# Patient Record
Sex: Male | Born: 1965 | Race: Black or African American | Hispanic: No | Marital: Single | Smoking: Never smoker
Health system: Southern US, Community
[De-identification: ages and names within clinical notes are randomized; demographics above are authoritative.]

## PROBLEM LIST (undated history)

## (undated) DIAGNOSIS — E669 Obesity, unspecified: Secondary | ICD-10-CM

## (undated) DIAGNOSIS — K219 Gastro-esophageal reflux disease without esophagitis: Secondary | ICD-10-CM

## (undated) DIAGNOSIS — I639 Cerebral infarction, unspecified: Secondary | ICD-10-CM

## (undated) DIAGNOSIS — J45909 Unspecified asthma, uncomplicated: Secondary | ICD-10-CM

## (undated) DIAGNOSIS — I10 Essential (primary) hypertension: Secondary | ICD-10-CM

## (undated) DIAGNOSIS — E785 Hyperlipidemia, unspecified: Secondary | ICD-10-CM

## (undated) HISTORY — PX: BRAIN SURGERY: SHX531

---

## 2003-09-20 ENCOUNTER — Other Ambulatory Visit: Payer: Self-pay

## 2005-06-30 ENCOUNTER — Emergency Department: Payer: Self-pay | Admitting: Emergency Medicine

## 2005-11-15 ENCOUNTER — Emergency Department: Payer: Self-pay | Admitting: Emergency Medicine

## 2006-10-03 ENCOUNTER — Emergency Department: Payer: Self-pay | Admitting: Emergency Medicine

## 2006-10-03 ENCOUNTER — Other Ambulatory Visit: Payer: Self-pay

## 2008-09-23 ENCOUNTER — Emergency Department: Payer: Self-pay

## 2008-11-23 ENCOUNTER — Encounter: Payer: Self-pay | Admitting: Internal Medicine

## 2008-12-07 ENCOUNTER — Encounter: Payer: Self-pay | Admitting: Internal Medicine

## 2009-01-06 ENCOUNTER — Encounter: Payer: Self-pay | Admitting: Internal Medicine

## 2010-08-25 ENCOUNTER — Emergency Department: Payer: Self-pay | Admitting: Emergency Medicine

## 2011-06-06 ENCOUNTER — Emergency Department: Payer: Self-pay | Admitting: Emergency Medicine

## 2011-12-20 ENCOUNTER — Emergency Department: Payer: Self-pay | Admitting: Emergency Medicine

## 2012-07-31 ENCOUNTER — Ambulatory Visit: Payer: Self-pay | Admitting: Unknown Physician Specialty

## 2013-01-03 DIAGNOSIS — R7303 Prediabetes: Secondary | ICD-10-CM | POA: Insufficient documentation

## 2013-01-03 DIAGNOSIS — N529 Male erectile dysfunction, unspecified: Secondary | ICD-10-CM | POA: Insufficient documentation

## 2013-09-08 DIAGNOSIS — R221 Localized swelling, mass and lump, neck: Secondary | ICD-10-CM | POA: Insufficient documentation

## 2013-09-08 DIAGNOSIS — H60339 Swimmer's ear, unspecified ear: Secondary | ICD-10-CM | POA: Insufficient documentation

## 2014-01-06 DIAGNOSIS — E119 Type 2 diabetes mellitus without complications: Secondary | ICD-10-CM | POA: Insufficient documentation

## 2014-01-07 DIAGNOSIS — E010 Iodine-deficiency related diffuse (endemic) goiter: Secondary | ICD-10-CM | POA: Insufficient documentation

## 2014-05-04 ENCOUNTER — Ambulatory Visit: Payer: Self-pay | Admitting: Emergency Medicine

## 2014-06-13 DIAGNOSIS — I6930 Unspecified sequelae of cerebral infarction: Secondary | ICD-10-CM | POA: Insufficient documentation

## 2014-06-13 DIAGNOSIS — E782 Mixed hyperlipidemia: Secondary | ICD-10-CM | POA: Insufficient documentation

## 2014-06-13 DIAGNOSIS — I10 Essential (primary) hypertension: Secondary | ICD-10-CM | POA: Insufficient documentation

## 2014-08-03 DIAGNOSIS — W19XXXA Unspecified fall, initial encounter: Secondary | ICD-10-CM | POA: Insufficient documentation

## 2014-11-24 DIAGNOSIS — K219 Gastro-esophageal reflux disease without esophagitis: Secondary | ICD-10-CM | POA: Insufficient documentation

## 2014-11-24 DIAGNOSIS — D496 Neoplasm of unspecified behavior of brain: Secondary | ICD-10-CM | POA: Insufficient documentation

## 2015-01-03 ENCOUNTER — Emergency Department: Payer: Medicare HMO

## 2015-01-03 ENCOUNTER — Emergency Department
Admission: EM | Admit: 2015-01-03 | Discharge: 2015-01-03 | Disposition: A | Discharge status: Home / Self Care | Payer: Medicare HMO | Attending: Emergency Medicine | Admitting: Emergency Medicine

## 2015-01-03 DIAGNOSIS — Y939 Activity, unspecified: Secondary | ICD-10-CM | POA: Insufficient documentation

## 2015-01-03 DIAGNOSIS — Y999 Unspecified external cause status: Secondary | ICD-10-CM | POA: Insufficient documentation

## 2015-01-03 DIAGNOSIS — X58XXXA Exposure to other specified factors, initial encounter: Secondary | ICD-10-CM | POA: Insufficient documentation

## 2015-01-03 DIAGNOSIS — I10 Essential (primary) hypertension: Secondary | ICD-10-CM | POA: Diagnosis not present

## 2015-01-03 DIAGNOSIS — S39012A Strain of muscle, fascia and tendon of lower back, initial encounter: Secondary | ICD-10-CM | POA: Diagnosis not present

## 2015-01-03 DIAGNOSIS — M545 Low back pain: Secondary | ICD-10-CM | POA: Diagnosis present

## 2015-01-03 DIAGNOSIS — Y929 Unspecified place or not applicable: Secondary | ICD-10-CM | POA: Diagnosis not present

## 2015-01-03 LAB — URINALYSIS COMPLETE WITH MICROSCOPIC (ARMC ONLY)
Bacteria, UA: NONE SEEN
Bilirubin Urine: NEGATIVE
Glucose, UA: NEGATIVE mg/dL
Hgb urine dipstick: NEGATIVE
Ketones, ur: NEGATIVE mg/dL
Leukocytes, UA: NEGATIVE
Nitrite: NEGATIVE
Protein, ur: NEGATIVE mg/dL
Specific Gravity, Urine: 1.016 (ref 1.005–1.030)
pH: 7 (ref 5.0–8.0)

## 2015-01-03 MED ORDER — CYCLOBENZAPRINE HCL 10 MG PO TABS: 10.0000 mg | ORAL_TABLET | Freq: Three times a day (TID) | ORAL | Status: AC | PRN

## 2015-01-03 MED ORDER — TRAMADOL HCL 50 MG PO TABS: 50.0000 mg | ORAL_TABLET | Freq: Four times a day (QID) | ORAL | Status: DC | PRN

## 2015-01-03 MED ORDER — TRAMADOL HCL 50 MG PO TABS
50.0000 mg | ORAL_TABLET | Freq: Once | ORAL | Status: AC
  Administered 2015-01-03: 50 mg via ORAL
  Filled 2015-01-03: qty 1

## 2015-01-03 MED ORDER — CYCLOBENZAPRINE HCL 10 MG PO TABS
10.0000 mg | ORAL_TABLET | Freq: Once | ORAL | Status: AC
  Administered 2015-01-03: 10 mg via ORAL
  Filled 2015-01-03: qty 1

## 2015-01-03 NOTE — ED Provider Notes (Signed)
[facility] Emergency Department Provider Note  ____________________________________________  Time seen: Approximately 1:35 PM  I have reviewed the triage vital signs and the nursing notes.   HISTORY  Chief Complaint Back Pain    HPI Curtis Powell is a 49 y.o. male complain of low back pain for 3 days. Patient also complaining of right flank pain. Patient denies any bladder or bowel dysfunction. He denies any radicular component to this pain. The patient denies any injury. Patient rating his pain as 8/10 described as sharp. No palliative measures taken for this complaint.   History reviewed. No pertinent past medical history.  There are no active problems to display for this patient.   Past Surgical History  Procedure Laterality Date  . Brain surgery      Current Outpatient Rx  Name  Route  Sig  Dispense  Refill  . cyclobenzaprine (FLEXERIL) 10 MG tablet   Oral   Take 1 tablet (10 mg total) by mouth every 8 (eight) hours as needed for muscle spasms.   15 tablet   0   . traMADol (ULTRAM) 50 MG tablet   Oral   Take 1 tablet (50 mg total) by mouth every 6 (six) hours as needed for moderate pain.   12 tablet   0     Allergies Review of patient's allergies indicates no known allergies.  No family history on file.  Social History Social History  Substance Use Topics  . Smoking status: Never Smoker   . Smokeless tobacco: Never Used  . Alcohol Use: No    Review of Systems Constitutional: No fever/chills Eyes: No visual changes. ENT: No sore throat. Cardiovascular: Denies chest pain. Respiratory: Denies shortness of breath. Gastrointestinal: No abdominal pain.  No nausea, no vomiting.  No diarrhea.  No constipation. Genitourinary: Negative for dysuria. Musculoskeletal: Positive for back pain. Skin: Negative for rash. Neurological: Negative for headaches, focal weakness or numbness. Endocrine:Hypertension  10-point ROS otherwise  negative.  ____________________________________________   PHYSICAL EXAM:  VITAL SIGNS: ED Triage Vitals  Enc Vitals Group     BP 01/03/15 1309 130/76 mmHg     Pulse Rate 01/03/15 1309 88     Resp 01/03/15 1309 16     Temp 01/03/15 1309 98.7 F (37.1 C)     Temp Source 01/03/15 1309 Oral     SpO2 01/03/15 1309 96 %     Weight 01/03/15 1309 196 lb (88.905 kg)     Height 01/03/15 1309 4\' 11"  (1.499 m)     Head Cir --      Peak Flow --      Pain Score 01/03/15 1309 8     Pain Loc --      Pain Edu? --      Excl. in [place]? --     Constitutional: Alert and oriented. Well appearing and in no acute distress. Eyes: Conjunctivae are normal. PERRL. EOMI. Head: Atraumatic. Nose: No congestion/rhinnorhea. Mouth/Throat: Mucous membranes are moist.  Oropharynx non-erythematous. Neck: No stridor.   Hematological/Lymphatic/Immunilogical: No cervical lymphadenopathy. Cardiovascular: Normal rate, regular rhythm. Grossly normal heart sounds.  Good peripheral circulation. Respiratory: Normal respiratory effort.  No retractions. Lungs CTAB. Gastrointestinal: Soft and nontender. No distention. No abdominal bruits. No CVA tenderness. Genitourinary:  **}Musculoskeletal: No lower extremity tenderness nor edema.  No joint effusions. Neurologic:  Normal speech and language. No gross focal neurologic deficits are appreciated. No gait instability. Skin:  Skin is warm, dry and intact. No rash noted. Psychiatric: Mood and  affect are normal. Speech and behavior are normal.  ____________________________________________   LABS (all labs ordered are listed, but only abnormal results are displayed)  Labs Reviewed  URINALYSIS COMPLETEWITH MICROSCOPIC ([facility] ONLY) - Abnormal; Notable for the following:    Color, Urine AMBER (*)    APPearance HAZY (*)    Squamous Epithelial / LPF 0-5 (*)    All other components within normal limits    ____________________________________________  EKG   ____________________________________________  RADIOLOGY degenerative findings throughout the L-spine. No other acute findings. I, Sable Feil, personally viewed and evaluated these images (plain radiographs) as part of my medical decision making.   ____________________________________________   PROCEDURES  Procedure(s) performed: None  Critical Care performed: No  ____________________________________________   INITIAL IMPRESSION / ASSESSMENT AND PLAN / ED COURSE  Pertinent labs & imaging results that were available during my care of the patient were reviewed by me and considered in my medical decision making (see chart for details). Discussed x-ray findings with the patient. Lumbar strain. Patient given a prescription for Flexeril, ibuprofen and tramadol. Patient advised to follow-up with family doctor for continued care. Return back if his condition worsens ____________________________________________   FINAL CLINICAL IMPRESSION(S) / ED DIAGNOSES  Final diagnoses:  Lumbar strain, initial encounter      Sable Feil, PA-C 01/03/15 [place], MD 01/03/15 1600

## 2015-01-03 NOTE — ED Notes (Signed)
Pt comes into the ED via EMS from home with c/o right lower back pain, when asked pt points to lower back, for the past 3-4 days..states he has been lifting some wts.

## 2015-01-03 NOTE — ED Notes (Signed)
Developed lower back pain  Mainly on right side at hip area .non radiating  .denies any injury

## 2016-05-01 ENCOUNTER — Inpatient Hospital Stay
Admission: EM | Admit: 2016-05-01 | Discharge: 2016-05-03 | DRG: 065 | Disposition: A | Discharge status: Skilled Nursing Facility | Payer: Medicare Other | Attending: Internal Medicine | Admitting: Internal Medicine

## 2016-05-01 ENCOUNTER — Encounter: Payer: Self-pay | Admitting: Emergency Medicine

## 2016-05-01 ENCOUNTER — Emergency Department: Payer: Medicare Other

## 2016-05-01 ENCOUNTER — Observation Stay (HOSPITAL_BASED_OUTPATIENT_CLINIC_OR_DEPARTMENT_OTHER)
Admit: 2016-05-01 | Discharge: 2016-05-01 | Disposition: A | Discharge status: Home / Self Care | Payer: Medicare Other | Attending: Internal Medicine | Admitting: Internal Medicine

## 2016-05-01 DIAGNOSIS — E669 Obesity, unspecified: Secondary | ICD-10-CM | POA: Diagnosis present

## 2016-05-01 DIAGNOSIS — I639 Cerebral infarction, unspecified: Secondary | ICD-10-CM | POA: Diagnosis not present

## 2016-05-01 DIAGNOSIS — Z8249 Family history of ischemic heart disease and other diseases of the circulatory system: Secondary | ICD-10-CM | POA: Diagnosis not present

## 2016-05-01 DIAGNOSIS — R531 Weakness: Secondary | ICD-10-CM

## 2016-05-01 DIAGNOSIS — E785 Hyperlipidemia, unspecified: Secondary | ICD-10-CM | POA: Diagnosis present

## 2016-05-01 DIAGNOSIS — R29898 Other symptoms and signs involving the musculoskeletal system: Secondary | ICD-10-CM | POA: Diagnosis present

## 2016-05-01 DIAGNOSIS — I10 Essential (primary) hypertension: Secondary | ICD-10-CM | POA: Diagnosis present

## 2016-05-01 DIAGNOSIS — G459 Transient cerebral ischemic attack, unspecified: Secondary | ICD-10-CM | POA: Diagnosis not present

## 2016-05-01 DIAGNOSIS — I69351 Hemiplegia and hemiparesis following cerebral infarction affecting right dominant side: Secondary | ICD-10-CM

## 2016-05-01 DIAGNOSIS — Z823 Family history of stroke: Secondary | ICD-10-CM

## 2016-05-01 DIAGNOSIS — Z7982 Long term (current) use of aspirin: Secondary | ICD-10-CM | POA: Diagnosis not present

## 2016-05-01 DIAGNOSIS — Z6841 Body Mass Index (BMI) 40.0 and over, adult: Secondary | ICD-10-CM | POA: Diagnosis not present

## 2016-05-01 DIAGNOSIS — R1310 Dysphagia, unspecified: Secondary | ICD-10-CM | POA: Diagnosis present

## 2016-05-01 DIAGNOSIS — R262 Difficulty in walking, not elsewhere classified: Secondary | ICD-10-CM

## 2016-05-01 DIAGNOSIS — K219 Gastro-esophageal reflux disease without esophagitis: Secondary | ICD-10-CM | POA: Diagnosis not present

## 2016-05-01 DIAGNOSIS — R2681 Unsteadiness on feet: Secondary | ICD-10-CM

## 2016-05-01 DIAGNOSIS — R29701 NIHSS score 1: Secondary | ICD-10-CM | POA: Diagnosis not present

## 2016-05-01 DIAGNOSIS — Z8673 Personal history of transient ischemic attack (TIA), and cerebral infarction without residual deficits: Secondary | ICD-10-CM | POA: Diagnosis not present

## 2016-05-01 DIAGNOSIS — R471 Dysarthria and anarthria: Secondary | ICD-10-CM | POA: Diagnosis present

## 2016-05-01 DIAGNOSIS — G8194 Hemiplegia, unspecified affecting left nondominant side: Secondary | ICD-10-CM | POA: Diagnosis present

## 2016-05-01 HISTORY — DX: Hyperlipidemia, unspecified: E78.5

## 2016-05-01 HISTORY — DX: Cerebral infarction, unspecified: I63.9

## 2016-05-01 HISTORY — DX: Obesity, unspecified: E66.9

## 2016-05-01 HISTORY — DX: Gastro-esophageal reflux disease without esophagitis: K21.9

## 2016-05-01 HISTORY — DX: Essential (primary) hypertension: I10

## 2016-05-01 LAB — URINALYSIS, COMPLETE (UACMP) WITH MICROSCOPIC
Bacteria, UA: NONE SEEN
Bilirubin Urine: NEGATIVE
Glucose, UA: NEGATIVE mg/dL
Hgb urine dipstick: NEGATIVE
Ketones, ur: 5 mg/dL — AB
Leukocytes, UA: NEGATIVE
Nitrite: NEGATIVE
Protein, ur: NEGATIVE mg/dL
Specific Gravity, Urine: 1.021 (ref 1.005–1.030)
Squamous Epithelial / LPF: NONE SEEN
pH: 6 (ref 5.0–8.0)

## 2016-05-01 LAB — CBC
HCT: 43.2 % (ref 40.0–52.0)
Hemoglobin: 14.3 g/dL (ref 13.0–18.0)
MCH: 27 pg (ref 26.0–34.0)
MCHC: 33.1 g/dL (ref 32.0–36.0)
MCV: 81.5 fL (ref 80.0–100.0)
Platelets: 205 10*3/uL (ref 150–440)
RBC: 5.3 MIL/uL (ref 4.40–5.90)
RDW: 16.3 % — ABNORMAL HIGH (ref 11.5–14.5)
WBC: 8.9 10*3/uL (ref 3.8–10.6)

## 2016-05-01 LAB — BASIC METABOLIC PANEL
Anion gap: 5 (ref 5–15)
BUN: 14 mg/dL (ref 6–20)
CO2: 28 mmol/L (ref 22–32)
Calcium: 9.4 mg/dL (ref 8.9–10.3)
Chloride: 106 mmol/L (ref 101–111)
Creatinine, Ser: 0.99 mg/dL (ref 0.61–1.24)
GFR calc Af Amer: >60 mL/min (ref >60)
GFR calc non Af Amer: >60 mL/min (ref >60)
Glucose, Bld: 94 mg/dL (ref 65–99)
Potassium: 4 mmol/L (ref 3.5–5.1)
Sodium: 139 mmol/L (ref 135–145)

## 2016-05-01 LAB — GLUCOSE, CAPILLARY: Glucose-Capillary: 80 mg/dL (ref 65–99)

## 2016-05-01 LAB — TROPONIN I: Troponin I: <0.03 ng/mL (ref <0.03)

## 2016-05-01 MED ORDER — ASPIRIN 300 MG RE SUPP: 300.0000 mg | Freq: Every day | RECTAL | Status: DC

## 2016-05-01 MED ORDER — ATORVASTATIN CALCIUM 20 MG PO TABS
40.0000 mg | ORAL_TABLET | Freq: Every day | ORAL | Status: DC
  Administered 2016-05-01 – 2016-05-02 (×2): 40 mg via ORAL
  Filled 2016-05-01 (×2): qty 2

## 2016-05-01 MED ORDER — ACETAMINOPHEN 325 MG PO TABS
650.0000 mg | ORAL_TABLET | ORAL | Status: DC | PRN
  Administered 2016-05-02: 650 mg via ORAL
  Filled 2016-05-01: qty 2

## 2016-05-01 MED ORDER — ENOXAPARIN SODIUM 40 MG/0.4ML ~~LOC~~ SOLN
40.0000 mg | SUBCUTANEOUS | Status: DC
Start: 2016-05-01 — End: 2016-05-02
  Administered 2016-05-01: 40 mg via SUBCUTANEOUS
  Filled 2016-05-01: qty 0.4

## 2016-05-01 MED ORDER — SENNOSIDES-DOCUSATE SODIUM 8.6-50 MG PO TABS: 1.0000 | ORAL_TABLET | Freq: Every evening | ORAL | Status: DC | PRN

## 2016-05-01 MED ORDER — STROKE: EARLY STAGES OF RECOVERY BOOK
Freq: Once | Status: AC
  Administered 2016-05-01: 21:00:00

## 2016-05-01 MED ORDER — ASPIRIN 81 MG PO CHEW
81.0000 mg | CHEWABLE_TABLET | Freq: Once | ORAL | Status: AC
  Administered 2016-05-01: 81 mg via ORAL
  Filled 2016-05-01: qty 1

## 2016-05-01 MED ORDER — SODIUM CHLORIDE 0.9 % IV SOLN
INTRAVENOUS | Status: DC
  Administered 2016-05-01 – 2016-05-02 (×2): via INTRAVENOUS

## 2016-05-01 MED ORDER — ACETAMINOPHEN 160 MG/5ML PO SOLN: 650.0000 mg | ORAL | Status: DC | PRN

## 2016-05-01 MED ORDER — ACETAMINOPHEN 650 MG RE SUPP: 650.0000 mg | RECTAL | Status: DC | PRN

## 2016-05-01 MED ORDER — PANTOPRAZOLE SODIUM 40 MG PO TBEC
40.0000 mg | DELAYED_RELEASE_TABLET | Freq: Every day | ORAL | Status: DC
  Administered 2016-05-02: 40 mg via ORAL
  Filled 2016-05-01 (×2): qty 1

## 2016-05-01 MED ORDER — ASPIRIN 325 MG PO TABS
325.0000 mg | ORAL_TABLET | Freq: Every day | ORAL | Status: DC
  Administered 2016-05-02 – 2016-05-03 (×2): 325 mg via ORAL
  Filled 2016-05-01 (×2): qty 1

## 2016-05-01 NOTE — ED Triage Notes (Signed)
Pt to ED via EMS from home, states increased weakness and lower back pain radiating into groin area. Pt denies any dysuria. Pt A&Ox4, VS stable.

## 2016-05-01 NOTE — ED Provider Notes (Signed)
[facility] Emergency Department Provider Note ____________________________________________   I have reviewed the triage vital signs and the triage nursing note.  HISTORY  Chief Complaint Back Pain and Weakness   Historian Patient and his mother  HPI Curtis Powell is a 51 y.o. 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 Noticed the day before yesterday.  Last week patient had a one-year follow-up MRI of the brain, however he was having normal neurologic exam at that point in time.  No other recent illnesses in terms of no fever, dizziness, passing out, altered mental status, abdominal pain, vomiting.  Mother did feel like he was having some episodes of slurred speech yesterday, but seems to be improved or resolved now.    Past Medical History:  Diagnosis Date  . GERD (gastroesophageal reflux disease)   . Hypertension   . Stroke [facility])     There are no active problems to display for this patient.   Past Surgical History:  Procedure Laterality Date  . BRAIN SURGERY      Prior to Admission medications   Medication Sig Start Date End Date Taking? Authorizing Provider  aspirin EC 81 MG tablet Take 81 mg by mouth daily.   Yes Historical Provider, MD  hydrochlorothiazide (HYDRODIURIL) 12.5 MG tablet Take 12.5 mg by mouth daily.    Yes Historical Provider, MD  lisinopril (PRINIVIL,ZESTRIL) 5 MG tablet Take 5 mg by mouth daily. 03/04/16  Yes Historical Provider, MD  multivitamin-iron-minerals-folic acid (CENTRUM) chewable tablet Chew 1 tablet by mouth daily.   Yes Historical Provider, MD  omeprazole (PRILOSEC) 20 MG capsule Take 20 mg by mouth daily. 01/09/15  Yes Historical Provider, MD  cyclobenzaprine (FLEXERIL) 10 MG tablet Take 1 tablet (10 mg total) by mouth  every 8 (eight) hours as needed for muscle spasms. Patient not taking: Reported on 05/01/2016 01/03/15   Sable Feil, PA-C  traMADol (ULTRAM) 50 MG tablet Take 1 tablet (50 mg total) by mouth every 6 (six) hours as needed for moderate pain. Patient not taking: Reported on 05/01/2016 01/03/15   Sable Feil, PA-C    No Known Allergies  No family history on file.  Social History Social History  Substance Use Topics  . Smoking status: Never Smoker  . Smokeless tobacco: Never Used  . Alcohol use No    Review of Systems  Constitutional: Negative for fever. Eyes: Negative for visual changes. ENT: Negative for sore throat. Cardiovascular: Negative for chest pain. Respiratory: Negative for shortness of breath. Gastrointestinal: Negative for abdominal pain, vomiting and diarrhea. Genitourinary: Negative for dysuria. Musculoskeletal: Negative for back pain. Skin: Negative for rash. Neurological: Negative for headache. 10 point Review of Systems otherwise negative ____________________________________________   PHYSICAL EXAM:  VITAL SIGNS: ED Triage Vitals  Enc Vitals Group     BP 05/01/16 1137 103/60     Pulse Rate 05/01/16 1137 64     Resp 05/01/16 1137 14     Temp 05/01/16 1137 98.1 F (36.7 C)     Temp Source 05/01/16 1137 Oral     SpO2 05/01/16 1137 99 %     Weight 05/01/16 1138 240 lb (108.9 kg)     Height 05/01/16 1138 5' (1.524 m)     Head Circumference --  Peak Flow --      Pain Score 05/01/16 1138 2     Pain Loc --      Pain Edu? --      Excl. in [place]? --      Constitutional: Alert and Cooperative. Well appearing and in no distress.  Slightly slow to answer questions, but appropriate thirst. HEENT   Head: Chronic-appearing skull shape neuro and long but atraumatic.      Eyes: Conjunctivae are normal. PERRL. Normal extraocular movements.      Ears:         Nose: No congestion/rhinnorhea.   Mouth/Throat: Mucous membranes are moist.   Neck: No  stridor. Cardiovascular/Chest: Normal rate, regular rhythm.  No murmurs, rubs, or gallops. Respiratory: Normal respiratory effort without tachypnea nor retractions. Breath sounds are clear and equal bilaterally. No wheezes/rales/rhonchi. Gastrointestinal: Soft. No distention, no guarding, no rebound. Nontender.   Genitourinary/rectal:Deferred Musculoskeletal: Nontender with normal range of motion in all extremities. No joint effusions.  No lower extremity tenderness.  No edema. Neurologic:  No facial droop. Normal speech and language. Mild right decreased grip strength, 4-5. 5 out of 5 left upper extremity strength.  4-5 left lower extremity strength. 5 out of 5 right lower extremity strength. No sensory deficit. Skin:  Skin is warm, dry and intact. No rash noted. Psychiatric: Mood and affect are normal. Speech and behavior are normal. Patient exhibits appropriate insight and judgment.   ____________________________________________  LABS (pertinent positives/negatives)  Labs Reviewed  CBC - Abnormal; Notable for the following:       Result Value   RDW 16.3 (*)    All other components within normal limits  URINALYSIS, COMPLETE (UACMP) WITH MICROSCOPIC - Abnormal; Notable for the following:    Color, Urine YELLOW (*)    APPearance CLEAR (*)    Ketones, ur 5 (*)    All other components within normal limits  BASIC METABOLIC PANEL  TROPONIN I  CBG MONITORING, ED    ____________________________________________    EKG I, Lisa Roca, MD, the attending physician have personally viewed and interpreted all ECGs.  69 bpm. Normal sinus rhythm. Narrow QRS. Normal axis. Normal ST and T-wave ____________________________________________  RADIOLOGY All Xrays were viewed by me. Imaging interpreted by Radiologist.  CT head without contrast:  IMPRESSION: Mild diffuse cortical atrophy. Mild chronic ischemic white matter disease. No acute intracranial abnormality seen.  Stable cortical  calcifications seen in the cerebellum and both occipital lobes which may represent chronic sequela from previous infection. __________________________________________  PROCEDURES  Procedure(s) performed: None  Critical Care performed: None  ____________________________________________   ED COURSE / ASSESSMENT AND PLAN  Pertinent labs & imaging results that were available during my care of the patient were reviewed by me and considered in my medical decision making (see chart for details).   Mr. Zavatsky has a history of both prior brain surgery as a child, status post radiation and the child, as well as prior stroke.  New onset neurologic deficit, affecting his ability to walk, left lower extremity weakness for approximately a day and a half. Patient is not a TPA due to time of time of onset.  Does not appear to have any other acute medical conditions including infection or dehydration.  I don't appreciate aphasia, he is a little bit slow to answer questions, mother seems to state that this while observing now is baseline.  Patient is waiting his head CT, but if nonacute will need hospital admission/neuro eval/MRI brain for further evaluation due  to new neurologic deficit.  Reviewed CT head result without acute abnormality. Patient will be admitted for further neurologic workup.  CONSULTATIONS:  Hospitalist for admission.   Patient / Family / Caregiver informed of clinical course, medical decision-making process, and agree with plan.    ___________________________________________   FINAL CLINICAL IMPRESSION(S) / ED DIAGNOSES   Final diagnoses:  Weakness of left leg              Note: This dictation was prepared with Dragon dictation. Any transcriptional errors that result from this process are unintentional    Lisa Roca, MD 05/01/16 1540

## 2016-05-01 NOTE — ED Notes (Signed)
Lab at bedside

## 2016-05-01 NOTE — H&P (Signed)
[place] [facility] [place] NAME: Curtis Powell    MR#:  [id number]  DATE OF BIRTH:  [birth year]  DATE OF ADMISSION:  05/01/2016  PRIMARY CARE PHYSICIAN: No PCP Per Patient   REQUESTING/REFERRING PHYSICIAN: Lisa Roca, MD  CHIEF COMPLAINT:   Chief Complaint  Patient presents with  . Back Pain  . Weakness   Left leg weakness and slurred speech a couple days. HISTORY OF PRESENT ILLNESS:  Curtis Powell  is a 51 y.o. male with a known history of CVA, hypertension, hyperlipidemia and GERD. The patient has a history of brain surgery in childhood and stroke later. He presented to the ED with left leg weakness and slurred speech. The patient denies any other symptoms. CAT scan of head is negative. PAST MEDICAL HISTORY:   Past Medical History:  Diagnosis Date  . GERD (gastroesophageal reflux disease)   . Hyperlipidemia   . Hypertension   . Obesity   . Stroke [facility])     PAST SURGICAL HISTORY:   Past Surgical History:  Procedure Laterality Date  . BRAIN SURGERY      SOCIAL HISTORY:   Social History  Substance Use Topics  . Smoking status: Never Smoker  . Smokeless tobacco: Never Used  . Alcohol use No    FAMILY HISTORY:   Family History  Problem Relation Age of Onset  . Stroke Mother   . Hypertension Mother     DRUG ALLERGIES:  No Known Allergies  REVIEW OF SYSTEMS:   Review of Systems  Constitutional: Negative for chills, fever and malaise/fatigue.  HENT: Negative for congestion.   Eyes: Negative for blurred vision and double vision.  Respiratory: Negative for cough, shortness of breath and stridor.   Cardiovascular: Negative for chest pain, palpitations and leg swelling.  Gastrointestinal: Negative for abdominal pain, blood in stool, diarrhea, melena, nausea and vomiting.  Genitourinary: Negative for dysuria and frequency.  Musculoskeletal: Negative for back pain and joint pain.  Skin: Negative for itching and rash.    Neurological: Positive for speech change, focal weakness and weakness. Negative for dizziness, tingling, sensory change, seizures, loss of consciousness and headaches.  Psychiatric/Behavioral: Negative for depression. The patient is not nervous/anxious.     MEDICATIONS AT HOME:   Prior to Admission medications   Medication Sig Start Date End Date Taking? Authorizing Provider  aspirin EC 81 MG tablet Take 81 mg by mouth daily.   Yes Historical Provider, MD  hydrochlorothiazide (HYDRODIURIL) 12.5 MG tablet Take 12.5 mg by mouth daily.    Yes Historical Provider, MD  lisinopril (PRINIVIL,ZESTRIL) 5 MG tablet Take 5 mg by mouth daily. 03/04/16  Yes Historical Provider, MD  multivitamin-iron-minerals-folic acid (CENTRUM) chewable tablet Chew 1 tablet by mouth daily.   Yes Historical Provider, MD  omeprazole (PRILOSEC) 20 MG capsule Take 20 mg by mouth daily. 01/09/15  Yes Historical Provider, MD  cyclobenzaprine (FLEXERIL) 10 MG tablet Take 1 tablet (10 mg total) by mouth every 8 (eight) hours as needed for muscle spasms. Patient not taking: Reported on 05/01/2016 01/03/15   Sable Feil, PA-C  traMADol (ULTRAM) 50 MG tablet Take 1 tablet (50 mg total) by mouth every 6 (six) hours as needed for moderate pain. Patient not taking: Reported on 05/01/2016 01/03/15   Sable Feil, PA-C      VITAL SIGNS:  Blood pressure 103/60, pulse 64, temperature 98.1 F (36.7 C), temperature source Oral, resp. rate 14, height 5' (1.524 m), weight 240 lb (108.9  kg), SpO2 99 %.  PHYSICAL EXAMINATION:  Physical Exam  GENERAL:  51 y.o.-year-old patient lying in the bed with no acute distress. Obese. EYES: Pupils equal, round, reactive to light and accommodation. No scleral icterus. Extraocular muscles intact.  HEENT: Head atraumatic, normocephalic. Oropharynx and nasopharynx clear.  NECK:  Supple, no jugular venous distention. No thyroid enlargement, no tenderness.  LUNGS: Normal breath sounds bilaterally, no  wheezing, rales,rhonchi or crepitation. No use of accessory muscles of respiration.  CARDIOVASCULAR: S1, S2 normal. No murmurs, rubs, or gallops.  ABDOMEN: Soft, nontender, nondistended. Bowel sounds present. No organomegaly or mass.  EXTREMITIES: No pedal edema, cyanosis, or clubbing.  NEUROLOGIC: Cranial nerves II through XII are intact except mild slurred speech. Muscle strength 5/5 in all extremities. Sensation intact. Gait not checked.  PSYCHIATRIC: The patient is alert and oriented x 3.  SKIN: No obvious rash, lesion, or ulcer.   LABORATORY PANEL:   CBC  Recent Labs Lab 05/01/16 1215  WBC 8.9  HGB 14.3  HCT 43.2  PLT 205   ------------------------------------------------------------------------------------------------------------------  Chemistries   Recent Labs Lab 05/01/16 1215  NA 139  K 4.0  CL 106  CO2 28  GLUCOSE 94  BUN 14  CREATININE 0.99  CALCIUM 9.4   ------------------------------------------------------------------------------------------------------------------  Cardiac Enzymes  Recent Labs Lab 05/01/16 1215  TROPONINI <0.03   ------------------------------------------------------------------------------------------------------------------  RADIOLOGY:  Ct Head Wo Contrast  Result Date: 05/01/2016 CLINICAL DATA:  Slurred speech, left lower extremity weakness. EXAM: CT HEAD WITHOUT CONTRAST TECHNIQUE: Contiguous axial images were obtained from the base of the skull through the vertex without intravenous contrast. COMPARISON:  CT scan of June 06, 2011. FINDINGS: Brain: Mild diffuse cortical atrophy is noted. Mild chronic ischemic white matter disease is noted. Stable cortical calcifications are noted in the cerebellum and both occipital lobes, as well small amount seen in left frontal lobe. This may represent chronic sequela from previous remote infection. No mass effect or midline shift is noted. Ventricular size is within normal limits. There is  no evidence of mass lesion, hemorrhage or acute infarction. Vascular: Atherosclerosis of carotid siphons is noted. Skull: Normal. Negative for fracture or focal lesion. Sinuses/Orbits: Stable left sphenoid mucous retention cyst. Other: None. IMPRESSION: Mild diffuse cortical atrophy. Mild chronic ischemic white matter disease. No acute intracranial abnormality seen. Stable cortical calcifications seen in the cerebellum and both occipital lobes which may represent chronic sequela from previous infection. Electronically Signed   By: Marijo Conception, M.D.   On: 05/01/2016 15:34      IMPRESSION AND PLAN:   Left-sided weakness and dysarthria, rule out CVA The patient will be placed for observation. Start aspirin 325 mg daily, Lipitor, MRI/MRA of brain, echocardiogram and carotid duplex. PT, OT and speech study.  Hypertension. Hold HCTZ and lisinopril due to low side blood pressure. Hyperlipidemia. Continue Lipitor. Obesity. GERD. Protonix. All the records are reviewed and case discussed with ED provider. Management plans discussed with the patient, his mother and they are in agreement.  CODE STATUS: Full code  TOTAL TIME TAKING CARE OF THIS PATIENT: 48 minutes.    Demetrios Loll M.D on 05/01/2016 at 4:26 PM  Between 7am to 6pm - Pager - [phone]  After 6pm go to [email or web address] - [employer] [email or web address] [facility]  Office  [phone]  CC: Primary care physician; No PCP Per Patient   Note: This dictation was prepared with Dragon dictation along with smaller phrase technology. Any transcriptional errors that result from this process are  unintentional. 

## 2016-05-01 NOTE — ED Notes (Addendum)
Per family, pt has hx of TIA's, family has noticed increased weakness and worse than normal slurred speech the past few days. Pt A&OX4, MD notified

## 2016-05-01 NOTE — ED Notes (Signed)
Lab called for blood draw.

## 2016-05-02 ENCOUNTER — Observation Stay: Payer: Medicare Other

## 2016-05-02 DIAGNOSIS — I639 Cerebral infarction, unspecified: Secondary | ICD-10-CM | POA: Diagnosis present

## 2016-05-02 DIAGNOSIS — Z8673 Personal history of transient ischemic attack (TIA), and cerebral infarction without residual deficits: Secondary | ICD-10-CM | POA: Diagnosis not present

## 2016-05-02 DIAGNOSIS — E669 Obesity, unspecified: Secondary | ICD-10-CM | POA: Diagnosis present

## 2016-05-02 DIAGNOSIS — Z823 Family history of stroke: Secondary | ICD-10-CM | POA: Diagnosis not present

## 2016-05-02 DIAGNOSIS — E785 Hyperlipidemia, unspecified: Secondary | ICD-10-CM | POA: Diagnosis present

## 2016-05-02 DIAGNOSIS — R471 Dysarthria and anarthria: Secondary | ICD-10-CM | POA: Diagnosis present

## 2016-05-02 DIAGNOSIS — R29898 Other symptoms and signs involving the musculoskeletal system: Secondary | ICD-10-CM | POA: Diagnosis present

## 2016-05-02 DIAGNOSIS — I10 Essential (primary) hypertension: Secondary | ICD-10-CM | POA: Diagnosis present

## 2016-05-02 DIAGNOSIS — Z8249 Family history of ischemic heart disease and other diseases of the circulatory system: Secondary | ICD-10-CM | POA: Diagnosis not present

## 2016-05-02 DIAGNOSIS — Z6841 Body Mass Index (BMI) 40.0 and over, adult: Secondary | ICD-10-CM | POA: Diagnosis not present

## 2016-05-02 DIAGNOSIS — Z7982 Long term (current) use of aspirin: Secondary | ICD-10-CM | POA: Diagnosis not present

## 2016-05-02 DIAGNOSIS — R1310 Dysphagia, unspecified: Secondary | ICD-10-CM | POA: Diagnosis present

## 2016-05-02 DIAGNOSIS — R29701 NIHSS score 1: Secondary | ICD-10-CM | POA: Diagnosis present

## 2016-05-02 DIAGNOSIS — G8194 Hemiplegia, unspecified affecting left nondominant side: Secondary | ICD-10-CM | POA: Diagnosis present

## 2016-05-02 DIAGNOSIS — K219 Gastro-esophageal reflux disease without esophagitis: Secondary | ICD-10-CM | POA: Diagnosis present

## 2016-05-02 LAB — ANTITHROMBIN III: AntiThromb III Func: 99 % (ref 75–120)

## 2016-05-02 LAB — ECHOCARDIOGRAM COMPLETE
Height: 60 in
Weight: 3840 oz

## 2016-05-02 LAB — LIPID PANEL
Cholesterol: 225 mg/dL — ABNORMAL HIGH (ref 0–200)
HDL: 41 mg/dL (ref >40)
LDL Cholesterol: 167 mg/dL — ABNORMAL HIGH (ref 0–99)
Total CHOL/HDL Ratio: 5.5 RATIO
Triglycerides: 86 mg/dL (ref <150)
VLDL: 17 mg/dL (ref 0–40)

## 2016-05-02 LAB — APTT: aPTT: 34 seconds (ref 24–36)

## 2016-05-02 LAB — PROTIME-INR
INR: 0.98
Prothrombin Time: 13 seconds (ref 11.4–15.2)

## 2016-05-02 LAB — SEDIMENTATION RATE: Sed Rate: 5 mm/hr (ref 0–15)

## 2016-05-02 MED ORDER — OXYCODONE-ACETAMINOPHEN 5-325 MG PO TABS
1.0000 | ORAL_TABLET | Freq: Four times a day (QID) | ORAL | Status: DC | PRN
  Administered 2016-05-02 – 2016-05-03 (×2): 1 via ORAL
  Filled 2016-05-02 (×2): qty 1

## 2016-05-02 MED ORDER — ENOXAPARIN SODIUM 40 MG/0.4ML ~~LOC~~ SOLN
40.0000 mg | Freq: Two times a day (BID) | SUBCUTANEOUS | Status: DC
  Administered 2016-05-02 – 2016-05-03 (×3): 40 mg via SUBCUTANEOUS
  Filled 2016-05-02 (×3): qty 0.4

## 2016-05-02 MED ORDER — OXYCODONE-ACETAMINOPHEN 5-325 MG PO TABS
1.0000 | ORAL_TABLET | Freq: Four times a day (QID) | ORAL | Status: DC | PRN
  Administered 2016-05-02: 05:00:00 1 via ORAL
  Filled 2016-05-02: qty 1

## 2016-05-02 NOTE — Progress Notes (Signed)
PT Cancellation Note  Patient Details Name: Curtis Powell MRN: [id number] DOB: 1966-03-15   Cancelled Treatment:    Reason Eval/Treat Not Completed: Other (comment). Consult received and chart reviewed. Pt currently out of room for imaging. Will re-attempt at another time.   Kazim Corrales 05/02/2016, 9:46 AM  Greggory Stallion, PT, DPT [phone]

## 2016-05-02 NOTE — Progress Notes (Signed)
SLP Cancellation Note  Patient Details Name: Curtis Powell MRN: [id number] DOB: Sep 28, 1965   Cancelled treatment:       Reason Eval/Treat Not Completed: Patient at procedure or test/unavailable (chart reviewed; consulted NSG). Attempted to see pt x2 today, but he was off the floor at a test then w/ OT for evaluation.  Discussed briefly w/ pt any concerns of swallowing - pt and family and/or caregiver stated pt had difficulty at the lunch meal w/ chewing the meats and clearing the bites. Noted pt was on a regular diet consistency; will modify the diet to a Dysphagia level 3 w/thin liquids. Pt and family and/or caregiver denied any difficulty swallowing the thin liquids, and noted all was consumed on the lunch tray.  ST services will f/u tomorrow w/ assessments. Pt and family/caregiver agreed.   Orinda Kenner, MS, CCC-SLP Curtis Powell 05/02/2016, 2:46 PM

## 2016-05-02 NOTE — Clinical Social Work Note (Signed)
Clinical Social Work Assessment  Patient Details  Name: Curtis Powell MRN: [id number] Date of Birth: [birth year]  Date of referral:  05/02/16               Reason for consult:  Facility Placement                Permission sought to share information with:  Chartered certified accountant granted to share information::  Yes, Verbal Permission Granted  Name::      [place]::   [place]   Relationship::     Contact Information:     Housing/Transportation Living arrangements for the past 2 months:  [place] of Information:  Patient, Parent Patient Interpreter Needed:  None Criminal Activity/Legal Involvement Pertinent to Current Situation/Hospitalization:  No - Comment as needed Significant Relationships:  Parents Lives with:  Self Do you feel safe going back to the place where you live?  Yes Need for family participation in patient care:  Yes (Comment)  Care giving concerns:  Patient lives alone in [place].    Facilities manager / plan:  Holiday representative ([place]) reviewed chart and noted that PT is recommending CIR however there are no beds at [facility] inpatient rehab this week per chart. CSW met with patient and his mother Zigmund Daniel [phone] home, [phone] cell at bedside. Patient was alert and oriented and sitting up in the chair. CSW introduced self and explained role of CSW department. Patient reported that he lives alone in [place] and does not have a HPOA. Per mother she is going to be his HPOA once chaplin's at [facility] assist with completing paper work. CSW explained SNF process and that Mcarthur Rossetti will have to approve SNF. Patient and mother are agreeable to SNF search in [facility] and prefer [facility]. FL2 complete and faxed out. [facility] SNF authorization has been started via [facility]. CSW will continue to follow and assist as needed.   Employment status:  Disabled (Comment on whether or not currently receiving  Disability) Insurance information:  Managed Medicare PT Recommendations:  Inpatient Rehab Consult Information / Referral to community resources:  [place]  Patient/Family's Response to care:  Patient is agreeable to SNF search in [place] and prefers [facility].   Patient/Family's Understanding of and Emotional Response to Diagnosis, Current Treatment, and Prognosis:  Patient and mother were pleasant and thanked CSW for assistance.   Emotional Assessment Appearance:  Appears stated age Attitude/Demeanor/Rapport:    Affect (typically observed):  Accepting, Adaptable, Pleasant Orientation:  Oriented to Self, Oriented to Place, Oriented to  Time, Oriented to Situation Alcohol / Substance use:  Not Applicable Psych involvement (Current and /or in the community):  No (Comment)  Discharge Needs  Concerns to be addressed:  Discharge Planning Concerns Readmission within the last 30 days:  No Current discharge risk:  Dependent with Mobility Barriers to Discharge:  Continued Medical Work up   [employer], Veronia Beets, LCSW 05/02/2016, 1:59 PM

## 2016-05-02 NOTE — Consult Note (Signed)
Referring Physician: Anselm Jungling    Chief Complaint: Right sided weakness, slurred speech  HPI: Curtis Powell is an 51 y.o. male with a history of stroke who reports that he went to sleep on Sunday and was at his baseline.  On Monday when he awakened he noted LE weakness.  This progressed over the week to right sided weakness and speech slurring.  With continued worsening of his symptoms patient presented for evaluation.   Patient at baseline with some left sided weakness from his old infarct.  No longer able to work and ambulated with a Programmer, multimedia.  Initial NIHSS of 1.    Date last known well: Date: 04/28/2016 Time last known well: Unable to determine tPA Given: No: Outside window, unable to determine LKW  Past Medical History:  Diagnosis Date  . GERD (gastroesophageal reflux disease)   . Hyperlipidemia   . Hypertension   . Obesity   . Stroke [facility])     Past Surgical History:  Procedure Laterality Date  . BRAIN SURGERY      Family History  Problem Relation Age of Onset  . Stroke Mother   . Hypertension Mother    Social History:  reports that he has never smoked. He has never used smokeless tobacco. He reports that he does not drink alcohol or use drugs.  Allergies: No Known Allergies  Medications:  I have reviewed the patient's current medications. Prior to Admission:  Prescriptions Prior to Admission  Medication Sig Dispense Refill Last Dose  . aspirin EC 81 MG tablet Take 81 mg by mouth daily.   05/01/2016 at 0800  . hydrochlorothiazide (HYDRODIURIL) 12.5 MG tablet Take 12.5 mg by mouth daily.    05/01/2016 at 0800  . lisinopril (PRINIVIL,ZESTRIL) 5 MG tablet Take 5 mg by mouth daily.   05/01/2016 at 0800  . multivitamin-iron-minerals-folic acid (CENTRUM) chewable tablet Chew 1 tablet by mouth daily.   05/01/2016 at 0800  . omeprazole (PRILOSEC) 20 MG capsule Take 20 mg by mouth daily.   05/01/2016 at 0800  . cyclobenzaprine (FLEXERIL) 10 MG tablet Take 1 tablet (10 mg total)  by mouth every 8 (eight) hours as needed for muscle spasms. (Patient not taking: Reported on 05/01/2016) 15 tablet 0 Completed Course at Unknown time  . traMADol (ULTRAM) 50 MG tablet Take 1 tablet (50 mg total) by mouth every 6 (six) hours as needed for moderate pain. (Patient not taking: Reported on 05/01/2016) 12 tablet 0 Completed Course at Unknown time   Scheduled: . aspirin  300 mg Rectal Daily   Or  . aspirin  325 mg Oral Daily  . atorvastatin  40 mg Oral q1800  . enoxaparin (LOVENOX) injection  40 mg Subcutaneous Q12H  . pantoprazole  40 mg Oral Daily    ROS: History obtained from the patient  General ROS: negative for - chills, fatigue, fever, night sweats, weight gain or weight loss Psychological ROS: memory difficulties Ophthalmic ROS: negative for - blurry vision, double vision, eye pain or loss of vision ENT ROS: difficulty swallowing Allergy and Immunology ROS: negative for - hives or itchy/watery eyes Hematological and Lymphatic ROS: negative for - bleeding problems, bruising or swollen lymph nodes Endocrine ROS: negative for - galactorrhea, hair pattern changes, polydipsia/polyuria or temperature intolerance Respiratory ROS: negative for - cough, hemoptysis, shortness of breath or wheezing Cardiovascular ROS: negative for - chest pain, dyspnea on exertion, edema or irregular heartbeat Gastrointestinal ROS: negative for - abdominal pain, diarrhea, hematemesis, nausea/vomiting or stool incontinence Genito-Urinary ROS: negative  for - dysuria, hematuria, incontinence or urinary frequency/urgency Musculoskeletal ROS: negative for - joint swelling or muscular weakness Neurological ROS: as noted in HPI Dermatological ROS: negative for rash and skin lesion changes  Physical Examination: Blood pressure 127/66, pulse 77, temperature 97.7 F (36.5 C), temperature source Oral, resp. rate 16, height 5' (1.524 m), weight 108.9 kg (240 lb), SpO2 98 %.  HEENT-  Normocephalic, no  lesions, without obvious abnormality.  Normal external eye and conjunctiva.  Normal TM's bilaterally.  Normal auditory canals and external ears. Normal external nose, mucus membranes and septum.  Normal pharynx. Cardiovascular- S1, S2 normal, pulses palpable throughout   Lungs- chest clear, no wheezing, rales, normal symmetric air entry Abdomen- soft, non-tender; bowel sounds normal; no masses,  no organomegaly Extremities- no edema Lymph-no adenopathy palpable Musculoskeletal-no joint tenderness, deformity or swelling Skin-warm and dry, no hyperpigmentation, vitiligo, or suspicious lesions  Neurological Examination Mental Status: Alert, oriented, thought content appropriate.  Speech fluent without evidence of aphasia.  Dysarthria.  Able to follow 3 step commands without difficulty. Cranial Nerves: II: Discs flat bilaterally; Visual fields grossly normal, pupils equal, round, reactive to light and accommodation III,IV, VI: ptosis not present, extra-ocular motions intact bilaterally V,VII: right facial droop, facial light touch sensation normal bilaterally VIII: hearing normal bilaterally IX,X: gag reflex present XI: bilateral shoulder shrug XII: midline tongue extension Motor: Right : Upper extremity   4/5    Left:     Upper extremity   5/5  Lower extremity   4/5     Lower extremity   5/5 Tone and bulk:normal tone throughout; no atrophy noted Sensory: Pinprick and light touch intact throughout, bilaterally Deep Tendon Reflexes: Increased on the left as compared to the right with absent right AJ Plantars: Right: upgoing   Left: mute Cerebellar: Dysmetria with right finger-to-nose testing.  Unable to perform heel-to-shin testing with either leg, right worse than left Gait: not tested due to safety concerns    Laboratory Studies:  Basic Metabolic Panel:  Recent Labs Lab 05/01/16 1215  NA 139  K 4.0  CL 106  CO2 28  GLUCOSE 94  BUN 14  CREATININE 0.99  CALCIUM 9.4     Liver Function Tests: No results for input(s): AST, ALT, ALKPHOS, BILITOT, PROT, ALBUMIN in the last 168 hours. No results for input(s): LIPASE, AMYLASE in the last 168 hours. No results for input(s): AMMONIA in the last 168 hours.  CBC:  Recent Labs Lab 05/01/16 1215  WBC 8.9  HGB 14.3  HCT 43.2  MCV 81.5  PLT 205    Cardiac Enzymes:  Recent Labs Lab 05/01/16 1215  TROPONINI <0.03    BNP: Invalid input(s): POCBNP  CBG:  Recent Labs Lab 05/01/16 1758  GLUCAP 80    Microbiology: No results found for this or any previous visit.  Coagulation Studies: No results for input(s): LABPROT, INR in the last 72 hours.  Urinalysis:  Recent Labs Lab 05/01/16 1345  COLORURINE YELLOW*  LABSPEC 1.021  PHURINE 6.0  GLUCOSEU NEGATIVE  HGBUR NEGATIVE  BILIRUBINUR NEGATIVE  KETONESUR 5*  PROTEINUR NEGATIVE  NITRITE NEGATIVE  LEUKOCYTESUR NEGATIVE    Lipid Panel:    Component Value Date/Time   CHOL 225 (H) 05/02/2016 0436   TRIG 86 05/02/2016 0436   HDL 41 05/02/2016 0436   CHOLHDL 5.5 05/02/2016 0436   VLDL 17 05/02/2016 0436   LDLCALC 167 (H) 05/02/2016 0436    HgbA1C: No results found for: HGBA1C  Urine Drug Screen:  No  results found for: LABOPIA, COCAINSCRNUR, LABBENZ, AMPHETMU, THCU, LABBARB  Alcohol Level: No results for input(s): ETH in the last 168 hours.  Other results: EKG: sinus rhythm at 69 bpm.  Imaging: Ct Head Wo Contrast  Result Date: 05/01/2016 CLINICAL DATA:  Slurred speech, left lower extremity weakness. EXAM: CT HEAD WITHOUT CONTRAST TECHNIQUE: Contiguous axial images were obtained from the base of the skull through the vertex without intravenous contrast. COMPARISON:  CT scan of June 06, 2011. FINDINGS: Brain: Mild diffuse cortical atrophy is noted. Mild chronic ischemic white matter disease is noted. Stable cortical calcifications are noted in the cerebellum and both occipital lobes, as well small amount seen in left frontal  lobe. This may represent chronic sequela from previous remote infection. No mass effect or midline shift is noted. Ventricular size is within normal limits. There is no evidence of mass lesion, hemorrhage or acute infarction. Vascular: Atherosclerosis of carotid siphons is noted. Skull: Normal. Negative for fracture or focal lesion. Sinuses/Orbits: Stable left sphenoid mucous retention cyst. Other: None. IMPRESSION: Mild diffuse cortical atrophy. Mild chronic ischemic white matter disease. No acute intracranial abnormality seen. Stable cortical calcifications seen in the cerebellum and both occipital lobes which may represent chronic sequela from previous infection. Electronically Signed   By: Marijo Conception, M.D.   On: 05/01/2016 15:34   Mr Brain Wo Contrast  Result Date: 05/02/2016 CLINICAL DATA:  Left leg weakness and slurred speech. History of brain surgery in childhood. EXAM: MRI HEAD WITHOUT CONTRAST MRA HEAD WITHOUT CONTRAST TECHNIQUE: Multiplanar, multiecho pulse sequences of the brain and surrounding structures were obtained without intravenous contrast. Angiographic images of the head were obtained using MRA technique without contrast. COMPARISON:  CT head 05/01/2016, 05/29/2011, 10/03/2006 FINDINGS: MRI HEAD FINDINGS Brain: Acute infarct in the left paramedian pons measuring 7 mm. No other acute infarct identified. Moderate atrophy. Moderate cerebellar atrophy bilaterally. Negative for hydrocephalus. Hyperintensity throughout the periventricular and deep white matter bilaterally. There is volume loss most prominent in the frontal lobes. No basal ganglia infarction. Review of the prior CT scans reveals extensive calcification in the brain including the cerebellum bilaterally and in the occipital lobes bilaterally. Small amount of calcification in the right basal ganglia without significant calcification in the left basal ganglia. Suboccipital craniectomy. Negative for Chiari malformation. Possible  small syrinx in the cervical spinal cord. Extra-axial soft tissue mass over the left convexity. This is compatible with meningioma and measures approximately 2 cm in diameter. No edema in the adjacent brain. Vascular: Normal arterial flow voids. Skull and upper cervical spine: No acute skeletal abnormality. Sinuses/Orbits: Mucosal edema left sphenoid sinus. Remaining sinuses clear. Normal orbit. Other: None MRA HEAD FINDINGS Mild irregularity of the distal vertebral artery bilaterally without significant stenosis. Mild irregularity proximal basilar without significant stenosis. AICA patent bilaterally. PICA not visualized. Superior cerebellar arteries are patent with moderate stenosis proximally on the right. Mild irregularity of the posterior cerebral arteries bilaterally without significant stenosis. Focal moderate stenosis right cavernous carotid. Small right A1 segment. Right A2 segment appears occluded proximally. Moderate to severe stenosis right M1. Right middle cerebral artery branches demonstrate mild stenosis at the bifurcation. Left cavernous carotid with mild stenosis . Left anterior cerebral artery patent. Left middle cerebral artery patent with mild stenosis in the left M1 segment and mild to moderate stenosis left MCA bifurcation. Negative for cerebral aneurysm. IMPRESSION: 7 mm acute infarct left paramedian pons Moderate cerebral and cerebellar atrophy as noted on prior studies. There is extensive hyperintensity throughout the cerebral  white matter bilaterally with volume loss in the frontal lobes. Extensive calcification is present in the brain most notably in the cerebellum and occipital lobes bilaterally. Relative sparing of the basal ganglia. Findings are suggestive of chronic insult with secondary calcification. This is most likely due to chronic infection in the brain. Calcium metabolism abnormalities such as hypoparathyroidism or hyperparathyroidism or possible. Fahr's syndrome not considered  as likely given the relative sparing of the basal ganglia. 2 cm left convexity meningioma without brain edema. Intracranial atherosclerotic disease as above. There is moderately severe atherosclerotic disease with multiple areas of stenosis most notably in the right M1 segment. There is occlusion of the right A2 segment. Electronically Signed   By: Franchot Gallo M.D.   On: 05/02/2016 10:31   US Carotid Bilateral (at [facility] And [facility] Only)  Result Date: 05/02/2016 CLINICAL DATA:  Left-sided weakness. EXAM: BILATERAL CAROTID DUPLEX ULTRASOUND TECHNIQUE: Pearline Cables scale imaging, color Doppler and duplex ultrasound were performed of bilateral carotid and vertebral arteries in the neck. COMPARISON:  CT 05/01/2016 . FINDINGS: Criteria: Quantification of carotid stenosis is based on velocity parameters that correlate the residual internal carotid diameter with NASCET-based stenosis levels, using the diameter of the distal internal carotid lumen as the denominator for stenosis measurement. The following velocity measurements were obtained: RIGHT ICA:  64/24 Cm/sec CCA:  50/38 cm/sec SYSTOLIC ICA/CCA RATIO:  0.9 DIASTOLIC ICA/CCA RATIO:  1.4 ECA:  60 cm/sec LEFT ICA:  60/23 cm/sec CCA:  882/80 cm/sec SYSTOLIC ICA/CCA RATIO:  0.6 DIASTOLIC ICA/CCA RATIO:  1.5 ECA:  58 cm/sec RIGHT CAROTID ARTERY: Mild punctate plaque right carotid bifurcation. No flow limiting stenosis. RIGHT VERTEBRAL ARTERY:  Patent with antegrade flow. LEFT CAROTID ARTERY: Mild punctate plaque left carotid bifurcation. No flow limiting stenosis. LEFT VERTEBRAL ARTERY:  Patent with antegrade flow. Multiple solid thyroid nodules are noted throughout both thyroid lobes largest on right measures 4.4 cm maximum diameter, and on the left 2.7 cm in maximum diameter. Dedicated thyroid ultrasound suggested for further evaluation. IMPRESSION: 1. Mild bilateral punctate carotid bifurcation atherosclerotic vascular plaque. No flow limiting stenosis. Degree of stenosis less  than 50% bilaterally. 2. Vertebral artery patent antegrade flow. 3. Multiple large solid thyroid nodules noted throughout both thyroid lobes. Dedicated thyroid ultrasound suggested for further evaluation. Electronically Signed   By: Marcello Moores  Register   On: 05/02/2016 08:49   Mr Jodene Nam Head/brain Wo Cm  Result Date: 05/02/2016 CLINICAL DATA:  Left leg weakness and slurred speech. History of brain surgery in childhood. EXAM: MRI HEAD WITHOUT CONTRAST MRA HEAD WITHOUT CONTRAST TECHNIQUE: Multiplanar, multiecho pulse sequences of the brain and surrounding structures were obtained without intravenous contrast. Angiographic images of the head were obtained using MRA technique without contrast. COMPARISON:  CT head 05/01/2016, 05/29/2011, 10/03/2006 FINDINGS: MRI HEAD FINDINGS Brain: Acute infarct in the left paramedian pons measuring 7 mm. No other acute infarct identified. Moderate atrophy. Moderate cerebellar atrophy bilaterally. Negative for hydrocephalus. Hyperintensity throughout the periventricular and deep white matter bilaterally. There is volume loss most prominent in the frontal lobes. No basal ganglia infarction. Review of the prior CT scans reveals extensive calcification in the brain including the cerebellum bilaterally and in the occipital lobes bilaterally. Small amount of calcification in the right basal ganglia without significant calcification in the left basal ganglia. Suboccipital craniectomy. Negative for Chiari malformation. Possible small syrinx in the cervical spinal cord. Extra-axial soft tissue mass over the left convexity. This is compatible with meningioma and measures approximately 2 cm in diameter. No edema  in the adjacent brain. Vascular: Normal arterial flow voids. Skull and upper cervical spine: No acute skeletal abnormality. Sinuses/Orbits: Mucosal edema left sphenoid sinus. Remaining sinuses clear. Normal orbit. Other: None MRA HEAD FINDINGS Mild irregularity of the distal vertebral  artery bilaterally without significant stenosis. Mild irregularity proximal basilar without significant stenosis. AICA patent bilaterally. PICA not visualized. Superior cerebellar arteries are patent with moderate stenosis proximally on the right. Mild irregularity of the posterior cerebral arteries bilaterally without significant stenosis. Focal moderate stenosis right cavernous carotid. Small right A1 segment. Right A2 segment appears occluded proximally. Moderate to severe stenosis right M1. Right middle cerebral artery branches demonstrate mild stenosis at the bifurcation. Left cavernous carotid with mild stenosis . Left anterior cerebral artery patent. Left middle cerebral artery patent with mild stenosis in the left M1 segment and mild to moderate stenosis left MCA bifurcation. Negative for cerebral aneurysm. IMPRESSION: 7 mm acute infarct left paramedian pons Moderate cerebral and cerebellar atrophy as noted on prior studies. There is extensive hyperintensity throughout the cerebral white matter bilaterally with volume loss in the frontal lobes. Extensive calcification is present in the brain most notably in the cerebellum and occipital lobes bilaterally. Relative sparing of the basal ganglia. Findings are suggestive of chronic insult with secondary calcification. This is most likely due to chronic infection in the brain. Calcium metabolism abnormalities such as hypoparathyroidism or hyperparathyroidism or possible. Fahr's syndrome not considered as likely given the relative sparing of the basal ganglia. 2 cm left convexity meningioma without brain edema. Intracranial atherosclerotic disease as above. There is moderately severe atherosclerotic disease with multiple areas of stenosis most notably in the right M1 segment. There is occlusion of the right A2 segment. Electronically Signed   By: Franchot Gallo M.D.   On: 05/02/2016 10:31    Assessment: 51 y.o. male with a history of infarct related to past  radiation treatment who presents with new onset right sided weakness and dysarthria.  MRI of the brain reviewed and shows an acute left paramedian pontine infarct.  Likely due to small vessel disease.  Patient on ASA at home.  Unclear what work up was performed in the past.  Hypercoagulable work up recommended.  Carotid dopplers show no evidence of hemodynamically significant stenosis.  Echocardiogram shows no cardiac source of emboli with an EF of 65-70%.  A1c pending, LDL 167.  Stroke Risk Factors - hyperlipidemia and hypertension  Plan: 1. HgbA1c, fasting lipid panel, ESR, Protein S, Protein C, ATIII, lupus anticoagulant, PT/PTT, INR, factor V, homocysteine 2. Statin for lipid management with target LDL<70. 3. PT consult, OT consult, Speech consult 4. Prophylactic therapy-Antiplatelet med: Plavix - dose 26m daily.  Patient with a history of GIB on ASA therefore will not place on dual platelet therapy.   5. NPO until RN stroke swallow screen 6. Telemetry monitoring 7. Frequent neuro checks   LAlexis Goodell MD Neurology [phone]/25/2018, 2:16 PM

## 2016-05-02 NOTE — Care Management Obs Status (Signed)
[place] NOTIFICATION   Patient Details  Name: CADAN GRAVETT MRN: [id number] Date of Birth: 17-Feb-1966   Medicare Observation Status Notification Given:  Yes    Shelbie Ammons, RN 05/02/2016, 8:43 AM

## 2016-05-02 NOTE — Progress Notes (Signed)
Pt reported R side pain 10/10 @ 0500 neuro check.  Too early for PRN Tylenol, Dr. Marcille Blanco paged.  Received ordered PRN Percocet 5mg  x1.

## 2016-05-02 NOTE — Progress Notes (Signed)
OT Cancellation Note  Patient Details Name: Curtis Powell MRN: [id number] DOB: May 03, 1965   Cancelled Treatment:    Reason Eval/Treat Not Completed: Patient at procedure or test/ unavailable. Pt out of room for testing at 9:53am. Will attempt at later date/time as pt is available.  Corky Sox, OTR/L 05/02/2016, 10:32 AM

## 2016-05-02 NOTE — NC FL2 (Signed)
[place] LEVEL OF CARE SCREENING TOOL     IDENTIFICATION  Patient Name: Curtis Powell Birthdate: [birth year] Sex: male Admission Date (Current Location): 05/01/2016  [facility] and [place] Number:  Selena Lesser  ([id number]) Facility and Address:  [facility], [address] [place], [place] [address]      Provider Number: [id number]  Attending Physician Name and Address:  Vaughan Basta, MD  Relative Name and Phone Number:       Current Level of Care: Hospital Recommended Level of Care: [place] Prior Approval Number:    Date Approved/Denied:   PASRR Number:  ([id number])  Discharge Plan: SNF    Current Diagnoses: Patient Active Problem List   Diagnosis Date Noted  . Acute CVA (cerebrovascular accident) ([place]) 05/02/2016  . Left-sided weakness 05/01/2016    Orientation RESPIRATION BLADDER Height & Weight     Self, Time, Situation, Place  Normal Continent Weight: 240 lb (108.9 kg) Height:  5' (152.4 cm)  BEHAVIORAL SYMPTOMS/MOOD NEUROLOGICAL BOWEL NUTRITION STATUS   (None. )  (None. ) Continent Diet (Diet: Heart Room )  AMBULATORY STATUS COMMUNICATION OF NEEDS Skin   Extensive Assist Verbally Normal                       Personal Care Assistance Level of Assistance  Bathing, Feeding, Dressing Bathing Assistance: Limited assistance Feeding assistance: Independent Dressing Assistance: Limited assistance     Functional Limitations Info  Sight, Hearing, Speech Sight Info: Adequate Hearing Info: Adequate Speech Info: Adequate    SPECIAL CARE FACTORS FREQUENCY  PT (By licensed PT), OT (By licensed OT)     PT Frequency:  (5) OT Frequency:  (5)            Contractures      Additional Factors Info  Code Status, Allergies Code Status Info:  (Full Code) Allergies Info:  (No Known Allergies)           Current Medications (05/02/2016):  This is the current hospital active medication  list Current Facility-Administered Medications  Medication Dose Route Frequency Provider Last Rate Last Dose  . acetaminophen (TYLENOL) tablet 650 mg  650 mg Oral Q4H PRN Demetrios Loll, MD   650 mg at 05/02/16 [id number]   Or  . acetaminophen (TYLENOL) solution 650 mg  650 mg Per Tube Q4H PRN Demetrios Loll, MD       Or  . acetaminophen (TYLENOL) suppository 650 mg  650 mg Rectal Q4H PRN Demetrios Loll, MD      . aspirin suppository 300 mg  300 mg Rectal Daily Demetrios Loll, MD       Or  . aspirin tablet 325 mg  325 mg Oral Daily Demetrios Loll, MD   325 mg at 05/02/16 1228  . atorvastatin (LIPITOR) tablet 40 mg  40 mg Oral q1800 Demetrios Loll, MD   40 mg at 05/01/16 2050  . enoxaparin (LOVENOX) injection 40 mg  40 mg Subcutaneous Q12H Sheema M Hallaji, RPH   40 mg at 05/02/16 1228  . oxyCODONE-acetaminophen (PERCOCET/ROXICET) 5-325 MG per tablet 1 tablet  1 tablet Oral Q6H PRN Harrie Foreman, MD   1 tablet at 05/02/16 0525  . pantoprazole (PROTONIX) EC tablet 40 mg  40 mg Oral Daily Demetrios Loll, MD   40 mg at 05/02/16 1228  . senna-docusate (Senokot-S) tablet 1 tablet  1 tablet Oral QHS PRN Demetrios Loll, MD         Discharge Medications: Please  see discharge summary for a list of discharge medications.  Relevant Imaging Results:  Relevant Lab Results:   Additional Information  (SSN: [id number])  Danie Chandler, Student-Social Work

## 2016-05-02 NOTE — Progress Notes (Signed)
PHARMACIST - PHYSICIAN COMMUNICATION  CONCERNING:  Enoxaparin (Lovenox) for DVT Prophylaxis   RECOMMENDATION: Patient was prescribed enoxaprin 40mg  q24 hours for VTE prophylaxis.   Filed Weights   05/01/16 1138  Weight: 240 lb (108.9 kg)   Body mass index is 46.87 kg/m.  Estimated Creatinine Clearance: 94 mL/min (by C-G formula based on SCr of 0.99 mg/dL).  Based on [place] patient is candidate for enoxaparin 40mg  every 12 hour dosing due to BMI being >40.  DESCRIPTION: Pharmacy has adjusted enoxaparin dose per [facility] policy.  Patient is now receiving enoxaparin 40mg  every 12 hours.   Nancy Fetter, PharmD Clinical Pharmacist  05/02/2016 8:44 AM

## 2016-05-02 NOTE — Clinical Social Work Placement (Deleted)
   CLINICAL SOCIAL WORK PLACEMENT  NOTE  Date:  05/02/2016  Patient Details  Name: Curtis Powell MRN: [id number] Date of Birth: 1965/09/21  Clinical Social Work is seeking post-discharge placement for this patient at the [place] level of care (*CSW will initial, date and re-position this form in  chart as items are completed):  Yes   Patient/family provided with [place] Work Department's list of facilities offering this level of care within the geographic area requested by the patient (or if unable, by the patient's family).  Yes   Patient/family informed of their freedom to choose among providers that offer the needed level of care, that participate in Medicare, Medicaid or managed care program needed by the patient, have an available bed and are willing to accept the patient.      Patient/family informed of [facility]'s ownership interest in [facility] and [facility], as well as of the fact that they are under no obligation to receive care at these facilities.  PASRR submitted to EDS on 05/02/16     PASRR number received on 05/02/16     Existing PASRR number confirmed on       FL2 transmitted to all facilities in geographic area requested by pt/family on       FL2 transmitted to all facilities within larger geographic area on 05/02/16     Patient informed that his/her managed care company has contracts with or will negotiate with certain facilities, including the following:            Patient/family informed of bed offers received.  Patient chooses bed at       Physician recommends and patient chooses bed at      Patient to be transferred to   on  .  Patient to be transferred to facility by       Patient family notified on   of transfer.  Name of family member notified:        PHYSICIAN       Additional Comment:    _______________________________________________ Danie Chandler, [place] Work 05/02/2016,  1:49 PM

## 2016-05-02 NOTE — Plan of Care (Signed)
Problem: Education: Goal: Knowledge of [place] [facility] Education information/materials will improve Outcome: Progressing VSS, free of falls during shift.  Reported R leg pain 8/10, received PRN Tylenol 650mg .  No other complaints overnight.  Bed in low position, call bell within reach.  WCTM.

## 2016-05-02 NOTE — Progress Notes (Signed)
[facility] [place] NAME: Curtis Powell    MR#:  [id number]  DATE OF BIRTH:  11-Jan-1966  SUBJECTIVE:  CHIEF COMPLAINT:   Chief Complaint  Patient presents with  . Back Pain  . Weakness    Hx of stroke- was on ASA- came with weakness. Found to have a new stroke.  Also c/o some difficulty in swallowing food.  REVIEW OF SYSTEMS:  CONSTITUTIONAL: No fever, fatigue or weakness.  EYES: No blurred or double vision.  EARS, NOSE, AND THROAT: No tinnitus or ear pain.  RESPIRATORY: No cough, shortness of breath, wheezing or hemoptysis.  CARDIOVASCULAR: No chest pain, orthopnea, edema.  GASTROINTESTINAL: No nausea, vomiting, diarrhea or abdominal pain.  GENITOURINARY: No dysuria, hematuria.  ENDOCRINE: No polyuria, nocturia,  HEMATOLOGY: No anemia, easy bruising or bleeding SKIN: No rash or lesion. MUSCULOSKELETAL: No joint pain or arthritis.   NEUROLOGIC: No tingling, right side numbness, weakness.  PSYCHIATRY: No anxiety or depression.   ROS  DRUG ALLERGIES:  No Known Allergies  VITALS:  Blood pressure 127/66, pulse 77, temperature 97.7 F (36.5 C), temperature source Oral, resp. rate 16, height 5' (1.524 m), weight 108.9 kg (240 lb), SpO2 98 %.  PHYSICAL EXAMINATION:  GENERAL:  51 y.o.-year-old patient lying in the bed with no acute distress.  EYES: Pupils equal, round, reactive to light and accommodation. No scleral icterus. Extraocular muscles intact.  HEENT: Head atraumatic, normocephalic. Oropharynx and nasopharynx clear.  NECK:  Supple, no jugular venous distention. No thyroid enlargement, no tenderness.  LUNGS: Normal breath sounds bilaterally, no wheezing, rales,rhonchi or crepitation. No use of accessory muscles of respiration.  CARDIOVASCULAR: S1, S2 normal. No murmurs, rubs, or gallops.  ABDOMEN: Soft, nontender, nondistended. Bowel sounds present. No organomegaly or mass.  EXTREMITIES: No pedal edema, cyanosis, or clubbing.   NEUROLOGIC: face have weakness on right side. Muscle strength 5/5 in left extremities and 3-4 /5 in right side both extremities. Sensation intact. Gait not checked.  PSYCHIATRIC: The patient is alert and oriented x 3.  SKIN: No obvious rash, lesion, or ulcer.   Physical Exam LABORATORY PANEL:   CBC  Recent Labs Lab 05/01/16 1215  WBC 8.9  HGB 14.3  HCT 43.2  PLT 205   ------------------------------------------------------------------------------------------------------------------  Chemistries   Recent Labs Lab 05/01/16 1215  NA 139  K 4.0  CL 106  CO2 28  GLUCOSE 94  BUN 14  CREATININE 0.99  CALCIUM 9.4   ------------------------------------------------------------------------------------------------------------------  Cardiac Enzymes  Recent Labs Lab 05/01/16 1215  TROPONINI <0.03   ------------------------------------------------------------------------------------------------------------------  RADIOLOGY:  Ct Head Wo Contrast  Result Date: 05/01/2016 CLINICAL DATA:  Slurred speech, left lower extremity weakness. EXAM: CT HEAD WITHOUT CONTRAST TECHNIQUE: Contiguous axial images were obtained from the base of the skull through the vertex without intravenous contrast. COMPARISON:  CT scan of June 06, 2011. FINDINGS: Brain: Mild diffuse cortical atrophy is noted. Mild chronic ischemic white matter disease is noted. Stable cortical calcifications are noted in the cerebellum and both occipital lobes, as well small amount seen in left frontal lobe. This may represent chronic sequela from previous remote infection. No mass effect or midline shift is noted. Ventricular size is within normal limits. There is no evidence of mass lesion, hemorrhage or acute infarction. Vascular: Atherosclerosis of carotid siphons is noted. Skull: Normal. Negative for fracture or focal lesion. Sinuses/Orbits: Stable left sphenoid mucous retention cyst. Other: None. IMPRESSION: Mild diffuse  cortical atrophy. Mild chronic ischemic white matter disease. No  acute intracranial abnormality seen. Stable cortical calcifications seen in the cerebellum and both occipital lobes which may represent chronic sequela from previous infection. Electronically Signed   By: Marijo Conception, M.D.   On: 05/01/2016 15:34   Mr Brain Wo Contrast  Result Date: 05/02/2016 CLINICAL DATA:  Left leg weakness and slurred speech. History of brain surgery in childhood. EXAM: MRI HEAD WITHOUT CONTRAST MRA HEAD WITHOUT CONTRAST TECHNIQUE: Multiplanar, multiecho pulse sequences of the brain and surrounding structures were obtained without intravenous contrast. Angiographic images of the head were obtained using MRA technique without contrast. COMPARISON:  CT head 05/01/2016, 05/29/2011, 10/03/2006 FINDINGS: MRI HEAD FINDINGS Brain: Acute infarct in the left paramedian pons measuring 7 mm. No other acute infarct identified. Moderate atrophy. Moderate cerebellar atrophy bilaterally. Negative for hydrocephalus. Hyperintensity throughout the periventricular and deep white matter bilaterally. There is volume loss most prominent in the frontal lobes. No basal ganglia infarction. Review of the prior CT scans reveals extensive calcification in the brain including the cerebellum bilaterally and in the occipital lobes bilaterally. Small amount of calcification in the right basal ganglia without significant calcification in the left basal ganglia. Suboccipital craniectomy. Negative for Chiari malformation. Possible small syrinx in the cervical spinal cord. Extra-axial soft tissue mass over the left convexity. This is compatible with meningioma and measures approximately 2 cm in diameter. No edema in the adjacent brain. Vascular: Normal arterial flow voids. Skull and upper cervical spine: No acute skeletal abnormality. Sinuses/Orbits: Mucosal edema left sphenoid sinus. Remaining sinuses clear. Normal orbit. Other: None MRA HEAD FINDINGS Mild  irregularity of the distal vertebral artery bilaterally without significant stenosis. Mild irregularity proximal basilar without significant stenosis. AICA patent bilaterally. PICA not visualized. Superior cerebellar arteries are patent with moderate stenosis proximally on the right. Mild irregularity of the posterior cerebral arteries bilaterally without significant stenosis. Focal moderate stenosis right cavernous carotid. Small right A1 segment. Right A2 segment appears occluded proximally. Moderate to severe stenosis right M1. Right middle cerebral artery branches demonstrate mild stenosis at the bifurcation. Left cavernous carotid with mild stenosis . Left anterior cerebral artery patent. Left middle cerebral artery patent with mild stenosis in the left M1 segment and mild to moderate stenosis left MCA bifurcation. Negative for cerebral aneurysm. IMPRESSION: 7 mm acute infarct left paramedian pons Moderate cerebral and cerebellar atrophy as noted on prior studies. There is extensive hyperintensity throughout the cerebral white matter bilaterally with volume loss in the frontal lobes. Extensive calcification is present in the brain most notably in the cerebellum and occipital lobes bilaterally. Relative sparing of the basal ganglia. Findings are suggestive of chronic insult with secondary calcification. This is most likely due to chronic infection in the brain. Calcium metabolism abnormalities such as hypoparathyroidism or hyperparathyroidism or possible. Fahr's syndrome not considered as likely given the relative sparing of the basal ganglia. 2 cm left convexity meningioma without brain edema. Intracranial atherosclerotic disease as above. There is moderately severe atherosclerotic disease with multiple areas of stenosis most notably in the right M1 segment. There is occlusion of the right A2 segment. Electronically Signed   By: Franchot Gallo M.D.   On: 05/02/2016 10:31   US Carotid Bilateral (at [facility]  Only)  Result Date: 05/02/2016 CLINICAL DATA:  Left-sided weakness. EXAM: BILATERAL CAROTID DUPLEX ULTRASOUND TECHNIQUE: Pearline Cables scale imaging, color Doppler and duplex ultrasound were performed of bilateral carotid and vertebral arteries in the neck. COMPARISON:  CT 05/01/2016 . FINDINGS: Criteria: Quantification of carotid stenosis is based on velocity  parameters that correlate the residual internal carotid diameter with NASCET-based stenosis levels, using the diameter of the distal internal carotid lumen as the denominator for stenosis measurement. The following velocity measurements were obtained: RIGHT ICA:  64/24 Cm/sec CCA:  [id number] cm/sec SYSTOLIC ICA/CCA RATIO:  0.9 DIASTOLIC ICA/CCA RATIO:  1.4 ECA:  60 cm/sec LEFT ICA:  60/23 cm/sec CCA:  [id number] cm/sec SYSTOLIC ICA/CCA RATIO:  0.6 DIASTOLIC ICA/CCA RATIO:  1.5 ECA:  58 cm/sec RIGHT CAROTID ARTERY: Mild punctate plaque right carotid bifurcation. No flow limiting stenosis. RIGHT VERTEBRAL ARTERY:  Patent with antegrade flow. LEFT CAROTID ARTERY: Mild punctate plaque left carotid bifurcation. No flow limiting stenosis. LEFT VERTEBRAL ARTERY:  Patent with antegrade flow. Multiple solid thyroid nodules are noted throughout both thyroid lobes largest on right measures 4.4 cm maximum diameter, and on the left 2.7 cm in maximum diameter. Dedicated thyroid ultrasound suggested for further evaluation. IMPRESSION: 1. Mild bilateral punctate carotid bifurcation atherosclerotic vascular plaque. No flow limiting stenosis. Degree of stenosis less than 50% bilaterally. 2. Vertebral artery patent antegrade flow. 3. Multiple large solid thyroid nodules noted throughout both thyroid lobes. Dedicated thyroid ultrasound suggested for further evaluation. Electronically Signed   By: Marcello Moores  Register   On: 05/02/2016 08:49   Mr Jodene Nam Head/brain Wo Cm  Result Date: 05/02/2016 CLINICAL DATA:  Left leg weakness and slurred speech. History of brain surgery in childhood. EXAM: MRI  HEAD WITHOUT CONTRAST MRA HEAD WITHOUT CONTRAST TECHNIQUE: Multiplanar, multiecho pulse sequences of the brain and surrounding structures were obtained without intravenous contrast. Angiographic images of the head were obtained using MRA technique without contrast. COMPARISON:  CT head 05/01/2016, 05/29/2011, 10/03/2006 FINDINGS: MRI HEAD FINDINGS Brain: Acute infarct in the left paramedian pons measuring 7 mm. No other acute infarct identified. Moderate atrophy. Moderate cerebellar atrophy bilaterally. Negative for hydrocephalus. Hyperintensity throughout the periventricular and deep white matter bilaterally. There is volume loss most prominent in the frontal lobes. No basal ganglia infarction. Review of the prior CT scans reveals extensive calcification in the brain including the cerebellum bilaterally and in the occipital lobes bilaterally. Small amount of calcification in the right basal ganglia without significant calcification in the left basal ganglia. Suboccipital craniectomy. Negative for Chiari malformation. Possible small syrinx in the cervical spinal cord. Extra-axial soft tissue mass over the left convexity. This is compatible with meningioma and measures approximately 2 cm in diameter. No edema in the adjacent brain. Vascular: Normal arterial flow voids. Skull and upper cervical spine: No acute skeletal abnormality. Sinuses/Orbits: Mucosal edema left sphenoid sinus. Remaining sinuses clear. Normal orbit. Other: None MRA HEAD FINDINGS Mild irregularity of the distal vertebral artery bilaterally without significant stenosis. Mild irregularity proximal basilar without significant stenosis. AICA patent bilaterally. PICA not visualized. Superior cerebellar arteries are patent with moderate stenosis proximally on the right. Mild irregularity of the posterior cerebral arteries bilaterally without significant stenosis. Focal moderate stenosis right cavernous carotid. Small right A1 segment. Right A2 segment  appears occluded proximally. Moderate to severe stenosis right M1. Right middle cerebral artery branches demonstrate mild stenosis at the bifurcation. Left cavernous carotid with mild stenosis . Left anterior cerebral artery patent. Left middle cerebral artery patent with mild stenosis in the left M1 segment and mild to moderate stenosis left MCA bifurcation. Negative for cerebral aneurysm. IMPRESSION: 7 mm acute infarct left paramedian pons Moderate cerebral and cerebellar atrophy as noted on prior studies. There is extensive hyperintensity throughout the cerebral white matter bilaterally with volume loss in the frontal lobes. Extensive calcification  is present in the brain most notably in the cerebellum and occipital lobes bilaterally. Relative sparing of the basal ganglia. Findings are suggestive of chronic insult with secondary calcification. This is most likely due to chronic infection in the brain. Calcium metabolism abnormalities such as hypoparathyroidism or hyperparathyroidism or possible. Fahr's syndrome not considered as likely given the relative sparing of the basal ganglia. 2 cm left convexity meningioma without brain edema. Intracranial atherosclerotic disease as above. There is moderately severe atherosclerotic disease with multiple areas of stenosis most notably in the right M1 segment. There is occlusion of the right A2 segment. Electronically Signed   By: Franchot Gallo M.D.   On: 05/02/2016 10:31    ASSESSMENT AND PLAN:   Active Problems:   Left-sided weakness   Acute CVA (cerebrovascular accident) ([place])  * Acute stroke    This is confirmed on MRI brain.   He had a stroke 8 years ago.   Had GI bleed in past with aSpirin.   Appreciated neurology help.   Ordered hypercoagulable work ups.   Echo and carotid doppler.   Statin.  * Hypertension. Hold HCTZ and lisinopril due to low side blood pressure. * Hyperlipidemia. Continue Lipitor. * Obesity. * GERD. Protonix.    All the  records are reviewed and case discussed with Care Management/Social Workerr. Management plans discussed with the patient, family and they are in agreement.  CODE STATUS: Full  TOTAL TIME TAKING CARE OF THIS PATIENT: 35 minutes.  Spoke to pt' mother. Also with a neurologist.   POSSIBLE D/C IN 1-2 DAYS, DEPENDING ON CLINICAL CONDITION.   Vaughan Basta M.D on 05/02/2016   Between 7am to 6pm - Pager - [phone]  After 6pm go to [email or web address] - password [email or web address] [facility]  Office  [phone]  CC: Primary care physician; No PCP Per Patient  Note: This dictation was prepared with Dragon dictation along with smaller phrase technology. Any transcriptional errors that result from this process are unintentional.

## 2016-05-02 NOTE — Evaluation (Signed)
Physical Therapy Evaluation Patient Details Name: Curtis Powell MRN: [id number] DOB: [birth year] Today's Date: 05/02/2016   History of Present Illness  Pt admitted for complaints of L side weakness. 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  Clinical Impression  Pt is a pleasant 51 year old male who was admitted for positive CVA. Pt performs bed mobility with min assist, transfers with min assist, and ambulation with mod assist +2 and RW. Pt has increased difficulty with stepping with R foot, needs physical assist for stepping. Slurred speech and decreased coordination in UE/LE present on R side.  Pt demonstrates deficits with balance/strength/mobility. Pt very motivated to perform therapy, needs OT and speech consult as well. Would benefit from skilled PT to address above deficits and promote optimal return to PLOF. Secondary to increased rehab needs, would benefit from admission to CIR for intense therapy to maximize potential return to baseline functioning.       Follow Up Recommendations CIR    Equipment Recommendations       Recommendations for Other Services OT consult;Speech consult     Precautions / Restrictions Precautions Precautions: Fall Restrictions Weight Bearing Restrictions: No      Mobility  Bed Mobility Overal bed mobility: Needs Assistance Bed Mobility: Supine to Sit     Supine to sit: Min assist     General bed mobility comments: assist for initiation of movement. Needs assist with upper body strength and trunk support. Once seated at EOB, needs min assist to scoot towards EOB while maintaining balance.  SOB symptoms noted with exertion, O2 sats WNL on room air  Transfers Overall transfer level: Needs assistance Equipment used: Rolling walker (2 wheeled) Transfers: Sit to/from Stand Sit to Stand: Min assist          General transfer comment: 2 attempts for transfer. Once standing, needs cues to keep RW close to body. Increased R side weakness noted with difficulty with hand on RW and pushing through arm  Ambulation/Gait Ambulation/Gait assistance: Min assist;Mod assist;+2 safety/equipment;+2 physical assistance Ambulation Distance (Feet): 3 Feet Assistive device: Rolling walker (2 wheeled) Gait Pattern/deviations: Step-to pattern     General Gait Details: Attempted stepping intially with difficulty clearing R foot off ground. Decreased weight shift and ability to push on B UE to unweight R LE. Pt returned to seated position. 2nd attempt of ambulation with +2 assist and mod assist to ambulate from bed->chair. Increased SOB with exertion and physical assist for stepping with R foot. Pt able to follow commands for all mobility. Requires use of RW at this time  Stairs            Wheelchair Mobility    Modified Rankin (Stroke Patients Only)       Balance Overall balance assessment: History of Falls;Needs assistance (1 fall a few days ago per patient report) Sitting-balance support: Feet supported Sitting balance-Leahy Scale: Fair     Standing balance support: Bilateral upper extremity supported Standing balance-Leahy Scale: Fair                               Pertinent Vitals/Pain Pain Assessment: No/denies pain    Home Living Family/patient expects to be discharged to:: Private residence Living Arrangements: Alone Available Help at Discharge: Personal care attendant (comes a few hours a day  to assist with meals) Type of Home: House Home Access: Stairs to enter Entrance Stairs-Rails: None Entrance Stairs-Number of Steps: 1 Home Layout: One level Home Equipment: Center Sandwich - 2 wheels;Cane - single point      Prior Function Level of Independence: Independent with assistive device(s)         Comments: using SPC for all household mobility     Hand Dominance         Extremity/Trunk Assessment   Upper Extremity Assessment Upper Extremity Assessment: Generalized weakness (R UE grossly 4-/5 with decreased coordination noted)    Lower Extremity Assessment Lower Extremity Assessment: RLE deficits/detail RLE Deficits / Details: R LE dorsiflexion 3/5; knee flexion 4-/5; hip flexion 4/5 RLE Sensation:  (WFL) RLE Coordination: decreased fine motor       Communication   Communication: Expressive difficulties  Cognition Arousal/Alertness: Awake/alert Behavior During Therapy: WFL for tasks assessed/performed Overall Cognitive Status: Within Functional Limits for tasks assessed                      General Comments      Exercises Other Exercises Other Exercises: Seated ther-ex performed on R LE including SAQ, hip abd/add, and SLRs. All ther-ex performed x 10 reps with cga/min assist for completion. Pt educated on frequency and duration to improve strength.   Assessment/Plan    PT Assessment Patient needs continued PT services  PT Problem List Decreased strength;Decreased activity tolerance;Decreased balance;Decreased mobility;Decreased coordination;Decreased safety awareness;Obesity          PT Treatment Interventions DME instruction;Gait training;Therapeutic activities;Therapeutic exercise;Balance training;Neuromuscular re-education    PT Goals (Current goals can be found in the Care Plan section)  Acute Rehab PT Goals Patient Stated Goal: to get stronger PT Goal Formulation: With patient Time For Goal Achievement: 05/16/16 Potential to Achieve Goals: Good    Frequency 7X/week   Barriers to discharge Decreased caregiver support      Co-evaluation               End of Session Equipment Utilized During Treatment: Gait belt Activity Tolerance: Patient tolerated treatment well Patient left: in chair;with chair alarm set;with family/visitor present Nurse Communication: Mobility status         Time: [id number] PT Time  Calculation (min) (ACUTE ONLY): 35 min   Charges:   PT Evaluation $PT Eval Moderate Complexity: 1 Procedure PT Treatments $Therapeutic Exercise: 8-22 mins   PT G Codes:        Deepak Bless 05/09/16, 11:59 AM  Greggory Stallion, PT, DPT [phone]

## 2016-05-02 NOTE — Care Management (Signed)
Admitted to this facility with diagnosis of left sided weakness (CVA). Lives alone. Mother is Dedra Skeens [phone]). Last seen Dr. Kristin Bruins in [place] 02/18/17. Personal care services per [facility] 8:am-11:am Monday-Friday. Saturday and Sunday Self feeds, needs help with baths and dressing. No skilled facility. No home oxygen. Bedside commode and rolling walker in the home. Fell last week. Alright appetite. Doesn't drive. Mother helps with errand.  Physical therapy evaluation completed. Recommending Inpatient Acute Rehabilitation. No beds available in [place].  Discussed with Mr. Fazzio and mother at the bedside. Would like local bed search. Will update Mel Almond, [facility] representative.            Shelbie Ammons RN MSN CCM Care Management

## 2016-05-02 NOTE — Clinical Social Work Placement (Signed)
   CLINICAL SOCIAL WORK PLACEMENT  NOTE  Date:  05/02/2016  Patient Details  Name: Curtis Powell MRN: [id number] Date of Birth: 10-03-1965  Clinical Social Work is seeking post-discharge placement for this patient at the [place] level of care (*CSW will initial, date and re-position this form in  chart as items are completed):  Yes   Patient/family provided with [facility] Work Department's list of facilities offering this level of care within the geographic area requested by the patient (or if unable, by the patient's family).  Yes   Patient/family informed of their freedom to choose among providers that offer the needed level of care, that participate in Medicare, Medicaid or managed care program needed by the patient, have an available bed and are willing to accept the patient.  Yes   Patient/family informed of [facility]'s ownership interest in [facility] and [facility], as well as of the fact that they are under no obligation to receive care at these facilities.  PASRR submitted to EDS on 05/02/16     PASRR number received on 05/02/16     Existing PASRR number confirmed on       FL2 transmitted to all facilities in geographic area requested by pt/family on       FL2 transmitted to all facilities within larger geographic area on 05/02/16     Patient informed that his/her managed care company has contracts with or will negotiate with certain facilities, including the following:            Patient/family informed of bed offers received.  Patient chooses bed at       Physician recommends and patient chooses bed at      Patient to be transferred to   on  .  Patient to be transferred to facility by       Patient family notified on   of transfer.  Name of family member notified:        PHYSICIAN       Additional Comment:    _______________________________________________ Danie Chandler, [facility] [place] Work 05/02/2016,  1:50 PM

## 2016-05-02 NOTE — Progress Notes (Signed)
CH responded to an OR for an AD. Pt was awake and alert. Pt was educated on the AD. Pt wanted to have other family members to look over the document before completion. [place] left materials with Pt for further review. CH is available for follow up as needed.    05/02/16 1100  Clinical Encounter Type  Visited With Patient  Visit Type Initial;Spiritual support  Referral From Nurse  Spiritual Encounters  Spiritual Needs Literature

## 2016-05-02 NOTE — Evaluation (Signed)
Occupational Therapy Evaluation Patient Details Name: Curtis Powell MRN: [id number] DOB: 09/23/1965 Today's Date: 05/02/2016    History of Present Illness Pt admitted for complaints of L side weakness. 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   Clinical Impression   Pt seen for OT evaluation this date. Pt up in recliner after lunch, mother in room for duration of session, and neurologist present for portion of session. Pt presents with R side weakness, decreased strength and gross/fine motor coordination deficits for functional ADL tasks with R side (R hand dominant), and increased need for assistance for self care tasks including bathing, dressing, self feeding, and grooming. Pt lives alone however not completely independent at baseline in "handicapped accessible" apartment, requiring home aide to assist with meals, medications, and some self care bathing/dress as well as mother who comes by every other day at baseline to check on pt and to assist with laundry and drive pt to appointments/groceries. Pt would benefit from skilled OT services to address noted impairments and functional deficits in order to maximize functional return to PLOF and independence with self care tasks in order to return home safely. Recommending CIR to support pt progress and goals.     Follow Up Recommendations  CIR    Equipment Recommendations  Tub/shower bench;Other (comment) (current TTB at home is unsafe/broken; hh shower head, suction mount)    Recommendations for Other Services Rehab consult     Precautions / Restrictions Precautions Precautions: Fall Restrictions Weight Bearing Restrictions: No      Mobility Bed Mobility Overal bed mobility: Needs Assistance Bed Mobility: Supine to Sit     Supine to sit: Min assist     General bed mobility comments: did  not assess, pt up in recliner at time of OT session; defer to PT evaluation  Transfers Overall transfer level: Needs assistance Equipment used: Rolling walker (2 wheeled) Transfers: Sit to/from Stand Sit to Stand: Min assist         General transfer comment: did not assess, pt up in recliner at time of OT session; defer to PT evaluation; did not transfer due to pt fatigue    Balance Overall balance assessment: History of Falls;Needs assistance Sitting-balance support: Feet supported Sitting balance-Leahy Scale: Fair     Standing balance support: Bilateral upper extremity supported Standing balance-Leahy Scale: Fair                              ADL Overall ADL's : Needs assistance/impaired Eating/Feeding: Supervision/ safety;Sitting;Minimal assistance;With adaptive utensils Eating/Feeding Details (indicate cue type and reason): pt/mother report increased difficulty with self feeding requiring min assist from mother to cut meat, open packages; pt reports difficulty holding utensils steady with large amount of spillage (evidenced by stains on gown) Grooming: Oral care;Sitting;Supervision/safety;Set up;Minimal assistance Grooming Details (indicate cue type and reason): pt required min assist to squeeze toothpaste onto toothbrush using R hand to squeeze due to decreased strength, decreased coordination using R hand to brush, switched to L hand and coordination improved slightly (pt is right handed so brushing with L hand was more challenging) Upper Body Bathing: Minimal assistance;Sitting;Set up   Lower Body Bathing: Sit to/from stand;Sitting/lateral leans;Maximal assistance   Upper Body Dressing : Set up;Sitting;Supervision/safety   Lower Body Dressing: Moderate assistance;Sitting/lateral leans;Sit to/from stand;Cueing for  Production manager Details (indicate cue type and reason): did not attempt due to pt fatigue                  Vision Vision  Assessment?: No apparent visual deficits   Perception     Praxis      Pertinent Vitals/Pain Pain Assessment: No/denies pain     Hand Dominance Right   Extremity/Trunk Assessment Upper Extremity Assessment Upper Extremity Assessment: RUE deficits/detail RUE Deficits / Details: grossly 3+/5 strength, decreased coordination, ROM WFL; unable to squeeze toothpaste using lateral pinch  RUE Coordination: decreased fine motor;decreased gross motor   Lower Extremity Assessment Lower Extremity Assessment: Defer to PT evaluation RLE Deficits / Details: R LE dorsiflexion 3/5; knee flexion 4-/5; hip flexion 4/5 RLE Sensation:  (WFL) RLE Coordination: decreased fine motor       Communication Communication Communication: Expressive difficulties   Cognition Arousal/Alertness: Awake/alert Behavior During Therapy: WFL for tasks assessed/performed Overall Cognitive Status: History of cognitive impairments - at baseline                 General Comments: pt had mild speech difficulties prior to admission secondary to past medical history, per SLP report pt signs name with an "X"   General Comments       Exercises Exercises: Other exercises Other Exercises Other Exercises: pt/mother educated in home mods to maximize safety/functional independence in home environment (grab bars, tub transfer bench, handheld shower head with on/off switch, suction showerhead mount)   Shoulder Instructions      Home Living Family/patient expects to be discharged to:: Private residence Living Arrangements: Alone Available Help at Discharge: Personal care attendant;Family (assists with meals, supervision for tub transfers, min assist to wash back, mod assist for LB dressing tasks, sets up weekly medication pill box; mother comes by every other day at baseline to check on pt) Type of Home: Apartment (handicapped accessible apartment on main floor) Home Access: Stairs to enter Entrance Stairs-Number of  Steps: 1 Entrance Stairs-Rails: None Home Layout: One level     Bathroom Shower/Tub: Tub/shower unit Shower/tub characteristics: Architectural technologist: Handicapped height (pt and mother report toilet is unstable, apartment complex won't fix) Bathroom Accessibility: Yes How Accessible: Accessible via walker Home Equipment: Walker - 2 wheels;Cane - single point;Bedside commode;Tub bench (pt/mother report TTB is broken but he was using at baseline)          Prior Functioning/Environment Level of Independence: Needs assistance  Gait / Transfers Assistance Needed: indep with SPC for household mobility ADL's / Homemaking Assistance Needed: mother assists with weekly laundry and drives pt to grocery store; health aide assists with meals, min A UB bathing, mod A LB dressing, manages medications   Comments: using SPC for all household mobility        OT Problem List: Decreased strength;Decreased coordination;Decreased activity tolerance;Decreased knowledge of use of DME or AE;Impaired balance (sitting and/or standing);Impaired UE functional use   OT Treatment/Interventions: Self-care/ADL training;Therapeutic exercise;Neuromuscular education;Energy conservation;DME and/or AE instruction;Patient/family education    OT Goals(Current goals can be found in the care plan section) Acute Rehab OT Goals Patient Stated Goal: to get stronger OT Goal Formulation: With patient/family Time For Goal Achievement: 05/16/16 Potential to Achieve Goals: Good  OT Frequency: Min 2X/week   Barriers to D/C: Decreased caregiver support;Inaccessible home environment  see above for home set up; pt requires home aide and mother's assist for ADL and community activities       Co-evaluation  End of Session Nurse Communication: Other (comment) (nsg notified of mother's request for witness for HCPOA)  Activity Tolerance: Patient limited by fatigue Patient left: in chair;with call bell/phone  within reach;with chair alarm set;with family/visitor present   Time: [id number] OT Time Calculation (min): 55 min Charges:  OT General Charges $OT Visit: 1 Procedure OT Evaluation $OT Eval Moderate Complexity: 1 Procedure OT Treatments $Self Care/Home Management : 38-52 mins G-Codes:    Corky Sox, OTR/L 05/02/2016, 3:36 PM

## 2016-05-02 NOTE — Plan of Care (Signed)
Problem: Education: Goal: Knowledge of disease or condition will improve Outcome: Progressing edu to mother also  Problem: Tissue Perfusion: Goal: Cerebral tissue perfusion will improve (applicable to all stroke diagnoses) Outcome: Progressing Started plavix and  lipitor asa

## 2016-05-02 NOTE — Progress Notes (Signed)
Rehab Admissions Coordinator Note:  Patient was screened by Cleatrice Burke for appropriateness for an Inpatient Acute Rehab admission per PT recommendation.  Pt looks like he is appropriate for an inpt rehab admission, but no bed is available this week for this pt. I will update RN CM, Jeannie Fend.  Cleatrice Burke 05/02/2016, 12:37 PM  I can be reached at [phone].

## 2016-05-03 DIAGNOSIS — R29898 Other symptoms and signs involving the musculoskeletal system: Secondary | ICD-10-CM | POA: Diagnosis not present

## 2016-05-03 DIAGNOSIS — I639 Cerebral infarction, unspecified: Secondary | ICD-10-CM | POA: Diagnosis not present

## 2016-05-03 LAB — PROTEIN S, TOTAL: Protein S Ag, Total: 109 % (ref 60–150)

## 2016-05-03 LAB — ANTINUCLEAR ANTIBODIES, IFA: ANA Ab, IFA: NEGATIVE

## 2016-05-03 LAB — HOMOCYSTEINE: Homocysteine: 9.4 umol/L (ref 0.0–15.0)

## 2016-05-03 LAB — GLUCOSE, CAPILLARY: Glucose-Capillary: 95 mg/dL (ref 65–99)

## 2016-05-03 LAB — PROTEIN C, TOTAL: Protein C, Total: 80 % (ref 60–150)

## 2016-05-03 LAB — HEMOGLOBIN A1C
Hgb A1c MFr Bld: 6 % — ABNORMAL HIGH (ref 4.8–5.6)
Mean Plasma Glucose: 126 mg/dL

## 2016-05-03 LAB — LUPUS ANTICOAGULANT PANEL
DRVVT: 32.3 s (ref 0.0–47.0)
PTT Lupus Anticoagulant: 46.8 s (ref 0.0–51.9)

## 2016-05-03 MED ORDER — CLOPIDOGREL BISULFATE 75 MG PO TABS: 75.0000 mg | ORAL_TABLET | Freq: Every day | ORAL | 0 refills | Status: AC

## 2016-05-03 MED ORDER — SENNOSIDES-DOCUSATE SODIUM 8.6-50 MG PO TABS: 1.0000 | ORAL_TABLET | Freq: Every evening | ORAL | 0 refills | Status: AC | PRN

## 2016-05-03 MED ORDER — ATORVASTATIN CALCIUM 40 MG PO TABS: 40.0000 mg | ORAL_TABLET | Freq: Every day | ORAL | 0 refills | Status: DC

## 2016-05-03 NOTE — Discharge Summary (Signed)
[facility] NAME: Curtis Powell    MR#:  [id number]  DATE OF BIRTH:  1965-04-24  DATE OF ADMISSION:  05/01/2016 ADMITTING PHYSICIAN: Demetrios Loll, MD  DATE OF DISCHARGE: 05/03/2016  PRIMARY CARE PHYSICIAN: No PCP Per Patient    ADMISSION DIAGNOSIS:  Weakness of left leg [R29.898]  DISCHARGE DIAGNOSIS:  Active Problems:   Left-sided weakness   Acute CVA (cerebrovascular accident) ([place])     SECONDARY DIAGNOSIS:   Past Medical History:  Diagnosis Date  . GERD (gastroesophageal reflux disease)   . Hyperlipidemia   . Hypertension   . Obesity   . Stroke [facility])     HOSPITAL COURSE:   * Acute stroke   This is confirmed on MRI brain.   He had a stroke 8 years ago.   Had GI bleed in past with aspirin.   Appreciated neurology help.   Ordered hypercoagulable work ups.   Echo and carotid doppler.   Statin.   Neuro suggest to change to plavix.   Rehab placement and Dysphagia 3 diet with ongoing speech and swellow eval for next 3 weeks.  * Hypertension. Hold HCTZ and lisinopril due to low side blood pressure. * Hyperlipidemia. Continue Lipitor. * Obesity. * GERD. Protonix.   DISCHARGE CONDITIONS:   Stable.  CONSULTS OBTAINED:  Treatment Team:  Catarina Hartshorn, MD Alexis Goodell, MD  DRUG ALLERGIES:  No Known Allergies  DISCHARGE MEDICATIONS:   Current Discharge Medication List    START taking these medications   Details  atorvastatin (LIPITOR) 40 MG tablet Take 1 tablet (40 mg total) by mouth daily at 6 PM. Qty: 30 tablet, Refills: 0    clopidogrel (PLAVIX) 75 MG tablet Take 1 tablet (75 mg total) by mouth daily. Qty: 30 tablet, Refills: 0    senna-docusate (SENOKOT-S) 8.6-50 MG tablet Take 1 tablet by mouth at bedtime as needed for mild constipation. Qty: 20 tablet, Refills: 0      CONTINUE these medications which have NOT CHANGED   Details  multivitamin-iron-minerals-folic acid (CENTRUM)  chewable tablet Chew 1 tablet by mouth daily.    omeprazole (PRILOSEC) 20 MG capsule Take 20 mg by mouth daily.    cyclobenzaprine (FLEXERIL) 10 MG tablet Take 1 tablet (10 mg total) by mouth every 8 (eight) hours as needed for muscle spasms. Qty: 15 tablet, Refills: 0    traMADol (ULTRAM) 50 MG tablet Take 1 tablet (50 mg total) by mouth every 6 (six) hours as needed for moderate pain. Qty: 12 tablet, Refills: 0      STOP taking these medications     aspirin EC 81 MG tablet      hydrochlorothiazide (HYDRODIURIL) 12.5 MG tablet      lisinopril (PRINIVIL,ZESTRIL) 5 MG tablet          DISCHARGE INSTRUCTIONS:    Follow with neurologist in 2-3 weeks.  If you experience worsening of your admission symptoms, develop shortness of breath, life threatening emergency, suicidal or homicidal thoughts you must seek medical attention immediately by calling 911 or calling your MD immediately  if symptoms less severe.  You Must read complete instructions/literature along with all the possible adverse reactions/side effects for all the Medicines you take and that have been prescribed to you. Take any new Medicines after you have completely understood and accept all the possible adverse reactions/side effects.   Please note  You were cared for by a hospitalist during your hospital stay. If you have any  questions about your discharge medications or the care you received while you were in the hospital after you are discharged, you can call the unit and asked to speak with the hospitalist on call if the hospitalist that took care of you is not available. Once you are discharged, your primary care physician will handle any further medical issues. Please note that NO REFILLS for any discharge medications will be authorized once you are discharged, as it is imperative that you return to your primary care physician (or establish a relationship with a primary care physician if you do not have one) for your  aftercare needs so that they can reassess your need for medications and monitor your lab values.    Today   CHIEF COMPLAINT:   Chief Complaint  Patient presents with  . Back Pain  . Weakness    HISTORY OF PRESENT ILLNESS:  Curtis Powell  is a 51 y.o. male with a known history of CVA, hypertension, hyperlipidemia and GERD. The patient has a history of brain surgery in childhood and stroke later. He presented to the ED with left leg weakness and slurred speech. The patient denies any other symptoms. CAT scan of head is negative.   VITAL SIGNS:  Blood pressure 136/74, pulse 81, temperature 98 F (36.7 C), temperature source Oral, resp. rate (!) 22, height 5' (1.524 m), weight 108.9 kg (240 lb), SpO2 97 %.  I/O:    Intake/Output Summary (Last 24 hours) at 05/03/16 1012 Last data filed at 05/03/16 0447  Gross per 24 hour  Intake              240 ml  Output              775 ml  Net             -535 ml    PHYSICAL EXAMINATION:   GENERAL:  51 y.o.-year-old patient lying in the bed with no acute distress.  EYES: Pupils equal, round, reactive to light and accommodation. No scleral icterus. Extraocular muscles intact.  HEENT: Head atraumatic, normocephalic. Oropharynx and nasopharynx clear.  NECK:  Supple, no jugular venous distention. No thyroid enlargement, no tenderness.  LUNGS: Normal breath sounds bilaterally, no wheezing, rales,rhonchi or crepitation. No use of accessory muscles of respiration.  CARDIOVASCULAR: S1, S2 normal. No murmurs, rubs, or gallops.  ABDOMEN: Soft, nontender, nondistended. Bowel sounds present. No organomegaly or mass.  EXTREMITIES: No pedal edema, cyanosis, or clubbing.  NEUROLOGIC: face have weakness on right side. Muscle strength 5/5 in left extremities and 3 /5 in right side both extremities. Sensation intact. Gait not checked.  PSYCHIATRIC: The patient is alert and oriented x 3.  SKIN: No obvious rash, lesion, or ulcer.   DATA REVIEW:    CBC  Recent Labs Lab 05/01/16 1215  WBC 8.9  HGB 14.3  HCT 43.2  PLT 205    Chemistries   Recent Labs Lab 05/01/16 1215  NA 139  K 4.0  CL 106  CO2 28  GLUCOSE 94  BUN 14  CREATININE 0.99  CALCIUM 9.4    Cardiac Enzymes  Recent Labs Lab 05/01/16 1215  [place] <0.03    Microbiology Results  No results found for this or any previous visit.  RADIOLOGY:  Ct Head Wo Contrast  Result Date: 05/01/2016 CLINICAL DATA:  Slurred speech, left lower extremity weakness. EXAM: CT HEAD WITHOUT CONTRAST TECHNIQUE: Contiguous axial images were obtained from the base of the skull through the vertex without intravenous contrast. COMPARISON:  CT scan of June 06, 2011. FINDINGS: Brain: Mild diffuse cortical atrophy is noted. Mild chronic ischemic white matter disease is noted. Stable cortical calcifications are noted in the cerebellum and both occipital lobes, as well small amount seen in left frontal lobe. This may represent chronic sequela from previous remote infection. No mass effect or midline shift is noted. Ventricular size is within normal limits. There is no evidence of mass lesion, hemorrhage or acute infarction. Vascular: Atherosclerosis of carotid siphons is noted. Skull: Normal. Negative for fracture or focal lesion. Sinuses/Orbits: Stable left sphenoid mucous retention cyst. Other: None. IMPRESSION: Mild diffuse cortical atrophy. Mild chronic ischemic white matter disease. No acute intracranial abnormality seen. Stable cortical calcifications seen in the cerebellum and both occipital lobes which may represent chronic sequela from previous infection. Electronically Signed   By: Marijo Conception, M.D.   On: 05/01/2016 15:34   Mr Brain Wo Contrast  Result Date: 05/02/2016 CLINICAL DATA:  Left leg weakness and slurred speech. History of brain surgery in childhood. EXAM: MRI HEAD WITHOUT CONTRAST MRA HEAD WITHOUT CONTRAST TECHNIQUE: Multiplanar, multiecho pulse sequences of  the brain and surrounding structures were obtained without intravenous contrast. Angiographic images of the head were obtained using MRA technique without contrast. COMPARISON:  CT head 05/01/2016, 05/29/2011, 10/03/2006 FINDINGS: MRI HEAD FINDINGS Brain: Acute infarct in the left paramedian pons measuring 7 mm. No other acute infarct identified. Moderate atrophy. Moderate cerebellar atrophy bilaterally. Negative for hydrocephalus. Hyperintensity throughout the periventricular and deep white matter bilaterally. There is volume loss most prominent in the frontal lobes. No basal ganglia infarction. Review of the prior CT scans reveals extensive calcification in the brain including the cerebellum bilaterally and in the occipital lobes bilaterally. Small amount of calcification in the right basal ganglia without significant calcification in the left basal ganglia. Suboccipital craniectomy. Negative for Chiari malformation. Possible small syrinx in the cervical spinal cord. Extra-axial soft tissue mass over the left convexity. This is compatible with meningioma and measures approximately 2 cm in diameter. No edema in the adjacent brain. Vascular: Normal arterial flow voids. Skull and upper cervical spine: No acute skeletal abnormality. Sinuses/Orbits: Mucosal edema left sphenoid sinus. Remaining sinuses clear. Normal orbit. Other: None MRA HEAD FINDINGS Mild irregularity of the distal vertebral artery bilaterally without significant stenosis. Mild irregularity proximal basilar without significant stenosis. AICA patent bilaterally. PICA not visualized. Superior cerebellar arteries are patent with moderate stenosis proximally on the right. Mild irregularity of the posterior cerebral arteries bilaterally without significant stenosis. Focal moderate stenosis right cavernous carotid. Small right A1 segment. Right A2 segment appears occluded proximally. Moderate to severe stenosis right M1. Right middle cerebral artery  branches demonstrate mild stenosis at the bifurcation. Left cavernous carotid with mild stenosis . Left anterior cerebral artery patent. Left middle cerebral artery patent with mild stenosis in the left M1 segment and mild to moderate stenosis left MCA bifurcation. Negative for cerebral aneurysm. IMPRESSION: 7 mm acute infarct left paramedian pons Moderate cerebral and cerebellar atrophy as noted on prior studies. There is extensive hyperintensity throughout the cerebral white matter bilaterally with volume loss in the frontal lobes. Extensive calcification is present in the brain most notably in the cerebellum and occipital lobes bilaterally. Relative sparing of the basal ganglia. Findings are suggestive of chronic insult with secondary calcification. This is most likely due to chronic infection in the brain. Calcium metabolism abnormalities such as hypoparathyroidism or hyperparathyroidism or possible. Fahr's syndrome not considered as likely given the relative sparing of the basal  ganglia. 2 cm left convexity meningioma without brain edema. Intracranial atherosclerotic disease as above. There is moderately severe atherosclerotic disease with multiple areas of stenosis most notably in the right M1 segment. There is occlusion of the right A2 segment. Electronically Signed   By: Franchot Gallo M.D.   On: 05/02/2016 10:31   US Carotid Bilateral (at [facility])  Result Date: 05/02/2016 CLINICAL DATA:  Left-sided weakness. EXAM: BILATERAL CAROTID DUPLEX ULTRASOUND TECHNIQUE: Pearline Cables scale imaging, color Doppler and duplex ultrasound were performed of bilateral carotid and vertebral arteries in the neck. COMPARISON:  CT 05/01/2016 . FINDINGS: Criteria: Quantification of carotid stenosis is based on velocity parameters that correlate the residual internal carotid diameter with NASCET-based stenosis levels, using the diameter of the distal internal carotid lumen as the denominator for stenosis measurement. The  following velocity measurements were obtained: RIGHT ICA:  64/24 Cm/sec CCA:  [id number] cm/sec SYSTOLIC ICA/CCA RATIO:  0.9 DIASTOLIC ICA/CCA RATIO:  1.4 ECA:  60 cm/sec LEFT ICA:  60/23 cm/sec CCA:  [id number] cm/sec SYSTOLIC ICA/CCA RATIO:  0.6 DIASTOLIC ICA/CCA RATIO:  1.5 ECA:  58 cm/sec RIGHT CAROTID ARTERY: Mild punctate plaque right carotid bifurcation. No flow limiting stenosis. RIGHT VERTEBRAL ARTERY:  Patent with antegrade flow. LEFT CAROTID ARTERY: Mild punctate plaque left carotid bifurcation. No flow limiting stenosis. LEFT VERTEBRAL ARTERY:  Patent with antegrade flow. Multiple solid thyroid nodules are noted throughout both thyroid lobes largest on right measures 4.4 cm maximum diameter, and on the left 2.7 cm in maximum diameter. Dedicated thyroid ultrasound suggested for further evaluation. IMPRESSION: 1. Mild bilateral punctate carotid bifurcation atherosclerotic vascular plaque. No flow limiting stenosis. Degree of stenosis less than 50% bilaterally. 2. Vertebral artery patent antegrade flow. 3. Multiple large solid thyroid nodules noted throughout both thyroid lobes. Dedicated thyroid ultrasound suggested for further evaluation. Electronically Signed   By: Marcello Moores  Register   On: 05/02/2016 08:49   Mr Jodene Nam Head/brain Wo Cm  Result Date: 05/02/2016 CLINICAL DATA:  Left leg weakness and slurred speech. History of brain surgery in childhood. EXAM: MRI HEAD WITHOUT CONTRAST MRA HEAD WITHOUT CONTRAST TECHNIQUE: Multiplanar, multiecho pulse sequences of the brain and surrounding structures were obtained without intravenous contrast. Angiographic images of the head were obtained using MRA technique without contrast. COMPARISON:  CT head 05/01/2016, 05/29/2011, 10/03/2006 FINDINGS: MRI HEAD FINDINGS Brain: Acute infarct in the left paramedian pons measuring 7 mm. No other acute infarct identified. Moderate atrophy. Moderate cerebellar atrophy bilaterally. Negative for hydrocephalus. Hyperintensity throughout  the periventricular and deep white matter bilaterally. There is volume loss most prominent in the frontal lobes. No basal ganglia infarction. Review of the prior CT scans reveals extensive calcification in the brain including the cerebellum bilaterally and in the occipital lobes bilaterally. Small amount of calcification in the right basal ganglia without significant calcification in the left basal ganglia. Suboccipital craniectomy. Negative for Chiari malformation. Possible small syrinx in the cervical spinal cord. Extra-axial soft tissue mass over the left convexity. This is compatible with meningioma and measures approximately 2 cm in diameter. No edema in the adjacent brain. Vascular: Normal arterial flow voids. Skull and upper cervical spine: No acute skeletal abnormality. Sinuses/Orbits: Mucosal edema left sphenoid sinus. Remaining sinuses clear. Normal orbit. Other: None MRA HEAD FINDINGS Mild irregularity of the distal vertebral artery bilaterally without significant stenosis. Mild irregularity proximal basilar without significant stenosis. AICA patent bilaterally. PICA not visualized. Superior cerebellar arteries are patent with moderate stenosis proximally on the right. Mild irregularity of the posterior  cerebral arteries bilaterally without significant stenosis. Focal moderate stenosis right cavernous carotid. Small right A1 segment. Right A2 segment appears occluded proximally. Moderate to severe stenosis right M1. Right middle cerebral artery branches demonstrate mild stenosis at the bifurcation. Left cavernous carotid with mild stenosis . Left anterior cerebral artery patent. Left middle cerebral artery patent with mild stenosis in the left M1 segment and mild to moderate stenosis left MCA bifurcation. Negative for cerebral aneurysm. IMPRESSION: 7 mm acute infarct left paramedian pons Moderate cerebral and cerebellar atrophy as noted on prior studies. There is extensive hyperintensity throughout the  cerebral white matter bilaterally with volume loss in the frontal lobes. Extensive calcification is present in the brain most notably in the cerebellum and occipital lobes bilaterally. Relative sparing of the basal ganglia. Findings are suggestive of chronic insult with secondary calcification. This is most likely due to chronic infection in the brain. Calcium metabolism abnormalities such as hypoparathyroidism or hyperparathyroidism or possible. Fahr's syndrome not considered as likely given the relative sparing of the basal ganglia. 2 cm left convexity meningioma without brain edema. Intracranial atherosclerotic disease as above. There is moderately severe atherosclerotic disease with multiple areas of stenosis most notably in the right M1 segment. There is occlusion of the right A2 segment. Electronically Signed   By: Franchot Gallo M.D.   On: 05/02/2016 10:31    EKG:   Orders placed or performed during the hospital encounter of 05/01/16  . ED EKG  . ED EKG  . EKG 12-Lead  . EKG 12-Lead      Management plans discussed with the patient, family and they are in agreement.  CODE STATUS: Full.    Code Status Orders        Start     Ordered   05/01/16 1753  Full code  Continuous     05/01/16 1752    Code Status History    Date Active Date Inactive Code Status Order ID Comments User Context   This patient has a current code status but no historical code status.      TOTAL TIME TAKING CARE OF THIS PATIENT: 35 minutes.    Vaughan Basta M.D on 05/03/2016 at 10:12 AM  Between 7am to 6pm - Pager - [phone]  After 6pm go to [email or web address] - password [email or web address] [facility]  Office  [phone]  CC: Primary care physician; No PCP Per Patient   Note: This dictation was prepared with Dragon dictation along with smaller phrase technology. Any transcriptional errors that result from this process are unintentional.

## 2016-05-03 NOTE — Evaluation (Signed)
Clinical/Bedside Swallow Evaluation Patient Details  Name: Curtis Powell MRN: [id number] Date of Birth: Apr 21, 1965  Today's Date: 05/03/2016 Time: SLP Start Time (ACUTE ONLY): 0805 SLP Stop Time (ACUTE ONLY): 0905 SLP Time Calculation (min) (ACUTE ONLY): 60 min  Past Medical History:  Past Medical History:  Diagnosis Date  . GERD (gastroesophageal reflux disease)   . Hyperlipidemia   . Hypertension   . Obesity   . Stroke [facility])    Past Surgical History:  Past Surgical History:  Procedure Laterality Date  . BRAIN SURGERY     HPI:  Pt is a 51 y.o. male with a h/o brain surgery in childhood and stroke later suspect related to past radiation treatment who presents with new onset right sided weakness and dysarthria.  MRI of the brain reviewed and shows an acute left paramedian pontine infarct.  Likely due to small vessel disease.  Patient on ASA at home.  Unclear what work up was performed in the past.  Hypercoagulable work up recommended.  Carotid dopplers show no evidence of hemodynamically significant stenosis.  Echocardiogram shows no cardiac source of emboli with an EF of 65-70%.  A1c pending, LDL 167.    Assessment / Plan / Recommendation Clinical Impression  Pt appears to present w/ mild oral phase dysphagia c/b slight-mild R oral-lingual weakness impacting bolus control and management w/ increased textures - min lengthier oral phase time for bolus management and full clearing. Pt was instructed to alternate foods w/ more moist foods and/or small sip of thin liquids. Pt was able to follow through w/ instruction w/ min verbal cues, encouragement. No overt s/s of aspiration was noted noted; clear vocal quality b/t trials. Pt was able to feed self w/ tray setup and using the builtup handle for the utensil d/t having to use his non-dominent hand to feed himself(Left) secondary to RUE weakness. Pt does have a baseline h/o brain surgery in childhood and stroke later suspect related to past  radiation treatment and suspect would benefit from monitoring at meals. Noted assistance is given in the home w/ ADLs baseline. Pt would benefit from a Dysphagia 3 diet w/ meats chopped and moistened; thin liquids. Recommend aspiration precautions and Pills given in Puree for easier swallowing. Pt would benefit from monitoring at meals for cues for follow through w/ aspiration precautions (including SMALL bites/sips; clearing mouth fully b/t bites, eating slowly).    Aspiration Risk   (reduced)    Diet Recommendation  Dysphagia level 3 w/ moistened meats; Thin liquids.  General aspiration precautions; would benefit from supervision/monitoring at meals for follow through w/ aspiration precautions  Medication Administration: Whole meds with puree    Other  Recommendations Recommended Consults:  (Dietician f/u as needed) Oral Care Recommendations: Oral care BID;Staff/trained caregiver to provide oral care   Follow up Recommendations Skilled Nursing facility      Frequency and Duration min 2x/week  1 week       Prognosis Prognosis for Safe Diet Advancement: Good Barriers to Reach Goals: Cognitive deficits      Swallow Study   General Date of Onset: 05/01/16 HPI: Pt is a 50 y.o. male with a h/o brain surgery in childhood and stroke later suspect related to past radiation treatment who presents with new onset right sided weakness and dysarthria.  MRI of the brain reviewed and shows an acute left paramedian pontine infarct.  Likely due to small vessel disease.  Patient on ASA at home.  Unclear what work up was performed in the  past.  Hypercoagulable work up recommended.  Carotid dopplers show no evidence of hemodynamically significant stenosis.  Echocardiogram shows no cardiac source of emboli with an EF of 65-70%.  A1c pending, LDL 167.  Type of Study: Bedside Swallow Evaluation Previous Swallow Assessment: none Diet Prior to this Study: Regular;Thin liquids Temperature Spikes Noted: No (wbc  not elevated) Respiratory Status: Room air History of Recent Intubation: No Behavior/Cognition: Alert;Cooperative;Pleasant mood;Requires cueing (min) Oral Cavity Assessment: Within Functional Limits Oral Care Completed by SLP: Recent completion by staff Oral Cavity - Dentition: Missing dentition (molars on Left side) Vision: Functional for self-feeding Self-Feeding Abilities: Able to feed self;Able to feed self with adaptive devices;Needs assist;Needs set up (using his Left hand d/t RUE weakness(new onset)) Patient Positioning: Upright in bed Baseline Vocal Quality: Normal;Low vocal intensity (min decreased breath support ) Volitional Cough: Strong Volitional Swallow: Able to elicit    Oral/Motor/Sensory Function Overall Oral Motor/Sensory Function: Mild impairment Facial ROM: Reduced right (slight) Facial Symmetry: Abnormal symmetry right (slight) Facial Strength: Reduced right Lingual ROM: Within Functional Limits Lingual Symmetry: Within Functional Limits Lingual Strength: Reduced (slightly) Velum: Impaired right (slight) Mandible: Within Functional Limits   Ice Chips Ice chips: Within functional limits Presentation: Spoon (fed; 2 trials)   Thin Liquid Thin Liquid: Within functional limits Presentation: Cup;Self Fed;Straw (~8 ozs)    Nectar Thick Nectar Thick Liquid: Not tested   Honey Thick Honey Thick Liquid: Not tested   Puree Puree: Within functional limits Presentation: Self Fed;Spoon (5 trials)   Solid   GO   Solid: Impaired Presentation: Self Fed;Spoon (7-8 trials) Oral Phase Impairments: Impaired mastication;Reduced lingual movement/coordination (mild+) Oral Phase Functional Implications: Prolonged oral transit;Impaired mastication (prolonged oral phase time for bolus management) Pharyngeal Phase Impairments:  (none)         Orinda Kenner, MS, CCC-SLP Devarious Pavek 05/03/2016,11:27 AM

## 2016-05-03 NOTE — Discharge Instructions (Signed)
DYsphagia 3 diet. He also need continued evaluation by speech therapist at rehab for next 2-3 weeks.

## 2016-05-03 NOTE — Progress Notes (Signed)
Patient is medically stable for D/C to [facility] today. Per [facility] liaison patient will go to room 807. RN will call report to RN Theressa Stamps at [phone] and arrange EMS for transport. [employer] authorization has been received. Auth # [id number], RUB, next review date 05/05/16, recommended 651 therapy minutes per week. Clinical Education officer, museum (CSW) sent D/C orders to [facility] via [facility]. Patient is aware of above. CSW contacted patient's mother Lendell Caprice and made her aware of above. Please reconsult if future social work needs arise. CSW signing off.   [employer], LCSW [phone]

## 2016-05-03 NOTE — Progress Notes (Signed)
Physical Therapy Treatment Patient Details Name: Curtis Powell MRN: [id number] DOB: Mar 19, 1966 Today's Date: 05/03/2016    History of Present Illness Pt admitted for complaints of L side weakness. 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    PT Comments    Pt is making good progress towards goals with increased strength noted in R LE. Pt still struggles with OOB mobility, requiring +2 assist at this time. Pt performed 5 time sit<>Stand in 26 seconds demonstrating decreased power/control as well as increased risk for falls. Needed +2 for safety during testing. Short ambulation in room performed with +2 assist secondary to decreased strength/balance deficits. Still has difficulty stepping with R foot. Will continue to progress.  Follow Up Recommendations  CIR     Equipment Recommendations       Recommendations for Other Services       Precautions / Restrictions Precautions Precautions: Fall Restrictions Weight Bearing Restrictions: No    Mobility  Bed Mobility Overal bed mobility: Needs Assistance Bed Mobility: Supine to Sit     Supine to sit: Min assist     General bed mobility comments: Pt needs assist for scooting out towards EOB. Pt able to reach for railing for assistance. Once seated at EOB, pt able to sit with upright posture.  Transfers Overall transfer level: Needs assistance Equipment used: Rolling walker (2 wheeled) Transfers: Sit to/from Stand Sit to Stand: Mod assist;+2 physical assistance         General transfer comment: Attempted transfer with +1 max assist. Pt able to initiate transfer, however secondary to balance deficits, post lean noted. Unable to fully stand upright. 2nd attempt performed with +2 and mod assist with upright posture noted. Increased post lateral sway with upright  posture.  Ambulation/Gait Ambulation/Gait assistance: Mod assist;+2 physical assistance Ambulation Distance (Feet): 7 Feet Assistive device: Rolling walker (2 wheeled) Gait Pattern/deviations: Step-to pattern     General Gait Details: Pt still unable to step with either foot; instead slides foot in order to step. Seated rest break required after 5' of ambulation. Additional 2' of ambulation performed however pt fatigues quickly and needs seated rest break in chair. LOB noted. +2 assist required.   Stairs            Wheelchair Mobility    Modified Rankin (Stroke Patients Only)       Balance                                    Cognition Arousal/Alertness: Awake/alert Behavior During Therapy: WFL for tasks assessed/performed Overall Cognitive Status: History of cognitive impairments - at baseline                      Exercises Other Exercises Other Exercises: seated ther-ex performed on B LE including ankle pumps, quad sets, SLRs, and heel slides. Needs min/mod assist for R LE and cga for L LE. All ther-ex performed x 12 reps with cues for correct technique.    General Comments        Pertinent Vitals/Pain Pain Assessment: No/denies pain    Home Living                      Prior Function  PT Goals (current goals can now be found in the care plan section) Acute Rehab PT Goals Patient Stated Goal: to get stronger PT Goal Formulation: With patient Time For Goal Achievement: 05/16/16 Potential to Achieve Goals: Good Progress towards PT goals: Progressing toward goals    Frequency    7X/week      PT Plan Current plan remains appropriate    Co-evaluation             End of Session Equipment Utilized During Treatment: Gait belt Activity Tolerance: Patient tolerated treatment well Patient left: in chair;with chair alarm set     Time: [id number] PT Time Calculation (min) (ACUTE ONLY): 24 min  Charges:   $Gait Training: 8-22 mins $Therapeutic Exercise: 8-22 mins                    G Codes:      [name] 06-02-2016, 11:37 AM  Greggory Stallion, PT, DPT [phone]

## 2016-05-03 NOTE — Evaluation (Signed)
Speech Language Pathology Evaluation Patient Details Name: Curtis Powell MRN: [id number] DOB: 1965-10-03 Today's Date: 05/03/2016 Time: [id number] SLP Time Calculation (min) (ACUTE ONLY): 35 min  Problem List:  Patient Active Problem List   Diagnosis Date Noted  . Acute CVA (cerebrovascular accident) ([place]) 05/02/2016  . Left-sided weakness 05/01/2016   Past Medical History:  Past Medical History:  Diagnosis Date  . GERD (gastroesophageal reflux disease)   . Hyperlipidemia   . Hypertension   . Obesity   . Stroke [facility])    Past Surgical History:  Past Surgical History:  Procedure Laterality Date  . BRAIN SURGERY     HPI:  51 y.o. 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  MRI of the brain reviewed and shows an acute left paramedian pontine infarct.  Likely due to small vessel disease.  Patient on ASA at home.  Unclear what work up was performed in the past.  Hypercoagulable work up recommended.  Carotid dopplers show no evidence of hemodynamically significant stenosis.  Echocardiogram shows no cardiac source of emboli with an EF of 65-70%.  A1c pending, LDL 167.   Assessment / Plan / Recommendation Clinical Impression  51 year old man with new onset of left pontine CVA is presenting with mild dysarthria characterized by right facial droop, imprecise articulation (secondary decreased ROM, strength, and coordination of muscles of articulation), hypernasality, and hoarse/weak vocal quality.  Speech is intelligible but clearly dysarthric.  The patient is able to improve intelligibility, resonance, and vocal quality with slowed rate, over-articulation, and increased loudness.  If patient has difficulty independently implementing speech strategies he would benefit from referral to speech therapy after discharge.        SLP Assessment  Patient needs continued Speech Lanaguage Pathology Services     Follow Up Recommendations   (SLP after discharge if patient cannot generalize strategies)    Frequency and Duration min 3x week         SLP Evaluation Cognition  Overall Cognitive Status: History of cognitive impairments - at baseline       Comprehension  Auditory Comprehension Overall Auditory Comprehension: Appears within functional limits for tasks assessed    Expression Verbal Expression Overall Verbal Expression: Appears within functional limits for tasks assessed   Oral / Motor  Oral Motor/Sensory Function Overall Oral Motor/Sensory Function: Mild impairment Facial ROM: Reduced right Facial Symmetry: Abnormal symmetry right Facial Strength: Reduced right Lingual ROM: Within Functional Limits Lingual Symmetry: Within Functional Limits Lingual Strength: Reduced [place]um: Impaired right;Other (comment) (Bilateral reduced movment R>L) Motor Speech Overall Motor Speech: Impaired Respiration: Impaired Level of Impairment: Conversation Phonation: Hoarse;Breathy;Low vocal intensity Resonance: Hypernasality Articulation: Impaired Level of Impairment: Word Intelligibility: Intelligible   GO                    Curtis Powell, Susie 05/03/2016, 9:52 AM

## 2016-05-03 NOTE — Progress Notes (Addendum)
Clinical Social Worker (CSW) presented bed offers to patient's mother Zigmund Daniel and she chose [facility]. [facility] liaison is aware of accepted bed offer. [employer] authorization is still pending, anticipate auth with be received today. Patient can D/C to [facility] when stable.   [employer] authorization has been received. Auth # [id number], RUB, next review date 05/05/16, recommended 651 therapy minutes per week.   [employer], LCSW [phone]

## 2016-05-03 NOTE — NC FL2 (Signed)
[place] LEVEL OF CARE SCREENING TOOL     IDENTIFICATION  Patient Name: Curtis Powell Birthdate: December 29, 1965 Sex: male Admission Date (Current Location): 05/01/2016  [facility] and [place] Number:  Selena Lesser  ([id number]) Facility and Address:  [facility], [address], [place], [place] [address]      Provider Number: [id number]  Attending Physician Name and Address:  Vaughan Basta, MD  Relative Name and Phone Number:       Current Level of Care: Hospital Recommended Level of Care: [place] Prior Approval Number:    Date Approved/Denied:   PASRR Number:  ([id number])  Discharge Plan: SNF    Current Diagnoses: Patient Active Problem List   Diagnosis Date Noted  . Acute CVA (cerebrovascular accident) ([place]) 05/02/2016  . Left-sided weakness 05/01/2016    Orientation RESPIRATION BLADDER Height & Weight     Self, Time, Situation, Place  Normal Continent Weight: 240 lb (108.9 kg) Height:  5' (152.4 cm)  BEHAVIORAL SYMPTOMS/MOOD NEUROLOGICAL BOWEL NUTRITION STATUS   (None. )  (None. ) Continent Diet (Diet: [facility] )  AMBULATORY STATUS COMMUNICATION OF NEEDS Skin   Extensive Assist Verbally Normal                       Personal Care Assistance Level of Assistance  Bathing, Feeding, Dressing Bathing Assistance: Limited assistance Feeding assistance: Independent Dressing Assistance: Limited assistance     Functional Limitations Info  Sight, Hearing, Speech Sight Info: Adequate Hearing Info: Adequate Speech Info: Adequate    SPECIAL CARE FACTORS FREQUENCY  PT (By licensed PT), OT (By licensed OT)     PT Frequency:  (5) OT Frequency:  (5)            Contractures      Additional Factors Info  Code Status, Allergies Code Status Info:  (Full Code) Allergies Info:  (No Known Allergies)           Current Medications (05/03/2016):  This is the current hospital active medication  list Current Facility-Administered Medications  Medication Dose Route Frequency Provider Last Rate Last Dose  . acetaminophen (TYLENOL) tablet 650 mg  650 mg Oral Q4H PRN Demetrios Loll, MD   650 mg at 05/02/16 [id number]   Or  . acetaminophen (TYLENOL) solution 650 mg  650 mg Per Tube Q4H PRN Demetrios Loll, MD       Or  . acetaminophen (TYLENOL) suppository 650 mg  650 mg Rectal Q4H PRN Demetrios Loll, MD      . aspirin suppository 300 mg  300 mg Rectal Daily Demetrios Loll, MD       Or  . aspirin tablet 325 mg  325 mg Oral Daily Demetrios Loll, MD   325 mg at 05/02/16 1228  . atorvastatin (LIPITOR) tablet 40 mg  40 mg Oral q1800 Demetrios Loll, MD   40 mg at 05/02/16 1834  . enoxaparin (LOVENOX) injection 40 mg  40 mg Subcutaneous Q12H Sheema M Hallaji, RPH   40 mg at 05/02/16 2149  . oxyCODONE-acetaminophen (PERCOCET/ROXICET) 5-325 MG per tablet 1 tablet  1 tablet Oral Q6H PRN Vaughan Basta, MD   1 tablet at 05/02/16 2150  . pantoprazole (PROTONIX) EC tablet 40 mg  40 mg Oral Daily Demetrios Loll, MD   40 mg at 05/02/16 1228  . senna-docusate (Senokot-S) tablet 1 tablet  1 tablet Oral QHS PRN Demetrios Loll, MD         Discharge Medications: Please see  discharge summary for a list of discharge medications.  Relevant Imaging Results:  Relevant Lab Results:   Additional Information  (SSN: [id number])  Sample, Veronia Beets, LCSW

## 2016-05-03 NOTE — Progress Notes (Signed)
Pt for discharge to  [facility].  Alert. No resp distress.  Sl d/cd earlier. Report called to [employer] r[facility].  Ems  Called earlier and  They  Are here now to transport pt. No c/o from pt.

## 2016-05-04 LAB — CARDIOLIPIN ANTIBODIES, IGG, IGM, IGA
Anticardiolipin IgA: <9 APL U/mL (ref 0–11)
Anticardiolipin IgG: <9 GPL U/mL (ref 0–14)
Anticardiolipin IgM: 9 MPL U/mL (ref 0–12)

## 2016-05-06 LAB — FACTOR 5 LEIDEN

## 2017-02-06 ENCOUNTER — Encounter: Payer: Self-pay | Admitting: Occupational Therapy

## 2017-02-06 ENCOUNTER — Ambulatory Visit: Payer: Medicare HMO | Attending: Family Medicine | Admitting: Occupational Therapy

## 2017-02-06 DIAGNOSIS — M6281 Muscle weakness (generalized): Secondary | ICD-10-CM

## 2017-02-06 DIAGNOSIS — I69351 Hemiplegia and hemiparesis following cerebral infarction affecting right dominant side: Secondary | ICD-10-CM | POA: Diagnosis present

## 2017-02-06 DIAGNOSIS — R278 Other lack of coordination: Secondary | ICD-10-CM | POA: Diagnosis present

## 2017-02-06 DIAGNOSIS — R2689 Other abnormalities of gait and mobility: Secondary | ICD-10-CM | POA: Diagnosis present

## 2017-02-06 NOTE — Therapy (Addendum)
[place] MAIN [facility] SERVICES [address] [place], [place], [address] Phone: [phone]   Fax:  [phone]  Occupational Therapy Treatment  Patient Details  Name: Curtis Powell MRN: [id number] Date of Birth: September 30, 1965 Referring Provider: Dr. Bobette Mo  Encounter Date: 02/06/2017      OT End of Session - 02/06/17 1238    Visit Number 1   Number of Visits 24   Date for OT Re-Evaluation 05/01/17   Authorization Type Medicare G Code1 of 10   OT Start Time 1100   OT Stop Time 1205   OT Time Calculation (min) 65 min   Activity Tolerance Patient tolerated treatment well   Behavior During Therapy [facility] for tasks assessed/performed      Past Medical History:  Diagnosis Date  . GERD (gastroesophageal reflux disease)   . Hyperlipidemia   . Hypertension   . Obesity   . Stroke [facility])     Past Surgical History:  Procedure Laterality Date  . BRAIN SURGERY      There were no vitals filed for this visit.      Subjective Assessment - 02/06/17 1102    Subjective  Pt. was accompanied by his mother.    Patient is accompained by: Family member   Pertinent History Pt. is a 51 y.o. male who suffered a CVA on 05/01/2016. Pt. was admitted to the hospital. Once discharged, pt. received Home Health PT and OT services for about a month. Pt. has had multiple CVAs over the past 8 years, and has had multiple falls in the past 6 months. Pt. resides in an apartment. Pt. has caregivers for 80 hours. Pt.'s mother stays with pt. at night, and assists with IADL tasks.     Patient Stated Goals To be able to throw a ball,a nd dribble a ball.   Currently in Pain? Other (Comment)  Pt. has had intermittent pain with shoulder adduction, and extension, and when reaching across his body during activity for the past 6 months. Pt. reports he has not had it checked out.            [facility] OT Assessment - 02/06/17 1108      Assessment   Diagnosis CVA   Referring  Provider Dr. Bobette Mo   Onset Date 05/01/16     Precautions   Precautions Fall     Balance Screen   Has the patient fallen in the past 6 months Yes   How many times? 1   Has the patient had a decrease in activity level because of a fear of falling?  Yes   Is the patient reluctant to leave their home because of a fear of falling?  Yes     Home  Environment   Family/patient expects to be discharged to: Private residence   Living Arrangements Parent   Type of [place] entrance   [place] Bed/Bath upstairs   Alternate Level Stairs - Number of Steps portal ramp at front door   Bathroom Shower/Tub Tub/Shower unit;Curtain   Bathroom Accessibility No   [place] - 2 wheels;Bedside commode;Tub bench  Has an extra wide BSCommode at his bed, Small in bathroom   Additional Comments --  Mother stays with patient     Prior Function   Level of Independence Independent   Vocation On disability   Leisure Watch T.V., Looking out window, Eating.     ADL   Eating/Feeding Moderate assistance   Grooming  Maximal assistance   Upper Body Bathing Moderate assistance   Lower Body Bathing Maximal assistance   Upper Body Dressing Maximal assistance   Lower Body Dressing Maximal assistance   Toilet Transfer Min guard   Where Assessed - Toileting Clothing Manipulationn Maximal assistance   Toileting -  Hygiene Maximal assistance     IADL   Shopping Completely unable to shop   Light Housekeeping All laundry must be done by others;Performs light daily tasks such as dishwashing, bed making;Needs help with all home maintenance tasks  folds clothes   Meal Prep Able to complete simple cold meal and snack prep   Community Mobility Relies on family or friends for transportation   Medication Management Is not capable of dispensing or managing own medication   Financial Management Dependent     Written Expression   Dominant Hand Right   Handwriting 50% legible      Vision - History   Baseline Vision Wears glasses all the time     Cognition   Overall Cognitive Status History of cognitive impairments - at baseline     Sensation   Light Touch Appears Intact   Hot/Cold Appears Intact     Coordination   Right 9 Hole Peg Test 1 min. & 55 sec.   Left 9 Hole Peg Test 53 sec.     AROM   Overall AROM Comments Right shoulder flexion 44(80: Abduction: 70(80)     Hand Function   Right Hand Grip (lbs) 25   Right Hand Lateral Pinch 12 lbs   Right Hand 3 Point Pinch 8 lbs   Left Hand Grip (lbs) 35   Left Hand Lateral Pinch 13 lbs   Left 3 point pinch 12 lbs                          OT Education - 02/06/17 1237    Education provided Yes   Education Details Pt. edcuation was provided about home Instead services, UE functioning, ADLs, and positioning.   Person(s) Educated Patient   Methods Explanation             OT Long Term Goals - 02/06/17 1252      OT LONG TERM GOAL #1   Title Pt. will UE functioning to be able to perfrom toilet hygiene with minimal assistance.   Baseline Pt. is unable   Time 12   Period Weeks   Status New   Target Date 05/01/17     OT LONG TERM GOAL #2   Title Pt. will complete self grooming with minA   Baseline Pt. requires extensive assist   Time 12   Period Weeks   Status New   Target Date 05/01/17     OT LONG TERM GOAL #3   Title Pt. will perfrom self-feeding skills with modified indepndence   Baseline Pt. has difficutly, and performs with increased spillage   Time 12   Period Weeks   Status New   Target Date 05/01/17     OT LONG TERM GOAL #4   Title Pt. will perform self dressing with minA and A/E as needed.   Baseline Pt. requires extensive assist   Time 12   Period Weeks   Status New   Target Date 05/01/17     OT LONG TERM GOAL #5   Title Pt. will perfrom light home making tasks with minA   Baseline Pt. requires extensive assistance   Time 12  Period Weeks   Status  New   Target Date 05/01/17     OT LONG TERM GOAL #6   Title Pt. will write his name efficiently with 100% legibility   Baseline 50% legibility with incresed time required   Time 12   Period Weeks   Status New   Target Date 05/01/17               Plan - 02/06/17 1239    Clinical Impression Statement Pt. is a 51 y.o. male who suffered a CVA on 05/01/2016. Pt. presents with weakness, impaired RUE strength, pain with right shoulder adduction, extension, impaired coordination, limited cognitive functioning, and which hinder his ability to complete basic ADL, and IADLs. Pt. will benefit from skilled OT services to improve UE ROM, strength, and coordination skills to be able to perform ADL, personal hygiene, self-feeding, dressing, and gripping writing, and playing basketball including throwing, and dribbling a ball.   Occupational performance deficits (Please refer to evaluation for details): ADL's   Rehab Potential Fair   OT Frequency 2x / week   OT Duration 12 weeks   OT Treatment/Interventions Self-care/ADL training;Therapeutic exercise;Energy conservation;DME and/or AE instruction;Therapeutic activities;Therapeutic exercises;Patient/family education;Passive range of motion;Cognitive remediation/compensation;Visual/perceptual remediation/compensation;Manual Therapy;Moist Heat   Consulted and Agree with Plan of Care Patient;Family member/caregiver      Patient will benefit from skilled therapeutic intervention in order to improve the following deficits and impairments:  Pain, Decreased cognition, Decreased activity tolerance, Impaired UE functional use, Decreased range of motion, Decreased coordination, Decreased endurance  Visit Diagnosis: Muscle weakness (generalized) - Plan: Ot plan of care cert/re-cert  Other lack of coordination - Plan: Ot plan of care cert/re-cert      G-Codes - 93/79/02 1250    Functional Assessment Tool Used (Outpatient only) Clinical judgement, 9 hole peg  test, dynamometer   Functional Limitation Self care   Self Care Current Status (I0973) At least 40 percent but less than 60 percent impaired, limited or restricted   Self Care Goal Status (Z3299) At least 20 percent but less than 40 percent impaired, limited or restricted      Problem List Patient Active Problem List   Diagnosis Date Noted  . Acute CVA (cerebrovascular accident) ([place]) 05/02/2016  . Left-sided weakness 05/01/2016    Harrel Carina, MS, OTR/L 02/06/2017, 5:11 PM  [place] MAIN [facility] SERVICES [address] [place], [place], [address] Phone: [phone]   Fax:  [phone]  Name: Curtis Powell MRN: [id number] Date of Birth: 1965-09-02

## 2017-02-10 ENCOUNTER — Encounter: Payer: Medicare HMO | Admitting: Occupational Therapy

## 2017-02-10 ENCOUNTER — Ambulatory Visit: Payer: Medicare HMO

## 2017-02-10 DIAGNOSIS — M6281 Muscle weakness (generalized): Secondary | ICD-10-CM

## 2017-02-10 DIAGNOSIS — R2689 Other abnormalities of gait and mobility: Secondary | ICD-10-CM

## 2017-02-10 DIAGNOSIS — I69351 Hemiplegia and hemiparesis following cerebral infarction affecting right dominant side: Secondary | ICD-10-CM

## 2017-02-10 NOTE — Therapy (Signed)
[place] MAIN [facility] SERVICES [address] [place], [place], [address] Phone: [phone]   Fax:  [phone]  Physical Therapy Evaluation  Patient Details  Name: Curtis Powell MRN: [id number] Date of Birth: 1965-09-29 Referring Provider: Dr. Larinda Buttery   Encounter Date: 02/10/2017  PT End of Session - 02/10/17 1739    Visit Number  1    Number of Visits  16    Date for PT Re-Evaluation  04/07/17    Authorization - Visit Number  1    Authorization - Number of Visits  10    PT Start Time  1608    PT Stop Time  1704    PT Time Calculation (min)  56 min    Equipment Utilized During Treatment  Gait belt    Activity Tolerance  Patient tolerated treatment well;Treatment limited secondary to medical complications (Comment);Patient limited by fatigue    Behavior During Therapy  [facility] for tasks assessed/performed       Past Medical History:  Diagnosis Date  . GERD (gastroesophageal reflux disease)   . Hyperlipidemia   . Hypertension   . Obesity   . Stroke [facility])     Past Surgical History:  Procedure Laterality Date  . BRAIN SURGERY      There were no vitals filed for this visit.   Subjective Assessment - 02/10/17 1616    Subjective  Patient is a pleasant 51 year old male who presents to physical therapy for weakness and immobility secondary to CVA.     Pertinent History   Patient is a pleasant 51 year old male who presents to physical therapy for weakness and immobility secondary to CVA.  Had a stroke in Feb 2018. He was previously fully independent, but this stroke caused severe residual deficits, mainly on the right side as well as speech, and now he is unable to walk or perform most of his ADLs on his own. Entire right side is very weak. He still has a little difficulty with speech but his swallowing is improved to baseline. His mom had to move in with him and now is his main caregiver.     Limitations   Sitting;Lifting;Standing;Walking;House hold activities    How long can you sit comfortably?  5 minutes    How long can you stand comfortably?  5 minutes    How long can you walk comfortably?  10 ft    Patient Stated Goals  Walk without walker, walk further with walker, get some strength in back     Currently in Pain?  No/denies         [facility] PT Assessment - 02/10/17 0001      Assessment   Medical Diagnosis  hemiparesis    Referring Provider  Dr. Arrie Aran Santaya    Onset Date/Surgical Date  05/13/16    Hand Dominance  Right    Next MD Visit  december    Prior Therapy  yes, in home       Precautions   Precautions  Fall      Balance Screen   Has the patient fallen in the past 6 months  Yes    How many times?  1    Has the patient had a decrease in activity level because of a fear of falling?   Yes    Is the patient reluctant to leave their home because of a fear of falling?   Yes      Home  Film/video editor residence    Living Arrangements  Parent    Available Help at Discharge  Family;Personal care attendant someone comes in 8-11    someone comes in 8-11    Type of [place] entrance    Entrance Stairs-Number of Steps  1    [place]  One level    [place]annon bars - toilet;Walker - 2 wheels;Shower seat;Wheelchair - manual      Prior Function   Level of Independence  Independent    Vocation  On disability    Leisure  watch TV, go out and be out of the house, go on a car ride       Cognition   Overall Cognitive Status  Impaired/Different from baseline    Area of Impairment  Orientation;Following commands;Safety/judgement      Observation/Other Assessments   Observations  impaired trunk control       Sensation   Light Touch  Appears Intact      Coordination   Gross Motor Movements are Fluid and Coordinated  No    Heel Shin Test  poor control of RLE      Posture/Postural Control    Posture/Postural Control  Postural limitations    Postural Limitations  Rounded Shoulders;Forward head;Flexed trunk      Bed Mobility   Bed Mobility  Rolling Right;Rolling Left;Left Sidelying to Sit;Sit to Sidelying Left;Sitting - Scoot to Edge of Bed    Rolling Right  4: Min assist    Rolling Right Details (indicate cue type and reason)  UE assistance and RLE     Rolling Left  4: Min assist    Rolling Left Details (indicate cue type and reason)  RLE assist    Left Sidelying to Sit  3: Mod assist;HOB flat    Left Sidelying to Sit Details (indicate cue type and reason)  require LE assistance    Sitting - Scoot to Edge of Bed  4: Min guard    Sit to Sidelying Left  3: Mod assist    Sit to Sidelying Left Details (indicate cue type and reason)  total dependence on PT for LE's      Transfers   Transfers  Sit to Stand;Stand to Sit;Stand Pivot Transfers    Sit to Stand  4: Min assist;With upper extremity assist    Sit to Stand Details  Tactile cues for initiation;Tactile cues for sequencing;Tactile cues for placement    Stand to Sit  4: Min assist    Stand to Sit Details (indicate cue type and reason)  Tactile cues for placement;Visual cues/gestures for precautions/safety;Verbal cues for sequencing    Stand Pivot Transfers  4: Min assist      Ambulation/Gait   Ambulation/Gait  Yes    Ambulation/Gait Assistance  4: Min guard    Ambulation Distance (Feet)  4 Feet    Assistive device  Standard walker    Gait Pattern  Decreased stride length;Decreased weight shift to right;Right flexed knee in stance;Ataxic;Shuffle;Trunk flexed    Ambulation Surface  Level;Indoor     Patient walks with walker 10 ft  .      PAIN: 0/10  POSTURE: Excessively flexed trunk, poor ability to retain upright posture for > 5 seconds    STRENGTH:  Graded on a 0-5 scale Muscle Group Left Right  Shoulder flex    Shoulder Abd    Shoulder Ext  Shoulder IR/ER    Elbow    Wrist/hand    Hip Flex 4/5 3+/5   Hip Abd 4/5 3+/5  Hip Add 4/5 3+/5  Hip Ext 4/5 3+/5  Hip IR/ER 4/5 3+/5  Knee Flex 4/5 3/5  Knee Ext 4/5 3/5  Ankle DF 4/5 3+5  Ankle PF 4/5 3+/5   SENSATION: WFL no differences    FUNCTIONAL MOBILITY: Require Mod A for bed mobility CGA for ambulation for short distances  BALANCE: Static Sitting Balance  Normal Able to maintain balance against maximal resistance   Good Able to maintain balance against moderate resistance   Good-/Fair+ Accepts minimal resistance   Fair Able to sit unsupported without balance loss and without UE support x  Poor+ Able to maintain with Minimal assistance from individual or chair   Poor Unable to maintain balance-requires mod/max support from individual or chair    Dynamic Sitting Balance  Normal Able to sit unsupported and weight shift across midline maximally   Good Able to sit unsupported and weight shift across midline moderately   Good-/Fair+ Able to sit unsupported and weight shift across midline minimally   Fair Minimal weight shifting ipsilateral/front, difficulty crossing midline x  Fair- Reach to ipsilateral side and unable to weight shift   Poor + Able to sit unsupported with min A and reach to ipsilateral side, unable to weight shift   Poor Able to sit unsupported with mod A and reach ipsilateral/front-can't cross midline        OUTCOME MEASURES: TEST Outcome Interpretation  5 times sit<>stand Able to perform 1STS with mod A >60 yo, >15 sec indicates increased risk for falls  ABC 5% Low level of physical function  LEFS 14/80                 Treat Marching 10x LAQ 10x Scapular retractions 10x TrA activation 10x3 second holds Ankle pumps    Objective measurements completed on examination: See above findings.              PT Education - 02/10/17 1739    Education provided  Yes    Education Details  Pt. HEP, POC, bed mobility, transfers    Person(s) Educated  Patient;Parent(s)    Methods   Explanation;Demonstration;Verbal cues;Handout    Comprehension  Verbalized understanding;Returned demonstration;Verbal cues required       PT Short Term Goals - 02/10/17 1743      PT SHORT TERM GOAL #1   Title  Patient will be independent in home exercise program to improve strength/mobility for better functional independence with ADLs.    Baseline  hep given    Time  2    Period  Weeks    Status  New    Target Date  02/24/17      PT SHORT TERM GOAL #2   Title  Patient will require min cueing for STS transfer with CGA for increased independence with mobility.     Baseline  Mod cueing for sequencing of hands, and Min A     Time  2    Period  Weeks    Status  New    Target Date  02/24/17      PT SHORT TERM GOAL #3   Title  Patient will maintain upright posture for > 15 seconds to demonstrate strengthened postural control muscualture     Baseline  5 seconds    Time  2    Period  Weeks    Target Date  02/24/17        PT Long Term Goals - 02/10/17 1745      PT LONG TERM GOAL #1   Title  Patient will perform bed mobility with Supervision to increase independent mobility    Baseline  Require Mod A for EOB to sidelie and back     Time  8    Period  Weeks    Status  New    Target Date  04/07/17      PT LONG TERM GOAL #2   Title  Patient (< 81 years old) will complete five times sit to stand test in < 10 seconds indicating an increased LE strength and improved balance.    Baseline  unable to perform >1 STS     Time  8    Period  Weeks    Status  New    Target Date  04/07/17      PT LONG TERM GOAL #3   Title  Patient will ambulate 40 ft with walker with no breaks to allow for increased mobility within home.     Baseline  ambulates <10 ft    Time  8    Period  Weeks    Status  New    Target Date  04/07/17      PT LONG TERM GOAL #4   Title  Patient will increase BLE gross strength to 4+/5 as to improve functional strength for independent gait, increased standing  tolerance and increased ADL ability.    Baseline  3+/5 RLE    Time  8    Period  Weeks    Status  New    Target Date  04/07/17      PT LONG TERM GOAL #5   Title  Patient will increase lower extremity functional scale to >40/80 to demonstrate improved functional mobility and increased tolerance with ADLs.     Baseline  14/80    Time  8    Period  Weeks    Status  New    Target Date  04/07/17             Plan - 02/10/17 1740    Clinical Impression Statement  Patient is a pleasant 51 year old man who presents to physical therapy s/p CVA hemiparesis. Patient has poor trunk control with preferred posture including slumped chest and flexed trunk from waist due to poor postural musculature strength and control. Pt. Unable to perform 5x STS, require Min A -Mod A for single sit to stand. Require Mod A for bed mobility. Ambulate 4 ft with CGA.and shuffle steppage and poor control of RLE. Patient scored 5% on ABC and 14/80 on LEFS. Patient would benefit from skilled physical therapy to improve functional mobility for improved quality of life.     History and Personal Factors relevant to plan of care:  This patient presents with  3, personal factors/ comorbidities , and 4  body elements including body structures and functions, activity limitations and or participation restrictions. Patient's condition is  evolving,    Clinical Presentation  Evolving    Clinical Presentation due to:  plastic stage of CVA, hemiparesis    Clinical Decision Making  High    Rehab Potential  Fair    Clinical Impairments Affecting Rehab Potential  hx of HTN, HLD, CVA, learning disability, Diabetes, brain tumor,     PT Frequency  2x / week    PT Duration  8 weeks    PT Treatment/Interventions  ADLs/Self Care Home Management;Aquatic Therapy;Ultrasound;Moist Heat;Traction;DME Instruction;Gait training;Stair training;Functional mobility training;Therapeutic activities;Therapeutic exercise;Orthotic  Fit/Training;Neuromuscular re-education;Balance training;Patient/family education;Manual techniques;Wheelchair mobility training;Passive range of motion;Energy conservation;Taping;Visual/perceptual remediation/compensation    PT Next Visit Plan  review HEP, ambulate, // bars, posture    PT Home Exercise Plan  see sheet    Consulted and Agree with Plan of Care  Patient;Family member/caregiver    Family Member Consulted  mother       Patient will benefit from skilled therapeutic intervention in order to improve the following deficits and impairments:  Abnormal gait, Decreased activity tolerance, Decreased balance, Decreased knowledge of precautions, Decreased endurance, Decreased coordination, Decreased knowledge of use of DME, Decreased mobility, Decreased range of motion, Difficulty walking, Decreased safety awareness, Decreased strength, Impaired flexibility, Impaired perceived functional ability, Impaired tone, Postural dysfunction, Improper body mechanics, Pain  Visit Diagnosis: Hemiplegia and hemiparesis following cerebral infarction affecting right dominant side (HCC)  Muscle weakness (generalized)  Other abnormalities of gait and mobility  G-Codes - 02-23-17 1750    Functional Assessment Tool Used (Outpatient Only)  LEFS, ABC, MMT, gait, transfers, sensation, clinical judgement    Functional Limitation  Mobility: Walking and moving around    Mobility: Walking and Moving Around Current Status [phone])  At least 60 percent but less than 80 percent impaired, limited or restricted    Mobility: Walking and Moving Around Goal Status [phone])  At least 40 percent but less than 60 percent impaired, limited or restricted        Problem List Patient Active Problem List   Diagnosis Date Noted  . Acute CVA (cerebrovascular accident) ([place]) 05/02/2016  . Left-sided weakness 05/01/2016   Janna Arch, PT, DPT   Janna Arch Feb 23, 2017, 5:52 PM  [place]  MAIN [facility] SERVICES [address] [place], [place], [address] Phone: [phone]   Fax:  [phone]  Name: Curtis Powell MRN: [id number] Date of Birth: [birth year]

## 2017-02-12 ENCOUNTER — Ambulatory Visit: Payer: Medicare HMO | Admitting: Occupational Therapy

## 2017-02-12 ENCOUNTER — Ambulatory Visit: Payer: Medicare HMO

## 2017-02-12 ENCOUNTER — Encounter: Payer: Self-pay | Admitting: Occupational Therapy

## 2017-02-12 DIAGNOSIS — M6281 Muscle weakness (generalized): Secondary | ICD-10-CM | POA: Diagnosis not present

## 2017-02-12 DIAGNOSIS — R278 Other lack of coordination: Secondary | ICD-10-CM

## 2017-02-12 DIAGNOSIS — R2689 Other abnormalities of gait and mobility: Secondary | ICD-10-CM

## 2017-02-12 DIAGNOSIS — I69351 Hemiplegia and hemiparesis following cerebral infarction affecting right dominant side: Secondary | ICD-10-CM

## 2017-02-12 NOTE — Therapy (Signed)
[place] MAIN [facility] SERVICES [address] [place], [place], [address] Phone: [phone]   Fax:  [phone]  Physical Therapy Treatment  Patient Details  Name: Curtis Powell MRN: [id number] Date of Birth: 09/04/1965 Referring Provider: Dr. Larinda Buttery   Encounter Date: 02/12/2017  PT End of Session - 02/12/17 1606    Visit Number  2    Number of Visits  16    Date for PT Re-Evaluation  04/07/17    Authorization - Visit Number  2    Authorization - Number of Visits  10    PT Start Time  4196    PT Stop Time  1515    PT Time Calculation (min)  38 min    Equipment Utilized During Treatment  Gait belt    Activity Tolerance  Patient tolerated treatment well;Treatment limited secondary to medical complications (Comment);Patient limited by fatigue    Behavior During Therapy  [facility] for tasks assessed/performed       Past Medical History:  Diagnosis Date  . GERD (gastroesophageal reflux disease)   . Hyperlipidemia   . Hypertension   . Obesity   . Stroke [facility])     Past Surgical History:  Procedure Laterality Date  . BRAIN SURGERY      There were no vitals filed for this visit.  Subjective Assessment - 02/12/17 1444    Subjective  Patient reports compliance with HEP. Reports no falls since last session.     Pertinent History   Patient is a pleasant 51 year old male who presents to physical therapy for weakness and immobility secondary to CVA.  Had a stroke in Feb 2018. He was previously fully independent, but this stroke caused severe residual deficits, mainly on the right side as well as speech, and now he is unable to walk or perform most of his ADLs on his own. Entire right side is very weak. He still has a little difficulty with speech but his swallowing is improved to baseline. His mom had to move in with him and now is his main caregiver.     Limitations  Sitting;Lifting;Standing;Walking;House hold activities    How long  can you sit comfortably?  5 minutes    How long can you stand comfortably?  5 minutes    How long can you walk comfortably?  10 ft    Patient Stated Goals  Walk without walker, walk further with walker, get some strength in back     Currently in Pain?  No/denies         Seated with feet on board  Marching 10x LAQ 10x Scapular retractions 10x TrA activation 10x3 second holds Ankle pumps 15x  Gluteal squeezes 10x 3-5 second holds  Seated abduction OTB 10x  seated adduction OTB 10x  Hands on knees, hands on head 12x  Stand pivot transfer with Min A and max verbal cueing for sequencing.   sit to stand with RW and CGA. 5x total, cueing for upright posture at end stand. Left hand on table, right hand on walker to stand, to sit, reach left hand back to table.     Pt. response to medical necessity:  Patient will benefit from continued skilled physical therapy to improve functional mobility for improved quality of life.                PT Education - 02/12/17 1451    Education provided  Yes    Education Details  transfer safety  Person(s) Educated  Patient    Methods  Explanation;Demonstration;Verbal cues    Comprehension  Verbalized understanding;Returned demonstration       PT Short Term Goals - 02/10/17 1743      PT SHORT TERM GOAL #1   Title  Patient will be independent in home exercise program to improve strength/mobility for better functional independence with ADLs.    Baseline  hep given    Time  2    Period  Weeks    Status  New    Target Date  02/24/17      PT SHORT TERM GOAL #2   Title  Patient will require min cueing for STS transfer with CGA for increased independence with mobility.     Baseline  Mod cueing for sequencing of hands, and Min A     Time  2    Period  Weeks    Status  New    Target Date  02/24/17      PT SHORT TERM GOAL #3   Title  Patient will maintain upright posture for > 15 seconds to demonstrate strengthened postural control  muscualture     Baseline  5 seconds    Time  2    Period  Weeks    Target Date  02/24/17        PT Long Term Goals - 02/10/17 1745      PT LONG TERM GOAL #1   Title  Patient will perform bed mobility with Supervision to increase independent mobility    Baseline  Require Mod A for EOB to sidelie and back     Time  8    Period  Weeks    Status  New    Target Date  04/07/17      PT LONG TERM GOAL #2   Title  Patient (< 21 years old) will complete five times sit to stand test in < 10 seconds indicating an increased LE strength and improved balance.    Baseline  unable to perform >1 STS     Time  8    Period  Weeks    Status  New    Target Date  04/07/17      PT LONG TERM GOAL #3   Title  Patient will ambulate 40 ft with walker with no breaks to allow for increased mobility within home.     Baseline  ambulates <10 ft    Time  8    Period  Weeks    Status  New    Target Date  04/07/17      PT LONG TERM GOAL #4   Title  Patient will increase BLE gross strength to 4+/5 as to improve functional strength for independent gait, increased standing tolerance and increased ADL ability.    Baseline  3+/5 RLE    Time  8    Period  Weeks    Status  New    Target Date  04/07/17      PT LONG TERM GOAL #5   Title  Patient will increase lower extremity functional scale to >40/80 to demonstrate improved functional mobility and increased tolerance with ADLs.     Baseline  14/80    Time  8    Period  Weeks    Status  New    Target Date  04/07/17            Plan - 02/12/17 1609    Clinical Impression Statement  Patient demonstrated decreased  need for assistance with transfers upon repetition. Max verbal cueing for sequencing of tasks required for patient sequencing. Seated posture required frequent verbal cueing muscular activation due to fatigue of postural muscles. Patient will benefit from continued skilled physical therapy to improve functional mobility for improved quality of  life.     Rehab Potential  Fair    Clinical Impairments Affecting Rehab Potential  hx of HTN, HLD, CVA, learning disability, Diabetes, brain tumor,     PT Frequency  2x / week    PT Duration  8 weeks    PT Treatment/Interventions  ADLs/Self Care Home Management;Aquatic Therapy;Ultrasound;Moist Heat;Traction;DME Instruction;Gait training;Stair training;Functional mobility training;Therapeutic activities;Therapeutic exercise;Orthotic Fit/Training;Neuromuscular re-education;Balance training;Patient/family education;Manual techniques;Wheelchair mobility training;Passive range of motion;Energy conservation;Taping;Visual/perceptual remediation/compensation    PT Next Visit Plan  review HEP, ambulate, // bars, posture    PT Home Exercise Plan  see sheet    Consulted and Agree with Plan of Care  Patient;Family member/caregiver    Family Member Consulted  mother       Patient will benefit from skilled therapeutic intervention in order to improve the following deficits and impairments:  Abnormal gait, Decreased activity tolerance, Decreased balance, Decreased knowledge of precautions, Decreased endurance, Decreased coordination, Decreased knowledge of use of DME, Decreased mobility, Decreased range of motion, Difficulty walking, Decreased safety awareness, Decreased strength, Impaired flexibility, Impaired perceived functional ability, Impaired tone, Postural dysfunction, Improper body mechanics, Pain  Visit Diagnosis: Hemiplegia and hemiparesis following cerebral infarction affecting right dominant side (HCC)  Muscle weakness (generalized)  Other abnormalities of gait and mobility  Other lack of coordination     Problem List Patient Active Problem List   Diagnosis Date Noted  . Acute CVA (cerebrovascular accident) ([place]) 05/02/2016  . Left-sided weakness 05/01/2016   Janna Arch, PT, DPT   Janna Arch 02/12/2017, 4:11 PM  [place] MAIN [facility]  SERVICES [address] [place], [place], [address] Phone: [phone]   Fax:  [phone]  Name: Curtis Powell MRN: [id number] Date of Birth: [birth year]

## 2017-02-12 NOTE — Therapy (Signed)
[place] MAIN [facility] SERVICES [address] [place], [place], [address] Phone: [phone]   Fax:  [phone]  Occupational Therapy Treatment  Patient Details  Name: Curtis Powell MRN: [id number] Date of Birth: Sep 25, 1965 Referring Provider: Dr. Bobette Mo   Encounter Date: 02/12/2017  OT End of Session - 02/12/17 1732    Visit Number  2    Number of Visits  24    Date for OT Re-Evaluation  05/01/17    Authorization Type  Medicare G Code 2 of 10    OT Start Time  9767    OT Stop Time  1440    OT Time Calculation (min)  55 min    Activity Tolerance  Patient tolerated treatment well    Behavior During Therapy  [facility] for tasks assessed/performed       Past Medical History:  Diagnosis Date  . GERD (gastroesophageal reflux disease)   . Hyperlipidemia   . Hypertension   . Obesity   . Stroke [facility])     Past Surgical History:  Procedure Laterality Date  . BRAIN SURGERY      There were no vitals filed for this visit.  Subjective Assessment - 02/12/17 1731    Subjective   Pt. rode the [place] today, and they arrived early.     Patient is accompained by:  Family member    Pertinent History  Pt. is a 51 y.o. male who suffered a CVA on 05/01/2016. Pt. was admitted to the hospital. Once discharged, he received Home Health PT and OT services for about a month. Pt. has had multiple CVAs over the past 8 years, and has had multiple falls in the past 6 months. Pt. resides in an apartment. Pt. has caregivers for 80 hours. Pt.'s mother stays with pt. at night, and assists with IADL tasks.      Currently in Pain?  No/denies      OT TREATMENT    Therapeutic Exercise:  Pt. tolerated shoulder APROM, and AAROM in all joint ranges of the RUE with no pain. Pt. tolerated 92 degrees of abduction. Pt. Performed hand strengthening with pink theraputty. Pt. required cues for proper technique. Pt. worked on gross grip loop, lateral pinch, 3pt. pinch, gross digit  extension, thumb opposition, and lumbical ex.  Pt. required verbal and tactile cues for proper technique.  Selfcare:  Pt. education was provided about general reacher use for retrieving items, pt. was independently able to retrieve items with the reacher. Pt. initiated attempt using the reacher for manipulating shoelaces only for doffing shoes. Pt. requires additional training for reacher use, and A/E use for LE dressing. Pt. performed toileting using the urinal. Pt. was independently able to use the urinal with set-up. Pt. Required assist to empty the contents, and rinse the container.                          OT Education - 02/12/17 1732    Education provided  Yes    Education Details  UE strength    Person(s) Educated  Patient    Methods  Explanation;Demonstration;Verbal cues    Comprehension  Verbalized understanding;Returned demonstration          OT Long Term Goals - 02/06/17 1252      OT LONG TERM GOAL #1   Title  Pt. will UE functioning to be able to perfrom toilet hygiene with minimal assistance.    Baseline  Pt. is unable  Time  12    Period  Weeks    Status  New    Target Date  05/01/17      OT LONG TERM GOAL #2   Title  Pt. will complete self grooming with minA    Baseline  Pt. requires extensive assist    Time  12    Period  Weeks    Status  New    Target Date  05/01/17      OT LONG TERM GOAL #3   Title  Pt. will perfrom self-feeding skills with modified indepndence    Baseline  Pt. has difficutly, and performs with increased spillage    Time  12    Period  Weeks    Status  New    Target Date  05/01/17      OT LONG TERM GOAL #4   Title  Pt. will perform self dressing with minA and A/E as needed.    Baseline  Pt. requires extensive assist    Time  12    Period  Weeks    Status  New    Target Date  05/01/17      OT LONG TERM GOAL #5   Title  Pt. will perfrom light home making tasks with minA    Baseline  Pt. requires extensive  assistance    Time  12    Period  Weeks    Status  New    Target Date  05/01/17      OT LONG TERM GOAL #6   Title  Pt. will write his name efficiently with 100% legibility    Baseline  50% legibility with incresed time required    Time  12    Period  Weeks    Status  New    Target Date  05/01/17            Plan - 02/12/17 1745    Clinical Impression Statement  Pt. conitnues to present with overall weakness. Pt. hand no discomfort with right shoulder ROM today. Pt. was able to tolerate more AROM, and PROm today, and actively used his arm during all activity without complaint. of discomfort. Pt. continues to require work on A/E use during ADLs, and IADLs.    Occupational performance deficits (Please refer to evaluation for details):  Work;ADL's;IADL's    Rehab Potential  Fair    OT Frequency  2x / week    OT Duration  12 weeks    OT Treatment/Interventions  Self-care/ADL training;Therapeutic exercise;Energy conservation;DME and/or AE instruction;Therapeutic activities;Therapeutic exercises;Patient/family education;Passive range of motion;Cognitive remediation/compensation;Visual/perceptual remediation/compensation;Manual Therapy;Moist Heat    Consulted and Agree with Plan of Care  Patient;Family member/caregiver       Patient will benefit from skilled therapeutic intervention in order to improve the following deficits and impairments:  Pain, Decreased cognition, Decreased activity tolerance, Impaired UE functional use, Decreased range of motion, Decreased coordination, Decreased endurance  Visit Diagnosis: Muscle weakness (generalized)    Problem List Patient Active Problem List   Diagnosis Date Noted  . Acute CVA (cerebrovascular accident) ([place]) 05/02/2016  . Left-sided weakness 05/01/2016    Harrel Carina, MS, OTR/L 02/12/2017, 5:50 PM  [name] MAIN [facility] SERVICES [address] [place], [place], [address] Phone:  [phone]   Fax:  [phone]  Name: Curtis Powell MRN: [id number] Date of Birth: 1965/09/23

## 2017-02-19 ENCOUNTER — Encounter: Payer: Self-pay | Admitting: Occupational Therapy

## 2017-02-19 ENCOUNTER — Encounter: Payer: Self-pay | Admitting: Physical Therapy

## 2017-02-19 ENCOUNTER — Ambulatory Visit: Payer: Medicare HMO | Admitting: Occupational Therapy

## 2017-02-19 ENCOUNTER — Ambulatory Visit: Payer: Medicare HMO | Admitting: Physical Therapy

## 2017-02-19 DIAGNOSIS — I69351 Hemiplegia and hemiparesis following cerebral infarction affecting right dominant side: Secondary | ICD-10-CM

## 2017-02-19 DIAGNOSIS — R278 Other lack of coordination: Secondary | ICD-10-CM

## 2017-02-19 DIAGNOSIS — M6281 Muscle weakness (generalized): Secondary | ICD-10-CM

## 2017-02-19 DIAGNOSIS — R2689 Other abnormalities of gait and mobility: Secondary | ICD-10-CM

## 2017-02-19 NOTE — Therapy (Signed)
[place] MAIN [facility] SERVICES [address] [place], [place], [address] Phone: [phone]   Fax:  [phone]  Occupational Therapy Treatment  Patient Details  Name: Curtis Powell MRN: [id number] Date of Birth: 1965-07-15 Referring Provider: Dr. Bobette Mo   Encounter Date: 02/19/2017  OT End of Session - 02/19/17 1141    Visit Number  3    Number of Visits  24    Date for OT Re-Evaluation  05/01/17    Authorization Type  Medicare G Code 3 of 10    OT Start Time  1045    OT Stop Time  1130    OT Time Calculation (min)  45 min    Activity Tolerance  Patient tolerated treatment well    Behavior During Therapy  [facility] for tasks assessed/performed       Past Medical History:  Diagnosis Date  . GERD (gastroesophageal reflux disease)   . Hyperlipidemia   . Hypertension   . Obesity   . Stroke [facility])     Past Surgical History:  Procedure Laterality Date  . BRAIN SURGERY      There were no vitals filed for this visit.  Subjective Assessment - 02/19/17 1140    Subjective   Pt. rode the [employer] [place] today. Pt. reports that his mother is working today.    Patient is accompained by:  Family member    Pertinent History  Pt. is a 51 y.o. male who suffered a CVA on 05/01/2016. Pt. was admitted to the hospital. Once discharged, he received Home Health PT and OT services for about a month. Pt. has had multiple CVAs over the past 8 years, and has had multiple falls in the past 6 months. Pt. resides in an apartment. Pt. has caregivers for 80 hours. Pt.'s mother stays with pt. at night, and assists with IADL tasks.      Patient Stated Goals  To be able to throw a ball,a nd dribble a ball.    Currently in Pain?  Yes    Pain Location  Arm    Pain Orientation  Right    Pain Onset  More than a month ago    Aggravating Factors   Pt. reports pain early this morning, had difficulty finding a position of comfort when sleeping.      OT TREATMENT    Therapeutic  Exercise:  Pt. Worked RUE AAROM for slow gentle stretching. Pt. Tolerated this well. Pt. Performed LUE strengthening with yellow theraband for elbow flexion, and extension. 3# dumbbell ex. for elbow flexion and extension.  Selfcare:  Pt. Worked on LE dressing with a reacher, sockaid, and LH shoehorn. Pt. Requires maxA with A/E use, and increased time to complete. Pt. Was provided with red built-up foam handles for utensils.                           OT Education - 02/19/17 1141    Education provided  Yes    Person(s) Educated  Patient    Methods  Explanation;Demonstration;Verbal cues    Comprehension  Verbalized understanding;Returned demonstration;Verbal cues required;Need further instruction          OT Long Term Goals - 02/06/17 1252      OT LONG TERM GOAL #1   Title  Pt. will UE functioning to be able to perfrom toilet hygiene with minimal assistance.    Baseline  Pt. is unable    Time  12  Period  Weeks    Status  New    Target Date  05/01/17      OT LONG TERM GOAL #2   Title  Pt. will complete self grooming with minA    Baseline  Pt. requires extensive assist    Time  12    Period  Weeks    Status  New    Target Date  05/01/17      OT LONG TERM GOAL #3   Title  Pt. will perfrom self-feeding skills with modified indepndence    Baseline  Pt. has difficutly, and performs with increased spillage    Time  12    Period  Weeks    Status  New    Target Date  05/01/17      OT LONG TERM GOAL #4   Title  Pt. will perform self dressing with minA and A/E as needed.    Baseline  Pt. requires extensive assist    Time  12    Period  Weeks    Status  New    Target Date  05/01/17      OT LONG TERM GOAL #5   Title  Pt. will perfrom light home making tasks with minA    Baseline  Pt. requires extensive assistance    Time  12    Period  Weeks    Status  New    Target Date  05/01/17      OT LONG TERM GOAL #6   Title  Pt. will write his name  efficiently with 100% legibility    Baseline  50% legibility with incresed time required    Time  12    Period  Weeks    Status  New    Target Date  05/01/17            Plan - 02/19/17 1142    Clinical Impression Statement  Pt. reports his RUE is painful today. Pt. reports intermittent 8-9/10. Pt. continues to present with weakness, limited UE strength, impaired [facility] skills, and limited functional mobility which limit ADL, and IADL functioning. Pt. was provided with bulit-up foam handles for utensils. Pt. continues to require work on A/E use for LE ADLs.     Occupational performance deficits (Please refer to evaluation for details):  ADL's;IADL's;Work    Rehab Potential  Fair    OT Frequency  2x / week    OT Duration  12 weeks    OT Treatment/Interventions  Self-care/ADL training;Therapeutic exercise;Energy conservation;DME and/or AE instruction;Therapeutic activities;Therapeutic exercises;Patient/family education;Passive range of motion;Cognitive remediation/compensation;Visual/perceptual remediation/compensation;Manual Therapy;Moist Heat    Consulted and Agree with Plan of Care  Patient;Family member/caregiver       Patient will benefit from skilled therapeutic intervention in order to improve the following deficits and impairments:  Pain, Decreased cognition, Decreased activity tolerance, Impaired UE functional use, Decreased range of motion, Decreased coordination, Decreased endurance  Visit Diagnosis: Muscle weakness (generalized)  Other lack of coordination    Problem List Patient Active Problem List   Diagnosis Date Noted  . Acute CVA (cerebrovascular accident) ([place]) 05/02/2016  . Left-sided weakness 05/01/2016    Harrel Carina, MS, OTR/L 02/19/2017, 11:55 AM  [place] MAIN [facility] SERVICES [address] [place], [place], [address] Phone: [phone]   Fax:  [phone]  Name: CAGE GUPTON MRN: [id number] Date of  Birth: 30-Sep-1965

## 2017-02-19 NOTE — Therapy (Signed)
[place] MAIN [facility] SERVICES [address] [place], [place], [address] Phone: [phone]   Fax:  [phone]  Physical Therapy Treatment  Patient Details  Name: Curtis Powell MRN: [id number] Date of Birth: 1965-11-13 Referring Provider: Dr. Larinda Buttery   Encounter Date: 02/19/2017  PT End of Session - 02/19/17 1002    Visit Number  3    Number of Visits  16    Date for PT Re-Evaluation  04/07/17    Authorization - Visit Number  3    Authorization - Number of Visits  10    PT Start Time  1324    PT Stop Time  1040    PT Time Calculation (min)  45 min    Equipment Utilized During Treatment  Gait belt    Activity Tolerance  Patient tolerated treatment well;Treatment limited secondary to medical complications (Comment);Patient limited by fatigue    Behavior During Therapy  [facility] for tasks assessed/performed       Past Medical History:  Diagnosis Date  . GERD (gastroesophageal reflux disease)   . Hyperlipidemia   . Hypertension   . Obesity   . Stroke [facility])     Past Surgical History:  Procedure Laterality Date  . BRAIN SURGERY      There were no vitals filed for this visit.  Subjective Assessment - 02/19/17 1001    Subjective  Patient reports doing well; denies any pain; denies any new falls; Patient reports adherence to HEP every day;     Pertinent History   Patient is a pleasant 51 year old male who presents to physical therapy for weakness and immobility secondary to CVA.  Had a stroke in Feb 2018. He was previously fully independent, but this stroke caused severe residual deficits, mainly on the right side as well as speech, and now he is unable to walk or perform most of his ADLs on his own. Entire right side is very weak. He still has a little difficulty with speech but his swallowing is improved to baseline. His mom had to move in with him and now is his main caregiver.     Limitations   Sitting;Lifting;Standing;Walking;House hold activities    How long can you sit comfortably?  5 minutes    How long can you stand comfortably?  5 minutes    How long can you walk comfortably?  10 ft    Patient Stated Goals  Walk without walker, walk further with walker, get some strength in back     Currently in Pain?  No/denies          TREATMENT: Warm up on Nustep level 2 x4 min (unbilled);  Prior to exercise:  Patient transferred wheelchair<>nustep with RW stand pivot transfer with min A with good initiation of transfer but then exhibits heavy posterior lean prior to fully standing requiring min A to avoid posterior loss of balance; Requires cues to increase forward lean/weight shift x2 reps;  Transferred stand pivot wheelchair<>mat table with min A requiring cues for forward weight shift and to come into full standing prior to pivot x2 reps;  Patient transitioned sitting to supine and then sidelying requiring min A for LE positioning and cues for foot placement and UE placement for better  Bed mobility;  Patient required min A for sidelying to sitting; he was able to slide legs off mat mod I but required assistance to lift trunk into sitting;   Patient ambulated 10 feet with RW with  CGA to min A For safety with cues to increase hip flexion march during swing for better foot clearance; He ambulates with wide base of support with increased lateral sway/circumduction for foot advancement/clearance; He was able to exhibit some hip/knee flexion during swing phase but is still limited;   Following therapeutic activity:  Sidelying: Red tband clamshells 2x10 with cues to avoid posterior lean but to increase hip abduction strengthening; Patient reports increased fatigue with advanced exercise;  Hooklying: Hip flexion march red tband 2x10 with cues to increase hip flexion ROM for better hip strengthening;   Sitting on mat table: Red tband hamstring curl x15 bilaterally; seated adduction  small ball between legs x15; Diaphragmatic breathing with erect posture in sitting x5 reps with cues for lumbar extension during erect posture for increased back strengthening;     Patient denies any pain at end of session; he does report mild fatigue;                   PT Education - 02/19/17 1002    Education provided  Yes    Education Details  transfer safety, LE strengthening;     Person(s) Educated  Patient    Methods  Explanation;Demonstration;Verbal cues    Comprehension  Verbalized understanding;Returned demonstration;Verbal cues required;Need further instruction       PT Short Term Goals - 02/10/17 1743      PT SHORT TERM GOAL #1   Title  Patient will be independent in home exercise program to improve strength/mobility for better functional independence with ADLs.    Baseline  hep given    Time  2    Period  Weeks    Status  New    Target Date  02/24/17      PT SHORT TERM GOAL #2   Title  Patient will require min cueing for STS transfer with CGA for increased independence with mobility.     Baseline  Mod cueing for sequencing of hands, and Min A     Time  2    Period  Weeks    Status  New    Target Date  02/24/17      PT SHORT TERM GOAL #3   Title  Patient will maintain upright posture for > 15 seconds to demonstrate strengthened postural control muscualture     Baseline  5 seconds    Time  2    Period  Weeks    Target Date  02/24/17        PT Long Term Goals - 02/10/17 1745      PT LONG TERM GOAL #1   Title  Patient will perform bed mobility with Supervision to increase independent mobility    Baseline  Require Mod A for EOB to sidelie and back     Time  8    Period  Weeks    Status  New    Target Date  04/07/17      PT LONG TERM GOAL #2   Title  Patient (< 29 years old) will complete five times sit to stand test in < 10 seconds indicating an increased LE strength and improved balance.    Baseline  unable to perform >1 STS     Time  8     Period  Weeks    Status  New    Target Date  04/07/17      PT LONG TERM GOAL #3   Title  Patient will ambulate 40 ft with walker  with no breaks to allow for increased mobility within home.     Baseline  ambulates <10 ft    Time  8    Period  Weeks    Status  New    Target Date  04/07/17      PT LONG TERM GOAL #4   Title  Patient will increase BLE gross strength to 4+/5 as to improve functional strength for independent gait, increased standing tolerance and increased ADL ability.    Baseline  3+/5 RLE    Time  8    Period  Weeks    Status  New    Target Date  04/07/17      PT LONG TERM GOAL #5   Title  Patient will increase lower extremity functional scale to >40/80 to demonstrate improved functional mobility and increased tolerance with ADLs.     Baseline  14/80    Time  8    Period  Weeks    Status  New    Target Date  04/07/17            Plan - 02/19/17 1048    Clinical Impression Statement  Patient instructed in advanced LE strengthening increasing repetitions and resistance. Patient required cues to increase ROM for better strengthening; He does require min A for bed mobility for better rolling/positioning. He exhibits better preparation for sit<>Stand transfer however prior to coming into full standing exhibits heavy posterior lean; Patient requires cues for forward weight shift and min A for sit<>Stand/pivot transfers. Patient instructed to increase hip flexion march during gait for better foot clearance. Patient would benefit from additional skilled PT intervention to improve strength, balance and mobility;     Rehab Potential  Fair    Clinical Impairments Affecting Rehab Potential  hx of HTN, HLD, CVA, learning disability, Diabetes, brain tumor,     PT Frequency  2x / week    PT Duration  8 weeks    PT Treatment/Interventions  ADLs/Self Care Home Management;Aquatic Therapy;Ultrasound;Moist Heat;Traction;DME Instruction;Gait training;Stair training;Functional  mobility training;Therapeutic activities;Therapeutic exercise;Orthotic Fit/Training;Neuromuscular re-education;Balance training;Patient/family education;Manual techniques;Wheelchair mobility training;Passive range of motion;Energy conservation;Taping;Visual/perceptual remediation/compensation    PT Next Visit Plan  review HEP, ambulate, // bars, posture    PT Home Exercise Plan  see sheet    Consulted and Agree with Plan of Care  Patient;Family member/caregiver    Family Member Consulted  mother       Patient will benefit from skilled therapeutic intervention in order to improve the following deficits and impairments:  Abnormal gait, Decreased activity tolerance, Decreased balance, Decreased knowledge of precautions, Decreased endurance, Decreased coordination, Decreased knowledge of use of DME, Decreased mobility, Decreased range of motion, Difficulty walking, Decreased safety awareness, Decreased strength, Impaired flexibility, Impaired perceived functional ability, Impaired tone, Postural dysfunction, Improper body mechanics, Pain  Visit Diagnosis: Hemiplegia and hemiparesis following cerebral infarction affecting right dominant side (HCC)  Muscle weakness (generalized)  Other abnormalities of gait and mobility     Problem List Patient Active Problem List   Diagnosis Date Noted  . Acute CVA (cerebrovascular accident) ([place]) 05/02/2016  . Left-sided weakness 05/01/2016    Trotter,Margaret PT, DPT 02/19/2017, 10:51 AM  [place] MAIN [facility] SERVICES [address] [place], [place], [address] Phone: [phone]   Fax:  [phone]  Name: ANIKET PAYE MRN: [id number] Date of Birth: 1966/02/28

## 2017-02-24 ENCOUNTER — Encounter: Payer: Medicare HMO | Admitting: Occupational Therapy

## 2017-02-24 ENCOUNTER — Ambulatory Visit: Payer: Medicare HMO

## 2017-02-26 ENCOUNTER — Ambulatory Visit: Payer: Medicare HMO | Admitting: Occupational Therapy

## 2017-02-26 ENCOUNTER — Encounter: Payer: Self-pay | Admitting: Occupational Therapy

## 2017-02-26 ENCOUNTER — Ambulatory Visit: Payer: Medicare HMO | Admitting: Physical Therapy

## 2017-02-26 ENCOUNTER — Encounter: Payer: Self-pay | Admitting: Physical Therapy

## 2017-02-26 DIAGNOSIS — R2689 Other abnormalities of gait and mobility: Secondary | ICD-10-CM

## 2017-02-26 DIAGNOSIS — M6281 Muscle weakness (generalized): Secondary | ICD-10-CM | POA: Diagnosis not present

## 2017-02-26 DIAGNOSIS — R278 Other lack of coordination: Secondary | ICD-10-CM

## 2017-02-26 DIAGNOSIS — I69351 Hemiplegia and hemiparesis following cerebral infarction affecting right dominant side: Secondary | ICD-10-CM

## 2017-02-26 NOTE — Therapy (Signed)
[place] MAIN [facility] SERVICES [address] [place], [place], [address] Phone: [phone]   Fax:  [phone]  Physical Therapy Treatment  Patient Details  Name: Curtis Powell MRN: [id number] Date of Birth: [birth year] Referring Provider: Dr. Larinda Buttery   Encounter Date: 02/26/2017  PT End of Session - 02/26/17 1455    Visit Number  4    Number of Visits  16    Date for PT Re-Evaluation  04/07/17    Authorization - Visit Number  4    Authorization - Number of Visits  10    PT Start Time  5397    PT Stop Time  1515    PT Time Calculation (min)  42 min    Equipment Utilized During Treatment  Gait belt    Activity Tolerance  Patient tolerated treatment well;Treatment limited secondary to medical complications (Comment);Patient limited by fatigue    Behavior During Therapy  [facility] for tasks assessed/performed       Past Medical History:  Diagnosis Date  . GERD (gastroesophageal reflux disease)   . Hyperlipidemia   . Hypertension   . Obesity   . Stroke [facility])     Past Surgical History:  Procedure Laterality Date  . BRAIN SURGERY      There were no vitals filed for this visit.  Subjective Assessment - 02/26/17 1453    Subjective  Patient reports doing well. He reports that he has not been walking much at home because his aide doesn't feel comfortable helping him with walking. Instructed patient to see if his Aide can come to one therapy session so that we can help improve mobility/safety with walking;     Pertinent History   Patient is a pleasant 51 year old male who presents to physical therapy for weakness and immobility secondary to CVA.  Had a stroke in Feb 2018. He was previously fully independent, but this stroke caused severe residual deficits, mainly on the right side as well as speech, and now he is unable to walk or perform most of his ADLs on his own. Entire right side is very weak. He still has a little difficulty  with speech but his swallowing is improved to baseline. His mom had to move in with him and now is his main caregiver.     Limitations  Sitting;Lifting;Standing;Walking;House hold activities    How long can you sit comfortably?  5 minutes    How long can you stand comfortably?  5 minutes    How long can you walk comfortably?  10 ft    Patient Stated Goals  Walk without walker, walk further with walker, get some strength in back     Currently in Pain?  No/denies          TREATMENT: Warm up on Nustep level 2 x4 min (unbilled);  Prior to exercise:  Transferred stand pivot wheelchair<>mat table with min A requiring cues for forward weight shift and to come into full standing prior to pivot x2 reps;  Patient transitioned sitting to supine and then sidelying requiring min A for LE positioning and cues for foot placement and UE placement for better  Bed mobility;  Patient required min A for sidelying to sitting; he was able to slide legs off mat mod I but required assistance to lift trunk into sitting;   Following therapeutic activity: Patient ambulated 20 feet with RW with CGA to min A For safety with cues to increase hip flexion march  during swing for better foot clearance; He ambulates with wide base of support with increased lateral sway/circumduction for foot advancement/clearance; He was able to exhibit some hip/knee flexion during swing phase but is still limited; Patient instructed to increase step length with improving sequencing of walker placement/movement. Patient ambulated additional 30 feet with RW, min A to CGA with this instruction. He was able to exhibit better reciprocal pattern and narrow base of support. He does report increased unsteadiness but gait was more fluid and cadence more equal;   Following gait training:  Sidelying: Red tband clamshells 2x15 with cues to avoid posterior lean but to increase hip abduction strengthening; Patient reports increased fatigue with advanced  exercise;  Sitting on mat table: Red tband hamstring curl x15 bilaterally; Hip flexion red tband 2x10 with visual cues to increase ROM for better strengthening;  seated hip adduction small ball between legs x20;   Patient denies any pain at end of session; he does report mild fatigue;                 PT Education - 02/26/17 1454    Education provided  Yes    Education Details  transfers/bed mobility, LE strengthening;     Person(s) Educated  Patient    Methods  Explanation;Demonstration    Comprehension  Verbalized understanding;Returned demonstration;Verbal cues required;Need further instruction       PT Short Term Goals - 02/10/17 1743      PT SHORT TERM GOAL #1   Title  Patient will be independent in home exercise program to improve strength/mobility for better functional independence with ADLs.    Baseline  hep given    Time  2    Period  Weeks    Status  New    Target Date  02/24/17      PT SHORT TERM GOAL #2   Title  Patient will require min cueing for STS transfer with CGA for increased independence with mobility.     Baseline  Mod cueing for sequencing of hands, and Min A     Time  2    Period  Weeks    Status  New    Target Date  02/24/17      PT SHORT TERM GOAL #3   Title  Patient will maintain upright posture for > 15 seconds to demonstrate strengthened postural control muscualture     Baseline  5 seconds    Time  2    Period  Weeks    Target Date  02/24/17        PT Long Term Goals - 02/10/17 1745      PT LONG TERM GOAL #1   Title  Patient will perform bed mobility with Supervision to increase independent mobility    Baseline  Require Mod A for EOB to sidelie and back     Time  8    Period  Weeks    Status  New    Target Date  04/07/17      PT LONG TERM GOAL #2   Title  Patient (< 91 years old) will complete five times sit to stand test in < 10 seconds indicating an increased LE strength and improved balance.    Baseline  unable to  perform >1 STS     Time  8    Period  Weeks    Status  New    Target Date  04/07/17      PT LONG TERM GOAL #3  Title  Patient will ambulate 40 ft with walker with no breaks to allow for increased mobility within home.     Baseline  ambulates <10 ft    Time  8    Period  Weeks    Status  New    Target Date  04/07/17      PT LONG TERM GOAL #4   Title  Patient will increase BLE gross strength to 4+/5 as to improve functional strength for independent gait, increased standing tolerance and increased ADL ability.    Baseline  3+/5 RLE    Time  8    Period  Weeks    Status  New    Target Date  04/07/17      PT LONG TERM GOAL #5   Title  Patient will increase lower extremity functional scale to >40/80 to demonstrate improved functional mobility and increased tolerance with ADLs.     Baseline  14/80    Time  8    Period  Weeks    Status  New    Target Date  04/07/17            Plan - 02/26/17 1618    Clinical Impression Statement  Patient instructed in advanced LE strengthening exercise. Able to increase repetition for better strengthening. patient does require min VCs for correct positioning and to increase ROM for better strength; Patient instructed in gait training with RW. He required mod VCs for sequencing and improving step length for better reciprocal gait pattern; Patient was able to narrow base of support with improved reciprocal gait pattern but reports increased unsteadiness. He does require min A to CGA for gait safety; He would benefit from additional skilled PT Intervention to improve strength, balance and gait safety; Instructed patient to bring home aide to next session when possible so that home aide can be educated in gait safety so that patient can be walking more at home. Patient agreed;     Rehab Potential  Fair    Clinical Impairments Affecting Rehab Potential  hx of HTN, HLD, CVA, learning disability, Diabetes, brain tumor,     PT Frequency  2x / week    PT  Duration  8 weeks    PT Treatment/Interventions  ADLs/Self Care Home Management;Aquatic Therapy;Ultrasound;Moist Heat;Traction;DME Instruction;Gait training;Stair training;Functional mobility training;Therapeutic activities;Therapeutic exercise;Orthotic Fit/Training;Neuromuscular re-education;Balance training;Patient/family education;Manual techniques;Wheelchair mobility training;Passive range of motion;Energy conservation;Taping;Visual/perceptual remediation/compensation    PT Next Visit Plan  review HEP, ambulate, // bars, posture    PT Home Exercise Plan  see sheet    Consulted and Agree with Plan of Care  Patient;Family member/caregiver    Family Member Consulted  mother       Patient will benefit from skilled therapeutic intervention in order to improve the following deficits and impairments:  Abnormal gait, Decreased activity tolerance, Decreased balance, Decreased knowledge of precautions, Decreased endurance, Decreased coordination, Decreased knowledge of use of DME, Decreased mobility, Decreased range of motion, Difficulty walking, Decreased safety awareness, Decreased strength, Impaired flexibility, Impaired perceived functional ability, Impaired tone, Postural dysfunction, Improper body mechanics, Pain  Visit Diagnosis: Muscle weakness (generalized)  Other lack of coordination  Other abnormalities of gait and mobility     Problem List Patient Active Problem List   Diagnosis Date Noted  . Acute CVA (cerebrovascular accident) ([place]) 05/02/2016  . Left-sided weakness 05/01/2016    Afrika Brick  PT, DPT 02/26/2017, 4:20 PM  [place] MAIN [facility] SERVICES [address] [place], [place], [address]  Phone: [phone]   Fax:  [phone]  Name: Curtis Powell MRN: [id number] Date of Birth: Oct 01, 1965

## 2017-03-01 NOTE — Therapy (Signed)
[place] MAIN [facility] SERVICES [address] [place], [place], [address] Phone: [phone]   Fax:  [phone]  Occupational Therapy Treatment  Patient Details  Name: Curtis Powell MRN: [id number] Date of Birth: 12-11-1965 Referring Provider: Dr. Bobette Mo   Encounter Date: 02/26/2017  OT End of Session - 03/01/17 1412    Visit Number  4    Number of Visits  24    Date for OT Re-Evaluation  05/01/17    Authorization Type  Medicare G Code 4 of 10    OT Start Time  8185    OT Stop Time  1430    OT Time Calculation (min)  45 min    Activity Tolerance  Patient tolerated treatment well    Behavior During Therapy  [facility] for tasks assessed/performed       Past Medical History:  Diagnosis Date  . GERD (gastroesophageal reflux disease)   . Hyperlipidemia   . Hypertension   . Obesity   . Stroke [name]     Past Surgical History:  Procedure Laterality Date  . BRAIN SURGERY      There were no vitals filed for this visit.  Subjective Assessment - 03/01/17 1413    Subjective   Rode the [place] today.  Mom works during the day.  Patient reports he was trying to hang up something on the wall with a push pin and had difficulty.     Pertinent History  Pt. is a 51 y.o. male who suffered a CVA on 05/01/2016. Pt. was admitted to the hospital. Once discharged, he received Home Health PT and OT services for about a month. Pt. has had multiple CVAs over the past 8 years, and has had multiple falls in the past 6 months. Pt. resides in an apartment. Pt. has caregivers for 80 hours. Pt.'s mother stays with pt. at night, and assists with IADL tasks.      Patient Stated Goals  To be able to throw a ball,a nd dribble a ball.    Currently in Pain?  No/denies                   OT Treatments/Exercises (OP) - 03/01/17 1414      Fine Motor Coordination   Other Fine Motor Exercises  Patient performing knotting/unknotting with medium nylon rope for multiple  repetitions with use of bilateral UEs for tasks completion.  Cues to use right hand as the lead for the task.       Neurological Re-education Exercises   Other Exercises 1  Patient seen for sustained Grip strength on 4th setting of 17.9# for 25 repetitions completed 2 sets, cues for technique and hand position on gripper..  Patient seen for manipulation of small push pins to place in moderate resistance board with right hand with cues for prehension patterns.  Assistance to push the pins in all the way.  Patient able to push pins in all the way on lower resistance board.             OT Education - 03/01/17 1411    Education provided  Yes    Education Details  home exercises for [employer]) Educated  Patient    Methods  Explanation;Demonstration;Verbal cues    Comprehension  Verbal cues required;Returned demonstration;Verbalized understanding          OT Long Term Goals - 02/26/17 1352      OT LONG TERM GOAL #1   Title  Pt. will UE functioning to be able to perfrom toilet hygiene with minimal assistance.    Baseline  Pt. is unable    Time  12    Period  Weeks    Status  New      OT LONG TERM GOAL #2   Title  Pt. will complete self grooming with minA    Baseline  Pt. requires extensive assist    Time  12    Period  Weeks    Status  New      OT LONG TERM GOAL #3   Title  Pt. will perfrom self-feeding skills with modified indepndence    Baseline  Pt. has difficutly, and performs with increased spillage    Time  12    Period  Weeks    Status  New      OT LONG TERM GOAL #4   Title  Pt. will perform self dressing with minA and A/E as needed.    Baseline  Pt. requires extensive assist    Time  12    Period  Weeks    Status  New      OT LONG TERM GOAL #5   Title  Pt. will perfrom light home making tasks with minA    Baseline  Pt. requires extensive assistance    Time  12    Period  Weeks    Status  New      OT LONG TERM GOAL #6   Title  Pt. will write his  name efficiently with 100% legibility    Baseline  50% legibility with incresed time required    Time  12    Period  Weeks    Status  New            Plan - 03/01/17 1412    Clinical Impression Statement  Patient denies any pain this date, progressing well with all tasks for strength, ROM and coordination.  He is excited for Thanksgiving with his mom.  Continue to work towards RUE hand skills to perform necessary daily tasks at home.     Rehab Potential  Fair    OT Frequency  2x / week    OT Duration  12 weeks    OT Treatment/Interventions  Self-care/ADL training;Therapeutic exercise;Energy conservation;DME and/or AE instruction;Therapeutic activities;Therapeutic exercises;Patient/family education;Passive range of motion;Cognitive remediation/compensation;Visual/perceptual remediation/compensation;Manual Therapy;Moist Heat    Consulted and Agree with Plan of Care  Patient       Patient will benefit from skilled therapeutic intervention in order to improve the following deficits and impairments:  Pain, Decreased cognition, Decreased activity tolerance, Impaired UE functional use, Decreased range of motion, Decreased coordination, Decreased endurance  Visit Diagnosis: Hemiplegia and hemiparesis following cerebral infarction affecting right dominant side (HCC)  Muscle weakness (generalized)  Other lack of coordination    Problem List Patient Active Problem List   Diagnosis Date Noted  . Acute CVA (cerebrovascular accident) ([place]) 05/02/2016  . Left-sided weakness 05/01/2016   [name] T [name], OTR/L, CLT  [name] 03/01/2017, 2:14 PM  [place] MAIN [facility] SERVICES [address] [place], [place], [address] Phone: [phone]   Fax:  [phone]  Name: Curtis Powell MRN: [id number] Date of Birth: 1965-06-06

## 2017-03-03 ENCOUNTER — Encounter: Payer: Self-pay | Admitting: Occupational Therapy

## 2017-03-03 ENCOUNTER — Ambulatory Visit: Payer: Medicare HMO

## 2017-03-03 ENCOUNTER — Ambulatory Visit: Payer: Medicare HMO | Admitting: Occupational Therapy

## 2017-03-03 DIAGNOSIS — R2689 Other abnormalities of gait and mobility: Secondary | ICD-10-CM

## 2017-03-03 DIAGNOSIS — I69351 Hemiplegia and hemiparesis following cerebral infarction affecting right dominant side: Secondary | ICD-10-CM

## 2017-03-03 DIAGNOSIS — M6281 Muscle weakness (generalized): Secondary | ICD-10-CM

## 2017-03-03 DIAGNOSIS — R278 Other lack of coordination: Secondary | ICD-10-CM

## 2017-03-03 NOTE — Therapy (Signed)
[place] MAIN [facility] SERVICES [address] [place], [place], [address] Phone: [phone]   Fax:  [phone]  Physical Therapy Treatment  Patient Details  Name: Curtis Powell MRN: [id number] Date of Birth: November 22, 1965 Referring Provider: Dr. Larinda Buttery   Encounter Date: 03/03/2017  PT End of Session - 03/03/17 1520    Visit Number  5    Number of Visits  16    Date for PT Re-Evaluation  04/07/17    Authorization - Visit Number  5    Authorization - Number of Visits  10    PT Start Time  6599    PT Stop Time  1600    PT Time Calculation (min)  45 min    Equipment Utilized During Treatment  Gait belt    Activity Tolerance  Patient tolerated treatment well;Treatment limited secondary to medical complications (Comment);Patient limited by fatigue    Behavior During Therapy  [facility] for tasks assessed/performed       Past Medical History:  Diagnosis Date  . GERD (gastroesophageal reflux disease)   . Hyperlipidemia   . Hypertension   . Obesity   . Stroke [facility])     Past Surgical History:  Procedure Laterality Date  . BRAIN SURGERY      There were no vitals filed for this visit.  Subjective Assessment - 03/03/17 1519    Subjective  Patient walked back and forth to the kitchen during the weekend. Patient reports caregiver is going to help him do it more often.     Pertinent History   Patient is a pleasant 51 year old male who presents to physical therapy for weakness and immobility secondary to CVA.  Had a stroke in Feb 2018. He was previously fully independent, but this stroke caused severe residual deficits, mainly on the right side as well as speech, and now he is unable to walk or perform most of his ADLs on his own. Entire right side is very weak. He still has a little difficulty with speech but his swallowing is improved to baseline. His mom had to move in with him and now is his main caregiver.     Limitations   Sitting;Lifting;Standing;Walking;House hold activities    How long can you sit comfortably?  5 minutes    How long can you stand comfortably?  5 minutes    How long can you walk comfortably?  10 ft    Patient Stated Goals  Walk without walker, walk further with walker, get some strength in back     Currently in Pain?  No/denies       TREATMENT: Warm up on Nustep level 2 x4 min (unbilled);   WC to Nustep SPT transfer with RW and Min A. Good hand placement noted and initation of movement, however patient had heavy posterior lean requiring Min A to walker to avoid tilt. X 2 reps   WC to mat with SPT transfer and Min A for forward weight shift due to posterior tendency for trunk lean. X 2 reps.   Patient transitioned sitting to supine with Min-Mod A support provided to LE's for placement. Mod A required at trunk for return to seated position.    Patient ambulated 15 feet with RW with CGA to min A at [employer] for turning. Pt. Able to turn to sit with min guiding from PT. Require verbal cueing for upright posture and hip and knee flexion for advancement of limb and forward progression.  Following therapeutic activity:     Hooklying: Hip flexion march red tband 2x10 with cues to increase hip flexion ROM for better hip strengthening;   Hip abduction bright green TB (RTB) 2x 15 to increase hip strength and function.   Hip adduction ball squeezes 2x 15 to increase leg stabilizer musculature.   SAQ 10x each leg with 3 second holds at end range      Sitting on mat table:  Seated postural cueing in front of mirror. Tactile cueing between shoulder blades for scapular retraction, verbal cueing for abdominal contraction and breathing.   LAQ 10x each leg, cues for weight shift.                        PT Education - 03/03/17 1520    Education provided  Yes    Education Details  Economist for functional strengthening and body mechanics    Person(s) Educated  Patient     Methods  Explanation;Demonstration;Verbal cues    Comprehension  Verbalized understanding;Returned demonstration       PT Short Term Goals - 02/10/17 1743      PT SHORT TERM GOAL #1   Title  Patient will be independent in home exercise program to improve strength/mobility for better functional independence with ADLs.    Baseline  hep given    Time  2    Period  Weeks    Status  New    Target Date  02/24/17      PT SHORT TERM GOAL #2   Title  Patient will require min cueing for STS transfer with CGA for increased independence with mobility.     Baseline  Mod cueing for sequencing of hands, and Min A     Time  2    Period  Weeks    Status  New    Target Date  02/24/17      PT SHORT TERM GOAL #3   Title  Patient will maintain upright posture for > 15 seconds to demonstrate strengthened postural control muscualture     Baseline  5 seconds    Time  2    Period  Weeks    Target Date  02/24/17        PT Long Term Goals - 02/10/17 1745      PT LONG TERM GOAL #1   Title  Patient will perform bed mobility with Supervision to increase independent mobility    Baseline  Require Mod A for EOB to sidelie and back     Time  8    Period  Weeks    Status  New    Target Date  04/07/17      PT LONG TERM GOAL #2   Title  Patient (< 47 years old) will complete five times sit to stand test in < 10 seconds indicating an increased LE strength and improved balance.    Baseline  unable to perform >1 STS     Time  8    Period  Weeks    Status  New    Target Date  04/07/17      PT LONG TERM GOAL #3   Title  Patient will ambulate 40 ft with walker with no breaks to allow for increased mobility within home.     Baseline  ambulates <10 ft    Time  8    Period  Weeks    Status  New    Target Date  04/07/17      PT LONG TERM GOAL #4   Title  Patient will increase BLE gross strength to 4+/5 as to improve functional strength for independent gait, increased standing tolerance and increased ADL  ability.    Baseline  3+/5 RLE    Time  8    Period  Weeks    Status  New    Target Date  04/07/17      PT LONG TERM GOAL #5   Title  Patient will increase lower extremity functional scale to >40/80 to demonstrate improved functional mobility and increased tolerance with ADLs.     Baseline  14/80    Time  8    Period  Weeks    Status  New    Target Date  04/07/17            Plan - 03/03/17 1603    Clinical Impression Statement  Patient performed ambulation with slight turn to sit in [facility] requiring Min A for guidance of RW for turn and CGA for straight ambulation. Patient required verbal cueing for upright posture and sequencing of flexion and lifting of LE's. Seated posture emphasized with use of mirror for visual stimuli of upright positioning. Patient will continue to benefit form skilled physical therapy to improve strength, balance, and gait safety.     Rehab Potential  Fair    Clinical Impairments Affecting Rehab Potential  hx of HTN, HLD, CVA, learning disability, Diabetes, brain tumor,     PT Frequency  2x / week    PT Duration  8 weeks    PT Treatment/Interventions  ADLs/Self Care Home Management;Aquatic Therapy;Ultrasound;Moist Heat;Traction;DME Instruction;Gait training;Stair training;Functional mobility training;Therapeutic activities;Therapeutic exercise;Orthotic Fit/Training;Neuromuscular re-education;Balance training;Patient/family education;Manual techniques;Wheelchair mobility training;Passive range of motion;Energy conservation;Taping;Visual/perceptual remediation/compensation    PT Next Visit Plan  review HEP, ambulate, // bars, posture    PT Home Exercise Plan  see sheet    Consulted and Agree with Plan of Care  Patient;Family member/caregiver    Family Member Consulted  mother       Patient will benefit from skilled therapeutic intervention in order to improve the following deficits and impairments:  Abnormal gait, Decreased activity tolerance, Decreased balance,  Decreased knowledge of precautions, Decreased endurance, Decreased coordination, Decreased knowledge of use of DME, Decreased mobility, Decreased range of motion, Difficulty walking, Decreased safety awareness, Decreased strength, Impaired flexibility, Impaired perceived functional ability, Impaired tone, Postural dysfunction, Improper body mechanics, Pain  Visit Diagnosis: Muscle weakness (generalized)  Other lack of coordination  Other abnormalities of gait and mobility  Hemiplegia and hemiparesis following cerebral infarction affecting right dominant side [facility])     Problem List Patient Active Problem List   Diagnosis Date Noted  . Acute CVA (cerebrovascular accident) ([place]) 05/02/2016  . Left-sided weakness 05/01/2016   Janna Arch, PT, DPT   Janna Arch 03/03/2017, 4:05 PM  [place] MAIN [facility] SERVICES [address] [place], [place], [address] Phone: [phone]   Fax:  [phone]  Name: Curtis Powell MRN: [id number] Date of Birth: 19-Sep-1965

## 2017-03-03 NOTE — Therapy (Signed)
[place] MAIN [facility] SERVICES [address] [place], [place], [address] Phone: [phone]   Fax:  [phone]  Occupational Therapy Treatment  Patient Details  Name: Curtis Powell MRN: [id number] Date of Birth: [birth year] Referring Provider: Dr. Bobette Mo   Encounter Date: 03/03/2017  OT End of Session - 03/03/17 1438    Visit Number  5    Number of Visits  24    Date for OT Re-Evaluation  05/01/17    Authorization Type  Medicare G Code 5 of 10    OT Start Time  1430    OT Stop Time  1515    OT Time Calculation (min)  45 min    Activity Tolerance  Patient tolerated treatment well    Behavior During Therapy  [facility] for tasks assessed/performed       Past Medical History:  Diagnosis Date  . GERD (gastroesophageal reflux disease)   . Hyperlipidemia   . Hypertension   . Obesity   . Stroke [facility])     Past Surgical History:  Procedure Laterality Date  . BRAIN SURGERY      There were no vitals filed for this visit.  Subjective Assessment - 03/03/17 1435    Subjective   Pt. reports occassional having Right shoulder pain on and off, when he goes to bed. Pt. reports he thinks he may have arthritis.    Patient is accompained by:  Family member    Pertinent History  Pt. is a 51 y.o. male who suffered a CVA on 05/01/2016. Pt. was admitted to the hospital. Once discharged, he received Home Health PT and OT services for about a month. Pt. has had multiple CVAs over the past 8 years, and has had multiple falls in the past 6 months. Pt. resides in an apartment. Pt. has caregivers for 80 hours. Pt.'s mother stays with pt. at night, and assists with IADL tasks.      Currently in Pain?  No/denies    Pain Orientation  Right    Pain Onset  More than a month ago    Multiple Pain Sites  No        OT TREATMENT    Neuro muscular re-education:  Pt. worked on grasping 1/4" pegs with the right hand, and placing them on a pegboard set at a vertical  angle. Pt. worked on grasping the pegs, storing the pegs, and translatory movements of the hand, and palm. Pt. Also worked on sliding the objects off the edge of the table to the thumb.                    OT Education - 03/03/17 1438    Education provided  Yes    Person(s) Educated  Patient    Methods  Explanation;Demonstration;Verbal cues    Comprehension  Verbalized understanding;Returned demonstration;Verbal cues required       OT Short Term Goals - 03/03/17 1459      OT SHORT TERM GOAL #1   Title  `        OT Long Term Goals - 02/26/17 1352      OT LONG TERM GOAL #1   Title  Pt. will UE functioning to be able to perfrom toilet hygiene with minimal assistance.    Baseline  Pt. is unable    Time  12    Period  Weeks    Status  New      OT LONG TERM GOAL #2  Title  Pt. will complete self grooming with minA    Baseline  Pt. requires extensive assist    Time  12    Period  Weeks    Status  New      OT LONG TERM GOAL #3   Title  Pt. will perfrom self-feeding skills with modified indepndence    Baseline  Pt. has difficutly, and performs with increased spillage    Time  12    Period  Weeks    Status  New      OT LONG TERM GOAL #4   Title  Pt. will perform self dressing with minA and A/E as needed.    Baseline  Pt. requires extensive assist    Time  12    Period  Weeks    Status  New      OT LONG TERM GOAL #5   Title  Pt. will perfrom light home making tasks with minA    Baseline  Pt. requires extensive assistance    Time  12    Period  Weeks    Status  New      OT LONG TERM GOAL #6   Title  Pt. will write his name efficiently with 100% legibility    Baseline  50% legibility with incresed time required    Time  12    Period  Weeks    Status  New            Plan - 03/03/17 1438    Clinical Impression Statement  Pt. reports he got a new track phone this past weekend. Pt. reports he is rasing his right arm better to reach out for  objects. Pt. presents with limited UE strength, and coordination skills. Pt. continues to work on improving UE strength, and coordination skills in order to be able to improve engagement in ADLs, and IADLs.    Occupational performance deficits (Please refer to evaluation for details):  ADL's;IADL's;Work    Rehab Potential  Fair    OT Frequency  2x / week    OT Treatment/Interventions  Self-care/ADL training;Therapeutic exercise;Energy conservation;DME and/or AE instruction;Therapeutic activities;Therapeutic exercises;Patient/family education;Passive range of motion;Cognitive remediation/compensation;Visual/perceptual remediation/compensation;Manual Therapy;Moist Heat    Consulted and Agree with Plan of Care  Patient       Patient will benefit from skilled therapeutic intervention in order to improve the following deficits and impairments:  Pain, Decreased cognition, Decreased activity tolerance, Impaired UE functional use, Decreased range of motion, Decreased coordination, Decreased endurance  Visit Diagnosis: Muscle weakness (generalized)    Problem List Patient Active Problem List   Diagnosis Date Noted  . Acute CVA (cerebrovascular accident) ([place]) 05/02/2016  . Left-sided weakness 05/01/2016    Harrel Carina, MS, OTR/L 03/03/2017, 3:03 PM  [place] MAIN [facility] SERVICES [address] [place], [place], [address] Phone: [phone]   Fax:  [phone]  Name: Curtis Powell MRN: [id number] Date of Birth: 07-Nov-1965

## 2017-03-05 ENCOUNTER — Encounter: Payer: Self-pay | Admitting: Occupational Therapy

## 2017-03-05 ENCOUNTER — Ambulatory Visit: Payer: Medicare HMO

## 2017-03-05 ENCOUNTER — Ambulatory Visit: Payer: Medicare HMO | Admitting: Occupational Therapy

## 2017-03-05 DIAGNOSIS — M6281 Muscle weakness (generalized): Secondary | ICD-10-CM

## 2017-03-05 DIAGNOSIS — R278 Other lack of coordination: Secondary | ICD-10-CM

## 2017-03-05 DIAGNOSIS — R2689 Other abnormalities of gait and mobility: Secondary | ICD-10-CM

## 2017-03-05 DIAGNOSIS — I69351 Hemiplegia and hemiparesis following cerebral infarction affecting right dominant side: Secondary | ICD-10-CM

## 2017-03-05 NOTE — Therapy (Signed)
[place] MAIN [facility] SERVICES [address] [place], [place], [address] Phone: [phone]   Fax:  [phone]  Occupational Therapy Treatment  Patient Details  Name: Curtis Powell MRN: [id number] Date of Birth: 01-18-1966 Referring Provider: Dr. Bobette Mo   Encounter Date: 03/05/2017  OT End of Session - 03/05/17 1442    Visit Number  6    Number of Visits  24    Date for OT Re-Evaluation  05/01/17    Authorization Type  Medicare G Code 6 of 10    OT Start Time  1435    OT Stop Time  1515    OT Time Calculation (min)  40 min    Activity Tolerance  Patient tolerated treatment well    Behavior During Therapy  [facility] for tasks assessed/performed       Past Medical History:  Diagnosis Date  . GERD (gastroesophageal reflux disease)   . Hyperlipidemia   . Hypertension   . Obesity   . Stroke [facility])     Past Surgical History:  Procedure Laterality Date  . BRAIN SURGERY      There were no vitals filed for this visit.  Subjective Assessment - 03/05/17 1437    Subjective   Pt. reports he is doing well.    Patient is accompained by:  Family member    Pertinent History  Pt. is a 51 y.o. male who suffered a CVA on 05/01/2016. Pt. was admitted to the hospital. Once discharged, he received Home Health PT and OT services for about a month. Pt. has had multiple CVAs over the past 8 years, and has had multiple falls in the past 6 months. Pt. resides in an apartment. Pt. has caregivers for 80 hours. Pt.'s mother stays with pt. at night, and assists with IADL tasks.      Currently in Pain?  Yes    Pain Score  6     Pain Location  Arm    Pain Orientation  Right    Pain Descriptors / Indicators  Jabbing;Cramping;Throbbing    Pain Onset  More than a month ago    Pain Frequency  -- Started last night, pain when moving . Feels better after gentle movement.        OT TREATMENT    Neuro muscular re-education:  Pt. worked on grasping 1/4" circular  top pegs, and placing them on the pegboard. Pt. worked on removing the pegs one at a time, and storing them in in his hand. Pt. Dropped multiple pegs when attempting the pegs in the palm of his hand.   Therapeutic Exercise:  Pt. Tolerated PROM/AAROM in the right UE in all joint ranges. Pt. Tolerated the ROM without pain, and without difficulty.                       OT Education - 03/05/17 1442    Education provided  Yes    Education Details  UE strength, [facility]    Person(s) Educated  Patient    Methods  Explanation;Demonstration;Verbal cues    Comprehension  Verbalized understanding;Returned demonstration       OT Short Term Goals - 03/03/17 1459      OT SHORT TERM GOAL #1   Title  `        OT Long Term Goals - 02/26/17 1352      OT LONG TERM GOAL #1   Title  Pt. will UE functioning to be able to  perfrom toilet hygiene with minimal assistance.    Baseline  Pt. is unable    Time  12    Period  Weeks    Status  New      OT LONG TERM GOAL #2   Title  Pt. will complete self grooming with minA    Baseline  Pt. requires extensive assist    Time  12    Period  Weeks    Status  New      OT LONG TERM GOAL #3   Title  Pt. will perfrom self-feeding skills with modified indepndence    Baseline  Pt. has difficutly, and performs with increased spillage    Time  12    Period  Weeks    Status  New      OT LONG TERM GOAL #4   Title  Pt. will perform self dressing with minA and A/E as needed.    Baseline  Pt. requires extensive assist    Time  12    Period  Weeks    Status  New      OT LONG TERM GOAL #5   Title  Pt. will perfrom light home making tasks with minA    Baseline  Pt. requires extensive assistance    Time  12    Period  Weeks    Status  New      OT LONG TERM GOAL #6   Title  Pt. will write his name efficiently with 100% legibility    Baseline  50% legibility with incresed time required    Time  12    Period  Weeks    Status  New             Plan - 03/05/17 1443    Clinical Impression Statement  Pt. reports he has been having pain in the right UE since last night. Pt. reports that his RUE feels better. Pt. tolerated AAROM/PROM without pain to the RUE. Pt. continues to present with limited UE strength, and coordination skills. Pt. continues to work on improving UE functioning for use during ADLs, and IADLs.     Occupational performance deficits (Please refer to evaluation for details):  ADL's;IADL's;Work    Rehab Potential  Fair    OT Frequency  2x / week    OT Duration  12 weeks    OT Treatment/Interventions  Self-care/ADL training;Therapeutic exercise;Energy conservation;DME and/or AE instruction;Therapeutic activities;Therapeutic exercises;Patient/family education;Passive range of motion;Cognitive remediation/compensation;Visual/perceptual remediation/compensation;Manual Therapy;Moist Heat    Consulted and Agree with Plan of Care  Patient       Patient will benefit from skilled therapeutic intervention in order to improve the following deficits and impairments:  Pain, Decreased cognition, Decreased activity tolerance, Impaired UE functional use, Decreased range of motion, Decreased coordination, Decreased endurance  Visit Diagnosis: Muscle weakness (generalized)  Other lack of coordination    Problem List Patient Active Problem List   Diagnosis Date Noted  . Acute CVA (cerebrovascular accident) ([place]) 05/02/2016  . Left-sided weakness 05/01/2016    Harrel Carina, MS, OTR/L 03/05/2017, 3:29 PM  [place] MAIN [facility] SERVICES [address] [place], [place], [address] Phone: [phone]   Fax:  [phone]  Name: Curtis Powell MRN: [id number] Date of Birth: Aug 28, 1965

## 2017-03-05 NOTE — Therapy (Signed)
[place] MAIN [facility] SERVICES [address] [place], [place], [address] Phone: [phone]   Fax:  [phone]  Physical Therapy Treatment  Patient Details  Name: Curtis Powell MRN: [id number] Date of Birth: [birth year] Referring Provider: Dr. Larinda Buttery   Encounter Date: 03/05/2017  PT End of Session - 03/05/17 1517    Visit Number  6    Number of Visits  16    Date for PT Re-Evaluation  04/07/17    Authorization - Visit Number  6    Authorization - Number of Visits  10    PT Start Time  5638    PT Stop Time  1600    PT Time Calculation (min)  42 min    Equipment Utilized During Treatment  Gait belt    Activity Tolerance  Patient tolerated treatment well;Treatment limited secondary to medical complications (Comment);Patient limited by fatigue    Behavior During Therapy  [facility] for tasks assessed/performed       Past Medical History:  Diagnosis Date  . GERD (gastroesophageal reflux disease)   . Hyperlipidemia   . Hypertension   . Obesity   . Stroke [facility])     Past Surgical History:  Procedure Laterality Date  . BRAIN SURGERY      There were no vitals filed for this visit.  Subjective Assessment - 03/05/17 1523    Subjective  Patient reports not walking much since last sesion. Pt. reports that caregiver during the week days won't walk with him.     Pertinent History   Patient is a pleasant 51 year old male who presents to physical therapy for weakness and immobility secondary to CVA.  Had a stroke in Feb 2018. He was previously fully independent, but this stroke caused severe residual deficits, mainly on the right side as well as speech, and now he is unable to walk or perform most of his ADLs on his own. Entire right side is very weak. He still has a little difficulty with speech but his swallowing is improved to baseline. His mom had to move in with him and now is his main caregiver.     Limitations   Sitting;Lifting;Standing;Walking;House hold activities    How long can you sit comfortably?  5 minutes    How long can you stand comfortably?  5 minutes    How long can you walk comfortably?  10 ft    Patient Stated Goals  Walk without walker, walk further with walker, get some strength in back     Currently in Pain?  No/denies         TREATMENT: Warm up on Nustep level 4 x3 min (unbilled);    WC to Nustep SPT transfer with RW and Min A. Good hand placement noted and initation of movement, however patient had heavy posterior lean requiring Min A to walker to avoid tilt. X 2 reps    WC to mat with SPT transfer and Min A for forward weight shift due to posterior tendency for trunk lean. X 2 reps.   Patient transitioned sitting to supine with Min-Mod A support provided to LE's for placement. Mod A required at trunk for return to seated position.    Patient ambulated 15 feet with RW with CGA to min A at [facility] for turning. Pt. Able to turn to sit with min guiding from PT. Require verbal cueing for upright posture and hip and knee flexion for advancement of limb and forward progression.  Scooting to head of mat with LE's, required assistance placing feet for positioning of increased push.   Following therapeutic activity:      Hooklying: Hip flexion march red tband 1x15 with cues to increase hip flexion ROM for better hip strengthening;    Hip abduction bright green TB (RTB) 2x 15 to increase hip strength and function.    Hip adduction ball squeezes 2x 15 to increase leg stabilizer musculature. Second set terminated at 10 due to pain/fatigue of hip.    SAQ 2x 10x each leg with 3 second holds at end range        Sitting on mat table:   Seated postural cueing in front of mirror. Tactile cueing between shoulder blades for scapular retraction, verbal cueing for abdominal contraction and breathing.    LAQ 10x each leg, cues for weight shift.        PT Education - 03/05/17 1516     Education provided  Yes    Education Details  functional strength and body mechanics, upright posture    Person(s) Educated  Patient    Methods  Explanation;Demonstration;Verbal cues    Comprehension  Verbalized understanding;Returned demonstration       PT Short Term Goals - 02/10/17 1743      PT SHORT TERM GOAL #1   Title  Patient will be independent in home exercise program to improve strength/mobility for better functional independence with ADLs.    Baseline  hep given    Time  2    Period  Weeks    Status  New    Target Date  02/24/17      PT SHORT TERM GOAL #2   Title  Patient will require min cueing for STS transfer with CGA for increased independence with mobility.     Baseline  Mod cueing for sequencing of hands, and Min A     Time  2    Period  Weeks    Status  New    Target Date  02/24/17      PT SHORT TERM GOAL #3   Title  Patient will maintain upright posture for > 15 seconds to demonstrate strengthened postural control muscualture     Baseline  5 seconds    Time  2    Period  Weeks    Target Date  02/24/17        PT Long Term Goals - 02/10/17 1745      PT LONG TERM GOAL #1   Title  Patient will perform bed mobility with Supervision to increase independent mobility    Baseline  Require Mod A for EOB to sidelie and back     Time  8    Period  Weeks    Status  New    Target Date  04/07/17      PT LONG TERM GOAL #2   Title  Patient (< 54 years old) will complete five times sit to stand test in < 10 seconds indicating an increased LE strength and improved balance.    Baseline  unable to perform >1 STS     Time  8    Period  Weeks    Status  New    Target Date  04/07/17      PT LONG TERM GOAL #3   Title  Patient will ambulate 40 ft with walker with no breaks to allow for increased mobility within home.     Baseline  ambulates <10 ft  Time  8    Period  Weeks    Status  New    Target Date  04/07/17      PT LONG TERM GOAL #4   Title  Patient  will increase BLE gross strength to 4+/5 as to improve functional strength for independent gait, increased standing tolerance and increased ADL ability.    Baseline  3+/5 RLE    Time  8    Period  Weeks    Status  New    Target Date  04/07/17      PT LONG TERM GOAL #5   Title  Patient will increase lower extremity functional scale to >40/80 to demonstrate improved functional mobility and increased tolerance with ADLs.     Baseline  14/80    Time  8    Period  Weeks    Status  New    Target Date  04/07/17            Plan - 03/05/17 1539    Clinical Impression Statement  Patient's transitions improving with decreased verbal cueing required and decreased instances of posterior LOB.  Connection between where to place LE and how to place it is still challenging to patient frequently requiring either assistance of tactile cueing for positioning. Patient will continue to benefit from skilled physical therapy to improve strength, balance, and gait safety.     Rehab Potential  Fair    Clinical Impairments Affecting Rehab Potential  hx of HTN, HLD, CVA, learning disability, Diabetes, brain tumor,     PT Frequency  2x / week    PT Duration  8 weeks    PT Treatment/Interventions  ADLs/Self Care Home Management;Aquatic Therapy;Ultrasound;Moist Heat;Traction;DME Instruction;Gait training;Stair training;Functional mobility training;Therapeutic activities;Therapeutic exercise;Orthotic Fit/Training;Neuromuscular re-education;Balance training;Patient/family education;Manual techniques;Wheelchair mobility training;Passive range of motion;Energy conservation;Taping;Visual/perceptual remediation/compensation    PT Next Visit Plan  review HEP, ambulate, // bars, posture    PT Home Exercise Plan  see sheet    Consulted and Agree with Plan of Care  Patient;Family member/caregiver    Family Member Consulted  mother       Patient will benefit from skilled therapeutic intervention in order to improve the  following deficits and impairments:  Abnormal gait, Decreased activity tolerance, Decreased balance, Decreased knowledge of precautions, Decreased endurance, Decreased coordination, Decreased knowledge of use of DME, Decreased mobility, Decreased range of motion, Difficulty walking, Decreased safety awareness, Decreased strength, Impaired flexibility, Impaired perceived functional ability, Impaired tone, Postural dysfunction, Improper body mechanics, Pain  Visit Diagnosis: Muscle weakness (generalized)  Other lack of coordination  Other abnormalities of gait and mobility  Hemiplegia and hemiparesis following cerebral infarction affecting right dominant side [facility])     Problem List Patient Active Problem List   Diagnosis Date Noted  . Acute CVA (cerebrovascular accident) ([place]) 05/02/2016  . Left-sided weakness 05/01/2016   Janna Arch, PT, DPT   Janna Arch 03/05/2017, 3:59 PM  [place] MAIN [facility] SERVICES [address] [place], [place], [address] Phone: [phone]   Fax:  [phone]  Name: RITIK STAVOLA MRN: [id number] Date of Birth: Jul 11, 1965

## 2017-03-11 ENCOUNTER — Encounter: Payer: Medicare HMO | Admitting: Occupational Therapy

## 2017-03-11 ENCOUNTER — Ambulatory Visit: Payer: Medicare HMO

## 2017-03-13 ENCOUNTER — Encounter: Payer: Self-pay | Admitting: Occupational Therapy

## 2017-03-13 ENCOUNTER — Ambulatory Visit: Payer: Medicare HMO | Attending: Family Medicine | Admitting: Occupational Therapy

## 2017-03-13 ENCOUNTER — Ambulatory Visit: Payer: Medicare HMO

## 2017-03-13 DIAGNOSIS — R2689 Other abnormalities of gait and mobility: Secondary | ICD-10-CM | POA: Diagnosis present

## 2017-03-13 DIAGNOSIS — M6281 Muscle weakness (generalized): Secondary | ICD-10-CM

## 2017-03-13 DIAGNOSIS — R278 Other lack of coordination: Secondary | ICD-10-CM | POA: Diagnosis present

## 2017-03-13 DIAGNOSIS — I69351 Hemiplegia and hemiparesis following cerebral infarction affecting right dominant side: Secondary | ICD-10-CM

## 2017-03-13 NOTE — Therapy (Signed)
[place] MAIN [facility] SERVICES [address] [place], [place], [address] Phone: [phone]   Fax:  [phone]  Occupational Therapy Treatment  Patient Details  Name: Curtis Powell MRN: [id number] Date of Birth: [birth year] Referring Provider: Dr. Bobette Mo   Encounter Date: 03/13/2017  OT End of Session - 03/13/17 1653    Visit Number  7    Number of Visits  24    Date for OT Re-Evaluation  05/01/17    Authorization Type  Medicare G Code 7 of 10    OT Start Time  9937    OT Stop Time  1400    OT Time Calculation (min)  1365 min    Activity Tolerance  Patient tolerated treatment well    Behavior During Therapy  [facility] for tasks assessed/performed       Past Medical History:  Diagnosis Date  . GERD (gastroesophageal reflux disease)   . Hyperlipidemia   . Hypertension   . Obesity   . Stroke [facility])     Past Surgical History:  Procedure Laterality Date  . BRAIN SURGERY      There were no vitals filed for this visit.  Subjective Assessment - 03/13/17 1520    Subjective   Pt. reports he is doing well.    Pertinent History  Pt. is a 51 y.o. male who suffered a CVA on 05/01/2016. Pt. was admitted to the hospital. Once discharged, he received Home Health PT and OT services for about a month. Pt. has had multiple CVAs over the past 8 years, and has had multiple falls in the past 6 months. Pt. resides in an apartment. Pt. has caregivers for 80 hours. Pt.'s mother stays with pt. at night, and assists with IADL tasks.      Currently in Pain?  No/denies      OT TREATMENT:  Therapeutic Exercise:  Pt. performed bilateral gross gripping with grip strengthener. Pt. worked on sustaining grip while grasping pegs and reaching at various heights. Gripper was placed in the 3rdresistive slot with the white resistive spring. Pt. Worked on pinch strengthening in bilateral hands for lateral, and 3pt. pinch using yellow, red, green, and blue resistive clips.  Pt. worked on placing the clips at various vertical and horizontal angles. Tactile and verbal cues were required for eliciting the desired movement.  Selfcare:  Pt. Performed toileting tasks standing at the grab bar in the bathroom using his personal urinal. Pt. Was able to use the urinal, and hike clothing with CGA, and increased time to complete. Pt. Hiked pants by alternating between his Right, and left hand.                        OT Education - 03/13/17 1525    Education provided  Yes  (Pended)     Person(s) Educated  Patient  (Pended)     Methods  Explanation;Demonstration;Verbal cues  (Pended)     Comprehension  Verbalized understanding;Returned demonstration  (Pended)        OT Short Term Goals - 03/03/17 1459      OT SHORT TERM GOAL #1   Title  `        OT Long Term Goals - 02/26/17 1352      OT LONG TERM GOAL #1   Title  Pt. will UE functioning to be able to perfrom toilet hygiene with minimal assistance.    Baseline  Pt. is unable    Time  12    Period  Weeks    Status  New      OT LONG TERM GOAL #2   Title  Pt. will complete self grooming with minA    Baseline  Pt. requires extensive assist    Time  12    Period  Weeks    Status  New      OT LONG TERM GOAL #3   Title  Pt. will perfrom self-feeding skills with modified indepndence    Baseline  Pt. has difficutly, and performs with increased spillage    Time  12    Period  Weeks    Status  New      OT LONG TERM GOAL #4   Title  Pt. will perform self dressing with minA and A/E as needed.    Baseline  Pt. requires extensive assist    Time  12    Period  Weeks    Status  New      OT LONG TERM GOAL #5   Title  Pt. will perfrom light home making tasks with minA    Baseline  Pt. requires extensive assistance    Time  12    Period  Weeks    Status  New      OT LONG TERM GOAL #6   Title  Pt. will write his name efficiently with 100% legibility    Baseline  50% legibility with  incresed time required    Time  12    Period  Weeks    Status  New            Plan - 03/13/17 1653    Clinical Impression Statement  Pt. has made progress overall. Pt. has improved with hiking clothing while standing at the grab bar in the bathroom for toileting/urinal use. Pt. required CGA. Pt. reports he has been ranging his right UE at home, reports that he has pain initially, but then tries to work through it. The pain then stops, and he is able to achieve more ROM. Pt. reported no pain during this session. Pt. continues to work on improving UE strength, and coordination skills for improved functional use during ADLs, and IADLs.    Occupational performance deficits (Please refer to evaluation for details):  ADL's;IADL's    Rehab Potential  Fair    OT Frequency  2x / week    OT Duration  12 weeks    OT Treatment/Interventions  Self-care/ADL training;Patient/family education;DME and/or AE instruction;Therapeutic exercise;Moist Heat    Consulted and Agree with Plan of Care  Patient       Patient will benefit from skilled therapeutic intervention in order to improve the following deficits and impairments:  Pain, Decreased cognition, Decreased activity tolerance, Impaired UE functional use, Decreased range of motion, Decreased coordination, Decreased endurance  Visit Diagnosis: Muscle weakness (generalized)  Other lack of coordination    Problem List Patient Active Problem List   Diagnosis Date Noted  . Acute CVA (cerebrovascular accident) ([place]) 05/02/2016  . Left-sided weakness 05/01/2016    Harrel Carina, MS, OTR/L 03/13/2017, 5:00 PM  [place] MAIN [facility] SERVICES [address] [place], [place], [address] Phone: [phone]   Fax:  [phone]  Name: Curtis Powell MRN: [id number] Date of Birth: 02/26/1966

## 2017-03-13 NOTE — Therapy (Signed)
[place] MAIN [facility] SERVICES [address] [place], [place], [address] Phone: [phone]   Fax:  [phone]  Physical Therapy Treatment  Patient Details  Name: Curtis Powell MRN: [id number] Date of Birth: Jan 04, 1966 Referring Provider: Dr. Larinda Buttery   Encounter Date: 03/13/2017  PT End of Session - 03/13/17 1439    Visit Number  7    Number of Visits  16    Date for PT Re-Evaluation  04/07/17    Authorization - Visit Number  7    Authorization - Number of Visits  10    PT Start Time  1430    PT Stop Time  1515    PT Time Calculation (min)  45 min    Equipment Utilized During Treatment  Gait belt    Activity Tolerance  Patient tolerated treatment well;Treatment limited secondary to medical complications (Comment);Patient limited by fatigue    Behavior During Therapy  [facility] for tasks assessed/performed       Past Medical History:  Diagnosis Date  . GERD (gastroesophageal reflux disease)   . Hyperlipidemia   . Hypertension   . Obesity   . Stroke [facility])     Past Surgical History:  Procedure Laterality Date  . BRAIN SURGERY      There were no vitals filed for this visit.  Subjective Assessment - 03/13/17 1438    Subjective  Patient reports having to get a tooth out two days ago but not on pain medication beside ibuprofin.     Pertinent History   Patient is a pleasant 51 year old male who presents to physical therapy for weakness and immobility secondary to CVA.  Had a stroke in Feb 2018. He was previously fully independent, but this stroke caused severe residual deficits, mainly on the right side as well as speech, and now he is unable to walk or perform most of his ADLs on his own. Entire right side is very weak. He still has a little difficulty with speech but his swallowing is improved to baseline. His mom had to move in with him and now is his main caregiver.     Limitations  Sitting;Lifting;Standing;Walking;House  hold activities    How long can you sit comfortably?  5 minutes    How long can you stand comfortably?  5 minutes    How long can you walk comfortably?  10 ft    Patient Stated Goals  Walk without walker, walk further with walker, get some strength in back     Currently in Pain?  No/denies       TREATMENT: Warm up on Nustep level 4 x3 min (unbilled);    WC to Nustep SPT transfer with RW and Min A. Good hand placement noted and initation of movement, however patient had heavy posterior lean requiring Min A to walker to avoid tilt. X 2 reps    WC to mat with SPT transfer and Min A for forward weight shift due to posterior tendency for trunk lean. X 2 reps.   Patient transitioned sitting to supine with Min-Mod A support provided to LE's for placement. Mod A required at trunk for return to seated position.    Patient ambulated 15 feet with RW with CGA to min A at [facility] for turning. Pt. Able to turn to sit with min guiding from PT. Require verbal cueing for upright posture and hip and knee flexion for advancement of limb and forward progression.  Scooting to head of mat with LE's, required assistance placing feet for positioning of increased push.    Following therapeutic activity:      Hooklying: Hip flexion march red tband 20x with cues to increase hip flexion ROM for better hip strengthening;    Hip abduction GTB 2x 15 to increase hip strength and function.     SAQ 15x each leg with 3 second holds at end range     Sitting on mat table:   Seated postural cueing in front of mirror. Tactile cueing between shoulder blades for scapular retraction, verbal cueing for abdominal contraction and breathing.    Marching on box   LAQ 12x each leg, cues for weight shift.    Hip adduction 2x 10x    Soccer ball raises with BUE into flexion for increased core activation and postural muscle strengthening 2x 10x   resisted pf RTB 12x each leg   5x STS with breaks, required CGA and Min A  on fourth stand due to fatigue  Static standing balance 51 seconds required cueing for glute activation and core contraction for upright posture.      Pt. response to medical necessity: Patient will continue to benefit form skilled physical therapy to improve strength, balance, and gait safety.           PT Education - 03/13/17 1438    Education provided  Yes    Education Details  functional strength and posture, proper transfers body mechanics    Person(s) Educated  Patient    Methods  Explanation;Demonstration;Verbal cues    Comprehension  Verbalized understanding;Returned demonstration       PT Short Term Goals - 02/10/17 1743      PT SHORT TERM GOAL #1   Title  Patient will be independent in home exercise program to improve strength/mobility for better functional independence with ADLs.    Baseline  hep given    Time  2    Period  Weeks    Status  New    Target Date  02/24/17      PT SHORT TERM GOAL #2   Title  Patient will require min cueing for STS transfer with CGA for increased independence with mobility.     Baseline  Mod cueing for sequencing of hands, and Min A     Time  2    Period  Weeks    Status  New    Target Date  02/24/17      PT SHORT TERM GOAL #3   Title  Patient will maintain upright posture for > 15 seconds to demonstrate strengthened postural control muscualture     Baseline  5 seconds    Time  2    Period  Weeks    Target Date  02/24/17        PT Long Term Goals - 02/10/17 1745      PT LONG TERM GOAL #1   Title  Patient will perform bed mobility with Supervision to increase independent mobility    Baseline  Require Mod A for EOB to sidelie and back     Time  8    Period  Weeks    Status  New    Target Date  04/07/17      PT LONG TERM GOAL #2   Title  Patient (< 24 years old) will complete five times sit to stand test in < 10 seconds indicating an increased LE strength and improved balance.    Baseline  unable to perform >1 STS      Time  8    Period  Weeks    Status  New    Target Date  04/07/17      PT LONG TERM GOAL #3   Title  Patient will ambulate 40 ft with walker with no breaks to allow for increased mobility within home.     Baseline  ambulates <10 ft    Time  8    Period  Weeks    Status  New    Target Date  04/07/17      PT LONG TERM GOAL #4   Title  Patient will increase BLE gross strength to 4+/5 as to improve functional strength for independent gait, increased standing tolerance and increased ADL ability.    Baseline  3+/5 RLE    Time  8    Period  Weeks    Status  New    Target Date  04/07/17      PT LONG TERM GOAL #5   Title  Patient will increase lower extremity functional scale to >40/80 to demonstrate improved functional mobility and increased tolerance with ADLs.     Baseline  14/80    Time  8    Period  Weeks    Status  New    Target Date  04/07/17            Plan - 03/13/17 1604    Clinical Impression Statement  Patient ambulation is improving with decreased need for assistance with walker, Increased standing duration performed with frequent cueing for postural activation to decrease posterior LOB. Patient will continue to benefit form skilled physical therapy to improve strength, balance, and gait safety.     Rehab Potential  Fair    Clinical Impairments Affecting Rehab Potential  hx of HTN, HLD, CVA, learning disability, Diabetes, brain tumor,     PT Frequency  2x / week    PT Duration  8 weeks    PT Treatment/Interventions  ADLs/Self Care Home Management;Aquatic Therapy;Ultrasound;Moist Heat;Traction;DME Instruction;Gait training;Stair training;Functional mobility training;Therapeutic activities;Therapeutic exercise;Orthotic Fit/Training;Neuromuscular re-education;Balance training;Patient/family education;Manual techniques;Wheelchair mobility training;Passive range of motion;Energy conservation;Taping;Visual/perceptual remediation/compensation    PT Next Visit Plan  review  HEP, ambulate, // bars, posture    PT Home Exercise Plan  see sheet    Consulted and Agree with Plan of Care  Patient;Family member/caregiver    Family Member Consulted  mother       Patient will benefit from skilled therapeutic intervention in order to improve the following deficits and impairments:  Abnormal gait, Decreased activity tolerance, Decreased balance, Decreased knowledge of precautions, Decreased endurance, Decreased coordination, Decreased knowledge of use of DME, Decreased mobility, Decreased range of motion, Difficulty walking, Decreased safety awareness, Decreased strength, Impaired flexibility, Impaired perceived functional ability, Impaired tone, Postural dysfunction, Improper body mechanics, Pain  Visit Diagnosis: Muscle weakness (generalized)  Other lack of coordination  Other abnormalities of gait and mobility  Hemiplegia and hemiparesis following cerebral infarction affecting right dominant side [facility])     Problem List Patient Active Problem List   Diagnosis Date Noted  . Acute CVA (cerebrovascular accident) ([place]) 05/02/2016  . Left-sided weakness 05/01/2016   Janna Arch, PT, DPT   Janna Arch 03/13/2017, 4:05 PM  [place] MAIN [facility] SERVICES [address] [place], [place], [address] Phone: [phone]   Fax:  [phone]  Name: Curtis Powell MRN: [id number] Date of Birth: [birth year]

## 2017-03-17 ENCOUNTER — Encounter: Payer: Medicare HMO | Admitting: Occupational Therapy

## 2017-03-17 ENCOUNTER — Ambulatory Visit: Payer: Medicare HMO

## 2017-03-19 ENCOUNTER — Ambulatory Visit: Payer: Medicare HMO

## 2017-03-19 ENCOUNTER — Ambulatory Visit: Payer: Medicare HMO | Admitting: Occupational Therapy

## 2017-03-24 ENCOUNTER — Ambulatory Visit: Payer: Medicare HMO

## 2017-03-24 ENCOUNTER — Encounter: Payer: Self-pay | Admitting: Occupational Therapy

## 2017-03-24 ENCOUNTER — Ambulatory Visit: Payer: Medicare HMO | Admitting: Occupational Therapy

## 2017-03-24 DIAGNOSIS — R278 Other lack of coordination: Secondary | ICD-10-CM

## 2017-03-24 DIAGNOSIS — M6281 Muscle weakness (generalized): Secondary | ICD-10-CM | POA: Diagnosis not present

## 2017-03-24 DIAGNOSIS — R2689 Other abnormalities of gait and mobility: Secondary | ICD-10-CM

## 2017-03-24 DIAGNOSIS — I69351 Hemiplegia and hemiparesis following cerebral infarction affecting right dominant side: Secondary | ICD-10-CM

## 2017-03-24 NOTE — Therapy (Signed)
[place] MAIN [facility] SERVICES [address] [place], [place], [address] Phone: [phone]   Fax:  [phone]  Occupational Therapy Treatment  Patient Details  Name: Curtis Powell MRN: [id number] Date of Birth: 1966/01/12 Referring Provider (Historical): Dr. Bobette Mo   Encounter Date: 03/24/2017  OT End of Session - 03/24/17 1608    Visit Number  8  (Pended)     Number of Visits  24  (Pended)     Date for OT Re-Evaluation  05/01/17  (Pended)     Authorization Type  Medicare G Code 8 of 10  (Pended)     OT Start Time  1600  (Pended)     OT Stop Time  1645  (Pended)     OT Time Calculation (min)  45 min  (Pended)     Activity Tolerance  Patient tolerated treatment well  (Pended)     Behavior During Therapy  [facility] for tasks assessed/performed  (Pended)        Past Medical History:  Diagnosis Date  . GERD (gastroesophageal reflux disease)   . Hyperlipidemia   . Hypertension   . Obesity   . Stroke [facility])     Past Surgical History:  Procedure Laterality Date  . BRAIN SURGERY      There were no vitals filed for this visit.  Subjective Assessment - 03/24/17 1602    Subjective   Pt. reports a neighbor brought them a pie from church yesterday, however he had intestinal issues following after eating it.     Patient is accompained by:  Family member    Pertinent History  Pt. is a 51 y.o. male who suffered a CVA on 05/01/2016. Pt. was admitted to the hospital. Once discharged, he received Home Health PT and OT services for about a month. Pt. has had multiple CVAs over the past 8 years, and has had multiple falls in the past 6 months. Pt. resides in an apartment. Pt. has caregivers for 80 hours. Pt.'s mother stays with pt. at night, and assists with IADL tasks.      Patient Stated Goals  To be able to throw a ball,a nd dribble a ball.    Currently in Pain?  Yes    Pain Score  5     Pain Location  Shoulder    Pain Orientation  Right    Pain  Descriptors / Indicators  Jabbing;Cramping;Throbbing       OT TREATMENT:  Therapeutic Exercise:  Pt. Worked on UE strengthening with the left shoulder horizontal, and diagonal abduction. Bilateral elbow flexion, and extension. Pt. performed gross gripping with grip strengthener. Pt. worked on sustaining grip while grasping pegs and reaching at various heights. Gripper was placed in the 3rd resistive slot with the white resistive spring.Pt. Worked on pinch strengthening in the right hand for lateral, and 3pt. pinch using yellow, red, and green resistive clips. Pt. worked on placing the clips at various vertical and horizontal angles. Tactile and verbal cues were required for eliciting the desired movement.                            OT Education - 03/24/17 0258    Education provided  Yes    Education Details  ROM, and hand function.    Person(s) Educated  Patient    Methods  Explanation;Demonstration;Verbal cues    Comprehension  Verbalized understanding;Returned demonstration       OT  Short Term Goals - 03/03/17 1459      OT SHORT TERM GOAL #1   Title  `        OT Long Term Goals - 02/26/17 1352      OT LONG TERM GOAL #1   Title  Pt. will UE functioning to be able to perfrom toilet hygiene with minimal assistance.    Baseline  Pt. is unable    Time  12    Period  Weeks    Status  New      OT LONG TERM GOAL #2   Title  Pt. will complete self grooming with minA    Baseline  Pt. requires extensive assist    Time  12    Period  Weeks    Status  New      OT LONG TERM GOAL #3   Title  Pt. will perfrom self-feeding skills with modified indepndence    Baseline  Pt. has difficutly, and performs with increased spillage    Time  12    Period  Weeks    Status  New      OT LONG TERM GOAL #4   Title  Pt. will perform self dressing with minA and A/E as needed.    Baseline  Pt. requires extensive assist    Time  12    Period  Weeks    Status  New       OT LONG TERM GOAL #5   Title  Pt. will perfrom light home making tasks with minA    Baseline  Pt. requires extensive assistance    Time  12    Period  Weeks    Status  New      OT LONG TERM GOAL #6   Title  Pt. will write his name efficiently with 100% legibility    Baseline  50% legibility with incresed time required    Time  12    Period  Weeks    Status  New            Plan - 03/24/17 1638    Clinical Impression Statement  Pt. reports he had 7/10 right shoulder pain over the weekend. Pt. reported intermittent 5/10 pain during the session today. Pt. continues to present with limited UE strength, and coordination skills. Pt. continues to work on improving strength, and coordination skills for improved engagement in ADL, and IADL functioning.    Occupational performance deficits (Please refer to evaluation for details):  ADL's;IADL's    Rehab Potential  Fair    OT Frequency  2x / week    OT Duration  12 weeks    OT Treatment/Interventions  Self-care/ADL training;Patient/family education;DME and/or AE instruction;Therapeutic exercise;Moist Heat    Consulted and Agree with Plan of Care  Patient       Patient will benefit from skilled therapeutic intervention in order to improve the following deficits and impairments:  Pain, Decreased cognition, Decreased activity tolerance, Impaired UE functional use, Decreased range of motion, Decreased coordination, Decreased endurance  Visit Diagnosis: Muscle weakness (generalized)  Other lack of coordination    Problem List Patient Active Problem List   Diagnosis Date Noted  . Acute CVA (cerebrovascular accident) ([place]) 05/02/2016  . Left-sided weakness 05/01/2016    Harrel Carina, MS, OTR/L 03/24/2017, 4:56 PM  [place] MAIN [facility] SERVICES [address] [place], [place], [address] Phone: [phone]   Fax:  [phone]  Name: MARBIN OLSHEFSKI MRN: [id number]  Date of Birth:  [birth year]

## 2017-03-24 NOTE — Therapy (Signed)
[place] MAIN [facility] SERVICES [address] [place], [place], [address] Phone: [phone]   Fax:  [phone]  Physical Therapy Treatment  Patient Details  Name: Curtis Powell MRN: [id number] Date of Birth: [birth year] Referring Provider: Dr. Larinda Buttery   Encounter Date: 03/24/2017  PT End of Session - 03/24/17 1523    Visit Number  8    Number of Visits  16    Date for PT Re-Evaluation  04/07/17    Authorization - Visit Number  8    Authorization - Number of Visits  10    PT Start Time  2725    PT Stop Time  1600    PT Time Calculation (min)  45 min    Equipment Utilized During Treatment  Gait belt    Activity Tolerance  Patient tolerated treatment well;Treatment limited secondary to medical complications (Comment);Patient limited by fatigue    Behavior During Therapy  [facility] for tasks assessed/performed       Past Medical History:  Diagnosis Date  . GERD (gastroesophageal reflux disease)   . Hyperlipidemia   . Hypertension   . Obesity   . Stroke [facility])     Past Surgical History:  Procedure Laterality Date  . BRAIN SURGERY      There were no vitals filed for this visit.  Subjective Assessment - 03/24/17 1521    Subjective  Patient reports walking this past weekend. No stumbles or falls reported since last session. Patient reports compliance with HEP.     Pertinent History   Patient is a pleasant 51 year old male who presents to physical therapy for weakness and immobility secondary to CVA.  Had a stroke in Feb 2018. He was previously fully independent, but this stroke caused severe residual deficits, mainly on the right side as well as speech, and now he is unable to walk or perform most of his ADLs on his own. Entire right side is very weak. He still has a little difficulty with speech but his swallowing is improved to baseline. His mom had to move in with him and now is his main caregiver.     Limitations   Sitting;Lifting;Standing;Walking;House hold activities    How long can you sit comfortably?  5 minutes    How long can you stand comfortably?  5 minutes    How long can you walk comfortably?  10 ft    Patient Stated Goals  Walk without walker, walk further with walker, get some strength in back     Currently in Pain?  No/denies          TREATMENT: Warm up on Nustep level 4 x4 min (unbilled);    WC to Nustep SPT transfer with RW and Min A. Good hand placement noted and initation of movement, however patient had heavy posterior lean requiring Min A to walker to avoid tilt. X 2 reps    WC to mat with SPT transfer and Min A for forward weight shift due to posterior tendency for trunk lean. X 2 reps.     Patient ambulated 18 feet with RW with CGA to min A at [facility] for turning. Pt. Able to turn to sit with min guiding from PT. Require verbal cueing for upright posture and hip and knee flexion for advancement of limb and forward progression.    Sitting on mat table:   Scooting side to side on matt utilizing UE and LE's.   Seated postural cueing in front  of mirror. Tactile cueing between shoulder blades for scapular retraction, verbal cueing for abdominal contraction and breathing.   Pf resisted OTB 10x    Marching on box    LAQ 12x each leg, cues for weight shift.  Hip adduction 2x 10x    Soccer ball raises with BUE into flexion for increased core activation and postural muscle strengthening 2x 10x   Soccer ball rotation in seated for core activation of postural musculature. 2x 10 slightly less rotational range to L side.     Static standing balance without UE assistance  Multiple trials  30, 25,  33 seconds required cueing for glute activation and core contraction for upright posture  Static marching with walker, cues for upright posture, having more difficulty lifting LLE than RLE.  Marland Kitchen   Pt. response to medical necessity: Patient will continue to benefit form skilled physical  therapy to improve strength, balance, and gait safety.                  PT Education - 03/24/17 1523    Education provided  Yes    Education Details  functional strength and transfers    Person(s) Educated  Patient    Methods  Explanation;Demonstration;Verbal cues    Comprehension  Verbalized understanding;Returned demonstration       PT Short Term Goals - 02/10/17 1743      PT SHORT TERM GOAL #1   Title  Patient will be independent in home exercise program to improve strength/mobility for better functional independence with ADLs.    Baseline  hep given    Time  2    Period  Weeks    Status  New    Target Date  02/24/17      PT SHORT TERM GOAL #2   Title  Patient will require min cueing for STS transfer with CGA for increased independence with mobility.     Baseline  Mod cueing for sequencing of hands, and Min A     Time  2    Period  Weeks    Status  New    Target Date  02/24/17      PT SHORT TERM GOAL #3   Title  Patient will maintain upright posture for > 15 seconds to demonstrate strengthened postural control muscualture     Baseline  5 seconds    Time  2    Period  Weeks    Target Date  02/24/17        PT Long Term Goals - 02/10/17 1745      PT LONG TERM GOAL #1   Title  Patient will perform bed mobility with Supervision to increase independent mobility    Baseline  Require Mod A for EOB to sidelie and back     Time  8    Period  Weeks    Status  New    Target Date  04/07/17      PT LONG TERM GOAL #2   Title  Patient (< 23 years old) will complete five times sit to stand test in < 10 seconds indicating an increased LE strength and improved balance.    Baseline  unable to perform >1 STS     Time  8    Period  Weeks    Status  New    Target Date  04/07/17      PT LONG TERM GOAL #3   Title  Patient will ambulate 40 ft with walker with no breaks  to allow for increased mobility within home.     Baseline  ambulates <10 ft    Time  8    Period   Weeks    Status  New    Target Date  04/07/17      PT LONG TERM GOAL #4   Title  Patient will increase BLE gross strength to 4+/5 as to improve functional strength for independent gait, increased standing tolerance and increased ADL ability.    Baseline  3+/5 RLE    Time  8    Period  Weeks    Status  New    Target Date  04/07/17      PT LONG TERM GOAL #5   Title  Patient will increase lower extremity functional scale to >40/80 to demonstrate improved functional mobility and increased tolerance with ADLs.     Baseline  14/80    Time  8    Period  Weeks    Status  New    Target Date  04/07/17            Plan - 03/24/17 1541    Clinical Impression Statement  Patient ambulating with decreased assistance. Requires less cueing for upright posture and forward weight shift. Patient core and postural muscles challenged by dynamic motion in seated position. Increased standing interventions implemented for progression of balance and strength. Patient will continue to benefit form skilled physical therapy to improve strength, balance, and gait safety.     Rehab Potential  Fair    Clinical Impairments Affecting Rehab Potential  hx of HTN, HLD, CVA, learning disability, Diabetes, brain tumor,     PT Frequency  2x / week    PT Duration  8 weeks    PT Treatment/Interventions  ADLs/Self Care Home Management;Aquatic Therapy;Ultrasound;Moist Heat;Traction;DME Instruction;Gait training;Stair training;Functional mobility training;Therapeutic activities;Therapeutic exercise;Orthotic Fit/Training;Neuromuscular re-education;Balance training;Patient/family education;Manual techniques;Wheelchair mobility training;Passive range of motion;Energy conservation;Taping;Visual/perceptual remediation/compensation    PT Next Visit Plan  review HEP, ambulate, // bars, posture    PT Home Exercise Plan  see sheet    Consulted and Agree with Plan of Care  Patient;Family member/caregiver    Family Member Consulted   mother       Patient will benefit from skilled therapeutic intervention in order to improve the following deficits and impairments:  Abnormal gait, Decreased activity tolerance, Decreased balance, Decreased knowledge of precautions, Decreased endurance, Decreased coordination, Decreased knowledge of use of DME, Decreased mobility, Decreased range of motion, Difficulty walking, Decreased safety awareness, Decreased strength, Impaired flexibility, Impaired perceived functional ability, Impaired tone, Postural dysfunction, Improper body mechanics, Pain  Visit Diagnosis: Muscle weakness (generalized)  Other lack of coordination  Other abnormalities of gait and mobility  Hemiplegia and hemiparesis following cerebral infarction affecting right dominant side [facility])     Problem List Patient Active Problem List   Diagnosis Date Noted  . Acute CVA (cerebrovascular accident) ([place]) 05/02/2016  . Left-sided weakness 05/01/2016   Janna Arch, PT, DPT   Janna Arch 03/24/2017, 4:00 PM  [place] MAIN [facility] SERVICES [address] [place], [place], [address] Phone: [phone]   Fax:  [phone]  Name: MAKYE RADLE MRN: [id number] Date of Birth: 20-Nov-1965

## 2017-03-26 ENCOUNTER — Encounter: Payer: Self-pay | Admitting: Occupational Therapy

## 2017-03-26 ENCOUNTER — Ambulatory Visit: Payer: Medicare HMO | Admitting: Occupational Therapy

## 2017-03-26 ENCOUNTER — Ambulatory Visit: Payer: Medicare HMO

## 2017-03-26 DIAGNOSIS — R278 Other lack of coordination: Secondary | ICD-10-CM

## 2017-03-26 DIAGNOSIS — I69351 Hemiplegia and hemiparesis following cerebral infarction affecting right dominant side: Secondary | ICD-10-CM

## 2017-03-26 DIAGNOSIS — M6281 Muscle weakness (generalized): Secondary | ICD-10-CM

## 2017-03-26 DIAGNOSIS — R2689 Other abnormalities of gait and mobility: Secondary | ICD-10-CM

## 2017-03-26 NOTE — Therapy (Signed)
[place] MAIN [facility] SERVICES [address] [place], [place], [address] Phone: [phone]   Fax:  [phone]  Occupational Therapy Treatment  Patient Details  Name: Curtis Powell MRN: [id number] Date of Birth: 10-Nov-1965 Referring Provider (Historical): Dr. Bobette Mo   Encounter Date: 03/26/2017  OT End of Session - 03/26/17 1606    Visit Number  8    Number of Visits  24    Date for OT Re-Evaluation  05/01/17    Authorization Type  Medicare G Code 8 of 10    OT Start Time  4315    OT Stop Time  1615    OT Time Calculation (min)  45 min    Activity Tolerance  Patient tolerated treatment well    Behavior During Therapy  [facility] for tasks assessed/performed       Past Medical History:  Diagnosis Date  . GERD (gastroesophageal reflux disease)   . Hyperlipidemia   . Hypertension   . Obesity   . Stroke [facility])     Past Surgical History:  Procedure Laterality Date  . BRAIN SURGERY      There were no vitals filed for this visit.  Subjective Assessment - 03/26/17 1536    Subjective   Pt. reports that if he covers his RUE, it feels better. Pt. reports his arm feels better when he keeps it warm.  Pt. reports the pain shifts.     Patient is accompained by:  Family member    Pertinent History  Pt. is a 51 y.o. male who suffered a CVA on 05/01/2016. Pt. was admitted to the hospital. Once discharged, he received Home Health PT and OT services for about a month. Pt. has had multiple CVAs over the past 8 years, and has had multiple falls in the past 6 months. Pt. resides in an apartment. Pt. has caregivers for 80 hours. Pt.'s mother stays with pt. at night, and assists with IADL tasks.      Currently in Pain?  Yes    Pain Score  8     Pain Location  Shoulder    Pain Orientation  Right    Pain Descriptors / Indicators  Jabbing;Cramping;Throbbing    Pain Onset  More than a month ago    Multiple Pain Sites  No       OT TREATMENT    Neuro  muscular re-education:  Pt. worked on grasping 1/4" pegs from green resistive putty. Pt. Worked on grasping the pegs with his left hand, while stabilizing with his right hand.  Therapeutic Exercise:  Pt. worked on [employer] for shoulder flexion, abduction. AROM for elbow flexion, extension, wrist flexion extension. Pt. worked with yellow theraband for shoulder flexion,  abduction, elbow flexion, and extension. Pt. performed gross gripping with grip strengthener. Pt. worked on sustaining grip while grasping pegs and reaching at various heights. Gripper was placed in the 2nd resistive slot with the white resistive spring. Pt. Worked on pinch strengthening in the left hand for lateral, and 3pt. pinch using yellow, red, green, and blue resistive clips. Pt. worked on placing the clips at various vertical and horizontal angles. Tactile and verbal cues were required for eliciting the desired movement.                             OT Long Term Goals - 02/26/17 1352      OT LONG TERM GOAL #1   Title  Pt. will UE functioning to be able to perfrom toilet hygiene with minimal assistance.    Baseline  Pt. is unable    Time  12    Period  Weeks    Status  New      OT LONG TERM GOAL #2   Title  Pt. will complete self grooming with minA    Baseline  Pt. requires extensive assist    Time  12    Period  Weeks    Status  New      OT LONG TERM GOAL #3   Title  Pt. will perfrom self-feeding skills with modified indepndence    Baseline  Pt. has difficutly, and performs with increased spillage    Time  12    Period  Weeks    Status  New      OT LONG TERM GOAL #4   Title  Pt. will perform self dressing with minA and A/E as needed.    Baseline  Pt. requires extensive assist    Time  12    Period  Weeks    Status  New      OT LONG TERM GOAL #5   Title  Pt. will perfrom light home making tasks with minA    Baseline  Pt. requires extensive assistance    Time  12    Period  Weeks     Status  New      OT LONG TERM GOAL #6   Title  Pt. will write his name efficiently with 100% legibility    Baseline  50% legibility with incresed time required    Time  12    Period  Weeks    Status  New            Plan - 03/26/17 1719    Clinical Impression Statement  Pt. reports having intermittent 8/10 pain in his RUE. Pt. pain location shifts. Pt. reports that it is worse in the cold wether, when his mother keeps the door open too long, or is he wears a short sleeve shirt. Pt. reports relief when he covers his UE. Pt. continues to work on improving UE strength, and coordination skills for improved ADL, and IADL functioning.     Occupational performance deficits (Please refer to evaluation for details):  ADL's;IADL's    OT Frequency  2x / week    OT Duration  12 weeks    OT Treatment/Interventions  Self-care/ADL training;Patient/family education;DME and/or AE instruction;Therapeutic exercise;Moist Heat    Consulted and Agree with Plan of Care  Patient       Patient will benefit from skilled therapeutic intervention in order to improve the following deficits and impairments:  Pain, Decreased cognition, Decreased activity tolerance, Impaired UE functional use, Decreased range of motion, Decreased coordination, Decreased endurance  Visit Diagnosis: Muscle weakness (generalized)  Other lack of coordination    Problem List Patient Active Problem List   Diagnosis Date Noted  . Acute CVA (cerebrovascular accident) ([place]) 05/02/2016  . Left-sided weakness 05/01/2016    Harrel Carina, MS, OTR/L 03/26/2017, 5:24 PM  [place] MAIN [facility] SERVICES [address] [place], [place], [address] Phone: [phone]   Fax:  [phone]  Name: Curtis Powell MRN: [id number] Date of Birth: 1966-01-06

## 2017-03-26 NOTE — Therapy (Signed)
[place] MAIN [facility] SERVICES [address] [place], [place], [address] Phone: [phone]   Fax:  [phone]  Physical Therapy Treatment  Patient Details  Name: Curtis Powell MRN: [id number] Date of Birth: [birth year] Referring Provider: Dr. Larinda Buttery   Encounter Date: 03/26/2017  PT End of Session - 03/26/17 1701    Visit Number  9    Number of Visits  16    Date for PT Re-Evaluation  04/07/17    Authorization - Visit Number  9    Authorization - Number of Visits  10    PT Start Time  7253    PT Stop Time  1700    PT Time Calculation (min)  45 min    Equipment Utilized During Treatment  Gait belt    Activity Tolerance  Patient tolerated treatment well;Treatment limited secondary to medical complications (Comment);Patient limited by fatigue    Behavior During Therapy  [facility] for tasks assessed/performed       Past Medical History:  Diagnosis Date  . GERD (gastroesophageal reflux disease)   . Hyperlipidemia   . Hypertension   . Obesity   . Stroke [facility])     Past Surgical History:  Procedure Laterality Date  . BRAIN SURGERY      There were no vitals filed for this visit.  Subjective Assessment - 03/26/17 1627    Subjective  Patient reports no stumbling or falls since last session. Reports doing exercises and standing up and sitting down. Requests letter for week day nurse to help him walk between sessions.     Pertinent History   Patient is a pleasant 51 year old male who presents to physical therapy for weakness and immobility secondary to CVA.  Had a stroke in Feb 2018. He was previously fully independent, but this stroke caused severe residual deficits, mainly on the right side as well as speech, and now he is unable to walk or perform most of his ADLs on his own. Entire right side is very weak. He still has a little difficulty with speech but his swallowing is improved to baseline. His mom had to move in with him and  now is his main caregiver.     Limitations  Sitting;Lifting;Standing;Walking;House hold activities    How long can you sit comfortably?  5 minutes    How long can you stand comfortably?  5 minutes    How long can you walk comfortably?  10 ft    Patient Stated Goals  Walk without walker, walk further with walker, get some strength in back     Currently in Pain?  Yes    Pain Score  6     Pain Location  Shoulder    Pain Orientation  Right    Pain Descriptors / Indicators  Shooting;Jabbing    Pain Type  Chronic pain    Pain Onset  More than a month ago       TREATMENT: Warm up on Nustep level 4 x4 min (unbilled);    WC to Nustep SPT transfer with RW and Min A. Good hand placement noted and initation of movement, however patient had heavy posterior lean requiring Min A to walker to avoid tilt. X 2 reps    WC to mat with SPT transfer and Min A for forward weight shift due to posterior tendency for trunk lean. X 2 reps.    Patient ambulated 18 feet with RW with CGA to min A at [facility]  for turning. Pt. Able to turn to sit with min guiding from PT. Require verbal cueing for upright posture and hip and knee flexion for advancement of limb and forward progression.   Ambulate 10 ft x 2 from matt table to // bars, CGA with RW and cues for upright posture.   Ambulate in // bars 4 steps requiring max cueing for sequencing of hands and upright posture  Standing hip flexor stretch in // bars 2x 30 seconds  Turning in // bars requiring tactile and verbal cueing for sequencing of hand placement and steps.   Static standing in // bars with cueing for upright posture x 30 seconds   Sitting on mat table:   Pf resisted OTB 2x 10x IBLE)      LAQ 15x each leg, cues for weight shift.   Hip adduction 2x 10x   Seated marches 20x       .   Pt. response to medical necessity: Patient will continue to benefit form skilled physical therapy to improve strength, balance, and gait safety.                         PT Education - 03/26/17 1700    Education provided  Yes    Education Details  standing posture and hip flexion, note to caregiver    Person(s) Educated  Patient    Methods  Explanation;Demonstration;Verbal cues    Comprehension  Verbalized understanding;Returned demonstration       PT Short Term Goals - 02/10/17 1743      PT SHORT TERM GOAL #1   Title  Patient will be independent in home exercise program to improve strength/mobility for better functional independence with ADLs.    Baseline  hep given    Time  2    Period  Weeks    Status  New    Target Date  02/24/17      PT SHORT TERM GOAL #2   Title  Patient will require min cueing for STS transfer with CGA for increased independence with mobility.     Baseline  Mod cueing for sequencing of hands, and Min A     Time  2    Period  Weeks    Status  New    Target Date  02/24/17      PT SHORT TERM GOAL #3   Title  Patient will maintain upright posture for > 15 seconds to demonstrate strengthened postural control muscualture     Baseline  5 seconds    Time  2    Period  Weeks    Target Date  02/24/17        PT Long Term Goals - 02/10/17 1745      PT LONG TERM GOAL #1   Title  Patient will perform bed mobility with Supervision to increase independent mobility    Baseline  Require Mod A for EOB to sidelie and back     Time  8    Period  Weeks    Status  New    Target Date  04/07/17      PT LONG TERM GOAL #2   Title  Patient (< 28 years old) will complete five times sit to stand test in < 10 seconds indicating an increased LE strength and improved balance.    Baseline  unable to perform >1 STS     Time  8    Period  Weeks    Status  New    Target Date  04/07/17      PT LONG TERM GOAL #3   Title  Patient will ambulate 40 ft with walker with no breaks to allow for increased mobility within home.     Baseline  ambulates <10 ft    Time  8    Period  Weeks    Status  New     Target Date  04/07/17      PT LONG TERM GOAL #4   Title  Patient will increase BLE gross strength to 4+/5 as to improve functional strength for independent gait, increased standing tolerance and increased ADL ability.    Baseline  3+/5 RLE    Time  8    Period  Weeks    Status  New    Target Date  04/07/17      PT LONG TERM GOAL #5   Title  Patient will increase lower extremity functional scale to >40/80 to demonstrate improved functional mobility and increased tolerance with ADLs.     Baseline  14/80    Time  8    Period  Weeks    Status  New    Target Date  04/07/17            Plan - 03/26/17 1701    Clinical Impression Statement  Patient progressing with improved standing ability with bilateral UE support. Patient performed standing interventions in // bars for first time and did not require seated rest break throughout standing stretches and practice turning. Patient has noted hip flexion tightness increasing trunk flexion in standing. Patient will continue to benefit form skilled physical therapy to improve strength, balance, and gait safety.    Rehab Potential  Fair    Clinical Impairments Affecting Rehab Potential  hx of HTN, HLD, CVA, learning disability, Diabetes, brain tumor,     PT Frequency  2x / week    PT Duration  8 weeks    PT Treatment/Interventions  ADLs/Self Care Home Management;Aquatic Therapy;Ultrasound;Moist Heat;Traction;DME Instruction;Gait training;Stair training;Functional mobility training;Therapeutic activities;Therapeutic exercise;Orthotic Fit/Training;Neuromuscular re-education;Balance training;Patient/family education;Manual techniques;Wheelchair mobility training;Passive range of motion;Energy conservation;Taping;Visual/perceptual remediation/compensation    PT Next Visit Plan  recert?     PT Home Exercise Plan  see sheet    Consulted and Agree with Plan of Care  Patient;Family member/caregiver    Family Member Consulted  mother       Patient  will benefit from skilled therapeutic intervention in order to improve the following deficits and impairments:  Abnormal gait, Decreased activity tolerance, Decreased balance, Decreased knowledge of precautions, Decreased endurance, Decreased coordination, Decreased knowledge of use of DME, Decreased mobility, Decreased range of motion, Difficulty walking, Decreased safety awareness, Decreased strength, Impaired flexibility, Impaired perceived functional ability, Impaired tone, Postural dysfunction, Improper body mechanics, Pain  Visit Diagnosis: Muscle weakness (generalized)  Other lack of coordination  Other abnormalities of gait and mobility  Hemiplegia and hemiparesis following cerebral infarction affecting right dominant side [facility])     Problem List Patient Active Problem List   Diagnosis Date Noted  . Acute CVA (cerebrovascular accident) ([place]) 05/02/2016  . Left-sided weakness 05/01/2016  Janna Arch, PT, DPT    Janna Arch 03/26/2017, 5:02 PM  [place] MAIN [facility] SERVICES [address] [place], [place], [address] Phone: [phone]   Fax:  [phone]  Name: Curtis Powell MRN: [id number] Date of Birth: December 01, 1965

## 2017-03-26 NOTE — Patient Instructions (Signed)
Hello Curtis Powell, I am writing in regards to our mutual patient, Curtis Powell. He would greatly benefit from increased ambulation throughout the week, in addition to his ambulation in therapy. He requires use of a walker and cueing for maintaining upright posture for about ~10 ft.  Thank you for your assistance, I greatly appreciate your time.   Janna Arch, DPT  [facility] [phone]

## 2017-04-02 ENCOUNTER — Encounter: Payer: Self-pay | Admitting: Occupational Therapy

## 2017-04-02 ENCOUNTER — Ambulatory Visit: Payer: Medicare HMO | Admitting: Occupational Therapy

## 2017-04-02 ENCOUNTER — Ambulatory Visit: Payer: Medicare HMO

## 2017-04-02 DIAGNOSIS — M6281 Muscle weakness (generalized): Secondary | ICD-10-CM

## 2017-04-02 DIAGNOSIS — I69351 Hemiplegia and hemiparesis following cerebral infarction affecting right dominant side: Secondary | ICD-10-CM

## 2017-04-02 DIAGNOSIS — R2689 Other abnormalities of gait and mobility: Secondary | ICD-10-CM

## 2017-04-02 DIAGNOSIS — R278 Other lack of coordination: Secondary | ICD-10-CM

## 2017-04-02 NOTE — Therapy (Signed)
[place] MAIN [facility] SERVICES [address] [place], [place], [address] Phone: [phone]   Fax:  [phone]  Physical Therapy Treatment  Patient Details  Name: Curtis Powell MRN: [id number] Date of Birth: [birth year] Referring Provider: Dr. Larinda Buttery   Encounter Date: 04/02/2017  PT End of Session - 04/02/17 1637    Visit Number  10    Number of Visits  32    Date for PT Re-Evaluation  05/28/17    Authorization Type  next will be 1/10     Authorization - Visit Number  10    Authorization - Number of Visits  10    PT Start Time  1600    PT Stop Time  1645    PT Time Calculation (min)  45 min    Equipment Utilized During Treatment  Gait belt    Activity Tolerance  Patient tolerated treatment well;Treatment limited secondary to medical complications (Comment);Patient limited by fatigue    Behavior During Therapy  [facility] for tasks assessed/performed       Past Medical History:  Diagnosis Date  . GERD (gastroesophageal reflux disease)   . Hyperlipidemia   . Hypertension   . Obesity   . Stroke [facility])     Past Surgical History:  Procedure Laterality Date  . BRAIN SURGERY      There were no vitals filed for this visit.  Subjective Assessment - 04/02/17 1606    Subjective  Patient reports no stumbles since last session. Had a good christmas.  Patient reports giving note to week day nurse for increased walking between sessions.     Pertinent History   Patient is a pleasant 51 year old male who presents to physical therapy for weakness and immobility secondary to CVA.  Had a stroke in Feb 2018. He was previously fully independent, but this stroke caused severe residual deficits, mainly on the right side as well as speech, and now he is unable to walk or perform most of his ADLs on his own. Entire right side is very weak. He still has a little difficulty with speech but his swallowing is improved to baseline. His mom had to move  in with him and now is his main caregiver.     Limitations  Sitting;Lifting;Standing;Walking;House hold activities    How long can you sit comfortably?  5 minutes    How long can you stand comfortably?  5 minutes    How long can you walk comfortably?  10 ft    Patient Stated Goals  Walk without walker, walk further with walker, get some strength in back     Currently in Pain?  No/denies       STS min cueing with CGA Upright posture for 19 seconds no cues for upright posture, utilization of walker  Bed mobility with supervision 5x STS: 32 seconds one hand one walker one on chair.  72 ft with walker: 29 ft with walker and WC follow  LEFS: 21/80    SPT from WC to plinth table with CGA, no cues needed for hand placement, slight posterior tilt on second trial due to fatigue.   Seated strengthening: #2 ankle weight  LAQ 2x10x  Seated marches 2x 15x                    PT Education - 04/02/17 1636    Education provided  Yes    Education Details  POC, continuation, walking biomechanics  Person(s) Educated  Patient    Methods  Explanation;Demonstration;Verbal cues    Comprehension  Verbalized understanding;Returned demonstration       PT Short Term Goals - 04/02/17 1608      PT SHORT TERM GOAL #1   Title  Patient will be independent in home exercise program to improve strength/mobility for better functional independence with ADLs.    Baseline  hep compliant    Time  2    Period  Weeks    Status  Partially Met      PT SHORT TERM GOAL #2   Title  Patient will require min cueing for STS transfer with CGA for increased independence with mobility.     Baseline  performs with CGA.     Time  2    Period  Weeks    Status  Achieved      PT SHORT TERM GOAL #3   Title  Patient will maintain upright posture for > 15 seconds to demonstrate strengthened postural control muscualture     Baseline  18 seconds     Time  2    Period  Weeks    Status  Achieved         PT Long Term Goals - 04/02/17 1610      PT LONG TERM GOAL #1   Title  Patient will perform bed mobility with Supervision to increase independent mobility    Baseline  Requires Min A for LE.     Time  8    Period  Weeks    Status  Partially Met      PT LONG TERM GOAL #2   Title  Patient (< 13 years old) will complete five times sit to stand test in < 10 seconds indicating an increased LE strength and improved balance.    Baseline  32 seconds from plinth with single UE on seat and single on walker.     Time  8    Period  Weeks    Status  Partially Met      PT LONG TERM GOAL #3   Title  Patient will ambulate 40 ft with walker with no breaks to allow for increased mobility within home.     Baseline  12/26: 29 ft with walker and WC follow with no rest breaks    Time  8    Period  Weeks    Status  Partially Met      PT LONG TERM GOAL #4   Title  Patient will increase BLE gross strength to 4+/5 as to improve functional strength for independent gait, increased standing tolerance and increased ADL ability.    Baseline  3+/5 to 4-/5 RLE    Time  8    Period  Weeks    Status  Partially Met      PT LONG TERM GOAL #5   Title  Patient will increase lower extremity functional scale to >40/80 to demonstrate improved functional mobility and increased tolerance with ADLs.     Baseline  14/80; 12/26: 21/80    Time  8    Period  Weeks    Status  Partially Met      Additional Long Term Goals   Additional Long Term Goals  Yes      PT LONG TERM GOAL #6   Title  Patient will walk through a door involving one small step up/down to increase functional independence.     Baseline  unable to step up  Time  8    Period  Weeks    Status  New    Target Date  05/28/17            Plan - April 30, 2017 1639    Clinical Impression Statement  Patient improving with functional mobility. Patient able to perform sit to stand transfer with CGA and UE support to allow for increased independence  with mobility. 5x STS =32 seconds. Pt. Ambulated 29 ft with walker and WC follow with occasional cueing for upright posture and lifting legs. Ambulate with step to pattern and increased R foot drag. LEFS= 21/80. Patient demonstrates progress towards goals at this time in all functions of mobility. Patient will continue to benefit from skilled physical therapy to improve strength, balance, and gait safety.     Rehab Potential  Fair    Clinical Impairments Affecting Rehab Potential  hx of HTN, HLD, CVA, learning disability, Diabetes, brain tumor,     PT Frequency  2x / week    PT Duration  8 weeks    PT Treatment/Interventions  ADLs/Self Care Home Management;Aquatic Therapy;Ultrasound;Moist Heat;Traction;DME Instruction;Gait training;Stair training;Functional mobility training;Therapeutic activities;Therapeutic exercise;Orthotic Fit/Training;Neuromuscular re-education;Balance training;Patient/family education;Manual techniques;Wheelchair mobility training;Passive range of motion;Energy conservation;Taping;Visual/perceptual remediation/compensation    PT Next Visit Plan  walking in hallway with WC follow     PT Home Exercise Plan  see sheet    Consulted and Agree with Plan of Care  Patient;Family member/caregiver    Family Member Consulted  mother       Patient will benefit from skilled therapeutic intervention in order to improve the following deficits and impairments:  Abnormal gait, Decreased activity tolerance, Decreased balance, Decreased knowledge of precautions, Decreased endurance, Decreased coordination, Decreased knowledge of use of DME, Decreased mobility, Decreased range of motion, Difficulty walking, Decreased safety awareness, Decreased strength, Impaired flexibility, Impaired perceived functional ability, Impaired tone, Postural dysfunction, Improper body mechanics, Pain  Visit Diagnosis: Muscle weakness (generalized)  Other lack of coordination  Other abnormalities of gait and  mobility  Hemiplegia and hemiparesis following cerebral infarction affecting right dominant side (HCC)   G-Codes - 30-Apr-2017 1639    Functional Assessment Tool Used (Outpatient Only)  LEFS, MMT, gait, transfers, 5x STS, clinical judgement    Functional Limitation  Mobility: Walking and moving around    Mobility: Walking and Moving Around Current Status ([id number])  At least 60 percent but less than 80 percent impaired, limited or restricted    Mobility: Walking and Moving Around Goal Status [id number])  At least 40 percent but less than 60 percent impaired, limited or restricted       Problem List Patient Active Problem List   Diagnosis Date Noted  . Acute CVA (cerebrovascular accident) ([place]) 05/02/2016  . Left-sided weakness 05/01/2016   Janna Arch, PT, DPT   Janna Arch 2017/04/30, 4:48 PM  [place] MAIN [facility] SERVICES [address] [place], [place], [address] Phone: [phone]   Fax:  [phone]  Name: JAQUARI RECKNER MRN: [id number] Date of Birth: 1965/10/27

## 2017-04-02 NOTE — Therapy (Signed)
[place] MAIN [facility] SERVICES [address] [place], [place], [address] Phone: [phone]   Fax:  [phone]  Occupational Therapy Treatment  Patient Details  Name: Curtis Powell MRN: [id number] Date of Birth: 07-31-1965 Referring Provider (Historical): Dr. Bobette Mo   Encounter Date: 04/02/2017  OT End of Session - 04/02/17 1525    Visit Number  9    Number of Visits  24    Date for OT Re-Evaluation  05/01/17    Authorization Type  Medicare G Code 9 of 10    OT Start Time  7026    OT Stop Time  1600    OT Time Calculation (min)  42 min    Activity Tolerance  Patient tolerated treatment well    Behavior During Therapy  [facility] for tasks assessed/performed       Past Medical History:  Diagnosis Date  . GERD (gastroesophageal reflux disease)   . Hyperlipidemia   . Hypertension   . Obesity   . Stroke [facility])     Past Surgical History:  Procedure Laterality Date  . BRAIN SURGERY      There were no vitals filed for this visit.  Subjective Assessment - 04/02/17 1523    Subjective   Pt. reports having RUE pain.    Patient is accompained by:  Family member    Pertinent History  Pt. is a 51 y.o. male who suffered a CVA on 05/01/2016. Pt. was admitted to the hospital. Once discharged, he received Home Health PT and OT services for about a month. Pt. has had multiple CVAs over the past 8 years, and has had multiple falls in the past 6 months. Pt. resides in an apartment. Pt. has caregivers for 80 hours. Pt.'s mother stays with pt. at night, and assists with IADL tasks.      Patient Stated Goals  To be able to throw a ball,a nd dribble a ball.    Currently in Pain?  Yes    Pain Score  5     Pain Orientation  Right    Pain Descriptors / Indicators  Jabbing      OT TREATMENT    Therapeutic Exercise:  Pt. Tolerated slow gentle PROM/AAROM for Right shoulder in all joint ranges. 1# dumbbell ex. for elbow flexion and extension, forearm  supination/pronation, wrist flexion/extension, and radial deviation. Pt. requires rest breaks and verbal cues for proper technique. Pt. Performed hand strengthening with green theraputty. Pt. required cues for proper technique. Pt. was upgraded to green theraputty. Pt. Was provided with a visual handout, and reviewed the exercises with pt. Visual demonstration, and verbal cues were required. Pt. worked on  [employer] ex. For gross grip, lateral pinch, 3pt. pinch, gross digit extension, and thumb opposition. Pt. required verbal and tactile cues for proper technique.                         OT Short Term Goals - 03/03/17 1459      OT SHORT TERM GOAL #1   Title  `        OT Long Term Goals - 02/26/17 1352      OT LONG TERM GOAL #1   Title  Pt. will UE functioning to be able to perfrom toilet hygiene with minimal assistance.    Baseline  Pt. is unable    Time  12    Period  Weeks    Status  New  OT LONG TERM GOAL #2   Title  Pt. will complete self grooming with minA    Baseline  Pt. requires extensive assist    Time  12    Period  Weeks    Status  New      OT LONG TERM GOAL #3   Title  Pt. will perfrom self-feeding skills with modified indepndence    Baseline  Pt. has difficutly, and performs with increased spillage    Time  12    Period  Weeks    Status  New      OT LONG TERM GOAL #4   Title  Pt. will perform self dressing with minA and A/E as needed.    Baseline  Pt. requires extensive assist    Time  12    Period  Weeks    Status  New      OT LONG TERM GOAL #5   Title  Pt. will perfrom light home making tasks with minA    Baseline  Pt. requires extensive assistance    Time  12    Period  Weeks    Status  New      OT LONG TERM GOAL #6   Title  Pt. will write his name efficiently with 100% legibility    Baseline  50% legibility with incresed time required    Time  12    Period  Weeks    Status  New            Plan - 04/02/17  1740    Clinical Impression Statement  pt. reports having had pain in his right shoulder over the holiday. Pt. reports the most pain occurs when he tries to reach behind his chair. Pt. tolerated ROM to the right shoulder, and reports that his arm feels better when getting stretched out. Pt. reports he doesn't have anyone at home to help him with ROM. Pt. continues to work on improving UE strength, and coordination skills for improved engagement in ADLs, and IADLs.    Occupational performance deficits (Please refer to evaluation for details):  ADL's;IADL's    Rehab Potential  Fair    OT Frequency  2x / week    OT Duration  12 weeks    OT Treatment/Interventions  Self-care/ADL training;Patient/family education;DME and/or AE instruction;Therapeutic exercise;Moist Heat    Consulted and Agree with Plan of Care  Patient       Patient will benefit from skilled therapeutic intervention in order to improve the following deficits and impairments:  Pain, Decreased cognition, Decreased activity tolerance, Impaired UE functional use, Decreased range of motion, Decreased coordination, Decreased endurance  Visit Diagnosis: Muscle weakness (generalized)    Problem List Patient Active Problem List   Diagnosis Date Noted  . Acute CVA (cerebrovascular accident) ([place]) 05/02/2016  . Left-sided weakness 05/01/2016    Harrel Carina, MS, OTR/L 04/02/2017, 5:45 PM  [place] MAIN [facility] SERVICES [address] [place], [place], [address] Phone: [phone]   Fax:  [phone]  Name: Curtis Powell MRN: [id number] Date of Birth: Nov 19, 1965

## 2017-04-09 ENCOUNTER — Ambulatory Visit: Payer: Medicare HMO | Admitting: Occupational Therapy

## 2017-04-09 ENCOUNTER — Ambulatory Visit: Payer: Medicare HMO | Attending: Family Medicine

## 2017-04-09 ENCOUNTER — Encounter: Payer: Self-pay | Admitting: Occupational Therapy

## 2017-04-09 DIAGNOSIS — R2689 Other abnormalities of gait and mobility: Secondary | ICD-10-CM | POA: Diagnosis present

## 2017-04-09 DIAGNOSIS — I69351 Hemiplegia and hemiparesis following cerebral infarction affecting right dominant side: Secondary | ICD-10-CM | POA: Diagnosis present

## 2017-04-09 DIAGNOSIS — M6281 Muscle weakness (generalized): Secondary | ICD-10-CM | POA: Diagnosis present

## 2017-04-09 DIAGNOSIS — R278 Other lack of coordination: Secondary | ICD-10-CM | POA: Diagnosis present

## 2017-04-09 NOTE — Therapy (Signed)
[place] MAIN [facility] SERVICES [address] [place], [place], [address] Phone: [phone]   Fax:  [phone]  Occupational Therapy Treatment  Patient Details  Name: Curtis Powell MRN: [id number] Date of Birth: May 20, 1965 Referring Provider (Historical): Dr. Bobette Mo   Encounter Date: 04/09/2017  OT End of Session - 04/09/17 1610    Visit Number  10    Number of Visits  24    Date for OT Re-Evaluation  05/01/17    OT Start Time  1607    OT Stop Time  1645    OT Time Calculation (min)  38 min    Activity Tolerance  Patient tolerated treatment well    Behavior During Therapy  [facility] for tasks assessed/performed       Past Medical History:  Diagnosis Date  . GERD (gastroesophageal reflux disease)   . Hyperlipidemia   . Hypertension   . Obesity   . Stroke [facility])     Past Surgical History:  Procedure Laterality Date  . BRAIN SURGERY      There were no vitals filed for this visit.  Subjective Assessment - 04/09/17 1608    Subjective   Pt. reports he has no pain, and his right arm is feeling better.    Patient is accompained by:  Family member    Pertinent History  Pt. is a 52 y.o. male who suffered a CVA on 05/01/2016. Pt. was admitted to the hospital. Once discharged, he received Home Health PT and OT services for about a month. Pt. has had multiple CVAs over the past 8 years, and has had multiple falls in the past 6 months. Pt. resides in an apartment. Pt. has caregivers for 80 hours. Pt.'s mother stays with pt. at night, and assists with IADL tasks.      Currently in Pain?  No/denies      OT TREATMENT    Neuro muscular re-education:  Therapeutic Exercise:  Pt. performed gross gripping with grip strengthener. Pt. worked on sustaining grip while grasping pegs and reaching at various heights. Gripper was placed in the 3rd resistive slot with the white resistive spring.Pt. Tolerated AAROM for shoulder flexion, abduction, adduction,  horizontal abduction, and adduction. Pt. worked with yellow theraband exercises for the LUE for shoulder flexion, diagonal shoulder abduction, elbow, flexion, and extension. Pt. required verbal cues for proper technique, and for the LUE only with theraband.                          OT Education - 04/09/17 1609    Education Details  Pt. education was provided about  UE functioning.    Person(s) Educated  Patient    Methods  Explanation;Demonstration;Verbal cues    Comprehension  Verbalized understanding;Returned demonstration       OT Short Term Goals - 03/03/17 1459      OT SHORT TERM GOAL #1   Title  `        OT Long Term Goals - 02/26/17 1352      OT LONG TERM GOAL #1   Title  Pt. will UE functioning to be able to perfrom toilet hygiene with minimal assistance.    Baseline  Pt. is unable    Time  12    Period  Weeks    Status  New      OT LONG TERM GOAL #2   Title  Pt. will complete self grooming with minA  Baseline  Pt. requires extensive assist    Time  12    Period  Weeks    Status  New      OT LONG TERM GOAL #3   Title  Pt. will perfrom self-feeding skills with modified indepndence    Baseline  Pt. has difficutly, and performs with increased spillage    Time  12    Period  Weeks    Status  New      OT LONG TERM GOAL #4   Title  Pt. will perform self dressing with minA and A/E as needed.    Baseline  Pt. requires extensive assist    Time  12    Period  Weeks    Status  New      OT LONG TERM GOAL #5   Title  Pt. will perfrom light home making tasks with minA    Baseline  Pt. requires extensive assistance    Time  12    Period  Weeks    Status  New      OT LONG TERM GOAL #6   Title  Pt. will write his name efficiently with 100% legibility    Baseline  50% legibility with incresed time required    Time  12    Period  Weeks    Status  New            Plan - 04/09/17 1610    Clinical Impression Statement  Pt. reports the  theraputty is helping him, and he is using it daily. Pt. reports his right arm has improved, and has no pain. Pt. continues to work on improving UE functioning for improved engagement in ADLs, and IADLs.    Occupational performance deficits (Please refer to evaluation for details):  ADL's;IADL's    Rehab Potential  Fair    OT Frequency  2x / week    OT Duration  12 weeks    OT Treatment/Interventions  Self-care/ADL training;Patient/family education;DME and/or AE instruction;Therapeutic exercise;Moist Heat    Clinical Decision Making  Several treatment options, min-mod task modification necessary       Patient will benefit from skilled therapeutic intervention in order to improve the following deficits and impairments:  Pain, Decreased cognition, Decreased activity tolerance, Impaired UE functional use, Decreased range of motion, Decreased coordination, Decreased endurance  Visit Diagnosis: Muscle weakness (generalized)  Other lack of coordination    Problem List Patient Active Problem List   Diagnosis Date Noted  . Acute CVA (cerebrovascular accident) ([place]) 05/02/2016  . Left-sided weakness 05/01/2016    Harrel Carina, MS,  OTR/L 04/09/2017, 4:33 PM  [place] MAIN [facility] SERVICES [address] [place], [place], [address] Phone: [phone]   Fax:  [phone]  Name: Curtis Powell MRN: [id number] Date of Birth: [birth year]

## 2017-04-09 NOTE — Therapy (Signed)
[place] MAIN [facility] SERVICES [address] [place], [place], [address] Phone: [phone]   Fax:  [phone]  Physical Therapy Treatment  Patient Details  Name: Curtis Powell MRN: [id number] Date of Birth: May 12, 1965 Referring Provider: Dr. Larinda Buttery   Encounter Date: 04/09/2017  PT End of Session - 04/09/17 1522    Visit Number  11    Number of Visits  32    Date for PT Re-Evaluation  05/28/17    Authorization - Visit Number  1    Authorization - Number of Visits  10    PT Start Time  3016    PT Stop Time  1600    PT Time Calculation (min)  45 min    Equipment Utilized During Treatment  Gait belt    Activity Tolerance  Patient tolerated treatment well;Treatment limited secondary to medical complications (Comment);Patient limited by fatigue    Behavior During Therapy  [facility] for tasks assessed/performed       Past Medical History:  Diagnosis Date  . GERD (gastroesophageal reflux disease)   . Hyperlipidemia   . Hypertension   . Obesity   . Stroke [facility])     Past Surgical History:  Procedure Laterality Date  . BRAIN SURGERY      There were no vitals filed for this visit.  Subjective Assessment - 04/09/17 1521    Subjective  Patient reports walking outside of apartment since last session.  Compliant with HEP reports no LOB or falls.     Pertinent History   Patient is a pleasant 52 year old male who presents to physical therapy for weakness and immobility secondary to CVA.  Had a stroke in Feb 2018. He was previously fully independent, but this stroke caused severe residual deficits, mainly on the right side as well as speech, and now he is unable to walk or perform most of his ADLs on his own. Entire right side is very weak. He still has a little difficulty with speech but his swallowing is improved to baseline. His mom had to move in with him and now is his main caregiver.     Limitations   Sitting;Lifting;Standing;Walking;House hold activities    How long can you sit comfortably?  5 minutes    How long can you stand comfortably?  5 minutes    How long can you walk comfortably?  10 ft    Patient Stated Goals  Walk without walker, walk further with walker, get some strength in back     Currently in Pain?  No/denies       TREATMENT: Warm up on Nustep level3x68mn (unbilled);  WC to Nustep SPT transfer with RW and Min A. Good hand placement noted and initation of movement, however patient had heavy posterior lean requiring Min A to walker to avoid tilt. X 2 reps   WC to mat with SPT transfer and Min A for forward weight shift due to posterior tendency for trunk lean. X 2 reps.   Gait: Walking with RW and CGA in hallway with cues for upright posture and WC follow. Ambulated 47 ft. Occasional LLE scuffing, step to pattern with occasionally leading with L and occasionally leading with R.       Seated strengthening: #5 ankle weight             LAQ 2x12x with 3 second hold at end range.              Seated marches  2x 15x, cues for upright posture and core activation  Seated abduction GTB 1x 15 3 second holds end range   Seated adduction ball squeeze 1x 15 5 second holds     Pt. response to medical necessity:  Patient will continue to benefit from skilled physical therapy to improve strength, balance and gait safety.                 PT Education - 04/09/17 1522    Education provided  Yes    Education Details  ambulatory body mechanics , LE strenghtening and posture    Person(s) Educated  Patient    Methods  Explanation;Demonstration;Verbal cues    Comprehension  Verbalized understanding;Returned demonstration       PT Short Term Goals - 04/02/17 1608      PT SHORT TERM GOAL #1   Title  Patient will be independent in home exercise program to improve strength/mobility for better functional independence with ADLs.    Baseline  hep compliant     Time  2    Period  Weeks    Status  Partially Met      PT SHORT TERM GOAL #2   Title  Patient will require min cueing for STS transfer with CGA for increased independence with mobility.     Baseline  performs with CGA.     Time  2    Period  Weeks    Status  Achieved      PT SHORT TERM GOAL #3   Title  Patient will maintain upright posture for > 15 seconds to demonstrate strengthened postural control muscualture     Baseline  18 seconds     Time  2    Period  Weeks    Status  Achieved        PT Long Term Goals - 04/02/17 1610      PT LONG TERM GOAL #1   Title  Patient will perform bed mobility with Supervision to increase independent mobility    Baseline  Requires Min A for LE.     Time  8    Period  Weeks    Status  Partially Met      PT LONG TERM GOAL #2   Title  Patient (< 66 years old) will complete five times sit to stand test in < 10 seconds indicating an increased LE strength and improved balance.    Baseline  32 seconds from plinth with single UE on seat and single on walker.     Time  8    Period  Weeks    Status  Partially Met      PT LONG TERM GOAL #3   Title  Patient will ambulate 40 ft with walker with no breaks to allow for increased mobility within home.     Baseline  12/26: 29 ft with walker and WC follow with no rest breaks    Time  8    Period  Weeks    Status  Partially Met      PT LONG TERM GOAL #4   Title  Patient will increase BLE gross strength to 4+/5 as to improve functional strength for independent gait, increased standing tolerance and increased ADL ability.    Baseline  3+/5 to 4-/5 RLE    Time  8    Period  Weeks    Status  Partially Met      PT LONG TERM GOAL #5   Title  Patient will  increase lower extremity functional scale to >40/80 to demonstrate improved functional mobility and increased tolerance with ADLs.     Baseline  14/80; 12/26: 21/80    Time  8    Period  Weeks    Status  Partially Met      Additional Long Term Goals    Additional Long Term Goals  Yes      PT LONG TERM GOAL #6   Title  Patient will walk through a door involving one small step up/down to increase functional independence.     Baseline  unable to step up     Time  8    Period  Weeks    Status  New    Target Date  05/28/17            Plan - 04/09/17 1546    Clinical Impression Statement  Patient improving in ambulatory mechanics and capacity. Ambulated 79 ft with RW and CGA with WC follow, decreased need for cueing. Patient performed seated strengthening interventions with increased ankle weights demonstrating improved strength. Patient will continue to benefit from skilled physical therapy to improve strength, balance and gait safety.     Rehab Potential  Fair    Clinical Impairments Affecting Rehab Potential  hx of HTN, HLD, CVA, learning disability, Diabetes, brain tumor,     PT Frequency  2x / week    PT Duration  8 weeks    PT Treatment/Interventions  ADLs/Self Care Home Management;Aquatic Therapy;Ultrasound;Moist Heat;Traction;DME Instruction;Gait training;Stair training;Functional mobility training;Therapeutic activities;Therapeutic exercise;Orthotic Fit/Training;Neuromuscular re-education;Balance training;Patient/family education;Manual techniques;Wheelchair mobility training;Passive range of motion;Energy conservation;Taping;Visual/perceptual remediation/compensation    PT Next Visit Plan  walking in hallway with WC follow     PT Home Exercise Plan  see sheet    Consulted and Agree with Plan of Care  Patient;Family member/caregiver    Family Member Consulted  mother       Patient will benefit from skilled therapeutic intervention in order to improve the following deficits and impairments:  Abnormal gait, Decreased activity tolerance, Decreased balance, Decreased knowledge of precautions, Decreased endurance, Decreased coordination, Decreased knowledge of use of DME, Decreased mobility, Decreased range of motion, Difficulty  walking, Decreased safety awareness, Decreased strength, Impaired flexibility, Impaired perceived functional ability, Impaired tone, Postural dysfunction, Improper body mechanics, Pain  Visit Diagnosis: Muscle weakness (generalized)  Other lack of coordination  Other abnormalities of gait and mobility  Hemiplegia and hemiparesis following cerebral infarction affecting right dominant side [facility])     Problem List Patient Active Problem List   Diagnosis Date Noted  . Acute CVA (cerebrovascular accident) ([place]) 05/02/2016  . Left-sided weakness 05/01/2016   Janna Arch, PT, DPT   Janna Arch 04/09/2017, 4:05 PM  [place] MAIN [facility] SERVICES [address] [place], [place], [address] Phone: [phone]   Fax:  [phone]  Name: Curtis Powell MRN: [id number] Date of Birth: 10-10-1965

## 2017-04-15 ENCOUNTER — Ambulatory Visit: Payer: Medicare HMO

## 2017-04-15 ENCOUNTER — Encounter: Payer: Self-pay | Admitting: Occupational Therapy

## 2017-04-15 ENCOUNTER — Ambulatory Visit: Payer: Medicare HMO | Admitting: Occupational Therapy

## 2017-04-15 DIAGNOSIS — R278 Other lack of coordination: Secondary | ICD-10-CM

## 2017-04-15 DIAGNOSIS — I69351 Hemiplegia and hemiparesis following cerebral infarction affecting right dominant side: Secondary | ICD-10-CM

## 2017-04-15 DIAGNOSIS — M6281 Muscle weakness (generalized): Secondary | ICD-10-CM | POA: Diagnosis not present

## 2017-04-15 DIAGNOSIS — R2689 Other abnormalities of gait and mobility: Secondary | ICD-10-CM

## 2017-04-15 NOTE — Therapy (Signed)
[place] MAIN [facility] SERVICES [address] [place], [place], [address] Phone: [phone]   Fax:  [phone]  Physical Therapy Treatment  Patient Details  Name: Curtis Powell MRN: [id number] Date of Birth: [birth year] Referring Provider: Dr. Larinda Buttery   Encounter Date: 04/15/2017  PT End of Session - 04/15/17 1419    Visit Number  12    Number of Visits  32    Date for PT Re-Evaluation  05/28/17    Authorization - Visit Number  2    Authorization - Number of Visits  10    PT Start Time  1655    PT Stop Time  1500    PT Time Calculation (min)  45 min    Equipment Utilized During Treatment  Gait belt    Activity Tolerance  Patient tolerated treatment well;Treatment limited secondary to medical complications (Comment);Patient limited by fatigue    Behavior During Therapy  [facility] for tasks assessed/performed       Past Medical History:  Diagnosis Date  . GERD (gastroesophageal reflux disease)   . Hyperlipidemia   . Hypertension   . Obesity   . Stroke [facility])     Past Surgical History:  Procedure Laterality Date  . BRAIN SURGERY      There were no vitals filed for this visit.  Subjective Assessment - 04/15/17 1418    Subjective  Patient reports R shoulder stiffness. Reports walking at home outside over the weekend and complient with HEP. No stumbles or falls reported.     Pertinent History   Patient is a pleasant 52 year old male who presents to physical therapy for weakness and immobility secondary to CVA.  Had a stroke in Feb 2018. He was previously fully independent, but this stroke caused severe residual deficits, mainly on the right side as well as speech, and now he is unable to walk or perform most of his ADLs on his own. Entire right side is very weak. He still has a little difficulty with speech but his swallowing is improved to baseline. His mom had to move in with him and now is his main caregiver.     Limitations   Sitting;Lifting;Standing;Walking;House hold activities    How long can you sit comfortably?  5 minutes    How long can you stand comfortably?  5 minutes    How long can you walk comfortably?  10 ft    Patient Stated Goals  Walk without walker, walk further with walker, get some strength in back     Currently in Pain?  Yes    Pain Score  7     Pain Location  Shoulder    Pain Orientation  Right    Pain Descriptors / Indicators  Jabbing    Pain Type  Chronic pain    Pain Onset  More than a month ago    Pain Frequency  Intermittent       TREATMENT: Warm up on Nustep level 1 x4 min; no UE just BLE due to R shoulder pain.  (unbilled);    WC to Nustep SPT transfer with RW and Min A. Good hand placement noted and initation of movement,no posterior LOB X 2 reps    WC to mat with SPT transfer and Min A for forward weight shift due to posterior tendency for trunk lean. X 2 reps.      Gait: Walking with RW and CGA in hallway with cues for upright posture and  WC follow. Ambulated 60 ft. Occasional LLE scuffing, step to pattern with occasionally leading with L and occasionally leading with R.     Seated strengthening: #5 ankle weight             LAQ 2x12x with 3 second hold at end range.              Seated marches 2x 15x, cues for upright posture and core activation   Seated abduction GTB 1x 15 3 second holds end range    Seated adduction ball squeeze 1x 15 5 second holds    Pt. response to medical necessity:  Patient will continue to benefit from skilled physical therapy to improve strength, balance and gait safety.                       PT Education - 04/15/17 1419    Education provided  Yes    Education Details  functional ambulation, LE strength, balance    Person(s) Educated  Patient    Methods  Explanation;Demonstration;Verbal cues    Comprehension  Verbalized understanding;Returned demonstration       PT Short Term Goals - 04/02/17 1608      PT SHORT  TERM GOAL #1   Title  Patient will be independent in home exercise program to improve strength/mobility for better functional independence with ADLs.    Baseline  hep compliant    Time  2    Period  Weeks    Status  Partially Met      PT SHORT TERM GOAL #2   Title  Patient will require min cueing for STS transfer with CGA for increased independence with mobility.     Baseline  performs with CGA.     Time  2    Period  Weeks    Status  Achieved      PT SHORT TERM GOAL #3   Title  Patient will maintain upright posture for > 15 seconds to demonstrate strengthened postural control muscualture     Baseline  18 seconds     Time  2    Period  Weeks    Status  Achieved        PT Long Term Goals - 04/02/17 1610      PT LONG TERM GOAL #1   Title  Patient will perform bed mobility with Supervision to increase independent mobility    Baseline  Requires Min A for LE.     Time  8    Period  Weeks    Status  Partially Met      PT LONG TERM GOAL #2   Title  Patient (< 26 years old) will complete five times sit to stand test in < 10 seconds indicating an increased LE strength and improved balance.    Baseline  32 seconds from plinth with single UE on seat and single on walker.     Time  8    Period  Weeks    Status  Partially Met      PT LONG TERM GOAL #3   Title  Patient will ambulate 40 ft with walker with no breaks to allow for increased mobility within home.     Baseline  12/26: 29 ft with walker and WC follow with no rest breaks    Time  8    Period  Weeks    Status  Partially Met      PT LONG TERM GOAL #4  Title  Patient will increase BLE gross strength to 4+/5 as to improve functional strength for independent gait, increased standing tolerance and increased ADL ability.    Baseline  3+/5 to 4-/5 RLE    Time  8    Period  Weeks    Status  Partially Met      PT LONG TERM GOAL #5   Title  Patient will increase lower extremity functional scale to >40/80 to demonstrate  improved functional mobility and increased tolerance with ADLs.     Baseline  14/80; 12/26: 21/80    Time  8    Period  Weeks    Status  Partially Met      Additional Long Term Goals   Additional Long Term Goals  Yes      PT LONG TERM GOAL #6   Title  Patient will walk through a door involving one small step up/down to increase functional independence.     Baseline  unable to step up     Time  8    Period  Weeks    Status  New    Target Date  05/28/17            Plan - 04/15/17 1443    Clinical Impression Statement  Patient demonstrated improved ambulatory mechanics and capacity for ambulation, walking 60 ft with RW and CGA. Strength with 5lb ankle weights in seated position performed with decreased need for cueing for upright posture RLE weakness challenging to patient.  Patient will continue to benefit from skilled physical therapy to improve strength, balance and gait safety    Rehab Potential  Fair    Clinical Impairments Affecting Rehab Potential  hx of HTN, HLD, CVA, learning disability, Diabetes, brain tumor,     PT Frequency  2x / week    PT Duration  8 weeks    PT Treatment/Interventions  ADLs/Self Care Home Management;Aquatic Therapy;Ultrasound;Moist Heat;Traction;DME Instruction;Gait training;Stair training;Functional mobility training;Therapeutic activities;Therapeutic exercise;Orthotic Fit/Training;Neuromuscular re-education;Balance training;Patient/family education;Manual techniques;Wheelchair mobility training;Passive range of motion;Energy conservation;Taping;Visual/perceptual remediation/compensation    PT Next Visit Plan  walking in hallway with WC follow     PT Home Exercise Plan  see sheet    Consulted and Agree with Plan of Care  Patient;Family member/caregiver    Family Member Consulted  mother       Patient will benefit from skilled therapeutic intervention in order to improve the following deficits and impairments:  Abnormal gait, Decreased activity  tolerance, Decreased balance, Decreased knowledge of precautions, Decreased endurance, Decreased coordination, Decreased knowledge of use of DME, Decreased mobility, Decreased range of motion, Difficulty walking, Decreased safety awareness, Decreased strength, Impaired flexibility, Impaired perceived functional ability, Impaired tone, Postural dysfunction, Improper body mechanics, Pain  Visit Diagnosis: Muscle weakness (generalized)  Other lack of coordination  Other abnormalities of gait and mobility  Hemiplegia and hemiparesis following cerebral infarction affecting right dominant side [facility])     Problem List Patient Active Problem List   Diagnosis Date Noted  . Acute CVA (cerebrovascular accident) ([place]) 05/02/2016  . Left-sided weakness 05/01/2016   Janna Arch, PT, DPT   Janna Arch 04/15/2017, 2:58 PM  [place] MAIN [facility] SERVICES [address] [place], [place], [address] Phone: [phone]   Fax:  [phone]  Name: WOODARD PERRELL MRN: [id number] Date of Birth: 1966/02/03

## 2017-04-15 NOTE — Therapy (Signed)
[place] MAIN [facility] SERVICES [address] [place], [place], [address] Phone: [phone]   Fax:  [phone]  Occupational Therapy Treatment  Patient Details  Name: Curtis Powell MRN: [id number] Date of Birth: 19-Sep-1965 Referring Provider (Historical): Dr. Bobette Mo   Encounter Date: 04/15/2017  OT End of Session - 04/15/17 1401    Visit Number  11    Number of Visits  24    Date for OT Re-Evaluation  05/01/17    OT Start Time  1330    OT Stop Time  1415    OT Time Calculation (min)  45 min    Activity Tolerance  Patient tolerated treatment well       Past Medical History:  Diagnosis Date  . GERD (gastroesophageal reflux disease)   . Hyperlipidemia   . Hypertension   . Obesity   . Stroke [facility])     Past Surgical History:  Procedure Laterality Date  . BRAIN SURGERY      There were no vitals filed for this visit.  Subjective Assessment - 04/15/17 1334    Subjective   Pt. reports he has no pain, and his right arm is feeling better.    Pertinent History  Pt. is a 52 y.o. male who suffered a CVA on 05/01/2016. Pt. was admitted to the hospital. Once discharged, he received Home Health PT and OT services for about a month. Pt. has had multiple CVAs over the past 8 years, and has had multiple falls in the past 6 months. Pt. resides in an apartment. Pt. has caregivers for 80 hours. Pt.'s mother stays with pt. at night, and assists with IADL tasks.      Currently in Pain?  No/denies    Pain Score  7     Pain Location  Shoulder    Pain Orientation  Right    Pain Descriptors / Indicators  Jabbing       OT TREATMENT    Neuro muscular re-education:  Pt. worked on grasping 1" resistive cubes alternating thumb opposition to the tip of the 2nd through 5th digits while the board is placed at a vertical angle. Pt. worked on pressing the cubes back into place while alternating isolated 2nd through 5th digit extension. Pt. Performed the task  with the right and left hand.  Therapeutic Exercise:  Pt. Tolerated PROM/AAROM for right shoulder flexion, extension, horizontal abduction/adduction. Pt. Worked on red theraband for left shoulder horizontal shoulder abduction, elbow flexion, and extension. Pt. performed gross gripping with grip strengthener. Pt. worked on sustaining grip while grasping pegs and reaching at various heights. Gripper was placed in the 2nd resistive slot for the right, and 3rd slot for the left with the white resistive spring. Pt. Tolerated well with no complaints of pain during the session.                       OT Education - 04/15/17 1400    Education provided  Yes    Education Details  ROM, ther. ex., [place]    Person(s) Educated  Patient    Methods  Explanation;Demonstration;Verbal cues    Comprehension  Verbalized understanding;Returned demonstration          OT Long Term Goals - 02/26/17 1352      OT LONG TERM GOAL #1   Title  Pt. will UE functioning to be able to perfrom toilet hygiene with minimal assistance.    Baseline  Pt. is unable  Time  12    Period  Weeks    Status  New      OT LONG TERM GOAL #2   Title  Pt. will complete self grooming with minA    Baseline  Pt. requires extensive assist    Time  12    Period  Weeks    Status  New      OT LONG TERM GOAL #3   Title  Pt. will perfrom self-feeding skills with modified indepndence    Baseline  Pt. has difficutly, and performs with increased spillage    Time  12    Period  Weeks    Status  New      OT LONG TERM GOAL #4   Title  Pt. will perform self dressing with minA and A/E as needed.    Baseline  Pt. requires extensive assist    Time  12    Period  Weeks    Status  New      OT LONG TERM GOAL #5   Title  Pt. will perfrom light home making tasks with minA    Baseline  Pt. requires extensive assistance    Time  12    Period  Weeks    Status  New      OT LONG TERM GOAL #6   Title  Pt. will write his  name efficiently with 100% legibility    Baseline  50% legibility with incresed time required    Time  12    Period  Weeks    Status  New            Plan - 04/15/17 1401    Clinical Impression Statement  Pt. reports he is still doing the theraputty exercises at home, and they sem to be helping. Pt. reports having right shoulder pain at rest initially, however reports that it feels better with ROM. Pt. is tolerating treatment well. Pt. continues to improve UE functioning for improved engagement in ADLs, and IADLs    Occupational performance deficits (Please refer to evaluation for details):  ADL's;IADL's    OT Frequency  2x / week    OT Duration  12 weeks    OT Treatment/Interventions  Self-care/ADL training;Patient/family education;DME and/or AE instruction;Therapeutic exercise;Moist Heat    Clinical Decision Making  Several treatment options, min-mod task modification necessary    Consulted and Agree with Plan of Care  Patient       Patient will benefit from skilled therapeutic intervention in order to improve the following deficits and impairments:  Pain, Decreased cognition, Decreased activity tolerance, Impaired UE functional use, Decreased range of motion, Decreased coordination, Decreased endurance  Visit Diagnosis: Muscle weakness (generalized)  Other lack of coordination    Problem List Patient Active Problem List   Diagnosis Date Noted  . Acute CVA (cerebrovascular accident) ([place]) 05/02/2016  . Left-sided weakness 05/01/2016    Harrel Carina, MS, OTR/L 04/15/2017, 3:07 PM  [place] MAIN [facility] SERVICES [address] [place], [place], [address] Phone: [phone]   Fax:  [phone]  Name: Curtis Powell MRN: [id number] Date of Birth: 02-10-1966

## 2017-04-17 ENCOUNTER — Ambulatory Visit: Payer: Medicare HMO

## 2017-04-17 DIAGNOSIS — R278 Other lack of coordination: Secondary | ICD-10-CM

## 2017-04-17 DIAGNOSIS — R2689 Other abnormalities of gait and mobility: Secondary | ICD-10-CM

## 2017-04-17 DIAGNOSIS — M6281 Muscle weakness (generalized): Secondary | ICD-10-CM

## 2017-04-17 DIAGNOSIS — I69351 Hemiplegia and hemiparesis following cerebral infarction affecting right dominant side: Secondary | ICD-10-CM

## 2017-04-17 NOTE — Therapy (Signed)
[place] MAIN [facility] SERVICES [address] [place], [place], [address] Phone: [phone]   Fax:  [phone]  Physical Therapy Treatment  Patient Details  Name: Curtis Powell MRN: [id number] Date of Birth: Sep 03, 1965 Referring Provider: Dr. Larinda Buttery   Encounter Date: 04/17/2017  PT End of Session - 04/17/17 1311    Visit Number  13    Number of Visits  32    Date for PT Re-Evaluation  05/28/17    Authorization - Visit Number  3    Authorization - Number of Visits  10    PT Start Time  1300    PT Stop Time  1345    PT Time Calculation (min)  45 min    Equipment Utilized During Treatment  Gait belt    Activity Tolerance  Patient tolerated treatment well;Treatment limited secondary to medical complications (Comment);Patient limited by fatigue    Behavior During Therapy  [facility] for tasks assessed/performed       Past Medical History:  Diagnosis Date  . GERD (gastroesophageal reflux disease)   . Hyperlipidemia   . Hypertension   . Obesity   . Stroke [facility])     Past Surgical History:  Procedure Laterality Date  . BRAIN SURGERY      There were no vitals filed for this visit.  Subjective Assessment - 04/17/17 1308    Subjective  Patient reports that R shoulder is feeling better. Reports walking every morning in the house, to kitchen and back. Reports caregiver occasionally forgets so he has to remind him.     Pertinent History   Patient is a pleasant 52 year old male who presents to physical therapy for weakness and immobility secondary to CVA.  Had a stroke in Feb 2018. He was previously fully independent, but this stroke caused severe residual deficits, mainly on the right side as well as speech, and now he is unable to walk or perform most of his ADLs on his own. Entire right side is very weak. He still has a little difficulty with speech but his swallowing is improved to baseline. His mom had to move in with him and now  is his main caregiver.     Limitations  Sitting;Lifting;Standing;Walking;House hold activities    How long can you sit comfortably?  5 minutes    How long can you stand comfortably?  5 minutes    How long can you walk comfortably?  10 ft    Patient Stated Goals  Walk without walker, walk further with walker, get some strength in back     Currently in Pain?  Yes    Pain Score  2     Pain Location  Knee    Pain Orientation  Right    Pain Descriptors / Indicators  Aching    Pain Type  Acute pain    Pain Onset  More than a month ago    Pain Frequency  Intermittent      1: oo Jamael TREATMENT: Warm up on Nustep level 1 x4 min; no UE just BLE due to R shoulder pain.  (unbilled);    WC to Nustep SPT transfer with RW and Min A. Good hand placement noted and initation of movement,no posterior LOB X 2 reps    WC to mat with SPT transfer and Min A for forward weight shift due to posterior tendency for trunk lean. X 2 reps.      Gait: Walking with RW and  CGA in hallway with cues for upright posture and WC follow. Ambulated 71 ft. Occasional LLE scuffing, step to pattern with occasionally leading with L and occasionally leading with R.  Played music while ambulating to encourage increased length of ambulating.    Seated strengthening: #5 ankle weight             LAQ 2x12x with 3 second hold at end range.              Seated marches 2x 15x, cues for upright posture and core activation                Seated abduction to step on PTs foot 2x 10x each leg       Pt. response to medical necessity:  Patient will continue to benefit from skilled physical therapy to improve strength, balance and gait safety.                          PT Education - 04/17/17 1310    Education provided  Yes    Education Details  ambulatory mechanics, LE strength    Person(s) Educated  Patient    Methods  Explanation;Demonstration;Verbal cues    Comprehension  Verbalized understanding;Returned  demonstration       PT Short Term Goals - 04/02/17 1608      PT SHORT TERM GOAL #1   Title  Patient will be independent in home exercise program to improve strength/mobility for better functional independence with ADLs.    Baseline  hep compliant    Time  2    Period  Weeks    Status  Partially Met      PT SHORT TERM GOAL #2   Title  Patient will require min cueing for STS transfer with CGA for increased independence with mobility.     Baseline  performs with CGA.     Time  2    Period  Weeks    Status  Achieved      PT SHORT TERM GOAL #3   Title  Patient will maintain upright posture for > 15 seconds to demonstrate strengthened postural control muscualture     Baseline  18 seconds     Time  2    Period  Weeks    Status  Achieved        PT Long Term Goals - 04/02/17 1610      PT LONG TERM GOAL #1   Title  Patient will perform bed mobility with Supervision to increase independent mobility    Baseline  Requires Min A for LE.     Time  8    Period  Weeks    Status  Partially Met      PT LONG TERM GOAL #2   Title  Patient (< 55 years old) will complete five times sit to stand test in < 10 seconds indicating an increased LE strength and improved balance.    Baseline  32 seconds from plinth with single UE on seat and single on walker.     Time  8    Period  Weeks    Status  Partially Met      PT LONG TERM GOAL #3   Title  Patient will ambulate 40 ft with walker with no breaks to allow for increased mobility within home.     Baseline  12/26: 29 ft with walker and WC follow with no rest breaks    Time  8    Period  Weeks    Status  Partially Met      PT LONG TERM GOAL #4   Title  Patient will increase BLE gross strength to 4+/5 as to improve functional strength for independent gait, increased standing tolerance and increased ADL ability.    Baseline  3+/5 to 4-/5 RLE    Time  8    Period  Weeks    Status  Partially Met      PT LONG TERM GOAL #5   Title  Patient  will increase lower extremity functional scale to >40/80 to demonstrate improved functional mobility and increased tolerance with ADLs.     Baseline  14/80; 12/26: 21/80    Time  8    Period  Weeks    Status  Partially Met      Additional Long Term Goals   Additional Long Term Goals  Yes      PT LONG TERM GOAL #6   Title  Patient will walk through a door involving one small step up/down to increase functional independence.     Baseline  unable to step up     Time  8    Period  Weeks    Status  New    Target Date  05/28/17            Plan - 04/17/17 1627    Clinical Impression Statement  Patient demonstrating increased gait capacity, ambulating 70 ft with CGA and RW. Decreased need for cueing of upright posture with use of song to encourage increased length of ambulation. Patient strength and coordination for sit to stands improving with decreased episodes of posterior LOB. Patient will continue to benefit from skilled physical therapy to improve strength, balance and gait safety.    Rehab Potential  Fair    Clinical Impairments Affecting Rehab Potential  hx of HTN, HLD, CVA, learning disability, Diabetes, brain tumor,     PT Frequency  2x / week    PT Duration  8 weeks    PT Treatment/Interventions  ADLs/Self Care Home Management;Aquatic Therapy;Ultrasound;Moist Heat;Traction;DME Instruction;Gait training;Stair training;Functional mobility training;Therapeutic activities;Therapeutic exercise;Orthotic Fit/Training;Neuromuscular re-education;Balance training;Patient/family education;Manual techniques;Wheelchair mobility training;Passive range of motion;Energy conservation;Taping;Visual/perceptual remediation/compensation    PT Next Visit Plan  walking in hallway with WC follow     PT Home Exercise Plan  see sheet    Consulted and Agree with Plan of Care  Patient;Family member/caregiver    Family Member Consulted  mother       Patient will benefit from skilled therapeutic  intervention in order to improve the following deficits and impairments:  Abnormal gait, Decreased activity tolerance, Decreased balance, Decreased knowledge of precautions, Decreased endurance, Decreased coordination, Decreased knowledge of use of DME, Decreased mobility, Decreased range of motion, Difficulty walking, Decreased safety awareness, Decreased strength, Impaired flexibility, Impaired perceived functional ability, Impaired tone, Postural dysfunction, Improper body mechanics, Pain  Visit Diagnosis: Muscle weakness (generalized)  Other lack of coordination  Other abnormalities of gait and mobility  Hemiplegia and hemiparesis following cerebral infarction affecting right dominant side [facility])     Problem List Patient Active Problem List   Diagnosis Date Noted  . Acute CVA (cerebrovascular accident) ([place]) 05/02/2016  . Left-sided weakness 05/01/2016   Janna Arch, PT, DPT   Janna Arch 04/17/2017, 4:27 PM  [place] MAIN [facility] SERVICES [address] [place], [place], [address] Phone: [phone]   Fax:  [phone]  Name: KORAY SOTER MRN: [id number]  Date of Birth: 05-Dec-1965

## 2017-04-22 ENCOUNTER — Ambulatory Visit: Payer: Medicare HMO | Admitting: Occupational Therapy

## 2017-04-22 ENCOUNTER — Ambulatory Visit: Payer: Medicare HMO

## 2017-04-22 ENCOUNTER — Encounter: Payer: Self-pay | Admitting: Occupational Therapy

## 2017-04-22 DIAGNOSIS — M6281 Muscle weakness (generalized): Secondary | ICD-10-CM

## 2017-04-22 DIAGNOSIS — I69351 Hemiplegia and hemiparesis following cerebral infarction affecting right dominant side: Secondary | ICD-10-CM

## 2017-04-22 DIAGNOSIS — R2689 Other abnormalities of gait and mobility: Secondary | ICD-10-CM

## 2017-04-22 DIAGNOSIS — R278 Other lack of coordination: Secondary | ICD-10-CM

## 2017-04-22 NOTE — Therapy (Signed)
[place] MAIN [facility] SERVICES [address] [place], [place], [address] Phone: [phone]   Fax:  [phone]  Occupational Therapy Treatment  Patient Details  Name: Curtis Powell MRN: [id number] Date of Birth: 1965/10/20 Referring Provider: Dr. Larinda Buttery   Encounter Date: 04/22/2017  OT End of Session - 04/22/17 1303    Visit Number  12    Number of Visits  24    Date for OT Re-Evaluation  05/01/17    OT Start Time  1300    OT Stop Time  1345    OT Time Calculation (min)  45 min    Activity Tolerance  Patient tolerated treatment well    Behavior During Therapy  [facility] for tasks assessed/performed       Past Medical History:  Diagnosis Date  . GERD (gastroesophageal reflux disease)   . Hyperlipidemia   . Hypertension   . Obesity   . Stroke [facility])     Past Surgical History:  Procedure Laterality Date  . BRAIN SURGERY     OT TREATMENT    Neuromuscular re-ed:  Pt. Worked on writing, and had difficulty with cursive writing. Printing was legible. Pt. Was provided with a pen gripper.  Therapeutic Exercise:   Pt. Tolerated PROM/AAROM slow gentle stretching for right shoulder flexion, extension, abduction, horizontal abduction, and adduction. Pt. performed gross gripping with grip strengthener. Pt. worked on sustaining grip while grasping pegs and reaching at various heights. Gripper was placed in the 2nd resistive slot for the left, and 1st for the right with the white resistive spring. Pt. Worked on pinch strengthening for lateral, and 3pt. pinch using yellow, red, and green resistive clips for the left, and yellow and red for the RUE without pain. Pt. worked on placing the clips at various vertical and horizontal angles. Tactile and verbal cues were required for eliciting the desired movement.                            OT Short Term Goals - 03/03/17 1459      OT SHORT TERM GOAL #1   Title  `         OT Long Term Goals - 02/26/17 1352      OT LONG TERM GOAL #1   Title  Pt. will UE functioning to be able to perfrom toilet hygiene with minimal assistance.    Baseline  Pt. is unable    Time  12    Period  Weeks    Status  New      OT LONG TERM GOAL #2   Title  Pt. will complete self grooming with minA    Baseline  Pt. requires extensive assist    Time  12    Period  Weeks    Status  New      OT LONG TERM GOAL #3   Title  Pt. will perfrom self-feeding skills with modified indepndence    Baseline  Pt. has difficutly, and performs with increased spillage    Time  12    Period  Weeks    Status  New      OT LONG TERM GOAL #4   Title  Pt. will perform self dressing with minA and A/E as needed.    Baseline  Pt. requires extensive assist    Time  12    Period  Weeks    Status  New  OT LONG TERM GOAL #5   Title  Pt. will perfrom light home making tasks with minA    Baseline  Pt. requires extensive assistance    Time  12    Period  Weeks    Status  New      OT LONG TERM GOAL #6   Title  Pt. will write his name efficiently with 100% legibility    Baseline  50% legibility with incresed time required    Time  12    Period  Weeks    Status  New            Plan - 04/22/17 1303    Clinical Impression Statement  Pt. reports that it is easier to open the refrigerator door at home. Pt. reports his right UE is feeling better. Pt. has intermittent right shoulder 5/10 pain with right shoulder flexion/extension. Pt. continues to work on improving RUE ROM, LUE strength, and bilateral hand coordination skills for improved engagement in ADL tasks, and IADLs,     Occupational performance deficits (Please refer to evaluation for details):  ADL's;IADL's    Rehab Potential  Fair    OT Frequency  2x / week    OT Duration  12 weeks    OT Treatment/Interventions  Self-care/ADL training;Patient/family education;DME and/or AE instruction;Therapeutic exercise;Moist Heat     Clinical Decision Making  Several treatment options, min-mod task modification necessary    Consulted and Agree with Plan of Care  Patient       Patient will benefit from skilled therapeutic intervention in order to improve the following deficits and impairments:  Pain, Decreased cognition, Decreased activity tolerance, Impaired UE functional use, Decreased range of motion, Decreased coordination, Decreased endurance  Visit Diagnosis: Muscle weakness (generalized)    Problem List Patient Active Problem List   Diagnosis Date Noted  . Acute CVA (cerebrovascular accident) ([place]) 05/02/2016  . Left-sided weakness 05/01/2016    Harrel Carina, MS, OTR/L 04/22/2017, 1:34 PM  [place] MAIN [facility] SERVICES [address] [place], [place], [address] Phone: [phone]   Fax:  [phone]  Name: Curtis Powell MRN: [id number] Date of Birth: 04-Dec-1965

## 2017-04-22 NOTE — Therapy (Signed)
[place] MAIN [facility] SERVICES [address] [place], [place], [address] Phone: [phone]   Fax:  [phone]  Physical Therapy Treatment  Patient Details  Name: Curtis Powell MRN: [id number] Date of Birth: 08-20-1965 Referring Provider: Dr. Larinda Buttery   Encounter Date: 04/22/2017  PT End of Session - 04/22/17 1353    Visit Number  14    Number of Visits  32    Date for PT Re-Evaluation  05/28/17    PT Start Time  6962    PT Stop Time  1430    PT Time Calculation (min)  43 min    Equipment Utilized During Treatment  Gait belt    Activity Tolerance  Patient tolerated treatment well;Treatment limited secondary to medical complications (Comment);Patient limited by fatigue    Behavior During Therapy  [facility] for tasks assessed/performed       Past Medical History:  Diagnosis Date  . GERD (gastroesophageal reflux disease)   . Hyperlipidemia   . Hypertension   . Obesity   . Stroke [facility])     Past Surgical History:  Procedure Laterality Date  . BRAIN SURGERY      There were no vitals filed for this visit.  Subjective Assessment - 04/22/17 1351    Subjective  Patient reports compliance with HEP, reports walking over the weekend in the house but not outside due to the weather.     Pertinent History   Patient is a pleasant 52 year old male who presents to physical therapy for weakness and immobility secondary to CVA.  Had a stroke in Feb 2018. He was previously fully independent, but this stroke caused severe residual deficits, mainly on the right side as well as speech, and now he is unable to walk or perform most of his ADLs on his own. Entire right side is very weak. He still has a little difficulty with speech but his swallowing is improved to baseline. His mom had to move in with him and now is his main caregiver.     Limitations  Sitting;Lifting;Standing;Walking;House hold activities    How long can you sit comfortably?  5  minutes    How long can you stand comfortably?  5 minutes    How long can you walk comfortably?  10 ft    Patient Stated Goals  Walk without walker, walk further with walker, get some strength in back     Currently in Pain?  No/denies         TREATMENT: Warm up on Nustep level 1 x4 min; no UE just BLE due to R shoulder pain;    WC to Nustep SPT transfer with RW and Min A. Good hand placement noted and initation of movement,no posterior LOB X 2 reps    WC to mat with SPT transfer and Min A for forward weight shift due to posterior tendency for trunk lean. X 2 reps.      Gait: Walking with RW and CGA in hallway with cues for upright posture and WC follow. Ambulated 95 ft. Occasional LLE scuffing, step to pattern with occasionally leading with L and occasionally leading with R.  Played music while ambulating to encourage increased length of ambulating.    Seated strengthening: #5 ankle weight             LAQ 2x12x with 3 second hold at end range.              Seated marches 2x 15x,  cues for upright posture and core activation             Seated abduction to step on PTs foot 2x 10x each leg        Pt. response to medical necessity:  Patient will continue to benefit from skilled physical therapy to improve strength, balance and gait safety.                      PT Education - 04/22/17 1352    Education provided  Yes    Education Details  ambulatory mechanics, LE strength     Person(s) Educated  Patient    Methods  Verbal cues;Explanation;Demonstration    Comprehension  Verbalized understanding;Returned demonstration;Verbal cues required       PT Short Term Goals - 04/02/17 1608      PT SHORT TERM GOAL #1   Title  Patient will be independent in home exercise program to improve strength/mobility for better functional independence with ADLs.    Baseline  hep compliant    Time  2    Period  Weeks    Status  Partially Met      PT SHORT TERM GOAL #2   Title   Patient will require min cueing for STS transfer with CGA for increased independence with mobility.     Baseline  performs with CGA.     Time  2    Period  Weeks    Status  Achieved      PT SHORT TERM GOAL #3   Title  Patient will maintain upright posture for > 15 seconds to demonstrate strengthened postural control muscualture     Baseline  18 seconds     Time  2    Period  Weeks    Status  Achieved        PT Long Term Goals - 04/02/17 1610      PT LONG TERM GOAL #1   Title  Patient will perform bed mobility with Supervision to increase independent mobility    Baseline  Requires Min A for LE.     Time  8    Period  Weeks    Status  Partially Met      PT LONG TERM GOAL #2   Title  Patient (< 46 years old) will complete five times sit to stand test in < 10 seconds indicating an increased LE strength and improved balance.    Baseline  32 seconds from plinth with single UE on seat and single on walker.     Time  8    Period  Weeks    Status  Partially Met      PT LONG TERM GOAL #3   Title  Patient will ambulate 40 ft with walker with no breaks to allow for increased mobility within home.     Baseline  12/26: 29 ft with walker and WC follow with no rest breaks    Time  8    Period  Weeks    Status  Partially Met      PT LONG TERM GOAL #4   Title  Patient will increase BLE gross strength to 4+/5 as to improve functional strength for independent gait, increased standing tolerance and increased ADL ability.    Baseline  3+/5 to 4-/5 RLE    Time  8    Period  Weeks    Status  Partially Met      PT LONG TERM GOAL #5  Title  Patient will increase lower extremity functional scale to >40/80 to demonstrate improved functional mobility and increased tolerance with ADLs.     Baseline  14/80; 12/26: 21/80    Time  8    Period  Weeks    Status  Partially Met      Additional Long Term Goals   Additional Long Term Goals  Yes      PT LONG TERM GOAL #6   Title  Patient will walk  through a door involving one small step up/down to increase functional independence.     Baseline  unable to step up     Time  8    Period  Weeks    Status  New    Target Date  05/28/17            Plan - 04/22/17 1421    Clinical Impression Statement  Patient progressing with functional ambulation increasing duration of ambulation to 95 ft with utilization of music for encouragement of progression and rhythm for step pacing.   Patient will continue to benefit from skilled physical therapy to improve strength, balance and gait safety.    Rehab Potential  Fair    Clinical Impairments Affecting Rehab Potential  hx of HTN, HLD, CVA, learning disability, Diabetes, brain tumor,     PT Frequency  2x / week    PT Duration  8 weeks    PT Treatment/Interventions  ADLs/Self Care Home Management;Aquatic Therapy;Ultrasound;Moist Heat;Traction;DME Instruction;Gait training;Stair training;Functional mobility training;Therapeutic activities;Therapeutic exercise;Orthotic Fit/Training;Neuromuscular re-education;Balance training;Patient/family education;Manual techniques;Wheelchair mobility training;Passive range of motion;Energy conservation;Taping;Visual/perceptual remediation/compensation    PT Next Visit Plan  walking in hallway with WC follow     PT Home Exercise Plan  see sheet    Consulted and Agree with Plan of Care  Patient;Family member/caregiver    Family Member Consulted  mother       Patient will benefit from skilled therapeutic intervention in order to improve the following deficits and impairments:  Abnormal gait, Decreased activity tolerance, Decreased balance, Decreased knowledge of precautions, Decreased endurance, Decreased coordination, Decreased knowledge of use of DME, Decreased mobility, Decreased range of motion, Difficulty walking, Decreased safety awareness, Decreased strength, Impaired flexibility, Impaired perceived functional ability, Impaired tone, Postural dysfunction,  Improper body mechanics, Pain  Visit Diagnosis: Muscle weakness (generalized)  Other lack of coordination  Other abnormalities of gait and mobility  Hemiplegia and hemiparesis following cerebral infarction affecting right dominant side [facility])     Problem List Patient Active Problem List   Diagnosis Date Noted  . Acute CVA (cerebrovascular accident) ([place]) 05/02/2016  . Left-sided weakness 05/01/2016   Janna Arch, PT, DPT   Janna Arch 04/22/2017, 5:10 PM  [place] MAIN [facility] SERVICES [address] [place], [place], [address] Phone: [phone]   Fax:  [phone]  Name: Curtis Powell MRN: [id number] Date of Birth: 1965/10/12

## 2017-04-24 ENCOUNTER — Encounter: Payer: Self-pay | Admitting: Occupational Therapy

## 2017-04-24 ENCOUNTER — Ambulatory Visit: Payer: Medicare HMO

## 2017-04-24 ENCOUNTER — Ambulatory Visit: Payer: Medicare HMO | Admitting: Occupational Therapy

## 2017-04-24 DIAGNOSIS — R2689 Other abnormalities of gait and mobility: Secondary | ICD-10-CM

## 2017-04-24 DIAGNOSIS — I69351 Hemiplegia and hemiparesis following cerebral infarction affecting right dominant side: Secondary | ICD-10-CM

## 2017-04-24 DIAGNOSIS — M6281 Muscle weakness (generalized): Secondary | ICD-10-CM

## 2017-04-24 DIAGNOSIS — R278 Other lack of coordination: Secondary | ICD-10-CM

## 2017-04-24 NOTE — Therapy (Signed)
[place] MAIN [facility] SERVICES [address] [place], [place], [address] Phone: [phone]   Fax:  [phone]  Physical Therapy Treatment  Patient Details  Name: Curtis Powell MRN: [id number] Date of Birth: 04/15/1965 Referring Provider: Dr. Larinda Buttery   Encounter Date: 04/24/2017  PT End of Session - 04/24/17 1359    Visit Number  15    Number of Visits  32    Date for PT Re-Evaluation  05/28/17    PT Start Time  3220    PT Stop Time  1430    PT Time Calculation (min)  45 min    Equipment Utilized During Treatment  Gait belt    Activity Tolerance  Patient tolerated treatment well;Treatment limited secondary to medical complications (Comment);Patient limited by fatigue    Behavior During Therapy  [facility] for tasks assessed/performed       Past Medical History:  Diagnosis Date  . GERD (gastroesophageal reflux disease)   . Hyperlipidemia   . Hypertension   . Obesity   . Stroke [facility]     Past Surgical History:  Procedure Laterality Date  . BRAIN SURGERY      There were no vitals filed for this visit.  Subjective Assessment - 04/24/17 1349    Subjective  Patient reports R knee pain, states the cold makes it worse, started this morning and still is painful.    Pertinent History   Patient is a pleasant 52 year old male who presents to physical therapy for weakness and immobility secondary to CVA.  Had a stroke in Feb 2018. He was previously fully independent, but this stroke caused severe residual deficits, mainly on the right side as well as speech, and now he is unable to walk or perform most of his ADLs on his own. Entire right side is very weak. He still has a little difficulty with speech but his swallowing is improved to baseline. His mom had to move in with him and now is his main caregiver.     Limitations  Sitting;Lifting;Standing;Walking;House hold activities    How long can you sit comfortably?  5 minutes    How  long can you stand comfortably?  5 minutes    How long can you walk comfortably?  10 ft    Patient Stated Goals  Walk without walker, walk further with walker, get some strength in back     Currently in Pain?  Yes    Pain Score  8     Pain Location  Knee    Pain Orientation  Right    Pain Descriptors / Indicators  Aching;Pressure    Pain Type  Acute pain    Pain Onset  Today    Pain Frequency  Intermittent         Seated strengthening: #5 ankle weight             LAQ 2x15x with 3 second hold at end range.              Seated marches 2x 15x, cues for upright posture and core activation             Seated abduction to step on PTs foot 2x 10x each leg    Seated adduction squeezes 2x 10x 5 second holds     Seated IT band stretch 60 seconds manual 2x BLE  Seated hamstring stretch Manual 60 seconds 2x BLE  Seated adduction with LAQ 2x 15   Pt. response  to medical necessity:  Patient will continue to benefit from skilled physical therapy to improve strength, balance and gait safety.                    PT Education - 04/24/17 1350    Education provided  Yes    Education Details  LE strength and mobility     Person(s) Educated  Patient    Methods  Explanation;Demonstration;Verbal cues    Comprehension  Verbalized understanding;Returned demonstration;Verbal cues required       PT Short Term Goals - 04/02/17 1608      PT SHORT TERM GOAL #1   Title  Patient will be independent in home exercise program to improve strength/mobility for better functional independence with ADLs.    Baseline  hep compliant    Time  2    Period  Weeks    Status  Partially Met      PT SHORT TERM GOAL #2   Title  Patient will require min cueing for STS transfer with CGA for increased independence with mobility.     Baseline  performs with CGA.     Time  2    Period  Weeks    Status  Achieved      PT SHORT TERM GOAL #3   Title  Patient will maintain upright posture for > 15  seconds to demonstrate strengthened postural control muscualture     Baseline  18 seconds     Time  2    Period  Weeks    Status  Achieved        PT Long Term Goals - 04/02/17 1610      PT LONG TERM GOAL #1   Title  Patient will perform bed mobility with Supervision to increase independent mobility    Baseline  Requires Min A for LE.     Time  8    Period  Weeks    Status  Partially Met      PT LONG TERM GOAL #2   Title  Patient (< 71 years old) will complete five times sit to stand test in < 10 seconds indicating an increased LE strength and improved balance.    Baseline  32 seconds from plinth with single UE on seat and single on walker.     Time  8    Period  Weeks    Status  Partially Met      PT LONG TERM GOAL #3   Title  Patient will ambulate 40 ft with walker with no breaks to allow for increased mobility within home.     Baseline  12/26: 29 ft with walker and WC follow with no rest breaks    Time  8    Period  Weeks    Status  Partially Met      PT LONG TERM GOAL #4   Title  Patient will increase BLE gross strength to 4+/5 as to improve functional strength for independent gait, increased standing tolerance and increased ADL ability.    Baseline  3+/5 to 4-/5 RLE    Time  8    Period  Weeks    Status  Partially Met      PT LONG TERM GOAL #5   Title  Patient will increase lower extremity functional scale to >40/80 to demonstrate improved functional mobility and increased tolerance with ADLs.     Baseline  14/80; 12/26: 21/80    Time  8  Period  Weeks    Status  Partially Met      Additional Long Term Goals   Additional Long Term Goals  Yes      PT LONG TERM GOAL #6   Title  Patient will walk through a door involving one small step up/down to increase functional independence.     Baseline  unable to step up     Time  8    Period  Weeks    Status  New    Target Date  05/28/17            Plan - 04/24/17 1406    Clinical Impression Statement   Patient unable to perform weight bearing interventions due to knee pain. Pain decreased with Lateral to medial glide of patella allowing improved alignment of patella gliding pattern.  Patient will continue to benefit from skilled physical therapy to improve strength, balance and gait safety.    Rehab Potential  Fair    Clinical Impairments Affecting Rehab Potential  hx of HTN, HLD, CVA, learning disability, Diabetes, brain tumor,     PT Frequency  2x / week    PT Duration  8 weeks    PT Treatment/Interventions  ADLs/Self Care Home Management;Aquatic Therapy;Ultrasound;Moist Heat;Traction;DME Instruction;Gait training;Stair training;Functional mobility training;Therapeutic activities;Therapeutic exercise;Orthotic Fit/Training;Neuromuscular re-education;Balance training;Patient/family education;Manual techniques;Wheelchair mobility training;Passive range of motion;Energy conservation;Taping;Visual/perceptual remediation/compensation    PT Next Visit Plan  walking in hallway with WC follow     PT Home Exercise Plan  see sheet    Consulted and Agree with Plan of Care  Patient;Family member/caregiver    Family Member Consulted  mother       Patient will benefit from skilled therapeutic intervention in order to improve the following deficits and impairments:  Abnormal gait, Decreased activity tolerance, Decreased balance, Decreased knowledge of precautions, Decreased endurance, Decreased coordination, Decreased knowledge of use of DME, Decreased mobility, Decreased range of motion, Difficulty walking, Decreased safety awareness, Decreased strength, Impaired flexibility, Impaired perceived functional ability, Impaired tone, Postural dysfunction, Improper body mechanics, Pain  Visit Diagnosis: Muscle weakness (generalized)  Other lack of coordination  Hemiplegia and hemiparesis following cerebral infarction affecting right dominant side (HCC)  Other abnormalities of gait and mobility     Problem  List Patient Active Problem List   Diagnosis Date Noted  . Acute CVA (cerebrovascular accident) ([place]) 05/02/2016  . Left-sided weakness 05/01/2016   Janna Arch, PT, DPT   Janna Arch 04/24/2017, 2:30 PM  [place] MAIN [facility] SERVICES [address] [place], [place], [address] Phone: [phone]   Fax:  [phone]  Name: Curtis Powell MRN: [id number] Date of Birth: 08/19/1965

## 2017-04-25 NOTE — Therapy (Signed)
[place] MAIN [facility] SERVICES [address] [place], [place], [address] Phone: [phone]   Fax:  [phone]  Occupational Therapy Treatment  Patient Details  Name: Curtis Powell MRN: [id number] Date of Birth: 05/29/1965 Referring Provider: Dr. Larinda Buttery   Encounter Date: 04/24/2017  OT End of Session - 04/24/17 1312    Visit Number  13    Number of Visits  24    Date for OT Re-Evaluation  05/01/17    OT Start Time  1301    OT Stop Time  1345    OT Time Calculation (min)  44 min       Past Medical History:  Diagnosis Date  . GERD (gastroesophageal reflux disease)   . Hyperlipidemia   . Hypertension   . Obesity   . Stroke [facility])     Past Surgical History:  Procedure Laterality Date  . BRAIN SURGERY      There were no vitals filed for this visit.  Subjective Assessment - 04/24/17 1307    Subjective   Patient reports his right leg has been hurting some, thinks its due to the weather.  Reports his right arm is not hurting but it takes more effort to use. Patient is right handed and would like to work on handwriting this date.     Pertinent History  Pt. is a 52 y.o. male who suffered a CVA on 05/01/2016. Pt. was admitted to the hospital. Once discharged, he received Home Health PT and OT services for about a month. Pt. has had multiple CVAs over the past 8 years, and has had multiple falls in the past 6 months. Pt. resides in an apartment. Pt. has caregivers for 80 hours. Pt.'s mother stays with pt. at night, and assists with IADL tasks.      Patient Stated Goals  To be able to throw a ball,a nd dribble a ball.    Pain Score  5     Pain Location  Leg    Pain Orientation  Right    Pain Descriptors / Indicators  Aching                   OT Treatments/Exercises (OP) - 04/25/17 1712      ADLs   ADL Comments  Patient seen for handwriting skills with name and address in print and script writing with built up  pencil.  Cues for grip and letter formation, legibility at 50% in print and less than 50 with script.       Neurological Re-education Exercises   Other Exercises 1  Patient seen for ROM, reaching exercises this date with right UE able to only complete level one of shape tower, with LUE patient was able to complete up to the 3rd level of reaching for tower with cues for technique and weight shift to place items onto tower.  Resistive putty exercises for finger strengthening on right and left hands, cues for pinching and prehension patterns.       Fine Motor Coordination (Hand/Wrist)   Other Fine Motor Exercises  Patient performing knotting/unknotting with medium nylon rope for multiple repetitions with use of bilateral UEs for tasks completion.  Cues to use right hand as the lead for the task.              OT Education - 04/24/17 1312    Education provided  Yes    Education Details  handwriting skills, reaching, weight shift  Person(s) Educated  Patient    Methods  Explanation;Demonstration;Verbal cues    Comprehension  Verbal cues required;Returned demonstration;Verbalized understanding       OT Short Term Goals - 03/03/17 1459      OT SHORT TERM GOAL #1   Title  `        OT Long Term Goals - 02/26/17 1352      OT LONG TERM GOAL #1   Title  Pt. will UE functioning to be able to perfrom toilet hygiene with minimal assistance.    Baseline  Pt. is unable    Time  12    Period  Weeks    Status  New      OT LONG TERM GOAL #2   Title  Pt. will complete self grooming with minA    Baseline  Pt. requires extensive assist    Time  12    Period  Weeks    Status  New      OT LONG TERM GOAL #3   Title  Pt. will perfrom self-feeding skills with modified indepndence    Baseline  Pt. has difficutly, and performs with increased spillage    Time  12    Period  Weeks    Status  New      OT LONG TERM GOAL #4   Title  Pt. will perform self dressing with minA and A/E as needed.     Baseline  Pt. requires extensive assist    Time  12    Period  Weeks    Status  New      OT LONG TERM GOAL #5   Title  Pt. will perfrom light home making tasks with minA    Baseline  Pt. requires extensive assistance    Time  12    Period  Weeks    Status  New      OT LONG TERM GOAL #6   Title  Pt. will write his name efficiently with 100% legibility    Baseline  50% legibility with incresed time required    Time  12    Period  Weeks    Status  New            Plan - 04/24/17 1313    Clinical Impression Statement  Patient denied pain in UE this date but was limited with ROM using shape tower with right UE only able to complete first level.  He was able to complete up to the third level with use of left UE.  Patient requested to work on Engineer, production this date decreased legibility with script writing but improved with printing up to 50% legible. Patient continues to work towards skills to improve UE function.     Occupational performance deficits (Please refer to evaluation for details):  ADL's;IADL's    Rehab Potential  Fair    OT Frequency  2x / week    OT Duration  12 weeks    OT Treatment/Interventions  Self-care/ADL training;Patient/family education;DME and/or AE instruction;Therapeutic exercise;Moist Heat    Consulted and Agree with Plan of Care  Patient       Patient will benefit from skilled therapeutic intervention in order to improve the following deficits and impairments:  Pain, Decreased cognition, Decreased activity tolerance, Impaired UE functional use, Decreased range of motion, Decreased coordination, Decreased endurance  Visit Diagnosis: Muscle weakness (generalized)  Other lack of coordination  Hemiplegia and hemiparesis following cerebral infarction affecting right dominant side (HCC)  Problem List Patient Active Problem List   Diagnosis Date Noted  . Acute CVA (cerebrovascular accident) ([place]) 05/02/2016  . Left-sided weakness 05/01/2016    Achilles Dunk, OTR/L, CLT  Lovett,Amy 04/25/2017, 5:19 PM  [place] MAIN [facility] SERVICES [address] [place], [place], [address] Phone: [phone]   Fax:  [phone]  Name: EAGLE PITTA MRN: [id number] Date of Birth: 1965-06-26

## 2017-04-28 ENCOUNTER — Ambulatory Visit: Payer: Medicare HMO

## 2017-04-28 ENCOUNTER — Ambulatory Visit: Payer: Medicare HMO | Admitting: Occupational Therapy

## 2017-04-28 ENCOUNTER — Emergency Department: Payer: Medicare HMO

## 2017-04-28 ENCOUNTER — Emergency Department
Admission: EM | Admit: 2017-04-28 | Discharge: 2017-04-28 | Disposition: A | Discharge status: Home / Self Care | Payer: Medicare HMO | Attending: Emergency Medicine | Admitting: Emergency Medicine

## 2017-04-28 DIAGNOSIS — R102 Pelvic and perineal pain: Secondary | ICD-10-CM | POA: Diagnosis present

## 2017-04-28 DIAGNOSIS — I10 Essential (primary) hypertension: Secondary | ICD-10-CM | POA: Diagnosis not present

## 2017-04-28 DIAGNOSIS — Z79899 Other long term (current) drug therapy: Secondary | ICD-10-CM | POA: Insufficient documentation

## 2017-04-28 DIAGNOSIS — Z7902 Long term (current) use of antithrombotics/antiplatelets: Secondary | ICD-10-CM | POA: Diagnosis not present

## 2017-04-28 DIAGNOSIS — K5900 Constipation, unspecified: Secondary | ICD-10-CM | POA: Insufficient documentation

## 2017-04-28 LAB — URINALYSIS, COMPLETE (UACMP) WITH MICROSCOPIC
Bacteria, UA: NONE SEEN
Bilirubin Urine: NEGATIVE
Glucose, UA: NEGATIVE mg/dL
Hgb urine dipstick: NEGATIVE
Ketones, ur: NEGATIVE mg/dL
Leukocytes, UA: NEGATIVE
Nitrite: NEGATIVE
Protein, ur: NEGATIVE mg/dL
Specific Gravity, Urine: 1.014 (ref 1.005–1.030)
Squamous Epithelial / LPF: NONE SEEN
WBC, UA: NONE SEEN WBC/hpf (ref 0–5)
pH: 8 (ref 5.0–8.0)

## 2017-04-28 LAB — CBC WITH DIFFERENTIAL/PLATELET
Basophils Absolute: 0 10*3/uL (ref 0–0.1)
Basophils Relative: 1 %
Eosinophils Absolute: 0.1 10*3/uL (ref 0–0.7)
Eosinophils Relative: 2 %
HCT: 48.9 % (ref 40.0–52.0)
Hemoglobin: 15.8 g/dL (ref 13.0–18.0)
Lymphocytes Relative: 26 %
Lymphs Abs: 1.9 10*3/uL (ref 1.0–3.6)
MCH: 26.4 pg (ref 26.0–34.0)
MCHC: 32.3 g/dL (ref 32.0–36.0)
MCV: 81.8 fL (ref 80.0–100.0)
Monocytes Absolute: 0.4 10*3/uL (ref 0.2–1.0)
Monocytes Relative: 6 %
Neutro Abs: 4.7 10*3/uL (ref 1.4–6.5)
Neutrophils Relative %: 65 %
Platelets: 221 10*3/uL (ref 150–440)
RBC: 5.97 MIL/uL — ABNORMAL HIGH (ref 4.40–5.90)
RDW: 16.5 % — ABNORMAL HIGH (ref 11.5–14.5)
WBC: 7.1 10*3/uL (ref 3.8–10.6)

## 2017-04-28 LAB — COMPREHENSIVE METABOLIC PANEL
ALT: 31 U/L (ref 17–63)
AST: 25 U/L (ref 15–41)
Albumin: 4.6 g/dL (ref 3.5–5.0)
Alkaline Phosphatase: 76 U/L (ref 38–126)
Anion gap: 8 (ref 5–15)
BUN: 9 mg/dL (ref 6–20)
CO2: 28 mmol/L (ref 22–32)
Calcium: 9.8 mg/dL (ref 8.9–10.3)
Chloride: 100 mmol/L — ABNORMAL LOW (ref 101–111)
Creatinine, Ser: 0.96 mg/dL (ref 0.61–1.24)
GFR calc Af Amer: >60 mL/min (ref >60)
GFR calc non Af Amer: >60 mL/min (ref >60)
Glucose, Bld: 92 mg/dL (ref 65–99)
Potassium: 4.1 mmol/L (ref 3.5–5.1)
Sodium: 136 mmol/L (ref 135–145)
Total Bilirubin: 0.8 mg/dL (ref 0.3–1.2)
Total Protein: 8.4 g/dL — ABNORMAL HIGH (ref 6.5–8.1)

## 2017-04-28 LAB — LACTIC ACID, PLASMA: Lactic Acid, Venous: 1 mmol/L (ref 0.5–1.9)

## 2017-04-28 MED ORDER — SORBITOL 70 % SOLN
240.0000 mL | Freq: Once | Status: AC
  Administered 2017-04-28: 240 mL
  Filled 2017-04-28: qty 473

## 2017-04-28 MED ORDER — POLYETHYLENE GLYCOL 3350 17 G PO PACK: 17.0000 g | PACK | Freq: Every day | ORAL | 0 refills | Status: AC

## 2017-04-28 MED ORDER — IOPAMIDOL (ISOVUE-300) INJECTION 61%
30.0000 mL | Freq: Once | INTRAVENOUS | Status: AC | PRN
  Administered 2017-04-28: 30 mL via ORAL

## 2017-04-28 MED ORDER — MAGNESIUM HYDROXIDE 400 MG/5ML PO SUSP
240.0000 mL | Freq: Once | ORAL | Status: AC
  Administered 2017-04-28: 240 mL
  Filled 2017-04-28: qty 355

## 2017-04-28 MED ORDER — SORBITOL 70 % SOLN
960.0000 mL | TOPICAL_OIL | Freq: Once | ORAL | Status: DC
  Filled 2017-04-28: qty 473

## 2017-04-28 MED ORDER — GLYCERIN LIQD
240.0000 mL | Freq: Once | Status: AC
  Administered 2017-04-28: 240 mL via RECTAL
  Filled 2017-04-28: qty 240

## 2017-04-28 MED ORDER — IOPAMIDOL (ISOVUE-300) INJECTION 61%
100.0000 mL | Freq: Once | INTRAVENOUS | Status: AC | PRN
  Administered 2017-04-28: 100 mL via INTRAVENOUS

## 2017-04-28 NOTE — ED Notes (Signed)
PT has had multiple BMs and reports decreased abd pain at this time. Small amount of bright red blood noted with stool. Pt has hemorrhoids noted and blood appears to be from trauma to hemmorroids during enema. Bleeding has stopped now that pt is no longer having BMs. Pt denies increased pain around Hemorrhoid.

## 2017-04-28 NOTE — ED Provider Notes (Signed)
[facility] Emergency Department Provider Note   ____________________________________________   First MD Initiated Contact with Patient 04/28/17 [id number]     (approximate)  I have reviewed the triage vital signs and the nursing notes.   HISTORY  Chief Complaint Pelvic Pain    HPI Isamar Curtis Powell is a 52 y.o. male Patient reports 3 days of sharp pelvic pain. He has not stooled in 3-1/2 days. He usually stools every 3 days or so. Urinating Slightly more than usual. He has no abdominal pain he is not vomiting is no diarrhea   Past Medical History:  Diagnosis Date  . GERD (gastroesophageal reflux disease)   . Hyperlipidemia   . Hypertension   . Obesity   . Stroke [facility])     Patient Active Problem List   Diagnosis Date Noted  . Acute CVA (cerebrovascular accident) ([place]) 05/02/2016  . Left-sided weakness 05/01/2016    Past Surgical History:  Procedure Laterality Date  . BRAIN SURGERY      Prior to Admission medications   Medication Sig Start Date End Date Taking? Authorizing Provider  atorvastatin (LIPITOR) 20 MG tablet Take 1 tablet by mouth daily. 04/11/17  Yes [provider]  clopidogrel (PLAVIX) 75 MG tablet Take 1 tablet (75 mg total) by mouth daily. 05/03/16  Yes Vaughan Basta, MD  multivitamin-iron-minerals-folic acid (CENTRUM) chewable tablet Chew 1 tablet by mouth daily.   Yes [provider]  omeprazole (PRILOSEC) 20 MG capsule Take 20 mg by mouth daily. 01/09/15  Yes [provider]  cyclobenzaprine (FLEXERIL) 10 MG tablet Take 1 tablet (10 mg total) by mouth every 8 (eight) hours as needed for muscle spasms. Patient taking differently: Take 5 mg by mouth daily as needed for muscle spasms.  01/03/15   Sable Feil, PA-C  senna-docusate (SENOKOT-S) 8.6-50 MG tablet Take 1 tablet by mouth at bedtime as needed for mild constipation. 05/03/16   Vaughan Basta, MD  traMADol (ULTRAM) 50 MG tablet Take 1  tablet (50 mg total) by mouth every 6 (six) hours as needed for moderate pain. Patient not taking: Reported on 05/01/2016 01/03/15   Sable Feil, PA-C    Allergies Patient has no known allergies.  Family History  Problem Relation Age of Onset  . Stroke Mother   . Hypertension Mother     Social History Social History   Tobacco Use  . Smoking status: Never Smoker  . Smokeless tobacco: Never Used  Substance Use Topics  . Alcohol use: No  . Drug use: No    Review of Systems  Constitutional: No fever/chills Eyes: No visual changes. ENT: No sore throat. Cardiovascular: Denies chest pain. Respiratory: Denies shortness of breath. Gastrointestinal:  abdominal pain.  No nausea, no vomiting.  No diarrhea.  No constipation. Genitourinary: Negative for dysuria. Musculoskeletal: Negative for back pain. Skin: Negative for rash. Neurological: Negative for new headaches, focal weakness   ____________________________________________   PHYSICAL EXAM:  VITAL SIGNS: ED Triage Vitals [04/28/17 0708]  Enc Vitals Group     BP (!) 145/80     Pulse Rate 79     Resp 15     Temp 98.3 F (36.8 C)     Temp Source Oral     SpO2 (!) 78 %     Weight 185 lb 8 oz (84.1 kg)     Height 4\' 11"  (1.499 m)     Head Circumference      Peak Flow      Pain  Score 9     Pain Loc      Pain Edu?      Excl. in [place]?     Constitutional: Alert and oriented. Well appearing and in no acute distress. Eyes: Conjunctivae are normal.  Head: Atraumatic. Nose: No congestion/rhinnorhea. Mouth/Throat: Mucous membranes are moist.  Oropharynx non-erythematous. Neck: No stridor.   Cardiovascular: Normal rate, regular rhythm. Grossly normal heart sounds.  Good peripheral circulation. Respiratory: Normal respiratory effort.  No retractions. Lungs CTAB. Gastrointestinal: Soft and nontender. No distention. No abdominal bruits. No CVA tenderness. Rectal: no external hemorrhoids no internal masses there is very  little stool in the rectal vault what is there is Hemoccult negative  Musculoskeletal: No lower extremity tenderness nor edema.  No joint effusions. Neurologic:  Normal speech and language.  Skin:  Skin is warm, dry and intact. No rash noted.   ____________________________________________   LABS (all labs ordered are listed, but only abnormal results are displayed)  Labs Reviewed  COMPREHENSIVE METABOLIC PANEL - Abnormal; Notable for the following components:      Result Value   Chloride 100 (*)    Total Protein 8.4 (*)    All other components within normal limits  URINALYSIS, COMPLETE (UACMP) WITH MICROSCOPIC - Abnormal; Notable for the following components:   Color, Urine YELLOW (*)    APPearance CLEAR (*)    All other components within normal limits  CBC WITH DIFFERENTIAL/PLATELET - Abnormal; Notable for the following components:   RBC 5.97 (*)    RDW 16.5 (*)    All other components within normal limits  LACTIC ACID, PLASMA   ____________________________________________  EKG   ____________________________________________  RADIOLOGY   ____________________________________________   PROCEDURES  Procedure(s) performed:   Procedures  Critical Care performed:   ____________________________________________   INITIAL IMPRESSION / ASSESSMENT AND PLAN / ED COURSE   patient's CT and labs are essentially okay except for constipation we will give him an enema and plan on discharging him if he has good result.  Clinical Course as of Apr 29 1535  Mon Apr 28, 2017  1409 pH: 8.0 [PM]    Clinical Course User Index [PM] Nena Polio, MD     ____________________________________________   FINAL CLINICAL IMPRESSION(S) / ED DIAGNOSES  Final diagnoses:  Constipation, unspecified constipation type     ED Discharge Orders    None       Note:  This document was prepared using Dragon voice recognition software and may include unintentional dictation  errors.    Nena Polio, MD 04/28/17 1537

## 2017-04-28 NOTE — ED Notes (Signed)
Lab contacted to send Phlebotomist for blood draw on patient after multiple unsuccessful attempts

## 2017-04-28 NOTE — ED Notes (Signed)
Patient taken off bedpan and cleaned with brief change

## 2017-04-28 NOTE — ED Triage Notes (Signed)
Patient coming from home via [facility].  Patient c/o pelvic pain X 3 days described as sharp. Patient denies urinary changes. Patient's urinated immediately prior to EMS arrival.  Per EMS, urine clear in appearance, no odor noted.   Patient last BM 3 days ago. Patient reports he normally has BMs every 3 days. Patient is not tender to palpation of abdomen.   Patient denies N/V/D, fever, chills.  Patient has hx of stroke. Patient normally wheelchair bound due to weakness of extremities.

## 2017-04-28 NOTE — Discharge Instructions (Addendum)
you can try MiraLAX which pharmacy should be able to get you to help with the constipation. use  it as directed. Follow-up with your regular doctor. please return for worse pain fever vomiting or feeling sicker.

## 2017-04-28 NOTE — ED Notes (Signed)
Patient transported to CT 

## 2017-04-28 NOTE — Progress Notes (Signed)
Chaplain was rounding and saw mother in Pt's room. Chaplain stopped by for ministry of presence. Chaplain talked with her and prayed for Pt and family. Mother was appreciative.    04/28/17 1300  Clinical Encounter Type  Visited With Family  Visit Type Initial;Spiritual support  Spiritual Encounters  Spiritual Needs Prayer;Emotional  Stress Factors  Family Stress Factors None identified

## 2017-04-30 ENCOUNTER — Ambulatory Visit: Payer: Medicare HMO | Admitting: Occupational Therapy

## 2017-04-30 ENCOUNTER — Ambulatory Visit: Payer: Medicare HMO

## 2017-05-05 ENCOUNTER — Ambulatory Visit: Payer: Medicare HMO

## 2017-05-05 ENCOUNTER — Other Ambulatory Visit: Payer: Self-pay

## 2017-05-05 ENCOUNTER — Ambulatory Visit: Payer: Medicare HMO | Admitting: Occupational Therapy

## 2017-05-05 DIAGNOSIS — R2689 Other abnormalities of gait and mobility: Secondary | ICD-10-CM

## 2017-05-05 DIAGNOSIS — R278 Other lack of coordination: Secondary | ICD-10-CM

## 2017-05-05 DIAGNOSIS — I69351 Hemiplegia and hemiparesis following cerebral infarction affecting right dominant side: Secondary | ICD-10-CM

## 2017-05-05 DIAGNOSIS — M6281 Muscle weakness (generalized): Secondary | ICD-10-CM

## 2017-05-05 NOTE — Therapy (Signed)
[place] MAIN [facility] SERVICES [address] [place], [place], [address] Phone: [phone]   Fax:  [phone]  Physical Therapy Treatment  Patient Details  Name: Curtis Powell MRN: [id number] Date of Birth: [birth year] Referring Provider: Dr. Larinda Buttery   Encounter Date: 05/05/2017  PT End of Session - 05/05/17 1533    PT Start Time  1355    PT Stop Time  1431    PT Time Calculation (min)  36 min    Equipment Utilized During Treatment  Gait belt    Activity Tolerance  Patient tolerated treatment well;Patient limited by fatigue       Past Medical History:  Diagnosis Date  . GERD (gastroesophageal reflux disease)   . Hyperlipidemia   . Hypertension   . Obesity   . Stroke [facility])     Past Surgical History:  Procedure Laterality Date  . BRAIN SURGERY      There were no vitals filed for this visit.  Subjective Assessment - 05/05/17 1530    Subjective  Pt denies pain today; reports no difficulty with R knee the past week. No other voiced complaints.     Pertinent History   Patient is a pleasant 52 year old male who presents to physical therapy for weakness and immobility secondary to CVA.  Had a stroke in Feb 2018. He was previously fully independent, but this stroke caused severe residual deficits, mainly on the right side as well as speech, and now he is unable to walk or perform most of his ADLs on his own. Entire right side is very weak. He still has a little difficulty with speech but his swallowing is improved to baseline. His mom had to move in with him and now is his main caregiver.       Stand hip exercises in parallel bars, B 10x each per round; 2 rounds with 1 seated rest break in between - Stand same leg march - SLR - hip abd - hip ext   Seated green T band exercises, B 10x each per round, 2 rounds - hamstrings curl - hip abd - knee extension  * focus on slow negative with T band  exercises                        PT Education - 05/05/17 1530    Education provided  Yes    Education Details  Standing and seated LE strength. Upright posture with exercise. Attention to the negative control with T band seated exercises.     Person(s) Educated  Patient       PT Short Term Goals - 04/02/17 1608      PT SHORT TERM GOAL #1   Title  Patient will be independent in home exercise program to improve strength/mobility for better functional independence with ADLs.    Baseline  hep compliant    Time  2    Period  Weeks    Status  Partially Met      PT SHORT TERM GOAL #2   Title  Patient will require min cueing for STS transfer with CGA for increased independence with mobility.     Baseline  performs with CGA.     Time  2    Period  Weeks    Status  Achieved      PT SHORT TERM GOAL #3   Title  Patient will maintain upright posture for > 15 seconds to demonstrate  strengthened postural control muscualture     Baseline  18 seconds     Time  2    Period  Weeks    Status  Achieved        PT Long Term Goals - 04/02/17 1610      PT LONG TERM GOAL #1   Title  Patient will perform bed mobility with Supervision to increase independent mobility    Baseline  Requires Min A for LE.     Time  8    Period  Weeks    Status  Partially Met      PT LONG TERM GOAL #2   Title  Patient (< 73 years old) will complete five times sit to stand test in < 10 seconds indicating an increased LE strength and improved balance.    Baseline  32 seconds from plinth with single UE on seat and single on walker.     Time  8    Period  Weeks    Status  Partially Met      PT LONG TERM GOAL #3   Title  Patient will ambulate 40 ft with walker with no breaks to allow for increased mobility within home.     Baseline  12/26: 29 ft with walker and WC follow with no rest breaks    Time  8    Period  Weeks    Status  Partially Met      PT LONG TERM GOAL #4   Title  Patient will  increase BLE gross strength to 4+/5 as to improve functional strength for independent gait, increased standing tolerance and increased ADL ability.    Baseline  3+/5 to 4-/5 RLE    Time  8    Period  Weeks    Status  Partially Met      PT LONG TERM GOAL #5   Title  Patient will increase lower extremity functional scale to >40/80 to demonstrate improved functional mobility and increased tolerance with ADLs.     Baseline  14/80; 12/26: 21/80    Time  8    Period  Weeks    Status  Partially Met      Additional Long Term Goals   Additional Long Term Goals  Yes      PT LONG TERM GOAL #6   Title  Patient will walk through a door involving one small step up/down to increase functional independence.     Baseline  unable to step up     Time  8    Period  Weeks    Status  New    Target Date  05/28/17            Plan - 05/05/17 1534    Clinical Impression Statement  Pt tolerated stand hip strengthening exercises with 1 seated rest break between rounds. Fatigued post 2 rounds with noteable decreasing upright posture and difficulty maintaining R knee extension with R weight bearing. Pt able to tolerated seated LE exercises post stand exercises well. Able to demonstrate good negative control post instruction.     Rehab Potential  Fair    Clinical Impairments Affecting Rehab Potential  hx of HTN, HLD, CVA, learning disability, Diabetes, brain tumor,     PT Frequency  2x / week    PT Duration  8 weeks    PT Treatment/Interventions  ADLs/Self Care Home Management;Aquatic Therapy;Ultrasound;Moist Heat;Traction;DME Instruction;Gait training;Stair training;Functional mobility training;Therapeutic activities;Therapeutic exercise;Orthotic Fit/Training;Neuromuscular re-education;Balance training;Patient/family education;Manual techniques;Wheelchair mobility training;Passive range of  motion;Energy conservation;Taping;Visual/perceptual remediation/compensation    PT Next Visit Plan  walking in hallway  with WC follow     PT Home Exercise Plan  see sheet    Consulted and Agree with Plan of Care  Patient;Family member/caregiver    Family Member Consulted  mother       Patient will benefit from skilled therapeutic intervention in order to improve the following deficits and impairments:  Abnormal gait, Decreased activity tolerance, Decreased balance, Decreased knowledge of precautions, Decreased endurance, Decreased coordination, Decreased knowledge of use of DME, Decreased mobility, Decreased range of motion, Difficulty walking, Decreased safety awareness, Decreased strength, Impaired flexibility, Impaired perceived functional ability, Impaired tone, Postural dysfunction, Improper body mechanics, Pain  Visit Diagnosis: Muscle weakness (generalized)  Other lack of coordination  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 Acute CVA (cerebrovascular accident) ([place]) 05/02/2016  . Left-sided weakness 05/01/2016    Larae Grooms, PTA 05/05/2017, 3:38 PM  [place] MAIN [facility] SERVICES [address] [place], [place], [address] Phone: [phone]   Fax:  [phone]  Name: KEATH MATERA MRN: [id number] Date of Birth: 1965/07/18

## 2017-05-07 ENCOUNTER — Ambulatory Visit: Payer: Medicare HMO | Admitting: Occupational Therapy

## 2017-05-07 ENCOUNTER — Encounter: Payer: Self-pay | Admitting: Occupational Therapy

## 2017-05-07 ENCOUNTER — Ambulatory Visit: Payer: Medicare HMO

## 2017-05-07 DIAGNOSIS — M6281 Muscle weakness (generalized): Secondary | ICD-10-CM

## 2017-05-07 DIAGNOSIS — I69351 Hemiplegia and hemiparesis following cerebral infarction affecting right dominant side: Secondary | ICD-10-CM

## 2017-05-07 DIAGNOSIS — R2689 Other abnormalities of gait and mobility: Secondary | ICD-10-CM

## 2017-05-07 DIAGNOSIS — R278 Other lack of coordination: Secondary | ICD-10-CM

## 2017-05-07 NOTE — Addendum Note (Signed)
Addended by: Lucia Bitter on: 05/07/2017 05:10 PM   Modules accepted: Orders

## 2017-05-07 NOTE — Therapy (Signed)
[place] MAIN [facility] SERVICES [address] [place], [place], [address] Phone: [phone]   Fax:  [phone]  Physical Therapy Treatment  Patient Details  Name: Curtis Powell MRN: [id number] Date of Birth: 06-09-1965 Referring Provider: Dr. Larinda Buttery   Encounter Date: 05/07/2017  PT End of Session - 05/07/17 1307    Visit Number  17    Number of Visits  32    Date for PT Re-Evaluation  05/28/17    PT Start Time  3295    PT Stop Time  1345    PT Time Calculation (min)  46 min    Equipment Utilized During Treatment  Gait belt    Activity Tolerance  Patient tolerated treatment well    Behavior During Therapy  [facility] for tasks assessed/performed       Past Medical History:  Diagnosis Date  . GERD (gastroesophageal reflux disease)   . Hyperlipidemia   . Hypertension   . Obesity   . Stroke [facility])     Past Surgical History:  Procedure Laterality Date  . BRAIN SURGERY      There were no vitals filed for this visit.  Subjective Assessment - 05/07/17 1306    Subjective  Patient reports no pain today. Reports compliance with HEP and walking when at home. Had a bad night last night, felt achy.     Pertinent History   Patient is a pleasant 52 year old male who presents to physical therapy for weakness and immobility secondary to CVA.  Had a stroke in Feb 2018. He was previously fully independent, but this stroke caused severe residual deficits, mainly on the right side as well as speech, and now he is unable to walk or perform most of his ADLs on his own. Entire right side is very weak. He still has a little difficulty with speech but his swallowing is improved to baseline. His mom had to move in with him and now is his main caregiver.     Currently in Pain?  No/denies       TREATMENT: Warm up on Nustep level 4 x4 min; no UE just BLE due to R shoulder pain;    WC to Nustep SPT transfer with RW and Min A. Good hand placement  noted and initation of movement,no posterior LOB X 2 reps    WC to mat with SPT transfer and Min A for forward weight shift due to posterior tendency for trunk lean. X 2 reps.      Gait: Walking with RW and CGA in hallway with cues for upright posture and WC follow. Ambulated 105 ft. Occasional LLE scuffing, step to pattern with occasionally leading with L and occasionally leading with R.  Played music while ambulating to encourage increased length of ambulating.    Seated strengthening: #5 ankle weight             Seated marches 2x 15x, cues for upright posture and core activation              Bathroom mobility, STS, prolonged standing duration. Cues for decreasing trunk flexion due to fatigue, able to maintain upright posture with decreased need for UE support.    Pt. response to medical necessity:  Patient will continue to benefit from skilled physical therapy to improve strength, balance and gait safety.                        PT Education -  05/07/17 1307    Education provided  Yes    Education Details  ambulatory mechanics, functional strength and balance.     Person(s) Educated  Patient    Methods  Explanation;Demonstration;Verbal cues    Comprehension  Verbalized understanding;Returned demonstration       PT Short Term Goals - 04/02/17 1608      PT SHORT TERM GOAL #1   Title  Patient will be independent in home exercise program to improve strength/mobility for better functional independence with ADLs.    Baseline  hep compliant    Time  2    Period  Weeks    Status  Partially Met      PT SHORT TERM GOAL #2   Title  Patient will require min cueing for STS transfer with CGA for increased independence with mobility.     Baseline  performs with CGA.     Time  2    Period  Weeks    Status  Achieved      PT SHORT TERM GOAL #3   Title  Patient will maintain upright posture for > 15 seconds to demonstrate strengthened postural control muscualture      Baseline  18 seconds     Time  2    Period  Weeks    Status  Achieved        PT Long Term Goals - 04/02/17 1610      PT LONG TERM GOAL #1   Title  Patient will perform bed mobility with Supervision to increase independent mobility    Baseline  Requires Min A for LE.     Time  8    Period  Weeks    Status  Partially Met      PT LONG TERM GOAL #2   Title  Patient (< 54 years old) will complete five times sit to stand test in < 10 seconds indicating an increased LE strength and improved balance.    Baseline  32 seconds from plinth with single UE on seat and single on walker.     Time  8    Period  Weeks    Status  Partially Met      PT LONG TERM GOAL #3   Title  Patient will ambulate 40 ft with walker with no breaks to allow for increased mobility within home.     Baseline  12/26: 29 ft with walker and WC follow with no rest breaks    Time  8    Period  Weeks    Status  Partially Met      PT LONG TERM GOAL #4   Title  Patient will increase BLE gross strength to 4+/5 as to improve functional strength for independent gait, increased standing tolerance and increased ADL ability.    Baseline  3+/5 to 4-/5 RLE    Time  8    Period  Weeks    Status  Partially Met      PT LONG TERM GOAL #5   Title  Patient will increase lower extremity functional scale to >40/80 to demonstrate improved functional mobility and increased tolerance with ADLs.     Baseline  14/80; 12/26: 21/80    Time  8    Period  Weeks    Status  Partially Met      Additional Long Term Goals   Additional Long Term Goals  Yes      PT LONG TERM GOAL #6   Title  Patient  will walk through a door involving one small step up/down to increase functional independence.     Baseline  unable to step up     Time  8    Period  Weeks    Status  New    Target Date  05/28/17            Plan - 05/07/17 1359    Clinical Impression Statement  Patient demonstrating improved ambulatory mobility, demonstrating ability  to ambulate 105 ft with CGA and RW. Improved static standing ability with decreased UE support with no episodes of LOB.  Patient will continue to benefit from skilled physical therapy to improve strength, balance and gait safety.    Rehab Potential  Fair    Clinical Impairments Affecting Rehab Potential  hx of HTN, HLD, CVA, learning disability, Diabetes, brain tumor,     PT Frequency  2x / week    PT Duration  8 weeks    PT Treatment/Interventions  ADLs/Self Care Home Management;Aquatic Therapy;Ultrasound;Moist Heat;Traction;DME Instruction;Gait training;Stair training;Functional mobility training;Therapeutic activities;Therapeutic exercise;Orthotic Fit/Training;Neuromuscular re-education;Balance training;Patient/family education;Manual techniques;Wheelchair mobility training;Passive range of motion;Energy conservation;Taping;Visual/perceptual remediation/compensation    PT Next Visit Plan  walking in hallway with WC follow     PT Home Exercise Plan  see sheet    Consulted and Agree with Plan of Care  Patient;Family member/caregiver    Family Member Consulted  mother       Patient will benefit from skilled therapeutic intervention in order to improve the following deficits and impairments:  Abnormal gait, Decreased activity tolerance, Decreased balance, Decreased knowledge of precautions, Decreased endurance, Decreased coordination, Decreased knowledge of use of DME, Decreased mobility, Decreased range of motion, Difficulty walking, Decreased safety awareness, Decreased strength, Impaired flexibility, Impaired perceived functional ability, Impaired tone, Postural dysfunction, Improper body mechanics, Pain  Visit Diagnosis: Muscle weakness (generalized)  Other lack of coordination  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  Acute CVA (cerebrovascular accident) ([place]) 05/02/2016  . Left-sided weakness 05/01/2016   Janna Arch, PT, DPT   Janna Arch 05/07/2017, 2:00 PM  [place] MAIN [facility] SERVICES [address] [place], [place], [address] Phone: [phone]   Fax:  [phone]  Name: DARAY POLGAR MRN: [id number] Date of Birth: 1965-06-06

## 2017-05-07 NOTE — Therapy (Signed)
[place] MAIN [facility] SERVICES [address] [place], [place], [address] Phone: [phone]   Fax:  [phone]  Occupational Therapy Treatment  Patient Details  Name: Curtis Powell MRN: [id number] Date of Birth: 06/20/1965 Referring Provider: Dr. Larinda Buttery   Encounter Date: 05/07/2017  OT End of Session - 05/07/17 1353    Visit Number  14    Number of Visits  24    Date for OT Re-Evaluation  07/30/17    OT Start Time  1345    OT Stop Time  1430    OT Time Calculation (min)  45 min    Activity Tolerance  Patient tolerated treatment well    Behavior During Therapy  [facility] for tasks assessed/performed       Past Medical History:  Diagnosis Date  . GERD (gastroesophageal reflux disease)   . Hyperlipidemia   . Hypertension   . Obesity   . Stroke [facility])     Past Surgical History:  Procedure Laterality Date  . BRAIN SURGERY      There were no vitals filed for this visit.  Subjective Assessment - 05/07/17 1352    Subjective   Pt. reports he is feeling better.    Patient is accompained by:  Family member    Pertinent History  Pt. is a 52 y.o. male who suffered a CVA on 05/01/2016. Pt. was admitted to the hospital. Once discharged, he received Home Health PT and OT services for about a month. Pt. has had multiple CVAs over the past 8 years, and has had multiple falls in the past 6 months. Pt. resides in an apartment. Pt. has caregivers for 80 hours. Pt.'s mother stays with pt. at night, and assists with IADL tasks.      Currently in Pain?  Yes    Pain Score  1     Pain Location  Shoulder    Pain Orientation  Right    Pain Type  Acute pain    Pain Onset  Today         [facility] OT Assessment - 05/07/17 1419      Coordination   Right 9 Hole Peg Test  53    Left 9 Hole Peg Test  47      AROM   Overall AROM Comments  Right shoulder scaption 60, Abduction: 87      Hand Function   Right Hand Grip (lbs)  27    Right Hand  Lateral Pinch  16 lbs    Right Hand 3 Point Pinch  10 lbs    Left Hand Grip (lbs)  50    Left Hand Lateral Pinch  18 lbs    Left 3 point pinch  15 lbs      OT TREATMENT    Neuro muscular re-education:  [facility] skills were assessed using the 9 hole peg test.  Therapeutic Exercise:  Pt. UE ROM, strength, grip strength, and pinch strength were assessed.  Selfcare:  ADL/IADL Goals were reviewed with pt.                    OT Education - 05/07/17 1353    Education provided  Yes    Education Details  [place], UE ROM, and strength    Person(s) Educated  Patient    Methods  Explanation;Demonstration;Verbal cues    Comprehension  Verbalized understanding;Returned demonstration       OT Short Term Goals - 03/03/17 1459  OT SHORT TERM GOAL #1   Title  `        OT Long Term Goals - 05/07/17 1401      OT LONG TERM GOAL #1   Title  Pt. will UE functioning to be able to perfrom toilet hygiene with minimal assistance.    Baseline  Pt. is unable. 05/07/2017: Pt. continues to occassionally have difficulty.    Time  12    Period  Weeks    Status  On-going    Target Date  07/30/17      OT LONG TERM GOAL #2   Title  Pt. will complete self grooming with minA    Baseline  Pt. requires extensive assist. Pt. is improving with oral care with set-up, however requires extensive assist for self-grooming tasls.    Time  12    Period  Weeks    Status  On-going    Target Date  07/30/17      OT LONG TERM GOAL #3   Title  Pt. will perfrom self-feeding skills with modified indepndence    Baseline  Pt. has difficutly, and performs with increased spillage 05/07/2017: Pt. has difficulty with occassionally spilling.    Time  12    Period  Weeks    Status  On-going    Target Date  07/30/17      OT LONG TERM GOAL #4   Title  Pt. will perform self dressing with minA and A/E as needed.    Baseline  Pt. requires extensive assist 07/30/2017: Pt. conitnues to have difficulty perfroming  UE,a nd LE dressing.    Time  12    Period  Weeks    Status  On-going    Target Date  07/30/17      OT LONG TERM GOAL #5   Title  Pt. will perfrom light home making tasks with minA    Baseline  Pt. requires extensive assistance 05/07/2017: Pt. continues to have difficulty folding clothes. Pt. is improving with making a sandwich.    Time  12    Period  Weeks    Status  On-going    Target Date  07/30/17      OT LONG TERM GOAL #6   Title  Pt. will write his name efficiently with 100% legibility    Baseline  50% legibility with incresed time required 04/27/2017: Pt. continues to present with difficulty writing legibly.     Time  12    Period  Weeks    Status  On-going    Target Date  07/30/17            Plan - 05/07/17 1354    Clinical Impression Statement Pt. reports he is improving, and is now able to use his bilateral hands to help make a sandwich, opeing peanut butter crackers, and peeling oranges, oral care, brushing teeth with set-up, and is starting to improve with UE dressing, and transferring to the toilet. Pt. continues to have difficulty wiith ADL tasks including; LE dressing, toileting care, and grooming. Pt. continues to work on improving UE strength, and coordination skills for improved ADL, and IADL functioning.    Occupational performance deficits (Please refer to evaluation for details):  ADL's;IADL's    Rehab Potential  Fair    OT Frequency  2x / week    OT Duration  12 weeks    OT Treatment/Interventions  Self-care/ADL training;Patient/family education;DME and/or AE instruction;Therapeutic exercise;Moist Heat    Clinical Decision Making  Several treatment  options, min-mod task modification necessary    Consulted and Agree with Plan of Care  Patient       Patient will benefit from skilled therapeutic intervention in order to improve the following deficits and impairments:  Pain, Decreased cognition, Decreased activity tolerance, Impaired UE functional use,  Decreased range of motion, Decreased coordination, Decreased endurance  Visit Diagnosis: Muscle weakness (generalized)  Other lack of coordination    Problem List Patient Active Problem List   Diagnosis Date Noted  . Acute CVA (cerebrovascular accident) ([place]) 05/02/2016  . Left-sided weakness 05/01/2016    Harrel Carina, MS, OTR/L 05/07/2017, 4:56 PM  [place] MAIN [facility] SERVICES [address] [place], [place], [address] Phone: [phone]   Fax:  [phone]  Name: MOUHAMAD TEED MRN: [id number] Date of Birth: Apr 01, 1966

## 2017-05-12 ENCOUNTER — Encounter: Payer: Self-pay | Admitting: Occupational Therapy

## 2017-05-12 ENCOUNTER — Ambulatory Visit: Payer: Medicare HMO

## 2017-05-12 ENCOUNTER — Ambulatory Visit: Payer: Medicare HMO | Attending: Family Medicine | Admitting: Occupational Therapy

## 2017-05-12 DIAGNOSIS — R2689 Other abnormalities of gait and mobility: Secondary | ICD-10-CM | POA: Insufficient documentation

## 2017-05-12 DIAGNOSIS — R278 Other lack of coordination: Secondary | ICD-10-CM | POA: Diagnosis present

## 2017-05-12 DIAGNOSIS — M6281 Muscle weakness (generalized): Secondary | ICD-10-CM | POA: Insufficient documentation

## 2017-05-12 DIAGNOSIS — I69351 Hemiplegia and hemiparesis following cerebral infarction affecting right dominant side: Secondary | ICD-10-CM

## 2017-05-12 NOTE — Therapy (Signed)
[place] MAIN [facility] SERVICES [address] [place], [place], [address] Phone: [phone]   Fax:  [phone]  Occupational Therapy Treatment  Patient Details  Name: Curtis Powell MRN: [id number] Date of Birth: 02-Aug-1965 Referring Provider: Dr. Larinda Buttery   Encounter Date: 05/12/2017  OT End of Session - 05/12/17 1701    Visit Number  15    Number of Visits  24    Date for OT Re-Evaluation  07/30/17    Authorization Type  Medicare G Code 9 of 10    OT Start Time  1430    OT Stop Time  1515    OT Time Calculation (min)  45 min    Activity Tolerance  Patient tolerated treatment well    Behavior During Therapy  [facility] for tasks assessed/performed       Past Medical History:  Diagnosis Date  . GERD (gastroesophageal reflux disease)   . Hyperlipidemia   . Hypertension   . Obesity   . Stroke [facility])     Past Surgical History:  Procedure Laterality Date  . BRAIN SURGERY      There were no vitals filed for this visit.  Subjective Assessment - 05/12/17 1652    Subjective   Pt reports that his R shoulder hurt since he got into the [place] today.    Pertinent History  Pt. is a 52 y.o. male who suffered a CVA on 05/01/2016. Pt. was admitted to the hospital. Once discharged, he received Home Health PT and OT services for about a month. Pt. has had multiple CVAs over the past 8 years, and has had multiple falls in the past 6 months. Pt. resides in an apartment. Pt. has caregivers for 80 hours. Pt.'s mother stays with pt. at night, and assists with IADL tasks.      Patient Stated Goals  To be able to throw a ball and dribble a ball.    Pain Score  6     Pain Location  Arm    Pain Orientation  Right    Pain Descriptors / Indicators  Aching    Pain Type  Acute pain    Pain Onset  Today    Pain Frequency  Intermittent    Aggravating Factors   Pt said pain started when he got into the [name] today but no injury noted to cause it.    Multiple Pain Sites  No                   OT Treatments/Exercises (OP) - 05/12/17 1654      ADLs   Writing  Pt practiced handwriting using 2 differnt pencil grippers and tried a pen.  He was able to control pencil better than pen and had improved control with pen gripper with indentation for thumb and index finger.  He was able to write his name and street address but had difficulty with city and state.He needed a rest break for stamina during task and educated in stretching exercise for R hand with wrist, fingers and elbow in extension.        Neurological Re-education Exercises   Other Exercises 1  Pt seen for stretching and AAROM reaching tasks sitting at table to decrease pain from 6/10 to 2/10.  Mod cues for technique for self AROM for proper technique.  Verbal and tactile cues for sitting upright in w/c.  Gentle scapular mobilization assisted with decreased pain as well.  OT Education - 05/12/17 1700    Education Details  handwriting tech and stretching/self ROM to decrease pain and tightness RUE    Person(s) Educated  Patient    Methods  Explanation;Tactile cues;Verbal cues;Demonstration    Comprehension  Verbalized understanding       OT Short Term Goals - 03/03/17 1459      OT SHORT TERM GOAL #1   Title  `        OT Long Term Goals - 05/12/17 1705      OT LONG TERM GOAL #1   Title  Pt. will UE functioning to be able to perfrom toilet hygiene with minimal assistance.    Baseline  Pt. is unable. 05/07/2017: Pt. continues to occassionally have difficulty.    Time  12    Period  Weeks    Status  On-going    Target Date  07/30/17      OT LONG TERM GOAL #2   Title  Pt. will complete self grooming with minA    Baseline  Pt. requires extensive assist. Pt. is improving with oral care with set-up, however requires extensive assist for self-grooming tasls.    Time  12    Period  Weeks    Status  On-going    Target Date  07/30/17      OT LONG  TERM GOAL #3   Title  Pt. will perfrom self-feeding skills with modified indepndence    Baseline  Pt. has difficutly, and performs with increased spillage 05/07/2017: Pt. has difficulty with occassionally spilling.    Time  12    Period  Weeks    Status  On-going    Target Date  07/30/17      OT LONG TERM GOAL #4   Title  Pt. will perform self dressing with minA and A/E as needed.    Baseline  Pt. requires extensive assist: Pt. conitnues to have difficulty perfroming UE,a nd LE dressing.    Time  12    Period  Weeks    Status  On-going    Target Date  07/30/17      OT LONG TERM GOAL #5   Title  Pt. will perfrom light home making tasks with minA    Baseline  Pt. requires extensive assistance 05/07/2017: Pt. continues to have difficulty folding clothes. Pt. is improving with making a sandwich.    Time  12    Period  Weeks    Status  On-going    Target Date  07/30/17      OT LONG TERM GOAL #6   Title  Pt. will write his name efficiently with 100% legibility    Baseline  50% legibility with incresed time required 04/27/2017: Pt. continues to present with difficulty writing legibly.     Time  12    Period  Weeks    Status  On-going    Target Date  07/30/17            Plan - 05/12/17 1702    Clinical Impression Statement  Pt continues to perform ADL tasks from last session to continue to be more independent and not rely on his caregiver to do too much for him. He had pain in his RUE which decreased from 6/10 to 2/10 after scapular mobilization, stretching and self ROM on tabletop with mod cues.  Improvement in handwriting with RUE today using a different pencil gripper.  Pt continues to work in increasing functional use of RUE and hand  for increasing independence in ADLS and IADLS.    Occupational performance deficits (Please refer to evaluation for details):  ADL's;IADL's    Rehab Potential  Fair    OT Frequency  2x / week    OT Duration  12 weeks    OT Treatment/Interventions   Self-care/ADL training;Patient/family education;DME and/or AE instruction;Therapeutic exercise;Moist Heat    Clinical Decision Making  Several treatment options, min-mod task modification necessary    Consulted and Agree with Plan of Care  Patient       Patient will benefit from skilled therapeutic intervention in order to improve the following deficits and impairments:  Pain, Decreased cognition, Decreased activity tolerance, Impaired UE functional use, Decreased range of motion, Decreased coordination, Decreased endurance  Visit Diagnosis: Muscle weakness (generalized)  Other lack of coordination  Hemiplegia and hemiparesis following cerebral infarction affecting right dominant side [facility])    Problem List Patient Active Problem List   Diagnosis Date Noted  . Acute CVA (cerebrovascular accident) ([place]) 05/02/2016  . Left-sided weakness 05/01/2016    Wofford,Susan 05/12/2017, 5:10 PM  [place] MAIN [facility] SERVICES [address] [place], [place], [address] Phone: [phone]   Fax:  [phone]  Name: JEMARIO POITRAS MRN: [id number] Date of Birth: Feb 07, 1966

## 2017-05-12 NOTE — Therapy (Signed)
[place] MAIN [facility] SERVICES [address] [place], [place], [address] Phone: [phone]   Fax:  [phone]  Physical Therapy Treatment  Patient Details  Name: HARBERT FITTERER MRN: [id number] Date of Birth: 11-27-1965 Referring Provider: Dr. Larinda Buttery   Encounter Date: 05/12/2017  PT End of Session - 05/12/17 1522    Visit Number  18    Number of Visits  32    Date for PT Re-Evaluation  05/28/17    PT Start Time  6812    PT Stop Time  1600    PT Time Calculation (min)  44 min    Equipment Utilized During Treatment  Gait belt    Activity Tolerance  Patient tolerated treatment well    Behavior During Therapy  [facility] for tasks assessed/performed       Past Medical History:  Diagnosis Date  . GERD (gastroesophageal reflux disease)   . Hyperlipidemia   . Hypertension   . Obesity   . Stroke [facility])     Past Surgical History:  Procedure Laterality Date  . BRAIN SURGERY      There were no vitals filed for this visit.  Subjective Assessment - 05/12/17 1522    Subjective  Patient fatigued today, Reports increased arm pain, Reports compliance with HEP, no stumbles or falls since last visit.     Pertinent History   Patient is a pleasant 52 year old male who presents to physical therapy for weakness and immobility secondary to CVA.  Had a stroke in Feb 2018. He was previously fully independent, but this stroke caused severe residual deficits, mainly on the right side as well as speech, and now he is unable to walk or perform most of his ADLs on his own. Entire right side is very weak. He still has a little difficulty with speech but his swallowing is improved to baseline. His mom had to move in with him and now is his main caregiver.     Limitations  Sitting;Lifting;Standing;Walking;House hold activities    How long can you sit comfortably?  5 minutes    How long can you stand comfortably?  5 minutes    How long can you walk  comfortably?  10 ft    Patient Stated Goals  Walk without walker, walk further with walker, get some strength in back     Currently in Pain?  Yes    Pain Score  6     Pain Location  Shoulder    Pain Orientation  Right    Pain Descriptors / Indicators  Sharp    Pain Type  Acute pain    Pain Onset  In the past 7 days    Pain Frequency  Intermittent       TREATMENT: Warm up on Nustep level 2 x4 min; no UE just BLE due to R shoulder pain;    WC to Nustep SPT transfer with RW and CGA. Good hand placement noted and initation of movement,no posterior LOB X 2 reps    WC to mat with SPT transfer and CGA with verbal cues for forward weight shift due to posterior tendency for trunk lean. X 2 reps.   Mat mobility: scooting: cues to reach with UE and push with opposite LE to encourage scooting to L and R seated EOB, Practice scooting laterally to get into bed further to the  Middle via lateral bridges (push with opp UE of direction moving) rolling R and L in bed (  straighten leg closest to side rolling to bend other side to push),     #5 ankle weights Seated marches 2x 20 trials  Seated LAQ 1x 15 each leg, cues for upright posture and increased ROM.   Bathroom mobility, STS, prolonged standing duration. Cues for decreasing trunk flexion due to fatigue, able to maintain upright posture with decreased need for UE support.   Pt. response to medical necessity: Patient will continue to benefit from skilled physical therapy to improve strength, balance and gait safety                     PT Education - 05/12/17 1526    Education provided  Yes    Education Details  bed mobility, scooting in bed, strength and mobility     Person(s) Educated  Patient    Methods  Explanation;Demonstration;Verbal cues    Comprehension  Verbalized understanding;Returned demonstration       PT Short Term Goals - 04/02/17 1608      PT SHORT TERM GOAL #1   Title  Patient will be independent in  home exercise program to improve strength/mobility for better functional independence with ADLs.    Baseline  hep compliant    Time  2    Period  Weeks    Status  Partially Met      PT SHORT TERM GOAL #2   Title  Patient will require min cueing for STS transfer with CGA for increased independence with mobility.     Baseline  performs with CGA.     Time  2    Period  Weeks    Status  Achieved      PT SHORT TERM GOAL #3   Title  Patient will maintain upright posture for > 15 seconds to demonstrate strengthened postural control muscualture     Baseline  18 seconds     Time  2    Period  Weeks    Status  Achieved        PT Long Term Goals - 04/02/17 1610      PT LONG TERM GOAL #1   Title  Patient will perform bed mobility with Supervision to increase independent mobility    Baseline  Requires Min A for LE.     Time  8    Period  Weeks    Status  Partially Met      PT LONG TERM GOAL #2   Title  Patient (< 25 years old) will complete five times sit to stand test in < 10 seconds indicating an increased LE strength and improved balance.    Baseline  32 seconds from plinth with single UE on seat and single on walker.     Time  8    Period  Weeks    Status  Partially Met      PT LONG TERM GOAL #3   Title  Patient will ambulate 40 ft with walker with no breaks to allow for increased mobility within home.     Baseline  12/26: 29 ft with walker and WC follow with no rest breaks    Time  8    Period  Weeks    Status  Partially Met      PT LONG TERM GOAL #4   Title  Patient will increase BLE gross strength to 4+/5 as to improve functional strength for independent gait, increased standing tolerance and increased ADL ability.    Baseline  3+/5 to 4-/5  RLE    Time  8    Period  Weeks    Status  Partially Met      PT LONG TERM GOAL #5   Title  Patient will increase lower extremity functional scale to >40/80 to demonstrate improved functional mobility and increased tolerance with  ADLs.     Baseline  14/80; 12/26: 21/80    Time  8    Period  Weeks    Status  Partially Met      Additional Long Term Goals   Additional Long Term Goals  Yes      PT LONG TERM GOAL #6   Title  Patient will walk through a door involving one small step up/down to increase functional independence.     Baseline  unable to step up     Time  8    Period  Weeks    Status  New    Target Date  05/28/17            Plan - 05/12/17 2023    Clinical Impression Statement  Patient educated on bed mobility to improve independent mobility. Initially required Min A to assist in scooting laterally and medially and progressed to Supervision with Mod verbal cueing. Rolling R and L challenging to patient at this time due to R shoulder pain. Patient will continue to benefit from skilled physical therapy to improve strength, balance and gait safety    Rehab Potential  Fair    Clinical Impairments Affecting Rehab Potential  hx of HTN, HLD, CVA, learning disability, Diabetes, brain tumor,     PT Frequency  2x / week    PT Duration  8 weeks    PT Treatment/Interventions  ADLs/Self Care Home Management;Aquatic Therapy;Ultrasound;Moist Heat;Traction;DME Instruction;Gait training;Stair training;Functional mobility training;Therapeutic activities;Therapeutic exercise;Orthotic Fit/Training;Neuromuscular re-education;Balance training;Patient/family education;Manual techniques;Wheelchair mobility training;Passive range of motion;Energy conservation;Taping;Visual/perceptual remediation/compensation    PT Next Visit Plan  walking in hallway with WC follow     PT Home Exercise Plan  see sheet    Consulted and Agree with Plan of Care  Patient;Family member/caregiver    Family Member Consulted  mother       Patient will benefit from skilled therapeutic intervention in order to improve the following deficits and impairments:  Abnormal gait, Decreased activity tolerance, Decreased balance, Decreased knowledge of  precautions, Decreased endurance, Decreased coordination, Decreased knowledge of use of DME, Decreased mobility, Decreased range of motion, Difficulty walking, Decreased safety awareness, Decreased strength, Impaired flexibility, Impaired perceived functional ability, Impaired tone, Postural dysfunction, Improper body mechanics, Pain  Visit Diagnosis: Muscle weakness (generalized)  Other lack of coordination  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 Acute CVA (cerebrovascular accident) ([place]) 05/02/2016  . Left-sided weakness 05/01/2016   Janna Arch, PT, DPT   Janna Arch 05/12/2017, 8:24 PM  [place] MAIN [facility] SERVICES [address] [place], [place], [address] Phone: [phone]   Fax:  [phone]  Name: BRET VANESSEN MRN: [id number] Date of Birth: 1966-01-03

## 2017-05-14 ENCOUNTER — Ambulatory Visit: Payer: Medicare HMO | Admitting: Occupational Therapy

## 2017-05-14 ENCOUNTER — Ambulatory Visit: Payer: Medicare HMO

## 2017-05-19 ENCOUNTER — Ambulatory Visit: Payer: Medicare HMO

## 2017-05-19 ENCOUNTER — Ambulatory Visit: Payer: Medicare HMO | Admitting: Occupational Therapy

## 2017-05-19 ENCOUNTER — Encounter: Payer: Self-pay | Admitting: Occupational Therapy

## 2017-05-19 DIAGNOSIS — M6281 Muscle weakness (generalized): Secondary | ICD-10-CM

## 2017-05-19 DIAGNOSIS — R2689 Other abnormalities of gait and mobility: Secondary | ICD-10-CM

## 2017-05-19 DIAGNOSIS — R278 Other lack of coordination: Secondary | ICD-10-CM

## 2017-05-19 DIAGNOSIS — I69351 Hemiplegia and hemiparesis following cerebral infarction affecting right dominant side: Secondary | ICD-10-CM

## 2017-05-19 NOTE — Therapy (Signed)
[place] MAIN [facility] SERVICES [address] [place], [place], [address] Phone: [phone]   Fax:  [phone]  Physical Therapy Treatment  Patient Details  Name: Curtis Powell MRN: [id number] Date of Birth: 1966/02/16 Referring Provider: Dr. Larinda Buttery   Encounter Date: 05/19/2017  PT End of Session - 05/19/17 1439    Visit Number  19    Number of Visits  32    Date for PT Re-Evaluation  05/28/17    PT Start Time  1430    PT Stop Time  1515    PT Time Calculation (min)  45 min    Equipment Utilized During Treatment  Gait belt    Activity Tolerance  Patient tolerated treatment well    Behavior During Therapy  [facility] for tasks assessed/performed       Past Medical History:  Diagnosis Date  . GERD (gastroesophageal reflux disease)   . Hyperlipidemia   . Hypertension   . Obesity   . Stroke [facility])     Past Surgical History:  Procedure Laterality Date  . BRAIN SURGERY      There were no vitals filed for this visit.  Subjective Assessment - 05/19/17 1437    Subjective  Patient had fall wednesday evening after caregiver left. Was trying to get out of bed, legs came off but couldn't push body up with arm due to pain, fell down and had to crawl to phone to call for assistance.     Pertinent History   Patient is a pleasant 52 year old male who presents to physical therapy for weakness and immobility secondary to CVA.  Had a stroke in Feb 2018. He was previously fully independent, but this stroke caused severe residual deficits, mainly on the right side as well as speech, and now he is unable to walk or perform most of his ADLs on his own. Entire right side is very weak. He still has a little difficulty with speech but his swallowing is improved to baseline. His mom had to move in with him and now is his main caregiver.     Limitations  Sitting;Lifting;Standing;Walking;House hold activities    How long can you sit comfortably?  5  minutes    How long can you stand comfortably?  5 minutes    How long can you walk comfortably?  10 ft    Patient Stated Goals  Walk without walker, walk further with walker, get some strength in back     Currently in Pain?  Yes    Pain Score  4     Pain Location  Arm    Pain Orientation  Right    Pain Descriptors / Indicators  Aching    Pain Type  Acute pain    Pain Onset  1 to 4 weeks ago    Pain Frequency  Intermittent         TREATMENT: Warm up on Nustep level 3 x4 min; no UE just BLE due to R shoulder pain;    WC to Nustep SPT transfer with RW and CGA. Good hand placement noted and initation of movement,no posterior LOB X 2 reps    WC to mat with SPT transfer and CGA with verbal cues for forward weight shift due to posterior tendency for trunk lean. X 2 reps.    Mat mobility: Getting into and out of bed via log rolling x 5 ) rolling R and L in bed (straighten leg closest to side  rolling to bend other side to push), , scooting left and right on EOB, Transfer from mat to wheelchair at 45 degree back and forth with Min A  Cues for reaching with opp UE to other side of wheelchair and pushing with farthest hand off plinth    #5 ankle weights Seated marches 2x 20 trials , cues to place hands on stomach to increase abdominal activation  Seated LAQ 1x 15 each leg with 3 second holds, cues for upright posture and increased ROM.      Seated adduction squeezing ball 2x 15 with 3 second holds   Pt. response to medical necessity:  Patient will continue to benefit from skilled physical therapy to improve strength, balance and gait safety                      PT Education - 05/19/17 1438    Education provided  Yes    Education Details  mobility, bed mobility getting into and out of bed, LE strength    Person(s) Educated  Patient    Methods  Explanation;Demonstration;Verbal cues    Comprehension  Verbalized understanding;Returned demonstration       PT Short Term  Goals - 04/02/17 1608      PT SHORT TERM GOAL #1   Title  Patient will be independent in home exercise program to improve strength/mobility for better functional independence with ADLs.    Baseline  hep compliant    Time  2    Period  Weeks    Status  Partially Met      PT SHORT TERM GOAL #2   Title  Patient will require min cueing for STS transfer with CGA for increased independence with mobility.     Baseline  performs with CGA.     Time  2    Period  Weeks    Status  Achieved      PT SHORT TERM GOAL #3   Title  Patient will maintain upright posture for > 15 seconds to demonstrate strengthened postural control muscualture     Baseline  18 seconds     Time  2    Period  Weeks    Status  Achieved        PT Long Term Goals - 04/02/17 1610      PT LONG TERM GOAL #1   Title  Patient will perform bed mobility with Supervision to increase independent mobility    Baseline  Requires Min A for LE.     Time  8    Period  Weeks    Status  Partially Met      PT LONG TERM GOAL #2   Title  Patient (< 56 years old) will complete five times sit to stand test in < 10 seconds indicating an increased LE strength and improved balance.    Baseline  32 seconds from plinth with single UE on seat and single on walker.     Time  8    Period  Weeks    Status  Partially Met      PT LONG TERM GOAL #3   Title  Patient will ambulate 40 ft with walker with no breaks to allow for increased mobility within home.     Baseline  12/26: 29 ft with walker and WC follow with no rest breaks    Time  8    Period  Weeks    Status  Partially Met  PT LONG TERM GOAL #4   Title  Patient will increase BLE gross strength to 4+/5 as to improve functional strength for independent gait, increased standing tolerance and increased ADL ability.    Baseline  3+/5 to 4-/5 RLE    Time  8    Period  Weeks    Status  Partially Met      PT LONG TERM GOAL #5   Title  Patient will increase lower extremity functional  scale to >40/80 to demonstrate improved functional mobility and increased tolerance with ADLs.     Baseline  14/80; 12/26: 21/80    Time  8    Period  Weeks    Status  Partially Met      Additional Long Term Goals   Additional Long Term Goals  Yes      PT LONG TERM GOAL #6   Title  Patient will walk through a door involving one small step up/down to increase functional independence.     Baseline  unable to step up     Time  8    Period  Weeks    Status  New    Target Date  05/28/17            Plan - 05/19/17 1603    Clinical Impression Statement  Patient educated in safe bed transfers due to fall at home last session. Patient educated on need to angle wheelchair rather than have it facing bed to decrease fall risk. Right shoulder pain limiting in bed mobility and ambulation at this time due to reliance upon assistive devices. Patient will continue to benefit from skilled physical therapy to improve strength, balance and gait safety    Rehab Potential  Fair    Clinical Impairments Affecting Rehab Potential  hx of HTN, HLD, CVA, learning disability, Diabetes, brain tumor,     PT Frequency  2x / week    PT Duration  8 weeks    PT Treatment/Interventions  ADLs/Self Care Home Management;Aquatic Therapy;Ultrasound;Moist Heat;Traction;DME Instruction;Gait training;Stair training;Functional mobility training;Therapeutic activities;Therapeutic exercise;Orthotic Fit/Training;Neuromuscular re-education;Balance training;Patient/family education;Manual techniques;Wheelchair mobility training;Passive range of motion;Energy conservation;Taping;Visual/perceptual remediation/compensation    PT Next Visit Plan  bed mobility, standing strengthening interventions    PT Home Exercise Plan  see sheet    Consulted and Agree with Plan of Care  Patient;Family member/caregiver    Family Member Consulted  mother       Patient will benefit from skilled therapeutic intervention in order to improve the  following deficits and impairments:  Abnormal gait, Decreased activity tolerance, Decreased balance, Decreased knowledge of precautions, Decreased endurance, Decreased coordination, Decreased knowledge of use of DME, Decreased mobility, Decreased range of motion, Difficulty walking, Decreased safety awareness, Decreased strength, Impaired flexibility, Impaired perceived functional ability, Impaired tone, Postural dysfunction, Improper body mechanics, Pain  Visit Diagnosis: Muscle weakness (generalized)  Other lack of coordination  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 Acute CVA (cerebrovascular accident) ([place]) 05/02/2016  . Left-sided weakness 05/01/2016   Janna Arch, PT, DPT   Janna Arch 05/19/2017, 4:05 PM  [place] MAIN [facility] SERVICES [address] [place], [place], [address] Phone: [phone]   Fax:  [phone]  Name: Curtis Powell MRN: [id number] Date of Birth: 07-04-1965

## 2017-05-19 NOTE — Therapy (Signed)
[place] MAIN [facility] SERVICES [address] [place], [place], [address] Phone: [phone]   Fax:  [phone]  Occupational Therapy Treatment  Patient Details  Name: Curtis Powell MRN: [id number] Date of Birth: [birth year] Referring Provider: Dr. Larinda Buttery   Encounter Date: 05/19/2017  OT End of Session - 05/19/17 1530    Visit Number  16    Number of Visits  24    Date for OT Re-Evaluation  07/30/17    OT Start Time  1525    OT Stop Time  1600    OT Time Calculation (min)  35 min    Activity Tolerance  Patient tolerated treatment well    Behavior During Therapy  [facility] for tasks assessed/performed       Past Medical History:  Diagnosis Date  . GERD (gastroesophageal reflux disease)   . Hyperlipidemia   . Hypertension   . Obesity   . Stroke [facility])     Past Surgical History:  Procedure Laterality Date  . BRAIN SURGERY      There were no vitals filed for this visit.  Subjective Assessment - 05/19/17 1527    Subjective   Pt. reports his right arm is bothering him today.    Patient is accompained by:  Family member    Pertinent History  Pt. is a 52 y.o. male who suffered a CVA on 05/01/2016. Pt. was admitted to the hospital. Once discharged, he received Home Health PT and OT services for about a month. Pt. has had multiple CVAs over the past 8 years, and has had multiple falls in the past 6 months. Pt. resides in an apartment. Pt. has caregivers for 80 hours. Pt.'s mother stays with pt. at night, and assists with IADL tasks.      Patient Stated Goals  To be able to throw a ball and dribble a ball.    Currently in Pain?  Yes    Pain Score  7        OT TREATMENT    Neuro muscular re-education:  Pt. Worked on grasping coins with his left hand from a tabletop surface, placing them into a resistive container, and pushing them through the slot while isolating his 2nd digit. A resistive mat was placed under coins to aide in  manipulating the coins and prevent sliding when picking them up. Pt. worked on grasping 1" resistive cubes alternating thumb opposition to the tip of the 2nd through 5th digits while the board is placed at a vertical angle. Pt. worked on pressing the cubes back into place while alternating isolated 2nd through 5th digit extension.  Therapeutic Exercise:  Pt. Worked on pinch strengthening in the left hand for lateral, and 3pt. pinch using red, green, and blue resistive clips. Pt. worked on placing the clips at various vertical and horizontal angles. Tactile and verbal cues were required for eliciting the desired movement.                         OT Education - 05/19/17 1530    Education provided  Yes    Education Details  [facility] skills    Person(s) Educated  Patient    Methods  Explanation;Demonstration;Verbal cues    Comprehension  Verbalized understanding;Returned demonstration       OT Short Term Goals - 03/03/17 1459      OT SHORT TERM GOAL #1   Title  `  OT Long Term Goals - 05/12/17 1705      OT LONG TERM GOAL #1   Title  Pt. will UE functioning to be able to perfrom toilet hygiene with minimal assistance.    Baseline  Pt. is unable. 05/07/2017: Pt. continues to occassionally have difficulty.    Time  12    Period  Weeks    Status  On-going    Target Date  07/30/17      OT LONG TERM GOAL #2   Title  Pt. will complete self grooming with minA    Baseline  Pt. requires extensive assist. Pt. is improving with oral care with set-up, however requires extensive assist for self-grooming tasls.    Time  12    Period  Weeks    Status  On-going    Target Date  07/30/17      OT LONG TERM GOAL #3   Title  Pt. will perfrom self-feeding skills with modified indepndence    Baseline  Pt. has difficutly, and performs with increased spillage 05/07/2017: Pt. has difficulty with occassionally spilling.    Time  12    Period  Weeks    Status  On-going    Target  Date  07/30/17      OT LONG TERM GOAL #4   Title  Pt. will perform self dressing with minA and A/E as needed.    Baseline  Pt. requires extensive assist: Pt. conitnues to have difficulty perfroming UE,a nd LE dressing.    Time  12    Period  Weeks    Status  On-going    Target Date  07/30/17      OT LONG TERM GOAL #5   Title  Pt. will perfrom light home making tasks with minA    Baseline  Pt. requires extensive assistance 05/07/2017: Pt. continues to have difficulty folding clothes. Pt. is improving with making a sandwich.    Time  12    Period  Weeks    Status  On-going    Target Date  07/30/17      OT LONG TERM GOAL #6   Title  Pt. will write his name efficiently with 100% legibility    Baseline  50% legibility with incresed time required 04/27/2017: Pt. continues to present with difficulty writing legibly.     Time  12    Period  Weeks    Status  On-going    Target Date  07/30/17            Plan - 05/19/17 1531    Clinical Impression Statement  Pt. reports he had a fall getting out og bed last week. Pt. reports his RUE is bothering him from having to crawl. Pt. Rested his RUE, and focused on his LUE during the treatment session.   Occupational performance deficits (Please refer to evaluation for details):  ADL's;IADL's    Rehab Potential  Fair    OT Frequency  2x / week    OT Duration  12 weeks    OT Treatment/Interventions  Self-care/ADL training;Patient/family education;DME and/or AE instruction;Therapeutic exercise;Moist Heat    Clinical Decision Making  Several treatment options, min-mod task modification necessary    Consulted and Agree with Plan of Care  Patient       Patient will benefit from skilled therapeutic intervention in order to improve the following deficits and impairments:  Pain, Decreased cognition, Decreased activity tolerance, Impaired UE functional use, Decreased range of motion, Decreased coordination, Decreased endurance  Visit  Diagnosis: Muscle weakness (generalized)  Other lack of coordination    Problem List Patient Active Problem List   Diagnosis Date Noted  . Acute CVA (cerebrovascular accident) ([place]) 05/02/2016  . Left-sided weakness 05/01/2016    Luberta Mutter, OTR/L 05/19/2017, 3:36 PM  [place] MAIN [facility] SERVICES [address] [place], [place], [address] Phone: [phone]   Fax:  [phone]  Name: Curtis Powell MRN: [id number] Date of Birth: 1965-12-11

## 2017-05-21 ENCOUNTER — Ambulatory Visit: Payer: Medicare HMO | Admitting: Occupational Therapy

## 2017-05-21 ENCOUNTER — Encounter: Payer: Self-pay | Admitting: Occupational Therapy

## 2017-05-21 ENCOUNTER — Ambulatory Visit: Payer: Medicare HMO

## 2017-05-21 DIAGNOSIS — R2689 Other abnormalities of gait and mobility: Secondary | ICD-10-CM

## 2017-05-21 DIAGNOSIS — M6281 Muscle weakness (generalized): Secondary | ICD-10-CM

## 2017-05-21 DIAGNOSIS — I69351 Hemiplegia and hemiparesis following cerebral infarction affecting right dominant side: Secondary | ICD-10-CM

## 2017-05-21 DIAGNOSIS — R278 Other lack of coordination: Secondary | ICD-10-CM

## 2017-05-21 NOTE — Therapy (Signed)
[place] MAIN [facility] SERVICES [address] [place], [place], [address] Phone: [phone]   Fax:  [phone]  Occupational Therapy Treatment  Patient Details  Name: Curtis Powell MRN: [id number] Date of Birth: Jun 02, 1965 Referring Provider: Dr. Larinda Buttery   Encounter Date: 05/21/2017  OT End of Session - 05/21/17 1450    Visit Number  17    Number of Visits  24    Date for OT Re-Evaluation  07/30/17    OT Start Time  1430    OT Stop Time  1515    OT Time Calculation (min)  45 min    Activity Tolerance  Patient tolerated treatment well    Behavior During Therapy  [facility] for tasks assessed/performed       Past Medical History:  Diagnosis Date  . GERD (gastroesophageal reflux disease)   . Hyperlipidemia   . Hypertension   . Obesity   . Stroke [facility])     Past Surgical History:  Procedure Laterality Date  . BRAIN SURGERY      There were no vitals filed for this visit.  Subjective Assessment - 05/21/17 1449    Subjective   Pt. reports his right shoulder is feeling better.    Patient is accompained by:  Family member    Pertinent History  Pt. is a 52 y.o. male who suffered a CVA on 05/01/2016. Pt. was admitted to the hospital. Once discharged, he received Home Health PT and OT services for about a month. Pt. has had multiple CVAs over the past 8 years, and has had multiple falls in the past 6 months. Pt. resides in an apartment. Pt. has caregivers for 80 hours. Pt.'s mother stays with pt. at night, and assists with IADL tasks.      Currently in Pain?  No/denies      OT TREATMENT    Neuro muscular re-education:   Therapeutic Exercise:  Pt. Worked on UE there. Ex. With red theraband for Left shoulder flexion, abduction for 1 set 20 reps each. Bilateral elbow flexion, and extension fpr 2 sets 20 reps each. Pt tolerated the exercises well. No reports of pain.  Selfcare:  Pt. Worked on toileting skills. Pt. Required CGA  for clothing negotiation skills. Pt. Required CGA standing at the bathroom bar, and using his personal urinal.                            OT Short Term Goals - 03/03/17 1459      OT SHORT TERM GOAL #1   Title  `        OT Long Term Goals - 05/12/17 1705      OT LONG TERM GOAL #1   Title  Pt. will UE functioning to be able to perfrom toilet hygiene with minimal assistance.    Baseline  Pt. is unable. 05/07/2017: Pt. continues to occassionally have difficulty.    Time  12    Period  Weeks    Status  On-going    Target Date  07/30/17      OT LONG TERM GOAL #2   Title  Pt. will complete self grooming with minA    Baseline  Pt. requires extensive assist. Pt. is improving with oral care with set-up, however requires extensive assist for self-grooming tasls.    Time  12    Period  Weeks    Status  On-going    Target  Date  07/30/17      OT LONG TERM GOAL #3   Title  Pt. will perfrom self-feeding skills with modified indepndence    Baseline  Pt. has difficutly, and performs with increased spillage 05/07/2017: Pt. has difficulty with occassionally spilling.    Time  12    Period  Weeks    Status  On-going    Target Date  07/30/17      OT LONG TERM GOAL #4   Title  Pt. will perform self dressing with minA and A/E as needed.    Baseline  Pt. requires extensive assist: Pt. conitnues to have difficulty perfroming UE,a nd LE dressing.    Time  12    Period  Weeks    Status  On-going    Target Date  07/30/17      OT LONG TERM GOAL #5   Title  Pt. will perfrom light home making tasks with minA    Baseline  Pt. requires extensive assistance 05/07/2017: Pt. continues to have difficulty folding clothes. Pt. is improving with making a sandwich.    Time  12    Period  Weeks    Status  On-going    Target Date  07/30/17      OT LONG TERM GOAL #6   Title  Pt. will write his name efficiently with 100% legibility    Baseline  50% legibility with incresed time  required 04/27/2017: Pt. continues to present with difficulty writing legibly.     Time  12    Period  Weeks    Status  On-going    Target Date  07/30/17            Plan - 05/21/17 1450    Clinical Impression Statement  Pt. reports his RU is feeling better. pt. continues to require assist for clothing negotiation skills during toileting tasks. pt. continues to work on improving UE functioning for improved engagement in ADL, and IADL tasks.    Occupational performance deficits (Please refer to evaluation for details):  ADL's;IADL's    Rehab Potential  Fair    OT Frequency  2x / week    OT Duration  12 weeks    OT Treatment/Interventions  Self-care/ADL training;Patient/family education;DME and/or AE instruction;Therapeutic exercise;Moist Heat    Clinical Decision Making  Several treatment options, min-mod task modification necessary    Consulted and Agree with Plan of Care  Patient       Patient will benefit from skilled therapeutic intervention in order to improve the following deficits and impairments:  Pain, Decreased cognition, Decreased activity tolerance, Impaired UE functional use, Decreased range of motion, Decreased coordination, Decreased endurance  Visit Diagnosis: Muscle weakness (generalized)    Problem List Patient Active Problem List   Diagnosis Date Noted  . Acute CVA (cerebrovascular accident) ([place]) 05/02/2016  . Left-sided weakness 05/01/2016    Harrel Carina, MS, OTR/L 05/21/2017, 2:54 PM  [place] MAIN [facility] SERVICES [address] [place], [place], [address] Phone: [phone]   Fax:  [phone]  Name: DONTREAL MIERA MRN: [id number] Date of Birth: [birth year]

## 2017-05-21 NOTE — Therapy (Signed)
[place] MAIN [facility] SERVICES [address] [place], [place], [address] Phone: [phone]   Fax:  [phone]  Physical Therapy Treatment  Patient Details  Name: Curtis Powell MRN: [id number] Date of Birth: Apr 25, 1965 Referring Provider: Dr. Larinda Buttery   Encounter Date: 05/21/2017  PT End of Session - 05/21/17 1442    Visit Number  20    Number of Visits  32    Date for PT Re-Evaluation  05/28/17    PT Start Time  1344    PT Stop Time  1430    PT Time Calculation (min)  46 min    Equipment Utilized During Treatment  Gait belt    Activity Tolerance  Patient tolerated treatment well    Behavior During Therapy  [facility] for tasks assessed/performed       Past Medical History:  Diagnosis Date  . GERD (gastroesophageal reflux disease)   . Hyperlipidemia   . Hypertension   . Obesity   . Stroke [facility])     Past Surgical History:  Procedure Laterality Date  . BRAIN SURGERY      There were no vitals filed for this visit.  Subjective Assessment - 05/21/17 1436    Subjective  Patient dissapointed in progress towards previous level of function. Wants to walk with quad cane without help, wants to stand up without LOB.     Pertinent History   Patient is a pleasant 52 year old male who presents to physical therapy for weakness and immobility secondary to CVA.  Had a stroke in Feb 2018. He was previously fully independent, but this stroke caused severe residual deficits, mainly on the right side as well as speech, and now he is unable to walk or perform most of his ADLs on his own. Entire right side is very weak. He still has a little difficulty with speech but his swallowing is improved to baseline. His mom had to move in with him and now is his main caregiver.     Limitations  Sitting;Lifting;Standing;Walking;House hold activities    How long can you sit comfortably?  5 minutes    How long can you stand comfortably?  5 minutes    How  long can you walk comfortably?  10 ft    Patient Stated Goals  Walk without walker, walk further with walker, get some strength in back     Currently in Pain?  Yes    Pain Score  6     Pain Location  Shoulder    Pain Orientation  Right    Pain Descriptors / Indicators  Aching;Shooting    Pain Type  Acute pain    Pain Onset  1 to 4 weeks ago    Pain Frequency  Intermittent    Aggravating Factors   weight bearing.       Walk 5 steps with hemi walker in LUE with WC follow CGA x2 person, decreased step length bilaterally, fear of falling with excessive trunk flexion noted.   Sit to stand with hemi walker in LUE and RUE on chair. x4  Walking with LUE only in // bars CGA 2x  , UE fatigue, excessive trunk flexion, when increased neutral position of trunk has posterior LOB, noticing excessive hip flexor tightness  Lunge stretch in // bars to stretch hip flexors 3x 20 seconds each leg.   Marching with hemiwalker  In LUE and finger tips of RUE on bars 2 minutes, patient learning to weight shift  utilizing hemiwalker  Marching with quad cane in LUE. And finger tips of RUE. 2 minutes, patient prefers use of quad cane.   Ambulating backwards in// bars, CGA, 2x 8 ft   Pt. response to medical necessity:  Patient will continue to benefit form skilled physical therapy to improve strength balance and gait safety.                     PT Education - 05/21/17 1440    Education provided  Yes    Education Details  walking with QC, Hemiwalker, lunge stretch for hip flexors     Person(s) Educated  Patient    Methods  Explanation;Demonstration;Verbal cues;Handout    Comprehension  Verbalized understanding;Returned demonstration;Verbal cues required;Tactile cues required       PT Short Term Goals - 04/02/17 1608      PT SHORT TERM GOAL #1   Title  Patient will be independent in home exercise program to improve strength/mobility for better functional independence with ADLs.     Baseline  hep compliant    Time  2    Period  Weeks    Status  Partially Met      PT SHORT TERM GOAL #2   Title  Patient will require min cueing for STS transfer with CGA for increased independence with mobility.     Baseline  performs with CGA.     Time  2    Period  Weeks    Status  Achieved      PT SHORT TERM GOAL #3   Title  Patient will maintain upright posture for > 15 seconds to demonstrate strengthened postural control muscualture     Baseline  18 seconds     Time  2    Period  Weeks    Status  Achieved        PT Long Term Goals - 04/02/17 1610      PT LONG TERM GOAL #1   Title  Patient will perform bed mobility with Supervision to increase independent mobility    Baseline  Requires Min A for LE.     Time  8    Period  Weeks    Status  Partially Met      PT LONG TERM GOAL #2   Title  Patient (< 88 years old) will complete five times sit to stand test in < 10 seconds indicating an increased LE strength and improved balance.    Baseline  32 seconds from plinth with single UE on seat and single on walker.     Time  8    Period  Weeks    Status  Partially Met      PT LONG TERM GOAL #3   Title  Patient will ambulate 40 ft with walker with no breaks to allow for increased mobility within home.     Baseline  12/26: 29 ft with walker and WC follow with no rest breaks    Time  8    Period  Weeks    Status  Partially Met      PT LONG TERM GOAL #4   Title  Patient will increase BLE gross strength to 4+/5 as to improve functional strength for independent gait, increased standing tolerance and increased ADL ability.    Baseline  3+/5 to 4-/5 RLE    Time  8    Period  Weeks    Status  Partially Met      PT LONG  TERM GOAL #5   Title  Patient will increase lower extremity functional scale to >40/80 to demonstrate improved functional mobility and increased tolerance with ADLs.     Baseline  14/80; 12/26: 21/80    Time  8    Period  Weeks    Status  Partially Met       Additional Long Term Goals   Additional Long Term Goals  Yes      PT LONG TERM GOAL #6   Title  Patient will walk through a door involving one small step up/down to increase functional independence.     Baseline  unable to step up     Time  8    Period  Weeks    Status  New    Target Date  05/28/17            Plan - 05/21/17 1443    Clinical Impression Statement  Patient educated on ambulation with hemiwalker, utilizing hemiwalker for five steps and marching in // bars. Patient prefers use of quad cane for ambulation/stability. Patient is challenged in upright positioning due to excessive trunk flexion that is exacerbated by bilateral tight hip flexors. Patient will continue to benefit form skilled physical therapy to improve strength balance and gait safety.     Rehab Potential  Fair    Clinical Impairments Affecting Rehab Potential  hx of HTN, HLD, CVA, learning disability, Diabetes, brain tumor,     PT Frequency  2x / week    PT Duration  8 weeks    PT Treatment/Interventions  ADLs/Self Care Home Management;Aquatic Therapy;Ultrasound;Moist Heat;Traction;DME Instruction;Gait training;Stair training;Functional mobility training;Therapeutic activities;Therapeutic exercise;Orthotic Fit/Training;Neuromuscular re-education;Balance training;Patient/family education;Manual techniques;Wheelchair mobility training;Passive range of motion;Energy conservation;Taping;Visual/perceptual remediation/compensation    PT Next Visit Plan  bed mobility, standing strengthening interventions    PT Home Exercise Plan  see sheet    Consulted and Agree with Plan of Care  Patient;Family member/caregiver    Family Member Consulted  mother       Patient will benefit from skilled therapeutic intervention in order to improve the following deficits and impairments:  Abnormal gait, Decreased activity tolerance, Decreased balance, Decreased knowledge of precautions, Decreased endurance, Decreased coordination,  Decreased knowledge of use of DME, Decreased mobility, Decreased range of motion, Difficulty walking, Decreased safety awareness, Decreased strength, Impaired flexibility, Impaired perceived functional ability, Impaired tone, Postural dysfunction, Improper body mechanics, Pain  Visit Diagnosis: Muscle weakness (generalized)  Other lack of coordination  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 Acute CVA (cerebrovascular accident) ([place]) 05/02/2016  . Left-sided weakness 05/01/2016   Janna Arch, PT, DPT   Janna Arch 05/21/2017, 2:48 PM  [place] MAIN [facility] SERVICES [address] [place], [place], [address] Phone: [phone]   Fax:  [phone]  Name: Curtis Powell MRN: [id number] Date of Birth: [birth year]

## 2017-05-26 ENCOUNTER — Encounter: Payer: Self-pay | Admitting: Occupational Therapy

## 2017-05-26 ENCOUNTER — Ambulatory Visit: Payer: Medicare HMO

## 2017-05-26 ENCOUNTER — Ambulatory Visit: Payer: Medicare HMO | Admitting: Occupational Therapy

## 2017-05-26 DIAGNOSIS — M6281 Muscle weakness (generalized): Secondary | ICD-10-CM

## 2017-05-26 DIAGNOSIS — I69351 Hemiplegia and hemiparesis following cerebral infarction affecting right dominant side: Secondary | ICD-10-CM

## 2017-05-26 DIAGNOSIS — R278 Other lack of coordination: Secondary | ICD-10-CM

## 2017-05-26 DIAGNOSIS — R2689 Other abnormalities of gait and mobility: Secondary | ICD-10-CM

## 2017-05-26 NOTE — Therapy (Signed)
[place] MAIN [facility] SERVICES [address] [place], [place], [address] Phone: [phone]   Fax:  [phone]  Physical Therapy Treatment  Patient Details  Name: Curtis Powell MRN: [id number] Date of Birth: Mar 18, 1966 Referring Provider: Dr. Larinda Buttery   Encounter Date: 05/26/2017  PT End of Session - 05/26/17 1545    Visit Number  21    Number of Visits  48    Date for PT Re-Evaluation  07/21/17    PT Start Time  1497    PT Stop Time  1558    PT Time Calculation (min)  43 min    Equipment Utilized During Treatment  Gait belt    Activity Tolerance  Patient tolerated treatment well    Behavior During Therapy  [facility] for tasks assessed/performed       Past Medical History:  Diagnosis Date  . GERD (gastroesophageal reflux disease)   . Hyperlipidemia   . Hypertension   . Obesity   . Stroke [facility])     Past Surgical History:  Procedure Laterality Date  . BRAIN SURGERY      There were no vitals filed for this visit.  Subjective Assessment - 05/26/17 1517    Subjective  Patient reports walking this morning to kitchen, is becoming more independent in bathroom.  Wants to continue progressing towards more independent ambulation.     Pertinent History   Patient is a pleasant 52 year old male who presents to physical therapy for weakness and immobility secondary to CVA.  Had a stroke in Feb 2018. He was previously fully independent, but this stroke caused severe residual deficits, mainly on the right side as well as speech, and now he is unable to walk or perform most of his ADLs on his own. Entire right side is very weak. He still has a little difficulty with speech but his swallowing is improved to baseline. His mom had to move in with him and now is his main caregiver.     Limitations  Sitting;Lifting;Standing;Walking;House hold activities    How long can you sit comfortably?  5 minutes    How long can you stand comfortably?  5  minutes    How long can you walk comfortably?  10 ft    Patient Stated Goals  Walk without walker, walk further with walker, get some strength in back     Currently in Pain?  No/denies       STS Upright posture: >30 seconds standing 5x STS: 30 seconds with Collie Siad on walker and single UE on plinth 40 ft ambulate LEFS: 28/80 Door involving small step up down : require Min A   Right Left  Hip flexion 4-/5 4-/5  Hip Abduction 4-/5 4/5  Hip Adduction 3+/5 4/5  Knee Extension  4/5 4/5  Knee Flexion 4/5 4/5  DF 3+/5 4/5  PF 4-/5 4/5    Walking with LUE only in // bars CGA 2x  , UE fatigue, excessive trunk flexion, when increased neutral position of trunk has posterior LOB, noticing excessive hip flexor tightness  Lunge stretch in // bars to stretch hip flexors 2x 30 seconds each leg    Marching with quad cane in LUE. And finger tips of RUE. 2x 2 minutes  Ambulating backwards in// bars, CGA, 2x 8 ft   Pt. response to medical necessity:  Patient will continue to benefit from skilled physical therapy to improve strength balance and gait safety.  PT Education - 05/26/17 1544    Education provided  Yes    Education Details  POC, hip flexor standing stretch, LE strength and mobility with ambulation     Person(s) Educated  Patient    Methods  Explanation;Demonstration;Verbal cues    Comprehension  Verbalized understanding;Returned demonstration;Verbal cues required       PT Short Term Goals - 05/26/17 1514      PT SHORT TERM GOAL #1   Title  Patient will be independent in home exercise program to improve strength/mobility for better functional independence with ADLs.    Baseline  hep compliant    Time  2    Period  Weeks    Status  Achieved      PT SHORT TERM GOAL #2   Title  Patient will require min cueing for STS transfer with CGA for increased independence with mobility.     Baseline  CGA    Time  2    Period  Weeks    Status   Achieved      PT SHORT TERM GOAL #3   Title  Patient will maintain upright posture for > 15 seconds to demonstrate strengthened postural control muscualture     Baseline  18 seconds     Time  2    Period  Weeks    Status  Achieved        PT Long Term Goals - 05/26/17 1518      PT LONG TERM GOAL #1   Title  Patient will perform bed mobility with Supervision to increase independent mobility    Baseline  Requires Min A for LE for Sit to Supine, independent with bridging to scoot within bed to L and R    Time  8    Period  Weeks    Status  Partially Met      PT LONG TERM GOAL #2   Title  Patient (< 83 years old) will complete five times sit to stand test in < 10 seconds indicating an increased LE strength and improved balance.    Baseline  2/18: 30 seconds from plinth chair height single UE on seat and single on walker ; 32 seconds from plinth with single UE on seat and single on walker.     Time  8    Period  Weeks    Status  Partially Met      PT LONG TERM GOAL #3   Title  Patient will ambulate 40 ft with walker with no breaks to allow for increased mobility within home.     Baseline  12/26: 29 ft with walker and WC follow with no rest breaks; 2/18: multiple trials previous session of >100 ft     Time  8    Period  Weeks    Status  Achieved      PT LONG TERM GOAL #4   Title  Patient will increase BLE gross strength to 4+/5 as to improve functional strength for independent gait, increased standing tolerance and increased ADL ability.    Baseline  2/18: 4-/5 RLE,     Time  8    Period  Weeks    Status  Partially Met      PT LONG TERM GOAL #5   Title  Patient will increase lower extremity functional scale to >40/80 to demonstrate improved functional mobility and increased tolerance with ADLs.     Baseline  14/80; 12/26: 21/80; 2/18: 28/80     Time  8    Period  Weeks    Status  Partially Met      Additional Long Term Goals   Additional Long Term Goals  Yes      PT LONG  TERM GOAL #6   Title  Patient will walk through a door involving one small step up/down to increase functional independence.     Baseline  require Min A and BUE support in // bars at this time    Time  8    Period  Weeks    Status  Partially Met      PT LONG TERM GOAL #7   Title  Patient will ambulate 40 ft with least assistive device and Supervision with no breaks to allow for increased mobility within home.     Baseline  requires use of walker and CGA    Time  8    Period  Weeks    Status  New            Plan - 05/26/17 1808    Clinical Impression Statement  Patient progressing towards goals at this time with improved LE strength and increased duration of ambulatory capacity. Patient LE strength improving with functional mobility, transfers, and strength. Patient is challenged in upright posture due to excessive trunk flexion that is exacerbated by bilateral tight hip flexors. 5x STS= 30 seconds, LEFS=28/80, able to maintain upright posture >30 seconds in standing, has ambulated in previous sessions >100 ft with RW and CGA.  Patient will continue to benefit from skilled physical therapy to improve strength balance and gait safety.    Rehab Potential  Fair    Clinical Impairments Affecting Rehab Potential  hx of HTN, HLD, CVA, learning disability, Diabetes, brain tumor,     PT Frequency  2x / week    PT Duration  8 weeks    PT Treatment/Interventions  ADLs/Self Care Home Management;Aquatic Therapy;Ultrasound;Moist Heat;Traction;DME Instruction;Gait training;Stair training;Functional mobility training;Therapeutic activities;Therapeutic exercise;Orthotic Fit/Training;Neuromuscular re-education;Balance training;Patient/family education;Manual techniques;Wheelchair mobility training;Passive range of motion;Energy conservation;Taping;Visual/perceptual remediation/compensation    PT Next Visit Plan  bed mobility, standing strengthening interventions    PT Home Exercise Plan  see sheet     Consulted and Agree with Plan of Care  Patient;Family member/caregiver    Family Member Consulted  mother       Patient will benefit from skilled therapeutic intervention in order to improve the following deficits and impairments:  Abnormal gait, Decreased activity tolerance, Decreased balance, Decreased knowledge of precautions, Decreased endurance, Decreased coordination, Decreased knowledge of use of DME, Decreased mobility, Decreased range of motion, Difficulty walking, Decreased safety awareness, Decreased strength, Impaired flexibility, Impaired perceived functional ability, Impaired tone, Postural dysfunction, Improper body mechanics, Pain  Visit Diagnosis: Muscle weakness (generalized)  Other lack of coordination  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 Acute CVA (cerebrovascular accident) ([place]) 05/02/2016  . Left-sided weakness 05/01/2016   Janna Arch, PT, DPT   Janna Arch 05/26/2017, 6:09 PM  [place] MAIN [facility] SERVICES [address] [place], [place], [address] Phone: [phone]   Fax:  [phone]  Name: Curtis Powell MRN: [id number] Date of Birth: [birth year]

## 2017-05-26 NOTE — Therapy (Signed)
[place] MAIN [facility] SERVICES [address] [place], [place], [address] Phone: [phone]   Fax:  [phone]  Occupational Therapy Treatment  Patient Details  Name: Curtis Powell MRN: [id number] Date of Birth: 06-17-1965 Referring Provider: Dr. Larinda Buttery   Encounter Date: 05/26/2017  OT End of Session - 05/26/17 1442    Visit Number  18    Number of Visits  24    Date for OT Re-Evaluation  07/30/17    OT Start Time  9924    OT Stop Time  1515    OT Time Calculation (min)  38 min    Activity Tolerance  Patient tolerated treatment well    Behavior During Therapy  [facility] for tasks assessed/performed       Past Medical History:  Diagnosis Date  . GERD (gastroesophageal reflux disease)   . Hyperlipidemia   . Hypertension   . Obesity   . Stroke [facility])     Past Surgical History:  Procedure Laterality Date  . BRAIN SURGERY      There were no vitals filed for this visit.  Subjective Assessment - 05/26/17 1439    Subjective   Pt. reports his right shoulder is intermittently painful.    Patient is accompained by:  Family member    Patient Stated Goals  To be able to throw a ball and dribble a ball.    Currently in Pain?  No/denies    Pain Score  8     Pain Location  Shoulder    Pain Orientation  Right    Pain Descriptors / Indicators  Aching;Shooting    Pain Type  Acute pain      OT TREATMENT    Self-care:  Pt. Worked on toileting skills. Pt. Required CGA for clothing negotiation skills. Pt. Required CGA standing at the bathroom bar, and using his personal urinal.  Therapeutic Exercise:  Pt. Worked on UE there. Ex. With red theraband for Left shoulder flexion, abduction for 1 set 20 reps each. Bilateral elbow flexion, and extension fpr 2 sets 20 reps each. Pt tolerated the exercises well.                           OT Education - 05/26/17 1440    Education provided  Yes    Education  Details  LUE strength, coordination    Person(s) Educated  Patient    Methods  Explanation;Demonstration;Verbal cues;Handout    Comprehension  Verbalized understanding;Returned demonstration;Verbal cues required;Tactile cues required          OT Long Term Goals - 05/12/17 1705      OT LONG TERM GOAL #1   Title  Pt. will UE functioning to be able to perfrom toilet hygiene with minimal assistance.    Baseline  Pt. is unable. 05/07/2017: Pt. continues to occassionally have difficulty.    Time  12    Period  Weeks    Status  On-going    Target Date  07/30/17      OT LONG TERM GOAL #2   Title  Pt. will complete self grooming with minA    Baseline  Pt. requires extensive assist. Pt. is improving with oral care with set-up, however requires extensive assist for self-grooming tasls.    Time  12    Period  Weeks    Status  On-going    Target Date  07/30/17      OT  LONG TERM GOAL #3   Title  Pt. will perfrom self-feeding skills with modified indepndence    Baseline  Pt. has difficutly, and performs with increased spillage 05/07/2017: Pt. has difficulty with occassionally spilling.    Time  12    Period  Weeks    Status  On-going    Target Date  07/30/17      OT LONG TERM GOAL #4   Title  Pt. will perform self dressing with minA and A/E as needed.    Baseline  Pt. requires extensive assist: Pt. conitnues to have difficulty perfroming UE,a nd LE dressing.    Time  12    Period  Weeks    Status  On-going    Target Date  07/30/17      OT LONG TERM GOAL #5   Title  Pt. will perfrom light home making tasks with minA    Baseline  Pt. requires extensive assistance 05/07/2017: Pt. continues to have difficulty folding clothes. Pt. is improving with making a sandwich.    Time  12    Period  Weeks    Status  On-going    Target Date  07/30/17      OT LONG TERM GOAL #6   Title  Pt. will write his name efficiently with 100% legibility    Baseline  50% legibility with incresed time required  04/27/2017: Pt. continues to present with difficulty writing legibly.     Time  12    Period  Weeks    Status  On-going    Target Date  07/30/17            Plan - 05/26/17 1443    Clinical Impression Statement  Pt. reports missing his mother's birthday party this past weekend because ther was nobody to assist him. Pt. reports having another fall reaching into a cabinet. Pt. continues to have right shoulder pain. Pt. continues to work on UE functioning.   Occupational performance deficits (Please refer to evaluation for details):  ADL's;IADL's    Rehab Potential  Fair    OT Frequency  2x / week    OT Duration  12 weeks    OT Treatment/Interventions  Self-care/ADL training;Patient/family education;DME and/or AE instruction;Therapeutic exercise;Moist Heat    Clinical Decision Making  Several treatment options, min-mod task modification necessary    Consulted and Agree with Plan of Care  Patient       Patient will benefit from skilled therapeutic intervention in order to improve the following deficits and impairments:  Pain, Decreased cognition, Decreased activity tolerance, Impaired UE functional use, Decreased range of motion, Decreased coordination, Decreased endurance  Visit Diagnosis: Muscle weakness (generalized)    Problem List Patient Active Problem List   Diagnosis Date Noted  . Acute CVA (cerebrovascular accident) ([place]) 05/02/2016  . Left-sided weakness 05/01/2016    Harrel Carina, MS, OTR/L 05/26/2017, 3:19 PM  [place] MAIN [facility] SERVICES [address] [place], [place], [address] Phone: [phone]   Fax:  [phone]  Name: Curtis Powell MRN: [id number] Date of Birth: Dec 10, 1965

## 2017-05-28 ENCOUNTER — Ambulatory Visit: Payer: Medicare HMO

## 2017-05-28 ENCOUNTER — Ambulatory Visit: Payer: Medicare HMO | Admitting: Occupational Therapy

## 2017-05-28 ENCOUNTER — Encounter: Payer: Self-pay | Admitting: Occupational Therapy

## 2017-05-28 DIAGNOSIS — R2689 Other abnormalities of gait and mobility: Secondary | ICD-10-CM

## 2017-05-28 DIAGNOSIS — I69351 Hemiplegia and hemiparesis following cerebral infarction affecting right dominant side: Secondary | ICD-10-CM

## 2017-05-28 DIAGNOSIS — M6281 Muscle weakness (generalized): Secondary | ICD-10-CM

## 2017-05-28 DIAGNOSIS — R278 Other lack of coordination: Secondary | ICD-10-CM

## 2017-05-28 NOTE — Therapy (Signed)
[place] MAIN [facility] SERVICES [address] [place], [place], [address] Phone: [phone]   Fax:  [phone]  Physical Therapy Treatment  Patient Details  Name: Curtis Powell MRN: [id number] Date of Birth: [birth year] Referring Provider: Dr. Larinda Buttery   Encounter Date: 05/28/2017  PT End of Session - 05/28/17 1437    Visit Number  22    Number of Visits  48    Date for PT Re-Evaluation  07/21/17    PT Start Time  1430    PT Stop Time  1515    PT Time Calculation (min)  45 min    Equipment Utilized During Treatment  Gait belt    Activity Tolerance  Patient tolerated treatment well    Behavior During Therapy  [facility] for tasks assessed/performed       Past Medical History:  Diagnosis Date  . GERD (gastroesophageal reflux disease)   . Hyperlipidemia   . Hypertension   . Obesity   . Stroke [facility])     Past Surgical History:  Procedure Laterality Date  . BRAIN SURGERY      There were no vitals filed for this visit.  Subjective Assessment - 05/28/17 1435    Subjective  Patient reports walking to kitchen and back to room but that is it. Reports aide doesn't walk him as much during the week. Aide is only there for 3 hours a day.     Pertinent History   Patient is a pleasant 52 year old male who presents to physical therapy for weakness and immobility secondary to CVA.  Had a stroke in Feb 2018. He was previously fully independent, but this stroke caused severe residual deficits, mainly on the right side as well as speech, and now he is unable to walk or perform most of his ADLs on his own. Entire right side is very weak. He still has a little difficulty with speech but his swallowing is improved to baseline. His mom had to move in with him and now is his main caregiver.     Limitations  Sitting;Lifting;Standing;Walking;House hold activities    How long can you sit comfortably?  5 minutes    How long can you stand comfortably?  5  minutes    How long can you walk comfortably?  10 ft    Patient Stated Goals  Walk without walker, walk further with walker, get some strength in back     Currently in Pain?  Yes    Pain Score  8     Pain Location  Shoulder    Pain Orientation  Right    Pain Descriptors / Indicators  Aching;Shooting    Pain Type  Acute pain    Pain Frequency  Intermittent        Nustep LE only lvl 3 4 minutes   Ambulate 40 ft with walker to and from // bars CGA.   Walking with LUE only in // bars CGA 2x length of bars  , UE fatigue, excessive trunk flexion, when increased neutral position of trunk has posterior LOB, noticing excessive hip flexor tightness   Lunge stretch in // bars to stretch hip flexors 8x 20 seconds each leg. Moving forward though // bars      Marching with quad cane in LUE. And finger tips of RUE. 2 minutes, Cues to push down into cane rather than laterally for support.   Side stepping in // bars 2x length of bars with BUE support ;  challenge with upright posture   Standing flexion 10x BLE with BUE support Standing extension 10x BLE with BUE support  Ambulating backwards in// bars, CGA, 2x 8 ft   Seated abduction GTB 20x   Bathroom mobility CGA.    Pt. response to medical necessity:  Patient will continue to benefit from skilled physical therapy to improve strength balance and gait safety.                       PT Education - 05/28/17 1436    Education provided  Yes    Education Details  LE strength and coordination, ambulatory skills     Person(s) Educated  Patient    Methods  Explanation;Demonstration;Verbal cues    Comprehension  Verbalized understanding;Returned demonstration       PT Short Term Goals - 05/26/17 1514      PT SHORT TERM GOAL #1   Title  Patient will be independent in home exercise program to improve strength/mobility for better functional independence with ADLs.    Baseline  hep compliant    Time  2    Period  Weeks     Status  Achieved      PT SHORT TERM GOAL #2   Title  Patient will require min cueing for STS transfer with CGA for increased independence with mobility.     Baseline  CGA    Time  2    Period  Weeks    Status  Achieved      PT SHORT TERM GOAL #3   Title  Patient will maintain upright posture for > 15 seconds to demonstrate strengthened postural control muscualture     Baseline  18 seconds     Time  2    Period  Weeks    Status  Achieved        PT Long Term Goals - 05/26/17 1518      PT LONG TERM GOAL #1   Title  Patient will perform bed mobility with Supervision to increase independent mobility    Baseline  Requires Min A for LE for Sit to Supine, independent with bridging to scoot within bed to L and R    Time  8    Period  Weeks    Status  Partially Met      PT LONG TERM GOAL #2   Title  Patient (< 61 years old) will complete five times sit to stand test in < 10 seconds indicating an increased LE strength and improved balance.    Baseline  2/18: 30 seconds from plinth chair height single UE on seat and single on walker ; 32 seconds from plinth with single UE on seat and single on walker.     Time  8    Period  Weeks    Status  Partially Met      PT LONG TERM GOAL #3   Title  Patient will ambulate 40 ft with walker with no breaks to allow for increased mobility within home.     Baseline  12/26: 29 ft with walker and WC follow with no rest breaks; 2/18: multiple trials previous session of >100 ft     Time  8    Period  Weeks    Status  Achieved      PT LONG TERM GOAL #4   Title  Patient will increase BLE gross strength to 4+/5 as to improve functional strength for independent gait, increased standing tolerance and  increased ADL ability.    Baseline  2/18: 4-/5 RLE,     Time  8    Period  Weeks    Status  Partially Met      PT LONG TERM GOAL #5   Title  Patient will increase lower extremity functional scale to >40/80 to demonstrate improved functional mobility and  increased tolerance with ADLs.     Baseline  14/80; 12/26: 21/80; 2/18: 28/80     Time  8    Period  Weeks    Status  Partially Met      Additional Long Term Goals   Additional Long Term Goals  Yes      PT LONG TERM GOAL #6   Title  Patient will walk through a door involving one small step up/down to increase functional independence.     Baseline  require Min A and BUE support in // bars at this time    Time  8    Period  Weeks    Status  Partially Met      PT LONG TERM GOAL #7   Title  Patient will ambulate 40 ft with least assistive device and Supervision with no breaks to allow for increased mobility within home.     Baseline  requires use of walker and CGA    Time  8    Period  Weeks    Status  New            Plan - 05/28/17 1631    Clinical Impression Statement  Patient performed standing interventions with improved step length and upright posture demonstrating increased hip flexor tissue length and improved muscular strength. Patient is challenged in standing positions with prolonged positioning and will continue to benefit from standing interventions.  Patient will continue to benefit from skilled physical therapy to improve strength balance and gait safety.    Rehab Potential  Fair    Clinical Impairments Affecting Rehab Potential  hx of HTN, HLD, CVA, learning disability, Diabetes, brain tumor,     PT Frequency  2x / week    PT Duration  8 weeks    PT Treatment/Interventions  ADLs/Self Care Home Management;Aquatic Therapy;Ultrasound;Moist Heat;Traction;DME Instruction;Gait training;Stair training;Functional mobility training;Therapeutic activities;Therapeutic exercise;Orthotic Fit/Training;Neuromuscular re-education;Balance training;Patient/family education;Manual techniques;Wheelchair mobility training;Passive range of motion;Energy conservation;Taping;Visual/perceptual remediation/compensation    PT Next Visit Plan  bed mobility, standing strengthening interventions     PT Home Exercise Plan  see sheet    Consulted and Agree with Plan of Care  Patient;Family member/caregiver    Family Member Consulted  mother       Patient will benefit from skilled therapeutic intervention in order to improve the following deficits and impairments:  Abnormal gait, Decreased activity tolerance, Decreased balance, Decreased knowledge of precautions, Decreased endurance, Decreased coordination, Decreased knowledge of use of DME, Decreased mobility, Decreased range of motion, Difficulty walking, Decreased safety awareness, Decreased strength, Impaired flexibility, Impaired perceived functional ability, Impaired tone, Postural dysfunction, Improper body mechanics, Pain  Visit Diagnosis: Muscle weakness (generalized)  Other lack of coordination  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 Acute CVA (cerebrovascular accident) ([place]) 05/02/2016  . Left-sided weakness 05/01/2016   Janna Arch, PT, DPT   Janna Arch 05/28/2017, 4:31 PM  [place] MAIN [facility] SERVICES [address] [place], [place], [address]  Phone: [phone]   Fax:  [phone]  Name: THEODIS KINSEL MRN: [id number] Date of Birth: Oct 01, 1965

## 2017-05-28 NOTE — Therapy (Signed)
[place] MAIN [facility] SERVICES [address] [place], [place], [address] Phone: [phone]   Fax:  [phone]  Occupational Therapy Treatment  Patient Details  Name: Curtis Powell MRN: [id number] Date of Birth: Dec 02, 1965 Referring Provider: Dr. Larinda Buttery   Encounter Date: 05/28/2017  OT End of Session - 05/28/17 1355    Visit Number  19    Number of Visits  24    Date for OT Re-Evaluation  07/30/17    OT Start Time  2122    OT Stop Time  1430    OT Time Calculation (min)  42 min    Activity Tolerance  Patient tolerated treatment well    Behavior During Therapy  [facility] for tasks assessed/performed       Past Medical History:  Diagnosis Date  . GERD (gastroesophageal reflux disease)   . Hyperlipidemia   . Hypertension   . Obesity   . Stroke [facility])     Past Surgical History:  Procedure Laterality Date  . BRAIN SURGERY      There were no vitals filed for this visit.  Subjective Assessment - 05/28/17 1352    Subjective   Pt. has right shoulder pain.    Patient is accompained by:  Family member    Pertinent History  Pt. is a 52 y.o. male who suffered a CVA on 05/01/2016. Pt. was admitted to the hospital. Once discharged, he received Home Health PT and OT services for about a month. Pt. has had multiple CVAs over the past 8 years, and has had multiple falls in the past 6 months. Pt. resides in an apartment. Pt. has caregivers for 80 hours. Pt.'s mother stays with pt. at night, and assists with IADL tasks.      Pain Score  7       OT Treatment   Therapeutic Exercise:  Pt. worked on UE there. Ex. With red theraband for Left shoulder flexion, abduction for 1 set 20 reps each. Bilateral elbow flexion, and extension fpr 2 sets 20 reps each. Pt tolerated the exercises well.  Neuromuscular Re-education  Pt. performed [facility] tasks using the Grooved pegboard. Pt. worked on grasping the grooved pegs from a horizontal position,  and moving the pegs to a vertical position in the hand to prepare for placing them in the grooved slot.  Pt. Worked on grasping them one at a time.                       OT Education - 05/28/17 1354    Education provided  Yes    Education Details  UE strength, and coordination skills.    Person(s) Educated  Patient    Methods  Demonstration;Explanation;Verbal cues    Comprehension  Verbalized understanding;Returned demonstration;Verbal cues required          OT Long Term Goals - 05/28/17 1404      OT LONG TERM GOAL #1   Title  Pt. will UE functioning to be able to perfrom toilet hygiene with minimal assistance.    Baseline  Pt. is unable. 05/07/2017: Pt. continues to occassionally have difficulty.    Time  12    Period  Weeks    Status  On-going    Target Date  07/30/17      OT LONG TERM GOAL #2   Title  Pt. will complete self grooming with minA    Baseline  Pt. requires extensive assist. Pt. is improving  with oral care with set-up, however requires extensive assist for self-grooming tasls.    Time  12    Period  Weeks    Status  On-going    Target Date  07/30/17      OT LONG TERM GOAL #3   Title  Pt. will perfrom self-feeding skills with modified indepndence    Baseline  Pt. has difficutly, and performs with increased spillage 05/07/2017: Pt. has difficulty with occassionally spilling.    Time  12    Period  Weeks    Status  On-going    Target Date  07/30/17      OT LONG TERM GOAL #4   Title  Pt. will perform self dressing with minA and A/E as needed.    Baseline  Pt. requires extensive assist: Pt. conitnues to have difficulty perfroming UE,a nd LE dressing.    Time  12    Period  Weeks    Status  On-going    Target Date  07/30/17      OT LONG TERM GOAL #5   Title  Pt. will perfrom light home making tasks with minA    Baseline  Pt. requires extensive assistance 05/07/2017: Pt. continues to have difficulty folding clothes. Pt. is improving with  making a sandwich.    Time  12    Period  Weeks    Status  On-going    Target Date  06/01/17      OT LONG TERM GOAL #6   Title  Pt. will write his name efficiently with 100% legibility    Baseline  50% legibility with incresed time required 04/27/2017: Pt. continues to present with difficulty writing legibly.     Time  12    Period  Weeks    Status  On-going    Target Date  07/30/17            Plan - 05/28/17 1356    Clinical Impression Statement  Pt. presents with right shoulder pain. Pt. reports not having any more falls this week. Pt. continues to present limited UE strength, and coordination skills. Pt. is tolerting the exercises well.    Occupational performance deficits (Please refer to evaluation for details):  ADL's;IADL's    Rehab Potential  Fair    OT Frequency  2x / week    OT Duration  12 weeks    OT Treatment/Interventions  Self-care/ADL training;Patient/family education;DME and/or AE instruction;Therapeutic exercise;Moist Heat    Clinical Decision Making  Several treatment options, min-mod task modification necessary    Consulted and Agree with Plan of Care  Patient       Patient will benefit from skilled therapeutic intervention in order to improve the following deficits and impairments:  Pain, Decreased cognition, Decreased activity tolerance, Impaired UE functional use, Decreased range of motion, Decreased coordination, Decreased endurance  Visit Diagnosis: Muscle weakness (generalized)  Other lack of coordination    Problem List Patient Active Problem List   Diagnosis Date Noted  . Acute CVA (cerebrovascular accident) ([place]) 05/02/2016  . Left-sided weakness 05/01/2016    Harrel Carina, MS, OTR/L 05/28/2017, 2:09 PM  [place] MAIN [facility] SERVICES [address] [place], [place], [address] Phone: [phone]   Fax:  [phone]  Name: Curtis Powell MRN: [id number] Date of Birth: 1965-05-06

## 2017-06-02 ENCOUNTER — Ambulatory Visit: Payer: Medicare HMO

## 2017-06-02 ENCOUNTER — Ambulatory Visit: Payer: Medicare HMO | Admitting: Occupational Therapy

## 2017-06-02 ENCOUNTER — Encounter: Payer: Self-pay | Admitting: Occupational Therapy

## 2017-06-02 DIAGNOSIS — R278 Other lack of coordination: Secondary | ICD-10-CM

## 2017-06-02 DIAGNOSIS — R2689 Other abnormalities of gait and mobility: Secondary | ICD-10-CM

## 2017-06-02 DIAGNOSIS — I69351 Hemiplegia and hemiparesis following cerebral infarction affecting right dominant side: Secondary | ICD-10-CM

## 2017-06-02 DIAGNOSIS — M6281 Muscle weakness (generalized): Secondary | ICD-10-CM

## 2017-06-02 NOTE — Therapy (Signed)
[place] MAIN [facility] SERVICES [address] [place], [place], [address] Phone: [phone]   Fax:  [phone]  Physical Therapy Treatment  Patient Details  Name: Curtis Powell MRN: [id number] Date of Birth: 02-20-1966 Referring Provider: Dr. Larinda Buttery   Encounter Date: 06/02/2017  PT End of Session - 06/02/17 1526    Visit Number  23    Number of Visits  48    Date for PT Re-Evaluation  07/21/17    PT Start Time  4818    PT Stop Time  1515    PT Time Calculation (min)  44 min    Equipment Utilized During Treatment  Gait belt    Activity Tolerance  Patient tolerated treatment well    Behavior During Therapy  [facility] for tasks assessed/performed       Past Medical History:  Diagnosis Date  . GERD (gastroesophageal reflux disease)   . Hyperlipidemia   . Hypertension   . Obesity   . Stroke [facility])     Past Surgical History:  Procedure Laterality Date  . BRAIN SURGERY      There were no vitals filed for this visit.  Subjective Assessment - 06/02/17 1438    Subjective  Patient reports fall since last session when he was standing and reaching for crackers in the cupboard, fell foward. Reports no increase in pain. Has mild shoulder pain.     Pertinent History   Patient is a pleasant 52 year old male who presents to physical therapy for weakness and immobility secondary to CVA.  Had a stroke in Feb 2018. He was previously fully independent, but this stroke caused severe residual deficits, mainly on the right side as well as speech, and now he is unable to walk or perform most of his ADLs on his own. Entire right side is very weak. He still has a little difficulty with speech but his swallowing is improved to baseline. His mom had to move in with him and now is his main caregiver.     Limitations  Sitting;Lifting;Standing;Walking;House hold activities    How long can you sit comfortably?  5 minutes    How long can you stand  comfortably?  5 minutes    How long can you walk comfortably?  10 ft    Patient Stated Goals  Walk without walker, walk further with walker, get some strength in back     Currently in Pain?  Yes    Pain Score  5     Pain Location  Shoulder    Pain Orientation  Right    Pain Descriptors / Indicators  Aching;Stabbing    Pain Type  Acute pain    Pain Onset  More than a month ago    Pain Frequency  Intermittent    Aggravating Factors   weight  bearing        Nustep LE only lvl 3 4 minutes    Ambulate 86 ft with walker  Around gym. Cues for walking with increased hip hike (knees to ceiling" ) and heel strike. CGA with WC follow   BUE support Step over and back therEx with cue for heel strike 10x each leg, required extra reps due to not stepping fully over line.   Standing without UE support reaching for ball to transfer down and up tree : from lower level to second rung switching between L and R arm.    Standing flexion 10x BLE with BUE support Standing  extension 10x BLE with BUE support   Ambulating backwards in// bars, CGA, 2x 8 ft    seated gluteal squeezes 10x    Pt. response to medical necessity:  Patient will continue to benefit from skilled physical therapy to improve strength balance and gait safety.                     PT Education - 06/02/17 1525    Education provided  Yes    Education Details  LE strength and coordination, step length and ambulatory mechanics    Person(s) Educated  Patient    Methods  Explanation;Demonstration;Tactile cues;Verbal cues    Comprehension  Verbalized understanding;Returned demonstration;Verbal cues required       PT Short Term Goals - 05/26/17 1514      PT SHORT TERM GOAL #1   Title  Patient will be independent in home exercise program to improve strength/mobility for better functional independence with ADLs.    Baseline  hep compliant    Time  2    Period  Weeks    Status  Achieved      PT SHORT TERM GOAL #2    Title  Patient will require min cueing for STS transfer with CGA for increased independence with mobility.     Baseline  CGA    Time  2    Period  Weeks    Status  Achieved      PT SHORT TERM GOAL #3   Title  Patient will maintain upright posture for > 15 seconds to demonstrate strengthened postural control muscualture     Baseline  18 seconds     Time  2    Period  Weeks    Status  Achieved        PT Long Term Goals - 05/26/17 1518      PT LONG TERM GOAL #1   Title  Patient will perform bed mobility with Supervision to increase independent mobility    Baseline  Requires Min A for LE for Sit to Supine, independent with bridging to scoot within bed to L and R    Time  8    Period  Weeks    Status  Partially Met      PT LONG TERM GOAL #2   Title  Patient (< 51 years old) will complete five times sit to stand test in < 10 seconds indicating an increased LE strength and improved balance.    Baseline  2/18: 30 seconds from plinth chair height single UE on seat and single on walker ; 32 seconds from plinth with single UE on seat and single on walker.     Time  8    Period  Weeks    Status  Partially Met      PT LONG TERM GOAL #3   Title  Patient will ambulate 40 ft with walker with no breaks to allow for increased mobility within home.     Baseline  12/26: 29 ft with walker and WC follow with no rest breaks; 2/18: multiple trials previous session of >100 ft     Time  8    Period  Weeks    Status  Achieved      PT LONG TERM GOAL #4   Title  Patient will increase BLE gross strength to 4+/5 as to improve functional strength for independent gait, increased standing tolerance and increased ADL ability.    Baseline  2/18: 4-/5 RLE,  Time  8    Period  Weeks    Status  Partially Met      PT LONG TERM GOAL #5   Title  Patient will increase lower extremity functional scale to >40/80 to demonstrate improved functional mobility and increased tolerance with ADLs.     Baseline  14/80;  12/26: 21/80; 2/18: 28/80     Time  8    Period  Weeks    Status  Partially Met      Additional Long Term Goals   Additional Long Term Goals  Yes      PT LONG TERM GOAL #6   Title  Patient will walk through a door involving one small step up/down to increase functional independence.     Baseline  require Min A and BUE support in // bars at this time    Time  8    Period  Weeks    Status  Partially Met      PT LONG TERM GOAL #7   Title  Patient will ambulate 40 ft with least assistive device and Supervision with no breaks to allow for increased mobility within home.     Baseline  requires use of walker and CGA    Time  8    Period  Weeks    Status  New            Plan - 06/02/17 1526    Clinical Impression Statement  Patient initially challenged with step length bilaterally during ambulation with increased length upon verbal cueing for raising knee to ceiling and hitting heel of foot first. Use of visual cue in // bars additionally assisted in step progression.  Patient will continue to benefit from skilled physical therapy to improve strength balance and gait safety.    Rehab Potential  Fair    Clinical Impairments Affecting Rehab Potential  hx of HTN, HLD, CVA, learning disability, Diabetes, brain tumor,     PT Frequency  2x / week    PT Duration  8 weeks    PT Treatment/Interventions  ADLs/Self Care Home Management;Aquatic Therapy;Ultrasound;Moist Heat;Traction;DME Instruction;Gait training;Stair training;Functional mobility training;Therapeutic activities;Therapeutic exercise;Orthotic Fit/Training;Neuromuscular re-education;Balance training;Patient/family education;Manual techniques;Wheelchair mobility training;Passive range of motion;Energy conservation;Taping;Visual/perceptual remediation/compensation    PT Next Visit Plan  bed mobility, standing strengthening interventions    PT Home Exercise Plan  see sheet    Consulted and Agree with Plan of Care  Patient;Family  member/caregiver    Family Member Consulted  mother       Patient will benefit from skilled therapeutic intervention in order to improve the following deficits and impairments:  Abnormal gait, Decreased activity tolerance, Decreased balance, Decreased knowledge of precautions, Decreased endurance, Decreased coordination, Decreased knowledge of use of DME, Decreased mobility, Decreased range of motion, Difficulty walking, Decreased safety awareness, Decreased strength, Impaired flexibility, Impaired perceived functional ability, Impaired tone, Postural dysfunction, Improper body mechanics, Pain  Visit Diagnosis: Muscle weakness (generalized)  Other lack of coordination  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 Acute CVA (cerebrovascular accident) ([place]) 05/02/2016  . Left-sided weakness 05/01/2016   Janna Arch, PT, DPT   Janna Arch 06/02/2017, 3:27 PM  [place] MAIN [facility] SERVICES [address] [place], [place], [address] Phone: [phone]   Fax:  [phone]  Name: Curtis Powell MRN: [id number] Date  of Birth: [birth year]

## 2017-06-02 NOTE — Therapy (Addendum)
[place] MAIN [facility] SERVICES [address] [place], [place], [address] Phone: [phone]   Fax:  [phone]  Occupational Therapy Treatment  Patient Details  Name: Curtis Powell MRN: [id number] Date of Birth: 1965-06-08 Referring Provider: Dr. Larinda Buttery   Encounter Date: 06/02/2017  OT End of Session - 06/02/17 1524    Visit Number  20    Number of Visits  24    Date for OT Re-Evaluation  07/30/17    OT Start Time  1515    OT Stop Time  1600    OT Time Calculation (min)  45 min    Activity Tolerance  Patient tolerated treatment well    Behavior During Therapy  [facility] for tasks assessed/performed       Past Medical History:  Diagnosis Date  . GERD (gastroesophageal reflux disease)   . Hyperlipidemia   . Hypertension   . Obesity   . Stroke [facility])     Past Surgical History:  Procedure Laterality Date  . BRAIN SURGERY      There were no vitals filed for this visit.  Subjective Assessment - 06/02/17 1522    Subjective   Pt.     Patient is accompained by:  Family member    Pertinent History  Pt. is a 52 y.o. male who suffered a CVA on 05/01/2016. Pt. was admitted to the hospital. Once discharged, he received Home Health PT and OT services for about a month. Pt. has had multiple CVAs over the past 8 years, and has had multiple falls in the past 6 months. Pt. resides in an apartment. Pt. has caregivers for 80 hours. Pt.'s mother stays with pt. at night, and assists with IADL tasks.      Patient Stated Goals  To be able to throw a ball and dribble a ball.    Currently in Pain?  Yes       OT TREATMENT    Therapeutic Exercise:  2# dumbbell ex. for elbow flexion and extension,  2# for forearm supination/pronation, wrist flexion/extension, and radial deviation. Pt. requires rest breaks and verbal cues for proper technique.  Neuromuscular re-education:  Pt. worked on grasping, and flipping 2" large pegs on the [employer] placed at a tabletop surface.  Self-care:  Discussed reacher options.                      OT Education - 06/02/17 1522    Education provided  Yes    Education Details  LE strength, coordination skills    Person(s) Educated  Patient    Methods  Explanation;Demonstration;Verbal cues    Comprehension  Verbalized understanding;Returned demonstration       OT Short Term Goals - 03/03/17 1459      OT SHORT TERM GOAL #1   Title  `        OT Long Term Goals - 05/28/17 1404      OT LONG TERM GOAL #1   Title  Pt. will UE functioning to be able to perfrom toilet hygiene with minimal assistance.    Baseline  Pt. is unable. 05/07/2017: Pt. continues to occassionally have difficulty.    Time  12    Period  Weeks    Status  On-going    Target Date  07/30/17      OT LONG TERM GOAL #2   Title  Pt. will complete self grooming with minA    Baseline  Pt.  requires extensive assist. Pt. is improving with oral care with set-up, however requires extensive assist for self-grooming tasls.    Time  12    Period  Weeks    Status  On-going    Target Date  07/30/17      OT LONG TERM GOAL #3   Title  Pt. will perfrom self-feeding skills with modified indepndence    Baseline  Pt. has difficutly, and performs with increased spillage 05/07/2017: Pt. has difficulty with occassionally spilling.    Time  12    Period  Weeks    Status  On-going    Target Date  07/30/17      OT LONG TERM GOAL #4   Title  Pt. will perform self dressing with minA and A/E as needed.    Baseline  Pt. requires extensive assist: Pt. conitnues to have difficulty perfroming UE,a nd LE dressing.    Time  12    Period  Weeks    Status  On-going    Target Date  07/30/17      OT LONG TERM GOAL #5   Title  Pt. will perfrom light home making tasks with minA    Baseline  Pt. requires extensive assistance 05/07/2017: Pt. continues to have difficulty folding clothes. Pt. is improving with making a sandwich.     Time  12    Period  Weeks    Status  On-going    Target Date  06/01/17      OT LONG TERM GOAL #6   Title  Pt. will write his name efficiently with 100% legibility    Baseline  50% legibility with incresed time required 04/27/2017: Pt. continues to present with difficulty writing legibly.     Time  12    Period  Weeks    Status  On-going    Target Date  07/30/17            Plan - 06/02/17 1527    Clinical Impression Statement Pt. reports he would like to go to silver sneakers when he has finished.Pt. continues to present with limited UE strength, and coordination skills. Pt. continues to work on improving UE stength, and coordination skills for improved engagement in ADL tasks.    Occupational performance deficits (Please refer to evaluation for details):  ADL's;IADL's    Rehab Potential  Fair    OT Frequency  2x / week    OT Duration  12 weeks    OT Treatment/Interventions  Self-care/ADL training;Patient/family education;DME and/or AE instruction;Therapeutic exercise;Moist Heat    Clinical Decision Making  Several treatment options, min-mod task modification necessary    Consulted and Agree with Plan of Care  Patient       Patient will benefit from skilled therapeutic intervention in order to improve the following deficits and impairments:  Pain, Decreased cognition, Decreased activity tolerance, Impaired UE functional use, Decreased range of motion, Decreased coordination, Decreased endurance  Visit Diagnosis: Muscle weakness (generalized)  Other lack of coordination    Problem List Patient Active Problem List   Diagnosis Date Noted  . Acute CVA (cerebrovascular accident) ([place]) 05/02/2016  . Left-sided weakness 05/01/2016    Harrel Carina, MS, OTR/L 06/02/2017, 3:38 PM  [place] MAIN [facility] SERVICES [address] [place], [place], [address] Phone: [phone]   Fax:  [phone]  Name: Curtis Powell MRN:  [id number] Date of Birth: 20-Apr-1965

## 2017-06-04 ENCOUNTER — Ambulatory Visit: Payer: Medicare HMO

## 2017-06-04 ENCOUNTER — Ambulatory Visit: Payer: Medicare HMO | Admitting: Occupational Therapy

## 2017-06-09 ENCOUNTER — Ambulatory Visit: Payer: Medicare HMO

## 2017-06-09 ENCOUNTER — Ambulatory Visit: Payer: Medicare HMO | Admitting: Occupational Therapy

## 2017-06-11 ENCOUNTER — Ambulatory Visit: Payer: Medicare HMO | Admitting: Occupational Therapy

## 2017-06-11 ENCOUNTER — Encounter: Payer: Self-pay | Admitting: Occupational Therapy

## 2017-06-11 ENCOUNTER — Ambulatory Visit: Payer: Medicare HMO | Attending: Family Medicine

## 2017-06-11 DIAGNOSIS — R278 Other lack of coordination: Secondary | ICD-10-CM | POA: Insufficient documentation

## 2017-06-11 DIAGNOSIS — R2689 Other abnormalities of gait and mobility: Secondary | ICD-10-CM | POA: Diagnosis present

## 2017-06-11 DIAGNOSIS — M6281 Muscle weakness (generalized): Secondary | ICD-10-CM | POA: Diagnosis present

## 2017-06-11 DIAGNOSIS — I69351 Hemiplegia and hemiparesis following cerebral infarction affecting right dominant side: Secondary | ICD-10-CM

## 2017-06-11 NOTE — Therapy (Signed)
[place] MAIN [facility] SERVICES [address] [place], [place], [address] Phone: [phone]   Fax:  [phone]  Physical Therapy Treatment  Patient Details  Name: Curtis Powell MRN: [id number] Date of Birth: [birth year] Referring Provider: Dr. Larinda Buttery   Encounter Date: 06/11/2017  PT End of Session - 06/11/17 1310    Visit Number  24    Number of Visits  48    Date for PT Re-Evaluation  07/21/17    PT Start Time  4132    PT Stop Time  1344    PT Time Calculation (min)  45 min    Equipment Utilized During Treatment  Gait belt    Activity Tolerance  Patient tolerated treatment well    Behavior During Therapy  [facility] for tasks assessed/performed       Past Medical History:  Diagnosis Date  . GERD (gastroesophageal reflux disease)   . Hyperlipidemia   . Hypertension   . Obesity   . Stroke [facility])     Past Surgical History:  Procedure Laterality Date  . BRAIN SURGERY      There were no vitals filed for this visit.  Subjective Assessment - 06/11/17 1307    Subjective  Patient missed last week due to difficulty with rides. Patient reports ambulating in his house every day. Reports HEP compliance, reports no stumbles or falls since last session.     Pertinent History   Patient is a pleasant 52 year old male who presents to physical therapy for weakness and immobility secondary to CVA.  Had a stroke in Feb 2018. He was previously fully independent, but this stroke caused severe residual deficits, mainly on the right side as well as speech, and now he is unable to walk or perform most of his ADLs on his own. Entire right side is very weak. He still has a little difficulty with speech but his swallowing is improved to baseline. His mom had to move in with him and now is his main caregiver.     Limitations  Sitting;Lifting;Standing;Walking;House hold activities    How long can you sit comfortably?  5 minutes    How long can you  stand comfortably?  5 minutes    How long can you walk comfortably?  10 ft    Patient Stated Goals  Walk without walker, walk further with walker, get some strength in back     Currently in Pain?  Yes    Pain Score  3     Pain Location  Shoulder    Pain Orientation  Right    Pain Descriptors / Indicators  Aching    Pain Type  Acute pain    Pain Onset  More than a month ago    Pain Frequency  Constant    Aggravating Factors   weight bearing       Nustep LE only lvl 2 4 minutes    Ambulate 40 ft with walker to and from // bars CGA.    Walking with LUE only in // bars CGA 2x length of bars  , UE fatigue, excessive trunk flexion, when increased neutral position of trunk has posterior LOB, noticing excessive hip flexor tightness   Lunge stretch in // bars to stretch hip flexors 8x 20 seconds each leg. Moving forward though // bars      Side stepping in // bars 2x length of bars with BUE support ; challenge with upright posture    Standing  hip march 10x BLE with BUE support 2 sets each leg . Patient challenged with weight shift onto RLE, fatiguing quickly  Standing extension 10x BLE with BUE support: cues for keeping hands closer to body Standing hip flexion LLE 10x, RLE 10x 2x each set : Min A for weight shift.to right due to Preference to weight shift onto left side  Ambulating backwards in// bars, CGA, 2x 8 ft     #5 ankle weights seated  LAQ2x10 each leg with 3 second holds  Seated marches with upright posture 15x  Seated adduction squeezes 10x 5 second holds    Pt. response to medical necessity:  Patient will continue to benefit from skilled physical therapy to improve strength balance and gait safety.                        PT Education - 06/11/17 1309    Education provided  Yes    Education Details  LE strength and coordination, step length and ambulatory mechanics     Person(s) Educated  Patient    Methods  Explanation;Demonstration;Verbal cues     Comprehension  Verbalized understanding;Returned demonstration       PT Short Term Goals - 05/26/17 1514      PT SHORT TERM GOAL #1   Title  Patient will be independent in home exercise program to improve strength/mobility for better functional independence with ADLs.    Baseline  hep compliant    Time  2    Period  Weeks    Status  Achieved      PT SHORT TERM GOAL #2   Title  Patient will require min cueing for STS transfer with CGA for increased independence with mobility.     Baseline  CGA    Time  2    Period  Weeks    Status  Achieved      PT SHORT TERM GOAL #3   Title  Patient will maintain upright posture for > 15 seconds to demonstrate strengthened postural control muscualture     Baseline  18 seconds     Time  2    Period  Weeks    Status  Achieved        PT Long Term Goals - 05/26/17 1518      PT LONG TERM GOAL #1   Title  Patient will perform bed mobility with Supervision to increase independent mobility    Baseline  Requires Min A for LE for Sit to Supine, independent with bridging to scoot within bed to L and R    Time  8    Period  Weeks    Status  Partially Met      PT LONG TERM GOAL #2   Title  Patient (< 88 years old) will complete five times sit to stand test in < 10 seconds indicating an increased LE strength and improved balance.    Baseline  2/18: 30 seconds from plinth chair height single UE on seat and single on walker ; 32 seconds from plinth with single UE on seat and single on walker.     Time  8    Period  Weeks    Status  Partially Met      PT LONG TERM GOAL #3   Title  Patient will ambulate 40 ft with walker with no breaks to allow for increased mobility within home.     Baseline  12/26: 29 ft with walker and WC  follow with no rest breaks; 2/18: multiple trials previous session of >100 ft     Time  8    Period  Weeks    Status  Achieved      PT LONG TERM GOAL #4   Title  Patient will increase BLE gross strength to 4+/5 as to improve  functional strength for independent gait, increased standing tolerance and increased ADL ability.    Baseline  2/18: 4-/5 RLE,     Time  8    Period  Weeks    Status  Partially Met      PT LONG TERM GOAL #5   Title  Patient will increase lower extremity functional scale to >40/80 to demonstrate improved functional mobility and increased tolerance with ADLs.     Baseline  14/80; 12/26: 21/80; 2/18: 28/80     Time  8    Period  Weeks    Status  Partially Met      Additional Long Term Goals   Additional Long Term Goals  Yes      PT LONG TERM GOAL #6   Title  Patient will walk through a door involving one small step up/down to increase functional independence.     Baseline  require Min A and BUE support in // bars at this time    Time  8    Period  Weeks    Status  Partially Met      PT LONG TERM GOAL #7   Title  Patient will ambulate 40 ft with least assistive device and Supervision with no breaks to allow for increased mobility within home.     Baseline  requires use of walker and CGA    Time  8    Period  Weeks    Status  New            Plan - 06/11/17 1448    Clinical Impression Statement  Patient more confident weight shifting onto left side due to increased strength of LLE. Right leg buckled occasionally throughout session due to fatigue requiring more frequent rest breaks. Seated interventions performed when patient became too fatigued for proper form during standing interventions.  Patient will continue to benefit from skilled physical therapy to improve strength balance and gait safety.    Rehab Potential  Fair    Clinical Impairments Affecting Rehab Potential  hx of HTN, HLD, CVA, learning disability, Diabetes, brain tumor,     PT Frequency  2x / week    PT Duration  8 weeks    PT Treatment/Interventions  ADLs/Self Care Home Management;Aquatic Therapy;Ultrasound;Moist Heat;Traction;DME Instruction;Gait training;Stair training;Functional mobility training;Therapeutic  activities;Therapeutic exercise;Orthotic Fit/Training;Neuromuscular re-education;Balance training;Patient/family education;Manual techniques;Wheelchair mobility training;Passive range of motion;Energy conservation;Taping;Visual/perceptual remediation/compensation    PT Next Visit Plan  bed mobility, standing strengthening interventions    PT Home Exercise Plan  see sheet    Consulted and Agree with Plan of Care  Patient;Family member/caregiver    Family Member Consulted  mother       Patient will benefit from skilled therapeutic intervention in order to improve the following deficits and impairments:  Abnormal gait, Decreased activity tolerance, Decreased balance, Decreased knowledge of precautions, Decreased endurance, Decreased coordination, Decreased knowledge of use of DME, Decreased mobility, Decreased range of motion, Difficulty walking, Decreased safety awareness, Decreased strength, Impaired flexibility, Impaired perceived functional ability, Impaired tone, Postural dysfunction, Improper body mechanics, Pain  Visit Diagnosis: Muscle weakness (generalized)  Other lack of coordination  Hemiplegia and hemiparesis following cerebral infarction  affecting right dominant side ([place])  Other abnormalities of gait and mobility     Problem List Patient Active Problem List   Diagnosis Date Noted  . Acute CVA (cerebrovascular accident) ([place]) 05/02/2016  . Left-sided weakness 05/01/2016   Janna Arch, PT, DPT   Janna Arch 06/11/2017, 2:48 PM  [place] MAIN [facility] SERVICES [address] [place], [place], [address] Phone: [phone]   Fax:  [phone]  Name: Curtis Powell MRN: [id number] Date of Birth: [birth year]

## 2017-06-15 NOTE — Therapy (Signed)
[place] MAIN [facility] SERVICES [address] [place], [place], [address] Phone: [phone]   Fax:  [phone]  Occupational Therapy Treatment  Patient Details  Name: LARAY CORBIT MRN: [id number] Date of Birth: 05/15/1965 Referring Provider: Dr. Larinda Buttery   Encounter Date: 06/11/2017  OT End of Session - 06/15/17 2026    Visit Number  21    Number of Visits  36    Date for OT Re-Evaluation  07/30/17    OT Start Time  1400    OT Stop Time  1445    OT Time Calculation (min)  45 min    Activity Tolerance  Patient tolerated treatment well    Behavior During Therapy  [facility] for tasks assessed/performed       Past Medical History:  Diagnosis Date  . GERD (gastroesophageal reflux disease)   . Hyperlipidemia   . Hypertension   . Obesity   . Stroke [facility])     Past Surgical History:  Procedure Laterality Date  . BRAIN SURGERY      There were no vitals filed for this visit.  Subjective Assessment - 06/15/17 2025    Subjective   Patient reports he would like to work on arm strength today.      Pertinent History  Pt. is a 52 y.o. male who suffered a CVA on 05/01/2016. Pt. was admitted to the hospital. Once discharged, he received Home Health PT and OT services for about a month. Pt. has had multiple CVAs over the past 8 years, and has had multiple falls in the past 6 months. Pt. resides in an apartment. Pt. has caregivers for 80 hours. Pt.'s mother stays with pt. at night, and assists with IADL tasks.      Patient Stated Goals  To be able to throw a ball and dribble a ball.    Currently in Pain?  No/denies    Pain Score  0-No pain                   OT Treatments/Exercises (OP) - 06/15/17 2031      Neurological Re-education Exercises   Other Exercises 1  Grip strengthening with 11# for 25 repetitions for 2 sets with right and left hands. Unknotting exercise with medium nylon rope with cues for prehension pattern.   Resistive putty green with 2 containers mixed together with bilateral hand pulls, twists, grip and pinch for multiple repetitions and sets.              OT Education - 06/15/17 2025    Education provided  Yes    Education Details  UE strength, coordination    Person(s) Educated  Patient    Methods  Explanation;Demonstration;Verbal cues    Comprehension  Verbal cues required;Returned demonstration;Verbalized understanding       OT Short Term Goals - 03/03/17 1459      OT SHORT TERM GOAL #1   Title  `        OT Long Term Goals - 05/28/17 1404      OT LONG TERM GOAL #1   Title  Pt. will UE functioning to be able to perfrom toilet hygiene with minimal assistance.    Baseline  Pt. is unable. 05/07/2017: Pt. continues to occassionally have difficulty.    Time  12    Period  Weeks    Status  On-going    Target Date  07/30/17      OT LONG TERM GOAL #  2   Title  Pt. will complete self grooming with minA    Baseline  Pt. requires extensive assist. Pt. is improving with oral care with set-up, however requires extensive assist for self-grooming tasls.    Time  12    Period  Weeks    Status  On-going    Target Date  07/30/17      OT LONG TERM GOAL #3   Title  Pt. will perfrom self-feeding skills with modified indepndence    Baseline  Pt. has difficutly, and performs with increased spillage 05/07/2017: Pt. has difficulty with occassionally spilling.    Time  12    Period  Weeks    Status  On-going    Target Date  07/30/17      OT LONG TERM GOAL #4   Title  Pt. will perform self dressing with minA and A/E as needed.    Baseline  Pt. requires extensive assist: Pt. conitnues to have difficulty perfroming UE,a nd LE dressing.    Time  12    Period  Weeks    Status  On-going    Target Date  07/30/17      OT LONG TERM GOAL #5   Title  Pt. will perfrom light home making tasks with minA    Baseline  Pt. requires extensive assistance 05/07/2017: Pt. continues to have difficulty  folding clothes. Pt. is improving with making a sandwich.    Time  12    Period  Weeks    Status  On-going    Target Date  06/01/17      OT LONG TERM GOAL #6   Title  Pt. will write his name efficiently with 100% legibility    Baseline  50% legibility with incresed time required 04/27/2017: Pt. continues to present with difficulty writing legibly.     Time  12    Period  Weeks    Status  On-going    Target Date  07/30/17            Plan - 06/15/17 2027    Clinical Impression Statement  Patient working towards addition of resistive putty exercises for pulling and twisting to work on strength of finger, wrist and hands.  Patient able to demonstrate with cues. Patient continues to benefit from skilled OT to maximize safety and independence in daily tasks.     Occupational performance deficits (Please refer to evaluation for details):  ADL's;IADL's    Rehab Potential  Fair    OT Frequency  2x / week    OT Duration  12 weeks    OT Treatment/Interventions  Self-care/ADL training;Patient/family education;DME and/or AE instruction;Therapeutic exercise;Moist Heat    Consulted and Agree with Plan of Care  Patient       Patient will benefit from skilled therapeutic intervention in order to improve the following deficits and impairments:  Pain, Decreased cognition, Decreased activity tolerance, Impaired UE functional use, Decreased range of motion, Decreased coordination, Decreased endurance  Visit Diagnosis: Muscle weakness (generalized)  Other lack of coordination  Hemiplegia and hemiparesis following cerebral infarction affecting right dominant side [facility])    Problem List Patient Active Problem List   Diagnosis Date Noted  . Acute CVA (cerebrovascular accident) ([place]) 05/02/2016  . Left-sided weakness 05/01/2016  Achilles Dunk, OTR/L, CLT   Blair Lundeen 06/15/2017, 8:36 PM  [place] MAIN [facility] SERVICES [address] [place], [place],  [address] Phone: [phone]   Fax:  [phone]  Name: Sheridan  KLAY SOBOTKA MRN: [id number] Date of Birth: 1965-11-21

## 2017-06-16 ENCOUNTER — Ambulatory Visit: Payer: Medicare HMO

## 2017-06-16 DIAGNOSIS — M6281 Muscle weakness (generalized): Secondary | ICD-10-CM

## 2017-06-16 DIAGNOSIS — R2689 Other abnormalities of gait and mobility: Secondary | ICD-10-CM

## 2017-06-16 DIAGNOSIS — R278 Other lack of coordination: Secondary | ICD-10-CM

## 2017-06-16 DIAGNOSIS — I69351 Hemiplegia and hemiparesis following cerebral infarction affecting right dominant side: Secondary | ICD-10-CM

## 2017-06-16 NOTE — Therapy (Signed)
[place] MAIN [facility] SERVICES [address] [place], [place], [address] Phone: [phone]   Fax:  [phone]  Physical Therapy Treatment  Patient Details  Name: Curtis Powell MRN: [id number] Date of Birth: [birth year] Referring Provider: Dr. Larinda Buttery   Encounter Date: 06/16/2017  PT End of Session - 06/16/17 1311    Visit Number  25    Number of Visits  48    Date for PT Re-Evaluation  07/21/17    PT Start Time  1287    PT Stop Time  1345    PT Time Calculation (min)  46 min    Equipment Utilized During Treatment  Gait belt    Activity Tolerance  Patient tolerated treatment well    Behavior During Therapy  [facility] for tasks assessed/performed       Past Medical History:  Diagnosis Date  . GERD (gastroesophageal reflux disease)   . Hyperlipidemia   . Hypertension   . Obesity   . Stroke [facility])     Past Surgical History:  Procedure Laterality Date  . BRAIN SURGERY      There were no vitals filed for this visit.  Subjective Assessment - 06/16/17 1308    Subjective  Patient feeling good today, still having some shoulder pain. Patient reports compliance with HEP and no stumbles or falls.     Pertinent History   Patient is a pleasant 52 year old male who presents to physical therapy for weakness and immobility secondary to CVA.  Had a stroke in Feb 2018. He was previously fully independent, but this stroke caused severe residual deficits, mainly on the right side as well as speech, and now he is unable to walk or perform most of his ADLs on his own. Entire right side is very weak. He still has a little difficulty with speech but his swallowing is improved to baseline. His mom had to move in with him and now is his main caregiver.     Limitations  Sitting;Lifting;Standing;Walking;House hold activities    How long can you sit comfortably?  5 minutes    How long can you stand comfortably?  5 minutes    How long can you walk  comfortably?  10 ft    Patient Stated Goals  Walk without walker, walk further with walker, get some strength in back     Currently in Pain?  No/denies       Nustep LE only lvl 3 4 minutes    Ambulate 96 ft with walker to and from // bars CGA. Cues for upright posture and lifting knee for clearance of feet    Walking with LUE only in // bars CGA 2x length of bars  , UE fatigue, excessive trunk flexion, when increased neutral position of trunk has posterior LOB, noticing excessive hip flexor tightness   Lunge stretch in // bars to stretch hip flexors 8x 20 seconds each leg. Moving forward though // bars    Side stepping in // bars 2x length of bars with BUE support ; challenge with upright posture    Bathroom mobility: sit to stand, stand without UE support. transfer   Pt. response to medical necessity:  Patient will continue to benefit from skilled physical therapy to improve strength balance and gait safety.                          PT Education - 06/16/17 1310  Education provided  Yes    Education Details  LE strength, coordination, mobility     Person(s) Educated  Patient    Methods  Explanation;Demonstration;Verbal cues    Comprehension  Verbalized understanding;Returned demonstration       PT Short Term Goals - 05/26/17 1514      PT SHORT TERM GOAL #1   Title  Patient will be independent in home exercise program to improve strength/mobility for better functional independence with ADLs.    Baseline  hep compliant    Time  2    Period  Weeks    Status  Achieved      PT SHORT TERM GOAL #2   Title  Patient will require min cueing for STS transfer with CGA for increased independence with mobility.     Baseline  CGA    Time  2    Period  Weeks    Status  Achieved      PT SHORT TERM GOAL #3   Title  Patient will maintain upright posture for > 15 seconds to demonstrate strengthened postural control muscualture     Baseline  18 seconds     Time  2     Period  Weeks    Status  Achieved        PT Long Term Goals - 05/26/17 1518      PT LONG TERM GOAL #1   Title  Patient will perform bed mobility with Supervision to increase independent mobility    Baseline  Requires Min A for LE for Sit to Supine, independent with bridging to scoot within bed to L and R    Time  8    Period  Weeks    Status  Partially Met      PT LONG TERM GOAL #2   Title  Patient (< 35 years old) will complete five times sit to stand test in < 10 seconds indicating an increased LE strength and improved balance.    Baseline  2/18: 30 seconds from plinth chair height single UE on seat and single on walker ; 32 seconds from plinth with single UE on seat and single on walker.     Time  8    Period  Weeks    Status  Partially Met      PT LONG TERM GOAL #3   Title  Patient will ambulate 40 ft with walker with no breaks to allow for increased mobility within home.     Baseline  12/26: 29 ft with walker and WC follow with no rest breaks; 2/18: multiple trials previous session of >100 ft     Time  8    Period  Weeks    Status  Achieved      PT LONG TERM GOAL #4   Title  Patient will increase BLE gross strength to 4+/5 as to improve functional strength for independent gait, increased standing tolerance and increased ADL ability.    Baseline  2/18: 4-/5 RLE,     Time  8    Period  Weeks    Status  Partially Met      PT LONG TERM GOAL #5   Title  Patient will increase lower extremity functional scale to >40/80 to demonstrate improved functional mobility and increased tolerance with ADLs.     Baseline  14/80; 12/26: 21/80; 2/18: 28/80     Time  8    Period  Weeks    Status  Partially Met  Additional Long Term Goals   Additional Long Term Goals  Yes      PT LONG TERM GOAL #6   Title  Patient will walk through a door involving one small step up/down to increase functional independence.     Baseline  require Min A and BUE support in // bars at this time     Time  8    Period  Weeks    Status  Partially Met      PT LONG TERM GOAL #7   Title  Patient will ambulate 40 ft with least assistive device and Supervision with no breaks to allow for increased mobility within home.     Baseline  requires use of walker and CGA    Time  8    Period  Weeks    Status  New            Plan - 06/16/17 1346    Clinical Impression Statement  Patient progressing with functional strength and mobility. Right LE demonstrates decreased strength with occasional fatigue and collapse limiting standing duration. Patient will continue to benefit from skilled physical therapy to improve strength balance and gait safety.    Rehab Potential  Fair    Clinical Impairments Affecting Rehab Potential  hx of HTN, HLD, CVA, learning disability, Diabetes, brain tumor,     PT Frequency  2x / week    PT Duration  8 weeks    PT Treatment/Interventions  ADLs/Self Care Home Management;Aquatic Therapy;Ultrasound;Moist Heat;Traction;DME Instruction;Gait training;Stair training;Functional mobility training;Therapeutic activities;Therapeutic exercise;Orthotic Fit/Training;Neuromuscular re-education;Balance training;Patient/family education;Manual techniques;Wheelchair mobility training;Passive range of motion;Energy conservation;Taping;Visual/perceptual remediation/compensation    PT Next Visit Plan  bed mobility, standing strengthening interventions    PT Home Exercise Plan  see sheet    Consulted and Agree with Plan of Care  Patient;Family member/caregiver    Family Member Consulted  mother       Patient will benefit from skilled therapeutic intervention in order to improve the following deficits and impairments:  Abnormal gait, Decreased activity tolerance, Decreased balance, Decreased knowledge of precautions, Decreased endurance, Decreased coordination, Decreased knowledge of use of DME, Decreased mobility, Decreased range of motion, Difficulty walking, Decreased safety awareness,  Decreased strength, Impaired flexibility, Impaired perceived functional ability, Impaired tone, Postural dysfunction, Improper body mechanics, Pain  Visit Diagnosis: Muscle weakness (generalized)  Other lack of coordination  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 Acute CVA (cerebrovascular accident) ([place]) 05/02/2016  . Left-sided weakness 05/01/2016   Janna Arch, PT, DPT   Janna Arch 06/16/2017, 1:46 PM  [place] [facility] MAIN [facility] SERVICES [address] [place], [place], [address] Phone: [phone]   Fax:  [phone]  Name: Curtis Powell MRN: [id number] Date of Birth: 1966/01/16

## 2017-06-18 ENCOUNTER — Ambulatory Visit: Payer: Medicare HMO

## 2017-06-18 DIAGNOSIS — R2689 Other abnormalities of gait and mobility: Secondary | ICD-10-CM

## 2017-06-18 DIAGNOSIS — M6281 Muscle weakness (generalized): Secondary | ICD-10-CM

## 2017-06-18 DIAGNOSIS — I69351 Hemiplegia and hemiparesis following cerebral infarction affecting right dominant side: Secondary | ICD-10-CM

## 2017-06-18 DIAGNOSIS — R278 Other lack of coordination: Secondary | ICD-10-CM

## 2017-06-18 NOTE — Therapy (Signed)
[place] MAIN [facility] SERVICES [address] [place], [place], [address] Phone: [phone]   Fax:  [phone]  Physical Therapy Treatment  Patient Details  Name: Curtis Powell MRN: [id number] Date of Birth: 1965/10/09 Referring Provider: Dr. Larinda Buttery   Encounter Date: 06/18/2017  PT End of Session - 06/18/17 1320    Visit Number  26    Number of Visits  48    Date for PT Re-Evaluation  07/21/17    PT Start Time  1310    PT Stop Time  1345    PT Time Calculation (min)  35 min    Equipment Utilized During Treatment  Gait belt    Activity Tolerance  Patient tolerated treatment well    Behavior During Therapy  [facility] for tasks assessed/performed       Past Medical History:  Diagnosis Date  . GERD (gastroesophageal reflux disease)   . Hyperlipidemia   . Hypertension   . Obesity   . Stroke [facility])     Past Surgical History:  Procedure Laterality Date  . BRAIN SURGERY      There were no vitals filed for this visit.  Subjective Assessment - 06/18/17 1318    Subjective  Patient reports compliance with HEP, no stumbles or falls since last session, no knee pain. Was late due to ride being delayed    Pertinent History   Patient is a pleasant 52 year old male who presents to physical therapy for weakness and immobility secondary to CVA.  Had a stroke in Feb 2018. He was previously fully independent, but this stroke caused severe residual deficits, mainly on the right side as well as speech, and now he is unable to walk or perform most of his ADLs on his own. Entire right side is very weak. He still has a little difficulty with speech but his swallowing is improved to baseline. His mom had to move in with him and now is his main caregiver.     Limitations  Sitting;Lifting;Standing;Walking;House hold activities    How long can you sit comfortably?  5 minutes    How long can you stand comfortably?  5 minutes    How long can you walk  comfortably?  10 ft    Patient Stated Goals  Walk without walker, walk further with walker, get some strength in back     Currently in Pain?  Yes    Pain Score  1     Pain Location  Shoulder    Pain Orientation  Right    Pain Descriptors / Indicators  Aching    Pain Type  Acute pain    Pain Frequency  Constant       Nustep LE only lvl 2 4 minutes       Standing flexion 10x BLE with BUE support Standing extension 10x BLE with BUE support Standing abduction 10x BLE with BUE support    Standing single limb marching 10x L 10x R BUE support ; RLE collapse due to fatigue  Heel raise weight shift L and RLE to promote weight shift to RLE without knee buckling 10x.   Ambulating backwards in// bars, CGA, 2x 8 ft     seated gluteal squeezes 10x   Seated knee marches 15x  Seated adduction 10x 3 second squeezes   Patient requires frequent seated rest breaks.    Pt. response to medical necessity:  Patient will continue to benefit from skilled physical therapy to improve strength  balance and gait safety.                          PT Education - 06/18/17 1319    Education provided  Yes    Education Details  LE strength, standing interventions, coordination, mobility     Person(s) Educated  Patient    Methods  Explanation;Demonstration;Verbal cues    Comprehension  Verbalized understanding;Returned demonstration       PT Short Term Goals - 05/26/17 1514      PT SHORT TERM GOAL #1   Title  Patient will be independent in home exercise program to improve strength/mobility for better functional independence with ADLs.    Baseline  hep compliant    Time  2    Period  Weeks    Status  Achieved      PT SHORT TERM GOAL #2   Title  Patient will require min cueing for STS transfer with CGA for increased independence with mobility.     Baseline  CGA    Time  2    Period  Weeks    Status  Achieved      PT SHORT TERM GOAL #3   Title  Patient will maintain  upright posture for > 15 seconds to demonstrate strengthened postural control muscualture     Baseline  18 seconds     Time  2    Period  Weeks    Status  Achieved        PT Long Term Goals - 05/26/17 1518      PT LONG TERM GOAL #1   Title  Patient will perform bed mobility with Supervision to increase independent mobility    Baseline  Requires Min A for LE for Sit to Supine, independent with bridging to scoot within bed to L and R    Time  8    Period  Weeks    Status  Partially Met      PT LONG TERM GOAL #2   Title  Patient (< 63 years old) will complete five times sit to stand test in < 10 seconds indicating an increased LE strength and improved balance.    Baseline  2/18: 30 seconds from plinth chair height single UE on seat and single on walker ; 32 seconds from plinth with single UE on seat and single on walker.     Time  8    Period  Weeks    Status  Partially Met      PT LONG TERM GOAL #3   Title  Patient will ambulate 40 ft with walker with no breaks to allow for increased mobility within home.     Baseline  12/26: 29 ft with walker and WC follow with no rest breaks; 2/18: multiple trials previous session of >100 ft     Time  8    Period  Weeks    Status  Achieved      PT LONG TERM GOAL #4   Title  Patient will increase BLE gross strength to 4+/5 as to improve functional strength for independent gait, increased standing tolerance and increased ADL ability.    Baseline  2/18: 4-/5 RLE,     Time  8    Period  Weeks    Status  Partially Met      PT LONG TERM GOAL #5   Title  Patient will increase lower extremity functional scale to >40/80 to demonstrate improved functional  mobility and increased tolerance with ADLs.     Baseline  14/80; 12/26: 21/80; 2/18: 28/80     Time  8    Period  Weeks    Status  Partially Met      Additional Long Term Goals   Additional Long Term Goals  Yes      PT LONG TERM GOAL #6   Title  Patient will walk through a door involving one  small step up/down to increase functional independence.     Baseline  require Min A and BUE support in // bars at this time    Time  8    Period  Weeks    Status  Partially Met      PT LONG TERM GOAL #7   Title  Patient will ambulate 40 ft with least assistive device and Supervision with no breaks to allow for increased mobility within home.     Baseline  requires use of walker and CGA    Time  8    Period  Weeks    Status  New            Plan - 06/18/17 1332    Clinical Impression Statement  Right lower extremity fatigues quicker than left lower extremity requiring frequent seated rest breaks due to buckling of knee under weight. Patient arrived late limiting session duration. Patient will continue to benefit from skilled physical therapy to improve strength balance and gait safety.    Rehab Potential  Fair    Clinical Impairments Affecting Rehab Potential  hx of HTN, HLD, CVA, learning disability, Diabetes, brain tumor,     PT Frequency  2x / week    PT Duration  8 weeks    PT Treatment/Interventions  ADLs/Self Care Home Management;Aquatic Therapy;Ultrasound;Moist Heat;Traction;DME Instruction;Gait training;Stair training;Functional mobility training;Therapeutic activities;Therapeutic exercise;Orthotic Fit/Training;Neuromuscular re-education;Balance training;Patient/family education;Manual techniques;Wheelchair mobility training;Passive range of motion;Energy conservation;Taping;Visual/perceptual remediation/compensation    PT Next Visit Plan  bed mobility, standing strengthening interventions    PT Home Exercise Plan  see sheet    Consulted and Agree with Plan of Care  Patient;Family member/caregiver    Family Member Consulted  mother       Patient will benefit from skilled therapeutic intervention in order to improve the following deficits and impairments:  Abnormal gait, Decreased activity tolerance, Decreased balance, Decreased knowledge of precautions, Decreased endurance,  Decreased coordination, Decreased knowledge of use of DME, Decreased mobility, Decreased range of motion, Difficulty walking, Decreased safety awareness, Decreased strength, Impaired flexibility, Impaired perceived functional ability, Impaired tone, Postural dysfunction, Improper body mechanics, Pain  Visit Diagnosis: Muscle weakness (generalized)  Other lack of coordination  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 Acute CVA (cerebrovascular accident) ([place]) 05/02/2016  . Left-sided weakness 05/01/2016   Janna Arch, PT, DPT   Janna Arch 06/18/2017, 1:46 PM  [facility] MAIN [facility] SERVICES [address] [place], [place], [address] Phone: [phone]   Fax:  [phone]  Name: Curtis Powell MRN: [id number] Date of Birth: [birth year]

## 2017-06-24 ENCOUNTER — Ambulatory Visit: Payer: Medicare HMO

## 2017-06-24 ENCOUNTER — Ambulatory Visit: Payer: Medicare HMO | Admitting: Occupational Therapy

## 2017-06-24 DIAGNOSIS — M6281 Muscle weakness (generalized): Secondary | ICD-10-CM

## 2017-06-24 DIAGNOSIS — R278 Other lack of coordination: Secondary | ICD-10-CM

## 2017-06-24 DIAGNOSIS — I69351 Hemiplegia and hemiparesis following cerebral infarction affecting right dominant side: Secondary | ICD-10-CM

## 2017-06-24 DIAGNOSIS — R2689 Other abnormalities of gait and mobility: Secondary | ICD-10-CM

## 2017-06-24 NOTE — Therapy (Signed)
[place] MAIN [facility] SERVICES [address] [place], [place], [address] Phone: [phone]   Fax:  [phone]  Physical Therapy Treatment  Patient Details  Name: Curtis Powell MRN: [id number] Date of Birth: 06/29/1965 Referring Provider: Dr. Larinda Buttery   Encounter Date: 06/24/2017  PT End of Session - 06/24/17 1355    Visit Number  27    Number of Visits  48    Date for PT Re-Evaluation  07/21/17    PT Start Time  3532    PT Stop Time  1430    PT Time Calculation (min)  45 min    Equipment Utilized During Treatment  Gait belt    Activity Tolerance  Patient tolerated treatment well    Behavior During Therapy  [facility] for tasks assessed/performed       Past Medical History:  Diagnosis Date  . GERD (gastroesophageal reflux disease)   . Hyperlipidemia   . Hypertension   . Obesity   . Stroke [facility])     Past Surgical History:  Procedure Laterality Date  . BRAIN SURGERY      There were no vitals filed for this visit.  Subjective Assessment - 06/24/17 1353    Subjective  Patient reports compliance with HEP. Patient reports a fall yesterday when walking to the kitchen, fell backwards but reports no pain.  Was tired before fall.     Pertinent History   Patient is a pleasant 52 year old male who presents to physical therapy for weakness and immobility secondary to CVA.  Had a stroke in Feb 2018. He was previously fully independent, but this stroke caused severe residual deficits, mainly on the right side as well as speech, and now he is unable to walk or perform most of his ADLs on his own. Entire right side is very weak. He still has a little difficulty with speech but his swallowing is improved to baseline. His mom had to move in with him and now is his main caregiver.     Limitations  Sitting;Lifting;Standing;Walking;House hold activities    How long can you sit comfortably?  5 minutes    How long can you stand comfortably?  5  minutes    How long can you walk comfortably?  10 ft    Patient Stated Goals  Walk without walker, walk further with walker, get some strength in back     Currently in Pain?  No/denies          Nustep LE only lvl 2 4 minutes    Standing flexion 10x BLE with BUE support Standing extension 10x BLE with BUE support Standing abduction 10x BLE with BUE support    Forward lunges 2x20 seconds each leg   Standing single limb marching 10x L 10x R BUE support ; RLE collapse due to fatigue   Heel raise weight shift L and RLE to promote weight shift to RLE without knee buckling 10x.   Standing without UE support on airex pad 4 minutes with intermittent UE support and frequent posterior LOB and trunk flexion with fatigue.    Ambulating backwards in// bars, CGA, 2x 8 ft     seated gluteal squeezes 10x    Seated knee marches 15x   Standing hamstring curls 10x each leg.    Patient requires frequent seated rest breaks.    Pt. response to medical necessity:  Patient will continue to benefit from skilled physical therapy to improve strength balance and gait safety.  PT Education - 06/24/17 1355    Education provided  Yes    Education Details  LE strength, standing interventions, coordination, mobility     Person(s) Educated  Patient    Methods  Explanation;Demonstration;Verbal cues    Comprehension  Verbalized understanding;Returned demonstration;Verbal cues required       PT Short Term Goals - 05/26/17 1514      PT SHORT TERM GOAL #1   Title  Patient will be independent in home exercise program to improve strength/mobility for better functional independence with ADLs.    Baseline  hep compliant    Time  2    Period  Weeks    Status  Achieved      PT SHORT TERM GOAL #2   Title  Patient will require min cueing for STS transfer with CGA for increased independence with mobility.     Baseline  CGA    Time  2    Period  Weeks    Status   Achieved      PT SHORT TERM GOAL #3   Title  Patient will maintain upright posture for > 15 seconds to demonstrate strengthened postural control muscualture     Baseline  18 seconds     Time  2    Period  Weeks    Status  Achieved        PT Long Term Goals - 05/26/17 1518      PT LONG TERM GOAL #1   Title  Patient will perform bed mobility with Supervision to increase independent mobility    Baseline  Requires Min A for LE for Sit to Supine, independent with bridging to scoot within bed to L and R    Time  8    Period  Weeks    Status  Partially Met      PT LONG TERM GOAL #2   Title  Patient (< 35 years old) will complete five times sit to stand test in < 10 seconds indicating an increased LE strength and improved balance.    Baseline  2/18: 30 seconds from plinth chair height single UE on seat and single on walker ; 32 seconds from plinth with single UE on seat and single on walker.     Time  8    Period  Weeks    Status  Partially Met      PT LONG TERM GOAL #3   Title  Patient will ambulate 40 ft with walker with no breaks to allow for increased mobility within home.     Baseline  12/26: 29 ft with walker and WC follow with no rest breaks; 2/18: multiple trials previous session of >100 ft     Time  8    Period  Weeks    Status  Achieved      PT LONG TERM GOAL #4   Title  Patient will increase BLE gross strength to 4+/5 as to improve functional strength for independent gait, increased standing tolerance and increased ADL ability.    Baseline  2/18: 4-/5 RLE,     Time  8    Period  Weeks    Status  Partially Met      PT LONG TERM GOAL #5   Title  Patient will increase lower extremity functional scale to >40/80 to demonstrate improved functional mobility and increased tolerance with ADLs.     Baseline  14/80; 12/26: 21/80; 2/18: 28/80     Time  8  Period  Weeks    Status  Partially Met      Additional Long Term Goals   Additional Long Term Goals  Yes      PT LONG  TERM GOAL #6   Title  Patient will walk through a door involving one small step up/down to increase functional independence.     Baseline  require Min A and BUE support in // bars at this time    Time  8    Period  Weeks    Status  Partially Met      PT LONG TERM GOAL #7   Title  Patient will ambulate 40 ft with least assistive device and Supervision with no breaks to allow for increased mobility within home.     Baseline  requires use of walker and CGA    Time  8    Period  Weeks    Status  New            Plan - 06/24/17 1412    Clinical Impression Statement  Patient presents to physical therapy with trouble with transportation. Has noted weakness of RLE>LLE with RLE fatiguing quicker than left requiring seated rest breaks. Patient educated on when to rest when walking at home to avoid falls. Frequent cueing for upright posture with patient defaulting to excessive trunk flexion with fatigue.  Patient will continue to benefit from skilled physical therapy to improve strength balance and gait safety.    Rehab Potential  Fair    Clinical Impairments Affecting Rehab Potential  hx of HTN, HLD, CVA, learning disability, Diabetes, brain tumor,     PT Frequency  2x / week    PT Duration  8 weeks    PT Treatment/Interventions  ADLs/Self Care Home Management;Aquatic Therapy;Ultrasound;Moist Heat;Traction;DME Instruction;Gait training;Stair training;Functional mobility training;Therapeutic activities;Therapeutic exercise;Orthotic Fit/Training;Neuromuscular re-education;Balance training;Patient/family education;Manual techniques;Wheelchair mobility training;Passive range of motion;Energy conservation;Taping;Visual/perceptual remediation/compensation    PT Next Visit Plan  bed mobility, standing strengthening interventions    PT Home Exercise Plan  see sheet    Consulted and Agree with Plan of Care  Patient;Family member/caregiver    Family Member Consulted  mother       Patient will benefit  from skilled therapeutic intervention in order to improve the following deficits and impairments:  Abnormal gait, Decreased activity tolerance, Decreased balance, Decreased knowledge of precautions, Decreased endurance, Decreased coordination, Decreased knowledge of use of DME, Decreased mobility, Decreased range of motion, Difficulty walking, Decreased safety awareness, Decreased strength, Impaired flexibility, Impaired perceived functional ability, Impaired tone, Postural dysfunction, Improper body mechanics, Pain  Visit Diagnosis: Muscle weakness (generalized)  Other lack of coordination  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 Acute CVA (cerebrovascular accident) ([place]) 05/02/2016  . Left-sided weakness 05/01/2016   Janna Arch, PT, DPT   Janna Arch 06/24/2017, 2:33 PM  [place] MAIN [facility] SERVICES [address] [place], [place], [address] Phone: [phone]   Fax:  [phone]  Name: Curtis Powell MRN: [id number] Date of Birth: [birth year]

## 2017-06-26 ENCOUNTER — Ambulatory Visit: Payer: Medicare HMO

## 2017-06-26 DIAGNOSIS — M6281 Muscle weakness (generalized): Secondary | ICD-10-CM | POA: Diagnosis not present

## 2017-06-26 DIAGNOSIS — R278 Other lack of coordination: Secondary | ICD-10-CM

## 2017-06-26 DIAGNOSIS — R2689 Other abnormalities of gait and mobility: Secondary | ICD-10-CM

## 2017-06-26 DIAGNOSIS — I69351 Hemiplegia and hemiparesis following cerebral infarction affecting right dominant side: Secondary | ICD-10-CM

## 2017-06-26 NOTE — Therapy (Signed)
[place] MAIN [facility] SERVICES [address] [place], [place], [address] Phone: [phone]   Fax:  [phone]  Physical Therapy Treatment  Patient Details  Name: Curtis Powell MRN: [id number] Date of Birth: May 12, 1965 Referring Provider: Dr. Larinda Buttery   Encounter Date: 06/26/2017  PT End of Session - 06/26/17 1435    Visit Number  28    Number of Visits  73    Date for PT Re-Evaluation  07/21/17    PT Start Time  8003    PT Stop Time  1515    PT Time Calculation (min)  46 min    Equipment Utilized During Treatment  Gait belt    Activity Tolerance  Patient tolerated treatment well    Behavior During Therapy  [facility] for tasks assessed/performed       Past Medical History:  Diagnosis Date  . GERD (gastroesophageal reflux disease)   . Hyperlipidemia   . Hypertension   . Obesity   . Stroke [facility])     Past Surgical History:  Procedure Laterality Date  . BRAIN SURGERY      There were no vitals filed for this visit.  Subjective Assessment - 06/26/17 1431    Subjective  Patient reports compliance with HEP. No falls since last session. Slight shoulder pain that is improving.     Pertinent History   Patient is a pleasant 52 year old male who presents to physical therapy for weakness and immobility secondary to CVA.  Had a stroke in Feb 2018. He was previously fully independent, but this stroke caused severe residual deficits, mainly on the right side as well as speech, and now he is unable to walk or perform most of his ADLs on his own. Entire right side is very weak. He still has a little difficulty with speech but his swallowing is improved to baseline. His mom had to move in with him and now is his main caregiver.     Limitations  Sitting;Lifting;Standing;Walking;House hold activities    How long can you sit comfortably?  5 minutes    How long can you stand comfortably?  5 minutes    How long can you walk comfortably?  10 ft     Patient Stated Goals  Walk without walker, walk further with walker, get some strength in back     Currently in Pain?  Yes    Pain Score  1     Pain Location  Shoulder    Pain Orientation  Right    Pain Descriptors / Indicators  Aching    Pain Type  Acute pain       Nustep LE only lvl 2 4 minutes   Modified single limb stance: green pad under one foot to promote weight shift to other side 3x30 seconds each side .  Bathroom mobility: sit to stand, static stand balance, pulling up pants, washing hands, toileting.    Standing flexion 10x BLE with BUE support Standing extension 10x BLE with BUE support Standing abduction 10x BLE with BUE support    Forward lunges 2x20 seconds each leg     Ambulating backwards in// bars, CGA, 2x 5 ft       Pt. response to medical necessity:  Patient will continue to benefit from skilled physical therapy to improve strength balance and gait safety                        PT Education -  06/26/17 1433    Education provided  Yes    Education Details  LE  strength posture, standing balance, coordination, mobility     Person(s) Educated  Patient    Methods  Explanation;Demonstration;Verbal cues    Comprehension  Verbalized understanding;Returned demonstration       PT Short Term Goals - 05/26/17 1514      PT SHORT TERM GOAL #1   Title  Patient will be independent in home exercise program to improve strength/mobility for better functional independence with ADLs.    Baseline  hep compliant    Time  2    Period  Weeks    Status  Achieved      PT SHORT TERM GOAL #2   Title  Patient will require min cueing for STS transfer with CGA for increased independence with mobility.     Baseline  CGA    Time  2    Period  Weeks    Status  Achieved      PT SHORT TERM GOAL #3   Title  Patient will maintain upright posture for > 15 seconds to demonstrate strengthened postural control muscualture     Baseline  18 seconds     Time  2     Period  Weeks    Status  Achieved        PT Long Term Goals - 05/26/17 1518      PT LONG TERM GOAL #1   Title  Patient will perform bed mobility with Supervision to increase independent mobility    Baseline  Requires Min A for LE for Sit to Supine, independent with bridging to scoot within bed to L and R    Time  8    Period  Weeks    Status  Partially Met      PT LONG TERM GOAL #2   Title  Patient (< 51 years old) will complete five times sit to stand test in < 10 seconds indicating an increased LE strength and improved balance.    Baseline  2/18: 30 seconds from plinth chair height single UE on seat and single on walker ; 32 seconds from plinth with single UE on seat and single on walker.     Time  8    Period  Weeks    Status  Partially Met      PT LONG TERM GOAL #3   Title  Patient will ambulate 40 ft with walker with no breaks to allow for increased mobility within home.     Baseline  12/26: 29 ft with walker and WC follow with no rest breaks; 2/18: multiple trials previous session of >100 ft     Time  8    Period  Weeks    Status  Achieved      PT LONG TERM GOAL #4   Title  Patient will increase BLE gross strength to 4+/5 as to improve functional strength for independent gait, increased standing tolerance and increased ADL ability.    Baseline  2/18: 4-/5 RLE,     Time  8    Period  Weeks    Status  Partially Met      PT LONG TERM GOAL #5   Title  Patient will increase lower extremity functional scale to >40/80 to demonstrate improved functional mobility and increased tolerance with ADLs.     Baseline  14/80; 12/26: 21/80; 2/18: 28/80     Time  8    Period  Weeks  Status  Partially Met      Additional Long Term Goals   Additional Long Term Goals  Yes      PT LONG TERM GOAL #6   Title  Patient will walk through a door involving one small step up/down to increase functional independence.     Baseline  require Min A and BUE support in // bars at this time     Time  8    Period  Weeks    Status  Partially Met      PT LONG TERM GOAL #7   Title  Patient will ambulate 40 ft with least assistive device and Supervision with no breaks to allow for increased mobility within home.     Baseline  requires use of walker and CGA    Time  8    Period  Weeks    Status  New            Plan - 06/26/17 1507    Clinical Impression Statement  Patient session interrupted with patient needing to use the bathroom. Toileting requiring Min A and was utilized into a transfer, balance, and strength intervention activity to carryover into home life. Patient educated on weight shift at this time and limited due to postural muscle fatigue.  Patient will continue to benefit from skilled physical therapy to improve strength balance and gait safety    Rehab Potential  Fair    Clinical Impairments Affecting Rehab Potential  hx of HTN, HLD, CVA, learning disability, Diabetes, brain tumor,     PT Frequency  2x / week    PT Duration  8 weeks    PT Treatment/Interventions  ADLs/Self Care Home Management;Aquatic Therapy;Ultrasound;Moist Heat;Traction;DME Instruction;Gait training;Stair training;Functional mobility training;Therapeutic activities;Therapeutic exercise;Orthotic Fit/Training;Neuromuscular re-education;Balance training;Patient/family education;Manual techniques;Wheelchair mobility training;Passive range of motion;Energy conservation;Taping;Visual/perceptual remediation/compensation    PT Next Visit Plan  bed mobility, standing strengthening interventions    PT Home Exercise Plan  see sheet    Consulted and Agree with Plan of Care  Patient;Family member/caregiver    Family Member Consulted  mother       Patient will benefit from skilled therapeutic intervention in order to improve the following deficits and impairments:  Abnormal gait, Decreased activity tolerance, Decreased balance, Decreased knowledge of precautions, Decreased endurance, Decreased coordination,  Decreased knowledge of use of DME, Decreased mobility, Decreased range of motion, Difficulty walking, Decreased safety awareness, Decreased strength, Impaired flexibility, Impaired perceived functional ability, Impaired tone, Postural dysfunction, Improper body mechanics, Pain  Visit Diagnosis: Muscle weakness (generalized)  Other lack of coordination  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 Acute CVA (cerebrovascular accident) ([place]) 05/02/2016  . Left-sided weakness 05/01/2016   Janna Arch, PT, DPT   Janna Arch 06/26/2017, 3:16 PM  [place] [facility] MAIN [facility] SERVICES [address] [place], [place], [address] Phone: [phone]   Fax:  [phone]  Name: Curtis Powell MRN: [id number] Date of Birth: 06/11/1965

## 2017-06-30 ENCOUNTER — Ambulatory Visit: Payer: Medicare HMO | Admitting: Occupational Therapy

## 2017-06-30 ENCOUNTER — Ambulatory Visit: Payer: Medicare HMO

## 2017-06-30 ENCOUNTER — Encounter: Payer: Self-pay | Admitting: Occupational Therapy

## 2017-06-30 DIAGNOSIS — R278 Other lack of coordination: Secondary | ICD-10-CM

## 2017-06-30 DIAGNOSIS — I69351 Hemiplegia and hemiparesis following cerebral infarction affecting right dominant side: Secondary | ICD-10-CM

## 2017-06-30 DIAGNOSIS — R2689 Other abnormalities of gait and mobility: Secondary | ICD-10-CM

## 2017-06-30 DIAGNOSIS — M6281 Muscle weakness (generalized): Secondary | ICD-10-CM

## 2017-06-30 NOTE — Therapy (Signed)
[place] MAIN [facility] SERVICES [address] [place], [place], [address] Phone: [phone]   Fax:  [phone]  Occupational Therapy Treatment  Patient Details  Name: Curtis Powell MRN: [id number] Date of Birth: Feb 14, 1966 Referring Provider: Dr. Larinda Buttery   Encounter Date: 06/30/2017  OT End of Session - 06/30/17 1446    Visit Number  22    Number of Visits  36    Date for OT Re-Evaluation  07/30/17    OT Start Time  1430    OT Stop Time  1515    OT Time Calculation (min)  45 min    Activity Tolerance  Patient tolerated treatment well    Behavior During Therapy  [facility] for tasks assessed/performed       Past Medical History:  Diagnosis Date  . GERD (gastroesophageal reflux disease)   . Hyperlipidemia   . Hypertension   . Obesity   . Stroke [facility])     Past Surgical History:  Procedure Laterality Date  . BRAIN SURGERY      There were no vitals filed for this visit.  Subjective Assessment - 06/30/17 1443    Subjective   Pt. reports he is doing well.    Patient is accompained by:  Family member    Pertinent History  Pt. is a 52 y.o. male who suffered a CVA on 05/01/2016. Pt. was admitted to the hospital. Once discharged, he received Home Health PT and OT services for about a month. Pt. has had multiple CVAs over the past 8 years, and has had multiple falls in the past 6 months. Pt. resides in an apartment. Pt. has caregivers for 80 hours. Pt.'s mother stays with pt. at night, and assists with IADL tasks.      Patient Stated Goals  To be able to throw a ball and dribble a ball.    Currently in Pain?  Yes    Pain Score  5     Pain Location  Shoulder    Pain Orientation  Right    Pain Descriptors / Indicators  Aching    Pain Type  Acute pain    Pain Onset  More than a month ago    Pain Frequency  Constant       OT TREATMENT    Therapeutic Exercise:  Pt. Tolerated PROM/AAROM for right shoulder flexion, abduction,  horizontal adduction, and abduction. Pt. worked with red theraband exercises for Left shoulder flexion, diagonal flexion, horizontal abduction, bilateral elbow flexion, and extension.  Self-care:  Pt. Performed toileting tasks while standing at the at the toilet using a grab bar at the left side with CGA. Pt. Is independent with hand hygiene.                        OT Education - 06/30/17 1446    Education provided  Yes    Education Details  UE strength, and coordination skills.    Person(s) Educated  Patient    Methods  Explanation;Demonstration;Verbal cues    Comprehension  Verbalized understanding;Returned demonstration       OT Short Term Goals - 03/03/17 1459      OT SHORT TERM GOAL #1   Title  `        OT Long Term Goals - 05/28/17 1404      OT LONG TERM GOAL #1   Title  Pt. will UE functioning to be able to perfrom toilet hygiene with  minimal assistance.    Baseline  Pt. is unable. 05/07/2017: Pt. continues to occassionally have difficulty.    Time  12    Period  Weeks    Status  On-going    Target Date  07/30/17      OT LONG TERM GOAL #2   Title  Pt. will complete self grooming with minA    Baseline  Pt. requires extensive assist. Pt. is improving with oral care with set-up, however requires extensive assist for self-grooming tasls.    Time  12    Period  Weeks    Status  On-going    Target Date  07/30/17      OT LONG TERM GOAL #3   Title  Pt. will perfrom self-feeding skills with modified indepndence    Baseline  Pt. has difficutly, and performs with increased spillage 05/07/2017: Pt. has difficulty with occassionally spilling.    Time  12    Period  Weeks    Status  On-going    Target Date  07/30/17      OT LONG TERM GOAL #4   Title  Pt. will perform self dressing with minA and A/E as needed.    Baseline  Pt. requires extensive assist: Pt. conitnues to have difficulty perfroming UE,a nd LE dressing.    Time  12    Period  Weeks     Status  On-going    Target Date  07/30/17      OT LONG TERM GOAL #5   Title  Pt. will perfrom light home making tasks with minA    Baseline  Pt. requires extensive assistance 05/07/2017: Pt. continues to have difficulty folding clothes. Pt. is improving with making a sandwich.    Time  12    Period  Weeks    Status  On-going    Target Date  06/01/17      OT LONG TERM GOAL #6   Title  Pt. will write his name efficiently with 100% legibility    Baseline  50% legibility with incresed time required 04/27/2017: Pt. continues to present with difficulty writing legibly.     Time  12    Period  Weeks    Status  On-going    Target Date  07/30/17            Plan - 06/30/17 1447    Clinical Impression Statement Pt. reports he is now preparing light cold meals, and sandwiches for himself from the w/c at home. Pt. is making progress overall. Pt. continues have pesonal care aides for 3 hours a day, 7 days a week. pt. Pt. reports he is hoping to get a voucher soon to be able to get a larger place to live. Pt. reports his w/c does not fit through the bethroom door.    Occupational performance deficits (Please refer to evaluation for details):  ADL's;IADL's    Rehab Potential  Fair    OT Frequency  2x / week    OT Duration  12 weeks    OT Treatment/Interventions  Self-care/ADL training;Patient/family education;DME and/or AE instruction;Therapeutic exercise;Moist Heat    Clinical Decision Making  Several treatment options, min-mod task modification necessary    Consulted and Agree with Plan of Care  Patient       Patient will benefit from skilled therapeutic intervention in order to improve the following deficits and impairments:  Pain, Decreased cognition, Decreased activity tolerance, Impaired UE functional use, Decreased range of motion, Decreased coordination, Decreased endurance  Visit Diagnosis: Muscle weakness (generalized)  Other lack of coordination    Problem List Patient Active  Problem List   Diagnosis Date Noted  . Acute CVA (cerebrovascular accident) ([place]) 05/02/2016  . Left-sided weakness 05/01/2016    Luberta Mutter, OTR/L 06/30/2017, 3:09 PM  [place] MAIN [facility] SERVICES [address] [place], [place], [address] Phone: [phone]   Fax:  [phone]  Name: Curtis Powell MRN: [id number] Date of Birth: 01-08-1966

## 2017-06-30 NOTE — Therapy (Signed)
[place] MAIN [facility] SERVICES [address] [place], [place], [address] Phone: [phone]   Fax:  [phone]  Physical Therapy Treatment  Patient Details  Name: [place] MRN: [id number] Date of Birth: 02/15/1966 Referring Provider: Dr. Larinda Buttery   Encounter Date: 06/30/2017  PT End of Session - 06/30/17 1353    Visit Number  29    Number of Visits  48    Date for PT Re-Evaluation  07/21/17    PT Start Time  6789    PT Stop Time  1430    PT Time Calculation (min)  45 min    Equipment Utilized During Treatment  Gait belt    Activity Tolerance  Patient tolerated treatment well    Behavior During Therapy  [facility] for tasks assessed/performed       Past Medical History:  Diagnosis Date  . GERD (gastroesophageal reflux disease)   . Hyperlipidemia   . Hypertension   . Obesity   . Stroke [facility])     Past Surgical History:  Procedure Laterality Date  . BRAIN SURGERY      There were no vitals filed for this visit.  Subjective Assessment - 06/30/17 1351    Subjective  Patient reports compliance with HEP. Has been watching march madness. Continuing ot have some soreness in shoulders. Reports walking every other day.     Pertinent History   Patient is a pleasant 52 year old male who presents to physical therapy for weakness and immobility secondary to CVA.  Had a stroke in Feb 2018. He was previously fully independent, but this stroke caused severe residual deficits, mainly on the right side as well as speech, and now he is unable to walk or perform most of his ADLs on his own. Entire right side is very weak. He still has a little difficulty with speech but his swallowing is improved to baseline. His mom had to move in with him and now is his main caregiver.     Limitations  Sitting;Lifting;Standing;Walking;House hold activities    How long can you sit comfortably?  5 minutes    How long can you stand comfortably?  5 minutes     How long can you walk comfortably?  10 ft    Patient Stated Goals  Walk without walker, walk further with walker, get some strength in back     Currently in Pain?  Yes    Pain Score  2     Pain Location  Shoulder    Pain Orientation  Right    Pain Descriptors / Indicators  Aching    Pain Type  Acute pain    Pain Onset  More than a month ago    Pain Frequency  Constant         Nustep LE only lvl 2 4 minutes    Standing flexion 10x BLE with BUE support Standing extension 10x BLE with BUE support Standing abduction 10x BLE with BUE support    Forward lunge stretch  2x20 seconds each leg    Standing single limb marching 10x L 10x R BUE support ; RLE collapse due to fatigue   Heel raise weight shift L and RLE to promote weight shift to RLE without knee buckling 10x.    Standing without UE support on airex pad 3 minutes with intermittent UE support and frequent posterior LOB and trunk flexion with fatigue.     Ambulating backwards in// bars, CGA, 2x 8 ft  Standing hamstring curls 10x each leg.    Patient requires frequent seated rest breaks.    Pt. response to medical necessity:  Patient will continue to benefit from skilled physical therapy to improve strength balance and gait safety.          No data recorded               PT Education - 06/30/17 1354    Education provided  Yes    Education Details  LE strength, posture, standing balance, coordination, mobility     Person(s) Educated  Patient    Methods  Explanation;Demonstration;Verbal cues    Comprehension  Verbalized understanding;Returned demonstration       PT Short Term Goals - 05/26/17 1514      PT SHORT TERM GOAL #1   Title  Patient will be independent in home exercise program to improve strength/mobility for better functional independence with ADLs.    Baseline  hep compliant    Time  2    Period  Weeks    Status  Achieved      PT SHORT TERM GOAL #2   Title  Patient will  require min cueing for STS transfer with CGA for increased independence with mobility.     Baseline  CGA    Time  2    Period  Weeks    Status  Achieved      PT SHORT TERM GOAL #3   Title  Patient will maintain upright posture for > 15 seconds to demonstrate strengthened postural control muscualture     Baseline  18 seconds     Time  2    Period  Weeks    Status  Achieved        PT Long Term Goals - 05/26/17 1518      PT LONG TERM GOAL #1   Title  Patient will perform bed mobility with Supervision to increase independent mobility    Baseline  Requires Min A for LE for Sit to Supine, independent with bridging to scoot within bed to L and R    Time  8    Period  Weeks    Status  Partially Met      PT LONG TERM GOAL #2   Title  Patient (< 60 years old) will complete five times sit to stand test in < 10 seconds indicating an increased LE strength and improved balance.    Baseline  2/18: 30 seconds from plinth chair height single UE on seat and single on walker ; 32 seconds from plinth with single UE on seat and single on walker.     Time  8    Period  Weeks    Status  Partially Met      PT LONG TERM GOAL #3   Title  Patient will ambulate 40 ft with walker with no breaks to allow for increased mobility within home.     Baseline  12/26: 29 ft with walker and WC follow with no rest breaks; 2/18: multiple trials previous session of >100 ft     Time  8    Period  Weeks    Status  Achieved      PT LONG TERM GOAL #4   Title  Patient will increase BLE gross strength to 4+/5 as to improve functional strength for independent gait, increased standing tolerance and increased ADL ability.    Baseline  2/18: 4-/5 RLE,     Time  8      Period  Weeks    Status  Partially Met      PT LONG TERM GOAL #5   Title  Patient will increase lower extremity functional scale to >40/80 to demonstrate improved functional mobility and increased tolerance with ADLs.     Baseline  14/80; 12/26: 21/80; 2/18:  28/80     Time  8    Period  Weeks    Status  Partially Met      Additional Long Term Goals   Additional Long Term Goals  Yes      PT LONG TERM GOAL #6   Title  Patient will walk through a door involving one small step up/down to increase functional independence.     Baseline  require Min A and BUE support in // bars at this time    Time  8    Period  Weeks    Status  Partially Met      PT LONG TERM GOAL #7   Title  Patient will ambulate 40 ft with least assistive device and Supervision with no breaks to allow for increased mobility within home.     Baseline  requires use of walker and CGA    Time  8    Period  Weeks    Status  New            Plan - 06/30/17 1416    Clinical Impression Statement  Patient demonstrates improved ability to weight shift onto RLE with decreased episodes of knee buckling. Right leg continues to fatigue quicker than left, however is noticeably prolonged in standing duration today.  Patient will continue to benefit from skilled physical therapy to improve strength balance and gait safety.    Rehab Potential  Fair    Clinical Impairments Affecting Rehab Potential  hx of HTN, HLD, CVA, learning disability, Diabetes, brain tumor,     PT Frequency  2x / week    PT Duration  8 weeks    PT Treatment/Interventions  ADLs/Self Care Home Management;Aquatic Therapy;Ultrasound;Moist Heat;Traction;DME Instruction;Gait training;Stair training;Functional mobility training;Therapeutic activities;Therapeutic exercise;Orthotic Fit/Training;Neuromuscular re-education;Balance training;Patient/family education;Manual techniques;Wheelchair mobility training;Passive range of motion;Energy conservation;Taping;Visual/perceptual remediation/compensation    PT Next Visit Plan  bed mobility, standing strengthening interventions    PT Home Exercise Plan  see sheet    Consulted and Agree with Plan of Care  Patient;Family member/caregiver    Family Member Consulted  mother        Patient will benefit from skilled therapeutic intervention in order to improve the following deficits and impairments:  Abnormal gait, Decreased activity tolerance, Decreased balance, Decreased knowledge of precautions, Decreased endurance, Decreased coordination, Decreased knowledge of use of DME, Decreased mobility, Decreased range of motion, Difficulty walking, Decreased safety awareness, Decreased strength, Impaired flexibility, Impaired perceived functional ability, Impaired tone, Postural dysfunction, Improper body mechanics, Pain  Visit Diagnosis: Muscle weakness (generalized)  Other lack of coordination  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 Acute CVA (cerebrovascular accident) (HCC) 05/02/2016  . Left-sided weakness 05/01/2016   Marina Moser, PT, DPT   Marina Moser 06/30/2017, 2:31 PM  [place] [facility] MAIN REHAB SERVICES [address] [place], [place], [address] Phone: [phone]   Fax:  [phone]  Name: [place] MRN: [id number] Date of Birth: 04/26/1965   

## 2017-07-02 ENCOUNTER — Encounter: Payer: Self-pay | Admitting: Occupational Therapy

## 2017-07-02 ENCOUNTER — Ambulatory Visit: Payer: Medicare HMO

## 2017-07-02 ENCOUNTER — Ambulatory Visit: Payer: Medicare HMO | Admitting: Occupational Therapy

## 2017-07-02 VITALS — BP 173/89 | HR 97 | Temp 97.7°F

## 2017-07-02 DIAGNOSIS — R278 Other lack of coordination: Secondary | ICD-10-CM

## 2017-07-02 DIAGNOSIS — M6281 Muscle weakness (generalized): Secondary | ICD-10-CM

## 2017-07-02 DIAGNOSIS — I69351 Hemiplegia and hemiparesis following cerebral infarction affecting right dominant side: Secondary | ICD-10-CM

## 2017-07-02 DIAGNOSIS — R2689 Other abnormalities of gait and mobility: Secondary | ICD-10-CM

## 2017-07-02 NOTE — Therapy (Signed)
[place] MAIN [facility] SERVICES [address] [place], [place], [address] Phone: [phone]   Fax:  [phone]  Occupational Therapy Treatment  Patient Details  Name: [place] DOBRATZ MRN: [id number] Date of Birth: 1965/08/29 Referring Provider: Dr. Larinda Buttery   Encounter Date: 07/02/2017  OT End of Session - 07/02/17 1309    Visit Number  23    Number of Visits  36    Date for OT Re-Evaluation  07/30/17    OT Start Time  1300    OT Stop Time  1345    OT Time Calculation (min)  45 min    Activity Tolerance  Patient tolerated treatment well    Behavior During Therapy  [facility] for tasks assessed/performed       Past Medical History:  Diagnosis Date  . GERD (gastroesophageal reflux disease)   . Hyperlipidemia   . Hypertension   . Obesity   . Stroke [facility])     Past Surgical History:  Procedure Laterality Date  . BRAIN SURGERY      There were no vitals filed for this visit.  Subjective Assessment - 07/02/17 1306    Subjective   Pt. reports right shoulder pain comes, and goes.    Patient is accompained by:  Family member    Pertinent History  Pt. is a 52 y.o. male who suffered a CVA on 05/01/2016. Pt. was admitted to the hospital. Once discharged, he received Home Health PT and OT services for about a month. Pt. has had multiple CVAs over the past 8 years, and has had multiple falls in the past 6 months. Pt. resides in an apartment. Pt. has caregivers for 80 hours. Pt.'s mother stays with pt. at night, and assists with IADL tasks.      Patient Stated Goals  To be able to throw a ball and dribble a ball.    Pain Score  5     Pain Location  Shoulder    Pain Orientation  Right    Pain Descriptors / Indicators  Aching       OT TREATMENT    Therapeutic Exercise:  Pt. tolerated PROM, and AROM for right shoulder flexion, abduction,and horizontal abduction. Pt. Responded well to the slow gentle ROM, and stretching with decreased  pain following ROM. Pt. worked on [place] there. Ex. with red theraband for left shoulder flexion, abduction, diagonal abduction, bilateral elbow flexion, and extension.    Self-care:   Pt. worked on IADL kitchen tasks standing at the counter with a walker in preparation for countertop tasks. Pt. Worked worked on [employer], and appliances.                       OT Education - 07/02/17 1308    Education provided  Yes    Education Details  UE functioning, coordination skills    Person(s) Educated  Patient    Methods  Explanation;Demonstration;Verbal cues    Comprehension  Verbalized understanding;Returned demonstration       OT Short Term Goals - 03/03/17 1459      OT SHORT TERM GOAL #1   Title  `        OT Long Term Goals - 05/28/17 1404      OT LONG TERM GOAL #1   Title  Pt. will UE functioning to be able to perfrom toilet hygiene with minimal assistance.    Baseline  Pt. is unable. 05/07/2017: Pt. continues to occassionally  have difficulty.    Time  12    Period  Weeks    Status  On-going    Target Date  07/30/17      OT LONG TERM GOAL #2   Title  Pt. will complete self grooming with minA    Baseline  Pt. requires extensive assist. Pt. is improving with oral care with set-up, however requires extensive assist for self-grooming tasls.    Time  12    Period  Weeks    Status  On-going    Target Date  07/30/17      OT LONG TERM GOAL #3   Title  Pt. will perfrom self-feeding skills with modified indepndence    Baseline  Pt. has difficutly, and performs with increased spillage 05/07/2017: Pt. has difficulty with occassionally spilling.    Time  12    Period  Weeks    Status  On-going    Target Date  07/30/17      OT LONG TERM GOAL #4   Title  Pt. will perform self dressing with minA and A/E as needed.    Baseline  Pt. requires extensive assist: Pt. conitnues to have difficulty perfroming UE,a nd LE dressing.    Time  12    Period  Weeks     Status  On-going    Target Date  07/30/17      OT LONG TERM GOAL #5   Title  Pt. will perfrom light home making tasks with minA    Baseline  Pt. requires extensive assistance 05/07/2017: Pt. continues to have difficulty folding clothes. Pt. is improving with making a sandwich.    Time  12    Period  Weeks    Status  On-going    Target Date  06/01/17      OT LONG TERM GOAL #6   Title  Pt. will write his name efficiently with 100% legibility    Baseline  50% legibility with incresed time required 04/27/2017: Pt. continues to present with difficulty writing legibly.     Time  12    Period  Weeks    Status  On-going    Target Date  07/30/17            Plan - 07/02/17 1310    Clinical Impression Statement Pt. reports he is consistently is doing more at home with preparing light meals. Pt. reports that the place he is living in now is not w/c accessible, and he has a difficult time getting through the doors. Pt. pain in the right shoulder is intermittent. Pt. had pain initially, however improved with ROM. Pt. reported no pain following exercises.  (Pended)     Occupational performance deficits (Please refer to evaluation for details):  ADL's;IADL's    Rehab Potential  Fair    OT Frequency  2x / week    OT Duration  12 weeks    OT Treatment/Interventions  Self-care/ADL training;Patient/family education;DME and/or AE instruction;Therapeutic exercise;Moist Heat    Clinical Decision Making  Several treatment options, min-mod task modification necessary    Consulted and Agree with Plan of Care  Patient       Patient will benefit from skilled therapeutic intervention in order to improve the following deficits and impairments:  Pain, Decreased cognition, Decreased activity tolerance, Impaired UE functional use, Decreased range of motion, Decreased coordination, Decreased endurance  Visit Diagnosis: Muscle weakness (generalized)    Problem List Patient Active Problem List   Diagnosis  Date Noted  .  Acute CVA (cerebrovascular accident) ([place]) 05/02/2016  . Left-sided weakness 05/01/2016    Curtis Carina, MS, OTR/L 07/02/2017, 5:42 PM  [place] MAIN [facility] SERVICES [address] [place], [place], [address] Phone: [phone]   Fax:  [phone]  Name: DEZMON CONOVER MRN: [id number] Date of Birth: 06-Mar-1966

## 2017-07-02 NOTE — Therapy (Signed)
[place] MAIN [facility] SERVICES [address] [place], [place], [address] Phone: [phone]   Fax:  [phone]  Physical Therapy Treatment  Patient Details  Name: [place] MRN: [id number] Date of Birth: 04/22/1965 Referring Provider: Dr. Larinda Buttery   Encounter Date: 07/02/2017  PT End of Session - 07/02/17 1418    Visit Number  30    Number of Visits  48    Date for PT Re-Evaluation  07/21/17    Authorization Type  next will be 1/10     PT Start Time  1347    PT Stop Time  1430    PT Time Calculation (min)  43 min    Equipment Utilized During Treatment  Gait belt    Activity Tolerance  Patient tolerated treatment well;No increased pain    Behavior During Therapy  WFL for tasks assessed/performed       Past Medical History:  Diagnosis Date  . GERD (gastroesophageal reflux disease)   . Hyperlipidemia   . Hypertension   . Obesity   . Stroke [facility])     Past Surgical History:  Procedure Laterality Date  . BRAIN SURGERY      There were no vitals filed for this visit.  Subjective Assessment - 07/02/17 1353    Subjective  Pt doing well today. Just finished OT session. Reports no major changes since last visit. HEP is going well.     Pertinent History   Patient is a pleasant 52 year old male who presents to physical therapy for weakness and immobility secondary to CVA.  Had a stroke in Feb 2018. He was previously fully independent, but this stroke caused severe residual deficits, mainly on the right side as well as speech, and now he is unable to walk or perform most of his ADLs on his own. Entire right side is very weak. He still has a little difficulty with speech but his swallowing is improved to baseline. His mom had to move in with him and now is his main caregiver.     Currently in Pain?  Yes    Pain Score  0-No pain            Intervention This Date:  -Normal Stance Balance on Airex pad, 2x30sec, unable to  establish COM over BOS, constant retropulsion -Normal stance on firm surface: 2x30sec, same problem as above, otherwise steady -Heel raises with forward weight shift over //bar, 2x15, VC for full available ROM in ankles and tactile cues for forward weight shift of pelvis.  -3x5STS xfers, chair+airex, hands free: tactile and verbal cues for form, forward weiht shift.  -Lateral side stepping in //bars, 2x42fet bilat (takes about 4 minutes each effort, and rest thereafter)               PT Short Term Goals - 05/26/17 1514      PT SHORT TERM GOAL #1   Title  Patient will be independent in home exercise program to improve strength/mobility for better functional independence with ADLs.    Baseline  hep compliant    Time  2    Period  Weeks    Status  Achieved      PT SHORT TERM GOAL #2   Title  Patient will require min cueing for STS transfer with CGA for increased independence with mobility.     Baseline  CGA    Time  2    Period  Weeks    Status  Achieved      PT SHORT TERM GOAL #3   Title  Patient will maintain upright posture for > 15 seconds to demonstrate strengthened postural control muscualture     Baseline  18 seconds     Time  2    Period  Weeks    Status  Achieved        PT Long Term Goals - 05/26/17 1518      PT LONG TERM GOAL #1   Title  Patient will perform bed mobility with Supervision to increase independent mobility    Baseline  Requires Min A for LE for Sit to Supine, independent with bridging to scoot within bed to L and R    Time  8    Period  Weeks    Status  Partially Met      PT LONG TERM GOAL #2   Title  Patient (< 61 years old) will complete five times sit to stand test in < 10 seconds indicating an increased LE strength and improved balance.    Baseline  2/18: 30 seconds from plinth chair height single UE on seat and single on walker ; 32 seconds from plinth with single UE on seat and single on walker.     Time  8    Period  Weeks     Status  Partially Met      PT LONG TERM GOAL #3   Title  Patient will ambulate 40 ft with walker with no breaks to allow for increased mobility within home.     Baseline  12/26: 29 ft with walker and WC follow with no rest breaks; 2/18: multiple trials previous session of >100 ft     Time  8    Period  Weeks    Status  Achieved      PT LONG TERM GOAL #4   Title  Patient will increase BLE gross strength to 4+/5 as to improve functional strength for independent gait, increased standing tolerance and increased ADL ability.    Baseline  2/18: 4-/5 RLE,     Time  8    Period  Weeks    Status  Partially Met      PT LONG TERM GOAL #5   Title  Patient will increase lower extremity functional scale to >40/80 to demonstrate improved functional mobility and increased tolerance with ADLs.     Baseline  14/80; 12/26: 21/80; 2/18: 28/80     Time  8    Period  Weeks    Status  Partially Met      Additional Long Term Goals   Additional Long Term Goals  Yes      PT LONG TERM GOAL #6   Title  Patient will walk through a door involving one small step up/down to increase functional independence.     Baseline  require Min A and BUE support in // bars at this time    Time  8    Period  Weeks    Status  Partially Met      PT LONG TERM GOAL #7   Title  Patient will ambulate 40 ft with least assistive device and Supervision with no breaks to allow for increased mobility within home.     Baseline  requires use of walker and CGA    Time  8    Period  Weeks    Status  New            Plan -  07/02/17 1419    Clinical Impression Statement  Pt showing good improvement with establishing balance in session, but strongl ylimited by hip flexion tightness, as well as anterior compartment weakness in the setting of plantarflexion spasticity, inititally confused for PF weakness and reluctance to forward weight shift. Pt weight shifts forward very well from [facility] prior to rise from chair, and upon 3rd set of 5  is able to do so without UE use and without physical assistance, requires significant aditional time, but more for balance control, rather than fatigue. Pt makign good progress overall.     Rehab Potential  Fair    Clinical Impairments Affecting Rehab Potential  hx of HTN, HLD, CVA, learning disability, Diabetes, brain tumor,     PT Frequency  2x / week    PT Duration  8 weeks    PT Treatment/Interventions  ADLs/Self Care Home Management;Aquatic Therapy;Ultrasound;Moist Heat;Traction;DME Instruction;Gait training;Stair training;Functional mobility training;Therapeutic activities;Therapeutic exercise;Orthotic Fit/Training;Neuromuscular re-education;Balance training;Patient/family education;Manual techniques;Wheelchair mobility training;Passive range of motion;Energy conservation;Taping;Visual/perceptual remediation/compensation    PT Next Visit Plan  bed mobility, standing strengthening interventions    PT Home Exercise Plan  see sheet    Consulted and Agree with Plan of Care  Patient;Family member/caregiver       Patient will benefit from skilled therapeutic intervention in order to improve the following deficits and impairments:  Abnormal gait, Decreased activity tolerance, Decreased balance, Decreased knowledge of precautions, Decreased endurance, Decreased coordination, Decreased knowledge of use of DME, Decreased mobility, Decreased range of motion, Difficulty walking, Decreased safety awareness, Decreased strength, Impaired flexibility, Impaired perceived functional ability, Impaired tone, Postural dysfunction, Improper body mechanics, Pain  Visit Diagnosis: Muscle weakness (generalized)  Other lack of coordination  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 Acute CVA (cerebrovascular accident) ([place]) 05/02/2016  .  Left-sided weakness 05/01/2016   2:36 PM, 07/02/17 Etta Grandchild, PT, DPT Physical Therapist - [facility]  [phone] ([address])    Jer[place]o C 07/02/2017, 2:28 PM  [place] MAIN [facility] SERVICES [address] [place], [place], [address] Phone: [phone]   Fax:  [phone]  Name: [place] MRN: [id number] Date of Birth: 1965-09-22

## 2017-07-08 ENCOUNTER — Encounter: Payer: Self-pay | Admitting: Occupational Therapy

## 2017-07-08 ENCOUNTER — Ambulatory Visit: Payer: Medicare HMO | Attending: Family Medicine | Admitting: Occupational Therapy

## 2017-07-08 ENCOUNTER — Ambulatory Visit: Payer: Medicare HMO

## 2017-07-08 DIAGNOSIS — R278 Other lack of coordination: Secondary | ICD-10-CM | POA: Diagnosis present

## 2017-07-08 DIAGNOSIS — R2689 Other abnormalities of gait and mobility: Secondary | ICD-10-CM | POA: Diagnosis present

## 2017-07-08 DIAGNOSIS — M6281 Muscle weakness (generalized): Secondary | ICD-10-CM | POA: Diagnosis not present

## 2017-07-08 DIAGNOSIS — I69351 Hemiplegia and hemiparesis following cerebral infarction affecting right dominant side: Secondary | ICD-10-CM | POA: Diagnosis present

## 2017-07-08 NOTE — Therapy (Signed)
[place] MAIN [facility] SERVICES [address] [place], [place], [address] Phone: [phone]   Fax:  [phone]  Physical Therapy Treatment  Patient Details  Name: Curtis Powell MRN: [id number] Date of Birth: 1965-09-14 Referring Provider: Dr. Larinda Buttery   Encounter Date: 07/08/2017  PT End of Session - 07/08/17 1349    Visit Number  31    Number of Visits  48    Date for PT Re-Evaluation  07/21/17    Authorization Type  next will be 1/10     PT Start Time  1342    PT Stop Time  1430    PT Time Calculation (min)  48 min    Equipment Utilized During Treatment  Gait belt    Activity Tolerance  Patient tolerated treatment well;No increased pain    Behavior During Therapy  WFL for tasks assessed/performed       Past Medical History:  Diagnosis Date  . GERD (gastroesophageal reflux disease)   . Hyperlipidemia   . Hypertension   . Obesity   . Stroke [facility])     Past Surgical History:  Procedure Laterality Date  . BRAIN SURGERY      There were no vitals filed for this visit.  Subjective Assessment - 07/08/17 1347    Subjective  Patient reports doing HEP and walking between sessions. Reports no stumbles or falls since last session.     Pertinent History   Patient is a pleasant 52 year old male who presents to physical therapy for weakness and immobility secondary to CVA.  Had a stroke in Feb 2018. He was previously fully independent, but this stroke caused severe residual deficits, mainly on the right side as well as speech, and now he is unable to walk or perform most of his ADLs on his own. Entire right side is very weak. He still has a little difficulty with speech but his swallowing is improved to baseline. His mom had to move in with him and now is his main caregiver.     Limitations  Sitting;Lifting;Standing;Walking;House hold activities    How long can you sit comfortably?  5 minutes    How long can you stand  comfortably?  5 minutes    How long can you walk comfortably?  10 ft    Patient Stated Goals  Walk without walker, walk further with walker, get some strength in back     Currently in Pain?  Yes    Pain Score  5     Pain Location  Shoulder    Pain Orientation  Right    Pain Descriptors / Indicators  Aching    Pain Type  Acute pain          Nustep LE only lvl 2 4 minutes; cues for keeping SPM>60 for increased cardiovascular support    Standing: cues for bringing belly button to mirror for proper pelvic tilt for upright alignment   Standing extension 2x 10x BLE with BUE support  Standing abduction 2x 10x BLE with BUE support    Forward lunge stretch  3x30 seconds each leg ; cues to bring belly button to the mirror to increase hip flexor stretch   Standing single limb marching 10x L 10x R BUE support ; RLE collapse due to fatigue; 2 sets, cues for upright posture and decreased support from arms.    Heel raise weight shift L and RLE to promote weight shift to RLE without knee buckling 10x.  Standing without UE support on airex pad 3 minutes with intermittent UE support and frequent posterior LOB and trunk flexion with fatigue.    Airex pad balance: 2x45 seconds with occasional hand touch for support: trunk flexion noted.    Ambulating backwards in// bars, CGA, 2x 8 ft    Standing hamstring curls 2x10x each leg. Cues for upright posture via re-alignment of pelvis.    Patient requires frequent seated rest breaks.    Pt. response to medical necessity:  Patient will continue to benefit from skilled physical therapy to improve strength balance and gait safety.                      PT Education - 07/08/17 1348    Education provided  Yes    Education Details  LE strength, coordination, exercise technique     Person(s) Educated  Patient    Methods  Explanation;Demonstration;Verbal cues    Comprehension  Verbalized understanding;Returned demonstration       PT  Short Term Goals - 05/26/17 1514      PT SHORT TERM GOAL #1   Title  Patient will be independent in home exercise program to improve strength/mobility for better functional independence with ADLs.    Baseline  hep compliant    Time  2    Period  Weeks    Status  Achieved      PT SHORT TERM GOAL #2   Title  Patient will require min cueing for STS transfer with CGA for increased independence with mobility.     Baseline  CGA    Time  2    Period  Weeks    Status  Achieved      PT SHORT TERM GOAL #3   Title  Patient will maintain upright posture for > 15 seconds to demonstrate strengthened postural control muscualture     Baseline  18 seconds     Time  2    Period  Weeks    Status  Achieved        PT Long Term Goals - 05/26/17 1518      PT LONG TERM GOAL #1   Title  Patient will perform bed mobility with Supervision to increase independent mobility    Baseline  Requires Min A for LE for Sit to Supine, independent with bridging to scoot within bed to L and R    Time  8    Period  Weeks    Status  Partially Met      PT LONG TERM GOAL #2   Title  Patient (< 68 years old) will complete five times sit to stand test in < 10 seconds indicating an increased LE strength and improved balance.    Baseline  2/18: 30 seconds from plinth chair height single UE on seat and single on walker ; 32 seconds from plinth with single UE on seat and single on walker.     Time  8    Period  Weeks    Status  Partially Met      PT LONG TERM GOAL #3   Title  Patient will ambulate 40 ft with walker with no breaks to allow for increased mobility within home.     Baseline  12/26: 29 ft with walker and WC follow with no rest breaks; 2/18: multiple trials previous session of >100 ft     Time  8    Period  Weeks    Status  Achieved  PT LONG TERM GOAL #4   Title  Patient will increase BLE gross strength to 4+/5 as to improve functional strength for independent gait, increased standing tolerance and  increased ADL ability.    Baseline  2/18: 4-/5 RLE,     Time  8    Period  Weeks    Status  Partially Met      PT LONG TERM GOAL #5   Title  Patient will increase lower extremity functional scale to >40/80 to demonstrate improved functional mobility and increased tolerance with ADLs.     Baseline  14/80; 12/26: 21/80; 2/18: 28/80     Time  8    Period  Weeks    Status  Partially Met      Additional Long Term Goals   Additional Long Term Goals  Yes      PT LONG TERM GOAL #6   Title  Patient will walk through a door involving one small step up/down to increase functional independence.     Baseline  require Min A and BUE support in // bars at this time    Time  8    Period  Weeks    Status  Partially Met      PT LONG TERM GOAL #7   Title  Patient will ambulate 40 ft with least assistive device and Supervision with no breaks to allow for increased mobility within home.     Baseline  requires use of walker and CGA    Time  8    Period  Weeks    Status  New            Plan - 07/08/17 1413    Clinical Impression Statement  Patient challenged performing static balance on unstable surface with excessive trunk flexion.  Patient has preference for trunk flexed posture due to seated positioning resulting in tight hip flexors bilaterally. Patient demonstrated increased ability to increase hip flexion when marching for carryover to ambulatory mechanics to decrease foot drag. Patient will continue to benefit from skilled physical therapy to improve strength balance, and gait safety.     Rehab Potential  Fair    Clinical Impairments Affecting Rehab Potential  hx of HTN, HLD, CVA, learning disability, Diabetes, brain tumor,     PT Frequency  2x / week    PT Duration  8 weeks    PT Treatment/Interventions  ADLs/Self Care Home Management;Aquatic Therapy;Ultrasound;Moist Heat;Traction;DME Instruction;Gait training;Stair training;Functional mobility training;Therapeutic activities;Therapeutic  exercise;Orthotic Fit/Training;Neuromuscular re-education;Balance training;Patient/family education;Manual techniques;Wheelchair mobility training;Passive range of motion;Energy conservation;Taping;Visual/perceptual remediation/compensation    PT Next Visit Plan  bed mobility, standing strengthening interventions    PT Home Exercise Plan  see sheet    Consulted and Agree with Plan of Care  Patient;Family member/caregiver       Patient will benefit from skilled therapeutic intervention in order to improve the following deficits and impairments:  Abnormal gait, Decreased activity tolerance, Decreased balance, Decreased knowledge of precautions, Decreased endurance, Decreased coordination, Decreased knowledge of use of DME, Decreased mobility, Decreased range of motion, Difficulty walking, Decreased safety awareness, Decreased strength, Impaired flexibility, Impaired perceived functional ability, Impaired tone, Postural dysfunction, Improper body mechanics, Pain  Visit Diagnosis: Muscle weakness (generalized)  Other lack of coordination  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 Acute CVA (cerebrovascular accident) ([place]) 05/02/2016  . Left-sided  weakness 05/01/2016   Janna Arch, PT, DPT   Janna Arch 07/08/2017, 2:31 PM  [place] MAIN [facility] SERVICES [address] [place], [place], [address] Phone: [phone]   Fax:  [phone]  Name: Curtis Powell MRN: [id number] Date of Birth: 04-22-1965

## 2017-07-10 ENCOUNTER — Ambulatory Visit: Payer: Medicare HMO

## 2017-07-10 DIAGNOSIS — I69351 Hemiplegia and hemiparesis following cerebral infarction affecting right dominant side: Secondary | ICD-10-CM

## 2017-07-10 DIAGNOSIS — M6281 Muscle weakness (generalized): Secondary | ICD-10-CM

## 2017-07-10 DIAGNOSIS — R2689 Other abnormalities of gait and mobility: Secondary | ICD-10-CM

## 2017-07-10 DIAGNOSIS — R278 Other lack of coordination: Secondary | ICD-10-CM

## 2017-07-10 NOTE — Therapy (Signed)
[place] MAIN [facility] SERVICES [address] [place], [place], [address] Phone: [phone]   Fax:  [phone]  Physical Therapy Treatment  Patient Details  Name: Curtis Powell MRN: [id number] Date of Birth: [birth year] Referring Provider: Dr. Larinda Buttery   Encounter Date: 07/10/2017  PT End of Session - 07/10/17 1434    Visit Number  32    Number of Visits  48    Date for PT Re-Evaluation  07/21/17    Authorization Type  next will be 1/10     PT Start Time  1430    PT Stop Time  1515    PT Time Calculation (min)  45 min    Equipment Utilized During Treatment  Gait belt    Activity Tolerance  Patient tolerated treatment well;No increased pain    Behavior During Therapy  WFL for tasks assessed/performed       Past Medical History:  Diagnosis Date  . GERD (gastroesophageal reflux disease)   . Hyperlipidemia   . Hypertension   . Obesity   . Stroke [facility])     Past Surgical History:  Procedure Laterality Date  . BRAIN SURGERY      There were no vitals filed for this visit.  Subjective Assessment - 07/10/17 1433    Subjective  Patient reports walking and doing HEP since last session, no stumbles or falls. No complaints or concerns.     Pertinent History   Patient is a pleasant 52 year old male who presents to physical therapy for weakness and immobility secondary to CVA.  Had a stroke in Feb 2018. He was previously fully independent, but this stroke caused severe residual deficits, mainly on the right side as well as speech, and now he is unable to walk or perform most of his ADLs on his own. Entire right side is very weak. He still has a little difficulty with speech but his swallowing is improved to baseline. His mom had to move in with him and now is his main caregiver.     Limitations  Sitting;Lifting;Standing;Walking;House hold activities    How long can you sit comfortably?  5 minutes    How long can you stand  comfortably?  5 minutes    How long can you walk comfortably?  10 ft    Patient Stated Goals  Walk without walker, walk further with walker, get some strength in back     Currently in Pain?  Yes    Pain Score  5     Pain Location  Shoulder    Pain Orientation  Right    Pain Descriptors / Indicators  Aching    Pain Type  Acute pain       Ambulate outside with RW and CGA. Cues for increasing hip flexion for foot clearance across unstable pathway to avoid obstacles. Ambulate turning L and R around flower beds and grass x 160 ft with 1 seated rest break. Verbal cues required for chest positioning to cue for increased upright posture for improved BOS and step length.   Sit to stand from [facility] with RW and CGA x 5 with good forwards trunk mobility and pressing through legs.   Seated LAQ x 10 each leg with 3 second holds       Patient demonstrates improved ambulatory mechanics with decreased LOB and improved negotiation of obstacles. Patient                   PT  Education - 07/10/17 1434    Education provided  Yes    Education Details  LE strength, coordination, ambulation mechanics    Person(s) Educated  Patient    Methods  Explanation;Demonstration;Verbal cues    Comprehension  Verbalized understanding;Returned demonstration       PT Short Term Goals - 05/26/17 1514      PT SHORT TERM GOAL #1   Title  Patient will be independent in home exercise program to improve strength/mobility for better functional independence with ADLs.    Baseline  hep compliant    Time  2    Period  Weeks    Status  Achieved      PT SHORT TERM GOAL #2   Title  Patient will require min cueing for STS transfer with CGA for increased independence with mobility.     Baseline  CGA    Time  2    Period  Weeks    Status  Achieved      PT SHORT TERM GOAL #3   Title  Patient will maintain upright posture for > 15 seconds to demonstrate strengthened postural control muscualture     Baseline   18 seconds     Time  2    Period  Weeks    Status  Achieved        PT Long Term Goals - 05/26/17 1518      PT LONG TERM GOAL #1   Title  Patient will perform bed mobility with Supervision to increase independent mobility    Baseline  Requires Min A for LE for Sit to Supine, independent with bridging to scoot within bed to L and R    Time  8    Period  Weeks    Status  Partially Met      PT LONG TERM GOAL #2   Title  Patient (< 22 years old) will complete five times sit to stand test in < 10 seconds indicating an increased LE strength and improved balance.    Baseline  2/18: 30 seconds from plinth chair height single UE on seat and single on walker ; 32 seconds from plinth with single UE on seat and single on walker.     Time  8    Period  Weeks    Status  Partially Met      PT LONG TERM GOAL #3   Title  Patient will ambulate 40 ft with walker with no breaks to allow for increased mobility within home.     Baseline  12/26: 29 ft with walker and WC follow with no rest breaks; 2/18: multiple trials previous session of >100 ft     Time  8    Period  Weeks    Status  Achieved      PT LONG TERM GOAL #4   Title  Patient will increase BLE gross strength to 4+/5 as to improve functional strength for independent gait, increased standing tolerance and increased ADL ability.    Baseline  2/18: 4-/5 RLE,     Time  8    Period  Weeks    Status  Partially Met      PT LONG TERM GOAL #5   Title  Patient will increase lower extremity functional scale to >40/80 to demonstrate improved functional mobility and increased tolerance with ADLs.     Baseline  14/80; 12/26: 21/80; 2/18: 28/80     Time  8    Period  Weeks  Status  Partially Met      Additional Long Term Goals   Additional Long Term Goals  Yes      PT LONG TERM GOAL #6   Title  Patient will walk through a door involving one small step up/down to increase functional independence.     Baseline  require Min A and BUE support in  // bars at this time    Time  8    Period  Weeks    Status  Partially Met      PT LONG TERM GOAL #7   Title  Patient will ambulate 40 ft with least assistive device and Supervision with no breaks to allow for increased mobility within home.     Baseline  requires use of walker and CGA    Time  8    Period  Weeks    Status  New              Patient will benefit from skilled therapeutic intervention in order to improve the following deficits and impairments:     Visit Diagnosis: Muscle weakness (generalized)  Other lack of coordination  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 Acute CVA (cerebrovascular accident) ([place]) 05/02/2016  . Left-sided weakness 05/01/2016  Janna Arch, PT, DPT    Janna Arch 07/10/2017, 3:15 PM  [place] [facility] MAIN [facility] SERVICES [address] [place], [place], [address] Phone: [phone]   Fax:  [phone]  Name: OMEED OSUNA MRN: [id number] Date of Birth: December 07, 1965

## 2017-07-12 NOTE — Therapy (Signed)
[name] MAIN [facility] SERVICES [address] [place], [place], [address] Phone: [phone]   Fax:  [phone]  Occupational Therapy Treatment  Patient Details  Name: Curtis Powell MRN: [id number] Date of Birth: 01/13/1966 Referring Provider: Dr. Larinda Buttery   Encounter Date: 07/08/2017  OT End of Session - 07/12/17 1439    Visit Number  24    Number of Visits  36    Date for OT Re-Evaluation  07/30/17    OT Start Time  6962    OT Stop Time  1530    OT Time Calculation (min)  45 min    Activity Tolerance  Patient tolerated treatment well    Behavior During Therapy  [facility] for tasks assessed/performed       Past Medical History:  Diagnosis Date  . GERD (gastroesophageal reflux disease)   . Hyperlipidemia   . Hypertension   . Obesity   . Stroke [facility])     Past Surgical History:  Procedure Laterality Date  . BRAIN SURGERY      There were no vitals filed for this visit.  Subjective Assessment - 07/12/17 1411    Subjective   Patient reports his right shoulder hurts more when he has to roll himself in the wheelchair.      Pertinent History  Pt. is a 52 y.o. male who suffered a CVA on 05/01/2016. Pt. was admitted to the hospital. Once discharged, he received Home Health PT and OT services for about a month. Pt. has had multiple CVAs over the past 8 years, and has had multiple falls in the past 6 months. Pt. resides in an apartment. Pt. has caregivers for 80 hours. Pt.'s mother stays with pt. at night, and assists with IADL tasks.      Patient Stated Goals  To be able to throw a ball and dribble a ball.    Currently in Pain?  Yes    Pain Score  5     Pain Location  Shoulder    Pain Orientation  Right    Pain Descriptors / Indicators  Aching    Pain Type  Acute pain    Pain Onset  More than a month ago    Pain Frequency  Intermittent    Multiple Pain Sites  No                   OT Treatments/Exercises (OP) -  07/12/17 1437      Neurological Re-education Exercises   Other Exercises 1  1# dowel in vertical position on table and "climbing" with his hands to reach as far as he can, able to make it almost to the top with right hand.  Multiple repetitions and sets completed. 1# dowel for chest press, ABD/ADD, forwards circle, backwards circle, for 10 repetitions for one set each.     Other Exercises 2  Patient seen for reaching tasks with Velcro resistive squares to remove and place onto board placed in variety of planes of motion.  Manipulation of grooved pegs into board with cues for prehension patterns.              OT Education - 07/12/17 1438    Education provided  Yes    Education Details  HEP for strength and coordination    Person(s) Educated  Patient    Methods  Explanation;Demonstration;Verbal cues    Comprehension  Verbal cues required;Returned demonstration;Verbalized understanding       OT Short  Term Goals - 03/03/17 1459      OT SHORT TERM GOAL #1   Title  `        OT Long Term Goals - 07/12/17 1440      OT LONG TERM GOAL #1   Title  Pt. will UE functioning to be able to perfrom toilet hygiene with minimal assistance.    Baseline  Pt. is unable. 05/07/2017: Pt. continues to occassionally have difficulty.    Time  12    Period  Weeks    Status  On-going      OT LONG TERM GOAL #2   Title  Pt. will complete self grooming with minA    Baseline  Pt. requires extensive assist. Pt. is improving with oral care with set-up, however requires extensive assist for self-grooming tasls.    Time  12    Period  Weeks    Status  On-going      OT LONG TERM GOAL #3   Title  Pt. will perfrom self-feeding skills with modified indepndence    Baseline  Pt. has difficutly, and performs with increased spillage 05/07/2017: Pt. has difficulty with occassionally spilling.    Time  12    Period  Weeks    Status  On-going      OT LONG TERM GOAL #4   Title  Pt. will perform self dressing  with minA and A/E as needed.    Baseline  Pt. requires extensive assist: Pt. conitnues to have difficulty perfroming UE,a nd LE dressing.    Time  12    Period  Weeks    Status  On-going      OT LONG TERM GOAL #5   Title  Pt. will perfrom light home making tasks with minA    Baseline  Pt. requires extensive assistance 05/07/2017: Pt. continues to have difficulty folding clothes. Pt. is improving with making a sandwich.    Time  12    Period  Weeks    Status  On-going      OT LONG TERM GOAL #6   Title  Pt. will write his name efficiently with 100% legibility    Baseline  50% legibility with incresed time required 04/27/2017: Pt. continues to present with difficulty writing legibly.     Time  12    Period  Weeks    Status  On-going            Plan - 07/12/17 1439    Clinical Impression Statement  Patient seen for reaching tasks with Velcro resistive squares to remove and place onto board placed in variety of planes of motion.  Manipulation of grooved pegs into board with cues for prehension patterns.     Occupational performance deficits (Please refer to evaluation for details):  ADL's;IADL's    Rehab Potential  Fair    OT Frequency  2x / week    OT Duration  12 weeks    OT Treatment/Interventions  Self-care/ADL training;Patient/family education;DME and/or AE instruction;Therapeutic exercise;Moist Heat    Consulted and Agree with Plan of Care  Patient       Patient will benefit from skilled therapeutic intervention in order to improve the following deficits and impairments:  Pain, Decreased cognition, Decreased activity tolerance, Impaired UE functional use, Decreased range of motion, Decreased coordination, Decreased endurance  Visit Diagnosis: Muscle weakness (generalized)  Other lack of coordination  Hemiplegia and hemiparesis following cerebral infarction affecting right dominant side [facility])    Problem List Patient Active  Problem List   Diagnosis Date Noted  . Acute  CVA (cerebrovascular accident) ([place]) 05/02/2016  . Left-sided weakness 05/01/2016   Neisha Hinger T [name], OTR/L, CLT  Adelina Collard 07/12/2017, 2:41 PM  [place] MAIN [facility] SERVICES [address] [place], [place], [address] Phone: [phone]   Fax:  [phone]  Name: Curtis Powell MRN: [id number] Date of Birth: 10/13/1965

## 2017-07-14 ENCOUNTER — Ambulatory Visit: Payer: Medicare HMO | Admitting: Occupational Therapy

## 2017-07-14 ENCOUNTER — Ambulatory Visit: Payer: Medicare HMO

## 2017-07-14 DIAGNOSIS — M6281 Muscle weakness (generalized): Secondary | ICD-10-CM | POA: Diagnosis not present

## 2017-07-14 DIAGNOSIS — I69351 Hemiplegia and hemiparesis following cerebral infarction affecting right dominant side: Secondary | ICD-10-CM

## 2017-07-14 DIAGNOSIS — R278 Other lack of coordination: Secondary | ICD-10-CM

## 2017-07-14 DIAGNOSIS — R2689 Other abnormalities of gait and mobility: Secondary | ICD-10-CM

## 2017-07-14 NOTE — Therapy (Signed)
[place] MAIN [facility] SERVICES [address] [place], [place], [address] Phone: [phone]   Fax:  [phone]  Physical Therapy Treatment  Patient Details  Name: Curtis Powell MRN: [id number] Date of Birth: 1965/05/04 Referring Provider: Dr. Larinda Buttery   Encounter Date: 07/14/2017  PT End of Session - 07/14/17 1435    Visit Number  33    Number of Visits  48    Date for PT Re-Evaluation  07/21/17    Authorization Type  next will be 1/10     PT Start Time  1430    PT Stop Time  1515    PT Time Calculation (min)  45 min    Equipment Utilized During Treatment  Gait belt    Activity Tolerance  Patient tolerated treatment well;No increased pain    Behavior During Therapy  WFL for tasks assessed/performed       Past Medical History:  Diagnosis Date  . GERD (gastroesophageal reflux disease)   . Hyperlipidemia   . Hypertension   . Obesity   . Stroke [facility])     Past Surgical History:  Procedure Laterality Date  . BRAIN SURGERY      There were no vitals filed for this visit.  Subjective Assessment - 07/14/17 1434    Subjective  Patient reports walking back and forth to kitchen multiple times since last session. No stumbles or falls.     Pertinent History   Patient is a pleasant 52 year old male who presents to physical therapy for weakness and immobility secondary to CVA.  Had a stroke in Feb 2018. He was previously fully independent, but this stroke caused severe residual deficits, mainly on the right side as well as speech, and now he is unable to walk or perform most of his ADLs on his own. Entire right side is very weak. He still has a little difficulty with speech but his swallowing is improved to baseline. His mom had to move in with him and now is his main caregiver.     Limitations  Sitting;Lifting;Standing;Walking;House hold activities    How long can you sit comfortably?  5 minutes    How long can you stand  comfortably?  5 minutes    How long can you walk comfortably?  10 ft    Patient Stated Goals  Walk without walker, walk further with walker, get some strength in back     Currently in Pain?  No/denies       Nustep LE only lvl  3 3 minutes; cues for keeping SPM>60 for increased cardiovascular support    Ambulate outside with RW and CGA. Cues for increasing hip flexion for foot clearance across unstable pathway to avoid obstacles. Ambulate turning L and R around flower beds and grass x 185 ft with 1 seated rest break. Verbal cues required for chest positioning to cue for increased upright posture for improved BOS and step length.   Sit to stand from [facility] with RW and CGA x 5 with good forwards trunk mobility and pressing through legs.   Seated LAQ x 10 each leg with 3 second holds   Seated abduction GTB, one leg at a time. 10x.    Seated GTB marches : cues for upright posture 10x  Pt. response to medical necessity:  Patient will continue to benefit from skilled physical therapy to improve strength balance and gait safety.  PT Education - 07/14/17 1435    Education provided  Yes    Education Details  exercise technique     Person(s) Educated  Patient    Methods  Explanation;Demonstration;Verbal cues    Comprehension  Verbalized understanding;Returned demonstration       PT Short Term Goals - 05/26/17 1514      PT SHORT TERM GOAL #1   Title  Patient will be independent in home exercise program to improve strength/mobility for better functional independence with ADLs.    Baseline  hep compliant    Time  2    Period  Weeks    Status  Achieved      PT SHORT TERM GOAL #2   Title  Patient will require min cueing for STS transfer with CGA for increased independence with mobility.     Baseline  CGA    Time  2    Period  Weeks    Status  Achieved      PT SHORT TERM GOAL #3   Title  Patient will maintain upright posture for > 15 seconds to  demonstrate strengthened postural control muscualture     Baseline  18 seconds     Time  2    Period  Weeks    Status  Achieved        PT Long Term Goals - 05/26/17 1518      PT LONG TERM GOAL #1   Title  Patient will perform bed mobility with Supervision to increase independent mobility    Baseline  Requires Min A for LE for Sit to Supine, independent with bridging to scoot within bed to L and R    Time  8    Period  Weeks    Status  Partially Met      PT LONG TERM GOAL #2   Title  Patient (< 35 years old) will complete five times sit to stand test in < 10 seconds indicating an increased LE strength and improved balance.    Baseline  2/18: 30 seconds from plinth chair height single UE on seat and single on walker ; 32 seconds from plinth with single UE on seat and single on walker.     Time  8    Period  Weeks    Status  Partially Met      PT LONG TERM GOAL #3   Title  Patient will ambulate 40 ft with walker with no breaks to allow for increased mobility within home.     Baseline  12/26: 29 ft with walker and WC follow with no rest breaks; 2/18: multiple trials previous session of >100 ft     Time  8    Period  Weeks    Status  Achieved      PT LONG TERM GOAL #4   Title  Patient will increase BLE gross strength to 4+/5 as to improve functional strength for independent gait, increased standing tolerance and increased ADL ability.    Baseline  2/18: 4-/5 RLE,     Time  8    Period  Weeks    Status  Partially Met      PT LONG TERM GOAL #5   Title  Patient will increase lower extremity functional scale to >40/80 to demonstrate improved functional mobility and increased tolerance with ADLs.     Baseline  14/80; 12/26: 21/80; 2/18: 28/80     Time  8    Period  Weeks  Status  Partially Met      Additional Long Term Goals   Additional Long Term Goals  Yes      PT LONG TERM GOAL #6   Title  Patient will walk through a door involving one small step up/down to increase  functional independence.     Baseline  require Min A and BUE support in // bars at this time    Time  8    Period  Weeks    Status  Partially Met      PT LONG TERM GOAL #7   Title  Patient will ambulate 40 ft with least assistive device and Supervision with no breaks to allow for increased mobility within home.     Baseline  requires use of walker and CGA    Time  8    Period  Weeks    Status  New            Plan - 07/14/17 1506    Clinical Impression Statement  Patient progressing with functional ambulation with improved hip flexion resulting in better LE clearance initially. LE clearance reduced with fatigue with increased trunk flexion noted.  Patient will continue to benefit from skilled physical therapy to improve strength balance and gait safety.    Rehab Potential  Fair    Clinical Impairments Affecting Rehab Potential  hx of HTN, HLD, CVA, learning disability, Diabetes, brain tumor,     PT Frequency  2x / week    PT Duration  8 weeks    PT Treatment/Interventions  ADLs/Self Care Home Management;Aquatic Therapy;Ultrasound;Moist Heat;Traction;DME Instruction;Gait training;Stair training;Functional mobility training;Therapeutic activities;Therapeutic exercise;Orthotic Fit/Training;Neuromuscular re-education;Balance training;Patient/family education;Manual techniques;Wheelchair mobility training;Passive range of motion;Energy conservation;Taping;Visual/perceptual remediation/compensation    PT Next Visit Plan  bed mobility, standing strengthening interventions    PT Home Exercise Plan  see sheet    Consulted and Agree with Plan of Care  Patient;Family member/caregiver       Patient will benefit from skilled therapeutic intervention in order to improve the following deficits and impairments:  Abnormal gait, Decreased activity tolerance, Decreased balance, Decreased knowledge of precautions, Decreased endurance, Decreased coordination, Decreased knowledge of use of DME, Decreased  mobility, Decreased range of motion, Difficulty walking, Decreased safety awareness, Decreased strength, Impaired flexibility, Impaired perceived functional ability, Impaired tone, Postural dysfunction, Improper body mechanics, Pain  Visit Diagnosis: Muscle weakness (generalized)  Other lack of coordination  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 Acute CVA (cerebrovascular accident) ([place]) 05/02/2016  . Left-sided weakness 05/01/2016   Janna Arch, PT, DPT   Janna Arch 07/14/2017, 3:15 PM  [place] [facility] MAIN [facility] SERVICES [address] [place], [place], [address] Phone: [phone]   Fax:  [phone]  Name: Curtis Powell MRN: [id number] Date of Birth: [birth year]

## 2017-07-15 ENCOUNTER — Encounter: Payer: Self-pay | Admitting: Occupational Therapy

## 2017-07-15 NOTE — Therapy (Signed)
[place] MAIN [facility] SERVICES [address] [place], [place], [address] Phone: [phone]   Fax:  [phone]  Occupational Therapy Treatment  Patient Details  Name: Curtis Powell MRN: [id number] Date of Birth: Nov 23, 1965 Referring Provider: Dr. Larinda Buttery   Encounter Date: 07/14/2017  OT End of Session - 07/15/17 1651    Visit Number  25    Number of Visits  36    Date for OT Re-Evaluation  07/30/17    OT Start Time  1346    OT Stop Time  1430    OT Time Calculation (min)  44 min    Activity Tolerance  Patient tolerated treatment well    Behavior During Therapy  [facility] for tasks assessed/performed       Past Medical History:  Diagnosis Date  . GERD (gastroesophageal reflux disease)   . Hyperlipidemia   . Hypertension   . Obesity   . Stroke [facility])     Past Surgical History:  Procedure Laterality Date  . BRAIN SURGERY      There were no vitals filed for this visit.  Subjective Assessment - 07/15/17 1645    Subjective   Patient reports his right shoulder is sore and has some pain with internal rotation.  He would like to work on shoulder motion today.    Pertinent History  Pt. is a 52 y.o. male who suffered a CVA on 05/01/2016. Pt. was admitted to the hospital. Once discharged, he received Home Health PT and OT services for about a month. Pt. has had multiple CVAs over the past 8 years, and has had multiple falls in the past 6 months. Pt. resides in an apartment. Pt. has caregivers for 80 hours. Pt.'s mother stays with pt. at night, and assists with IADL tasks.      Patient Stated Goals  To be able to throw a ball and dribble a ball.    Currently in Pain?  Yes    Pain Score  5     Pain Location  Shoulder    Pain Orientation  Right    Pain Descriptors / Indicators  Aching    Pain Type  Chronic pain    Pain Onset  More than a month ago    Pain Frequency  Intermittent    Multiple Pain Sites  No                    OT Treatments/Exercises (OP) - 07/15/17 1647      Neurological Re-education Exercises   Other Exercises 1  1# dowel in vertical position on table and "climbing" with his hands to reach as far as he can, able to make it almost to the top with right hand.  Multiple repetitions and sets completed. 1# dowel for chest press, ABD/ADD, forwards circle, backwards circle, for 10 repetitions for one set each.   Added on handed chest press with left arm, 2 handed press bilaterally, Resistive pinch pinch to place onto yardstick 10 in above table height of 30 inches. Patient seen for reaching to bucket placed onto right side with cues for elbow extension to obtain pins to place.  Patient seen for multi directional reaching for resistive velcro squares to remove and place onto board.      Other Exercises 2  Manipulation of round pegs to pick up and turn to place into board, moderate difficulty with turning and often requires therapist handing object to patient with cues for prehension  patterns to grasp.              OT Education - 07/15/17 1651    Education provided  Yes    Education Details  exercises for reaching    Person(s) Educated  Patient    Methods  Explanation;Demonstration;Verbal cues    Comprehension  Verbal cues required;Returned demonstration;Verbalized understanding       OT Short Term Goals - 03/03/17 1459      OT SHORT TERM GOAL #1   Title  `        OT Long Term Goals - 07/12/17 1440      OT LONG TERM GOAL #1   Title  Pt. will UE functioning to be able to perfrom toilet hygiene with minimal assistance.    Baseline  Pt. is unable. 05/07/2017: Pt. continues to occassionally have difficulty.    Time  12    Period  Weeks    Status  On-going      OT LONG TERM GOAL #2   Title  Pt. will complete self grooming with minA    Baseline  Pt. requires extensive assist. Pt. is improving with oral care with set-up, however requires extensive assist for  self-grooming tasls.    Time  12    Period  Weeks    Status  On-going      OT LONG TERM GOAL #3   Title  Pt. will perfrom self-feeding skills with modified indepndence    Baseline  Pt. has difficutly, and performs with increased spillage 05/07/2017: Pt. has difficulty with occassionally spilling.    Time  12    Period  Weeks    Status  On-going      OT LONG TERM GOAL #4   Title  Pt. will perform self dressing with minA and A/E as needed.    Baseline  Pt. requires extensive assist: Pt. conitnues to have difficulty perfroming UE,a nd LE dressing.    Time  12    Period  Weeks    Status  On-going      OT LONG TERM GOAL #5   Title  Pt. will perfrom light home making tasks with minA    Baseline  Pt. requires extensive assistance 05/07/2017: Pt. continues to have difficulty folding clothes. Pt. is improving with making a sandwich.    Time  12    Period  Weeks    Status  On-going      OT LONG TERM GOAL #6   Title  Pt. will write his name efficiently with 100% legibility    Baseline  50% legibility with incresed time required 04/27/2017: Pt. continues to present with difficulty writing legibly.     Time  12    Period  Weeks    Status  On-going            Plan - 07/15/17 1652    Clinical Impression Statement  Patient continues to complain of pain in right shoulder at times and appears to be more painful with any movements towards internal rotation, this is limiting his ability with his right arm for tasks such as donning jacket and toileting.  Patient continues to work towards improving range of motion, strengthening and manipulation of objects to improve performance in daily tasks and increase independence and reliance on caregiver.     Occupational performance deficits (Please refer to evaluation for details):  ADL's;IADL's    Rehab Potential  Fair    OT Frequency  2x / week  OT Duration  12 weeks    OT Treatment/Interventions  Self-care/ADL training;Patient/family education;DME  and/or AE instruction;Therapeutic exercise;Moist Heat;Neuromuscular education    Consulted and Agree with Plan of Care  Patient       Patient will benefit from skilled therapeutic intervention in order to improve the following deficits and impairments:  Pain, Decreased cognition, Decreased activity tolerance, Impaired UE functional use, Decreased range of motion, Decreased coordination, Decreased endurance  Visit Diagnosis: Muscle weakness (generalized)  Other lack of coordination  Hemiplegia and hemiparesis following cerebral infarction affecting right dominant side [facility])    Problem List Patient Active Problem List   Diagnosis Date Noted  . Acute CVA (cerebrovascular accident) ([place]) 05/02/2016  . Left-sided weakness 05/01/2016   Vanetta Rule T [name], OTR/L, CLT  Tijana Walder 07/15/2017, 4:55 PM  [place] MAIN [facility] SERVICES [address] [place], [place], [address] Phone: [phone]   Fax:  [phone]  Name: NORI POLAND MRN: [id number] Date of Birth: 03-05-1966

## 2017-07-16 ENCOUNTER — Ambulatory Visit: Payer: Medicare HMO | Admitting: Occupational Therapy

## 2017-07-16 ENCOUNTER — Ambulatory Visit: Payer: Medicare HMO

## 2017-07-16 ENCOUNTER — Encounter: Payer: Self-pay | Admitting: Occupational Therapy

## 2017-07-16 DIAGNOSIS — R278 Other lack of coordination: Secondary | ICD-10-CM

## 2017-07-16 DIAGNOSIS — M6281 Muscle weakness (generalized): Secondary | ICD-10-CM

## 2017-07-16 DIAGNOSIS — R2689 Other abnormalities of gait and mobility: Secondary | ICD-10-CM

## 2017-07-16 DIAGNOSIS — I69351 Hemiplegia and hemiparesis following cerebral infarction affecting right dominant side: Secondary | ICD-10-CM

## 2017-07-16 NOTE — Therapy (Signed)
[place] MAIN [facility] SERVICES [address] [place], [place], [address] Phone: [phone]   Fax:  [phone]  Physical Therapy Treatment  Patient Details  Name: Curtis Powell MRN: [id number] Date of Birth: 04-Jun-1965 Referring Provider: Dr. Larinda Buttery   Encounter Date: 07/16/2017  PT End of Session - 07/16/17 1527    Visit Number  34    Number of Visits  48    Date for PT Re-Evaluation  09/10/17    PT Start Time  2633    PT Stop Time  3545    PT Time Calculation (min)  43 min    Equipment Utilized During Treatment  Gait belt    Activity Tolerance  Patient tolerated treatment well;No increased pain    Behavior During Therapy  WFL for tasks assessed/performed       Past Medical History:  Diagnosis Date  . GERD (gastroesophageal reflux disease)   . Hyperlipidemia   . Hypertension   . Obesity   . Stroke [facility])     Past Surgical History:  Procedure Laterality Date  . BRAIN SURGERY      There were no vitals filed for this visit.  Subjective Assessment - 07/16/17 1523    Subjective  Patient reports no stumbles or falls since last session. Compliant with HEP.     Pertinent History   Patient is a pleasant 52 year old male who presents to physical therapy for weakness and immobility secondary to CVA.  Had a stroke in Feb 2018. He was previously fully independent, but this stroke caused severe residual deficits, mainly on the right side as well as speech, and now he is unable to walk or perform most of his ADLs on his own. Entire right side is very weak. He still has a little difficulty with speech but his swallowing is improved to baseline. His mom had to move in with him and now is his main caregiver.     Limitations  Sitting;Lifting;Standing;Walking;House hold activities    How long can you sit comfortably?  5 minutes    How long can you stand comfortably?  5 minutes    How long can you walk comfortably?  10 ft    Patient  Stated Goals  Walk without walker, walk further with walker, get some strength in back     Currently in Pain?  No/denies      LEFS : 25/80 5xSTS: 26 seconds 10MWT: 2 min 45 seconds    Walk through door with one small step: Require CGA: utilize BUE:   4" step up : step up , cues for up with the good down with the bad, hand placement and foot positioning.   4" step toe taps 12 x each leg, BUE support   Airex pad balance: one hand flat 3 minutes with tactile cueing                        PT Education - 07/16/17 1527    Education provided  Yes    Education Details  exercise technique, POC, step negotiation     Person(s) Educated  Patient    Methods  Explanation;Demonstration;Verbal cues    Comprehension  Verbalized understanding;Returned demonstration       PT Short Term Goals - 07/16/17 1531      PT SHORT TERM GOAL #1   Title  Patient will be independent in home exercise program to improve strength/mobility for better functional independence  with ADLs.    Baseline  hep compliant    Time  2    Period  Weeks    Status  Achieved      PT SHORT TERM GOAL #2   Title  Patient will require min cueing for STS transfer with CGA for increased independence with mobility.     Baseline  CGA    Time  2    Period  Weeks    Status  Achieved      PT SHORT TERM GOAL #3   Title  Patient will maintain upright posture for > 15 seconds to demonstrate strengthened postural control muscualture     Baseline  18 seconds     Time  2    Period  Weeks    Status  Achieved      PT SHORT TERM GOAL #4   Title  Patient will perform         PT Long Term Goals - 07/16/17 1434      PT LONG TERM GOAL #1   Title  Patient will perform bed mobility with Supervision to increase independent mobility    Baseline  able to perform mod I for increased time , occasional Min A for scooting up in bed    Time  8    Period  Weeks    Status  Partially Met      PT LONG TERM GOAL #2   Title   Patient (< 87 years old) will complete five times sit to stand test in < 10 seconds indicating an increased LE strength and improved balance.    Baseline  2/18: 30 seconds from plinth chair height single UE on seat and single on walker ; 32 seconds from plinth with single UE on seat and single on walker. 4/10: 26 seconds from [place] with one hand on walker    Time  8    Period  Weeks    Status  Partially Met      PT LONG TERM GOAL #3   Title  Patient will ambulate 40 ft with walker with no breaks to allow for increased mobility within home.     Baseline  12/26: 29 ft with walker and WC follow with no rest breaks; 2/18: multiple trials previous session of >100 ft     Time  8    Period  Weeks    Status  Achieved      PT LONG TERM GOAL #4   Title  Patient will increase BLE gross strength to 4+/5 as to improve functional strength for independent gait, increased standing tolerance and increased ADL ability.    Baseline   4-/5 RLE,     Time  8    Period  Weeks    Status  Partially Met    Target Date  09/10/17      PT LONG TERM GOAL #5   Title  Patient will increase lower extremity functional scale to >40/80 to demonstrate improved functional mobility and increased tolerance with ADLs.     Baseline  14/80; 12/26: 21/80; 2/18: 28/80  4/10; 25/80    Time  8    Period  Weeks    Status  Partially Met    Target Date  09/10/17      Additional Long Term Goals   Additional Long Term Goals  Yes      PT LONG TERM GOAL #6   Title  Patient will walk through a door involving one small step  up/down to increase functional independence.     Baseline  require Min A and BUE support in // bars at this time    Time  8    Period  Weeks    Status  Partially Met      PT LONG TERM GOAL #7   Title  Patient will ambulate 40 ft with least assistive device and Supervision with no breaks to allow for increased mobility within home.     Baseline  requires use of walker and CGA    Time  8    Period  Weeks     Status  Partially Met    Target Date  09/10/17      PT LONG TERM GOAL #8   Title  Patient will perform 10 MWT in < 1 minute to demonstrate improved ambulatory velocity and capacity.     Baseline  4/10: 2 minutes 45 seconds    Time  8    Period  Weeks    Status  New    Target Date  09/10/17            Plan - 07/16/17 1530    Clinical Impression Statement  Patient progressing towards goals at this time with improved ambulatory capacity, transfers, and ambulation speed. Patient requires UE support for ambulation and step negotiation at this time due to limited capacity of LE's and flexed trunk posture. Patient would continue to benefit from skilled physical therapy to improve strength, balance, and safety.     Rehab Potential  Fair    Clinical Impairments Affecting Rehab Potential  hx of HTN, HLD, CVA, learning disability, Diabetes, brain tumor,     PT Frequency  2x / week    PT Duration  8 weeks    PT Treatment/Interventions  ADLs/Self Care Home Management;Aquatic Therapy;Ultrasound;Moist Heat;Traction;DME Instruction;Gait training;Stair training;Functional mobility training;Therapeutic activities;Therapeutic exercise;Orthotic Fit/Training;Neuromuscular re-education;Balance training;Patient/family education;Manual techniques;Wheelchair mobility training;Passive range of motion;Energy conservation;Taping;Visual/perceptual remediation/compensation    PT Next Visit Plan  floor recovery     PT Home Exercise Plan  see sheet    Consulted and Agree with Plan of Care  Patient;Family member/caregiver       Patient will benefit from skilled therapeutic intervention in order to improve the following deficits and impairments:  Abnormal gait, Decreased activity tolerance, Decreased balance, Decreased knowledge of precautions, Decreased endurance, Decreased coordination, Decreased knowledge of use of DME, Decreased mobility, Decreased range of motion, Difficulty walking, Decreased safety awareness,  Decreased strength, Impaired flexibility, Impaired perceived functional ability, Impaired tone, Postural dysfunction, Improper body mechanics, Pain  Visit Diagnosis: Muscle weakness (generalized)  Other lack of coordination  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 Acute CVA (cerebrovascular accident) ([place]) 05/02/2016  . Left-sided weakness 05/01/2016  Janna Arch, PT, DPT    07/16/2017, 5:28 PM  [place] MAIN [facility] SERVICES [address] [place], [place], [address] Phone: [phone]   Fax:  [phone]  Name: Curtis Powell MRN: [id number] Date of Birth: 05-13-1965

## 2017-07-16 NOTE — Therapy (Signed)
[place] MAIN [facility] SERVICES [address] [place], [place], [address] Phone: [phone]   Fax:  [phone]  Occupational Therapy Treatment  Patient Details  Name: Curtis Powell MRN: [id number] Date of Birth: 1965/07/22 Referring Provider: Dr. Larinda Buttery   Encounter Date: 07/16/2017  OT End of Session - 07/16/17 1355    Visit Number  25    Number of Visits  36    Date for OT Re-Evaluation  07/30/17    OT Start Time  1351    OT Stop Time  1430    OT Time Calculation (min)  39 min    Activity Tolerance  Patient tolerated treatment well    Behavior During Therapy  [facility] for tasks assessed/performed       Past Medical History:  Diagnosis Date  . GERD (gastroesophageal reflux disease)   . Hyperlipidemia   . Hypertension   . Obesity   . Stroke [facility])     Past Surgical History:  Procedure Laterality Date  . BRAIN SURGERY      There were no vitals filed for this visit.  Subjective Assessment - 07/16/17 1353    Subjective   Pt. reports his right shoulder pain is better today.    Patient is accompained by:  Family member    Pertinent History  Pt. is a 52 y.o. male who suffered a CVA on 05/01/2016. Pt. was admitted to the hospital. Once discharged, he received Home Health PT and OT services for about a month. Pt. has had multiple CVAs over the past 8 years, and has had multiple falls in the past 6 months. Pt. resides in an apartment. Pt. has caregivers for 80 hours. Pt.'s mother stays with pt. at night, and assists with IADL tasks.      Patient Stated Goals  To be able to throw a ball and dribble a ball.    Currently in Pain?  No/denies       OT TREATMENT    Therapeutic Exercise:  Pt. tolerated PROM, and AROM for right shoulder flexion, abduction,and horizontal abduction. Pt. Responded well to the slow gentle ROM, and stretching with decreased pain following ROM. Pt. worked on [place] there. Ex. with red theraband for left  shoulder flexion, abduction, diagonal abduction, bilateral elbow flexion, and extension.  Selfcare:  Pt. Worked on LE dressing tasks using A/E with mod A for the RLE. Pt. worked on using a Secondary school teacher, sockaide, and [employer] shoehorn. Pt. Required visual demonstration, visual cues, and assist.                  OT Treatments/Exercises (OP) - 07/15/17 1647      Neurological Re-education Exercises   Other Exercises 1  1# dowel in vertical position on table and "climbing" with his hands to reach as far as he can, able to make it almost to the top with right hand.  Multiple repetitions and sets completed. 1# dowel for chest press, ABD/ADD, forwards circle, backwards circle, for 10 repetitions for one set each.   Added on handed chest press with left arm, 2 handed press bilaterally, Resistive pinch pinch to place onto yardstick 10 in above table height of 30 inches. Patient seen for reaching to bucket placed onto right side with cues for elbow extension to obtain pins to place.  Patient seen for multi directional reaching for resistive velcro squares to remove and place onto board.      Other Exercises 2  Manipulation of round  pegs to pick up and turn to place into board, moderate difficulty with turning and often requires therapist handing object to patient with cues for prehension patterns to grasp.              OT Education - 07/16/17 1354    Education provided  Yes    Education Details  UE functioning.    Person(s) Educated  Patient    Methods  Explanation;Demonstration;Verbal cues    Comprehension  Verbalized understanding;Returned demonstration;Verbal cues required       OT Short Term Goals - 03/03/17 1459      OT SHORT TERM GOAL #1   Title  `        OT Long Term Goals - 07/12/17 1440      OT LONG TERM GOAL #1   Title  Pt. will UE functioning to be able to perfrom toilet hygiene with minimal assistance.    Baseline  Pt. is unable. 05/07/2017: Pt. continues to  occassionally have difficulty.    Time  12    Period  Weeks    Status  On-going      OT LONG TERM GOAL #2   Title  Pt. will complete self grooming with minA    Baseline  Pt. requires extensive assist. Pt. is improving with oral care with set-up, however requires extensive assist for self-grooming tasls.    Time  12    Period  Weeks    Status  On-going      OT LONG TERM GOAL #3   Title  Pt. will perfrom self-feeding skills with modified indepndence    Baseline  Pt. has difficutly, and performs with increased spillage 05/07/2017: Pt. has difficulty with occassionally spilling.    Time  12    Period  Weeks    Status  On-going      OT LONG TERM GOAL #4   Title  Pt. will perform self dressing with minA and A/E as needed.    Baseline  Pt. requires extensive assist: Pt. conitnues to have difficulty perfroming UE,a nd LE dressing.    Time  12    Period  Weeks    Status  On-going      OT LONG TERM GOAL #5   Title  Pt. will perfrom light home making tasks with minA    Baseline  Pt. requires extensive assistance 05/07/2017: Pt. continues to have difficulty folding clothes. Pt. is improving with making a sandwich.    Time  12    Period  Weeks    Status  On-going      OT LONG TERM GOAL #6   Title  Pt. will write his name efficiently with 100% legibility    Baseline  50% legibility with incresed time required 04/27/2017: Pt. continues to present with difficulty writing legibly.     Time  12    Period  Weeks    Status  On-going            Plan - 07/16/17 1355    Clinical Impression Statement  Pt.'s has no complaints of right shoulder pain today. Pt. was able to tolerate ROM to the right shoulder. Pt. was able to tolerate UE theraband exercises without difficulty. Right shoulder was excluded from the resistive exercises secondary to a history of pain. Pt. continues to work on improving UE functioning for improved ADLs, and IADL functioning. pt. worked on improving LE dressing using A/E.     Occupational performance deficits (Please refer to evaluation  for details):  ADL's;IADL's    Rehab Potential  Fair    OT Frequency  2x / week    OT Duration  12 weeks    OT Treatment/Interventions  Self-care/ADL training;Patient/family education;DME and/or AE instruction;Therapeutic exercise;Moist Heat;Neuromuscular education    Clinical Decision Making  Several treatment options, min-mod task modification necessary    Consulted and Agree with Plan of Care  Patient       Patient will benefit from skilled therapeutic intervention in order to improve the following deficits and impairments:  Pain, Decreased cognition, Decreased activity tolerance, Impaired UE functional use, Decreased range of motion, Decreased coordination, Decreased endurance  Visit Diagnosis: Muscle weakness (generalized)    Problem List Patient Active Problem List   Diagnosis Date Noted  . Acute CVA (cerebrovascular accident) ([place]) 05/02/2016  . Left-sided weakness 05/01/2016    Harrel Carina, MS, OTR/L 07/16/2017, 3:19 PM  [place] MAIN [facility] SERVICES [address] [place], [place], [address] Phone: [phone]   Fax:  [phone]  Name: Curtis Powell MRN: [id number] Date of Birth: Dec 30, 1965

## 2017-07-21 ENCOUNTER — Ambulatory Visit: Payer: Medicare HMO

## 2017-07-21 ENCOUNTER — Ambulatory Visit: Payer: Medicare HMO | Admitting: Occupational Therapy

## 2017-07-21 ENCOUNTER — Encounter: Payer: Self-pay | Admitting: Occupational Therapy

## 2017-07-21 DIAGNOSIS — M6281 Muscle weakness (generalized): Secondary | ICD-10-CM | POA: Diagnosis not present

## 2017-07-21 DIAGNOSIS — R278 Other lack of coordination: Secondary | ICD-10-CM

## 2017-07-21 DIAGNOSIS — I69351 Hemiplegia and hemiparesis following cerebral infarction affecting right dominant side: Secondary | ICD-10-CM

## 2017-07-21 DIAGNOSIS — R2689 Other abnormalities of gait and mobility: Secondary | ICD-10-CM

## 2017-07-21 NOTE — Therapy (Signed)
[place] MAIN [facility] SERVICES [address] [place], [place], [address] Phone: [phone]   Fax:  [phone]  Physical Therapy Treatment  Patient Details  Name: Curtis Powell MRN: [id number] Date of Birth: [birth year] Referring Provider: Dr. Larinda Buttery   Encounter Date: 07/21/2017  PT End of Session - 07/21/17 1439    Visit Number  35    Number of Visits  48    Date for PT Re-Evaluation  09/10/17    Authorization Type  2/10 progress note    PT Start Time  1431    PT Stop Time  1515    PT Time Calculation (min)  44 min    Equipment Utilized During Treatment  Gait belt    Activity Tolerance  Patient tolerated treatment well;No increased pain    Behavior During Therapy  WFL for tasks assessed/performed       Past Medical History:  Diagnosis Date  . GERD (gastroesophageal reflux disease)   . Hyperlipidemia   . Hypertension   . Obesity   . Stroke [facility])     Past Surgical History:  Procedure Laterality Date  . BRAIN SURGERY      There were no vitals filed for this visit.  Subjective Assessment - 07/21/17 1437    Subjective  Patient reports R shoulder pain that began this morning after reaching back behind chair. Compliant with HEP, no complaints/concerns.     Pertinent History   Patient is a pleasant 52 year old male who presents to physical therapy for weakness and immobility secondary to CVA.  Had a stroke in Feb 2018. He was previously fully independent, but this stroke caused severe residual deficits, mainly on the right side as well as speech, and now he is unable to walk or perform most of his ADLs on his own. Entire right side is very weak. He still has a little difficulty with speech but his swallowing is improved to baseline. His mom had to move in with him and now is his main caregiver.     Limitations  Sitting;Lifting;Standing;Walking;House hold activities    How long can you sit comfortably?  5 minutes    How  long can you stand comfortably?  5 minutes    How long can you walk comfortably?  10 ft    Patient Stated Goals  Walk without walker, walk further with walker, get some strength in back     Currently in Pain?  Yes    Pain Score  6     Pain Location  Shoulder    Pain Orientation  Right    Pain Descriptors / Indicators  Aching    Pain Type  Chronic pain    Pain Onset  More than a month ago    Pain Frequency  Intermittent      Nustep Lvl 2 LE only due to UE pain, cues for RPM>60 for cardiovascular strain  Ambulate 260 ft outside with RW and CGA with patient negotiating curvature of sidewalk and unstable cement, stone, and paved surfaces with no LOB or tripping when negotiation transition from one surface to another, verbal cueing for upright posture and height of hip/knee flexion. One seated rest break due to fatigue required. RLE fatigued quicker than LLE. ; performed to replicate natural environment of patient outside.   STS transfer x5 with cues for upright posture and gluteal squeezes.       Physical Therapy Progress Note   Dates of reporting period  07/16/17   to   08/25/17  Patient's goals assessed last session. Patient progressing towards goals at this time with improved ambulatory capacity, transfers, and ambulation speed. Patient requires UE support for ambulation and step negotiation at this time due to limited capacity of LE's and flexed trunk posture. Patient would continue to benefit from skilled physical therapy to improve strength, balance, and safety.                          PT Education - 07/21/17 1438    Education provided  Yes    Education Details  exercise technique, body mechanics     Person(s) Educated  Patient    Methods  Explanation;Demonstration;Verbal cues    Comprehension  Verbalized understanding;Returned demonstration       PT Short Term Goals - 07/16/17 1531      PT SHORT TERM GOAL #1   Title  Patient will be independent in home  exercise program to improve strength/mobility for better functional independence with ADLs.    Baseline  hep compliant    Time  2    Period  Weeks    Status  Achieved      PT SHORT TERM GOAL #2   Title  Patient will require min cueing for STS transfer with CGA for increased independence with mobility.     Baseline  CGA    Time  2    Period  Weeks    Status  Achieved      PT SHORT TERM GOAL #3   Title  Patient will maintain upright posture for > 15 seconds to demonstrate strengthened postural control muscualture     Baseline  18 seconds     Time  2    Period  Weeks    Status  Achieved      PT SHORT TERM GOAL #4   Title  Patient will perform         PT Long Term Goals - 07/16/17 1434      PT LONG TERM GOAL #1   Title  Patient will perform bed mobility with Supervision to increase independent mobility    Baseline  able to perform mod I for increased time , occasional Min A for scooting up in bed    Time  8    Period  Weeks    Status  Partially Met      PT LONG TERM GOAL #2   Title  Patient (< 22 years old) will complete five times sit to stand test in < 10 seconds indicating an increased LE strength and improved balance.    Baseline  2/18: 30 seconds from plinth chair height single UE on seat and single on walker ; 32 seconds from plinth with single UE on seat and single on walker. 4/10: 26 seconds from [place] with one hand on walker    Time  8    Period  Weeks    Status  Partially Met      PT LONG TERM GOAL #3   Title  Patient will ambulate 40 ft with walker with no breaks to allow for increased mobility within home.     Baseline  12/26: 29 ft with walker and WC follow with no rest breaks; 2/18: multiple trials previous session of >100 ft     Time  8    Period  Weeks    Status  Achieved      PT LONG TERM GOAL #  4   Title  Patient will increase BLE gross strength to 4+/5 as to improve functional strength for independent gait, increased standing tolerance and increased ADL  ability.    Baseline   4-/5 RLE,     Time  8    Period  Weeks    Status  Partially Met    Target Date  09/10/17      PT LONG TERM GOAL #5   Title  Patient will increase lower extremity functional scale to >40/80 to demonstrate improved functional mobility and increased tolerance with ADLs.     Baseline  14/80; 12/26: 21/80; 2/18: 28/80  4/10; 25/80    Time  8    Period  Weeks    Status  Partially Met    Target Date  09/10/17      Additional Long Term Goals   Additional Long Term Goals  Yes      PT LONG TERM GOAL #6   Title  Patient will walk through a door involving one small step up/down to increase functional independence.     Baseline  require Min A and BUE support in // bars at this time    Time  8    Period  Weeks    Status  Partially Met      PT LONG TERM GOAL #7   Title  Patient will ambulate 40 ft with least assistive device and Supervision with no breaks to allow for increased mobility within home.     Baseline  requires use of walker and CGA    Time  8    Period  Weeks    Status  Partially Met    Target Date  09/10/17      PT LONG TERM GOAL #8   Title  Patient will perform 10 MWT in < 1 minute to demonstrate improved ambulatory velocity and capacity.     Baseline  4/10: 2 minutes 45 seconds    Time  8    Period  Weeks    Status  New    Target Date  09/10/17            Plan - 07/21/17 1512    Clinical Impression Statement  Patient ambulated outside on unstable surfaces that required increased hip flexion and stability without LOB demonstrating improved stability and balance with ambulation. Right lower extremity fatigued quicker than LLE. Patient will continue to benefit from skilled physical therapy to improve strength, balance, and safety.     Rehab Potential  Fair    Clinical Impairments Affecting Rehab Potential  hx of HTN, HLD, CVA, learning disability, Diabetes, brain tumor,     PT Frequency  2x / week    PT Duration  8 weeks    PT  Treatment/Interventions  ADLs/Self Care Home Management;Aquatic Therapy;Ultrasound;Moist Heat;Traction;DME Instruction;Gait training;Stair training;Functional mobility training;Therapeutic activities;Therapeutic exercise;Orthotic Fit/Training;Neuromuscular re-education;Balance training;Patient/family education;Manual techniques;Wheelchair mobility training;Passive range of motion;Energy conservation;Taping;Visual/perceptual remediation/compensation    PT Next Visit Plan  floor recovery     PT Home Exercise Plan  see sheet    Consulted and Agree with Plan of Care  Patient;Family member/caregiver       Patient will benefit from skilled therapeutic intervention in order to improve the following deficits and impairments:  Abnormal gait, Decreased activity tolerance, Decreased balance, Decreased knowledge of precautions, Decreased endurance, Decreased coordination, Decreased knowledge of use of DME, Decreased mobility, Decreased range of motion, Difficulty walking, Decreased safety awareness, Decreased strength, Impaired flexibility, Impaired perceived functional ability, Impaired  tone, Postural dysfunction, Improper body mechanics, Pain  Visit Diagnosis: Muscle weakness (generalized)  Other lack of coordination  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 Acute CVA (cerebrovascular accident) ([place]) 05/02/2016  . Left-sided weakness 05/01/2016   Janna Arch, PT, DPT   07/21/2017, 4:04 PM  [place] MAIN [facility] SERVICES [address] [place], [place], [address] Phone: [phone]   Fax:  [phone]  Name: Curtis Powell MRN: [id number] Date of Birth: 09-15-1965

## 2017-07-21 NOTE — Therapy (Signed)
[place] MAIN [facility] SERVICES [address] [place], [place], [address] Phone: [phone]   Fax:  [phone]  Occupational Therapy Treatment  Patient Details  Name: Curtis Powell MRN: [id number] Date of Birth: 02/04/1966 Referring Provider: Dr. Larinda Buttery   Encounter Date: 07/21/2017  OT End of Session - 07/21/17 1354    Visit Number  26    Number of Visits  36    Date for OT Re-Evaluation  07/30/17    Authorization Type  Vist 9 of 10 for progress reporting period starting 05/07/2017    OT Start Time  1345    OT Stop Time  1430    OT Time Calculation (min)  45 min    Activity Tolerance  Patient tolerated treatment well    Behavior During Therapy  [facility] for tasks assessed/performed       Past Medical History:  Diagnosis Date  . GERD (gastroesophageal reflux disease)   . Hyperlipidemia   . Hypertension   . Obesity   . Stroke [facility])     Past Surgical History:  Procedure Laterality Date  . BRAIN SURGERY      There were no vitals filed for this visit.  Subjective Assessment - 07/21/17 1352    Subjective   Pt. reports his right shoulder is feeling better overall.    Patient is accompained by:  Family member    Pertinent History  Pt. is a 52 y.o. male who suffered a CVA on 05/01/2016. Pt. was admitted to the hospital. Once discharged, he received Home Health PT and OT services for about a month. Pt. has had multiple CVAs over the past 8 years, and has had multiple falls in the past 6 months. Pt. resides in an apartment. Pt. has caregivers for 80 hours. Pt.'s mother stays with pt. at night, and assists with IADL tasks.      Currently in Pain?  Yes    Pain Score  5     Pain Location  Shoulder    Pain Orientation  Right      OT TREATMENT    Therapeutic Exercise:  Pt. Tolerated PROM, and slow gentle stretching in the right shoulder for flexion, and abduction. Pt. Responded well to PROM, and gentle stretching. Pt.  Tolerated Bilateral UE strengthening with red theraband for left shoulder flexion, abduction, and bilateral elbow flexion, extension.                           OT Education - 07/21/17 1354    Education provided  Yes    Education Details  UE ther. ex.     Person(s) Educated  Patient    Methods  Explanation;Demonstration;Verbal cues    Comprehension  Verbalized understanding;Returned demonstration       OT Short Term Goals - 03/03/17 1459      OT SHORT TERM GOAL #1   Title  `        OT Long Term Goals - 07/12/17 1440      OT LONG TERM GOAL #1   Title  Pt. will UE functioning to be able to perfrom toilet hygiene with minimal assistance.    Baseline  Pt. is unable. 05/07/2017: Pt. continues to occassionally have difficulty.    Time  12    Period  Weeks    Status  On-going      OT LONG TERM GOAL #2   Title  Pt. will complete self  grooming with minA    Baseline  Pt. requires extensive assist. Pt. is improving with oral care with set-up, however requires extensive assist for self-grooming tasls.    Time  12    Period  Weeks    Status  On-going      OT LONG TERM GOAL #3   Title  Pt. will perfrom self-feeding skills with modified indepndence    Baseline  Pt. has difficutly, and performs with increased spillage 05/07/2017: Pt. has difficulty with occassionally spilling.    Time  12    Period  Weeks    Status  On-going      OT LONG TERM GOAL #4   Title  Pt. will perform self dressing with minA and A/E as needed.    Baseline  Pt. requires extensive assist: Pt. conitnues to have difficulty perfroming UE,a nd LE dressing.    Time  12    Period  Weeks    Status  On-going      OT LONG TERM GOAL #5   Title  Pt. will perform light home making tasks with minA    Baseline  Pt. requires extensive assistance 05/07/2017: Pt. continues to have difficulty folding clothes. Pt. is improving with making a sandwich.    Time  12    Period  Weeks    Status  On-going       OT LONG TERM GOAL #6   Title  Pt. will write his name efficiently with 100% legibility    Baseline  50% legibility with incresed time required 04/27/2017: Pt. continues to present with difficulty writing legibly.     Time  12    Period  Weeks    Status  On-going            Plan - 07/21/17 1359    Clinical Impression Statement  Pt. reports he has been reaching around his chair for his urinal with his right UE at home. Pt. reports he hand stopped using his RUE to push the wheels forward on his chair at home. Pt. reports having right shoulder pain initially, however it improved after ROM. Pt. continues to work on improvng UE strength, and coordination skills for improved ADL, and IADL functioning.     Occupational performance deficits (Please refer to evaluation for details):  ADL's;IADL's    Rehab Potential  Fair    OT Frequency  2x / week    OT Duration  12 weeks    OT Treatment/Interventions  Self-care/ADL training;Patient/family education;DME and/or AE instruction;Therapeutic exercise;Moist Heat;Neuromuscular education    Clinical Decision Making  Several treatment options, min-mod task modification necessary    Consulted and Agree with Plan of Care  Patient       Patient will benefit from skilled therapeutic intervention in order to improve the following deficits and impairments:  Pain, Decreased cognition, Decreased activity tolerance, Impaired UE functional use, Decreased range of motion, Decreased coordination, Decreased endurance  Visit Diagnosis: Muscle weakness (generalized)    Problem List Patient Active Problem List   Diagnosis Date Noted  . Acute CVA (cerebrovascular accident) ([place]) 05/02/2016  . Left-sided weakness 05/01/2016    Luberta Mutter, OTR/L 07/21/2017, 4:14 PM  [place] MAIN [facility] SERVICES [address] [place], [place], [address] Phone: [phone]   Fax:  [phone]  Name: ARTEM BUNTE MRN:  [id number] Date of Birth: 03-Dec-1965

## 2017-07-23 ENCOUNTER — Encounter: Payer: Self-pay | Admitting: Occupational Therapy

## 2017-07-23 ENCOUNTER — Ambulatory Visit: Payer: Medicare HMO

## 2017-07-23 ENCOUNTER — Ambulatory Visit: Payer: Medicare HMO | Admitting: Occupational Therapy

## 2017-07-23 DIAGNOSIS — R278 Other lack of coordination: Secondary | ICD-10-CM

## 2017-07-23 DIAGNOSIS — M6281 Muscle weakness (generalized): Secondary | ICD-10-CM

## 2017-07-23 DIAGNOSIS — R2689 Other abnormalities of gait and mobility: Secondary | ICD-10-CM

## 2017-07-23 DIAGNOSIS — I69351 Hemiplegia and hemiparesis following cerebral infarction affecting right dominant side: Secondary | ICD-10-CM

## 2017-07-23 NOTE — Therapy (Signed)
[place] MAIN [facility] SERVICES [address] [place], [place], [address] Phone: [phone]   Fax:  [phone]  Physical Therapy Treatment  Patient Details  Name: Curtis Powell MRN: [id number] Date of Birth: [birth year] Referring Provider: Dr. Larinda Buttery   Encounter Date: 07/23/2017  PT End of Session - 07/23/17 1505    Visit Number  36    Number of Visits  48    Date for PT Re-Evaluation  09/10/17    Authorization Type  3/10 progress note    PT Start Time  1435    PT Stop Time  1515    PT Time Calculation (min)  40 min    Equipment Utilized During Treatment  Gait belt    Activity Tolerance  Patient tolerated treatment well;No increased pain    Behavior During Therapy  WFL for tasks assessed/performed       Past Medical History:  Diagnosis Date  . GERD (gastroesophageal reflux disease)   . Hyperlipidemia   . Hypertension   . Obesity   . Stroke [facility])     Past Surgical History:  Procedure Laterality Date  . BRAIN SURGERY      There were no vitals filed for this visit.  Subjective Assessment - 07/23/17 1438    Subjective  Patient reports shoulder is feeling better. Patient compliant with HEP. No falls/LOB since last session.     Pertinent History   Patient is a pleasant 52 year old male who presents to physical therapy for weakness and immobility secondary to CVA.  Had a stroke in Feb 2018. He was previously fully independent, but this stroke caused severe residual deficits, mainly on the right side as well as speech, and now he is unable to walk or perform most of his ADLs on his own. Entire right side is very weak. He still has a little difficulty with speech but his swallowing is improved to baseline. His mom had to move in with him and now is his main caregiver.     Limitations  Sitting;Lifting;Standing;Walking;House hold activities    How long can you sit comfortably?  5 minutes    How long can you stand  comfortably?  5 minutes    How long can you walk comfortably?  10 ft    Patient Stated Goals  Walk without walker, walk further with walker, get some strength in back     Currently in Pain?  Yes    Pain Score  1     Pain Location  Shoulder    Pain Orientation  Left    Pain Descriptors / Indicators  Aching    Pain Type  Chronic pain    Pain Onset  More than a month ago    Pain Frequency  Intermittent      Scooting up and down in bed: Min tactile cueing to keep feet from slipping  Attempt rolling L and R onto stomach. Painful to R shoulder.   Sidelying: hip flexor stretch.1x 30 seconds  Bathroom mobility  Sit to stand from raised surface 10x cues for gluteal activation for upright position. Cues for one hand on plinth other hand on walker  Marching with RW and CGA 10x with cues for upright posture. Decreased clearance of LLE. Need Min A for weight shift.   Standing weight shift with RW and CGA: knee bends. Challenged patient and required Min A   Seated marching 10x with cues for upright posture  Seated abduction GTB 10x  with 5 second holds .                        PT Education - 07/23/17 1504    Education provided  Yes    Education Details  exercise technique, weight shift     Person(s) Educated  Patient    Methods  Explanation;Demonstration;Verbal cues    Comprehension  Verbalized understanding;Returned demonstration       PT Short Term Goals - 07/16/17 1531      PT SHORT TERM GOAL #1   Title  Patient will be independent in home exercise program to improve strength/mobility for better functional independence with ADLs.    Baseline  hep compliant    Time  2    Period  Weeks    Status  Achieved      PT SHORT TERM GOAL #2   Title  Patient will require min cueing for STS transfer with CGA for increased independence with mobility.     Baseline  CGA    Time  2    Period  Weeks    Status  Achieved      PT SHORT TERM GOAL #3   Title  Patient will  maintain upright posture for > 15 seconds to demonstrate strengthened postural control muscualture     Baseline  18 seconds     Time  2    Period  Weeks    Status  Achieved      PT SHORT TERM GOAL #4   Title  Patient will perform         PT Long Term Goals - 07/16/17 1434      PT LONG TERM GOAL #1   Title  Patient will perform bed mobility with Supervision to increase independent mobility    Baseline  able to perform mod I for increased time , occasional Min A for scooting up in bed    Time  8    Period  Weeks    Status  Partially Met      PT LONG TERM GOAL #2   Title  Patient (< 34 years old) will complete five times sit to stand test in < 10 seconds indicating an increased LE strength and improved balance.    Baseline  2/18: 30 seconds from plinth chair height single UE on seat and single on walker ; 32 seconds from plinth with single UE on seat and single on walker. 4/10: 26 seconds from [place] with one hand on walker    Time  8    Period  Weeks    Status  Partially Met      PT LONG TERM GOAL #3   Title  Patient will ambulate 40 ft with walker with no breaks to allow for increased mobility within home.     Baseline  12/26: 29 ft with walker and WC follow with no rest breaks; 2/18: multiple trials previous session of >100 ft     Time  8    Period  Weeks    Status  Achieved      PT LONG TERM GOAL #4   Title  Patient will increase BLE gross strength to 4+/5 as to improve functional strength for independent gait, increased standing tolerance and increased ADL ability.    Baseline   4-/5 RLE,     Time  8    Period  Weeks    Status  Partially Met    Target Date  09/10/17      PT LONG TERM GOAL #5   Title  Patient will increase lower extremity functional scale to >40/80 to demonstrate improved functional mobility and increased tolerance with ADLs.     Baseline  14/80; 12/26: 21/80; 2/18: 28/80  4/10; 25/80    Time  8    Period  Weeks    Status  Partially Met    Target Date   09/10/17      Additional Long Term Goals   Additional Long Term Goals  Yes      PT LONG TERM GOAL #6   Title  Patient will walk through a door involving one small step up/down to increase functional independence.     Baseline  require Min A and BUE support in // bars at this time    Time  8    Period  Weeks    Status  Partially Met      PT LONG TERM GOAL #7   Title  Patient will ambulate 40 ft with least assistive device and Supervision with no breaks to allow for increased mobility within home.     Baseline  requires use of walker and CGA    Time  8    Period  Weeks    Status  Partially Met    Target Date  09/10/17      PT LONG TERM GOAL #8   Title  Patient will perform 10 MWT in < 1 minute to demonstrate improved ambulatory velocity and capacity.     Baseline  4/10: 2 minutes 45 seconds    Time  8    Period  Weeks    Status  New    Target Date  09/10/17            Plan - 07/23/17 1506    Clinical Impression Statement  Patient challenged with weight shift onto RLE decreasing ability to clear LLE when marching and ambulating. Patient RLE is weaker than LLE and fatigues quicker. Patient will continue to benefit from skilled physical therapy to improve strength, balance, and safety.     Rehab Potential  Fair    Clinical Impairments Affecting Rehab Potential  hx of HTN, HLD, CVA, learning disability, Diabetes, brain tumor,     PT Frequency  2x / week    PT Duration  8 weeks    PT Treatment/Interventions  ADLs/Self Care Home Management;Aquatic Therapy;Ultrasound;Moist Heat;Traction;DME Instruction;Gait training;Stair training;Functional mobility training;Therapeutic activities;Therapeutic exercise;Orthotic Fit/Training;Neuromuscular re-education;Balance training;Patient/family education;Manual techniques;Wheelchair mobility training;Passive range of motion;Energy conservation;Taping;Visual/perceptual remediation/compensation    PT Next Visit Plan  floor recovery     PT Home  Exercise Plan  see sheet    Consulted and Agree with Plan of Care  Patient;Family member/caregiver       Patient will benefit from skilled therapeutic intervention in order to improve the following deficits and impairments:  Abnormal gait, Decreased activity tolerance, Decreased balance, Decreased knowledge of precautions, Decreased endurance, Decreased coordination, Decreased knowledge of use of DME, Decreased mobility, Decreased range of motion, Difficulty walking, Decreased safety awareness, Decreased strength, Impaired flexibility, Impaired perceived functional ability, Impaired tone, Postural dysfunction, Improper body mechanics, Pain  Visit Diagnosis: Muscle weakness (generalized)  Other lack of coordination  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 Acute CVA (cerebrovascular accident) ([place]) 05/02/2016  . Left-sided weakness 05/01/2016  Janna Arch 07/23/2017, 3:08 PM  [place] [employer] MAIN [facility] SERVICES [address] [place], [place], [address] Phone: [phone]   Fax:  [phone]  Name: Curtis Powell MRN: [id number] Date of Birth: 1965-10-15

## 2017-07-23 NOTE — Therapy (Addendum)
[place] MAIN [facility] SERVICES [address] [place], [place], [address] Phone: [phone]   Fax:  [phone]  Occupational Therapy Progress Note/Recertification Note  Dates of reporting period  05/07/2017  to  07/23/2017  Patient Details  Name: Curtis Powell MRN: [id number] Date of Birth: 1965/06/15 Referring Provider: Dr. Larinda Buttery   Encounter Date: 07/23/2017  OT End of Session - 07/23/17 1410    Visit Number  27    Number of Visits  36    Date for OT Re-Evaluation  10/15/2017    Authorization Type  Vist 10 of 10 for progress reporting period starting 05/07/2017    OT Start Time  1355    OT Stop Time  1430    OT Time Calculation (min)  35 min    Activity Tolerance  Patient tolerated treatment well    Behavior During Therapy  [facility] for tasks assessed/performed       Past Medical History:  Diagnosis Date  . GERD (gastroesophageal reflux disease)   . Hyperlipidemia   . Hypertension   . Obesity   . Stroke [facility])     Past Surgical History:  Procedure Laterality Date  . BRAIN SURGERY      There were no vitals filed for this visit.  Subjective Assessment - 07/23/17 1408    Subjective   Pt. reports that his right shoulder is feeling better overall, and has no pain.    Patient is accompained by:  Family member    Pertinent History  Pt. is a 52 y.o. male who suffered a CVA on 05/01/2016. Pt. was admitted to the hospital. Once discharged, he received Home Health PT and OT services for about a month. Pt. has had multiple CVAs over the past 8 years, and has had multiple falls in the past 6 months. Pt. resides in an apartment. Pt. has caregivers for 80 hours. Pt.'s mother stays with pt. at night, and assists with IADL tasks.      Patient Stated Goals  To be able to throw a ball and dribble a ball.    Currently in Pain?  Yes       OT TREATMENT    Pt. goals were reviewed with pt. for progressing towards ADLs, and  IADLs.  Therapeutic Exercise:  Pt. Tolerated AAROM/AROM for the right shoulder flexion abduction, adduction, horizontal abduction. Pt. Worked on UE strengthening using red theraband for left shoulder flexion, abduction, bilateral elbow flexion, and extension.                          OT Education - 07/23/17 1409    Education provided  Yes    Education Details  [facility] skills    Person(s) Educated  Patient    Methods  Explanation;Demonstration;Verbal cues    Comprehension  Verbalized understanding;Returned demonstration       OT Short Term Goals - 03/03/17 1459      OT SHORT TERM GOAL #1   Title  `        OT Long Term Goals - 07/23/17 1421      OT LONG TERM GOAL #2   Title  Pt. will complete self grooming with minA    Baseline  Pt. continues to require extensive assist. Pt. is improving indedependent with oral care with set-up, however requires extensive assist for self-grooming tasls.    Time  12    Period  Weeks    Status  On-going    Target Date  10/15/17      OT LONG TERM GOAL #3   Title  Pt. will perform self-feeding skills with modified indepndence    Baseline   07/23/2017: Pt. is able to sccop, and spear the food off of the plate, however requires assist for the hand to mouth pattern. Pt. no longer needs to use built-up foam handle for utensils.    Time  12    Period  Weeks    Status  New    Target Date  10/15/17      OT LONG TERM GOAL #4   Title  Pt. will perform self dressing with minA and A/E as needed.    Baseline  07/23/2017: Pt. continues to require extensive assist: Pt. continues to have difficulty perfroming UE,a nd LE dressing.    Time  12    Period  Weeks    Status  On-going    Target Date  10/15/17      OT LONG TERM GOAL #5   Title  Pt. will perform light home making tasks with minA    Baseline  07/23/2017: Pt. continues to have difficulty with folding laundry, washing dishes while standing at the sinkside. Pt. is able to make a  sandwich, and heat things up in th microwave.    Time  12    Period  Weeks    Status  On-going    Target Date  10/15/17      OT LONG TERM GOAL #6   Title  Pt. will write his name efficiently with 100% legibility    Baseline  07/23/2017: 60% legibility for name.    Time  12    Period  Weeks    Status  On-going    Target Date  10/15/17            Plan - 07/23/17 1411    Clinical Impression Statement  Pt.'s RUE arm pain fluctuates having no pain today. Pt. is making progress overall, and is now doirng more at home with ADLs, and IADLs at home. Pt. is now prepraing his own sandwiches, and getting thing out of the refrigerator. Pt. reports being able to heat things up in the microwave. Pt. is having difficulty peeling an orange. Pt. continues to work on improving UE strength, and functioning for improved engagement in ADLs, and IADL tasks. Pt. continues to make progress. Goals were reviewed  with the pt.     Occupational performance deficits (Please refer to evaluation for details):  ADL's;IADL's    Rehab Potential  Fair    OT Frequency  2x / week    OT Duration  12 weeks    OT Treatment/Interventions  Self-care/ADL training;Patient/family education;DME and/or AE instruction;Therapeutic exercise;Moist Heat;Neuromuscular education    Clinical Decision Making  Several treatment options, min-mod task modification necessary    Consulted and Agree with Plan of Care  Patient       Patient will benefit from skilled therapeutic intervention in order to improve the following deficits and impairments:  Pain, Decreased cognition, Decreased activity tolerance, Impaired UE functional use, Decreased range of motion, Decreased coordination, Decreased endurance  Visit Diagnosis: Muscle weakness (generalized) - Plan: Ot plan of care cert/re-cert  Other lack of coordination - Plan: Ot plan of care cert/re-cert    Problem List Patient Active Problem List   Diagnosis Date Noted  . Acute CVA  (cerebrovascular accident) ([place]) 05/02/2016  . Left-sided weakness 05/01/2016    Harrel Carina, MS,  OTR/L 07/23/2017, 6:05 PM  [place] MAIN [facility] SERVICES [address] [place], [place], [address] Phone: [phone]   Fax:  [phone]  Name: Curtis Powell MRN: [id number] Date of Birth: 10-Dec-1965

## 2017-07-28 ENCOUNTER — Encounter: Payer: Self-pay | Admitting: Occupational Therapy

## 2017-07-28 ENCOUNTER — Other Ambulatory Visit: Payer: Self-pay

## 2017-07-28 ENCOUNTER — Ambulatory Visit: Payer: Medicare HMO | Admitting: Occupational Therapy

## 2017-07-28 ENCOUNTER — Ambulatory Visit: Payer: Medicare HMO

## 2017-07-28 DIAGNOSIS — M6281 Muscle weakness (generalized): Secondary | ICD-10-CM | POA: Diagnosis not present

## 2017-07-28 DIAGNOSIS — I69351 Hemiplegia and hemiparesis following cerebral infarction affecting right dominant side: Secondary | ICD-10-CM

## 2017-07-28 DIAGNOSIS — R278 Other lack of coordination: Secondary | ICD-10-CM

## 2017-07-28 DIAGNOSIS — R2689 Other abnormalities of gait and mobility: Secondary | ICD-10-CM

## 2017-07-28 NOTE — Therapy (Signed)
[place] [facility] MAIN REHAB SERVICES [address] , [place], [address] Phone: [phone]   Fax:  [phone]  Physical Therapy Treatment  Patient Details  Name: Curtis Powell MRN: [id number] Date of Birth: 11/19/1965 Referring Provider: Dr. Santayana Gloria Santaya   Encounter Date: 07/28/2017  PT End of Session - 07/28/17 1438    Visit Number  37    Number of Visits  48    Date for PT Re-Evaluation  09/10/17    Authorization Type  4/10 progress note    PT Start Time  1431    PT Stop Time  1515    PT Time Calculation (min)  44 min    Equipment Utilized During Treatment  Gait belt    Activity Tolerance  Patient tolerated treatment well;No increased pain    Behavior During Therapy  WFL for tasks assessed/performed       Past Medical History:  Diagnosis Date  . GERD (gastroesophageal reflux disease)   . Hyperlipidemia   . Hypertension   . Obesity   . Stroke (HCC)     Past Surgical History:  Procedure Laterality Date  . BRAIN SURGERY      There were no vitals filed for this visit.  Subjective Assessment - 07/28/17 1436    Subjective  Patient reports 6/10 shoulder pain. Had a good easter with just his mom. Reports compliance with HEP.     Pertinent History   Patient is a pleasant 51 year old male who presents to physical therapy for weakness and immobility secondary to CVA.  Had a stroke in Feb 2018. He was previously fully independent, but this stroke caused severe residual deficits, mainly on the right side as well as speech, and now he is unable to walk or perform most of his ADLs on his own. Entire right side is very weak. He still has a little difficulty with speech but his swallowing is improved to baseline. His mom had to move in with him and now is his main caregiver.     Limitations  Sitting;Lifting;Standing;Walking;House hold activities    How long can you sit comfortably?  5 minutes    How long can you stand comfortably?   5 minutes    How long can you walk comfortably?  10 ft    Patient Stated Goals  Walk without walker, walk further with walker, get some strength in back     Currently in Pain?  Yes    Pain Score  6     Pain Location  Shoulder    Pain Orientation  Right    Pain Descriptors / Indicators  Aching    Pain Type  Chronic pain    Pain Onset  More than a month ago    Pain Frequency  Intermittent     Nustep no UE(LE only ) lvl 2 4 minutes cues for over 60 rpm for cardiovascular    4" step up : step up , cues for up with the good down with the bad, hand placement and foot positioning.    4" step toe taps 12 x each leg, BUE support   Green mat under LLE to promote weight shift onto RLE. 3 minutes. Challenging to patient.    Airex pad balance: one hand flat 3 minutes with tactile cueing : improved upright posture: Min A for posture correction.   Lunge stretch in // bars 2x60 seconds  Abduction 10 x each leg standing BUE support  Extension 10x   each leg standing BUE support    Pt. response to medical necessity: Patient will continue to benefit from skilled physical therapy to improve strength balance and gait safety.                          PT Education - 07/28/17 1437    Education provided  Yes    Education Details  standing interventions, exercise technique     Person(s) Educated  Patient    Methods  Explanation;Demonstration;Verbal cues    Comprehension  Verbalized understanding       PT Short Term Goals - 07/16/17 1531      PT SHORT TERM GOAL #1   Title  Patient will be independent in home exercise program to improve strength/mobility for better functional independence with ADLs.    Baseline  hep compliant    Time  2    Period  Weeks    Status  Achieved      PT SHORT TERM GOAL #2   Title  Patient will require min cueing for STS transfer with CGA for increased independence with mobility.     Baseline  CGA    Time  2    Period  Weeks    Status   Achieved      PT SHORT TERM GOAL #3   Title  Patient will maintain upright posture for > 15 seconds to demonstrate strengthened postural control muscualture     Baseline  18 seconds     Time  2    Period  Weeks    Status  Achieved      PT SHORT TERM GOAL #4   Title  Patient will perform         PT Long Term Goals - 07/16/17 1434      PT LONG TERM GOAL #1   Title  Patient will perform bed mobility with Supervision to increase independent mobility    Baseline  able to perform mod I for increased time , occasional Min A for scooting up in bed    Time  8    Period  Weeks    Status  Partially Met      PT LONG TERM GOAL #2   Title  Patient (< 4 years old) will complete five times sit to stand test in < 10 seconds indicating an increased LE strength and improved balance.    Baseline  2/18: 30 seconds from plinth chair height single UE on seat and single on walker ; 32 seconds from plinth with single UE on seat and single on walker. 4/10: 26 seconds from [place] with one hand on walker    Time  8    Period  Weeks    Status  Partially Met      PT LONG TERM GOAL #3   Title  Patient will ambulate 40 ft with walker with no breaks to allow for increased mobility within home.     Baseline  12/26: 29 ft with walker and WC follow with no rest breaks; 2/18: multiple trials previous session of >100 ft     Time  8    Period  Weeks    Status  Achieved      PT LONG TERM GOAL #4   Title  Patient will increase BLE gross strength to 4+/5 as to improve functional strength for independent gait, increased standing tolerance and increased ADL ability.    Baseline   4-/5 RLE,  Time  8    Period  Weeks    Status  Partially Met    Target Date  09/10/17      PT LONG TERM GOAL #5   Title  Patient will increase lower extremity functional scale to >40/80 to demonstrate improved functional mobility and increased tolerance with ADLs.     Baseline  14/80; 12/26: 21/80; 2/18: 28/80  4/10; 25/80    Time  8     Period  Weeks    Status  Partially Met    Target Date  09/10/17      Additional Long Term Goals   Additional Long Term Goals  Yes      PT LONG TERM GOAL #6   Title  Patient will walk through a door involving one small step up/down to increase functional independence.     Baseline  require Min A and BUE support in // bars at this time    Time  8    Period  Weeks    Status  Partially Met      PT LONG TERM GOAL #7   Title  Patient will ambulate 40 ft with least assistive device and Supervision with no breaks to allow for increased mobility within home.     Baseline  requires use of walker and CGA    Time  8    Period  Weeks    Status  Partially Met    Target Date  09/10/17      PT LONG TERM GOAL #8   Title  Patient will perform 10 MWT in < 1 minute to demonstrate improved ambulatory velocity and capacity.     Baseline  4/10: 2 minutes 45 seconds    Time  8    Period  Weeks    Status  New    Target Date  09/10/17            Plan - 07/28/17 1456    Clinical Impression Statement  Patient has occasional buckling of R knee with fatigue,however responds well with cueing of activation of quadriceps musculature. Weight shift onto RLE challenging to patient resulting in fatigue. Patient will continue to benefit from skilled physical therapy to improve strength balance and gait safety.    Rehab Potential  Fair    Clinical Impairments Affecting Rehab Potential  hx of HTN, HLD, CVA, learning disability, Diabetes, brain tumor,     PT Frequency  2x / week    PT Duration  8 weeks    PT Treatment/Interventions  ADLs/Self Care Home Management;Aquatic Therapy;Ultrasound;Moist Heat;Traction;DME Instruction;Gait training;Stair training;Functional mobility training;Therapeutic activities;Therapeutic exercise;Orthotic Fit/Training;Neuromuscular re-education;Balance training;Patient/family education;Manual techniques;Wheelchair mobility training;Passive range of motion;Energy  conservation;Taping;Visual/perceptual remediation/compensation    PT Next Visit Plan  floor recovery     PT Home Exercise Plan  see sheet    Consulted and Agree with Plan of Care  Patient;Family member/caregiver       Patient will benefit from skilled therapeutic intervention in order to improve the following deficits and impairments:  Abnormal gait, Decreased activity tolerance, Decreased balance, Decreased knowledge of precautions, Decreased endurance, Decreased coordination, Decreased knowledge of use of DME, Decreased mobility, Decreased range of motion, Difficulty walking, Decreased safety awareness, Decreased strength, Impaired flexibility, Impaired perceived functional ability, Impaired tone, Postural dysfunction, Improper body mechanics, Pain  Visit Diagnosis: Muscle weakness (generalized)  Other lack of coordination  Hemiplegia and hemiparesis following cerebral infarction affecting right dominant side (HCC)  Other abnormalities of gait and mobility       Problem List Patient Active Problem List   Diagnosis Date Noted  . Acute CVA (cerebrovascular accident) ([place]) 05/02/2016  . Left-sided weakness 05/01/2016   Janna Arch, PT, DPT   07/28/2017, 3:17 PM  [place] MAIN [facility] SERVICES [address] [place], [place], [address] Phone: [phone]   Fax:  [phone]  Name: Curtis Powell MRN: [id number] Date of Birth: April 06, 1966

## 2017-07-28 NOTE — Therapy (Signed)
[place] MAIN [facility] SERVICES [address] [place], [place], [address] Phone: [phone]   Fax:  [phone]  Occupational Therapy Treatment  Patient Details  Name: Curtis Powell MRN: [id number] Date of Birth: 1965-10-29 Referring Provider: Dr. Larinda Buttery   Encounter Date: 07/28/2017  OT End of Session - 07/28/17 1354    Visit Number  28    Number of Visits  36    Date for OT Re-Evaluation  10/15/17    Authorization Type  Vist 1 of 10 for progress reporting period starting 07/28/2017    OT Start Time  1350    OT Stop Time  1430    OT Time Calculation (min)  40 min    Activity Tolerance  Patient tolerated treatment well    Behavior During Therapy  Curtis Powell for tasks assessed/performed       Past Medical History:  Diagnosis Date  . GERD (gastroesophageal reflux disease)   . Hyperlipidemia   . Hypertension   . Obesity   . Stroke [name])     Past Surgical History:  Procedure Laterality Date  . BRAIN SURGERY      There were no vitals filed for this visit.  Subjective Assessment - 07/28/17 1353    Subjective   Pt. reports he is feeling well.    Patient is accompained by:  Family member    Pertinent History  Pt. is a 52 y.o. male who suffered a CVA on 05/01/2016. Pt. was admitted to the hospital. Once discharged, he received Home Health PT and OT services for about a month. Pt. has had multiple CVAs over the past 8 years, and has had multiple falls in the past 6 months. Pt. resides in an apartment. Pt. has caregivers for 80 hours. Pt.'s mother stays with pt. at night, and assists with IADL tasks.      Patient Stated Goals  To be able to throw a ball and dribble a ball.    Currently in Pain?  Yes    Pain Location  Shoulder    Pain Orientation  Right      Therapeutic Exercise:  Pt. Tolerated AAROM/AROM for the right shoulder flexion abduction, adduction, horizontal abduction. Pt. worked on UE strengthening using red  theraband for left shoulder flexion, abduction, bilateral elbow flexion, and extension.  Neuromuscular re-ed:  Pt. Worked on grasping coins from a tabletop surface, placing them into a resistive container. Pt. Worked on translatory movements of the hand.                         OT Education - 07/28/17 1354    Education provided  Yes    Education Details  UE strength, and [facility]    Person(s) Educated  Patient    Methods  Explanation;Demonstration;Verbal cues    Comprehension  Verbalized understanding;Returned demonstration       OT Short Term Goals - 03/03/17 1459      OT SHORT TERM GOAL #1   Title  `        OT Long Term Goals - 07/23/17 1421      OT LONG TERM GOAL #2   Title  Pt. will complete self grooming with minA    Baseline  Pt. continues to require extensive assist. Pt. is improving indedependent with oral care with set-up, however requires extensive assist for self-grooming tasls.    Time  12    Period  Weeks  Status  On-going    Target Date  10/15/17      OT LONG TERM GOAL #3   Title  Pt. will perform self-feeding skills with modified indepndence    Baseline   07/23/2017: Pt. is able to sccop, and spear the food off of the plate, however requires assist for the hand to mouth pattern. Pt. no longer needs to use built-up foam handle for utensils.    Time  12    Period  Weeks    Status  New    Target Date  10/15/17      OT LONG TERM GOAL #4   Title  Pt. will perform self dressing with minA and A/E as needed.    Baseline  07/23/2017: Pt. continues to require extensive assist: Pt. continues to have difficulty perfroming UE,a nd LE dressing.    Time  12    Period  Weeks    Status  On-going    Target Date  10/15/17      OT LONG TERM GOAL #5   Title  Pt. will perform light home making tasks with minA    Baseline  07/23/2017: Pt. continues to have difficulty with folding laundry, washing dishes while standing at the sinkside. Pt. is able to make a  sandwich, and heat things up in th microwave.    Time  12    Period  Weeks    Status  On-going    Target Date  10/15/17      OT LONG TERM GOAL #6   Title  Pt. will write his name efficiently with 100% legibility    Baseline  07/23/2017: 60% legibility for name.    Time  12    Period  Weeks    Status  On-going    Target Date  10/15/17            Plan - 07/28/17 1355    Clinical Impression Statement  Pt. is now more consistently making his own sandwiches, tying his shoes, and opening doorknobs. Pt. continues to work on improving BUE strength, and coordination skills for improved engagement in ADL, and IADL tasks.    Occupational performance deficits (Please refer to evaluation for details):  IADL's    Rehab Potential  Fair    OT Frequency  2x / week    OT Duration  12 weeks    OT Treatment/Interventions  Self-care/ADL training;Patient/family education;DME and/or AE instruction;Therapeutic exercise;Moist Heat;Neuromuscular education    Clinical Decision Making  Several treatment options, min-mod task modification necessary    Consulted and Agree with Plan of Care  Patient       Patient will benefit from skilled therapeutic intervention in order to improve the following deficits and impairments:  Pain, Decreased cognition, Decreased activity tolerance, Impaired UE functional use, Decreased range of motion, Decreased coordination, Decreased endurance  Visit Diagnosis: Muscle weakness (generalized)  Other lack of coordination    Problem List Patient Active Problem List   Diagnosis Date Noted  . Acute CVA (cerebrovascular accident) ([place]) 05/02/2016  . Left-sided weakness 05/01/2016    Harrel Carina, MS, OTR/L 07/28/2017, 2:11 PM  [place] MAIN [facility] SERVICES [address] [place], [place], [address] Phone: [phone]   Fax:  [phone]  Name: Curtis Powell MRN: [id number] Date of Birth: Jun 16, 1965

## 2017-07-30 ENCOUNTER — Encounter: Payer: Self-pay | Admitting: Occupational Therapy

## 2017-07-30 ENCOUNTER — Ambulatory Visit: Payer: Medicare HMO | Admitting: Occupational Therapy

## 2017-07-30 ENCOUNTER — Ambulatory Visit: Payer: Medicare HMO

## 2017-07-30 DIAGNOSIS — R278 Other lack of coordination: Secondary | ICD-10-CM

## 2017-07-30 DIAGNOSIS — R2689 Other abnormalities of gait and mobility: Secondary | ICD-10-CM

## 2017-07-30 DIAGNOSIS — M6281 Muscle weakness (generalized): Secondary | ICD-10-CM

## 2017-07-30 DIAGNOSIS — I69351 Hemiplegia and hemiparesis following cerebral infarction affecting right dominant side: Secondary | ICD-10-CM

## 2017-07-30 NOTE — Therapy (Signed)
[place] MAIN [facility] SERVICES [address] [place], [place], [address] Phone: [phone]   Fax:  [phone]  Physical Therapy Treatment  Patient Details  Name: Curtis Powell MRN: [id number] Date of Birth: 01-31-1966 Referring Provider: Dr. Larinda Buttery   Encounter Date: 07/30/2017  PT End of Session - 07/30/17 1310    Visit Number  38    Number of Visits  48    Date for PT Re-Evaluation  09/10/17    Authorization Type  5/10 progress note    PT Start Time  1301    PT Stop Time  1345    PT Time Calculation (min)  44 min    Equipment Utilized During Treatment  Gait belt    Activity Tolerance  Patient tolerated treatment well;No increased pain    Behavior During Therapy  WFL for tasks assessed/performed       Past Medical History:  Diagnosis Date  . GERD (gastroesophageal reflux disease)   . Hyperlipidemia   . Hypertension   . Obesity   . Stroke [facility])     Past Surgical History:  Procedure Laterality Date  . BRAIN SURGERY      There were no vitals filed for this visit.  Subjective Assessment - 07/30/17 1309    Subjective  Patient reports no stumbles or falls since last session. Compliant with HEP.     Pertinent History   Patient is a pleasant 52 year old male who presents to physical therapy for weakness and immobility secondary to CVA.  Had a stroke in Feb 2018. He was previously fully independent, but this stroke caused severe residual deficits, mainly on the right side as well as speech, and now he is unable to walk or perform most of his ADLs on his own. Entire right side is very weak. He still has a little difficulty with speech but his swallowing is improved to baseline. His mom had to move in with him and now is his main caregiver.     Limitations  Sitting;Lifting;Standing;Walking;House hold activities    How long can you sit comfortably?  5 minutes    How long can you stand comfortably?  5 minutes    How long  can you walk comfortably?  10 ft    Patient Stated Goals  Walk without walker, walk further with walker, get some strength in back     Currently in Pain?  Yes    Pain Score  1     Pain Location  Shoulder    Pain Orientation  Right    Pain Descriptors / Indicators  Aching    Pain Type  Chronic pain    Pain Onset  More than a month ago    Pain Frequency  Intermittent       Nustep no UE(LE only ) lvl 2 4 minutes cues for over 60 rpm for cardiovascular     4" step up : step up , cues for up with the good down with the bad, hand placement and foot positioning. Challenged with clearance of RLE with eccentric step down.    4" step toe taps 12 x each leg, BUE support; occasional RLE buckling that reduced with patient taking full breath in and out prior to moving LLE.     Green mat under LLE to promote weight shift onto RLE. 3 minutes. Challenging to patient.    Airex pad balance: one hand flat 3 minutes with tactile cueing : improved upright posture:  Min A for posture correction.    Walking backwards in // bars 4x length of bars  Side stepping in // bars 4x length of bars    Pt. response to medical necessity:  Patient will continue to benefit from skilled physical therapy to improve strength balance and gait safety.                         PT Education - 07/30/17 1310    Education provided  Yes    Education Details  exercise technique, stair negotiation     Person(s) Educated  Patient    Methods  Explanation;Demonstration;Verbal cues    Comprehension  Verbalized understanding;Returned demonstration       PT Short Term Goals - 07/16/17 1531      PT SHORT TERM GOAL #1   Title  Patient will be independent in home exercise program to improve strength/mobility for better functional independence with ADLs.    Baseline  hep compliant    Time  2    Period  Weeks    Status  Achieved      PT SHORT TERM GOAL #2   Title  Patient will require min cueing for STS  transfer with CGA for increased independence with mobility.     Baseline  CGA    Time  2    Period  Weeks    Status  Achieved      PT SHORT TERM GOAL #3   Title  Patient will maintain upright posture for > 15 seconds to demonstrate strengthened postural control muscualture     Baseline  18 seconds     Time  2    Period  Weeks    Status  Achieved      PT SHORT TERM GOAL #4   Title  Patient will perform         PT Long Term Goals - 07/16/17 1434      PT LONG TERM GOAL #1   Title  Patient will perform bed mobility with Supervision to increase independent mobility    Baseline  able to perform mod I for increased time , occasional Min A for scooting up in bed    Time  8    Period  Weeks    Status  Partially Met      PT LONG TERM GOAL #2   Title  Patient (< 65 years old) will complete five times sit to stand test in < 10 seconds indicating an increased LE strength and improved balance.    Baseline  2/18: 30 seconds from plinth chair height single UE on seat and single on walker ; 32 seconds from plinth with single UE on seat and single on walker. 4/10: 26 seconds from [place] with one hand on walker    Time  8    Period  Weeks    Status  Partially Met      PT LONG TERM GOAL #3   Title  Patient will ambulate 40 ft with walker with no breaks to allow for increased mobility within home.     Baseline  12/26: 29 ft with walker and WC follow with no rest breaks; 2/18: multiple trials previous session of >100 ft     Time  8    Period  Weeks    Status  Achieved      PT LONG TERM GOAL #4   Title  Patient will increase BLE gross strength to 4+/5 as  to improve functional strength for independent gait, increased standing tolerance and increased ADL ability.    Baseline   4-/5 RLE,     Time  8    Period  Weeks    Status  Partially Met    Target Date  09/10/17      PT LONG TERM GOAL #5   Title  Patient will increase lower extremity functional scale to >40/80 to demonstrate improved  functional mobility and increased tolerance with ADLs.     Baseline  14/80; 12/26: 21/80; 2/18: 28/80  4/10; 25/80    Time  8    Period  Weeks    Status  Partially Met    Target Date  09/10/17      Additional Long Term Goals   Additional Long Term Goals  Yes      PT LONG TERM GOAL #6   Title  Patient will walk through a door involving one small step up/down to increase functional independence.     Baseline  require Min A and BUE support in // bars at this time    Time  8    Period  Weeks    Status  Partially Met      PT LONG TERM GOAL #7   Title  Patient will ambulate 40 ft with least assistive device and Supervision with no breaks to allow for increased mobility within home.     Baseline  requires use of walker and CGA    Time  8    Period  Weeks    Status  Partially Met    Target Date  09/10/17      PT LONG TERM GOAL #8   Title  Patient will perform 10 MWT in < 1 minute to demonstrate improved ambulatory velocity and capacity.     Baseline  4/10: 2 minutes 45 seconds    Time  8    Period  Weeks    Status  New    Target Date  09/10/17            Plan - 07/30/17 1330    Clinical Impression Statement  Patient initially demonstrated RLE buckling when performing SLS with BUE support however with cueing of setting leg with concurrent breathing decreased episodes of buckling noted. Challenged eccentric portion of descending step.  Patient will continue to benefit from skilled physical therapy to improve strength balance and gait safety    Rehab Potential  Fair    Clinical Impairments Affecting Rehab Potential  hx of HTN, HLD, CVA, learning disability, Diabetes, brain tumor,     PT Frequency  2x / week    PT Duration  8 weeks    PT Treatment/Interventions  ADLs/Self Care Home Management;Aquatic Therapy;Ultrasound;Moist Heat;Traction;DME Instruction;Gait training;Stair training;Functional mobility training;Therapeutic activities;Therapeutic exercise;Orthotic  Fit/Training;Neuromuscular re-education;Balance training;Patient/family education;Manual techniques;Wheelchair mobility training;Passive range of motion;Energy conservation;Taping;Visual/perceptual remediation/compensation    PT Next Visit Plan  floor recovery     PT Home Exercise Plan  see sheet    Consulted and Agree with Plan of Care  Patient;Family member/caregiver       Patient will benefit from skilled therapeutic intervention in order to improve the following deficits and impairments:  Abnormal gait, Decreased activity tolerance, Decreased balance, Decreased knowledge of precautions, Decreased endurance, Decreased coordination, Decreased knowledge of use of DME, Decreased mobility, Decreased range of motion, Difficulty walking, Decreased safety awareness, Decreased strength, Impaired flexibility, Impaired perceived functional ability, Impaired tone, Postural dysfunction, Improper body mechanics, Pain  Visit Diagnosis: Muscle  weakness (generalized)  Other lack of coordination  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 Acute CVA (cerebrovascular accident) ([place]) 05/02/2016  . Left-sided weakness 05/01/2016   Janna Arch, PT, DPT   07/30/2017, 2:33 PM  [place] MAIN [facility] SERVICES [address] [place], [place], [address] Phone: [phone]   Fax:  [phone]  Name: Curtis Powell MRN: [id number] Date of Birth: Aug 14, 1965

## 2017-07-30 NOTE — Therapy (Signed)
[place] MAIN [facility] SERVICES [address] [place], [place], [address] Phone: [phone]   Fax:  [phone]  Occupational Therapy Treatment  Patient Details  Name: Curtis Powell MRN: [id number] Date of Birth: 03/25/1966 Referring Provider: Dr. Larinda Buttery   Encounter Date: 07/30/2017  OT End of Session - 07/30/17 1405    Visit Number  29    Number of Visits  36    Date for OT Re-Evaluation  10/15/17    Authorization Type  Vist 2 of 10 for progress reporting period starting 07/28/2017    OT Start Time  1348    OT Stop Time  1430    OT Time Calculation (min)  42 min    Activity Tolerance  Patient tolerated treatment well    Behavior During Therapy  [facility] for tasks assessed/performed       Past Medical History:  Diagnosis Date  . GERD (gastroesophageal reflux disease)   . Hyperlipidemia   . Hypertension   . Obesity   . Stroke [facility])     Past Surgical History:  Procedure Laterality Date  . BRAIN SURGERY      There were no vitals filed for this visit.  Subjective Assessment - 07/30/17 1403    Subjective   Pt. reports he is feeling well today.    Patient is accompained by:  Family member    Pertinent History  Pt. is a 52 y.o. male who suffered a CVA on 05/01/2016. Pt. was admitted to the hospital. Once discharged, he received Home Health PT and OT services for about a month. Pt. has had multiple CVAs over the past 8 years, and has had multiple falls in the past 6 months. Pt. resides in an apartment. Pt. has caregivers for 80 hours. Pt.'s mother stays with pt. at night, and assists with IADL tasks.      Patient Stated Goals  To be able to throw a ball and dribble a ball.    Currently in Pain?  Yes    Pain Score  3     Pain Location  Shoulder    Pain Orientation  Right    Pain Descriptors / Indicators  Aching      OT TREATMENT    Therapeutic Exercise:  Pt. tolerated AAROM/AROM for the right shoulder flexion  abduction, adduction, horizontal abduction. Pt. worked on UE strengthening using red theraband for left shoulder flexion, abduction, bilateral elbow flexion, and extension. Pt. performed gross gripping with grip strengthener. Pt. worked on sustaining grip while grasping pegs and reaching to place pegs in a container at the tabletop.The gripper was placed in the 4th resistive slot with the white resistive spring.                             OT Education - 07/30/17 1404    Education provided  Yes    Education Details  UE functioning.    Person(s) Educated  Patient    Methods  Explanation;Demonstration;Verbal cues    Comprehension  Verbalized understanding;Returned demonstration       OT Short Term Goals - 03/03/17 1459      OT SHORT TERM GOAL #1   Title  `        OT Long Term Goals - 07/23/17 1421      OT LONG TERM GOAL #2   Title  Pt. will complete self grooming with minA    Baseline  Pt. continues to require extensive assist. Pt. is improving indedependent with oral care with set-up, however requires extensive assist for self-grooming tasls.    Time  12    Period  Weeks    Status  On-going    Target Date  10/15/17      OT LONG TERM GOAL #3   Title  Pt. will perform self-feeding skills with modified indepndence    Baseline   07/23/2017: Pt. is able to sccop, and spear the food off of the plate, however requires assist for the hand to mouth pattern. Pt. no longer needs to use built-up foam handle for utensils.    Time  12    Period  Weeks    Status  New    Target Date  10/15/17      OT LONG TERM GOAL #4   Title  Pt. will perform self dressing with minA and A/E as needed.    Baseline  07/23/2017: Pt. continues to require extensive assist: Pt. continues to have difficulty perfroming UE,a nd LE dressing.    Time  12    Period  Weeks    Status  On-going    Target Date  10/15/17      OT LONG TERM GOAL #5   Title  Pt. will perform light home making  tasks with minA    Baseline  07/23/2017: Pt. continues to have difficulty with folding laundry, washing dishes while standing at the sinkside. Pt. is able to make a sandwich, and heat things up in th microwave.    Time  12    Period  Weeks    Status  On-going    Target Date  10/15/17      OT LONG TERM GOAL #6   Title  Pt. will write his name efficiently with 100% legibility    Baseline  07/23/2017: 60% legibility for name.    Time  12    Period  Weeks    Status  On-going    Target Date  10/15/17            Plan - 07/30/17 1406    Clinical Impression Statement  Pt. reports that he has a new caregiver now. The previous one left early, and arrvied late, and was not there when the pt. had to use the bathroom. Pt. is trying to get used to his new caregiver. Pt. continues to work on improving UE stregth, and coordination skills for improved ADL, and IADL functioning.    Occupational performance deficits (Please refer to evaluation for details):  ADL's;IADL's    Rehab Potential  Fair    OT Frequency  2x / week    OT Duration  12 weeks    OT Treatment/Interventions  Self-care/ADL training;Patient/family education;DME and/or AE instruction;Therapeutic exercise;Moist Heat;Neuromuscular education    Clinical Decision Making  Several treatment options, min-mod task modification necessary    Consulted and Agree with Plan of Care  Patient       Patient will benefit from skilled therapeutic intervention in order to improve the following deficits and impairments:  Pain, Decreased cognition, Decreased activity tolerance, Impaired UE functional use, Decreased range of motion, Decreased coordination, Decreased endurance  Visit Diagnosis: Muscle weakness (generalized)  Other lack of coordination    Problem List Patient Active Problem List   Diagnosis Date Noted  . Acute CVA (cerebrovascular accident) ([place]) 05/02/2016  . Left-sided weakness 05/01/2016    Harrel Carina, MS,  OTR/L 07/30/2017, 2:39 PM  [place]  [place] MAIN [facility] SERVICES [address] [place], [place], [address] Phone: [phone]   Fax:  [phone]  Name: Curtis Powell MRN: [id number] Date of Birth: 10-05-1965

## 2017-08-04 ENCOUNTER — Ambulatory Visit: Payer: Medicare HMO | Admitting: Occupational Therapy

## 2017-08-04 ENCOUNTER — Ambulatory Visit: Payer: Medicare HMO

## 2017-08-06 ENCOUNTER — Ambulatory Visit: Payer: Medicare HMO

## 2017-08-06 ENCOUNTER — Ambulatory Visit: Payer: Medicare HMO | Attending: Family Medicine | Admitting: Occupational Therapy

## 2017-08-06 DIAGNOSIS — I69351 Hemiplegia and hemiparesis following cerebral infarction affecting right dominant side: Secondary | ICD-10-CM

## 2017-08-06 DIAGNOSIS — M6281 Muscle weakness (generalized): Secondary | ICD-10-CM | POA: Diagnosis present

## 2017-08-06 DIAGNOSIS — R2689 Other abnormalities of gait and mobility: Secondary | ICD-10-CM

## 2017-08-06 DIAGNOSIS — R278 Other lack of coordination: Secondary | ICD-10-CM | POA: Diagnosis present

## 2017-08-06 NOTE — Therapy (Signed)
[place] MAIN [facility] SERVICES [address] [place], [place], [address] Phone: [phone]   Fax:  [phone]  Physical Therapy Treatment  Patient Details  Name: Curtis Powell MRN: [id number] Date of Birth: 02-Dec-1965 Referring Provider: Dr. Larinda Buttery   Encounter Date: 08/06/2017  PT End of Session - 08/06/17 1437    Visit Number  39    Number of Visits  8    Date for PT Re-Evaluation  09/10/17    Authorization Type  6/10 progress note    PT Start Time  1430    PT Stop Time  1514    PT Time Calculation (min)  44 min    Equipment Utilized During Treatment  Gait belt    Activity Tolerance  Patient tolerated treatment well;No increased pain    Behavior During Therapy  WFL for tasks assessed/performed       Past Medical History:  Diagnosis Date  . GERD (gastroesophageal reflux disease)   . Hyperlipidemia   . Hypertension   . Obesity   . Stroke [facility])     Past Surgical History:  Procedure Laterality Date  . BRAIN SURGERY      There were no vitals filed for this visit.  Subjective Assessment - 08/06/17 1436    Subjective  Patient reports compliance with HEP and walking at home. No complaints or concerns.     Pertinent History   Patient is a pleasant 52 year old male who presents to physical therapy for weakness and immobility secondary to CVA.  Had a stroke in Feb 2018. He was previously fully independent, but this stroke caused severe residual deficits, mainly on the right side as well as speech, and now he is unable to walk or perform most of his ADLs on his own. Entire right side is very weak. He still has a little difficulty with speech but his swallowing is improved to baseline. His mom had to move in with him and now is his main caregiver.     Limitations  Sitting;Lifting;Standing;Walking;House hold activities    How long can you sit comfortably?  5 minutes    How long can you stand comfortably?  5 minutes    How  long can you walk comfortably?  10 ft    Patient Stated Goals  Walk without walker, walk further with walker, get some strength in back     Currently in Pain?  Yes    Pain Score  2     Pain Location  Shoulder    Pain Orientation  Right    Pain Descriptors / Indicators  Aching    Pain Type  Chronic pain    Pain Onset  More than a month ago    Pain Frequency  Intermittent       Nustep Lvl 4 ; 4 minutes cues for over 60 rpm for cardiovascular     4" step up : step up , cues for up with the good down with the bad, hand placement and foot positioning. Challenged with clearance of RLE with eccentric step down.    4" step toe taps 12 x each leg, BUE support; occasional RLE buckling that reduced with patient taking full breath in and out prior to moving LLE.     5x STS from plinth with focus on transferring with LE only rather than with primary UE assist.  Ambulate 151f in hallway with CGA.; cues for upright posture, increased hip flexion. Occasional buckling of RLE  and hyperextension of R knee.     Pt. response to medical necessity:  Patient will continue to benefit from skilled physical therapy to improve strength balance and gait safety                          PT Education - 08/06/17 1437    Education provided  Yes    Education Details  exercise technique, pre gait and gait interventions     Person(s) Educated  Patient    Methods  Explanation;Demonstration;Verbal cues    Comprehension  Verbalized understanding;Returned demonstration       PT Short Term Goals - 07/16/17 1531      PT SHORT TERM GOAL #1   Title  Patient will be independent in home exercise program to improve strength/mobility for better functional independence with ADLs.    Baseline  hep compliant    Time  2    Period  Weeks    Status  Achieved      PT SHORT TERM GOAL #2   Title  Patient will require min cueing for STS transfer with CGA for increased independence with mobility.      Baseline  CGA    Time  2    Period  Weeks    Status  Achieved      PT SHORT TERM GOAL #3   Title  Patient will maintain upright posture for > 15 seconds to demonstrate strengthened postural control muscualture     Baseline  18 seconds     Time  2    Period  Weeks    Status  Achieved      PT SHORT TERM GOAL #4   Title  Patient will perform         PT Long Term Goals - 07/16/17 1434      PT LONG TERM GOAL #1   Title  Patient will perform bed mobility with Supervision to increase independent mobility    Baseline  able to perform mod I for increased time , occasional Min A for scooting up in bed    Time  8    Period  Weeks    Status  Partially Met      PT LONG TERM GOAL #2   Title  Patient (< 54 years old) will complete five times sit to stand test in < 10 seconds indicating an increased LE strength and improved balance.    Baseline  2/18: 30 seconds from plinth chair height single UE on seat and single on walker ; 32 seconds from plinth with single UE on seat and single on walker. 4/10: 26 seconds from [place] with one hand on walker    Time  8    Period  Weeks    Status  Partially Met      PT LONG TERM GOAL #3   Title  Patient will ambulate 40 ft with walker with no breaks to allow for increased mobility within home.     Baseline  12/26: 29 ft with walker and WC follow with no rest breaks; 2/18: multiple trials previous session of >100 ft     Time  8    Period  Weeks    Status  Achieved      PT LONG TERM GOAL #4   Title  Patient will increase BLE gross strength to 4+/5 as to improve functional strength for independent gait, increased standing tolerance and increased ADL ability.  Baseline   4-/5 RLE,     Time  8    Period  Weeks    Status  Partially Met    Target Date  09/10/17      PT LONG TERM GOAL #5   Title  Patient will increase lower extremity functional scale to >40/80 to demonstrate improved functional mobility and increased tolerance with ADLs.     Baseline   14/80; 12/26: 21/80; 2/18: 28/80  4/10; 25/80    Time  8    Period  Weeks    Status  Partially Met    Target Date  09/10/17      Additional Long Term Goals   Additional Long Term Goals  Yes      PT LONG TERM GOAL #6   Title  Patient will walk through a door involving one small step up/down to increase functional independence.     Baseline  require Min A and BUE support in // bars at this time    Time  8    Period  Weeks    Status  Partially Met      PT LONG TERM GOAL #7   Title  Patient will ambulate 40 ft with least assistive device and Supervision with no breaks to allow for increased mobility within home.     Baseline  requires use of walker and CGA    Time  8    Period  Weeks    Status  Partially Met    Target Date  09/10/17      PT LONG TERM GOAL #8   Title  Patient will perform 10 MWT in < 1 minute to demonstrate improved ambulatory velocity and capacity.     Baseline  4/10: 2 minutes 45 seconds    Time  8    Period  Weeks    Status  New    Target Date  09/10/17            Plan - 08/06/17 1512    Clinical Impression Statement  Patient demonstrated improved fluidity of movement and coordination of step ups onto 4" step indicating improved strength and coordination. Patient will continue to benefit from skilled physical therapy to improve strength balance and gait safety    Rehab Potential  Fair    Clinical Impairments Affecting Rehab Potential  hx of HTN, HLD, CVA, learning disability, Diabetes, brain tumor,     PT Frequency  2x / week    PT Duration  8 weeks    PT Treatment/Interventions  ADLs/Self Care Home Management;Aquatic Therapy;Ultrasound;Moist Heat;Traction;DME Instruction;Gait training;Stair training;Functional mobility training;Therapeutic activities;Therapeutic exercise;Orthotic Fit/Training;Neuromuscular re-education;Balance training;Patient/family education;Manual techniques;Wheelchair mobility training;Passive range of motion;Energy  conservation;Taping;Visual/perceptual remediation/compensation    PT Next Visit Plan  floor recovery     PT Home Exercise Plan  see sheet    Consulted and Agree with Plan of Care  Patient;Family member/caregiver       Patient will benefit from skilled therapeutic intervention in order to improve the following deficits and impairments:  Abnormal gait, Decreased activity tolerance, Decreased balance, Decreased knowledge of precautions, Decreased endurance, Decreased coordination, Decreased knowledge of use of DME, Decreased mobility, Decreased range of motion, Difficulty walking, Decreased safety awareness, Decreased strength, Impaired flexibility, Impaired perceived functional ability, Impaired tone, Postural dysfunction, Improper body mechanics, Pain  Visit Diagnosis: Muscle weakness (generalized)  Other lack of coordination  Hemiplegia and hemiparesis following cerebral infarction affecting right dominant side (HCC)  Other abnormalities of gait and mobility  Problem List Patient Active Problem List   Diagnosis Date Noted  . Acute CVA (cerebrovascular accident) ([place]) 05/02/2016  . Left-sided weakness 05/01/2016  Janna Arch, PT, DPT   08/06/2017, 3:15 PM  [facility] MAIN [facility] SERVICES [address] [place], [place], [address] Phone: [phone]   Fax:  [phone]  Name: GARRON ELINE MRN: [id number] Date of Birth: 03/29/1966

## 2017-08-06 NOTE — Therapy (Signed)
[place] MAIN [facility] SERVICES [address] [place], [place], [address] Phone: [phone]   Fax:  [phone]  Occupational Therapy Treatment  Patient Details  Name: Curtis Powell MRN: [id number] Date of Birth: July 11, 1965 Referring Provider: Dr. Larinda Buttery   Encounter Date: 08/06/2017  OT End of Session - 08/06/17 1359    Visit Number  30    Number of Visits  36    Date for OT Re-Evaluation  10/15/17    Authorization Type  Vist 3 of 10 for progress reporting period starting 07/28/2017    OT Start Time  1345    OT Stop Time  1430    OT Time Calculation (min)  45 min    Activity Tolerance  Patient tolerated treatment well    Behavior During Therapy  [facility] for tasks assessed/performed       Past Medical History:  Diagnosis Date  . GERD (gastroesophageal reflux disease)   . Hyperlipidemia   . Hypertension   . Obesity   . Stroke [facility])     Past Surgical History:  Procedure Laterality Date  . BRAIN SURGERY      There were no vitals filed for this visit.  Subjective Assessment - 08/06/17 1351    Subjective   Pt. reports his RUE is feeling better.    Patient is accompained by:  Family member    Pertinent History  Pt. is a 52 y.o. male who suffered a CVA on 05/01/2016. Pt. was admitted to the hospital. Once discharged, he received Home Health PT and OT services for about a month. Pt. has had multiple CVAs over the past 8 years, and has had multiple falls in the past 6 months. Pt. resides in an apartment. Pt. has caregivers for 80 hours. Pt.'s mother stays with pt. at night, and assists with IADL tasks.      Currently in Pain?  No/denies      OT TREATMENT    Therapeutic Exercise:  Pt. tolerated AAROM/AROM for the right shoulder flexion abduction, adduction, horizontal abduction. Pt.worked on UE strengthening using red theraband for left shoulder flexion, abduction, bilateral elbow flexion, and extension. Pt. performed gross  gripping with grip strengthener. Pt. worked on sustaining grip while grasping pegs and reaching to place pegs in a container at the tabletop.The gripper was placed in the 3rd resistive slot with the white resistive spring for BUEs.                              OT Education - 08/06/17 1359    Education provided  Yes    Education Details  UE strengthening, and coordination.    Person(s) Educated  Patient    Methods  Explanation;Demonstration;Verbal cues    Comprehension  Returned demonstration       OT Short Term Goals - 03/03/17 1459      OT SHORT TERM GOAL #1   Title  `        OT Long Term Goals - 07/23/17 1421      OT LONG TERM GOAL #2   Title  Pt. will complete self grooming with minA    Baseline  Pt. continues to require extensive assist. Pt. is improving indedependent with oral care with set-up, however requires extensive assist for self-grooming tasls.    Time  12    Period  Weeks    Status  On-going    Target Date  10/15/17      OT LONG TERM GOAL #3   Title  Pt. will perform self-feeding skills with modified indepndence    Baseline   07/23/2017: Pt. is able to sccop, and spear the food off of the plate, however requires assist for the hand to mouth pattern. Pt. no longer needs to use built-up foam handle for utensils.    Time  12    Period  Weeks    Status  New    Target Date  10/15/17      OT LONG TERM GOAL #4   Title  Pt. will perform self dressing with minA and A/E as needed.    Baseline  07/23/2017: Pt. continues to require extensive assist: Pt. continues to have difficulty perfroming UE,a nd LE dressing.    Time  12    Period  Weeks    Status  On-going    Target Date  10/15/17      OT LONG TERM GOAL #5   Title  Pt. will perform light home making tasks with minA    Baseline  07/23/2017: Pt. continues to have difficulty with folding laundry, washing dishes while standing at the sinkside. Pt. is able to make a sandwich, and heat things  up in th microwave.    Time  12    Period  Weeks    Status  On-going    Target Date  10/15/17      OT LONG TERM GOAL #6   Title  Pt. will write his name efficiently with 100% legibility    Baseline  07/23/2017: 60% legibility for name.    Time  12    Period  Weeks    Status  On-going    Target Date  10/15/17            Plan - 08/06/17 1400    Clinical Impression Statement Pt. continues to present with limited UE strength, and coordination skills. Pt. tolerated LUE strengthening, and coordination skills. Pt. conitnues to present with limited shoulder ROM. Pain has improved. Pt. continues to work on improving UE functioning for improved engagement in  ADL, and IADL tasks.   Occupational performance deficits (Please refer to evaluation for details):  ADL's;IADL's    Rehab Potential  Fair    OT Frequency  2x / week    OT Duration  12 weeks    OT Treatment/Interventions  Self-care/ADL training;Patient/family education;DME and/or AE instruction;Therapeutic exercise;Moist Heat;Neuromuscular education    Clinical Decision Making  Several treatment options, min-mod task modification necessary    Consulted and Agree with Plan of Care  Patient       Patient will benefit from skilled therapeutic intervention in order to improve the following deficits and impairments:  Pain, Decreased cognition, Decreased activity tolerance, Impaired UE functional use, Decreased range of motion, Decreased coordination, Decreased endurance  Visit Diagnosis: No diagnosis found.    Problem List Patient Active Problem List   Diagnosis Date Noted  . Acute CVA (cerebrovascular accident) ([place]) 05/02/2016  . Left-sided weakness 05/01/2016    Curtis Carina, MS, OTR/L 08/06/2017, 2:13 PM  [place] MAIN [facility] SERVICES [address] [place], [place], [address] Phone: [phone]   Fax:  [phone]  Name: Curtis Powell MRN: [id number] Date of Birth: 1965/08/25

## 2017-08-11 ENCOUNTER — Ambulatory Visit: Payer: Medicare HMO

## 2017-08-11 ENCOUNTER — Ambulatory Visit: Payer: Medicare HMO | Admitting: Occupational Therapy

## 2017-08-14 ENCOUNTER — Ambulatory Visit: Payer: Medicare HMO

## 2017-08-14 ENCOUNTER — Encounter: Payer: Medicare HMO | Admitting: Occupational Therapy

## 2017-08-18 ENCOUNTER — Ambulatory Visit: Payer: Medicare HMO | Admitting: Physical Therapy

## 2017-08-18 ENCOUNTER — Ambulatory Visit: Payer: Medicare HMO

## 2017-08-18 DIAGNOSIS — R2689 Other abnormalities of gait and mobility: Secondary | ICD-10-CM

## 2017-08-18 DIAGNOSIS — I69351 Hemiplegia and hemiparesis following cerebral infarction affecting right dominant side: Secondary | ICD-10-CM

## 2017-08-18 DIAGNOSIS — M6281 Muscle weakness (generalized): Secondary | ICD-10-CM | POA: Diagnosis not present

## 2017-08-18 DIAGNOSIS — R278 Other lack of coordination: Secondary | ICD-10-CM

## 2017-08-18 NOTE — Therapy (Signed)
[place] MAIN [facility] SERVICES [address] [place], [place], [address] Phone: [phone]   Fax:  [phone]  Physical Therapy Treatment  Patient Details  Name: Curtis Powell MRN: [id number] Date of Birth: [birth year] Referring Provider: Dr. Larinda Buttery   Encounter Date: 08/18/2017  PT End of Session - 08/18/17 1438    Visit Number  40    Number of Visits  48    Date for PT Re-Evaluation  09/10/17    Authorization Type  7/10 progress note    PT Start Time  1430    PT Stop Time  1515    PT Time Calculation (min)  45 min    Equipment Utilized During Treatment  Gait belt    Activity Tolerance  Patient tolerated treatment well;No increased pain    Behavior During Therapy  WFL for tasks assessed/performed       Past Medical History:  Diagnosis Date  . GERD (gastroesophageal reflux disease)   . Hyperlipidemia   . Hypertension   . Obesity   . Stroke [facility])     Past Surgical History:  Procedure Laterality Date  . BRAIN SURGERY      There were no vitals filed for this visit.  Subjective Assessment - 08/18/17 1436    Subjective  Patient missed last session due to disagreement with caregiver. Have a new caregiver now, who walks with him more often.     Pertinent History   Patient is a pleasant 52 year old male who presents to physical therapy for weakness and immobility secondary to CVA.  Had a stroke in Feb 2018. He was previously fully independent, but this stroke caused severe residual deficits, mainly on the right side as well as speech, and now he is unable to walk or perform most of his ADLs on his own. Entire right side is very weak. He still has a little difficulty with speech but his swallowing is improved to baseline. His mom had to move in with him and now is his main caregiver.     Limitations  Sitting;Lifting;Standing;Walking;House hold activities    How long can you sit comfortably?  5 minutes    How long can you  stand comfortably?  5 minutes    How long can you walk comfortably?  10 ft    Patient Stated Goals  Walk without walker, walk further with walker, get some strength in back     Currently in Pain?  No/denies       Nustep Lvl 3 ; LE only 4 minutes cues for over 60 rpm for cardiovascular      6" step up : step up , cues for up with the good down with the bad, hand placement and foot positioning. required mod verbal cueing for sequencing.   6" step toe taps 12 x each leg, BUE support; occasional RLE buckling that reduced with patient taking full breath in and out prior to moving LLE.    Airex pad balance 3 minutes seconds  Seated LAQ 10x 5 second holds RLE    Sit to stand transfer in // bars for WC.    Bathroom mobility, (STS, static standing, transfers, safe mobility) CGA, ,    Pt. response to medical necessity:  Patient will continue to benefit from skilled physical therapy to improve strength balance and gait safety  PT Education - 08/18/17 1437    Education provided  Yes    Education Details  exercise technique,     Person(s) Educated  Patient    Methods  Explanation;Demonstration;Verbal cues    Comprehension  Verbalized understanding;Returned demonstration       PT Short Term Goals - 07/16/17 1531      PT SHORT TERM GOAL #1   Title  Patient will be independent in home exercise program to improve strength/mobility for better functional independence with ADLs.    Baseline  hep compliant    Time  2    Period  Weeks    Status  Achieved      PT SHORT TERM GOAL #2   Title  Patient will require min cueing for STS transfer with CGA for increased independence with mobility.     Baseline  CGA    Time  2    Period  Weeks    Status  Achieved      PT SHORT TERM GOAL #3   Title  Patient will maintain upright posture for > 15 seconds to demonstrate strengthened postural control muscualture     Baseline  18 seconds     Time  2     Period  Weeks    Status  Achieved      PT SHORT TERM GOAL #4   Title  Patient will perform         PT Long Term Goals - 07/16/17 1434      PT LONG TERM GOAL #1   Title  Patient will perform bed mobility with Supervision to increase independent mobility    Baseline  able to perform mod I for increased time , occasional Min A for scooting up in bed    Time  8    Period  Weeks    Status  Partially Met      PT LONG TERM GOAL #2   Title  Patient (< 30 years old) will complete five times sit to stand test in < 10 seconds indicating an increased LE strength and improved balance.    Baseline  2/18: 30 seconds from plinth chair height single UE on seat and single on walker ; 32 seconds from plinth with single UE on seat and single on walker. 4/10: 26 seconds from [place] with one hand on walker    Time  8    Period  Weeks    Status  Partially Met      PT LONG TERM GOAL #3   Title  Patient will ambulate 40 ft with walker with no breaks to allow for increased mobility within home.     Baseline  12/26: 29 ft with walker and WC follow with no rest breaks; 2/18: multiple trials previous session of >100 ft     Time  8    Period  Weeks    Status  Achieved      PT LONG TERM GOAL #4   Title  Patient will increase BLE gross strength to 4+/5 as to improve functional strength for independent gait, increased standing tolerance and increased ADL ability.    Baseline   4-/5 RLE,     Time  8    Period  Weeks    Status  Partially Met    Target Date  09/10/17      PT LONG TERM GOAL #5   Title  Patient will increase lower extremity functional scale to >40/80 to demonstrate improved functional mobility and  increased tolerance with ADLs.     Baseline  14/80; 12/26: 21/80; 2/18: 28/80  4/10; 25/80    Time  8    Period  Weeks    Status  Partially Met    Target Date  09/10/17      Additional Long Term Goals   Additional Long Term Goals  Yes      PT LONG TERM GOAL #6   Title  Patient will walk  through a door involving one small step up/down to increase functional independence.     Baseline  require Min A and BUE support in // bars at this time    Time  8    Period  Weeks    Status  Partially Met      PT LONG TERM GOAL #7   Title  Patient will ambulate 40 ft with least assistive device and Supervision with no breaks to allow for increased mobility within home.     Baseline  requires use of walker and CGA    Time  8    Period  Weeks    Status  Partially Met    Target Date  09/10/17      PT LONG TERM GOAL #8   Title  Patient will perform 10 MWT in < 1 minute to demonstrate improved ambulatory velocity and capacity.     Baseline  4/10: 2 minutes 45 seconds    Time  8    Period  Weeks    Status  New    Target Date  09/10/17            Plan - 08/18/17 1509    Clinical Impression Statement  Patient had decreased episodes of Right knee buckling occurred when patient focused on stabilizing leg prior to lifting left leg for step negotiation. Requires mod verbal cueing for sequencing of stair negotiation. Patient will continue to benefit from skilled physical therapy to improve strength balance and gait safety    Rehab Potential  Fair    Clinical Impairments Affecting Rehab Potential  hx of HTN, HLD, CVA, learning disability, Diabetes, brain tumor,     PT Frequency  2x / week    PT Duration  8 weeks    PT Treatment/Interventions  ADLs/Self Care Home Management;Aquatic Therapy;Ultrasound;Moist Heat;Traction;DME Instruction;Gait training;Stair training;Functional mobility training;Therapeutic activities;Therapeutic exercise;Orthotic Fit/Training;Neuromuscular re-education;Balance training;Patient/family education;Manual techniques;Wheelchair mobility training;Passive range of motion;Energy conservation;Taping;Visual/perceptual remediation/compensation    PT Next Visit Plan  floor recovery     PT Home Exercise Plan  see sheet    Consulted and Agree with Plan of Care  Patient;Family  member/caregiver       Patient will benefit from skilled therapeutic intervention in order to improve the following deficits and impairments:  Abnormal gait, Decreased activity tolerance, Decreased balance, Decreased knowledge of precautions, Decreased endurance, Decreased coordination, Decreased knowledge of use of DME, Decreased mobility, Decreased range of motion, Difficulty walking, Decreased safety awareness, Decreased strength, Impaired flexibility, Impaired perceived functional ability, Impaired tone, Postural dysfunction, Improper body mechanics, Pain  Visit Diagnosis: Muscle weakness (generalized)  Other lack of coordination  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 Acute CVA (cerebrovascular accident) ([place]) 05/02/2016  . Left-sided weakness 05/01/2016   Janna Arch, PT, DPT   08/18/2017, 3:14 PM  [place] MAIN [facility] SERVICES [address]  [place], [place], [address] Phone: [phone]   Fax:  [phone]  Name: Curtis Powell MRN: [id number] Date of Birth: 1965-08-11

## 2017-08-20 ENCOUNTER — Ambulatory Visit: Payer: Medicare HMO

## 2017-08-25 ENCOUNTER — Ambulatory Visit: Payer: Medicare HMO

## 2017-08-27 ENCOUNTER — Ambulatory Visit: Payer: Medicare HMO | Admitting: Physical Therapy

## 2017-08-27 ENCOUNTER — Encounter: Payer: Self-pay | Admitting: Physical Therapy

## 2017-08-27 DIAGNOSIS — R278 Other lack of coordination: Secondary | ICD-10-CM

## 2017-08-27 DIAGNOSIS — I69351 Hemiplegia and hemiparesis following cerebral infarction affecting right dominant side: Secondary | ICD-10-CM

## 2017-08-27 DIAGNOSIS — R2689 Other abnormalities of gait and mobility: Secondary | ICD-10-CM

## 2017-08-27 DIAGNOSIS — M6281 Muscle weakness (generalized): Secondary | ICD-10-CM | POA: Diagnosis not present

## 2017-08-27 NOTE — Therapy (Signed)
[place] MAIN [facility] SERVICES [address] [place], [place], [address] Phone: [phone]   Fax:  [phone]  Physical Therapy Treatment  Patient Details  Name: Curtis Powell MRN: [id number] Date of Birth: [birth year] Referring Provider: Dr. Larinda Buttery   Encounter Date: 08/27/2017  PT End of Session - 08/27/17 1349    Visit Number  41    Number of Visits  54    Date for PT Re-Evaluation  09/10/17    Authorization Type  8/10 progress note    PT Start Time  1348    PT Stop Time  1428    PT Time Calculation (min)  40 min    Equipment Utilized During Treatment  Gait belt    Activity Tolerance  Patient tolerated treatment well;No increased pain    Behavior During Therapy  WFL for tasks assessed/performed       Past Medical History:  Diagnosis Date  . GERD (gastroesophageal reflux disease)   . Hyperlipidemia   . Hypertension   . Obesity   . Stroke [facility])     Past Surgical History:  Procedure Laterality Date  . BRAIN SURGERY      There were no vitals filed for this visit.  Subjective Assessment - 08/27/17 1353    Subjective  Pt reports he has been feeling stiff when he wakes up.  Reports he has been performing his HEP before he goes to bed.     Pertinent History   Patient is a pleasant 52 year old male who presents to physical therapy for weakness and immobility secondary to CVA.  Had a stroke in Feb 2018. He was previously fully independent, but this stroke caused severe residual deficits, mainly on the right side as well as speech, and now he is unable to walk or perform most of his ADLs on his own. Entire right side is very weak. He still has a little difficulty with speech but his swallowing is improved to baseline. His mom had to move in with him and now is his main caregiver.     Limitations  Sitting;Lifting;Standing;Walking;House hold activities    How long can you sit comfortably?  5 minutes    How long can you  stand comfortably?  5 minutes    How long can you walk comfortably?  10 ft    Patient Stated Goals  Walk without walker, walk further with walker, get some strength in back     Currently in Pain?  No/denies        TREATMENT  Vitals taken at start of session in sitting: BP 127/68, SpO2 100%, pulse 78   6" step up x12: step up , cues for up with the good down with the bad, hand placement and foot positioning.?R knee buckle occasionally but pt able to correct with adjusting posture.   Seated reaching overhead at moving target x10 each side outside of BOS to work on lengthening spine   6" step toe taps 20x each leg, BUE support. Fatigue note during last 5 reps.   In standing with 1UE support while rotating trunk, alternating leading with each UE/elbow.   In standing, reaching up for post it overhead x10 each UE   Seated LAQ 10x 5 second holds RLE   Marching in sitting x10 each LE                         PT Education - 08/27/17 1349  Education provided  Yes    Education Details  Exercise technique; encouraged pt to stretch after his strengthening exercise before going to bed    Person(s) Educated  Patient    Methods  Explanation;Demonstration;Verbal cues    Comprehension  Verbalized understanding;Returned demonstration;Verbal cues required;Need further instruction       PT Short Term Goals - 07/16/17 1531      PT SHORT TERM GOAL #1   Title  Patient will be independent in home exercise program to improve strength/mobility for better functional independence with ADLs.    Baseline  hep compliant    Time  2    Period  Weeks    Status  Achieved      PT SHORT TERM GOAL #2   Title  Patient will require min cueing for STS transfer with CGA for increased independence with mobility.     Baseline  CGA    Time  2    Period  Weeks    Status  Achieved      PT SHORT TERM GOAL #3   Title  Patient will maintain upright posture for > 15 seconds to demonstrate  strengthened postural control muscualture     Baseline  18 seconds     Time  2    Period  Weeks    Status  Achieved      PT SHORT TERM GOAL #4   Title  Patient will perform         PT Long Term Goals - 07/16/17 1434      PT LONG TERM GOAL #1   Title  Patient will perform bed mobility with Supervision to increase independent mobility    Baseline  able to perform mod I for increased time , occasional Min A for scooting up in bed    Time  8    Period  Weeks    Status  Partially Met      PT LONG TERM GOAL #2   Title  Patient (< 85 years old) will complete five times sit to stand test in < 10 seconds indicating an increased LE strength and improved balance.    Baseline  2/18: 30 seconds from plinth chair height single UE on seat and single on walker ; 32 seconds from plinth with single UE on seat and single on walker. 4/10: 26 seconds from [place] with one hand on walker    Time  8    Period  Weeks    Status  Partially Met      PT LONG TERM GOAL #3   Title  Patient will ambulate 40 ft with walker with no breaks to allow for increased mobility within home.     Baseline  12/26: 29 ft with walker and WC follow with no rest breaks; 2/18: multiple trials previous session of >100 ft     Time  8    Period  Weeks    Status  Achieved      PT LONG TERM GOAL #4   Title  Patient will increase BLE gross strength to 4+/5 as to improve functional strength for independent gait, increased standing tolerance and increased ADL ability.    Baseline   4-/5 RLE,     Time  8    Period  Weeks    Status  Partially Met    Target Date  09/10/17      PT LONG TERM GOAL #5   Title  Patient will increase lower extremity functional scale to >  40/80 to demonstrate improved functional mobility and increased tolerance with ADLs.     Baseline  14/80; 12/26: 21/80; 2/18: 28/80  4/10; 25/80    Time  8    Period  Weeks    Status  Partially Met    Target Date  09/10/17      Additional Long Term Goals   Additional  Long Term Goals  Yes      PT LONG TERM GOAL #6   Title  Patient will walk through a door involving one small step up/down to increase functional independence.     Baseline  require Min A and BUE support in // bars at this time    Time  8    Period  Weeks    Status  Partially Met      PT LONG TERM GOAL #7   Title  Patient will ambulate 40 ft with least assistive device and Supervision with no breaks to allow for increased mobility within home.     Baseline  requires use of walker and CGA    Time  8    Period  Weeks    Status  Partially Met    Target Date  09/10/17      PT LONG TERM GOAL #8   Title  Patient will perform 10 MWT in < 1 minute to demonstrate improved ambulatory velocity and capacity.     Baseline  4/10: 2 minutes 45 seconds    Time  8    Period  Weeks    Status  New    Target Date  09/10/17            Plan - 08/27/17 1354    Clinical Impression Statement  Pt was able to perform step up to 6" step x12 consecutively with occasional R knee buckle but pt able to correct by adjusting posture.  Pt improved his endurance with toe taps as well, completing 20x each LE consecutively before requiring a seated rest break.  Introduced reaching overhead activity in an effort to promote upright posture and functional lengthening of spine.  Pt will benefit from continued skilled PT interventions for improved strength and balance.     Rehab Potential  Fair    Clinical Impairments Affecting Rehab Potential  hx of HTN, HLD, CVA, learning disability, Diabetes, brain tumor,     PT Frequency  2x / week    PT Duration  8 weeks    PT Treatment/Interventions  ADLs/Self Care Home Management;Aquatic Therapy;Ultrasound;Moist Heat;Traction;DME Instruction;Gait training;Stair training;Functional mobility training;Therapeutic activities;Therapeutic exercise;Orthotic Fit/Training;Neuromuscular re-education;Balance training;Patient/family education;Manual techniques;Wheelchair mobility  training;Passive range of motion;Energy conservation;Taping;Visual/perceptual remediation/compensation    PT Next Visit Plan  floor recovery     PT Home Exercise Plan  see sheet    Consulted and Agree with Plan of Care  Patient;Family member/caregiver       Patient will benefit from skilled therapeutic intervention in order to improve the following deficits and impairments:  Abnormal gait, Decreased activity tolerance, Decreased balance, Decreased knowledge of precautions, Decreased endurance, Decreased coordination, Decreased knowledge of use of DME, Decreased mobility, Decreased range of motion, Difficulty walking, Decreased safety awareness, Decreased strength, Impaired flexibility, Impaired perceived functional ability, Impaired tone, Postural dysfunction, Improper body mechanics, Pain  Visit Diagnosis: Muscle weakness (generalized)  Other lack of coordination  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  Acute CVA (cerebrovascular accident) ([place]) 05/02/2016  . Left-sided weakness 05/01/2016    Collie Siad PT, DPT 08/27/2017, 2:25 PM  [place] MAIN [facility] SERVICES [address] [place], [place], [address] Phone: [phone]   Fax:  [phone]  Name: Curtis Powell MRN: [id number] Date of Birth: [birth year]

## 2017-08-28 ENCOUNTER — Ambulatory Visit: Payer: Medicare HMO

## 2017-08-28 ENCOUNTER — Encounter: Payer: Self-pay | Admitting: Occupational Therapy

## 2017-08-28 ENCOUNTER — Ambulatory Visit: Payer: Medicare HMO | Admitting: Occupational Therapy

## 2017-08-28 DIAGNOSIS — R2689 Other abnormalities of gait and mobility: Secondary | ICD-10-CM

## 2017-08-28 DIAGNOSIS — R278 Other lack of coordination: Secondary | ICD-10-CM

## 2017-08-28 DIAGNOSIS — M6281 Muscle weakness (generalized): Secondary | ICD-10-CM

## 2017-08-28 DIAGNOSIS — I69351 Hemiplegia and hemiparesis following cerebral infarction affecting right dominant side: Secondary | ICD-10-CM

## 2017-08-28 NOTE — Therapy (Signed)
[place] MAIN [facility] SERVICES [address] [place], [place], [address] Phone: [phone]   Fax:  [phone]  Occupational Therapy Treatment  Patient Details  Name: Curtis Powell MRN: [id number] Date of Birth: Oct 18, 1965 Referring Provider: Dr. Larinda Buttery   Encounter Date: 08/28/2017  OT End of Session - 08/28/17 1440    Visit Number  31    Number of Visits  36    Date for OT Re-Evaluation  10/15/17    Authorization Type  Vist 4 of 10 for progress reporting period starting 07/28/2017    OT Start Time  1433    OT Stop Time  1515    OT Time Calculation (min)  42 min    Activity Tolerance  Patient tolerated treatment well    Behavior During Therapy  [facility] for tasks assessed/performed       Past Medical History:  Diagnosis Date  . GERD (gastroesophageal reflux disease)   . Hyperlipidemia   . Hypertension   . Obesity   . Stroke [facility])     Past Surgical History:  Procedure Laterality Date  . BRAIN SURGERY      There were no vitals filed for this visit.  Subjective Assessment - 08/28/17 1439    Subjective   Pt. reports he has been working with his arm at home.    Patient is accompained by:  Family member    Pertinent History  Pt. is a 52 y.o. male who suffered a CVA on 05/01/2016. Pt. was admitted to the hospital. Once discharged, he received Home Health PT and OT services for about a month. Pt. has had multiple CVAs over the past 8 years, and has had multiple falls in the past 6 months. Pt. resides in an apartment. Pt. has caregivers for 80 hours. Pt.'s mother stays with pt. at night, and assists with IADL tasks.      Currently in Pain?  No/denies      OT TREATMENT    Therapeutic Exercise:  Pt.tolerated AAROM/AROM for the right shoulder flexion abduction, adduction, horizontal abduction. Pt.worked on UE strengthening using red theraband for left shoulder flexion, abduction, bilateral elbow flexion, and  extension.  Neuromuscular re-education:   Pt. worked on grasping 1/8" pegs with using long nosed tweezers, and placing them on a small pegboard. Pt. Worked on removing the pegs while grasping with the tip of her 2nd digit, and thumb.                          OT Education - 08/28/17 1440    Education provided  Yes    Education Details  UE ther. ex    Person(s) Educated  Patient    Methods  Explanation;Demonstration;Verbal cues    Comprehension  Verbalized understanding;Returned demonstration       OT Short Term Goals - 03/03/17 1459      OT SHORT TERM GOAL #1   Title  `        OT Long Term Goals - 07/23/17 1421      OT LONG TERM GOAL #2   Title  Pt. will complete self grooming with minA    Baseline  Pt. continues to require extensive assist. Pt. is improving indedependent with oral care with set-up, however requires extensive assist for self-grooming tasls.    Time  12    Period  Weeks    Status  On-going    Target Date  10/15/17  OT LONG TERM GOAL #3   Title  Pt. will perform self-feeding skills with modified indepndence    Baseline   07/23/2017: Pt. is able to sccop, and spear the food off of the plate, however requires assist for the hand to mouth pattern. Pt. no longer needs to use built-up foam handle for utensils.    Time  12    Period  Weeks    Status  New    Target Date  10/15/17      OT LONG TERM GOAL #4   Title  Pt. will perform self dressing with minA and A/E as needed.    Baseline  07/23/2017: Pt. continues to require extensive assist: Pt. continues to have difficulty perfroming UE,a nd LE dressing.    Time  12    Period  Weeks    Status  On-going    Target Date  10/15/17      OT LONG TERM GOAL #5   Title  Pt. will perform light home making tasks with minA    Baseline  07/23/2017: Pt. continues to have difficulty with folding laundry, washing dishes while standing at the sinkside. Pt. is able to make a sandwich, and heat things up  in th microwave.    Time  12    Period  Weeks    Status  On-going    Target Date  10/15/17      OT LONG TERM GOAL #6   Title  Pt. will write his name efficiently with 100% legibility    Baseline  07/23/2017: 60% legibility for name.    Time  12    Period  Weeks    Status  On-going    Target Date  10/15/17            Plan - 08/28/17 1441    Clinical Impression Statement Pt. reports he has not been able to attend secondary to transportation issues. Pt. reports his transportation is through [employer]. Pt. reports there is only one van, and sometimes they are not able to pick them up. Pt. continues to present with limited UE strength, and coordination skills. Pt. tolerated LUE strengthening, and coordination skills. Pt. continues to present with limited shoulder ROM. Pain has improved. Pt. continues to work on improving UE functioning for improved ADL, and IADL functioning.     Occupational performance deficits (Please refer to evaluation for details):  ADL's;IADL's    Rehab Potential  Fair    OT Frequency  2x / week    OT Duration  12 weeks    OT Treatment/Interventions  Self-care/ADL training;Patient/family education;DME and/or AE instruction;Therapeutic exercise;Moist Heat;Neuromuscular education    Clinical Decision Making  Several treatment options, min-mod task modification necessary    Consulted and Agree with Plan of Care  Patient       Patient will benefit from skilled therapeutic intervention in order to improve the following deficits and impairments:  Pain, Decreased cognition, Decreased activity tolerance, Impaired UE functional use, Decreased range of motion, Decreased coordination, Decreased endurance  Visit Diagnosis: Muscle weakness (generalized)  Other lack of coordination    Problem List Patient Active Problem List   Diagnosis Date Noted  . Acute CVA (cerebrovascular accident) ([place]) 05/02/2016  . Left-sided weakness 05/01/2016    Harrel Carina, MS,  OTR/L 08/28/2017, 2:56 PM  [place] MAIN [facility] SERVICES [address] [place], [place], [address] Phone: [phone]   Fax:  [phone]  Name: Curtis Powell MRN: [id number] Date of  Birth: 10-07-1965

## 2017-08-28 NOTE — Therapy (Signed)
[place] MAIN [facility] SERVICES [address] [place], [place], [address] Phone: [phone]   Fax:  [phone]  Physical Therapy Treatment  Patient Details  Name: JAVARIS WIGINGTON MRN: [id number] Date of Birth: 1965-10-23 Referring Provider: Dr. Larinda Buttery   Encounter Date: 08/28/2017  PT End of Session - 08/28/17 1329    Visit Number  42    Number of Visits  65    Date for PT Re-Evaluation  09/10/17    Authorization Type  9/10 progress note    PT Start Time  1313    PT Stop Time  1345    PT Time Calculation (min)  32 min    Equipment Utilized During Treatment  Gait belt    Activity Tolerance  Patient tolerated treatment well;No increased pain    Behavior During Therapy  WFL for tasks assessed/performed       Past Medical History:  Diagnosis Date  . GERD (gastroesophageal reflux disease)   . Hyperlipidemia   . Hypertension   . Obesity   . Stroke [facility])     Past Surgical History:  Procedure Laterality Date  . BRAIN SURGERY      There were no vitals filed for this visit.  Subjective Assessment - 08/28/17 1322    Subjective  Patient drank more water yesterday and reports feeling better today,not as stiff. Reports being constipated.     Pertinent History   Patient is a pleasant 52 year old male who presents to physical therapy for weakness and immobility secondary to CVA.  Had a stroke in Feb 2018. He was previously fully independent, but this stroke caused severe residual deficits, mainly on the right side as well as speech, and now he is unable to walk or perform most of his ADLs on his own. Entire right side is very weak. He still has a little difficulty with speech but his swallowing is improved to baseline. His mom had to move in with him and now is his main caregiver.     Limitations  Sitting;Lifting;Standing;Walking;House hold activities    How long can you sit comfortably?  5 minutes    How long can you stand  comfortably?  5 minutes    How long can you walk comfortably?  10 ft    Patient Stated Goals  Walk without walker, walk further with walker, get some strength in back     Currently in Pain?  No/denies         Seated reaching overhead at moving target x10 each side outside of BOS to work on lengthening spine   6" step toe taps 20x each leg, BUE support. Fatigue note during last 5 reps.   Standing reach for [place] balls bringing balls from blue to green back to blue rungs x 2 trials each arm.   Seated LAQ 10x 5 second holds RLE   Marching in sitting x10 each LE   Step over and back half foam roller 10x each leg.   Standing hip extension 10x each leg BUE support    Pt. response to medical necessity: Patient will continue to benefit from skilled physical therapy for improved strength and balance.                         PT Education - 08/28/17 1329    Education provided  Yes    Education Details  exercise technique     [name]) Educated  Patient  Methods  Explanation;Demonstration;Verbal cues    Comprehension  Verbalized understanding;Returned demonstration       PT Short Term Goals - 07/16/17 1531      PT SHORT TERM GOAL #1   Title  Patient will be independent in home exercise program to improve strength/mobility for better functional independence with ADLs.    Baseline  hep compliant    Time  2    Period  Weeks    Status  Achieved      PT SHORT TERM GOAL #2   Title  Patient will require min cueing for STS transfer with CGA for increased independence with mobility.     Baseline  CGA    Time  2    Period  Weeks    Status  Achieved      PT SHORT TERM GOAL #3   Title  Patient will maintain upright posture for > 15 seconds to demonstrate strengthened postural control muscualture     Baseline  18 seconds     Time  2    Period  Weeks    Status  Achieved      PT SHORT TERM GOAL #4   Title  Patient will perform         PT Long Term  Goals - 07/16/17 1434      PT LONG TERM GOAL #1   Title  Patient will perform bed mobility with Supervision to increase independent mobility    Baseline  able to perform mod I for increased time , occasional Min A for scooting up in bed    Time  8    Period  Weeks    Status  Partially Met      PT LONG TERM GOAL #2   Title  Patient (< 68 years old) will complete five times sit to stand test in < 10 seconds indicating an increased LE strength and improved balance.    Baseline  2/18: 30 seconds from plinth chair height single UE on seat and single on walker ; 32 seconds from plinth with single UE on seat and single on walker. 4/10: 26 seconds from [place] with one hand on walker    Time  8    Period  Weeks    Status  Partially Met      PT LONG TERM GOAL #3   Title  Patient will ambulate 40 ft with walker with no breaks to allow for increased mobility within home.     Baseline  12/26: 29 ft with walker and WC follow with no rest breaks; 2/18: multiple trials previous session of >100 ft     Time  8    Period  Weeks    Status  Achieved      PT LONG TERM GOAL #4   Title  Patient will increase BLE gross strength to 4+/5 as to improve functional strength for independent gait, increased standing tolerance and increased ADL ability.    Baseline   4-/5 RLE,     Time  8    Period  Weeks    Status  Partially Met    Target Date  09/10/17      PT LONG TERM GOAL #5   Title  Patient will increase lower extremity functional scale to >40/80 to demonstrate improved functional mobility and increased tolerance with ADLs.     Baseline  14/80; 12/26: 21/80; 2/18: 28/80  4/10; 25/80    Time  8    Period  Weeks  Status  Partially Met    Target Date  09/10/17      Additional Long Term Goals   Additional Long Term Goals  Yes      PT LONG TERM GOAL #6   Title  Patient will walk through a door involving one small step up/down to increase functional independence.     Baseline  require Min A and BUE support  in // bars at this time    Time  8    Period  Weeks    Status  Partially Met      PT LONG TERM GOAL #7   Title  Patient will ambulate 40 ft with least assistive device and Supervision with no breaks to allow for increased mobility within home.     Baseline  requires use of walker and CGA    Time  8    Period  Weeks    Status  Partially Met    Target Date  09/10/17      PT LONG TERM GOAL #8   Title  Patient will perform 10 MWT in < 1 minute to demonstrate improved ambulatory velocity and capacity.     Baseline  4/10: 2 minutes 45 seconds    Time  8    Period  Weeks    Status  New    Target Date  09/10/17            Plan - 08/28/17 1338    Clinical Impression Statement  Patient presents with buckling of R knee with fatigue during 6" step ups, required verbal cues for setting leg prior to stepping up. Patient educated on need for quadriceps strengthening to reduce buckling. Patient will continue to benefit from skilled physical therapy for improved strength and balance.     Rehab Potential  Fair    Clinical Impairments Affecting Rehab Potential  hx of HTN, HLD, CVA, learning disability, Diabetes, brain tumor,     PT Frequency  2x / week    PT Duration  8 weeks    PT Treatment/Interventions  ADLs/Self Care Home Management;Aquatic Therapy;Ultrasound;Moist Heat;Traction;DME Instruction;Gait training;Stair training;Functional mobility training;Therapeutic activities;Therapeutic exercise;Orthotic Fit/Training;Neuromuscular re-education;Balance training;Patient/family education;Manual techniques;Wheelchair mobility training;Passive range of motion;Energy conservation;Taping;Visual/perceptual remediation/compensation    PT Next Visit Plan  floor recovery     PT Home Exercise Plan  see sheet    Consulted and Agree with Plan of Care  Patient;Family member/caregiver       Patient will benefit from skilled therapeutic intervention in order to improve the following deficits and impairments:   Abnormal gait, Decreased activity tolerance, Decreased balance, Decreased knowledge of precautions, Decreased endurance, Decreased coordination, Decreased knowledge of use of DME, Decreased mobility, Decreased range of motion, Difficulty walking, Decreased safety awareness, Decreased strength, Impaired flexibility, Impaired perceived functional ability, Impaired tone, Postural dysfunction, Improper body mechanics, Pain  Visit Diagnosis: Muscle weakness (generalized)  Other lack of coordination  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 Acute CVA (cerebrovascular accident) ([place]) 05/02/2016  . Left-sided weakness 05/01/2016   Janna Arch, PT, DPT   08/28/2017, 1:45 PM  [place] MAIN [facility] SERVICES [address] [place], [place], [address] Phone: [phone]   Fax:  [phone]  Name: RAHIM ASTORGA MRN: [id number] Date of Birth: 02-Sep-1965

## 2017-09-03 ENCOUNTER — Ambulatory Visit: Payer: Medicare HMO

## 2017-09-03 DIAGNOSIS — R2689 Other abnormalities of gait and mobility: Secondary | ICD-10-CM

## 2017-09-03 DIAGNOSIS — R278 Other lack of coordination: Secondary | ICD-10-CM

## 2017-09-03 DIAGNOSIS — M6281 Muscle weakness (generalized): Secondary | ICD-10-CM

## 2017-09-03 DIAGNOSIS — I69351 Hemiplegia and hemiparesis following cerebral infarction affecting right dominant side: Secondary | ICD-10-CM

## 2017-09-03 NOTE — Therapy (Signed)
[place] MAIN [facility] SERVICES [address] [place], [place], [address] Phone: [phone]   Fax:  [phone]  Physical Therapy Treatment  Patient Details  Name: Curtis Powell MRN: [id number] Date of Birth: 1966/03/28 Referring Provider: Dr. Larinda Buttery   Encounter Date: 09/03/2017  PT End of Session - 09/03/17 1748    Visit Number  43    Number of Visits  68    Date for PT Re-Evaluation  10/29/17    Authorization Type  10/10 progress note; next is 1/10     PT Start Time  1430    PT Stop Time  1514    PT Time Calculation (min)  44 min    Equipment Utilized During Treatment  Gait belt    Activity Tolerance  Patient tolerated treatment well;No increased pain    Behavior During Therapy  WFL for tasks assessed/performed       Past Medical History:  Diagnosis Date  . GERD (gastroesophageal reflux disease)   . Hyperlipidemia   . Hypertension   . Obesity   . Stroke [facility])     Past Surgical History:  Procedure Laterality Date  . BRAIN SURGERY      There were no vitals filed for this visit.  Physical Therapy Progress Note   Dates of reporting period  09/03/17 to  10/29/17   Subjective Assessment - 09/03/17 1434    Subjective  Patient reports having difficulty with caregiver/nurse aid today. Reports not walking outside due to heat. Reports compliance with HEP. no concerns at this time     Pertinent History   Patient is a pleasant 52 year old male who presents to physical therapy for weakness and immobility secondary to CVA.  Had a stroke in Feb 2018. He was previously fully independent, but this stroke caused severe residual deficits, mainly on the right side as well as speech, and now he is unable to walk or perform most of his ADLs on his own. Entire right side is very weak. He still has a little difficulty with speech but his swallowing is improved to baseline. His mom had to move in with him and now is his main caregiver.      Limitations  Sitting;Lifting;Standing;Walking;House hold activities    How long can you sit comfortably?  5 minutes    How long can you stand comfortably?  5 minutes    How long can you walk comfortably?  10 ft    Patient Stated Goals  Walk without walker, walk further with walker, get some strength in back     Currently in Pain?  No/denies     5x STS: 22.2 seconds   BLE strength  R hamstring 3+//5 , all else 4-/5  LEFS=27/80 Climbing steps at home.   Walk through a door with a small step  10 MWT: 2 minutes 31 seconds    Seated reaching overhead at moving target x10 each side outside of BOS to work on lengthening spine    6" step up down 4" step up down consecutively in // bars 4x length of bars.  BUE support. Up with LLE, down with RLE, fatigue at end of set required seated rest break     Pt. response to medical necessity: Patient will continue to benefit from skilled physical therapy for improved strength and balance.   Patient's condition has the potential to improve in response to therapy. Maximum improvement is yet to be obtained. The anticipated improvement is attainable  and reasonable in a generally predictable time. Start date of reporting period 09/03/17 end date of reporting period 10/31/17. Patient reports improved mobility in home environment.                           PT Education - 09/03/17 1747    Education provided  Yes    Education Details  goal progression, HEP compliance, exercise technique     Person(s) Educated  Patient    Methods  Explanation;Demonstration;Verbal cues    Comprehension  Verbalized understanding;Returned demonstration       PT Short Term Goals - 07/16/17 1531      PT SHORT TERM GOAL #1   Title  Patient will be independent in home exercise program to improve strength/mobility for better functional independence with ADLs.    Baseline  hep compliant    Time  2    Period  Weeks    Status  Achieved      PT SHORT  TERM GOAL #2   Title  Patient will require min cueing for STS transfer with CGA for increased independence with mobility.     Baseline  CGA    Time  2    Period  Weeks    Status  Achieved      PT SHORT TERM GOAL #3   Title  Patient will maintain upright posture for > 15 seconds to demonstrate strengthened postural control muscualture     Baseline  18 seconds     Time  2    Period  Weeks    Status  Achieved      PT SHORT TERM GOAL #4   Title  Patient will perform         PT Long Term Goals - 09/03/17 1446      PT LONG TERM GOAL #1   Title  Patient will perform bed mobility with Supervision to increase independent mobility    Baseline  able to perform mod I for increased time , occasional Min A for scooting up in bed    Time  8    Period  Weeks    Status  Partially Met    Target Date  10/29/17      PT LONG TERM GOAL #2   Title  Patient (< 50 years old) will complete five times sit to stand test in < 10 seconds indicating an increased LE strength and improved balance.    Baseline  2/18: 30 seconds from plinth chair height single UE on seat and single on walker ; 32 seconds from plinth with single UE on seat and single on walker. 4/10: 26 seconds from [facility] with one hand on walker5/29: 22.2 seconds with one hand on walker one hand on WC    Time  8    Period  Weeks    Status  Partially Met    Target Date  10/29/17      PT LONG TERM GOAL #3   Title  Patient will ambulate 40 ft with walker with no breaks to allow for increased mobility within home.     Baseline  12/26: 29 ft with walker and WC follow with no rest breaks; 2/18: multiple trials previous session of >100 ft     Time  8    Period  Weeks    Status  Achieved      PT LONG TERM GOAL #4   Title  Patient will increase BLE gross strength  to 4+/5 as to improve functional strength for independent gait, increased standing tolerance and increased ADL ability.    Baseline   4-/5 RLE,     Time  8    Period  Weeks    Status   Partially Met    Target Date  10/29/17      PT LONG TERM GOAL #5   Title  Patient will increase lower extremity functional scale to >40/80 to demonstrate improved functional mobility and increased tolerance with ADLs.     Baseline  14/80; 12/26: 21/80; 2/18: 28/80  4/10; 25/80 5/29: 27/80    Time  8    Period  Weeks    Status  Partially Met    Target Date  10/29/17      PT LONG TERM GOAL #6   Title  Patient will walk through a door involving one small step up/down to increase functional independence.     Baseline  require CGA and min Verbal cueing with BUE support in // bars at this time    Time  8    Period  Weeks    Status  Partially Met    Target Date  10/29/17      PT LONG TERM GOAL #7   Title  Patient will ambulate 40 ft with least assistive device and Supervision with no breaks to allow for increased mobility within home.     Baseline  requires use of walker     Time  8    Period  Weeks    Status  Partially Met    Target Date  10/29/17      PT LONG TERM GOAL #8   Title  Patient will perform 10 MWT in < 1 minute to demonstrate improved ambulatory velocity and capacity.     Baseline  4/10: 2 minutes 45 seconds; 5/29: 2 minute 31 seconds    Time  8    Period  Weeks    Status  Partially Met    Target Date  10/29/17            Plan - 09/03/17 1755    Clinical Impression Statement  Patient is progressing towards all goals at this time. 10MWT was performed in 2 minutes and 31 seconds with RW and WC follow. 5x STS 22.2 seconds with UE assistance, and patient is demonstrating improved negotiation of steps to simulate home environment. Occasional right knee buckling limits patient's stability when fatigued resulting in seated rest breaks.Patient's condition has the potential to improve in response to therapy. Maximum improvement is yet to be obtained. The anticipated improvement is attainable and reasonable in a generally predictable time. Start date of reporting period 09/03/17  end date of reporting period 10/31/17. Patient reports improved mobility in home environment.  Patient will continue to benefit from skilled physical therapy for improved strength and balance.     Rehab Potential  Fair    Clinical Impairments Affecting Rehab Potential  hx of HTN, HLD, CVA, learning disability, Diabetes, brain tumor,     PT Frequency  2x / week    PT Duration  8 weeks    PT Treatment/Interventions  ADLs/Self Care Home Management;Aquatic Therapy;Ultrasound;Moist Heat;Traction;DME Instruction;Gait training;Stair training;Functional mobility training;Therapeutic activities;Therapeutic exercise;Orthotic Fit/Training;Neuromuscular re-education;Balance training;Patient/family education;Manual techniques;Wheelchair mobility training;Passive range of motion;Energy conservation;Taping;Visual/perceptual remediation/compensation    PT Next Visit Plan  floor recovery     PT Home Exercise Plan  see sheet    Consulted and Agree with Plan of Care  Patient;Family member/caregiver  Patient will benefit from skilled therapeutic intervention in order to improve the following deficits and impairments:  Abnormal gait, Decreased activity tolerance, Decreased balance, Decreased knowledge of precautions, Decreased endurance, Decreased coordination, Decreased knowledge of use of DME, Decreased mobility, Decreased range of motion, Difficulty walking, Decreased safety awareness, Decreased strength, Impaired flexibility, Impaired perceived functional ability, Impaired tone, Postural dysfunction, Improper body mechanics, Pain  Visit Diagnosis: Muscle weakness (generalized)  Other lack of coordination  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 Acute CVA (cerebrovascular accident) ([place]) 05/02/2016  . Left-sided weakness 05/01/2016    Janna Arch, PT, DPT   09/03/2017, 5:58 PM  [place] MAIN [facility] SERVICES [address] [place], [place], [address] Phone: [phone]   Fax:  [phone]  Name: Curtis Powell MRN: [id number] Date of Birth: 1966/04/06

## 2017-09-08 ENCOUNTER — Ambulatory Visit: Payer: Medicare HMO

## 2017-09-08 ENCOUNTER — Ambulatory Visit: Payer: Medicare HMO | Attending: Family Medicine | Admitting: Occupational Therapy

## 2017-09-08 ENCOUNTER — Encounter: Payer: Self-pay | Admitting: Occupational Therapy

## 2017-09-08 DIAGNOSIS — R278 Other lack of coordination: Secondary | ICD-10-CM

## 2017-09-08 DIAGNOSIS — I69351 Hemiplegia and hemiparesis following cerebral infarction affecting right dominant side: Secondary | ICD-10-CM | POA: Insufficient documentation

## 2017-09-08 DIAGNOSIS — M6281 Muscle weakness (generalized): Secondary | ICD-10-CM

## 2017-09-08 DIAGNOSIS — R2689 Other abnormalities of gait and mobility: Secondary | ICD-10-CM | POA: Diagnosis present

## 2017-09-08 NOTE — Therapy (Signed)
[place] MAIN [facility] SERVICES [address] [place], [place], [address] Phone: [phone]   Fax:  [phone]  Occupational Therapy Treatment  Patient Details  Name: Curtis Powell MRN: [id number] Date of Birth: 09-Sep-1965 Referring Provider: Dr. Larinda Buttery   Encounter Date: 09/08/2017  OT End of Session - 09/08/17 1458    Visit Number  32    Number of Visits  36    Date for OT Re-Evaluation  10/15/17    Authorization Type  [place]ist 5 of 10 for progress reporting period starting 07/28/2017    OT Start Time  1452    OT Stop Time  1530    OT Time Calculation (min)  38 min    Activity Tolerance  Patient tolerated treatment well    Behavior During Therapy  [facility] for tasks assessed/performed       Past Medical History:  Diagnosis Date  . GERD (gastroesophageal reflux disease)   . Hyperlipidemia   . Hypertension   . Obesity   . Stroke [facility])     Past Surgical History:  Procedure Laterality Date  . BRAIN SURGERY      There were no vitals filed for this visit.  Subjective Assessment - 09/08/17 1457    Subjective   Pt. transportation arrived 2 hours early    Patient is accompained by:  Family member    Pertinent History  Pt. is a 52 y.o. male who suffered a CVA on 05/01/2016. Pt. was admitted to the hospital. Once discharged, he received Home Health PT and OT services for about a month. Pt. has had multiple CVAs over the past 8 years, and has had multiple falls in the past 6 months. Pt. resides in an apartment. Pt. has caregivers for 80 hours. Pt.'s mother stays with pt. at night, and assists with IADL tasks.      Currently in Pain?  No/denies      OT TREATMENT    Therapeutic Exercise:  Pt.tolerated AAROM/AROM for the right shoulder flexion abduction, adduction, horizontal abduction. Pt.worked on UE strengthening using red theraband for left shoulder flexion, abduction, bilateral elbow flexion, and  extension.                             OT Education - 09/08/17 1511    Education provided  Yes    Education Details  UE ther. ex.    Person(s) Educated  Patient    Methods  Explanation;Demonstration    Comprehension  Verbalized understanding;Returned demonstration;Verbal cues required;Need further instruction       OT Short Term Goals - 03/03/17 1459      OT SHORT TERM GOAL #1   Title  `        OT Long Term Goals - 07/23/17 1421      OT LONG TERM GOAL #2   Title  Pt. will complete self grooming with minA    Baseline  Pt. continues to require extensive assist. Pt. is improving indedependent with oral care with set-up, however requires extensive assist for self-grooming tasls.    Time  12    Period  Weeks    Status  On-going    Target Date  10/15/17      OT LONG TERM GOAL #3   Title  Pt. will perform self-feeding skills with modified indepndence    Baseline   07/23/2017: Pt. is able to sccop, and spear the food off of  the plate, however requires assist for the hand to mouth pattern. Pt. no longer needs to use built-up foam handle for utensils.    Time  12    Period  Weeks    Status  New    Target Date  10/15/17      OT LONG TERM GOAL #4   Title  Pt. will perform self dressing with minA and A/E as needed.    Baseline  07/23/2017: Pt. continues to require extensive assist: Pt. continues to have difficulty perfroming UE,a nd LE dressing.    Time  12    Period  Weeks    Status  On-going    Target Date  10/15/17      OT LONG TERM GOAL #5   Title  Pt. will perform light home making tasks with minA    Baseline  07/23/2017: Pt. continues to have difficulty with folding laundry, washing dishes while standing at the sinkside. Pt. is able to make a sandwich, and heat things up in th microwave.    Time  12    Period  Weeks    Status  On-going    Target Date  10/15/17      OT LONG TERM GOAL #6   Title  Pt. will write his name efficiently with 100%  legibility    Baseline  07/23/2017: 60% legibility for name.    Time  12    Period  Weeks    Status  On-going    Target Date  10/15/17            Plan - 09/08/17 1459    Clinical Impression Statement  Pt. is making progress overall. Pt. reports his RUE ROM, and pain have improved. Pt. is improving with functional reaching into the cabinetry, and the refrigerator. Pt. continues to present with limited UE strength, and fine motor coordination skills. Pt. continues to work on improving UE strength, and coordination skills to work towards improving ADLs, and IADLs.    Occupational performance deficits (Please refer to evaluation for details):  ADL's;IADL's    Rehab Potential  Fair    OT Frequency  2x / week    OT Duration  12 weeks    OT Treatment/Interventions  Self-care/ADL training;Patient/family education;DME and/or AE instruction;Therapeutic exercise;Moist Heat;Neuromuscular education    Clinical Decision Making  Several treatment options, min-mod task modification necessary    Consulted and Agree with Plan of Care  Patient       Patient will benefit from skilled therapeutic intervention in order to improve the following deficits and impairments:  Pain, Decreased cognition, Decreased activity tolerance, Impaired UE functional use, Decreased range of motion, Decreased coordination, Decreased endurance  Visit Diagnosis: Muscle weakness (generalized)    Problem List Patient Active Problem List   Diagnosis Date Noted  . Acute CVA (cerebrovascular accident) ([place]) 05/02/2016  . Left-sided weakness 05/01/2016    Harrel Carina 09/08/2017, 6:14 PM  [place] MAIN [facility] SERVICES [address] [place], [place], [address] Phone: [phone]   Fax:  [phone]  Name: Curtis Powell MRN: [id number] Date of Birth: [birth year]

## 2017-09-08 NOTE — Therapy (Signed)
[place] MAIN [facility] SERVICES [address] [place], [place], [address] Phone: [phone]   Fax:  [phone]  Physical Therapy Treatment  Patient Details  Name: Curtis Powell MRN: [id number] Date of Birth: 1965-10-07 Referring Provider: Dr. Larinda Buttery   Encounter Date: 09/08/2017  PT End of Session - 09/08/17 1517    Visit Number  44    Number of Visits  59    Date for PT Re-Evaluation  10/29/17    Authorization Type  1/10    PT Start Time  1521    PT Stop Time  1600    PT Time Calculation (min)  39 min    Equipment Utilized During Treatment  Gait belt    Activity Tolerance  Patient tolerated treatment well;No increased pain    Behavior During Therapy  WFL for tasks assessed/performed       Past Medical History:  Diagnosis Date  . GERD (gastroesophageal reflux disease)   . Hyperlipidemia   . Hypertension   . Obesity   . Stroke [facility])     Past Surgical History:  Procedure Laterality Date  . BRAIN SURGERY      There were no vitals filed for this visit.  Subjective Assessment - 09/08/17 1525    Subjective  Patient reports overdoing it this weekend, reports having increased soreness today. Reports no pain just soreness. Reports walking in house and was taking medication that is sensitive to sunlight.     Pertinent History   Patient is a pleasant 52 year old male who presents to physical therapy for weakness and immobility secondary to CVA.  Had a stroke in Feb 2018. He was previously fully independent, but this stroke caused severe residual deficits, mainly on the right side as well as speech, and now he is unable to walk or perform most of his ADLs on his own. Entire right side is very weak. He still has a little difficulty with speech but his swallowing is improved to baseline. His mom had to move in with him and now is his main caregiver.     Limitations  Sitting;Lifting;Standing;Walking;House hold activities    How  long can you sit comfortably?  5 minutes    How long can you stand comfortably?  5 minutes    How long can you walk comfortably?  10 ft    Patient Stated Goals  Walk without walker, walk further with walker, get some strength in back     Currently in Pain?  No/denies       Seated reaching overhead at moving target x10 each side outside of BOS to work on lengthening spine    6" step toe taps 20x each leg, BUE support. Fatigue note during last 5 reps.     Standing reach for [place] balls bringing balls from blue to green back to blue rungs x 2 trials each arm.    Seated LAQ 10x 5 second holds RLE    Marching in sitting x10 each LE    Step over and back half foam roller 10x each leg.    Standing hip extension 10x each leg BUE support   Standing hip abduction 10x each leg BUE support     Pt. response to medical necessity: Patient will continue to benefit from skilled physical therapy for improved strength and balance.  PT Education - 09/08/17 1526    Education provided  Yes    Education Details  exercise technique     Person(s) Educated  Patient    Methods  Explanation;Demonstration;Verbal cues    Comprehension  Verbalized understanding;Returned demonstration       PT Short Term Goals - 07/16/17 1531      PT SHORT TERM GOAL #1   Title  Patient will be independent in home exercise program to improve strength/mobility for better functional independence with ADLs.    Baseline  hep compliant    Time  2    Period  Weeks    Status  Achieved      PT SHORT TERM GOAL #2   Title  Patient will require min cueing for STS transfer with CGA for increased independence with mobility.     Baseline  CGA    Time  2    Period  Weeks    Status  Achieved      PT SHORT TERM GOAL #3   Title  Patient will maintain upright posture for > 15 seconds to demonstrate strengthened postural control muscualture     Baseline  18 seconds     Time  2     Period  Weeks    Status  Achieved      PT SHORT TERM GOAL #4   Title  Patient will perform         PT Long Term Goals - 09/03/17 1446      PT LONG TERM GOAL #1   Title  Patient will perform bed mobility with Supervision to increase independent mobility    Baseline  able to perform mod I for increased time , occasional Min A for scooting up in bed    Time  8    Period  Weeks    Status  Partially Met    Target Date  10/29/17      PT LONG TERM GOAL #2   Title  Patient (< 79 years old) will complete five times sit to stand test in < 10 seconds indicating an increased LE strength and improved balance.    Baseline  2/18: 30 seconds from plinth chair height single UE on seat and single on walker ; 32 seconds from plinth with single UE on seat and single on walker. 4/10: 26 seconds from [facility] with one hand on walker5/29: 22.2 seconds with one hand on walker one hand on WC    Time  8    Period  Weeks    Status  Partially Met    Target Date  10/29/17      PT LONG TERM GOAL #3   Title  Patient will ambulate 40 ft with walker with no breaks to allow for increased mobility within home.     Baseline  12/26: 29 ft with walker and WC follow with no rest breaks; 2/18: multiple trials previous session of >100 ft     Time  8    Period  Weeks    Status  Achieved      PT LONG TERM GOAL #4   Title  Patient will increase BLE gross strength to 4+/5 as to improve functional strength for independent gait, increased standing tolerance and increased ADL ability.    Baseline   4-/5 RLE,     Time  8    Period  Weeks    Status  Partially Met    Target Date  10/29/17  PT LONG TERM GOAL #5   Title  Patient will increase lower extremity functional scale to >40/80 to demonstrate improved functional mobility and increased tolerance with ADLs.     Baseline  14/80; 12/26: 21/80; 2/18: 28/80  4/10; 25/80 5/29: 27/80    Time  8    Period  Weeks    Status  Partially Met    Target Date  10/29/17      PT  LONG TERM GOAL #6   Title  Patient will walk through a door involving one small step up/down to increase functional independence.     Baseline  require CGA and min Verbal cueing with BUE support in // bars at this time    Time  8    Period  Weeks    Status  Partially Met    Target Date  10/29/17      PT LONG TERM GOAL #7   Title  Patient will ambulate 40 ft with least assistive device and Supervision with no breaks to allow for increased mobility within home.     Baseline  requires use of walker     Time  8    Period  Weeks    Status  Partially Met    Target Date  10/29/17      PT LONG TERM GOAL #8   Title  Patient will perform 10 MWT in < 1 minute to demonstrate improved ambulatory velocity and capacity.     Baseline  4/10: 2 minutes 45 seconds; 5/29: 2 minute 31 seconds    Time  8    Period  Weeks    Status  Partially Met    Target Date  10/29/17            Plan - 09/08/17 1542    Clinical Impression Statement  Patient presented to therapy with generalized fatigue in LE from this weekends activities. Low back pain in standing reaching interventions limited duration of performance of task.  Seated intervention placed between two postural standing tasks to reduce tension on back.Patient will continue to benefit from skilled physical therapy for improved strength and balance.      Rehab Potential  Fair    Clinical Impairments Affecting Rehab Potential  hx of HTN, HLD, CVA, learning disability, Diabetes, brain tumor,     PT Frequency  2x / week    PT Duration  8 weeks    PT Treatment/Interventions  ADLs/Self Care Home Management;Aquatic Therapy;Ultrasound;Moist Heat;Traction;DME Instruction;Gait training;Stair training;Functional mobility training;Therapeutic activities;Therapeutic exercise;Orthotic Fit/Training;Neuromuscular re-education;Balance training;Patient/family education;Manual techniques;Wheelchair mobility training;Passive range of motion;Energy  conservation;Taping;Visual/perceptual remediation/compensation    PT Next Visit Plan  floor recovery     PT Home Exercise Plan  see sheet    Consulted and Agree with Plan of Care  Patient;Family member/caregiver       Patient will benefit from skilled therapeutic intervention in order to improve the following deficits and impairments:  Abnormal gait, Decreased activity tolerance, Decreased balance, Decreased knowledge of precautions, Decreased endurance, Decreased coordination, Decreased knowledge of use of DME, Decreased mobility, Decreased range of motion, Difficulty walking, Decreased safety awareness, Decreased strength, Impaired flexibility, Impaired perceived functional ability, Impaired tone, Postural dysfunction, Improper body mechanics, Pain  Visit Diagnosis: Muscle weakness (generalized)  Other lack of coordination  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 Acute CVA (cerebrovascular accident) (  [place]) 05/02/2016  . Left-sided weakness 05/01/2016   Janna Arch, PT, DPT   09/08/2017, 3:59 PM  [place] MAIN [facility] SERVICES [address] [place], [place], [address] Phone: [phone]   Fax:  [phone]  Name: Curtis Powell MRN: [id number] Date of Birth: 10-09-1965

## 2017-09-10 ENCOUNTER — Ambulatory Visit: Payer: Medicare HMO | Admitting: Occupational Therapy

## 2017-09-10 ENCOUNTER — Ambulatory Visit: Payer: Medicare HMO

## 2017-09-10 ENCOUNTER — Encounter: Payer: Self-pay | Admitting: Occupational Therapy

## 2017-09-10 DIAGNOSIS — I69351 Hemiplegia and hemiparesis following cerebral infarction affecting right dominant side: Secondary | ICD-10-CM

## 2017-09-10 DIAGNOSIS — R2689 Other abnormalities of gait and mobility: Secondary | ICD-10-CM

## 2017-09-10 DIAGNOSIS — M6281 Muscle weakness (generalized): Secondary | ICD-10-CM

## 2017-09-10 DIAGNOSIS — R278 Other lack of coordination: Secondary | ICD-10-CM

## 2017-09-10 NOTE — Therapy (Signed)
[place] MAIN [facility] SERVICES [address] [place], [place], [address] Phone: [phone]   Fax:  [phone]  Occupational Therapy Treatment  Patient Details  Name: Curtis Powell MRN: [id number] Date of Birth: 04-18-1965 Referring Provider: Dr. Larinda Buttery   Encounter Date: 09/10/2017  OT End of Session - 09/10/17 1417    Visit Number  33    Number of Visits  36    Date for OT Re-Evaluation  10/15/17    Authorization Type  Vist 6 of 10 for progress reporting period starting 07/28/2017    OT Start Time  1410    OT Stop Time  1500    OT Time Calculation (min)  50 min    Activity Tolerance  Patient tolerated treatment well    Behavior During Therapy  [facility] for tasks assessed/performed       Past Medical History:  Diagnosis Date  . GERD (gastroesophageal reflux disease)   . Hyperlipidemia   . Hypertension   . Obesity   . Stroke [facility])     Past Surgical History:  Procedure Laterality Date  . BRAIN SURGERY      There were no vitals filed for this visit.  Subjective Assessment - 09/10/17 1415    Subjective   Pt. rpeorts he is feeling better    Patient is accompained by:  Family member    Pertinent History  Pt. is a 52 y.o. male who suffered a CVA on 05/01/2016. Pt. was admitted to the hospital. Once discharged, he received Home Health PT and OT services for about a month. Pt. has had multiple CVAs over the past 8 years, and has had multiple falls in the past 6 months. Pt. resides in an apartment. Pt. has caregivers for 80 hours. Pt.'s mother stays with pt. at night, and assists with IADL tasks.      Patient Stated Goals  To be able to throw a ball and dribble a ball.    Currently in Pain?  No/denies       OT TREATMENT    Neuro muscular re-education:  Pt. worked on grasping 1/8" pegs using long nosed tweezers with his right hand. Pt. required cues, and assist for coordination, and movement patterns.  Therapeutic  Exercise:  Pt.tolerated AAROM/AROM for the right shoulder flexion abduction, adduction, horizontal abduction. Pt.worked on UE strengthening using red theraband for left shoulder flexion, abduction, bilateral elbow flexion, and extension.                          OT Education - 09/10/17 1416    Education provided  Yes    Education Details  UE ther. ex.    Person(s) Educated  Patient    Methods  Explanation;Demonstration    Comprehension  Verbalized understanding;Returned demonstration;Verbal cues required;Need further instruction    Education Details  UE ther. ex.    Person(s) Educated  Patient    Methods  Explanation;Demonstration;Verbal cues    Comprehension  Verbalized understanding;Returned demonstration       OT Short Term Goals - 03/03/17 1459      OT SHORT TERM GOAL #1   Title  `        OT Long Term Goals - 07/23/17 1421      OT LONG TERM GOAL #2   Title  Pt. will complete self grooming with minA    Baseline  Pt. continues to require extensive assist. Pt. is improving indedependent with  oral care with set-up, however requires extensive assist for self-grooming tasls.    Time  12    Period  Weeks    Status  On-going    Target Date  10/15/17      OT LONG TERM GOAL #3   Title  Pt. will perform self-feeding skills with modified indepndence    Baseline   07/23/2017: Pt. is able to sccop, and spear the food off of the plate, however requires assist for the hand to mouth pattern. Pt. no longer needs to use built-up foam handle for utensils.    Time  12    Period  Weeks    Status  New    Target Date  10/15/17      OT LONG TERM GOAL #4   Title  Pt. will perform self dressing with minA and A/E as needed.    Baseline  07/23/2017: Pt. continues to require extensive assist: Pt. continues to have difficulty perfroming UE,a nd LE dressing.    Time  12    Period  Weeks    Status  On-going    Target Date  10/15/17      OT LONG TERM GOAL #5   Title   Pt. will perform light home making tasks with minA    Baseline  07/23/2017: Pt. continues to have difficulty with folding laundry, washing dishes while standing at the sinkside. Pt. is able to make a sandwich, and heat things up in th microwave.    Time  12    Period  Weeks    Status  On-going    Target Date  10/15/17      OT LONG TERM GOAL #6   Title  Pt. will write his name efficiently with 100% legibility    Baseline  07/23/2017: 60% legibility for name.    Time  12    Period  Weeks    Status  On-going    Target Date  10/15/17            Plan - 09/10/17 1418    Clinical Impression Statement  Pt. is planning to move from an apartment in [place] to a mobile home [place] the edge of [place]. Pt. presents improved ROM in the RUE with no complaints of pain. Pt. continues to work on improving BUE strength, and coordination for improved ADL, and IADL functioning. Pt. continues to work on improving UE functioning for improved engagement in ADL, and IADL tasks.    Occupational performance deficits (Please refer to evaluation for details):  ADL's;IADL's    Rehab Potential  Fair    OT Frequency  2x / week    OT Duration  12 weeks    OT Treatment/Interventions  Self-care/ADL training;Patient/family education;DME and/or AE instruction;Therapeutic exercise;Moist Heat;Neuromuscular education    Clinical Decision Making  Several treatment options, min-mod task modification necessary    Consulted and Agree with Plan of Care  Patient       Patient will benefit from skilled therapeutic intervention in order to improve the following deficits and impairments:  Pain, Decreased cognition, Decreased activity tolerance, Impaired UE functional use, Decreased range of motion, Decreased coordination, Decreased endurance  Visit Diagnosis: Muscle weakness (generalized)  Other lack of coordination    Problem List Patient Active Problem List   Diagnosis Date Noted  . Acute CVA (cerebrovascular accident)  ([place]) 05/02/2016  . Left-sided weakness 05/01/2016    Harrel Carina, MS, OTR/L 09/10/2017, 2:44 PM  [place] MAIN RE[facility]  SERVICES [place], [place], [address] Phone: [phone]   Fax:  [phone]  Name: Curtis Powell MRN: [id number] Date of Birth: Feb 19, 1966

## 2017-09-10 NOTE — Therapy (Signed)
[place] MAIN [facility] SERVICES [address] [place], [place], [address] Phone: [phone]   Fax:  [phone]  Physical Therapy Treatment  Patient Details  Name: Curtis Powell MRN: [id number] Date of Birth: 1965-12-17 Referring Provider: Dr. Larinda Buttery   Encounter Date: 09/10/2017  PT End of Session - 09/10/17 1512    Visit Number  45    Number of Visits  59    Date for PT Re-Evaluation  10/29/17    Authorization Type  2/10    PT Start Time  1505    PT Stop Time  1551    PT Time Calculation (min)  46 min    Equipment Utilized During Treatment  Gait belt    Activity Tolerance  Patient tolerated treatment well;No increased pain    Behavior During Therapy  WFL for tasks assessed/performed       Past Medical History:  Diagnosis Date  . GERD (gastroesophageal reflux disease)   . Hyperlipidemia   . Hypertension   . Obesity   . Stroke [facility])     Past Surgical History:  Procedure Laterality Date  . BRAIN SURGERY      There were no vitals filed for this visit.  Subjective Assessment - 09/10/17 1510    Subjective  Patient reports coughing a lot when trying to eat his cereal this morning. Reports walked in house since last session. No stumbles or falls since last session.     Pertinent History   Patient is a pleasant 52 year old male who presents to physical therapy for weakness and immobility secondary to CVA.  Had a stroke in Feb 2018. He was previously fully independent, but this stroke caused severe residual deficits, mainly on the right side as well as speech, and now he is unable to walk or perform most of his ADLs on his own. Entire right side is very weak. He still has a little difficulty with speech but his swallowing is improved to baseline. His mom had to move in with him and now is his main caregiver.     Limitations  Sitting;Lifting;Standing;Walking;House hold activities    How long can you sit comfortably?  5  minutes    How long can you stand comfortably?  5 minutes    How long can you walk comfortably?  10 ft    Patient Stated Goals  Walk without walker, walk further with walker, get some strength in back     Currently in Pain?  No/denies            Nustep Lvl 4 ; 4 minutes cues for over 60 rpm for cardiovascular        Ambulate 127f in hallway with CGA.; cues for upright posture, increased hip flexion. Occasional buckling of RLE and hyperextension of R knee. One seated rest break   Bathroom mobility with CGA and verbal cueing for sequencing. Min-Mod A for toileting.     Pt. response to medical necessity:  Patient will continue to benefit from skilled physical therapy to improve strength balance and gait safety                     PT Education - 09/10/17 1511    Education provided  Yes    Education Details  exercise technique     Person(s) Educated  Patient    Methods  Explanation;Demonstration;Verbal cues    Comprehension  Verbalized understanding;Returned demonstration       PT  Short Term Goals - 07/16/17 1531      PT SHORT TERM GOAL #1   Title  Patient will be independent in home exercise program to improve strength/mobility for better functional independence with ADLs.    Baseline  hep compliant    Time  2    Period  Weeks    Status  Achieved      PT SHORT TERM GOAL #2   Title  Patient will require min cueing for STS transfer with CGA for increased independence with mobility.     Baseline  CGA    Time  2    Period  Weeks    Status  Achieved      PT SHORT TERM GOAL #3   Title  Patient will maintain upright posture for > 15 seconds to demonstrate strengthened postural control muscualture     Baseline  18 seconds     Time  2    Period  Weeks    Status  Achieved      PT SHORT TERM GOAL #4   Title  Patient will perform         PT Long Term Goals - 09/03/17 1446      PT LONG TERM GOAL #1   Title  Patient will perform bed mobility with  Supervision to increase independent mobility    Baseline  able to perform mod I for increased time , occasional Min A for scooting up in bed    Time  8    Period  Weeks    Status  Partially Met    Target Date  10/29/17      PT LONG TERM GOAL #2   Title  Patient (< 58 years old) will complete five times sit to stand test in < 10 seconds indicating an increased LE strength and improved balance.    Baseline  2/18: 30 seconds from plinth chair height single UE on seat and single on walker ; 32 seconds from plinth with single UE on seat and single on walker. 4/10: 26 seconds from [facility] with one hand on walker5/29: 22.2 seconds with one hand on walker one hand on WC    Time  8    Period  Weeks    Status  Partially Met    Target Date  10/29/17      PT LONG TERM GOAL #3   Title  Patient will ambulate 40 ft with walker with no breaks to allow for increased mobility within home.     Baseline  12/26: 29 ft with walker and WC follow with no rest breaks; 2/18: multiple trials previous session of >100 ft     Time  8    Period  Weeks    Status  Achieved      PT LONG TERM GOAL #4   Title  Patient will increase BLE gross strength to 4+/5 as to improve functional strength for independent gait, increased standing tolerance and increased ADL ability.    Baseline   4-/5 RLE,     Time  8    Period  Weeks    Status  Partially Met    Target Date  10/29/17      PT LONG TERM GOAL #5   Title  Patient will increase lower extremity functional scale to >40/80 to demonstrate improved functional mobility and increased tolerance with ADLs.     Baseline  14/80; 12/26: 21/80; 2/18: 28/80  4/10; 25/80 5/29: 27/80    Time  8    Period  Weeks    Status  Partially Met    Target Date  10/29/17      PT LONG TERM GOAL #6   Title  Patient will walk through a door involving one small step up/down to increase functional independence.     Baseline  require CGA and min Verbal cueing with BUE support in // bars at this time     Time  8    Period  Weeks    Status  Partially Met    Target Date  10/29/17      PT LONG TERM GOAL #7   Title  Patient will ambulate 40 ft with least assistive device and Supervision with no breaks to allow for increased mobility within home.     Baseline  requires use of walker     Time  8    Period  Weeks    Status  Partially Met    Target Date  10/29/17      PT LONG TERM GOAL #8   Title  Patient will perform 10 MWT in < 1 minute to demonstrate improved ambulatory velocity and capacity.     Baseline  4/10: 2 minutes 45 seconds; 5/29: 2 minute 31 seconds    Time  8    Period  Weeks    Status  Partially Met    Target Date  10/29/17            Plan - 09/10/17 1558    Clinical Impression Statement  Patient continues to progress with ambulatory capacity. Fatigue results in increased episodes of R knee buckling and excessive trunk flexion.  Patient will continue to benefit from skilled physical therapy to improve strength balance and gait safety    Rehab Potential  Fair    Clinical Impairments Affecting Rehab Potential  hx of HTN, HLD, CVA, learning disability, Diabetes, brain tumor,     PT Frequency  2x / week    PT Duration  8 weeks    PT Treatment/Interventions  ADLs/Self Care Home Management;Aquatic Therapy;Ultrasound;Moist Heat;Traction;DME Instruction;Gait training;Stair training;Functional mobility training;Therapeutic activities;Therapeutic exercise;Orthotic Fit/Training;Neuromuscular re-education;Balance training;Patient/family education;Manual techniques;Wheelchair mobility training;Passive range of motion;Energy conservation;Taping;Visual/perceptual remediation/compensation    PT Next Visit Plan  floor recovery     PT Home Exercise Plan  see sheet    Consulted and Agree with Plan of Care  Patient;Family member/caregiver       Patient will benefit from skilled therapeutic intervention in order to improve the following deficits and impairments:  Abnormal gait,  Decreased activity tolerance, Decreased balance, Decreased knowledge of precautions, Decreased endurance, Decreased coordination, Decreased knowledge of use of DME, Decreased mobility, Decreased range of motion, Difficulty walking, Decreased safety awareness, Decreased strength, Impaired flexibility, Impaired perceived functional ability, Impaired tone, Postural dysfunction, Improper body mechanics, Pain  Visit Diagnosis: Muscle weakness (generalized)  Other lack of coordination  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 Acute CVA (cerebrovascular accident) ([place]) 05/02/2016  . Left-sided weakness 05/01/2016   Janna Arch, PT, DPT   09/10/2017, 3:58 PM  [place] MAIN [facility] SERVICES [address] [place], [place], [address] Phone: [phone]   Fax:  [phone]  Name: Curtis Powell MRN: [id number] Date of Birth: Nov 08, 1965

## 2017-09-15 ENCOUNTER — Ambulatory Visit: Payer: Medicare HMO

## 2017-09-15 ENCOUNTER — Ambulatory Visit: Payer: Medicare HMO | Admitting: Occupational Therapy

## 2017-09-15 ENCOUNTER — Other Ambulatory Visit: Payer: Self-pay

## 2017-09-15 ENCOUNTER — Encounter: Payer: Self-pay | Admitting: Occupational Therapy

## 2017-09-15 DIAGNOSIS — R2689 Other abnormalities of gait and mobility: Secondary | ICD-10-CM

## 2017-09-15 DIAGNOSIS — R278 Other lack of coordination: Secondary | ICD-10-CM

## 2017-09-15 DIAGNOSIS — M6281 Muscle weakness (generalized): Secondary | ICD-10-CM

## 2017-09-15 DIAGNOSIS — I69351 Hemiplegia and hemiparesis following cerebral infarction affecting right dominant side: Secondary | ICD-10-CM

## 2017-09-15 NOTE — Therapy (Signed)
[place] MAIN [facility] SERVICES [address] [place], [place], [address] Phone: [phone]   Fax:  [phone]  Physical Therapy Treatment  Patient Details  Name: VALERIA KRISKO MRN: [id number] Date of Birth: 05-24-1965 Referring Provider: Dr. Larinda Buttery   Encounter Date: 09/15/2017  PT End of Session - 09/15/17 1506    Visit Number  25    Number of Visits  59    Date for PT Re-Evaluation  10/29/17    Authorization Type  3/10    PT Start Time  1518    PT Stop Time  1602    PT Time Calculation (min)  44 min    Equipment Utilized During Treatment  Gait belt    Activity Tolerance  Patient tolerated treatment well;No increased pain    Behavior During Therapy  WFL for tasks assessed/performed       Past Medical History:  Diagnosis Date  . GERD (gastroesophageal reflux disease)   . Hyperlipidemia   . Hypertension   . Obesity   . Stroke [facility])     Past Surgical History:  Procedure Laterality Date  . BRAIN SURGERY      There were no vitals filed for this visit.  Subjective Assessment - 09/15/17 1545    Subjective  Patient reports no pain today, reports walking to kitchen and back this weekend. No complaints or concerns at this time.     Pertinent History   Patient is a pleasant 52 year old male who presents to physical therapy for weakness and immobility secondary to CVA.  Had a stroke in Feb 2018. He was previously fully independent, but this stroke caused severe residual deficits, mainly on the right side as well as speech, and now he is unable to walk or perform most of his ADLs on his own. Entire right side is very weak. He still has a little difficulty with speech but his swallowing is improved to baseline. His mom had to move in with him and now is his main caregiver.     Limitations  Sitting;Lifting;Standing;Walking;House hold activities    How long can you sit comfortably?  5 minutes    How long can you stand  comfortably?  5 minutes    How long can you walk comfortably?  10 ft    Patient Stated Goals  Walk without walker, walk further with walker, get some strength in back     Currently in Pain?  No/denies      Ambulate 152f in hallway with CGA.; cues for upright posture, increased hip flexion. Occasional buckling of RLE and hyperextension of R knee.    Bathroom mobility with CGA and verbal cueing for sequencing. Min-Mod A for toileting.    Seated 5lb ankle weights  Marching 20x each leg, cues for height of knee raise; 2 sets   LAQ 20x; 2 sets   Adductor squeeze 5 seconds 20x.    Pt. response to medical necessity:  Patient will continue to benefit from skilled physical therapy to improve strength balance and gait safety                          PT Education - 09/15/17 1506    Education provided  Yes    Education Details  exercise technique     Person(s) Educated  Patient    Methods  Explanation;Demonstration;Verbal cues    Comprehension  Verbalized understanding;Returned demonstration  PT Short Term Goals - 07/16/17 1531      PT SHORT TERM GOAL #1   Title  Patient will be independent in home exercise program to improve strength/mobility for better functional independence with ADLs.    Baseline  hep compliant    Time  2    Period  Weeks    Status  Achieved      PT SHORT TERM GOAL #2   Title  Patient will require min cueing for STS transfer with CGA for increased independence with mobility.     Baseline  CGA    Time  2    Period  Weeks    Status  Achieved      PT SHORT TERM GOAL #3   Title  Patient will maintain upright posture for > 15 seconds to demonstrate strengthened postural control muscualture     Baseline  18 seconds     Time  2    Period  Weeks    Status  Achieved      PT SHORT TERM GOAL #4   Title  Patient will perform         PT Long Term Goals - 09/03/17 1446      PT LONG TERM GOAL #1   Title  Patient will perform bed  mobility with Supervision to increase independent mobility    Baseline  able to perform mod I for increased time , occasional Min A for scooting up in bed    Time  8    Period  Weeks    Status  Partially Met    Target Date  10/29/17      PT LONG TERM GOAL #2   Title  Patient (< 18 years old) will complete five times sit to stand test in < 10 seconds indicating an increased LE strength and improved balance.    Baseline  2/18: 30 seconds from plinth chair height single UE on seat and single on walker ; 32 seconds from plinth with single UE on seat and single on walker. 4/10: 26 seconds from [facility] with one hand on walker5/29: 22.2 seconds with one hand on walker one hand on WC    Time  8    Period  Weeks    Status  Partially Met    Target Date  10/29/17      PT LONG TERM GOAL #3   Title  Patient will ambulate 40 ft with walker with no breaks to allow for increased mobility within home.     Baseline  12/26: 29 ft with walker and WC follow with no rest breaks; 2/18: multiple trials previous session of >100 ft     Time  8    Period  Weeks    Status  Achieved      PT LONG TERM GOAL #4   Title  Patient will increase BLE gross strength to 4+/5 as to improve functional strength for independent gait, increased standing tolerance and increased ADL ability.    Baseline   4-/5 RLE,     Time  8    Period  Weeks    Status  Partially Met    Target Date  10/29/17      PT LONG TERM GOAL #5   Title  Patient will increase lower extremity functional scale to >40/80 to demonstrate improved functional mobility and increased tolerance with ADLs.     Baseline  14/80; 12/26: 21/80; 2/18: 28/80  4/10; 25/80 5/29: 27/80    Time  8    Period  Weeks    Status  Partially Met    Target Date  10/29/17      PT LONG TERM GOAL #6   Title  Patient will walk through a door involving one small step up/down to increase functional independence.     Baseline  require CGA and min Verbal cueing with BUE support in // bars  at this time    Time  8    Period  Weeks    Status  Partially Met    Target Date  10/29/17      PT LONG TERM GOAL #7   Title  Patient will ambulate 40 ft with least assistive device and Supervision with no breaks to allow for increased mobility within home.     Baseline  requires use of walker     Time  8    Period  Weeks    Status  Partially Met    Target Date  10/29/17      PT LONG TERM GOAL #8   Title  Patient will perform 10 MWT in < 1 minute to demonstrate improved ambulatory velocity and capacity.     Baseline  4/10: 2 minutes 45 seconds; 5/29: 2 minute 31 seconds    Time  8    Period  Weeks    Status  Partially Met    Target Date  10/29/17            Plan - 09/15/17 1548    Clinical Impression Statement  Patient demonstrated good tolerance of ambulation with increased duration of ambulation prior to seated break. R knee buckling occurred less frequently throughout session demonstrated carryover from previous strengthening.  Patient will continue to benefit from skilled physical therapy to improve strength balance and gait safety    Rehab Potential  Fair    Clinical Impairments Affecting Rehab Potential  hx of HTN, HLD, CVA, learning disability, Diabetes, brain tumor,     PT Frequency  2x / week    PT Duration  8 weeks    PT Treatment/Interventions  ADLs/Self Care Home Management;Aquatic Therapy;Ultrasound;Moist Heat;Traction;DME Instruction;Gait training;Stair training;Functional mobility training;Therapeutic activities;Therapeutic exercise;Orthotic Fit/Training;Neuromuscular re-education;Balance training;Patient/family education;Manual techniques;Wheelchair mobility training;Passive range of motion;Energy conservation;Taping;Visual/perceptual remediation/compensation    PT Next Visit Plan  floor recovery     PT Home Exercise Plan  see sheet    Consulted and Agree with Plan of Care  Patient;Family member/caregiver       Patient will benefit from skilled therapeutic  intervention in order to improve the following deficits and impairments:  Abnormal gait, Decreased activity tolerance, Decreased balance, Decreased knowledge of precautions, Decreased endurance, Decreased coordination, Decreased knowledge of use of DME, Decreased mobility, Decreased range of motion, Difficulty walking, Decreased safety awareness, Decreased strength, Impaired flexibility, Impaired perceived functional ability, Impaired tone, Postural dysfunction, Improper body mechanics, Pain  Visit Diagnosis: Muscle weakness (generalized)  Other lack of coordination  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 Acute CVA (cerebrovascular accident) ([place]) 05/02/2016  . Left-sided weakness 05/01/2016   Janna Arch, PT, DPT   09/15/2017, 4:05 PM  [place] MAIN [facility] SERVICES [address] [place], [place], [address] Phone: [phone]   Fax:  [phone]  Name: DEONTEZ KLINKE MRN: [id number] Date of Birth: April 16, 1965

## 2017-09-15 NOTE — Therapy (Signed)
[place] MAIN [facility] SERVICES [address] [place], [place], [address] Phone: [phone]   Fax:  [phone]  Occupational Therapy Treatment  Patient Details  Name: Curtis Powell MRN: [id number] Date of Birth: July 21, 1965 Referring Provider: Dr. Larinda Buttery   Encounter Date: 09/15/2017  OT End of Session - 09/15/17 1729    Visit Number  34    Number of Visits  36    Date for OT Re-Evaluation  10/15/17    OT Start Time  1430    OT Stop Time  1515    OT Time Calculation (min)  45 min    Activity Tolerance  Patient tolerated treatment well    Behavior During Therapy  [facility] for tasks assessed/performed       Past Medical History:  Diagnosis Date  . GERD (gastroesophageal reflux disease)   . Hyperlipidemia   . Hypertension   . Obesity   . Stroke [facility])     Past Surgical History:  Procedure Laterality Date  . BRAIN SURGERY      There were no vitals filed for this visit.  Subjective Assessment - 09/15/17 1722    Subjective   Pt reports he is doing much better today.    Patient is accompained by:  Family member    Pertinent History  Pt. is a 52 y.o. male who suffered a CVA on 05/01/2016. Pt. was admitted to the hospital. Once discharged, he received Home Health PT and OT services for about a month. Pt. has had multiple CVAs over the past 8 years, and has had multiple falls in the past 6 months. Pt. resides in an apartment. Pt. has caregivers for 80 hours. Pt.'s mother stays with pt. at night, and assists with IADL tasks.      Patient Stated Goals  To be able to throw a ball and dribble a ball.    Currently in Pain?  No/denies    Multiple Pain Sites  No                   OT Treatments/Exercises (OP) - 09/15/17 1723      ADLs   Writing  Pt participated in handwriting activity using large cylindrical pen with 3 inch diameter which helped increase control and legibiltiy with writing his name, address and favorite  foods with mod cues to help with spelling and keeping page turned only slightly to help with fine motor control as he wrote past midline to R edge of paper.  Pt stated he had always held his paper at a 90 angle and with practice holding paper at less of an angle how much this helped with writing skills.        Neurological Re-education Exercises   Other Exercises 1  Pt stated that his R elbow felt very tight during 1# dowel exercise and applied heat for 10 minutes and then stretching for biceps which helped relieve the pulling sensation.  He particpated in green theraputty exercises with pulling, squeezing and pinching to find small pegs and remove from putty.               OT Education - 09/15/17 1728    Education provided  Yes    Education Details  UE ther. ex.    Person(s) Educated  Patient    Methods  Explanation;Demonstration    Comprehension  Verbalized understanding;Returned demonstration;Verbal cues required;Need further instruction       OT Short Term Goals - 03/03/17  [place] #1   Title  `        OT Long Term Goals - 07/23/17 1421      OT LONG TERM GOAL #2   Title  Pt. will complete self grooming with minA    Baseline  Pt. continues to require extensive assist. Pt. is improving indedependent with oral care with set-up, however requires extensive assist for self-grooming tasls.    Time  12    Period  Weeks    Status  On-going    Target Date  10/15/17      OT LONG TERM GOAL #3   Title  Pt. will perform self-feeding skills with modified indepndence    Baseline   07/23/2017: Pt. is able to sccop, and spear the food off of the plate, however requires assist for the hand to mouth pattern. Pt. no longer needs to use built-up foam handle for utensils.    Time  12    Period  Weeks    Status  New    Target Date  10/15/17      OT LONG TERM GOAL #4   Title  Pt. will perform self dressing with minA and A/E as needed.    Baseline  07/23/2017: Pt.  continues to require extensive assist: Pt. continues to have difficulty perfroming UE,a nd LE dressing.    Time  12    Period  Weeks    Status  On-going    Target Date  10/15/17      OT LONG TERM GOAL #5   Title  Pt. will perform light home making tasks with minA    Baseline  07/23/2017: Pt. continues to have difficulty with folding laundry, washing dishes while standing at the sinkside. Pt. is able to make a sandwich, and heat things up in th microwave.    Time  12    Period  Weeks    Status  On-going    Target Date  10/15/17      OT LONG TERM GOAL #6   Title  Pt. will write his name efficiently with 100% legibility    Baseline  07/23/2017: 60% legibility for name.    Time  12    Period  Weeks    Status  On-going    Target Date  10/15/17            Plan - 09/15/17 1729    Clinical Impression Statement  Pt continues to be motivated to regain use of RUe and hand.  He stated that he has not pushing his wheelchair as much with his UEs and this has helped the pain in his shoulders.  He did have some tenderness in R biceps which improved after heat and stretching this session.  Increased legiibility with large adaptive red pen with education on position of paper as well.  He continues to work on improving BUE strength and coordination skills for use during [place].    Occupational performance deficits (Please refer to evaluation for details):  ADL's;IADL's    Rehab Potential  Fair    OT Frequency  2x / week    OT Duration  12 weeks    OT Treatment/Interventions  Self-care/ADL training;Patient/family education;DME and/or AE instruction;Therapeutic exercise;Moist Heat;Neuromuscular education    Clinical Decision Making  Several treatment options, min-mod task modification necessary    Consulted and Agree with Plan of Care  Patient       Patient will  benefit from skilled therapeutic intervention in order to improve the following deficits and impairments:  Pain, Decreased  cognition, Decreased activity tolerance, Impaired UE functional use, Decreased range of motion, Decreased coordination, Decreased endurance  Visit Diagnosis: Muscle weakness (generalized)  Other lack of coordination  Hemiplegia and hemiparesis following cerebral infarction affecting right dominant side [facility])    Problem List Patient Active Problem List   Diagnosis Date Noted  . Acute CVA (cerebrovascular accident) ([place]) 05/02/2016  . Left-sided weakness 05/01/2016    Chrys Racer, OTR/L ascom [phone] 09/15/17, 5:33 PM  [place] MAIN [facility] SERVICES [address] [place], [place], [address] Phone: [phone]   Fax:  [phone]  Name: Curtis Powell MRN: [id number] Date of Birth: 10-May-1965

## 2017-09-16 ENCOUNTER — Ambulatory Visit: Payer: Medicare HMO

## 2017-09-16 ENCOUNTER — Ambulatory Visit: Payer: Medicare HMO | Admitting: Physical Therapy

## 2017-09-17 ENCOUNTER — Ambulatory Visit: Payer: Medicare HMO

## 2017-09-17 DIAGNOSIS — M6281 Muscle weakness (generalized): Secondary | ICD-10-CM

## 2017-09-17 DIAGNOSIS — R278 Other lack of coordination: Secondary | ICD-10-CM

## 2017-09-17 DIAGNOSIS — I69351 Hemiplegia and hemiparesis following cerebral infarction affecting right dominant side: Secondary | ICD-10-CM

## 2017-09-17 DIAGNOSIS — R2689 Other abnormalities of gait and mobility: Secondary | ICD-10-CM

## 2017-09-17 NOTE — Therapy (Signed)
[place] MAIN [facility] SERVICES [address] [place], [place], [address] Phone: [phone]   Fax:  [phone]  Physical Therapy Treatment  Patient Details  Name: Curtis Powell MRN: [id number] Date of Birth: 11-25-1965 Referring Provider: Dr. Larinda Buttery   Encounter Date: 09/17/2017  PT End of Session - 09/17/17 1501    Visit Number  57    Number of Visits  59    Date for PT Re-Evaluation  10/29/17    Authorization Type  4/10    PT Start Time  1412    PT Stop Time  1457    PT Time Calculation (min)  45 min    Equipment Utilized During Treatment  Gait belt    Activity Tolerance  Patient tolerated treatment well;No increased pain    Behavior During Therapy  WFL for tasks assessed/performed       Past Medical History:  Diagnosis Date  . GERD (gastroesophageal reflux disease)   . Hyperlipidemia   . Hypertension   . Obesity   . Stroke [facility])     Past Surgical History:  Procedure Laterality Date  . BRAIN SURGERY      There were no vitals filed for this visit.  Subjective Assessment - 09/17/17 1500    Subjective  Patient reports no pain today, reports walking to kitchen and back yesterday. Reports caregiver wants him to walk with rollater around house.     Pertinent History   Patient is a pleasant 52 year old male who presents to physical therapy for weakness and immobility secondary to CVA.  Had a stroke in Feb 2018. He was previously fully independent, but this stroke caused severe residual deficits, mainly on the right side as well as speech, and now he is unable to walk or perform most of his ADLs on his own. Entire right side is very weak. He still has a little difficulty with speech but his swallowing is improved to baseline. His mom had to move in with him and now is his main caregiver.     Limitations  Sitting;Lifting;Standing;Walking;House hold activities    Patient Stated Goals  Walk without walker, walk further  with walker, get some strength in back     Currently in Pain?  No/denies        Ambulate 4f in hallway with rollater and CGA.; cues for upright posture, increased hip flexion. Occasional buckling of RLE and hyperextension of R knee.    Patient educated on how to use rollator with proper posture and UE reliance  [place] ball reach with decreased UE support for stability to promote upright posture and balance. 2x each arm  Side step in // bars with focus on upright posture with BUE support 2x length of bars.   Step over red theraband on floor 6x each foot to promote increased hip flexion and clearance. BUE support   Static stand with SUE support horizontal head turns 2x30 seconds     Pt. response to medical necessity:  Patient will continue to benefit from skilled physical therapy to improve strength balance and gait safety                     PT Education - 09/17/17 1501    Education provided  Yes    Education Details  ambulate with rollator     Person(s) Educated  Patient    Methods  Explanation;Demonstration;Verbal cues    Comprehension  Verbalized understanding;Returned demonstration  PT Short Term Goals - 07/16/17 1531      PT SHORT TERM GOAL #1   Title  Patient will be independent in home exercise program to improve strength/mobility for better functional independence with ADLs.    Baseline  hep compliant    Time  2    Period  Weeks    Status  Achieved      PT SHORT TERM GOAL #2   Title  Patient will require min cueing for STS transfer with CGA for increased independence with mobility.     Baseline  CGA    Time  2    Period  Weeks    Status  Achieved      PT SHORT TERM GOAL #3   Title  Patient will maintain upright posture for > 15 seconds to demonstrate strengthened postural control muscualture     Baseline  18 seconds     Time  2    Period  Weeks    Status  Achieved      PT SHORT TERM GOAL #4   Title  Patient will perform          PT Long Term Goals - 09/03/17 1446      PT LONG TERM GOAL #1   Title  Patient will perform bed mobility with Supervision to increase independent mobility    Baseline  able to perform mod I for increased time , occasional Min A for scooting up in bed    Time  8    Period  Weeks    Status  Partially Met    Target Date  10/29/17      PT LONG TERM GOAL #2   Title  Patient (< 64 years old) will complete five times sit to stand test in < 10 seconds indicating an increased LE strength and improved balance.    Baseline  2/18: 30 seconds from plinth chair height single UE on seat and single on walker ; 32 seconds from plinth with single UE on seat and single on walker. 4/10: 26 seconds from [facility] with one hand on walker5/29: 22.2 seconds with one hand on walker one hand on WC    Time  8    Period  Weeks    Status  Partially Met    Target Date  10/29/17      PT LONG TERM GOAL #3   Title  Patient will ambulate 40 ft with walker with no breaks to allow for increased mobility within home.     Baseline  12/26: 29 ft with walker and WC follow with no rest breaks; 2/18: multiple trials previous session of >100 ft     Time  8    Period  Weeks    Status  Achieved      PT LONG TERM GOAL #4   Title  Patient will increase BLE gross strength to 4+/5 as to improve functional strength for independent gait, increased standing tolerance and increased ADL ability.    Baseline   4-/5 RLE,     Time  8    Period  Weeks    Status  Partially Met    Target Date  10/29/17      PT LONG TERM GOAL #5   Title  Patient will increase lower extremity functional scale to >40/80 to demonstrate improved functional mobility and increased tolerance with ADLs.     Baseline  14/80; 12/26: 21/80; 2/18: 28/80  4/10; 25/80 5/29: 27/80    Time  8    Period  Weeks    Status  Partially Met    Target Date  10/29/17      PT LONG TERM GOAL #6   Title  Patient will walk through a door involving one small step up/down to  increase functional independence.     Baseline  require CGA and min Verbal cueing with BUE support in // bars at this time    Time  8    Period  Weeks    Status  Partially Met    Target Date  10/29/17      PT LONG TERM GOAL #7   Title  Patient will ambulate 40 ft with least assistive device and Supervision with no breaks to allow for increased mobility within home.     Baseline  requires use of walker     Time  8    Period  Weeks    Status  Partially Met    Target Date  10/29/17      PT LONG TERM GOAL #8   Title  Patient will perform 10 MWT in < 1 minute to demonstrate improved ambulatory velocity and capacity.     Baseline  4/10: 2 minutes 45 seconds; 5/29: 2 minute 31 seconds    Time  8    Period  Weeks    Status  Partially Met    Target Date  10/29/17            Plan - 09/17/17 1502    Clinical Impression Statement  Patient demonstrated improved hip flexion and step length as per step over line intervention resulting in increased clearance. Fatigue results in buckling of RLE.  Patient will continue to benefit from skilled physical therapy to improve strength balance and gait safety    Rehab Potential  Fair    Clinical Impairments Affecting Rehab Potential  hx of HTN, HLD, CVA, learning disability, Diabetes, brain tumor,     PT Frequency  2x / week    PT Duration  8 weeks    PT Treatment/Interventions  ADLs/Self Care Home Management;Aquatic Therapy;Ultrasound;Moist Heat;Traction;DME Instruction;Gait training;Stair training;Functional mobility training;Therapeutic activities;Therapeutic exercise;Orthotic Fit/Training;Neuromuscular re-education;Balance training;Patient/family education;Manual techniques;Wheelchair mobility training;Passive range of motion;Energy conservation;Taping;Visual/perceptual remediation/compensation    PT Next Visit Plan  floor recovery     PT Home Exercise Plan  see sheet    Consulted and Agree with Plan of Care  Patient;Family member/caregiver        Patient will benefit from skilled therapeutic intervention in order to improve the following deficits and impairments:  Abnormal gait, Decreased activity tolerance, Decreased balance, Decreased knowledge of precautions, Decreased endurance, Decreased coordination, Decreased knowledge of use of DME, Decreased mobility, Decreased range of motion, Difficulty walking, Decreased safety awareness, Decreased strength, Impaired flexibility, Impaired perceived functional ability, Impaired tone, Postural dysfunction, Improper body mechanics, Pain  Visit Diagnosis: Muscle weakness (generalized)  Other lack of coordination  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 Acute CVA (cerebrovascular accident) ([place]) 05/02/2016  . Left-sided weakness 05/01/2016   Janna Arch, PT, DPT   09/17/2017, 3:03 PM  [place] MAIN [facility] SERVICES [address] [place], [place], [address] Phone: [phone]   Fax:  [phone]  Name: Curtis Powell MRN: [id number] Date of Birth: 07-02-1965

## 2017-09-18 ENCOUNTER — Ambulatory Visit: Payer: Medicare HMO

## 2017-09-18 ENCOUNTER — Ambulatory Visit: Payer: Medicare HMO | Admitting: Physical Therapy

## 2017-09-22 ENCOUNTER — Ambulatory Visit: Payer: Medicare HMO

## 2017-09-22 ENCOUNTER — Encounter: Payer: Self-pay | Admitting: Occupational Therapy

## 2017-09-22 ENCOUNTER — Ambulatory Visit: Payer: Medicare HMO | Admitting: Occupational Therapy

## 2017-09-22 DIAGNOSIS — M6281 Muscle weakness (generalized): Secondary | ICD-10-CM

## 2017-09-22 DIAGNOSIS — I69351 Hemiplegia and hemiparesis following cerebral infarction affecting right dominant side: Secondary | ICD-10-CM

## 2017-09-22 DIAGNOSIS — R278 Other lack of coordination: Secondary | ICD-10-CM

## 2017-09-22 DIAGNOSIS — R2689 Other abnormalities of gait and mobility: Secondary | ICD-10-CM

## 2017-09-22 NOTE — Therapy (Signed)
[place] MAIN [facility] SERVICES [address] [place], [place], [address] Phone: [phone]   Fax:  [phone]  Occupational Therapy Treatment  Patient Details  Name: Curtis Powell MRN: [id number] Date of Birth: 1965/07/12 Referring Provider: Dr. Larinda Buttery   Encounter Date: 09/22/2017  OT End of Session - 09/22/17 1449    Visit Number  35    Number of Visits  36    Date for OT Re-Evaluation  10/15/17    Authorization Type  [place] 7 of 10 for progress reporting period starting 07/28/2017    OT Start Time  1430    OT Stop Time  1515    OT Time Calculation (min)  45 min    Activity Tolerance  Patient tolerated treatment well    Behavior During Therapy  [facility] for tasks assessed/performed       Past Medical History:  Diagnosis Date  . GERD (gastroesophageal reflux disease)   . Hyperlipidemia   . Hypertension   . Obesity   . Stroke [facility])     Past Surgical History:  Procedure Laterality Date  . BRAIN SURGERY      There were no vitals filed for this visit.  Subjective Assessment - 09/22/17 1447    Subjective   Pt. reports he is doing better.    Patient is accompained by:  Family member    Pertinent History  Pt. is a 52 y.o. male who suffered a CVA on 05/01/2016. Pt. was admitted to the hospital. Once discharged, he received Home Health PT and OT services for about a month. Pt. has had multiple CVAs over the past 8 years, and has had multiple falls in the past 6 months. Pt. resides in an apartment. Pt. has caregivers for 80 hours. Pt.'s mother stays with pt. at night, and assists with IADL tasks.      Patient Stated Goals  To be able to throw a ball and dribble a ball.    Currently in Pain?  Yes    Pain Score  2     Pain Location  Shoulder      OT TREATMENT    Neuro muscular re-education:  Pt. worked on [facility] skills manipulating nuts, and bolts on a bolt board. Pt. worked on screwing, and unscrewing nuts, and bolts of  varying sizes, and challenging progressively smaller items. Pt. worked on UE motor control, and coordination skills while handling, and using tools. Pt. worked on Contractor, IT trainer, and screw drivers to remove screws, and bolts of varying sizes.   Therapeutic Exercise:  Pt.tolerated AAROM/AROM for the right shoulder flexion abduction, adduction, horizontal abduction seated.                          OT Education - 09/22/17 1449    Education provided  Yes    Education Details  Strength, and [facility] skills.    Person(s) Educated  Patient    Methods  Explanation;Demonstration    Comprehension  Verbalized understanding;Returned demonstration;Verbal cues required;Need further instruction       OT Short Term Goals - 03/03/17 1459      OT SHORT TERM GOAL #1   Title  `        OT Long Term Goals - 07/23/17 1421      OT LONG TERM GOAL #2   Title  Pt. will complete self grooming with minA    Baseline  Pt. continues to  require extensive assist. Pt. is improving indedependent with oral care with set-up, however requires extensive assist for self-grooming tasls.    Time  12    Period  Weeks    Status  On-going    Target Date  10/15/17      OT LONG TERM GOAL #3   Title  Pt. will perform self-feeding skills with modified indepndence    Baseline   07/23/2017: Pt. is able to sccop, and spear the food off of the plate, however requires assist for the hand to mouth pattern. Pt. no longer needs to use built-up foam handle for utensils.    Time  12    Period  Weeks    Status  New    Target Date  10/15/17      OT LONG TERM GOAL #4   Title  Pt. will perform self dressing with minA and A/E as needed.    Baseline  07/23/2017: Pt. continues to require extensive assist: Pt. continues to have difficulty perfroming UE,a nd LE dressing.    Time  12    Period  Weeks    Status  On-going    Target Date  10/15/17      OT LONG TERM GOAL #5   Title  Pt. will  perform light home making tasks with minA    Baseline  07/23/2017: Pt. continues to have difficulty with folding laundry, washing dishes while standing at the sinkside. Pt. is able to make a sandwich, and heat things up in th microwave.    Time  12    Period  Weeks    Status  On-going    Target Date  10/15/17      OT LONG TERM GOAL #6   Title  Pt. will write his name efficiently with 100% legibility    Baseline  07/23/2017: 60% legibility for name.    Time  12    Period  Weeks    Status  On-going    Target Date  10/15/17            Plan - 09/22/17 1450    Clinical Impression Statement  Pt. reports that he is waiting to move into the mobile home until his mother completes the paperwork. Pt. continues to make sandwiches independently at home. Pt. reports his shoulder is improving. Continues to work on improving UE strength, and fine motor coordination skills in order to improve UE functioning during ADLs, and IADLs.    Occupational performance deficits (Please refer to evaluation for details):  ADL's;IADL's    Rehab Potential  Fair    OT Frequency  2x / week    OT Duration  12 weeks    OT Treatment/Interventions  Self-care/ADL training;Patient/family education;DME and/or AE instruction;Therapeutic exercise;Moist Heat;Neuromuscular education    Clinical Decision Making  Several treatment options, min-mod task modification necessary    Consulted and Agree with Plan of Care  Patient       Patient will benefit from skilled therapeutic intervention in order to improve the following deficits and impairments:  Pain, Decreased cognition, Decreased activity tolerance, Impaired UE functional use, Decreased range of motion, Decreased coordination, Decreased endurance  Visit Diagnosis: Muscle weakness (generalized)  Other lack of coordination    Problem List Patient Active Problem List   Diagnosis Date Noted  . Acute CVA (cerebrovascular accident) ([place]) 05/02/2016  . Left-sided  weakness 05/01/2016    Harrel Carina, MS, OTR/L 09/22/2017, 3:00 PM  [place] MAIN [facility] SERVICES  [place], [place], [address] Phone: [phone]   Fax:  [phone]  Name: Curtis Powell MRN: [id number] Date of Birth: 1965-11-03

## 2017-09-22 NOTE — Therapy (Signed)
[place] MAIN [facility] SERVICES [address] [place], [place], [address] Phone: [phone]   Fax:  [phone]  Physical Therapy Treatment  Patient Details  Name: Curtis Powell MRN: [id number] Date of Birth: Jan 24, 1966 Referring Provider: Dr. Larinda Buttery   Encounter Date: 09/22/2017  PT End of Session - 09/22/17 1551    Visit Number  48    Number of Visits  59    Date for PT Re-Evaluation  10/29/17    Authorization Type  5/10    PT Start Time  0355    PT Stop Time  1559    PT Time Calculation (min)  44 min    Equipment Utilized During Treatment  Gait belt    Activity Tolerance  Patient tolerated treatment well;No increased pain    Behavior During Therapy  WFL for tasks assessed/performed       Past Medical History:  Diagnosis Date  . GERD (gastroesophageal reflux disease)   . Hyperlipidemia   . Hypertension   . Obesity   . Stroke [facility])     Past Surgical History:  Procedure Laterality Date  . BRAIN SURGERY      There were no vitals filed for this visit.  Subjective Assessment - 09/22/17 1550    Subjective  Patient brought rollater from home with him today to practice for carryover into home environment. Reports no pain or LOB since last session.     Pertinent History   Patient is a pleasant 52 year old male who presents to physical therapy for weakness and immobility secondary to CVA.  Had a stroke in Feb 2018. He was previously fully independent, but this stroke caused severe residual deficits, mainly on the right side as well as speech, and now he is unable to walk or perform most of his ADLs on his own. Entire right side is very weak. He still has a little difficulty with speech but his swallowing is improved to baseline. His mom had to move in with him and now is his main caregiver.     Limitations  Sitting;Lifting;Standing;Walking;House hold activities    Patient Stated Goals  Walk without walker, walk further  with walker, get some strength in back     Currently in Pain?  No/denies       Ambulate 28f in hallway with rollater and CGA.; cues for upright posture, increased hip flexion. Occasional buckling of RLE and hyperextension of R knee.    Patient educated on how to use rollator with proper posture and UE reliance   Static marching 20x // bars BUE support  Seated marching focus on high knees 20x each leg   Side step in // bars with focus on upright posture with BUE support 2x length of bars.       Pt. response to medical necessity:  Patient will continue to benefit from skilled physical therapy to improve strength balance and gait safety                         PT Education - 09/22/17 1551    Education provided  Yes    Education Details  ambulate with rollater for carryover to home.     Person(s) Educated  Patient    Methods  Explanation;Demonstration;Verbal cues;Tactile cues    Comprehension  Verbalized understanding;Returned demonstration;Need further instruction       PT Short Term Goals - 07/16/17 1531      PT SHORT TERM  GOAL #1   Title  Patient will be independent in home exercise program to improve strength/mobility for better functional independence with ADLs.    Baseline  hep compliant    Time  2    Period  Weeks    Status  Achieved      PT SHORT TERM GOAL #2   Title  Patient will require min cueing for STS transfer with CGA for increased independence with mobility.     Baseline  CGA    Time  2    Period  Weeks    Status  Achieved      PT SHORT TERM GOAL #3   Title  Patient will maintain upright posture for > 15 seconds to demonstrate strengthened postural control muscualture     Baseline  18 seconds     Time  2    Period  Weeks    Status  Achieved      PT SHORT TERM GOAL #4   Title  Patient will perform         PT Long Term Goals - 09/03/17 1446      PT LONG TERM GOAL #1   Title  Patient will perform bed mobility with Supervision  to increase independent mobility    Baseline  able to perform mod I for increased time , occasional Min A for scooting up in bed    Time  8    Period  Weeks    Status  Partially Met    Target Date  10/29/17      PT LONG TERM GOAL #2   Title  Patient (< 68 years old) will complete five times sit to stand test in < 10 seconds indicating an increased LE strength and improved balance.    Baseline  2/18: 30 seconds from plinth chair height single UE on seat and single on walker ; 32 seconds from plinth with single UE on seat and single on walker. 4/10: 26 seconds from [facility] with one hand on walker5/29: 22.2 seconds with one hand on walker one hand on WC    Time  8    Period  Weeks    Status  Partially Met    Target Date  10/29/17      PT LONG TERM GOAL #3   Title  Patient will ambulate 40 ft with walker with no breaks to allow for increased mobility within home.     Baseline  12/26: 29 ft with walker and WC follow with no rest breaks; 2/18: multiple trials previous session of >100 ft     Time  8    Period  Weeks    Status  Achieved      PT LONG TERM GOAL #4   Title  Patient will increase BLE gross strength to 4+/5 as to improve functional strength for independent gait, increased standing tolerance and increased ADL ability.    Baseline   4-/5 RLE,     Time  8    Period  Weeks    Status  Partially Met    Target Date  10/29/17      PT LONG TERM GOAL #5   Title  Patient will increase lower extremity functional scale to >40/80 to demonstrate improved functional mobility and increased tolerance with ADLs.     Baseline  14/80; 12/26: 21/80; 2/18: 28/80  4/10; 25/80 5/29: 27/80    Time  8    Period  Weeks    Status  Partially Met  Target Date  10/29/17      PT LONG TERM GOAL #6   Title  Patient will walk through a door involving one small step up/down to increase functional independence.     Baseline  require CGA and min Verbal cueing with BUE support in // bars at this time    Time  8     Period  Weeks    Status  Partially Met    Target Date  10/29/17      PT LONG TERM GOAL #7   Title  Patient will ambulate 40 ft with least assistive device and Supervision with no breaks to allow for increased mobility within home.     Baseline  requires use of walker     Time  8    Period  Weeks    Status  Partially Met    Target Date  10/29/17      PT LONG TERM GOAL #8   Title  Patient will perform 10 MWT in < 1 minute to demonstrate improved ambulatory velocity and capacity.     Baseline  4/10: 2 minutes 45 seconds; 5/29: 2 minute 31 seconds    Time  8    Period  Weeks    Status  Partially Met    Target Date  10/29/17            Plan - 09/22/17 1606    Clinical Impression Statement  Patient educated on ambulation with rollater, frequently struggled with forward momentum due to reliance upon UE's causing lateral momentum. Patient LLE fatigued quickly today requiring seated rest breaks.  Patient will continue to benefit from skilled physical therapy to improve strength balance and gait safety    Rehab Potential  Fair    Clinical Impairments Affecting Rehab Potential  hx of HTN, HLD, CVA, learning disability, Diabetes, brain tumor,     PT Frequency  2x / week    PT Duration  8 weeks    PT Treatment/Interventions  ADLs/Self Care Home Management;Aquatic Therapy;Ultrasound;Moist Heat;Traction;DME Instruction;Gait training;Stair training;Functional mobility training;Therapeutic activities;Therapeutic exercise;Orthotic Fit/Training;Neuromuscular re-education;Balance training;Patient/family education;Manual techniques;Wheelchair mobility training;Passive range of motion;Energy conservation;Taping;Visual/perceptual remediation/compensation    PT Next Visit Plan  floor recovery     PT Home Exercise Plan  see sheet    Consulted and Agree with Plan of Care  Patient;Family member/caregiver       Patient will benefit from skilled therapeutic intervention in order to improve the  following deficits and impairments:  Abnormal gait, Decreased activity tolerance, Decreased balance, Decreased knowledge of precautions, Decreased endurance, Decreased coordination, Decreased knowledge of use of DME, Decreased mobility, Decreased range of motion, Difficulty walking, Decreased safety awareness, Decreased strength, Impaired flexibility, Impaired perceived functional ability, Impaired tone, Postural dysfunction, Improper body mechanics, Pain  Visit Diagnosis: Muscle weakness (generalized)  Other lack of coordination  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 Acute CVA (cerebrovascular accident) ([place]) 05/02/2016  . Left-sided weakness 05/01/2016   Janna Arch, PT, DPT   09/22/2017, 4:06 PM  [place] MAIN [facility] SERVICES [address] [place], [place], [address] Phone: [phone]   Fax:  [phone]  Name: Curtis Powell MRN: [id number] Date of Birth: June 08, 1965

## 2017-09-23 ENCOUNTER — Ambulatory Visit: Payer: Medicare HMO | Admitting: Physical Therapy

## 2017-09-23 ENCOUNTER — Ambulatory Visit: Payer: Medicare HMO

## 2017-09-24 ENCOUNTER — Ambulatory Visit: Payer: Medicare HMO | Admitting: Occupational Therapy

## 2017-09-24 ENCOUNTER — Encounter: Payer: Self-pay | Admitting: Occupational Therapy

## 2017-09-24 ENCOUNTER — Ambulatory Visit: Payer: Medicare HMO

## 2017-09-24 DIAGNOSIS — M6281 Muscle weakness (generalized): Secondary | ICD-10-CM

## 2017-09-24 DIAGNOSIS — R2689 Other abnormalities of gait and mobility: Secondary | ICD-10-CM

## 2017-09-24 DIAGNOSIS — I69351 Hemiplegia and hemiparesis following cerebral infarction affecting right dominant side: Secondary | ICD-10-CM

## 2017-09-24 DIAGNOSIS — R278 Other lack of coordination: Secondary | ICD-10-CM

## 2017-09-24 NOTE — Therapy (Signed)
[facility] [address] [place], [place], [address] Phone: [phone]   Fax:  [phone]  Physical Therapy Treatment  Patient Details  Name: Curtis Powell MRN: [id number] Date of Birth: 04/24/1965 Referring Provider: Dr. Santayana Gloria Santaya   Encounter Date: 09/24/2017  PT End of Session - 09/24/17 1403    Visit Number  49    Number of Visits  59    Date for PT Re-Evaluation  10/29/17    Authorization Type  5/10    PT Start Time  1355    PT Stop Time  1430    PT Time Calculation (min)  35 min    Equipment Utilized During Treatment  Gait belt    Activity Tolerance  Patient tolerated treatment well;No increased pain    Behavior During Therapy  WFL for tasks assessed/performed       Past Medical History:  Diagnosis Date  . GERD (gastroesophageal reflux disease)   . Hyperlipidemia   . Hypertension   . Obesity   . Stroke (HCC)     Past Surgical History:  Procedure Laterality Date  . BRAIN SURGERY      There were no vitals filed for this visit.  Subjective Assessment - 09/24/17 1402    Subjective  Patient arrived late to session due to ride. Patient reports walking yesterday with caregiver. Reports no stumbles or falls.     Pertinent History   Patient is a pleasant 52 year old male who presents to physical therapy for weakness and immobility secondary to CVA.  Had a stroke in Feb 2018. He was previously fully independent, but this stroke caused severe residual deficits, mainly on the right side as well as speech, and now he is unable to walk or perform most of his ADLs on his own. Entire right side is very weak. He still has a little difficulty with speech but his swallowing is improved to baseline. His mom had to move in with him and now is his main caregiver.     Limitations  Sitting;Lifting;Standing;Walking;House hold activities    Patient Stated Goals  Walk without walker, walk further with walker, get  some strength in back     Currently in Pain?  No/denies     Nustep Lvl 4 LE only for LE strengthening and functional capacity in a non ambulatory position   Step over and back half foam roller: 5x each leg    Seated marching focus on high knees 20x each leg   Side step in // bars with focus on upright posture with BUE support 2x length of bars.   Seated adduction ball squeezes 3 second holds 8x    Ambulate backwards (retropulsion) to increase step length and trunk extension in // bars 2x length of bars, cues for upright posture. CGA with PT behind patient for safety and for patient confidence.      Pt. response to medical necessity:  Patient will continue to benefit from skilled physical therapy to improve strength balance and gait safety                        PT Education - 09/24/17 1403    Education provided  Yes    Education Details  exercise technique     Person(s) Educated  Patient    Methods  Explanation;Demonstration;Verbal cues    Comprehension  Verbalized understanding;Returned demonstration       PT Short Term Goals -   07/16/17 1531      PT SHORT TERM GOAL #1   Title  Patient will be independent in home exercise program to improve strength/mobility for better functional independence with ADLs.    Baseline  hep compliant    Time  2    Period  Weeks    Status  Achieved      PT SHORT TERM GOAL #2   Title  Patient will require min cueing for STS transfer with CGA for increased independence with mobility.     Baseline  CGA    Time  2    Period  Weeks    Status  Achieved      PT SHORT TERM GOAL #3   Title  Patient will maintain upright posture for > 15 seconds to demonstrate strengthened postural control muscualture     Baseline  18 seconds     Time  2    Period  Weeks    Status  Achieved      PT SHORT TERM GOAL #4   Title  Patient will perform         PT Long Term Goals - 09/03/17 1446      PT LONG TERM GOAL #1   Title  Patient  will perform bed mobility with Supervision to increase independent mobility    Baseline  able to perform mod I for increased time , occasional Min A for scooting up in bed    Time  8    Period  Weeks    Status  Partially Met    Target Date  10/29/17      PT LONG TERM GOAL #2   Title  Patient (52 years old) will complete five times sit to stand test in < 10 seconds indicating an increased LE strength and improved balance.    Baseline  2/18: 30 seconds from plinth chair height single UE on seat and single on walker ; 32 seconds from plinth with single UE on seat and single on walker. 4/10: 26 seconds from [facility] with one hand on walker5/29: 22.2 seconds with one hand on walker one hand on WC    Time  8    Period  Weeks    Status  Partially Met    Target Date  10/29/17      PT LONG TERM GOAL #3   Title  Patient will ambulate 40 ft with walker with no breaks to allow for increased mobility within home.     Baseline  12/26: 29 ft with walker and WC follow with no rest breaks; 2/18: multiple trials previous session of >100 ft     Time  8    Period  Weeks    Status  Achieved      PT LONG TERM GOAL #4   Title  Patient will increase BLE gross strength to 4+/5 as to improve functional strength for independent gait, increased standing tolerance and increased ADL ability.    Baseline   4-/5 RLE,     Time  8    Period  Weeks    Status  Partially Met    Target Date  10/29/17      PT LONG TERM GOAL #5   Title  Patient will increase lower extremity functional scale to >40/80 to demonstrate improved functional mobility and increased tolerance with ADLs.     Baseline  14/80; 12/26: 21/80; 2/18: 28/80  4/10; 25/80 5/29: 27/80    Time  8  Period  Weeks    Status  Partially Met    Target Date  10/29/17      PT LONG TERM GOAL #6   Title  Patient will walk through a door involving one small step up/down to increase functional independence.     Baseline  require CGA and min Verbal cueing with BUE  support in // bars at this time    Time  8    Period  Weeks    Status  Partially Met    Target Date  10/29/17      PT LONG TERM GOAL #7   Title  Patient will ambulate 40 ft with least assistive device and Supervision with no breaks to allow for increased mobility within home.     Baseline  requires use of walker     Time  8    Period  Weeks    Status  Partially Met    Target Date  10/29/17      PT LONG TERM GOAL #8   Title  Patient will perform 10 MWT in < 1 minute to demonstrate improved ambulatory velocity and capacity.     Baseline  4/10: 2 minutes 45 seconds; 5/29: 2 minute 31 seconds    Time  8    Period  Weeks    Status  Partially Met    Target Date  10/29/17            Plan - 09/24/17 1428    Clinical Impression Statement  Patient's excessive trunk flexion results in difficulty performing backward propulsion ambulation due to altered center of mass. Patient required frequent cueing for upright posture and hand position to allow for continued posterior propulsion.  Patient will continue to benefit from skilled physical therapy to improve strength balance and gait safety    Rehab Potential  Fair    Clinical Impairments Affecting Rehab Potential  hx of HTN, HLD, CVA, learning disability, Diabetes, brain tumor,     PT Frequency  2x / week    PT Duration  8 weeks    PT Treatment/Interventions  ADLs/Self Care Home Management;Aquatic Therapy;Ultrasound;Moist Heat;Traction;DME Instruction;Gait training;Stair training;Functional mobility training;Therapeutic activities;Therapeutic exercise;Orthotic Fit/Training;Neuromuscular re-education;Balance training;Patient/family education;Manual techniques;Wheelchair mobility training;Passive range of motion;Energy conservation;Taping;Visual/perceptual remediation/compensation    PT Next Visit Plan  floor recovery     PT Home Exercise Plan  see sheet    Consulted and Agree with Plan of Care  Patient;Family member/caregiver       Patient  will benefit from skilled therapeutic intervention in order to improve the following deficits and impairments:  Abnormal gait, Decreased activity tolerance, Decreased balance, Decreased knowledge of precautions, Decreased endurance, Decreased coordination, Decreased knowledge of use of DME, Decreased mobility, Decreased range of motion, Difficulty walking, Decreased safety awareness, Decreased strength, Impaired flexibility, Impaired perceived functional ability, Impaired tone, Postural dysfunction, Improper body mechanics, Pain  Visit Diagnosis: Muscle weakness (generalized)  Other lack of coordination  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 Acute CVA (cerebrovascular accident) (HCC) 05/02/2016  . Left-sided weakness 05/01/2016   Marina Moser, PT, DPT   09/24/2017, 2:30 PM  [facility] [address] [place], [place], [address] Phone: [phone]   Fax:  [phone]  Name: Wyeth L Wehrly MRN: [id number] Date of Birth: 01/03/1966   

## 2017-09-24 NOTE — Therapy (Signed)
[place] MAIN [facility] SERVICES [address] [place], [place], [address] Phone: [phone]   Fax:  [phone]  Occupational Therapy Treatment  Patient Details  Name: Curtis [facility] MRN: [id number] Date of Birth: 11/13/1965 Referring Provider: Dr. Larinda Buttery   Encounter Date: 09/24/2017  OT End of Session - 09/24/17 1454    Visit Number  36    Number of Visits  60    Date for OT Re-Evaluation  10/15/17    Authorization Type  Vist 8 of 10 for progress reporting period starting 07/28/2017    OT Start Time  1430    OT Stop Time  1515    OT Time Calculation (min)  45 min    Activity Tolerance  Patient tolerated treatment well    Behavior During Therapy  [facility] for tasks assessed/performed       Past Medical History:  Diagnosis Date  . GERD (gastroesophageal reflux disease)   . Hyperlipidemia   . Hypertension   . Obesity   . Stroke [facility])     Past Surgical History:  Procedure Laterality Date  . BRAIN SURGERY      There were no vitals filed for this visit.  Subjective Assessment - 09/24/17 1452    Subjective   Pt. his right shoulder is better.    Patient is accompained by:  Family member    Pertinent History  Pt. is a 52 y.o. male who suffered a CVA on 05/01/2016. Pt. was admitted to the hospital. Once discharged, he received Home Health PT and OT services for about a month. Pt. has had multiple CVAs over the past 8 years, and has had multiple falls in the past 6 months. Pt. resides in an apartment. Pt. has caregivers for 80 hours. Pt.'s mother stays with pt. at night, and assists with IADL tasks.      Patient Stated Goals  To be able to throw a ball and dribble a ball.    Currently in Pain?  No/denies      OT TREATMENT    Therapeutic Exercise:  Pt.tolerated AAROM/AROM for the right shoulder flexion abduction, adduction, horizontal abduction. Pt.worked on UE strengthening using red theraband for left shoulder flexion,  abduction, bilateral elbow flexion, and extension.Pt. performed 1.5# dowel ex. For UE strengthening secondary to weakness. Bilateral shoulder flexion, chest press, circular patterns, and elbow flexion/extension were performed.                             OT Education - 09/24/17 1453    Education provided  Yes    Education Details  UE strength, and coordination skills    Person(s) Educated  Patient    Methods  Explanation;Demonstration    Comprehension  Verbalized understanding;Returned demonstration;Verbal cues required;Need further instruction       OT Short Term Goals - 03/03/17 1459      OT SHORT TERM GOAL #1   Title  `        OT Long Term Goals - 07/23/17 1421      OT LONG TERM GOAL #2   Title  Pt. will complete self grooming with minA    Baseline  Pt. continues to require extensive assist. Pt. is improving indedependent with oral care with set-up, however requires extensive assist for self-grooming tasls.    Time  12    Period  Weeks    Status  On-going    Target Date  10/15/17      OT LONG TERM GOAL #3   Title  Pt. will perform self-feeding skills with modified indepndence    Baseline   07/23/2017: Pt. is able to sccop, and spear the food off of the plate, however requires assist for the hand to mouth pattern. Pt. no longer needs to use built-up foam handle for utensils.    Time  12    Period  Weeks    Status  New    Target Date  10/15/17      OT LONG TERM GOAL #4   Title  Pt. will perform self dressing with minA and A/E as needed.    Baseline  07/23/2017: Pt. continues to require extensive assist: Pt. continues to have difficulty perfroming UE,a nd LE dressing.    Time  12    Period  Weeks    Status  On-going    Target Date  10/15/17      OT LONG TERM GOAL #5   Title  Pt. will perform light home making tasks with minA    Baseline  07/23/2017: Pt. continues to have difficulty with folding laundry, washing dishes while standing at the  sinkside. Pt. is able to make a sandwich, and heat things up in th microwave.    Time  12    Period  Weeks    Status  On-going    Target Date  10/15/17      OT LONG TERM GOAL #6   Title  Pt. will write his name efficiently with 100% legibility    Baseline  07/23/2017: 60% legibility for name.    Time  12    Period  Weeks    Status  On-going    Target Date  10/15/17            Plan - 09/24/17 1456    Clinical Impression Statement  Pt. reports that his housing search fell through. Pt. reports he plans to keep looking, as is waiting for his housing voucher to go through. Pt. reports that his right shoulder has improved overall. Pt. tolerated gentle ther. ex to the right shoulder, and was able to initiate dowel exercises without difficulty.    Occupational performance deficits (Please refer to evaluation for details):  ADL's;IADL's    Rehab Potential  Fair    OT Frequency  2x / week    OT Duration  12 weeks    OT Treatment/Interventions  Self-care/ADL training;Patient/family education;DME and/or AE instruction;Therapeutic exercise;Moist Heat;Neuromuscular education    Clinical Decision Making  Several treatment options, min-mod task modification necessary    Consulted and Agree with Plan of Care  Patient       Patient will benefit from skilled therapeutic intervention in order to improve the following deficits and impairments:  Pain, Decreased cognition, Decreased activity tolerance, Impaired UE functional use, Decreased range of motion, Decreased coordination, Decreased endurance  Visit Diagnosis: Muscle weakness (generalized)    Problem List Patient Active Problem List   Diagnosis Date Noted  . Acute CVA (cerebrovascular accident) ([place]) 05/02/2016  . Left-sided weakness 05/01/2016    Harrel Carina, MS, OTR/L 09/24/2017, 3:03 PM  [place] [facility] SERVICES [address] [place], [place], [address] Phone: [phone]   Fax:   [phone]  Name: [facility] MRN: [id number] Date of Birth: December 24, 1965

## 2017-09-29 ENCOUNTER — Encounter: Payer: Self-pay | Admitting: Occupational Therapy

## 2017-09-29 ENCOUNTER — Ambulatory Visit: Payer: Medicare HMO | Admitting: Physical Therapy

## 2017-09-29 ENCOUNTER — Encounter: Payer: Self-pay | Admitting: Physical Therapy

## 2017-09-29 ENCOUNTER — Ambulatory Visit: Payer: Medicare HMO | Admitting: Occupational Therapy

## 2017-09-29 DIAGNOSIS — R2689 Other abnormalities of gait and mobility: Secondary | ICD-10-CM

## 2017-09-29 DIAGNOSIS — R278 Other lack of coordination: Secondary | ICD-10-CM

## 2017-09-29 DIAGNOSIS — M6281 Muscle weakness (generalized): Secondary | ICD-10-CM

## 2017-09-29 DIAGNOSIS — I69351 Hemiplegia and hemiparesis following cerebral infarction affecting right dominant side: Secondary | ICD-10-CM

## 2017-09-29 NOTE — Therapy (Signed)
[place] [facility] SERVICES [address] [place], [place], [address] Phone: [phone]   Fax:  [phone]  Occupational Therapy Treatment  Patient Details  Name: Curtis [facility] MRN: [id number] Date of Birth: 01-Jul-1965 Referring Provider: Dr. Larinda Buttery   Encounter Date: 09/29/2017  OT End of Session - 09/29/17 1442    Visit Number  37    Number of Visits  60    Date for OT Re-Evaluation  10/15/17    Authorization Type  Vist 9 of 10 for progress reporting period starting 07/28/2017    OT Start Time  1430    OT Stop Time  1515    OT Time Calculation (min)  45 min    Activity Tolerance  Patient tolerated treatment well    Behavior During Therapy  [facility] for tasks assessed/performed       Past Medical History:  Diagnosis Date  . GERD (gastroesophageal reflux disease)   . Hyperlipidemia   . Hypertension   . Obesity   . Stroke [facility])     Past Surgical History:  Procedure Laterality Date  . BRAIN SURGERY      There were no vitals filed for this visit.  Subjective Assessment - 09/29/17 1440    Subjective   Pt. reports he found his red theraband at home, and is doing UE exercises at home now.    Patient is accompained by:  Family member    Pertinent History  Pt. is a 52 y.o. male who suffered a CVA on 05/01/2016. Pt. was admitted to the hospital. Once discharged, he received Home Health PT and OT services for about a month. Pt. has had multiple CVAs over the past 8 years, and has had multiple falls in the past 6 months. Pt. resides in an apartment. Pt. has caregivers for 80 hours. Pt.'s mother stays with pt. at night, and assists with IADL tasks.      Patient Stated Goals  To be able to throw a ball and dribble a ball.    Currently in Pain?  No/denies      OT TREATMENT    Neuro muscular re-education:  Pt. performed [facility] tasks using the Grooved pegboard. Pt. worked on grasping the grooved pegs from a horizontal position,  and moving the pegs to a vertical position in the hand to prepare for placing them in the grooved slot. Pt. worked on removing them alternating thumb opposition to the 2nd through 5th digits.  Therapeutic Exercise:  Pt. tolerated PROM in the right shoulder for flexion, extension, abduction, and horizontal abduction/adduction.                       OT Education - 09/29/17 1442    Education provided  Yes    Education Details  UE strength, and coordination skills    Person(s) Educated  Patient    Methods  Explanation;Demonstration    Comprehension  Verbalized understanding;Returned demonstration;Verbal cues required;Need further instruction       OT Short Term Goals - 03/03/17 1459      OT SHORT TERM GOAL #1   Title         OT Long Term Goals - 07/23/17 1421      OT LONG TERM GOAL #2   Title  Pt. will complete self grooming with minA    Baseline  Pt. continues to require extensive assist. Pt. is improving indedependent with oral care with set-up, however requires extensive assist  for self-grooming tasls.    Time  12    Period  Weeks    Status  On-going    Target Date  10/15/17      OT LONG TERM GOAL #3   Title  Pt. will perform self-feeding skills with modified indepndence    Baseline   07/23/2017: Pt. is able to sccop, and spear the food off of the plate, however requires assist for the hand to mouth pattern. Pt. no longer needs to use built-up foam handle for utensils.    Time  12    Period  Weeks    Status  New    Target Date  10/15/17      OT LONG TERM GOAL #4   Title  Pt. will perform self dressing with minA and A/E as needed.    Baseline  07/23/2017: Pt. continues to require extensive assist: Pt. continues to have difficulty perfroming UE,a nd LE dressing.    Time  12    Period  Weeks    Status  On-going    Target Date  10/15/17      OT LONG TERM GOAL #5   Title  Pt. will perform light home making tasks with minA    Baseline  07/23/2017: Pt.  continues to have difficulty with folding laundry, washing dishes while standing at the sinkside. Pt. is able to make a sandwich, and heat things up in th microwave.    Time  12    Period  Weeks    Status  On-going    Target Date  10/15/17      OT LONG TERM GOAL #6   Title  Pt. will write his name efficiently with 100% legibility    Baseline  07/23/2017: 60% legibility for name.    Time  12    Period  Weeks    Status  On-going    Target Date  10/15/17            Plan - 09/29/17 1442    Clinical Impression Statement Pt. continues to present with limited UE strength, and fine motor coordination skills. Pt. tolerated right shoulder ROM without difficulty, or pain. Pt. continues to work on improving UE functioning for improved functional hand use during ADLs, and IADLs.   Occupational performance deficits (Please refer to evaluation for details):  ADL's;IADL's    Rehab Potential  Fair    OT Frequency  2x / week    OT Duration  12 weeks    OT Treatment/Interventions  Self-care/ADL training;Patient/family education;DME and/or AE instruction;Therapeutic exercise;Moist Heat;Neuromuscular education    Clinical Decision Making  Several treatment options, min-mod task modification necessary    Consulted and Agree with Plan of Care  Patient       Patient will benefit from skilled therapeutic intervention in order to improve the following deficits and impairments:  Pain, Decreased cognition, Decreased activity tolerance, Impaired UE functional use, Decreased range of motion, Decreased coordination, Decreased endurance  Visit Diagnosis: Muscle weakness (generalized)  Other lack of coordination    Problem List Patient Active Problem List   Diagnosis Date Noted  . Acute CVA (cerebrovascular accident) ([place]) 05/02/2016  . Left-sided weakness 05/01/2016    Harrel Carina, MS, OTR/L 09/29/2017, 2:58 PM  [place] MAIN [facility] SERVICES [address] [place], [place], [address] Phone: [phone]   Fax:  [phone]  Name: Curtis Powell MRN: [id number] Date of Birth: 09/14/1965

## 2017-09-29 NOTE — Therapy (Signed)
[place] [facility] SERVICES [address] [place], [place], [address] Phone: [phone]   Fax:  [phone]  Physical Therapy Treatment  Patient Details  Name: Curtis Powell MRN: [id number] Date of Birth: 10-Feb-1966 Referring Provider: Dr. Larinda Buttery   Encounter Date: 09/29/2017  PT End of Session - 09/29/17 1430    Visit Number  50    Number of Visits  62    Date for PT Re-Evaluation  10/29/17    Authorization Type  6/10    Authorization - Visit Number  4    Authorization - Number of Visits  10    PT Start Time  2409    PT Stop Time  1427    PT Time Calculation (min)  44 min    Equipment Utilized During Treatment  Gait belt    Activity Tolerance  Patient tolerated treatment well;No increased pain    Behavior During Therapy  WFL for tasks assessed/performed       Past Medical History:  Diagnosis Date  . GERD (gastroesophageal reflux disease)   . Hyperlipidemia   . Hypertension   . Obesity   . Stroke [facility])     Past Surgical History:  Procedure Laterality Date  . BRAIN SURGERY      There were no vitals filed for this visit.  Subjective Assessment - 09/29/17 1352    Subjective  Pt reports he is doing well this session, HEP going well at home, no pain to report, no recent meds changes. Pt wants to progress AMB with rollator at home with caregiver.     Pertinent History   Patient is a pleasant 52 year old male who presents to physical therapy for weakness and immobility secondary to CVA.  Had a stroke in Feb 2018. He was previously fully independent, but this stroke caused severe residual deficits, mainly on the right side as well as speech, and now he is unable to walk or perform most of his ADLs on his own. Entire right side is very weak. He still has a little difficulty with speech but his swallowing is improved to baseline. His mom had to move in with him and now is his main caregiver.     Currently in Pain?   No/denies           -Nustep Lvl 3, 5 minutes (unable to tolerate level 4 this date) LE only for LE strengthening and functional capacity in a non ambulatory position. Seat in #7.   -Gait training with rolator walker: 1x17f, 2x377f consistent gait speed of 0.04153meach bout, consistent mechanics until end of distance. Very little hip flexion with trunk lateral flexion for compensation in leg swing.  -STS transfers x6 MinGuard assist to CGA -Stand Pivot transfers x3 for Nustep and toileting. Mingaurd to mina     Pt. response to medical necessity:  Patient will continue to benefit from skilled physical therapy to improve strength balance and gait safety        PT Short Term Goals - 07/16/17 1531      PT SHORT TERM GOAL #1   Title  Patient will be independent in home exercise program to improve strength/mobility for better functional independence with ADLs.    Baseline  hep compliant    Time  2    Period  Weeks    Status  Achieved      PT SHORT TERM GOAL #2   Title  Patient will require min cueing for  STS transfer with CGA for increased independence with mobility.     Baseline  CGA    Time  2    Period  Weeks    Status  Achieved      PT SHORT TERM GOAL #3   Title  Patient will maintain upright posture for > 15 seconds to demonstrate strengthened postural control muscualture     Baseline  18 seconds     Time  2    Period  Weeks    Status  Achieved      PT SHORT TERM GOAL #4   Title  Patient will perform         PT Long Term Goals - 09/03/17 1446      PT LONG TERM GOAL #1   Title  Patient will perform bed mobility with Supervision to increase independent mobility    Baseline  able to perform mod I for increased time , occasional Min A for scooting up in bed    Time  8    Period  Weeks    Status  Partially Met    Target Date  10/29/17      PT LONG TERM GOAL #2   Title  Patient (< 42 years old) will complete five times sit to stand test in < 10 seconds  indicating an increased LE strength and improved balance.    Baseline  2/18: 30 seconds from plinth chair height single UE on seat and single on walker ; 32 seconds from plinth with single UE on seat and single on walker. 4/10: 26 seconds from [facility] with one hand on walker5/29: 22.2 seconds with one hand on walker one hand on WC    Time  8    Period  Weeks    Status  Partially Met    Target Date  10/29/17      PT LONG TERM GOAL #3   Title  Patient will ambulate 40 ft with walker with no breaks to allow for increased mobility within home.     Baseline  12/26: 29 ft with walker and WC follow with no rest breaks; 2/18: multiple trials previous session of >100 ft     Time  8    Period  Weeks    Status  Achieved      PT LONG TERM GOAL #4   Title  Patient will increase BLE gross strength to 4+/5 as to improve functional strength for independent gait, increased standing tolerance and increased ADL ability.    Baseline   4-/5 RLE,     Time  8    Period  Weeks    Status  Partially Met    Target Date  10/29/17      PT LONG TERM GOAL #5   Title  Patient will increase lower extremity functional scale to >40/80 to demonstrate improved functional mobility and increased tolerance with ADLs.     Baseline  14/80; 12/26: 21/80; 2/18: 28/80  4/10; 25/80 5/29: 27/80    Time  8    Period  Weeks    Status  Partially Met    Target Date  10/29/17      PT LONG TERM GOAL #6   Title  Patient will walk through a door involving one small step up/down to increase functional independence.     Baseline  require CGA and min Verbal cueing with BUE support in // bars at this time    Time  8    Period  Weeks    Status  Partially Met    Target Date  10/29/17      PT LONG TERM GOAL #7   Title  Patient will ambulate 40 ft with least assistive device and Supervision with no breaks to allow for increased mobility within home.     Baseline  requires use of walker     Time  8    Period  Weeks    Status  Partially Met     Target Date  10/29/17      PT LONG TERM GOAL #8   Title  Patient will perform 10 MWT in < 1 minute to demonstrate improved ambulatory velocity and capacity.     Baseline  4/10: 2 minutes 45 seconds; 5/29: 2 minute 31 seconds    Time  8    Period  Weeks    Status  Partially Met    Target Date  10/29/17            Plan - 09/29/17 1431    Clinical Impression Statement  Pt tolerating session well today. COntinues to focusheavily on AMB capacity, performing 3 concerted 5 minutes bouts this session with rollator. Adjustment ismade to handle height, which improves mechanics and distanc,e but is still too tall for patient's short stature. Pt progresing well overall, but tolerating less resistance on NuStep thissession. Pt assisted with tolietting and cleanup at end of session.     Clinical Impairments Affecting Rehab Potential  hx of HTN, HLD, CVA, learning disability, Diabetes, brain tumor,     PT Frequency  2x / week    PT Duration  8 weeks    PT Treatment/Interventions  ADLs/Self Care Home Management;Aquatic Therapy;Ultrasound;Moist Heat;Traction;DME Instruction;Gait training;Stair training;Functional mobility training;Therapeutic activities;Therapeutic exercise;Orthotic Fit/Training;Neuromuscular re-education;Balance training;Patient/family education;Manual techniques;Wheelchair mobility training;Passive range of motion;Energy conservation;Taping;Visual/perceptual remediation/compensation    PT Next Visit Plan  floor recovery     Consulted and Agree with Plan of Care  Patient       Patient will benefit from skilled therapeutic intervention in order to improve the following deficits and impairments:  Abnormal gait, Decreased activity tolerance, Decreased balance, Decreased knowledge of precautions, Decreased endurance, Decreased coordination, Decreased knowledge of use of DME, Decreased mobility, Decreased range of motion, Difficulty walking, Decreased safety awareness, Decreased strength,  Impaired flexibility, Impaired perceived functional ability, Impaired tone, Postural dysfunction, Improper body mechanics, Pain  Visit Diagnosis: Muscle weakness (generalized)  Other lack of coordination  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 Acute CVA (cerebrovascular accident) ([place]) 05/02/2016  . Left-sided weakness 05/01/2016   2:34 PM, 09/29/17 Etta Grandchild, PT, DPT Physical Therapist - [place] [phone]     Etta Grandchild 09/29/2017, 2:34 PM  [place] MA[facility] SERVICES [address] [place], [place], [address] Phone: [phone]   Fax:  [phone]  Name: Curtis Powell MRN: [id number] Date of Birth: March 26, 1966

## 2017-10-01 ENCOUNTER — Ambulatory Visit: Payer: Medicare HMO | Admitting: Occupational Therapy

## 2017-10-01 ENCOUNTER — Ambulatory Visit: Payer: Medicare HMO

## 2017-10-01 ENCOUNTER — Encounter: Payer: Self-pay | Admitting: Occupational Therapy

## 2017-10-01 DIAGNOSIS — M6281 Muscle weakness (generalized): Secondary | ICD-10-CM | POA: Diagnosis not present

## 2017-10-01 DIAGNOSIS — R2689 Other abnormalities of gait and mobility: Secondary | ICD-10-CM

## 2017-10-01 DIAGNOSIS — I69351 Hemiplegia and hemiparesis following cerebral infarction affecting right dominant side: Secondary | ICD-10-CM

## 2017-10-01 DIAGNOSIS — R278 Other lack of coordination: Secondary | ICD-10-CM

## 2017-10-01 NOTE — Therapy (Addendum)
[place] MA[facility] SERVICES [address] [place], [place], [address] Phone: [phone]   Fax:  [phone]  Occupational Therapy Progress Note  Dates of reporting period 07/28/2017   to 10/01/2017   Patient Details  Name: Curtis Powell MRN: [id number] Date of Birth: 11-Nov-1965 Referring Provider: Dr. Larinda Buttery   Encounter Date: 10/01/2017  OT End of Session - 10/01/17 1438    Visit Number  38    Number of Visits  60    Date for OT Re-Evaluation  10/15/17    Authorization Type  Vist 10 of 10 for progress reporting period starting 07/28/2017    OT Start Time  1430    OT Stop Time  1515    OT Time Calculation (min)  45 min    Activity Tolerance  Patient tolerated treatment well    Behavior During Therapy  [facility] for tasks assessed/performed       Past Medical History:  Diagnosis Date  . GERD (gastroesophageal reflux disease)   . Hyperlipidemia   . Hypertension   . Obesity   . Stroke [facility])     Past Surgical History:  Procedure Laterality Date  . BRAIN SURGERY      There were no vitals filed for this visit.  Subjective Assessment - 10/01/17 1436    Subjective   Pt. reports that he continues to present with limited UE strength, and fine motor coordination skills.     Patient is accompained by:  Family member    Pertinent History  Pt. is a 52 y.o. male who suffered a CVA on 05/01/2016. Pt. was admitted to the hospital. Once discharged, he received Home Health PT and OT services for about a month. Pt. has had multiple CVAs over the past 8 years, and has had multiple falls in the past 6 months. Pt. resides in an apartment. Pt. has caregivers for 80 hours. Pt.'s mother stays with pt. at night, and assists with IADL tasks.      Patient Stated Goals  To be able to throw a ball and dribble a ball.    Currently in Pain?  No/denies      OT TREATMENT    Neuro muscular re-education:  Pt. worked on grasping at 1/2" washers, and  with the tip of his 2nd digit to his thumb at placing them on vertical dowels. Pt. worked on alternating thumb opposition to the tip of his 2nd through 5th digits. Pt. worked on [employer] the washers. Pt. worked on grasping one inch resistive cubes alternating thumb opposition to the tip of the 2nd through 5th digits. The board was positioned flat on the tabletop surface. Pt. worked on pressing them back into place while isolating 2nd through 5th digits. Pt. worked on grasping 1/8" pegs with long-nosed tweezers. Pt. Worked on controlling the the tweezers when placing the pegs onto the board.                       OT Education - 10/01/17 1438    Education provided  Yes    Education Details  UE strength, and coordination skills    Person(s) Educated  Patient    Methods  Explanation;Demonstration    Comprehension  Verbalized understanding;Returned demonstration;Verbal cues required;Need further instruction       OT Short Term Goals - 03/03/17 1459      OT SHORT TERM GOAL #1   Title  `  OT Long Term Goals - 10/01/17 1449      OT LONG TERM GOAL #1   Title  Pt. will UE functioning to be able to perfrom toilet hygiene with minimal assistance.    Baseline  Pt. is unable. 05/07/2017: Pt. continues to occassionally have difficulty.    Time  12    Period  Weeks    Status  On-going    Target Date  10/15/17      OT LONG TERM GOAL #2   Title  Pt. will complete self grooming with minA    Baseline  Pt. continues to require extensive assist. Pt. is improving indedependent with oral care with set-up, however requires extensive assist for self-grooming tasls.    Time  12    Period  Weeks    Status  On-going    Target Date  10/15/17      OT LONG TERM GOAL #3   Title  Pt. will perform self-feeding skills with modified indepndence    Baseline   Pt. is able to sccop, and spear the food off of the plate, however requires assist for the hand to mouth pattern. Pt. no longer  needs to use built-up foam handle for utensils.    Time  12    Period  Weeks    Status  On-going    Target Date  10/15/17      OT LONG TERM GOAL #4   Title  Pt. will perform self dressing with minA and A/E as needed.    Baseline  Pt. continues to require extensive assist: Pt. continues to have difficulty perfroming UE,a nd LE dressing.    Time  12    Period  Weeks    Status  On-going    Target Date  10/15/17      OT LONG TERM GOAL #5   Title  Pt. will perform light home making tasks with minA    Baseline  Pt. continues to have difficulty with folding laundry, washing dishes while standing at the sinkside. Pt. is able to make a sandwich, and heat things up in th microwave.    Time  12    Period  Weeks    Status  On-going    Target Date  10/15/17      OT LONG TERM GOAL #6   Title  Pt. will write his name efficiently with 100% legibility    Baseline   60% legibility for name.    Time  12    Period  Weeks    Status  On-going    Target Date  10/15/17            Plan - 10/01/17 1439    Clinical Impression Statement Pt.'s reports that his right shoulder has improved overall. Pt. continues to present with limited UE strength, and fine motor coordination skills. Pt. continues to work towards the addressed goals to improve ADL functioning.    Occupational performance deficits (Please refer to evaluation for details):  ADL's;IADL's    Rehab Potential  Fair    OT Frequency  2x / week    OT Duration  12 weeks    OT Treatment/Interventions  Self-care/ADL training;Patient/family education;DME and/or AE instruction;Therapeutic exercise;Moist Heat;Neuromuscular education    Clinical Decision Making  Several treatment options, min-mod task modification necessary    Consulted and Agree with Plan of Care  Patient       Patient will benefit from skilled therapeutic intervention in order to improve the following deficits  and impairments:  Pain, Decreased cognition, Decreased activity  tolerance, Impaired UE functional use, Decreased range of motion, Decreased coordination, Decreased endurance  Visit Diagnosis: Muscle weakness (generalized)    Problem List Patient Active Problem List   Diagnosis Date Noted  . Acute CVA (cerebrovascular accident) ([place]) 05/02/2016  . Left-sided weakness 05/01/2016    Harrel Carina, MS, OTR/L 10/01/2017, 2:52 PM  [place] [facility] SERVICES [address] [place], [place], [address] Phone: [phone]   Fax:  [phone]  Name: Curtis Powell MRN: [id number] Date of Birth: [birth year]

## 2017-10-01 NOTE — Therapy (Signed)
[place] MAIN [facility] SERVICES [address] [place], [place], [address] Phone: [phone]   Fax:  [phone]  Physical Therapy Treatment  Patient Details  Name: Curtis Powell MRN: [id number] Date of Birth: Jan 21, 1966 Referring Provider: Dr. Larinda Buttery   Encounter Date: 10/01/2017  PT End of Session - 10/01/17 1427    Visit Number  22    Number of Visits  68    Date for PT Re-Evaluation  10/29/17    Authorization Type  7/10    Authorization - Visit Number  5    Authorization - Number of Visits  10    PT Start Time  3888    PT Stop Time  1430    PT Time Calculation (min)  43 min    Equipment Utilized During Treatment  Gait belt    Activity Tolerance  Patient tolerated treatment well;No increased pain    Behavior During Therapy  WFL for tasks assessed/performed       Past Medical History:  Diagnosis Date  . GERD (gastroesophageal reflux disease)   . Hyperlipidemia   . Hypertension   . Obesity   . Stroke [facility])     Past Surgical History:  Procedure Laterality Date  . BRAIN SURGERY      There were no vitals filed for this visit.  Subjective Assessment - 10/01/17 1422    Subjective  Patient reports caregiver wants to directions on how to ambulate with rollator at home.. WIll give to patient today (written instructions) No falls or LOB since last session.     Pertinent History   Patient is a pleasant 52 year old male who presents to physical therapy for weakness and immobility secondary to CVA.  Had a stroke in Feb 2018. He was previously fully independent, but this stroke caused severe residual deficits, mainly on the right side as well as speech, and now he is unable to walk or perform most of his ADLs on his own. Entire right side is very weak. He still has a little difficulty with speech but his swallowing is improved to baseline. His mom had to move in with him and now is his main caregiver.     Limitations   Sitting;Lifting;Standing;Walking;House hold activities    How long can you sit comfortably?  5 minutes    How long can you stand comfortably?  5 minutes    How long can you walk comfortably?  10 ft    Patient Stated Goals  Walk without walker, walk further with walker, get some strength in back     Currently in Pain?  No/denies      Access Code: [email or web address]  URL: [name]  Date: 10/01/2017  Prepared by: Janna Arch   Patient Education    Access Code: [email or web address]  URL: [email or web address]  Date: 10/01/2017  Prepared by: Janna Arch   Patient Education   Nustep Lvl 4 LE only 3 minutes cues for >60 rpm for cardiovascular challenge     Ambulate49f in hallway withrollater andCGA.; cues for upright posture, increased hip flexion. Occasional buckling of RLE and hyperextension of R knee.  Patient educated on how to use rollator with proper posture and UE reliance: see above sheet (per locking unlocking, step length etc)   Seated abduction: single leg at a time: GTB 15x each leg.   Seated marching focus on high knees 20x each leg: GTB around legs cues for not leaning back against chair to allow abdominal activaiton.  Seated adduction squeezes 12x 5 second holds   Pt. response to medical necessity: Patient will continue to benefit from skilled physical therapy to improve strength balance and gait safety                    PT Education - 10/01/17 1423    Education provided  Yes    Education Details  exercise technique, rollator locking /unlocking/ ambulating     Person(s) Educated  Patient    Methods  Explanation;Demonstration;Verbal cues    Comprehension  Verbalized understanding;Returned demonstration       PT Short Term Goals - 07/16/17 1531      PT SHORT TERM GOAL #1   Title  Patient will be independent in home exercise program to improve strength/mobility for better functional independence with ADLs.     Baseline  hep compliant    Time  2    Period  Weeks    Status  Achieved      PT SHORT TERM GOAL #2   Title  Patient will require min cueing for STS transfer with CGA for increased independence with mobility.     Baseline  CGA    Time  2    Period  Weeks    Status  Achieved      PT SHORT TERM GOAL #3   Title  Patient will maintain upright posture for > 15 seconds to demonstrate strengthened postural control muscualture     Baseline  18 seconds     Time  2    Period  Weeks    Status  Achieved      PT SHORT TERM GOAL #4   Title  Patient will perform         PT Long Term Goals - 09/03/17 1446      PT LONG TERM GOAL #1   Title  Patient will perform bed mobility with Supervision to increase independent mobility    Baseline  able to perform mod I for increased time , occasional Min A for scooting up in bed    Time  8    Period  Weeks    Status  Partially Met    Target Date  10/29/17      PT LONG TERM GOAL #2   Title  Patient (< 67 years old) will complete five times sit to stand test in < 10 seconds indicating an increased LE strength and improved balance.    Baseline  2/18: 30 seconds from plinth chair height single UE on seat and single on walker ; 32 seconds from plinth with single UE on seat and single on walker. 4/10: 26 seconds from [facility] with one hand on walker5/29: 22.2 seconds with one hand on walker one hand on WC    Time  8    Period  Weeks    Status  Partially Met    Target Date  10/29/17      PT LONG TERM GOAL #3   Title  Patient will ambulate 40 ft with walker with no breaks to allow for increased mobility within home.     Baseline  12/26: 29 ft with walker and WC follow with no rest breaks; 2/18: multiple trials previous session of >100 ft     Time  8    Period  Weeks    Status  Achieved      PT LONG TERM GOAL #4   Title  Patient will increase BLE gross strength to 4+/5 as  to improve functional strength for independent gait, increased standing tolerance and  increased ADL ability.    Baseline   4-/5 RLE,     Time  8    Period  Weeks    Status  Partially Met    Target Date  10/29/17      PT LONG TERM GOAL #5   Title  Patient will increase lower extremity functional scale to >40/80 to demonstrate improved functional mobility and increased tolerance with ADLs.     Baseline  14/80; 12/26: 21/80; 2/18: 28/80  4/10; 25/80 5/29: 27/80    Time  8    Period  Weeks    Status  Partially Met    Target Date  10/29/17      PT LONG TERM GOAL #6   Title  Patient will walk through a door involving one small step up/down to increase functional independence.     Baseline  require CGA and min Verbal cueing with BUE support in // bars at this time    Time  8    Period  Weeks    Status  Partially Met    Target Date  10/29/17      PT LONG TERM GOAL #7   Title  Patient will ambulate 40 ft with least assistive device and Supervision with no breaks to allow for increased mobility within home.     Baseline  requires use of walker     Time  8    Period  Weeks    Status  Partially Met    Target Date  10/29/17      PT LONG TERM GOAL #8   Title  Patient will perform 10 MWT in < 1 minute to demonstrate improved ambulatory velocity and capacity.     Baseline  4/10: 2 minutes 45 seconds; 5/29: 2 minute 31 seconds    Time  8    Period  Weeks    Status  Partially Met    Target Date  10/29/17            Plan - 10/01/17 1428    Clinical Impression Statement  Patient fatigued with ambulating with rollator due to decreased ability to rely solely on UE strength. Patient demonstrates improved forward momentum with rollator than RW and was given paperwork for caregiver on how to perform at home. Patient continues to improve with strengthening of LE's in standing and seated positioning. Patient will continue to benefit from skilled physical therapy to improve strength balance and gait safety    Clinical Impairments Affecting Rehab Potential  hx of HTN, HLD, CVA,  learning disability, Diabetes, brain tumor,     PT Frequency  2x / week    PT Duration  8 weeks    PT Treatment/Interventions  ADLs/Self Care Home Management;Aquatic Therapy;Ultrasound;Moist Heat;Traction;DME Instruction;Gait training;Stair training;Functional mobility training;Therapeutic activities;Therapeutic exercise;Orthotic Fit/Training;Neuromuscular re-education;Balance training;Patient/family education;Manual techniques;Wheelchair mobility training;Passive range of motion;Energy conservation;Taping;Visual/perceptual remediation/compensation    PT Next Visit Plan  floor recovery     Consulted and Agree with Plan of Care  Patient       Patient will benefit from skilled therapeutic intervention in order to improve the following deficits and impairments:  Abnormal gait, Decreased activity tolerance, Decreased balance, Decreased knowledge of precautions, Decreased endurance, Decreased coordination, Decreased knowledge of use of DME, Decreased mobility, Decreased range of motion, Difficulty walking, Decreased safety awareness, Decreased strength, Impaired flexibility, Impaired perceived functional ability, Impaired tone, Postural dysfunction, Improper body mechanics, Pain  Visit Diagnosis: Muscle  weakness (generalized)  Other lack of coordination  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 Acute CVA (cerebrovascular accident) ([place]) 05/02/2016  . Left-sided weakness 05/01/2016   Janna Arch, PT, DPT   10/01/2017, 2:31 PM  [place] MAIN [facility] SERVICES [address] [place], [place], [address] Phone: [phone]   Fax:  [phone]  Name: Curtis Powell MRN: [id number] Date of Birth: 03-25-1966

## 2017-10-05 ENCOUNTER — Emergency Department
Admission: EM | Admit: 2017-10-05 | Discharge: 2017-10-05 | Disposition: A | Discharge status: Home / Self Care | Payer: Medicare HMO | Attending: Emergency Medicine | Admitting: Emergency Medicine

## 2017-10-05 ENCOUNTER — Encounter: Payer: Self-pay | Admitting: Emergency Medicine

## 2017-10-05 ENCOUNTER — Other Ambulatory Visit: Payer: Self-pay

## 2017-10-05 ENCOUNTER — Emergency Department: Payer: Medicare HMO

## 2017-10-05 DIAGNOSIS — Z7902 Long term (current) use of antithrombotics/antiplatelets: Secondary | ICD-10-CM | POA: Diagnosis not present

## 2017-10-05 DIAGNOSIS — I10 Essential (primary) hypertension: Secondary | ICD-10-CM | POA: Diagnosis not present

## 2017-10-05 DIAGNOSIS — R1031 Right lower quadrant pain: Secondary | ICD-10-CM | POA: Insufficient documentation

## 2017-10-05 DIAGNOSIS — Z8673 Personal history of transient ischemic attack (TIA), and cerebral infarction without residual deficits: Secondary | ICD-10-CM | POA: Diagnosis not present

## 2017-10-05 DIAGNOSIS — Z79899 Other long term (current) drug therapy: Secondary | ICD-10-CM | POA: Diagnosis not present

## 2017-10-05 LAB — URINALYSIS, COMPLETE (UACMP) WITH MICROSCOPIC
Bacteria, UA: NONE SEEN
Bilirubin Urine: NEGATIVE
Glucose, UA: NEGATIVE mg/dL
Hgb urine dipstick: NEGATIVE
Ketones, ur: NEGATIVE mg/dL
Leukocytes, UA: NEGATIVE
Nitrite: NEGATIVE
Protein, ur: NEGATIVE mg/dL
Specific Gravity, Urine: 1.012 (ref 1.005–1.030)
Squamous Epithelial / LPF: NONE SEEN (ref 0–5)
pH: 8 (ref 5.0–8.0)

## 2017-10-05 MED ORDER — TRAMADOL HCL 50 MG PO TABS
50.0000 mg | ORAL_TABLET | Freq: Once | ORAL | Status: AC
  Administered 2017-10-05: 50 mg via ORAL
  Filled 2017-10-05: qty 1

## 2017-10-05 MED ORDER — TRAMADOL HCL 50 MG PO TABS: 50.0000 mg | ORAL_TABLET | Freq: Four times a day (QID) | ORAL | 0 refills | Status: AC | PRN

## 2017-10-05 NOTE — ED Triage Notes (Signed)
Pt presents to ED via [facility] from home with c/o groin pain. Per EMS pt had a stroke approx 1 yr ago. Per EMS pt c/o R groin pain that started after phsyical therapy with pain radiating down the R leg. EMS reports pt's mother reports "white floating" stuff in his urine.

## 2017-10-05 NOTE — Discharge Instructions (Addendum)
Return to the ER for new, worsening, persistent severe pain, weakness or numbness, urinary symptoms, fevers, or any other new or worsening symptoms that concern you.  Follow-up with your regular doctor.  You may continue physical therapy.

## 2017-10-05 NOTE — ED Notes (Signed)
Patient transported to X-ray 

## 2017-10-05 NOTE — ED Provider Notes (Signed)
[facility] Emergency Department Provider Note ____________________________________________   First MD Initiated Contact with Patient 10/05/17 1016     (approximate)  I have reviewed the triage vital signs and the nursing notes.   HISTORY  Chief Complaint Groin Pain    HPI Curtis Powell is a 52 y.o. male with PMH as noted below who presents with pain to the right groin over the last 3 days, relatively acute onset, constant, worse with movement, and not associated with swelling.  Patient states the pain radiates down the right leg.  He denies any trauma or injury but states that started when he was lifting the right leg.  He denies any numbness or weakness.  [name] also reports that the patient has had some apparent white debris in his urine.  The patient denies dysuria or abdominal pain.  Past Medical History:  Diagnosis Date  . GERD (gastroesophageal reflux disease)   . Hyperlipidemia   . Hypertension   . Obesity   . Stroke [facility])     Patient Active Problem List   Diagnosis Date Noted  . Acute CVA (cerebrovascular accident) ([place]) 05/02/2016  . Left-sided weakness 05/01/2016    Past Surgical History:  Procedure Laterality Date  . BRAIN SURGERY      Prior to Admission medications   Medication Sig Start Date End Date Taking? Authorizing Provider  atorvastatin (LIPITOR) 20 MG tablet Take 1 tablet by mouth daily. 04/11/17   [provider]  clopidogrel (PLAVIX) 75 MG tablet Take 1 tablet (75 mg total) by mouth daily. 05/03/16   Vaughan Basta, MD  cyclobenzaprine (FLEXERIL) 10 MG tablet Take 1 tablet (10 mg total) by mouth every 8 (eight) hours as needed for muscle spasms. Patient taking differently: Take 5 mg by mouth daily as needed for muscle spasms.  01/03/15   Sable Feil, PA-C  multivitamin-iron-minerals-folic acid (CENTRUM) chewable tablet Chew 1 tablet by mouth daily.    [provider]  omeprazole  (PRILOSEC) 20 MG capsule Take 20 mg by mouth daily. 01/09/15   [provider]  polyethylene glycol (MIRALAX / GLYCOLAX) packet Take 17 g by mouth daily. 04/28/17   Arta Silence, MD  senna-docusate (SENOKOT-S) 8.6-50 MG tablet Take 1 tablet by mouth at bedtime as needed for mild constipation. 05/03/16   Vaughan Basta, MD  traMADol (ULTRAM) 50 MG tablet Take 1 tablet (50 mg total) by mouth every 6 (six) hours as needed for up to 5 days for severe pain. 10/05/17 10/10/17  Arta Silence, MD    Allergies Patient has no known allergies.  Family History  Problem Relation Age of Onset  . Stroke Mother   . Hypertension Mother     Social History Social History   Tobacco Use  . Smoking status: Never Smoker  . Smokeless tobacco: Never Used  Substance Use Topics  . Alcohol use: No  . Drug use: No    Review of Systems  Constitutional: No fever. Eyes: No redness. ENT: No sore throat. Cardiovascular: Denies chest pain. Respiratory: Denies shortness of breath. Gastrointestinal: No abdominal pain.  Genitourinary: Negative for dysuria.  Musculoskeletal: Negative for back pain. Skin: Negative for rash or swelling. Neurological: Negative for headache.  No weakness or numbness.   ____________________________________________   PHYSICAL EXAM:  VITAL SIGNS: ED Triage Vitals  Enc Vitals Group     BP 10/05/17 1012 125/76     Pulse Rate 10/05/17 1012 88     Resp 10/05/17 1012 18  Temp 10/05/17 1012 98.4 F (36.9 C)     Temp Source 10/05/17 1012 Oral     SpO2 10/05/17 1012 98 %     Weight 10/05/17 1010 218 lb 0.6 oz (98.9 kg)     Height 10/05/17 1010 4\' 11"  (1.499 m)     Head Circumference --      Peak Flow --      Pain Score 10/05/17 1010 10     Pain Loc --      Pain Edu? --      Excl. in [place]? --     Constitutional: Alert and oriented. Well appearing and in no acute distress. Eyes: Conjunctivae are normal.  Head: Atraumatic. Nose: No  congestion/rhinnorhea. Mouth/Throat: Mucous membranes are moist.   Neck: Normal range of motion.  Cardiovascular:  Good peripheral circulation. Respiratory: Normal respiratory effort.  Gastrointestinal: Soft and nontender. No distention.  Genitourinary: No flank tenderness. Musculoskeletal: Extremities warm and well perfused.  Right inguinal area with no tenderness, no palpable mass, and no swelling. Neurologic:  Normal speech and language. No gross focal neurologic deficits are appreciated.  5/5 motor strength to right lower extremity.  FROM at right hip. Skin:  Skin is warm and dry. No rash noted. Psychiatric: Mood and affect are normal. Speech and behavior are normal.  ____________________________________________   LABS (all labs ordered are listed, but only abnormal results are displayed)  Labs Reviewed  URINALYSIS, COMPLETE (UACMP) WITH MICROSCOPIC - Abnormal; Notable for the following components:      Result Value   Color, Urine YELLOW (*)    APPearance CLEAR (*)    All other components within normal limits   ____________________________________________  EKG  ED ECG REPORT I, Arta Silence, the attending physician, personally viewed and interpreted this ECG.  Date: 10/05/2017 EKG Time: 1020 Rate: 86 Rhythm: normal sinus rhythm QRS Axis: normal Intervals: normal ST/T Wave abnormalities: normal Narrative Interpretation: no evidence of acute ischemia  ____________________________________________  RADIOLOGY  XR pelvis: No fracture or other acute findings  ____________________________________________   PROCEDURES  Procedure(s) performed: No  Procedures  Critical Care performed: No ____________________________________________   INITIAL IMPRESSION / ASSESSMENT AND PLAN / ED COURSE  Pertinent labs & imaging results that were available during my care of the patient were reviewed by me and considered in my medical decision making (see chart for  details).  52 year old male with PMH as noted above including left-sided motor deficit from prior stroke presents with right inguinal area pain over the last several days with no significant associated symptoms.  [name] also reports some whitish debris in his urine, however the patient has no urinary symptoms.  On exam, the patient is comfortable appearing, vital signs are normal, and the exam is unremarkable.  There are no cutaneous findings, no palpable mass or swelling in the area where the pain is located, the right lower extremity is neuro/vascular intact, and he has full range of motion at the right hip.  Overall I suspect most likely muscle strain or spasm.  Also consider radiculopathy, or sciatica.  I will obtain a pelvic x-ray to rule out any bony trauma, and will obtain a UA.  EKG was performed on patient arrival for unclear indications, since he has no cardiopulmonary symptoms, but it is normal.  ----------------------------------------- 12:54 PM on 10/05/2017 -----------------------------------------  Urinalysis shows some RBCs and WBCs, but no nitrates, leukocytes, or other acute findings.  Especially given that the patient has no dysuria, suprapubic pain, fever, or other symptoms suggestive  of UTI, this finding does not appear to be of clinical significance.  X-ray shows no acute findings.  Patient feels well to go home.  I suspect most likely abductor muscle strain.  Return precautions given, and the patient expressed understanding.  ____________________________________________   FINAL CLINICAL IMPRESSION(S) / ED DIAGNOSES  Final diagnoses:  Right groin pain      NEW MEDICATIONS STARTED DURING THIS VISIT:  New Prescriptions   TRAMADOL (ULTRAM) 50 MG TABLET    Take 1 tablet (50 mg total) by mouth every 6 (six) hours as needed for up to 5 days for severe pain.     Note:  This document was prepared using Dragon voice recognition software and may include  unintentional dictation errors.    Arta Silence, MD 10/05/17 1256

## 2017-10-07 ENCOUNTER — Ambulatory Visit: Payer: Medicare HMO | Admitting: Occupational Therapy

## 2017-10-07 ENCOUNTER — Encounter: Payer: Self-pay | Admitting: Occupational Therapy

## 2017-10-07 ENCOUNTER — Ambulatory Visit: Payer: Medicare HMO | Attending: Family Medicine | Admitting: Physical Therapy

## 2017-10-07 ENCOUNTER — Encounter: Payer: Self-pay | Admitting: Physical Therapy

## 2017-10-07 VITALS — BP 123/79 | HR 78

## 2017-10-07 DIAGNOSIS — R2689 Other abnormalities of gait and mobility: Secondary | ICD-10-CM

## 2017-10-07 DIAGNOSIS — M6281 Muscle weakness (generalized): Secondary | ICD-10-CM

## 2017-10-07 DIAGNOSIS — I69351 Hemiplegia and hemiparesis following cerebral infarction affecting right dominant side: Secondary | ICD-10-CM | POA: Insufficient documentation

## 2017-10-07 DIAGNOSIS — R278 Other lack of coordination: Secondary | ICD-10-CM | POA: Diagnosis present

## 2017-10-07 NOTE — Therapy (Signed)
[place] MAIN [facility] SERVICES [address] [place], [place], [address] Phone: [phone]   Fax:  [phone]  Occupational Therapy Treatment  Patient Details  Name: Curtis Powell MRN: [id number] Date of Birth: 08/09/1965 Referring Provider: Dr. Larinda Buttery   Encounter Date: 10/07/2017  OT End of Session - 10/07/17 1451    Visit Number  39    Number of Visits  60    Date for OT Re-Evaluation  10/15/17    Authorization Type  Vist 1 of 10 for progress reporting period starting 10/07/2017    OT Start Time  1430    OT Stop Time  1515    OT Time Calculation (min)  45 min    Activity Tolerance  Patient tolerated treatment well    Behavior During Therapy  [facility] for tasks assessed/performed       Past Medical History:  Diagnosis Date  . GERD (gastroesophageal reflux disease)   . Hyperlipidemia   . Hypertension   . Obesity   . Stroke [facility])     Past Surgical History:  Procedure Laterality Date  . BRAIN SURGERY      There were no vitals filed for this visit.  Subjective Assessment - 10/07/17 1447    Subjective   Pt. reports he has had right sided groin pain.     Patient is accompained by:  Family member    Pertinent History  Pt. is a 52 y.o. male who suffered a CVA on 05/01/2016. Pt. was admitted to the hospital. Once discharged, he received Home Health PT and OT services for about a month. Pt. has had multiple CVAs over the past 8 years, and has had multiple falls in the past 6 months. Pt. resides in an apartment. Pt. has caregivers for 80 hours. Pt.'s mother stays with pt. at night, and assists with IADL tasks.      Patient Stated Goals  To be able to throw a ball and dribble a ball.    Pain Score  5     Pain Location  Groin      OT TREATMENT    Neuro muscular re-education:  Pt. worked on improving BUE strength, and fine motor coordination skills connecting 1/2" resistive beads using 2pt pinch. Pt. worked on disconnecting  the beads using a 3pt. Pinch, and lateral pinch.  Therapeutic Exercise:  Pt. worked on UE there. Ex. With 1.5# dowel for shoulder flexion, chest press, and circular motion. Pt. Was upgraded to 2.5# for bilateral elbow flexion, and extension.  Cues were required for visual demonstration, and movement patterns.  Selfcare:  Pt. required CGA to perform toileting tasks while standing at the bathroom toilet with use of the grab bar. Pt. performed hand hygiene independently with set-up from w/c.                            OT Education - 10/07/17 1451    Education provided  Yes    Education Details  UE strength, and coordination skills    Person(s) Educated  Patient    Methods  Explanation;Demonstration    Comprehension  Verbalized understanding;Returned demonstration;Verbal cues required;Need further instruction       OT Short Term Goals - 03/03/17 1459      OT SHORT TERM GOAL #1   Title  `        OT Long Term Goals - 10/01/17 1449      OT  LONG TERM GOAL #1   Title  Pt. will UE functioning to be able to perfrom toilet hygiene with minimal assistance.    Baseline  Pt. is unable. 05/07/2017: Pt. continues to occassionally have difficulty.    Time  12    Period  Weeks    Status  On-going    Target Date  10/15/17      OT LONG TERM GOAL #2   Title  Pt. will complete self grooming with minA    Baseline  Pt. continues to require extensive assist. Pt. is improving indedependent with oral care with set-up, however requires extensive assist for self-grooming tasls.    Time  12    Period  Weeks    Status  On-going    Target Date  10/15/17      OT LONG TERM GOAL #3   Title  Pt. will perform self-feeding skills with modified indepndence    Baseline   Pt. is able to sccop, and spear the food off of the plate, however requires assist for the hand to mouth pattern. Pt. no longer needs to use built-up foam handle for utensils.    Time  12    Period  Weeks    Status   On-going    Target Date  10/15/17      OT LONG TERM GOAL #4   Title  Pt. will perform self dressing with minA and A/E as needed.    Baseline  Pt. continues to require extensive assist: Pt. continues to have difficulty perfroming UE,a nd LE dressing.    Time  12    Period  Weeks    Status  On-going    Target Date  10/15/17      OT LONG TERM GOAL #5   Title  Pt. will perform light home making tasks with minA    Baseline  Pt. continues to have difficulty with folding laundry, washing dishes while standing at the sinkside. Pt. is able to make a sandwich, and heat things up in th microwave.    Time  12    Period  Weeks    Status  On-going    Target Date  10/15/17      OT LONG TERM GOAL #6   Title  Pt. will write his name efficiently with 100% legibility    Baseline   60% legibility for name.    Time  12    Period  Weeks    Status  On-going    Target Date  10/15/17            Plan - 10/07/17 1452    Clinical Impression Statement Pt. reports his caregiver quit this past Friday. Pt. reports he has a new caregiver to assist him. Pt. reports that she is learning the ropes. Pt. continues to present with limited UE strength, and coordination skills.    Occupational performance deficits (Please refer to evaluation for details):  ADL's;IADL's    Rehab Potential  Fair    OT Frequency  2x / week    OT Duration  12 weeks    OT Treatment/Interventions  Self-care/ADL training;Patient/family education;DME and/or AE instruction;Therapeutic exercise;Moist Heat;Neuromuscular education    Clinical Decision Making  Several treatment options, min-mod task modification necessary    Consulted and Agree with Plan of Care  Patient       Patient will benefit from skilled therapeutic intervention in order to improve the following deficits and impairments:  Pain, Decreased cognition, Decreased activity tolerance, Impaired  UE functional use, Decreased range of motion, Decreased coordination, Decreased  endurance  Visit Diagnosis: Muscle weakness (generalized)    Problem List Patient Active Problem List   Diagnosis Date Noted  . Acute CVA (cerebrovascular accident) ([place]) 05/02/2016  . Left-sided weakness 05/01/2016    Harrel Carina 10/07/2017, 3:00 PM  [place] MAIN [facility] SERVICES [address] [place], [place], [address] Phone: [phone]   Fax:  [phone]  Name: ERVEN RAMSON MRN: [id number] Date of Birth: Jul 19, 1965

## 2017-10-07 NOTE — Patient Instructions (Signed)
Access Code: [email or web address]  URL: [email or web address]  Date: 10/07/2017  Prepared by: Collie Siad   Exercises  Hooklying Transversus Abdominis Palpation - 15 reps - 2 sets - 2x daily - 7x weekly

## 2017-10-07 NOTE — Therapy (Signed)
[place] MAIN [facility] SERVICES [address] [place], [place], [address] Phone: [phone]   Fax:  [phone]  Physical Therapy Treatment  Patient Details  Name: Curtis Powell MRN: [id number] Date of Birth: 04-Sep-1965 Referring Provider: Dr. Larinda Buttery   Encounter Date: 10/07/2017  PT End of Session - 10/07/17 1343    Visit Number  1    Number of Visits  65    Date for PT Re-Evaluation  10/29/17    Authorization Type  8/10    Authorization - Visit Number  6    Authorization - Number of Visits  10    PT Start Time  2409    PT Stop Time  1432    PT Time Calculation (min)  47 min    Equipment Utilized During Treatment  Gait belt    Activity Tolerance  Patient tolerated treatment well;No increased pain    Behavior During Therapy  WFL for tasks assessed/performed       Past Medical History:  Diagnosis Date  . GERD (gastroesophageal reflux disease)   . Hyperlipidemia   . Hypertension   . Obesity   . Stroke [facility])     Past Surgical History:  Procedure Laterality Date  . BRAIN SURGERY      Vitals:   10/07/17 1351  BP: 123/79  Pulse: 78  SpO2: 100%    Subjective Assessment - 10/07/17 1355    Subjective  Pt presented to the ED on 6/30 due to R goin pain with acute onset 3 days prior. Pain radiating down RLE. No trauma or injury but occurred when lifting RLE. Pelvic x-ray with no acute findings. Pt sent home with suspected abductor strain.  Pt reports he is still having pain on occasion, mainly at night before going to bed.  He reports he first felt the pain when completing his heel sliding exercise.  Pt says that using ice this morning eased his pain.  Current pain: 0/10, worst pain: 10/10 (with hip F activities).  Pt reports TTP in R groin area at site of pain.   Pt has not been able to try using rollator since last session as his caregiver quit and he now has a new caregiver.  The plan is to begin using the rollator with  the new caregiver in ~1 wks time.      Pertinent History   Patient is a pleasant 52 year old male who presents to physical therapy for weakness and immobility secondary to CVA.  Had a stroke in Feb 2018. He was previously fully independent, but this stroke caused severe residual deficits, mainly on the right side as well as speech, and now he is unable to walk or perform most of his ADLs on his own. Entire right side is very weak. He still has a little difficulty with speech but his swallowing is improved to baseline. His mom had to move in with him and now is his main caregiver.     Limitations  Sitting;Lifting;Standing;Walking;House hold activities    How long can you sit comfortably?  5 minutes    How long can you stand comfortably?  5 minutes    How long can you walk comfortably?  10 ft    Patient Stated Goals  Walk without walker, walk further with walker, get some strength in back     Currently in Pain?  No/denies not at rest        TREATMENT   Therapeutic Activity:  Mod assist to boost to standing from [facility] with cues to scoot to edge of seat prior to attempting to stand. x1.   Sit>supine: cues for scooting to direct body in preparation to lie down. Min assist to bring BLE into bed due to hip F and core weakness.   Supine>sit: cues to bring BLEs off mat table before rising into sitting. Pt requires min assist to elevate trunk   Sit<>stand from elevated mat table with min guard assist and cues to scoot to edge of mat table prior to standing. Cues for proper hand placement for safe technique. 2x10       Therapeutic Exercise:   Crunches in hooklying with cues to lift shoulders and upper body off mat table. Pt reports that he feels his back and L and R flank are working, thus discontinued after 10 reps.   TrA contraction 2x15 with cues and tactile feedback for proper technique.   Hooklying isometric Bil hip Abd/ER with manual resistance from the PT x10   In sitting pushing  forward with BUEs into pillow held up by this PT 2x15                         PT Education - 10/07/17 1343    Education provided  Yes    Education Details  Exercise technique; to avoid activities requiring hip F when possible; ice site 2-3x/day for 15-20 minutes at a time.     Person(s) Educated  Patient    Methods  Explanation;Demonstration;Verbal cues    Comprehension  Verbalized understanding;Returned demonstration;Verbal cues required;Need further instruction       PT Short Term Goals - 07/16/17 1531      PT SHORT TERM GOAL #1   Title  Patient will be independent in home exercise program to improve strength/mobility for better functional independence with ADLs.    Baseline  hep compliant    Time  2    Period  Weeks    Status  Achieved      PT SHORT TERM GOAL #2   Title  Patient will require min cueing for STS transfer with CGA for increased independence with mobility.     Baseline  CGA    Time  2    Period  Weeks    Status  Achieved      PT SHORT TERM GOAL #3   Title  Patient will maintain upright posture for > 15 seconds to demonstrate strengthened postural control muscualture     Baseline  18 seconds     Time  2    Period  Weeks    Status  Achieved      PT SHORT TERM GOAL #4   Title  Patient will perform         PT Long Term Goals - 09/03/17 1446      PT LONG TERM GOAL #1   Title  Patient will perform bed mobility with Supervision to increase independent mobility    Baseline  able to perform mod I for increased time , occasional Min A for scooting up in bed    Time  8    Period  Weeks    Status  Partially Met    Target Date  10/29/17      PT LONG TERM GOAL #2   Title  Patient (< 61 years old) will complete five times sit to stand test in < 10 seconds indicating an increased LE strength and improved  balance.    Baseline  2/18: 30 seconds from plinth chair height single UE on seat and single on walker ; 32 seconds from plinth with single  UE on seat and single on walker. 4/10: 26 seconds from [facility] with one hand on walker5/29: 22.2 seconds with one hand on walker one hand on WC    Time  8    Period  Weeks    Status  Partially Met    Target Date  10/29/17      PT LONG TERM GOAL #3   Title  Patient will ambulate 40 ft with walker with no breaks to allow for increased mobility within home.     Baseline  12/26: 29 ft with walker and WC follow with no rest breaks; 2/18: multiple trials previous session of >100 ft     Time  8    Period  Weeks    Status  Achieved      PT LONG TERM GOAL #4   Title  Patient will increase BLE gross strength to 4+/5 as to improve functional strength for independent gait, increased standing tolerance and increased ADL ability.    Baseline   4-/5 RLE,     Time  8    Period  Weeks    Status  Partially Met    Target Date  10/29/17      PT LONG TERM GOAL #5   Title  Patient will increase lower extremity functional scale to >40/80 to demonstrate improved functional mobility and increased tolerance with ADLs.     Baseline  14/80; 12/26: 21/80; 2/18: 28/80  4/10; 25/80 5/29: 27/80    Time  8    Period  Weeks    Status  Partially Met    Target Date  10/29/17      PT LONG TERM GOAL #6   Title  Patient will walk through a door involving one small step up/down to increase functional independence.     Baseline  require CGA and min Verbal cueing with BUE support in // bars at this time    Time  8    Period  Weeks    Status  Partially Met    Target Date  10/29/17      PT LONG TERM GOAL #7   Title  Patient will ambulate 40 ft with least assistive device and Supervision with no breaks to allow for increased mobility within home.     Baseline  requires use of walker     Time  8    Period  Weeks    Status  Partially Met    Target Date  10/29/17      PT LONG TERM GOAL #8   Title  Patient will perform 10 MWT in < 1 minute to demonstrate improved ambulatory velocity and capacity.     Baseline  4/10: 2  minutes 45 seconds; 5/29: 2 minute 31 seconds    Time  8    Period  Weeks    Status  Partially Met    Target Date  10/29/17            Plan - 10/07/17 1357    Clinical Impression Statement  Pt presents with what appears to be a R hip flexor strain based on pt's symptomology, MOI, TTP, and aggravating factors.  Pt reports ease of pain with use of ice this morning and was encouraged to continue use of ice moving forward.  Pt instructed to avoid hip F strengthening  exercises (i.e. SLR, heel slide, marching) for now to allow hip flexor to heal.  Will continue to assess moving forward.  Suspect Bil hip F and core weakness as pt requires assist to bring BLEs onto mat table with sit>supine.  Thus introduced core exercises to assist with this.  Pt will benefit from continued skilled PT interventions for improved strength, gait mechanics, and independence.      Clinical Impairments Affecting Rehab Potential  hx of HTN, HLD, CVA, learning disability, Diabetes, brain tumor,     PT Frequency  2x / week    PT Duration  8 weeks    PT Treatment/Interventions  ADLs/Self Care Home Management;Aquatic Therapy;Ultrasound;Moist Heat;Traction;DME Instruction;Gait training;Stair training;Functional mobility training;Therapeutic activities;Therapeutic exercise;Orthotic Fit/Training;Neuromuscular re-education;Balance training;Patient/family education;Manual techniques;Wheelchair mobility training;Passive range of motion;Energy conservation;Taping;Visual/perceptual remediation/compensation    PT Next Visit Plan  floor recovery; pt requests to review HEP and to have handout provided     PT Home Exercise Plan  TrA contraction     Consulted and Agree with Plan of Care  Patient       Patient will benefit from skilled therapeutic intervention in order to improve the following deficits and impairments:  Abnormal gait, Decreased activity tolerance, Decreased balance, Decreased knowledge of precautions, Decreased endurance,  Decreased coordination, Decreased knowledge of use of DME, Decreased mobility, Decreased range of motion, Difficulty walking, Decreased safety awareness, Decreased strength, Impaired flexibility, Impaired perceived functional ability, Impaired tone, Postural dysfunction, Improper body mechanics, Pain  Visit Diagnosis: Muscle weakness (generalized)  Other lack of coordination  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 Acute CVA (cerebrovascular accident) ([place]) 05/02/2016  . Left-sided weakness 05/01/2016    Collie Siad PT, DPT 10/07/2017, 2:47 PM  [place] MAIN [facility] SERVICES [address] [place], [place], [address] Phone: [phone]   Fax:  [phone]  Name: Curtis Powell MRN: [id number] Date of Birth: 1965/05/12

## 2017-10-14 ENCOUNTER — Ambulatory Visit: Payer: Medicare HMO | Admitting: Physical Therapy

## 2017-10-14 ENCOUNTER — Encounter: Payer: Self-pay | Admitting: Occupational Therapy

## 2017-10-14 ENCOUNTER — Ambulatory Visit: Payer: Medicare HMO | Admitting: Occupational Therapy

## 2017-10-14 ENCOUNTER — Encounter: Payer: Self-pay | Admitting: Physical Therapy

## 2017-10-14 ENCOUNTER — Other Ambulatory Visit: Payer: Self-pay

## 2017-10-14 DIAGNOSIS — R2689 Other abnormalities of gait and mobility: Secondary | ICD-10-CM

## 2017-10-14 DIAGNOSIS — R278 Other lack of coordination: Secondary | ICD-10-CM

## 2017-10-14 DIAGNOSIS — M6281 Muscle weakness (generalized): Secondary | ICD-10-CM

## 2017-10-14 DIAGNOSIS — I69351 Hemiplegia and hemiparesis following cerebral infarction affecting right dominant side: Secondary | ICD-10-CM

## 2017-10-14 NOTE — Therapy (Signed)
[place] MAIN [facility] SERVICES [address] [place], [place], [address] Phone: [phone]   Fax:  [phone]  Occupational Therapy Treatment  Patient Details  Name: Curtis Powell MRN: [id number] Date of Birth: [birth year] Referring Provider: Dr. Larinda Buttery   Encounter Date: 10/14/2017  OT End of Session - 10/14/17 1634    Visit Number  40    Number of Visits  60    Date for OT Re-Evaluation  10/15/17    Authorization Type  Visit 2 of 10 for progress reporting period starting 10/07/2017    OT Start Time  1430    OT Stop Time  1515    OT Time Calculation (min)  45 min    Activity Tolerance  Patient tolerated treatment well    Behavior During Therapy  [facility] for tasks assessed/performed       Past Medical History:  Diagnosis Date  . GERD (gastroesophageal reflux disease)   . Hyperlipidemia   . Hypertension   . Obesity   . Stroke [facility])     Past Surgical History:  Procedure Laterality Date  . BRAIN SURGERY      There were no vitals filed for this visit.  Subjective Assessment - 10/14/17 1627    Subjective   Pt reported that he continues to have groin pain but seems a little better.    Pertinent History  Pt. is a 52 y.o. male who suffered a CVA on 05/01/2016. Pt. was admitted to the hospital. Once discharged, he received Home Health PT and OT services for about a month. Pt. has had multiple CVAs over the past 8 years, and has had multiple falls in the past 6 months. Pt. resides in an apartment. Pt. has caregivers for 80 hours. Pt.'s mother stays with pt. at night, and assists with IADL tasks.      Patient Stated Goals  To be able to throw a ball and dribble a ball.    Currently in Pain?  Yes    Pain Score  5     Pain Location  Groin    Pain Orientation  Right    Pain Descriptors / Indicators  Aching    Pain Type  Acute pain    Pain Onset  More than a month ago    Pain Frequency  Intermittent    Aggravating Factors    rolling in bed    Pain Relieving Factors  trying not to roll in bed    Effect of Pain on Daily Activities  only rolling in bed    Multiple Pain Sites  No       Pt. tolerated scapular mobilizations in sitting for scapular elevation, depression, abduction/rotation to prepare the RUE for ROM. Pt. Tolerated metacarpal spread stretches, and retrograde massage for edema control in the right hand.  Patient seen for UE strengthening with 3# dowel stick for shoulder flexion, chest press, ABD/ADD, diagonal patterns for 10 repetitions for 2 sets. Cues for correct and desired movement pattern and increasing active elbow extension on right.    Patient seen for handwriting skills with regular grip built up pen with cues for tripod grasp, writing name and address with right dominant hand.  Improved legibility this date compared to last session.                       OT Education - 10/14/17 1633    Education provided  Yes    Education Details  UE strength, and coordination skills    Person(s) Educated  Patient    Methods  Explanation;Demonstration    Comprehension  Verbalized understanding;Returned demonstration;Verbal cues required;Need further instruction       OT Short Term Goals - 03/03/17 1459      OT SHORT TERM GOAL #1   Title  `        OT Long Term Goals - 10/01/17 1449      OT LONG TERM GOAL #1   Title  Pt. will UE functioning to be able to perfrom toilet hygiene with minimal assistance.    Baseline  Pt. is unable. 05/07/2017: Pt. continues to occassionally have difficulty.    Time  12    Period  Weeks    Status  On-going    Target Date  10/15/17      OT LONG TERM GOAL #2   Title  Pt. will complete self grooming with minA    Baseline  Pt. continues to require extensive assist. Pt. is improving indedependent with oral care with set-up, however requires extensive assist for self-grooming tasls.    Time  12    Period  Weeks    Status  On-going    Target Date   10/15/17      OT LONG TERM GOAL #3   Title  Pt. will perform self-feeding skills with modified indepndence    Baseline   Pt. is able to sccop, and spear the food off of the plate, however requires assist for the hand to mouth pattern. Pt. no longer needs to use built-up foam handle for utensils.    Time  12    Period  Weeks    Status  On-going    Target Date  10/15/17      OT LONG TERM GOAL #4   Title  Pt. will perform self dressing with minA and A/E as needed.    Baseline  Pt. continues to require extensive assist: Pt. continues to have difficulty perfroming UE,a nd LE dressing.    Time  12    Period  Weeks    Status  On-going    Target Date  10/15/17      OT LONG TERM GOAL #5   Title  Pt. will perform light home making tasks with minA    Baseline  Pt. continues to have difficulty with folding laundry, washing dishes while standing at the sinkside. Pt. is able to make a sandwich, and heat things up in th microwave.    Time  12    Period  Weeks    Status  On-going    Target Date  10/15/17      OT LONG TERM GOAL #6   Title  Pt. will write his name efficiently with 100% legibility    Baseline   60% legibility for name.    Time  12    Period  Weeks    Status  On-going    Target Date  10/15/17            Plan - 10/14/17 1634    Clinical Impression Statement  Pt reported that his groin pain was getting better.  He participated well in scapular mobilization exercises for RUE and had increased active ROM using 3#dowel today.  He reports using both hands for participation in folding and sorting laundry and washing dishes at home to help out more for IADLs.  Discussed adding stretches for increasing trunk extension and shoulder retraction since his posture is starting  to come forward from sitting in wheelchair. He cotninues to make progress and is motivated to regain use of RUe and hand.      Occupational performance deficits (Please refer to evaluation for details):  ADL's;IADL's     Rehab Potential  Fair    OT Frequency  2x / week    OT Duration  12 weeks    OT Treatment/Interventions  Self-care/ADL training;Patient/family education;DME and/or AE instruction;Therapeutic exercise;Moist Heat;Neuromuscular education    Clinical Decision Making  Several treatment options, min-mod task modification necessary    Consulted and Agree with Plan of Care  Patient       Patient will benefit from skilled therapeutic intervention in order to improve the following deficits and impairments:  Pain, Decreased cognition, Decreased activity tolerance, Impaired UE functional use, Decreased range of motion, Decreased coordination, Decreased endurance  Visit Diagnosis: Muscle weakness (generalized)  Other lack of coordination  Hemiplegia and hemiparesis following cerebral infarction affecting right dominant side [facility])    Problem List Patient Active Problem List   Diagnosis Date Noted  . Acute CVA (cerebrovascular accident) ([place]) 05/02/2016  . Left-sided weakness 05/01/2016    Chrys Racer, OTR/L ascom [phone] 10/14/17, 4:45 PM  [place] MAIN [facility] SERVICES [address] [place], [place], [address] Phone: [phone]   Fax:  [phone]  Name: Curtis Powell MRN: [id number] Date of Birth: March 29, 1966

## 2017-10-14 NOTE — Therapy (Signed)
[place] MAIN [facility] SERVICES [address] [place], [place], [address] Phone: [phone]   Fax:  [phone]  Physical Therapy Treatment  Patient Details  Name: Curtis Powell MRN: [id number] Date of Birth: Dec 16, 1965 Referring Provider: Dr. Larinda Buttery   Encounter Date: 10/14/2017  PT End of Session - 10/14/17 1350    Visit Number  70    Number of Visits  45    Date for PT Re-Evaluation  10/29/17    Authorization Type  9/10    Authorization - Visit Number  7    Authorization - Number of Visits  10    PT Start Time  0132    PT Stop Time  0215    PT Time Calculation (min)  43 min    Equipment Utilized During Treatment  Gait belt    Activity Tolerance  Patient tolerated treatment well;No increased pain    Behavior During Therapy  WFL for tasks assessed/performed       Past Medical History:  Diagnosis Date  . GERD (gastroesophageal reflux disease)   . Hyperlipidemia   . Hypertension   . Obesity   . Stroke [facility])     Past Surgical History:  Procedure Laterality Date  . BRAIN SURGERY      There were no vitals filed for this visit.  Subjective Assessment - 10/14/17 1345    Subjective  Patient reports that his groin is feeling better today. He says that he asked the MD for an electric wc for a powerchair.     Pertinent History   Patient is a pleasant 52 year old male who presents to physical therapy for weakness and immobility secondary to CVA.  Had a stroke in Feb 2018. He was previously fully independent, but this stroke caused severe residual deficits, mainly on the right side as well as speech, and now he is unable to walk or perform most of his ADLs on his own. Entire right side is very weak. He still has a little difficulty with speech but his swallowing is improved to baseline. His mom had to move in with him and now is his main caregiver.     Limitations  Sitting;Lifting;Standing;Walking;House hold activities    How  long can you sit comfortably?  5 minutes    How long can you stand comfortably?  5 minutes    How long can you walk comfortably?  10 ft    Patient Stated Goals  Walk without walker, walk further with walker, get some strength in back     Currently in Pain?  No/denies    Pain Score  0-No pain    Pain Onset  More than a month ago      Treatment   Nustep Lvl 3, 5 minutes  LE only for LE strengthening and functional capacity in a non ambulatory position. Seat in #7 Seated LAQ x 20 , hip flex x 20 BLE.   Gait training with rolator walker: 1x40 ft, 2 x35 ft; decreased hip flexion with trunk lateral flexion for compensation in leg swing.  STS transfers x 4 Min Guard assist to [place] transfers x 2 for Nustep with  Min assist  Patient needs min VC for exercise correction   Patient will continue to benefit from skilled physical therapy to improve strength balance and gait safety  PT Education - 10/14/17 1349    Education provided  Yes    Education Details  HEP     Person(s) Educated  Patient    Methods  Explanation    Comprehension  Verbalized understanding       PT Short Term Goals - 07/16/17 1531      PT SHORT TERM GOAL #1   Title  Patient will be independent in home exercise program to improve strength/mobility for better functional independence with ADLs.    Baseline  hep compliant    Time  2    Period  Weeks    Status  Achieved      PT SHORT TERM GOAL #2   Title  Patient will require min cueing for STS transfer with CGA for increased independence with mobility.     Baseline  CGA    Time  2    Period  Weeks    Status  Achieved      PT SHORT TERM GOAL #3   Title  Patient will maintain upright posture for > 15 seconds to demonstrate strengthened postural control muscualture     Baseline  18 seconds     Time  2    Period  Weeks    Status  Achieved      PT SHORT TERM GOAL #4   Title  Patient will perform          PT Long Term Goals - 09/03/17 1446      PT LONG TERM GOAL #1   Title  Patient will perform bed mobility with Supervision to increase independent mobility    Baseline  able to perform mod I for increased time , occasional Min A for scooting up in bed    Time  8    Period  Weeks    Status  Partially Met    Target Date  10/29/17      PT LONG TERM GOAL #2   Title  Patient (< 19 years old) will complete five times sit to stand test in < 10 seconds indicating an increased LE strength and improved balance.    Baseline  2/18: 30 seconds from plinth chair height single UE on seat and single on walker ; 32 seconds from plinth with single UE on seat and single on walker. 4/10: 26 seconds from [facility] with one hand on walker5/29: 22.2 seconds with one hand on walker one hand on WC    Time  8    Period  Weeks    Status  Partially Met    Target Date  10/29/17      PT LONG TERM GOAL #3   Title  Patient will ambulate 40 ft with walker with no breaks to allow for increased mobility within home.     Baseline  12/26: 29 ft with walker and WC follow with no rest breaks; 2/18: multiple trials previous session of >100 ft     Time  8    Period  Weeks    Status  Achieved      PT LONG TERM GOAL #4   Title  Patient will increase BLE gross strength to 4+/5 as to improve functional strength for independent gait, increased standing tolerance and increased ADL ability.    Baseline   4-/5 RLE,     Time  8    Period  Weeks    Status  Partially Met    Target Date  10/29/17      PT LONG  TERM GOAL #5   Title  Patient will increase lower extremity functional scale to >40/80 to demonstrate improved functional mobility and increased tolerance with ADLs.     Baseline  14/80; 12/26: 21/80; 2/18: 28/80  4/10; 25/80 5/29: 27/80    Time  8    Period  Weeks    Status  Partially Met    Target Date  10/29/17      PT LONG TERM GOAL #6   Title  Patient will walk through a door involving one small step up/down to  increase functional independence.     Baseline  require CGA and min Verbal cueing with BUE support in // bars at this time    Time  8    Period  Weeks    Status  Partially Met    Target Date  10/29/17      PT LONG TERM GOAL #7   Title  Patient will ambulate 40 ft with least assistive device and Supervision with no breaks to allow for increased mobility within home.     Baseline  requires use of walker     Time  8    Period  Weeks    Status  Partially Met    Target Date  10/29/17      PT LONG TERM GOAL #8   Title  Patient will perform 10 MWT in < 1 minute to demonstrate improved ambulatory velocity and capacity.     Baseline  4/10: 2 minutes 45 seconds; 5/29: 2 minute 31 seconds    Time  8    Period  Weeks    Status  Partially Met    Target Date  10/29/17              Patient will benefit from skilled therapeutic intervention in order to improve the following deficits and impairments:     Visit Diagnosis: Muscle weakness (generalized)  Other lack of coordination  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 Acute CVA (cerebrovascular accident) ([place]) 05/02/2016  . Left-sided weakness 05/01/2016    Alanson Puls , [place] DPT 10/14/2017, 1:53 PM  [place] [facility] MAIN [facility] SERVICES [address] [place], [place], [address] Phone: [phone]   Fax:  [phone]  Name: CORBEN AUZENNE MRN: [id number] Date of Birth: [birth year]

## 2017-10-16 ENCOUNTER — Ambulatory Visit: Payer: Medicare HMO | Admitting: Occupational Therapy

## 2017-10-16 ENCOUNTER — Ambulatory Visit: Payer: Medicare HMO

## 2017-10-21 ENCOUNTER — Encounter: Payer: Self-pay | Admitting: Physical Therapy

## 2017-10-21 ENCOUNTER — Ambulatory Visit: Payer: Medicare HMO | Admitting: Occupational Therapy

## 2017-10-21 ENCOUNTER — Ambulatory Visit: Payer: Medicare HMO | Admitting: Physical Therapy

## 2017-10-21 VITALS — BP 127/69 | HR 86

## 2017-10-21 DIAGNOSIS — M6281 Muscle weakness (generalized): Secondary | ICD-10-CM

## 2017-10-21 DIAGNOSIS — R278 Other lack of coordination: Secondary | ICD-10-CM

## 2017-10-21 DIAGNOSIS — R2689 Other abnormalities of gait and mobility: Secondary | ICD-10-CM

## 2017-10-21 DIAGNOSIS — I69351 Hemiplegia and hemiparesis following cerebral infarction affecting right dominant side: Secondary | ICD-10-CM

## 2017-10-21 NOTE — Therapy (Signed)
[place] MAIN [facility] SERVICES [address] [place], [place], [address] Phone: [phone]   Fax:  [phone]  Physical Therapy Treatment  Physical Therapy Progress Note  Dates of reporting period  09/08/17   to   10/21/17   Patient Details  Name: Curtis Powell MRN: [id number] Date of Birth: 1965/05/04 Referring Provider: Dr. Larinda Buttery   Encounter Date: 10/21/2017  PT End of Session - 10/21/17 1300    Visit Number  29    Number of Visits  53    Date for PT Re-Evaluation  12/16/17    Authorization Type  10/10 (next visit 1/10)    Authorization Time Period  Start of reporting period 7/16    Authorization - Visit Number  --    Authorization - Number of Visits  --    PT Start Time  1300    PT Stop Time  1359    PT Time Calculation (min)  59 min    Equipment Utilized During Treatment  Gait belt    Activity Tolerance  Patient tolerated treatment well;No increased pain    Behavior During Therapy  WFL for tasks assessed/performed       Past Medical History:  Diagnosis Date  . GERD (gastroesophageal reflux disease)   . Hyperlipidemia   . Hypertension   . Obesity   . Stroke [facility])     Past Surgical History:  Procedure Laterality Date  . BRAIN SURGERY      Vitals:   10/21/17 1308  BP: 127/69  Pulse: 86  SpO2: 100%    Subjective Assessment - 10/21/17 1307    Subjective  Pt is hoping to move into a more handicap accessible home sometime soon and is in the process of house hunting.  Pt says he will still be local and will be able to continue at this clinic.  Pt has not felt groin pain since 2 weeks ago. Pt would like to practice supine<>sit.   Pt reports that since starting therapy his mobility has significantly improved with sit<>stand, bed mobility, and ambulation.     Pertinent History   Patient is a pleasant 52 year old male who presents to physical therapy for weakness and immobility secondary to CVA.  Had a stroke  in Feb 2018. He was previously fully independent, but this stroke caused severe residual deficits, mainly on the right side as well as speech, and now he is unable to walk or perform most of his ADLs on his own. Entire right side is very weak. He still has a little difficulty with speech but his swallowing is improved to baseline. His mom had to move in with him and now is his main caregiver.     Limitations  Sitting;Lifting;Standing;Walking;House hold activities    How long can you sit comfortably?  5 minutes    How long can you stand comfortably?  5 minutes    How long can you walk comfortably?  10 ft    Patient Stated Goals  Walk without walker, walk further with walker, get some strength in back     Currently in Pain?  No/denies    Pain Onset  More than a month ago        TREATMENT   5xSTS: (one hand on RW and one hand on WC) 18.96 seconds; pt achieving 75% of full upright standing   Assessment of supine<>sit: (pt has a bed with a rail on it at home that he uses for  rolling over and scooting to [facility]). On first attempt pt does not scoot back in bed before supine>sit placing much more demand on his core and requiring significantly increased effort due to challenge to pt's balance. On second attempt the pt performs with more ease after cues and demonstration to scoot back on side of bed. Min guard for safety.   Assessment of supine>sit: Pt predominantly uses his core to heave into sitting while moving LEs off bed. Min guard for safety. Pt instructed to use bed rail at home for assist. Pt's bed is a hospital bed and reports it is easier for him at home because he can lift HOB up.   70mT: 3 min 22 sec   LEFS: 29/80   Sit<>stand: x2, x5 with cues to scoot to edge of seat and to bring feet back for greater mechanical advantage. Pt demonstrates fatigue at end of second set.   Pt requested to use the bathroom, requiring assist from this PT to push into bathroom and hold WC secure with sit<>stand  as well as providing min guard assist for safety as pt voids in standing. Pt performs sit<>stand with use of railing on L side. Pt stood for ~2 minutes to void with 1UE support.           Patient's condition has the potential to improve in response to therapy. Maximum improvement is yet to be obtained. The anticipated improvement is attainable and reasonable in a generally predictable time.         PT Education - 10/21/17 1300    Education provided  Yes    Education Details  Exercise technique; examination results    Person(s) Educated  Patient    Methods  Explanation;Demonstration;Verbal cues    Comprehension  Verbalized understanding;Returned demonstration;Verbal cues required;Need further instruction       PT Short Term Goals - 07/16/17 1531      PT SHORT TERM GOAL #1   Title  Patient will be independent in home exercise program to improve strength/mobility for better functional independence with ADLs.    Baseline  hep compliant    Time  2    Period  Weeks    Status  Achieved      PT SHORT TERM GOAL #2   Title  Patient will require min cueing for STS transfer with CGA for increased independence with mobility.     Baseline  CGA    Time  2    Period  Weeks    Status  Achieved      PT SHORT TERM GOAL #3   Title  Patient will maintain upright posture for > 15 seconds to demonstrate strengthened postural control muscualture     Baseline  18 seconds     Time  2    Period  Weeks    Status  Achieved      PT SHORT TERM GOAL #4   Title  Patient will perform         PT Long Term Goals - 10/21/17 1309      PT LONG TERM GOAL #1   Title  Patient will perform bed mobility with Supervision to increase independent mobility    Baseline  able to perform mod I for increased time , occasional Min A for scooting up in bed; 7/16: min guard for safety with increased effort and time, cues to scoot back in bed before sit>supine    Time  8    Period  Weeks  Status  Partially  Met      PT LONG TERM GOAL #2   Title  Patient (< 30 years old) will complete five times sit to stand test in < 10 seconds indicating an increased LE strength and improved balance.    Baseline  2/18: 30 seconds from plinth chair height single UE on seat and single on walker ; 32 seconds from plinth with single UE on seat and single on walker. 4/10: 26 seconds from [facility] with one hand on walker5/29: 22.2 seconds with one hand on walker one hand on WC; 7/16: one hand on walker one hand on WC 18.96 seconds; pt achieving 75% of full upright standing    Time  8    Period  Weeks    Status  Partially Met      PT LONG TERM GOAL #3   Title  Patient will ambulate 40 ft with walker with no breaks to allow for increased mobility within home.     Baseline  12/26: 29 ft with walker and WC follow with no rest breaks; 2/18: multiple trials previous session of >100 ft     Time  8    Period  Weeks    Status  Achieved      PT LONG TERM GOAL #4   Title  Patient will increase BLE gross strength to 4+/5 as to improve functional strength for independent gait, increased standing tolerance and increased ADL ability.    Baseline   4-/5 RLE,     Time  8    Period  Weeks    Status  Partially Met      PT LONG TERM GOAL #5   Title  Patient will increase lower extremity functional scale to >40/80 to demonstrate improved functional mobility and increased tolerance with ADLs.     Baseline  14/80; 12/26: 21/80; 2/18: 28/80  4/10; 25/80 5/29: 27/80; 7/16: 29/80    Time  8    Period  Weeks    Status  Partially Met      PT LONG TERM GOAL #6   Title  Patient will walk through a door involving one small step up/down to increase functional independence.     Baseline  require CGA and min Verbal cueing with BUE support in // bars at this time    Time  8    Period  Weeks    Status  Partially Met      PT LONG TERM GOAL #7   Title  Patient will ambulate 40 ft with least assistive device and Supervision with no breaks to allow  for increased mobility within home.     Baseline  requires use of walker     Time  8    Period  Weeks    Status  Partially Met      PT LONG TERM GOAL #8   Title  Patient will perform 10 MWT in < 1 minute to demonstrate improved ambulatory velocity and capacity.     Baseline  4/10: 2 minutes 45 seconds; 5/29: 2 minute 31 seconds; 3 minutes 22 seconds    Time  8    Period  Weeks    Status  On-going            Plan - 10/21/17 1423    Clinical Impression Statement  Pt's 5xSTS improved since last assessment; although pt unable to achieve full upright with each stand.  Pt's 44mT time increased significantly, suspected to be due to recent  groin strain ~1.5-2 weeks ago, limiting pt's mobility and thus resulting in dec speed on assessment this session.  Suspect that since this has healed and pt is no longer feeling pain, his ambulatory speed and endurance will improve with goal to surpass earlier achievement.  Pt was able to perform supine>sit with min guard this session compared to min assist on previous assessment date.  Cues provided to scoot back in bed prior to sit>supine for improved ease.  Pt's LEFS improved since last assessment. Pt will benefit from skilled PT interventions for improved strength, balance, strength, and independence.      Clinical Impairments Affecting Rehab Potential  hx of HTN, HLD, CVA, learning disability, Diabetes, brain tumor,     PT Frequency  2x / week    PT Duration  8 weeks    PT Treatment/Interventions  ADLs/Self Care Home Management;Aquatic Therapy;Ultrasound;Moist Heat;Traction;DME Instruction;Gait training;Stair training;Functional mobility training;Therapeutic activities;Therapeutic exercise;Orthotic Fit/Training;Neuromuscular re-education;Balance training;Patient/family education;Manual techniques;Wheelchair mobility training;Passive range of motion;Energy conservation;Taping;Visual/perceptual remediation/compensation    PT Next Visit Plan  floor recovery;  pt requests to review HEP and to have handout provided     PT Home Exercise Plan  TrA contraction     Consulted and Agree with Plan of Care  Patient       Patient will benefit from skilled therapeutic intervention in order to improve the following deficits and impairments:  Abnormal gait, Decreased activity tolerance, Decreased balance, Decreased knowledge of precautions, Decreased endurance, Decreased coordination, Decreased knowledge of use of DME, Decreased mobility, Decreased range of motion, Difficulty walking, Decreased safety awareness, Decreased strength, Impaired flexibility, Impaired perceived functional ability, Impaired tone, Postural dysfunction, Improper body mechanics, Pain  Visit Diagnosis: Muscle weakness (generalized)  Other lack of coordination  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 Acute CVA (cerebrovascular accident) ([place]) 05/02/2016  . Left-sided weakness 05/01/2016    Collie Siad PT, DPT 10/21/2017, 2:24 PM  [place] MAIN [facility] SERVICES [address] [place], [place], [address] Phone: [phone]   Fax:  [phone]  Name: Curtis Powell MRN: [id number] Date of Birth: 1966-02-24

## 2017-10-22 ENCOUNTER — Encounter: Payer: Self-pay | Admitting: Occupational Therapy

## 2017-10-22 NOTE — Therapy (Signed)
[place] MAIN [facility] SERVICES [address] [place], [place], [address] Phone: [phone]   Fax:  [phone]  Occupational Therapy Treatment/Recertification   Patient Details  Name: Curtis Powell MRN: [id number] Date of Birth: June 10, 1965 Referring Provider: Dr. Larinda Buttery   Encounter Date: 10/21/2017  OT End of Session - 10/22/17 0836    Visit Number  41    Number of Visits  18    Date for OT Re-Evaluation  01/14/18    Authorization Type  Visit 3 of 10 for progress reporting period starting 10/07/2017    OT Start Time  1300    OT Stop Time  1346    OT Time Calculation (min)  46 min    Activity Tolerance  Patient tolerated treatment well    Behavior During Therapy  [facility] for tasks assessed/performed       Past Medical History:  Diagnosis Date  . GERD (gastroesophageal reflux disease)   . Hyperlipidemia   . Hypertension   . Obesity   . Stroke [facility])     Past Surgical History:  Procedure Laterality Date  . BRAIN SURGERY      There were no vitals filed for this visit.  Subjective Assessment - 10/22/17 0830    Subjective   Patient reports he would like to work on handwriting today.  Denies any pain this date.    Pertinent History  Pt. is a 52 y.o. male who suffered a CVA on 05/01/2016. Pt. was admitted to the hospital. Once discharged, he received Home Health PT and OT services for about a month. Pt. has had multiple CVAs over the past 8 years, and has had multiple falls in the past 6 months. Pt. resides in an apartment. Pt. has caregivers for 80 hours. Pt.'s mother stays with pt. at night, and assists with IADL tasks.      Patient Stated Goals  To be able to throw a ball and dribble a ball.    Currently in Pain?  No/denies    Pain Score  0-No pain                   OT Treatments/Exercises (OP) - 10/22/17 0830      ADLs   ADL Comments  Patient seen for focus on handwriting skills with copying list of  items and sorting them into 2 categories, animals and colors.  Patient completed with sorting items correctly, legibility fair, cues for a couple of words.  ADL assessment:  toileting with moderate to max assist for toilet hygiene, grooming with moderate assist, self feeding improving still has some spillage with hand to mouth patterns, dressing still requires greater than moderate assist, light homemaking tasks-patient still working towards helping with laundry, activities in standing, can make a sandwich from seated position.       Neurological Re-education Exercises   Other Exercises 1  Patient seen for strengthening of pinch with resistive pinch pins to place onto elevated surface, reaching 8 inches above tabletop height.  Diffiiculty noted with most resistive pinch pin (black)  Manipulation of [place] discs to turn and flip with right hand, cues for speed and dexterity.  Bilateral hand use to focus on simulataneous performance of turning object, cues for technique.  Patient picking up and manipulating coins from tabletop to place into resistive bank, placed to encourage reach.               OT Education - 10/22/17 1749  Education provided  Yes    Education Details  handwriting, manipulation of small objects    Person(s) Educated  Patient    Methods  Explanation;Demonstration    Comprehension  Verbalized understanding;Returned demonstration;Verbal cues required;Need further instruction       OT Short Term Goals - 03/03/17 1459      OT SHORT TERM GOAL #1   Title  `        OT Long Term Goals - 10/22/17 9242      OT LONG TERM GOAL #1   Title  Pt. will UE functioning to be able to perfrom toilet hygiene with minimal assistance.    Baseline  Pt. is unable. 05/07/2017: Pt. continues to occassionally have difficulty.    Time  12    Period  Weeks    Status  On-going      OT LONG TERM GOAL #2   Title  Pt. will complete self grooming with minA    Baseline  Pt. continues to  require extensive assist. Pt. is improving indedependent with oral care with set-up, however requires extensive assist for self-grooming tasls.    Time  12    Period  Weeks    Status  On-going      OT LONG TERM GOAL #3   Title  Pt. will perform self-feeding skills with modified indepndence    Baseline   Pt. is able to sccop, and spear the food off of the plate, however requires assist for the hand to mouth pattern. Pt. no longer needs to use built-up foam handle for utensils.    Time  12    Period  Weeks    Status  On-going      OT LONG TERM GOAL #4   Title  Pt. will perform self dressing with minA and A/E as needed.    Baseline  Pt. continues to require extensive assist: Pt. continues to have difficulty perfroming UE,a nd LE dressing.    Time  12    Period  Weeks    Status  On-going      OT LONG TERM GOAL #5   Title  Pt. will perform light home making tasks with minA    Baseline  Pt. continues to have difficulty with folding laundry, washing dishes while standing at the sinkside. Pt. is able to make a sandwich, and heat things up in th microwave.    Time  12    Period  Weeks    Status  On-going      OT LONG TERM GOAL #6   Title  Pt. will write his name efficiently with 100% legibility    Baseline   60% legibility for name.    Time  12    Period  Weeks    Status  On-going            Plan - 10/22/17 0839    Clinical Impression Statement  Patient has continued to make progress towards goals.  He continues to require increased assistance with self care and IADL tasks but is working towards participating more and using hands more to engage in activities at home.  He has been working on Psychiatrist, making himself a sandwich and helping to was dishes.  He has pain in the right arm and shoulder at times but did not have any pain today.  Reaching is limited on the right for overhead activities and was able to reach 8 inches greater than tabletop height today.  Patient continues to benefit from skilled OT to [facility] safety and independence in daily tasks.     Occupational performance deficits (Please refer to evaluation for details):  ADL's;IADL's    Rehab Potential  Fair    OT Frequency  2x / week    OT Duration  12 weeks    OT Treatment/Interventions  Self-care/ADL training;Patient/family education;DME and/or AE instruction;Therapeutic exercise;Moist Heat;Neuromuscular education    Consulted and Agree with Plan of Care  Patient       Patient will benefit from skilled therapeutic intervention in order to improve the following deficits and impairments:  Pain, Decreased cognition, Decreased activity tolerance, Impaired UE functional use, Decreased range of motion, Decreased coordination, Decreased endurance  Visit Diagnosis: Muscle weakness (generalized)  Other lack of coordination  Hemiplegia and hemiparesis following cerebral infarction affecting right dominant side [facility])    Problem List Patient Active Problem List   Diagnosis Date Noted  . Acute CVA (cerebrovascular accident) ([place]) 05/02/2016  . Left-sided weakness 05/01/2016   Debbrah Sampedro T [name], OTR/L, CLT  Avaiah Stempel 10/22/2017, 8:55 AM  [place] MAIN [facility] SERVICES [address] [place], [place], [address] Phone: [phone]   Fax:  [phone]  Name: Curtis Powell MRN: [id number] Date of Birth: 05/15/1965

## 2017-10-23 ENCOUNTER — Ambulatory Visit: Payer: Medicare HMO | Admitting: Occupational Therapy

## 2017-10-23 ENCOUNTER — Ambulatory Visit: Payer: Medicare HMO

## 2017-10-28 ENCOUNTER — Ambulatory Visit: Payer: Medicare HMO

## 2017-10-28 ENCOUNTER — Encounter: Payer: Self-pay | Admitting: Occupational Therapy

## 2017-10-28 ENCOUNTER — Ambulatory Visit: Payer: Medicare HMO | Admitting: Occupational Therapy

## 2017-10-28 DIAGNOSIS — M6281 Muscle weakness (generalized): Secondary | ICD-10-CM | POA: Diagnosis not present

## 2017-10-28 DIAGNOSIS — R278 Other lack of coordination: Secondary | ICD-10-CM

## 2017-10-28 DIAGNOSIS — I69351 Hemiplegia and hemiparesis following cerebral infarction affecting right dominant side: Secondary | ICD-10-CM

## 2017-10-28 DIAGNOSIS — R2689 Other abnormalities of gait and mobility: Secondary | ICD-10-CM

## 2017-10-28 NOTE — Therapy (Addendum)
[place] MAIN [facility] SERVICES [address] [place], [place], [address] Phone: [phone]   Fax:  [phone]  Occupational Therapy Treatment  Patient Details  Name: Curtis Powell MRN: [id number] Date of Birth: 1966-03-18 Referring Provider: Dr. Larinda Buttery   Encounter Date: 10/28/2017  OT End of Session - 10/28/17 1432    Visit Number  42    Number of Visits  66    Date for OT Re-Evaluation  01/14/18    Authorization Type  Visit 4 of 10 for progress reporting period starting 10/07/2017    OT Start Time  1430    OT Stop Time  1515    OT Time Calculation (min)  45 min    Activity Tolerance  Patient tolerated treatment well    Behavior During Therapy  [facility] for tasks assessed/performed       Past Medical History:  Diagnosis Date  . GERD (gastroesophageal reflux disease)   . Hyperlipidemia   . Hypertension   . Obesity   . Stroke [facility])     Past Surgical History:  Procedure Laterality Date  . BRAIN SURGERY      There were no vitals filed for this visit.  Subjective Assessment - 10/28/17 1431    Subjective   Pt. reports that his living situation is getting better.    Patient is accompained by:  Family member    Pertinent History  Pt. is a 52 y.o. male who suffered a CVA on 05/01/2016. Pt. was admitted to the hospital. Once discharged, he received Home Health PT and OT services for about a month. Pt. has had multiple CVAs over the past 8 years, and has had multiple falls in the past 6 months. Pt. resides in an apartment. Pt. has caregivers for 80 hours. Pt.'s mother stays with pt. at night, and assists with IADL tasks.      Patient Stated Goals  To be able to throw a ball and dribble a ball.    Currently in Pain?  No/denies       OT TREATMENT    Therapeutic Exercise:  Pt. tolerated bilateral UE strengthening exercises with red theraband for bilateral horizontal abduction, shoulder flexion, and bilateral elbow flexion,  and extension. For 1 set of10 reps each.  Selfcare:  Pt. worked on Building surveyor. Pt. was able to grasp, hold, and use a medium sized pen with a grip. Pt. worked on writing speed, and legibility when formulating words. Pt. worked on spacing as [employer] are very close together. Pt. worked on Financial risk analyst. Pt.'s writing becomes significantly more illegible when writing from memory. Pt. requires increased time to complete.                         OT Education - 10/28/17 1432    Education provided  Yes    Education Details  handwriting, manipulation of small objects    Person(s) Educated  Patient    Methods  Explanation;Demonstration    Comprehension  Verbalized understanding;Returned demonstration;Verbal cues required;Need further instruction       OT Short Term Goals - 03/03/17 1459      OT SHORT TERM GOAL #1   Title  `        OT Long Term Goals - 10/22/17 6433      OT LONG TERM GOAL #1   Title  Pt. will UE functioning to be able to perfrom toilet hygiene with minimal assistance.  Baseline  Pt. is unable. 05/07/2017: Pt. continues to occassionally have difficulty.    Time  12    Period  Weeks    Status  On-going      OT LONG TERM GOAL #2   Title  Pt. will complete self grooming with minA    Baseline  Pt. continues to require extensive assist. Pt. is improving indedependent with oral care with set-up, however requires extensive assist for self-grooming tasls.    Time  12    Period  Weeks    Status  On-going      OT LONG TERM GOAL #3   Title  Pt. will perform self-feeding skills with modified indepndence    Baseline   Pt. is able to sccop, and spear the food off of the plate, however requires assist for the hand to mouth pattern. Pt. no longer needs to use built-up foam handle for utensils.    Time  12    Period  Weeks    Status  On-going      OT LONG TERM GOAL #4   Title  Pt. will perform self dressing with minA and A/E as needed.     Baseline  Pt. continues to require extensive assist: Pt. continues to have difficulty perfroming UE,a nd LE dressing.    Time  12    Period  Weeks    Status  On-going      OT LONG TERM GOAL #5   Title  Pt. will perform light home making tasks with minA    Baseline  Pt. continues to have difficulty with folding laundry, washing dishes while standing at the sinkside. Pt. is able to make a sandwich, and heat things up in th microwave.    Time  12    Period  Weeks    Status  On-going      OT LONG TERM GOAL #6   Title  Pt. will write his name efficiently with 100% legibility    Baseline   60% legibility for name.    Time  12    Period  Weeks    Status  On-going            Plan - 10/28/17 1433    Clinical Impression Statement Pt. reports that he has been approved, and is receiving more hours for a personal aide through the [employer] program. Pt. reports that the loan for his trailer came through. Pt. reports he will be moving into a  brand new trailer which is handicapped accessible in 3 months.  Pt. continues to work on improving UE functioning for improve ADLs, and IADLs.    Occupational performance deficits (Please refer to evaluation for details):  ADL's;IADL's    Rehab Potential  Fair    OT Frequency  2x / week    OT Duration  12 weeks    OT Treatment/Interventions  Self-care/ADL training;Patient/family education;DME and/or AE instruction;Therapeutic exercise;Moist Heat;Neuromuscular education    Clinical Decision Making  Several treatment options, min-mod task modification necessary    Consulted and Agree with Plan of Care  Patient       Patient will benefit from skilled therapeutic intervention in order to improve the following deficits and impairments:  Pain, Decreased cognition, Decreased activity tolerance, Impaired UE functional use, Decreased range of motion, Decreased coordination, Decreased endurance  Visit Diagnosis: Muscle weakness (generalized)  Other lack of  coordination    Problem List Patient Active Problem List   Diagnosis Date Noted  . Acute CVA (  cerebrovascular accident) ([place]) 05/02/2016  . Left-sided weakness 05/01/2016    Harrel Carina, MS, OTR/L 10/28/2017, 2:41 PM  [place] [employer] MAIN [facility] SERVICES [address] [place], [place], [address] Phone: [phone]   Fax:  [phone]  Name: Curtis Powell MRN: [id number] Date of Birth: [birth year]

## 2017-10-28 NOTE — Therapy (Signed)
[place] MAIN [facility] SERVICES [address] [place], [place], [address] Phone: [phone]   Fax:  [phone]  Physical Therapy Treatment  Patient Details  Name: Curtis Powell MRN: [id number] Date of Birth: April 22, 1965 Referring Provider: Dr. Larinda Buttery   Encounter Date: 10/28/2017  PT End of Session - 10/28/17 1352    Visit Number  55    Number of Visits  75    Date for PT Re-Evaluation  12/16/17    Authorization Type  1/10    Authorization Time Period  Start of reporting period 7/16    PT Start Time  1344    PT Stop Time  1429    PT Time Calculation (min)  45 min    Equipment Utilized During Treatment  Gait belt    Activity Tolerance  Patient tolerated treatment well;No increased pain    Behavior During Therapy  WFL for tasks assessed/performed       Past Medical History:  Diagnosis Date  . GERD (gastroesophageal reflux disease)   . Hyperlipidemia   . Hypertension   . Obesity   . Stroke [facility]     Past Surgical History:  Procedure Laterality Date  . BRAIN SURGERY      There were no vitals filed for this visit.  Subjective Assessment - 10/28/17 1350    Subjective  Patient reports walking from his house to his neighbors house today. Nurse came to house to assess need for care at home level. Patient reports no new incidences of groin pain.     Pertinent History   Patient is a pleasant 52 year old male who presents to physical therapy for weakness and immobility secondary to CVA.  Had a stroke in Feb 2018. He was previously fully independent, but this stroke caused severe residual deficits, mainly on the right side as well as speech, and now he is unable to walk or perform most of his ADLs on his own. Entire right side is very weak. He still has a little difficulty with speech but his swallowing is improved to baseline. His mom had to move in with him and now is his main caregiver.     Limitations   Sitting;Lifting;Standing;Walking;House hold activities    How long can you sit comfortably?  5 minutes    How long can you stand comfortably?  5 minutes    How long can you walk comfortably?  10 ft    Patient Stated Goals  Walk without walker, walk further with walker, get some strength in back     Currently in Pain?  No/denies       Nustep Lvl 4  strengthening and functional capacity in a non ambulatory position   Step over and back half foam roller: 8x each leg ; buckling of R knee with stepover of LLE.   Standing  Marches in // bars and CGA; 10x each leg, cues for setting R knee prior to lifting left to decrease buckling.    Standing side steps in // bars with CGA 10x each leg  Static balance 3x30 seconds in // bars with no UE support. : frequent posterior LOB  Standing balloon taps x 20 with SUE support   5lb ankle weights seated;  LAQ 5 second holds 10x each leg; 2 sets    Seated marching focus on high knees 10x each leg; 2 sets        Pt. response to medical necessity:  Patient will continue to benefit  from skilled physical therapy to improve strength balance and gait safety                         PT Education - 10/28/17 1351    Education provided  Yes    Education Details  exercise technique    Person(s) Educated  Patient    Methods  Explanation;Demonstration;Verbal cues    Comprehension  Verbalized understanding;Returned demonstration       PT Short Term Goals - 07/16/17 1531      PT SHORT TERM GOAL #1   Title  Patient will be independent in home exercise program to improve strength/mobility for better functional independence with ADLs.    Baseline  hep compliant    Time  2    Period  Weeks    Status  Achieved      PT SHORT TERM GOAL #2   Title  Patient will require min cueing for STS transfer with CGA for increased independence with mobility.     Baseline  CGA    Time  2    Period  Weeks    Status  Achieved      PT SHORT TERM  GOAL #3   Title  Patient will maintain upright posture for > 15 seconds to demonstrate strengthened postural control muscualture     Baseline  18 seconds     Time  2    Period  Weeks    Status  Achieved      PT SHORT TERM GOAL #4   Title  Patient will perform         PT Long Term Goals - 10/21/17 1309      PT LONG TERM GOAL #1   Title  Patient will perform bed mobility with Supervision to increase independent mobility    Baseline  able to perform mod I for increased time , occasional Min A for scooting up in bed; 7/16: min guard for safety with increased effort and time, cues to scoot back in bed before sit>supine    Time  8    Period  Weeks    Status  Partially Met      PT LONG TERM GOAL #2   Title  Patient (< 16 years old) will complete five times sit to stand test in < 10 seconds indicating an increased LE strength and improved balance.    Baseline  2/18: 30 seconds from plinth chair height single UE on seat and single on walker ; 32 seconds from plinth with single UE on seat and single on walker. 4/10: 26 seconds from [facility] with one hand on walker5/29: 22.2 seconds with one hand on walker one hand on WC; 7/16: one hand on walker one hand on WC 18.96 seconds; pt achieving 75% of full upright standing    Time  8    Period  Weeks    Status  Partially Met      PT LONG TERM GOAL #3   Title  Patient will ambulate 40 ft with walker with no breaks to allow for increased mobility within home.     Baseline  12/26: 29 ft with walker and WC follow with no rest breaks; 2/18: multiple trials previous session of >100 ft     Time  8    Period  Weeks    Status  Achieved      PT LONG TERM GOAL #4   Title  Patient will increase BLE gross strength  to 4+/5 as to improve functional strength for independent gait, increased standing tolerance and increased ADL ability.    Baseline   4-/5 RLE,     Time  8    Period  Weeks    Status  Partially Met      PT LONG TERM GOAL #5   Title  Patient will  increase lower extremity functional scale to >40/80 to demonstrate improved functional mobility and increased tolerance with ADLs.     Baseline  14/80; 12/26: 21/80; 2/18: 28/80  4/10; 25/80 5/29: 27/80; 7/16: 29/80    Time  8    Period  Weeks    Status  Partially Met      PT LONG TERM GOAL #6   Title  Patient will walk through a door involving one small step up/down to increase functional independence.     Baseline  require CGA and min Verbal cueing with BUE support in // bars at this time    Time  8    Period  Weeks    Status  Partially Met      PT LONG TERM GOAL #7   Title  Patient will ambulate 40 ft with least assistive device and Supervision with no breaks to allow for increased mobility within home.     Baseline  requires use of walker     Time  8    Period  Weeks    Status  Partially Met      PT LONG TERM GOAL #8   Title  Patient will perform 10 MWT in < 1 minute to demonstrate improved ambulatory velocity and capacity.     Baseline  4/10: 2 minutes 45 seconds; 5/29: 2 minute 31 seconds; 3 minutes 22 seconds    Time  8    Period  Weeks    Status  On-going            Plan - 10/28/17 1420    Clinical Impression Statement  Patient presents with frequent R knee buckling requiring cuing to set and place R leg prior to lifting left. Standing balance is challenged by excessive trunk flexion at this time.  Patient will continue to benefit from skilled physical therapy to improve strength balance and gait safety    Clinical Impairments Affecting Rehab Potential  hx of HTN, HLD, CVA, learning disability, Diabetes, brain tumor,     PT Frequency  2x / week    PT Duration  8 weeks    PT Treatment/Interventions  ADLs/Self Care Home Management;Aquatic Therapy;Ultrasound;Moist Heat;Traction;DME Instruction;Gait training;Stair training;Functional mobility training;Therapeutic activities;Therapeutic exercise;Orthotic Fit/Training;Neuromuscular re-education;Balance training;Patient/family  education;Manual techniques;Wheelchair mobility training;Passive range of motion;Energy conservation;Taping;Visual/perceptual remediation/compensation    PT Next Visit Plan  floor recovery; pt requests to review HEP and to have handout provided     PT Home Exercise Plan  TrA contraction     Consulted and Agree with Plan of Care  Patient       Patient will benefit from skilled therapeutic intervention in order to improve the following deficits and impairments:  Abnormal gait, Decreased activity tolerance, Decreased balance, Decreased knowledge of precautions, Decreased endurance, Decreased coordination, Decreased knowledge of use of DME, Decreased mobility, Decreased range of motion, Difficulty walking, Decreased safety awareness, Decreased strength, Impaired flexibility, Impaired perceived functional ability, Impaired tone, Postural dysfunction, Improper body mechanics, Pain  Visit Diagnosis: Muscle weakness (generalized)  Other lack of coordination  Hemiplegia and hemiparesis following cerebral infarction affecting right dominant side (HCC)  Other abnormalities of gait and  mobility     Problem List Patient Active Problem List   Diagnosis Date Noted  . Acute CVA (cerebrovascular accident) ([place]) 05/02/2016  . Left-sided weakness 05/01/2016   Janna Arch, PT, DPT    10/28/2017, 2:31 PM  [place] MAIN [facility] SERVICES [address] [place], [place], [address] Phone: [phone]   Fax:  [phone]  Name: Curtis Powell MRN: [id number] Date of Birth: 11/11/1965

## 2017-10-30 ENCOUNTER — Encounter: Payer: Self-pay | Admitting: Occupational Therapy

## 2017-10-30 ENCOUNTER — Other Ambulatory Visit: Payer: Self-pay

## 2017-10-30 ENCOUNTER — Ambulatory Visit: Payer: Medicare HMO | Admitting: Occupational Therapy

## 2017-10-30 ENCOUNTER — Ambulatory Visit: Payer: Medicare HMO

## 2017-10-30 DIAGNOSIS — M6281 Muscle weakness (generalized): Secondary | ICD-10-CM

## 2017-10-30 DIAGNOSIS — R278 Other lack of coordination: Secondary | ICD-10-CM

## 2017-10-30 DIAGNOSIS — I69351 Hemiplegia and hemiparesis following cerebral infarction affecting right dominant side: Secondary | ICD-10-CM

## 2017-10-30 NOTE — Therapy (Addendum)
[place] MAIN [facility] SERVICES [address] [place], [place], [address] Phone: [phone]   Fax:  [phone]  Occupational Therapy Treatment  Patient Details  Name: Curtis Powell MRN: [id number] Date of Birth: 20-Jun-1965 Referring Provider: Dr. Larinda Buttery   Encounter Date: 10/30/2017  OT End of Session - 10/30/17 1528    Visit Number  43    Number of Visits  28    Date for OT Re-Evaluation  01/14/18    Authorization Type  Visit 5 of 10 for progress reporting period starting 10/07/2017    OT Start Time  1515    OT Stop Time  1600    OT Time Calculation (min)  45 min    Activity Tolerance  Patient tolerated treatment well    Behavior During Therapy  [facility] for tasks assessed/performed       Past Medical History:  Diagnosis Date  . GERD (gastroesophageal reflux disease)   . Hyperlipidemia   . Hypertension   . Obesity   . Stroke [facility])     Past Surgical History:  Procedure Laterality Date  . BRAIN SURGERY      There were no vitals filed for this visit.  Subjective Assessment - 10/30/17 1527    Subjective   Pt. prefers to be called "Curtis Powell"    Patient is accompained by:  Family member    Pertinent History  Pt. is a 52 y.o. male who suffered a CVA on 05/01/2016. Pt. was admitted to the hospital. Once discharged, he received Home Health PT and OT services for about a month. Pt. has had multiple CVAs over the past 8 years, and has had multiple falls in the past 6 months. Pt. resides in an apartment. Pt. has caregivers for 80 hours. Pt.'s mother stays with pt. at night, and assists with IADL tasks.      Patient Stated Goals  To be able to throw a ball and dribble a ball.    Currently in Pain?  Yes    Pain Score  2     Pain Location  Hand    Pain Orientation  Right    Pain Descriptors / Indicators  Aching      OT Treatment  Therapeutic Exercise:  Pt. Tolerated AAROM/PROM for shoulder flexion. Pt. tolerated UE  therapeutic Ex. with 1.0# dowel for shoulder flexion, chest press, and circular motion. Pt. Reports1.5#  Dowel ex. for bilateral elbow flexion, and extension. 1# dumbbell weight. For wrist extension, and radial deviation.                       OT Education - 10/30/17 1528    Education provided  Yes    Education Details  handwriting, manipulation of small objects    Person(s) Educated  Patient    Methods  Explanation;Demonstration    Comprehension  Verbalized understanding;Returned demonstration;Verbal cues required;Need further instruction       OT Short Term Goals - 03/03/17 1459      OT SHORT TERM GOAL #1   Title  `        OT Long Term Goals - 10/22/17 6962      OT LONG TERM GOAL #1   Title  Pt. will UE functioning to be able to perfrom toilet hygiene with minimal assistance.    Baseline  Pt. is unable. 05/07/2017: Pt. continues to occassionally have difficulty.    Time  12    Period  Weeks  Status  On-going      OT LONG TERM GOAL #2   Title  Pt. will complete self grooming with minA    Baseline  Pt. continues to require extensive assist. Pt. is improving indedependent with oral care with set-up, however requires extensive assist for self-grooming tasls.    Time  12    Period  Weeks    Status  On-going      OT LONG TERM GOAL #3   Title  Pt. will perform self-feeding skills with modified indepndence    Baseline   Pt. is able to sccop, and spear the food off of the plate, however requires assist for the hand to mouth pattern. Pt. no longer needs to use built-up foam handle for utensils.    Time  12    Period  Weeks    Status  On-going      OT LONG TERM GOAL #4   Title  Pt. will perform self dressing with minA and A/E as needed.    Baseline  Pt. continues to require extensive assist: Pt. continues to have difficulty perfroming UE,a nd LE dressing.    Time  12    Period  Weeks    Status  On-going      OT LONG TERM GOAL #5   Title  Pt. will perform  light home making tasks with minA    Baseline  Pt. continues to have difficulty with folding laundry, washing dishes while standing at the sinkside. Pt. is able to make a sandwich, and heat things up in th microwave.    Time  12    Period  Weeks    Status  On-going      OT LONG TERM GOAL #6   Title  Pt. will write his name efficiently with 100% legibility    Baseline   60% legibility for name.    Time  12    Period  Weeks    Status  On-going            Plan - 10/30/17 1528    Clinical Impression Statement  Pt. has 2/10 right hand pain which he reports just started while he was in the waiting room. Pt. reports that it is from the cold air. Pt. got approved for more hours for a personal aide. Pt. reports they will have increased hours. Pt. continues to work on improving BUE ROM, strength, and [place] skills to assist with improving ADL, and IADL tasks.    Occupational performance deficits (Please refer to evaluation for details):  ADL's;IADL's    Rehab Potential  Fair    OT Frequency  2x / week    OT Duration  12 weeks    OT Treatment/Interventions  Self-care/ADL training;Patient/family education;DME and/or AE instruction;Therapeutic exercise;Moist Heat;Neuromuscular education    Clinical Decision Making  Several treatment options, min-mod task modification necessary    Consulted and Agree with Plan of Care  Patient       Patient will benefit from skilled therapeutic intervention in order to improve the following deficits and impairments:  Pain, Decreased cognition, Decreased activity tolerance, Impaired UE functional use, Decreased range of motion, Decreased coordination, Decreased endurance  Visit Diagnosis: Muscle weakness (generalized)  Other lack of coordination    Problem List Patient Active Problem List   Diagnosis Date Noted  . Acute CVA (cerebrovascular accident) ([place]) 05/02/2016  . Left-sided weakness 05/01/2016    Harrel Carina, MS, OTR/L 10/30/2017, 3:51  PM  [place]  [place], [place], [address] Phone: [phone]   Fax:  [phone]  Name: Curtis Powell MRN: [id number] Date of Birth: 1966-03-07

## 2017-10-30 NOTE — Therapy (Signed)
[place] MAIN [facility] SERVICES [address] [place], [place], [address] Phone: [phone]   Fax:  [phone]  Physical Therapy Treatment  Patient Details  Name: Curtis Powell MRN: [id number] Date of Birth: November 10, 1965 Referring Provider: Dr. Larinda Buttery   Encounter Date: 10/30/2017  PT End of Session - 10/30/17 1629    Visit Number  77    Number of Visits  75    Date for PT Re-Evaluation  12/16/17    Authorization Type  3/10    Authorization Time Period  Start of reporting period 7/16    PT Start Time  1601    PT Stop Time  1645    PT Time Calculation (min)  44 min    Equipment Utilized During Treatment  Gait belt    Activity Tolerance  Patient tolerated treatment well;No increased pain    Behavior During Therapy  WFL for tasks assessed/performed       Past Medical History:  Diagnosis Date  . GERD (gastroesophageal reflux disease)   . Hyperlipidemia   . Hypertension   . Obesity   . Stroke [facility])     Past Surgical History:  Procedure Laterality Date  . BRAIN SURGERY      There were no vitals filed for this visit.  Subjective Assessment - 10/30/17 1620    Subjective  Patient reports he now has a fall alert. Walked to [employer] today and back.     Pertinent History   Patient is a pleasant 52 year old male who presents to physical therapy for weakness and immobility secondary to CVA.  Had a stroke in Feb 2018. He was previously fully independent, but this stroke caused severe residual deficits, mainly on the right side as well as speech, and now he is unable to walk or perform most of his ADLs on his own. Entire right side is very weak. He still has a little difficulty with speech but his swallowing is improved to baseline. His mom had to move in with him and now is his main caregiver.     Limitations  Sitting;Lifting;Standing;Walking;House hold activities    How long can you sit comfortably?  5 minutes    How long  can you stand comfortably?  5 minutes    How long can you walk comfortably?  10 ft    Patient Stated Goals  Walk without walker, walk further with walker, get some strength in back     Currently in Pain?  Yes    Pain Score  2     Pain Location  Hand    Pain Orientation  Right    Pain Descriptors / Indicators  Aching    Pain Type  Acute pain    Pain Onset  More than a month ago    Pain Frequency  Intermittent       Step over and back half foam roller: 8x each leg ; buckling of R knee with stepover of LLE.    Standing  Marches in // bars and CGA; 10x each leg, cues for setting R knee prior to lifting left to decrease buckling.  2 sets    Standing side steps in // bars with CGA 10x each leg; 2 sets   Static balance 3x30 seconds in // bars with no UE support. : frequent posterior LOB  Standing lunge stretch 2x30 seconds BLE    Standing balloon taps x 20 with SUE support  4" step toe taps 10x  each leg ; BUE support     TrA activation seated 10x 3 second holds   Bathroom mobility: STS, static stand, dressing.         Pt. response to medical necessity:  Patient will continue to benefit from skilled physical therapy to improve strength balance and gait safety                         PT Education - 10/30/17 1621    Education provided  Yes    Education Details  exercise technique     Person(s) Educated  Patient    Methods  Explanation;Demonstration;Verbal cues    Comprehension  Verbalized understanding;Returned demonstration       PT Short Term Goals - 07/16/17 1531      PT SHORT TERM GOAL #1   Title  Patient will be independent in home exercise program to improve strength/mobility for better functional independence with ADLs.    Baseline  hep compliant    Time  2    Period  Weeks    Status  Achieved      PT SHORT TERM GOAL #2   Title  Patient will require min cueing for STS transfer with CGA for increased independence with mobility.      Baseline  CGA    Time  2    Period  Weeks    Status  Achieved      PT SHORT TERM GOAL #3   Title  Patient will maintain upright posture for > 15 seconds to demonstrate strengthened postural control muscualture     Baseline  18 seconds     Time  2    Period  Weeks    Status  Achieved      PT SHORT TERM GOAL #4   Title  Patient will perform         PT Long Term Goals - 10/21/17 1309      PT LONG TERM GOAL #1   Title  Patient will perform bed mobility with Supervision to increase independent mobility    Baseline  able to perform mod I for increased time , occasional Min A for scooting up in bed; 7/16: min guard for safety with increased effort and time, cues to scoot back in bed before sit>supine    Time  8    Period  Weeks    Status  Partially Met      PT LONG TERM GOAL #2   Title  Patient (< 56 years old) will complete five times sit to stand test in < 10 seconds indicating an increased LE strength and improved balance.    Baseline  2/18: 30 seconds from plinth chair height single UE on seat and single on walker ; 32 seconds from plinth with single UE on seat and single on walker. 4/10: 26 seconds from [facility] with one hand on walker5/29: 22.2 seconds with one hand on walker one hand on WC; 7/16: one hand on walker one hand on WC 18.96 seconds; pt achieving 75% of full upright standing    Time  8    Period  Weeks    Status  Partially Met      PT LONG TERM GOAL #3   Title  Patient will ambulate 40 ft with walker with no breaks to allow for increased mobility within home.     Baseline  12/26: 29 ft with walker and WC follow with no rest breaks; 2/18: multiple trials  previous session of >100 ft     Time  8    Period  Weeks    Status  Achieved      PT LONG TERM GOAL #4   Title  Patient will increase BLE gross strength to 4+/5 as to improve functional strength for independent gait, increased standing tolerance and increased ADL ability.    Baseline   4-/5 RLE,     Time  8    Period   Weeks    Status  Partially Met      PT LONG TERM GOAL #5   Title  Patient will increase lower extremity functional scale to >40/80 to demonstrate improved functional mobility and increased tolerance with ADLs.     Baseline  14/80; 12/26: 21/80; 2/18: 28/80  4/10; 25/80 5/29: 27/80; 7/16: 29/80    Time  8    Period  Weeks    Status  Partially Met      PT LONG TERM GOAL #6   Title  Patient will walk through a door involving one small step up/down to increase functional independence.     Baseline  require CGA and min Verbal cueing with BUE support in // bars at this time    Time  8    Period  Weeks    Status  Partially Met      PT LONG TERM GOAL #7   Title  Patient will ambulate 40 ft with least assistive device and Supervision with no breaks to allow for increased mobility within home.     Baseline  requires use of walker     Time  8    Period  Weeks    Status  Partially Met      PT LONG TERM GOAL #8   Title  Patient will perform 10 MWT in < 1 minute to demonstrate improved ambulatory velocity and capacity.     Baseline  4/10: 2 minutes 45 seconds; 5/29: 2 minute 31 seconds; 3 minutes 22 seconds    Time  8    Period  Weeks    Status  On-going            Plan - 10/30/17 1706    Clinical Impression Statement  Patient demonstrating less frequent R knee buckling due to continued progress and compliance with strengthening interventions.  Patient will continue to benefit from skilled physical therapy to improve strength balance and gait safety    Clinical Impairments Affecting Rehab Potential  hx of HTN, HLD, CVA, learning disability, Diabetes, brain tumor,     PT Frequency  2x / week    PT Duration  8 weeks    PT Treatment/Interventions  ADLs/Self Care Home Management;Aquatic Therapy;Ultrasound;Moist Heat;Traction;DME Instruction;Gait training;Stair training;Functional mobility training;Therapeutic activities;Therapeutic exercise;Orthotic Fit/Training;Neuromuscular  re-education;Balance training;Patient/family education;Manual techniques;Wheelchair mobility training;Passive range of motion;Energy conservation;Taping;Visual/perceptual remediation/compensation    PT Next Visit Plan  floor recovery; pt requests to review HEP and to have handout provided     PT Home Exercise Plan  TrA contraction     Consulted and Agree with Plan of Care  Patient       Patient will benefit from skilled therapeutic intervention in order to improve the following deficits and impairments:  Abnormal gait, Decreased activity tolerance, Decreased balance, Decreased knowledge of precautions, Decreased endurance, Decreased coordination, Decreased knowledge of use of DME, Decreased mobility, Decreased range of motion, Difficulty walking, Decreased safety awareness, Decreased strength, Impaired flexibility, Impaired perceived functional ability, Impaired tone, Postural dysfunction, Improper body mechanics,  Pain  Visit Diagnosis: Muscle weakness (generalized)  Other lack of coordination  Hemiplegia and hemiparesis following cerebral infarction affecting right dominant side [facility])     Problem List Patient Active Problem List   Diagnosis Date Noted  . Acute CVA (cerebrovascular accident) ([place]) 05/02/2016  . Left-sided weakness 05/01/2016   Janna Arch, PT, DPT   10/30/2017, 5:07 PM  [place] MAIN [facility] SERVICES [address] [place], [place], [address] Phone: [phone]   Fax:  [phone]  Name: KEGAN MCKEITHAN MRN: [id number] Date of Birth: [birth year]

## 2017-11-03 ENCOUNTER — Ambulatory Visit: Payer: Medicare HMO

## 2017-11-03 DIAGNOSIS — M6281 Muscle weakness (generalized): Secondary | ICD-10-CM | POA: Diagnosis not present

## 2017-11-03 DIAGNOSIS — R2689 Other abnormalities of gait and mobility: Secondary | ICD-10-CM

## 2017-11-03 DIAGNOSIS — R278 Other lack of coordination: Secondary | ICD-10-CM

## 2017-11-03 DIAGNOSIS — I69351 Hemiplegia and hemiparesis following cerebral infarction affecting right dominant side: Secondary | ICD-10-CM

## 2017-11-03 NOTE — Therapy (Signed)
[place] MAIN [facility] SERVICES [address] [place], [place], [address] Phone: [phone]   Fax:  [phone]  Physical Therapy Evaluation  Patient Details  Name: HENRY UTSEY MRN: [id number] Date of Birth: Jun 09, 1965 Referring Provider: Dr. Larinda Buttery   Encounter Date: 11/03/2017  PT End of Session - 11/03/17 1755    Visit Number  1    Number of Visits  1    Date for PT Re-Evaluation  11/03/17    Authorization Type  w/c eval     PT Start Time  1520    PT Stop Time  1600    PT Time Calculation (min)  40 min    Equipment Utilized During Treatment  Gait belt       Past Medical History:  Diagnosis Date  . GERD (gastroesophageal reflux disease)   . Hyperlipidemia   . Hypertension   . Obesity   . Stroke [facility])     Past Surgical History:  Procedure Laterality Date  . BRAIN SURGERY      There were no vitals filed for this visit.   Subjective Assessment - 11/03/17 1750    Subjective  Patient presents for power wheelchair evaluation.     Pertinent History  Patient is a pleasant 52 year old male who presents to physical therapy for power wheelchair evaluation due to  CVA. Had a stroke in Feb 2018. He was previously fully independent, but this stroke caused severe residual deficits, mainly on the right side, and now he is unable to walk or perform majority of his ADLs on his own. Patient has a history of strokes in the past 8 years as well. PMH additionally includes: HTN, diabetes, and HLD. Patient has had occasional falls and has severe forward trunk flexion positioning. Pain in R shoulder is limiting in his mobility as well as his LE stability. Patient reports having accidents due to not being able to make it to the restroom in time due to difficulty negotiating with his manual wheelchair.      Limitations  Lifting;Standing;Walking;House hold activities    Currently in Pain?  Yes    Pain Score  7     Pain Location  Shoulder     Pain Orientation  Right    Pain Descriptors / Indicators  Stabbing    Pain Type  Chronic pain    Pain Onset  More than a month ago    Pain Frequency  Intermittent            PATIENT INFORMATION: This Evaluation form will serve as the LMN for the following suppliers:  [employer] Phone: [phone]   Reason for Referral:power wheelchair due to CVA  Patient/caregiver Goals: Patient was seen for face-to-face evaluation for new power wheelchair.  Also present was    Fredderick Phenix from [place]  to discuss recommendations and wheelchair options.  Further paperwork was completed and sent to vendor.  Patient appears to qualify for power mobility device at this time per objective findings.   MEDICAL HISTORY: Diagnosis: Cerebral Infarction: M62.81, R 27.8, I69.351, R26.89 Primary Diagnosis Onset: 2018 []Progressive Disease Relevant Past and Future Surgeries: Height: 4 ft 11 inch Weight: 200 lb  Explain and recent changes or trends in weight: fluctuates ~10 lb   Relevant History including falls:  Patient is a pleasant 52 year old male who presents to physical therapy for power wheelchair evaluation due to  CVA. Had a stroke  in Feb 2018. He was previously fully independent, but this stroke caused severe residual deficits, mainly on the right side, and now he is unable to walk or perform majority of his ADLs on his own. Patient has a history of strokes in the past 8 years as well. PMH additionally includes: HTN, diabetes, and HLD. Patient has had occasional falls and has severe forward trunk flexion positioning. Pain in R shoulder is limiting in his mobility as well as his LE stability. Patient reports having accidents due to not being able to make it to the restroom in time due to difficulty negotiating with his manual wheelchair.     HOME ENVIRONMENT: []House  []Condo/town home  [x]Apartment  []Assisted Living    []Lives  Alone [] Lives with Others                                                    Hours with caregiver: 4 hours a day 5 days, 2.5 hours on the weekends ( 2 days)   [x]Home is accessible to patient            Stairs  []Yes [x] No     Ramp [x]Yes []No Comments:  One level with grab bars in bathroom   COMMUNITY ADL: TRANSPORTATION: []Car    []Van    [x]Public Transportation   (transportation [place]) []Adapted w/c Lift   []Ambulance   []Other:       []Sits in wheelchair during transport  Employment/School:     Specific requirements pertaining to mobility                                                     Other:                                  Uses manual w/c in home however requires someone to push him if needing to rush.      FUNCTIONAL/SENSORY PROCESSING SKILLS:  Handedness:   [x]Right     []Left    []NA  Comments:                                 Media planner for Wheeled Mobility []Processing Skills are adequate for safe wheelchair operation  Areas of concern than may interfere with safe operation of wheelchair Description of problem   [] Attention to environment     []Judgment     [] Hearing  [] Vision or visual processing    []Motor Planning  [] Fluctuations in Behavior                                                   VERBAL COMMUNICATION: [x]WFL receptive [x] WFL expressive []Understandable  []Difficult to understand  []non-communicative [] Uses an augmented communication device    CURRENT SEATING / MOBILITY: Current Mobility Base:   []None  []Dependent  [x]Manual  []Scooter  []Power  Type of Control:                       Manufacturer:                         Size:    20x16x18                     Age:  ~2 years                        Current Condition of Mobility Base:   Wear and tear,                                                                                                                  Current Wheelchair components:   Wear and tear,  frequent break repair  ( one time per month)                                                                                                                               Describe posture in present seating system: forward slouched position                                                                            SENSATION and SKIN ISSUES: Sensation [x]Intact []Impaired []Absent   Level of sensation:                           Pressure Relief: Able to perform effective pressure relief :   [x]Yes  [x] No Method:  Tricep pushup or STS                                                                           If not, Why?:  Pressure relief is limited by R shoulder pain. When  nonpain able to perform, when painful is unable to perform.                                                                         Skin Issues/Skin Integrity Current Skin Issues   []Yes [x]No  []Intact [] Red area [] Open Area  []Scar Tissue []At risk from prolonged sitting  Where                              History of Skin Issues   [x]Yes []No  Where        buttocks  , groin                              When       Last year                                        Hx of skin flap surgeries []Yes []No  Where                  When                                                  Limited sitting tolerance []Yes [x]No Hours spent sitting in wheelchair daily:                                                         Complaint of Pain:  Please describe:  7/10  RUE (shoulder) : has to take tylenol every 3-4 hours for the past 4 days.                                                                                                          Swelling/Edema:              Bilateral ankle swelling noted with  Grade I edema  ADL STATUS (in reference to wheelchair use):  Indep Assist Unable  Indep with Equip Not assessed Comments  Dressing                  x                                                       Eating      x                                                                                                                        Toileting               x                                                                                                                Bathing              x                                                                                                                        Grooming/ Hygiene                  x  Meal Prep                x                                                                                                          IADLS                 x                                                                                                 Bowel Management: []Continent  []Incontinent  [x]Accidents Comments:           Did not make it to the bathroom in time frequently.                                        Bladder Management: []Continent  []Incontinent  [x]Accidents Comments:              Did not make it bathroom in time frequently                                 WHEELCHAIR SKILLS: Manual w/c Propulsion: []UE or LE strength and endurance sufficient to participate in ADLs using manual wheelchair Arm :  [x]left []right  []Both                                   Foot:   []left []right  []Both  Distance: 40 ft slowly with high pain (8/10) in R shoulder: limited shoulder extension to propel shoulder  Operate Scooter: [] Strength, hand grip, balance and transfer appropriate for use []Living environment is accessible for use of scooter  Operate Power w/c:  [] Std. Joystick   [] Alternative Controls Indep [x] Assist [] Dependent/ Unable [] N/A [] []Safe          [] Functional      Distance:                Bed  confined without wheelchair [] Yes [x] No; requires assistance with mobility though   STRENGTH/RANGE OF MOTION:  Range of Motion Strength  Shoulder    Flexion: L 76 R 42 pain            Abduction L 87  R 32 pain  Strength limited by pain                                                        Elbow         L and R full                                             Strength: L 4/5 R 2+/5 with pain                                                          Wrist/Hand        WFL                                                            Strength:   4/5                                                                   Hip          Limited hip extension due to hip forward flexion: Gross 4-/5 flexion, 2+/5 extension                                                                        Forward trunk flexion resulting in -10 hip extension                                           Knee           4-/5                                                                    WLF                                               Ankle 4-/5  WFL                                            MOBILITY/BALANCE:  [] Patient is totally dependent for mobility                                                                                               Balance Transfers Ambulation  Sitting Balance: Standing Balance: [] Independent [] Independent/Modified Independent  [] WFL     [] [facility] [] Supervision [] Supervision  [x] Uses UE for balance  [] Supervision [x] Min Assist : Requires BUE support and CGA  [x] Ambulates with Assist                           [] Min Assist [x] Min assist: requires BUE support and CGA  [] Mod Assist [] Ambulates with Device:  [x] RW   [] StW   [] Kasandra Knudsen   []                [] Mod Assist [] Mod assist [] Max assist   [] Max Assist [] Max assist [] Dependent [x] Indep. Short Distance Only  [] Unable [] Unable [] Lift / Sling Required Distance (in feet)  Requires RW and CGA with WC follow. Frequent R knee buckling requiring PT to assist into standing position.                             [] Sliding board [] Unable to Ambulate: (Explain:  Cardio Status:  []Intact  [x] Impaired   [] NA                              Respiratory Status:  [x]Intact   []Impaired   []NA                                     Orthotics/Prosthetics:n/a                                                                          Comments (Address manual vs power w/c vs scooter):      Patient is a pleasant 52 year old male who presents for power wheelchair evaluation for CVA. Patient has R sided hemiplegia with a history of multiple strokes. Patient has been seen by this therapist for physical therapy since last stroke in 2018. Although patient is able to ambulate with RW and CGA he requires wheelchair follow thereby limiting his mobility to wheelchair only when  home alone (caregiver only there 4 hours during weekday and 2 hours on weekend).  Patient currently utilizes a manual wheelchair but due to pain and limited mobility of RUE patient has limited tolerance for propulsion of chair and has had frequent accidents due to not making it to the rest room in time when alone. Patient has had to have current wheelchair breaks fixed approximately one time a month due to continuous loosening of locks. Patient would benefit from the cirrus plus power chair to allow for increased independence with ADLs, iADLs, and safe mobility in home. The foldable property of the cirrus plus will allow patient to take chair with him to doctor appointments. Pressure relief cushions of seat and back will benefit patient to decrease pressure wounds due to limited ability to self correct when R shoulder is flared.  Elevating leg raises will allow patient to reduce edema in bilateral ankles and improve mobility for transport and safe transfers. Patient would benefit from a power chair to improve mobility, decrease fall  risk, and improve quality of life.                                                         Anterior / Posterior Obliquity Rotation-Pelvis  PELVIS    []Neutral  [x] Posterior  [] Anterior     []WFL  []Right Elevated  []Left Elevated   []WFL  []Right Anterior []  Left Anterior    [] Fixed [] Partly Flexible [] Flexible  [] Other  [] Fixed  [] Partly Flexible  [] Flexible [] Other  [] Fixed  [] Partly Flexible  [] Flexible [] Other  TRUNK []WFL [x]Thoracic Kyphosis []Lumbar Lordosis   [x] WFL []Convex Right []Convex Left   []c-curve []s-curve []multiple  [] Neutral [] Left-anterior [] Right-anterior    [] Fixed [] Flexible [x] Partly Flexible       Other  [] Fixed [] Flexible [] Partly Flexible [] Other  [] Fixed           [] Flexible [] Partly Flexible [] Other   Position Windswept   HIPS  [x] Neutral [] Abduct [] ADduct [] Neutral [] Right [] Left       [] Fixed  [x] Partly Flexible             [] Dislocated [] Flexible [] Subluxed    [] Fixed [] Partly Flexible  [] Flexible [] Other              Foot Positioning Knee Positioning   Knees and  Feet  [x] WFL []Left []Right [x] [facility] []Left []Right   KNEES ROM concerns: ROM concerns:   & Dorsi-Flexed                    []Lt []Rt                                  FEET Plantar Flexed                  []Lt []Rt     Inversion                    []Lt []Rt     Eversion                    []  Lt []Rt    HEAD [x] Functional [x] Good Head Control   & [] Flexed         [] Extended [] Adequate Head Control   NECK [] Rotated  Lt  [] Lat Flexed Lt [] Rotated  Rt [] Lat Flexed Rt [] Limited Head Control    [] Cervical Hyperextension [] Absent  Head Control    SHOULDERS ELBOWS WRIST& HAND         Left     Right    Left     Right  U/E []Functional  Left            []Functional  Right      [facility]                [facility]           []Fisting             []Fisting     []elevated  Left []depressed  Left []elevated Right []depressed  Right      [x]protracted Left []retracted Left [x]protracted Right []retracted Right []subluxed  Left              []subluxed  Right         Goals for Wheelchair Mobility  [x] Independence with mobility in the home with motor related ADLs (MRADLs)  [x] Independence with MRADLs in the community [] Provide dependent mobility  [] Provide recline     []Provide tilt   Goals for Seating system [x] Optimize pressure distribution [x] Provide support needed to facilitate function or safety [x] Provide corrective forces to assist with maintaining or improving posture [] Accommodate client's posture: current seated postures and positions are not flexible or will not tolerate corrective forces [] Client to be independent with relieving pressure in the wheelchair []Enhance physiological function such as breathing, swallowing, digestion  Simulation ideas/Equipment trials:  Cirrus Plus                                                                                               State why other equipment was unsuccessful:         Unable to use scooter. Shoulder ROM and strength limited by pain reducing ability to propel a manual chair functionally throughout home, requiring patient to have assistance with propelling manual chair.                                                                         MOBILITY BASE RECOMMENDATIONS and JUSTIFICATION: MOBILITY COMPONENT JUSTIFICATION  Manufacturer:  [employer]          Model:cirrus plus               Size: Width 22          Seat Depth  18       [x]provide  transport from point A to B [x]promote Indep mobility  [x]is not a safe, functional ambulator [x]walker or cane inadequate []non-standard width/depth necessary to accommodate anatomical measurement []                            []Manual Mobility Base []non-functional ambulator    []Scooter/POV  []can safely operate  []can safely  transfer   []has adequate trunk stability  []cannot functionally propel manual w/c  [x]Power Mobility Base  []non-ambulatory  []cannot functionally propel manual wheelchair  [] cannot functionally and safely operate scooter/POV [x]can safely operate and willing to  []Stroller Base []infant/child  []unable to propel manual wheelchair []allows for growth []non-functional ambulator []non-functional UE []Indep mobility is not a goal at this time  []Tilt  []Forward                   []Backward                  []Powered tilt              []Manual tilt  []change position against gravitational force on head and shoulders  []change position for pressure relief/cannot weight shift []transfers  []management of tone []rest periods []control edema []facilitate postural control  []                                      []Recline  []Power recline on power base []Manual recline on manual base  []accommodate femur to back angle  []bring to full recline for ADL care  []change position for pressure relief/cannot weight shift []rest periods []repositioning for transfers or clothing/diaper /catheter changes []head positioning  []Lighter weight required []self- propulsion  []lifting []                                                []Heavy Duty required []user weight greater than 250# []extreme tone/ over active movement []broken frame on previous chair []                                    [] Back  [] Angle Adjustable [x] Custom molded                           []postural control []control of tone/spasticity []accommodation of range of motion []UE functional control []accommodation for seating system []                                         []provide lateral trunk support []accommodate deformity []provide posterior trunk support []provide lumbar/sacral support []support trunk in midline [x]Pressure relief over spinal processes  [x] Seat Cushion                       []impaired sensation   []decubitus ulcers present [x]history of pressure ulceration []prevent pelvic extension [x]low maintenance  []stabilize pelvis  []accommodate obliquity []accommodate multiple deformity []neutralize lower extremity position []increase pressure distribution []                                          []  Pelvic/thigh support  [] Lateral thigh guide [] Distal medial pad  [] Distal lateral pad [] pelvis in neutral []accommodate pelvis [] position upper legs [] alignment [] accommodate ROM [] decrease adduction []accommodate tone []removable for transfers []decrease abduction  [] Lateral trunk Supports [] Lt     [] Rt []decrease lateral trunk leaning []control tone []contour for increased contact []safety  []accommodate asymmetry []                                                [] Mounting hardware  []lateral trunk supports  []back   []seat []headrest      [] thigh support []fixed   []swing away []attach seat platform/cushion to w/c frame [x]attach back cushion to w/c frame []mount postural supports []mount headrest  []swing medial thigh support away []swing lateral supports away for transfers  []                                                    Armrests  []fixed []adjustable height []removable   [x]swing away  []flip back   []reclining []full length pads []desk    []pads tubular  []provide support with elbow at 90   []provide support for w/c tray []change of height/angles for variable activities []remove for transfers [x]allow to come closer to table top []remove for access to tables []                                              Hangers/ Leg rests  []60 []70 [x]90 [x]elevating []heavy duty  []articulating []fixed [x]lift off []swing away     []power [x]provide LE support  []accommodate to hamstring tightness []elevate legs during recline   []provide change in position for Legs []Maintain placement of feet on footplate []durability []enable  transfers [x]decrease edema []Accommodate lower leg length []                                        Foot support Footplate    []Lt  [] Rt  [x] Center mount [x]flip up                            []depth/angle adjustable []Amputee adapter    [] Lt     [] Rt []provide foot support []accommodate to ankle ROM []transfers []Provide support for residual extremity [] allow foot to go under wheelchair base [] decrease tone  []                                                [] Ankle strap/heel loops []support foot on foot support []decrease extraneous movement []provide input to heel  []protect foot  Tires: [x]pneumatic  []flat free inserts  []solid  []decrease maintenance  []prevent frequent flats []increase shock absorbency []decrease pain from  road shock []decrease spasms from road shock []                                             [] Headrest  []provide posterior head support []provide posterior neck support []provide lateral head support []provide anterior head support []support during tilt and recline []improve feeding   []improve respiration []placement of switches []safety  []accommodate ROM  []accommodate tone []improve visual orientation  [] Anterior chest strap [] Vest [] Shoulder retractors  []decrease forward movement of shoulder []accommodation of TLSO []decrease forward movement of trunk []decrease shoulder elevation []added abdominal support []alignment []assistance with shoulder control  []                                              Pelvic Positioner []Belt []SubASIS bar []Dual Pull []stabilize tone []decrease falling out of chair/ **will not Decrease potential for sliding due to pelvic tilting []prevent excessive rotation []pad for protection over boney prominence []prominence comfort []special pull angle to control rotation []                                                 Upper ExtremitySupport  []L   [] R []Arm trough   []hand support []  tray       []full tray []swivel mount []decrease edema      []decrease subluxation   []control tone   []placement for AAC/Computer/EADL []decrease gravitational pull on shoulders []provide midline positioning []provide support to increase UE function []provide hand support in natural position []provide work surface   POWER WHEELCHAIR CONTROLS  [x]Proportional  []Non-Proportional Type                                      []Left  [x]Right [x]provides access for controlling wheelchair   []lacks motor control to operate proportional drive control []unable to understand proportional controls  Actuator Control Module  []Single  []Multiple   []Allow the client to operate the power seat function(s) through the joystick control   []Safety Reset Switches []Used to change modes and stop the wheelchair when driving in latch mode    []Upgraded Electronics   []programming for accurate control []progressive Disease/changing condition []non-proportional drive control needed []Needed in order to operate power seat functions through joystick control   []Display box []Allows user to see in which mode and drive the wheelchair is set  []necessary for alternate controls    []Digital interface electronics []Allows w/c to operate when using alternative drive controls  []ASL Head Array []Allows client to operate wheelchair  through switches placed in tri-panel headrest  []Sip and puff with tubing kit []needed to operate sip and puff drive controls  []Upgraded tracking electronics []increase safety when driving []correct tracking when on uneven surfaces  []Mount for switches or joystick []Attaches switches to w/c  []Swing away for access or transfers []midline for optimal placement []provides for consistent access  []Attendant controlled joystick plus mount []safety []long distance driving []operation of seat functions []compliance with transportation regulations []  Rear wheel placement/Axle adjustability []None []semi adjustable []fully adjustable  []improved UE access to wheels []improved stability []changing angle in space for improvement of postural stability []1-arm drive access []amputee pad placement []                               Wheel rims/ hand rims  []metal   []plastic coated []oblique projections           []vertical projections []Provide ability to propel manual wheelchair  [] Increase self-propulsion with hand weakness/decreased grasp  Push handles []extended   []angle adjustable              []standard []caregiver access []caregiver assist []allows "hooking" to enable increased ability to perform ADLs or maintain balance  One armed device   []Lt   []Rt []enable propulsion of manual wheelchair with one arm   []                                           Brake/wheel lock extension [] Lt   [] Rt []increase indep in applying wheel locks   []Side guards []prevent clothing getting caught in wheel or becoming soiled [] prevent skin tears/abrasions  Battery:     2x35 A                                       [x]to power wheelchair                                                         Other:                                                                                                                        The above equipment has a life- long use expectancy. Growth and changes in medical and/or functional conditions would be the exceptions. This is to certify that the therapist has no financial relationship with durable medical provider or manufacturer. The therapist will not receive remuneration of any kind for the equipment recommended in this evaluation.   Patient has mobility limitation that significantly impairs safe, timely participation in one or more mobility related ADL's. (bathing, toileting, feeding, dressing, grooming, moving from room to room)  [x] Yes [] No  Will mobility device sufficiently improve ability to participate  and/or be aided in participation of MRADL's?      [x] Yes [] No  Can limitation be compensated for with use of a cane or walker?                                    []  Yes [x] No  Does patient or caregiver demonstrate ability/potential ability & willingness to safely use the mobility device?    [x] Yes [] No  Does patient's home environment support use of recommended mobility device?            [x] Yes [] No  Does patient have sufficient upper extremity function necessary to functionally propel a manual wheelchair?     [] Yes [x] No  Does patient have sufficient strength and trunk stability to safely operate a POV (scooter)?                                  [] Yes [x] No  Does patient need additional features/benefits provided by a power wheelchair for MRADL's in the home?        [x] Yes [] No  Does the patient demonstrate the ability to safely use a power wheelchair?                   [x] Yes [] No     Physician's Name Printed:                                                        32 Signature:  Date:     This is to certify that I, the above signed therapist have the following affiliations: [] This DME provider [] Manufacturer of recommended equipment [] Patient's long term care facility [x] None of the above  Therapist Name/Signature:    Janna Arch, PT, DPT                                        Date:11/03/17            Objective measurements completed on examination: See above findings.              PT Education - 11/03/17 1754    Education provided  Yes    Education Details  wheelchair eval     Person(s) Educated  Patient    Methods  Explanation;Demonstration    Comprehension  Verbalized understanding;Returned demonstration       PT Short Term Goals - 11/03/17 1837      PT SHORT TERM GOAL #1   Title  Pt and caregivers will understand PT recommendation and appropriate/safe use for wheelchair and seating for home use.    Baseline   MET    Time  1    Period  Days    Status  Achieved        PT Long Term Goals - 11/03/17 1837      PT LONG TERM GOAL #1   Title  Pt and caregivers will understand PT recommendation and appropriate/safe use for wheelchair and seating for home use.    Baseline  MET    Time  1    Period  Days    Status  Achieved             Plan - 11/03/17 1836    Clinical Impression Statement  Patient is a pleasant 52 year old male who presents for power wheelchair evaluation for CVA. Patient has R sided hemiplegia with  a history of multiple strokes. Patient has been seen by this therapist for physical therapy since last stroke in 2018. Although patient is able to ambulate with RW and CGA he requires wheelchair follow thereby limiting his mobility to wheelchair only when home alone (caregiver only there 4 hours during weekday and 2 hours on weekend).  Patient currently utilizes a manual wheelchair but due to pain and limited mobility of RUE patient has limited tolerance for propulsion of chair and has had frequent accidents due to not making it to the rest room in time when alone. Patient has had to have current wheelchair breaks fixed approximately one time a month due to continuous loosening of locks. Patient would benefit from the cirrus plus power chair to allow for increased independence with ADLs, iADLs, and safe mobility in home. The foldable property of the cirrus plus will allow patient to take chair with him to doctor appointments. Pressure relief cushions of seat and back will benefit patient to decrease pressure wounds due to limited ability to self correct when R shoulder is flared.  Elevating leg raises will allow patient to reduce edema in bilateral ankles and improve mobility for transport and safe transfers. Patient would benefit from a power chair to improve mobility, decrease fall risk, and improve quality of life.          PT Frequency  One time visit    PT Duration  Other (comment)     Family Member Consulted  mother       Patient will benefit from skilled therapeutic intervention in order to improve the following deficits and impairments:     Visit Diagnosis: Muscle weakness (generalized)  Other lack of coordination  Hemiplegia and hemiparesis following cerebral infarction affecting right dominant side ([place])  Other abnormalities of gait and mobility     Problem List Patient Active Problem List   Diagnosis Date Noted  . Acute CVA (cerebrovascular accident) ([place]) 05/02/2016  . Left-sided weakness 05/01/2016   Janna Arch, PT, DPT   11/03/2017, 6:38 PM  [place] MAIN [facility] SERVICES [address] [place], [place], [address] Phone: [phone]   Fax:  [phone]  Name: DORYAN BAHL MRN: [id number] Date of Birth: 12/26/1965

## 2017-11-04 ENCOUNTER — Ambulatory Visit: Payer: Medicare HMO | Admitting: Occupational Therapy

## 2017-11-04 ENCOUNTER — Encounter: Payer: Self-pay | Admitting: Occupational Therapy

## 2017-11-04 ENCOUNTER — Ambulatory Visit: Payer: Medicare HMO

## 2017-11-04 DIAGNOSIS — R278 Other lack of coordination: Secondary | ICD-10-CM

## 2017-11-04 DIAGNOSIS — M6281 Muscle weakness (generalized): Secondary | ICD-10-CM

## 2017-11-04 DIAGNOSIS — R2689 Other abnormalities of gait and mobility: Secondary | ICD-10-CM

## 2017-11-04 DIAGNOSIS — I69351 Hemiplegia and hemiparesis following cerebral infarction affecting right dominant side: Secondary | ICD-10-CM

## 2017-11-04 NOTE — Therapy (Signed)
[place] MAIN [facility] SERVICES [address] [place], [place], [address] Phone: [phone]   Fax:  [phone]  Physical Therapy Treatment  Patient Details  Name: Curtis Powell MRN: [id number] Date of Birth: 1965-07-06 Referring Provider: Dr. Larinda Buttery   Encounter Date: 11/04/2017  PT End of Session - 11/04/17 1438    Visit Number  54    Number of Visits  22    Date for PT Re-Evaluation  12/16/17    Authorization Type  4/10    Authorization Time Period  Start of reporting period 7/16    PT Start Time  1345    PT Stop Time  1429    PT Time Calculation (min)  44 min    Equipment Utilized During Treatment  Gait belt    Activity Tolerance  Patient tolerated treatment well;No increased pain    Behavior During Therapy  WFL for tasks assessed/performed       Past Medical History:  Diagnosis Date  . GERD (gastroesophageal reflux disease)   . Hyperlipidemia   . Hypertension   . Obesity   . Stroke [facility])     Past Surgical History:  Procedure Laterality Date  . BRAIN SURGERY      There were no vitals filed for this visit.  Subjective Assessment - 11/04/17 1356    Subjective  Patient presents with L shin pain in medial aspect. Pain occured last night/this morning and feels achy, worse when standing and weightbearing.     Pertinent History   Patient is a pleasant 52 year old male who presents to physical therapy for weakness and immobility secondary to CVA.  Had a stroke in Feb 2018. He was previously fully independent, but this stroke caused severe residual deficits, mainly on the right side as well as speech, and now he is unable to walk or perform most of his ADLs on his own. Entire right side is very weak. He still has a little difficulty with speech but his swallowing is improved to baseline. His mom had to move in with him and now is his main caregiver.     Limitations  Sitting;Lifting;Standing;Walking;House hold activities     How long can you sit comfortably?  5 minutes    How long can you stand comfortably?  5 minutes    How long can you walk comfortably?  10 ft    Patient Stated Goals  Walk without walker, walk further with walker, get some strength in back     Currently in Pain?  Yes    Pain Score  5     Pain Location  Leg    Pain Orientation  Left medial shin    Pain Descriptors / Indicators  Aching    Pain Type  Acute pain    Pain Onset  Yesterday    Pain Frequency  Intermittent      -tuning fork test - pressure to bottom of foot - squeeze test +tender to palpation -bruising or swelling  RTB df 15x each leg: 2 sets  RTB pf 15x each leg ; 2 sets RTB eversion 15x each leg, 2 sets RTB inversion 15x each leg, 2 sets  GTB Resisted hamstring stretch 15 x each leg, 2 sets  GTB resisted adduction 10x each leg, BLE   Ice cup massage 4 minutes   Pain decreased to 1/10 at end of session  PT Education - 11/04/17 1438    Education provided  Yes    Education Details  icing, exercise technique     Person(s) Educated  Patient    Methods  Explanation;Demonstration;Verbal cues    Comprehension  Verbalized understanding;Returned demonstration       PT Short Term Goals - 11/04/17 1442      PT SHORT TERM GOAL #1   Title  Patient will be independent in home exercise program to improve strength/mobility for better functional independence with ADLs.    Baseline  hep compliant    Time  2    Period  Weeks    Status  Achieved      PT SHORT TERM GOAL #2   Title  Patient will require min cueing for STS transfer with CGA for increased independence with mobility.     Baseline  CGA    Time  2    Period  Weeks    Status  Achieved      PT SHORT TERM GOAL #3   Title  Patient will maintain upright posture for > 15 seconds to demonstrate strengthened postural control muscualture     Baseline  18 seconds     Time  2    Period  Weeks    Status  Achieved      PT SHORT TERM  GOAL #4   Title  Patient will perform         PT Long Term Goals - 11/04/17 1441      PT LONG TERM GOAL #1   Title  Patient will perform bed mobility with Supervision to increase independent mobility    Baseline  able to perform mod I for increased time , occasional Min A for scooting up in bed; 7/16: min guard for safety with increased effort and time, cues to scoot back in bed before sit>supine    Time  8    Period  Weeks    Status  Partially Met      PT LONG TERM GOAL #2   Title  Patient (< 11 years old) will complete five times sit to stand test in < 10 seconds indicating an increased LE strength and improved balance.    Baseline  2/18: 30 seconds from plinth chair height single UE on seat and single on walker ; 32 seconds from plinth with single UE on seat and single on walker. 4/10: 26 seconds from [facility] with one hand on walker5/29: 22.2 seconds with one hand on walker one hand on WC; 7/16: one hand on walker one hand on WC 18.96 seconds; pt achieving 75% of full upright standing    Time  8    Period  Weeks    Status  Partially Met      PT LONG TERM GOAL #3   Title  Patient will ambulate 40 ft with walker with no breaks to allow for increased mobility within home.     Baseline  12/26: 29 ft with walker and WC follow with no rest breaks; 2/18: multiple trials previous session of >100 ft     Time  8    Period  Weeks    Status  Achieved      PT LONG TERM GOAL #4   Title  Patient will increase BLE gross strength to 4+/5 as to improve functional strength for independent gait, increased standing tolerance and increased ADL ability.    Baseline   4-/5 RLE,     Time  8  Period  Weeks    Status  Partially Met      PT LONG TERM GOAL #5   Title  Patient will increase lower extremity functional scale to >40/80 to demonstrate improved functional mobility and increased tolerance with ADLs.     Baseline  14/80; 12/26: 21/80; 2/18: 28/80  4/10; 25/80 5/29: 27/80; 7/16: 29/80    Time  8     Period  Weeks    Status  Partially Met      PT LONG TERM GOAL #6   Title  Patient will walk through a door involving one small step up/down to increase functional independence.     Baseline  require CGA and min Verbal cueing with BUE support in // bars at this time    Time  8    Period  Weeks    Status  Partially Met      PT LONG TERM GOAL #7   Title  Patient will ambulate 40 ft with least assistive device and Supervision with no breaks to allow for increased mobility within home.     Baseline  requires use of walker     Time  8    Period  Weeks    Status  Partially Met      PT LONG TERM GOAL #8   Title  Patient will perform 10 MWT in < 1 minute to demonstrate improved ambulatory velocity and capacity.     Baseline  4/10: 2 minutes 45 seconds; 5/29: 2 minute 31 seconds; 3 minutes 22 seconds    Time  8    Period  Weeks    Status  On-going            Plan - 11/04/17 1441    Clinical Impression Statement   Patient presents with L shin pain in medial aspect that worsens with weight bearing. Origin of pain was evaluated with no pain with tuning fork or hit to bottom of foot. Pain decreased after gentle exercise and icing to 1/10 pain. Patient educated on monitoring pain and not pushing when it is inflammed. Patient will continue to benefit from skilled physical therapy to improve strength balance and gait safety    Clinical Impairments Affecting Rehab Potential  hx of HTN, HLD, CVA, learning disability, Diabetes, brain tumor,     PT Frequency  2x / week    PT Duration  8 weeks    PT Treatment/Interventions  ADLs/Self Care Home Management;Aquatic Therapy;Ultrasound;Moist Heat;Traction;DME Instruction;Gait training;Stair training;Functional mobility training;Therapeutic activities;Therapeutic exercise;Orthotic Fit/Training;Neuromuscular re-education;Balance training;Patient/family education;Manual techniques;Wheelchair mobility training;Passive range of motion;Energy  conservation;Taping;Visual/perceptual remediation/compensation    PT Next Visit Plan  floor recovery; pt requests to review HEP and to have handout provided     PT Home Exercise Plan  TrA contraction     Consulted and Agree with Plan of Care  Patient       Patient will benefit from skilled therapeutic intervention in order to improve the following deficits and impairments:  Abnormal gait, Decreased activity tolerance, Decreased balance, Decreased knowledge of precautions, Decreased endurance, Decreased coordination, Decreased knowledge of use of DME, Decreased mobility, Decreased range of motion, Difficulty walking, Decreased safety awareness, Decreased strength, Impaired flexibility, Impaired perceived functional ability, Impaired tone, Postural dysfunction, Improper body mechanics, Pain  Visit Diagnosis: Muscle weakness (generalized)  Other lack of coordination  Hemiplegia and hemiparesis following cerebral infarction affecting right dominant side ([facility])  Other abnormalities of gait and mobility     Problem List Patient Active Problem List  Diagnosis Date Noted  . Acute CVA (cerebrovascular accident) ([place]) 05/02/2016  . Left-sided weakness 05/01/2016   Janna Arch, PT, DPT   11/04/2017, 2:42 PM  [place] MAIN [facility] SERVICES [address] [place], [place], [address] Phone: [phone]   Fax:  [phone]  Name: DELYNN PURSLEY MRN: [id number] Date of Birth: [birth year]

## 2017-11-04 NOTE — Therapy (Signed)
[place] MAIN [facility] SERVICES [address] [place], [place], [address] Phone: [phone]   Fax:  [phone]  Occupational Therapy Treatment  Patient Details  Name: Curtis Powell MRN: [id number] Date of Birth: [birth year] Referring Provider: Dr. Larinda Buttery   Encounter Date: 11/04/2017  OT End of Session - 11/04/17 1450    Visit Number  62    Number of Visits  57    Date for OT Re-Evaluation  01/14/18    Authorization Type  Visit 6 of 10 for progress reporting period starting 10/07/2017    OT Start Time  1438    OT Stop Time  1515    OT Time Calculation (min)  37 min    Activity Tolerance  Patient tolerated treatment well    Behavior During Therapy  [facility] for tasks assessed/performed       Past Medical History:  Diagnosis Date  . GERD (gastroesophageal reflux disease)   . Hyperlipidemia   . Hypertension   . Obesity   . Stroke [facility])     Past Surgical History:  Procedure Laterality Date  . BRAIN SURGERY      There were no vitals filed for this visit.  Subjective Assessment - 11/04/17 1449    Subjective   Pt. reports he is ready to move    Patient is accompained by:  Family member    Pertinent History  Pt. is a 52 y.o. male who suffered a CVA on 05/01/2016. Pt. was admitted to the hospital. Once discharged, he received Home Health PT and OT services for about a month. Pt. has had multiple CVAs over the past 8 years, and has had multiple falls in the past 6 months. Pt. resides in an apartment. Pt. has caregivers for 80 hours. Pt.'s mother stays with pt. at night, and assists with IADL tasks.      Patient Stated Goals  To be able to throw a ball and dribble a ball.    Currently in Pain?  No/denies    Pain Score  1        OT TREATMENT    Neuro muscular re-education:  Pt. worked on bilateral [facility] skills needed to grasp small resistive beads. Pt. worked on connecting the beads using a 3pt. pinch, and pt. Pinch grasp. Pt.  worked on disconnecting the resistive beads using a lateral pinch grasp, and 3pt. pinch grasp.  Pt. worked on Media planner, and efficiency. Pt. maintained a mature tripod grasp on a pen. Pt. Formulated the words using all capital letters written in large print.                         OT Education - 11/04/17 1450    Education provided  Yes    Education Details  handwriting, manipulation of small objects    Person(s) Educated  Patient    Methods  Explanation;Demonstration    Comprehension  Verbalized understanding;Returned demonstration;Verbal cues required;Need further instruction       OT Short Term Goals - 03/03/17 1459      OT SHORT TERM GOAL #1   Title  `        OT Long Term Goals - 10/22/17 1448      OT LONG TERM GOAL #1   Title  Pt. will UE functioning to be able to perfrom toilet hygiene with minimal assistance.    Baseline  Pt. is unable. 05/07/2017: Pt. continues to occassionally have difficulty.  Time  12    Period  Weeks    Status  On-going      OT LONG TERM GOAL #2   Title  Pt. will complete self grooming with minA    Baseline  Pt. continues to require extensive assist. Pt. is improving indedependent with oral care with set-up, however requires extensive assist for self-grooming tasls.    Time  12    Period  Weeks    Status  On-going      OT LONG TERM GOAL #3   Title  Pt. will perform self-feeding skills with modified indepndence    Baseline   Pt. is able to sccop, and spear the food off of the plate, however requires assist for the hand to mouth pattern. Pt. no longer needs to use built-up foam handle for utensils.    Time  12    Period  Weeks    Status  On-going      OT LONG TERM GOAL #4   Title  Pt. will perform self dressing with minA and A/E as needed.    Baseline  Pt. continues to require extensive assist: Pt. continues to have difficulty perfroming UE,a nd LE dressing.    Time  12    Period  Weeks    Status  On-going       OT LONG TERM GOAL #5   Title  Pt. will perform light home making tasks with minA    Baseline  Pt. continues to have difficulty with folding laundry, washing dishes while standing at the sinkside. Pt. is able to make a sandwich, and heat things up in th microwave.    Time  12    Period  Weeks    Status  On-going      OT LONG TERM GOAL #6   Title  Pt. will write his name efficiently with 100% legibility    Baseline   60% legibility for name.    Time  12    Period  Weeks    Status  On-going            Plan - 11/04/17 1451    Clinical Impression Statement  Pt. has 1/10 pain in his left LE, and right shoulder. Pt. continues to work on improving RUE strength,  fine motor coordination skills, and writing tasks. Pt. continues to work on improving ADL, and IADL functioning.    Occupational performance deficits (Please refer to evaluation for details):  ADL's;IADL's    Rehab Potential  Fair    OT Frequency  2x / week    OT Duration  12 weeks    OT Treatment/Interventions  Self-care/ADL training;Patient/family education;DME and/or AE instruction;Therapeutic exercise;Moist Heat;Neuromuscular education    Clinical Decision Making  Several treatment options, min-mod task modification necessary    Consulted and Agree with Plan of Care  Patient       Patient will benefit from skilled therapeutic intervention in order to improve the following deficits and impairments:  Pain, Decreased cognition, Decreased activity tolerance, Impaired UE functional use, Decreased range of motion, Decreased coordination, Decreased endurance  Visit Diagnosis: Muscle weakness (generalized)  Other lack of coordination    Problem List Patient Active Problem List   Diagnosis Date Noted  . Acute CVA (cerebrovascular accident) ([place]) 05/02/2016  . Left-sided weakness 05/01/2016    Harrel Carina , MS, OTR/L 11/04/2017, 3:02 PM  [place] MAIN [facility] SERVICES [address] [place], [place], [address] Phone: [phone]  Fax:  [phone]  Name: Curtis Powell MRN: [id number] Date of Birth: 08/15/1965

## 2017-11-06 ENCOUNTER — Ambulatory Visit: Payer: Medicare HMO | Attending: Family Medicine | Admitting: Occupational Therapy

## 2017-11-06 ENCOUNTER — Ambulatory Visit: Payer: Medicare HMO

## 2017-11-06 ENCOUNTER — Encounter: Payer: Self-pay | Admitting: Occupational Therapy

## 2017-11-06 DIAGNOSIS — I69351 Hemiplegia and hemiparesis following cerebral infarction affecting right dominant side: Secondary | ICD-10-CM

## 2017-11-06 DIAGNOSIS — R2689 Other abnormalities of gait and mobility: Secondary | ICD-10-CM | POA: Diagnosis present

## 2017-11-06 DIAGNOSIS — R278 Other lack of coordination: Secondary | ICD-10-CM | POA: Diagnosis present

## 2017-11-06 DIAGNOSIS — M6281 Muscle weakness (generalized): Secondary | ICD-10-CM | POA: Diagnosis present

## 2017-11-06 NOTE — Therapy (Signed)
[place] MAIN [facility] SERVICES [address] [place], [place], [address] Phone: [phone]   Fax:  [phone]  Occupational Therapy Treatment  Patient Details  Name: Curtis Powell MRN: [id number] Date of Birth: 1965-04-13 Referring Provider: Dr. Larinda Buttery   Encounter Date: 11/06/2017  OT End of Session - 11/06/17 1525    Visit Number  58    Number of Visits  20    Authorization Type  Visit 7 of 10 for progress reporting period starting 10/07/2017    OT Start Time  1515    OT Stop Time  1600    OT Time Calculation (min)  45 min    Activity Tolerance  Patient tolerated treatment well    Behavior During Therapy  [facility] for tasks assessed/performed       Past Medical History:  Diagnosis Date  . GERD (gastroesophageal reflux disease)   . Hyperlipidemia   . Hypertension   . Obesity   . Stroke [facility])     Past Surgical History:  Procedure Laterality Date  . BRAIN SURGERY      There were no vitals filed for this visit.  Subjective Assessment - 11/06/17 1524    Subjective   Pt. reports he is moving soon.    Patient is accompained by:  Family member    Pertinent History  Pt. is a 52 y.o. male who suffered a CVA on 05/01/2016. Pt. was admitted to the hospital. Once discharged, he received Home Health PT and OT services for about a month. Pt. has had multiple CVAs over the past 8 years, and has had multiple falls in the past 6 months. Pt. resides in an apartment. Pt. has caregivers for 80 hours. Pt.'s mother stays with pt. at night, and assists with IADL tasks.      Patient Stated Goals  To be able to throw a ball and dribble a ball.    Currently in Pain?  No/denies      OT TREATMENT    Neuro muscular re-education:  Pt. worked on right hand [facility] skills grasping 1/4" pegs, manipulating the pegs, and placing them in a pegboard placed on a tabletop surface. Pt. dropped several pegs, and had difficulty manipulating the pegs once they  dropped onto the pegboard. Pt. was able to easily grasps initially from a flat surface. Pt. Worked on removing the pegs from the pegboard while alternating thumb opposition to the tip of his 2nd digit through the thumb.  Therapeutic Exercise:  Pt. performed 1.5## dowel ex. For UE strengthening secondary to weakness. Bilateral shoulder flexion, chest press, circular patterns, and elbow flexion/extension were performed. Pt. worked with red theraband for bilateral horizontal abduction, elbow flexion, and extension.                          OT Education - 11/06/17 1525    Education provided  Yes    Education Details  UE strengthening    Person(s) Educated  Patient    Methods  Explanation;Demonstration    Comprehension  Verbalized understanding;Returned demonstration;Verbal cues required;Need further instruction        OT Long Term Goals - 10/22/17 2119      OT LONG TERM GOAL #1   Title  Pt. will UE functioning to be able to perfrom toilet hygiene with minimal assistance.    Baseline  Pt. is unable. 05/07/2017: Pt. continues to occassionally have difficulty.    Time  12  Period  Weeks    Status  On-going      OT LONG TERM GOAL #2   Title  Pt. will complete self grooming with minA    Baseline  Pt. continues to require extensive assist. Pt. is improving indedependent with oral care with set-up, however requires extensive assist for self-grooming tasls.    Time  12    Period  Weeks    Status  On-going      OT LONG TERM GOAL #3   Title  Pt. will perform self-feeding skills with modified indepndence    Baseline   Pt. is able to sccop, and spear the food off of the plate, however requires assist for the hand to mouth pattern. Pt. no longer needs to use built-up foam handle for utensils.    Time  12    Period  Weeks    Status  On-going      OT LONG TERM GOAL #4   Title  Pt. will perform self dressing with minA and A/E as needed.    Baseline  Pt. continues to  require extensive assist: Pt. continues to have difficulty perfroming UE,a nd LE dressing.    Time  12    Period  Weeks    Status  On-going      OT LONG TERM GOAL #5   Title  Pt. will perform light home making tasks with minA    Baseline  Pt. continues to have difficulty with folding laundry, washing dishes while standing at the sinkside. Pt. is able to make a sandwich, and heat things up in th microwave.    Time  12    Period  Weeks    Status  On-going      OT LONG TERM GOAL #6   Title  Pt. will write his name efficiently with 100% legibility    Baseline   60% legibility for name.    Time  12    Period  Weeks    Status  On-going            Plan - 11/06/17 1526    Clinical Impression Statement Pt.'s home care aide situation is getting ready to change soon. Pt. has made progress overall with tolerating BUE strengthening. Pt. reports no pain in his right shoulder today. Pt. continues to work on using his hands during self-care tasks, improving bilateral UE strength, and [facility] skills for improved functional hand use during ADLs, and IADLs.     Occupational performance deficits (Please refer to evaluation for details):  ADL's;IADL's    Rehab Potential  Fair    OT Frequency  2x / week    OT Duration  12 weeks    OT Treatment/Interventions  Self-care/ADL training;Patient/family education;DME and/or AE instruction;Therapeutic exercise;Moist Heat;Neuromuscular education    Clinical Decision Making  Several treatment options, min-mod task modification necessary    Consulted and Agree with Plan of Care  Patient       Patient will benefit from skilled therapeutic intervention in order to improve the following deficits and impairments:  Pain, Decreased cognition, Decreased activity tolerance, Impaired UE functional use, Decreased range of motion, Decreased coordination, Decreased endurance  Visit Diagnosis: Muscle weakness (generalized)  Other lack of coordination    Problem  List Patient Active Problem List   Diagnosis Date Noted  . Acute CVA (cerebrovascular accident) ([place]) 05/02/2016  . Left-sided weakness 05/01/2016    Harrel Carina, MS, OTR/L 11/06/2017, 3:40 PM  [place] MAIN  [place], [place], [address] Phone: [phone]   Fax:  [phone]  Name: SHARIQ PUIG MRN: [id number] Date of Birth: 1965-08-29

## 2017-11-06 NOTE — Therapy (Signed)
[place] MAIN [facility] SERVICES [address] [place], [place], [address] Phone: [phone]   Fax:  [phone]  Physical Therapy Treatment  Patient Details  Name: Curtis Powell MRN: [id number] Date of Birth: 1966-01-29 Referring Provider: Dr. Larinda Buttery   Encounter Date: 11/06/2017  PT End of Session - 11/06/17 1645    Visit Number  19    Number of Visits  47    Date for PT Re-Evaluation  12/16/17    Authorization Type  5/10    Authorization Time Period  Start of reporting period 7/16    PT Start Time  1602    PT Stop Time  1643    PT Time Calculation (min)  41 min    Equipment Utilized During Treatment  Gait belt    Activity Tolerance  Patient tolerated treatment well;No increased pain    Behavior During Therapy  WFL for tasks assessed/performed       Past Medical History:  Diagnosis Date  . GERD (gastroesophageal reflux disease)   . Hyperlipidemia   . Hypertension   . Obesity   . Stroke [facility])     Past Surgical History:  Procedure Laterality Date  . BRAIN SURGERY      There were no vitals filed for this visit.  Subjective Assessment - 11/06/17 1629    Subjective  Patient reports doing well. Having a good day today and compliant with HEP.     Pertinent History   Patient is a pleasant 52 year old male who presents to physical therapy for weakness and immobility secondary to CVA.  Had a stroke in Feb 2018. He was previously fully independent, but this stroke caused severe residual deficits, mainly on the right side as well as speech, and now he is unable to walk or perform most of his ADLs on his own. Entire right side is very weak. He still has a little difficulty with speech but his swallowing is improved to baseline. His mom had to move in with him and now is his main caregiver.     Limitations  Sitting;Lifting;Standing;Walking;House hold activities    How long can you sit comfortably?  5 minutes    How long can  you stand comfortably?  5 minutes    How long can you walk comfortably?  10 ft    Patient Stated Goals  Walk without walker, walk further with walker, get some strength in back     Currently in Pain?  No/denies       Attempt at "Git Up" line dance for functional mobility in engaging movement with four main components: 1: cowboy boogie: stepover with UE support to promote coordination of LE's and induce rhythmic stability in single limb support. Stomp at end of step over promotes weight shift and modified single limb stance.    2. [place]: lateral side step to left and right 2 x each direction, strengthen bilateral LE and allow for movements required for getting into and out of bathroom. At end of side steps raise arm up in the air to promote trunk extension for improved upright posture  3.butterfly: step toes in, step toes out : narrowing and widening BOS to challenge stability   4. Two step; forward step into tandem stance and then step back into lateral alignment; this promotes increased stability and challenges BOS for progressing functional mobility   Standing:   Step over and back theraband on floor 8x each leg; BUE support  Hip extension  kick backs 10x in standing each leg   Seated weighted ball (3000 Gr) twists 10x  Seated weighted ball (3000 Gr) raises for upright posture 10x   Seated pf GT B 15x each leg                         PT Education - 11/06/17 1638    Education provided  Yes    Education Details  exercise technique,     Person(s) Educated  Patient    Methods  Explanation;Demonstration;Verbal cues    Comprehension  Verbalized understanding;Returned demonstration       PT Short Term Goals - 11/04/17 1442      PT SHORT TERM GOAL #1   Title  Patient will be independent in home exercise program to improve strength/mobility for better functional independence with ADLs.    Baseline  hep compliant    Time  2    Period  Weeks    Status  Achieved       PT SHORT TERM GOAL #2   Title  Patient will require min cueing for STS transfer with CGA for increased independence with mobility.     Baseline  CGA    Time  2    Period  Weeks    Status  Achieved      PT SHORT TERM GOAL #3   Title  Patient will maintain upright posture for > 15 seconds to demonstrate strengthened postural control muscualture     Baseline  18 seconds     Time  2    Period  Weeks    Status  Achieved      PT SHORT TERM GOAL #4   Title  Patient will perform         PT Long Term Goals - 11/04/17 1441      PT LONG TERM GOAL #1   Title  Patient will perform bed mobility with Supervision to increase independent mobility    Baseline  able to perform mod I for increased time , occasional Min A for scooting up in bed; 7/16: min guard for safety with increased effort and time, cues to scoot back in bed before sit>supine    Time  8    Period  Weeks    Status  Partially Met      PT LONG TERM GOAL #2   Title  Patient (< 72 years old) will complete five times sit to stand test in < 10 seconds indicating an increased LE strength and improved balance.    Baseline  2/18: 30 seconds from plinth chair height single UE on seat and single on walker ; 32 seconds from plinth with single UE on seat and single on walker. 4/10: 26 seconds from [facility] with one hand on walker5/29: 22.2 seconds with one hand on walker one hand on WC; 7/16: one hand on walker one hand on WC 18.96 seconds; pt achieving 75% of full upright standing    Time  8    Period  Weeks    Status  Partially Met      PT LONG TERM GOAL #3   Title  Patient will ambulate 40 ft with walker with no breaks to allow for increased mobility within home.     Baseline  12/26: 29 ft with walker and WC follow with no rest breaks; 2/18: multiple trials previous session of >100 ft     Time  8    Period  Weeks  Status  Achieved      PT LONG TERM GOAL #4   Title  Patient will increase BLE gross strength to 4+/5 as to improve  functional strength for independent gait, increased standing tolerance and increased ADL ability.    Baseline   4-/5 RLE,     Time  8    Period  Weeks    Status  Partially Met      PT LONG TERM GOAL #5   Title  Patient will increase lower extremity functional scale to >40/80 to demonstrate improved functional mobility and increased tolerance with ADLs.     Baseline  14/80; 12/26: 21/80; 2/18: 28/80  4/10; 25/80 5/29: 27/80; 7/16: 29/80    Time  8    Period  Weeks    Status  Partially Met      PT LONG TERM GOAL #6   Title  Patient will walk through a door involving one small step up/down to increase functional independence.     Baseline  require CGA and min Verbal cueing with BUE support in // bars at this time    Time  8    Period  Weeks    Status  Partially Met      PT LONG TERM GOAL #7   Title  Patient will ambulate 40 ft with least assistive device and Supervision with no breaks to allow for increased mobility within home.     Baseline  requires use of walker     Time  8    Period  Weeks    Status  Partially Met      PT LONG TERM GOAL #8   Title  Patient will perform 10 MWT in < 1 minute to demonstrate improved ambulatory velocity and capacity.     Baseline  4/10: 2 minutes 45 seconds; 5/29: 2 minute 31 seconds; 3 minutes 22 seconds    Time  8    Period  Weeks    Status  On-going            Plan - 11/06/17 1646    Clinical Impression Statement  Patient demonstrated improved ability to perform modified tandem stance with repetition demonstrating improved coordination and stability. Patient fatigued as session progressed with occasional right knee buckling requiring seated rest breaks. Patient will continue to benefit from skilled physical therapy to improve strength balance and gait safety    Clinical Impairments Affecting Rehab Potential  hx of HTN, HLD, CVA, learning disability, Diabetes, brain tumor,     PT Frequency  2x / week    PT Duration  8 weeks    PT  Treatment/Interventions  ADLs/Self Care Home Management;Aquatic Therapy;Ultrasound;Moist Heat;Traction;DME Instruction;Gait training;Stair training;Functional mobility training;Therapeutic activities;Therapeutic exercise;Orthotic Fit/Training;Neuromuscular re-education;Balance training;Patient/family education;Manual techniques;Wheelchair mobility training;Passive range of motion;Energy conservation;Taping;Visual/perceptual remediation/compensation    PT Next Visit Plan  floor recovery; pt requests to review HEP and to have handout provided     PT Home Exercise Plan  TrA contraction     Consulted and Agree with Plan of Care  Patient       Patient will benefit from skilled therapeutic intervention in order to improve the following deficits and impairments:  Abnormal gait, Decreased activity tolerance, Decreased balance, Decreased knowledge of precautions, Decreased endurance, Decreased coordination, Decreased knowledge of use of DME, Decreased mobility, Decreased range of motion, Difficulty walking, Decreased safety awareness, Decreased strength, Impaired flexibility, Impaired perceived functional ability, Impaired tone, Postural dysfunction, Improper body mechanics, Pain  Visit Diagnosis: Muscle weakness (generalized)  Other lack of coordination  Hemiplegia and hemiparesis following cerebral infarction affecting right dominant side ([place])  Other abnormalities of gait and mobility     Problem List Patient Active Problem List   Diagnosis Date Noted  . Acute CVA (cerebrovascular accident) ([place]) 05/02/2016  . Left-sided weakness 05/01/2016   Janna Arch, PT, DPT   11/06/2017, 4:47 PM  [place] MAIN [facility] SERVICES [address] [place], [place], [address] Phone: [phone]   Fax:  [phone]  Name: Curtis Powell MRN: [id number] Date of Birth: [birth year]

## 2017-11-11 ENCOUNTER — Ambulatory Visit: Payer: Medicare HMO | Admitting: Occupational Therapy

## 2017-11-11 ENCOUNTER — Ambulatory Visit: Payer: Medicare HMO

## 2017-11-11 ENCOUNTER — Encounter: Payer: Self-pay | Admitting: Occupational Therapy

## 2017-11-11 DIAGNOSIS — M6281 Muscle weakness (generalized): Secondary | ICD-10-CM

## 2017-11-11 DIAGNOSIS — R278 Other lack of coordination: Secondary | ICD-10-CM

## 2017-11-11 DIAGNOSIS — I69351 Hemiplegia and hemiparesis following cerebral infarction affecting right dominant side: Secondary | ICD-10-CM

## 2017-11-11 DIAGNOSIS — R2689 Other abnormalities of gait and mobility: Secondary | ICD-10-CM

## 2017-11-11 NOTE — Therapy (Signed)
[place] MAIN [facility] SERVICES [address] [place], [place], [address] Phone: [phone]   Fax:  [phone]  Physical Therapy Treatment  Patient Details  Name: Curtis Powell MRN: [id number] Date of Birth: [birth year] Referring Provider: Dr. Larinda Buttery   Encounter Date: 11/11/2017  PT End of Session - 11/11/17 1444    Visit Number  9    Number of Visits  75    Date for PT Re-Evaluation  12/16/17    Authorization Type  6/10    Authorization Time Period  Start of reporting period 7/16    PT Start Time  1432    PT Stop Time  1515    PT Time Calculation (min)  43 min    Equipment Utilized During Treatment  Gait belt    Activity Tolerance  Patient tolerated treatment well;No increased pain    Behavior During Therapy  WFL for tasks assessed/performed       Past Medical History:  Diagnosis Date  . GERD (gastroesophageal reflux disease)   . Hyperlipidemia   . Hypertension   . Obesity   . Stroke [facility])     Past Surgical History:  Procedure Laterality Date  . BRAIN SURGERY      There were no vitals filed for this visit.  Subjective Assessment - 11/11/17 1434    Subjective  Patient reports bed broke last night. Reports he had to sleep in the recliner last night. Patient reports no pain and stiffness. Patient reports compliance with HEP    Pertinent History   Patient is a pleasant 52 year old male who presents to physical therapy for weakness and immobility secondary to CVA.  Had a stroke in Feb 2018. He was previously fully independent, but this stroke caused severe residual deficits, mainly on the right side as well as speech, and now he is unable to walk or perform most of his ADLs on his own. Entire right side is very weak. He still has a little difficulty with speech but his swallowing is improved to baseline. His mom had to move in with him and now is his main caregiver.     Limitations   Sitting;Lifting;Standing;Walking;House hold activities    How long can you sit comfortably?  5 minutes    How long can you stand comfortably?  5 minutes    How long can you walk comfortably?  10 ft    Patient Stated Goals  Walk without walker, walk further with walker, get some strength in back     Currently in Pain?  No/denies       Step over and back half foam roller: 8x each leg ; cues to stabilize R knee to reduce buckling of R knee with stepover of LLE.      Standing side steps in // bars with CGA 10x each leg; 2 sets   Standing reaching for different number stticky notes on mirror to promote upright balance , SUE support: reach ipsilaterally and contralaterally.   Static balance 3x30 seconds in // bars with no UE support.; no LOB   Standing lunge stretch 2x30 seconds BLE    Standing balloon taps x 20 with SUE support     TrA activation seated 10x 3 second holds   Seated soccer ball kicks for coordination and muscle activation control 10x each leg    Seated soccer ball goal kicks 10x each leg for accuracy of coordination and muscle activation.   Seated pf 10x each leg  GTB          Pt. response to medical necessity:  Patient will continue to benefit from skilled physical therapy to improve strength balance and gait safety                        PT Education - 11/11/17 1443    Education provided  Yes    Education Details  exercise technique     Person(s) Educated  Patient    Methods  Explanation;Demonstration;Verbal cues    Comprehension  Verbalized understanding;Returned demonstration       PT Short Term Goals - 11/04/17 1442      PT SHORT TERM GOAL #1   Title  Patient will be independent in home exercise program to improve strength/mobility for better functional independence with ADLs.    Baseline  hep compliant    Time  2    Period  Weeks    Status  Achieved      PT SHORT TERM GOAL #2   Title  Patient will require min cueing for STS  transfer with CGA for increased independence with mobility.     Baseline  CGA    Time  2    Period  Weeks    Status  Achieved      PT SHORT TERM GOAL #3   Title  Patient will maintain upright posture for > 15 seconds to demonstrate strengthened postural control muscualture     Baseline  18 seconds     Time  2    Period  Weeks    Status  Achieved      PT SHORT TERM GOAL #4   Title  Patient will perform         PT Long Term Goals - 11/04/17 1441      PT LONG TERM GOAL #1   Title  Patient will perform bed mobility with Supervision to increase independent mobility    Baseline  able to perform mod I for increased time , occasional Min A for scooting up in bed; 7/16: min guard for safety with increased effort and time, cues to scoot back in bed before sit>supine    Time  8    Period  Weeks    Status  Partially Met      PT LONG TERM GOAL #2   Title  Patient (< 39 years old) will complete five times sit to stand test in < 10 seconds indicating an increased LE strength and improved balance.    Baseline  2/18: 30 seconds from plinth chair height single UE on seat and single on walker ; 32 seconds from plinth with single UE on seat and single on walker. 4/10: 26 seconds from [facility] with one hand on walker5/29: 22.2 seconds with one hand on walker one hand on WC; 7/16: one hand on walker one hand on WC 18.96 seconds; pt achieving 75% of full upright standing    Time  8    Period  Weeks    Status  Partially Met      PT LONG TERM GOAL #3   Title  Patient will ambulate 40 ft with walker with no breaks to allow for increased mobility within home.     Baseline  12/26: 29 ft with walker and WC follow with no rest breaks; 2/18: multiple trials previous session of >100 ft     Time  8    Period  Weeks    Status  Achieved      PT LONG TERM GOAL #4   Title  Patient will increase BLE gross strength to 4+/5 as to improve functional strength for independent gait, increased standing tolerance and  increased ADL ability.    Baseline   4-/5 RLE,     Time  8    Period  Weeks    Status  Partially Met      PT LONG TERM GOAL #5   Title  Patient will increase lower extremity functional scale to >40/80 to demonstrate improved functional mobility and increased tolerance with ADLs.     Baseline  14/80; 12/26: 21/80; 2/18: 28/80  4/10; 25/80 5/29: 27/80; 7/16: 29/80    Time  8    Period  Weeks    Status  Partially Met      PT LONG TERM GOAL #6   Title  Patient will walk through a door involving one small step up/down to increase functional independence.     Baseline  require CGA and min Verbal cueing with BUE support in // bars at this time    Time  8    Period  Weeks    Status  Partially Met      PT LONG TERM GOAL #7   Title  Patient will ambulate 40 ft with least assistive device and Supervision with no breaks to allow for increased mobility within home.     Baseline  requires use of walker     Time  8    Period  Weeks    Status  Partially Met      PT LONG TERM GOAL #8   Title  Patient will perform 10 MWT in < 1 minute to demonstrate improved ambulatory velocity and capacity.     Baseline  4/10: 2 minutes 45 seconds; 5/29: 2 minute 31 seconds; 3 minutes 22 seconds    Time  8    Period  Weeks    Status  On-going            Plan - 11/11/17 1455    Clinical Impression Statement  Patient demonstrated decreased episodes of R knee buckling throughout session demonstrating increased R quad strengthening. Patient fatigued with standing posture exercises resulting in seated rest break between interventions.  Patient will continue to benefit from skilled physical therapy to improve strength balance and gait safety    Clinical Impairments Affecting Rehab Potential  hx of HTN, HLD, CVA, learning disability, Diabetes, brain tumor,     PT Frequency  2x / week    PT Duration  8 weeks    PT Treatment/Interventions  ADLs/Self Care Home Management;Aquatic Therapy;Ultrasound;Moist  Heat;Traction;DME Instruction;Gait training;Stair training;Functional mobility training;Therapeutic activities;Therapeutic exercise;Orthotic Fit/Training;Neuromuscular re-education;Balance training;Patient/family education;Manual techniques;Wheelchair mobility training;Passive range of motion;Energy conservation;Taping;Visual/perceptual remediation/compensation    PT Next Visit Plan  floor recovery; pt requests to review HEP and to have handout provided     PT Home Exercise Plan  TrA contraction     Consulted and Agree with Plan of Care  Patient       Patient will benefit from skilled therapeutic intervention in order to improve the following deficits and impairments:  Abnormal gait, Decreased activity tolerance, Decreased balance, Decreased knowledge of precautions, Decreased endurance, Decreased coordination, Decreased knowledge of use of DME, Decreased mobility, Decreased range of motion, Difficulty walking, Decreased safety awareness, Decreased strength, Impaired flexibility, Impaired perceived functional ability, Impaired tone, Postural dysfunction, Improper body mechanics, Pain  Visit Diagnosis: Muscle weakness (generalized)  Other lack of  coordination  Hemiplegia and hemiparesis following cerebral infarction affecting right dominant side ([place])  Other abnormalities of gait and mobility     Problem List Patient Active Problem List   Diagnosis Date Noted  . Acute CVA (cerebrovascular accident) ([place]) 05/02/2016  . Left-sided weakness 05/01/2016   Janna Arch, PT, DPT   11/11/2017, 3:15 PM  [place] [facility] MAIN [facility] SERVICES [address] [place], [place], [address] Phone: [phone]   Fax:  [phone]  Name: Curtis Powell MRN: [id number] Date of Birth: 1966/04/08

## 2017-11-11 NOTE — Therapy (Signed)
[place] MAIN [facility] SERVICES [address] [place], [place], [address] Phone: [phone]   Fax:  [phone]  Occupational Therapy Treatment  Patient Details  Name: Curtis Powell MRN: [id number] Date of Birth: 04/17/1965 Referring Provider: Dr. Larinda Buttery   Encounter Date: 11/11/2017  OT End of Session - 11/11/17 1400    Visit Number  68    Number of Visits  109    Date for OT Re-Evaluation  01/14/18    Authorization Type  Visit 8 of 10 for progress reporting period starting 10/07/2017    OT Start Time  1345    OT Stop Time  1430    OT Time Calculation (min)  45 min    Activity Tolerance  Patient tolerated treatment well    Behavior During Therapy  [facility] for tasks assessed/performed       Past Medical History:  Diagnosis Date  . GERD (gastroesophageal reflux disease)   . Hyperlipidemia   . Hypertension   . Obesity   . Stroke [facility])     Past Surgical History:  Procedure Laterality Date  . BRAIN SURGERY      There were no vitals filed for this visit.  Subjective Assessment - 11/11/17 1357    Subjective   Pt reports he is moving to [facility] soon.  He asked who his primary care doctor was (Dr Curtis Powell) and his phone number to have it at home in case he needs it.    Patient is accompained by:  -- [place] for transportation    Pertinent History  Pt. is a 52 y.o. male who suffered a CVA on 05/01/2016. Pt. was admitted to the hospital. Once discharged, he received Home Health PT and OT services for about a month. Pt. has had multiple CVAs over the past 8 years, and has had multiple falls in the past 6 months. Pt. resides in an apartment. Pt. has caregivers for 80 hours. Pt.'s mother stays with pt. at night, and assists with IADL tasks.      Patient Stated Goals  To be able to throw a ball and dribble a ball.    Currently in Pain?  No/denies      Therapeutic exercise:     Red theraband exercises in sitting 3  sets of 10 for elbow flexion and extension, reaching pectoralis muscle exercise, including looping theraband over foot on floor with reaching forward. Pt did well and needed less assist after demonstration to loop under foot.       Patient seen for UE strengthening with 2# dowel stick for shoulder flexion, chest press, ABD/ADD, diagonal patterns for 10 repetitions for 3 sets. Cues for correct and desired movement pattern.   Self care skills:    Discussed alternative way to tie shoes to avoid leaning forward to the floor to prevent falls.  Pt unable to lift leg up or cross leg over due to obesity.Pt able to tie his own shoes now.                      OT Education - 11/11/17 1400    Education provided  Yes    Education Details  UE strengthening    Person(s) Educated  Patient    Methods  Explanation;Demonstration    Comprehension  Verbalized understanding;Returned demonstration;Verbal cues required;Need further instruction       OT Short Term Goals - 03/03/17 1459      OT SHORT  TERM GOAL #1   Title  `        OT Long Term Goals - 10/22/17 5573      OT LONG TERM GOAL #1   Title  Pt. will UE functioning to be able to perfrom toilet hygiene with minimal assistance.    Baseline  Pt. is unable. 05/07/2017: Pt. continues to occassionally have difficulty.    Time  12    Period  Weeks    Status  On-going      OT LONG TERM GOAL #2   Title  Pt. will complete self grooming with minA    Baseline  Pt. continues to require extensive assist. Pt. is improving indedependent with oral care with set-up, however requires extensive assist for self-grooming tasls.    Time  12    Period  Weeks    Status  On-going      OT LONG TERM GOAL #3   Title  Pt. will perform self-feeding skills with modified indepndence    Baseline   Pt. is able to sccop, and spear the food off of the plate, however requires assist for the hand to mouth pattern. Pt. no longer needs to use built-up foam handle  for utensils.    Time  12    Period  Weeks    Status  On-going      OT LONG TERM GOAL #4   Title  Pt. will perform self dressing with minA and A/E as needed.    Baseline  Pt. continues to require extensive assist: Pt. continues to have difficulty perfroming UE,a nd LE dressing.    Time  12    Period  Weeks    Status  On-going      OT LONG TERM GOAL #5   Title  Pt. will perform light home making tasks with minA    Baseline  Pt. continues to have difficulty with folding laundry, washing dishes while standing at the sinkside. Pt. is able to make a sandwich, and heat things up in th microwave.    Time  12    Period  Weeks    Status  On-going      OT LONG TERM GOAL #6   Title  Pt. will write his name efficiently with 100% legibility    Baseline   60% legibility for name.    Time  12    Period  Weeks    Status  On-going            Plan - 11/11/17 1401    Clinical Impression Statement  Pt reported that his aide is going back to school next Monday the 12th and does not know if there is a new aide starting yet. He will be moving to [facility] area soon but does not know the date. He continues to work on using both hands for ADLs and increase strength in shoulders as well as [facility] skills for imporved function and independence.      Occupational performance deficits (Please refer to evaluation for details):  ADL's;IADL's    Rehab Potential  Fair    OT Frequency  2x / week    OT Duration  12 weeks    OT Treatment/Interventions  Self-care/ADL training;Patient/family education;DME and/or AE instruction;Therapeutic exercise;Moist Heat;Neuromuscular education    Clinical Decision Making  Several treatment options, min-mod task modification necessary    Consulted and Agree with Plan of Care  Patient       Patient will benefit from skilled therapeutic  intervention in order to improve the following deficits and impairments:  Pain, Decreased cognition, Decreased activity tolerance, Impaired UE  functional use, Decreased range of motion, Decreased coordination, Decreased endurance  Visit Diagnosis: Muscle weakness (generalized)  Other lack of coordination    Problem List Patient Active Problem List   Diagnosis Date Noted  . Acute CVA (cerebrovascular accident) ([place]) 05/02/2016  . Left-sided weakness 05/01/2016    Chrys Racer, OTR/L ascom [phone] 11/11/17, 2:31 PM  [place] MAIN [facility] SERVICES [address] [place], [place], [address] Phone: [phone]   Fax:  [phone]  Name: MOE GRACA MRN: [id number] Date of Birth: 02/21/1966

## 2017-11-13 ENCOUNTER — Ambulatory Visit: Payer: Medicare HMO | Admitting: Physical Therapy

## 2017-11-13 ENCOUNTER — Ambulatory Visit: Payer: Medicare HMO

## 2017-11-18 ENCOUNTER — Encounter: Payer: Self-pay | Admitting: Occupational Therapy

## 2017-11-18 ENCOUNTER — Ambulatory Visit: Payer: Medicare HMO | Admitting: Occupational Therapy

## 2017-11-18 ENCOUNTER — Ambulatory Visit: Payer: Medicare HMO | Admitting: Physical Therapy

## 2017-11-18 ENCOUNTER — Encounter: Payer: Self-pay | Admitting: Physical Therapy

## 2017-11-18 DIAGNOSIS — R278 Other lack of coordination: Secondary | ICD-10-CM

## 2017-11-18 DIAGNOSIS — I69351 Hemiplegia and hemiparesis following cerebral infarction affecting right dominant side: Secondary | ICD-10-CM

## 2017-11-18 DIAGNOSIS — M6281 Muscle weakness (generalized): Secondary | ICD-10-CM

## 2017-11-18 DIAGNOSIS — R2689 Other abnormalities of gait and mobility: Secondary | ICD-10-CM

## 2017-11-18 NOTE — Therapy (Signed)
[place] MAIN [facility] SERVICES [address] [place], [place], [address] Phone: [phone]   Fax:  [phone]  Occupational Therapy Treatment  Patient Details  Name: Curtis Powell MRN: [id number] Date of Birth: Jul 31, 1965 Referring Provider: Dr. Larinda Buttery   Encounter Date: 11/18/2017  OT End of Session - 11/18/17 1343    Visit Number  67    Number of Visits  39    Date for OT Re-Evaluation  01/14/18    Authorization Type  Visit 9 of 10 for progress reporting period starting 10/07/2017    OT Start Time  1335    OT Stop Time  1415    OT Time Calculation (min)  40 min    Activity Tolerance  Patient tolerated treatment well    Behavior During Therapy  [facility] for tasks assessed/performed       Past Medical History:  Diagnosis Date  . GERD (gastroesophageal reflux disease)   . Hyperlipidemia   . Hypertension   . Obesity   . Stroke [facility])     Past Surgical History:  Procedure Laterality Date  . BRAIN SURGERY      There were no vitals filed for this visit.  Subjective Assessment - 11/18/17 1341    Subjective   Pt. reports that he is feeling well.    Pertinent History  Pt. is a 52 y.o. male who suffered a CVA on 05/01/2016. Pt. was admitted to the hospital. Once discharged, he received Home Health PT and OT services for about a month. Pt. has had multiple CVAs over the past 8 years, and has had multiple falls in the past 6 months. Pt. resides in an apartment. Pt. has caregivers for 80 hours. Pt.'s mother stays with pt. at night, and assists with IADL tasks.      Patient Stated Goals  To be able to throw a ball and dribble a ball.    Currently in Pain?  No/denies      OT TREATMENT    Neuro muscular re-education:  Pt. worked on fine motor coordination skills grasping, flipping and stacking 2" large pegs on the Instructo board placed at a tabletop surface. Pt. worked on reaching activities with his RUE to place the pegs into a  bucket. Pt. Worked on manipulating, and untying various sized knots on various sizes, and textures of rope. Pt. Had more difficulty with the smaller, flatter rope.                        OT Education - 11/18/17 1345    Education provided  Yes    Education Details  UE strengthening    Person(s) Educated  Patient    Methods  Explanation;Demonstration    Comprehension  Verbalized understanding;Returned demonstration;Verbal cues required;Need further instruction       OT Short Term Goals - 03/03/17 1459      OT SHORT TERM GOAL #1   Title  `        OT Long Term Goals - 10/22/17 7824      OT LONG TERM GOAL #1   Title  Pt. will UE functioning to be able to perfrom toilet hygiene with minimal assistance.    Baseline  Pt. is unable. 05/07/2017: Pt. continues to occassionally have difficulty.    Time  12    Period  Weeks    Status  On-going      OT LONG TERM GOAL #2   Title  Pt. will complete self grooming with minA    Baseline  Pt. continues to require extensive assist. Pt. is improving indedependent with oral care with set-up, however requires extensive assist for self-grooming tasls.    Time  12    Period  Weeks    Status  On-going      OT LONG TERM GOAL #3   Title  Pt. will perform self-feeding skills with modified indepndence    Baseline   Pt. is able to sccop, and spear the food off of the plate, however requires assist for the hand to mouth pattern. Pt. no longer needs to use built-up foam handle for utensils.    Time  12    Period  Weeks    Status  On-going      OT LONG TERM GOAL #4   Title  Pt. will perform self dressing with minA and A/E as needed.    Baseline  Pt. continues to require extensive assist: Pt. continues to have difficulty perfroming UE,a nd LE dressing.    Time  12    Period  Weeks    Status  On-going      OT LONG TERM GOAL #5   Title  Pt. will perform light home making tasks with minA    Baseline  Pt. continues to have difficulty  with folding laundry, washing dishes while standing at the sinkside. Pt. is able to make a sandwich, and heat things up in th microwave.    Time  12    Period  Weeks    Status  On-going      OT LONG TERM GOAL #6   Title  Pt. will write his name efficiently with 100% legibility    Baseline   60% legibility for name.    Time  12    Period  Weeks    Status  On-going            Plan - 11/18/17 1344    Clinical Impression Statement  Pt. reports that he and his mother have found an apartment to stay in which is fully accessible. Pt. reports that he plans to move in a couple of weeks. Pt. continues to work on improving UE strength, and [facility] skills for improved functional use during ADLs, and IADLs.    Occupational performance deficits (Please refer to evaluation for details):  ADL's;IADL's    Rehab Potential  Fair    OT Frequency  2x / week    OT Duration  12 weeks    OT Treatment/Interventions  Self-care/ADL training;Patient/family education;DME and/or AE instruction;Therapeutic exercise;Moist Heat;Neuromuscular education    Clinical Decision Making  Several treatment options, min-mod task modification necessary    Consulted and Agree with Plan of Care  Patient       Patient will benefit from skilled therapeutic intervention in order to improve the following deficits and impairments:  Pain, Decreased cognition, Decreased activity tolerance, Impaired UE functional use, Decreased range of motion, Decreased coordination, Decreased endurance  Visit Diagnosis: Muscle weakness (generalized)  Other lack of coordination    Problem List Patient Active Problem List   Diagnosis Date Noted  . Acute CVA (cerebrovascular accident) ([place]) 05/02/2016  . Left-sided weakness 05/01/2016    Harrel Carina, MS, OTR/L 11/18/2017, 1:58 PM  [place] MAIN [facility] SERVICES [address] [place], [place], [address] Phone: [phone]   Fax:   [phone]  Name: Curtis Powell MRN: [id number] Date of Birth: 09-May-1965

## 2017-11-18 NOTE — Therapy (Signed)
[place] MA[facility] SERVICES [address] [place], [place], [address] Phone: [phone]   Fax:  [phone]  Physical Therapy Treatment  Patient Details  Name: Curtis Powell MRN: [id number] Date of Birth: 12/03/1965 Referring Provider: Dr. Larinda Buttery   Encounter Date: 11/18/2017  PT End of Session - 11/18/17 1449    Visit Number  60    Number of Visits  19    Date for PT Re-Evaluation  12/16/17    Authorization Type  7/10    Authorization Time Period  Start of reporting period 7/16    PT Start Time  0230    PT Stop Time  0310    PT Time Calculation (min)  40 min    Equipment Utilized During Treatment  Gait belt    Activity Tolerance  Patient tolerated treatment well;No increased pain    Behavior During Therapy  WFL for tasks assessed/performed       Past Medical History:  Diagnosis Date  . GERD (gastroesophageal reflux disease)   . Hyperlipidemia   . Hypertension   . Obesity   . Stroke [facility])     Past Surgical History:  Procedure Laterality Date  . BRAIN SURGERY      There were no vitals filed for this visit.  Subjective Assessment - 11/18/17 1448    Subjective  Patient reports no pain and stiffness. Patient reports compliance with HEP    Pertinent History   Patient is a pleasant 52 year old male who presents to physical therapy for weakness and immobility secondary to CVA.  Had a stroke in Feb 2018. He was previously fully independent, but this stroke caused severe residual deficits, mainly on the right side as well as speech, and now he is unable to walk or perform most of his ADLs on his own. Entire right side is very weak. He still has a little difficulty with speech but his swallowing is improved to baseline. His mom had to move in with him and now is his main caregiver.     Limitations  Sitting;Lifting;Standing;Walking;House hold activities    How long can you sit comfortably?  5 minutes    How long can you  stand comfortably?  5 minutes    How long can you walk comfortably?  10 ft    Patient Stated Goals  Walk without walker, walk further with walker, get some strength in back     Currently in Pain?  No/denies    Pain Score  0-No pain    Pain Onset  Yesterday      Treatment: Sit to stand transfer from:  wc to RW,  RW to nu-step,  nu-step to Rw,  sit to stand in parallel bars  with min assist and vc for correct hand placement and correct sequencing.   Standing in parallel bars :  Hip abd x 10 x 2 Hip ext x 10 x 2 Heel raises x 20  Marching x 10 x 2   Sitting ; 2 lbs LAQ with 2 sec hold x 10 reps x 2 sets 2 lbs hip flex with 2 sec hold x 10 reps x 2 sets  Gait training with RW  75 feet and wc following with CGA on level surface on hard surface and carpet with slow gait speed and decreased step length.   Patient needs cues for correct technique and form.  PT Education - 11/18/17 1449    Education provided  Yes    Education Details  HEP    Person(s) Educated  Patient    Methods  Explanation    Comprehension  Verbalized understanding       PT Short Term Goals - 11/04/17 1442      PT SHORT TERM GOAL #1   Title  Patient will be independent in home exercise program to improve strength/mobility for better functional independence with ADLs.    Baseline  hep compliant    Time  2    Period  Weeks    Status  Achieved      PT SHORT TERM GOAL #2   Title  Patient will require min cueing for STS transfer with CGA for increased independence with mobility.     Baseline  CGA    Time  2    Period  Weeks    Status  Achieved      PT SHORT TERM GOAL #3   Title  Patient will maintain upright posture for > 15 seconds to demonstrate strengthened postural control muscualture     Baseline  18 seconds     Time  2    Period  Weeks    Status  Achieved      PT SHORT TERM GOAL #4   Title  Patient will perform         PT Long Term Goals -  11/04/17 1441      PT LONG TERM GOAL #1   Title  Patient will perform bed mobility with Supervision to increase independent mobility    Baseline  able to perform mod I for increased time , occasional Min A for scooting up in bed; 7/16: min guard for safety with increased effort and time, cues to scoot back in bed before sit>supine    Time  8    Period  Weeks    Status  Partially Met      PT LONG TERM GOAL #2   Title  Patient (< 58 years old) will complete five times sit to stand test in < 10 seconds indicating an increased LE strength and improved balance.    Baseline  2/18: 30 seconds from plinth chair height single UE on seat and single on walker ; 32 seconds from plinth with single UE on seat and single on walker. 4/10: 26 seconds from [facility] with one hand on walker5/29: 22.2 seconds with one hand on walker one hand on WC; 7/16: one hand on walker one hand on WC 18.96 seconds; pt achieving 75% of full upright standing    Time  8    Period  Weeks    Status  Partially Met      PT LONG TERM GOAL #3   Title  Patient will ambulate 40 ft with walker with no breaks to allow for increased mobility within home.     Baseline  12/26: 29 ft with walker and WC follow with no rest breaks; 2/18: multiple trials previous session of >100 ft     Time  8    Period  Weeks    Status  Achieved      PT LONG TERM GOAL #4   Title  Patient will increase BLE gross strength to 4+/5 as to improve functional strength for independent gait, increased standing tolerance and increased ADL ability.    Baseline   4-/5 RLE,     Time  8    Period  Weeks  Status  Partially Met      PT LONG TERM GOAL #5   Title  Patient will increase lower extremity functional scale to >40/80 to demonstrate improved functional mobility and increased tolerance with ADLs.     Baseline  14/80; 12/26: 21/80; 2/18: 28/80  4/10; 25/80 5/29: 27/80; 7/16: 29/80    Time  8    Period  Weeks    Status  Partially Met      PT LONG TERM GOAL #6    Title  Patient will walk through a door involving one small step up/down to increase functional independence.     Baseline  require CGA and min Verbal cueing with BUE support in // bars at this time    Time  8    Period  Weeks    Status  Partially Met      PT LONG TERM GOAL #7   Title  Patient will ambulate 40 ft with least assistive device and Supervision with no breaks to allow for increased mobility within home.     Baseline  requires use of walker     Time  8    Period  Weeks    Status  Partially Met      PT LONG TERM GOAL #8   Title  Patient will perform 10 MWT in < 1 minute to demonstrate improved ambulatory velocity and capacity.     Baseline  4/10: 2 minutes 45 seconds; 5/29: 2 minute 31 seconds; 3 minutes 22 seconds    Time  8    Period  Weeks    Status  On-going            Plan - 11/18/17 1451    Clinical Impression Statement  Patient presents with decreased gait speed, decreased balance, and decreased BLE strength. Patient's main and complaint is BLE weakness and inability to participate in desired activities. Patient wants to improve his balance and strength  and ability to ambulate on indoor surfaces safely. Patient will benefit from skilled PT in order to increase gait speed, increase BLE strength, and improve dynamic standing balance to decrease risk for falls and enable patient to participate in desired activities.    Clinical Impairments Affecting Rehab Potential  hx of HTN, HLD, CVA, learning disability, Diabetes, brain tumor,     PT Frequency  2x / week    PT Duration  8 weeks    PT Treatment/Interventions  ADLs/Self Care Home Management;Aquatic Therapy;Ultrasound;Moist Heat;Traction;DME Instruction;Gait training;Stair training;Functional mobility training;Therapeutic activities;Therapeutic exercise;Orthotic Fit/Training;Neuromuscular re-education;Balance training;Patient/family education;Manual techniques;Wheelchair mobility training;Passive range of motion;Energy  conservation;Taping;Visual/perceptual remediation/compensation    PT Next Visit Plan  floor recovery; pt requests to review HEP and to have handout provided     PT Home Exercise Plan  TrA contraction     Consulted and Agree with Plan of Care  Patient       Patient will benefit from skilled therapeutic intervention in order to improve the following deficits and impairments:  Abnormal gait, Decreased activity tolerance, Decreased balance, Decreased knowledge of precautions, Decreased endurance, Decreased coordination, Decreased knowledge of use of DME, Decreased mobility, Decreased range of motion, Difficulty walking, Decreased safety awareness, Decreased strength, Impaired flexibility, Impaired perceived functional ability, Impaired tone, Postural dysfunction, Improper body mechanics, Pain  Visit Diagnosis: Muscle weakness (generalized)  Other lack of coordination  Hemiplegia and hemiparesis following cerebral infarction affecting right dominant side (HCC)  Other abnormalities of gait and mobility     Problem List Patient Active Problem List  Diagnosis Date Noted  . Acute CVA (cerebrovascular accident) ([place]) 05/02/2016  . Left-sided weakness 05/01/2016    Alanson Puls, [place] DPT 11/18/2017, 2:52 PM  [place] MAIN [facility] SERVICES [address] [place], [place], [address] Phone: [phone]   Fax:  [phone]  Name: Curtis Powell MRN: [id number] Date of Birth: [birth year]

## 2017-11-20 ENCOUNTER — Ambulatory Visit: Payer: Medicare HMO | Admitting: Occupational Therapy

## 2017-11-20 ENCOUNTER — Encounter: Payer: Self-pay | Admitting: Occupational Therapy

## 2017-11-20 ENCOUNTER — Ambulatory Visit: Payer: Medicare HMO

## 2017-11-20 DIAGNOSIS — R278 Other lack of coordination: Secondary | ICD-10-CM

## 2017-11-20 DIAGNOSIS — I69351 Hemiplegia and hemiparesis following cerebral infarction affecting right dominant side: Secondary | ICD-10-CM

## 2017-11-20 DIAGNOSIS — M6281 Muscle weakness (generalized): Secondary | ICD-10-CM | POA: Diagnosis not present

## 2017-11-20 DIAGNOSIS — R2689 Other abnormalities of gait and mobility: Secondary | ICD-10-CM

## 2017-11-20 NOTE — Therapy (Signed)
[place] MAIN [facility] SERVICES [address] [place], [place], [address] Phone: [phone]   Fax:  [phone]  Occupational Therapy Progress Note  Dates of reporting period  10/07/2017   to   11/20/2017  Patient Details  Name: Curtis Powell MRN: [id number] Date of Birth: [birth year] Referring Provider: Dr. Larinda Buttery   Encounter Date: 11/20/2017  OT End of Session - 11/20/17 1529    Visit Number  44    Number of Visits  45    Date for OT Re-Evaluation  01/14/18    Authorization Type  Visit 10 of 10 for progress reporting period starting 10/07/2017    OT Start Time  1515    OT Stop Time  1600    OT Time Calculation (min)  45 min    Activity Tolerance  Patient tolerated treatment well    Behavior During Therapy  [facility] for tasks assessed/performed       Past Medical History:  Diagnosis Date  . GERD (gastroesophageal reflux disease)   . Hyperlipidemia   . Hypertension   . Obesity   . Stroke [facility])     Past Surgical History:  Procedure Laterality Date  . BRAIN SURGERY      There were no vitals filed for this visit.  Subjective Assessment - 11/20/17 1519    Subjective   Pt. reports that he has to be out of his current apartment by the 15th of September.    Pertinent History  Pt. is a 52 y.o. male who suffered a CVA on 05/01/2016. Pt. was admitted to the hospital. Once discharged, he received Home Health PT and OT services for about a month. Pt. has had multiple CVAs over the past 8 years, and has had multiple falls in the past 6 months. Pt. resides in an apartment. Pt. has caregivers for 80 hours. Pt.'s mother stays with pt. at night, and assists with IADL tasks.      Patient Stated Goals  To be able to throw a ball and dribble a ball.    Currently in Pain?  No/denies       OT TREATMENT    Therapeutic Exercise:  Pt. performed gross gripping with grip strengthener. Pt. worked on sustaining grip while grasping pegs and  reaching at various heights. The gripper was placed in the resistive slot with the white resistive spring. Pt. worked on pinch strengthening in the right hand for lateral, and 3pt. pinch using yellow, red, green, and blue resistive clips. Pt. worked on placing the clips at various vertical and horizontal angles. Tactile and verbal cues were required for eliciting the desired movement.   Self-care:   Pt. worked on Media planner. Pt. was able to maintain a mature grasp on the pen during while writing. Pt. Is improving with writing legibly for printing, and presented with decreased legibility cursive written form.                       OT Education - 11/20/17 1528    Education provided  Yes    Education Details  UE strengthening, and coordination    Person(s) Educated  Patient    Methods  Explanation;Demonstration    Comprehension  Verbalized understanding;Returned demonstration;Verbal cues required;Need further instruction       OT Short Term Goals - 03/03/17 1459      OT SHORT TERM GOAL #1   Title  `        OT  Long Term Goals - 11/20/17 1844      OT LONG TERM GOAL #1   Title  Pt. will UE functioning to be able to perfrom toilet hygiene with minimal assistance.    Baseline  Pt. conintues to require assist    Time  12    Period  Weeks    Status  On-going    Target Date  01/14/18      OT LONG TERM GOAL #2   Title  Pt. will complete self grooming with minA    Baseline  Pt. is improving indedependent with oral care with set-up, however requires extensive assist for self-grooming tasls.    Time  12    Period  Weeks    Status  On-going    Target Date  01/14/18      OT LONG TERM GOAL #3   Title  Pt. will perform self-feeding skills with modified indepndence    Baseline   Pt. is able to sccop, and spear the food off of the plate, however requires assist for the hand to mouth pattern. Pt. no longer needs to use built-up foam handle for utensils.    Time   12    Period  Weeks    Status  On-going    Target Date  01/14/18      OT LONG TERM GOAL #4   Title  Pt. will perform self dressing with minA and A/E as needed.    Baseline  Pt. continues to require extensive assist: Pt. continues to have difficulty perfroming UE,a nd LE dressing.    Time  12    Period  Weeks    Status  On-going    Target Date  01/14/18      OT LONG TERM GOAL #5   Title  Pt. will perform light home making tasks with minA    Baseline  Pt. continues to have difficulty with folding laundry, washing dishes while standing at the sinkside. Pt. is able to make a sandwich, and heat things up in th microwave.    Time  12    Period  Weeks    Status  On-going    Target Date  01/14/18      OT LONG TERM GOAL #6   Title  Pt. will write his name efficiently with 100% legibility    Baseline   60% legibility for name.    Time  12    Period  Weeks    Status  On-going    Target Date  01/14/18            Plan - 11/20/17 1529    Clinical Impression Statement  Pt. has a new home health aide, and is getting acclimated to the new caregiver. Pt. reports that there is alot of turn over with the home health agenecy . Pt. is in the process of packing to move into a new apartment. Pt. continues to present with limted RUE strength, and fine motor coordination skills, and continues to work on improving RUE functioning during writing, and ADL/IADL tasks.    Occupational performance deficits (Please refer to evaluation for details):  ADL's;IADL's    Rehab Potential  Fair    OT Frequency  2x / week    OT Duration  12 weeks    OT Treatment/Interventions  Self-care/ADL training;Patient/family education;DME and/or AE instruction;Therapeutic exercise;Moist Heat;Neuromuscular education    Clinical Decision Making  Several treatment options, min-mod task modification necessary    Consulted and Agree with Plan  of Care  Patient       Patient will benefit from skilled therapeutic intervention in  order to improve the following deficits and impairments:  Pain, Decreased cognition, Decreased activity tolerance, Impaired UE functional use, Decreased range of motion, Decreased coordination, Decreased endurance  Visit Diagnosis: Muscle weakness (generalized)  Other lack of coordination    Problem List Patient Active Problem List   Diagnosis Date Noted  . Acute CVA (cerebrovascular accident) ([place]) 05/02/2016  . Left-sided weakness 05/01/2016    Harrel Carina, MS, OTR/L 11/20/2017, 6:55 PM  [place] MAIN [facility] SERVICES [address] [place], [place], [address] Phone: [phone]   Fax:  [phone]  Name: GRACE HAGGART MRN: [id number] Date of Birth: 01/11/1966

## 2017-11-20 NOTE — Therapy (Signed)
[place] MAIN [facility] SERVICES [address] [place], [place], [address] Phone: [phone]   Fax:  [phone]  Physical Therapy Treatment  Patient Details  Name: Curtis Powell MRN: [id number] Date of Birth: 01/04/1966 Referring Provider: Dr. Larinda Buttery   Encounter Date: 11/20/2017  PT End of Session - 11/20/17 1437    Visit Number  62    Number of Visits  49    Date for PT Re-Evaluation  12/16/17    Authorization Type  8/10    Authorization Time Period  Start of reporting period 7/16    PT Start Time  1429    PT Stop Time  1515    PT Time Calculation (min)  46 min    Equipment Utilized During Treatment  Gait belt    Activity Tolerance  Patient tolerated treatment well;No increased pain    Behavior During Therapy  WFL for tasks assessed/performed       Past Medical History:  Diagnosis Date  . GERD (gastroesophageal reflux disease)   . Hyperlipidemia   . Hypertension   . Obesity   . Stroke [facility])     Past Surgical History:  Procedure Laterality Date  . BRAIN SURGERY      There were no vitals filed for this visit.  Subjective Assessment - 11/20/17 1434    Subjective  Patient reports that they are moving next week to their new handicap apartment. Patient reports that they got a new caregiver today. Reports trying to walk everyday.     Pertinent History   Patient is a pleasant 52 year old male who presents to physical therapy for weakness and immobility secondary to CVA.  Had a stroke in Feb 2018. He was previously fully independent, but this stroke caused severe residual deficits, mainly on the right side as well as speech, and now he is unable to walk or perform most of his ADLs on his own. Entire right side is very weak. He still has a little difficulty with speech but his swallowing is improved to baseline. His mom had to move in with him and now is his main caregiver.     Limitations   Sitting;Lifting;Standing;Walking;House hold activities    How long can you sit comfortably?  5 minutes    How long can you stand comfortably?  5 minutes    How long can you walk comfortably?  10 ft    Patient Stated Goals  Walk without walker, walk further with walker, get some strength in back     Currently in Pain?  No/denies      Stand pivot transfer with RW and CGA from WC to Nustep  Nustep Lvl 3 3 minutes RPM >60 for cardiovascular challenge   Bathroom mobility : Min A- CGA for transfer, maintaining static standing position during urination and cleansing of hands.    Step over and back half foam roller: 10x each leg ; cues for lifting knee for clearance forwards and backwards.      Standing side steps in // bars with CGA 10x each leg; 2 sets no rest break inbetween.    Standing marches 10x each leg with BUE support   Standing hip extension 10x each leg, BUE support    Static balance 3x30 seconds in // bars with no UE support.; no LOB   Standing lunge stretch 2x30 seconds BLE Min A to low back/pelvis for forward push.       TrA activation seated 10x 3  second holds    Seated pf 10x each leg GTB          Pt. response to medical necessity:  Patient will continue to benefit from skilled physical therapy to improve strength balance and gait safety                    PT Education - 11/20/17 1436    Education provided  Yes    Education Details  exercise technique     Person(s) Educated  Patient    Methods  Explanation;Demonstration;Verbal cues    Comprehension  Verbalized understanding;Returned demonstration       PT Short Term Goals - 11/04/17 1442      PT SHORT TERM GOAL #1   Title  Patient will be independent in home exercise program to improve strength/mobility for better functional independence with ADLs.    Baseline  hep compliant    Time  2    Period  Weeks    Status  Achieved      PT SHORT TERM GOAL #2   Title  Patient will require min  cueing for STS transfer with CGA for increased independence with mobility.     Baseline  CGA    Time  2    Period  Weeks    Status  Achieved      PT SHORT TERM GOAL #3   Title  Patient will maintain upright posture for > 15 seconds to demonstrate strengthened postural control muscualture     Baseline  18 seconds     Time  2    Period  Weeks    Status  Achieved      PT SHORT TERM GOAL #4   Title  Patient will perform         PT Long Term Goals - 11/04/17 1441      PT LONG TERM GOAL #1   Title  Patient will perform bed mobility with Supervision to increase independent mobility    Baseline  able to perform mod I for increased time , occasional Min A for scooting up in bed; 7/16: min guard for safety with increased effort and time, cues to scoot back in bed before sit>supine    Time  8    Period  Weeks    Status  Partially Met      PT LONG TERM GOAL #2   Title  Patient (< 86 years old) will complete five times sit to stand test in < 10 seconds indicating an increased LE strength and improved balance.    Baseline  2/18: 30 seconds from plinth chair height single UE on seat and single on walker ; 32 seconds from plinth with single UE on seat and single on walker. 4/10: 26 seconds from [facility] with one hand on walker5/29: 22.2 seconds with one hand on walker one hand on WC; 7/16: one hand on walker one hand on WC 18.96 seconds; pt achieving 75% of full upright standing    Time  8    Period  Weeks    Status  Partially Met      PT LONG TERM GOAL #3   Title  Patient will ambulate 40 ft with walker with no breaks to allow for increased mobility within home.     Baseline  12/26: 29 ft with walker and WC follow with no rest breaks; 2/18: multiple trials previous session of >100 ft     Time  8    Period  Weeks    Status  Achieved      PT LONG TERM GOAL #4   Title  Patient will increase BLE gross strength to 4+/5 as to improve functional strength for independent gait, increased standing  tolerance and increased ADL ability.    Baseline   4-/5 RLE,     Time  8    Period  Weeks    Status  Partially Met      PT LONG TERM GOAL #5   Title  Patient will increase lower extremity functional scale to >40/80 to demonstrate improved functional mobility and increased tolerance with ADLs.     Baseline  14/80; 12/26: 21/80; 2/18: 28/80  4/10; 25/80 5/29: 27/80; 7/16: 29/80    Time  8    Period  Weeks    Status  Partially Met      PT LONG TERM GOAL #6   Title  Patient will walk through a door involving one small step up/down to increase functional independence.     Baseline  require CGA and min Verbal cueing with BUE support in // bars at this time    Time  8    Period  Weeks    Status  Partially Met      PT LONG TERM GOAL #7   Title  Patient will ambulate 40 ft with least assistive device and Supervision with no breaks to allow for increased mobility within home.     Baseline  requires use of walker     Time  8    Period  Weeks    Status  Partially Met      PT LONG TERM GOAL #8   Title  Patient will perform 10 MWT in < 1 minute to demonstrate improved ambulatory velocity and capacity.     Baseline  4/10: 2 minutes 45 seconds; 5/29: 2 minute 31 seconds; 3 minutes 22 seconds    Time  8    Period  Weeks    Status  On-going            Plan - 11/20/17 1502    Clinical Impression Statement  Patient continues to be challenged with forward trunk posture and limited step length with ambulation however when performing lunge in // bars increased upright posture was achieved with larger distance between feet.  Decreased episodes of R knee buckling demonstrates improved strengthening of RLE. Patient will continue to benefit from skilled physical therapy to improve strength balance and gait safety    Clinical Impairments Affecting Rehab Potential  hx of HTN, HLD, CVA, learning disability, Diabetes, brain tumor,     PT Frequency  2x / week    PT Duration  8 weeks    PT  Treatment/Interventions  ADLs/Self Care Home Management;Aquatic Therapy;Ultrasound;Moist Heat;Traction;DME Instruction;Gait training;Stair training;Functional mobility training;Therapeutic activities;Therapeutic exercise;Orthotic Fit/Training;Neuromuscular re-education;Balance training;Patient/family education;Manual techniques;Wheelchair mobility training;Passive range of motion;Energy conservation;Taping;Visual/perceptual remediation/compensation    PT Next Visit Plan  floor recovery; pt requests to review HEP and to have handout provided     PT Home Exercise Plan  TrA contraction     Consulted and Agree with Plan of Care  Patient       Patient will benefit from skilled therapeutic intervention in order to improve the following deficits and impairments:  Abnormal gait, Decreased activity tolerance, Decreased balance, Decreased knowledge of precautions, Decreased endurance, Decreased coordination, Decreased knowledge of use of DME, Decreased mobility, Decreased range of motion, Difficulty walking, Decreased safety awareness, Decreased strength, Impaired flexibility, Impaired  perceived functional ability, Impaired tone, Postural dysfunction, Improper body mechanics, Pain  Visit Diagnosis: Muscle weakness (generalized)  Other lack of coordination  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 Acute CVA (cerebrovascular accident) ([place]) 05/02/2016  . Left-sided weakness 05/01/2016   Janna Arch, PT, DPT   11/20/2017, 3:16 PM  [place] MAIN [facility] SERVICES [address] [place], [place], [address] Phone: [phone]   Fax:  [phone]  Name: RONAK DUQUETTE MRN: [id number] Date of Birth: 1966-03-24

## 2017-11-25 ENCOUNTER — Ambulatory Visit: Payer: Medicare HMO | Admitting: Occupational Therapy

## 2017-11-25 ENCOUNTER — Ambulatory Visit: Payer: Medicare HMO

## 2017-11-25 DIAGNOSIS — R278 Other lack of coordination: Secondary | ICD-10-CM

## 2017-11-25 DIAGNOSIS — I69351 Hemiplegia and hemiparesis following cerebral infarction affecting right dominant side: Secondary | ICD-10-CM

## 2017-11-25 DIAGNOSIS — R2689 Other abnormalities of gait and mobility: Secondary | ICD-10-CM

## 2017-11-25 DIAGNOSIS — M6281 Muscle weakness (generalized): Secondary | ICD-10-CM | POA: Diagnosis not present

## 2017-11-25 NOTE — Therapy (Signed)
[place] MAIN [facility] SERVICES [address] [place], [place], [address] Phone: [phone]   Fax:  [phone]  Physical Therapy Treatment  Patient Details  Name: Curtis Powell MRN: [id number] Date of Birth: 1965/11/15 Referring Provider: Dr. Larinda Buttery   Encounter Date: 11/25/2017  PT End of Session - 11/25/17 1308    Visit Number  61    Number of Visits  77    Date for PT Re-Evaluation  12/16/17    Authorization Type  9/10    Authorization Time Period  Start of reporting period 7/16    PT Start Time  1300    PT Stop Time  1345    PT Time Calculation (min)  45 min    Equipment Utilized During Treatment  Gait belt    Activity Tolerance  Patient tolerated treatment well;No increased pain    Behavior During Therapy  WFL for tasks assessed/performed       Past Medical History:  Diagnosis Date  . GERD (gastroesophageal reflux disease)   . Hyperlipidemia   . Hypertension   . Obesity   . Stroke [facility])     Past Surgical History:  Procedure Laterality Date  . BRAIN SURGERY      There were no vitals filed for this visit.  Subjective Assessment - 11/25/17 1306    Subjective  Patient reports caregivers are not comfortable walking him outside so his walking between sessions is limited. Reports compliance with HEP.     Pertinent History   Patient is a pleasant 52 year old male who presents to physical therapy for weakness and immobility secondary to CVA.  Had a stroke in Feb 2018. He was previously fully independent, but this stroke caused severe residual deficits, mainly on the right side as well as speech, and now he is unable to walk or perform most of his ADLs on his own. Entire right side is very weak. He still has a little difficulty with speech but his swallowing is improved to baseline. His mom had to move in with him and now is his main caregiver.     Limitations  Sitting;Lifting;Standing;Walking;House hold activities     How long can you sit comfortably?  5 minutes    How long can you stand comfortably?  5 minutes    How long can you walk comfortably?  10 ft    Patient Stated Goals  Walk without walker, walk further with walker, get some strength in back     Currently in Pain?  No/denies       Stand pivot transfer with RW and CGA from WC to Nustep   Nustep Lvl 4 3 minutes RPM >60 for cardiovascular challenge    Ambulate in hallway with RW and CGA 50 ft with WC follow, cues for planting right foot and control swing phase of LLE to reduce buckling of R knee. Frequent cueing for upright posture and foot clearance  Bathroom mobility : Min A- CGA for transfer, maintaining static standing position during urination and cleansing of hands.   Standing in // bars balloon taps with SUE support 10x each arm. No LOB     Standing side step length of bars 2x; going to left easier than going to the right    Standing lunge stretch 2x30 seconds BLE Min A to low back/pelvis for forward push.       TrA activation seated 10x 3 second holds     Seated gluteal squeezes 10x 3  second          Pt. response to medical necessity:  Patient will continue to benefit from skilled physical therapy to improve strength balance and gait safety                         PT Education - 11/25/17 1307    Education provided  Yes    Education Details  exercise technique     Person(s) Educated  Patient    Methods  Explanation;Demonstration;Verbal cues    Comprehension  Verbalized understanding;Returned demonstration       PT Short Term Goals - 11/04/17 1442      PT SHORT TERM GOAL #1   Title  Patient will be independent in home exercise program to improve strength/mobility for better functional independence with ADLs.    Baseline  hep compliant    Time  2    Period  Weeks    Status  Achieved      PT SHORT TERM GOAL #2   Title  Patient will require min cueing for STS transfer with CGA for increased  independence with mobility.     Baseline  CGA    Time  2    Period  Weeks    Status  Achieved      PT SHORT TERM GOAL #3   Title  Patient will maintain upright posture for > 15 seconds to demonstrate strengthened postural control muscualture     Baseline  18 seconds     Time  2    Period  Weeks    Status  Achieved      PT SHORT TERM GOAL #4   Title  Patient will perform         PT Long Term Goals - 11/04/17 1441      PT LONG TERM GOAL #1   Title  Patient will perform bed mobility with Supervision to increase independent mobility    Baseline  able to perform mod I for increased time , occasional Min A for scooting up in bed; 7/16: min guard for safety with increased effort and time, cues to scoot back in bed before sit>supine    Time  8    Period  Weeks    Status  Partially Met      PT LONG TERM GOAL #2   Title  Patient (< 46 years old) will complete five times sit to stand test in < 10 seconds indicating an increased LE strength and improved balance.    Baseline  2/18: 30 seconds from plinth chair height single UE on seat and single on walker ; 32 seconds from plinth with single UE on seat and single on walker. 4/10: 26 seconds from [facility] with one hand on walker5/29: 22.2 seconds with one hand on walker one hand on WC; 7/16: one hand on walker one hand on WC 18.96 seconds; pt achieving 75% of full upright standing    Time  8    Period  Weeks    Status  Partially Met      PT LONG TERM GOAL #3   Title  Patient will ambulate 40 ft with walker with no breaks to allow for increased mobility within home.     Baseline  12/26: 29 ft with walker and WC follow with no rest breaks; 2/18: multiple trials previous session of >100 ft     Time  8    Period  Weeks    Status  Achieved      PT LONG TERM GOAL #4   Title  Patient will increase BLE gross strength to 4+/5 as to improve functional strength for independent gait, increased standing tolerance and increased ADL ability.    Baseline    4-/5 RLE,     Time  8    Period  Weeks    Status  Partially Met      PT LONG TERM GOAL #5   Title  Patient will increase lower extremity functional scale to >40/80 to demonstrate improved functional mobility and increased tolerance with ADLs.     Baseline  14/80; 12/26: 21/80; 2/18: 28/80  4/10; 25/80 5/29: 27/80; 7/16: 29/80    Time  8    Period  Weeks    Status  Partially Met      PT LONG TERM GOAL #6   Title  Patient will walk through a door involving one small step up/down to increase functional independence.     Baseline  require CGA and min Verbal cueing with BUE support in // bars at this time    Time  8    Period  Weeks    Status  Partially Met      PT LONG TERM GOAL #7   Title  Patient will ambulate 40 ft with least assistive device and Supervision with no breaks to allow for increased mobility within home.     Baseline  requires use of walker     Time  8    Period  Weeks    Status  Partially Met      PT LONG TERM GOAL #8   Title  Patient will perform 10 MWT in < 1 minute to demonstrate improved ambulatory velocity and capacity.     Baseline  4/10: 2 minutes 45 seconds; 5/29: 2 minute 31 seconds; 3 minutes 22 seconds    Time  8    Period  Weeks    Status  On-going            Plan - 11/25/17 1331    Clinical Impression Statement  Patient demonstrated improved hip flexion with ambulation to clear feet and reduce episodes of scuffing feet. Patient demonstrates improved ability to reach for objects inside and outside BOS in standing with SUE support demonstrating improved stability.  Patient will continue to benefit from skilled physical therapy to improve strength balance and gait safety    Clinical Impairments Affecting Rehab Potential  hx of HTN, HLD, CVA, learning disability, Diabetes, brain tumor,     PT Frequency  2x / week    PT Duration  8 weeks    PT Treatment/Interventions  ADLs/Self Care Home Management;Aquatic Therapy;Ultrasound;Moist Heat;Traction;DME  Instruction;Gait training;Stair training;Functional mobility training;Therapeutic activities;Therapeutic exercise;Orthotic Fit/Training;Neuromuscular re-education;Balance training;Patient/family education;Manual techniques;Wheelchair mobility training;Passive range of motion;Energy conservation;Taping;Visual/perceptual remediation/compensation    PT Next Visit Plan  floor recovery; pt requests to review HEP and to have handout provided     PT Home Exercise Plan  TrA contraction     Consulted and Agree with Plan of Care  Patient       Patient will benefit from skilled therapeutic intervention in order to improve the following deficits and impairments:  Abnormal gait, Decreased activity tolerance, Decreased balance, Decreased knowledge of precautions, Decreased endurance, Decreased coordination, Decreased knowledge of use of DME, Decreased mobility, Decreased range of motion, Difficulty walking, Decreased safety awareness, Decreased strength, Impaired flexibility, Impaired perceived functional ability, Impaired tone, Postural dysfunction, Improper body mechanics, Pain  Visit  Diagnosis: Muscle weakness (generalized)  Other lack of coordination  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 Acute CVA (cerebrovascular accident) ([place]) 05/02/2016  . Left-sided weakness 05/01/2016   Janna Arch, PT, DPT   11/25/2017, 1:45 PM  [place] MAIN [facility] SERVICES [address] [place], [place], [address] Phone: [phone]   Fax:  [phone]  Name: Curtis Powell MRN: [id number] Date of Birth: Jan 29, 1966

## 2017-11-27 ENCOUNTER — Ambulatory Visit: Payer: Medicare HMO

## 2017-11-27 DIAGNOSIS — R278 Other lack of coordination: Secondary | ICD-10-CM

## 2017-11-27 DIAGNOSIS — M6281 Muscle weakness (generalized): Secondary | ICD-10-CM | POA: Diagnosis not present

## 2017-11-27 NOTE — Therapy (Signed)
[place] MAIN [facility] SERVICES [address] [place], [place], [address] Phone: [phone]   Fax:  [phone]  Physical Therapy Treatment Physical Therapy Progress Note   Dates of reporting period  10/21/17  to   11/27/17  Patient Details  Name: Curtis Powell MRN: [id number] Date of Birth: 03/15/1966 Referring Provider: Dr. Larinda Buttery   Encounter Date: 11/27/2017  PT End of Session - 11/27/17 1356    Visit Number  94    Number of Visits  75    Date for PT Re-Evaluation  12/16/17    Authorization Type  10/10 ( next 1/10 start 8/22)     PT Start Time  1259    PT Stop Time  1346    PT Time Calculation (min)  47 min    Equipment Utilized During Treatment  Gait belt    Activity Tolerance  Patient tolerated treatment well;No increased pain    Behavior During Therapy  WFL for tasks assessed/performed       Past Medical History:  Diagnosis Date  . GERD (gastroesophageal reflux disease)   . Hyperlipidemia   . Hypertension   . Obesity   . Stroke [facility])     Past Surgical History:  Procedure Laterality Date  . BRAIN SURGERY      There were no vitals filed for this visit.  Subjective Assessment - 11/27/17 1305    Subjective  Patient reports compliance with HEP. Patient reports walking. Patient reports no pain or falls.     Pertinent History   Patient is a pleasant 52 year old male who presents to physical therapy for weakness and immobility secondary to CVA.  Had a stroke in Feb 2018. He was previously fully independent, but this stroke caused severe residual deficits, mainly on the right side as well as speech, and now he is unable to walk or perform most of his ADLs on his own. Entire right side is very weak. He still has a little difficulty with speech but his swallowing is improved to baseline. His mom had to move in with him and now is his main caregiver.     Limitations  Sitting;Lifting;Standing;Walking;House hold  activities    How long can you sit comfortably?  5 minutes    How long can you stand comfortably?  5 minutes    How long can you walk comfortably?  10 ft    Patient Stated Goals  Walk without walker, walk further with walker, get some strength in back     Currently in Pain?  No/denies       5xSTS: 18 seconds pt achieving 75% of full upright standing Bed mobility BLE strength  LEFS: 30/80  Ambulate 40 ft  10 MWT : 3 min 2 seconds  GTB seated abduction 15x GTB hamstring curls; 15x each leg    Bathroom mobility; patient had an incidence of incontinence resulting in need to change pampers, shorts and pad on chair resulting in prolonged episodes of standing and single UE stability.    Patient's condition has the potential to improve in response to therapy. Maximum improvement is yet to be obtained. The anticipated improvement is attainable and reasonable in a generally predictable time. Start date of reporting period 10/21/17 end date of reporting period 11/27/17. Patient reports he is feeling more mobile in his house and more steady upon standing position.  PT Education - 11/27/17 1305    Education provided  Yes    Education Details  exercise technique     Person(s) Educated  Patient    Methods  Explanation;Demonstration;Verbal cues    Comprehension  Verbalized understanding;Returned demonstration       PT Short Term Goals - 11/04/17 1442      PT SHORT TERM GOAL #1   Title  Patient will be independent in home exercise program to improve strength/mobility for better functional independence with ADLs.    Baseline  hep compliant    Time  2    Period  Weeks    Status  Achieved      PT SHORT TERM GOAL #2   Title  Patient will require min cueing for STS transfer with CGA for increased independence with mobility.     Baseline  CGA    Time  2    Period  Weeks    Status  Achieved      PT SHORT TERM GOAL #3   Title  Patient will maintain  upright posture for > 15 seconds to demonstrate strengthened postural control muscualture     Baseline  18 seconds     Time  2    Period  Weeks    Status  Achieved      PT SHORT TERM GOAL #4   Title  Patient will perform         PT Long Term Goals - 11/27/17 1308      PT LONG TERM GOAL #1   Title  Patient will perform bed mobility with Supervision to increase independent mobility    Baseline  able to perform mod I for increased time , occasional Min A for scooting up in bed; 7/16: min guard for safety with increased effort and time, cues to scoot back in bed before sit>supine    Time  8    Period  Weeks    Status  Partially Met      PT LONG TERM GOAL #2   Title  Patient (< 30 years old) will complete five times sit to stand test in < 10 seconds indicating an increased LE strength and improved balance.    Baseline  2/18: 30 seconds from plinth chair height single UE on seat and single on walker ; 32 seconds from plinth with single UE on seat and single on walker. 4/10: 26 seconds from [facility] with one hand on walker5/29: 22.2 seconds with one hand on walker one hand on WC; 7/16: one hand on walker one hand on WC 18.96 seconds; pt achieving 75% of full upright standing 8/22:one hand on walker one hand on WC 18 seconds; pt achieving 75% of full upright standing     Time  8    Period  Weeks    Status  Partially Met      PT LONG TERM GOAL #3   Title  Patient will ambulate 40 ft with walker with no breaks to allow for increased mobility within home.     Baseline  12/26: 29 ft with walker and WC follow with no rest breaks; 2/18: multiple trials previous session of >100 ft     Time  8    Period  Weeks    Status  Achieved      PT LONG TERM GOAL #4   Title  Patient will increase BLE gross strength to 4+/5 as to improve functional strength for independent gait, increased standing tolerance and increased ADL  ability.    Baseline   4-/5 RLE,     Time  8    Period  Weeks    Status  Partially Met       PT LONG TERM GOAL #5   Title  Patient will increase lower extremity functional scale to >40/80 to demonstrate improved functional mobility and increased tolerance with ADLs.     Baseline  14/80; 12/26: 21/80; 2/18: 28/80  4/10; 25/80 5/29: 27/80; 7/16: 29/80 8/22: 30/80     Time  8    Period  Weeks    Status  Partially Met      PT LONG TERM GOAL #6   Title  Patient will walk through a door involving one small step up/down to increase functional independence.     Baseline  require CGA and min Verbal cueing with BUE support in // bars at this time    Time  8    Period  Weeks    Status  Partially Met      PT LONG TERM GOAL #7   Title  Patient will ambulate 40 ft with least assistive device and Supervision with no breaks to allow for increased mobility within home.     Baseline  requires use of walker     Time  8    Period  Weeks    Status  Partially Met      PT LONG TERM GOAL #8   Title  Patient will perform 10 MWT in < 1 minute to demonstrate improved ambulatory velocity and capacity.     Baseline  4/10: 2 minutes 45 seconds; 5/29: 2 minute 31 seconds; 3 minutes 22 seconds 8/22: 3 minutes 2 seconds     Time  8    Period  Weeks    Status  Partially Met            Plan - 11/27/17 1359    Clinical Impression Statement  Despite starting session with prolonged standing sessions to change/clean patient after incontinence episode patient demonstrated slight increases in goal progression with decreased time needed for 10 MWT; and 5xSTS , and improved LEFS. Patient's condition has the potential to improve in response to therapy. Maximum improvement is yet to be obtained. The anticipated improvement is attainable and reasonable in a generally predictable time. Patient will continue to benefit from skilled physical therapy to improve strength balance and gait safety    Clinical Impairments Affecting Rehab Potential  hx of HTN, HLD, CVA, learning disability, Diabetes, brain tumor,      PT Frequency  2x / week    PT Duration  8 weeks    PT Treatment/Interventions  ADLs/Self Care Home Management;Aquatic Therapy;Ultrasound;Moist Heat;Traction;DME Instruction;Gait training;Stair training;Functional mobility training;Therapeutic activities;Therapeutic exercise;Orthotic Fit/Training;Neuromuscular re-education;Balance training;Patient/family education;Manual techniques;Wheelchair mobility training;Passive range of motion;Energy conservation;Taping;Visual/perceptual remediation/compensation    PT Next Visit Plan  floor recovery; pt requests to review HEP and to have handout provided     PT Home Exercise Plan  TrA contraction     Consulted and Agree with Plan of Care  Patient       Patient will benefit from skilled therapeutic intervention in order to improve the following deficits and impairments:  Abnormal gait, Decreased activity tolerance, Decreased balance, Decreased knowledge of precautions, Decreased endurance, Decreased coordination, Decreased knowledge of use of DME, Decreased mobility, Decreased range of motion, Difficulty walking, Decreased safety awareness, Decreased strength, Impaired flexibility, Impaired perceived functional ability, Impaired tone, Postural dysfunction, Improper body mechanics, Pain  Visit Diagnosis:  Muscle weakness (generalized)  Other lack of coordination     Problem List Patient Active Problem List   Diagnosis Date Noted  . Acute CVA (cerebrovascular accident) ([place]) 05/02/2016  . Left-sided weakness 05/01/2016   Janna Arch, PT, DPT   11/27/2017, 2:01 PM  [place] MAIN [facility] SERVICES [address] [place], [place], [address] Phone: [phone]   Fax:  [phone]  Name: Curtis Powell MRN: [id number] Date of Birth: 17-May-1965

## 2017-12-01 ENCOUNTER — Encounter: Payer: Self-pay | Admitting: Occupational Therapy

## 2017-12-01 NOTE — Therapy (Signed)
[place] MAIN [facility] SERVICES [address] [place], [place], [address] Phone: [phone]   Fax:  [phone]  Occupational Therapy Treatment  Patient Details  Name: Curtis Powell MRN: [id number] Date of Birth: [birth year] Referring Provider: Dr. Larinda Buttery   Encounter Date: 11/25/2017  OT End of Session - 12/01/17 1622    Visit Number  12    Number of Visits  69    Date for OT Re-Evaluation  01/14/18    Authorization Type  Visit 1 of 10 for progress reporting period starting 10/07/2017    OT Start Time  1402    OT Stop Time  1445    OT Time Calculation (min)  43 min    Activity Tolerance  Patient tolerated treatment well    Behavior During Therapy  [facility] for tasks assessed/performed       Past Medical History:  Diagnosis Date  . GERD (gastroesophageal reflux disease)   . Hyperlipidemia   . Hypertension   . Obesity   . Stroke [facility])     Past Surgical History:  Procedure Laterality Date  . BRAIN SURGERY      There were no vitals filed for this visit.  Subjective Assessment - 12/01/17 1621    Subjective   Moving sometime soon to another apartment that is more handicap accessible, about 15 mins from where is lives now.  Feels his shoulder is getting better    Pertinent History  Pt. is a 52 y.o. male who suffered a CVA on 05/01/2016. Pt. was admitted to the hospital. Once discharged, he received Home Health PT and OT services for about a month. Pt. has had multiple CVAs over the past 8 years, and has had multiple falls in the past 6 months. Pt. resides in an apartment. Pt. has caregivers for 80 hours. Pt.'s mother stays with pt. at night, and assists with IADL tasks.      Patient Stated Goals  To be able to throw a ball and dribble a ball.    Currently in Pain?  No/denies    Pain Score  0-No pain    Multiple Pain Sites  No          Manipulation of Magnet board for reach, some difficulty with right UE reach, increased  effort.  Left hand separating magnets  with one hand only and placing onto board, unable to perform with right hand, has to use both hands together to separate  magnets, removing with right hand. Unable to remove large magnets from board with right hand but able to place.   Cutting with loop scissors with right hand while holding paper with left hand.  Grip strength with 3rd setting 17.9# for 25 reps, 23.4# for 25 reps, cues for grip on tool.     Min assist for toilet transfer             OT Education - 12/01/17 1622    Education provided  Yes    Education Details  UE strengthening, and coordination    Person(s) Educated  Patient    Methods  Explanation;Demonstration    Comprehension  Verbalized understanding;Returned demonstration;Verbal cues required;Need further instruction       OT Short Term Goals - 03/03/17 1459      OT SHORT TERM GOAL #1   Title  `        OT Long Term Goals - 11/20/17 1844      OT LONG TERM GOAL #1  Title  Pt. will UE functioning to be able to perfrom toilet hygiene with minimal assistance.    Baseline  Pt. conintues to require assist    Time  12    Period  Weeks    Status  On-going    Target Date  01/14/18      OT LONG TERM GOAL #2   Title  Pt. will complete self grooming with minA    Baseline  Pt. is improving indedependent with oral care with set-up, however requires extensive assist for self-grooming tasls.    Time  12    Period  Weeks    Status  On-going    Target Date  01/14/18      OT LONG TERM GOAL #3   Title  Pt. will perform self-feeding skills with modified indepndence    Baseline   Pt. is able to sccop, and spear the food off of the plate, however requires assist for the hand to mouth pattern. Pt. no longer needs to use built-up foam handle for utensils.    Time  12    Period  Weeks    Status  On-going    Target Date  01/14/18      OT LONG TERM GOAL #4   Title  Pt. will perform self dressing with minA and A/E as  needed.    Baseline  Pt. continues to require extensive assist: Pt. continues to have difficulty perfroming UE,a nd LE dressing.    Time  12    Period  Weeks    Status  On-going    Target Date  01/14/18      OT LONG TERM GOAL #5   Title  Pt. will perform light home making tasks with minA    Baseline  Pt. continues to have difficulty with folding laundry, washing dishes while standing at the sinkside. Pt. is able to make a sandwich, and heat things up in th microwave.    Time  12    Period  Weeks    Status  On-going    Target Date  01/14/18      OT LONG TERM GOAL #6   Title  Pt. will write his name efficiently with 100% legibility    Baseline   60% legibility for name.    Time  12    Period  Weeks    Status  On-going    Target Date  01/14/18            Plan - 12/01/17 1623    Clinical Impression Statement  Patient has difficulty with right UE reaching and separating magnets in right hand with isolated finger movements, requires both right and left hand to complete this task.  He was able to hold loop scissors and cut with right hand with verbal cues.  He continues to work towards improving strength and coordination of right hand primarily to improve independence in daily tasks. Continues to require the use of verbal and tactile cues to perform exercises.     Occupational performance deficits (Please refer to evaluation for details):  ADL's;IADL's    Rehab Potential  Fair    OT Frequency  2x / week    OT Duration  12 weeks    OT Treatment/Interventions  Self-care/ADL training;Patient/family education;DME and/or AE instruction;Therapeutic exercise;Moist Heat;Neuromuscular education    Consulted and Agree with Plan of Care  Patient       Patient will benefit from skilled therapeutic intervention in order to improve the following deficits  and impairments:  Pain, Decreased cognition, Decreased activity tolerance, Impaired UE functional use, Decreased range of motion, Decreased  coordination, Decreased endurance  Visit Diagnosis: Muscle weakness (generalized)  Other lack of coordination  Hemiplegia and hemiparesis following cerebral infarction affecting right dominant side [facility])    Problem List Patient Active Problem List   Diagnosis Date Noted  . Acute CVA (cerebrovascular accident) ([place]) 05/02/2016  . Left-sided weakness 05/01/2016   Achilles Dunk, OTR/L, CLT  Shiya Fogelman 12/01/2017, 9:46 PM  [place] MAIN [facility] SERVICES [address] [place], [place], [address] Phone: [phone]   Fax:  [phone]  Name: Curtis Powell MRN: [id number] Date of Birth: 1965-05-19

## 2017-12-02 ENCOUNTER — Encounter: Payer: Self-pay | Admitting: Physical Therapy

## 2017-12-02 ENCOUNTER — Ambulatory Visit: Payer: Medicare HMO | Admitting: Physical Therapy

## 2017-12-02 ENCOUNTER — Encounter: Payer: Self-pay | Admitting: Occupational Therapy

## 2017-12-02 ENCOUNTER — Ambulatory Visit: Payer: Medicare HMO | Admitting: Occupational Therapy

## 2017-12-02 DIAGNOSIS — R2689 Other abnormalities of gait and mobility: Secondary | ICD-10-CM

## 2017-12-02 DIAGNOSIS — R278 Other lack of coordination: Secondary | ICD-10-CM

## 2017-12-02 DIAGNOSIS — M6281 Muscle weakness (generalized): Secondary | ICD-10-CM | POA: Diagnosis not present

## 2017-12-02 DIAGNOSIS — I69351 Hemiplegia and hemiparesis following cerebral infarction affecting right dominant side: Secondary | ICD-10-CM

## 2017-12-02 NOTE — Therapy (Signed)
[place] MAIN [facility] SERVICES [address] [place], [place], [address] Phone: [phone]   Fax:  [phone]  Physical Therapy Treatment  Patient Details  Name: Curtis Powell MRN: [id number] Date of Birth: Jul 08, 1965 Referring Provider: Dr. Larinda Buttery   Encounter Date: 12/02/2017  PT End of Session - 12/02/17 1413    Visit Number  18    Number of Visits  75    Date for PT Re-Evaluation  12/16/17    Authorization Type  1/10 ( next 1/10 start 8/22)     PT Start Time  0150    PT Stop Time  0230    PT Time Calculation (min)  40 min    Equipment Utilized During Treatment  Gait belt    Activity Tolerance  Patient tolerated treatment well;No increased pain    Behavior During Therapy  WFL for tasks assessed/performed       Past Medical History:  Diagnosis Date  . GERD (gastroesophageal reflux disease)   . Hyperlipidemia   . Hypertension   . Obesity   . Stroke [facility])     Past Surgical History:  Procedure Laterality Date  . BRAIN SURGERY      There were no vitals filed for this visit.  Subjective Assessment - 12/02/17 1412    Subjective  Patient reports compliance with HEP. Patient reports walking. Patient reports no pain or falls.     Pertinent History   Patient is a pleasant 52 year old male who presents to physical therapy for weakness and immobility secondary to CVA.  Had a stroke in Feb 2018. He was previously fully independent, but this stroke caused severe residual deficits, mainly on the right side as well as speech, and now he is unable to walk or perform most of his ADLs on his own. Entire right side is very weak. He still has a little difficulty with speech but his swallowing is improved to baseline. His mom had to move in with him and now is his main caregiver.     Limitations  Sitting;Lifting;Standing;Walking;House hold activities    How long can you sit comfortably?  5 minutes    How long can you stand  comfortably?  5 minutes    How long can you walk comfortably?  10 ft    Patient Stated Goals  Walk without walker, walk further with walker, get some strength in back     Currently in Pain?  No/denies    Pain Score  0-No pain    Pain Onset  Yesterday    Multiple Pain Sites  No       Treatment:   Transfer training wc to stand  with RW and CGA from WC x 5   Nustep Lvl 4 3 minutes RPM >60 for cardiovascular challenge  Ambulate in hallway with RW 100  feet and CGA  with WC follow, cues for planting right foot and control swing phase of LLE to reduce buckling of R knee. Frequent cueing for upright posture and foot clearance  Standing side stepping left and right  length of bars 2x; VC for posture correction  Standing lunge stretch 2x30 seconds BLEMin A to low back/pelvis for forward push.  Seated LAQ BLE x 10   Pt. response to medical necessity: Patient will continue to benefit from skilled physical therapy to improve strength balance and gait safety  PT Education - 12/02/17 1412    Education provided  Yes    Education Details  HEP    Person(s) Educated  Patient    Methods  Explanation;Verbal cues    Comprehension  Verbalized understanding;Returned demonstration;Verbal cues required       PT Short Term Goals - 11/04/17 1442      PT SHORT TERM GOAL #1   Title  Patient will be independent in home exercise program to improve strength/mobility for better functional independence with ADLs.    Baseline  hep compliant    Time  2    Period  Weeks    Status  Achieved      PT SHORT TERM GOAL #2   Title  Patient will require min cueing for STS transfer with CGA for increased independence with mobility.     Baseline  CGA    Time  2    Period  Weeks    Status  Achieved      PT SHORT TERM GOAL #3   Title  Patient will maintain upright posture for > 15 seconds to demonstrate strengthened postural control muscualture      Baseline  18 seconds     Time  2    Period  Weeks    Status  Achieved      PT SHORT TERM GOAL #4   Title  Patient will perform         PT Long Term Goals - 11/27/17 1308      PT LONG TERM GOAL #1   Title  Patient will perform bed mobility with Supervision to increase independent mobility    Baseline  able to perform mod I for increased time , occasional Min A for scooting up in bed; 7/16: min guard for safety with increased effort and time, cues to scoot back in bed before sit>supine    Time  8    Period  Weeks    Status  Partially Met      PT LONG TERM GOAL #2   Title  Patient (< 70 years old) will complete five times sit to stand test in < 10 seconds indicating an increased LE strength and improved balance.    Baseline  2/18: 30 seconds from plinth chair height single UE on seat and single on walker ; 32 seconds from plinth with single UE on seat and single on walker. 4/10: 26 seconds from [facility] with one hand on walker5/29: 22.2 seconds with one hand on walker one hand on WC; 7/16: one hand on walker one hand on WC 18.96 seconds; pt achieving 75% of full upright standing 8/22:one hand on walker one hand on WC 18 seconds; pt achieving 75% of full upright standing     Time  8    Period  Weeks    Status  Partially Met      PT LONG TERM GOAL #3   Title  Patient will ambulate 40 ft with walker with no breaks to allow for increased mobility within home.     Baseline  12/26: 29 ft with walker and WC follow with no rest breaks; 2/18: multiple trials previous session of >100 ft     Time  8    Period  Weeks    Status  Achieved      PT LONG TERM GOAL #4   Title  Patient will increase BLE gross strength to 4+/5 as to improve functional strength for independent gait, increased standing tolerance and increased ADL  ability.    Baseline   4-/5 RLE,     Time  8    Period  Weeks    Status  Partially Met      PT LONG TERM GOAL #5   Title  Patient will increase lower extremity functional scale  to >40/80 to demonstrate improved functional mobility and increased tolerance with ADLs.     Baseline  14/80; 12/26: 21/80; 2/18: 28/80  4/10; 25/80 5/29: 27/80; 7/16: 29/80 8/22: 30/80     Time  8    Period  Weeks    Status  Partially Met      PT LONG TERM GOAL #6   Title  Patient will walk through a door involving one small step up/down to increase functional independence.     Baseline  require CGA and min Verbal cueing with BUE support in // bars at this time    Time  8    Period  Weeks    Status  Partially Met      PT LONG TERM GOAL #7   Title  Patient will ambulate 40 ft with least assistive device and Supervision with no breaks to allow for increased mobility within home.     Baseline  requires use of walker     Time  8    Period  Weeks    Status  Partially Met      PT LONG TERM GOAL #8   Title  Patient will perform 10 MWT in < 1 minute to demonstrate improved ambulatory velocity and capacity.     Baseline  4/10: 2 minutes 45 seconds; 5/29: 2 minute 31 seconds; 3 minutes 22 seconds 8/22: 3 minutes 2 seconds     Time  8    Period  Weeks    Status  Partially Met            Plan - 12/02/17 1421    Clinical Impression Statement  Patient demonstrated decreased episodes of R knee buckling throughout session demonstrating increased R quad strengthening. Patient fatigued with standing posture exercises resulting in seated rest break between interventions. Patient will continue to benefit from skilled physical therapy to improve strength balance and gait safety    Clinical Impairments Affecting Rehab Potential  hx of HTN, HLD, CVA, learning disability, Diabetes, brain tumor,     PT Frequency  2x / week    PT Duration  8 weeks    PT Treatment/Interventions  ADLs/Self Care Home Management;Aquatic Therapy;Ultrasound;Moist Heat;Traction;DME Instruction;Gait training;Stair training;Functional mobility training;Therapeutic activities;Therapeutic exercise;Orthotic  Fit/Training;Neuromuscular re-education;Balance training;Patient/family education;Manual techniques;Wheelchair mobility training;Passive range of motion;Energy conservation;Taping;Visual/perceptual remediation/compensation    PT Next Visit Plan  floor recovery; pt requests to review HEP and to have handout provided     PT Home Exercise Plan  TrA contraction     Consulted and Agree with Plan of Care  Patient       Patient will benefit from skilled therapeutic intervention in order to improve the following deficits and impairments:  Abnormal gait, Decreased activity tolerance, Decreased balance, Decreased knowledge of precautions, Decreased endurance, Decreased coordination, Decreased knowledge of use of DME, Decreased mobility, Decreased range of motion, Difficulty walking, Decreased safety awareness, Decreased strength, Impaired flexibility, Impaired perceived functional ability, Impaired tone, Postural dysfunction, Improper body mechanics, Pain  Visit Diagnosis: Muscle weakness (generalized)  Other lack of coordination  Hemiplegia and hemiparesis following cerebral infarction affecting right dominant side (HCC)  Other abnormalities of gait and mobility     Problem List Patient Active  Problem List   Diagnosis Date Noted  . Acute CVA (cerebrovascular accident) ([place]) 05/02/2016  . Left-sided weakness 05/01/2016    Alanson Puls, [place] DPT 12/02/2017, 2:25 PM  [place] MAIN [facility] SERVICES [address] [place], [place], [address] Phone: [phone]   Fax:  [phone]  Name: Curtis Powell MRN: [id number] Date of Birth: 1965/06/28

## 2017-12-02 NOTE — Therapy (Signed)
[place] MAIN [facility] SERVICES [address] [place], [place], [address] Phone: [phone]   Fax:  [phone]  Occupational Therapy Treatment  Patient Details  Name: Curtis Powell MRN: [id number] Date of Birth: 1966-03-18 Referring Provider: Dr. Larinda Buttery   Encounter Date: 12/02/2017  OT End of Session - 12/02/17 1308    Visit Number  50    Number of Visits  69    Date for OT Re-Evaluation  01/14/18    Authorization Type  Visit 2 of 10 for progress reporting period starting 10/07/2017    OT Start Time  1300    OT Stop Time  1345    OT Time Calculation (min)  45 min    Activity Tolerance  Patient tolerated treatment well    Behavior During Therapy  [facility] for tasks assessed/performed       Past Medical History:  Diagnosis Date  . GERD (gastroesophageal reflux disease)   . Hyperlipidemia   . Hypertension   . Obesity   . Stroke [facility])     Past Surgical History:  Procedure Laterality Date  . BRAIN SURGERY      There were no vitals filed for this visit.  Subjective Assessment - 12/02/17 1306    Subjective   Pt. reports that he is moving suppposed to be out of his apartment by Sept. 15th.    Pertinent History  Pt. is a 52 y.o. male who suffered a CVA on 05/01/2016. Pt. was admitted to the hospital. Once discharged, he received Home Health PT and OT services for about a month. Pt. has had multiple CVAs over the past 8 years, and has had multiple falls in the past 6 months. Pt. resides in an apartment. Pt. has caregivers for 80 hours. Pt.'s mother stays with pt. at night, and assists with IADL tasks.      Patient Stated Goals  To be able to throw a ball and dribble a ball.    Currently in Pain?  No/denies      OT TREATMENT    Neuro muscular re-education:  Pt. performed right, and left [facility] skills training to improve speed and dexterity needed for ADL tasks and writing. Pt. demonstrated grasping 1 inch sticks,  inch  cylindrical collars, and  inch flat washers on the [employer]urdue pegboard. Pt. has difficulty attempting to store the objects in his hand. Pt. worked on removing the washers, and sticks while alternating thumb opposition to the tip of his 2nd through 5th digits. Pt. has difficulty with thumb opposition skills on the right. Pt. Required increased time to complete. Pt. performed [facility] tasks using the grooved pegboard. Pt. worked on grasping the grooved pegs from a horizontal position, and moving the pegs to a vertical position in the hand to prepare for placing them in the grooved slot. Pt. worked on removing the grooved pegs grasping, and storing the pegs in the palm of his hand.                         OT Education - 12/02/17 1308    Education provided  Yes    Education Details  UE strengthening, and coordination    Person(s) Educated  Patient    Methods  Explanation;Demonstration    Comprehension  Verbalized understanding;Returned demonstration;Verbal cues required;Need further instruction       OT Short Term Goals - 03/03/17 1459      OT SHORT TERM GOAL #1  Title  `        OT Long Term Goals - 11/20/17 1844      OT LONG TERM GOAL #1   Title  Pt. will UE functioning to be able to perfrom toilet hygiene with minimal assistance.    Baseline  Pt. conintues to require assist    Time  12    Period  Weeks    Status  On-going    Target Date  01/14/18      OT LONG TERM GOAL #2   Title  Pt. will complete self grooming with minA    Baseline  Pt. is improving indedependent with oral care with set-up, however requires extensive assist for self-grooming tasls.    Time  12    Period  Weeks    Status  On-going    Target Date  01/14/18      OT LONG TERM GOAL #3   Title  Pt. will perform self-feeding skills with modified indepndence    Baseline   Pt. is able to sccop, and spear the food off of the plate, however requires assist for the hand to mouth pattern. Pt. no longer needs  to use built-up foam handle for utensils.    Time  12    Period  Weeks    Status  On-going    Target Date  01/14/18      OT LONG TERM GOAL #4   Title  Pt. will perform self dressing with minA and A/E as needed.    Baseline  Pt. continues to require extensive assist: Pt. continues to have difficulty perfroming UE,a nd LE dressing.    Time  12    Period  Weeks    Status  On-going    Target Date  01/14/18      OT LONG TERM GOAL #5   Title  Pt. will perform light home making tasks with minA    Baseline  Pt. continues to have difficulty with folding laundry, washing dishes while standing at the sinkside. Pt. is able to make a sandwich, and heat things up in th microwave.    Time  12    Period  Weeks    Status  On-going    Target Date  01/14/18      OT LONG TERM GOAL #6   Title  Pt. will write his name efficiently with 100% legibility    Baseline   60% legibility for name.    Time  12    Period  Weeks    Status  On-going    Target Date  01/14/18            Plan - 12/02/17 1309    Clinical Impression Statement Pt. reports that he now has a lifeline. Pt. reports that he is supposed to have a new aide starting tomorrow. Pt. reports an increase in the amount of time is aides will be caring for him to 4 hours.  Pt. continues to present with limited UE strength, hand function, and [facility] skills, and continues to work on improving these skills in order to be able to use them during ADL, and IADL tasks.   Occupational performance deficits (Please refer to evaluation for details):  ADL's;IADL's    Rehab Potential  Fair    OT Frequency  2x / week    OT Duration  12 weeks    OT Treatment/Interventions  Self-care/ADL training;Patient/family education;DME and/or AE instruction;Therapeutic exercise;Moist Heat;Neuromuscular education    Clinical Decision Making  Several treatment options, min-mod task modification necessary    Consulted and Agree with Plan of Care  Patient       Patient will  benefit from skilled therapeutic intervention in order to improve the following deficits and impairments:  Pain, Decreased cognition, Decreased activity tolerance, Impaired UE functional use, Decreased range of motion, Decreased coordination, Decreased endurance  Visit Diagnosis: Muscle weakness (generalized)  Other lack of coordination    Problem List Patient Active Problem List   Diagnosis Date Noted  . Acute CVA (cerebrovascular accident) ([place]) 05/02/2016  . Left-sided weakness 05/01/2016    Harrel Carina, MS, OTR/L 12/02/2017, 1:26 PM  [place] MAIN [facility] SERVICES [address] [place], [place], [address] Phone: [phone]   Fax:  [phone]  Name: Curtis Powell MRN: [id number] Date of Birth: 03/23/1966

## 2017-12-04 ENCOUNTER — Ambulatory Visit: Payer: Medicare HMO

## 2017-12-09 ENCOUNTER — Encounter: Payer: Self-pay | Admitting: Occupational Therapy

## 2017-12-09 ENCOUNTER — Ambulatory Visit: Payer: Medicare HMO

## 2017-12-09 ENCOUNTER — Ambulatory Visit: Payer: Medicare HMO | Attending: Family Medicine | Admitting: Occupational Therapy

## 2017-12-09 DIAGNOSIS — I69351 Hemiplegia and hemiparesis following cerebral infarction affecting right dominant side: Secondary | ICD-10-CM | POA: Insufficient documentation

## 2017-12-09 DIAGNOSIS — R2689 Other abnormalities of gait and mobility: Secondary | ICD-10-CM | POA: Diagnosis present

## 2017-12-09 DIAGNOSIS — R278 Other lack of coordination: Secondary | ICD-10-CM | POA: Diagnosis present

## 2017-12-09 DIAGNOSIS — M6281 Muscle weakness (generalized): Secondary | ICD-10-CM | POA: Diagnosis not present

## 2017-12-09 NOTE — Therapy (Signed)
[place] MAIN [facility] SERVICES [address] [place], [place], [address] Phone: [phone]   Fax:  [phone]  Physical Therapy Treatment  Patient Details  Name: Curtis Powell MRN: [id number] Date of Birth: 07-28-1965 Referring Provider: Dr. Larinda Buttery   Encounter Date: 12/09/2017  PT End of Session - 12/09/17 1439    Visit Number  65    Number of Visits  75    Date for PT Re-Evaluation  12/16/17    Authorization Type  2/10 ( PN start 8/22)     PT Start Time  1435    PT Stop Time  1516    PT Time Calculation (min)  41 min    Equipment Utilized During Treatment  Gait belt    Activity Tolerance  Patient tolerated treatment well;No increased pain    Behavior During Therapy  WFL for tasks assessed/performed       Past Medical History:  Diagnosis Date  . GERD (gastroesophageal reflux disease)   . Hyperlipidemia   . Hypertension   . Obesity   . Stroke [facility])     Past Surgical History:  Procedure Laterality Date  . BRAIN SURGERY      There were no vitals filed for this visit.  Subjective Assessment - 12/09/17 1438    Subjective  Patient reports compliance with HEP. Reports he has been standing up and walking every day. Hasn't moved into his new place yet. Has been having difficulty with new caregiver.     Pertinent History   Patient is a pleasant 52 year old male who presents to physical therapy for weakness and immobility secondary to CVA.  Had a stroke in Feb 2018. He was previously fully independent, but this stroke caused severe residual deficits, mainly on the right side as well as speech, and now he is unable to walk or perform most of his ADLs on his own. Entire right side is very weak. He still has a little difficulty with speech but his swallowing is improved to baseline. His mom had to move in with him and now is his main caregiver.     Limitations  Sitting;Lifting;Standing;Walking;House hold activities    How long  can you sit comfortably?  5 minutes    How long can you stand comfortably?  5 minutes    How long can you walk comfortably?  10 ft    Patient Stated Goals  Walk without walker, walk further with walker, get some strength in back     Currently in Pain?  No/denies         Treatment:  Transfer training wc to stand  with RW and CGA from WC x 8; cues for hand placement and weight shift to promote quadriceps activation. ; needs tactile cues for posterior pelvic rotation  Nustep Lvl 4 3 minutes RPM >60 for cardiovascular challenge  Standing marches with RW and CGA: WC behind patient; focus on posterior pelvic tilt for upright posture. Min A for weight shift over opposing LE  Standing with RW heel raises (one at a time) to promote increased weight shift CGA. WC behind patient.  2 sets      Standing side stepping left and right  length of bars 2x; VC for posture correction ; cues for keeping hips in neutral alignment.      Bathroom mobility: STS transfer, static stand with SUE support, stand and reach with SUE support. CGA at all times.     Pt. response to  medical necessity:  Patient will continue to benefit from skilled physical therapy to improve strength balance and gait safety                       PT Education - 12/09/17 1438    Education provided  Yes    Education Details  exercise technique,     Person(s) Educated  Patient    Methods  Explanation;Demonstration;Verbal cues    Comprehension  Verbalized understanding;Returned demonstration       PT Short Term Goals - 11/04/17 1442      PT SHORT TERM GOAL #1   Title  Patient will be independent in home exercise program to improve strength/mobility for better functional independence with ADLs.    Baseline  hep compliant    Time  2    Period  Weeks    Status  Achieved      PT SHORT TERM GOAL #2   Title  Patient will require min cueing for STS transfer with CGA for increased independence with mobility.      Baseline  CGA    Time  2    Period  Weeks    Status  Achieved      PT SHORT TERM GOAL #3   Title  Patient will maintain upright posture for > 15 seconds to demonstrate strengthened postural control muscualture     Baseline  18 seconds     Time  2    Period  Weeks    Status  Achieved      PT SHORT TERM GOAL #4   Title  Patient will perform         PT Long Term Goals - 11/27/17 1308      PT LONG TERM GOAL #1   Title  Patient will perform bed mobility with Supervision to increase independent mobility    Baseline  able to perform mod I for increased time , occasional Min A for scooting up in bed; 7/16: min guard for safety with increased effort and time, cues to scoot back in bed before sit>supine    Time  8    Period  Weeks    Status  Partially Met      PT LONG TERM GOAL #2   Title  Patient (< 37 years old) will complete five times sit to stand test in < 10 seconds indicating an increased LE strength and improved balance.    Baseline  2/18: 30 seconds from plinth chair height single UE on seat and single on walker ; 32 seconds from plinth with single UE on seat and single on walker. 4/10: 26 seconds from [facility] with one hand on walker5/29: 22.2 seconds with one hand on walker one hand on WC; 7/16: one hand on walker one hand on WC 18.96 seconds; pt achieving 75% of full upright standing 8/22:one hand on walker one hand on WC 18 seconds; pt achieving 75% of full upright standing     Time  8    Period  Weeks    Status  Partially Met      PT LONG TERM GOAL #3   Title  Patient will ambulate 40 ft with walker with no breaks to allow for increased mobility within home.     Baseline  12/26: 29 ft with walker and WC follow with no rest breaks; 2/18: multiple trials previous session of >100 ft     Time  8    Period  Weeks  Status  Achieved      PT LONG TERM GOAL #4   Title  Patient will increase BLE gross strength to 4+/5 as to improve functional strength for independent gait, increased  standing tolerance and increased ADL ability.    Baseline   4-/5 RLE,     Time  8    Period  Weeks    Status  Partially Met      PT LONG TERM GOAL #5   Title  Patient will increase lower extremity functional scale to >40/80 to demonstrate improved functional mobility and increased tolerance with ADLs.     Baseline  14/80; 12/26: 21/80; 2/18: 28/80  4/10; 25/80 5/29: 27/80; 7/16: 29/80 8/22: 30/80     Time  8    Period  Weeks    Status  Partially Met      PT LONG TERM GOAL #6   Title  Patient will walk through a door involving one small step up/down to increase functional independence.     Baseline  require CGA and min Verbal cueing with BUE support in // bars at this time    Time  8    Period  Weeks    Status  Partially Met      PT LONG TERM GOAL #7   Title  Patient will ambulate 40 ft with least assistive device and Supervision with no breaks to allow for increased mobility within home.     Baseline  requires use of walker     Time  8    Period  Weeks    Status  Partially Met      PT LONG TERM GOAL #8   Title  Patient will perform 10 MWT in < 1 minute to demonstrate improved ambulatory velocity and capacity.     Baseline  4/10: 2 minutes 45 seconds; 5/29: 2 minute 31 seconds; 3 minutes 22 seconds 8/22: 3 minutes 2 seconds     Time  8    Period  Weeks    Status  Partially Met            Plan - 12/09/17 1519    Clinical Impression Statement  Patient challenged with maintaining neutral hip alignment due to limited muscle tissue extensibility of hip flexors. Patient fatigued quickly in this position requiring max verbal and tactile cueing for continuation.  Patient will continue to benefit from skilled physical therapy to improve strength balance and gait safety    Clinical Impairments Affecting Rehab Potential  hx of HTN, HLD, CVA, learning disability, Diabetes, brain tumor,     PT Frequency  2x / week    PT Duration  8 weeks    PT Treatment/Interventions  ADLs/Self Care  Home Management;Aquatic Therapy;Ultrasound;Moist Heat;Traction;DME Instruction;Gait training;Stair training;Functional mobility training;Therapeutic activities;Therapeutic exercise;Orthotic Fit/Training;Neuromuscular re-education;Balance training;Patient/family education;Manual techniques;Wheelchair mobility training;Passive range of motion;Energy conservation;Taping;Visual/perceptual remediation/compensation    PT Next Visit Plan  floor recovery; pt requests to review HEP and to have handout provided     PT Home Exercise Plan  TrA contraction     Consulted and Agree with Plan of Care  Patient       Patient will benefit from skilled therapeutic intervention in order to improve the following deficits and impairments:  Abnormal gait, Decreased activity tolerance, Decreased balance, Decreased knowledge of precautions, Decreased endurance, Decreased coordination, Decreased knowledge of use of DME, Decreased mobility, Decreased range of motion, Difficulty walking, Decreased safety awareness, Decreased strength, Impaired flexibility, Impaired perceived functional ability, Impaired tone, Postural dysfunction,  Improper body mechanics, Pain  Visit Diagnosis: Muscle weakness (generalized)  Other lack of coordination  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 Acute CVA (cerebrovascular accident) ([place]) 05/02/2016  . Left-sided weakness 05/01/2016   Janna Arch, PT, DPT   12/09/2017, 3:32 PM  [place] MAIN [facility] SERVICES [address] [place], [place], [address] Phone: [phone]   Fax:  [phone]  Name: SHAHZAIB AZEVEDO MRN: [id number] Date of Birth: Oct 18, 1965

## 2017-12-09 NOTE — Therapy (Signed)
[place] MAIN [facility] SERVICES [address] [place], [place], [address] Phone: [phone]   Fax:  [phone]  Occupational Therapy Treatment  Patient Details  Name: Curtis Powell MRN: [id number] Date of Birth: Jan 31, 1966 Referring Provider: Dr. Larinda Buttery   Encounter Date: 12/09/2017  OT End of Session - 12/09/17 1353    Visit Number  51    Number of Visits  60    Date for OT Re-Evaluation  01/14/18    Authorization Type  Visit 2 of 10 for progress reporting period starting 10/07/2017    OT Start Time  1345    OT Stop Time  1430    OT Time Calculation (min)  45 min    Activity Tolerance  Patient tolerated treatment well    Behavior During Therapy  [facility] for tasks assessed/performed       Past Medical History:  Diagnosis Date  . GERD (gastroesophageal reflux disease)   . Hyperlipidemia   . Hypertension   . Obesity   . Stroke [facility])     Past Surgical History:  Procedure Laterality Date  . BRAIN SURGERY      There were no vitals filed for this visit.  Subjective Assessment - 12/09/17 1351    Subjective   Pt. reports that he is suppposed to be moving out of his apartment by Sept. 15th.    Pertinent History  Pt. is a 52 y.o. male who suffered a CVA on 05/01/2016. Pt. was admitted to the hospital. Once discharged, he received Home Health PT and OT services for about a month. Pt. has had multiple CVAs over the past 8 years, and has had multiple falls in the past 6 months. Pt. resides in an apartment. Pt. has caregivers for 80 hours. Pt.'s mother stays with pt. at night, and assists with IADL tasks.      Patient Stated Goals  To be able to throw a ball and dribble a ball.    Currently in Pain?  No/denies    Pain Score  0-No pain      OT TREATMENT    Neuro muscular re-education:  Pt. worked on bilateral [facility] skills, and prehension patterns needed to grasp and manipulate various sizes of nuts, and bolts on a vertical tower.  Pt. worked on grasping and manipulating heavy duty magnets, and reaching up to place them on a whiteboard placed vertically at the tabletop. Pt. worked on grasping, and manipulating 1/2" magnetic pegs, separating them from the bunch. Pt. worked sorting them by color in [employer]. Pt. worked on removing the pegs while alternating thumb opposition to the tip of his 2nd through 5th digits.    Therapeutic Exercise:  Pt. performed resistive EZ Board exercises for right forearm supination/pronation, wrist flexion/extension using gross grasp, and lateral pinch (key) grasp. Pt. performed resistive EZ Board exercises angled in several planes to promote shoulder flexion, abduction, and wrist flexion, and extension while performing resistive wrist flexion and extension with a gross grip.                        OT Education - 12/09/17 1352    Education provided  Yes    Education Details  strengthening, and coordination    Person(s) Educated  Patient    Methods  Explanation;Demonstration    Comprehension  Verbalized understanding;Returned demonstration;Verbal cues required;Need further instruction       OT Short Term Goals - 03/03/17 1459  OT SHORT TERM GOAL #1   Title         OT Long Term Goals - 11/20/17 1844      OT LONG TERM GOAL #1   Title  Pt. will UE functioning to be able to perfrom toilet hygiene with minimal assistance.    Baseline  Pt. conintues to require assist    Time  12    Period  Weeks    Status  On-going    Target Date  01/14/18      OT LONG TERM GOAL #2   Title  Pt. will complete self grooming with minA    Baseline  Pt. is improving indedependent with oral care with set-up, however requires extensive assist for self-grooming tasls.    Time  12    Period  Weeks    Status  On-going    Target Date  01/14/18      OT LONG TERM GOAL #3   Title  Pt. will perform self-feeding skills with modified indepndence    Baseline   Pt. is able to sccop,  and spear the food off of the plate, however requires assist for the hand to mouth pattern. Pt. no longer needs to use built-up foam handle for utensils.    Time  12    Period  Weeks    Status  On-going    Target Date  01/14/18      OT LONG TERM GOAL #4   Title  Pt. will perform self dressing with minA and A/E as needed.    Baseline  Pt. continues to require extensive assist: Pt. continues to have difficulty perfroming UE,a nd LE dressing.    Time  12    Period  Weeks    Status  On-going    Target Date  01/14/18      OT LONG TERM GOAL #5   Title  Pt. will perform light home making tasks with minA    Baseline  Pt. continues to have difficulty with folding laundry, washing dishes while standing at the sinkside. Pt. is able to make a sandwich, and heat things up in th microwave.    Time  12    Period  Weeks    Status  On-going    Target Date  01/14/18      OT LONG TERM GOAL #6   Title  Pt. will write his name efficiently with 100% legibility    Baseline   60% legibility for name.    Time  12    Period  Weeks    Status  On-going    Target Date  01/14/18            Plan - 12/09/17 1354    Clinical Impression Statement  Pt. reports that his aide did not show up today. Pt. continues to work on improving BUE strength, and [facility] skills. Pt. worked on tasks challenging coordination while UEs are sustained in elevation. Pt. continues to work on improving UE strength, and coordination skills in order to improve functional hand use during ADLs, and IADLs.    Occupational performance deficits (Please refer to evaluation for details):  ADL's;IADL's    Rehab Potential  Fair    OT Frequency  2x / week    OT Duration  12 weeks    OT Treatment/Interventions  Self-care/ADL training;Patient/family education;DME and/or AE instruction;Therapeutic exercise;Moist Heat;Neuromuscular education    Clinical Decision Making  Several treatment options, min-mod task modification necessary    Consulted and  Agree with Plan of Care  Patient       Patient will benefit from skilled therapeutic intervention in order to improve the following deficits and impairments:  Pain, Decreased cognition, Decreased activity tolerance, Impaired UE functional use, Decreased range of motion, Decreased coordination, Decreased endurance  Visit Diagnosis: Muscle weakness (generalized)  Other lack of coordination    Problem List Patient Active Problem List   Diagnosis Date Noted  . Acute CVA (cerebrovascular accident) ([place]) 05/02/2016  . Left-sided weakness 05/01/2016    Harrel Carina, MS, OTR/L 12/09/2017, 2:19 PM  [place] MAIN [facility] SERVICES [address] [place], [place], [address] Phone: [phone]   Fax:  [phone]  Name: LUPE BONNER MRN: [id number] Date of Birth: May 08, 1965

## 2017-12-11 ENCOUNTER — Encounter: Payer: Self-pay | Admitting: Occupational Therapy

## 2017-12-11 ENCOUNTER — Ambulatory Visit: Payer: Medicare HMO

## 2017-12-11 ENCOUNTER — Ambulatory Visit: Payer: Medicare HMO | Admitting: Occupational Therapy

## 2017-12-11 DIAGNOSIS — R278 Other lack of coordination: Secondary | ICD-10-CM

## 2017-12-11 DIAGNOSIS — M6281 Muscle weakness (generalized): Secondary | ICD-10-CM

## 2017-12-11 DIAGNOSIS — I69351 Hemiplegia and hemiparesis following cerebral infarction affecting right dominant side: Secondary | ICD-10-CM

## 2017-12-11 DIAGNOSIS — R2689 Other abnormalities of gait and mobility: Secondary | ICD-10-CM

## 2017-12-11 NOTE — Therapy (Signed)
[place] MAIN [facility] SERVICES [address] [place], [place], [address] Phone: [phone]   Fax:  [phone]  Physical Therapy Treatment  Patient Details  Name: Curtis Powell MRN: [id number] Date of Birth: 1965-10-10 Referring Provider: Dr. Larinda Buttery   Encounter Date: 12/11/2017  PT End of Session - 12/11/17 1522    Visit Number  5    Number of Visits  75    Date for PT Re-Evaluation  12/16/17    Authorization Type  3/10 ( PN start 8/22)     PT Start Time  1515    PT Stop Time  1600    PT Time Calculation (min)  45 min    Equipment Utilized During Treatment  Gait belt    Activity Tolerance  Patient tolerated treatment well;No increased pain    Behavior During Therapy  WFL for tasks assessed/performed       Past Medical History:  Diagnosis Date  . GERD (gastroesophageal reflux disease)   . Hyperlipidemia   . Hypertension   . Obesity   . Stroke [facility])     Past Surgical History:  Procedure Laterality Date  . BRAIN SURGERY      There were no vitals filed for this visit.  Subjective Assessment - 12/11/17 1521    Subjective  Patient present with no pain or falls. Reports having difficulty with caregivers still. Patient reports compliance with HEP. Reports moving has been going well, expected move date is the 15th.     Pertinent History   Patient is a pleasant 52 year old male who presents to physical therapy for weakness and immobility secondary to CVA.  Had a stroke in Feb 2018. He was previously fully independent, but this stroke caused severe residual deficits, mainly on the right side as well as speech, and now he is unable to walk or perform most of his ADLs on his own. Entire right side is very weak. He still has a little difficulty with speech but his swallowing is improved to baseline. His mom had to move in with him and now is his main caregiver.     Limitations  Sitting;Lifting;Standing;Walking;House hold  activities    How long can you sit comfortably?  5 minutes    How long can you stand comfortably?  5 minutes    How long can you walk comfortably?  10 ft    Patient Stated Goals  Walk without walker, walk further with walker, get some strength in back     Currently in Pain?  No/denies       Treatment:  Transfer training wc to stand  with RW and CGA from WC x 5    Nustep Lvl 4 3 minutes RPM >60 for cardiovascular challenge   Ambulate in hallway with RW 100  feet and CGA  with WC follow, cues for planting right foot and control swing phase of LLE to reduce buckling of R knee. Frequent cueing for upright posture and foot clearance    Large step forward over line and back; 10x each leg. Patient reports it was easier with LLE than RLE;  Standing side stepping left and right  length of bars 2x; VC for posture correction    Standing lunge stretch 2x30 seconds BLE Min A to low back/pelvis for forward push.     Seated LAQ BLE x 10 ; 5 second holds     Pt. response to medical necessity:  Patient will continue to benefit from  skilled physical therapy to improve strength balance and gait safety                        PT Education - 12/11/17 1522    Education provided  Yes    Education Details  exercise technique     Person(s) Educated  Patient    Methods  Explanation;Demonstration;Verbal cues    Comprehension  Verbalized understanding;Returned demonstration       PT Short Term Goals - 11/04/17 1442      PT SHORT TERM GOAL #1   Title  Patient will be independent in home exercise program to improve strength/mobility for better functional independence with ADLs.    Baseline  hep compliant    Time  2    Period  Weeks    Status  Achieved      PT SHORT TERM GOAL #2   Title  Patient will require min cueing for STS transfer with CGA for increased independence with mobility.     Baseline  CGA    Time  2    Period  Weeks    Status  Achieved      PT SHORT TERM GOAL  #3   Title  Patient will maintain upright posture for > 15 seconds to demonstrate strengthened postural control muscualture     Baseline  18 seconds     Time  2    Period  Weeks    Status  Achieved      PT SHORT TERM GOAL #4   Title  Patient will perform         PT Long Term Goals - 11/27/17 1308      PT LONG TERM GOAL #1   Title  Patient will perform bed mobility with Supervision to increase independent mobility    Baseline  able to perform mod I for increased time , occasional Min A for scooting up in bed; 7/16: min guard for safety with increased effort and time, cues to scoot back in bed before sit>supine    Time  8    Period  Weeks    Status  Partially Met      PT LONG TERM GOAL #2   Title  Patient (< 71 years old) will complete five times sit to stand test in < 10 seconds indicating an increased LE strength and improved balance.    Baseline  2/18: 30 seconds from plinth chair height single UE on seat and single on walker ; 32 seconds from plinth with single UE on seat and single on walker. 4/10: 26 seconds from [facility] with one hand on walker5/29: 22.2 seconds with one hand on walker one hand on WC; 7/16: one hand on walker one hand on WC 18.96 seconds; pt achieving 75% of full upright standing 8/22:one hand on walker one hand on WC 18 seconds; pt achieving 75% of full upright standing     Time  8    Period  Weeks    Status  Partially Met      PT LONG TERM GOAL #3   Title  Patient will ambulate 40 ft with walker with no breaks to allow for increased mobility within home.     Baseline  12/26: 29 ft with walker and WC follow with no rest breaks; 2/18: multiple trials previous session of >100 ft     Time  8    Period  Weeks    Status  Achieved  PT LONG TERM GOAL #4   Title  Patient will increase BLE gross strength to 4+/5 as to improve functional strength for independent gait, increased standing tolerance and increased ADL ability.    Baseline   4-/5 RLE,     Time  8     Period  Weeks    Status  Partially Met      PT LONG TERM GOAL #5   Title  Patient will increase lower extremity functional scale to >40/80 to demonstrate improved functional mobility and increased tolerance with ADLs.     Baseline  14/80; 12/26: 21/80; 2/18: 28/80  4/10; 25/80 5/29: 27/80; 7/16: 29/80 8/22: 30/80     Time  8    Period  Weeks    Status  Partially Met      PT LONG TERM GOAL #6   Title  Patient will walk through a door involving one small step up/down to increase functional independence.     Baseline  require CGA and min Verbal cueing with BUE support in // bars at this time    Time  8    Period  Weeks    Status  Partially Met      PT LONG TERM GOAL #7   Title  Patient will ambulate 40 ft with least assistive device and Supervision with no breaks to allow for increased mobility within home.     Baseline  requires use of walker     Time  8    Period  Weeks    Status  Partially Met      PT LONG TERM GOAL #8   Title  Patient will perform 10 MWT in < 1 minute to demonstrate improved ambulatory velocity and capacity.     Baseline  4/10: 2 minutes 45 seconds; 5/29: 2 minute 31 seconds; 3 minutes 22 seconds 8/22: 3 minutes 2 seconds     Time  8    Period  Weeks    Status  Partially Met            Plan - 12/11/17 1554    Clinical Impression Statement  Patient demonstrates improved capacity for functional ambulation. Frequent cueing for posture and weight acceptance required to improve gait mechanics for mobility. Patient will continue to benefit from skilled physical therapy to improve strength balance and gait safety    Clinical Impairments Affecting Rehab Potential  hx of HTN, HLD, CVA, learning disability, Diabetes, brain tumor,     PT Frequency  2x / week    PT Duration  8 weeks    PT Treatment/Interventions  ADLs/Self Care Home Management;Aquatic Therapy;Ultrasound;Moist Heat;Traction;DME Instruction;Gait training;Stair training;Functional mobility  training;Therapeutic activities;Therapeutic exercise;Orthotic Fit/Training;Neuromuscular re-education;Balance training;Patient/family education;Manual techniques;Wheelchair mobility training;Passive range of motion;Energy conservation;Taping;Visual/perceptual remediation/compensation    PT Next Visit Plan  floor recovery; pt requests to review HEP and to have handout provided     PT Home Exercise Plan  TrA contraction     Consulted and Agree with Plan of Care  Patient       Patient will benefit from skilled therapeutic intervention in order to improve the following deficits and impairments:  Abnormal gait, Decreased activity tolerance, Decreased balance, Decreased knowledge of precautions, Decreased endurance, Decreased coordination, Decreased knowledge of use of DME, Decreased mobility, Decreased range of motion, Difficulty walking, Decreased safety awareness, Decreased strength, Impaired flexibility, Impaired perceived functional ability, Impaired tone, Postural dysfunction, Improper body mechanics, Pain  Visit Diagnosis: Muscle weakness (generalized)  Other lack of coordination  Hemiplegia and  hemiparesis following cerebral infarction affecting right dominant side (HCC)  Other abnormalities of gait and mobility     Problem List Patient Active Problem List   Diagnosis Date Noted  . Acute CVA (cerebrovascular accident) ([place]) 05/02/2016  . Left-sided weakness 05/01/2016   Janna Arch, PT, DPT   12/11/2017, 4:01 PM  [place] MAIN [facility] SERVICES [address] [place], [place], [address] Phone: [phone]   Fax:  [phone]  Name: Curtis Powell MRN: [id number] Date of Birth: Oct 30, 1965

## 2017-12-11 NOTE — Therapy (Signed)
[place] MAIN [facility] SERVICES [address] [place], [place], [address] Phone: [phone]   Fax:  [phone]  Occupational Therapy Treatment  Patient Details  Name: Curtis Powell MRN: [id number] Date of Birth: Feb 13, 1966 Referring Provider: Dr. Larinda Buttery   Encounter Date: 12/11/2017  OT End of Session - 12/11/17 1612    Visit Number  5    Number of Visits  85    Date for OT Re-Evaluation  01/14/18    Authorization Type  Visit 3 of 10 for progress reporting period starting 10/07/2017    OT Start Time  1603    OT Stop Time  1645    OT Time Calculation (min)  42 min    Activity Tolerance  Patient tolerated treatment well    Behavior During Therapy  [facility] for tasks assessed/performed       Past Medical History:  Diagnosis Date  . GERD (gastroesophageal reflux disease)   . Hyperlipidemia   . Hypertension   . Obesity   . Stroke [facility])     Past Surgical History:  Procedure Laterality Date  . BRAIN SURGERY      There were no vitals filed for this visit.  Subjective Assessment - 12/11/17 1606    Subjective   Pt. reports that he is moving, and supposed to be out of his apartment by Sept. 15th.    Pertinent History  Pt. is a 52 y.o. male who suffered a CVA on 05/01/2016. Pt. was admitted to the hospital. Once discharged, he received Home Health PT and OT services for about a month. Pt. has had multiple CVAs over the past 8 years, and has had multiple falls in the past 6 months. Pt. resides in an apartment. Pt. has caregivers for 80 hours. Pt.'s mother stays with pt. at night, and assists with IADL tasks.      Patient Stated Goals  To be able to throw a ball and dribble a ball.    Currently in Pain?  No/denies      OT TREATMENT    Neuro muscular re-education:  Pt. worked on grasping heavy duty resistive magnets of varying sizes. Pt. worked on placing them on a whiteboard positioned at a vertical angle on the tabletop, on an  elevated surface to encourage [facility] while UEs are elevated. Pt. worked on Hospital doctor with resistance.Pt. worked on [facility] grasping 1/2" small magnetic pegs, disconnecting them, and placing them on a positioned at a vertical angle at a tabletop. Pt. worked on storing the pegs in his hand, and moving them through his hand to the tip of his 2nd digit, and thumb in preparation for using it. Pt. worked on removing the pegs while alternating thumb opposition to the tip of the 2nd through 5th digits. Pt. Requires cues to engage his right UE. Pt. worked on grasping, and positioning magnetic hooks on a whiteboard positioned at a vertical angle on the tabletop. Pt. worked on [facility] skills grasping 1/2" flat washers, and placing them on the hooks. Pt. dropped several washers from his right hand, increasing when removing the pegs.                           OT Education - 12/11/17 1611    Education provided  Yes    Education Details  strengthening, and coordination    Person(s) Educated  Patient    Methods  Explanation;Demonstration    Comprehension  Verbalized understanding;Returned demonstration;Verbal cues required;Need further instruction       OT Short Term Goals - 03/03/17 1459      OT SHORT TERM GOAL #1   Title  `        OT Long Term Goals - 11/20/17 1844      OT LONG TERM GOAL #1   Title  Pt. will UE functioning to be able to perfrom toilet hygiene with minimal assistance.    Baseline  Pt. conintues to require assist    Time  12    Period  Weeks    Status  On-going    Target Date  01/14/18      OT LONG TERM GOAL #2   Title  Pt. will complete self grooming with minA    Baseline  Pt. is improving indedependent with oral care with set-up, however requires extensive assist for self-grooming tasls.    Time  12    Period  Weeks    Status  On-going    Target Date  01/14/18      OT LONG TERM GOAL #3   Title  Pt. will perform self-feeding skills with modified  indepndence    Baseline   Pt. is able to sccop, and spear the food off of the plate, however requires assist for the hand to mouth pattern. Pt. no longer needs to use built-up foam handle for utensils.    Time  12    Period  Weeks    Status  On-going    Target Date  01/14/18      OT LONG TERM GOAL #4   Title  Pt. will perform self dressing with minA and A/E as needed.    Baseline  Pt. continues to require extensive assist: Pt. continues to have difficulty perfroming UE,a nd LE dressing.    Time  12    Period  Weeks    Status  On-going    Target Date  01/14/18      OT LONG TERM GOAL #5   Title  Pt. will perform light home making tasks with minA    Baseline  Pt. continues to have difficulty with folding laundry, washing dishes while standing at the sinkside. Pt. is able to make a sandwich, and heat things up in th microwave.    Time  12    Period  Weeks    Status  On-going    Target Date  01/14/18      OT LONG TERM GOAL #6   Title  Pt. will write his name efficiently with 100% legibility    Baseline   60% legibility for name.    Time  12    Period  Weeks    Status  On-going    Target Date  01/14/18            Plan - 12/11/17 1612    Clinical Impression Statement  Pt. reports that he has a new aide now, which he reports is working out well so far. Pt. is making progress overall, and has no pain in the Right UE. Pt. Continues to work on improving BUE strength, and [facility] skills in order to improve UE engagement, and ADL and IADL functioning.    Occupational performance deficits (Please refer to evaluation for details):  ADL's;IADL's    Rehab Potential  Fair    OT Frequency  2x / week    OT Duration  12 weeks    OT Treatment/Interventions  Self-care/ADL training;Patient/family education;DME and/or  AE instruction;Therapeutic exercise;Moist Heat;Neuromuscular education    Clinical Decision Making  Several treatment options, min-mod task modification necessary    Consulted and  Agree with Plan of Care  Patient       Patient will benefit from skilled therapeutic intervention in order to improve the following deficits and impairments:  Pain, Decreased cognition, Decreased activity tolerance, Impaired UE functional use, Decreased range of motion, Decreased coordination, Decreased endurance  Visit Diagnosis: Muscle weakness (generalized)  Other lack of coordination    Problem List Patient Active Problem List   Diagnosis Date Noted  . Acute CVA (cerebrovascular accident) ([place]) 05/02/2016  . Left-sided weakness 05/01/2016    Harrel Carina, MS, OTR/L 12/11/2017, 4:22 PM  [place] MAIN [facility] SERVICES [address] [place], [place], [address] Phone: [phone]   Fax:  [phone]  Name: Curtis Powell MRN: [id number] Date of Birth: [birth year]

## 2017-12-16 ENCOUNTER — Encounter: Payer: Medicare HMO | Admitting: Speech Pathology

## 2017-12-16 ENCOUNTER — Ambulatory Visit: Payer: Medicare HMO

## 2017-12-18 ENCOUNTER — Ambulatory Visit: Payer: Medicare HMO | Admitting: Occupational Therapy

## 2017-12-18 ENCOUNTER — Encounter: Payer: Self-pay | Admitting: Occupational Therapy

## 2017-12-18 ENCOUNTER — Ambulatory Visit: Payer: Medicare HMO

## 2017-12-18 DIAGNOSIS — M6281 Muscle weakness (generalized): Secondary | ICD-10-CM

## 2017-12-18 DIAGNOSIS — I69351 Hemiplegia and hemiparesis following cerebral infarction affecting right dominant side: Secondary | ICD-10-CM

## 2017-12-18 DIAGNOSIS — R278 Other lack of coordination: Secondary | ICD-10-CM

## 2017-12-18 DIAGNOSIS — R2689 Other abnormalities of gait and mobility: Secondary | ICD-10-CM

## 2017-12-18 NOTE — Therapy (Signed)
[place] MAIN [facility] SERVICES [address] [place], [place], [address] Phone: [phone]   Fax:  [phone]  Physical Therapy Treatment  Patient Details  Name: Curtis Powell MRN: [id number] Date of Birth: 1965-12-04 Referring Provider: Dr. Larinda Buttery   Encounter Date: 12/18/2017  PT End of Session - 12/18/17 1353    Visit Number  58    Number of Visits  41    Date for PT Re-Evaluation  02/12/18    Authorization Type  4/10 ( PN start 8/22)     PT Start Time  1347    PT Stop Time  1430    PT Time Calculation (min)  43 min    Equipment Utilized During Treatment  Gait belt    Activity Tolerance  Patient tolerated treatment well;No increased pain    Behavior During Therapy  WFL for tasks assessed/performed       Past Medical History:  Diagnosis Date  . GERD (gastroesophageal reflux disease)   . Hyperlipidemia   . Hypertension   . Obesity   . Stroke [facility])     Past Surgical History:  Procedure Laterality Date  . BRAIN SURGERY      There were no vitals filed for this visit.  Subjective Assessment - 12/18/17 1454    Subjective  Patient missed last session due to moving to a new apartment. is having difficulty adjusting to ambulating on carpet at new location but reports no falls or LOB since last session. Feels like he is gaining mobility and would like to continue improving his ambulation and stability.     Pertinent History   Patient is a pleasant 52 year old male who presents to physical therapy for weakness and immobility secondary to CVA.  Had a stroke in Feb 2018. He was previously fully independent, but this stroke caused severe residual deficits, mainly on the right side as well as speech, and now he is unable to walk or perform most of his ADLs on his own. Entire right side is very weak. He still has a little difficulty with speech but his swallowing is improved to baseline. His mom had to move in with him and now  is his main caregiver.     Limitations  Sitting;Lifting;Standing;Walking;House hold activities    How long can you sit comfortably?  5 minutes    How long can you stand comfortably?  5 minutes    How long can you walk comfortably?  10 ft    Patient Stated Goals  Walk without walker, walk further with walker, get some strength in back     Currently in Pain?  No/denies       Review goals:  5x STS: 18 seconds with 75% standing  LEFS:26/80  Small step up and down ; able to perform with BUE support  10 MWT 2 min 43 seconds  Static stance without UE support  3 minutes 40 second hold  BERG: 20/ 56  (high fall risk: use a walker full time)   [facility] PT Assessment - 12/18/17 0001      Standardized Balance Assessment   Standardized Balance Assessment  Berg Balance Test      Berg Balance Test   Sit to Stand  Able to stand using hands after several tries    Standing Unsupported  Able to stand 2 minutes with supervision    Sitting with Back Unsupported but Feet Supported on Floor or Stool  Able to sit safely and  securely 2 minutes    Stand to Sit  Uses backs of legs against chair to control descent    Transfers  Able to transfer with verbal cueing and /or supervision    Standing Unsupported with Eyes Closed  Able to stand 10 seconds with supervision    Standing Ubsupported with Feet Together  Needs help to attain position but able to stand for 30 seconds with feet together    From Standing, Reach Forward with Outstretched Arm  Reaches forward but needs supervision    From Standing Position, Pick up Object from Floor  Unable to try/needs assist to keep balance    From Standing Position, Turn to Look Behind Over each Shoulder  Needs supervision when turning    Turn 360 Degrees  Needs assistance while turning    Standing Unsupported, Alternately Place Feet on Step/Stool  Able to complete >2 steps/needs minimal assist    Standing Unsupported, One Foot in Front  Loses balance while stepping or  standing    Standing on One Leg  Unable to try or needs assist to prevent fall    Total Score  20                           PT Education - 12/18/17 1353    Education provided  Yes    Education Details  goals, re-cert     Person(s) Educated  Patient    Methods  Explanation;Demonstration;Verbal cues    Comprehension  Verbalized understanding;Returned demonstration       PT Short Term Goals - 12/18/17 1500      PT SHORT TERM GOAL #1   Title  Patient will be independent in home exercise program to improve strength/mobility for better functional independence with ADLs.    Baseline  hep compliant    Time  2    Period  Weeks    Status  Achieved      PT SHORT TERM GOAL #2   Title  Patient will require min cueing for STS transfer with CGA for increased independence with mobility.     Baseline  CGA    Time  2    Period  Weeks    Status  Achieved      PT SHORT TERM GOAL #3   Title  Patient will maintain upright posture for > 15 seconds to demonstrate strengthened postural control muscualture     Baseline  18 seconds     Time  2    Period  Weeks    Status  Achieved        PT Long Term Goals - 12/18/17 1407      PT LONG TERM GOAL #1   Title  Patient will perform bed mobility with Supervision to increase independent mobility    Baseline  able to perform mod I for increased time , occasional Min A for scooting up in bed; 7/16: min guard for safety with increased effort and time, cues to scoot back in bed before sit>supine 9/12: can do it independently but with difficulty    Time  8    Period  Weeks    Status  Achieved      PT LONG TERM GOAL #2   Title  Patient (< 50 years old) will complete five times sit to stand test in < 10 seconds indicating an increased LE strength and improved balance.    Baseline  2/18: 30 seconds from plinth chair height  single UE on seat and single on walker ; 32 seconds from plinth with single UE on seat and single on walker. 4/10:  26 seconds from [facility] with one hand on walker5/29: 22.2 seconds with one hand on walker one hand on WC; 7/16: one hand on walker one hand on WC 18.96 seconds; pt achieving 75% of full upright standing 8/22:one hand on walker one hand on WC 18 seconds; pt achieving 75% of full upright standing  9/12: 18 seconds econds; pt achieving 75% of full upright standing 8/22:one hand on walker one hand on WC    Time  8    Period  Weeks    Status  Partially Met      PT LONG TERM GOAL #3   Title  Patient will ambulate 40 ft with walker with no breaks to allow for increased mobility within home.     Baseline  12/26: 29 ft with walker and WC follow with no rest breaks; 2/18: multiple trials previous session of >100 ft     Time  8    Period  Weeks    Status  Achieved      PT LONG TERM GOAL #4   Title  Patient will increase BLE gross strength to 4+/5 as to improve functional strength for independent gait, increased standing tolerance and increased ADL ability.    Baseline   4-/5 RLE,     Time  8    Period  Weeks    Status  Partially Met    Target Date  02/12/18      PT LONG TERM GOAL #5   Title  Patient will increase lower extremity functional scale to >40/80 to demonstrate improved functional mobility and increased tolerance with ADLs.     Baseline  14/80; 12/26: 21/80; 2/18: 28/80  4/10; 25/80 5/29: 27/80; 7/16: 29/80 8/22: 30/80  9/12: 26/80 (feeling more tired due to moving)    Time  8    Period  Weeks    Status  Partially Met    Target Date  02/12/18      Additional Long Term Goals   Additional Long Term Goals  Yes      PT LONG TERM GOAL #6   Title  Patient will walk through a door involving one small step up/down to increase functional independence.     Baseline  require CGA  BUE support in // bars at this time    Time  8    Period  Weeks    Status  Partially Met    Target Date  02/12/18      PT LONG TERM GOAL #7   Title  Patient will ambulate 40 ft with least assistive device and  Supervision with no breaks to allow for increased mobility within home.     Baseline  requires use of walker and CGA     Time  8    Period  Weeks    Status  Partially Met    Target Date  02/12/18      PT LONG TERM GOAL #8   Title  Patient will perform 10 MWT in < 1 minute to demonstrate improved ambulatory velocity and capacity.     Baseline  4/10: 2 minutes 45 seconds; 5/29: 2 minute 31 seconds; 3 minutes 22 seconds 8/22: 3 minutes 2 seconds  9/12:  2 minutes and 43 seconds     Time  8    Period  Weeks    Status  Partially  Met    Target Date  02/12/18      PT LONG TERM GOAL  #9   TITLE  Patient will increase Berg Balance score by > 6 points (26/56) to demonstrate decreased fall risk during functional activities.    Baseline  9/12: 20/56    Time  8    Period  Weeks    Status  New    Target Date  02/12/18            Plan - 12/18/17 1459    Clinical Impression Statement  Patient has demonstrated progress despite short amount of time between retest dates with improved 10 MWT time: which was performed in 2 minutes and 43 seconds. Patient has noted improved quality of mobility however would like to continue working on stability, BERG was performed for first time with patient scoring 20/56 placing him at a high fall risk. Patient's condition has the potential to improve in response to therapy. Maximum improvement is yet to be obtained. The anticipated improvement is attainable and reasonable in a generally predictable time. Patient will continue to benefit from skilled physical therapy to improve strength balance and gait safety     Clinical Impairments Affecting Rehab Potential  hx of HTN, HLD, CVA, learning disability, Diabetes, brain tumor,     PT Frequency  2x / week    PT Duration  8 weeks    PT Treatment/Interventions  ADLs/Self Care Home Management;Aquatic Therapy;Ultrasound;Moist Heat;Traction;DME Instruction;Gait training;Stair training;Functional mobility training;Therapeutic  activities;Therapeutic exercise;Orthotic Fit/Training;Neuromuscular re-education;Balance training;Patient/family education;Manual techniques;Wheelchair mobility training;Passive range of motion;Energy conservation;Taping;Visual/perceptual remediation/compensation    PT Next Visit Plan  balance and stability     PT Home Exercise Plan  TrA contraction     Consulted and Agree with Plan of Care  Patient       Patient will benefit from skilled therapeutic intervention in order to improve the following deficits and impairments:  Abnormal gait, Decreased activity tolerance, Decreased balance, Decreased knowledge of precautions, Decreased endurance, Decreased coordination, Decreased knowledge of use of DME, Decreased mobility, Decreased range of motion, Difficulty walking, Decreased safety awareness, Decreased strength, Impaired flexibility, Impaired perceived functional ability, Impaired tone, Postural dysfunction, Improper body mechanics, Pain  Visit Diagnosis: Muscle weakness (generalized)  Other lack of coordination  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 Acute CVA (cerebrovascular accident) ([place]) 05/02/2016  . Left-sided weakness 05/01/2016   Janna Arch, PT, DPT    12/18/2017, 3:04 PM  [place] MAIN [facility] SERVICES [address] [place], [place], [address] Phone: [phone]   Fax:  [phone]  Name: Curtis Powell MRN: [id number] Date of Birth: 09-07-1965

## 2017-12-18 NOTE — Therapy (Addendum)
[place] MAIN [facility] SERVICES [address] [place], [place], [address] Phone: [phone]   Fax:  [phone]  Occupational Therapy Treatment  Patient Details  Name: Curtis Powell MRN: [id number] Date of Birth: 10/24/1965 Referring Provider: Dr. Larinda Buttery   Encounter Date: 12/18/2017  OT End of Session - 12/18/17 1730    Visit Number  61    Number of Visits  65    Date for OT Re-Evaluation  01/14/18    Authorization Type  Visit 4 of 10 for progress reporting period starting 10/07/2017    OT Start Time  1345    OT Stop Time  1430    OT Time Calculation (min)  45 min    Activity Tolerance  Patient tolerated treatment well    Behavior During Therapy  [facility] for tasks assessed/performed       Past Medical History:  Diagnosis Date  . GERD (gastroesophageal reflux disease)   . Hyperlipidemia   . Hypertension   . Obesity   . Stroke [facility])     Past Surgical History:  Procedure Laterality Date  . BRAIN SURGERY      There were no vitals filed for this visit.  Subjective Assessment - 12/18/17 1729    Subjective   Pt. reports he has moved out of his apartment, and is now in a new apartment.    Pertinent History  Pt. is a 52 y.o. male who suffered a CVA on 05/01/2016. Pt. was admitted to the hospital. Once discharged, he received Home Health PT and OT services for about a month. Pt. has had multiple CVAs over the past 8 years, and has had multiple falls in the past 6 months. Pt. resides in an apartment. Pt. has caregivers for 80 hours. Pt.'s mother stays with pt. at night, and assists with IADL tasks.      Patient Stated Goals  To be able to throw a ball and dribble a ball.    Currently in Pain?  Yes    Pain Score  5     Pain Location  Shoulder    Pain Orientation  Right    Pain Descriptors / Indicators  Aching    Pain Type  Chronic pain       OT TREATMENT    Therapeutic Exercise:  Pt. performed 2# dowel ex. For UE  strengthening secondary to weakness. Bilateral shoulder flexion, chest press, circular patterns, and elbow flexion/extension were performed. 2# for elbow, forearm supination/pronation, wrist flexion/extension, and radial deviation. Pt. requires rest breaks and verbal cues for proper technique. Pt. performed gross gripping with grip strengthener. Pt. worked on sustaining grip while grasping pegs and reaching at various heights with the LUE, and flat at the tabletop with the right. Gripper was placed in the resistive slot with the white resistive spring.  Selfcare:  Pt. was able to complete toileting tasks with CGA using the grab bar on the left while standing. Pt. was able to hike clothing, however required minA to hike the very back of his pants.                        OT Education - 12/18/17 1730    Education provided  Yes    Education Details  strengthening, and coordination    Person(s) Educated  Patient    Methods  Explanation;Demonstration    Comprehension  Verbalized understanding;Returned demonstration;Verbal cues required;Need further instruction  OT Short Term Goals - 03/03/17 1459      OT SHORT TERM GOAL #1   Title  `        OT Long Term Goals - 11/20/17 1844      OT LONG TERM GOAL #1   Title  Pt. will UE functioning to be able to perfrom toilet hygiene with minimal assistance.    Baseline  Pt. conintues to require assist    Time  12    Period  Weeks    Status  On-going    Target Date  01/14/18      OT LONG TERM GOAL #2   Title  Pt. will complete self grooming with minA    Baseline  Pt. is improving indedependent with oral care with set-up, however requires extensive assist for self-grooming tasls.    Time  12    Period  Weeks    Status  On-going    Target Date  01/14/18      OT LONG TERM GOAL #3   Title  Pt. will perform self-feeding skills with modified indepndence    Baseline   Pt. is able to sccop, and spear the food off of the plate,  however requires assist for the hand to mouth pattern. Pt. no longer needs to use built-up foam handle for utensils.    Time  12    Period  Weeks    Status  On-going    Target Date  01/14/18      OT LONG TERM GOAL #4   Title  Pt. will perform self dressing with minA and A/E as needed.    Baseline  Pt. continues to require extensive assist: Pt. continues to have difficulty perfroming UE,a nd LE dressing.    Time  12    Period  Weeks    Status  On-going    Target Date  01/14/18      OT LONG TERM GOAL #5   Title  Pt. will perform light home making tasks with minA    Baseline  Pt. continues to have difficulty with folding laundry, washing dishes while standing at the sinkside. Pt. is able to make a sandwich, and heat things up in th microwave.    Time  12    Period  Weeks    Status  On-going    Target Date  01/14/18      OT LONG TERM GOAL #6   Title  Pt. will write his name efficiently with 100% legibility    Baseline   60% legibility for name.    Time  12    Period  Weeks    Status  On-going    Target Date  01/14/18            Plan - 12/18/17 1731    Clinical Impression Statement Pt. has moved into a new apartment. Pt. reports the doorways are wide enough to push his wheelchair, and walker through. Pt. reports that the bathroom has a grab bar in it which is helpful for him when standing to use the the toilet. Pt. continues to present with limited UE strength, and [facility] skills. Pt. continues to work on improving these skills for improved functional use during ADLs, and IADLs.    Occupational performance deficits (Please refer to evaluation for details):  ADL's;IADL's    Rehab Potential  Fair    OT Frequency  2x / week    OT Duration  12 weeks    OT Treatment/Interventions  Self-care/ADL training;Patient/family education;DME and/or AE instruction;Therapeutic exercise;Moist Heat;Neuromuscular education    Clinical Decision Making  Several treatment options, min-mod task  modification necessary    Consulted and Agree with Plan of Care  Patient       Patient will benefit from skilled therapeutic intervention in order to improve the following deficits and impairments:  Pain, Decreased cognition, Decreased activity tolerance, Impaired UE functional use, Decreased range of motion, Decreased coordination, Decreased endurance  Visit Diagnosis: Muscle weakness (generalized)    Problem List Patient Active Problem List   Diagnosis Date Noted  . Acute CVA (cerebrovascular accident) ([place]) 05/02/2016  . Left-sided weakness 05/01/2016    Harrel Carina, MS, OTR/L 12/18/2017, 6:32 PM  [place] MAIN [facility] SERVICES [address] [place], [place], [address] Phone: [phone]   Fax:  [phone]  Name: Curtis Powell MRN: [id number] Date of Birth: 10-11-1965

## 2017-12-23 ENCOUNTER — Encounter: Payer: Self-pay | Admitting: Physical Therapy

## 2017-12-23 ENCOUNTER — Ambulatory Visit: Payer: Medicare HMO | Admitting: Physical Therapy

## 2017-12-23 DIAGNOSIS — R278 Other lack of coordination: Secondary | ICD-10-CM

## 2017-12-23 DIAGNOSIS — M6281 Muscle weakness (generalized): Secondary | ICD-10-CM

## 2017-12-23 DIAGNOSIS — I69351 Hemiplegia and hemiparesis following cerebral infarction affecting right dominant side: Secondary | ICD-10-CM

## 2017-12-23 DIAGNOSIS — R2689 Other abnormalities of gait and mobility: Secondary | ICD-10-CM

## 2017-12-23 NOTE — Therapy (Signed)
[place] MAIN [facility] SERVICES [address] [place], [place], [address] Phone: [phone]   Fax:  [phone]  Physical Therapy Treatment  Patient Details  Name: Curtis Powell MRN: [id number] Date of Birth: 1965/06/26 Referring Provider: Dr. Larinda Buttery   Encounter Date: 12/23/2017  PT End of Session - 12/23/17 1429    Visit Number  58    Number of Visits  16    Date for PT Re-Evaluation  02/12/18    Authorization Type  5/10 ( PN start 8/22)     PT Start Time  0150    PT Stop Time  0230    PT Time Calculation (min)  40 min    Equipment Utilized During Treatment  Gait belt    Activity Tolerance  Patient tolerated treatment well;No increased pain    Behavior During Therapy  WFL for tasks assessed/performed       Past Medical History:  Diagnosis Date  . GERD (gastroesophageal reflux disease)   . Hyperlipidemia   . Hypertension   . Obesity   . Stroke [facility])     Past Surgical History:  Procedure Laterality Date  . BRAIN SURGERY      There were no vitals filed for this visit.  Subjective Assessment - 12/23/17 1427    Subjective  Patient is at new location but reports no falls or LOB since last session. Feels like he is gaining mobility and would like to continue improving his ambulation and stability.     Pertinent History   Patient is a pleasant 52 year old male who presents to physical therapy for weakness and immobility secondary to CVA.  Had a stroke in Feb 2018. He was previously fully independent, but this stroke caused severe residual deficits, mainly on the right side as well as speech, and now he is unable to walk or perform most of his ADLs on his own. Entire right side is very weak. He still has a little difficulty with speech but his swallowing is improved to baseline. His mom had to move in with him and now is his main caregiver.     Limitations  Sitting;Lifting;Standing;Walking;House hold activities    How  long can you sit comfortably?  5 minutes    How long can you stand comfortably?  5 minutes    How long can you walk comfortably?  10 ft    Patient Stated Goals  Walk without walker, walk further with walker, get some strength in back     Currently in Pain?  No/denies    Pain Score  0-No pain    Pain Onset  Yesterday       Treatment:  Transfer training wc to stand  with RW and CGA from WC x 5   Nustep x 3 minutes, level 4 RPM >60 for cardiovascular challenge  Ambulate in hallway with RW 100  feet and CGA  with WC follow, cues for planting right foot and control swing phase of LLE to reduce buckling of R knee. Frequent cueing for upright posture and foot clearance  Standing side stepping left and right length of bars 2x; VC for posture correction  Standing lunge stretch 2x30 seconds BLEMin A to low back/pelvis for forward push.  Seated LAQ BLE x 10   Seated hip flex BLE x 10 x 2   Pt. response to medical necessity: Patient will continue to benefit from skilled physical therapy to improve strength balance and gait safety  PT Education - 12/23/17 1428    Education provided  Yes    Education Details  saftey with transfers    Person(s) Educated  Patient    Methods  Explanation;Demonstration;Tactile cues    Comprehension  Verbalized understanding;Returned demonstration       PT Short Term Goals - 12/18/17 1500      PT SHORT TERM GOAL #1   Title  Patient will be independent in home exercise program to improve strength/mobility for better functional independence with ADLs.    Baseline  hep compliant    Time  2    Period  Weeks    Status  Achieved      PT SHORT TERM GOAL #2   Title  Patient will require min cueing for STS transfer with CGA for increased independence with mobility.     Baseline  CGA    Time  2    Period  Weeks    Status  Achieved      PT SHORT TERM GOAL #3   Title  Patient will maintain upright  posture for > 15 seconds to demonstrate strengthened postural control muscualture     Baseline  18 seconds     Time  2    Period  Weeks    Status  Achieved        PT Long Term Goals - 12/18/17 1407      PT LONG TERM GOAL #1   Title  Patient will perform bed mobility with Supervision to increase independent mobility    Baseline  able to perform mod I for increased time , occasional Min A for scooting up in bed; 7/16: min guard for safety with increased effort and time, cues to scoot back in bed before sit>supine 9/12: can do it independently but with difficulty    Time  8    Period  Weeks    Status  Achieved      PT LONG TERM GOAL #2   Title  Patient (< 60 years old) will complete five times sit to stand test in < 10 seconds indicating an increased LE strength and improved balance.    Baseline  2/18: 30 seconds from plinth chair height single UE on seat and single on walker ; 32 seconds from plinth with single UE on seat and single on walker. 4/10: 26 seconds from WC with one hand on walker5/29: 22.2 seconds with one hand on walker one hand on WC; 7/16: one hand on walker one hand on WC 18.96 seconds; pt achieving 75% of full upright standing 8/22:one hand on walker one hand on WC 18 seconds; pt achieving 75% of full upright standing  9/12: 18 seconds econds; pt achieving 75% of full upright standing 8/22:one hand on walker one hand on WC    Time  8    Period  Weeks    Status  Partially Met      PT LONG TERM GOAL #3   Title  Patient will ambulate 40 ft with walker with no breaks to allow for increased mobility within home.     Baseline  12/26: 29 ft with walker and WC follow with no rest breaks; 2/18: multiple trials previous session of >100 ft     Time  8    Period  Weeks    Status  Achieved      PT LONG TERM GOAL #4   Title  Patient will increase BLE gross strength to 4+/5 as to improve functional strength for   independent gait, increased standing tolerance and increased ADL ability.     Baseline   4-/5 RLE,     Time  8    Period  Weeks    Status  Partially Met    Target Date  02/12/18      PT LONG TERM GOAL #5   Title  Patient will increase lower extremity functional scale to >40/80 to demonstrate improved functional mobility and increased tolerance with ADLs.     Baseline  14/80; 12/26: 21/80; 2/18: 28/80  4/10; 25/80 5/29: 27/80; 7/16: 29/80 8/22: 30/80  9/12: 26/80 (feeling more tired due to moving)    Time  8    Period  Weeks    Status  Partially Met    Target Date  02/12/18      Additional Long Term Goals   Additional Long Term Goals  Yes      PT LONG TERM GOAL #6   Title  Patient will walk through a door involving one small step up/down to increase functional independence.     Baseline  require CGA  BUE support in // bars at this time    Time  8    Period  Weeks    Status  Partially Met    Target Date  02/12/18      PT LONG TERM GOAL #7   Title  Patient will ambulate 40 ft with least assistive device and Supervision with no breaks to allow for increased mobility within home.     Baseline  requires use of walker and CGA     Time  8    Period  Weeks    Status  Partially Met    Target Date  02/12/18      PT LONG TERM GOAL #8   Title  Patient will perform 10 MWT in < 1 minute to demonstrate improved ambulatory velocity and capacity.     Baseline  4/10: 2 minutes 45 seconds; 5/29: 2 minute 31 seconds; 3 minutes 22 seconds 8/22: 3 minutes 2 seconds  9/12:  2 minutes and 43 seconds     Time  8    Period  Weeks    Status  Partially Met    Target Date  02/12/18      PT LONG TERM GOAL  #9   TITLE  Patient will increase Berg Balance score by > 6 points (26/56) to demonstrate decreased fall risk during functional activities.    Baseline  9/12: 20/56    Time  8    Period  Weeks    Status  New    Target Date  02/12/18            Plan - 12/23/17 1430    Clinical Impression Statement  Patient demonstrates improved capacity for functional  ambulation. Frequent cueing for posture and weight acceptance required to improve gait mechanics for mobility. Patient will continue to benefit from skilled physical therapy to improve strength balance and gait safety    Clinical Impairments Affecting Rehab Potential  hx of HTN, HLD, CVA, learning disability, Diabetes, brain tumor,     PT Frequency  2x / week    PT Duration  8 weeks    PT Treatment/Interventions  ADLs/Self Care Home Management;Aquatic Therapy;Ultrasound;Moist Heat;Traction;DME Instruction;Gait training;Stair training;Functional mobility training;Therapeutic activities;Therapeutic exercise;Orthotic Fit/Training;Neuromuscular re-education;Balance training;Patient/family education;Manual techniques;Wheelchair mobility training;Passive range of motion;Energy conservation;Taping;Visual/perceptual remediation/compensation    PT Next Visit Plan  balance and stability     PT Home Exercise Plan  TrA contraction     Consulted and Agree with Plan of Care  Patient       Patient will benefit from skilled therapeutic intervention in order to improve the following deficits and impairments:  Abnormal gait, Decreased activity tolerance, Decreased balance, Decreased knowledge of precautions, Decreased endurance, Decreased coordination, Decreased knowledge of use of DME, Decreased mobility, Decreased range of motion, Difficulty walking, Decreased safety awareness, Decreased strength, Impaired flexibility, Impaired perceived functional ability, Impaired tone, Postural dysfunction, Improper body mechanics, Pain  Visit Diagnosis: Muscle weakness (generalized)  Other lack of coordination  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 Acute CVA (cerebrovascular accident) ([place]) 05/02/2016  . Left-sided weakness 05/01/2016    Alanson Puls, [place] DPT 12/23/2017, 2:31 PM  [place] MAIN [facility] SERVICES [address] [place], [place], [address] Phone: [phone]   Fax:  [phone]  Name: LADARREN STEINER MRN: [id number] Date of Birth: 06-Oct-1965

## 2017-12-25 ENCOUNTER — Encounter: Payer: Self-pay | Admitting: Occupational Therapy

## 2017-12-25 ENCOUNTER — Ambulatory Visit: Payer: Medicare HMO | Admitting: Occupational Therapy

## 2017-12-25 ENCOUNTER — Ambulatory Visit: Payer: Medicare HMO

## 2017-12-25 DIAGNOSIS — M6281 Muscle weakness (generalized): Secondary | ICD-10-CM | POA: Diagnosis not present

## 2017-12-25 DIAGNOSIS — R278 Other lack of coordination: Secondary | ICD-10-CM

## 2017-12-25 DIAGNOSIS — R2689 Other abnormalities of gait and mobility: Secondary | ICD-10-CM

## 2017-12-25 NOTE — Therapy (Addendum)
[place] MAIN [facility] SERVICES [address] [place], [place], [address] Phone: [phone]   Fax:  [phone]  Physical Therapy Treatment  Patient Details  Name: Curtis Powell MRN: [id number] Date of Birth: Jun 01, 1965 Referring Provider: Dr. Larinda Buttery   Encounter Date: 12/25/2017  PT End of Session - 12/25/17 1506    Visit Number  86    Number of Visits  46    Date for PT Re-Evaluation  02/12/18    Authorization Type  6/10 ( PN start 8/22)     PT Start Time  1517    PT Stop Time  1559    PT Time Calculation (min)  42 min    Equipment Utilized During Treatment  Gait belt    Activity Tolerance  Patient tolerated treatment well    Behavior During Therapy  WFL for tasks assessed/performed       Past Medical History:  Diagnosis Date  . GERD (gastroesophageal reflux disease)   . Hyperlipidemia   . Hypertension   . Obesity   . Stroke [facility])     Past Surgical History:  Procedure Laterality Date  . BRAIN SURGERY      There were no vitals filed for this visit.  In //bars:  Ambulating in //bars BUE support x2. Verbal cues for upright posture and to increase step length.   Standing side stepping left and right length of bars 2x; VC for posture correction and maintain feet in neutral alignment. CGA    Standing lunge stretch in 2x30 seconds BLE Min A to low back/pelvis for forward push. CGA  Step over half foam roller x10 each leg. Verbal cues to lift knee towards ceiling for increased hip flexion and to shift weight forward. Occasional tactile cueing for anterior weight and hip flexion CGA.    Standing toe taps on 4in step. x5 each leg. Verbal cues for upright posture and increase hip flexion for foot clearance. Exercise terminated due to L knee buckling   Standing reaching with 1UE support in all directions reaching in/out of BOS. x8 each UE. CGA and tactile cueing to bring hips anteriorly. Verbal cues for upright  posture.   Seated LAQ BLE x 10 to improve quad activation and strength   Seated hip flex BLE x 10 x 2 for hip flexion strength  Seated adduction x10 3 second holds. Verbal and tactile cueing to increase and maintain muscle contraction  Bathroom mobility performed at beginning of session: addressed standing balance and mobility                        PT Education - 12/25/17 1505    Education provided  Yes    Education Details  exercise technique, HEP compliance     Person(s) Educated  Patient    Methods  Explanation;Demonstration;Tactile cues    Comprehension  Verbalized understanding;Returned demonstration       PT Short Term Goals - 12/18/17 1500      PT SHORT TERM GOAL #1   Title  Patient will be independent in home exercise program to improve strength/mobility for better functional independence with ADLs.    Baseline  hep compliant    Time  2    Period  Weeks    Status  Achieved      PT SHORT TERM GOAL #2   Title  Patient will require min cueing for STS transfer with CGA for increased independence with mobility.  Baseline  CGA    Time  2    Period  Weeks    Status  Achieved      PT SHORT TERM GOAL #3   Title  Patient will maintain upright posture for > 15 seconds to demonstrate strengthened postural control muscualture     Baseline  18 seconds     Time  2    Period  Weeks    Status  Achieved        PT Long Term Goals - 12/18/17 1407      PT LONG TERM GOAL #1   Title  Patient will perform bed mobility with Supervision to increase independent mobility    Baseline  able to perform mod I for increased time , occasional Min A for scooting up in bed; 7/16: min guard for safety with increased effort and time, cues to scoot back in bed before sit>supine 9/12: can do it independently but with difficulty    Time  8    Period  Weeks    Status  Achieved      PT LONG TERM GOAL #2   Title  Patient (< 80 years old) will complete five times sit to  stand test in < 10 seconds indicating an increased LE strength and improved balance.    Baseline  2/18: 30 seconds from plinth chair height single UE on seat and single on walker ; 32 seconds from plinth with single UE on seat and single on walker. 4/10: 26 seconds from [facility] with one hand on walker5/29: 22.2 seconds with one hand on walker one hand on WC; 7/16: one hand on walker one hand on WC 18.96 seconds; pt achieving 75% of full upright standing 8/22:one hand on walker one hand on WC 18 seconds; pt achieving 75% of full upright standing  9/12: 18 seconds econds; pt achieving 75% of full upright standing 8/22:one hand on walker one hand on WC    Time  8    Period  Weeks    Status  Partially Met      PT LONG TERM GOAL #3   Title  Patient will ambulate 40 ft with walker with no breaks to allow for increased mobility within home.     Baseline  12/26: 29 ft with walker and WC follow with no rest breaks; 2/18: multiple trials previous session of >100 ft     Time  8    Period  Weeks    Status  Achieved      PT LONG TERM GOAL #4   Title  Patient will increase BLE gross strength to 4+/5 as to improve functional strength for independent gait, increased standing tolerance and increased ADL ability.    Baseline   4-/5 RLE,     Time  8    Period  Weeks    Status  Partially Met    Target Date  02/12/18      PT LONG TERM GOAL #5   Title  Patient will increase lower extremity functional scale to >40/80 to demonstrate improved functional mobility and increased tolerance with ADLs.     Baseline  14/80; 12/26: 21/80; 2/18: 28/80  4/10; 25/80 5/29: 27/80; 7/16: 29/80 8/22: 30/80  9/12: 26/80 (feeling more tired due to moving)    Time  8    Period  Weeks    Status  Partially Met    Target Date  02/12/18      Additional Long Term Goals   Additional Long  Term Goals  Yes      PT LONG TERM GOAL #6   Title  Patient will walk through a door involving one small step up/down to increase functional  independence.     Baseline  require CGA  BUE support in // bars at this time    Time  8    Period  Weeks    Status  Partially Met    Target Date  02/12/18      PT LONG TERM GOAL #7   Title  Patient will ambulate 40 ft with least assistive device and Supervision with no breaks to allow for increased mobility within home.     Baseline  requires use of walker and CGA     Time  8    Period  Weeks    Status  Partially Met    Target Date  02/12/18      PT LONG TERM GOAL #8   Title  Patient will perform 10 MWT in < 1 minute to demonstrate improved ambulatory velocity and capacity.     Baseline  4/10: 2 minutes 45 seconds; 5/29: 2 minute 31 seconds; 3 minutes 22 seconds 8/22: 3 minutes 2 seconds  9/12:  2 minutes and 43 seconds     Time  8    Period  Weeks    Status  Partially Met    Target Date  02/12/18      PT LONG TERM GOAL  #9   TITLE  Patient will increase Berg Balance score by > 6 points (26/56) to demonstrate decreased fall risk during functional activities.    Baseline  9/12: 20/56    Time  8    Period  Weeks    Status  New    Target Date  02/12/18            Plan - 12/25/17 1633    Clinical Impression Statement  Patient continues to demonstrate improvements with functional mobility and standing tolerance with focus on standing exercises for ambulation and strength. Patient was able to complete standing step overs with adequate foot clearance for first time today demonstrating continued progression and improvements with hip flexion activation and strength. Patient experienced L knee buckling with toe taps in //bars likely due to muscle fatigue and required ModA to prevent fall. Patient continues to require frequent cueing for upright posture throughout session. Patient will continue to benefit from skilled physical therapy services to improve strength, balance, and gait safety.     Clinical Impairments Affecting Rehab Potential  hx of HTN, HLD, CVA, learning disability,  Diabetes, brain tumor,     PT Frequency  2x / week    PT Duration  8 weeks    PT Treatment/Interventions  ADLs/Self Care Home Management;Aquatic Therapy;Ultrasound;Moist Heat;Traction;DME Instruction;Gait training;Stair training;Functional mobility training;Therapeutic activities;Therapeutic exercise;Orthotic Fit/Training;Neuromuscular re-education;Balance training;Patient/family education;Manual techniques;Wheelchair mobility training;Passive range of motion;Energy conservation;Taping;Visual/perceptual remediation/compensation    PT Next Visit Plan  balance and stability and ambulation      PT Home Exercise Plan  TrA contraction     Consulted and Agree with Plan of Care  Patient       Patient will benefit from skilled therapeutic intervention in order to improve the following deficits and impairments:  Abnormal gait, Decreased activity tolerance, Decreased balance, Decreased knowledge of precautions, Decreased endurance, Decreased coordination, Decreased knowledge of use of DME, Decreased mobility, Decreased range of motion, Difficulty walking, Decreased safety awareness, Decreased strength, Impaired flexibility, Impaired perceived functional ability, Impaired tone, Postural dysfunction,  Improper body mechanics, Pain  Visit Diagnosis: Muscle weakness (generalized)  Other lack of coordination  Other abnormalities of gait and mobility     Problem List Patient Active Problem List   Diagnosis Date Noted  . Acute CVA (cerebrovascular accident) ([place]) 05/02/2016  . Left-sided weakness 05/01/2016   Erick Blinks, SPT  This entire session was performed under direct supervision and direction of a licensed therapist/therapist assistant . I have personally read, edited and approve of the note as written.  Janna Arch, PT, DPT   12/25/2017, 4:48 PM  [place] MAIN [facility] SERVICES [address] [place], [place], [address] Phone: [phone]   Fax:   [phone]  Name: DEMPSEY AHONEN MRN: [id number] Date of Birth: 06-15-1965

## 2017-12-25 NOTE — Therapy (Addendum)
[place] MAIN [facility] SERVICES [address] [place], [place], [address] Phone: [phone]   Fax:  [phone]  Occupational Therapy Treatment  Patient Details  Name: Curtis Powell MRN: [id number] Date of Birth: 1965/06/25 Referring Provider: Dr. Larinda Buttery   Encounter Date: 12/25/2017  OT End of Session - 12/25/17 1622    Visit Number  27    Number of Visits  67    Date for OT Re-Evaluation  01/14/18    Authorization Type  Visit 5 of 10 for progress reporting period starting 10/07/2017    OT Start Time  1430    OT Stop Time  1515    OT Time Calculation (min)  45 min    Activity Tolerance  Patient tolerated treatment well    Behavior During Therapy  [facility] for tasks assessed/performed       Past Medical History:  Diagnosis Date  . GERD (gastroesophageal reflux disease)   . Hyperlipidemia   . Hypertension   . Obesity   . Stroke [facility])     Past Surgical History:  Procedure Laterality Date  . BRAIN SURGERY      There were no vitals filed for this visit.  Subjective Assessment - 12/25/17 1619    Subjective   Pt reports he is acclimating well to new apartment    Pertinent History  Pt. is a 52 y.o. male who suffered a CVA on 05/01/2016. Pt. was admitted to the hospital. Once discharged, he received Home Health PT and OT services for about a month. Pt. has had multiple CVAs over the past 8 years, and has had multiple falls in the past 6 months. Pt. resides in an apartment. Pt. has caregivers for 80 hours. Pt.'s mother stays with pt. at night, and assists with IADL tasks.      Patient Stated Goals  To be able to throw a ball and dribble a ball.    Currently in Pain?  Yes    Pain Score  5     Pain Location  Knee    Pain Orientation  Left    Pain Descriptors / Indicators  Aching    Pain Type  Chronic pain      OT TREATMENT  Self-care/Handwriting:  Pt completed a writing activity that required him to write his name in  print and in cursive form and to copy words from a sample and formulate a one-word written response. Pt wrote name in both forms, requiring increased time. Pt wrote name with ~70% legibility in print and ~60% legibility in cursive. Pt required increased time to write word from sample and demonstrated decreased legibility as writing speed increased. Pt required minimal assistance with spelling written responses and was able to independently and correctly formulate responses. Pt did not deviate from the line and margin while using unlined paper and a standard pen without a grip.  Therapeutic activity:  Pt completed various reaching activities that required him to reach in each plane while manipulating various sized objects. Pt was able to reach across midline using both arms. Pt demonstrated signs of increased exertion as he reached further away in each plane. Pt exerts less effort when reaching with LUE than when using RUE. As treatment progress, pt required increased rest breaks between tasks.                   OT Education - 12/25/17 1620    Education provided  Yes    Education Details  UE ROM, coordination, and handwriting    Person(s) Educated  Patient    Methods  Explanation;Demonstration    Comprehension  Verbalized understanding;Verbal cues required       OT Short Term Goals - 03/03/17 1459      OT SHORT TERM GOAL #1   Title  `        OT Long Term Goals - 11/20/17 1844      OT LONG TERM GOAL #1   Title  Pt. will UE functioning to be able to perfrom toilet hygiene with minimal assistance.    Baseline  Pt. conintues to require assist    Time  12    Period  Weeks    Status  On-going    Target Date  01/14/18      OT LONG TERM GOAL #2   Title  Pt. will complete self grooming with minA    Baseline  Pt. is improving indedependent with oral care with set-up, however requires extensive assist for self-grooming tasls.    Time  12    Period  Weeks    Status  On-going     Target Date  01/14/18      OT LONG TERM GOAL #3   Title  Pt. will perform self-feeding skills with modified indepndence    Baseline   Pt. is able to sccop, and spear the food off of the plate, however requires assist for the hand to mouth pattern. Pt. no longer needs to use built-up foam handle for utensils.    Time  12    Period  Weeks    Status  On-going    Target Date  01/14/18      OT LONG TERM GOAL #4   Title  Pt. will perform self dressing with minA and A/E as needed.    Baseline  Pt. continues to require extensive assist: Pt. continues to have difficulty perfroming UE,a nd LE dressing.    Time  12    Period  Weeks    Status  On-going    Target Date  01/14/18      OT LONG TERM GOAL #5   Title  Pt. will perform light home making tasks with minA    Baseline  Pt. continues to have difficulty with folding laundry, washing dishes while standing at the sinkside. Pt. is able to make a sandwich, and heat things up in th microwave.    Time  12    Period  Weeks    Status  On-going    Target Date  01/14/18      OT LONG TERM GOAL #6   Title  Pt. will write his name efficiently with 100% legibility    Baseline   60% legibility for name.    Time  12    Period  Weeks    Status  On-going    Target Date  01/14/18            Plan - 12/25/17 1623    Clinical Impression Statement  Pt is acclimating well to new apartments. Pt. Is having diffculty reaching for bed rails while rolling in bed at home. Pt. Has been trying to reach for the rails, and pull himself over in bed to assist caregivers. Pt. could benefit from working on ROM to improve reaching, and pain. Pt presents with limited UE strength and [place] and continues to work to improve in order to increase functional use during ADLs and IADLs    Occupational performance deficits (  Please refer to evaluation for details):  ADL's;IADL's    Rehab Potential  Fair    OT Frequency  2x / week    OT Duration  12 weeks    OT  Treatment/Interventions  Self-care/ADL training;Patient/family education;DME and/or AE instruction;Therapeutic exercise;Moist Heat;Neuromuscular education    Clinical Decision Making  Several treatment options, min-mod task modification necessary    Consulted and Agree with Plan of Care  Patient       Patient will benefit from skilled therapeutic intervention in order to improve the following deficits and impairments:  Pain, Decreased cognition, Decreased activity tolerance, Impaired UE functional use, Decreased range of motion, Decreased coordination, Decreased endurance  Visit Diagnosis: Muscle weakness (generalized)  Other lack of coordination    Problem List Patient Active Problem List   Diagnosis Date Noted  . Acute CVA (cerebrovascular accident) ([place]) 05/02/2016  . Left-sided weakness 05/01/2016    Oliver Hum, OTS 12/25/2017, 4:31 PM   This entire session was performed under direct supervision and direction of a licensed therapist/therapist assistant . I have personally read, edited and approve of the note as written.  Harrel Carina, MS, OTR/L   [place] MAIN [facility] SERVICES [address] [place], [place], [address] Phone: [phone]   Fax:  [phone]  Name: Curtis Powell MRN: [id number] Date of Birth: 30-Oct-1965

## 2017-12-30 ENCOUNTER — Ambulatory Visit: Payer: Medicare HMO | Admitting: Occupational Therapy

## 2017-12-30 ENCOUNTER — Ambulatory Visit: Payer: Medicare HMO

## 2017-12-30 ENCOUNTER — Encounter: Payer: Self-pay | Admitting: Occupational Therapy

## 2017-12-30 DIAGNOSIS — R278 Other lack of coordination: Secondary | ICD-10-CM

## 2017-12-30 DIAGNOSIS — M6281 Muscle weakness (generalized): Secondary | ICD-10-CM | POA: Diagnosis not present

## 2017-12-30 DIAGNOSIS — R2689 Other abnormalities of gait and mobility: Secondary | ICD-10-CM

## 2017-12-30 NOTE — Therapy (Addendum)
[place] MAIN [facility] SERVICES [address] [place], [place], [address] Phone: [phone]   Fax:  [phone]  Physical Therapy Treatment  Patient Details  Name: Curtis Powell MRN: [id number] Date of Birth: 1965-10-22 Referring Provider: Dr. Larinda Buttery   Encounter Date: 12/30/2017  PT End of Session - 12/30/17 1554    Visit Number  42    Number of Visits  108    Date for PT Re-Evaluation  02/12/18    Authorization Type  7/10 ( PN start 8/22)     PT Start Time  1516    PT Stop Time  1600    PT Time Calculation (min)  44 min    Equipment Utilized During Treatment  Gait belt    Activity Tolerance  Patient tolerated treatment well    Behavior During Therapy  [facility] for tasks assessed/performed       Past Medical History:  Diagnosis Date  . GERD (gastroesophageal reflux disease)   . Hyperlipidemia   . Hypertension   . Obesity   . Stroke [facility])     Past Surgical History:  Procedure Laterality Date  . BRAIN SURGERY      There were no vitals filed for this visit.  Subjective Assessment - 12/30/17 1537    Subjective  Patient states he is doing well and he has not had any more knee pain since last session. Reports he has been doing his HEP.     Pertinent History   Patient is a pleasant 52 year old male who presents to physical therapy for weakness and immobility secondary to CVA.  Had a stroke in Feb 2018. He was previously fully independent, but this stroke caused severe residual deficits, mainly on the right side as well as speech, and now he is unable to walk or perform most of his ADLs on his own. Entire right side is very weak. He still has a little difficulty with speech but his swallowing is improved to baseline. His mom had to move in with him and now is his main caregiver.     Limitations  Sitting;Lifting;Standing;Walking;House hold activities    How long can you sit comfortably?  5 minutes    How long can you stand  comfortably?  5 minutes    How long can you walk comfortably?  10 ft    Patient Stated Goals  Walk without walker, walk further with walker, get some strength in back     Currently in Pain?  No/denies    Pain Onset  Yesterday       Gait/mobility:    Ambulate in hallway with RW 100  feet and CGA  with WC follow, Verbal cues for weight shift onto RLE for L foot clearance.  Frequent cueing via verbal and/or tactile for upright posture and increased step length demonstrating carry over from cueing for approximately 5 steps. Tactile cues for gluteal activation to improve step length. 1 seated rest break. 1 episode of R knee buckling. Patient has decreased velocity resulting in prolonged timed needed for distance ambulation.      Seated   LAQ BLE 2x10 with 3 second holds 5lbs ankle weights  Abuduction with RTB 2x10 3 second holds  Hip flex BLE 2x 10  With 3 second holds 5lbs ankle weights  Adduction ball squeezes x10 with 3 second holds  Gluteal squeezes x15 5 second holds  PT Education - 12/30/17 1539    Education provided  Yes    Education Details  exercise technique, HEP compliance     Person(s) Educated  Patient    Methods  Explanation;Tactile cues;Demonstration    Comprehension  Verbalized understanding;Returned demonstration       PT Short Term Goals - 12/18/17 1500      PT SHORT TERM GOAL #1   Title  Patient will be independent in home exercise program to improve strength/mobility for better functional independence with ADLs.    Baseline  hep compliant    Time  2    Period  Weeks    Status  Achieved      PT SHORT TERM GOAL #2   Title  Patient will require min cueing for STS transfer with CGA for increased independence with mobility.     Baseline  CGA    Time  2    Period  Weeks    Status  Achieved      PT SHORT TERM GOAL #3   Title  Patient will maintain upright posture for > 15 seconds to demonstrate strengthened postural  control muscualture     Baseline  18 seconds     Time  2    Period  Weeks    Status  Achieved        PT Long Term Goals - 12/18/17 1407      PT LONG TERM GOAL #1   Title  Patient will perform bed mobility with Supervision to increase independent mobility    Baseline  able to perform mod I for increased time , occasional Min A for scooting up in bed; 7/16: min guard for safety with increased effort and time, cues to scoot back in bed before sit>supine 9/12: can do it independently but with difficulty    Time  8    Period  Weeks    Status  Achieved      PT LONG TERM GOAL #2   Title  Patient (< 36 years old) will complete five times sit to stand test in < 10 seconds indicating an increased LE strength and improved balance.    Baseline  2/18: 30 seconds from plinth chair height single UE on seat and single on walker ; 32 seconds from plinth with single UE on seat and single on walker. 4/10: 26 seconds from [facility] with one hand on walker5/29: 22.2 seconds with one hand on walker one hand on WC; 7/16: one hand on walker one hand on WC 18.96 seconds; pt achieving 75% of full upright standing 8/22:one hand on walker one hand on WC 18 seconds; pt achieving 75% of full upright standing  9/12: 18 seconds econds; pt achieving 75% of full upright standing 8/22:one hand on walker one hand on WC    Time  8    Period  Weeks    Status  Partially Met      PT LONG TERM GOAL #3   Title  Patient will ambulate 40 ft with walker with no breaks to allow for increased mobility within home.     Baseline  12/26: 29 ft with walker and WC follow with no rest breaks; 2/18: multiple trials previous session of >100 ft     Time  8    Period  Weeks    Status  Achieved      PT LONG TERM GOAL #4   Title  Patient will increase BLE gross strength to 4+/5 as to improve functional  strength for independent gait, increased standing tolerance and increased ADL ability.    Baseline   4-/5 RLE,     Time  8    Period  Weeks     Status  Partially Met    Target Date  02/12/18      PT LONG TERM GOAL #5   Title  Patient will increase lower extremity functional scale to >40/80 to demonstrate improved functional mobility and increased tolerance with ADLs.     Baseline  14/80; 12/26: 21/80; 2/18: 28/80  4/10; 25/80 5/29: 27/80; 7/16: 29/80 8/22: 30/80  9/12: 26/80 (feeling more tired due to moving)    Time  8    Period  Weeks    Status  Partially Met    Target Date  02/12/18      Additional Long Term Goals   Additional Long Term Goals  Yes      PT LONG TERM GOAL #6   Title  Patient will walk through a door involving one small step up/down to increase functional independence.     Baseline  require CGA  BUE support in // bars at this time    Time  8    Period  Weeks    Status  Partially Met    Target Date  02/12/18      PT LONG TERM GOAL #7   Title  Patient will ambulate 40 ft with least assistive device and Supervision with no breaks to allow for increased mobility within home.     Baseline  requires use of walker and CGA     Time  8    Period  Weeks    Status  Partially Met    Target Date  02/12/18      PT LONG TERM GOAL #8   Title  Patient will perform 10 MWT in < 1 minute to demonstrate improved ambulatory velocity and capacity.     Baseline  4/10: 2 minutes 45 seconds; 5/29: 2 minute 31 seconds; 3 minutes 22 seconds 8/22: 3 minutes 2 seconds  9/12:  2 minutes and 43 seconds     Time  8    Period  Weeks    Status  Partially Met    Target Date  02/12/18      PT LONG TERM GOAL  #9   TITLE  Patient will increase Berg Balance score by > 6 points (26/56) to demonstrate decreased fall risk during functional activities.    Baseline  9/12: 20/56    Time  8    Period  Weeks    Status  New    Target Date  02/12/18            Plan - 12/30/17 1558    Clinical Impression Statement  Patient had improved step length and less cueing required for adequate weight shift for foot clearance during ambulation  demonstrating improvements in functional mobility.Patient still challenged with endurance task of ambulation with fatigue noted at end of walking distance therefore rest of session focused on seated exercises. Patient will continue to benefit from skilled physical therapy services to improve strength, balance, and gait safety.     Clinical Impairments Affecting Rehab Potential  hx of HTN, HLD, CVA, learning disability, Diabetes, brain tumor,     PT Frequency  2x / week    PT Duration  8 weeks    PT Treatment/Interventions  ADLs/Self Care Home Management;Aquatic Therapy;Ultrasound;Moist Heat;Traction;DME Instruction;Gait training;Stair training;Functional mobility training;Therapeutic activities;Therapeutic exercise;Orthotic Fit/Training;Neuromuscular re-education;Balance training;Patient/family  education;Manual techniques;Wheelchair mobility training;Passive range of motion;Energy conservation;Taping;Visual/perceptual remediation/compensation    PT Next Visit Plan  balance and stability and ambulation      PT Home Exercise Plan  TrA contraction     Consulted and Agree with Plan of Care  Patient       Patient will benefit from skilled therapeutic intervention in order to improve the following deficits and impairments:  Abnormal gait, Decreased activity tolerance, Decreased balance, Decreased knowledge of precautions, Decreased endurance, Decreased coordination, Decreased knowledge of use of DME, Decreased mobility, Decreased range of motion, Difficulty walking, Decreased safety awareness, Decreased strength, Impaired flexibility, Impaired perceived functional ability, Impaired tone, Postural dysfunction, Improper body mechanics, Pain  Visit Diagnosis: Other lack of coordination  Muscle weakness (generalized)  Other abnormalities of gait and mobility     Problem List Patient Active Problem List   Diagnosis Date Noted  . Acute CVA (cerebrovascular accident) ([place]) 05/02/2016  . Left-sided  weakness 05/01/2016   Erick Blinks, SPT  This entire session was performed under direct supervision and direction of a licensed therapist/therapist assistant . I have personally read, edited and approve of the note as written.  Janna Arch, PT, DPT   12/31/2017, 7:47 AM  [place] MAIN [facility] SERVICES [address] [place], [place], [address] Phone: [phone]   Fax:  [phone]  Name: Curtis Powell MRN: [id number] Date of Birth: 09/01/1965

## 2017-12-30 NOTE — Therapy (Addendum)
[place] MAIN [facility] SERVICES [address] [place], [place], [address] Phone: [phone]   Fax:  [phone]  Occupational Therapy Treatment  Patient Details  Name: Curtis Powell MRN: [id number] Date of Birth: 11-Jan-1966 Referring Provider: Dr. Larinda Buttery   Encounter Date: 12/30/2017  OT End of Session - 12/30/17 1459    Visit Number  38    Number of Visits  93    Date for OT Re-Evaluation  01/14/18    Authorization Type  Visit 6 of 10 for progress reporting period starting 10/07/2017    OT Start Time  1430    OT Stop Time  1515    OT Time Calculation (min)  45 min    Equipment Utilized During Treatment  Sock aid       Past Medical History:  Diagnosis Date  . GERD (gastroesophageal reflux disease)   . Hyperlipidemia   . Hypertension   . Obesity   . Stroke [facility])     Past Surgical History:  Procedure Laterality Date  . BRAIN SURGERY      There were no vitals filed for this visit.  Subjective Assessment - 12/30/17 1447    Subjective   Pt reports he is doing well today.    Pertinent History  Pt. is a 52 y.o. male who suffered a CVA on 05/01/2016. Pt. was admitted to the hospital. Once discharged, he received Home Health PT and OT services for about a month. Pt. has had multiple CVAs over the past 8 years, and has had multiple falls in the past 6 months. Pt. resides in an apartment. Pt. has caregivers for 80 hours. Pt.'s mother stays with pt. at night, and assists with IADL tasks.      Patient Stated Goals  To be able to throw a ball and dribble a ball.    Currently in Pain?  No/denies    Pain Score  0-No pain      OT TREATMENT  Self-care:  Pt completed self-feeding tasks that required him to eat ice cream from a small styrofoam cup while using a standard steel spoon. Pt was able to maintain a functional grasp on the spoon using his R hand. Pt used his L hand to hold and rotate the cup to properly position it to  scoop from. Ice cream was firm and moderately difficult to scoop. Pt. Had one episode of spillage while eating ice-cream. Pt then completed task that required him to spread peanut butter on a small graham cracker using a standard steel butter knife. Pt demonstrated fair coordination, as he was able to scoop peanut butter from small container using R hand and spread it on graham cracker with ~85% accuracy. Pt spread a small amount of peanut butter on his finger while attempting to get it on the graham cracker. Pt completed LB dressing including doffing and donning shoes and socks. Pt was able to doff shoes with increased time. Pt demonstrated increased signs of exertion while untying shoes while seated in w/c. Pt reported that he typically completes LB dressing while seated EOB. Pt was able to use opposite foot to remove each shoe without having to bend forward in w/c. Pt demonstrated increased signs of exertion when doffing socks and required increased time. Pt was instructed through the use of a sock aid to don socks. Pt required tactile and verbal cuing for use of sock aid. Pt reported that caregiver typically dons socks for him.  OT Education - 12/30/17 1457    Education provided  Yes    Education Details  Self-feeding & AE for LB dressing    Person(s) Educated  Patient    Methods  Explanation;Verbal cues;Demonstration    Comprehension  Verbalized understanding;Verbal cues required       OT Short Term Goals - 03/03/17 1459      OT SHORT TERM GOAL #1   Title  `        OT Long Term Goals - 11/20/17 1844      OT LONG TERM GOAL #1   Title  Pt. will UE functioning to be able to perfrom toilet hygiene with minimal assistance.    Baseline  Pt. conintues to require assist    Time  12    Period  Weeks    Status  On-going    Target Date  01/14/18      OT LONG TERM GOAL #2   Title  Pt. will complete self grooming with minA    Baseline  Pt. is improving  indedependent with oral care with set-up, however requires extensive assist for self-grooming tasls.    Time  12    Period  Weeks    Status  On-going    Target Date  01/14/18      OT LONG TERM GOAL #3   Title  Pt. will perform self-feeding skills with modified indepndence    Baseline   Pt. is able to sccop, and spear the food off of the plate, however requires assist for the hand to mouth pattern. Pt. no longer needs to use built-up foam handle for utensils.    Time  12    Period  Weeks    Status  On-going    Target Date  01/14/18      OT LONG TERM GOAL #4   Title  Pt. will perform self dressing with minA and A/E as needed.    Baseline  Pt. continues to require extensive assist: Pt. continues to have difficulty perfroming UE,a nd LE dressing.    Time  12    Period  Weeks    Status  On-going    Target Date  01/14/18      OT LONG TERM GOAL #5   Title  Pt. will perform light home making tasks with minA    Baseline  Pt. continues to have difficulty with folding laundry, washing dishes while standing at the sinkside. Pt. is able to make a sandwich, and heat things up in th microwave.    Time  12    Period  Weeks    Status  On-going    Target Date  01/14/18      OT LONG TERM GOAL #6   Title  Pt. will write his name efficiently with 100% legibility    Baseline   60% legibility for name.    Time  12    Period  Weeks    Status  On-going    Target Date  01/14/18            Plan - 12/30/17 1523    Clinical Impression Statement  Pt reports enjoying living in new apartment. Pt has a w/c accessible sink and is able to wash dishes while seated in w/c. Pt reports having diffculties spearing and scooping some foods during self-feeding. Pt uses built-up foam handles on utensils at home. Pt is making progres towards independence and reducing spillage during self-feeding using standard utensils. Pt requires increased assistance  for LB dressing. Pt is making progress using AE to don socks  and preparing to don shoes.    Occupational performance deficits (Please refer to evaluation for details):  ADL's;IADL's    Rehab Potential  Fair    OT Frequency  2x / week    OT Duration  12 weeks    OT Treatment/Interventions  Self-care/ADL training;Patient/family education;DME and/or AE instruction;Therapeutic exercise;Moist Heat;Neuromuscular education    Clinical Decision Making  Several treatment options, min-mod task modification necessary    Consulted and Agree with Plan of Care  Patient       Patient will benefit from skilled therapeutic intervention in order to improve the following deficits and impairments:  Pain, Decreased cognition, Decreased activity tolerance, Impaired UE functional use, Decreased range of motion, Decreased coordination, Decreased endurance  Visit Diagnosis: Other lack of coordination  Muscle weakness (generalized)    Problem List Patient Active Problem List   Diagnosis Date Noted  . Acute CVA (cerebrovascular accident) ([place]) 05/02/2016  . Left-sided weakness 05/01/2016    Oliver Hum, OTS 12/30/2017, 3:28 PM   This entire session was performed under direct supervision and direction of a licensed therapist/therapist assistant . I have personally read, edited and approve of the note as written.  Harrel Carina, MS, OTR/L   [place] MAIN [facility] SERVICES [address] [place], [place], [address] Phone: [phone]   Fax:  [phone]  Name: Curtis Powell MRN: [id number] Date of Birth: Jul 02, 1965

## 2018-01-01 ENCOUNTER — Ambulatory Visit: Payer: Medicare HMO | Admitting: Occupational Therapy

## 2018-01-01 ENCOUNTER — Ambulatory Visit: Payer: Medicare HMO

## 2018-01-07 ENCOUNTER — Ambulatory Visit: Payer: Medicare HMO | Attending: Family Medicine

## 2018-01-07 ENCOUNTER — Encounter: Payer: Self-pay | Admitting: Occupational Therapy

## 2018-01-07 ENCOUNTER — Ambulatory Visit: Payer: Medicare HMO | Admitting: Occupational Therapy

## 2018-01-07 DIAGNOSIS — M6281 Muscle weakness (generalized): Secondary | ICD-10-CM

## 2018-01-07 DIAGNOSIS — R2689 Other abnormalities of gait and mobility: Secondary | ICD-10-CM | POA: Insufficient documentation

## 2018-01-07 DIAGNOSIS — I69351 Hemiplegia and hemiparesis following cerebral infarction affecting right dominant side: Secondary | ICD-10-CM | POA: Diagnosis present

## 2018-01-07 DIAGNOSIS — R278 Other lack of coordination: Secondary | ICD-10-CM | POA: Diagnosis present

## 2018-01-07 NOTE — Therapy (Addendum)
[place] MAIN [facility] SERVICES [address] [place], [place], [address] Phone: [phone]   Fax:  [phone]  Occupational Therapy Treatment  Patient Details  Name: Curtis Powell MRN: [id number] Date of Birth: 12/25/1965 Referring Provider (OT): Dr. Bobette Mo   Encounter Date: 01/07/2018  OT End of Session - 01/07/18 1619    Visit Number  68    Number of Visits  44    Date for OT Re-Evaluation  01/14/18    Authorization Type  Visit 7 of 10 for progress reporting period starting 10/07/2017    OT Start Time  1515    OT Stop Time  1600    OT Time Calculation (min)  45 min    Equipment Utilized During Treatment  Long handled shoe horn    Activity Tolerance  Patient tolerated treatment well    Behavior During Therapy  [facility] for tasks assessed/performed       Past Medical History:  Diagnosis Date  . GERD (gastroesophageal reflux disease)   . Hyperlipidemia   . Hypertension   . Obesity   . Stroke [facility])     Past Surgical History:  Procedure Laterality Date  . BRAIN SURGERY      There were no vitals filed for this visit.  Subjective Assessment - 01/07/18 1617    Subjective   Pt reports he is doing well and has been able to incorporate therband UE exercises into his daily routine.    Pertinent History  Pt. is a 52 y.o. male who suffered a CVA on 05/01/2016. Pt. was admitted to the hospital. Once discharged, he received Home Health PT and OT services for about a month. Pt. has had multiple CVAs over the past 8 years, and has had multiple falls in the past 6 months. Pt. resides in an apartment. Pt. has caregivers for 80 hours. Pt.'s mother stays with pt. at night, and assists with IADL tasks.      Patient Stated Goals  To be able to throw a ball and dribble a ball.    Currently in Pain?  No/denies    Pain Score  0-No pain       OT TREATMENT  Self-care:  Pt completed self-care activity that required him to doff and don shoes using long  handled shoe horn. Pt was able to doff shoes by using the opposite foot to slip them off. Pt required increased time and tactile cues to use shoe horn. Pt demonstrated decreased ROM and strength when using shoe horn for the R shoe. Pt currently prefers to don shoes how he has typically done so. Pt. required CGA for toileting skills standing at the toilet with the grab bar to the left.pt. Required Supervision with set-up for hand hygiene care.    Therapeutic exercise:  Pt completed shoulder exercises including shoulder press, chest press, and large shoulder rotations for 2 sets of 10 reps using a 2.5# weighted dowel. Pt required demonstration for proper form and technique. Pt demonstrated increased exertion as exercises progressed. Pt completed reps with slow and deliberate movements.                    OT Education - 01/07/18 1618    Education Details  AE for LB dressing & UE strengthening     Person(s) Educated  Patient    Methods  Explanation;Verbal cues;Demonstration    Comprehension  Verbalized understanding;Verbal cues required       OT Short Term Goals -  03/03/17 1459      OT SHORT TERM GOAL #1   Title  `        OT Long Term Goals - 11/20/17 1844      OT LONG TERM GOAL #1   Title  Pt. will UE functioning to be able to perfrom toilet hygiene with minimal assistance.    Baseline  Pt. conintues to require assist    Time  12    Period  Weeks    Status  On-going    Target Date  01/14/18      OT LONG TERM GOAL #2   Title  Pt. will complete self grooming with minA    Baseline  Pt. is improving indedependent with oral care with set-up, however requires extensive assist for self-grooming tasls.    Time  12    Period  Weeks    Status  On-going    Target Date  01/14/18      OT LONG TERM GOAL #3   Title  Pt. will perform self-feeding skills with modified indepndence    Baseline   Pt. is able to sccop, and spear the food off of the plate, however requires assist  for the hand to mouth pattern. Pt. no longer needs to use built-up foam handle for utensils.    Time  12    Period  Weeks    Status  On-going    Target Date  01/14/18      OT LONG TERM GOAL #4   Title  Pt. will perform self dressing with minA and A/E as needed.    Baseline  Pt. continues to require extensive assist: Pt. continues to have difficulty perfroming UE,a nd LE dressing.    Time  12    Period  Weeks    Status  On-going    Target Date  01/14/18      OT LONG TERM GOAL #5   Title  Pt. will perform light home making tasks with minA    Baseline  Pt. continues to have difficulty with folding laundry, washing dishes while standing at the sinkside. Pt. is able to make a sandwich, and heat things up in th microwave.    Time  12    Period  Weeks    Status  On-going    Target Date  01/14/18      OT LONG TERM GOAL #6   Title  Pt. will write his name efficiently with 100% legibility    Baseline   60% legibility for name.    Time  12    Period  Weeks    Status  On-going    Target Date  01/14/18            Plan - 01/07/18 1620    Clinical Impression Statement  Pt reports requiring increased time and occasional assistance for donning shoes. Pt worked on using long Veterinary surgeon to don shoes. Pt reports recently incorporating UE theraband exercises into his daily routine. Pt reports pain of a 0/10 at rest today and completed UE exercises without pain    Occupational performance deficits (Please refer to evaluation for details):  ADL's;IADL's    Rehab Potential  Fair    OT Frequency  2x / week    OT Duration  12 weeks    OT Treatment/Interventions  Self-care/ADL training;Patient/family education;DME and/or AE instruction;Therapeutic exercise;Moist Heat;Neuromuscular education    Clinical Decision Making  Several treatment options, min-mod task modification necessary    Consulted  and Agree with Plan of Care  Patient       Patient will benefit from skilled therapeutic  intervention in order to improve the following deficits and impairments:  Pain, Decreased cognition, Decreased activity tolerance, Impaired UE functional use, Decreased range of motion, Decreased coordination, Decreased endurance  Visit Diagnosis: Muscle weakness (generalized)  Other lack of coordination    Problem List Patient Active Problem List   Diagnosis Date Noted  . Acute CVA (cerebrovascular accident) ([place]) 05/02/2016  . Left-sided weakness 05/01/2016    Oliver Hum, OTS 01/07/2018, 4:24 PM   This entire session was performed under direct supervision and direction of a licensed therapist/therapist assistant . I have personally read, edited and approve of the note as written.  Harrel Carina, MS, OTR/L   [place] MAIN [facility] SERVICES [address] [place], [place], [address] Phone: [phone]   Fax:  [phone]  Name: Curtis Powell MRN: [id number] Date of Birth: 12-19-1965

## 2018-01-07 NOTE — Therapy (Addendum)
[place] MAIN [facility] SERVICES [address] [place], [place], [address] Phone: [phone]   Fax:  [phone]  Physical Therapy Treatment  Patient Details  Name: Curtis Powell MRN: [id number] Date of Birth: 10-04-1965 Referring Provider (PT): Dr. Arrie Aran Santaya   Encounter Date: 01/07/2018  PT End of Session - 01/07/18 1433    Visit Number  50    Number of Visits  83    Date for PT Re-Evaluation  02/12/18    Authorization Type  8/10 ( PN start 8/22)     PT Start Time  1429    PT Stop Time  1515    PT Time Calculation (min)  46 min    Equipment Utilized During Treatment  Gait belt    Activity Tolerance  Patient tolerated treatment well    Behavior During Therapy  [facility] for tasks assessed/performed       Past Medical History:  Diagnosis Date  . GERD (gastroesophageal reflux disease)   . Hyperlipidemia   . Hypertension   . Obesity   . Stroke [facility])     Past Surgical History:  Procedure Laterality Date  . BRAIN SURGERY      There were no vitals filed for this visit.  Subjective Assessment - 01/07/18 1431    Subjective  Patient states he is doing well today and that he walked this morning. Reports no pain and HEP compliance. No falls since last session.    Pertinent History   Patient is a pleasant 52 year old male who presents to physical therapy for weakness and immobility secondary to CVA.  Had a stroke in Feb 2018. He was previously fully independent, but this stroke caused severe residual deficits, mainly on the right side as well as speech, and now he is unable to walk or perform most of his ADLs on his own. Entire right side is very weak. He still has a little difficulty with speech but his swallowing is improved to baseline. His mom had to move in with him and now is his main caregiver.     Limitations  Sitting;Lifting;Standing;Walking;House hold activities    How long can you sit comfortably?  5 minutes    How long can  you stand comfortably?  5 minutes    How long can you walk comfortably?  10 ft    Patient Stated Goals  Walk without walker, walk further with walker, get some strength in back     Currently in Pain?  No/denies    Pain Onset  Yesterday       Gait/mobility:    Ambulate outside on uneven surfaces with turns with RW and CGA  with WC follow, Verbal cues for weight shift onto RLE for L foot clearance.  Frequent cueing via verbal and/or tactile for upright posture and increased step length. Tactile cues for gluteal activation to improve step length and RLE stance time. 1 seated rest break. Patient has decreased velocity resulting in prolonged timed needed for distance ambulation. Total time ambulating 21mnutes with seated rest break    Seated  LAQ BLE x15 with 3 second holds 5lbs ankle weights   Abduction with RTB x10 3 second holds   Hip flex BLE 2x 10. With 3 second holds 5lbs ankle weights   Adduction ball squeezes x10 with 3 second holds   Gluteal squeezes x15 3 second holds  Bathroom mobility performed at beginning of session: addressed standing balance and mobility  PT Education - 01/07/18 1432    Education provided  Yes    Education Details  exercise technique, HEP     Person(s) Educated  Patient    Methods  Explanation;Demonstration;Verbal cues    Comprehension  Verbalized understanding;Returned demonstration       PT Short Term Goals - 12/18/17 1500      PT SHORT TERM GOAL #1   Title  Patient will be independent in home exercise program to improve strength/mobility for better functional independence with ADLs.    Baseline  hep compliant    Time  2    Period  Weeks    Status  Achieved      PT SHORT TERM GOAL #2   Title  Patient will require min cueing for STS transfer with CGA for increased independence with mobility.     Baseline  CGA    Time  2    Period  Weeks    Status  Achieved      PT SHORT TERM GOAL #3    Title  Patient will maintain upright posture for > 15 seconds to demonstrate strengthened postural control muscualture     Baseline  18 seconds     Time  2    Period  Weeks    Status  Achieved        PT Long Term Goals - 12/18/17 1407      PT LONG TERM GOAL #1   Title  Patient will perform bed mobility with Supervision to increase independent mobility    Baseline  able to perform mod I for increased time , occasional Min A for scooting up in bed; 7/16: min guard for safety with increased effort and time, cues to scoot back in bed before sit>supine 9/12: can do it independently but with difficulty    Time  8    Period  Weeks    Status  Achieved      PT LONG TERM GOAL #2   Title  Patient (< 1 years old) will complete five times sit to stand test in < 10 seconds indicating an increased LE strength and improved balance.    Baseline  2/18: 30 seconds from plinth chair height single UE on seat and single on walker ; 32 seconds from plinth with single UE on seat and single on walker. 4/10: 26 seconds from [facility] with one hand on walker5/29: 22.2 seconds with one hand on walker one hand on WC; 7/16: one hand on walker one hand on WC 18.96 seconds; pt achieving 75% of full upright standing 8/22:one hand on walker one hand on WC 18 seconds; pt achieving 75% of full upright standing  9/12: 18 seconds econds; pt achieving 75% of full upright standing 8/22:one hand on walker one hand on WC    Time  8    Period  Weeks    Status  Partially Met      PT LONG TERM GOAL #3   Title  Patient will ambulate 40 ft with walker with no breaks to allow for increased mobility within home.     Baseline  12/26: 29 ft with walker and WC follow with no rest breaks; 2/18: multiple trials previous session of >100 ft     Time  8    Period  Weeks    Status  Achieved      PT LONG TERM GOAL #4   Title  Patient will increase BLE gross strength to 4+/5 as to improve functional strength  for independent gait, increased standing  tolerance and increased ADL ability.    Baseline   4-/5 RLE,     Time  8    Period  Weeks    Status  Partially Met    Target Date  02/12/18      PT LONG TERM GOAL #5   Title  Patient will increase lower extremity functional scale to >40/80 to demonstrate improved functional mobility and increased tolerance with ADLs.     Baseline  14/80; 12/26: 21/80; 2/18: 28/80  4/10; 25/80 5/29: 27/80; 7/16: 29/80 8/22: 30/80  9/12: 26/80 (feeling more tired due to moving)    Time  8    Period  Weeks    Status  Partially Met    Target Date  02/12/18      Additional Long Term Goals   Additional Long Term Goals  Yes      PT LONG TERM GOAL #6   Title  Patient will walk through a door involving one small step up/down to increase functional independence.     Baseline  require CGA  BUE support in // bars at this time    Time  8    Period  Weeks    Status  Partially Met    Target Date  02/12/18      PT LONG TERM GOAL #7   Title  Patient will ambulate 40 ft with least assistive device and Supervision with no breaks to allow for increased mobility within home.     Baseline  requires use of walker and CGA     Time  8    Period  Weeks    Status  Partially Met    Target Date  02/12/18      PT LONG TERM GOAL #8   Title  Patient will perform 10 MWT in < 1 minute to demonstrate improved ambulatory velocity and capacity.     Baseline  4/10: 2 minutes 45 seconds; 5/29: 2 minute 31 seconds; 3 minutes 22 seconds 8/22: 3 minutes 2 seconds  9/12:  2 minutes and 43 seconds     Time  8    Period  Weeks    Status  Partially Met    Target Date  02/12/18      PT LONG TERM GOAL  #9   TITLE  Patient will increase Berg Balance score by > 6 points (26/56) to demonstrate decreased fall risk during functional activities.    Baseline  9/12: 20/56    Time  8    Period  Weeks    Status  New    Target Date  02/12/18            Plan - 01/07/18 1521    Clinical Impression Statement  Patient ambulated outside  on uneven surfaces navigating turns which increases difficulty for foot clearance and step length. Patient required cueing for adequate weight shift and LLE foot clearance as well as verbal and tactile cueing for upright posture and gluteal activation to increase ease of ambulating especially on inclines. Patient had mild fatigue following ambulating therefore rest of session completed strengthening exercises in sitting. Patient will continue to benefit from skilled physical therapy to improve strength, balance, and functional mobility.    Clinical Impairments Affecting Rehab Potential  hx of HTN, HLD, CVA, learning disability, Diabetes, brain tumor,     PT Frequency  2x / week    PT Duration  8 weeks    PT Treatment/Interventions  ADLs/Self  Care Home Management;Aquatic Therapy;Ultrasound;Moist Heat;Traction;DME Instruction;Gait training;Stair training;Functional mobility training;Therapeutic activities;Therapeutic exercise;Orthotic Fit/Training;Neuromuscular re-education;Balance training;Patient/family education;Manual techniques;Wheelchair mobility training;Passive range of motion;Energy conservation;Taping;Visual/perceptual remediation/compensation    PT Next Visit Plan  balance and stability and ambulation      PT Home Exercise Plan  TrA contraction     Consulted and Agree with Plan of Care  Patient       Patient will benefit from skilled therapeutic intervention in order to improve the following deficits and impairments:  Abnormal gait, Decreased activity tolerance, Decreased balance, Decreased knowledge of precautions, Decreased endurance, Decreased coordination, Decreased knowledge of use of DME, Decreased mobility, Decreased range of motion, Difficulty walking, Decreased safety awareness, Decreased strength, Impaired flexibility, Impaired perceived functional ability, Impaired tone, Postural dysfunction, Improper body mechanics, Pain  Visit Diagnosis: Other lack of coordination  Muscle  weakness (generalized)  Other abnormalities of gait and mobility     Problem List Patient Active Problem List   Diagnosis Date Noted  . Acute CVA (cerebrovascular accident) ([place]) 05/02/2016  . Left-sided weakness 05/01/2016   Erick Blinks, SPT  This entire session was performed under direct supervision and direction of a licensed therapist/therapist assistant . I have personally read, edited and approve of the note as written.  Janna Arch, PT, DPT   01/07/2018, 3:30 PM  [place] MAIN [facility] SERVICES [address] [place], [place], [address] Phone: [phone]   Fax:  [phone]  Name: Curtis Powell MRN: [id number] Date of Birth: [birth year]

## 2018-01-12 ENCOUNTER — Ambulatory Visit: Payer: Medicare HMO | Admitting: Occupational Therapy

## 2018-01-12 ENCOUNTER — Ambulatory Visit: Payer: Medicare HMO | Admitting: Physical Therapy

## 2018-01-14 ENCOUNTER — Ambulatory Visit: Payer: Medicare HMO | Admitting: Occupational Therapy

## 2018-01-14 ENCOUNTER — Encounter: Payer: Self-pay | Admitting: Occupational Therapy

## 2018-01-14 ENCOUNTER — Ambulatory Visit: Payer: Medicare HMO

## 2018-01-14 DIAGNOSIS — R278 Other lack of coordination: Secondary | ICD-10-CM | POA: Diagnosis not present

## 2018-01-14 DIAGNOSIS — M6281 Muscle weakness (generalized): Secondary | ICD-10-CM

## 2018-01-14 DIAGNOSIS — R2689 Other abnormalities of gait and mobility: Secondary | ICD-10-CM

## 2018-01-14 NOTE — Therapy (Addendum)
[place] MAIN [facility] SERVICES [address] [place], [place], [address] Phone: [phone]   Fax:  [phone]  Physical Therapy Treatment  Patient Details  Name: Curtis Powell MRN: [id number] Date of Birth: 02/04/1966 Referring Provider (PT): Dr. Arrie Aran Santaya   Encounter Date: 01/14/2018  PT End of Session - 01/14/18 1327    Visit Number  72    Number of Visits  17    Date for PT Re-Evaluation  02/12/18    Authorization Type  9/10 ( PN start 8/22)     PT Start Time  1400    PT Stop Time  1430    PT Time Calculation (min)  30 min    Equipment Utilized During Treatment  Gait belt    Activity Tolerance  Patient tolerated treatment well    Behavior During Therapy  [facility] for tasks assessed/performed       Past Medical History:  Diagnosis Date  . GERD (gastroesophageal reflux disease)   . Hyperlipidemia   . Hypertension   . Obesity   . Stroke [facility])     Past Surgical History:  Procedure Laterality Date  . BRAIN SURGERY      There were no vitals filed for this visit.  Subjective Assessment - 01/14/18 1328    Subjective  Patient arrived late to therapy session. Reports no pain or falls since last session.    Simultaneous filing. User may not have seen previous data.   Pertinent History   Patient is a pleasant 52 year old male who presents to physical therapy for weakness and immobility secondary to CVA.  Had a stroke in Feb 2018. He was previously fully independent, but this stroke caused severe residual deficits, mainly on the right side as well as speech, and now he is unable to walk or perform most of his ADLs on his own. Entire right side is very weak. He still has a little difficulty with speech but his swallowing is improved to baseline. His mom had to move in with him and now is his main caregiver.     Limitations  Sitting;Lifting;Standing;Walking;House hold activities    How long can you sit comfortably?  5 minutes     How long can you stand comfortably?  5 minutes    How long can you walk comfortably?  10 ft    Patient Stated Goals  Walk without walker, walk further with walker, get some strength in back     Currently in Pain?  No/denies   Simultaneous filing. User may not have seen previous data.   Pain Onset  --   Simultaneous filing. User may not have seen previous data.         Sit to stands from wheelchair to RW CGA with BUE use x10 with 10 second holds for upright posture. Verbal  And tactile cues for glute activation, pushing through LE, and upright posture.    In //bars:    Modified tandem stance with each leg shifting weight anteriorly to promote glute activation in front of mirror. Tactile cueing at ASIS and PSIS for appropriate anterior weight shift. x27mnutes CGA  Standing step over RTB for improved step length. Verbal cues for increased step length and lifting knee towards ceiling for proper gait mechanics. x10 each leg CGA  Seated toe taps onto airx pad x20 each leg. Verbal cues to increase speed of movement to increase velocity of movements for carryover to ambulatory mechanics     Bathroom  mobility performed at beginning of session: addressed standing balance and mobility                 PT Education - 01/14/18 1330    Education provided  Yes    Education Details  exercise technique, HEP    Person(s) Educated  Patient    Methods  Explanation;Demonstration;Verbal cues    Comprehension  Verbalized understanding;Returned demonstration       PT Short Term Goals - 12/18/17 1500      PT SHORT TERM GOAL #1   Title  Patient will be independent in home exercise program to improve strength/mobility for better functional independence with ADLs.    Baseline  hep compliant    Time  2    Period  Weeks    Status  Achieved      PT SHORT TERM GOAL #2   Title  Patient will require min cueing for STS transfer with CGA for increased independence with mobility.     Baseline   CGA    Time  2    Period  Weeks    Status  Achieved      PT SHORT TERM GOAL #3   Title  Patient will maintain upright posture for > 15 seconds to demonstrate strengthened postural control muscualture     Baseline  18 seconds     Time  2    Period  Weeks    Status  Achieved        PT Long Term Goals - 12/18/17 1407      PT LONG TERM GOAL #1   Title  Patient will perform bed mobility with Supervision to increase independent mobility    Baseline  able to perform mod I for increased time , occasional Min A for scooting up in bed; 7/16: min guard for safety with increased effort and time, cues to scoot back in bed before sit>supine 9/12: can do it independently but with difficulty    Time  8    Period  Weeks    Status  Achieved      PT LONG TERM GOAL #2   Title  Patient (< 78 years old) will complete five times sit to stand test in < 10 seconds indicating an increased LE strength and improved balance.    Baseline  2/18: 30 seconds from plinth chair height single UE on seat and single on walker ; 32 seconds from plinth with single UE on seat and single on walker. 4/10: 26 seconds from [facility] with one hand on walker5/29: 22.2 seconds with one hand on walker one hand on WC; 7/16: one hand on walker one hand on WC 18.96 seconds; pt achieving 75% of full upright standing 8/22:one hand on walker one hand on WC 18 seconds; pt achieving 75% of full upright standing  9/12: 18 seconds econds; pt achieving 75% of full upright standing 8/22:one hand on walker one hand on WC    Time  8    Period  Weeks    Status  Partially Met      PT LONG TERM GOAL #3   Title  Patient will ambulate 40 ft with walker with no breaks to allow for increased mobility within home.     Baseline  12/26: 29 ft with walker and WC follow with no rest breaks; 2/18: multiple trials previous session of >100 ft     Time  8    Period  Weeks    Status  Achieved  PT LONG TERM GOAL #4   Title  Patient will increase BLE gross  strength to 4+/5 as to improve functional strength for independent gait, increased standing tolerance and increased ADL ability.    Baseline   4-/5 RLE,     Time  8    Period  Weeks    Status  Partially Met    Target Date  02/12/18      PT LONG TERM GOAL #5   Title  Patient will increase lower extremity functional scale to >40/80 to demonstrate improved functional mobility and increased tolerance with ADLs.     Baseline  14/80; 12/26: 21/80; 2/18: 28/80  4/10; 25/80 5/29: 27/80; 7/16: 29/80 8/22: 30/80  9/12: 26/80 (feeling more tired due to moving)    Time  8    Period  Weeks    Status  Partially Met    Target Date  02/12/18      Additional Long Term Goals   Additional Long Term Goals  Yes      PT LONG TERM GOAL #6   Title  Patient will walk through a door involving one small step up/down to increase functional independence.     Baseline  require CGA  BUE support in // bars at this time    Time  8    Period  Weeks    Status  Partially Met    Target Date  02/12/18      PT LONG TERM GOAL #7   Title  Patient will ambulate 40 ft with least assistive device and Supervision with no breaks to allow for increased mobility within home.     Baseline  requires use of walker and CGA     Time  8    Period  Weeks    Status  Partially Met    Target Date  02/12/18      PT LONG TERM GOAL #8   Title  Patient will perform 10 MWT in < 1 minute to demonstrate improved ambulatory velocity and capacity.     Baseline  4/10: 2 minutes 45 seconds; 5/29: 2 minute 31 seconds; 3 minutes 22 seconds 8/22: 3 minutes 2 seconds  9/12:  2 minutes and 43 seconds     Time  8    Period  Weeks    Status  Partially Met    Target Date  02/12/18      PT LONG TERM GOAL  #9   TITLE  Patient will increase Berg Balance score by > 6 points (26/56) to demonstrate decreased fall risk during functional activities.    Baseline  9/12: 20/56    Time  8    Period  Weeks    Status  New    Target Date  02/12/18             Plan - 01/14/18 1434    Clinical Impression Statement   Patient session was shortened due to patient arriving late. Patient challenged with sit to stands with requried utilization of UE due to LE weakness. Patient able to maintain upright posture during sit to stands however only for limited time due to decreased glute activation and muscle fatigue. Patient had difficulty with increasing speed of movement noticeable with seated toe taps likely due to decreased motor intiation. Patient will continue to benefit from skilled physical therapy to improve strength,balance, and functional mobility.    Clinical Impairments Affecting Rehab Potential  hx of HTN, HLD, CVA, learning disability, Diabetes, brain tumor,  PT Frequency  2x / week    PT Duration  8 weeks    PT Treatment/Interventions  ADLs/Self Care Home Management;Aquatic Therapy;Ultrasound;Moist Heat;Traction;DME Instruction;Gait training;Stair training;Functional mobility training;Therapeutic activities;Therapeutic exercise;Orthotic Fit/Training;Neuromuscular re-education;Balance training;Patient/family education;Manual techniques;Wheelchair mobility training;Passive range of motion;Energy conservation;Taping;Visual/perceptual remediation/compensation    PT Next Visit Plan  balance and stability and ambulation      PT Home Exercise Plan  TrA contraction     Consulted and Agree with Plan of Care  Patient       Patient will benefit from skilled therapeutic intervention in order to improve the following deficits and impairments:  Abnormal gait, Decreased activity tolerance, Decreased balance, Decreased knowledge of precautions, Decreased endurance, Decreased coordination, Decreased knowledge of use of DME, Decreased mobility, Decreased range of motion, Difficulty walking, Decreased safety awareness, Decreased strength, Impaired flexibility, Impaired perceived functional ability, Impaired tone, Postural dysfunction, Improper body  mechanics, Pain  Visit Diagnosis: Muscle weakness (generalized)  Other lack of coordination  Other abnormalities of gait and mobility     Problem List Patient Active Problem List   Diagnosis Date Noted  . Acute CVA (cerebrovascular accident) ([place]) 05/02/2016  . Left-sided weakness 05/01/2016   Erick Blinks, SPT  This entire session was performed under direct supervision and direction of a licensed therapist/therapist assistant . I have personally read, edited and approve of the note as written.  Janna Arch, PT, DPT   01/14/2018, 3:26 PM  [place] MAIN [facility] SERVICES [address] [place], [place], [address] Phone: [phone]   Fax:  [phone]  Name: Curtis Powell MRN: [id number] Date of Birth: 10/01/1965

## 2018-01-14 NOTE — Therapy (Addendum)
[place] MAIN [facility] SERVICES [address] [place], [place], [address] Phone: [phone]   Fax:  [phone]  Occupational Therapy Treatment/Re-Certification   Patient Details  Name: Curtis Powell MRN: [id number] Date of Birth: [birth year] Referring Provider (OT): Dr. Bobette Mo   Encounter Date: 01/14/2018  OT End of Session - 01/14/18 1350    Visit Number  38    Number of Visits  65    Date for OT Re-Evaluation  04/08/18    Authorization Type  Visit 8 of 10 for progress reporting period starting 10/07/2017    OT Start Time  1333    OT Stop Time  1400    OT Time Calculation (min)  27 min    Activity Tolerance  Patient tolerated treatment well    Behavior During Therapy  [facility] for tasks assessed/performed       Past Medical History:  Diagnosis Date  . GERD (gastroesophageal reflux disease)   . Hyperlipidemia   . Hypertension   . Obesity   . Stroke [facility])     Past Surgical History:  Procedure Laterality Date  . BRAIN SURGERY      There were no vitals filed for this visit.  Subjective Assessment - 01/14/18 1409    Subjective   Pt reports he is doing well and has not experienced any shoulder pain within the last week    Pertinent History  Pt. is a 52 y.o. male who suffered a CVA on 05/01/2016. Pt. was admitted to the hospital. Once discharged, he received Home Health PT and OT services for about a month. Pt. has had multiple CVAs over the past 8 years, and has had multiple falls in the past 6 months. Pt. resides in an apartment. Pt. has caregivers for 80 hours. Pt.'s mother stays with pt. at night, and assists with IADL tasks.      Patient Stated Goals  To be able to throw a ball and dribble a ball.    Currently in Pain?  No/denies    Pain Score  0-No pain         [facility] OT Assessment - 01/14/18 1411      Written Expression   Dominant Hand  Right    Handwriting  75% legible   ~80% legibility to print name     Coordination    Right 9 Hole Peg Test  44    Left 9 Hole Peg Test  40      Hand Function   Right Hand Grip (lbs)  40    Right Hand Lateral Pinch  17 lbs    Right Hand 3 Point Pinch  13 lbs    Left Hand Grip (lbs)  62    Left Hand Lateral Pinch  15 lbs    Left 3 point pinch  14 lbs       OT TREATMENT:  Measurements were obtained for grip, pinch. [place], and UE strength for re-evaluation. Pt's goals were updated to reflect progress thus far.  Neuromuscular re-education: Pt completed [facility] task that required him to use R hand to place small pegs into pegboard. Pt required increased time and frequently dropped pegs. Pt opposed digits 2-5 to thumb to remove pegs. Pt was unable to hold pegs when opposing 4th and 5th digits to thumb.                OT Education - 01/14/18 1350    Education provided  Yes    Education Details  POC, Re-evaluation process, goals    Person(s) Educated  Patient    Methods  Explanation    Comprehension  Verbalized understanding       OT Short Term Goals - 03/03/17 1459      OT SHORT TERM GOAL #1   Title  `        OT Long Term Goals - 01/14/18 1351      OT LONG TERM GOAL #1   Title  Pt. will UE functioning to be able to perfrom toilet hygiene with minimal assistance.    Baseline  01/14/2018 - Pt completes toileting hygiene independently. Pt requires assistance managing LB clothing.    Time  12    Period  Weeks    Status  Achieved    Target Date  01/14/18      OT LONG TERM GOAL #2   Title  Pt. will complete self grooming with minA    Baseline  01/14/2018 - Pt is independent with oral care and washing his face and head but requires assistance for shaving.    Time  12    Period  Weeks    Status  On-going    Target Date  04/08/18      OT LONG TERM GOAL #3   Title  Pt. will perform self-feeding skills with modified indepndence    Baseline  01/14/2018 - Pt is able to complete self-feeding independently with minimal spillage. Pt is unable to cut meat at this  time.    Time  12    Period  Weeks    Status  Achieved    Target Date  01/14/18      OT LONG TERM GOAL #4   Title  Pt. will perform self dressing with minA and A/E as needed.    Baseline  01/14/2018 - Pt. continues to require extensive assist for LB dressing due to balance deficits: Pt. requires min A for UB dressing.    Time  12    Period  Weeks    Status  On-going    Target Date  04/08/18      OT LONG TERM GOAL #5   Title  Pt. will perform light home making tasks with minA    Baseline  01/14/2018 - Pt is able to wash dishes while seated in w/c with increased accessibility of new apartment. Pt does not complete laundry nor sweeping, vacuuming, and dusting.     Time  12    Period  Weeks    Status  Achieved    Target Date  01/14/18      Long Term Additional Goals   Additional Long Term Goals  Yes      OT LONG TERM GOAL #6   Title  Pt. will write his name efficiently with 100% legibility    Baseline  01/14/2018 - ~80% legibility for name. Requires increased time to do so.    Time  8    Period  Weeks    Status  On-going    Target Date  03/11/18      OT LONG TERM GOAL #7   Title  Pt will independently and consistently follow HEP to increase UE strength to increase functional independence    Baseline  01/14/2018 - Pt does not have a HEP at this time    Time  6    Period  Weeks    Status  New    Target Date  02/25/18  Plan - 01/14/18 1410    Clinical Impression Statement  Measurements were obtained for reassessment. Pt demonstrates increased [place] and strength in the RUE. Pt demonstrates increased legibility while writing name in cursive and print forms. Pt's goals were updated to reflect progress thus far. Pt reports increased independence during self-care tasks including UB dressing and grooming tasks. Pt to benefit from skilled OT services to address deficits in LB dressing and UE strength to promote functional independence.     Occupational performance deficits  (Please refer to evaluation for details):  ADL's;IADL's    Rehab Potential  Fair    OT Frequency  2x / week    OT Duration  12 weeks    OT Treatment/Interventions  Self-care/ADL training;Patient/family education;DME and/or AE instruction;Therapeutic exercise;Moist Heat;Neuromuscular education    Clinical Decision Making  Several treatment options, min-mod task modification necessary    Consulted and Agree with Plan of Care  Patient       Patient will benefit from skilled therapeutic intervention in order to improve the following deficits and impairments:  Pain, Decreased cognition, Decreased activity tolerance, Impaired UE functional use, Decreased range of motion, Decreased coordination, Decreased endurance  Visit Diagnosis: Other lack of coordination    Problem List Patient Active Problem List   Diagnosis Date Noted  . Acute CVA (cerebrovascular accident) ([place]) 05/02/2016  . Left-sided weakness 05/01/2016    Oliver Hum, OTS 01/14/2018, 2:17 PM   The entire therapy session was performed under the direct supervision and direction of a licensed therapist/therapist assistant.  I have personally read edited and approve of the note as written.  Chrys Racer, OTR/L ascom [phone] 01/14/18, 2:33 PM   [place] MAIN [facility] SERVICES [address] [place], [place], [address] Phone: [phone]   Fax:  [phone]  Name: BETZALEL UMBARGER MRN: [id number] Date of Birth: 09/18/1965

## 2018-01-20 ENCOUNTER — Ambulatory Visit: Payer: Medicare HMO

## 2018-01-20 ENCOUNTER — Encounter: Payer: Self-pay | Admitting: Occupational Therapy

## 2018-01-20 ENCOUNTER — Ambulatory Visit: Payer: Medicare HMO | Admitting: Occupational Therapy

## 2018-01-20 DIAGNOSIS — M6281 Muscle weakness (generalized): Secondary | ICD-10-CM

## 2018-01-20 DIAGNOSIS — R2689 Other abnormalities of gait and mobility: Secondary | ICD-10-CM

## 2018-01-20 DIAGNOSIS — R278 Other lack of coordination: Secondary | ICD-10-CM

## 2018-01-20 DIAGNOSIS — I69351 Hemiplegia and hemiparesis following cerebral infarction affecting right dominant side: Secondary | ICD-10-CM

## 2018-01-20 NOTE — Therapy (Addendum)
[place] MAIN [facility] SERVICES [address] [place], [place], [address] Phone: [phone]   Fax:  [phone]   Physical Therapy Progress Note   Dates of reporting period  11/27/17   to   01/20/18   Patient Details  Name: Curtis Powell MRN: [id number] Date of Birth: October 28, 1965 Referring Provider (PT): Dr. Larinda Buttery   Encounter Date: 01/20/2018  PT End of Session - 01/20/18 1441    Visit Number  4    Number of Visits  48    Date for PT Re-Evaluation  02/12/18    Authorization Type  10/10 ( PN start 8/22; 1/10 begins 10/15)     PT Start Time  1347    PT Stop Time  1431    PT Time Calculation (min)  44 min    Equipment Utilized During Treatment  Gait belt    Activity Tolerance  Patient tolerated treatment well    Behavior During Therapy  WFL for tasks assessed/performed       Past Medical History:  Diagnosis Date  . GERD (gastroesophageal reflux disease)   . Hyperlipidemia   . Hypertension   . Obesity   . Stroke [facility])     Past Surgical History:  Procedure Laterality Date  . BRAIN SURGERY      There were no vitals filed for this visit.  Subjective Assessment - 01/20/18 1439    Subjective  Patient reports he is doing well. States he did not walk this morning since he has therapy but has been walking around his house everyday. Reports no falls or stumbles since last session. States he noticed he walks better and has better balance over the last couple of months.    Pertinent History   Patient is a pleasant 52 year old male who presents to physical therapy for weakness and immobility secondary to CVA.  Had a stroke in Feb 2018. He was previously fully independent, but this stroke caused severe residual deficits, mainly on the right side as well as speech, and now he is unable to walk or perform most of his ADLs on his own. Entire right side is very weak. He still has a little difficulty with speech but his swallowing  is improved to baseline. His mom had to move in with him and now is his main caregiver.     Limitations  Sitting;Lifting;Standing;Walking;House hold activities    How long can you sit comfortably?  5 minutes    How long can you stand comfortably?  5 minutes    How long can you walk comfortably?  10 ft    Patient Stated Goals  Walk without walker, walk further with walker, get some strength in back     Currently in Pain?  No/denies       Treatment:  5xsts- from plinth table using RW. 1 hand on table 1 hand on RW. 1st trail 23 seconds; 2nd trail 17 seconds; 37 seconds with upright posture verbal cueing to stand all the way up  40mt- 259mutes 12 seconds with RW and wheelchair follow. Decreased hip extension, Decreased weight shift onto RLE limiting LLE foot clearance, flexed posture Strength  LEFS- 37/80  BERG- 21/56   Bathroom mobility performed at end of session: addressed standing balance and mobility. CGA   Patient's condition has the potential to improve in response to therapy. Maximum improvement is yet to be obtained. The anticipated improvement is attainable and reasonable in a generally predictable time.  Patient  reports he is walking better and has better balance.    [facility] PT Assessment - 01/20/18 0001      Berg Balance Test   Sit to Stand  Able to stand  independently using hands    Standing Unsupported  Able to stand 2 minutes with supervision    Sitting with Back Unsupported but Feet Supported on Floor or Stool  Able to sit safely and securely 2 minutes    Stand to Sit  Uses backs of legs against chair to control descent    Transfers  Able to transfer with verbal cueing and /or supervision    Standing Unsupported with Eyes Closed  Able to stand 10 seconds with supervision    Standing Ubsupported with Feet Together  Needs help to attain position but able to stand for 30 seconds with feet together    From Standing, Reach Forward with Outstretched Arm  Reaches forward but  needs supervision    From Standing Position, Pick up Object from Floor  Unable to try/needs assist to keep balance    From Standing Position, Turn to Look Behind Over each Shoulder  Needs supervision when turning    Turn 360 Degrees  Needs assistance while turning    Standing Unsupported, Alternately Place Feet on Step/Stool  Able to complete >2 steps/needs minimal assist    Standing Unsupported, One Foot in Front  Loses balance while stepping or standing    Standing on One Leg  Unable to try or needs assist to prevent fall    Total Score  21        PT Education - 01/20/18 1440    Education provided  Yes    Education Details  exercise technique, HEP, goals    Person(s) Educated  Patient    Methods  Explanation;Demonstration;Verbal cues    Comprehension  Verbalized understanding;Returned demonstration       PT Short Term Goals - 01/20/18 1501      PT SHORT TERM GOAL #1   Title  Patient will be independent in home exercise program to improve strength/mobility for better functional independence with ADLs.    Baseline  hep compliant    Time  2    Period  Weeks    Status  Achieved      PT SHORT TERM GOAL #2   Title  Patient will require min cueing for STS transfer with CGA for increased independence with mobility.     Baseline  CGA    Time  2    Period  Weeks    Status  Achieved      PT SHORT TERM GOAL #3   Title  Patient will maintain upright posture for > 15 seconds to demonstrate strengthened postural control muscualture     Baseline  18 seconds     Time  2    Period  Weeks    Status  Achieved        PT Long Term Goals - 01/20/18 1446      PT LONG TERM GOAL #1   Title  Patient will perform bed mobility with Supervision to increase independent mobility    Baseline  able to perform mod I for increased time , occasional Min A for scooting up in bed; 7/16: min guard for safety with increased effort and time, cues to scoot back in bed before sit>supine 9/12: can do it  independently but with difficulty    Time  8    Period  Weeks  Status  Achieved      PT LONG TERM GOAL #2   Title  Patient (< 72 years old) will complete five times sit to stand test in < 10 seconds indicating an increased LE strength and improved balance.    Baseline  2/18: 30 seconds from plinth chair height single UE on seat and single on walker ; 32 seconds from plinth with single UE on seat and single on walker. 4/10: 26 seconds from [facility] with one hand on walker5/29: 22.2 seconds with one hand on walker one hand on WC; 7/16: one hand on walker one hand on WC 18.96 seconds; pt achieving 75% of full upright standing 8/22:one hand on walker one hand on WC 18 seconds; pt achieving 75% of full upright standing  9/12: 18 seconds econds; pt achieving 75% of full upright standing 8/22:one hand on walker one hand on WC 10/15: 17seconds; pt achieving 75% of full upright posture    Time  8    Period  Weeks    Status  Partially Met      PT LONG TERM GOAL #3   Title  Patient will ambulate 40 ft with walker with no breaks to allow for increased mobility within home.     Baseline  12/26: 29 ft with walker and WC follow with no rest breaks; 2/18: multiple trials previous session of >100 ft     Time  8    Period  Weeks    Status  Achieved      PT LONG TERM GOAL #4   Title  Patient will increase BLE gross strength to 4+/5 as to improve functional strength for independent gait, increased standing tolerance and increased ADL ability.    Baseline   4-/5 RLE, 10/15: 4/5 RLE    Time  8    Period  Weeks    Status  Partially Met      PT LONG TERM GOAL #5   Title  Patient will increase lower extremity functional scale to >40/80 to demonstrate improved functional mobility and increased tolerance with ADLs.     Baseline  14/80; 12/26: 21/80; 2/18: 28/80  4/10; 25/80 5/29: 27/80; 7/16: 29/80 8/22: 30/80  9/12: 26/80 (feeling more tired due to moving) 10/15: 37/80    Time  8    Period  Weeks    Status   Partially Met      PT LONG TERM GOAL #6   Title  Patient will walk through a door involving one small step up/down to increase functional independence.     Baseline  require CGA  BUE support in // bars at this time 10/15: required BUE and CGA in //bars    Time  8    Period  Weeks    Status  Partially Met      PT LONG TERM GOAL #7   Title  Patient will ambulate 40 ft with least assistive device and Supervision with no breaks to allow for increased mobility within home.     Baseline  requires use of walker and CGA     Time  8    Period  Weeks    Status  Achieved      PT LONG TERM GOAL #8   Title  Patient will perform 10 MWT in < 1 minute to demonstrate improved ambulatory velocity and capacity.     Baseline  4/10: 2 minutes 45 seconds; 5/29: 2 minute 31 seconds; 3 minutes 22 seconds 8/22: 3 minutes 2 seconds  9/12:  2 minutes and 43 seconds 10/15: 55mnute 12 seconds    Time  8    Period  Weeks    Status  Partially Met      PT LONG TERM GOAL  #9   TITLE  Patient will increase Berg Balance score by > 6 points (26/56) to demonstrate decreased fall risk during functional activities.    Baseline  9/12: 20/56 10/15: 21/56    Time  8    Period  Weeks    Status  Partially Met      PT LONG TERM GOAL  #10   TITLE  Patient (< 727years old) will complete five times sit to stand test with full upright posture in < 30 seconds indicating an increased glute activation, LE strength and improved balance.    Baseline  10/15: 37seconds    Time  8    Period  Weeks    Status  New            Plan - 01/20/18 1508    Clinical Impression Statement  Patient continues to make progress towards goals demonstarted improved 188m, BERG, and 5 times sit to stand score. Patient decreased time during 106m(2mi48mes 43 seconds- 2 minutes 12 seconds) indicating improved gait speed aiding with household ambulation. Patient also improved LEFS from  26/80 to 37/80 demonstrating improved perceived LE functional  strength and mobility. Patient still has flexed posture during ambulation and decreased balance likely due to decreased glute activation and LE weakness which increases his fall risk. Patient's condition has the potential to improve in response to therapy. Maximum improvement is yet to be obtained. The anticipated improvement is attainable and reasonable in a generally predictable time. Patient will continue to benefit from skilled physical therapy to improve strength, balance, and functional mobility.     Clinical Impairments Affecting Rehab Potential  hx of HTN, HLD, CVA, learning disability, Diabetes, brain tumor,     PT Frequency  2x / week    PT Duration  8 weeks    PT Treatment/Interventions  ADLs/Self Care Home Management;Aquatic Therapy;Ultrasound;Moist Heat;Traction;DME Instruction;Gait training;Stair training;Functional mobility training;Therapeutic activities;Therapeutic exercise;Orthotic Fit/Training;Neuromuscular re-education;Balance training;Patient/family education;Manual techniques;Wheelchair mobility training;Passive range of motion;Energy conservation;Taping;Visual/perceptual remediation/compensation    PT Next Visit Plan  balance and stability and ambulation      PT Home Exercise Plan  TrA contraction     Consulted and Agree with Plan of Care  Patient       Patient will benefit from skilled therapeutic intervention in order to improve the following deficits and impairments:  Abnormal gait, Decreased activity tolerance, Decreased balance, Decreased knowledge of precautions, Decreased endurance, Decreased coordination, Decreased knowledge of use of DME, Decreased mobility, Decreased range of motion, Difficulty walking, Decreased safety awareness, Decreased strength, Impaired flexibility, Impaired perceived functional ability, Impaired tone, Postural dysfunction, Improper body mechanics, Pain  Visit Diagnosis: Muscle weakness (generalized)  Other lack of coordination  Other  abnormalities of gait and mobility  Hemiplegia and hemiparesis following cerebral infarction affecting right dominant side ([facility]  Problem List Patient Active Problem List   Diagnosis Date Noted  . Acute CVA (cerebrovascular accident) ([facility])Tensed/25/2018  . Left-sided weakness 05/01/2016   AnnaErick BlinksT 01/20/2018, 5:11 PM   This entire session was performed under direct supervision and direction of a licensed therapist/therapist assistant . I have personally read, edited and approve of the note as written.  DianLieutenant Diego DPT 5:13 PM,01/20/18 [address] [place], [place], [address] Phone: [phone]   Fax:  [phone]  Name: Curtis Powell MRN: [id number] Date of Birth: 01-15-1966

## 2018-01-20 NOTE — Therapy (Addendum)
[place] MAIN [facility] SERVICES [address] [place], [place], [address] Phone: [phone]   Fax:  [phone]  Occupational Therapy Treatment  Patient Details  Name: Curtis Powell MRN: [id number] Date of Birth: December 01, 1965 Referring Provider (OT): Dr. Bobette Mo   Encounter Date: 01/20/2018  OT End of Session - 01/20/18 1354    Visit Number  38    Number of Visits  65    Date for OT Re-Evaluation  04/08/18    Authorization Type  Visit 9 of 10 for progress reporting period starting 10/07/2017    OT Start Time  1300    OT Stop Time  1345    OT Time Calculation (min)  45 min    Activity Tolerance  Patient tolerated treatment well    Behavior During Therapy  [facility] for tasks assessed/performed       Past Medical History:  Diagnosis Date  . GERD (gastroesophageal reflux disease)   . Hyperlipidemia   . Hypertension   . Obesity   . Stroke [facility])     Past Surgical History:  Procedure Laterality Date  . BRAIN SURGERY      There were no vitals filed for this visit.  Subjective Assessment - 01/20/18 1352    Subjective   Pt reports that he had a relaxing weekend and is doing well today.    Pertinent History  Pt. is a 52 y.o. male who suffered a CVA on 05/01/2016. Pt. was admitted to the hospital. Once discharged, he received Home Health PT and OT services for about a month. Pt. has had multiple CVAs over the past 8 years, and has had multiple falls in the past 6 months. Pt. resides in an apartment. Pt. has caregivers for 80 hours. Pt.'s mother stays with pt. at night, and assists with IADL tasks.      Patient Stated Goals  To be able to throw a ball and dribble a ball.    Currently in Pain?  No/denies    Pain Score  0-No pain       OT TREATMENT  Therapeutic exercise: Pt completed UE strengthening exercises including chest press, shoulder press, clockwise and counter-clockwise large shoulder rotations, and bicep curls using a 2# weighted dowel  for 2 sets of 10 reps each. Pt required demonstration for proper form and technique and verbal cuing for proper breathing techniques. Pt was encouraged to count reps aloud as he was holding his breath during reps.  Pt completed reaching task that required him to sit unsupported and reach bilaterally while crossing midline in all planes. Pt required contact guard assist to safely maintain balance when reaching.  Self-care: Pt completed simulated LB dressing while sitting unsupported on the edge of the mat. Pt required contact guard assist to safely maintain balance when shifting weight bilaterally to hike hips to don dressing loop. Pt demonstrated signs of increased exertion when shifting weight bilaterally. Pt required x2 rest breaks to catch his breath. Pt was unable to don dressing loop completely over hips due to increased friction of loop against pants.                OT Education - 01/20/18 1353    Education provided  Yes    Education Details  UE strengthening, LB dressing, reaching    Person(s) Educated  Patient    Methods  Explanation;Demonstration    Comprehension  Verbalized understanding;Returned demonstration;Verbal cues required       OT Short Term Goals -  03/03/17 1459      OT SHORT TERM GOAL #1   Title  `        OT Long Term Goals - 01/14/18 1351      OT LONG TERM GOAL #1   Title  Pt. will UE functioning to be able to perfrom toilet hygiene with minimal assistance.    Baseline  01/14/2018 - Pt completes toileting hygiene independently. Pt requires assistance managing LB clothing.    Time  12    Period  Weeks    Status  Achieved    Target Date  01/14/18      OT LONG TERM GOAL #2   Title  Pt. will complete self grooming with minA    Baseline  01/14/2018 - Pt is independent with oral care and washing his face and head but requires assistance for shaving.    Time  12    Period  Weeks    Status  On-going    Target Date  04/08/18      OT LONG TERM GOAL #3    Title  Pt. will perform self-feeding skills with modified indepndence    Baseline  01/14/2018 - Pt is able to complete self-feeding independently with minimal spillage. Pt is unable to cut meat at this time.    Time  12    Period  Weeks    Status  Achieved    Target Date  01/14/18      OT LONG TERM GOAL #4   Title  Pt. will perform self dressing with minA and A/E as needed.    Baseline  01/14/2018 - Pt. continues to require extensive assist for LB dressing due to balance deficits: Pt. requires min A for UB dressing.    Time  12    Period  Weeks    Status  On-going    Target Date  04/08/18      OT LONG TERM GOAL #5   Title  Pt. will perform light home making tasks with minA    Baseline  01/14/2018 - Pt is able to wash dishes while seated in w/c with increased accessibility of new apartment. Pt does not complete laundry nor sweeping, vacuuming, and dusting.     Time  12    Period  Weeks    Status  Achieved    Target Date  01/14/18      Long Term Additional Goals   Additional Long Term Goals  Yes      OT LONG TERM GOAL #6   Title  Pt. will write his name efficiently with 100% legibility    Baseline  01/14/2018 - ~80% legibility for name. Requires increased time to do so.    Time  8    Period  Weeks    Status  On-going    Target Date  03/11/18      OT LONG TERM GOAL #7   Title  Pt will independently and consistently follow HEP to increase UE strength to increase functional independence    Baseline  01/14/2018 - Pt does not have a HEP at this time    Time  6    Period  Weeks    Status  New    Target Date  02/25/18            Plan - 01/20/18 1355    Clinical Impression Statement  Pt continues to have issues completing LB dressing while standing due to balance deficits. Pt worked on simulated LB dressing while  sitting and wt shifting bilaterally. Pt demonstrates decreased sitting balance when sitting unsupported on the edge of the mat. Pt demonstrates decreased ROM when  reaching for functional items with RUE. Pt to continue working to improve LB dressing, UE strength, and functional reaching to increase independence during ADLs and IADLs.    Occupational performance deficits (Please refer to evaluation for details):  ADL's;IADL's    Rehab Potential  Fair    OT Frequency  2x / week    OT Duration  12 weeks    OT Treatment/Interventions  Self-care/ADL training;Patient/family education;DME and/or AE instruction;Therapeutic exercise;Moist Heat;Neuromuscular education    Clinical Decision Making  Several treatment options, min-mod task modification necessary    Consulted and Agree with Plan of Care  Patient       Patient will benefit from skilled therapeutic intervention in order to improve the following deficits and impairments:  Pain, Decreased cognition, Decreased activity tolerance, Impaired UE functional use, Decreased range of motion, Decreased coordination, Decreased endurance  Visit Diagnosis: Muscle weakness (generalized)    Problem List Patient Active Problem List   Diagnosis Date Noted  . Acute CVA (cerebrovascular accident) ([place]) 05/02/2016  . Left-sided weakness 05/01/2016    Oliver Hum, OTS 01/20/2018, 2:38 PM   This entire session was performed under direct supervision and direction of a licensed therapist/therapist assistant.  I have personally read, edited and approve of the note as written.  Chrys Racer, OTR/L ascom [phone] 01/20/18, 3:40 PM   [place] MAIN [facility] SERVICES [address] [place], [place], [address] Phone: [phone]   Fax:  [phone]  Name: Curtis Powell MRN: [id number] Date of Birth: 1966-03-09

## 2018-01-22 ENCOUNTER — Encounter: Payer: Self-pay | Admitting: Occupational Therapy

## 2018-01-22 ENCOUNTER — Ambulatory Visit: Payer: Medicare HMO

## 2018-01-22 ENCOUNTER — Ambulatory Visit: Payer: Medicare HMO | Admitting: Occupational Therapy

## 2018-01-22 DIAGNOSIS — M6281 Muscle weakness (generalized): Secondary | ICD-10-CM

## 2018-01-22 DIAGNOSIS — R278 Other lack of coordination: Secondary | ICD-10-CM

## 2018-01-22 DIAGNOSIS — R2689 Other abnormalities of gait and mobility: Secondary | ICD-10-CM

## 2018-01-22 NOTE — Therapy (Addendum)
[place] MAIN [facility] SERVICES [address] [place], [place], [address] Phone: [phone]   Fax:  [phone]  Physical Therapy Treatment  Patient Details  Name: Curtis Powell MRN: [id number] Date of Birth: Feb 27, 1966 Referring Provider (PT): Dr. Larinda Buttery   Encounter Date: 01/22/2018  PT End of Session - 01/22/18 1609    Visit Number  3    Number of Visits  26    Date for PT Re-Evaluation  02/12/18    Authorization Type  PN 1/10 begins 10/15     PT Start Time  1517    PT Stop Time  1600    PT Time Calculation (min)  43 min    Equipment Utilized During Treatment  Gait belt    Activity Tolerance  Patient tolerated treatment well    Behavior During Therapy  [facility] for tasks assessed/performed       Past Medical History:  Diagnosis Date  . GERD (gastroesophageal reflux disease)   . Hyperlipidemia   . Hypertension   . Obesity   . Stroke [facility])     Past Surgical History:  Procedure Laterality Date  . BRAIN SURGERY      There were no vitals filed for this visit.  Subjective Assessment - 01/22/18 1524    Subjective  Patient report doing well and no pain. States he has not had any falls since last visit. Reports HEP compliance.     Pertinent History   Patient is a pleasant 52 year old male who presents to physical therapy for weakness and immobility secondary to CVA.  Had a stroke in Feb 2018. He was previously fully independent, but this stroke caused severe residual deficits, mainly on the right side as well as speech, and now he is unable to walk or perform most of his ADLs on his own. Entire right side is very weak. He still has a little difficulty with speech but his swallowing is improved to baseline. His mom had to move in with him and now is his main caregiver.     Limitations  Sitting;Lifting;Standing;Walking;House hold activities    How long can you sit comfortably?  5 minutes    How long can you stand  comfortably?  5 minutes    How long can you walk comfortably?  10 ft    Patient Stated Goals  Walk without walker, walk further with walker, get some strength in back     Currently in Pain?  No/denies           Tranfer to and from nustep from wheelchair to RW. CGA  Nustep lvl 3 3 minutes >60rpm for cardiovascular challenge and speed for motor initiation.   Ambulate in hallway with RW and CGA  with WC follow, Verbal cues for weight shift onto RLE for L foot clearance.  Frequent cueing via verbal and/or tactile for upright posture and increased step length. Verbal cues to look up and not at ground. Tactile cues for gluteal activation to improve step length and RLE stance time. 1 Patient has decreased velocity resulting in prolonged timed needed for distance ambulation. x21f   Sit to stands from wheelchair to RW CGA with BUE use x10 with 5 second holds for upright posture. Verbal  And tactile cues for glute activation, pushing through LE, and upright posture.     Seated marches x10 each leg. Verbal cues to increase speed of movement to increase velocity of movements for carryover to ambulatory mechanics  Seated LAQ 2x10 each LE. Verbal cues to increase speed of movement for carryover to gait mechanics and velocity. First set keeping with both extremities at same time. 2nd set reciprocal pattern   Bathroom mobility performed at beginning of session: addressed standing balance and mobility                   PT Education - 01/22/18 1608    Education provided  Yes    Education Details  exercise technique, HEP, adding speed to movements    Person(s) Educated  Patient    Methods  Explanation;Demonstration;Verbal cues    Comprehension  Verbalized understanding;Returned demonstration       PT Short Term Goals - 01/20/18 1501      PT SHORT TERM GOAL #1   Title  Patient will be independent in home exercise program to improve strength/mobility for better functional  independence with ADLs.    Baseline  hep compliant    Time  2    Period  Weeks    Status  Achieved      PT SHORT TERM GOAL #2   Title  Patient will require min cueing for STS transfer with CGA for increased independence with mobility.     Baseline  CGA    Time  2    Period  Weeks    Status  Achieved      PT SHORT TERM GOAL #3   Title  Patient will maintain upright posture for > 15 seconds to demonstrate strengthened postural control muscualture     Baseline  18 seconds     Time  2    Period  Weeks    Status  Achieved        PT Long Term Goals - 01/22/18 1619      PT LONG TERM GOAL #1   Title  Patient will perform bed mobility with Supervision to increase independent mobility    Baseline  able to perform mod I for increased time , occasional Min A for scooting up in bed; 7/16: min guard for safety with increased effort and time, cues to scoot back in bed before sit>supine 9/12: can do it independently but with difficulty    Time  8    Period  Weeks    Status  Achieved      PT LONG TERM GOAL #2   Title  Patient (< 27 years old) will complete five times sit to stand test in < 10 seconds indicating an increased LE strength and improved balance.    Baseline  2/18: 30 seconds from plinth chair height single UE on seat and single on walker ; 32 seconds from plinth with single UE on seat and single on walker. 4/10: 26 seconds from [facility] with one hand on walker5/29: 22.2 seconds with one hand on walker one hand on WC; 7/16: one hand on walker one hand on WC 18.96 seconds; pt achieving 75% of full upright standing 8/22:one hand on walker one hand on WC 18 seconds; pt achieving 75% of full upright standing  9/12: 18 seconds econds; pt achieving 75% of full upright standing 8/22:one hand on walker one hand on WC 10/15: 17seconds; pt achieving 75% of full upright posture    Time  8    Period  Weeks    Status  Partially Met      PT LONG TERM GOAL #3   Title  Patient will ambulate 40 ft with  walker with no breaks to allow  for increased mobility within home.     Baseline  12/26: 29 ft with walker and WC follow with no rest breaks; 2/18: multiple trials previous session of >100 ft     Time  8    Period  Weeks    Status  Achieved      PT LONG TERM GOAL #4   Title  Patient will increase BLE gross strength to 4+/5 as to improve functional strength for independent gait, increased standing tolerance and increased ADL ability.    Baseline   4-/5 RLE, 10/15: 4/5 RLE    Time  8    Period  Weeks    Status  Partially Met      PT LONG TERM GOAL #5   Title  Patient will increase lower extremity functional scale to >40/80 to demonstrate improved functional mobility and increased tolerance with ADLs.     Baseline  14/80; 12/26: 21/80; 2/18: 28/80  4/10; 25/80 5/29: 27/80; 7/16: 29/80 8/22: 30/80  9/12: 26/80 (feeling more tired due to moving) 10/15: 37/80    Time  8    Period  Weeks    Status  Partially Met      PT LONG TERM GOAL #6   Title  Patient will walk through a door involving one small step up/down to increase functional independence.     Baseline  require CGA  BUE support in // bars at this time 10/15: required BUE and CGA in //bars    Time  8    Period  Weeks    Status  Partially Met      PT LONG TERM GOAL #7   Title  Patient will ambulate 40 ft with least assistive device and Supervision with no breaks to allow for increased mobility within home.     Baseline  requires use of walker and CGA     Time  8    Period  Weeks    Status  Achieved      PT LONG TERM GOAL #8   Title  Patient will perform 10 MWT in < 1 minute to demonstrate improved ambulatory velocity and capacity.     Baseline  4/10: 2 minutes 45 seconds; 5/29: 2 minute 31 seconds; 3 minutes 22 seconds 8/22: 3 minutes 2 seconds  9/12:  2 minutes and 43 seconds 10/15: 57mnute 12 seconds    Time  8    Period  Weeks    Status  Partially Met      PT LONG TERM GOAL  #9   TITLE  Patient will increase Berg Balance  score by > 6 points (26/56) to demonstrate decreased fall risk during functional activities.    Baseline  9/12: 20/56 10/15: 21/56    Time  8    Period  Weeks    Status  Partially Met      PT LONG TERM GOAL  #10   TITLE  Patient (< 640years old) will complete five times sit to stand test with full upright posture in < 30 seconds indicating an increased glute activation, LE strength and improved balance.    Baseline  10/15: 37seconds    Time  8    Period  Weeks    Status  New      PT LONG TERM GOAL  #11   TITLE  Patient will ambulate 100 ft with least assistive device and Supervision with no breaks to allow for increased mobility within home.     Baseline  10/17: 74f    Time  8    Period  Weeks    Status  New    Target Date  03/19/18            Plan - 01/22/18 1610    Clinical Impression Statement  Patient ambulated in hallway 846fbefore requiring seated rest break demonstrating improved walking distance. Patient performed seated marches at increased speed with frequent cueing, however velocity would decrease with increased repetition and fatigue. Patient still challenged with upright posture during sit to stands due to decreased glute activation and poor muscular endurance. Patient will continue to benefit from skilled physical therapy to improve strength, balance, and functional mobility.     Clinical Impairments Affecting Rehab Potential  hx of HTN, HLD, CVA, learning disability, Diabetes, brain tumor,     PT Frequency  2x / week    PT Duration  8 weeks    PT Treatment/Interventions  ADLs/Self Care Home Management;Aquatic Therapy;Ultrasound;Moist Heat;Traction;DME Instruction;Gait training;Stair training;Functional mobility training;Therapeutic activities;Therapeutic exercise;Orthotic Fit/Training;Neuromuscular re-education;Balance training;Patient/family education;Manual techniques;Wheelchair mobility training;Passive range of motion;Energy conservation;Taping;Visual/perceptual  remediation/compensation    PT Next Visit Plan  balance and stability and ambulation      PT Home Exercise Plan  TrA contraction     Consulted and Agree with Plan of Care  Patient       Patient will benefit from skilled therapeutic intervention in order to improve the following deficits and impairments:  Abnormal gait, Decreased activity tolerance, Decreased balance, Decreased knowledge of precautions, Decreased endurance, Decreased coordination, Decreased knowledge of use of DME, Decreased mobility, Decreased range of motion, Difficulty walking, Decreased safety awareness, Decreased strength, Impaired flexibility, Impaired perceived functional ability, Impaired tone, Postural dysfunction, Improper body mechanics, Pain  Visit Diagnosis: Muscle weakness (generalized)  Other lack of coordination  Other abnormalities of gait and mobility     Problem List Patient Active Problem List   Diagnosis Date Noted  . [place]  . Left-sided weakness 05/01/2016   AnErick BlinksSPT  This entire session was performed under direct supervision and direction of a licensed therapist/therapist assistant . I have personally read, edited and approve of the note as written.  MaJanna ArchPT, DPT   01/23/2018, 8:17 AM  [facility] [address] [place]: [phone] Fax:  [phone]: Curtis Powell: [id number] of Birth: 12/1965-03-04

## 2018-01-22 NOTE — Therapy (Signed)
[place] MAIN [facility] SERVICES [address] [place], [place], [address] Phone: [phone]   Fax:  [phone]  Occupational Therapy Treatment  Patient Details  Name: Curtis Powell MRN: [id number] Date of Birth: 08-21-1965 Referring Provider (OT): Dr. Bobette Mo   Encounter Date: 01/22/2018  OT End of Session - 01/22/18 1504    Visit Number  75    Number of Visits  65    Date for OT Re-Evaluation  04/08/18    Authorization Type  Visit 2 of 10 for progress reporting period starting 01/20/2018    OT Start Time  1430    OT Stop Time  1515    OT Time Calculation (min)  45 min    Equipment Utilized During Treatment  Long handled shoe horn    Activity Tolerance  Patient tolerated treatment well    Behavior During Therapy  [facility] for tasks assessed/performed       Past Medical History:  Diagnosis Date  . GERD (gastroesophageal reflux disease)   . Hyperlipidemia   . Hypertension   . Obesity   . Stroke [facility])     Past Surgical History:  Procedure Laterality Date  . BRAIN SURGERY      There were no vitals filed for this visit.  Subjective Assessment - 01/22/18 1503    Subjective   Pt. reports no pain today, and is doing well.    Pertinent History  Pt. is a 52 y.o. male who suffered a CVA on 05/01/2016. Pt. was admitted to the hospital. Once discharged, he received Home Health PT and OT services for about a month. Pt. has had multiple CVAs over the past 8 years, and has had multiple falls in the past 6 months. Pt. resides in an apartment. Pt. has caregivers for 80 hours. Pt.'s mother stays with pt. at night, and assists with IADL tasks.      Currently in Pain?  No/denies      OT TREATMENT    Neuro muscular re-education:  Pt. worked on [facility] skills manipulating pegs from green resistive putty. Pt. Was able to grasp the pegs, however required assist with vision occluded.   Therapeutic Exercise:  Pt. performed dowel ex. For UE strengthening  secondary to weakness for  Bilateral shoulder flexion, chest press, circular patterns, and elbow flexion/extension were performed. Pt. Started with 1.5# for 1 set of 20 reps and progressed to 2# dowel for 1 set of 20 reps each. 2# dumbbell ex. for elbow flexion and extension, forearm supination/pronation, wrist flexion/extension, and radial deviation. Pt. requires rest breaks and verbal cues for proper technique. Pt. Performed without pain.                        OT Education - 01/22/18 1504    Education provided  Yes    Education Details  UE strengthening, LB dressing, reaching    Person(s) Educated  Patient    Methods  Explanation;Demonstration    Comprehension  Verbalized understanding;Returned demonstration;Verbal cues required       OT Short Term Goals - 03/03/17 1459      OT SHORT TERM GOAL #1   Title  `        OT Long Term Goals - 01/14/18 1351      OT LONG TERM GOAL #1   Title  Pt. will UE functioning to be able to perfrom toilet hygiene with minimal assistance.    Baseline  01/14/2018 - Pt completes  toileting hygiene independently. Pt requires assistance managing LB clothing.    Time  12    Period  Weeks    Status  Achieved    Target Date  01/14/18      OT LONG TERM GOAL #2   Title  Pt. will complete self grooming with minA    Baseline  01/14/2018 - Pt is independent with oral care and washing his face and head but requires assistance for shaving.    Time  12    Period  Weeks    Status  On-going    Target Date  04/08/18      OT LONG TERM GOAL #3   Title  Pt. will perform self-feeding skills with modified indepndence    Baseline  01/14/2018 - Pt is able to complete self-feeding independently with minimal spillage. Pt is unable to cut meat at this time.    Time  12    Period  Weeks    Status  Achieved    Target Date  01/14/18      OT LONG TERM GOAL #4   Title  Pt. will perform self dressing with minA and A/E as needed.    Baseline  01/14/2018 -  Pt. continues to require extensive assist for LB dressing due to balance deficits: Pt. requires min A for UB dressing.    Time  12    Period  Weeks    Status  On-going    Target Date  04/08/18      OT LONG TERM GOAL #5   Title  Pt. will perform light home making tasks with minA    Baseline  01/14/2018 - Pt is able to wash dishes while seated in w/c with increased accessibility of new apartment. Pt does not complete laundry nor sweeping, vacuuming, and dusting.     Time  12    Period  Weeks    Status  Achieved    Target Date  01/14/18      Long Term Additional Goals   Additional Long Term Goals  Yes      OT LONG TERM GOAL #6   Title  Pt. will write his name efficiently with 100% legibility    Baseline  01/14/2018 - ~80% legibility for name. Requires increased time to do so.    Time  8    Period  Weeks    Status  On-going    Target Date  03/11/18      OT LONG TERM GOAL #7   Title  Pt will independently and consistently follow HEP to increase UE strength to increase functional independence    Baseline  01/14/2018 - Pt does not have a HEP at this time    Time  6    Period  Weeks    Status  New    Target Date  02/25/18            Plan - 01/22/18 1505    Clinical Impression Statement  Pt. has progressed, and has no pain in his right shoulder. Pt. is able to wash dishes now. Pt. is able to stand, and side step with the walker to get higher up towads the head of the bed. Pt. reports that he is able to assist more with LE dressing tasks. Pt. continues to present with decreased strength, and impaired [facility] skills and conitnues to work on these skills to improve independnce with ADLs, and IADLs..    Occupational performance deficits (Please refer to evaluation for details):  ADL's;IADL's    Rehab Potential  Fair    OT Frequency  2x / week    OT Duration  12 weeks    OT Treatment/Interventions  Self-care/ADL training;Patient/family education;DME and/or AE instruction;Therapeutic  exercise;Moist Heat;Neuromuscular education    Clinical Decision Making  Several treatment options, min-mod task modification necessary    Consulted and Agree with Plan of Care  Patient       Patient will benefit from skilled therapeutic intervention in order to improve the following deficits and impairments:  Pain, Decreased cognition, Decreased activity tolerance, Impaired UE functional use, Decreased range of motion, Decreased coordination, Decreased endurance  Visit Diagnosis: Muscle weakness (generalized)    Problem List Patient Active Problem List   Diagnosis Date Noted  . Acute CVA (cerebrovascular accident) ([place]) 05/02/2016  . Left-sided weakness 05/01/2016    Harrel Carina, MS, OTR/L 01/22/2018, 4:47 PM  [place] MAIN [facility] SERVICES [address] [place], [place], [address] Phone: [phone]   Fax:  [phone]  Name: DEVERICK PRUSS MRN: [id number] Date of Birth: 1965/11/08

## 2018-01-27 ENCOUNTER — Ambulatory Visit: Payer: Medicare HMO

## 2018-01-27 ENCOUNTER — Ambulatory Visit: Payer: Medicare HMO | Admitting: Occupational Therapy

## 2018-01-27 ENCOUNTER — Encounter: Payer: Self-pay | Admitting: Occupational Therapy

## 2018-01-27 DIAGNOSIS — I69351 Hemiplegia and hemiparesis following cerebral infarction affecting right dominant side: Secondary | ICD-10-CM

## 2018-01-27 DIAGNOSIS — M6281 Muscle weakness (generalized): Secondary | ICD-10-CM

## 2018-01-27 DIAGNOSIS — R2689 Other abnormalities of gait and mobility: Secondary | ICD-10-CM

## 2018-01-27 DIAGNOSIS — R278 Other lack of coordination: Secondary | ICD-10-CM

## 2018-01-27 NOTE — Therapy (Addendum)
[place] MAIN [facility] SERVICES [address] [place], [place], [address] Phone: [phone]   Fax:  [phone]  Occupational Therapy Treatment  Patient Details  Name: Curtis Powell MRN: [id number] Date of Birth: 1966-01-05 Referring Provider (OT): Dr. Bobette Mo   Encounter Date: 01/27/2018  OT End of Session - 01/27/18 1530    Visit Number  60    Number of Visits  65    Date for OT Re-Evaluation  04/08/18    Authorization Type  Visit 3 of 10 for progress reporting period starting 01/20/2018    OT Start Time  1430    OT Stop Time  1515    OT Time Calculation (min)  45 min    Activity Tolerance  Patient tolerated treatment well    Behavior During Therapy  [employer] for tasks assessed/performed       Past Medical History:  Diagnosis Date  . GERD (gastroesophageal reflux disease)   . Hyperlipidemia   . Hypertension   . Obesity   . Stroke [facility])     Past Surgical History:  Procedure Laterality Date  . BRAIN SURGERY      There were no vitals filed for this visit.  Subjective Assessment - 01/27/18 1529    Subjective   Pt reports that he is doing well today and that he had a relaxing weekend.    Pertinent History  Pt. is a 52 y.o. male who suffered a CVA on 05/01/2016. Pt. was admitted to the hospital. Once discharged, he received Home Health PT and OT services for about a month. Pt. has had multiple CVAs over the past 8 years, and has had multiple falls in the past 6 months. Pt. resides in an apartment. Pt. has caregivers for 80 hours. Pt.'s mother stays with pt. at night, and assists with IADL tasks.      Patient Stated Goals  To be able to throw a ball and dribble a ball.    Currently in Pain?  No/denies    Pain Score  0-No pain      OT TREATMENT  Neuromuscular re-education:   Pt focused on improving [facility] with manipulating nuts and bolts positioned vertically. Pt was able to unscrew both the 1" and 1/2" nuts. Pt frequently dropped nuts  as he worked towards the top of the tower. Pt worked on picking up the nuts from the table and placing them back onto the bolt. Pt had more difficulty grasping and placing the nuts into position and alignment when keeping UE sustained in elevation. Pt required increased time and cues for hand control during the task. Pt reported increased hand fatigue after completing the task. Pt completed [facility] skills task that required him to place small pegs onto peg board. Pt demonstrated decreased [facility] skills when using the right hand compared to the left hand today. Pt completed task without dropping pegs, however required increased time. Pt reached across midline bilaterally to replace pegs into container.  Therapeutic exercise:   Pt performed 2# dowel exercises for BUE strengthening secondary to weakness. Bilateral shoulder flexion, chest press, and circular patterns were performed. 2# dumbbell exercises for elbow flexion and extension, forearm supination/pronation, and wrist flexion/extension. Pt required demonstration and verbal cues for proper technique.                     OT Education - 01/27/18 1530    Education provided  Yes    Education Details  UE strengthening, [facility] skills  Person(s) Educated  Patient    Methods  Explanation;Demonstration    Comprehension  Verbalized understanding;Returned demonstration;Verbal cues required       OT Short Term Goals - 03/03/17 1459      OT SHORT TERM GOAL #1   Title  `        OT Long Term Goals - 01/14/18 1351      OT LONG TERM GOAL #1   Title  Pt. will UE functioning to be able to perfrom toilet hygiene with minimal assistance.    Baseline  01/14/2018 - Pt completes toileting hygiene independently. Pt requires assistance managing LB clothing.    Time  12    Period  Weeks    Status  Achieved    Target Date  01/14/18      OT LONG TERM GOAL #2   Title  Pt. will complete self grooming with minA    Baseline  01/14/2018 - Pt is  independent with oral care and washing his face and head but requires assistance for shaving.    Time  12    Period  Weeks    Status  On-going    Target Date  04/08/18      OT LONG TERM GOAL #3   Title  Pt. will perform self-feeding skills with modified indepndence    Baseline  01/14/2018 - Pt is able to complete self-feeding independently with minimal spillage. Pt is unable to cut meat at this time.    Time  12    Period  Weeks    Status  Achieved    Target Date  01/14/18      OT LONG TERM GOAL #4   Title  Pt. will perform self dressing with minA and A/E as needed.    Baseline  01/14/2018 - Pt. continues to require extensive assist for LB dressing due to balance deficits: Pt. requires min A for UB dressing.    Time  12    Period  Weeks    Status  On-going    Target Date  04/08/18      OT LONG TERM GOAL #5   Title  Pt. will perform light home making tasks with minA    Baseline  01/14/2018 - Pt is able to wash dishes while seated in w/c with increased accessibility of new apartment. Pt does not complete laundry nor sweeping, vacuuming, and dusting.     Time  12    Period  Weeks    Status  Achieved    Target Date  01/14/18      Long Term Additional Goals   Additional Long Term Goals  Yes      OT LONG TERM GOAL #6   Title  Pt. will write his name efficiently with 100% legibility    Baseline  01/14/2018 - ~80% legibility for name. Requires increased time to do so.    Time  8    Period  Weeks    Status  On-going    Target Date  03/11/18      OT LONG TERM GOAL #7   Title  Pt will independently and consistently follow HEP to increase UE strength to increase functional independence    Baseline  01/14/2018 - Pt does not have a HEP at this time    Time  6    Period  Weeks    Status  New    Target Date  02/25/18            Plan -  01/27/18 1531    Clinical Impression Statement Pt presents with decreased [facility] skills and BUE strength. Pt has been working to improve BUE strength  and is no longer presenting with pain. Pt requires increased time for [facility] tasks including manipulating buttons, drawstrings, and pills during medication management. Further improvement in UE strength and [facility] skills promotes pt independence during ADLs.    Occupational performance deficits (Please refer to evaluation for details):  ADL's;IADL's    Rehab Potential  Fair    OT Frequency  2x / week    OT Duration  12 weeks    OT Treatment/Interventions  Self-care/ADL training;Patient/family education;DME and/or AE instruction;Therapeutic exercise;Moist Heat;Neuromuscular education    Clinical Decision Making  Several treatment options, min-mod task modification necessary    Consulted and Agree with Plan of Care  Patient       Patient will benefit from skilled therapeutic intervention in order to improve the following deficits and impairments:  Pain, Decreased cognition, Decreased activity tolerance, Impaired UE functional use, Decreased range of motion, Decreased coordination, Decreased endurance  Visit Diagnosis: Other lack of coordination  Muscle weakness (generalized)    Problem List Patient Active Problem List   Diagnosis Date Noted  . Acute CVA (cerebrovascular accident) ([place]) 05/02/2016  . Left-sided weakness 05/01/2016    Oliver Hum, OTS 01/27/2018, 3:40 PM   This entire session was performed under direct supervision and direction of a licensed therapist/therapist assistant . I have personally read, edited and approve of the note as written.  Harrel Carina, MS, OTR/L   [place] MAIN [facility] SERVICES [address] [place], [place], [address] Phone: [phone]   Fax:  [phone]  Name: Curtis Powell MRN: [id number] Date of Birth: [birth year]

## 2018-01-27 NOTE — Therapy (Addendum)
[place] MAIN [facility] SERVICES [address] [place], [place], [address] Phone: [phone]   Fax:  [phone]  Physical Therapy Treatment  Patient Details  Name: Curtis Powell MRN: [id number] Date of Birth: 09/19/1965 Referring Provider (PT): Dr. Arrie Aran Santaya   Encounter Date: 01/27/2018  PT End of Session - 01/27/18 1612    Visit Number  59    Number of Visits  31    Date for PT Re-Evaluation  02/12/18    Authorization Type  PN 2/10 begins 10/15     PT Start Time  1516    PT Stop Time  1600    PT Time Calculation (min)  44 min    Equipment Utilized During Treatment  Gait belt    Activity Tolerance  Patient tolerated treatment well    Behavior During Therapy  [facility] for tasks assessed/performed       Past Medical History:  Diagnosis Date  . GERD (gastroesophageal reflux disease)   . Hyperlipidemia   . Hypertension   . Obesity   . Stroke [facility])     Past Surgical History:  Procedure Laterality Date  . BRAIN SURGERY      There were no vitals filed for this visit.  Subjective Assessment - 01/27/18 1522    Subjective  Patient states he had a decent weekend. States he gets a pain on the edge of the of the L foot which is described as burning but only at night (2/10). States it will wake him up in his sleep.  Reports this started about 2 weeks ago.  States he is getting stronger and it is easier to stand up from seat.     Pertinent History   Patient is a pleasant 52 year old male who presents to physical therapy for weakness and immobility secondary to CVA.  Had a stroke in Feb 2018. He was previously fully independent, but this stroke caused severe residual deficits, mainly on the right side as well as speech, and now he is unable to walk or perform most of his ADLs on his own. Entire right side is very weak. He still has a little difficulty with speech but his swallowing is improved to baseline. His mom had to move in with him  and now is his main caregiver.     Limitations  Sitting;Lifting;Standing;Walking;House hold activities    How long can you sit comfortably?  5 minutes    How long can you stand comfortably?  5 minutes    How long can you walk comfortably?  10 ft    Patient Stated Goals  Walk without walker, walk further with walker, get some strength in back     Currently in Pain?  No/denies            Tranfer to and from nustep from wheelchair to RW via stand step pivot CGA   Nustep lvl 4 4 minutes >60rpm for cardiovascular challenge and speed for motor initiation.    In //bars:    Modified tandem stance with each leg shifting weight anteriorly to promote glute activation in front of mirror. Tactile cueing at ASIS and PSIS for appropriate anterior weight shift. CGA approximately 6 minutes   Modified tandem stance with each leg weight shifting anteriorly and posteriorly with tactile cueing at ASIS when in appropriate anterior weight shift position to step. Completed 2x lengths of //bars. CGA 1seated rest break    Seated marches x15 each leg. Verbal cues to increase  speed of movement to increase height of movements for carryover to ambulatory mechanics    Seated LAQ x15 each LE. Verbal cues to increase speed of movement for carryover to gait mechanics and velocity.   Bathroom mobility performed at beginning of session: addressed standing balance and mobility                      PT Education - 01/27/18 1611    Education provided  Yes    Education Details  exercise technique, HEP, adding speed to movements    Person(s) Educated  Patient    Methods  Explanation;Demonstration;Verbal cues    Comprehension  Verbalized understanding;Returned demonstration       PT Short Term Goals - 01/20/18 1501      PT SHORT TERM GOAL #1   Title  Patient will be independent in home exercise program to improve strength/mobility for better functional independence with ADLs.    Baseline  hep  compliant    Time  2    Period  Weeks    Status  Achieved      PT SHORT TERM GOAL #2   Title  Patient will require min cueing for STS transfer with CGA for increased independence with mobility.     Baseline  CGA    Time  2    Period  Weeks    Status  Achieved      PT SHORT TERM GOAL #3   Title  Patient will maintain upright posture for > 15 seconds to demonstrate strengthened postural control muscualture     Baseline  18 seconds     Time  2    Period  Weeks    Status  Achieved        PT Long Term Goals - 01/22/18 1619      PT LONG TERM GOAL #1   Title  Patient will perform bed mobility with Supervision to increase independent mobility    Baseline  able to perform mod I for increased time , occasional Min A for scooting up in bed; 7/16: min guard for safety with increased effort and time, cues to scoot back in bed before sit>supine 9/12: can do it independently but with difficulty    Time  8    Period  Weeks    Status  Achieved      PT LONG TERM GOAL #2   Title  Patient (< 68 years old) will complete five times sit to stand test in < 10 seconds indicating an increased LE strength and improved balance.    Baseline  2/18: 30 seconds from plinth chair height single UE on seat and single on walker ; 32 seconds from plinth with single UE on seat and single on walker. 4/10: 26 seconds from [facility] with one hand on walker5/29: 22.2 seconds with one hand on walker one hand on WC; 7/16: one hand on walker one hand on WC 18.96 seconds; pt achieving 75% of full upright standing 8/22:one hand on walker one hand on WC 18 seconds; pt achieving 75% of full upright standing  9/12: 18 seconds econds; pt achieving 75% of full upright standing 8/22:one hand on walker one hand on WC 10/15: 17seconds; pt achieving 75% of full upright posture    Time  8    Period  Weeks    Status  Partially Met      PT LONG TERM GOAL #3   Title  Patient will ambulate 40 ft with walker  with no breaks to allow for increased  mobility within home.     Baseline  12/26: 29 ft with walker and WC follow with no rest breaks; 2/18: multiple trials previous session of >100 ft     Time  8    Period  Weeks    Status  Achieved      PT LONG TERM GOAL #4   Title  Patient will increase BLE gross strength to 4+/5 as to improve functional strength for independent gait, increased standing tolerance and increased ADL ability.    Baseline   4-/5 RLE, 10/15: 4/5 RLE    Time  8    Period  Weeks    Status  Partially Met      PT LONG TERM GOAL #5   Title  Patient will increase lower extremity functional scale to >40/80 to demonstrate improved functional mobility and increased tolerance with ADLs.     Baseline  14/80; 12/26: 21/80; 2/18: 28/80  4/10; 25/80 5/29: 27/80; 7/16: 29/80 8/22: 30/80  9/12: 26/80 (feeling more tired due to moving) 10/15: 37/80    Time  8    Period  Weeks    Status  Partially Met      PT LONG TERM GOAL #6   Title  Patient will walk through a door involving one small step up/down to increase functional independence.     Baseline  require CGA  BUE support in // bars at this time 10/15: required BUE and CGA in //bars    Time  8    Period  Weeks    Status  Partially Met      PT LONG TERM GOAL #7   Title  Patient will ambulate 40 ft with least assistive device and Supervision with no breaks to allow for increased mobility within home.     Baseline  requires use of walker and CGA     Time  8    Period  Weeks    Status  Achieved      PT LONG TERM GOAL #8   Title  Patient will perform 10 MWT in < 1 minute to demonstrate improved ambulatory velocity and capacity.     Baseline  4/10: 2 minutes 45 seconds; 5/29: 2 minute 31 seconds; 3 minutes 22 seconds 8/22: 3 minutes 2 seconds  9/12:  2 minutes and 43 seconds 10/15: 43mnute 12 seconds    Time  8    Period  Weeks    Status  Partially Met      PT LONG TERM GOAL  #9   TITLE  Patient will increase Berg Balance score by > 6 points (26/56) to demonstrate  decreased fall risk during functional activities.    Baseline  9/12: 20/56 10/15: 21/56    Time  8    Period  Weeks    Status  Partially Met      PT LONG TERM GOAL  #10   TITLE  Patient (< 620years old) will complete five times sit to stand test with full upright posture in < 30 seconds indicating an increased glute activation, LE strength and improved balance.    Baseline  10/15: 37seconds    Time  8    Period  Weeks    Status  New      PT LONG TERM GOAL  #11   TITLE  Patient will ambulate 100 ft with least assistive device and Supervision with no breaks to allow for increased mobility within home.  Baseline  10/17: 34f    Time  8    Period  Weeks    Status  New    Target Date  03/19/18            Plan - 01/27/18 1615    Clinical Impression Statement  Patient demonstrated improved ability to complete modified tandem stance with appropriate anterior/posterior weight shift with heel off and toe off. Progressed to taking steps when in anterior weight shift position with upright posture and improved step length. Cueing was decreased with duration of activity with patient demonstrating recognition of body position and weight shift prior to stepping. Patient subjectively reports improvement with strength and benefit of completing sit to stands. Patient will continue to benefit from skilled physical therapy to improve strength, balance, and functional mobility.    Clinical Impairments Affecting Rehab Potential  hx of HTN, HLD, CVA, learning disability, Diabetes, brain tumor,     PT Frequency  2x / week    PT Duration  8 weeks    PT Treatment/Interventions  ADLs/Self Care Home Management;Aquatic Therapy;Ultrasound;Moist Heat;Traction;DME Instruction;Gait training;Stair training;Functional mobility training;Therapeutic activities;Therapeutic exercise;Orthotic Fit/Training;Neuromuscular re-education;Balance training;Patient/family education;Manual techniques;Wheelchair mobility  training;Passive range of motion;Energy conservation;Taping;Visual/perceptual remediation/compensation    PT Next Visit Plan  balance and stability and ambulation      PT Home Exercise Plan  TrA contraction     Consulted and Agree with Plan of Care  Patient       Patient will benefit from skilled therapeutic intervention in order to improve the following deficits and impairments:  Abnormal gait, Decreased activity tolerance, Decreased balance, Decreased knowledge of precautions, Decreased endurance, Decreased coordination, Decreased knowledge of use of DME, Decreased mobility, Decreased range of motion, Difficulty walking, Decreased safety awareness, Decreased strength, Impaired flexibility, Impaired perceived functional ability, Impaired tone, Postural dysfunction, Improper body mechanics, Pain  Visit Diagnosis: Other lack of coordination  Muscle weakness (generalized)  Other abnormalities of gait and mobility  Hemiplegia and hemiparesis following cerebral infarction affecting right dominant side ([facility]     Problem List Patient Active Problem List   Diagnosis Date Noted  . [place] 05/02[place]  . Left-sided weakness 05/01/2016   AErick Blinks SPT  This entire session was performed under direct supervision and direction of a licensed therapist/therapist assistant . I have personally read, edited and approve of the note as written.  MJanna Arch PT, DPT   01/28/2018, 8:43 AM  [place] [facility] [address] [address]Phone: [phone]  Fax:  [phone] Name: BIZYK MARTYMRN: [id number] of Birth: [birth year]

## 2018-01-29 ENCOUNTER — Ambulatory Visit: Payer: Medicare HMO | Admitting: Occupational Therapy

## 2018-01-29 ENCOUNTER — Ambulatory Visit: Payer: Medicare HMO

## 2018-01-29 ENCOUNTER — Encounter: Payer: Self-pay | Admitting: Occupational Therapy

## 2018-01-29 DIAGNOSIS — M6281 Muscle weakness (generalized): Secondary | ICD-10-CM

## 2018-01-29 DIAGNOSIS — R278 Other lack of coordination: Secondary | ICD-10-CM | POA: Diagnosis not present

## 2018-01-29 DIAGNOSIS — I69351 Hemiplegia and hemiparesis following cerebral infarction affecting right dominant side: Secondary | ICD-10-CM

## 2018-01-29 DIAGNOSIS — R2689 Other abnormalities of gait and mobility: Secondary | ICD-10-CM

## 2018-01-29 NOTE — Therapy (Addendum)
[place] MAIN [facility] SERVICES [address] [place], [place], [address] Phone: [phone]   Fax:  [phone]  Physical Therapy Treatment  Patient Details  Name: Curtis Powell MRN: [id number] Date of Birth: [birth year] Referring Provider (PT): Dr. Arrie Aran Santaya   Encounter Date: 01/29/2018  PT End of Session - 01/29/18 1537    Visit Number  2    Number of Visits  15    Date for PT Re-Evaluation  02/12/18    Authorization Type  PN 3/10 begins 10/15     PT Start Time  1516    PT Stop Time  1600    PT Time Calculation (min)  44 min    Equipment Utilized During Treatment  Gait belt    Activity Tolerance  Patient tolerated treatment well    Behavior During Therapy  [facility] for tasks assessed/performed       Past Medical History:  Diagnosis Date  . GERD (gastroesophageal reflux disease)   . Hyperlipidemia   . Hypertension   . Obesity   . Stroke [facility])     Past Surgical History:  Procedure Laterality Date  . BRAIN SURGERY      There were no vitals filed for this visit.  Subjective Assessment - 01/29/18 1520    Subjective  Patient states he is still getting burning pain at night in L and R feet. States he has been walking around his house and doing his exercises. No falls since last visit.     Pertinent History   Patient is a pleasant 52 year old male who presents to physical therapy for weakness and immobility secondary to CVA.  Had a stroke in Feb 2018. He was previously fully independent, but this stroke caused severe residual deficits, mainly on the right side as well as speech, and now he is unable to walk or perform most of his ADLs on his own. Entire right side is very weak. He still has a little difficulty with speech but his swallowing is improved to baseline. His mom had to move in with him and now is his main caregiver.     Limitations  Sitting;Lifting;Standing;Walking;House hold activities    How long can you sit  comfortably?  5 minutes    How long can you stand comfortably?  5 minutes    How long can you walk comfortably?  10 ft    Patient Stated Goals  Walk without walker, walk further with walker, get some strength in back     Currently in Pain?  No/denies            Tranfer to and from nustep from wheelchair to RW via stand step pivot CGA   Nustep lvl 4 4 minutes >60rpm for cardiovascular challenge and speed for motor initiation.    Ambulate in hallway with RW and CGA  with WC follow, Verbal cues for anterior weight shift prior to each step with glute activation and upright posture. Occasional cueing for increased step height for foot clearance. Tactile cues for gluteal activation to improve step length and RLE stance time. Patient has decreased velocity resulting in prolonged timed needed for distance ambulation. x95f     In //bars:    Modified tandem stance with each leg shifting weight anteriorly to promote glute activation in front of mirror. Tactile cueing at ASIS and PSIS for appropriate anterior weight shift. CGA approximately 6 minutes    Seated marches2 x15 each leg. Verbal cues to increase speed  of movement to increase height of movements for carryover to ambulatory mechanics. Completed with metronome at speed of 60.    Seated LAQ 2x15 each LE. Verbal cues to increase speed of movement for carryover to gait mechanics and velocity. Speed of 30. Completed 1 leg at a time. 2nd set reciprocal movement   Bathroom mobility performed at beginning of session: addressed standing balance and mobility                      PT Education - 01/29/18 1523    Education provided  Yes    Education Details  exercise technique, HEP     Person(s) Educated  Patient    Methods  Explanation;Demonstration;Verbal cues    Comprehension  Verbalized understanding;Returned demonstration       PT Short Term Goals - 01/20/18 1501      PT SHORT TERM GOAL #1   Title  Patient will be  independent in home exercise program to improve strength/mobility for better functional independence with ADLs.    Baseline  hep compliant    Time  2    Period  Weeks    Status  Achieved      PT SHORT TERM GOAL #2   Title  Patient will require min cueing for STS transfer with CGA for increased independence with mobility.     Baseline  CGA    Time  2    Period  Weeks    Status  Achieved      PT SHORT TERM GOAL #3   Title  Patient will maintain upright posture for > 15 seconds to demonstrate strengthened postural control muscualture     Baseline  18 seconds     Time  2    Period  Weeks    Status  Achieved        PT Long Term Goals - 01/22/18 1619      PT LONG TERM GOAL #1   Title  Patient will perform bed mobility with Supervision to increase independent mobility    Baseline  able to perform mod I for increased time , occasional Min A for scooting up in bed; 7/16: min guard for safety with increased effort and time, cues to scoot back in bed before sit>supine 9/12: can do it independently but with difficulty    Time  8    Period  Weeks    Status  Achieved      PT LONG TERM GOAL #2   Title  Patient (< 56 years old) will complete five times sit to stand test in < 10 seconds indicating an increased LE strength and improved balance.    Baseline  2/18: 30 seconds from plinth chair height single UE on seat and single on walker ; 32 seconds from plinth with single UE on seat and single on walker. 4/10: 26 seconds from [facility] with one hand on walker5/29: 22.2 seconds with one hand on walker one hand on WC; 7/16: one hand on walker one hand on WC 18.96 seconds; pt achieving 75% of full upright standing 8/22:one hand on walker one hand on WC 18 seconds; pt achieving 75% of full upright standing  9/12: 18 seconds econds; pt achieving 75% of full upright standing 8/22:one hand on walker one hand on WC 10/15: 17seconds; pt achieving 75% of full upright posture    Time  8    Period  Weeks    Status   Partially Met  PT LONG TERM GOAL #3   Title  Patient will ambulate 40 ft with walker with no breaks to allow for increased mobility within home.     Baseline  12/26: 29 ft with walker and WC follow with no rest breaks; 2/18: multiple trials previous session of >100 ft     Time  8    Period  Weeks    Status  Achieved      PT LONG TERM GOAL #4   Title  Patient will increase BLE gross strength to 4+/5 as to improve functional strength for independent gait, increased standing tolerance and increased ADL ability.    Baseline   4-/5 RLE, 10/15: 4/5 RLE    Time  8    Period  Weeks    Status  Partially Met      PT LONG TERM GOAL #5   Title  Patient will increase lower extremity functional scale to >40/80 to demonstrate improved functional mobility and increased tolerance with ADLs.     Baseline  14/80; 12/26: 21/80; 2/18: 28/80  4/10; 25/80 5/29: 27/80; 7/16: 29/80 8/22: 30/80  9/12: 26/80 (feeling more tired due to moving) 10/15: 37/80    Time  8    Period  Weeks    Status  Partially Met      PT LONG TERM GOAL #6   Title  Patient will walk through a door involving one small step up/down to increase functional independence.     Baseline  require CGA  BUE support in // bars at this time 10/15: required BUE and CGA in //bars    Time  8    Period  Weeks    Status  Partially Met      PT LONG TERM GOAL #7   Title  Patient will ambulate 40 ft with least assistive device and Supervision with no breaks to allow for increased mobility within home.     Baseline  requires use of walker and CGA     Time  8    Period  Weeks    Status  Achieved      PT LONG TERM GOAL #8   Title  Patient will perform 10 MWT in < 1 minute to demonstrate improved ambulatory velocity and capacity.     Baseline  4/10: 2 minutes 45 seconds; 5/29: 2 minute 31 seconds; 3 minutes 22 seconds 8/22: 3 minutes 2 seconds  9/12:  2 minutes and 43 seconds 10/15: 74mnute 12 seconds    Time  8    Period  Weeks    Status   Partially Met      PT LONG TERM GOAL  #9   TITLE  Patient will increase Berg Balance score by > 6 points (26/56) to demonstrate decreased fall risk during functional activities.    Baseline  9/12: 20/56 10/15: 21/56    Time  8    Period  Weeks    Status  Partially Met      PT LONG TERM GOAL  #10   TITLE  Patient (< 640years old) will complete five times sit to stand test with full upright posture in < 30 seconds indicating an increased glute activation, LE strength and improved balance.    Baseline  10/15: 37seconds    Time  8    Period  Weeks    Status  New      PT LONG TERM GOAL  #11   TITLE  Patient will ambulate 100 ft with  least assistive device and Supervision with no breaks to allow for increased mobility within home.     Baseline  10/17: 3f    Time  8    Period  Weeks    Status  New    Target Date  03/19/18            Plan - 01/29/18 1606    Clinical Impression Statement  Patient performed modified tandem stance with improved anterior weight with minimal cueing required as pre gait activity. Patient demonstrated carryover following pre gait activity with improved anterior weight shift and upright posture while ambulating leading to increased step length and improved gait mechanics. Patient challenged with reciprocal movement during LAQ. Patient will continue to benefit from skilled physical therapy to improve strength, balance, and functional mobility.     Clinical Impairments Affecting Rehab Potential  hx of HTN, HLD, CVA, learning disability, Diabetes, brain tumor,     PT Frequency  2x / week    PT Duration  8 weeks    PT Treatment/Interventions  ADLs/Self Care Home Management;Aquatic Therapy;Ultrasound;Moist Heat;Traction;DME Instruction;Gait training;Stair training;Functional mobility training;Therapeutic activities;Therapeutic exercise;Orthotic Fit/Training;Neuromuscular re-education;Balance training;Patient/family education;Manual techniques;Wheelchair mobility  training;Passive range of motion;Energy conservation;Taping;Visual/perceptual remediation/compensation    PT Next Visit Plan  balance and stability and ambulation      PT Home Exercise Plan  TrA contraction     Consulted and Agree with Plan of Care  Patient       Patient will benefit from skilled therapeutic intervention in order to improve the following deficits and impairments:  Abnormal gait, Decreased activity tolerance, Decreased balance, Decreased knowledge of precautions, Decreased endurance, Decreased coordination, Decreased knowledge of use of DME, Decreased mobility, Decreased range of motion, Difficulty walking, Decreased safety awareness, Decreased strength, Impaired flexibility, Impaired perceived functional ability, Impaired tone, Postural dysfunction, Improper body mechanics, Pain  Visit Diagnosis: Muscle weakness (generalized)  Other lack of coordination  Other abnormalities of gait and mobility  Hemiplegia and hemiparesis following cerebral infarction affecting right dominant side ([facility]     Problem List Patient Active Problem List   Diagnosis Date Noted  . Acute [place] (ce[place]brovascular accident) ([place] 05/02/2016  . Left-sided weakness 05/01/2016   AVenita Lick This entire session was performed under direct supervision and direction of a licensed therapist/therapist assistant . I have personally read, edited and approve of the note as written.  MJanna Arch PT, DPT   01/30/2018, 8:40 AM  [place] ForestMAIN [facility] SystemSERVICES [address] [place] [address]Phone: [phone]  Fax:  [phone] Name: BPATRIK TURNBAUGHMRN: [id number] of Birth: [birth year]

## 2018-01-29 NOTE — Therapy (Addendum)
[name] MAIN [facility] SERVICES [address] [place], [place], [address] Phone: [phone]   Fax:  [phone]  Occupational Therapy Treatment  Patient Details  Name: Curtis Powell MRN: [id number] Date of Birth: 06-06-1965 Referring Provider (OT): Dr. Bobette Mo   Encounter Date: 01/29/2018  OT End of Session - 01/29/18 1453    Visit Number  61    Number of Visits  65    Date for OT Re-Evaluation  04/08/18    Authorization Type  Visit 4 of 10 for progress reporting period starting 01/20/2018    OT Start Time  1430    OT Stop Time  1515    OT Time Calculation (min)  45 min    Activity Tolerance  Patient tolerated treatment well    Behavior During Therapy  [facility] for tasks assessed/performed       Past Medical History:  Diagnosis Date  . GERD (gastroesophageal reflux disease)   . Hyperlipidemia   . Hypertension   . Obesity   . Stroke [facility])     Past Surgical History:  Procedure Laterality Date  . BRAIN SURGERY      There were no vitals filed for this visit.  Subjective Assessment - 01/29/18 1447    Subjective   Pt reports that he is doing well and has noticed that he has been able to do more when getting dressed.    Pertinent History  Pt. is a 52 y.o. male who suffered a CVA on 05/01/2016. Pt. was admitted to the hospital. Once discharged, he received Home Health PT and OT services for about a month. Pt. has had multiple CVAs over the past 8 years, and has had multiple falls in the past 6 months. Pt. resides in an apartment. Pt. has caregivers for 80 hours. Pt.'s mother stays with pt. at night, and assists with IADL tasks.      Patient Stated Goals  To be able to throw a ball and dribble a ball.    Currently in Pain?  Yes    Pain Score  5     Pain Location  Shoulder    Pain Orientation  Right    Pain Descriptors / Indicators  Aching    Pain Type  Chronic pain    Pain Onset  More than a month ago    Pain Frequency  Intermittent    Multiple Pain Sites  No       OT TREATMENT Therapeutic exercise:   Pt performed red theraband exercises for BUE strengthening secondary to weakness. Bilateral chest press, triceps, and bicep curls were performed for 2 sets of 10 reps each. Pt required demonstration and verbal cues for proper technique. Pt. performed gross gripping with grip strengthener. Pt. worked on sustaining grip while grasping pegs and reaching at various heights with the LUE, and flat at the tabletop with the right. Gripper was placed in the 3rd resistive slot with the Arliss Hepburn resistive spring. Pt frequently dropped pegs when using the RUE and reported shoulder pain when reaching to replace in container. Pt completed PVC pipe tree task that required him to assemble pieces of various sizes according to a template. Pt was instructed to firmly assemble pieces to prevent tower from falling. Pt required min A to assemble pieces at the top of the tower due to decreased strength with UE sustained above head. Pt used a gross grasp with a twisting/wringing motion to disassemble pieces. Pt required increased time and effort to disassemble pieces  after being firmly secured by therapist.                     OT Education - 01/29/18 1453    Education provided  Yes    Education Details  BUE strengthening, [facility] skills    Person(s) Educated  Patient    Methods  Explanation;Demonstration    Comprehension  Verbalized understanding;Returned demonstration;Verbal cues required       OT Short Term Goals - 03/03/17 1459      OT SHORT TERM GOAL #1   Title  `        OT Long Term Goals - 01/14/18 1351      OT LONG TERM GOAL #1   Title  Pt. will UE functioning to be able to perfrom toilet hygiene with minimal assistance.    Baseline  01/14/2018 - Pt completes toileting hygiene independently. Pt requires assistance managing LB clothing.    Time  12    Period  Weeks    Status  Achieved    Target Date  01/14/18      OT LONG  TERM GOAL #2   Title  Pt. will complete self grooming with minA    Baseline  01/14/2018 - Pt is independent with oral care and washing his face and head but requires assistance for shaving.    Time  12    Period  Weeks    Status  On-going    Target Date  04/08/18      OT LONG TERM GOAL #3   Title  Pt. will perform self-feeding skills with modified indepndence    Baseline  01/14/2018 - Pt is able to complete self-feeding independently with minimal spillage. Pt is unable to cut meat at this time.    Time  12    Period  Weeks    Status  Achieved    Target Date  01/14/18      OT LONG TERM GOAL #4   Title  Pt. will perform self dressing with minA and A/E as needed.    Baseline  01/14/2018 - Pt. continues to require extensive assist for LB dressing due to balance deficits: Pt. requires min A for UB dressing.    Time  12    Period  Weeks    Status  On-going    Target Date  04/08/18      OT LONG TERM GOAL #5   Title  Pt. will perform light home making tasks with minA    Baseline  01/14/2018 - Pt is able to wash dishes while seated in w/c with increased accessibility of new apartment. Pt does not complete laundry nor sweeping, vacuuming, and dusting.     Time  12    Period  Weeks    Status  Achieved    Target Date  01/14/18      Long Term Additional Goals   Additional Long Term Goals  Yes      OT LONG TERM GOAL #6   Title  Pt. will write his name efficiently with 100% legibility    Baseline  01/14/2018 - ~80% legibility for name. Requires increased time to do so.    Time  8    Period  Weeks    Status  On-going    Target Date  03/11/18      OT LONG TERM GOAL #7   Title  Pt will independently and consistently follow HEP to increase UE strength to increase functional independence  Baseline  01/14/2018 - Pt does not have a HEP at this time    Time  6    Period  Weeks    Status  New    Target Date  02/25/18            Plan - 01/29/18 1454    Clinical Impression Statement  Pt  presents with decreased [facility] skills and BUE strength. Pt has been working to improve BUE strength and presents with pain today. Pt demonstrates decreased BUE strength when keeping UE sustained in elevation. Pt requires increased time for [facility] tasks including manipulating buttons, drawstrings, and pills during medication management. Pt was able to don shirt, socks, shoes, and jacket while getting dressed for treatment today. Further improvement in UE strength and [facility] skills promotes pt independence during ADLs.    Occupational performance deficits (Please refer to evaluation for details):  ADL's;IADL's    Rehab Potential  Fair    OT Frequency  2x / week    OT Duration  12 weeks    OT Treatment/Interventions  Self-care/ADL training;Patient/family education;DME and/or AE instruction;Therapeutic exercise;Moist Heat;Neuromuscular education    Clinical Decision Making  Several treatment options, min-mod task modification necessary    Consulted and Agree with Plan of Care  Patient       Patient will benefit from skilled therapeutic intervention in order to improve the following deficits and impairments:  Pain, Decreased cognition, Decreased activity tolerance, Impaired UE functional use, Decreased range of motion, Decreased coordination, Decreased endurance  Visit Diagnosis: Muscle weakness (generalized)  Other lack of coordination    Problem List Patient Active Problem List   Diagnosis Date Noted  . Acute CVA (cerebrovascular accident) ([place]) 05/02/2016  . Left-sided weakness 05/01/2016    Oliver Hum, OTS 01/29/2018, 3:08 PM   This entire session was performed under direct supervision and direction of a licensed therapist/therapist assistant . I have personally read, edited and approve of the note as written.  Harrel Carina, MS, OTR/L   [place] MAIN [facility] SERVICES [address] [place], [place], [address] Phone: [phone]   Fax:   [phone]  Name: Curtis Powell MRN: [id number] Date of Birth: 1966/03/11

## 2018-02-03 ENCOUNTER — Encounter: Payer: Self-pay | Admitting: Occupational Therapy

## 2018-02-03 ENCOUNTER — Ambulatory Visit: Payer: Medicare HMO | Admitting: Occupational Therapy

## 2018-02-03 ENCOUNTER — Ambulatory Visit: Payer: Medicare HMO

## 2018-02-03 DIAGNOSIS — R278 Other lack of coordination: Secondary | ICD-10-CM

## 2018-02-03 DIAGNOSIS — I69351 Hemiplegia and hemiparesis following cerebral infarction affecting right dominant side: Secondary | ICD-10-CM

## 2018-02-03 DIAGNOSIS — M6281 Muscle weakness (generalized): Secondary | ICD-10-CM

## 2018-02-03 DIAGNOSIS — R2689 Other abnormalities of gait and mobility: Secondary | ICD-10-CM

## 2018-02-03 NOTE — Therapy (Addendum)
[place] MAIN [facility] SERVICES [address] [place], [place], [address] Phone: [phone]   Fax:  [phone]  Occupational Therapy Treatment  Patient Details  Name: Curtis Powell MRN: [id number] Date of Birth: September 18, 1965 Referring Provider (OT): Dr. Bobette Mo   Encounter Date: 02/03/2018  OT End of Session - 02/03/18 1633    Visit Number  21    Number of Visits  65    Date for OT Re-Evaluation  04/08/18    Authorization Type  Visit 5 of 10 for progress reporting period starting 01/20/2018    OT Start Time  1442    OT Stop Time  1515    OT Time Calculation (min)  33 min    Activity Tolerance  Patient tolerated treatment well    Behavior During Therapy  [facility] for tasks assessed/performed       Past Medical History:  Diagnosis Date  . GERD (gastroesophageal reflux disease)   . Hyperlipidemia   . Hypertension   . Obesity   . Stroke [facility])     Past Surgical History:  Procedure Laterality Date  . BRAIN SURGERY      There were no vitals filed for this visit.  Subjective Assessment - 02/03/18 1630    Subjective   Pt reports that he is having a good day.    Pertinent History  Pt. is a 52 y.o. male who suffered a CVA on 05/01/2016. Pt. was admitted to the hospital. Once discharged, he received Home Health PT and OT services for about a month. Pt. has had multiple CVAs over the past 8 years, and has had multiple falls in the past 6 months. Pt. resides in an apartment. Pt. has caregivers for 80 hours. Pt.'s mother stays with pt. at night, and assists with IADL tasks.      Patient Stated Goals  To be able to throw a ball and dribble a ball.    Currently in Pain?  No/denies    Pain Score  0-No pain    Multiple Pain Sites  No      OT TREATMENT  Therapeutic exercise:   Pt. worked on pinch strengthening in the left hand for lateral, and 3pt. pinch using red, green, and blue resistive clips. Pt. worked on placing the clips at a vertical  angle. Tactile and verbal cues were required for eliciting the desired movement. Pt demonstrated increased hand fatigue as the task progressed and required x2 short rest breaks. Pt demonstrated increased signs of exertion when reaching higher above head.  Pt. worked on grasping large flat shapes using the right hand and moving them through varying heights of vertical dowels. Pt was able to place and remove shapes on the 2 shortest dowels. Pt required increased time to remove shapes from the 2nd tallest dowel. Pt required x2 short rest breaks and dropped x3 shapes. Pt required verbal cuing to place shape onto dowel instead of dropping shapes from the top.                     OT Education - 02/03/18 1633    Education provided  Yes    Education Details  BUE strengthening, functional reaching    Person(s) Educated  Patient    Methods  Explanation;Demonstration    Comprehension  Verbalized understanding;Returned demonstration;Verbal cues required       OT Short Term Goals - 03/03/17 1459      OT SHORT TERM GOAL #1   Title  `  OT Long Term Goals - 01/14/18 1351      OT LONG TERM GOAL #1   Title  Pt. will UE functioning to be able to perfrom toilet hygiene with minimal assistance.    Baseline  01/14/2018 - Pt completes toileting hygiene independently. Pt requires assistance managing LB clothing.    Time  12    Period  Weeks    Status  Achieved    Target Date  01/14/18      OT LONG TERM GOAL #2   Title  Pt. will complete self grooming with minA    Baseline  01/14/2018 - Pt is independent with oral care and washing his face and head but requires assistance for shaving.    Time  12    Period  Weeks    Status  On-going    Target Date  04/08/18      OT LONG TERM GOAL #3   Title  Pt. will perform self-feeding skills with modified indepndence    Baseline  01/14/2018 - Pt is able to complete self-feeding independently with minimal spillage. Pt is unable to cut meat at  this time.    Time  12    Period  Weeks    Status  Achieved    Target Date  01/14/18      OT LONG TERM GOAL #4   Title  Pt. will perform self dressing with minA and A/E as needed.    Baseline  01/14/2018 - Pt. continues to require extensive assist for LB dressing due to balance deficits: Pt. requires min A for UB dressing.    Time  12    Period  Weeks    Status  On-going    Target Date  04/08/18      OT LONG TERM GOAL #5   Title  Pt. will perform light home making tasks with minA    Baseline  01/14/2018 - Pt is able to wash dishes while seated in w/c with increased accessibility of new apartment. Pt does not complete laundry nor sweeping, vacuuming, and dusting.     Time  12    Period  Weeks    Status  Achieved    Target Date  01/14/18      Long Term Additional Goals   Additional Long Term Goals  Yes      OT LONG TERM GOAL #6   Title  Pt. will write his name efficiently with 100% legibility    Baseline  01/14/2018 - ~80% legibility for name. Requires increased time to do so.    Time  8    Period  Weeks    Status  On-going    Target Date  03/11/18      OT LONG TERM GOAL #7   Title  Pt will independently and consistently follow HEP to increase UE strength to increase functional independence    Baseline  01/14/2018 - Pt does not have a HEP at this time    Time  6    Period  Weeks    Status  New    Target Date  02/25/18            Plan - 02/03/18 1633    Clinical Impression Statement  Pt presents with decreased BUE strength while keeping arms sustained in elevation above head. Pt worked on tasks at an elevated surface while staying seated in his w/c to work on keeping arms sustained above head. Pt demonstrated increased signs of exertion and required frequent  rest breaks. BUE strengthening to sustain arms in elevation is required during functional tasks such as storing groceries, reaching for clothes in the closet, and light house cleaning from w/c level.    Occupational  performance deficits (Please refer to evaluation for details):  ADL's;IADL's    Rehab Potential  Fair    OT Frequency  2x / week    OT Duration  12 weeks    OT Treatment/Interventions  Self-care/ADL training;Patient/family education;DME and/or AE instruction;Therapeutic exercise;Moist Heat;Neuromuscular education    Clinical Decision Making  Several treatment options, min-mod task modification necessary    Consulted and Agree with Plan of Care  Patient       Patient will benefit from skilled therapeutic intervention in order to improve the following deficits and impairments:  Pain, Decreased cognition, Decreased activity tolerance, Impaired UE functional use, Decreased range of motion, Decreased coordination, Decreased endurance  Visit Diagnosis: Muscle weakness (generalized)    Problem List Patient Active Problem List   Diagnosis Date Noted  . Acute CVA (cerebrovascular accident) ([place]) 05/02/2016  . Left-sided weakness 05/01/2016    Oliver Hum, OTS 02/03/2018, 4:38 PM   This entire session was performed under direct supervision and direction of a licensed therapist/therapist assistant . I have personally read, edited and approve of the note as written.  Harrel Carina, MS, OTR/L   [place] MAIN [facility] SERVICES [address] [place], [place], [address] Phone: [phone]   Fax:  [phone]  Name: Curtis Powell MRN: [id number] Date of Birth: 1965-08-31

## 2018-02-03 NOTE — Therapy (Addendum)
[place] MA[facility] SERVICES [address] [place], [place], [address] Phone: [phone]   Fax:  [phone]  Physical Therapy Treatment  Patient Details  Name: Curtis Powell MRN: [id number] Date of Birth: 1965/08/15 Referring Provider (PT): Dr. Arrie Aran Santaya   Encounter Date: 02/03/2018  PT End of Session - 02/03/18 1525    Visit Number  17    Number of Visits  76    Date for PT Re-Evaluation  02/12/18    Authorization Type  PN 4/10 begins 10/15     PT Start Time  1516    PT Stop Time  1603    PT Time Calculation (min)  47 min    Equipment Utilized During Treatment  Gait belt    Activity Tolerance  Patient tolerated treatment well    Behavior During Therapy  [facility] for tasks assessed/performed       Past Medical History:  Diagnosis Date  . GERD (gastroesophageal reflux disease)   . Hyperlipidemia   . Hypertension   . Obesity   . Stroke [facility])     Past Surgical History:  Procedure Laterality Date  . BRAIN SURGERY      There were no vitals filed for this visit.  Subjective Assessment - 02/03/18 1522    Subjective  Patient states he went to the doctor yesterday and his sugar levels went down. States he did talk to doctor about burning pain in feet. Reports he has been walking at home. No falls since last visit.     Pertinent History   Patient is a pleasant 52 year old male who presents to physical therapy for weakness and immobility secondary to CVA.  Had a stroke in Feb 2018. He was previously fully independent, but this stroke caused severe residual deficits, mainly on the right side as well as speech, and now he is unable to walk or perform most of his ADLs on his own. Entire right side is very weak. He still has a little difficulty with speech but his swallowing is improved to baseline. His mom had to move in with him and now is his main caregiver.     Limitations  Sitting;Lifting;Standing;Walking;House hold activities     How long can you sit comfortably?  5 minutes    How long can you stand comfortably?  5 minutes    How long can you walk comfortably?  10 ft    Patient Stated Goals  Walk without walker, walk further with walker, get some strength in back     Currently in Pain?  No/denies             Tranfer to and from nustep from wheelchair to RW via stand step pivot CGA   Nustep lvl 4 4 minutes >60rpm for cardiovascular challenge and speed for motor initiation.      In //bars:    Modified tandem stance with each leg shifting weight anteriorly to promote glute activation in front of mirror. Tactile cueing at ASIS and PSIS for appropriate anterior weight shift. Completed anterior weight shift hold on last 2 reps with each LE. CGA approximately 6 minutes   Standing step overs x8 each LE. Stepping over RTB. Verbal cues for upright posture and anterior weight shift. Tactile cues for glute activation. Verbal cues for heel contact. 2nd set completed with foam roller. x8 each leg  Standing balloon taps reaching in and out of BOS. Verbal and tactile cues for upright posture. CGA  Seated marches x15 each leg. Verbal cues to increase speed of movement to increase height of movements for carryover to ambulatory mechanics.    Seated LAQ 2x15 each LE. Verbal cues to increase speed of movement for carryover to gait mechanics and velocity. Completed 1 leg at a time. 2nd set reciprocal movement   Bathroom mobility performed at end of session: addressed standing balance and mobility                    PT Education - 02/03/18 1525    Education provided  Yes    Education Details  exercise technique, HEP    Person(s) Educated  Patient    Methods  Explanation;Demonstration;Verbal cues    Comprehension  Verbalized understanding;Returned demonstration       PT Short Term Goals - 01/20/18 1501      PT SHORT TERM GOAL #1   Title  Patient will be independent in home exercise program to  improve strength/mobility for better functional independence with ADLs.    Baseline  hep compliant    Time  2    Period  Weeks    Status  Achieved      PT SHORT TERM GOAL #2   Title  Patient will require min cueing for STS transfer with CGA for increased independence with mobility.     Baseline  CGA    Time  2    Period  Weeks    Status  Achieved      PT SHORT TERM GOAL #3   Title  Patient will maintain upright posture for > 15 seconds to demonstrate strengthened postural control muscualture     Baseline  18 seconds     Time  2    Period  Weeks    Status  Achieved        PT Long Term Goals - 01/22/18 1619      PT LONG TERM GOAL #1   Title  Patient will perform bed mobility with Supervision to increase independent mobility    Baseline  able to perform mod I for increased time , occasional Min A for scooting up in bed; 7/16: min guard for safety with increased effort and time, cues to scoot back in bed before sit>supine 9/12: can do it independently but with difficulty    Time  8    Period  Weeks    Status  Achieved      PT LONG TERM GOAL #2   Title  Patient (< 21 years old) will complete five times sit to stand test in < 10 seconds indicating an increased LE strength and improved balance.    Baseline  2/18: 30 seconds from plinth chair height single UE on seat and single on walker ; 32 seconds from plinth with single UE on seat and single on walker. 4/10: 26 seconds from [facility] with one hand on walker5/29: 22.2 seconds with one hand on walker one hand on WC; 7/16: one hand on walker one hand on WC 18.96 seconds; pt achieving 75% of full upright standing 8/22:one hand on walker one hand on WC 18 seconds; pt achieving 75% of full upright standing  9/12: 18 seconds econds; pt achieving 75% of full upright standing 8/22:one hand on walker one hand on WC 10/15: 17seconds; pt achieving 75% of full upright posture    Time  8    Period  Weeks    Status  Partially Met      PT LONG TERM  GOAL  #3   Title  Patient will ambulate 40 ft with walker with no breaks to allow for increased mobility within home.     Baseline  12/26: 29 ft with walker and WC follow with no rest breaks; 2/18: multiple trials previous session of >100 ft     Time  8    Period  Weeks    Status  Achieved      PT LONG TERM GOAL #4   Title  Patient will increase BLE gross strength to 4+/5 as to improve functional strength for independent gait, increased standing tolerance and increased ADL ability.    Baseline   4-/5 RLE, 10/15: 4/5 RLE    Time  8    Period  Weeks    Status  Partially Met      PT LONG TERM GOAL #5   Title  Patient will increase lower extremity functional scale to >40/80 to demonstrate improved functional mobility and increased tolerance with ADLs.     Baseline  14/80; 12/26: 21/80; 2/18: 28/80  4/10; 25/80 5/29: 27/80; 7/16: 29/80 8/22: 30/80  9/12: 26/80 (feeling more tired due to moving) 10/15: 37/80    Time  8    Period  Weeks    Status  Partially Met      PT LONG TERM GOAL #6   Title  Patient will walk through a door involving one small step up/down to increase functional independence.     Baseline  require CGA  BUE support in // bars at this time 10/15: required BUE and CGA in //bars    Time  8    Period  Weeks    Status  Partially Met      PT LONG TERM GOAL #7   Title  Patient will ambulate 40 ft with least assistive device and Supervision with no breaks to allow for increased mobility within home.     Baseline  requires use of walker and CGA     Time  8    Period  Weeks    Status  Achieved      PT LONG TERM GOAL #8   Title  Patient will perform 10 MWT in < 1 minute to demonstrate improved ambulatory velocity and capacity.     Baseline  4/10: 2 minutes 45 seconds; 5/29: 2 minute 31 seconds; 3 minutes 22 seconds 8/22: 3 minutes 2 seconds  9/12:  2 minutes and 43 seconds 10/15: 48mnute 12 seconds    Time  8    Period  Weeks    Status  Partially Met      PT LONG TERM GOAL  #9    TITLE  Patient will increase Berg Balance score by > 6 points (26/56) to demonstrate decreased fall risk during functional activities.    Baseline  9/12: 20/56 10/15: 21/56    Time  8    Period  Weeks    Status  Partially Met      PT LONG TERM GOAL  #10   TITLE  Patient (< 68years old) will complete five times sit to stand test with full upright posture in < 30 seconds indicating an increased glute activation, LE strength and improved balance.    Baseline  10/15: 37seconds    Time  8    Period  Weeks    Status  New      PT LONG TERM GOAL  #11   TITLE  Patient will ambulate 100 ft with least assistive  device and Supervision with no breaks to allow for increased mobility within home.     Baseline  10/17: 94f    Time  8    Period  Weeks    Status  New    Target Date  03/19/18            Plan - 02/03/18 1605    Clinical Impression Statement  Patient completed step over foam roller with increased step length and anterior weight shift for improved gait mechanics following pre gait acitivity. Patient is requiring less cueing and better response for upright posture and anterior weight shift during all standing exercises however fatigues quickly due to glute weakness and decreased muscular endurance. Reciprocal movement during LAQ remains difficult. Patient will continue to benefit from skilled physical therapy to improve strength, balance, and functional mobility.     Clinical Impairments Affecting Rehab Potential  hx of HTN, HLD, CVA, learning disability, Diabetes, brain tumor,     PT Frequency  2x / week    PT Duration  8 weeks    PT Treatment/Interventions  ADLs/Self Care Home Management;Aquatic Therapy;Ultrasound;Moist Heat;Traction;DME Instruction;Gait training;Stair training;Functional mobility training;Therapeutic activities;Therapeutic exercise;Orthotic Fit/Training;Neuromuscular re-education;Balance training;Patient/family education;Manual techniques;Wheelchair mobility  training;Passive range of motion;Energy conservation;Taping;Visual/perceptual remediation/compensation    PT Next Visit Plan  balance and stability and ambulation, anterior weight shift hold    PT Home Exercise Plan  TrA contraction     Consulted and Agree with Plan of Care  Patient       Patient will benefit from skilled therapeutic intervention in order to improve the following deficits and impairments:  Abnormal gait, Decreased activity tolerance, Decreased balance, Decreased knowledge of precautions, Decreased endurance, Decreased coordination, Decreased knowledge of use of DME, Decreased mobility, Decreased range of motion, Difficulty walking, Decreased safety awareness, Decreased strength, Impaired flexibility, Impaired perceived functional ability, Impaired tone, Postural dysfunction, Improper body mechanics, Pain  Visit Diagnosis: Muscle weakness (generalized)  Other lack of coordination  Other abnormalities of gait and mobility  Hemiplegia and hemiparesis following cerebral infarction affecting right dominant side ([facility]     Problem List Patient Active Problem List   Diagnosis Date Noted  . Acute CV[place]rebro[place]) [place] 05/02/[place]  . Left-sided weakness 05/01/2016   AErick Blinks SPT  This entire session was performed under direct supervision and direction of a licensed therapist/therapist assistant . I have personally read, edited and approve of the note as written.  MJanna Arch PT, DPT   02/04/2018, 8:13 AM  [place] [facility] [address] [address]Phone: [phone]  Fax:  [phone] Name: Curtis BUDAYMRN: [id number] of Birth: 9March 13, 1967

## 2018-02-05 ENCOUNTER — Ambulatory Visit: Payer: Medicare HMO

## 2018-02-05 ENCOUNTER — Encounter: Payer: Medicare HMO | Admitting: Occupational Therapy

## 2018-02-05 DIAGNOSIS — R2689 Other abnormalities of gait and mobility: Secondary | ICD-10-CM

## 2018-02-05 DIAGNOSIS — M6281 Muscle weakness (generalized): Secondary | ICD-10-CM

## 2018-02-05 DIAGNOSIS — R278 Other lack of coordination: Secondary | ICD-10-CM | POA: Diagnosis not present

## 2018-02-05 DIAGNOSIS — I69351 Hemiplegia and hemiparesis following cerebral infarction affecting right dominant side: Secondary | ICD-10-CM

## 2018-02-05 NOTE — Therapy (Addendum)
[place] MAIN [facility] SERVICES [address] [place], [place], [address] Phone: [phone]   Fax:  [phone]  Physical Therapy Treatment  Patient Details  Name: Curtis Powell MRN: [id number] Date of Birth: March 26, 1966 Referring Provider (PT): Dr. Larinda Buttery   Encounter Date: 02/05/2018  PT End of Session - 02/05/18 1428    Visit Number  19    Number of Visits  9    Date for PT Re-Evaluation  02/12/18    Authorization Type  PN 5/10 begins 10/15     PT Start Time  1346    PT Stop Time  1427    PT Time Calculation (min)  41 min    Equipment Utilized During Treatment  Gait belt    Activity Tolerance  Patient tolerated treatment well    Behavior During Therapy  [facility] for tasks assessed/performed       Past Medical History:  Diagnosis Date  . GERD (gastroesophageal reflux disease)   . Hyperlipidemia   . Hypertension   . Obesity   . Stroke [facility])     Past Surgical History:  Procedure Laterality Date  . BRAIN SURGERY      There were no vitals filed for this visit.  Subjective Assessment - 02/05/18 1427    Subjective  Patient states he is doing well. States he did not walk today since he had therapy. Reports no falls since last visit.     Pertinent History   Patient is a pleasant 52 year old male who presents to physical therapy for weakness and immobility secondary to CVA.  Had a stroke in Feb 2018. He was previously fully independent, but this stroke caused severe residual deficits, mainly on the right side as well as speech, and now he is unable to walk or perform most of his ADLs on his own. Entire right side is very weak. He still has a little difficulty with speech but his swallowing is improved to baseline. His mom had to move in with him and now is his main caregiver.     Limitations  Sitting;Lifting;Standing;Walking;House hold activities    How long can you sit comfortably?  5 minutes    How long can you stand  comfortably?  5 minutes    How long can you walk comfortably?  10 ft    Patient Stated Goals  Walk without walker, walk further with walker, get some strength in back     Currently in Pain?  No/denies        In //bars:    Modified tandem stance with each leg shifting weight anteriorly to promote glute activation in front of mirror. Tactile cueing at ASIS and PSIS for appropriate anterior weight shift. Verbal cues to hold anterior shift for 5 seconds for sustained activation.CGA approximately 6 minutes    Standing step overs x6 each LE. Stepping over RTB tied to bars. Verbal cues for upright posture and anterior weight shift. Tactile cues for glute activation. Verbal cues for heel contact.   Backwards walking x2 lengths of bars. Verbal cues for upright posture. Verbal cues to not drag feet and lift knee towards the ceiling. CGA  Forward walking. Tactile and verbal cues for anterior weight shift followed by step with each LE. x2 length of bars. CGA   Seated balloon taps reaching in and out of BOS with back support. X 57mnutes   Seated marches x15 each leg. Verbal cues to increase speed of movement to increase height  of movements for carryover to ambulatory mechanics.                       PT Education - 02/05/18 1428    Education provided  Yes    Education Details  exercise technique, HEP, speed    Person(s) Educated  Patient    Methods  Explanation;Demonstration;Verbal cues    Comprehension  Verbalized understanding;Returned demonstration       PT Short Term Goals - 01/20/18 1501      PT SHORT TERM GOAL #1   Title  Patient will be independent in home exercise program to improve strength/mobility for better functional independence with ADLs.    Baseline  hep compliant    Time  2    Period  Weeks    Status  Achieved      PT SHORT TERM GOAL #2   Title  Patient will require min cueing for STS transfer with CGA for increased independence with mobility.      Baseline  CGA    Time  2    Period  Weeks    Status  Achieved      PT SHORT TERM GOAL #3   Title  Patient will maintain upright posture for > 15 seconds to demonstrate strengthened postural control muscualture     Baseline  18 seconds     Time  2    Period  Weeks    Status  Achieved        PT Long Term Goals - 01/22/18 1619      PT LONG TERM GOAL #1   Title  Patient will perform bed mobility with Supervision to increase independent mobility    Baseline  able to perform mod I for increased time , occasional Min A for scooting up in bed; 7/16: min guard for safety with increased effort and time, cues to scoot back in bed before sit>supine 9/12: can do it independently but with difficulty    Time  8    Period  Weeks    Status  Achieved      PT LONG TERM GOAL #2   Title  Patient (< 18 years old) will complete five times sit to stand test in < 10 seconds indicating an increased LE strength and improved balance.    Baseline  2/18: 30 seconds from plinth chair height single UE on seat and single on walker ; 32 seconds from plinth with single UE on seat and single on walker. 4/10: 26 seconds from [facility] with one hand on walker5/29: 22.2 seconds with one hand on walker one hand on WC; 7/16: one hand on walker one hand on WC 18.96 seconds; pt achieving 75% of full upright standing 8/22:one hand on walker one hand on WC 18 seconds; pt achieving 75% of full upright standing  9/12: 18 seconds econds; pt achieving 75% of full upright standing 8/22:one hand on walker one hand on WC 10/15: 17seconds; pt achieving 75% of full upright posture    Time  8    Period  Weeks    Status  Partially Met      PT LONG TERM GOAL #3   Title  Patient will ambulate 40 ft with walker with no breaks to allow for increased mobility within home.     Baseline  12/26: 29 ft with walker and WC follow with no rest breaks; 2/18: multiple trials previous session of >100 ft     Time  8  Period  Weeks    Status  Achieved       PT LONG TERM GOAL #4   Title  Patient will increase BLE gross strength to 4+/5 as to improve functional strength for independent gait, increased standing tolerance and increased ADL ability.    Baseline   4-/5 RLE, 10/15: 4/5 RLE    Time  8    Period  Weeks    Status  Partially Met      PT LONG TERM GOAL #5   Title  Patient will increase lower extremity functional scale to >40/80 to demonstrate improved functional mobility and increased tolerance with ADLs.     Baseline  14/80; 12/26: 21/80; 2/18: 28/80  4/10; 25/80 5/29: 27/80; 7/16: 29/80 8/22: 30/80  9/12: 26/80 (feeling more tired due to moving) 10/15: 37/80    Time  8    Period  Weeks    Status  Partially Met      PT LONG TERM GOAL #6   Title  Patient will walk through a door involving one small step up/down to increase functional independence.     Baseline  require CGA  BUE support in // bars at this time 10/15: required BUE and CGA in //bars    Time  8    Period  Weeks    Status  Partially Met      PT LONG TERM GOAL #7   Title  Patient will ambulate 40 ft with least assistive device and Supervision with no breaks to allow for increased mobility within home.     Baseline  requires use of walker and CGA     Time  8    Period  Weeks    Status  Achieved      PT LONG TERM GOAL #8   Title  Patient will perform 10 MWT in < 1 minute to demonstrate improved ambulatory velocity and capacity.     Baseline  4/10: 2 minutes 45 seconds; 5/29: 2 minute 31 seconds; 3 minutes 22 seconds 8/22: 3 minutes 2 seconds  9/12:  2 minutes and 43 seconds 10/15: 74mnute 12 seconds    Time  8    Period  Weeks    Status  Partially Met      PT LONG TERM GOAL  #9   TITLE  Patient will increase Berg Balance score by > 6 points (26/56) to demonstrate decreased fall risk during functional activities.    Baseline  9/12: 20/56 10/15: 21/56    Time  8    Period  Weeks    Status  Partially Met      PT LONG TERM GOAL  #10   TITLE  Patient (< 655years  old) will complete five times sit to stand test with full upright posture in < 30 seconds indicating an increased glute activation, LE strength and improved balance.    Baseline  10/15: 37seconds    Time  8    Period  Weeks    Status  New      PT LONG TERM GOAL  #11   TITLE  Patient will ambulate 100 ft with least assistive device and Supervision with no breaks to allow for increased mobility within home.     Baseline  10/17: 867f   Time  8    Period  Weeks    Status  New    Target Date  03/19/18            Plan -  02/05/18 1523    Clinical Impression Statement  Patient was able to sustain anterior weight shift for extended time (5seconds) in modified tandem stance with improved glute activation. Introduced backwards walking to increase ease of eccentric control and foot clearance with ability to step over obstacles. Patient continues to increase speed of seated marches to increase gait speed. Patient will continue to benefit from skilled physical therapy to improve strength, balance, and functional mobility.     Clinical Impairments Affecting Rehab Potential  hx of HTN, HLD, CVA, learning disability, Diabetes, brain tumor,     PT Frequency  2x / week    PT Duration  8 weeks    PT Treatment/Interventions  ADLs/Self Care Home Management;Aquatic Therapy;Ultrasound;Moist Heat;Traction;DME Instruction;Gait training;Stair training;Functional mobility training;Therapeutic activities;Therapeutic exercise;Orthotic Fit/Training;Neuromuscular re-education;Balance training;Patient/family education;Manual techniques;Wheelchair mobility training;Passive range of motion;Energy conservation;Taping;Visual/perceptual remediation/compensation    PT Next Visit Plan  balance and stability and ambulation, anterior weight shift hold    PT Home Exercise Plan  TrA contraction     Consulted and Agree with Plan of Care  Patient       Patient will benefit from skilled therapeutic intervention in order to  improve the following deficits and impairments:  Abnormal gait, Decreased activity tolerance, Decreased balance, Decreased knowledge of precautions, Decreased endurance, Decreased coordination, Decreased knowledge of use of DME, Decreased mobility, Decreased range of motion, Difficulty walking, Decreased safety awareness, Decreased strength, Impaired flexibility, Impaired perceived functional ability, Impaired tone, Postural dysfunction, Improper body mechanics, Pain  Visit Diagnosis: Muscle weakness (generalized)  Other lack of coordination  Other abnormalities of gait and mobility  Hemiplegia and hemiparesis following cerebral infarction affecting right dominant side [facility])     Problem List Patient Active Problem List   Diagnosis Date Noted  . Acute CVA (cerebrovascular accident) ([place]) 05/02/2016  . Left-sided weakness 05/01/2016   Erick Blinks, SPT  This entire session was performed under direct supervision and direction of a licensed therapist/therapist assistant . I have personally read, edited and approve of the note as written.  Janna Arch, PT, DPT   02/05/2018, 5:38 PM  [place] MAIN [facility] SERVICES [address] [place], [place], [address] Phone: [phone]   Fax:  [phone]  Name: Curtis Powell MRN: [id number] Date of Birth: [birth year]

## 2018-02-10 ENCOUNTER — Encounter: Payer: Self-pay | Admitting: Occupational Therapy

## 2018-02-10 ENCOUNTER — Ambulatory Visit: Payer: Medicare HMO | Attending: Family Medicine | Admitting: Occupational Therapy

## 2018-02-10 ENCOUNTER — Ambulatory Visit: Payer: Medicare HMO

## 2018-02-10 DIAGNOSIS — R278 Other lack of coordination: Secondary | ICD-10-CM | POA: Insufficient documentation

## 2018-02-10 DIAGNOSIS — I69351 Hemiplegia and hemiparesis following cerebral infarction affecting right dominant side: Secondary | ICD-10-CM

## 2018-02-10 DIAGNOSIS — M6281 Muscle weakness (generalized): Secondary | ICD-10-CM

## 2018-02-10 DIAGNOSIS — R2689 Other abnormalities of gait and mobility: Secondary | ICD-10-CM | POA: Insufficient documentation

## 2018-02-10 NOTE — Therapy (Signed)
[place] MAIN [facility] SERVICES [address] [place], [place], [address] Phone: [phone]   Fax:  [phone]  Physical Therapy Treatment  Patient Details  Name: Curtis Powell MRN: [id number] Date of Birth: 1966-03-30 Referring Provider (PT): Dr. Arrie Aran Santaya   Encounter Date: 02/10/2018  PT End of Session - 02/10/18 1349    Visit Number  53    Number of Visits  84    Date for PT Re-Evaluation  02/12/18    Authorization Type  PN 6/10 begins 10/15     PT Start Time  1347    PT Stop Time  1430    PT Time Calculation (min)  43 min    Equipment Utilized During Treatment  Gait belt    Activity Tolerance  Patient tolerated treatment well    Behavior During Therapy  [facility] for tasks assessed/performed       Past Medical History:  Diagnosis Date  . GERD (gastroesophageal reflux disease)   . Hyperlipidemia   . Hypertension   . Obesity   . Stroke [facility])     Past Surgical History:  Procedure Laterality Date  . BRAIN SURGERY      There were no vitals filed for this visit.  Subjective Assessment - 02/10/18 1348    Subjective  Patient states he is doing well. Reports no falls since last visit. Denies pain currently.     Pertinent History   Patient is a pleasant 52 year old male who presents to physical therapy for weakness and immobility secondary to CVA.  Had a stroke in Feb 2018. He was previously fully independent, but this stroke caused severe residual deficits, mainly on the right side as well as speech, and now he is unable to walk or perform most of his ADLs on his own. Entire right side is very weak. He still has a little difficulty with speech but his swallowing is improved to baseline. His mom had to move in with him and now is his main caregiver.     Limitations  Sitting;Lifting;Standing;Walking;House hold activities    How long can you sit comfortably?  5 minutes    How long can you stand comfortably?  5 minutes    How  long can you walk comfortably?  10 ft    Patient Stated Goals  Walk without walker, walk further with walker, get some strength in back     Currently in Pain?  No/denies          TREATMENT   Ther-ex  Modified tandem stancewith each legshifting weight anteriorly to promote glute activationin front of mirror. Tactile cueing at ASIS and PSIS for appropriateanterior weightshift.Verbal cues to hold anterior shift for 5 seconds for sustained activation Standing step overs x 3each LE. Stepping over RTB tied to bars. Verbal cues for upright postureand anterior weight shift.Tactile cues for glute activation. Pt struggles to lift LE enough to clear band; Seatedmarches2 x 15each leg. Verbal cues to increase speed of movementto increaseheightof movements for carryover to ambulatory mechanics.  Seated LAQ 2 x 15 bilateral with cues to increase speed and allow for full knee flexion between repetitions;   Neuromuscular Re-education  Backwards walking in // bars x 2 lengths of bars. Verbal cues for upright posture. Verbal cues to not drag feet and lift knee towards the ceiling. CGA Forward walking in // bars x 2 lengths of bars. Tactile and verbal cues for anterior weight shift followed by step with each LE. Side  stepping in // bars x 2 lengths. CGA required and fatigue noted; Seatedballoon taps reaching in and out of BOS with back support x 5 minutes   Pt educated throughout session about proper posture and technique with exercises. Improved exercise technique, movement at target joints, use of target muscles after min to mod verbal, visual, tactile cues.   Pt demonstrates excellent motivation during session today and denies any pain. He struggles with adequate hip flexion and frequently substitutes with lumbar flexion. Pt requires tactile cues to activate glutes with weight shifting. Cues to improve speed of seated marches and LAQ for more explosive movements. Patient will continue to  benefit from skilled physical therapy to improve strength, balance, and functional mobility.                       PT Short Term Goals - 01/20/18 1501      PT SHORT TERM GOAL #1   Title  Patient will be independent in home exercise program to improve strength/mobility for better functional independence with ADLs.    Baseline  hep compliant    Time  2    Period  Weeks    Status  Achieved      PT SHORT TERM GOAL #2   Title  Patient will require min cueing for STS transfer with CGA for increased independence with mobility.     Baseline  CGA    Time  2    Period  Weeks    Status  Achieved      PT SHORT TERM GOAL #3   Title  Patient will maintain upright posture for > 15 seconds to demonstrate strengthened postural control muscualture     Baseline  18 seconds     Time  2    Period  Weeks    Status  Achieved        PT Long Term Goals - 01/22/18 1619      PT LONG TERM GOAL #1   Title  Patient will perform bed mobility with Supervision to increase independent mobility    Baseline  able to perform mod I for increased time , occasional Min A for scooting up in bed; 7/16: min guard for safety with increased effort and time, cues to scoot back in bed before sit>supine 9/12: can do it independently but with difficulty    Time  8    Period  Weeks    Status  Achieved      PT LONG TERM GOAL #2   Title  Patient (< 34 years old) will complete five times sit to stand test in < 10 seconds indicating an increased LE strength and improved balance.    Baseline  2/18: 30 seconds from plinth chair height single UE on seat and single on walker ; 32 seconds from plinth with single UE on seat and single on walker. 4/10: 26 seconds from [facility] with one hand on walker5/29: 22.2 seconds with one hand on walker one hand on WC; 7/16: one hand on walker one hand on WC 18.96 seconds; pt achieving 75% of full upright standing 8/22:one hand on walker one hand on WC 18 seconds; pt achieving 75% of  full upright standing  9/12: 18 seconds econds; pt achieving 75% of full upright standing 8/22:one hand on walker one hand on WC 10/15: 17seconds; pt achieving 75% of full upright posture    Time  8    Period  Weeks    Status  Partially  Met      PT LONG TERM GOAL #3   Title  Patient will ambulate 40 ft with walker with no breaks to allow for increased mobility within home.     Baseline  12/26: 29 ft with walker and WC follow with no rest breaks; 2/18: multiple trials previous session of >100 ft     Time  8    Period  Weeks    Status  Achieved      PT LONG TERM GOAL #4   Title  Patient will increase BLE gross strength to 4+/5 as to improve functional strength for independent gait, increased standing tolerance and increased ADL ability.    Baseline   4-/5 RLE, 10/15: 4/5 RLE    Time  8    Period  Weeks    Status  Partially Met      PT LONG TERM GOAL #5   Title  Patient will increase lower extremity functional scale to >40/80 to demonstrate improved functional mobility and increased tolerance with ADLs.     Baseline  14/80; 12/26: 21/80; 2/18: 28/80  4/10; 25/80 5/29: 27/80; 7/16: 29/80 8/22: 30/80  9/12: 26/80 (feeling more tired due to moving) 10/15: 37/80    Time  8    Period  Weeks    Status  Partially Met      PT LONG TERM GOAL #6   Title  Patient will walk through a door involving one small step up/down to increase functional independence.     Baseline  require CGA  BUE support in // bars at this time 10/15: required BUE and CGA in //bars    Time  8    Period  Weeks    Status  Partially Met      PT LONG TERM GOAL #7   Title  Patient will ambulate 40 ft with least assistive device and Supervision with no breaks to allow for increased mobility within home.     Baseline  requires use of walker and CGA     Time  8    Period  Weeks    Status  Achieved      PT LONG TERM GOAL #8   Title  Patient will perform 10 MWT in < 1 minute to demonstrate improved ambulatory velocity and  capacity.     Baseline  4/10: 2 minutes 45 seconds; 5/29: 2 minute 31 seconds; 3 minutes 22 seconds 8/22: 3 minutes 2 seconds  9/12:  2 minutes and 43 seconds 10/15: 46mnute 12 seconds    Time  8    Period  Weeks    Status  Partially Met      PT LONG TERM GOAL  #9   TITLE  Patient will increase Berg Balance score by > 6 points (26/56) to demonstrate decreased fall risk during functional activities.    Baseline  9/12: 20/56 10/15: 21/56    Time  8    Period  Weeks    Status  Partially Met      PT LONG TERM GOAL  #10   TITLE  Patient (< 772years old) will complete five times sit to stand test with full upright posture in < 30 seconds indicating an increased glute activation, LE strength and improved balance.    Baseline  10/15: 37seconds    Time  8    Period  Weeks    Status  New      PT LONG TERM GOAL  #11   TITLE  Patient will ambulate 100 ft with least assistive device and Supervision with no breaks to allow for increased mobility within home.     Baseline  10/17: 54f    Time  8    Period  Weeks    Status  New    Target Date  03/19/18            Plan - 02/10/18 1350    Clinical Impression Statement  Pt demonstrates excellent motivation during session today and denies any pain. He struggles with adequate hip flexion and frequently substitutes with lumbar flexion. Pt requires tactile cues to activate glutes with weight shifting. Cues to improve speed of seated marches and LAQ for more explosive movements. Patient will continue to benefit from skilled physical therapy to improve strength, balance, and functional mobility.      Clinical Impairments Affecting Rehab Potential  hx of HTN, HLD, CVA, learning disability, Diabetes, brain tumor,     PT Frequency  2x / week    PT Duration  8 weeks    PT Treatment/Interventions  ADLs/Self Care Home Management;Aquatic Therapy;Ultrasound;Moist Heat;Traction;DME Instruction;Gait training;Stair training;Functional mobility  training;Therapeutic activities;Therapeutic exercise;Orthotic Fit/Training;Neuromuscular re-education;Balance training;Patient/family education;Manual techniques;Wheelchair mobility training;Passive range of motion;Energy conservation;Taping;Visual/perceptual remediation/compensation    PT Next Visit Plan  balance and stability and ambulation, anterior weight shift hold    PT Home Exercise Plan  TrA contraction     Consulted and Agree with Plan of Care  Patient       Patient will benefit from skilled therapeutic intervention in order to improve the following deficits and impairments:  Abnormal gait, Decreased activity tolerance, Decreased balance, Decreased knowledge of precautions, Decreased endurance, Decreased coordination, Decreased knowledge of use of DME, Decreased mobility, Decreased range of motion, Difficulty walking, Decreased safety awareness, Decreased strength, Impaired flexibility, Impaired perceived functional ability, Impaired tone, Postural dysfunction, Improper body mechanics, Pain  Visit Diagnosis: Muscle weakness (generalized)  Other lack of coordination  Other abnormalities of gait and mobility  Hemiplegia and hemiparesis following cerebral infarction affecting right dominant side ([facility]     Problem List Patient Active Problem List   Diagnosis Date Noted  . Acute CVA (cerebrovascular accident) ([place] 05/02/2016  . Left-sided weakness 05/01/2016   JPhillips GroutPT, DPT, GCS  Huprich,Jason 02/10/2018, 3:44 PM  [place] [facility] [address] [address]Phone: [phone]  Fax:  [phone] Name: BQURON RUDDYMRN: [id number] of Birth: [birth year]

## 2018-02-10 NOTE — Therapy (Addendum)
[place] MAIN [facility] SERVICES [address] [place], [place], [address] Phone: [phone]   Fax:  [phone]  Occupational Therapy Treatment  Patient Details  Name: Curtis Powell MRN: [id number] Date of Birth: 26-Sep-1965 No data recorded  Encounter Date: 02/10/2018  OT End of Session - 02/10/18 1311    Visit Number  21    Number of Visits  75    Date for OT Re-Evaluation  04/08/18    Authorization Type  Visit 6 of 10 for progress reporting period starting 01/20/2018    OT Start Time  1300    OT Stop Time  1345    OT Time Calculation (min)  45 min    Activity Tolerance  Patient tolerated treatment well    Behavior During Therapy  [facility] for tasks assessed/performed       Past Medical History:  Diagnosis Date  . GERD (gastroesophageal reflux disease)   . Hyperlipidemia   . Hypertension   . Obesity   . Stroke [facility])     Past Surgical History:  Procedure Laterality Date  . BRAIN SURGERY      There were no vitals filed for this visit.  Subjective Assessment - 02/10/18 1305    Subjective   Pt reports that he is having a good day and looking forward to attending his sister's birthday party soon.    Pertinent History  Pt. is a 52 y.o. male who suffered a CVA on 05/01/2016. Pt. was admitted to the hospital. Once discharged, he received Home Health PT and OT services for about a month. Pt. has had multiple CVAs over the past 8 years, and has had multiple falls in the past 6 months. Pt. resides in an apartment. Pt. has caregivers for 80 hours. Pt.'s mother stays with pt. at night, and assists with IADL tasks.      Patient Stated Goals  To be able to throw a ball and dribble a ball.    Currently in Pain?  No/denies    Pain Score  0-No pain      OT TREATMENT  Therapeutic exercise:   Pt. worked on grasping large flat shapes, and moving them through varying heights of vertical dowels while wearing 1.5# cuff weights. Pt required x4 rest breaks  during the task. Pt demonstrated signs of increased exertion when reaching for higher dowels including compensatory postures such as leaning laterally, increased respirations, and straining. Pt required verbal cuing to minimize postural compensation.  Self-care:   Pt completed full toileting routine. Pt required contact guard assistance to transfer from sit<>stand using grab bars. Pt was able to modified independently lower pants. Pt required min A for balance while toileting and when negotiating pants over hips. Pt required verbal cuing for hand and foot placement while transferring back to w/c.                      OT Education - 02/10/18 1311    Education provided  Yes    Education Details  BUE strengthening, functional reaching    Person(s) Educated  Patient    Methods  Explanation;Demonstration    Comprehension  Verbalized understanding;Returned demonstration;Verbal cues required       OT Short Term Goals - 03/03/17 1459      OT SHORT TERM GOAL #1   Title  `        OT Long Term Goals - 01/14/18 1351      OT LONG TERM GOAL #  1   Title  Pt. will UE functioning to be able to perfrom toilet hygiene with minimal assistance.    Baseline  01/14/2018 - Pt completes toileting hygiene independently. Pt requires assistance managing LB clothing.    Time  12    Period  Weeks    Status  Achieved    Target Date  01/14/18      OT LONG TERM GOAL #2   Title  Pt. will complete self grooming with minA    Baseline  01/14/2018 - Pt is independent with oral care and washing his face and head but requires assistance for shaving.    Time  12    Period  Weeks    Status  On-going    Target Date  04/08/18      OT LONG TERM GOAL #3   Title  Pt. will perform self-feeding skills with modified indepndence    Baseline  01/14/2018 - Pt is able to complete self-feeding independently with minimal spillage. Pt is unable to cut meat at this time.    Time  12    Period  Weeks    Status   Achieved    Target Date  01/14/18      OT LONG TERM GOAL #4   Title  Pt. will perform self dressing with minA and A/E as needed.    Baseline  01/14/2018 - Pt. continues to require extensive assist for LB dressing due to balance deficits: Pt. requires min A for UB dressing.    Time  12    Period  Weeks    Status  On-going    Target Date  04/08/18      OT LONG TERM GOAL #5   Title  Pt. will perform light home making tasks with minA    Baseline  01/14/2018 - Pt is able to wash dishes while seated in w/c with increased accessibility of new apartment. Pt does not complete laundry nor sweeping, vacuuming, and dusting.     Time  12    Period  Weeks    Status  Achieved    Target Date  01/14/18      Long Term Additional Goals   Additional Long Term Goals  Yes      OT LONG TERM GOAL #6   Title  Pt. will write his name efficiently with 100% legibility    Baseline  01/14/2018 - ~80% legibility for name. Requires increased time to do so.    Time  8    Period  Weeks    Status  On-going    Target Date  03/11/18      OT LONG TERM GOAL #7   Title  Pt will independently and consistently follow HEP to increase UE strength to increase functional independence    Baseline  01/14/2018 - Pt does not have a HEP at this time    Time  6    Period  Weeks    Status  New    Target Date  02/25/18            Plan - 02/10/18 1312    Clinical Impression Statement  Pt continues to work to improve BUE strengthening including when keeping arms sustained in elevation above head. Pt presents with greater deficits when keeping arms susained above head. Continued BUE strengthening is required to support pt's independence during ADLs and IADLs.    Occupational performance deficits (Please refer to evaluation for details):  ADL's;IADL's    Rehab Potential  Fair    OT Frequency  2x / week    OT Duration  12 weeks    OT Treatment/Interventions  Self-care/ADL training;Patient/family education;DME and/or AE  instruction;Therapeutic exercise;Moist Heat;Neuromuscular education    Clinical Decision Making  Several treatment options, min-mod task modification necessary    Consulted and Agree with Plan of Care  Patient       Patient will benefit from skilled therapeutic intervention in order to improve the following deficits and impairments:  Pain, Decreased cognition, Decreased activity tolerance, Impaired UE functional use, Decreased range of motion, Decreased coordination, Decreased endurance  Visit Diagnosis: Muscle weakness (generalized)    Problem List Patient Active Problem List   Diagnosis Date Noted  . Acute CVA (cerebrovascular accident) ([place]) 05/02/2016  . Left-sided weakness 05/01/2016    Oliver Hum, OTS 02/10/2018, 1:38 PM   This entire session was performed under direct supervision and direction of a licensed therapist/therapist assistant . I have personally read, edited and approve of the note as written.  Harrel Carina, MS, OTR/L   [place] MAIN [facility] SERVICES [address] [place], [place], [address] Phone: [phone]   Fax:  [phone]  Name: KINGDOM VANZANTEN MRN: [id number] Date of Birth: [birth year]

## 2018-02-12 ENCOUNTER — Ambulatory Visit: Payer: Medicare HMO | Admitting: Occupational Therapy

## 2018-02-12 ENCOUNTER — Ambulatory Visit: Payer: Medicare HMO

## 2018-02-12 DIAGNOSIS — M6281 Muscle weakness (generalized): Secondary | ICD-10-CM

## 2018-02-12 DIAGNOSIS — R2689 Other abnormalities of gait and mobility: Secondary | ICD-10-CM

## 2018-02-12 DIAGNOSIS — I69351 Hemiplegia and hemiparesis following cerebral infarction affecting right dominant side: Secondary | ICD-10-CM

## 2018-02-12 DIAGNOSIS — R278 Other lack of coordination: Secondary | ICD-10-CM

## 2018-02-12 NOTE — Therapy (Addendum)
[place] MAIN [facility] SERVICES [address] [place], [place], [address] Phone: [phone]   Fax:  [phone]  Physical Therapy Treatment Physical Therapy Progress Note (Recert)   Dates of reporting period  01/20/18   to   02/12/18   Patient Details  Name: Curtis Powell MRN: [id number] Date of Birth: 1965/10/18 No data recorded  Encounter Date: 02/12/2018  PT End of Session - 02/12/18 1421    Visit Number  49    Number of Visits  43    Date for PT Re-Evaluation  04/09/18    Authorization Type  PN 7/10 begins 10/15 (next PN 1/10 starts 11/7)    PT Start Time  1333    PT Stop Time  1418    PT Time Calculation (min)  45 min    Equipment Utilized During Treatment  Gait belt    Activity Tolerance  Patient tolerated treatment well    Behavior During Therapy  WFL for tasks assessed/performed       Past Medical History:  Diagnosis Date  . GERD (gastroesophageal reflux disease)   . Hyperlipidemia   . Hypertension   . Obesity   . Stroke [facility])     Past Surgical History:  Procedure Laterality Date  . BRAIN SURGERY      There were no vitals filed for this visit.  Subjective Assessment - 02/12/18 1331    Subjective  Patient states he is doing well. Denies any falls or stumbles since last session. States walking is getting easier.     Pertinent History   Patient is a pleasant 52 year old male who presents to physical therapy for weakness and immobility secondary to CVA.  Had a stroke in Feb 2018. He was previously fully independent, but this stroke caused severe residual deficits, mainly on the right side as well as speech, and now he is unable to walk or perform most of his ADLs on his own. Entire right side is very weak. He still has a little difficulty with speech but his swallowing is improved to baseline. His mom had to move in with him and now is his main caregiver.     Limitations  Sitting;Lifting;Standing;Walking;House hold activities     How long can you sit comfortably?  5 minutes    How long can you stand comfortably?  5 minutes    How long can you walk comfortably?  10 ft    Patient Stated Goals  Walk without walker, walk further with walker, get some strength in back     Currently in Pain?  No/denies             RECERT 5xsts- with full upright posture- 20 seconds with BUE support 5xsts- 16 seconds with BUE support Strength- grossly 4/5 on RLE LEFS- 23/80 56mt; 1 minute 21 seconds with RW and w/c follow  BERG- 20/56 Ambulate 1061fwith RW and w/c follow with no seated rest breaks. Verbal cues for large steps and upright posture.   Bathroom mobility performed at end of session.           Patient's condition has the potential to improve in response to therapy. Maximum improvement is yet to be obtained. The anticipated improvement is attainable and reasonable in a generally predictable time.  Patient reports walking is getting easier.                PT Education - 02/12/18 1329    Education provided  Yes  Education Details  exercise technique, gait, speed, goals    Person(s) Educated  Patient    Methods  Explanation;Demonstration;Verbal cues    Comprehension  Verbalized understanding;Returned demonstration       PT Short Term Goals - 02/12/18 1422      PT SHORT TERM GOAL #1   Title  Patient will be independent in home exercise program to improve strength/mobility for better functional independence with ADLs.    Baseline  hep compliant    Time  2    Period  Weeks    Status  Achieved      PT SHORT TERM GOAL #2   Title  Patient will require min cueing for STS transfer with CGA for increased independence with mobility.     Baseline  CGA    Time  2    Period  Weeks    Status  Achieved      PT SHORT TERM GOAL #3   Title  Patient will maintain upright posture for > 15 seconds to demonstrate strengthened postural control muscualture     Baseline  18 seconds     Time  2    Period   Weeks    Status  Achieved        PT Long Term Goals - 02/12/18 1337      PT LONG TERM GOAL #1   Title  Patient will perform bed mobility with Supervision to increase independent mobility    Baseline  able to perform mod I for increased time , occasional Min A for scooting up in bed; 7/16: min guard for safety with increased effort and time, cues to scoot back in bed before sit>supine 9/12: can do it independently but with difficulty    Time  8    Period  Weeks    Status  Achieved      PT LONG TERM GOAL #2   Title  Patient (< 5 years old) will complete five times sit to stand test in < 10 seconds indicating an increased LE strength and improved balance.    Baseline  2/18: 30 seconds from plinth chair height single UE on seat and single on walker ; 32 seconds from plinth with single UE on seat and single on walker. 4/10: 26 seconds from [facility] with one hand on walker5/29: 22.2 seconds with one hand on walker one hand on WC; 7/16: one hand on walker one hand on WC 18.96 seconds; pt achieving 75% of full upright standing 8/22:one hand on walker one hand on WC 18 seconds; pt achieving 75% of full upright standing  9/12: 18 seconds econds; pt achieving 75% of full upright standing 8/22:one hand on walker one hand on WC 10/15: 17seconds; pt achieving 75% of full upright posture 11/7: 16 seconds; pt achieving 75 upright posture    Time  8    Period  Weeks    Status  Partially Met    Target Date  04/09/18      PT LONG TERM GOAL #3   Title  Patient will ambulate 40 ft with walker with no breaks to allow for increased mobility within home.     Baseline  12/26: 29 ft with walker and WC follow with no rest breaks; 2/18: multiple trials previous session of >100 ft     Time  8    Period  Weeks    Status  Achieved      PT LONG TERM GOAL #4   Title  Patient will increase  BLE gross strength to 4+/5 as to improve functional strength for independent gait, increased standing tolerance and increased ADL  ability.    Baseline   4-/5 RLE, 10/15: 4/5 RLE 11/7: 4/5 RLE    Time  8    Period  Weeks    Status  Partially Met    Target Date  04/09/18      PT LONG TERM GOAL #5   Title  Patient will increase lower extremity functional scale to >40/80 to demonstrate improved functional mobility and increased tolerance with ADLs.     Baseline  14/80; 12/26: 21/80; 2/18: 28/80  4/10; 25/80 5/29: 27/80; 7/16: 29/80 8/22: 30/80  9/12: 26/80 (feeling more tired due to moving) 10/15: 37/80 11/7: 23/80    Time  8    Period  Weeks    Status  Partially Met    Target Date  04/09/18      Additional Long Term Goals   Additional Long Term Goals  Yes      PT LONG TERM GOAL #6   Title  Patient will walk through a door involving one small step up/down to increase functional independence.     Baseline  require CGA  BUE support in // bars at this time 10/15: required BUE and CGA in //bars    Time  8    Period  Weeks    Status  Deferred      PT LONG TERM GOAL #7   Title  Patient will ambulate 40 ft with least assistive device and Supervision with no breaks to allow for increased mobility within home.     Baseline  requires use of walker and CGA     Time  8    Period  Weeks    Status  Achieved      PT LONG TERM GOAL #8   Title  Patient will perform 10 MWT in < 1 minute to demonstrate improved ambulatory velocity and capacity.     Baseline  4/10: 2 minutes 45 seconds; 5/29: 2 minute 31 seconds; 3 minutes 22 seconds 8/22: 3 minutes 2 seconds  9/12:  2 minutes and 43 seconds 10/15: 76mnute 12 seconds 11/7: 1 minute 21 seconds    Time  8    Period  Weeks    Status  Partially Met    Target Date  04/09/18      PT LONG TERM GOAL  #9   TITLE  Patient will increase Berg Balance score by > 6 points (26/56) to demonstrate decreased fall risk during functional activities.    Baseline  9/12: 20/56 10/15: 21/56 11/7: 20/56    Time  8    Period  Weeks    Status  Partially Met    Target Date  04/09/18      PT LONG  TERM GOAL  #10   TITLE  Patient (< 792years old) will complete five times sit to stand test with full upright posture in < 30 seconds indicating an increased glute activation, LE strength and improved balance.    Baseline  10/15: 37seconds 11/7: 20 seconds    Time  8    Period  Weeks    Status  Achieved      PT LONG TERM GOAL  #11   TITLE  Patient will ambulate 100 ft with least assistive device and Supervision with no breaks to allow for increased mobility within home.     Baseline  10/17: 822f11/7: 10045f  Time  8    Period  Weeks    Status  Achieved      PT LONG TERM GOAL  #12   TITLE  Patient (< 69 years old) will complete five times sit to stand test with full upright posture in < 15 seconds indicating increased glute activation, LE strength and improved balance.    Baseline  11/7: 20 seconds    Time  8    Period  Weeks    Status  New    Target Date  04/09/18      PT LONG TERM GOAL  #13   TITLE  Patient will ambulate 150 ft with least assistive device and Supervision with no breaks to allow for increased mobility within home.     Baseline  11/7: 123f    Time  8    Period  Weeks    Status  New    Target Date  04/09/18            Plan - 02/12/18 1420    Clinical Impression Statement  Patient continues to make progress towards goals with increases in gait speed, ambulation distance, and body mechanics during 5xsts. Patient improved 123m time from 2 minute and 12 seconds to 1 minute and 21 seconds demonstrating improved gait speed and ambulation. Patient also improved 5xsts time with improved ability to attain full upright posture demonstrating improved glute activation. Patient still has poor balance demonstrated by BERG score and remains high fall risk. Patient also requires cueing for gait mechanics due to decreased glute activation and strength impacting ambulation. Patient's condition has the potential to improve in response to therapy. Maximum improvement is yet to be  obtained. The anticipated improvement is attainable and reasonable in a generally predictable time. Patient will continue to benefit from skilled physical therapy to improve strength, balance, and functional mobility.     Clinical Impairments Affecting Rehab Potential  hx of HTN, HLD, CVA, learning disability, Diabetes, brain tumor,     PT Frequency  2x / week    PT Duration  8 weeks    PT Treatment/Interventions  ADLs/Self Care Home Management;Aquatic Therapy;Ultrasound;Moist Heat;Traction;DME Instruction;Gait training;Stair training;Functional mobility training;Therapeutic activities;Therapeutic exercise;Orthotic Fit/Training;Neuromuscular re-education;Balance training;Patient/family education;Manual techniques;Wheelchair mobility training;Passive range of motion;Energy conservation;Taping;Visual/perceptual remediation/compensation    PT Next Visit Plan  balance and stability and ambulation, anterior weight shift hold    PT Home Exercise Plan  TrA contraction     Consulted and Agree with Plan of Care  Patient       Patient will benefit from skilled therapeutic intervention in order to improve the following deficits and impairments:  Abnormal gait, Decreased activity tolerance, Decreased balance, Decreased knowledge of precautions, Decreased endurance, Decreased coordination, Decreased knowledge of use of DME, Decreased mobility, Decreased range of motion, Difficulty walking, Decreased safety awareness, Decreased strength, Impaired flexibility, Impaired perceived functional ability, Impaired tone, Postural dysfunction, Improper body mechanics, Pain  Visit Diagnosis: Muscle weakness (generalized)  Other lack of coordination  Other abnormalities of gait and mobility  Hemiplegia and hemiparesis following cerebral infarction affecting right dominant side ([facility]    Problem List Patient Active Problem List   Diagnosis Date Noted  . [place]  .  Left-sided weakness 05/01/2016   AnErick BlinksSPT  This entire session was performed under direct supervision and direction of a licensed therapist/therapist assistant . I have personally read, edited and approve of the note as written.  MaJanna ArchPT, DPT   02/13/2018,  11:06 AM  [place] MAIN [facility] SERVICES [address] [place], [place], [address] Phone: [phone]   Fax:  [phone]  Name: Curtis Powell MRN: [id number] Date of Birth: 1965-09-11

## 2018-02-12 NOTE — Therapy (Addendum)
[place] MAIN [facility] SERVICES [address] [place], [place], [address] Phone: [phone]   Fax:  [phone]  Occupational Therapy Treatment  Patient Details  Name: Curtis Powell MRN: [id number] Date of Birth: [birth year] No data recorded  Encounter Date: 02/12/2018  OT End of Session - 02/12/18 1453    Visit Number  69    Number of Visits  23    Date for OT Re-Evaluation  04/08/18    Authorization Type  Visit 7 of 10 for progress reporting period starting 01/20/2018    OT Start Time  1445    OT Stop Time  1530    OT Time Calculation (min)  45 min    Activity Tolerance  Patient tolerated treatment well    Behavior During Therapy  [facility] for tasks assessed/performed       Past Medical History:  Diagnosis Date  . GERD (gastroesophageal reflux disease)   . Hyperlipidemia   . Hypertension   . Obesity   . Stroke [facility])     Past Surgical History:  Procedure Laterality Date  . BRAIN SURGERY      There were no vitals filed for this visit.  Subjective Assessment - 02/12/18 1451    Subjective   Pt reports that he is having a good day despite having complications with transportation arrangements today.    Pertinent History  Pt. is a 52 y.o. male who suffered a CVA on 05/01/2016. Pt. was admitted to the hospital. Once discharged, he received Home Health PT and OT services for about a month. Pt. has had multiple CVAs over the past 8 years, and has had multiple falls in the past 6 months. Pt. resides in an apartment. Pt. has caregivers for 80 hours. Pt.'s mother stays with pt. at night, and assists with IADL tasks.      Patient Stated Goals  To be able to throw a ball and dribble a ball.    Currently in Pain?  No/denies    Pain Score  0-No pain    Multiple Pain Sites  No      OT TREATMENT  Therapeutic exercise:   Pt. performed 2# dowel exercise for BUE strengthening secondary to weakness. Exercises included bilateral shoulder flexion, chest  press, circular patterns, and elbow flexion/extension were performed. Pt was able to maintain appropriate breathing during exercises today and counted reps aloud. Pt. then worked on reaching for [employer], and moving them through 4 levels of horizontal rungs placed at progressively higher heights from a seated position. Pt is unable to reach the 4th and highest rung and demonstrates increased exertion when reaching for 3rd rung. Pt. Required tactile cues, support, and assist proximally at his elbow. Pt required frequent rest breaks during reaching task, especially when reaching higher.                    OT Education - 02/12/18 1453    Education provided  Yes    Education Details  BUE strengthening and reaching    Person(s) Educated  Patient    Methods  Explanation;Demonstration    Comprehension  Verbalized understanding;Returned demonstration;Verbal cues required       OT Short Term Goals - 03/03/17 1459      OT SHORT TERM GOAL #1   Title  `        OT Long Term Goals - 01/14/18 1351      OT LONG TERM GOAL #1   Title  Pt.  will UE functioning to be able to perfrom toilet hygiene with minimal assistance.    Baseline  01/14/2018 - Pt completes toileting hygiene independently. Pt requires assistance managing LB clothing.    Time  12    Period  Weeks    Status  Achieved    Target Date  01/14/18      OT LONG TERM GOAL #2   Title  Pt. will complete self grooming with minA    Baseline  01/14/2018 - Pt is independent with oral care and washing his face and head but requires assistance for shaving.    Time  12    Period  Weeks    Status  On-going    Target Date  04/08/18      OT LONG TERM GOAL #3   Title  Pt. will perform self-feeding skills with modified indepndence    Baseline  01/14/2018 - Pt is able to complete self-feeding independently with minimal spillage. Pt is unable to cut meat at this time.    Time  12    Period  Weeks    Status  Achieved    Target Date   01/14/18      OT LONG TERM GOAL #4   Title  Pt. will perform self dressing with minA and A/E as needed.    Baseline  01/14/2018 - Pt. continues to require extensive assist for LB dressing due to balance deficits: Pt. requires min A for UB dressing.    Time  12    Period  Weeks    Status  On-going    Target Date  04/08/18      OT LONG TERM GOAL #5   Title  Pt. will perform light home making tasks with minA    Baseline  01/14/2018 - Pt is able to wash dishes while seated in w/c with increased accessibility of new apartment. Pt does not complete laundry nor sweeping, vacuuming, and dusting.     Time  12    Period  Weeks    Status  Achieved    Target Date  01/14/18      Long Term Additional Goals   Additional Long Term Goals  Yes      OT LONG TERM GOAL #6   Title  Pt. will write his name efficiently with 100% legibility    Baseline  01/14/2018 - ~80% legibility for name. Requires increased time to do so.    Time  8    Period  Weeks    Status  On-going    Target Date  03/11/18      OT LONG TERM GOAL #7   Title  Pt will independently and consistently follow HEP to increase UE strength to increase functional independence    Baseline  01/14/2018 - Pt does not have a HEP at this time    Time  6    Period  Weeks    Status  New    Target Date  02/25/18            Plan - 02/12/18 1454    Clinical Impression Statement  Pt presents with RUE muscle weakness and limited RUE ROM. Pt continues to work to increase UE strength and ROM. Pt requires increased time and frequent rest breaks during tasks that require strength and ROM including reaching into cabinetry, bathing, and dressing. Pt to continue to work on improving UE strength and ROM to promote independence during ADLs and IADLs.     Occupational performance deficits (  Please refer to evaluation for details):  ADL's;IADL's    Rehab Potential  Fair    OT Frequency  2x / week    OT Duration  12 weeks    OT Treatment/Interventions   Self-care/ADL training;Patient/family education;DME and/or AE instruction;Therapeutic exercise;Moist Heat;Neuromuscular education    Clinical Decision Making  Several treatment options, min-mod task modification necessary    Consulted and Agree with Plan of Care  Patient       Patient will benefit from skilled therapeutic intervention in order to improve the following deficits and impairments:  Pain, Decreased cognition, Decreased activity tolerance, Impaired UE functional use, Decreased range of motion, Decreased coordination, Decreased endurance  Visit Diagnosis: Muscle weakness (generalized)    Problem List Patient Active Problem List   Diagnosis Date Noted  . Acute CVA (cerebrovascular accident) ([place]) 05/02/2016  . Left-sided weakness 05/01/2016    Oliver Hum, OTS 02/12/2018, 3:37 PM   This entire session was performed under direct supervision and direction of a licensed therapist/therapist assistant . I have personally read, edited and approve of the note as written.  Harrel Carina, MS, OTR/L  [place] MAIN [facility] SERVICES [address] [place], [place], [address] Phone: [phone]   Fax:  [phone]  Name: TREMELL REIMERS MRN: [id number] Date of Birth: 1966/02/03

## 2018-02-17 ENCOUNTER — Ambulatory Visit: Payer: Medicare HMO | Admitting: Occupational Therapy

## 2018-02-17 ENCOUNTER — Ambulatory Visit: Payer: Medicare HMO

## 2018-02-17 DIAGNOSIS — R278 Other lack of coordination: Secondary | ICD-10-CM

## 2018-02-17 DIAGNOSIS — I69351 Hemiplegia and hemiparesis following cerebral infarction affecting right dominant side: Secondary | ICD-10-CM

## 2018-02-17 DIAGNOSIS — M6281 Muscle weakness (generalized): Secondary | ICD-10-CM

## 2018-02-17 DIAGNOSIS — R2689 Other abnormalities of gait and mobility: Secondary | ICD-10-CM

## 2018-02-17 NOTE — Therapy (Addendum)
[place] MAIN [facility] SERVICES [address] [place], [place], [address] Phone: [phone]   Fax:  [phone]  Physical Therapy Treatment  Patient Details  Name: Curtis Powell MRN: [id number] Date of Birth: [birth year] No data recorded  Encounter Date: 02/17/2018  PT End of Session - 02/17/18 1136    Visit Number  55    Number of Visits  62    Date for PT Re-Evaluation  04/09/18    Authorization Type  PN 1/10 starts 11/7)    PT Start Time  1130    PT Stop Time  1213    PT Time Calculation (min)  43 min    Equipment Utilized During Treatment  Gait belt    Activity Tolerance  Patient tolerated treatment well    Behavior During Therapy  WFL for tasks assessed/performed       Past Medical History:  Diagnosis Date  . GERD (gastroesophageal reflux disease)   . Hyperlipidemia   . Hypertension   . Obesity   . Stroke [facility])     Past Surgical History:  Procedure Laterality Date  . BRAIN SURGERY      There were no vitals filed for this visit.  Subjective Assessment - 02/17/18 1135    Subjective  Patient states he had a good weekend. States he walked yesterday. Denies any falls or LOB since last session. States he has been going back and forth to bathroom by himself.     Pertinent History   Patient is a pleasant 52 year old male who presents to physical therapy for weakness and immobility secondary to CVA.  Had a stroke in Feb 2018. He was previously fully independent, but this stroke caused severe residual deficits, mainly on the right side as well as speech, and now he is unable to walk or perform most of his ADLs on his own. Entire right side is very weak. He still has a little difficulty with speech but his swallowing is improved to baseline. His mom had to move in with him and now is his main caregiver.     Limitations  Sitting;Lifting;Standing;Walking;House hold activities    How long can you sit comfortably?  5 minutes    How long can  you stand comfortably?  5 minutes    How long can you walk comfortably?  10 ft    Patient Stated Goals  Walk without walker, walk further with walker, get some strength in back     Currently in Pain?  No/denies             Transfer to and from nustep from wheelchair to RW via stand step pivot CGA   Nustep lvl 4 4 minutes >60rpm for cardiovascular challenge and speed for motor initiation.   Sit to stands from W/C. x5 with 10second holds in upright posture. Verbal cues to stand tall and squeeze glutes. CGA  Sit to stands from W/C x5 with 5-10 balloon taps while standing. Verbal cues for upright posture and stand tall.1 UE assist. CGA  In //bars:    Modified tandem stance with each leg shifting weight anteriorly to promote glute activation in front of mirror. Tactile cueing at ASIS and PSIS for appropriate anterior weight shift. Verbal cues to hold anterior shift for 5 seconds for sustained activation.CGA approximately 1 minute    Standing step overs x6 each LE. Stepping over foam roller. Verbal cues for upright posture and anterior weight shift. Tactile cues for glute activation. CGA  Forward walking. Tactile and verbal cues for anterior weight shift followed by step with each LE. x1 length of bars. CGA   Seated marches x15 each leg. Verbal cues to increase speed of movement to increase height of movements for carryover to ambulatory mechanics.   Seated LAQ x15  each Lalternating movement LE . Verbal cues for increased speed.   Seated soccer ball kicks x10 with each foot for increased speed and LE reaction time.     Bathroom mobility performed in middle of session. Addressed standing balance and mobility                 PT Education - 02/17/18 1136    Education provided  Yes    Education Details  exercise technique, gait, speed,     Person(s) Educated  Patient    Methods  Explanation;Demonstration;Verbal cues    Comprehension  Verbalized understanding;Returned  demonstration       PT Short Term Goals - 02/12/18 1422      PT SHORT TERM GOAL #1   Title  Patient will be independent in home exercise program to improve strength/mobility for better functional independence with ADLs.    Baseline  hep compliant    Time  2    Period  Weeks    Status  Achieved      PT SHORT TERM GOAL #2   Title  Patient will require min cueing for STS transfer with CGA for increased independence with mobility.     Baseline  CGA    Time  2    Period  Weeks    Status  Achieved      PT SHORT TERM GOAL #3   Title  Patient will maintain upright posture for > 15 seconds to demonstrate strengthened postural control muscualture     Baseline  18 seconds     Time  2    Period  Weeks    Status  Achieved        PT Long Term Goals - 02/12/18 1337      PT LONG TERM GOAL #1   Title  Patient will perform bed mobility with Supervision to increase independent mobility    Baseline  able to perform mod I for increased time , occasional Min A for scooting up in bed; 7/16: min guard for safety with increased effort and time, cues to scoot back in bed before sit>supine 9/12: can do it independently but with difficulty    Time  8    Period  Weeks    Status  Achieved      PT LONG TERM GOAL #2   Title  Patient (< 32 years old) will complete five times sit to stand test in < 10 seconds indicating an increased LE strength and improved balance.    Baseline  2/18: 30 seconds from plinth chair height single UE on seat and single on walker ; 32 seconds from plinth with single UE on seat and single on walker. 4/10: 26 seconds from [facility] with one hand on walker5/29: 22.2 seconds with one hand on walker one hand on WC; 7/16: one hand on walker one hand on WC 18.96 seconds; pt achieving 75% of full upright standing 8/22:one hand on walker one hand on WC 18 seconds; pt achieving 75% of full upright standing  9/12: 18 seconds econds; pt achieving 75% of full upright standing 8/22:one hand on walker  one hand on WC 10/15: 17seconds; pt achieving 75% of full upright posture 11/7:  16 seconds; pt achieving 75 upright posture    Time  8    Period  Weeks    Status  Partially Met    Target Date  04/09/18      PT LONG TERM GOAL #3   Title  Patient will ambulate 40 ft with walker with no breaks to allow for increased mobility within home.     Baseline  12/26: 29 ft with walker and WC follow with no rest breaks; 2/18: multiple trials previous session of >100 ft     Time  8    Period  Weeks    Status  Achieved      PT LONG TERM GOAL #4   Title  Patient will increase BLE gross strength to 4+/5 as to improve functional strength for independent gait, increased standing tolerance and increased ADL ability.    Baseline   4-/5 RLE, 10/15: 4/5 RLE 11/7: 4/5 RLE    Time  8    Period  Weeks    Status  Partially Met    Target Date  04/09/18      PT LONG TERM GOAL #5   Title  Patient will increase lower extremity functional scale to >40/80 to demonstrate improved functional mobility and increased tolerance with ADLs.     Baseline  14/80; 12/26: 21/80; 2/18: 28/80  4/10; 25/80 5/29: 27/80; 7/16: 29/80 8/22: 30/80  9/12: 26/80 (feeling more tired due to moving) 10/15: 37/80 11/7: 23/80    Time  8    Period  Weeks    Status  Partially Met    Target Date  04/09/18      Additional Long Term Goals   Additional Long Term Goals  Yes      PT LONG TERM GOAL #6   Title  Patient will walk through a door involving one small step up/down to increase functional independence.     Baseline  require CGA  BUE support in // bars at this time 10/15: required BUE and CGA in //bars    Time  8    Period  Weeks    Status  Deferred      PT LONG TERM GOAL #7   Title  Patient will ambulate 40 ft with least assistive device and Supervision with no breaks to allow for increased mobility within home.     Baseline  requires use of walker and CGA     Time  8    Period  Weeks    Status  Achieved      PT LONG TERM GOAL #8    Title  Patient will perform 10 MWT in < 1 minute to demonstrate improved ambulatory velocity and capacity.     Baseline  4/10: 2 minutes 45 seconds; 5/29: 2 minute 31 seconds; 3 minutes 22 seconds 8/22: 3 minutes 2 seconds  9/12:  2 minutes and 43 seconds 10/15: 68mnute 12 seconds 11/7: 1 minute 21 seconds    Time  8    Period  Weeks    Status  Partially Met    Target Date  04/09/18      PT LONG TERM GOAL  #9   TITLE  Patient will increase Berg Balance score by > 6 points (26/56) to demonstrate decreased fall risk during functional activities.    Baseline  9/12: 20/56 10/15: 21/56 11/7: 20/56    Time  8    Period  Weeks    Status  Partially Met    Target Date  04/09/18      PT LONG TERM GOAL  #10   TITLE  Patient (< 8 years old) will complete five times sit to stand test with full upright posture in < 30 seconds indicating an increased glute activation, LE strength and improved balance.    Baseline  10/15: 37seconds 11/7: 20 seconds    Time  8    Period  Weeks    Status  Achieved      PT LONG TERM GOAL  #11   TITLE  Patient will ambulate 100 ft with least assistive device and Supervision with no breaks to allow for increased mobility within home.     Baseline  10/17: 43f 11/7: 1064f   Time  8    Period  Weeks    Status  Achieved      PT LONG TERM GOAL  #12   TITLE  Patient (< 6045ears old) will complete five times sit to stand test with full upright posture in < 15 seconds indicating increased glute activation, LE strength and improved balance.    Baseline  11/7: 20 seconds    Time  8    Period  Weeks    Status  New    Target Date  04/09/18      PT LONG TERM GOAL  #13   TITLE  Patient will ambulate 150 ft with least assistive device and Supervision with no breaks to allow for increased mobility within home.     Baseline  11/7: 10024f  Time  8    Period  Weeks    Status  New    Target Date  04/09/18            Plan - 02/17/18 1216    Clinical Impression  Statement  Session continued to focus on glute strength and activation with sit to stands and ambulation in parallel bars. Patient requires cues for upright posture and glute activation due to weakness and fatigue resulting in flexed trunk  impacting standing posture and gait mechanics. Patient still has difficulty with standing balloon taps with inability to complete without UE assistance due to LOB. Patient demonstrated improved speed with seated marches with speed being a continued focus in future sessions to increase gait speed. Patient will continue to benefit from skilled physical therapy to improve strength, balance, and functional mobility.     Clinical Impairments Affecting Rehab Potential  hx of HTN, HLD, CVA, learning disability, Diabetes, brain tumor,     PT Frequency  2x / week    PT Duration  8 weeks    PT Treatment/Interventions  ADLs/Self Care Home Management;Aquatic Therapy;Ultrasound;Moist Heat;Traction;DME Instruction;Gait training;Stair training;Functional mobility training;Therapeutic activities;Therapeutic exercise;Orthotic Fit/Training;Neuromuscular re-education;Balance training;Patient/family education;Manual techniques;Wheelchair mobility training;Passive range of motion;Energy conservation;Taping;Visual/perceptual remediation/compensation    PT Next Visit Plan  balance and stability and ambulation, anterior weight shift hold    PT Home Exercise Plan  TrA contraction     Consulted and Agree with Plan of Care  Patient       Patient will benefit from skilled therapeutic intervention in order to improve the following deficits and impairments:  Abnormal gait, Decreased activity tolerance, Decreased balance, Decreased knowledge of precautions, Decreased endurance, Decreased coordination, Decreased knowledge of use of DME, Decreased mobility, Decreased range of motion, Difficulty walking, Decreased safety awareness, Decreased strength, Impaired flexibility, Impaired perceived functional  ability, Impaired tone, Postural dysfunction, Improper body mechanics, Pain  Visit Diagnosis: Muscle weakness (generalized)  Other lack of coordination  Other  abnormalities of gait and mobility  Hemiplegia and hemiparesis following cerebral infarction affecting right dominant side [facility])     Problem List Patient Active Problem List   Diagnosis Date Noted  . Acute CVA (cerebrovascular accident) ([place]) 05/02/2016  . Left-sided weakness 05/01/2016   Erick Blinks, SPT  This entire session was performed under direct supervision and direction of a licensed therapist/therapist assistant . I have personally read, edited and approve of the note as written.  Janna Arch, PT, DPT   02/17/2018, 2:19 PM  [place] MAIN [facility] SERVICES [address] [place], [place], [address] Phone: [phone]   Fax:  [phone]  Name: Curtis Powell MRN: [id number] Date of Birth: June 14, 1965

## 2018-02-18 ENCOUNTER — Encounter: Payer: Self-pay | Admitting: Occupational Therapy

## 2018-02-18 NOTE — Therapy (Signed)
[place] MAIN [facility] SERVICES [address] [place], [place], [address] Phone: [phone]   Fax:  [phone]  Occupational Therapy Treatment  Patient Details  Name: Curtis Powell MRN: [id number] Date of Birth: 09/29/1965 No data recorded  Encounter Date: 02/17/2018  OT End of Session - 02/18/18 2146    Visit Number  47    Number of Visits  12    Date for OT Re-Evaluation  04/08/18    Authorization Type  Visit 8 of 10 for progress reporting period starting 01/20/2018    OT Start Time  1301    OT Stop Time  1355    OT Time Calculation (min)  54 min    Activity Tolerance  Patient tolerated treatment well    Behavior During Therapy  [facility] for tasks assessed/performed       Past Medical History:  Diagnosis Date  . GERD (gastroesophageal reflux disease)   . Hyperlipidemia   . Hypertension   . Obesity   . Stroke [facility])     Past Surgical History:  Procedure Laterality Date  . BRAIN SURGERY      There were no vitals filed for this visit.  Subjective Assessment - 02/18/18 2146    Subjective   Patient denies any pain this date, reports his transportation dropped him off really early for appt today.  Therapists were able to modify patients schedule to accommodate seeing him at an earlier time to decrease his wait in the clinic.  Patient appreciative of the gesture.     Pertinent History  Pt. is a 52 y.o. male who suffered a CVA on 05/01/2016. Pt. was admitted to the hospital. Once discharged, he received Home Health PT and OT services for about a month. Pt. has had multiple CVAs over the past 8 years, and has had multiple falls in the past 6 months. Pt. resides in an apartment. Pt. has caregivers for 80 hours. Pt.'s mother stays with pt. at night, and assists with IADL tasks.      Patient Stated Goals  To be able to throw a ball and dribble a ball.    Currently in Pain?  No/denies    Pain Score  0-No pain         Therapeutic Exercise:   Patient seen this date for strengthening tasks with use of Shape tower in sitting with reaching with right hand to complete 2 levels of reach and then performing with left hand, 2 levels.  Cues for reaching and weight shifting to reach top of 2nd level, therapist assisted with a couple of times with guiding.    Neuromuscular re-education: Pt performing manipulation of small Washers to place on magnet hooks with extended reach and then removing one by one with right and then left hands.  Cues at times for prehension patterns and to extend his reach, magnetic board placed onto elevated surface box to encourage greater reach.  Self Care:  Toileting- patient requires assist to navigate to the bathroom, sit to stand with min guard for toileting, able to manage clothing with min guard for balance.  Hand hygiene from seated position.                    OT Education - 02/18/18 2146    Education provided  Yes    Education Details  self care, strength, [facility]    Person(s) Educated  Patient    Methods  Explanation;Demonstration    Comprehension  Verbalized understanding;Returned  demonstration;Verbal cues required       OT Short Term Goals - 03/03/17 1459      OT SHORT TERM GOAL #1   Title  `        OT Long Term Goals - 01/14/18 1351      OT LONG TERM GOAL #1   Title  Pt. will UE functioning to be able to perfrom toilet hygiene with minimal assistance.    Baseline  01/14/2018 - Pt completes toileting hygiene independently. Pt requires assistance managing LB clothing.    Time  12    Period  Weeks    Status  Achieved    Target Date  01/14/18      OT LONG TERM GOAL #2   Title  Pt. will complete self grooming with minA    Baseline  01/14/2018 - Pt is independent with oral care and washing his face and head but requires assistance for shaving.    Time  12    Period  Weeks    Status  On-going    Target Date  04/08/18      OT LONG TERM GOAL #3   Title  Pt. will perform self-feeding  skills with modified indepndence    Baseline  01/14/2018 - Pt is able to complete self-feeding independently with minimal spillage. Pt is unable to cut meat at this time.    Time  12    Period  Weeks    Status  Achieved    Target Date  01/14/18      OT LONG TERM GOAL #4   Title  Pt. will perform self dressing with minA and A/E as needed.    Baseline  01/14/2018 - Pt. continues to require extensive assist for LB dressing due to balance deficits: Pt. requires min A for UB dressing.    Time  12    Period  Weeks    Status  On-going    Target Date  04/08/18      OT LONG TERM GOAL #5   Title  Pt. will perform light home making tasks with minA    Baseline  01/14/2018 - Pt is able to wash dishes while seated in w/c with increased accessibility of new apartment. Pt does not complete laundry nor sweeping, vacuuming, and dusting.     Time  12    Period  Weeks    Status  Achieved    Target Date  01/14/18      Long Term Additional Goals   Additional Long Term Goals  Yes      OT LONG TERM GOAL #6   Title  Pt. will write his name efficiently with 100% legibility    Baseline  01/14/2018 - ~80% legibility for name. Requires increased time to do so.    Time  8    Period  Weeks    Status  On-going    Target Date  03/11/18      OT LONG TERM GOAL #7   Title  Pt will independently and consistently follow HEP to increase UE strength to increase functional independence    Baseline  01/14/2018 - Pt does not have a HEP at this time    Time  6    Period  Weeks    Status  New    Target Date  02/25/18            Plan - 02/18/18 2150    Clinical Impression Statement  Patient requires assist to navigate to bathroom  for toileting, completed sit to stand transfer and toileting in standing with min guard this date and use of grab bar.  Patient continues to demonstrate muscle weakness which limits his performance in daily activities.  He requires cues for techniques for exercises and continues to benefit  from skilled OT services to maximize safety and independence in daily tasks.     Occupational performance deficits (Please refer to evaluation for details):  ADL's;IADL's    Rehab Potential  Fair    OT Frequency  2x / week    OT Duration  12 weeks    OT Treatment/Interventions  Self-care/ADL training;Patient/family education;DME and/or AE instruction;Therapeutic exercise;Moist Heat;Neuromuscular education    Consulted and Agree with Plan of Care  Patient       Patient will benefit from skilled therapeutic intervention in order to improve the following deficits and impairments:  Pain, Decreased cognition, Decreased activity tolerance, Impaired UE functional use, Decreased range of motion, Decreased coordination, Decreased endurance  Visit Diagnosis: Muscle weakness (generalized)  Other lack of coordination  Hemiplegia and hemiparesis following cerebral infarction affecting right dominant side [facility])    Problem List Patient Active Problem List   Diagnosis Date Noted  . Acute CVA (cerebrovascular accident) ([place]) 05/02/2016  . Left-sided weakness 05/01/2016   Gavriel Holzhauer Oneita Jolly, OTR/L, CLT  Carriann Hesse 02/18/2018, 9:53 PM  [place] MAIN [facility] SERVICES [address] [place], [place], [address] Phone: [phone]   Fax:  [phone]  Name: Curtis Powell MRN: [id number] Date of Birth: 11/12/1965

## 2018-02-19 ENCOUNTER — Encounter: Payer: Self-pay | Admitting: Occupational Therapy

## 2018-02-19 ENCOUNTER — Ambulatory Visit: Payer: Medicare HMO

## 2018-02-19 ENCOUNTER — Ambulatory Visit: Payer: Medicare HMO | Admitting: Occupational Therapy

## 2018-02-19 DIAGNOSIS — R278 Other lack of coordination: Secondary | ICD-10-CM

## 2018-02-19 DIAGNOSIS — M6281 Muscle weakness (generalized): Secondary | ICD-10-CM

## 2018-02-19 DIAGNOSIS — I69351 Hemiplegia and hemiparesis following cerebral infarction affecting right dominant side: Secondary | ICD-10-CM

## 2018-02-19 DIAGNOSIS — R2689 Other abnormalities of gait and mobility: Secondary | ICD-10-CM

## 2018-02-19 NOTE — Therapy (Addendum)
[name] MAIN [facility] SERVICES [address] [place], [place], [address] Phone: [phone]   Fax:  [phone]  Occupational Therapy Treatment  Patient Details  Name: Curtis Powell MRN: [id number] Date of Birth: 1965/12/10 No data recorded  Encounter Date: 02/19/2018  OT End of Session - 02/19/18 1352    Visit Number  88    Number of Visits  66    Date for OT Re-Evaluation  04/08/18    Authorization Type  Visit 9 of 10 for progress reporting period starting 01/20/2018    OT Start Time  1300    OT Stop Time  1345    OT Time Calculation (min)  45 min    Activity Tolerance  Patient tolerated treatment well    Behavior During Therapy  [facility] for tasks assessed/performed       Past Medical History:  Diagnosis Date  . GERD (gastroesophageal reflux disease)   . Hyperlipidemia   . Hypertension   . Obesity   . Stroke [facility])     Past Surgical History:  Procedure Laterality Date  . BRAIN SURGERY      There were no vitals filed for this visit.  Subjective Assessment - 02/19/18 1302    Subjective   Pt arrived an hour early for today's appointment due to transportation scheduling. Pt reports having a good day.    Pertinent History  Pt. is a 52 y.o. male who suffered a CVA on 05/01/2016. Pt. was admitted to the hospital. Once discharged, he received Home Health PT and OT services for about a month. Pt. has had multiple CVAs over the past 8 years, and has had multiple falls in the past 6 months. Pt. resides in an apartment. Pt. has caregivers for 80 hours. Pt.'s mother stays with pt. at night, and assists with IADL tasks.      Patient Stated Goals  To be able to throw a ball and dribble a ball.    Currently in Pain?  No/denies    Pain Score  0-No pain      OT TREATMENT  Self-care:   Pt completed seated LB dressing tasks that required him to work on shifting weight laterally, hiking his hips, and threading his feet through dressing loops. Pt  demonstrates increased exertion and fatigue when shifting body weight while seated. Pt requires increased verbal and tactile cuing to hike hips enough to clear from the surface. Pt does not demonstrate difficulty reaching towards the floor to thread feet through dressing loops, however demonstrates increased exertion. Pt was educated on proper breathing techniques when reaching towards the floor as pt tends to hold his breath or breathe shallowly and become lightheaded.                   OT Education - 02/19/18 1351    Education provided  Yes    Education Details  LB dressing    Person(s) Educated  Patient    Methods  Explanation;Demonstration    Comprehension  Verbalized understanding;Returned demonstration;Verbal cues required       OT Short Term Goals - 03/03/17 1459      OT SHORT TERM GOAL #1   Title  `        OT Long Term Goals - 01/14/18 1351      OT LONG TERM GOAL #1   Title  Pt. will UE functioning to be able to perfrom toilet hygiene with minimal assistance.    Baseline  01/14/2018 -  Pt completes toileting hygiene independently. Pt requires assistance managing LB clothing.    Time  12    Period  Weeks    Status  Achieved    Target Date  01/14/18      OT LONG TERM GOAL #2   Title  Pt. will complete self grooming with minA    Baseline  01/14/2018 - Pt is independent with oral care and washing his face and head but requires assistance for shaving.    Time  12    Period  Weeks    Status  On-going    Target Date  04/08/18      OT LONG TERM GOAL #3   Title  Pt. will perform self-feeding skills with modified indepndence    Baseline  01/14/2018 - Pt is able to complete self-feeding independently with minimal spillage. Pt is unable to cut meat at this time.    Time  12    Period  Weeks    Status  Achieved    Target Date  01/14/18      OT LONG TERM GOAL #4   Title  Pt. will perform self dressing with minA and A/E as needed.    Baseline  01/14/2018 - Pt.  continues to require extensive assist for LB dressing due to balance deficits: Pt. requires min A for UB dressing.    Time  12    Period  Weeks    Status  On-going    Target Date  04/08/18      OT LONG TERM GOAL #5   Title  Pt. will perform light home making tasks with minA    Baseline  01/14/2018 - Pt is able to wash dishes while seated in w/c with increased accessibility of new apartment. Pt does not complete laundry nor sweeping, vacuuming, and dusting.     Time  12    Period  Weeks    Status  Achieved    Target Date  01/14/18      Long Term Additional Goals   Additional Long Term Goals  Yes      OT LONG TERM GOAL #6   Title  Pt. will write his name efficiently with 100% legibility    Baseline  01/14/2018 - ~80% legibility for name. Requires increased time to do so.    Time  8    Period  Weeks    Status  On-going    Target Date  03/11/18      OT LONG TERM GOAL #7   Title  Pt will independently and consistently follow HEP to increase UE strength to increase functional independence    Baseline  01/14/2018 - Pt does not have a HEP at this time    Time  6    Period  Weeks    Status  New    Target Date  02/25/18            Plan - 02/19/18 1353    Clinical Impression Statement  Pt worked on [place] and toileting routine. Pt presents with difficulty shift weight while seated to hike pants over hips. Pt requires increased tactile and verbal cuing to shift weight and hike hips at an appropriate height. Pt quickly fatigues during LB dressing. Pt completed full toileting routine. Pt required min assist sit<>stand and to pull LB garments over hips. Pt is unable to maintain balance without keeping one hand on the grab bars. Pt to continue to work on LB dressing to increase independence and  safety during ADLs.    Occupational performance deficits (Please refer to evaluation for details):  ADL's;IADL's    Rehab Potential  Fair    OT Frequency  2x / week    OT Duration  12 weeks     OT Treatment/Interventions  Self-care/ADL training;Patient/family education;DME and/or AE instruction;Therapeutic exercise;Moist Heat;Neuromuscular education    Clinical Decision Making  Several treatment options, min-mod task modification necessary    Consulted and Agree with Plan of Care  Patient       Patient will benefit from skilled therapeutic intervention in order to improve the following deficits and impairments:  Pain, Decreased cognition, Decreased activity tolerance, Impaired UE functional use, Decreased range of motion, Decreased coordination, Decreased endurance  Visit Diagnosis: Muscle weakness (generalized)    Problem List Patient Active Problem List   Diagnosis Date Noted  . Acute CVA (cerebrovascular accident) ([place]) 05/02/2016  . Left-sided weakness 05/01/2016    Oliver Hum, OTS 02/19/2018, 1:59 PM   This entire session was performed under direct supervision and direction of a licensed therapist/therapist assistant . I have personally read, edited and approve of the note as written.  Harrel Carina, MS, OTR/L   [place] MAIN [facility] SERVICES [address] [place], [place], [address] Phone: [phone]   Fax:  [phone]  Name: BROCK LARMON MRN: [id number] Date of Birth: 21-Apr-1965

## 2018-02-19 NOTE — Therapy (Addendum)
[place] MAIN [facility] SERVICES [address] [place], [place], [address] Phone: [phone]   Fax:  [phone]  Physical Therapy Treatment  Patient Details  Name: Curtis Powell MRN: [id number] Date of Birth: 05-18-1965 No data recorded  Encounter Date: 02/19/2018  PT End of Session - 02/19/18 1352    Visit Number  27    Number of Visits  37    Date for PT Re-Evaluation  04/09/18    Authorization Type  PN 2/10 starts 11/7)    PT Start Time  1345    PT Stop Time  1429    PT Time Calculation (min)  44 min    Equipment Utilized During Treatment  Gait belt    Activity Tolerance  Patient tolerated treatment well    Behavior During Therapy  WFL for tasks assessed/performed       Past Medical History:  Diagnosis Date  . GERD (gastroesophageal reflux disease)   . Hyperlipidemia   . Hypertension   . Obesity   . Stroke [facility])     Past Surgical History:  Procedure Laterality Date  . BRAIN SURGERY      There were no vitals filed for this visit.  Subjective Assessment - 02/19/18 1350    Subjective  Patient reports he is having a good day today. Went to the bathroom during OT so doesn't need to go at beginning of session. Reports no stumbles or falls since last session.     Pertinent History   Patient is a pleasant 52 year old male who presents to physical therapy for weakness and immobility secondary to CVA.  Had a stroke in Feb 2018. He was previously fully independent, but this stroke caused severe residual deficits, mainly on the right side as well as speech, and now he is unable to walk or perform most of his ADLs on his own. Entire right side is very weak. He still has a little difficulty with speech but his swallowing is improved to baseline. His mom had to move in with him and now is his main caregiver.     Limitations  Sitting;Lifting;Standing;Walking;House hold activities    How long can you sit comfortably?  5 minutes    How long can  you stand comfortably?  5 minutes    How long can you walk comfortably?  10 ft    Patient Stated Goals  Walk without walker, walk further with walker, get some strength in back     Currently in Pain?  No/denies      Lvl 5 4 minutes RPM> 60 seconds  Transfers: SPT with RW and CGA x2 trials  // bars:    Balloon taps reaching inside and outside BOS 2 minutes: tandem stance 60 seconds each foot   Forward.   Speed ladder: Foot in each square (able to perform 30%) of the time. Tactile cueing at ASIS and PSIS for appropriateanterior weightshift.Verbal cues to hold anterior shift for 5 seconds for sustained activation.CGA.  Standing side step with cueing for lifting leg prior to step to allow for improved foot clearance, CGA 2x length of bars.   Standing step back 10x each LE. CGA; BUE support ; cues for hand placement to promote upright posture  BUE support mini knubby bosu taps with single UE at a time, cues for flexing knee and hip for clearance, 6x each LE, verbal and tactile cues for upright posture  Seated:   Heel toe raises 20x on wobbleboard  Seated LAQx 15each LE. Verbal cues to increase speed of movement for carryover to gait mechanics and velocity. reciprocal movement                            PT Education - 02/19/18 1352    Education provided  Yes    Education Details  exercise technique, stability     Person(s) Educated  Patient    Methods  Explanation;Demonstration;Verbal cues    Comprehension  Verbalized understanding;Returned demonstration       PT Short Term Goals - 02/12/18 1422      PT SHORT TERM GOAL #1   Title  Patient will be independent in home exercise program to improve strength/mobility for better functional independence with ADLs.    Baseline  hep compliant    Time  2    Period  Weeks    Status  Achieved      PT SHORT TERM GOAL #2   Title  Patient will require min cueing for STS transfer with CGA for increased  independence with mobility.     Baseline  CGA    Time  2    Period  Weeks    Status  Achieved      PT SHORT TERM GOAL #3   Title  Patient will maintain upright posture for > 15 seconds to demonstrate strengthened postural control muscualture     Baseline  18 seconds     Time  2    Period  Weeks    Status  Achieved        PT Long Term Goals - 02/12/18 1337      PT LONG TERM GOAL #1   Title  Patient will perform bed mobility with Supervision to increase independent mobility    Baseline  able to perform mod I for increased time , occasional Min A for scooting up in bed; 7/16: min guard for safety with increased effort and time, cues to scoot back in bed before sit>supine 9/12: can do it independently but with difficulty    Time  8    Period  Weeks    Status  Achieved      PT LONG TERM GOAL #2   Title  Patient (< 29 years old) will complete five times sit to stand test in < 10 seconds indicating an increased LE strength and improved balance.    Baseline  2/18: 30 seconds from plinth chair height single UE on seat and single on walker ; 32 seconds from plinth with single UE on seat and single on walker. 4/10: 26 seconds from [facility] with one hand on walker5/29: 22.2 seconds with one hand on walker one hand on WC; 7/16: one hand on walker one hand on WC 18.96 seconds; pt achieving 75% of full upright standing 8/22:one hand on walker one hand on WC 18 seconds; pt achieving 75% of full upright standing  9/12: 18 seconds econds; pt achieving 75% of full upright standing 8/22:one hand on walker one hand on WC 10/15: 17seconds; pt achieving 75% of full upright posture 11/7: 16 seconds; pt achieving 75 upright posture    Time  8    Period  Weeks    Status  Partially Met    Target Date  04/09/18      PT LONG TERM GOAL #3   Title  Patient will ambulate 40 ft with walker with no breaks to allow for increased mobility within home.  Baseline  12/26: 29 ft with walker and WC follow with no rest  breaks; 2/18: multiple trials previous session of >100 ft     Time  8    Period  Weeks    Status  Achieved      PT LONG TERM GOAL #4   Title  Patient will increase BLE gross strength to 4+/5 as to improve functional strength for independent gait, increased standing tolerance and increased ADL ability.    Baseline   4-/5 RLE, 10/15: 4/5 RLE 11/7: 4/5 RLE    Time  8    Period  Weeks    Status  Partially Met    Target Date  04/09/18      PT LONG TERM GOAL #5   Title  Patient will increase lower extremity functional scale to >40/80 to demonstrate improved functional mobility and increased tolerance with ADLs.     Baseline  14/80; 12/26: 21/80; 2/18: 28/80  4/10; 25/80 5/29: 27/80; 7/16: 29/80 8/22: 30/80  9/12: 26/80 (feeling more tired due to moving) 10/15: 37/80 11/7: 23/80    Time  8    Period  Weeks    Status  Partially Met    Target Date  04/09/18      Additional Long Term Goals   Additional Long Term Goals  Yes      PT LONG TERM GOAL #6   Title  Patient will walk through a door involving one small step up/down to increase functional independence.     Baseline  require CGA  BUE support in // bars at this time 10/15: required BUE and CGA in //bars    Time  8    Period  Weeks    Status  Deferred      PT LONG TERM GOAL #7   Title  Patient will ambulate 40 ft with least assistive device and Supervision with no breaks to allow for increased mobility within home.     Baseline  requires use of walker and CGA     Time  8    Period  Weeks    Status  Achieved      PT LONG TERM GOAL #8   Title  Patient will perform 10 MWT in < 1 minute to demonstrate improved ambulatory velocity and capacity.     Baseline  4/10: 2 minutes 45 seconds; 5/29: 2 minute 31 seconds; 3 minutes 22 seconds 8/22: 3 minutes 2 seconds  9/12:  2 minutes and 43 seconds 10/15: 48mnute 12 seconds 11/7: 1 minute 21 seconds    Time  8    Period  Weeks    Status  Partially Met    Target Date  04/09/18      PT LONG  TERM GOAL  #9   TITLE  Patient will increase Berg Balance score by > 6 points (26/56) to demonstrate decreased fall risk during functional activities.    Baseline  9/12: 20/56 10/15: 21/56 11/7: 20/56    Time  8    Period  Weeks    Status  Partially Met    Target Date  04/09/18      PT LONG TERM GOAL  #10   TITLE  Patient (< 651years old) will complete five times sit to stand test with full upright posture in < 30 seconds indicating an increased glute activation, LE strength and improved balance.    Baseline  10/15: 37seconds 11/7: 20 seconds    Time  8  Period  Weeks    Status  Achieved      PT LONG TERM GOAL  #11   TITLE  Patient will ambulate 100 ft with least assistive device and Supervision with no breaks to allow for increased mobility within home.     Baseline  10/17: 11f 11/7: 10109f   Time  8    Period  Weeks    Status  Achieved      PT LONG TERM GOAL  #12   TITLE  Patient (< 6071ears old) will complete five times sit to stand test with full upright posture in < 15 seconds indicating increased glute activation, LE strength and improved balance.    Baseline  11/7: 20 seconds    Time  8    Period  Weeks    Status  New    Target Date  04/09/18      PT LONG TERM GOAL  #13   TITLE  Patient will ambulate 150 ft with least assistive device and Supervision with no breaks to allow for increased mobility within home.     Baseline  11/7: 10056f  Time  8    Period  Weeks    Status  New    Target Date  04/09/18            Plan - 02/19/18 1441    Clinical Impression Statement  Patient demonstrating improved ability to recruit posterior musculature for upright posture and gait mechanics. Patient is limited in foot clearance challenging his ability to perform step overs over speed ladder. Tethering of LE limit patient's ability to perform reciprocating motion of LE's that improved with repetition. Tandem stance continues to challenge patient due to multi task challenge of  task.  Patient will continue to benefit from skilled physical therapy to improve strength, balance, and functional mobility.    Clinical Impairments Affecting Rehab Potential  hx of HTN, HLD, CVA, learning disability, Diabetes, brain tumor,     PT Frequency  2x / week    PT Duration  8 weeks    PT Treatment/Interventions  ADLs/Self Care Home Management;Aquatic Therapy;Ultrasound;Moist Heat;Traction;DME Instruction;Gait training;Stair training;Functional mobility training;Therapeutic activities;Therapeutic exercise;Orthotic Fit/Training;Neuromuscular re-education;Balance training;Patient/family education;Manual techniques;Wheelchair mobility training;Passive range of motion;Energy conservation;Taping;Visual/perceptual remediation/compensation    PT Next Visit Plan  balance and stability and ambulation, anterior weight shift hold    PT Home Exercise Plan  TrA contraction     Consulted and Agree with Plan of Care  Patient       Patient will benefit from skilled therapeutic intervention in order to improve the following deficits and impairments:  Abnormal gait, Decreased activity tolerance, Decreased balance, Decreased knowledge of precautions, Decreased endurance, Decreased coordination, Decreased knowledge of use of DME, Decreased mobility, Decreased range of motion, Difficulty walking, Decreased safety awareness, Decreased strength, Impaired flexibility, Impaired perceived functional ability, Impaired tone, Postural dysfunction, Improper body mechanics, Pain  Visit Diagnosis: Muscle weakness (generalized)  Other lack of coordination  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 Acute CVA (cerebrovascular accident) (HCCAlpha1/25/2018  . Left-sided weakness 05/01/2016   MarJanna ArchT, DPT   02/19/2018, 2:44 PM  ConStrykerIN REHHalifax [facility] Center[facility] OrangeRVICES [id number] [place]one: [phone]:  [phone]: BorBORDEN THUNEN: [phone]  of Birth: 1966-04-04

## 2018-02-24 ENCOUNTER — Ambulatory Visit: Payer: Medicare HMO | Admitting: Occupational Therapy

## 2018-02-24 ENCOUNTER — Encounter: Payer: Self-pay | Admitting: Occupational Therapy

## 2018-02-24 ENCOUNTER — Ambulatory Visit: Payer: Medicare HMO

## 2018-02-24 DIAGNOSIS — M6281 Muscle weakness (generalized): Secondary | ICD-10-CM

## 2018-02-24 DIAGNOSIS — R278 Other lack of coordination: Secondary | ICD-10-CM

## 2018-02-24 DIAGNOSIS — I69351 Hemiplegia and hemiparesis following cerebral infarction affecting right dominant side: Secondary | ICD-10-CM

## 2018-02-24 DIAGNOSIS — R2689 Other abnormalities of gait and mobility: Secondary | ICD-10-CM

## 2018-02-24 NOTE — Therapy (Addendum)
[place] MAIN [facility] SERVICES [address] [place], [place], [address] Phone: [phone]   Fax:  [phone]  Physical Therapy Treatment  Patient Details  Name: Curtis Powell MRN: [id number] Date of Birth: 1965-07-20 No data recorded  Encounter Date: 02/24/2018  PT End of Session - 02/24/18 1350    Visit Number  82    Number of Visits  57    Date for PT Re-Evaluation  04/09/18    Authorization Type  PN 3/10 starts 11/7)    PT Start Time  1301    PT Stop Time  1345    PT Time Calculation (min)  44 min    Equipment Utilized During Treatment  Gait belt    Activity Tolerance  Patient tolerated treatment well    Behavior During Therapy  WFL for tasks assessed/performed       Past Medical History:  Diagnosis Date  . GERD (gastroesophageal reflux disease)   . Hyperlipidemia   . Hypertension   . Obesity   . Stroke [facility])     Past Surgical History:  Procedure Laterality Date  . BRAIN SURGERY      There were no vitals filed for this visit.  Subjective Assessment - 02/24/18 1347    Subjective  Patient states he is doing pretty good. States he has been doing his exercises. Reports no falls or LOB since last visit. States he has mild burning pain in L ankle/foot that started randomly today.    Pertinent History   Patient is a pleasant 52 year old male who presents to physical therapy for weakness and immobility secondary to CVA.  Had a stroke in Feb 2018. He was previously fully independent, but this stroke caused severe residual deficits, mainly on the right side as well as speech, and now he is unable to walk or perform most of his ADLs on his own. Entire right side is very weak. He still has a little difficulty with speech but his swallowing is improved to baseline. His mom had to move in with him and now is his main caregiver.     Limitations  Sitting;Lifting;Standing;Walking;House hold activities    How long can you sit comfortably?  5  minutes    How long can you stand comfortably?  5 minutes    How long can you walk comfortably?  10 ft    Patient Stated Goals  Walk without walker, walk further with walker, get some strength in back     Currently in Pain?  Yes    Pain Score  1     Pain Location  Ankle    Pain Orientation  Left    Pain Descriptors / Indicators  Burning          Nustep Lvl 5 4 minutes RPM> 60 seconds.   Transfers: SPT with RW and CGA x2 trials   // bars:      Standing step back 10x each LE. CGA; BUE support ; cues for hand placement to promote upright posture. Verbal cues for large step  Standing forward steps landing with heel. x10 each LE. Verbal cues for hand placement and upright posture. CGA BUE support  Standing step overs RTB. Verbal cues to bring knee towards ceiling and landing with heel. x8 each LE BUE CGA  Standing marches x10 each LE. Verbal cues for upright posture and bringing knee towards ceiling. BUE support. CGA   Seated:  Seated marches x15 each LE. Verbal cues to increase speed and height of march               Seated LAQ x 15 each LE. Verbal cues to increase speed of movement for carryover to gait mechanics and velocity. reciprocal movement  Performed bathroom mobility in middle of session addressing standing balance and mobility                     PT Education - 02/24/18 1349    Education provided  Yes    Education Details  exercise technique, stability     Person(s) Educated  Patient    Methods  Explanation;Demonstration;Verbal cues    Comprehension  Verbalized understanding;Returned demonstration       PT Short Term Goals - 02/12/18 1422      PT SHORT TERM GOAL #1   Title  Patient will be independent in home exercise program to improve strength/mobility for better functional independence with ADLs.    Baseline  hep compliant    Time  2    Period  Weeks    Status  Achieved      PT SHORT TERM GOAL #2   Title  Patient will  require min cueing for STS transfer with CGA for increased independence with mobility.     Baseline  CGA    Time  2    Period  Weeks    Status  Achieved      PT SHORT TERM GOAL #3   Title  Patient will maintain upright posture for > 15 seconds to demonstrate strengthened postural control muscualture     Baseline  18 seconds     Time  2    Period  Weeks    Status  Achieved        PT Long Term Goals - 02/12/18 1337      PT LONG TERM GOAL #1   Title  Patient will perform bed mobility with Supervision to increase independent mobility    Baseline  able to perform mod I for increased time , occasional Min A for scooting up in bed; 7/16: min guard for safety with increased effort and time, cues to scoot back in bed before sit>supine 9/12: can do it independently but with difficulty    Time  8    Period  Weeks    Status  Achieved      PT LONG TERM GOAL #2   Title  Patient (< 59 years old) will complete five times sit to stand test in < 10 seconds indicating an increased LE strength and improved balance.    Baseline  2/18: 30 seconds from plinth chair height single UE on seat and single on walker ; 32 seconds from plinth with single UE on seat and single on walker. 4/10: 26 seconds from [facility] with one hand on walker5/29: 22.2 seconds with one hand on walker one hand on WC; 7/16: one hand on walker one hand on WC 18.96 seconds; pt achieving 75% of full upright standing 8/22:one hand on walker one hand on WC 18 seconds; pt achieving 75% of full upright standing  9/12: 18 seconds econds; pt achieving 75% of full upright standing 8/22:one hand on walker one hand on WC 10/15: 17seconds; pt achieving 75% of full upright posture 11/7: 16 seconds; pt achieving 75 upright posture    Time  8    Period  Weeks    Status  Partially Met  Target Date  04/09/18      PT LONG TERM GOAL #3   Title  Patient will ambulate 40 ft with walker with no breaks to allow for increased mobility within home.     Baseline   12/26: 29 ft with walker and WC follow with no rest breaks; 2/18: multiple trials previous session of >100 ft     Time  8    Period  Weeks    Status  Achieved      PT LONG TERM GOAL #4   Title  Patient will increase BLE gross strength to 4+/5 as to improve functional strength for independent gait, increased standing tolerance and increased ADL ability.    Baseline   4-/5 RLE, 10/15: 4/5 RLE 11/7: 4/5 RLE    Time  8    Period  Weeks    Status  Partially Met    Target Date  04/09/18      PT LONG TERM GOAL #5   Title  Patient will increase lower extremity functional scale to >40/80 to demonstrate improved functional mobility and increased tolerance with ADLs.     Baseline  14/80; 12/26: 21/80; 2/18: 28/80  4/10; 25/80 5/29: 27/80; 7/16: 29/80 8/22: 30/80  9/12: 26/80 (feeling more tired due to moving) 10/15: 37/80 11/7: 23/80    Time  8    Period  Weeks    Status  Partially Met    Target Date  04/09/18      Additional Long Term Goals   Additional Long Term Goals  Yes      PT LONG TERM GOAL #6   Title  Patient will walk through a door involving one small step up/down to increase functional independence.     Baseline  require CGA  BUE support in // bars at this time 10/15: required BUE and CGA in //bars    Time  8    Period  Weeks    Status  Deferred      PT LONG TERM GOAL #7   Title  Patient will ambulate 40 ft with least assistive device and Supervision with no breaks to allow for increased mobility within home.     Baseline  requires use of walker and CGA     Time  8    Period  Weeks    Status  Achieved      PT LONG TERM GOAL #8   Title  Patient will perform 10 MWT in < 1 minute to demonstrate improved ambulatory velocity and capacity.     Baseline  4/10: 2 minutes 45 seconds; 5/29: 2 minute 31 seconds; 3 minutes 22 seconds 8/22: 3 minutes 2 seconds  9/12:  2 minutes and 43 seconds 10/15: 68mnute 12 seconds 11/7: 1 minute 21 seconds    Time  8    Period  Weeks    Status   Partially Met    Target Date  04/09/18      PT LONG TERM GOAL  #9   TITLE  Patient will increase Berg Balance score by > 6 points (26/56) to demonstrate decreased fall risk during functional activities.    Baseline  9/12: 20/56 10/15: 21/56 11/7: 20/56    Time  8    Period  Weeks    Status  Partially Met    Target Date  04/09/18      PT LONG TERM GOAL  #10   TITLE  Patient (< 634years old) will complete five times sit to  stand test with full upright posture in < 30 seconds indicating an increased glute activation, LE strength and improved balance.    Baseline  10/15: 37seconds 11/7: 20 seconds    Time  8    Period  Weeks    Status  Achieved      PT LONG TERM GOAL  #11   TITLE  Patient will ambulate 100 ft with least assistive device and Supervision with no breaks to allow for increased mobility within home.     Baseline  10/17: 62f 11/7: 1080f   Time  8    Period  Weeks    Status  Achieved      PT LONG TERM GOAL  #12   TITLE  Patient (< [age]ears old) will complete five times sit to stand test with full upright posture in < 15 seconds indicating increased glute activation, LE strength and improved balance.    Baseline  11/7: 20 seconds    Time  8    Period  Weeks    Status  New    Target Date  04/09/18      PT LONG TERM GOAL  #13   TITLE  Patient will ambulate 150 ft with least assistive device and Supervision with no breaks to allow for increased mobility within home.     Baseline  11/7: 10049f  Time  8    Period  Weeks    Status  New    Target Date  04/09/18            Plan - 02/24/18 1448    Clinical Impression Statement  Patient tolerated session well with the ability to demonstrate improved upright posture for longer lengths of time. Completed pre gait exercises addressing both step height and heel contact in isolation for improved motor pattern and gait mechanics. Combined both exercises with standing step overs for improved execution and motor pattern with  more complex task. Patient demonstrated improved foot clearance during task following marching exercise. Patient will continue to benefit from skilled physical therapy to improve strength, balance, and functional mobility.     Clinical Impairments Affecting Rehab Potential  hx of HTN, HLD, CVA, learning disability, Diabetes, brain tumor,     PT Frequency  2x / week    PT Duration  8 weeks    PT Treatment/Interventions  ADLs/Self Care Home Management;Aquatic Therapy;Ultrasound;Moist Heat;Traction;DME Instruction;Gait training;Stair training;Functional mobility training;Therapeutic activities;Therapeutic exercise;Orthotic Fit/Training;Neuromuscular re-education;Balance training;Patient/family education;Manual techniques;Wheelchair mobility training;Passive range of motion;Energy conservation;Taping;Visual/perceptual remediation/compensation    PT Next Visit Plan  balance and stability and ambulation, anterior weight shift hold    PT Home Exercise Plan  TrA contraction     Consulted and Agree with Plan of Care  Patient       Patient will benefit from skilled therapeutic intervention in order to improve the following deficits and impairments:  Abnormal gait, Decreased activity tolerance, Decreased balance, Decreased knowledge of precautions, Decreased endurance, Decreased coordination, Decreased knowledge of use of DME, Decreased mobility, Decreased range of motion, Difficulty walking, Decreased safety awareness, Decreased strength, Impaired flexibility, Impaired perceived functional ability, Impaired tone, Postural dysfunction, Improper body mechanics, Pain  Visit Diagnosis: Muscle weakness (generalized)  Other lack of coordination  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 [place]  .  Left-sided weakness  05/01/2016   Erick Blinks, SPT  This entire session was performed under direct supervision and direction of a licensed therapist/therapist assistant . I have personally read, edited and approve of the note as written.  Janna Arch, PT, DPT   02/24/2018, 2:51 PM  [place] MAIN [facility] SERVICES [address] [place], [place], [address] Phone: [phone]   Fax:  [phone]  Name: Curtis Powell MRN: [id number] Date of Birth: 08/13/1965

## 2018-02-24 NOTE — Therapy (Addendum)
[place] MAIN [facility] SERVICES [address] [place], [place], [address] Phone: [phone]   Fax:  [phone]  Occupational Therapy Treatment / 10th Visit Note  Patient Details  Name: Curtis Powell MRN: [id number] Date of Birth: [birth year] No data recorded  Encounter Date: 02/24/2018  OT End of Session - 02/24/18 1441    Visit Number  4    Number of Visits  68    Date for OT Re-Evaluation  04/08/18    Authorization Type  Visit 10 of 10 for progress reporting period starting 01/20/2018    OT Start Time  1345    OT Stop Time  1430    OT Time Calculation (min)  45 min    Activity Tolerance  Patient tolerated treatment well    Behavior During Therapy  [facility] for tasks assessed/performed       Past Medical History:  Diagnosis Date  . GERD (gastroesophageal reflux disease)   . Hyperlipidemia   . Hypertension   . Obesity   . Stroke [facility])     Past Surgical History:  Procedure Laterality Date  . BRAIN SURGERY      There were no vitals filed for this visit.  Subjective Assessment - 02/24/18 1440    Subjective   Pt reports he is doing well today.    Pertinent History  Pt. is a 52 y.o. male who suffered a CVA on 05/01/2016. Pt. was admitted to the hospital. Once discharged, he received Home Health PT and OT services for about a month. Pt. has had multiple CVAs over the past 8 years, and has had multiple falls in the past 6 months. Pt. resides in an apartment. Pt. has caregivers for 80 hours. Pt.'s mother stays with pt. at night, and assists with IADL tasks.      Patient Stated Goals  To be able to throw a ball and dribble a ball.    Currently in Pain?  No/denies    Pain Score  0-No pain         [facility] OT Assessment - 02/24/18 0001      Coordination   Right 9 Hole Peg Test  41    Left 9 Hole Peg Test  39      Hand Function   Right Hand Grip (lbs)  47    Right Hand Lateral Pinch  20 lbs    Right Hand 3 Point Pinch  14 lbs    Left  Hand Grip (lbs)  60    Left Hand Lateral Pinch  16 lbs    Left 3 point pinch  14 lbs      Measurements for bilateral pinch/grip and coordination were obtained and goals were reviewed and updated.   OT TREATMENT  Self-care: Pt stored dishes of various sizes and weights into above head cabinetry while standing while using a front wheeled walker. Pt required min A for balance as pt demonstrated decreased balance when reaching above head. Pt required x3 rest breaks during the task. Pt was unable to reach into the cabinet with the right hand due to ROM limitations.               OT Education - 02/24/18 1441    Education provided  Yes    Education Details  POC, goals, functional reaching    Person(s) Educated  Patient    Methods  Explanation;Demonstration    Comprehension  Verbalized understanding;Returned demonstration;Verbal cues required  OT Short Term Goals - 03/03/17 1459      OT SHORT TERM GOAL #1   Title  `        OT Long Term Goals - 02/24/18 1445      OT LONG TERM GOAL #2   Title  Pt. will complete self grooming with minA    Baseline  02/24/2018 - Pt is independent with oral care and washing his face and occasionally requires increased time if he is having a bad day. Pt's shaving is completed by his barber.    Time  12    Period  Weeks    Status  On-going    Target Date  05/19/18      OT LONG TERM GOAL #4   Title  Pt. will perform self dressing with minA and A/E as needed.    Baseline  02/24/2018 - Pt. continues to improve LB dressing techniques while seated. Pt required extensive time and mod A to complete LB dressing. Pt. requires min A for UB dressing.    Time  12    Period  Weeks    Status  On-going    Target Date  05/19/18      OT LONG TERM GOAL #6   Title  Pt. will write his name efficiently with 100% legibility    Baseline  02/24/2018 - Pt to continue to work to increase legibility and efficiency when writing name to sign in when riding the  [place]    Time  8    Period  Weeks    Status  On-going    Target Date  04/21/18      OT LONG TERM GOAL #7   Title  Pt will independently and consistently follow HEP to increase UE strength to increase functional independence    Baseline  02/24/2018 - Pt to continue to work towards independence with exercises    Time  6    Period  Weeks    Status  On-going    Target Date  04/07/18            Plan - 02/24/18 1442    Clinical Impression Statement  Measurements were obtained and goals were reviewed. Pt has made progress with BUE strength and coordination. Pt continues to work on functional reaching and BUE ROM to increase independence during ADLs and IADLs such as reaching into high cabinetry, unloading dishwasher, and raising arms during UE bathing. Pt presents with RUE AROM deficits and exerts increased effort when reaching above head. Pt worked on reaching into high cabinetry today and required frequent rest breaks when doing so. Pt to continue to work on functional reaching skills and increasing RUE strength.    Occupational performance deficits (Please refer to evaluation for details):  ADL's;IADL's    Rehab Potential  Fair    OT Frequency  2x / week    OT Duration  12 weeks    OT Treatment/Interventions  Self-care/ADL training;Patient/family education;DME and/or AE instruction;Therapeutic exercise;Moist Heat;Neuromuscular education    Clinical Decision Making  Several treatment options, min-mod task modification necessary    Consulted and Agree with Plan of Care  Patient       Patient will benefit from skilled therapeutic intervention in order to improve the following deficits and impairments:  Pain, Decreased cognition, Decreased activity tolerance, Impaired UE functional use, Decreased range of motion, Decreased coordination, Decreased endurance  Visit Diagnosis: Muscle weakness (generalized)    Problem List Patient Active Problem List   Diagnosis Date Noted  .  Acute CVA  (cerebrovascular accident) ([place]) 05/02/2016  . Left-sided weakness 05/01/2016    Oliver Hum, OTS 02/24/2018, 2:51 PM  This entire session was performed under direct supervision and direction of a licensed therapist/therapist assistant . I have personally read, edited and approve of the note as written.  Harrel Carina, MS, OTR/L  [place] MAIN [facility] SERVICES [address] [place], [place], [address] Phone: [phone]   Fax:  [phone]  Name: Curtis Powell MRN: [id number] Date of Birth: 1965-10-27

## 2018-02-26 ENCOUNTER — Ambulatory Visit: Payer: Medicare HMO

## 2018-02-26 ENCOUNTER — Ambulatory Visit: Payer: Medicare HMO | Admitting: Occupational Therapy

## 2018-02-26 ENCOUNTER — Encounter: Payer: Self-pay | Admitting: Occupational Therapy

## 2018-02-26 DIAGNOSIS — I69351 Hemiplegia and hemiparesis following cerebral infarction affecting right dominant side: Secondary | ICD-10-CM

## 2018-02-26 DIAGNOSIS — R278 Other lack of coordination: Secondary | ICD-10-CM

## 2018-02-26 DIAGNOSIS — M6281 Muscle weakness (generalized): Secondary | ICD-10-CM

## 2018-02-26 DIAGNOSIS — R2689 Other abnormalities of gait and mobility: Secondary | ICD-10-CM

## 2018-02-26 NOTE — Therapy (Addendum)
[place] MAIN [facility] SERVICES [address] [place], [place], [address] Phone: [phone]   Fax:  [phone]  Occupational Therapy Treatment  Curtis Powell Details  Name: Curtis Powell MRN: [id number] Date of Birth: 15-Jul-1965 No data recorded  Encounter Date: 02/26/2018  OT End of Session - 02/26/18 1352    Visit Number  51    Number of Visits  40    Date for OT Re-Evaluation  04/08/18    Authorization Type  Visit 1 of 10 for progress reporting period starting 02/26/2018    OT Start Time  1345    OT Stop Time  1430    OT Time Calculation (min)  45 min    Activity Tolerance  Curtis Powell tolerated treatment well    Behavior During Therapy  [facility] for tasks assessed/performed       Past Medical History:  Diagnosis Date  . GERD (gastroesophageal reflux disease)   . Hyperlipidemia   . Hypertension   . Obesity   . Stroke [facility])     Past Surgical History:  Procedure Laterality Date  . BRAIN SURGERY      There were no vitals filed for this visit.  Subjective Assessment - 02/26/18 1349    Subjective   Pt reports he is doing well today and that he is looking forward to seeing family over the coming holidays.    Pertinent History  Pt. is a 52 y.o. male who suffered a CVA on 05/01/2016. Pt. was admitted to the hospital. Once discharged, he received Home Health PT and OT services for about a month. Pt. has had multiple CVAs over the past 8 years, and has had multiple falls in the past 6 months. Pt. resides in an apartment. Pt. has caregivers for 80 hours. Pt.'s mother stays with pt. at night, and assists with IADL tasks.      Curtis Powell Stated Goals  To be able to throw a ball and dribble a ball.    Currently in Pain?  No/denies      OT TREATMENT  Self-care:   Pt. worked on Media planner and speed. Pt. was able to use his right hand to write his name x5 in print form and x5 in cursive form. Pt requires increased time during writing and completes  tasks slowly and deliberately. Pt demonstrates decreased legibility as the task progressed. Pt demonstrates ~50% legibility when writing in print and ~25% legibility when writing in cursive. Pt completed writing task that required him to copy from a writing sample. Pt has difficulties staying oriented on the page when looking from the writing sample to his paper. Pt was educated on strategies to use to stay oriented on the page. Pt was able to increase writing speed and decrease omissions when using strategies. Pt demonstrates poor line spacing and deviates from the left margin. Pt. used a mature hand grasp on the pen. Pt. was able to hold the pen for the entire exercise. Pt. Performed toileting skills with CGA to stand at the toilet for urination. Pt. required minA to hike his pants, and underwear completely over the back of his hips. Pt. Required set-up for hand hygiene following toileting, as well as assist to reach the paper towel.                 OT Education - 02/26/18 1351    Education provided  Yes    Education Details  Handwriting, dressing techniques    Person(s) Educated  Curtis Powell  Methods  Explanation;Demonstration    Comprehension  Verbalized understanding;Returned demonstration;Verbal cues required       OT Short Term Goals - 03/03/17 1459      OT SHORT TERM GOAL #1   Title  `        OT Long Term Goals - 02/24/18 1445      OT LONG TERM GOAL #2   Title  Pt. will complete self grooming with minA    Baseline  02/24/2018 - Pt is independent with oral care and washing his face and occasionally requires increased time if he is having a bad day. Pt's shaving is completed by his barber.    Time  12    Period  Weeks    Status  On-going    Target Date  05/19/18      OT LONG TERM GOAL #4   Title  Pt. will perform self dressing with minA and A/E as needed.    Baseline  02/24/2018 - Pt. continues to improve LB dressing techniques while seated. Pt required extensive  time and mod A to complete LB dressing. Pt. requires min A for UB dressing.    Time  12    Period  Weeks    Status  On-going    Target Date  05/19/18      OT LONG TERM GOAL #6   Title  Pt. will write his name efficiently with 100% legibility    Baseline  02/24/2018 - Pt to continue to work to increase legibility and efficiency when writing name to sign in when riding the [place]    Time  8    Period  Weeks    Status  On-going    Target Date  04/21/18      OT LONG TERM GOAL #7   Title  Pt will independently and consistently follow HEP to increase UE strength to increase functional independence    Baseline  02/24/2018 - Pt to continue to work towards independence with exercises    Time  6    Period  Weeks    Status  On-going    Target Date  04/07/18            Plan - 02/26/18 1352    Clinical Impression Statement  Pt continues to work to improve right hand function in order to be able to write legibly. Pt presents with right hand muscle weakness that compromises his grip on the writing tool and writing endurance. Pt demonstrates decreased legibility when using a small standard writing tool and when having to write for prolonged periods. Pt to continue to increase right hand function and strength in order to promote independence and efficiency when writing and during ADLs and IADLs.    Occupational performance deficits (Please refer to evaluation for details):  ADL's;IADL's    Rehab Potential  Fair    OT Frequency  2x / week    OT Duration  12 weeks    OT Treatment/Interventions  Self-care/ADL training;Curtis Powell/family education;DME and/or AE instruction;Therapeutic exercise;Moist Heat;Neuromuscular education    Clinical Decision Making  Several treatment options, min-mod task modification necessary    Consulted and Agree with Plan of Care  Curtis Powell       Curtis Powell will benefit from skilled therapeutic intervention in order to improve the following deficits and impairments:  Pain,  Decreased cognition, Decreased activity tolerance, Impaired UE functional use, Decreased range of motion, Decreased coordination, Decreased endurance  Visit Diagnosis: Muscle weakness (generalized)  Other lack of coordination  Problem List Curtis Powell Active Problem List   Diagnosis Date Noted  . Acute CVA (cerebrovascular accident) ([place]) 05/02/2016  . Left-sided weakness 05/01/2016    Oliver Hum, OTS 02/26/2018, 2:06 PM   This entire session was performed under direct supervision and direction of a licensed therapist/therapist assistant . I have personally read, edited and approve of the note as written.  Harrel Carina, MS, OTR/L     [place] MAIN [facility] SERVICES [address] [place], [place], [address] Phone: [phone]   Fax:  [phone]  Name: CORDARRYL MONRREAL MRN: [id number] Date of Birth: 08-18-1965

## 2018-02-26 NOTE — Therapy (Addendum)
[place] MAIN [facility] SERVICES [address] [place], [place], [address] Phone: [phone]   Fax:  [phone]  Physical Therapy Treatment  Patient Details  Name: Curtis Powell MRN: [id number] Date of Birth: 06-27-1965 No data recorded  Encounter Date: 02/26/2018  PT End of Session - 02/26/18 1358    Visit Number  34    Number of Visits  99    Date for PT Re-Evaluation  04/09/18    Authorization Type  PN 4/10 starts 11/7)    PT Start Time  1301    PT Stop Time  1345    PT Time Calculation (min)  44 min    Equipment Utilized During Treatment  Gait belt    Activity Tolerance  Patient tolerated treatment well    Behavior During Therapy  WFL for tasks assessed/performed       Past Medical History:  Diagnosis Date  . GERD (gastroesophageal reflux disease)   . Hyperlipidemia   . Hypertension   . Obesity   . Stroke [facility])     Past Surgical History:  Procedure Laterality Date  . BRAIN SURGERY      There were no vitals filed for this visit.  Subjective Assessment - 02/26/18 1301    Subjective  Patient states he is having a good day. No falls or stumbles since last visit. States he has been walking around his house.  Denies burning pain since last visit.     Pertinent History   Patient is a pleasant 52 year old male who presents to physical therapy for weakness and immobility secondary to CVA.  Had a stroke in Feb 2018. He was previously fully independent, but this stroke caused severe residual deficits, mainly on the right side as well as speech, and now he is unable to walk or perform most of his ADLs on his own. Entire right side is very weak. He still has a little difficulty with speech but his swallowing is improved to baseline. His mom had to move in with him and now is his main caregiver.     Limitations  Sitting;Lifting;Standing;Walking;House hold activities    How long can you sit comfortably?  5 minutes    How long can you stand  comfortably?  5 minutes    How long can you walk comfortably?  10 ft    Patient Stated Goals  Walk without walker, walk further with walker, get some strength in back     Currently in Pain?  No/denies           Nustep Lvl 5 4 minutes RPM> 60 seconds.   Transfers: SPT with RW and CGA x2 trials  Ambulate in hallway with RW and CGA  with WC follow, Verbal and manual cues for anterior weight shift prior to each step with glute activation and upright posture.  Verbal cues for heel contact for each step.Tactile cues for gluteal activation to improve step length and RLE stance time. Visual cues to maintain eye contact with 2nd therapist in front of patient.  Patient has limited velocity resulting in prolonged timed needed for distance ambulation.  Patient demonstrated increased velocity as well as improved gait mechanics following first rest break. Two rest breaks total required x14f 1 seated rest break  Standing reaching inside and outside BOS with RW, occasional single to no UE support, to pop bubbles, CGA to min A for stability. 3 min   // bars:     Standing step overs RTB.  Verbal cues to bring knee towards ceiling and landing with heel. x10 each LE BUE CGA. MinA to L foot for heel contact. Patient able to complete independently after facilitation.    Standing marches x10 each LE. Verbal cues for upright posture and bringing knee towards ceiling. BUE support. CGA   Seated:              Seated alternating ball kick (LAQ) s for coordination and reaction time x15 each leg.           .                  PT Education - 02/26/18 1309    Education provided  Yes    Education Details  exercise technique, ambulation, HEP    Person(s) Educated  Patient    Methods  Explanation;Demonstration;Verbal cues    Comprehension  Verbalized understanding;Returned demonstration       PT Short Term Goals - 02/12/18 1422      PT SHORT TERM GOAL #1   Title  Patient will be independent  in home exercise program to improve strength/mobility for better functional independence with ADLs.    Baseline  hep compliant    Time  2    Period  Weeks    Status  Achieved      PT SHORT TERM GOAL #2   Title  Patient will require min cueing for STS transfer with CGA for increased independence with mobility.     Baseline  CGA    Time  2    Period  Weeks    Status  Achieved      PT SHORT TERM GOAL #3   Title  Patient will maintain upright posture for > 15 seconds to demonstrate strengthened postural control muscualture     Baseline  18 seconds     Time  2    Period  Weeks    Status  Achieved        PT Long Term Goals - 02/12/18 1337      PT LONG TERM GOAL #1   Title  Patient will perform bed mobility with Supervision to increase independent mobility    Baseline  able to perform mod I for increased time , occasional Min A for scooting up in bed; 7/16: min guard for safety with increased effort and time, cues to scoot back in bed before sit>supine 9/12: can do it independently but with difficulty    Time  8    Period  Weeks    Status  Achieved      PT LONG TERM GOAL #2   Title  Patient (< 37 years old) will complete five times sit to stand test in < 10 seconds indicating an increased LE strength and improved balance.    Baseline  2/18: 30 seconds from plinth chair height single UE on seat and single on walker ; 32 seconds from plinth with single UE on seat and single on walker. 4/10: 26 seconds from [facility] with one hand on walker5/29: 22.2 seconds with one hand on walker one hand on WC; 7/16: one hand on walker one hand on WC 18.96 seconds; pt achieving 75% of full upright standing 8/22:one hand on walker one hand on WC 18 seconds; pt achieving 75% of full upright standing  9/12: 18 seconds econds; pt achieving 75% of full upright standing 8/22:one hand on walker one hand on WC 10/15: 17seconds; pt achieving 75% of full upright posture 11/7: 16 seconds; pt  achieving 75 upright posture     Time  8    Period  Weeks    Status  Partially Met    Target Date  04/09/18      PT LONG TERM GOAL #3   Title  Patient will ambulate 40 ft with walker with no breaks to allow for increased mobility within home.     Baseline  12/26: 29 ft with walker and WC follow with no rest breaks; 2/18: multiple trials previous session of >100 ft     Time  8    Period  Weeks    Status  Achieved      PT LONG TERM GOAL #4   Title  Patient will increase BLE gross strength to 4+/5 as to improve functional strength for independent gait, increased standing tolerance and increased ADL ability.    Baseline   4-/5 RLE, 10/15: 4/5 RLE 11/7: 4/5 RLE    Time  8    Period  Weeks    Status  Partially Met    Target Date  04/09/18      PT LONG TERM GOAL #5   Title  Patient will increase lower extremity functional scale to >40/80 to demonstrate improved functional mobility and increased tolerance with ADLs.     Baseline  14/80; 12/26: 21/80; 2/18: 28/80  4/10; 25/80 5/29: 27/80; 7/16: 29/80 8/22: 30/80  9/12: 26/80 (feeling more tired due to moving) 10/15: 37/80 11/7: 23/80    Time  8    Period  Weeks    Status  Partially Met    Target Date  04/09/18      Additional Long Term Goals   Additional Long Term Goals  Yes      PT LONG TERM GOAL #6   Title  Patient will walk through a door involving one small step up/down to increase functional independence.     Baseline  require CGA  BUE support in // bars at this time 10/15: required BUE and CGA in //bars    Time  8    Period  Weeks    Status  Deferred      PT LONG TERM GOAL #7   Title  Patient will ambulate 40 ft with least assistive device and Supervision with no breaks to allow for increased mobility within home.     Baseline  requires use of walker and CGA     Time  8    Period  Weeks    Status  Achieved      PT LONG TERM GOAL #8   Title  Patient will perform 10 MWT in < 1 minute to demonstrate improved ambulatory velocity and capacity.     Baseline   4/10: 2 minutes 45 seconds; 5/29: 2 minute 31 seconds; 3 minutes 22 seconds 8/22: 3 minutes 2 seconds  9/12:  2 minutes and 43 seconds 10/15: 73mnute 12 seconds 11/7: 1 minute 21 seconds    Time  8    Period  Weeks    Status  Partially Met    Target Date  04/09/18      PT LONG TERM GOAL  #9   TITLE  Patient will increase Berg Balance score by > 6 points (26/56) to demonstrate decreased fall risk during functional activities.    Baseline  9/12: 20/56 10/15: 21/56 11/7: 20/56    Time  8    Period  Weeks    Status  Partially Met    Target Date  04/09/18  PT LONG TERM GOAL  #10   TITLE  Patient (< 56 years old) will complete five times sit to stand test with full upright posture in < 30 seconds indicating an increased glute activation, LE strength and improved balance.    Baseline  10/15: 37seconds 11/7: 20 seconds    Time  8    Period  Weeks    Status  Achieved      PT LONG TERM GOAL  #11   TITLE  Patient will ambulate 100 ft with least assistive device and Supervision with no breaks to allow for increased mobility within home.     Baseline  10/17: 93f 11/7: 1069f   Time  8    Period  Weeks    Status  Achieved      PT LONG TERM GOAL  #12   TITLE  Patient (< [age]ears old) will complete five times sit to stand test with full upright posture in < 15 seconds indicating increased glute activation, LE strength and improved balance.    Baseline  11/7: 20 seconds    Time  8    Period  Weeks    Status  New    Target Date  04/09/18      PT LONG TERM GOAL  #13   TITLE  Patient will ambulate 150 ft with least assistive device and Supervision with no breaks to allow for increased mobility within home.     Baseline  11/7: 10064f  Time  8    Period  Weeks    Status  New    Target Date  04/09/18            Plan - 02/26/18 1356    Clinical Impression Statement  Session focused on pre gait activities prior to ambulation. Patient responded extremely well to tactile cueing and was  able to complete heel contact with no assistance during pre gait exercise following tactile facilitaion. Patient demonstrated improved gait mechanics and velocity following seated rest break during ambulation. Patient had improved step length and heel contact with facilitation and verbal cueing.  Patient will continue to benefit from skilled physical therapy to improve mobility, strength, balance, and gait mechanics    Clinical Impairments Affecting Rehab Potential  hx of HTN, HLD, CVA, learning disability, Diabetes, brain tumor,     PT Frequency  2x / week    PT Duration  8 weeks    PT Treatment/Interventions  ADLs/Self Care Home Management;Aquatic Therapy;Ultrasound;Moist Heat;Traction;DME Instruction;Gait training;Stair training;Functional mobility training;Therapeutic activities;Therapeutic exercise;Orthotic Fit/Training;Neuromuscular re-education;Balance training;Patient/family education;Manual techniques;Wheelchair mobility training;Passive range of motion;Energy conservation;Taping;Visual/perceptual remediation/compensation    PT Next Visit Plan  balance and stability and ambulation, anterior weight shift hold    PT Home Exercise Plan  TrA contraction     Consulted and Agree with Plan of Care  Patient       Patient will benefit from skilled therapeutic intervention in order to improve the following deficits and impairments:  Abnormal gait, Decreased activity tolerance, Decreased balance, Decreased knowledge of precautions, Decreased endurance, Decreased coordination, Decreased knowledge of use of DME, Decreased mobility, Decreased range of motion, Difficulty walking, Decreased safety awareness, Decreased strength, Impaired flexibility, Impaired perceived functional ability, Impaired tone, Postural dysfunction, Improper body mechanics, Pain  Visit Diagnosis: Muscle weakness (generalized)  Other lack of coordination  Hemiplegia and hemiparesis following cerebral infarction affecting right  dominant side (HCC)  Other abnormalities of gait and mobility     Problem List Patient Active Problem List  Diagnosis Date Noted  . Acute CVA (cerebrovascular accident) ([place]) 05/02/2016  . Left-sided weakness 05/01/2016   Erick Blinks, SPT  This entire session was performed under direct supervision and direction of a licensed therapist/therapist assistant . I have personally read, edited and approve of the note as written.  Janna Arch, PT, DPT   02/26/2018, 2:09 PM  [place] MAIN [facility] SERVICES [address] [place], [place], [address] Phone: [phone]   Fax:  [phone]  Name: Curtis Powell MRN: [id number] Date of Birth: Aug 12, 1965

## 2018-03-03 ENCOUNTER — Ambulatory Visit: Payer: Medicare HMO | Admitting: Occupational Therapy

## 2018-03-03 ENCOUNTER — Ambulatory Visit: Payer: Medicare HMO

## 2018-03-10 ENCOUNTER — Ambulatory Visit: Payer: Medicare HMO | Attending: Family Medicine | Admitting: Occupational Therapy

## 2018-03-10 ENCOUNTER — Encounter: Payer: Self-pay | Admitting: Physical Therapy

## 2018-03-10 ENCOUNTER — Ambulatory Visit: Payer: Medicare HMO | Admitting: Physical Therapy

## 2018-03-10 ENCOUNTER — Encounter: Payer: Self-pay | Admitting: Occupational Therapy

## 2018-03-10 DIAGNOSIS — R2689 Other abnormalities of gait and mobility: Secondary | ICD-10-CM

## 2018-03-10 DIAGNOSIS — M6281 Muscle weakness (generalized): Secondary | ICD-10-CM | POA: Insufficient documentation

## 2018-03-10 DIAGNOSIS — I69351 Hemiplegia and hemiparesis following cerebral infarction affecting right dominant side: Secondary | ICD-10-CM

## 2018-03-10 DIAGNOSIS — R278 Other lack of coordination: Secondary | ICD-10-CM

## 2018-03-10 NOTE — Therapy (Signed)
[place] MAIN [facility] SERVICES [address] [place], [place], [address] Phone: [phone]   Fax:  [phone]  Occupational Therapy Treatment  Patient Details  Name: Curtis Powell MRN: [id number] Date of Birth: 1965-09-15 No data recorded  Encounter Date: 03/10/2018  OT End of Session - 03/10/18 1311    Visit Number  68    Number of Visits  69    Date for OT Re-Evaluation  04/08/18    Authorization Type  Visit 2 of 10 for progress reporting period starting 02/26/2018    OT Start Time  1301    OT Stop Time  1355    OT Time Calculation (min)  54 min    Activity Tolerance  Patient tolerated treatment well    Behavior During Therapy  [facility] for tasks assessed/performed       Past Medical History:  Diagnosis Date  . GERD (gastroesophageal reflux disease)   . Hyperlipidemia   . Hypertension   . Obesity   . Stroke [facility])     Past Surgical History:  Procedure Laterality Date  . BRAIN SURGERY      There were no vitals filed for this visit.  Subjective Assessment - 03/10/18 1308    Subjective   Patient reports he had a good Thanksgiving.  He was early today secondary to transportation so we moved him into earlier slots.  He went to the cafeteria at lunch, he required help with getting his food and tray.     Pertinent History  Pt. is a 52 y.o. male who suffered a CVA on 05/01/2016. Pt. was admitted to the hospital. Once discharged, he received Home Health PT and OT services for about a month. Pt. has had multiple CVAs over the past 8 years, and has had multiple falls in the past 6 months. Pt. resides in an apartment. Pt. has caregivers for 80 hours. Pt.'s mother stays with pt. at night, and assists with IADL tasks.      Patient Stated Goals  To be able to throw a ball and dribble a ball, do as much as I can for myself.     Currently in Pain?  No/denies    Pain Score  0-No pain          Patient seen for reaching tasks from wheelchair level  with shape tower, completing 3 levels of reach with right hand and then left hand.  Cues for weight shifting  And extending reach to get to 3rd level.  Unable to reach 4th level from seated position.    Patient manipulating 100 pegboard pieces to pick up from a bucket, turn to white dotted side up and place into board, cues occasionally for prehension patterns and to work Towards improved speed and dexterity.   Self Care Handwriting for name, print and script for several trials, address however requires having it written first and then able to copy.  Toileting with use of grab bars, min assist for clothing negotiation after toileting to pull pants over hips and readjust clothing.  Handwashing from seated position, Assist to access soap from dispenser.                  OT Education - 03/10/18 1311    Education provided  Yes    Education Details  reaching    [name]) Educated  Patient    Methods  Explanation;Demonstration    Comprehension  Verbalized understanding;Returned demonstration;Verbal cues required  OT Long Term Goals - 02/24/18 1445      OT LONG TERM GOAL #2   Title  Pt. will complete self grooming with minA    Baseline  02/24/2018 - Pt is independent with oral care and washing his face and occasionally requires increased time if he is having a bad day. Pt's shaving is completed by his barber.    Time  12    Period  Weeks    Status  On-going    Target Date  05/19/18      OT LONG TERM GOAL #4   Title  Pt. will perform self dressing with minA and A/E as needed.    Baseline  02/24/2018 - Pt. continues to improve LB dressing techniques while seated. Pt required extensive time and mod A to complete LB dressing. Pt. requires min A for UB dressing.    Time  12    Period  Weeks    Status  On-going    Target Date  05/19/18      OT LONG TERM GOAL #6   Title  Pt. will write his name efficiently with 100% legibility    Baseline  02/24/2018 - Pt to  continue to work to increase legibility and efficiency when writing name to sign in when riding the [place]    Time  8    Period  Weeks    Status  On-going    Target Date  04/21/18      OT LONG TERM GOAL #7   Title  Pt will independently and consistently follow HEP to increase UE strength to increase functional independence    Baseline  02/24/2018 - Pt to continue to work towards independence with exercises    Time  6    Period  Weeks    Status  On-going    Target Date  04/07/18            Plan - 03/10/18 1312    Clinical Impression Statement  Patient continues to require assistance with clothing negotiation with toileting skills, min assist to pull pants over hips and to adjust positioning of clothing.  Handwriting is slowly improving with legibility, greater with print than cursive.  Continuing to focus on strength and coordination skills to assist with self care tasks.       Occupational performance deficits (Please refer to evaluation for details):  ADL's;IADL's    Rehab Potential  Fair    OT Frequency  2x / week    OT Duration  12 weeks    OT Treatment/Interventions  Self-care/ADL training;Patient/family education;DME and/or AE instruction;Therapeutic exercise;Moist Heat;Neuromuscular education    Consulted and Agree with Plan of Care  Patient       Patient will benefit from skilled therapeutic intervention in order to improve the following deficits and impairments:  Pain, Decreased cognition, Decreased activity tolerance, Impaired UE functional use, Decreased range of motion, Decreased coordination, Decreased endurance  Visit Diagnosis: Muscle weakness (generalized)  Other lack of coordination  Hemiplegia and hemiparesis following cerebral infarction affecting right dominant side [facility])    Problem List Patient Active Problem List   Diagnosis Date Noted  . Acute CVA (cerebrovascular accident) ([place]) 05/02/2016  . Left-sided weakness 05/01/2016   Tajuanna Burnett T [name], OTR/L,  CLT  Verdell Kincannon 03/10/2018, 8:42 PM  [place] MAIN [facility] SERVICES [address] [place], [place], [address] Phone: [phone]   Fax:  [phone]  Name: Curtis Powell MRN: [id number] Date of Birth: 07/23/1965

## 2018-03-10 NOTE — Therapy (Signed)
[place] MAIN [facility] SERVICES [address] [name], [place], [address] Phone: [phone]   Fax:  [phone]  Physical Therapy Treatment  Patient Details  Name: Curtis Powell MRN: [id number] Date of Birth: 1965/06/12 No data recorded  Encounter Date: 03/10/2018  PT End of Session - 03/10/18 1129    Visit Number  40    Number of Visits  33    Date for PT Re-Evaluation  04/09/18    Authorization Type  PN 5/10 starts 11/7)    PT Start Time  1122    PT Stop Time  1205    PT Time Calculation (min)  43 min    Equipment Utilized During Treatment  Gait belt    Activity Tolerance  Patient tolerated treatment well    Behavior During Therapy  WFL for tasks assessed/performed       Past Medical History:  Diagnosis Date  . GERD (gastroesophageal reflux disease)   . Hyperlipidemia   . Hypertension   . Obesity   . Stroke [facility])     Past Surgical History:  Procedure Laterality Date  . BRAIN SURGERY      There were no vitals filed for this visit.  Subjective Assessment - 03/10/18 1128    Subjective  Patient reports doing well; denies any new changes; denies any new falls; Reports adherence to HEP    Pertinent History   Patient is a pleasant 52 year old male who presents to physical therapy for weakness and immobility secondary to CVA.  Had a stroke in Feb 2018. He was previously fully independent, but this stroke caused severe residual deficits, mainly on the right side as well as speech, and now he is unable to walk or perform most of his ADLs on his own. Entire right side is very weak. He still has a little difficulty with speech but his swallowing is improved to baseline. His mom had to move in with him and now is his main caregiver.     Limitations  Sitting;Lifting;Standing;Walking;House hold activities    How long can you sit comfortably?  5 minutes    How long can you stand comfortably?  5 minutes    How long can you walk comfortably?  10  ft    Patient Stated Goals  Walk without walker, walk further with walker, get some strength in back     Currently in Pain?  No/denies    Multiple Pain Sites  No          NustepLvl 5 4 minutes RPM> 60 seconds  Transfers: sit<>Stand with RW, CGA  Gait in gym: x20 feet with RW with 1 turn to parallel bars, min A with mod VCs for erect posture, increase hip extension with improved gluteal activation; patient has increased difficulty with turning due to weakness in hip abductors/ER requiring min A and mod VCs for hand placement and foot placement, requiring increased time;  Ambulatein hallwaywith RWand CGA with WC follow,Verbal and manual cues foranteriorweight shiftprior to each step with glute activation and upright posture.  Verbal cues for heel contact for each step.Tactile cues for gluteal activation to improve step lengthand RLE stance time. Patient has limited velocity resulting in prolonged timed needed for distance ambulation.  Patient demonstrated increased velocity as well as improved gait mechanics following first rest break, however continues to have heavy foot drag with increased duration due to fatigue and weakness. 1 rest breaks total requiredx43f    // bars:   Standing  step overs orange hurdle. Verbal cues to bring knee towards ceiling and landing with heel. x5 each LE BUE CGA. Patient required mod VCs to improve erect posture and hip extension in stance leg for better stride length and foot clearance;  Standing marches x10 each LE. Verbal cues for upright posture and bringing knee towards ceiling. BUE support. CGA  Standing in mid stance/staggered stance position, with B rail assist, dynamic reversals with therapist manual resistance for pelvic anterior/posterior rotation during weight shift x5 reps each foot in front; had increased difficulty with LLE forward due to weakness and tightness in right hip musculature.    Patient requires several rest breaks  due to fatigue.                      PT Education - 03/10/18 1128    Education provided  Yes    Education Details  exercise technique, ambulation, HEP reinforced    Person(s) Educated  Patient    Methods  Explanation;Demonstration;Verbal cues    Comprehension  Verbalized understanding;Returned demonstration;Verbal cues required;Need further instruction       PT Short Term Goals - 02/12/18 1422      PT SHORT TERM GOAL #1   Title  Patient will be independent in home exercise program to improve strength/mobility for better functional independence with ADLs.    Baseline  hep compliant    Time  2    Period  Weeks    Status  Achieved      PT SHORT TERM GOAL #2   Title  Patient will require min cueing for STS transfer with CGA for increased independence with mobility.     Baseline  CGA    Time  2    Period  Weeks    Status  Achieved      PT SHORT TERM GOAL #3   Title  Patient will maintain upright posture for > 15 seconds to demonstrate strengthened postural control muscualture     Baseline  18 seconds     Time  2    Period  Weeks    Status  Achieved        PT Long Term Goals - 02/12/18 1337      PT LONG TERM GOAL #1   Title  Patient will perform bed mobility with Supervision to increase independent mobility    Baseline  able to perform mod I for increased time , occasional Min A for scooting up in bed; 7/16: min guard for safety with increased effort and time, cues to scoot back in bed before sit>supine 9/12: can do it independently but with difficulty    Time  8    Period  Weeks    Status  Achieved      PT LONG TERM GOAL #2   Title  Patient (< 21 years old) will complete five times sit to stand test in < 10 seconds indicating an increased LE strength and improved balance.    Baseline  2/18: 30 seconds from plinth chair height single UE on seat and single on walker ; 32 seconds from plinth with single UE on seat and single on walker. 4/10: 26 seconds from  [facility] with one hand on walker5/29: 22.2 seconds with one hand on walker one hand on WC; 7/16: one hand on walker one hand on WC 18.96 seconds; pt achieving 75% of full upright standing 8/22:one hand on walker one hand on WC 18 seconds; pt achieving 75% of full upright  standing  9/12: 18 seconds econds; pt achieving 75% of full upright standing 8/22:one hand on walker one hand on WC 10/15: 17seconds; pt achieving 75% of full upright posture 11/7: 16 seconds; pt achieving 75 upright posture    Time  8    Period  Weeks    Status  Partially Met    Target Date  04/09/18      PT LONG TERM GOAL #3   Title  Patient will ambulate 40 ft with walker with no breaks to allow for increased mobility within home.     Baseline  12/26: 29 ft with walker and WC follow with no rest breaks; 2/18: multiple trials previous session of >100 ft     Time  8    Period  Weeks    Status  Achieved      PT LONG TERM GOAL #4   Title  Patient will increase BLE gross strength to 4+/5 as to improve functional strength for independent gait, increased standing tolerance and increased ADL ability.    Baseline   4-/5 RLE, 10/15: 4/5 RLE 11/7: 4/5 RLE    Time  8    Period  Weeks    Status  Partially Met    Target Date  04/09/18      PT LONG TERM GOAL #5   Title  Patient will increase lower extremity functional scale to >40/80 to demonstrate improved functional mobility and increased tolerance with ADLs.     Baseline  14/80; 12/26: 21/80; 2/18: 28/80  4/10; 25/80 5/29: 27/80; 7/16: 29/80 8/22: 30/80  9/12: 26/80 (feeling more tired due to moving) 10/15: 37/80 11/7: 23/80    Time  8    Period  Weeks    Status  Partially Met    Target Date  04/09/18      Additional Long Term Goals   Additional Long Term Goals  Yes      PT LONG TERM GOAL #6   Title  Patient will walk through a door involving one small step up/down to increase functional independence.     Baseline  require CGA  BUE support in // bars at this time 10/15: required  BUE and CGA in //bars    Time  8    Period  Weeks    Status  Deferred      PT LONG TERM GOAL #7   Title  Patient will ambulate 40 ft with least assistive device and Supervision with no breaks to allow for increased mobility within home.     Baseline  requires use of walker and CGA     Time  8    Period  Weeks    Status  Achieved      PT LONG TERM GOAL #8   Title  Patient will perform 10 MWT in < 1 minute to demonstrate improved ambulatory velocity and capacity.     Baseline  4/10: 2 minutes 45 seconds; 5/29: 2 minute 31 seconds; 3 minutes 22 seconds 8/22: 3 minutes 2 seconds  9/12:  2 minutes and 43 seconds 10/15: 47mnute 12 seconds 11/7: 1 minute 21 seconds    Time  8    Period  Weeks    Status  Partially Met    Target Date  04/09/18      PT LONG TERM GOAL  #9   TITLE  Patient will increase Berg Balance score by > 6 points (26/56) to demonstrate decreased fall risk during functional activities.    Baseline  9/12: 20/56 10/15: 21/56 11/7: 20/56    Time  8    Period  Weeks    Status  Partially Met    Target Date  04/09/18      PT LONG TERM GOAL  #10   TITLE  Patient (< 64 years old) will complete five times sit to stand test with full upright posture in < 30 seconds indicating an increased glute activation, LE strength and improved balance.    Baseline  10/15: 37seconds 11/7: 20 seconds    Time  8    Period  Weeks    Status  Achieved      PT LONG TERM GOAL  #11   TITLE  Patient will ambulate 100 ft with least assistive device and Supervision with no breaks to allow for increased mobility within home.     Baseline  10/17: 53f 11/7: 1014f   Time  8    Period  Weeks    Status  Achieved      PT LONG TERM GOAL  #12   TITLE  Patient (< 6028ears old) will complete five times sit to stand test with full upright posture in < 15 seconds indicating increased glute activation, LE strength and improved balance.    Baseline  11/7: 20 seconds    Time  8    Period  Weeks    Status   New    Target Date  04/09/18      PT LONG TERM GOAL  #13   TITLE  Patient will ambulate 150 ft with least assistive device and Supervision with no breaks to allow for increased mobility within home.     Baseline  11/7: 10043f  Time  8    Period  Weeks    Status  New    Target Date  04/09/18            Plan - 03/10/18 1211    Clinical Impression Statement  Patient tolerated session well. Instructed patient in LE strengthening with hip flexion march and stepping over obstacles to improve mobility for gait tasks. Patient ambulates with RW, min A to CGA with forward flexed posture, decreased step length, decreased foot clearance with toe tap at initial contact. Patient able to improve erect posture with anterior pelvic movement and hip extension for short duration but exhibits increased foot drag and foot slap with prolonged ambulation due to fatigue. patient required short sitting rest breaks throughout session due to fatigue. he would benefit from additional skilled PT intervention to improve strength, balance and gait safety for improved functional mobility;     Clinical Impairments Affecting Rehab Potential  hx of HTN, HLD, CVA, learning disability, Diabetes, brain tumor,     PT Frequency  2x / week    PT Duration  8 weeks    PT Treatment/Interventions  ADLs/Self Care Home Management;Aquatic Therapy;Ultrasound;Moist Heat;Traction;DME Instruction;Gait training;Stair training;Functional mobility training;Therapeutic activities;Therapeutic exercise;Orthotic Fit/Training;Neuromuscular re-education;Balance training;Patient/family education;Manual techniques;Wheelchair mobility training;Passive range of motion;Energy conservation;Taping;Visual/perceptual remediation/compensation    PT Next Visit Plan  balance and stability and ambulation, anterior weight shift hold    PT Home Exercise Plan  TrA contraction     Consulted and Agree with Plan of Care  Patient       Patient will benefit from  skilled therapeutic intervention in order to improve the following deficits and impairments:  Abnormal gait, Decreased activity tolerance, Decreased balance, Decreased knowledge of precautions, Decreased endurance, Decreased coordination, Decreased knowledge of use of  DME, Decreased mobility, Decreased range of motion, Difficulty walking, Decreased safety awareness, Decreased strength, Impaired flexibility, Impaired perceived functional ability, Impaired tone, Postural dysfunction, Improper body mechanics, Pain  Visit Diagnosis: Muscle weakness (generalized)  Other lack of coordination  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 Acute CVA (cerebrovascular accident) ([place]) 05/02/2016  . Left-sided weakness 05/01/2016    Trotter,Margaret PT, DPT 03/10/2018, 12:14 PM  [place] MAIN [facility] SERVICES [address] [place], [place], [address] Phone: [phone]   Fax:  [phone]  Name: JANTHONY HOLLEMAN MRN: [id number] Date of Birth: 1965-10-21

## 2018-03-12 ENCOUNTER — Ambulatory Visit: Payer: Medicare HMO | Admitting: Physical Therapy

## 2018-03-12 ENCOUNTER — Encounter: Payer: Self-pay | Admitting: Occupational Therapy

## 2018-03-12 ENCOUNTER — Encounter: Payer: Self-pay | Admitting: Physical Therapy

## 2018-03-12 ENCOUNTER — Ambulatory Visit: Payer: Medicare HMO | Admitting: Occupational Therapy

## 2018-03-12 DIAGNOSIS — R278 Other lack of coordination: Secondary | ICD-10-CM

## 2018-03-12 DIAGNOSIS — M6281 Muscle weakness (generalized): Secondary | ICD-10-CM | POA: Diagnosis not present

## 2018-03-12 DIAGNOSIS — R2689 Other abnormalities of gait and mobility: Secondary | ICD-10-CM

## 2018-03-12 DIAGNOSIS — I69351 Hemiplegia and hemiparesis following cerebral infarction affecting right dominant side: Secondary | ICD-10-CM

## 2018-03-12 NOTE — Patient Instructions (Signed)
Login URL: [email or web address] . Access Code: [email or web address] . Date printed: 03/12/2018

## 2018-03-12 NOTE — Therapy (Signed)
[place] MAIN [facility] SERVICES [address] [place], [place], [address] Phone: [phone]   Fax:  [phone]  Occupational Therapy Treatment  Patient Details  Name: Curtis Powell MRN: [id number] Date of Birth: Aug 12, 1965 No data recorded  Encounter Date: 03/12/2018  OT End of Session - 03/12/18 1319    Visit Number  36    Number of Visits  22    Date for OT Re-Evaluation  04/08/18    Authorization Type  Visit 3 of 10 for progress reporting period starting 02/26/2018    OT Start Time  1300    OT Stop Time  1345    OT Time Calculation (min)  45 min    Activity Tolerance  Patient tolerated treatment well    Behavior During Therapy  [facility] for tasks assessed/performed       Past Medical History:  Diagnosis Date  . GERD (gastroesophageal reflux disease)   . Hyperlipidemia   . Hypertension   . Obesity   . Stroke [facility])     Past Surgical History:  Procedure Laterality Date  . BRAIN SURGERY      There were no vitals filed for this visit.  Subjective Assessment - 03/12/18 1316    Subjective   Pt.'s transportation brough the pt. early today.    Patient Stated Goals  To be able to throw a ball and dribble a ball, do as much as I can for myself.     Currently in Pain?  No/denies      OT TREATMENT    Neuro muscular re-education:  Pt. worked on [facility] grasping 1/2" small magnetic pegs, disconnecting them, and placing them on a positioned at a vertical angle at a tabletop. Pt. worked on removing the pegs while alternating thumb opposition to the tip of the 2nd through 5th digits. Pt. worked on bilateral [facility] skills needed to grasp small resistive beads. Pt. worked on connecting the beads using a 3pt. pinch, and lateral pt. Pinch grasp. Pt. worked on disconnecting the resistive beads using a lateral pinch grasp, and 3pt. pinch grasp.    Therapeutic Exercise:  Pt. performed gross gripping with grip strengthener. Pt. worked on sustaining grip  while grasping pegs and reaching at various heights. The gripper was placed in the resistive slot 3rd with the white resistive spring.                          OT Education - 03/12/18 1317    Education provided  Yes    Education Details  RUE functioning    Person(s) Educated  Patient    Methods  Explanation;Demonstration    Comprehension  Verbalized understanding;Returned demonstration;Verbal cues required       OT Short Term Goals - 03/03/17 1459      OT SHORT TERM GOAL #1   Title  `        OT Long Term Goals - 02/24/18 1445      OT LONG TERM GOAL #2   Title  Pt. will complete self grooming with minA    Baseline  02/24/2018 - Pt is independent with oral care and washing his face and occasionally requires increased time if he is having a bad day. Pt's shaving is completed by his barber.    Time  12    Period  Weeks    Status  On-going    Target Date  05/19/18      OT LONG  TERM GOAL #4   Title  Pt. will perform self dressing with minA and A/E as needed.    Baseline  02/24/2018 - Pt. continues to improve LB dressing techniques while seated. Pt required extensive time and mod A to complete LB dressing. Pt. requires min A for UB dressing.    Time  12    Period  Weeks    Status  On-going    Target Date  05/19/18      OT LONG TERM GOAL #6   Title  Pt. will write his name efficiently with 100% legibility    Baseline  02/24/2018 - Pt to continue to work to increase legibility and efficiency when writing name to sign in when riding the [place]    Time  8    Period  Weeks    Status  On-going    Target Date  04/21/18      OT LONG TERM GOAL #7   Title  Pt will independently and consistently follow HEP to increase UE strength to increase functional independence    Baseline  02/24/2018 - Pt to continue to work towards independence with exercises    Time  6    Period  Weeks    Status  On-going    Target Date  04/07/18            Plan - 03/12/18 1320     Clinical Impression Statement  Pt. is making progress overall.  Pt. is improving UE reach, however has difficult manipulating objects while his UEs are elevated. Pt. presents with limited UE strength, [facility] skills, writing legibility, LE clothing negotiation skills, and continues to work on these skills in order to improve ADL, and IADL independence.    Occupational performance deficits (Please refer to evaluation for details):  ADL's;IADL's    Rehab Potential  Fair    OT Frequency  2x / week    OT Duration  12 weeks    OT Treatment/Interventions  Self-care/ADL training;Patient/family education;DME and/or AE instruction;Therapeutic exercise;Moist Heat;Neuromuscular education    Clinical Decision Making  Several treatment options, min-mod task modification necessary    Consulted and Agree with Plan of Care  Patient       Patient will benefit from skilled therapeutic intervention in order to improve the following deficits and impairments:  Pain, Decreased cognition, Decreased activity tolerance, Impaired UE functional use, Decreased range of motion, Decreased coordination, Decreased endurance  Visit Diagnosis: Muscle weakness (generalized)  Other lack of coordination    Problem List Patient Active Problem List   Diagnosis Date Noted  . Acute CVA (cerebrovascular accident) ([place]) 05/02/2016  . Left-sided weakness 05/01/2016    Harrel Carina, MS, OTR/L 03/12/2018, 1:39 PM  [place] MAIN [facility] SERVICES [address] [place], [place], [address] Phone: [phone]   Fax:  [phone]  Name: Curtis Powell MRN: [id number] Date of Birth: 1966-03-31

## 2018-03-12 NOTE — Therapy (Signed)
[place] MAIN [facility] SERVICES [address] [place], [place], [address] Phone: [phone]   Fax:  [phone]  Physical Therapy Treatment  Patient Details  Name: Curtis Powell MRN: [id number] Date of Birth: 12-05-1965 No data recorded  Encounter Date: 03/12/2018  PT End of Session - 03/12/18 1258    Visit Number  8    Number of Visits  72    Date for PT Re-Evaluation  04/09/18    Authorization Type  PN 6/10 starts 11/7)    PT Start Time  1432    PT Stop Time  1515    PT Time Calculation (min)  43 min    Equipment Utilized During Treatment  Gait belt    Activity Tolerance  Patient tolerated treatment well    Behavior During Therapy  WFL for tasks assessed/performed       Past Medical History:  Diagnosis Date  . GERD (gastroesophageal reflux disease)   . Hyperlipidemia   . Hypertension   . Obesity   . Stroke [facility])     Past Surgical History:  Procedure Laterality Date  . BRAIN SURGERY      There were no vitals filed for this visit.  Subjective Assessment - 03/12/18 1257    Subjective  Patient reports doing well; Denies any new falls; He reports he has been trying to call and schedule his transportation pick up and isn't sure why they keep bringing him at the wrong time; Denies any soreness; reports fatigue after last session;     Pertinent History   Patient is a pleasant 53 year old male who presents to physical therapy for weakness and immobility secondary to CVA.  Had a stroke in Feb 2018. He was previously fully independent, but this stroke caused severe residual deficits, mainly on the right side as well as speech, and now he is unable to walk or perform most of his ADLs on his own. Entire right side is very weak. He still has a little difficulty with speech but his swallowing is improved to baseline. His mom had to move in with him and now is his main caregiver.     Limitations  Sitting;Lifting;Standing;Walking;House hold  activities    How long can you sit comfortably?  5 minutes    How long can you stand comfortably?  5 minutes    How long can you walk comfortably?  10 ft    Patient Stated Goals  Walk without walker, walk further with walker, get some strength in back     Currently in Pain?  No/denies    Multiple Pain Sites  No         NustepLvl 2x 4 minutes RPM> 60 seconds  Transfers: sit<>Stand with RW, CGA  Patient expressed that he has not been as adherent to all his exercise because he has lost some of his papers and requested to have a new print out; Advanced HEP: Seated red tband around BLE: -hip flexion march x15 bilaterally; -hip abduction/ER x15 bilaterally;  Without band: LAQ with ankle DF x15 bilaterally; Ankle DF/PF AROM x15 bilaterally; Patient required min VCS with all exercise for correct positioning and exercise technique; He denies any increase in pain; Does require cues to avoid compensation for good posture and positioning during exercise;   Seated Red tband BUE scapular retraction x15 reps with min Vcs to improve erect posture; educated patient in how to do exercise at home by tying band to doorknob.   Gait Training  Ambulatein hallway80 feet x1 with RWand CGA with WC follow, able to progress gait distance with no standing or sitting rest break; Verbaland manualcues foranteriorweight shiftprior to each step with glute activation and upright posture. Verbal cues for heel contact for each step.Tactile cues for gluteal activation to improve step lengthand RLE stance time.Patient haslimitedvelocity resulting in prolonged timed needed for distance ambulation.Patient demonstrated increased velocity as well as improved gait mechanics following first rest break, however continues to have heavy foot drag with increased duration due to fatigue and weakness.                         PT Education - 03/12/18 1258    Education provided  Yes     Education Details  exercise technique, gait safety, HEP reinforced;     Person(s) Educated  Patient    Methods  Explanation;Demonstration;Verbal cues    Comprehension  Verbalized understanding;Returned demonstration;Verbal cues required;Need further instruction       PT Short Term Goals - 02/12/18 1422      PT SHORT TERM GOAL #1   Title  Patient will be independent in home exercise program to improve strength/mobility for better functional independence with ADLs.    Baseline  hep compliant    Time  2    Period  Weeks    Status  Achieved      PT SHORT TERM GOAL #2   Title  Patient will require min cueing for STS transfer with CGA for increased independence with mobility.     Baseline  CGA    Time  2    Period  Weeks    Status  Achieved      PT SHORT TERM GOAL #3   Title  Patient will maintain upright posture for > 15 seconds to demonstrate strengthened postural control muscualture     Baseline  18 seconds     Time  2    Period  Weeks    Status  Achieved        PT Long Term Goals - 02/12/18 1337      PT LONG TERM GOAL #1   Title  Patient will perform bed mobility with Supervision to increase independent mobility    Baseline  able to perform mod I for increased time , occasional Min A for scooting up in bed; 7/16: min guard for safety with increased effort and time, cues to scoot back in bed before sit>supine 9/12: can do it independently but with difficulty    Time  8    Period  Weeks    Status  Achieved      PT LONG TERM GOAL #2   Title  Patient (< 22 years old) will complete five times sit to stand test in < 10 seconds indicating an increased LE strength and improved balance.    Baseline  2/18: 30 seconds from plinth chair height single UE on seat and single on walker ; 32 seconds from plinth with single UE on seat and single on walker. 4/10: 26 seconds from [facility] with one hand on walker5/29: 22.2 seconds with one hand on walker one hand on WC; 7/16: one hand on walker one  hand on WC 18.96 seconds; pt achieving 75% of full upright standing 8/22:one hand on walker one hand on WC 18 seconds; pt achieving 75% of full upright standing  9/12: 18 seconds econds; pt achieving 75% of full upright standing 8/22:one hand on walker one hand on  WC 10/15: 17seconds; pt achieving 75% of full upright posture 11/7: 16 seconds; pt achieving 75 upright posture    Time  8    Period  Weeks    Status  Partially Met    Target Date  04/09/18      PT LONG TERM GOAL #3   Title  Patient will ambulate 40 ft with walker with no breaks to allow for increased mobility within home.     Baseline  12/26: 29 ft with walker and WC follow with no rest breaks; 2/18: multiple trials previous session of >100 ft     Time  8    Period  Weeks    Status  Achieved      PT LONG TERM GOAL #4   Title  Patient will increase BLE gross strength to 4+/5 as to improve functional strength for independent gait, increased standing tolerance and increased ADL ability.    Baseline   4-/5 RLE, 10/15: 4/5 RLE 11/7: 4/5 RLE    Time  8    Period  Weeks    Status  Partially Met    Target Date  04/09/18      PT LONG TERM GOAL #5   Title  Patient will increase lower extremity functional scale to >40/80 to demonstrate improved functional mobility and increased tolerance with ADLs.     Baseline  14/80; 12/26: 21/80; 2/18: 28/80  4/10; 25/80 5/29: 27/80; 7/16: 29/80 8/22: 30/80  9/12: 26/80 (feeling more tired due to moving) 10/15: 37/80 11/7: 23/80    Time  8    Period  Weeks    Status  Partially Met    Target Date  04/09/18      Additional Long Term Goals   Additional Long Term Goals  Yes      PT LONG TERM GOAL #6   Title  Patient will walk through a door involving one small step up/down to increase functional independence.     Baseline  require CGA  BUE support in // bars at this time 10/15: required BUE and CGA in //bars    Time  8    Period  Weeks    Status  Deferred      PT LONG TERM GOAL #7   Title   Patient will ambulate 40 ft with least assistive device and Supervision with no breaks to allow for increased mobility within home.     Baseline  requires use of walker and CGA     Time  8    Period  Weeks    Status  Achieved      PT LONG TERM GOAL #8   Title  Patient will perform 10 MWT in < 1 minute to demonstrate improved ambulatory velocity and capacity.     Baseline  4/10: 2 minutes 45 seconds; 5/29: 2 minute 31 seconds; 3 minutes 22 seconds 8/22: 3 minutes 2 seconds  9/12:  2 minutes and 43 seconds 10/15: 62mnute 12 seconds 11/7: 1 minute 21 seconds    Time  8    Period  Weeks    Status  Partially Met    Target Date  04/09/18      PT LONG TERM GOAL  #9   TITLE  Patient will increase Berg Balance score by > 6 points (26/56) to demonstrate decreased fall risk during functional activities.    Baseline  9/12: 20/56 10/15: 21/56 11/7: 20/56    Time  8    Period  Weeks  Status  Partially Met    Target Date  04/09/18      PT LONG TERM GOAL  #10   TITLE  Patient (< 64 years old) will complete five times sit to stand test with full upright posture in < 30 seconds indicating an increased glute activation, LE strength and improved balance.    Baseline  10/15: 37seconds 11/7: 20 seconds    Time  8    Period  Weeks    Status  Achieved      PT LONG TERM GOAL  #11   TITLE  Patient will ambulate 100 ft with least assistive device and Supervision with no breaks to allow for increased mobility within home.     Baseline  10/17: 36f 11/7: 1013f   Time  8    Period  Weeks    Status  Achieved      PT LONG TERM GOAL  #12   TITLE  Patient (< [age]ears old) will complete five times sit to stand test with full upright posture in < 15 seconds indicating increased glute activation, LE strength and improved balance.    Baseline  11/7: 20 seconds    Time  8    Period  Weeks    Status  New    Target Date  04/09/18      PT LONG TERM GOAL  #13   TITLE  Patient will ambulate 150 ft with least  assistive device and Supervision with no breaks to allow for increased mobility within home.     Baseline  11/7: 1009f  Time  8    Period  Weeks    Status  New    Target Date  04/09/18            Plan - 03/12/18 1521    Clinical Impression Statement  Patient initally reported adherence to HEP; however upon further examination he reports that he doesn't do all the exercise because he can't remember them and he lost some of his paper. PT instructed patient in advanced LE strengthening exercise, initiating tband strengthening in sitting. Patient able to don/doff tband independently with increased time; He was able to exhibit good form and ROM with instruction to avoid compensation for optimal strengthening; Instructed patient in gait training. He was able to exhibit improved gait mechanics walking 80 feet without rest break. However with fatigue he does exhibit forward flexed posture with slightly decreased step length on LLE due to weakness with RLE hip extenson; Patient would benefit from additional skilled PT Intervention to improve strength, balance and gait safety;     Clinical Impairments Affecting Rehab Potential  hx of HTN, HLD, CVA, learning disability, Diabetes, brain tumor,     PT Frequency  2x / week    PT Duration  8 weeks    PT Treatment/Interventions  ADLs/Self Care Home Management;Aquatic Therapy;Ultrasound;Moist Heat;Traction;DME Instruction;Gait training;Stair training;Functional mobility training;Therapeutic activities;Therapeutic exercise;Orthotic Fit/Training;Neuromuscular re-education;Balance training;Patient/family education;Manual techniques;Wheelchair mobility training;Passive range of motion;Energy conservation;Taping;Visual/perceptual remediation/compensation    PT Next Visit Plan  balance and stability and ambulation, anterior weight shift hold    PT Home Exercise Plan  TrA contraction     Consulted and Agree with Plan of Care  Patient       Patient will benefit  from skilled therapeutic intervention in order to improve the following deficits and impairments:  Abnormal gait, Decreased activity tolerance, Decreased balance, Decreased knowledge of precautions, Decreased endurance, Decreased coordination, Decreased knowledge of use of DME,  Decreased mobility, Decreased range of motion, Difficulty walking, Decreased safety awareness, Decreased strength, Impaired flexibility, Impaired perceived functional ability, Impaired tone, Postural dysfunction, Improper body mechanics, Pain  Visit Diagnosis: Muscle weakness (generalized)  Other lack of coordination  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 Acute CVA (cerebrovascular accident) ([place]) 05/02/2016  . Left-sided weakness 05/01/2016    Emonie Espericueta PT, DPT 03/12/2018, 3:23 PM  [place] MAIN [facility] SERVICES [address] [place], [place], [address] Phone: [phone]   Fax:  [phone]  Name: Curtis Powell MRN: [id number] Date of Birth: 01-Oct-1965

## 2018-03-17 ENCOUNTER — Encounter: Payer: Self-pay | Admitting: Physical Therapy

## 2018-03-17 ENCOUNTER — Encounter: Payer: Self-pay | Admitting: Occupational Therapy

## 2018-03-17 ENCOUNTER — Ambulatory Visit: Payer: Medicare HMO | Admitting: Physical Therapy

## 2018-03-17 ENCOUNTER — Ambulatory Visit: Payer: Medicare HMO | Admitting: Occupational Therapy

## 2018-03-17 DIAGNOSIS — R278 Other lack of coordination: Secondary | ICD-10-CM

## 2018-03-17 DIAGNOSIS — I69351 Hemiplegia and hemiparesis following cerebral infarction affecting right dominant side: Secondary | ICD-10-CM

## 2018-03-17 DIAGNOSIS — M6281 Muscle weakness (generalized): Secondary | ICD-10-CM | POA: Diagnosis not present

## 2018-03-17 DIAGNOSIS — R2689 Other abnormalities of gait and mobility: Secondary | ICD-10-CM

## 2018-03-17 NOTE — Therapy (Addendum)
[place] MAIN [facility] SERVICES [address] [place], [place], [address] Phone: [phone]   Fax:  [phone]  Occupational Therapy Treatment  Patient Details  Name: Curtis Powell MRN: [id number] Date of Birth: [birth year] No data recorded  Encounter Date: 03/17/2018  OT End of Session - 03/17/18 1420    Visit Number  39    Number of Visits  64    Date for OT Re-Evaluation  04/08/18    Authorization Type  Visit 4 of 10 for progress reporting period starting 02/26/2018    OT Start Time  1415    OT Stop Time  1500    OT Time Calculation (min)  45 min    Activity Tolerance  Patient tolerated treatment well    Behavior During Therapy  [facility] for tasks assessed/performed       Past Medical History:  Diagnosis Date  . GERD (gastroesophageal reflux disease)   . Hyperlipidemia   . Hypertension   . Obesity   . Stroke [facility])     Past Surgical History:  Procedure Laterality Date  . BRAIN SURGERY      There were no vitals filed for this visit.  Subjective Assessment - 03/17/18 1415    Subjective   Pt.'s transportation brough the pt. early today.    Pertinent History  Pt. is a 52 y.o. male who suffered a CVA on 05/01/2016. Pt. was admitted to the hospital. Once discharged, he received Home Health PT and OT services for about a month. Pt. has had multiple CVAs over the past 8 years, and has had multiple falls in the past 6 months. Pt. resides in an apartment. Pt. has caregivers for 80 hours. Pt.'s mother stays with pt. at night, and assists with IADL tasks.      Patient Stated Goals  To be able to throw a ball and dribble a ball, do as much as I can for myself.     Currently in Pain?  No/denies      OT TREATMENT    Therapeutic Exercise:  Pt. performed bilateral gross gripping with grip strengthener. Pt. worked on sustaining grip while grasping pegs and reaching at various heights. The gripper was placed in the 3rd resistive slot with the white  resistive spring.  Selfcare:  Pt. worked on Tree surgeon. Pt. worked on [employer]: including listing the ingredients, equipment, and cooking directions. Pt. required increased time to complete, and formulated large print letters with 75% legibility.  Pt. required increased time for making a list of the ingredients, and was unable to complete the directions.                            OT Education - 03/17/18 1416    Education provided  Yes    Education Details  RUE Functioning    Person(s) Educated  Patient    Methods  Explanation;Demonstration    Comprehension  Verbalized understanding;Returned demonstration;Verbal cues required       OT Short Term Goals - 03/03/17 1459      OT SHORT TERM GOAL #1   Title  `        OT Long Term Goals - 02/24/18 1445      OT LONG TERM GOAL #2   Title  Pt. will complete self grooming with minA    Baseline  02/24/2018 - Pt is independent with oral care and washing his face and  occasionally requires increased time if he is having a bad day. Pt's shaving is completed by his barber.    Time  12    Period  Weeks    Status  On-going    Target Date  05/19/18      OT LONG TERM GOAL #4   Title  Pt. will perform self dressing with minA and A/E as needed.    Baseline  02/24/2018 - Pt. continues to improve LB dressing techniques while seated. Pt required extensive time and mod A to complete LB dressing. Pt. requires min A for UB dressing.    Time  12    Period  Weeks    Status  On-going    Target Date  05/19/18      OT LONG TERM GOAL #6   Title  Pt. will write his name efficiently with 100% legibility    Baseline  02/24/2018 - Pt to continue to work to increase legibility and efficiency when writing name to sign in when riding the [place]    Time  8    Period  Weeks    Status  On-going    Target Date  04/21/18      OT LONG TERM GOAL #7   Title  Pt will independently and consistently follow HEP to  increase UE strength to increase functional independence    Baseline  02/24/2018 - Pt to continue to work towards independence with exercises    Time  6    Period  Weeks    Status  On-going    Target Date  04/07/18            Plan - 03/17/18 1421    Clinical Impression Statement  Pt. is making progress. Pt. Is able to make sandwiches at home. Pain has improved overall in the right shoulder. Pt. continues to present with limited UE strength, and [facility] skills making it difficult to perform reaching duirng ADLs, writing,  LE self-care skills, and IADLs. Pt. continues to work on improving UE strength, and [facility] skills for improved ADL, and IADL functioning.       Occupational performance deficits (Please refer to evaluation for details):  ADL's;IADL's    Rehab Potential  Fair    OT Frequency  2x / week    OT Duration  12 weeks    OT Treatment/Interventions  Self-care/ADL training;Patient/family education;DME and/or AE instruction;Therapeutic exercise;Moist Heat;Neuromuscular education    Clinical Decision Making  Several treatment options, min-mod task modification necessary    Consulted and Agree with Plan of Care  Patient       Patient will benefit from skilled therapeutic intervention in order to improve the following deficits and impairments:  Pain, Decreased cognition, Decreased activity tolerance, Impaired UE functional use, Decreased range of motion, Decreased coordination, Decreased endurance  Visit Diagnosis: Muscle weakness (generalized)  Other lack of coordination    Problem List Patient Active Problem List   Diagnosis Date Noted  . Acute CVA (cerebrovascular accident) ([place]) 05/02/2016  . Left-sided weakness 05/01/2016    Harrel Carina, MS, OTR/L 03/17/2018, 2:51 PM  [place] MAIN [facility] SERVICES [address] [place], [place], [address] Phone: [phone]   Fax:  [phone]  Name: Curtis Powell MRN: [id number] Date of  Birth: 1965/07/04

## 2018-03-17 NOTE — Therapy (Signed)
[place] MAIN [facility] SERVICES [address] [place], [place], [address] Phone: [phone]   Fax:  [phone]  Physical Therapy Treatment  Patient Details  Name: Curtis Powell MRN: [id number] Date of Birth: Jan 06, 1966 No data recorded  Encounter Date: 03/17/2018  PT End of Session - 03/17/18 1726    Visit Number  58    Number of Visits  65    Date for PT Re-Evaluation  04/09/18    Authorization Type  PN 7/10 starts 11/7)    PT Start Time  0330    PT Stop Time  0310    PT Time Calculation (min)  1420 min    Equipment Utilized During Treatment  Gait belt    Activity Tolerance  Patient tolerated treatment well    Behavior During Therapy  WFL for tasks assessed/performed       Past Medical History:  Diagnosis Date  . GERD (gastroesophageal reflux disease)   . Hyperlipidemia   . Hypertension   . Obesity   . Stroke [facility])     Past Surgical History:  Procedure Laterality Date  . BRAIN SURGERY      There were no vitals filed for this visit.  Subjective Assessment - 03/17/18 1618    Subjective  Patient reports doing well; Denies any new falls;     Pertinent History   Patient is a pleasant 52 year old male who presents to physical therapy for weakness and immobility secondary to CVA.  Had a stroke in Feb 2018. He was previously fully independent, but this stroke caused severe residual deficits, mainly on the right side as well as speech, and now he is unable to walk or perform most of his ADLs on his own. Entire right side is very weak. He still has a little difficulty with speech but his swallowing is improved to baseline. His mom had to move in with him and now is his main caregiver.     Limitations  Sitting;Lifting;Standing;Walking;House hold activities    How long can you sit comfortably?  5 minutes    How long can you stand comfortably?  5 minutes    How long can you walk comfortably?  10 ft    Patient Stated Goals  Walk without  walker, walk further with walker, get some strength in back     Pain Onset  More than a month ago       Treatment: Sit to stand from mat <> wc, wc<> to toilet  Supine hip flex x10  Verbal cues to complete slowly   SAQ x 10 Verbal cues    Bridges x10 3 seconds holds.    LAQ x 10. Verbal cues to complete slowly     hooklying hip abd/ER with gTB x 20     Supine hip abd with 2# Ankle weight x 10 BLE  Assisted to rest room and CGA for pulling up pants and adjusting garments with RW  Gait training with RW 30 feet with CGA and slow gait speed .  [place] for positioning and correct alignment for exercise                         PT Education - 03/17/18 1620    Education provided  Yes    Education Details  HEP    Person(s) Educated  Patient    Methods  Explanation    Comprehension  Verbalized understanding  PT Short Term Goals - 02/12/18 1422      PT SHORT TERM GOAL #1   Title  Patient will be independent in home exercise program to improve strength/mobility for better functional independence with ADLs.    Baseline  hep compliant    Time  2    Period  Weeks    Status  Achieved      PT SHORT TERM GOAL #2   Title  Patient will require min cueing for STS transfer with CGA for increased independence with mobility.     Baseline  CGA    Time  2    Period  Weeks    Status  Achieved      PT SHORT TERM GOAL #3   Title  Patient will maintain upright posture for > 15 seconds to demonstrate strengthened postural control muscualture     Baseline  18 seconds     Time  2    Period  Weeks    Status  Achieved        PT Long Term Goals - 02/12/18 1337      PT LONG TERM GOAL #1   Title  Patient will perform bed mobility with Supervision to increase independent mobility    Baseline  able to perform mod I for increased time , occasional Min A for scooting up in bed; 7/16: min guard for safety with increased effort and time, cues to scoot back in bed before  sit>supine 9/12: can do it independently but with difficulty    Time  8    Period  Weeks    Status  Achieved      PT LONG TERM GOAL #2   Title  Patient (< 15 years old) will complete five times sit to stand test in < 10 seconds indicating an increased LE strength and improved balance.    Baseline  2/18: 30 seconds from plinth chair height single UE on seat and single on walker ; 32 seconds from plinth with single UE on seat and single on walker. 4/10: 26 seconds from [facility] with one hand on walker5/29: 22.2 seconds with one hand on walker one hand on WC; 7/16: one hand on walker one hand on WC 18.96 seconds; pt achieving 75% of full upright standing 8/22:one hand on walker one hand on WC 18 seconds; pt achieving 75% of full upright standing  9/12: 18 seconds econds; pt achieving 75% of full upright standing 8/22:one hand on walker one hand on WC 10/15: 17seconds; pt achieving 75% of full upright posture 11/7: 16 seconds; pt achieving 75 upright posture    Time  8    Period  Weeks    Status  Partially Met    Target Date  04/09/18      PT LONG TERM GOAL #3   Title  Patient will ambulate 40 ft with walker with no breaks to allow for increased mobility within home.     Baseline  12/26: 29 ft with walker and WC follow with no rest breaks; 2/18: multiple trials previous session of >100 ft     Time  8    Period  Weeks    Status  Achieved      PT LONG TERM GOAL #4   Title  Patient will increase BLE gross strength to 4+/5 as to improve functional strength for independent gait, increased standing tolerance and increased ADL ability.    Baseline   4-/5 RLE, 10/15: 4/5 RLE 11/7: 4/5 RLE  Time  8    Period  Weeks    Status  Partially Met    Target Date  04/09/18      PT LONG TERM GOAL #5   Title  Patient will increase lower extremity functional scale to >40/80 to demonstrate improved functional mobility and increased tolerance with ADLs.     Baseline  14/80; 12/26: 21/80; 2/18: 28/80  4/10; 25/80  5/29: 27/80; 7/16: 29/80 8/22: 30/80  9/12: 26/80 (feeling more tired due to moving) 10/15: 37/80 11/7: 23/80    Time  8    Period  Weeks    Status  Partially Met    Target Date  04/09/18      Additional Long Term Goals   Additional Long Term Goals  Yes      PT LONG TERM GOAL #6   Title  Patient will walk through a door involving one small step up/down to increase functional independence.     Baseline  require CGA  BUE support in // bars at this time 10/15: required BUE and CGA in //bars    Time  8    Period  Weeks    Status  Deferred      PT LONG TERM GOAL #7   Title  Patient will ambulate 40 ft with least assistive device and Supervision with no breaks to allow for increased mobility within home.     Baseline  requires use of walker and CGA     Time  8    Period  Weeks    Status  Achieved      PT LONG TERM GOAL #8   Title  Patient will perform 10 MWT in < 1 minute to demonstrate improved ambulatory velocity and capacity.     Baseline  4/10: 2 minutes 45 seconds; 5/29: 2 minute 31 seconds; 3 minutes 22 seconds 8/22: 3 minutes 2 seconds  9/12:  2 minutes and 43 seconds 10/15: 21mnute 12 seconds 11/7: 1 minute 21 seconds    Time  8    Period  Weeks    Status  Partially Met    Target Date  04/09/18      PT LONG TERM GOAL  #9   TITLE  Patient will increase Berg Balance score by > 6 points (26/56) to demonstrate decreased fall risk during functional activities.    Baseline  9/12: 20/56 10/15: 21/56 11/7: 20/56    Time  8    Period  Weeks    Status  Partially Met    Target Date  04/09/18      PT LONG TERM GOAL  #10   TITLE  Patient (< 661years old) will complete five times sit to stand test with full upright posture in < 30 seconds indicating an increased glute activation, LE strength and improved balance.    Baseline  10/15: 37seconds 11/7: 20 seconds    Time  8    Period  Weeks    Status  Achieved      PT LONG TERM GOAL  #11   TITLE  Patient will ambulate 100 ft with least  assistive device and Supervision with no breaks to allow for increased mobility within home.     Baseline  10/17: 849f11/7: 10019f  Time  8    Period  Weeks    Status  Achieved      PT LONG TERM GOAL  #12   TITLE  Patient (< 60 67ars old) will complete  five times sit to stand test with full upright posture in < 15 seconds indicating increased glute activation, LE strength and improved balance.    Baseline  11/7: 20 seconds    Time  8    Period  Weeks    Status  New    Target Date  04/09/18      PT LONG TERM GOAL  #13   TITLE  Patient will ambulate 150 ft with least assistive device and Supervision with no breaks to allow for increased mobility within home.     Baseline  11/7: 155f    Time  8    Period  Weeks    Status  New    Target Date  04/09/18            Plan - 03/17/18 1727    Clinical Impression Statement  Patient required min verbal cueing during strengthening exercise to correct posture and form. Patient demonstrates ability to perform strengthening exercises with no pain but with moderate fatigue. Patient will continue to benefit from continued skilled therapy in order to improve dynamic standing balance and increase strength in order to decrease falls    Clinical Impairments Affecting Rehab Potential  hx of HTN, HLD, CVA, learning disability, Diabetes, brain tumor,     PT Frequency  2x / week    PT Duration  8 weeks    PT Treatment/Interventions  ADLs/Self Care Home Management;Aquatic Therapy;Ultrasound;Moist Heat;Traction;DME Instruction;Gait training;Stair training;Functional mobility training;Therapeutic activities;Therapeutic exercise;Orthotic Fit/Training;Neuromuscular re-education;Balance training;Patient/family education;Manual techniques;Wheelchair mobility training;Passive range of motion;Energy conservation;Taping;Visual/perceptual remediation/compensation    PT Next Visit Plan  balance and stability and ambulation, anterior weight shift hold    PT Home  Exercise Plan  TrA contraction     Consulted and Agree with Plan of Care  Patient       Patient will benefit from skilled therapeutic intervention in order to improve the following deficits and impairments:  Abnormal gait, Decreased activity tolerance, Decreased balance, Decreased knowledge of precautions, Decreased endurance, Decreased coordination, Decreased knowledge of use of DME, Decreased mobility, Decreased range of motion, Difficulty walking, Decreased safety awareness, Decreased strength, Impaired flexibility, Impaired perceived functional ability, Impaired tone, Postural dysfunction, Improper body mechanics, Pain  Visit Diagnosis: Muscle weakness (generalized)  Other lack of coordination  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 [place]  . Left-sided weakness 05/01/2016    MAlanson Puls PVirginiaDPT 03/17/2018, 5:29 PM  [place] [facility] [address] [address]Phone: [phone]  Fax:  [phone] Name: Curtis DANTESMRN: [id number] of Birth: 9November 21, 1967

## 2018-03-19 ENCOUNTER — Encounter: Payer: Self-pay | Admitting: Physical Therapy

## 2018-03-19 ENCOUNTER — Ambulatory Visit: Payer: Medicare HMO | Admitting: Occupational Therapy

## 2018-03-19 ENCOUNTER — Encounter: Payer: Self-pay | Admitting: Occupational Therapy

## 2018-03-19 ENCOUNTER — Ambulatory Visit: Payer: Medicare HMO | Admitting: Physical Therapy

## 2018-03-19 DIAGNOSIS — R278 Other lack of coordination: Secondary | ICD-10-CM

## 2018-03-19 DIAGNOSIS — M6281 Muscle weakness (generalized): Secondary | ICD-10-CM

## 2018-03-19 DIAGNOSIS — I69351 Hemiplegia and hemiparesis following cerebral infarction affecting right dominant side: Secondary | ICD-10-CM

## 2018-03-19 DIAGNOSIS — R2689 Other abnormalities of gait and mobility: Secondary | ICD-10-CM

## 2018-03-19 NOTE — Therapy (Signed)
[place] MAIN [facility] SERVICES [address] [place], [place], [address] Phone: [phone]   Fax:  [phone]  Occupational Therapy Treatment  Patient Details  Name: Curtis Powell MRN: [id number] Date of Birth: 05-Apr-1966 No data recorded  Encounter Date: 03/19/2018  OT End of Session - 03/19/18 1212    Visit Number  72    Number of Visits  86    Date for OT Re-Evaluation  04/08/18    Authorization Type  Visit 5 of 10 for progress reporting period starting 02/26/2018    OT Start Time  1145    OT Stop Time  1230    OT Time Calculation (min)  45 min    Equipment Utilized During Treatment  Long handled shoe horn    Activity Tolerance  Patient tolerated treatment well    Behavior During Therapy  WFL for tasks assessed/performed       Past Medical History:  Diagnosis Date  . GERD (gastroesophageal reflux disease)   . Hyperlipidemia   . Hypertension   . Obesity   . Stroke [facility])     Past Surgical History:  Procedure Laterality Date  . BRAIN SURGERY      There were no vitals filed for this visit.  Subjective Assessment - 03/19/18 1211    Subjective   Pt. rpeorts being tired today.    Patient Stated Goals  To be able to throw a ball and dribble a ball, do as much as I can for myself.     Currently in Pain?  No/denies      OT TREATMENT    Neuro muscular re-education:  Pt. worked on [facility] grasping 1/2" small magnetic pegs, disconnecting them, and placing them on a positioned at a vertical angle at a tabletop, on an elevated angle to encourage [facility] skills with arms sustained in elevation. Pt. Worked on removing the pegs while alternating thumb opposition to the tip of the 2nd through 5th digits.  Pt. worked on grasping heavy duty resistive magnets of varying sizes. Pt. worked on placing them on a whiteboard positioned at a vertical angle on the tabletop, on an elevated surface to encourage [facility] while UEs are elevated. Pt. worked on  Hospital doctor with resistance. Pt. worked on grasping, and positioning magnetic hooks on a whiteboard positioned at a vertical angle on the tabletop. Pt. Worked on [facility] skills grasping 1/2" flat washers, and placing them on the hooks.   Self-care:  Pt. performed toileting skills with CGA in standing, and hiking pants.                        OT Education - 03/19/18 1211    Education provided  Yes    Education Details  RUE Functioning    Person(s) Educated  Patient    Methods  Explanation;Demonstration    Comprehension  Verbalized understanding;Returned demonstration;Verbal cues required       OT Short Term Goals - 03/03/17 1459      OT SHORT TERM GOAL #1   Title  `        OT Long Term Goals - 02/24/18 1445      OT LONG TERM GOAL #2   Title  Pt. will complete self grooming with minA    Baseline  02/24/2018 - Pt is independent with oral care and washing his face and occasionally requires increased time if he is having a bad day. Pt's shaving is completed by  his Art gallery manager.    Time  12    Period  Weeks    Status  On-going    Target Date  05/19/18      OT LONG TERM GOAL #4   Title  Pt. will perform self dressing with minA and A/E as needed.    Baseline  02/24/2018 - Pt. continues to improve LB dressing techniques while seated. Pt required extensive time and mod A to complete LB dressing. Pt. requires min A for UB dressing.    Time  12    Period  Weeks    Status  On-going    Target Date  05/19/18      OT LONG TERM GOAL #6   Title  Pt. will write his name efficiently with 100% legibility    Baseline  02/24/2018 - Pt to continue to work to increase legibility and efficiency when writing name to sign in when riding the [place]    Time  8    Period  Weeks    Status  On-going    Target Date  04/21/18      OT LONG TERM GOAL #7   Title  Pt will independently and consistently follow HEP to increase UE strength to increase functional independence     Baseline  02/24/2018 - Pt to continue to work towards independence with exercises    Time  6    Period  Weeks    Status  On-going    Target Date  04/07/18            Plan - 03/19/18 1212    Clinical Impression Statement  Pt. is more fatigued today, and presents with limited RUE strength, and [facility] skills. Pt. worked on [facility] skills, and placing objects at precise targets with sustained reaching,  Pt. continues to work on increasing ROM during ADLs, and IADL tasks.     Occupational performance deficits (Please refer to evaluation for details):  ADL's;IADL's    Rehab Potential  Fair    OT Frequency  2x / week    OT Duration  12 weeks    OT Treatment/Interventions  Self-care/ADL training;Patient/family education;DME and/or AE instruction;Therapeutic exercise;Moist Heat;Neuromuscular education    Clinical Decision Making  Several treatment options, min-mod task modification necessary    Consulted and Agree with Plan of Care  Patient       Patient will benefit from skilled therapeutic intervention in order to improve the following deficits and impairments:  Pain, Decreased cognition, Decreased activity tolerance, Impaired UE functional use, Decreased range of motion, Decreased coordination, Decreased endurance  Visit Diagnosis: Muscle weakness (generalized)  Other lack of coordination    Problem List Patient Active Problem List   Diagnosis Date Noted  . Acute CVA (cerebrovascular accident) ([place]) 05/02/2016  . Left-sided weakness 05/01/2016    Harrel Carina, MS, OTR/L 03/19/2018, 12:26 PM  [place] MAIN [facility] SERVICES [address] [place], [place], [address] Phone: [phone]   Fax:  [phone]  Name: Curtis Powell MRN: [id number] Date of Birth: 09-04-1965

## 2018-03-19 NOTE — Therapy (Signed)
[place] MAIN [facility] SERVICES [address] [place], [place], [address] Phone: [phone]   Fax:  [phone]  Physical Therapy Treatment  Patient Details  Name: Curtis Powell MRN: [id number] Date of Birth: 08-24-1965 No data recorded  Encounter Date: 03/19/2018  PT End of Session - 03/19/18 1239    Visit Number  17    Number of Visits  26    Date for PT Re-Evaluation  04/09/18    Authorization Type  PN 8/10 starts 11/7)    PT Start Time  1302    PT Stop Time  1345    PT Time Calculation (min)  43 min    Equipment Utilized During Treatment  Gait belt    Activity Tolerance  Patient tolerated treatment well    Behavior During Therapy  WFL for tasks assessed/performed       Past Medical History:  Diagnosis Date  . GERD (gastroesophageal reflux disease)   . Hyperlipidemia   . Hypertension   . Obesity   . Stroke [facility])     Past Surgical History:  Procedure Laterality Date  . BRAIN SURGERY      There were no vitals filed for this visit.  Subjective Assessment - 03/19/18 1310    Subjective  Patient reports doing well; denies any pain; denies any new falls; He reports, "my aide has been trying to help me with my band exercises."     Pertinent History   Patient is a pleasant 52 year old male who presents to physical therapy for weakness and immobility secondary to CVA.  Had a stroke in Feb 2018. He was previously fully independent, but this stroke caused severe residual deficits, mainly on the right side as well as speech, and now he is unable to walk or perform most of his ADLs on his own. Entire right side is very weak. He still has a little difficulty with speech but his swallowing is improved to baseline. His mom had to move in with him and now is his main caregiver.     Limitations  Sitting;Lifting;Standing;Walking;House hold activities    How long can you sit comfortably?  5 minutes    How long can you stand comfortably?  5 minutes     How long can you walk comfortably?  10 ft    Patient Stated Goals  Walk without walker, walk further with walker, get some strength in back     Currently in Pain?  No/denies    Pain Onset  More than a month ago    Multiple Pain Sites  No       TREATMENT: Transfers:sit<>Stand with RW, CGA  Patient expressed that he has not been as adherent to all his exercise because he has lost some of his papers and requested to have a new print out; Advanced HEP: Seated red tband around BLE: -hip flexion march x15 bilaterally; -hip abduction/ER x15 bilaterally;  With red tband band: LAQ with ankle DF x15 bilaterally; Ankle DF AROM x15 bilaterally; Patient required min VCS with all exercise for correct positioning and exercise technique; He denies any increase in pain; Does require cues to avoid compensation for good posture and positioning during exercise;   Standing Hip flexion march with RW x10 bilaterally with mod VCs to increase hip extension on stance leg for better hip flexion on swing leg; also required min VCs for weight for better balance;    Standing: Stepping over 1/2 bolster with B rail assist x5  reps each LE, with mod VCs for erect posture and to increase hip flexion for better foot clearance; Patient able to clear bolster, but does have difficulty taking long enough step to get to floor on other side of bolster;    Gait Training Ambulatein hallway100 feet x1 with RWand CGA with WC follow, able to progress gait distance with no standing or sitting rest break; Verbaland manualcues foranteriorweight shiftprior to each step with glute activation and upright posture. Verbal cues for heel contact for each step.Tactile cues for gluteal activation to improve step lengthand RLE stance time.Patient haslimitedvelocity resulting in prolonged timed needed for distance ambulation.Despite cues, patient often exhibits forward slumped posture with increased hip flexion in stance  due to gluteal weakness; He was able to take a better step but does have difficulty achieving heel strike;                        PT Education - 03/19/18 1239    Education provided  Yes    Education Details  HEP reinforced, strengthening, exercise technique/gait safety;     Person(s) Educated  Patient    Methods  Explanation;Verbal cues    Comprehension  Verbalized understanding;Returned demonstration;Verbal cues required;Need further instruction       PT Short Term Goals - 02/12/18 1422      PT SHORT TERM GOAL #1   Title  Patient will be independent in home exercise program to improve strength/mobility for better functional independence with ADLs.    Baseline  hep compliant    Time  2    Period  Weeks    Status  Achieved      PT SHORT TERM GOAL #2   Title  Patient will require min cueing for STS transfer with CGA for increased independence with mobility.     Baseline  CGA    Time  2    Period  Weeks    Status  Achieved      PT SHORT TERM GOAL #3   Title  Patient will maintain upright posture for > 15 seconds to demonstrate strengthened postural control muscualture     Baseline  18 seconds     Time  2    Period  Weeks    Status  Achieved        PT Long Term Goals - 02/12/18 1337      PT LONG TERM GOAL #1   Title  Patient will perform bed mobility with Supervision to increase independent mobility    Baseline  able to perform mod I for increased time , occasional Min A for scooting up in bed; 7/16: min guard for safety with increased effort and time, cues to scoot back in bed before sit>supine 9/12: can do it independently but with difficulty    Time  8    Period  Weeks    Status  Achieved      PT LONG TERM GOAL #2   Title  Patient (< 43 years old) will complete five times sit to stand test in < 10 seconds indicating an increased LE strength and improved balance.    Baseline  2/18: 30 seconds from plinth chair height single UE on seat and single on  walker ; 32 seconds from plinth with single UE on seat and single on walker. 4/10: 26 seconds from [facility] with one hand on walker5/29: 22.2 seconds with one hand on walker one hand on WC; 7/16: one hand on walker one  hand on WC 18.96 seconds; pt achieving 75% of full upright standing 8/22:one hand on walker one hand on WC 18 seconds; pt achieving 75% of full upright standing  9/12: 18 seconds econds; pt achieving 75% of full upright standing 8/22:one hand on walker one hand on WC 10/15: 17seconds; pt achieving 75% of full upright posture 11/7: 16 seconds; pt achieving 75 upright posture    Time  8    Period  Weeks    Status  Partially Met    Target Date  04/09/18      PT LONG TERM GOAL #3   Title  Patient will ambulate 40 ft with walker with no breaks to allow for increased mobility within home.     Baseline  12/26: 29 ft with walker and WC follow with no rest breaks; 2/18: multiple trials previous session of >100 ft     Time  8    Period  Weeks    Status  Achieved      PT LONG TERM GOAL #4   Title  Patient will increase BLE gross strength to 4+/5 as to improve functional strength for independent gait, increased standing tolerance and increased ADL ability.    Baseline   4-/5 RLE, 10/15: 4/5 RLE 11/7: 4/5 RLE    Time  8    Period  Weeks    Status  Partially Met    Target Date  04/09/18      PT LONG TERM GOAL #5   Title  Patient will increase lower extremity functional scale to >40/80 to demonstrate improved functional mobility and increased tolerance with ADLs.     Baseline  14/80; 12/26: 21/80; 2/18: 28/80  4/10; 25/80 5/29: 27/80; 7/16: 29/80 8/22: 30/80  9/12: 26/80 (feeling more tired due to moving) 10/15: 37/80 11/7: 23/80    Time  8    Period  Weeks    Status  Partially Met    Target Date  04/09/18      Additional Long Term Goals   Additional Long Term Goals  Yes      PT LONG TERM GOAL #6   Title  Patient will walk through a door involving one small step up/down to increase  functional independence.     Baseline  require CGA  BUE support in // bars at this time 10/15: required BUE and CGA in //bars    Time  8    Period  Weeks    Status  Deferred      PT LONG TERM GOAL #7   Title  Patient will ambulate 40 ft with least assistive device and Supervision with no breaks to allow for increased mobility within home.     Baseline  requires use of walker and CGA     Time  8    Period  Weeks    Status  Achieved      PT LONG TERM GOAL #8   Title  Patient will perform 10 MWT in < 1 minute to demonstrate improved ambulatory velocity and capacity.     Baseline  4/10: 2 minutes 45 seconds; 5/29: 2 minute 31 seconds; 3 minutes 22 seconds 8/22: 3 minutes 2 seconds  9/12:  2 minutes and 43 seconds 10/15: 42mnute 12 seconds 11/7: 1 minute 21 seconds    Time  8    Period  Weeks    Status  Partially Met    Target Date  04/09/18      PT LONG TERM GOAL  #  9   TITLE  Patient will increase Berg Balance score by > 6 points (26/56) to demonstrate decreased fall risk during functional activities.    Baseline  9/12: 20/56 10/15: 21/56 11/7: 20/56    Time  8    Period  Weeks    Status  Partially Met    Target Date  04/09/18      PT LONG TERM GOAL  #10   TITLE  Patient (< 19 years old) will complete five times sit to stand test with full upright posture in < 30 seconds indicating an increased glute activation, LE strength and improved balance.    Baseline  10/15: 37seconds 11/7: 20 seconds    Time  8    Period  Weeks    Status  Achieved      PT LONG TERM GOAL  #11   TITLE  Patient will ambulate 100 ft with least assistive device and Supervision with no breaks to allow for increased mobility within home.     Baseline  10/17: 73f 11/7: 1032f   Time  8    Period  Weeks    Status  Achieved      PT LONG TERM GOAL  #12   TITLE  Patient (< [age]ears old) will complete five times sit to stand test with full upright posture in < 15 seconds indicating increased glute activation, LE  strength and improved balance.    Baseline  11/7: 20 seconds    Time  8    Period  Weeks    Status  New    Target Date  04/09/18      PT LONG TERM GOAL  #13   TITLE  Patient will ambulate 150 ft with least assistive device and Supervision with no breaks to allow for increased mobility within home.     Baseline  11/7: 10071f  Time  8    Period  Weeks    Status  New    Target Date  04/09/18            Plan - 03/19/18 1332    Clinical Impression Statement  Patient instructed in advanced LE strengthening; progressed strengthening with increased resistance; He does require cues to increase AROM for better strengthening; Patient does fatigue quickly requiring short sitting rest breaks; Pt is progressing gait ability with increased distance without rest breaks; HOwever he requires increased cues to increase hip extension during mid stance for better step length and foot clearance; He does have trouble achieving heel strike at initial contact due to ankle weakness; Patient would benefit from additional skilled PT Intervention to improve strength, balance and gait safety;     Clinical Impairments Affecting Rehab Potential  hx of HTN, HLD, CVA, learning disability, Diabetes, brain tumor,     PT Frequency  2x / week    PT Duration  8 weeks    PT Treatment/Interventions  ADLs/Self Care Home Management;Aquatic Therapy;Ultrasound;Moist Heat;Traction;DME Instruction;Gait training;Stair training;Functional mobility training;Therapeutic activities;Therapeutic exercise;Orthotic Fit/Training;Neuromuscular re-education;Balance training;Patient/family education;Manual techniques;Wheelchair mobility training;Passive range of motion;Energy conservation;Taping;Visual/perceptual remediation/compensation    PT Next Visit Plan  balance and stability and ambulation, anterior weight shift hold    PT Home Exercise Plan  TrA contraction     Consulted and Agree with Plan of Care  Patient       Patient will  benefit from skilled therapeutic intervention in order to improve the following deficits and impairments:  Abnormal gait, Decreased activity tolerance, Decreased balance, Decreased knowledge  of precautions, Decreased endurance, Decreased coordination, Decreased knowledge of use of DME, Decreased mobility, Decreased range of motion, Difficulty walking, Decreased safety awareness, Decreased strength, Impaired flexibility, Impaired perceived functional ability, Impaired tone, Postural dysfunction, Improper body mechanics, Pain  Visit Diagnosis: Muscle weakness (generalized)  Other lack of coordination  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 Acute CVA (cerebrovascular accident) ([place]) 05/02/2016  . Left-sided weakness 05/01/2016    Shakena Callari PT, DPT 03/19/2018, 1:48 PM  [place] MAIN [facility] SERVICES [address] [place], [place], [address] Phone: [phone]   Fax:  [phone]  Name: Curtis Powell MRN: [id number] Date of Birth: 1965/07/07

## 2018-03-24 ENCOUNTER — Ambulatory Visit: Payer: Medicare HMO

## 2018-03-24 ENCOUNTER — Ambulatory Visit: Payer: Medicare HMO | Admitting: Occupational Therapy

## 2018-03-24 ENCOUNTER — Encounter: Payer: Self-pay | Admitting: Occupational Therapy

## 2018-03-24 DIAGNOSIS — R278 Other lack of coordination: Secondary | ICD-10-CM

## 2018-03-24 DIAGNOSIS — R2689 Other abnormalities of gait and mobility: Secondary | ICD-10-CM

## 2018-03-24 DIAGNOSIS — I69351 Hemiplegia and hemiparesis following cerebral infarction affecting right dominant side: Secondary | ICD-10-CM

## 2018-03-24 DIAGNOSIS — M6281 Muscle weakness (generalized): Secondary | ICD-10-CM

## 2018-03-24 NOTE — Therapy (Signed)
[place] MAIN [facility] SERVICES [address] [place], [place], [address] Phone: [phone]   Fax:  [phone]  Physical Therapy Treatment  Patient Details  Name: Curtis Powell MRN: [id number] Date of Birth: [birth year] No data recorded  Encounter Date: 03/24/2018  PT End of Session - 03/24/18 1524    Visit Number  63    Number of Visits  99    Date for PT Re-Evaluation  04/09/18    Authorization Type  PN 9/10 starts 11/7)    PT Start Time  1518    PT Stop Time  1602    PT Time Calculation (min)  44 min    Equipment Utilized During Treatment  Gait belt    Activity Tolerance  Patient tolerated treatment well    Behavior During Therapy  WFL for tasks assessed/performed       Past Medical History:  Diagnosis Date  . GERD (gastroesophageal reflux disease)   . Hyperlipidemia   . Hypertension   . Obesity   . Stroke [facility])     Past Surgical History:  Procedure Laterality Date  . BRAIN SURGERY      There were no vitals filed for this visit.  Subjective Assessment - 03/24/18 1523    Subjective  Patient reports compliance with HEP, performing them with his aide. Reports hes hungry since he hasn't eaten much since his daytime aide didn't come.     Pertinent History   Patient is a pleasant 52 year old male who presents to physical therapy for weakness and immobility secondary to CVA.  Had a stroke in Feb 2018. He was previously fully independent, but this stroke caused severe residual deficits, mainly on the right side as well as speech, and now he is unable to walk or perform most of his ADLs on his own. Entire right side is very weak. He still has a little difficulty with speech but his swallowing is improved to baseline. His mom had to move in with him and now is his main caregiver.     Limitations  Sitting;Lifting;Standing;Walking;House hold activities    How long can you sit comfortably?  5 minutes    How long can you stand comfortably?   5 minutes    How long can you walk comfortably?  10 ft    Patient Stated Goals  Walk without walker, walk further with walker, get some strength in back     Currently in Pain?  No/denies       Nustep Lvl 5 3 minutes RPM> 60 seconds.   Transfers: SPT with RW and CGA x2 trials    Gait Training Ambulate in hallway 80 feet x1 with RW and CGA  with WC follow, able to progress gait distance with no standing or sitting rest break;  Verbal and manual cues for anterior weight shift prior to each step with glute activation and upright posture.  Verbal cues for heel contact for each step. Tactile cues for gluteal activation to improve step length and RLE stance time.  Patient has limited velocity resulting in prolonged timed needed for distance ambulation.  Patient demonstrated increased velocity as well as improved gait mechanics following first rest break, however continues to have heavy foot drag with increased duration due to fatigue and weakness.    // bars:  Standing step overs RTB. Verbal cues to bring knee towards ceiling and landing with heel. x10 each LE BUE CGA. MinA to L foot for heel contact. Patient able  to complete independently after facilitation.    Seated:  Seated alternating ball kick (LAQ) s for coordination and reaction time x15 each leg.   Seated kicking soccer ball between cones for coordination and reduction of dysmetria for carryover in mobility with improved reaction time and responses. 10x each LE  5lb ankle weights: back away from back of chair for trunk stability challenge  Marching 15x each LE  Long arc quad 5 second holds, 10x    GTB adduction 10x each LE: seated                      PT Education - 03/24/18 1523    Education provided  Yes    Education Details  exercise technique, gait stability/safety, strengthening     Person(s) Educated  Patient    Methods  Explanation;Demonstration;Tactile cues;Verbal cues    Comprehension  Verbalized  understanding;Returned demonstration;Verbal cues required;Tactile cues required;Need further instruction       PT Short Term Goals - 02/12/18 1422      PT SHORT TERM GOAL #1   Title  Patient will be independent in home exercise program to improve strength/mobility for better functional independence with ADLs.    Baseline  hep compliant    Time  2    Period  Weeks    Status  Achieved      PT SHORT TERM GOAL #2   Title  Patient will require min cueing for STS transfer with CGA for increased independence with mobility.     Baseline  CGA    Time  2    Period  Weeks    Status  Achieved      PT SHORT TERM GOAL #3   Title  Patient will maintain upright posture for > 15 seconds to demonstrate strengthened postural control muscualture     Baseline  18 seconds     Time  2    Period  Weeks    Status  Achieved        PT Long Term Goals - 02/12/18 1337      PT LONG TERM GOAL #1   Title  Patient will perform bed mobility with Supervision to increase independent mobility    Baseline  able to perform mod I for increased time , occasional Min A for scooting up in bed; 7/16: min guard for safety with increased effort and time, cues to scoot back in bed before sit>supine 9/12: can do it independently but with difficulty    Time  8    Period  Weeks    Status  Achieved      PT LONG TERM GOAL #2   Title  Patient (< 45 years old) will complete five times sit to stand test in < 10 seconds indicating an increased LE strength and improved balance.    Baseline  2/18: 30 seconds from plinth chair height single UE on seat and single on walker ; 32 seconds from plinth with single UE on seat and single on walker. 4/10: 26 seconds from [facility] with one hand on walker5/29: 22.2 seconds with one hand on walker one hand on WC; 7/16: one hand on walker one hand on WC 18.96 seconds; pt achieving 75% of full upright standing 8/22:one hand on walker one hand on WC 18 seconds; pt achieving 75% of full upright standing   9/12: 18 seconds econds; pt achieving 75% of full upright standing 8/22:one hand on walker one hand on WC 10/15: 17seconds; pt achieving  75% of full upright posture 11/7: 16 seconds; pt achieving 75 upright posture    Time  8    Period  Weeks    Status  Partially Met    Target Date  04/09/18      PT LONG TERM GOAL #3   Title  Patient will ambulate 40 ft with walker with no breaks to allow for increased mobility within home.     Baseline  12/26: 29 ft with walker and WC follow with no rest breaks; 2/18: multiple trials previous session of >100 ft     Time  8    Period  Weeks    Status  Achieved      PT LONG TERM GOAL #4   Title  Patient will increase BLE gross strength to 4+/5 as to improve functional strength for independent gait, increased standing tolerance and increased ADL ability.    Baseline   4-/5 RLE, 10/15: 4/5 RLE 11/7: 4/5 RLE    Time  8    Period  Weeks    Status  Partially Met    Target Date  04/09/18      PT LONG TERM GOAL #5   Title  Patient will increase lower extremity functional scale to >40/80 to demonstrate improved functional mobility and increased tolerance with ADLs.     Baseline  14/80; 12/26: 21/80; 2/18: 28/80  4/10; 25/80 5/29: 27/80; 7/16: 29/80 8/22: 30/80  9/12: 26/80 (feeling more tired due to moving) 10/15: 37/80 11/7: 23/80    Time  8    Period  Weeks    Status  Partially Met    Target Date  04/09/18      Additional Long Term Goals   Additional Long Term Goals  Yes      PT LONG TERM GOAL #6   Title  Patient will walk through a door involving one small step up/down to increase functional independence.     Baseline  require CGA  BUE support in // bars at this time 10/15: required BUE and CGA in //bars    Time  8    Period  Weeks    Status  Deferred      PT LONG TERM GOAL #7   Title  Patient will ambulate 40 ft with least assistive device and Supervision with no breaks to allow for increased mobility within home.     Baseline  requires use of  walker and CGA     Time  8    Period  Weeks    Status  Achieved      PT LONG TERM GOAL #8   Title  Patient will perform 10 MWT in < 1 minute to demonstrate improved ambulatory velocity and capacity.     Baseline  4/10: 2 minutes 45 seconds; 5/29: 2 minute 31 seconds; 3 minutes 22 seconds 8/22: 3 minutes 2 seconds  9/12:  2 minutes and 43 seconds 10/15: 12mnute 12 seconds 11/7: 1 minute 21 seconds    Time  8    Period  Weeks    Status  Partially Met    Target Date  04/09/18      PT LONG TERM GOAL  #9   TITLE  Patient will increase Berg Balance score by > 6 points (26/56) to demonstrate decreased fall risk during functional activities.    Baseline  9/12: 20/56 10/15: 21/56 11/7: 20/56    Time  8    Period  Weeks    Status  Partially  Met    Target Date  04/09/18      PT LONG TERM GOAL  #10   TITLE  Patient (< 34 years old) will complete five times sit to stand test with full upright posture in < 30 seconds indicating an increased glute activation, LE strength and improved balance.    Baseline  10/15: 37seconds 11/7: 20 seconds    Time  8    Period  Weeks    Status  Achieved      PT LONG TERM GOAL  #11   TITLE  Patient will ambulate 100 ft with least assistive device and Supervision with no breaks to allow for increased mobility within home.     Baseline  10/17: 79f 11/7: 1038f   Time  8    Period  Weeks    Status  Achieved      PT LONG TERM GOAL  #12   TITLE  Patient (< 6085ears old) will complete five times sit to stand test with full upright posture in < 15 seconds indicating increased glute activation, LE strength and improved balance.    Baseline  11/7: 20 seconds    Time  8    Period  Weeks    Status  New    Target Date  04/09/18      PT LONG TERM GOAL  #13   TITLE  Patient will ambulate 150 ft with least assistive device and Supervision with no breaks to allow for increased mobility within home.     Baseline  11/7: 10042f  Time  8    Period  Weeks    Status  New     Target Date  04/09/18            Plan - 03/24/18 1552    Clinical Impression Statement  Patient required max verbal cueing for shifting pelvis for upright posture with ambulation due to fatigue resulting in excessive trunk flexion regression and slight internal rogation of LE's. Patient performed step over the line in // bars with focus on heel strike with more challenge to LLE for heel strike position than RLE. Patient will continue to benefit from skilled physical therapy to improve mobility, strength, balance, and gait mechanics    Clinical Impairments Affecting Rehab Potential  hx of HTN, HLD, CVA, learning disability, Diabetes, brain tumor,     PT Frequency  2x / week    PT Duration  8 weeks    PT Treatment/Interventions  ADLs/Self Care Home Management;Aquatic Therapy;Ultrasound;Moist Heat;Traction;DME Instruction;Gait training;Stair training;Functional mobility training;Therapeutic activities;Therapeutic exercise;Orthotic Fit/Training;Neuromuscular re-education;Balance training;Patient/family education;Manual techniques;Wheelchair mobility training;Passive range of motion;Energy conservation;Taping;Visual/perceptual remediation/compensation    PT Next Visit Plan  balance and stability and ambulation, anterior weight shift hold    PT Home Exercise Plan  TrA contraction     Consulted and Agree with Plan of Care  Patient       Patient will benefit from skilled therapeutic intervention in order to improve the following deficits and impairments:  Abnormal gait, Decreased activity tolerance, Decreased balance, Decreased knowledge of precautions, Decreased endurance, Decreased coordination, Decreased knowledge of use of DME, Decreased mobility, Decreased range of motion, Difficulty walking, Decreased safety awareness, Decreased strength, Impaired flexibility, Impaired perceived functional ability, Impaired tone, Postural dysfunction, Improper body mechanics, Pain  Visit Diagnosis: Muscle  weakness (generalized)  Other lack of coordination  Hemiplegia and hemiparesis following cerebral infarction affecting right dominant side (HCC)  Other abnormalities of gait and mobility  Problem List Patient Active Problem List   Diagnosis Date Noted  . Acute CVA (cerebrovascular accident) ([place]) 05/02/2016  . Left-sided weakness 05/01/2016   Janna Arch, PT, DPT   03/24/2018, 4:08 PM  [place] MAIN [facility] SERVICES [address] [place], [place], [address] Phone: [phone]   Fax:  [phone]  Name: Curtis Powell MRN: [id number] Date of Birth: 10/25/1965

## 2018-03-24 NOTE — Therapy (Signed)
[place] MAIN [facility] SERVICES [address] [place], [place], [address] Phone: [phone]   Fax:  [phone]  Occupational Therapy Treatment  Patient Details  Name: Curtis Powell MRN: [id number] Date of Birth: 04/21/1965 No data recorded  Encounter Date: 03/24/2018  OT End of Session - 03/24/18 1442    Visit Number  22    Number of Visits  33    Date for OT Re-Evaluation  04/08/18    Authorization Type  Visit 6 of 10 for progress reporting period starting 02/26/2018    OT Start Time  1430    OT Stop Time  1515    OT Time Calculation (min)  45 min    Equipment Utilized During Treatment  Long handled shoe horn    Activity Tolerance  Patient tolerated treatment well    Behavior During Therapy  WFL for tasks assessed/performed       Past Medical History:  Diagnosis Date  . GERD (gastroesophageal reflux disease)   . Hyperlipidemia   . Hypertension   . Obesity   . Stroke [facility])     Past Surgical History:  Procedure Laterality Date  . BRAIN SURGERY      There were no vitals filed for this visit.  Subjective Assessment - 03/24/18 1439    Subjective   Pt. reports being tired today.    Pertinent History  Pt. is a 52 y.o. male who suffered a CVA on 05/01/2016. Pt. was admitted to the hospital. Once discharged, he received Home Health PT and OT services for about a month. Pt. has had multiple CVAs over the past 8 years, and has had multiple falls in the past 6 months. Pt. resides in an apartment. Pt. has caregivers for 80 hours. Pt.'s mother stays with pt. at night, and assists with IADL tasks.      Patient Stated Goals  To be able to throw a ball and dribble a ball, do as much as I can for myself.     Currently in Pain?  No/denies      OT TREATMENT    Neuro muscular re-education:  Pt. worked on [place] reaching using the vertical shape tower. Pt. was able to move the shapes through 3  vertical rungs, each with increasing levels of  difficulty.  Pt. was able to complete the task easier with the LUE. Pt. required increased time to complete with the RUE. Pt. worked on grasping, and manipulating 1/2" washers from a magnetic dish using point grasp pattern. Pt. worked on reaching up with the RUE, stabilizing, and sustaining shoulder elevation while placing the washer over a small precise target on vertical dowels positioned at various angles.                        OT Education - 03/24/18 1441    Education provided  Yes    Education Details  RUE Functioning    Person(s) Educated  Patient    Methods  Explanation;Demonstration    Comprehension  Verbalized understanding;Returned demonstration;Verbal cues required       OT Short Term Goals - 03/03/17 1459      OT SHORT TERM GOAL #1   Title  `        OT Long Term Goals - 02/24/18 1445      OT LONG TERM GOAL #2   Title  Pt. will complete self grooming with minA    Baseline  02/24/2018 - Pt is  independent with oral care and washing his face and occasionally requires increased time if he is having a bad day. Pt's shaving is completed by his barber.    Time  12    Period  Weeks    Status  On-going    Target Date  05/19/18      OT LONG TERM GOAL #4   Title  Pt. will perform self dressing with minA and A/E as needed.    Baseline  02/24/2018 - Pt. continues to improve LB dressing techniques while seated. Pt required extensive time and mod A to complete LB dressing. Pt. requires min A for UB dressing.    Time  12    Period  Weeks    Status  On-going    Target Date  05/19/18      OT LONG TERM GOAL #6   Title  Pt. will write his name efficiently with 100% legibility    Baseline  02/24/2018 - Pt to continue to work to increase legibility and efficiency when writing name to sign in when riding the [place]    Time  8    Period  Weeks    Status  On-going    Target Date  04/21/18      OT LONG TERM GOAL #7   Title  Pt will independently and consistently  follow HEP to increase UE strength to increase functional independence    Baseline  02/24/2018 - Pt to continue to work towards independence with exercises    Time  6    Period  Weeks    Status  On-going    Target Date  04/07/18            Plan - 03/24/18 1443    Clinical Impression Statement  Pt. is making staedy progress, however presents with limited RUE functional reaching, and fatigue with reaching during ADL, and IADL tasks. Pt. continues to work on improving RUE functional reaching, strengthening, and [facility] skills in order to increase UE engagement, and participation in ADLs, and IADL tasks.    Occupational performance deficits (Please refer to evaluation for details):  ADL's;IADL's    Rehab Potential  Fair    OT Frequency  2x / week    OT Duration  12 weeks    OT Treatment/Interventions  Self-care/ADL training;Patient/family education;DME and/or AE instruction;Therapeutic exercise;Moist Heat;Neuromuscular education    Clinical Decision Making  Several treatment options, min-mod task modification necessary    Consulted and Agree with Plan of Care  Patient       Patient will benefit from skilled therapeutic intervention in order to improve the following deficits and impairments:  Pain, Decreased cognition, Decreased activity tolerance, Impaired UE functional use, Decreased range of motion, Decreased coordination, Decreased endurance  Visit Diagnosis: Muscle weakness (generalized)  Other lack of coordination    Problem List Patient Active Problem List   Diagnosis Date Noted  . Acute CVA (cerebrovascular accident) ([place]) 05/02/2016  . Left-sided weakness 05/01/2016    Harrel Carina, MS, OTR/L 03/24/2018, 2:57 PM  [place] MAIN [facility] SERVICES [address] [place], [place], [address] Phone: [phone]   Fax:  [phone]  Name: Curtis Powell MRN: [id number] Date of Birth: Nov 30, 1965

## 2018-03-26 ENCOUNTER — Encounter: Payer: Self-pay | Admitting: Occupational Therapy

## 2018-03-26 ENCOUNTER — Ambulatory Visit: Payer: Medicare HMO | Admitting: Occupational Therapy

## 2018-03-26 ENCOUNTER — Ambulatory Visit: Payer: Medicare HMO

## 2018-03-26 DIAGNOSIS — M6281 Muscle weakness (generalized): Secondary | ICD-10-CM

## 2018-03-26 DIAGNOSIS — R2689 Other abnormalities of gait and mobility: Secondary | ICD-10-CM

## 2018-03-26 DIAGNOSIS — I69351 Hemiplegia and hemiparesis following cerebral infarction affecting right dominant side: Secondary | ICD-10-CM

## 2018-03-26 DIAGNOSIS — R278 Other lack of coordination: Secondary | ICD-10-CM

## 2018-03-26 NOTE — Therapy (Signed)
[place] MAIN [facility] SERVICES [address] [place], [place], [address] Phone: [phone]   Fax:  [phone]  Physical Therapy Treatment Physical Therapy Progress Note   Dates of reporting period  02/12/18   to   03/26/18   Patient Details  Name: Curtis Powell MRN: [id number] Date of Birth: [birth year] No data recorded  Encounter Date: 03/26/2018  PT End of Session - 03/26/18 1442    Visit Number  88    Number of Visits  72    Date for PT Re-Evaluation  04/09/18    Authorization Type  PN 10/10 starts 11/7) (next one 1/10 starting 12/19)     PT Start Time  1516    PT Stop Time  1600    PT Time Calculation (min)  44 min    Equipment Utilized During Treatment  Gait belt    Activity Tolerance  Patient tolerated treatment well    Behavior During Therapy  WFL for tasks assessed/performed       Past Medical History:  Diagnosis Date  . GERD (gastroesophageal reflux disease)   . Hyperlipidemia   . Hypertension   . Obesity   . Stroke [facility])     Past Surgical History:  Procedure Laterality Date  . BRAIN SURGERY      There were no vitals filed for this visit.  Subjective Assessment - 03/26/18 1556    Subjective  Patient reports he was able to use the restroom at home with Mod I rather than CGA per usual. Reports compliance with HEP. no falls since last session. No pain.     Pertinent History   Patient is a pleasant 52 year old male who presents to physical therapy for weakness and immobility secondary to CVA.  Had a stroke in Feb 2018. He was previously fully independent, but this stroke caused severe residual deficits, mainly on the right side as well as speech, and now he is unable to walk or perform most of his ADLs on his own. Entire right side is very weak. He still has a little difficulty with speech but his swallowing is improved to baseline. His mom had to move in with him and now is his main caregiver.     Limitations   Sitting;Lifting;Standing;Walking;House hold activities    How long can you sit comfortably?  5 minutes    How long can you stand comfortably?  5 minutes    How long can you walk comfortably?  10 ft    Patient Stated Goals  Walk without walker, walk further with walker, get some strength in back     Currently in Pain?  No/denies        [facility] PT Assessment - 03/26/18 0001      Berg Balance Test   Sit to Stand  Able to stand  independently using hands    Standing Unsupported  Able to stand 30 seconds unsupported    Sitting with Back Unsupported but Feet Supported on Floor or Stool  Able to sit safely and securely 2 minutes    Stand to Sit  Controls descent by using hands    Transfers  Able to transfer with verbal cueing and /or supervision    Standing Unsupported with Eyes Closed  Able to stand 10 seconds with supervision    Standing Ubsupported with Feet Together  Needs help to attain position but able to stand for 30 seconds with feet together    From Standing, Reach Forward with  Outstretched Arm  Reaches forward but needs supervision    From Standing Position, Pick up Object from Floor  Unable to pick up and needs supervision    From Standing Position, Turn to Look Behind Over each Shoulder  Needs supervision when turning    Turn 360 Degrees  Needs assistance while turning    Standing Unsupported, Alternately Place Feet on Step/Stool  Able to complete >2 steps/needs minimal assist    Standing Unsupported, One Foot in [employer] balance while stepping or standing    Standing on One Leg  Unable to try or needs assist to prevent fall    Total Score  22        PROGRESS NOTE 5x STS: 15 seconds with one hand on wC one hand on walker, partial hip flexion noted (not full upright positioning) : 18 seconds for full right upright posture/position   10 MWT: 1 minute 16 seconds BERG: 22/56 ; frequent seated breaks between tasks Ambulate 150 ft  : 115 ft    RWand CGA with WC follow, able  to progress gait distance with no standing or sitting rest break;Verbaland manualcues foranteriorweight shiftprior to each step with glute activation and upright posture. Verbal cues for heel contact for each step. Tactile cues for gluteal activation to improve step lengthand RLE stance time.Patient haslimitedvelocity resulting in prolonged timed needed for distance ambulation.    5lb ankle weights: back away from back of chair for trunk stability challenge             Marching 15x each LE             Long arc quad 5 second holds, 10x    Patient's condition has the potential to improve in response to therapy. Maximum improvement is yet to be obtained. The anticipated improvement is attainable and reasonable in a generally predictable time.  Patient reports that he was able to use the rest room with Mod I for the first time with his caregiver the other day, normally requires CGA.                     PT Education - 03/26/18 1557    Education provided  Yes    Education Details  GOALS, exercise technique, ambulation     Person(s) Educated  Patient    Methods  Explanation;Demonstration;Tactile cues;Verbal cues    Comprehension  Verbalized understanding;Returned demonstration;Tactile cues required;Need further instruction;Verbal cues required       PT Short Term Goals - 03/26/18 1553      PT SHORT TERM GOAL #1   Title  Patient will be independent in home exercise program to improve strength/mobility for better functional independence with ADLs.    Baseline  hep compliant    Time  2    Period  Weeks    Status  Achieved      PT SHORT TERM GOAL #2   Title  Patient will require min cueing for STS transfer with CGA for increased independence with mobility.     Baseline  CGA    Time  2    Period  Weeks    Status  Achieved      PT SHORT TERM GOAL #3   Title  Patient will maintain upright posture for > 15 seconds to demonstrate strengthened postural control  muscualture     Baseline  18 seconds     Time  2    Period  Weeks    Status  Achieved        PT Long Term Goals - 03/26/18 1553      PT LONG TERM GOAL #1   Title  Patient will perform bed mobility with Supervision to increase independent mobility    Baseline  able to perform mod I for increased time , occasional Min A for scooting up in bed; 7/16: min guard for safety with increased effort and time, cues to scoot back in bed before sit>supine 9/12: can do it independently but with difficulty    Time  8    Period  Weeks    Status  Achieved      PT LONG TERM GOAL #2   Title  Patient (< 65 years old) will complete five times sit to stand test in < 10 seconds indicating an increased LE strength and improved balance.    Baseline  2/18: 30 seconds from plinth chair height single UE on seat and single on walker ; 32 seconds from plinth with single UE on seat and single on walker. 4/10: 26 seconds from [facility] with one hand on walker5/29: 22.2 seconds with one hand on walker one hand on WC; 7/16: one hand on walker one hand on WC 18.96 seconds; pt achieving 75% of full upright standing 8/22:one hand on walker one hand on WC 18 seconds; pt achieving 75% of full upright standing  9/12: 18 seconds econds; pt achieving 75% of full upright standing 8/22:one hand on walker one hand on WC 10/15: 17seconds; pt achieving 75% of full upright posture 11/7: 16 seconds; pt achieving 75 upright posture 12/19: 15 seconds 80% upright posture    Time  8    Period  Weeks    Status  Partially Met    Target Date  04/09/18      PT LONG TERM GOAL #3   Title  Patient will ambulate 40 ft with walker with no breaks to allow for increased mobility within home.     Baseline  12/26: 29 ft with walker and WC follow with no rest breaks; 2/18: multiple trials previous session of >100 ft     Time  8    Period  Weeks    Status  Achieved      PT LONG TERM GOAL #4   Title  Patient will increase BLE gross strength to 4+/5 as to  improve functional strength for independent gait, increased standing tolerance and increased ADL ability.    Baseline   4-/5 RLE, 10/15: 4/5 RLE 11/7: 4/5 RLE    Time  8    Period  Weeks    Status  Partially Met    Target Date  04/09/18      PT LONG TERM GOAL #5   Title  Patient will increase lower extremity functional scale to >40/80 to demonstrate improved functional mobility and increased tolerance with ADLs.     Baseline  14/80; 12/26: 21/80; 2/18: 28/80  4/10; 25/80 5/29: 27/80; 7/16: 29/80 8/22: 30/80  9/12: 26/80 (feeling more tired due to moving) 10/15: 37/80 11/7: 23/80    Time  8    Period  Weeks    Status  Partially Met    Target Date  04/09/18      PT LONG TERM GOAL #6   Title  Patient will walk through a door involving one small step up/down to increase functional independence.     Baseline  require CGA  BUE support in // bars at this time 10/15: required BUE and  CGA in //bars    Time  8    Period  Weeks    Status  Deferred      PT LONG TERM GOAL #7   Title  Patient will ambulate 40 ft with least assistive device and Supervision with no breaks to allow for increased mobility within home.     Baseline  requires use of walker and CGA     Time  8    Period  Weeks    Status  Achieved      PT LONG TERM GOAL #8   Title  Patient will perform 10 MWT in < 1 minute to demonstrate improved ambulatory velocity and capacity.     Baseline  4/10: 2 minutes 45 seconds; 5/29: 2 minute 31 seconds; 3 minutes 22 seconds 8/22: 3 minutes 2 seconds  9/12:  2 minutes and 43 seconds 10/15: 35mnute 12 seconds 11/7: 1 minute 21 seconds 12/19: 1 min 16 seconds     Time  8    Period  Weeks    Status  Partially Met    Target Date  04/09/18      PT LONG TERM GOAL  #9   TITLE  Patient will increase Berg Balance score by > 6 points (26/56) to demonstrate decreased fall risk during functional activities.    Baseline  9/12: 20/56 10/15: 21/56 11/7: 20/56 12/19: 22/56     Time  8    Period  Weeks     Status  Partially Met    Target Date  04/09/18      PT LONG TERM GOAL  #10   TITLE  Patient (< 762years old) will complete five times sit to stand test with full upright posture in < 30 seconds indicating an increased glute activation, LE strength and improved balance.    Baseline  10/15: 37seconds 11/7: 20 seconds     Time  8    Period  Weeks    Status  Achieved      PT LONG TERM GOAL  #11   TITLE  Patient will ambulate 100 ft with least assistive device and Supervision with no breaks to allow for increased mobility within home.     Baseline  10/17: 828f11/7: [id number]  Time  8    Period  Weeks    Status  Achieved      PT LONG TERM GOAL  #12   TITLE  Patient (< 60 47ars old) will complete five times sit to stand test with full upright posture in < 15 seconds indicating increased glute activation, LE strength and improved balance.    Baseline  11/7: 20 seconds 12/19: 18 seconds     Time  8    Period  Weeks    Status  Partially Met    Target Date  04/09/18      PT LONG TERM GOAL  #13   TITLE  Patient will ambulate 150 ft with least assistive device and Supervision with no breaks to allow for increased mobility within home.     Baseline  11/7: [id number]: 115 ft     Time  8    Period  Weeks    Status  Partially Met    Target Date  04/09/18            Plan - 03/26/18 1625    Clinical Impression Statement  Patient is demonstrating progression with functional ambulation with improved velocity for short duration ambulation and improved  duration of ambulation for long distance ambulation. Increased foot clearance is noted however patient does fatigue resulting in return to compensatory patterning. Patient's condition has the potential to improve in response to therapy. Maximum improvement is yet to be obtained. The anticipated improvement is attainable and reasonable in a generally predictable time. Patient will continue to benefit from skilled physical therapy to improve  mobility, strength, balance, and gait mechanics    Clinical Impairments Affecting Rehab Potential  hx of HTN, HLD, CVA, learning disability, Diabetes, brain tumor,     PT Frequency  2x / week    PT Duration  8 weeks    PT Treatment/Interventions  ADLs/Self Care Home Management;Aquatic Therapy;Ultrasound;Moist Heat;Traction;DME Instruction;Gait training;Stair training;Functional mobility training;Therapeutic activities;Therapeutic exercise;Orthotic Fit/Training;Neuromuscular re-education;Balance training;Patient/family education;Manual techniques;Wheelchair mobility training;Passive range of motion;Energy conservation;Taping;Visual/perceptual remediation/compensation    PT Next Visit Plan  balance and stability and ambulation, anterior weight shift hold    PT Home Exercise Plan  TrA contraction     Consulted and Agree with Plan of Care  Patient       Patient will benefit from skilled therapeutic intervention in order to improve the following deficits and impairments:  Abnormal gait, Decreased activity tolerance, Decreased balance, Decreased knowledge of precautions, Decreased endurance, Decreased coordination, Decreased knowledge of use of DME, Decreased mobility, Decreased range of motion, Difficulty walking, Decreased safety awareness, Decreased strength, Impaired flexibility, Impaired perceived functional ability, Impaired tone, Postural dysfunction, Improper body mechanics, Pain  Visit Diagnosis: Muscle weakness (generalized)  Other lack of coordination  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 Acute CVA (cerebrovascular accident) ([place]) 05/02/2016  . Left-sided weakness 05/01/2016   Janna Arch, PT, DPT    03/26/2018, 4:26 PM  [place] MAIN [facility] SERVICES [address]  [place], [place], [address] Phone: [phone]   Fax:  [phone]  Name: Curtis Powell MRN: [id number] Date of Birth: [birth year]

## 2018-03-26 NOTE — Therapy (Addendum)
[place] MAIN [facility] SERVICES [address] [place], [place], [address] Phone: [phone]   Fax:  [phone]  Occupational Therapy Treatment  Patient Details  Name: Curtis Powell MRN: [id number] Date of Birth: 26-Apr-1965 No data recorded  Encounter Date: 03/26/2018  OT End of Session - 03/26/18 1442    Visit Number  74    Number of Visits  54    Date for OT Re-Evaluation  04/08/18    Authorization Type  Visit 7 of 10 for progress reporting period starting 02/26/2018    OT Start Time  1430    OT Stop Time  1515    OT Time Calculation (min)  45 min    Equipment Utilized During Treatment  Long handled shoe horn    Activity Tolerance  Patient tolerated treatment well    Behavior During Therapy  WFL for tasks assessed/performed       Past Medical History:  Diagnosis Date  . GERD (gastroesophageal reflux disease)   . Hyperlipidemia   . Hypertension   . Obesity   . Stroke [facility])     Past Surgical History:  Procedure Laterality Date  . BRAIN SURGERY      There were no vitals filed for this visit.  Subjective Assessment - 03/26/18 1437    Subjective   Pt. reports being ready for Christmas.     Pertinent History  Pt. is a 52 y.o. male who suffered a CVA on 05/01/2016. Pt. was admitted to the hospital. Once discharged, he received Home Health PT and OT services for about a month. Pt. has had multiple CVAs over the past 8 years, and has had multiple falls in the past 6 months. Pt. resides in an apartment. Pt. has caregivers for 80 hours. Pt.'s mother stays with pt. at night, and assists with IADL tasks.      Patient Stated Goals  To be able to throw a ball and dribble a ball, do as much as I can for myself.     Currently in Pain?  No/denies      OT TREATMENT    Neuro muscular re-education:  Pt. worked on functional reaching overhead with bilateral UE's using the shape tower moving the shapes through 3 vertical rungs, each of  progressively increasing height. Pt. with decreased lateral leaning when reaching to the right. Pt. worked on [facility] grasping 1/2" small magnetic pegs, disconnecting them, and placing them on a positioned at a vertical angle at a tabletop, on an elevated angle to encourage [facility] skills with arms sustained in elevation. Pt. Required several rest breaks.                         OT Education - 03/26/18 1440    Education provided  Yes    Education Details  RUE Functioning    Person(s) Educated  Patient    Methods  Explanation;Demonstration    Comprehension  Verbalized understanding;Returned demonstration;Verbal cues required       OT Short Term Goals - 03/03/17 1459      OT SHORT TERM GOAL #1   Title  `        OT Long Term Goals - 02/24/18 1445      OT LONG TERM GOAL #2   Title  Pt. will complete self grooming with minA    Baseline  02/24/2018 - Pt is independent with oral care and washing his face and occasionally requires increased time if  he is having a bad day. Pt's shaving is completed by his barber.    Time  12    Period  Weeks    Status  On-going    Target Date  05/19/18      OT LONG TERM GOAL #4   Title  Pt. will perform self dressing with minA and A/E as needed.    Baseline  02/24/2018 - Pt. continues to improve LB dressing techniques while seated. Pt required extensive time and mod A to complete LB dressing. Pt. requires min A for UB dressing.    Time  12    Period  Weeks    Status  On-going    Target Date  05/19/18      OT LONG TERM GOAL #6   Title  Pt. will write his name efficiently with 100% legibility    Baseline  02/24/2018 - Pt to continue to work to increase legibility and efficiency when writing name to sign in when riding the [place]    Time  8    Period  Weeks    Status  On-going    Target Date  04/21/18      OT LONG TERM GOAL #7   Title  Pt will independently and consistently follow HEP to increase UE strength to increase functional  independence    Baseline  02/24/2018 - Pt to continue to work towards independence with exercises    Time  6    Period  Weeks    Status  On-going    Target Date  04/07/18            Plan - 03/26/18 1443    Clinical Impression:         Occupational performance deficits (Please refer to evaluation for details): Pt. continues to make steady progress, however presents with limited RUE ROM needed for functional reaching. Pt. continues to require rest breaks secondary to fatigue when reaching up during ADL, and IADL tasks. Pt. continues to work on improving RUE functional reaching, strengthening, and [facility] skills in order to be able to maximize independence during ADLS, and improve his ability reach up during ADLs, and IADLs.  ADL's;IADL's    Rehab Potential  Fair    OT Frequency  2x / week    OT Duration  12 weeks    OT Treatment/Interventions  Self-care/ADL training;Patient/family education;DME and/or AE instruction;Therapeutic exercise;Moist Heat;Neuromuscular education    Clinical Decision Making  Several treatment options, min-mod task modification necessary    Consulted and Agree with Plan of Care  Patient       Patient will benefit from skilled therapeutic intervention in order to improve the following deficits and impairments:  Pain, Decreased cognition, Decreased activity tolerance, Impaired UE functional use, Decreased range of motion, Decreased coordination, Decreased endurance  Visit Diagnosis: Muscle weakness (generalized)  Other lack of coordination    Problem List Patient Active Problem List   Diagnosis Date Noted  . Acute CVA (cerebrovascular accident) ([place]) 05/02/2016  . Left-sided weakness 05/01/2016    Harrel Carina, MS, OTR/L 03/26/2018, 2:46 PM  [place] MAIN [facility] SERVICES [address] [place], [place], [address] Phone: [phone]   Fax:  [phone]  Name: Curtis Powell MRN: [id number] Date of  Birth: [birth year]

## 2018-04-02 ENCOUNTER — Encounter: Payer: Self-pay | Admitting: Occupational Therapy

## 2018-04-02 ENCOUNTER — Ambulatory Visit: Payer: Medicare HMO | Admitting: Physical Therapy

## 2018-04-02 ENCOUNTER — Encounter: Payer: Self-pay | Admitting: Physical Therapy

## 2018-04-02 ENCOUNTER — Ambulatory Visit: Payer: Medicare HMO | Admitting: Occupational Therapy

## 2018-04-02 DIAGNOSIS — R278 Other lack of coordination: Secondary | ICD-10-CM

## 2018-04-02 DIAGNOSIS — M6281 Muscle weakness (generalized): Secondary | ICD-10-CM

## 2018-04-02 DIAGNOSIS — I69351 Hemiplegia and hemiparesis following cerebral infarction affecting right dominant side: Secondary | ICD-10-CM

## 2018-04-02 DIAGNOSIS — R2689 Other abnormalities of gait and mobility: Secondary | ICD-10-CM

## 2018-04-02 NOTE — Therapy (Signed)
[place] MAIN [facility] SERVICES [address] [place], [place], [address] Phone: [phone]   Fax:  [phone]  Occupational Therapy Treatment  Patient Details  Name: Curtis Powell MRN: [id number] Date of Birth: 1966-03-31 No data recorded  Encounter Date: 04/02/2018  OT End of Session - 04/02/18 1306    Visit Number  23    Number of Visits  64    Date for OT Re-Evaluation  04/08/18    Authorization Type  Visit 8 of 10 for progress reporting period starting 02/26/2018    OT Start Time  1300    OT Stop Time  1345    OT Time Calculation (min)  45 min    Activity Tolerance  Patient tolerated treatment well    Behavior During Therapy  [facility] for tasks assessed/performed       Past Medical History:  Diagnosis Date  . GERD (gastroesophageal reflux disease)   . Hyperlipidemia   . Hypertension   . Obesity   . Stroke [facility])     Past Surgical History:  Procedure Laterality Date  . BRAIN SURGERY      There were no vitals filed for this visit.  Subjective Assessment - 04/02/18 1305    Subjective   Pt. reports being ready for Christmas.     Pertinent History  Pt. is a 52 y.o. male who suffered a CVA on 05/01/2016. Pt. was admitted to the hospital. Once discharged, he received Home Health PT and OT services for about a month. Pt. has had multiple CVAs over the past 8 years, and has had multiple falls in the past 6 months. Pt. resides in an apartment. Pt. has caregivers for 80 hours. Pt.'s mother stays with pt. at night, and assists with IADL tasks.      Patient Stated Goals  To be able to throw a ball and dribble a ball, do as much as I can for myself.     Currently in Pain?  No/denies       OT TREATMENT    Neuro muscular re-education:  Pt. worked on functional reaching overhead with bilateral UE's using the shape tower moving the shapes through 3 vertical rungs, each of progressively increasing height. Pt. with decreased lateral leaning  when reaching to the right.   Self-care:  Pt. worked on Building surveyor, and Media planner. Pt. requires increased time to complete. Pt. presents with large printed text, 100% legible, cursive writing with 50% legibility.                        OT Education - 04/02/18 1306    Education provided  Yes    Education Details  RUE Functioning    Person(s) Educated  Patient    Methods  Explanation;Demonstration    Comprehension  Verbalized understanding;Returned demonstration;Verbal cues required       OT Short Term Goals - 03/03/17 1459      OT SHORT TERM GOAL #1   Title  `        OT Long Term Goals - 02/24/18 1445      OT LONG TERM GOAL #2   Title  Pt. will complete self grooming with minA    Baseline  02/24/2018 - Pt is independent with oral care and washing his face and occasionally requires increased time if he is having a bad day. Pt's shaving is completed by his barber.    Time  12    Period  Weeks    Status  On-going    Target Date  05/19/18      OT LONG TERM GOAL #4   Title  Pt. will perform self dressing with minA and A/E as needed.    Baseline  02/24/2018 - Pt. continues to improve LB dressing techniques while seated. Pt required extensive time and mod A to complete LB dressing. Pt. requires min A for UB dressing.    Time  12    Period  Weeks    Status  On-going    Target Date  05/19/18      OT LONG TERM GOAL #6   Title  Pt. will write his name efficiently with 100% legibility    Baseline  02/24/2018 - Pt to continue to work to increase legibility and efficiency when writing name to sign in when riding the [place]    Time  8    Period  Weeks    Status  On-going    Target Date  04/21/18      OT LONG TERM GOAL #7   Title  Pt will independently and consistently follow HEP to increase UE strength to increase functional independence    Baseline  02/24/2018 - Pt to continue to work towards independence with exercises    Time  6    Period   Weeks    Status  On-going    Target Date  04/07/18            Plan - 04/02/18 1306    Clinical Impression Statement Pt. continues to work on improving UE strength, and [facility] skills. Pt. continues to present with limited RUE ROM for functional reaching, RUE strength, and limited [facility] skills. Pt. Presents with limited writing legibility, and speed.  Pt. continues to work on improving RUE strength, [facility] skills, and functional reaching for improved independence with ADLS, and IADLs.   Occupational performance deficits (Please refer to evaluation for details):  ADL's;IADL's    Rehab Potential  Fair    OT Frequency  2x / week    OT Duration  12 weeks    OT Treatment/Interventions  Self-care/ADL training;Patient/family education;DME and/or AE instruction;Therapeutic exercise;Moist Heat;Neuromuscular education    Clinical Decision Making  Several treatment options, min-mod task modification necessary    Consulted and Agree with Plan of Care  Patient       Patient will benefit from skilled therapeutic intervention in order to improve the following deficits and impairments:  Pain, Decreased cognition, Decreased activity tolerance, Impaired UE functional use, Decreased range of motion, Decreased coordination, Decreased endurance  Visit Diagnosis: Muscle weakness (generalized)  Other lack of coordination    Problem List Patient Active Problem List   Diagnosis Date Noted  . Acute CVA (cerebrovascular accident) ([place]) 05/02/2016  . Left-sided weakness 05/01/2016    Harrel Carina, MS, OTR/L 04/02/2018, 1:24 PM  [place] MAIN [facility] SERVICES [address] [place], [place], [address] Phone: [phone]   Fax:  [phone]  Name: Curtis Powell MRN: [id number] Date of Birth: 1965/05/11

## 2018-04-03 NOTE — Therapy (Signed)
[place] MAIN [facility] SERVICES [address] [place], [place], [address] Phone: [phone]   Fax:  [phone]  Physical Therapy Treatment  Patient Details  Name: Curtis Powell MRN: [id number] Date of Birth: 1965/10/12 No data recorded  Encounter Date: 04/02/2018  PT End of Session - 04/03/18 0856    Visit Number  66    Number of Visits  99    Date for PT Re-Evaluation  04/09/18    Authorization Type  PN 10/10 starts 11/7) (next one 2/10 starting 12/19)     PT Start Time  1350    PT Stop Time  1430    PT Time Calculation (min)  40 min    Equipment Utilized During Treatment  Gait belt    Activity Tolerance  Patient tolerated treatment well    Behavior During Therapy  WFL for tasks assessed/performed       Past Medical History:  Diagnosis Date  . GERD (gastroesophageal reflux disease)   . Hyperlipidemia   . Hypertension   . Obesity   . Stroke [facility])     Past Surgical History:  Procedure Laterality Date  . BRAIN SURGERY      There were no vitals filed for this visit.  Subjective Assessment - 04/03/18 0854    Subjective  Patient states that he had a nice holiday. Patient reports no significant changes nor falls since his last visit and denies any pain.    Pertinent History   Patient is a pleasant 52 year old male who presents to physical therapy for weakness and immobility secondary to CVA.  Had a stroke in Feb 2018. He was previously fully independent, but this stroke caused severe residual deficits, mainly on the right side as well as speech, and now he is unable to walk or perform most of his ADLs on his own. Entire right side is very weak. He still has a little difficulty with speech but his swallowing is improved to baseline. His mom had to move in with him and now is his main caregiver.     Limitations  Sitting;Lifting;Standing;Walking;House hold activities    How long can you sit comfortably?  5 minutes    How long can you  stand comfortably?  5 minutes    How long can you walk comfortably?  10 ft    Patient Stated Goals  Walk without walker, walk further with walker, get some strength in back     Currently in Pain?  No/denies       TREATMENT   // bars:  Ambulation/retroambulation x2 laps with seated rest between reps. BUE support. CGA/Min A for maintaining pelvic and trunk position particularly for retro ambulation. VCs and TCs provided for gluteal activation.  Standing marches. 2.5# AW; Verbal cues to bring knee towards ceiling and landing with heel. x15 each LE BUE CGA. Standing forward weight shift. 2.5# AW. VCs and TCs provided for contralateral pelvic and hip stabilization.   Toileting: Standing tolerance, SUE support, CGA x5 min while engaged in toileting ADLs. No unsteadiness noted. Patient unable to initiate stream with auditory cues (running water), and reported that sometimes he has the urge to urinate but is unable.   Seated:  Seated alternating ball kick (LAQ) s for coordination and reaction time x15 each leg.  Seated soccer ball pass (stop ball, pass to opposite LE, and return to PT) x10 each leg. Patient able to improve coordination and accuracy of movement for last 7 reps on LLE  and last 5 reps on RLE.  5lb ankle weights: back away from back of chair for trunk stability challenge             Marching 15x each LE             Long arc quad 5 second holds, 15x  BLE Seated GTB aBduction 20x with emphasis on eccentric control/ slow return.   PT Education - 04/03/18 0855    Education provided  Yes    Education Details  exercise technique, ambulation    Person(s) Educated  Patient    Methods  Explanation;Demonstration;Tactile cues;Verbal cues    Comprehension  Verbalized understanding;Need further instruction;Returned demonstration;Verbal cues required;Tactile cues required       PT Short Term Goals - 03/26/18 1553      PT SHORT TERM GOAL #1   Title  Patient will be independent in home  exercise program to improve strength/mobility for better functional independence with ADLs.    Baseline  hep compliant    Time  2    Period  Weeks    Status  Achieved      PT SHORT TERM GOAL #2   Title  Patient will require min cueing for STS transfer with CGA for increased independence with mobility.     Baseline  CGA    Time  2    Period  Weeks    Status  Achieved      PT SHORT TERM GOAL #3   Title  Patient will maintain upright posture for > 15 seconds to demonstrate strengthened postural control muscualture     Baseline  18 seconds     Time  2    Period  Weeks    Status  Achieved        PT Long Term Goals - 03/26/18 1553      PT LONG TERM GOAL #1   Title  Patient will perform bed mobility with Supervision to increase independent mobility    Baseline  able to perform mod I for increased time , occasional Min A for scooting up in bed; 7/16: min guard for safety with increased effort and time, cues to scoot back in bed before sit>supine 9/12: can do it independently but with difficulty    Time  8    Period  Weeks    Status  Achieved      PT LONG TERM GOAL #2   Title  Patient (< 96 years old) will complete five times sit to stand test in < 10 seconds indicating an increased LE strength and improved balance.    Baseline  2/18: 30 seconds from plinth chair height single UE on seat and single on walker ; 32 seconds from plinth with single UE on seat and single on walker. 4/10: 26 seconds from [facility] with one hand on walker5/29: 22.2 seconds with one hand on walker one hand on WC; 7/16: one hand on walker one hand on WC 18.96 seconds; pt achieving 75% of full upright standing 8/22:one hand on walker one hand on WC 18 seconds; pt achieving 75% of full upright standing  9/12: 18 seconds econds; pt achieving 75% of full upright standing 8/22:one hand on walker one hand on WC 10/15: 17seconds; pt achieving 75% of full upright posture 11/7: 16 seconds; pt achieving 75 upright posture 12/19: 15  seconds 80% upright posture    Time  8    Period  Weeks    Status  Partially Met  Target Date  04/09/18      PT LONG TERM GOAL #3   Title  Patient will ambulate 40 ft with walker with no breaks to allow for increased mobility within home.     Baseline  12/26: 29 ft with walker and WC follow with no rest breaks; 2/18: multiple trials previous session of >100 ft     Time  8    Period  Weeks    Status  Achieved      PT LONG TERM GOAL #4   Title  Patient will increase BLE gross strength to 4+/5 as to improve functional strength for independent gait, increased standing tolerance and increased ADL ability.    Baseline   4-/5 RLE, 10/15: 4/5 RLE 11/7: 4/5 RLE    Time  8    Period  Weeks    Status  Partially Met    Target Date  04/09/18      PT LONG TERM GOAL #5   Title  Patient will increase lower extremity functional scale to >40/80 to demonstrate improved functional mobility and increased tolerance with ADLs.     Baseline  14/80; 12/26: 21/80; 2/18: 28/80  4/10; 25/80 5/29: 27/80; 7/16: 29/80 8/22: 30/80  9/12: 26/80 (feeling more tired due to moving) 10/15: 37/80 11/7: 23/80    Time  8    Period  Weeks    Status  Partially Met    Target Date  04/09/18      PT LONG TERM GOAL #6   Title  Patient will walk through a door involving one small step up/down to increase functional independence.     Baseline  require CGA  BUE support in // bars at this time 10/15: required BUE and CGA in //bars    Time  8    Period  Weeks    Status  Deferred      PT LONG TERM GOAL #7   Title  Patient will ambulate 40 ft with least assistive device and Supervision with no breaks to allow for increased mobility within home.     Baseline  requires use of walker and CGA     Time  8    Period  Weeks    Status  Achieved      PT LONG TERM GOAL #8   Title  Patient will perform 10 MWT in < 1 minute to demonstrate improved ambulatory velocity and capacity.     Baseline  4/10: 2 minutes 45 seconds; 5/29: 2  minute 31 seconds; 3 minutes 22 seconds 8/22: 3 minutes 2 seconds  9/12:  2 minutes and 43 seconds 10/15: 31mnute 12 seconds 11/7: 1 minute 21 seconds 12/19: 1 min 16 seconds     Time  8    Period  Weeks    Status  Partially Met    Target Date  04/09/18      PT LONG TERM GOAL  #9   TITLE  Patient will increase Berg Balance score by > 6 points (26/56) to demonstrate decreased fall risk during functional activities.    Baseline  9/12: 20/56 10/15: 21/56 11/7: 20/56 12/19: 22/56     Time  8    Period  Weeks    Status  Partially Met    Target Date  04/09/18      PT LONG TERM GOAL  #10   TITLE  Patient (< 64years old) will complete five times sit to stand test with full upright posture in < 30  seconds indicating an increased glute activation, LE strength and improved balance.    Baseline  10/15: 37seconds 11/7: 20 seconds     Time  8    Period  Weeks    Status  Achieved      PT LONG TERM GOAL  #11   TITLE  Patient will ambulate 100 ft with least assistive device and Supervision with no breaks to allow for increased mobility within home.     Baseline  10/17: 59f 11/7: 1035f   Time  8    Period  Weeks    Status  Achieved      PT LONG TERM GOAL  #12   TITLE  Patient (< 6044ears old) will complete five times sit to stand test with full upright posture in < 15 seconds indicating increased glute activation, LE strength and improved balance.    Baseline  11/7: 20 seconds 12/19: 18 seconds     Time  8    Period  Weeks    Status  Partially Met    Target Date  04/09/18      PT LONG TERM GOAL  #13   TITLE  Patient will ambulate 150 ft with least assistive device and Supervision with no breaks to allow for increased mobility within home.     Baseline  11/7: 10047f2/19: 115 ft     Time  8    Period  Weeks    Status  Partially Met    Target Date  04/09/18            Plan - 04/03/18 0857    Clinical Impression Statement  Patient presents to clinic with excellent motivation for  therapy. Patient continues to require verbal and tactile cues for stride length, knee flexion, and erect posture during gait, but is able to make/attempt adjustments immediately although limited by strength and activity tolerance. Patient demonstrated increased BLE coordination with increased repetitions during seated soccer tasks involving passing between limbs before passing to a target. Patient also demonstrated excellent trunk and BLE motor control and eccentric strength when performing stand to sit transfers with BUE support. Patient will continue to benefit from skilled therapeutic intervention to address deficits in mobility, stregth, balance, and mobility in order to improve overall QOL and function.    Clinical Impairments Affecting Rehab Potential  hx of HTN, HLD, CVA, learning disability, Diabetes, brain tumor,     PT Frequency  2x / week    PT Duration  8 weeks    PT Treatment/Interventions  ADLs/Self Care Home Management;Aquatic Therapy;Ultrasound;Moist Heat;Traction;DME Instruction;Gait training;Stair training;Functional mobility training;Therapeutic activities;Therapeutic exercise;Orthotic Fit/Training;Neuromuscular re-education;Balance training;Patient/family education;Manual techniques;Wheelchair mobility training;Passive range of motion;Energy conservation;Taping;Visual/perceptual remediation/compensation    PT Next Visit Plan  balance and stability and ambulation, anterior weight shift hold    PT Home Exercise Plan  TrA contraction     Consulted and Agree with Plan of Care  Patient       Patient will benefit from skilled therapeutic intervention in order to improve the following deficits and impairments:  Abnormal gait, Decreased activity tolerance, Decreased balance, Decreased knowledge of precautions, Decreased endurance, Decreased coordination, Decreased knowledge of use of DME, Decreased mobility, Decreased range of motion, Difficulty walking, Decreased safety awareness, Decreased  strength, Impaired flexibility, Impaired perceived functional ability, Impaired tone, Postural dysfunction, Improper body mechanics, Pain  Visit Diagnosis: Muscle weakness (generalized)  Other lack of coordination  Hemiplegia and hemiparesis following cerebral infarction affecting right dominant side (HCC)  Other abnormalities of  gait and mobility     Problem List Patient Active Problem List   Diagnosis Date Noted  . Acute CVA (cerebrovascular accident) ([place]) 05/02/2016  . Left-sided weakness 05/01/2016   Myles Gip PT, DPT [phone] 04/03/2018, 9:05 AM  [place] MAIN [facility] SERVICES [address] [place], [place], [address] Phone: [phone]   Fax:  [phone]  Name: AAHIL FREDIN MRN: [id number] Date of Birth: 1966-01-03

## 2018-04-07 ENCOUNTER — Ambulatory Visit: Payer: Medicare HMO | Admitting: Occupational Therapy

## 2018-04-07 ENCOUNTER — Encounter: Payer: Self-pay | Admitting: Physical Therapy

## 2018-04-07 ENCOUNTER — Ambulatory Visit: Payer: Medicare HMO | Admitting: Physical Therapy

## 2018-04-07 ENCOUNTER — Encounter: Payer: Self-pay | Admitting: Occupational Therapy

## 2018-04-07 DIAGNOSIS — R278 Other lack of coordination: Secondary | ICD-10-CM

## 2018-04-07 DIAGNOSIS — M6281 Muscle weakness (generalized): Secondary | ICD-10-CM

## 2018-04-07 DIAGNOSIS — I69351 Hemiplegia and hemiparesis following cerebral infarction affecting right dominant side: Secondary | ICD-10-CM

## 2018-04-07 DIAGNOSIS — R2689 Other abnormalities of gait and mobility: Secondary | ICD-10-CM

## 2018-04-07 NOTE — Therapy (Signed)
[place] MAIN [facility] SERVICES [address] [place], [place], [address] Phone: [phone]   Fax:  [phone]  Physical Therapy Treatment  Patient Details  Name: Curtis Powell MRN: [id number] Date of Birth: [birth year] No data recorded  Encounter Date: 04/07/2018  PT End of Session - 04/07/18 1358    Visit Number  90    Number of Visits  99    Date for PT Re-Evaluation  04/09/18    Authorization Type  PN 10/10 starts 11/7) (next one 3/10 starting 12/19)     PT Start Time  1345    PT Stop Time  1430    PT Time Calculation (min)  45 min    Equipment Utilized During Treatment  Gait belt    Activity Tolerance  Patient tolerated treatment well    Behavior During Therapy  WFL for tasks assessed/performed       Past Medical History:  Diagnosis Date  . GERD (gastroesophageal reflux disease)   . Hyperlipidemia   . Hypertension   . Obesity   . Stroke [facility])     Past Surgical History:  Procedure Laterality Date  . BRAIN SURGERY      There were no vitals filed for this visit.  Subjective Assessment - 04/07/18 1354    Subjective  Patient states that he feels his R wheel is loose on his w/c. Patient reports no falls and denies any pain.     Pertinent History   Patient is a pleasant 52 year old male who presents to physical therapy for weakness and immobility secondary to CVA.  Had a stroke in Feb 2018. He was previously fully independent, but this stroke caused severe residual deficits, mainly on the right side as well as speech, and now he is unable to walk or perform most of his ADLs on his own. Entire right side is very weak. He still has a little difficulty with speech but his swallowing is improved to baseline. His mom had to move in with him and now is his main caregiver.     Limitations  Sitting;Lifting;Standing;Walking;House hold activities    How long can you sit comfortably?  5 minutes    How long can you stand comfortably?  5 minutes     How long can you walk comfortably?  10 ft    Patient Stated Goals  Walk without walker, walk further with walker, get some strength in back     Currently in Pain?  No/denies      TREATMENT 5lb ankle weights: back away from back of chair for trunk stability challenge Marching 3x15 each LE Long arc quad 5 second holds, 20x  BLE Seated GTB aBduction 20x with emphasis on eccentric control/ slow return.   // bars:  Ambulation/retroambulation x3 laps with seated rest between reps. BUE support. CGA/Min A for maintaining pelvic and trunk position particularly for retro ambulation. VCs and TCs provided for gluteal activation.  Seated glute sets between laps (5 sec hold x10x3) to encourage glute activation in standing Standing forward weight shift. 3# AW: x5 each leg; x5 each leg no weight. VCs and TCs provided for contralateral pelvic and hip stabilization; TCs for heel strike.    W/c wheel tightened/adjusted by rehab tech.  PT Education - 04/07/18 1358    Education provided  Yes    Education Details  exercise technique, ambulation    Person(s) Educated  Patient    Methods  Explanation;Demonstration;Tactile cues;Verbal cues    Comprehension  Verbalized understanding;Need further instruction;Returned demonstration;Tactile cues required;Verbal cues required       PT Short Term Goals - 03/26/18 1553      PT SHORT TERM GOAL #1   Title  Patient will be independent in home exercise program to improve strength/mobility for better functional independence with ADLs.    Baseline  hep compliant    Time  2    Period  Weeks    Status  Achieved      PT SHORT TERM GOAL #2   Title  Patient will require min cueing for STS transfer with CGA for increased independence with mobility.     Baseline  CGA    Time  2    Period  Weeks    Status  Achieved      PT SHORT TERM GOAL #3   Title  Patient will maintain upright posture for > 15 seconds to demonstrate strengthened postural  control muscualture     Baseline  18 seconds     Time  2    Period  Weeks    Status  Achieved        PT Long Term Goals - 03/26/18 1553      PT LONG TERM GOAL #1   Title  Patient will perform bed mobility with Supervision to increase independent mobility    Baseline  able to perform mod I for increased time , occasional Min A for scooting up in bed; 7/16: min guard for safety with increased effort and time, cues to scoot back in bed before sit>supine 9/12: can do it independently but with difficulty    Time  8    Period  Weeks    Status  Achieved      PT LONG TERM GOAL #2   Title  Patient (< 45 years old) will complete five times sit to stand test in < 10 seconds indicating an increased LE strength and improved balance.    Baseline  2/18: 30 seconds from plinth chair height single UE on seat and single on walker ; 32 seconds from plinth with single UE on seat and single on walker. 4/10: 26 seconds from [facility] with one hand on walker5/29: 22.2 seconds with one hand on walker one hand on WC; 7/16: one hand on walker one hand on WC 18.96 seconds; pt achieving 75% of full upright standing 8/22:one hand on walker one hand on WC 18 seconds; pt achieving 75% of full upright standing  9/12: 18 seconds econds; pt achieving 75% of full upright standing 8/22:one hand on walker one hand on WC 10/15: 17seconds; pt achieving 75% of full upright posture 11/7: 16 seconds; pt achieving 75 upright posture 12/19: 15 seconds 80% upright posture    Time  8    Period  Weeks    Status  Partially Met    Target Date  04/09/18      PT LONG TERM GOAL #3   Title  Patient will ambulate 40 ft with walker with no breaks to allow for increased mobility within home.     Baseline  12/26: 29 ft with walker and WC follow with no rest breaks; 2/18: multiple trials previous session of >100 ft     Time  8    Period  Weeks    Status  Achieved      PT LONG TERM GOAL #4   Title  Patient will increase BLE gross strength to 4+/5  as to improve functional strength for independent gait, increased  standing tolerance and increased ADL ability.    Baseline   4-/5 RLE, 10/15: 4/5 RLE 11/7: 4/5 RLE    Time  8    Period  Weeks    Status  Partially Met    Target Date  04/09/18      PT LONG TERM GOAL #5   Title  Patient will increase lower extremity functional scale to >40/80 to demonstrate improved functional mobility and increased tolerance with ADLs.     Baseline  14/80; 12/26: 21/80; 2/18: 28/80  4/10; 25/80 5/29: 27/80; 7/16: 29/80 8/22: 30/80  9/12: 26/80 (feeling more tired due to moving) 10/15: 37/80 11/7: 23/80    Time  8    Period  Weeks    Status  Partially Met    Target Date  04/09/18      PT LONG TERM GOAL #6   Title  Patient will walk through a door involving one small step up/down to increase functional independence.     Baseline  require CGA  BUE support in // bars at this time 10/15: required BUE and CGA in //bars    Time  8    Period  Weeks    Status  Deferred      PT LONG TERM GOAL #7   Title  Patient will ambulate 40 ft with least assistive device and Supervision with no breaks to allow for increased mobility within home.     Baseline  requires use of walker and CGA     Time  8    Period  Weeks    Status  Achieved      PT LONG TERM GOAL #8   Title  Patient will perform 10 MWT in < 1 minute to demonstrate improved ambulatory velocity and capacity.     Baseline  4/10: 2 minutes 45 seconds; 5/29: 2 minute 31 seconds; 3 minutes 22 seconds 8/22: 3 minutes 2 seconds  9/12:  2 minutes and 43 seconds 10/15: 28mnute 12 seconds 11/7: 1 minute 21 seconds 12/19: 1 min 16 seconds     Time  8    Period  Weeks    Status  Partially Met    Target Date  04/09/18      PT LONG TERM GOAL  #9   TITLE  Patient will increase Berg Balance score by > 6 points (26/56) to demonstrate decreased fall risk during functional activities.    Baseline  9/12: 20/56 10/15: 21/56 11/7: 20/56 12/19: 22/56     Time  8    Period   Weeks    Status  Partially Met    Target Date  04/09/18      PT LONG TERM GOAL  #10   TITLE  Patient (< 658years old) will complete five times sit to stand test with full upright posture in < 30 seconds indicating an increased glute activation, LE strength and improved balance.    Baseline  10/15: 37seconds 11/7: 20 seconds     Time  8    Period  Weeks    Status  Achieved      PT LONG TERM GOAL  #11   TITLE  Patient will ambulate 100 ft with least assistive device and Supervision with no breaks to allow for increased mobility within home.     Baseline  10/17: 811f11/7: 10052f  Time  8    Period  Weeks    Status  Achieved      PT LONG  TERM GOAL  #12   TITLE  Patient (< 23 years old) will complete five times sit to stand test with full upright posture in < 15 seconds indicating increased glute activation, LE strength and improved balance.    Baseline  11/7: 20 seconds 12/19: 18 seconds     Time  8    Period  Weeks    Status  Partially Met    Target Date  04/09/18      PT LONG TERM GOAL  #13   TITLE  Patient will ambulate 150 ft with least assistive device and Supervision with no breaks to allow for increased mobility within home.     Baseline  11/7: 128f 12/19: 115 ft     Time  8    Period  Weeks    Status  Partially Met    Target Date  04/09/18            Plan - 04/07/18 1438    Clinical Impression Statement  Patient presents to clinic with continued motivation for therapy. Patient was able to maintain erect posture and propulsion during forward ambulation in the parallel bars today with increased gluteal activation bilaterally. Patient continues to have deficits in strength, activity tolerance, balance, and mobility which will benefit from continued skilled therapeutic intervention.    Clinical Impairments Affecting Rehab Potential  hx of HTN, HLD, CVA, learning disability, Diabetes, brain tumor,     PT Frequency  2x / week    PT Duration  8 weeks    PT  Treatment/Interventions  ADLs/Self Care Home Management;Aquatic Therapy;Ultrasound;Moist Heat;Traction;DME Instruction;Gait training;Stair training;Functional mobility training;Therapeutic activities;Therapeutic exercise;Orthotic Fit/Training;Neuromuscular re-education;Balance training;Patient/family education;Manual techniques;Wheelchair mobility training;Passive range of motion;Energy conservation;Taping;Visual/perceptual remediation/compensation    PT Next Visit Plan  balance and stability and ambulation, anterior weight shift hold    PT Home Exercise Plan  TrA contraction     Consulted and Agree with Plan of Care  Patient       Patient will benefit from skilled therapeutic intervention in order to improve the following deficits and impairments:  Abnormal gait, Decreased activity tolerance, Decreased balance, Decreased knowledge of precautions, Decreased endurance, Decreased coordination, Decreased knowledge of use of DME, Decreased mobility, Decreased range of motion, Difficulty walking, Decreased safety awareness, Decreased strength, Impaired flexibility, Impaired perceived functional ability, Impaired tone, Postural dysfunction, Improper body mechanics, Pain  Visit Diagnosis: Muscle weakness (generalized)  Other lack of coordination  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 [place]  . Left-sided weakness 05/01/2016   KMyles GipPT, DPT #[id number]/31/2019, 2:40 PM  [place] [facility] [address]RHarts[address] [place] [address]Phone: [phone]  Fax:  [phone] Name: BTREYSHAUN KEATTSMRN: [id number] of Birth: 911-21-1967

## 2018-04-07 NOTE — Therapy (Signed)
[place] MAIN [facility] SERVICES [address] [place], [place], [address] Phone: [phone]   Fax:  [phone]  Occupational Therapy Treatment  Patient Details  Name: Curtis Powell MRN: [id number] Date of Birth: 23-May-1965 No data recorded  Encounter Date: 04/07/2018  OT End of Session - 04/07/18 1436    Visit Number  2    Number of Visits  55    Date for OT Re-Evaluation  04/08/18    Authorization Type  Visit 9 of 10 for progress reporting period starting 02/26/2018    OT Start Time  1430    OT Stop Time  1515    OT Time Calculation (min)  45 min    Behavior During Therapy  [facility] for tasks assessed/performed       Past Medical History:  Diagnosis Date  . GERD (gastroesophageal reflux disease)   . Hyperlipidemia   . Hypertension   . Obesity   . Stroke [facility])     Past Surgical History:  Procedure Laterality Date  . BRAIN SURGERY      There were no vitals filed for this visit.  Subjective Assessment - 04/07/18 1435    Subjective   Pt. reportshat he has nothing planned this year for New Years    Pertinent History  Pt. is a 52 y.o. male who suffered a CVA on 05/01/2016. Pt. was admitted to the hospital. Once discharged, he received Home Health PT and OT services for about a month. Pt. has had multiple CVAs over the past 8 years, and has had multiple falls in the past 6 months. Pt. resides in an apartment. Pt. has caregivers for 80 hours. Pt.'s mother stays with pt. at night, and assists with IADL tasks.      Patient Stated Goals  To be able to throw a ball and dribble a ball, do as much as I can for myself.     Currently in Pain?  No/denies      OT TREATMENT    Neuro muscular re-education:  Pt. worked on Airline pilot written text, and handwriting. Pt. Utilized a mature pen grasp on a standard pen, and maintained the grasp throughout the duration of the writing task. Pt. wrote with 50% legibility for printing, and 25%  for handwriting. Pt. worked on check writing tasks using printed form, as well as cursive for the signature. No deviation from the line, decreased spacing with larger print text was formulated.                          OT Education - 04/07/18 1436    Education provided  Yes    Education Details  RUE Functioning    Person(s) Educated  Patient    Methods  Explanation;Demonstration    Comprehension  Verbalized understanding;Returned demonstration;Verbal cues required       OT Short Term Goals - 03/03/17 1459      OT SHORT TERM GOAL #1   Title  `        OT Long Term Goals - 02/24/18 1445      OT LONG TERM GOAL #2   Title  Pt. will complete self grooming with minA    Baseline  02/24/2018 - Pt is independent with oral care and washing his face and occasionally requires increased time if he is having a bad day. Pt's shaving is completed by his barber.    Time  12    Period  Weeks    Status  On-going    Target Date  05/19/18      OT LONG TERM GOAL #4   Title  Pt. will perform self dressing with minA and A/E as needed.    Baseline  02/24/2018 - Pt. continues to improve LB dressing techniques while seated. Pt required extensive time and mod A to complete LB dressing. Pt. requires min A for UB dressing.    Time  12    Period  Weeks    Status  On-going    Target Date  05/19/18      OT LONG TERM GOAL #6   Title  Pt. will write his name efficiently with 100% legibility    Baseline  02/24/2018 - Pt to continue to work to increase legibility and efficiency when writing name to sign in when riding the [place]    Time  8    Period  Weeks    Status  On-going    Target Date  04/21/18      OT LONG TERM GOAL #7   Title  Pt will independently and consistently follow HEP to increase UE strength to increase functional independence    Baseline  02/24/2018 - Pt to continue to work towards independence with exercises    Time  6    Period  Weeks    Status  On-going    Target  Date  04/07/18            Plan - 04/07/18 1437    Occupational performance deficits (Please refer to evaluation for details):  ADL's;IADL's    Rehab Potential  Fair    OT Frequency  2x / week    OT Duration  12 weeks    OT Treatment/Interventions  Self-care/ADL training;Patient/family education;DME and/or AE instruction;Therapeutic exercise;Moist Heat;Neuromuscular education    Clinical Decision Making  Several treatment options, min-mod task modification necessary    Consulted and Agree with Plan of Care  Patient       Patient will benefit from skilled therapeutic intervention in order to improve the following deficits and impairments:  Pain, Decreased cognition, Decreased activity tolerance, Impaired UE functional use, Decreased range of motion, Decreased coordination, Decreased endurance  Visit Diagnosis: Muscle weakness (generalized)  Other lack of coordination    Problem List Patient Active Problem List   Diagnosis Date Noted  . Acute CVA (cerebrovascular accident) ([place]) 05/02/2016  . Left-sided weakness 05/01/2016    Harrel Carina, MS, OTR/L 04/07/2018, 2:39 PM  [place] MAIN [facility] SERVICES [address] [place], [place], [address] Phone: [phone]   Fax:  [phone]  Name: DHRUV CHRISTINA MRN: [id number] Date of Birth: May 07, 1965

## 2018-04-09 ENCOUNTER — Encounter: Payer: Self-pay | Admitting: Occupational Therapy

## 2018-04-09 ENCOUNTER — Ambulatory Visit: Payer: Medicare HMO | Admitting: Physical Therapy

## 2018-04-09 ENCOUNTER — Ambulatory Visit: Payer: Medicare HMO | Attending: Family Medicine | Admitting: Occupational Therapy

## 2018-04-09 VITALS — BP 143/82 | HR 87 | Temp 99.3°F | Resp 18

## 2018-04-09 DIAGNOSIS — I69351 Hemiplegia and hemiparesis following cerebral infarction affecting right dominant side: Secondary | ICD-10-CM | POA: Insufficient documentation

## 2018-04-09 DIAGNOSIS — R2689 Other abnormalities of gait and mobility: Secondary | ICD-10-CM | POA: Diagnosis present

## 2018-04-09 DIAGNOSIS — R278 Other lack of coordination: Secondary | ICD-10-CM | POA: Insufficient documentation

## 2018-04-09 DIAGNOSIS — M6281 Muscle weakness (generalized): Secondary | ICD-10-CM | POA: Diagnosis not present

## 2018-04-09 NOTE — Therapy (Signed)
[place] MAIN [facility] SERVICES [address] [place], [place], [address] Phone: [phone]   Fax:  [phone]  Physical Therapy Treatment  Patient Details  Name: Curtis Powell MRN: [id number] Date of Birth: February 19, 1966 No data recorded  Encounter Date: 04/09/2018  PT End of Session - 04/09/18 1438    Visit Number  91    Number of Visits  99    Date for PT Re-Evaluation  04/09/18    Authorization Type  PN 10/10 starts 11/7) (next one 4/10 starting 12/19)     PT Start Time  1435    PT Stop Time  1517    PT Time Calculation (min)  42 min    Equipment Utilized During Treatment  Gait belt    Activity Tolerance  Patient tolerated treatment well    Behavior During Therapy  WFL for tasks assessed/performed       Past Medical History:  Diagnosis Date  . GERD (gastroesophageal reflux disease)   . Hyperlipidemia   . Hypertension   . Obesity   . Stroke [facility])     Past Surgical History:  Procedure Laterality Date  . BRAIN SURGERY      There were no vitals filed for this visit.  Subjective Assessment - 04/09/18 1437    Subjective  Patient reports he's a little tired today because his driver had an issue with his GPS on the way here and it took longer. Patient states no falls and denies pain.    Pertinent History   Patient is a pleasant 53 year old male who presents to physical therapy for weakness and immobility secondary to CVA.  Had a stroke in Feb 2018. He was previously fully independent, but this stroke caused severe residual deficits, mainly on the right side as well as speech, and now he is unable to walk or perform most of his ADLs on his own. Entire right side is very weak. He still has a little difficulty with speech but his swallowing is improved to baseline. His mom had to move in with him and now is his main caregiver.     Limitations  Sitting;Lifting;Standing;Walking;House hold activities    How long can you sit comfortably?  5  minutes    How long can you stand comfortably?  5 minutes    How long can you walk comfortably?  10 ft    Patient Stated Goals  Walk without walker, walk further with walker, get some strength in back     Currently in Pain?  No/denies       TREATMENT Therapeutic Exercise: 5lb ankle weights: back away from back of chair for trunk stability challenge Marching 3x15 each LE Long arc quad 5 second holds, 10x2 each LE BLE SeatedGTB aBduction20xwith emphasis on eccentric control/ slow return. BLE Seated GTB adduction 20 x with emphasis on eccentric control/slow return.  Neuromuscular Re-education: Seated alternating ball kick (LAQ) s for coordination and reaction time x15 each leg. Seated ball kick to a target; patient kicked a moving ball in response to a PT specified target offered while the patient was tracking the ball. x20 reps alternating feet Seated soccer ball pass (stop ball, pass to opposite LE, and return to PT) x10 each leg.  PT Education - 04/10/18 0945    Education provided  Yes    Education Details  exercise technique    Person(s) Educated  Patient    Methods  Explanation;Demonstration;Verbal cues    Comprehension  Verbalized understanding;Returned  demonstration;Need further instruction;Verbal cues required       PT Short Term Goals - 03/26/18 1553      PT SHORT TERM GOAL #1   Title  Patient will be independent in home exercise program to improve strength/mobility for better functional independence with ADLs.    Baseline  hep compliant    Time  2    Period  Weeks    Status  Achieved      PT SHORT TERM GOAL #2   Title  Patient will require min cueing for STS transfer with CGA for increased independence with mobility.     Baseline  CGA    Time  2    Period  Weeks    Status  Achieved      PT SHORT TERM GOAL #3   Title  Patient will maintain upright posture for > 15 seconds to demonstrate strengthened postural control muscualture      Baseline  18 seconds     Time  2    Period  Weeks    Status  Achieved        PT Long Term Goals - 03/26/18 1553      PT LONG TERM GOAL #1   Title  Patient will perform bed mobility with Supervision to increase independent mobility    Baseline  able to perform mod I for increased time , occasional Min A for scooting up in bed; 7/16: min guard for safety with increased effort and time, cues to scoot back in bed before sit>supine 9/12: can do it independently but with difficulty    Time  8    Period  Weeks    Status  Achieved      PT LONG TERM GOAL #2   Title  Patient (< 75 years old) will complete five times sit to stand test in < 10 seconds indicating an increased LE strength and improved balance.    Baseline  2/18: 30 seconds from plinth chair height single UE on seat and single on walker ; 32 seconds from plinth with single UE on seat and single on walker. 4/10: 26 seconds from [facility] with one hand on walker5/29: 22.2 seconds with one hand on walker one hand on WC; 7/16: one hand on walker one hand on WC 18.96 seconds; pt achieving 75% of full upright standing 8/22:one hand on walker one hand on WC 18 seconds; pt achieving 75% of full upright standing  9/12: 18 seconds econds; pt achieving 75% of full upright standing 8/22:one hand on walker one hand on WC 10/15: 17seconds; pt achieving 75% of full upright posture 11/7: 16 seconds; pt achieving 75 upright posture 12/19: 15 seconds 80% upright posture    Time  8    Period  Weeks    Status  Partially Met    Target Date  04/09/18      PT LONG TERM GOAL #3   Title  Patient will ambulate 40 ft with walker with no breaks to allow for increased mobility within home.     Baseline  12/26: 29 ft with walker and WC follow with no rest breaks; 2/18: multiple trials previous session of >100 ft     Time  8    Period  Weeks    Status  Achieved      PT LONG TERM GOAL #4   Title  Patient will increase BLE gross strength to 4+/5 as to improve functional  strength for independent gait, increased standing tolerance and increased  ADL ability.    Baseline   4-/5 RLE, 10/15: 4/5 RLE 11/7: 4/5 RLE    Time  8    Period  Weeks    Status  Partially Met    Target Date  04/09/18      PT LONG TERM GOAL #5   Title  Patient will increase lower extremity functional scale to >40/80 to demonstrate improved functional mobility and increased tolerance with ADLs.     Baseline  14/80; 12/26: 21/80; 2/18: 28/80  4/10; 25/80 5/29: 27/80; 7/16: 29/80 8/22: 30/80  9/12: 26/80 (feeling more tired due to moving) 10/15: 37/80 11/7: 23/80    Time  8    Period  Weeks    Status  Partially Met    Target Date  04/09/18      PT LONG TERM GOAL #6   Title  Patient will walk through a door involving one small step up/down to increase functional independence.     Baseline  require CGA  BUE support in // bars at this time 10/15: required BUE and CGA in //bars    Time  8    Period  Weeks    Status  Deferred      PT LONG TERM GOAL #7   Title  Patient will ambulate 40 ft with least assistive device and Supervision with no breaks to allow for increased mobility within home.     Baseline  requires use of walker and CGA     Time  8    Period  Weeks    Status  Achieved      PT LONG TERM GOAL #8   Title  Patient will perform 10 MWT in < 1 minute to demonstrate improved ambulatory velocity and capacity.     Baseline  4/10: 2 minutes 45 seconds; 5/29: 2 minute 31 seconds; 3 minutes 22 seconds 8/22: 3 minutes 2 seconds  9/12:  2 minutes and 43 seconds 10/15: 45mnute 12 seconds 11/7: 1 minute 21 seconds 12/19: 1 min 16 seconds     Time  8    Period  Weeks    Status  Partially Met    Target Date  04/09/18      PT LONG TERM GOAL  #9   TITLE  Patient will increase Berg Balance score by > 6 points (26/56) to demonstrate decreased fall risk during functional activities.    Baseline  9/12: 20/56 10/15: 21/56 11/7: 20/56 12/19: 22/56     Time  8    Period  Weeks    Status   Partially Met    Target Date  04/09/18      PT LONG TERM GOAL  #10   TITLE  Patient (< 657years old) will complete five times sit to stand test with full upright posture in < 30 seconds indicating an increased glute activation, LE strength and improved balance.    Baseline  10/15: 37seconds 11/7: 20 seconds     Time  8    Period  Weeks    Status  Achieved      PT LONG TERM GOAL  #11   TITLE  Patient will ambulate 100 ft with least assistive device and Supervision with no breaks to allow for increased mobility within home.     Baseline  10/17: [id number]/7: 10074f  Time  8    Period  Weeks    Status  Achieved      PT LONG TERM GOAL  #12  TITLE  Patient (< 68 years old) will complete five times sit to stand test with full upright posture in < 15 seconds indicating increased glute activation, LE strength and improved balance.    Baseline  11/7: 20 seconds 12/19: 18 seconds     Time  8    Period  Weeks    Status  Partially Met    Target Date  04/09/18      PT LONG TERM GOAL  #13   TITLE  Patient will ambulate 150 ft with least assistive device and Supervision with no breaks to allow for increased mobility within home.     Baseline  11/7: 155f 12/19: 115 ft     Time  8    Period  Weeks    Status  Partially Met    Target Date  04/09/18            Plan - 04/10/18 0945    Clinical Impression Statement  Patient presents to clinic with decreased energy levels, but is amenable to therapy. Patient able to tolerate increases in repetitions for seated BLE strengthening activities. Additionally, patient demonstrates improving cooridnation when kicking a ball toward a target with greater accuracy and the ability to react to verbal cues mid-repetition. Patient will continue to benefit from skilled therapeutic intervention to address deficits in strength, activity tolerance, balance, and mobility for improved QOL and function.    Clinical Impairments Affecting Rehab Potential  hx of HTN, HLD,  CVA, learning disability, Diabetes, brain tumor,     PT Frequency  2x / week    PT Duration  8 weeks    PT Treatment/Interventions  ADLs/Self Care Home Management;Aquatic Therapy;Ultrasound;Moist Heat;Traction;DME Instruction;Gait training;Stair training;Functional mobility training;Therapeutic activities;Therapeutic exercise;Orthotic Fit/Training;Neuromuscular re-education;Balance training;Patient/family education;Manual techniques;Wheelchair mobility training;Passive range of motion;Energy conservation;Taping;Visual/perceptual remediation/compensation    PT Next Visit Plan  balance and stability and ambulation, anterior weight shift hold    PT Home Exercise Plan  TrA contraction, glute sets    Consulted and Agree with Plan of Care  Patient       Patient will benefit from skilled therapeutic intervention in order to improve the following deficits and impairments:  Abnormal gait, Decreased activity tolerance, Decreased balance, Decreased knowledge of precautions, Decreased endurance, Decreased coordination, Decreased knowledge of use of DME, Decreased mobility, Decreased range of motion, Difficulty walking, Decreased safety awareness, Decreased strength, Impaired flexibility, Impaired perceived functional ability, Impaired tone, Postural dysfunction, Improper body mechanics, Pain  Visit Diagnosis: Other lack of coordination  Muscle weakness (generalized)  Other abnormalities of gait and mobility  Hemiplegia and hemiparesis following cerebral infarction affecting right dominant side ([facility]     Problem List Patient Active Problem List   Diagnosis Date Noted  . [place]  . Left-sided weakness 05/01/2016   KMyles GipPT, DPT #[id number]/06/2018, 9:51 AM  [place] [facility] [address] [address]Phone: [phone]  Fax:  [phone] Name: Curtis COLVINMRN:  [id number] of Birth: [birth year]

## 2018-04-09 NOTE — Therapy (Signed)
[place] MAIN [facility] SERVICES [address] [place], [place], [address] Phone: [phone]   Fax:  [phone]  Occupational Therapy Treatment  Patient Details  Name: Curtis Powell MRN: [id number] Date of Birth: [birth year] No data recorded  Encounter Date: 04/09/2018  OT End of Session - 04/09/18 1327    Visit Number  46    Number of Visits  46    Date for OT Re-Evaluation  07/02/18    Authorization Type  Visit 10 of 10 for progress reporting period starting 02/26/2018    OT Start Time  1320    OT Stop Time  1400    OT Time Calculation (min)  40 min    Activity Tolerance  Patient tolerated treatment well    Behavior During Therapy  [facility] for tasks assessed/performed       Past Medical History:  Diagnosis Date  . GERD (gastroesophageal reflux disease)   . Hyperlipidemia   . Hypertension   . Obesity   . Stroke [facility])     Past Surgical History:  Procedure Laterality Date  . BRAIN SURGERY      There were no vitals filed for this visit.  Subjective Assessment - 04/09/18 1326    Subjective   No changes, had a good New Year Eve.  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     Pertinent History  Pt. is a 53 y.o. male who suffered a CVA on 05/01/2016. Pt. was admitted to the hospital. Once discharged, he received Home Health PT and OT services for about a month. Pt. has had multiple CVAs over the past 8 years, and has had multiple falls in the past 6 months. Pt. resides in an apartment. Pt. has caregivers for 80 hours. Pt.'s mother stays with pt. at night, and assists with IADL tasks.      Patient Stated Goals  To be able to throw a ball and dribble a ball, do as much as I can for myself.     Currently in Pain?  No/denies    Pain Score  0-No pain          Reassessment of skills this date:  Grip strength  Right 44#, left 47# Lateral pinch R 13#, left 18# 3 point pinch right 14#, left 14# 2 point pinch right 12#, left 12#  9 hole peg  test  Right 47 secs Left 41 secs  Patient continues to require moderate to max assist with lower body dressing skills.  He is unable to tie his shoes,  Unable to put on socks.  He can get one leg into pants but requires assist to do the other side.   Patient also has difficulty with reaching into and lifting water jug from the refrigerator and to pour a drink, he would Like to add this to his goals.   He feels handwriting has improved but still has difficulty at times.  Able to write name in print with fair legibility and  then in cursive with poor Legibility.  He has difficulty with writing his address and recalling from memory.  If given Address he will try to copy.     Patient seen this date for finger strengthening tasks with moderate resistive bulletin board and push pins to place and remove,  Cues for prehension patterns and technique to push pin in fully.   Patient seen for handwriting skills for name, script and print for multiple repetitions with smaller lined paper Used regular grip  pen, medium grip and then larger grip pen.  He tends to prefer the regular grip pen. Legibility is fair.  Unable to write his address without having it written to copy.  Difficulty with performing the number 9 but  Was able to correct with cues and sample from therapist on how to form the number.                      OT Long Term Goals - 04/09/18 1328      OT LONG TERM GOAL #2   Title  Pt. will complete self grooming with minA    Baseline  02/24/2018 - Pt is independent with oral care and washing his face and occasionally requires increased time if he is having a bad day. Pt's shaving is completed by his barber.    Time  12    Period  Weeks    Status  Achieved      OT LONG TERM GOAL #4   Title  Pt. will perform self dressing with minA and A/E as needed.    Baseline  02/24/2018 - Pt. continues to improve LB dressing techniques while seated. Pt required extensive time and mod A to  complete LB dressing. Pt. requires min A for UB dressing.    Time  12    Period  Weeks    Status  On-going      OT LONG TERM GOAL #6   Title  Pt. will write his name efficiently with 100% legibility    Baseline  02/24/2018 - Pt to continue to work to increase legibility and efficiency when writing name to sign in when riding the [place]    Time  8    Period  Weeks    Status  On-going      OT LONG TERM GOAL #7   Title  Pt will independently and consistently follow HEP to increase UE strength to increase functional independence    Baseline  02/24/2018 - Pt to continue to work towards independence with exercises    Time  6    Period  Weeks    Status  On-going            Plan - 04/09/18 1327    Clinical Impression Statement  Patient seen for reassessment of skills this date, goals updated to reflect progress. Patient continues to demonstrate difficulty with lower body self care tasks and reqiures assistance at home from an aide.  Would benefit from continued focus on this area along with adaptive equipment training to improve performance in this area.  Handwriting has slowly improved and legibility is fair.  Grip strength has been stable but has the potential for improvements.  Patient continues to benefit from skilled OT to maximize safety and independence in daily tasks     Occupational performance deficits (Please refer to evaluation for details):  ADL's;IADL's    Rehab Potential  Fair    OT Frequency  2x / week    OT Duration  12 weeks    OT Treatment/Interventions  Self-care/ADL training;Patient/family education;DME and/or AE instruction;Therapeutic exercise;Moist Heat;Neuromuscular education    Consulted and Agree with Plan of Care  Patient       Patient will benefit from skilled therapeutic intervention in order to improve the following deficits and impairments:  Pain, Decreased cognition, Decreased activity tolerance, Impaired UE functional use, Decreased range of motion,  Decreased coordination, Decreased endurance  Visit Diagnosis: Muscle weakness (generalized)  Other lack of coordination  Hemiplegia and hemiparesis following cerebral infarction affecting right dominant side [facility])    Problem List Patient Active Problem List   Diagnosis Date Noted  . Acute CVA (cerebrovascular accident) ([place]) 05/02/2016  . Left-sided weakness 05/01/2016   Amy T [name], OTR/L, CLT  Lovett,Amy 04/10/2018, 8:06 PM  [place] MAIN [facility] SERVICES [address] [place], [place], [address] Phone: [phone]   Fax:  [phone]  Name: Curtis Powell MRN: [id number] Date of Birth: [birth year]

## 2018-04-14 ENCOUNTER — Ambulatory Visit: Payer: Medicare HMO | Admitting: Occupational Therapy

## 2018-04-14 ENCOUNTER — Ambulatory Visit: Payer: Medicare HMO

## 2018-04-14 DIAGNOSIS — M6281 Muscle weakness (generalized): Secondary | ICD-10-CM

## 2018-04-14 DIAGNOSIS — R278 Other lack of coordination: Secondary | ICD-10-CM

## 2018-04-14 NOTE — Therapy (Signed)
[place] MAIN [facility] SERVICES [address] [place], [place], [address] Phone: [phone]   Fax:  [phone]  Physical Therapy Treatment/Recertification  Dates of reporting period  02/12/18    to   04/14/18  Patient Details  Name: Curtis Powell MRN: [id number] Date of Birth: [birth year] No data recorded  Encounter Date: 04/14/2018  PT End of Session - 04/15/18 1257    Visit Number  92    Number of Visits  115    Date for PT Re-Evaluation  06/09/18    Authorization Type  Progress note: 4/10, next visit is 1/10 last goals: 12/19     PT Start Time  1430    PT Stop Time  1515    PT Time Calculation (min)  45 min    Equipment Utilized During Treatment  Gait belt    Activity Tolerance  Patient tolerated treatment well    Behavior During Therapy  WFL for tasks assessed/performed       Past Medical History:  Diagnosis Date  . GERD (gastroesophageal reflux disease)   . Hyperlipidemia   . Hypertension   . Obesity   . Stroke [facility])     Past Surgical History:  Procedure Laterality Date  . BRAIN SURGERY      There were no vitals filed for this visit.  Subjective Assessment - 04/14/18 1432    Subjective  Pt reports that he is doing alright today. Is not complaining of significant fatigue today. No specific or concerns currently. Denies pain at this time.     Pertinent History   Patient is a pleasant 53 year old male who presents to physical therapy for weakness and immobility secondary to CVA.  Had a stroke in Feb 2018. He was previously fully independent, but this stroke caused severe residual deficits, mainly on the right side as well as speech, and now he is unable to walk or perform most of his ADLs on his own. Entire right side is very weak. He still has a little difficulty with speech but his swallowing is improved to baseline. His mom had to move in with him and now is his main caregiver.     Limitations   Sitting;Lifting;Standing;Walking;House hold activities    How long can you sit comfortably?  5 minutes    How long can you stand comfortably?  5 minutes    How long can you walk comfortably?  10 ft    Patient Stated Goals  Walk without walker, walk further with walker, get some strength in back     Currently in Pain?  No/denies          TREATMENT   Therapeutic Exercise: Seated clams with manual resistance 2 x 15 bilateral; Seated adductor squeeze with manual resistance 2 x 15 bilateral; Seated marches with manual resistance 2 x 15 bilateral; Seated heel raises with manual resistance 2 x 15 bilateral; Seated LAQ with manual resistance 2 x 15 bilateral; Sit to stand without UE support from wheelchair with 2 Airex pads on seat x 10;   Neuromuscular Re-education: Seated alternating ball kick (LAQ) s for coordination and reaction time x15 each leg. Ambulation/retroambulation x 3 lengths in // bars with seated rest between reps. BUE support. CGA/Min A for maintaining pelvic and trunk position particularly for retro ambulation. VCs and TCs provided for proper anterior/posterior weight distribution; Stepping over cane with BUE support to work on step length x 5 each LE; Seated glute sets 3 sec hold x  10 to encourage glute activation in standing;   Pt educated throughout session about proper posture and technique with exercises. Improved exercise technique, movement at target joints, use of target muscles after min to mod verbal, visual, tactile cues.     Patient demonstrates good motivation today. He does well with all exercises prescribed during session and requires intermittent seated rest breaks throughout. He struggles with step length and anterior weight shifting with ambulation in parallel bars. When patient's goals were last updated he continued to demonstrate steady progress. He will continue to benefit from skilled therapeutic intervention to address deficits in strength,  activity tolerance, balance, and mobility for improved QOL and function.                 PT Short Term Goals - 03/26/18 1553      PT SHORT TERM GOAL #1   Title  Patient will be independent in home exercise program to improve strength/mobility for better functional independence with ADLs.    Baseline  hep compliant    Time  2    Period  Weeks    Status  Achieved      PT SHORT TERM GOAL #2   Title  Patient will require min cueing for STS transfer with CGA for increased independence with mobility.     Baseline  CGA    Time  2    Period  Weeks    Status  Achieved      PT SHORT TERM GOAL #3   Title  Patient will maintain upright posture for > 15 seconds to demonstrate strengthened postural control muscualture     Baseline  18 seconds     Time  2    Period  Weeks    Status  Achieved        PT Long Term Goals - 03/26/18 1553      PT LONG TERM GOAL #1   Title  Patient will perform bed mobility with Supervision to increase independent mobility    Baseline  able to perform mod I for increased time , occasional Min A for scooting up in bed; 7/16: min guard for safety with increased effort and time, cues to scoot back in bed before sit>supine 9/12: can do it independently but with difficulty    Time  8    Period  Weeks    Status  Achieved      PT LONG TERM GOAL #2   Title  Patient (< 2 years old) will complete five times sit to stand test in < 10 seconds indicating an increased LE strength and improved balance.    Baseline  2/18: 30 seconds from plinth chair height single UE on seat and single on walker ; 32 seconds from plinth with single UE on seat and single on walker. 4/10: 26 seconds from [facility] with one hand on walker5/29: 22.2 seconds with one hand on walker one hand on WC; 7/16: one hand on walker one hand on WC 18.96 seconds; pt achieving 75% of full upright standing 8/22:one hand on walker one hand on WC 18 seconds; pt achieving 75% of full upright standing  9/12: 18  seconds econds; pt achieving 75% of full upright standing 8/22:one hand on walker one hand on WC 10/15: 17seconds; pt achieving 75% of full upright posture 11/7: 16 seconds; pt achieving 75 upright posture 12/19: 15 seconds 80% upright posture    Time  8    Period  Weeks    Status  Partially  Met    Target Date  04/09/18      PT LONG TERM GOAL #3   Title  Patient will ambulate 40 ft with walker with no breaks to allow for increased mobility within home.     Baseline  12/26: 29 ft with walker and WC follow with no rest breaks; 2/18: multiple trials previous session of >100 ft     Time  8    Period  Weeks    Status  Achieved      PT LONG TERM GOAL #4   Title  Patient will increase BLE gross strength to 4+/5 as to improve functional strength for independent gait, increased standing tolerance and increased ADL ability.    Baseline   4-/5 RLE, 10/15: 4/5 RLE 11/7: 4/5 RLE    Time  8    Period  Weeks    Status  Partially Met    Target Date  04/09/18      PT LONG TERM GOAL #5   Title  Patient will increase lower extremity functional scale to >40/80 to demonstrate improved functional mobility and increased tolerance with ADLs.     Baseline  14/80; 12/26: 21/80; 2/18: 28/80  4/10; 25/80 5/29: 27/80; 7/16: 29/80 8/22: 30/80  9/12: 26/80 (feeling more tired due to moving) 10/15: 37/80 11/7: 23/80    Time  8    Period  Weeks    Status  Partially Met    Target Date  04/09/18      PT LONG TERM GOAL #6   Title  Patient will walk through a door involving one small step up/down to increase functional independence.     Baseline  require CGA  BUE support in // bars at this time 10/15: required BUE and CGA in //bars    Time  8    Period  Weeks    Status  Deferred      PT LONG TERM GOAL #7   Title  Patient will ambulate 40 ft with least assistive device and Supervision with no breaks to allow for increased mobility within home.     Baseline  requires use of walker and CGA     Time  8    Period   Weeks    Status  Achieved      PT LONG TERM GOAL #8   Title  Patient will perform 10 MWT in < 1 minute to demonstrate improved ambulatory velocity and capacity.     Baseline  4/10: 2 minutes 45 seconds; 5/29: 2 minute 31 seconds; 3 minutes 22 seconds 8/22: 3 minutes 2 seconds  9/12:  2 minutes and 43 seconds 10/15: 57mnute 12 seconds 11/7: 1 minute 21 seconds 12/19: 1 min 16 seconds     Time  8    Period  Weeks    Status  Partially Met    Target Date  04/09/18      PT LONG TERM GOAL  #9   TITLE  Patient will increase Berg Balance score by > 6 points (26/56) to demonstrate decreased fall risk during functional activities.    Baseline  9/12: 20/56 10/15: 21/56 11/7: 20/56 12/19: 22/56     Time  8    Period  Weeks    Status  Partially Met    Target Date  04/09/18      PT LONG TERM GOAL  #10   TITLE  Patient (< 679years old) will complete five times sit to stand test with full upright  posture in < 30 seconds indicating an increased glute activation, LE strength and improved balance.    Baseline  10/15: 37seconds 11/7: 20 seconds     Time  8    Period  Weeks    Status  Achieved      PT LONG TERM GOAL  #11   TITLE  Patient will ambulate 100 ft with least assistive device and Supervision with no breaks to allow for increased mobility within home.     Baseline  10/17: 95f 11/7: 1015f   Time  8    Period  Weeks    Status  Achieved      PT LONG TERM GOAL  #12   TITLE  Patient (< 6070ears old) will complete five times sit to stand test with full upright posture in < 15 seconds indicating increased glute activation, LE strength and improved balance.    Baseline  11/7: 20 seconds 12/19: 18 seconds     Time  8    Period  Weeks    Status  Partially Met    Target Date  04/09/18      PT LONG TERM GOAL  #13   TITLE  Patient will ambulate 150 ft with least assistive device and Supervision with no breaks to allow for increased mobility within home.     Baseline  11/7: 10054f2/19: 115 ft      Time  8    Period  Weeks    Status  Partially Met    Target Date  04/09/18            Plan - 04/15/18 1259    Clinical Impression Statement  Patient demonstrates good motivation today. He does well with all exercises prescribed during session and requires intermittent seated rest breaks throughout. He struggles with step length and anterior weight shifting with ambulation in parallel bars. When patient's goals were last updated he continued to demonstrate steady progress. He will continue to benefit from skilled therapeutic intervention to address deficits in strength, activity tolerance, balance, and mobility for improved QOL and function.    Clinical Impairments Affecting Rehab Potential  hx of HTN, HLD, CVA, learning disability, Diabetes, brain tumor,     PT Frequency  2x / week    PT Duration  8 weeks    PT Treatment/Interventions  ADLs/Self Care Home Management;Aquatic Therapy;Ultrasound;Moist Heat;Traction;DME Instruction;Gait training;Stair training;Functional mobility training;Therapeutic activities;Therapeutic exercise;Orthotic Fit/Training;Neuromuscular re-education;Balance training;Patient/family education;Manual techniques;Wheelchair mobility training;Passive range of motion;Energy conservation;Taping;Visual/perceptual remediation/compensation    PT Next Visit Plan  balance and stability and ambulation, anterior weight shift hold    PT Home Exercise Plan  TrA contraction, glute sets    Consulted and Agree with Plan of Care  Patient       Patient will benefit from skilled therapeutic intervention in order to improve the following deficits and impairments:  Abnormal gait, Decreased activity tolerance, Decreased balance, Decreased knowledge of precautions, Decreased endurance, Decreased coordination, Decreased knowledge of use of DME, Decreased mobility, Decreased range of motion, Difficulty walking, Decreased safety awareness, Decreased strength, Impaired flexibility, Impaired  perceived functional ability, Impaired tone, Postural dysfunction, Improper body mechanics, Pain  Visit Diagnosis: Muscle weakness (generalized)  Other lack of coordination     Problem List Patient Active Problem List   Diagnosis Date Noted  . Acute CVA (cerebrovascular accident) ([place]2018  . Left-sided weakness 05/01/2016   JasLyndel Safeprich PT, DPT, GCS  Huprich,Jason 04/15/2018, 1:40 PM  [place], [place], [address] Phone: [phone]   Fax:  [phone]  Name: Curtis Powell MRN: [id number] Date of Birth: 1965-08-15

## 2018-04-14 NOTE — Therapy (Signed)
[place] MAIN [facility] SERVICES [address] [place], [place], [address] Phone: [phone]   Fax:  [phone]  Occupational Therapy Treatment  Patient Details  Name: Curtis Powell MRN: [id number] Date of Birth: 1965-08-29 No data recorded  Encounter Date: 04/14/2018  OT End of Session - 04/14/18 1341    Visit Number  30    Number of Visits  63    Date for OT Re-Evaluation  07/02/18    Authorization Type  Visit 1 of 10 for progress reporting period starting 04/09/2018    OT Start Time  1330    OT Stop Time  1415    OT Time Calculation (min)  45 min    Activity Tolerance  Patient tolerated treatment well    Behavior During Therapy  [facility] for tasks assessed/performed       Past Medical History:  Diagnosis Date  . GERD (gastroesophageal reflux disease)   . Hyperlipidemia   . Hypertension   . Obesity   . Stroke [facility])     Past Surgical History:  Procedure Laterality Date  . BRAIN SURGERY      There were no vitals filed for this visit.  OT TREATMENT    Neuro muscular re-education:  Pt. performed [facility] skills training to improve speed and dexterity needed for ADL tasks and writing. Pt. demonstrated grasping 1 inch sticks,  inch cylindrical collars, and  inch flat washers on the [place]due pegboard using bilateral hands. Pt. Worked on bilateral alternating hand movements to remove the sticks.  Selfcare:  Pt. worked on Building surveyor, and writing legibility when formulating cursive, and printed form. Pt. presented with 75% legibility for printed form, and 50% legibility for cursive form when writing lists of flowers, and months. Pt. Requires increased time to complete.                           OT Short Term Goals - 03/03/17 1459      OT SHORT TERM GOAL #1   Title  `        OT Long Term Goals - 04/09/18 1328      OT LONG TERM GOAL #2   Title  Pt. will complete self grooming with minA    Baseline  02/24/2018  - Pt is independent with oral care and washing his face and occasionally requires increased time if he is having a bad day. Pt's shaving is completed by his barber.    Time  12    Period  Weeks    Status  Achieved      OT LONG TERM GOAL #4   Title  Pt. will perform self dressing with minA and A/E as needed.    Baseline  02/24/2018 - Pt. continues to improve LB dressing techniques while seated. Pt required extensive time and mod A to complete LB dressing. Pt. requires min A for UB dressing.    Time  12    Period  Weeks    Status  On-going      OT LONG TERM GOAL #6   Title  Pt. will write his name efficiently with 100% legibility    Baseline  02/24/2018 - Pt to continue to work to increase legibility and efficiency when writing name to sign in when riding the [place]    Time  8    Period  Weeks    Status  On-going      OT LONG TERM  GOAL #7   Title  Pt will independently and consistently follow HEP to increase UE strength to increase functional independence    Baseline  02/24/2018 - Pt to continue to work towards independence with exercises    Time  6    Period  Weeks    Status  On-going            Plan - 04/14/18 1344    Clinical Impression Statement  Pt. conitnues to present with limited BUE strength, and [place] skillls which is impacting his ability to write legibly. Pt. presents with limited BUE strength, and [facility] skills. Pt. conitnues to work on improving UE strength, and [facility] skills in order to improve UE functioning during ADLs, IADLs, and writing tasks legibly.    Occupational performance deficits (Please refer to evaluation for details):  ADL's;IADL's    Rehab Potential  Fair    OT Frequency  2x / week    OT Duration  12 weeks    OT Treatment/Interventions  Self-care/ADL training;Patient/family education;DME and/or AE instruction;Therapeutic exercise;Moist Heat;Neuromuscular education    Clinical Decision Making  Several treatment options, min-mod task modification necessary     Consulted and Agree with Plan of Care  Patient       Patient will benefit from skilled therapeutic intervention in order to improve the following deficits and impairments:  Pain, Decreased cognition, Decreased activity tolerance, Impaired UE functional use, Decreased range of motion, Decreased coordination, Decreased endurance  Visit Diagnosis: Muscle weakness (generalized)  Other lack of coordination    Problem List Patient Active Problem List   Diagnosis Date Noted  . Acute CVA (cerebrovascular accident) ([place]) 05/02/2016  . Left-sided weakness 05/01/2016    Harrel Carina, MS, OTR/L 04/14/2018, 1:58 PM  [place] MAIN [facility] SERVICES [address] [place], [place], [address] Phone: [phone]   Fax:  [phone]  Name: Curtis Powell MRN: [id number] Date of Birth: 06/17/1965

## 2018-04-16 ENCOUNTER — Ambulatory Visit: Payer: Medicare HMO | Admitting: Occupational Therapy

## 2018-04-16 ENCOUNTER — Ambulatory Visit: Payer: Medicare HMO

## 2018-04-16 ENCOUNTER — Encounter: Payer: Self-pay | Admitting: Occupational Therapy

## 2018-04-16 DIAGNOSIS — R2689 Other abnormalities of gait and mobility: Secondary | ICD-10-CM

## 2018-04-16 DIAGNOSIS — R278 Other lack of coordination: Secondary | ICD-10-CM

## 2018-04-16 DIAGNOSIS — M6281 Muscle weakness (generalized): Secondary | ICD-10-CM | POA: Diagnosis not present

## 2018-04-16 DIAGNOSIS — I69351 Hemiplegia and hemiparesis following cerebral infarction affecting right dominant side: Secondary | ICD-10-CM

## 2018-04-16 NOTE — Therapy (Addendum)
[place] MAIN [facility] SERVICES [address] [place], [place], [address] Phone: [phone]   Fax:  [phone]  Occupational Therapy Treatment  Patient Details  Name: Curtis Powell MRN: [id number] Date of Birth: 09/09/1965 No data recorded  Encounter Date: 04/16/2018  OT End of Session - 04/16/18 1354    Visit Number  69    Number of Visits  2    Date for OT Re-Evaluation  07/02/18    Authorization Type  Visit 2 of 10 for progress reporting period starting 04/09/2018    OT Start Time  1345    OT Stop Time  1430    OT Time Calculation (min)  45 min    Activity Tolerance  Patient tolerated treatment well    Behavior During Therapy  [facility] for tasks assessed/performed       Past Medical History:  Diagnosis Date  . GERD (gastroesophageal reflux disease)   . Hyperlipidemia   . Hypertension   . Obesity   . Stroke [facility])     Past Surgical History:  Procedure Laterality Date  . BRAIN SURGERY      There were no vitals filed for this visit.  Subjective Assessment - 04/16/18 1352    Subjective   Pt. continues to present with limited UE strength and [facility] skills.    Pertinent History  Pt. is a 53 y.o. male who suffered a CVA on 05/01/2016. Pt. was admitted to the hospital. Once discharged, he received Home Health PT and OT services for about a month. Pt. has had multiple CVAs over the past 8 years, and has had multiple falls in the past 6 months. Pt. resides in an apartment. Pt. has caregivers for 80 hours. Pt.'s mother stays with pt. at night, and assists with IADL tasks.      Patient Stated Goals  To be able to throw a ball and dribble a ball, do as much as I can for myself.     Currently in Pain?  No/denies      OT TREATMENT    Neuro muscular re-education:  Pt. worked on improving [facility] skills grasping 1/2" marbles, storing them in the palm of his hand, and translatory skills. Pt. had difficulty formulating translatory movements with the right  hand. Pt. performed [facility] skills training to improve speed and dexterity needed for ADL tasks and writing. Pt. worked on grasping 1" inch sticks,  inch cylindrical collars, and  inch flat washers on the [place] pegboard with the right, and left hands. Pt. requires increased time to complete, and cues for proper technique.   Self-care:  Pt. performed toileting tasks with with CGA in standing using the grab bar. Pt. was able to perform clothing negotiation tasks with SBA. Pt. hand hygiene was SBA. Pt. Has been more steady on his feet during toileting tasks.                          OT Education - 04/16/18 1353    Education provided  Yes    Education Details  RUE Functioning    Person(s) Educated  Patient    Methods  Explanation;Demonstration    Comprehension  Verbalized understanding;Returned demonstration;Verbal cues required       OT Short Term Goals - 03/03/17 1459      OT SHORT TERM GOAL #1   Title  `        OT Long Term Goals - 04/09/18 1328  OT LONG TERM GOAL #2   Title  Pt. will complete self grooming with minA    Baseline  02/24/2018 - Pt is independent with oral care and washing his face and occasionally requires increased time if he is having a bad day. Pt's shaving is completed by his barber.    Time  12    Period  Weeks    Status  Achieved      OT LONG TERM GOAL #4   Title  Pt. will perform self dressing with minA and A/E as needed.    Baseline  02/24/2018 - Pt. continues to improve LB dressing techniques while seated. Pt required extensive time and mod A to complete LB dressing. Pt. requires min A for UB dressing.    Time  12    Period  Weeks    Status  On-going      OT LONG TERM GOAL #6   Title  Pt. will write his name efficiently with 100% legibility    Baseline  02/24/2018 - Pt to continue to work to increase legibility and efficiency when writing name to sign in when riding the [place]    Time  8    Period  Weeks    Status  On-going       OT LONG TERM GOAL #7   Title  Pt will independently and consistently follow HEP to increase UE strength to increase functional independence    Baseline  02/24/2018 - Pt to continue to work towards independence with exercises    Time  6    Period  Weeks    Status  On-going            Plan - 04/16/18 1355    Clinical Impression Statement Pt. continues to present with limited RUE strength, and [facility] skills. Pt. presents with limited RUE strength, and [facility] skills which make it difficult to perform LE dressing skills, and writing legibility. Pt. continues to work on these skills to work towards improving ADL, and IADL functioning.    Occupational performance deficits (Please refer to evaluation for details):  ADL's;IADL's    OT Frequency  2x / week    OT Duration  12 weeks    OT Treatment/Interventions  Self-care/ADL training;Patient/family education;DME and/or AE instruction;Therapeutic exercise;Moist Heat;Neuromuscular education    Clinical Decision Making  Several treatment options, min-mod task modification necessary    Consulted and Agree with Plan of Care  Patient       Patient will benefit from skilled therapeutic intervention in order to improve the following deficits and impairments:  Pain, Decreased cognition, Decreased activity tolerance, Impaired UE functional use, Decreased range of motion, Decreased coordination, Decreased endurance  Visit Diagnosis: Muscle weakness (generalized)  Other lack of coordination    Problem List Patient Active Problem List   Diagnosis Date Noted  . Acute CVA (cerebrovascular accident) ([place]) 05/02/2016  . Left-sided weakness 05/01/2016    Harrel Carina, MS, OTR/L 04/16/2018, 2:12 PM  [place] MAIN [facility] SERVICES [address] [place], [place], [address] Phone: [phone]   Fax:  [phone]  Name: Curtis Powell MRN: [id number] Date of Birth: May 28, 1965

## 2018-04-16 NOTE — Therapy (Signed)
[place] MAIN [facility] SERVICES [address] [place], [place], [address] Phone: [phone]   Fax:  [phone]  Physical Therapy Treatment  Patient Details  Name: Curtis Powell MRN: [id number] Date of Birth: Jul 14, 1965 No data recorded  Encounter Date: 04/16/2018  PT End of Session - 04/16/18 1308    Visit Number  93    Number of Visits  115    Date for PT Re-Evaluation  06/09/18    Authorization Type  Progress note: 5/10    PT Start Time  1259    PT Stop Time  1345    PT Time Calculation (min)  46 min    Equipment Utilized During Treatment  Gait belt    Activity Tolerance  Patient tolerated treatment well    Behavior During Therapy  WFL for tasks assessed/performed       Past Medical History:  Diagnosis Date  . GERD (gastroesophageal reflux disease)   . Hyperlipidemia   . Hypertension   . Obesity   . Stroke [facility])     Past Surgical History:  Procedure Laterality Date  . BRAIN SURGERY      There were no vitals filed for this visit.  Subjective Assessment - 04/16/18 1306    Subjective  Patient reports a new person came to pick him up today. No LOB or falls since last session. No pain reports     Pertinent History   Patient is a pleasant 53 year old male who presents to physical therapy for weakness and immobility secondary to CVA.  Had a stroke in Feb 2018. He was previously fully independent, but this stroke caused severe residual deficits, mainly on the right side as well as speech, and now he is unable to walk or perform most of his ADLs on his own. Entire right side is very weak. He still has a little difficulty with speech but his swallowing is improved to baseline. His mom had to move in with him and now is his main caregiver.     Limitations  Sitting;Lifting;Standing;Walking;House hold activities    How long can you sit comfortably?  5 minutes    How long can you stand comfortably?  5 minutes    How long can you walk  comfortably?  10 ft    Patient Stated Goals  Walk without walker, walk further with walker, get some strength in back     Currently in Pain?  No/denies       TREATMENT 5lb ankle weights: back away from back of chair for trunk stability challenge             Marching 2x15 each LE; verbal cueing for neutral alignment between knee and ankle, core activation for upright posture.              Long arc quad 5 second holds, 20x ; verbal cueing for core activation  BLE Seated GTB abduction 20x with emphasis on eccentric control/ slow return.      // bars:  Ambulation x2 laps around gym (~86 ft) with seated rest between reps. BUE support. CGA/Min A for maintaining pelvic and trunk position. VCs and TCs provided for gluteal activation. Frequent verbal cueing for upright head position for decreasing trunk flexion to promote more upright position for increased step length.    Seated glute sets between laps (5 sec hold x10x2) to encourage glute activation in standing  Seated TrA activation between laps ( 3 second hold x 20 x2 )  to encourage upright position                            PT Education - 04/16/18 1308    Education provided  Yes    Education Details  exercise technique, body mechanics, posture     Person(s) Educated  Patient    Methods  Explanation;Demonstration;Tactile cues;Verbal cues    Comprehension  Verbalized understanding;Returned demonstration;Tactile cues required;Need further instruction;Verbal cues required       PT Short Term Goals - 03/26/18 1553      PT SHORT TERM GOAL #1   Title  Patient will be independent in home exercise program to improve strength/mobility for better functional independence with ADLs.    Baseline  hep compliant    Time  2    Period  Weeks    Status  Achieved      PT SHORT TERM GOAL #2   Title  Patient will require min cueing for STS transfer with CGA for increased independence with mobility.     Baseline  CGA    Time   2    Period  Weeks    Status  Achieved      PT SHORT TERM GOAL #3   Title  Patient will maintain upright posture for > 15 seconds to demonstrate strengthened postural control muscualture     Baseline  18 seconds     Time  2    Period  Weeks    Status  Achieved        PT Long Term Goals - 03/26/18 1553      PT LONG TERM GOAL #1   Title  Patient will perform bed mobility with Supervision to increase independent mobility    Baseline  able to perform mod I for increased time , occasional Min A for scooting up in bed; 7/16: min guard for safety with increased effort and time, cues to scoot back in bed before sit>supine 9/12: can do it independently but with difficulty    Time  8    Period  Weeks    Status  Achieved      PT LONG TERM GOAL #2   Title  Patient (< 32 years old) will complete five times sit to stand test in < 10 seconds indicating an increased LE strength and improved balance.    Baseline  2/18: 30 seconds from plinth chair height single UE on seat and single on walker ; 32 seconds from plinth with single UE on seat and single on walker. 4/10: 26 seconds from [facility] with one hand on walker5/29: 22.2 seconds with one hand on walker one hand on WC; 7/16: one hand on walker one hand on WC 18.96 seconds; pt achieving 75% of full upright standing 8/22:one hand on walker one hand on WC 18 seconds; pt achieving 75% of full upright standing  9/12: 18 seconds econds; pt achieving 75% of full upright standing 8/22:one hand on walker one hand on WC 10/15: 17seconds; pt achieving 75% of full upright posture 11/7: 16 seconds; pt achieving 75 upright posture 12/19: 15 seconds 80% upright posture    Time  8    Period  Weeks    Status  Partially Met    Target Date  04/09/18      PT LONG TERM GOAL #3   Title  Patient will ambulate 40 ft with walker with no breaks to allow for increased mobility within home.  Baseline  12/26: 29 ft with walker and WC follow with no rest breaks; 2/18: multiple  trials previous session of >100 ft     Time  8    Period  Weeks    Status  Achieved      PT LONG TERM GOAL #4   Title  Patient will increase BLE gross strength to 4+/5 as to improve functional strength for independent gait, increased standing tolerance and increased ADL ability.    Baseline   4-/5 RLE, 10/15: 4/5 RLE 11/7: 4/5 RLE    Time  8    Period  Weeks    Status  Partially Met    Target Date  04/09/18      PT LONG TERM GOAL #5   Title  Patient will increase lower extremity functional scale to >40/80 to demonstrate improved functional mobility and increased tolerance with ADLs.     Baseline  14/80; 12/26: 21/80; 2/18: 28/80  4/10; 25/80 5/29: 27/80; 7/16: 29/80 8/22: 30/80  9/12: 26/80 (feeling more tired due to moving) 10/15: 37/80 11/7: 23/80    Time  8    Period  Weeks    Status  Partially Met    Target Date  04/09/18      PT LONG TERM GOAL #6   Title  Patient will walk through a door involving one small step up/down to increase functional independence.     Baseline  require CGA  BUE support in // bars at this time 10/15: required BUE and CGA in //bars    Time  8    Period  Weeks    Status  Deferred      PT LONG TERM GOAL #7   Title  Patient will ambulate 40 ft with least assistive device and Supervision with no breaks to allow for increased mobility within home.     Baseline  requires use of walker and CGA     Time  8    Period  Weeks    Status  Achieved      PT LONG TERM GOAL #8   Title  Patient will perform 10 MWT in < 1 minute to demonstrate improved ambulatory velocity and capacity.     Baseline  4/10: 2 minutes 45 seconds; 5/29: 2 minute 31 seconds; 3 minutes 22 seconds 8/22: 3 minutes 2 seconds  9/12:  2 minutes and 43 seconds 10/15: 63mnute 12 seconds 11/7: 1 minute 21 seconds 12/19: 1 min 16 seconds     Time  8    Period  Weeks    Status  Partially Met    Target Date  04/09/18      PT LONG TERM GOAL  #9   TITLE  Patient will increase Berg Balance score by  > 6 points (26/56) to demonstrate decreased fall risk during functional activities.    Baseline  9/12: 20/56 10/15: 21/56 11/7: 20/56 12/19: 22/56     Time  8    Period  Weeks    Status  Partially Met    Target Date  04/09/18      PT LONG TERM GOAL  #10   TITLE  Patient (< 691years old) will complete five times sit to stand test with full upright posture in < 30 seconds indicating an increased glute activation, LE strength and improved balance.    Baseline  10/15: 37seconds 11/7: 20 seconds     Time  8    Period  Weeks    Status  Achieved      PT LONG TERM GOAL  #11   TITLE  Patient will ambulate 100 ft with least assistive device and Supervision with no breaks to allow for increased mobility within home.     Baseline  10/17: 35f 11/7: 1069f   Time  8    Period  Weeks    Status  Achieved      PT LONG TERM GOAL  #12   TITLE  Patient (< [age]ears old) will complete five times sit to stand test with full upright posture in < 15 seconds indicating increased glute activation, LE strength and improved balance.    Baseline  11/7: 20 seconds 12/19: 18 seconds     Time  8    Period  Weeks    Status  Partially Met    Target Date  04/09/18      PT LONG TERM GOAL  #13   TITLE  Patient will ambulate 150 ft with least assistive device and Supervision with no breaks to allow for increased mobility within home.     Baseline  11/7: 10067f2/19: 115 ft     Time  8    Period  Weeks    Status  Partially Met    Target Date  04/09/18            Plan - 04/16/18 1336    Clinical Impression Statement  Patient demonstrates improved step length when ambulating with upright posture and tactile cueing to gluteals for propulsion. Patient has noted inversion of bilateral feet when fatigued ambulating that is not initially present at start of mobility. Patient demonstrating improved capacity for ambulation, ambulating ~160 ft with one seated rest break in which he performed seated interventions.  Patient will continue to benefit from skilled therapeutic intervention to address deficits in strength, activity tolerance, balance, and mobility for improved QOL and function.    Clinical Impairments Affecting Rehab Potential  hx of HTN, HLD, CVA, learning disability, Diabetes, brain tumor,     PT Frequency  2x / week    PT Duration  8 weeks    PT Treatment/Interventions  ADLs/Self Care Home Management;Aquatic Therapy;Ultrasound;Moist Heat;Traction;DME Instruction;Gait training;Stair training;Functional mobility training;Therapeutic activities;Therapeutic exercise;Orthotic Fit/Training;Neuromuscular re-education;Balance training;Patient/family education;Manual techniques;Wheelchair mobility training;Passive range of motion;Energy conservation;Taping;Visual/perceptual remediation/compensation    PT Next Visit Plan  balance and stability and ambulation, anterior weight shift hold    PT Home Exercise Plan  TrA contraction, glute sets    Consulted and Agree with Plan of Care  Patient       Patient will benefit from skilled therapeutic intervention in order to improve the following deficits and impairments:  Abnormal gait, Decreased activity tolerance, Decreased balance, Decreased knowledge of precautions, Decreased endurance, Decreased coordination, Decreased knowledge of use of DME, Decreased mobility, Decreased range of motion, Difficulty walking, Decreased safety awareness, Decreased strength, Impaired flexibility, Impaired perceived functional ability, Impaired tone, Postural dysfunction, Improper body mechanics, Pain  Visit Diagnosis: Muscle weakness (generalized)  Other lack of coordination  Other abnormalities of gait and mobility  Hemiplegia and hemiparesis following cerebral infarction affecting right dominant side ([facility]   Problem List Patient Active Problem List   Diagnosis Date Noted  . Acute CVA (cerebrovascular accident) ([place]/2018  . Left-sided weakness 05/01/2016    MarJanna ArchT, DPT   04/16/2018, 1:46 PM  [place] [address] [place]one: [phone]:  [phone]  Name: Curtis Powell MRN: [id number] Date of Birth: Jun 16, 1965

## 2018-04-21 ENCOUNTER — Ambulatory Visit: Payer: Medicare HMO

## 2018-04-21 ENCOUNTER — Ambulatory Visit: Payer: Medicare HMO | Admitting: Occupational Therapy

## 2018-04-23 ENCOUNTER — Encounter: Payer: Self-pay | Admitting: Occupational Therapy

## 2018-04-23 ENCOUNTER — Ambulatory Visit: Payer: Medicare HMO

## 2018-04-23 ENCOUNTER — Ambulatory Visit: Payer: Medicare HMO | Admitting: Occupational Therapy

## 2018-04-23 DIAGNOSIS — M6281 Muscle weakness (generalized): Secondary | ICD-10-CM | POA: Diagnosis not present

## 2018-04-23 DIAGNOSIS — R2689 Other abnormalities of gait and mobility: Secondary | ICD-10-CM

## 2018-04-23 DIAGNOSIS — I69351 Hemiplegia and hemiparesis following cerebral infarction affecting right dominant side: Secondary | ICD-10-CM

## 2018-04-23 DIAGNOSIS — R278 Other lack of coordination: Secondary | ICD-10-CM

## 2018-04-23 NOTE — Therapy (Signed)
[place] MAIN [facility] SERVICES [address] [place], [place], [address] Phone: [phone]   Fax:  [phone]  Occupational Therapy Treatment 48NI visit Note/Recertification Note  Patient Details  Name: Curtis Powell MRN: [id number] Date of Birth: [birth year] No data recorded  Encounter Date: 04/23/2018  OT End of Session - 04/23/18 1419    Visit Number  62    Number of Visits  113    Date for OT Re-Evaluation  07/16/18    Authorization Type  Progress reporting period starting 04/23/2018    OT Start Time  1345    OT Stop Time  1430    OT Time Calculation (min)  45 min    Equipment Utilized During Treatment  Long handled shoe horn    Activity Tolerance  Patient tolerated treatment well    Behavior During Therapy  WFL for tasks assessed/performed       Past Medical History:  Diagnosis Date  . GERD (gastroesophageal reflux disease)   . Hyperlipidemia   . Hypertension   . Obesity   . Stroke [facility])     Past Surgical History:  Procedure Laterality Date  . BRAIN SURGERY      There were no vitals filed for this visit.  Subjective Assessment - 04/23/18 1417    Subjective   Pt. reports that are going to start looking for a new apartment.    Patient Stated Goals  To be able to throw a ball and dribble a ball, do as much as I can for myself.     Currently in Pain?  No/denies       OT TREATMENT    Neuro muscular re-education:  Pt. worked on right hand [place] grasping, and manipulating 1/2" washers from a magnetic dish using point grasp pattern. Pt. worked on reaching up, stabilizing, and sustaining shoulder elevation while placing the washer over a small precise target on vertical dowels positioned at various angles. Pt. worked on grasping, and positioning magnetic hooks on a whiteboard positioned at a vertical angle on the tabletop. Pt. worked on [facility] skills grasping 1/2" flat washers, and placing them on the hooks. Pt. worked on [facility] with  reaching in order to be able to reach up into cabinetry, and closets to hang his clothes up.                        OT Education - 04/23/18 1419    Education provided  Yes    Education Details  RUE Functioning    Person(s) Educated  Patient    Methods  Explanation;Demonstration    Comprehension  Verbalized understanding;Returned demonstration;Verbal cues required       OT Short Term Goals - 03/03/17 1459      OT SHORT TERM GOAL #1   Title  `        OT Long Term Goals - 04/23/18 1421      OT LONG TERM GOAL #4   Title  Pt. will perform self dressing with minA and A/E as needed.    Baseline  Pt. continues to improve LB dressing techniques while seated. Pt required extensive time and mod A to complete LB dressing. Pt. requires min A for UB dressing.    Time  12    Period  Weeks    Status  On-going    Target Date  07/16/18      Long Term Additional Goals   Additional Long Term Goals  Yes      OT LONG TERM GOAL #6   Title  Pt. will write his name efficiently with 100% legibility    Baseline  04/23/2018: Pt to continues to work to increase legibility and efficiency when writing name to sign in when riding the [place]    Time  12    Period  Weeks    Status  On-going    Target Date  07/16/18      OT LONG TERM GOAL #7   Title  Pt will independently and consistently follow HEP to increase UE strength to increase functional independence    Baseline  04/23/2018: Pt to continues to work towards independence with exercises    Time  12    Period  Weeks    Status  On-going    Target Date  07/16/18      OT LONG TERM GOAL #8   Title  Pt. will require supervision ironing a shirt.    Baseline  04/23/2018: Pt. is unable to complete.    Time  12    Period  Weeks    Status  New    Target Date  07/16/18      OT LONG TERM GOAL  #9   Baseline  Pt. will independently be able to sew a button onto a shirt.    Time  12    Period  Weeks    Status  New    Target Date   07/16/18      OT LONG TERM GOAL  #10   TITLE  Pt. will independently be able to throw a ball    Baseline  04/23/2018: Pt. is unable.    Time  12    Period  Weeks    Status  New    Target Date  07/16/18      OT LONG TERM GOAL  #11   TITLE  Pt. will improve UE functional reaching to be able to independently use his RUE to hand his clothes in the closest.    Baseline  04/23/2018: Pt. is unable to hang his clothes up in his closet.    Time  12    Period  Weeks    Status  New    Target Date  07/16/18            Plan - 04/23/18 1444    Clinical Impression Statement  Pt. is making steady progress, however continues to present with limited UE strength, and [facility] skills. Pt. has difficulty with functional reaching into cabinetry, and closets, throwing a ball, ironing, and sewing. Pt. continues to work on improving BUE functioning in order to improve ADL, and IADL performance during ADLs, and IADLs.     Occupational performance deficits (Please refer to evaluation for details):  ADL's;IADL's    Rehab Potential  Fair    OT Frequency  2x / week    OT Duration  12 weeks    OT Treatment/Interventions  Self-care/ADL training;Patient/family education;DME and/or AE instruction;Therapeutic exercise;Moist Heat;Neuromuscular education    Clinical Decision Making  Several treatment options, min-mod task modification necessary    Consulted and Agree with Plan of Care  Patient       Patient will benefit from skilled therapeutic intervention in order to improve the following deficits and impairments:  Pain, Decreased cognition, Decreased activity tolerance, Impaired UE functional use, Decreased range of motion, Decreased coordination, Decreased endurance  Visit Diagnosis: Muscle weakness (generalized)  Other lack of coordination    Problem  List Patient Active Problem List   Diagnosis Date Noted  . Acute CVA (cerebrovascular accident) ([place]) 05/02/2016  . Left-sided weakness 05/01/2016     Harrel Carina, MS, OTR/L 04/23/2018, 3:00 PM  [place] MAIN [facility] SERVICES [address] [place], [place], [address] Phone: [phone]   Fax:  [phone]  Name: Curtis Powell MRN: [id number] Date of Birth: 1965/12/11

## 2018-04-23 NOTE — Therapy (Signed)
[place] MAIN [facility] SERVICES [address] [place], [place], [address] Phone: [phone]   Fax:  [phone]  Physical Therapy Treatment  Patient Details  Name: Curtis Powell MRN: [id number] Date of Birth: July 21, 1965 No data recorded  Encounter Date: 04/23/2018  PT End of Session - 04/23/18 1328    Visit Number  94    Number of Visits  115    Date for PT Re-Evaluation  06/09/18    Authorization Type  Progress note: 6/10    PT Start Time  1310    PT Stop Time  1345    PT Time Calculation (min)  35 min    Equipment Utilized During Treatment  Gait belt    Activity Tolerance  Patient tolerated treatment well    Behavior During Therapy  WFL for tasks assessed/performed       Past Medical History:  Diagnosis Date  . GERD (gastroesophageal reflux disease)   . Hyperlipidemia   . Hypertension   . Obesity   . Stroke [facility])     Past Surgical History:  Procedure Laterality Date  . BRAIN SURGERY      There were no vitals filed for this visit.  Subjective Assessment - 04/23/18 1327    Subjective  Patient arrived late to session due to new person transporting him. Was able to walk to the kitchen from his bedroom with his aide earlier today. No falls or LOB since last session.     Pertinent History   Patient is a pleasant 53 year old male who presents to physical therapy for weakness and immobility secondary to CVA.  Had a stroke in Feb 2018. He was previously fully independent, but this stroke caused severe residual deficits, mainly on the right side as well as speech, and now he is unable to walk or perform most of his ADLs on his own. Entire right side is very weak. He still has a little difficulty with speech but his swallowing is improved to baseline. His mom had to move in with him and now is his main caregiver.     Limitations  Sitting;Lifting;Standing;Walking;House hold activities    How long can you sit comfortably?  5 minutes    How  long can you stand comfortably?  5 minutes    How long can you walk comfortably?  10 ft    Patient Stated Goals  Walk without walker, walk further with walker, get some strength in back     Currently in Pain?  No/denies       TREATMENT   Ambulation x2laps around gym (~86 ft) with seated rest between reps. BUE support. CGA/Min A for maintaining pelvic and trunk position. VCs and TCs provided for gluteal activation.Frequent verbal cueing for upright head position for decreasing trunk flexion to promote more upright position for increased step length.  Negotiating obstacles throughout lap of equipment, other patients, and turns.    Seated glute sets between laps (5 sec hold x15x2) to encourage glute activation in standing  Seated TrA activation between laps ( 3 second hold x 15 x2 ) to encourage upright position    Green theraband seated after ambulation:  Adduction with PT holding other end 15x each LE, cueing for foot placement to reduce compensation  Abduction with assist to provide stabilization at feet to prevent compensatory patterning 15x each LE.    Kicking soccer ball: focus on coordination and muscle tissue response, 10x each LE,   Bathroom mobility: CGA and  set up provided for patient for toileting, hand washing, and transfers. No instability noted however required assistance bringing pants back up due to limited reach in standing.                     PT Education - 04/23/18 1328    Education provided  Yes    Education Details  exercise technique, body mechanics, posture     Person(s) Educated  Patient    Methods  Explanation;Demonstration;Tactile cues;Verbal cues    Comprehension  Verbalized understanding;Returned demonstration;Tactile cues required;Need further instruction;Verbal cues required       PT Short Term Goals - 03/26/18 1553      PT SHORT TERM GOAL #1   Title  Patient will be independent in home exercise program to improve  strength/mobility for better functional independence with ADLs.    Baseline  hep compliant    Time  2    Period  Weeks    Status  Achieved      PT SHORT TERM GOAL #2   Title  Patient will require min cueing for STS transfer with CGA for increased independence with mobility.     Baseline  CGA    Time  2    Period  Weeks    Status  Achieved      PT SHORT TERM GOAL #3   Title  Patient will maintain upright posture for > 15 seconds to demonstrate strengthened postural control muscualture     Baseline  18 seconds     Time  2    Period  Weeks    Status  Achieved        PT Long Term Goals - 03/26/18 1553      PT LONG TERM GOAL #1   Title  Patient will perform bed mobility with Supervision to increase independent mobility    Baseline  able to perform mod I for increased time , occasional Min A for scooting up in bed; 7/16: min guard for safety with increased effort and time, cues to scoot back in bed before sit>supine 9/12: can do it independently but with difficulty    Time  8    Period  Weeks    Status  Achieved      PT LONG TERM GOAL #2   Title  Patient (< 50 years old) will complete five times sit to stand test in < 10 seconds indicating an increased LE strength and improved balance.    Baseline  2/18: 30 seconds from plinth chair height single UE on seat and single on walker ; 32 seconds from plinth with single UE on seat and single on walker. 4/10: 26 seconds from [facility] with one hand on walker5/29: 22.2 seconds with one hand on walker one hand on WC; 7/16: one hand on walker one hand on WC 18.96 seconds; pt achieving 75% of full upright standing 8/22:one hand on walker one hand on WC 18 seconds; pt achieving 75% of full upright standing  9/12: 18 seconds econds; pt achieving 75% of full upright standing 8/22:one hand on walker one hand on WC 10/15: 17seconds; pt achieving 75% of full upright posture 11/7: 16 seconds; pt achieving 75 upright posture 12/19: 15 seconds 80% upright posture     Time  8    Period  Weeks    Status  Partially Met    Target Date  04/09/18      PT LONG TERM GOAL #3   Title  Patient will  ambulate 40 ft with walker with no breaks to allow for increased mobility within home.     Baseline  12/26: 29 ft with walker and WC follow with no rest breaks; 2/18: multiple trials previous session of >100 ft     Time  8    Period  Weeks    Status  Achieved      PT LONG TERM GOAL #4   Title  Patient will increase BLE gross strength to 4+/5 as to improve functional strength for independent gait, increased standing tolerance and increased ADL ability.    Baseline   4-/5 RLE, 10/15: 4/5 RLE 11/7: 4/5 RLE    Time  8    Period  Weeks    Status  Partially Met    Target Date  04/09/18      PT LONG TERM GOAL #5   Title  Patient will increase lower extremity functional scale to >40/80 to demonstrate improved functional mobility and increased tolerance with ADLs.     Baseline  14/80; 12/26: 21/80; 2/18: 28/80  4/10; 25/80 5/29: 27/80; 7/16: 29/80 8/22: 30/80  9/12: 26/80 (feeling more tired due to moving) 10/15: 37/80 11/7: 23/80    Time  8    Period  Weeks    Status  Partially Met    Target Date  04/09/18      PT LONG TERM GOAL #6   Title  Patient will walk through a door involving one small step up/down to increase functional independence.     Baseline  require CGA  BUE support in // bars at this time 10/15: required BUE and CGA in //bars    Time  8    Period  Weeks    Status  Deferred      PT LONG TERM GOAL #7   Title  Patient will ambulate 40 ft with least assistive device and Supervision with no breaks to allow for increased mobility within home.     Baseline  requires use of walker and CGA     Time  8    Period  Weeks    Status  Achieved      PT LONG TERM GOAL #8   Title  Patient will perform 10 MWT in < 1 minute to demonstrate improved ambulatory velocity and capacity.     Baseline  4/10: 2 minutes 45 seconds; 5/29: 2 minute 31 seconds; 3 minutes 22  seconds 8/22: 3 minutes 2 seconds  9/12:  2 minutes and 43 seconds 10/15: 46mnute 12 seconds 11/7: 1 minute 21 seconds 12/19: 1 min 16 seconds     Time  8    Period  Weeks    Status  Partially Met    Target Date  04/09/18      PT LONG TERM GOAL  #9   TITLE  Patient will increase Berg Balance score by > 6 points (26/56) to demonstrate decreased fall risk during functional activities.    Baseline  9/12: 20/56 10/15: 21/56 11/7: 20/56 12/19: 22/56     Time  8    Period  Weeks    Status  Partially Met    Target Date  04/09/18      PT LONG TERM GOAL  #10   TITLE  Patient (< 646years old) will complete five times sit to stand test with full upright posture in < 30 seconds indicating an increased glute activation, LE strength and improved balance.    Baseline  10/15: 37seconds 11/7: 20  seconds     Time  8    Period  Weeks    Status  Achieved      PT LONG TERM GOAL  #11   TITLE  Patient will ambulate 100 ft with least assistive device and Supervision with no breaks to allow for increased mobility within home.     Baseline  10/17: 75f 11/7: 103f   Time  8    Period  Weeks    Status  Achieved      PT LONG TERM GOAL  #12   TITLE  Patient (< [age]ears old) will complete five times sit to stand test with full upright posture in < 15 seconds indicating increased glute activation, LE strength and improved balance.    Baseline  11/7: 20 seconds 12/19: 18 seconds     Time  8    Period  Weeks    Status  Partially Met    Target Date  04/09/18      PT LONG TERM GOAL  #13   TITLE  Patient will ambulate 150 ft with least assistive device and Supervision with no breaks to allow for increased mobility within home.     Baseline  11/7: 100108f2/19: 115 ft     Time  8    Period  Weeks    Status  Partially Met    Target Date  04/09/18            Plan - 04/23/18 1352    Clinical Impression Statement  Patient's session limited by late arrival time. Patient demonstrated improved ability to  negotiate obstacles in the room with visual scanning while ambulating however does continue to require tactile cueing for upright position and support. Patient's response and timing when sitting is improving, not missing a single kick each LE. Patient will continue to benefit from skilled therapeutic intervention to address deficits in strength, activity tolerance, balance, and mobility for improved QOL and function.    Clinical Impairments Affecting Rehab Potential  hx of HTN, HLD, CVA, learning disability, Diabetes, brain tumor,     PT Frequency  2x / week    PT Duration  8 weeks    PT Treatment/Interventions  ADLs/Self Care Home Management;Aquatic Therapy;Ultrasound;Moist Heat;Traction;DME Instruction;Gait training;Stair training;Functional mobility training;Therapeutic activities;Therapeutic exercise;Orthotic Fit/Training;Neuromuscular re-education;Balance training;Patient/family education;Manual techniques;Wheelchair mobility training;Passive range of motion;Energy conservation;Taping;Visual/perceptual remediation/compensation    PT Next Visit Plan  balance and stability and ambulation, anterior weight shift hold    PT Home Exercise Plan  TrA contraction, glute sets    Consulted and Agree with Plan of Care  Patient       Patient will benefit from skilled therapeutic intervention in order to improve the following deficits and impairments:  Abnormal gait, Decreased activity tolerance, Decreased balance, Decreased knowledge of precautions, Decreased endurance, Decreased coordination, Decreased knowledge of use of DME, Decreased mobility, Decreased range of motion, Difficulty walking, Decreased safety awareness, Decreased strength, Impaired flexibility, Impaired perceived functional ability, Impaired tone, Postural dysfunction, Improper body mechanics, Pain  Visit Diagnosis: Muscle weakness (generalized)  Other lack of coordination  Other abnormalities of gait and mobility  Hemiplegia and  hemiparesis following cerebral infarction affecting right dominant side ([facility]   Problem List Patient Active Problem List   Diagnosis Date Noted  . Acute CVA (cerebrovascular accident) ([place]CCWindfall [place]1/25/2018  . Left-sided weakness 05/01/2016   MarJanna ArchT, DPT   04/23/2018, 1:53 PM  [place] [facility]St [facility]RV[facility] [address]  [place], [place], [address] Phone: [phone]   Fax:  [phone]  Name: Curtis Powell MRN: [id number] Date of Birth: 1965-08-11

## 2018-04-28 ENCOUNTER — Ambulatory Visit: Payer: Medicare HMO

## 2018-04-28 ENCOUNTER — Ambulatory Visit: Payer: Medicare HMO | Admitting: Occupational Therapy

## 2018-04-28 ENCOUNTER — Encounter: Payer: Self-pay | Admitting: Occupational Therapy

## 2018-04-28 DIAGNOSIS — R278 Other lack of coordination: Secondary | ICD-10-CM

## 2018-04-28 DIAGNOSIS — M6281 Muscle weakness (generalized): Secondary | ICD-10-CM

## 2018-04-28 DIAGNOSIS — R2689 Other abnormalities of gait and mobility: Secondary | ICD-10-CM

## 2018-04-28 DIAGNOSIS — I69351 Hemiplegia and hemiparesis following cerebral infarction affecting right dominant side: Secondary | ICD-10-CM

## 2018-04-28 NOTE — Therapy (Signed)
[place] MAIN [facility] SERVICES [address] [place], [place], [address] Phone: [phone]   Fax:  [phone]  Physical Therapy Treatment  Patient Details  Name: Curtis Powell MRN: [id number] Date of Birth: [birth year] No data recorded  Encounter Date: 04/28/2018  PT End of Session - 04/28/18 1307    Visit Number  95    Number of Visits  115    Date for PT Re-Evaluation  06/09/18    Authorization Type  Progress note: 7/10    PT Start Time  1346    PT Stop Time  1430    PT Time Calculation (min)  44 min    Equipment Utilized During Treatment  Gait belt    Activity Tolerance  Patient tolerated treatment well    Behavior During Therapy  WFL for tasks assessed/performed       Past Medical History:  Diagnosis Date  . GERD (gastroesophageal reflux disease)   . Hyperlipidemia   . Hypertension   . Obesity   . Stroke [facility])     Past Surgical History:  Procedure Laterality Date  . BRAIN SURGERY      There were no vitals filed for this visit.  Subjective Assessment - 04/28/18 1351    Subjective  Patient presents with Left knee pain that began this morning. States it hurts off and on.     Pertinent History   Patient is a pleasant 53 year old male who presents to physical therapy for weakness and immobility secondary to CVA.  Had a stroke in Feb 2018. He was previously fully independent, but this stroke caused severe residual deficits, mainly on the right side as well as speech, and now he is unable to walk or perform most of his ADLs on his own. Entire right side is very weak. He still has a little difficulty with speech but his swallowing is improved to baseline. His mom had to move in with him and now is his main caregiver.     Limitations  Sitting;Lifting;Standing;Walking;House hold activities    How long can you sit comfortably?  5 minutes    How long can you stand comfortably?  5 minutes    How long can you walk comfortably?  10 ft    Patient Stated Goals  Walk without walker, walk further with walker, get some strength in back     Currently in Pain?  Yes    Pain Score  5     Pain Location  Knee    Pain Orientation  Left    Pain Descriptors / Indicators  Sharp    Pain Type  Acute pain    Pain Radiating Towards  pain is under patella on L side    Pain Onset  Today    Pain Frequency  Intermittent      TREATMENT Therapeutic Exercise: exercises performed with icepack on L knee for pain control. Exercises focus on LE and core strengthening for improved stability and mobility.   Standing: marches with RW and CGA verbal and tactile cues for upright posture. Increased L knee pain, terminated after 10   Seated glute sets  (5 sec hold x15x2) to encourage glute activation in standing   Seated TrA activation ( 5 second hold x 15 x2 ) to encourage upright position    Green theraband seated             Adduction with PT holding other end 15x each LE, cueing for foot placement to  reduce compensation             Abduction with assist to provide stabilization at feet to prevent compensatory patterning 15x each LE.   Seated: passing balloon inside and outside BOS with upright posture challenging trunk and trunk stability  5lb ankle weights seated:  Marching/hip flexion: 15x each LE cues for abdominal activation and keeping back away from back of chair  LAQ 10x each LE, focus on slow movement up an down for eccentric control.   Seated pf: 20x; tactile assistance to keep L foot in neutral alignment. For pre-gait strengthening  Seated df: 20x each LE with focus on neutral foot alignment for pre gait strengthening  Bathroom mobility: CGA and set up provided for patient for toileting, hand washing, and transfers. No instability noted however required assistance bringing pants back up due to limited reach in standing.                            PT Education - 04/28/18 1307    Education provided  Yes     Education Details  exercise technique, body mechanics, posture     Person(s) Educated  Patient    Methods  Explanation;Demonstration;Tactile cues;Verbal cues    Comprehension  Verbalized understanding;Returned demonstration;Verbal cues required;Tactile cues required       PT Short Term Goals - 03/26/18 1553      PT SHORT TERM GOAL #1   Title  Patient will be independent in home exercise program to improve strength/mobility for better functional independence with ADLs.    Baseline  hep compliant    Time  2    Period  Weeks    Status  Achieved      PT SHORT TERM GOAL #2   Title  Patient will require min cueing for STS transfer with CGA for increased independence with mobility.     Baseline  CGA    Time  2    Period  Weeks    Status  Achieved      PT SHORT TERM GOAL #3   Title  Patient will maintain upright posture for > 15 seconds to demonstrate strengthened postural control muscualture     Baseline  18 seconds     Time  2    Period  Weeks    Status  Achieved        PT Long Term Goals - 03/26/18 1553      PT LONG TERM GOAL #1   Title  Patient will perform bed mobility with Supervision to increase independent mobility    Baseline  able to perform mod I for increased time , occasional Min A for scooting up in bed; 7/16: min guard for safety with increased effort and time, cues to scoot back in bed before sit>supine 9/12: can do it independently but with difficulty    Time  8    Period  Weeks    Status  Achieved      PT LONG TERM GOAL #2   Title  Patient (< 19 years old) will complete five times sit to stand test in < 10 seconds indicating an increased LE strength and improved balance.    Baseline  2/18: 30 seconds from plinth chair height single UE on seat and single on walker ; 32 seconds from plinth with single UE on seat and single on walker. 4/10: 26 seconds from [place] with one hand on walker5/29: 22.2 seconds with one hand  on walker one hand on WC; 7/16: one hand on walker  one hand on WC 18.96 seconds; pt achieving 75% of full upright standing 8/22:one hand on walker one hand on WC 18 seconds; pt achieving 75% of full upright standing  9/12: 18 seconds econds; pt achieving 75% of full upright standing 8/22:one hand on walker one hand on WC 10/15: 17seconds; pt achieving 75% of full upright posture 11/7: 16 seconds; pt achieving 75 upright posture 12/19: 15 seconds 80% upright posture    Time  8    Period  Weeks    Status  Partially Met    Target Date  04/09/18      PT LONG TERM GOAL #3   Title  Patient will ambulate 40 ft with walker with no breaks to allow for increased mobility within home.     Baseline  12/26: 29 ft with walker and WC follow with no rest breaks; 2/18: multiple trials previous session of >100 ft     Time  8    Period  Weeks    Status  Achieved      PT LONG TERM GOAL #4   Title  Patient will increase BLE gross strength to 4+/5 as to improve functional strength for independent gait, increased standing tolerance and increased ADL ability.    Baseline   4-/5 RLE, 10/15: 4/5 RLE 11/7: 4/5 RLE    Time  8    Period  Weeks    Status  Partially Met    Target Date  04/09/18      PT LONG TERM GOAL #5   Title  Patient will increase lower extremity functional scale to >40/80 to demonstrate improved functional mobility and increased tolerance with ADLs.     Baseline  14/80; 12/26: 21/80; 2/18: 28/80  4/10; 25/80 5/29: 27/80; 7/16: 29/80 8/22: 30/80  9/12: 26/80 (feeling more tired due to moving) 10/15: 37/80 11/7: 23/80    Time  8    Period  Weeks    Status  Partially Met    Target Date  04/09/18      PT LONG TERM GOAL #6   Title  Patient will walk through a door involving one small step up/down to increase functional independence.     Baseline  require CGA  BUE support in // bars at this time 10/15: required BUE and CGA in //bars    Time  8    Period  Weeks    Status  Deferred      PT LONG TERM GOAL #7   Title  Patient will ambulate 40 ft  with least assistive device and Supervision with no breaks to allow for increased mobility within home.     Baseline  requires use of walker and CGA     Time  8    Period  Weeks    Status  Achieved      PT LONG TERM GOAL #8   Title  Patient will perform 10 MWT in < 1 minute to demonstrate improved ambulatory velocity and capacity.     Baseline  4/10: 2 minutes 45 seconds; 5/29: 2 minute 31 seconds; 3 minutes 22 seconds 8/22: 3 minutes 2 seconds  9/12:  2 minutes and 43 seconds 10/15: 41mnute 12 seconds 11/7: 1 minute 21 seconds 12/19: 1 min 16 seconds     Time  8    Period  Weeks    Status  Partially Met    Target Date  04/09/18  PT LONG TERM GOAL  #9   TITLE  Patient will increase Berg Balance score by > 6 points (26/56) to demonstrate decreased fall risk during functional activities.    Baseline  9/12: 20/56 10/15: 21/56 11/7: 20/56 12/19: 22/56     Time  8    Period  Weeks    Status  Partially Met    Target Date  04/09/18      PT LONG TERM GOAL  #10   TITLE  Patient (< 25 years old) will complete five times sit to stand test with full upright posture in < 30 seconds indicating an increased glute activation, LE strength and improved balance.    Baseline  10/15: 37seconds 11/7: 20 seconds     Time  8    Period  Weeks    Status  Achieved      PT LONG TERM GOAL  #11   TITLE  Patient will ambulate 100 ft with least assistive device and Supervision with no breaks to allow for increased mobility within home.     Baseline  10/17: 50f 11/7: 108f   Time  8    Period  Weeks    Status  Achieved      PT LONG TERM GOAL  #12   TITLE  Patient (< 6069ears old) will complete five times sit to stand test with full upright posture in < 15 seconds indicating increased glute activation, LE strength and improved balance.    Baseline  11/7: 20 seconds 12/19: 18 seconds     Time  8    Period  Weeks    Status  Partially Met    Target Date  04/09/18      PT LONG TERM GOAL  #13   TITLE   Patient will ambulate 150 ft with least assistive device and Supervision with no breaks to allow for increased mobility within home.     Baseline  11/7: 10048f2/19: 115 ft     Time  8    Period  Weeks    Status  Partially Met    Target Date  04/09/18            Plan - 04/28/18 1408    Clinical Impression Statement  Patient presents with L knee pain that is exacerbated in standing/weightbearing. Due to this ambulation was deferred for this session and a focus on strengthening and posture performed. Patient will continue to benefit from skilled therapeutic intervention to address deficits in strength, activity tolerance, balance, and mobility for improved QOL and function    Clinical Impairments Affecting Rehab Potential  hx of HTN, HLD, CVA, learning disability, Diabetes, brain tumor,     PT Frequency  2x / week    PT Duration  8 weeks    PT Treatment/Interventions  ADLs/Self Care Home Management;Aquatic Therapy;Ultrasound;Moist Heat;Traction;DME Instruction;Gait training;Stair training;Functional mobility training;Therapeutic activities;Therapeutic exercise;Orthotic Fit/Training;Neuromuscular re-education;Balance training;Patient/family education;Manual techniques;Wheelchair mobility training;Passive range of motion;Energy conservation;Taping;Visual/perceptual remediation/compensation    PT Next Visit Plan  balance and stability and ambulation, anterior weight shift hold    PT Home Exercise Plan  TrA contraction, glute sets    Consulted and Agree with Plan of Care  Patient       Patient will benefit from skilled therapeutic intervention in order to improve the following deficits and impairments:  Abnormal gait, Decreased activity tolerance, Decreased balance, Decreased knowledge of precautions, Decreased endurance, Decreased coordination, Decreased knowledge of use of DME, Decreased mobility, Decreased range of motion, Difficulty  walking, Decreased safety awareness, Decreased strength,  Impaired flexibility, Impaired perceived functional ability, Impaired tone, Postural dysfunction, Improper body mechanics, Pain  Visit Diagnosis: Muscle weakness (generalized)  Other lack of coordination  Other abnormalities of gait and mobility  Hemiplegia and hemiparesis following cerebral infarction affecting right dominant side [facility])     Problem List Patient Active Problem List   Diagnosis Date Noted  . Acute CVA (cerebrovascular accident) ([place]) 05/02/2016  . Left-sided weakness 05/01/2016   Janna Arch, PT, DPT   04/28/2018, 2:32 PM  [place] MAIN [facility] SERVICES [address] [place], [place], [address] Phone: [phone]   Fax:  [phone]  Name: Curtis Powell MRN: [id number] Date of Birth: 12-23-1965

## 2018-04-28 NOTE — Therapy (Signed)
[name] [facility] SERVICES [address] [place], [place], [address] Phone: [phone]   Fax:  [phone]  Occupational Therapy Treatment  Patient Details  Name: Curtis Powell MRN: [id number] Date of Birth: 1965/12/15 No data recorded  Encounter Date: 04/28/2018  OT End of Session - 04/28/18 1307    Visit Number  99    Number of Visits  113    Date for OT Re-Evaluation  07/16/18    Authorization Type  Progress reporting period starting 04/23/2018    OT Start Time  1300    OT Stop Time  1345    OT Time Calculation (min)  45 min    Equipment Utilized During Treatment  Long handled shoe horn    Behavior During Therapy  WFL for tasks assessed/performed       Past Medical History:  Diagnosis Date  . GERD (gastroesophageal reflux disease)   . Hyperlipidemia   . Hypertension   . Obesity   . Stroke [facility])     Past Surgical History:  Procedure Laterality Date  . BRAIN SURGERY      There were no vitals filed for this visit.  Subjective Assessment - 04/28/18 1306    Subjective   Pt.  reports his mother found a new apartment.    Pertinent History  Pt. is a 53 y.o. male who suffered a CVA on 05/01/2016. Pt. was admitted to the hospital. Once discharged, he received Home Health PT and OT services for about a month. Pt. has had multiple CVAs over the past 8 years, and has had multiple falls in the past 6 months. Pt. resides in an apartment. Pt. has caregivers for 80 hours. Pt.'s mother stays with pt. at night, and assists with IADL tasks.      Patient Stated Goals  To be able to throw a ball and dribble a ball, do as much as I can for myself.     Currently in Pain?  No/denies      OT TREATMENT    Neuro muscular re-education:  Pt. worked on grasping coins with his right hand from a tabletop surface, worked on translatory movements of the hand, sorted, and stacked them on a tabletop surface. Pt. dropped several coins when working on  translatory movements of the hand.  Pt. worked on placing them into a resistive container, and pushing them through the slot while isolating his 2nd digit. Pt. worked on [facility] tasks in order manipulate items during ADLs, and IADLs.  Therapeutic Exercise:  Pt. worked on RUE AROM using the shape tower. Pt. worked on moving the shapes through 3 vertical rungs. Pt. compensated by leaning to the left when reaching with the right, requiring cues for upright midline sitting. Pt. was able to reach the 3rd rung, however was unable to manipulate it onto the very top. Pt. was able to independently remove the shapes over the top of the 3rd rung. Pt. worked on reaching tasks with the RUE to be able to reach into cabinets, and closets.                          OT Education - 04/28/18 1307    Education provided  Yes    Education Details  RUE Functioning    Person(s) Educated  Patient    Methods  Explanation;Demonstration    Comprehension  Verbalized understanding;Returned demonstration;Verbal cues required       OT Short Term Goals -  03/03/17 1459      OT SHORT TERM GOAL #1   Title  `        OT Long Term Goals - 04/23/18 1421      OT LONG TERM GOAL #4   Title  Pt. will perform self dressing with minA and A/E as needed.    Baseline  Pt. continues to improve LB dressing techniques while seated. Pt required extensive time and mod A to complete LB dressing. Pt. requires min A for UB dressing.    Time  12    Period  Weeks    Status  On-going    Target Date  07/16/18      Long Term Additional Goals   Additional Long Term Goals  Yes      OT LONG TERM GOAL #6   Title  Pt. will write his name efficiently with 100% legibility    Baseline  04/23/2018: Pt to continues to work to increase legibility and efficiency when writing name to sign in when riding the [place]    Time  12    Period  Weeks    Status  On-going    Target Date  07/16/18      OT LONG TERM GOAL #7   Title  Pt will  independently and consistently follow HEP to increase UE strength to increase functional independence    Baseline  04/23/2018: Pt to continues to work towards independence with exercises    Time  12    Period  Weeks    Status  On-going    Target Date  07/16/18      OT LONG TERM GOAL #8   Title  Pt. will require supervision ironing a shirt.    Baseline  04/23/2018: Pt. is unable to complete.    Time  12    Period  Weeks    Status  New    Target Date  07/16/18      OT LONG TERM GOAL  #9   Baseline  Pt. will independently be able to sew a button onto a shirt.    Time  12    Period  Weeks    Status  New    Target Date  07/16/18      OT LONG TERM GOAL  #10   TITLE  Pt. will independently be able to throw a ball    Baseline  04/23/2018: Pt. is unable.    Time  12    Period  Weeks    Status  New    Target Date  07/16/18      OT LONG TERM GOAL  #11   TITLE  Pt. will improve UE functional reaching to be able to independently use his RUE to hand his clothes in the closest.    Baseline  04/23/2018: Pt. is unable to hang his clothes up in his closet.    Time  12    Period  Weeks    Status  New    Target Date  07/16/18            Plan - 04/28/18 1308    Clinical Impression Statement  Pt. continues to make steady progress with RUE reaching. Pt. continues to work on improving UE strength, and [facility] skills in order to be able to reach into cabinetry, and closets, perform ironing, and throwing a ball.    Occupational performance deficits (Please refer to evaluation for details):  ADL's;IADL's    Rehab Potential  Fair  OT Frequency  2x / week    OT Duration  12 weeks    OT Treatment/Interventions  Self-care/ADL training;Patient/family education;DME and/or AE instruction;Therapeutic exercise;Moist Heat;Neuromuscular education    Clinical Decision Making  Several treatment options, min-mod task modification necessary    Consulted and Agree with Plan of Care  Patient       Patient  will benefit from skilled therapeutic intervention in order to improve the following deficits and impairments:  Pain, Decreased cognition, Decreased activity tolerance, Impaired UE functional use, Decreased range of motion, Decreased coordination, Decreased endurance  Visit Diagnosis: Muscle weakness (generalized)  Other lack of coordination    Problem List Patient Active Problem List   Diagnosis Date Noted  . Acute CVA (cerebrovascular accident) ([place]) 05/02/2016  . Left-sided weakness 05/01/2016    Harrel Carina, MS, OTR/L 04/28/2018, 1:16 PM  [place] MAIN [facility] SERVICES [address] [place], [place], [address] Phone: [phone]   Fax:  [phone]  Name: JONCARLO FRIBERG MRN: [id number] Date of Birth: June 21, 1965

## 2018-04-30 ENCOUNTER — Encounter: Payer: Self-pay | Admitting: Physical Therapy

## 2018-04-30 ENCOUNTER — Ambulatory Visit: Payer: Medicare HMO | Admitting: Occupational Therapy

## 2018-04-30 ENCOUNTER — Encounter: Payer: Self-pay | Admitting: Occupational Therapy

## 2018-04-30 ENCOUNTER — Ambulatory Visit: Payer: Medicare HMO | Admitting: Physical Therapy

## 2018-04-30 DIAGNOSIS — I69351 Hemiplegia and hemiparesis following cerebral infarction affecting right dominant side: Secondary | ICD-10-CM

## 2018-04-30 DIAGNOSIS — R278 Other lack of coordination: Secondary | ICD-10-CM

## 2018-04-30 DIAGNOSIS — R2689 Other abnormalities of gait and mobility: Secondary | ICD-10-CM

## 2018-04-30 DIAGNOSIS — M6281 Muscle weakness (generalized): Secondary | ICD-10-CM

## 2018-04-30 NOTE — Therapy (Signed)
[place] MAIN [facility] SERVICES [address] [place], [place], [address] Phone: [phone]   Fax:  [phone]  Occupational Therapy Treatment  Patient Details  Name: Curtis Powell MRN: [id number] Date of Birth: [birth year] No data recorded  Encounter Date: 04/30/2018  OT End of Session - 04/30/18 1313    Visit Number  82    Number of Visits  113    Date for OT Re-Evaluation  07/16/18    Authorization Type  Progress reporting period starting 04/23/2018    OT Start Time  1305    OT Stop Time  1345    OT Time Calculation (min)  40 min    Equipment Utilized During Treatment  Long handled shoe horn    Activity Tolerance  Patient tolerated treatment well    Behavior During Therapy  WFL for tasks assessed/performed       Past Medical History:  Diagnosis Date  . GERD (gastroesophageal reflux disease)   . Hyperlipidemia   . Hypertension   . Obesity   . Stroke [facility])     Past Surgical History:  Procedure Laterality Date  . BRAIN SURGERY      There were no vitals filed for this visit.  Subjective Assessment - 04/30/18 1312    Subjective   Pt. plans to move sometime in February.    Pertinent History  Pt. is a 53 y.o. male who suffered a CVA on 05/01/2016. Pt. was admitted to the hospital. Once discharged, he received Home Health PT and OT services for about a month. Pt. has had multiple CVAs over the past 8 years, and has had multiple falls in the past 6 months. Pt. resides in an apartment. Pt. has caregivers for 80 hours. Pt.'s mother stays with pt. at night, and assists with IADL tasks.      Patient Stated Goals  To be able to throw a ball and dribble a ball, do as much as I can for myself.     Currently in Pain?  No/denies       OT TREATMENT    Therapeutic Exercise:  Pt. worked on RUE ROM grasping large flat shapes, and moving them through 3 rung heights of vertical dowels. Pt. was able to completely place them on and off the first 3  rungs.   Self-care:  Pt. worked on ironing pants, a short sleeve shirt, and a long sleeve shirt using a tabletop ironing board. Pt. education was provided about positioning of the board, positioning of the clothing on the board, and the set-up of the iron, and the board. Pt. required increased time to complete.                        OT Education - 04/30/18 1313    Education provided  Yes    Education Details  RUE Functioning    Person(s) Educated  Patient    Methods  Explanation;Demonstration    Comprehension  Verbalized understanding;Returned demonstration;Verbal cues required       OT Short Term Goals - 03/03/17 1459      OT SHORT TERM GOAL #1   Title  `        OT Long Term Goals - 04/23/18 1421      OT LONG TERM GOAL #4   Title  Pt. will perform self dressing with minA and A/E as needed.    Baseline  Pt. continues to improve LB dressing techniques while seated. Pt  required extensive time and mod A to complete LB dressing. Pt. requires min A for UB dressing.    Time  12    Period  Weeks    Status  On-going    Target Date  07/16/18      Long Term Additional Goals   Additional Long Term Goals  Yes      OT LONG TERM GOAL #6   Title  Pt. will write his name efficiently with 100% legibility    Baseline  04/23/2018: Pt to continues to work to increase legibility and efficiency when writing name to sign in when riding the [place]    Time  12    Period  Weeks    Status  On-going    Target Date  07/16/18      OT LONG TERM GOAL #7   Title  Pt will independently and consistently follow HEP to increase UE strength to increase functional independence    Baseline  04/23/2018: Pt to continues to work towards independence with exercises    Time  12    Period  Weeks    Status  On-going    Target Date  07/16/18      OT LONG TERM GOAL #8   Title  Pt. will require supervision ironing a shirt.    Baseline  04/23/2018: Pt. is unable to complete.    Time  12     Period  Weeks    Status  New    Target Date  07/16/18      OT LONG TERM GOAL  #9   Baseline  Pt. will independently be able to sew a button onto a shirt.    Time  12    Period  Weeks    Status  New    Target Date  07/16/18      OT LONG TERM GOAL  #10   TITLE  Pt. will independently be able to throw a ball    Baseline  04/23/2018: Pt. is unable.    Time  12    Period  Weeks    Status  New    Target Date  07/16/18      OT LONG TERM GOAL  #11   TITLE  Pt. will improve UE functional reaching to be able to independently use his RUE to hand his clothes in the closest.    Baseline  04/23/2018: Pt. is unable to hang his clothes up in his closet.    Time  12    Period  Weeks    Status  New    Target Date  07/16/18            Plan - 04/30/18 1315    Clinical Impression Statement Pt. has made progress overall. Pt. continues to present with limited UE strength, and [facility] skills. Pt. continues to work on improvng UE functional reaching with the RUE in order to improve, and promote independence with self-dressing, writing legibility, sewing, and ironing.   Occupational performance deficits (Please refer to evaluation for details):  ADL's;IADL's    Rehab Potential  Fair    OT Frequency  2x / week    OT Duration  12 weeks    OT Treatment/Interventions  Self-care/ADL training;Patient/family education;DME and/or AE instruction;Therapeutic exercise;Moist Heat;Neuromuscular education    Clinical Decision Making  Several treatment options, min-mod task modification necessary    Consulted and Agree with Plan of Care  Patient       Patient will benefit from skilled  therapeutic intervention in order to improve the following deficits and impairments:  Pain, Decreased cognition, Decreased activity tolerance, Impaired UE functional use, Decreased range of motion, Decreased coordination, Decreased endurance  Visit Diagnosis: Muscle weakness (generalized)  Other lack of coordination    Problem  List Patient Active Problem List   Diagnosis Date Noted  . Acute CVA (cerebrovascular accident) ([place]) 05/02/2016  . Left-sided weakness 05/01/2016    Harrel Carina, MS, OTR/L 04/30/2018, 1:22 PM  [place] MAIN [facility] SERVICES [address] [place], [place], [address] Phone: [phone]   Fax:  [phone]  Name: Curtis Powell MRN: [id number] Date of Birth: 23-Sep-1965

## 2018-04-30 NOTE — Therapy (Signed)
[place] [facility] MAIN REHAB SERVICES [address] [place], [place], [address] Phone: [phone]   Fax:  [phone]  Physical Therapy Treatment  Patient Details  Name: Curtis Powell MRN: [id number] Date of Birth: 07/31/1965 No data recorded  Encounter Date: 04/30/2018  PT End of Session - 04/30/18 1357    Visit Number  96    Number of Visits  115    Date for PT Re-Evaluation  06/09/18    Authorization Type  Progress note: 8/10    PT Start Time  0145    PT Stop Time  0225    PT Time Calculation (min)  40 min    Equipment Utilized During Treatment  Gait belt    Activity Tolerance  Patient tolerated treatment well    Behavior During Therapy  WFL for tasks assessed/performed       Past Medical History:  Diagnosis Date  . GERD (gastroesophageal reflux disease)   . Hyperlipidemia   . Hypertension   . Obesity   . Stroke (HCC)     Past Surgical History:  Procedure Laterality Date  . BRAIN SURGERY      There were no vitals filed for this visit.  Subjective Assessment - 04/30/18 1341    Subjective  Patient presents with no knee pain.     Pertinent History   Patient is a pleasant 53 year old male who presents to physical therapy for weakness and immobility secondary to CVA.  Had a stroke in Feb 2018. He was previously fully independent, but this stroke caused severe residual deficits, mainly on the right side as well as speech, and now he is unable to walk or perform most of his ADLs on his own. Entire right side is very weak. He still has a little difficulty with speech but his swallowing is improved to baseline. His mom had to move in with him and now is his main caregiver.     Limitations  Sitting;Lifting;Standing;Walking;House hold activities    How long can you sit comfortably?  5 minutes    How long can you stand comfortably?  5 minutes    How long can you walk comfortably?  10 ft    Patient Stated Goals  Walk without walker, walk further  with walker, get some strength in back     Currently in Pain?  No/denies    Pain Score  0-No pain    Pain Onset  Today      Treatment: Gait training with RW 66 feet and 106 feet  and CGA , wc following on level surface with vc for posture. Patient has very little knee flex and has increased lateral sway during swing phase of gait bilaterally. Transfer sit to stand from wc level with one UE on wc arm and the other on RW with CGA and 2 attempts, VC for correct sequencing  Therapeutic exercise; LAQ with 3 lbs x 10 x 2 with 3 sec hold Marching BLE x 10 x 2  Knee flex with RTB x 20 x 2 sets BLE  Hip abd/Er with GTB x 20 x 2    CGA and Min to mod verbal cues used throughout with increased lateral  sway and during gait due to inability to flex knees..   Pt educated throughout session about proper posture and technique with exercises. Improved exercise technique, movement at target joints, use of target muscles after min to mod verbal, visual, tactile cues.                        PT Education - 04/30/18 1347    Education provided  Yes    Education Details  HEP    Person(s) Educated  Patient    Methods  Explanation    Comprehension  Verbalized understanding;Returned demonstration;Need further instruction       PT Short Term Goals - 03/26/18 1553      PT SHORT TERM GOAL #1   Title  Patient will be independent in home exercise program to improve strength/mobility for better functional independence with ADLs.    Baseline  hep compliant    Time  2    Period  Weeks    Status  Achieved      PT SHORT TERM GOAL #2   Title  Patient will require min cueing for STS transfer with CGA for increased independence with mobility.     Baseline  CGA    Time  2    Period  Weeks    Status  Achieved      PT SHORT TERM GOAL #3   Title  Patient will maintain upright posture for > 15 seconds to demonstrate strengthened postural control muscualture     Baseline  18 seconds     Time  2     Period  Weeks    Status  Achieved        PT Long Term Goals - 03/26/18 1553      PT LONG TERM GOAL #1   Title  Patient will perform bed mobility with Supervision to increase independent mobility    Baseline  able to perform mod I for increased time , occasional Min A for scooting up in bed; 7/16: min guard for safety with increased effort and time, cues to scoot back in bed before sit>supine 9/12: can do it independently but with difficulty    Time  8    Period  Weeks    Status  Achieved      PT LONG TERM GOAL #2   Title  Patient (< 73 years old) will complete five times sit to stand test in < 10 seconds indicating an increased LE strength and improved balance.    Baseline  2/18: 30 seconds from plinth chair height single UE on seat and single on walker ; 32 seconds from plinth with single UE on seat and single on walker. 4/10: 26 seconds from [facility] with one hand on walker5/29: 22.2 seconds with one hand on walker one hand on WC; 7/16: one hand on walker one hand on WC 18.96 seconds; pt achieving 75% of full upright standing 8/22:one hand on walker one hand on WC 18 seconds; pt achieving 75% of full upright standing  9/12: 18 seconds econds; pt achieving 75% of full upright standing 8/22:one hand on walker one hand on WC 10/15: 17seconds; pt achieving 75% of full upright posture 11/7: 16 seconds; pt achieving 75 upright posture 12/19: 15 seconds 80% upright posture    Time  8    Period  Weeks    Status  Partially Met    Target Date  04/09/18      PT LONG TERM GOAL #3   Title  Patient will ambulate 40 ft with walker with no breaks to allow for increased mobility within home.     Baseline  12/26: 29 ft with walker and WC follow with no rest breaks; 2/18: multiple trials previous session of >100 ft     Time  8    Period  Weeks    Status  Achieved      PT LONG TERM GOAL #4   Title  Patient will increase BLE gross strength to 4+/5 as to improve functional strength for independent gait,  increased standing tolerance and increased ADL ability.    Baseline   4-/5 RLE, 10/15: 4/5 RLE 11/7: 4/5 RLE    Time  8    Period  Weeks    Status  Partially Met    Target Date  04/09/18      PT LONG TERM GOAL #5   Title  Patient will increase lower extremity functional scale to >40/80 to demonstrate improved functional mobility and increased tolerance with ADLs.     Baseline  14/80; 12/26: 21/80; 2/18: 28/80  4/10; 25/80 5/29: 27/80; 7/16: 29/80 8/22: 30/80  9/12: 26/80 (feeling more tired due to moving) 10/15: 37/80 11/7: 23/80    Time  8    Period  Weeks    Status  Partially Met    Target Date  04/09/18      PT LONG TERM GOAL #6   Title  Patient will walk through a door involving one small step up/down to increase functional independence.     Baseline  require CGA  BUE support in // bars at this time 10/15: required BUE and CGA in //bars    Time  8    Period  Weeks    Status  Deferred      PT LONG TERM GOAL #7   Title  Patient will ambulate 40 ft with least assistive device and Supervision with no breaks to allow for increased mobility within home.     Baseline  requires use of walker and CGA     Time  8    Period  Weeks    Status  Achieved      PT LONG TERM GOAL #8   Title  Patient will perform 10 MWT in < 1 minute to demonstrate improved ambulatory velocity and capacity.     Baseline  4/10: 2 minutes 45 seconds; 5/29: 2 minute 31 seconds; 3 minutes 22 seconds 8/22: 3 minutes 2 seconds  9/12:  2 minutes and 43 seconds 10/15: 13mnute 12 seconds 11/7: 1 minute 21 seconds 12/19: 1 min 16 seconds     Time  8    Period  Weeks    Status  Partially Met    Target Date  04/09/18      PT LONG TERM GOAL  #9   TITLE  Patient will increase Berg Balance score by > 6 points (26/56) to demonstrate decreased fall risk during functional activities.    Baseline  9/12: 20/56 10/15: 21/56 11/7: 20/56 12/19: 22/56     Time  8    Period  Weeks    Status  Partially Met    Target Date  04/09/18       PT LONG TERM GOAL  #10   TITLE  Patient (< 727years old) will complete five times sit to stand test with full upright posture in < 30 seconds indicating an increased glute activation, LE strength and improved balance.    Baseline  10/15: 37seconds 11/7: 20 seconds     Time  8    Period  Weeks    Status  Achieved      PT LONG TERM GOAL  #11   TITLE  Patient will ambulate 100 ft with least assistive device and Supervision with no breaks to allow for increased mobility within home.  Baseline  10/17: 85f 11/7: 1082f   Time  8    Period  Weeks    Status  Achieved      PT LONG TERM GOAL  #12   TITLE  Patient (< 604ears old) will complete five times sit to stand test with full upright posture in < 15 seconds indicating increased glute activation, LE strength and improved balance.    Baseline  11/7: 20 seconds 12/19: 18 seconds     Time  8    Period  Weeks    Status  Partially Met    Target Date  04/09/18      PT LONG TERM GOAL  #13   TITLE  Patient will ambulate 150 ft with least assistive device and Supervision with no breaks to allow for increased mobility within home.     Baseline  11/7: 10074f2/19: 115 ft     Time  8    Period  Weeks    Status  Partially Met    Target Date  04/09/18            Plan - 04/30/18 1357    Clinical Impression Statement  Patient performs gait with RW on level surface with turns , he has increased gait speed and better conrol of LE foot placement. He will conitnue to benefit from skilled PT to improve mobility.    Rehab Potential  Fair    Clinical Impairments Affecting Rehab Potential  hx of HTN, HLD, CVA, learning disability, Diabetes, brain tumor,     PT Frequency  2x / week    PT Duration  8 weeks    PT Treatment/Interventions  ADLs/Self Care Home Management;Aquatic Therapy;Ultrasound;Moist Heat;Traction;DME Instruction;Gait training;Stair training;Functional mobility training;Therapeutic activities;Therapeutic exercise;Orthotic  Fit/Training;Neuromuscular re-education;Balance training;Patient/family education;Manual techniques;Wheelchair mobility training;Passive range of motion;Energy conservation;Taping;Visual/perceptual remediation/compensation    PT Next Visit Plan  balance and stability and ambulation, anterior weight shift hold    PT Home Exercise Plan  TrA contraction, glute sets    Consulted and Agree with Plan of Care  Patient       Patient will benefit from skilled therapeutic intervention in order to improve the following deficits and impairments:  Abnormal gait, Decreased activity tolerance, Decreased balance, Decreased knowledge of precautions, Decreased endurance, Decreased coordination, Decreased knowledge of use of DME, Decreased mobility, Decreased range of motion, Difficulty walking, Decreased safety awareness, Decreased strength, Impaired flexibility, Impaired perceived functional ability, Impaired tone, Postural dysfunction, Improper body mechanics, Pain  Visit Diagnosis: Muscle weakness (generalized)  Other lack of coordination  Other abnormalities of gait and mobility  Hemiplegia and hemiparesis following [facility]   Problem List Patient Active Problem List   Diagnosis Date Noted  . Acute CVA (cerebrovascular accident) ([place]CC[place]1/25/2018  . Left-sided weakness 05/01/2016    ManAlanson Puls DPT 04/30/2018, 1:59 PM  [place] [facility] [address] [place]one: [phone]:  [phone]: BorALOYSIOUS VANGIESONN: [id number] of Birth: 9/1January 20, 1967

## 2018-05-05 ENCOUNTER — Encounter: Payer: Self-pay | Admitting: Occupational Therapy

## 2018-05-05 ENCOUNTER — Ambulatory Visit: Payer: Medicare HMO | Admitting: Physical Therapy

## 2018-05-05 ENCOUNTER — Encounter: Payer: Self-pay | Admitting: Physical Therapy

## 2018-05-05 ENCOUNTER — Ambulatory Visit: Payer: Medicare HMO | Admitting: Occupational Therapy

## 2018-05-05 DIAGNOSIS — M6281 Muscle weakness (generalized): Secondary | ICD-10-CM

## 2018-05-05 DIAGNOSIS — R2689 Other abnormalities of gait and mobility: Secondary | ICD-10-CM

## 2018-05-05 DIAGNOSIS — R278 Other lack of coordination: Secondary | ICD-10-CM

## 2018-05-05 DIAGNOSIS — I69351 Hemiplegia and hemiparesis following cerebral infarction affecting right dominant side: Secondary | ICD-10-CM

## 2018-05-05 NOTE — Therapy (Signed)
[place] MAIN [facility] SERVICES [address] [place], [place], [address] Phone: [phone]   Fax:  [phone]  Occupational Therapy Treatment  Patient Details  Name: Curtis Powell MRN: [id number] Date of Birth: 1965-07-01 No data recorded  Encounter Date: 05/05/2018  OT End of Session - 05/05/18 1309    Visit Number  45    Number of Visits  113    Date for OT Re-Evaluation  07/16/18    Authorization Type  Progress reporting period starting 04/23/2018    OT Start Time  1300    OT Stop Time  1345    OT Time Calculation (min)  45 min    Equipment Utilized During Treatment  Long handled shoe horn    Activity Tolerance  Patient tolerated treatment well    Behavior During Therapy  WFL for tasks assessed/performed       Past Medical History:  Diagnosis Date  . GERD (gastroesophageal reflux disease)   . Hyperlipidemia   . Hypertension   . Obesity   . Stroke [facility])     Past Surgical History:  Procedure Laterality Date  . BRAIN SURGERY      There were no vitals filed for this visit.  Subjective Assessment - 05/05/18 1307    Subjective   Pt. reports that he will go through the whole summer in his apartment, and move into a new place in August.    Pertinent History  Pt. is a 53 y.o. male who suffered a CVA on 05/01/2016. Pt. was admitted to the hospital. Once discharged, he received Home Health PT and OT services for about a month. Pt. has had multiple CVAs over the past 8 years, and has had multiple falls in the past 6 months. Pt. resides in an apartment. Pt. has caregivers for 80 hours. Pt.'s mother stays with pt. at night, and assists with IADL tasks.      Patient Stated Goals  To be able to throw a ball and dribble a ball, do as much as I can for myself.     Currently in Pain?  No/denies      OT TREATMENT    Therapeutic Exercise:  Pt. worked on RUE ROM grasping large flat shapes, and moving them through 3 rung heights of vertical  dowels. Pt. was able to completely place them on and off the first 3 rungs. Pt. Presented with less leaning when reaching, and was able to remain in midline.  Selfcare:  Pt. worked on basic sewing skills using medium sized thread, and needle.  Pt. worked on a basic single stitch. Pt. continues to need more practice with creating a straight single  Stitch. Pt. required verbal cues, and visual demonstration for proper technique with the stitch, hand placement when the needle is piercing the fabric. Pt. Worked on theses tasks In preparation for being able to sew a button on pants.                         OT Education - 05/05/18 1309    Education provided  Yes    Education Details  RUE Functioning    Person(s) Educated  Patient    Methods  Explanation;Demonstration    Comprehension  Verbalized understanding;Returned demonstration;Verbal cues required       OT Short Term Goals - 03/03/17 1459      OT SHORT TERM GOAL #1   Title  `  OT Long Term Goals - 04/23/18 1421      OT LONG TERM GOAL #4   Title  Pt. will perform self dressing with minA and A/E as needed.    Baseline  Pt. continues to improve LB dressing techniques while seated. Pt required extensive time and mod A to complete LB dressing. Pt. requires min A for UB dressing.    Time  12    Period  Weeks    Status  On-going    Target Date  07/16/18      Long Term Additional Goals   Additional Long Term Goals  Yes      OT LONG TERM GOAL #6   Title  Pt. will write his name efficiently with 100% legibility    Baseline  04/23/2018: Pt to continues to work to increase legibility and efficiency when writing name to sign in when riding the [place]    Time  12    Period  Weeks    Status  On-going    Target Date  07/16/18      OT LONG TERM GOAL #7   Title  Pt will independently and consistently follow HEP to increase UE strength to increase functional independence    Baseline  04/23/2018: Pt to continues to  work towards independence with exercises    Time  12    Period  Weeks    Status  On-going    Target Date  07/16/18      OT LONG TERM GOAL #8   Title  Pt. will require supervision ironing a shirt.    Baseline  04/23/2018: Pt. is unable to complete.    Time  12    Period  Weeks    Status  New    Target Date  07/16/18      OT LONG TERM GOAL  #9   Baseline  Pt. will independently be able to sew a button onto a shirt.    Time  12    Period  Weeks    Status  New    Target Date  07/16/18      OT LONG TERM GOAL  #10   TITLE  Pt. will independently be able to throw a ball    Baseline  04/23/2018: Pt. is unable.    Time  12    Period  Weeks    Status  New    Target Date  07/16/18      OT LONG TERM GOAL  #11   TITLE  Pt. will improve UE functional reaching to be able to independently use his RUE to hand his clothes in the closest.    Baseline  04/23/2018: Pt. is unable to hang his clothes up in his closet.    Time  12    Period  Weeks    Status  New    Target Date  07/16/18            Plan - 05/05/18 1310    Clinical Impression Statement  Pt. reports limited access to an iron, and ironing board at home is a barrier for him to practice,, and follow through with ironing at home. Pt. is making steady progress. Pt. continues to present with limited RUE strength, impaired functional reaching, and impaired [facility] skills. Pt. continues to work on improving these skills in order to be able to iron, reach into closets, and sew a button.      Occupational performance deficits (Please refer to evaluation for details):  ADL's;IADL's    Rehab Potential  Fair    OT Frequency  2x / week    OT Duration  12 weeks    OT Treatment/Interventions  Self-care/ADL training;Patient/family education;DME and/or AE instruction;Therapeutic exercise;Moist Heat;Neuromuscular education    Clinical Decision Making  Several treatment options, min-mod task modification necessary    Consulted and Agree with Plan of  Care  Patient       Patient will benefit from skilled therapeutic intervention in order to improve the following deficits and impairments:  Pain, Decreased cognition, Decreased activity tolerance, Impaired UE functional use, Decreased range of motion, Decreased coordination, Decreased endurance  Visit Diagnosis: Muscle weakness (generalized)  Other lack of coordination    Problem List Patient Active Problem List   Diagnosis Date Noted  . Acute CVA (cerebrovascular accident) ([place]) 05/02/2016  . Left-sided weakness 05/01/2016    Harrel Carina, MS, OTR/L 05/05/2018, 3:35 PM  [place] MAIN [facility] SERVICES [address] [place], [place], [address] Phone: [phone]   Fax:  [phone]  Name: KINCAID TIGER MRN: [id number] Date of Birth: [birth year]

## 2018-05-05 NOTE — Therapy (Signed)
[place] MAIN [facility] SERVICES [address] [place], [place], [address] Phone: [phone]   Fax:  [phone]  Physical Therapy Treatment  Patient Details  Name: Curtis Powell MRN: [id number] Date of Birth: [birth year] No data recorded  Encounter Date: 05/05/2018  PT End of Session - 05/05/18 1317    Visit Number  97    Number of Visits  115    Date for PT Re-Evaluation  06/09/18    Authorization Type  Progress note: 9/10    PT Start Time  0145    PT Stop Time  0230    PT Time Calculation (min)  45 min    Equipment Utilized During Treatment  Gait belt    Activity Tolerance  Patient tolerated treatment well    Behavior During Therapy  WFL for tasks assessed/performed       Past Medical History:  Diagnosis Date  . GERD (gastroesophageal reflux disease)   . Hyperlipidemia   . Hypertension   . Obesity   . Stroke [facility])     Past Surgical History:  Procedure Laterality Date  . BRAIN SURGERY      There were no vitals filed for this visit.  Subjective Assessment - 05/05/18 1417    Subjective  Patient presents with no knee pain, no complaints and doing well today.    Pertinent History   Patient is a pleasant 53 year old male who presents to physical therapy for weakness and immobility secondary to CVA.  Had a stroke in Feb 2018. He was previously fully independent, but this stroke caused severe residual deficits, mainly on the right side as well as speech, and now he is unable to walk or perform most of his ADLs on his own. Entire right side is very weak. He still has a little difficulty with speech but his swallowing is improved to baseline. His mom had to move in with him and now is his main caregiver.     Limitations  Sitting;Lifting;Standing;Walking;House hold activities    How long can you sit comfortably?  5 minutes    How long can you stand comfortably?  5 minutes    How long can you walk comfortably?  10 ft    Patient Stated Goals   Walk without walker, walk further with walker, get some strength in back     Currently in Pain?  No/denies    Pain Score  0-No pain    Pain Onset  Today       Treatment  Gait training with RW 160 feet  and CGA , wc following on level surface with vc for posture. Patient has very little knee flex and has increased lateral sway during swing phase of gait bilaterally. Transfer sit to stand from wc level with one UE on wc arm and the other on RW with CGA and 1 attempts, VC for correct sequencing  Transfer sit to stand from mat  level with one UE on mat and the other on RW with CGA  VC for correct sequencing   Therapeutic exercise LAQ with 3 lbs x 10 x 2 with 3 sec hold Marching BLE x 10 x 2  Knee flex with RTB x 20 x 2 sets BLE  Hip abd/Er with GTB x 20 x 2    CGA and Min to mod verbal cues used throughout with increased lateral  sway and during gait due to inability to flex knees. Patient has better step length bilaterally during gait.  Pt educated throughout session about proper posture and technique with exercises. Improved exercise technique, movement at target joints, use of target muscles after min to mod verbal, visual, tactile cues.                         PT Education - 05/05/18 1316    Education provided  Yes    Education Details  safety with mobility    Person(s) Educated  Patient    Methods  Explanation;Demonstration;Tactile cues;Verbal cues    Comprehension  Returned demonstration;Need further instruction       PT Short Term Goals - 03/26/18 1553      PT SHORT TERM GOAL #1   Title  Patient will be independent in home exercise program to improve strength/mobility for better functional independence with ADLs.    Baseline  hep compliant    Time  2    Period  Weeks    Status  Achieved      PT SHORT TERM GOAL #2   Title  Patient will require min cueing for STS transfer with CGA for increased independence with mobility.     Baseline  CGA    Time   2    Period  Weeks    Status  Achieved      PT SHORT TERM GOAL #3   Title  Patient will maintain upright posture for > 15 seconds to demonstrate strengthened postural control muscualture     Baseline  18 seconds     Time  2    Period  Weeks    Status  Achieved        PT Long Term Goals - 03/26/18 1553      PT LONG TERM GOAL #1   Title  Patient will perform bed mobility with Supervision to increase independent mobility    Baseline  able to perform mod I for increased time , occasional Min A for scooting up in bed; 7/16: min guard for safety with increased effort and time, cues to scoot back in bed before sit>supine 9/12: can do it independently but with difficulty    Time  8    Period  Weeks    Status  Achieved      PT LONG TERM GOAL #2   Title  Patient (< 11 years old) will complete five times sit to stand test in < 10 seconds indicating an increased LE strength and improved balance.    Baseline  2/18: 30 seconds from plinth chair height single UE on seat and single on walker ; 32 seconds from plinth with single UE on seat and single on walker. 4/10: 26 seconds from [facility] with one hand on walker5/29: 22.2 seconds with one hand on walker one hand on WC; 7/16: one hand on walker one hand on WC 18.96 seconds; pt achieving 75% of full upright standing 8/22:one hand on walker one hand on WC 18 seconds; pt achieving 75% of full upright standing  9/12: 18 seconds econds; pt achieving 75% of full upright standing 8/22:one hand on walker one hand on WC 10/15: 17seconds; pt achieving 75% of full upright posture 11/7: 16 seconds; pt achieving 75 upright posture 12/19: 15 seconds 80% upright posture    Time  8    Period  Weeks    Status  Partially Met    Target Date  04/09/18      PT LONG TERM GOAL #3   Title  Patient will ambulate 40  ft with walker with no breaks to allow for increased mobility within home.     Baseline  12/26: 29 ft with walker and WC follow with no rest breaks; 2/18: multiple  trials previous session of >100 ft     Time  8    Period  Weeks    Status  Achieved      PT LONG TERM GOAL #4   Title  Patient will increase BLE gross strength to 4+/5 as to improve functional strength for independent gait, increased standing tolerance and increased ADL ability.    Baseline   4-/5 RLE, 10/15: 4/5 RLE 11/7: 4/5 RLE    Time  8    Period  Weeks    Status  Partially Met    Target Date  04/09/18      PT LONG TERM GOAL #5   Title  Patient will increase lower extremity functional scale to >40/80 to demonstrate improved functional mobility and increased tolerance with ADLs.     Baseline  14/80; 12/26: 21/80; 2/18: 28/80  4/10; 25/80 5/29: 27/80; 7/16: 29/80 8/22: 30/80  9/12: 26/80 (feeling more tired due to moving) 10/15: 37/80 11/7: 23/80    Time  8    Period  Weeks    Status  Partially Met    Target Date  04/09/18      PT LONG TERM GOAL #6   Title  Patient will walk through a door involving one small step up/down to increase functional independence.     Baseline  require CGA  BUE support in // bars at this time 10/15: required BUE and CGA in //bars    Time  8    Period  Weeks    Status  Deferred      PT LONG TERM GOAL #7   Title  Patient will ambulate 40 ft with least assistive device and Supervision with no breaks to allow for increased mobility within home.     Baseline  requires use of walker and CGA     Time  8    Period  Weeks    Status  Achieved      PT LONG TERM GOAL #8   Title  Patient will perform 10 MWT in < 1 minute to demonstrate improved ambulatory velocity and capacity.     Baseline  4/10: 2 minutes 45 seconds; 5/29: 2 minute 31 seconds; 3 minutes 22 seconds 8/22: 3 minutes 2 seconds  9/12:  2 minutes and 43 seconds 10/15: 7mnute 12 seconds 11/7: 1 minute 21 seconds 12/19: 1 min 16 seconds     Time  8    Period  Weeks    Status  Partially Met    Target Date  04/09/18      PT LONG TERM GOAL  #9   TITLE  Patient will increase Berg Balance score by  > 6 points (26/56) to demonstrate decreased fall risk during functional activities.    Baseline  9/12: 20/56 10/15: 21/56 11/7: 20/56 12/19: 22/56     Time  8    Period  Weeks    Status  Partially Met    Target Date  04/09/18      PT LONG TERM GOAL  #10   TITLE  Patient (< 764years old) will complete five times sit to stand test with full upright posture in < 30 seconds indicating an increased glute activation, LE strength and improved balance.    Baseline  10/15: 37seconds 11/7: 20 seconds  Time  8    Period  Weeks    Status  Achieved      PT LONG TERM GOAL  #11   TITLE  Patient will ambulate 100 ft with least assistive device and Supervision with no breaks to allow for increased mobility within home.     Baseline  10/17: 9f 11/7: 1020f   Time  8    Period  Weeks    Status  Achieved      PT LONG TERM GOAL  #12   TITLE  Patient (< 605ears old) will complete five times sit to stand test with full upright posture in < 15 seconds indicating increased glute activation, LE strength and improved balance.    Baseline  11/7: 20 seconds 12/19: 18 seconds     Time  8    Period  Weeks    Status  Partially Met    Target Date  04/09/18      PT LONG TERM GOAL  #13   TITLE  Patient will ambulate 150 ft with least assistive device and Supervision with no breaks to allow for increased mobility within home.     Baseline  11/7: 10040f2/19: 115 ft     Time  8    Period  Weeks    Status  Partially Met    Target Date  04/09/18            Plan - 05/05/18 1319    Clinical Impression Statement  Patient performs gait with RW on level surface with turns, and mobility training with transfers from various surfaces.  He has increased gait speed and better conrol of LE foot placement. He will conitnue to benefit from skilled PT to improve mobility.    Rehab Potential  Fair    Clinical Impairments Affecting Rehab Potential  hx of HTN, HLD, CVA, learning disability, Diabetes, brain tumor,      PT Frequency  2x / week    PT Duration  8 weeks    PT Treatment/Interventions  ADLs/Self Care Home Management;Aquatic Therapy;Ultrasound;Moist Heat;Traction;DME Instruction;Gait training;Stair training;Functional mobility training;Therapeutic activities;Therapeutic exercise;Orthotic Fit/Training;Neuromuscular re-education;Balance training;Patient/family education;Manual techniques;Wheelchair mobility training;Passive range of motion;Energy conservation;Taping;Visual/perceptual remediation/compensation    PT Next Visit Plan  balance and stability and ambulation, anterior weight shift hold    PT Home Exercise Plan  TrA contraction, glute sets    Consulted and Agree with Plan of Care  Patient       Patient will benefit from skilled therapeutic intervention in order to improve the following deficits and impairments:  Abnormal gait, Decreased activity tolerance, Decreased balance, Decreased knowledge of precautions, Decreased endurance, Decreased coordination, Decreased knowledge of use of DME, Decreased mobility, Decreased range of motion, Difficulty walking, Decreased safety awareness, Decreased strength, Impaired flexibility, Impaired perceived functional ability, Impaired tone, Postural dysfunction, Improper body mechanics, Pain  Visit Diagnosis: Muscle weakness (generalized)  Other lack of coordination  Other abnormalities of gait and mobility  Hemiplegia and hemiparesis following cerebral infarction affecting right dominant side ([facility]   Problem List Patient Active Problem List   Diagnosis Date Noted  . Acute CVA (cerebrovascular accident) (HCCMelba1/25/2018  . Left-sided weakness 05/01/2016    ManAlanson PulsT VirginiaT 05/05/2018, 2:17 PM  Con[place] Island[place] REHGastrointestinal [facility] LLCRVICES [id number] [place]one: [phone]:  [phone]: Curtis HORNBACKN: [id number] of Birth: 9/129-Jan-1967

## 2018-05-07 ENCOUNTER — Ambulatory Visit: Payer: Medicare HMO | Admitting: Occupational Therapy

## 2018-05-07 ENCOUNTER — Ambulatory Visit: Payer: Medicare HMO

## 2018-05-07 ENCOUNTER — Encounter: Payer: Self-pay | Admitting: Occupational Therapy

## 2018-05-07 DIAGNOSIS — R2689 Other abnormalities of gait and mobility: Secondary | ICD-10-CM

## 2018-05-07 DIAGNOSIS — M6281 Muscle weakness (generalized): Secondary | ICD-10-CM

## 2018-05-07 DIAGNOSIS — R278 Other lack of coordination: Secondary | ICD-10-CM

## 2018-05-07 NOTE — Therapy (Signed)
[place] MAIN [facility] SERVICES [address] [place], [place], [address] Phone: [phone]   Fax:  [phone]  Occupational Therapy Treatment  Patient Details  Name: Curtis Powell MRN: [id number] Date of Birth: Jun 28, 1965 No data recorded  Encounter Date: 05/07/2018  OT End of Session - 05/07/18 1311    Visit Number  26    Number of Visits  113    Date for OT Re-Evaluation  07/16/18    Authorization Type  Progress reporting period starting 04/23/2018    OT Start Time  1300    OT Stop Time  1345    OT Time Calculation (min)  45 min    Equipment Utilized During Treatment  Long handled shoe horn    Activity Tolerance  Patient tolerated treatment well    Behavior During Therapy  WFL for tasks assessed/performed       Past Medical History:  Diagnosis Date  . GERD (gastroesophageal reflux disease)   . Hyperlipidemia   . Hypertension   . Obesity   . Stroke [facility])     Past Surgical History:  Procedure Laterality Date  . BRAIN SURGERY      There were no vitals filed for this visit.  Subjective Assessment - 05/07/18 1308    Subjective   Pt. reports that he is doing well today.    Pertinent History  Pt. is a 53 y.o. male who suffered a CVA on 05/01/2016. Pt. was admitted to the hospital. Once discharged, he received Home Health PT and OT services for about a month. Pt. has had multiple CVAs over the past 8 years, and has had multiple falls in the past 6 months. Pt. resides in an apartment. Pt. has caregivers for 80 hours. Pt.'s mother stays with pt. at night, and assists with IADL tasks.      Patient Stated Goals  To be able to throw a ball and dribble a ball, do as much as I can for myself.     Currently in Pain?  No/denies      OT Treatment    Therapeutic Exercise:  Pt. worked on RUE ROM grasping large flat shapes, and moving them through 3 rung heights of vertical dowels. Pt. was able to completely place them on and off the first 3  rungs. Pt. Required rest breaks, and cues for upright midline posture.  Self-care:  Pt. worked on ironing pants, and a long sleeve shirt using a [employer]. Pt. education was provided about positioning of the board, positioning of the clothing on the board, and the set-up of the iron, and the board, as well as placing pants on hangers after ironing. Pt. required increased time to complete. Pt. could benefit from conitnued practice ironing long sleeve shirts, and placing them onto a hanger.                        OT Education - 05/07/18 1311    Education provided  Yes    Education Details  RUE Functioning    Person(s) Educated  Patient    Methods  Explanation;Demonstration    Comprehension  Verbalized understanding;Returned demonstration;Verbal cues required       OT Short Term Goals - 03/03/17 1459      OT SHORT TERM GOAL #1   Title  `        OT Long Term Goals - 04/23/18 1421      OT LONG TERM GOAL #4  Title  Pt. will perform self dressing with minA and A/E as needed.    Baseline  Pt. continues to improve LB dressing techniques while seated. Pt required extensive time and mod A to complete LB dressing. Pt. requires min A for UB dressing.    Time  12    Period  Weeks    Status  On-going    Target Date  07/16/18      Long Term Additional Goals   Additional Long Term Goals  Yes      OT LONG TERM GOAL #6   Title  Pt. will write his name efficiently with 100% legibility    Baseline  04/23/2018: Pt to continues to work to increase legibility and efficiency when writing name to sign in when riding the [place]    Time  12    Period  Weeks    Status  On-going    Target Date  07/16/18      OT LONG TERM GOAL #7   Title  Pt will independently and consistently follow HEP to increase UE strength to increase functional independence    Baseline  04/23/2018: Pt to continues to work towards independence with exercises    Time  12    Period  Weeks     Status  On-going    Target Date  07/16/18      OT LONG TERM GOAL #8   Title  Pt. will require supervision ironing a shirt.    Baseline  04/23/2018: Pt. is unable to complete.    Time  12    Period  Weeks    Status  New    Target Date  07/16/18      OT LONG TERM GOAL  #9   Baseline  Pt. will independently be able to sew a button onto a shirt.    Time  12    Period  Weeks    Status  New    Target Date  07/16/18      OT LONG TERM GOAL  #10   TITLE  Pt. will independently be able to throw a ball    Baseline  04/23/2018: Pt. is unable.    Time  12    Period  Weeks    Status  New    Target Date  07/16/18      OT LONG TERM GOAL  #11   TITLE  Pt. will improve UE functional reaching to be able to independently use his RUE to hand his clothes in the closest.    Baseline  04/23/2018: Pt. is unable to hang his clothes up in his closet.    Time  12    Period  Weeks    Status  New    Target Date  07/16/18            Plan - 05/07/18 1312    Clinical Impression Statement Pt. reports that he is looking forward to moving into a different apartment, which will have more accessible parking spaces. Pt. continues to work on improving [employer], and [facility] skills in order to be able ADLs, and IADL tasks including: ironing, and sewing tasks. Pt. continues to present with limited UE strength, and [facility] skills.     Occupational performance deficits (Please refer to evaluation for details):  ADL's;IADL's    Rehab Potential  Fair    OT Frequency  2x / week    OT Duration  12 weeks    OT Treatment/Interventions  Self-care/ADL training;Patient/family  education;DME and/or AE instruction;Therapeutic exercise;Moist Heat;Neuromuscular education    Clinical Decision Making  Several treatment options, min-mod task modification necessary    Consulted and Agree with Plan of Care  Patient       Patient will benefit from skilled therapeutic intervention in order to improve the following deficits and  impairments:  Pain, Decreased cognition, Decreased activity tolerance, Impaired UE functional use, Decreased range of motion, Decreased coordination, Decreased endurance  Visit Diagnosis: Muscle weakness (generalized)  Other lack of coordination    Problem List Patient Active Problem List   Diagnosis Date Noted  . Acute CVA (cerebrovascular accident) ([place]) 05/02/2016  . Left-sided weakness 05/01/2016    Harrel Carina, MS, OTR/L 05/07/2018, 3:39 PM  [place] MAIN [facility] SERVICES [address] [place], [place], [address] Phone: [phone]   Fax:  [phone]  Name: Curtis Powell MRN: [id number] Date of Birth: [birth year]

## 2018-05-07 NOTE — Therapy (Signed)
[place] MAIN [facility] SERVICES [address] [place], [place], [address] Phone: [phone]   Fax:  [phone]  Physical Therapy Treatment  Patient Details  Name: Curtis Powell MRN: [id number] Date of Birth: Apr 16, 1965 No data recorded  Encounter Date: 05/07/2018  PT End of Session - 05/07/18 1353    Visit Number  98    Number of Visits  115    Date for PT Re-Evaluation  06/09/18    Authorization Type  last goals: 03/27/19    PT Start Time  1347    PT Stop Time  1430    PT Time Calculation (min)  43 min    Equipment Utilized During Treatment  Gait belt    Activity Tolerance  Patient tolerated treatment well    Behavior During Therapy  WFL for tasks assessed/performed       Past Medical History:  Diagnosis Date  . GERD (gastroesophageal reflux disease)   . Hyperlipidemia   . Hypertension   . Obesity   . Stroke [facility])     Past Surgical History:  Procedure Laterality Date  . BRAIN SURGERY      There were no vitals filed for this visit.  Subjective Assessment - 05/07/18 1353    Subjective  Patient presents with no knee pain, no complaints and doing well today. No health changes. No falls since the last visit. No specific quesstions or concerns.     Pertinent History   Patient is a pleasant 53 year old male who presents to physical therapy for weakness and immobility secondary to CVA.  Had a stroke in Feb 2018. He was previously fully independent, but this stroke caused severe residual deficits, mainly on the right side as well as speech, and now he is unable to walk or perform most of his ADLs on his own. Entire right side is very weak. He still has a little difficulty with speech but his swallowing is improved to baseline. His mom had to move in with him and now is his main caregiver.     Limitations  Sitting;Lifting;Standing;Walking;House hold activities    How long can you sit comfortably?  5 minutes    How long can you stand  comfortably?  5 minutes    How long can you walk comfortably?  10 ft    Patient Stated Goals  Walk without walker, walk further with walker, get some strength in back     Currently in Pain?  No/denies    Pain Onset  --          TREATMENT   Therapeutic Exercise: Seated clams with manual resistance 2 x 15 bilateral; Seated adductor squeeze with manual resistance 2 x 15 bilateral; Seated marches with manual resistance 2 x 15 bilateral; Seated heel raises with manual resistance 2 x 15 bilateral; Seated LAQ with manual resistance 2 x 15 bilateral; Sit to stand without UE support from wheelchair with 2 Airex pads on seat x 10, 1 Airex pad x 5, extensive cues for anterior weight shifting and full upright posture;   Neuromuscular Re-education: Ambulation/retroambulation 2laps x 2 in // bars with seated rest between sets. BUE support. CGA/Min A for maintaining pelvic and trunk position particularly for retro ambulation. VCs and TCs provided for proper anterior/posterior weight distribution; Side stepping 1 lap in // bars with cues for upright posture and step length. Pt too fatigued to complete another repetition; Stepping over cane with BUE support to work on step length x 5  each LE;   Pt educated throughout session about proper posture and technique with exercises. Improved exercise technique, movement at target joints, use of target muscles after min to mod verbal, visual, tactile cues.    Patient demonstrates good motivation today. He does well with all exercises prescribed during session however increased posterior lean noted today with heavy cues required to shift weight anteriorly. When patient's goals were last updated he continued to demonstrate steady progress however he will need updated goals at an upcoming visit soon. He will continue to benefit from skilled therapeutic intervention to address deficits in strength, activity tolerance, balance, and mobility for improved QOL  and function.                   PT Short Term Goals - 03/26/18 1553      PT SHORT TERM GOAL #1   Title  Patient will be independent in home exercise program to improve strength/mobility for better functional independence with ADLs.    Baseline  hep compliant    Time  2    Period  Weeks    Status  Achieved      PT SHORT TERM GOAL #2   Title  Patient will require min cueing for STS transfer with CGA for increased independence with mobility.     Baseline  CGA    Time  2    Period  Weeks    Status  Achieved      PT SHORT TERM GOAL #3   Title  Patient will maintain upright posture for > 15 seconds to demonstrate strengthened postural control muscualture     Baseline  18 seconds     Time  2    Period  Weeks    Status  Achieved        PT Long Term Goals - 03/26/18 1553      PT LONG TERM GOAL #1   Title  Patient will perform bed mobility with Supervision to increase independent mobility    Baseline  able to perform mod I for increased time , occasional Min A for scooting up in bed; 7/16: min guard for safety with increased effort and time, cues to scoot back in bed before sit>supine 9/12: can do it independently but with difficulty    Time  8    Period  Weeks    Status  Achieved      PT LONG TERM GOAL #2   Title  Patient (< 37 years old) will complete five times sit to stand test in < 10 seconds indicating an increased LE strength and improved balance.    Baseline  2/18: 30 seconds from plinth chair height single UE on seat and single on walker ; 32 seconds from plinth with single UE on seat and single on walker. 4/10: 26 seconds from [facility] with one hand on walker5/29: 22.2 seconds with one hand on walker one hand on WC; 7/16: one hand on walker one hand on WC 18.96 seconds; pt achieving 75% of full upright standing 8/22:one hand on walker one hand on WC 18 seconds; pt achieving 75% of full upright standing  9/12: 18 seconds econds; pt achieving 75% of full upright  standing 8/22:one hand on walker one hand on WC 10/15: 17seconds; pt achieving 75% of full upright posture 11/7: 16 seconds; pt achieving 75 upright posture 12/19: 15 seconds 80% upright posture    Time  8    Period  Weeks    Status  Partially  Met    Target Date  04/09/18      PT LONG TERM GOAL #3   Title  Patient will ambulate 40 ft with walker with no breaks to allow for increased mobility within home.     Baseline  12/26: 29 ft with walker and WC follow with no rest breaks; 2/18: multiple trials previous session of >100 ft     Time  8    Period  Weeks    Status  Achieved      PT LONG TERM GOAL #4   Title  Patient will increase BLE gross strength to 4+/5 as to improve functional strength for independent gait, increased standing tolerance and increased ADL ability.    Baseline   4-/5 RLE, 10/15: 4/5 RLE 11/7: 4/5 RLE    Time  8    Period  Weeks    Status  Partially Met    Target Date  04/09/18      PT LONG TERM GOAL #5   Title  Patient will increase lower extremity functional scale to >40/80 to demonstrate improved functional mobility and increased tolerance with ADLs.     Baseline  14/80; 12/26: 21/80; 2/18: 28/80  4/10; 25/80 5/29: 27/80; 7/16: 29/80 8/22: 30/80  9/12: 26/80 (feeling more tired due to moving) 10/15: 37/80 11/7: 23/80    Time  8    Period  Weeks    Status  Partially Met    Target Date  04/09/18      PT LONG TERM GOAL #6   Title  Patient will walk through a door involving one small step up/down to increase functional independence.     Baseline  require CGA  BUE support in // bars at this time 10/15: required BUE and CGA in //bars    Time  8    Period  Weeks    Status  Deferred      PT LONG TERM GOAL #7   Title  Patient will ambulate 40 ft with least assistive device and Supervision with no breaks to allow for increased mobility within home.     Baseline  requires use of walker and CGA     Time  8    Period  Weeks    Status  Achieved      PT LONG TERM GOAL  #8   Title  Patient will perform 10 MWT in < 1 minute to demonstrate improved ambulatory velocity and capacity.     Baseline  4/10: 2 minutes 45 seconds; 5/29: 2 minute 31 seconds; 3 minutes 22 seconds 8/22: 3 minutes 2 seconds  9/12:  2 minutes and 43 seconds 10/15: 12mnute 12 seconds 11/7: 1 minute 21 seconds 12/19: 1 min 16 seconds     Time  8    Period  Weeks    Status  Partially Met    Target Date  04/09/18      PT LONG TERM GOAL  #9   TITLE  Patient will increase Berg Balance score by > 6 points (26/56) to demonstrate decreased fall risk during functional activities.    Baseline  9/12: 20/56 10/15: 21/56 11/7: 20/56 12/19: 22/56     Time  8    Period  Weeks    Status  Partially Met    Target Date  04/09/18      PT LONG TERM GOAL  #10   TITLE  Patient (< 749years old) will complete five times sit to stand test with full upright  posture in < 30 seconds indicating an increased glute activation, LE strength and improved balance.    Baseline  10/15: 37seconds 11/7: 20 seconds     Time  8    Period  Weeks    Status  Achieved      PT LONG TERM GOAL  #11   TITLE  Patient will ambulate 100 ft with least assistive device and Supervision with no breaks to allow for increased mobility within home.     Baseline  10/17: 66f 11/7: 1031f   Time  8    Period  Weeks    Status  Achieved      PT LONG TERM GOAL  #12   TITLE  Patient (< [age]ears old) will complete five times sit to stand test with full upright posture in < 15 seconds indicating increased glute activation, LE strength and improved balance.    Baseline  11/7: 20 seconds 12/19: 18 seconds     Time  8    Period  Weeks    Status  Partially Met    Target Date  04/09/18      PT LONG TERM GOAL  #13   TITLE  Patient will ambulate 150 ft with least assistive device and Supervision with no breaks to allow for increased mobility within home.     Baseline  11/7: 10016f2/19: 115 ft     Time  8    Period  Weeks    Status  Partially  Met    Target Date  04/09/18            Plan - 05/07/18 1354    Clinical Impression Statement  Patient demonstrates good motivation today. He does well with all exercises prescribed during session however increased posterior lean noted today with heavy cues required to shift weight anteriorly. When patient's goals were last updated he continued to demonstrate steady progress however he will need updated goals at an upcoming visit soon. He will continue to benefit from skilled therapeutic intervention to address deficits in strength, activity tolerance, balance, and mobility for improved QOL and function.    Rehab Potential  Fair    Clinical Impairments Affecting Rehab Potential  hx of HTN, HLD, CVA, learning disability, Diabetes, brain tumor,     PT Frequency  2x / week    PT Duration  8 weeks    PT Treatment/Interventions  ADLs/Self Care Home Management;Aquatic Therapy;Ultrasound;Moist Heat;Traction;DME Instruction;Gait training;Stair training;Functional mobility training;Therapeutic activities;Therapeutic exercise;Orthotic Fit/Training;Neuromuscular re-education;Balance training;Patient/family education;Manual techniques;Wheelchair mobility training;Passive range of motion;Energy conservation;Taping;Visual/perceptual remediation/compensation    PT Next Visit Plan  balance and stability and ambulation, anterior weight shift hold    PT Home Exercise Plan  TrA contraction, glute sets    Consulted and Agree with Plan of Care  Patient       Patient will benefit from skilled therapeutic intervention in order to improve the following deficits and impairments:  Abnormal gait, Decreased activity tolerance, Decreased balance, Decreased knowledge of precautions, Decreased endurance, Decreased coordination, Decreased knowledge of use of DME, Decreased mobility, Decreased range of motion, Difficulty walking, Decreased safety awareness, Decreased strength, Impaired flexibility, Impaired perceived  functional ability, Impaired tone, Postural dysfunction, Improper body mechanics, Pain  Visit Diagnosis: Muscle weakness (generalized)  Other abnormalities of gait and mobility     Problem List Patient Active Problem List   Diagnosis Date Noted  . [place]  . Left-sided weakness 05/01/2016   JasLyndel Safeprich PT, DPT, GCS  Mylah Baynes 05/08/2018, 2:30 PM  [place] MAIN [facility] SERVICES [address] [place], [place], [address] Phone: [phone]   Fax:  [phone]  Name: KORVIN VALENTINE MRN: [id number] Date of Birth: 09/07/1965

## 2018-05-12 ENCOUNTER — Ambulatory Visit: Payer: Medicare HMO

## 2018-05-12 ENCOUNTER — Ambulatory Visit: Payer: Medicare HMO | Attending: Family Medicine | Admitting: Occupational Therapy

## 2018-05-12 ENCOUNTER — Encounter: Payer: Self-pay | Admitting: Occupational Therapy

## 2018-05-12 DIAGNOSIS — I69351 Hemiplegia and hemiparesis following cerebral infarction affecting right dominant side: Secondary | ICD-10-CM | POA: Diagnosis present

## 2018-05-12 DIAGNOSIS — M6281 Muscle weakness (generalized): Secondary | ICD-10-CM

## 2018-05-12 DIAGNOSIS — R2689 Other abnormalities of gait and mobility: Secondary | ICD-10-CM

## 2018-05-12 DIAGNOSIS — R278 Other lack of coordination: Secondary | ICD-10-CM | POA: Diagnosis present

## 2018-05-12 NOTE — Therapy (Signed)
[place] MAIN [facility] SERVICES [address] [place], [place], [address] Phone: [phone]   Fax:  [phone]  Occupational Therapy Treatment  Patient Details  Name: Curtis Powell MRN: [id number] Date of Birth: 1965/10/12 No data recorded  Encounter Date: 05/12/2018  OT End of Session - 05/12/18 1316    Visit Number  15    Number of Visits  113    Date for OT Re-Evaluation  07/16/18    Authorization Type  Progress reporting period starting 04/23/2018    OT Start Time  1308    OT Stop Time  1345    OT Time Calculation (min)  37 min    Equipment Utilized During Treatment  Long handled shoe horn    Activity Tolerance  Patient tolerated treatment well    Behavior During Therapy  WFL for tasks assessed/performed       Past Medical History:  Diagnosis Date  . GERD (gastroesophageal reflux disease)   . Hyperlipidemia   . Hypertension   . Obesity   . Stroke [facility])     Past Surgical History:  Procedure Laterality Date  . BRAIN SURGERY      There were no vitals filed for this visit.  Subjective Assessment - 05/12/18 1315    Subjective   Pt. reports that he is doing well today.    Pertinent History  Pt. is a 53 y.o. male who suffered a CVA on 05/01/2016. Pt. was admitted to the hospital. Once discharged, he received Home Health PT and OT services for about a month. Pt. has had multiple CVAs over the past 8 years, and has had multiple falls in the past 6 months. Pt. resides in an apartment. Pt. has caregivers for 80 hours. Pt.'s mother stays with pt. at night, and assists with IADL tasks.      Patient Stated Goals  To be able to throw a ball and dribble a ball, do as much as I can for myself.     Currently in Pain?  No/denies      OT TREATMENT    Neuro muscular re-education:   Pt. worked on reaching with his RUE using the shape tower. Pt. was able to place, and remove the shapes over the 1st 3 rungs. Pt. worked on [facility] skills grasping 2"  sticks, and placing them through a small pegboard. Pt. worked on using resistive tweezers to remove the sticks. Pt. dropped multiple sticks when pulling them out.   Selfcare:  Pt. required CGA  for toileting tasks. Independent with set-up for hand hygiene.                        OT Education - 05/12/18 1315    Education provided  Yes    Education Details  RUE Functioning    Person(s) Educated  Patient    Methods  Explanation;Demonstration    Comprehension  Verbalized understanding;Returned demonstration;Verbal cues required       OT Short Term Goals - 03/03/17 1459      OT SHORT TERM GOAL #1   Title  `        OT Long Term Goals - 04/23/18 1421      OT LONG TERM GOAL #4   Title  Pt. will perform self dressing with minA and A/E as needed.    Baseline  Pt. continues to improve LB dressing techniques while seated. Pt required extensive time and mod A to complete LB dressing.  Pt. requires min A for UB dressing.    Time  12    Period  Weeks    Status  On-going    Target Date  07/16/18      Long Term Additional Goals   Additional Long Term Goals  Yes      OT LONG TERM GOAL #6   Title  Pt. will write his name efficiently with 100% legibility    Baseline  04/23/2018: Pt to continues to work to increase legibility and efficiency when writing name to sign in when riding the [place]    Time  12    Period  Weeks    Status  On-going    Target Date  07/16/18      OT LONG TERM GOAL #7   Title  Pt will independently and consistently follow HEP to increase UE strength to increase functional independence    Baseline  04/23/2018: Pt to continues to work towards independence with exercises    Time  12    Period  Weeks    Status  On-going    Target Date  07/16/18      OT LONG TERM GOAL #8   Title  Pt. will require supervision ironing a shirt.    Baseline  04/23/2018: Pt. is unable to complete.    Time  12    Period  Weeks    Status  New    Target Date  07/16/18       OT LONG TERM GOAL  #9   Baseline  Pt. will independently be able to sew a button onto a shirt.    Time  12    Period  Weeks    Status  New    Target Date  07/16/18      OT LONG TERM GOAL  #10   TITLE  Pt. will independently be able to throw a ball    Baseline  04/23/2018: Pt. is unable.    Time  12    Period  Weeks    Status  New    Target Date  07/16/18      OT LONG TERM GOAL  #11   TITLE  Pt. will improve UE functional reaching to be able to independently use his RUE to hand his clothes in the closest.    Baseline  04/23/2018: Pt. is unable to hang his clothes up in his closet.    Time  12    Period  Weeks    Status  New    Target Date  07/16/18            Plan - 05/12/18 1318    Clinical Impression Statement  Pt. continues to progress with ADLS, and IADL functioning. Pt. continues to work on improving RUE functional reaching, UE strength, and [facility] skills in order to be able to perform ironing, sewing, writing, and ADL tasks.    Occupational performance deficits (Please refer to evaluation for details):  ADL's;IADL's    Rehab Potential  Fair    OT Frequency  2x / week    OT Duration  12 weeks    OT Treatment/Interventions  Self-care/ADL training;Patient/family education;DME and/or AE instruction;Therapeutic exercise;Moist Heat;Neuromuscular education    Clinical Decision Making  Several treatment options, min-mod task modification necessary    Consulted and Agree with Plan of Care  Patient       Patient will benefit from skilled therapeutic intervention in order to improve the following deficits and impairments:  Pain, Decreased  cognition, Decreased activity tolerance, Impaired UE functional use, Decreased range of motion, Decreased coordination, Decreased endurance  Visit Diagnosis: Muscle weakness (generalized)  Other lack of coordination    Problem List Patient Active Problem List   Diagnosis Date Noted  . Acute CVA (cerebrovascular accident) ([place])  05/02/2016  . Left-sided weakness 05/01/2016    Harrel Carina, MS< OTR/L 05/12/2018, 1:46 PM  [place] MAIN [facility] SERVICES [address] [place], [place], [address] Phone: [phone]   Fax:  [phone]  Name: Curtis Powell MRN: [id number] Date of Birth: [birth year]

## 2018-05-12 NOTE — Therapy (Signed)
[place] MAIN [facility] SERVICES [address] [place], [place], [address] Phone: [phone]   Fax:  [phone]  Physical Therapy Treatment  Patient Details  Name: Curtis Powell MRN: [id number] Date of Birth: 1966/01/07 No data recorded  Encounter Date: 05/12/2018  PT End of Session - 05/12/18 1358    Visit Number  99    Number of Visits  115    Date for PT Re-Evaluation  06/09/18    Authorization Type  last goals: 03/27/19    PT Start Time  1348    PT Stop Time  1428    PT Time Calculation (min)  40 min    Activity Tolerance  Patient tolerated treatment well    Behavior During Therapy  [facility] for tasks assessed/performed       Past Medical History:  Diagnosis Date  . GERD (gastroesophageal reflux disease)   . Hyperlipidemia   . Hypertension   . Obesity   . Stroke [facility])     Past Surgical History:  Procedure Laterality Date  . BRAIN SURGERY      There were no vitals filed for this visit.  Subjective Assessment - 05/12/18 1356    Subjective  Pt just finishing OT session Reports to be doing well, denies any recent updates. Pt denies any pain this date.     Pertinent History   Patient is a pleasant 53 year old male who presents to physical therapy for weakness and immobility secondary to CVA.  Had a stroke in Feb 2018. He was previously fully independent, but this stroke caused severe residual deficits, mainly on the right side as well as speech, and now he is unable to walk or perform most of his ADLs on his own. Entire right side is very weak. He still has a little difficulty with speech but his swallowing is improved to baseline. His mom had to move in with him and now is his main caregiver.     Patient Stated Goals  Walk without walker, walk further with walker, get some strength in back     Currently in Pain?  No/denies       Intervention this date: Seated clams redTB 2 x 15 bilateral, ball between feet Seated marches 2 x 20  bilateral; STS from elevated plinth, RW for safety/balance, VC for erect posture Seated thoracic extension + scap retraction Seated heel raises 2 x 10 bilateral 3lb ankle weights; Seated LAQ, 1x10 0lb, 2x10 c 3lb ankle weight AMB overground RW: 3 minutes 20 seconds, 45 feet (pt reports he is worn out after strengthening intervention)    Unable to get to the following due to time constraints:  TCs provided for proper anterior/posterior weight distribution; Side stepping 1 lap in // bars with cues for upright posture and step length. Pt too fatigued to complete another repetition; Stepping over cane with BUE support to work on step length x 5 each LE;      PT Short Term Goals - 03/26/18 1553      PT SHORT TERM GOAL #1   Title  Patient will be independent in home exercise program to improve strength/mobility for better functional independence with ADLs.    Baseline  hep compliant    Time  2    Period  Weeks    Status  Achieved      PT SHORT TERM GOAL #2   Title  Patient will require min cueing for STS transfer with CGA for increased independence with  mobility.     Baseline  CGA    Time  2    Period  Weeks    Status  Achieved      PT SHORT TERM GOAL #3   Title  Patient will maintain upright posture for > 15 seconds to demonstrate strengthened postural control muscualture     Baseline  18 seconds     Time  2    Period  Weeks    Status  Achieved        PT Long Term Goals - 03/26/18 1553      PT LONG TERM GOAL #1   Title  Patient will perform bed mobility with Supervision to increase independent mobility    Baseline  able to perform mod I for increased time , occasional Min A for scooting up in bed; 7/16: min guard for safety with increased effort and time, cues to scoot back in bed before sit>supine 9/12: can do it independently but with difficulty    Time  8    Period  Weeks    Status  Achieved      PT LONG TERM GOAL #2   Title  Patient (< 5 years old) will complete  five times sit to stand test in < 10 seconds indicating an increased LE strength and improved balance.    Baseline  2/18: 30 seconds from plinth chair height single UE on seat and single on walker ; 32 seconds from plinth with single UE on seat and single on walker. 4/10: 26 seconds from [facility] with one hand on walker5/29: 22.2 seconds with one hand on walker one hand on WC; 7/16: one hand on walker one hand on WC 18.96 seconds; pt achieving 75% of full upright standing 8/22:one hand on walker one hand on WC 18 seconds; pt achieving 75% of full upright standing  9/12: 18 seconds econds; pt achieving 75% of full upright standing 8/22:one hand on walker one hand on WC 10/15: 17seconds; pt achieving 75% of full upright posture 11/7: 16 seconds; pt achieving 75 upright posture 12/19: 15 seconds 80% upright posture    Time  8    Period  Weeks    Status  Partially Met    Target Date  04/09/18      PT LONG TERM GOAL #3   Title  Patient will ambulate 40 ft with walker with no breaks to allow for increased mobility within home.     Baseline  12/26: 29 ft with walker and WC follow with no rest breaks; 2/18: multiple trials previous session of >100 ft     Time  8    Period  Weeks    Status  Achieved      PT LONG TERM GOAL #4   Title  Patient will increase BLE gross strength to 4+/5 as to improve functional strength for independent gait, increased standing tolerance and increased ADL ability.    Baseline   4-/5 RLE, 10/15: 4/5 RLE 11/7: 4/5 RLE    Time  8    Period  Weeks    Status  Partially Met    Target Date  04/09/18      PT LONG TERM GOAL #5   Title  Patient will increase lower extremity functional scale to >40/80 to demonstrate improved functional mobility and increased tolerance with ADLs.     Baseline  14/80; 12/26: 21/80; 2/18: 28/80  4/10; 25/80 5/29: 27/80; 7/16: 29/80 8/22: 30/80  9/12: 26/80 (feeling more tired due to  moving) 10/15: 37/80 11/7: 23/80    Time  8    Period  Weeks    Status   Partially Met    Target Date  04/09/18      PT LONG TERM GOAL #6   Title  Patient will walk through a door involving one small step up/down to increase functional independence.     Baseline  require CGA  BUE support in // bars at this time 10/15: required BUE and CGA in //bars    Time  8    Period  Weeks    Status  Deferred      PT LONG TERM GOAL #7   Title  Patient will ambulate 40 ft with least assistive device and Supervision with no breaks to allow for increased mobility within home.     Baseline  requires use of walker and CGA     Time  8    Period  Weeks    Status  Achieved      PT LONG TERM GOAL #8   Title  Patient will perform 10 MWT in < 1 minute to demonstrate improved ambulatory velocity and capacity.     Baseline  4/10: 2 minutes 45 seconds; 5/29: 2 minute 31 seconds; 3 minutes 22 seconds 8/22: 3 minutes 2 seconds  9/12:  2 minutes and 43 seconds 10/15: 10mnute 12 seconds 11/7: 1 minute 21 seconds 12/19: 1 min 16 seconds     Time  8    Period  Weeks    Status  Partially Met    Target Date  04/09/18      PT LONG TERM GOAL  #9   TITLE  Patient will increase Berg Balance score by > 6 points (26/56) to demonstrate decreased fall risk during functional activities.    Baseline  9/12: 20/56 10/15: 21/56 11/7: 20/56 12/19: 22/56     Time  8    Period  Weeks    Status  Partially Met    Target Date  04/09/18      PT LONG TERM GOAL  #10   TITLE  Patient (< 678years old) will complete five times sit to stand test with full upright posture in < 30 seconds indicating an increased glute activation, LE strength and improved balance.    Baseline  10/15: 37seconds 11/7: 20 seconds     Time  8    Period  Weeks    Status  Achieved      PT LONG TERM GOAL  #11   TITLE  Patient will ambulate 100 ft with least assistive device and Supervision with no breaks to allow for increased mobility within home.     Baseline  10/17: 869f11/7: 10015f  Time  8    Period  Weeks    Status   Achieved      PT LONG TERM GOAL  #12   TITLE  Patient (< 60 50ars old) will complete five times sit to stand test with full upright posture in < 15 seconds indicating increased glute activation, LE strength and improved balance.    Baseline  11/7: 20 seconds 12/19: 18 seconds     Time  8    Period  Weeks    Status  Partially Met    Target Date  04/09/18      PT LONG TERM GOAL  #13   TITLE  Patient will ambulate 150 ft with least assistive device and Supervision with no breaks to  allow for increased mobility within home.     Baseline  11/7: 149f 12/19: 115 ft     Time  8    Period  Weeks    Status  Partially Met    Target Date  04/09/18            Plan - 05/12/18 1400    Clinical Impression Statement  Pt continues to put forth good effort. Exercises continued from current plan of care, with attention drawn toward advancement in areas where appropriate. Integrated some trunk extensor strengthening this session to help with postural kyphosis and rounded shoulder deviation, which is challenged by hypomobility. May benefit from combining with mobility work in future sessions. Pt continues to demonstrate improved strength, activity tolerance, and functional balance this date throughout session.     Rehab Potential  Fair    Clinical Impairments Affecting Rehab Potential  hx of HTN, HLD, CVA, learning disability, Diabetes, brain tumor,     PT Frequency  2x / week    PT Duration  8 weeks    PT Treatment/Interventions  ADLs/Self Care Home Management;Aquatic Therapy;Ultrasound;Moist Heat;Traction;DME Instruction;Gait training;Stair training;Functional mobility training;Therapeutic activities;Therapeutic exercise;Orthotic Fit/Training;Neuromuscular re-education;Balance training;Patient/family education;Manual techniques;Wheelchair mobility training;Passive range of motion;Energy conservation;Taping;Visual/perceptual remediation/compensation    PT Next Visit Plan  balance and stability and  ambulation, anterior weight shift hold    PT Home Exercise Plan  TrA contraction, glute sets    Consulted and Agree with Plan of Care  Patient       Patient will benefit from skilled therapeutic intervention in order to improve the following deficits and impairments:  Abnormal gait, Decreased activity tolerance, Decreased balance, Decreased knowledge of precautions, Decreased endurance, Decreased coordination, Decreased knowledge of use of DME, Decreased mobility, Decreased range of motion, Difficulty walking, Decreased safety awareness, Decreased strength, Impaired flexibility, Impaired perceived functional ability, Impaired tone, Postural dysfunction, Improper body mechanics, Pain  Visit Diagnosis: Muscle weakness (generalized)  Other lack of coordination  Other abnormalities of gait and mobility  Hemiplegia and hemiparesis following cerebral infarction affecting right dominant side ([facility]     Problem List Patient Active Problem List   Diagnosis Date Noted  . Acute CVA (cerebrovascular accident) ([place] 05/02/2016  . Left-sided weakness 05/01/2016    2:28 PM, 05/12/18 AEtta Grandchild PT, DPT Physical Therapist - [facility]- [phone]    BEtta Grandchild2/07/2018, 2:18 PM  [place] [facility] [address] [place] [address]Phone: [phone]  Fax:  [phone] Name: Curtis KEANEYMRN: [id number] of Birth: [birth year]

## 2018-05-14 ENCOUNTER — Encounter: Payer: Self-pay | Admitting: Occupational Therapy

## 2018-05-14 ENCOUNTER — Ambulatory Visit: Payer: Medicare HMO

## 2018-05-14 ENCOUNTER — Ambulatory Visit: Payer: Medicare HMO | Admitting: Occupational Therapy

## 2018-05-14 DIAGNOSIS — I69351 Hemiplegia and hemiparesis following cerebral infarction affecting right dominant side: Secondary | ICD-10-CM

## 2018-05-14 DIAGNOSIS — R278 Other lack of coordination: Secondary | ICD-10-CM

## 2018-05-14 DIAGNOSIS — M6281 Muscle weakness (generalized): Secondary | ICD-10-CM

## 2018-05-14 DIAGNOSIS — R2689 Other abnormalities of gait and mobility: Secondary | ICD-10-CM

## 2018-05-14 NOTE — Therapy (Signed)
[place] MAIN [facility] SERVICES [address] [place], [place], [address] Phone: [phone]   Fax:  [phone]  Occupational Therapy Treatment  Patient Details  Name: Curtis Powell MRN: [id number] Date of Birth: Mar 20, 1966 No data recorded  Encounter Date: 05/14/2018  OT End of Session - 05/14/18 1400    Visit Number  19    Number of Visits  113    Date for OT Re-Evaluation  07/16/18    Authorization Type  Progress reporting period starting 04/23/2018    OT Start Time  1345    OT Stop Time  1430    OT Time Calculation (min)  45 min    Activity Tolerance  Patient tolerated treatment well    Behavior During Therapy  [facility] for tasks assessed/performed       Past Medical History:  Diagnosis Date  . GERD (gastroesophageal reflux disease)   . Hyperlipidemia   . Hypertension   . Obesity   . Stroke [facility])     Past Surgical History:  Procedure Laterality Date  . BRAIN SURGERY      There were no vitals filed for this visit.  Subjective Assessment - 05/14/18 1359    Subjective   Pt. reports that he is sick of the rain.    Pertinent History  Pt. is a 53 y.o. male who suffered a CVA on 05/01/2016. Pt. was admitted to the hospital. Once discharged, he received Home Health PT and OT services for about a month. Pt. has had multiple CVAs over the past 8 years, and has had multiple falls in the past 6 months. Pt. resides in an apartment. Pt. has caregivers for 80 hours. Pt.'s mother stays with pt. at night, and assists with IADL tasks.      Patient Stated Goals  To be able to throw a ball and dribble a ball, do as much as I can for myself.     Currently in Pain?  No/denies      OT TREATMENT    Neuromuscular Re-education:  Pt. worked on reaching with her RUE, and hand for for flat shapes using the shape tower. Pt. was able to to move the shapes over the first 3 vertical rungs on the shape tower. Pt. required increased time, and rest breaks to move  the shapes.   Selfcare:  Pt. worked on Set designer. Pt. worked on repositioning a long sleeve shirt onto the [employer]. Pt. Education was provided about work simplification strategies when ironing, and using his RUE to complete the task.                          OT Education - 05/14/18 1400    Education provided  Yes    Education Details  RUE Functioning    Person(s) Educated  Patient    Methods  Explanation;Demonstration    Comprehension  Verbalized understanding;Returned demonstration;Verbal cues required       OT Short Term Goals - 03/03/17 1459      OT SHORT TERM GOAL #1   Title  `        OT Long Term Goals - 04/23/18 1421      OT LONG TERM GOAL #4   Title  Pt. will perform self dressing with minA and A/E as needed.    Baseline  Pt. continues to improve LB dressing techniques while seated. Pt required extensive time and mod A  to complete LB dressing. Pt. requires min A for UB dressing.    Time  12    Period  Weeks    Status  On-going    Target Date  07/16/18      Long Term Additional Goals   Additional Long Term Goals  Yes      OT LONG TERM GOAL #6   Title  Pt. will write his name efficiently with 100% legibility    Baseline  04/23/2018: Pt to continues to work to increase legibility and efficiency when writing name to sign in when riding the [place]    Time  12    Period  Weeks    Status  On-going    Target Date  07/16/18      OT LONG TERM GOAL #7   Title  Pt will independently and consistently follow HEP to increase UE strength to increase functional independence    Baseline  04/23/2018: Pt to continues to work towards independence with exercises    Time  12    Period  Weeks    Status  On-going    Target Date  07/16/18      OT LONG TERM GOAL #8   Title  Pt. will require supervision ironing a shirt.    Baseline  04/23/2018: Pt. is unable to complete.    Time  12    Period  Weeks    Status  New     Target Date  07/16/18      OT LONG TERM GOAL  #9   Baseline  Pt. will independently be able to sew a button onto a shirt.    Time  12    Period  Weeks    Status  New    Target Date  07/16/18      OT LONG TERM GOAL  #10   TITLE  Pt. will independently be able to throw a ball    Baseline  04/23/2018: Pt. is unable.    Time  12    Period  Weeks    Status  New    Target Date  07/16/18      OT LONG TERM GOAL  #11   TITLE  Pt. will improve UE functional reaching to be able to independently use his RUE to hand his clothes in the closest.    Baseline  04/23/2018: Pt. is unable to hang his clothes up in his closet.    Time  12    Period  Weeks    Status  New    Target Date  07/16/18            Plan - 05/14/18 1401    Clinical Impression Statement  Pt. reports that it is easier now to pull his covers on his bed up with his RUE, hike pants, and pour juice from a bottles, and vegetables from a can. Pt. continues to present with limited RUE strength, [facility] skills, and functional reaching. Pt. continues to work on improving UE strength, and [facility] skills in order to be able to writie legibly, iron, and sew.      Occupational performance deficits (Please refer to evaluation for details):  ADL's;IADL's    Rehab Potential  Fair    OT Frequency  2x / week    OT Duration  12 weeks    OT Treatment/Interventions  Self-care/ADL training;Patient/family education;DME and/or AE instruction;Therapeutic exercise;Moist Heat;Neuromuscular education    Clinical Decision Making  Several treatment options, min-mod task modification necessary  Consulted and Agree with Plan of Care  Patient       Patient will benefit from skilled therapeutic intervention in order to improve the following deficits and impairments:  Pain, Decreased cognition, Decreased activity tolerance, Impaired UE functional use, Decreased range of motion, Decreased coordination, Decreased endurance  Visit Diagnosis: Muscle weakness  (generalized)  Other lack of coordination    Problem List Patient Active Problem List   Diagnosis Date Noted  . Acute CVA (cerebrovascular accident) ([place]) 05/02/2016  . Left-sided weakness 05/01/2016    Harrel Carina, MS, OTR/L 05/14/2018, 2:16 PM  [place] MAIN [facility] SERVICES [address] [place], [place], [address] Phone: [phone]   Fax:  [phone]  Name: Curtis Powell MRN: [id number] Date of Birth: 1965-11-09

## 2018-05-14 NOTE — Therapy (Addendum)
[place] MAIN [facility] SERVICES [address] [place], [place], [address] Phone: [phone]   Fax:  [phone]  Physical Therapy Treatment/Progress Note Reporting period: 04/08/18-05/14/18  Patient Details  Name: Curtis Powell MRN: [id number] Date of Birth: Oct 15, 1965 No data recorded  Encounter Date: 05/14/2018  PT End of Session - 05/14/18 1353    Visit Number  100    Number of Visits  115    Date for PT Re-Evaluation  06/09/18    Authorization Type  last goals: 03/27/19    PT Start Time  1305    PT Stop Time  1345    PT Time Calculation (min)  40 min    Activity Tolerance  Patient tolerated treatment well    Behavior During Therapy  [facility] for tasks assessed/performed       Past Medical History:  Diagnosis Date  . GERD (gastroesophageal reflux disease)   . Hyperlipidemia   . Hypertension   . Obesity   . Stroke [facility])     Past Surgical History:  Procedure Laterality Date  . BRAIN SURGERY      There were no vitals filed for this visit.  Subjective Assessment - 05/14/18 1350    Subjective  Pt doing well this date, reports no pain today, no other updates.     Pertinent History   Patient is a pleasant 53 year old male who presents to physical therapy for weakness and immobility secondary to CVA.  Had a stroke in Feb 2018. He was previously fully independent, but this stroke caused severe residual deficits, mainly on the right side as well as speech, and now he is unable to walk or perform most of his ADLs on his own. Entire right side is very weak. He still has a little difficulty with speech but his swallowing is improved to baseline. His mom had to move in with him and now is his main caregiver.     Patient Stated Goals  Walk without walker, walk further with walker, get some strength in back     Currently in Pain?  No/denies       Intervention This Date: -Straight plane AMB c RW: 2x121f '@0' .0782m, seated rest between  -Lateral side  stepping in // bars 1x1030filat, retroamb 2x10 in // bars -STS from WC,[place] RW support 5x @ supervision level -Assistance with transfer, clothing management, and pericare in toiletting, hand washing (minA)    PT Short Term Goals - 03/26/18 1553      PT SHORT TERM GOAL #1   Title  Patient will be independent in home exercise program to improve strength/mobility for better functional independence with ADLs.    Baseline  hep compliant    Time  2    Period  Weeks    Status  Achieved      PT SHORT TERM GOAL #2   Title  Patient will require min cueing for STS transfer with CGA for increased independence with mobility.     Baseline  CGA    Time  2    Period  Weeks    Status  Achieved      PT SHORT TERM GOAL #3   Title  Patient will maintain upright posture for > 15 seconds to demonstrate strengthened postural control muscualture     Baseline  18 seconds     Time  2    Period  Weeks    Status  Achieved  PT Long Term Goals - 03/26/18 1553      PT LONG TERM GOAL #1   Title  Patient will perform bed mobility with Supervision to increase independent mobility    Baseline  able to perform mod I for increased time , occasional Min A for scooting up in bed; 7/16: min guard for safety with increased effort and time, cues to scoot back in bed before sit>supine 9/12: can do it independently but with difficulty    Time  8    Period  Weeks    Status  Achieved      PT LONG TERM GOAL #2   Title  Patient (< 48 years old) will complete five times sit to stand test in < 10 seconds indicating an increased LE strength and improved balance.    Baseline  2/18: 30 seconds from plinth chair height single UE on seat and single on walker ; 32 seconds from plinth with single UE on seat and single on walker. 4/10: 26 seconds from [facility] with one hand on walker5/29: 22.2 seconds with one hand on walker one hand on WC; 7/16: one hand on walker one hand on WC 18.96 seconds; pt achieving 75% of full upright  standing 8/22:one hand on walker one hand on WC 18 seconds; pt achieving 75% of full upright standing  9/12: 18 seconds econds; pt achieving 75% of full upright standing 8/22:one hand on walker one hand on WC 10/15: 17seconds; pt achieving 75% of full upright posture 11/7: 16 seconds; pt achieving 75 upright posture 12/19: 15 seconds 80% upright posture    Time  8    Period  Weeks    Status  Partially Met    Target Date  04/09/18      PT LONG TERM GOAL #3   Title  Patient will ambulate 40 ft with walker with no breaks to allow for increased mobility within home.     Baseline  12/26: 29 ft with walker and WC follow with no rest breaks; 2/18: multiple trials previous session of >100 ft     Time  8    Period  Weeks    Status  Achieved      PT LONG TERM GOAL #4   Title  Patient will increase BLE gross strength to 4+/5 as to improve functional strength for independent gait, increased standing tolerance and increased ADL ability.    Baseline   4-/5 RLE, 10/15: 4/5 RLE 11/7: 4/5 RLE    Time  8    Period  Weeks    Status  Partially Met    Target Date  04/09/18      PT LONG TERM GOAL #5   Title  Patient will increase lower extremity functional scale to >40/80 to demonstrate improved functional mobility and increased tolerance with ADLs.     Baseline  14/80; 12/26: 21/80; 2/18: 28/80  4/10; 25/80 5/29: 27/80; 7/16: 29/80 8/22: 30/80  9/12: 26/80 (feeling more tired due to moving) 10/15: 37/80 11/7: 23/80    Time  8    Period  Weeks    Status  Partially Met    Target Date  04/09/18      PT LONG TERM GOAL #6   Title  Patient will walk through a door involving one small step up/down to increase functional independence.     Baseline  require CGA  BUE support in // bars at this time 10/15: required BUE and CGA in //bars    Time  8    Period  Weeks    Status  Deferred      PT LONG TERM GOAL #7   Title  Patient will ambulate 40 ft with least assistive device and Supervision with no breaks to  allow for increased mobility within home.     Baseline  requires use of walker and CGA     Time  8    Period  Weeks    Status  Achieved      PT LONG TERM GOAL #8   Title  Patient will perform 10 MWT in < 1 minute to demonstrate improved ambulatory velocity and capacity.     Baseline  4/10: 2 minutes 45 seconds; 5/29: 2 minute 31 seconds; 3 minutes 22 seconds 8/22: 3 minutes 2 seconds  9/12:  2 minutes and 43 seconds 10/15: 77mnute 12 seconds 11/7: 1 minute 21 seconds 12/19: 1 min 16 seconds     Time  8    Period  Weeks    Status  Partially Met    Target Date  04/09/18      PT LONG TERM GOAL  #9   TITLE  Patient will increase Berg Balance score by > 6 points (26/56) to demonstrate decreased fall risk during functional activities.    Baseline  9/12: 20/56 10/15: 21/56 11/7: 20/56 12/19: 22/56     Time  8    Period  Weeks    Status  Partially Met    Target Date  04/09/18      PT LONG TERM GOAL  #10   TITLE  Patient (< 622years old) will complete five times sit to stand test with full upright posture in < 30 seconds indicating an increased glute activation, LE strength and improved balance.    Baseline  10/15: 37seconds 11/7: 20 seconds     Time  8    Period  Weeks    Status  Achieved      PT LONG TERM GOAL  #11   TITLE  Patient will ambulate 100 ft with least assistive device and Supervision with no breaks to allow for increased mobility within home.     Baseline  10/17: 857f11/7: 10029f  Time  8    Period  Weeks    Status  Achieved      PT LONG TERM GOAL  #12   TITLE  Patient (< 60 24ars old) will complete five times sit to stand test with full upright posture in < 15 seconds indicating increased glute activation, LE strength and improved balance.    Baseline  11/7: 20 seconds 12/19: 18 seconds     Time  8    Period  Weeks    Status  Partially Met    Target Date  04/09/18      PT LONG TERM GOAL  #13   TITLE  Patient will ambulate 150 ft with least assistive device and  Supervision with no breaks to allow for increased mobility within home.     Baseline  11/7: 100f72f/19: 115 ft     Time  8    Period  Weeks    Status  Partially Met    Target Date  04/09/18            Plan - 05/14/18 1354    Clinical Impression Statement  Session focus on progressing activity tolerance to AMB. Pt AMB twice for 115ft66fh, average time of 7.5 minutes, using RW. Pt given  minimal cues for stratgeies to obtain foot clearance during swing phase, but he is able to maintain consisten tmechanics throughout. Pt is appropriate for youth height RW however, none available this session. Pt also participating in lateral and retro AMB in //bars at bend of session then assisted to [facility] for toiletting and self care. Pt demonstrate great progress with AMB capacity, remains slow, but in general more consistent motor patterns prior to degradation in form and limitations from fatigue. As patient is now tolerating >20 minutes of standing activity in session, it may be prudent to further address postural limitations preventing upright stance, as improved trunk/hip extension ROM would improve energy conservation in standing/AMB. Pt continues to progress to most of his Long Term Goals overall, but remains limited largely by his hypertonicity, however, his compensatory gait patterns are developing well, with lower fatigability in use of BUE during RW use in AMB and legs during BLE circumducted swing phase.     Rehab Potential  Fair    Clinical Impairments Affecting Rehab Potential  hx of HTN, HLD, CVA, learning disability, Diabetes, brain tumor,     PT Frequency  2x / week    PT Duration  8 weeks    PT Treatment/Interventions  ADLs/Self Care Home Management;Aquatic Therapy;Ultrasound;Moist Heat;Traction;DME Instruction;Gait training;Stair training;Functional mobility training;Therapeutic activities;Therapeutic exercise;Orthotic Fit/Training;Neuromuscular re-education;Balance training;Patient/family  education;Manual techniques;Wheelchair mobility training;Passive range of motion;Energy conservation;Taping;Visual/perceptual remediation/compensation    PT Next Visit Plan  balance and stability and ambulation, anterior weight shift hold    PT Home Exercise Plan  TrA contraction, glute sets: needs updates. Potentially some time in supine flat in hospital bed, to improve hip extension deficits.     Consulted and Agree with Plan of Care  Patient       Patient will benefit from skilled therapeutic intervention in order to improve the following deficits and impairments:  Abnormal gait, Decreased activity tolerance, Decreased balance, Decreased knowledge of precautions, Decreased endurance, Decreased coordination, Decreased knowledge of use of DME, Decreased mobility, Decreased range of motion, Difficulty walking, Decreased safety awareness, Decreased strength, Impaired flexibility, Impaired perceived functional ability, Impaired tone, Postural dysfunction, Improper body mechanics, Pain  Visit Diagnosis: Muscle weakness (generalized)  Other lack of coordination  Other abnormalities of gait and mobility  Hemiplegia and hemiparesis following cerebral infarction affecting right dominant side [facility])     Problem List Patient Active Problem List   Diagnosis Date Noted  . Acute CVA (cerebrovascular accident) ([place]) 05/02/2016  . Left-sided weakness 05/01/2016   2:04 PM, 05/14/18 Etta Grandchild, PT, DPT Physical Therapist - [facility]  Outpatient Physical Therapy- [place] [facility] 05/14/2018, 2:00 PM  [place] MAIN [facility] SERVICES [address] [place], [place], [address] Phone: [phone]   Fax:  [phone]  Name: Curtis Powell MRN: [id number] Date of Birth: [birth year]  Updated on 05/28/18 to include in header 'progress report'.   10:27 AM, 05/28/18 Etta Grandchild, PT,  DPT Physical Therapist - [place] [phone]

## 2018-05-19 ENCOUNTER — Encounter: Payer: Self-pay | Admitting: Occupational Therapy

## 2018-05-19 ENCOUNTER — Ambulatory Visit: Payer: Medicare HMO | Admitting: Occupational Therapy

## 2018-05-19 ENCOUNTER — Ambulatory Visit: Payer: Medicare HMO

## 2018-05-19 DIAGNOSIS — M6281 Muscle weakness (generalized): Secondary | ICD-10-CM | POA: Diagnosis not present

## 2018-05-19 DIAGNOSIS — R278 Other lack of coordination: Secondary | ICD-10-CM

## 2018-05-19 NOTE — Therapy (Signed)
[place] MAIN [facility] SERVICES [address] [place], [place], [address] Phone: [phone]   Fax:  [phone]  Physical Therapy Treatment  Patient Details  Name: Curtis Powell MRN: [id number] Date of Birth: April 12, 1965 No data recorded  Encounter Date: 05/19/2018  PT End of Session - 05/19/18 1437    Visit Number  101    Number of Visits  115    Date for PT Re-Evaluation  06/09/18    Authorization Type  last goals: 03/27/19    PT Start Time  1435    PT Stop Time  1515    PT Time Calculation (min)  40 min    Equipment Utilized During Treatment  Gait belt    Activity Tolerance  Patient tolerated treatment well    Behavior During Therapy  WFL for tasks assessed/performed       Past Medical History:  Diagnosis Date  . GERD (gastroesophageal reflux disease)   . Hyperlipidemia   . Hypertension   . Obesity   . Stroke [facility])     Past Surgical History:  Procedure Laterality Date  . BRAIN SURGERY      There were no vitals filed for this visit.  Subjective Assessment - 05/19/18 1437    Subjective  Pt doing well this date, reports no pain today, no other updates.     Pertinent History   Patient is a pleasant 53 year old male who presents to physical therapy for weakness and immobility secondary to CVA.  Had a stroke in Feb 2018. He was previously fully independent, but this stroke caused severe residual deficits, mainly on the right side as well as speech, and now he is unable to walk or perform most of his ADLs on his own. Entire right side is very weak. He still has a little difficulty with speech but his swallowing is improved to baseline. His mom had to move in with him and now is his main caregiver.     Patient Stated Goals  Walk without walker, walk further with walker, get some strength in back     Currently in Pain?  No/denies          TREATMENT   Ther-ex  Seated clams red tband 2 x 20 bilateral, ball between feet Seated  marches 2 x 20 bilateral with red tband; Seated ball kicks with therapist to promote LAQ x 20 bouts bilateral; STS without UE support from wheelchair with cushion and then Airex on seat 2 x 10; Seated rows with red tband encouraging thoracic extension and scap retraction 2 x 10; Seated heel raises with manual resistance 2 x 10 bilateral; Straight plane AMB in // bars with UE support x 3 laps;  Lateral side stepping in // bars x 2 laps;   Pt educated throughout session about proper posture and technique with exercises. Improved exercise technique, movement at target joints, use of target muscles after min to mod verbal, visual, tactile cues.    Patientdemonstrates good motivation today. He does well with all exercises prescribed during session however he continues to require extensive cues to prevent him from falling posteriorly during transfers and ambulation. When patient's goals were last updated he continued to demonstrate steady progress however he will need updated goals at an upcoming visit soon. Hewill continue to benefit from skilled therapeutic intervention to address deficits in strength, activity tolerance, balance, and mobility for improved QOL and function.  PT Short Term Goals - 03/26/18 1553      PT SHORT TERM GOAL #1   Title  Patient will be independent in home exercise program to improve strength/mobility for better functional independence with ADLs.    Baseline  hep compliant    Time  2    Period  Weeks    Status  Achieved      PT SHORT TERM GOAL #2   Title  Patient will require min cueing for STS transfer with CGA for increased independence with mobility.     Baseline  CGA    Time  2    Period  Weeks    Status  Achieved      PT SHORT TERM GOAL #3   Title  Patient will maintain upright posture for > 15 seconds to demonstrate strengthened postural control muscualture     Baseline  18 seconds     Time  2    Period  Weeks     Status  Achieved        PT Long Term Goals - 03/26/18 1553      PT LONG TERM GOAL #1   Title  Patient will perform bed mobility with Supervision to increase independent mobility    Baseline  able to perform mod I for increased time , occasional Min A for scooting up in bed; 7/16: min guard for safety with increased effort and time, cues to scoot back in bed before sit>supine 9/12: can do it independently but with difficulty    Time  8    Period  Weeks    Status  Achieved      PT LONG TERM GOAL #2   Title  Patient (< 52 years old) will complete five times sit to stand test in < 10 seconds indicating an increased LE strength and improved balance.    Baseline  2/18: 30 seconds from plinth chair height single UE on seat and single on walker ; 32 seconds from plinth with single UE on seat and single on walker. 4/10: 26 seconds from [facility] with one hand on walker5/29: 22.2 seconds with one hand on walker one hand on WC; 7/16: one hand on walker one hand on WC 18.96 seconds; pt achieving 75% of full upright standing 8/22:one hand on walker one hand on WC 18 seconds; pt achieving 75% of full upright standing  9/12: 18 seconds econds; pt achieving 75% of full upright standing 8/22:one hand on walker one hand on WC 10/15: 17seconds; pt achieving 75% of full upright posture 11/7: 16 seconds; pt achieving 75 upright posture 12/19: 15 seconds 80% upright posture    Time  8    Period  Weeks    Status  Partially Met    Target Date  04/09/18      PT LONG TERM GOAL #3   Title  Patient will ambulate 40 ft with walker with no breaks to allow for increased mobility within home.     Baseline  12/26: 29 ft with walker and WC follow with no rest breaks; 2/18: multiple trials previous session of >100 ft     Time  8    Period  Weeks    Status  Achieved      PT LONG TERM GOAL #4   Title  Patient will increase BLE gross strength to 4+/5 as to improve functional strength for independent gait, increased standing  tolerance and increased ADL ability.    Baseline   4-/5 RLE, 10/15:  4/5 RLE 11/7: 4/5 RLE    Time  8    Period  Weeks    Status  Partially Met    Target Date  04/09/18      PT LONG TERM GOAL #5   Title  Patient will increase lower extremity functional scale to >40/80 to demonstrate improved functional mobility and increased tolerance with ADLs.     Baseline  14/80; 12/26: 21/80; 2/18: 28/80  4/10; 25/80 5/29: 27/80; 7/16: 29/80 8/22: 30/80  9/12: 26/80 (feeling more tired due to moving) 10/15: 37/80 11/7: 23/80    Time  8    Period  Weeks    Status  Partially Met    Target Date  04/09/18      PT LONG TERM GOAL #6   Title  Patient will walk through a door involving one small step up/down to increase functional independence.     Baseline  require CGA  BUE support in // bars at this time 10/15: required BUE and CGA in //bars    Time  8    Period  Weeks    Status  Deferred      PT LONG TERM GOAL #7   Title  Patient will ambulate 40 ft with least assistive device and Supervision with no breaks to allow for increased mobility within home.     Baseline  requires use of walker and CGA     Time  8    Period  Weeks    Status  Achieved      PT LONG TERM GOAL #8   Title  Patient will perform 10 MWT in < 1 minute to demonstrate improved ambulatory velocity and capacity.     Baseline  4/10: 2 minutes 45 seconds; 5/29: 2 minute 31 seconds; 3 minutes 22 seconds 8/22: 3 minutes 2 seconds  9/12:  2 minutes and 43 seconds 10/15: 63mnute 12 seconds 11/7: 1 minute 21 seconds 12/19: 1 min 16 seconds     Time  8    Period  Weeks    Status  Partially Met    Target Date  04/09/18      PT LONG TERM GOAL  #9   TITLE  Patient will increase Berg Balance score by > 6 points (26/56) to demonstrate decreased fall risk during functional activities.    Baseline  9/12: 20/56 10/15: 21/56 11/7: 20/56 12/19: 22/56     Time  8    Period  Weeks    Status  Partially Met    Target Date  04/09/18      PT LONG  TERM GOAL  #10   TITLE  Patient (< 727years old) will complete five times sit to stand test with full upright posture in < 30 seconds indicating an increased glute activation, LE strength and improved balance.    Baseline  10/15: 37seconds 11/7: 20 seconds     Time  8    Period  Weeks    Status  Achieved      PT LONG TERM GOAL  #11   TITLE  Patient will ambulate 100 ft with least assistive device and Supervision with no breaks to allow for increased mobility within home.     Baseline  10/17: 876f11/7: 10067f  Time  8    Period  Weeks    Status  Achieved      PT LONG TERM GOAL  #12   TITLE  Patient (< 60 51ars old) will complete  five times sit to stand test with full upright posture in < 15 seconds indicating increased glute activation, LE strength and improved balance.    Baseline  11/7: 20 seconds 12/19: 18 seconds     Time  8    Period  Weeks    Status  Partially Met    Target Date  04/09/18      PT LONG TERM GOAL  #13   TITLE  Patient will ambulate 150 ft with least assistive device and Supervision with no breaks to allow for increased mobility within home.     Baseline  11/7: 135f 12/19: 115 ft     Time  8    Period  Weeks    Status  Partially Met    Target Date  04/09/18            Plan - 05/19/18 1437    Clinical Impression Statement  Patientdemonstrates good motivation today. He does well with all exercises prescribed during session however he continues to require extensive cues to prevent him from falling posteriorly during transfers and ambulation. When patient's goals were last updated he continued to demonstrate steady progress however he will need updated goals at an upcoming visit soon. Hewill continue to benefit from skilled therapeutic intervention to address deficits in strength, activity tolerance, balance, and mobility for improved QOL and function.    Rehab Potential  Fair    Clinical Impairments Affecting Rehab Potential  hx of HTN, HLD, CVA, learning  disability, Diabetes, brain tumor,     PT Frequency  2x / week    PT Duration  8 weeks    PT Treatment/Interventions  ADLs/Self Care Home Management;Aquatic Therapy;Ultrasound;Moist Heat;Traction;DME Instruction;Gait training;Stair training;Functional mobility training;Therapeutic activities;Therapeutic exercise;Orthotic Fit/Training;Neuromuscular re-education;Balance training;Patient/family education;Manual techniques;Wheelchair mobility training;Passive range of motion;Energy conservation;Taping;Visual/perceptual remediation/compensation    PT Next Visit Plan  Update outcome measures/goals, balance and stability and ambulation, anterior weight shift hold    PT Home Exercise Plan  TrA contraction, glute sets: needs updates. Potentially some time in supine flat in hospital bed, to improve hip extension deficits.     Consulted and Agree with Plan of Care  Patient       Patient will benefit from skilled therapeutic intervention in order to improve the following deficits and impairments:  Abnormal gait, Decreased activity tolerance, Decreased balance, Decreased knowledge of precautions, Decreased endurance, Decreased coordination, Decreased knowledge of use of DME, Decreased mobility, Decreased range of motion, Difficulty walking, Decreased safety awareness, Decreased strength, Impaired flexibility, Impaired perceived functional ability, Impaired tone, Postural dysfunction, Improper body mechanics, Pain  Visit Diagnosis: Muscle weakness (generalized)  Other lack of coordination     Problem List Patient Active Problem List   Diagnosis Date Noted  . [place]  . Left-sided weakness 05/01/2016   JPhillips GroutPT, DPT, GCS  Annali Lybrand 05/20/2018, 10:38 AM  [place] [facility] [address] [place] [address]Phone: [phone]  Fax:  [phone] Name: BDONNOVAN STAMOURMRN:  [id number] of Birth: [birth year]

## 2018-05-19 NOTE — Therapy (Addendum)
[place] MAIN [facility] SERVICES [address] [place], [place], [address] Phone: [phone]   Fax:  [phone]  Occupational Therapy Treatment  Patient Details  Name: AMI THORNSBERRY MRN: [id number] Date of Birth: [birth year] No data recorded  Encounter Date: 05/19/2018  OT End of Session - 05/19/18 1407    Visit Number  42    Number of Visits  113    Date for OT Re-Evaluation  07/16/18    Authorization Type  Progress reporting period starting 04/23/2018    Equipment Utilized During Treatment  Long handled shoe horn    Activity Tolerance  Patient tolerated treatment well    Behavior During Therapy  [facility] for tasks assessed/performed       Past Medical History:  Diagnosis Date  . GERD (gastroesophageal reflux disease)   . Hyperlipidemia   . Hypertension   . Obesity   . Stroke [facility])     Past Surgical History:  Procedure Laterality Date  . BRAIN SURGERY      There were no vitals filed for this visit.  Subjective Assessment - 05/19/18 1357    Subjective   Pt. reports he wants to be able to get back to running.    Pertinent History  Pt. is a 53 y.o. male who suffered a CVA on 05/01/2016. Pt. was admitted to the hospital. Once discharged, he received Home Health PT and OT services for about a month. Pt. has had multiple CVAs over the past 8 years, and has had multiple falls in the past 6 months. Pt. resides in an apartment. Pt. has caregivers for 80 hours. Pt.'s mother stays with pt. at night, and assists with IADL tasks.      Patient Stated Goals  To be able to throw a ball and dribble a ball, do as much as I can for myself.     Currently in Pain?  No/denies      OT TREATMENT    Therapeutic Exercise:  Pt. worked on right shoulder active ROM, and reaching. Pt. worked on moving objects through [employer] over 3 vertical rungs with less compensating, and leaning with the trunk this date. Pt. worked on moving the large rings through three  horizontal rungs using the [employer].  Selfcare:  Pt. worked on sewing skills formulating a straight stitch with a medium sized sewing needle. Pt. required less cues today for needle placement on the fabric. Pt. requires reading glasses, and increased time. Pt. continues to work on formulating a straight stitch in order to be able to hem clothes, and sew a button onto a shirt.                        OT Education - 05/19/18 1359    Education provided  Yes    Person(s) Educated  Patient    Methods  Explanation;Demonstration    Comprehension  Verbalized understanding;Returned demonstration;Verbal cues required       OT Short Term Goals - 03/03/17 1459      OT SHORT TERM GOAL #1   Title  `        OT Long Term Goals - 04/23/18 1421      OT LONG TERM GOAL #4   Title  Pt. will perform self dressing with minA and A/E as needed.    Baseline  Pt. continues to improve LB dressing techniques while seated. Pt required extensive time and mod A to complete LB dressing.  Pt. requires min A for UB dressing.    Time  12    Period  Weeks    Status  On-going    Target Date  07/16/18      Long Term Additional Goals   Additional Long Term Goals  Yes      OT LONG TERM GOAL #6   Title  Pt. will write his name efficiently with 100% legibility    Baseline  04/23/2018: Pt to continues to work to increase legibility and efficiency when writing name to sign in when riding the [place]    Time  12    Period  Weeks    Status  On-going    Target Date  07/16/18      OT LONG TERM GOAL #7   Title  Pt will independently and consistently follow HEP to increase UE strength to increase functional independence    Baseline  04/23/2018: Pt to continues to work towards independence with exercises    Time  12    Period  Weeks    Status  On-going    Target Date  07/16/18      OT LONG TERM GOAL #8   Title  Pt. will require supervision ironing a shirt.    Baseline  04/23/2018: Pt. is unable to  complete.    Time  12    Period  Weeks    Status  New    Target Date  07/16/18      OT LONG TERM GOAL  #9   Baseline  Pt. will independently be able to sew a button onto a shirt.    Time  12    Period  Weeks    Status  New    Target Date  07/16/18      OT LONG TERM GOAL  #10   TITLE  Pt. will independently be able to throw a ball    Baseline  04/23/2018: Pt. is unable.    Time  12    Period  Weeks    Status  New    Target Date  07/16/18      OT LONG TERM GOAL  #11   TITLE  Pt. will improve UE functional reaching to be able to independently use his RUE to hand his clothes in the closest.    Baseline  04/23/2018: Pt. is unable to hang his clothes up in his closet.    Time  12    Period  Weeks    Status  New    Target Date  07/16/18            Plan - 05/19/18 1400    Clinical Impression Statement  Pt. reports that it is much easier to reach, and pull up the covers on his bed, hike his pants, pour juice from a bottle, open a soda can, as well a can of veggies. Pt. continues to work on improving RUE strength, functional reaching, and [facility] skills. Pt. continues to work on improving RUE functioning for improved engagement in ADLs, and IADL functioing icluding dressing, sewing, and ironing.     Occupational performance deficits (Please refer to evaluation for details):  ADL's;IADL's    Rehab Potential  Fair    OT Frequency  2x / week    OT Duration  12 weeks    OT Treatment/Interventions  Self-care/ADL training;Patient/family education;DME and/or AE instruction;Therapeutic exercise;Moist Heat;Neuromuscular education    Clinical Decision Making  Several treatment options, min-mod task modification necessary  Consulted and Agree with Plan of Care  Patient       Patient will benefit from skilled therapeutic intervention in order to improve the following deficits and impairments:  Pain, Decreased cognition, Decreased activity tolerance, Impaired UE functional use, Decreased range  of motion, Decreased coordination, Decreased endurance  Visit Diagnosis: Muscle weakness (generalized)  Other lack of coordination    Problem List Patient Active Problem List   Diagnosis Date Noted  . Acute CVA (cerebrovascular accident) ([place]) 05/02/2016  . Left-sided weakness 05/01/2016    Harrel Carina, MS, OTR/L 05/19/2018, 2:21 PM  [place] MAIN [facility] SERVICES [address] [place], [place], [address] Phone: [phone]   Fax:  [phone]  Name: ALEXANDROS EWAN MRN: [id number] Date of Birth: Jul 07, 1965

## 2018-05-21 ENCOUNTER — Encounter: Payer: Self-pay | Admitting: Occupational Therapy

## 2018-05-21 ENCOUNTER — Ambulatory Visit: Payer: Medicare HMO | Admitting: Occupational Therapy

## 2018-05-21 ENCOUNTER — Ambulatory Visit: Payer: Medicare HMO

## 2018-05-21 DIAGNOSIS — I69351 Hemiplegia and hemiparesis following cerebral infarction affecting right dominant side: Secondary | ICD-10-CM

## 2018-05-21 DIAGNOSIS — M6281 Muscle weakness (generalized): Secondary | ICD-10-CM

## 2018-05-21 DIAGNOSIS — R278 Other lack of coordination: Secondary | ICD-10-CM

## 2018-05-21 DIAGNOSIS — R2689 Other abnormalities of gait and mobility: Secondary | ICD-10-CM

## 2018-05-21 NOTE — Therapy (Signed)
[place] MAIN [facility] SERVICES [address] [place], [place], [address] Phone: [phone]   Fax:  [phone]  Physical Therapy Treatment  Patient Details  Name: Curtis Powell MRN: [id number] Date of Birth: 1965/07/27 No data recorded  Encounter Date: 05/21/2018  PT End of Session - 05/21/18 1453    Visit Number  102    Number of Visits  115    Date for PT Re-Evaluation  06/09/18    Authorization Type  last goals: 03/27/19    PT Start Time  1432    PT Stop Time  1512    PT Time Calculation (min)  40 min    Activity Tolerance  Patient tolerated treatment well;No increased pain;Patient limited by fatigue    Behavior During Therapy  [facility] for tasks assessed/performed       Past Medical History:  Diagnosis Date  . GERD (gastroesophageal reflux disease)   . Hyperlipidemia   . Hypertension   . Obesity   . Stroke [facility])     Past Surgical History:  Procedure Laterality Date  . BRAIN SURGERY      There were no vitals filed for this visit.   Treatment this date: -STS without UE support from wheelchair with cushion and then Airex on seat 2 x 10; -supine on plinth, long duration, lowload hip flexor stretching to facilitate upright posture ROM standing -Cervical retraction into 2 pillows 2x15x3secH, tactile cues for avoidance of extreme cervical extension  -P/ROM BLE FABER Stretch 2x30sec, adductor dropout stretch 2x30sec -Supine BLE elevated on 2 pillows x5 minutes -Supine BLE elevated on 1 pillows x43mnutes  -Supine BLE elevated 1 pillow, straight knee gluteal sets (hip extension) 10x3sec -Supine BLE flat with trunk x5 minutes -Supine BUE shoulder extension, VC for scapular retraction 10x3sec  -Supine Wand flexion c 1lb straight weight down to 2 pillows overhead 15x3secH -AMB [id number], VC for upright posture, Supervision    PT Short Term Goals - 03/26/18 1553      PT SHORT TERM GOAL #1   Title  Patient will be independent in home  exercise program to improve strength/mobility for better functional independence with ADLs.    Baseline  hep compliant    Time  2    Period  Weeks    Status  Achieved      PT SHORT TERM GOAL #2   Title  Patient will require min cueing for STS transfer with CGA for increased independence with mobility.     Baseline  CGA    Time  2    Period  Weeks    Status  Achieved      PT SHORT TERM GOAL #3   Title  Patient will maintain upright posture for > 15 seconds to demonstrate strengthened postural control muscualture     Baseline  18 seconds     Time  2    Period  Weeks    Status  Achieved        PT Long Term Goals - 03/26/18 1553      PT LONG TERM GOAL #1   Title  Patient will perform bed mobility with Supervision to increase independent mobility    Baseline  able to perform mod I for increased time , occasional Min A for scooting up in bed; 7/16: min guard for safety with increased effort and time, cues to scoot back in bed before sit>supine 9/12: can do it independently but with difficulty    Time  8  Period  Weeks    Status  Achieved      PT LONG TERM GOAL #2   Title  Patient (< 75 years old) will complete five times sit to stand test in < 10 seconds indicating an increased LE strength and improved balance.    Baseline  2/18: 30 seconds from plinth chair height single UE on seat and single on walker ; 32 seconds from plinth with single UE on seat and single on walker. 4/10: 26 seconds from [facility] with one hand on walker5/29: 22.2 seconds with one hand on walker one hand on WC; 7/16: one hand on walker one hand on WC 18.96 seconds; pt achieving 75% of full upright standing 8/22:one hand on walker one hand on WC 18 seconds; pt achieving 75% of full upright standing  9/12: 18 seconds econds; pt achieving 75% of full upright standing 8/22:one hand on walker one hand on WC 10/15: 17seconds; pt achieving 75% of full upright posture 11/7: 16 seconds; pt achieving 75 upright posture 12/19: 15  seconds 80% upright posture    Time  8    Period  Weeks    Status  Partially Met    Target Date  04/09/18      PT LONG TERM GOAL #3   Title  Patient will ambulate 40 ft with walker with no breaks to allow for increased mobility within home.     Baseline  12/26: 29 ft with walker and WC follow with no rest breaks; 2/18: multiple trials previous session of >100 ft     Time  8    Period  Weeks    Status  Achieved      PT LONG TERM GOAL #4   Title  Patient will increase BLE gross strength to 4+/5 as to improve functional strength for independent gait, increased standing tolerance and increased ADL ability.    Baseline   4-/5 RLE, 10/15: 4/5 RLE 11/7: 4/5 RLE    Time  8    Period  Weeks    Status  Partially Met    Target Date  04/09/18      PT LONG TERM GOAL #5   Title  Patient will increase lower extremity functional scale to >40/80 to demonstrate improved functional mobility and increased tolerance with ADLs.     Baseline  14/80; 12/26: 21/80; 2/18: 28/80  4/10; 25/80 5/29: 27/80; 7/16: 29/80 8/22: 30/80  9/12: 26/80 (feeling more tired due to moving) 10/15: 37/80 11/7: 23/80    Time  8    Period  Weeks    Status  Partially Met    Target Date  04/09/18      PT LONG TERM GOAL #6   Title  Patient will walk through a door involving one small step up/down to increase functional independence.     Baseline  require CGA  BUE support in // bars at this time 10/15: required BUE and CGA in //bars    Time  8    Period  Weeks    Status  Deferred      PT LONG TERM GOAL #7   Title  Patient will ambulate 40 ft with least assistive device and Supervision with no breaks to allow for increased mobility within home.     Baseline  requires use of walker and CGA     Time  8    Period  Weeks    Status  Achieved      PT LONG TERM GOAL #8  Title  Patient will perform 10 MWT in < 1 minute to demonstrate improved ambulatory velocity and capacity.     Baseline  4/10: 2 minutes 45 seconds; 5/29: 2  minute 31 seconds; 3 minutes 22 seconds 8/22: 3 minutes 2 seconds  9/12:  2 minutes and 43 seconds 10/15: 46mnute 12 seconds 11/7: 1 minute 21 seconds 12/19: 1 min 16 seconds     Time  8    Period  Weeks    Status  Partially Met    Target Date  04/09/18      PT LONG TERM GOAL  #9   TITLE  Patient will increase Berg Balance score by > 6 points (26/56) to demonstrate decreased fall risk during functional activities.    Baseline  9/12: 20/56 10/15: 21/56 11/7: 20/56 12/19: 22/56     Time  8    Period  Weeks    Status  Partially Met    Target Date  04/09/18      PT LONG TERM GOAL  #10   TITLE  Patient (< 634years old) will complete five times sit to stand test with full upright posture in < 30 seconds indicating an increased glute activation, LE strength and improved balance.    Baseline  10/15: 37seconds 11/7: 20 seconds     Time  8    Period  Weeks    Status  Achieved      PT LONG TERM GOAL  #11   TITLE  Patient will ambulate 100 ft with least assistive device and Supervision with no breaks to allow for increased mobility within home.     Baseline  10/17: 829f11/7: 10040f  Time  8    Period  Weeks    Status  Achieved      PT LONG TERM GOAL  #12   TITLE  Patient (< 60 83ars old) will complete five times sit to stand test with full upright posture in < 15 seconds indicating increased glute activation, LE strength and improved balance.    Baseline  11/7: 20 seconds 12/19: 18 seconds     Time  8    Period  Weeks    Status  Partially Met    Target Date  04/09/18      PT LONG TERM GOAL  #13   TITLE  Patient will ambulate 150 ft with least assistive device and Supervision with no breaks to allow for increased mobility within home.     Baseline  11/7: 100f66f/19: 115 ft     Time  8    Period  Weeks    Status  Partially Met    Target Date  04/09/18            Plan - 05/21/18 1455    Clinical Impression Statement  Started with mobility and stretching work to address  postural limitations and flexed standing posture to allow for better upright potential an improve energy conservation. Hip flexors are gradually stretche dovertime with good tolerance, but pt report she doe snot typically obtain a flat posture as such at night when sleeping; he is encouraged to attempt during the day to stretch hip flexors. Thereafter obtains a better standing posture, but requires soem times adjusting balance to this new position. Pt progressing nicely overall.     Rehab Potential  Fair    Clinical Impairments Affecting Rehab Potential  hx of HTN, HLD, CVA, learning disability, Diabetes, brain tumor,     PT  Frequency  2x / week    PT Duration  8 weeks    PT Treatment/Interventions  ADLs/Self Care Home Management;Aquatic Therapy;Ultrasound;Moist Heat;Traction;DME Instruction;Gait training;Stair training;Functional mobility training;Therapeutic activities;Therapeutic exercise;Orthotic Fit/Training;Neuromuscular re-education;Balance training;Patient/family education;Manual techniques;Wheelchair mobility training;Passive range of motion;Energy conservation;Taping;Visual/perceptual remediation/compensation    PT Next Visit Plan  Update outcome measures/goals, balance and stability and ambulation, anterior weight shift hold    PT Home Exercise Plan  TrA contraction, glute sets: needs updates. Potentially some time in supine flat in hospital bed, to improve hip extension deficits.     Consulted and Agree with Plan of Care  Patient       Patient will benefit from skilled therapeutic intervention in order to improve the following deficits and impairments:     Visit Diagnosis: Muscle weakness (generalized)  Other lack of coordination  Other abnormalities of gait and mobility  Hemiplegia and hemiparesis following cerebral infarction affecting right dominant side [facility])     Problem List Patient Active Problem List   Diagnosis Date Noted  . Acute CVA (cerebrovascular accident) ([place])  05/02/2016  . Left-sided weakness 05/01/2016    Alanta Scobey C 05/21/2018, 3:07 PM  3:07 PM, 05/21/18 Etta Grandchild, PT, DPT Physical Therapist - [facility]  Outpatient Physical Therapy- [place] MAIN [facility] SERVICES [address] [place], [place], [address] Phone: [phone]   Fax:  [phone]  Name: Curtis Powell MRN: [id number] Date of Birth: 1965/10/30

## 2018-05-21 NOTE — Therapy (Signed)
[place] MAIN [facility] SERVICES [address] [place], [place], [address] Phone: [phone]   Fax:  [phone]  Occupational Therapy Treatment  Patient Details  Name: Curtis Powell MRN: [id number] Date of Birth: 11-Oct-1965 No data recorded  Encounter Date: 05/21/2018  OT End of Session - 05/21/18 1325    Visit Number  11    Number of Visits  113    Date for OT Re-Evaluation  07/16/18    Authorization Type  Progress reporting period starting 04/23/2018    OT Start Time  1315    OT Stop Time  1400    OT Time Calculation (min)  45 min    Equipment Utilized During Treatment  Long handled shoe horn    Activity Tolerance  Patient tolerated treatment well    Behavior During Therapy  [facility] for tasks assessed/performed       Past Medical History:  Diagnosis Date  . GERD (gastroesophageal reflux disease)   . Hyperlipidemia   . Hypertension   . Obesity   . Stroke [facility])     Past Surgical History:  Procedure Laterality Date  . BRAIN SURGERY      There were no vitals filed for this visit.  Subjective Assessment - 05/21/18 1321    Subjective   Pt. was very early today.    Pertinent History  Pt. is a 53 y.o. male who suffered a CVA on 05/01/2016. Pt. was admitted to the hospital. Once discharged, he received Home Health PT and OT services for about a month. Pt. has had multiple CVAs over the past 8 years, and has had multiple falls in the past 6 months. Pt. resides in an apartment. Pt. has caregivers for 80 hours. Pt.'s mother stays with pt. at night, and assists with IADL tasks.      Patient Stated Goals  To be able to throw a ball and dribble a ball, do as much as I can for myself.     Currently in Pain?  No/denies    Pain Score  5     Pain Location  Knee    Pain Orientation  Left    Pain Descriptors / Indicators  Sharp    Pain Type  Acute pain    Pain Onset  Today       OT TREATMENT    Therapeutic Exercise:  Pt. worked on right  shoulder active ROM, and reaching. Pt. worked on moving objects through [employer] over 3 vertical rungs with less compensating, and leaning with the trunk this date. Pt. Worked on reaching tasks to be able to reach for clothes in the closet, perform morning ADLs, and iron.  Selfcare:  Pt. worked on sewing skills formulating a straight stitch with a medium sized sewing needle. Pt. required less cues today for needle placement on the fabric. Pt. requires reading glasses, and increased time. Pt. continues to work on formulating a straight stitch in order to be able to hem clothes, and sew a button onto a shirt.                        OT Education - 05/21/18 1325    Education provided  Yes    Education Details  RUE Functioning    Person(s) Educated  Patient    Methods  Explanation;Demonstration    Comprehension  Verbalized understanding;Returned demonstration;Verbal cues required       OT Short Term Goals - 03/03/17 1459  OT SHORT TERM GOAL #1   Title  `        OT Long Term Goals - 04/23/18 1421      OT LONG TERM GOAL #4   Title  Pt. will perform self dressing with minA and A/E as needed.    Baseline  Pt. continues to improve LB dressing techniques while seated. Pt required extensive time and mod A to complete LB dressing. Pt. requires min A for UB dressing.    Time  12    Period  Weeks    Status  On-going    Target Date  07/16/18      Long Term Additional Goals   Additional Long Term Goals  Yes      OT LONG TERM GOAL #6   Title  Pt. will write his name efficiently with 100% legibility    Baseline  04/23/2018: Pt to continues to work to increase legibility and efficiency when writing name to sign in when riding the [place]    Time  12    Period  Weeks    Status  On-going    Target Date  07/16/18      OT LONG TERM GOAL #7   Title  Pt will independently and consistently follow HEP to increase UE strength to increase functional independence     Baseline  04/23/2018: Pt to continues to work towards independence with exercises    Time  12    Period  Weeks    Status  On-going    Target Date  07/16/18      OT LONG TERM GOAL #8   Title  Pt. will require supervision ironing a shirt.    Baseline  04/23/2018: Pt. is unable to complete.    Time  12    Period  Weeks    Status  New    Target Date  07/16/18      OT LONG TERM GOAL  #9   Baseline  Pt. will independently be able to sew a button onto a shirt.    Time  12    Period  Weeks    Status  New    Target Date  07/16/18      OT LONG TERM GOAL  #10   TITLE  Pt. will independently be able to throw a ball    Baseline  04/23/2018: Pt. is unable.    Time  12    Period  Weeks    Status  New    Target Date  07/16/18      OT LONG TERM GOAL  #11   TITLE  Pt. will improve UE functional reaching to be able to independently use his RUE to hand his clothes in the closest.    Baseline  04/23/2018: Pt. is unable to hang his clothes up in his closet.    Time  12    Period  Weeks    Status  New    Target Date  07/16/18            Plan - 05/21/18 1326    Clinical Impression Statement Pt. continues to present with limited RUE functional reaching which affects his ability to complete basic ADL, and IADL functioning. Pt. continues to work on improving RUE functional reaching, UE strength, and [place] skills in order to be able to perform ADL, and IADL tasks inclluding: self dressing, sewing, ironing, and ADL/IADL tasks.     Occupational performance deficits (Please refer to evaluation for details):  ADL's;IADL's    Rehab Potential  Fair    OT Frequency  2x / week    OT Duration  12 weeks    OT Treatment/Interventions  Self-care/ADL training;Patient/family education;DME and/or AE instruction;Therapeutic exercise;Moist Heat;Neuromuscular education    Clinical Decision Making  Several treatment options, min-mod task modification necessary    Consulted and Agree with Plan of Care  Patient        Patient will benefit from skilled therapeutic intervention in order to improve the following deficits and impairments:  Pain, Decreased cognition, Decreased activity tolerance, Impaired UE functional use, Decreased range of motion, Decreased coordination, Decreased endurance  Visit Diagnosis: Muscle weakness (generalized)  Other lack of coordination    Problem List Patient Active Problem List   Diagnosis Date Noted  . Acute CVA (cerebrovascular accident) ([place]) 05/02/2016  . Left-sided weakness 05/01/2016    Harrel Carina, MS, OTR/L 05/21/2018, 1:33 PM  [place] MAIN [facility] SERVICES [address] [place], [place], [address] Phone: [phone]   Fax:  [phone]  Name: Curtis Powell MRN: [id number] Date of Birth: [birth year]

## 2018-05-25 ENCOUNTER — Ambulatory Visit: Payer: Medicare HMO | Admitting: Occupational Therapy

## 2018-05-25 ENCOUNTER — Encounter: Payer: Self-pay | Admitting: Occupational Therapy

## 2018-05-25 ENCOUNTER — Ambulatory Visit: Payer: Medicare HMO

## 2018-05-25 DIAGNOSIS — I69351 Hemiplegia and hemiparesis following cerebral infarction affecting right dominant side: Secondary | ICD-10-CM

## 2018-05-25 DIAGNOSIS — M6281 Muscle weakness (generalized): Secondary | ICD-10-CM | POA: Diagnosis not present

## 2018-05-25 DIAGNOSIS — R278 Other lack of coordination: Secondary | ICD-10-CM

## 2018-05-25 DIAGNOSIS — R2689 Other abnormalities of gait and mobility: Secondary | ICD-10-CM

## 2018-05-25 NOTE — Therapy (Addendum)
[place] MA[facility] SERVICES [address] [place], [place], [address] Phone: [phone]   Fax:  [phone]  Physical Therapy Treatment  Patient Details  Name: Curtis Powell MRN: [id number] Date of Birth: 12-08-1965 No data recorded  Encounter Date: 05/25/2018  PT End of Session - 05/25/18 1520    Visit Number  [id number]    Number of Visits  115    Date for PT Re-Evaluation  06/09/18    Authorization Type  last goals: 03/27/19    PT Start Time  1347    PT Stop Time  1428    PT Time Calculation (min)  41 min    Equipment Utilized During Treatment  Gait belt    Activity Tolerance  Patient tolerated treatment well;No increased pain;Patient limited by fatigue    Behavior During Therapy  [facility] for tasks assessed/performed       Past Medical History:  Diagnosis Date  . GERD (gastroesophageal reflux disease)   . Hyperlipidemia   . Hypertension   . Obesity   . Stroke [facility])     Past Surgical History:  Procedure Laterality Date  . BRAIN SURGERY      There were no vitals filed for this visit.  Subjective Assessment - 05/25/18 1517    Subjective  The patient reports that he is doing well today. He notes that his legs are a little sore today and have been since yesterday. He also notes that he is having some R knee pain, but that ice and walking have helped to relieve some of his pain. He stretches at home consistently with the help of his caregiver.    Pertinent History   Patient is a pleasant 53 year old male who presents to physical therapy for weakness and immobility secondary to CVA.  Had a stroke in Feb 2018. He was previously fully independent, but this stroke caused severe residual deficits, mainly on the right side as well as speech, and now he is unable to walk or perform most of his ADLs on his own. Entire right side is very weak. He still has a little difficulty with speech but his swallowing is improved to baseline. His mom had to move in  with him and now is his main caregiver.     Limitations  Sitting;Lifting;Standing;Walking;House hold activities    How long can you sit comfortably?  5 minutes    How long can you stand comfortably?  5 minutes    How long can you walk comfortably?  10 ft    Patient Stated Goals  Walk without walker, walk further with walker, get some strength in back     Currently in Pain?  Yes    Pain Score  5     Pain Location  Knee    Pain Orientation  Right    Pain Descriptors / Indicators  Sore    Pain Type  Acute pain    Pain Onset  Yesterday    Pain Frequency  Intermittent    Pain Relieving Factors  ice, ambulation      TREATMENT:  -Ambulation 1x86 feet on solid surface using rolling walker with wheelchair follow and use of gait belt with minimal assistance x1; at 8 feet, patient demonstrates posterior loss of balance that requires moderate assistance x1 to correct to sit in chair; patient demonstrates uneven step length bilaterally and increased trunk flexion throughout bout; patient requires frequent verbal cues for upright posture   -Supine on plinth, long  duration, lowload hip flexor stretching to facilitate upright posture ROM standing -Supine BLE flat with trunk x5 minutes -Supine glute sets 2x10 -Supine PROM BLE FABER Stretch 2x60sec -Supine adductor dropout stretch 2x60sec  -Supine Thomas stretch off edge of mat RLE 1x60 seconds -Sidelying hip flexor stretch LLE 1x60 seconds  -Following performance of stretching, patient ambulates 1x40 feet on solid surface using rolling walker with wheelchair follow and use of gait belt with minimal assistance x1; patient does not have any losses of balance requiring external assistance to correct; patient reports that he feels 'better' walking following stretching as compared to before; patient demonstrates slightly more upright posture and increased step length bilaterally (and more consistently); requires fewer verbal cues for upright posture   Bed  mobility and toileting:  -Patient performs sit to supine transfer with minimal assistance x1 for patient's legs -Patient performs scooting towards EOB in supine with minimal assistance x2; patient requires extra time -Patient performs supine to sidelying transfer with minimal assistance x2 -Patient performs sidelying to sit transfer with minimal assistance x2  -Patient performs sit to stand transfers x4 with minimal assistance x1 -Patient preforms stand to sit transfers x4 with minimal assistance x1 - Patient with CGA for toileting needs, no physical assist needed     PT Education - 05/25/18 1520    Education provided  Yes    Education Details  safety with mobility, stretching, upright posture with ambulation    Person(s) Educated  Patient    Methods  Explanation;Demonstration;Tactile cues;Verbal cues    Comprehension  Verbalized understanding;Returned demonstration;Verbal cues required;Tactile cues required;Need further instruction       PT Short Term Goals - 03/26/18 1553      PT SHORT TERM GOAL #1   Title  Patient will be independent in home exercise program to improve strength/mobility for better functional independence with ADLs.    Baseline  hep compliant    Time  2    Period  Weeks    Status  Achieved      PT SHORT TERM GOAL #2   Title  Patient will require min cueing for STS transfer with CGA for increased independence with mobility.     Baseline  CGA    Time  2    Period  Weeks    Status  Achieved      PT SHORT TERM GOAL #3   Title  Patient will maintain upright posture for > 15 seconds to demonstrate strengthened postural control muscualture     Baseline  18 seconds     Time  2    Period  Weeks    Status  Achieved        PT Long Term Goals - 03/26/18 1553      PT LONG TERM GOAL #1   Title  Patient will perform bed mobility with Supervision to increase independent mobility    Baseline  able to perform mod I for increased time , occasional Min A for  scooting up in bed; 7/16: min guard for safety with increased effort and time, cues to scoot back in bed before sit>supine 9/12: can do it independently but with difficulty    Time  8    Period  Weeks    Status  Achieved      PT LONG TERM GOAL #2   Title  Patient (< 45 years old) will complete five times sit to stand test in < 10 seconds indicating an increased LE strength and improved balance.  Baseline  2/18: 30 seconds from plinth chair height single UE on seat and single on walker ; 32 seconds from plinth with single UE on seat and single on walker. 4/10: 26 seconds from [facility] with one hand on walker5/29: 22.2 seconds with one hand on walker one hand on WC; 7/16: one hand on walker one hand on WC 18.96 seconds; pt achieving 75% of full upright standing 8/22:one hand on walker one hand on WC 18 seconds; pt achieving 75% of full upright standing  9/12: 18 seconds econds; pt achieving 75% of full upright standing 8/22:one hand on walker one hand on WC 10/15: 17seconds; pt achieving 75% of full upright posture 11/7: 16 seconds; pt achieving 75 upright posture 12/19: 15 seconds 80% upright posture    Time  8    Period  Weeks    Status  Partially Met    Target Date  04/09/18      PT LONG TERM GOAL #3   Title  Patient will ambulate 40 ft with walker with no breaks to allow for increased mobility within home.     Baseline  12/26: 29 ft with walker and WC follow with no rest breaks; 2/18: multiple trials previous session of >100 ft     Time  8    Period  Weeks    Status  Achieved      PT LONG TERM GOAL #4   Title  Patient will increase BLE gross strength to 4+/5 as to improve functional strength for independent gait, increased standing tolerance and increased ADL ability.    Baseline   4-/5 RLE, 10/15: 4/5 RLE 11/7: 4/5 RLE    Time  8    Period  Weeks    Status  Partially Met    Target Date  04/09/18      PT LONG TERM GOAL #5   Title  Patient will increase lower extremity functional scale to  >40/80 to demonstrate improved functional mobility and increased tolerance with ADLs.     Baseline  14/80; 12/26: 21/80; 2/18: 28/80  4/10; 25/80 5/29: 27/80; 7/16: 29/80 8/22: 30/80  9/12: 26/80 (feeling more tired due to moving) 10/15: 37/80 11/7: 23/80    Time  8    Period  Weeks    Status  Partially Met    Target Date  04/09/18      PT LONG TERM GOAL #6   Title  Patient will walk through a door involving one small step up/down to increase functional independence.     Baseline  require CGA  BUE support in // bars at this time 10/15: required BUE and CGA in //bars    Time  8    Period  Weeks    Status  Deferred      PT LONG TERM GOAL #7   Title  Patient will ambulate 40 ft with least assistive device and Supervision with no breaks to allow for increased mobility within home.     Baseline  requires use of walker and CGA     Time  8    Period  Weeks    Status  Achieved      PT LONG TERM GOAL #8   Title  Patient will perform 10 MWT in < 1 minute to demonstrate improved ambulatory velocity and capacity.     Baseline  4/10: 2 minutes 45 seconds; 5/29: 2 minute 31 seconds; 3 minutes 22 seconds 8/22: 3 minutes 2 seconds  9/12:  2 minutes and  43 seconds 10/15: 67mnute 12 seconds 11/7: 1 minute 21 seconds 12/19: 1 min 16 seconds     Time  8    Period  Weeks    Status  Partially Met    Target Date  04/09/18      PT LONG TERM GOAL  #9   TITLE  Patient will increase Berg Balance score by > 6 points (26/56) to demonstrate decreased fall risk during functional activities.    Baseline  9/12: 20/56 10/15: 21/56 11/7: 20/56 12/19: 22/56     Time  8    Period  Weeks    Status  Partially Met    Target Date  04/09/18      PT LONG TERM GOAL  #10   TITLE  Patient (< 751years old) will complete five times sit to stand test with full upright posture in < 30 seconds indicating an increased glute activation, LE strength and improved balance.    Baseline  10/15: 37seconds 11/7: 20 seconds     Time  8     Period  Weeks    Status  Achieved      PT LONG TERM GOAL  #11   TITLE  Patient will ambulate 100 ft with least assistive device and Supervision with no breaks to allow for increased mobility within home.     Baseline  10/17: 847f11/7: 10052f  Time  8    Period  Weeks    Status  Achieved      PT LONG TERM GOAL  #12   TITLE  Patient (< 60 49ars old) will complete five times sit to stand test with full upright posture in < 15 seconds indicating increased glute activation, LE strength and improved balance.    Baseline  11/7: 20 seconds 12/19: 18 seconds     Time  8    Period  Weeks    Status  Partially Met    Target Date  04/09/18      PT LONG TERM GOAL  #13   TITLE  Patient will ambulate 150 ft with least assistive device and Supervision with no breaks to allow for increased mobility within home.     Baseline  11/7: [id number]/19: 115 ft     Time  8    Period  Weeks    Status  Partially Met    Target Date  04/09/18            Plan - 05/25/18 1537    Clinical Impression Statement   Following the performance of BLE stretching, the patient displayed better and more consistent gait mechanics and required fewer verbal cues for upright posture and increased step length as compared to at the beginning of session prior to stretching. The patient also mentioned that he felt "better" walking after he stretched. The patient will continue to benefit from skilled PT in order to work towards goals, to increase strength, and to maximize safety and independence with functional mobility.    Rehab Potential  Fair    Clinical Impairments Affecting Rehab Potential  hx of HTN, HLD, CVA, learning disability, Diabetes, brain tumor,     PT Frequency  2x / week    PT Duration  8 weeks    PT Treatment/Interventions  ADLs/Self Care Home Management;Aquatic Therapy;Ultrasound;Moist Heat;Traction;DME Instruction;Gait training;Stair training;Functional mobility training;Therapeutic activities;Therapeutic  exercise;Orthotic Fit/Training;Neuromuscular re-education;Balance training;Patient/family education;Manual techniques;Wheelchair mobility training;Passive range of motion;Energy conservation;Taping;Visual/perceptual remediation/compensation    PT Next Visit Plan  Update outcome  measures/goals, balance and stability and ambulation, anterior weight shift hold    PT Home Exercise Plan  TrA contraction, glute sets: needs updates. Potentially some time in supine flat in hospital bed, to improve hip extension deficits.     Consulted and Agree with Plan of Care  Patient       Patient will benefit from skilled therapeutic intervention in order to improve the following deficits and impairments:  Abnormal gait, Decreased activity tolerance, Decreased balance, Decreased knowledge of precautions, Decreased endurance, Decreased coordination, Decreased knowledge of use of DME, Decreased mobility, Decreased range of motion, Difficulty walking, Decreased safety awareness, Decreased strength, Impaired flexibility, Impaired perceived functional ability, Impaired tone, Postural dysfunction, Improper body mechanics, Pain  Visit Diagnosis: Muscle weakness (generalized)  Other lack of coordination  Other abnormalities of gait and mobility  Hemiplegia and hemiparesis following cerebral infarction affecting right dominant side [facility])     Problem List Patient Active Problem List   Diagnosis Date Noted  . Acute CVA (cerebrovascular accident) ([place]) 05/02/2016  . Left-sided weakness 05/01/2016   Orlean Patten, SPT  05/25/2018, 3:38 PM   Lieutenant Diego PT, DPT 5:13 PM,05/25/18 [phone]  This entire session was performed under direct supervision and direction of a licensed therapist/therapist assistant. I have personally read, edited and approve of the note as written.   [place] MAIN [facility] SERVICES [address] [place], [place], [address] Phone: [phone]    Fax:  [phone]  Name: Curtis Powell MRN: [id number] Date of Birth: Oct 13, 1965

## 2018-05-25 NOTE — Therapy (Signed)
[place] MAIN [facility] SERVICES [address] [place], [place], [address] Phone: [phone]   Fax:  [phone]  Occupational Therapy Progress Note  Dates of reporting period  04/23/2018   to   05/25/2018  Patient Details  Name: Curtis Powell MRN: [id number] Date of Birth: 10/07/1965 No data recorded  Encounter Date: 05/25/2018  OT End of Session - 05/25/18 1307    Visit Number  77    Number of Visits  113    Date for OT Re-Evaluation  07/16/18    Authorization Type  Progress reporting period starting 04/23/2018    OT Start Time  1300    OT Stop Time  1345    OT Time Calculation (min)  45 min    Activity Tolerance  Patient tolerated treatment well    Behavior During Therapy  [facility] for tasks assessed/performed       Past Medical History:  Diagnosis Date  . GERD (gastroesophageal reflux disease)   . Hyperlipidemia   . Hypertension   . Obesity   . Stroke [facility])     Past Surgical History:  Procedure Laterality Date  . BRAIN SURGERY      There were no vitals filed for this visit.  Subjective Assessment - 05/25/18 1307    Subjective  Pt. Reports he had a good weekend.   Pertinent History  Pt. is a 53 y.o. male who suffered a CVA on 05/01/2016. Pt. was admitted to the hospital. Once discharged, he received Home Health PT and OT services for about a month. Pt. has had multiple CVAs over the past 8 years, and has had multiple falls in the past 6 months. Pt. resides in an apartment. Pt. has caregivers for 80 hours. Pt.'s mother stays with pt. at night, and assists with IADL tasks.      Patient Stated Goals  To be able to throw a ball and dribble a ball, do as much as I can for myself.     Currently in Pain?  No/denies         [facility] OT Assessment - 05/25/18 1327      Coordination   Right 9 Hole Peg Test  44    Left 9 Hole Peg Test  39      AROM   Overall AROM Comments  RIght shoulder flexion: 134, shoulder abduction: 94      Hand  Function   Right Hand Grip (lbs)  43    Right Hand Lateral Pinch  22 lbs    Right Hand 3 Point Pinch  15 lbs    Left Hand Grip (lbs)  53    Left Hand Lateral Pinch  23 lbs    Left 3 point pinch  16 lbs        OT TREATMENT:  Measurements were obtained, and goals were reviewed with the pt.  Therapeutic Exercise:  Pt. worked on right shoulder active ROM, and reaching. Pt. worked on moving objects through [employer] over 3 vertical rungs with less compensating, and leaning with the trunk this date. Pt. Worked on reaching tasks to be able to reach for clothes in the closet, perform morning ADLs, and iron.  Selfcare:   Pt. worked on toileting skills with CGA in standing with the grab bar on his left side. Pt. required CGA hiking his pants in standing. Independent with set-up for hand hygiene.  OT Education - 05/25/18 1307    Education provided  Yes    Education Details  RUE Functioning    Person(s) Educated  Patient    Methods  Explanation;Demonstration    Comprehension  Verbalized understanding;Returned demonstration;Verbal cues required       OT Short Term Goals - 03/03/17 1459      OT SHORT TERM GOAL #1   Title  `        OT Long Term Goals - 05/25/18 1344      OT LONG TERM GOAL #4   Title  Pt. will perform self dressing with minA and A/E as needed.    Baseline  Pt. continues to improve LB dressing in standing. Pt.  continues to require increased time and mod A donn socks, and pants over his feet. CGA standing to hike clothing. Pt. requires min A donning a jacket, and Independent doning a shirt.    Time  12    Period  Weeks    Status  On-going    Target Date  07/16/18      OT LONG TERM GOAL #6   Title  Pt. will write his name efficiently with 100% legibility    Baseline  04/23/2018: Pt continues to present with limited legibility and efficiency when writing name to sign in when riding the [place]    Time  12    Period  Weeks     Status  On-going    Target Date  07/16/18      OT LONG TERM GOAL #7   Title  Pt will independently and consistently follow HEP to increase UE strength to increase functional independence    Baseline  Pt to continues to work towards independence with exercises    Time  12    Period  Weeks    Status  On-going    Target Date  07/16/18      OT LONG TERM GOAL #8   Title  Pt. will require supervision ironing a shirt.    Baseline  Pt. requires minA, and complete set-up seated using a tabletop iron    Time  12    Period  Weeks    Status  New    Target Date  07/16/18      OT LONG TERM GOAL  #9   Baseline  Pt. will independently be able to sew a button onto a shirt.    Time  12    Period  Weeks    Status  On-going    Target Date  07/16/18      OT LONG TERM GOAL  #10   TITLE  Pt. will independently be able to throw a ball    Baseline  04/23/2018: Pt. is unable.    Time  12    Period  Weeks    Status  On-going      OT LONG TERM GOAL  #11   TITLE  Pt. will improve UE functional reaching to be able to independently use his RUE to hand his clothes in the closest.    Baseline   Pt. continues to have difficulty hanging his clothes up in his closet.    Time  12    Period  Weeks    Status  On-going    Target Date  07/16/18            Plan - 05/25/18 1308    Clinical Impression Statement  Pt. has made excellent progress with RUE shoulder ROM in  preparation for functional reaching.  Pt. continues to present with limited RUE weakness, limited UE strength, decreased grip strength bilaterally, and limited right [facility] skills which limit his ability to perform functional reaching, and ADL, and IADL tasks. Pt. continues to work on improving ADL, and IADL functioning. ironing, and sewing. Goals were reviewed with the pt..    Occupational performance deficits (Please refer to evaluation for details):  ADL's;IADL's    Rehab Potential  Fair    OT Frequency  2x / week    OT Duration  12 weeks     OT Treatment/Interventions  Self-care/ADL training;Patient/family education;DME and/or AE instruction;Therapeutic exercise;Moist Heat;Neuromuscular education    Clinical Decision Making  Several treatment options, min-mod task modification necessary    Consulted and Agree with Plan of Care  Patient       Patient will benefit from skilled therapeutic intervention in order to improve the following deficits and impairments:  Pain, Decreased cognition, Decreased activity tolerance, Impaired UE functional use, Decreased range of motion, Decreased coordination, Decreased endurance  Visit Diagnosis: Muscle weakness (generalized)  Other lack of coordination    Problem List Patient Active Problem List   Diagnosis Date Noted  . Acute CVA (cerebrovascular accident) ([place]) 05/02/2016  . Left-sided weakness 05/01/2016    Harrel Carina, MS, OTR/L 05/25/2018, 6:17 PM  [place] MAIN [facility] SERVICES [address] [place], [place], [address] Phone: [phone]   Fax:  [phone]  Name: Curtis Powell MRN: [id number] Date of Birth: 1965/08/16

## 2018-05-27 ENCOUNTER — Ambulatory Visit: Payer: Medicare HMO | Admitting: Occupational Therapy

## 2018-05-27 ENCOUNTER — Encounter: Payer: Self-pay | Admitting: Physical Therapy

## 2018-05-27 ENCOUNTER — Ambulatory Visit: Payer: Medicare HMO | Admitting: Physical Therapy

## 2018-05-27 DIAGNOSIS — I69351 Hemiplegia and hemiparesis following cerebral infarction affecting right dominant side: Secondary | ICD-10-CM

## 2018-05-27 DIAGNOSIS — M6281 Muscle weakness (generalized): Secondary | ICD-10-CM | POA: Diagnosis not present

## 2018-05-27 DIAGNOSIS — R278 Other lack of coordination: Secondary | ICD-10-CM

## 2018-05-27 DIAGNOSIS — R2689 Other abnormalities of gait and mobility: Secondary | ICD-10-CM

## 2018-05-27 NOTE — Therapy (Signed)
[place] MAIN [facility] SERVICES [address] [place], [place], [address] Phone: [phone]   Fax:  [phone]  Physical Therapy Treatment  Patient Details  Name: Curtis Powell MRN: [id number] Date of Birth: [birth year] No data recorded  Encounter Date: 05/27/2018  PT End of Session - 05/27/18 1421    Visit Number  104    Number of Visits  115    Date for PT Re-Evaluation  06/09/18    Authorization Type  last goals: 03/27/19    PT Start Time  0145    PT Stop Time  0230    PT Time Calculation (min)  45 min    Equipment Utilized During Treatment  Gait belt    Activity Tolerance  Patient tolerated treatment well;No increased pain;Patient limited by fatigue    Behavior During Therapy  [facility] for tasks assessed/performed       Past Medical History:  Diagnosis Date  . GERD (gastroesophageal reflux disease)   . Hyperlipidemia   . Hypertension   . Obesity   . Stroke [facility])     Past Surgical History:  Procedure Laterality Date  . BRAIN SURGERY      There were no vitals filed for this visit.  Subjective Assessment - 05/27/18 1357    Subjective  The patient reports that he is doing well today. He notes that his legs are a little sore today and have been since yesterday. He also notes that he is having some R knee pain, but that ice and walking have helped to relieve some of his pain. He stretches at home consistently with the help of his caregiver.    Pertinent History   Patient is a pleasant 53 year old male who presents to physical therapy for weakness and immobility secondary to CVA.  Had a stroke in Feb 2018. He was previously fully independent, but this stroke caused severe residual deficits, mainly on the right side as well as speech, and now he is unable to walk or perform most of his ADLs on his own. Entire right side is very weak. He still has a little difficulty with speech but his swallowing is improved to baseline. His mom had to move in  with him and now is his main caregiver.     Limitations  Sitting;Lifting;Standing;Walking;House hold activities    How long can you sit comfortably?  5 minutes    How long can you stand comfortably?  5 minutes    How long can you walk comfortably?  10 ft    Patient Stated Goals  Walk without walker, walk further with walker, get some strength in back     Currently in Pain?  Yes    Pain Score  2     Pain Location  Knee    Pain Orientation  Right    Pain Descriptors / Indicators  Aching    Pain Type  Acute pain    Pain Onset  Yesterday    Multiple Pain Sites  No          Treatment  Gait training with RW 160 feet x 2 and CGA , wc following on level surface with vc for posture. Patient has decreased knee flex and has increased lateral sway during swing phase of gait bilaterally. Transfer sit to stand from wc level with one UE on wc arm and the other on RW with CGA x 5 reps VC for correct sequencing  Transfer sit to stand from mat  level with one UE on mat and the other on RW with CGA  VC for correct sequencing   Therapeutic exercise LAQ with 3 lbs x 10 x 2 with 3 sec hold Marching BLE x 10 x 2  Knee flex with RTB x 20 x 2 sets BLE  Hip abd/Er with GTB x 20 x 2  CGA and Min to mod verbal cues used throughout with increasedlateralsway and during gait due to inability to flex knees. Patient has better step length bilaterally during gait. Pt educated throughout session about proper posture and technique with exercises. Improved exercise technique, movement at target joints, use of target muscles after min to mod verbal, visual, tactile cues.                       PT Education - 05/27/18 1421    Education provided  Yes    Education Details  HEP    Person(s) Educated  Patient    Methods  Explanation;Demonstration    Comprehension  Verbalized understanding;Returned demonstration;Need further instruction       PT Short Term Goals - 03/26/18 1553      PT  SHORT TERM GOAL #1   Title  Patient will be independent in home exercise program to improve strength/mobility for better functional independence with ADLs.    Baseline  hep compliant    Time  2    Period  Weeks    Status  Achieved      PT SHORT TERM GOAL #2   Title  Patient will require min cueing for STS transfer with CGA for increased independence with mobility.     Baseline  CGA    Time  2    Period  Weeks    Status  Achieved      PT SHORT TERM GOAL #3   Title  Patient will maintain upright posture for > 15 seconds to demonstrate strengthened postural control muscualture     Baseline  18 seconds     Time  2    Period  Weeks    Status  Achieved        PT Long Term Goals - 03/26/18 1553      PT LONG TERM GOAL #1   Title  Patient will perform bed mobility with Supervision to increase independent mobility    Baseline  able to perform mod I for increased time , occasional Min A for scooting up in bed; 7/16: min guard for safety with increased effort and time, cues to scoot back in bed before sit>supine 9/12: can do it independently but with difficulty    Time  8    Period  Weeks    Status  Achieved      PT LONG TERM GOAL #2   Title  Patient (< 18 years old) will complete five times sit to stand test in < 10 seconds indicating an increased LE strength and improved balance.    Baseline  2/18: 30 seconds from plinth chair height single UE on seat and single on walker ; 32 seconds from plinth with single UE on seat and single on walker. 4/10: 26 seconds from [facility] with one hand on walker5/29: 22.2 seconds with one hand on walker one hand on WC; 7/16: one hand on walker one hand on WC 18.96 seconds; pt achieving 75% of full upright standing 8/22:one hand on walker one hand on WC 18 seconds; pt achieving 75% of full upright standing  9/12: 18  seconds econds; pt achieving 75% of full upright standing 8/22:one hand on walker one hand on WC 10/15: 17seconds; pt achieving 75% of full upright  posture 11/7: 16 seconds; pt achieving 75 upright posture 12/19: 15 seconds 80% upright posture    Time  8    Period  Weeks    Status  Partially Met    Target Date  04/09/18      PT LONG TERM GOAL #3   Title  Patient will ambulate 40 ft with walker with no breaks to allow for increased mobility within home.     Baseline  12/26: 29 ft with walker and WC follow with no rest breaks; 2/18: multiple trials previous session of >100 ft     Time  8    Period  Weeks    Status  Achieved      PT LONG TERM GOAL #4   Title  Patient will increase BLE gross strength to 4+/5 as to improve functional strength for independent gait, increased standing tolerance and increased ADL ability.    Baseline   4-/5 RLE, 10/15: 4/5 RLE 11/7: 4/5 RLE    Time  8    Period  Weeks    Status  Partially Met    Target Date  04/09/18      PT LONG TERM GOAL #5   Title  Patient will increase lower extremity functional scale to >40/80 to demonstrate improved functional mobility and increased tolerance with ADLs.     Baseline  14/80; 12/26: 21/80; 2/18: 28/80  4/10; 25/80 5/29: 27/80; 7/16: 29/80 8/22: 30/80  9/12: 26/80 (feeling more tired due to moving) 10/15: 37/80 11/7: 23/80    Time  8    Period  Weeks    Status  Partially Met    Target Date  04/09/18      PT LONG TERM GOAL #6   Title  Patient will walk through a door involving one small step up/down to increase functional independence.     Baseline  require CGA  BUE support in // bars at this time 10/15: required BUE and CGA in //bars    Time  8    Period  Weeks    Status  Deferred      PT LONG TERM GOAL #7   Title  Patient will ambulate 40 ft with least assistive device and Supervision with no breaks to allow for increased mobility within home.     Baseline  requires use of walker and CGA     Time  8    Period  Weeks    Status  Achieved      PT LONG TERM GOAL #8   Title  Patient will perform 10 MWT in < 1 minute to demonstrate improved ambulatory velocity  and capacity.     Baseline  4/10: 2 minutes 45 seconds; 5/29: 2 minute 31 seconds; 3 minutes 22 seconds 8/22: 3 minutes 2 seconds  9/12:  2 minutes and 43 seconds 10/15: 22mnute 12 seconds 11/7: 1 minute 21 seconds 12/19: 1 min 16 seconds     Time  8    Period  Weeks    Status  Partially Met    Target Date  04/09/18      PT LONG TERM GOAL  #9   TITLE  Patient will increase Berg Balance score by > 6 points (26/56) to demonstrate decreased fall risk during functional activities.    Baseline  9/12: 20/56 10/15: 21/56 11/7: 20/56 12/19:  22/56     Time  8    Period  Weeks    Status  Partially Met    Target Date  04/09/18      PT LONG TERM GOAL  #10   TITLE  Patient (< 80 years old) will complete five times sit to stand test with full upright posture in < 30 seconds indicating an increased glute activation, LE strength and improved balance.    Baseline  10/15: 37seconds 11/7: 20 seconds     Time  8    Period  Weeks    Status  Achieved      PT LONG TERM GOAL  #11   TITLE  Patient will ambulate 100 ft with least assistive device and Supervision with no breaks to allow for increased mobility within home.     Baseline  10/17: 54f 11/7: 1080f   Time  8    Period  Weeks    Status  Achieved      PT LONG TERM GOAL  #12   TITLE  Patient (< 604ears old) will complete five times sit to stand test with full upright posture in < 15 seconds indicating increased glute activation, LE strength and improved balance.    Baseline  11/7: 20 seconds 12/19: 18 seconds     Time  8    Period  Weeks    Status  Partially Met    Target Date  04/09/18      PT LONG TERM GOAL  #13   TITLE  Patient will ambulate 150 ft with least assistive device and Supervision with no breaks to allow for increased mobility within home.     Baseline  11/7: 10033f2/19: 115 ft     Time  8    Period  Weeks    Status  Partially Met    Target Date  04/09/18            Plan - 05/27/18 1422    Clinical Impression  Statement  Pt given minimal cues for stratgeies to obtain foot clearance during swing phase, but he is able to maintain consisten tmechanics throughout. Pt is appropriate for youth height RW however, none available this session. Pt also participating in lateral and retro AMB in //bars at bend of session then assisted to [facility] toiletting and self care. Pt demonstrate great progress with AMB capacity, remains slow, but in general more consistent motor patterns prior to degradation from fatigue. As patient is now tolerating >20 minutes of standing activity in session, it may be prudent to further address postural limitations preventing upright stance, as improved trunk/hip extension ROM would improve energy conservation in standing/AMB    Rehab Potential  Fair    Clinical Impairments Affecting Rehab Potential  hx of HTN, HLD, CVA, learning disability, Diabetes, brain tumor,     PT Frequency  2x / week    PT Duration  8 weeks    PT Treatment/Interventions  ADLs/Self Care Home Management;Aquatic Therapy;Ultrasound;Moist Heat;Traction;DME Instruction;Gait training;Stair training;Functional mobility training;Therapeutic activities;Therapeutic exercise;Orthotic Fit/Training;Neuromuscular re-education;Balance training;Patient/family education;Manual techniques;Wheelchair mobility training;Passive range of motion;Energy conservation;Taping;Visual/perceptual remediation/compensation    PT Next Visit Plan  Update outcome measures/goals, balance and stability and ambulation, anterior weight shift hold    PT Home Exercise Plan  TrA contraction, glute sets: needs updates. Potentially some time in supine flat in hospital bed, to improve hip extension deficits.     Consulted and Agree with Plan of Care  Patient  Patient will benefit from skilled therapeutic intervention in order to improve the following deficits and impairments:  Abnormal gait, Decreased activity tolerance, Decreased balance, Decreased knowledge of  precautions, Decreased endurance, Decreased coordination, Decreased knowledge of use of DME, Decreased mobility, Decreased range of motion, Difficulty walking, Decreased safety awareness, Decreased strength, Impaired flexibility, Impaired perceived functional ability, Impaired tone, Postural dysfunction, Improper body mechanics, Pain  Visit Diagnosis: Muscle weakness (generalized)  Other lack of coordination  Other abnormalities of gait and mobility  Hemiplegia and hemiparesis following cerebral infarction affecting right dominant side [facility])     Problem List Patient Active Problem List   Diagnosis Date Noted  . Acute CVA (cerebrovascular accident) ([place]) 05/02/2016  . Left-sided weakness 05/01/2016    Alanson Puls, [place] DPT 05/27/2018, 3:24 PM  [place] [facility] MAIN [facility] SERVICES [address] [place], [place], [address] Phone: [phone]   Fax:  [phone]  Name: Curtis Powell MRN: [id number] Date of Birth: December 17, 1965

## 2018-06-01 ENCOUNTER — Ambulatory Visit: Payer: Medicare HMO | Admitting: Occupational Therapy

## 2018-06-01 ENCOUNTER — Ambulatory Visit: Payer: Medicare HMO

## 2018-06-03 ENCOUNTER — Ambulatory Visit: Payer: Medicare HMO

## 2018-06-03 ENCOUNTER — Ambulatory Visit: Payer: Medicare HMO | Admitting: Occupational Therapy

## 2018-06-09 ENCOUNTER — Encounter: Payer: Self-pay | Admitting: Occupational Therapy

## 2018-06-09 ENCOUNTER — Ambulatory Visit: Payer: Medicare HMO | Attending: Family Medicine | Admitting: Occupational Therapy

## 2018-06-09 ENCOUNTER — Ambulatory Visit: Payer: Medicare HMO

## 2018-06-09 DIAGNOSIS — M6281 Muscle weakness (generalized): Secondary | ICD-10-CM | POA: Diagnosis present

## 2018-06-09 DIAGNOSIS — R278 Other lack of coordination: Secondary | ICD-10-CM

## 2018-06-09 DIAGNOSIS — I69351 Hemiplegia and hemiparesis following cerebral infarction affecting right dominant side: Secondary | ICD-10-CM | POA: Diagnosis present

## 2018-06-09 DIAGNOSIS — R2689 Other abnormalities of gait and mobility: Secondary | ICD-10-CM

## 2018-06-09 NOTE — Therapy (Signed)
[place] MAIN [facility] SERVICES [address] [place], [place], [address] Phone: [phone]   Fax:  [phone]  Physical Therapy Treatment/RECERT  Patient Details  Name: Curtis Powell MRN: [id number] Date of Birth: 01-08-1966 No data recorded  Encounter Date: 06/09/2018  PT End of Session - 06/09/18 1414    Visit Number  [id number]    Number of Visits  121    Date for PT Re-Evaluation  08/04/18    Authorization Type  last goals: 06/09/18    Authorization Time Period  5/10     PT Start Time  1431    PT Stop Time  1515    PT Time Calculation (min)  44 min    Equipment Utilized During Treatment  Gait belt    Activity Tolerance  Patient tolerated treatment well;No increased pain;Patient limited by fatigue    Behavior During Therapy  [facility] for tasks assessed/performed       Past Medical History:  Diagnosis Date  . GERD (gastroesophageal reflux disease)   . Hyperlipidemia   . Hypertension   . Obesity   . Stroke [facility])     Past Surgical History:  Procedure Laterality Date  . BRAIN SURGERY      There were no vitals filed for this visit.  Subjective Assessment - 06/09/18 1512    Subjective  Patient reports he has been walking daily with his caregiver short distances. Feels he is able to be more independent with his ability to perform toileting in standing now. I    Pertinent History   Patient is a pleasant 53 year old male who presents to physical therapy for weakness and immobility secondary to CVA.  Had a stroke in Feb 2018. He was previously fully independent, but this stroke caused severe residual deficits, mainly on the right side as well as speech, and now he is unable to walk or perform most of his ADLs on his own. Entire right side is very weak. He still has a little difficulty with speech but his swallowing is improved to baseline. His mom had to move in with him and now is his main caregiver.     Limitations   Sitting;Lifting;Standing;Walking;House hold activities    How long can you sit comfortably?  5 minutes    How long can you stand comfortably?  5 minutes    How long can you walk comfortably?  10 ft    Patient Stated Goals  Walk without walker, walk further with walker, get some strength in back     Currently in Pain?  Yes    Pain Score  1     Pain Location  Knee    Pain Orientation  Right    Pain Descriptors / Indicators  Aching    Pain Type  Acute pain    Pain Onset  Yesterday    Pain Frequency  Intermittent      Patient has missed previous week due to transportation issues.    [facility] PT Assessment - 06/09/18 0001      Berg Balance Test   Sit to Stand  Able to stand  independently using hands    Standing Unsupported  Able to stand 2 minutes with supervision    Sitting with Back Unsupported but Feet Supported on Floor or Stool  Able to sit safely and securely 2 minutes    Stand to Sit  Controls descent by using hands    Transfers  Able to transfer with verbal cueing  and /or supervision    Standing Unsupported with Eyes Closed  Able to stand 10 seconds with supervision    Standing Unsupported with Feet Together  Needs help to attain position but able to stand for 30 seconds with feet together    From Standing, Reach Forward with Outstretched Arm  Reaches forward but needs supervision    From Standing Position, Pick up Object from Floor  Unable to pick up and needs supervision    From Standing Position, Turn to Look Behind Over each Shoulder  Needs supervision when turning    Turn 360 Degrees  Needs assistance while turning    Standing Unsupported, Alternately Place Feet on Step/Stool  Able to complete >2 steps/needs minimal assist    Standing Unsupported, One Foot in [employer] balance while stepping or standing    Standing on One Leg  Unable to try or needs assist to prevent fall    Total Score  23      Patient (< 63 years old) will complete five times sit to stand test in < 10  seconds indicating an increased LE strength and improved balance  Patient will increase BLE gross strength to 4+/5 as to improve functional strength for independent gait, increased standing tolerance and increased ADL ability.: 4/5    Patient will increase lower extremity functional scale to >40/80 to demonstrate improved functional mobility and increased tolerance with ADLs.  32/80  Patient will perform 10 MWT in < 1 minute to demonstrate improved ambulatory velocity and capacity.  66 seconds first trial, 57 seconds second trial  Patient will increase Berg Balance score by > 6 points (26/56) to demonstrate decreased fall risk during functional activities.: 23/56   Patient (< 2 years old) will complete five times sit to stand test with full upright posture in < 15 seconds indicating increased glute activation, LE strength and improved balance.: 44 seconds to full upright posture slowly, when repeated able to perform in 18 seconds.    Patient reports he is able to go to the bathroom easier, able to stand with more independence.    Patient would like to turn over in the bed.    Ambulated 115 ft x 2 trials on 05/14/18.   Treat: 6 inch step seated coordinated toe taps with increased speed required 3x 30 seconds. More challenging with RLE.    Standing marches with RW and CGA 10x each LE. Verbal and min A for weight shift for optimal LE clearance.   Seated: adduction 10x 5 second squeezes  Seated: plantarflexion dorsiflexion rocking 20x for improving foot clearance.                      PT Education - 06/09/18 1413    Education provided  Yes    Education Details  goals, POC     Person(s) Educated  Patient    Methods  Explanation;Demonstration;Tactile cues;Verbal cues    Comprehension  Verbalized understanding;Returned demonstration;Verbal cues required;Tactile cues required;Need further instruction       PT Short Term Goals - 06/09/18 1415      PT SHORT TERM GOAL #1    Title  Patient will be independent in home exercise program to improve strength/mobility for better functional independence with ADLs.    Baseline  hep compliant    Time  2    Period  Weeks    Status  Achieved      PT SHORT TERM GOAL #2   Title  Patient  will require min cueing for STS transfer with CGA for increased independence with mobility.     Baseline  CGA    Time  2    Period  Weeks    Status  Achieved      PT SHORT TERM GOAL #3   Title  Patient will maintain upright posture for > 15 seconds to demonstrate strengthened postural control muscualture     Baseline  18 seconds     Time  2    Period  Weeks    Status  Achieved        PT Long Term Goals - 06/09/18 1522      PT LONG TERM GOAL #1   Title  Patient will perform bed mobility with Supervision to increase independent mobility    Baseline  able to perform mod I for increased time , occasional Min A for scooting up in bed; 7/16: min guard for safety with increased effort and time, cues to scoot back in bed before sit>supine 9/12: can do it independently but with difficulty    Time  8    Period  Weeks    Status  Achieved      PT LONG TERM GOAL #2   Title  Patient (< 50 years old) will complete five times sit to stand test in < 10 seconds indicating an increased LE strength and improved balance.    Baseline  2/18: 30 seconds from plinth chair height single UE on seat and single on walker ; 32 seconds from plinth with single UE on seat and single on walker. 4/10: 26 seconds from [facility] with one hand on walker5/29: 22.2 seconds with one hand on walker one hand on WC; 7/16: one hand on walker one hand on WC 18.96 seconds; pt achieving 75% of full upright standing 8/22:one hand on walker one hand on WC 18 seconds; pt achieving 75% of full upright standing  9/12: 18 seconds econds; pt achieving 75% of full upright standing 8/22:one hand on walker one hand on WC 10/15: 17seconds; pt achieving 75% of full upright posture 11/7: 16  seconds; pt achieving 75 upright posture 12/19: 15 seconds 80% upright posture 3/3: 18 seconds 80% upright posture     Time  8    Period  Weeks    Status  Partially Met    Target Date  08/04/18      PT LONG TERM GOAL #3   Title  Patient will ambulate 40 ft with walker with no breaks to allow for increased mobility within home.     Baseline  12/26: 29 ft with walker and WC follow with no rest breaks; 2/18: multiple trials previous session of >100 ft     Time  8    Period  Weeks    Status  Achieved      PT LONG TERM GOAL #4   Title  Patient will increase BLE gross strength to 4+/5 as to improve functional strength for independent gait, increased standing tolerance and increased ADL ability.    Baseline   4-/5 RLE, 10/15: 4/5 RLE 11/7: 4/5 RLE 3/3: 4/5     Time  8    Period  Weeks    Status  Partially Met    Target Date  08/04/18      PT LONG TERM GOAL #5   Title  Patient will increase lower extremity functional scale to >40/80 to demonstrate improved functional mobility and increased tolerance with ADLs.     Baseline  14/80; 12/26: 21/80; 2/18: 28/80  4/10; 25/80 5/29: 27/80; 7/16: 29/80 8/22: 30/80  9/12: 26/80 (feeling more tired due to moving) 10/15: 37/80 11/7: 23/80 3/3: 32/80    Time  8    Period  Weeks    Status  Partially Met    Target Date  08/04/18      Additional Long Term Goals   Additional Long Term Goals  Yes      PT LONG TERM GOAL #6   Title  Patient will walk through a door involving one small step up/down to increase functional independence.     Baseline  require CGA  BUE support in // bars at this time 10/15: required BUE and CGA in //bars    Time  8    Period  Weeks    Status  Deferred      PT LONG TERM GOAL #7   Title  Patient will ambulate 40 ft with least assistive device and Supervision with no breaks to allow for increased mobility within home.     Baseline  requires use of walker and CGA     Time  8    Period  Weeks    Status  Achieved      PT LONG  TERM GOAL #8   Title  Patient will perform 10 MWT in < 1 minute to demonstrate improved ambulatory velocity and capacity.     Baseline  4/10: 2 minutes 45 seconds; 5/29: 2 minute 31 seconds; 3 minutes 22 seconds 8/22: 3 minutes 2 seconds  9/12:  2 minutes and 43 seconds 10/15: 41mnute 12 seconds 11/7: 1 minute 21 seconds 12/19: 1 min 16 seconds 3/3: 57 seconds    Time  8    Period  Weeks    Status  Achieved      PT LONG TERM GOAL  #9   TITLE  Patient will increase Berg Balance score by > 6 points (26/56) to demonstrate decreased fall risk during functional activities.    Baseline  9/12: 20/56 10/15: 21/56 11/7: 20/56 12/19: 22/56     Time  8    Period  Weeks    Status  Partially Met      PT LONG TERM GOAL  #10   TITLE  Patient (< 676years old) will complete five times sit to stand test with full upright posture in < 30 seconds indicating an increased glute activation, LE strength and improved balance.    Baseline  10/15: 37seconds 11/7: 20 seconds     Time  8    Period  Weeks    Status  Achieved      PT LONG TERM GOAL  #11   TITLE  Patient will ambulate 100 ft with least assistive device and Supervision with no breaks to allow for increased mobility within home.     Baseline  10/17: 867f11/7: 10030f  Time  8    Period  Weeks    Status  Achieved      PT LONG TERM GOAL  #12   TITLE  Patient (< 60 5ars old) will complete five times sit to stand test with full upright posture in < 15 seconds indicating increased glute activation, LE strength and improved balance.    Baseline  11/7: 20 seconds 12/19: 18 seconds 3/3: 18 seconds    Time  8    Period  Weeks    Status  Partially Met    Target Date  08/04/18  PT LONG TERM GOAL  #13   TITLE  Patient will ambulate 150 ft with least assistive device and Supervision with no breaks to allow for increased mobility within home.     Baseline  11/7: 129f 12/19: 115 ft  3/3: will assess next session, has done 200 ft with one rest break     Time  8    Period  Weeks    Status  Partially Met    Target Date  08/04/18      PT LONG TERM GOAL  #14   TITLE  Patient will increase 10 meter walk test to <30 seconds as to improve gait speed for better community ambulation and to reduce fall risk.    Baseline  3/3: 57 seconds with RW and CGA    Time  8    Period  Weeks    Status  New    Target Date  08/04/18            Plan - 06/09/18 1530    Clinical Impression Statement  Patient presents to therapy with good motivation. Goals were re-assessed with patient demonstrating significant progress with ambulatory biomechanics and velocity with 10 MWT improving from 1 minute 16 seconds to 57 seconds with RW today meeting his long term goal. for toiletting and self care. Pt demonstrate great progress with AMB capacity, remains slow, but in general more consistent motor patterns prior to degradation from fatigue. Patient is now tolerating >20 minutes of standing activity in session. Patient continues to be challenged with stability without UE support as well as excessive hip/trunk flexion resulting in limited ability to obtain neutral upright posture. Patient's condition has the potential to improve in response to therapy. Maximum improvement is yet to be obtained. The anticipated improvement is attainable and reasonable in a generally predictable time.  The patient will continue to benefit from skilled PT in order to work towards goals, to increase strength, and to maximize safety and independence with functional mobility    Rehab Potential  Fair    Clinical Impairments Affecting Rehab Potential  hx of HTN, HLD, CVA, learning disability, Diabetes, brain tumor,     PT Frequency  2x / week    PT Duration  8 weeks    PT Treatment/Interventions  ADLs/Self Care Home Management;Aquatic Therapy;Ultrasound;Moist Heat;Traction;DME Instruction;Gait training;Stair training;Functional mobility training;Therapeutic activities;Therapeutic exercise;Orthotic  Fit/Training;Neuromuscular re-education;Balance training;Patient/family education;Manual techniques;Wheelchair mobility training;Passive range of motion;Energy conservation;Taping;Visual/perceptual remediation/compensation    PT Next Visit Plan  test distance ability to ambulate    PT Home Exercise Plan  TrA contraction, glute sets: needs updates. Potentially some time in supine flat in hospital bed, to improve hip extension deficits.     Consulted and Agree with Plan of Care  Patient       Patient will benefit from skilled therapeutic intervention in order to improve the following deficits and impairments:  Abnormal gait, Decreased activity tolerance, Decreased balance, Decreased knowledge of precautions, Decreased endurance, Decreased coordination, Decreased knowledge of use of DME, Decreased mobility, Decreased range of motion, Difficulty walking, Decreased safety awareness, Decreased strength, Impaired flexibility, Impaired perceived functional ability, Impaired tone, Postural dysfunction, Improper body mechanics, Pain  Visit Diagnosis: Muscle weakness (generalized)  Other lack of coordination  Other abnormalities of gait and mobility     Problem List Patient Active Problem List   Diagnosis Date Noted  . Acute CV[place] (cerebrovascular accident) [place] 05/02/2016  . Left-sided weakness 05/01/2016    MJanna Arch PT, DPT   06/09/2018, 3:31 PM  [place] MAIN [facility] SERVICES [address] [place], [place], [address] Phone: [phone]   Fax:  [phone]  Name: Curtis Powell MRN: [id number] Date of Birth: Aug 11, 1965

## 2018-06-09 NOTE — Therapy (Signed)
[place] MAIN [facility] SERVICES [address] [place], [place], [address] Phone: [phone]   Fax:  [phone]  Occupational Therapy Treatment  Patient Details  Name: Curtis Powell MRN: [id number] Date of Birth: 23-Oct-1965 No data recorded  Encounter Date: 06/09/2018  OT End of Session - 06/09/18 1408    Visit Number  91    Number of Visits  113    Date for OT Re-Evaluation  07/16/18    Authorization Type  Progress reporting period starting 04/23/2018    OT Start Time  1345    OT Stop Time  1430    OT Time Calculation (min)  45 min    Equipment Utilized During Treatment  Long handled shoe horn    Activity Tolerance  Patient tolerated treatment well    Behavior During Therapy  WFL for tasks assessed/performed       Past Medical History:  Diagnosis Date  . GERD (gastroesophageal reflux disease)   . Hyperlipidemia   . Hypertension   . Obesity   . Stroke [facility])     Past Surgical History:  Procedure Laterality Date  . BRAIN SURGERY      There were no vitals filed for this visit.  Subjective Assessment - 06/09/18 1407    Subjective   Pt. was very late    Pertinent History  Pt. is a 53 y.o. male who suffered a CVA on 05/01/2016. Pt. was admitted to the hospital. Once discharged, he received Home Health PT and OT services for about a month. Pt. has had multiple CVAs over the past 8 years, and has had multiple falls in the past 6 months. Pt. resides in an apartment. Pt. has caregivers for 80 hours. Pt.'s mother stays with pt. at night, and assists with IADL tasks.      Patient Stated Goals  To be able to throw a ball and dribble a ball, do as much as I can for myself.     Currently in Pain?  Yes       OT TREATMENT    Therapeutic Exercise:  Pt. worked on right shoulder active ROM, and reaching. Pt. worked on moving objects through [employer] over 3 vertical rungs with less compensating, and leaning with the trunk this date. Pt.  Worked on reaching tasks to be able to reach for clothes in the closet, perform morning ADLs, and iron.  Selfcare:  Pt. worked on sewing skills formulating a straight stitch with a medium sized sewing needle using medium sized thread of a constrasting color initially, followed by small thread of similar color as the fabric. Pt. required less cues today for needle placement on the fabric. Pt. requires reading glasses, and increased time. Pt. continues to work on formulating a straight stitch in order to be able to hem clothes, and sew a button onto a shirt.                         OT Education - 06/09/18 1408    Education provided  Yes    Education Details  RUE Functioning    Person(s) Educated  Patient    Methods  Explanation;Demonstration    Comprehension  Verbalized understanding;Returned demonstration;Verbal cues required       OT Short Term Goals - 03/03/17 1459      OT SHORT TERM GOAL #1   Title  `        OT Long Term Goals -  05/25/18 1344      OT LONG TERM GOAL #4   Title  Pt. will perform self dressing with minA and A/E as needed.    Baseline  Pt. continues to improve LB dressing in standing. Pt.  continues to require increased time and mod A donn socks, and pants over his feet. CGA standing to hike clothing. Pt. requires min A donning a jacket, and Independent doning a shirt.    Time  12    Period  Weeks    Status  On-going    Target Date  07/16/18      OT LONG TERM GOAL #6   Title  Pt. will write his name efficiently with 100% legibility    Baseline  04/23/2018: Pt continues to present with limited legibility and efficiency when writing name to sign in when riding the [place]    Time  12    Period  Weeks    Status  On-going    Target Date  07/16/18      OT LONG TERM GOAL #7   Title  Pt will independently and consistently follow HEP to increase UE strength to increase functional independence    Baseline  Pt to continues to work towards  independence with exercises    Time  12    Period  Weeks    Status  On-going    Target Date  07/16/18      OT LONG TERM GOAL #8   Title  Pt. will require supervision ironing a shirt.    Baseline  Pt. requires minA, and complete set-up seated using a tabletop iron    Time  12    Period  Weeks    Status  New    Target Date  07/16/18      OT LONG TERM GOAL  #9   Baseline  Pt. will independently be able to sew a button onto a shirt.    Time  12    Period  Weeks    Status  On-going    Target Date  07/16/18      OT LONG TERM GOAL  #10   TITLE  Pt. will independently be able to throw a ball    Baseline  04/23/2018: Pt. is unable.    Time  12    Period  Weeks    Status  On-going      OT LONG TERM GOAL  #11   TITLE  Pt. will improve UE functional reaching to be able to independently use his RUE to hand his clothes in the closest.    Baseline   Pt. continues to have difficulty hanging his clothes up in his closet.    Time  12    Period  Weeks    Status  On-going    Target Date  07/16/18            Plan - 06/09/18 1409    Clinical Impression Statement  Pt. continues to present with limited UE functional reaching, right hand strength, and [facility] skills which limits his ablity to complete ADL, and IADL tasks independently. Pt. continues to work on improving BUE functioning in order to be able to maximize independence with ADLs, and IADLs including: self dressing, writing legibly, sewing a button, and ironing clothing.    Occupational performance deficits (Please refer to evaluation for details):  ADL's;IADL's    Rehab Potential  Fair    Clinical Decision Making  Several treatment options, min-mod task modification necessary  OT Frequency  2x / week    OT Duration  12 weeks    OT Treatment/Interventions  Self-care/ADL training;Patient/family education;DME and/or AE instruction;Therapeutic exercise;Moist Heat;Neuromuscular education    Consulted and Agree with Plan of Care  Patient        Patient will benefit from skilled therapeutic intervention in order to improve the following deficits and impairments:     Visit Diagnosis: Muscle weakness (generalized)  Other lack of coordination    Problem List Patient Active Problem List   Diagnosis Date Noted  . Acute CVA (cerebrovascular accident) ([place]) 05/02/2016  . Left-sided weakness 05/01/2016    Harrel Carina, MS, OTR/L 06/09/2018, 2:22 PM  [place] MAIN [facility] SERVICES [address] [place], [place], [address] Phone: [phone]   Fax:  [phone]  Name: Curtis Powell MRN: [id number] Date of Birth: 19-Nov-1965

## 2018-06-11 ENCOUNTER — Encounter: Payer: Self-pay | Admitting: Occupational Therapy

## 2018-06-11 ENCOUNTER — Ambulatory Visit: Payer: Medicare HMO

## 2018-06-11 ENCOUNTER — Ambulatory Visit: Payer: Medicare HMO | Admitting: Occupational Therapy

## 2018-06-11 DIAGNOSIS — M6281 Muscle weakness (generalized): Secondary | ICD-10-CM | POA: Diagnosis not present

## 2018-06-11 DIAGNOSIS — R278 Other lack of coordination: Secondary | ICD-10-CM

## 2018-06-11 DIAGNOSIS — I69351 Hemiplegia and hemiparesis following cerebral infarction affecting right dominant side: Secondary | ICD-10-CM

## 2018-06-11 DIAGNOSIS — R2689 Other abnormalities of gait and mobility: Secondary | ICD-10-CM

## 2018-06-11 NOTE — Therapy (Addendum)
[place] MAIN [facility] SERVICES [address] [place], [place], [address] Phone: [phone]   Fax:  [phone]  Physical Therapy Treatment  Patient Details  Name: Curtis Powell MRN: [id number] Date of Birth: [birth year] No data recorded  Encounter Date: 06/11/2018  PT End of Session - 06/11/18 1358    Visit Number  106    Number of Visits  121    Date for PT Re-Evaluation  08/04/18    Authorization Type  last goals: 06/09/18    Authorization Time Period  6/10     PT Start Time  1300    PT Stop Time  1346    PT Time Calculation (min)  46 min    Equipment Utilized During Treatment  Gait belt    Activity Tolerance  Patient tolerated treatment well;No increased pain;Patient limited by fatigue    Behavior During Therapy  [facility] for tasks assessed/performed       Past Medical History:  Diagnosis Date  . GERD (gastroesophageal reflux disease)   . Hyperlipidemia   . Hypertension   . Obesity   . Stroke [facility])     Past Surgical History:  Procedure Laterality Date  . BRAIN SURGERY      There were no vitals filed for this visit.  Subjective Assessment - 06/11/18 1355    Subjective  Patient reports that he is doing well today. He notes that his knees haven't been bothering him as much lately. He notes that he has been consistently performing HEP at home, but occasionally takes days off to rest. Denies stumbles/falls since last visit.    Pertinent History   Patient is a pleasant 53 year old male who presents to physical therapy for weakness and immobility secondary to CVA.  Had a stroke in Feb 2018. He was previously fully independent, but this stroke caused severe residual deficits, mainly on the right side as well as speech, and now he is unable to walk or perform most of his ADLs on his own. Entire right side is very weak. He still has a little difficulty with speech but his swallowing is improved to baseline. His mom had to move in with him and now  is his main caregiver.     Limitations  Sitting;Lifting;Standing;Walking;House hold activities    How long can you sit comfortably?  5 minutes    How long can you stand comfortably?  5 minutes    How long can you walk comfortably?  10 ft    Patient Stated Goals  Walk without walker, walk further with walker, get some strength in back     Currently in Pain?  No/denies    Pain Onset  --         Treatment:  Straight plane ambulation with RW x130 feet; requires CGA assist for balance and wheelchair follow; does not require seated or standing rest break this bout; benefits from tactile cues at hips/upper glutes to promote weight shift and step length; benefits from frequent verbal cueing for upright posture, weight shifting, and step length; demonstrates fatigue towards end of bout with increased respiratory rate, decreased weight shifting, decreased gait speed, and decreased step length bilaterally  Alternating forward stepping over TB band for step length x5; patient benefits from verbal and tactile cues and hips/upper glutes to promote weight shift and step length; requires less frequent verbal cues for upright posture  Lateral side stepping in // bars x 1 laps down and back with BUE support for  balance; SPT provides slight weight shift at hip to promote increased lateral step length and weight shift; patient demonstrates fatigue towards end of bout with decreased weight shifting and smaller steps   Seated clams red tband 2 x 10 each LE with 3 second holds   Seated adductor ball squeezes with rainbow ball 1x10 with 3 second holds; patient benefits from SPT counting for sequencing and rhythm of squeezing  Seated scapular retractions x10; patient benefits from tactile cues on thoracic spine as a target to squeeze   Seated hamstring stretch x60 seconds each LE                    PT Education - 06/11/18 1357    Education provided  Yes    Education Details  exercise  technique, sitting posture and posture with ambulation, weight shifting, HEP    Person(s) Educated  Patient    Methods  Explanation;Demonstration;Tactile cues;Verbal cues    Comprehension  Verbalized understanding;Returned demonstration;Verbal cues required;Tactile cues required;Need further instruction       PT Short Term Goals - 06/09/18 1415      PT SHORT TERM GOAL #1   Title  Patient will be independent in home exercise program to improve strength/mobility for better functional independence with ADLs.    Baseline  hep compliant    Time  2    Period  Weeks    Status  Achieved      PT SHORT TERM GOAL #2   Title  Patient will require min cueing for STS transfer with CGA for increased independence with mobility.     Baseline  CGA    Time  2    Period  Weeks    Status  Achieved      PT SHORT TERM GOAL #3   Title  Patient will maintain upright posture for > 15 seconds to demonstrate strengthened postural control muscualture     Baseline  18 seconds     Time  2    Period  Weeks    Status  Achieved        PT Long Term Goals - 06/09/18 1522      PT LONG TERM GOAL #1   Title  Patient will perform bed mobility with Supervision to increase independent mobility    Baseline  able to perform mod I for increased time , occasional Min A for scooting up in bed; 7/16: min guard for safety with increased effort and time, cues to scoot back in bed before sit>supine 9/12: can do it independently but with difficulty    Time  8    Period  Weeks    Status  Achieved      PT LONG TERM GOAL #2   Title  Patient (< 10 years old) will complete five times sit to stand test in < 10 seconds indicating an increased LE strength and improved balance.    Baseline  2/18: 30 seconds from plinth chair height single UE on seat and single on walker ; 32 seconds from plinth with single UE on seat and single on walker. 4/10: 26 seconds from [facility] with one hand on walker5/29: 22.2 seconds with one hand on walker one  hand on WC; 7/16: one hand on walker one hand on WC 18.96 seconds; pt achieving 75% of full upright standing 8/22:one hand on walker one hand on WC 18 seconds; pt achieving 75% of full upright standing  9/12: 18 seconds econds; pt achieving 75% of full  upright standing 8/22:one hand on walker one hand on WC 10/15: 17seconds; pt achieving 75% of full upright posture 11/7: 16 seconds; pt achieving 75 upright posture 12/19: 15 seconds 80% upright posture 3/3: 18 seconds 80% upright posture     Time  8    Period  Weeks    Status  Partially Met    Target Date  08/04/18      PT LONG TERM GOAL #3   Title  Patient will ambulate 40 ft with walker with no breaks to allow for increased mobility within home.     Baseline  12/26: 29 ft with walker and WC follow with no rest breaks; 2/18: multiple trials previous session of >100 ft     Time  8    Period  Weeks    Status  Achieved      PT LONG TERM GOAL #4   Title  Patient will increase BLE gross strength to 4+/5 as to improve functional strength for independent gait, increased standing tolerance and increased ADL ability.    Baseline   4-/5 RLE, 10/15: 4/5 RLE 11/7: 4/5 RLE 3/3: 4/5     Time  8    Period  Weeks    Status  Partially Met    Target Date  08/04/18      PT LONG TERM GOAL #5   Title  Patient will increase lower extremity functional scale to >40/80 to demonstrate improved functional mobility and increased tolerance with ADLs.     Baseline  14/80; 12/26: 21/80; 2/18: 28/80  4/10; 25/80 5/29: 27/80; 7/16: 29/80 8/22: 30/80  9/12: 26/80 (feeling more tired due to moving) 10/15: 37/80 11/7: 23/80 3/3: 32/80    Time  8    Period  Weeks    Status  Partially Met    Target Date  08/04/18      Additional Long Term Goals   Additional Long Term Goals  Yes      PT LONG TERM GOAL #6   Title  Patient will walk through a door involving one small step up/down to increase functional independence.     Baseline  require CGA  BUE support in // bars at this  time 10/15: required BUE and CGA in //bars    Time  8    Period  Weeks    Status  Deferred      PT LONG TERM GOAL #7   Title  Patient will ambulate 40 ft with least assistive device and Supervision with no breaks to allow for increased mobility within home.     Baseline  requires use of walker and CGA     Time  8    Period  Weeks    Status  Achieved      PT LONG TERM GOAL #8   Title  Patient will perform 10 MWT in < 1 minute to demonstrate improved ambulatory velocity and capacity.     Baseline  4/10: 2 minutes 45 seconds; 5/29: 2 minute 31 seconds; 3 minutes 22 seconds 8/22: 3 minutes 2 seconds  9/12:  2 minutes and 43 seconds 10/15: 20mnute 12 seconds 11/7: 1 minute 21 seconds 12/19: 1 min 16 seconds 3/3: 57 seconds    Time  8    Period  Weeks    Status  Achieved      PT LONG TERM GOAL  #9   TITLE  Patient will increase Berg Balance score by > 6 points (26/56) to demonstrate decreased fall risk  during functional activities.    Baseline  9/12: 20/56 10/15: 21/56 11/7: 20/56 12/19: 22/56     Time  8    Period  Weeks    Status  Partially Met      PT LONG TERM GOAL  #10   TITLE  Patient (< 40 years old) will complete five times sit to stand test with full upright posture in < 30 seconds indicating an increased glute activation, LE strength and improved balance.    Baseline  10/15: 37seconds 11/7: 20 seconds     Time  8    Period  Weeks    Status  Achieved      PT LONG TERM GOAL  #11   TITLE  Patient will ambulate 100 ft with least assistive device and Supervision with no breaks to allow for increased mobility within home.     Baseline  10/17: 97f 11/7: 1065f   Time  8    Period  Weeks    Status  Achieved      PT LONG TERM GOAL  #12   TITLE  Patient (< 6052ears old) will complete five times sit to stand test with full upright posture in < 15 seconds indicating increased glute activation, LE strength and improved balance.    Baseline  11/7: 20 seconds 12/19: 18 seconds 3/3: 18  seconds    Time  8    Period  Weeks    Status  Partially Met    Target Date  08/04/18      PT LONG TERM GOAL  #13   TITLE  Patient will ambulate 150 ft with least assistive device and Supervision with no breaks to allow for increased mobility within home.     Baseline  11/7: 10029f2/19: 115 ft  3/3: will assess next session, has done 200 ft with one rest break    Time  8    Period  Weeks    Status  Partially Met    Target Date  08/04/18      PT LONG TERM GOAL  #14   TITLE  Patient will increase 10 meter walk test to <30 seconds as to improve gait speed for better community ambulation and to reduce fall risk.    Baseline  3/3: 57 seconds with RW and CGA    Time  8    Period  Weeks    Status  New    Target Date  08/04/18            Plan - 06/11/18 1410    Clinical Impression Statement  The patient was able to ambulate 130 feet today with a rolling walker without a seated or standing rest break. Patient demonstrated increased gait speed initially as well as increased weight shifting and step length bilaterally as compared to previous sessions. Patient also ambulated without any significant losses of balance requiring SPT assistance to correct. The patient was also able to demonstrate good weight shifting and balance with lateral walking/side-stepping this session. However, the patient required frequent prolonged rest breaks after each exercise/activity today due to fatigue from his initial walk. At the end of session, patient denies pain and states that he felt like he got a good workout. The patient will continue to benefit from skilled PT in order to work towards goals, to increase strength, and to maximize safety and independence with functional mobility.     Rehab Potential  Fair    Clinical Impairments Affecting Rehab Potential  hx of HTN, HLD, CVA, learning disability, Diabetes, brain tumor,     PT Frequency  2x / week    PT Duration  8 weeks    PT Treatment/Interventions   ADLs/Self Care Home Management;Aquatic Therapy;Ultrasound;Moist Heat;Traction;DME Instruction;Gait training;Stair training;Functional mobility training;Therapeutic activities;Therapeutic exercise;Orthotic Fit/Training;Neuromuscular re-education;Balance training;Patient/family education;Manual techniques;Wheelchair mobility training;Passive range of motion;Energy conservation;Taping;Visual/perceptual remediation/compensation    PT Next Visit Plan  progress BLE strengthening, work on bed mobility/rolling, posture    PT Home Exercise Plan  TrA contraction, glute sets: needs updates. Potentially some time in supine flat in hospital bed, to improve hip extension deficits.     Consulted and Agree with Plan of Care  Patient       Patient will benefit from skilled therapeutic intervention in order to improve the following deficits and impairments:  Abnormal gait, Decreased activity tolerance, Decreased balance, Decreased knowledge of precautions, Decreased endurance, Decreased coordination, Decreased knowledge of use of DME, Decreased mobility, Decreased range of motion, Difficulty walking, Decreased safety awareness, Decreased strength, Impaired flexibility, Impaired perceived functional ability, Impaired tone, Postural dysfunction, Improper body mechanics, Pain  Visit Diagnosis: Muscle weakness (generalized)  Other lack of coordination  Other abnormalities of gait and mobility  Hemiplegia and hemiparesis following cerebral infarction affecting right dominant side [facility])     Problem List Patient Active Problem List   Diagnosis Date Noted  . Acute CVA (cerebrovascular accident) ([place]) 05/02/2016  . Left-sided weakness 05/01/2016   Orlean Patten, SPT  This entire session was performed under direct supervision and direction of a licensed therapist/therapist assistant . I have personally read, edited and approve of the note as written.  Janna Arch, PT, DPT   06/11/2018, 2:27 PM  [place] MAIN [facility] SERVICES [address] [place], [place], [address] Phone: [phone]   Fax:  [phone]  Name: Curtis Powell MRN: [id number] Date of Birth: December 20, 1965

## 2018-06-11 NOTE — Therapy (Signed)
[place] MAIN [facility] SERVICES [address] [place], [place], [address] Phone: [phone]   Fax:  [phone]  Occupational Therapy Treatment  Patient Details  Name: Curtis Powell MRN: [id number] Date of Birth: 1965-10-24 No data recorded  Encounter Date: 06/11/2018  OT End of Session - 06/11/18 1405    Visit Number  92    Number of Visits  113    Date for OT Re-Evaluation  07/16/18    Authorization Type  Progress reporting period starting 04/23/2018    OT Start Time  1351    OT Stop Time  1430    OT Time Calculation (min)  39 min    Equipment Utilized During Treatment  Long handled shoe horn    Activity Tolerance  Patient tolerated treatment well    Behavior During Therapy  WFL for tasks assessed/performed       Past Medical History:  Diagnosis Date  . GERD (gastroesophageal reflux disease)   . Hyperlipidemia   . Hypertension   . Obesity   . Stroke [facility])     Past Surgical History:  Procedure Laterality Date  . BRAIN SURGERY      There were no vitals filed for this visit.  Subjective Assessment - 06/11/18 1404    Subjective   Pt. was very late.    Pertinent History  Pt. is a 53 y.o. male who suffered a CVA on 05/01/2016. Pt. was admitted to the hospital. Once discharged, he received Home Health PT and OT services for about a month. Pt. has had multiple CVAs over the past 8 years, and has had multiple falls in the past 6 months. Pt. resides in an apartment. Pt. has caregivers for 80 hours. Pt.'s mother stays with pt. at night, and assists with IADL tasks.      Patient Stated Goals  To be able to throw a ball and dribble a ball, do as much as I can for myself.     Currently in Pain?  No/denies      OT TREATMENT   Therapeutic Exercise:  Pt. worked on right shoulder active ROM, and reaching. Pt. worked on moving objects through [employer] over 3 vertical rungs with less compensating, and leaning with the trunk this date. Pt.  Worked on reaching tasks to be able to reach for clothes in the closet, perform morning ADLs, and iron.  Selfcare:  Pt. Worked on toileting skills in standing with CGA. Pt. Required CGA to hike pants. Set-up for toilet hygiene.Pt. worked on sewing skills formulating a straight stitch with a medium sized sewing needle. Pt. required less cues today for needle placement on the fabric. Pt. requires reading glasses, and increased time. Pt. continues to work on formulating a straight stitch in order to be able to hem clothes, and sew a button onto a shirt.                           OT Education - 06/11/18 1405    Education provided  Yes    Education Details  RUE Functioning    Person(s) Educated  Patient    Methods  Explanation;Demonstration    Comprehension  Verbalized understanding;Returned demonstration;Verbal cues required       OT Short Term Goals - 03/03/17 1459      OT SHORT TERM GOAL #1   Title  `        OT Long Term Goals - 05/25/18  [place] #4   Title  Pt. will perform self dressing with minA and A/E as needed.    Baseline  Pt. continues to improve LB dressing in standing. Pt.  continues to require increased time and mod A donn socks, and pants over his feet. CGA standing to hike clothing. Pt. requires min A donning a jacket, and Independent doning a shirt.    Time  12    Period  Weeks    Status  On-going    Target Date  07/16/18      OT LONG TERM GOAL #6   Title  Pt. will write his name efficiently with 100% legibility    Baseline  04/23/2018: Pt continues to present with limited legibility and efficiency when writing name to sign in when riding the [place]    Time  12    Period  Weeks    Status  On-going    Target Date  07/16/18      OT LONG TERM GOAL #7   Title  Pt will independently and consistently follow HEP to increase UE strength to increase functional independence    Baseline  Pt to continues to work towards  independence with exercises    Time  12    Period  Weeks    Status  On-going    Target Date  07/16/18      OT LONG TERM GOAL #8   Title  Pt. will require supervision ironing a shirt.    Baseline  Pt. requires minA, and complete set-up seated using a tabletop iron    Time  12    Period  Weeks    Status  New    Target Date  07/16/18      OT LONG TERM GOAL  #9   Baseline  Pt. will independently be able to sew a button onto a shirt.    Time  12    Period  Weeks    Status  On-going    Target Date  07/16/18      OT LONG TERM GOAL  #10   TITLE  Pt. will independently be able to throw a ball    Baseline  04/23/2018: Pt. is unable.    Time  12    Period  Weeks    Status  On-going      OT LONG TERM GOAL  #11   TITLE  Pt. will improve UE functional reaching to be able to independently use his RUE to hand his clothes in the closest.    Baseline   Pt. continues to have difficulty hanging his clothes up in his closet.    Time  12    Period  Weeks    Status  On-going    Target Date  07/16/18            Plan - 06/11/18 1406    Clinical Impression Statement  Pt. is making progress with RUE reaching. Pt. continues to work on improving RUE strength, and [facility] skills which limit his ability to complete ADL, and and IADLs. Pt. continues to work on improving BUE functioning in order to maximize independence with ADLs, and IADLs including: self-dressing, writing legibly, sewing a button, hemmimg pants, and ironing clothes.     Occupational performance deficits (Please refer to evaluation for details):  ADL's;IADL's    Clinical Decision Making  Several treatment options, min-mod task modification necessary    OT Frequency  2x /  week    OT Duration  12 weeks    OT Treatment/Interventions  Self-care/ADL training;Patient/family education;DME and/or AE instruction;Therapeutic exercise;Moist Heat;Neuromuscular education    Consulted and Agree with Plan of Care  Patient       Patient will  benefit from skilled therapeutic intervention in order to improve the following deficits and impairments:     Visit Diagnosis: Muscle weakness (generalized)  Other lack of coordination    Problem List Patient Active Problem List   Diagnosis Date Noted  . Acute CVA (cerebrovascular accident) ([place]) 05/02/2016  . Left-sided weakness 05/01/2016    Harrel Carina, MS, OTR/L 06/11/2018, 2:27 PM  [place] MAIN [facility] SERVICES [address] [place], [place], [address] Phone: [phone]   Fax:  [phone]  Name: PAZ WINSETT MRN: [id number] Date of Birth: 1965-05-01

## 2018-06-16 ENCOUNTER — Encounter: Payer: Self-pay | Admitting: Physical Therapy

## 2018-06-16 ENCOUNTER — Ambulatory Visit: Payer: Medicare HMO | Admitting: Occupational Therapy

## 2018-06-16 ENCOUNTER — Ambulatory Visit: Payer: Medicare HMO | Admitting: Physical Therapy

## 2018-06-16 ENCOUNTER — Encounter: Payer: Self-pay | Admitting: Occupational Therapy

## 2018-06-16 DIAGNOSIS — R278 Other lack of coordination: Secondary | ICD-10-CM

## 2018-06-16 DIAGNOSIS — R2689 Other abnormalities of gait and mobility: Secondary | ICD-10-CM

## 2018-06-16 DIAGNOSIS — M6281 Muscle weakness (generalized): Secondary | ICD-10-CM

## 2018-06-16 NOTE — Therapy (Signed)
[place] MAIN [facility] SERVICES [address] [place], [place], [address] Phone: [phone]   Fax:  [phone]  Physical Therapy Treatment  Patient Details  Name: Curtis Powell MRN: [id number] Date of Birth: Apr 06, 1966 No data recorded  Encounter Date: 06/16/2018  PT End of Session - 06/16/18 1440    Visit Number  [id number]    Number of Visits  121    Date for PT Re-Evaluation  08/04/18    Authorization Type  last goals: 06/09/18    Authorization Time Period  7/10     PT Start Time  0220    PT Stop Time  0300    PT Time Calculation (min)  40 min    Equipment Utilized During Treatment  Gait belt    Activity Tolerance  Patient tolerated treatment well;No increased pain;Patient limited by fatigue    Behavior During Therapy  [facility] for tasks assessed/performed       Past Medical History:  Diagnosis Date  . GERD (gastroesophageal reflux disease)   . Hyperlipidemia   . Hypertension   . Obesity   . Stroke [facility])     Past Surgical History:  Procedure Laterality Date  . BRAIN SURGERY      There were no vitals filed for this visit.  Subjective Assessment - 06/16/18 1439    Subjective  Patient reports that he is doing well today. He notes that his knees haven't been bothering him as much lately. He notes that he has been consistently performing HEP at home, but occasionally takes days off to rest. Denies stumbles/falls since last visit.    Pertinent History   Patient is a pleasant 53 year old male who presents to physical therapy for weakness and immobility secondary to CVA.  Had a stroke in Feb 2018. He was previously fully independent, but this stroke caused severe residual deficits, mainly on the right side as well as speech, and now he is unable to walk or perform most of his ADLs on his own. Entire right side is very weak. He still has a little difficulty with speech but his swallowing is improved to baseline. His mom had to move in with him and now  is his main caregiver.     Limitations  Sitting;Lifting;Standing;Walking;House hold activities    How long can you sit comfortably?  5 minutes    How long can you stand comfortably?  5 minutes    How long can you walk comfortably?  10 ft    Patient Stated Goals  Walk without walker, walk further with walker, get some strength in back     Currently in Pain?  No/denies    Pain Score  0-No pain    Pain Onset  Yesterday       Treatment BLE: Standing hip abd x 10 x 2  Standing hip ext  x 10 x 2  Standing marching x 10 x 2  Standing heel raises x 20  Step ups to 6 inch stool x 10  Lunges to Bosu ball x 10  Sit to stand from chair with arms and UE support needed x 10                         PT Education - 06/16/18 1440    Education provided  Yes    Education Details  HEP    Person(s) Educated  Patient    Methods  Explanation;Demonstration;Tactile cues    Comprehension  Returned demonstration       PT Short Term Goals - 06/09/18 1415      PT SHORT TERM GOAL #1   Title  Patient will be independent in home exercise program to improve strength/mobility for better functional independence with ADLs.    Baseline  hep compliant    Time  2    Period  Weeks    Status  Achieved      PT SHORT TERM GOAL #2   Title  Patient will require min cueing for STS transfer with CGA for increased independence with mobility.     Baseline  CGA    Time  2    Period  Weeks    Status  Achieved      PT SHORT TERM GOAL #3   Title  Patient will maintain upright posture for > 15 seconds to demonstrate strengthened postural control muscualture     Baseline  18 seconds     Time  2    Period  Weeks    Status  Achieved        PT Long Term Goals - 06/09/18 1522      PT LONG TERM GOAL #1   Title  Patient will perform bed mobility with Supervision to increase independent mobility    Baseline  able to perform mod I for increased time , occasional Min A for scooting up in bed; 7/16:  min guard for safety with increased effort and time, cues to scoot back in bed before sit>supine 9/12: can do it independently but with difficulty    Time  8    Period  Weeks    Status  Achieved      PT LONG TERM GOAL #2   Title  Patient (< 70 years old) will complete five times sit to stand test in < 10 seconds indicating an increased LE strength and improved balance.    Baseline  2/18: 30 seconds from plinth chair height single UE on seat and single on walker ; 32 seconds from plinth with single UE on seat and single on walker. 4/10: 26 seconds from [facility] with one hand on walker5/29: 22.2 seconds with one hand on walker one hand on WC; 7/16: one hand on walker one hand on WC 18.96 seconds; pt achieving 75% of full upright standing 8/22:one hand on walker one hand on WC 18 seconds; pt achieving 75% of full upright standing  9/12: 18 seconds econds; pt achieving 75% of full upright standing 8/22:one hand on walker one hand on WC 10/15: 17seconds; pt achieving 75% of full upright posture 11/7: 16 seconds; pt achieving 75 upright posture 12/19: 15 seconds 80% upright posture 3/3: 18 seconds 80% upright posture     Time  8    Period  Weeks    Status  Partially Met    Target Date  08/04/18      PT LONG TERM GOAL #3   Title  Patient will ambulate 40 ft with walker with no breaks to allow for increased mobility within home.     Baseline  12/26: 29 ft with walker and WC follow with no rest breaks; 2/18: multiple trials previous session of >100 ft     Time  8    Period  Weeks    Status  Achieved      PT LONG TERM GOAL #4   Title  Patient will increase BLE gross strength to 4+/5 as to improve functional strength for independent gait, increased standing  tolerance and increased ADL ability.    Baseline   4-/5 RLE, 10/15: 4/5 RLE 11/7: 4/5 RLE 3/3: 4/5     Time  8    Period  Weeks    Status  Partially Met    Target Date  08/04/18      PT LONG TERM GOAL #5   Title  Patient will increase lower extremity  functional scale to >40/80 to demonstrate improved functional mobility and increased tolerance with ADLs.     Baseline  14/80; 12/26: 21/80; 2/18: 28/80  4/10; 25/80 5/29: 27/80; 7/16: 29/80 8/22: 30/80  9/12: 26/80 (feeling more tired due to moving) 10/15: 37/80 11/7: 23/80 3/3: 32/80    Time  8    Period  Weeks    Status  Partially Met    Target Date  08/04/18      Additional Long Term Goals   Additional Long Term Goals  Yes      PT LONG TERM GOAL #6   Title  Patient will walk through a door involving one small step up/down to increase functional independence.     Baseline  require CGA  BUE support in // bars at this time 10/15: required BUE and CGA in //bars    Time  8    Period  Weeks    Status  Deferred      PT LONG TERM GOAL #7   Title  Patient will ambulate 40 ft with least assistive device and Supervision with no breaks to allow for increased mobility within home.     Baseline  requires use of walker and CGA     Time  8    Period  Weeks    Status  Achieved      PT LONG TERM GOAL #8   Title  Patient will perform 10 MWT in < 1 minute to demonstrate improved ambulatory velocity and capacity.     Baseline  4/10: 2 minutes 45 seconds; 5/29: 2 minute 31 seconds; 3 minutes 22 seconds 8/22: 3 minutes 2 seconds  9/12:  2 minutes and 43 seconds 10/15: 89mnute 12 seconds 11/7: 1 minute 21 seconds 12/19: 1 min 16 seconds 3/3: 57 seconds    Time  8    Period  Weeks    Status  Achieved      PT LONG TERM GOAL  #9   TITLE  Patient will increase Berg Balance score by > 6 points (26/56) to demonstrate decreased fall risk during functional activities.    Baseline  9/12: 20/56 10/15: 21/56 11/7: 20/56 12/19: 22/56     Time  8    Period  Weeks    Status  Partially Met      PT LONG TERM GOAL  #10   TITLE  Patient (< 677years old) will complete five times sit to stand test with full upright posture in < 30 seconds indicating an increased glute activation, LE strength and improved balance.     Baseline  10/15: 37seconds 11/7: 20 seconds     Time  8    Period  Weeks    Status  Achieved      PT LONG TERM GOAL  #11   TITLE  Patient will ambulate 100 ft with least assistive device and Supervision with no breaks to allow for increased mobility within home.     Baseline  10/17: 851f11/7: 10091f  Time  8    Period  Weeks    Status  Achieved      PT LONG TERM GOAL  #12   TITLE  Patient (< 39 years old) will complete five times sit to stand test with full upright posture in < 15 seconds indicating increased glute activation, LE strength and improved balance.    Baseline  11/7: 20 seconds 12/19: 18 seconds 3/3: 18 seconds    Time  8    Period  Weeks    Status  Partially Met    Target Date  08/04/18      PT LONG TERM GOAL  #13   TITLE  Patient will ambulate 150 ft with least assistive device and Supervision with no breaks to allow for increased mobility within home.     Baseline  11/7: 173f 12/19: 115 ft  3/3: will assess next session, has done 200 ft with one rest break    Time  8    Period  Weeks    Status  Partially Met    Target Date  08/04/18      PT LONG TERM GOAL  #14   TITLE  Patient will increase 10 meter walk test to <30 seconds as to improve gait speed for better community ambulation and to reduce fall risk.    Baseline  3/3: 57 seconds with RW and CGA    Time  8    Period  Weeks    Status  New    Target Date  08/04/18            Plan - 06/16/18 1441    Clinical Impression Statement  Patient performs BLE strengthening exercises in standing including open chain and closed chain exercises with rest periods and trainng with sit to stand transfers. Patient will conitnue to benefit from skilled PT to improve mobility and strength.     Rehab Potential  Fair    Clinical Impairments Affecting Rehab Potential  hx of HTN, HLD, CVA, learning disability, Diabetes, brain tumor,     PT Frequency  2x / week    PT Duration  8 weeks    PT Treatment/Interventions   ADLs/Self Care Home Management;Aquatic Therapy;Ultrasound;Moist Heat;Traction;DME Instruction;Gait training;Stair training;Functional mobility training;Therapeutic activities;Therapeutic exercise;Orthotic Fit/Training;Neuromuscular re-education;Balance training;Patient/family education;Manual techniques;Wheelchair mobility training;Passive range of motion;Energy conservation;Taping;Visual/perceptual remediation/compensation    PT Next Visit Plan  progress BLE strengthening, work on bed mobility/rolling, posture    PT Home Exercise Plan  TrA contraction, glute sets: needs updates. Potentially some time in supine flat in hospital bed, to improve hip extension deficits.     Consulted and Agree with Plan of Care  Patient       Patient will benefit from skilled therapeutic intervention in order to improve the following deficits and impairments:  Abnormal gait, Decreased activity tolerance, Decreased balance, Decreased knowledge of precautions, Decreased endurance, Decreased coordination, Decreased knowledge of use of DME, Decreased mobility, Decreased range of motion, Difficulty walking, Decreased safety awareness, Decreased strength, Impaired flexibility, Impaired perceived functional ability, Impaired tone, Postural dysfunction, Improper body mechanics, Pain  Visit Diagnosis: Muscle weakness (generalized)  Other lack of coordination  Other abnormalities of gait and mobility     Problem List Patient Active Problem List   Diagnosis Date Noted  . [place]bro[place] 05/02[place]  . Left-sided weakness 05/01/2016    MAlanson Puls PVirginiaDPT 06/16/2018, 2:45 PM  [place] [facility] [address] [address]Phone: [phone]  Fax:  [phone] Name: BJAHSEH LUCCHESEMRN:  [id number] Date of Birth: Apr 16, 1965

## 2018-06-16 NOTE — Therapy (Signed)
[place] MAIN [facility] SERVICES [address] [place], [place], [address] Phone: [phone]   Fax:  [phone]  Occupational Therapy Treatment  Patient Details  Name: SIEGFRIED VIETH MRN: [id number] Date of Birth: 1965/12/25 No data recorded  Encounter Date: 06/16/2018  OT End of Session - 06/16/18 1341    Visit Number  93    Number of Visits  113    Date for OT Re-Evaluation  07/16/18    Authorization Type  Progress reporting period starting 04/23/2018    OT Start Time  1330    OT Stop Time  1415    OT Time Calculation (min)  45 min    Equipment Utilized During Treatment  Long handled shoe horn    Activity Tolerance  Patient tolerated treatment well    Behavior During Therapy  WFL for tasks assessed/performed       Past Medical History:  Diagnosis Date  . GERD (gastroesophageal reflux disease)   . Hyperlipidemia   . Hypertension   . Obesity   . Stroke [facility])     Past Surgical History:  Procedure Laterality Date  . BRAIN SURGERY      There were no vitals filed for this visit.  Subjective Assessment - 06/16/18 1336    Subjective   Pt. reports that he is moving to a new apartment next month.    Pertinent History  Pt. is a 53 y.o. male who suffered a CVA on 05/01/2016. Pt. was admitted to the hospital. Once discharged, he received Home Health PT and OT services for about a month. Pt. has had multiple CVAs over the past 8 years, and has had multiple falls in the past 6 months. Pt. resides in an apartment. Pt. has caregivers for 80 hours. Pt.'s mother stays with pt. at night, and assists with IADL tasks.      Currently in Pain?  No/denies       OT TREATMENT   Therapeutic Exercise:  Pt. worked on right shoulder active ROM, and reaching. Pt. worked on moving objects through [employer] over 3 vertical rungs with less compensating, and leaning with the trunk this date.Pt. Worked on reaching tasks to be able to reach for clothes in  the closet, perform morning ADLs, and iron.  Selfcare:  Pt. Worked on toileting skills in standing with CGA. Pt. Required CGA to hike pants. Set-up for toilet hygiene.Pt. worked on sewing skills formulating a straight stitch with a medium sized sewing needle. Pt. required less cues today for needle placement on the fabric. Pt. requires reading glasses, and increased time. Pt. continues to work on formulating a straight stitch in order to be able to hem clothes, and sew a button onto a shirt. Pt. worked on threading a needle with a medium sized eye. Pt. Had difficulty threading a needle.                     OT Education - 06/16/18 1341    Education provided  Yes    Education Details  RUE Functioning    Person(s) Educated  Patient    Methods  Explanation;Demonstration    Comprehension  Verbalized understanding;Returned demonstration;Verbal cues required       OT Short Term Goals - 03/03/17 1459      OT SHORT TERM GOAL #1   Title  `        OT Long Term Goals - 05/25/18 1344      OT LONG  TERM GOAL #4   Title  Pt. will perform self dressing with minA and A/E as needed.    Baseline  Pt. continues to improve LB dressing in standing. Pt.  continues to require increased time and mod A donn socks, and pants over his feet. CGA standing to hike clothing. Pt. requires min A donning a jacket, and Independent doning a shirt.    Time  12    Period  Weeks    Status  On-going    Target Date  07/16/18      OT LONG TERM GOAL #6   Title  Pt. will write his name efficiently with 100% legibility    Baseline  04/23/2018: Pt continues to present with limited legibility and efficiency when writing name to sign in when riding the [place]    Time  12    Period  Weeks    Status  On-going    Target Date  07/16/18      OT LONG TERM GOAL #7   Title  Pt will independently and consistently follow HEP to increase UE strength to increase functional independence    Baseline  Pt to continues to  work towards independence with exercises    Time  12    Period  Weeks    Status  On-going    Target Date  07/16/18      OT LONG TERM GOAL #8   Title  Pt. will require supervision ironing a shirt.    Baseline  Pt. requires minA, and complete set-up seated using a tabletop iron    Time  12    Period  Weeks    Status  New    Target Date  07/16/18      OT LONG TERM GOAL  #9   Baseline  Pt. will independently be able to sew a button onto a shirt.    Time  12    Period  Weeks    Status  On-going    Target Date  07/16/18      OT LONG TERM GOAL  #10   TITLE  Pt. will independently be able to throw a ball    Baseline  04/23/2018: Pt. is unable.    Time  12    Period  Weeks    Status  On-going      OT LONG TERM GOAL  #11   TITLE  Pt. will improve UE functional reaching to be able to independently use his RUE to hand his clothes in the closest.    Baseline   Pt. continues to have difficulty hanging his clothes up in his closet.    Time  12    Period  Weeks    Status  On-going    Target Date  07/16/18            Plan - 06/16/18 1341    Clinical Impression Statement  Pt. reported that he went to the bathroom  by himself prior to the treatment session. Pt. reported having some difficulty getting the w/c wheel over the doorway threshold. Pt. continues to present with limited RUE strengthening, functional reaching and [facility] skills which hinders his ability to reach into cabinetry, perform sewing, and ironing. Pt. continues to work on improving UE functioning in order to improve ADL, and IADL functioning.    Occupational performance deficits (Please refer to evaluation for details):  ADL's;IADL's    Rehab Potential  Fair    Clinical Decision Making  Several treatment options, min-mod  task modification necessary    OT Frequency  2x / week    OT Duration  12 weeks    OT Treatment/Interventions  Self-care/ADL training;Patient/family education;DME and/or AE instruction;Therapeutic  exercise;Moist Heat;Neuromuscular education    Consulted and Agree with Plan of Care  Patient       Patient will benefit from skilled therapeutic intervention in order to improve the following deficits and impairments:     Visit Diagnosis: Muscle weakness (generalized)  Other lack of coordination    Problem List Patient Active Problem List   Diagnosis Date Noted  . Acute CVA (cerebrovascular accident) ([place]) 05/02/2016  . Left-sided weakness 05/01/2016    Harrel Carina, MS, OTR/L 06/16/2018, 1:51 PM  [place] MAIN [facility] SERVICES [address] [place], [place], [address] Phone: [phone]   Fax:  [phone]  Name: CLOY COZZENS MRN: [id number] Date of Birth: 02-Nov-1965

## 2018-06-18 ENCOUNTER — Encounter: Payer: Self-pay | Admitting: Occupational Therapy

## 2018-06-18 ENCOUNTER — Ambulatory Visit: Payer: Medicare HMO

## 2018-06-18 ENCOUNTER — Other Ambulatory Visit: Payer: Self-pay

## 2018-06-18 ENCOUNTER — Ambulatory Visit: Payer: Medicare HMO | Admitting: Occupational Therapy

## 2018-06-18 DIAGNOSIS — M6281 Muscle weakness (generalized): Secondary | ICD-10-CM | POA: Diagnosis not present

## 2018-06-18 DIAGNOSIS — R2689 Other abnormalities of gait and mobility: Secondary | ICD-10-CM

## 2018-06-18 DIAGNOSIS — R278 Other lack of coordination: Secondary | ICD-10-CM

## 2018-06-18 NOTE — Therapy (Addendum)
[place] MAIN [facility] SERVICES [address] [place], [place], [address] Phone: [phone]   Fax:  [phone]  Physical Therapy Treatment  Patient Details  Name: Curtis Powell MRN: [id number] Date of Birth: January 21, 1966 No data recorded  Encounter Date: 06/18/2018  PT End of Session - 06/18/18 1436    Visit Number  108    Number of Visits  121    Date for PT Re-Evaluation  08/04/18    Authorization Type  last goals: 06/09/18    Authorization Time Period  8/10     PT Start Time  1305    PT Stop Time  1345    PT Time Calculation (min)  40 min    Equipment Utilized During Treatment  Gait belt    Activity Tolerance  Patient tolerated treatment well;No increased pain;Patient limited by fatigue    Behavior During Therapy  [facility] for tasks assessed/performed       Past Medical History:  Diagnosis Date  . GERD (gastroesophageal reflux disease)   . Hyperlipidemia   . Hypertension   . Obesity   . Stroke [facility])     Past Surgical History:  Procedure Laterality Date  . BRAIN SURGERY      There were no vitals filed for this visit.  Subjective Assessment - 06/18/18 1434    Subjective  Patient notes that he is doing well and denies pain today. Denies stumbles/falls since last visit. Patient indicates that he is still interested in practicing bed mobility today.    Pertinent History   Patient is a pleasant 53 year old male who presents to physical therapy for weakness and immobility secondary to CVA.  Had a stroke in Feb 2018. He was previously fully independent, but this stroke caused severe residual deficits, mainly on the right side as well as speech, and now he is unable to walk or perform most of his ADLs on his own. Entire right side is very weak. He still has a little difficulty with speech but his swallowing is improved to baseline. His mom had to move in with him and now is his main caregiver.     Limitations   Sitting;Lifting;Standing;Walking;House hold activities    How long can you sit comfortably?  5 minutes    How long can you stand comfortably?  5 minutes    How long can you walk comfortably?  10 ft    Patient Stated Goals  Walk without walker, walk further with walker, get some strength in back     Currently in Pain?  No/denies    Pain Onset  --        Therapeutic Activity:  -Patient performs 2 stand step pivot transfer with RW and moderate assistance x1 on first transfer to correct initial posterior lean/LOB upon standing; rest of transfers CGA performed with gait belt; patient benefits from verbal cues to stand up tall  -Patient performs sitting EOB to supine transfer with minimal assistance x1 to bring patient's BLE onto bed  -Patient performs rolling from supine to prone towards L x3 with moderate assistance x1 with moderate verbal and tactile cues for placement/repositioning of BUE (specifically to remove LUE from under him in prone) and BLE and to push with BLE to assist with roll/propulsion  -Supine lumbar rotations x10 with 3 second holds to each side from SPT to stretch low back to prevent recurrence of slight mid-lower back pain during peformance of first roll from supine to and from prone  -  Patient performs rolling from prone to supine towards R x3 with minimal assistance x1 with minimal verbal and tactile cues for placement of BUE and BLE  -Patient instructed on and performs scooting L and R in bed in supine 2x3 feet to L and 2x3 feet to R using bridging; patient requires moderate verbal and tactile cues for placement of BUE and BLE and to push with BLE to assist with rolling; patient requires minimal assistance x1 initially to help move hips laterally, but then requires maximal assistance x1 upon fatigue  -Supine TrA activation for abdominal strengthening x10 with 3 second holds; patient benefits from verbal cues for counting and breathing   -Patient with CGA for toileting  needs, no physical assist needed                PT Education - 06/18/18 1435    Education provided  Yes    Education Details  bed mobility, stability, posture, BLE and core strengthening    Person(s) Educated  Patient    Methods  Explanation;Demonstration;Tactile cues;Verbal cues    Comprehension  Verbalized understanding;Returned demonstration;Verbal cues required;Tactile cues required;Need further instruction       PT Short Term Goals - 06/09/18 1415      PT SHORT TERM GOAL #1   Title  Patient will be independent in home exercise program to improve strength/mobility for better functional independence with ADLs.    Baseline  hep compliant    Time  2    Period  Weeks    Status  Achieved      PT SHORT TERM GOAL #2   Title  Patient will require min cueing for STS transfer with CGA for increased independence with mobility.     Baseline  CGA    Time  2    Period  Weeks    Status  Achieved      PT SHORT TERM GOAL #3   Title  Patient will maintain upright posture for > 15 seconds to demonstrate strengthened postural control muscualture     Baseline  18 seconds     Time  2    Period  Weeks    Status  Achieved        PT Long Term Goals - 06/09/18 1522      PT LONG TERM GOAL #1   Title  Patient will perform bed mobility with Supervision to increase independent mobility    Baseline  able to perform mod I for increased time , occasional Min A for scooting up in bed; 7/16: min guard for safety with increased effort and time, cues to scoot back in bed before sit>supine 9/12: can do it independently but with difficulty    Time  8    Period  Weeks    Status  Achieved      PT LONG TERM GOAL #2   Title  Patient (< 38 years old) will complete five times sit to stand test in < 10 seconds indicating an increased LE strength and improved balance.    Baseline  2/18: 30 seconds from plinth chair height single UE on seat and single on walker ; 32 seconds from plinth with single  UE on seat and single on walker. 4/10: 26 seconds from [facility] with one hand on walker5/29: 22.2 seconds with one hand on walker one hand on WC; 7/16: one hand on walker one hand on WC 18.96 seconds; pt achieving 75% of full upright standing 8/22:one hand on walker one hand on  WC 18 seconds; pt achieving 75% of full upright standing  9/12: 18 seconds econds; pt achieving 75% of full upright standing 8/22:one hand on walker one hand on WC 10/15: 17seconds; pt achieving 75% of full upright posture 11/7: 16 seconds; pt achieving 75 upright posture 12/19: 15 seconds 80% upright posture 3/3: 18 seconds 80% upright posture     Time  8    Period  Weeks    Status  Partially Met    Target Date  08/04/18      PT LONG TERM GOAL #3   Title  Patient will ambulate 40 ft with walker with no breaks to allow for increased mobility within home.     Baseline  12/26: 29 ft with walker and WC follow with no rest breaks; 2/18: multiple trials previous session of >100 ft     Time  8    Period  Weeks    Status  Achieved      PT LONG TERM GOAL #4   Title  Patient will increase BLE gross strength to 4+/5 as to improve functional strength for independent gait, increased standing tolerance and increased ADL ability.    Baseline   4-/5 RLE, 10/15: 4/5 RLE 11/7: 4/5 RLE 3/3: 4/5     Time  8    Period  Weeks    Status  Partially Met    Target Date  08/04/18      PT LONG TERM GOAL #5   Title  Patient will increase lower extremity functional scale to >40/80 to demonstrate improved functional mobility and increased tolerance with ADLs.     Baseline  14/80; 12/26: 21/80; 2/18: 28/80  4/10; 25/80 5/29: 27/80; 7/16: 29/80 8/22: 30/80  9/12: 26/80 (feeling more tired due to moving) 10/15: 37/80 11/7: 23/80 3/3: 32/80    Time  8    Period  Weeks    Status  Partially Met    Target Date  08/04/18      Additional Long Term Goals   Additional Long Term Goals  Yes      PT LONG TERM GOAL #6   Title  Patient will walk through a door  involving one small step up/down to increase functional independence.     Baseline  require CGA  BUE support in // bars at this time 10/15: required BUE and CGA in //bars    Time  8    Period  Weeks    Status  Deferred      PT LONG TERM GOAL #7   Title  Patient will ambulate 40 ft with least assistive device and Supervision with no breaks to allow for increased mobility within home.     Baseline  requires use of walker and CGA     Time  8    Period  Weeks    Status  Achieved      PT LONG TERM GOAL #8   Title  Patient will perform 10 MWT in < 1 minute to demonstrate improved ambulatory velocity and capacity.     Baseline  4/10: 2 minutes 45 seconds; 5/29: 2 minute 31 seconds; 3 minutes 22 seconds 8/22: 3 minutes 2 seconds  9/12:  2 minutes and 43 seconds 10/15: 66mnute 12 seconds 11/7: 1 minute 21 seconds 12/19: 1 min 16 seconds 3/3: 57 seconds    Time  8    Period  Weeks    Status  Achieved      PT LONG TERM GOAL  #9  TITLE  Patient will increase Berg Balance score by > 6 points (26/56) to demonstrate decreased fall risk during functional activities.    Baseline  9/12: 20/56 10/15: 21/56 11/7: 20/56 12/19: 22/56     Time  8    Period  Weeks    Status  Partially Met      PT LONG TERM GOAL  #10   TITLE  Patient (< 83 years old) will complete five times sit to stand test with full upright posture in < 30 seconds indicating an increased glute activation, LE strength and improved balance.    Baseline  10/15: 37seconds 11/7: 20 seconds     Time  8    Period  Weeks    Status  Achieved      PT LONG TERM GOAL  #11   TITLE  Patient will ambulate 100 ft with least assistive device and Supervision with no breaks to allow for increased mobility within home.     Baseline  10/17: 76f 11/7: 1016f   Time  8    Period  Weeks    Status  Achieved      PT LONG TERM GOAL  #12   TITLE  Patient (< [age]ears old) will complete five times sit to stand test with full upright posture in < 15 seconds  indicating increased glute activation, LE strength and improved balance.    Baseline  11/7: 20 seconds 12/19: 18 seconds 3/3: 18 seconds    Time  8    Period  Weeks    Status  Partially Met    Target Date  08/04/18      PT LONG TERM GOAL  #13   TITLE  Patient will ambulate 150 ft with least assistive device and Supervision with no breaks to allow for increased mobility within home.     Baseline  11/7: 10016f2/19: 115 ft  3/3: will assess next session, has done 200 ft with one rest break    Time  8    Period  Weeks    Status  Partially Met    Target Date  08/04/18      PT LONG TERM GOAL  #14   TITLE  Patient will increase 10 meter walk test to <30 seconds as to improve gait speed for better community ambulation and to reduce fall risk.    Baseline  3/3: 57 seconds with RW and CGA    Time  8    Period  Weeks    Status  New    Target Date  08/04/18            Plan - 06/18/18 1512    Clinical Impression Statement  The patient was educated on and practiced bed mobility today. The patient demonstrated increased difficulty with rolling from supine to prone as compared to from prone to supine. The patient also demonstrated increased difficulty with repositioning and moving his UEs and trunk, but demonstrated good ability to effectively use bridging to move and reposition his hips and LEs (although he fatigues quickly). As the patient continues to improve his global strength and endurance (trunk, core, UEs, LEs), his bed mobility should improve from a time, efficiency, and independence standpoint. The patient will continue to benefit from skilled PT in order to work towards goals, to increase strength, and to maximize safety and independence with functional mobility.     Rehab Potential  Fair    Clinical Impairments Affecting Rehab Potential  hx of HTN,  HLD, CVA, learning disability, Diabetes, brain tumor,     PT Frequency  2x / week    PT Duration  8 weeks    PT Treatment/Interventions   ADLs/Self Care Home Management;Aquatic Therapy;Ultrasound;Moist Heat;Traction;DME Instruction;Gait training;Stair training;Functional mobility training;Therapeutic activities;Therapeutic exercise;Orthotic Fit/Training;Neuromuscular re-education;Balance training;Patient/family education;Manual techniques;Wheelchair mobility training;Passive range of motion;Energy conservation;Taping;Visual/perceptual remediation/compensation    PT Next Visit Plan  progress BLE strengthening, work on bed mobility/rolling, posture    PT Home Exercise Plan  TrA contraction, glute sets: needs updates. Potentially some time in supine flat in hospital bed, to improve hip extension deficits.     Consulted and Agree with Plan of Care  Patient       Patient will benefit from skilled therapeutic intervention in order to improve the following deficits and impairments:  Abnormal gait, Decreased activity tolerance, Decreased balance, Decreased knowledge of precautions, Decreased endurance, Decreased coordination, Decreased knowledge of use of DME, Decreased mobility, Decreased range of motion, Difficulty walking, Decreased safety awareness, Decreased strength, Impaired flexibility, Impaired perceived functional ability, Impaired tone, Postural dysfunction, Improper body mechanics, Pain  Visit Diagnosis: Muscle weakness (generalized)  Other lack of coordination  Other abnormalities of gait and mobility     Problem List Patient Active Problem List   Diagnosis Date Noted  . Acute CVA (cerebrovascular accident) ([place]) 05/02/2016  . Left-sided weakness 05/01/2016   Orlean Patten, SPT  This entire session was performed under direct supervision and direction of a licensed therapist/therapist assistant . I have personally read, edited and approve of the note as written.  Janna Arch, PT, DPT   06/18/2018, 3:20 PM  [place] MAIN [facility] SERVICES [address] [place], [place],  [address] Phone: [phone]   Fax:  [phone]  Name: Curtis Powell MRN: [id number] Date of Birth: 17-Jan-1966

## 2018-06-18 NOTE — Therapy (Signed)
[place] MAIN [facility] SERVICES [address] [place], [place], [address] Phone: [phone]   Fax:  [phone]  Occupational Therapy Treatment  Patient Details  Name: Curtis Powell MRN: [id number] Date of Birth: 10/13/1965 No data recorded  Encounter Date: 06/18/2018  OT End of Session - 06/18/18 1414    Visit Number  94    Number of Visits  113    Date for OT Re-Evaluation  07/16/18    Authorization Type  Progress reporting period starting 04/23/2018    OT Start Time  1350    OT Stop Time  1430    OT Time Calculation (min)  40 min    Equipment Utilized During Treatment  Long handled shoe horn    Activity Tolerance  Patient tolerated treatment well    Behavior During Therapy  WFL for tasks assessed/performed       Past Medical History:  Diagnosis Date  . GERD (gastroesophageal reflux disease)   . Hyperlipidemia   . Hypertension   . Obesity   . Stroke [facility])     Past Surgical History:  Procedure Laterality Date  . BRAIN SURGERY      There were no vitals filed for this visit.  Subjective Assessment - 06/18/18 1405    Subjective   Pt. still anticipates moving to another apartment next month/    Pertinent History  Pt. is a 53 y.o. male who suffered a CVA on 05/01/2016. Pt. was admitted to the hospital. Once discharged, he received Home Health PT and OT services for about a month. Pt. has had multiple CVAs over the past 8 years, and has had multiple falls in the past 6 months. Pt. resides in an apartment. Pt. has caregivers for 80 hours. Pt.'s mother stays with pt. at night, and assists with IADL tasks.      Patient Stated Goals  To be able to throw a ball and dribble a ball, do as much as I can for myself.     Currently in Pain?  No/denies      OT Treatment  Therapeutic Exercise:  Pt. worked on right shoulder active ROM, and reaching. Pt. worked on moving objects through [employer] over 3 vertical rungs with less  compensating, and leaning with the trunk this date.Pt. completed 2 sets over the 3rd rung. Pt. worked on reaching tasks to be able to reach for clothes in the closet, perform morning ADLs, and iron.  Selfcare:  Pt. worked on sewing skills threading a small sized sewing needle using small sized thread, and a small needle to sew a two hole button on a shirt. Pt. had difficulty threading the needle.                       OT Education - 06/18/18 1414    Education provided  Yes    Education Details  RUE Functioning    Person(s) Educated  Patient    Methods  Explanation;Demonstration    Comprehension  Verbalized understanding;Returned demonstration;Verbal cues required       OT Short Term Goals - 03/03/17 1459      OT SHORT TERM GOAL #1   Title  `        OT Long Term Goals - 05/25/18 1344      OT LONG TERM GOAL #4   Title  Pt. will perform self dressing with minA and A/E as needed.    Baseline  Pt. continues to  improve LB dressing in standing. Pt.  continues to require increased time and mod A donn socks, and pants over his feet. CGA standing to hike clothing. Pt. requires min A donning a jacket, and Independent doning a shirt.    Time  12    Period  Weeks    Status  On-going    Target Date  07/16/18      OT LONG TERM GOAL #6   Title  Pt. will write his name efficiently with 100% legibility    Baseline  04/23/2018: Pt continues to present with limited legibility and efficiency when writing name to sign in when riding the [place]    Time  12    Period  Weeks    Status  On-going    Target Date  07/16/18      OT LONG TERM GOAL #7   Title  Pt will independently and consistently follow HEP to increase UE strength to increase functional independence    Baseline  Pt to continues to work towards independence with exercises    Time  12    Period  Weeks    Status  On-going    Target Date  07/16/18      OT LONG TERM GOAL #8   Title  Pt. will require supervision  ironing a shirt.    Baseline  Pt. requires minA, and complete set-up seated using a tabletop iron    Time  12    Period  Weeks    Status  New    Target Date  07/16/18      OT LONG TERM GOAL  #9   Baseline  Pt. will independently be able to sew a button onto a shirt.    Time  12    Period  Weeks    Status  On-going    Target Date  07/16/18      OT LONG TERM GOAL  #10   TITLE  Pt. will independently be able to throw a ball    Baseline  04/23/2018: Pt. is unable.    Time  12    Period  Weeks    Status  On-going      OT LONG TERM GOAL  #11   TITLE  Pt. will improve UE functional reaching to be able to independently use his RUE to hand his clothes in the closest.    Baseline   Pt. continues to have difficulty hanging his clothes up in his closet.    Time  12    Period  Weeks    Status  On-going    Target Date  07/16/18            Plan - 06/18/18 1415    Clinical Impression Statement  Pt. is making steady progress. Pt. continues to work on improving RUE functional reaching, RUE strength, and [facility] skills in order to be able to reach into cabinetry, reach over head, perform ironing, and  sewing a button, and hemming clothing.    Occupational performance deficits (Please refer to evaluation for details):  ADL's;IADL's    Rehab Potential  Fair    Clinical Decision Making  Several treatment options, min-mod task modification necessary    OT Frequency  2x / week    OT Duration  12 weeks    OT Treatment/Interventions  Self-care/ADL training;Patient/family education;DME and/or AE instruction;Therapeutic exercise;Moist Heat;Neuromuscular education    Consulted and Agree with Plan of Care  Patient       Patient will  benefit from skilled therapeutic intervention in order to improve the following deficits and impairments:     Visit Diagnosis: Muscle weakness (generalized)  Other lack of coordination    Problem List Patient Active Problem List   Diagnosis Date Noted  . Acute  CVA (cerebrovascular accident) ([place]) 05/02/2016  . Left-sided weakness 05/01/2016    Harrel Carina, MS, OTR/L 06/18/2018, 2:25 PM  [place] MAIN [facility] SERVICES [address] [place], [place], [address] Phone: [phone]   Fax:  [phone]  Name: Curtis Powell ALABI MRN: [id number] Date of Birth: [birth year]

## 2018-06-23 ENCOUNTER — Ambulatory Visit: Payer: Medicare HMO

## 2018-06-23 ENCOUNTER — Ambulatory Visit: Payer: Medicare HMO | Admitting: Occupational Therapy

## 2018-06-25 ENCOUNTER — Encounter: Payer: Self-pay | Admitting: Occupational Therapy

## 2018-06-25 ENCOUNTER — Other Ambulatory Visit: Payer: Self-pay

## 2018-06-25 ENCOUNTER — Ambulatory Visit: Payer: Medicare HMO | Admitting: Occupational Therapy

## 2018-06-25 ENCOUNTER — Ambulatory Visit: Payer: Medicare HMO

## 2018-06-25 DIAGNOSIS — R278 Other lack of coordination: Secondary | ICD-10-CM

## 2018-06-25 DIAGNOSIS — R2689 Other abnormalities of gait and mobility: Secondary | ICD-10-CM

## 2018-06-25 DIAGNOSIS — M6281 Muscle weakness (generalized): Secondary | ICD-10-CM

## 2018-06-25 DIAGNOSIS — I69351 Hemiplegia and hemiparesis following cerebral infarction affecting right dominant side: Secondary | ICD-10-CM

## 2018-06-25 NOTE — Therapy (Signed)
[place] MAIN [facility] SERVICES [address] [place], [place], [address] Phone: [phone]   Fax:  [phone]  Physical Therapy Treatment  Patient Details  Name: Curtis Powell MRN: [id number] Date of Birth: May 12, 1965 No data recorded  Encounter Date: 06/25/2018  PT End of Session - 06/25/18 1442    Visit Number  109    Number of Visits  121    Date for PT Re-Evaluation  08/04/18    Authorization Type  last goals: 06/09/18    Authorization Time Period  9/10    PT Start Time  1433    PT Stop Time  1513    PT Time Calculation (min)  40 min    Activity Tolerance  Patient tolerated treatment well;No increased pain;Patient limited by fatigue    Behavior During Therapy  [facility] for tasks assessed/performed       Past Medical History:  Diagnosis Date  . GERD (gastroesophageal reflux disease)   . Hyperlipidemia   . Hypertension   . Obesity   . Stroke [facility])     Past Surgical History:  Procedure Laterality Date  . BRAIN SURGERY      There were no vitals filed for this visit.  Subjective Assessment - 06/25/18 1441    Subjective  Pt doing well this date, reports he'll be moving next week with help from family.     Pertinent History   Patient is a pleasant 53 year old male who presents to physical therapy for weakness and immobility secondary to CVA.  Had a stroke in Feb 2018. He was previously fully independent, but this stroke caused severe residual deficits, mainly on the right side as well as speech, and now he is unable to walk or perform most of his ADLs on his own. Entire right side is very weak. He still has a little difficulty with speech but his swallowing is improved to baseline. His mom had to move in with him and now is his main caregiver.     Currently in Pain?  No/denies      INTERVENTION THIS DATE:  Therapeutic Exercise: -Stand pivot transfer WC to hi/low mat @ supervision -Supine x3 minutes (bilat hip flexor stretch)  -supine  heel slides (self assist with gait belt, minA for setup) 1x15 bilat -Abdct/add heel slides (self assist with gait belt, MinA for setup) 1x15 bilat -hooklying bridging 2x10 -hooklying marching 2x20 reciprocal pattern  -STS transfer in BR for toileting  -Tall standing with back against wall (RW in place PRN) x3 minutes -Scapular retraction against wall, standing without UE assistance 15x3secH  -Tall standing without wall lean, zero UE support: 3x30sec (mina for LOB correction >5x)    PT Short Term Goals - 06/09/18 1415      PT SHORT TERM GOAL #1   Title  Patient will be independent in home exercise program to improve strength/mobility for better functional independence with ADLs.    Baseline  hep compliant    Time  2    Period  Weeks    Status  Achieved      PT SHORT TERM GOAL #2   Title  Patient will require min cueing for STS transfer with CGA for increased independence with mobility.     Baseline  CGA    Time  2    Period  Weeks    Status  Achieved      PT SHORT TERM GOAL #3   Title  Patient will maintain upright posture  for > 15 seconds to demonstrate strengthened postural control muscualture     Baseline  18 seconds     Time  2    Period  Weeks    Status  Achieved        PT Long Term Goals - 06/09/18 1522      PT LONG TERM GOAL #1   Title  Patient will perform bed mobility with Supervision to increase independent mobility    Baseline  able to perform mod I for increased time , occasional Min A for scooting up in bed; 7/16: min guard for safety with increased effort and time, cues to scoot back in bed before sit>supine 9/12: can do it independently but with difficulty    Time  8    Period  Weeks    Status  Achieved      PT LONG TERM GOAL #2   Title  Patient (< 32 years old) will complete five times sit to stand test in < 10 seconds indicating an increased LE strength and improved balance.    Baseline  2/18: 30 seconds from plinth chair height single UE on seat and  single on walker ; 32 seconds from plinth with single UE on seat and single on walker. 4/10: 26 seconds from [facility] with one hand on walker5/29: 22.2 seconds with one hand on walker one hand on WC; 7/16: one hand on walker one hand on WC 18.96 seconds; pt achieving 75% of full upright standing 8/22:one hand on walker one hand on WC 18 seconds; pt achieving 75% of full upright standing  9/12: 18 seconds econds; pt achieving 75% of full upright standing 8/22:one hand on walker one hand on WC 10/15: 17seconds; pt achieving 75% of full upright posture 11/7: 16 seconds; pt achieving 75 upright posture 12/19: 15 seconds 80% upright posture 3/3: 18 seconds 80% upright posture     Time  8    Period  Weeks    Status  Partially Met    Target Date  08/04/18      PT LONG TERM GOAL #3   Title  Patient will ambulate 40 ft with walker with no breaks to allow for increased mobility within home.     Baseline  12/26: 29 ft with walker and WC follow with no rest breaks; 2/18: multiple trials previous session of >100 ft     Time  8    Period  Weeks    Status  Achieved      PT LONG TERM GOAL #4   Title  Patient will increase BLE gross strength to 4+/5 as to improve functional strength for independent gait, increased standing tolerance and increased ADL ability.    Baseline   4-/5 RLE, 10/15: 4/5 RLE 11/7: 4/5 RLE 3/3: 4/5     Time  8    Period  Weeks    Status  Partially Met    Target Date  08/04/18      PT LONG TERM GOAL #5   Title  Patient will increase lower extremity functional scale to >40/80 to demonstrate improved functional mobility and increased tolerance with ADLs.     Baseline  14/80; 12/26: 21/80; 2/18: 28/80  4/10; 25/80 5/29: 27/80; 7/16: 29/80 8/22: 30/80  9/12: 26/80 (feeling more tired due to moving) 10/15: 37/80 11/7: 23/80 3/3: 32/80    Time  8    Period  Weeks    Status  Partially Met    Target Date  08/04/18  Additional Long Term Goals   Additional Long Term Goals  Yes      PT LONG  TERM GOAL #6   Title  Patient will walk through a door involving one small step up/down to increase functional independence.     Baseline  require CGA  BUE support in // bars at this time 10/15: required BUE and CGA in //bars    Time  8    Period  Weeks    Status  Deferred      PT LONG TERM GOAL #7   Title  Patient will ambulate 40 ft with least assistive device and Supervision with no breaks to allow for increased mobility within home.     Baseline  requires use of walker and CGA     Time  8    Period  Weeks    Status  Achieved      PT LONG TERM GOAL #8   Title  Patient will perform 10 MWT in < 1 minute to demonstrate improved ambulatory velocity and capacity.     Baseline  4/10: 2 minutes 45 seconds; 5/29: 2 minute 31 seconds; 3 minutes 22 seconds 8/22: 3 minutes 2 seconds  9/12:  2 minutes and 43 seconds 10/15: 9mnute 12 seconds 11/7: 1 minute 21 seconds 12/19: 1 min 16 seconds 3/3: 57 seconds    Time  8    Period  Weeks    Status  Achieved      PT LONG TERM GOAL  #9   TITLE  Patient will increase Berg Balance score by > 6 points (26/56) to demonstrate decreased fall risk during functional activities.    Baseline  9/12: 20/56 10/15: 21/56 11/7: 20/56 12/19: 22/56     Time  8    Period  Weeks    Status  Partially Met      PT LONG TERM GOAL  #10   TITLE  Patient (< 726years old) will complete five times sit to stand test with full upright posture in < 30 seconds indicating an increased glute activation, LE strength and improved balance.    Baseline  10/15: 37seconds 11/7: 20 seconds     Time  8    Period  Weeks    Status  Achieved      PT LONG TERM GOAL  #11   TITLE  Patient will ambulate 100 ft with least assistive device and Supervision with no breaks to allow for increased mobility within home.     Baseline  10/17: 812f11/7: 10072f  Time  8    Period  Weeks    Status  Achieved      PT LONG TERM GOAL  #12   TITLE  Patient (< 60 68ars old) will complete five times sit  to stand test with full upright posture in < 15 seconds indicating increased glute activation, LE strength and improved balance.    Baseline  11/7: 20 seconds 12/19: 18 seconds 3/3: 18 seconds    Time  8    Period  Weeks    Status  Partially Met    Target Date  08/04/18      PT LONG TERM GOAL  #13   TITLE  Patient will ambulate 150 ft with least assistive device and Supervision with no breaks to allow for increased mobility within home.     Baseline  11/7: 100f37f/19: 115 ft  3/3: will assess next session, has done 200 ft with one rest  break    Time  8    Period  Weeks    Status  Partially Met    Target Date  08/04/18      PT LONG TERM GOAL  #14   TITLE  Patient will increase 10 meter walk test to <30 seconds as to improve gait speed for better community ambulation and to reduce fall risk.    Baseline  3/3: 57 seconds with RW and CGA    Time  8    Period  Weeks    Status  New    Target Date  08/04/18            Plan - 06/25/18 1445    Clinical Impression Statement  Continued with plan of care as laid out in evaluation and additional prior episodes. Pt able to perform session in full with intermittent rest breaks offered PRN. Pt requires verbal and tactile cues intermittently for exercise instruction and correct form, as well as min-modA physical assist at times.  Began session with supine stretching to facilitate upright postural ROM and strength. Progressed toward AMB thereafter. Pt able to progress to bridging and supine marching this date, a big sign of progression in strength and mobility. Today also marks the very first time patient has been able to stand in tall, neutral posture without UE support and maintain balance, which is a very big sign of progress made over the past several weeks.    Rehab Potential  Fair    Clinical Impairments Affecting Rehab Potential  hx of HTN, HLD, CVA, learning disability, Diabetes, brain tumor,     PT Frequency  2x / week    PT Duration  8  weeks    PT Treatment/Interventions  ADLs/Self Care Home Management;Aquatic Therapy;Ultrasound;Moist Heat;Traction;DME Instruction;Gait training;Stair training;Functional mobility training;Therapeutic activities;Therapeutic exercise;Orthotic Fit/Training;Neuromuscular re-education;Balance training;Patient/family education;Manual techniques;Wheelchair mobility training;Passive range of motion;Energy conservation;Taping;Visual/perceptual remediation/compensation    PT Next Visit Plan  progress BLE strengthening, work on bed mobility/rolling, posture    PT Home Exercise Plan  TrA contraction, glute sets: needs updates. Potentially some time in supine flat in hospital bed, to improve hip extension deficits.     Consulted and Agree with Plan of Care  Patient       Patient will benefit from skilled therapeutic intervention in order to improve the following deficits and impairments:  Abnormal gait, Decreased activity tolerance, Decreased balance, Decreased knowledge of precautions, Decreased endurance, Decreased coordination, Decreased knowledge of use of DME, Decreased mobility, Decreased range of motion, Difficulty walking, Decreased safety awareness, Decreased strength, Impaired flexibility, Impaired perceived functional ability, Impaired tone, Postural dysfunction, Improper body mechanics, Pain  Visit Diagnosis: Muscle weakness (generalized)  Other lack of coordination  Other abnormalities of gait and mobility  Hemiplegia and hemiparesis following cerebral infarction affecting right dominant side [facility])     Problem List Patient Active Problem List   Diagnosis Date Noted  . Acute CVA (cerebrovascular accident) ([place]) 05/02/2016  . Left-sided weakness 05/01/2016   3:49 PM, 06/25/18 Etta Grandchild, PT, DPT Physical Therapist - [place] [phone]     Etta Grandchild 06/25/2018, 3:46 PM  [place] MAIN [facility] SERVICES [address] [place], [place], [address] Phone: [phone]   Fax:  [phone]  Name: AUGUSTA HILBERT MRN: [id number] Date of Birth: 01/09/1966

## 2018-06-25 NOTE — Therapy (Signed)
[place] MAIN [facility] SERVICES [address] [place], [place], [address] Phone: [phone]   Fax:  [phone]  Occupational Therapy Treatment  Patient Details  Name: Curtis Powell MRN: [id number] Date of Birth: 1966-03-16 No data recorded  Encounter Date: 06/25/2018  OT End of Session - 06/25/18 1350    Visit Number  95    Number of Visits  113    Date for OT Re-Evaluation  07/16/18    Authorization Type  Progress reporting period starting 04/23/2018    OT Start Time  1345    OT Stop Time  1430    OT Time Calculation (min)  45 min    Equipment Utilized During Treatment  Long handled shoe horn    Activity Tolerance  Patient tolerated treatment well    Behavior During Therapy  WFL for tasks assessed/performed       Past Medical History:  Diagnosis Date  . GERD (gastroesophageal reflux disease)   . Hyperlipidemia   . Hypertension   . Obesity   . Stroke [facility])     Past Surgical History:  Procedure Laterality Date  . BRAIN SURGERY      There were no vitals filed for this visit.  Subjective Assessment - 06/25/18 1350    Subjective   Pt. reports that his knees were sore on Tuesday.    Pertinent History  Pt. is a 53 y.o. male who suffered a CVA on 05/01/2016. Pt. was admitted to the hospital. Once discharged, he received Home Health PT and OT services for about a month. Pt. has had multiple CVAs over the past 8 years, and has had multiple falls in the past 6 months. Pt. resides in an apartment. Pt. has caregivers for 80 hours. Pt.'s mother stays with pt. at night, and assists with IADL tasks.      Patient Stated Goals  To be able to throw a ball and dribble a ball, do as much as I can for myself.     Currently in Pain?  No/denies       OT TREATMENT    Neuromuscular Re-education:  Pt. worked on reaching with his RUE, and hand for for flat shapes using the shape tower. Pt. was able to to move the shapes over the first 3 vertical  rungs on the shape tower. Pt. required increased time to complete, and rest breaks between each set.  Selfcare:  Pt. worked on Set designer. Pt. worked on repositioning a long sleeve shirt onto the [employer]. Pt. education was provided about work simplification strategies                        OT Education - 06/25/18 1350    Education provided  Yes    Education Details  RUE Functioning    Person(s) Educated  Patient    Comprehension  Verbalized understanding;Returned demonstration;Verbal cues required       OT Short Term Goals - 03/03/17 1459      OT SHORT TERM GOAL #1   Title  `        OT Long Term Goals - 05/25/18 1344      OT LONG TERM GOAL #4   Title  Pt. will perform self dressing with minA and A/E as needed.    Baseline  Pt. continues to improve LB dressing in standing. Pt.  continues to require increased time and mod A donn socks, and  pants over his feet. CGA standing to hike clothing. Pt. requires min A donning a jacket, and Independent doning a shirt.    Time  12    Period  Weeks    Status  On-going    Target Date  07/16/18      OT LONG TERM GOAL #6   Title  Pt. will write his name efficiently with 100% legibility    Baseline  04/23/2018: Pt continues to present with limited legibility and efficiency when writing name to sign in when riding the [place]    Time  12    Period  Weeks    Status  On-going    Target Date  07/16/18      OT LONG TERM GOAL #7   Title  Pt will independently and consistently follow HEP to increase UE strength to increase functional independence    Baseline  Pt to continues to work towards independence with exercises    Time  12    Period  Weeks    Status  On-going    Target Date  07/16/18      OT LONG TERM GOAL #8   Title  Pt. will require supervision ironing a shirt.    Baseline  Pt. requires minA, and complete set-up seated using a tabletop iron    Time  12    Period  Weeks     Status  New    Target Date  07/16/18      OT LONG TERM GOAL  #9   Baseline  Pt. will independently be able to sew a button onto a shirt.    Time  12    Period  Weeks    Status  On-going    Target Date  07/16/18      OT LONG TERM GOAL  #10   TITLE  Pt. will independently be able to throw a ball    Baseline  04/23/2018: Pt. is unable.    Time  12    Period  Weeks    Status  On-going      OT LONG TERM GOAL  #11   TITLE  Pt. will improve UE functional reaching to be able to independently use his RUE to hand his clothes in the closest.    Baseline   Pt. continues to have difficulty hanging his clothes up in his closet.    Time  12    Period  Weeks    Status  On-going    Target Date  07/16/18            Plan - 06/25/18 1350    Clinical Impression Statement  Pt. reporrts having had knee pain on Tuesday, so was unable to come to therapy. Pt. continues to work on improving RUE functional reaching, and [facility] skills needed for writing legibly, and efficiently, reaching into cabinets, performing ironing, and sewing a button. on clothing.    Occupational performance deficits (Please refer to evaluation for details):  ADL's;IADL's    Clinical Decision Making  Several treatment options, min-mod task modification necessary    OT Frequency  2x / week    OT Duration  12 weeks    OT Treatment/Interventions  Self-care/ADL training;Patient/family education;DME and/or AE instruction;Therapeutic exercise;Moist Heat;Neuromuscular education    Consulted and Agree with Plan of Care  Patient       Patient will benefit from skilled therapeutic intervention in order to improve the following deficits and impairments:     Visit Diagnosis: Muscle weakness (  generalized)  Other lack of coordination    Problem List Patient Active Problem List   Diagnosis Date Noted  . Acute CVA (cerebrovascular accident) ([place]) 05/02/2016  . Left-sided weakness 05/01/2016    Harrel Carina, MS,  OTR/L 06/25/2018, 2:05 PM  [place] MAIN [facility] SERVICES [address] [place], [place], [address] Phone: [phone]   Fax:  [phone]  Name: Curtis Powell MRN: [id number] Date of Birth: 1966/01/30

## 2018-06-29 ENCOUNTER — Encounter: Payer: Self-pay | Admitting: Occupational Therapy

## 2018-06-29 NOTE — Therapy (Signed)
[place] MAIN [facility] SERVICES [address] [place], [place], [address] Phone: [phone]   Fax:  [phone]  Patient Details  Name: Curtis Powell MRN: [id number] Date of Birth: 1965-04-11 Referring Provider:  No ref. provider found  Encounter Date: 06/29/2018  Reached out to the pt. via the telephone to check in on the pt., inform them that the clinic is closed due to the pandemic, and answer questions. Spoke with the pt.'s mother.   Harrel Carina, MS, OTR/L 06/29/2018, 1:29 PM  [place] MAIN [facility] SERVICES [address] [place], [place], [address] Phone: [phone]   Fax:  [phone]

## 2018-06-30 ENCOUNTER — Ambulatory Visit: Payer: Medicare HMO

## 2018-06-30 ENCOUNTER — Ambulatory Visit: Payer: Medicare HMO | Admitting: Occupational Therapy

## 2018-06-30 NOTE — Therapy (Signed)
[place] MAIN [facility] SERVICES [address] [place], [place], [address] Phone: [phone]   Fax:  [phone]  Patient Details  Name: Curtis Powell MRN: [id number] Date of Birth: September 23, 1965 Referring Provider:  No ref. provider found  Encounter Date: 06/30/2018  DPT called patient to let patient know the status of the clinic and ensure that we will be reaching out to schedule when we re-open. DPT fielded any questions patient had at this time and offered guidance on current home program as necessary. [name] answered call and had no further questions at this time, and DPT advised that should questions arise patient can reach out to the clinic for guidance via phone or DPT email.  Janna Arch, PT, DPT   06/30/2018, 9:06 AM  [place] MAIN [facility] SERVICES [address] [place], [place], [address] Phone: [phone]   Fax:  [phone]

## 2018-07-02 ENCOUNTER — Encounter: Payer: Medicare HMO | Admitting: Occupational Therapy

## 2018-07-02 ENCOUNTER — Ambulatory Visit: Payer: Medicare HMO

## 2018-07-07 ENCOUNTER — Ambulatory Visit: Payer: Medicare HMO

## 2018-07-07 ENCOUNTER — Encounter: Payer: Medicare HMO | Admitting: Occupational Therapy

## 2018-07-09 ENCOUNTER — Encounter: Payer: Medicare HMO | Admitting: Occupational Therapy

## 2018-07-09 ENCOUNTER — Ambulatory Visit: Payer: Medicare HMO

## 2018-07-13 ENCOUNTER — Ambulatory Visit
Admission: RE | Admit: 2018-07-13 | Payer: Medicare HMO | Source: Home / Self Care | Admitting: Unknown Physician Specialty

## 2018-07-13 ENCOUNTER — Encounter: Admission: RE | Payer: Self-pay | Source: Home / Self Care

## 2018-07-13 SURGERY — COLONOSCOPY WITH PROPOFOL
Anesthesia: General

## 2018-07-14 ENCOUNTER — Encounter: Payer: Medicare HMO | Admitting: Occupational Therapy

## 2018-07-14 ENCOUNTER — Ambulatory Visit: Payer: Medicare HMO

## 2018-07-16 ENCOUNTER — Encounter: Payer: Medicare HMO | Admitting: Occupational Therapy

## 2018-07-16 ENCOUNTER — Ambulatory Visit: Payer: Medicare HMO | Admitting: Physical Therapy

## 2018-07-16 NOTE — Therapy (Signed)
[place] MAIN [facility] SERVICES [address] [place], [place], [address] Phone: [phone]   Fax:  [phone]  Patient Details  Name: Curtis Powell MRN: [id number] Date of Birth: Sep 07, 1965 Referring Provider:  No ref. provider found  Encounter Date: 07/16/2018  The [facility] outpatient clinics are closed at this time due to the COVID-19 epidemic. Upon consideration of patient's status and needs, a home health referral is in order to help provide continued rehabilitation services care for the patient. Attempted to call patient unsuccessful as mailbox is not set up at this time.   Janna Arch, PT, DPT   07/16/2018, 2:08 PM  [place] MAIN [facility] SERVICES [address] [place], [place], [address] Phone: [phone]   Fax:  [phone]

## 2018-07-17 ENCOUNTER — Encounter: Payer: Self-pay | Admitting: Occupational Therapy

## 2018-07-17 NOTE — Therapy (Signed)
[place] MAIN [facility] SERVICES [address] [place], [place], [address] Phone: [phone]   Fax:  [phone]  Patient Details  Name: Curtis Powell MRN: [id number] Date of Birth: [birth year] Referring Provider:  No ref. provider found  Encounter Date: 07/17/2018   Dr. Kym Groom,   Curtis Powell was referred for outpatient  Occupational Therapy services.  Due to COVID-19, it will be more appropriate at this time to refer for home health therapy services.  The patient is high risk, and would benefit from therapy in the home until outpatient until the outpatient clinic has opened.  Thank you,  Harrel Carina, MS, OTR/L  Harrel Carina 07/17/2018, 2:34 PM  [place] MAIN [facility] SERVICES [address] [place], [place], [address] Phone: [phone]   Fax:  [phone]

## 2018-07-17 NOTE — Therapy (Unsigned)
[place] MAIN [facility] SERVICES [address] [place], [place], [address] Phone: [phone]   Fax:  [phone]  Patient Details  Name: Curtis Powell MRN: [id number] Date of Birth: 08-Jul-1965 Referring Provider:  No ref. provider found  Encounter Date: 07/17/2018  The [facility] outpatient clinics are closed at this time due to the COVID-19 epidemic. Upon consideration of patient's status and needs, the referring physician is being contacted to request a home health referral in order to help provide continued rehabilitation services care for the patient.   Harrel Carina, MS, OTR/L 07/17/2018, 2:27 PM  [place] MAIN [facility] SERVICES [address] [place], [place], [address] Phone: [phone]   Fax:  [phone]

## 2018-07-17 NOTE — Therapy (Signed)
[place] MAIN [facility] SERVICES [address] [place], [place], [address] Phone: [phone]   Fax:  [phone]  Patient Details  Name: Curtis Powell MRN: [id number] Date of Birth: 07-26-1965 Referring Provider:  No ref. provider found  Encounter Date: 07/17/2018    The [facility] outpatient clinics are closed at this time due to the COVID-19 epidemic. Upon consideration of patient's status and needs, the referring physician is being contacted to request a home health referral in order to help provide continued rehabilitation services care for the patient.   Harrel Carina, MS, OTR/L 07/17/2018, 2:33 PM  [place] MAIN [facility] SERVICES [address] [place], [place], [address] Phone: [phone]   Fax:  [phone]

## 2018-07-17 NOTE — Therapy (Deleted)
[place] MAIN [facility] SERVICES [address] [place], [place], [address] Phone: [phone]   Fax:  [phone]  Occupational Therapy Treatment  Patient Details  Name: Curtis Powell MRN: [id number] Date of Birth: [birth year] No data recorded  Encounter Date: 07/17/2018    Past Medical History:  Diagnosis Date  . GERD (gastroesophageal reflux disease)   . Hyperlipidemia   . Hypertension   . Obesity   . Stroke [facility])     Past Surgical History:  Procedure Laterality Date  . BRAIN SURGERY      There were no vitals filed for this visit.                          OT Short Term Goals - 03/03/17 1459      OT SHORT TERM GOAL #1   Title  `        OT Long Term Goals - 05/25/18 1344      OT LONG TERM GOAL #4   Title  Pt. will perform self dressing with minA and A/E as needed.    Baseline  Pt. continues to improve LB dressing in standing. Pt.  continues to require increased time and mod A donn socks, and pants over his feet. CGA standing to hike clothing. Pt. requires min A donning a jacket, and Independent doning a shirt.    Time  12    Period  Weeks    Status  On-going    Target Date  07/16/18      OT LONG TERM GOAL #6   Title  Pt. will write his name efficiently with 100% legibility    Baseline  04/23/2018: Pt continues to present with limited legibility and efficiency when writing name to sign in when riding the [place]    Time  12    Period  Weeks    Status  On-going    Target Date  07/16/18      OT LONG TERM GOAL #7   Title  Pt will independently and consistently follow HEP to increase UE strength to increase functional independence    Baseline  Pt to continues to work towards independence with exercises    Time  12    Period  Weeks    Status  On-going    Target Date  07/16/18      OT LONG TERM GOAL #8   Title  Pt. will require supervision ironing a shirt.    Baseline  Pt. requires minA, and complete  set-up seated using a tabletop iron    Time  12    Period  Weeks    Status  New    Target Date  07/16/18      OT LONG TERM GOAL  #9   Baseline  Pt. will independently be able to sew a button onto a shirt.    Time  12    Period  Weeks    Status  On-going    Target Date  07/16/18      OT LONG TERM GOAL  #10   TITLE  Pt. will independently be able to throw a ball    Baseline  04/23/2018: Pt. is unable.    Time  12    Period  Weeks    Status  On-going      OT LONG TERM GOAL  #11   TITLE  Pt. will improve UE functional reaching to be able to independently use  his RUE to hand his clothes in the closest.    Baseline   Pt. continues to have difficulty hanging his clothes up in his closet.    Time  12    Period  Weeks    Status  On-going    Target Date  07/16/18              Patient will benefit from skilled therapeutic intervention in order to improve the following deficits and impairments:     Visit Diagnosis: No diagnosis found.    Problem List Patient Active Problem List   Diagnosis Date Noted  . Acute CVA (cerebrovascular accident) ([place]) 05/02/2016  . Left-sided weakness 05/01/2016    Harrel Carina 07/17/2018, 2:32 PM  [place] MAIN [facility] SERVICES [address] [place], [place], [address] Phone: [phone]   Fax:  [phone]  Name: SAED HUDLOW MRN: [id number] Date of Birth: May 03, 1965

## 2018-07-17 NOTE — Therapy (Unsigned)
[place] MAIN [facility] SERVICES [address] [place], [place], [address] Phone: [phone]   Fax:  [phone]  Patient Details  Name: Curtis Powell MRN: [id number] Date of Birth: Feb 23, 1966 Referring Provider:  No ref. provider found  Encounter Date: 07/17/2018  * *** was referred for outpatient ***  therapy services.  Due to COVID-19, it will be more appropriate at this time to refer for home health therapy services.  The patient *** (is high risk, did not wish to schedule in an outpatient setting, etc).   Thank you, ***  ***  Harrel Carina, MS,OTR/L 07/17/2018, 2:28 PM  [place] MAIN [facility] SERVICES [address] [place], [place], [address] Phone: [phone]   Fax:  [phone]

## 2018-07-21 ENCOUNTER — Encounter: Payer: Medicare HMO | Admitting: Occupational Therapy

## 2018-07-21 ENCOUNTER — Ambulatory Visit: Payer: Medicare HMO

## 2018-07-23 ENCOUNTER — Encounter: Payer: Medicare HMO | Admitting: Occupational Therapy

## 2018-07-23 ENCOUNTER — Ambulatory Visit: Payer: Medicare HMO

## 2018-07-28 ENCOUNTER — Encounter: Payer: Medicare HMO | Admitting: Occupational Therapy

## 2018-07-28 ENCOUNTER — Ambulatory Visit: Payer: Medicare HMO

## 2018-07-30 ENCOUNTER — Ambulatory Visit: Payer: Medicare HMO

## 2018-07-30 ENCOUNTER — Encounter: Payer: Medicare HMO | Admitting: Occupational Therapy

## 2018-08-04 ENCOUNTER — Ambulatory Visit: Payer: Medicare HMO

## 2018-08-04 ENCOUNTER — Encounter: Payer: Medicare HMO | Admitting: Occupational Therapy

## 2018-08-06 ENCOUNTER — Encounter: Payer: Medicare HMO | Admitting: Occupational Therapy

## 2018-08-06 ENCOUNTER — Ambulatory Visit: Payer: Medicare HMO

## 2018-08-25 ENCOUNTER — Encounter: Payer: Self-pay | Admitting: Occupational Therapy

## 2018-08-25 ENCOUNTER — Ambulatory Visit: Payer: Medicare HMO | Attending: Family Medicine | Admitting: Occupational Therapy

## 2018-08-25 ENCOUNTER — Ambulatory Visit: Payer: Medicare HMO

## 2018-08-25 ENCOUNTER — Other Ambulatory Visit: Payer: Self-pay

## 2018-08-25 DIAGNOSIS — R2689 Other abnormalities of gait and mobility: Secondary | ICD-10-CM

## 2018-08-25 DIAGNOSIS — M6281 Muscle weakness (generalized): Secondary | ICD-10-CM | POA: Insufficient documentation

## 2018-08-25 DIAGNOSIS — R278 Other lack of coordination: Secondary | ICD-10-CM | POA: Insufficient documentation

## 2018-08-25 NOTE — Therapy (Signed)
[place] MAIN [facility] SERVICES [address] [place], [place], [address] Phone: [phone]   Fax:  [phone]  Occupational Therapy Treatment/Recertification Note  Patient Details  Name: Curtis Powell MRN: [id number] Date of Birth: 1966-03-04 No data recorded  Encounter Date: 08/25/2018  OT End of Session - 08/25/18 1624    Visit Number  96    Number of Visits  113    Date for OT Re-Evaluation  07/16/18    Authorization Type  Progress reporting period starting 04/23/2018    OT Start Time  1620    OT Stop Time  1705    OT Time Calculation (min)  45 min    Equipment Utilized During Treatment  Long handled shoe horn    Activity Tolerance  Patient tolerated treatment well    Behavior During Therapy  WFL for tasks assessed/performed       Past Medical History:  Diagnosis Date  . GERD (gastroesophageal reflux disease)   . Hyperlipidemia   . Hypertension   . Obesity   . Stroke [facility])     Past Surgical History:  Procedure Laterality Date  . BRAIN SURGERY      There were no vitals filed for this visit.  Subjective Assessment - 08/25/18 1623    Subjective   Pt. has moved into a new home.    Pertinent History  Pt. is a 53 y.o. male who suffered a CVA on 05/01/2016. Pt. was admitted to the hospital. Once discharged, he received Home Health PT and OT services for about a month. Pt. has had multiple CVAs over the past 8 years, and has had multiple falls in the past 6 months. Pt. resides in an apartment. Pt. has caregivers for 80 hours. Pt.'s mother stays with pt. at night, and assists with IADL tasks.      Patient Stated Goals  To be able to throw a ball and dribble a ball, do as much as I Powell for myself.     Currently in Pain?  No/denies         [facility] OT Assessment - 08/25/18 1631      Coordination   Right 9 Hole Peg Test  42    Left 9 Hole Peg Test  51      AROM   Overall AROM Comments  Shoulder flexion 145, abduction: 95      Hand  Function   Right Hand Grip (lbs)  35    Right Hand Lateral Pinch  20 lbs    Right Hand 3 Point Pinch  12 lbs    Left Hand Grip (lbs)  40    Left Hand Lateral Pinch  15 lbs    Left 3 point pinch  12 lbs      Measurements were obtained, and goals were reviewed with the pt.   Self-care:  Pt. performed toileting with CGA including hygiene, hiking pants, and clothing negotiation skills.               OT Education - 08/25/18 1624    Education provided  Yes    Education Details  RUE Functioning    Person(s) Educated  Patient    Methods  Explanation;Demonstration    Comprehension  Verbalized understanding;Returned demonstration;Verbal cues required       OT Short Term Goals - 03/03/17 1459      OT SHORT TERM GOAL #1   Title  `        OT Long Term  Goals - 08/25/18 1657      OT LONG TERM GOAL #4   Title  Pt. will perform self dressing with minA and A/E as needed.    Baseline  08/25/2018: Pt. continues to improve LB dressing in standing. Pt.  continues to require increased time and mod A donn socks, and pants over his feet. CGA standing to hike clothing. Pt. requires min A donning a jacket, and Independent doning a shirt.    Time  12    Period  Weeks    Status  On-going    Target Date  11/17/18      OT LONG TERM GOAL #6   Title  Pt. will write his name efficiently with 100% legibility    Baseline  08/25/2018: Pt continues to present with limited legibility and efficiency when writing name to sign in when riding the [place]    Time  12    Period  Weeks    Status  On-going    Target Date  11/17/18      OT LONG TERM GOAL #7   Title  Pt will independently and consistently follow HEP to increase UE strength to increase functional independence    Baseline  08/25/2018: Pt to continues to work towards independence with exercises    Time  12    Period  Weeks    Status  On-going    Target Date  11/17/18      OT LONG TERM GOAL #8   Title  Pt. will require supervision  ironing a shirt.    Baseline  08/25/2018: Pt. requires minA, and complete set-up seated using a tabletop iron    Time  12    Period  Weeks    Status  On-going    Target Date  11/17/18      OT LONG TERM GOAL  #9   Baseline  Pt. will independently be able to sew a button onto a shirt.    Time  12    Period  Weeks    Status  On-going    Target Date  11/17/18      OT LONG TERM GOAL  #10   TITLE  Pt. will independently be able to throw a ball    Baseline  08/25/2018: Pt. is unable.    Time  12    Period  Weeks    Status  On-going    Target Date  11/17/18      OT LONG TERM GOAL  #11   TITLE  Pt. will improve UE functional reaching to be able to independently use his RUE to hand his clothes in the closest.    Baseline   08/25/2018: Pt. continues to have difficulty hanging his clothes up in his closet.    Time  12    Period  Weeks    Status  On-going    Target Date  05/27/18            Plan - 08/25/18 1625    Clinical Impression Statement  Pt. has moved into a larger house with larger doorways. Pt. does not have any stairs that he needs to traverse. Pt. reports the doorways into the bathroom are more accessible., with easier access to the shower sysytem. Pt. has improved with RUE ROM, and [place], however has had a declrease in bilateral grip strength, and pinch strength. Pt. continues to work on improviing UE strength, and [facility] skills iin order to improve engagement in ADL, and IAD functioning.  Occupational performance deficits (Please refer to evaluation for details):  ADL's;IADL's    Rehab Potential  Fair    Clinical Decision Making  Several treatment options, min-mod task modification necessary    OT Frequency  2x / week    OT Duration  12 weeks    OT Treatment/Interventions  Self-care/ADL training;Patient/family education;DME and/or AE instruction;Therapeutic exercise;Moist Heat;Neuromuscular education    Consulted and Agree with Plan of Care  Patient       Patient will  benefit from skilled therapeutic intervention in order to improve the following deficits and impairments:     Visit Diagnosis: Muscle weakness (generalized)  Other lack of coordination    Problem List Patient Active Problem List   Diagnosis Date Noted  . Acute CVA (cerebrovascular accident) ([place]) 05/02/2016  . Left-sided weakness 05/01/2016    Harrel Carina, MS, OTR/L 08/25/2018, 5:23 PM  [place] MAIN [facility] SERVICES [address] [place], [place], [address] Phone: [phone]   Fax:  [phone]  Name: Curtis Powell MRN: [id number] Date of Birth: 04-07-1966

## 2018-08-25 NOTE — Therapy (Signed)
[place] MAIN [facility] SERVICES [address] [place], [place], [address] Phone: [phone]   Fax:  [phone]  Physical Therapy Progress Note/Recertification   Dates of reporting period  05/19/18   to   08/25/18  Patient Details  Name: Curtis Powell MRN: [id number] Date of Birth: Oct 05, 1965 No data recorded  Encounter Date: 08/25/2018  PT End of Session - 08/25/18 1527    Visit Number  110    Number of Visits  137    Date for PT Re-Evaluation  10/20/18    Authorization Type  last goals: 06/09/18, updated today 08/25/18    Authorization Time Period  authorized 05/12/18-11/10/18    PT Start Time  1530    PT Stop Time  1615    PT Time Calculation (min)  45 min    Equipment Utilized During Treatment  Gait belt    Activity Tolerance  Patient tolerated treatment well;No increased pain    Behavior During Therapy  WFL for tasks assessed/performed       Past Medical History:  Diagnosis Date  . GERD (gastroesophageal reflux disease)   . Hyperlipidemia   . Hypertension   . Obesity   . Stroke [facility])     Past Surgical History:  Procedure Laterality Date  . BRAIN SURGERY      There were no vitals filed for this visit.  Subjective Assessment - 08/25/18 1527    Subjective  Pt reports that he is doing well today. He states that he moved into a new house since the last time he came in for therapy and it is easier to navigate. He has continued to perform his HEP over the time when he was unable to come in for therapy. He did not have any formal HH PT. No specific questions or concerns upon arrival.     Pertinent History   Patient is a pleasant 53 year old male who presents to physical therapy for weakness and immobility secondary to CVA.  Had a stroke in Feb 2018. He was previously fully independent, but this stroke caused severe residual deficits, mainly on the right side as well as speech, and now he is unable to walk or perform most of his ADLs on his  own. Entire right side is very weak. He still has a little difficulty with speech but his swallowing is improved to baseline. His mom had to move in with him and now is his main caregiver.     Currently in Pain?  No/denies         [facility] PT Assessment - 08/25/18 1552      Standardized Balance Assessment   Standardized Balance Assessment  Berg Balance Test      Berg Balance Test   Sit to Stand  Able to stand  independently using hands    Standing Unsupported  Unable to stand 30 seconds unassisted    Sitting with Back Unsupported but Feet Supported on Floor or Stool  Able to sit safely and securely 2 minutes    Stand to Sit  Uses backs of legs against chair to control descent    Transfers  Able to transfer with verbal cueing and /or supervision    Standing Unsupported with Eyes Closed  Needs help to keep from falling    Standing Unsupported with Feet Together  Needs help to attain position and unable to hold for 15 seconds    From Standing, Reach Forward with Outstretched Arm  Loses balance while trying/requires  external support    From Standing Position, Pick up Object from Floor  Unable to try/needs assist to keep balance    From Standing Position, Turn to Look Behind Over each Shoulder  Needs assist to keep from losing balance and falling    Turn 360 Degrees  Needs assistance while turning    Standing Unsupported, Alternately Place Feet on Step/Stool  Needs assistance to keep from falling or unable to try    Standing Unsupported, One Foot in [employer] balance while stepping or standing    Standing on One Leg  Unable to try or needs assist to prevent fall    Total Score  11            TREATMENT   Ther-ex  Updated outcome measures and goals with patient including: 5TSTS: 20.6s from [facility], one hand on walker and one on WC, 80% upright posture 34mgait speed: 1:41.4s= 0.10 m/s BERG: 11/56 Forward/backward ambulation in // bars with UE support x 2 lengths each with seated rest  break in between sets; Standing mini squats in // bars with UE support x 15; Standing heel/toe raises in // bars with UE support x 10 each; Standing marches in // bars with UE support x 10 each Pt completed LEFS with assist from therapist due to difficulty reading and pt scored 28/80   Pt educated throughout session about proper posture and technique with exercises. Improved exercise technique, movement at target joints, use of target muscles after min to mod verbal, visual, tactile cues.    Repeated outcome measures with patient and he shows significant decline since they were last performed. His BERG has decreased considerably to an 11/56 and his 119mait speed is significantly slower. His LEFS also decreased to 28/80. Pt is unable to remain standing in parallel bars today without continual UE support. If pt tries to balance without support from his hands he falls over backwards within approximately 3-4 seconds. During session today continued with plan of care as laid out in evaluation and additional prior episodes. Pt able to perform session in full with intermittent rest breaks offered PRN. Pt encouraged to continue his HEP. Pt will benefit from PT services to address deficits in strength, balance, and mobility in order to return to full function at home.                 PT Short Term Goals - 08/25/18 1528      PT SHORT TERM GOAL #1   Title  Patient will be independent in home exercise program to improve strength/mobility for better functional independence with ADLs.    Baseline  hep compliant    Time  2    Period  Weeks    Status  Achieved      PT SHORT TERM GOAL #2   Title  Patient will require min cueing for STS transfer with CGA for increased independence with mobility.     Baseline  CGA    Time  2    Period  Weeks    Status  Achieved      PT SHORT TERM GOAL #3   Title  Patient will maintain upright posture for > 15 seconds to demonstrate strengthened postural  control muscualture     Baseline  18 seconds     Time  2    Period  Weeks    Status  Achieved        PT Long Term Goals - 08/25/18 1537  PT LONG TERM GOAL #1   Title  Patient will perform bed mobility with Supervision to increase independent mobility    Baseline  able to perform mod I for increased time , occasional Min A for scooting up in bed; 7/16: min guard for safety with increased effort and time, cues to scoot back in bed before sit>supine 9/12: can do it independently but with difficulty    Time  8    Period  Weeks    Status  Achieved      PT LONG TERM GOAL #2   Title  Patient (< 21 years old) will complete five times sit to stand test in < 10 seconds indicating an increased LE strength and improved balance.    Baseline  2/18: 30 seconds from plinth chair height single UE on seat and single on walker ; 32 seconds from plinth with single UE on seat and single on walker. 4/10: 26 seconds from [facility] with one hand on walker5/29: 22.2 seconds with one hand on walker one hand on WC; 7/16: one hand on walker one hand on WC 18.96 seconds; pt achieving 75% of full upright standing 8/22:one hand on walker one hand on WC 18 seconds; pt achieving 75% of full upright standing  9/12: 18 seconds econds; pt achieving 75% of full upright standing 8/22:one hand on walker one hand on WC 10/15: 17seconds; pt achieving 75% of full upright posture 11/7: 16 seconds; pt achieving 75 upright posture 12/19: 15 seconds 80% upright posture 3/3: 18 seconds 80% upright posture. 5/19: 20.6s from [facility], one hand on walker and one on WC, 80% upright posture    Time  8    Period  Weeks    Status  Partially Met    Target Date  10/20/18      PT LONG TERM GOAL #3   Title  Patient will ambulate 40 ft with walker with no breaks to allow for increased mobility within home.     Baseline  12/26: 29 ft with walker and WC follow with no rest breaks; 2/18: multiple trials previous session of >100 ft     Time  8    Period   Weeks    Status  Achieved      PT LONG TERM GOAL #4   Title  Patient will increase BLE gross strength to 4+/5 as to improve functional strength for independent gait, increased standing tolerance and increased ADL ability.    Baseline   4-/5 RLE, 10/15: 4/5 RLE 11/7: 4/5 RLE 3/3: 4/5     Time  8    Period  Weeks    Status  Deferred    Target Date  10/20/18      PT LONG TERM GOAL #5   Title  Patient will increase lower extremity functional scale to >40/80 to demonstrate improved functional mobility and increased tolerance with ADLs.     Baseline  14/80; 12/26: 21/80; 2/18: 28/80  4/10; 25/80 5/29: 27/80; 7/16: 29/80 8/22: 30/80  9/12: 26/80 (feeling more tired due to moving) 10/15: 37/80 11/7: 23/80 3/3: 32/80; 08/25/18: 28/80    Time  8    Period  Weeks    Status  Partially Met    Target Date  10/20/18      Additional Long Term Goals   Additional Long Term Goals  Yes      PT LONG TERM GOAL #6   Title  Patient will walk through a door involving one small step up/down to  increase functional independence.     Baseline  require CGA  BUE support in // bars at this time 10/15: required BUE and CGA in //bars; 08/25/18: Unsafe to attempt at this time    Time  8    Period  Weeks    Status  Deferred    Target Date  10/20/18      PT LONG TERM GOAL #7   Title  Patient will ambulate 40 ft with least assistive device and Supervision with no breaks to allow for increased mobility within home.     Baseline  requires use of walker and CGA     Time  8    Period  Weeks    Status  Achieved      PT LONG TERM GOAL #8   Title  Patient will perform 10 MWT in < 1 minute to demonstrate improved ambulatory velocity and capacity.     Baseline  4/10: 2 minutes 45 seconds; 5/29: 2 minute 31 seconds; 3 minutes 22 seconds 8/22: 3 minutes 2 seconds  9/12:  2 minutes and 43 seconds 10/15: 3mnute 12 seconds 11/7: 1 minute 21 seconds 12/19: 1 min 16 seconds 3/3: 57 seconds; 08/26/18: 1:41.4s= 0.10 m/s    Time  8     Period  Weeks    Status  Partially Met    Target Date  10/20/18      PT LONG TERM GOAL  #9   TITLE  Patient will increase Berg Balance score by > 6 points (26/56) to demonstrate decreased fall risk during functional activities.    Baseline  9/12: 20/56 10/15: 21/56 11/7: 20/56 12/19: 22/56; 08/26/18: 11/56    Time  8    Period  Weeks    Status  Partially Met    Target Date  10/20/18      PT LONG TERM GOAL  #10   TITLE  Patient (< 665years old) will complete five times sit to stand test with full upright posture in < 30 seconds indicating an increased glute activation, LE strength and improved balance.    Baseline  10/15: 37seconds 11/7: 20 seconds     Time  8    Period  Weeks    Status  Achieved      PT LONG TERM GOAL  #11   TITLE  Patient will ambulate 100 ft with least assistive device and Supervision with no breaks to allow for increased mobility within home.     Baseline  10/17: 816f11/7: 10010f  Time  8    Period  Weeks    Status  Achieved      PT LONG TERM GOAL  #12   TITLE  Patient (< 60 58ars old) will complete five times sit to stand test with full upright posture in < 15 seconds indicating increased glute activation, LE strength and improved balance.    Baseline  11/7: 20 seconds 12/19: 18 seconds 3/3: 18 seconds; 08/25/18: 20.6s from WC,Lydiane hand on walker and one on WC, 80% upright posture    Time  8    Period  Weeks    Status  Partially Met    Target Date  10/20/18      PT LONG TERM GOAL  #13   TITLE  Patient will ambulate 150 ft with least assistive device and Supervision with no breaks to allow for increased mobility within home.     Baseline  11/7: 100f36f/19: 115 ft  3/3: will  assess next session, has done 200 ft with one rest break; 08/26/18: Pt fatigued after 30' today    Time  8    Period  Weeks    Status  Partially Met    Target Date  10/20/18      PT LONG TERM GOAL  #14   TITLE  Patient will decrease 10 meter walk test to <30 seconds as to improve  gait speed for better community ambulation and to reduce fall risk.    Baseline  3/3: 57 seconds with RW and CGA; 08/26/18: 1:41    Time  8    Period  Weeks    Status  On-going    Target Date  10/20/18            Plan - 08/25/18 1528    Clinical Impression Statement  Repeated outcome measures with patient and he shows significant decline since they were last performed. His BERG has decreased considerably to an 11/56 and his 9mgait speed is significantly slower. His LEFS also decreased to 28/80. Pt is unable to remain standing in parallel bars today without continual UE support. If pt tries to balance without support from his hands he falls over backwards within approximately 3-4 seconds. During session today continued with plan of care as laid out in evaluation and additional prior episodes. Pt able to perform session in full with intermittent rest breaks offered PRN. Pt encouraged to continue his HEP. Pt will benefit from PT services to address deficits in strength, balance, and mobility in order to return to full function at home.     Rehab Potential  Fair    Clinical Impairments Affecting Rehab Potential  hx of HTN, HLD, CVA, learning disability, Diabetes, brain tumor,     PT Frequency  2x / week    PT Duration  8 weeks    PT Treatment/Interventions  ADLs/Self Care Home Management;Aquatic Therapy;Ultrasound;Moist Heat;Traction;DME Instruction;Gait training;Stair training;Functional mobility training;Therapeutic activities;Therapeutic exercise;Orthotic Fit/Training;Neuromuscular re-education;Balance training;Patient/family education;Manual techniques;Wheelchair mobility training;Passive range of motion;Energy conservation;Taping;Visual/perceptual remediation/compensation    PT Next Visit Plan  progress BLE strengthening, work on bed mobility/rolling, posture    PT Home Exercise Plan  TrA contraction, glute sets: needs updates. Potentially some time in supine flat in hospital bed, to improve  hip extension deficits.     Consulted and Agree with Plan of Care  Patient       Patient will benefit from skilled therapeutic intervention in order to improve the following deficits and impairments:  Abnormal gait, Decreased activity tolerance, Decreased balance, Decreased knowledge of precautions, Decreased endurance, Decreased coordination, Decreased knowledge of use of DME, Decreased mobility, Decreased range of motion, Difficulty walking, Decreased safety awareness, Decreased strength, Impaired flexibility, Impaired perceived functional ability, Impaired tone, Postural dysfunction, Improper body mechanics, Pain  Visit Diagnosis: Muscle weakness (generalized)  Other abnormalities of gait and mobility     Problem List Patient Active Problem List   Diagnosis Date Noted  . Acute CVA (cerebrovascular accident) ([place] 05/02/2016  . Left-sided weakness 05/01/2016   JPhillips GroutPT, DPT, GCS  Henslee Lottman 08/26/2018, 11:32 AM  [place] [facility] [address] [address]Phone: [phone]  Fax:  [phone] Name: Curtis GOMBERTMRN: [id number] of Birth: 931-Oct-1967

## 2018-08-27 ENCOUNTER — Ambulatory Visit: Payer: Medicare HMO | Admitting: Occupational Therapy

## 2018-08-27 ENCOUNTER — Encounter: Payer: Self-pay | Admitting: Occupational Therapy

## 2018-08-27 ENCOUNTER — Other Ambulatory Visit: Payer: Self-pay

## 2018-08-27 ENCOUNTER — Ambulatory Visit: Payer: Medicare HMO

## 2018-08-27 DIAGNOSIS — M6281 Muscle weakness (generalized): Secondary | ICD-10-CM

## 2018-08-27 DIAGNOSIS — R278 Other lack of coordination: Secondary | ICD-10-CM

## 2018-08-27 DIAGNOSIS — R2689 Other abnormalities of gait and mobility: Secondary | ICD-10-CM

## 2018-08-27 NOTE — Therapy (Signed)
[place] MAIN [facility] SERVICES [address] [place], [place], [address] Phone: [phone]   Fax:  [phone]  Physical Therapy Treatment  Patient Details  Name: Curtis Powell MRN: [id number] Date of Birth: 02-07-1966 No data recorded  Encounter Date: 08/27/2018  PT End of Session - 08/27/18 1535    Visit Number  [id number]    Number of Visits  137    Date for PT Re-Evaluation  10/20/18    Authorization Type  last goals: 06/09/18, updated 08/25/18    Authorization Time Period  authorized 05/12/18-11/10/18    PT Start Time  1532    PT Stop Time  1615    PT Time Calculation (min)  43 min    Equipment Utilized During Treatment  Gait belt    Activity Tolerance  Patient tolerated treatment well    Behavior During Therapy  WFL for tasks assessed/performed       Past Medical History:  Diagnosis Date  . GERD (gastroesophageal reflux disease)   . Hyperlipidemia   . Hypertension   . Obesity   . Stroke [facility])     Past Surgical History:  Procedure Laterality Date  . BRAIN SURGERY      There were no vitals filed for this visit.  Subjective Assessment - 08/27/18 1535    Subjective  Pt reports that he is doing well today. He has continued to perform his HEP. No falls since his last therapy session. Denies pain upon arrival. No specific questions or concerns upon arrival.     Pertinent History   Patient is a pleasant 53 year old male who presents to physical therapy for weakness and immobility secondary to CVA.  Had a stroke in Feb 2018. He was previously fully independent, but this stroke caused severe residual deficits, mainly on the right side as well as speech, and now he is unable to walk or perform most of his ADLs on his own. Entire right side is very weak. He still has a little difficulty with speech but his swallowing is improved to baseline. His mom had to move in with him and now is his main caregiver.     Currently in Pain?  No/denies             TREATMENT   Ther-ex  Forward/backward ambulation in // bars with UE support x 2 lengths each with seated rest break in between sets; Standing mini squats in // bars with UE support 2 x 10; Standing heel raises in // bars with UE support 2 x 10 each; Standing marches in // bars with UE support 2 x 10 each Standing balance practice without UE support in bars for varying duration, extensive verbal and tactile cues for anterior weight shifting and posture, pt encouraged to reach forward with arms for improved anterior weight shifting; Standing hip abduction 2 x 10 bilateral;   Pt educated throughout session about proper posture and technique with exercises. Improved exercise technique, movement at target joints, use of target muscles after min to mod verbal, visual, tactile cues.     Pt demonstrates good motivation during session today. He continues to struggle with static balance without continual UE support however it improves with practice during session. Pt continues to require frequent rest breaks throughout session due to fatigue. His gait speed is very slow during gait practice in parallel bars. Pt encouraged to continue his HEP and follow-up as scheduled.  PT Short Term Goals - 08/25/18 1528      PT SHORT TERM GOAL #1   Title  Patient will be independent in home exercise program to improve strength/mobility for better functional independence with ADLs.    Baseline  hep compliant    Time  2    Period  Weeks    Status  Achieved      PT SHORT TERM GOAL #2   Title  Patient will require min cueing for STS transfer with CGA for increased independence with mobility.     Baseline  CGA    Time  2    Period  Weeks    Status  Achieved      PT SHORT TERM GOAL #3   Title  Patient will maintain upright posture for > 15 seconds to demonstrate strengthened postural control muscualture     Baseline  18 seconds     Time  2    Period  Weeks     Status  Achieved        PT Long Term Goals - 08/25/18 1537      PT LONG TERM GOAL #1   Title  Patient will perform bed mobility with Supervision to increase independent mobility    Baseline  able to perform mod I for increased time , occasional Min A for scooting up in bed; 7/16: min guard for safety with increased effort and time, cues to scoot back in bed before sit>supine 9/12: can do it independently but with difficulty    Time  8    Period  Weeks    Status  Achieved      PT LONG TERM GOAL #2   Title  Patient (< 85 years old) will complete five times sit to stand test in < 10 seconds indicating an increased LE strength and improved balance.    Baseline  2/18: 30 seconds from plinth chair height single UE on seat and single on walker ; 32 seconds from plinth with single UE on seat and single on walker. 4/10: 26 seconds from [facility] with one hand on walker5/29: 22.2 seconds with one hand on walker one hand on WC; 7/16: one hand on walker one hand on WC 18.96 seconds; pt achieving 75% of full upright standing 8/22:one hand on walker one hand on WC 18 seconds; pt achieving 75% of full upright standing  9/12: 18 seconds econds; pt achieving 75% of full upright standing 8/22:one hand on walker one hand on WC 10/15: 17seconds; pt achieving 75% of full upright posture 11/7: 16 seconds; pt achieving 75 upright posture 12/19: 15 seconds 80% upright posture 3/3: 18 seconds 80% upright posture. 5/19: 20.6s from [place], one hand on walker and one on WC, 80% upright posture    Time  8    Period  Weeks    Status  Partially Met    Target Date  10/20/18      PT LONG TERM GOAL #3   Title  Patient will ambulate 40 ft with walker with no breaks to allow for increased mobility within home.     Baseline  12/26: 29 ft with walker and WC follow with no rest breaks; 2/18: multiple trials previous session of >100 ft     Time  8    Period  Weeks    Status  Achieved      PT LONG TERM GOAL #4   Title  Patient will  increase BLE gross strength to 4+/5 as to improve  functional strength for independent gait, increased standing tolerance and increased ADL ability.    Baseline   4-/5 RLE, 10/15: 4/5 RLE 11/7: 4/5 RLE 3/3: 4/5     Time  8    Period  Weeks    Status  Deferred    Target Date  10/20/18      PT LONG TERM GOAL #5   Title  Patient will increase lower extremity functional scale to >40/80 to demonstrate improved functional mobility and increased tolerance with ADLs.     Baseline  14/80; 12/26: 21/80; 2/18: 28/80  4/10; 25/80 5/29: 27/80; 7/16: 29/80 8/22: 30/80  9/12: 26/80 (feeling more tired due to moving) 10/15: 37/80 11/7: 23/80 3/3: 32/80; 08/25/18: 28/80    Time  8    Period  Weeks    Status  Partially Met    Target Date  10/20/18      Additional Long Term Goals   Additional Long Term Goals  Yes      PT LONG TERM GOAL #6   Title  Patient will walk through a door involving one small step up/down to increase functional independence.     Baseline  require CGA  BUE support in // bars at this time 10/15: required BUE and CGA in //bars; 08/25/18: Unsafe to attempt at this time    Time  8    Period  Weeks    Status  Deferred    Target Date  10/20/18      PT LONG TERM GOAL #7   Title  Patient will ambulate 40 ft with least assistive device and Supervision with no breaks to allow for increased mobility within home.     Baseline  requires use of walker and CGA     Time  8    Period  Weeks    Status  Achieved      PT LONG TERM GOAL #8   Title  Patient will perform 10 MWT in < 1 minute to demonstrate improved ambulatory velocity and capacity.     Baseline  4/10: 2 minutes 45 seconds; 5/29: 2 minute 31 seconds; 3 minutes 22 seconds 8/22: 3 minutes 2 seconds  9/12:  2 minutes and 43 seconds 10/15: 43mnute 12 seconds 11/7: 1 minute 21 seconds 12/19: 1 min 16 seconds 3/3: 57 seconds; 08/26/18: 1:41.4s= 0.10 m/s    Time  8    Period  Weeks    Status  Partially Met    Target Date  10/20/18      PT  LONG TERM GOAL  #9   TITLE  Patient will increase Berg Balance score by > 6 points (26/56) to demonstrate decreased fall risk during functional activities.    Baseline  9/12: 20/56 10/15: 21/56 11/7: 20/56 12/19: 22/56; 08/26/18: 11/56    Time  8    Period  Weeks    Status  Partially Met    Target Date  10/20/18      PT LONG TERM GOAL  #10   TITLE  Patient (< 650years old) will complete five times sit to stand test with full upright posture in < 30 seconds indicating an increased glute activation, LE strength and improved balance.    Baseline  10/15: 37seconds 11/7: 20 seconds     Time  8    Period  Weeks    Status  Achieved      PT LONG TERM GOAL  #11   TITLE  Patient will ambulate 100 ft with  least assistive device and Supervision with no breaks to allow for increased mobility within home.     Baseline  10/17: 65f 11/7: 1084f   Time  8    Period  Weeks    Status  Achieved      PT LONG TERM GOAL  #12   TITLE  Patient (< [age]ears old) will complete five times sit to stand test with full upright posture in < 15 seconds indicating increased glute activation, LE strength and improved balance.    Baseline  11/7: 20 seconds 12/19: 18 seconds 3/3: 18 seconds; 08/25/18: 20.6s from [place] on walker and one on WC, 80% upright posture    Time  8    Period  Weeks    Status  Partially Met    Target Date  10/20/18      PT LONG TERM GOAL  #13   TITLE  Patient will ambulate 150 ft with least assistive device and Supervision with no breaks to allow for increased mobility within home.     Baseline  11/7: 10062f2/19: 115 ft  3/3: will assess next session, has done 200 ft with one rest break; 08/26/18: Pt fatigued after 30' today    Time  8    Period  Weeks    Status  Partially Met    Target Date  10/20/18      PT LONG TERM GOAL  #14   TITLE  Patient will decrease 10 meter walk test to <30 seconds as to improve gait speed for better community ambulation and to reduce fall risk.    Baseline   3/3: 57 seconds with RW and CGA; 08/26/18: 1:41    Time  8    Period  Weeks    Status  On-going    Target Date  10/20/18            Plan - 08/27/18 1535    Clinical Impression Statement  Pt demonstrates good motivation during session today. He continues to struggle with static balance without continual UE support however it improves with practice during session. Pt continues to require frequent rest breaks throughout session due to fatigue. His gait speed is very slow during gait practice in parallel bars. Pt encouraged to continue his HEP and follow-up as scheduled.    Rehab Potential  Fair    Clinical Impairments Affecting Rehab Potential  hx of HTN, HLD, CVA, learning disability, Diabetes, brain tumor,     PT Frequency  2x / week    PT Duration  8 weeks    PT Treatment/Interventions  ADLs/Self Care Home Management;Aquatic Therapy;Ultrasound;Moist Heat;Traction;DME Instruction;Gait training;Stair training;Functional mobility training;Therapeutic activities;Therapeutic exercise;Orthotic Fit/Training;Neuromuscular re-education;Balance training;Patient/family education;Manual techniques;Wheelchair mobility training;Passive range of motion;Energy conservation;Taping;Visual/perceptual remediation/compensation    PT Next Visit Plan  progress BLE strengthening, work on bed mobility/rolling, posture    PT Home Exercise Plan  TrA contraction, glute sets: needs updates. Potentially some time in supine flat in hospital bed, to improve hip extension deficits.     Consulted and Agree with Plan of Care  Patient       Patient will benefit from skilled therapeutic intervention in order to improve the following deficits and impairments:  Abnormal gait, Decreased activity tolerance, Decreased balance, Decreased knowledge of precautions, Decreased endurance, Decreased coordination, Decreased knowledge of use of DME, Decreased mobility, Decreased range of motion, Difficulty walking, Decreased safety  awareness, Decreased strength, Impaired flexibility, Impaired perceived functional ability, Impaired tone, Postural dysfunction, Improper body mechanics, Pain  Visit Diagnosis: Muscle weakness (generalized)  Other abnormalities of gait and mobility     Problem List Patient Active Problem List   Diagnosis Date Noted  . Acute CVA (cerebrovascular accident) ([place]) 05/02/2016  . Left-sided weakness 05/01/2016   Phillips Grout PT, DPT, GCS  Huprich,Jason 08/28/2018, 8:50 AM  [place] MAIN [facility] SERVICES [address] [place], [place], [address] Phone: [phone]   Fax:  [phone]  Name: Curtis Powell MRN: [id number] Date of Birth: 1965-11-19

## 2018-08-27 NOTE — Therapy (Signed)
[place] MAIN [facility] SERVICES [address] [place], [place], [address] Phone: [phone]   Fax:  [phone]  Occupational Therapy Treatment  Patient Details  Name: Curtis Powell MRN: [id number] Date of Birth: Mar 19, 1966 No data recorded  Encounter Date: 08/27/2018  OT End of Session - 08/27/18 1644    Visit Number  97    Number of Visits  113    Date for OT Re-Evaluation  11/17/18    Authorization Type  Progress reporting period starting 04/23/2018    OT Start Time  1620    OT Stop Time  1705    OT Time Calculation (min)  45 min    Equipment Utilized During Treatment  Long handled shoe horn    Activity Tolerance  Patient tolerated treatment well    Behavior During Therapy  WFL for tasks assessed/performed       Past Medical History:  Diagnosis Date  . GERD (gastroesophageal reflux disease)   . Hyperlipidemia   . Hypertension   . Obesity   . Stroke [facility])     Past Surgical History:  Procedure Laterality Date  . BRAIN SURGERY      There were no vitals filed for this visit.  Subjective Assessment - 08/27/18 1643    Subjective   Pt. reports he feels like he is progressing.    Pertinent History  Pt. is a 53 y.o. male who suffered a CVA on 05/01/2016. Pt. was admitted to the hospital. Once discharged, he received Home Health PT and OT services for about a month. Pt. has had multiple CVAs over the past 8 years, and has had multiple falls in the past 6 months. Pt. resides in an apartment. Pt. has caregivers for 80 hours. Pt.'s mother stays with pt. at night, and assists with IADL tasks.      Patient Stated Goals  To be able to throw a ball and dribble a ball, do as much as I can for myself.     Currently in Pain?  No/denies       OT TREATMENT    Neuro muscular re-education:  Pt. worked on grasping large flat shapes, and moving them through varying heights of vertical dowels. Pt. performed 2 reps through 3 vertical rungs.    Selfcare:  CGA toileting skills while standing at the toilet. MinA hiking clothing, and clothing negotiation.  Response to Treatment:  Pt. Reported feeling dizziness when hiking clothing following toileting. Improved functional reaching with the right. Cues were required to avoid compensation proximally.                     OT Education - 08/27/18 1644    Education provided  Yes    Education Details  RUE Functioning    Person(s) Educated  Patient    Methods  Explanation;Demonstration    Comprehension  Verbalized understanding;Returned demonstration;Verbal cues required       OT Short Term Goals - 03/03/17 1459      OT SHORT TERM GOAL #1   Title  `        OT Long Term Goals - 08/25/18 1657      OT LONG TERM GOAL #4   Title  Pt. will perform self dressing with minA and A/E as needed.    Baseline  08/25/2018: Pt. continues to improve LB dressing in standing. Pt.  continues to require increased time and mod A donn socks, and pants over his feet. CGA standing to  hike clothing. Pt. requires min A donning a jacket, and Independent doning a shirt.    Time  12    Period  Weeks    Status  On-going    Target Date  11/17/18      OT LONG TERM GOAL #6   Title  Pt. will write his name efficiently with 100% legibility    Baseline  08/25/2018: Pt continues to present with limited legibility and efficiency when writing name to sign in when riding the [place]    Time  12    Period  Weeks    Status  On-going    Target Date  11/17/18      OT LONG TERM GOAL #7   Title  Pt will independently and consistently follow HEP to increase UE strength to increase functional independence    Baseline  08/25/2018: Pt to continues to work towards independence with exercises    Time  12    Period  Weeks    Status  On-going    Target Date  11/17/18      OT LONG TERM GOAL #8   Title  Pt. will require supervision ironing a shirt.    Baseline  08/25/2018: Pt. requires minA, and complete  set-up seated using a tabletop iron    Time  12    Period  Weeks    Status  On-going    Target Date  11/17/18      OT LONG TERM GOAL  #9   Baseline  Pt. will independently be able to sew a button onto a shirt.    Time  12    Period  Weeks    Status  On-going    Target Date  11/17/18      OT LONG TERM GOAL  #10   TITLE  Pt. will independently be able to throw a ball    Baseline  08/25/2018: Pt. is unable.    Time  12    Period  Weeks    Status  On-going    Target Date  11/17/18      OT LONG TERM GOAL  #11   TITLE  Pt. will improve UE functional reaching to be able to independently use his RUE to hand his clothes in the closest.    Baseline   08/25/2018: Pt. continues to have difficulty hanging his clothes up in his closet.    Time  12    Period  Weeks    Status  On-going    Target Date  05/27/18            Plan - 08/27/18 1648    Clinical Impression Statement  Pt. reports that he has been having dizziness when bending forward to retrrieve pants for hiking. Pt.'s BP: 121/55, HR 101. Pt. has new caregivers for a new agency that come from 8-12 daily, and 2 hours each day on the weekend. Pt. continues to work on improving RUE strength, functional reaching, and [facility] skills in order to be able to improve ADL, and IADL functioning.     Occupational performance deficits (Please refer to evaluation for details):  ADL's;IADL's    Rehab Potential  Fair    Clinical Decision Making  Several treatment options, min-mod task modification necessary    OT Frequency  2x / week    OT Duration  12 weeks    OT Treatment/Interventions  Self-care/ADL training;Patient/family education;DME and/or AE instruction;Therapeutic exercise;Moist Heat;Neuromuscular education    Consulted and Agree with Plan of  Care  Patient       Patient will benefit from skilled therapeutic intervention in order to improve the following deficits and impairments:     Visit Diagnosis: Muscle weakness  (generalized)  Other lack of coordination    Problem List Patient Active Problem List   Diagnosis Date Noted  . Acute CVA (cerebrovascular accident) ([place]) 05/02/2016  . Left-sided weakness 05/01/2016    Harrel Carina, MS,  OTR/L 08/27/2018, 5:03 PM  [place] MAIN [facility] SERVICES [address] [place], [place], [address] Phone: [phone]   Fax:  [phone]  Name: Curtis Powell MRN: [id number] Date of Birth: [birth year]

## 2018-09-01 ENCOUNTER — Ambulatory Visit: Payer: Medicare HMO

## 2018-09-01 ENCOUNTER — Encounter: Payer: Medicare HMO | Admitting: Occupational Therapy

## 2018-09-03 ENCOUNTER — Ambulatory Visit: Payer: Medicare HMO

## 2018-09-03 ENCOUNTER — Encounter: Payer: Self-pay | Admitting: Occupational Therapy

## 2018-09-03 ENCOUNTER — Ambulatory Visit: Payer: Medicare HMO | Admitting: Occupational Therapy

## 2018-09-03 ENCOUNTER — Other Ambulatory Visit: Payer: Self-pay

## 2018-09-03 DIAGNOSIS — M6281 Muscle weakness (generalized): Secondary | ICD-10-CM

## 2018-09-03 DIAGNOSIS — R278 Other lack of coordination: Secondary | ICD-10-CM

## 2018-09-03 DIAGNOSIS — R2689 Other abnormalities of gait and mobility: Secondary | ICD-10-CM

## 2018-09-03 NOTE — Therapy (Signed)
[place] MAIN [facility] SERVICES [address] [place], [place], [address] Phone: [phone]   Fax:  [phone]  Occupational Therapy Treatment  Patient Details  Name: Curtis Powell MRN: [id number] Date of Birth: 17-Dec-1965 No data recorded  Encounter Date: 09/03/2018  OT End of Session - 09/03/18 1540    Visit Number  98    Number of Visits  113    Date for OT Re-Evaluation  11/17/18    Authorization Type  Progress reporting period starting 04/23/2018    OT Start Time  1530    OT Stop Time  1625    OT Time Calculation (min)  55 min    Equipment Utilized During Treatment  Long handled shoe horn    Activity Tolerance  Patient tolerated treatment well    Behavior During Therapy  WFL for tasks assessed/performed       Past Medical History:  Diagnosis Date  . GERD (gastroesophageal reflux disease)   . Hyperlipidemia   . Hypertension   . Obesity   . Stroke [facility])     Past Surgical History:  Procedure Laterality Date  . BRAIN SURGERY      There were no vitals filed for this visit.  Subjective Assessment - 09/03/18 1536    Subjective   Pt. reports that he feels good.     Pertinent History  Pt. is a 53 y.o. male who suffered a CVA on 05/01/2016. Pt. was admitted to the hospital. Once discharged, he received Home Health PT and OT services for about a month. Pt. has had multiple CVAs over the past 8 years, and has had multiple falls in the past 6 months. Pt. resides in an apartment. Pt. has caregivers for 80 hours. Pt.'s mother stays with pt. at night, and assists with IADL tasks.      Patient Stated Goals  To be able to throw a ball and dribble a ball, do as much as I can for myself.     Currently in Pain?  No/denies      OT TREATMENT    Neuro muscular re-education:  Pt. worked on [place] reaching using the [employer]. Pt. grasped and moved the shapes through 3 vertical dowels of progressively increasing heights.   Therapeutic  Exercise:  Pt. performed gross gripping with the grip strengthener. Pt. worked on sustaining grip while grasping pegs and reaching at various heights. The gripper was placed in the 3rd resistive slot with the white resistive spring. Pt. worked on pinch strengthening in the left hand for lateral, and 3pt. pinch using yellow, red, green, and blue resistive clips. Pt. worked on placing the clips at various vertical and horizontal angles. Tactile and verbal cues were required for eliciting the desired movement.  Self-care:  Pt. worked on toileting skills requiring CGA with no dizziness. Pt. education was provided about keeping the waist band of his pants higher up on his legs in order to be able to grasp, and hike the pants without having to bend over too far.  Response to treatment:  Pt. responded well to the treatment. No dizziness with toileting with the new method of hiking his pants. Improved reaching.                            OT Education - 09/03/18 1540    Education provided  Yes    Education Details  RUE Functioning    Person(s) Educated  Patient  Methods  Explanation;Demonstration    Comprehension  Verbalized understanding;Returned demonstration;Verbal cues required       OT Short Term Goals - 03/03/17 1459      OT SHORT TERM GOAL #1   Title  `        OT Long Term Goals - 08/25/18 1657      OT LONG TERM GOAL #4   Title  Pt. will perform self dressing with minA and A/E as needed.    Baseline  08/25/2018: Pt. continues to improve LB dressing in standing. Pt.  continues to require increased time and mod A donn socks, and pants over his feet. CGA standing to hike clothing. Pt. requires min A donning a jacket, and Independent doning a shirt.    Time  12    Period  Weeks    Status  On-going    Target Date  11/17/18      OT LONG TERM GOAL #6   Title  Pt. will write his name efficiently with 100% legibility    Baseline  08/25/2018: Pt continues to  present with limited legibility and efficiency when writing name to sign in when riding the [place]    Time  12    Period  Weeks    Status  On-going    Target Date  11/17/18      OT LONG TERM GOAL #7   Title  Pt will independently and consistently follow HEP to increase UE strength to increase functional independence    Baseline  08/25/2018: Pt to continues to work towards independence with exercises    Time  12    Period  Weeks    Status  On-going    Target Date  11/17/18      OT LONG TERM GOAL #8   Title  Pt. will require supervision ironing a shirt.    Baseline  08/25/2018: Pt. requires minA, and complete set-up seated using a tabletop iron    Time  12    Period  Weeks    Status  On-going    Target Date  11/17/18      OT LONG TERM GOAL  #9   Baseline  Pt. will independently be able to sew a button onto a shirt.    Time  12    Period  Weeks    Status  On-going    Target Date  11/17/18      OT LONG TERM GOAL  #10   TITLE  Pt. will independently be able to throw a ball    Baseline  08/25/2018: Pt. is unable.    Time  12    Period  Weeks    Status  On-going    Target Date  11/17/18      OT LONG TERM GOAL  #11   TITLE  Pt. will improve UE functional reaching to be able to independently use his RUE to hand his clothes in the closest.    Baseline   08/25/2018: Pt. continues to have difficulty hanging his clothes up in his closet.    Time  12    Period  Weeks    Status  On-going    Target Date  05/27/18            Plan - 09/03/18 1543    Clinical Impression Statement  Pt. has made progress overall with bilateral UE strength, and [facility] skills. Pt. presented with improved dizziness today during toileting tasks. Pt. continues to present with limited bilateral UE  functional reaching, gripping, pinching, and [place] skills in order to improve overall ADL, and IADL functioning including dressing, bathing, ironing, and sewing.    Occupational performance deficits (Please refer to  evaluation for details):  ADL's;IADL's    Rehab Potential  Fair    Clinical Decision Making  Several treatment options, min-mod task modification necessary    OT Frequency  2x / week    OT Duration  12 weeks    OT Treatment/Interventions  Self-care/ADL training;Patient/family education;DME and/or AE instruction;Therapeutic exercise;Moist Heat;Neuromuscular education    Consulted and Agree with Plan of Care  Patient       Patient will benefit from skilled therapeutic intervention in order to improve the following deficits and impairments:     Visit Diagnosis: Muscle weakness (generalized)  Other lack of coordination    Problem List Patient Active Problem List   Diagnosis Date Noted  . Acute CVA (cerebrovascular accident) ([place]) 05/02/2016  . Left-sided weakness 05/01/2016    Harrel Carina, MS, OTR/L 09/03/2018, 5:21 PM  [place] MAIN [facility] SERVICES [address] [place], [place], [address] Phone: [phone]   Fax:  [phone]  Name: Curtis Powell MRN: [id number] Date of Birth: [birth year]

## 2018-09-03 NOTE — Therapy (Signed)
[place] MAIN [facility] SERVICES [address] [place], [place], [address] Phone: [phone]   Fax:  [phone]  Physical Therapy Treatment  Patient Details  Name: Curtis Powell MRN: [id number] Date of Birth: [birth year] No data recorded  Encounter Date: 09/03/2018  PT End of Session - 09/03/18 1556    Visit Number  112    Number of Visits  137    Date for PT Re-Evaluation  10/20/18    Authorization Type  last goals: 06/09/18, updated 08/25/18    Authorization Time Period  authorized 05/12/18-11/10/18    PT Start Time  73    PT Stop Time  1715    PT Time Calculation (min)  45 min    Equipment Utilized During Treatment  Gait belt    Activity Tolerance  Patient tolerated treatment well    Behavior During Therapy  WFL for tasks assessed/performed      2- Past Medical History:  Diagnosis Date  . GERD (gastroesophageal reflux disease)   . Hyperlipidemia   . Hypertension   . Obesity   . Stroke [facility])     Past Surgical History:  Procedure Laterality Date  . BRAIN SURGERY      There were no vitals filed for this visit.  Subjective Assessment - 09/03/18 1555    Subjective  Pt reports that he is doing well today. He has continued to perform his HEP. No falls since his last therapy session. Denies pain upon arrival. No specific questions or concerns upon arrival.     Pertinent History   Patient is a pleasant 53 year old male who presents to physical therapy for weakness and immobility secondary to CVA.  Had a stroke in Feb 2018. He was previously fully independent, but this stroke caused severe residual deficits, mainly on the right side as well as speech, and now he is unable to walk or perform most of his ADLs on his own. Entire right side is very weak. He still has a little difficulty with speech but his swallowing is improved to baseline. His mom had to move in with him and now is his main caregiver.     Currently in Pain?  No/denies          TREATMENT   Gait Training Ambulation with wheelchair follow and rolling walker. Cues for upright posture and to increase hip flexion to improve toe clearance during swing. 59' followed by rest break and then an additional  51';   Ther-ex  Standing balance with back again wall practicing upright posture 1 minute x 2, cues for scapular and cervical retraction to improve standing posture; Standing wall mini squats with back against wall for improved upright posture and no UE support 2 x 10; Standing bilateral heel raises in // bars with UE support 2 x 10; Standing marches in // bars with UE support 2 x 10 on each side; Standing balance practice without UE support in // bars for varying duration, extensive verbal and tactile cues for anterior weight shifting and posture.   Pt educated throughout session about proper posture and technique with exercises. Improved exercise technique, movement at target joints, use of target muscles after min to mod verbal, visual, tactile cues.    Pt demonstrates good motivation during session today. He demonstrates improved static balance in parallel bars without UE support. He is able to increase his ambulation distance significantly today and demonstrates improved posture after practicing against the wall. Pt encouraged to continue his  HEP and follow-up as scheduled. Pt will benefit from PT services to address deficits in strength, balance, and mobility in order to return to full function at home.                         PT Short Term Goals - 08/25/18 1528      PT SHORT TERM GOAL #1   Title  Patient will be independent in home exercise program to improve strength/mobility for better functional independence with ADLs.    Baseline  hep compliant    Time  2    Period  Weeks    Status  Achieved      PT SHORT TERM GOAL #2   Title  Patient will require min cueing for STS transfer with CGA for increased independence with  mobility.     Baseline  CGA    Time  2    Period  Weeks    Status  Achieved      PT SHORT TERM GOAL #3   Title  Patient will maintain upright posture for > 15 seconds to demonstrate strengthened postural control muscualture     Baseline  18 seconds     Time  2    Period  Weeks    Status  Achieved        PT Long Term Goals - 08/25/18 1537      PT LONG TERM GOAL #1   Title  Patient will perform bed mobility with Supervision to increase independent mobility    Baseline  able to perform mod I for increased time , occasional Min A for scooting up in bed; 7/16: min guard for safety with increased effort and time, cues to scoot back in bed before sit>supine 9/12: can do it independently but with difficulty    Time  8    Period  Weeks    Status  Achieved      PT LONG TERM GOAL #2   Title  Patient (< 78 years old) will complete five times sit to stand test in < 10 seconds indicating an increased LE strength and improved balance.    Baseline  2/18: 30 seconds from plinth chair height single UE on seat and single on walker ; 32 seconds from plinth with single UE on seat and single on walker. 4/10: 26 seconds from [facility] with one hand on walker5/29: 22.2 seconds with one hand on walker one hand on WC; 7/16: one hand on walker one hand on WC 18.96 seconds; pt achieving 75% of full upright standing 8/22:one hand on walker one hand on WC 18 seconds; pt achieving 75% of full upright standing  9/12: 18 seconds econds; pt achieving 75% of full upright standing 8/22:one hand on walker one hand on WC 10/15: 17seconds; pt achieving 75% of full upright posture 11/7: 16 seconds; pt achieving 75 upright posture 12/19: 15 seconds 80% upright posture 3/3: 18 seconds 80% upright posture. 5/19: 20.6s from [place], one hand on walker and one on WC, 80% upright posture    Time  8    Period  Weeks    Status  Partially Met    Target Date  10/20/18      PT LONG TERM GOAL #3   Title  Patient will ambulate 40 ft with walker  with no breaks to allow for increased mobility within home.     Baseline  12/26: 29 ft with walker and WC follow with no rest breaks;  2/18: multiple trials previous session of >100 ft     Time  8    Period  Weeks    Status  Achieved      PT LONG TERM GOAL #4   Title  Patient will increase BLE gross strength to 4+/5 as to improve functional strength for independent gait, increased standing tolerance and increased ADL ability.    Baseline   4-/5 RLE, 10/15: 4/5 RLE 11/7: 4/5 RLE 3/3: 4/5     Time  8    Period  Weeks    Status  Deferred    Target Date  10/20/18      PT LONG TERM GOAL #5   Title  Patient will increase lower extremity functional scale to >40/80 to demonstrate improved functional mobility and increased tolerance with ADLs.     Baseline  14/80; 12/26: 21/80; 2/18: 28/80  4/10; 25/80 5/29: 27/80; 7/16: 29/80 8/22: 30/80  9/12: 26/80 (feeling more tired due to moving) 10/15: 37/80 11/7: 23/80 3/3: 32/80; 08/25/18: 28/80    Time  8    Period  Weeks    Status  Partially Met    Target Date  10/20/18      Additional Long Term Goals   Additional Long Term Goals  Yes      PT LONG TERM GOAL #6   Title  Patient will walk through a door involving one small step up/down to increase functional independence.     Baseline  require CGA  BUE support in // bars at this time 10/15: required BUE and CGA in //bars; 08/25/18: Unsafe to attempt at this time    Time  8    Period  Weeks    Status  Deferred    Target Date  10/20/18      PT LONG TERM GOAL #7   Title  Patient will ambulate 40 ft with least assistive device and Supervision with no breaks to allow for increased mobility within home.     Baseline  requires use of walker and CGA     Time  8    Period  Weeks    Status  Achieved      PT LONG TERM GOAL #8   Title  Patient will perform 10 MWT in < 1 minute to demonstrate improved ambulatory velocity and capacity.     Baseline  4/10: 2 minutes 45 seconds; 5/29: 2 minute 31 seconds; 3  minutes 22 seconds 8/22: 3 minutes 2 seconds  9/12:  2 minutes and 43 seconds 10/15: 84mnute 12 seconds 11/7: 1 minute 21 seconds 12/19: 1 min 16 seconds 3/3: 57 seconds; 08/26/18: 1:41.4s= 0.10 m/s    Time  8    Period  Weeks    Status  Partially Met    Target Date  10/20/18      PT LONG TERM GOAL  #9   TITLE  Patient will increase Berg Balance score by > 6 points (26/56) to demonstrate decreased fall risk during functional activities.    Baseline  9/12: 20/56 10/15: 21/56 11/7: 20/56 12/19: 22/56; 08/26/18: 11/56    Time  8    Period  Weeks    Status  Partially Met    Target Date  10/20/18      PT LONG TERM GOAL  #10   TITLE  Patient (< [age]years old) will complete five times sit to stand test with full upright posture in < 30 seconds indicating an increased glute activation, LE strength and  improved balance.    Baseline  10/15: 37seconds 11/7: 20 seconds     Time  8    Period  Weeks    Status  Achieved      PT LONG TERM GOAL  #11   TITLE  Patient will ambulate 100 ft with least assistive device and Supervision with no breaks to allow for increased mobility within home.     Baseline  10/17: 86f 11/7: 1055f   Time  8    Period  Weeks    Status  Achieved      PT LONG TERM GOAL  #12   TITLE  Patient (< [age]ears old) will complete five times sit to stand test with full upright posture in < 15 seconds indicating increased glute activation, LE strength and improved balance.    Baseline  11/7: 20 seconds 12/19: 18 seconds 3/3: 18 seconds; 08/25/18: 20.6s from [place] walker and one on WC, 80% upright posture    Time  8    Period  Weeks    Status  Partially Met    Target Date  10/20/18      PT LONG TERM GOAL  #13   TITLE  Patient will ambulate 150 ft with least assistive device and Supervision with no breaks to allow for increased mobility within home.     Baseline  11/7: 10071f2/19: 115 ft  3/3: will assess next session, has done 200 ft with one rest break; 08/26/18: Pt  fatigued after 30' today    Time  8    Period  Weeks    Status  Partially Met    Target Date  10/20/18      PT LONG TERM GOAL  #14   TITLE  Patient will decrease 10 meter walk test to <30 seconds as to improve gait speed for better community ambulation and to reduce fall risk.    Baseline  3/3: 57 seconds with RW and CGA; 08/26/18: 1:41    Time  8    Period  Weeks    Status  On-going    Target Date  10/20/18            Plan - 09/03/18 1556    Clinical Impression Statement  Pt demonstrates good motivation during session today. He demonstrates improved static balance in parallel bars without UE support. He is able to increase his ambulation distance significantly today and demonstrates improved posture after practicing against the wall. Pt encouraged to continue his HEP and follow-up as scheduled. Pt will benefit from PT services to address deficits in strength, balance, and mobility in order to return to full function at home.     Rehab Potential  Fair    Clinical Impairments Affecting Rehab Potential  hx of HTN, HLD, CVA, learning disability, Diabetes, brain tumor,     PT Frequency  2x / week    PT Duration  8 weeks    PT Treatment/Interventions  ADLs/Self Care Home Management;Aquatic Therapy;Ultrasound;Moist Heat;Traction;DME Instruction;Gait training;Stair training;Functional mobility training;Therapeutic activities;Therapeutic exercise;Orthotic Fit/Training;Neuromuscular re-education;Balance training;Patient/family education;Manual techniques;Wheelchair mobility training;Passive range of motion;Energy conservation;Taping;Visual/perceptual remediation/compensation    PT Next Visit Plan  progress BLE strengthening, work on bed mobility/rolling, posture    PT Home Exercise Plan  TrA contraction, glute sets: needs updates. Potentially some time in supine flat in hospital bed, to improve hip extension deficits.     Consulted and Agree with Plan of Care  Patient       Patient will  benefit from skilled therapeutic intervention in order to improve the following deficits and impairments:  Abnormal gait, Decreased activity tolerance, Decreased balance, Decreased knowledge of precautions, Decreased endurance, Decreased coordination, Decreased knowledge of use of DME, Decreased mobility, Decreased range of motion, Difficulty walking, Decreased safety awareness, Decreased strength, Impaired flexibility, Impaired perceived functional ability, Impaired tone, Postural dysfunction, Improper body mechanics, Pain  Visit Diagnosis: Muscle weakness (generalized)  Other lack of coordination  Other abnormalities of gait and mobility     Problem List Patient Active Problem List   Diagnosis Date Noted  . Acute CVA (cerebrovascular accident) ([place]) 05/02/2016  . Left-sided weakness 05/01/2016   Phillips Grout PT, DPT, GCS  Huprich,Jason 09/03/2018, 5:27 PM  [place] MAIN [facility] SERVICES [address] [place], [place], [address] Phone: [phone]   Fax:  [phone]  Name: TRAMAR BRUECKNER MRN: [id number] Date of Birth: Dec 06, 1965

## 2018-09-08 ENCOUNTER — Ambulatory Visit: Payer: Medicare HMO | Admitting: Occupational Therapy

## 2018-09-08 ENCOUNTER — Other Ambulatory Visit: Payer: Self-pay

## 2018-09-08 ENCOUNTER — Encounter: Payer: Self-pay | Admitting: Occupational Therapy

## 2018-09-08 ENCOUNTER — Ambulatory Visit: Payer: Medicare HMO | Attending: Family Medicine

## 2018-09-08 DIAGNOSIS — M6281 Muscle weakness (generalized): Secondary | ICD-10-CM

## 2018-09-08 DIAGNOSIS — R2689 Other abnormalities of gait and mobility: Secondary | ICD-10-CM | POA: Insufficient documentation

## 2018-09-08 DIAGNOSIS — R278 Other lack of coordination: Secondary | ICD-10-CM | POA: Insufficient documentation

## 2018-09-08 DIAGNOSIS — I69351 Hemiplegia and hemiparesis following cerebral infarction affecting right dominant side: Secondary | ICD-10-CM | POA: Diagnosis present

## 2018-09-08 NOTE — Therapy (Signed)
[place] MAIN [facility] SERVICES [address] [place], [place], [address] Phone: [phone]   Fax:  [phone]  Occupational Therapy Treatment  Patient Details  Name: Curtis Powell MRN: [id number] Date of Birth: 11-10-1965 No data recorded  Encounter Date: 09/08/2018  OT End of Session - 09/08/18 1624    Visit Number  99    Number of Visits  113    Date for OT Re-Evaluation  11/17/18    Authorization Type  Progress reporting period starting 04/23/2018    OT Start Time  1530    OT Stop Time  1610    OT Time Calculation (min)  40 min    Activity Tolerance  Patient tolerated treatment well    Behavior During Therapy  [facility] for tasks assessed/performed       Past Medical History:  Diagnosis Date  . GERD (gastroesophageal reflux disease)   . Hyperlipidemia   . Hypertension   . Obesity   . Stroke [facility])     Past Surgical History:  Procedure Laterality Date  . BRAIN SURGERY      There were no vitals filed for this visit.  Subjective Assessment - 09/08/18 1623    Subjective   Pt. reports that he feels good today.    Pertinent History  Pt. is a 53 y.o. male who suffered a CVA on 05/01/2016. Pt. was admitted to the hospital. Once discharged, he received Home Health PT and OT services for about a month. Pt. has had multiple CVAs over the past 8 years, and has had multiple falls in the past 6 months. Pt. resides in an apartment. Pt. has caregivers for 80 hours. Pt.'s mother stays with pt. at night, and assists with IADL tasks.      Patient Stated Goals  To be able to throw a ball and dribble a ball, do as much as I can for myself.     Currently in Pain?  No/denies      OT TREATMENT    Selfcare:  Pt. performed toileting skills with CGA standing at the toilet.  MinA to hike pants. Independent for hand hygiene seated from w/c height. Pt. worked on applying bilateral w/c leg rests. Pt. required minA with the left and maxA with the right.    Response to Treatment:  Pt. required cues, and visual demonstration for proper w/c legrest application.                        OT Education - 09/08/18 1623    Education provided  Yes    Education Details  w/c Secondary school teacher) Educated  Patient    Methods  Explanation;Demonstration    Comprehension  Verbalized understanding;Returned demonstration;Verbal cues required       OT Short Term Goals - 03/03/17 1459      OT SHORT TERM GOAL #1   Title  `        OT Long Term Goals - 08/25/18 1657      OT LONG TERM GOAL #4   Title  Pt. will perform self dressing with minA and A/E as needed.    Baseline  08/25/2018: Pt. continues to improve LB dressing in standing. Pt.  continues to require increased time and mod A donn socks, and pants over his feet. CGA standing to hike clothing. Pt. requires min A donning a jacket, and Independent doning a shirt.    Time  12  Period  Weeks    Status  On-going    Target Date  11/17/18      OT LONG TERM GOAL #6   Title  Pt. will write his name efficiently with 100% legibility    Baseline  08/25/2018: Pt continues to present with limited legibility and efficiency when writing name to sign in when riding the [place]    Time  12    Period  Weeks    Status  On-going    Target Date  11/17/18      OT LONG TERM GOAL #7   Title  Pt will independently and consistently follow HEP to increase UE strength to increase functional independence    Baseline  08/25/2018: Pt to continues to work towards independence with exercises    Time  12    Period  Weeks    Status  On-going    Target Date  11/17/18      OT LONG TERM GOAL #8   Title  Pt. will require supervision ironing a shirt.    Baseline  08/25/2018: Pt. requires minA, and complete set-up seated using a tabletop iron    Time  12    Period  Weeks    Status  On-going    Target Date  11/17/18      OT LONG TERM GOAL  #9   Baseline  Pt. will independently be able to sew a  button onto a shirt.    Time  12    Period  Weeks    Status  On-going    Target Date  11/17/18      OT LONG TERM GOAL  #10   TITLE  Pt. will independently be able to throw a ball    Baseline  08/25/2018: Pt. is unable.    Time  12    Period  Weeks    Status  On-going    Target Date  11/17/18      OT LONG TERM GOAL  #11   TITLE  Pt. will improve UE functional reaching to be able to independently use his RUE to hand his clothes in the closest.    Baseline   08/25/2018: Pt. continues to have difficulty hanging his clothes up in his closet.    Time  12    Period  Weeks    Status  On-going    Target Date  05/27/18            Plan - 09/08/18 1624    Clinical Impression Statement  Pt. is having a ramp installed to the back entrance of his home so that he can access his backyard, and shed. Pt. continues to make progress overall, however has difficulty applying the w/c legrests. Pt. will need to be able to independently apply the legrests onto the w/c once his mother returns to work. Pt. continues to present with limited BUE strength, and [facility] skills, and continues to work on using his BUEs to improve independence with ADLs, and IADLs.    Occupational performance deficits (Please refer to evaluation for details):  ADL's;IADL's    Rehab Potential  Fair    Clinical Decision Making  Several treatment options, min-mod task modification necessary    OT Frequency  2x / week    OT Duration  12 weeks    OT Treatment/Interventions  Self-care/ADL training;Patient/family education;DME and/or AE instruction;Therapeutic exercise;Moist Heat;Neuromuscular education    Consulted and Agree with Plan of Care  Patient  Patient will benefit from skilled therapeutic intervention in order to improve the following deficits and impairments:     Visit Diagnosis: Muscle weakness (generalized)  Other lack of coordination    Problem List Patient Active Problem List   Diagnosis Date Noted  . Acute  CVA (cerebrovascular accident) ([place]) 05/02/2016  . Left-sided weakness 05/01/2016    Harrel Carina, MS, OTR/L 09/08/2018, 4:36 PM  [place] MAIN [facility] SERVICES [address] [place], [place], [address] Phone: [phone]   Fax:  [phone]  Name: Curtis Powell MRN: [id number] Date of Birth: 05/31/1965

## 2018-09-08 NOTE — Therapy (Signed)
[place] MAIN [facility] SERVICES [address] [place], [place], [address] Phone: [phone]   Fax:  [phone]  Physical Therapy Treatment  Patient Details  Name: Curtis Powell MRN: [id number] Date of Birth: [birth year] No data recorded  Encounter Date: 09/08/2018  PT End of Session - 09/08/18 1435    Visit Number  113    Number of Visits  137    Date for PT Re-Evaluation  10/20/18    Authorization Type  last goals: 08/25/18    Authorization Time Period  authorized 05/12/18-11/10/18    PT Start Time  1430    PT Stop Time  1515    PT Time Calculation (min)  45 min    Equipment Utilized During Treatment  Gait belt    Activity Tolerance  Patient tolerated treatment well    Behavior During Therapy  WFL for tasks assessed/performed       Past Medical History:  Diagnosis Date  . GERD (gastroesophageal reflux disease)   . Hyperlipidemia   . Hypertension   . Obesity   . Stroke [facility])     Past Surgical History:  Procedure Laterality Date  . BRAIN SURGERY      There were no vitals filed for this visit.  Subjective Assessment - 09/08/18 1429    Subjective  Pt reports that he is doing well today. He has continued to perform his HEP. No falls since his last therapy session. Denies pain upon arrival. No specific questions or concerns upon arrival.     Pertinent History   Patient is a pleasant 53 year old male who presents to physical therapy for weakness and immobility secondary to CVA.  Had a stroke in Feb 2018. He was previously fully independent, but this stroke caused severe residual deficits, mainly on the right side as well as speech, and now he is unable to walk or perform most of his ADLs on his own. Entire right side is very weak. He still has a little difficulty with speech but his swallowing is improved to baseline. His mom had to move in with him and now is his main caregiver.     Currently in Pain?  No/denies          TREATMENT   Gait Training Ambulation with wheelchair follow and rolling walker. Frequent cues for upright posture and to increase hip flexion to improve toe clearance during swing. 59' followed by rest break and then an additional 3';   Ther-ex  NuStep L2 x 5 minutes for warm-up during history, fatigue monitored; Standing balance with back again wall practicing upright posture 1 minute x 2, cues for scapular and cervical retraction to improve standing posture; Standing wall mini squats with back against wall for improved upright posture and no UE support 2 x 10; Standing marches with back against the wall for improved upright posture, bilateral UE support 2 x 10; Standing bilateral heel raises in // bars with UE supportx 10; Standing balance practice without UE support in // bars for varying duration, extensive verbal and tactile cues for anterior weight shifting and posture.   Pt educated throughout session about proper posture and technique with exercises. Improved exercise technique, movement at target joints, use of target muscles after min to mod verbal, visual, tactile cues.   Pt demonstrates good motivation during session today. He demonstrates increased ambulation distance today. Unfortunately his static balance in parallel bars is slightly worse today. He is again able to improve his posture with  back against the wall and complete standing exercises with less reliance on bilateral UEs. Pt encouraged to continue his HEP and follow-up as scheduled. Pt will benefit from PT services to address deficits in strength, balance, and mobility in order to return to full function at home.                         PT Short Term Goals - 08/25/18 1528      PT SHORT TERM GOAL #1   Title  Patient will be independent in home exercise program to improve strength/mobility for better functional independence with ADLs.    Baseline  hep compliant    Time  2     Period  Weeks    Status  Achieved      PT SHORT TERM GOAL #2   Title  Patient will require min cueing for STS transfer with CGA for increased independence with mobility.     Baseline  CGA    Time  2    Period  Weeks    Status  Achieved      PT SHORT TERM GOAL #3   Title  Patient will maintain upright posture for > 15 seconds to demonstrate strengthened postural control muscualture     Baseline  18 seconds     Time  2    Period  Weeks    Status  Achieved        PT Long Term Goals - 08/25/18 1537      PT LONG TERM GOAL #1   Title  Patient will perform bed mobility with Supervision to increase independent mobility    Baseline  able to perform mod I for increased time , occasional Min A for scooting up in bed; 7/16: min guard for safety with increased effort and time, cues to scoot back in bed before sit>supine 9/12: can do it independently but with difficulty    Time  8    Period  Weeks    Status  Achieved      PT LONG TERM GOAL #2   Title  Patient (< 62 years old) will complete five times sit to stand test in < 10 seconds indicating an increased LE strength and improved balance.    Baseline  2/18: 30 seconds from plinth chair height single UE on seat and single on walker ; 32 seconds from plinth with single UE on seat and single on walker. 4/10: 26 seconds from [facility] with one hand on walker5/29: 22.2 seconds with one hand on walker one hand on WC; 7/16: one hand on walker one hand on WC 18.96 seconds; pt achieving 75% of full upright standing 8/22:one hand on walker one hand on WC 18 seconds; pt achieving 75% of full upright standing  9/12: 18 seconds econds; pt achieving 75% of full upright standing 8/22:one hand on walker one hand on WC 10/15: 17seconds; pt achieving 75% of full upright posture 11/7: 16 seconds; pt achieving 75 upright posture 12/19: 15 seconds 80% upright posture 3/3: 18 seconds 80% upright posture. 5/19: 20.6s from [place], one hand on walker and one on WC, 80% upright  posture    Time  8    Period  Weeks    Status  Partially Met    Target Date  10/20/18      PT LONG TERM GOAL #3   Title  Patient will ambulate 40 ft with walker with no breaks to allow for increased mobility within  home.     Baseline  12/26: 29 ft with walker and WC follow with no rest breaks; 2/18: multiple trials previous session of >100 ft     Time  8    Period  Weeks    Status  Achieved      PT LONG TERM GOAL #4   Title  Patient will increase BLE gross strength to 4+/5 as to improve functional strength for independent gait, increased standing tolerance and increased ADL ability.    Baseline   4-/5 RLE, 10/15: 4/5 RLE 11/7: 4/5 RLE 3/3: 4/5     Time  8    Period  Weeks    Status  Deferred    Target Date  10/20/18      PT LONG TERM GOAL #5   Title  Patient will increase lower extremity functional scale to >40/80 to demonstrate improved functional mobility and increased tolerance with ADLs.     Baseline  14/80; 12/26: 21/80; 2/18: 28/80  4/10; 25/80 5/29: 27/80; 7/16: 29/80 8/22: 30/80  9/12: 26/80 (feeling more tired due to moving) 10/15: 37/80 11/7: 23/80 3/3: 32/80; 08/25/18: 28/80    Time  8    Period  Weeks    Status  Partially Met    Target Date  10/20/18      Additional Long Term Goals   Additional Long Term Goals  Yes      PT LONG TERM GOAL #6   Title  Patient will walk through a door involving one small step up/down to increase functional independence.     Baseline  require CGA  BUE support in // bars at this time 10/15: required BUE and CGA in //bars; 08/25/18: Unsafe to attempt at this time    Time  8    Period  Weeks    Status  Deferred    Target Date  10/20/18      PT LONG TERM GOAL #7   Title  Patient will ambulate 40 ft with least assistive device and Supervision with no breaks to allow for increased mobility within home.     Baseline  requires use of walker and CGA     Time  8    Period  Weeks    Status  Achieved      PT LONG TERM GOAL #8   Title   Patient will perform 10 MWT in < 1 minute to demonstrate improved ambulatory velocity and capacity.     Baseline  4/10: 2 minutes 45 seconds; 5/29: 2 minute 31 seconds; 3 minutes 22 seconds 8/22: 3 minutes 2 seconds  9/12:  2 minutes and 43 seconds 10/15: 18mnute 12 seconds 11/7: 1 minute 21 seconds 12/19: 1 min 16 seconds 3/3: 57 seconds; 08/26/18: 1:41.4s= 0.10 m/s    Time  8    Period  Weeks    Status  Partially Met    Target Date  10/20/18      PT LONG TERM GOAL  #9   TITLE  Patient will increase Berg Balance score by > 6 points (26/56) to demonstrate decreased fall risk during functional activities.    Baseline  9/12: 20/56 10/15: 21/56 11/7: 20/56 12/19: 22/56; 08/26/18: 11/56    Time  8    Period  Weeks    Status  Partially Met    Target Date  10/20/18      PT LONG TERM GOAL  #10   TITLE  Patient (< 67years old) will complete five times sit  to stand test with full upright posture in < 30 seconds indicating an increased glute activation, LE strength and improved balance.    Baseline  10/15: 37seconds 11/7: 20 seconds     Time  8    Period  Weeks    Status  Achieved      PT LONG TERM GOAL  #11   TITLE  Patient will ambulate 100 ft with least assistive device and Supervision with no breaks to allow for increased mobility within home.     Baseline  10/17: 73f 11/7: 1011f   Time  8    Period  Weeks    Status  Achieved      PT LONG TERM GOAL  #12   TITLE  Patient (< 6040ears old) will complete five times sit to stand test with full upright posture in < 15 seconds indicating increased glute activation, LE strength and improved balance.    Baseline  11/7: 20 seconds 12/19: 18 seconds 3/3: 18 seconds; 08/25/18: [place].6s [place] and one on WC, 80% upright posture    Time  8    Period  Weeks    Status  Partially Met    Target Date  10/20/18      PT LONG TERM GOAL  #13   TITLE  Patient will ambulate 150 ft with least assistive device and Supervision with no breaks to  allow for increased mobility within home.     Baseline  11/7: 10074f2/19: 115 ft  3/3: will assess next session, has done 200 ft with one rest break; 08/26/18: Pt fatigued after 30' today    Time  8    Period  Weeks    Status  Partially Met    Target Date  10/20/18      PT LONG TERM GOAL  #14   TITLE  Patient will decrease 10 meter walk test to <30 seconds as to improve gait speed for better community ambulation and to reduce fall risk.    Baseline  3/3: 57 seconds with RW and CGA; 08/26/18: 1:41    Time  8    Period  Weeks    Status  On-going    Target Date  10/20/18            Plan - 09/08/18 1436    Clinical Impression Statement  Pt demonstrates good motivation during session today. He demonstrates increased ambulation distance today. Unfortunately his static balance in parallel bars is slightly worse today. He is again able to improve his posture with back against the wall and complete standing exercises with less reliance on bilateral UEs. Pt encouraged to continue his HEP and follow-up as scheduled. Pt will benefit from PT services to address deficits in strength, balance, and mobility in order to return to full function at home.     Rehab Potential  Fair    Clinical Impairments Affecting Rehab Potential  hx of HTN, HLD, CVA, learning disability, Diabetes, brain tumor,     PT Frequency  2x / week    PT Duration  8 weeks    PT Treatment/Interventions  ADLs/Self Care Home Management;Aquatic Therapy;Ultrasound;Moist Heat;Traction;DME Instruction;Gait training;Stair training;Functional mobility training;Therapeutic activities;Therapeutic exercise;Orthotic Fit/Training;Neuromuscular re-education;Balance training;Patient/family education;Manual techniques;Wheelchair mobility training;Passive range of motion;Energy conservation;Taping;Visual/perceptual remediation/compensation    PT Next Visit Plan  progress BLE strengthening, work on bed mobility/rolling, posture    PT Home Exercise Plan   TrA contraction, glute sets: needs updates. Potentially some time in supine  flat in hospital bed, to improve hip extension deficits.     Consulted and Agree with Plan of Care  Patient       Patient will benefit from skilled therapeutic intervention in order to improve the following deficits and impairments:  Abnormal gait, Decreased activity tolerance, Decreased balance, Decreased knowledge of precautions, Decreased endurance, Decreased coordination, Decreased knowledge of use of DME, Decreased mobility, Decreased range of motion, Difficulty walking, Decreased safety awareness, Decreased strength, Impaired flexibility, Impaired perceived functional ability, Impaired tone, Postural dysfunction, Improper body mechanics, Pain  Visit Diagnosis: Muscle weakness (generalized)  Other abnormalities of gait and mobility     Problem List Patient Active Problem List   Diagnosis Date Noted  . Acute CVA (cerebrovascular accident) ([place]) 05/02/2016  . Left-sided weakness 05/01/2016   Phillips Grout PT, DPT, GCS  Huprich,Jason 09/08/2018, 3:27 PM  [place] MAIN [facility] SERVICES [address] [place], [place], [address] Phone: [phone]   Fax:  [phone]  Name: KIAH KEAY MRN: [id number] Date of Birth: Sep 24, 1965

## 2018-09-10 ENCOUNTER — Encounter: Payer: Self-pay | Admitting: Occupational Therapy

## 2018-09-10 ENCOUNTER — Ambulatory Visit: Payer: Medicare HMO | Admitting: Occupational Therapy

## 2018-09-10 ENCOUNTER — Other Ambulatory Visit: Payer: Self-pay

## 2018-09-10 ENCOUNTER — Ambulatory Visit: Payer: Medicare HMO

## 2018-09-10 DIAGNOSIS — M6281 Muscle weakness (generalized): Secondary | ICD-10-CM

## 2018-09-10 DIAGNOSIS — R2689 Other abnormalities of gait and mobility: Secondary | ICD-10-CM

## 2018-09-10 DIAGNOSIS — R278 Other lack of coordination: Secondary | ICD-10-CM

## 2018-09-10 NOTE — Therapy (Signed)
[place] MAIN [facility] SERVICES [address] [place], [place], [address] Phone: [phone]   Fax:  [phone]  Physical Therapy Treatment  Patient Details  Name: Curtis Powell MRN: [id number] Date of Birth: Mar 09, 1966 No data recorded  Encounter Date: 09/10/2018  PT End of Session - 09/10/18 1456    Visit Number  114    Number of Visits  137    Date for PT Re-Evaluation  10/20/18    Authorization Type  last goals: 08/25/18    Authorization Time Period  authorized 05/12/18-11/10/18    PT Start Time  1429    PT Stop Time  1515    PT Time Calculation (min)  46 min    Equipment Utilized During Treatment  Gait belt    Activity Tolerance  Patient tolerated treatment well    Behavior During Therapy  WFL for tasks assessed/performed       Past Medical History:  Diagnosis Date  . GERD (gastroesophageal reflux disease)   . Hyperlipidemia   . Hypertension   . Obesity   . Stroke [facility])     Past Surgical History:  Procedure Laterality Date  . BRAIN SURGERY      There were no vitals filed for this visit.  Subjective Assessment - 09/10/18 1435    Subjective  Patient is doing well. Reports he has been compliant with his HEP and walking every day. No falls since last therapy session. No pain at this time.     Pertinent History   Patient is a pleasant 53 year old male who presents to physical therapy for weakness and immobility secondary to CVA.  Had a stroke in Feb 2018. He was previously fully independent, but this stroke caused severe residual deficits, mainly on the right side as well as speech, and now he is unable to walk or perform most of his ADLs on his own. Entire right side is very weak. He still has a little difficulty with speech but his swallowing is improved to baseline. His mom had to move in with him and now is his main caregiver.     Currently in Pain?  No/denies         Gait Training Ambulation with wheelchair follow and rolling  walker. Frequent cues for upright posture and to increase hip flexion to improve toe clearance during swing. 63' followed by rest break and then an additional 60';     Ther-ex  NuStep L2 x 5 minutes for warm-up during history, fatigue monitored;  Modified tandem stancewith each legshifting weight anteriorly to promote glute activationin front of mirror. Tactile cueing at ASIS and PSIS for appropriateanterior weightshift. CGA approximately 6 minutes   Stepping with tactile cueing to ASIS and PSIS for anterior weight shift and gluteal activation for step length, max verbal cueing for upright posture 2x length of bars, 2 trials, second trail more fatiguing however demonstrated improved upright position/trunk control    Terminated standing interventions due to excessive R knee hyperextension  Seated: Cross body UE, LE raises for cross body coordination, patient challenged with noted fatigue and limited coordination.  Seated adduction squeeze RTB 10x each LE (PT holding opp end to provide resistance) Seated RTB df 15x each LE,      Pt educated throughout session about proper posture and technique with exercises. Improved exercise technique, movement at target joints, use of target muscles after min to mod verbal, visual, tactile cues.    R knee hyperextending with fatigue.  PT Education - 09/10/18 1436    Education provided  Yes    Education Details  exercise technique, upright posture,     Person(s) Educated  Patient    Methods  Explanation;Demonstration;Tactile cues;Verbal cues    Comprehension  Verbalized understanding;Returned demonstration;Verbal cues required;Tactile cues required;Need further instruction       PT Short Term Goals - 08/25/18 1528      PT SHORT TERM GOAL #1   Title  Patient will be independent in home exercise program to improve strength/mobility for better functional independence with ADLs.    Baseline  hep  compliant    Time  2    Period  Weeks    Status  Achieved      PT SHORT TERM GOAL #2   Title  Patient will require min cueing for STS transfer with CGA for increased independence with mobility.     Baseline  CGA    Time  2    Period  Weeks    Status  Achieved      PT SHORT TERM GOAL #3   Title  Patient will maintain upright posture for > 15 seconds to demonstrate strengthened postural control muscualture     Baseline  18 seconds     Time  2    Period  Weeks    Status  Achieved        PT Long Term Goals - 08/25/18 1537      PT LONG TERM GOAL #1   Title  Patient will perform bed mobility with Supervision to increase independent mobility    Baseline  able to perform mod I for increased time , occasional Min A for scooting up in bed; 7/16: min guard for safety with increased effort and time, cues to scoot back in bed before sit>supine 9/12: can do it independently but with difficulty    Time  8    Period  Weeks    Status  Achieved      PT LONG TERM GOAL #2   Title  Patient (< 31 years old) will complete five times sit to stand test in < 10 seconds indicating an increased LE strength and improved balance.    Baseline  2/18: 30 seconds from plinth chair height single UE on seat and single on walker ; 32 seconds from plinth with single UE on seat and single on walker. 4/10: 26 seconds from [facility] with one hand on walker5/29: 22.2 seconds with one hand on walker one hand on WC; 7/16: one hand on walker one hand on WC 18.96 seconds; pt achieving 75% of full upright standing 8/22:one hand on walker one hand on WC 18 seconds; pt achieving 75% of full upright standing  9/12: 18 seconds econds; pt achieving 75% of full upright standing 8/22:one hand on walker one hand on WC 10/15: 17seconds; pt achieving 75% of full upright posture 11/7: 16 seconds; pt achieving 75 upright posture 12/19: 15 seconds 80% upright posture 3/3: 18 seconds 80% upright posture. 5/19: 20.6s from [place], one hand on walker and  one on WC, 80% upright posture    Time  8    Period  Weeks    Status  Partially Met    Target Date  10/20/18      PT LONG TERM GOAL #3   Title  Patient will ambulate 40 ft with walker with no breaks to allow for increased mobility within home.     Baseline  12/26: 29 ft with walker and  WC follow with no rest breaks; 2/18: multiple trials previous session of >100 ft     Time  8    Period  Weeks    Status  Achieved      PT LONG TERM GOAL #4   Title  Patient will increase BLE gross strength to 4+/5 as to improve functional strength for independent gait, increased standing tolerance and increased ADL ability.    Baseline   4-/5 RLE, 10/15: 4/5 RLE 11/7: 4/5 RLE 3/3: 4/5     Time  8    Period  Weeks    Status  Deferred    Target Date  10/20/18      PT LONG TERM GOAL #5   Title  Patient will increase lower extremity functional scale to >40/80 to demonstrate improved functional mobility and increased tolerance with ADLs.     Baseline  14/80; 12/26: 21/80; 2/18: 28/80  4/10; 25/80 5/29: 27/80; 7/16: 29/80 8/22: 30/80  9/12: 26/80 (feeling more tired due to moving) 10/15: 37/80 11/7: 23/80 3/3: 32/80; 08/25/18: 28/80    Time  8    Period  Weeks    Status  Partially Met    Target Date  10/20/18      Additional Long Term Goals   Additional Long Term Goals  Yes      PT LONG TERM GOAL #6   Title  Patient will walk through a door involving one small step up/down to increase functional independence.     Baseline  require CGA  BUE support in // bars at this time 10/15: required BUE and CGA in //bars; 08/25/18: Unsafe to attempt at this time    Time  8    Period  Weeks    Status  Deferred    Target Date  10/20/18      PT LONG TERM GOAL #7   Title  Patient will ambulate 40 ft with least assistive device and Supervision with no breaks to allow for increased mobility within home.     Baseline  requires use of walker and CGA     Time  8    Period  Weeks    Status  Achieved      PT LONG TERM  GOAL #8   Title  Patient will perform 10 MWT in < 1 minute to demonstrate improved ambulatory velocity and capacity.     Baseline  4/10: 2 minutes 45 seconds; 5/29: 2 minute 31 seconds; 3 minutes 22 seconds 8/22: 3 minutes 2 seconds  9/12:  2 minutes and 43 seconds 10/15: 17mnute 12 seconds 11/7: 1 minute 21 seconds 12/19: 1 min 16 seconds 3/3: 57 seconds; 08/26/18: 1:41.4s= 0.10 m/s    Time  8    Period  Weeks    Status  Partially Met    Target Date  10/20/18      PT LONG TERM GOAL  #9   TITLE  Patient will increase Berg Balance score by > 6 points (26/56) to demonstrate decreased fall risk during functional activities.    Baseline  9/12: 20/56 10/15: 21/56 11/7: 20/56 12/19: 22/56; 08/26/18: 11/56    Time  8    Period  Weeks    Status  Partially Met    Target Date  10/20/18      PT LONG TERM GOAL  #10   TITLE  Patient (< 619years old) will complete five times sit to stand test with full upright posture in < 30 seconds indicating an  increased glute activation, LE strength and improved balance.    Baseline  10/15: 37seconds 11/7: 20 seconds     Time  8    Period  Weeks    Status  Achieved      PT LONG TERM GOAL  #11   TITLE  Patient will ambulate 100 ft with least assistive device and Supervision with no breaks to allow for increased mobility within home.     Baseline  10/17: 31f 11/7: 109f   Time  8    Period  Weeks    Status  Achieved      PT LONG TERM GOAL  #12   TITLE  Patient (< [age]ears old) will complete five times sit to stand test with full upright posture in < 15 seconds indicating increased glute activation, LE strength and improved balance.    Baseline  11/7: 20 seconds 12/19: 18 seconds 3/3: 18 seconds; 08/25/18: 20.6s from [place] walker and one on WC, 80% upright posture    Time  8    Period  Weeks    Status  Partially Met    Target Date  10/20/18      PT LONG TERM GOAL  #13   TITLE  Patient will ambulate 150 ft with least assistive device and Supervision  with no breaks to allow for increased mobility within home.     Baseline  11/7: 1009f2/19: 115 ft  3/3: will assess next session, has done 200 ft with one rest break; 08/26/18: Pt fatigued after 30' today    Time  8    Period  Weeks    Status  Partially Met    Target Date  10/20/18      PT LONG TERM GOAL  #14   TITLE  Patient will decrease 10 meter walk test to <30 seconds as to improve gait speed for better community ambulation and to reduce fall risk.    Baseline  3/3: 57 seconds with RW and CGA; 08/26/18: 1:41    Time  8    Period  Weeks    Status  On-going    Target Date  10/20/18            Plan - 09/10/18 1505    Clinical Impression Statement  Patient presents with good motivation thorughout session. Has noted regression of trunk flexion with ambulation with increased reliance upon UE's. Tactile cueing to ASIS and PSIS for anterior shift and step length with upright posture. Noted R knee hyperextension with fatigue exacerbated with prolonged interventions.  Pt will benefit from PT services to address deficits in strength, balance, and mobility in order to return to full function at home.     Rehab Potential  Fair    Clinical Impairments Affecting Rehab Potential  hx of HTN, HLD, CVA, learning disability, Diabetes, brain tumor,     PT Frequency  2x / week    PT Duration  8 weeks    PT Treatment/Interventions  ADLs/Self Care Home Management;Aquatic Therapy;Ultrasound;Moist Heat;Traction;DME Instruction;Gait training;Stair training;Functional mobility training;Therapeutic activities;Therapeutic exercise;Orthotic Fit/Training;Neuromuscular re-education;Balance training;Patient/family education;Manual techniques;Wheelchair mobility training;Passive range of motion;Energy conservation;Taping;Visual/perceptual remediation/compensation    PT Next Visit Plan  progress BLE strengthening, work on bed mobility/rolling, posture    PT Home Exercise Plan  TrA contraction, glute sets: needs  updates. Potentially some time in supine flat in hospital bed, to improve hip extension deficits.     Consulted and Agree with Plan of Care  Patient  Patient will benefit from skilled therapeutic intervention in order to improve the following deficits and impairments:  Abnormal gait, Decreased activity tolerance, Decreased balance, Decreased knowledge of precautions, Decreased endurance, Decreased coordination, Decreased knowledge of use of DME, Decreased mobility, Decreased range of motion, Difficulty walking, Decreased safety awareness, Decreased strength, Impaired flexibility, Impaired perceived functional ability, Impaired tone, Postural dysfunction, Improper body mechanics, Pain  Visit Diagnosis: Muscle weakness (generalized)  Other lack of coordination  Other abnormalities of gait and mobility     Problem List Patient Active Problem List   Diagnosis Date Noted  . Acute CVA (cerebrovascular accident) ([place]) 05/02/2016  . Left-sided weakness 05/01/2016   Janna Arch, PT, DPT   09/10/2018, 3:24 PM  [facility] MAIN [facility] SERVICES [address] [place], [place], [address] Phone: [phone]   Fax:  [phone]  Name: GURNEY BALTHAZOR MRN: [id number] Date of Birth: 08-11-1965

## 2018-09-10 NOTE — Therapy (Signed)
[place] MAIN [facility] SERVICES [address] [place], [place], [address] Phone: [phone]   Fax:  [phone]  Occupational Therapy Progress Note  Dates of reporting period 04/23/2018   to   09/10/2018  Patient Details  Name: Curtis Powell MRN: [id number] Date of Birth: [birth year] No data recorded  Encounter Date: 09/10/2018  OT End of Session - 09/10/18 1537    Visit Number  100    Number of Visits  113    Date for OT Re-Evaluation  11/17/18    Authorization Type  Progress reporting period starting 04/23/2018    OT Start Time  1525    OT Stop Time  1610    OT Time Calculation (min)  45 min    Equipment Utilized During Treatment  Long handled shoe horn    Activity Tolerance  Patient tolerated treatment well    Behavior During Therapy  WFL for tasks assessed/performed       Past Medical History:  Diagnosis Date  . GERD (gastroesophageal reflux disease)   . Hyperlipidemia   . Hypertension   . Obesity   . Stroke [facility])     Past Surgical History:  Procedure Laterality Date  . BRAIN SURGERY      There were no vitals filed for this visit.  Subjective Assessment - 09/10/18 1536    Subjective   Pt. reports that he feels good today.    Pertinent History  Pt. is a 53 y.o. male who suffered a CVA on 05/01/2016. Pt. was admitted to the hospital. Once discharged, he received Home Health PT and OT services for about a month. Pt. has had multiple CVAs over the past 8 years, and has had multiple falls in the past 6 months. Pt. resides in an apartment. Pt. has caregivers for 80 hours. Pt.'s mother stays with pt. at night, and assists with IADL tasks.      Patient Stated Goals  To be able to throw a ball and dribble a ball, do as much as I can for myself.     Currently in Pain?  No/denies      OT TREATMENT    There. Ex:  Pt. worked on [place] reaching using the [employer]. Pt. Grasped and moved the shapes through 4 vertical dowels of varying  heights.  Selfcare:  Pt. performed toileting skills with CGA standing at the toilet.  MinA to hike pants. Independent for hand hygiene seated from w/c height. Pt. worked on applying the right w/c legrest.  Response to Treatment:  Pt. With improving ability to apply the legrest. Pt. Had more difficulty removing the right leg rest. Pt. required cues, and visual demonstration for proper w/c legrest application.                           OT Education - 09/10/18 1537    Education provided  Yes    Education Details  reaching     [name]) Educated  Patient    Methods  Explanation;Demonstration    Comprehension  Verbalized understanding;Returned demonstration;Verbal cues required    Person(s) Educated  Patient    Methods  Explanation;Demonstration;Tactile cues;Verbal cues    Comprehension  Verbalized understanding;Returned demonstration;Verbal cues required;Need further instruction       OT Short Term Goals - 03/03/17 1459      OT SHORT TERM GOAL #1   Title  `        OT Long Term  Goals - 09/10/18 1544      OT LONG TERM GOAL #4   Title  Pt. will perform self dressing with minA and A/E as needed.    Baseline  Pt. continues to improve LB dressing in standing. Pt.  continues to require increased time and mod A donn socks, and pants over his feet. CGA standing to hike clothing. Pt. requires min A donning a jacket, and Independent doning a shirt.    Time  12    Period  Weeks    Status  On-going    Target Date  11/17/18      OT LONG TERM GOAL #6   Title  Pt. will write his name efficiently with 100% legibility    Baseline  Pt continues to present with limited legibility and efficiency when writing name to sign in when riding the [place]    Time  12    Period  Weeks    Status  On-going    Target Date  11/17/18      OT LONG TERM GOAL #7   Title  Pt will independently and consistently follow HEP to increase UE strength to increase functional independence     Baseline  Pt to continues to work towards independence with exercises    Time  12    Period  Weeks    Status  On-going    Target Date  11/17/18      OT LONG TERM GOAL #8   Title  Pt. will require supervision ironing a shirt.    Baseline  Pt. requires minA, and complete set-up seated using a tabletop iron    Time  12    Period  Weeks    Status  On-going    Target Date  11/17/18      OT LONG TERM GOAL  #9   Baseline  Pt. will independently be able to sew a button onto a shirt.    Time  12    Period  Weeks    Status  On-going    Target Date  11/17/18      OT LONG TERM GOAL  #10   TITLE  Pt. will independently be able to throw a ball    Baseline  Pt. is unable.    Time  12    Period  Weeks    Status  On-going    Target Date  11/17/18      OT LONG TERM GOAL  #11   TITLE  Pt. will improve UE functional reaching to be able to independently use his RUE to hand his clothes in the closest.    Baseline  Pt. continues to have difficulty hanging his clothes up in his closet.    Time  12    Period  Weeks    Status  On-going    Target Date  11/17/18            Plan - 09/10/18 1539    Clinical Impression Statement  Pt. is making progress overall. Pt. is now able to use his right UE more during daily ADL, and IADL tasks at home. Pt. continues to present with limited RUE strength, impaired motor control, and [place] [place] skills. Pt. continues to work on improving UE functioning in order to improve ADL functioning.    Occupational performance deficits (Please refer to evaluation for details):  ADL's;IADL's    Rehab Potential  Fair    Clinical Decision Making  Several treatment options, min-mod task modification necessary  OT Frequency  2x / week    OT Duration  12 weeks    OT Treatment/Interventions  Self-care/ADL training;Patient/family education;DME and/or AE instruction;Therapeutic exercise;Moist Heat;Neuromuscular education    Consulted and Agree with Plan of Care  Patient        Patient will benefit from skilled therapeutic intervention in order to improve the following deficits and impairments:     Visit Diagnosis: Muscle weakness (generalized)    Problem List Patient Active Problem List   Diagnosis Date Noted  . Acute CVA (cerebrovascular accident) ([place]) 05/02/2016  . Left-sided weakness 05/01/2016    Harrel Carina, MS, OTR/L 09/10/2018, 6:48 PM  [place] MAIN [facility] SERVICES [address] [place], [place], [address] Phone: [phone]   Fax:  [phone]  Name: Curtis Powell MRN: [id number] Date of Birth: [birth year]

## 2018-09-14 ENCOUNTER — Other Ambulatory Visit: Payer: Self-pay

## 2018-09-14 ENCOUNTER — Encounter: Payer: Self-pay | Admitting: Occupational Therapy

## 2018-09-14 ENCOUNTER — Ambulatory Visit: Payer: Medicare HMO | Admitting: Occupational Therapy

## 2018-09-14 ENCOUNTER — Ambulatory Visit: Payer: Medicare HMO

## 2018-09-14 DIAGNOSIS — M6281 Muscle weakness (generalized): Secondary | ICD-10-CM

## 2018-09-14 DIAGNOSIS — R2689 Other abnormalities of gait and mobility: Secondary | ICD-10-CM

## 2018-09-14 DIAGNOSIS — R278 Other lack of coordination: Secondary | ICD-10-CM

## 2018-09-14 NOTE — Therapy (Signed)
[place] MAIN [facility] SERVICES [address] [place], [place], [address] Phone: [phone]   Fax:  [phone]  Occupational Therapy Treatment  Patient Details  Name: Curtis Powell MRN: [id number] Date of Birth: [birth year] No data recorded  Encounter Date: 09/14/2018  OT End of Session - 09/14/18 1342    Visit Number  101    Number of Visits  113    Date for OT Re-Evaluation  11/17/18    Authorization Type  Progress reporting period starting 04/23/2018    OT Start Time  1330    OT Stop Time  1415    OT Time Calculation (min)  45 min    Equipment Utilized During Treatment  Long handled shoe horn    Activity Tolerance  Patient tolerated treatment well    Behavior During Therapy  WFL for tasks assessed/performed       Past Medical History:  Diagnosis Date  . GERD (gastroesophageal reflux disease)   . Hyperlipidemia   . Hypertension   . Obesity   . Stroke [facility])     Past Surgical History:  Procedure Laterality Date  . BRAIN SURGERY      There were no vitals filed for this visit.  Subjective Assessment - 09/14/18 1341    Subjective   Pt. reports that he feels good today.    Pertinent History  Pt. is a 53 y.o. male who suffered a CVA on 05/01/2016. Pt. was admitted to the hospital. Once discharged, he received Home Health PT and OT services for about a month. Pt. has had multiple CVAs over the past 8 years, and has had multiple falls in the past 6 months. Pt. resides in an apartment. Pt. has caregivers for 80 hours. Pt.'s mother stays with pt. at night, and assists with IADL tasks.      Patient Stated Goals  To be able to throw a ball and dribble a ball, do as much as I can for myself.     Currently in Pain?  No/denies      OT TREATMENT    Neuro muscular re-education:  Pt. worked on reaching with the RUE using the shape tower. Pt. grasped and moved the shapes through 4 vertical dowels of varying heights.  Selfcare:  Pt. worked on  the IADL task of ironing. Pt. required verbal cues, and assist to position, and set up the pants flat on the ironing board. Pt. required increased time to complete, and to iron thoroughly. Pt. Worked on practicing to pt.  Pants on a hanger.                         OT Education - 09/14/18 1342    Education provided  Yes    Education Details  reaching     [name]) Educated  Patient    Methods  Explanation;Demonstration    Comprehension  Verbalized understanding;Returned demonstration;Verbal cues required       OT Short Term Goals - 03/03/17 1459      OT SHORT TERM GOAL #1   Title  `        OT Long Term Goals - 09/10/18 1544      OT LONG TERM GOAL #4   Title  Pt. will perform self dressing with minA and A/E as needed.    Baseline  Pt. continues to improve LB dressing in standing. Pt.  continues to require increased time and mod A donn socks, and pants  over his feet. CGA standing to hike clothing. Pt. requires min A donning a jacket, and Independent doning a shirt.    Time  12    Period  Weeks    Status  On-going    Target Date  11/17/18      OT LONG TERM GOAL #6   Title  Pt. will write his name efficiently with 100% legibility    Baseline  Pt continues to present with limited legibility and efficiency when writing name to sign in when riding the [place]    Time  12    Period  Weeks    Status  On-going    Target Date  11/17/18      OT LONG TERM GOAL #7   Title  Pt will independently and consistently follow HEP to increase UE strength to increase functional independence    Baseline  Pt to continues to work towards independence with exercises    Time  12    Period  Weeks    Status  On-going    Target Date  11/17/18      OT LONG TERM GOAL #8   Title  Pt. will require supervision ironing a shirt.    Baseline  Pt. requires minA, and complete set-up seated using a tabletop iron    Time  12    Period  Weeks    Status  On-going    Target Date  11/17/18       OT LONG TERM GOAL  #9   Baseline  Pt. will independently be able to sew a button onto a shirt.    Time  12    Period  Weeks    Status  On-going    Target Date  11/17/18      OT LONG TERM GOAL  #10   TITLE  Pt. will independently be able to throw a ball    Baseline  Pt. is unable.    Time  12    Period  Weeks    Status  On-going    Target Date  11/17/18      OT LONG TERM GOAL  #11   TITLE  Pt. will improve UE functional reaching to be able to independently use his RUE to hand his clothes in the closest.    Baseline  Pt. continues to have difficulty hanging his clothes up in his closet.    Time  12    Period  Weeks    Status  On-going    Target Date  11/17/18            Plan - 09/14/18 1343    Clinical Impression Statement Upon arrival to the waiting area, pt. Was using the bathroom. Pt. Had difficulty opening the door to come out of the bathroom from his w/c height. Pt. continues to present with limited RUE ROM, functional reaching, UE strength, and [facility] skills. Pt. continues to work on improving RUE ROM,  and overall BUE functioning in order to improve ADL, and IADL functioning.    Occupational performance deficits (Please refer to evaluation for details):  ADL's;IADL's    Rehab Potential  Fair    Clinical Decision Making  Several treatment options, min-mod task modification necessary    OT Frequency  2x / week    OT Treatment/Interventions  Self-care/ADL training;Patient/family education;DME and/or AE instruction;Therapeutic exercise;Moist Heat;Neuromuscular education    Consulted and Agree with Plan of Care  Patient       Patient will benefit from  skilled therapeutic intervention in order to improve the following deficits and impairments:     Visit Diagnosis: Muscle weakness (generalized)  Other lack of coordination    Problem List Patient Active Problem List   Diagnosis Date Noted  . Acute CVA (cerebrovascular accident) ([place]) 05/02/2016  . Left-sided  weakness 05/01/2016    Harrel Carina, MS, OTR/L 09/14/2018, 1:51 PM  [place] MAIN [facility] SERVICES [address] [place], [place], [address] Phone: [phone]   Fax:  [phone]  Name: Curtis Powell MRN: [id number] Date of Birth: 11-02-1965

## 2018-09-14 NOTE — Therapy (Signed)
[place] MAIN [facility] SERVICES [address] [place], [place], [address] Phone: [phone]   Fax:  [phone]  Physical Therapy Treatment  Patient Details  Name: Curtis Powell MRN: [id number] Date of Birth: 03/14/1966 No data recorded  Encounter Date: 09/14/2018  PT End of Session - 09/14/18 1525    Visit Number  115    Number of Visits  137    Date for PT Re-Evaluation  10/20/18    Authorization Type  last goals: 08/25/18    Authorization Time Period  authorized 05/12/18-11/10/18    PT Start Time  1430    PT Stop Time  1518    PT Time Calculation (min)  48 min    Equipment Utilized During Treatment  Gait belt    Activity Tolerance  Patient tolerated treatment well    Behavior During Therapy  [facility] for tasks assessed/performed       Past Medical History:  Diagnosis Date  . GERD (gastroesophageal reflux disease)   . Hyperlipidemia   . Hypertension   . Obesity   . Stroke [facility])     Past Surgical History:  Procedure Laterality Date  . BRAIN SURGERY      There were no vitals filed for this visit.  Subjective Assessment - 09/14/18 1525    Subjective  Patient is doing well. Reports he has been compliant with his HEP. No falls since last therapy session. No pain at this time.     Pertinent History   Patient is a pleasant 53 year old male who presents to physical therapy for weakness and immobility secondary to CVA.  Had a stroke in Feb 2018. He was previously fully independent, but this stroke caused severe residual deficits, mainly on the right side as well as speech, and now he is unable to walk or perform most of his ADLs on his own. Entire right side is very weak. He still has a little difficulty with speech but his swallowing is improved to baseline. His mom had to move in with him and now is his main caregiver.     Currently in Pain?  No/denies                   TREATMENT   Gait Training Ambulation with wheelchair  follow and rolling walker. Frequent cues for upright posture and to increase hip flexion to improve toe clearance during swing. 16' followed by rest break and then an additional 104';   Ther-ex Standing balance with back again wall practicing upright posture 1 minute x 2, cues for scapular and cervical retraction to improve standing posture; Standingbilateralheel raises with back against the wall for improved upright posture and no UE support 2 x 10; Standing wall mini squats with back against wall for improved upright posture and no UE support 2 x 10; Standing marches with back against the wall for improved upright posture, bilateral UE support 2 x 10; Standing balance practice without UE support away from the wall while standing inside of walker x 1 minute, extensive verbal and tactile cues for anterior weight shifting and upright posture. Seated marches with contralateral arm lifts for coordination, patient challenged with noted fatigue and limited coordination 2 x 10 bilateral;.  Seated clams with manual resistance 2 x 10; Seated adductor squeeze with manual resistance 2 x 10; NuStep alternating resistance L0 x 2 minutes L0, L2 x 30s, L0 x 1 minute, L4 x 30s, L2 x 30s, L0 x 30s;   Pt  educated throughout session about proper posture and technique with exercises. Improved exercise technique, movement at target joints, use of target muscles after min to mod verbal, visual, tactile cues.   Pt demonstrates good motivation during session today. Hedemonstrates significant increase in ambulation distance today. However he requires continual cues for upright posture when walking. He is less fatigued during session today. His standing static balance without UE support is improved as well. He is again able to improve his posture with back against the wall and complete standing exercises with less reliance on bilateral UEs. Pt encouraged to continue his HEP and follow-up as scheduled.Pt will  benefit from PT services to address deficits in strength, balance, and mobility in order to return to full function at home.              PT Short Term Goals - 08/25/18 1528      PT SHORT TERM GOAL #1   Title  Patient will be independent in home exercise program to improve strength/mobility for better functional independence with ADLs.    Baseline  hep compliant    Time  2    Period  Weeks    Status  Achieved      PT SHORT TERM GOAL #2   Title  Patient will require min cueing for STS transfer with CGA for increased independence with mobility.     Baseline  CGA    Time  2    Period  Weeks    Status  Achieved      PT SHORT TERM GOAL #3   Title  Patient will maintain upright posture for > 15 seconds to demonstrate strengthened postural control muscualture     Baseline  18 seconds     Time  2    Period  Weeks    Status  Achieved        PT Long Term Goals - 08/25/18 1537      PT LONG TERM GOAL #1   Title  Patient will perform bed mobility with Supervision to increase independent mobility    Baseline  able to perform mod I for increased time , occasional Min A for scooting up in bed; 7/16: min guard for safety with increased effort and time, cues to scoot back in bed before sit>supine 9/12: can do it independently but with difficulty    Time  8    Period  Weeks    Status  Achieved      PT LONG TERM GOAL #2   Title  Patient (< 48 years old) will complete five times sit to stand test in < 10 seconds indicating an increased LE strength and improved balance.    Baseline  2/18: 30 seconds from plinth chair height single UE on seat and single on walker ; 32 seconds from plinth with single UE on seat and single on walker. 4/10: 26 seconds from [facility] with one hand on walker5/29: 22.2 seconds with one hand on walker one hand on WC; 7/16: one hand on walker one hand on WC 18.96 seconds; pt achieving 75% of full upright standing 8/22:one hand on walker one hand on WC 18 seconds; pt  achieving 75% of full upright standing  9/12: 18 seconds econds; pt achieving 75% of full upright standing 8/22:one hand on walker one hand on WC 10/15: 17seconds; pt achieving 75% of full upright posture 11/7: 16 seconds; pt achieving 75 upright posture 12/19: 15 seconds 80% upright posture 3/3: 18 seconds 80% upright posture. 5/19:  20.6s from [facility], one hand on walker and one on WC, 80% upright posture    Time  8    Period  Weeks    Status  Partially Met    Target Date  10/20/18      PT LONG TERM GOAL #3   Title  Patient will ambulate 40 ft with walker with no breaks to allow for increased mobility within home.     Baseline  12/26: 29 ft with walker and WC follow with no rest breaks; 2/18: multiple trials previous session of >100 ft     Time  8    Period  Weeks    Status  Achieved      PT LONG TERM GOAL #4   Title  Patient will increase BLE gross strength to 4+/5 as to improve functional strength for independent gait, increased standing tolerance and increased ADL ability.    Baseline   4-/5 RLE, 10/15: 4/5 RLE 11/7: 4/5 RLE 3/3: 4/5     Time  8    Period  Weeks    Status  Deferred    Target Date  10/20/18      PT LONG TERM GOAL #5   Title  Patient will increase lower extremity functional scale to >40/80 to demonstrate improved functional mobility and increased tolerance with ADLs.     Baseline  14/80; 12/26: 21/80; 2/18: 28/80  4/10; 25/80 5/29: 27/80; 7/16: 29/80 8/22: 30/80  9/12: 26/80 (feeling more tired due to moving) 10/15: 37/80 11/7: 23/80 3/3: 32/80; 08/25/18: 28/80    Time  8    Period  Weeks    Status  Partially Met    Target Date  10/20/18      Additional Long Term Goals   Additional Long Term Goals  Yes      PT LONG TERM GOAL #6   Title  Patient will walk through a door involving one small step up/down to increase functional independence.     Baseline  require CGA  BUE support in // bars at this time 10/15: required BUE and CGA in //bars; 08/25/18: Unsafe to attempt at  this time    Time  8    Period  Weeks    Status  Deferred    Target Date  10/20/18      PT LONG TERM GOAL #7   Title  Patient will ambulate 40 ft with least assistive device and Supervision with no breaks to allow for increased mobility within home.     Baseline  requires use of walker and CGA     Time  8    Period  Weeks    Status  Achieved      PT LONG TERM GOAL #8   Title  Patient will perform 10 MWT in < 1 minute to demonstrate improved ambulatory velocity and capacity.     Baseline  4/10: 2 minutes 45 seconds; 5/29: 2 minute 31 seconds; 3 minutes 22 seconds 8/22: 3 minutes 2 seconds  9/12:  2 minutes and 43 seconds 10/15: 72mnute 12 seconds 11/7: 1 minute 21 seconds 12/19: 1 min 16 seconds 3/3: 57 seconds; 08/26/18: 1:41.4s= 0.10 m/s    Time  8    Period  Weeks    Status  Partially Met    Target Date  10/20/18      PT LONG TERM GOAL  #9   TITLE  Patient will increase Berg Balance score by > 6 points (26/56) to demonstrate decreased fall  risk during functional activities.    Baseline  9/12: 20/56 10/15: 21/56 11/7: 20/56 12/19: 22/56; 08/26/18: 11/56    Time  8    Period  Weeks    Status  Partially Met    Target Date  10/20/18      PT LONG TERM GOAL  #10   TITLE  Patient (< 70 years old) will complete five times sit to stand test with full upright posture in < 30 seconds indicating an increased glute activation, LE strength and improved balance.    Baseline  10/15: 37seconds 11/7: 20 seconds     Time  8    Period  Weeks    Status  Achieved      PT LONG TERM GOAL  #11   TITLE  Patient will ambulate 100 ft with least assistive device and Supervision with no breaks to allow for increased mobility within home.     Baseline  10/17: 3f 11/7: 1072f   Time  8    Period  Weeks    Status  Achieved      PT LONG TERM GOAL  #12   TITLE  Patient (< 6021ears old) will complete five times sit to stand test with full upright posture in < 15 seconds indicating increased glute  activation, LE strength and improved balance.    Baseline  11/7: 20 seconds 12/19: 18 seconds 3/3: 18 seconds; 08[place]25/18: [place], 80% upright posture    Time  8    Period  Weeks    Status  Partially Met    Target Date  10/20/18      PT LONG TERM GOAL  #13   TITLE  Patient will ambulate 150 ft with least assistive device and Supervision with no breaks to allow for increased mobility within home.     Baseline  11/7: 10031f2/19: 115 ft  3/3: will assess next session, has done 200 ft with one rest break; 08/26/18: Pt fatigued after 30' today    Time  8    Period  Weeks    Status  Partially Met    Target Date  10/20/18      PT LONG TERM GOAL  #14   TITLE  Patient will decrease 10 meter walk test to <30 seconds as to improve gait speed for better community ambulation and to reduce fall risk.    Baseline  3/3: 57 seconds with RW and CGA; 08/26/18: 1:41    Time  8    Period  Weeks    Status  On-going    Target Date  10/20/18            Plan - 09/14/18 1515    Clinical Impression Statement  Pt demonstrates good motivation during session today. Hedemonstrates significant increase in ambulation distance today. However he requires continual cues for upright posture when walking. He is less fatigued during session today. His standing static balance without UE support is improved as well. He is again able to improve his posture with back against the wall and complete standing exercises with less reliance on bilateral UEs. Pt encouraged to continue his HEP and follow-up as scheduled.Pt will benefit from PT services to address deficits in strength, balance, and mobility in order to return to full function at home.    Rehab Potential  Fair    Clinical Impairments Affecting Rehab Potential  hx of HTN, HLD, CVA, learning disability, Diabetes, brain  tumor,     PT Frequency  2x / week    PT Duration  8 weeks    PT Treatment/Interventions  ADLs/Self Care Home  Management;Aquatic Therapy;Ultrasound;Moist Heat;Traction;DME Instruction;Gait training;Stair training;Functional mobility training;Therapeutic activities;Therapeutic exercise;Orthotic Fit/Training;Neuromuscular re-education;Balance training;Patient/family education;Manual techniques;Wheelchair mobility training;Passive range of motion;Energy conservation;Taping;Visual/perceptual remediation/compensation    PT Next Visit Plan  progress BLE strengthening, work on bed mobility/rolling, posture    PT Home Exercise Plan  TrA contraction, glute sets: needs updates. Potentially some time in supine flat in hospital bed, to improve hip extension deficits.     Consulted and Agree with Plan of Care  Patient       Patient will benefit from skilled therapeutic intervention in order to improve the following deficits and impairments:  Abnormal gait, Decreased activity tolerance, Decreased balance, Decreased knowledge of precautions, Decreased endurance, Decreased coordination, Decreased knowledge of use of DME, Decreased mobility, Decreased range of motion, Difficulty walking, Decreased safety awareness, Decreased strength, Impaired flexibility, Impaired perceived functional ability, Impaired tone, Postural dysfunction, Improper body mechanics, Pain  Visit Diagnosis: Muscle weakness (generalized)  Other abnormalities of gait and mobility     Problem List Patient Active Problem List   Diagnosis Date Noted  . Acute CVA (cerebrovascular accident) ([place]) 05/02/2016  . Left-sided weakness 05/01/2016   Phillips Grout PT, DPT, GCS  Huprich,Jason 09/14/2018, 3:27 PM  [place] MAIN [facility] SERVICES [address] [place], [place], [address] Phone: [phone]   Fax:  [phone]  Name: Curtis Powell MRN: [id number] Date of Birth: 1966/03/10

## 2018-09-16 ENCOUNTER — Ambulatory Visit: Payer: Medicare HMO | Admitting: Occupational Therapy

## 2018-09-21 ENCOUNTER — Ambulatory Visit: Payer: Medicare HMO

## 2018-09-21 ENCOUNTER — Other Ambulatory Visit: Payer: Self-pay

## 2018-09-21 DIAGNOSIS — R278 Other lack of coordination: Secondary | ICD-10-CM

## 2018-09-21 DIAGNOSIS — I69351 Hemiplegia and hemiparesis following cerebral infarction affecting right dominant side: Secondary | ICD-10-CM

## 2018-09-21 DIAGNOSIS — M6281 Muscle weakness (generalized): Secondary | ICD-10-CM | POA: Diagnosis not present

## 2018-09-21 DIAGNOSIS — R2689 Other abnormalities of gait and mobility: Secondary | ICD-10-CM

## 2018-09-21 NOTE — Therapy (Signed)
[place] MAIN [facility] SERVICES [address] [place], [place], [address] Phone: [phone]   Fax:  [phone]  Physical Therapy Treatment  Patient Details  Name: Curtis Powell MRN: [id number] Date of Birth: 1966-03-30 No data recorded  Encounter Date: 09/21/2018  PT End of Session - 09/21/18 1355    Visit Number  [id number]    Number of Visits  137    Date for PT Re-Evaluation  10/20/18    Authorization Type  last goals: 08/25/18    Authorization Time Period  authorized 05/12/18-11/10/18    PT Start Time  1345    PT Stop Time  1436    PT Time Calculation (min)  51 min    Equipment Utilized During Treatment  Gait belt    Activity Tolerance  Patient tolerated treatment well    Behavior During Therapy  [facility] for tasks assessed/performed       Past Medical History:  Diagnosis Date  . GERD (gastroesophageal reflux disease)   . Hyperlipidemia   . Hypertension   . Obesity   . Stroke [facility])     Past Surgical History:  Procedure Laterality Date  . BRAIN SURGERY      There were no vitals filed for this visit.  Subjective Assessment - 09/21/18 1354    Subjective  Patient reports no pain, has been compliant with his HEP. Reports he has gotten an ace bandage for when his knee buckles.    Pertinent History   Patient is a pleasant 53 year old male who presents to physical therapy for weakness and immobility secondary to CVA.  Had a stroke in Feb 2018. He was previously fully independent, but this stroke caused severe residual deficits, mainly on the right side as well as speech, and now he is unable to walk or perform most of his ADLs on his own. Entire right side is very weak. He still has a little difficulty with speech but his swallowing is improved to baseline. His mom had to move in with him and now is his main caregiver.     Currently in Pain?  No/denies          Gait Training Ambulation with wheelchair follow and rolling walker. Frequent cues for  upright posture and to increase hip flexion to improve toe clearance during swing. 110 ft (straight and turn without rest break) followed by a seated rest break and then an additional 68 ft with tactile cues to PSIS and ASIS for anterior weight shift/step length. HR >120 with ambulation, decreased with sitting rest break to functional range.      Ther-ex  NuStep L4 x 4 minutes for warm-up during history, fatigue monitored;     Stepping with tactile cueing to ASIS and PSIS for anterior weight shift and gluteal activation for step length, max verbal cueing for upright posture 6 x each LE with RW, much more challenging with RW than in // bars for patient.    Seated: Cross body UE, LE raises for cross body coordination, patient challenged with noted fatigue and limited coordination.      Toileting performed with CGA and assistance to pulling up drawers after using restroom. Patient able to pull drawers down and perform STS with CGA only however was unable to reach to pull pants back up without assistance.  Pt educated throughout session about proper posture and technique with exercises. Improved exercise technique, movement at target joints, use of target muscles after min to mod verbal, visual,  tactile cues.    R knee hyperextending with fatigue.                        PT Education - 09/21/18 1355    Education provided  Yes    Education Details  exercise technique, upright posture    Person(s) Educated  Patient    Methods  Explanation;Demonstration;Tactile cues;Verbal cues;Handout    Comprehension  Verbalized understanding;Returned demonstration;Verbal cues required;Tactile cues required       PT Short Term Goals - 08/25/18 1528      PT SHORT TERM GOAL #1   Title  Patient will be independent in home exercise program to improve strength/mobility for better functional independence with ADLs.    Baseline  hep compliant    Time  2    Period  Weeks    Status  Achieved       PT SHORT TERM GOAL #2   Title  Patient will require min cueing for STS transfer with CGA for increased independence with mobility.     Baseline  CGA    Time  2    Period  Weeks    Status  Achieved      PT SHORT TERM GOAL #3   Title  Patient will maintain upright posture for > 15 seconds to demonstrate strengthened postural control muscualture     Baseline  18 seconds     Time  2    Period  Weeks    Status  Achieved        PT Long Term Goals - 08/25/18 1537      PT LONG TERM GOAL #1   Title  Patient will perform bed mobility with Supervision to increase independent mobility    Baseline  able to perform mod I for increased time , occasional Min A for scooting up in bed; 7/16: min guard for safety with increased effort and time, cues to scoot back in bed before sit>supine 9/12: can do it independently but with difficulty    Time  8    Period  Weeks    Status  Achieved      PT LONG TERM GOAL #2   Title  Patient (< 41 years old) will complete five times sit to stand test in < 10 seconds indicating an increased LE strength and improved balance.    Baseline  2/18: 30 seconds from plinth chair height single UE on seat and single on walker ; 32 seconds from plinth with single UE on seat and single on walker. 4/10: 26 seconds from [facility] with one hand on walker5/29: 22.2 seconds with one hand on walker one hand on WC; 7/16: one hand on walker one hand on WC 18.96 seconds; pt achieving 75% of full upright standing 8/22:one hand on walker one hand on WC 18 seconds; pt achieving 75% of full upright standing  9/12: 18 seconds econds; pt achieving 75% of full upright standing 8/22:one hand on walker one hand on WC 10/15: 17seconds; pt achieving 75% of full upright posture 11/7: 16 seconds; pt achieving 75 upright posture 12/19: 15 seconds 80% upright posture 3/3: 18 seconds 80% upright posture. 5/19: 20.6s from [facility], one hand on walker and one on WC, 80% upright posture    Time  8    Period  Weeks     Status  Partially Met    Target Date  10/20/18      PT LONG TERM GOAL #3   Title  Patient will ambulate 40 ft with walker with no breaks to allow for increased mobility within home.     Baseline  12/26: 29 ft with walker and WC follow with no rest breaks; 2/18: multiple trials previous session of >100 ft     Time  8    Period  Weeks    Status  Achieved      PT LONG TERM GOAL #4   Title  Patient will increase BLE gross strength to 4+/5 as to improve functional strength for independent gait, increased standing tolerance and increased ADL ability.    Baseline   4-/5 RLE, 10/15: 4/5 RLE 11/7: 4/5 RLE 3/3: 4/5     Time  8    Period  Weeks    Status  Deferred    Target Date  10/20/18      PT LONG TERM GOAL #5   Title  Patient will increase lower extremity functional scale to >40/80 to demonstrate improved functional mobility and increased tolerance with ADLs.     Baseline  14/80; 12/26: 21/80; 2/18: 28/80  4/10; 25/80 5/29: 27/80; 7/16: 29/80 8/22: 30/80  9/12: 26/80 (feeling more tired due to moving) 10/15: 37/80 11/7: 23/80 3/3: 32/80; 08/25/18: 28/80    Time  8    Period  Weeks    Status  Partially Met    Target Date  10/20/18      Additional Long Term Goals   Additional Long Term Goals  Yes      PT LONG TERM GOAL #6   Title  Patient will walk through a door involving one small step up/down to increase functional independence.     Baseline  require CGA  BUE support in // bars at this time 10/15: required BUE and CGA in //bars; 08/25/18: Unsafe to attempt at this time    Time  8    Period  Weeks    Status  Deferred    Target Date  10/20/18      PT LONG TERM GOAL #7   Title  Patient will ambulate 40 ft with least assistive device and Supervision with no breaks to allow for increased mobility within home.     Baseline  requires use of walker and CGA     Time  8    Period  Weeks    Status  Achieved      PT LONG TERM GOAL #8   Title  Patient will perform 10 MWT in < 1 minute to  demonstrate improved ambulatory velocity and capacity.     Baseline  4/10: 2 minutes 45 seconds; 5/29: 2 minute 31 seconds; 3 minutes 22 seconds 8/22: 3 minutes 2 seconds  9/12:  2 minutes and 43 seconds 10/15: 60mnute 12 seconds 11/7: 1 minute 21 seconds 12/19: 1 min 16 seconds 3/3: 57 seconds; 08/26/18: 1:41.4s= 0.10 m/s    Time  8    Period  Weeks    Status  Partially Met    Target Date  10/20/18      PT LONG TERM GOAL  #9   TITLE  Patient will increase Berg Balance score by > 6 points (26/56) to demonstrate decreased fall risk during functional activities.    Baseline  9/12: 20/56 10/15: 21/56 11/7: 20/56 12/19: 22/56; 08/26/18: 11/56    Time  8    Period  Weeks    Status  Partially Met    Target Date  10/20/18      PT LONG TERM GOAL  #  10   TITLE  Patient (< 83 years old) will complete five times sit to stand test with full upright posture in < 30 seconds indicating an increased glute activation, LE strength and improved balance.    Baseline  10/15: 37seconds 11/7: 20 seconds     Time  8    Period  Weeks    Status  Achieved      PT LONG TERM GOAL  #11   TITLE  Patient will ambulate 100 ft with least assistive device and Supervision with no breaks to allow for increased mobility within home.     Baseline  10/17: 27f 11/7: 1022f   Time  8    Period  Weeks    Status  Achieved      PT LONG TERM GOAL  #12   TITLE  Patient (< [age]ears old) will complete five times sit to stand test with full upright posture in < 15 seconds indicating increased glute activation, LE strength and improved balance.    Baseline  11/7: 20 seconds 12/19: 18 seconds 3/3: 18 seconds; 08/25/18: [place] [facility] [place] and one on WC, 80% upright posture    Time  8    Period  Weeks    Status  Partially Met    Target Date  10/20/18      PT LONG TERM GOAL  #13   TITLE  Patient will ambulate 150 ft with least assistive device and Supervision with no breaks to allow for increased mobility within home.      Baseline  11/7: 10040f2/19: 115 ft  3/3: will assess next session, has done 200 ft with one rest break; 08/26/18: Pt fatigued after 30' today    Time  8    Period  Weeks    Status  Partially Met    Target Date  10/20/18      PT LONG TERM GOAL  #14   TITLE  Patient will decrease 10 meter walk test to <30 seconds as to improve gait speed for better community ambulation and to reduce fall risk.    Baseline  3/3: 57 seconds with RW and CGA; 08/26/18: 1:41    Time  8    Period  Weeks    Status  On-going    Target Date  10/20/18            Plan - 09/21/18 1447    Clinical Impression Statement  Patient continues to demonstrate good motivation and compliance with physical therapy session. Tactile cueing to ASIS and PSIS noticeably improve his gait mechanics with increasing step length and upright trunk posture with improved LE weight acceptance and decreased reliance upon RW. Patient is challenged with pulling his pants back on in standing after using restroom requiring assistance, indicating a new potential area to work on standing balance. Pt will benefit from PT services to address deficits in strength, balance, and mobility in order to return to full function at home.    Rehab Potential  Fair    Clinical Impairments Affecting Rehab Potential  hx of HTN, HLD, CVA, learning disability, Diabetes, brain tumor,     PT Frequency  2x / week    PT Duration  8 weeks    PT Treatment/Interventions  ADLs/Self Care Home Management;Aquatic Therapy;Ultrasound;Moist Heat;Traction;DME Instruction;Gait training;Stair training;Functional mobility training;Therapeutic activities;Therapeutic exercise;Orthotic Fit/Training;Neuromuscular re-education;Balance training;Patient/family education;Manual techniques;Wheelchair mobility training;Passive range of motion;Energy conservation;Taping;Visual/perceptual remediation/compensation    PT Next Visit Plan  standing stability with reach  to be able to pull up pants  independently    PT Home Exercise Plan  TrA contraction, glute sets: needs updates. Potentially some time in supine flat in hospital bed, to improve hip extension deficits.     Consulted and Agree with Plan of Care  Patient       Patient will benefit from skilled therapeutic intervention in order to improve the following deficits and impairments:  Abnormal gait, Decreased activity tolerance, Decreased balance, Decreased knowledge of precautions, Decreased endurance, Decreased coordination, Decreased knowledge of use of DME, Decreased mobility, Decreased range of motion, Difficulty walking, Decreased safety awareness, Decreased strength, Impaired flexibility, Impaired perceived functional ability, Impaired tone, Postural dysfunction, Improper body mechanics, Pain  Visit Diagnosis: Muscle weakness (generalized) - Plan:   Other abnormalities of gait and mobility - Plan:   Other lack of coordination - Plan:   Hemiplegia and hemiparesis following cerebral infarction affecting right dominant side ([place]) - Plan:     Problem List Patient Active Problem List   Diagnosis Date Noted  . Acute CVA (cerebrovascular accident) ([place]) 05/02/2016  . Left-sided weakness 05/01/2016   Janna Arch, PT, DPT   09/21/2018, 2:49 PM  [place] MAIN [facility] SERVICES [address] [place], [place], [address] Phone: [phone]   Fax:  [phone]  Name: ODARIUS DINES MRN: [id number] Date of Birth: August 04, 1965

## 2018-09-23 ENCOUNTER — Ambulatory Visit: Payer: Medicare HMO

## 2018-09-23 ENCOUNTER — Ambulatory Visit: Payer: Medicare HMO | Admitting: Occupational Therapy

## 2018-09-23 ENCOUNTER — Encounter: Payer: Self-pay | Admitting: Occupational Therapy

## 2018-09-23 ENCOUNTER — Other Ambulatory Visit: Payer: Self-pay

## 2018-09-23 DIAGNOSIS — R278 Other lack of coordination: Secondary | ICD-10-CM

## 2018-09-23 DIAGNOSIS — R2689 Other abnormalities of gait and mobility: Secondary | ICD-10-CM

## 2018-09-23 DIAGNOSIS — M6281 Muscle weakness (generalized): Secondary | ICD-10-CM

## 2018-09-23 NOTE — Therapy (Signed)
[place] MAIN [facility] SERVICES [address] [place], [place], [address] Phone: [phone]   Fax:  [phone]  Physical Therapy Treatment  Patient Details  Name: Curtis Powell MRN: [id number] Date of Birth: [birth year] No data recorded  Encounter Date: 09/23/2018  PT End of Session - 09/23/18 1424    Visit Number  [id number]    Number of Visits  137    Date for PT Re-Evaluation  10/20/18    Authorization Type  last goals: 08/25/18    Authorization Time Period  authorized 05/12/18-11/10/18    PT Start Time  0768    PT Stop Time  1459    PT Time Calculation (min)  44 min    Equipment Utilized During Treatment  Gait belt    Activity Tolerance  Patient tolerated treatment well;Patient limited by pain    Behavior During Therapy  WFL for tasks assessed/performed       Past Medical History:  Diagnosis Date  . GERD (gastroesophageal reflux disease)   . Hyperlipidemia   . Hypertension   . Obesity   . Stroke [facility])     Past Surgical History:  Procedure Laterality Date  . BRAIN SURGERY      There were no vitals filed for this visit.  Subjective Assessment - 09/23/18 1421    Subjective  Patient has 9/10 pain in R shoulder that began yesterday, he also is having trigger finger in L hand with pain occurring at night creating a 10/10 pain.    Pertinent History   Patient is a pleasant 53 year old male who presents to physical therapy for weakness and immobility secondary to CVA.  Had a stroke in Feb 2018. He was previously fully independent, but this stroke caused severe residual deficits, mainly on the right side as well as speech, and now he is unable to walk or perform most of his ADLs on his own. Entire right side is very weak. He still has a little difficulty with speech but his swallowing is improved to baseline. His mom had to move in with him and now is his main caregiver.     Currently in Pain?  Yes    Pain Score  9     Pain Location  Shoulder     Pain Orientation  Right    Pain Descriptors / Indicators  Aching    Pain Type  Acute pain    Aggravating Factors   pushing up, reaching, pressing up from wheelchair    Pain Relieving Factors  heat            Seated interventions.  5lb ankle weights  -hip flexion: 20x each leg, one leg at a time , focus on neutral hip alignment for maximal muscle contraction.  -long arc quad, 15x each LE focus on keeping steady velocity. 2 sets   -lateral step in outs 15x each LE GTB  -hamstring curls 15x each LE   -hip adduction 15x each LE, one leg at a time, PT holding opposite side of theraband for maximal resistance  -hip abduction 15x each LE, cueing for foot placemenet for maximal muscle recruitment.   Seated gluteal squeezes 15x 3 second holds 4" step quick toe taps for increasing velocity and coordination of movement 3x30 seconds.    Toileting performed with CGA and assistance to pulling up drawers after using restroom. Patient able to pull drawers down and perform STS with Min A due to pain in shoulder however was unable to  reach to pull pants back up without assistance.   Seated upright posture with TrA contraction 10x 3 second hold, cueing for breathing out  Seated spelling of name with toes each LE for coordination and strength.    Patient has 9/10 pain in R shoulder that began yesterday, he also is having trigger finger in L hand with pain occurring at night creating a 10/10 pain. All standing interventions deferred due to heavy use of UE's for transfer to standing. Patient performed seated interventions for LE strengthening and postural stability. Pt will benefit from PT services to address deficits in strength, balance, and mobility in order to return to full function at home.            PT Education - 09/23/18 1423    Education provided  Yes    Education Details  seated interventions, upright posture sitting    Person(s) Educated  Patient    Methods   Explanation;Demonstration;Tactile cues;Verbal cues    Comprehension  Verbalized understanding;Returned demonstration;Verbal cues required;Tactile cues required       PT Short Term Goals - 08/25/18 1528      PT SHORT TERM GOAL #1   Title  Patient will be independent in home exercise program to improve strength/mobility for better functional independence with ADLs.    Baseline  hep compliant    Time  2    Period  Weeks    Status  Achieved      PT SHORT TERM GOAL #2   Title  Patient will require min cueing for STS transfer with CGA for increased independence with mobility.     Baseline  CGA    Time  2    Period  Weeks    Status  Achieved      PT SHORT TERM GOAL #3   Title  Patient will maintain upright posture for > 15 seconds to demonstrate strengthened postural control muscualture     Baseline  18 seconds     Time  2    Period  Weeks    Status  Achieved        PT Long Term Goals - 08/25/18 1537      PT LONG TERM GOAL #1   Title  Patient will perform bed mobility with Supervision to increase independent mobility    Baseline  able to perform mod I for increased time , occasional Min A for scooting up in bed; 7/16: min guard for safety with increased effort and time, cues to scoot back in bed before sit>supine 9/12: can do it independently but with difficulty    Time  8    Period  Weeks    Status  Achieved      PT LONG TERM GOAL #2   Title  Patient (< 51 years old) will complete five times sit to stand test in < 10 seconds indicating an increased LE strength and improved balance.    Baseline  2/18: 30 seconds from plinth chair height single UE on seat and single on walker ; 32 seconds from plinth with single UE on seat and single on walker. 4/10: 26 seconds from [facility] with one hand on walker5/29: 22.2 seconds with one hand on walker one hand on WC; 7/16: one hand on walker one hand on WC 18.96 seconds; pt achieving 75% of full upright standing 8/22:one hand on walker one hand on WC  18 seconds; pt achieving 75% of full upright standing  9/12: 18 seconds econds; pt achieving 75% of  full upright standing 8/22:one hand on walker one hand on WC 10/15: 17seconds; pt achieving 75% of full upright posture 11/7: 16 seconds; pt achieving 75 upright posture 12/19: 15 seconds 80% upright posture 3/3: 18 seconds 80% upright posture. 5/19: 20.6s from [place], one hand on walker and one on WC, 80% upright posture    Time  8    Period  Weeks    Status  Partially Met    Target Date  10/20/18      PT LONG TERM GOAL #3   Title  Patient will ambulate 40 ft with walker with no breaks to allow for increased mobility within home.     Baseline  12/26: 29 ft with walker and WC follow with no rest breaks; 2/18: multiple trials previous session of >100 ft     Time  8    Period  Weeks    Status  Achieved      PT LONG TERM GOAL #4   Title  Patient will increase BLE gross strength to 4+/5 as to improve functional strength for independent gait, increased standing tolerance and increased ADL ability.    Baseline   4-/5 RLE, 10/15: 4/5 RLE 11/7: 4/5 RLE 3/3: 4/5     Time  8    Period  Weeks    Status  Deferred    Target Date  10/20/18      PT LONG TERM GOAL #5   Title  Patient will increase lower extremity functional scale to >40/80 to demonstrate improved functional mobility and increased tolerance with ADLs.     Baseline  14/80; 12/26: 21/80; 2/18: 28/80  4/10; 25/80 5/29: 27/80; 7/16: 29/80 8/22: 30/80  9/12: 26/80 (feeling more tired due to moving) 10/15: 37/80 11/7: 23/80 3/3: 32/80; 08/25/18: 28/80    Time  8    Period  Weeks    Status  Partially Met    Target Date  10/20/18      Additional Long Term Goals   Additional Long Term Goals  Yes      PT LONG TERM GOAL #6   Title  Patient will walk through a door involving one small step up/down to increase functional independence.     Baseline  require CGA  BUE support in // bars at this time 10/15: required BUE and CGA in //bars; 08/25/18: Unsafe  to attempt at this time    Time  8    Period  Weeks    Status  Deferred    Target Date  10/20/18      PT LONG TERM GOAL #7   Title  Patient will ambulate 40 ft with least assistive device and Supervision with no breaks to allow for increased mobility within home.     Baseline  requires use of walker and CGA     Time  8    Period  Weeks    Status  Achieved      PT LONG TERM GOAL #8   Title  Patient will perform 10 MWT in < 1 minute to demonstrate improved ambulatory velocity and capacity.     Baseline  4/10: 2 minutes 45 seconds; 5/29: 2 minute 31 seconds; 3 minutes 22 seconds 8/22: 3 minutes 2 seconds  9/12:  2 minutes and 43 seconds 10/15: 46mnute 12 seconds 11/7: 1 minute 21 seconds 12/19: 1 min 16 seconds 3/3: 57 seconds; 08/26/18: 1:41.4s= 0.10 m/s    Time  8    Period  Weeks  Status  Partially Met    Target Date  10/20/18      PT LONG TERM GOAL  #9   TITLE  Patient will increase Berg Balance score by > 6 points (26/56) to demonstrate decreased fall risk during functional activities.    Baseline  9/12: 20/56 10/15: 21/56 11/7: 20/56 12/19: 22/56; 08/26/18: 11/56    Time  8    Period  Weeks    Status  Partially Met    Target Date  10/20/18      PT LONG TERM GOAL  #10   TITLE  Patient (< 90 years old) will complete five times sit to stand test with full upright posture in < 30 seconds indicating an increased glute activation, LE strength and improved balance.    Baseline  10/15: 37seconds 11/7: 20 seconds     Time  8    Period  Weeks    Status  Achieved      PT LONG TERM GOAL  #11   TITLE  Patient will ambulate 100 ft with least assistive device and Supervision with no breaks to allow for increased mobility within home.     Baseline  10/17: 79f 11/7: 1054f   Time  8    Period  Weeks    Status  Achieved      PT LONG TERM GOAL  #12   TITLE  Patient (< [age]ears old) will complete five times sit to stand test with full upright posture in < 15 seconds indicating increased  glute activation, LE strength and improved balance.    Baseline  11/7: 20 seconds 12/19: 18 seconds 3/3: 18 seconds; 08/25/18: [place]s [place] and one on WC, 80% upright posture    Time  8    Period  Weeks    Status  Partially Met    Target Date  10/20/18      PT LONG TERM GOAL  #13   TITLE  Patient will ambulate 150 ft with least assistive device and Supervision with no breaks to allow for increased mobility within home.     Baseline  11/7: 10043f2/19: 115 ft  3/3: will assess next session, has done 200 ft with one rest break; 08/26/18: Pt fatigued after 30' today    Time  8    Period  Weeks    Status  Partially Met    Target Date  10/20/18      PT LONG TERM GOAL  #14   TITLE  Patient will decrease 10 meter walk test to <30 seconds as to improve gait speed for better community ambulation and to reduce fall risk.    Baseline  3/3: 57 seconds with RW and CGA; 08/26/18: 1:41    Time  8    Period  Weeks    Status  On-going    Target Date  10/20/18            Plan - 09/23/18 1441    Clinical Impression Statement  Patient has 9/10 pain in R shoulder that began yesterday, he also is having trigger finger in L hand with pain occurring at night creating a 10/10 pain. All standing interventions deferred due to heavy use of UE's for transfer to standing. Patient performed seated interventions for LE strengthening and postural stability. Pt will benefit from PT services to address deficits in strength, balance, and mobility in order to return to full function at home.    Rehab Potential  Fair    Clinical Impairments Affecting Rehab Potential  hx of HTN, HLD, CVA, learning disability, Diabetes, brain tumor,     PT Frequency  2x / week    PT Duration  8 weeks    PT Treatment/Interventions  ADLs/Self Care Home Management;Aquatic Therapy;Ultrasound;Moist Heat;Traction;DME Instruction;Gait training;Stair training;Functional mobility training;Therapeutic activities;Therapeutic  exercise;Orthotic Fit/Training;Neuromuscular re-education;Balance training;Patient/family education;Manual techniques;Wheelchair mobility training;Passive range of motion;Energy conservation;Taping;Visual/perceptual remediation/compensation    PT Next Visit Plan  standing stability with reach to be able to pull up pants independently    PT Home Exercise Plan  TrA contraction, glute sets: needs updates. Potentially some time in supine flat in hospital bed, to improve hip extension deficits.     Consulted and Agree with Plan of Care  Patient       Patient will benefit from skilled therapeutic intervention in order to improve the following deficits and impairments:  Abnormal gait, Decreased activity tolerance, Decreased balance, Decreased knowledge of precautions, Decreased endurance, Decreased coordination, Decreased knowledge of use of DME, Decreased mobility, Decreased range of motion, Difficulty walking, Decreased safety awareness, Decreased strength, Impaired flexibility, Impaired perceived functional ability, Impaired tone, Postural dysfunction, Improper body mechanics, Pain  Visit Diagnosis: 1. Muscle weakness (generalized)   2. Other lack of coordination   3. Other abnormalities of gait and mobility        Problem List Patient Active Problem List   Diagnosis Date Noted  . Acute CVA (cerebrovascular accident) ([place]) 05/02/2016  . Left-sided weakness 05/01/2016   Janna Arch, PT, DPT   09/23/2018, 3:03 PM  [place] MAIN [facility] SERVICES [address] [place], [place], [address] Phone: [phone]   Fax:  [phone]  Name: Curtis Powell MRN: [id number] Date of Birth: 1965/10/18

## 2018-09-23 NOTE — Therapy (Signed)
[place] MAIN [facility] SERVICES [address] [place], [place], [address] Phone: [phone]   Fax:  [phone]  Occupational Therapy Treatment  Patient Details  Name: Curtis Powell MRN: [id number] Date of Birth: 09-04-1965 No data recorded  Encounter Date: 09/23/2018  OT End of Session - 09/23/18 1358    Visit Number  102    Number of Visits  113    Date for OT Re-Evaluation  11/17/18    OT Start Time  1330    OT Stop Time  1415    OT Time Calculation (min)  45 min    Activity Tolerance  Patient tolerated treatment well    Behavior During Therapy  [facility] for tasks assessed/performed       Past Medical History:  Diagnosis Date  . GERD (gastroesophageal reflux disease)   . Hyperlipidemia   . Hypertension   . Obesity   . Stroke [facility])     Past Surgical History:  Procedure Laterality Date  . BRAIN SURGERY      There were no vitals filed for this visit.  Subjective Assessment - 09/23/18 1333    Subjective   Pt. reports that his left hand wont close    Currently in Pain?  Yes    Pain Score  9     Pain Location  Shoulder    Pain Orientation  Right    Pain Descriptors / Indicators  Aching    Pain Type  Chronic pain    Pain Onset  Yesterday    Pain Relieving Factors  Moving the arm       OT TREATMENT    Pt. tolerated moist heat modality to his right shoulder while performing [facility] skills.  Neuro muscular re-education:  Pt. performed [facility] tasks using the Grooved pegboard. Pt. worked on grasping the grooved pegs from a horizontal position, and moving the pegs to a vertical position in the hand to prepare for placing them in the grooved slot.   Response to treatment:  Pt. tolerated moist heat well. Left hand measurements: Digit flexion to the [facility]: 2nd: 1.5cm, 3rd: 2 cm, 4th: 2cm, 5th: 1cm                        OT Education - 09/23/18 1350    Education provided  Yes    Education Details  right hand [facility] skills     Person(s) Educated  Patient    Methods  Explanation;Demonstration    Comprehension  Verbalized understanding;Returned demonstration;Verbal cues required       OT Short Term Goals - 03/03/17 1459      OT SHORT TERM GOAL #1   Title  `        OT Long Term Goals - 09/10/18 1544      OT LONG TERM GOAL #4   Title  Pt. will perform self dressing with minA and A/E as needed.    Baseline  Pt. continues to improve LB dressing in standing. Pt.  continues to require increased time and mod A donn socks, and pants over his feet. CGA standing to hike clothing. Pt. requires min A donning a jacket, and Independent doning a shirt.    Time  12    Period  Weeks    Status  On-going    Target Date  11/17/18      OT LONG TERM GOAL #6   Title  Pt. will write his name efficiently with 100%  legibility    Baseline  Pt continues to present with limited legibility and efficiency when writing name to sign in when riding the [place]    Time  12    Period  Weeks    Status  On-going    Target Date  11/17/18      OT LONG TERM GOAL #7   Title  Pt will independently and consistently follow HEP to increase UE strength to increase functional independence    Baseline  Pt to continues to work towards independence with exercises    Time  12    Period  Weeks    Status  On-going    Target Date  11/17/18      OT LONG TERM GOAL #8   Title  Pt. will require supervision ironing a shirt.    Baseline  Pt. requires minA, and complete set-up seated using a tabletop iron    Time  12    Period  Weeks    Status  On-going    Target Date  11/17/18      OT LONG TERM GOAL  #9   Baseline  Pt. will independently be able to sew a button onto a shirt.    Time  12    Period  Weeks    Status  On-going    Target Date  11/17/18      OT LONG TERM GOAL  #10   TITLE  Pt. will independently be able to throw a ball    Baseline  Pt. is unable.    Time  12    Period  Weeks    Status  On-going    Target Date  11/17/18      OT  LONG TERM GOAL  #11   TITLE  Pt. will improve UE functional reaching to be able to independently use his RUE to hand his clothes in the closest.    Baseline  Pt. continues to have difficulty hanging his clothes up in his closet.    Time  12    Period  Weeks    Status  On-going    Target Date  11/17/18            Plan - 09/23/18 1358    Clinical Impression Statement  Pt. reports left 3rd digit pain which started over the weekeend. Pt. reports his pain is 10/10 at night. Pt. reports 9/10 right shoulder pain which is intermitent. Pt. reports that he is using his RUE more at home to make sandwiches. Pt. continues to work on improving UE functioning in order to improvie functional use during ADLs, and IADLs.    Occupational performance deficits (Please refer to evaluation for details):  ADL's;IADL's    Rehab Potential  Fair    OT Frequency  2x / week    OT Duration  12 weeks    OT Treatment/Interventions  Self-care/ADL training;Patient/family education;DME and/or AE instruction;Therapeutic exercise;Moist Heat;Neuromuscular education       Patient will benefit from skilled therapeutic intervention in order to improve the following deficits and impairments:           Visit Diagnosis: 1. Muscle weakness (generalized)   2. Other lack of coordination       Problem List Patient Active Problem List   Diagnosis Date Noted  . Acute CVA (cerebrovascular accident) ([place]) 05/02/2016  . Left-sided weakness 05/01/2016    Harrel Carina, MS, OTR/L 09/23/2018, 2:12 PM  [place] MAIN [facility] SERVICES [address]  [place], [place], [address] Phone: [phone]   Fax:  [phone]  Name: MEKIAH CAMBRIDGE MRN: [id number] Date of Birth: 08-Sep-1965

## 2018-09-28 ENCOUNTER — Ambulatory Visit: Payer: Medicare HMO

## 2018-09-28 ENCOUNTER — Ambulatory Visit: Payer: Medicare HMO | Admitting: Occupational Therapy

## 2018-09-28 ENCOUNTER — Encounter: Payer: Self-pay | Admitting: Occupational Therapy

## 2018-09-28 ENCOUNTER — Other Ambulatory Visit: Payer: Self-pay

## 2018-09-28 DIAGNOSIS — M6281 Muscle weakness (generalized): Secondary | ICD-10-CM

## 2018-09-28 DIAGNOSIS — R2689 Other abnormalities of gait and mobility: Secondary | ICD-10-CM

## 2018-09-28 DIAGNOSIS — R278 Other lack of coordination: Secondary | ICD-10-CM

## 2018-09-28 DIAGNOSIS — I69351 Hemiplegia and hemiparesis following cerebral infarction affecting right dominant side: Secondary | ICD-10-CM

## 2018-09-28 NOTE — Therapy (Signed)
[place] MAIN [facility] SERVICES [address] [place], [place], [address] Phone: [phone]   Fax:  [phone]  Occupational Therapy Treatment  Patient Details  Name: Curtis Powell MRN: [id number] Date of Birth: May 13, 1965 No data recorded  Encounter Date: 09/28/2018  OT End of Session - 09/28/18 1338    Visit Number  103    Number of Visits  113    Date for OT Re-Evaluation  11/17/18    OT Start Time  1330    OT Stop Time  1415    OT Time Calculation (min)  45 min    Equipment Utilized During Treatment  Long handled shoe horn    Activity Tolerance  Patient tolerated treatment well    Behavior During Therapy  WFL for tasks assessed/performed       Past Medical History:  Diagnosis Date  . GERD (gastroesophageal reflux disease)   . Hyperlipidemia   . Hypertension   . Obesity   . Stroke [facility])     Past Surgical History:  Procedure Laterality Date  . BRAIN SURGERY      There were no vitals filed for this visit.  Subjective Assessment - 09/28/18 1336    Subjective   Pt. reports that his right shoulder, and left hand feel better.    Pertinent History  Pt. is a 53 y.o. male who suffered a CVA on 05/01/2016. Pt. was admitted to the hospital. Once discharged, he received Home Health PT and OT services for about a month. Pt. has had multiple CVAs over the past 8 years, and has had multiple falls in the past 6 months. Pt. resides in an apartment. Pt. has caregivers for 80 hours. Pt.'s mother stays with pt. at night, and assists with IADL tasks.      Currently in Pain?  No/denies      OT TREATMENT    Neuro muscular re-education:  Pt. worked on reaching with his right UE using the [employer]. Pt. worked on moving the the [employer] through 3 horizontal rungs. Pt. Required cues, and set-up of the rings. Pt. Performed right [facility] skills training to improve speed and dexterity needed for ADL tasks and writing. Pt. demonstrated grasping 1 inch  sticks,  inch cylindrical collars, and  inch flat washers on the [employer] pegboard. Pt. had difficulty storing the pegs in the palm of his hand, and performing translatory movements with his right hand. Pt. presented with increased lateral leaning to the left during the task when placing the objects onto the [employer] Pegboard.                         OT Education - 09/28/18 1338    Education provided  Yes    Person(s) Educated  Patient    Methods  Explanation;Demonstration    Comprehension  Verbalized understanding;Returned demonstration;Verbal cues required       OT Short Term Goals - 03/03/17 1459      OT SHORT TERM GOAL #1   Title  `        OT Long Term Goals - 09/10/18 1544      OT LONG TERM GOAL #4   Title  Pt. will perform self dressing with minA and A/E as needed.    Baseline  Pt. continues to improve LB dressing in standing. Pt.  continues to require increased time and mod A donn socks, and pants over his feet. CGA standing to hike clothing.  Pt. requires min A donning a jacket, and Independent doning a shirt.    Time  12    Period  Weeks    Status  On-going    Target Date  11/17/18      OT LONG TERM GOAL #6   Title  Pt. will write his name efficiently with 100% legibility    Baseline  Pt continues to present with limited legibility and efficiency when writing name to sign in when riding the [place]    Time  12    Period  Weeks    Status  On-going    Target Date  11/17/18      OT LONG TERM GOAL #7   Title  Pt will independently and consistently follow HEP to increase UE strength to increase functional independence    Baseline  Pt to continues to work towards independence with exercises    Time  12    Period  Weeks    Status  On-going    Target Date  11/17/18      OT LONG TERM GOAL #8   Title  Pt. will require supervision ironing a shirt.    Baseline  Pt. requires minA, and complete set-up seated using a tabletop iron    Time  12    Period  Weeks     Status  On-going    Target Date  11/17/18      OT LONG TERM GOAL  #9   Baseline  Pt. will independently be able to sew a button onto a shirt.    Time  12    Period  Weeks    Status  On-going    Target Date  11/17/18      OT LONG TERM GOAL  #10   TITLE  Pt. will independently be able to throw a ball    Baseline  Pt. is unable.    Time  12    Period  Weeks    Status  On-going    Target Date  11/17/18      OT LONG TERM GOAL  #11   TITLE  Pt. will improve UE functional reaching to be able to independently use his RUE to hand his clothes in the closest.    Baseline  Pt. continues to have difficulty hanging his clothes up in his closet.    Time  12    Period  Weeks    Status  On-going    Target Date  11/17/18            Plan - 09/28/18 1349    Clinical Impression Statement  Pt. reports that his right shoulder, and left 3rd digit discomfort has improved, however pt. continues to report stiffness in his 3rd digit. Pt. continues to present with limited right shoulder ROM, and [name] skills. Pt. continues to work on improving BUE strength, and [place] skils in order to improve overall engagement in ADL, and IADL tasks.    Occupational performance deficits (Please refer to evaluation for details):  ADL's;IADL's    Rehab Potential  Fair    Clinical Decision Making  Several treatment options, min-mod task modification necessary    OT Frequency  2x / week    OT Duration  12 weeks    OT Treatment/Interventions  Self-care/ADL training;Patient/family education;DME and/or AE instruction;Therapeutic exercise;Moist Heat;Neuromuscular education    Consulted and Agree with Plan of Care  Patient       Patient will benefit from skilled therapeutic intervention in order  to improve the following deficits and impairments:           Visit Diagnosis: 1. Muscle weakness (generalized)   2. Other lack of coordination       Problem List Patient Active Problem List   Diagnosis Date Noted  .  Acute CVA (cerebrovascular accident) ([place]) 05/02/2016  . Left-sided weakness 05/01/2016    Harrel Carina, MS, OTR/L 09/28/2018, 1:59 PM  [place] MAIN [facility] SERVICES [address] [place], [place], [address] Phone: [phone]   Fax:  [phone]  Name: Curtis Powell MRN: [id number] Date of Birth: [birth year]

## 2018-09-28 NOTE — Therapy (Signed)
[place] MAIN [facility] SERVICES [address] [place], [place], [address] Phone: [phone]   Fax:  [phone]  Physical Therapy Treatment  Patient Details  Name: Curtis Powell MRN: [id number] Date of Birth: [birth year] No data recorded  Encounter Date: 09/28/2018  PT End of Session - 09/28/18 1421    Visit Number  118    Number of Visits  137    Date for PT Re-Evaluation  10/20/18    Authorization Type  last goals: 08/25/18    Authorization Time Period  authorized 05/12/18-11/10/18    PT Start Time  8315    PT Stop Time  1501    PT Time Calculation (min)  46 min    Equipment Utilized During Treatment  Gait belt    Activity Tolerance  Patient tolerated treatment well;Patient limited by pain    Behavior During Therapy  WFL for tasks assessed/performed       Past Medical History:  Diagnosis Date  . GERD (gastroesophageal reflux disease)   . Hyperlipidemia   . Hypertension   . Obesity   . Stroke [facility])     Past Surgical History:  Procedure Laterality Date  . BRAIN SURGERY      There were no vitals filed for this visit.  Subjective Assessment - 09/28/18 1420    Subjective  Patient reports his shoulder is feeling much better now. Went out to eat yesterday and over-ate. No falls or LOB since last session.    Pertinent History   Patient is a pleasant 53 year old male who presents to physical therapy for weakness and immobility secondary to CVA.  Had a stroke in Feb 2018. He was previously fully independent, but this stroke caused severe residual deficits, mainly on the right side as well as speech, and now he is unable to walk or perform most of his ADLs on his own. Entire right side is very weak. He still has a little difficulty with speech but his swallowing is improved to baseline. His mom had to move in with him and now is his main caregiver.     Currently in Pain?  No/denies        Gait Training Ambulation with wheelchair follow and  rolling walker. Frequent cues for upright posture and to increase hip flexion to improve toe clearance during swing. Ambulate 148 ft with tactile cues to PSIS and ASIS for anterior weight shift/step length. W/c follow with verbal cueing for head position to look ahead to decrease trunk flexion      Ther-ex  NuStep L4 x 4 minutes with RPM> 60 for cardiovascular challenge   Sit to stand 10x with max cueing for sequencing/gluteal squeeze, Min A to reduce "plopping" of poor eccentric control   Stepping with tactile cueing to ASIS and PSIS for anterior weight shift and gluteal activation for step length, max verbal cueing for upright posture 6 x each LE with RW, much more challenging with RW than in // bars for patient.    Seated: Cross body UE, LE raises for cross body coordination, patient challenged with noted fatigue and limited coordination.    GTB hamstring curl 10x each LE   Balloon taps inside and outside BOS reaches with reaction times 2 minutes.     Toileting performed with CGA and assistance to pulling up drawers after using restroom. Patient able to pull drawers down and perform STS with CGA only however was unable to reach to pull pants back up without assistance.  Pt educated throughout session about proper posture and technique with exercises. Improved exercise technique, movement at target joints, use of target muscles after min to mod verbal, visual, tactile cues.                            PT Education - 09/28/18 1420    Education provided  Yes    Education Details  exercise technique, stability    Person(s) Educated  Patient    Methods  Explanation;Demonstration;Tactile cues;Verbal cues    Comprehension  Verbalized understanding;Returned demonstration;Verbal cues required;Tactile cues required       PT Short Term Goals - 08/25/18 1528      PT SHORT TERM GOAL #1   Title  Patient will be independent in home exercise program to improve  strength/mobility for better functional independence with ADLs.    Baseline  hep compliant    Time  2    Period  Weeks    Status  Achieved      PT SHORT TERM GOAL #2   Title  Patient will require min cueing for STS transfer with CGA for increased independence with mobility.     Baseline  CGA    Time  2    Period  Weeks    Status  Achieved      PT SHORT TERM GOAL #3   Title  Patient will maintain upright posture for > 15 seconds to demonstrate strengthened postural control muscualture     Baseline  18 seconds     Time  2    Period  Weeks    Status  Achieved        PT Long Term Goals - 08/25/18 1537      PT LONG TERM GOAL #1   Title  Patient will perform bed mobility with Supervision to increase independent mobility    Baseline  able to perform mod I for increased time , occasional Min A for scooting up in bed; 7/16: min guard for safety with increased effort and time, cues to scoot back in bed before sit>supine 9/12: can do it independently but with difficulty    Time  8    Period  Weeks    Status  Achieved      PT LONG TERM GOAL #2   Title  Patient (< 59 years old) will complete five times sit to stand test in < 10 seconds indicating an increased LE strength and improved balance.    Baseline  2/18: 30 seconds from plinth chair height single UE on seat and single on walker ; 32 seconds from plinth with single UE on seat and single on walker. 4/10: 26 seconds from [facility] with one hand on walker5/29: 22.2 seconds with one hand on walker one hand on WC; 7/16: one hand on walker one hand on WC 18.96 seconds; pt achieving 75% of full upright standing 8/22:one hand on walker one hand on WC 18 seconds; pt achieving 75% of full upright standing  9/12: 18 seconds econds; pt achieving 75% of full upright standing 8/22:one hand on walker one hand on WC 10/15: 17seconds; pt achieving 75% of full upright posture 11/7: 16 seconds; pt achieving 75 upright posture 12/19: 15 seconds 80% upright posture  3/3: 18 seconds 80% upright posture. 5/19: 20.6s from [place], one hand on walker and one on WC, 80% upright posture    Time  8    Period  Weeks    Status  Partially Met    Target Date  10/20/18      PT LONG TERM GOAL #3   Title  Patient will ambulate 40 ft with walker with no breaks to allow for increased mobility within home.     Baseline  12/26: 29 ft with walker and WC follow with no rest breaks; 2/18: multiple trials previous session of >100 ft     Time  8    Period  Weeks    Status  Achieved      PT LONG TERM GOAL #4   Title  Patient will increase BLE gross strength to 4+/5 as to improve functional strength for independent gait, increased standing tolerance and increased ADL ability.    Baseline   4-/5 RLE, 10/15: 4/5 RLE 11/7: 4/5 RLE 3/3: 4/5     Time  8    Period  Weeks    Status  Deferred    Target Date  10/20/18      PT LONG TERM GOAL #5   Title  Patient will increase lower extremity functional scale to >40/80 to demonstrate improved functional mobility and increased tolerance with ADLs.     Baseline  14/80; 12/26: 21/80; 2/18: 28/80  4/10; 25/80 5/29: 27/80; 7/16: 29/80 8/22: 30/80  9/12: 26/80 (feeling more tired due to moving) 10/15: 37/80 11/7: 23/80 3/3: 32/80; 08/25/18: 28/80    Time  8    Period  Weeks    Status  Partially Met    Target Date  10/20/18      Additional Long Term Goals   Additional Long Term Goals  Yes      PT LONG TERM GOAL #6   Title  Patient will walk through a door involving one small step up/down to increase functional independence.     Baseline  require CGA  BUE support in // bars at this time 10/15: required BUE and CGA in //bars; 08/25/18: Unsafe to attempt at this time    Time  8    Period  Weeks    Status  Deferred    Target Date  10/20/18      PT LONG TERM GOAL #7   Title  Patient will ambulate 40 ft with least assistive device and Supervision with no breaks to allow for increased mobility within home.     Baseline  requires use of walker  and CGA     Time  8    Period  Weeks    Status  Achieved      PT LONG TERM GOAL #8   Title  Patient will perform 10 MWT in < 1 minute to demonstrate improved ambulatory velocity and capacity.     Baseline  4/10: 2 minutes 45 seconds; 5/29: 2 minute 31 seconds; 3 minutes 22 seconds 8/22: 3 minutes 2 seconds  9/12:  2 minutes and 43 seconds 10/15: 56mnute 12 seconds 11/7: 1 minute 21 seconds 12/19: 1 min 16 seconds 3/3: 57 seconds; 08/26/18: 1:41.4s= 0.10 m/s    Time  8    Period  Weeks    Status  Partially Met    Target Date  10/20/18      PT LONG TERM GOAL  #9   TITLE  Patient will increase Berg Balance score by > 6 points (26/56) to demonstrate decreased fall risk during functional activities.    Baseline  9/12: 20/56 10/15: 21/56 11/7: 20/56 12/19: 22/56; 08/26/18: 11/56    Time  8    Period  Weeks  Status  Partially Met    Target Date  10/20/18      PT LONG TERM GOAL  #10   TITLE  Patient (< 55 years old) will complete five times sit to stand test with full upright posture in < 30 seconds indicating an increased glute activation, LE strength and improved balance.    Baseline  10/15: 37seconds 11/7: 20 seconds     Time  8    Period  Weeks    Status  Achieved      PT LONG TERM GOAL  #11   TITLE  Patient will ambulate 100 ft with least assistive device and Supervision with no breaks to allow for increased mobility within home.     Baseline  10/17: 78f 11/7: 1026f   Time  8    Period  Weeks    Status  Achieved      PT LONG TERM GOAL  #12   TITLE  Patient (< [age]ears old) will complete five times sit to stand test with full upright posture in < 15 seconds indicating increased glute activation, LE strength and improved balance.    Baseline  11/7: 20 seconds 12/19: 18 seconds 3/3: 18 seconds; 08/25/18: 20.6s [place] and one on WC, 80% upright posture    Time  8    Period  Weeks    Status  Partially Met    Target Date  10/20/18      PT LONG TERM GOAL  #13    TITLE  Patient will ambulate 150 ft with least assistive device and Supervision with no breaks to allow for increased mobility within home.     Baseline  11/7: 10046f2/19: 115 ft  3/3: will assess next session, has done 200 ft with one rest break; 08/26/18: Pt fatigued after 30' today    Time  8    Period  Weeks    Status  Partially Met    Target Date  10/20/18      PT LONG TERM GOAL  #14   TITLE  Patient will decrease 10 meter walk test to <30 seconds as to improve gait speed for better community ambulation and to reduce fall risk.    Baseline  3/3: 57 seconds with RW and CGA; 08/26/18: 1:41    Time  8    Period  Weeks    Status  On-going    Target Date  10/20/18            Plan - 09/28/18 1509    Clinical Impression Statement  Patient presents with good motivation and no pain to his physical therapy session today. Utilized music with tactile and verbal cueing for gait mechanics with an improvement in velocity/muscle contractions with increased tempo of music. Patient performed STS transfer training with noted backward LOB requiring Min A to retain COM with repetition due to fatigue. Pt will benefit from PT services to address deficits in strength, balance, and mobility in order to return to full function at home.    Rehab Potential  Fair    Clinical Impairments Affecting Rehab Potential  hx of HTN, HLD, CVA, learning disability, Diabetes, brain tumor,     PT Frequency  2x / week    PT Duration  8 weeks    PT Treatment/Interventions  ADLs/Self Care Home Management;Aquatic Therapy;Ultrasound;Moist Heat;Traction;DME Instruction;Gait training;Stair training;Functional mobility training;Therapeutic activities;Therapeutic exercise;Orthotic Fit/Training;Neuromuscular re-education;Balance training;Patient/family education;Manual techniques;Wheelchair mobility training;Passive range of motion;Energy conservation;Taping;Visual/perceptual remediation/compensation  PT Next Visit Plan  standing  stability with reach to be able to pull up pants independently    PT Home Exercise Plan  TrA contraction, glute sets: needs updates. Potentially some time in supine flat in hospital bed, to improve hip extension deficits.     Consulted and Agree with Plan of Care  Patient       Patient will benefit from skilled therapeutic intervention in order to improve the following deficits and impairments:  Abnormal gait, Decreased activity tolerance, Decreased balance, Decreased knowledge of precautions, Decreased endurance, Decreased coordination, Decreased knowledge of use of DME, Decreased mobility, Decreased range of motion, Difficulty walking, Decreased safety awareness, Decreased strength, Impaired flexibility, Impaired perceived functional ability, Impaired tone, Postural dysfunction, Improper body mechanics, Pain  Visit Diagnosis: 1. Muscle weakness (generalized)   2. Other lack of coordination   3. Other abnormalities of gait and mobility   4. Hemiplegia and hemiparesis following cerebral infarction affecting right dominant side [facility])        Problem List Patient Active Problem List   Diagnosis Date Noted  . Acute CVA (cerebrovascular accident) ([place]) 05/02/2016  . Left-sided weakness 05/01/2016   Janna Arch, PT, DPT   09/28/2018, 3:10 PM  [place] MAIN [facility] SERVICES [address] [place], [place], [address] Phone: [phone]   Fax:  [phone]  Name: BOBY EYER MRN: [id number] Date of Birth: 12/03/1965

## 2018-09-30 ENCOUNTER — Encounter: Payer: Self-pay | Admitting: Occupational Therapy

## 2018-09-30 ENCOUNTER — Ambulatory Visit: Payer: Medicare HMO

## 2018-09-30 ENCOUNTER — Ambulatory Visit: Payer: Medicare HMO | Admitting: Occupational Therapy

## 2018-09-30 ENCOUNTER — Other Ambulatory Visit: Payer: Self-pay

## 2018-09-30 DIAGNOSIS — R278 Other lack of coordination: Secondary | ICD-10-CM

## 2018-09-30 DIAGNOSIS — R2689 Other abnormalities of gait and mobility: Secondary | ICD-10-CM

## 2018-09-30 DIAGNOSIS — M6281 Muscle weakness (generalized): Secondary | ICD-10-CM

## 2018-09-30 DIAGNOSIS — I69351 Hemiplegia and hemiparesis following cerebral infarction affecting right dominant side: Secondary | ICD-10-CM

## 2018-09-30 NOTE — Therapy (Signed)
[place] MAIN [facility] SERVICES [address] [place], [place], [address] Phone: [phone]   Fax:  [phone]  Physical Therapy Treatment  Patient Details  Name: Curtis Powell MRN: [id number] Date of Birth: 1965-09-21 No data recorded  Encounter Date: 09/30/2018  PT End of Session - 09/30/18 1412    Visit Number  [id number]    Number of Visits  137    Date for PT Re-Evaluation  10/20/18    Authorization Type  last goals: 08/25/18    Authorization Time Period  authorized 05/12/18-11/10/18    PT Start Time  1420    PT Stop Time  1501    PT Time Calculation (min)  41 min    Equipment Utilized During Treatment  Gait belt    Activity Tolerance  Patient tolerated treatment well;Patient limited by pain    Behavior During Therapy  WFL for tasks assessed/performed       Past Medical History:  Diagnosis Date  . GERD (gastroesophageal reflux disease)   . Hyperlipidemia   . Hypertension   . Obesity   . Stroke [facility])     Past Surgical History:  Procedure Laterality Date  . BRAIN SURGERY      There were no vitals filed for this visit.    Gait Training Ambulation with wheelchair follow and rolling walker. Frequent cues for upright posture and to increase hip flexion to improve toe clearance during swing. Ambulate 98 ft with tactile cues to PSIS and ASIS for anterior weight shift/step length. W/c follow with verbal cueing for head position to look ahead to decrease trunk flexion Improved velocity compared to previous sessions      Ther-ex  Sit to stand 10x with max cueing for sequencing/gluteal squeeze, Min A to reduce "plopping" of poor eccentric control   Standing with RW:   Hip flexion: 10x one leg, no trunk flexion, if trunk flexes count starts again at 0, requires seated rest break between LE's.   Stepping with tactile cueing to ASIS and PSIS for anterior weight shift and gluteal activation for step length, max verbal cueing for upright posture 6  x each LE with RW, much more challenging with RW than in // bars for patient.   Static stand with decreasing UE support, able to decrease to no UE support for ~8 seconds with Min A to retain COM x 5 minutes total   Seated: Seated LAQ kicking soccer ball for coordination/sequencing/positional awareness of body in space x 2 minutes GTB hamstring curl 10x each LE  GTB eccentric march (gluteal/hamstring) activation 10x each LE GTB adduction 10x each LE, PT resistance  Balloon taps inside and outside BOS reaches with reaction times 2 minutes.       Pt educated throughout session about proper posture and technique with exercises. Improved exercise technique, movement at target joints, use of target muscles after min to mod verbal, visual, tactile cues.                          PT Education - 09/30/18 1412    Education provided  Yes    Education Details  exercise technique, stability    Person(s) Educated  Patient    Methods  Explanation;Demonstration;Tactile cues;Verbal cues    Comprehension  Verbalized understanding;Returned demonstration;Verbal cues required;Tactile cues required       PT Short Term Goals - 08/25/18 1528      PT SHORT TERM GOAL #1   Title  Patient will be independent in home exercise program to improve strength/mobility for better functional independence with ADLs.    Baseline  hep compliant    Time  2    Period  Weeks    Status  Achieved      PT SHORT TERM GOAL #2   Title  Patient will require min cueing for STS transfer with CGA for increased independence with mobility.     Baseline  CGA    Time  2    Period  Weeks    Status  Achieved      PT SHORT TERM GOAL #3   Title  Patient will maintain upright posture for > 15 seconds to demonstrate strengthened postural control muscualture     Baseline  18 seconds     Time  2    Period  Weeks    Status  Achieved        PT Long Term Goals - 08/25/18 1537      PT LONG TERM GOAL #1   Title   Patient will perform bed mobility with Supervision to increase independent mobility    Baseline  able to perform mod I for increased time , occasional Min A for scooting up in bed; 7/16: min guard for safety with increased effort and time, cues to scoot back in bed before sit>supine 9/12: can do it independently but with difficulty    Time  8    Period  Weeks    Status  Achieved      PT LONG TERM GOAL #2   Title  Patient (< 32 years old) will complete five times sit to stand test in < 10 seconds indicating an increased LE strength and improved balance.    Baseline  2/18: 30 seconds from plinth chair height single UE on seat and single on walker ; 32 seconds from plinth with single UE on seat and single on walker. 4/10: 26 seconds from [facility] with one hand on walker5/29: 22.2 seconds with one hand on walker one hand on WC; 7/16: one hand on walker one hand on WC 18.96 seconds; pt achieving 75% of full upright standing 8/22:one hand on walker one hand on WC 18 seconds; pt achieving 75% of full upright standing  9/12: 18 seconds econds; pt achieving 75% of full upright standing 8/22:one hand on walker one hand on WC 10/15: 17seconds; pt achieving 75% of full upright posture 11/7: 16 seconds; pt achieving 75 upright posture 12/19: 15 seconds 80% upright posture 3/3: 18 seconds 80% upright posture. 5/19: 20.6s from [place], one hand on walker and one on WC, 80% upright posture    Time  8    Period  Weeks    Status  Partially Met    Target Date  10/20/18      PT LONG TERM GOAL #3   Title  Patient will ambulate 40 ft with walker with no breaks to allow for increased mobility within home.     Baseline  12/26: 29 ft with walker and WC follow with no rest breaks; 2/18: multiple trials previous session of >100 ft     Time  8    Period  Weeks    Status  Achieved      PT LONG TERM GOAL #4   Title  Patient will increase BLE gross strength to 4+/5 as to improve functional strength for independent gait, increased  standing tolerance and increased ADL ability.    Baseline   4-/5 RLE, 10/15:  4/5 RLE 11/7: 4/5 RLE 3/3: 4/5     Time  8    Period  Weeks    Status  Deferred    Target Date  10/20/18      PT LONG TERM GOAL #5   Title  Patient will increase lower extremity functional scale to >40/80 to demonstrate improved functional mobility and increased tolerance with ADLs.     Baseline  14/80; 12/26: 21/80; 2/18: 28/80  4/10; 25/80 5/29: 27/80; 7/16: 29/80 8/22: 30/80  9/12: 26/80 (feeling more tired due to moving) 10/15: 37/80 11/7: 23/80 3/3: 32/80; 08/25/18: 28/80    Time  8    Period  Weeks    Status  Partially Met    Target Date  10/20/18      Additional Long Term Goals   Additional Long Term Goals  Yes      PT LONG TERM GOAL #6   Title  Patient will walk through a door involving one small step up/down to increase functional independence.     Baseline  require CGA  BUE support in // bars at this time 10/15: required BUE and CGA in //bars; 08/25/18: Unsafe to attempt at this time    Time  8    Period  Weeks    Status  Deferred    Target Date  10/20/18      PT LONG TERM GOAL #7   Title  Patient will ambulate 40 ft with least assistive device and Supervision with no breaks to allow for increased mobility within home.     Baseline  requires use of walker and CGA     Time  8    Period  Weeks    Status  Achieved      PT LONG TERM GOAL #8   Title  Patient will perform 10 MWT in < 1 minute to demonstrate improved ambulatory velocity and capacity.     Baseline  4/10: 2 minutes 45 seconds; 5/29: 2 minute 31 seconds; 3 minutes 22 seconds 8/22: 3 minutes 2 seconds  9/12:  2 minutes and 43 seconds 10/15: 23mnute 12 seconds 11/7: 1 minute 21 seconds 12/19: 1 min 16 seconds 3/3: 57 seconds; 08/26/18: 1:41.4s= 0.10 m/s    Time  8    Period  Weeks    Status  Partially Met    Target Date  10/20/18      PT LONG TERM GOAL  #9   TITLE  Patient will increase Berg Balance score by > 6 points (26/56) to  demonstrate decreased fall risk during functional activities.    Baseline  9/12: 20/56 10/15: 21/56 11/7: 20/56 12/19: 22/56; 08/26/18: 11/56    Time  8    Period  Weeks    Status  Partially Met    Target Date  10/20/18      PT LONG TERM GOAL  #10   TITLE  Patient (< 727years old) will complete five times sit to stand test with full upright posture in < 30 seconds indicating an increased glute activation, LE strength and improved balance.    Baseline  10/15: 37seconds 11/7: 20 seconds     Time  8    Period  Weeks    Status  Achieved      PT LONG TERM GOAL  #11   TITLE  Patient will ambulate 100 ft with least assistive device and Supervision with no breaks to allow for increased mobility within home.     Baseline  10/17: 53f 11/7: 1020f   Time  8    Period  Weeks    Status  Achieved      PT LONG TERM GOAL  #12   TITLE  Patient (< [age]ears old) will complete five times sit to stand test with full upright posture in < 15 seconds indicating increased glute activation, LE strength and improved balance.    Baseline  11/7: 20 seconds 12/19: 18 seconds 3/3: 18 seconds; 08/25/18: 20.6s from [place] hand [place] walker and one on WC, 80% upright posture    Time  8    Period  Weeks    Status  Partially Met    Target Date  10/20/18      PT LONG TERM GOAL  #13   TITLE  Patient will ambulate 150 ft with least assistive device and Supervision with no breaks to allow for increased mobility within home.     Baseline  11/7: 10050f2/19: 115 ft  3/3: will assess next session, has done 200 ft with one rest break; 08/26/18: Pt fatigued after 30' today    Time  8    Period  Weeks    Status  Partially Met    Target Date  10/20/18      PT LONG TERM GOAL  #14   TITLE  Patient will decrease 10 meter walk test to <30 seconds as to improve gait speed for better community ambulation and to reduce fall risk.    Baseline  3/3: 57 seconds with RW and CGA; 08/26/18: 1:41    Time  8    Period  Weeks    Status   On-going    Target Date  10/20/18            Plan - 09/30/18 1510    Clinical Impression Statement  Patient demonstrated improved ambulatory velocity as well as stability in standing position. He was able to demonstrate intermittent static stand with  No UE support occasionally requiring Min A to retain COM due to posterior LOB. Patient continues to be challenged with prolonged standing interventions requiring seated rest break and interventions throughout session. Pt will benefit from PT services to address deficits in strength, balance, and mobility in order to return to full function at home.    Rehab Potential  Fair    Clinical Impairments Affecting Rehab Potential  hx of HTN, HLD, CVA, learning disability, Diabetes, brain tumor,     PT Frequency  2x / week    PT Duration  8 weeks    PT Treatment/Interventions  ADLs/Self Care Home Management;Aquatic Therapy;Ultrasound;Moist Heat;Traction;DME Instruction;Gait training;Stair training;Functional mobility training;Therapeutic activities;Therapeutic exercise;Orthotic Fit/Training;Neuromuscular re-education;Balance training;Patient/family education;Manual techniques;Wheelchair mobility training;Passive range of motion;Energy conservation;Taping;Visual/perceptual remediation/compensation    PT Next Visit Plan  standing stability with reach to be able to pull up pants independently    PT Home Exercise Plan  TrA contraction, glute sets: needs updates. Potentially some time in supine flat in hospital bed, to improve hip extension deficits.     Consulted and Agree with Plan of Care  Patient       Patient will benefit from skilled therapeutic intervention in order to improve the following deficits and impairments:  Abnormal gait, Decreased activity tolerance, Decreased balance, Decreased knowledge of precautions, Decreased endurance, Decreased coordination, Decreased knowledge of use of DME, Decreased mobility, Decreased range of motion, Difficulty  walking, Decreased safety awareness, Decreased strength, Impaired flexibility, Impaired perceived functional ability, Impaired tone, Postural dysfunction, Improper body mechanics, Pain  Visit Diagnosis: 1. Muscle weakness (generalized)   2. Other lack of coordination   3. Other abnormalities of gait and mobility   4. Hemiplegia and hemiparesis following cerebral infarction affecting right dominant side [facility])        Problem List Patient Active Problem List   Diagnosis Date Noted  . Acute CVA (cerebrovascular accident) ([place]) 05/02/2016  . Left-sided weakness 05/01/2016   Janna Arch, PT, DPT   09/30/2018, 3:11 PM  [place] MAIN [facility] SERVICES [address] [place], [place], [address] Phone: [phone]   Fax:  [phone]  Name: Curtis Powell MRN: [id number] Date of Birth: 1965-12-18

## 2018-09-30 NOTE — Therapy (Signed)
[place] MAIN [facility] SERVICES [address] [place], [place], [address] Phone: [phone]   Fax:  [phone]  Occupational Therapy Treatment  Patient Details  Name: Curtis Powell MRN: [id number] Date of Birth: 1965/09/07 No data recorded  Encounter Date: 09/30/2018  OT End of Session - 09/30/18 1335    Visit Number  104    Number of Visits  113    Date for OT Re-Evaluation  11/17/18    OT Start Time  1330    OT Stop Time  1415    OT Time Calculation (min)  45 min    Equipment Utilized During Treatment  Long handled shoe horn    Activity Tolerance  Patient tolerated treatment well    Behavior During Therapy  WFL for tasks assessed/performed       Past Medical History:  Diagnosis Date  . GERD (gastroesophageal reflux disease)   . Hyperlipidemia   . Hypertension   . Obesity   . Stroke [facility])     Past Surgical History:  Procedure Laterality Date  . BRAIN SURGERY      There were no vitals filed for this visit.  OT TREATMENT    Neuro muscular re-education:  Pt. worked on reaching with his RUE using the shape tower. Pt. grasped and moved the shapes through 2 vertical dowels of varying heights. Pt. Performed right [facility] skills training to improve speed and dexterity needed for ADL tasks and writing. Pt. demonstrated grasping 1 inch sticks,  inch cylindrical collars, and  inch flat washers on the Purdue pegboard. Pt. had difficulty storing the pegs in the palm of his hand, and performing translatory movements with his right hand. Pt. presented with less lateral leaning to the left during the task when placing the objects onto the Purdue Pegboard. Pt. worked on removing the pegs while alternating thumb opposition to the tip of his second through 5th digits.   Self-care:  Pt. performed toileting skills with CGA. Pt. was able to hike clothing easier.                        OT Education - 09/30/18 1333    Education  provided  Yes    Person(s) Educated  Patient    Methods  Explanation;Demonstration    Comprehension  Verbalized understanding;Returned demonstration;Verbal cues required       OT Short Term Goals - 03/03/17 1459      OT SHORT TERM GOAL #1   Title  `        OT Long Term Goals - 09/10/18 1544      OT LONG TERM GOAL #4   Title  Pt. will perform self dressing with minA and A/E as needed.    Baseline  Pt. continues to improve LB dressing in standing. Pt.  continues to require increased time and mod A donn socks, and pants over his feet. CGA standing to hike clothing. Pt. requires min A donning a jacket, and Independent doning a shirt.    Time  12    Period  Weeks    Status  On-going    Target Date  11/17/18      OT LONG TERM GOAL #6   Title  Pt. will write his name efficiently with 100% legibility    Baseline  Pt continues to present with limited legibility and efficiency when writing name to sign in when riding the [place]    Time  12  Period  Weeks    Status  On-going    Target Date  11/17/18      OT LONG TERM GOAL #7   Title  Pt will independently and consistently follow HEP to increase UE strength to increase functional independence    Baseline  Pt to continues to work towards independence with exercises    Time  12    Period  Weeks    Status  On-going    Target Date  11/17/18      OT LONG TERM GOAL #8   Title  Pt. will require supervision ironing a shirt.    Baseline  Pt. requires minA, and complete set-up seated using a tabletop iron    Time  12    Period  Weeks    Status  On-going    Target Date  11/17/18      OT LONG TERM GOAL  #9   Baseline  Pt. will independently be able to sew a button onto a shirt.    Time  12    Period  Weeks    Status  On-going    Target Date  11/17/18      OT LONG TERM GOAL  #10   TITLE  Pt. will independently be able to throw a ball    Baseline  Pt. is unable.    Time  12    Period  Weeks    Status  On-going    Target Date   11/17/18      OT LONG TERM GOAL  #11   TITLE  Pt. will improve UE functional reaching to be able to independently use his RUE to hand his clothes in the closest.    Baseline  Pt. continues to have difficulty hanging his clothes up in his closet.    Time  12    Period  Weeks    Status  On-going    Target Date  11/17/18            Plan - 09/30/18 1336    Clinical Impression Statement  Pt. reports that he is on a diet after going to the doctors. Pt. continues to present with limited BUE strength, and [facility] skills. Pt. presents with limited BUE strength, and [facility] skills. Pt. continues to work on improving RUE ROM, strength, and [place] skils in order to improve ADL, and IADL functioning, sewing, and ironing.    Occupational performance deficits (Please refer to evaluation for details):  ADL's;IADL's    Rehab Potential  Fair    Clinical Decision Making  Several treatment options, min-mod task modification necessary    OT Frequency  2x / week    OT Duration  12 weeks    OT Treatment/Interventions  Self-care/ADL training;Patient/family education;DME and/or AE instruction;Therapeutic exercise;Moist Heat;Neuromuscular education    Consulted and Agree with Plan of Care  Patient       Patient will benefit from skilled therapeutic intervention in order to improve the following deficits and impairments:           Visit Diagnosis: 1. Muscle weakness (generalized)   2. Other lack of coordination       Problem List Patient Active Problem List   Diagnosis Date Noted  . Acute CVA (cerebrovascular accident) ([place]) 05/02/2016  . Left-sided weakness 05/01/2016    Harrel Carina 09/30/2018, 2:32 PM  [place] MAIN [facility] SERVICES [address] [place], [place], [address] Phone: [phone]   Fax:  [phone]  Name:  VISENTE KIRKER MRN: [id number] Date of Birth: 11-11-1965

## 2018-10-05 ENCOUNTER — Ambulatory Visit: Payer: Medicare HMO | Admitting: Occupational Therapy

## 2018-10-05 ENCOUNTER — Ambulatory Visit: Payer: Medicare HMO

## 2018-10-07 ENCOUNTER — Encounter: Payer: Self-pay | Admitting: Occupational Therapy

## 2018-10-07 ENCOUNTER — Ambulatory Visit: Payer: Medicare HMO | Attending: Family Medicine | Admitting: Occupational Therapy

## 2018-10-07 ENCOUNTER — Ambulatory Visit: Payer: Medicare HMO

## 2018-10-07 ENCOUNTER — Other Ambulatory Visit: Payer: Self-pay

## 2018-10-07 DIAGNOSIS — I69351 Hemiplegia and hemiparesis following cerebral infarction affecting right dominant side: Secondary | ICD-10-CM | POA: Diagnosis present

## 2018-10-07 DIAGNOSIS — M6281 Muscle weakness (generalized): Secondary | ICD-10-CM | POA: Insufficient documentation

## 2018-10-07 DIAGNOSIS — R2689 Other abnormalities of gait and mobility: Secondary | ICD-10-CM | POA: Diagnosis present

## 2018-10-07 DIAGNOSIS — R278 Other lack of coordination: Secondary | ICD-10-CM | POA: Insufficient documentation

## 2018-10-07 NOTE — Therapy (Signed)
[place] MAIN [facility] SERVICES [address] [place], [place], [address] Phone: [phone]   Fax:  [phone]  Physical Therapy Treatment Physical Therapy Progress Note   Dates of reporting period  08/25/18   to   10/07/18   Patient Details  Name: TANK DIFIORE MRN: [id number] Date of Birth: 19-May-1965 No data recorded  Encounter Date: 10/07/2018  PT End of Session - 10/07/18 1324    Visit Number  120    Number of Visits  137    Date for PT Re-Evaluation  10/20/18    Authorization Type  PG 10/10 next session 1/10 with PN on 10/07/18    Authorization Time Period  authorized 05/12/18-11/10/18    PT Start Time  1325    PT Stop Time  1410    PT Time Calculation (min)  45 min    Equipment Utilized During Treatment  Gait belt    Activity Tolerance  Patient tolerated treatment well;Patient limited by pain    Behavior During Therapy  WFL for tasks assessed/performed       Past Medical History:  Diagnosis Date  . GERD (gastroesophageal reflux disease)   . Hyperlipidemia   . Hypertension   . Obesity   . Stroke [facility])     Past Surgical History:  Procedure Laterality Date  . BRAIN SURGERY      There were no vitals filed for this visit.  Subjective Assessment - 10/07/18 1419    Subjective  Patient presents to physical therapy with good motivation. Reports he has had some soreness in his shoulders but no pain. Has been compliant with HEP, unable to make last session due to transportation issues.    Pertinent History   Patient is a pleasant 53 year old male who presents to physical therapy for weakness and immobility secondary to CVA.  Had a stroke in Feb 2018. He was previously fully independent, but this stroke caused severe residual deficits, mainly on the right side as well as speech, and now he is unable to walk or perform most of his ADLs on his own. Entire right side is very weak. He still has a little difficulty with speech but his swallowing is  improved to baseline. His mom had to move in with him and now is his main caregiver.     Currently in Pain?  No/denies         [facility] PT Assessment - 10/07/18 0001      Berg Balance Test   Sit to Stand  Able to stand  independently using hands    Standing Unsupported  Able to stand 30 seconds unsupported    Sitting with Back Unsupported but Feet Supported on Floor or Stool  Able to sit safely and securely 2 minutes    Stand to Sit  Uses backs of legs against chair to control descent    Transfers  Able to transfer with verbal cueing and /or supervision    Standing Unsupported with Eyes Closed  Needs help to keep from falling    Standing Unsupported with Feet Together  Needs help to attain position but able to stand for 30 seconds with feet together    From Standing, Reach Forward with Outstretched Arm  Reaches forward but needs supervision    From Standing Position, Pick up Object from Floor  Unable to pick up and needs supervision    From Standing Position, Turn to Look Behind Over each Shoulder  Needs supervision when turning  Turn 360 Degrees  Needs assistance while turning    Standing Unsupported, Alternately Place Feet on Step/Stool  Needs assistance to keep from falling or unable to try    Standing Unsupported, One Foot in [employer] balance while stepping or standing    Standing on One Leg  Unable to try or needs assist to prevent fall    Total Score  17      Goals 5x STS: 22 seconds one hand on walker one hand on w/c  10 MWT: 74 seconds first trial 73 seconds second trial  BERG: 17/56   Treatment Static stand with decreasing UE support, able to decrease to no UE support with intermittent support to retain COM ~4 minutes  Standing with RW:              Hip flexion: 10x one leg, no trunk flexion, if trunk flexes count starts again at 0, requires seated rest break between LE's.  GTB hamstring curl 10x each LE  GTB eccentric march (gluteal/hamstring) activation 10x each  LE GTB adduction 10x each LE, PT resistance    Toileting performed with CGA and assistance to pulling up drawers after using restroom. Patient able to pull drawers down and perform STS with CGA only however was unable to reach to pull pants back up without assistance.   Pt educated throughout session about proper posture and technique with exercises. Improved exercise technique, movement at target joints, use of target muscles after min to mod verbal, visual, tactile cues.   Patient's condition has the potential to improve in response to therapy. Maximum improvement is yet to be obtained. The anticipated improvement is attainable and reasonable in a generally predictable time.  Patient reports that he is able to move around and use the bathroom with less help now.                  PT Education - 10/07/18 1419    Education provided  Yes    Education Details  goals, exercise technique    Person(s) Educated  Patient    Methods  Explanation;Demonstration;Tactile cues;Verbal cues    Comprehension  Verbalized understanding;Returned demonstration;Verbal cues required;Tactile cues required;Need further instruction       PT Short Term Goals - 10/07/18 1424      PT SHORT TERM GOAL #1   Title  Patient will be independent in home exercise program to improve strength/mobility for better functional independence with ADLs.    Baseline  hep compliant    Time  2    Period  Weeks    Status  Achieved      PT SHORT TERM GOAL #2   Title  Patient will require min cueing for STS transfer with CGA for increased independence with mobility.     Baseline  CGA    Time  2    Period  Weeks    Status  Achieved      PT SHORT TERM GOAL #3   Title  Patient will maintain upright posture for > 15 seconds to demonstrate strengthened postural control muscualture     Baseline  18 seconds     Time  2    Period  Weeks    Status  Achieved        PT Long Term Goals - 10/07/18 0001      PT LONG  TERM GOAL #2   Title  Patient (< 29 years old) will complete five times sit to stand test in <  10 seconds indicating an increased LE strength and improved balance.    Baseline  5/19: 20.6s from [facility], one hand on walker and one on WC, 80% upright posture 7/1: 22 seconds one hand on walker one hand on w/c     Time  8    Period  Weeks    Status  Partially Met    Target Date  10/20/18      PT LONG TERM GOAL #4   Title  Patient will increase BLE gross strength to 4+/5 as to improve functional strength for independent gait, increased standing tolerance and increased ADL ability.    Baseline   4-/5 RLE, 10/15: 4/5 RLE 11/7: 4/5 RLE 3/3: 4/5 7/1:  hip flexion 4/5 hip extension 2/5 hip abduction/adduciton 2/5     Time  8    Period  Weeks    Status  On-going    Target Date  10/20/18      PT LONG TERM GOAL #5   Title  Patient will increase lower extremity functional scale to >40/80 to demonstrate improved functional mobility and increased tolerance with ADLs.     Baseline  14/80; 12/26: 21/80; 2/18: 28/80  4/10; 25/80 5/29: 27/80; 7/16: 29/80 8/22: 30/80  9/12: 26/80 (feeling more tired due to moving) 10/15: 37/80 11/7: 23/80 3/3: 32/80; 08/25/18: 28/80    Time  8    Period  Weeks    Status  Partially Met    Target Date  10/20/18      PT LONG TERM GOAL #8   Title  Patient will perform 10 MWT in < 1 minute to demonstrate improved ambulatory velocity and capacity.     Baseline  4/10: 2 minutes 45 seconds; 5/29: 2 minute 31 seconds; 3 minutes 22 seconds 8/22: 3 minutes 2 seconds  9/12:  2 minutes and 43 seconds 10/15: 16mnute 12 seconds 11/7: 1 minute 21 seconds 12/19: 1 min 16 seconds 3/3: 57 seconds; 08/26/18: 1:41.4s= 0.10 m/s 7/1: 1 minute 14 seconds    Time  8    Period  Weeks    Status  Partially Met    Target Date  10/20/18      PT LONG TERM GOAL  #9   TITLE  Patient will increase Berg Balance score by > 6 points (26/56) to demonstrate decreased fall risk during functional activities.     Baseline  9/12: 20/56 10/15: 21/56 11/7: 20/56 12/19: 22/56; 08/26/18: 11/56 7/1: 17/56     Time  8    Period  Weeks    Status  Partially Met    Target Date  10/20/18      PT LONG TERM GOAL  #13   TITLE  Patient will ambulate 150 ft with least assistive device and Supervision with no breaks to allow for increased mobility within home.     Baseline  11/7: 1080f12/19: 115 ft  3/3: will assess next session, has done 200 ft with one rest break; 08/26/18: Pt fatigued after 30' today    Time  8    Period  Weeks    Status  Partially Met    Target Date  10/20/18            Plan - 10/07/18 1424    Clinical Impression Statement  Patient presents to physical therapy with improved gait speed as seen in his ability to perform the 10 MWT 30 seconds faster than when he was tested in May.  Patient is challenged with dynamic stability without  UE support requiring use of BUE when moving LE's. Patient's condition has the potential to improve in response to therapy. Maximum improvement is yet to be obtained. The anticipated improvement is attainable and reasonable in a generally predictable time. Pt will benefit from PT services to address deficits in strength, balance, and mobility in order to return to full function at home.    Rehab Potential  Fair    Clinical Impairments Affecting Rehab Potential  hx of HTN, HLD, CVA, learning disability, Diabetes, brain tumor,     PT Frequency  2x / week    PT Duration  8 weeks    PT Treatment/Interventions  ADLs/Self Care Home Management;Aquatic Therapy;Ultrasound;Moist Heat;Traction;DME Instruction;Gait training;Stair training;Functional mobility training;Therapeutic activities;Therapeutic exercise;Orthotic Fit/Training;Neuromuscular re-education;Balance training;Patient/family education;Manual techniques;Wheelchair mobility training;Passive range of motion;Energy conservation;Taping;Visual/perceptual remediation/compensation    PT Next Visit Plan  standing stability  with reach to be able to pull up pants independently    PT Home Exercise Plan  TrA contraction, glute sets: needs updates. Potentially some time in supine flat in hospital bed, to improve hip extension deficits.     Consulted and Agree with Plan of Care  Patient       Patient will benefit from skilled therapeutic intervention in order to improve the following deficits and impairments:  Abnormal gait, Decreased activity tolerance, Decreased balance, Decreased knowledge of precautions, Decreased endurance, Decreased coordination, Decreased knowledge of use of DME, Decreased mobility, Decreased range of motion, Difficulty walking, Decreased safety awareness, Decreased strength, Impaired flexibility, Impaired perceived functional ability, Impaired tone, Postural dysfunction, Improper body mechanics, Pain  Visit Diagnosis: 1. Muscle weakness (generalized)   2. Other lack of coordination   3. Other abnormalities of gait and mobility        Problem List Patient Active Problem List   Diagnosis Date Noted  . Acute CVA (cerebrovascular accident) ([place]) 05/02/2016  . Left-sided weakness 05/01/2016   Janna Arch, PT, DPT   10/07/2018, 2:31 PM  [place] MAIN [facility] SERVICES [address] [place], [place], [address] Phone: [phone]   Fax:  [phone]  Name: KINSEY COWSERT MRN: [id number] Date of Birth: 1966/03/25

## 2018-10-07 NOTE — Therapy (Signed)
[place] MAIN [facility] SERVICES [address] [place], [place], [address] Phone: [phone]   Fax:  [phone]  Occupational Therapy Treatment  Patient Details  Name: Curtis Powell MRN: [id number] Date of Birth: 05/12/1965 No data recorded  Encounter Date: 10/07/2018  OT End of Session - 10/07/18 1427    Visit Number  106    Number of Visits  113    Date for OT Re-Evaluation  11/17/18    OT Start Time  1420    OT Stop Time  1505    OT Time Calculation (min)  45 min    Activity Tolerance  Patient tolerated treatment well    Behavior During Therapy  [facility] for tasks assessed/performed       Past Medical History:  Diagnosis Date  . GERD (gastroesophageal reflux disease)   . Hyperlipidemia   . Hypertension   . Obesity   . Stroke [facility])     Past Surgical History:  Procedure Laterality Date  . BRAIN SURGERY      There were no vitals filed for this visit.  Subjective Assessment - 10/07/18 1418    Subjective   Pt. reports that his aunt is in the ICU.    Pertinent History  Pt. is a 53 y.o. male who suffered a CVA on 05/01/2016. Pt. was admitted to the hospital. Once discharged, he received Home Health PT and OT services for about a month. Pt. has had multiple CVAs over the past 8 years, and has had multiple falls in the past 6 months. Pt. resides in an apartment. Pt. has caregivers for 80 hours. Pt.'s mother stays with pt. at night, and assists with IADL tasks.      Currently in Pain?  No/denies      OT TREATMENT    Selfcare:  Pt. worked on right hand motor control skills using scissors to cut the paper to size creating a greeting card to fit an envelop. Pt. required assist for letter size, and spacing, sentence space, and spelling.  Pt. required cueing, and increased time for scissor use.                            OT Education - 10/07/18 1420    Education provided  Yes    Education Details  right hand [facility] skills     Person(s) Educated  Patient    Methods  Explanation;Demonstration    Comprehension  Verbalized understanding;Returned demonstration;Verbal cues required       OT Short Term Goals - 03/03/17 1459      OT SHORT TERM GOAL #1   Title  `        OT Long Term Goals - 09/10/18 1544      OT LONG TERM GOAL #4   Title  Pt. will perform self dressing with minA and A/E as needed.    Baseline  Pt. continues to improve LB dressing in standing. Pt.  continues to require increased time and mod A donn socks, and pants over his feet. CGA standing to hike clothing. Pt. requires min A donning a jacket, and Independent doning a shirt.    Time  12    Period  Weeks    Status  On-going    Target Date  11/17/18      OT LONG TERM GOAL #6   Title  Pt. will write his name efficiently with 100% legibility    Baseline  Pt continues to present with limited legibility and efficiency when writing name to sign in when riding the [place]    Time  12    Period  Weeks    Status  On-going    Target Date  11/17/18      OT LONG TERM GOAL #7   Title  Pt will independently and consistently follow HEP to increase UE strength to increase functional independence    Baseline  Pt to continues to work towards independence with exercises    Time  12    Period  Weeks    Status  On-going    Target Date  11/17/18      OT LONG TERM GOAL #8   Title  Pt. will require supervision ironing a shirt.    Baseline  Pt. requires minA, and complete set-up seated using a tabletop iron    Time  12    Period  Weeks    Status  On-going    Target Date  11/17/18      OT LONG TERM GOAL  #9   Baseline  Pt. will independently be able to sew a button onto a shirt.    Time  12    Period  Weeks    Status  On-going    Target Date  11/17/18      OT LONG TERM GOAL  #10   TITLE  Pt. will independently be able to throw a ball    Baseline  Pt. is unable.    Time  12    Period  Weeks    Status  On-going    Target Date  11/17/18      OT  LONG TERM GOAL  #11   TITLE  Pt. will improve UE functional reaching to be able to independently use his RUE to hand his clothes in the closest.    Baseline  Pt. continues to have difficulty hanging his clothes up in his closet.    Time  12    Period  Weeks    Status  On-going    Target Date  11/17/18            Plan - 10/07/18 1429    Clinical Impression Statement  Pt. reports that his right shoulder is feeling better overall. Pt. is making progress with daily ADL tasks incoroprating his RUE during more tasks. Pt. continuues to improve UE strenth, and [facility] skills in order to improve overall ADL, and IADL functioning.    Occupational performance deficits (Please refer to evaluation for details):  ADL's;IADL's    Body Structure / Function / Physical Skills  ADL;FMC;ROM;IADL;Pain;Coordination    Rehab Potential  Fair    Clinical Decision Making  Several treatment options, min-mod task modification necessary    OT Frequency  2x / week    OT Duration  12 weeks    OT Treatment/Interventions  Self-care/ADL training;Patient/family education;DME and/or AE instruction;Therapeutic exercise;Moist Heat;Neuromuscular education    Consulted and Agree with Plan of Care  Patient       Patient will benefit from skilled therapeutic intervention in order to improve the following deficits and impairments:   Body Structure / Function / Physical Skills: ADL, FMC, ROM, IADL, Pain, Coordination       Visit Diagnosis: 1. Muscle weakness (generalized)   2. Other lack of coordination       Problem List Patient Active Problem List   Diagnosis Date Noted  . Acute CVA (cerebrovascular accident) ([place]) 05/02/2016  .  Left-sided weakness 05/01/2016    Harrel Carina, MS, OTR/L 10/07/2018, 2:47 PM  [place] MAIN [facility] SERVICES [address] [place], [place], [address] Phone: [phone]   Fax:  [phone]  Name: Curtis Powell MRN: [id number] Date of  Birth: [birth year]

## 2018-10-12 ENCOUNTER — Ambulatory Visit: Payer: Medicare HMO

## 2018-10-12 ENCOUNTER — Other Ambulatory Visit: Payer: Self-pay

## 2018-10-12 ENCOUNTER — Encounter: Payer: Self-pay | Admitting: Occupational Therapy

## 2018-10-12 ENCOUNTER — Ambulatory Visit: Payer: Medicare HMO | Admitting: Occupational Therapy

## 2018-10-12 DIAGNOSIS — M6281 Muscle weakness (generalized): Secondary | ICD-10-CM | POA: Diagnosis not present

## 2018-10-12 DIAGNOSIS — R278 Other lack of coordination: Secondary | ICD-10-CM

## 2018-10-12 DIAGNOSIS — R2689 Other abnormalities of gait and mobility: Secondary | ICD-10-CM

## 2018-10-12 DIAGNOSIS — I69351 Hemiplegia and hemiparesis following cerebral infarction affecting right dominant side: Secondary | ICD-10-CM

## 2018-10-12 NOTE — Therapy (Signed)
[place] MAIN [facility] SERVICES [address] [place], [place], [address] Phone: [phone]   Fax:  [phone]  Occupational Therapy Treatment  Patient Details  Name: Curtis Powell MRN: [id number] Date of Birth: Feb 01, 1966 No data recorded  Encounter Date: 10/12/2018  OT End of Session - 10/12/18 1443    Visit Number  107    Number of Visits  113    Date for OT Re-Evaluation  11/17/18    Authorization Type  Progress reporting period starting 04/23/2018    OT Start Time  1430    OT Stop Time  1515    OT Time Calculation (min)  45 min    Equipment Utilized During Treatment  Long handled shoe horn    Activity Tolerance  Patient tolerated treatment well    Behavior During Therapy  WFL for tasks assessed/performed       Past Medical History:  Diagnosis Date  . GERD (gastroesophageal reflux disease)   . Hyperlipidemia   . Hypertension   . Obesity   . Stroke [facility])     Past Surgical History:  Procedure Laterality Date  . BRAIN SURGERY      There were no vitals filed for this visit.  Subjective Assessment - 10/12/18 1442    Subjective   Pt. reports that his aunt is in the ICU.    Pertinent History  Pt. is a 53 y.o. male who suffered a CVA on 05/01/2016. Pt. was admitted to the hospital. Once discharged, he received Home Health PT and OT services for about a month. Pt. has had multiple CVAs over the past 8 years, and has had multiple falls in the past 6 months. Pt. resides in an apartment. Pt. has caregivers for 80 hours. Pt.'s mother stays with pt. at night, and assists with IADL tasks.      Patient Stated Goals  To be able to throw a ball and dribble a ball, do as much as I can for myself.     Currently in Pain?  No/denies      OT TREATMENT    Neuro muscular re-education:  Pt. worked on UE motor control, and coordination skills while handling, and using tools. Pt. worked on Contractor, IT trainer, and screw drivers to  remove screws, and bolts of varying sizes. Pt. tolerated the session well, and was able to sustain his BUEs in elevation while manipulating the tools, and screws, nuts, and bolts. Pt. Did require increased time to complete the task, and required repositioning of the wrench.                         OT Education - 10/12/18 1443    Education provided  Yes    Education Details  right hand [facility] skills    Person(s) Educated  Patient    Methods  Explanation;Demonstration    Comprehension  Verbalized understanding;Returned demonstration;Verbal cues required       OT Short Term Goals - 03/03/17 1459      OT SHORT TERM GOAL #1   Title  `        OT Long Term Goals - 09/10/18 1544      OT LONG TERM GOAL #4   Title  Pt. will perform self dressing with minA and A/E as needed.    Baseline  Pt. continues to improve LB dressing in standing. Pt.  continues to require increased time and mod A donn socks, and  pants over his feet. CGA standing to hike clothing. Pt. requires min A donning a jacket, and Independent doning a shirt.    Time  12    Period  Weeks    Status  On-going    Target Date  11/17/18      OT LONG TERM GOAL #6   Title  Pt. will write his name efficiently with 100% legibility    Baseline  Pt continues to present with limited legibility and efficiency when writing name to sign in when riding the [place]    Time  12    Period  Weeks    Status  On-going    Target Date  11/17/18      OT LONG TERM GOAL #7   Title  Pt will independently and consistently follow HEP to increase UE strength to increase functional independence    Baseline  Pt to continues to work towards independence with exercises    Time  12    Period  Weeks    Status  On-going    Target Date  11/17/18      OT LONG TERM GOAL #8   Title  Pt. will require supervision ironing a shirt.    Baseline  Pt. requires minA, and complete set-up seated using a tabletop iron    Time  12    Period  Weeks     Status  On-going    Target Date  11/17/18      OT LONG TERM GOAL  #9   Baseline  Pt. will independently be able to sew a button onto a shirt.    Time  12    Period  Weeks    Status  On-going    Target Date  11/17/18      OT LONG TERM GOAL  #10   TITLE  Pt. will independently be able to throw a ball    Baseline  Pt. is unable.    Time  12    Period  Weeks    Status  On-going    Target Date  11/17/18      OT LONG TERM GOAL  #11   TITLE  Pt. will improve UE functional reaching to be able to independently use his RUE to hand his clothes in the closest.    Baseline  Pt. continues to have difficulty hanging his clothes up in his closet.    Time  12    Period  Weeks    Status  On-going    Target Date  11/17/18            Plan - 10/12/18 1443    Clinical Impression Statement  Pt. continues to make progress overall, and is now able to access his refridgerator to get a snack, or a beverage, and pt. is improving with toileting care tasks. Pt. continues to work on improving bilateral UE functioning,  hand coordination, motor control, and overall strength.    Occupational performance deficits (Please refer to evaluation for details):  ADL's;IADL's    Body Structure / Function / Physical Skills  ADL;FMC;ROM;IADL;Pain;Coordination    Rehab Potential  Fair    Clinical Decision Making  Several treatment options, min-mod task modification necessary    OT Frequency  2x / week    OT Duration  12 weeks    OT Treatment/Interventions  Self-care/ADL training;Patient/family education;DME and/or AE instruction;Therapeutic exercise;Moist Heat;Neuromuscular education    Consulted and Agree with Plan of Care  Patient  Patient will benefit from skilled therapeutic intervention in order to improve the following deficits and impairments:   Body Structure / Function / Physical Skills: ADL, FMC, ROM, IADL, Pain, Coordination       Visit Diagnosis: 1. Muscle weakness (generalized)   2. Other  lack of coordination       Problem List Patient Active Problem List   Diagnosis Date Noted  . Acute CVA (cerebrovascular accident) ([place]) 05/02/2016  . Left-sided weakness 05/01/2016    Harrel Carina, MS, OTR/L 10/12/2018, 2:58 PM  [place] MAIN [facility] SERVICES [address] [place], [place], [address] Phone: [phone]   Fax:  [phone]  Name: Curtis Powell MRN: [id number] Date of Birth: [birth year]

## 2018-10-12 NOTE — Therapy (Signed)
[place] MAIN [facility] SERVICES [address] [place], [place], [address] Phone: [phone]   Fax:  [phone]  Physical Therapy Treatment  Patient Details  Name: Curtis Powell MRN: [id number] Date of Birth: 1965/10/25 No data recorded  Encounter Date: 10/12/2018  PT End of Session - 10/12/18 1326    Visit Number  121    Number of Visits  137    Date for PT Re-Evaluation  10/20/18    Authorization Type  1/10 with PN on 10/07/18    Authorization Time Period  authorized 05/12/18-11/10/18    PT Start Time  1340    PT Stop Time  1427    PT Time Calculation (min)  47 min    Equipment Utilized During Treatment  Gait belt    Activity Tolerance  Patient tolerated treatment well;Patient limited by pain    Behavior During Therapy  WFL for tasks assessed/performed       Past Medical History:  Diagnosis Date  . GERD (gastroesophageal reflux disease)   . Hyperlipidemia   . Hypertension   . Obesity   . Stroke [facility])     Past Surgical History:  Procedure Laterality Date  . BRAIN SURGERY      There were no vitals filed for this visit.  Subjective Assessment - 10/12/18 1348    Subjective  Patient presents to physical therpay after having a a good holiday. Reports compliance with HEP and ambulation. No falls or LOB since last session.    Pertinent History   Patient is a pleasant 53 year old male who presents to physical therapy for weakness and immobility secondary to CVA.  Had a stroke in Feb 2018. He was previously fully independent, but this stroke caused severe residual deficits, mainly on the right side as well as speech, and now he is unable to walk or perform most of his ADLs on his own. Entire right side is very weak. He still has a little difficulty with speech but his swallowing is improved to baseline. His mom had to move in with him and now is his main caregiver.     Currently in Pain?  No/denies        Gait Training Ambulation with  wheelchair follow and rolling walker. Frequent cues for upright posture and to increase hip flexion to improve toe clearance during swing. Ambulate 112 ft with tactile cues to PSIS and ASIS for anterior weight shift/step length. One seated rest break.  W/c follow with verbal cueing for head position to look ahead to decrease trunk flexion Improved velocity compared to previous sessions      Ther-ex  Sit to stand 10x with max cueing for sequencing/gluteal squeeze, Min A to reduce "plopping" of poor eccentric control   Standing with RW:              Hip flexion: 10x one leg, no trunk flexion, if trunk flexes count starts again at 0, requires seated rest break between LE's.    Stepping with tactile cueing to ASIS and PSIS for anterior weight shift and gluteal activation for step length, max verbal cueing for upright posture 6 x each LE with RW, much more challenging with RW than in // bars for patient.    Static stand with decreasing UE support, able to decrease to no UE support for ~8 seconds with Min A to retain COM x 5 minutes total   Seated: Seated LAQ kicking balloon for coordination/sequencing/positional awareness of body in space  x 2 minutes GTB hamstring curl 10x each LE  GTB eccentric march (gluteal/hamstring) activation 10x each LE GTB adduction 10x each LE, PT resistance  Balloon taps inside and outside BOS reaches with reaction times 2 minutes. no trunk support.        Pt educated throughout session about proper posture and technique with exercises. Improved exercise technique, movement at target joints, use of target muscles after min to mod verbal, visual, tactile cues.   Patient presents to physical therapy with good motivation. Is increasing in functional capacity for mobility and stability in upright position. Patient requires less tactile support for upright position with decreased trunk extension. Pt will benefit from PT services to address deficits in strength, balance, and  mobility in order to return to full function at home.                       PT Education - 10/12/18 1326    Education provided  Yes    Education Details  exercise technique/stability    Person(s) Educated  Patient    Methods  Explanation;Demonstration;Tactile cues;Verbal cues    Comprehension  Verbalized understanding;Returned demonstration;Verbal cues required;Tactile cues required       PT Short Term Goals - 10/07/18 1424      PT SHORT TERM GOAL #1   Title  Patient will be independent in home exercise program to improve strength/mobility for better functional independence with ADLs.    Baseline  hep compliant    Time  2    Period  Weeks    Status  Achieved      PT SHORT TERM GOAL #2   Title  Patient will require min cueing for STS transfer with CGA for increased independence with mobility.     Baseline  CGA    Time  2    Period  Weeks    Status  Achieved      PT SHORT TERM GOAL #3   Title  Patient will maintain upright posture for > 15 seconds to demonstrate strengthened postural control muscualture     Baseline  18 seconds     Time  2    Period  Weeks    Status  Achieved        PT Long Term Goals - 10/07/18 0001      PT LONG TERM GOAL #2   Title  Patient (< 12 years old) will complete five times sit to stand test in < 10 seconds indicating an increased LE strength and improved balance.    Baseline  5/19: 20.6s from [facility] one hand on walker and one on WC, 80% upright posture 7/1: 22 seconds one hand on walker one hand on w/c     Time  8    Period  Weeks    Status  Partially Met    Target Date  10/20/18      PT LONG TERM GOAL #4   Title  Patient will increase BLE gross strength to 4+/5 as to improve functional strength for independent gait, increased standing tolerance and increased ADL ability.    Baseline   4-/5 RLE, 10/15: 4/5 RLE 11/7: 4/5 RLE 3/3: 4/5 7/1:  hip flexion 4/5 hip extension 2/5 hip abduction/adduciton 2/5     Time  8    Period   Weeks    Status  On-going    Target Date  10/20/18      PT LONG TERM GOAL #5   Title  Patient will increase lower extremity functional scale to >40/80 to demonstrate improved functional mobility and increased tolerance with ADLs.     Baseline  14/80; 12/26: 21/80; 2/18: 28/80  4/10; 25/80 5/29: 27/80; 7/16: 29/80 8/22: 30/80  9/12: 26/80 (feeling more tired due to moving) 10/15: 37/80 11/7: 23/80 3/3: 32/80; 08/25/18: 28/80    Time  8    Period  Weeks    Status  Partially Met    Target Date  10/20/18      PT LONG TERM GOAL #8   Title  Patient will perform 10 MWT in < 1 minute to demonstrate improved ambulatory velocity and capacity.     Baseline  4/10: 2 minutes 45 seconds; 5/29: 2 minute 31 seconds; 3 minutes 22 seconds 8/22: 3 minutes 2 seconds  9/12:  2 minutes and 43 seconds 10/15: 105mnute 12 seconds 11/7: 1 minute 21 seconds 12/19: 1 min 16 seconds 3/3: 57 seconds; 08/26/18: 1:41.4s= 0.10 m/s 7/1: 1 minute 14 seconds    Time  8    Period  Weeks    Status  Partially Met    Target Date  10/20/18      PT LONG TERM GOAL  #9   TITLE  Patient will increase Berg Balance score by > 6 points (26/56) to demonstrate decreased fall risk during functional activities.    Baseline  9/12: 20/56 10/15: 21/56 11/7: 20/56 12/19: 22/56; 08/26/18: 11/56 7/1: 17/56     Time  8    Period  Weeks    Status  Partially Met    Target Date  10/20/18      PT LONG TERM GOAL  #13   TITLE  Patient will ambulate 150 ft with least assistive device and Supervision with no breaks to allow for increased mobility within home.     Baseline  11/7: 1071f12/19: 115 ft  3/3: will assess next session, has done 200 ft with one rest break; 08/26/18: Pt fatigued after 30' today    Time  8    Period  Weeks    Status  Partially Met    Target Date  10/20/18            Plan - 10/12/18 1755    Clinical Impression Statement  Patient presents to physical therapy with good motivation. Is increasing in functional capacity for  mobility and stability in upright position. Patient requires less tactile support for upright position with decreased trunk extension. Pt will benefit from PT services to address deficits in strength, balance, and mobility in order to return to full function at home.    Rehab Potential  Fair    Clinical Impairments Affecting Rehab Potential  hx of HTN, HLD, CVA, learning disability, Diabetes, brain tumor,     PT Frequency  2x / week    PT Duration  8 weeks    PT Treatment/Interventions  ADLs/Self Care Home Management;Aquatic Therapy;Ultrasound;Moist Heat;Traction;DME Instruction;Gait training;Stair training;Functional mobility training;Therapeutic activities;Therapeutic exercise;Orthotic Fit/Training;Neuromuscular re-education;Balance training;Patient/family education;Manual techniques;Wheelchair mobility training;Passive range of motion;Energy conservation;Taping;Visual/perceptual remediation/compensation    PT Next Visit Plan  standing stability with reach to be able to pull up pants independently    PT Home Exercise Plan  TrA contraction, glute sets: needs updates. Potentially some time in supine flat in hospital bed, to improve hip extension deficits.     Consulted and Agree with Plan of Care  Patient       Patient will benefit from skilled therapeutic intervention in order to improve the  following deficits and impairments:  Abnormal gait, Decreased activity tolerance, Decreased balance, Decreased knowledge of precautions, Decreased endurance, Decreased coordination, Decreased knowledge of use of DME, Decreased mobility, Decreased range of motion, Difficulty walking, Decreased safety awareness, Decreased strength, Impaired flexibility, Impaired perceived functional ability, Impaired tone, Postural dysfunction, Improper body mechanics, Pain  Visit Diagnosis: 1. Muscle weakness (generalized)   2. Other lack of coordination   3. Other abnormalities of gait and mobility   4. Hemiplegia and  hemiparesis following cerebral infarction affecting right dominant side [facility])        Problem List Patient Active Problem List   Diagnosis Date Noted  . Acute CVA (cerebrovascular accident) ([place]) 05/02/2016  . Left-sided weakness 05/01/2016   Janna Arch, PT, DPT   10/12/2018, 5:56 PM  [place] MAIN [facility] SERVICES [address] [place], [place], [address] Phone: [phone]   Fax:  [phone]  Name: Curtis Powell MRN: [id number] Date of Birth: [birth year]

## 2018-10-14 ENCOUNTER — Ambulatory Visit: Payer: Medicare HMO | Admitting: Occupational Therapy

## 2018-10-14 ENCOUNTER — Ambulatory Visit: Payer: Medicare HMO

## 2018-10-14 ENCOUNTER — Encounter: Payer: Self-pay | Admitting: Occupational Therapy

## 2018-10-14 ENCOUNTER — Other Ambulatory Visit: Payer: Self-pay

## 2018-10-14 DIAGNOSIS — R2689 Other abnormalities of gait and mobility: Secondary | ICD-10-CM

## 2018-10-14 DIAGNOSIS — M6281 Muscle weakness (generalized): Secondary | ICD-10-CM

## 2018-10-14 DIAGNOSIS — R278 Other lack of coordination: Secondary | ICD-10-CM

## 2018-10-14 NOTE — Therapy (Signed)
[place] MAIN [facility] SERVICES [address] [place], [place], [address] Phone: [phone]   Fax:  [phone]  Physical Therapy Treatment  Patient Details  Name: Curtis Powell MRN: [id number] Date of Birth: 1965/10/10 No data recorded  Encounter Date: 10/14/2018  PT End of Session - 10/14/18 1352    Visit Number  122    Number of Visits  137    Date for PT Re-Evaluation  10/20/18    Authorization Type  2/10 with PN on 10/07/18    Authorization Time Period  authorized 05/12/18-11/10/18    PT Start Time  1607    PT Stop Time  1430    PT Time Calculation (min)  45 min    Equipment Utilized During Treatment  Gait belt    Activity Tolerance  Patient tolerated treatment well;Patient limited by pain    Behavior During Therapy  WFL for tasks assessed/performed       Past Medical History:  Diagnosis Date  . GERD (gastroesophageal reflux disease)   . Hyperlipidemia   . Hypertension   . Obesity   . Stroke [facility])     Past Surgical History:  Procedure Laterality Date  . BRAIN SURGERY      There were no vitals filed for this visit.  Subjective Assessment - 10/14/18 1351    Subjective  Patient reports compliance with HEP at home, has been active  daily with aide, not able to walk as far due to having shorter hallways. No falls or LOB since last session.    Pertinent History   Patient is a pleasant 53 year old male who presents to physical therapy for weakness and immobility secondary to CVA.  Had a stroke in Feb 2018. He was previously fully independent, but this stroke caused severe residual deficits, mainly on the right side as well as speech, and now he is unable to walk or perform most of his ADLs on his own. Entire right side is very weak. He still has a little difficulty with speech but his swallowing is improved to baseline. His mom had to move in with him and now is his main caregiver.     Currently in Pain?  No/denies          Gait  Training Ambulation with wheelchair follow and rolling walker. Frequent cues for upright posture and to increase hip flexion to improve toe clearance during swing. Ambulate 80 ft with tactile cues to PSIS and ASIS for anterior weight shift/step length.  W/c follow with verbal cueing for head position to look ahead to decrease trunk flexion Improved velocity compared to previous sessions      Ther-ex  Sit to stand 10x with max cueing for sequencing/gluteal squeeze, Min A to reduce "plopping" of poor eccentric control   Ambulate in // bars with speed ladder: one foot in each square for increased step length and foot clearance. Tactile cueing to PSIS and ASIS   Backwards ambulation in // bars with CGA and BUE support x4 lengths of // bars ; buckling of R knee with fatigue.    Static stand with decreasing UE support, able to decrease to no UE support for ~8 seconds with Min A to retain COM x 5 minutes total   Seated: Seated LAQ kicking soccer ball for coordination/sequencing/positional awareness of body in space x10 each LE Seated LAQ kicking soccer ball between cones for coordination/sequencing for muscle activation and coordination.   Seated alphabet each LE  Bathroom mobility.  Increasing independence with no assistance required for dressing, supervision only.    Pt educated throughout session about proper posture and technique with exercises. Improved exercise technique, movement at target joints, use of target muscles after min to mod verbal, visual, tactile cues.                           PT Education - 10/14/18 1352    Education provided  Yes    Education Details  exercise technique/stability    Person(s) Educated  Patient    Methods  Explanation;Demonstration;Tactile cues;Verbal cues    Comprehension  Verbalized understanding;Returned demonstration;Verbal cues required;Tactile cues required       PT Short Term Goals - 10/07/18 1424      PT SHORT TERM GOAL  #1   Title  Patient will be independent in home exercise program to improve strength/mobility for better functional independence with ADLs.    Baseline  hep compliant    Time  2    Period  Weeks    Status  Achieved      PT SHORT TERM GOAL #2   Title  Patient will require min cueing for STS transfer with CGA for increased independence with mobility.     Baseline  CGA    Time  2    Period  Weeks    Status  Achieved      PT SHORT TERM GOAL #3   Title  Patient will maintain upright posture for > 15 seconds to demonstrate strengthened postural control muscualture     Baseline  18 seconds     Time  2    Period  Weeks    Status  Achieved        PT Long Term Goals - 10/07/18 0001      PT LONG TERM GOAL #2   Title  Patient (< 91 years old) will complete five times sit to stand test in < 10 seconds indicating an increased LE strength and improved balance.    Baseline  5/19: 20.6s from [facility], one hand on walker and one on WC, 80% upright posture 7/1: 22 seconds one hand on walker one hand on w/c     Time  8    Period  Weeks    Status  Partially Met    Target Date  10/20/18      PT LONG TERM GOAL #4   Title  Patient will increase BLE gross strength to 4+/5 as to improve functional strength for independent gait, increased standing tolerance and increased ADL ability.    Baseline   4-/5 RLE, 10/15: 4/5 RLE 11/7: 4/5 RLE 3/3: 4/5 7/1:  hip flexion 4/5 hip extension 2/5 hip abduction/adduciton 2/5     Time  8    Period  Weeks    Status  On-going    Target Date  10/20/18      PT LONG TERM GOAL #5   Title  Patient will increase lower extremity functional scale to >40/80 to demonstrate improved functional mobility and increased tolerance with ADLs.     Baseline  14/80; 12/26: 21/80; 2/18: 28/80  4/10; 25/80 5/29: 27/80; 7/16: 29/80 8/22: 30/80  9/12: 26/80 (feeling more tired due to moving) 10/15: 37/80 11/7: 23/80 3/3: 32/80; 08/25/18: 28/80    Time  8    Period  Weeks    Status  Partially  Met    Target Date  10/20/18      PT  LONG TERM GOAL #8   Title  Patient will perform 10 MWT in < 1 minute to demonstrate improved ambulatory velocity and capacity.     Baseline  4/10: 2 minutes 45 seconds; 5/29: 2 minute 31 seconds; 3 minutes 22 seconds 8/22: 3 minutes 2 seconds  9/12:  2 minutes and 43 seconds 10/15: 16mnute 12 seconds 11/7: 1 minute 21 seconds 12/19: 1 min 16 seconds 3/3: 57 seconds; 08/26/18: 1:41.4s= 0.10 m/s 7/1: 1 minute 14 seconds    Time  8    Period  Weeks    Status  Partially Met    Target Date  10/20/18      PT LONG TERM GOAL  #9   TITLE  Patient will increase Berg Balance score by > 6 points (26/56) to demonstrate decreased fall risk during functional activities.    Baseline  9/12: 20/56 10/15: 21/56 11/7: 20/56 12/19: 22/56; 08/26/18: 11/56 7/1: 17/56     Time  8    Period  Weeks    Status  Partially Met    Target Date  10/20/18      PT LONG TERM GOAL  #13   TITLE  Patient will ambulate 150 ft with least assistive device and Supervision with no breaks to allow for increased mobility within home.     Baseline  11/7: 1058f12/19: 115 ft  3/3: will assess next session, has done 200 ft with one rest break; 08/26/18: Pt fatigued after 30' today    Time  8    Period  Weeks    Status  Partially Met    Target Date  10/20/18            Plan - 10/14/18 1425    Clinical Impression Statement  Patient has occasional buckling of R knee with fatigue.  Is increasing in gait speed as seen in ability to ambulate in hallway with decreasing need for seated and standing rest breaks.Utilized music with tactile and verbal cueing for gait mechanics with an improvement in velocity/muscle contractions with increased tempo of music. Pt will benefit from PT services to address deficits in strength, balance, and mobility in order to return to full function at home.    Rehab Potential  Fair    Clinical Impairments Affecting Rehab Potential  hx of HTN, HLD, CVA, learning disability,  Diabetes, brain tumor,     PT Frequency  2x / week    PT Duration  8 weeks    PT Treatment/Interventions  ADLs/Self Care Home Management;Aquatic Therapy;Ultrasound;Moist Heat;Traction;DME Instruction;Gait training;Stair training;Functional mobility training;Therapeutic activities;Therapeutic exercise;Orthotic Fit/Training;Neuromuscular re-education;Balance training;Patient/family education;Manual techniques;Wheelchair mobility training;Passive range of motion;Energy conservation;Taping;Visual/perceptual remediation/compensation    PT Next Visit Plan  standing stability with reach to be able to pull up pants independently    PT Home Exercise Plan  TrA contraction, glute sets: needs updates. Potentially some time in supine flat in hospital bed, to improve hip extension deficits.     Consulted and Agree with Plan of Care  Patient       Patient will benefit from skilled therapeutic intervention in order to improve the following deficits and impairments:  Abnormal gait, Decreased activity tolerance, Decreased balance, Decreased knowledge of precautions, Decreased endurance, Decreased coordination, Decreased knowledge of use of DME, Decreased mobility, Decreased range of motion, Difficulty walking, Decreased safety awareness, Decreased strength, Impaired flexibility, Impaired perceived functional ability, Impaired tone, Postural dysfunction, Improper body mechanics, Pain  Visit Diagnosis: 1. Muscle weakness (generalized)   2. Other lack of coordination  3. Other abnormalities of gait and mobility        Problem List Patient Active Problem List   Diagnosis Date Noted  . Acute CVA (cerebrovascular accident) ([place]) 05/02/2016  . Left-sided weakness 05/01/2016   Janna Arch, PT, DPT   10/14/2018, 3:22 PM  [place] MAIN [facility] SERVICES [address] [place], [place], [address] Phone: [phone]   Fax:  [phone]  Name: Curtis Powell MRN:  [id number] Date of Birth: 02/22/1966

## 2018-10-14 NOTE — Therapy (Signed)
[place] MAIN [facility] SERVICES [address] [place], [place], [address] Phone: [phone]   Fax:  [phone]  Occupational Therapy Treatment  Patient Details  Name: Curtis Powell MRN: [id number] Date of Birth: Feb 27, 1966 No data recorded  Encounter Date: 10/14/2018  OT End of Session - 10/14/18 1447    Visit Number  108    Number of Visits  113    Date for OT Re-Evaluation  11/17/18    Authorization Type  Progress reporting period starting 04/23/2018    OT Start Time  1430    OT Stop Time  1515    OT Time Calculation (min)  45 min    Equipment Utilized During Treatment  Long handled shoe horn    Activity Tolerance  Patient tolerated treatment well    Behavior During Therapy  WFL for tasks assessed/performed       Past Medical History:  Diagnosis Date  . GERD (gastroesophageal reflux disease)   . Hyperlipidemia   . Hypertension   . Obesity   . Stroke [facility])     Past Surgical History:  Procedure Laterality Date  . BRAIN SURGERY      There were no vitals filed for this visit.  Subjective Assessment - 10/14/18 1443    Subjective   Pt. reports that she is doing better, and is out of the ICU.    Pertinent History  Pt. is a 53 y.o. male who suffered a CVA on 05/01/2016. Pt. was admitted to the hospital. Once discharged, he received Home Health PT and OT services for about a month. Pt. has had multiple CVAs over the past 8 years, and has had multiple falls in the past 6 months. Pt. resides in an apartment. Pt. has caregivers for 80 hours. Pt.'s mother stays with pt. at night, and assists with IADL tasks.      Currently in Pain?  No/denies      OT TREATMENT    Neuro muscular re-education:  Pt. worked on Financial risk analyst a Armed forces operational officer patterns of varying complexity. Pt. did not complete the one of moderate complexity. Pt. worked on connecting the pieces distally with his bilateral UEs sustained in elevation. Pt. required  cues, increased time to complete, and restbreaks when connecting the piece in elevation.                        OT Education - 10/14/18 1446    Education provided  Yes    Education Details  right hand [facility] skills    Person(s) Educated  Patient    Methods  Explanation;Demonstration    Comprehension  Verbalized understanding;Returned demonstration;Verbal cues required    Person(s) Educated  Patient    Methods  Explanation;Demonstration;Tactile cues    Comprehension  Verbalized understanding;Returned demonstration;Verbal cues required;Tactile cues required       OT Short Term Goals - 03/03/17 1459      OT SHORT TERM GOAL #1   Title  `        OT Long Term Goals - 09/10/18 1544      OT LONG TERM GOAL #4   Title  Pt. will perform self dressing with minA and A/E as needed.    Baseline  Pt. continues to improve LB dressing in standing. Pt.  continues to require increased time and mod A donn socks, and pants over his feet. CGA standing to hike clothing. Pt. requires min A donning a jacket, and Independent  doning a shirt.    Time  12    Period  Weeks    Status  On-going    Target Date  11/17/18      OT LONG TERM GOAL #6   Title  Pt. will write his name efficiently with 100% legibility    Baseline  Pt continues to present with limited legibility and efficiency when writing name to sign in when riding the [place]    Time  12    Period  Weeks    Status  On-going    Target Date  11/17/18      OT LONG TERM GOAL #7   Title  Pt will independently and consistently follow HEP to increase UE strength to increase functional independence    Baseline  Pt to continues to work towards independence with exercises    Time  12    Period  Weeks    Status  On-going    Target Date  11/17/18      OT LONG TERM GOAL #8   Title  Pt. will require supervision ironing a shirt.    Baseline  Pt. requires minA, and complete set-up seated using a tabletop iron    Time  12    Period  Weeks     Status  On-going    Target Date  11/17/18      OT LONG TERM GOAL  #9   Baseline  Pt. will independently be able to sew a button onto a shirt.    Time  12    Period  Weeks    Status  On-going    Target Date  11/17/18      OT LONG TERM GOAL  #10   TITLE  Pt. will independently be able to throw a ball    Baseline  Pt. is unable.    Time  12    Period  Weeks    Status  On-going    Target Date  11/17/18      OT LONG TERM GOAL  #11   TITLE  Pt. will improve UE functional reaching to be able to independently use his RUE to hand his clothes in the closest.    Baseline  Pt. continues to have difficulty hanging his clothes up in his closet.    Time  12    Period  Weeks    Status  On-going    Target Date  11/17/18            Plan - 10/14/18 1447    Clinical Impression Statement  Pt. continues to make steady progress with BUE strength, bilateral hand motor control, and [place] skills. Pt. is engaging more in ADL tasks at home. Pt. reports that he does not need as much help with morning ADL/self-care from his caregivers. Pt. continues to work on improving UE strength, and [facility] skills in order to improve ADL, and IADL functioning.    Occupational performance deficits (Please refer to evaluation for details):  ADL's;IADL's    Body Structure / Function / Physical Skills  ADL;FMC;ROM;IADL;Pain;Coordination    Rehab Potential  Fair    Clinical Decision Making  Several treatment options, min-mod task modification necessary    OT Frequency  2x / week    OT Duration  12 weeks    OT Treatment/Interventions  Self-care/ADL training;Patient/family education;DME and/or AE instruction;Therapeutic exercise;Moist Heat;Neuromuscular education    Consulted and Agree with Plan of Care  Patient       Patient will benefit  from skilled therapeutic intervention in order to improve the following deficits and impairments:   Body Structure / Function / Physical Skills: ADL, FMC, ROM, IADL, Pain, Coordination        Visit Diagnosis: 1. Muscle weakness (generalized)   2. Other lack of coordination       Problem List Patient Active Problem List   Diagnosis Date Noted  . Acute CVA (cerebrovascular accident) ([place]) 05/02/2016  . Left-sided weakness 05/01/2016    Harrel Carina, MS,OTR/L 10/14/2018, 3:01 PM  [place] MAIN [name] SERVICES [address] [place], [place], [address] Phone: [phone]   Fax:  [phone]  Name: Curtis Powell MRN: [id number] Date of Birth: Aug 25, 1965

## 2018-10-19 ENCOUNTER — Ambulatory Visit: Payer: Medicare HMO | Admitting: Occupational Therapy

## 2018-10-19 ENCOUNTER — Ambulatory Visit: Payer: Medicare HMO

## 2018-10-21 ENCOUNTER — Ambulatory Visit: Payer: Medicare HMO

## 2018-10-21 ENCOUNTER — Ambulatory Visit: Payer: Medicare HMO | Admitting: Occupational Therapy

## 2018-10-26 ENCOUNTER — Encounter: Payer: Self-pay | Admitting: Occupational Therapy

## 2018-10-26 ENCOUNTER — Other Ambulatory Visit: Payer: Self-pay

## 2018-10-26 ENCOUNTER — Ambulatory Visit: Payer: Medicare HMO | Admitting: Occupational Therapy

## 2018-10-26 ENCOUNTER — Ambulatory Visit: Payer: Medicare HMO

## 2018-10-26 DIAGNOSIS — M6281 Muscle weakness (generalized): Secondary | ICD-10-CM | POA: Diagnosis not present

## 2018-10-26 DIAGNOSIS — R2689 Other abnormalities of gait and mobility: Secondary | ICD-10-CM

## 2018-10-26 DIAGNOSIS — R278 Other lack of coordination: Secondary | ICD-10-CM

## 2018-10-26 NOTE — Therapy (Signed)
[place] MAIN [facility] SERVICES [address] [place], [place], [address] Phone: [phone]   Fax:  [phone]  Occupational Therapy Treatment  Patient Details  Name: Curtis Powell MRN: [id number] Date of Birth: Aug 09, 1965 No data recorded  Encounter Date: 10/26/2018  OT End of Session - 10/26/18 1454    Visit Number  109    Number of Visits  113    Date for OT Re-Evaluation  11/17/18    Authorization Type  Progress reporting period starting 04/23/2018    OT Start Time  1430    OT Stop Time  1515    OT Time Calculation (min)  45 min    Activity Tolerance  Patient tolerated treatment well    Behavior During Therapy  [facility] for tasks assessed/performed       Past Medical History:  Diagnosis Date  . GERD (gastroesophageal reflux disease)   . Hyperlipidemia   . Hypertension   . Obesity   . Stroke [facility])     Past Surgical History:  Procedure Laterality Date  . BRAIN SURGERY      There were no vitals filed for this visit.  Subjective Assessment - 10/26/18 1451    Subjective   Pt reported that his Aunt passed away last 07/06/22 and funeral was Wed.    Pertinent History  Pt. is a 53 y.o. male who suffered a CVA on 05/01/2016. Pt. was admitted to the hospital. Once discharged, he received Home Health PT and OT services for about a month. Pt. has had multiple CVAs over the past 8 years, and has had multiple falls in the past 6 months. Pt. resides in an apartment. Pt. has caregivers for 80 hours. Pt.'s mother stays with pt. at night, and assists with IADL tasks.      Patient Stated Goals  To be able to throw a ball and dribble a ball, do as much as I can for myself.     Currently in Pain?  No/denies       Neuro muscular re-education:  Pt. worked on Financial risk analyst a Armed forces operational officer patterns of varying complexity. Pt.had difficulty with placement of angles and needed mod cues to make adjustments and to identify what was wrong. Good  attention and problem solving skills demonstrated during task. He required extra time to complete due to changes needed.  Improved reaching and use of L hand with R for task and no pain noted.  Pt. worked on connecting the pieces distally with his bilateral UEs sustained in elevation. Pt. required cues, increased time to complete, and rest breaks when connecting the piece in elevation                     OT Education - 10/26/18 1453    Education provided  Yes    Education Details  right hand [facility] skills    Person(s) Educated  Patient    Methods  Explanation;Demonstration    Comprehension  Verbalized understanding;Returned demonstration;Verbal cues required       OT Short Term Goals - 03/03/17 1459      OT SHORT TERM GOAL #1   Title  `        OT Long Term Goals - 09/10/18 1544      OT LONG TERM GOAL #4   Title  Pt. will perform self dressing with minA and A/E as needed.    Baseline  Pt. continues to improve LB dressing in standing. Pt.  continues to require increased time and mod A donn socks, and pants over his feet. CGA standing to hike clothing. Pt. requires min A donning a jacket, and Independent doning a shirt.    Time  12    Period  Weeks    Status  On-going    Target Date  11/17/18      OT LONG TERM GOAL #6   Title  Pt. will write his name efficiently with 100% legibility    Baseline  Pt continues to present with limited legibility and efficiency when writing name to sign in when riding the [place]    Time  12    Period  Weeks    Status  On-going    Target Date  11/17/18      OT LONG TERM GOAL #7   Title  Pt will independently and consistently follow HEP to increase UE strength to increase functional independence    Baseline  Pt to continues to work towards independence with exercises    Time  12    Period  Weeks    Status  On-going    Target Date  11/17/18      OT LONG TERM GOAL #8   Title  Pt. will require supervision ironing a shirt.    Baseline   Pt. requires minA, and complete set-up seated using a tabletop iron    Time  12    Period  Weeks    Status  On-going    Target Date  11/17/18      OT LONG TERM GOAL  #9   Baseline  Pt. will independently be able to sew a button onto a shirt.    Time  12    Period  Weeks    Status  On-going    Target Date  11/17/18      OT LONG TERM GOAL  #10   TITLE  Pt. will independently be able to throw a ball    Baseline  Pt. is unable.    Time  12    Period  Weeks    Status  On-going    Target Date  11/17/18      OT LONG TERM GOAL  #11   TITLE  Pt. will improve UE functional reaching to be able to independently use his RUE to hand his clothes in the closest.    Baseline  Pt. continues to have difficulty hanging his clothes up in his closet.    Time  12    Period  Weeks    Status  On-going    Target Date  11/17/18            Plan - 10/26/18 1455    Clinical Impression Statement  Pt. continues to make steady progress with BUE strength, bilateral hand motor control, and [facility] skills. Pt. is engaging more in ADL tasks at home. Pt. reports that he does not need as much help with morning ADL/self-care from his caregivers. Pt. continues to work on improving UE strength, and [facility] skills in order to improve ADL, and IADL functioning.    OT Occupational Profile and History  Problem Focused Assessment - Including review of records relating to presenting problem    Occupational performance deficits (Please refer to evaluation for details):  ADL's;IADL's    Body Structure / Function / Physical Skills  ADL;FMC;ROM;IADL;Pain;Coordination    Rehab Potential  Fair    Clinical Decision Making  Several treatment options, min-mod task modification necessary  OT Frequency  2x / week    OT Duration  12 weeks    OT Treatment/Interventions  Self-care/ADL training;Patient/family education;DME and/or AE instruction;Therapeutic exercise;Moist Heat;Neuromuscular education    Consulted and Agree with Plan of  Care  Patient       Patient will benefit from skilled therapeutic intervention in order to improve the following deficits and impairments:   Body Structure / Function / Physical Skills: ADL, FMC, ROM, IADL, Pain, Coordination       Visit Diagnosis: 1. Muscle weakness (generalized)   2. Other lack of coordination       Problem List Patient Active Problem List   Diagnosis Date Noted  . Acute CVA (cerebrovascular accident) ([place]) 05/02/2016  . Left-sided weakness 05/01/2016    Chrys Racer, OTR/L, [facility] ascom [phone] 10/26/18, 3:03 PM  [place] MAIN [facility] SERVICES [address] [place], [place], [address] Phone: [phone]   Fax:  [phone]  Name: Curtis Powell MRN: [id number] Date of Birth: [birth year]

## 2018-10-26 NOTE — Therapy (Signed)
[place] MAIN [facility] SERVICES [address] [place], [place], [address] Phone: [phone]   Fax:  [phone]  Physical Therapy Treatment/RECERT  Patient Details  Name: Curtis Powell MRN: [id number] Date of Birth: 1966-03-10 No data recorded  Encounter Date: 10/26/2018  PT End of Session - 10/26/18 1704    Visit Number  123    Number of Visits  139    Date for PT Re-Evaluation  12/21/18    Authorization Type  3/10 with PN on 10/07/18    Authorization Time Period  authorized 05/12/18-11/10/18    PT Start Time  1346    PT Stop Time  1429    PT Time Calculation (min)  43 min    Equipment Utilized During Treatment  Gait belt    Activity Tolerance  Patient tolerated treatment well    Behavior During Therapy  WFL for tasks assessed/performed       Past Medical History:  Diagnosis Date  . GERD (gastroesophageal reflux disease)   . Hyperlipidemia   . Hypertension   . Obesity   . Stroke [facility])     Past Surgical History:  Procedure Laterality Date  . BRAIN SURGERY      There were no vitals filed for this visit.  Subjective Assessment - 10/26/18 1352    Subjective  Patient reports he is having some pain in the L middle finger, started last night, is getting slightly better. Has been compliant with HEP. Reports doctor said to put ice on it to make it feel better.    Pertinent History   Patient is a pleasant 53 year old male who presents to physical therapy for weakness and immobility secondary to CVA.  Had a stroke in Feb 2018. He was previously fully independent, but this stroke caused severe residual deficits, mainly on the right side as well as speech, and now he is unable to walk or perform most of his ADLs on his own. Entire right side is very weak. He still has a little difficulty with speech but his swallowing is improved to baseline. His mom had to move in with him and now is his main caregiver.     Currently in Pain?  Yes    Pain Score  8      Pain Location  Finger (Comment which one)    Pain Orientation  Left   middle finger   Pain Descriptors / Indicators  Stabbing    Pain Type  Acute pain    Pain Radiating Towards  shoots down the finger    Pain Onset  Yesterday    Pain Frequency  Intermittent         RECERT-goals redone 7/1     Ther-ex  Sit to stand 10x with max cueing for sequencing/gluteal squeeze, Min A to reduce "plopping" of poor eccentric control; 10 second holds   Ambulate with RW and CGA scanning room to pick up cones in peripheral vision. Frequent cues for upright posture and to increase hip flexion to improve toe clearance during swing. Ambulate 70 ft with tactile cues to PSIS and ASIS for anterior weight shift/step length.    Standing balloon taps reaching inside and outside BOS , mod a to retain  COM x 4 minutes   Static stand with decreasing UE support, able to decrease to no UE support for ~8 seconds with Min A to retain COM x 5 minutes total   Seated: GTB hamstring curls 15x each LE against PT  resistance.   seated upright posture marches for core activation with arms crossed against chest x 15 each LE 2" step toe taps seated for coordination; sequencing, muscle activation and positional awareness 30 seconds x 2 trials   Bathroom mobility. Increasing independence with no assistance required for dressing, supervision only. Requires set up in bathroom   Pt educated throughout session about proper posture and technique with exercises. Improved exercise technique, movement at target joints, use of target muscles after min to mod verbal, visual, tactile cues.                           PT Education - 10/26/18 1426    Education provided  Yes    Education Details  exercise technique/stability    Person(s) Educated  Patient    Methods  Explanation;Demonstration;Tactile cues;Verbal cues    Comprehension  Verbalized understanding;Returned demonstration;Verbal cues required;Tactile  cues required       PT Short Term Goals - 10/07/18 1424      PT SHORT TERM GOAL #1   Title  Patient will be independent in home exercise program to improve strength/mobility for better functional independence with ADLs.    Baseline  hep compliant    Time  2    Period  Weeks    Status  Achieved      PT SHORT TERM GOAL #2   Title  Patient will require min cueing for STS transfer with CGA for increased independence with mobility.     Baseline  CGA    Time  2    Period  Weeks    Status  Achieved      PT SHORT TERM GOAL #3   Title  Patient will maintain upright posture for > 15 seconds to demonstrate strengthened postural control muscualture     Baseline  18 seconds     Time  2    Period  Weeks    Status  Achieved        PT Long Term Goals - 10/07/18 0001      PT LONG TERM GOAL #2   Title  Patient (< 64 years old) will complete five times sit to stand test in < 10 seconds indicating an increased LE strength and improved balance.    Baseline  5/19: 20.6s from [facility], one hand on walker and one on WC, 80% upright posture 7/1: 22 seconds one hand on walker one hand on w/c     Time  8    Period  Weeks    Status  Partially Met    Target Date  10/20/18      PT LONG TERM GOAL #4   Title  Patient will increase BLE gross strength to 4+/5 as to improve functional strength for independent gait, increased standing tolerance and increased ADL ability.    Baseline   4-/5 RLE, 10/15: 4/5 RLE 11/7: 4/5 RLE 3/3: 4/5 7/1:  hip flexion 4/5 hip extension 2/5 hip abduction/adduciton 2/5     Time  8    Period  Weeks    Status  On-going    Target Date  10/20/18      PT LONG TERM GOAL #5   Title  Patient will increase lower extremity functional scale to >40/80 to demonstrate improved functional mobility and increased tolerance with ADLs.     Baseline  14/80; 12/26: 21/80; 2/18: 28/80  4/10; 25/80 5/29: 27/80; 7/16: 29/80 8/22: 30/80  9/12: 26/80 (feeling more  tired due to moving) 10/15: 37/80 11/7:  23/80 3/3: 32/80; 08/25/18: 28/80    Time  8    Period  Weeks    Status  Partially Met    Target Date  10/20/18      PT LONG TERM GOAL #8   Title  Patient will perform 10 MWT in < 1 minute to demonstrate improved ambulatory velocity and capacity.     Baseline  4/10: 2 minutes 45 seconds; 5/29: 2 minute 31 seconds; 3 minutes 22 seconds 8/22: 3 minutes 2 seconds  9/12:  2 minutes and 43 seconds 10/15: 64mnute 12 seconds 11/7: 1 minute 21 seconds 12/19: 1 min 16 seconds 3/3: 57 seconds; 08/26/18: 1:41.4s= 0.10 m/s 7/1: 1 minute 14 seconds    Time  8    Period  Weeks    Status  Partially Met    Target Date  10/20/18      PT LONG TERM GOAL  #9   TITLE  Patient will increase Berg Balance score by > 6 points (26/56) to demonstrate decreased fall risk during functional activities.    Baseline  9/12: 20/56 10/15: 21/56 11/7: 20/56 12/19: 22/56; 08/26/18: 11/56 7/1: 17/56     Time  8    Period  Weeks    Status  Partially Met    Target Date  10/20/18      PT LONG TERM GOAL  #13   TITLE  Patient will ambulate 150 ft with least assistive device and Supervision with no breaks to allow for increased mobility within home.     Baseline  11/7: 107f12/19: 115 ft  3/3: will assess next session, has done 200 ft with one rest break; 08/26/18: Pt fatigued after 30' today    Time  8    Period  Weeks    Status  Partially Met    Target Date  10/20/18            Plan - 10/26/18 1713    Clinical Impression Statement  Patient's goals performed on 10/07/18  ( 3 sessions ago) please see that note for recording of progression. Patient missed last week's sessions due to transportation issues. Progression of stability and mobility is noted with progression to ambulate with head turns for room scanning for objects without LOB. Patient continues to require assistance to retain upright trunk position due to posterior LOB without UE support. Pt will benefit from PT services to address deficits in strength, balance, and  mobility in order to return to full function at home    Rehab Potential  Fair    Clinical Impairments Affecting Rehab Potential  hx of HTN, HLD, CVA, learning disability, Diabetes, brain tumor,     PT Frequency  2x / week    PT Duration  8 weeks    PT Treatment/Interventions  ADLs/Self Care Home Management;Aquatic Therapy;Ultrasound;Moist Heat;Traction;DME Instruction;Gait training;Stair training;Functional mobility training;Therapeutic activities;Therapeutic exercise;Orthotic Fit/Training;Neuromuscular re-education;Balance training;Patient/family education;Manual techniques;Wheelchair mobility training;Passive range of motion;Energy conservation;Taping;Visual/perceptual remediation/compensation    PT Next Visit Plan  standing stability with reach to be able to pull up pants independently    PT Home Exercise Plan  TrA contraction, glute sets: needs updates. Potentially some time in supine flat in hospital bed, to improve hip extension deficits.     Consulted and Agree with Plan of Care  Patient       Patient will benefit from skilled therapeutic intervention in order to improve the following deficits and impairments:  Abnormal gait, Decreased activity tolerance,  Decreased balance, Decreased knowledge of precautions, Decreased endurance, Decreased coordination, Decreased knowledge of use of DME, Decreased mobility, Decreased range of motion, Difficulty walking, Decreased safety awareness, Decreased strength, Impaired flexibility, Impaired perceived functional ability, Impaired tone, Postural dysfunction, Improper body mechanics, Pain  Visit Diagnosis: 1. Muscle weakness (generalized)   2. Other lack of coordination   3. Other abnormalities of gait and mobility        Problem List Patient Active Problem List   Diagnosis Date Noted  . Acute CVA (cerebrovascular accident) ([place]) 05/02/2016  . Left-sided weakness 05/01/2016   Janna Arch, PT, DPT   10/26/2018, 5:14 PM  [place] MAIN [facility] SERVICES [address] [place], [place], [address] Phone: [phone]   Fax:  [phone]  Name: Curtis Powell MRN: [id number] Date of Birth: [birth year]

## 2018-10-28 ENCOUNTER — Encounter: Payer: Self-pay | Admitting: Occupational Therapy

## 2018-10-28 ENCOUNTER — Ambulatory Visit: Payer: Medicare HMO | Admitting: Physical Therapy

## 2018-10-28 ENCOUNTER — Other Ambulatory Visit: Payer: Self-pay

## 2018-10-28 ENCOUNTER — Encounter: Payer: Self-pay | Admitting: Physical Therapy

## 2018-10-28 ENCOUNTER — Ambulatory Visit: Payer: Medicare HMO | Admitting: Occupational Therapy

## 2018-10-28 DIAGNOSIS — R278 Other lack of coordination: Secondary | ICD-10-CM

## 2018-10-28 DIAGNOSIS — M6281 Muscle weakness (generalized): Secondary | ICD-10-CM

## 2018-10-28 DIAGNOSIS — R2689 Other abnormalities of gait and mobility: Secondary | ICD-10-CM

## 2018-10-28 DIAGNOSIS — I69351 Hemiplegia and hemiparesis following cerebral infarction affecting right dominant side: Secondary | ICD-10-CM

## 2018-10-28 NOTE — Therapy (Signed)
[place] MAIN [facility] SERVICES [address] [place], [place], [address] Phone: [phone]   Fax:  [phone]  Occupational Therapy Treatment  Patient Details  Name: Curtis Powell MRN: [id number] Date of Birth: [birth year] No data recorded  Encounter Date: 10/28/2018  OT End of Session - 10/28/18 1444    Visit Number  110    Number of Visits  113    Date for OT Re-Evaluation  11/17/18    Authorization Type  Progress reporting period starting 09/14/2018    OT Start Time  1435    OT Stop Time  1515    OT Time Calculation (min)  40 min    Equipment Utilized During Treatment  Long handled shoe horn    Activity Tolerance  Patient tolerated treatment well    Behavior During Therapy  WFL for tasks assessed/performed       Past Medical History:  Diagnosis Date  . GERD (gastroesophageal reflux disease)   . Hyperlipidemia   . Hypertension   . Obesity   . Stroke [facility])     Past Surgical History:  Procedure Laterality Date  . BRAIN SURGERY      There were no vitals filed for this visit.  Subjective Assessment - 10/28/18 1442    Subjective   Pt. reports that his aunt passed away.    Patient is accompanied by:  Family member    Pertinent History  Pt. is a 53 y.o. male who suffered a CVA on 05/01/2016. Pt. was admitted to the hospital. Once discharged, he received Home Health PT and OT services for about a month. Pt. has had multiple CVAs over the past 8 years, and has had multiple falls in the past 6 months. Pt. resides in an apartment. Pt. has caregivers for 80 hours. Pt.'s mother stays with pt. at night, and assists with IADL tasks.      Patient Stated Goals  To be able to throw a ball and dribble a ball, do as much as I can for myself.     Pain Score  9     Pain Location  Finger (Comment which one)    Pain Orientation  Left    Pain Descriptors / Indicators  Aching    Pain Type  Acute pain    Pain Onset  Yesterday    Pain Frequency   Intermittent      OT TREATMENT    Goals were reviewed with the pt.   Self-care:  Pt. Worked on writing tasks, in printed form with 25% legibility. Pt. Was able to stabilize a pen with a built up foam handle while writing. Pt. deviation above the line.                        OT Education - 10/28/18 1444    Education provided  Yes    Education Details  right hand [facility] skills    Person(s) Educated  Patient    Methods  Explanation;Demonstration    Comprehension  Verbalized understanding;Returned demonstration;Verbal cues required    Education Details  HEP    Person(s) Educated  Patient    Methods  Explanation    Comprehension  Verbalized understanding       OT Short Term Goals - 03/03/17 1459      OT SHORT TERM GOAL #1   Title  `        OT Long Term Goals - 10/28/18 1447  OT LONG TERM GOAL #2   Title  Pt. will complete self grooming with minA      OT LONG TERM GOAL #4   Title  Pt. will perform self dressing with minA and A/E as needed.    Baseline  Pt. continues to improve LB dressing in standing for hiking clothing. Pt.  continues to require increased time and mod A donn socks, and pants over his feet. Independent standing to hike clothing. Pt. requires min A donning a jacket, and Independent doning a shirt.    Time  12    Period  Weeks    Status  On-going    Target Date  11/17/18      OT LONG TERM GOAL #6   Title  Pt. will write his name efficiently with 100% legibility    Baseline  Pt continues to present with limited legibility and efficiency when writing name to sign in when riding the [place]    Time  12    Period  Weeks    Status  On-going    Target Date  11/17/18      OT LONG TERM GOAL #7   Title  Pt will independently and consistently follow HEP to increase UE strength to increase functional independence    Baseline  Pt to continues to work towards independence with exercises    Time  12    Period  Weeks    Status  On-going     Target Date  11/17/18      OT LONG TERM GOAL #8   Title  Pt. will require supervision ironing a shirt.    Baseline  Pt. requires minA, and complete set-up seated using a tabletop iron    Time  12    Period  Weeks    Status  On-going    Target Date  11/17/18      OT LONG TERM GOAL  #9   Baseline  Pt. will independently be able to sew a button onto a shirt.    Time  12    Period  Weeks    Target Date  11/17/18      OT LONG TERM GOAL  #10   TITLE  Pt. will independently be able to throw a ball    Baseline  Pt.is limited    Time  12    Period  Weeks    Status  On-going    Target Date  11/17/18      OT LONG TERM GOAL  #11   TITLE  Pt. will improve UE functional reaching to be able to independently use his RUE to hand his clothes in the closest.    Baseline  Pt. continues to have difficulty.    Time  12    Period  Weeks    Status  On-going    Target Date  11/17/18            Plan - 10/28/18 1504    Clinical Impression Statement  Pt. has pain in his left 3rd digit. Pt. continues to make steady progress with bilateral UE functional reaching into cabinetry Pt. continues to work on improving [facility] skills needed for writing legibly, and efficiently. Pt. continues to work on improving BUE strength, and [facility] skills in order to improve overall ADL, IADL functioning, reaching, writing, buttoning, ironing, and donning his socks.    OT Occupational Profile and History  Problem Focused Assessment - Including review of records relating to presenting problem  Occupational performance deficits (Please refer to evaluation for details):  ADL's;IADL's    Body Structure / Function / Physical Skills  ADL;FMC;ROM;IADL;Pain;Coordination    Rehab Potential  Fair    Clinical Decision Making  Several treatment options, min-mod task modification necessary    OT Frequency  2x / week    OT Duration  12 weeks    OT Treatment/Interventions  Self-care/ADL training;Patient/family education;DME and/or AE  instruction;Therapeutic exercise;Moist Heat;Neuromuscular education    Consulted and Agree with Plan of Care  Patient       Patient will benefit from skilled therapeutic intervention in order to improve the following deficits and impairments:   Body Structure / Function / Physical Skills: ADL, FMC, ROM, IADL, Pain, Coordination       Visit Diagnosis: 1. Muscle weakness (generalized)   2. Other lack of coordination       Problem List Patient Active Problem List   Diagnosis Date Noted  . Acute CVA (cerebrovascular accident) ([place]) 05/02/2016  . Left-sided weakness 05/01/2016    Harrel Carina, MS, OTR/L 10/28/2018, 3:22 PM  [place] MAIN [facility] SERVICES [address] [place], [place], [address] Phone: [phone]   Fax:  [phone]  Name: SYLVESTRE RATHGEBER MRN: [id number] Date of Birth: 04-06-1966

## 2018-10-28 NOTE — Therapy (Signed)
[place] MAIN [facility] SERVICES [address] [place], [place], [address] Phone: [phone]   Fax:  [phone]  Physical Therapy Treatment  Patient Details  Name: Curtis Powell MRN: [id number] Date of Birth: 02-01-1966 No data recorded  Encounter Date: 10/28/2018  PT End of Session - 10/28/18 1414    Visit Number  124    Number of Visits  139    Date for PT Re-Evaluation  12/21/18    Authorization Type  3/10 with PN on 10/07/18    Authorization Time Period  authorized 05/12/18-11/10/18    PT Start Time  0145    PT Stop Time  0225    PT Time Calculation (min)  40 min    Equipment Utilized During Treatment  Gait belt    Activity Tolerance  Patient tolerated treatment well    Behavior During Therapy  WFL for tasks assessed/performed       Past Medical History:  Diagnosis Date  . GERD (gastroesophageal reflux disease)   . Hyperlipidemia   . Hypertension   . Obesity   . Stroke [facility])     Past Surgical History:  Procedure Laterality Date  . BRAIN SURGERY      There were no vitals filed for this visit.  Subjective Assessment - 10/28/18 1412    Subjective  Patient reports he is having some pain in the L middle finger, started last night, is getting slightly better. Has been compliant with HEP. Reports doctor said to put ice on it to make it feel better.    Pertinent History   Patient is a pleasant 53 year old male who presents to physical therapy for weakness and immobility secondary to CVA.  Had a stroke in Feb 2018. He was previously fully independent, but this stroke caused severe residual deficits, mainly on the right side as well as speech, and now he is unable to walk or perform most of his ADLs on his own. Entire right side is very weak. He still has a little difficulty with speech but his swallowing is improved to baseline. His mom had to move in with him and now is his main caregiver.     Limitations   Sitting;Lifting;Standing;Walking;House hold activities    How long can you sit comfortably?  5 minutes    How long can you stand comfortably?  5 minutes    How long can you walk comfortably?  10 ft    Patient Stated Goals  Walk without walker, walk further with walker, get some strength in back     Currently in Pain?  Yes    Pain Score  5     Pain Location  Finger (Comment which one)    Pain Orientation  Left    Pain Descriptors / Indicators  Aching    Pain Onset  Yesterday       Treatment: Nu-step x 5 mins BUE and BLE LAQ with 5 lbs and 3 sec hold x 15 x 2    Gait training with RW and 100 feet, 75 feet and 110 feet with longer step length, lateral weight shift and vC for upright posture. Transfer wc <> stand, wc <> nu-step with CGA x 1                         PT Education - 10/28/18 1413    Education provided  Yes    Education Details  HEP    Person(s)  Educated  Patient    Methods  Explanation    Comprehension  Verbalized understanding       PT Short Term Goals - 10/26/18 1719      PT SHORT TERM GOAL #1   Title  Patient will be independent in home exercise program to improve strength/mobility for better functional independence with ADLs.    Baseline  hep compliant    Time  2    Period  Weeks    Status  Achieved      PT SHORT TERM GOAL #2   Title  Patient will require min cueing for STS transfer with CGA for increased independence with mobility.     Baseline  CGA    Time  2    Period  Weeks    Status  Achieved      PT SHORT TERM GOAL #3   Title  Patient will maintain upright posture for > 15 seconds to demonstrate strengthened postural control muscualture     Baseline  18 seconds     Time  2    Period  Weeks    Status  Achieved        PT Long Term Goals - 10/26/18 0001      PT LONG TERM GOAL #1   Title  Patient will perform 10 MWT in < 1 minute to demonstrate improved ambulatory velocity and capacity.     Baseline  4/10: 2 minutes 45  seconds; 5/29: 2 minute 31 seconds; 3 minutes 22 seconds 8/22: 3 minutes 2 seconds  9/12:  2 minutes and 43 seconds 10/15: 76mnute 12 seconds 11/7: 1 minute 21 seconds 12/19: 1 min 16 seconds 3/3: 57 seconds; 08/26/18: 1:41.4s= 0.10 m/s 7/1: 1 minute 14 seconds    Time  8    Period  Weeks    Status  Partially Met    Target Date  12/21/18      PT LONG TERM GOAL #2   Title  Patient (< 645years old) will complete five times sit to stand test in < 10 seconds indicating an increased LE strength and improved balance.    [facility]  [facility] and one on [facility], 80% upright posture 7/1: 22 seconds one hand on walker one hand on w/c     Time  8    Period  Weeks    Status  Partially Met    Target Date  12/21/18      PT LONG TERM GOAL #3   Title  Patient will increase Berg Balance score by > 6 points (26/56) to demonstrate decreased fall risk during functional activities.    Baseline  9/12: 20/56 10/15: 21/56 11/7: 20/56 12/19: 22/56; 08/26/18: 11/56 7/1: 17/56     Time  8    Period  Weeks    Status  Partially Met    Target Date  12/21/18      PT LONG TERM GOAL #4   Title  Patient will increase BLE gross strength to 4+/5 as to improve functional strength for independent gait, increased standing tolerance and increased ADL ability.    Baseline   4-/5 RLE, 10/15: 4/5 RLE 11/7: 4/5 RLE 3/3: 4/5 7/1:  hip flexion 4/5 hip extension 2/5 hip abduction/adduciton 2/5     Time  8    Period  Weeks    Status  On-going    Target Date  12/21/18      PT LONG TERM GOAL #5  Title  Patient will increase lower extremity functional scale to >40/80 to demonstrate improved functional mobility and increased tolerance with ADLs.     Baseline  14/80; 12/26: 21/80; 2/18: 28/80  4/10; 25/80 5/29: 27/80; 7/16: 29/80 8/22: 30/80  9/12: 26/80 (feeling more tired due to moving) 10/15: 37/80 11/7: 23/80 3/3: 32/80; 08/25/18: 28/80    Time  8    Period  Weeks    Status  Partially Met    Target Date   12/21/18      PT LONG TERM GOAL  #13   TITLE  Patient will ambulate 150 ft with least assistive device and Supervision with no breaks to allow for increased mobility within home.     Baseline  11/7: 13f 12/19: 115 ft  3/3: will assess next session, has done 200 ft with one rest break; 08/26/18: Pt fatigued after 30' today    Time  8    Period  Weeks    Status  Partially Met    Target Date  12/21/18      PT LONG TERM GOAL  #14   TITLE  Patient will decrease 10 meter walk test to <30 seconds as to improve gait speed for better community ambulation and to reduce fall risk.    Baseline  3/3: 57 seconds with RW and CGA; 08/26/18: 1:41    Time  8    Period  Weeks    Status  On-going    Target Date  12/21/18            Plan - 10/28/18 1414    Clinical Impression Statement  Patient demonstrates better quadriceps control and requires only minimal cueing to activate. Patient demonstrates decreased muscular strength in the L LE with not consistently being able to have complete extension in stance phase of gait , and will benefit from further skilled therapy focused on improving motor control and muscular strength/endurance to return to prior level of function.    Rehab Potential  Fair    Clinical Impairments Affecting Rehab Potential  hx of HTN, HLD, CVA, learning disability, Diabetes, brain tumor,     PT Frequency  2x / week    PT Duration  8 weeks    PT Treatment/Interventions  ADLs/Self Care Home Management;Aquatic Therapy;Ultrasound;Moist Heat;Traction;DME Instruction;Gait training;Stair training;Functional mobility training;Therapeutic activities;Therapeutic exercise;Orthotic Fit/Training;Neuromuscular re-education;Balance training;Patient/family education;Manual techniques;Wheelchair mobility training;Passive range of motion;Energy conservation;Taping;Visual/perceptual remediation/compensation    PT Next Visit Plan  standing stability with reach to be able to pull up pants independently     PT Home Exercise Plan  TrA contraction, glute sets: needs updates. Potentially some time in supine flat in hospital bed, to improve hip extension deficits.     Consulted and Agree with Plan of Care  Patient       Patient will benefit from skilled therapeutic intervention in order to improve the following deficits and impairments:  Abnormal gait, Decreased activity tolerance, Decreased balance, Decreased knowledge of precautions, Decreased endurance, Decreased coordination, Decreased knowledge of use of DME, Decreased mobility, Decreased range of motion, Difficulty walking, Decreased safety awareness, Decreased strength, Impaired flexibility, Impaired perceived functional ability, Impaired tone, Postural dysfunction, Improper body mechanics, Pain  Visit Diagnosis: 1. Muscle weakness (generalized)   2. Other lack of coordination   3. Other abnormalities of gait and mobility   4. Hemiplegia and hemiparesis following cerebral infarction affecting right dominant side ([facility]        Problem List Patient Active Problem List   Diagnosis Date Noted  .  Acute CVA (cerebrovascular accident) ([place]) 05/02/2016  . Left-sided weakness 05/01/2016    Alanson Puls, [place] DPT 10/28/2018, 2:24 PM  [place] MAIN [facility] SERVICES [address] [place], [place], [address] Phone: [phone]   Fax:  [phone]  Name: URA HAUSEN MRN: [id number] Date of Birth: 1965/08/10

## 2018-11-02 ENCOUNTER — Encounter: Payer: Self-pay | Admitting: Physical Therapy

## 2018-11-02 ENCOUNTER — Ambulatory Visit: Payer: Medicare HMO | Admitting: Physical Therapy

## 2018-11-02 ENCOUNTER — Ambulatory Visit: Payer: Medicare HMO | Admitting: Occupational Therapy

## 2018-11-02 ENCOUNTER — Other Ambulatory Visit: Payer: Self-pay

## 2018-11-02 ENCOUNTER — Encounter: Payer: Self-pay | Admitting: Occupational Therapy

## 2018-11-02 DIAGNOSIS — R2689 Other abnormalities of gait and mobility: Secondary | ICD-10-CM

## 2018-11-02 DIAGNOSIS — R278 Other lack of coordination: Secondary | ICD-10-CM

## 2018-11-02 DIAGNOSIS — M6281 Muscle weakness (generalized): Secondary | ICD-10-CM

## 2018-11-02 DIAGNOSIS — I69351 Hemiplegia and hemiparesis following cerebral infarction affecting right dominant side: Secondary | ICD-10-CM

## 2018-11-02 NOTE — Therapy (Signed)
[name] MAIN [facility] SERVICES [address] [place], [place], [address] Phone: [phone]   Fax:  [phone]  Physical Therapy Treatment  Patient Details  Name: Curtis Powell MRN: [id number] Date of Birth: 1965-12-27 No data recorded  Encounter Date: 11/02/2018  PT End of Session - 11/02/18 1435    Visit Number  125    Number of Visits  139    Date for PT Re-Evaluation  12/21/18    Authorization Type  3/10 with PN on 10/07/18    Authorization Time Period  authorized 05/12/18-11/10/18    PT Start Time  1432    PT Stop Time  1515    PT Time Calculation (min)  43 min    Equipment Utilized During Treatment  Gait belt    Activity Tolerance  Patient tolerated treatment well    Behavior During Therapy  WFL for tasks assessed/performed       Past Medical History:  Diagnosis Date  . GERD (gastroesophageal reflux disease)   . Hyperlipidemia   . Hypertension   . Obesity   . Stroke [facility])     Past Surgical History:  Procedure Laterality Date  . BRAIN SURGERY      There were no vitals filed for this visit.  Subjective Assessment - 11/02/18 1434    Subjective  Patient reports doing well; He reports adherence with HEP and has been doing seated tband and sit to stand exercises regularly; denies any new falls; denies any sorenss;    Pertinent History   Patient is a pleasant 53 year old male who presents to physical therapy for weakness and immobility secondary to CVA.  Had a stroke in Feb 2018. He was previously fully independent, but this stroke caused severe residual deficits, mainly on the right side as well as speech, and now he is unable to walk or perform most of his ADLs on his own. Entire right side is very weak. He still has a little difficulty with speech but his swallowing is improved to baseline. His mom had to move in with him and now is his main caregiver.     Limitations  Sitting;Lifting;Standing;Walking;House hold activities    How long  can you sit comfortably?  5 minutes    How long can you stand comfortably?  5 minutes    How long can you walk comfortably?  10 ft    Patient Stated Goals  Walk without walker, walk further with walker, get some strength in back     Currently in Pain?  No/denies    Pain Onset  Yesterday    Multiple Pain Sites  No            TREATMENT: Ther-ex Side stepping in parallel bars red tband x2 laps each direction with B rail assist with CGA for safety;  Standing marches with max VCs for erect posture and to improve hip extension on stance leg for better  Balance and stance control; also instructed patient to increase hip flexion AROM for better hip strengthening x10 reps bilaterally;  Sit to stand x5 with max cueing for sequencing/gluteal squeeze, Min A to reduce "plopping" of poor eccentric control;   Gait training: Ambulate with RW and CGA  Frequent cues for upright posture and to increase hip flexion to improve toe clearance during swing.[id number] with tactile cues to PSIS and ASIS for anterior weight shift/step length. Patient exhibits increased PF at heel strike with toe down at initial contact. He does exhibit improved  step length but with increased fatigue will exhibit increased flexed posture. Patient also compensates with increased pelvic rotation for increased step length with increased fatigue, exhibiting decreased hip flexion during swing.   Pt educated throughout session about proper posture and technique with exercises. Improved exercise technique, movement at target joints, use of target muscles after min to mod verbal, visual, tactile cues.                   PT Education - 11/02/18 1435    Education provided  Yes    Education Details  LE strengthening, gait safety, HEP    Person(s) Educated  Patient    Methods  Explanation;Verbal cues    Comprehension  Verbalized understanding;Returned demonstration;Verbal cues required;Need further instruction        PT Short Term Goals - 10/26/18 1719      PT SHORT TERM GOAL #1   Title  Patient will be independent in home exercise program to improve strength/mobility for better functional independence with ADLs.    Baseline  hep compliant    Time  2    Period  Weeks    Status  Achieved      PT SHORT TERM GOAL #2   Title  Patient will require min cueing for STS transfer with CGA for increased independence with mobility.     Baseline  CGA    Time  2    Period  Weeks    Status  Achieved      PT SHORT TERM GOAL #3   Title  Patient will maintain upright posture for > 15 seconds to demonstrate strengthened postural control muscualture     Baseline  18 seconds     Time  2    Period  Weeks    Status  Achieved        PT Long Term Goals - 10/26/18 0001      PT LONG TERM GOAL #1   Title  Patient will perform 10 MWT in < 1 minute to demonstrate improved ambulatory velocity and capacity.     Baseline  4/10: 2 minutes 45 seconds; 5/29: 2 minute 31 seconds; 3 minutes 22 seconds 8/22: 3 minutes 2 seconds  9/12:  2 minutes and 43 seconds 10/15: 29mnute 12 seconds 11/7: 1 minute 21 seconds 12/19: 1 min 16 seconds 3/3: 57 seconds; 08/26/18: 1:41.4s= 0.10 m/s 7/1: 1 minute 14 seconds    Time  8    Period  Weeks    Status  Partially Met    Target Date  12/21/18      PT LONG TERM GOAL #2   Title  Patient (< 640years old) will complete five times sit to stand test in < 10 seconds indicating an increased LE strength and improved balance.    Baseline  5/19: 20.6s from [facility] one hand on walker and one on WC, 80% upright posture 7/1: 22 seconds one hand on walker one hand on w/c     Time  8    Period  Weeks    Status  Partially Met    Target Date  12/21/18      PT LONG TERM GOAL #3   Title  Patient will increase Berg Balance score by > 6 points (26/56) to demonstrate decreased fall risk during functional activities.    Baseline  9/12: 20/56 10/15: 21/56 11/7: 20/56 12/19: 22/56; 08/26/18: 11/56 7/1:  17/56     Time  8    Period  Weeks  Status  Partially Met    Target Date  12/21/18      PT LONG TERM GOAL #4   Title  Patient will increase BLE gross strength to 4+/5 as to improve functional strength for independent gait, increased standing tolerance and increased ADL ability.    Baseline   4-/5 RLE, 10/15: 4/5 RLE 11/7: 4/5 RLE 3/3: 4/5 7/1:  hip flexion 4/5 hip extension 2/5 hip abduction/adduciton 2/5     Time  8    Period  Weeks    Status  On-going    Target Date  12/21/18      PT LONG TERM GOAL #5   Title  Patient will increase lower extremity functional scale to >40/80 to demonstrate improved functional mobility and increased tolerance with ADLs.     Baseline  14/80; 12/26: 21/80; 2/18: 28/80  4/10; 25/80 5/29: 27/80; 7/16: 29/80 8/22: 30/80  9/12: 26/80 (feeling more tired due to moving) 10/15: 37/80 11/7: 23/80 3/3: 32/80; 08/25/18: 28/80    Time  8    Period  Weeks    Status  Partially Met    Target Date  12/21/18      PT LONG TERM GOAL  #13   TITLE  Patient will ambulate 150 ft with least assistive device and Supervision with no breaks to allow for increased mobility within home.     Baseline  11/7: 164f 12/19: 115 ft  3/3: will assess next session, has done 200 ft with one rest break; 08/26/18: Pt fatigued after 30' today    Time  8    Period  Weeks    Status  Partially Met    Target Date  12/21/18      PT LONG TERM GOAL  #14   TITLE  Patient will decrease 10 meter walk test to <30 seconds as to improve gait speed for better community ambulation and to reduce fall risk.    Baseline  3/3: 57 seconds with RW and CGA; 08/26/18: 1:41    Time  8    Period  Weeks    Status  On-going    Target Date  12/21/18            Plan - 11/02/18 1530    Clinical Impression Statement  Patient motivated and participated well within session. Advanced LE strengthening adding side step with red tband for resistance. He does exhibit increased forward flexed psoture with increased  fatigue. Patient required increased cues for erect posture and to increase hip flexion with swing phase for better gait mechanics. Patient would benefit from additional skilled PT Intervention to improve strength, balance and gait safety;    Rehab Potential  Fair    Clinical Impairments Affecting Rehab Potential  hx of HTN, HLD, CVA, learning disability, Diabetes, brain tumor,     PT Frequency  2x / week    PT Duration  8 weeks    PT Treatment/Interventions  ADLs/Self Care Home Management;Aquatic Therapy;Ultrasound;Moist Heat;Traction;DME Instruction;Gait training;Stair training;Functional mobility training;Therapeutic activities;Therapeutic exercise;Orthotic Fit/Training;Neuromuscular re-education;Balance training;Patient/family education;Manual techniques;Wheelchair mobility training;Passive range of motion;Energy conservation;Taping;Visual/perceptual remediation/compensation    PT Next Visit Plan  standing stability with reach to be able to pull up pants independently    PT Home Exercise Plan  TrA contraction, glute sets: needs updates. Potentially some time in supine flat in hospital bed, to improve hip extension deficits.     Consulted and Agree with Plan of Care  Patient       Patient will benefit from skilled  therapeutic intervention in order to improve the following deficits and impairments:  Abnormal gait, Decreased activity tolerance, Decreased balance, Decreased knowledge of precautions, Decreased endurance, Decreased coordination, Decreased knowledge of use of DME, Decreased mobility, Decreased range of motion, Difficulty walking, Decreased safety awareness, Decreased strength, Impaired flexibility, Impaired perceived functional ability, Impaired tone, Postural dysfunction, Improper body mechanics, Pain  Visit Diagnosis: 1. Muscle weakness (generalized)   2. Other lack of coordination   3. Other abnormalities of gait and mobility        Problem List Patient Active Problem List    Diagnosis Date Noted  . Acute CVA (cerebrovascular accident) ([place]) 05/02/2016  . Left-sided weakness 05/01/2016    Trotter,Margaret PT, DPT 11/02/2018, 3:32 PM  [place] MAIN [facility] SERVICES [address] [place], [place], [address] Phone: [phone]   Fax:  [phone]  Name: Curtis Powell MRN: [id number] Date of Birth: [birth year]

## 2018-11-03 NOTE — Therapy (Signed)
[place] MAIN [facility] SERVICES [address] [place], [place], [address] Phone: [phone]   Fax:  [phone]  Occupational Therapy Treatment  Patient Details  Name: Curtis Powell MRN: [id number] Date of Birth: 21-Aug-1965 No data recorded  Encounter Date: 11/02/2018  OT End of Session - 11/03/18 1450    Visit Number  111    Number of Visits  113    Date for OT Re-Evaluation  11/17/18    Authorization Type  Progress reporting period starting 09/14/2018    OT Start Time  1345    OT Stop Time  1430    OT Time Calculation (min)  45 min    Activity Tolerance  Patient tolerated treatment well    Behavior During Therapy  [facility] for tasks assessed/performed       Past Medical History:  Diagnosis Date  . GERD (gastroesophageal reflux disease)   . Hyperlipidemia   . Hypertension   . Obesity   . Stroke [facility])     Past Surgical History:  Procedure Laterality Date  . BRAIN SURGERY      There were no vitals filed for this visit.  Subjective Assessment - 11/02/18 1409    Subjective   Patient reports he is doing well, wants to work on coordination today.  His aunt passed away and had a service this past week.    Pertinent History  Pt. is a 53 y.o. male who suffered a CVA on 05/01/2016. Pt. was admitted to the hospital. Once discharged, he received Home Health PT and OT services for about a month. Pt. has had multiple CVAs over the past 8 years, and has had multiple falls in the past 6 months. Pt. resides in an apartment. Pt. has caregivers for 80 hours. Pt.'s mother stays with pt. at night, and assists with IADL tasks.      Patient Stated Goals  To be able to throw a ball and dribble a ball, do as much as I can for myself.     Currently in Pain?  No/denies    Pain Score  0-No pain        ADL:  Patient seen this date for focus on toileting and transfers.  Patient required min assist to access bathroom, cues to utilize grab bar for transfer from sit  to stand while therapist provided min guard to min assist.  Difficulty with negotiating clothing over hips and required occasional min assist.  Assist to navigate wheelchair to the sink for hand hygiene.    Patient seen for fine motor coordination task with right hand to pick up small toothpicks with the use of a tool, large tweezers with cues for prehension pattern to hold and place into small holed container with accuracy.    Patient seen for finger strengthening task with right hand to place and remove small 1/2 inch sized pins into bulletin board.  Cues for prehension patterns and patient had difficulty with pushing pins all the way in the board which has moderate resistance.    Response to tx:  Patient slow with attempts to navigate wheelchair into the bathroom for toileting and continues to require min assist to complete toileting skills (see above for details).  He requires the use of grab bars for the transfer as well as for balance when attempting clothing negotiation.  He continues to lack strength and coordination in his right hand which affects his ability to perform self care and daily tasks.  Continue to work  towards goals in POC to improve independence in necessary daily tasks.                     OT Education - 11/02/18 1410    Education provided  Yes    Education Details  fine motor coordination skills    Person(s) Educated  Patient    Methods  Explanation;Demonstration    Comprehension  Verbalized understanding;Returned demonstration;Verbal cues required       OT Short Term Goals - 03/03/17 1459      OT SHORT TERM GOAL #1   Title  `        OT Long Term Goals - 10/28/18 1447      OT LONG TERM GOAL #2   Title  Pt. will complete self grooming with minA      OT LONG TERM GOAL #4   Title  Pt. will perform self dressing with minA and A/E as needed.    Baseline  Pt. continues to improve LB dressing in standing for hiking clothing. Pt.  continues to require  increased time and mod A donn socks, and pants over his feet. Independent standing to hike clothing. Pt. requires min A donning a jacket, and Independent doning a shirt.    Time  12    Period  Weeks    Status  On-going    Target Date  11/17/18      OT LONG TERM GOAL #6   Title  Pt. will write his name efficiently with 100% legibility    Baseline  Pt continues to present with limited legibility and efficiency when writing name to sign in when riding the [place]    Time  12    Period  Weeks    Status  On-going    Target Date  11/17/18      OT LONG TERM GOAL #7   Title  Pt will independently and consistently follow HEP to increase UE strength to increase functional independence    Baseline  Pt to continues to work towards independence with exercises    Time  12    Period  Weeks    Status  On-going    Target Date  11/17/18      OT LONG TERM GOAL #8   Title  Pt. will require supervision ironing a shirt.    Baseline  Pt. requires minA, and complete set-up seated using a tabletop iron    Time  12    Period  Weeks    Status  On-going    Target Date  11/17/18      OT LONG TERM GOAL  #9   Baseline  Pt. will independently be able to sew a button onto a shirt.    Time  12    Period  Weeks    Target Date  11/17/18      OT LONG TERM GOAL  #10   TITLE  Pt. will independently be able to throw a ball    Baseline  Pt.is limited    Time  12    Period  Weeks    Status  On-going    Target Date  11/17/18      OT LONG TERM GOAL  #11   TITLE  Pt. will improve UE functional reaching to be able to independently use his RUE to hand his clothes in the closest.    Baseline  Pt. continues to have difficulty.    Time  12    Period  Weeks    Status  On-going    Target Date  11/17/18            Plan - 11/03/18 1451    Clinical Impression Statement  Patient slow with attempts to navigate wheelchair into the bathroom for toileting and continues to require min assist to complete toileting skills  (see above for details).  He requires the use of grab bars for the transfer as well as for balance when attempting clothing negotiation.  He continues to lack strength and coordination in his right hand which affects his ability to perform self care and daily tasks.  Continue to work towards goals in [place] to improve independence in necessary daily tasks.    OT Occupational Profile and History  Problem Focused Assessment - Including review of records relating to presenting problem    Occupational performance deficits (Please refer to evaluation for details):  ADL's;IADL's    Body Structure / Function / Physical Skills  ADL;FMC;ROM;IADL;Pain;Coordination    Rehab Potential  Fair    Clinical Decision Making  Several treatment options, min-mod task modification necessary    OT Frequency  2x / week    OT Duration  12 weeks    OT Treatment/Interventions  Self-care/ADL training;Patient/family education;DME and/or AE instruction;Therapeutic exercise;Moist Heat;Neuromuscular education    Consulted and Agree with Plan of Care  Patient       Patient will benefit from skilled therapeutic intervention in order to improve the following deficits and impairments:   Body Structure / Function / Physical Skills: ADL, FMC, ROM, IADL, Pain, Coordination       Visit Diagnosis: 1. Muscle weakness (generalized)   2. Other lack of coordination   3. Hemiplegia and hemiparesis following cerebral infarction affecting right dominant side [facility])       Problem List Patient Active Problem List   Diagnosis Date Noted  . Acute CVA (cerebrovascular accident) ([place]) 05/02/2016  . Left-sided weakness 05/01/2016  Sameera Betton T [name], OTR/L, CLT   Jordan Pardini 11/03/2018, 3:06 PM  [place] MAIN [facility] SERVICES [address] [place], [place], [address] Phone: [phone]   Fax:  [phone]  Name: Curtis Powell MRN: [id number] Date of Birth: 1966-01-25

## 2018-11-04 ENCOUNTER — Ambulatory Visit: Payer: Medicare HMO

## 2018-11-04 ENCOUNTER — Other Ambulatory Visit: Payer: Self-pay

## 2018-11-04 ENCOUNTER — Ambulatory Visit: Payer: Medicare HMO | Admitting: Occupational Therapy

## 2018-11-04 ENCOUNTER — Encounter: Payer: Self-pay | Admitting: Occupational Therapy

## 2018-11-04 DIAGNOSIS — R2689 Other abnormalities of gait and mobility: Secondary | ICD-10-CM

## 2018-11-04 DIAGNOSIS — M6281 Muscle weakness (generalized): Secondary | ICD-10-CM | POA: Diagnosis not present

## 2018-11-04 DIAGNOSIS — I69351 Hemiplegia and hemiparesis following cerebral infarction affecting right dominant side: Secondary | ICD-10-CM

## 2018-11-04 DIAGNOSIS — R278 Other lack of coordination: Secondary | ICD-10-CM

## 2018-11-04 NOTE — Therapy (Signed)
[place] MAIN [facility] SERVICES [address] [place], [place], [address] Phone: [phone]   Fax:  [phone]  Occupational Therapy Treatment  Patient Details  Name: Curtis Powell MRN: [id number] Date of Birth: Aug 19, 1965 No data recorded  Encounter Date: 11/04/2018  OT End of Session - 11/04/18 1454    Visit Number  112    Number of Visits  113    Date for OT Re-Evaluation  11/17/18    Authorization Type  Progress reporting period starting 09/14/2018    OT Start Time  1435    OT Stop Time  1515    OT Time Calculation (min)  40 min    Equipment Utilized During Treatment  Long handled shoe horn    Activity Tolerance  Patient tolerated treatment well    Behavior During Therapy  WFL for tasks assessed/performed       Past Medical History:  Diagnosis Date  . GERD (gastroesophageal reflux disease)   . Hyperlipidemia   . Hypertension   . Obesity   . Stroke [facility])     Past Surgical History:  Procedure Laterality Date  . BRAIN SURGERY      There were no vitals filed for this visit.  Subjective Assessment - 11/04/18 1446    Subjective   Pt. reports having 5/10 left 3rd digit pain which radiates from the MP distally to the tip of the digit.    Patient is accompanied by:  Family member    Pertinent History  Pt. is a 53 y.o. male who suffered a CVA on 05/01/2016. Pt. was admitted to the hospital. Once discharged, he received Home Health PT and OT services for about a month. Pt. has had multiple CVAs over the past 8 years, and has had multiple falls in the past 6 months. Pt. resides in an apartment. Pt. has caregivers for 80 hours. Pt.'s mother stays with pt. at night, and assists with IADL tasks.      Patient Stated Goals  To be able to throw a ball and dribble a ball, do as much as I can for myself.     Currently in Pain?  Yes    Pain Score  5     Pain Orientation  Left    Pain Onset  1 to 4 weeks ago      OT TREATMENT    Neuro  muscular re-education:  Pt. worked on using his RUE  For grasping, and flipping 2" large pegs on the [employer] placed at a tabletop surface. Pt. worked on shoulder elevation, and elbow extension when reaching for pegs the furthest away on the board. Pt. Presented with increased compensation proximally with leaning to the left through his trunk. Pt. performed [facility] tasks using the Grooved pegboard. Pt. worked on grasping the grooved pegs from a horizontal position, and moving the pegs to a vertical position in the hand to prepare for placing them in the grooved slot. Pt. Worked on translatory skills moving the objects through his hand with leaning to the left through his trunk.      Selfcare:  Pt. performed toileting skills with increased time to complete, and CGA to stand, and hike his shorts.  Pt. Was independent  With set-up for hand hygiene.                        OT Education - 11/04/18 1454    Education provided  Yes    Education  Details  fine motor coordination skills    Person(s) Educated  Patient    Methods  Explanation;Demonstration    Comprehension  Verbalized understanding;Returned demonstration;Verbal cues required       OT Short Term Goals - 03/03/17 1459      OT SHORT TERM GOAL #1   Title  `        OT Long Term Goals - 10/28/18 1447      OT LONG TERM GOAL #2   Title  Pt. will complete self grooming with minA      OT LONG TERM GOAL #4   Title  Pt. will perform self dressing with minA and A/E as needed.    Baseline  Pt. continues to improve LB dressing in standing for hiking clothing. Pt.  continues to require increased time and mod A donn socks, and pants over his feet. Independent standing to hike clothing. Pt. requires min A donning a jacket, and Independent doning a shirt.    Time  12    Period  Weeks    Status  On-going    Target Date  11/17/18      OT LONG TERM GOAL #6   Title  Pt. will write his name efficiently with 100% legibility     Baseline  Pt continues to present with limited legibility and efficiency when writing name to sign in when riding the [place]    Time  12    Period  Weeks    Status  On-going    Target Date  11/17/18      OT LONG TERM GOAL #7   Title  Pt will independently and consistently follow HEP to increase UE strength to increase functional independence    Baseline  Pt to continues to work towards independence with exercises    Time  12    Period  Weeks    Status  On-going    Target Date  11/17/18      OT LONG TERM GOAL #8   Title  Pt. will require supervision ironing a shirt.    Baseline  Pt. requires minA, and complete set-up seated using a tabletop iron    Time  12    Period  Weeks    Status  On-going    Target Date  11/17/18      OT LONG TERM GOAL  #9   Baseline  Pt. will independently be able to sew a button onto a shirt.    Time  12    Period  Weeks    Target Date  11/17/18      OT LONG TERM GOAL  #10   TITLE  Pt. will independently be able to throw a ball    Baseline  Pt.is limited    Time  12    Period  Weeks    Status  On-going    Target Date  11/17/18      OT LONG TERM GOAL  #11   TITLE  Pt. will improve UE functional reaching to be able to independently use his RUE to hand his clothes in the closest.    Baseline  Pt. continues to have difficulty.    Time  12    Period  Weeks    Status  On-going    Target Date  11/17/18            Plan - 11/04/18 1458    Clinical Impression Statement Pt. has an appointment with Dr. Mack Guise next Tuesday to assess  his left 3rd digit. Pt. presents with 5/10 pain in his finger, which is decreased form 9/10 pain reported last week. Pt. reports that he is using his bilateral UEs more at home to propel his w/c on the carpet. Pt. is now able to assist more with washing his upper body. Pt continues to work on improving right hand [facility] skills, and functional reaching to improve engagement in ADL tasks, maximize independence, and decrease need  for caregiver assist.    Occupational performance deficits (Please refer to evaluation for details):  ADL's;IADL's    Body Structure / Function / Physical Skills  ADL;FMC;ROM;IADL;Pain;Coordination    Rehab Potential  Fair    Clinical Decision Making  Several treatment options, min-mod task modification necessary    OT Frequency  2x / week    OT Duration  12 weeks    OT Treatment/Interventions  Self-care/ADL training;Patient/family education;DME and/or AE instruction;Therapeutic exercise;Moist Heat;Neuromuscular education    Consulted and Agree with Plan of Care  Patient       Patient will benefit from skilled therapeutic intervention in order to improve the following deficits and impairments:   Body Structure / Function / Physical Skills: ADL, FMC, ROM, IADL, Pain, Coordination       Visit Diagnosis: 1. Muscle weakness (generalized)   2. Other lack of coordination       Problem List Patient Active Problem List   Diagnosis Date Noted  . Acute CVA (cerebrovascular accident) ([place]) 05/02/2016  . Left-sided weakness 05/01/2016    Harrel Carina, MS, OTR/L 11/04/2018, 3:13 PM  [place] MAIN [facility] SERVICES [address] [place], [place], [address] Phone: [phone]   Fax:  [phone]  Name: Curtis Powell MRN: [id number] Date of Birth: 12/01/1965

## 2018-11-04 NOTE — Therapy (Signed)
[place] MAIN [facility] SERVICES [address] [place], [place], [address] Phone: [phone]   Fax:  [phone]  Physical Therapy Treatment  Patient Details  Name: Curtis Powell MRN: [id number] Date of Birth: 1965-06-28 No data recorded  Encounter Date: 11/04/2018  PT End of Session - 11/04/18 1424    Visit Number  126    Number of Visits  139    Date for PT Re-Evaluation  12/21/18    Authorization Type  6/10 with PN on 10/07/18    Authorization Time Period  authorized 05/12/18-11/10/18    PT Start Time  1345    PT Stop Time  1429    PT Time Calculation (min)  44 min    Equipment Utilized During Treatment  Gait belt    Activity Tolerance  Patient tolerated treatment well    Behavior During Therapy  WFL for tasks assessed/performed       Past Medical History:  Diagnosis Date  . GERD (gastroesophageal reflux disease)   . Hyperlipidemia   . Hypertension   . Obesity   . Stroke [facility])     Past Surgical History:  Procedure Laterality Date  . BRAIN SURGERY      There were no vitals filed for this visit.  Subjective Assessment - 11/04/18 1416    Subjective  Patient reports he is doing well. Has had no falls since last session. Has new weights at home (5lb ankle weights).    Pertinent History   Patient is a pleasant 53 year old male who presents to physical therapy for weakness and immobility secondary to CVA.  Had a stroke in Feb 2018. He was previously fully independent, but this stroke caused severe residual deficits, mainly on the right side as well as speech, and now he is unable to walk or perform most of his ADLs on his own. Entire right side is very weak. He still has a little difficulty with speech but his swallowing is improved to baseline. His mom had to move in with him and now is his main caregiver.     Limitations  Sitting;Lifting;Standing;Walking;House hold activities    How long can you sit comfortably?  5 minutes    How long can  you stand comfortably?  5 minutes    How long can you walk comfortably?  10 ft    Patient Stated Goals  Walk without walker, walk further with walker, get some strength in back     Currently in Pain?  No/denies       Ther-ex  Sit to stand 10x with max cueing for sequencing/gluteal squeeze, Min A to reduce "plopping" of poor eccentric control; 10 second holds    Ambulate with RW and CGA scanning room to pick up cones in peripheral vision requiring horizontal and vertical head turns (#6 cones) Frequent cues for upright posture and to increase hip flexion to improve toe clearance during swing. Ambulate 86  ft with tactile cues to PSIS and ASIS for anterior weight shift/step length.    Step negotiation in // bars: lead with left ascending, lead with right desceinding BUE support; cueing for hand placement, sequencing of LE', and upright posture.   -2" step; 6x   4" step, hyperextension of R knee with weightbearing. x4 trials,   Side step in // bars, R knee hyperextension requires tactile assistance for improved weightbearing 2x length of // bars cueing for upright posture.   Standing heel tap; terminated due to R knee hyperextension  Seated:  seated upright posture marches for core activation with arms crossed against chest x 15 each LE  2" step toe taps seated for coordination; sequencing, muscle activation and positional awareness 30 seconds x 2 trials   Adduction ball squeeze 15x 5 second holds  Df/pf rocker on pro stretch 2 minutes; cueing for placement of knees in alignment with ankles       Pt educated throughout session about proper posture and technique with exercises. Improved exercise technique, movement at target joints, use of target muscles after min to mod verbal, visual, tactile cues.                          PT Education - 11/04/18 1417    Education provided  Yes    Education Details  Exercise technique, decreased hyperextension of RLE     Person(s) Educated  Patient    Methods  Explanation;Demonstration;Tactile cues;Verbal cues    Comprehension  Verbalized understanding;Returned demonstration;Verbal cues required;Tactile cues required       PT Short Term Goals - 10/26/18 1719      PT SHORT TERM GOAL #1   Title  Patient will be independent in home exercise program to improve strength/mobility for better functional independence with ADLs.    Baseline  hep compliant    Time  2    Period  Weeks    Status  Achieved      PT SHORT TERM GOAL #2   Title  Patient will require min cueing for STS transfer with CGA for increased independence with mobility.     Baseline  CGA    Time  2    Period  Weeks    Status  Achieved      PT SHORT TERM GOAL #3   Title  Patient will maintain upright posture for > 15 seconds to demonstrate strengthened postural control muscualture     Baseline  18 seconds     Time  2    Period  Weeks    Status  Achieved        PT Long Term Goals - 10/26/18 0001      PT LONG TERM GOAL #1   Title  Patient will perform 10 MWT in < 1 minute to demonstrate improved ambulatory velocity and capacity.     Baseline  4/10: 2 minutes 45 seconds; 5/29: 2 minute 31 seconds; 3 minutes 22 seconds 8/22: 3 minutes 2 seconds  9/12:  2 minutes and 43 seconds 10/15: 57mnute 12 seconds 11/7: 1 minute 21 seconds 12/19: 1 min 16 seconds 3/3: 57 seconds; 08/26/18: 1:41.4s= 0.10 m/s 7/1: 1 minute 14 seconds    Time  8    Period  Weeks    Status  Partially Met    Target Date  12/21/18      PT LONG TERM GOAL #2   Title  Patient (< 679years old) will complete five times sit to stand test in < 10 seconds indicating an increased LE strength and improved balance.    Baseline  5/19: [facility] walker and one on WC, 80% upright posture 7/1: 22 seconds one hand on walker one hand on w/c     Time  8    Period  Weeks    Status  Partially Met    Target Date  12/21/18      PT LONG TERM GOAL #3   Title  Patient will  increase BMerrilee Jansky  Balance score by > 6 points (26/56) to demonstrate decreased fall risk during functional activities.    Baseline  9/12: 20/56 10/15: 21/56 11/7: 20/56 12/19: 22/56; 08/26/18: 11/56 7/1: 17/56     Time  8    Period  Weeks    Status  Partially Met    Target Date  12/21/18      PT LONG TERM GOAL #4   Title  Patient will increase BLE gross strength to 4+/5 as to improve functional strength for independent gait, increased standing tolerance and increased ADL ability.    Baseline   4-/5 RLE, 10/15: 4/5 RLE 11/7: 4/5 RLE 3/3: 4/5 7/1:  hip flexion 4/5 hip extension 2/5 hip abduction/adduciton 2/5     Time  8    Period  Weeks    Status  On-going    Target Date  12/21/18      PT LONG TERM GOAL #5   Title  Patient will increase lower extremity functional scale to >40/80 to demonstrate improved functional mobility and increased tolerance with ADLs.     Baseline  14/80; 12/26: 21/80; 2/18: 28/80  4/10; 25/80 5/29: 27/80; 7/16: 29/80 8/22: 30/80  9/12: 26/80 (feeling more tired due to moving) 10/15: 37/80 11/7: 23/80 3/3: 32/80; 08/25/18: 28/80    Time  8    Period  Weeks    Status  Partially Met    Target Date  12/21/18      PT LONG TERM GOAL  #13   TITLE  Patient will ambulate 150 ft with least assistive device and Supervision with no breaks to allow for increased mobility within home.     Baseline  11/7: 180f 12/19: 115 ft  3/3: will assess next session, has done 200 ft with one rest break; 08/26/18: Pt fatigued after 30' today    Time  8    Period  Weeks    Status  Partially Met    Target Date  12/21/18      PT LONG TERM GOAL  #14   TITLE  Patient will decrease 10 meter walk test to <30 seconds as to improve gait speed for better community ambulation and to reduce fall risk.    Baseline  3/3: 57 seconds with RW and CGA; 08/26/18: 1:41    Time  8    Period  Weeks    Status  On-going    Target Date  12/21/18            Plan - 11/04/18 1428    Clinical Impression  Statement  Patient progressing to not needing wheelchair follow for ambulation with visual scans. Right LE hyperextends with fatigue when performing step negotiation and side stepping. Patient steps with toe first mechanics rather than heel first that progressively worsens with fatigue. Upon attempt at increasing df patient experiences increased knee hyperextension of weightbearing limb.Patient would benefit from additional skilled PT Intervention to improve strength, balance and gait safety.    Rehab Potential  Fair    Clinical Impairments Affecting Rehab Potential  hx of HTN, HLD, CVA, learning disability, Diabetes, brain tumor,     PT Frequency  2x / week    PT Duration  8 weeks    PT Treatment/Interventions  ADLs/Self Care Home Management;Aquatic Therapy;Ultrasound;Moist Heat;Traction;DME Instruction;Gait training;Stair training;Functional mobility training;Therapeutic activities;Therapeutic exercise;Orthotic Fit/Training;Neuromuscular re-education;Balance training;Patient/family education;Manual techniques;Wheelchair mobility training;Passive range of motion;Energy conservation;Taping;Visual/perceptual remediation/compensation    PT Next Visit Plan  standing stability with reach to be able to pull up pants independently  PT Home Exercise Plan  TrA contraction, glute sets: needs updates. Potentially some time in supine flat in hospital bed, to improve hip extension deficits.     Consulted and Agree with Plan of Care  Patient       Patient will benefit from skilled therapeutic intervention in order to improve the following deficits and impairments:  Abnormal gait, Decreased activity tolerance, Decreased balance, Decreased knowledge of precautions, Decreased endurance, Decreased coordination, Decreased knowledge of use of DME, Decreased mobility, Decreased range of motion, Difficulty walking, Decreased safety awareness, Decreased strength, Impaired flexibility, Impaired perceived functional ability,  Impaired tone, Postural dysfunction, Improper body mechanics, Pain  Visit Diagnosis: 1. Muscle weakness (generalized)   2. Other lack of coordination   3. Hemiplegia and hemiparesis following cerebral infarction affecting right dominant side (HCC)   4. Other abnormalities of gait and mobility        Problem List Patient Active Problem List   Diagnosis Date Noted  . Acute CVA (cerebrovascular accident) ([place]) 05/02/2016  . Left-sided weakness 05/01/2016   Janna Arch, PT, DPT   11/04/2018, 2:30 PM  [place] MAIN [facility] SERVICES [address] [place], [place], [address] Phone: [phone]   Fax:  [phone]  Name: GERMAIN KOOPMANN MRN: [id number] Date of Birth: 08/28/1965

## 2018-11-09 ENCOUNTER — Encounter: Payer: Medicare HMO | Admitting: Occupational Therapy

## 2018-11-10 DIAGNOSIS — Z6841 Body Mass Index (BMI) 40.0 and over, adult: Secondary | ICD-10-CM | POA: Insufficient documentation

## 2018-11-11 ENCOUNTER — Ambulatory Visit: Payer: Medicare HMO | Admitting: Physical Therapy

## 2018-11-16 ENCOUNTER — Ambulatory Visit: Payer: Medicare HMO

## 2018-11-16 ENCOUNTER — Encounter: Payer: Self-pay | Admitting: Occupational Therapy

## 2018-11-16 ENCOUNTER — Other Ambulatory Visit: Payer: Self-pay

## 2018-11-16 ENCOUNTER — Ambulatory Visit: Payer: Medicare HMO | Attending: Family Medicine | Admitting: Occupational Therapy

## 2018-11-16 DIAGNOSIS — I69351 Hemiplegia and hemiparesis following cerebral infarction affecting right dominant side: Secondary | ICD-10-CM | POA: Diagnosis present

## 2018-11-16 DIAGNOSIS — M6281 Muscle weakness (generalized): Secondary | ICD-10-CM | POA: Insufficient documentation

## 2018-11-16 DIAGNOSIS — R278 Other lack of coordination: Secondary | ICD-10-CM | POA: Insufficient documentation

## 2018-11-16 DIAGNOSIS — R2689 Other abnormalities of gait and mobility: Secondary | ICD-10-CM | POA: Diagnosis present

## 2018-11-16 NOTE — Therapy (Signed)
[place] MAIN [facility] SERVICES [address] [place], [place], [address] Phone: [phone]   Fax:  [phone]  Occupational Therapy Treatment  Patient Details  Name: Curtis Powell MRN: [id number] Date of Birth: 1965/07/07 No data recorded  Encounter Date: 11/16/2018  OT End of Session - 11/16/18 1355    Visit Number  113    Number of Visits  137    OT Start Time  7544    OT Stop Time  1430    OT Time Calculation (min)  45 min    Equipment Utilized During Treatment  Long handled shoe horn    Activity Tolerance  Patient tolerated treatment well    Behavior During Therapy  WFL for tasks assessed/performed       Past Medical History:  Diagnosis Date  . GERD (gastroesophageal reflux disease)   . Hyperlipidemia   . Hypertension   . Obesity   . Stroke [facility])     Past Surgical History:  Procedure Laterality Date  . BRAIN SURGERY      There were no vitals filed for this visit.  Subjective Assessment - 11/16/18 1352    Subjective   Pt. reports that he still is having pain in his left 3rd digit.    Patient is accompanied by:  Family member    Pertinent History  Pt. is a 53 y.o. male who suffered a CVA on 05/01/2016. Pt. was admitted to the hospital. Once discharged, he received Home Health PT and OT services for about a month. Pt. has had multiple CVAs over the past 8 years, and has had multiple falls in the past 6 months. Pt. resides in an apartment. Pt. has caregivers for 80 hours. Pt.'s mother stays with pt. at night, and assists with IADL tasks.      Currently in Pain?  Yes    Pain Score  6     Pain Location  Finger (Comment which one)    Pain Orientation  Left    Pain Descriptors / Indicators  Throbbing    Pain Type  Acute pain      OT TREATMENT    Neuro muscular re-education:  Pt. worked on grasping 1" resistive cubes alternating thumb opposition to the tip of the 2nd through 5th digits while the board is placed at a vertical  angle. Pt. worked on pressing the cubes back into place while alternating isolated 2nd through 5th digit extension. Pt. presented with increased leaning to the left with his trunk during the task, and required increased cues to right himself back to midline. Pt. performed [facility] tasks using the Grooved pegboard. Pt. worked on grasping the grooved pegs from a horizontal position, and moving the pegs to a vertical position in the hand to prepare for placing them in the grooved slot.   Therapeutic Exercise:  Pt. worked on RUE reaching using the [employer]. Pt. grasped and moved the shapes through 3 vertical dowels of varying heights. Pt. presented with less leaning with the trunk while reaching.                        OT Education - 11/16/18 1354    Education provided  Yes    Education Details  Reaching, and fine motor coordination skills    Person(s) Educated  Patient    Methods  Explanation;Demonstration    Comprehension  Verbalized understanding;Returned demonstration;Verbal cues required    [name]) Educated  Patient  Methods  Explanation;Demonstration;Tactile cues;Verbal cues    Comprehension  Verbalized understanding;Returned demonstration;Verbal cues required;Tactile cues required       OT Short Term Goals - 03/03/17 1459      OT SHORT TERM GOAL #1   Title  `        OT Long Term Goals - 10/28/18 1447      OT LONG TERM GOAL #2   Title  Pt. will complete self grooming with minA      OT LONG TERM GOAL #4   Title  Pt. will perform self dressing with minA and A/E as needed.    Baseline  Pt. continues to improve LB dressing in standing for hiking clothing. Pt.  continues to require increased time and mod A donn socks, and pants over his feet. Independent standing to hike clothing. Pt. requires min A donning a jacket, and Independent doning a shirt.    Time  12    Period  Weeks    Status  On-going    Target Date  11/17/18      OT LONG TERM GOAL #6   Title   Pt. will write his name efficiently with 100% legibility    Baseline  Pt continues to present with limited legibility and efficiency when writing name to sign in when riding the Liberal    Time  12    Period  Weeks    Status  On-going    Target Date  11/17/18      OT LONG TERM GOAL #7   Title  Pt will independently and consistently follow HEP to increase UE strength to increase functional independence    Baseline  Pt to continues to work towards independence with exercises    Time  12    Period  Weeks    Status  On-going    Target Date  11/17/18      OT LONG TERM GOAL #8   Title  Pt. will require supervision ironing a shirt.    Baseline  Pt. requires minA, and complete set-up seated using a tabletop iron    Time  12    Period  Weeks    Status  On-going    Target Date  11/17/18      OT LONG TERM GOAL  #9   Baseline  Pt. will independently be able to sew a button onto a shirt.    Time  12    Period  Weeks    Target Date  11/17/18      OT LONG TERM GOAL  #10   TITLE  Pt. will independently be able to throw a ball    Baseline  Pt.is limited    Time  12    Period  Weeks    Status  On-going    Target Date  11/17/18      OT LONG TERM GOAL  #11   TITLE  Pt. will improve UE functional reaching to be able to independently use his RUE to hand his clothes in the closest.    Baseline  Pt. continues to have difficulty.    Time  12    Period  Weeks    Status  On-going    Target Date  11/17/18            Plan - 11/16/18 1357    Clinical Impression Statement  Pt. continues to have pain in the left 3rd digit. Pt. reports that he plans to make a return appointment with Dr.  Krazinski. Pt. continues to work on improving RUE functional reaching, motor control, and [facility] skills in order to improve ADL, and IADL functioning.    OT Occupational Profile and History  Problem Focused Assessment - Including review of records relating to presenting problem    Occupational performance deficits  (Please refer to evaluation for details):  ADL's;IADL's    Body Structure / Function / Physical Skills  ADL;FMC;ROM;IADL;Pain;Coordination    Rehab Potential  Fair    Clinical Decision Making  Several treatment options, min-mod task modification necessary    OT Frequency  2x / week    OT Duration  12 weeks    OT Treatment/Interventions  Self-care/ADL training;Patient/family education;DME and/or AE instruction;Therapeutic exercise;Moist Heat;Neuromuscular education    Consulted and Agree with Plan of Care  Patient       Patient will benefit from skilled therapeutic intervention in order to improve the following deficits and impairments:   Body Structure / Function / Physical Skills: ADL, FMC, ROM, IADL, Pain, Coordination       Visit Diagnosis: 1. Muscle weakness (generalized)   2. Other lack of coordination       Problem List Patient Active Problem List   Diagnosis Date Noted  . Acute CVA (cerebrovascular accident) ([place]) 05/02/2016  . Left-sided weakness 05/01/2016    Harrel Carina, MS, OTR/L 11/16/2018, 2:15 PM  [place] MAIN [facility] SERVICES [address] [place], [place], [address] Phone: [phone]   Fax:  [phone]  Name: Curtis Powell MRN: [id number] Date of Birth: April 22, 1965

## 2018-11-16 NOTE — Therapy (Signed)
[place] MAIN [facility] SERVICES [address] [place], [place], [address] Phone: [phone]   Fax:  [phone]  Physical Therapy Treatment  Patient Details  Name: Curtis Powell MRN: [id number] Date of Birth: 1965-07-26 No data recorded  Encounter Date: 11/16/2018  PT End of Session - 11/16/18 1513    Visit Number  127    Number of Visits  139    Date for PT Re-Evaluation  12/21/18    Authorization Type  6/10 with PN on 10/07/18    Authorization Time Period  authorized 05/12/18-11/10/18    PT Start Time  1430    PT Stop Time  1515    PT Time Calculation (min)  45 min    Equipment Utilized During Treatment  Gait belt    Activity Tolerance  Patient tolerated treatment well    Behavior During Therapy  WFL for tasks assessed/performed       Past Medical History:  Diagnosis Date  . GERD (gastroesophageal reflux disease)   . Hyperlipidemia   . Hypertension   . Obesity   . Stroke [facility])     Past Surgical History:  Procedure Laterality Date  . BRAIN SURGERY      There were no vitals filed for this visit.  Subjective Assessment - 11/16/18 1512    Subjective  Pt reports doing well today with no questions or concerns. He has no reported falls. He states doing well with exercises at home with new 5# ankle weights becoming fatigued during the last couple of repeitions.    Pertinent History   Patient is a pleasant 53 year old male who presents to physical therapy for weakness and immobility secondary to CVA.  Had a stroke in Feb 2018. He was previously fully independent, but this stroke caused severe residual deficits, mainly on the right side as well as speech, and now he is unable to walk or perform most of his ADLs on his own. Entire right side is very weak. He still has a little difficulty with speech but his swallowing is improved to baseline. His mom had to move in with him and now is his main caregiver.     Limitations   Sitting;Lifting;Standing;Walking;House hold activities    How long can you sit comfortably?  5 minutes    How long can you stand comfortably?  5 minutes    How long can you walk comfortably?  10 ft    Patient Stated Goals  Walk without walker, walk further with walker, get some strength in back     Currently in Pain?  No/denies         TREATMENT  Therapeutic Exercise  Seated adduction ball squeeze 5s hold x15  Seated single-leg straight leg abduction/adduction x10 each  Seated marches x15 each LE  Seated 2" toe taps for coordination, sequencing, and positional awareness 2x30s   Seated modified windmills 2x5 each; noticeable fatigue   Bathroom mobility x5 minutes ; Min A for pulling pants  Back up due to fatigue resulting in R knee buckling/hyperextending with weight acceptance.   Gait Training Ambulate with RW and CGA in hallway 2x75'  Frequent cues for upright posture and to increase hip flexion to improve toe clearance during swing, tactile cues to PSIS and ASIS for anterior weight shift/step length; w/c follow ; terminated due to excessive R knee hyperextension with fatigue.   Forward and backward gait in // bars x1 each; stopped d/t R knee hyperextension and buckling demonstrating fatigue  Pt educated throughout session about proper posture and technique with exercises. Improved exercise technique, movement at target joints, use of target muscles after min to mod verbal, visual, tactile cues.    Pt demonstrates motivation throughout physical therapy session today. Pt requires mod-max verbal cueing during gait for upright posture and increased hip flexion for toe clearance. Significant tactile cues are given to PSIS and ASIS for anterior weight shift/pelvic rotation during gait. During gait, his right LE hyperextends and begins to buckle with fatigue. Pt demonstrates difficulty with seated toe taps for speed d/t challenges with coordination. He requires mod verbal cueing  throughout windmill exercise for sequencing of movement.   Pt will benefit from PT services to address deficits in strength, balance, and mobility in order to return to full function at home.     PT Education - 11/16/18 1510    Education provided  Yes    Education Details  exercise technique, verbal cueing for pelvic rotation and upright posture during gait    Person(s) Educated  Patient    Methods  Explanation;Demonstration;Tactile cues;Verbal cues    Comprehension  Verbalized understanding;Returned demonstration;Tactile cues required;Verbal cues required       PT Short Term Goals - 10/26/18 1719      PT SHORT TERM GOAL #1   Title  Patient will be independent in home exercise program to improve strength/mobility for better functional independence with ADLs.    Baseline  hep compliant    Time  2    Period  Weeks    Status  Achieved      PT SHORT TERM GOAL #2   Title  Patient will require min cueing for STS transfer with CGA for increased independence with mobility.     Baseline  CGA    Time  2    Period  Weeks    Status  Achieved      PT SHORT TERM GOAL #3   Title  Patient will maintain upright posture for > 15 seconds to demonstrate strengthened postural control muscualture     Baseline  18 seconds     Time  2    Period  Weeks    Status  Achieved        PT Long Term Goals - 10/26/18 0001      PT LONG TERM GOAL #1   Title  Patient will perform 10 MWT in < 1 minute to demonstrate improved ambulatory velocity and capacity.     Baseline  4/10: 2 minutes 45 seconds; 5/29: 2 minute 31 seconds; 3 minutes 22 seconds 8/22: 3 minutes 2 seconds  9/12:  2 minutes and 43 seconds 10/15: 41mnute 12 seconds 11/7: 1 minute 21 seconds 12/19: 1 min 16 seconds 3/3: 57 seconds; 08/26/18: 1:41.4s= 0.10 m/s 7/1: 1 minute 14 seconds    Time  8    Period  Weeks    Status  Partially Met    Target Date  12/21/18      PT LONG TERM GOAL #2   Title  Patient (< 610years old) will complete five  times sit to stand test in < 10 seconds indicating an increased LE strength and improved balance.    Baseline  5/19: [facility] hand [facility] walker and one on WC, 80% upright posture 7/1: 22 seconds one hand on walker one hand on w/c     Time  8    Period  Weeks    Status  Partially Met  Target Date  12/21/18      PT LONG TERM GOAL #3   Title  Patient will increase Berg Balance score by > 6 points (26/56) to demonstrate decreased fall risk during functional activities.    Baseline  9/12: 20/56 10/15: 21/56 11/7: 20/56 12/19: 22/56; 08/26/18: 11/56 7/1: 17/56     Time  8    Period  Weeks    Status  Partially Met    Target Date  12/21/18      PT LONG TERM GOAL #4   Title  Patient will increase BLE gross strength to 4+/5 as to improve functional strength for independent gait, increased standing tolerance and increased ADL ability.    Baseline   4-/5 RLE, 10/15: 4/5 RLE 11/7: 4/5 RLE 3/3: 4/5 7/1:  hip flexion 4/5 hip extension 2/5 hip abduction/adduciton 2/5     Time  8    Period  Weeks    Status  On-going    Target Date  12/21/18      PT LONG TERM GOAL #5   Title  Patient will increase lower extremity functional scale to >40/80 to demonstrate improved functional mobility and increased tolerance with ADLs.     Baseline  14/80; 12/26: 21/80; 2/18: 28/80  4/10; 25/80 5/29: 27/80; 7/16: 29/80 8/22: 30/80  9/12: 26/80 (feeling more tired due to moving) 10/15: 37/80 11/7: 23/80 3/3: 32/80; 08/25/18: 28/80    Time  8    Period  Weeks    Status  Partially Met    Target Date  12/21/18      PT LONG TERM GOAL  #13   TITLE  Patient will ambulate 150 ft with least assistive device and Supervision with no breaks to allow for increased mobility within home.     Baseline  11/7: 171f 12/19: 115 ft  3/3: will assess next session, has done 200 ft with one rest break; 08/26/18: Pt fatigued after 30' today    Time  8    Period  Weeks    Status  Partially Met    Target Date  12/21/18      PT LONG  TERM GOAL  #14   TITLE  Patient will decrease 10 meter walk test to <30 seconds as to improve gait speed for better community ambulation and to reduce fall risk.    Baseline  3/3: 57 seconds with RW and CGA; 08/26/18: 1:41    Time  8    Period  Weeks    Status  On-going    Target Date  12/21/18            Plan - 11/16/18 1513    Clinical Impression Statement  Pt demonstrates motivation throughout physical therapy session today. Pt requires mod-max verbal cueing during gait for upright posture and increased hip flexion for toe clearance. Significant tactile cues are given to PSIS and ASIS for anterior weight shift/pelvic rotation during gait. During gait, his right LE hyperextends and begins to buckle with fatigue. Pt demonstrates difficulty with seated toe taps for speed d/t challenges with coordination. He requires mod verbal cueing throughout windmill exercise for sequencing of movement.    Rehab Potential  Fair    Clinical Impairments Affecting Rehab Potential  hx of HTN, HLD, CVA, learning disability, Diabetes, brain tumor,     PT Frequency  2x / week    PT Duration  8 weeks    PT Treatment/Interventions  ADLs/Self Care Home Management;Aquatic Therapy;Ultrasound;Moist Heat;Traction;DME Instruction;Gait training;Stair training;Functional mobility training;Therapeutic  activities;Therapeutic exercise;Orthotic Fit/Training;Neuromuscular re-education;Balance training;Patient/family education;Manual techniques;Wheelchair mobility training;Passive range of motion;Energy conservation;Taping;Visual/perceptual remediation/compensation    PT Next Visit Plan  standing stability with reach to be able to pull up pants independently    PT Home Exercise Plan  TrA contraction, glute sets: needs updates. Potentially some time in supine flat in hospital bed, to improve hip extension deficits.     Consulted and Agree with Plan of Care  Patient       Patient will benefit from skilled therapeutic  intervention in order to improve the following deficits and impairments:  Abnormal gait, Decreased activity tolerance, Decreased balance, Decreased knowledge of precautions, Decreased endurance, Decreased coordination, Decreased knowledge of use of DME, Decreased mobility, Decreased range of motion, Difficulty walking, Decreased safety awareness, Decreased strength, Impaired flexibility, Impaired perceived functional ability, Impaired tone, Postural dysfunction, Improper body mechanics, Pain  Visit Diagnosis: 1. Muscle weakness (generalized)   2. Other lack of coordination   3. Other abnormalities of gait and mobility        Problem List Patient Active Problem List   Diagnosis Date Noted  . Acute CVA (cerebrovascular accident) ([place]) 05/02/2016  . Left-sided weakness 05/01/2016   Juliann Pulse SPT  This entire session was performed under direct supervision and direction of a licensed therapist/therapist assistant . I have personally read, edited and approve of the note as written.  Janna Arch, PT, DPT   11/16/2018, 5:14 PM  [place] MAIN [facility] SERVICES [address] [place], [place], [address] Phone: [phone]   Fax:  [phone]  Name: Curtis Powell MRN: [id number] Date of Birth: Jan 13, 1966

## 2018-11-18 ENCOUNTER — Ambulatory Visit: Payer: Medicare HMO

## 2018-11-18 ENCOUNTER — Encounter: Payer: Self-pay | Admitting: Occupational Therapy

## 2018-11-18 ENCOUNTER — Ambulatory Visit: Payer: Medicare HMO | Admitting: Occupational Therapy

## 2018-11-18 ENCOUNTER — Other Ambulatory Visit: Payer: Self-pay

## 2018-11-18 DIAGNOSIS — R278 Other lack of coordination: Secondary | ICD-10-CM

## 2018-11-18 DIAGNOSIS — R2689 Other abnormalities of gait and mobility: Secondary | ICD-10-CM

## 2018-11-18 DIAGNOSIS — I69351 Hemiplegia and hemiparesis following cerebral infarction affecting right dominant side: Secondary | ICD-10-CM

## 2018-11-18 DIAGNOSIS — M6281 Muscle weakness (generalized): Secondary | ICD-10-CM

## 2018-11-18 NOTE — Therapy (Signed)
[place] MAIN [facility] SERVICES [address] [place], [place], [address] Phone: [phone]   Fax:  [phone]  Occupational Therapy Treatment/Recertification Note  Patient Details  Name: Curtis Powell MRN: [id number] Date of Birth: 10-22-1965 No data recorded  Encounter Date: 11/18/2018  OT End of Session - 11/18/18 1754    Visit Number  114    Number of Visits  137    Date for OT Re-Evaluation  02/10/19    Authorization Type  Progress reporting period starting 09/14/2018    OT Start Time  1430    OT Stop Time  1515    OT Time Calculation (min)  45 min    Activity Tolerance  Patient tolerated treatment well    Behavior During Therapy  [facility] for tasks assessed/performed       Past Medical History:  Diagnosis Date  . GERD (gastroesophageal reflux disease)   . Hyperlipidemia   . Hypertension   . Obesity   . Stroke [facility])     Past Surgical History:  Procedure Laterality Date  . BRAIN SURGERY      There were no vitals filed for this visit.  Subjective Assessment - 11/18/18 1753    Subjective   Pt. reports that he still is having pain in his left 3rd digit.    Patient is accompanied by:  Family member    Pertinent History  Pt. is a 53 y.o. male who suffered a CVA on 05/01/2016. Pt. was admitted to the hospital. Once discharged, he received Home Health PT and OT services for about a month. Pt. has had multiple CVAs over the past 8 years, and has had multiple falls in the past 6 months. Pt. resides in an apartment. Pt. has caregivers for 80 hours. Pt.'s mother stays with pt. at night, and assists with IADL tasks.      Currently in Pain?  No/denies    Pain Score  6     Pain Location  Finger (Comment which one)    Pain Orientation  Left    Pain Descriptors / Indicators  Throbbing    Pain Type  Acute pain     OT TREATMENT    Therapeutic Exercise:  Pt. performed gross gripping with grip strengthener. Pt. worked on sustaining right grip  while grasping pegs and reaching at various heights. The gripper was placed in the 3rd resistive slot with the white resistive spring. Pt. worked on pinch strengthening in the  Right hand for lateral, and 3pt. pinch using yellow, red, green, and blue resistive clips. Pt. worked on placing the clips at various vertical and horizontal angles. Tactile and verbal cues were required for eliciting the desired movement.  Selfcare:  Pt. Performed toileting tasks with CGA while standing at the toilet. Independent with set-up for hand hygiene.                         OT Education - 11/18/18 1753    Education provided  Yes    Education Details  Reaching, and fine motor coordination skills    Person(s) Educated  Patient    Methods  Explanation;Demonstration    Comprehension  Verbalized understanding;Returned demonstration;Verbal cues required       OT Short Term Goals - 03/03/17 1459      OT SHORT TERM GOAL #1   Title  `        OT Long Term Goals - 11/18/18 1758  OT LONG TERM GOAL #4   Title  Pt. will perform self dressing with minA and A/E as needed.    Baseline  Pt. has improved with hiking pants/shorts in standing. ModA socks, and min-modA donning pants over feet. MinA donning a jacket. Indpendent with a T-shirt    Time  12    Period  Weeks    Status  On-going    Target Date  02/10/19      OT LONG TERM GOAL #6   Title  Pt. will write his name efficiently with 100% legibility    Baseline  Pt continues to present with limited legibility and efficiency when writing name to sign in when riding the [place]    Time  12    Period  Weeks    Status  On-going    Target Date  02/10/19      OT LONG TERM GOAL #7   Title  Pt will independently and consistently follow HEP to increase UE strength to increase functional independence    Baseline  Pt to continues to work towards independence with exercises    Time  12    Period  Weeks    Status  On-going    Target Date   02/10/19      OT LONG TERM GOAL #8   Title  Pt. will require supervision ironing a shirt.    Baseline  Pt. requires CGA-minA, and complete set-up seated using a tabletop iron    Time  12    Period  Weeks    Status  On-going    Target Date  02/10/19      OT LONG TERM GOAL  #9   Baseline  Pt. will independently be able to sew a button onto a shirt.    Time  12    Period  Weeks    Status  On-going    Target Date  02/10/19      OT LONG TERM GOAL  #10   TITLE  Pt. will independently be able to throw a ball    Baseline  Pt. continues to be limited    Time  12    Period  Weeks    Status  On-going    Target Date  02/10/19      OT LONG TERM GOAL  #11   TITLE  Pt. will improve UE functional reaching to be able to independently use his RUE to hand his clothes in the closest.    Baseline  Pt. continues to have difficulty.    Time  12    Period  Weeks    Status  On-going    Target Date  02/10/19            Plan - 11/18/18 1755    Clinical Impression Statement  Pt. continues to present with left 3rd digit pain, and now has an appointment scheduled in one week. Pt.s' 3rd digit pain limits his ability to use it during ADLs, and IADL tasks. Pt. has made progress overall with self-dressing, hoem management, and ironing tasks. Pt. continues to wor on improving functional reaching, daily self-care tasks, and IADL functioning including sewing, and ironing. Goals were reviewed   OT Occupational Profile and History  Problem Focused Assessment - Including review of records relating to presenting problem    Occupational performance deficits (Please refer to evaluation for details):  ADL's;IADL's    Body Structure / Function / Physical Skills  ADL;FMC;ROM;IADL;Pain;Coordination    Rehab Potential  Fair  Clinical Decision Making  Several treatment options, min-mod task modification necessary    OT Frequency  2x / week    OT Duration  12 weeks    OT Treatment/Interventions  Self-care/ADL  training;Patient/family education;DME and/or AE instruction;Therapeutic exercise;Moist Heat;Neuromuscular education    Consulted and Agree with Plan of Care  Patient       Patient will benefit from skilled therapeutic intervention in order to improve the following deficits and impairments:   Body Structure / Function / Physical Skills: ADL, FMC, ROM, IADL, Pain, Coordination       Visit Diagnosis: 1. Muscle weakness (generalized)   2. Other lack of coordination       Problem List Patient Active Problem List   Diagnosis Date Noted  . Acute CVA (cerebrovascular accident) ([place]) 05/02/2016  . Left-sided weakness 05/01/2016    Harrel Carina, MS, OTR/L 11/18/2018, 6:12 PM  [place] MAIN [facility] SERVICES [address] [place], [place], [address] Phone: [phone]   Fax:  [phone]  Name: MALAKY TETRAULT MRN: [id number] Date of Birth: June 11, 1965

## 2018-11-18 NOTE — Therapy (Signed)
[place] MAIN [facility] SERVICES [address] [place], [place], [address] Phone: [phone]   Fax:  [phone]  Physical Therapy Treatment  Patient Details  Name: Curtis Powell MRN: [id number] Date of Birth: May 02, 1965 No data recorded  Encounter Date: 11/18/2018  PT End of Session - 11/18/18 1354    Visit Number  128    Number of Visits  139    Date for PT Re-Evaluation  12/21/18    Authorization Type  8/10 with PN on 10/07/18    Authorization Time Period  authorized 05/12/18-11/10/18    PT Start Time  1345    PT Stop Time  1429    PT Time Calculation (min)  44 min    Equipment Utilized During Treatment  Gait belt    Activity Tolerance  Patient tolerated treatment well    Behavior During Therapy  WFL for tasks assessed/performed       Past Medical History:  Diagnosis Date  . GERD (gastroesophageal reflux disease)   . Hyperlipidemia   . Hypertension   . Obesity   . Stroke [facility])     Past Surgical History:  Procedure Laterality Date  . BRAIN SURGERY      There were no vitals filed for this visit.  Subjective Assessment - 11/18/18 1351    Subjective  Patient reports hes has been practicing his exercises at home. No falls or LOB since last session.    Pertinent History   Patient is a pleasant 53 year old male who presents to physical therapy for weakness and immobility secondary to CVA.  Had a stroke in Feb 2018. He was previously fully independent, but this stroke caused severe residual deficits, mainly on the right side as well as speech, and now he is unable to walk or perform most of his ADLs on his own. Entire right side is very weak. He still has a little difficulty with speech but his swallowing is improved to baseline. His mom had to move in with him and now is his main caregiver.     Limitations  Sitting;Lifting;Standing;Walking;House hold activities    How long can you sit comfortably?  5 minutes    How long can you stand  comfortably?  5 minutes    How long can you walk comfortably?  10 ft    Patient Stated Goals  Walk without walker, walk further with walker, get some strength in back     Currently in Pain?  No/denies        patient is wearing his knee brace today    Treatment: Nustep Lvl 4 RPM> 80 for cardiovascular and musculoskeletal challenge, PT cueing for knee alignment and activation of musculature.     Ther-ex  Sit to stand 10x with max cueing for sequencing/gluteal squeeze, Min A to reduce "plopping" of poor eccentric control; 10 second holds   Side step in // bars, R knee hyperextension requires tactile assistance for improved weightbearing 2x length of // bars cueing for upright posture. Cueing for pelvic alignment and foot position for optimal posture   Standing heel tap with df assist BTB band 10x, very challenging and fatiguing to patient  Standing balloon taps with SUE support inside and limited outside range of support 3 minutes    Seated:  seated upright posture marches for core activation with arms crossed against chest x 15 each LE  Seated half arc abduction/adduction straight leg 15x each LE; more challenging to RLE than LLE  4" step toe taps seated for coordination; sequencing, muscle activation and positional awareness 30 seconds x 2 trials; more challenging for RLE      seated modified windmills 5x each side with max cueing for upright posture between each alternating tap.    Seated upright back away from back of chair, arms cross body adduction/abduction opening up into robber stretch closing into fly position    BTB df assist R ankle through laces of shoe: assisted in df and reduced toe first step negotiation.    Pt educated throughout session about proper posture and technique with exercises. Improved exercise technique, movement at target joints, use of target muscles after min to mod verbal, visual, tactile cues.                      PT Education  - 11/18/18 1354    Education provided  Yes    Education Details  exercise technique, stability, body mechanics    Person(s) Educated  Patient    Methods  Explanation;Demonstration;Tactile cues;Verbal cues    Comprehension  Verbalized understanding;Returned demonstration;Verbal cues required;Tactile cues required       PT Short Term Goals - 10/26/18 1719      PT SHORT TERM GOAL #1   Title  Patient will be independent in home exercise program to improve strength/mobility for better functional independence with ADLs.    Baseline  hep compliant    Time  2    Period  Weeks    Status  Achieved      PT SHORT TERM GOAL #2   Title  Patient will require min cueing for STS transfer with CGA for increased independence with mobility.     Baseline  CGA    Time  2    Period  Weeks    Status  Achieved      PT SHORT TERM GOAL #3   Title  Patient will maintain upright posture for > 15 seconds to demonstrate strengthened postural control muscualture     Baseline  18 seconds     Time  2    Period  Weeks    Status  Achieved        PT Long Term Goals - 10/26/18 0001      PT LONG TERM GOAL #1   Title  Patient will perform 10 MWT in < 1 minute to demonstrate improved ambulatory velocity and capacity.     Baseline  4/10: 2 minutes 45 seconds; 5/29: 2 minute 31 seconds; 3 minutes 22 seconds 8/22: 3 minutes 2 seconds  9/12:  2 minutes and 43 seconds 10/15: 31mnute 12 seconds 11/7: 1 minute 21 seconds 12/19: 1 min 16 seconds 3/3: 57 seconds; 08/26/18: 1:41.4s= 0.10 m/s 7/1: 1 minute 14 seconds    Time  8    Period  Weeks    Status  Partially Met    Target Date  12/21/18      PT LONG TERM GOAL #2   Title  Patient (< 693years old) will complete five times sit to stand test in < 10 seconds indicating an increased LE strength and improved balance.    Baseline  5/19[facility] and one on WC, 80% upright posture 7/1: 22 seconds one hand on walker one hand on w/c     Time  8     Period  Weeks    Status  Partially Met    Target Date  12/21/18  PT LONG TERM GOAL #3   Title  Patient will increase Berg Balance score by > 6 points (26/56) to demonstrate decreased fall risk during functional activities.    Baseline  9/12: 20/56 10/15: 21/56 11/7: 20/56 12/19: 22/56; 08/26/18: 11/56 7/1: 17/56     Time  8    Period  Weeks    Status  Partially Met    Target Date  12/21/18      PT LONG TERM GOAL #4   Title  Patient will increase BLE gross strength to 4+/5 as to improve functional strength for independent gait, increased standing tolerance and increased ADL ability.    Baseline   4-/5 RLE, 10/15: 4/5 RLE 11/7: 4/5 RLE 3/3: 4/5 7/1:  hip flexion 4/5 hip extension 2/5 hip abduction/adduciton 2/5     Time  8    Period  Weeks    Status  On-going    Target Date  12/21/18      PT LONG TERM GOAL #5   Title  Patient will increase lower extremity functional scale to >40/80 to demonstrate improved functional mobility and increased tolerance with ADLs.     Baseline  14/80; 12/26: 21/80; 2/18: 28/80  4/10; 25/80 5/29: 27/80; 7/16: 29/80 8/22: 30/80  9/12: 26/80 (feeling more tired due to moving) 10/15: 37/80 11/7: 23/80 3/3: 32/80; 08/25/18: 28/80    Time  8    Period  Weeks    Status  Partially Met    Target Date  12/21/18      PT LONG TERM GOAL  #13   TITLE  Patient will ambulate 150 ft with least assistive device and Supervision with no breaks to allow for increased mobility within home.     Baseline  11/7: 175f 12/19: 115 ft  3/3: will assess next session, has done 200 ft with one rest break; 08/26/18: Pt fatigued after 30' today    Time  8    Period  Weeks    Status  Partially Met    Target Date  12/21/18      PT LONG TERM GOAL  #14   TITLE  Patient will decrease 10 meter walk test to <30 seconds as to improve gait speed for better community ambulation and to reduce fall risk.    Baseline  3/3: 57 seconds with RW and CGA; 08/26/18: 1:41    Time  8    Period  Weeks     Status  On-going    Target Date  12/21/18            Plan - 11/18/18 1426    Clinical Impression Statement  Patient presents with good motivation. Use of blue theraband for dorsiflexion assist wrap resulted in improve step mechanics with ambulation. Upon fatigue patient demonstrates return to right knee hyperextension with weightbearing resulting in need for rest of session to be performed I n nonweightbearing. Patient would benefit from additional skilled PT Intervention to improve strength, balance and gait safety.    Rehab Potential  Fair    Clinical Impairments Affecting Rehab Potential  hx of HTN, HLD, CVA, learning disability, Diabetes, brain tumor,     PT Frequency  2x / week    PT Duration  8 weeks    PT Treatment/Interventions  ADLs/Self Care Home Management;Aquatic Therapy;Ultrasound;Moist Heat;Traction;DME Instruction;Gait training;Stair training;Functional mobility training;Therapeutic activities;Therapeutic exercise;Orthotic Fit/Training;Neuromuscular re-education;Balance training;Patient/family education;Manual techniques;Wheelchair mobility training;Passive range of motion;Energy conservation;Taping;Visual/perceptual remediation/compensation    PT Next Visit Plan  standing stability with reach to be  able to pull up pants independently    PT Home Exercise Plan  TrA contraction, glute sets: needs updates. Potentially some time in supine flat in hospital bed, to improve hip extension deficits.     Consulted and Agree with Plan of Care  Patient       Patient will benefit from skilled therapeutic intervention in order to improve the following deficits and impairments:  Abnormal gait, Decreased activity tolerance, Decreased balance, Decreased knowledge of precautions, Decreased endurance, Decreased coordination, Decreased knowledge of use of DME, Decreased mobility, Decreased range of motion, Difficulty walking, Decreased safety awareness, Decreased strength, Impaired flexibility,  Impaired perceived functional ability, Impaired tone, Postural dysfunction, Improper body mechanics, Pain  Visit Diagnosis: 1. Muscle weakness (generalized)   2. Other lack of coordination   3. Other abnormalities of gait and mobility   4. Hemiplegia and hemiparesis following cerebral infarction affecting right dominant side [facility])        Problem List Patient Active Problem List   Diagnosis Date Noted  . Acute CVA (cerebrovascular accident) ([place]) 05/02/2016  . Left-sided weakness 05/01/2016   Janna Arch, PT, DPT   11/18/2018, 2:31 PM  [place] MAIN [facility] SERVICES [address] [place], [place], [address] Phone: [phone]   Fax:  [phone]  Name: Curtis Powell MRN: [id number] Date of Birth: July 23, 1965

## 2018-11-23 ENCOUNTER — Other Ambulatory Visit: Payer: Self-pay

## 2018-11-23 ENCOUNTER — Encounter: Payer: Self-pay | Admitting: Occupational Therapy

## 2018-11-23 ENCOUNTER — Ambulatory Visit: Payer: Medicare HMO

## 2018-11-23 ENCOUNTER — Ambulatory Visit: Payer: Medicare HMO | Admitting: Occupational Therapy

## 2018-11-23 DIAGNOSIS — R278 Other lack of coordination: Secondary | ICD-10-CM

## 2018-11-23 DIAGNOSIS — R2689 Other abnormalities of gait and mobility: Secondary | ICD-10-CM

## 2018-11-23 DIAGNOSIS — M6281 Muscle weakness (generalized): Secondary | ICD-10-CM

## 2018-11-23 DIAGNOSIS — I69351 Hemiplegia and hemiparesis following cerebral infarction affecting right dominant side: Secondary | ICD-10-CM

## 2018-11-23 NOTE — Therapy (Signed)
[place] MAIN [facility] SERVICES [address] [place], [place], [address] Phone: [phone]   Fax:  [phone]  Occupational Therapy Treatment  Patient Details  Name: Curtis Powell MRN: [id number] Date of Birth: [birth year] No data recorded  Encounter Date: 11/23/2018  OT End of Session - 11/23/18 1308    Visit Number  115    Number of Visits  137    Date for OT Re-Evaluation  02/10/19    Authorization Type  Progress reporting period starting 09/14/2018    OT Start Time  1305    OT Stop Time  1345    OT Time Calculation (min)  40 min    Equipment Utilized During Treatment  Long handled shoe horn    Activity Tolerance  Patient tolerated treatment well    Behavior During Therapy  WFL for tasks assessed/performed       Past Medical History:  Diagnosis Date  . GERD (gastroesophageal reflux disease)   . Hyperlipidemia   . Hypertension   . Obesity   . Stroke [facility])     Past Surgical History:  Procedure Laterality Date  . BRAIN SURGERY      There were no vitals filed for this visit.  Subjective Assessment - 11/23/18 1343    Subjective   Pt. reports that he has a new hockey game that he would like to be able to play better with his mother.    Patient is accompanied by:  Family member    Pertinent History  Pt. is a 53 y.o. male who suffered a CVA on 05/01/2016. Pt. was admitted to the hospital. Once discharged, he received Home Health PT and OT services for about a month. Pt. has had multiple CVAs over the past 8 years, and has had multiple falls in the past 6 months. Pt. resides in an apartment. Pt. has caregivers for 80 hours. Pt.'s mother stays with pt. at night, and assists with IADL tasks.      Patient Stated Goals  To be able to throw a ball and dribble a ball, do as much as I can for myself.     Currently in Pain?  No/denies      OT TREATMENT    Neuro muscular re-education:  Pt. worked on grasping 1" resistive cubes alternating  thumb opposition to the tip of the 2nd through 5th digits while the board is placed at a vertical angle. Pt. worked on pressing the cubes back into place while alternating isolated 2nd through 5th digit extension. Pt. Worked on placing the cubes onto a horizontal dowel without resistance using his left 2nd digit and thumb.Pt. worked on grasping the 1" cubes, and elevating his right shoulder to reach high in order to place them onto a dowel. Pt. presented with minimal leaning to the left with his trunk.  Pt. worked on grasping 1/8" beads, and placed them on an extra small dowel with his right hand.                        OT Education - 11/23/18 1308    Education provided  Yes    Education Details  Reaching, and fine motor coordination skills    Person(s) Educated  Patient    Methods  Explanation;Demonstration    Comprehension  Verbalized understanding;Returned demonstration;Verbal cues required       OT Short Term Goals - 03/03/17 1459      OT SHORT TERM GOAL #1  Title  `        OT Long Term Goals - 11/18/18 1758      OT LONG TERM GOAL #4   Title  Pt. will perform self dressing with minA and A/E as needed.    Baseline  Pt. has improved with hiking pants/shorts in standing. ModA socks, and min-modA donning pants over feet. MinA donning a jacket. Indpendent with a T-shirt    Time  12    Period  Weeks    Status  On-going    Target Date  02/10/19      OT LONG TERM GOAL #6   Title  Pt. will write his name efficiently with 100% legibility    Baseline  Pt continues to present with limited legibility and efficiency when writing name to sign in when riding the [place]    Time  12    Period  Weeks    Status  On-going    Target Date  02/10/19      OT LONG TERM GOAL #7   Title  Pt will independently and consistently follow HEP to increase UE strength to increase functional independence    Baseline  Pt to continues to work towards independence with exercises    Time  12     Period  Weeks    Status  On-going    Target Date  02/10/19      OT LONG TERM GOAL #8   Title  Pt. will require supervision ironing a shirt.    Baseline  Pt. requires CGA-minA, and complete set-up seated using a tabletop iron    Time  12    Period  Weeks    Status  On-going    Target Date  02/10/19      OT LONG TERM GOAL  #9   Baseline  Pt. will independently be able to sew a button onto a shirt.    Time  12    Period  Weeks    Status  On-going    Target Date  02/10/19      OT LONG TERM GOAL  #10   TITLE  Pt. will independently be able to throw a ball    Baseline  Pt. continues to be limited    Time  12    Period  Weeks    Status  On-going    Target Date  02/10/19      OT LONG TERM GOAL  #11   TITLE  Pt. will improve UE functional reaching to be able to independently use his RUE to hand his clothes in the closest.    Baseline  Pt. continues to have difficulty.    Time  12    Period  Weeks    Status  On-going    Target Date  02/10/19            Plan - 11/23/18 1309    Clinical Impression Statement  Pt. has no pain today, however reports that his left 3rd digit is 9/10 during the nighttime. Pt. is making progress overall. Pt. Does compensate proximally with his trunk when reaching and performing [facility] skills. Pt. continues to work on improving BUE strength, and [facility] skills in order to improve functional reaching, and ADL/ IADL functioning.    OT Occupational Profile and History  Problem Focused Assessment - Including review of records relating to presenting problem    Occupational performance deficits (Please refer to evaluation for details):  ADL's;IADL's    Body Structure /  Function / Physical Skills  ADL;FMC;ROM;IADL;Pain;Coordination    Rehab Potential  Fair    Clinical Decision Making  Several treatment options, min-mod task modification necessary    OT Frequency  2x / week    OT Duration  12 weeks    OT Treatment/Interventions  Self-care/ADL  training;Patient/family education;DME and/or AE instruction;Therapeutic exercise;Moist Heat;Neuromuscular education    Consulted and Agree with Plan of Care  Patient       Patient will benefit from skilled therapeutic intervention in order to improve the following deficits and impairments:   Body Structure / Function / Physical Skills: ADL, FMC, ROM, IADL, Pain, Coordination       Visit Diagnosis: 1. Muscle weakness (generalized)   2. Other lack of coordination       Problem List Patient Active Problem List   Diagnosis Date Noted  . Acute CVA (cerebrovascular accident) ([place]) 05/02/2016  . Left-sided weakness 05/01/2016    Harrel Carina, MS, OTR/L 11/23/2018, 3:11 PM  [place] MAIN [facility] SERVICES [address] [place], [place], [address] Phone: [phone]   Fax:  [phone]  Name: Curtis Powell MRN: [id number] Date of Birth: [birth year]

## 2018-11-23 NOTE — Therapy (Signed)
[place] MAIN [facility] SERVICES [address] [place], [place], [address] Phone: [phone]   Fax:  [phone]  Physical Therapy Treatment  Patient Details  Name: Curtis Powell MRN: [id number] Date of Birth: 21-Oct-1965 No data recorded  Encounter Date: 11/23/2018  PT End of Session - 11/23/18 1419    Visit Number  129    Number of Visits  139    Date for PT Re-Evaluation  12/21/18    Authorization Type  9/10 with PN on 10/07/18    Authorization Time Period  authorized 05/12/18-11/10/18    PT Start Time  1346    PT Stop Time  1430    PT Time Calculation (min)  44 min    Equipment Utilized During Treatment  Gait belt    Activity Tolerance  Patient tolerated treatment well    Behavior During Therapy  WFL for tasks assessed/performed       Past Medical History:  Diagnosis Date  . GERD (gastroesophageal reflux disease)   . Hyperlipidemia   . Hypertension   . Obesity   . Stroke [facility])     Past Surgical History:  Procedure Laterality Date  . BRAIN SURGERY      There were no vitals filed for this visit.  Subjective Assessment - 11/23/18 1417    Subjective  Patient is wearing his knee brace today. Reports he is going Friday to the doctor for his trigger finger of the right middle finger. Has been compliant with HEP with no falls or LOB since last session.    Pertinent History   Patient is a pleasant 53 year old male who presents to physical therapy for weakness and immobility secondary to CVA.  Had a stroke in Feb 2018. He was previously fully independent, but this stroke caused severe residual deficits, mainly on the right side as well as speech, and now he is unable to walk or perform most of his ADLs on his own. Entire right side is very weak. He still has a little difficulty with speech but his swallowing is improved to baseline. His mom had to move in with him and now is his main caregiver.     Limitations   Sitting;Lifting;Standing;Walking;House hold activities    How long can you sit comfortably?  5 minutes    How long can you stand comfortably?  5 minutes    How long can you walk comfortably?  10 ft    Patient Stated Goals  Walk without walker, walk further with walker, get some strength in back     Currently in Pain?  No/denies         Ther-ex  Sit to stand 10x with max cueing for sequencing/gluteal squeeze, Min A to reduce "plopping" of poor eccentric control; 10 second holds   Ambulate with RW and CGA throughout gym in circular pattern weaving between obstacles  2x96 ft Frequent cues for upright posture and to increase hip flexion to improve toe clearance during swing, tactile cues to PSIS and ASIS for anterior weight shift/step length; w/c follow ; terminated due to excessive R knee hyperextension with fatigue.      Standing with RW: -marches 12x each LE, cueing for upright posture and weight shift. x2 trials, second trial with mirror in front of patient.  -static upright posture with mirror in front of patient 60 seconds.     Seated:  seated upright posture marches for core activation with arms crossed against chest x 15 each LE  Seated half arc abduction/adduction straight leg 15x each LE; more challenging to RLE than LLE   4" step toe taps seated for coordination; sequencing, muscle activation and positional awareness 30 seconds x 2 trials; more challenging for RLE    Seated upright back away from back of chair, arms cross body adduction/abduction opening up into robber stretch closing into fly position . 12x ; 2 sets     BTB df assist R ankle through laces of shoe: assisted in df and reduced toe first step negotiation.   Seated heel taps 20x. ; cueing for df, hip flexion, then heel strike then rest, very challenging for RLE.   Bathroom mobility: Min A required for pulling pants/pampers back up due to limited stability with standing reach.    Pt educated throughout session  about proper posture and technique with exercises. Improved exercise technique, movement at target joints, use of target muscles after min to mod verbal, visual, tactile cues                        PT Education - 11/23/18 1418    Education provided  Yes    Education Details  exercise technique, stability    Person(s) Educated  Patient    Methods  Explanation;Demonstration;Tactile cues;Verbal cues    Comprehension  Verbalized understanding;Returned demonstration;Verbal cues required;Tactile cues required       PT Short Term Goals - 10/26/18 1719      PT SHORT TERM GOAL #1   Title  Patient will be independent in home exercise program to improve strength/mobility for better functional independence with ADLs.    Baseline  hep compliant    Time  2    Period  Weeks    Status  Achieved      PT SHORT TERM GOAL #2   Title  Patient will require min cueing for STS transfer with CGA for increased independence with mobility.     Baseline  CGA    Time  2    Period  Weeks    Status  Achieved      PT SHORT TERM GOAL #3   Title  Patient will maintain upright posture for > 15 seconds to demonstrate strengthened postural control muscualture     Baseline  18 seconds     Time  2    Period  Weeks    Status  Achieved        PT Long Term Goals - 10/26/18 0001      PT LONG TERM GOAL #1   Title  Patient will perform 10 MWT in < 1 minute to demonstrate improved ambulatory velocity and capacity.     Baseline  4/10: 2 minutes 45 seconds; 5/29: 2 minute 31 seconds; 3 minutes 22 seconds 8/22: 3 minutes 2 seconds  9/12:  2 minutes and 43 seconds 10/15: 61mnute 12 seconds 11/7: 1 minute 21 seconds 12/19: 1 min 16 seconds 3/3: 57 seconds; 08/26/18: 1:41.4s= 0.10 m/s 7/1: 1 minute 14 seconds    Time  8    Period  Weeks    Status  Partially Met    Target Date  12/21/18      PT LONG TERM GOAL #2   Title  Patient (< 674years old) will complete five times sit to stand test in < 10  seconds indicating an increased LE strength and improved balance.    [facility]  [facility] and one on WC,  80% upright posture 7/1: 22 seconds one hand on walker one hand on w/c     Time  8    Period  Weeks    Status  Partially Met    Target Date  12/21/18      PT LONG TERM GOAL #3   Title  Patient will increase Berg Balance score by > 6 points (26/56) to demonstrate decreased fall risk during functional activities.    Baseline  9/12: 20/56 10/15: 21/56 11/7: 20/56 12/19: 22/56; 08/26/18: 11/56 7/1: 17/56     Time  8    Period  Weeks    Status  Partially Met    Target Date  12/21/18      PT LONG TERM GOAL #4   Title  Patient will increase BLE gross strength to 4+/5 as to improve functional strength for independent gait, increased standing tolerance and increased ADL ability.    Baseline   4-/5 RLE, 10/15: 4/5 RLE 11/7: 4/5 RLE 3/3: 4/5 7/1:  hip flexion 4/5 hip extension 2/5 hip abduction/adduciton 2/5     Time  8    Period  Weeks    Status  On-going    Target Date  12/21/18      PT LONG TERM GOAL #5   Title  Patient will increase lower extremity functional scale to >40/80 to demonstrate improved functional mobility and increased tolerance with ADLs.     Baseline  14/80; 12/26: 21/80; 2/18: 28/80  4/10; 25/80 5/29: 27/80; 7/16: 29/80 8/22: 30/80  9/12: 26/80 (feeling more tired due to moving) 10/15: 37/80 11/7: 23/80 3/3: 32/80; 08/25/18: 28/80    Time  8    Period  Weeks    Status  Partially Met    Target Date  12/21/18      PT LONG TERM GOAL  #13   TITLE  Patient will ambulate 150 ft with least assistive device and Supervision with no breaks to allow for increased mobility within home.     Baseline  11/7: 135f 12/19: 115 ft  3/3: will assess next session, has done 200 ft with one rest break; 08/26/18: Pt fatigued after 30' today    Time  8    Period  Weeks    Status  Partially Met    Target Date  12/21/18      PT LONG TERM GOAL  #14   TITLE  Patient  will decrease 10 meter walk test to <30 seconds as to improve gait speed for better community ambulation and to reduce fall risk.    Baseline  3/3: 57 seconds with RW and CGA; 08/26/18: 1:41    Time  8    Period  Weeks    Status  On-going    Target Date  12/21/18            Plan - 11/23/18 1444    Clinical Impression Statement  Patient challenged performing eccentric portion of sit to stand with primary use of LUE for stabilization and control. Right LE continues to be limited with prolonged standing interventions with occasional buckling from fatigue. LLE is limited in dorsiflexion and patient educated on seated df interventions. Patient would benefit from additional skilled PT Intervention to improve strength, balance and gait safety.    Rehab Potential  Fair    Clinical Impairments Affecting Rehab Potential  hx of HTN, HLD, CVA, learning disability, Diabetes, brain tumor,     PT Frequency  2x / week    PT Duration  8 weeks    PT Treatment/Interventions  ADLs/Self Care Home Management;Aquatic Therapy;Ultrasound;Moist Heat;Traction;DME Instruction;Gait training;Stair training;Functional mobility training;Therapeutic activities;Therapeutic exercise;Orthotic Fit/Training;Neuromuscular re-education;Balance training;Patient/family education;Manual techniques;Wheelchair mobility training;Passive range of motion;Energy conservation;Taping;Visual/perceptual remediation/compensation    PT Next Visit Plan  standing stability with reach to be able to pull up pants independently    PT Home Exercise Plan  TrA contraction, glute sets: needs updates. Potentially some time in supine flat in hospital bed, to improve hip extension deficits.     Consulted and Agree with Plan of Care  Patient       Patient will benefit from skilled therapeutic intervention in order to improve the following deficits and impairments:  Abnormal gait, Decreased activity tolerance, Decreased balance, Decreased knowledge of  precautions, Decreased endurance, Decreased coordination, Decreased knowledge of use of DME, Decreased mobility, Decreased range of motion, Difficulty walking, Decreased safety awareness, Decreased strength, Impaired flexibility, Impaired perceived functional ability, Impaired tone, Postural dysfunction, Improper body mechanics, Pain  Visit Diagnosis: 1. Muscle weakness (generalized)   2. Other lack of coordination   3. Other abnormalities of gait and mobility   4. Hemiplegia and hemiparesis following cerebral infarction affecting right dominant side [facility])        Problem List Patient Active Problem List   Diagnosis Date Noted  . Acute CVA (cerebrovascular accident) ([place]) 05/02/2016  . Left-sided weakness 05/01/2016   Janna Arch, PT, DPT   11/23/2018, 2:45 PM  [place] MAIN [facility] SERVICES [address] [place], [place], [address] Phone: [phone]   Fax:  [phone]  Name: Curtis Powell MRN: [id number] Date of Birth: [birth year]

## 2018-11-25 ENCOUNTER — Other Ambulatory Visit: Payer: Self-pay

## 2018-11-25 ENCOUNTER — Ambulatory Visit: Payer: Medicare HMO

## 2018-11-25 ENCOUNTER — Ambulatory Visit: Payer: Medicare HMO | Admitting: Occupational Therapy

## 2018-11-25 ENCOUNTER — Encounter: Payer: Self-pay | Admitting: Occupational Therapy

## 2018-11-25 DIAGNOSIS — R278 Other lack of coordination: Secondary | ICD-10-CM

## 2018-11-25 DIAGNOSIS — M6281 Muscle weakness (generalized): Secondary | ICD-10-CM

## 2018-11-25 DIAGNOSIS — R2689 Other abnormalities of gait and mobility: Secondary | ICD-10-CM

## 2018-11-25 NOTE — Therapy (Signed)
[place] MAIN [facility] SERVICES [address] [place], [place], [address] Phone: [phone]   Fax:  [phone]  Physical Therapy Treatment Physical Therapy Progress Note   Dates of reporting period  10/07/18 to   11/25/18  Patient Details  Name: Curtis Powell MRN: [id number] Date of Birth: July 24, 1965 No data recorded  Encounter Date: 11/25/2018  PT End of Session - 11/25/18 1404    Visit Number  130    Number of Visits  139    Date for PT Re-Evaluation  12/21/18    Authorization Type  10/10 with PN on 10/07/18; next session 1/10 PN 8/19    Authorization Time Period  authorized 05/12/18-11/10/18    PT Start Time  1346    PT Stop Time  1428    PT Time Calculation (min)  42 min    Equipment Utilized During Treatment  Gait belt    Activity Tolerance  Patient tolerated treatment well    Behavior During Therapy  WFL for tasks assessed/performed       Past Medical History:  Diagnosis Date  . GERD (gastroesophageal reflux disease)   . Hyperlipidemia   . Hypertension   . Obesity   . Stroke [facility])     Past Surgical History:  Procedure Laterality Date  . BRAIN SURGERY      There were no vitals filed for this visit.  Subjective Assessment - 11/25/18 1352    Subjective  Patient fell yesterday when using the restroom, reports he was at home going to sit down from using the restroom and must have slipped and fell onto the floor. His right hip is hurting    Pertinent History   Patient is a pleasant 53 year old male who presents to physical therapy for weakness and immobility secondary to CVA.  Had a stroke in Feb 2018. He was previously fully independent, but this stroke caused severe residual deficits, mainly on the right side as well as speech, and now he is unable to walk or perform most of his ADLs on his own. Entire right side is very weak. He still has a little difficulty with speech but his swallowing is improved to baseline. His mom had to move in  with him and now is his main caregiver.     Limitations  Sitting;Lifting;Standing;Walking;House hold activities    How long can you sit comfortably?  5 minutes    How long can you stand comfortably?  5 minutes    How long can you walk comfortably?  10 ft    Patient Stated Goals  Walk without walker, walk further with walker, get some strength in back     Currently in Pain?  Yes    Pain Score  4     Pain Location  Hip    Pain Orientation  Right    Pain Descriptors / Indicators  Aching;Throbbing    Pain Type  Acute pain    Pain Onset  Yesterday    Pain Frequency  Intermittent    Aggravating Factors   sitting    Pain Relieving Factors  nothing patient has noticed            Treatment: Sit to stands with focus on hand placement for safe transfers, noticeable favoring of R side  Standing with RW upright posture ; cueing for gluteal activation and scapular retraction.   Standing with RW with marching, noticeable favoring of R hip, due to favoring did not include weights, did  with normal weight of LE's. 12x each LE.   Standing with RW, step forward and back x8; increased use of UE's requiring Mod A to maintain RW on ground; discomfort of R hip terminated intervention early.   seated scapular retractions with max cueing for scapular control 12x  Seated arms crossed upright posture: marches for LE and core activation x 20   Seated upright posture with scapular retractions, LAQs alternating pattern for core/postural stability challenge x 20  Seated upright posture reaching inside and outside BOS tapping balloon x 3 minutes,   GTB abduction seated with 3 second holds ; 15x  ice provided to hip for reduction of pain      Patient's condition has the potential to improve in response to therapy. Maximum improvement is yet to be obtained. The anticipated improvement is attainable and reasonable in a generally predictable time.  Patient reports he feels he is walking better and getting  better at picking up his feet              PT Education - 11/25/18 1356    Education provided  Yes    Education Details  exercise technique, stability, safe transfers    Person(s) Educated  Patient    Methods  Explanation;Demonstration;Tactile cues;Verbal cues    Comprehension  Verbalized understanding;Returned demonstration;Verbal cues required;Tactile cues required       PT Short Term Goals - 10/26/18 1719      PT SHORT TERM GOAL #1   Title  Patient will be independent in home exercise program to improve strength/mobility for better functional independence with ADLs.    Baseline  hep compliant    Time  2    Period  Weeks    Status  Achieved      PT SHORT TERM GOAL #2   Title  Patient will require min cueing for STS transfer with CGA for increased independence with mobility.     Baseline  CGA    Time  2    Period  Weeks    Status  Achieved      PT SHORT TERM GOAL #3   Title  Patient will maintain upright posture for > 15 seconds to demonstrate strengthened postural control muscualture     Baseline  18 seconds     Time  2    Period  Weeks    Status  Achieved        PT Long Term Goals - 10/26/18 0001      PT LONG TERM GOAL #1   Title  Patient will perform 10 MWT in < 1 minute to demonstrate improved ambulatory velocity and capacity.     Baseline  4/10: 2 minutes 45 seconds; 5/29: 2 minute 31 seconds; 3 minutes 22 seconds 8/22: 3 minutes 2 seconds  9/12:  2 minutes and 43 seconds 10/15: 64mnute 12 seconds 11/7: 1 minute 21 seconds 12/19: 1 min 16 seconds 3/3: 57 seconds; 08/26/18: 1:41.4s= 0.10 m/s 7/1: 1 minute 14 seconds    Time  8    Period  Weeks    Status  Partially Met    Target Date  12/21/18      PT LONG TERM GOAL #2   Title  Patient (< 621years old) will complete five times sit to stand test in < 10 seconds indicating an increased LE strength and improved balance.    [facility]  [facility] and one on WC, 80% upright posture  7/1: 22  seconds one hand on walker one hand on w/c     Time  8    Period  Weeks    Status  Partially Met    Target Date  12/21/18      PT LONG TERM GOAL #3   Title  Patient will increase Berg Balance score by > 6 points (26/56) to demonstrate decreased fall risk during functional activities.    Baseline  9/12: 20/56 10/15: 21/56 11/7: 20/56 12/19: 22/56; 08/26/18: 11/56 7/1: 17/56     Time  8    Period  Weeks    Status  Partially Met    Target Date  12/21/18      PT LONG TERM GOAL #4   Title  Patient will increase BLE gross strength to 4+/5 as to improve functional strength for independent gait, increased standing tolerance and increased ADL ability.    Baseline   4-/5 RLE, 10/15: 4/5 RLE 11/7: 4/5 RLE 3/3: 4/5 7/1:  hip flexion 4/5 hip extension 2/5 hip abduction/adduciton 2/5     Time  8    Period  Weeks    Status  On-going    Target Date  12/21/18      PT LONG TERM GOAL #5   Title  Patient will increase lower extremity functional scale to >40/80 to demonstrate improved functional mobility and increased tolerance with ADLs.     Baseline  14/80; 12/26: 21/80; 2/18: 28/80  4/10; 25/80 5/29: 27/80; 7/16: 29/80 8/22: 30/80  9/12: 26/80 (feeling more tired due to moving) 10/15: 37/80 11/7: 23/80 3/3: 32/80; 08/25/18: 28/80    Time  8    Period  Weeks    Status  Partially Met    Target Date  12/21/18      PT LONG TERM GOAL  #13   TITLE  Patient will ambulate 150 ft with least assistive device and Supervision with no breaks to allow for increased mobility within home.     Baseline  11/7: 175f 12/19: 115 ft  3/3: will assess next session, has done 200 ft with one rest break; 08/26/18: Pt fatigued after 30' today    Time  8    Period  Weeks    Status  Partially Met    Target Date  12/21/18      PT LONG TERM GOAL  #14   TITLE  Patient will decrease 10 meter walk test to <30 seconds as to improve gait speed for better community ambulation and to reduce fall risk.    Baseline  3/3: 57  seconds with RW and CGA; 08/26/18: 1:41    Time  8    Period  Weeks    Status  On-going    Target Date  12/21/18            Plan - 11/25/18 1407    Clinical Impression Statement  Patient fell yesterday when using the restroom, reports he was at home going to sit down from using the restroom and must have slipped and fell onto the floor. His right hip is hurting. Due to this goals will be deferred to next session. Goals last assessed on 10/26/18, please see this note for progress details.  Patient's condition has the potential to improve in response to therapy. Maximum improvement is yet to be obtained. The anticipated improvement is attainable and reasonable in a generally predictable time.  Patient would benefit from additional skilled PT Intervention to improve strength, balance and gait safety.    Rehab  Potential  Fair    Clinical Impairments Affecting Rehab Potential  hx of HTN, HLD, CVA, learning disability, Diabetes, brain tumor,     PT Frequency  2x / week    PT Duration  8 weeks    PT Treatment/Interventions  ADLs/Self Care Home Management;Aquatic Therapy;Ultrasound;Moist Heat;Traction;DME Instruction;Gait training;Stair training;Functional mobility training;Therapeutic activities;Therapeutic exercise;Orthotic Fit/Training;Neuromuscular re-education;Balance training;Patient/family education;Manual techniques;Wheelchair mobility training;Passive range of motion;Energy conservation;Taping;Visual/perceptual remediation/compensation    PT Next Visit Plan  redo goals    PT Home Exercise Plan  TrA contraction, glute sets: needs updates. Potentially some time in supine flat in hospital bed, to improve hip extension deficits.     Consulted and Agree with Plan of Care  Patient       Patient will benefit from skilled therapeutic intervention in order to improve the following deficits and impairments:  Abnormal gait, Decreased activity tolerance, Decreased balance, Decreased knowledge of  precautions, Decreased endurance, Decreased coordination, Decreased knowledge of use of DME, Decreased mobility, Decreased range of motion, Difficulty walking, Decreased safety awareness, Decreased strength, Impaired flexibility, Impaired perceived functional ability, Impaired tone, Postural dysfunction, Improper body mechanics, Pain  Visit Diagnosis: 1. Muscle weakness (generalized)   2. Other lack of coordination   3. Other abnormalities of gait and mobility        Problem List Patient Active Problem List   Diagnosis Date Noted  . Acute CVA (cerebrovascular accident) ([place]) 05/02/2016  . Left-sided weakness 05/01/2016   Janna Arch, PT, DPT   11/25/2018, 2:31 PM  [place] MAIN [facility] SERVICES [address] [place], [place], [address] Phone: [phone]   Fax:  [phone]  Name: Curtis Powell MRN: [id number] Date of Birth: April 02, 1966

## 2018-11-25 NOTE — Therapy (Signed)
[place] MAIN [facility] SERVICES [address] [place], [place], [address] Phone: [phone]   Fax:  [phone]  Occupational Therapy Treatment  Patient Details  Name: Curtis Powell MRN: [id number] Date of Birth: 19-Jun-1965 No data recorded  Encounter Date: 11/25/2018  OT End of Session - 11/25/18 1307    Visit Number  116    Number of Visits  137    Date for OT Re-Evaluation  02/10/19    Authorization Type  Progress reporting period starting 09/14/2018    OT Start Time  1300    OT Stop Time  1345    OT Time Calculation (min)  45 min    Equipment Utilized During Treatment  Long handled shoe horn    Activity Tolerance  Patient tolerated treatment well    Behavior During Therapy  WFL for tasks assessed/performed       Past Medical History:  Diagnosis Date  . GERD (gastroesophageal reflux disease)   . Hyperlipidemia   . Hypertension   . Obesity   . Stroke [facility])     Past Surgical History:  Procedure Laterality Date  . BRAIN SURGERY      There were no vitals filed for this visit.  Subjective Assessment - 11/25/18 1306    Subjective   Pt. reports that he had a fall yesterday.    Patient is accompanied by:  Family member    Pertinent History  Pt. is a 53 y.o. male who suffered a CVA on 05/01/2016. Pt. was admitted to the hospital. Once discharged, he received Home Health PT and OT services for about a month. Pt. has had multiple CVAs over the past 8 years, and has had multiple falls in the past 6 months. Pt. resides in an apartment. Pt. has caregivers for 80 hours. Pt.'s mother stays with pt. at night, and assists with IADL tasks.      Patient Stated Goals  To be able to throw a ball and dribble a ball, do as much as I can for myself.     Currently in Pain?  No/denies      OT TREATMENT    Neuro muscular re-education:  Pt. Worked on functional reaching with the RUE grasping circular rings, and moving them through 3 horizontal rungs of  progressively increasing heights. Pt. required cues for midline sitting secondary to leaning to the left with his trunk. Pt. worked on using his right hand for grasping, and manipulating 1/2" washers from a magnetic dish using point grasp pattern. Pt. worked on reaching up, stabilizing, and sustaining shoulder elevation while placing the washer over a small precise target on vertical dowels positioned at various angles. Pt. Worked on translatory movements moving the washers through his hand to the tip of his 2nd digit. Pt. performed [facility] tasks using the Grooved pegboard. Pt. worked on grasping the grooved pegs from a horizontal position, and moving the pegs to a vertical position in the hand to prepare for placing them in the grooved slot.                         OT Education - 11/25/18 1307    Education provided  Yes    Education Details  Reaching, and fine motor coordination skills    Person(s) Educated  Patient    Methods  Explanation;Demonstration    Comprehension  Verbalized understanding;Returned demonstration;Verbal cues required       OT Short Term Goals - 03/03/17  [place] #1   Title  `        OT Long Term Goals - 11/18/18 1758      OT LONG TERM GOAL #4   Title  Pt. will perform self dressing with minA and A/E as needed.    Baseline  Pt. has improved with hiking pants/shorts in standing. ModA socks, and min-modA donning pants over feet. MinA donning a jacket. Indpendent with a T-shirt    Time  12    Period  Weeks    Status  On-going    Target Date  02/10/19      OT LONG TERM GOAL #6   Title  Pt. will write his name efficiently with 100% legibility    Baseline  Pt continues to present with limited legibility and efficiency when writing name to sign in when riding the [place]    Time  12    Period  Weeks    Status  On-going    Target Date  02/10/19      OT LONG TERM GOAL #7   Title  Pt will independently and consistently follow HEP to  increase UE strength to increase functional independence    Baseline  Pt to continues to work towards independence with exercises    Time  12    Period  Weeks    Status  On-going    Target Date  02/10/19      OT LONG TERM GOAL #8   Title  Pt. will require supervision ironing a shirt.    Baseline  Pt. requires CGA-minA, and complete set-up seated using a tabletop iron    Time  12    Period  Weeks    Status  On-going    Target Date  02/10/19      OT LONG TERM GOAL  #9   Baseline  Pt. will independently be able to sew a button onto a shirt.    Time  12    Period  Weeks    Status  On-going    Target Date  02/10/19      OT LONG TERM GOAL  #10   TITLE  Pt. will independently be able to throw a ball    Baseline  Pt. continues to be limited    Time  12    Period  Weeks    Status  On-going    Target Date  02/10/19      OT LONG TERM GOAL  #11   TITLE  Pt. will improve UE functional reaching to be able to independently use his RUE to hand his clothes in the closest.    Baseline  Pt. continues to have difficulty.    Time  12    Period  Weeks    Status  On-going    Target Date  02/10/19            Plan - 11/25/18 1307    Clinical Impression Statement  Pt. had a fall at home last night when trying to use the bathroom. Pt. reports that he had to call his mother on his cellphone to come, and help him. pt. reports that he was not hurt. Pt. continues to work on improving RUE strength, and [facility] skills in order to increase engagement in daily tasks, and improve independence with ADLs, and IADL tasks.    OT Occupational Profile and History  Problem Focused Assessment - Including review of records relating to  presenting problem    Occupational performance deficits (Please refer to evaluation for details):  ADL's;IADL's    Body Structure / Function / Physical Skills  ADL;FMC;ROM;IADL;Pain;Coordination    Rehab Potential  Fair    Clinical Decision Making  Several treatment options, min-mod  task modification necessary    OT Frequency  2x / week    OT Duration  12 weeks    OT Treatment/Interventions  Self-care/ADL training;Patient/family education;DME and/or AE instruction;Therapeutic exercise;Moist Heat;Neuromuscular education    Consulted and Agree with Plan of Care  Patient       Patient will benefit from skilled therapeutic intervention in order to improve the following deficits and impairments:   Body Structure / Function / Physical Skills: ADL, FMC, ROM, IADL, Pain, Coordination       Visit Diagnosis: 1. Muscle weakness (generalized)   2. Other lack of coordination       Problem List Patient Active Problem List   Diagnosis Date Noted  . Acute CVA (cerebrovascular accident) ([place]) 05/02/2016  . Left-sided weakness 05/01/2016    Harrel Carina, MS, OTR/L 11/25/2018, 1:37 PM  [place] MAIN [facility] SERVICES [address] [place], [place], [address] Phone: [phone]   Fax:  [phone]  Name: Curtis Powell MRN: [id number] Date of Birth: [birth year]

## 2018-11-30 ENCOUNTER — Ambulatory Visit: Payer: Medicare HMO | Admitting: Occupational Therapy

## 2018-11-30 ENCOUNTER — Ambulatory Visit: Payer: Medicare HMO

## 2018-11-30 ENCOUNTER — Other Ambulatory Visit: Payer: Self-pay

## 2018-11-30 ENCOUNTER — Encounter: Payer: Self-pay | Admitting: Occupational Therapy

## 2018-11-30 DIAGNOSIS — M6281 Muscle weakness (generalized): Secondary | ICD-10-CM

## 2018-11-30 DIAGNOSIS — R2689 Other abnormalities of gait and mobility: Secondary | ICD-10-CM

## 2018-11-30 DIAGNOSIS — R278 Other lack of coordination: Secondary | ICD-10-CM

## 2018-11-30 NOTE — Therapy (Signed)
[place] MAIN [facility] SERVICES [address] [place], [place], [address] Phone: [phone]   Fax:  [phone]  Occupational Therapy Treatment  Patient Details  Name: Curtis Powell MRN: [id number] Date of Birth: [birth year] No data recorded  Encounter Date: 11/30/2018  OT End of Session - 11/30/18 1306    Visit Number  117    Number of Visits  137    Date for OT Re-Evaluation  02/10/19    OT Start Time  1300    OT Stop Time  1345    OT Time Calculation (min)  45 min    Equipment Utilized During Treatment  Long handled shoe horn    Activity Tolerance  Patient tolerated treatment well    Behavior During Therapy  WFL for tasks assessed/performed       Past Medical History:  Diagnosis Date  . GERD (gastroesophageal reflux disease)   . Hyperlipidemia   . Hypertension   . Obesity   . Stroke [facility])     Past Surgical History:  Procedure Laterality Date  . BRAIN SURGERY      There were no vitals filed for this visit.  Subjective Assessment - 11/30/18 1306    Subjective   Pt. reports that he had a fall yesterday.    Patient is accompanied by:  Family member    Pertinent History  Pt. is a 53 y.o. male who suffered a CVA on 05/01/2016. Pt. was admitted to the hospital. Once discharged, he received Home Health PT and OT services for about a month. Pt. has had multiple CVAs over the past 8 years, and has had multiple falls in the past 6 months. Pt. resides in an apartment. Pt. has caregivers for 80 hours. Pt.'s mother stays with pt. at night, and assists with IADL tasks.      Currently in Pain?  Yes    Pain Orientation  Right    Pain Descriptors / Indicators  Aching;Throbbing    Pain Onset  Yesterday      OT TREATMENT    Neuro muscular re-education:  Pt. worked on using his right hand for grasping large flat shapes, and moving them through 3 varying heights of vertical dowels. Pt. Dropped one shape. Pt. worked on grasping 1" resistive  cubes alternating thumb opposition to the tip of the 2nd through 5th digits while the board is placed at a vertical angle. Pt. worked on pressing the cubes back into place while alternating isolated 2nd through 5th digit extension. Pt. worked on grasping, and storing 1/2" marbles using his right hand, storing the marbles in the palm of his hand, and performing movements moving the objects from his palm to the tip of his 2nd digit, and thumb. Pt. moved from circular marbles to flat marbles Pt. dropped multiple marbles during translatory movements. Pt. worked on [employer], and storing them in his right hand. Pt. worked on alternating thumb opposition from the tip of his 2nd through 5th digits. Pt. Presented with compensating,a nd leaning to the left during the task.Pt. worked on these tasks in preparation for being able to use his right hand during daily ADL, and IADL tasks.                          OT Education - 11/30/18 1306    Education provided  Yes    Education Details  Reaching, and fine motor coordination skills    Person(s) Educated  Patient    Methods  Explanation;Demonstration    Comprehension  Verbalized understanding;Returned demonstration;Verbal cues required       OT Short Term Goals - 03/03/17 1459      OT SHORT TERM GOAL #1   Title  `        OT Long Term Goals - 11/18/18 1758      OT LONG TERM GOAL #4   Title  Pt. will perform self dressing with minA and A/E as needed.    Baseline  Pt. has improved with hiking pants/shorts in standing. ModA socks, and min-modA donning pants over feet. MinA donning a jacket. Indpendent with a T-shirt    Time  12    Period  Weeks    Status  On-going    Target Date  02/10/19      OT LONG TERM GOAL #6   Title  Pt. will write his name efficiently with 100% legibility    Baseline  Pt continues to present with limited legibility and efficiency when writing name to sign in when riding the [place]    Time  12     Period  Weeks    Status  On-going    Target Date  02/10/19      OT LONG TERM GOAL #7   Title  Pt will independently and consistently follow HEP to increase UE strength to increase functional independence    Baseline  Pt to continues to work towards independence with exercises    Time  12    Period  Weeks    Status  On-going    Target Date  02/10/19      OT LONG TERM GOAL #8   Title  Pt. will require supervision ironing a shirt.    Baseline  Pt. requires CGA-minA, and complete set-up seated using a tabletop iron    Time  12    Period  Weeks    Status  On-going    Target Date  02/10/19      OT LONG TERM GOAL  #9   Baseline  Pt. will independently be able to sew a button onto a shirt.    Time  12    Period  Weeks    Status  On-going    Target Date  02/10/19      OT LONG TERM GOAL  #10   TITLE  Pt. will independently be able to throw a ball    Baseline  Pt. continues to be limited    Time  12    Period  Weeks    Status  On-going    Target Date  02/10/19      OT LONG TERM GOAL  #11   TITLE  Pt. will improve UE functional reaching to be able to independently use his RUE to hand his clothes in the closest.    Baseline  Pt. continues to have difficulty.    Time  12    Period  Weeks    Status  On-going    Target Date  02/10/19            Plan - 11/30/18 1307    Clinical Impression Statement  Pt. had an orthopedic appointment for his left middle digit last Friday, and had an injection in it. Pt. reports that he ordered a glove to wear on the right hand. Pt. Continues to present with limited BUE strength, and [facility] skills in order to improve engagement in ADLs, and  IADLs.   OT  Occupational Profile and History  Problem Focused Assessment - Including review of records relating to presenting problem    Occupational performance deficits (Please refer to evaluation for details):  ADL's;IADL's    Body Structure / Function / Physical Skills  ADL;FMC;ROM;IADL;Pain;Coordination     Rehab Potential  Fair    Clinical Decision Making  Several treatment options, min-mod task modification necessary    OT Frequency  2x / week    OT Duration  12 weeks    OT Treatment/Interventions  Self-care/ADL training;Patient/family education;DME and/or AE instruction;Therapeutic exercise;Moist Heat;Neuromuscular education    Consulted and Agree with Plan of Care  Patient       Patient will benefit from skilled therapeutic intervention in order to improve the following deficits and impairments:   Body Structure / Function / Physical Skills: ADL, FMC, ROM, IADL, Pain, Coordination       Visit Diagnosis: Muscle weakness (generalized)  Other lack of coordination    Problem List Patient Active Problem List   Diagnosis Date Noted  . Acute CVA (cerebrovascular accident) ([place]) 05/02/2016  . Left-sided weakness 05/01/2016    Harrel Carina, MS, OTR/L 11/30/2018, 1:26 PM  [place] MAIN [facility] SERVICES [address] [place], [place], [address] Phone: [phone]   Fax:  [phone]  Name: Curtis Powell MRN: [id number] Date of Birth: 1965/08/08

## 2018-11-30 NOTE — Therapy (Signed)
[place] MAIN [facility] SERVICES [address] [place], [place], [address] Phone: [phone]   Fax:  [phone]  Physical Therapy Treatment  Patient Details  Name: Curtis Powell MRN: [id number] Date of Birth: Aug 26, 1965 No data recorded  Encounter Date: 11/30/2018  PT End of Session - 11/30/18 1356    Visit Number  131    Number of Visits  139    Date for PT Re-Evaluation  12/21/18    Authorization Type  1/10 PN 8/19    Authorization Time Period  authorized 05/12/18-11/10/18    PT Start Time  1347    PT Stop Time  1429    PT Time Calculation (min)  42 min    Equipment Utilized During Treatment  Gait belt    Activity Tolerance  Patient tolerated treatment well    Behavior During Therapy  [facility] for tasks assessed/performed       Past Medical History:  Diagnosis Date  . GERD (gastroesophageal reflux disease)   . Hyperlipidemia   . Hypertension   . Obesity   . Stroke [facility])     Past Surgical History:  Procedure Laterality Date  . BRAIN SURGERY      There were no vitals filed for this visit.  Subjective Assessment - 11/30/18 1354    Subjective  Patient reports he is going to go to the doctor for his wheezing. Is going to go to the cardiologist. No falls or LOB since last session.    Pertinent History   Patient is a pleasant 53 year old male who presents to physical therapy for weakness and immobility secondary to CVA.  Had a stroke in Feb 2018. He was previously fully independent, but this stroke caused severe residual deficits, mainly on the right side as well as speech, and now he is unable to walk or perform most of his ADLs on his own. Entire right side is very weak. He still has a little difficulty with speech but his swallowing is improved to baseline. His mom had to move in with him and now is his main caregiver.     Limitations  Sitting;Lifting;Standing;Walking;House hold activities    How long can you sit comfortably?  5 minutes     How long can you stand comfortably?  5 minutes    How long can you walk comfortably?  10 ft    Patient Stated Goals  Walk without walker, walk further with walker, get some strength in back     Currently in Pain?  No/denies       Nustep Lvl 5 3 minutes RPM> 60 for cardiovascular challenge     Ther-ex  Sit to stand 10x with max cueing for sequencing/gluteal squeeze, Min A to reduce "plopping" of poor eccentric control; 10 second holds    Ambulation terminated due to fatigue and shortness of breath. Vitals monitored SP02 >98% HR 102    Standing with RW: -marches 12x each LE, cueing for upright posture and weight shift. x2 trials, second trial with mirror in front of patient.  -static upright posture with mirror in front of patient 60 seconds.  Spo02 91% on room air    -hip extension 10x each LE, cueing for upright posture, gluteal activation Sp02 96% on room air  -hip abduction 10x each LE, cueing for upright posture Sp02 95% on room air   Seated:  seated upright posture marches for core activation with arms crossed against chest x 15 each LE   Seated  half arc abduction/adduction straight leg 15x each LE; more challenging to RLE than LLE    Seated upright back away from back of chair, arms cross body adduction/abduction opening up into robber stretch closing into fly position . 12x ; 2 sets    GTB hamstring curl 10x each LE   GTB abduction 15x, cueing for slow velocity for optimal muscle recruitment.   GTB adduction 12x each LE    Seated heel taps 20x. ; cueing for df, hip flexion, then heel strike then rest, very challenging for RLE.    Bathroom mobility: Min A required for pulling pants/pampers back up due to limited stability with standing reach.    Pt educated throughout session about proper posture and technique with exercises. Improved exercise technique, movement at target joints, use of target muscles after min to mod verbal, visual, tactile  cues                     PT Education - 11/30/18 1355    Education provided  Yes    Education Details  exercise technique, body mechanics    Person(s) Educated  Patient    Methods  Explanation;Demonstration;Tactile cues;Verbal cues    Comprehension  Verbalized understanding;Returned demonstration;Verbal cues required;Tactile cues required       PT Short Term Goals - 10/26/18 1719      PT SHORT TERM GOAL #1   Title  Patient will be independent in home exercise program to improve strength/mobility for better functional independence with ADLs.    Baseline  hep compliant    Time  2    Period  Weeks    Status  Achieved      PT SHORT TERM GOAL #2   Title  Patient will require min cueing for STS transfer with CGA for increased independence with mobility.     Baseline  CGA    Time  2    Period  Weeks    Status  Achieved      PT SHORT TERM GOAL #3   Title  Patient will maintain upright posture for > 15 seconds to demonstrate strengthened postural control muscualture     Baseline  18 seconds     Time  2    Period  Weeks    Status  Achieved        PT Long Term Goals - 10/26/18 0001      PT LONG TERM GOAL #1   Title  Patient will perform 10 MWT in < 1 minute to demonstrate improved ambulatory velocity and capacity.     Baseline  4/10: 2 minutes 45 seconds; 5/29: 2 minute 31 seconds; 3 minutes 22 seconds 8/22: 3 minutes 2 seconds  9/12:  2 minutes and 43 seconds 10/15: 70mnute 12 seconds 11/7: 1 minute 21 seconds 12/19: 1 min 16 seconds 3/3: 57 seconds; 08/26/18: 1:41.4s= 0.10 m/s 7/1: 1 minute 14 seconds    Time  8    Period  Weeks    Status  Partially Met    Target Date  12/21/18      PT LONG TERM GOAL #2   Title  Patient (< 638years old) will complete five times sit to stand test in < 10 seconds indicating an increased LE strength and improved balance.    Baseline  5/19[facility] and one on WC, 80% upright posture 7/1: 22 seconds one  hand on walker one hand on w/c  Time  8    Period  Weeks    Status  Partially Met    Target Date  12/21/18      PT LONG TERM GOAL #3   Title  Patient will increase Berg Balance score by > 6 points (26/56) to demonstrate decreased fall risk during functional activities.    Baseline  9/12: 20/56 10/15: 21/56 11/7: 20/56 12/19: 22/56; 08/26/18: 11/56 7/1: 17/56     Time  8    Period  Weeks    Status  Partially Met    Target Date  12/21/18      PT LONG TERM GOAL #4   Title  Patient will increase BLE gross strength to 4+/5 as to improve functional strength for independent gait, increased standing tolerance and increased ADL ability.    Baseline   4-/5 RLE, 10/15: 4/5 RLE 11/7: 4/5 RLE 3/3: 4/5 7/1:  hip flexion 4/5 hip extension 2/5 hip abduction/adduciton 2/5     Time  8    Period  Weeks    Status  On-going    Target Date  12/21/18      PT LONG TERM GOAL #5   Title  Patient will increase lower extremity functional scale to >40/80 to demonstrate improved functional mobility and increased tolerance with ADLs.     Baseline  14/80; 12/26: 21/80; 2/18: 28/80  4/10; 25/80 5/29: 27/80; 7/16: 29/80 8/22: 30/80  9/12: 26/80 (feeling more tired due to moving) 10/15: 37/80 11/7: 23/80 3/3: 32/80; 08/25/18: 28/80    Time  8    Period  Weeks    Status  Partially Met    Target Date  12/21/18      PT LONG TERM GOAL  #13   TITLE  Patient will ambulate 150 ft with least assistive device and Supervision with no breaks to allow for increased mobility within home.     Baseline  11/7: 164f 12/19: 115 ft  3/3: will assess next session, has done 200 ft with one rest break; 08/26/18: Pt fatigued after 30' today    Time  8    Period  Weeks    Status  Partially Met    Target Date  12/21/18      PT LONG TERM GOAL  #14   TITLE  Patient will decrease 10 meter walk test to <30 seconds as to improve gait speed for better community ambulation and to reduce fall risk.    Baseline  3/3: 57 seconds with RW and  CGA; 08/26/18: 1:41    Time  8    Period  Weeks    Status  On-going    Target Date  12/21/18            Plan - 11/30/18 1419    Clinical Impression Statement  Patient presents with more fatigue to today's physical therapy session. Vitals monitored with HR within normal range and Sp02 slightly dropping with marching however remains within functional range. Patient would benefit from additional skilled PT Intervention to improve strength, balance and gait safety.    Rehab Potential  Fair    Clinical Impairments Affecting Rehab Potential  hx of HTN, HLD, CVA, learning disability, Diabetes, brain tumor,     PT Frequency  2x / week    PT Duration  8 weeks    PT Treatment/Interventions  ADLs/Self Care Home Management;Aquatic Therapy;Ultrasound;Moist Heat;Traction;DME Instruction;Gait training;Stair training;Functional mobility training;Therapeutic activities;Therapeutic exercise;Orthotic Fit/Training;Neuromuscular re-education;Balance training;Patient/family education;Manual techniques;Wheelchair mobility training;Passive range of motion;Energy conservation;Taping;Visual/perceptual remediation/compensation  PT Next Visit Plan  redo goals    PT Home Exercise Plan  TrA contraction, glute sets: needs updates. Potentially some time in supine flat in hospital bed, to improve hip extension deficits.     Consulted and Agree with Plan of Care  Patient       Patient will benefit from skilled therapeutic intervention in order to improve the following deficits and impairments:  Abnormal gait, Decreased activity tolerance, Decreased balance, Decreased knowledge of precautions, Decreased endurance, Decreased coordination, Decreased knowledge of use of DME, Decreased mobility, Decreased range of motion, Difficulty walking, Decreased safety awareness, Decreased strength, Impaired flexibility, Impaired perceived functional ability, Impaired tone, Postural dysfunction, Improper body mechanics, Pain  Visit  Diagnosis: Muscle weakness (generalized)  Other lack of coordination  Other abnormalities of gait and mobility     Problem List Patient Active Problem List   Diagnosis Date Noted  . Acute CVA (cerebrovascular accident) ([place]) 05/02/2016  . Left-sided weakness 05/01/2016   Janna Arch, PT, DPT   11/30/2018, 2:29 PM  [place] MAIN [facility] SERVICES [address] [place], [place], [address] Phone: [phone]   Fax:  [phone]  Name: MCCABE GLORIA MRN: [id number] Date of Birth: 1966-01-29

## 2018-12-02 ENCOUNTER — Ambulatory Visit: Payer: Medicare HMO

## 2018-12-02 ENCOUNTER — Ambulatory Visit: Payer: Medicare HMO | Admitting: Occupational Therapy

## 2018-12-02 ENCOUNTER — Other Ambulatory Visit: Payer: Self-pay

## 2018-12-02 ENCOUNTER — Encounter: Payer: Self-pay | Admitting: Occupational Therapy

## 2018-12-02 DIAGNOSIS — R2689 Other abnormalities of gait and mobility: Secondary | ICD-10-CM

## 2018-12-02 DIAGNOSIS — R278 Other lack of coordination: Secondary | ICD-10-CM

## 2018-12-02 DIAGNOSIS — M6281 Muscle weakness (generalized): Secondary | ICD-10-CM

## 2018-12-02 DIAGNOSIS — I69351 Hemiplegia and hemiparesis following cerebral infarction affecting right dominant side: Secondary | ICD-10-CM

## 2018-12-02 NOTE — Therapy (Signed)
[place] MAIN [facility] SERVICES [address] [place], [place], [address] Phone: [phone]   Fax:  [phone]  Occupational Therapy Treatment  Patient Details  Name: Curtis Powell MRN: [id number] Date of Birth: 21-Jan-1966 No data recorded  Encounter Date: 12/02/2018  OT End of Session - 12/02/18 1309    Visit Number  118    Number of Visits  137    Date for OT Re-Evaluation  02/10/19    Authorization Type  Progress reporting period starting 09/14/2018    OT Start Time  1300    OT Stop Time  1345    OT Time Calculation (min)  45 min    Activity Tolerance  Patient tolerated treatment well    Behavior During Therapy  [facility] for tasks assessed/performed       Past Medical History:  Diagnosis Date  . GERD (gastroesophageal reflux disease)   . Hyperlipidemia   . Hypertension   . Obesity   . Stroke [facility])     Past Surgical History:  Procedure Laterality Date  . BRAIN SURGERY      There were no vitals filed for this visit.  Subjective Assessment - 12/02/18 1307    Subjective   Pt. reports that he had a fall yesterday.    Patient is accompanied by:  Family member    Pertinent History  Pt. is a 53 y.o. male who suffered a CVA on 05/01/2016. Pt. was admitted to the hospital. Once discharged, he received Home Health PT and OT services for about a month. Pt. has had multiple CVAs over the past 8 years, and has had multiple falls in the past 6 months. Pt. resides in an apartment. Pt. has caregivers for 80 hours. Pt.'s mother stays with pt. at night, and assists with IADL tasks.      Patient Stated Goals  To be able to throw a ball and dribble a ball, do as much as I can for myself.     Currently in Pain?  Yes    Pain Score  0-No pain    Pain Location  Finger (Comment which one)    Pain Orientation  Left     OT TREATMENT    Neuro muscular re-education:  Pt. worked on grasping washers, reaching up with his right UE, and positioning them onto  magnetic hooks on a whiteboard positioned at an elevated vertical angle on the tabletop. Pt. worked on [facility] skills grasping 1/2" flat washers, and placing them on the hooks. Pt. worked on [facility] skills grasping 1" sticks on the [employer] . Pt. worked on grasping and storing 1" sticks. Pt. worked on removing the pegs using bilateral alternating hand patterns. Pt. With less compensation proximally today, however did present with leaning to the left with his trunk during reaching tasks.                            OT Education - 12/02/18 1308    Education provided  Yes    Education Details  Reaching, and fine motor coordination skills    Person(s) Educated  Patient    Methods  Explanation;Demonstration    Comprehension  Verbalized understanding;Returned demonstration;Verbal cues required       OT Short Term Goals - 03/03/17 1459      OT SHORT TERM GOAL #1   Title  `        OT Long Term Goals - 11/18/18 1758  OT LONG TERM GOAL #4   Title  Pt. will perform self dressing with minA and A/E as needed.    Baseline  Pt. has improved with hiking pants/shorts in standing. ModA socks, and min-modA donning pants over feet. MinA donning a jacket. Indpendent with a T-shirt    Time  12    Period  Weeks    Status  On-going    Target Date  02/10/19      OT LONG TERM GOAL #6   Title  Pt. will write his name efficiently with 100% legibility    Baseline  Pt continues to present with limited legibility and efficiency when writing name to sign in when riding the [place]    Time  12    Period  Weeks    Status  On-going    Target Date  02/10/19      OT LONG TERM GOAL #7   Title  Pt will independently and consistently follow HEP to increase UE strength to increase functional independence    Baseline  Pt to continues to work towards independence with exercises    Time  12    Period  Weeks    Status  On-going    Target Date  02/10/19      OT LONG TERM GOAL #8   Title  Pt.  will require supervision ironing a shirt.    Baseline  Pt. requires CGA-minA, and complete set-up seated using a tabletop iron    Time  12    Period  Weeks    Status  On-going    Target Date  02/10/19      OT LONG TERM GOAL  #9   Baseline  Pt. will independently be able to sew a button onto a shirt.    Time  12    Period  Weeks    Status  On-going    Target Date  02/10/19      OT LONG TERM GOAL  #10   TITLE  Pt. will independently be able to throw a ball    Baseline  Pt. continues to be limited    Time  12    Period  Weeks    Status  On-going    Target Date  02/10/19      OT LONG TERM GOAL  #11   TITLE  Pt. will improve UE functional reaching to be able to independently use his RUE to hand his clothes in the closest.    Baseline  Pt. continues to have difficulty.    Time  12    Period  Weeks    Status  On-going    Target Date  02/10/19            Plan - 12/02/18 1311    Clinical Impression Statement Pt. reports that the pain in his left 3rd digit has improved since having the injection. Pt. reports his finger continues to "get stuck" in flexion. Pt. continues to work on improving LUE functional reachning, LUE strength, [place] skills, and functional reaching during ADLs, and IADLs tasks..    OT Occupational Profile and History  Problem Focused Assessment - Including review of records relating to presenting problem    Occupational performance deficits (Please refer to evaluation for details):  ADL's;IADL's    Body Structure / Function / Physical Skills  ADL;FMC;ROM;IADL;Pain;Coordination    Rehab Potential  Fair    Clinical Decision Making  Several treatment options, min-mod task modification necessary    OT Frequency  2x / week    OT Duration  12 weeks    OT Treatment/Interventions  Self-care/ADL training;Patient/family education;DME and/or AE instruction;Therapeutic exercise;Moist Heat;Neuromuscular education    Consulted and Agree with Plan of Care  Patient        Patient will benefit from skilled therapeutic intervention in order to improve the following deficits and impairments:   Body Structure / Function / Physical Skills: ADL, FMC, ROM, IADL, Pain, Coordination       Visit Diagnosis: Muscle weakness (generalized)  Other lack of coordination    Problem List Patient Active Problem List   Diagnosis Date Noted  . Acute CVA (cerebrovascular accident) ([place]) 05/02/2016  . Left-sided weakness 05/01/2016    Harrel Carina, MS, OTR/L 12/02/2018, 1:26 PM  [place] MAIN [facility] SERVICES [address] [place], [place], [address] Phone: [phone]   Fax:  [phone]  Name: Curtis Powell MRN: [id number] Date of Birth: [birth year]

## 2018-12-02 NOTE — Therapy (Signed)
[place] MAIN [facility] SERVICES [address] [place], [place], [address] Phone: [phone]   Fax:  [phone]  Physical Therapy Treatment  Patient Details  Name: Curtis Powell MRN: [id number] Date of Birth: [birth year] No data recorded  Encounter Date: 12/02/2018  PT End of Session - 12/02/18 1418    Visit Number  132    Number of Visits  139    Date for PT Re-Evaluation  12/21/18    Authorization Type  2/10 PN 8/19    Authorization Time Period  authorized 05/12/18-11/10/18    PT Start Time  1346    PT Stop Time  1430    PT Time Calculation (min)  44 min    Equipment Utilized During Treatment  Gait belt    Activity Tolerance  Patient tolerated treatment well    Behavior During Therapy  [facility] for tasks assessed/performed       Past Medical History:  Diagnosis Date  . GERD (gastroesophageal reflux disease)   . Hyperlipidemia   . Hypertension   . Obesity   . Stroke [facility])     Past Surgical History:  Procedure Laterality Date  . BRAIN SURGERY      There were no vitals filed for this visit.  Subjective Assessment - 12/02/18 1414    Subjective  Patient reports doing well today. Is wearing his brace on his R knee to help with the pain/hyperextension. No falls or LOB since last session.    Pertinent History   Patient is a pleasant 53 year old male who presents to physical therapy for weakness and immobility secondary to CVA.  Had a stroke in Feb 2018. He was previously fully independent, but this stroke caused severe residual deficits, mainly on the right side as well as speech, and now he is unable to walk or perform most of his ADLs on his own. Entire right side is very weak. He still has a little difficulty with speech but his swallowing is improved to baseline. His mom had to move in with him and now is his main caregiver.     Limitations  Sitting;Lifting;Standing;Walking;House hold activities    How long can you sit comfortably?  5 minutes     How long can you stand comfortably?  5 minutes    How long can you walk comfortably?  10 ft    Patient Stated Goals  Walk without walker, walk further with walker, get some strength in back     Currently in Pain?  No/denies         Ther-ex  Sit to stand 10x with max cueing for sequencing/gluteal squeeze, CGA-Min A to reduce "plopping" of poor eccentric control; 10 second holds; tactile assistance and verbal cueing for forward pelvic control for upright posture    Ambulate with RW and CGA  100 ft; Frequent cues for upright posture and to increase hip flexion to improve toe clearance during swing, tactile cues to PSIS and ASIS for anterior weight shift/step length; w/c follow verbal cueing for upright posture    ambulate 10 ft around cone and return to chair with RW, challenging with excessive trunk flexion and fatigue. x2 trials    Standing with RW: -marches 12x each LE, cueing for upright posture and weight shift. x2 trials, second trial with mirror in front of patient.  -static upright posture with mirror in front of patient 60 seconds.      Seated:  BTB abduction with 3 second holds 15x  seated upright posture marches for core activation with arms crossed against chest x 15 each LE   Seated half arc abduction/adduction straight leg 15x each LE; more challenging to RLE than LLE   Seated LAQ with coordination and sequencing of muscle activation with use of soccer ball for increased challenge, more challenged with timing of RLE than LLE with a 95% accuracy LLE and a 70% accuracy RLE.   Seated adduction ball squeezes 3 second holds 15x   Seated alternating df/pf rocking 10x  Bathroom mobility: Min A required for pulling pants/pampers back up due to limited stability with standing reach.    Pt educated throughout session about proper posture and technique with exercises. Improved exercise technique, movement at target joints, use of target muscles after min to mod verbal, visual,  tactile cues                        PT Education - 12/02/18 1415    Education provided  Yes    Education Details  exercise technique, body mechanics,    Person(s) Educated  Patient    Methods  Explanation;Demonstration;Tactile cues;Verbal cues    Comprehension  Verbalized understanding;Returned demonstration;Verbal cues required;Tactile cues required       PT Short Term Goals - 10/26/18 1719      PT SHORT TERM GOAL #1   Title  Patient will be independent in home exercise program to improve strength/mobility for better functional independence with ADLs.    Baseline  hep compliant    Time  2    Period  Weeks    Status  Achieved      PT SHORT TERM GOAL #2   Title  Patient will require min cueing for STS transfer with CGA for increased independence with mobility.     Baseline  CGA    Time  2    Period  Weeks    Status  Achieved      PT SHORT TERM GOAL #3   Title  Patient will maintain upright posture for > 15 seconds to demonstrate strengthened postural control muscualture     Baseline  18 seconds     Time  2    Period  Weeks    Status  Achieved        PT Long Term Goals - 10/26/18 0001      PT LONG TERM GOAL #1   Title  Patient will perform 10 MWT in < 1 minute to demonstrate improved ambulatory velocity and capacity.     Baseline  4/10: 2 minutes 45 seconds; 5/29: 2 minute 31 seconds; 3 minutes 22 seconds 8/22: 3 minutes 2 seconds  9/12:  2 minutes and 43 seconds 10/15: 48mnute 12 seconds 11/7: 1 minute 21 seconds 12/19: 1 min 16 seconds 3/3: 57 seconds; 08/26/18: 1:41.4s= 0.10 m/s 7/1: 1 minute 14 seconds    Time  8    Period  Weeks    Status  Partially Met    Target Date  12/21/18      PT LONG TERM GOAL #2   Title  Patient (< 89years old) will complete five times sit to stand test in < 10 seconds indicating an increased LE strength and improved balance.    Baseline  5/19: [facility] one hand on walker and one on WC, 80% upright posture 7/1:  22 seconds one hand on walker one hand on w/c     Time  8  Period  Weeks    Status  Partially Met    Target Date  12/21/18      PT LONG TERM GOAL #3   Title  Patient will increase Berg Balance score by > 6 points (26/56) to demonstrate decreased fall risk during functional activities.    Baseline  9/12: 20/56 10/15: 21/56 11/7: 20/56 12/19: 22/56; 08/26/18: 11/56 7/1: 17/56     Time  8    Period  Weeks    Status  Partially Met    Target Date  12/21/18      PT LONG TERM GOAL #4   Title  Patient will increase BLE gross strength to 4+/5 as to improve functional strength for independent gait, increased standing tolerance and increased ADL ability.    Baseline   4-/5 RLE, 10/15: 4/5 RLE 11/7: 4/5 RLE 3/3: 4/5 7/1:  hip flexion 4/5 hip extension 2/5 hip abduction/adduciton 2/5     Time  8    Period  Weeks    Status  On-going    Target Date  12/21/18      PT LONG TERM GOAL #5   Title  Patient will increase lower extremity functional scale to >40/80 to demonstrate improved functional mobility and increased tolerance with ADLs.     Baseline  14/80; 12/26: 21/80; 2/18: 28/80  4/10; 25/80 5/29: 27/80; 7/16: 29/80 8/22: 30/80  9/12: 26/80 (feeling more tired due to moving) 10/15: 37/80 11/7: 23/80 3/3: 32/80; 08/25/18: 28/80    Time  8    Period  Weeks    Status  Partially Met    Target Date  12/21/18      PT LONG TERM GOAL  #13   TITLE  Patient will ambulate 150 ft with least assistive device and Supervision with no breaks to allow for increased mobility within home.     Baseline  11/7: 174f 12/19: 115 ft  3/3: will assess next session, has done 200 ft with one rest break; 08/26/18: Pt fatigued after 30' today    Time  8    Period  Weeks    Status  Partially Met    Target Date  12/21/18      PT LONG TERM GOAL  #14   TITLE  Patient will decrease 10 meter walk test to <30 seconds as to improve gait speed for better community ambulation and to reduce fall risk.    Baseline  3/3: 57 seconds  with RW and CGA; 08/26/18: 1:41    Time  8    Period  Weeks    Status  On-going    Target Date  12/21/18            Plan - 12/02/18 1428    Clinical Impression Statement  Patient is improving with gait velocity during straight ambulation in hallway. He continues to be challenged with maintaining upright posture resulting in either LOB or buckling of knee. Decreased episode of R knee hyperextension with ambulation, transfers, and turning this session. Turning with RW continues to be challenging and fatiguing to patient at this time. Patient would benefit from additional skilled PT Intervention to improve strength, balance and gait safety.    Rehab Potential  Fair    Clinical Impairments Affecting Rehab Potential  hx of HTN, HLD, CVA, learning disability, Diabetes, brain tumor,     PT Frequency  2x / week    PT Duration  8 weeks    PT Treatment/Interventions  ADLs/Self Care Home Management;Aquatic Therapy;Ultrasound;Moist Heat;Traction;DME  Instruction;Gait training;Stair training;Functional mobility training;Therapeutic activities;Therapeutic exercise;Orthotic Fit/Training;Neuromuscular re-education;Balance training;Patient/family education;Manual techniques;Wheelchair mobility training;Passive range of motion;Energy conservation;Taping;Visual/perceptual remediation/compensation    PT Next Visit Plan  upright posture, R knee hyperextension exercises    PT Home Exercise Plan  TrA contraction, glute sets: needs updates. Potentially some time in supine flat in hospital bed, to improve hip extension deficits.     Consulted and Agree with Plan of Care  Patient       Patient will benefit from skilled therapeutic intervention in order to improve the following deficits and impairments:  Abnormal gait, Decreased activity tolerance, Decreased balance, Decreased knowledge of precautions, Decreased endurance, Decreased coordination, Decreased knowledge of use of DME, Decreased mobility, Decreased range of  motion, Difficulty walking, Decreased safety awareness, Decreased strength, Impaired flexibility, Impaired perceived functional ability, Impaired tone, Postural dysfunction, Improper body mechanics, Pain  Visit Diagnosis: Muscle weakness (generalized)  Other lack of coordination  Other abnormalities of gait and mobility  Hemiplegia and hemiparesis following cerebral infarction affecting right dominant side [facility])     Problem List Patient Active Problem List   Diagnosis Date Noted  . Acute CVA (cerebrovascular accident) ([place]) 05/02/2016  . Left-sided weakness 05/01/2016    Janna Arch, PT, DPT   12/02/2018, 2:57 PM  [place] MAIN [facility] SERVICES [address] [place], [place], [address] Phone: [phone]   Fax:  [phone]  Name: CLEMENTS TORO MRN: [id number] Date of Birth: 12-Sep-1965

## 2018-12-07 ENCOUNTER — Ambulatory Visit: Payer: Medicare HMO | Admitting: Occupational Therapy

## 2018-12-07 ENCOUNTER — Other Ambulatory Visit: Payer: Self-pay

## 2018-12-07 ENCOUNTER — Ambulatory Visit: Payer: Medicare HMO

## 2018-12-07 ENCOUNTER — Encounter: Payer: Self-pay | Admitting: Occupational Therapy

## 2018-12-07 DIAGNOSIS — R2689 Other abnormalities of gait and mobility: Secondary | ICD-10-CM

## 2018-12-07 DIAGNOSIS — R278 Other lack of coordination: Secondary | ICD-10-CM

## 2018-12-07 DIAGNOSIS — M6281 Muscle weakness (generalized): Secondary | ICD-10-CM

## 2018-12-07 NOTE — Therapy (Signed)
[place] MAIN [facility] SERVICES [address] [place], [place], [address] Phone: [phone]   Fax:  [phone]  Occupational Therapy Treatment  Patient Details  Name: Curtis Powell MRN: [id number] Date of Birth: [birth year] No data recorded  Encounter Date: 12/07/2018  OT End of Session - 12/07/18 1312    Visit Number  119    Number of Visits  137    Date for OT Re-Evaluation  02/10/19    OT Start Time  1300    OT Stop Time  1345    OT Time Calculation (min)  45 min    Equipment Utilized During Treatment  Long handled shoe horn    Activity Tolerance  Patient tolerated treatment well    Behavior During Therapy  [facility] for tasks assessed/performed       Past Medical History:  Diagnosis Date  . GERD (gastroesophageal reflux disease)   . Hyperlipidemia   . Hypertension   . Obesity   . Stroke [facility])     Past Surgical History:  Procedure Laterality Date  . BRAIN SURGERY      There were no vitals filed for this visit.  Subjective Assessment - 12/07/18 1305    Subjective   Pt. reports no additional falls.    Patient is accompanied by:  Family member    Pertinent History  Pt. is a 53 y.o. male who suffered a CVA on 05/01/2016. Pt. was admitted to the hospital. Once discharged, he received Home Health PT and OT services for about a month. Pt. has had multiple CVAs over the past 8 years, and has had multiple falls in the past 6 months. Pt. resides in an apartment. Pt. has caregivers for 80 hours. Pt.'s mother stays with pt. at night, and assists with IADL tasks.      Currently in Pain?  No/denies       OT TREATMENT    Neuro muscular re-education:  Pt. worked on using his right hand for grasping 1/8" pegs with long nosed tweezers, and placing them onto a small pegboard. Pt. worked on bilateral [facility] skills using a needle, and thread for sewing using a larger size thread, and needle. Pt. worked on basic sewing skills in preparation for being  able to sew a button onto his shirt, or pants. Pt. used his left hand to stabilize the fabric while using the right hand to hold the needle, and thread it through the fabric. Pt. worked on formulating a straight stitch. Increased time was required to complete the task. The formulated deviated from a straight line.                          OT Education - 12/07/18 1312    Education provided  Yes    Education Details  Fine motor coordination skills    Person(s) Educated  Patient    Methods  Explanation;Demonstration    Comprehension  Verbalized understanding;Returned demonstration;Verbal cues required       OT Short Term Goals - 03/03/17 1459      OT SHORT TERM GOAL #1   Title  `        OT Long Term Goals - 11/18/18 1758      OT LONG TERM GOAL #4   Title  Pt. will perform self dressing with minA and A/E as needed.    Baseline  Pt. has improved with hiking pants/shorts in standing. ModA socks, and min-modA donning pants  over feet. MinA donning a jacket. Indpendent with a T-shirt    Time  12    Period  Weeks    Status  On-going    Target Date  02/10/19      OT LONG TERM GOAL #6   Title  Pt. will write his name efficiently with 100% legibility    Baseline  Pt continues to present with limited legibility and efficiency when writing name to sign in when riding the [place]    Time  12    Period  Weeks    Status  On-going    Target Date  02/10/19      OT LONG TERM GOAL #7   Title  Pt will independently and consistently follow HEP to increase UE strength to increase functional independence    Baseline  Pt to continues to work towards independence with exercises    Time  12    Period  Weeks    Status  On-going    Target Date  02/10/19      OT LONG TERM GOAL #8   Title  Pt. will require supervision ironing a shirt.    Baseline  Pt. requires CGA-minA, and complete set-up seated using a tabletop iron    Time  12    Period  Weeks    Status  On-going    Target  Date  02/10/19      OT LONG TERM GOAL  #9   Baseline  Pt. will independently be able to sew a button onto a shirt.    Time  12    Period  Weeks    Status  On-going    Target Date  02/10/19      OT LONG TERM GOAL  #10   TITLE  Pt. will independently be able to throw a ball    Baseline  Pt. continues to be limited    Time  12    Period  Weeks    Status  On-going    Target Date  02/10/19      OT LONG TERM GOAL  #11   TITLE  Pt. will improve UE functional reaching to be able to independently use his RUE to hand his clothes in the closest.    Baseline  Pt. continues to have difficulty.    Time  12    Period  Weeks    Status  On-going    Target Date  02/10/19            Plan - 12/07/18 1313    Clinical Impression Statement Pt. is making progress overall. Pt. presents with impaired functional reaching with the RUE, and [facility] skills. Pt. continues to work on improving strength, and [facility] skills in order to improve RUE engagement during ADLs, IADLs, sewing, and ironing.    OT Occupational Profile and History  Problem Focused Assessment - Including review of records relating to presenting problem    Occupational performance deficits (Please refer to evaluation for details):  ADL's;IADL's    Body Structure / Function / Physical Skills  ADL;FMC;ROM;IADL;Pain;Coordination    Rehab Potential  Fair    Clinical Decision Making  Several treatment options, min-mod task modification necessary    OT Frequency  2x / week    OT Duration  12 weeks    OT Treatment/Interventions  Self-care/ADL training;Patient/family education;DME and/or AE instruction;Therapeutic exercise;Moist Heat;Neuromuscular education    Consulted and Agree with Plan of Care  Patient       Patient will  benefit from skilled therapeutic intervention in order to improve the following deficits and impairments:   Body Structure / Function / Physical Skills: ADL, FMC, ROM, IADL, Pain, Coordination       Visit  Diagnosis: Muscle weakness (generalized)  Other lack of coordination    Problem List Patient Active Problem List   Diagnosis Date Noted  . Acute CVA (cerebrovascular accident) ([place]) 05/02/2016  . Left-sided weakness 05/01/2016    Harrel Carina, MS, OTR/L 12/07/2018, 1:27 PM  [place] MAIN [name] SERVICES [address] [place], [place], [address] Phone: [phone]   Fax:  [phone]  Name: Curtis Powell MRN: [id number] Date of Birth: 30-Sep-1965

## 2018-12-07 NOTE — Therapy (Signed)
[place] MAIN [facility] SERVICES [address] [place], [place], [address] Phone: [phone]   Fax:  [phone]  Physical Therapy Treatment  Patient Details  Name: Curtis Powell MRN: [id number] Date of Birth: 01/05/1966 No data recorded  Encounter Date: 12/07/2018  PT End of Session - 12/07/18 1421    Visit Number  133    Number of Visits  139    Date for PT Re-Evaluation  12/21/18    Authorization Type  3/10 PN 8/19    Authorization Time Period  authorized 05/12/18-11/10/18    PT Start Time  1346    PT Stop Time  1429    PT Time Calculation (min)  43 min    Equipment Utilized During Treatment  Gait belt    Activity Tolerance  Patient tolerated treatment well    Behavior During Therapy  [facility] for tasks assessed/performed       Past Medical History:  Diagnosis Date  . GERD (gastroesophageal reflux disease)   . Hyperlipidemia   . Hypertension   . Obesity   . Stroke [facility])     Past Surgical History:  Procedure Laterality Date  . BRAIN SURGERY      There were no vitals filed for this visit.  Subjective Assessment - 12/07/18 1419    Subjective  patient reports no falls since last session. Has been compliant with HEP. Is wearing his knee brace incorrectly.    Pertinent History   Patient is a pleasant 53 year old male who presents to physical therapy for weakness and immobility secondary to CVA.  Had a stroke in Feb 2018. He was previously fully independent, but this stroke caused severe residual deficits, mainly on the right side as well as speech, and now he is unable to walk or perform most of his ADLs on his own. Entire right side is very weak. He still has a little difficulty with speech but his swallowing is improved to baseline. His mom had to move in with him and now is his main caregiver.     Limitations  Sitting;Lifting;Standing;Walking;House hold activities    How long can you sit comfortably?  5 minutes    How long can you stand  comfortably?  5 minutes    How long can you walk comfortably?  10 ft    Patient Stated Goals  Walk without walker, walk further with walker, get some strength in back     Currently in Pain?  No/denies         Ther-ex  Sit to stand 5x with mod cueing for sequencing/gluteal squeeze, CGA to reduce "plopping" of poor eccentric control; 10 second holds; tactile assistance and verbal cueing for forward pelvic control for upright posture    Ambulate with RW and CGA  100 ft; Frequent cues for upright posture and to increase hip flexion to improve toe clearance during swing, tactile cues to PSIS and ASIS for anterior weight shift/step length; w/c follow verbal cueing for upright posture       Standing  -static upright posture with mirror in front of patient 60 seconds.    Backwards ambulation in // bars with cueing for bigger step length, upright posture, and hand positioning 2x length of // bars. Cueing to PSIS/ASIS for optimal muscle recruitment and positioning    Standing heel toe rocker with large step positioning 10x each leg, BUE support cueing for upright positioning and foot position  Side stepping in // bars 2x length of //  bars, CGA  Seated:   Seated adduction squeezes 15x 3 second holds     seated upright posture marches for core activation with arms crossed against chest x 15 each LE   Seated adduction ball squeezes 3 second holds 15x   5lb ankle weights -IR/ER 15x -LAQ 3 second holds 15x  -upright posture marching with focus on knee positioning for muscle activation. 15x  Ankle circles 10x clockwise, 10x counterclockwise on dynadisc, requires Mod A for RLE  Bathroom mobility: Min A required for pulling pants/pampers back up due to limited stability with standing reach.       Pt educated throughout session about proper posture and technique with exercises. Improved exercise technique, movement at target joints, use of target muscles after min to mod verbal, visual,  tactile cues  Discussed with patient that using the restroom occasionally during session is ok but having to use it every session is detracting from physical therapy session.                          PT Education - 12/07/18 1420    Education provided  Yes    Education Details  need to use the restroom before coming to physical therapy. body mechanics. posture    Person(s) Educated  Patient    Methods  Explanation;Demonstration;Tactile cues;Verbal cues    Comprehension  Verbalized understanding;Returned demonstration;Verbal cues required;Tactile cues required       PT Short Term Goals - 10/26/18 1719      PT SHORT TERM GOAL #1   Title  Patient will be independent in home exercise program to improve strength/mobility for better functional independence with ADLs.    Baseline  hep compliant    Time  2    Period  Weeks    Status  Achieved      PT SHORT TERM GOAL #2   Title  Patient will require min cueing for STS transfer with CGA for increased independence with mobility.     Baseline  CGA    Time  2    Period  Weeks    Status  Achieved      PT SHORT TERM GOAL #3   Title  Patient will maintain upright posture for > 15 seconds to demonstrate strengthened postural control muscualture     Baseline  18 seconds     Time  2    Period  Weeks    Status  Achieved        PT Long Term Goals - 10/26/18 0001      PT LONG TERM GOAL #1   Title  Patient will perform 10 MWT in < 1 minute to demonstrate improved ambulatory velocity and capacity.     Baseline  4/10: 2 minutes 45 seconds; 5/29: 2 minute 31 seconds; 3 minutes 22 seconds 8/22: 3 minutes 2 seconds  9/12:  2 minutes and 43 seconds 10/15: 18mnute 12 seconds 11/7: 1 minute 21 seconds 12/19: 1 min 16 seconds 3/3: 57 seconds; 08/26/18: 1:41.4s= 0.10 m/s 7/1: 1 minute 14 seconds    Time  8    Period  Weeks    Status  Partially Met    Target Date  12/21/18      PT LONG TERM GOAL #2   Title  Patient (< 633years  old) will complete five times sit to stand test in < 10 seconds indicating an increased LE strength and improved balance.    Baseline  5/19:  20.6s from [facility], one hand on walker and one on WC, 80% upright posture 7/1: 22 seconds one hand on walker one hand on w/c     Time  8    Period  Weeks    Status  Partially Met    Target Date  12/21/18      PT LONG TERM GOAL #3   Title  Patient will increase Berg Balance score by > 6 points (26/56) to demonstrate decreased fall risk during functional activities.    Baseline  9/12: 20/56 10/15: 21/56 11/7: 20/56 12/19: 22/56; 08/26/18: 11/56 7/1: 17/56     Time  8    Period  Weeks    Status  Partially Met    Target Date  12/21/18      PT LONG TERM GOAL #4   Title  Patient will increase BLE gross strength to 4+/5 as to improve functional strength for independent gait, increased standing tolerance and increased ADL ability.    Baseline   4-/5 RLE, 10/15: 4/5 RLE 11/7: 4/5 RLE 3/3: 4/5 7/1:  hip flexion 4/5 hip extension 2/5 hip abduction/adduciton 2/5     Time  8    Period  Weeks    Status  On-going    Target Date  12/21/18      PT LONG TERM GOAL #5   Title  Patient will increase lower extremity functional scale to >40/80 to demonstrate improved functional mobility and increased tolerance with ADLs.     Baseline  14/80; 12/26: 21/80; 2/18: 28/80  4/10; 25/80 5/29: 27/80; 7/16: 29/80 8/22: 30/80  9/12: 26/80 (feeling more tired due to moving) 10/15: 37/80 11/7: 23/80 3/3: 32/80; 08/25/18: 28/80    Time  8    Period  Weeks    Status  Partially Met    Target Date  12/21/18      PT LONG TERM GOAL  #13   TITLE  Patient will ambulate 150 ft with least assistive device and Supervision with no breaks to allow for increased mobility within home.     Baseline  11/7: 172f 12/19: 115 ft  3/3: will assess next session, has done 200 ft with one rest break; 08/26/18: Pt fatigued after 30' today    Time  8    Period  Weeks    Status  Partially Met    Target Date   12/21/18      PT LONG TERM GOAL  #14   TITLE  Patient will decrease 10 meter walk test to <30 seconds as to improve gait speed for better community ambulation and to reduce fall risk.    Baseline  3/3: 57 seconds with RW and CGA; 08/26/18: 1:41    Time  8    Period  Weeks    Status  On-going    Target Date  12/21/18            Plan - 12/07/18 1438    Clinical Impression Statement  Patient is challenged with posterior chain muscle activation interventions resulting in fatigue and need for seated rest breaks. Patient is progressing with ambulatory velocity and posture however his initial contact with the forefoot of his RLE limits his stability and weight acceptance when fatigued. Patient would benefit from additional skilled PT Intervention to improve strength, balance and gait safety.    Rehab Potential  Fair    Clinical Impairments Affecting Rehab Potential  hx of HTN, HLD, CVA, learning disability, Diabetes, brain tumor,     PT Frequency  2x / week    PT Duration  8 weeks    PT Treatment/Interventions  ADLs/Self Care Home Management;Aquatic Therapy;Ultrasound;Moist Heat;Traction;DME Instruction;Gait training;Stair training;Functional mobility training;Therapeutic activities;Therapeutic exercise;Orthotic Fit/Training;Neuromuscular re-education;Balance training;Patient/family education;Manual techniques;Wheelchair mobility training;Passive range of motion;Energy conservation;Taping;Visual/perceptual remediation/compensation    PT Next Visit Plan  upright posture, R knee hyperextension exercises    PT Home Exercise Plan  TrA contraction, glute sets: needs updates. Potentially some time in supine flat in hospital bed, to improve hip extension deficits.     Consulted and Agree with Plan of Care  Patient       Patient will benefit from skilled therapeutic intervention in order to improve the following deficits and impairments:  Abnormal gait, Decreased activity tolerance, Decreased balance,  Decreased knowledge of precautions, Decreased endurance, Decreased coordination, Decreased knowledge of use of DME, Decreased mobility, Decreased range of motion, Difficulty walking, Decreased safety awareness, Decreased strength, Impaired flexibility, Impaired perceived functional ability, Impaired tone, Postural dysfunction, Improper body mechanics, Pain  Visit Diagnosis: Muscle weakness (generalized)  Other lack of coordination  Other abnormalities of gait and mobility     Problem List Patient Active Problem List   Diagnosis Date Noted  . Acute CVA (cerebrovascular accident) ([place]) 05/02/2016  . Left-sided weakness 05/01/2016   Janna Arch, PT, DPT   12/07/2018, 2:41 PM  [place] MAIN [facility] SERVICES [address] [place], [place], [address] Phone: [phone]   Fax:  [phone]  Name: Curtis Powell MRN: [id number] Date of Birth: [birth year]

## 2018-12-09 ENCOUNTER — Ambulatory Visit: Payer: Medicare HMO | Attending: Family Medicine | Admitting: Occupational Therapy

## 2018-12-09 ENCOUNTER — Ambulatory Visit: Payer: Medicare HMO

## 2018-12-09 ENCOUNTER — Encounter: Payer: Self-pay | Admitting: Occupational Therapy

## 2018-12-09 ENCOUNTER — Other Ambulatory Visit: Payer: Self-pay

## 2018-12-09 DIAGNOSIS — I69351 Hemiplegia and hemiparesis following cerebral infarction affecting right dominant side: Secondary | ICD-10-CM | POA: Diagnosis present

## 2018-12-09 DIAGNOSIS — M6281 Muscle weakness (generalized): Secondary | ICD-10-CM

## 2018-12-09 DIAGNOSIS — R278 Other lack of coordination: Secondary | ICD-10-CM | POA: Insufficient documentation

## 2018-12-09 DIAGNOSIS — R2689 Other abnormalities of gait and mobility: Secondary | ICD-10-CM | POA: Diagnosis present

## 2018-12-09 NOTE — Therapy (Signed)
[place] MAIN [facility] SERVICES [address] [place], [place], [address] Phone: [phone]   Fax:  [phone]  Physical Therapy Treatment  Patient Details  Name: Curtis Powell MRN: [id number] Date of Birth: 1966-03-06 No data recorded  Encounter Date: 12/09/2018  PT End of Session - 12/09/18 1355    Visit Number  [id number]    Number of Visits  139    Date for PT Re-Evaluation  12/21/18    Authorization Type  4/10 PN 8/19    Authorization Time Period  authorized 05/12/18-11/10/18    PT Start Time  7253    PT Stop Time  1430    PT Time Calculation (min)  35 min    Equipment Utilized During Treatment  Gait belt    Activity Tolerance  Patient tolerated treatment well    Behavior During Therapy  [facility] for tasks assessed/performed       Past Medical History:  Diagnosis Date  . GERD (gastroesophageal reflux disease)   . Hyperlipidemia   . Hypertension   . Obesity   . Stroke [facility])     Past Surgical History:  Procedure Laterality Date  . BRAIN SURGERY      There were no vitals filed for this visit.       Ther-ex  Sit to stand 5x with mod cueing for sequencing/gluteal squeeze, CGA to reduce "plopping" of poor eccentric control; 10 second holds; tactile assistance and verbal cueing for forward pelvic control for upright posture    Ambulate with RW and CGA  100 ft; Frequent cues for upright posture and to increase hip flexion to improve toe clearance during swing, tactile cues to PSIS and ASIS for anterior weight shift/step length; w/c follow verbal cueing for upright posture        Standing  -static upright posture with mirror in front of patient 60 seconds.    Backwards ambulation in // bars with cueing for bigger step length, upright posture, and hand positioning 2x length of // bars. Cueing to PSIS/ASIS for optimal muscle recruitment and positioning   Side stepping in // bars 2x length of // bars, CGA   Seated:   Seated adduction  squeezes 15x 3 second holds     seated upright posture marches for core activation with arms crossed against chest x 15 each LE     Seated adduction ball squeezes 3 second holds 15x   5lb ankle weights -IR/ER 15x -LAQ 3 second holds 15x   Seated heel toe raises 20x    Seated cross body punches for trunk control and abdominal strengthening x 4 minutes with PT holding pads for arc of motion. Patient challenged with upright positioning with cross body movements. Added ducking portion to return to upright for last 2 minutes.   Bathroom mobility: Min A required for pulling pants/pampers back up due to limited stability with standing reach.      Pt educated throughout session about proper posture and technique with exercises. Improved exercise technique, movement at target joints, use of target muscles after min to mod verbal, visual, tactile cues   Discussed with patient that using the restroom occasionally during session is ok but having to use it every session is detracting from physical therapy session.                          PT Education - 12/09/18 1353    Education provided  Yes    Education  Details  exercise technique, body mechanics    Person(s) Educated  Patient    Methods  Explanation;Demonstration;Tactile cues;Verbal cues    Comprehension  Verbalized understanding;Returned demonstration;Verbal cues required;Tactile cues required       PT Short Term Goals - 10/26/18 1719      PT SHORT TERM GOAL #1   Title  Patient will be independent in home exercise program to improve strength/mobility for better functional independence with ADLs.    Baseline  hep compliant    Time  2    Period  Weeks    Status  Achieved      PT SHORT TERM GOAL #2   Title  Patient will require min cueing for STS transfer with CGA for increased independence with mobility.     Baseline  CGA    Time  2    Period  Weeks    Status  Achieved      PT SHORT TERM GOAL #3   Title   Patient will maintain upright posture for > 15 seconds to demonstrate strengthened postural control muscualture     Baseline  18 seconds     Time  2    Period  Weeks    Status  Achieved        PT Long Term Goals - 10/26/18 0001      PT LONG TERM GOAL #1   Title  Patient will perform 10 MWT in < 1 minute to demonstrate improved ambulatory velocity and capacity.     Baseline  4/10: 2 minutes 45 seconds; 5/29: 2 minute 31 seconds; 3 minutes 22 seconds 8/22: 3 minutes 2 seconds  9/12:  2 minutes and 43 seconds 10/15: 61mnute 12 seconds 11/7: 1 minute 21 seconds 12/19: 1 min 16 seconds 3/3: 57 seconds; 08/26/18: 1:41.4s= 0.10 m/s 7/1: 1 minute 14 seconds    Time  8    Period  Weeks    Status  Partially Met    Target Date  12/21/18      PT LONG TERM GOAL #2   Title  Patient (< 634years old) will complete five times sit to stand test in < 10 seconds indicating an increased LE strength and improved balance.    [facility]  [facility] and one on WC, 80% upright posture 7/1: 22 seconds one hand on walker one hand on w/c     Time  8    Period  Weeks    Status  Partially Met    Target Date  12/21/18      PT LONG TERM GOAL #3   Title  Patient will increase Berg Balance score by > 6 points (26/56) to demonstrate decreased fall risk during functional activities.    Baseline  9/12: 20/56 10/15: 21/56 11/7: 20/56 12/19: 22/56; 08/26/18: 11/56 7/1: 17/56     Time  8    Period  Weeks    Status  Partially Met    Target Date  12/21/18      PT LONG TERM GOAL #4   Title  Patient will increase BLE gross strength to 4+/5 as to improve functional strength for independent gait, increased standing tolerance and increased ADL ability.    Baseline   4-/5 RLE, 10/15: 4/5 RLE 11/7: 4/5 RLE 3/3: 4/5 7/1:  hip flexion 4/5 hip extension 2/5 hip abduction/adduciton 2/5     Time  8    Period  Weeks    Status  On-going  Target Date  12/21/18      PT LONG TERM GOAL #5   Title  Patient  will increase lower extremity functional scale to >40/80 to demonstrate improved functional mobility and increased tolerance with ADLs.     Baseline  14/80; 12/26: 21/80; 2/18: 28/80  4/10; 25/80 5/29: 27/80; 7/16: 29/80 8/22: 30/80  9/12: 26/80 (feeling more tired due to moving) 10/15: 37/80 11/7: 23/80 3/3: 32/80; 08/25/18: 28/80    Time  8    Period  Weeks    Status  Partially Met    Target Date  12/21/18      PT LONG TERM GOAL  #13   TITLE  Patient will ambulate 150 ft with least assistive device and Supervision with no breaks to allow for increased mobility within home.     Baseline  11/7: 145f 12/19: 115 ft  3/3: will assess next session, has done 200 ft with one rest break; 08/26/18: Pt fatigued after 30' today    Time  8    Period  Weeks    Status  Partially Met    Target Date  12/21/18      PT LONG TERM GOAL  #14   TITLE  Patient will decrease 10 meter walk test to <30 seconds as to improve gait speed for better community ambulation and to reduce fall risk.    Baseline  3/3: 57 seconds with RW and CGA; 08/26/18: 1:41    Time  8    Period  Weeks    Status  On-going    Target Date  12/21/18            Plan - 12/09/18 1415    Clinical Impression Statement  Patient session limited by patient needing to use restroom. Patient educated on need to discuss increased urination rate with physician due to change in status. Patient agreeable to let physician know. Patient progressing with ambulatory velocity and foot clearance however continues to be limited with right foot heel stroke/initial contact. Patient would benefit from additional skilled PT Intervention to improve strength, balance and gait safety.    Rehab Potential  Fair    Clinical Impairments Affecting Rehab Potential  hx of HTN, HLD, CVA, learning disability, Diabetes, brain tumor,     PT Frequency  2x / week    PT Duration  8 weeks    PT Treatment/Interventions  ADLs/Self Care Home Management;Aquatic  Therapy;Ultrasound;Moist Heat;Traction;DME Instruction;Gait training;Stair training;Functional mobility training;Therapeutic activities;Therapeutic exercise;Orthotic Fit/Training;Neuromuscular re-education;Balance training;Patient/family education;Manual techniques;Wheelchair mobility training;Passive range of motion;Energy conservation;Taping;Visual/perceptual remediation/compensation    PT Next Visit Plan  upright posture, R knee hyperextension exercises    PT Home Exercise Plan  TrA contraction, glute sets: needs updates. Potentially some time in supine flat in hospital bed, to improve hip extension deficits.     Consulted and Agree with Plan of Care  Patient       Patient will benefit from skilled therapeutic intervention in order to improve the following deficits and impairments:  Abnormal gait, Decreased activity tolerance, Decreased balance, Decreased knowledge of precautions, Decreased endurance, Decreased coordination, Decreased knowledge of use of DME, Decreased mobility, Decreased range of motion, Difficulty walking, Decreased safety awareness, Decreased strength, Impaired flexibility, Impaired perceived functional ability, Impaired tone, Postural dysfunction, Improper body mechanics, Pain  Visit Diagnosis: Muscle weakness (generalized)  Other lack of coordination  Other abnormalities of gait and mobility  Hemiplegia and hemiparesis following cerebral infarction affecting right dominant side ([facility]     Problem List Patient Active Problem  List   Diagnosis Date Noted  . Acute CVA (cerebrovascular accident) ([place]) 05/02/2016  . Left-sided weakness 05/01/2016   Janna Arch, PT, DPT   12/09/2018, 2:37 PM  [place] MAIN [facility] SERVICES [address] [place], [place], [address] Phone: [phone]   Fax:  [phone]  Name: Curtis Powell MRN: [id number] Date of Birth: Feb 11, 1966

## 2018-12-09 NOTE — Therapy (Signed)
[place] MAIN [facility] SERVICES [address] [place], [place], [address] Phone: [phone]   Fax:  [phone]  Occupational Therapy Progress Note  Dates of reporting period 09/14/2018   to   12/09/2018  Patient Details  Name: Curtis Powell MRN: [id number] Date of Birth: 03-27-1966 No data recorded  Encounter Date: 12/09/2018  OT End of Session - 12/09/18 1310    Visit Number  120    Number of Visits  137   Date for OT Re-Evaluation  02/10/19    Authorization Type  Progress reporting period starting 12/09/2018    OT Start Time  1300    OT Stop Time  1355   OT Time Calculation (min)  55 min    Equipment Utilized During Treatment  Long handled shoe horn    Activity Tolerance  Patient tolerated treatment well    Behavior During Therapy  WFL for tasks assessed/performed       Past Medical History:  Diagnosis Date  . GERD (gastroesophageal reflux disease)   . Hyperlipidemia   . Hypertension   . Obesity   . Stroke [facility])     Past Surgical History:  Procedure Laterality Date  . BRAIN SURGERY      There were no vitals filed for this visit.  OT TREATMENT    Neuro muscular re-education:  Pt. worked on grasping large flat shapes, and moving them through 3 rungs of varying heights of vertical dowels. Pt. worked on basic sewing skills in preparation for being able to sew a button onto his shirt, or pants. Pt. used his left hand to stabilize the fabric while using the right hand to hold the needle, and thread it through the fabric. Pt. worked on formulating a straight stitch. Increased time was required to complete the task. Pt. worked on manipulating a smaller needle, and thread. Pt. had difficulty with threading the end of a small needle with thread.  Self-care:  Pt. required CGA standing to perform toileting skills, and clothing negotiation. Independent with set-up for hand hygiene. Pt. required increased assist to place manuever w/c  Over  the threshold.                         OT Education - 12/09/18 1310    Education provided  Yes    Person(s) Educated  Patient    Methods  Explanation;Demonstration    Comprehension  Verbalized understanding;Returned demonstration;Verbal cues required       OT Short Term Goals - 03/03/17 1459      OT SHORT TERM GOAL #1   Title  `        OT Long Term Goals - 12/09/18 1311      OT LONG TERM GOAL #2   Title  Pt. will complete self grooming with minA      OT LONG TERM GOAL #4   Title  Pt. will perform self dressing with minA and A/E as needed.    Baseline  Pt. has improved with hiking pants/shorts in standing. ModA socks, and min-modA donning pants over feet. MinA donning a jacket. Indpendent with a T-shirt    Time  12    Period  Weeks    Target Date  02/10/19      OT LONG TERM GOAL #6   Title  Pt. will write his name efficiently with 100% legibility    Baseline  Pt continues to present with limited legibility and efficiency when  writing name to sign in when riding the [place]    Time  12    Period  Weeks    Status  On-going    Target Date  02/10/19      OT LONG TERM GOAL #7   Title  Pt will independently and consistently follow HEP to increase UE strength to increase functional independence    Baseline  Pt to continues to work towards independence with exercises    Time  12    Period  Weeks    Status  On-going    Target Date  02/10/19      OT LONG TERM GOAL #8   Title  Pt. will require supervision ironing a shirt.    Baseline  Pt. requires CGA-minA, and complete set-up seated using a tabletop iron    Time  12    Period  Weeks    Status  On-going    Target Date  02/10/19      OT LONG TERM GOAL  #9   Baseline  Pt. will independently be able to sew a button onto a shirt.    Time  12    Period  Weeks    Status  On-going    Target Date  02/10/19      OT LONG TERM GOAL  #10   TITLE  Pt. will independently be able to throw a ball    Baseline  Pt.  continues to be limited    Time  12    Period  Weeks    Target Date  02/10/19      OT LONG TERM GOAL  #11   TITLE  Pt. will improve UE functional reaching to be able to independently use his RUE to hand his clothes in the closest.    Baseline  Pt. continues to have difficulty.    Time  12    Period  Weeks    Status  On-going    Target Date  02/10/19            Plan - 12/09/18 1310    Clinical Impression Statement Pt. is making progress overall, and is engaging his hands more during ADLs and IADL tasks.Pt. is now able to tie his shoes better, and is able to use his hand to manipulate the buttons on his TV, and on the ROKU better. Pt. continues to to work on improving functional reaching with his RUE, [facility] skills, performing self-care, tasks, and using his UEs to push objects.    OT Occupational Profile and History  Problem Focused Assessment - Including review of records relating to presenting problem    Occupational performance deficits (Please refer to evaluation for details):  ADL's;IADL's    Body Structure / Function / Physical Skills  ADL;FMC;ROM;IADL;Pain;Coordination    Rehab Potential  Fair    Clinical Decision Making  Several treatment options, min-mod task modification necessary    OT Frequency  2x / week    OT Duration  12 weeks    OT Treatment/Interventions  Self-care/ADL training;Patient/family education;DME and/or AE instruction;Therapeutic exercise;Moist Heat;Neuromuscular education    Consulted and Agree with Plan of Care  Patient       Patient will benefit from skilled therapeutic intervention in order to improve the following deficits and impairments:   Body Structure / Function / Physical Skills: ADL, FMC, ROM, IADL, Pain, Coordination       Visit Diagnosis: Muscle weakness (generalized)  Other lack of coordination    Problem List Patient  Active Problem List   Diagnosis Date Noted  . Acute CVA (cerebrovascular accident) ([place]) 05/02/2016  .  Left-sided weakness 05/01/2016    Harrel Carina, MS, OTR/L 12/09/2018, 1:42 PM  [place] MAIN [facility] SERVICES [address] [place], [place], [address] Phone: [phone]   Fax:  [phone]  Name: Curtis Powell MRN: [id number] Date of Birth: Jan 22, 1966

## 2018-12-15 ENCOUNTER — Other Ambulatory Visit: Payer: Self-pay | Admitting: Internal Medicine

## 2018-12-15 DIAGNOSIS — I350 Nonrheumatic aortic (valve) stenosis: Secondary | ICD-10-CM

## 2018-12-15 DIAGNOSIS — I6523 Occlusion and stenosis of bilateral carotid arteries: Secondary | ICD-10-CM | POA: Insufficient documentation

## 2018-12-15 DIAGNOSIS — R0600 Dyspnea, unspecified: Secondary | ICD-10-CM

## 2018-12-15 DIAGNOSIS — R0609 Other forms of dyspnea: Secondary | ICD-10-CM

## 2018-12-16 ENCOUNTER — Ambulatory Visit: Payer: Medicare HMO | Admitting: Occupational Therapy

## 2018-12-16 ENCOUNTER — Other Ambulatory Visit: Payer: Self-pay

## 2018-12-16 ENCOUNTER — Ambulatory Visit: Payer: Medicare HMO

## 2018-12-16 ENCOUNTER — Encounter: Payer: Self-pay | Admitting: Occupational Therapy

## 2018-12-16 DIAGNOSIS — M6281 Muscle weakness (generalized): Secondary | ICD-10-CM

## 2018-12-16 DIAGNOSIS — I69351 Hemiplegia and hemiparesis following cerebral infarction affecting right dominant side: Secondary | ICD-10-CM

## 2018-12-16 DIAGNOSIS — R278 Other lack of coordination: Secondary | ICD-10-CM

## 2018-12-16 DIAGNOSIS — R2689 Other abnormalities of gait and mobility: Secondary | ICD-10-CM

## 2018-12-16 NOTE — Therapy (Signed)
[place] MAIN [facility] SERVICES [address] [place], [place], [address] Phone: [phone]   Fax:  [phone]  Physical Therapy Treatment  Patient Details  Name: Curtis Powell MRN: [id number] Date of Birth: 06-23-1965 No data recorded  Encounter Date: 12/16/2018  PT End of Session - 12/16/18 1355    Visit Number  135    Number of Visits  139    Date for PT Re-Evaluation  12/21/18    Authorization Type  5/10 PN 8/19    Authorization Time Period  authorized 05/12/18-11/10/18    PT Start Time  6962    PT Stop Time  1429    PT Time Calculation (min)  44 min    Equipment Utilized During Treatment  Gait belt    Activity Tolerance  Patient tolerated treatment well    Behavior During Therapy  [facility] for tasks assessed/performed       Past Medical History:  Diagnosis Date  . GERD (gastroesophageal reflux disease)   . Hyperlipidemia   . Hypertension   . Obesity   . Stroke [facility])     Past Surgical History:  Procedure Laterality Date  . BRAIN SURGERY      There were no vitals filed for this visit.  Subjective Assessment - 12/16/18 1353    Subjective  Patient reports no falls since last session. Didn't do much besides watch TV and do HEP over the weekend.    Pertinent History   Patient is a pleasant 53 year old male who presents to physical therapy for weakness and immobility secondary to CVA.  Had a stroke in Feb 2018. He was previously fully independent, but this stroke caused severe residual deficits, mainly on the right side as well as speech, and now he is unable to walk or perform most of his ADLs on his own. Entire right side is very weak. He still has a little difficulty with speech but his swallowing is improved to baseline. His mom had to move in with him and now is his main caregiver.     Limitations  Sitting;Lifting;Standing;Walking;House hold activities    How long can you sit comfortably?  5 minutes    How long can you stand  comfortably?  5 minutes    How long can you walk comfortably?  10 ft    Patient Stated Goals  Walk without walker, walk further with walker, get some strength in back     Currently in Pain?  No/denies            Ther-ex  Sit to stand 5x with mod cueing for sequencing/gluteal squeeze, CGA to reduce "plopping" of poor eccentric control; 10 second holds; tactile assistance and verbal cueing for forward pelvic control for upright posture    Ambulate with RW and CGA  100 ft; Frequent cues for upright posture and to increase hip flexion to improve toe clearance during swing, tactile cues to PSIS and ASIS for anterior weight shift/step length; w/c follow verbal cueing for upright posture       Seated:   Seated adduction squeezes 15x 3 second holds     seated upright posture marches for core activation with arms crossed against chest x 15 each LE   5lb ankle weights -IR/ER 20x -LAQ 3 second holds 15x   Seated heel toe raises 20x    seated soccer ball kicks 2 minutes for LAQ coordination/muscle contraction and positional awareness in space   Seated cross body punches for trunk  control and abdominal strengthening x 4 minutes with PT holding pads for arc of motion. Patient challenged with upright positioning with cross body movements. Added ducking portion to return to upright for last 2 minutes.    Bathroom mobility: Min A required for pulling pants/pampers back up due to limited stability with standing reach. Requires Mod A for wheelchair propulsion to align self in proper position for toilet.      Pt educated throughout session about proper posture and technique with exercises. Improved exercise technique, movement at target joints, use of target muscles after min to mod verbal, visual, tactile cues   Discussed with patient that using the restroom occasionally during session is ok but having to use it every session is detracting from physical therapy session.                          PT Education - 12/16/18 1354    Education provided  Yes    Education Details  exercise technique, body mechanics    Person(s) Educated  Patient    Methods  Explanation;Demonstration;Tactile cues;Verbal cues    Comprehension  Verbalized understanding;Returned demonstration;Verbal cues required;Tactile cues required       PT Short Term Goals - 10/26/18 1719      PT SHORT TERM GOAL #1   Title  Patient will be independent in home exercise program to improve strength/mobility for better functional independence with ADLs.    Baseline  hep compliant    Time  2    Period  Weeks    Status  Achieved      PT SHORT TERM GOAL #2   Title  Patient will require min cueing for STS transfer with CGA for increased independence with mobility.     Baseline  CGA    Time  2    Period  Weeks    Status  Achieved      PT SHORT TERM GOAL #3   Title  Patient will maintain upright posture for > 15 seconds to demonstrate strengthened postural control muscualture     Baseline  18 seconds     Time  2    Period  Weeks    Status  Achieved        PT Long Term Goals - 10/26/18 0001      PT LONG TERM GOAL #1   Title  Patient will perform 10 MWT in < 1 minute to demonstrate improved ambulatory velocity and capacity.     Baseline  4/10: 2 minutes 45 seconds; 5/29: 2 minute 31 seconds; 3 minutes 22 seconds 8/22: 3 minutes 2 seconds  9/12:  2 minutes and 43 seconds 10/15: 76mnute 12 seconds 11/7: 1 minute 21 seconds 12/19: 1 min 16 seconds 3/3: 57 seconds; 08/26/18: 1:41.4s= 0.10 m/s 7/1: 1 minute 14 seconds    Time  8    Period  Weeks    Status  Partially Met    Target Date  12/21/18      PT LONG TERM GOAL #2   Title  Patient (< 661years old) will complete five times sit to stand test in < 10 seconds indicating an increased LE strength and improved balance.    Baseline  5/19: [facility] hand on walker and one on WC, 80% upright posture 7/1: 22 seconds one  hand on walker one hand on w/c     Time  8    Period  Weeks  Status  Partially Met    Target Date  12/21/18      PT LONG TERM GOAL #3   Title  Patient will increase Berg Balance score by > 6 points (26/56) to demonstrate decreased fall risk during functional activities.    Baseline  9/12: 20/56 10/15: 21/56 11/7: 20/56 12/19: 22/56; 08/26/18: 11/56 7/1: 17/56     Time  8    Period  Weeks    Status  Partially Met    Target Date  12/21/18      PT LONG TERM GOAL #4   Title  Patient will increase BLE gross strength to 4+/5 as to improve functional strength for independent gait, increased standing tolerance and increased ADL ability.    Baseline   4-/5 RLE, 10/15: 4/5 RLE 11/7: 4/5 RLE 3/3: 4/5 7/1:  hip flexion 4/5 hip extension 2/5 hip abduction/adduciton 2/5     Time  8    Period  Weeks    Status  On-going    Target Date  12/21/18      PT LONG TERM GOAL #5   Title  Patient will increase lower extremity functional scale to >40/80 to demonstrate improved functional mobility and increased tolerance with ADLs.     Baseline  14/80; 12/26: 21/80; 2/18: 28/80  4/10; 25/80 5/29: 27/80; 7/16: 29/80 8/22: 30/80  9/12: 26/80 (feeling more tired due to moving) 10/15: 37/80 11/7: 23/80 3/3: 32/80; 08/25/18: 28/80    Time  8    Period  Weeks    Status  Partially Met    Target Date  12/21/18      PT LONG TERM GOAL  #13   TITLE  Patient will ambulate 150 ft with least assistive device and Supervision with no breaks to allow for increased mobility within home.     Baseline  11/7: 112f 12/19: 115 ft  3/3: will assess next session, has done 200 ft with one rest break; 08/26/18: Pt fatigued after 30' today    Time  8    Period  Weeks    Status  Partially Met    Target Date  12/21/18      PT LONG TERM GOAL  #14   TITLE  Patient will decrease 10 meter walk test to <30 seconds as to improve gait speed for better community ambulation and to reduce fall risk.    Baseline  3/3: 57 seconds with RW and  CGA; 08/26/18: 1:41    Time  8    Period  Weeks    Status  On-going    Target Date  12/21/18            Plan - 12/16/18 1418    Clinical Impression Statement  Patient is more out of breathe today than previous sessions requiring more frequent rest breaks.  Patient session continues to require use of restroom, using this time to address standing reach and wheelchair mobility. Patient would benefit from additional skilled PT Intervention to improve strength, balance and gait safety.    Rehab Potential  Fair    Clinical Impairments Affecting Rehab Potential  hx of HTN, HLD, CVA, learning disability, Diabetes, brain tumor,     PT Frequency  2x / week    PT Duration  8 weeks    PT Treatment/Interventions  ADLs/Self Care Home Management;Aquatic Therapy;Ultrasound;Moist Heat;Traction;DME Instruction;Gait training;Stair training;Functional mobility training;Therapeutic activities;Therapeutic exercise;Orthotic Fit/Training;Neuromuscular re-education;Balance training;Patient/family education;Manual techniques;Wheelchair mobility training;Passive range of motion;Energy conservation;Taping;Visual/perceptual remediation/compensation    PT Next Visit Plan  recert    PT Home Exercise Plan  TrA contraction, glute sets: needs updates. Potentially some time in supine flat in hospital bed, to improve hip extension deficits.     Consulted and Agree with Plan of Care  Patient       Patient will benefit from skilled therapeutic intervention in order to improve the following deficits and impairments:  Abnormal gait, Decreased activity tolerance, Decreased balance, Decreased knowledge of precautions, Decreased endurance, Decreased coordination, Decreased knowledge of use of DME, Decreased mobility, Decreased range of motion, Difficulty walking, Decreased safety awareness, Decreased strength, Impaired flexibility, Impaired perceived functional ability, Impaired tone, Postural dysfunction, Improper body mechanics,  Pain  Visit Diagnosis: Muscle weakness (generalized)  Other lack of coordination  Other abnormalities of gait and mobility  Hemiplegia and hemiparesis following cerebral infarction affecting right dominant side [facility])     Problem List Patient Active Problem List   Diagnosis Date Noted  . Acute CVA (cerebrovascular accident) ([place]) 05/02/2016  . Left-sided weakness 05/01/2016   Janna Arch, PT, DPT   12/16/2018, 2:29 PM  [place] MAIN [facility] SERVICES [address] [place], [place], [address] Phone: [phone]   Fax:  [phone]  Name: Curtis Powell MRN: [id number] Date of Birth: Dec 09, 1965

## 2018-12-16 NOTE — Therapy (Addendum)
[place] MAIN [facility] SERVICES [address] [place], [place], [address] Phone: [phone]   Fax:  [phone]  Occupational Therapy Treatment  Patient Details  Name: Curtis Powell MRN: [id number] Date of Birth: Aug 22, 1965 No data recorded  Encounter Date: 12/16/2018  OT End of Session - 12/16/18 1443    Visit Number  121    Number of Visits  137    Date for OT Re-Evaluation  02/10/19    Authorization Type  Progress reporting period starting 12/09/2018    OT Start Time  1433    OT Stop Time  1515    OT Time Calculation (min)  42 min    Activity Tolerance  Patient tolerated treatment well    Behavior During Therapy  [facility] for tasks assessed/performed       Past Medical History:  Diagnosis Date  . GERD (gastroesophageal reflux disease)   . Hyperlipidemia   . Hypertension   . Obesity   . Stroke [facility])     Past Surgical History:  Procedure Laterality Date  . BRAIN SURGERY      There were no vitals filed for this visit.  Subjective Assessment - 12/16/18 1438    Subjective   Pt. reports no additional falls.    Patient is accompanied by:  Family member    Pertinent History  Pt. is a 52 y.o. male who suffered a CVA on 05/01/2016. Pt. was admitted to the hospital. Once discharged, he received Home Health PT and OT services for about a month. Pt. has had multiple CVAs over the past 8 years, and has had multiple falls in the past 6 months. Pt. resides in an apartment. Pt. has caregivers for 80 hours. Pt.'s mother stays with pt. at night, and assists with IADL tasks.      Currently in Pain?  No/denies       OT TREATMENT    Neuro muscular re-education:  Pt. worked on using his right hand for grasping, and manipulating 1/2" washers from a magnetic dish using 2pt. point grasp pattern. Pt. worked on reaching up, stabilizing, and sustaining shoulder elevation while placing the washer over a small precise target on vertical dowels positioned at  various angles. Pt. worked on grasping large flat shapes, and moving them through 4 vertical dowels of varying heights. Pt. performed [facility] tasks using the Grooved pegboard. Pt. worked on grasping the grooved pegs from a horizontal position, and moving the pegs to a vertical position in the hand to prepare for placing them in the grooved slot. Pt. worked on translatory movements, as well as twisting/turning the pegs into position.                         OT Education - 12/16/18 1442    Education provided  Yes    Education Details  Fine motor coordination skills    Person(s) Educated  Patient    Methods  Explanation;Demonstration    Comprehension  Verbalized understanding;Returned demonstration;Verbal cues required    Person(s) Educated  Patient    Methods  Explanation    Comprehension  Verbalized understanding       OT Short Term Goals - 03/03/17 1459      OT SHORT TERM GOAL #1   Title  `        OT Long Term Goals - 12/09/18 1311      OT LONG TERM GOAL #2   Title  Pt. will complete  self grooming with minA      OT LONG TERM GOAL #4   Title  Pt. will perform self dressing with minA and A/E as needed.    Baseline  Pt. has improved with hiking pants/shorts in standing. ModA socks, and min-modA donning pants over feet. MinA donning a jacket. Indpendent with a T-shirt    Time  12    Period  Weeks    Target Date  02/10/19      OT LONG TERM GOAL #6   Title  Pt. will write his name efficiently with 100% legibility    Baseline  Pt continues to present with limited legibility and efficiency when writing name to sign in when riding the [place]    Time  12    Period  Weeks    Status  On-going    Target Date  02/10/19      OT LONG TERM GOAL #7   Title  Pt will independently and consistently follow HEP to increase UE strength to increase functional independence    Baseline  Pt to continues to work towards independence with exercises    Time  12    Period  Weeks     Status  On-going    Target Date  02/10/19      OT LONG TERM GOAL #8   Title  Pt. will require supervision ironing a shirt.    Baseline  Pt. requires CGA-minA, and complete set-up seated using a tabletop iron    Time  12    Period  Weeks    Status  On-going    Target Date  02/10/19      OT LONG TERM GOAL  #9   Baseline  Pt. will independently be able to sew a button onto a shirt.    Time  12    Period  Weeks    Status  On-going    Target Date  02/10/19      OT LONG TERM GOAL  #10   TITLE  Pt. will independently be able to throw a ball    Baseline  Pt. continues to be limited    Time  12    Period  Weeks    Target Date  02/10/19      OT LONG TERM GOAL  #11   TITLE  Pt. will improve UE functional reaching to be able to independently use his RUE to hand his clothes in the closest.    Baseline  Pt. continues to have difficulty.    Time  12    Period  Weeks    Status  On-going    Target Date  02/10/19            Plan - 12/16/18 1445    Clinical Impression Statement Pt. continues to make progress overall with BUE  functional reaching.  Pt. continues to present with limited BUE strength, and [facility] skills, as well as [facility] skills while his UEs are sustained in elevation. Pt. continues to work on improving UE functioning in order to improve overall engagement in ADL, and IADL functioning.    OT Occupational Profile and History  Problem Focused Assessment - Including review of records relating to presenting problem    Occupational performance deficits (Please refer to evaluation for details):  ADL's;IADL's    Body Structure / Function / Physical Skills  ADL;FMC;ROM;IADL;Pain;Coordination    Rehab Potential  Fair    Clinical Decision Making  Several treatment options, min-mod task modification necessary  OT Frequency  2x / week    OT Duration  12 weeks    OT Treatment/Interventions  Self-care/ADL training;Patient/family education;DME and/or AE instruction;Therapeutic exercise;Moist  Heat;Neuromuscular education    Consulted and Agree with Plan of Care  Patient       Patient will benefit from skilled therapeutic intervention in order to improve the following deficits and impairments:   Body Structure / Function / Physical Skills: ADL, FMC, ROM, IADL, Pain, Coordination       Visit Diagnosis: Muscle weakness (generalized)  Other lack of coordination    Problem List Patient Active Problem List   Diagnosis Date Noted  . Acute CVA (cerebrovascular accident) ([place]) 05/02/2016  . Left-sided weakness 05/01/2016    Harrel Carina, MS, OTR/L 12/16/2018, 3:02 PM  [place] MAIN [facility] SERVICES [address] [place], [place], [address] Phone: [phone]   Fax:  [phone]  Name: Curtis Powell MRN: [id number] Date of Birth: February 22, 1966

## 2018-12-18 ENCOUNTER — Other Ambulatory Visit: Payer: Self-pay | Admitting: Otolaryngology

## 2018-12-18 DIAGNOSIS — R1310 Dysphagia, unspecified: Secondary | ICD-10-CM

## 2018-12-21 ENCOUNTER — Encounter: Payer: Self-pay | Admitting: Occupational Therapy

## 2018-12-21 ENCOUNTER — Other Ambulatory Visit: Payer: Self-pay

## 2018-12-21 ENCOUNTER — Ambulatory Visit: Payer: Medicare HMO

## 2018-12-21 ENCOUNTER — Ambulatory Visit: Payer: Medicare HMO | Admitting: Occupational Therapy

## 2018-12-21 DIAGNOSIS — R2689 Other abnormalities of gait and mobility: Secondary | ICD-10-CM

## 2018-12-21 DIAGNOSIS — M6281 Muscle weakness (generalized): Secondary | ICD-10-CM

## 2018-12-21 DIAGNOSIS — R278 Other lack of coordination: Secondary | ICD-10-CM

## 2018-12-21 DIAGNOSIS — I69351 Hemiplegia and hemiparesis following cerebral infarction affecting right dominant side: Secondary | ICD-10-CM

## 2018-12-21 NOTE — Therapy (Signed)
[place] MAIN [facility] SERVICES [address] [place], [place], [address] Phone: [phone]   Fax:  [phone]  Physical Therapy Treatment/ RECERT  Patient Details  Name: Curtis Powell MRN: [id number] Date of Birth: 10-29-1965 No data recorded  Encounter Date: 12/21/2018  PT End of Session - 12/22/18 0854    Visit Number  136    Number of Visits  152    Date for PT Re-Evaluation  02/15/19    Authorization Type  6/10 PN 8/19    Authorization Time Period  authorized 05/12/18-11/10/18    PT Start Time  1520    PT Stop Time  1600    PT Time Calculation (min)  40 min    Equipment Utilized During Treatment  Gait belt    Activity Tolerance  Patient tolerated treatment well    Behavior During Therapy  [facility] for tasks assessed/performed       Past Medical History:  Diagnosis Date  . GERD (gastroesophageal reflux disease)   . Hyperlipidemia   . Hypertension   . Obesity   . Stroke [facility])     Past Surgical History:  Procedure Laterality Date  . BRAIN SURGERY      There were no vitals filed for this visit.  Subjective Assessment - 12/21/18 1558    Subjective  Patient reports compliance with his HEP, his birthday is tomorrow. No falls or LOB since last session.    Pertinent History   Patient is a pleasant 53 year old male who presents to physical therapy for weakness and immobility secondary to CVA.  Had a stroke in Feb 2018. He was previously fully independent, but this stroke caused severe residual deficits, mainly on the right side as well as speech, and now he is unable to walk or perform most of his ADLs on his own. Entire right side is very weak. He still has a little difficulty with speech but his swallowing is improved to baseline. His mom had to move in with him and now is his main caregiver.     Limitations  Sitting;Lifting;Standing;Walking;House hold activities    How long can you sit comfortably?  5 minutes    How long can you stand  comfortably?  5 minutes    How long can you walk comfortably?  10 ft    Patient Stated Goals  Walk without walker, walk further with walker, get some strength in back     Currently in Pain?  No/denies         Goals:  10 MWT: 61 seconds  5x STS: 53 seconds with maximum upright posture performed with no episodes of LOB.   BERG: 23/56   LEFS: 32/56  Walk 150 ft : walked 100 ft last session   Treatment:   Side stepping in // bars; cueing for bringing feet more posterior to be in alignment with pelvis for neutral mechanics and optimal muscle activation.   Soccer ball kicks for LAQ, muscle activation sequencing/timing, and spatial awareness with coordination 2 minutes  Seated cross body punches for trunk control and abdominal strengthening x 4 minutes with PT holding pads for arc of motion. Patient challenged with upright positioning with cross body movements. Added ducking portion to return to upright for last 2 minutes.  Bathroom mobility: Min A required for pulling pants/pampers back up due to limited stability with standing reach.Requires Mod A for wheelchair propulsion to align self in proper position for toilet.    Pt educated throughout session  about proper posture and technique with exercises. Improved exercise technique, movement at target joints, use of target muscles after min to mod verbal, visual, tactile cues  Discussed with patient that using the restroom occasionally during session is ok but having to use it every session is detracting from physical therapy session.  Patient 5x STS decreased in time however improved in quality of movement and ability to obtain upright position. Decreased reliance upon UE's for transfer noted. 10 MWT improving with gait speed and mechanics each retest, nearly breaking the 1 minute mark this session. Patient's BERG is improving to 32/56 with improved steadiness with eyes closed and with feet apart. Patient is progressing with ambulatory  velocity and posture however his initial contact with the forefoot of his RLE limits his stability and weight acceptance when fatigued. Patient would benefit from additional skilled PT Intervention to improve strength, balance and gait safety.                    PT Education - 12/21/18 1558    Education provided  Yes    Education Details  goals, POC, stability    Person(s) Educated  Patient    Methods  Explanation;Demonstration;Tactile cues;Verbal cues    Comprehension  Verbalized understanding;Returned demonstration;Verbal cues required;Tactile cues required       PT Short Term Goals - 12/22/18 1049      PT SHORT TERM GOAL #1   Title  Patient will be independent in home exercise program to improve strength/mobility for better functional independence with ADLs.    Baseline  hep compliant    Time  2    Period  Weeks    Status  Achieved      PT SHORT TERM GOAL #2   Title  Patient will require min cueing for STS transfer with CGA for increased independence with mobility.     Baseline  CGA    Time  2    Period  Weeks    Status  Achieved      PT SHORT TERM GOAL #3   Title  Patient will maintain upright posture for > 15 seconds to demonstrate strengthened postural control muscualture     Baseline  18 seconds     Time  2    Period  Weeks    Status  Achieved        PT Long Term Goals - 12/22/18 0001      PT LONG TERM GOAL #1   Title  Patient will perform 10 MWT in < 1 minute to demonstrate improved ambulatory velocity and capacity.     Baseline  4/10: 2 minutes 45 seconds; 5/29: 2 minute 31 seconds; 3 minutes 22 seconds 8/22: 3 minutes 2 seconds  9/12:  2 minutes and 43 seconds 10/15: 23mnute 12 seconds 11/7: 1 minute 21 seconds 12/19: 1 min 16 seconds 3/3: 57 seconds; 08/26/18: 1:41.4s= 0.10 m/s 7/1: 1 minute 14 seconds 9/14: 61 seconds    Time  8    Period  Weeks    Status  Partially Met    Target Date  02/15/19      PT LONG TERM GOAL #2   Title  Patient (<  652years old) will complete five times sit to stand test in < 10 seconds indicating an increased LE strength and improved balance.    [facility]  [facility] and one on WC, 80% upright posture 7/1: 22 seconds one hand on walker one  hand on w/c  9/14: 53 seconds with full upright posture    Time  8    Period  Weeks    Status  On-going    Target Date  02/15/19      PT LONG TERM GOAL #3   Title  Patient will increase Berg Balance score by > 6 points (26/56) to demonstrate decreased fall risk during functional activities.    Baseline  9/12: 20/56 10/15: 21/56 11/7: 20/56 12/19: 22/56; 08/26/18: 11/56 7/1: 17/56  9/14: 23/56    Time  8    Period  Weeks    Status  Partially Met    Target Date  02/15/19      PT LONG TERM GOAL #4   Title  Patient will increase BLE gross strength to 4+/5 as to improve functional strength for independent gait, increased standing tolerance and increased ADL ability.    Baseline   4-/5 RLE, 10/15: 4/5 RLE 11/7: 4/5 RLE 3/3: 4/5 7/1:  hip flexion 4/5 hip extension 2/5 hip abduction/adduciton 2/5     Time  8    Period  Weeks    Status  On-going    Target Date  02/15/19      PT LONG TERM GOAL #5   Title  Patient will increase lower extremity functional scale to >40/80 to demonstrate improved functional mobility and increased tolerance with ADLs.     Baseline  14/80; 12/26: 21/80; 2/18: 28/80  4/10; 25/80 5/29: 27/80; 7/16: 29/80 8/22: 30/80  9/12: 26/80 (feeling more tired due to moving) 10/15: 37/80 11/7: 23/80 3/3: 32/80; 08/25/18: 28/80 9/15: 32/56    Time  8    Period  Weeks    Status  Partially Met    Target Date  02/15/19      PT LONG TERM GOAL #6   Title  Patient will ambulate 150 ft with least assistive device and Supervision with no breaks to allow for increased mobility within home.    Baseline  11/7: 178f 12/19: 115 ft  3/3: will assess next session, has done 200 ft with one rest break; 08/26/18: Pt fatigued after 30' today 9/14:  100 ft with CGA    Time  8    Period  Weeks    Status  Partially Met    Target Date  02/15/19            Plan - 12/22/18 1109    Clinical Impression Statement  Patient 5x STS decreased in time however improved in quality of movement and ability to obtain upright position. Decreased reliance upon UE's for transfer noted. 10 MWT improving with gait speed and mechanics each retest, nearly breaking the 1 minute mark this session. Patient's BERG is improving to 32/56 with improved steadiness with eyes closed and with feet apart. Patient is progressing with ambulatory velocity and posture however his initial contact with the forefoot of his RLE limits his stability and weight acceptance when fatigued. Patient would benefit from additional skilled PT Intervention to improve strength, balance and gait safety.    Rehab Potential  Fair    Clinical Impairments Affecting Rehab Potential  hx of HTN, HLD, CVA, learning disability, Diabetes, brain tumor,     PT Frequency  2x / week    PT Duration  8 weeks    PT Treatment/Interventions  ADLs/Self Care Home Management;Aquatic Therapy;Ultrasound;Moist Heat;Traction;DME Instruction;Gait training;Stair training;Functional mobility training;Therapeutic activities;Therapeutic exercise;Orthotic Fit/Training;Neuromuscular re-education;Balance training;Patient/family education;Manual techniques;Wheelchair mobility training;Passive range of motion;Energy conservation;Taping;Visual/perceptual remediation/compensation  PT Next Visit Plan  progress ambulation for duration    PT Home Exercise Plan  TrA contraction, glute sets: needs updates. Potentially some time in supine flat in hospital bed, to improve hip extension deficits.     Consulted and Agree with Plan of Care  Patient       Patient will benefit from skilled therapeutic intervention in order to improve the following deficits and impairments:  Abnormal gait, Decreased activity tolerance, Decreased balance,  Decreased knowledge of precautions, Decreased endurance, Decreased coordination, Decreased knowledge of use of DME, Decreased mobility, Decreased range of motion, Difficulty walking, Decreased safety awareness, Decreased strength, Impaired flexibility, Impaired perceived functional ability, Impaired tone, Postural dysfunction, Improper body mechanics, Pain  Visit Diagnosis: Muscle weakness (generalized)  Other lack of coordination  Other abnormalities of gait and mobility  Hemiplegia and hemiparesis following cerebral infarction affecting right dominant side [facility])     Problem List Patient Active Problem List   Diagnosis Date Noted  . Acute CVA (cerebrovascular accident) ([place]) 05/02/2016  . Left-sided weakness 05/01/2016   Janna Arch, PT, DPT   12/22/2018, 11:10 AM  [place] MAIN [facility] SERVICES [address] [place], [place], [address] Phone: [phone]   Fax:  [phone]  Name: Curtis Powell MRN: [id number] Date of Birth: 08-Apr-1966

## 2018-12-21 NOTE — Therapy (Signed)
[place] MAIN [facility] SERVICES [address] [place], [place], [address] Phone: [phone]   Fax:  [phone]  Occupational Therapy Treatment  Patient Details  Name: Curtis Powell MRN: [id number] Date of Birth: [birth year] No data recorded  Encounter Date: 12/21/2018  OT End of Session - 12/21/18 1440    Visit Number  122    Number of Visits  137    Date for OT Re-Evaluation  02/10/19    Authorization Type  Progress reporting period starting 12/09/2018    OT Start Time  1430    OT Stop Time  1515    OT Time Calculation (min)  45 min    Equipment Utilized During Treatment  Long handled shoe horn    Activity Tolerance  Patient tolerated treatment well    Behavior During Therapy  WFL for tasks assessed/performed       Past Medical History:  Diagnosis Date  . GERD (gastroesophageal reflux disease)   . Hyperlipidemia   . Hypertension   . Obesity   . Stroke [facility])     Past Surgical History:  Procedure Laterality Date  . BRAIN SURGERY      There were no vitals filed for this visit.  Subjective Assessment - 12/21/18 1436    Subjective   Pt. reports his birthday is tomorrow    Patient is accompanied by:  Family member    Pertinent History  Pt. is a 53 y.o. male who suffered a CVA on 05/01/2016. Pt. was admitted to the hospital. Once discharged, he received Home Health PT and OT services for about a month. Pt. has had multiple CVAs over the past 8 years, and has had multiple falls in the past 6 months. Pt. resides in an apartment. Pt. has caregivers for 80 hours. Pt.'s mother stays with pt. at night, and assists with IADL tasks.      Currently in Pain?  No/denies       OT TREATMENT    Neuro muscular re-education:  Pt. worked on using his left hand for grasping 1/2" washers, reaching to position them onto magnetic hooks placed on a whiteboard positioned at a high vertical angle on the tabletop. Pt. worked on Administrator, arts patterns,  elevating BUEs, sustaining BUEs in elevation while connecting PCP pipe designs. Pt. required cues for matching the correct size of the connector pieces following the design pattern. Pt. had increased difficulty with the final 2 connector pieces.                          OT Education - 12/21/18 1440    Education provided  Yes    Education Details  Fine motor coordination skills    Person(s) Educated  Patient    Methods  Explanation;Demonstration    Comprehension  Verbalized understanding;Returned demonstration;Verbal cues required       OT Short Term Goals - 03/03/17 1459      OT SHORT TERM GOAL #1   Title  `        OT Long Term Goals - 12/09/18 1311      OT LONG TERM GOAL #2   Title  Pt. will complete self grooming with minA      OT LONG TERM GOAL #4   Title  Pt. will perform self dressing with minA and A/E as needed.    Baseline  Pt. has improved with hiking pants/shorts in standing. ModA socks, and min-modA donning pants over  feet. MinA donning a jacket. Indpendent with a T-shirt    Time  12    Period  Weeks    Target Date  02/10/19      OT LONG TERM GOAL #6   Title  Pt. will write his name efficiently with 100% legibility    Baseline  Pt continues to present with limited legibility and efficiency when writing name to sign in when riding the [place]    Time  12    Period  Weeks    Status  On-going    Target Date  02/10/19      OT LONG TERM GOAL #7   Title  Pt will independently and consistently follow HEP to increase UE strength to increase functional independence    Baseline  Pt to continues to work towards independence with exercises    Time  12    Period  Weeks    Status  On-going    Target Date  02/10/19      OT LONG TERM GOAL #8   Title  Pt. will require supervision ironing a shirt.    Baseline  Pt. requires CGA-minA, and complete set-up seated using a tabletop iron    Time  12    Period  Weeks    Status  On-going    Target Date  02/10/19       OT LONG TERM GOAL  #9   Baseline  Pt. will independently be able to sew a button onto a shirt.    Time  12    Period  Weeks    Status  On-going    Target Date  02/10/19      OT LONG TERM GOAL  #10   TITLE  Pt. will independently be able to throw a ball    Baseline  Pt. continues to be limited    Time  12    Period  Weeks    Target Date  02/10/19      OT LONG TERM GOAL  #11   TITLE  Pt. will improve UE functional reaching to be able to independently use his RUE to hand his clothes in the closest.    Baseline  Pt. continues to have difficulty.    Time  12    Period  Weeks    Status  On-going    Target Date  02/10/19            Plan - 12/21/18 1441    Clinical Impression Statement  Pt. is making progress, and is now able to his UEs more during daily tasks including opening medication bottles, and reaching up into the medicine cabinet. Pt. has difficulty with reaching, and manipulating objects while his UEs are elevated. Pt. continues to work on improving RUE functional reaching, [facility] skills, and translatory movements of the hand in order to improve, and increase engagement of his bilateral UE's during ADLs, and IADL tasks.    OT Occupational Profile and History  Problem Focused Assessment - Including review of records relating to presenting problem    Occupational performance deficits (Please refer to evaluation for details):  ADL's;IADL's    Body Structure / Function / Physical Skills  ADL;FMC;ROM;IADL;Pain;Coordination    Rehab Potential  Fair    Clinical Decision Making  Several treatment options, min-mod task modification necessary    OT Frequency  2x / week    OT Duration  12 weeks    OT Treatment/Interventions  Self-care/ADL training;Patient/family education;DME and/or AE instruction;Therapeutic exercise;Moist Heat;Neuromuscular  education    Consulted and Agree with Plan of Care  Patient       Patient will benefit from skilled therapeutic intervention in order to  improve the following deficits and impairments:   Body Structure / Function / Physical Skills: ADL, FMC, ROM, IADL, Pain, Coordination       Visit Diagnosis: Muscle weakness (generalized)    Problem List Patient Active Problem List   Diagnosis Date Noted  . Acute CVA (cerebrovascular accident) ([place]) 05/02/2016  . Left-sided weakness 05/01/2016    Harrel Carina, MS, OTR/L 12/21/2018, 3:02 PM  [place] MAIN [facility] SERVICES [address] [place], [place], [address] Phone: [phone]   Fax:  [phone]  Name: Curtis Powell MRN: [id number] Date of Birth: 11-24-1965

## 2018-12-23 ENCOUNTER — Encounter: Payer: Self-pay | Admitting: Occupational Therapy

## 2018-12-23 ENCOUNTER — Ambulatory Visit: Payer: Medicare HMO | Admitting: Occupational Therapy

## 2018-12-23 ENCOUNTER — Other Ambulatory Visit: Payer: Self-pay

## 2018-12-23 ENCOUNTER — Ambulatory Visit: Payer: Medicare HMO

## 2018-12-23 DIAGNOSIS — R2689 Other abnormalities of gait and mobility: Secondary | ICD-10-CM

## 2018-12-23 DIAGNOSIS — M6281 Muscle weakness (generalized): Secondary | ICD-10-CM | POA: Diagnosis not present

## 2018-12-23 DIAGNOSIS — R278 Other lack of coordination: Secondary | ICD-10-CM

## 2018-12-23 NOTE — Therapy (Signed)
[place] MAIN [facility] SERVICES [address] [place], [place], [address] Phone: [phone]   Fax:  [phone]  Physical Therapy Treatment  Patient Details  Name: Curtis Powell MRN: [id number] Date of Birth: 02-03-1966 No data recorded  Encounter Date: 12/23/2018  PT End of Session - 12/23/18 1359    Visit Number  137    Number of Visits  152    Date for PT Re-Evaluation  02/15/19    Authorization Type  7/10 PN 8/19    Authorization Time Period  authorized 05/12/18-11/10/18    PT Start Time  1350    PT Stop Time  1430    PT Time Calculation (min)  40 min    Equipment Utilized During Treatment  Gait belt    Activity Tolerance  Patient tolerated treatment well    Behavior During Therapy  [facility] for tasks assessed/performed       Past Medical History:  Diagnosis Date  . GERD (gastroesophageal reflux disease)   . Hyperlipidemia   . Hypertension   . Obesity   . Stroke [facility])     Past Surgical History:  Procedure Laterality Date  . BRAIN SURGERY      There were no vitals filed for this visit.  Subjective Assessment - 12/23/18 1359    Subjective  Patient reports compliance with his HEP. No falls or LOB since last session. Denies pain.    Pertinent History   Patient is a pleasant 53 year old male who presents to physical therapy for weakness and immobility secondary to CVA.  Had a stroke in Feb 2018. He was previously fully independent, but this stroke caused severe residual deficits, mainly on the right side as well as speech, and now he is unable to walk or perform most of his ADLs on his own. Entire right side is very weak. He still has a little difficulty with speech but his swallowing is improved to baseline. His mom had to move in with him and now is his main caregiver.     Limitations  Sitting;Lifting;Standing;Walking;House hold activities    How long can you sit comfortably?  5 minutes    How long can you stand comfortably?  5 minutes     How long can you walk comfortably?  10 ft    Patient Stated Goals  Walk without walker, walk further with walker, get some strength in back     Currently in Pain?  No/denies         TREATMENT  Ther-ex  Forward/backward gait in // bars with BUE support x 2 lengths each; Side stepping in // bars; cueing for bringing feet more posterior to be in alignment with pelvis for neutral mechanics and optimal muscle activation x 1 length each direction;. Seated marches with cues for increased march height x 15 bilateral; Seated clams with manual resistance x 15; Seated adductor squeeze with manual resistance x 15; Seated LAQ with manual resistance x 15; Seated heel toe raises 20x Soccer ball kicks for LAQ, muscle activation sequencing/timing, and spatial awareness with coordination 2 minutes Sit to stand5x withmodcueing for sequencing/gluteal squeeze,CGAto reduce "plopping" of poor eccentric control; 10 second holds; tactile assistance and verbal cueing for forward pelvic control for upright posture Ambulate with RW and CGA50 ft;Frequent cues for upright posture and to increase hip flexion to improve toe clearance during swing, w/c followverbal cueing for upright posture;   Pt educated throughout session about proper posture and technique with exercises. Improved exercise  technique, movement at target joints, use of target muscles after min to mod verbal, visual, tactile cues   Patient continues to demonstrate significant imbalance in standing with inability today to remain standing in // bars without significant UE support. He also fatigues quickly during ambulation in parallel bars. During ambulation he requires repeated cues for upright posture and improved hip flexion/heel strike.  Encouraged pt to continue HEP and follow-up as scheduled. Patient would benefit from additional skilled PT Intervention to improve strength, balance and gait  safety.                       PT Short Term Goals - 12/22/18 1049      PT SHORT TERM GOAL #1   Title  Patient will be independent in home exercise program to improve strength/mobility for better functional independence with ADLs.    Baseline  hep compliant    Time  2    Period  Weeks    Status  Achieved      PT SHORT TERM GOAL #2   Title  Patient will require min cueing for STS transfer with CGA for increased independence with mobility.     Baseline  CGA    Time  2    Period  Weeks    Status  Achieved      PT SHORT TERM GOAL #3   Title  Patient will maintain upright posture for > 15 seconds to demonstrate strengthened postural control muscualture     Baseline  18 seconds     Time  2    Period  Weeks    Status  Achieved        PT Long Term Goals - 12/22/18 0001      PT LONG TERM GOAL #1   Title  Patient will perform 10 MWT in < 1 minute to demonstrate improved ambulatory velocity and capacity.     Baseline  4/10: 2 minutes 45 seconds; 5/29: 2 minute 31 seconds; 3 minutes 22 seconds 8/22: 3 minutes 2 seconds  9/12:  2 minutes and 43 seconds 10/15: 69mnute 12 seconds 11/7: 1 minute 21 seconds 12/19: 1 min 16 seconds 3/3: 57 seconds; 08/26/18: 1:41.4s= 0.10 m/s 7/1: 1 minute 14 seconds 9/14: 61 seconds    Time  8    Period  Weeks    Status  Partially Met    Target Date  02/15/19      PT LONG TERM GOAL #2   Title  Patient (< 665years old) will complete five times sit to stand test in < 10 seconds indicating an increased LE strength and improved balance.    [facility]  [facility] and one on [facility], 80% upright posture 7/1: 22 seconds one hand on walker one hand on w/c  9/14: 53 seconds with full upright posture    Time  8    Period  Weeks    Status  On-going    Target Date  02/15/19      PT LONG TERM GOAL #3   Title  Patient will increase Berg Balance score by > 6 points (26/56) to demonstrate decreased fall risk during functional  activities.    Baseline  9/12: 20/56 10/15: 21/56 11/7: 20/56 12/19: 22/56; 08/26/18: 11/56 7/1: 17/56  9/14: 23/56    Time  8    Period  Weeks    Status  Partially Met    Target Date  02/15/19  PT LONG TERM GOAL #4   Title  Patient will increase BLE gross strength to 4+/5 as to improve functional strength for independent gait, increased standing tolerance and increased ADL ability.    Baseline   4-/5 RLE, 10/15: 4/5 RLE 11/7: 4/5 RLE 3/3: 4/5 7/1:  hip flexion 4/5 hip extension 2/5 hip abduction/adduciton 2/5     Time  8    Period  Weeks    Status  On-going    Target Date  02/15/19      PT LONG TERM GOAL #5   Title  Patient will increase lower extremity functional scale to >40/80 to demonstrate improved functional mobility and increased tolerance with ADLs.     Baseline  14/80; 12/26: 21/80; 2/18: 28/80  4/10; 25/80 5/29: 27/80; 7/16: 29/80 8/22: 30/80  9/12: 26/80 (feeling more tired due to moving) 10/15: 37/80 11/7: 23/80 3/3: 32/80; 08/25/18: 28/80 9/15: 32/56    Time  8    Period  Weeks    Status  Partially Met    Target Date  02/15/19      PT LONG TERM GOAL #6   Title  Patient will ambulate 150 ft with least assistive device and Supervision with no breaks to allow for increased mobility within home.    Baseline  11/7: 122f 12/19: 115 ft  3/3: will assess next session, has done 200 ft with one rest break; 08/26/18: Pt fatigued after 30' today 9/14: 100 ft with CGA    Time  8    Period  Weeks    Status  Partially Met    Target Date  02/15/19            Plan - 12/23/18 1402    Clinical Impression Statement  Patient continues to demonstrate significant imbalance in standing with inability today to remain standing in // bars without significant UE support. He also fatigues quickly during ambulation in parallel bars. During ambulation he requires repeated cues for upright posture and improved hip flexion/heel strike.  Encouraged pt to continue HEP and follow-up as scheduled.  Patient would benefit from additional skilled PT Intervention to improve strength, balance and gait safety.    Rehab Potential  Fair    Clinical Impairments Affecting Rehab Potential  hx of HTN, HLD, CVA, learning disability, Diabetes, brain tumor,     PT Frequency  2x / week    PT Duration  8 weeks    PT Treatment/Interventions  ADLs/Self Care Home Management;Aquatic Therapy;Ultrasound;Moist Heat;Traction;DME Instruction;Gait training;Stair training;Functional mobility training;Therapeutic activities;Therapeutic exercise;Orthotic Fit/Training;Neuromuscular re-education;Balance training;Patient/family education;Manual techniques;Wheelchair mobility training;Passive range of motion;Energy conservation;Taping;Visual/perceptual remediation/compensation    PT Next Visit Plan  progress ambulation for duration    PT Home Exercise Plan  TrA contraction, glute sets: needs updates. Potentially some time in supine flat in hospital bed, to improve hip extension deficits.     Consulted and Agree with Plan of Care  Patient       Patient will benefit from skilled therapeutic intervention in order to improve the following deficits and impairments:  Abnormal gait, Decreased activity tolerance, Decreased balance, Decreased knowledge of precautions, Decreased endurance, Decreased coordination, Decreased knowledge of use of DME, Decreased mobility, Decreased range of motion, Difficulty walking, Decreased safety awareness, Decreased strength, Impaired flexibility, Impaired perceived functional ability, Impaired tone, Postural dysfunction, Improper body mechanics, Pain  Visit Diagnosis: Muscle weakness (generalized)  Other abnormalities of gait and mobility  Other lack of coordination     Problem List Patient Active Problem List  Diagnosis Date Noted  . Acute CVA (cerebrovascular accident) ([place]) 05/02/2016  . Left-sided weakness 05/01/2016     This entire session was performed under direct supervision  and direction of a licensed therapist/therapist assistant . I have personally read, edited and approve of the note as written.    Phillips Grout PT, DPT, GCS  Huprich,Jason 12/23/2018, 5:30 PM  [place] MAIN [facility] SERVICES [address] [place], [place], [address] Phone: [phone]   Fax:  [phone]  Name: EULAS SCHWEITZER MRN: [id number] Date of Birth: 1965/12/14

## 2018-12-23 NOTE — Therapy (Signed)
[place] MAIN [facility] SERVICES [address] [place], [place], [address] Phone: [phone]   Fax:  [phone]  Occupational Therapy Treatment  Patient Details  Name: Curtis Powell MRN: [id number] Date of Birth: 09-Feb-1966 No data recorded  Encounter Date: 12/23/2018  OT End of Session - 12/23/18 1452    Visit Number  123    Number of Visits  137    Date for OT Re-Evaluation  02/10/19    OT Start Time  1432    OT Stop Time  1515    OT Time Calculation (min)  43 min    Activity Tolerance  Patient tolerated treatment well    Behavior During Therapy  [facility] for tasks assessed/performed       Past Medical History:  Diagnosis Date  . GERD (gastroesophageal reflux disease)   . Hyperlipidemia   . Hypertension   . Obesity   . Stroke [facility])     Past Surgical History:  Procedure Laterality Date  . BRAIN SURGERY      There were no vitals filed for this visit.  Subjective Assessment - 12/23/18 1449    Subjective   Pt. reports his [birth year] is [birth year]    Patient is accompanied by:  Family member    Pertinent History  Pt. is a 53 y.o. male who suffered a CVA on 05/01/2016. Pt. was admitted to the hospital. Once discharged, he received Home Health PT and OT services for about a month. Pt. has had multiple CVAs over the past 8 years, and has had multiple falls in the past 6 months. Pt. resides in an apartment. Pt. has caregivers for 80 hours. Pt.'s mother stays with pt. at night, and assists with IADL tasks.      Currently in Pain?  No/denies      OT TREATMENT    Neuro muscular re-education:  Pt. performed right hand [facility] skills training to improve speed and dexterity needed for ADL tasks and writing. Pt. demonstrated grasping 1 inch sticks,  inch cylindrical collars, and  inch flat washers on the [employer] pegboard. Pt. worked on Scientist, product/process development item with his 2nd digit and thumb, storing them in the palm, and translatory movements moving the objects  through his hand into position to prepare to place them onto the Pegboard. Pt. was able to store the small objects in his right hand, however had difficulty with translatory movements of the hand.  Selfcare:  Pt. worked on toileting skills in standing with CGA for clothing negotiation. Pt. is independent with set-up for hand hygiene. Pt. was assisted with problem solving through accessing doorways, handling doorways to the bathroom with the w/c. Pt. has difficulty manuevering through the doorways with elevated thresholds from the w/c, requiring him to back the w/c up in reverse position to move through the doorway. Pt. reports that he is not able to make the turn with the w/c into public bathrooms designed with stalls.  Response to Treatment:   Pt. reports that his RUE is sore due to having had 2 shots in his right shoulder yesterday limiting his ability to reach through multiple planes duirng ADLs, and IADLs. Pt. continues to work on improving RUE strength, and [facility] skills in order to improve overall ADL, and IADL functioning.                        OT Education - 12/23/18 1452    Education provided  Yes  Education Details  Fine motor coordination skills    Person(s) Educated  Patient    Methods  Explanation;Demonstration    Comprehension  Verbalized understanding;Returned demonstration;Verbal cues required       OT Short Term Goals - 03/03/17 1459      OT SHORT TERM GOAL #1   Title  `        OT Long Term Goals - 12/09/18 1311      OT LONG TERM GOAL #2   Title  Pt. will complete self grooming with minA      OT LONG TERM GOAL #4   Title  Pt. will perform self dressing with minA and A/E as needed.    Baseline  Pt. has improved with hiking pants/shorts in standing. ModA socks, and min-modA donning pants over feet. MinA donning a jacket. Indpendent with a T-shirt    Time  12    Period  Weeks    Target Date  02/10/19      OT LONG TERM GOAL #6   Title  Pt.  will write his name efficiently with 100% legibility    Baseline  Pt continues to present with limited legibility and efficiency when writing name to sign in when riding the [place]    Time  12    Period  Weeks    Status  On-going    Target Date  02/10/19      OT LONG TERM GOAL #7   Title  Pt will independently and consistently follow HEP to increase UE strength to increase functional independence    Baseline  Pt to continues to work towards independence with exercises    Time  12    Period  Weeks    Status  On-going    Target Date  02/10/19      OT LONG TERM GOAL #8   Title  Pt. will require supervision ironing a shirt.    Baseline  Pt. requires CGA-minA, and complete set-up seated using a tabletop iron    Time  12    Period  Weeks    Status  On-going    Target Date  02/10/19      OT LONG TERM GOAL  #9   Baseline  Pt. will independently be able to sew a button onto a shirt.    Time  12    Period  Weeks    Status  On-going    Target Date  02/10/19      OT LONG TERM GOAL  #10   TITLE  Pt. will independently be able to throw a ball    Baseline  Pt. continues to be limited    Time  12    Period  Weeks    Target Date  02/10/19      OT LONG TERM GOAL  #11   TITLE  Pt. will improve UE functional reaching to be able to independently use his RUE to hand his clothes in the closest.    Baseline  Pt. continues to have difficulty.    Time  12    Period  Weeks    Status  On-going    Target Date  02/10/19            Plan - 12/23/18 1452    Clinical Impression Statement  Pt. reports that his RUE is sore due to having had 2 shots in his right shoulder yesterday limiting his ability to reach through multiple planes duirng ADLs, and IADLs. Pt. continues to  work on improving RUE strength, and [facility] skills in order to improve overall ADL, and IADL functioning.    OT Occupational Profile and History  Problem Focused Assessment - Including review of records relating to presenting problem     Occupational performance deficits (Please refer to evaluation for details):  ADL's;IADL's    Body Structure / Function / Physical Skills  ADL;FMC;ROM;IADL;Pain;Coordination    Rehab Potential  Fair    Clinical Decision Making  Several treatment options, min-mod task modification necessary    OT Frequency  2x / week    OT Duration  12 weeks    OT Treatment/Interventions  Self-care/ADL training;Patient/family education;DME and/or AE instruction;Therapeutic exercise;Moist Heat;Neuromuscular education    Consulted and Agree with Plan of Care  Patient       Patient will benefit from skilled therapeutic intervention in order to improve the following deficits and impairments:   Body Structure / Function / Physical Skills: ADL, FMC, ROM, IADL, Pain, Coordination       Visit Diagnosis: Muscle weakness (generalized)  Other lack of coordination    Problem List Patient Active Problem List   Diagnosis Date Noted  . Acute CVA (cerebrovascular accident) ([place]) 05/02/2016  . Left-sided weakness 05/01/2016    Harrel Carina, MS, OTR/L 12/23/2018, 5:41 PM  [place] MAIN [facility] SERVICES [address] [place], [place], [address] Phone: [phone]   Fax:  [phone]  Name: WEIKKO LANTZY MRN: [id number] Date of Birth: 17-Apr-1965

## 2018-12-28 ENCOUNTER — Encounter: Payer: Self-pay | Admitting: Occupational Therapy

## 2018-12-28 ENCOUNTER — Ambulatory Visit: Payer: Medicare HMO | Admitting: Occupational Therapy

## 2018-12-28 ENCOUNTER — Ambulatory Visit: Payer: Medicare HMO

## 2018-12-28 ENCOUNTER — Other Ambulatory Visit: Payer: Self-pay

## 2018-12-28 DIAGNOSIS — M6281 Muscle weakness (generalized): Secondary | ICD-10-CM

## 2018-12-28 DIAGNOSIS — R2689 Other abnormalities of gait and mobility: Secondary | ICD-10-CM

## 2018-12-28 DIAGNOSIS — R278 Other lack of coordination: Secondary | ICD-10-CM

## 2018-12-28 NOTE — Therapy (Signed)
[place] MAIN [facility] SERVICES [address] [place], [place], [address] Phone: [phone]   Fax:  [phone]  Occupational Therapy Treatment  Patient Details  Name: Curtis Powell MRN: [id number] Date of Birth: [birth year] No data recorded  Encounter Date: 12/28/2018  OT End of Session - 12/28/18 1409    Visit Number  [id number]    Number of Visits  137    Date for OT Re-Evaluation  02/10/19    Authorization Type  Progress reporting period starting 12/09/2018    OT Start Time  1400    OT Stop Time  1430    OT Time Calculation (min)  30 min    Equipment Utilized During Treatment  Long handled shoe horn    Activity Tolerance  Patient tolerated treatment well    Behavior During Therapy  WFL for tasks assessed/performed       Past Medical History:  Diagnosis Date  . GERD (gastroesophageal reflux disease)   . Hyperlipidemia   . Hypertension   . Obesity   . Stroke [facility])     Past Surgical History:  Procedure Laterality Date  . BRAIN SURGERY      There were no vitals filed for this visit.  Subjective Assessment - 12/28/18 1407    Subjective   Pt. reports that he had a good weekend    Patient is accompanied by:  Family member    Pertinent History  Pt. is a 53 y.o. male who suffered a CVA on 05/01/2016. Pt. was admitted to the hospital. Once discharged, he received Home Health PT and OT services for about a month. Pt. has had multiple CVAs over the past 8 years, and has had multiple falls in the past 6 months. Pt. resides in an apartment. Pt. has caregivers for 80 hours. Pt.'s mother stays with pt. at night, and assists with IADL tasks.      Patient Stated Goals  To be able to throw a ball and dribble a ball, do as much as I can for myself.     Currently in Pain?  No/denies       OT TREATMENT    Neuro muscular re-education:  Pt. worked on reaching using the shape tower. Pt. grasped and moved the shapes through 3 vertical dowels of varying  heights. Pt.  focused on improving [facility] with manipulating nuts and bolts positioned vertically. Pt. was able to unscrew both the 1" and 1/2" nuts positioned on a vertical dowel. Pt. dropped the large, top nut when disconnecting it. Pt. worked on picking up the nuts from the table and placing them back onto the bolt. Pt. had more difficulty grasping and placing the nuts into position and alignment. Pt. required increased time and cues for hand control during the task, and keeping UEs elevated while performing [facility] skills. No hiking of the shoulders while keeping the BUEs elevated.   Response to Treatment  Pt. presents with increased compensation proximally leaning  to the left with the trunk, and hiking hisright shoulder when reaching with the right UE. Pt. requires rest breaks when reaching, however has no reports of pain. Pt. continues to work on improving BUE functional reaching, and sustaining [facility] skills when reaching in preparation for sustained functional reaching during ADLs, and IADLs.                      OT Education - 12/28/18 1408    Education provided  Yes    Education  Details  UE reaching, Fine motor coordination skills    Person(s) Educated  Patient    Methods  Explanation;Demonstration    Comprehension  Verbalized understanding;Returned demonstration;Verbal cues required       OT Short Term Goals - 03/03/17 1459      OT SHORT TERM GOAL #1   Title  `        OT Long Term Goals - 12/09/18 1311      OT LONG TERM GOAL #2   Title  Pt. will complete self grooming with minA      OT LONG TERM GOAL #4   Title  Pt. will perform self dressing with minA and A/E as needed.    Baseline  Pt. has improved with hiking pants/shorts in standing. ModA socks, and min-modA donning pants over feet. MinA donning a jacket. Indpendent with a T-shirt    Time  12    Period  Weeks    Target Date  02/10/19      OT LONG TERM GOAL #6   Title  Pt. will write his name efficiently  with 100% legibility    Baseline  Pt continues to present with limited legibility and efficiency when writing name to sign in when riding the [place]    Time  12    Period  Weeks    Status  On-going    Target Date  02/10/19      OT LONG TERM GOAL #7   Title  Pt will independently and consistently follow HEP to increase UE strength to increase functional independence    Baseline  Pt to continues to work towards independence with exercises    Time  12    Period  Weeks    Status  On-going    Target Date  02/10/19      OT LONG TERM GOAL #8   Title  Pt. will require supervision ironing a shirt.    Baseline  Pt. requires CGA-minA, and complete set-up seated using a tabletop iron    Time  12    Period  Weeks    Status  On-going    Target Date  02/10/19      OT LONG TERM GOAL  #9   Baseline  Pt. will independently be able to sew a button onto a shirt.    Time  12    Period  Weeks    Status  On-going    Target Date  02/10/19      OT LONG TERM GOAL  #10   TITLE  Pt. will independently be able to throw a ball    Baseline  Pt. continues to be limited    Time  12    Period  Weeks    Target Date  02/10/19      OT LONG TERM GOAL  #11   TITLE  Pt. will improve UE functional reaching to be able to independently use his RUE to hand his clothes in the closest.    Baseline  Pt. continues to have difficulty.    Time  12    Period  Weeks    Status  On-going    Target Date  02/10/19            Plan - 12/28/18 1412    Clinical Impression Statement  Pt. presents with increased compensation proximally leaning  to the left with the trunk, and hiking hisright shoulder when reaching with the right. Pt. requires rest breaks when reaching, however has no  reports of pain. Pt. continues to work on improving BUE functional reaching, and sustaining [facility] skills when reaching in preparation for sustained functional reaching during ADLs, and IADLs.    OT Occupational Profile and History  Problem Focused  Assessment - Including review of records relating to presenting problem    Occupational performance deficits (Please refer to evaluation for details):  ADL's;IADL's    Body Structure / Function / Physical Skills  ADL;FMC;ROM;IADL;Pain;Coordination    Rehab Potential  Fair    Clinical Decision Making  Several treatment options, min-mod task modification necessary    OT Frequency  2x / week    OT Duration  12 weeks    OT Treatment/Interventions  Self-care/ADL training;Patient/family education;DME and/or AE instruction;Therapeutic exercise;Moist Heat;Neuromuscular education    Consulted and Agree with Plan of Care  Patient       Patient will benefit from skilled therapeutic intervention in order to improve the following deficits and impairments:   Body Structure / Function / Physical Skills: ADL, FMC, ROM, IADL, Pain, Coordination       Visit Diagnosis: Muscle weakness (generalized)  Other lack of coordination    Problem List Patient Active Problem List   Diagnosis Date Noted  . Acute CVA (cerebrovascular accident) ([place]) 05/02/2016  . Left-sided weakness 05/01/2016    Harrel Carina, MS, OTR/L 12/28/2018, 2:23 PM  [place] MAIN [facility] SERVICES [address] [place], [place], [address] Phone: [phone]   Fax:  [phone]  Name: MISHAEL MASSENA MRN: [id number] Date of Birth: 14-May-1965

## 2018-12-28 NOTE — Therapy (Signed)
[place] MAIN [facility] SERVICES [address] [place], [place], [address] Phone: [phone]   Fax:  [phone]  Physical Therapy Treatment  Patient Details  Name: Curtis Powell MRN: [id number] Date of Birth: 1965/10/06 No data recorded  Encounter Date: 12/28/2018  PT End of Session - 12/28/18 1428    Visit Number  138    Number of Visits  152    Date for PT Re-Evaluation  02/15/19    Authorization Type  8/10 PN 8/19    Authorization Time Period  authorized 05/12/18-11/10/18    PT Start Time  3976    PT Stop Time  1514    PT Time Calculation (min)  42 min    Equipment Utilized During Treatment  Gait belt    Activity Tolerance  Patient tolerated treatment well    Behavior During Therapy  WFL for tasks assessed/performed       Past Medical History:  Diagnosis Date  . GERD (gastroesophageal reflux disease)   . Hyperlipidemia   . Hypertension   . Obesity   . Stroke [facility])     Past Surgical History:  Procedure Laterality Date  . BRAIN SURGERY      There were no vitals filed for this visit.  Subjective Assessment - 12/28/18 1437    Subjective  Patient reports having a good weekend, celebrated his birthday. Forgot his knee brace today. Is using a new transportation company.    Pertinent History   Patient is a pleasant 53 year old male who presents to physical therapy for weakness and immobility secondary to CVA.  Had a stroke in Feb 2018. He was previously fully independent, but this stroke caused severe residual deficits, mainly on the right side as well as speech, and now he is unable to walk or perform most of his ADLs on his own. Entire right side is very weak. He still has a little difficulty with speech but his swallowing is improved to baseline. His mom had to move in with him and now is his main caregiver.     Limitations  Sitting;Lifting;Standing;Walking;House hold activities    How long can you sit comfortably?  5 minutes    How  long can you stand comfortably?  5 minutes    How long can you walk comfortably?  10 ft    Patient Stated Goals  Walk without walker, walk further with walker, get some strength in back     Currently in Pain?  No/denies             Ther-ex  Sit to stand 5x with mod cueing for sequencing/gluteal squeeze, CGA to reduce "plopping" of poor eccentric control; 10 second holds; tactile assistance and verbal cueing for forward pelvic control for upright posture    Standing  -static upright posture with mirror in front of patient 60 seconds.    Backwards ambulation in // bars with cueing for bigger step length, upright posture, and hand positioning 2x length of // bars. Cueing to PSIS/ASIS for optimal muscle recruitment and positioning   Side stepping in // bars 2x length of // bars, CGA  Step over and back theraband on floor 10x each LE, BUE support. Very challenging to RLE to perform heel strike   Green pad under LLE to promote weight shift onto RLE. Requires use of x2 person assist, one for weight shift /pelvic control/CGA other for foot placement for heel weightacceptance and knee control to reduce hyperextension 2 minutes x 2  Seated:   Seated adduction squeezes 15x 3 second holds     seated upright posture marches for core activation with arms crossed against chest x 15 each LE  Seated adduction ball squeezes 3 second holds 15x    -IR/ER 20x each LE upright posture  -LAQ 3 second holds 15x   Seated heel toe raises 20x     Seated cross body punches for trunk control and abdominal strengthening x 4 minutes with PT holding pads for arc of motion. Patient challenged with upright positioning with cross body movements. Added ducking portion to return to upright for last 2 minutes.    Seated cross body elbow to pad x 2 minutes for core strengthening and trunk control.   Bathroom mobility: Min A required for pulling pants/pampers back up due to limited stability with standing  reach.      Pt educated throughout session about proper posture and technique with exercises. Improved exercise technique, movement at target joints, use of target muscles after min to mod verbal, visual, tactile cues   Discussed with patient that using the restroom occasionally during session is ok but having to use it every session is detracting from physical therapy session.                   PT Education - 12/28/18 1427    Education provided  Yes    Education Details  exercise technique, stability    Person(s) Educated  Patient    Methods  Explanation;Demonstration;Tactile cues;Verbal cues    Comprehension  Verbalized understanding;Returned demonstration;Verbal cues required;Tactile cues required       PT Short Term Goals - 12/22/18 1049      PT SHORT TERM GOAL #1   Title  Patient will be independent in home exercise program to improve strength/mobility for better functional independence with ADLs.    Baseline  hep compliant    Time  2    Period  Weeks    Status  Achieved      PT SHORT TERM GOAL #2   Title  Patient will require min cueing for STS transfer with CGA for increased independence with mobility.     Baseline  CGA    Time  2    Period  Weeks    Status  Achieved      PT SHORT TERM GOAL #3   Title  Patient will maintain upright posture for > 15 seconds to demonstrate strengthened postural control muscualture     Baseline  18 seconds     Time  2    Period  Weeks    Status  Achieved        PT Long Term Goals - 12/22/18 0001      PT LONG TERM GOAL #1   Title  Patient will perform 10 MWT in < 1 minute to demonstrate improved ambulatory velocity and capacity.     Baseline  4/10: 2 minutes 45 seconds; 5/29: 2 minute 31 seconds; 3 minutes 22 seconds 8/22: 3 minutes 2 seconds  9/12:  2 minutes and 43 seconds 10/15: 79mnute 12 seconds 11/7: 1 minute 21 seconds 12/19: 1 min 16 seconds 3/3: 57 seconds; 08/26/18: 1:41.4s= 0.10 m/s 7/1: 1 minute 14 seconds  9/14: 61 seconds    Time  8    Period  Weeks    Status  Partially Met    Target Date  02/15/19      PT LONG TERM GOAL #2   Title  Patient (<  52 years old) will complete five times sit to stand test in < 10 seconds indicating an increased LE strength and improved balance.    Baseline  5/19: 20.6s from [facility], one hand on walker and one on WC, 80% upright posture 7/1: 22 seconds one hand on walker one hand on w/c  9/14: 53 seconds with full upright posture    Time  8    Period  Weeks    Status  On-going    Target Date  02/15/19      PT LONG TERM GOAL #3   Title  Patient will increase Berg Balance score by > 6 points (26/56) to demonstrate decreased fall risk during functional activities.    Baseline  9/12: 20/56 10/15: 21/56 11/7: 20/56 12/19: 22/56; 08/26/18: 11/56 7/1: 17/56  9/14: 23/56    Time  8    Period  Weeks    Status  Partially Met    Target Date  02/15/19      PT LONG TERM GOAL #4   Title  Patient will increase BLE gross strength to 4+/5 as to improve functional strength for independent gait, increased standing tolerance and increased ADL ability.    Baseline   4-/5 RLE, 10/15: 4/5 RLE 11/7: 4/5 RLE 3/3: 4/5 7/1:  hip flexion 4/5 hip extension 2/5 hip abduction/adduciton 2/5     Time  8    Period  Weeks    Status  On-going    Target Date  02/15/19      PT LONG TERM GOAL #5   Title  Patient will increase lower extremity functional scale to >40/80 to demonstrate improved functional mobility and increased tolerance with ADLs.     Baseline  14/80; 12/26: 21/80; 2/18: 28/80  4/10; 25/80 5/29: 27/80; 7/16: 29/80 8/22: 30/80  9/12: 26/80 (feeling more tired due to moving) 10/15: 37/80 11/7: 23/80 3/3: 32/80; 08/25/18: 28/80 9/15: 32/56    Time  8    Period  Weeks    Status  Partially Met    Target Date  02/15/19      PT LONG TERM GOAL #6   Title  Patient will ambulate 150 ft with least assistive device and Supervision with no breaks to allow for increased mobility within home.     Baseline  11/7: 177f 12/19: 115 ft  3/3: will assess next session, has done 200 ft with one rest break; 08/26/18: Pt fatigued after 30' today 9/14: 100 ft with CGA    Time  8    Period  Weeks    Status  Partially Met    Target Date  02/15/19            Plan - 12/28/18 1507    Clinical Impression Statement  Patient presents with good motivation to physical therapy. Is fatigued easily by posterior chain muscle recruitment.  Patient demonstrated improved ability to weight accept through heel with assistance x 2 person for weight acceptance positioning of pelvis and foot position. Patient would benefit from additional skilled PT Intervention to improve strength, balance and gait safety.    Rehab Potential  Fair    Clinical Impairments Affecting Rehab Potential  hx of HTN, HLD, CVA, learning disability, Diabetes, brain tumor,     PT Frequency  2x / week    PT Duration  8 weeks    PT Treatment/Interventions  ADLs/Self Care Home Management;Aquatic Therapy;Ultrasound;Moist Heat;Traction;DME Instruction;Gait training;Stair training;Functional mobility training;Therapeutic activities;Therapeutic exercise;Orthotic Fit/Training;Neuromuscular re-education;Balance training;Patient/family education;Manual techniques;Wheelchair mobility training;Passive range of  motion;Energy conservation;Taping;Visual/perceptual remediation/compensation    PT Next Visit Plan  progress ambulation for duration    PT Home Exercise Plan  TrA contraction, glute sets: needs updates. Potentially some time in supine flat in hospital bed, to improve hip extension deficits.     Consulted and Agree with Plan of Care  Patient       Patient will benefit from skilled therapeutic intervention in order to improve the following deficits and impairments:  Abnormal gait, Decreased activity tolerance, Decreased balance, Decreased knowledge of precautions, Decreased endurance, Decreased coordination, Decreased knowledge of use of DME,  Decreased mobility, Decreased range of motion, Difficulty walking, Decreased safety awareness, Decreased strength, Impaired flexibility, Impaired perceived functional ability, Impaired tone, Postural dysfunction, Improper body mechanics, Pain  Visit Diagnosis: Muscle weakness (generalized)  Other lack of coordination  Other abnormalities of gait and mobility     Problem List Patient Active Problem List   Diagnosis Date Noted  . Acute CVA (cerebrovascular accident) ([place]) 05/02/2016  . Left-sided weakness 05/01/2016   Janna Arch, PT, DPT   12/28/2018, 3:16 PM  [place] MAIN [facility] SERVICES [address] [place], [place], [address] Phone: [phone]   Fax:  [phone]  Name: Curtis Powell MRN: [id number] Date of Birth: [birth year]

## 2018-12-29 ENCOUNTER — Other Ambulatory Visit: Payer: Self-pay | Admitting: Otolaryngology

## 2018-12-29 ENCOUNTER — Other Ambulatory Visit: Payer: Self-pay

## 2018-12-29 ENCOUNTER — Ambulatory Visit
Admission: RE | Admit: 2018-12-29 | Discharge: 2018-12-29 | Disposition: A | Discharge status: Home / Self Care | Payer: Medicare HMO | Source: Ambulatory Visit | Attending: Internal Medicine | Admitting: Internal Medicine

## 2018-12-29 ENCOUNTER — Ambulatory Visit
Admission: RE | Admit: 2018-12-29 | Discharge: 2018-12-29 | Disposition: A | Discharge status: Home / Self Care | Payer: Medicare HMO | Source: Ambulatory Visit | Attending: Otolaryngology | Admitting: Otolaryngology

## 2018-12-29 DIAGNOSIS — R1310 Dysphagia, unspecified: Secondary | ICD-10-CM

## 2018-12-29 DIAGNOSIS — I10 Essential (primary) hypertension: Secondary | ICD-10-CM | POA: Diagnosis not present

## 2018-12-29 DIAGNOSIS — I352 Nonrheumatic aortic (valve) stenosis with insufficiency: Secondary | ICD-10-CM | POA: Insufficient documentation

## 2018-12-29 DIAGNOSIS — I350 Nonrheumatic aortic (valve) stenosis: Secondary | ICD-10-CM

## 2018-12-29 DIAGNOSIS — E785 Hyperlipidemia, unspecified: Secondary | ICD-10-CM | POA: Diagnosis not present

## 2018-12-29 DIAGNOSIS — R0600 Dyspnea, unspecified: Secondary | ICD-10-CM

## 2018-12-29 DIAGNOSIS — R0609 Other forms of dyspnea: Secondary | ICD-10-CM

## 2018-12-29 NOTE — Progress Notes (Signed)
*  PRELIMINARY RESULTS* Echocardiogram 2D Echocardiogram has been performed.  Curtis Powell 12/29/2018, 10:39 AM

## 2018-12-30 ENCOUNTER — Ambulatory Visit: Payer: Medicare HMO | Admitting: Occupational Therapy

## 2018-12-30 ENCOUNTER — Encounter: Payer: Self-pay | Admitting: Occupational Therapy

## 2018-12-30 ENCOUNTER — Ambulatory Visit: Payer: Medicare HMO

## 2018-12-30 DIAGNOSIS — R2689 Other abnormalities of gait and mobility: Secondary | ICD-10-CM

## 2018-12-30 DIAGNOSIS — M6281 Muscle weakness (generalized): Secondary | ICD-10-CM | POA: Diagnosis not present

## 2018-12-30 DIAGNOSIS — R278 Other lack of coordination: Secondary | ICD-10-CM

## 2018-12-30 NOTE — Therapy (Signed)
[place] MAIN [facility] SERVICES [address] [place], [place], [address] Phone: [phone]   Fax:  [phone]  Physical Therapy Treatment  Patient Details  Name: Curtis Powell MRN: [id number] Date of Birth: 1965-07-05 No data recorded  Encounter Date: 12/30/2018  PT End of Session - 12/30/18 1733    Visit Number  139    Number of Visits  152    Date for PT Re-Evaluation  02/15/19    Authorization Type  9/10 PN 8/19    Authorization Time Period  authorized 11/16/18-05/19/19    PT Start Time  1350    PT Stop Time  1430    PT Time Calculation (min)  40 min    Equipment Utilized During Treatment  Gait belt    Activity Tolerance  Patient tolerated treatment well    Behavior During Therapy  [facility] for tasks assessed/performed       Past Medical History:  Diagnosis Date  . GERD (gastroesophageal reflux disease)   . Hyperlipidemia   . Hypertension   . Obesity   . Stroke [facility])     Past Surgical History:  Procedure Laterality Date  . BRAIN SURGERY      There were no vitals filed for this visit.  Subjective Assessment - 12/30/18 1400    Subjective  Pt reports he is doing well today.  He reports no soreness or pain today.  He endorses no falls since last visit.  No new questions or concerns at this time.    Pertinent History   Patient is a pleasant 53 year old male who presents to physical therapy for weakness and immobility secondary to CVA.  Had a stroke in Feb 2018. He was previously fully independent, but this stroke caused severe residual deficits, mainly on the right side as well as speech, and now he is unable to walk or perform most of his ADLs on his own. Entire right side is very weak. He still has a little difficulty with speech but his swallowing is improved to baseline. His mom had to move in with him and now is his main caregiver.     Limitations  Sitting;Lifting;Standing;Walking;House hold activities    How long can you sit  comfortably?  5 minutes    How long can you stand comfortably?  5 minutes    How long can you walk comfortably?  10 ft    Patient Stated Goals  Walk without walker, walk further with walker, get some strength in back     Currently in Pain?  No/denies        TREATMENT  Ther-ex Nustep L2 for 5 min during history  Sit to stand2x5 with min-modA to come to stand with RW and modcueing for sequencing/gluteal squeeze,CGAto reduce "plopping" of poor eccentric control; 10 second holds; tactile assistance and verbal cueing for forward pelvic control for upright posture   Neuromuscular Re-education  In Standing:   Forward and Backwards ambulation in // bars with cueing for bigger step length, upright posture, and hand positioning2x length of // bars. Cueing to PSIS/ASIS for optimal muscle recruitment and positioning   Gait Training  Ambulated 71' with RW with a close wheelchair follow and CGA throughout.  Moderate verbal cueing for erect posture, heel strike and equal step length.     Pt educated throughout session about proper posture and technique with exercises. Improved exercise technique, movement at target joints, use of target muscles after min to mod verbal, visual, tactile cues  Pt displayed good motivation throughout today's session.  He required min-modA to come to stand from his wheelchair using a rolling walker.  He was able to ambulate 90 ft today with a RW with CGA throughout and a close wheelchair follow, requiring cueing for correct posture and technique.  Pt will continue to benefit from skilled PT services to address strength, balance and mobility deficits in order to improve function at home.                            PT Short Term Goals - 12/22/18 1049      PT SHORT TERM GOAL #1   Title  Patient will be independent in home exercise program to improve strength/mobility for better functional independence with ADLs.    Baseline  hep  compliant    Time  2    Period  Weeks    Status  Achieved      PT SHORT TERM GOAL #2   Title  Patient will require min cueing for STS transfer with CGA for increased independence with mobility.     Baseline  CGA    Time  2    Period  Weeks    Status  Achieved      PT SHORT TERM GOAL #3   Title  Patient will maintain upright posture for > 15 seconds to demonstrate strengthened postural control muscualture     Baseline  18 seconds     Time  2    Period  Weeks    Status  Achieved        PT Long Term Goals - 12/22/18 0001      PT LONG TERM GOAL #1   Title  Patient will perform 10 MWT in < 1 minute to demonstrate improved ambulatory velocity and capacity.     Baseline  4/10: 2 minutes 45 seconds; 5/29: 2 minute 31 seconds; 3 minutes 22 seconds 8/22: 3 minutes 2 seconds  9/12:  2 minutes and 43 seconds 10/15: 73mnute 12 seconds 11/7: 1 minute 21 seconds 12/19: 1 min 16 seconds 3/3: 57 seconds; 08/26/18: 1:41.4s= 0.10 m/s 7/1: 1 minute 14 seconds 9/14: 61 seconds    Time  8    Period  Weeks    Status  Partially Met    Target Date  02/15/19      PT LONG TERM GOAL #2   Title  Patient (< 646years old) will complete five times sit to stand test in < 10 seconds indicating an increased LE strength and improved balance.    Baseline  [facility] and one on WC, 80% upright posture 7/1: 22 seconds one hand on walker one hand on w/c  9/14: 53 seconds with full upright posture    Time  8    Period  Weeks    Status  On-going    Target Date  02/15/19      PT LONG TERM GOAL #3   Title  Patient will increase Berg Balance score by > 6 points (26/56) to demonstrate decreased fall risk during functional activities.    Baseline  9/12: 20/56 10/15: 21/56 11/7: 20/56 12/19: 22/56; 08/26/18: 11/56 7/1: 17/56  9/14: 23/56    Time  8    Period  Weeks    Status  Partially Met    Target Date  02/15/19      PT LONG TERM GOAL #4  Title  Patient will increase BLE gross strength  to 4+/5 as to improve functional strength for independent gait, increased standing tolerance and increased ADL ability.    Baseline   4-/5 RLE, 10/15: 4/5 RLE 11/7: 4/5 RLE 3/3: 4/5 7/1:  hip flexion 4/5 hip extension 2/5 hip abduction/adduciton 2/5     Time  8    Period  Weeks    Status  On-going    Target Date  02/15/19      PT LONG TERM GOAL #5   Title  Patient will increase lower extremity functional scale to >40/80 to demonstrate improved functional mobility and increased tolerance with ADLs.     Baseline  14/80; 12/26: 21/80; 2/18: 28/80  4/10; 25/80 5/29: 27/80; 7/16: 29/80 8/22: 30/80  9/12: 26/80 (feeling more tired due to moving) 10/15: 37/80 11/7: 23/80 3/3: 32/80; 08/25/18: 28/80 9/15: 32/56    Time  8    Period  Weeks    Status  Partially Met    Target Date  02/15/19      PT LONG TERM GOAL #6   Title  Patient will ambulate 150 ft with least assistive device and Supervision with no breaks to allow for increased mobility within home.    Baseline  11/7: 133f 12/19: 115 ft  3/3: will assess next session, has done 200 ft with one rest break; 08/26/18: Pt fatigued after 30' today 9/14: 100 ft with CGA    Time  8    Period  Weeks    Status  Partially Met    Target Date  02/15/19            Plan - 12/30/18 1739    Clinical Impression Statement  Pt displayed good motivation throughout today's session.  He required min-modA to come to stand from his wheelchair using a rolling walker.  He was able to ambulate 90 ft today with a RW with CGA throughout and a close wheelchair follow, requiring cueing for correct posture and technique.  Pt will continue to benefit from skilled PT services to address strength, balance and mobility deficits in order to improve function at home.    Rehab Potential  Fair    Clinical Impairments Affecting Rehab Potential  hx of HTN, HLD, CVA, learning disability, Diabetes, brain tumor,     PT Frequency  2x / week    PT Duration  8 weeks    PT  Treatment/Interventions  ADLs/Self Care Home Management;Aquatic Therapy;Ultrasound;Moist Heat;Traction;DME Instruction;Gait training;Stair training;Functional mobility training;Therapeutic activities;Therapeutic exercise;Orthotic Fit/Training;Neuromuscular re-education;Balance training;Patient/family education;Manual techniques;Wheelchair mobility training;Passive range of motion;Energy conservation;Taping;Visual/perceptual remediation/compensation    PT Next Visit Plan  progress ambulation for duration    PT Home Exercise Plan  TrA contraction, glute sets: needs updates. Potentially some time in supine flat in hospital bed, to improve hip extension deficits.     Consulted and Agree with Plan of Care  Patient       Patient will benefit from skilled therapeutic intervention in order to improve the following deficits and impairments:  Abnormal gait, Decreased activity tolerance, Decreased balance, Decreased knowledge of precautions, Decreased endurance, Decreased coordination, Decreased knowledge of use of DME, Decreased mobility, Decreased range of motion, Difficulty walking, Decreased safety awareness, Decreased strength, Impaired flexibility, Impaired perceived functional ability, Impaired tone, Postural dysfunction, Improper body mechanics, Pain  Visit Diagnosis: Muscle weakness (generalized)  Other lack of coordination  Other abnormalities of gait and mobility     Problem List Patient Active Problem List   Diagnosis Date  Noted  . Acute CVA (cerebrovascular accident) ([place]) 05/02/2016  . Left-sided weakness 05/01/2016    This entire session was performed under direct supervision and direction of a licensed therapist/therapist assistant . I have personally read, edited and approve of the note as written.   Lutricia Horsfall, SPT Phillips Grout PT, DPT, GCS p Huprich,Jason 12/31/2018, 10:36 AM  [place] MAIN [facility] SERVICES [address]  [place], [place], [address] Phone: [phone]   Fax:  [phone]  Name: Curtis Powell MRN: [id number] Date of Birth: 1966/01/02

## 2018-12-30 NOTE — Therapy (Addendum)
[place] MAIN [facility] SERVICES [address] [place], [place], [address] Phone: [phone]   Fax:  [phone]  Occupational Therapy Treatment  Patient Details  Name: Curtis Powell MRN: [id number] Date of Birth: June 29, 1965 No data recorded  Encounter Date: 12/30/2018  OT End of Session - 12/30/18 1448    Visit Number  134    Number of Visits  137    Date for OT Re-Evaluation  02/10/19    Authorization Type  Progress reporting period starting 12/09/2018    OT Start Time  1433    OT Stop Time  1526    OT Time Calculation (min)  53 min    Activity Tolerance  Patient tolerated treatment well    Behavior During Therapy  [facility] for tasks assessed/performed       Past Medical History:  Diagnosis Date  . GERD (gastroesophageal reflux disease)   . Hyperlipidemia   . Hypertension   . Obesity   . Stroke [facility])     Past Surgical History:  Procedure Laterality Date  . BRAIN SURGERY      There were no vitals filed for this visit.  Subjective Assessment - 12/30/18 1444    Subjective   Pt. reports feeling better overall.    Patient is accompanied by:  Family member    Pertinent History  Pt. is a 53 y.o. male who suffered a CVA on 05/01/2016. Pt. was admitted to the hospital. Once discharged, he received Home Health PT and OT services for about a month. Pt. has had multiple CVAs over the past 8 years, and has had multiple falls in the past 6 months. Pt. resides in an apartment. Pt. has caregivers for 80 hours. Pt.'s mother stays with pt. at night, and assists with IADL tasks.      Currently in Pain?  No/denies      OT TREATMENT    Neuro muscular re-education:  Pt. worked on reaching using the shape tower. Pt. grasped and moved the shapes through 3vertical dowels of varying heights using his RUE. Pt. worked on right hand [facility] skills with the right hand grasping 1/8" small pegs, and placing them onto an extra small dowel. Pt. worked on using a 2pt.  pincer grasp to pick the pegs up.  Self care:  Pt. Performed toileting skills with CGA-S in standing for hiking clothing. independent after set-up for hand hygiene.  Response to Treatment  Pt. continues to present with limited RUE strength, limited motor control, and limited functional reaching with increases compensation proximally with lateral leaning to the left with [facility] skills. Pt. with improved compensation proximally with no lateral trunk leaning with RUE functional reaching. Pt. continues to work on improving LUE strength, motor control, and [facility] skills during ADLs and IADL functioning                      OT Education - 12/30/18 1448    Education provided  Yes    Education Details  UE reaching, Fine motor coordination skills    Person(s) Educated  Patient    Methods  Explanation;Demonstration    Comprehension  Verbalized understanding;Returned demonstration;Verbal cues required       OT Short Term Goals - 03/03/17 1459      OT SHORT TERM GOAL #1   Title  `        OT Long Term Goals - 12/09/18 1311      OT LONG TERM GOAL #2  Title  Pt. will complete self grooming with minA      OT LONG TERM GOAL #4   Title  Pt. will perform self dressing with minA and A/E as needed.    Baseline  Pt. has improved with hiking pants/shorts in standing. ModA socks, and min-modA donning pants over feet. MinA donning a jacket. Indpendent with a T-shirt    Time  12    Period  Weeks    Target Date  02/10/19      OT LONG TERM GOAL #6   Title  Pt. will write his name efficiently with 100% legibility    Baseline  Pt continues to present with limited legibility and efficiency when writing name to sign in when riding the [address]    Time  12    Period  Weeks    Status  On-going    Target Date  02/10/19      OT LONG TERM GOAL #7   Title  Pt will independently and consistently follow HEP to increase UE strength to increase functional independence    Baseline  Pt to continues to  work towards independence with exercises    Time  12    Period  Weeks    Status  On-going    Target Date  02/10/19      OT LONG TERM GOAL #8   Title  Pt. will require supervision ironing a shirt.    Baseline  Pt. requires CGA-minA, and complete set-up seated using a tabletop iron    Time  12    Period  Weeks    Status  On-going    Target Date  02/10/19      OT LONG TERM GOAL  #9   Baseline  Pt. will independently be able to sew a button onto a shirt.    Time  12    Period  Weeks    Status  On-going    Target Date  02/10/19      OT LONG TERM GOAL  #10   TITLE  Pt. will independently be able to throw a ball    Baseline  Pt. continues to be limited    Time  12    Period  Weeks    Target Date  02/10/19      OT LONG TERM GOAL  #11   TITLE  Pt. will improve UE functional reaching to be able to independently use his RUE to hand his clothes in the closest.    Baseline  Pt. continues to have difficulty.    Time  12    Period  Weeks    Status  On-going    Target Date  02/10/19            Plan - 12/30/18 1450    Clinical Impression Statement  Pt. is making progress overall. Pt. is cooking more now using a toaster oven. Pt. continues to present with limited RUE strength, limited motor control, and limited functional reaching with increases compensation proximally with lateral leaning to the left with [facility] skills. Pt. with improved compensation proximally with no lateral trunk leaning with RUE functional reaching. Pt. continues to work on improving LUE strength, motor control, and [facility] skills during ADLs and IADL functioning.    OT Occupational Profile and History  Problem Focused Assessment - Including review of records relating to presenting problem    Occupational performance deficits (Please refer to evaluation for details):  ADL's;IADL's    Body Structure / Function /  Physical Skills  ADL;FMC;ROM;IADL;Pain;Coordination    Rehab Potential  Fair    Clinical Decision Making   Several treatment options, min-mod task modification necessary    OT Frequency  2x / week    OT Duration  12 weeks    OT Treatment/Interventions  Self-care/ADL training;Patient/family education;DME and/or AE instruction;Therapeutic exercise;Moist Heat;Neuromuscular education    Consulted and Agree with Plan of Care  Patient       Patient will benefit from skilled therapeutic intervention in order to improve the following deficits and impairments:   Body Structure / Function / Physical Skills: ADL, FMC, ROM, IADL, Pain, Coordination       Visit Diagnosis: Muscle weakness (generalized)  Other lack of coordination    Problem List Patient Active Problem List   Diagnosis Date Noted  . Acute CVA (cerebrovascular accident) ([place]) 05/02/2016  . Left-sided weakness 05/01/2016    Harrel Carina, MS, OTR/L 12/30/2018, 3:51 PM  [place] MAIN [facility] SERVICES [address] [place], [place], [address] Phone: [phone]   Fax:  [phone]  Name: KAULIN PARROTTE MRN: [id number] Date of Birth: February 09, 1966

## 2019-01-04 ENCOUNTER — Encounter: Payer: Self-pay | Admitting: Occupational Therapy

## 2019-01-04 ENCOUNTER — Ambulatory Visit: Payer: Medicare HMO

## 2019-01-04 ENCOUNTER — Other Ambulatory Visit: Payer: Self-pay

## 2019-01-04 ENCOUNTER — Ambulatory Visit: Payer: Medicare HMO | Admitting: Occupational Therapy

## 2019-01-04 DIAGNOSIS — R278 Other lack of coordination: Secondary | ICD-10-CM

## 2019-01-04 DIAGNOSIS — M6281 Muscle weakness (generalized): Secondary | ICD-10-CM

## 2019-01-04 DIAGNOSIS — R2689 Other abnormalities of gait and mobility: Secondary | ICD-10-CM

## 2019-01-04 NOTE — Therapy (Signed)
[place] MAIN [facility] SERVICES [address] [place], [place], [address] Phone: [phone]   Fax:  [phone]  Occupational Therapy Treatment  Patient Details  Name: Curtis Powell MRN: [id number] Date of Birth: 10/19/1965 No data recorded  Encounter Date: 01/04/2019  OT End of Session - 01/04/19 1657    Visit Number  135    Number of Visits  137    Date for OT Re-Evaluation  02/10/19    Authorization Type  Progress reporting period starting 12/09/2018    OT Start Time  1435    OT Stop Time  1515    OT Time Calculation (min)  40 min    Equipment Utilized During Treatment  Long handled shoe horn    Activity Tolerance  Patient tolerated treatment well    Behavior During Therapy  WFL for tasks assessed/performed       Past Medical History:  Diagnosis Date  . GERD (gastroesophageal reflux disease)   . Hyperlipidemia   . Hypertension   . Obesity   . Stroke [facility])     Past Surgical History:  Procedure Laterality Date  . BRAIN SURGERY      There were no vitals filed for this visit.  Subjective Assessment - 01/04/19 1655    Subjective   Pt. reports feeling better overall.    Patient is accompanied by:  Family member    Pertinent History  Pt. is a 53 y.o. male who suffered a CVA on 05/01/2016. Pt. was admitted to the hospital. Once discharged, he received Home Health PT and OT services for about a month. Pt. has had multiple CVAs over the past 8 years, and has had multiple falls in the past 6 months. Pt. resides in an apartment. Pt. has caregivers for 80 hours. Pt.'s mother stays with pt. at night, and assists with IADL tasks.      Currently in Pain?  No/denies      OT TREATMENT    Therapeutic Exercise:  Pt. performed Right gross gripping with grip strengthener. Pt. worked on sustaining grip while grasping pegs and reaching at various heights. The gripper was placed in 17.9# of grip strength force. Pt. Worked on pinch strengthening in  the right hand for lateral, and 3pt. pinch using yellow, red, green, and blue resistive clips. Pt. worked on placing the clips at various horizontal angles. Tactile and verbal cues were required for eliciting the desired movement.  Selfcare:  Pt. performed toileting skills with CGA in standing, and clothing negotiation skills. Independent with hand hygiene. Pt. Required modA to back in through the door secondary to a high threshold.  Response to Treatment   Pt. is making steady progress. Pt. continues to work on improving functional reacingi, RUE strength, and [facility] skills. Pt. continues to work on improving BUE strength, [facility] skills in order to improve UE functioning during ADLs, and IADL functioning.                   OT Education - 01/04/19 1656    Education provided  Yes    Education Details  UE reaching, Fine motor coordination skills    Person(s) Educated  Patient    Methods  Explanation;Demonstration    Comprehension  Verbalized understanding;Returned demonstration;Verbal cues required    Person(s) Educated  Patient    Methods  Explanation;Demonstration;Tactile cues;Verbal cues    Comprehension  Verbalized understanding;Returned demonstration;Verbal cues required;Tactile cues required       OT Short Term Goals -  03/03/17 1459      OT SHORT TERM GOAL #1   Title  `        OT Long Term Goals - 12/09/18 1311      OT LONG TERM GOAL #2   Title  Pt. will complete self grooming with minA      OT LONG TERM GOAL #4   Title  Pt. will perform self dressing with minA and A/E as needed.    Baseline  Pt. has improved with hiking pants/shorts in standing. ModA socks, and min-modA donning pants over feet. MinA donning a jacket. Indpendent with a T-shirt    Time  12    Period  Weeks    Target Date  02/10/19      OT LONG TERM GOAL #6   Title  Pt. will write his name efficiently with 100% legibility    Baseline  Pt continues to present with limited legibility and  efficiency when writing name to sign in when riding the [place]    Time  12    Period  Weeks    Status  On-going    Target Date  02/10/19      OT LONG TERM GOAL #7   Title  Pt will independently and consistently follow HEP to increase UE strength to increase functional independence    Baseline  Pt to continues to work towards independence with exercises    Time  12    Period  Weeks    Status  On-going    Target Date  02/10/19      OT LONG TERM GOAL #8   Title  Pt. will require supervision ironing a shirt.    Baseline  Pt. requires CGA-minA, and complete set-up seated using a tabletop iron    Time  12    Period  Weeks    Status  On-going    Target Date  02/10/19      OT LONG TERM GOAL  #9   Baseline  Pt. will independently be able to sew a button onto a shirt.    Time  12    Period  Weeks    Status  On-going    Target Date  02/10/19      OT LONG TERM GOAL  #10   TITLE  Pt. will independently be able to throw a ball    Baseline  Pt. continues to be limited    Time  12    Period  Weeks    Target Date  02/10/19      OT LONG TERM GOAL  #11   TITLE  Pt. will improve UE functional reaching to be able to independently use his RUE to hand his clothes in the closest.    Baseline  Pt. continues to have difficulty.    Time  12    Period  Weeks    Status  On-going    Target Date  02/10/19            Plan - 01/04/19 1657    Clinical Impression Statement  Pt. is making steady progress. Pt. continues to work on improving functional reacingi, RUE strength, and [facility] skills. Pt. continues to work on improving BUE strength, [facility] skills in order to improve UE functioning during ADLs, and IADL functioning.    OT Occupational Profile and History  Problem Focused Assessment - Including review of records relating to presenting problem    Occupational performance deficits (Please refer to evaluation for details):  ADL's;IADL's  Body Structure / Function / Physical Skills   ADL;FMC;ROM;IADL;Pain;Coordination    Rehab Potential  Fair    Clinical Decision Making  Several treatment options, min-mod task modification necessary    OT Frequency  2x / week    OT Duration  12 weeks    OT Treatment/Interventions  Self-care/ADL training;Patient/family education;DME and/or AE instruction;Therapeutic exercise;Moist Heat;Neuromuscular education    Consulted and Agree with Plan of Care  Patient       Patient will benefit from skilled therapeutic intervention in order to improve the following deficits and impairments:   Body Structure / Function / Physical Skills: ADL, FMC, ROM, IADL, Pain, Coordination       Visit Diagnosis: Muscle weakness (generalized)  Other lack of coordination    Problem List Patient Active Problem List   Diagnosis Date Noted  . Acute CVA (cerebrovascular accident) ([place]) 05/02/2016  . Left-sided weakness 05/01/2016    Curtis Carina, MS, OTR/L 01/04/2019, 5:07 PM  [place] MAIN [facility] SERVICES [address] [place], [place], [address] Phone: [phone]   Fax:  [phone]  Name: Curtis Powell MRN: [id number] Date of Birth: September 22, 1965

## 2019-01-04 NOTE — Therapy (Signed)
[place] MAIN [facility] SERVICES [address] [place], [place], [address] Phone: [phone]   Fax:  [phone]  Physical Therapy Treatment Physical Therapy Progress Note   Dates of reporting period  11/25/18   to  01/04/19  Patient Details  Name: Curtis Powell MRN: [id number] Date of Birth: 1965-05-04 No data recorded  Encounter Date: 01/04/2019  PT End of Session - 01/04/19 1522    Visit Number  140    Number of Visits  152    Date for PT Re-Evaluation  02/15/19    Authorization Type  10/10 PN 8/19; next session 1/10 PN 01/04/19    Authorization Time Period  authorized 11/16/18-05/19/19    PT Start Time  1515    PT Stop Time  1559    PT Time Calculation (min)  44 min    Equipment Utilized During Treatment  Gait belt    Activity Tolerance  Patient tolerated treatment well    Behavior During Therapy  WFL for tasks assessed/performed       Past Medical History:  Diagnosis Date  . GERD (gastroesophageal reflux disease)   . Hyperlipidemia   . Hypertension   . Obesity   . Stroke [facility])     Past Surgical History:  Procedure Laterality Date  . BRAIN SURGERY      There were no vitals filed for this visit.  Subjective Assessment - 01/04/19 1516    Subjective  Patient reports no pain this session. Reports no falls since last session. No questions or concerns.    Pertinent History   Patient is a pleasant 53 year old male who presents to physical therapy for weakness and immobility secondary to CVA.  Had a stroke in Feb 2018. He was previously fully independent, but this stroke caused severe residual deficits, mainly on the right side as well as speech, and now he is unable to walk or perform most of his ADLs on his own. Entire right side is very weak. He still has a little difficulty with speech but his swallowing is improved to baseline. His mom had to move in with him and now is his main caregiver.     Limitations   Sitting;Lifting;Standing;Walking;House hold activities    How long can you sit comfortably?  5 minutes    How long can you stand comfortably?  5 minutes    How long can you walk comfortably?  10 ft    Patient Stated Goals  Walk without walker, walk further with walker, get some strength in back     Currently in Pain?  No/denies            Goals done 9/14: please refer to this note for further details about progress.    Ther-ex  Sit to stand 10x with mod cueing for sequencing/gluteal squeeze, CGA to reduce "plopping" of poor eccentric control; 10 second holds; tactile assistance and verbal cueing for forward pelvic control for upright posture    Standing  -static upright posture with mirror in front of patient 60 seconds.    Static standing hip extensions.  in // bars with cueing for bigger step length, upright posture, and hand positioning 5x each LE Cueing to PSIS/ASIS for optimal muscle recruitment and positioning   Side stepping in // bars 2x length of // bars, CGA; max cueing for sequencing and pelvic alignment.     Seated:   Seated adduction squeezes 15x 5 second holds   Seated soccer ball kicks for  coordination, LAQ, spatial awareness, and timing of muscle recruitment. X 20 each LE.     seated upright posture marches for core activation with arms crossed against chest x 15 each LE   Seated adduction ball squeezes 3 second holds 15x  -IR/ER 20x each LE upright posture; Red TB resistance around ankles   GTB hamstring curls 15x each LE, cueing for full movement of RLE   Seated heel toe raises 20x  GTB adduction against PT resistance 10x each LE  Seated modified windmill 8x each side; very challenging to patient    Pt educated throughout session about proper posture and technique with exercises. Improved exercise technique, movement at target joints, use of target muscles after min to mod verbal, visual, tactile cues   HR monitored throughout session with seated rest  breaks to reduce rate to therapeutic range after standing interventions.    Patient's condition has the potential to improve in response to therapy. Maximum improvement is yet to be obtained. The anticipated improvement is attainable and reasonable in a generally predictable time.  Patient reports he is improving with his ability to move at home.                    PT Education - 01/04/19 1516    Education provided  Yes    Education Details  exercise technique, stability    Person(s) Educated  Patient    Methods  Explanation;Demonstration;Tactile cues;Verbal cues    Comprehension  Verbalized understanding;Returned demonstration;Verbal cues required;Tactile cues required       PT Short Term Goals - 12/22/18 1049      PT SHORT TERM GOAL #1   Title  Patient will be independent in home exercise program to improve strength/mobility for better functional independence with ADLs.    Baseline  hep compliant    Time  2    Period  Weeks    Status  Achieved      PT SHORT TERM GOAL #2   Title  Patient will require min cueing for STS transfer with CGA for increased independence with mobility.     Baseline  CGA    Time  2    Period  Weeks    Status  Achieved      PT SHORT TERM GOAL #3   Title  Patient will maintain upright posture for > 15 seconds to demonstrate strengthened postural control muscualture     Baseline  18 seconds     Time  2    Period  Weeks    Status  Achieved        PT Long Term Goals - 12/22/18 0001      PT LONG TERM GOAL #1   Title  Patient will perform 10 MWT in < 1 minute to demonstrate improved ambulatory velocity and capacity.     Baseline  4/10: 2 minutes 45 seconds; 5/29: 2 minute 31 seconds; 3 minutes 22 seconds 8/22: 3 minutes 2 seconds  9/12:  2 minutes and 43 seconds 10/15: 15mnute 12 seconds 11/7: 1 minute 21 seconds 12/19: 1 min 16 seconds 3/3: 57 seconds; 08/26/18: 1:41.4s= 0.10 m/s 7/1: 1 minute 14 seconds 9/14: 61 seconds    Time  8     Period  Weeks    Status  Partially Met    Target Date  02/15/19      PT LONG TERM GOAL #2   Title  Patient (< 631years old) will complete five times sit to stand  test in < 10 seconds indicating an increased LE strength and improved balance.    Baseline  5/19: 20.6s from [facility], one hand on walker and one on WC, 80% upright posture 7/1: 22 seconds one hand on walker one hand on w/c  9/14: 53 seconds with full upright posture    Time  8    Period  Weeks    Status  On-going    Target Date  02/15/19      PT LONG TERM GOAL #3   Title  Patient will increase Berg Balance score by > 6 points (26/56) to demonstrate decreased fall risk during functional activities.    Baseline  9/12: 20/56 10/15: 21/56 11/7: 20/56 12/19: 22/56; 08/26/18: 11/56 7/1: 17/56  9/14: 23/56    Time  8    Period  Weeks    Status  Partially Met    Target Date  02/15/19      PT LONG TERM GOAL #4   Title  Patient will increase BLE gross strength to 4+/5 as to improve functional strength for independent gait, increased standing tolerance and increased ADL ability.    Baseline   4-/5 RLE, 10/15: 4/5 RLE 11/7: 4/5 RLE 3/3: 4/5 7/1:  hip flexion 4/5 hip extension 2/5 hip abduction/adduciton 2/5     Time  8    Period  Weeks    Status  On-going    Target Date  02/15/19      PT LONG TERM GOAL #5   Title  Patient will increase lower extremity functional scale to >40/80 to demonstrate improved functional mobility and increased tolerance with ADLs.     Baseline  14/80; 12/26: 21/80; 2/18: 28/80  4/10; 25/80 5/29: 27/80; 7/16: 29/80 8/22: 30/80  9/12: 26/80 (feeling more tired due to moving) 10/15: 37/80 11/7: 23/80 3/3: 32/80; 08/25/18: 28/80 9/15: 32/56    Time  8    Period  Weeks    Status  Partially Met    Target Date  02/15/19      PT LONG TERM GOAL #6   Title  Patient will ambulate 150 ft with least assistive device and Supervision with no breaks to allow for increased mobility within home.    Baseline  11/7: 155f 12/19: 115  ft  3/3: will assess next session, has done 200 ft with one rest break; 08/26/18: Pt fatigued after 30' today 9/14: 100 ft with CGA    Time  8    Period  Weeks    Status  Partially Met    Target Date  02/15/19            Plan - 01/04/19 1536    Clinical Impression Statement  Patient goals performed 9/14: please refer to this note for further details about progress. Patient fatigued quickly this session and required monitoring of HR to maintain within therapeutic range. Patient's condition has the potential to improve in response to therapy. Maximum improvement is yet to be obtained. The anticipated improvement is attainable and reasonable in a generally predictable time.Pt will continue to benefit from skilled PT services to address strength, balance and mobility deficits in order to improve function at home.    Rehab Potential  Fair    Clinical Impairments Affecting Rehab Potential  hx of HTN, HLD, CVA, learning disability, Diabetes, brain tumor,     PT Frequency  2x / week    PT Duration  8 weeks    PT Treatment/Interventions  ADLs/Self Care Home Management;Aquatic Therapy;Ultrasound;Moist Heat;Traction;DME  Instruction;Gait training;Stair training;Functional mobility training;Therapeutic activities;Therapeutic exercise;Orthotic Fit/Training;Neuromuscular re-education;Balance training;Patient/family education;Manual techniques;Wheelchair mobility training;Passive range of motion;Energy conservation;Taping;Visual/perceptual remediation/compensation    PT Next Visit Plan  progress ambulation for duration    PT Home Exercise Plan  TrA contraction, glute sets: needs updates. Potentially some time in supine flat in hospital bed, to improve hip extension deficits.     Consulted and Agree with Plan of Care  Patient       Patient will benefit from skilled therapeutic intervention in order to improve the following deficits and impairments:  Abnormal gait, Decreased activity tolerance, Decreased  balance, Decreased knowledge of precautions, Decreased endurance, Decreased coordination, Decreased knowledge of use of DME, Decreased mobility, Decreased range of motion, Difficulty walking, Decreased safety awareness, Decreased strength, Impaired flexibility, Impaired perceived functional ability, Impaired tone, Postural dysfunction, Improper body mechanics, Pain  Visit Diagnosis: Muscle weakness (generalized)  Other lack of coordination  Other abnormalities of gait and mobility     Problem List Patient Active Problem List   Diagnosis Date Noted  . Acute CVA (cerebrovascular accident) ([place]) 05/02/2016  . Left-sided weakness 05/01/2016   Janna Arch, PT, DPT   01/04/2019, 3:58 PM  [place] MAIN [facility] SERVICES [address] [place], [place], [address] Phone: [phone]   Fax:  [phone]  Name: Curtis Powell MRN: [id number] Date of Birth: 1965/06/22

## 2019-01-06 ENCOUNTER — Other Ambulatory Visit: Payer: Self-pay

## 2019-01-06 ENCOUNTER — Ambulatory Visit: Payer: Medicare HMO

## 2019-01-06 ENCOUNTER — Ambulatory Visit: Payer: Medicare HMO | Admitting: Occupational Therapy

## 2019-01-06 ENCOUNTER — Encounter: Payer: Self-pay | Admitting: Occupational Therapy

## 2019-01-06 DIAGNOSIS — R2689 Other abnormalities of gait and mobility: Secondary | ICD-10-CM

## 2019-01-06 DIAGNOSIS — M6281 Muscle weakness (generalized): Secondary | ICD-10-CM | POA: Diagnosis not present

## 2019-01-06 DIAGNOSIS — R278 Other lack of coordination: Secondary | ICD-10-CM

## 2019-01-06 DIAGNOSIS — I69351 Hemiplegia and hemiparesis following cerebral infarction affecting right dominant side: Secondary | ICD-10-CM

## 2019-01-06 NOTE — Therapy (Signed)
[name] MAIN [facility] SERVICES [address] [place], [place], [address] Phone: [phone]   Fax:  [phone]  Occupational Therapy Treatment  Patient Details  Name: Curtis Powell MRN: [id number] Date of Birth: January 26, 1966 No data recorded  Encounter Date: 01/06/2019  OT End of Session - 01/06/19 1440    Visit Number  136    Number of Visits  161    Date for OT Re-Evaluation  02/10/19    Authorization Type  Progress reporting period starting 12/09/2018    OT Start Time  1435    OT Stop Time  1515    OT Time Calculation (min)  40 min    Equipment Utilized During Treatment  Long handled shoe horn    Activity Tolerance  Patient tolerated treatment well    Behavior During Therapy  WFL for tasks assessed/performed       Past Medical History:  Diagnosis Date  . GERD (gastroesophageal reflux disease)   . Hyperlipidemia   . Hypertension   . Obesity   . Stroke [facility])     Past Surgical History:  Procedure Laterality Date  . BRAIN SURGERY      There were no vitals filed for this visit.  Subjective Assessment - 01/06/19 1439    Subjective   Pt. reports feeling better overall.    Pertinent History  Pt. is a 53 y.o. male who suffered a CVA on 05/01/2016. Pt. was admitted to the hospital. Once discharged, he received Home Health PT and OT services for about a month. Pt. has had multiple CVAs over the past 8 years, and has had multiple falls in the past 6 months. Pt. resides in an apartment. Pt. has caregivers for 80 hours. Pt.'s mother stays with pt. at night, and assists with IADL tasks.      Currently in Pain?  No/denies       OT TREATMENT    Neuro muscular re-education:  Pt. worked on reaching using the shape tower. Pt. grasped and moved the shapes through 3vertical dowels of varying heights using his RUE.   Selfcare:  Pt. worked Geographical information systems officer, and maneuvering the w/c through the doorways, managing the door, and  negotiating the threshold.  Response to Treatment   Pt. is making steady progress overall. pt. is now able to perform toileing skills independently standing at the toilet. Independent with hand hygiene skills, and independent with maneuvering the w/c through the threshold of of a bathroom door that stays open when going through. However, pt. has difficulty getting through a door that does not stay open. Pt. continues to work on improving functiaonl reaching, BUE strength, and [facility] skills in order to improve overall ADL, and IADL functioning.                         OT Education - 01/06/19 1440    Education provided  Yes    Education Details  UE reaching, Fine motor coordination skills    Person(s) Educated  Patient    Methods  Explanation;Demonstration    Comprehension  Verbalized understanding;Returned demonstration;Verbal cues required       OT Short Term Goals - 03/03/17 1459      OT SHORT TERM GOAL #1   Title  `        OT Long Term Goals - 12/09/18 1311      OT LONG TERM GOAL #2   Title  Pt. will complete self grooming  with minA      OT LONG TERM GOAL #4   Title  Pt. will perform self dressing with minA and A/E as needed.    Baseline  Pt. has improved with hiking pants/shorts in standing. ModA socks, and min-modA donning pants over feet. MinA donning a jacket. Indpendent with a T-shirt    Time  12    Period  Weeks    Target Date  02/10/19      OT LONG TERM GOAL #6   Title  Pt. will write his name efficiently with 100% legibility    Baseline  Pt continues to present with limited legibility and efficiency when writing name to sign in when riding the [place]    Time  12    Period  Weeks    Status  On-going    Target Date  02/10/19      OT LONG TERM GOAL #7   Title  Pt will independently and consistently follow HEP to increase UE strength to increase functional independence    Baseline  Pt to continues to work towards independence with exercises    Time   12    Period  Weeks    Status  On-going    Target Date  02/10/19      OT LONG TERM GOAL #8   Title  Pt. will require supervision ironing a shirt.    Baseline  Pt. requires CGA-minA, and complete set-up seated using a tabletop iron    Time  12    Period  Weeks    Status  On-going    Target Date  02/10/19      OT LONG TERM GOAL  #9   Baseline  Pt. will independently be able to sew a button onto a shirt.    Time  12    Period  Weeks    Status  On-going    Target Date  02/10/19      OT LONG TERM GOAL  #10   TITLE  Pt. will independently be able to throw a ball    Baseline  Pt. continues to be limited    Time  12    Period  Weeks    Target Date  02/10/19      OT LONG TERM GOAL  #11   TITLE  Pt. will improve UE functional reaching to be able to independently use his RUE to hand his clothes in the closest.    Baseline  Pt. continues to have difficulty.    Time  12    Period  Weeks    Status  On-going    Target Date  02/10/19            Plan - 01/06/19 1443    Clinical Impression Statement  Pt. is making steady progress overall. pt. is now able to perform toileing skills independently standing at the toilet. Independent with hand hygiene skills, and independent with maneuvering the w/c through the threshold of of a bathroom door that stays open when going through. However, pt. has difficulty getting through a door that does not stay open. Pt. continues to work on improving functiaonl reaching, BUE strength, and [facility] skills in order to improve overall ADL, and IADL functioning.    OT Occupational Profile and History  Problem Focused Assessment - Including review of records relating to presenting problem    Occupational performance deficits (Please refer to evaluation for details):  ADL's;IADL's    Body Structure / Function / Physical Skills  ADL;FMC;ROM;IADL;Pain;Coordination    Rehab Potential  Fair    Clinical Decision Making  Several treatment options, min-mod task  modification necessary    OT Frequency  2x / week    OT Duration  12 weeks    OT Treatment/Interventions  Self-care/ADL training;Patient/family education;DME and/or AE instruction;Therapeutic exercise;Moist Heat;Neuromuscular education    Consulted and Agree with Plan of Care  Patient       Patient will benefit from skilled therapeutic intervention in order to improve the following deficits and impairments:   Body Structure / Function / Physical Skills: ADL, FMC, ROM, IADL, Pain, Coordination       Visit Diagnosis: Muscle weakness (generalized)  Other lack of coordination    Problem List Patient Active Problem List   Diagnosis Date Noted  . Acute CVA (cerebrovascular accident) ([place]) 05/02/2016  . Left-sided weakness 05/01/2016    Harrel Carina, MS, OTR/L 01/06/2019, 5:54 PM  [place] MAIN [facility] SERVICES [address] [place], [place], [address] Phone: [phone]   Fax:  [phone]  Name: Curtis Powell MRN: [id number] Date of Birth: [birth year]

## 2019-01-06 NOTE — Therapy (Signed)
[place] MAIN [facility] SERVICES [address] [place], [place], [address] Phone: [phone]   Fax:  [phone]  Physical Therapy Treatment  Patient Details  Name: Curtis Powell MRN: [id number] Date of Birth: 1965-12-01 No data recorded  Encounter Date: 01/06/2019  PT End of Session - 01/06/19 1414    Visit Number  141    Number of Visits  152    Date for PT Re-Evaluation  02/15/19    Authorization Type  next session 1/10 PN 01/04/19    Authorization Time Period  authorized 11/16/18-05/19/19    PT Start Time  1345    PT Stop Time  1429    PT Time Calculation (min)  44 min    Equipment Utilized During Treatment  Gait belt    Activity Tolerance  Patient tolerated treatment well    Behavior During Therapy  WFL for tasks assessed/performed       Past Medical History:  Diagnosis Date  . GERD (gastroesophageal reflux disease)   . Hyperlipidemia   . Hypertension   . Obesity   . Stroke [facility])     Past Surgical History:  Procedure Laterality Date  . BRAIN SURGERY      There were no vitals filed for this visit.  Subjective Assessment - 01/06/19 1413    Subjective  Patient reports he went to the doctor who said he was having narrowing of an artery and will have to keep an eye on it every year.    Pertinent History   Patient is a pleasant 53 year old male who presents to physical therapy for weakness and immobility secondary to CVA.  Had a stroke in Feb 2018. He was previously fully independent, but this stroke caused severe residual deficits, mainly on the right side as well as speech, and now he is unable to walk or perform most of his ADLs on his own. Entire right side is very weak. He still has a little difficulty with speech but his swallowing is improved to baseline. His mom had to move in with him and now is his main caregiver.     Limitations  Sitting;Lifting;Standing;Walking;House hold activities    How long can you sit comfortably?  5  minutes    How long can you stand comfortably?  5 minutes    How long can you walk comfortably?  10 ft    Patient Stated Goals  Walk without walker, walk further with walker, get some strength in back     Currently in Pain?  No/denies       Ambulated 60 ft with RW and CGA, w/c follow, cueing to PSIS and ASIS for steppage.   raised plinth table: Single arm sit to stand 12x, able to perform with no episodes of posterior LOB, cueing required for sequencing.   cross body punches for abdominal activation and postural control x 2 minutes  Cross body punches with ducking and upright posture 2 minutes for abdominal and postural control  Upright posture marching with focus on upright stability with LE movement x 2 minutes  Balloon taps inside/outside BOS without back rest, able to reach outside BOS without LOB and return to upright position, more challenging to the R x 2 minutes  Sitting transition to putting elbow/weight onto table and return to upright posture x 10 each side (added to HEP)  weighted ball (2000Gr) arc across body for core/trunk stability 10x each side, very challenging to patient.   GTB hamstring curls 10x  each LE  Modified windmills seated 8x each side with focus on upright posture                   PT Education - 01/06/19 1414    Education provided  Yes    Education Details  exercise technique, stability    Person(s) Educated  Patient    Methods  Explanation;Demonstration;Tactile cues;Verbal cues    Comprehension  Verbalized understanding;Returned demonstration;Verbal cues required;Tactile cues required       PT Short Term Goals - 12/22/18 1049      PT SHORT TERM GOAL #1   Title  Patient will be independent in home exercise program to improve strength/mobility for better functional independence with ADLs.    Baseline  hep compliant    Time  2    Period  Weeks    Status  Achieved      PT SHORT TERM GOAL #2   Title  Patient will require min  cueing for STS transfer with CGA for increased independence with mobility.     Baseline  CGA    Time  2    Period  Weeks    Status  Achieved      PT SHORT TERM GOAL #3   Title  Patient will maintain upright posture for > 15 seconds to demonstrate strengthened postural control muscualture     Baseline  18 seconds     Time  2    Period  Weeks    Status  Achieved        PT Long Term Goals - 12/22/18 0001      PT LONG TERM GOAL #1   Title  Patient will perform 10 MWT in < 1 minute to demonstrate improved ambulatory velocity and capacity.     Baseline  4/10: 2 minutes 45 seconds; 5/29: 2 minute 31 seconds; 3 minutes 22 seconds 8/22: 3 minutes 2 seconds  9/12:  2 minutes and 43 seconds 10/15: 80mnute 12 seconds 11/7: 1 minute 21 seconds 12/19: 1 min 16 seconds 3/3: 57 seconds; 08/26/18: 1:41.4s= 0.10 m/s 7/1: 1 minute 14 seconds 9/14: 61 seconds    Time  8    Period  Weeks    Status  Partially Met    Target Date  02/15/19      PT LONG TERM GOAL #2   Title  Patient (< 751years old) will complete five times sit to stand test in < 10 seconds indicating an increased LE strength and improved balance.    Baseline  5[facility] hand [facility] walk[facility] and one on WC, 80% upright posture 7/1: 22 seconds one hand on walker one hand on w/c  9/14: 53 seconds with full upright posture    Time  8    Period  Weeks    Status  On-going    Target Date  02/15/19      PT LONG TERM GOAL #3   Title  Patient will increase Berg Balance score by > 6 points (26/56) to demonstrate decreased fall risk during functional activities.    Baseline  9/12: 20/56 10/15: 21/56 11/7: 20/56 12/19: 22/56; 08/26/18: 11/56 7/1: 17/56  9/14: 23/56    Time  8    Period  Weeks    Status  Partially Met    Target Date  02/15/19      PT LONG TERM GOAL #4   Title  Patient will increase BLE gross strength to 4+/5 as to improve functional  strength for independent gait, increased standing tolerance and increased ADL ability.     Baseline   4-/5 RLE, 10/15: 4/5 RLE 11/7: 4/5 RLE 3/3: 4/5 7/1:  hip flexion 4/5 hip extension 2/5 hip abduction/adduciton 2/5     Time  8    Period  Weeks    Status  On-going    Target Date  02/15/19      PT LONG TERM GOAL #5   Title  Patient will increase lower extremity functional scale to >40/80 to demonstrate improved functional mobility and increased tolerance with ADLs.     Baseline  14/80; 12/26: 21/80; 2/18: 28/80  4/10; 25/80 5/29: 27/80; 7/16: 29/80 8/22: 30/80  9/12: 26/80 (feeling more tired due to moving) 10/15: 37/80 11/7: 23/80 3/3: 32/80; 08/25/18: 28/80 9/15: 32/56    Time  8    Period  Weeks    Status  Partially Met    Target Date  02/15/19      PT LONG TERM GOAL #6   Title  Patient will ambulate 150 ft with least assistive device and Supervision with no breaks to allow for increased mobility within home.    Baseline  11/7: 123f 12/19: 115 ft  3/3: will assess next session, has done 200 ft with one rest break; 08/26/18: Pt fatigued after 30' today 9/14: 100 ft with CGA    Time  8    Period  Weeks    Status  Partially Met    Target Date  02/15/19            Plan - 01/06/19 1419    Clinical Impression Statement  Patient presents with good motivation to today's physical therapy session with good motivation. Is challenged by lateral core stability interventions fatiguing quickly. Raised plinth table allows for patient to perform transfers with increased use of LE's rather than UE's. Pt will continue to benefit from skilled PT services to address strength, balance and mobility deficits in order to improve function at home.    Rehab Potential  Fair    Clinical Impairments Affecting Rehab Potential  hx of HTN, HLD, CVA, learning disability, Diabetes, brain tumor,     PT Frequency  2x / week    PT Duration  8 weeks    PT Treatment/Interventions  ADLs/Self Care Home Management;Aquatic Therapy;Ultrasound;Moist Heat;Traction;DME Instruction;Gait training;Stair  training;Functional mobility training;Therapeutic activities;Therapeutic exercise;Orthotic Fit/Training;Neuromuscular re-education;Balance training;Patient/family education;Manual techniques;Wheelchair mobility training;Passive range of motion;Energy conservation;Taping;Visual/perceptual remediation/compensation    PT Next Visit Plan  progress ambulation for duration    PT Home Exercise Plan  TrA contraction, glute sets: needs updates. Potentially some time in supine flat in hospital bed, to improve hip extension deficits.     Consulted and Agree with Plan of Care  Patient       Patient will benefit from skilled therapeutic intervention in order to improve the following deficits and impairments:  Abnormal gait, Decreased activity tolerance, Decreased balance, Decreased knowledge of precautions, Decreased endurance, Decreased coordination, Decreased knowledge of use of DME, Decreased mobility, Decreased range of motion, Difficulty walking, Decreased safety awareness, Decreased strength, Impaired flexibility, Impaired perceived functional ability, Impaired tone, Postural dysfunction, Improper body mechanics, Pain  Visit Diagnosis: Muscle weakness (generalized)  Other lack of coordination  Other abnormalities of gait and mobility  Hemiplegia and hemiparesis following cerebral infarction affecting right dominant side ([facility]     Problem List Patient Active Problem List   Diagnosis Date Noted  . Acute CV[place] (cerebrovascular accident) ([place] 05/02/2016  . Left-sided weakness  05/01/2016   Janna Arch, PT, DPT   01/06/2019, 2:30 PM  [place] MAIN [facility] SERVICES [address] [place], [place], [address] Phone: [phone]   Fax:  [phone]  Name: Curtis Powell MRN: [id number] Date of Birth: 06/30/1965

## 2019-01-11 ENCOUNTER — Encounter: Payer: Self-pay | Admitting: Occupational Therapy

## 2019-01-11 ENCOUNTER — Ambulatory Visit: Payer: Medicare HMO | Admitting: Physical Therapy

## 2019-01-11 ENCOUNTER — Encounter: Payer: Self-pay | Admitting: Physical Therapy

## 2019-01-11 ENCOUNTER — Ambulatory Visit: Payer: Medicare HMO | Attending: Family Medicine | Admitting: Occupational Therapy

## 2019-01-11 ENCOUNTER — Other Ambulatory Visit: Payer: Self-pay

## 2019-01-11 DIAGNOSIS — R278 Other lack of coordination: Secondary | ICD-10-CM

## 2019-01-11 DIAGNOSIS — M6281 Muscle weakness (generalized): Secondary | ICD-10-CM

## 2019-01-11 DIAGNOSIS — I69351 Hemiplegia and hemiparesis following cerebral infarction affecting right dominant side: Secondary | ICD-10-CM | POA: Diagnosis present

## 2019-01-11 DIAGNOSIS — R2689 Other abnormalities of gait and mobility: Secondary | ICD-10-CM | POA: Diagnosis present

## 2019-01-11 NOTE — Therapy (Signed)
[place] MAIN [facility] SERVICES [address] [place], [place], [address] Phone: [phone]   Fax:  [phone]  Physical Therapy Treatment  Patient Details  Name: Curtis Powell MRN: [id number] Date of Birth: 1966/04/04 No data recorded  Encounter Date: 01/11/2019  PT End of Session - 01/11/19 1544    Visit Number  142    Number of Visits  152    Date for PT Re-Evaluation  02/15/19    Authorization Type  next session 1/10 PN 01/04/19    Authorization Time Period  authorized 11/16/18-05/19/19    PT Start Time  0315    PT Stop Time  0355    PT Time Calculation (min)  40 min    Equipment Utilized During Treatment  Gait belt    Activity Tolerance  Patient tolerated treatment well    Behavior During Therapy  [facility] for tasks assessed/performed       Past Medical History:  Diagnosis Date  . GERD (gastroesophageal reflux disease)   . Hyperlipidemia   . Hypertension   . Obesity   . Stroke [facility])     Past Surgical History:  Procedure Laterality Date  . BRAIN SURGERY      There were no vitals filed for this visit.  Subjective Assessment - 01/11/19 1543    Subjective  Patient is doing ok today, no new concerns or pain.    Pertinent History   Patient is a pleasant 53 year old male who presents to physical therapy for weakness and immobility secondary to CVA.  Had a stroke in Feb 2018. He was previously fully independent, but this stroke caused severe residual deficits, mainly on the right side as well as speech, and now he is unable to walk or perform most of his ADLs on his own. Entire right side is very weak. He still has a little difficulty with speech but his swallowing is improved to baseline. His mom had to move in with him and now is his main caregiver.     Limitations  Sitting;Lifting;Standing;Walking;House hold activities    How long can you sit comfortably?  5 minutes    How long can you stand comfortably?  5 minutes    How long can you  walk comfortably?  10 ft    Patient Stated Goals  Walk without walker, walk further with walker, get some strength in back     Pain Onset  Yesterday         Treatment: Nu-step x 5 mins BUE and BLE  Ambulated 80  ft x 2 , 40 feet x 1 with RW and CGA, w/c follow, cueing to PSIS and ASIS for steppage.    Single arm sit to stand 10x, able to perform with no episodes of posterior LOB, cueing required for sequencing.   LAQ with 3 lbs BLE x 10 x 3 sets   Pt educated throughout session about proper posture and technique with exercises. Improved exercise technique, movement at target joints, use of target muscles after min to mod verbal, visual, tactile cues.                        PT Education - 01/11/19 1543    Education provided  Yes    Education Details  HEP    Person(s) Educated  Patient    Methods  Explanation;Demonstration    Comprehension  Verbalized understanding;Returned demonstration;Need further instruction       PT Short  Term Goals - 12/22/18 1049      PT SHORT TERM GOAL #1   Title  Patient will be independent in home exercise program to improve strength/mobility for better functional independence with ADLs.    Baseline  hep compliant    Time  2    Period  Weeks    Status  Achieved      PT SHORT TERM GOAL #2   Title  Patient will require min cueing for STS transfer with CGA for increased independence with mobility.     Baseline  CGA    Time  2    Period  Weeks    Status  Achieved      PT SHORT TERM GOAL #3   Title  Patient will maintain upright posture for > 15 seconds to demonstrate strengthened postural control muscualture     Baseline  18 seconds     Time  2    Period  Weeks    Status  Achieved        PT Long Term Goals - 12/22/18 0001      PT LONG TERM GOAL #1   Title  Patient will perform 10 MWT in < 1 minute to demonstrate improved ambulatory velocity and capacity.     Baseline  4/10: 2 minutes 45 seconds; 5/29: 2 minute 31  seconds; 3 minutes 22 seconds 8/22: 3 minutes 2 seconds  9/12:  2 minutes and 43 seconds 10/15: 81mnute 12 seconds 11/7: 1 minute 21 seconds 12/19: 1 min 16 seconds 3/3: 57 seconds; 08/26/18: 1:41.4s= 0.10 m/s 7/1: 1 minute 14 seconds 9/14: 61 seconds    Time  8    Period  Weeks    Status  Partially Met    Target Date  02/15/19      PT LONG TERM GOAL #2   Title  Patient (< 737years old) will complete five times sit to stand test in < 10 seconds indicating an increased LE strength and improved balance.    [facility]  [facility] and one on [facility], 80% upright posture 7/1: 22 seconds one hand on walker one hand on w/c  9/14: 53 seconds with full upright posture    Time  8    Period  Weeks    Status  On-going    Target Date  02/15/19      PT LONG TERM GOAL #3   Title  Patient will increase Berg Balance score by > 6 points (26/56) to demonstrate decreased fall risk during functional activities.    Baseline  9/12: 20/56 10/15: 21/56 11/7: 20/56 12/19: 22/56; 08/26/18: 11/56 7/1: 17/56  9/14: 23/56    Time  8    Period  Weeks    Status  Partially Met    Target Date  02/15/19      PT LONG TERM GOAL #4   Title  Patient will increase BLE gross strength to 4+/5 as to improve functional strength for independent gait, increased standing tolerance and increased ADL ability.    Baseline   4-/5 RLE, 10/15: 4/5 RLE 11/7: 4/5 RLE 3/3: 4/5 7/1:  hip flexion 4/5 hip extension 2/5 hip abduction/adduciton 2/5     Time  8    Period  Weeks    Status  On-going    Target Date  02/15/19      PT LONG TERM GOAL #5   Title  Patient will increase lower extremity functional scale to >40/80 to  demonstrate improved functional mobility and increased tolerance with ADLs.     Baseline  14/80; 12/26: 21/80; 2/18: 28/80  4/10; 25/80 5/29: 27/80; 7/16: 29/80 8/22: 30/80  9/12: 26/80 (feeling more tired due to moving) 10/15: 37/80 11/7: 23/80 3/3: 32/80; 08/25/18: 28/80 9/15: 32/56    Time  8    Period   Weeks    Status  Partially Met    Target Date  02/15/19      PT LONG TERM GOAL #6   Title  Patient will ambulate 150 ft with least assistive device and Supervision with no breaks to allow for increased mobility within home.    Baseline  11/7: 193f 12/19: 115 ft  3/3: will assess next session, has done 200 ft with one rest break; 08/26/18: Pt fatigued after 30' today 9/14: 100 ft with CGA    Time  8    Period  Weeks    Status  Partially Met    Target Date  02/15/19            Plan - 01/11/19 1544    Clinical Impression Statement  Patient has improved gait speed with RW. He contniues to have increased tone in BLE with gait deviations that include stepping on forefoot and lateral foot of RLE and lateral weight shifting during gait due to inability to flex his knees bilaterally. He has initial static standing balance loss that he is able to adjust and regain his balance with cuing. He will continue to benefit from skilled PT to improve mobility and strength.    Rehab Potential  Fair    Clinical Impairments Affecting Rehab Potential  hx of HTN, HLD, CVA, learning disability, Diabetes, brain tumor,     PT Frequency  2x / week    PT Duration  8 weeks    PT Treatment/Interventions  ADLs/Self Care Home Management;Aquatic Therapy;Ultrasound;Moist Heat;Traction;DME Instruction;Gait training;Stair training;Functional mobility training;Therapeutic activities;Therapeutic exercise;Orthotic Fit/Training;Neuromuscular re-education;Balance training;Patient/family education;Manual techniques;Wheelchair mobility training;Passive range of motion;Energy conservation;Taping;Visual/perceptual remediation/compensation    PT Next Visit Plan  progress ambulation for duration    PT Home Exercise Plan  TrA contraction, glute sets: needs updates. Potentially some time in supine flat in hospital bed, to improve hip extension deficits.     Consulted and Agree with Plan of Care  Patient       Patient will benefit  from skilled therapeutic intervention in order to improve the following deficits and impairments:  Abnormal gait, Decreased activity tolerance, Decreased balance, Decreased knowledge of precautions, Decreased endurance, Decreased coordination, Decreased knowledge of use of DME, Decreased mobility, Decreased range of motion, Difficulty walking, Decreased safety awareness, Decreased strength, Impaired flexibility, Impaired perceived functional ability, Impaired tone, Postural dysfunction, Improper body mechanics, Pain  Visit Diagnosis: Muscle weakness (generalized)  Other abnormalities of gait and mobility  Other lack of coordination  Hemiplegia and hemiparesis following cerebral infarction affecting right dominant side ([facility]     Problem List Patient Active Problem List   Diagnosis Date Noted  . Acute CVA (ce[place]brovascular accident) ([place] 05/02/2016  . Left-sided weakness 05/01/2016    MAlanson Puls PVirginiaDPT 01/11/2019, 4:01 PM  [place] [facility] [address] [place] [address]Phone: [phone]  Fax:  [phone] Name: BSYLAR VOONGMRN: [id number] of Birth: 9August 01, 1967

## 2019-01-11 NOTE — Therapy (Signed)
[place] MAIN [facility] SERVICES [address] [place], [place], [address] Phone: [phone]   Fax:  [phone]  Occupational Therapy Treatment  Patient Details  Name: Curtis Powell MRN: [id number] Date of Birth: 09/24/1965 No data recorded  Encounter Date: 01/11/2019  OT End of Session - 01/11/19 1444    Visit Number  137    Number of Visits  161    Date for OT Re-Evaluation  02/10/19    Authorization Type  Progress reporting period starting 12/09/2018    OT Start Time  1434    OT Stop Time  1515    OT Time Calculation (min)  41 min    Equipment Utilized During Treatment  Long handled shoe horn    Activity Tolerance  Patient tolerated treatment well    Behavior During Therapy  WFL for tasks assessed/performed       Past Medical History:  Diagnosis Date  . GERD (gastroesophageal reflux disease)   . Hyperlipidemia   . Hypertension   . Obesity   . Stroke [facility])     Past Surgical History:  Procedure Laterality Date  . BRAIN SURGERY      There were no vitals filed for this visit.  Subjective Assessment - 01/11/19 1442    Subjective   Pt. reports feeling good.    Patient is accompanied by:  Family member    Pertinent History  Pt. is a 53 y.o. male who suffered a CVA on 05/01/2016. Pt. was admitted to the hospital. Once discharged, he received Home Health PT and OT services for about a month. Pt. has had multiple CVAs over the past 8 years, and has had multiple falls in the past 6 months. Pt. resides in an apartment. Pt. has caregivers for 80 hours. Pt.'s mother stays with pt. at night, and assists with IADL tasks.      Currently in Pain?  No/denies      OT TREATMENT    Neuro muscular re-education:  Pt. worked on RUE reaching using the [employer]. Pt. grasped and moved the shapes through 3 vertical dowels of varying heights. Pt. worked on right [facility] skills grasping, and manipulating 1/2" small circular top pegs. Pt. worked on  grasping, manipulating, and storing the pegs. Pt. Worked on moving the pegs through his hand from his palm to the tip of his 2nd digit, and thumb in preparation for placing them into a pegboard. Pt. dropped multiple pegs when attempting to perform translatory movements with the right hand. Pt. required cues to avoid hiking his right shoulder during [facility] skills.  Therapeutic Exercise:  Pt. performed gross gripping with a grip strengthener. Pt. worked on sustaining gross grip while grasping pegs and reaching at various heights. The Gripper was set at 59.9# of grip strength force.  Response to Treatment:  Pt. continues to present with BUE weakness, limited strength, and limited functional reaching with increased compensation proximally, hiking of the shoulder, and later trunk flexion to the laft. Pt. continues to work on improving UE strength, and [facility] skills in order to improve overall UE functioning during ADLs, and IADL tasks.                      OT Education - 01/11/19 1444    Education provided  Yes    Education Details  UE reaching, Fine motor coordination skills    Person(s) Educated  Patient    Methods  Explanation;Demonstration    Comprehension  Verbalized understanding;Returned demonstration;Verbal cues required       OT Short Term Goals - 03/03/17 1459      OT SHORT TERM GOAL #1   Title  `        OT Long Term Goals - 12/09/18 1311      OT LONG TERM GOAL #2   Title  Pt. will complete self grooming with minA      OT LONG TERM GOAL #4   Title  Pt. will perform self dressing with minA and A/E as needed.    Baseline  Pt. has improved with hiking pants/shorts in standing. ModA socks, and min-modA donning pants over feet. MinA donning a jacket. Indpendent with a T-shirt    Time  12    Period  Weeks    Target Date  02/10/19      OT LONG TERM GOAL #6   Title  Pt. will write his name efficiently with 100% legibility    Baseline  Pt continues to present with  limited legibility and efficiency when writing name to sign in when riding the [place]    Time  12    Period  Weeks    Status  On-going    Target Date  02/10/19      OT LONG TERM GOAL #7   Title  Pt will independently and consistently follow HEP to increase UE strength to increase functional independence    Baseline  Pt to continues to work towards independence with exercises    Time  12    Period  Weeks    Status  On-going    Target Date  02/10/19      OT LONG TERM GOAL #8   Title  Pt. will require supervision ironing a shirt.    Baseline  Pt. requires CGA-minA, and complete set-up seated using a tabletop iron    Time  12    Period  Weeks    Status  On-going    Target Date  02/10/19      OT LONG TERM GOAL  #9   Baseline  Pt. will independently be able to sew a button onto a shirt.    Time  12    Period  Weeks    Status  On-going    Target Date  02/10/19      OT LONG TERM GOAL  #10   TITLE  Pt. will independently be able to throw a ball    Baseline  Pt. continues to be limited    Time  12    Period  Weeks    Target Date  02/10/19      OT LONG TERM GOAL  #11   TITLE  Pt. will improve UE functional reaching to be able to independently use his RUE to hand his clothes in the closest.    Baseline  Pt. continues to have difficulty.    Time  12    Period  Weeks    Status  On-going    Target Date  02/10/19            Plan - 01/11/19 1445    Clinical Impression Statement Pt. reports that he needs to go to the dentist because his teeth are moving when he eats. Pt. continues to present with BUE weakness, limited strength, and limited functional reaching with increased compensation proximally, hiking of the shoulder, and later trunk flexion to the laft. Pt. continues to work on improving UE strength, and [facility] skills in order  to improve overall UE functioning during ADLs, and IADL tasks.    OT Occupational Profile and History  Problem Focused Assessment - Including review of  records relating to presenting problem    Occupational performance deficits (Please refer to evaluation for details):  ADL's;IADL's    Body Structure / Function / Physical Skills  ADL;FMC;ROM;IADL;Pain;Coordination    Rehab Potential  Fair    Clinical Decision Making  Several treatment options, min-mod task modification necessary    OT Frequency  2x / week    OT Duration  12 weeks    OT Treatment/Interventions  Self-care/ADL training;Patient/family education;DME and/or AE instruction;Therapeutic exercise;Moist Heat;Neuromuscular education    Consulted and Agree with Plan of Care  Patient       Patient will benefit from skilled therapeutic intervention in order to improve the following deficits and impairments:   Body Structure / Function / Physical Skills: ADL, FMC, ROM, IADL, Pain, Coordination       Visit Diagnosis: Muscle weakness (generalized)  Other lack of coordination    Problem List Patient Active Problem List   Diagnosis Date Noted  . Acute CVA (cerebrovascular accident) ([place]) 05/02/2016  . Left-sided weakness 05/01/2016    Harrel Carina, MS, OTR/L 01/11/2019, 2:55 PM  [place] MAIN [facility] SERVICES [address] [place], [place], [address] Phone: [phone]   Fax:  [phone]  Name: Curtis Powell MRN: [id number] Date of Birth: Jul 10, 1965

## 2019-01-13 ENCOUNTER — Encounter: Payer: Self-pay | Admitting: Occupational Therapy

## 2019-01-13 ENCOUNTER — Other Ambulatory Visit: Payer: Self-pay

## 2019-01-13 ENCOUNTER — Ambulatory Visit: Payer: Medicare HMO

## 2019-01-13 ENCOUNTER — Ambulatory Visit: Payer: Medicare HMO | Admitting: Occupational Therapy

## 2019-01-13 DIAGNOSIS — M6281 Muscle weakness (generalized): Secondary | ICD-10-CM

## 2019-01-13 DIAGNOSIS — R2689 Other abnormalities of gait and mobility: Secondary | ICD-10-CM

## 2019-01-13 DIAGNOSIS — R278 Other lack of coordination: Secondary | ICD-10-CM

## 2019-01-13 NOTE — Therapy (Signed)
[place] MAIN [facility] SERVICES [address] [place], [place], [address] Phone: [phone]   Fax:  [phone]  Physical Therapy Treatment  Patient Details  Name: Curtis Powell MRN: [id number] Date of Birth: 1965-09-01 No data recorded  Encounter Date: 01/13/2019  PT End of Session - 01/14/19 1934    Visit Number  143    Number of Visits  152    Date for PT Re-Evaluation  02/15/19    Authorization Type  3/10 PN 01/04/19    Authorization Time Period  authorized 11/16/18-05/19/19    PT Start Time  1518    PT Stop Time  1600    PT Time Calculation (min)  42 min    Equipment Utilized During Treatment  Gait belt    Activity Tolerance  Patient tolerated treatment well    Behavior During Therapy  [facility] for tasks assessed/performed       Past Medical History:  Diagnosis Date  . GERD (gastroesophageal reflux disease)   . Hyperlipidemia   . Hypertension   . Obesity   . Stroke [facility])     Past Surgical History:  Procedure Laterality Date  . BRAIN SURGERY      There were no vitals filed for this visit.  Subjective Assessment - 01/14/19 1927    Subjective  Patient reports compliance with HEP. No falls or LOB since last session    Pertinent History   Patient is a pleasant 53 year old male who presents to physical therapy for weakness and immobility secondary to CVA.  Had a stroke in Feb 2018. He was previously fully independent, but this stroke caused severe residual deficits, mainly on the right side as well as speech, and now he is unable to walk or perform most of his ADLs on his own. Entire right side is very weak. He still has a little difficulty with speech but his swallowing is improved to baseline. His mom had to move in with him and now is his main caregiver.     Limitations  Sitting;Lifting;Standing;Walking;House hold activities    How long can you sit comfortably?  5 minutes    How long can you stand comfortably?  5 minutes    How long  can you walk comfortably?  10 ft    Patient Stated Goals  Walk without walker, walk further with walker, get some strength in back     Currently in Pain?  No/denies    Pain Onset  --          Ambulated 110 ft with RW and CGA, w/c follow, cueing to PSIS and ASIS for steppage.   Ambulate with [employer] max AFO on RLE for foot position 30 ft with improved gait mechanics and RW.Improved heel strike with decreased episodes of excessive knee hyperextension allows for improved weight shift and mobility   raised plinth table: Single arm sit to stand 12x, able to perform with no episodes of posterior LOB, cueing required for sequencing.   cross body punches for abdominal activation and postural control x 2 minutes   Cross body punches with ducking and upright posture 2 minutes for abdominal and postural control   Upright posture marching with focus on upright stability with LE movement x 2 minutes   Balloon taps inside/outside BOS without back rest, able to reach outside BOS without LOB and return to upright position, more challenging to the R x 2 minutes    GTB hamstring curls 10x each LE  Modified windmills seated 8x each side with focus on upright posture   Patient educated on donning/doffing and use of AFO and will benefit from continued education.    Pt educated throughout session about proper posture and technique with exercises. Improved exercise technique, movement at target joints, use of target muscles after min to mod verbal, visual, tactile cues                  PT Education - 01/14/19 1934    Education provided  Yes    Education Details  exercise technique, body mechanics, AFO    Person(s) Educated  Patient    Methods  Explanation;Demonstration;Tactile cues;Verbal cues    Comprehension  Verbalized understanding;Returned demonstration;Verbal cues required;Tactile cues required       PT Short Term Goals - 12/22/18 1049      PT SHORT TERM GOAL #1   Title   Patient will be independent in home exercise program to improve strength/mobility for better functional independence with ADLs.    Baseline  hep compliant    Time  2    Period  Weeks    Status  Achieved      PT SHORT TERM GOAL #2   Title  Patient will require min cueing for STS transfer with CGA for increased independence with mobility.     Baseline  CGA    Time  2    Period  Weeks    Status  Achieved      PT SHORT TERM GOAL #3   Title  Patient will maintain upright posture for > 15 seconds to demonstrate strengthened postural control muscualture     Baseline  18 seconds     Time  2    Period  Weeks    Status  Achieved        PT Long Term Goals - 12/22/18 0001      PT LONG TERM GOAL #1   Title  Patient will perform 10 MWT in < 1 minute to demonstrate improved ambulatory velocity and capacity.     Baseline  4/10: 2 minutes 45 seconds; 5/29: 2 minute 31 seconds; 3 minutes 22 seconds 8/22: 3 minutes 2 seconds  9/12:  2 minutes and 43 seconds 10/15: 67mnute 12 seconds 11/7: 1 minute 21 seconds 12/19: 1 min 16 seconds 3/3: 57 seconds; 08/26/18: 1:41.4s= 0.10 m/s 7/1: 1 minute 14 seconds 9/14: 61 seconds    Time  8    Period  Weeks    Status  Partially Met    Target Date  02/15/19      PT LONG TERM GOAL #2   Title  Patient (< 674years old) will complete five times sit to stand test in < 10 seconds indicating an increased LE strength and improved balance.    Baseline  5/19: [facility] one hand on walker and one on WC, 80% upright posture 7/1: 22 seconds one hand on walker one hand on w/c  9/14: 53 seconds with full upright posture    Time  8    Period  Weeks    Status  On-going    Target Date  02/15/19      PT LONG TERM GOAL #3   Title  Patient will increase Berg Balance score by > 6 points (26/56) to demonstrate decreased fall risk during functional activities.    Baseline  9/12: 20/56 10/15: 21/56 11/7: 20/56 12/19: 22/56; 08/26/18: 11/56 7/1: 17/56  9/14: 23/56    Time  8     Period  Weeks    Status  Partially Met    Target Date  02/15/19      PT LONG TERM GOAL #4   Title  Patient will increase BLE gross strength to 4+/5 as to improve functional strength for independent gait, increased standing tolerance and increased ADL ability.    Baseline   4-/5 RLE, 10/15: 4/5 RLE 11/7: 4/5 RLE 3/3: 4/5 7/1:  hip flexion 4/5 hip extension 2/5 hip abduction/adduciton 2/5     Time  8    Period  Weeks    Status  On-going    Target Date  02/15/19      PT LONG TERM GOAL #5   Title  Patient will increase lower extremity functional scale to >40/80 to demonstrate improved functional mobility and increased tolerance with ADLs.     Baseline  14/80; 12/26: 21/80; 2/18: 28/80  4/10; 25/80 5/29: 27/80; 7/16: 29/80 8/22: 30/80  9/12: 26/80 (feeling more tired due to moving) 10/15: 37/80 11/7: 23/80 3/3: 32/80; 08/25/18: 28/80 9/15: 32/56    Time  8    Period  Weeks    Status  Partially Met    Target Date  02/15/19      PT LONG TERM GOAL #6   Title  Patient will ambulate 150 ft with least assistive device and Supervision with no breaks to allow for increased mobility within home.    Baseline  11/7: 153f 12/19: 115 ft  3/3: will assess next session, has done 200 ft with one rest break; 08/26/18: Pt fatigued after 30' today 9/14: 100 ft with CGA    Time  8    Period  Weeks    Status  Partially Met    Target Date  02/15/19            Plan - 01/14/19 1937    Clinical Impression Statement  Patient introduced to AFO to RLE and demonstrated improved heel strike with decreased episodes of excessive knee hyperextension allows for improved weight shift and mobility. Further evaluation of need for AFO will benefit patient's mobility and gait mechanics allowing for increased independence with mobility and decreased falls risk. He will continue to benefit from skilled PT to improve mobility and strength    Rehab Potential  Fair    Clinical Impairments Affecting Rehab Potential  hx of  HTN, HLD, CVA, learning disability, Diabetes, brain tumor,     PT Frequency  2x / week    PT Duration  8 weeks    PT Treatment/Interventions  ADLs/Self Care Home Management;Aquatic Therapy;Ultrasound;Moist Heat;Traction;DME Instruction;Gait training;Stair training;Functional mobility training;Therapeutic activities;Therapeutic exercise;Orthotic Fit/Training;Neuromuscular re-education;Balance training;Patient/family education;Manual techniques;Wheelchair mobility training;Passive range of motion;Energy conservation;Taping;Visual/perceptual remediation/compensation    PT Next Visit Plan  progress ambulation for duration    PT Home Exercise Plan  TrA contraction, glute sets: needs updates. Potentially some time in supine flat in hospital bed, to improve hip extension deficits.     Consulted and Agree with Plan of Care  Patient       Patient will benefit from skilled therapeutic intervention in order to improve the following deficits and impairments:  Abnormal gait, Decreased activity tolerance, Decreased balance, Decreased knowledge of precautions, Decreased endurance, Decreased coordination, Decreased knowledge of use of DME, Decreased mobility, Decreased range of motion, Difficulty walking, Decreased safety awareness, Decreased strength, Impaired flexibility, Impaired perceived functional ability, Impaired tone, Postural dysfunction, Improper body mechanics, Pain  Visit Diagnosis: Muscle weakness (generalized)  Other lack of coordination  Other abnormalities of gait and mobility     Problem List Patient Active Problem List   Diagnosis Date Noted  . Acute CVA (cerebrovascular accident) ([place]) 05/02/2016  . Left-sided weakness 05/01/2016   Janna Arch, PT, DPT   01/14/2019, 7:40 PM  [place] MAIN [facility] SERVICES [address] [place], [place], [address] Phone: [phone]   Fax:  [phone]  Name: Curtis Powell MRN: [id number] Date of Birth:  Apr 21, 1965

## 2019-01-13 NOTE — Therapy (Signed)
[place] MAIN [facility] SERVICES [address] [place], [place], [address] Phone: [phone]   Fax:  [phone]  Occupational Therapy Treatment  Patient Details  Name: Curtis Powell MRN: [id number] Date of Birth: 07-24-1965 No data recorded  Encounter Date: 01/13/2019  OT End of Session - 01/13/19 1454    Visit Number  138    Number of Visits  161    Date for OT Re-Evaluation  02/10/19    OT Start Time  1450    OT Stop Time  1515    OT Time Calculation (min)  25 min    Equipment Utilized During Treatment  Long handled shoe horn    Activity Tolerance  Patient tolerated treatment well    Behavior During Therapy  WFL for tasks assessed/performed       Past Medical History:  Diagnosis Date  . GERD (gastroesophageal reflux disease)   . Hyperlipidemia   . Hypertension   . Obesity   . Stroke [facility])     Past Surgical History:  Procedure Laterality Date  . BRAIN SURGERY      There were no vitals filed for this visit.  Subjective Assessment - 01/13/19 1453    Subjective   Pt. reports that he is feeling good overall.    Pertinent History  Pt. is a 53 y.o. male who suffered a CVA on 05/01/2016. Pt. was admitted to the hospital. Once discharged, he received Home Health PT and OT services for about a month. Pt. has had multiple CVAs over the past 8 years, and has had multiple falls in the past 6 months. Pt. resides in an apartment. Pt. has caregivers for 80 hours. Pt.'s mother stays with pt. at night, and assists with IADL tasks.      Patient Stated Goals  To be able to throw a ball and dribble a ball, do as much as I can for myself.     Currently in Pain?  No/denies      OT TREATMENT    Neuro muscular re-education:  Pt. worked on reaching using the shape tower. Pt. Grasped and moved the shapes through 3 vertical dowels of varying heights.  Therapeutic Exercise:  Pt. performed gross gripping with grip strengthener. Pt. worked on  sustaining grip while grasping pegs and reaching at various heights. The gripper was placed at 66.9# of grip strength force. Pt. Presented with increased compensation on the right with grip strengthening tasks. Pt. Required the board to be repositioned at times.   Response to Treatment   Pt. presents with less compensation proximally in the right shoulder, and less leaning with the trunk during reaching tasks. Pt. presents with limited RUE reaching, limited hand function, and [facility] skills. Pt. continues to work on improving UE functioning during ADLs, and IADL tasks.                    OT Education - 01/13/19 1453    Education provided  Yes    Education Details  UE reaching, Fine motor coordination skills    Person(s) Educated  Patient    Methods  Explanation;Demonstration    Comprehension  Verbalized understanding;Returned demonstration;Verbal cues required       OT Short Term Goals - 03/03/17 1459      OT SHORT TERM GOAL #1   Title  `        OT Long Term Goals - 12/09/18 1311      OT LONG TERM GOAL #2  Title  Pt. will complete self grooming with minA      OT LONG TERM GOAL #4   Title  Pt. will perform self dressing with minA and A/E as needed.    Baseline  Pt. has improved with hiking pants/shorts in standing. ModA socks, and min-modA donning pants over feet. MinA donning a jacket. Indpendent with a T-shirt    Time  12    Period  Weeks    Target Date  02/10/19      OT LONG TERM GOAL #6   Title  Pt. will write his name efficiently with 100% legibility    Baseline  Pt continues to present with limited legibility and efficiency when writing name to sign in when riding the [place]    Time  12    Period  Weeks    Status  On-going    Target Date  02/10/19      OT LONG TERM GOAL #7   Title  Pt will independently and consistently follow HEP to increase UE strength to increase functional independence    Baseline  Pt to continues to work towards independence with  exercises    Time  12    Period  Weeks    Status  On-going    Target Date  02/10/19      OT LONG TERM GOAL #8   Title  Pt. will require supervision ironing a shirt.    Baseline  Pt. requires CGA-minA, and complete set-up seated using a tabletop iron    Time  12    Period  Weeks    Status  On-going    Target Date  02/10/19      OT LONG TERM GOAL  #9   Baseline  Pt. will independently be able to sew a button onto a shirt.    Time  12    Period  Weeks    Status  On-going    Target Date  02/10/19      OT LONG TERM GOAL  #10   TITLE  Pt. will independently be able to throw a ball    Baseline  Pt. continues to be limited    Time  12    Period  Weeks    Target Date  02/10/19      OT LONG TERM GOAL  #11   TITLE  Pt. will improve UE functional reaching to be able to independently use his RUE to hand his clothes in the closest.    Baseline  Pt. continues to have difficulty.    Time  12    Period  Weeks    Status  On-going    Target Date  02/10/19            Plan - 01/13/19 1455    Clinical Impression Statement  Pt. presents with less compensation proximally in the right shoulder, and less leaning with the trunk during reaching tasks. Pt. presents with limited RUE reaching, limited hand function, and [facility] skills. Pt. continues to work on improving UE functioning during ADLs, and IADL tasks.    OT Occupational Profile and History  Problem Focused Assessment - Including review of records relating to presenting problem    Occupational performance deficits (Please refer to evaluation for details):  ADL's;IADL's    Rehab Potential  Fair    Clinical Decision Making  Several treatment options, min-mod task modification necessary    OT Frequency  2x / week    OT Duration  12 weeks  OT Treatment/Interventions  Self-care/ADL training;Patient/family education;DME and/or AE instruction;Therapeutic exercise;Moist Heat;Neuromuscular education    Consulted and Agree with Plan of Care   Patient       Patient will benefit from skilled therapeutic intervention in order to improve the following deficits and impairments:           Visit Diagnosis: Muscle weakness (generalized)  Other lack of coordination    Problem List Patient Active Problem List   Diagnosis Date Noted  . Acute CVA (cerebrovascular accident) ([place]) 05/02/2016  . Left-sided weakness 05/01/2016    Harrel Carina, MS, OTR/L 01/13/2019, 3:03 PM  [place] MAIN [facility] SERVICES [address] [place], [place], [address] Phone: [phone]   Fax:  [phone]  Name: AYDIAN EAVEY MRN: [id number] Date of Birth: 1965/09/03

## 2019-01-18 ENCOUNTER — Ambulatory Visit: Payer: Medicare HMO | Admitting: Occupational Therapy

## 2019-01-18 ENCOUNTER — Ambulatory Visit: Payer: Medicare HMO

## 2019-01-18 ENCOUNTER — Encounter: Payer: Self-pay | Admitting: Occupational Therapy

## 2019-01-18 ENCOUNTER — Other Ambulatory Visit: Payer: Self-pay

## 2019-01-18 DIAGNOSIS — R278 Other lack of coordination: Secondary | ICD-10-CM

## 2019-01-18 DIAGNOSIS — R2689 Other abnormalities of gait and mobility: Secondary | ICD-10-CM

## 2019-01-18 DIAGNOSIS — M6281 Muscle weakness (generalized): Secondary | ICD-10-CM | POA: Diagnosis not present

## 2019-01-18 NOTE — Therapy (Signed)
[place] MAIN [facility] SERVICES [address] [place], [place], [address] Phone: [phone]   Fax:  [phone]  Physical Therapy Treatment  Patient Details  Name: Curtis Powell MRN: [id number] Date of Birth: [birth year] No data recorded  Encounter Date: 01/18/2019  PT End of Session - 01/18/19 1840    Visit Number  [id number]    Number of Visits  152    Date for PT Re-Evaluation  02/15/19    Authorization Type  3/10 PN 01/04/19    Authorization Time Period  authorized 11/16/18-05/19/19    PT Start Time  1430    PT Stop Time  1515    PT Time Calculation (min)  45 min    Equipment Utilized During Treatment  Gait belt    Activity Tolerance  Patient tolerated treatment well    Behavior During Therapy  [facility] for tasks assessed/performed       Past Medical History:  Diagnosis Date  . GERD (gastroesophageal reflux disease)   . Hyperlipidemia   . Hypertension   . Obesity   . Stroke [facility])     Past Surgical History:  Procedure Laterality Date  . BRAIN SURGERY      There were no vitals filed for this visit.  Subjective Assessment - 01/18/19 1438    Subjective  Pt reports that he is doing well today.  He reports no pain or soreness today.  He asked if he can start working on stepping over door threshold's in PT to improve function at home with his Aides.  Patient reports compliance with HEP. No falls or LOB since last session    Pertinent History   Patient is a pleasant 53 year old male who presents to physical therapy for weakness and immobility secondary to CVA.  Had a stroke in Feb 2018. He was previously fully independent, but this stroke caused severe residual deficits, mainly on the right side as well as speech, and now he is unable to walk or perform most of his ADLs on his own. Entire right side is very weak. He still has a little difficulty with speech but his swallowing is improved to baseline. His mom had to move in with him and now is his main  caregiver.     Limitations  Sitting;Lifting;Standing;Walking;House hold activities    How long can you sit comfortably?  5 minutes    How long can you stand comfortably?  5 minutes    How long can you walk comfortably?  10 ft    Patient Stated Goals  Walk without walker, walk further with walker, get some strength in back     Currently in Pain?  No/denies        TREATMENT  Ambulated 110 ft with RW and CGA, w/c follow, cueing to PSIS and ASIS for steppage.   Ambulate with [employer] max AFO on RLE for foot position 30 ftx4  with improved gait mechanics and RW  .Improved heel strike with decreased episodes of excessive knee hyperextension allows for improved weight shift and mobility.  Facilitation provided at PSIS for optimal hip rotation and weight shift.     raised plinth table: Single arm sit to stand 12x, able to perform with no episodes of posterior LOB, cueing required for sequencing.   Balloon taps inside/outside BOS without back rest, able to reach outside BOS with 2 instances of LOB  x 3 minutes   Patient educated on donning/doffing and use of AFO and will benefit from  continued education.    Pt educated throughout session about proper posture and technique with exercises. Improved exercise technique, movement at target joints, use of target muscles after min to mod verbal, visual, tactile cues  Pt displays good motivation throughout today's session.  He demonstrates improved gait mechanics while wearing the AFO, displaying improvement in foot clearance, step length, and weight shift, with decreased episodes of knee hyperextension.  He displays improved control with STSs from an elevated mat using a RW, utilizing good hand placement and technique throughout. CGA provided throughout session for safety.  Pt will continue to benefit from skilled PT services to address strength, balance and mobility deficits in order to improve function at home.      PT Short Term Goals -  12/22/18 1049      PT SHORT TERM GOAL #1   Title  Patient will be independent in home exercise program to improve strength/mobility for better functional independence with ADLs.    Baseline  hep compliant    Time  2    Period  Weeks    Status  Achieved      PT SHORT TERM GOAL #2   Title  Patient will require min cueing for STS transfer with CGA for increased independence with mobility.     Baseline  CGA    Time  2    Period  Weeks    Status  Achieved      PT SHORT TERM GOAL #3   Title  Patient will maintain upright posture for > 15 seconds to demonstrate strengthened postural control muscualture     Baseline  18 seconds     Time  2    Period  Weeks    Status  Achieved        PT Long Term Goals - 12/22/18 0001      PT LONG TERM GOAL #1   Title  Patient will perform 10 MWT in < 1 minute to demonstrate improved ambulatory velocity and capacity.     Baseline  4/10: 2 minutes 45 seconds; 5/29: 2 minute 31 seconds; 3 minutes 22 seconds 8/22: 3 minutes 2 seconds  9/12:  2 minutes and 43 seconds 10/15: 109mnute 12 seconds 11/7: 1 minute 21 seconds 12/19: 1 min 16 seconds 3/3: 57 seconds; 08/26/18: 1:41.4s= 0.10 m/s 7/1: 1 minute 14 seconds 9/14: 61 seconds    Time  8    Period  Weeks    Status  Partially Met    Target Date  02/15/19      PT LONG TERM GOAL #2   Title  Patient (< 617years old) will complete five times sit to stand test in < 10 seconds indicating an increased LE strength and improved balance.    Baseline  5/19: 20.6s from [facility] one hand on walker and one on WC, 80% upright posture 7/1: 22 seconds one hand on walker one hand on w/c  9/14: 53 seconds with full upright posture    Time  8    Period  Weeks    Status  On-going    Target Date  02/15/19      PT LONG TERM GOAL #3   Title  Patient will increase Berg Balance score by > 6 points (26/56) to demonstrate decreased fall risk during functional activities.    Baseline  9/12: 20/56 10/15: 21/56 11/7: 20/56 12/19: 22/56;  08/26/18: 11/56 7/1: 17/56  9/14: 23/56    Time  8    Period  Weeks  Status  Partially Met    Target Date  02/15/19      PT LONG TERM GOAL #4   Title  Patient will increase BLE gross strength to 4+/5 as to improve functional strength for independent gait, increased standing tolerance and increased ADL ability.    Baseline   4-/5 RLE, 10/15: 4/5 RLE 11/7: 4/5 RLE 3/3: 4/5 7/1:  hip flexion 4/5 hip extension 2/5 hip abduction/adduciton 2/5     Time  8    Period  Weeks    Status  On-going    Target Date  02/15/19      PT LONG TERM GOAL #5   Title  Patient will increase lower extremity functional scale to >40/80 to demonstrate improved functional mobility and increased tolerance with ADLs.     Baseline  14/80; 12/26: 21/80; 2/18: 28/80  4/10; 25/80 5/29: 27/80; 7/16: 29/80 8/22: 30/80  9/12: 26/80 (feeling more tired due to moving) 10/15: 37/80 11/7: 23/80 3/3: 32/80; 08/25/18: 28/80 9/15: 32/56    Time  8    Period  Weeks    Status  Partially Met    Target Date  02/15/19      PT LONG TERM GOAL #6   Title  Patient will ambulate 150 ft with least assistive device and Supervision with no breaks to allow for increased mobility within home.    Baseline  11/7: 175f 12/19: 115 ft  3/3: will assess next session, has done 200 ft with one rest break; 08/26/18: Pt fatigued after 30' today 9/14: 100 ft with CGA    Time  8    Period  Weeks    Status  Partially Met    Target Date  02/15/19            Plan - 01/18/19 1846    Clinical Impression Statement  Pt displays good motivation throughout today's session.  He demonstrates improved gait mechanics while wearing the AFO, displaying improvement in foot clearance, step length, and weight shift, with decreased episodes of knee hyperextension.  He displays improved control with STSs from an elevated mat using a RW, utilizing good hand placement and technique throughout. CGA provided throughout session for safety.  Pt will continue to benefit from  skilled PT services to address strength, balance and mobility deficits in order to improve function at home.    Rehab Potential  Fair    Clinical Impairments Affecting Rehab Potential  hx of HTN, HLD, CVA, learning disability, Diabetes, brain tumor,     PT Frequency  2x / week    PT Duration  8 weeks    PT Treatment/Interventions  ADLs/Self Care Home Management;Aquatic Therapy;Ultrasound;Moist Heat;Traction;DME Instruction;Gait training;Stair training;Functional mobility training;Therapeutic activities;Therapeutic exercise;Orthotic Fit/Training;Neuromuscular re-education;Balance training;Patient/family education;Manual techniques;Wheelchair mobility training;Passive range of motion;Energy conservation;Taping;Visual/perceptual remediation/compensation    PT Next Visit Plan  progress ambulation for duration    PT Home Exercise Plan  TrA contraction, glute sets: needs updates. Potentially some time in supine flat in hospital bed, to improve hip extension deficits.     Consulted and Agree with Plan of Care  Patient       Patient will benefit from skilled therapeutic intervention in order to improve the following deficits and impairments:  Abnormal gait, Decreased activity tolerance, Decreased balance, Decreased knowledge of precautions, Decreased endurance, Decreased coordination, Decreased knowledge of use of DME, Decreased mobility, Decreased range of motion, Difficulty walking, Decreased safety awareness, Decreased strength, Impaired flexibility, Impaired perceived functional ability, Impaired tone, Postural dysfunction, Improper body mechanics,  Pain  Visit Diagnosis: Muscle weakness (generalized)  Other lack of coordination  Other abnormalities of gait and mobility     Problem List Patient Active Problem List   Diagnosis Date Noted  . Acute CVA (cerebrovascular accident) ([place]) 05/02/2016  . Left-sided weakness 05/01/2016    This entire session was performed under direct supervision and  direction of a licensed therapist/therapist assistant . I have personally read, edited and approve of the note as written.   Lutricia Horsfall, SPT [facility] Grout PT, DPT, GCS  Huprich,Jason 01/19/2019, 3:19 PM  [place] MAIN [facility] SERVICES [address] [place], [place], [address] Phone: [phone]   Fax:  [phone]  Name: Curtis Powell MRN: [id number] Date of Birth: January 11, 1966

## 2019-01-18 NOTE — Therapy (Signed)
[place] MAIN [facility] SERVICES [address] [place], [place], [address] Phone: [phone]   Fax:  [phone]  Occupational Therapy Treatment  Patient Details  Name: Curtis Powell MRN: [id number] Date of Birth: 19-Jul-1965 No data recorded  Encounter Date: 01/18/2019  OT End of Session - 01/18/19 1525    Visit Number  139    Number of Visits  161    Date for OT Re-Evaluation  02/10/19    OT Start Time  1515    OT Stop Time  1600    OT Time Calculation (min)  45 min    Equipment Utilized During Treatment  Long handled shoe horn    Activity Tolerance  Patient tolerated treatment well    Behavior During Therapy  WFL for tasks assessed/performed       Past Medical History:  Diagnosis Date  . GERD (gastroesophageal reflux disease)   . Hyperlipidemia   . Hypertension   . Obesity   . Stroke [facility])     Past Surgical History:  Procedure Laterality Date  . BRAIN SURGERY      There were no vitals filed for this visit.  Subjective Assessment - 01/18/19 1523    Subjective   Pt. reports that he is doing well today.    Patient is accompanied by:  Family member    Pertinent History  Pt. is a 53 y.o. male who suffered a CVA on 05/01/2016. Pt. was admitted to the hospital. Once discharged, he received Home Health PT and OT services for about a month. Pt. has had multiple CVAs over the past 8 years, and has had multiple falls in the past 6 months. Pt. resides in an apartment. Pt. has caregivers for 80 hours. Pt.'s mother stays with pt. at night, and assists with IADL tasks.      Patient Stated Goals  To be able to throw a ball and dribble a ball, do as much as I can for myself.     Currently in Pain?  No/denies      OT TREATMENT    Neuro muscular re-education:  Pt. worked on reaching using the shape tower. Pt. grasped and moved the shapes through 3 vertical dowels of varying heights. Pt. presented with less compensation proximally. Pt.  performed [facility] tasks using the Grooved pegboard. Pt. worked on grasping the grooved pegs from a horizontal position, and moving the pegs to a vertical position in the hand to prepare for placing them in the grooved slot.  Pt. Worked on removing the the pegs while alternating thumb opposition to the tip of his 2nd, 3rd, and 4th digits.   Self-care:  Pt. Worked on Building surveyor, legibility, word, and letter formation in printed form. Pt. Copied one and two syllable words with 75% legibility initially, with legibility decreasing to 50% using large print. Some words deviate below the line, with decreased spacing.  Pt. Prints with the paper positioned sideways.  Response to Treatment  Pt. has made progress overall, and is now using his hands more during ADL, and IADL tasks at home. Pt. is engaging more during ADLs, and IADL tasks. Pt. is now able to use his RUE for spreading condiments when making sandwiches. Pt. has difficulty with writing messages, and formulating words. Pt. continues to work on improving RUE functioning during ADLs, and IADL as well as writing legibility, word and letter formation.  OT Education - 01/18/19 1524    Education provided  Yes    Education Details  UE reaching, Fine motor coordination skills    Person(s) Educated  Patient    Methods  Explanation;Demonstration    Comprehension  Verbalized understanding;Returned demonstration;Verbal cues required       OT Short Term Goals - 03/03/17 1459      OT SHORT TERM GOAL #1   Title  `        OT Long Term Goals - 12/09/18 1311      OT LONG TERM GOAL #2   Title  Pt. will complete self grooming with minA      OT LONG TERM GOAL #4   Title  Pt. will perform self dressing with minA and A/E as needed.    Baseline  Pt. has improved with hiking pants/shorts in standing. ModA socks, and min-modA donning pants over feet. MinA donning a jacket. Indpendent with a T-shirt    Time  12    Period   Weeks    Target Date  02/10/19      OT LONG TERM GOAL #6   Title  Pt. will write his name efficiently with 100% legibility    Baseline  Pt continues to present with limited legibility and efficiency when writing name to sign in when riding the [place]    Time  12    Period  Weeks    Status  On-going    Target Date  02/10/19      OT LONG TERM GOAL #7   Title  Pt will independently and consistently follow HEP to increase UE strength to increase functional independence    Baseline  Pt to continues to work towards independence with exercises    Time  12    Period  Weeks    Status  On-going    Target Date  02/10/19      OT LONG TERM GOAL #8   Title  Pt. will require supervision ironing a shirt.    Baseline  Pt. requires CGA-minA, and complete set-up seated using a tabletop iron    Time  12    Period  Weeks    Status  On-going    Target Date  02/10/19      OT LONG TERM GOAL  #9   Baseline  Pt. will independently be able to sew a button onto a shirt.    Time  12    Period  Weeks    Status  On-going    Target Date  02/10/19      OT LONG TERM GOAL  #10   TITLE  Pt. will independently be able to throw a ball    Baseline  Pt. continues to be limited    Time  12    Period  Weeks    Target Date  02/10/19      OT LONG TERM GOAL  #11   TITLE  Pt. will improve UE functional reaching to be able to independently use his RUE to hand his clothes in the closest.    Baseline  Pt. continues to have difficulty.    Time  12    Period  Weeks    Status  On-going    Target Date  02/10/19            Plan - 01/18/19 1525    Clinical Impression Statement Pt. has made progress overall, and is now using his hands more during ADL, and IADL tasks  at home. Pt. is engaging more during ADLs, and IADL tasks. Pt. is now able to use his RUE for spreading condiments when making sandwiches. Pt. has difficulty with writing messages, and formulating words. Pt. continues to work on improving RUE functioning  during ADLs, and IADL as well as writing legibility, word and letter formation.      OT Occupational Profile and History  Problem Focused Assessment - Including review of records relating to presenting problem    Occupational performance deficits (Please refer to evaluation for details):  ADL's;IADL's    Body Structure / Function / Physical Skills  ADL;FMC;ROM;IADL;Pain;Coordination    Rehab Potential  Fair    Clinical Decision Making  Several treatment options, min-mod task modification necessary    OT Frequency  2x / week    OT Duration  12 weeks    OT Treatment/Interventions  Self-care/ADL training;Patient/family education;DME and/or AE instruction;Therapeutic exercise;Moist Heat;Neuromuscular education    Consulted and Agree with Plan of Care  Patient       Patient will benefit from skilled therapeutic intervention in order to improve the following deficits and impairments:   Body Structure / Function / Physical Skills: ADL, FMC, ROM, IADL, Pain, Coordination       Visit Diagnosis: Muscle weakness (generalized)  Other lack of coordination    Problem List Patient Active Problem List   Diagnosis Date Noted  . Acute CVA (cerebrovascular accident) ([place]) 05/02/2016  . Left-sided weakness 05/01/2016    Harrel Carina, MS, OTR/L 01/18/2019, 3:50 PM  [place] MAIN [facility] SERVICES [address] [place], [place], [address] Phone: [phone]   Fax:  [phone]  Name: Curtis Powell MRN: [id number] Date of Birth: Jul 29, 1965

## 2019-01-20 ENCOUNTER — Ambulatory Visit: Payer: Medicare HMO

## 2019-01-20 ENCOUNTER — Other Ambulatory Visit: Payer: Self-pay

## 2019-01-20 ENCOUNTER — Encounter: Payer: Self-pay | Admitting: Physical Therapy

## 2019-01-20 ENCOUNTER — Encounter: Payer: Self-pay | Admitting: Occupational Therapy

## 2019-01-20 ENCOUNTER — Ambulatory Visit: Payer: Medicare HMO | Admitting: Occupational Therapy

## 2019-01-20 ENCOUNTER — Ambulatory Visit: Payer: Medicare HMO | Admitting: Physical Therapy

## 2019-01-20 DIAGNOSIS — R278 Other lack of coordination: Secondary | ICD-10-CM

## 2019-01-20 DIAGNOSIS — M6281 Muscle weakness (generalized): Secondary | ICD-10-CM

## 2019-01-20 DIAGNOSIS — R2689 Other abnormalities of gait and mobility: Secondary | ICD-10-CM

## 2019-01-20 NOTE — Therapy (Signed)
[place] MAIN [facility] SERVICES [address] [place], [place], [address] Phone: [phone]   Fax:  [phone]  Occupational Therapy Progress Note  Dates of reporting period  12/09/2018   to   01/20/2019  Patient Details  Name: Curtis Powell MRN: [id number] Date of Birth: 11-Jan-1966 No data recorded  Encounter Date: 01/20/2019  OT End of Session - 01/20/19 1442    Visit Number  140    Number of Visits  161    Date for OT Re-Evaluation  02/10/19    Authorization Type  Progress reporting period starting 12/09/2018    OT Start Time  1430    OT Stop Time  1515    OT Time Calculation (min)  45 min    Equipment Utilized During Treatment  Long handled shoe horn    Activity Tolerance  Patient tolerated treatment well    Behavior During Therapy  WFL for tasks assessed/performed       Past Medical History:  Diagnosis Date  . GERD (gastroesophageal reflux disease)   . Hyperlipidemia   . Hypertension   . Obesity   . Stroke [facility])     Past Surgical History:  Procedure Laterality Date  . BRAIN SURGERY      There were no vitals filed for this visit.  Subjective Assessment - 01/20/19 1440    Subjective   Pt. reports that he doesnt sleep all that well at night, and needs a new mattress.    Patient is accompanied by:  Family member    Pertinent History  Pt. is a 53 y.o. male who suffered a CVA on 05/01/2016. Pt. was admitted to the hospital. Once discharged, he received Home Health PT and OT services for about a month. Pt. has had multiple CVAs over the past 8 years, and has had multiple falls in the past 6 months. Pt. resides in an apartment. Pt. has caregivers for 80 hours. Pt.'s mother stays with pt. at night, and assists with IADL tasks.      Currently in Pain?  No/denies      OT TREATMENT    Neuro muscular re-education:  Pt. worked on [facility] skills grasping 1" sticks, 1/4" collars, and 1/4" washers. Pt. worked on storing the objects in the  palm, and translatory skills moving the items from the palm of the hand to the tip of the 2nd digit, and thumb. Pt. worked on removing the pegs using bilateral alternating hand patterns. Pt. required increased time to complete, and rest breaks. Pt. dropped several small objects from the palm of his hand when attempting store the objects. Pt. performed [facility] tasks using the Grooved pegboard. Pt. worked on grasping the grooved pegs from a horizontal position, and moving the pegs to a vertical position in the hand to prepare for placing them in the grooved slot.   Response to Treatment:  Pt. has made progress overall, and is now using his hands more during ADL, and IADL tasks. Pt. reports that he is now able to use his hands for making sandwiches, opening doors, picking up small objects, and perfroming toilet hygiene skills. Pt. continues to work on improving UE functioning in order to improve UE engagement during ADLs, and IADL tasks.                           OT Education - 01/20/19 1442    Education provided  Yes    Education Details  UE reaching, Fine motor coordination skills    Person(s) Educated  Patient    Methods  Explanation;Demonstration    Comprehension  Verbalized understanding;Returned demonstration;Verbal cues required       OT Short Term Goals - 03/03/17 1459      OT SHORT TERM GOAL #1   Title  `        OT Long Term Goals - 01/20/19 1444      OT LONG TERM GOAL #4   Title  Pt. will perform self dressing with minA and A/E as needed.    Baseline  Pt. has improved with hiking pants/shorts in standing. ModA socks, and min-modA donning pants over feet. MinA donning a jacket. Indpendent with a T-shirt    Time  12    Period  Weeks    Status  On-going    Target Date  02/10/19      OT LONG TERM GOAL #6   Title  Pt. will write his name efficiently with 100% legibility    Baseline  Pt continues to present with limited legibility and efficiency when writing  name to sign in when riding the [place]    Time  12    Period  Weeks    Status  On-going    Target Date  02/10/19      OT LONG TERM GOAL #7   Title  Pt will independently and consistently follow HEP to increase UE strength to increase functional independence    Baseline  Pt to continues to work towards independence with exercises    Time  12    Period  Weeks    Status  On-going    Target Date  02/10/19      OT LONG TERM GOAL #8   Title  Pt. will require supervision ironing a shirt.    Baseline  Pt. requires CGA-minA, and complete set-up seated using a tabletop iron    Time  12    Status  On-going    Target Date  02/10/19      OT LONG TERM GOAL  #9   Baseline  Pt. will independently be able to sew a button onto a shirt.    Time  12    Period  Weeks    Status  On-going    Target Date  02/10/19      OT LONG TERM GOAL  #10   TITLE  Pt. will independently be able to throw a ball    Baseline  Pt. continues to be limited    Time  12    Period  Weeks    Status  On-going    Target Date  02/10/19      OT LONG TERM GOAL  #11   TITLE  Pt. will improve UE functional reaching to be able to independently use his RUE to hand his clothes in the closest.    Baseline  Pt. continues to have difficulty.    Time  12    Period  Weeks    Status  On-going    Target Date  02/10/19            Plan - 01/20/19 1442    Clinical Impression Statement Pt. has made progress overall, and is now using his hands more during ADL, and IADL tasks. Pt. reports that he is now able to use his hands for making sandwiches, opening doors, picking up small objects, and perfroming toilet hygiene skills. Pt. continues to work on improving UE functioning  in order to improve UE engagement during ADLs, and IADL tasks.    OT Occupational Profile and History  Problem Focused Assessment - Including review of records relating to presenting problem    Occupational performance deficits (Please refer to evaluation for  details):  ADL's;IADL's    Body Structure / Function / Physical Skills  ADL;FMC;ROM;IADL;Pain;Coordination    Rehab Potential  Fair    Clinical Decision Making  Several treatment options, min-mod task modification necessary    OT Frequency  2x / week    OT Duration  12 weeks    OT Treatment/Interventions  Self-care/ADL training;Patient/family education;DME and/or AE instruction;Therapeutic exercise;Moist Heat;Neuromuscular education    Consulted and Agree with Plan of Care  Patient       Patient will benefit from skilled therapeutic intervention in order to improve the following deficits and impairments:   Body Structure / Function / Physical Skills: ADL, FMC, ROM, IADL, Pain, Coordination       Visit Diagnosis: Muscle weakness (generalized)  Other lack of coordination    Problem List Patient Active Problem List   Diagnosis Date Noted  . Acute CVA (cerebrovascular accident) ([place]) 05/02/2016  . Left-sided weakness 05/01/2016    Harrel Carina, MS, OTR/L 01/20/2019, 2:59 PM  [place] MAIN [facility] SERVICES [address] [place], [place], [address] Phone: [phone]   Fax:  [phone]  Name: MANSA STAMATIS MRN: [id number] Date of Birth: 1966/02/04

## 2019-01-20 NOTE — Therapy (Signed)
[place] MAIN [facility] SERVICES [address] [place], [place], [address] Phone: [phone]   Fax:  [phone]  Physical Therapy Treatment  Patient Details  Name: Curtis Powell MRN: [id number] Date of Birth: [birth year] No data recorded  Encounter Date: 01/20/2019  PT End of Session - 01/20/19 1454    Visit Number  145    Number of Visits  152    Date for PT Re-Evaluation  02/15/19    Authorization Type  3/10 PN 01/04/19    Authorization Time Period  authorized 11/16/18-05/19/19    PT Start Time  1515    PT Stop Time  1600    PT Time Calculation (min)  45 min    Equipment Utilized During Treatment  Gait belt    Activity Tolerance  Patient tolerated treatment well    Behavior During Therapy  [name] for tasks assessed/performed       Past Medical History:  Diagnosis Date  . GERD (gastroesophageal reflux disease)   . Hyperlipidemia   . Hypertension   . Obesity   . Stroke [facility])     Past Surgical History:  Procedure Laterality Date  . BRAIN SURGERY      There were no vitals filed for this visit. This entire session was performed under direct supervision and direction of a licensed therapist/therapist assistant . I have personally read, edited and approve of the note as written. Subjective Assessment - 01/20/19 1508    Subjective  Patient reports that he is doing well today, no reports of pain or soreness. No new falls or LOB since last session.    Pertinent History   Patient is a pleasant 53 year old male who presents to physical therapy for weakness and immobility secondary to CVA.  Had a stroke in Feb 2018. He was previously fully independent, but this stroke caused severe residual deficits, mainly on the right side as well as speech, and now he is unable to walk or perform most of his ADLs on his own. Entire right side is very weak. He still has a little difficulty with speech but his swallowing is improved to baseline. His mom had to move  in with him and now is his main caregiver.     Limitations  Sitting;Lifting;Standing;Walking;House hold activities    How long can you sit comfortably?  5 minutes    How long can you stand comfortably?  5 minutes    How long can you walk comfortably?  10 ft    Patient Stated Goals  Walk without walker, walk further with walker, get some strength in back     Currently in Pain?  No/denies    Pain Score  0-No pain    Multiple Pain Sites  No       Neuro Re-ed: Patient requires CGA during all standing interventions due to limited stability and patient fear of LOB. Cueing for reduction of UE support required as well as postural alignment for optimal stability within COM    Treatment: -Nu-step x 5 mins BUE and BLE  -Single arm sit to stand 5x, able to perform with no episodes of posterior LOB, cueing required for sequencing.   -Ambulate with [employer] max AFO on RLE for foot position 70 ft x2 on carpet with improved gait mechanics and RW, CGA  Improved heel strike with decreased episodes of excessive knee hyperextension allows for improved weight shift and mobility.  Facilitation provided at PSIS for optimal hip rotation and weight shift.  Pt educated throughout session about proper posture and technique with exercises. Improved exercise technique, movement at target joints, use of target muscles after min to mod verbal, visual, tactile cues.   Patient demonstrates good motivation throughout today's session. Patient was able to ambulate with RLE AFO for an increased distance compared to last session with noticeable decrease in step length and left foot drag once fatigued, required W/C follow, mod TC to PSIS and ASIS for hip rotation, and intermittent breaks. Patient continues to be challenged by fatigue and occasional LBP from flexed posture, which improved with seated rest breaks.      PT Education - 01/20/19 1454    Education provided  Yes    Education Details  exercise technique, body  mechanics, AFO    Person(s) Educated  Patient    Methods  Explanation;Demonstration;Tactile cues;Verbal cues    Comprehension  Verbalized understanding;Returned demonstration;Verbal cues required;Tactile cues required       PT Short Term Goals - 12/22/18 1049      PT SHORT TERM GOAL #1   Title  Patient will be independent in home exercise program to improve strength/mobility for better functional independence with ADLs.    Baseline  hep compliant    Time  2    Period  Weeks    Status  Achieved      PT SHORT TERM GOAL #2   Title  Patient will require min cueing for STS transfer with CGA for increased independence with mobility.     Baseline  CGA    Time  2    Period  Weeks    Status  Achieved      PT SHORT TERM GOAL #3   Title  Patient will maintain upright posture for > 15 seconds to demonstrate strengthened postural control muscualture     Baseline  18 seconds     Time  2    Period  Weeks    Status  Achieved        PT Long Term Goals - 12/22/18 0001      PT LONG TERM GOAL #1   Title  Patient will perform 10 MWT in < 1 minute to demonstrate improved ambulatory velocity and capacity.     Baseline  4/10: 2 minutes 45 seconds; 5/29: 2 minute 31 seconds; 3 minutes 22 seconds 8/22: 3 minutes 2 seconds  9/12:  2 minutes and 43 seconds 10/15: 34mnute 12 seconds 11/7: 1 minute 21 seconds 12/19: 1 min 16 seconds 3/3: 57 seconds; 08/26/18: 1:41.4s= 0.10 m/s 7/1: 1 minute 14 seconds 9/14: 61 seconds    Time  8    Period  Weeks    Status  Partially Met    Target Date  02/15/19      PT LONG TERM GOAL #2   Title  Patient (< 741years old) will complete five times sit to stand test in < 10 seconds indicating an increased LE strength and improved balance.    Baseline  [facility]19[facility] hand on walker and one on WC, 80% upright posture 7/1: 22 seconds one hand on walker one hand on w/c  9/14: 53 seconds with full upright posture    Time  8    Period  Weeks    Status  On-going     Target Date  02/15/19      PT LONG TERM GOAL #3   Title  Patient will increase Berg Balance score by > 6 points (26/56) to demonstrate decreased  fall risk during functional activities.    Baseline  9/12: 20/56 10/15: 21/56 11/7: 20/56 12/19: 22/56; 08/26/18: 11/56 7/1: 17/56  9/14: 23/56    Time  8    Period  Weeks    Status  Partially Met    Target Date  02/15/19      PT LONG TERM GOAL #4   Title  Patient will increase BLE gross strength to 4+/5 as to improve functional strength for independent gait, increased standing tolerance and increased ADL ability.    Baseline   4-/5 RLE, 10/15: 4/5 RLE 11/7: 4/5 RLE 3/3: 4/5 7/1:  hip flexion 4/5 hip extension 2/5 hip abduction/adduciton 2/5     Time  8    Period  Weeks    Status  On-going    Target Date  02/15/19      PT LONG TERM GOAL #5   Title  Patient will increase lower extremity functional scale to >40/80 to demonstrate improved functional mobility and increased tolerance with ADLs.     Baseline  14/80; 12/26: 21/80; 2/18: 28/80  4/10; 25/80 5/29: 27/80; 7/16: 29/80 8/22: 30/80  9/12: 26/80 (feeling more tired due to moving) 10/15: 37/80 11/7: 23/80 3/3: 32/80; 08/25/18: 28/80 9/15: 32/56    Time  8    Period  Weeks    Status  Partially Met    Target Date  02/15/19      PT LONG TERM GOAL #6   Title  Patient will ambulate 150 ft with least assistive device and Supervision with no breaks to allow for increased mobility within home.    Baseline  11/7: 178f 12/19: 115 ft  3/3: will assess next session, has done 200 ft with one rest break; 08/26/18: Pt fatigued after 30' today 9/14: 100 ft with CGA    Time  8    Period  Weeks    Status  Partially Met    Target Date  02/15/19            Plan - 01/20/19 1821    Clinical Impression Statement  Patient demonstrates good motivation throughout today's session. Patient was able to ambulate on carpet surface,  with R AFO for an increased distance compared to last session with noticeable  decrease in step length and left foot drag once fatigued, required W/C follow, mod TC to PSIS and ASIS for hip rotation, and intermittent breaks. Patient continues to be challenged by fatigue and occasional LBP from flexed posture, which improved with seated rest breaks. Patient would continue to benefit from skilled PT to address the deficits outlined in this note.    Rehab Potential  Fair    Clinical Impairments Affecting Rehab Potential  hx of HTN, HLD, CVA, learning disability, Diabetes, brain tumor,     PT Frequency  2x / week    PT Duration  8 weeks    PT Treatment/Interventions  ADLs/Self Care Home Management;Aquatic Therapy;Ultrasound;Moist Heat;Traction;DME Instruction;Gait training;Stair training;Functional mobility training;Therapeutic activities;Therapeutic exercise;Orthotic Fit/Training;Neuromuscular re-education;Balance training;Patient/family education;Manual techniques;Wheelchair mobility training;Passive range of motion;Energy conservation;Taping;Visual/perceptual remediation/compensation    PT Next Visit Plan  progress ambulation for duration    PT Home Exercise Plan  TrA contraction, glute sets: needs updates. Potentially some time in supine flat in hospital bed, to improve hip extension deficits.     Consulted and Agree with Plan of Care  Patient       Patient will benefit from skilled therapeutic intervention in order to improve the following deficits and impairments:  Abnormal gait,  Decreased activity tolerance, Decreased balance, Decreased knowledge of precautions, Decreased endurance, Decreased coordination, Decreased knowledge of use of DME, Decreased mobility, Decreased range of motion, Difficulty walking, Decreased safety awareness, Decreased strength, Impaired flexibility, Impaired perceived functional ability, Impaired tone, Postural dysfunction, Improper body mechanics, Pain  Visit Diagnosis: Muscle weakness (generalized)  Other lack of coordination  Other  abnormalities of gait and mobility     Problem List Patient Active Problem List   Diagnosis Date Noted  . Acute CVA (cerebrovascular accident) ([place]) 05/02/2016  . Left-sided weakness 05/01/2016   Jeneen Rinks A. [address] SPT  [place], [place]ette [place] S 01/21/2019, 10:37 AM Alanson Puls, PT, DPT [place] MAIN [facility] SERVICES [address] [place], [place], [address] Phone: [phone]   Fax:  [phone]  Name: KIEGAN MACARAEG MRN: [id number] Date of Birth: 1965-09-22

## 2019-01-21 ENCOUNTER — Encounter: Payer: Self-pay | Admitting: Physical Therapy

## 2019-01-25 ENCOUNTER — Other Ambulatory Visit: Payer: Self-pay

## 2019-01-25 ENCOUNTER — Encounter: Payer: Self-pay | Admitting: Occupational Therapy

## 2019-01-25 ENCOUNTER — Ambulatory Visit: Payer: Medicare HMO | Admitting: Occupational Therapy

## 2019-01-25 ENCOUNTER — Ambulatory Visit: Payer: Medicare HMO

## 2019-01-25 DIAGNOSIS — M6281 Muscle weakness (generalized): Secondary | ICD-10-CM

## 2019-01-25 DIAGNOSIS — R278 Other lack of coordination: Secondary | ICD-10-CM

## 2019-01-25 DIAGNOSIS — R2689 Other abnormalities of gait and mobility: Secondary | ICD-10-CM

## 2019-01-25 DIAGNOSIS — I69351 Hemiplegia and hemiparesis following cerebral infarction affecting right dominant side: Secondary | ICD-10-CM

## 2019-01-25 NOTE — Therapy (Signed)
[place] MAIN [facility] SERVICES [address] [place], [place], [address] Phone: [phone]   Fax:  [phone]  Physical Therapy Treatment  Patient Details  Name: Curtis Powell MRN: [id number] Date of Birth: 16-Feb-1966 No data recorded  Encounter Date: 01/25/2019  PT End of Session - 01/25/19 1510    Visit Number  [id number]    Number of Visits  152    Date for PT Re-Evaluation  02/15/19    Authorization Type  6/10 PN 01/04/19    Authorization Time Period  authorized 11/16/18-05/19/19    PT Start Time  9518    PT Stop Time  1559    PT Time Calculation (min)  44 min    Equipment Utilized During Treatment  Gait belt    Activity Tolerance  Patient tolerated treatment well    Behavior During Therapy  [facility] for tasks assessed/performed       Past Medical History:  Diagnosis Date  . GERD (gastroesophageal reflux disease)   . Hyperlipidemia   . Hypertension   . Obesity   . Stroke [facility])     Past Surgical History:  Procedure Laterality Date  . BRAIN SURGERY      There were no vitals filed for this visit.    TREATMENT   Donning/doffing [employer] max AFO on RLE: patient requires mod/max assistance for donning/doffing. Will benefit from continued education on task.    Ambulate 100 ft with [employer] max AFO on RLE for foot position  with improved gait mechanics and RW  .Improved heel strike with decreased episodes of excessive knee hyperextension allows for improved weight shift and mobility. Facilitation provided at PSIS for optimal hip rotation and weight shift.      Standing: with AFO and use of RW 2.5lb ankle weights: -marching 10x each LE; cueing for upright posture -hip extension 10x each LE  Seated in wheelchair: use of AFO on RLE Single arm sit to stand 12x, able to perform with no episodes of posterior LOB, cueing required for sequencing.    With wheelchair: posterior negotiation of wheelchair over doorway elevated threshold , using  wheelchair to keep door open while negotiating ; patient demonstrated understanding x 2 trials  Seated: Gluteal squeezes 10x 5 second holds  Balloon taps inside/outside BOS without back rest, able to reach outside BOS with 2 instances of LOB  x 3 minutes     Pt educated throughout session about proper posture and technique with exercises. Improved exercise technique, movement at target joints, use of target muscles after min to mod verbal, visual, tactile cues                          PT Education - 01/25/19 1509    Education provided  Yes    Education Details  AFO, exercise technique, body mechanics    Person(s) Educated  Patient    Methods  Explanation;Demonstration;Tactile cues;Verbal cues    Comprehension  Verbalized understanding;Returned demonstration;Verbal cues required;Tactile cues required       PT Short Term Goals - 12/22/18 1049      PT SHORT TERM GOAL #1   Title  Patient will be independent in home exercise program to improve strength/mobility for better functional independence with ADLs.    Baseline  hep compliant    Time  2    Period  Weeks    Status  Achieved      PT SHORT TERM GOAL #2  Title  Patient will require min cueing for STS transfer with CGA for increased independence with mobility.     Baseline  CGA    Time  2    Period  Weeks    Status  Achieved      PT SHORT TERM GOAL #3   Title  Patient will maintain upright posture for > 15 seconds to demonstrate strengthened postural control muscualture     Baseline  18 seconds     Time  2    Period  Weeks    Status  Achieved        PT Long Term Goals - 12/22/18 0001      PT LONG TERM GOAL #1   Title  Patient will perform 10 MWT in < 1 minute to demonstrate improved ambulatory velocity and capacity.     Baseline  4/10: 2 minutes 45 seconds; 5/29: 2 minute 31 seconds; 3 minutes 22 seconds 8/22: 3 minutes 2 seconds  9/12:  2 minutes and 43 seconds 10/15: 29mnute 12 seconds 11/7: 1  minute 21 seconds 12/19: 1 min 16 seconds 3/3: 57 seconds; 08/26/18: 1:41.4s= 0.10 m/s 7/1: 1 minute 14 seconds 9/14: 61 seconds    Time  8    Period  Weeks    Status  Partially Met    Target Date  02/15/19      PT LONG TERM GOAL #2   Title  Patient (< 641years old) will complete five times sit to stand test in < 10 seconds indicating an increased LE strength and improved balance.    [facility]  [facility] and one on [facility], 80% upright posture 7/1: 22 seconds one hand on walker one hand on w/c  9/14: 53 seconds with full upright posture    Time  8    Period  Weeks    Status  On-going    Target Date  02/15/19      PT LONG TERM GOAL #3   Title  Patient will increase Berg Balance score by > 6 points (26/56) to demonstrate decreased fall risk during functional activities.    Baseline  9/12: 20/56 10/15: 21/56 11/7: 20/56 12/19: 22/56; 08/26/18: 11/56 7/1: 17/56  9/14: 23/56    Time  8    Period  Weeks    Status  Partially Met    Target Date  02/15/19      PT LONG TERM GOAL #4   Title  Patient will increase BLE gross strength to 4+/5 as to improve functional strength for independent gait, increased standing tolerance and increased ADL ability.    Baseline   4-/5 RLE, 10/15: 4/5 RLE 11/7: 4/5 RLE 3/3: 4/5 7/1:  hip flexion 4/5 hip extension 2/5 hip abduction/adduciton 2/5     Time  8    Period  Weeks    Status  On-going    Target Date  02/15/19      PT LONG TERM GOAL #5   Title  Patient will increase lower extremity functional scale to >40/80 to demonstrate improved functional mobility and increased tolerance with ADLs.     Baseline  14/80; 12/26: 21/80; 2/18: 28/80  4/10; 25/80 5/29: 27/80; 7/16: 29/80 8/22: 30/80  9/12: 26/80 (feeling more tired due to moving) 10/15: 37/80 11/7: 23/80 3/3: 32/80; 08/25/18: 28/80 9/15: 32/56    Time  8    Period  Weeks    Status  Partially Met    Target Date  02/15/19      PT LONG TERM GOAL #6   Title  Patient will ambulate 150  ft with least assistive device and Supervision with no breaks to allow for increased mobility within home.    Baseline  11/7: 149f 12/19: 115 ft  3/3: will assess next session, has done 200 ft with one rest break; 08/26/18: Pt fatigued after 30' today 9/14: 100 ft with CGA    Time  8    Period  Weeks    Status  Partially Met    Target Date  02/15/19            Plan - 01/25/19 1601    Clinical Impression Statement  Patient demonstrates improved gait mechanics and stability with use of AFO on RLE. Decreased episodes of R knee hyperextension in addition to improved LE clearance noted.  Due to improvements request to physician will be sent requesting patient be prescribed one. Pt will continue to benefit from skilled PT services to address strength, balance and mobility deficits in order to improve function at home.    Rehab Potential  Fair    Clinical Impairments Affecting Rehab Potential  hx of HTN, HLD, CVA, learning disability, Diabetes, brain tumor,     PT Frequency  2x / week    PT Duration  8 weeks    PT Treatment/Interventions  ADLs/Self Care Home Management;Aquatic Therapy;Ultrasound;Moist Heat;Traction;DME Instruction;Gait training;Stair training;Functional mobility training;Therapeutic activities;Therapeutic exercise;Orthotic Fit/Training;Neuromuscular re-education;Balance training;Patient/family education;Manual techniques;Wheelchair mobility training;Passive range of motion;Energy conservation;Taping;Visual/perceptual remediation/compensation    PT Next Visit Plan  progress ambulation for duration    PT Home Exercise Plan  TrA contraction, glute sets: needs updates. Potentially some time in supine flat in hospital bed, to improve hip extension deficits.     Consulted and Agree with Plan of Care  Patient       Patient will benefit from skilled therapeutic intervention in order to improve the following deficits and impairments:  Abnormal gait, Decreased activity tolerance, Decreased  balance, Decreased knowledge of precautions, Decreased endurance, Decreased coordination, Decreased knowledge of use of DME, Decreased mobility, Decreased range of motion, Difficulty walking, Decreased safety awareness, Decreased strength, Impaired flexibility, Impaired perceived functional ability, Impaired tone, Postural dysfunction, Improper body mechanics, Pain  Visit Diagnosis: Muscle weakness (generalized)  Other lack of coordination  Other abnormalities of gait and mobility  Hemiplegia and hemiparesis following cerebral infarction affecting right dominant side ([facility]     Problem List Patient Active Problem List   Diagnosis Date Noted  . Acute CV[place]rebro[place]) ([place] 05/02/2016  . Left-sided weakness 05/01/2016   MJanna Arch PT, DPT   01/25/2019, 4:02 PM  [place] [facility] [address] [address]Phone: [phone]  Fax:  [phone] Name: BJAEVIN MEDEARISMRN: [id number] of Birth: [birth year]

## 2019-01-25 NOTE — Therapy (Signed)
[place] MAIN [facility] SERVICES [address] [place], [place], [address] Phone: [phone]   Fax:  [phone]  Occupational Therapy Treatment  Patient Details  Name: Curtis Powell MRN: [id number] Date of Birth: 1965-07-11 No data recorded  Encounter Date: 01/25/2019  OT End of Session - 01/25/19 1436    Visit Number  141    Number of Visits  161    Date for OT Re-Evaluation  02/10/19    Authorization Type  Progress reporting period starting 12/09/2018    OT Start Time  1430    OT Stop Time  1515    OT Time Calculation (min)  45 min    Activity Tolerance  Patient tolerated treatment well    Behavior During Therapy  [facility] for tasks assessed/performed       Past Medical History:  Diagnosis Date  . GERD (gastroesophageal reflux disease)   . Hyperlipidemia   . Hypertension   . Obesity   . Stroke [facility])     Past Surgical History:  Procedure Laterality Date  . BRAIN SURGERY      There were no vitals filed for this visit.  Subjective Assessment - 01/25/19 1435    Subjective   Pt. reports that he has been sleeping better at night    Patient is accompanied by:  Family member    Pertinent History  Pt. is a 53 y.o. male who suffered a CVA on 05/01/2016. Pt. was admitted to the hospital. Once discharged, he received Home Health PT and OT services for about a month. Pt. has had multiple CVAs over the past 8 years, and has had multiple falls in the past 6 months. Pt. resides in an apartment. Pt. has caregivers for 80 hours. Pt.'s mother stays with pt. at night, and assists with IADL tasks.      Currently in Pain?  No/denies      OT TREATMENT    Neuro muscular re-education:  Pt. worked on reaching using the shape tower. Pt. grasped and moved the shapes through 3 vertical dowels of varying heights. Pt. worked on grasping, and manipulating 1/2" washers from a magnetic dish using point grasp pattern. Pt. worked on reaching up, stabilizing, and  sustaining shoulder elevation while placing the washer over a small precise target on vertical dowels positioned at various angles.  Pt. dropped multiple washers from his right hand when attemtping to perform translatory movements of the right hand before placing them over the vertical dowels.  Self-care:  Pt. Education was provided about maneuvering the w/c into position in preparation for toileting.  Response to Treatment:  Pt. has made steady progress, and is now able to use his hands more to assist with turning doorknobs, picking up small objects, and performing toilet hygiene tasks. Pt. required repositioning of his chair, and seat for improved reaching with the RUE. Pt. continues to work on improving BUE strength, and [facility] skills in order to improve RUE functioning for improved ADL, and IADL independence.                       OT Education - 01/25/19 1436    Education provided  Yes    Education Details  UE reaching, Fine motor coordination skills    Person(s) Educated  Patient    Methods  Explanation;Demonstration    Comprehension  Verbalized understanding;Returned demonstration;Verbal cues required       OT Short Term Goals - 03/03/17 1459  OT SHORT TERM GOAL #1   Title  `        OT Long Term Goals - 01/20/19 1444      OT LONG TERM GOAL #4   Title  Pt. will perform self dressing with minA and A/E as needed.    Baseline  Pt. has improved with hiking pants/shorts in standing. ModA socks, and min-modA donning pants over feet. MinA donning a jacket. Indpendent with a T-shirt    Time  12    Period  Weeks    Status  On-going    Target Date  02/10/19      OT LONG TERM GOAL #6   Title  Pt. will write his name efficiently with 100% legibility    Baseline  Pt continues to present with limited legibility and efficiency when writing name to sign in when riding the [place]    Time  12    Period  Weeks    Status  On-going    Target Date  02/10/19      OT LONG  TERM GOAL #7   Title  Pt will independently and consistently follow HEP to increase UE strength to increase functional independence    Baseline  Pt to continues to work towards independence with exercises    Time  12    Period  Weeks    Status  On-going    Target Date  02/10/19      OT LONG TERM GOAL #8   Title  Pt. will require supervision ironing a shirt.    Baseline  Pt. requires CGA-minA, and complete set-up seated using a tabletop iron    Time  12    Status  On-going    Target Date  02/10/19      OT LONG TERM GOAL  #9   Baseline  Pt. will independently be able to sew a button onto a shirt.    Time  12    Period  Weeks    Status  On-going    Target Date  02/10/19      OT LONG TERM GOAL  #10   TITLE  Pt. will independently be able to throw a ball    Baseline  Pt. continues to be limited    Time  12    Period  Weeks    Status  On-going    Target Date  02/10/19      OT LONG TERM GOAL  #11   TITLE  Pt. will improve UE functional reaching to be able to independently use his RUE to hand his clothes in the closest.    Baseline  Pt. continues to have difficulty.    Time  12    Period  Weeks    Status  On-going    Target Date  02/10/19            Plan - 01/25/19 1437    Clinical Impression Statement Pt. has made steady progress, and is now able to use fis hands more to assist with turning doorknobs, pisking up small objects, and perfroming toilet hygiene tasks. Pt. Required repositioning of his chair, and seat for improved reaching with the RUE. Pt. continues to work on improving BUE strength, and [facility] skills in order to improve RUE functioning for improved ADL, and IADL independence.    OT Occupational Profile and History  Problem Focused Assessment - Including review of records relating to presenting problem    Occupational performance deficits (Please refer to evaluation for  details):  ADL's;IADL's    Body Structure / Function / Physical Skills   ADL;FMC;ROM;IADL;Pain;Coordination    Rehab Potential  Fair    Clinical Decision Making  Several treatment options, min-mod task modification necessary    OT Frequency  2x / week    OT Duration  12 weeks    OT Treatment/Interventions  Self-care/ADL training;Patient/family education;DME and/or AE instruction;Therapeutic exercise;Moist Heat;Neuromuscular education    Consulted and Agree with Plan of Care  Patient       Patient will benefit from skilled therapeutic intervention in order to improve the following deficits and impairments:   Body Structure / Function / Physical Skills: ADL, FMC, ROM, IADL, Pain, Coordination       Visit Diagnosis: Muscle weakness (generalized)  Other lack of coordination    Problem List Patient Active Problem List   Diagnosis Date Noted  . Acute CVA (cerebrovascular accident) ([place]) 05/02/2016  . Left-sided weakness 05/01/2016    Harrel Carina, MS, OTR/L 01/25/2019, 2:46 PM  [place] MAIN [facility] SERVICES [address] [place], [place], [address] Phone: [phone]   Fax:  [phone]  Name: Curtis Powell MRN: [id number] Date of Birth: 11/16/1965

## 2019-01-27 ENCOUNTER — Encounter: Payer: Self-pay | Admitting: Occupational Therapy

## 2019-01-27 ENCOUNTER — Ambulatory Visit: Payer: Medicare HMO | Admitting: Occupational Therapy

## 2019-01-27 ENCOUNTER — Other Ambulatory Visit: Payer: Self-pay

## 2019-01-27 ENCOUNTER — Ambulatory Visit: Payer: Medicare HMO

## 2019-01-27 DIAGNOSIS — R278 Other lack of coordination: Secondary | ICD-10-CM

## 2019-01-27 DIAGNOSIS — M6281 Muscle weakness (generalized): Secondary | ICD-10-CM

## 2019-01-27 DIAGNOSIS — I69351 Hemiplegia and hemiparesis following cerebral infarction affecting right dominant side: Secondary | ICD-10-CM

## 2019-01-27 DIAGNOSIS — R2689 Other abnormalities of gait and mobility: Secondary | ICD-10-CM

## 2019-01-27 NOTE — Therapy (Signed)
[place] MAIN [facility] SERVICES [address] [place], [place], [address] Phone: [phone]   Fax:  [phone]  Physical Therapy Treatment  Patient Details  Name: Curtis Powell MRN: [id number] Date of Birth: 1965-10-22 No data recorded  Encounter Date: 01/27/2019  PT End of Session - 01/27/19 1505    Visit Number  147    Number of Visits  152    Date for PT Re-Evaluation  02/15/19    Authorization Type  7/10 PN 01/04/19    Authorization Time Period  authorized 11/16/18-05/19/19    PT Start Time  1430    PT Stop Time  1512    PT Time Calculation (min)  42 min    Equipment Utilized During Treatment  Gait belt    Activity Tolerance  Patient tolerated treatment well;Patient limited by fatigue    Behavior During Therapy  [facility] for tasks assessed/performed       Past Medical History:  Diagnosis Date  . GERD (gastroesophageal reflux disease)   . Hyperlipidemia   . Hypertension   . Obesity   . Stroke [facility])     Past Surgical History:  Procedure Laterality Date  . BRAIN SURGERY      There were no vitals filed for this visit.  Subjective Assessment - 01/27/19 1503    Subjective  Patient reports he is feeling "off today", tired and in a funk. No falls or LOB since last session.    Pertinent History   Patient is a pleasant 53 year old male who presents to physical therapy for weakness and immobility secondary to CVA.  Had a stroke in Feb 2018. He was previously fully independent, but this stroke caused severe residual deficits, mainly on the right side as well as speech, and now he is unable to walk or perform most of his ADLs on his own. Entire right side is very weak. He still has a little difficulty with speech but his swallowing is improved to baseline. His mom had to move in with him and now is his main caregiver.     Limitations  Sitting;Lifting;Standing;Walking;House hold activities    How long can you sit comfortably?  5 minutes    How  long can you stand comfortably?  5 minutes    How long can you walk comfortably?  10 ft    Patient Stated Goals  Walk without walker, walk further with walker, get some strength in back     Currently in Pain?  No/denies         toileting: assistance with set up, able to perform once set up, challenged with negotiating threshold with wheelchair. Propulsion of manual chair with mod I and cueing for sequencing x 60 ft.    Donning/doffing [employer] max AFO on RLE: patient requires mod/max assistance for donning/doffing. Will benefit from continued education on task.    Ambulate 88 ft with [employer] max AFO on RLE for foot position  with improved gait mechanics and RW  .Improved heel strike with decreased episodes of excessive knee hyperextension allows for improved weight shift and mobility. Facilitation provided at PSIS for optimal hip rotation and weight shift.     seated gluteal squeezes : 15x 3 second hold   seated balloon taps reaching inside/outside BOS 3 minutes, upright for trunk stability challenge  Seated adduction ball squeeze 10x 5 second holds  Seated with # 5 ankle weights  -Seated marches with upright posture, back away from back of chair for abdominal/trunk  activation/stabilization, 15x each LE -Seated LAQ with 5 second holds, 10x each LE, cueing for muscle activation and sequencing for neutral alignment -Seated IR/ER with cueing for stabilizing knee placement with lateral foot movement for optimal muscle recruitment, 10x each LE. Ball between knees for promotion of neutral alignment.                 PT Education - 01/27/19 1504    Education provided  Yes    Education Details  AFO donning doffing, exercise technique    Person(s) Educated  Patient    Methods  Explanation;Demonstration;Tactile cues;Verbal cues    Comprehension  Verbalized understanding;Returned demonstration;Verbal cues required;Tactile cues required       PT Short Term Goals - 12/22/18 1049       PT SHORT TERM GOAL #1   Title  Patient will be independent in home exercise program to improve strength/mobility for better functional independence with ADLs.    Baseline  hep compliant    Time  2    Period  Weeks    Status  Achieved      PT SHORT TERM GOAL #2   Title  Patient will require min cueing for STS transfer with CGA for increased independence with mobility.     Baseline  CGA    Time  2    Period  Weeks    Status  Achieved      PT SHORT TERM GOAL #3   Title  Patient will maintain upright posture for > 15 seconds to demonstrate strengthened postural control muscualture     Baseline  18 seconds     Time  2    Period  Weeks    Status  Achieved        PT Long Term Goals - 12/22/18 0001      PT LONG TERM GOAL #1   Title  Patient will perform 10 MWT in < 1 minute to demonstrate improved ambulatory velocity and capacity.     Baseline  4/10: 2 minutes 45 seconds; 5/29: 2 minute 31 seconds; 3 minutes 22 seconds 8/22: 3 minutes 2 seconds  9/12:  2 minutes and 43 seconds 10/15: 77mnute 12 seconds 11/7: 1 minute 21 seconds 12/19: 1 min 16 seconds 3/3: 57 seconds; 08/26/18: 1:41.4s= 0.10 m/s 7/1: 1 minute 14 seconds 9/14: 61 seconds    Time  8    Period  Weeks    Status  Partially Met    Target Date  02/15/19      PT LONG TERM GOAL #2   Title  Patient (< 674years old) will complete five times sit to stand test in < 10 seconds indicating an increased LE strength and improved balance.    Baseline  5/19[facility]er and one on WC, 80% upright posture 7/1: 22 seconds one hand on walker one hand on w/c  9/14: 53 seconds with full upright posture    Time  8    Period  Weeks    Status  On-going    Target Date  02/15/19      PT LONG TERM GOAL #3   Title  Patient will increase Berg Balance score by > 6 points (26/56) to demonstrate decreased fall risk during functional activities.    Baseline  9/12: 20/56 10/15: 21/56 11/7: 20/56 12/19: 22/56; 08/26/18: 11/56  7/1: 17/56  9/14: 23/56    Time  8    Period  Weeks    Status  Partially Met    Target Date  02/15/19      PT LONG TERM GOAL #4   Title  Patient will increase BLE gross strength to 4+/5 as to improve functional strength for independent gait, increased standing tolerance and increased ADL ability.    Baseline   4-/5 RLE, 10/15: 4/5 RLE 11/7: 4/5 RLE 3/3: 4/5 7/1:  hip flexion 4/5 hip extension 2/5 hip abduction/adduciton 2/5     Time  8    Period  Weeks    Status  On-going    Target Date  02/15/19      PT LONG TERM GOAL #5   Title  Patient will increase lower extremity functional scale to >40/80 to demonstrate improved functional mobility and increased tolerance with ADLs.     Baseline  14/80; 12/26: 21/80; 2/18: 28/80  4/10; 25/80 5/29: 27/80; 7/16: 29/80 8/22: 30/80  9/12: 26/80 (feeling more tired due to moving) 10/15: 37/80 11/7: 23/80 3/3: 32/80; 08/25/18: 28/80 9/15: 32/56    Time  8    Period  Weeks    Status  Partially Met    Target Date  02/15/19      PT LONG TERM GOAL #6   Title  Patient will ambulate 150 ft with least assistive device and Supervision with no breaks to allow for increased mobility within home.    Baseline  11/7: 155f 12/19: 115 ft  3/3: will assess next session, has done 200 ft with one rest break; 08/26/18: Pt fatigued after 30' today 9/14: 100 ft with CGA    Time  8    Period  Weeks    Status  Partially Met    Target Date  02/15/19            Plan - 01/27/19 1506    Clinical Impression Statement  Patient is more fatigued this session reducing number of interventions performed. Bathroom mobility continues to be challenged with negotiation of threshold with wheelchair. Fatigue limited duration of ambulation with decreased ability to remain upright noted with prolonged mobility. Pt will continue to benefit from skilled PT services to address strength, balance and mobility deficits in order to improve function at home.    Rehab Potential  Fair     Clinical Impairments Affecting Rehab Potential  hx of HTN, HLD, CVA, learning disability, Diabetes, brain tumor,     PT Frequency  2x / week    PT Duration  8 weeks    PT Treatment/Interventions  ADLs/Self Care Home Management;Aquatic Therapy;Ultrasound;Moist Heat;Traction;DME Instruction;Gait training;Stair training;Functional mobility training;Therapeutic activities;Therapeutic exercise;Orthotic Fit/Training;Neuromuscular re-education;Balance training;Patient/family education;Manual techniques;Wheelchair mobility training;Passive range of motion;Energy conservation;Taping;Visual/perceptual remediation/compensation    PT Next Visit Plan  progress ambulation for duration    PT Home Exercise Plan  TrA contraction, glute sets: needs updates. Potentially some time in supine flat in hospital bed, to improve hip extension deficits.     Consulted and Agree with Plan of Care  Patient       Patient will benefit from skilled therapeutic intervention in order to improve the following deficits and impairments:  Abnormal gait, Decreased activity tolerance, Decreased balance, Decreased knowledge of precautions, Decreased endurance, Decreased coordination, Decreased knowledge of use of DME, Decreased mobility, Decreased range of motion, Difficulty walking, Decreased safety awareness, Decreased strength, Impaired flexibility, Impaired perceived functional ability, Impaired tone, Postural dysfunction, Improper body mechanics, Pain  Visit Diagnosis: Muscle weakness (generalized)  Other lack of coordination  Other abnormalities of gait and mobility  Hemiplegia and hemiparesis following cerebral  infarction affecting right dominant side [facility])     Problem List Patient Active Problem List   Diagnosis Date Noted  . Acute CVA (cerebrovascular accident) ([place]) 05/02/2016  . Left-sided weakness 05/01/2016   Janna Arch, PT, DPT   01/27/2019, 3:17 PM  [place] MAIN [facility]  SERVICES [address] [place], [place], [address] Phone: [phone]   Fax:  [phone]  Name: Curtis Powell MRN: [id number] Date of Birth: 1965-12-17

## 2019-01-27 NOTE — Therapy (Signed)
[place] MAIN [facility] SERVICES [address] [place], [place], [address] Phone: [phone]   Fax:  [phone]  Occupational Therapy Treatment  Curtis Powell Details  Name: Curtis Powell MRN: [id number] Date of Birth: 1965-05-11 No data recorded  Encounter Date: 01/27/2019  OT End of Session - 01/27/19 1521    Visit Number  142    Number of Visits  161    Date for OT Re-Evaluation  02/10/19    Authorization Type  Progress reporting period starting 12/09/2018    OT Start Time  1515    OT Stop Time  1600    OT Time Calculation (min)  45 min    Activity Tolerance  Curtis Powell tolerated treatment well    Behavior During Therapy  [facility] for tasks assessed/performed       Past Medical History:  Diagnosis Date  . GERD (gastroesophageal reflux disease)   . Hyperlipidemia   . Hypertension   . Obesity   . Stroke [facility])     Past Surgical History:  Procedure Laterality Date  . BRAIN SURGERY      There were no vitals filed for this visit.  Subjective Assessment - 01/27/19 1519    Subjective   Pt. reports being in a bad mood today    Curtis Powell is accompanied by:  Family member    Pertinent History  Pt. is a 53 y.o. male who suffered a CVA on 05/01/2016. Pt. was admitted to the hospital. Once discharged, he received Home Health PT and OT services for about a month. Pt. has had multiple CVAs over the past 8 years, and has had multiple falls in the past 6 months. Pt. resides in an apartment. Pt. has caregivers for 80 hours. Pt.'s mother stays with pt. at night, and assists with IADL tasks.      Curtis Powell Stated Goals  To be able to throw a ball and dribble a ball, do as much as I can for myself.     Currently in Pain?  No/denies      OT TREATMENT    Neuro muscular re-education:  Pt. worked on reaching using the shape tower. Pt. grasped and moved the shapes through 3 vertical dowels of varying heights. Pt. worked on grasping 1" resistive cubes alternating thumb  opposition to the tip of the 2nd through 5th digits while the board is placed at a vertical angle, and stabilized with the LUE. Pt. worked on pressing the cubes back into place while alternating isolated 2nd through 5th digit extension. Pt. performed [facility] tasks using the Grooved pegboard. Pt. worked on grasping the grooved pegs from a horizontal position, and moving the pegs to a vertical position in the hand to prepare for placing them in the grooved slot.  Pt. Worked on removing the grooved pegs while alternating thumb opposition to the tip of the 2nd through 5th digits.   Response to Treatment:  Pt. continues to make excellent progress, and continues to use his hands more to assist with turning doorknobs, picking up small objects, and performing toilet hygiene skills. Pt. requires fewer cues to avoid compensating proximally, and in the trunk during reaching tasks.  Pt. continues to work on improving UE functioning in preparation for improviing overall ADL, and IADL functioning.                         OT Education - 01/27/19 1520    Education provided  Yes  Education Details  UE reaching, Fine motor coordination skills    Person(s) Educated  Curtis Powell    Methods  Explanation;Demonstration    Comprehension  Verbalized understanding;Returned demonstration;Verbal cues required       OT Short Term Goals - 03/03/17 1459      OT SHORT TERM GOAL #1   Title  `        OT Long Term Goals - 01/20/19 1444      OT LONG TERM GOAL #4   Title  Pt. will perform self dressing with minA and A/E as needed.    Baseline  Pt. has improved with hiking pants/shorts in standing. ModA socks, and min-modA donning pants over feet. MinA donning a jacket. Indpendent with a T-shirt    Time  12    Period  Weeks    Status  On-going    Target Date  02/10/19      OT LONG TERM GOAL #6   Title  Pt. will write his name efficiently with 100% legibility    Baseline  Pt continues to present with  limited legibility and efficiency when writing name to sign in when riding the [place]    Time  12    Period  Weeks    Status  On-going    Target Date  02/10/19      OT LONG TERM GOAL #7   Title  Pt will independently and consistently follow HEP to increase UE strength to increase functional independence    Baseline  Pt to continues to work towards independence with exercises    Time  12    Period  Weeks    Status  On-going    Target Date  02/10/19      OT LONG TERM GOAL #8   Title  Pt. will require supervision ironing a shirt.    Baseline  Pt. requires CGA-minA, and complete set-up seated using a tabletop iron    Time  12    Status  On-going    Target Date  02/10/19      OT LONG TERM GOAL  #9   Baseline  Pt. will independently be able to sew a button onto a shirt.    Time  12    Period  Weeks    Status  On-going    Target Date  02/10/19      OT LONG TERM GOAL  #10   TITLE  Pt. will independently be able to throw a ball    Baseline  Pt. continues to be limited    Time  12    Period  Weeks    Status  On-going    Target Date  02/10/19      OT LONG TERM GOAL  #11   TITLE  Pt. will improve UE functional reaching to be able to independently use his RUE to hand his clothes in the closest.    Baseline  Pt. continues to have difficulty.    Time  12    Period  Weeks    Status  On-going    Target Date  02/10/19            Plan - 01/27/19 1521    Clinical Impression Statement Pt. continues to make excellent progress, and continues to use his hands more to assist with turning doorknobs, picking up small objects, and performing toilet hygiene skills. Pt. Requires fewer cues to avoid compensating proximally, and in the trunk during reaching tasks.  Pt. continues to work  on improving UE functioning in preparation for improviing overall ADL, and IADL functioning.    OT Occupational Profile and History  Problem Focused Assessment - Including review of records relating to presenting  problem    Occupational performance deficits (Please refer to evaluation for details):  ADL's;IADL's    Body Structure / Function / Physical Skills  ADL;FMC;ROM;IADL;Pain;Coordination    Rehab Potential  Fair    Clinical Decision Making  Several treatment options, min-mod task modification necessary    OT Frequency  2x / week    OT Duration  12 weeks    OT Treatment/Interventions  Self-care/ADL training;Curtis Powell/family education;DME and/or AE instruction;Therapeutic exercise;Moist Heat;Neuromuscular education    Consulted and Agree with Plan of Care  Curtis Powell       Curtis Powell will benefit from skilled therapeutic intervention in order to improve the following deficits and impairments:   Body Structure / Function / Physical Skills: ADL, FMC, ROM, IADL, Pain, Coordination       Visit Diagnosis: Muscle weakness (generalized)  Other lack of coordination    Problem List Curtis Powell Active Problem List   Diagnosis Date Noted  . Acute CVA (cerebrovascular accident) ([place]) 05/02/2016  . Left-sided weakness 05/01/2016    Harrel Carina, MS, OTR/L 01/27/2019, 3:28 PM  [place] MAIN [facility] SERVICES [address] [place], [place], [address] Phone: [phone]   Fax:  [phone]  Name: BRAETON CHAIRES MRN: [id number] Date of Birth: 12/12/1965

## 2019-02-01 ENCOUNTER — Ambulatory Visit: Payer: Medicare HMO | Admitting: Occupational Therapy

## 2019-02-01 ENCOUNTER — Ambulatory Visit: Payer: Medicare HMO

## 2019-02-03 ENCOUNTER — Other Ambulatory Visit: Payer: Self-pay

## 2019-02-03 ENCOUNTER — Encounter: Payer: Self-pay | Admitting: Occupational Therapy

## 2019-02-03 ENCOUNTER — Ambulatory Visit: Payer: Medicare HMO

## 2019-02-03 ENCOUNTER — Ambulatory Visit: Payer: Medicare HMO | Admitting: Occupational Therapy

## 2019-02-03 DIAGNOSIS — R278 Other lack of coordination: Secondary | ICD-10-CM

## 2019-02-03 DIAGNOSIS — M6281 Muscle weakness (generalized): Secondary | ICD-10-CM

## 2019-02-03 DIAGNOSIS — R2689 Other abnormalities of gait and mobility: Secondary | ICD-10-CM

## 2019-02-03 NOTE — Therapy (Signed)
[place] MAIN [facility] SERVICES [address] [place], [place], [address] Phone: [phone]   Fax:  [phone]  Occupational Therapy Treatment  Patient Details  Name: Curtis Powell MRN: [id number] Date of Birth: [birth year] No data recorded  Encounter Date: 02/03/2019  OT End of Session - 02/03/19 1521    Visit Number  143    Number of Visits  161    Date for OT Re-Evaluation  02/10/19    Authorization Type  Progress reporting period starting 12/09/2018    OT Start Time  1515    OT Stop Time  1600    OT Time Calculation (min)  45 min    Activity Tolerance  Patient tolerated treatment well    Behavior During Therapy  [facility] for tasks assessed/performed       Past Medical History:  Diagnosis Date  . GERD (gastroesophageal reflux disease)   . Hyperlipidemia   . Hypertension   . Obesity   . Stroke [facility])     Past Surgical History:  Procedure Laterality Date  . BRAIN SURGERY      There were no vitals filed for this visit.  Subjective Assessment - 02/03/19 1519    Subjective   Pt. reports being in a bad mood today    Patient is accompanied by:  Family member    Pertinent History  Pt. is a 53 y.o. male who suffered a CVA on 05/01/2016. Pt. was admitted to the hospital. Once discharged, he received Home Health PT and OT services for about a month. Pt. has had multiple CVAs over the past 8 years, and has had multiple falls in the past 6 months. Pt. resides in an apartment. Pt. has caregivers for 80 hours. Pt.'s mother stays with pt. at night, and assists with IADL tasks.      Patient Stated Goals  To be able to throw a ball and dribble a ball, do as much as I can for myself.     Currently in Pain?  No/denies      OT TREATMENT    Neuro muscular re-education:  Pt. worked on  [facility] skills grasping, and manipulating 1/2", 3/4", and 1" washers from a magnetic dish using point grasp pattern. Pt. worked on reaching up, stabilizing, and  sustaining shoulder elevation while placing the washer over a small precise target on vertical dowels positioned at various angles.  Pt. Worked on sorting, and stacking washers at the tabletop with cues to stack them accurately. Pt. occasionally tipped the stack of washers over when attempting to stack them higher.   Response to Treatment:  Pt. is making progress, and is improving with engaging his hands to complete more tasks at home including turning doorknobs, picking up small objects, making meals, and perform toilet hygiene skills.  Pt. continues to present with limited functional reaching, and [facility] skills, and tends to drop items when he fatigues as the task progresses. Pt. continues to work on improving UE strength, and [facility] skills with functional reaching in order to improve ADL, and IADL functioning.                          OT Education - 02/03/19 1521    Education provided  Yes    Education Details  UE reaching, Fine motor coordination skills    Person(s) Educated  Patient    Methods  Explanation;Demonstration    Comprehension  Verbalized understanding;Returned demonstration;Verbal cues required  OT Short Term Goals - 03/03/17 1459      OT SHORT TERM GOAL #1   Title  `        OT Long Term Goals - 01/20/19 1444      OT LONG TERM GOAL #4   Title  Pt. will perform self dressing with minA and A/E as needed.    Baseline  Pt. has improved with hiking pants/shorts in standing. ModA socks, and min-modA donning pants over feet. MinA donning a jacket. Indpendent with a T-shirt    Time  12    Period  Weeks    Status  On-going    Target Date  02/10/19      OT LONG TERM GOAL #6   Title  Pt. will write his name efficiently with 100% legibility    Baseline  Pt continues to present with limited legibility and efficiency when writing name to sign in when riding the [place]    Time  12    Period  Weeks    Status  On-going    Target Date  02/10/19      OT LONG  TERM GOAL #7   Title  Pt will independently and consistently follow HEP to increase UE strength to increase functional independence    Baseline  Pt to continues to work towards independence with exercises    Time  12    Period  Weeks    Status  On-going    Target Date  02/10/19      OT LONG TERM GOAL #8   Title  Pt. will require supervision ironing a shirt.    Baseline  Pt. requires CGA-minA, and complete set-up seated using a tabletop iron    Time  12    Status  On-going    Target Date  02/10/19      OT LONG TERM GOAL  #9   Baseline  Pt. will independently be able to sew a button onto a shirt.    Time  12    Period  Weeks    Status  On-going    Target Date  02/10/19      OT LONG TERM GOAL  #10   TITLE  Pt. will independently be able to throw a ball    Baseline  Pt. continues to be limited    Time  12    Period  Weeks    Status  On-going    Target Date  02/10/19      OT LONG TERM GOAL  #11   TITLE  Pt. will improve UE functional reaching to be able to independently use his RUE to hand his clothes in the closest.    Baseline  Pt. continues to have difficulty.    Time  12    Period  Weeks    Status  On-going    Target Date  02/10/19            Plan - 02/03/19 1522    Clinical Impression Statement  Pt. is making progress, and is improving with engaging his hands to complete more tasks at home including turning doorknobs, picking up small objects, making meals, and perform toilet hygiene skills. Pt. continues to work on improving UE strength, and [facility] skills with functional reaching in order to improve ADL,and IADL functioning.    Occupational performance deficits (Please refer to evaluation for details):  ADL's;IADL's    Body Structure / Function / Physical Skills  ADL;FMC;ROM;IADL;Pain;Coordination    Rehab Potential  Fair    Clinical Decision Making  Several treatment options, min-mod task modification necessary    OT Frequency  2x / week    OT Duration  12 weeks     OT Treatment/Interventions  Self-care/ADL training;Patient/family education;DME and/or AE instruction;Therapeutic exercise;Moist Heat;Neuromuscular education    Consulted and Agree with Plan of Care  Patient       Patient will benefit from skilled therapeutic intervention in order to improve the following deficits and impairments:   Body Structure / Function / Physical Skills: ADL, FMC, ROM, IADL, Pain, Coordination       Visit Diagnosis: Muscle weakness (generalized)  Other lack of coordination    Problem List Patient Active Problem List   Diagnosis Date Noted  . Acute CVA (cerebrovascular accident) ([place]) 05/02/2016  . Left-sided weakness 05/01/2016    Harrel Carina, MS, OTR/L 02/03/2019, 3:35 PM  [place] MAIN [facility] SERVICES [address] [place], [place], [address] Phone: [phone]   Fax:  [phone]  Name: Curtis Powell MRN: [id number] Date of Birth: [birth year]

## 2019-02-03 NOTE — Therapy (Signed)
[place] MAIN [facility] SERVICES [address] [place], [place], [address] Phone: [phone]   Fax:  [phone]  Physical Therapy Treatment  Patient Details  Name: Curtis Powell MRN: [id number] Date of Birth: [birth year] No data recorded  Encounter Date: 02/03/2019  PT End of Session - 02/03/19 1750    Visit Number  [id number]    Number of Visits  152    Date for PT Re-Evaluation  02/15/19    Authorization Type  8/10 PN 01/04/19    Authorization Time Period  authorized 11/16/18-05/19/19    PT Start Time  1430    PT Stop Time  1513    PT Time Calculation (min)  43 min    Equipment Utilized During Treatment  Gait belt    Activity Tolerance  Patient tolerated treatment well;Patient limited by fatigue    Behavior During Therapy  [facility] for tasks assessed/performed       Past Medical History:  Diagnosis Date  . GERD (gastroesophageal reflux disease)   . Hyperlipidemia   . Hypertension   . Obesity   . Stroke [facility])     Past Surgical History:  Procedure Laterality Date  . BRAIN SURGERY      There were no vitals filed for this visit.  Subjective Assessment - 02/03/19 1749    Subjective  Patient reports compliance with HEP, no falls or LOB since last session.    Pertinent History   Patient is a pleasant 53 year old male who presents to physical therapy for weakness and immobility secondary to CVA.  Had a stroke in Feb 2018. He was previously fully independent, but this stroke caused severe residual deficits, mainly on the right side as well as speech, and now he is unable to walk or perform most of his ADLs on his own. Entire right side is very weak. He still has a little difficulty with speech but his swallowing is improved to baseline. His mom had to move in with him and now is his main caregiver.     Limitations  Sitting;Lifting;Standing;Walking;House hold activities    How long can you sit comfortably?  5 minutes    How long can you stand  comfortably?  5 minutes    How long can you walk comfortably?  10 ft    Patient Stated Goals  Walk without walker, walk further with walker, get some strength in back     Currently in Pain?  No/denies            Treatment:   in // bars with AFO on RLE:  -Side step in // bars 2x length, min cueing for body mechanics/positioning  -posterior ambulation 2x length of // bars, mod cueing for arm position   -static standing upright posture with cueing for increased neutral trunk alignment with BUE support 30 seconds x 2  toileting: assistance with set up, able to perform once set up, challenged with negotiating threshold with wheelchair. Propulsion of manual chair with mod I and cueing for sequencing x 60 ft.     Donning/doffing [employer] max AFO on RLE: patient requires mod/max assistance for donning/doffing. Will benefit from continued education on task.   seated  Seated adduction ball squeeze 10x 5 second holds GTB hamstring curls 15x each LE  GTB resisted marching 15x each LE  GTB resisted adduction 15x each LE     Pt educated throughout session about proper posture and technique with exercises. Improved exercise technique, movement at target joints,  use of target muscles after min to mod verbal, visual, tactile cues         PT Education - 02/03/19 1749    Education provided  Yes    Education Details  exercise technique, body mechanics    Person(s) Educated  Patient    Methods  Explanation;Demonstration;Tactile cues;Verbal cues    Comprehension  Verbalized understanding;Returned demonstration;Verbal cues required;Tactile cues required       PT Short Term Goals - 12/22/18 1049      PT SHORT TERM GOAL #1   Title  Patient will be independent in home exercise program to improve strength/mobility for better functional independence with ADLs.    Baseline  hep compliant    Time  2    Period  Weeks    Status  Achieved      PT SHORT TERM GOAL #2   Title  Patient will  require min cueing for STS transfer with CGA for increased independence with mobility.     Baseline  CGA    Time  2    Period  Weeks    Status  Achieved      PT SHORT TERM GOAL #3   Title  Patient will maintain upright posture for > 15 seconds to demonstrate strengthened postural control muscualture     Baseline  18 seconds     Time  2    Period  Weeks    Status  Achieved        PT Long Term Goals - 12/22/18 0001      PT LONG TERM GOAL #1   Title  Patient will perform 10 MWT in < 1 minute to demonstrate improved ambulatory velocity and capacity.     Baseline  4/10: 2 minutes 45 seconds; 5/29: 2 minute 31 seconds; 3 minutes 22 seconds 8/22: 3 minutes 2 seconds  9/12:  2 minutes and 43 seconds 10/15: 49mnute 12 seconds 11/7: 1 minute 21 seconds 12/19: 1 min 16 seconds 3/3: 57 seconds; 08/26/18: 1:41.4s= 0.10 m/s 7/1: 1 minute 14 seconds 9/14: 61 seconds    Time  8    Period  Weeks    Status  Partially Met    Target Date  02/15/19      PT LONG TERM GOAL #2   Title  Patient (< 727years old) will complete five times sit to stand test in < 10 seconds indicating an increased LE strength and improved balance.    [facility]  [facility] one hand on walker and one on WC, 80% upright posture 7/1: 22 seconds one hand on walker one hand on w/c  9/14: 53 seconds with full upright posture    Time  8    Period  Weeks    Status  On-going    Target Date  02/15/19      PT LONG TERM GOAL #3   Title  Patient will increase Berg Balance score by > 6 points (26/56) to demonstrate decreased fall risk during functional activities.    Baseline  9/12: 20/56 10/15: 21/56 11/7: 20/56 12/19: 22/56; 08/26/18: 11/56 7/1: 17/56  9/14: 23/56    Time  8    Period  Weeks    Status  Partially Met    Target Date  02/15/19      PT LONG TERM GOAL #4   Title  Patient will increase BLE gross strength to 4+/5 as to improve functional strength for independent gait, increased standing tolerance and increased ADL  ability.    Baseline   4-/5 RLE, 10/15: 4/5 RLE 11/7: 4/5 RLE 3/3: 4/5 7/1:  hip flexion 4/5 hip extension 2/5 hip abduction/adduciton 2/5     Time  8    Period  Weeks    Status  On-going    Target Date  02/15/19      PT LONG TERM GOAL #5   Title  Patient will increase lower extremity functional scale to >40/80 to demonstrate improved functional mobility and increased tolerance with ADLs.     Baseline  14/80; 12/26: 21/80; 2/18: 28/80  4/10; 25/80 5/29: 27/80; 7/16: 29/80 8/22: 30/80  9/12: 26/80 (feeling more tired due to moving) 10/15: 37/80 11/7: 23/80 3/3: 32/80; 08/25/18: 28/80 9/15: 32/56    Time  8    Period  Weeks    Status  Partially Met    Target Date  02/15/19      PT LONG TERM GOAL #6   Title  Patient will ambulate 150 ft with least assistive device and Supervision with no breaks to allow for increased mobility within home.    Baseline  11/7: 129f 12/19: 115 ft  3/3: will assess next session, has done 200 ft with one rest break; 08/26/18: Pt fatigued after 30' today 9/14: 100 ft with CGA    Time  8    Period  Weeks    Status  Partially Met    Target Date  02/15/19            Plan - 02/03/19 1751    Clinical Impression Statement  Patient is challenged with upright posture with dynamic standing interventions such as side stepping and backwards ambulation, resulting in reversion to forward trunk posture with fatigue. Patient responds well to tactile and verbal cueing for posture however requires max cueing at this time. Pt will continue to benefit from skilled PT services to address strength, balance and mobility deficits in order to improve function at home.    Rehab Potential  Fair    Clinical Impairments Affecting Rehab Potential  hx of HTN, HLD, CVA, learning disability, Diabetes, brain tumor,     PT Frequency  2x / week    PT Duration  8 weeks    PT Treatment/Interventions  ADLs/Self Care Home Management;Aquatic Therapy;Ultrasound;Moist Heat;Traction;DME  Instruction;Gait training;Stair training;Functional mobility training;Therapeutic activities;Therapeutic exercise;Orthotic Fit/Training;Neuromuscular re-education;Balance training;Patient/family education;Manual techniques;Wheelchair mobility training;Passive range of motion;Energy conservation;Taping;Visual/perceptual remediation/compensation    PT Next Visit Plan  progress ambulation for duration    PT Home Exercise Plan  TrA contraction, glute sets: needs updates. Potentially some time in supine flat in hospital bed, to improve hip extension deficits.     Consulted and Agree with Plan of Care  Patient       Patient will benefit from skilled therapeutic intervention in order to improve the following deficits and impairments:  Abnormal gait, Decreased activity tolerance, Decreased balance, Decreased knowledge of precautions, Decreased endurance, Decreased coordination, Decreased knowledge of use of DME, Decreased mobility, Decreased range of motion, Difficulty walking, Decreased safety awareness, Decreased strength, Impaired flexibility, Impaired perceived functional ability, Impaired tone, Postural dysfunction, Improper body mechanics, Pain  Visit Diagnosis: Muscle weakness (generalized)  Other lack of coordination  Other abnormalities of gait and mobility     Problem List Patient Active Problem List   Diagnosis Date Noted  . [place] 05[place]  . Left-sided weakness 05/01/2016   MJanna Arch PT, DPT   02/03/2019, 5:52 PM  [place]  MAIN [facility] SERVICES [place], [place], [address] Phone: [phone]   Fax:  [phone]  Name: Curtis Powell MRN: [id number] Date of Birth: 1965-06-08

## 2019-02-08 ENCOUNTER — Encounter: Payer: Self-pay | Admitting: Occupational Therapy

## 2019-02-08 ENCOUNTER — Ambulatory Visit: Payer: Medicare HMO | Admitting: Occupational Therapy

## 2019-02-08 ENCOUNTER — Other Ambulatory Visit: Payer: Self-pay

## 2019-02-08 ENCOUNTER — Ambulatory Visit: Payer: Medicare HMO | Attending: Family Medicine

## 2019-02-08 DIAGNOSIS — R2689 Other abnormalities of gait and mobility: Secondary | ICD-10-CM | POA: Diagnosis present

## 2019-02-08 DIAGNOSIS — M6281 Muscle weakness (generalized): Secondary | ICD-10-CM | POA: Insufficient documentation

## 2019-02-08 DIAGNOSIS — R278 Other lack of coordination: Secondary | ICD-10-CM | POA: Diagnosis present

## 2019-02-08 DIAGNOSIS — I69351 Hemiplegia and hemiparesis following cerebral infarction affecting right dominant side: Secondary | ICD-10-CM | POA: Insufficient documentation

## 2019-02-08 NOTE — Therapy (Signed)
[place] MAIN [facility] SERVICES [address] [place], [place], [address] Phone: [phone]   Fax:  [phone]  Physical Therapy Treatment  Patient Details  Name: Curtis Powell MRN: [id number] Date of Birth: 23-Apr-1965 No data recorded  Encounter Date: 02/08/2019  PT End of Session - 02/08/19 1251    Visit Number  149    Number of Visits  152    Date for PT Re-Evaluation  02/15/19    Authorization Type  9/10 PN 01/04/19    Authorization Time Period  authorized 11/16/18-05/19/19    PT Start Time  1300    PT Stop Time  1340    PT Time Calculation (min)  40 min    Equipment Utilized During Treatment  Gait belt    Activity Tolerance  Patient tolerated treatment well;Patient limited by fatigue    Behavior During Therapy  [facility] for tasks assessed/performed       Past Medical History:  Diagnosis Date  . GERD (gastroesophageal reflux disease)   . Hyperlipidemia   . Hypertension   . Obesity   . Stroke [facility])     Past Surgical History:  Procedure Laterality Date  . BRAIN SURGERY      There were no vitals filed for this visit.  Subjective Assessment - 02/08/19 1302    Subjective  Patient reported no pain at start of session, stated he had a good weekend. Reported compliant at home with HEP.    Pertinent History   Patient is a pleasant 53 year old male who presents to physical therapy for weakness and immobility secondary to CVA.  Had a stroke in Feb 2018. He was previously fully independent, but this stroke caused severe residual deficits, mainly on the right side as well as speech, and now he is unable to walk or perform most of his ADLs on his own. Entire right side is very weak. He still has a little difficulty with speech but his swallowing is improved to baseline. His mom had to move in with him and now is his main caregiver.     How long can you sit comfortably?  5 minutes    How long can you stand comfortably?  5 minutes    How long can  you walk comfortably?  10 ft    Patient Stated Goals  Walk without walker, walk further with walker, get some strength in back     Currently in Pain?  No/denies      TREATMENT:   From WC: -weight shifts with RW x5 ea leg  Standing marching with RW 2x5 reps   -static standing upright posture with cueing for increased neutral trunk alignment with BUE support 30 seconds x 2    sit to stands total of 5 times, 3 in a row, CGA-MINA to complete safely   Pt ambulated ~17f x2 CGA-minA, close WC follow. Improved foot clearance noted with AFO donned. 1-2 instances of minimal LOB noted.    Donning/doffing [employer] max AFO on RLE: patient requires mod/max assistance for donning/doffing. Will benefit from continued education on task.    Seated:  Seated adduction ball squeeze 15x (10second holds)  GTB hamstring curls 15x each LE    GTB resisted marching 15x each LE    GTB resisted abduction 15x each LE  (3second holds)    Pt educated throughout session about proper posture and technique with exercises. Improved exercise technique, movement at target joints, use of target muscles after min to mod  verbal, visual, tactile cues   Pt response/clinical impression: The patient was challenged by sit to stands today, epecially while rising and with initial standing balance, intermittent minA to complete, improved ability to step by step cues for technique. The patient had 1-2 minimal LOB during session with minA to correct with RW. The patient remained motivated throughout session to maximize participation. The patient would benefit from further skilled PT intervention to progress towards goals.                         PT Education - 02/08/19 1250    Education provided  Yes    Education Details  exercise tehcnique/form    Person(s) Educated  Patient    Methods  Explanation;Demonstration;Tactile cues;Verbal cues    Comprehension  Verbalized understanding;Returned  demonstration;Verbal cues required;Tactile cues required       PT Short Term Goals - 12/22/18 1049      PT SHORT TERM GOAL #1   Title  Patient will be independent in home exercise program to improve strength/mobility for better functional independence with ADLs.    Baseline  hep compliant    Time  2    Period  Weeks    Status  Achieved      PT SHORT TERM GOAL #2   Title  Patient will require min cueing for STS transfer with CGA for increased independence with mobility.     Baseline  CGA    Time  2    Period  Weeks    Status  Achieved      PT SHORT TERM GOAL #3   Title  Patient will maintain upright posture for > 15 seconds to demonstrate strengthened postural control muscualture     Baseline  18 seconds     Time  2    Period  Weeks    Status  Achieved        PT Long Term Goals - 12/22/18 0001      PT LONG TERM GOAL #1   Title  Patient will perform 10 MWT in < 1 minute to demonstrate improved ambulatory velocity and capacity.     Baseline  4/10: 2 minutes 45 seconds; 5/29: 2 minute 31 seconds; 3 minutes 22 seconds 8/22: 3 minutes 2 seconds  9/12:  2 minutes and 43 seconds 10/15: 9mnute 12 seconds 11/7: 1 minute 21 seconds 12/19: 1 min 16 seconds 3/3: 57 seconds; 08/26/18: 1:41.4s= 0.10 m/s 7/1: 1 minute 14 seconds 9/14: 61 seconds    Time  8    Period  Weeks    Status  Partially Met    Target Date  02/15/19      PT LONG TERM GOAL #2   Title  Patient (< 624years old) will complete five times sit to stand test in < 10 seconds indicating an increased LE strength and improved balance.    [facility]  [facility], 80% upright posture 7/1: 22 seconds one hand on walker one hand on w/c  9/14: 53 seconds with full upright posture    Time  8    Period  Weeks    Status  On-going    Target Date  02/15/19      PT LONG TERM GOAL #3   Title  Patient will increase Berg Balance score by > 6 points (26/56) to demonstrate decreased fall risk during  functional activities.    Baseline  9/12: 20/56 10/15: 21/56 11/7: 20/56 12/19: 22/56; 08/26/18: 11/56 7/1: 17/56  9/14: 23/56    Time  8    Period  Weeks    Status  Partially Met    Target Date  02/15/19      PT LONG TERM GOAL #4   Title  Patient will increase BLE gross strength to 4+/5 as to improve functional strength for independent gait, increased standing tolerance and increased ADL ability.    Baseline   4-/5 RLE, 10/15: 4/5 RLE 11/7: 4/5 RLE 3/3: 4/5 7/1:  hip flexion 4/5 hip extension 2/5 hip abduction/adduciton 2/5     Time  8    Period  Weeks    Status  On-going    Target Date  02/15/19      PT LONG TERM GOAL #5   Title  Patient will increase lower extremity functional scale to >40/80 to demonstrate improved functional mobility and increased tolerance with ADLs.     Baseline  14/80; 12/26: 21/80; 2/18: 28/80  4/10; 25/80 5/29: 27/80; 7/16: 29/80 8/22: 30/80  9/12: 26/80 (feeling more tired due to moving) 10/15: 37/80 11/7: 23/80 3/3: 32/80; 08/25/18: 28/80 9/15: 32/56    Time  8    Period  Weeks    Status  Partially Met    Target Date  02/15/19      PT LONG TERM GOAL #6   Title  Patient will ambulate 150 ft with least assistive device and Supervision with no breaks to allow for increased mobility within home.    Baseline  11/7: 13f 12/19: 115 ft  3/3: will assess next session, has done 200 ft with one rest break; 08/26/18: Pt fatigued after 30' today 9/14: 100 ft with CGA    Time  8    Period  Weeks    Status  Partially Met    Target Date  02/15/19            Plan - 02/08/19 1250    Clinical Impression Statement  The patient was challenged by sit to stands today, epecially while rising and with initial standing balance, intermittent minA to complete, improved ability to step by step cues for technique. The patient had 1-2 minimal LOB during session with minA to correct with RW. The patient remained motivated throughout session to maximize participation. The patient  would benefit from further skilled PT intervention to progress towards goals.    Rehab Potential  Fair    Clinical Impairments Affecting Rehab Potential  hx of HTN, HLD, CVA, learning disability, Diabetes, brain tumor,     PT Frequency  2x / week    PT Duration  8 weeks    PT Treatment/Interventions  ADLs/Self Care Home Management;Aquatic Therapy;Ultrasound;Moist Heat;Traction;DME Instruction;Gait training;Stair training;Functional mobility training;Therapeutic activities;Therapeutic exercise;Orthotic Fit/Training;Neuromuscular re-education;Balance training;Patient/family education;Manual techniques;Wheelchair mobility training;Passive range of motion;Energy conservation;Taping;Visual/perceptual remediation/compensation    PT Next Visit Plan  progress ambulation for duration    PT Home Exercise Plan  TrA contraction, glute sets: needs updates. Potentially some time in supine flat in hospital bed, to improve hip extension deficits.     Consulted and Agree with Plan of Care  Patient    Family Member Consulted  mother       Patient will benefit from skilled therapeutic intervention in order to improve the following deficits and impairments:  Abnormal gait, Decreased activity tolerance, Decreased balance, Decreased knowledge of precautions, Decreased endurance, Decreased coordination, Decreased knowledge of use of DME, Decreased mobility, Decreased range of motion,  Difficulty walking, Decreased safety awareness, Decreased strength, Impaired flexibility, Impaired perceived functional ability, Impaired tone, Postural dysfunction, Improper body mechanics, Pain  Visit Diagnosis: Muscle weakness (generalized)  Other abnormalities of gait and mobility  Hemiplegia and hemiparesis following cerebral infarction affecting right dominant side (HCC)  Other lack of coordination     Problem List Patient Active Problem List   Diagnosis Date Noted  . Acute CVA (cerebrovascular accident) ([place]) 05/02/2016   . Left-sided weakness 05/01/2016    Lieutenant Diego PT, DPT 1:52 PM,02/08/19 [place] MAIN [facility] SERVICES [address] [place], [place], [address] Phone: [phone]   Fax:  [phone]  Name: Curtis Powell MRN: [id number] Date of Birth: March 07, 1966

## 2019-02-08 NOTE — Therapy (Signed)
[place] MAIN [facility] SERVICES [address] [place], [place], [address] Phone: [phone]   Fax:  [phone]  Occupational Therapy Treatment  Patient Details  Name: Curtis Powell MRN: [id number] Date of Birth: 30-Sep-1965 No data recorded  Encounter Date: 02/08/2019  OT End of Session - 02/08/19 1402    Visit Number  144    Number of Visits  161    Date for OT Re-Evaluation  02/10/19    Authorization Type  Progress reporting period starting 12/09/2018    OT Start Time  1348    OT Stop Time  1430    OT Time Calculation (min)  42 min    Equipment Utilized During Treatment  Long handled shoe horn    Activity Tolerance  Patient tolerated treatment well    Behavior During Therapy  WFL for tasks assessed/performed       Past Medical History:  Diagnosis Date  . GERD (gastroesophageal reflux disease)   . Hyperlipidemia   . Hypertension   . Obesity   . Stroke [facility])     Past Surgical History:  Procedure Laterality Date  . BRAIN SURGERY      There were no vitals filed for this visit.  Subjective Assessment - 02/08/19 1401    Subjective   Pt. reports going to a party this past Saturday.    Patient is accompanied by:  Family member    Pertinent History  Pt. is a 53 y.o. male who suffered a CVA on 05/01/2016. Pt. was admitted to the hospital. Once discharged, he received Home Health PT and OT services for about a month. Pt. has had multiple CVAs over the past 8 years, and has had multiple falls in the past 6 months. Pt. resides in an apartment. Pt. has caregivers for 80 hours. Pt.'s mother stays with pt. at night, and assists with IADL tasks.      Currently in Pain?  No/denies      OT TREATMENT    Neuro muscular re-education:  Pt. worked on reaching using the shape tower. Pt. grasped and moved the shapes through 3 vertical dowels of varying heights. Pt. worked on right [facility] skills grasping 1" sticks, 1/4" collars, and 1/4" washers. Pt.  worked on storing the objects in the palm, and translatory skills moving the items from the palm of the hand to the tip of the 2nd digit, and thumb. Pt. worked on removing the pegs using bilateral alternating hand patterns.  Response to Treatment:   Pt. is making making progress overall, and has improved with RUE functional reaching with decreased compensation proximally in the right shoulder, and leaning to the left when reaching with the trunk. Pt. required repositioning of the tower when reaching. Pt. Presents with limited motor control, and [facility] skills when manipulating small objects, Pt. continues to work on improving UE strength, and [facility] skills in order to improve, and increase engagement during ADLs,and IADL tasks.               OT Education - 02/08/19 1402    Education provided  Yes    Education Details  UE reaching, Fine motor coordination skills    Person(s) Educated  Patient    Methods  Explanation;Demonstration    Comprehension  Verbalized understanding;Returned demonstration;Verbal cues required       OT Short Term Goals - 03/03/17 1459      OT SHORT TERM GOAL #1   Title  `  OT Long Term Goals - 01/20/19 1444      OT LONG TERM GOAL #4   Title  Pt. will perform self dressing with minA and A/E as needed.    Baseline  Pt. has improved with hiking pants/shorts in standing. ModA socks, and min-modA donning pants over feet. MinA donning a jacket. Indpendent with a T-shirt    Time  12    Period  Weeks    Status  On-going    Target Date  02/10/19      OT LONG TERM GOAL #6   Title  Pt. will write his name efficiently with 100% legibility    Baseline  Pt continues to present with limited legibility and efficiency when writing name to sign in when riding the [place]    Time  12    Period  Weeks    Status  On-going    Target Date  02/10/19      OT LONG TERM GOAL #7   Title  Pt will independently and consistently follow HEP to increase UE strength to increase  functional independence    Baseline  Pt to continues to work towards independence with exercises    Time  12    Period  Weeks    Status  On-going    Target Date  02/10/19      OT LONG TERM GOAL #8   Title  Pt. will require supervision ironing a shirt.    Baseline  Pt. requires CGA-minA, and complete set-up seated using a tabletop iron    Time  12    Status  On-going    Target Date  02/10/19      OT LONG TERM GOAL  #9   Baseline  Pt. will independently be able to sew a button onto a shirt.    Time  12    Period  Weeks    Status  On-going    Target Date  02/10/19      OT LONG TERM GOAL  #10   TITLE  Pt. will independently be able to throw a ball    Baseline  Pt. continues to be limited    Time  12    Period  Weeks    Status  On-going    Target Date  02/10/19      OT LONG TERM GOAL  #11   TITLE  Pt. will improve UE functional reaching to be able to independently use his RUE to hand his clothes in the closest.    Baseline  Pt. continues to have difficulty.    Time  12    Period  Weeks    Status  On-going    Target Date  02/10/19            Plan - 02/08/19 1403    Clinical Impression Statement Pt. is making making progress overall, and has improved with RUE functional reaching with decreased compensation proximally in the right shoulder, and leaning to the left when reaching with the trunk. Pt. required repositioning of the tower when reaching. Pt. Presents with limited motor control, and [facility] skills when manipulating small objects, Pt. continues to work on improving UE strength, and [facility] skills in order to improve, and increase engagement during ADLs,and IADL tasks.    OT Occupational Profile and History  Problem Focused Assessment - Including review of records relating to presenting problem    Occupational performance deficits (Please refer to evaluation for details):  ADL's;IADL's    Body Structure /  Function / Physical Skills  ADL;FMC;ROM;IADL;Pain;Coordination    Rehab  Potential  Fair    Clinical Decision Making  Several treatment options, min-mod task modification necessary    OT Frequency  2x / week    OT Duration  12 weeks    OT Treatment/Interventions  Self-care/ADL training;Patient/family education;DME and/or AE instruction;Therapeutic exercise;Moist Heat;Neuromuscular education    Consulted and Agree with Plan of Care  Patient       Patient will benefit from skilled therapeutic intervention in order to improve the following deficits and impairments:   Body Structure / Function / Physical Skills: ADL, FMC, ROM, IADL, Pain, Coordination       Visit Diagnosis: Muscle weakness (generalized)  Other lack of coordination    Problem List Patient Active Problem List   Diagnosis Date Noted  . Acute CVA (cerebrovascular accident) ([place]) 05/02/2016  . Left-sided weakness 05/01/2016    Harrel Carina, MS, OTR/L 02/08/2019, 2:20 PM  [place] MAIN [facility] SERVICES [address] [place], [place], [address] Phone: [phone]   Fax:  [phone]  Name: Curtis Powell MRN: [id number] Date of Birth: April 22, 1965

## 2019-02-10 ENCOUNTER — Ambulatory Visit: Payer: Medicare HMO | Admitting: Occupational Therapy

## 2019-02-10 ENCOUNTER — Encounter: Payer: Self-pay | Admitting: Occupational Therapy

## 2019-02-10 ENCOUNTER — Other Ambulatory Visit: Payer: Self-pay

## 2019-02-10 ENCOUNTER — Ambulatory Visit: Payer: Medicare HMO

## 2019-02-10 DIAGNOSIS — R278 Other lack of coordination: Secondary | ICD-10-CM

## 2019-02-10 DIAGNOSIS — I69351 Hemiplegia and hemiparesis following cerebral infarction affecting right dominant side: Secondary | ICD-10-CM

## 2019-02-10 DIAGNOSIS — R2689 Other abnormalities of gait and mobility: Secondary | ICD-10-CM

## 2019-02-10 DIAGNOSIS — M6281 Muscle weakness (generalized): Secondary | ICD-10-CM | POA: Diagnosis not present

## 2019-02-10 NOTE — Therapy (Signed)
[place] MAIN [facility] SERVICES [address] [place], [place], [address] Phone: [phone]   Fax:  [phone]  Occupational Therapy Treatment/Recertification Note  Patient Details  Name: Curtis Powell MRN: [id number] Date of Birth: Feb 03, 1966 No data recorded  Encounter Date: 02/10/2019  OT End of Session - 02/10/19 1743    Visit Number  145    Number of Visits  161    Date for OT Re-Evaluation  05/05/19    Authorization Type  Progress reporting period starting 12/09/2018    OT Start Time  1525    OT Stop Time  1400    OT Time Calculation (min)  1355 min    Activity Tolerance  Patient tolerated treatment well    Behavior During Therapy  [facility] for tasks assessed/performed       Past Medical History:  Diagnosis Date  . GERD (gastroesophageal reflux disease)   . Hyperlipidemia   . Hypertension   . Obesity   . Stroke [facility])     Past Surgical History:  Procedure Laterality Date  . BRAIN SURGERY      There were no vitals filed for this visit.  Subjective Assessment - 02/10/19 1525    Subjective   Pt reports no pain    Patient is accompanied by:  Family member    Pertinent History  Pt. is a 53 y.o. male who suffered a CVA on 05/01/2016. Pt. was admitted to the hospital. Once discharged, he received Home Health PT and OT services for about a month. Pt. has had multiple CVAs over the past 8 years, and has had multiple falls in the past 6 months. Pt. resides in an apartment. Pt. has caregivers for 80 hours. Pt.'s mother stays with pt. at night, and assists with IADL tasks.           [facility] OT Assessment - 02/10/19 0001      Coordination   Right 9 Hole Peg Test  52    Left 9 Hole Peg Test  46      Hand Function   Right Hand Grip (lbs)  43    Right Hand Lateral Pinch  25 lbs    Right Hand 3 Point Pinch  16 lbs    Left Hand Grip (lbs)  30    Left Hand Lateral Pinch  19 lbs    Left 3 point pinch  10 lbs      Measurement were  obtained, and goals were reviewed with the pt.                  OT Education - 02/10/19 1743    Education provided  Yes    Education Details  UE reaching, Fine motor coordination skills    Person(s) Educated  Patient    Methods  Explanation;Demonstration    Comprehension  Verbalized understanding;Returned demonstration;Verbal cues required       OT Short Term Goals - 03/03/17 1459      OT SHORT TERM GOAL #1   Title  `        OT Long Term Goals - 02/10/19 1537      OT LONG TERM GOAL #4   Title  Pt. will perform self dressing with minA and A/E as needed.    Baseline  Pt. has improved with hiking pants/shorts in standing with modified independence. MinA-ModA socks, and minA donning pants over feet. MinA donning a jacket. Indpendent with a T-shirt    Time  12  Period  Weeks    Status  On-going    Target Date  05/05/19      Long Term Additional Goals   Additional Long Term Goals  Yes      OT LONG TERM GOAL #6   Title  Pt. will write his name efficiently with 100% legibility    Baseline  Pt continues to present with limited legibility and efficiency when writing name to sign in when riding the [place]    Time  12    Period  Weeks    Status  On-going    Target Date  05/05/19      OT LONG TERM GOAL #7   Title  Pt will independently and consistently follow HEP to increase UE strength to increase functional independence    Baseline  Pt to continues to work towards independence with exercises    Period  Weeks    Status  On-going    Target Date  05/05/19      OT LONG TERM GOAL #8   Title  Pt. will require supervision ironing a shirt.    Baseline  Pt. requires CGA and complete set-up seated using a tabletop iron    Time  12    Period  Weeks    Status  On-going    Target Date  05/05/19      OT LONG TERM GOAL  #9   Baseline  Pt. will independently be able to sew a button onto a shirt.    Time  12    Period  Weeks    Status  On-going    Target Date  05/05/19       OT LONG TERM GOAL  #10   TITLE  Pt. will independently be able to throw a ball    Baseline  Pt. continues to be limited    Time  12    Period  Weeks    Status  On-going    Target Date  05/05/19      OT LONG TERM GOAL  #11   TITLE  Pt. will improve RUE functional reaching to be able to independently use his RUE to hand his clothes in the closest.    Baseline  Pt. continues to have difficulty with reaching up for items    Time  12    Period  Weeks    Status  On-going    Target Date  05/05/19      OT LONG TERM GOAL  #12   TITLE  Pt. wil be independent with shaving using an electirc razer.    Baseline  Pt.'s barber is no longer shaving pt. Pt. is unable to complete.    Time  12    Period  Weeks    Status  New    Target Date  05/05/19            Plan - 02/10/19 1743    Clinical Impression Statement Pt. is making progress with ADLs, toileting, and clothing negotiation. Pt. continues to have difficulty with LE dressing, donning his jacket, and shaving. Pt. is making progress overall with RUE functional reach, right grip strength, and pinch strength. Pt.'s right hand [facility] skills continue to be limited. Pt.continues to work on improving RUE strength, and [facility] skills in order to improve bilateral functional hand use, functional reaching, and [facility] skills during ADLs, and IADL tasks.    OT Occupational Profile and History  Problem Focused Assessment - Including review of records relating to presenting  problem    Occupational performance deficits (Please refer to evaluation for details):  ADL's;IADL's    Body Structure / Function / Physical Skills  ADL;FMC;ROM;IADL;Pain;Coordination    Rehab Potential  Fair    Clinical Decision Making  Several treatment options, min-mod task modification necessary    OT Frequency  2x / week    OT Duration  12 weeks    OT Treatment/Interventions  Self-care/ADL training;Patient/family education;DME and/or AE instruction;Therapeutic exercise;Moist  Heat;Neuromuscular education    Consulted and Agree with Plan of Care  Patient       Patient will benefit from skilled therapeutic intervention in order to improve the following deficits and impairments:   Body Structure / Function / Physical Skills: ADL, FMC, ROM, IADL, Pain, Coordination       Visit Diagnosis: Muscle weakness (generalized)  Other lack of coordination    Problem List Patient Active Problem List   Diagnosis Date Noted  . Acute CVA (cerebrovascular accident) ([place]) 05/02/2016  . Left-sided weakness 05/01/2016    Harrel Carina, MS, OTR/L 02/10/2019, 5:51 PM  [employer] [place] MAIN [facility] SERVICES [address] [place], [place], [address] Phone: [phone]   Fax:  [phone]  Name: KAYVION SAVIN MRN: [id number] Date of Birth: 17-Sep-1965

## 2019-02-10 NOTE — Therapy (Signed)
[place] MAIN [facility] SERVICES [address] [place], [place], [address] Phone: [phone]   Fax:  [phone]  Physical Therapy Treatment/ Physical Therapy Progress Note/ RECERT   Dates of reporting period  01/04/19   to   02/10/19  Patient Details  Name: Curtis Powell MRN: [id number] Date of Birth: 1965-06-18 No data recorded  Encounter Date: 02/10/2019  PT End of Session - 02/11/19 1403    Visit Number  150    Number of Visits  166    Date for PT Re-Evaluation  04/08/19    Authorization Type  10/10 PN 01/04/19; next 1/10 PN 02/10/19    Authorization Time Period  authorized 11/16/18-05/19/19    PT Start Time  1430    PT Stop Time  1514    PT Time Calculation (min)  44 min    Equipment Utilized During Treatment  Gait belt    Activity Tolerance  Patient tolerated treatment well;Patient limited by fatigue    Behavior During Therapy  [facility] for tasks assessed/performed       Past Medical History:  Diagnosis Date  . GERD (gastroesophageal reflux disease)   . Hyperlipidemia   . Hypertension   . Obesity   . Stroke [facility])     Past Surgical History:  Procedure Laterality Date  . BRAIN SURGERY      There were no vitals filed for this visit.  Subjective Assessment - 02/11/19 1401    Subjective  Patient reports compliance with HEP, no falls or LOB since last session. Is excited to have received referral for AFO.    Pertinent History   Patient is a pleasant 53 year old male who presents to physical therapy for weakness and immobility secondary to CVA.  Had a stroke in Feb 2018. He was previously fully independent, but this stroke caused severe residual deficits, mainly on the right side as well as speech, and now he is unable to walk or perform most of his ADLs on his own. Entire right side is very weak. He still has a little difficulty with speech but his swallowing is improved to baseline. His mom had to move in with him and now is his main  caregiver.     How long can you sit comfortably?  5 minutes    How long can you stand comfortably?  5 minutes    How long can you walk comfortably?  10 ft    Patient Stated Goals  Walk without walker, walk further with walker, get some strength in back     Currently in Pain?  No/denies            10 MWT: 1 min 10 with one near LOB without AFO; 56 seconds with AFO  5x STS: 36 seconds first trial with one hand on w/c one hand on RW; 41 seconds second trial with fatigue and limited ability to place LLE.   BERG; deferred due to fatigue till next session   LEFS: 34/56  Ambulate >150 ft: 105 ft [employer] max AFO on RLE for foot position with improved gait mechanics and RW .Improved heel strike with decreased episodes of excessive knee hyperextension allows for improved weight shift and mobility.Facilitation provided at [facility] for optimal hip rotation and weight shift.   toileting: assistance with set up, able to perform once set up, challenged with negotiating threshold with wheelchair. Propulsion of manual chair with mod I and cueing for sequencing x 60 ft.   Donning/doffing [employer] max  AFO on RLE: patient requires mod/max assistance for donning/doffing. Will benefit from continued education on task.   Pt educated throughout session about proper posture and technique with exercises. Improved exercise technique, movement at target joints, use of target muscles after min to mod verbal, visual, tactile cues  Patient's condition has the potential to improve in response to therapy. Maximum improvement is yet to be obtained. The anticipated improvement is attainable and reasonable in a generally predictable time.  Patient reports he is moving better at home with decreased assistance required. Thinks PT is helping him walk better.                PT Education - 02/11/19 1402    Education provided  Yes    Education Details  goals, AFO referral    Person(s) Educated  Patient     Methods  Explanation;Demonstration;Tactile cues;Verbal cues;Handout    Comprehension  Verbalized understanding;Returned demonstration;Verbal cues required;Tactile cues required       PT Short Term Goals - 02/11/19 1404      PT SHORT TERM GOAL #1   Title  Patient will be independent in home exercise program to improve strength/mobility for better functional independence with ADLs.    Baseline  hep compliant    Time  2    Period  Weeks    Status  Achieved      PT SHORT TERM GOAL #2   Title  Patient will require min cueing for STS transfer with CGA for increased independence with mobility.     Baseline  CGA    Time  2    Period  Weeks    Status  Achieved      PT SHORT TERM GOAL #3   Title  Patient will maintain upright posture for > 15 seconds to demonstrate strengthened postural control muscualture     Baseline  18 seconds     Time  2    Period  Weeks    Status  Achieved        PT Long Term Goals - 02/11/19 0001      PT LONG TERM GOAL #1   Title  Patient will perform 10 MWT in < 1 minute to demonstrate improved ambulatory velocity and capacity.     Baseline  02/11/19: 56 seconds with AFO 4/10: 2 minutes 45 seconds; 5/29: 2 minute 31 seconds; 3 minutes 22 seconds 8/22: 3 minutes 2 seconds  9/12:  2 minutes and 43 seconds 10/15: 59mnute 12 seconds 11/7: 1 minute 21 seconds 12/19: 1 min 16 seconds 3/3: 57 seconds; 08/26/18: 1:41.4s= 0.10 m/s 7/1: 1 minute 14 seconds 9/14: 61 seconds    Time  8    Period  Weeks    Status  Achieved      PT LONG TERM GOAL #2   Title  Patient (< 650years old) will complete five times sit to stand test in < 10 seconds indicating an increased LE strength and improved balance.    Baseline  02/11/19: 36 seconds 5/19: 20.6s from WCrestline one hand on walker and one on WC, 80% upright posture 7/1: 22 seconds one hand on walker one hand on w/c  9/14: 53 seconds with full upright posture    Time  8    Period  Weeks    Status  Partially Met    Target Date   04/08/19      PT LONG TERM GOAL #3   Title  Patient will increase Berg Balance score by >  6 points (26/56) to demonstrate decreased fall risk during functional activities.    Baseline  9/12: 20/56 10/15: 21/56 11/7: 20/56 12/19: 22/56; 08/26/18: 11/56 7/1: 17/56  9/14: 23/56    Time  8    Period  Weeks    Status  Partially Met    Target Date  04/08/19      PT LONG TERM GOAL #4   Title  Patient will increase BLE gross strength to 4+/5 as to improve functional strength for independent gait, increased standing tolerance and increased ADL ability.    Baseline   02/11/19: hip flexion 4/5 hip extension and abduction 2+/54-/5 RLE, 10/15: 4/5 RLE 11/7: 4/5 RLE 3/3: 4/5 7/1:  hip flexion 4/5 hip extension 2/5 hip abduction/adduciton 2/5     Time  8    Period  Weeks    Status  Partially Met    Target Date  04/08/19      PT LONG TERM GOAL #5   Title  Patient will increase lower extremity functional scale to >40/80 to demonstrate improved functional mobility and increased tolerance with ADLs.     Baseline  02/10/19: 34/56 14/80; 12/26: 21/80; 2/18: 28/80  4/10; 25/80 5/29: 27/80; 7/16: 29/80 8/22: 30/80  9/12: 26/80 (feeling more tired due to moving) 10/15: 37/80 11/7: 23/80 3/3: 32/80; 08/25/18: 28/80 9/15: 32/56    Time  8    Period  Weeks    Status  Partially Met    Target Date  04/08/19      PT LONG TERM GOAL #6   Title  Patient will ambulate 150 ft with least assistive device and Supervision with no breaks to allow for increased mobility within home.    Baseline  11/4: 105 ft with AFO after 10 MWT 11/7: 156f 12/19: 115 ft  3/3: will assess next session, has done 200 ft with one rest break; 08/26/18: Pt fatigued after 30' today 9/14: 100 ft with CGA    Time  8    Period  Weeks    Status  Partially Met    Target Date  04/08/19      PT LONG TERM GOAL #7   Title  Patient will perform 10 MWT in <30 seconds for improved gait speed, gait mechanics, and functional capacity for mobility.     Baseline   11/4: 56 seconds with AFO and RW     Time  8    Period  Weeks    Status  New    Target Date  04/08/19            Plan - 02/11/19 1415    Clinical Impression Statement  Patient decreased 10 MWT time meeting current goal and allowing for progression of goal, improving gait speed to less than a minute to perform for first time since pre-COVID. Patient is improving his ability to transfer with decreased posterior lean and loss of balance with fatigue. Patient will perform the  BERG next session. . Patient unable to complete BERG this session due to fatigue after previous tests. Patient has received referral for AFO and educated on process for obtaining AFO.  Patient's condition has the potential to improve in response to therapy. Maximum improvement is yet to be obtained. The anticipated improvement is attainable and reasonable in a generally predictable time. Pt will continue to benefit from skilled PT services to address strength, balance and mobility deficits in order to improve function at home.    Rehab Potential  Fair  Clinical Impairments Affecting Rehab Potential  hx of HTN, HLD, CVA, learning disability, Diabetes, brain tumor,     PT Frequency  2x / week    PT Duration  8 weeks    PT Treatment/Interventions  ADLs/Self Care Home Management;Aquatic Therapy;Ultrasound;Moist Heat;Traction;DME Instruction;Gait training;Stair training;Functional mobility training;Therapeutic activities;Therapeutic exercise;Orthotic Fit/Training;Neuromuscular re-education;Balance training;Patient/family education;Manual techniques;Wheelchair mobility training;Passive range of motion;Energy conservation;Taping;Visual/perceptual remediation/compensation    PT Next Visit Plan  progress ambulation for duration    PT Home Exercise Plan  TrA contraction, glute sets: needs updates. Potentially some time in supine flat in hospital bed, to improve hip extension deficits.     Consulted and Agree with Plan of Care   Patient    Family Member Consulted  mother       Patient will benefit from skilled therapeutic intervention in order to improve the following deficits and impairments:  Abnormal gait, Decreased activity tolerance, Decreased balance, Decreased knowledge of precautions, Decreased endurance, Decreased coordination, Decreased knowledge of use of DME, Decreased mobility, Decreased range of motion, Difficulty walking, Decreased safety awareness, Decreased strength, Impaired flexibility, Impaired perceived functional ability, Impaired tone, Postural dysfunction, Improper body mechanics, Pain  Visit Diagnosis: Muscle weakness (generalized)  Other lack of coordination  Other abnormalities of gait and mobility  Hemiplegia and hemiparesis following cerebral infarction affecting right dominant side [facility])     Problem List Patient Active Problem List   Diagnosis Date Noted  . Acute CVA (cerebrovascular accident) ([place]) 05/02/2016  . Left-sided weakness 05/01/2016   Janna Arch, PT, DPT   02/11/2019, 2:16 PM  [place] MAIN [facility] SERVICES [address] [place], [place], [address] Phone: [phone]   Fax:  [phone]  Name: Curtis Powell MRN: [id number] Date of Birth: [birth year]

## 2019-02-15 ENCOUNTER — Other Ambulatory Visit: Payer: Self-pay

## 2019-02-15 ENCOUNTER — Encounter: Payer: Self-pay | Admitting: Occupational Therapy

## 2019-02-15 ENCOUNTER — Ambulatory Visit: Payer: Medicare HMO | Admitting: Occupational Therapy

## 2019-02-15 ENCOUNTER — Ambulatory Visit: Payer: Medicare HMO

## 2019-02-15 DIAGNOSIS — R278 Other lack of coordination: Secondary | ICD-10-CM

## 2019-02-15 DIAGNOSIS — I69351 Hemiplegia and hemiparesis following cerebral infarction affecting right dominant side: Secondary | ICD-10-CM

## 2019-02-15 DIAGNOSIS — R2689 Other abnormalities of gait and mobility: Secondary | ICD-10-CM

## 2019-02-15 DIAGNOSIS — M6281 Muscle weakness (generalized): Secondary | ICD-10-CM

## 2019-02-15 NOTE — Therapy (Signed)
[place] MAIN [facility] SERVICES [address] [place], [place], [address] Phone: [phone]   Fax:  [phone]  Physical Therapy Treatment  Patient Details  Name: Curtis Powell MRN: [id number] Date of Birth: February 01, 1966 No data recorded  Encounter Date: 02/15/2019  PT End of Session - 02/15/19 1537    Visit Number  151    Number of Visits  166    Date for PT Re-Evaluation  04/08/19    Authorization Type  1/10 PN 02/10/19    Authorization Time Period  authorized 11/16/18-05/19/19    PT Start Time  1516    PT Stop Time  1559    PT Time Calculation (min)  43 min    Equipment Utilized During Treatment  Gait belt    Activity Tolerance  Patient tolerated treatment well;Patient limited by fatigue    Behavior During Therapy  [facility] for tasks assessed/performed       Past Medical History:  Diagnosis Date  . GERD (gastroesophageal reflux disease)   . Hyperlipidemia   . Hypertension   . Obesity   . Stroke [facility])     Past Surgical History:  Procedure Laterality Date  . BRAIN SURGERY      There were no vitals filed for this visit.  Subjective Assessment - 02/15/19 1532    Subjective  Patient reports no falls or LOB, has been doing seated HEP but not standing ones. Mother is aware of process for recieving AFO    Pertinent History   Patient is a pleasant 53 year old male who presents to physical therapy for weakness and immobility secondary to CVA.  Had a stroke in Feb 2018. He was previously fully independent, but this stroke caused severe residual deficits, mainly on the right side as well as speech, and now he is unable to walk or perform most of his ADLs on his own. Entire right side is very weak. He still has a little difficulty with speech but his swallowing is improved to baseline. His mom had to move in with him and now is his main caregiver.     How long can you sit comfortably?  5 minutes    How long can you stand comfortably?  5 minutes     How long can you walk comfortably?  10 ft    Patient Stated Goals  Walk without walker, walk further with walker, get some strength in back     Currently in Pain?  No/denies         [facility] PT Assessment - 02/15/19 0001      Standardized Balance Assessment   Standardized Balance Assessment  --     BERG: deferred due to unsteadiness-patient encouraged to practice standing HEP   In // bars; CGA and occasional Min A to return to COM due to posterior LOB  Static stand 3x 30 seconds with decreasing UE support   Static stand SUE support with head turns 30 seconds x 2 trials.   Static stand SUE support with vertical head turns 30 seconds x 2 trials  BUE support with marching focus on high knee and single limb stance with upright posture x 10 each LE  Side stepping 2x length of // bars, cueing for upright posture, very challenging to patient for foot clearance with occasional hyperextension of bilateral knees.   BUE support standing hip extension 10x each LE, cueing for upright posture; very challenging to patient: HR elevated to 118  BUE support 2" step toe  taps 10x each LE; HR increase to 124 so terminated after 8x each LE, rapidly returned to therapeutic range with seated rest breaks.   Seated:  Crossed arms with upright posture marching 20x each LE Seated balloon taps reaching inside/outside BOS 2 minutes GTB hamstring curls 15x each LE    toileting: assistance with set up, able to perform once set up, challenged with negotiating threshold with wheelchair. Propulsion of manual chair with mod I and cueing for sequencing x 20 ft.  Purse lipped breathing education for reduction of HR elevation s/p interventions    Pt educated throughout session about proper posture and technique with exercises. Improved exercise technique, movement at target joints, use of target muscles after min to mod verbal, visual, tactile cues  Patient is fatigued more frequently today, upon monitoring vitals  patient experienced elevated HR throughout session requiring rest breaks to regain normal therapeutic range. BERG deferred due to vitals/instability. Pt will continue to benefit from skilled PT services to address strength, balance and mobility deficits in order to improve function at home.                  PT Education - 02/15/19 1533    Education provided  Yes    Education Details  exercise technique, body mechanics    Person(s) Educated  Patient    Methods  Explanation;Demonstration;Tactile cues;Verbal cues    Comprehension  Verbalized understanding;Returned demonstration;Verbal cues required;Tactile cues required       PT Short Term Goals - 02/11/19 1404      PT SHORT TERM GOAL #1   Title  Patient will be independent in home exercise program to improve strength/mobility for better functional independence with ADLs.    Baseline  hep compliant    Time  2    Period  Weeks    Status  Achieved      PT SHORT TERM GOAL #2   Title  Patient will require min cueing for STS transfer with CGA for increased independence with mobility.     Baseline  CGA    Time  2    Period  Weeks    Status  Achieved      PT SHORT TERM GOAL #3   Title  Patient will maintain upright posture for > 15 seconds to demonstrate strengthened postural control muscualture     Baseline  18 seconds     Time  2    Period  Weeks    Status  Achieved        PT Long Term Goals - 02/11/19 0001      PT LONG TERM GOAL #1   Title  Patient will perform 10 MWT in < 1 minute to demonstrate improved ambulatory velocity and capacity.     Baseline  02/11/19: 56 seconds with AFO 4/10: 2 minutes 45 seconds; 5/29: 2 minute 31 seconds; 3 minutes 22 seconds 8/22: 3 minutes 2 seconds  9/12:  2 minutes and 43 seconds 10/15: 24mnute 12 seconds 11/7: 1 minute 21 seconds 12/19: 1 min 16 seconds 3/3: 57 seconds; 08/26/18: 1:41.4s= 0.10 m/s 7/1: 1 minute 14 seconds 9/14: 61 seconds    Time  8    Period  Weeks    Status   Achieved      PT LONG TERM GOAL #2   Title  Patient (< 621years old) will complete five times sit to stand test in < 10 seconds indicating an increased LE strength and improved balance.    Baseline  02/11/19: 36 seconds 5/19: 20.6s from IXL, one hand on walker and one on WC, 80% upright posture 7/1: 22 seconds one hand on walker one hand on w/c  9/14: 53 seconds with full upright posture    Time  8    Period  Weeks    Status  Partially Met    Target Date  04/08/19      PT LONG TERM GOAL #3   Title  Patient will increase Berg Balance score by > 6 points (26/56) to demonstrate decreased fall risk during functional activities.    Baseline  9/12: 20/56 10/15: 21/56 11/7: 20/56 12/19: 22/56; 08/26/18: 11/56 7/1: 17/56  9/14: 23/56    Time  8    Period  Weeks    Status  Partially Met    Target Date  04/08/19      PT LONG TERM GOAL #4   Title  Patient will increase BLE gross strength to 4+/5 as to improve functional strength for independent gait, increased standing tolerance and increased ADL ability.    Baseline   02/11/19: hip flexion 4/5 hip extension and abduction 2+/54-/5 RLE, 10/15: 4/5 RLE 11/7: 4/5 RLE 3/3: 4/5 7/1:  hip flexion 4/5 hip extension 2/5 hip abduction/adduciton 2/5     Time  8    Period  Weeks    Status  Partially Met    Target Date  04/08/19      PT LONG TERM GOAL #5   Title  Patient will increase lower extremity functional scale to >40/80 to demonstrate improved functional mobility and increased tolerance with ADLs.     Baseline  02/10/19: 34/56 14/80; 12/26: 21/80; 2/18: 28/80  4/10; 25/80 5/29: 27/80; 7/16: 29/80 8/22: 30/80  9/12: 26/80 (feeling more tired due to moving) 10/15: 37/80 11/7: 23/80 3/3: 32/80; 08/25/18: 28/80 9/15: 32/56    Time  8    Period  Weeks    Status  Partially Met    Target Date  04/08/19      PT LONG TERM GOAL #6   Title  Patient will ambulate 150 ft with least assistive device and Supervision with no breaks to allow for increased mobility  within home.    Baseline  11/4: 105 ft with AFO after 10 MWT 11/7: 169f 12/19: 115 ft  3/3: will assess next session, has done 200 ft with one rest break; 08/26/18: Pt fatigued after 30' today 9/14: 100 ft with CGA    Time  8    Period  Weeks    Status  Partially Met    Target Date  04/08/19      PT LONG TERM GOAL #7   Title  Patient will perform 10 MWT in <30 seconds for improved gait speed, gait mechanics, and functional capacity for mobility.     Baseline  11/4: 56 seconds with AFO and RW     Time  8    Period  Weeks    Status  New    Target Date  04/08/19            Plan - 02/15/19 1543    Clinical Impression Statement  Patient is fatigued more frequently today, upon monitoring vitals patient experienced elevated HR throughout session requiring rest breaks to regain normal therapeutic range. BERG deferred due to vitals/instability. Pt will continue to benefit from skilled PT services to address strength, balance and mobility deficits in order to improve function at home.    Rehab Potential  Fair  Clinical Impairments Affecting Rehab Potential  hx of HTN, HLD, CVA, learning disability, Diabetes, brain tumor,     PT Frequency  2x / week    PT Duration  8 weeks    PT Treatment/Interventions  ADLs/Self Care Home Management;Aquatic Therapy;Ultrasound;Moist Heat;Traction;DME Instruction;Gait training;Stair training;Functional mobility training;Therapeutic activities;Therapeutic exercise;Orthotic Fit/Training;Neuromuscular re-education;Balance training;Patient/family education;Manual techniques;Wheelchair mobility training;Passive range of motion;Energy conservation;Taping;Visual/perceptual remediation/compensation    PT Next Visit Plan  balance    PT Home Exercise Plan  TrA contraction, glute sets: needs updates. Potentially some time in supine flat in hospital bed, to improve hip extension deficits.     Consulted and Agree with Plan of Care  Patient    Family Member Consulted  mother        Patient will benefit from skilled therapeutic intervention in order to improve the following deficits and impairments:  Abnormal gait, Decreased activity tolerance, Decreased balance, Decreased knowledge of precautions, Decreased endurance, Decreased coordination, Decreased knowledge of use of DME, Decreased mobility, Decreased range of motion, Difficulty walking, Decreased safety awareness, Decreased strength, Impaired flexibility, Impaired perceived functional ability, Impaired tone, Postural dysfunction, Improper body mechanics, Pain  Visit Diagnosis: Muscle weakness (generalized)  Other lack of coordination  Other abnormalities of gait and mobility  Hemiplegia and hemiparesis following cerebral infarction affecting right dominant side [facility])     Problem List Patient Active Problem List   Diagnosis Date Noted  . Acute CVA (cerebrovascular accident) ([place]) 05/02/2016  . Left-sided weakness 05/01/2016   Janna Arch, PT, DPT   02/15/2019, 3:58 PM  [place] MAIN [facility] SERVICES [address] [place], [place], [address] Phone: [phone]   Fax:  [phone]  Name: Curtis Powell MRN: [id number] Date of Birth: 1965-09-14

## 2019-02-15 NOTE — Therapy (Signed)
[place] MAIN [facility] SERVICES [address] [place], [place], [address] Phone: [phone]   Fax:  [phone]  Occupational Therapy Treatment  Patient Details  Name: Curtis Powell MRN: [id number] Date of Birth: [birth year] No data recorded  Encounter Date: 02/15/2019  OT End of Session - 02/15/19 1441    Visit Number  146    Number of Visits  161    Date for OT Re-Evaluation  05/05/19    Authorization Type  Progress reporting period starting 12/09/2018    OT Start Time  1430    OT Stop Time  1515    OT Time Calculation (min)  45 min    Equipment Utilized During Treatment  Long handled shoe horn    Activity Tolerance  Patient tolerated treatment well    Behavior During Therapy  WFL for tasks assessed/performed       Past Medical History:  Diagnosis Date  . GERD (gastroesophageal reflux disease)   . Hyperlipidemia   . Hypertension   . Obesity   . Stroke [facility])     Past Surgical History:  Procedure Laterality Date  . BRAIN SURGERY      There were no vitals filed for this visit.  Subjective Assessment - 02/15/19 1437    Subjective   Pt reports no pain today    Patient is accompanied by:  Family member    Pertinent History  Pt. is a 53 y.o. male who suffered a CVA on 05/01/2016. Pt. was admitted to the hospital. Once discharged, he received Home Health PT and OT services for about a month. Pt. has had multiple CVAs over the past 8 years, and has had multiple falls in the past 6 months. Pt. resides in an apartment. Pt. has caregivers for 80 hours. Pt.'s mother stays with pt. at night, and assists with IADL tasks.      Currently in Pain?  No/denies      OT TREATMENT    Neuro muscular re-education:  Pt. worked on reaching using the shape tower. Pt. grasped and moved the shapes through 3 vertical dowels of varying heights.  Selfcare:  Pt. worked on threading a needle, and sewing a straight line. Pt. required increased assist to  thread a needle, as the needle became unthreaded multiple times. Pt. Had difficulty sewing a straight line. Increased time was required.   Response to Treatment:  Pt. is making steady progress. Pt. is now using his RUE more during bathing tasks. Pt. presents with limited RUE functional reach, and requires cues to avoid compensating proximally when reaching. Pt. continues to work on improving BUE strength, and [facility] skills in order to work on improving, and increasing RUE engagement during ADLs, and IADL tasks.                          OT Education - 02/15/19 1440    Education provided  Yes    Education Details  UE reaching, Fine motor coordination skills    Person(s) Educated  Patient    Methods  Explanation;Demonstration    Comprehension  Verbalized understanding;Returned demonstration;Verbal cues required    Person(s) Educated  Patient    Methods  Explanation;Demonstration;Verbal cues;Tactile cues;Handout    Comprehension  Verbalized understanding;Returned demonstration;Verbal cues required;Tactile cues required;Need further instruction       OT Short Term Goals - 03/03/17 1459      OT SHORT TERM GOAL #1   Title  `  OT Long Term Goals - 02/10/19 1537      OT LONG TERM GOAL #4   Title  Pt. will perform self dressing with minA and A/E as needed.    Baseline  Pt. has improved with hiking pants/shorts in standing with modified independence. MinA-ModA socks, and minA donning pants over feet. MinA donning a jacket. Indpendent with a T-shirt    Time  12    Period  Weeks    Status  On-going    Target Date  05/05/19      Long Term Additional Goals   Additional Long Term Goals  Yes      OT LONG TERM GOAL #6   Title  Pt. will write his name efficiently with 100% legibility    Baseline  Pt continues to present with limited legibility and efficiency when writing name to sign in when riding the [place]    Time  12    Period  Weeks    Status  On-going    Target  Date  05/05/19      OT LONG TERM GOAL #7   Title  Pt will independently and consistently follow HEP to increase UE strength to increase functional independence    Baseline  Pt to continues to work towards independence with exercises    Period  Weeks    Status  On-going    Target Date  05/05/19      OT LONG TERM GOAL #8   Title  Pt. will require supervision ironing a shirt.    Baseline  Pt. requires CGA and complete set-up seated using a tabletop iron    Time  12    Period  Weeks    Status  On-going    Target Date  05/05/19      OT LONG TERM GOAL  #9   Baseline  Pt. will independently be able to sew a button onto a shirt.    Time  12    Period  Weeks    Status  On-going    Target Date  05/05/19      OT LONG TERM GOAL  #10   TITLE  Pt. will independently be able to throw a ball    Baseline  Pt. continues to be limited    Time  12    Period  Weeks    Status  On-going    Target Date  05/05/19      OT LONG TERM GOAL  #11   TITLE  Pt. will improve RUE functional reaching to be able to independently use his RUE to hand his clothes in the closest.    Baseline  Pt. continues to have difficulty with reaching up for items    Time  12    Period  Weeks    Status  On-going    Target Date  05/05/19      OT LONG TERM GOAL  #12   TITLE  Pt. wil be independent with shaving using an electirc razer.    Baseline  Pt.'s barber is no longer shaving pt. Pt. is unable to complete.    Time  12    Period  Weeks    Status  New    Target Date  05/05/19            Plan - 02/15/19 1444    Clinical Impression Statement  Pt. is making steady progress. Pt. is now using his RUE more during bathing tasks. Pt. presents with limited RUE functional  reach, and requires cues to avoid compensating proximally when reaching. Pt. continues to work on improving BUE strength, and [facility] skills in order to work on improving, and increasing RUE engagement during ADLs, and IADL tasks.    OT Occupational Profile  and History  Problem Focused Assessment - Including review of records relating to presenting problem    Occupational performance deficits (Please refer to evaluation for details):  ADL's;IADL's    Body Structure / Function / Physical Skills  ADL;FMC;ROM;IADL;Pain;Coordination    Rehab Potential  Fair    Clinical Decision Making  Several treatment options, min-mod task modification necessary    OT Frequency  2x / week    OT Duration  12 weeks    OT Treatment/Interventions  Self-care/ADL training;Patient/family education;DME and/or AE instruction;Therapeutic exercise;Moist Heat;Neuromuscular education    Consulted and Agree with Plan of Care  Patient       Patient will benefit from skilled therapeutic intervention in order to improve the following deficits and impairments:   Body Structure / Function / Physical Skills: ADL, FMC, ROM, IADL, Pain, Coordination       Visit Diagnosis: Muscle weakness (generalized)  Other lack of coordination    Problem List Patient Active Problem List   Diagnosis Date Noted  . Acute CVA (cerebrovascular accident) ([place]) 05/02/2016  . Left-sided weakness 05/01/2016    Harrel Carina, MS, OTR/L 02/15/2019, 3:10 PM  [place] MAIN [facility] SERVICES [address] [place], [place], [address] Phone: [phone]   Fax:  [phone]  Name: Curtis Powell MRN: [id number] Date of Birth: 1965/04/30

## 2019-02-17 ENCOUNTER — Ambulatory Visit: Payer: Medicare HMO

## 2019-02-17 ENCOUNTER — Ambulatory Visit: Payer: Medicare HMO | Admitting: Occupational Therapy

## 2019-02-17 ENCOUNTER — Other Ambulatory Visit: Payer: Self-pay

## 2019-02-17 ENCOUNTER — Encounter: Payer: Self-pay | Admitting: Occupational Therapy

## 2019-02-17 DIAGNOSIS — R278 Other lack of coordination: Secondary | ICD-10-CM

## 2019-02-17 DIAGNOSIS — M6281 Muscle weakness (generalized): Secondary | ICD-10-CM

## 2019-02-17 DIAGNOSIS — I69351 Hemiplegia and hemiparesis following cerebral infarction affecting right dominant side: Secondary | ICD-10-CM

## 2019-02-17 DIAGNOSIS — R2689 Other abnormalities of gait and mobility: Secondary | ICD-10-CM

## 2019-02-17 NOTE — Therapy (Signed)
[place] MAIN [facility] SERVICES [address] [place], [place], [address] Phone: [phone]   Fax:  [phone]  Occupational Therapy Treatment  Patient Details  Name: Curtis Powell MRN: [id number] Date of Birth: 1965/04/14 No data recorded  Encounter Date: 02/17/2019  OT End of Session - 02/17/19 1524    Visit Number  147    Number of Visits  161    Date for OT Re-Evaluation  05/05/19    OT Start Time  1521    OT Stop Time  1600    OT Time Calculation (min)  39 min    Equipment Utilized During Treatment  Long handled shoe horn    Activity Tolerance  Patient tolerated treatment well    Behavior During Therapy  WFL for tasks assessed/performed       Past Medical History:  Diagnosis Date  . GERD (gastroesophageal reflux disease)   . Hyperlipidemia   . Hypertension   . Obesity   . Stroke [facility])     Past Surgical History:  Procedure Laterality Date  . BRAIN SURGERY      There were no vitals filed for this visit.  Subjective Assessment - 02/17/19 1524    Subjective   Pt reports no pain today    Patient is accompanied by:  Family member    Pertinent History  Pt. is a 53 y.o. male who suffered a CVA on 05/01/2016. Pt. was admitted to the hospital. Once discharged, he received Home Health PT and OT services for about a month. Pt. has had multiple CVAs over the past 8 years, and has had multiple falls in the past 6 months. Pt. resides in an apartment. Pt. has caregivers for 80 hours. Pt.'s mother stays with pt. at night, and assists with IADL tasks.      Currently in Pain?  No/denies      OT TREATMENT    Neuro muscular re-education:  Pt. worked on reaching with his RUE using the shape tower. Pt. grasped and moved the shapes through 4 vertical dowels of varying heights. Pt. Worked on [facility] grasping 1/2" pegs, storing them in the palm of his hand while placing them into the pegboard with his 2nd digit, and thumb. Pt. worked on right hand  [facility] skills grasping 1" sticks, 1/4" collars, and 1/4" washers. Pt. worked on storing the objects in the palm, and translatory skills moving the items from the palm of the hand to the tip of the 2nd digit, and thumb. Pt. worked on removing the pegs using bilateral alternating hand patterns. Pt. Requires increased time to complete.  Pt. is making steady progress overall, and continues to engage is RUE more during ADLs, and IADL tasks at home. Pt. is improving with functional reaching, and presents with less compensation proximally. Pt. Continues to lean to the left, and occasionally hike his right shoulder. Pt. continues to work on improving right hand [facility] skills, and bilateral hand coordination during ADLs, and IADL tasks.                    OT Education - 02/17/19 1524    Education provided  Yes    Education Details  UE reaching, Fine motor coordination skills    Person(s) Educated  Patient    Methods  Explanation;Demonstration    Comprehension  Verbalized understanding;Returned demonstration;Verbal cues required       OT Short Term Goals - 03/03/17 1459      OT SHORT TERM  GOAL #1   Title  `        OT Long Term Goals - 02/10/19 1537      OT LONG TERM GOAL #4   Title  Pt. will perform self dressing with minA and A/E as needed.    Baseline  Pt. has improved with hiking pants/shorts in standing with modified independence. MinA-ModA socks, and minA donning pants over feet. MinA donning a jacket. Indpendent with a T-shirt    Time  12    Period  Weeks    Status  On-going    Target Date  05/05/19      Long Term Additional Goals   Additional Long Term Goals  Yes      OT LONG TERM GOAL #6   Title  Pt. will write his name efficiently with 100% legibility    Baseline  Pt continues to present with limited legibility and efficiency when writing name to sign in when riding the [place]    Time  12    Period  Weeks    Status  On-going    Target Date  05/05/19      OT LONG TERM  GOAL #7   Title  Pt will independently and consistently follow HEP to increase UE strength to increase functional independence    Baseline  Pt to continues to work towards independence with exercises    Period  Weeks    Status  On-going    Target Date  05/05/19      OT LONG TERM GOAL #8   Title  Pt. will require supervision ironing a shirt.    Baseline  Pt. requires CGA and complete set-up seated using a tabletop iron    Time  12    Period  Weeks    Status  On-going    Target Date  05/05/19      OT LONG TERM GOAL  #9   Baseline  Pt. will independently be able to sew a button onto a shirt.    Time  12    Period  Weeks    Status  On-going    Target Date  05/05/19      OT LONG TERM GOAL  #10   TITLE  Pt. will independently be able to throw a ball    Baseline  Pt. continues to be limited    Time  12    Period  Weeks    Status  On-going    Target Date  05/05/19      OT LONG TERM GOAL  #11   TITLE  Pt. will improve RUE functional reaching to be able to independently use his RUE to hand his clothes in the closest.    Baseline  Pt. continues to have difficulty with reaching up for items    Time  12    Period  Weeks    Status  On-going    Target Date  05/05/19      OT LONG TERM GOAL  #12   TITLE  Pt. wil be independent with shaving using an electirc razer.    Baseline  Pt.'s barber is no longer shaving pt. Pt. is unable to complete.    Time  12    Period  Weeks    Status  New    Target Date  05/05/19            Plan - 02/17/19 1526    Clinical Impression Statement Pt. is making steady progress overall, and continues  to engage is RUE more during ADLs, and IADL tasks at home. Pt. is improving with functional reaching, and presents with less compensation proximally. Pt. Continues to lean to the left, and occasionally hike his right shoulder. Pt. continues to work on improving right hand [facility] skills, and bilateral hand coordination during ADLs, and IADL tasks.    OT  Occupational Profile and History  Problem Focused Assessment - Including review of records relating to presenting problem    Occupational performance deficits (Please refer to evaluation for details):  ADL's;IADL's    Body Structure / Function / Physical Skills  ADL;FMC;ROM;IADL;Pain;Coordination    Rehab Potential  Fair    Clinical Decision Making  Several treatment options, min-mod task modification necessary    OT Frequency  2x / week    OT Duration  12 weeks    OT Treatment/Interventions  Self-care/ADL training;Patient/family education;DME and/or AE instruction;Therapeutic exercise;Moist Heat;Neuromuscular education    Consulted and Agree with Plan of Care  Patient       Patient will benefit from skilled therapeutic intervention in order to improve the following deficits and impairments:   Body Structure / Function / Physical Skills: ADL, FMC, ROM, IADL, Pain, Coordination       Visit Diagnosis: Muscle weakness (generalized)  Other lack of coordination    Problem List Patient Active Problem List   Diagnosis Date Noted  . Acute CVA (cerebrovascular accident) ([place]) 05/02/2016  . Left-sided weakness 05/01/2016    Harrel Carina, MS, OTR/L 02/17/2019, 3:36 PM  [place] MAIN [facility] SERVICES [address] [place], [place], [address] Phone: [phone]   Fax:  [phone]  Name: Curtis Powell MRN: [id number] Date of Birth: [birth year]

## 2019-02-17 NOTE — Therapy (Signed)
[place] MAIN [facility] SERVICES [address] [place], [place], [address] Phone: [phone]   Fax:  [phone]  Physical Therapy Treatment  Patient Details  Name: Curtis Powell MRN: [id number] Date of Birth: February 11, 1966 No data recorded  Encounter Date: 02/17/2019  PT End of Session - 02/17/19 1456    Visit Number  [id number]    Number of Visits  166    Date for PT Re-Evaluation  04/08/19    Authorization Type  2/10 PN 02/10/19    Authorization Time Period  authorized 11/16/18-05/19/19    PT Start Time  1430    PT Stop Time  1514    PT Time Calculation (min)  44 min    Equipment Utilized During Treatment  Gait belt    Activity Tolerance  Patient tolerated treatment well;Patient limited by fatigue    Behavior During Therapy  [facility] for tasks assessed/performed       Past Medical History:  Diagnosis Date  . GERD (gastroesophageal reflux disease)   . Hyperlipidemia   . Hypertension   . Obesity   . Stroke [facility])     Past Surgical History:  Procedure Laterality Date  . BRAIN SURGERY      There were no vitals filed for this visit.  Subjective Assessment - 02/17/19 1436    Subjective  Patient reports he is going to get the AFO fit next Thursday. No falls or LOB since last session.    Pertinent History   Patient is a pleasant 53 year old male who presents to physical therapy for weakness and immobility secondary to CVA.  Had a stroke in Feb 2018. He was previously fully independent, but this stroke caused severe residual deficits, mainly on the right side as well as speech, and now he is unable to walk or perform most of his ADLs on his own. Entire right side is very weak. He still has a little difficulty with speech but his swallowing is improved to baseline. His mom had to move in with him and now is his main caregiver.     How long can you sit comfortably?  5 minutes    How long can you stand comfortably?  5 minutes    How long can you walk  comfortably?  10 ft    Patient Stated Goals  Walk without walker, walk further with walker, get some strength in back     Currently in Pain?  No/denies          WATCH HR  Standing with RW: CGA and occasional Min A to return to COM due to posterior LOB   Static stand 1 min 12 seconds with no UE support    Static stand SUE support with head turns 30 seconds x 2 trials.    Static stand SUE support with vertical head turns 30 seconds x 2 trials   BUE support with marching with 5lb ankle weights focus on high knee and single limb stance with upright posture x 12 each LE; Cueing for upright posture      BUE support standing hip extension with 5lb ankle weights 10x each LE, cueing for upright posture; very challenging to patient:    BUE support 2" step toe taps 10x each LE; HR increase to 124 so terminated after 8x each LE, rapidly returned to therapeutic range with seated rest breaks.    Ambulate 96 ft with RW and wheelchair follow with Min A for Facilitation provided at PS[facility] for optimal  hip rotation and weight shift. Cueing for spatial awareness and upright position.   Seated:  Sit to stand with stabilization to walker provided 5x, challenging to patient.   Crossed arms with upright posture marching 20x each LE with 5lb ankle weights  LAQ 15x with 5lb ankle weights cueing for body mechanics and sequencing.   RTB abduction 20x with focus on slow muscle activation/recruitment.   Seated punches to PT hand/mitt reaching inside/outside BOS 2 minutes  GTB hamstring curls 15x each LE     toileting: assistance with set up, able to perform once set up, challenged with negotiating threshold with wheelchair. Propulsion of manual chair with mod I and cueing for sequencing x 20 ft.    Purse lipped breathing education for reduction of HR elevation s/p interventions    Pt educated throughout session about proper posture and technique with exercises. Improved exercise technique, movement at  target joints, use of target muscles after min to mod verbal, visual, tactile cues                      PT Education - 02/17/19 1455    Education provided  Yes    Education Details  exercise technique, body mechanics    Person(s) Educated  Patient    Methods  Explanation;Tactile cues;Demonstration;Verbal cues    Comprehension  Verbalized understanding;Other (comment);Returned demonstration;Verbal cues required;Tactile cues required       PT Short Term Goals - 02/11/19 1404      PT SHORT TERM GOAL #1   Title  Patient will be independent in home exercise program to improve strength/mobility for better functional independence with ADLs.    Baseline  hep compliant    Time  2    Period  Weeks    Status  Achieved      PT SHORT TERM GOAL #2   Title  Patient will require min cueing for STS transfer with CGA for increased independence with mobility.     Baseline  CGA    Time  2    Period  Weeks    Status  Achieved      PT SHORT TERM GOAL #3   Title  Patient will maintain upright posture for > 15 seconds to demonstrate strengthened postural control muscualture     Baseline  18 seconds     Time  2    Period  Weeks    Status  Achieved        PT Long Term Goals - 02/11/19 0001      PT LONG TERM GOAL #1   Title  Patient will perform 10 MWT in < 1 minute to demonstrate improved ambulatory velocity and capacity.     Baseline  02/11/19: 56 seconds with AFO 4/10: 2 minutes 45 seconds; 5/29: 2 minute 31 seconds; 3 minutes 22 seconds 8/22: 3 minutes 2 seconds  9/12:  2 minutes and 43 seconds 10/15: 74mnute 12 seconds 11/7: 1 minute 21 seconds 12/19: 1 min 16 seconds 3/3: 57 seconds; 08/26/18: 1:41.4s= 0.10 m/s 7/1: 1 minute 14 seconds 9/14: 61 seconds    Time  8    Period  Weeks    Status  Achieved      PT LONG TERM GOAL #2   Title  Patient (< 69years old) will complete five times sit to stand test in < 10 seconds indicating an increased LE strength and improved balance.     Baseline  02/11/19: 36 seconds 5/19: 20.6s from [place]  one hand on walker and one on WC, 80% upright posture 7/1: 22 seconds one hand on walker one hand on w/c  9/14: 53 seconds with full upright posture    Time  8    Period  Weeks    Status  Partially Met    Target Date  04/08/19      PT LONG TERM GOAL #3   Title  Patient will increase Berg Balance score by > 6 points (26/56) to demonstrate decreased fall risk during functional activities.    Baseline  9/12: 20/56 10/15: 21/56 11/7: 20/56 12/19: 22/56; 08/26/18: 11/56 7/1: 17/56  9/14: 23/56    Time  8    Period  Weeks    Status  Partially Met    Target Date  04/08/19      PT LONG TERM GOAL #4   Title  Patient will increase BLE gross strength to 4+/5 as to improve functional strength for independent gait, increased standing tolerance and increased ADL ability.    Baseline   02/11/19: hip flexion 4/5 hip extension and abduction 2+/54-/5 RLE, 10/15: 4/5 RLE 11/7: 4/5 RLE 3/3: 4/5 7/1:  hip flexion 4/5 hip extension 2/5 hip abduction/adduciton 2/5     Time  8    Period  Weeks    Status  Partially Met    Target Date  04/08/19      PT LONG TERM GOAL #5   Title  Patient will increase lower extremity functional scale to >40/80 to demonstrate improved functional mobility and increased tolerance with ADLs.     Baseline  02/10/19: 34/56 14/80; 12/26: 21/80; 2/18: 28/80  4/10; 25/80 5/29: 27/80; 7/16: 29/80 8/22: 30/80  9/12: 26/80 (feeling more tired due to moving) 10/15: 37/80 11/7: 23/80 3/3: 32/80; 08/25/18: 28/80 9/15: 32/56    Time  8    Period  Weeks    Status  Partially Met    Target Date  04/08/19      PT LONG TERM GOAL #6   Title  Patient will ambulate 150 ft with least assistive device and Supervision with no breaks to allow for increased mobility within home.    Baseline  11/4: 105 ft with AFO after 10 MWT 11/7: 156f 12/19: 115 ft  3/3: will assess next session, has done 200 ft with one rest break; 08/26/18: Pt fatigued after 30' today  9/14: 100 ft with CGA    Time  8    Period  Weeks    Status  Partially Met    Target Date  04/08/19      PT LONG TERM GOAL #7   Title  Patient will perform 10 MWT in <30 seconds for improved gait speed, gait mechanics, and functional capacity for mobility.     Baseline  11/4: 56 seconds with AFO and RW     Time  8    Period  Weeks    Status  New    Target Date  04/08/19            Plan - 02/17/19 1456    Clinical Impression Statement  Patient presents with improved energy and more stable heart rate this session allowing for progression of standing interventions with decreased need for rest breaks. Patient continues to be challenged with upright posture with activation of posterior LE chain. Pt will continue to benefit from skilled PT services to address strength, balance and mobility deficits in order to improve function at home.    Rehab Potential  Fair    Clinical Impairments Affecting Rehab Potential  hx of HTN, HLD, CVA, learning disability, Diabetes, brain tumor,     PT Frequency  2x / week    PT Duration  8 weeks    PT Treatment/Interventions  ADLs/Self Care Home Management;Aquatic Therapy;Ultrasound;Moist Heat;Traction;DME Instruction;Gait training;Stair training;Functional mobility training;Therapeutic activities;Therapeutic exercise;Orthotic Fit/Training;Neuromuscular re-education;Balance training;Patient/family education;Manual techniques;Wheelchair mobility training;Passive range of motion;Energy conservation;Taping;Visual/perceptual remediation/compensation    PT Next Visit Plan  balance    PT Home Exercise Plan  TrA contraction, glute sets: needs updates. Potentially some time in supine flat in hospital bed, to improve hip extension deficits.     Consulted and Agree with Plan of Care  Patient    Family Member Consulted  mother       Patient will benefit from skilled therapeutic intervention in order to improve the following deficits and impairments:  Abnormal gait,  Decreased activity tolerance, Decreased balance, Decreased knowledge of precautions, Decreased endurance, Decreased coordination, Decreased knowledge of use of DME, Decreased mobility, Decreased range of motion, Difficulty walking, Decreased safety awareness, Decreased strength, Impaired flexibility, Impaired perceived functional ability, Impaired tone, Postural dysfunction, Improper body mechanics, Pain  Visit Diagnosis: Muscle weakness (generalized)  Other lack of coordination  Other abnormalities of gait and mobility  Hemiplegia and hemiparesis following cerebral infarction affecting right dominant side [facility])     Problem List Patient Active Problem List   Diagnosis Date Noted  . Acute CVA (cerebrovascular accident) ([place]) 05/02/2016  . Left-sided weakness 05/01/2016   Janna Arch, PT, DPT   02/17/2019, 3:17 PM  [place] MAIN [facility] SERVICES [address] [place], [place], [address] Phone: [phone]   Fax:  [phone]  Name: LEEANDRE NORDLING MRN: [id number] Date of Birth: Jan 16, 1966

## 2019-02-22 ENCOUNTER — Other Ambulatory Visit: Payer: Self-pay

## 2019-02-22 ENCOUNTER — Encounter: Payer: Self-pay | Admitting: Occupational Therapy

## 2019-02-22 ENCOUNTER — Ambulatory Visit: Payer: Medicare HMO

## 2019-02-22 ENCOUNTER — Ambulatory Visit: Payer: Medicare HMO | Admitting: Occupational Therapy

## 2019-02-22 DIAGNOSIS — M6281 Muscle weakness (generalized): Secondary | ICD-10-CM | POA: Diagnosis not present

## 2019-02-22 DIAGNOSIS — R278 Other lack of coordination: Secondary | ICD-10-CM

## 2019-02-22 DIAGNOSIS — R2689 Other abnormalities of gait and mobility: Secondary | ICD-10-CM

## 2019-02-22 DIAGNOSIS — I69351 Hemiplegia and hemiparesis following cerebral infarction affecting right dominant side: Secondary | ICD-10-CM

## 2019-02-22 NOTE — Therapy (Signed)
[place] MAIN [facility] SERVICES [address] [place], [place], [address] Phone: [phone]   Fax:  [phone]  Occupational Therapy Treatment  Patient Details  Name: Curtis Powell MRN: [id number] Date of Birth: 06-23-1965 No data recorded  Encounter Date: 02/22/2019  OT End of Session - 02/22/19 1437    Visit Number  [id number]    Number of Visits  161    Date for OT Re-Evaluation  05/05/19    Authorization Type  Progress reporting period starting 12/09/2018    OT Start Time  1435    OT Stop Time  1515    OT Time Calculation (min)  40 min    Activity Tolerance  Patient tolerated treatment well    Behavior During Therapy  [facility] for tasks assessed/performed       Past Medical History:  Diagnosis Date  . GERD (gastroesophageal reflux disease)   . Hyperlipidemia   . Hypertension   . Obesity   . Stroke [facility])     Past Surgical History:  Procedure Laterality Date  . BRAIN SURGERY      There were no vitals filed for this visit.  Subjective Assessment - 02/22/19 1434    Subjective   Pt. reports feeling good    Patient is accompanied by:  Family member    Pertinent History  Pt. is a 53 y.o. male who suffered a CVA on 05/01/2016. Pt. was admitted to the hospital. Once discharged, he received Home Health PT and OT services for about a month. Pt. has had multiple CVAs over the past 8 years, and has had multiple falls in the past 6 months. Pt. resides in an apartment. Pt. has caregivers for 80 hours. Pt.'s mother stays with pt. at night, and assists with IADL tasks.      Currently in Pain?  No/denies       OT TREATMENT    Neuro muscular re-education:  Pt. worked on reaching with his RUE using the shape tower. Pt. grasped and moved the shapes through 4 vertical dowels of varying heights. Pt. Worked on [facility] grasping 1/2" pegs, storing them in the palm of his hand while placing them into the pegboard with his 2nd digit, and thumb. Pt. worked on  right hand [facility] skills grasping 1" sticks, 1/4" collars, and 1/4" washers. Pt. worked on storing the objects in the palm, and translatory skills moving the items from the palm of the hand to the tip of the 2nd digit, and thumb. Pt. worked on removing the pegs using bilateral alternating hand patterns. Pt. Requires increased time to complete.  Response to treatment:  Pt. continues to make steady progress. Pt.'s HR 98-105 bpms during seated reaching tasks with the RUE. 96% during right hand [facility] skills. Pt. continues to work on improving RUE strength, functional reach, motor control, [facility] skills in order to improve, and maximize independence with ADLs, and IADL tasks.                      OT Education - 02/22/19 1436    Education provided  Yes    Education Details  UE reaching, Fine motor coordination skills    Person(s) Educated  Patient    Methods  Explanation;Demonstration    Comprehension  Verbalized understanding;Returned demonstration;Verbal cues required    Education Details  UE ther. ex.    Person(s) Educated  Patient    Methods  Explanation;Demonstration;Tactile cues;Verbal cues    Comprehension  Verbalized understanding;Returned  demonstration;Verbal cues required;Need further instruction       OT Short Term Goals - 03/03/17 1459      OT SHORT TERM GOAL #1   Title  `        OT Long Term Goals - 02/10/19 1537      OT LONG TERM GOAL #4   Title  Pt. will perform self dressing with minA and A/E as needed.    Baseline  Pt. has improved with hiking pants/shorts in standing with modified independence. MinA-ModA socks, and minA donning pants over feet. MinA donning a jacket. Indpendent with a T-shirt    Time  12    Period  Weeks    Status  On-going    Target Date  05/05/19      Long Term Additional Goals   Additional Long Term Goals  Yes      OT LONG TERM GOAL #6   Title  Pt. will write his name efficiently with 100% legibility    Baseline  Pt continues to  present with limited legibility and efficiency when writing name to sign in when riding the [place]    Time  12    Period  Weeks    Status  On-going    Target Date  05/05/19      OT LONG TERM GOAL #7   Title  Pt will independently and consistently follow HEP to increase UE strength to increase functional independence    Baseline  Pt to continues to work towards independence with exercises    Period  Weeks    Status  On-going    Target Date  05/05/19      OT LONG TERM GOAL #8   Title  Pt. will require supervision ironing a shirt.    Baseline  Pt. requires CGA and complete set-up seated using a tabletop iron    Time  12    Period  Weeks    Status  On-going    Target Date  05/05/19      OT LONG TERM GOAL  #9   Baseline  Pt. will independently be able to sew a button onto a shirt.    Time  12    Period  Weeks    Status  On-going    Target Date  05/05/19      OT LONG TERM GOAL  #10   TITLE  Pt. will independently be able to throw a ball    Baseline  Pt. continues to be limited    Time  12    Period  Weeks    Status  On-going    Target Date  05/05/19      OT LONG TERM GOAL  #11   TITLE  Pt. will improve RUE functional reaching to be able to independently use his RUE to hand his clothes in the closest.    Baseline  Pt. continues to have difficulty with reaching up for items    Time  12    Period  Weeks    Status  On-going    Target Date  05/05/19      OT LONG TERM GOAL  #12   TITLE  Pt. wil be independent with shaving using an electirc razer.    Baseline  Pt.'s barber is no longer shaving pt. Pt. is unable to complete.    Time  12    Period  Weeks    Status  New    Target Date  05/05/19  Plan - 02/22/19 1437    Clinical Impression Statement Pt. continues to make steady progress. Pt.'s HR 98-105 bpms during seated reaching tasks with the RUE. Pt. continues to work on improving RUE strength, functional reach, motor control, [facility] skills in order to improve, and  maximize independence with ADLs, and IADL tasks.    OT Occupational Profile and History  Problem Focused Assessment - Including review of records relating to presenting problem    Occupational performance deficits (Please refer to evaluation for details):  ADL's;IADL's    Body Structure / Function / Physical Skills  ADL;FMC;ROM;IADL;Pain;Coordination    Rehab Potential  Fair    Clinical Decision Making  Several treatment options, min-mod task modification necessary    OT Frequency  2x / week    OT Duration  12 weeks    OT Treatment/Interventions  Self-care/ADL training;Patient/family education;DME and/or AE instruction;Therapeutic exercise;Moist Heat;Neuromuscular education    Consulted and Agree with Plan of Care  Patient       Patient will benefit from skilled therapeutic intervention in order to improve the following deficits and impairments:   Body Structure / Function / Physical Skills: ADL, FMC, ROM, IADL, Pain, Coordination       Visit Diagnosis: Muscle weakness (generalized)  Other lack of coordination    Problem List Patient Active Problem List   Diagnosis Date Noted  . Acute CVA (cerebrovascular accident) ([place]) 05/02/2016  . Left-sided weakness 05/01/2016    Harrel Carina, MS,  OTR/L 02/22/2019, 2:52 PM  [place] MAIN [facility] SERVICES [address] [place], [place], [address] Phone: [phone]   Fax:  [phone]  Name: Curtis Powell MRN: [id number] Date of Birth: February 07, 1966

## 2019-02-22 NOTE — Therapy (Signed)
[place] MAIN [facility] SERVICES [address] [place], [place], [address] Phone: [phone]   Fax:  [phone]  Physical Therapy Treatment  Patient Details  Name: Curtis Powell MRN: [id number] Date of Birth: 09/08/1965 No data recorded  Encounter Date: 02/22/2019  PT End of Session - 02/22/19 1352    Visit Number  [id number]    Number of Visits  166    Date for PT Re-Evaluation  04/08/19    Authorization Type  3/10 PN 02/10/19    Authorization Time Period  authorized 11/16/18-05/19/19    PT Start Time  1345    PT Stop Time  1429    PT Time Calculation (min)  44 min    Equipment Utilized During Treatment  Gait belt    Activity Tolerance  Patient tolerated treatment well;Patient limited by fatigue    Behavior During Therapy  [facility] for tasks assessed/performed       Past Medical History:  Diagnosis Date  . GERD (gastroesophageal reflux disease)   . Hyperlipidemia   . Hypertension   . Obesity   . Stroke [facility])     Past Surgical History:  Procedure Laterality Date  . BRAIN SURGERY      There were no vitals filed for this visit.  Subjective Assessment - 02/22/19 1350    Subjective  Patient reports he is getting his AFO fit on Thursday. No falls or stumbles since last session. Has been doing his HEP.    Pertinent History   Patient is a pleasant 53 year old male who presents to physical therapy for weakness and immobility secondary to CVA.  Had a stroke in Feb 2018. He was previously fully independent, but this stroke caused severe residual deficits, mainly on the right side as well as speech, and now he is unable to walk or perform most of his ADLs on his own. Entire right side is very weak. He still has a little difficulty with speech but his swallowing is improved to baseline. His mom had to move in with him and now is his main caregiver.     How long can you sit comfortably?  5 minutes    How long can you stand comfortably?  5 minutes    How  long can you walk comfortably?  10 ft    Patient Stated Goals  Walk without walker, walk further with walker, get some strength in back     Currently in Pain?  No/denies        Nustep LVl 4 4 minutes RPM>70 for cardiovascular challenge; HR elevated to 124  SPT with min a for stabilization of walker and cueing for upright posture x 2  Standing in // bars: CGA and occasional Min A to return to COM due to posterior LOB  side step in // bars 2x length of // bars, max cueing for hips forward. Terminated after 2 lengths due to HR > 132.     Ambulate 125 ft with RW and wheelchair follow with Min A for Facilitation provided at [facility] for optimal hip rotation and weight shift. Cueing for spatial awareness and upright position.    Seated:  Sit to stand with stabilization to walker provided 5x, challenging to patient.    Crossed arms with upright posture marching 20x each LE with 5lb ankle weights     GTB abduction 20x with focus on slow muscle activation/recruitment.      GTB hamstring curls 15x each LE  toileting: assistance with set up, able to perform once set up, challenged with negotiating threshold with wheelchair. Propulsion of manual chair with mod I and cueing for sequencing x 20 ft.    Purse lipped breathing education for reduction of HR elevation s/p interventions     Pt educated throughout session about proper posture and technique with exercises. Improved exercise technique, movement at target joints, use of target muscles after min to mod verbal, visual, tactile cues                      PT Education - 02/22/19 1351    Education provided  Yes    Education Details  exercise technique, body mechanics    Person(s) Educated  Patient    Methods  Explanation;Tactile cues;Demonstration;Verbal cues    Comprehension  Verbalized understanding;Returned demonstration;Verbal cues required;Tactile cues required       PT Short Term Goals - 02/11/19 1404      PT  SHORT TERM GOAL #1   Title  Patient will be independent in home exercise program to improve strength/mobility for better functional independence with ADLs.    Baseline  hep compliant    Time  2    Period  Weeks    Status  Achieved      PT SHORT TERM GOAL #2   Title  Patient will require min cueing for STS transfer with CGA for increased independence with mobility.     Baseline  CGA    Time  2    Period  Weeks    Status  Achieved      PT SHORT TERM GOAL #3   Title  Patient will maintain upright posture for > 15 seconds to demonstrate strengthened postural control muscualture     Baseline  18 seconds     Time  2    Period  Weeks    Status  Achieved        PT Long Term Goals - 02/11/19 0001      PT LONG TERM GOAL #1   Title  Patient will perform 10 MWT in < 1 minute to demonstrate improved ambulatory velocity and capacity.     Baseline  02/11/19: 56 seconds with AFO 4/10: 2 minutes 45 seconds; 5/29: 2 minute 31 seconds; 3 minutes 22 seconds 8/22: 3 minutes 2 seconds  9/12:  2 minutes and 43 seconds 10/15: 84mnute 12 seconds 11/7: 1 minute 21 seconds 12/19: 1 min 16 seconds 3/3: 57 seconds; 08/26/18: 1:41.4s= 0.10 m/s 7/1: 1 minute 14 seconds 9/14: 61 seconds    Time  8    Period  Weeks    Status  Achieved      PT LONG TERM GOAL #2   Title  Patient (< 625years old) will complete five times sit to stand test in < 10 seconds indicating an increased LE strength and improved balance.    Baseline  02/11/19: 36 seconds 5/19: 20.6s from [place] one hand on walker and one on WC, 80% upright posture 7/1: 22 seconds one hand on walker one hand on w/c  9/14: 53 seconds with full upright posture    Time  8    Period  Weeks    Status  Partially Met    Target Date  04/08/19      PT LONG TERM GOAL #3   Title  Patient will increase Berg Balance score by > 6 points (26/56) to demonstrate decreased fall risk during functional activities.  Baseline  9/12: 20/56 10/15: 21/56 11/7: 20/56 12/19: 22/56;  08/26/18: 11/56 7/1: 17/56  9/14: 23/56    Time  8    Period  Weeks    Status  Partially Met    Target Date  04/08/19      PT LONG TERM GOAL #4   Title  Patient will increase BLE gross strength to 4+/5 as to improve functional strength for independent gait, increased standing tolerance and increased ADL ability.    Baseline   02/11/19: hip flexion 4/5 hip extension and abduction 2+/54-/5 RLE, 10/15: 4/5 RLE 11/7: 4/5 RLE 3/3: 4/5 7/1:  hip flexion 4/5 hip extension 2/5 hip abduction/adduciton 2/5     Time  8    Period  Weeks    Status  Partially Met    Target Date  04/08/19      PT LONG TERM GOAL #5   Title  Patient will increase lower extremity functional scale to >40/80 to demonstrate improved functional mobility and increased tolerance with ADLs.     Baseline  02/10/19: 34/56 14/80; 12/26: 21/80; 2/18: 28/80  4/10; 25/80 5/29: 27/80; 7/16: 29/80 8/22: 30/80  9/12: 26/80 (feeling more tired due to moving) 10/15: 37/80 11/7: 23/80 3/3: 32/80; 08/25/18: 28/80 9/15: 32/56    Time  8    Period  Weeks    Status  Partially Met    Target Date  04/08/19      PT LONG TERM GOAL #6   Title  Patient will ambulate 150 ft with least assistive device and Supervision with no breaks to allow for increased mobility within home.    Baseline  11/4: 105 ft with AFO after 10 MWT 11/7: 129f 12/19: 115 ft  3/3: will assess next session, has done 200 ft with one rest break; 08/26/18: Pt fatigued after 30' today 9/14: 100 ft with CGA    Time  8    Period  Weeks    Status  Partially Met    Target Date  04/08/19      PT LONG TERM GOAL #7   Title  Patient will perform 10 MWT in <30 seconds for improved gait speed, gait mechanics, and functional capacity for mobility.     Baseline  11/4: 56 seconds with AFO and RW     Time  8    Period  Weeks    Status  New    Target Date  04/08/19            Plan - 02/22/19 1420    Clinical Impression Statement  Patient presents with excellent motivation throughout  physical therapy session. He continues to be challenged with maintaining upright posture at this time with frequent trunk flexion limiting standing tolerance/stability. Patient HR elevated with standing interventions requiring seated rest breaks. Pt will continue to benefit from skilled PT services to address strength, balance and mobility deficits in order to improve function at home.    Rehab Potential  Fair    Clinical Impairments Affecting Rehab Potential  hx of HTN, HLD, CVA, learning disability, Diabetes, brain tumor,     PT Frequency  2x / week    PT Duration  8 weeks    PT Treatment/Interventions  ADLs/Self Care Home Management;Aquatic Therapy;Ultrasound;Moist Heat;Traction;DME Instruction;Gait training;Stair training;Functional mobility training;Therapeutic activities;Therapeutic exercise;Orthotic Fit/Training;Neuromuscular re-education;Balance training;Patient/family education;Manual techniques;Wheelchair mobility training;Passive range of motion;Energy conservation;Taping;Visual/perceptual remediation/compensation    PT Next Visit Plan  balance    PT Home Exercise Plan  TrA contraction, glute sets: needs  updates. Potentially some time in supine flat in hospital bed, to improve hip extension deficits.     Consulted and Agree with Plan of Care  Patient    Family Member Consulted  mother       Patient will benefit from skilled therapeutic intervention in order to improve the following deficits and impairments:  Abnormal gait, Decreased activity tolerance, Decreased balance, Decreased knowledge of precautions, Decreased endurance, Decreased coordination, Decreased knowledge of use of DME, Decreased mobility, Decreased range of motion, Difficulty walking, Decreased safety awareness, Decreased strength, Impaired flexibility, Impaired perceived functional ability, Impaired tone, Postural dysfunction, Improper body mechanics, Pain  Visit Diagnosis: Muscle weakness (generalized)  Other lack of  coordination  Other abnormalities of gait and mobility  Hemiplegia and hemiparesis following cerebral infarction affecting right dominant side [facility])     Problem List Patient Active Problem List   Diagnosis Date Noted  . Acute CVA (cerebrovascular accident) ([place]) 05/02/2016  . Left-sided weakness 05/01/2016   Janna Arch, PT, DPT   02/22/2019, 2:30 PM  [place] MAIN [facility] SERVICES [address] [place], [place], [address] Phone: [phone]   Fax:  [phone]  Name: Curtis Powell MRN: [id number] Date of Birth: 1965/07/05

## 2019-02-24 ENCOUNTER — Encounter: Payer: Self-pay | Admitting: Occupational Therapy

## 2019-02-24 ENCOUNTER — Ambulatory Visit: Payer: Medicare HMO | Admitting: Occupational Therapy

## 2019-02-24 ENCOUNTER — Other Ambulatory Visit: Payer: Self-pay

## 2019-02-24 ENCOUNTER — Ambulatory Visit: Payer: Medicare HMO

## 2019-02-24 DIAGNOSIS — R278 Other lack of coordination: Secondary | ICD-10-CM

## 2019-02-24 DIAGNOSIS — M6281 Muscle weakness (generalized): Secondary | ICD-10-CM

## 2019-02-24 DIAGNOSIS — I69351 Hemiplegia and hemiparesis following cerebral infarction affecting right dominant side: Secondary | ICD-10-CM

## 2019-02-24 DIAGNOSIS — R2689 Other abnormalities of gait and mobility: Secondary | ICD-10-CM

## 2019-02-24 NOTE — Therapy (Signed)
[place] MAIN [facility] SERVICES [address] [place], [place], [address] Phone: [phone]   Fax:  [phone]  Occupational Therapy Treatment  Patient Details  Name: Curtis Powell MRN: [id number] Date of Birth: [birth year] No data recorded  Encounter Date: 02/24/2019  OT End of Session - 02/24/19 1523    Visit Number  149    Number of Visits  161    Date for OT Re-Evaluation  05/05/19    Authorization Type  Progress reporting period starting 12/09/2018    OT Start Time  1518    OT Stop Time  1600    OT Time Calculation (min)  42 min    Activity Tolerance  Patient tolerated treatment well    Behavior During Therapy  [facility] for tasks assessed/performed       Past Medical History:  Diagnosis Date  . GERD (gastroesophageal reflux disease)   . Hyperlipidemia   . Hypertension   . Obesity   . Stroke [facility])     Past Surgical History:  Procedure Laterality Date  . BRAIN SURGERY      There were no vitals filed for this visit.  Subjective Assessment - 02/24/19 1522    Subjective   Pt. reports feeling good    Patient is accompanied by:  Family member    Pertinent History  Pt. is a 53 y.o. male who suffered a CVA on 05/01/2016. Pt. was admitted to the hospital. Once discharged, he received Home Health PT and OT services for about a month. Pt. has had multiple CVAs over the past 8 years, and has had multiple falls in the past 6 months. Pt. resides in an apartment. Pt. has caregivers for 80 hours. Pt.'s mother stays with pt. at night, and assists with IADL tasks.      Currently in Pain?  No/denies      OT TREATMENT    Neuro muscular re-education:  Pt. worked on RUE reaching using the [employer]. Pt. grasped and moved the shapes through 3 vertical dowels of varying heights. Pt. worked on reaching tasks in preparation for functional reaching needed for accessing items his closet, and cabinetry. Pt. worked on [facility] skills grasping 1" sticks, 1/4"  collars, and 1/4" washers. Pt. worked on storing the objects in the palm, and translatory skills moving the items from the palm of the hand to the tip of the 2nd digit, and thumb. Pt. worked on removing the pegs using bilateral alternating hand patterns.   Response to Treatment:  Pt. continues to make progress, and is engaging more during daily tasks at home. Pt. requires cues to maintain his trunk in midline when reaching with the RUE, as pt. leans to the left. Pt. Presented with increased leaning to the left initially during [facility] skills with his head tilted in lateral flexion. Pt. was able to self-correct with cues. Pt. continues to work on improving RUE functioning during ADLs, and IADL tasks to promote overall independence.                          OT Education - 02/24/19 1522    Education provided  Yes    Education Details  UE reaching, Fine motor coordination skills    Person(s) Educated  Patient    Methods  Explanation;Demonstration    Comprehension  Verbalized understanding;Returned demonstration;Verbal cues required       OT Short Term Goals - 03/03/17 1459  OT SHORT TERM GOAL #1   Title  `        OT Long Term Goals - 02/10/19 1537      OT LONG TERM GOAL #4   Title  Pt. will perform self dressing with minA and A/E as needed.    Baseline  Pt. has improved with hiking pants/shorts in standing with modified independence. MinA-ModA socks, and minA donning pants over feet. MinA donning a jacket. Indpendent with a T-shirt    Time  12    Period  Weeks    Status  On-going    Target Date  05/05/19      Long Term Additional Goals   Additional Long Term Goals  Yes      OT LONG TERM GOAL #6   Title  Pt. will write his name efficiently with 100% legibility    Baseline  Pt continues to present with limited legibility and efficiency when writing name to sign in when riding the Kim    Time  12    Period  Weeks    Status  On-going    Target Date  05/05/19       OT LONG TERM GOAL #7   Title  Pt will independently and consistently follow HEP to increase UE strength to increase functional independence    Baseline  Pt to continues to work towards independence with exercises    Period  Weeks    Status  On-going    Target Date  05/05/19      OT LONG TERM GOAL #8   Title  Pt. will require supervision ironing a shirt.    Baseline  Pt. requires CGA and complete set-up seated using a tabletop iron    Time  12    Period  Weeks    Status  On-going    Target Date  05/05/19      OT LONG TERM GOAL  #9   Baseline  Pt. will independently be able to sew a button onto a shirt.    Time  12    Period  Weeks    Status  On-going    Target Date  05/05/19      OT LONG TERM GOAL  #10   TITLE  Pt. will independently be able to throw a ball    Baseline  Pt. continues to be limited    Time  12    Period  Weeks    Status  On-going    Target Date  05/05/19      OT LONG TERM GOAL  #11   TITLE  Pt. will improve RUE functional reaching to be able to independently use his RUE to hand his clothes in the closest.    Baseline  Pt. continues to have difficulty with reaching up for items    Time  12    Period  Weeks    Status  On-going    Target Date  05/05/19      OT LONG TERM GOAL  #12   TITLE  Pt. wil be independent with shaving using an electirc razer.    Baseline  Pt.'s barber is no longer shaving pt. Pt. is unable to complete.    Time  12    Period  Weeks    Status  New    Target Date  05/05/19            Plan - 02/24/19 1524    Clinical Impression Statement Pt. continues to make progress,  and is engaging more during daily tasks at home. Pt. requires cues to maintain his trunk in midline when reaching with the RUE, as pt. leans to the left. Pt. Presented with increased leaning to the left initially during [facility] skills with his head tilted in lateral flexion. Pt. was able to self-correct with cues. Pt. continues to work on improving RUE functioning  during ADLs, and IADL tasks to promote overall independence..    OT Occupational Profile and History  Problem Focused Assessment - Including review of records relating to presenting problem    Occupational performance deficits (Please refer to evaluation for details):  ADL's;IADL's    Body Structure / Function / Physical Skills  ADL;FMC;ROM;IADL;Pain;Coordination    Rehab Potential  Fair    Clinical Decision Making  Several treatment options, min-mod task modification necessary    OT Frequency  2x / week    OT Duration  12 weeks    OT Treatment/Interventions  Self-care/ADL training;Patient/family education;DME and/or AE instruction;Therapeutic exercise;Moist Heat;Neuromuscular education    Consulted and Agree with Plan of Care  Patient       Patient will benefit from skilled therapeutic intervention in order to improve the following deficits and impairments:   Body Structure / Function / Physical Skills: ADL, FMC, ROM, IADL, Pain, Coordination       Visit Diagnosis: Muscle weakness (generalized)  Other lack of coordination    Problem List Patient Active Problem List   Diagnosis Date Noted  . Acute CVA (cerebrovascular accident) ([place]) 05/02/2016  . Left-sided weakness 05/01/2016    Harrel Carina, MS, OTR/L 02/24/2019, 3:35 PM  [place] MAIN [facility] SERVICES [address] [place], [place], [address] Phone: [phone]   Fax:  [phone]  Name: Curtis Powell MRN: [id number] Date of Birth: 1965-04-09

## 2019-02-24 NOTE — Therapy (Signed)
[place] MAIN [facility] SERVICES [address] [place], [place], [address] Phone: [phone]   Fax:  [phone]  Physical Therapy Treatment  Patient Details  Name: Curtis Powell MRN: [id number] Date of Birth: 15-Dec-1965 No data recorded  Encounter Date: 02/24/2019  PT End of Session - 02/24/19 1438    Visit Number  154    Number of Visits  166    Date for PT Re-Evaluation  04/08/19    Authorization Type  4/10 PN 02/10/19    Authorization Time Period  authorized 11/16/18-05/19/19    PT Start Time  1430    PT Stop Time  1513    PT Time Calculation (min)  43 min    Equipment Utilized During Treatment  Gait belt    Activity Tolerance  Patient tolerated treatment well;Patient limited by fatigue    Behavior During Therapy  [facility] for tasks assessed/performed       Past Medical History:  Diagnosis Date  . GERD (gastroesophageal reflux disease)   . Hyperlipidemia   . Hypertension   . Obesity   . Stroke [facility])     Past Surgical History:  Procedure Laterality Date  . BRAIN SURGERY      There were no vitals filed for this visit.  Subjective Assessment - 02/24/19 1437    Subjective  Patient reports his mom took his BP and HR after his last session and it was normal. Has been compliant with HEP, no falls or LOB since last session.    Pertinent History   Patient is a pleasant 53 year old male who presents to physical therapy for weakness and immobility secondary to CVA.  Had a stroke in Feb 2018. He was previously fully independent, but this stroke caused severe residual deficits, mainly on the right side as well as speech, and now he is unable to walk or perform most of his ADLs on his own. Entire right side is very weak. He still has a little difficulty with speech but his swallowing is improved to baseline. His mom had to move in with him and now is his main caregiver.     How long can you sit comfortably?  5 minutes    How long can you stand  comfortably?  5 minutes    How long can you walk comfortably?  10 ft    Patient Stated Goals  Walk without walker, walk further with walker, get some strength in back     Currently in Pain?  No/denies          Nustep LVl 4 4 minutes RPM>70 for cardiovascular challenge; HR elevated to 113   SPT with min a for stabilization of walker and cueing for upright posture x 2   Standing in // bars: CGA and occasional Min A to return to COM due to posterior LOB     Ambulate 106 ft with RW and wheelchair follow with Min A for Facilitation provided at [facility] for optimal hip rotation and weight shift. Cueing for spatial awareness and upright position.    Seated:  Sit to stand with stabilization to walker provided 10x, challenging to patient. two near LOB due to fatigue    Crossed arms with upright posture marching 20x each LE with 5lb ankle weights    LAQ 5lb ankle weights, 15x each LE, focus on eccentric control.   BTB abduction 20x with focus on slow muscle activation/recruitment.    BTB hamstring curls 15x each LE  Cross body jab 2 minutes reaching inside/outside BOS with core stability  Cross body jab with duck, focus on upright posture after duck x2 minutes   Adduction ball squeeze 12x 5 second holds     toileting: assistance with set up, able to perform once set up, challenged with negotiating threshold with wheelchair. Propulsion of manual chair with mod I and cueing for sequencing x 20 ft.       Pt educated throughout session about proper posture and technique with exercises. Improved exercise technique, movement at target joints, use of target muscles after min to mod verbal, visual, tactile cues                        PT Short Term Goals - 02/11/19 1404      PT SHORT TERM GOAL #1   Title  Patient will be independent in home exercise program to improve strength/mobility for better functional independence with ADLs.    Baseline  hep compliant    Time  2     Period  Weeks    Status  Achieved      PT SHORT TERM GOAL #2   Title  Patient will require min cueing for STS transfer with CGA for increased independence with mobility.     Baseline  CGA    Time  2    Period  Weeks    Status  Achieved      PT SHORT TERM GOAL #3   Title  Patient will maintain upright posture for > 15 seconds to demonstrate strengthened postural control muscualture     Baseline  18 seconds     Time  2    Period  Weeks    Status  Achieved        PT Long Term Goals - 02/11/19 0001      PT LONG TERM GOAL #1   Title  Patient will perform 10 MWT in < 1 minute to demonstrate improved ambulatory velocity and capacity.     Baseline  02/11/19: 56 seconds with AFO 4/10: 2 minutes 45 seconds; 5/29: 2 minute 31 seconds; 3 minutes 22 seconds 8/22: 3 minutes 2 seconds  9/12:  2 minutes and 43 seconds 10/15: 21mnute 12 seconds 11/7: 1 minute 21 seconds 12/19: 1 min 16 seconds 3/3: 57 seconds; 08/26/18: 1:41.4s= 0.10 m/s 7/1: 1 minute 14 seconds 9/14: 61 seconds    Time  8    Period  Weeks    Status  Achieved      PT LONG TERM GOAL #2   Title  Patient (< 641years old) will complete five times sit to stand test in < 10 seconds indicating an increased LE strength and improved balance.    Baseline  02/11/19: 36 seconds 5/19: 20.6s from [place] one hand on walker and one on WC, 80% upright posture 7/1: 22 seconds one hand on walker one hand on w/c  9/14: 53 seconds with full upright posture    Time  8    Period  Weeks    Status  Partially Met    Target Date  04/08/19      PT LONG TERM GOAL #3   Title  Patient will increase Berg Balance score by > 6 points (26/56) to demonstrate decreased fall risk during functional activities.    Baseline  9/12: 20/56 10/15: 21/56 11/7: 20/56 12/19: 22/56; 08/26/18: 11/56 7/1: 17/56  9/14: 23/56    Time  8  Period  Weeks    Status  Partially Met    Target Date  04/08/19      PT LONG TERM GOAL #4   Title  Patient will increase BLE gross strength to  4+/5 as to improve functional strength for independent gait, increased standing tolerance and increased ADL ability.    Baseline   02/11/19: hip flexion 4/5 hip extension and abduction 2+/54-/5 RLE, 10/15: 4/5 RLE 11/7: 4/5 RLE 3/3: 4/5 7/1:  hip flexion 4/5 hip extension 2/5 hip abduction/adduciton 2/5     Time  8    Period  Weeks    Status  Partially Met    Target Date  04/08/19      PT LONG TERM GOAL #5   Title  Patient will increase lower extremity functional scale to >40/80 to demonstrate improved functional mobility and increased tolerance with ADLs.     Baseline  02/10/19: 34/56 14/80; 12/26: 21/80; 2/18: 28/80  4/10; 25/80 5/29: 27/80; 7/16: 29/80 8/22: 30/80  9/12: 26/80 (feeling more tired due to moving) 10/15: 37/80 11/7: 23/80 3/3: 32/80; 08/25/18: 28/80 9/15: 32/56    Time  8    Period  Weeks    Status  Partially Met    Target Date  04/08/19      PT LONG TERM GOAL #6   Title  Patient will ambulate 150 ft with least assistive device and Supervision with no breaks to allow for increased mobility within home.    Baseline  11/4: 105 ft with AFO after 10 MWT 11/7: 155f 12/19: 115 ft  3/3: will assess next session, has done 200 ft with one rest break; 08/26/18: Pt fatigued after 30' today 9/14: 100 ft with CGA    Time  8    Period  Weeks    Status  Partially Met    Target Date  04/08/19      PT LONG TERM GOAL #7   Title  Patient will perform 10 MWT in <30 seconds for improved gait speed, gait mechanics, and functional capacity for mobility.     Baseline  11/4: 56 seconds with AFO and RW     Time  8    Period  Weeks    Status  New    Target Date  04/08/19            Plan - 02/24/19 1458    Clinical Impression Statement  Patient is challenged by prolonged upright posture in standing with frequent trunk flexion with fatigue resulting in increased UE support and near LOB. HR remained within therapeutic range this session. Patient continues to be highly motivated to increase his  independence with mobility at this time and is eager to get fit for his AFO tomorrow.    Rehab Potential  Fair    Clinical Impairments Affecting Rehab Potential  hx of HTN, HLD, CVA, learning disability, Diabetes, brain tumor,     PT Frequency  2x / week    PT Duration  8 weeks    PT Treatment/Interventions  ADLs/Self Care Home Management;Aquatic Therapy;Ultrasound;Moist Heat;Traction;DME Instruction;Gait training;Stair training;Functional mobility training;Therapeutic activities;Therapeutic exercise;Orthotic Fit/Training;Neuromuscular re-education;Balance training;Patient/family education;Manual techniques;Wheelchair mobility training;Passive range of motion;Energy conservation;Taping;Visual/perceptual remediation/compensation    PT Next Visit Plan  balance    PT Home Exercise Plan  TrA contraction, glute sets: needs updates. Potentially some time in supine flat in hospital bed, to improve hip extension deficits.     Consulted and Agree with Plan of Care  Patient    Family  Member Consulted  mother       Patient will benefit from skilled therapeutic intervention in order to improve the following deficits and impairments:  Abnormal gait, Decreased activity tolerance, Decreased balance, Decreased knowledge of precautions, Decreased endurance, Decreased coordination, Decreased knowledge of use of DME, Decreased mobility, Decreased range of motion, Difficulty walking, Decreased safety awareness, Decreased strength, Impaired flexibility, Impaired perceived functional ability, Impaired tone, Postural dysfunction, Improper body mechanics, Pain  Visit Diagnosis: Muscle weakness (generalized)  Other lack of coordination  Other abnormalities of gait and mobility  Hemiplegia and hemiparesis following cerebral infarction affecting right dominant side [facility])     Problem List Patient Active Problem List   Diagnosis Date Noted  . Acute CVA (cerebrovascular accident) ([place]) 05/02/2016  . Left-sided  weakness 05/01/2016   Janna Arch, PT, DPT   02/24/2019, 3:14 PM  [place] MAIN [facility] SERVICES [address] [place], [place], [address] Phone: [phone]   Fax:  [phone]  Name: Curtis Powell MRN: [id number] Date of Birth: [birth year]

## 2019-03-01 ENCOUNTER — Ambulatory Visit: Payer: Medicare HMO

## 2019-03-01 ENCOUNTER — Encounter: Payer: Self-pay | Admitting: Occupational Therapy

## 2019-03-01 ENCOUNTER — Other Ambulatory Visit: Payer: Self-pay

## 2019-03-01 ENCOUNTER — Ambulatory Visit: Payer: Medicare HMO | Admitting: Occupational Therapy

## 2019-03-01 DIAGNOSIS — R278 Other lack of coordination: Secondary | ICD-10-CM

## 2019-03-01 DIAGNOSIS — M6281 Muscle weakness (generalized): Secondary | ICD-10-CM

## 2019-03-01 DIAGNOSIS — I69351 Hemiplegia and hemiparesis following cerebral infarction affecting right dominant side: Secondary | ICD-10-CM

## 2019-03-01 DIAGNOSIS — R2689 Other abnormalities of gait and mobility: Secondary | ICD-10-CM

## 2019-03-01 NOTE — Therapy (Addendum)
[place] MAIN [facility] SERVICES [address] [place], [place], [address] Phone: [phone]   Fax:  [phone]  Occupational Therapy Treatment/10th visit progress Report Reporting period from 12/09/2018 to 03/01/2019  Patient Details  Name: Curtis Powell MRN: [id number] Date of Birth: [birth year] No data recorded  Encounter Date: 03/01/2019  OT End of Session - 03/01/19 1530    Visit Number  150    Number of Visits  161    Date for OT Re-Evaluation  05/05/19    Authorization Type  Progress reporting period starting 12/09/2018    OT Start Time  1516    OT Stop Time  1600    OT Time Calculation (min)  44 min    Activity Tolerance  Patient tolerated treatment well    Behavior During Therapy  [facility] for tasks assessed/performed       Past Medical History:  Diagnosis Date  . GERD (gastroesophageal reflux disease)   . Hyperlipidemia   . Hypertension   . Obesity   . Stroke [facility])     Past Surgical History:  Procedure Laterality Date  . BRAIN SURGERY      There were no vitals filed for this visit.  Subjective Assessment - 03/01/19 1527    Subjective   Patient reports his left hand had been hurting some today in the hand across AI pulley of left middle finger.    Pertinent History  Pt. is a 53 y.o. male who suffered a CVA on 05/01/2016. Pt. was admitted to the hospital. Once discharged, he received Home Health PT and OT services for about a month. Pt. has had multiple CVAs over the past 8 years, and has had multiple falls in the past 6 months. Pt. resides in an apartment. Pt. has caregivers for 80 hours. Pt.'s mother stays with pt. at night, and assists with IADL tasks.      Patient Stated Goals  To be able to throw a ball and dribble a ball, do as much as I can for myself.     Currently in Pain?  Yes    Pain Score  5     Pain Location  Hand    Pain Orientation  Left    Pain Descriptors / Indicators  Aching    Pain Type  Acute pain    Pain  Onset  Today    Pain Frequency  Intermittent    Multiple Pain Sites  No      Reassessment of grip, pinch and coordination, see flowsheet for details.    Self Care: Patient seen for toileting this date, able to roll self towards bathroom with increased time allowed.  Requires assist to get wheelchair over the threshold and position self for toilet transfer.  Patient using grab bar for safety with sit to stand and supervision from therapist.  Patient was able to negotiate clothing down for urinating in standing.   Handwriting:  Patient seen for handwriting this date, wanted to try large grip and medium pen for printing and signing name on wide lined paper.    TherexHulan Fess OT Assessment - 03/01/19 1542      Coordination   Right 9 Hole Peg Test  52    Left 9 Hole Peg Test  46      Hand Function   Right Hand Grip (lbs)  44    Right Hand Lateral Pinch  22 lbs    Right Hand 3 Point Pinch  14 lbs  Left Hand Grip (lbs)  35    Left Hand Lateral Pinch  17 lbs    Left 3 point pinch  12 lbs     Patient seen for reaching tasks with use of 4 level Shape tower, patient able to perform the first 3 levels with occasional min assist for reaching top of 3rd level to place and remove shape pieces.  Unable to perform the tallest 4th level.    Response to tx:  Patient demonstrates improvement with clothing negotiation with toileting, patient required increased time to complete tasks, occasional verbal cues but therapist did not have to help pull up pants or reshift clothing afterwards.  Patient did require some assist with going over the threshold of the bathroom.  Handwriting fair, tried 2 types of pens, one large grip pen and another med grip.  Patient performed best with medium grip pen. Patient demonstrated slight improvements with grip today, coordination stayed the same as when measured last. Patient continues to make good progress towards goals and continues to benefit from skilled OT services to  increase independence in daily tasks.                    OT Education - 03/01/19 1529    Education provided  Yes    Education Details  UE reaching, Fine motor coordination skills    Person(s) Educated  Patient    Methods  Explanation;Demonstration    Comprehension  Verbalized understanding;Returned demonstration;Verbal cues required    Person(s) Educated  Patient    Methods  Explanation;Demonstration    Comprehension  Verbalized understanding;Returned demonstration       OT Short Term Goals - 03/03/17 1459      OT SHORT TERM GOAL #1   Title  `        OT Long Term Goals - 03/01/19 1532      OT LONG TERM GOAL #2   Title  Pt. will complete self grooming with minA      OT LONG TERM GOAL #4   Title  Pt. will perform self dressing with minA and A/E as needed.    Baseline  requires assist with socks at times, diff with reaching to get pants over feet.  Able to get jacket on now and shirt with modified independence.    Time  12    Period  Weeks    Status  Partially Met    Target Date  05/05/19      OT LONG TERM GOAL #6   Title  Pt. will write his name efficiently with 100% legibility    Baseline  Pt continues to present with limited legibility and efficiency when writing name to sign in when riding the [place]    Time  12    Period  Weeks    Status  On-going    Target Date  05/05/19      OT LONG TERM GOAL #7   Title  Pt will independently and consistently follow HEP to increase UE strength to increase functional independence    Baseline  Pt to continues to work towards independence with exercises    Time  12    Period  Weeks    Status  On-going    Target Date  05/05/19      OT LONG TERM GOAL #8   Title  Pt. will require supervision ironing a shirt.    Baseline  Pt. requires CGA-minA, and complete set-up seated using a tabletop iron  Time  12    Period  Weeks    Status  On-going    Target Date  05/05/19      OT LONG TERM GOAL  #9   Baseline  Pt. will  independently be able to sew a button onto a shirt.    Time  12    Period  Weeks    Status  On-going    Target Date  05/05/19      OT LONG TERM GOAL  #10   TITLE  Pt. will independently be able to throw a ball    Baseline  Pt. continues to be limited    Time  12    Period  Weeks    Status  On-going    Target Date  05/05/19      OT LONG TERM GOAL  #11   TITLE  Pt. will improve UE functional reaching to be able to independently use his RUE to hand his clothes in the closest.    Baseline  Pt. continues to have difficulty.    Time  12    Period  Weeks    Status  On-going    Target Date  05/05/19            Plan - 03/01/19 1530    Clinical Impression Statement  Patient demonstrates improvement with clothing negotiation with toileting, patient required increased time to complete tasks, occasional verbal cues but therapist did not have to help pull up pants or reshift clothing afterwards.  Patient did require some assist with going over the threshold of the bathroom.  Handwriting fair, tried 2 types of pens, one large grip pen and another med grip.  Patient performed best with medium grip pen. Patient demonstrated slight improvements with grip today, coordination stayed the same as when measured last. Patient continues to make good progress towards goals and continues to benefit from skilled OT services to increase independence in daily tasks.    OT Occupational Profile and History  Problem Focused Assessment - Including review of records relating to presenting problem    Occupational performance deficits (Please refer to evaluation for details):  ADL's;IADL's    Body Structure / Function / Physical Skills  ADL;FMC;ROM;IADL;Pain;Coordination    Rehab Potential  Fair    Clinical Decision Making  Several treatment options, min-mod task modification necessary    OT Frequency  2x / week    OT Duration  12 weeks    OT Treatment/Interventions  Self-care/ADL training;Patient/family  education;DME and/or AE instruction;Therapeutic exercise;Moist Heat;Neuromuscular education    Consulted and Agree with Plan of Care  Patient       Patient will benefit from skilled therapeutic intervention in order to improve the following deficits and impairments:   Body Structure / Function / Physical Skills: ADL, FMC, ROM, IADL, Pain, Coordination       Visit Diagnosis: Muscle weakness (generalized)  Other lack of coordination  Hemiplegia and hemiparesis following cerebral infarction affecting right dominant side [facility])    Problem List Patient Active Problem List   Diagnosis Date Noted  . Acute CVA (cerebrovascular accident) ([place]) 05/02/2016  . Left-sided weakness 05/01/2016   Achilles Dunk, OTR/L, CLT  Nixie Laube 03/02/2019, 12:47 PM  [place] MAIN [facility] SERVICES [address] [place], [place], [address] Phone: [phone]   Fax:  [phone]  Name: TSUNEO FAISON MRN: [id number] Date of Birth: 07-01-1965

## 2019-03-01 NOTE — Therapy (Signed)
[place] MAIN [facility] SERVICES [address] [place], [place], [address] Phone: [phone]   Fax:  [phone]  Physical Therapy Treatment  Patient Details  Name: Curtis Powell MRN: [id number] Date of Birth: Mar 31, 1966 No data recorded  Encounter Date: 03/01/2019  PT End of Session - 03/01/19 1503    Visit Number  [id number]    Number of Visits  166    Date for PT Re-Evaluation  04/08/19    Authorization Type  5    Authorization Time Period  authorized 11/16/18-05/19/19    PT Start Time  1430    PT Stop Time  1514    PT Time Calculation (min)  44 min    Equipment Utilized During Treatment  Gait belt    Activity Tolerance  Patient tolerated treatment well;Patient limited by fatigue    Behavior During Therapy  WFL for tasks assessed/performed       Past Medical History:  Diagnosis Date  . GERD (gastroesophageal reflux disease)   . Hyperlipidemia   . Hypertension   . Obesity   . Stroke [facility])     Past Surgical History:  Procedure Laterality Date  . BRAIN SURGERY      There were no vitals filed for this visit.  Subjective Assessment - 03/01/19 1438    Subjective  Patient reports he went to the orthotist and will get his AFO on the 10th. Reports compliance with HEP. Patient reports L hand pain that started this morning.    Pertinent History   Patient is a pleasant 53 year old male who presents to physical therapy for weakness and immobility secondary to CVA.  Had a stroke in Feb 2018. He was previously fully independent, but this stroke caused severe residual deficits, mainly on the right side as well as speech, and now he is unable to walk or perform most of his ADLs on his own. Entire right side is very weak. He still has a little difficulty with speech but his swallowing is improved to baseline. His mom had to move in with him and now is his main caregiver.     How long can you sit comfortably?  5 minutes    How long can you stand  comfortably?  5 minutes    How long can you walk comfortably?  10 ft    Patient Stated Goals  Walk without walker, walk further with walker, get some strength in back     Currently in Pain?  Yes    Pain Score  5     Pain Location  Hand    Pain Orientation  Left    Pain Descriptors / Indicators  Aching;Tender    Pain Type  Acute pain            Nustep LVl 4 4 minutes RPM>70 for cardiovascular challenge; HR elevated to 113   SPT with min a for stabilization of walker and cueing for upright posture x 2   Standing in // bars: CGA and occasional Min A to return to COM due to posterior LOB     Ambulate 110 ft with RW and wheelchair follow with Min A for Facilitation provided at [facility] for optimal hip rotation and weight shift. Cueing for spatial awareness and upright position. Fatigue results in excessive tremors of BLE   Seated:  Sit to stand with stabilization to walker provided 10x, challenging to patient. two near LOB due to fatigue    Crossed arms with upright posture  marching 20x each LE with 5lb ankle weights    LAQ 5lb ankle weights, 20x each LE, focus on eccentric control.    BTB abduction 20x with focus on slow muscle activation/recruitment.    BTB hamstring curls 15x each LE    BTB marching with upright posture 15x each LE    Adduction ball squeeze 12x 5 second holds      Pt educated throughout session about proper posture and technique with exercises. Improved exercise technique, movement at target joints, use of target muscles after min to mod verbal, visual, tactile cues                     PT Education - 03/01/19 1502    Education provided  Yes    Education Details  exercise technique, body mechanics    Person(s) Educated  Patient    Methods  Explanation;Demonstration;Tactile cues;Verbal cues    Comprehension  Verbalized understanding;Returned demonstration;Verbal cues required;Tactile cues required       PT Short Term Goals - 02/11/19 1404       PT SHORT TERM GOAL #1   Title  Patient will be independent in home exercise program to improve strength/mobility for better functional independence with ADLs.    Baseline  hep compliant    Time  2    Period  Weeks    Status  Achieved      PT SHORT TERM GOAL #2   Title  Patient will require min cueing for STS transfer with CGA for increased independence with mobility.     Baseline  CGA    Time  2    Period  Weeks    Status  Achieved      PT SHORT TERM GOAL #3   Title  Patient will maintain upright posture for > 15 seconds to demonstrate strengthened postural control muscualture     Baseline  18 seconds     Time  2    Period  Weeks    Status  Achieved        PT Long Term Goals - 02/11/19 0001      PT LONG TERM GOAL #1   Title  Patient will perform 10 MWT in < 1 minute to demonstrate improved ambulatory velocity and capacity.     Baseline  02/11/19: 56 seconds with AFO 4/10: 2 minutes 45 seconds; 5/29: 2 minute 31 seconds; 3 minutes 22 seconds 8/22: 3 minutes 2 seconds  9/12:  2 minutes and 43 seconds 10/15: 39mnute 12 seconds 11/7: 1 minute 21 seconds 12/19: 1 min 16 seconds 3/3: 57 seconds; 08/26/18: 1:41.4s= 0.10 m/s 7/1: 1 minute 14 seconds 9/14: 61 seconds    Time  8    Period  Weeks    Status  Achieved      PT LONG TERM GOAL #2   Title  Patient (< 631years old) will complete five times sit to stand test in < 10 seconds indicating an increased LE strength and improved balance.    Baseline  02/11/19: 36 seconds 5/19: 20.[place]s [place] one hand on walker and one on WC, 80% upright posture 7/1: 22 seconds one hand on walker one hand on w/c  9/14: 53 seconds with full upright posture    Time  8    Period  Weeks    Status  Partially Met    Target Date  04/08/19      PT LONG TERM GOAL #3   Title  Patient will  increase Berg Balance score by > 6 points (26/56) to demonstrate decreased fall risk during functional activities.    Baseline  9/12: 20/56 10/15: 21/56 11/7: 20/56  12/19: 22/56; 08/26/18: 11/56 7/1: 17/56  9/14: 23/56    Time  8    Period  Weeks    Status  Partially Met    Target Date  04/08/19      PT LONG TERM GOAL #4   Title  Patient will increase BLE gross strength to 4+/5 as to improve functional strength for independent gait, increased standing tolerance and increased ADL ability.    Baseline   02/11/19: hip flexion 4/5 hip extension and abduction 2+/54-/5 RLE, 10/15: 4/5 RLE 11/7: 4/5 RLE 3/3: 4/5 7/1:  hip flexion 4/5 hip extension 2/5 hip abduction/adduciton 2/5     Time  8    Period  Weeks    Status  Partially Met    Target Date  04/08/19      PT LONG TERM GOAL #5   Title  Patient will increase lower extremity functional scale to >40/80 to demonstrate improved functional mobility and increased tolerance with ADLs.     Baseline  02/10/19: 34/56 14/80; 12/26: 21/80; 2/18: 28/80  4/10; 25/80 5/29: 27/80; 7/16: 29/80 8/22: 30/80  9/12: 26/80 (feeling more tired due to moving) 10/15: 37/80 11/7: 23/80 3/3: 32/80; 08/25/18: 28/80 9/15: 32/56    Time  8    Period  Weeks    Status  Partially Met    Target Date  04/08/19      PT LONG TERM GOAL #6   Title  Patient will ambulate 150 ft with least assistive device and Supervision with no breaks to allow for increased mobility within home.    Baseline  11/4: 105 ft with AFO after 10 MWT 11/7: 113f 12/19: 115 ft  3/3: will assess next session, has done 200 ft with one rest break; 08/26/18: Pt fatigued after 30' today 9/14: 100 ft with CGA    Time  8    Period  Weeks    Status  Partially Met    Target Date  04/08/19      PT LONG TERM GOAL #7   Title  Patient will perform 10 MWT in <30 seconds for improved gait speed, gait mechanics, and functional capacity for mobility.     Baseline  11/4: 56 seconds with AFO and RW     Time  8    Period  Weeks    Status  New    Target Date  04/08/19            Plan - 03/01/19 1510    Clinical Impression Statement  Patient presented with excellent  motivation to today's physical therapy session despite fatigue. Patient continues to be challenged with maintaining upright posture with fatigue. Patient's LE had increased trembling with prolonged ambulation requiring him to rest prior to resuming exercise. Pt will continue to benefit from skilled PT services to address strength, balance and mobility deficits in order to improve function at home.    Rehab Potential  Fair    Clinical Impairments Affecting Rehab Potential  hx of HTN, HLD, CVA, learning disability, Diabetes, brain tumor,     PT Frequency  2x / week    PT Duration  8 weeks    PT Treatment/Interventions  ADLs/Self Care Home Management;Aquatic Therapy;Ultrasound;Moist Heat;Traction;DME Instruction;Gait training;Stair training;Functional mobility training;Therapeutic activities;Therapeutic exercise;Orthotic Fit/Training;Neuromuscular re-education;Balance training;Patient/family education;Manual techniques;Wheelchair mobility training;Passive range of motion;Energy conservation;Taping;Visual/perceptual remediation/compensation  PT Next Visit Plan  balance    PT Home Exercise Plan  TrA contraction, glute sets: needs updates. Potentially some time in supine flat in hospital bed, to improve hip extension deficits.     Consulted and Agree with Plan of Care  Patient    Family Member Consulted  mother       Patient will benefit from skilled therapeutic intervention in order to improve the following deficits and impairments:  Abnormal gait, Decreased activity tolerance, Decreased balance, Decreased knowledge of precautions, Decreased endurance, Decreased coordination, Decreased knowledge of use of DME, Decreased mobility, Decreased range of motion, Difficulty walking, Decreased safety awareness, Decreased strength, Impaired flexibility, Impaired perceived functional ability, Impaired tone, Postural dysfunction, Improper body mechanics, Pain  Visit Diagnosis: Muscle weakness  (generalized)  Other lack of coordination  Other abnormalities of gait and mobility  Hemiplegia and hemiparesis following cerebral infarction affecting right dominant side [facility])     Problem List Patient Active Problem List   Diagnosis Date Noted  . Acute CVA (cerebrovascular accident) ([place]) 05/02/2016  . Left-sided weakness 05/01/2016   Janna Arch, PT, DPT   03/01/2019, 4:02 PM  [place] MAIN [facility] SERVICES [address] [place], [place], [address] Phone: [phone]   Fax:  [phone]  Name: Curtis Powell MRN: [id number] Date of Birth: 12-12-1965

## 2019-03-03 ENCOUNTER — Ambulatory Visit: Payer: Medicare HMO | Admitting: Occupational Therapy

## 2019-03-03 ENCOUNTER — Ambulatory Visit: Payer: Medicare HMO

## 2019-03-03 ENCOUNTER — Encounter: Payer: Self-pay | Admitting: Occupational Therapy

## 2019-03-03 ENCOUNTER — Other Ambulatory Visit: Payer: Self-pay

## 2019-03-03 DIAGNOSIS — M6281 Muscle weakness (generalized): Secondary | ICD-10-CM | POA: Diagnosis not present

## 2019-03-03 DIAGNOSIS — R2689 Other abnormalities of gait and mobility: Secondary | ICD-10-CM

## 2019-03-03 DIAGNOSIS — I69351 Hemiplegia and hemiparesis following cerebral infarction affecting right dominant side: Secondary | ICD-10-CM

## 2019-03-03 DIAGNOSIS — R278 Other lack of coordination: Secondary | ICD-10-CM

## 2019-03-03 NOTE — Therapy (Signed)
[place] MAIN [facility] SERVICES [address] [place], [place], [address] Phone: [phone]   Fax:  [phone]  Occupational Therapy Treatment  Patient Details  Name: Curtis Powell MRN: [id number] Date of Birth: 10-08-1965 No data recorded  Encounter Date: 03/03/2019  OT End of Session - 03/03/19 1527    Visit Number  151    Number of Visits  161    Date for OT Re-Evaluation  05/05/19    OT Start Time  1515    OT Stop Time  1600    OT Time Calculation (min)  45 min    Activity Tolerance  Patient tolerated treatment well    Behavior During Therapy  [facility] for tasks assessed/performed       Past Medical History:  Diagnosis Date  . GERD (gastroesophageal reflux disease)   . Hyperlipidemia   . Hypertension   . Obesity   . Stroke [facility])     Past Surgical History:  Procedure Laterality Date  . BRAIN SURGERY      There were no vitals filed for this visit.  Subjective Assessment - 03/03/19 1524    Subjective   Patient reports left hand still hurting, 5/10 pain this date along A1 pulley middle finger.    Pertinent History  Pt. is a 53 y.o. male who suffered a CVA on 05/01/2016. Pt. was admitted to the hospital. Once discharged, he received Home Health PT and OT services for about a month. Pt. has had multiple CVAs over the past 8 years, and has had multiple falls in the past 6 months. Pt. resides in an apartment. Pt. has caregivers for 80 hours. Pt.'s mother stays with pt. at night, and assists with IADL tasks.      Patient Stated Goals  To be able to throw a ball and dribble a ball, do as much as I can for myself.     Currently in Pain?  Yes    Pain Score  5     Pain Location  Hand    Pain Orientation  Left    Pain Descriptors / Indicators  Aching    Pain Type  Acute pain    Pain Onset  1 to 4 weeks ago    Multiple Pain Sites  No        Therapeutic Exercise:   Patient seen for reaching tasks to place and remove small and medium  washers from magnetic bowl to place onto hooks in elevated surface to encourage overhead reach.  Adjustments needed to be made throughout the session, bringing the hooks down to accommodate for arm fatigue.  Rest breaks as needed.  Patient asking to perform shape tower again, feels this also helps him with reach and strengthening, able to complete 3 out of 4  levels this date from seated position.   ADL:   Patient states, "I think I need to practice with putting my coat on, I'm not going to lie!" Toileting skills:  Patient requires assist to assess bathroom via wheelchair, supervision for safety and use of grab bars for transfer/balance.  UB dressing with jacket: Min assist and cues with jacket donning this date.    Response to tx: Patient requires cues not to lean while performing reaching tasks in sitting with overhead reach.  Patient progressing with reaching tasks, activity tolerance and self care tasks I.e. toileting.  Continues to require min assist with donning jacket.  Assist to get wheelchair over threshold to the bathroom in the  clinic.  Continue to work towards goals in plan of care to further improve strength, ROM, coordination and independence in daily tasks.                    OT Education - 03/03/19 1527    Education provided  Yes    Education Details  reaching, weight shift, ice massage for left hand    Person(s) Educated  Patient    Methods  Explanation;Demonstration    Comprehension  Verbalized understanding;Returned demonstration;Verbal cues required       OT Short Term Goals - 03/03/17 1459      OT SHORT TERM GOAL #1   Title  `        OT Long Term Goals - 03/01/19 1532      OT LONG TERM GOAL #2   Title  Pt. will complete self grooming with minA      OT LONG TERM GOAL #4   Title  Pt. will perform self dressing with minA and A/E as needed.    Baseline  requires assist with socks at times, diff with reaching to get pants over feet.  Able to get  jacket on now and shirt with modified independence.    Time  12    Period  Weeks    Status  Partially Met    Target Date  05/05/19      OT LONG TERM GOAL #6   Title  Pt. will write his name efficiently with 100% legibility    Baseline  Pt continues to present with limited legibility and efficiency when writing name to sign in when riding the [place]    Time  12    Period  Weeks    Status  On-going    Target Date  05/05/19      OT LONG TERM GOAL #7   Title  Pt will independently and consistently follow HEP to increase UE strength to increase functional independence    Baseline  Pt to continues to work towards independence with exercises    Time  12    Period  Weeks    Status  On-going    Target Date  05/05/19      OT LONG TERM GOAL #8   Title  Pt. will require supervision ironing a shirt.    Baseline  Pt. requires CGA-minA, and complete set-up seated using a tabletop iron    Time  12    Period  Weeks    Status  On-going    Target Date  05/05/19      OT LONG TERM GOAL  #9   Baseline  Pt. will independently be able to sew a button onto a shirt.    Time  12    Period  Weeks    Status  On-going    Target Date  05/05/19      OT LONG TERM GOAL  #10   TITLE  Pt. will independently be able to throw a ball    Baseline  Pt. continues to be limited    Time  12    Period  Weeks    Status  On-going    Target Date  05/05/19      OT LONG TERM GOAL  #11   TITLE  Pt. will improve UE functional reaching to be able to independently use his RUE to hand his clothes in the closest.    Baseline  Pt. continues to have difficulty.    Time  12    Period  Weeks    Status  On-going    Target Date  05/05/19            Plan - 03/03/19 1528    Clinical Impression Statement  Patient requires cues not to lean while performing reaching tasks in sitting with overhead reach.  Patient progressing with reaching tasks, activity tolerance and self care tasks I.e. toileting.  Continues to require min  assist with donning jacket.  Assist to get wheelchair over threshold to the bathroom in the clinic.  Continue to work towards goals in plan of care to further improve strength, ROM, coordination and independence in daily tasks.    OT Occupational Profile and History  Problem Focused Assessment - Including review of records relating to presenting problem    Occupational performance deficits (Please refer to evaluation for details):  ADL's;IADL's    Body Structure / Function / Physical Skills  ADL;FMC;ROM;IADL;Pain;Coordination    Rehab Potential  Fair    Clinical Decision Making  Several treatment options, min-mod task modification necessary    OT Frequency  2x / week    OT Duration  12 weeks    OT Treatment/Interventions  Self-care/ADL training;Patient/family education;DME and/or AE instruction;Therapeutic exercise;Moist Heat;Neuromuscular education    Consulted and Agree with Plan of Care  Patient       Patient will benefit from skilled therapeutic intervention in order to improve the following deficits and impairments:   Body Structure / Function / Physical Skills: ADL, FMC, ROM, IADL, Pain, Coordination       Visit Diagnosis: Hemiplegia and hemiparesis following cerebral infarction affecting right dominant side ([facility])  Muscle weakness (generalized)  Other lack of coordination    Problem List Patient Active Problem List   Diagnosis Date Noted  . Acute CVA (cerebrovascular accident) ([place]) 05/02/2016  . Left-sided weakness 05/01/2016   Achilles Dunk, OTR/L, CLT  Elbia Paro 03/04/2019, 5:53 PM  [place] MAIN [facility] SERVICES [address] [place], [place], [address] Phone: [phone]   Fax:  [phone]  Name: Curtis Powell MRN: [id number] Date of Birth: May 07, 1965

## 2019-03-03 NOTE — Therapy (Signed)
[place] MAIN [facility] SERVICES [address] [place], [place], [address] Phone: [phone]   Fax:  [phone]  Physical Therapy Treatment  Patient Details  Name: Curtis Powell MRN: [id number] Date of Birth: September 29, 1965 No data recorded  Encounter Date: 03/03/2019  PT End of Session - 03/03/19 1440    Visit Number  [id number]    Number of Visits  166    Date for PT Re-Evaluation  04/08/19    Authorization Type  6    Authorization Time Period  authorized 11/16/18-05/19/19    PT Start Time  1430    PT Stop Time  1515    PT Time Calculation (min)  45 min    Equipment Utilized During Treatment  Gait belt    Activity Tolerance  Patient tolerated treatment well;Patient limited by fatigue    Behavior During Therapy  WFL for tasks assessed/performed       Past Medical History:  Diagnosis Date  . GERD (gastroesophageal reflux disease)   . Hyperlipidemia   . Hypertension   . Obesity   . Stroke [facility])     Past Surgical History:  Procedure Laterality Date  . BRAIN SURGERY      There were no vitals filed for this visit.  Subjective Assessment - 03/03/19 1438    Subjective  Patient reports he is doing well today. He is complaining of some L hand pain today that started approximately 1 month ago. He rates pain as a 5/10 currently. Reports compliance with HEP. No specific questions upon arrival.    Pertinent History   Patient is a pleasant 53 year old male who presents to physical therapy for weakness and immobility secondary to CVA.  Had a stroke in Feb 2018. He was previously fully independent, but this stroke caused severe residual deficits, mainly on the right side as well as speech, and now he is unable to walk or perform most of his ADLs on his own. Entire right side is very weak. He still has a little difficulty with speech but his swallowing is improved to baseline. His mom had to move in with him and now is his main caregiver.     How long can you  sit comfortably?  5 minutes    How long can you stand comfortably?  5 minutes    How long can you walk comfortably?  10 ft    Patient Stated Goals  Walk without walker, walk further with walker, get some strength in back     Currently in Pain?  Yes    Pain Score  5     Pain Location  Hand    Pain Orientation  Left    Pain Descriptors / Indicators  Nagging    Pain Type  Acute pain    Pain Onset  1 to 4 weeks ago            TREATMENT   Ther-ex  NuStep L2-3 x 5 minutes RPM >70 for cardiovascular challenge, therapist monitoring fatigue and challenge; Sit to stand from blue mat table at lowest setting x 10, pt with heavy reliance on back of knee supported on mat table despitre repeated cues by therapist to shift weight forward;  Seated: Marching with upright posture and manual resistance from therapist x 15;  LAQ 5lb ankle weights, 20x each LE, focus on eccentric control.  Clams with manual resistance x 15, focus on slow muscle activation/recruitment. Adduction ball squeeze 12x 5 second holds Hamstring curls  with manual resistance x 15 bilateral;  Ambulate30 ft from NuStep to mat table and then an additional 165f later in the session in gym/hallway. Both times performed with RW and wheelchair follow with Min A forFacilitation provided at PSIS for optimal hip rotation and weight shift. Cueing for spatial awareness and upright position.   Pt educated throughout session about proper posture and technique with exercises. Improved exercise technique, movement at target joints, use of target muscles after min to mod verbal, visual, tactile cues   Patient presented with excellent motivation to today's physical therapy session. He continues to be challenged with maintaining upright posture with fatigue. He also demonstrates heavy reliance with back of knees supported on mat table during sit to stand exercise despite repeated cues by therapist to shift weight forward. Pt  encouraged to continue his HEP. He will continue to benefit from skilled PT services to address strength, balance and mobility deficits in order to improve function at home.                      PT Short Term Goals - 02/11/19 1404      PT SHORT TERM GOAL #1   Title  Patient will be independent in home exercise program to improve strength/mobility for better functional independence with ADLs.    Baseline  hep compliant    Time  2    Period  Weeks    Status  Achieved      PT SHORT TERM GOAL #2   Title  Patient will require min cueing for STS transfer with CGA for increased independence with mobility.     Baseline  CGA    Time  2    Period  Weeks    Status  Achieved      PT SHORT TERM GOAL #3   Title  Patient will maintain upright posture for > 15 seconds to demonstrate strengthened postural control muscualture     Baseline  18 seconds     Time  2    Period  Weeks    Status  Achieved        PT Long Term Goals - 02/11/19 0001      PT LONG TERM GOAL #1   Title  Patient will perform 10 MWT in < 1 minute to demonstrate improved ambulatory velocity and capacity.     Baseline  02/11/19: 56 seconds with AFO 4/10: 2 minutes 45 seconds; 5/29: 2 minute 31 seconds; 3 minutes 22 seconds 8/22: 3 minutes 2 seconds  9/12:  2 minutes and 43 seconds 10/15: 267mute 12 seconds 11/7: 1 minute 21 seconds 12/19: 1 min 16 seconds 3/3: 57 seconds; 08/26/18: 1:41.4s= 0.10 m/s 7/1: 1 minute 14 seconds 9/14: 61 seconds    Time  8    Period  Weeks    Status  Achieved      PT LONG TERM GOAL #2   Title  Patient (< [age]ears old) will complete five times sit to stand test in < 10 seconds indicating an increased LE strength and improved balance.    Baseline  02/11/19: 36 seconds 5/19: 20.6s from [place] on walker and one on WC, 80% upright posture 7/1: 22 seconds one hand on walker one hand on w/c  9/14: 53 seconds with full upright posture    Time  8    Period  Weeks    Status  Partially  Met    Target Date  04/08/19  PT LONG TERM GOAL #3   Title  Patient will increase Berg Balance score by > 6 points (26/56) to demonstrate decreased fall risk during functional activities.    Baseline  9/12: 20/56 10/15: 21/56 11/7: 20/56 12/19: 22/56; 08/26/18: 11/56 7/1: 17/56  9/14: 23/56    Time  8    Period  Weeks    Status  Partially Met    Target Date  04/08/19      PT LONG TERM GOAL #4   Title  Patient will increase BLE gross strength to 4+/5 as to improve functional strength for independent gait, increased standing tolerance and increased ADL ability.    Baseline   02/11/19: hip flexion 4/5 hip extension and abduction 2+/54-/5 RLE, 10/15: 4/5 RLE 11/7: 4/5 RLE 3/3: 4/5 7/1:  hip flexion 4/5 hip extension 2/5 hip abduction/adduciton 2/5     Time  8    Period  Weeks    Status  Partially Met    Target Date  04/08/19      PT LONG TERM GOAL #5   Title  Patient will increase lower extremity functional scale to >40/80 to demonstrate improved functional mobility and increased tolerance with ADLs.     Baseline  02/10/19: 34/56 14/80; 12/26: 21/80; 2/18: 28/80  4/10; 25/80 5/29: 27/80; 7/16: 29/80 8/22: 30/80  9/12: 26/80 (feeling more tired due to moving) 10/15: 37/80 11/7: 23/80 3/3: 32/80; 08/25/18: 28/80 9/15: 32/56    Time  8    Period  Weeks    Status  Partially Met    Target Date  04/08/19      PT LONG TERM GOAL #6   Title  Patient will ambulate 150 ft with least assistive device and Supervision with no breaks to allow for increased mobility within home.    Baseline  11/4: 105 ft with AFO after 10 MWT 11/7: 142f 12/19: 115 ft  3/3: will assess next session, has done 200 ft with one rest break; 08/26/18: Pt fatigued after 30' today 9/14: 100 ft with CGA    Time  8    Period  Weeks    Status  Partially Met    Target Date  04/08/19      PT LONG TERM GOAL #7   Title  Patient will perform 10 MWT in <30 seconds for improved gait speed, gait mechanics, and functional capacity for  mobility.     Baseline  11/4: 56 seconds with AFO and RW     Time  8    Period  Weeks    Status  New    Target Date  04/08/19            Plan - 03/03/19 1441    Clinical Impression Statement  Patient presented with excellent motivation to today's physical therapy session. He continues to be challenged with maintaining upright posture with fatigue. He also demonstrates heavy reliance with back of knees supported on mat table during sit to stand exercise despite repeated cues by therapist to shift weight forward. Pt encouraged to continue his HEP. He will continue to benefit from skilled PT services to address strength, balance and mobility deficits in order to improve function at home.    Rehab Potential  Fair    Clinical Impairments Affecting Rehab Potential  hx of HTN, HLD, CVA, learning disability, Diabetes, brain tumor,     PT Frequency  2x / week    PT Duration  8 weeks    PT Treatment/Interventions  ADLs/Self  Care Home Management;Aquatic Therapy;Ultrasound;Moist Heat;Traction;DME Instruction;Gait training;Stair training;Functional mobility training;Therapeutic activities;Therapeutic exercise;Orthotic Fit/Training;Neuromuscular re-education;Balance training;Patient/family education;Manual techniques;Wheelchair mobility training;Passive range of motion;Energy conservation;Taping;Visual/perceptual remediation/compensation    PT Next Visit Plan  balance    PT Home Exercise Plan  TrA contraction, glute sets: needs updates. Potentially some time in supine flat in hospital bed, to improve hip extension deficits.     Consulted and Agree with Plan of Care  Patient    Family Member Consulted  mother       Patient will benefit from skilled therapeutic intervention in order to improve the following deficits and impairments:  Abnormal gait, Decreased activity tolerance, Decreased balance, Decreased knowledge of precautions, Decreased endurance, Decreased coordination, Decreased knowledge of use  of DME, Decreased mobility, Decreased range of motion, Difficulty walking, Decreased safety awareness, Decreased strength, Impaired flexibility, Impaired perceived functional ability, Impaired tone, Postural dysfunction, Improper body mechanics, Pain  Visit Diagnosis: Hemiplegia and hemiparesis following cerebral infarction affecting right dominant side (HCC)  Other abnormalities of gait and mobility  Muscle weakness (generalized)     Problem List Patient Active Problem List   Diagnosis Date Noted  . Acute CVA (cerebrovascular accident) ([place]) 05/02/2016  . Left-sided weakness 05/01/2016   Phillips Grout PT, DPT, GCS  Lovene Maret 03/03/2019, 4:23 PM  [place] MAIN [facility] SERVICES [address] [place], [place], [address] Phone: [phone]   Fax:  [phone]  Name: ADEN SEK MRN: [id number] Date of Birth: [birth year]

## 2019-03-08 ENCOUNTER — Other Ambulatory Visit: Payer: Self-pay

## 2019-03-08 ENCOUNTER — Ambulatory Visit: Payer: Medicare HMO

## 2019-03-08 ENCOUNTER — Encounter: Payer: Self-pay | Admitting: Occupational Therapy

## 2019-03-08 ENCOUNTER — Ambulatory Visit: Payer: Medicare HMO | Admitting: Occupational Therapy

## 2019-03-08 DIAGNOSIS — M6281 Muscle weakness (generalized): Secondary | ICD-10-CM | POA: Diagnosis not present

## 2019-03-08 DIAGNOSIS — R2689 Other abnormalities of gait and mobility: Secondary | ICD-10-CM

## 2019-03-08 DIAGNOSIS — I69351 Hemiplegia and hemiparesis following cerebral infarction affecting right dominant side: Secondary | ICD-10-CM

## 2019-03-08 DIAGNOSIS — R278 Other lack of coordination: Secondary | ICD-10-CM

## 2019-03-08 NOTE — Therapy (Signed)
[place] MAIN [facility] SERVICES [address] [place], [place], [address] Phone: [phone]   Fax:  [phone]  Physical Therapy Treatment  Patient Details  Name: Curtis Powell MRN: [id number] Date of Birth: 06-07-1965 No data recorded  Encounter Date: 03/08/2019  PT End of Session - 03/08/19 1352    Visit Number  157    Number of Visits  166    Date for PT Re-Evaluation  04/08/19    Authorization Type  7    Authorization Time Period  authorized 11/16/18-05/19/19    PT Start Time  4008    PT Stop Time  1429    PT Time Calculation (min)  44 min    Equipment Utilized During Treatment  Gait belt    Activity Tolerance  Patient tolerated treatment well;Patient limited by fatigue    Behavior During Therapy  WFL for tasks assessed/performed       Past Medical History:  Diagnosis Date  . GERD (gastroesophageal reflux disease)   . Hyperlipidemia   . Hypertension   . Obesity   . Stroke [facility])     Past Surgical History:  Procedure Laterality Date  . BRAIN SURGERY      There were no vitals filed for this visit.  Subjective Assessment - 03/08/19 1350    Subjective  Patient reports no hand pain today, has been compliant with HEP but hasn't been able to walk in his house over the weekend due to the caregivers not wanting to help him per his report.    Pertinent History   Patient is a pleasant 53 year old male who presents to physical therapy for weakness and immobility secondary to CVA.  Had a stroke in Feb 2018. He was previously fully independent, but this stroke caused severe residual deficits, mainly on the right side as well as speech, and now he is unable to walk or perform most of his ADLs on his own. Entire right side is very weak. He still has a little difficulty with speech but his swallowing is improved to baseline. His mom had to move in with him and now is his main caregiver.     How long can you sit comfortably?  5 minutes    How  long can you stand comfortably?  5 minutes    How long can you walk comfortably?  10 ft    Patient Stated Goals  Walk without walker, walk further with walker, get some strength in back     Currently in Pain?  No/denies             TREATMENT     Ther-ex  NuStep L4 x 4 minutes RPM >70 for cardiovascular challenge, therapist monitoring fatigue and challenge;  Sit to stand from blue mat table at lowest setting x 10, pt with heavy reliance on back of knee supported on mat table despite repeated cues by therapist to shift weight forward;   Standing hip extension kick back / step back 12x each LE with RW. Seated rest break between sides due to HR elevation to 120.   Standing with RW: 4" step toe taps CGA, 10x each LE, stabilization required to RW HR elevation to 125 and Sp02 to 94 %  Seated Marching with upright posture and 5lb ankle weights 15;  LAQ 5lb ankle weights, 20x each LE, focus on eccentric control.   Clams with manual resistance x 15, focus on slow muscle activation/recruitment.   Adduction ball squeeze 12x 5  second holds  Hamstring curls with manual resistance x 15 bilateral;   Ambulate 30 ft from NuStep to mat table and then an additional 102 ft later in the session in gym/hallway. Both times performed with RW and wheelchair follow with Min A for Facilitation provided at PSIS for optimal hip rotation and weight shift. Cueing for spatial awareness and upright position.     Pt educated throughout session about proper posture and technique with exercises. Improved exercise technique, movement at target joints, use of target muscles after min to mod verbal, visual, tactile cues    Patient is challenged with upright posture with dynamic mobility with frequent trunk flexion with fatigue however is improved compared to previous sessions with static hip extension. .HR elevation frequently noted throughout session but rapidly returned to functional range with seated rest breaks.   Pt encouraged to continue his HEP. He will continue to benefit from skilled PT services to address strength, balance and mobility deficits in order to improve function at home.                   PT Education - 03/08/19 1352    Education provided  Yes    Education Details  exercise technique, body mechanics    Person(s) Educated  Patient    Methods  Explanation;Demonstration;Tactile cues;Verbal cues    Comprehension  Verbalized understanding;Returned demonstration;Verbal cues required;Tactile cues required       PT Short Term Goals - 02/11/19 1404      PT SHORT TERM GOAL #1   Title  Patient will be independent in home exercise program to improve strength/mobility for better functional independence with ADLs.    Baseline  hep compliant    Time  2    Period  Weeks    Status  Achieved      PT SHORT TERM GOAL #2   Title  Patient will require min cueing for STS transfer with CGA for increased independence with mobility.     Baseline  CGA    Time  2    Period  Weeks    Status  Achieved      PT SHORT TERM GOAL #3   Title  Patient will maintain upright posture for > 15 seconds to demonstrate strengthened postural control muscualture     Baseline  18 seconds     Time  2    Period  Weeks    Status  Achieved        PT Long Term Goals - 02/11/19 0001      PT LONG TERM GOAL #1   Title  Patient will perform 10 MWT in < 1 minute to demonstrate improved ambulatory velocity and capacity.     Baseline  02/11/19: 56 seconds with AFO 4/10: 2 minutes 45 seconds; 5/29: 2 minute 31 seconds; 3 minutes 22 seconds 8/22: 3 minutes 2 seconds  9/12:  2 minutes and 43 seconds 10/15: 60mnute 12 seconds 11/7: 1 minute 21 seconds 12/19: 1 min 16 seconds 3/3: 57 seconds; 08/26/18: 1:41.4s= 0.10 m/s 7/1: 1 minute 14 seconds 9/14: 61 seconds    Time  8    Period  Weeks    Status  Achieved      PT LONG TERM GOAL #2   Title  Patient (< 660years old) will complete five times sit to stand test  in < 10 seconds indicating an increased LE strength and improved balance.    Baseline  02/11/19: 36 seconds 5/19: 20.[place]s [place]  one hand on walker and one on WC, 80% upright posture 7/1: 22 seconds one hand on walker one hand on w/c  9/14: 53 seconds with full upright posture    Time  8    Period  Weeks    Status  Partially Met    Target Date  04/08/19      PT LONG TERM GOAL #3   Title  Patient will increase Berg Balance score by > 6 points (26/56) to demonstrate decreased fall risk during functional activities.    Baseline  9/12: 20/56 10/15: 21/56 11/7: 20/56 12/19: 22/56; 08/26/18: 11/56 7/1: 17/56  9/14: 23/56    Time  8    Period  Weeks    Status  Partially Met    Target Date  04/08/19      PT LONG TERM GOAL #4   Title  Patient will increase BLE gross strength to 4+/5 as to improve functional strength for independent gait, increased standing tolerance and increased ADL ability.    Baseline   02/11/19: hip flexion 4/5 hip extension and abduction 2+/54-/5 RLE, 10/15: 4/5 RLE 11/7: 4/5 RLE 3/3: 4/5 7/1:  hip flexion 4/5 hip extension 2/5 hip abduction/adduciton 2/5     Time  8    Period  Weeks    Status  Partially Met    Target Date  04/08/19      PT LONG TERM GOAL #5   Title  Patient will increase lower extremity functional scale to >40/80 to demonstrate improved functional mobility and increased tolerance with ADLs.     Baseline  02/10/19: 34/56 14/80; 12/26: 21/80; 2/18: 28/80  4/10; 25/80 5/29: 27/80; 7/16: 29/80 8/22: 30/80  9/12: 26/80 (feeling more tired due to moving) 10/15: 37/80 11/7: 23/80 3/3: 32/80; 08/25/18: 28/80 9/15: 32/56    Time  8    Period  Weeks    Status  Partially Met    Target Date  04/08/19      PT LONG TERM GOAL #6   Title  Patient will ambulate 150 ft with least assistive device and Supervision with no breaks to allow for increased mobility within home.    Baseline  11/4: 105 ft with AFO after 10 MWT 11/7: 112f 12/19: 115 ft  3/3: will assess next session,  has done 200 ft with one rest break; 08/26/18: Pt fatigued after 30' today 9/14: 100 ft with CGA    Time  8    Period  Weeks    Status  Partially Met    Target Date  04/08/19      PT LONG TERM GOAL #7   Title  Patient will perform 10 MWT in <30 seconds for improved gait speed, gait mechanics, and functional capacity for mobility.     Baseline  11/4: 56 seconds with AFO and RW     Time  8    Period  Weeks    Status  New    Target Date  04/08/19            Plan - 03/08/19 1413    Clinical Impression Statement  Patient is challenged with upright posture with dynamic mobility with frequent trunk flexion with fatigue however is improved compared to previous sessions with static hip extension. .HR elevation frequently noted throughout session but rapidly returned to functional range with seated rest breaks.  Pt encouraged to continue his HEP. He will continue to benefit from skilled PT services to address strength, balance and mobility deficits in order  to improve function at home.    Rehab Potential  Fair    Clinical Impairments Affecting Rehab Potential  hx of HTN, HLD, CVA, learning disability, Diabetes, brain tumor,     PT Frequency  2x / week    PT Duration  8 weeks    PT Treatment/Interventions  ADLs/Self Care Home Management;Aquatic Therapy;Ultrasound;Moist Heat;Traction;DME Instruction;Gait training;Stair training;Functional mobility training;Therapeutic activities;Therapeutic exercise;Orthotic Fit/Training;Neuromuscular re-education;Balance training;Patient/family education;Manual techniques;Wheelchair mobility training;Passive range of motion;Energy conservation;Taping;Visual/perceptual remediation/compensation    PT Next Visit Plan  balance    PT Home Exercise Plan  TrA contraction, glute sets: needs updates. Potentially some time in supine flat in hospital bed, to improve hip extension deficits.     Consulted and Agree with Plan of Care  Patient    Family Member Consulted  mother        Patient will benefit from skilled therapeutic intervention in order to improve the following deficits and impairments:  Abnormal gait, Decreased activity tolerance, Decreased balance, Decreased knowledge of precautions, Decreased endurance, Decreased coordination, Decreased knowledge of use of DME, Decreased mobility, Decreased range of motion, Difficulty walking, Decreased safety awareness, Decreased strength, Impaired flexibility, Impaired perceived functional ability, Impaired tone, Postural dysfunction, Improper body mechanics, Pain  Visit Diagnosis: Hemiplegia and hemiparesis following cerebral infarction affecting right dominant side (HCC)  Muscle weakness (generalized)  Other abnormalities of gait and mobility     Problem List Patient Active Problem List   Diagnosis Date Noted  . Acute CVA (cerebrovascular accident) ([place]) 05/02/2016  . Left-sided weakness 05/01/2016   Janna Arch, PT, DPT   03/08/2019, 2:32 PM  [place] MAIN [facility] SERVICES [address] [place], [place], [address] Phone: [phone]   Fax:  [phone]  Name: Curtis Powell MRN: [id number] Date of Birth: 1965/12/15

## 2019-03-08 NOTE — Therapy (Addendum)
[place] MAIN [facility] SERVICES [address] [place], [place], [address] Phone: [phone]   Fax:  [phone]  Occupational Therapy Treatment  Patient Details  Name: Curtis Powell MRN: [id number] Date of Birth: 18-Feb-1966 No data recorded  Encounter Date: 03/08/2019  OT End of Session - 03/08/19 1441    Visit Number  [id number]    Number of Visits  161    Date for OT Re-Evaluation  05/05/19    Authorization Type  Progress reporting period starting 12/09/2018    Activity Tolerance  Patient tolerated treatment well    Behavior During Therapy  [facility] for tasks assessed/performed       Past Medical History:  Diagnosis Date  . GERD (gastroesophageal reflux disease)   . Hyperlipidemia   . Hypertension   . Obesity   . Stroke [facility])     Past Surgical History:  Procedure Laterality Date  . BRAIN SURGERY      There were no vitals filed for this visit.  Subjective Assessment - 03/08/19 1439    Subjective   Pt. reports that his left hand feels better today.    Patient is accompanied by:  Family member    Pertinent History  Pt. is a 53 y.o. male who suffered a CVA on 05/01/2016. Pt. was admitted to the hospital. Once discharged, he received Home Health PT and OT services for about a month. Pt. has had multiple CVAs over the past 8 years, and has had multiple falls in the past 6 months. Pt. resides in an apartment. Pt. has caregivers for 80 hours. Pt.'s mother stays with pt. at night, and assists with IADL tasks.      Currently in Pain?  No/denies      OT TREATMENT    Neuro muscular re-education:  Pt. worked on [facility] skills grasping 1" sticks, 1/4" collars, and 1/4" washers. Pt. worked on storing the objects in the palm, and translatory skills moving the items from the palm of the hand to the tip of the 2nd digit, and thumb. Pt. worked on removing the pegs using bilateral alternating hand patterns. Pt. worked on reaching using the [employer]. Pt. grasped  and moved the shapes through 4 vertical dowels of varying heights.  Self-Care:  Pt. required assist with his w/c maneuvering it over the threshold of the doorway. Pt. was able to perform toileting independently in standing. Pt. Was able to donn his zip front sweatshirt with minA.  Response to Treatment:  Pt. required fewer cues today to avoid leaning while reaching with his RUE, however required increased cues to avoid compensating proximally hiking his right shoulder, and laterally flexing his neck to the left during [facility] tasks using his right hand. Pt. continues to work on improving [facility] skills in order to improve, and increase engagement in ADL, and IADL tasks.                      OT Education - 03/08/19 1441    Education provided  Yes    Education Details  reaching, weight shift, ice massage for left hand    Person(s) Educated  Patient    Methods  Explanation;Demonstration    Comprehension  Verbalized understanding;Returned demonstration;Verbal cues required       OT Short Term Goals - 03/03/17 1459      OT SHORT TERM GOAL #1   Title  `        OT Long Term Goals - 03/01/19  [place] #2   Title  Pt. will complete self grooming with minA      OT LONG TERM GOAL #4   Title  Pt. will perform self dressing with minA and A/E as needed.    Baseline  requires assist with socks at times, diff with reaching to get pants over feet.  Able to get jacket on now and shirt with modified independence.    Time  12    Period  Weeks    Status  Partially Met    Target Date  05/05/19      OT LONG TERM GOAL #6   Title  Pt. will write his name efficiently with 100% legibility    Baseline  Pt continues to present with limited legibility and efficiency when writing name to sign in when riding the [place]    Time  12    Period  Weeks    Status  On-going    Target Date  05/05/19      OT LONG TERM GOAL #7   Title  Pt will independently and consistently follow  HEP to increase UE strength to increase functional independence    Baseline  Pt to continues to work towards independence with exercises    Time  12    Period  Weeks    Status  On-going    Target Date  05/05/19      OT LONG TERM GOAL #8   Title  Pt. will require supervision ironing a shirt.    Baseline  Pt. requires CGA-minA, and complete set-up seated using a tabletop iron    Time  12    Period  Weeks    Status  On-going    Target Date  05/05/19      OT LONG TERM GOAL  #9   Baseline  Pt. will independently be able to sew a button onto a shirt.    Time  12    Period  Weeks    Status  On-going    Target Date  05/05/19      OT LONG TERM GOAL  #10   TITLE  Pt. will independently be able to throw a ball    Baseline  Pt. continues to be limited    Time  12    Period  Weeks    Status  On-going    Target Date  05/05/19      OT LONG TERM GOAL  #11   TITLE  Pt. will improve UE functional reaching to be able to independently use his RUE to hand his clothes in the closest.    Baseline  Pt. continues to have difficulty.    Time  12    Period  Weeks    Status  On-going    Target Date  05/05/19            Plan - 03/08/19 1442    Clinical Impression Statement Pt. required fewer cues today to avoid leaning while reaching with his RUE, however required increased cues to avoid compensating proximally hiking his right shoulder, and laterally flexing his neck to the left during [facility] tasks using his right hand. Pt. continues to work on improving [facility] skills in order to improve, and increase engagement in ADL, and IADL tasks.   OT Occupational Profile and History  Problem Focused Assessment - Including review of records relating to presenting problem    Occupational performance deficits (Please refer to evaluation  for details):  ADL's;IADL's    Body Structure / Function / Physical Skills  ADL;FMC;ROM;IADL;Pain;Coordination    Rehab Potential  Fair    Clinical Decision Making  Several  treatment options, min-mod task modification necessary    OT Frequency  2x / week    OT Duration  12 weeks    OT Treatment/Interventions  Self-care/ADL training;Patient/family education;DME and/or AE instruction;Therapeutic exercise;Moist Heat;Neuromuscular education    Consulted and Agree with Plan of Care  Patient       Patient will benefit from skilled therapeutic intervention in order to improve the following deficits and impairments:   Body Structure / Function / Physical Skills: ADL, FMC, ROM, IADL, Pain, Coordination       Visit Diagnosis: Muscle weakness (generalized)  Other lack of coordination    Problem List Patient Active Problem List   Diagnosis Date Noted  . Acute CVA (cerebrovascular accident) ([place]) 05/02/2016  . Left-sided weakness 05/01/2016    Harrel Carina, MS, OTR/L 03/08/2019, 2:55 PM  [place] MAIN [facility] SERVICES [address] [place], [place], [address] Phone: [phone]   Fax:  [phone]  Name: Curtis Powell MRN: [id number] Date of Birth: 1966/04/03

## 2019-03-10 ENCOUNTER — Ambulatory Visit: Payer: Medicare HMO | Attending: Family Medicine

## 2019-03-10 ENCOUNTER — Other Ambulatory Visit: Payer: Self-pay

## 2019-03-10 ENCOUNTER — Encounter: Payer: Self-pay | Admitting: Occupational Therapy

## 2019-03-10 ENCOUNTER — Ambulatory Visit: Payer: Medicare HMO | Admitting: Occupational Therapy

## 2019-03-10 DIAGNOSIS — M6281 Muscle weakness (generalized): Secondary | ICD-10-CM | POA: Insufficient documentation

## 2019-03-10 DIAGNOSIS — R278 Other lack of coordination: Secondary | ICD-10-CM

## 2019-03-10 DIAGNOSIS — R2689 Other abnormalities of gait and mobility: Secondary | ICD-10-CM | POA: Insufficient documentation

## 2019-03-10 DIAGNOSIS — I69351 Hemiplegia and hemiparesis following cerebral infarction affecting right dominant side: Secondary | ICD-10-CM | POA: Insufficient documentation

## 2019-03-10 NOTE — Therapy (Signed)
[place] MAIN [facility] SERVICES [address] [place], [place], [address] Phone: [phone]   Fax:  [phone]  Physical Therapy Treatment  Patient Details  Name: Curtis Powell MRN: [id number] Date of Birth: [birth year] No data recorded  Encounter Date: 03/10/2019  PT End of Session - 03/10/19 1436    Visit Number  158    Number of Visits  166    Date for PT Re-Evaluation  04/08/19    Authorization Type  8    Authorization Time Period  authorized 11/16/18-05/19/19    PT Start Time  8416    PT Stop Time  1514    PT Time Calculation (min)  45 min    Equipment Utilized During Treatment  Gait belt    Activity Tolerance  Patient tolerated treatment well;Patient limited by fatigue    Behavior During Therapy  WFL for tasks assessed/performed       Past Medical History:  Diagnosis Date  . GERD (gastroesophageal reflux disease)   . Hyperlipidemia   . Hypertension   . Obesity   . Stroke [facility])     Past Surgical History:  Procedure Laterality Date  . BRAIN SURGERY      There were no vitals filed for this visit.  Subjective Assessment - 03/10/19 1434    Subjective  Patient reports he was able to walk around the house two times yesterday with his mom. No stumbles or falls since last session.    Pertinent History   Patient is a pleasant 53 year old male who presents to physical therapy for weakness and immobility secondary to CVA.  Had a stroke in Feb 2018. He was previously fully independent, but this stroke caused severe residual deficits, mainly on the right side as well as speech, and now he is unable to walk or perform most of his ADLs on his own. Entire right side is very weak. He still has a little difficulty with speech but his swallowing is improved to baseline. His mom had to move in with him and now is his main caregiver.     How long can you sit comfortably?  5 minutes    How long can you stand comfortably?  5 minutes    How long can  you walk comfortably?  10 ft    Patient Stated Goals  Walk without walker, walk further with walker, get some strength in back     Currently in Pain?  No/denies            TREATMENT     Ther-ex  NuStep L4 x 4 minutes RPM >70 for cardiovascular challenge, therapist monitoring fatigue and challenge;   Seated Marching with upright posture and 5lb ankle weights 20x each LE  LAQ 5lb ankle weights, 15x each LE, focus on eccentric control with 3 second hold at knee extension arc   Straight leg abduction/adduction with 5lb ankle weight 12x each LE   IR/ER squeezing ball between knees with 5lb ankle weights 15x each LE  Clams with BTB x 15x each LE, one LE at a time, focus on slow muscle activation/recruitment.    Adduction ball squeeze 12x 5 second holds   Hamstring curls BTB x 15 bilateral;  2" step coordinated toe taps 30 seconds for muscle activation/sequeincing.    Ambulate 96 ft lap around the gym  with RW  With occasional Min A for Facilitation provided at PSIS /ASIS for optimal hip rotation and weight shift. Cueing for spatial awareness  and upright position. Patient reports pain in L hand after walking.      Pt educated throughout session about proper posture and technique with exercises. Improved exercise technique, movement at target joints, use of target muscles after min to mod verbal, visual, tactile cues  Patient presents with excellent motivation throughout physical therapy session. Patient reports some pain in L hand when walking. Focus on seated interventions performed after ambulation due to pain in L hand when gripping walker. He will continue to benefit from skilled PT services to address strength, balance and mobility deficits in order to improve function at home.                    PT Education - 03/10/19 1435    Education provided  Yes    Education Details  exercise technique, body mechanics    Person(s) Educated  Patient    Methods   Explanation;Demonstration;Tactile cues;Verbal cues    Comprehension  Verbalized understanding;Returned demonstration;Verbal cues required;Tactile cues required       PT Short Term Goals - 02/11/19 1404      PT SHORT TERM GOAL #1   Title  Patient will be independent in home exercise program to improve strength/mobility for better functional independence with ADLs.    Baseline  hep compliant    Time  2    Period  Weeks    Status  Achieved      PT SHORT TERM GOAL #2   Title  Patient will require min cueing for STS transfer with CGA for increased independence with mobility.     Baseline  CGA    Time  2    Period  Weeks    Status  Achieved      PT SHORT TERM GOAL #3   Title  Patient will maintain upright posture for > 15 seconds to demonstrate strengthened postural control muscualture     Baseline  18 seconds     Time  2    Period  Weeks    Status  Achieved        PT Long Term Goals - 02/11/19 0001      PT LONG TERM GOAL #1   Title  Patient will perform 10 MWT in < 1 minute to demonstrate improved ambulatory velocity and capacity.     Baseline  02/11/19: 56 seconds with AFO 4/10: 2 minutes 45 seconds; 5/29: 2 minute 31 seconds; 3 minutes 22 seconds 8/22: 3 minutes 2 seconds  9/12:  2 minutes and 43 seconds 10/15: 41mnute 12 seconds 11/7: 1 minute 21 seconds 12/19: 1 min 16 seconds 3/3: 57 seconds; 08/26/18: 1:41.4s= 0.10 m/s 7/1: 1 minute 14 seconds 9/14: 61 seconds    Time  8    Period  Weeks    Status  Achieved      PT LONG TERM GOAL #2   Title  Patient (< 762years old) will complete five times sit to stand test in < 10 seconds indicating an increased LE strength and improved balance.    Baseline  02/11/19: 36 seconds 5/19: 20.6s from WOtho one hand on walker and one on WC, 80% upright posture 7/1: 22 seconds one hand on walker one hand on w/c  9/14: 53 seconds with full upright posture    Time  8    Period  Weeks    Status  Partially Met    Target Date  04/08/19      PT LONG  TERM GOAL #3  Title  Patient will increase Berg Balance score by > 6 points (26/56) to demonstrate decreased fall risk during functional activities.    Baseline  9/12: 20/56 10/15: 21/56 11/7: 20/56 12/19: 22/56; 08/26/18: 11/56 7/1: 17/56  9/14: 23/56    Time  8    Period  Weeks    Status  Partially Met    Target Date  04/08/19      PT LONG TERM GOAL #4   Title  Patient will increase BLE gross strength to 4+/5 as to improve functional strength for independent gait, increased standing tolerance and increased ADL ability.    Baseline   02/11/19: hip flexion 4/5 hip extension and abduction 2+/54-/5 RLE, 10/15: 4/5 RLE 11/7: 4/5 RLE 3/3: 4/5 7/1:  hip flexion 4/5 hip extension 2/5 hip abduction/adduciton 2/5     Time  8    Period  Weeks    Status  Partially Met    Target Date  04/08/19      PT LONG TERM GOAL #5   Title  Patient will increase lower extremity functional scale to >40/80 to demonstrate improved functional mobility and increased tolerance with ADLs.     Baseline  02/10/19: 34/56 14/80; 12/26: 21/80; 2/18: 28/80  4/10; 25/80 5/29: 27/80; 7/16: 29/80 8/22: 30/80  9/12: 26/80 (feeling more tired due to moving) 10/15: 37/80 11/7: 23/80 3/3: 32/80; 08/25/18: 28/80 9/15: 32/56    Time  8    Period  Weeks    Status  Partially Met    Target Date  04/08/19      PT LONG TERM GOAL #6   Title  Patient will ambulate 150 ft with least assistive device and Supervision with no breaks to allow for increased mobility within home.    Baseline  11/4: 105 ft with AFO after 10 MWT 11/7: 137f 12/19: 115 ft  3/3: will assess next session, has done 200 ft with one rest break; 08/26/18: Pt fatigued after 30' today 9/14: 100 ft with CGA    Time  8    Period  Weeks    Status  Partially Met    Target Date  04/08/19      PT LONG TERM GOAL #7   Title  Patient will perform 10 MWT in <30 seconds for improved gait speed, gait mechanics, and functional capacity for mobility.     Baseline  11/4: 56 seconds with  AFO and RW     Time  8    Period  Weeks    Status  New    Target Date  04/08/19            Plan - 03/10/19 1454    Clinical Impression Statement  Patient presents with excellent motivation throughout physical therapy session. Patient reports some pain in L hand when walking. Focus on seated interventions performed after ambulation due to pain in L hand when gripping walker. He will continue to benefit from skilled PT services to address strength, balance and mobility deficits in order to improve function at home.    Rehab Potential  Fair    Clinical Impairments Affecting Rehab Potential  hx of HTN, HLD, CVA, learning disability, Diabetes, brain tumor,     PT Frequency  2x / week    PT Duration  8 weeks    PT Treatment/Interventions  ADLs/Self Care Home Management;Aquatic Therapy;Ultrasound;Moist Heat;Traction;DME Instruction;Gait training;Stair training;Functional mobility training;Therapeutic activities;Therapeutic exercise;Orthotic Fit/Training;Neuromuscular re-education;Balance training;Patient/family education;Manual techniques;Wheelchair mobility training;Passive range of motion;Energy conservation;Taping;Visual/perceptual remediation/compensation  PT Next Visit Plan  balance    PT Home Exercise Plan  TrA contraction, glute sets: needs updates. Potentially some time in supine flat in hospital bed, to improve hip extension deficits.     Consulted and Agree with Plan of Care  Patient    Family Member Consulted  mother       Patient will benefit from skilled therapeutic intervention in order to improve the following deficits and impairments:  Abnormal gait, Decreased activity tolerance, Decreased balance, Decreased knowledge of precautions, Decreased endurance, Decreased coordination, Decreased knowledge of use of DME, Decreased mobility, Decreased range of motion, Difficulty walking, Decreased safety awareness, Decreased strength, Impaired flexibility, Impaired perceived functional  ability, Impaired tone, Postural dysfunction, Improper body mechanics, Pain  Visit Diagnosis: Muscle weakness (generalized)  Other lack of coordination  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 Acute CVA (cerebrovascular accident) ([place]) 05/02/2016  . Left-sided weakness 05/01/2016   Janna Arch, PT, DPT   03/10/2019, 3:15 PM  [place] MAIN [facility] SERVICES [address] [place], [place], [address] Phone: [phone]   Fax:  [phone]  Name: AULTON ROUTT MRN: [id number] Date of Birth: 21-Mar-1966

## 2019-03-10 NOTE — Therapy (Signed)
[place] MAIN [facility] SERVICES [address] [place], [place], [address] Phone: [phone]   Fax:  [phone]  Occupational Therapy Treatment  Patient Details  Name: Curtis Powell MRN: [id number] Date of Birth: 09/24/1965 No data recorded  Encounter Date: 03/10/2019  OT End of Session - 03/10/19 1524    Visit Number  [id number]    Number of Visits  161    Date for OT Re-Evaluation  05/05/19    Authorization Type  Progress reporting period starting 12/09/2018    OT Start Time  1520    OT Stop Time  1600    OT Time Calculation (min)  40 min    Equipment Utilized During Treatment  Long handled shoe horn    Activity Tolerance  Patient tolerated treatment well    Behavior During Therapy  WFL for tasks assessed/performed       Past Medical History:  Diagnosis Date  . GERD (gastroesophageal reflux disease)   . Hyperlipidemia   . Hypertension   . Obesity   . Stroke [facility])     Past Surgical History:  Procedure Laterality Date  . BRAIN SURGERY      There were no vitals filed for this visit.  Subjective Assessment - 03/10/19 1524    Subjective   Pt. reports that his left hand feels better today.    Patient is accompanied by:  Family member    Pertinent History  Pt. is a 53 y.o. male who suffered a CVA on 05/01/2016. Pt. was admitted to the hospital. Once discharged, he received Home Health PT and OT services for about a month. Pt. has had multiple CVAs over the past 8 years, and has had multiple falls in the past 6 months. Pt. resides in an apartment. Pt. has caregivers for 80 hours. Pt.'s mother stays with pt. at night, and assists with IADL tasks.      Currently in Pain?  No/denies      OT TREATMENT    Neuro muscular re-education:  Pt. worked on right [facility] skills grasping 1" sticks, 1/4" collars, and 1/4" washers. Pt. worked on storing the objects in the palm, and translatory skills moving the items from the palm of the hand to the tip of the  2nd digit, and thumb. Pt. worked on removing the pegs using bilateral alternating hand patterns.  Self-Care:  Pt. required assist with his w/c maneuvering it over the threshold of the doorway. Pt. was able to perform toileting independently in standing. Pt. Was able to donn his zip front sweatshirt with minA. Pt. required maxA to attempt to donn a winter coat secondary to the coat size.  Response to Treatment:  Pt. is making progress, and has improved with lifting items, and retrieving items from the refrigerator. Pt. continues to compensate proximally with the neck, and trunk when reaching with the RUE. Pt. presents with less right shoulder hiking, and leaning to the left during [facility] skills. Pt. continues to work on improving RUE strength, motor control, and [facility] skills in order to improve UE functioning during ADLs, and IADL tasks.                          OT Education - 03/10/19 1524    Education provided  Yes    Education Details  reaching, weight shift, ice massage for left hand    Person(s) Educated  Patient    Methods  Explanation;Demonstration    Comprehension  Verbalized understanding;Returned demonstration;Verbal cues required       OT Short Term Goals - 03/03/17 1459      OT SHORT TERM GOAL #1   Title  `        OT Long Term Goals - 03/01/19 1532      OT LONG TERM GOAL #2   Title  Pt. will complete self grooming with minA      OT LONG TERM GOAL #4   Title  Pt. will perform self dressing with minA and A/E as needed.    Baseline  requires assist with socks at times, diff with reaching to get pants over feet.  Able to get jacket on now and shirt with modified independence.    Time  12    Period  Weeks    Status  Partially Met    Target Date  05/05/19      OT LONG TERM GOAL #6   Title  Pt. will write his name efficiently with 100% legibility    Baseline  Pt continues to present with limited legibility and efficiency when writing name to sign in  when riding the Needles    Time  12    Period  Weeks    Status  On-going    Target Date  05/05/19      OT LONG TERM GOAL #7   Title  Pt will independently and consistently follow HEP to increase UE strength to increase functional independence    Baseline  Pt to continues to work towards independence with exercises    Time  12    Period  Weeks    Status  On-going    Target Date  05/05/19      OT LONG TERM GOAL #8   Title  Pt. will require supervision ironing a shirt.    Baseline  Pt. requires CGA-minA, and complete set-up seated using a tabletop iron    Time  12    Period  Weeks    Status  On-going    Target Date  05/05/19      OT LONG TERM GOAL  #9   Baseline  Pt. will independently be able to sew a button onto a shirt.    Time  12    Period  Weeks    Status  On-going    Target Date  05/05/19      OT LONG TERM GOAL  #10   TITLE  Pt. will independently be able to throw a ball    Baseline  Pt. continues to be limited    Time  12    Period  Weeks    Status  On-going    Target Date  05/05/19      OT LONG TERM GOAL  #11   TITLE  Pt. will improve UE functional reaching to be able to independently use his RUE to hand his clothes in the closest.    Baseline  Pt. continues to have difficulty.    Time  12    Period  Weeks    Status  On-going    Target Date  05/05/19            Plan - 03/10/19 1525    Clinical Impression Statement Pt. is making progress, and has improved with lifting items, and retrieving items from the refrigerator. Pt. continues to compensate proximally with the neck, and trunk when reaching with the RUE. Pt. presents with less right shoulder hiking, and leaning to the left during  [facility] skills. Pt. continues to work on improving RUE strength, motor control, and [facility] skills in order to improve UE functioning during ADLs, and IADL tasks.    OT Occupational Profile and History  Problem Focused Assessment - Including review of records relating to presenting problem     Occupational performance deficits (Please refer to evaluation for details):  ADL's;IADL's    Body Structure / Function / Physical Skills  ADL;FMC;ROM;IADL;Pain;Coordination    Rehab Potential  Fair    Clinical Decision Making  Several treatment options, min-mod task modification necessary    OT Frequency  2x / week    OT Duration  12 weeks    OT Treatment/Interventions  Self-care/ADL training;Patient/family education;DME and/or AE instruction;Therapeutic exercise;Moist Heat;Neuromuscular education    Consulted and Agree with Plan of Care  Patient       Patient will benefit from skilled therapeutic intervention in order to improve the following deficits and impairments:   Body Structure / Function / Physical Skills: ADL, FMC, ROM, IADL, Pain, Coordination       Visit Diagnosis: Muscle weakness (generalized)  Other lack of coordination    Problem List Patient Active Problem List   Diagnosis Date Noted  . Acute CVA (cerebrovascular accident) ([place]) 05/02/2016  . Left-sided weakness 05/01/2016    Harrel Carina, MS, OTR/L 03/10/2019, 3:42 PM  [place] MAIN [facility] SERVICES [address] [place], [place], [address] Phone: [phone]   Fax:  [phone]  Name: Curtis Powell MRN: [id number] Date of Birth: [birth year]

## 2019-03-15 ENCOUNTER — Ambulatory Visit: Payer: Medicare HMO | Admitting: Occupational Therapy

## 2019-03-15 ENCOUNTER — Ambulatory Visit: Payer: Medicare HMO

## 2019-03-17 ENCOUNTER — Other Ambulatory Visit: Payer: Self-pay

## 2019-03-17 ENCOUNTER — Encounter: Payer: Self-pay | Admitting: Occupational Therapy

## 2019-03-17 ENCOUNTER — Ambulatory Visit: Payer: Medicare HMO | Admitting: Occupational Therapy

## 2019-03-17 ENCOUNTER — Ambulatory Visit: Payer: Medicare HMO

## 2019-03-17 DIAGNOSIS — I69351 Hemiplegia and hemiparesis following cerebral infarction affecting right dominant side: Secondary | ICD-10-CM

## 2019-03-17 DIAGNOSIS — R278 Other lack of coordination: Secondary | ICD-10-CM

## 2019-03-17 DIAGNOSIS — M6281 Muscle weakness (generalized): Secondary | ICD-10-CM | POA: Diagnosis not present

## 2019-03-17 DIAGNOSIS — R2689 Other abnormalities of gait and mobility: Secondary | ICD-10-CM

## 2019-03-17 NOTE — Therapy (Signed)
[place] MAIN [facility] SERVICES [address] [place], [place], [address] Phone: [phone]   Fax:  [phone]  Physical Therapy Treatment  Patient Details  Name: Curtis Powell MRN: [id number] Date of Birth: 08-13-1965 No data recorded  Encounter Date: 03/17/2019  PT End of Session - 03/17/19 1442    Visit Number  159    Number of Visits  166    Date for PT Re-Evaluation  04/08/19    Authorization Type  9    Authorization Time Period  authorized 11/16/18-05/19/19    PT Start Time  1430    PT Stop Time  1514    PT Time Calculation (min)  44 min    Equipment Utilized During Treatment  Gait belt    Activity Tolerance  Patient tolerated treatment well;Patient limited by fatigue    Behavior During Therapy  WFL for tasks assessed/performed       Past Medical History:  Diagnosis Date  . GERD (gastroesophageal reflux disease)   . Hyperlipidemia   . Hypertension   . Obesity   . Stroke [facility])     Past Surgical History:  Procedure Laterality Date  . BRAIN SURGERY      There were no vitals filed for this visit.  Subjective Assessment - 03/17/19 1440    Subjective  Patient reports he has been able to do some walking in the house. No falls since last visit, missed last session due to not feeling "up to it".    Pertinent History   Patient is a pleasant 53 year old male who presents to physical therapy for weakness and immobility secondary to CVA.  Had a stroke in Feb 2018. He was previously fully independent, but this stroke caused severe residual deficits, mainly on the right side as well as speech, and now he is unable to walk or perform most of his ADLs on his own. Entire right side is very weak. He still has a little difficulty with speech but his swallowing is improved to baseline. His mom had to move in with him and now is his main caregiver.     How long can you sit comfortably?  5 minutes    How long can you stand comfortably?  5 minutes     How long can you walk comfortably?  10 ft    Patient Stated Goals  Walk without walker, walk further with walker, get some strength in back     Currently in Pain?  Yes    Pain Score  3     Pain Location  Knee    Pain Orientation  Left    Pain Descriptors / Indicators  Aching    Pain Type  Acute pain    Pain Onset  1 to 4 weeks ago    Pain Frequency  Intermittent       Nustep level 4 RPM> 60 for cardiovascular support 4 minutes  SPT with RW and CGA, cueing for sequencing and upright posture  To/from wheelchair to/from [employer] stand no UE support 30-60 seconds ; multiple trials  Standing with SUE support on RW; upward, forward, and downward reaching with opp UE. 5x each side no LOB    Seated Marching with upright postureand 5lb ankle weights 15; LAQ 5lb ankle weights,20x each LE, focus on eccentric control.  5lb ankle weights kicking soccer ball 10x each LE, focus on timing of muscle recruitment with mobility; then additional challenge of kicking ball between goal 10x  each LE   Clams with BTB resistance x 15, focus on slow muscle activation/recruitment  Hamstring curls BTB resistance 12x each LE, cueing for full arc of movement   HR monitored throughout session with occasional rests required for return to therapeutic range.    Patient is challenged with holding prolonged static standing without UE support. He is able to perform for short duration but fatigues causing trunk flexion and occasional posterior LOB. Knee pain limited ambulation this session. He will continue to benefit from skilled PT services to address strength, balance and mobility deficits in order to improve function at home.                    PT Education - 03/17/19 1441    Education provided  Yes    Education Details  exercise technique, body mechanics    Person(s) Educated  Patient    Methods  Explanation;Demonstration;Tactile cues;Verbal cues    Comprehension  Verbalized  understanding;Returned demonstration;Verbal cues required;Tactile cues required       PT Short Term Goals - 02/11/19 1404      PT SHORT TERM GOAL #1   Title  Patient will be independent in home exercise program to improve strength/mobility for better functional independence with ADLs.    Baseline  hep compliant    Time  2    Period  Weeks    Status  Achieved      PT SHORT TERM GOAL #2   Title  Patient will require min cueing for STS transfer with CGA for increased independence with mobility.     Baseline  CGA    Time  2    Period  Weeks    Status  Achieved      PT SHORT TERM GOAL #3   Title  Patient will maintain upright posture for > 15 seconds to demonstrate strengthened postural control muscualture     Baseline  18 seconds     Time  2    Period  Weeks    Status  Achieved        PT Long Term Goals - 02/11/19 0001      PT LONG TERM GOAL #1   Title  Patient will perform 10 MWT in < 1 minute to demonstrate improved ambulatory velocity and capacity.     Baseline  02/11/19: 56 seconds with AFO 4/10: 2 minutes 45 seconds; 5/29: 2 minute 31 seconds; 3 minutes 22 seconds 8/22: 3 minutes 2 seconds  9/12:  2 minutes and 43 seconds 10/15: 12mnute 12 seconds 11/7: 1 minute 21 seconds 12/19: 1 min 16 seconds 3/3: 57 seconds; 08/26/18: 1:41.4s= 0.10 m/s 7/1: 1 minute 14 seconds 9/14: 61 seconds    Time  8    Period  Weeks    Status  Achieved      PT LONG TERM GOAL #2   Title  Patient (< 641years old) will complete five times sit to stand test in < 10 seconds indicating an increased LE strength and improved balance.    Baseline  02/11/19: 36 seconds 5/19: 20.6s [place] one hand on walker and one on WC, 80% upright posture 7/1: 22 seconds one hand on walker one hand on w/c  9/14: 53 seconds with full upright posture    Time  8    Period  Weeks    Status  Partially Met    Target Date  04/08/19      PT LONG TERM GOAL #3  Title  Patient will increase Berg Balance score by > 6 points  (26/56) to demonstrate decreased fall risk during functional activities.    Baseline  9/12: 20/56 10/15: 21/56 11/7: 20/56 12/19: 22/56; 08/26/18: 11/56 7/1: 17/56  9/14: 23/56    Time  8    Period  Weeks    Status  Partially Met    Target Date  04/08/19      PT LONG TERM GOAL #4   Title  Patient will increase BLE gross strength to 4+/5 as to improve functional strength for independent gait, increased standing tolerance and increased ADL ability.    Baseline   02/11/19: hip flexion 4/5 hip extension and abduction 2+/54-/5 RLE, 10/15: 4/5 RLE 11/7: 4/5 RLE 3/3: 4/5 7/1:  hip flexion 4/5 hip extension 2/5 hip abduction/adduciton 2/5     Time  8    Period  Weeks    Status  Partially Met    Target Date  04/08/19      PT LONG TERM GOAL #5   Title  Patient will increase lower extremity functional scale to >40/80 to demonstrate improved functional mobility and increased tolerance with ADLs.     Baseline  02/10/19: 34/56 14/80; 12/26: 21/80; 2/18: 28/80  4/10; 25/80 5/29: 27/80; 7/16: 29/80 8/22: 30/80  9/12: 26/80 (feeling more tired due to moving) 10/15: 37/80 11/7: 23/80 3/3: 32/80; 08/25/18: 28/80 9/15: 32/56    Time  8    Period  Weeks    Status  Partially Met    Target Date  04/08/19      PT LONG TERM GOAL #6   Title  Patient will ambulate 150 ft with least assistive device and Supervision with no breaks to allow for increased mobility within home.    Baseline  11/4: 105 ft with AFO after 10 MWT 11/7: 112f 12/19: 115 ft  3/3: will assess next session, has done 200 ft with one rest break; 08/26/18: Pt fatigued after 30' today 9/14: 100 ft with CGA    Time  8    Period  Weeks    Status  Partially Met    Target Date  04/08/19      PT LONG TERM GOAL #7   Title  Patient will perform 10 MWT in <30 seconds for improved gait speed, gait mechanics, and functional capacity for mobility.     Baseline  11/4: 56 seconds with AFO and RW     Time  8    Period  Weeks    Status  New    Target Date   04/08/19            Plan - 03/17/19 1510    Clinical Impression Statement  Patient is challenged with holding prolonged static standing without UE support. He is able to perform for short duration but fatigues causing trunk flexion and occasional posterior LOB. Knee pain limited ambulation this session. He will continue to benefit from skilled PT services to address strength, balance and mobility deficits in order to improve function at home.    Rehab Potential  Fair    Clinical Impairments Affecting Rehab Potential  hx of HTN, HLD, CVA, learning disability, Diabetes, brain tumor,     PT Frequency  2x / week    PT Duration  8 weeks    PT Treatment/Interventions  ADLs/Self Care Home Management;Aquatic Therapy;Ultrasound;Moist Heat;Traction;DME Instruction;Gait training;Stair training;Functional mobility training;Therapeutic activities;Therapeutic exercise;Orthotic Fit/Training;Neuromuscular re-education;Balance training;Patient/family education;Manual techniques;Wheelchair mobility training;Passive range of motion;Energy conservation;Taping;Visual/perceptual remediation/compensation  PT Next Visit Plan  balance    PT Home Exercise Plan  TrA contraction, glute sets: needs updates. Potentially some time in supine flat in hospital bed, to improve hip extension deficits.     Consulted and Agree with Plan of Care  Patient    Family Member Consulted  mother       Patient will benefit from skilled therapeutic intervention in order to improve the following deficits and impairments:  Abnormal gait, Decreased activity tolerance, Decreased balance, Decreased knowledge of precautions, Decreased endurance, Decreased coordination, Decreased knowledge of use of DME, Decreased mobility, Decreased range of motion, Difficulty walking, Decreased safety awareness, Decreased strength, Impaired flexibility, Impaired perceived functional ability, Impaired tone, Postural dysfunction, Improper body mechanics,  Pain  Visit Diagnosis: Muscle weakness (generalized)  Other lack of coordination  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 Acute CVA (cerebrovascular accident) ([place]) 05/02/2016  . Left-sided weakness 05/01/2016   Janna Arch, PT, DPT   03/17/2019, 3:21 PM  [place] MAIN [facility] SERVICES [address] [place], [place], [address] Phone: [phone]   Fax:  [phone]  Name: Curtis Powell MRN: [id number] Date of Birth: 1966/03/01

## 2019-03-17 NOTE — Therapy (Signed)
[place] MAIN [facility] SERVICES [address] [place], [place], [address] Phone: [phone]   Fax:  [phone]  Occupational Therapy Treatment  Patient Details  Name: Curtis Powell MRN: [id number] Date of Birth: Aug 05, 1965 No data recorded  Encounter Date: 03/17/2019  OT End of Session - 03/17/19 1531    Visit Number  154    Number of Visits  185    Date for OT Re-Evaluation  05/05/19    Authorization Type  Progress reporting period starting 12/09/2018    OT Start Time  1515    OT Stop Time  1600    OT Time Calculation (min)  45 min    Equipment Utilized During Treatment  Long handled shoe horn    Behavior During Therapy  WFL for tasks assessed/performed       Past Medical History:  Diagnosis Date  . GERD (gastroesophageal reflux disease)   . Hyperlipidemia   . Hypertension   . Obesity   . Stroke [facility])     Past Surgical History:  Procedure Laterality Date  . BRAIN SURGERY      There were no vitals filed for this visit.  Subjective Assessment - 03/17/19 1528    Subjective   Pt. reports that his left hand feels better today.    Patient is accompanied by:  Family member    Pertinent History  Pt. is a 53 y.o. male who suffered a CVA on 05/01/2016. Pt. was admitted to the hospital. Once discharged, he received Home Health PT and OT services for about a month. Pt. has had multiple CVAs over the past 8 years, and has had multiple falls in the past 6 months. Pt. resides in an apartment. Pt. has caregivers for 80 hours. Pt.'s mother stays with pt. at night, and assists with IADL tasks.      Patient Stated Goals  To be able to throw a ball and dribble a ball, do as much as I can for myself.     Currently in Pain?  Yes      OT TREATMENT    Neuro muscular re-education:  Pt. worked on grasping, and positioning magnetic 1/2" washers, and reaching up to place them on a whiteboard positioned at a vertical angle on an elevated surface.  Pt.  Worked on [facility] skills grasping 1/2" flat washers, and placing them on the hooks, as well as translatory movements when placing, and removing the washers. Pt. worked on sorting, and Chief of Staff with his right hand.  Self-care:  Pt. performed toileting independently while standing at the commode. Pt. Requires assist to move the w/c over the threshold of the doorway, and position the w/c. Pt. Worked on UE dressing donning a jacket with min-modA.   Response to Treatment:  Pt. is steady progress, and has improved with functional reaching with the RUE, and hand. Pt. tends to drop multiple objects from the palm of his hand when attempting to hold them while reaching. Pt.'s right hand cramps when attempting to perform translatory movements of the right hand, and when moving objects through his hand. Pt. has imprved with toileting skills, however requires assist to move the w/c over the threshold of the doorway, position the w/c at the toilet to be able to close the door to the bathroom. Pt. continues to require assist donning a jacket.                        OT Education -  03/17/19 1530    Education provided  Yes    Education Details  reaching    Person(s) Educated  Patient    Methods  Explanation;Demonstration    Comprehension  Verbalized understanding;Returned demonstration;Verbal cues required       OT Short Term Goals - 03/03/17 1459      OT SHORT TERM GOAL #1   Title  `        OT Long Term Goals - 03/01/19 1532      OT LONG TERM GOAL #2   Title  Pt. will complete self grooming with minA      OT LONG TERM GOAL #4   Title  Pt. will perform self dressing with minA and A/E as needed.    Baseline  requires assist with socks at times, diff with reaching to get pants over feet.  Able to get jacket on now and shirt with modified independence.    Time  12    Period  Weeks    Status  Partially Met    Target Date  05/05/19      OT LONG TERM GOAL #6   Title  Pt.  will write his name efficiently with 100% legibility    Baseline  Pt continues to present with limited legibility and efficiency when writing name to sign in when riding the [place]    Time  12    Period  Weeks    Status  On-going    Target Date  05/05/19      OT LONG TERM GOAL #7   Title  Pt will independently and consistently follow HEP to increase UE strength to increase functional independence    Baseline  Pt to continues to work towards independence with exercises    Time  12    Period  Weeks    Status  On-going    Target Date  05/05/19      OT LONG TERM GOAL #8   Title  Pt. will require supervision ironing a shirt.    Baseline  Pt. requires CGA-minA, and complete set-up seated using a tabletop iron    Time  12    Period  Weeks    Status  On-going    Target Date  05/05/19      OT LONG TERM GOAL  #9   Baseline  Pt. will independently be able to sew a button onto a shirt.    Time  12    Period  Weeks    Status  On-going    Target Date  05/05/19      OT LONG TERM GOAL  #10   TITLE  Pt. will independently be able to throw a ball    Baseline  Pt. continues to be limited    Time  12    Period  Weeks    Status  On-going    Target Date  05/05/19      OT LONG TERM GOAL  #11   TITLE  Pt. will improve UE functional reaching to be able to independently use his RUE to hand his clothes in the closest.    Baseline  Pt. continues to have difficulty.    Time  12    Period  Weeks    Status  On-going    Target Date  05/05/19            Plan - 03/17/19 1532    Clinical Impression Statement  Pt. is steady progress, and has improved with functional  reaching with the RUE, and hand. Pt. tends to drop multiple objects from the palm of his hand when attempting to hold them while reaching. Pt.'s right hand cramps when attempting to perform translatory movements of the right hand, and when moving objects through his hand. Pt. has imprved with toileting skills, however requires assist to  move the w/c over the threshold of the doorway, position the w/c at the toilet to be able to close the door to the bathroom. Pt. continues to require assist donning a jacket.    OT Occupational Profile and History  Problem Focused Assessment - Including review of records relating to presenting problem    Occupational performance deficits (Please refer to evaluation for details):  ADL's;IADL's    Body Structure / Function / Physical Skills  ADL;FMC;ROM;IADL;Pain;Coordination    Rehab Potential  Fair    Clinical Decision Making  Several treatment options, min-mod task modification necessary    OT Frequency  2x / week    OT Duration  12 weeks    OT Treatment/Interventions  Self-care/ADL training;Patient/family education;DME and/or AE instruction;Therapeutic exercise;Moist Heat;Neuromuscular education    Consulted and Agree with Plan of Care  Patient       Patient will benefit from skilled therapeutic intervention in order to improve the following deficits and impairments:   Body Structure / Function / Physical Skills: ADL, FMC, ROM, IADL, Pain, Coordination       Visit Diagnosis: Muscle weakness (generalized)  Other lack of coordination    Problem List Patient Active Problem List   Diagnosis Date Noted  . Acute CVA (cerebrovascular accident) ([place]) 05/02/2016  . Left-sided weakness 05/01/2016    Harrel Carina, MS, OTR/L 03/17/2019, 3:47 PM  [place] MAIN [facility] SERVICES [address] [place], [place], [address] Phone: [phone]   Fax:  [phone]  Name: Curtis Powell MRN: [id number] Date of Birth: 02/28/1966

## 2019-03-22 ENCOUNTER — Ambulatory Visit: Payer: Medicare HMO | Admitting: Occupational Therapy

## 2019-03-22 ENCOUNTER — Ambulatory Visit: Payer: Medicare HMO

## 2019-03-23 DIAGNOSIS — R471 Dysarthria and anarthria: Secondary | ICD-10-CM | POA: Insufficient documentation

## 2019-03-23 DIAGNOSIS — F819 Developmental disorder of scholastic skills, unspecified: Secondary | ICD-10-CM | POA: Insufficient documentation

## 2019-03-23 DIAGNOSIS — K5900 Constipation, unspecified: Secondary | ICD-10-CM | POA: Insufficient documentation

## 2019-03-23 DIAGNOSIS — R1310 Dysphagia, unspecified: Secondary | ICD-10-CM | POA: Insufficient documentation

## 2019-03-23 DIAGNOSIS — M6281 Muscle weakness (generalized): Secondary | ICD-10-CM | POA: Insufficient documentation

## 2019-03-23 DIAGNOSIS — G894 Chronic pain syndrome: Secondary | ICD-10-CM | POA: Insufficient documentation

## 2019-03-23 DIAGNOSIS — E559 Vitamin D deficiency, unspecified: Secondary | ICD-10-CM | POA: Insufficient documentation

## 2019-03-24 ENCOUNTER — Ambulatory Visit: Payer: Medicare HMO | Admitting: Occupational Therapy

## 2019-03-24 ENCOUNTER — Ambulatory Visit: Payer: Medicare HMO

## 2019-03-29 ENCOUNTER — Other Ambulatory Visit: Payer: Self-pay

## 2019-03-29 ENCOUNTER — Ambulatory Visit: Payer: Medicare HMO

## 2019-03-29 ENCOUNTER — Encounter: Payer: Self-pay | Admitting: Occupational Therapy

## 2019-03-29 ENCOUNTER — Ambulatory Visit: Payer: Medicare HMO | Admitting: Occupational Therapy

## 2019-03-29 DIAGNOSIS — R2689 Other abnormalities of gait and mobility: Secondary | ICD-10-CM

## 2019-03-29 DIAGNOSIS — M6281 Muscle weakness (generalized): Secondary | ICD-10-CM

## 2019-03-29 DIAGNOSIS — R278 Other lack of coordination: Secondary | ICD-10-CM

## 2019-03-29 DIAGNOSIS — I69351 Hemiplegia and hemiparesis following cerebral infarction affecting right dominant side: Secondary | ICD-10-CM

## 2019-03-29 NOTE — Therapy (Signed)
[place] MAIN [facility] SERVICES [address] [place], [place], [address] Phone: [phone]   Fax:  [phone]  Occupational Therapy Treatment  Patient Details  Name: Curtis Powell MRN: [id number] Date of Birth: [birth year] No data recorded  Encounter Date: 03/29/2019  OT End of Session - 03/29/19 1520    Visit Number  155    Number of Visits  185    Date for OT Re-Evaluation  05/05/19    Authorization Type  Progress reporting period starting 12/09/2018    OT Start Time  1518    OT Stop Time  1600    OT Time Calculation (min)  42 min    Equipment Utilized During Treatment  Long handled shoe horn    Activity Tolerance  Patient tolerated treatment well    Behavior During Therapy  WFL for tasks assessed/performed       Past Medical History:  Diagnosis Date  . GERD (gastroesophageal reflux disease)   . Hyperlipidemia   . Hypertension   . Obesity   . Stroke [facility])     Past Surgical History:  Procedure Laterality Date  . BRAIN SURGERY      There were no vitals filed for this visit.  Subjective Assessment - 03/29/19 1519    Subjective   Pt. reports that his left hand feels better today.    Pertinent History  Pt. is a 53 y.o. male who suffered a CVA on 05/01/2016. Pt. was admitted to the hospital. Once discharged, he received Home Health PT and OT services for about a month. Pt. has had multiple CVAs over the past 8 years, and has had multiple falls in the past 6 months. Pt. resides in an apartment. Pt. has caregivers for 80 hours. Pt.'s mother stays with pt. at night, and assists with IADL tasks.      Patient Stated Goals  To be able to throw a ball and dribble a ball, do as much as I can for myself.     Currently in Pain?  No/denies      OT TREATMENT    Neuro muscular re-education:  Pt. worked on reaching using the shape tower. Pt. grasped and moved the shapes through 4 vertical dowels of varying heights. Pt. required position  changes when reaching. Pt. is improving with compensation proximally. Pt. worked on [facility] skills grasping 1" sticks, 1/4" collars, and 1/4" washers. Pt. worked on storing the objects in the palm, and translatory skills moving the items from the palm of the hand to the tip of the 2nd digit, and thumb. Pt. worked on removing the pegs using bilateral alternating hand patterns.  Selfcare:  Pt. required minA to donn his jacket.  Response to Treatment  Pt. has a new AFO on the right foot. Pt. continues to present with limited BUE strength, and [facility] skills. Pt. continues to present with limited BUE strength, limited UE functional reaching, and limited [facility] skills. Pt. presented with less compensation proximally during these tasks today. Pt. continues to work on these skills in order to improve [facility] skills, maximize independence with ADLs, and decrease caregiver burden.                       OT Education - 03/29/19 1520    Education provided  Yes    Education Details  reaching    [name]) Educated  Patient    Methods  Explanation;Demonstration    Comprehension  Verbalized understanding;Returned demonstration;Verbal cues required  OT Short Term Goals - 03/03/17 1459      OT SHORT TERM GOAL #1   Title  `        OT Long Term Goals - 03/01/19 1532      OT LONG TERM GOAL #2   Title  Pt. will complete self grooming with minA      OT LONG TERM GOAL #4   Title  Pt. will perform self dressing with minA and A/E as needed.    Baseline  requires assist with socks at times, diff with reaching to get pants over feet.  Able to get jacket on now and shirt with modified independence.    Time  12    Period  Weeks    Status  Partially Met    Target Date  05/05/19      OT LONG TERM GOAL #6   Title  Pt. will write his name efficiently with 100% legibility    Baseline  Pt continues to present with limited legibility and efficiency when writing name to sign in when riding the [place]     Time  12    Period  Weeks    Status  On-going    Target Date  05/05/19      OT LONG TERM GOAL #7   Title  Pt will independently and consistently follow HEP to increase UE strength to increase functional independence    Baseline  Pt to continues to work towards independence with exercises    Time  12    Period  Weeks    Status  On-going    Target Date  05/05/19      OT LONG TERM GOAL #8   Title  Pt. will require supervision ironing a shirt.    Baseline  Pt. requires CGA-minA, and complete set-up seated using a tabletop iron    Time  12    Period  Weeks    Status  On-going    Target Date  05/05/19      OT LONG TERM GOAL  #9   Baseline  Pt. will independently be able to sew a button onto a shirt.    Time  12    Period  Weeks    Status  On-going    Target Date  05/05/19      OT LONG TERM GOAL  #10   TITLE  Pt. will independently be able to throw a ball    Baseline  Pt. continues to be limited    Time  12    Period  Weeks    Status  On-going    Target Date  05/05/19      OT LONG TERM GOAL  #11   TITLE  Pt. will improve UE functional reaching to be able to independently use his RUE to hand his clothes in the closest.    Baseline  Pt. continues to have difficulty.    Time  12    Period  Weeks    Status  On-going    Target Date  05/05/19            Plan - 03/29/19 1520    Clinical Impression Statement Pt. has a new AFO on the right foot. Pt. continues to present with limited BUE strength, and [facility] skills. Pt. continues to present with limited BUE strength, limited UE functional reaching, and limited [facility] skills. Pt. presented with less compensation proximally during these tasks today. Pt. continues to work on these skills in order  to improve [facility] skills, maximize independence with ADLs, and decrease caregiver burden.    OT Occupational Profile and History  Problem Focused Assessment - Including review of records relating to presenting problem    Occupational performance  deficits (Please refer to evaluation for details):  ADL's;IADL's    Body Structure / Function / Physical Skills  ADL;FMC;ROM;IADL;Pain;Coordination    Rehab Potential  Fair    Clinical Decision Making  Several treatment options, min-mod task modification necessary    OT Frequency  2x / week    OT Duration  12 weeks    OT Treatment/Interventions  Self-care/ADL training;Patient/family education;DME and/or AE instruction;Therapeutic exercise;Moist Heat;Neuromuscular education    Consulted and Agree with Plan of Care  Patient       Patient will benefit from skilled therapeutic intervention in order to improve the following deficits and impairments:   Body Structure / Function / Physical Skills: ADL, FMC, ROM, IADL, Pain, Coordination       Visit Diagnosis: Muscle weakness (generalized)  Other lack of coordination    Problem List Patient Active Problem List   Diagnosis Date Noted  . Acute CVA (cerebrovascular accident) ([place]) 05/02/2016  . Left-sided weakness 05/01/2016    Harrel Carina, MS, OTR/L 03/29/2019, 3:33 PM  [place] MAIN [facility] SERVICES [address] [place], [place], [address] Phone: [phone]   Fax:  [phone]  Name: GIANNIS CORPUZ MRN: [id number] Date of Birth: 20-Jul-1965

## 2019-03-29 NOTE — Therapy (Signed)
[place] MAIN [facility] SERVICES [address] [place], [place], [address] Phone: [phone]   Fax:  [phone]  Physical Therapy Treatment/ Physical Therapy Progress Note   Dates of reporting period  02/10/19  to   03/29/19  Patient Details  Name: Curtis Powell MRN: [id number] Date of Birth: 25-Apr-1965 No data recorded  Encounter Date: 03/29/2019  PT End of Session - 03/29/19 1423    Visit Number  160    Number of Visits  166    Date for PT Re-Evaluation  04/08/19    Authorization Type  10    Authorization Time Period  authorized 11/16/18-05/19/19    PT Start Time  1429    PT Stop Time  1514    PT Time Calculation (min)  45 min    Equipment Utilized During Treatment  Gait belt    Activity Tolerance  Patient tolerated treatment well;Patient limited by fatigue    Behavior During Therapy  WFL for tasks assessed/performed       Past Medical History:  Diagnosis Date  . GERD (gastroesophageal reflux disease)   . Hyperlipidemia   . Hypertension   . Obesity   . Stroke [facility])     Past Surgical History:  Procedure Laterality Date  . BRAIN SURGERY      There were no vitals filed for this visit.  Subjective Assessment - 03/29/19 1457    Subjective  Patient has not been seen in 2 weeks due to illness and transportation issues. Patient has received carbon fiber AFO for RLE. No falls or LOB since last session.    Pertinent History   Patient is a pleasant 53 year old male who presents to physical therapy for weakness and immobility secondary to CVA.  Had a stroke in Feb 2018. He was previously fully independent, but this stroke caused severe residual deficits, mainly on the right side as well as speech, and now he is unable to walk or perform most of his ADLs on his own. Entire right side is very weak. He still has a little difficulty with speech but his swallowing is improved to baseline. His mom had to move in with him and now is his main  caregiver.     How long can you sit comfortably?  5 minutes    How long can you stand comfortably?  5 minutes    How long can you walk comfortably?  10 ft    Patient Stated Goals  Walk without walker, walk further with walker, get some strength in back     Currently in Pain?  No/denies    Pain Onset  1 to 4 weeks ago        Patient has not been seen in 2 weeks due to illness and transportation issues. Patient has received carbon fiber AFO for RLE.    10 MWT: 53 seconds with AFO and RW  5x STS: 16 seconds with BUE support Walk 150 ft: ambulate 100 ft with HR elevation to 124  BERG: deferred due to elevated HR.  Static stand 32 seconds without support Strength LEFS  Treat:  Seated with 5lb ankle weight: -arms crossed marching with upright posture 20x each LE, x2 trials each  -LAQ with focus on controlled eccentric portion. 15x each LE Seated BTB: -hip abduction 20x, focus on equal muscle control.  -BTB hamstring curl 12x each LE  Modified seated windmills 10x   HR elevated throughout session requiring seated rest breaks to reduce into  therapeutic range. Education on purse lipped breathing for reduction of HR.    Patient's condition has the potential to improve in response to therapy. Maximum improvement is yet to be obtained. The anticipated improvement is attainable and reasonable in a generally predictable time.  Patient reports he is practicing his walking and balance at home but is still a challenge.               PT Education - 03/29/19 1423    Education provided  Yes    Education Details  goals, exercise technique, body mechanics    Person(s) Educated  Patient    Methods  Explanation;Demonstration;Tactile cues;Verbal cues    Comprehension  Verbalized understanding;Returned demonstration;Verbal cues required;Tactile cues required       PT Short Term Goals - 02/11/19 1404      PT SHORT TERM GOAL #1   Title  Patient will be independent in home exercise  program to improve strength/mobility for better functional independence with ADLs.    Baseline  hep compliant    Time  2    Period  Weeks    Status  Achieved      PT SHORT TERM GOAL #2   Title  Patient will require min cueing for STS transfer with CGA for increased independence with mobility.     Baseline  CGA    Time  2    Period  Weeks    Status  Achieved      PT SHORT TERM GOAL #3   Title  Patient will maintain upright posture for > 15 seconds to demonstrate strengthened postural control muscualture     Baseline  18 seconds     Time  2    Period  Weeks    Status  Achieved        PT Long Term Goals - 02/11/19 0001      PT LONG TERM GOAL #1   Title  Patient will perform 10 MWT in < 1 minute to demonstrate improved ambulatory velocity and capacity.     Baseline  02/11/19: 56 seconds with AFO 4/10: 2 minutes 45 seconds; 5/29: 2 minute 31 seconds; 3 minutes 22 seconds 8/22: 3 minutes 2 seconds  9/12:  2 minutes and 43 seconds 10/15: 41mnute 12 seconds 11/7: 1 minute 21 seconds 12/19: 1 min 16 seconds 3/3: 57 seconds; 08/26/18: 1:41.4s= 0.10 m/s 7/1: 1 minute 14 seconds 9/14: 61 seconds    Time  8    Period  Weeks    Status  Achieved      PT LONG TERM GOAL #2   Title  Patient (< 675years old) will complete five times sit to stand test in < 10 seconds indicating an increased LE strength and improved balance.    Baseline  02/11/19: 36 seconds 5/19: 20.6s from [place] one hand on walker and one on WC, 80% upright posture 7/1: 22 seconds one hand on walker one hand on w/c  9/14: 53 seconds with full upright posture    Time  8    Period  Weeks    Status  Partially Met    Target Date  04/08/19      PT LONG TERM GOAL #3   Title  Patient will increase Berg Balance score by > 6 points (26/56) to demonstrate decreased fall risk during functional activities.    Baseline  9/12: 20/56 10/15: 21/56 11/7: 20/56 12/19: 22/56; 08/26/18: 11/56 7/1: 17/56  9/14: 23/56    Time  8  Period  Weeks     Status  Partially Met    Target Date  04/08/19      PT LONG TERM GOAL #4   Title  Patient will increase BLE gross strength to 4+/5 as to improve functional strength for independent gait, increased standing tolerance and increased ADL ability.    Baseline   02/11/19: hip flexion 4/5 hip extension and abduction 2+/54-/5 RLE, 10/15: 4/5 RLE 11/7: 4/5 RLE 3/3: 4/5 7/1:  hip flexion 4/5 hip extension 2/5 hip abduction/adduciton 2/5     Time  8    Period  Weeks    Status  Partially Met    Target Date  04/08/19      PT LONG TERM GOAL #5   Title  Patient will increase lower extremity functional scale to >40/80 to demonstrate improved functional mobility and increased tolerance with ADLs.     Baseline  02/10/19: 34/56 14/80; 12/26: 21/80; 2/18: 28/80  4/10; 25/80 5/29: 27/80; 7/16: 29/80 8/22: 30/80  9/12: 26/80 (feeling more tired due to moving) 10/15: 37/80 11/7: 23/80 3/3: 32/80; 08/25/18: 28/80 9/15: 32/56    Time  8    Period  Weeks    Status  Partially Met    Target Date  04/08/19      PT LONG TERM GOAL #6   Title  Patient will ambulate 150 ft with least assistive device and Supervision with no breaks to allow for increased mobility within home.    Baseline  11/4: 105 ft with AFO after 10 MWT 11/7: 157f 12/19: 115 ft  3/3: will assess next session, has done 200 ft with one rest break; 08/26/18: Pt fatigued after 30' today 9/14: 100 ft with CGA    Time  8    Period  Weeks    Status  Partially Met    Target Date  04/08/19      PT LONG TERM GOAL #7   Title  Patient will perform 10 MWT in <30 seconds for improved gait speed, gait mechanics, and functional capacity for mobility.     Baseline  11/4: 56 seconds with AFO and RW     Time  8    Period  Weeks    Status  New    Target Date  04/08/19            Plan - 03/29/19 1500    Clinical Impression Statement  Patient returning to PT for first time in two weeks due to a combination of transportation and not feeling well. Patient has  improved his 10 MWT by 3 seconds however was unable to fully perform BERG test due to elevated HR with balance interventions. Ambulatory duration limited by HR elevation as well.  Patient's condition has the potential to improve in response to therapy. Maximum improvement is yet to be obtained. The anticipated improvement is attainable and reasonable in a generally predictable time. He will continue to benefit from skilled PT services to address strength, balance and mobility deficits in order to improve function at home    Rehab Potential  Fair    Clinical Impairments Affecting Rehab Potential  hx of HTN, HLD, CVA, learning disability, Diabetes, brain tumor,     PT Frequency  2x / week    PT Duration  8 weeks    PT Treatment/Interventions  ADLs/Self Care Home Management;Aquatic Therapy;Ultrasound;Moist Heat;Traction;DME Instruction;Gait training;Stair training;Functional mobility training;Therapeutic activities;Therapeutic exercise;Orthotic Fit/Training;Neuromuscular re-education;Balance training;Patient/family education;Manual techniques;Wheelchair mobility training;Passive range of motion;Energy conservation;Taping;Visual/perceptual remediation/compensation  PT Next Visit Plan  balance    PT Home Exercise Plan  TrA contraction, glute sets: needs updates. Potentially some time in supine flat in hospital bed, to improve hip extension deficits.     Consulted and Agree with Plan of Care  Patient    Family Member Consulted  mother       Patient will benefit from skilled therapeutic intervention in order to improve the following deficits and impairments:  Abnormal gait, Decreased activity tolerance, Decreased balance, Decreased knowledge of precautions, Decreased endurance, Decreased coordination, Decreased knowledge of use of DME, Decreased mobility, Decreased range of motion, Difficulty walking, Decreased safety awareness, Decreased strength, Impaired flexibility, Impaired perceived functional ability,  Impaired tone, Postural dysfunction, Improper body mechanics, Pain  Visit Diagnosis: Muscle weakness (generalized)  Other lack of coordination  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 Acute CVA (cerebrovascular accident) ([place]) 05/02/2016  . Left-sided weakness 05/01/2016   Janna Arch, PT, DPT   03/29/2019, 3:16 PM  [place] MAIN [facility] SERVICES [address] [place], [place], [address] Phone: [phone]   Fax:  [phone]  Name: Curtis Powell MRN: [id number] Date of Birth: [birth year]

## 2019-03-31 ENCOUNTER — Encounter: Payer: Self-pay | Admitting: Occupational Therapy

## 2019-03-31 ENCOUNTER — Other Ambulatory Visit: Payer: Self-pay

## 2019-03-31 ENCOUNTER — Ambulatory Visit: Payer: Medicare HMO

## 2019-03-31 ENCOUNTER — Ambulatory Visit: Payer: Medicare HMO | Admitting: Occupational Therapy

## 2019-03-31 DIAGNOSIS — R278 Other lack of coordination: Secondary | ICD-10-CM

## 2019-03-31 DIAGNOSIS — I69351 Hemiplegia and hemiparesis following cerebral infarction affecting right dominant side: Secondary | ICD-10-CM

## 2019-03-31 DIAGNOSIS — R2689 Other abnormalities of gait and mobility: Secondary | ICD-10-CM

## 2019-03-31 DIAGNOSIS — M6281 Muscle weakness (generalized): Secondary | ICD-10-CM | POA: Diagnosis not present

## 2019-03-31 NOTE — Therapy (Signed)
[place] MAIN [facility] SERVICES [address] [place], [place], [address] Phone: [phone]   Fax:  [phone]  Physical Therapy Treatment  Patient Details  Name: Curtis Powell MRN: [id number] Date of Birth: 1966-01-28 No data recorded  Encounter Date: 03/31/2019  PT End of Session - 03/31/19 1446    Visit Number  161    Number of Visits  166    Date for PT Re-Evaluation  04/08/19    Authorization Time Period  authorized 11/16/18-05/19/19    PT Start Time  5883    PT Stop Time  1513    PT Time Calculation (min)  44 min    Equipment Utilized During Treatment  Gait belt    Activity Tolerance  Patient tolerated treatment well;Patient limited by fatigue    Behavior During Therapy  WFL for tasks assessed/performed       Past Medical History:  Diagnosis Date  . GERD (gastroesophageal reflux disease)   . Hyperlipidemia   . Hypertension   . Obesity   . Stroke [facility])     Past Surgical History:  Procedure Laterality Date  . BRAIN SURGERY      There were no vitals filed for this visit.  Subjective Assessment - 03/31/19 1433    Subjective  Patient reports no pain today, is wearing his AFO today. No falls or LOB since last session. Has been walking.    Pertinent History   Patient is a pleasant 53 year old male who presents to physical therapy for weakness and immobility secondary to CVA.  Had a stroke in Feb 2018. He was previously fully independent, but this stroke caused severe residual deficits, mainly on the right side as well as speech, and now he is unable to walk or perform most of his ADLs on his own. Entire right side is very weak. He still has a little difficulty with speech but his swallowing is improved to baseline. His mom had to move in with him and now is his main caregiver.     How long can you sit comfortably?  5 minutes    How long can you stand comfortably?  5 minutes    How long can you walk comfortably?  10 ft    Patient  Stated Goals  Walk without walker, walk further with walker, get some strength in back     Currently in Pain?  No/denies         [facility] PT Assessment - 03/31/19 0001      Berg Balance Test   Sit to Stand  Able to stand  independently using hands    Standing Unsupported  Able to stand 2 minutes with supervision    Sitting with Back Unsupported but Feet Supported on Floor or Stool  Able to sit safely and securely 2 minutes    Stand to Sit  Controls descent by using hands    Transfers  Able to transfer safely, definite need of hands    Standing Unsupported with Eyes Closed  Able to stand 3 seconds    Standing Unsupported with Feet Together  Needs help to attain position but able to stand for 30 seconds with feet together    From Standing, Reach Forward with Outstretched Arm  Reaches forward but needs supervision    From Standing Position, Pick up Object from Floor  Unable to pick up and needs supervision    From Standing Position, Turn to Look Behind Over each Shoulder  Turn sideways  only but maintains balance    Turn 360 Degrees  Needs assistance while turning    Standing Unsupported, Alternately Place Feet on Step/Stool  Able to complete >2 steps/needs minimal assist    Standing Unsupported, One Foot in Front  Needs help to step but can hold 15 seconds    Standing on One Leg  Unable to try or needs assist to prevent fall    Total Score  25       [place] BERG: 25/56    in // bars:  Static stand no UE support 2 minutes and 10 seconds.   BUE support 6 " step toe taps 15x each side, Cueing for upright posture, HR elevated to 121 afterwards.   BUE support: 6" step lateral toe taps , 15x each leg, seated rest break between each LE due to elevated HR, cueing for hip/knee flexion for clearance.   Standing hip extension with BUE support, focus on upright posture 12x each LE  Balloon taps reaching inside/outside BOS with SUE support and occasional Min/Mod A to maintain stability x 2 minutes.     seated Clams with BTB resistance x 20, focus on slow muscle activation/recruitment Marches with BTB around knees 20x with upright posture.   Hamstring curls BTB resistance 12x each LE, cueing for full arc of movement  Seated adduction ball squeeze 10x 3 second holds  Seated TrA activation 10x 3 second holds  HR monitored throughout session with occasional rests required for return to therapeutic range.      Patient continues to progress with functional stability, performing session in // bars today. Increased duration of standing without UE support performed with patient tolerating dynamic stability interventions. He continues to require BUE support for lifting of LE's at this time. He will continue to benefit from skilled PT services to address strength, balance and mobility deficits in order to improve function at home.                         PT Education - 03/31/19 1446    Education provided  Yes    Education Details  exercise technique, stability    Person(s) Educated  Patient    Methods  Explanation;Demonstration;Tactile cues;Verbal cues    Comprehension  Verbalized understanding;Returned demonstration;Verbal cues required;Tactile cues required       PT Short Term Goals - 02/11/19 1404      PT SHORT TERM GOAL #1   Title  Patient will be independent in home exercise program to improve strength/mobility for better functional independence with ADLs.    Baseline  hep compliant    Time  2    Period  Weeks    Status  Achieved      PT SHORT TERM GOAL #2   Title  Patient will require min cueing for STS transfer with CGA for increased independence with mobility.     Baseline  CGA    Time  2    Period  Weeks    Status  Achieved      PT SHORT TERM GOAL #3   Title  Patient will maintain upright posture for > 15 seconds to demonstrate strengthened postural control muscualture     Baseline  18 seconds     Time  2    Period  Weeks    Status  Achieved         PT Long Term Goals - 02/11/19 0001      PT LONG TERM GOAL #  1   Title  Patient will perform 10 MWT in < 1 minute to demonstrate improved ambulatory velocity and capacity.     Baseline  02/11/19: 56 seconds with AFO 4/10: 2 minutes 45 seconds; 5/29: 2 minute 31 seconds; 3 minutes 22 seconds 8/22: 3 minutes 2 seconds  9/12:  2 minutes and 43 seconds 10/15: 4mnute 12 seconds 11/7: 1 minute 21 seconds 12/19: 1 min 16 seconds 3/3: 57 seconds; 08/26/18: 1:41.4s= 0.10 m/s 7/1: 1 minute 14 seconds 9/14: 61 seconds    Time  8    Period  Weeks    Status  Achieved      PT LONG TERM GOAL #2   Title  Patient (< 619years old) will complete five times sit to stand test in < 10 seconds indicating an increased LE strength and improved balance.    Baseline  02/11/19: 36 seconds 5/19: 20.6s from [place] one hand on walker and one on WC, 80% upright posture 7/1: 22 seconds one hand on walker one hand on w/c  9/14: 53 seconds with full upright posture    Time  8    Period  Weeks    Status  Partially Met    Target Date  04/08/19      PT LONG TERM GOAL #3   Title  Patient will increase Berg Balance score by > 6 points (26/56) to demonstrate decreased fall risk during functional activities.    Baseline  9/12: 20/56 10/15: 21/56 11/7: 20/56 12/19: 22/56; 08/26/18: 11/56 7/1: 17/56  9/14: 23/56    Time  8    Period  Weeks    Status  Partially Met    Target Date  04/08/19      PT LONG TERM GOAL #4   Title  Patient will increase BLE gross strength to 4+/5 as to improve functional strength for independent gait, increased standing tolerance and increased ADL ability.    Baseline   02/11/19: hip flexion 4/5 hip extension and abduction 2+/54-/5 RLE, 10/15: 4/5 RLE 11/7: 4/5 RLE 3/3: 4/5 7/1:  hip flexion 4/5 hip extension 2/5 hip abduction/adduciton 2/5     Time  8    Period  Weeks    Status  Partially Met    Target Date  04/08/19      PT LONG TERM GOAL #5   Title  Patient will increase lower extremity functional  scale to >40/80 to demonstrate improved functional mobility and increased tolerance with ADLs.     Baseline  02/10/19: 34/56 14/80; 12/26: 21/80; 2/18: 28/80  4/10; 25/80 5/29: 27/80; 7/16: 29/80 8/22: 30/80  9/12: 26/80 (feeling more tired due to moving) 10/15: 37/80 11/7: 23/80 3/3: 32/80; 08/25/18: 28/80 9/15: 32/56    Time  8    Period  Weeks    Status  Partially Met    Target Date  04/08/19      PT LONG TERM GOAL #6   Title  Patient will ambulate 150 ft with least assistive device and Supervision with no breaks to allow for increased mobility within home.    Baseline  11/4: 105 ft with AFO after 10 MWT 11/7: [id number]/19: 115 ft  3/3: will assess next session, has done 200 ft with one rest break; 08/26/18: Pt fatigued after 30' today 9/14: 100 ft with CGA    Time  8    Period  Weeks    Status  Partially Met    Target Date  04/08/19  PT LONG TERM GOAL #7   Title  Patient will perform 10 MWT in <30 seconds for improved gait speed, gait mechanics, and functional capacity for mobility.     Baseline  11/4: 56 seconds with AFO and RW     Time  8    Period  Weeks    Status  New    Target Date  04/08/19            Plan - 03/31/19 1451    Clinical Impression Statement  Patient continues to progress with functional stability, performing session in // bars today. Increased duration of standing without UE support performed with patient tolerating dynamic stability interventions. He continues to require BUE support for lifting of LE's at this time. He will continue to benefit from skilled PT services to address strength, balance and mobility deficits in order to improve function at home.    Rehab Potential  Fair    Clinical Impairments Affecting Rehab Potential  hx of HTN, HLD, CVA, learning disability, Diabetes, brain tumor,     PT Frequency  2x / week    PT Duration  8 weeks    PT Treatment/Interventions  ADLs/Self Care Home Management;Aquatic Therapy;Ultrasound;Moist Heat;Traction;DME  Instruction;Gait training;Stair training;Functional mobility training;Therapeutic activities;Therapeutic exercise;Orthotic Fit/Training;Neuromuscular re-education;Balance training;Patient/family education;Manual techniques;Wheelchair mobility training;Passive range of motion;Energy conservation;Taping;Visual/perceptual remediation/compensation    PT Next Visit Plan  balance    PT Home Exercise Plan  TrA contraction, glute sets: needs updates. Potentially some time in supine flat in hospital bed, to improve hip extension deficits.     Consulted and Agree with Plan of Care  Patient    Family Member Consulted  mother       Patient will benefit from skilled therapeutic intervention in order to improve the following deficits and impairments:  Abnormal gait, Decreased activity tolerance, Decreased balance, Decreased knowledge of precautions, Decreased endurance, Decreased coordination, Decreased knowledge of use of DME, Decreased mobility, Decreased range of motion, Difficulty walking, Decreased safety awareness, Decreased strength, Impaired flexibility, Impaired perceived functional ability, Impaired tone, Postural dysfunction, Improper body mechanics, Pain  Visit Diagnosis: Muscle weakness (generalized)  Other lack of coordination  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 Acute CVA (cerebrovascular accident) ([place]) 05/02/2016  . Left-sided weakness 05/01/2016   Janna Arch, PT, DPT   03/31/2019, 3:14 PM  [place] MAIN [facility] SERVICES [address] [place], [place], [address] Phone: [phone]   Fax:  [phone]  Name: SAYVON ARTERBERRY MRN: [id number] Date of Birth: 1965/04/12

## 2019-03-31 NOTE — Therapy (Signed)
[place] MAIN [facility] SERVICES [address] [place], [place], [address] Phone: [phone]   Fax:  [phone]  Occupational Therapy Treatment  Patient Details  Name: Curtis Powell MRN: [id number] Date of Birth: 06-13-1965 No data recorded  Encounter Date: 03/31/2019  OT End of Session - 03/31/19 1525    Visit Number  [id number]    Number of Visits  185    Date for OT Re-Evaluation  05/05/19    Authorization Type  Progress reporting period starting 12/09/2018    OT Start Time  1517    OT Stop Time  1600    OT Time Calculation (min)  43 min    Equipment Utilized During Treatment  Long handled shoe horn    Activity Tolerance  Patient tolerated treatment well    Behavior During Therapy  WFL for tasks assessed/performed       Past Medical History:  Diagnosis Date  . GERD (gastroesophageal reflux disease)   . Hyperlipidemia   . Hypertension   . Obesity   . Stroke [facility])     Past Surgical History:  Procedure Laterality Date  . BRAIN SURGERY      There were no vitals filed for this visit.  Subjective Assessment - 03/31/19 1519    Subjective   Pt. reports pain in his left 3rd digit, and thumb    Patient is accompanied by:  Family member    Pertinent History  Pt. is a 53 y.o. male who suffered a CVA on 05/01/2016. Pt. was admitted to the hospital. Once discharged, he received Home Health PT and OT services for about a month. Pt. has had multiple CVAs over the past 8 years, and has had multiple falls in the past 6 months. Pt. resides in an apartment. Pt. has caregivers for 80 hours. Pt.'s mother stays with pt. at night, and assists with IADL tasks.      Patient Stated Goals  To be able to throw a ball and dribble a ball, do as much as I can for myself.     Currently in Pain?  Yes    Pain Score  5     Pain Location  Hand    Pain Orientation  Left    Pain Descriptors / Indicators  Aching      OT TREATMENT    Neuro muscular  re-education:  Pt. worked on using his right hand for grasping, and manipulating 1/2", 3/4", and 1" washers from a magnetic dish using point grasp pattern. Pt. worked on reaching up, stabilizing, and sustaining shoulder elevation while placing the washer over a small precise target on vertical dowels positioned at various angles. Pt. Worked on using his left hand for storage, and translatory movements of hand with reaching. Pt. Performed right hand [facility] tasks using the Grooved pegboard. Pt. worked on grasping the grooved pegs from a horizontal position, and moving the pegs to a vertical position in the hand to prepare for placing them in the grooved slot.   Selfcare:  Pt. required minA to donn his jacket.  Response to Treatment:  Pt. continues to work on improving UE strength, and [facility] skills. Pt. continues to incorporate his UEs more during tasks at home. Pt. dropped multiple washers when attempting to place them onto vertical, and horizontal dowels. Pt. required cues secondary to compensating proximally with leaning to the left, and hiking the right shoulder. Pt. Continues to work on improving UE functioning in order to increase engagement in ADLs,  IADLs, and maximize independence.                        OT Education - 03/31/19 1525    Education provided  Yes    Education Details  reaching    [name]) Educated  Patient    Methods  Explanation;Demonstration    Comprehension  Verbalized understanding;Returned demonstration;Verbal cues required       OT Short Term Goals - 03/03/17 1459      OT SHORT TERM GOAL #1   Title  `        OT Long Term Goals - 03/01/19 1532      OT LONG TERM GOAL #2   Title  Pt. will complete self grooming with minA      OT LONG TERM GOAL #4   Title  Pt. will perform self dressing with minA and A/E as needed.    Baseline  requires assist with socks at times, diff with reaching to get pants over feet.  Able to get jacket on now and shirt  with modified independence.    Time  12    Period  Weeks    Status  Partially Met    Target Date  05/05/19      OT LONG TERM GOAL #6   Title  Pt. will write his name efficiently with 100% legibility    Baseline  Pt continues to present with limited legibility and efficiency when writing name to sign in when riding the [place]    Time  12    Period  Weeks    Status  On-going    Target Date  05/05/19      OT LONG TERM GOAL #7   Title  Pt will independently and consistently follow HEP to increase UE strength to increase functional independence    Baseline  Pt to continues to work towards independence with exercises    Time  12    Period  Weeks    Status  On-going    Target Date  05/05/19      OT LONG TERM GOAL #8   Title  Pt. will require supervision ironing a shirt.    Baseline  Pt. requires CGA-minA, and complete set-up seated using a tabletop iron    Time  12    Period  Weeks    Status  On-going    Target Date  05/05/19      OT LONG TERM GOAL  #9   Baseline  Pt. will independently be able to sew a button onto a shirt.    Time  12    Period  Weeks    Status  On-going    Target Date  05/05/19      OT LONG TERM GOAL  #10   TITLE  Pt. will independently be able to throw a ball    Baseline  Pt. continues to be limited    Time  12    Period  Weeks    Status  On-going    Target Date  05/05/19      OT LONG TERM GOAL  #11   TITLE  Pt. will improve UE functional reaching to be able to independently use his RUE to hand his clothes in the closest.    Baseline  Pt. continues to have difficulty.    Time  12    Period  Weeks    Status  On-going    Target Date  05/05/19  Plan - 03/31/19 1526    Clinical Impression Statement Pt. continues to work on improving UE strength, and [facility] skills. Pt. continues to incorporate his UEs more during tasks at home. Pt. dropped multiple washers when attempting to place them onto vertical, and horizontal dowels. Pt. required cues  secondary to compensating proximally with leaning to the left, and hiking the right shoulder. Pt. Continues to work on improving UE functioning in order to increase engagement in ADLs, IADLs, and maximize independence.   OT Occupational Profile and History  Problem Focused Assessment - Including review of records relating to presenting problem    Occupational performance deficits (Please refer to evaluation for details):  ADL's;IADL's    Body Structure / Function / Physical Skills  ADL;FMC;ROM;IADL;Pain;Coordination    Rehab Potential  Fair    Clinical Decision Making  Several treatment options, min-mod task modification necessary    OT Frequency  2x / week    OT Duration  12 weeks    OT Treatment/Interventions  Self-care/ADL training;Patient/family education;DME and/or AE instruction;Therapeutic exercise;Moist Heat;Neuromuscular education    Consulted and Agree with Plan of Care  Patient       Patient will benefit from skilled therapeutic intervention in order to improve the following deficits and impairments:   Body Structure / Function / Physical Skills: ADL, FMC, ROM, IADL, Pain, Coordination       Visit Diagnosis: Muscle weakness (generalized)  Other lack of coordination    Problem List Patient Active Problem List   Diagnosis Date Noted  . Acute CVA (cerebrovascular accident) ([place]) 05/02/2016  . Left-sided weakness 05/01/2016    Harrel Carina, MS, OTR/L 03/31/2019, 3:50 PM  [place] MAIN [facility] SERVICES [address] [place], [place], [address] Phone: [phone]   Fax:  [phone]  Name: Curtis Powell MRN: [id number] Date of Birth: 07-22-1965

## 2019-04-05 ENCOUNTER — Encounter: Payer: Self-pay | Admitting: Physical Therapy

## 2019-04-05 ENCOUNTER — Ambulatory Visit: Payer: Medicare HMO | Admitting: Occupational Therapy

## 2019-04-05 ENCOUNTER — Encounter: Payer: Self-pay | Admitting: Occupational Therapy

## 2019-04-05 ENCOUNTER — Ambulatory Visit: Payer: Medicare HMO | Admitting: Physical Therapy

## 2019-04-05 ENCOUNTER — Other Ambulatory Visit: Payer: Self-pay

## 2019-04-05 DIAGNOSIS — M6281 Muscle weakness (generalized): Secondary | ICD-10-CM | POA: Diagnosis not present

## 2019-04-05 DIAGNOSIS — I69351 Hemiplegia and hemiparesis following cerebral infarction affecting right dominant side: Secondary | ICD-10-CM

## 2019-04-05 DIAGNOSIS — R278 Other lack of coordination: Secondary | ICD-10-CM

## 2019-04-05 DIAGNOSIS — R2689 Other abnormalities of gait and mobility: Secondary | ICD-10-CM

## 2019-04-05 NOTE — Therapy (Signed)
[place] MAIN [facility] SERVICES [address] [place], [place], [address] Phone: [phone]   Fax:  [phone]  Occupational Therapy Treatment  Patient Details  Name: Curtis Powell MRN: [id number] Date of Birth: 13-Oct-1965 No data recorded  Encounter Date: 04/05/2019  OT End of Session - 04/05/19 1509    Visit Number  155    Number of Visits  185    Date for OT Re-Evaluation  05/05/19    Authorization Type  Progress reporting period starting 12/09/2018    OT Start Time  1435    OT Stop Time  1515    OT Time Calculation (min)  40 min    Activity Tolerance  Patient tolerated treatment well    Behavior During Therapy  [facility] for tasks assessed/performed       Past Medical History:  Diagnosis Date  . GERD (gastroesophageal reflux disease)   . Hyperlipidemia   . Hypertension   . Obesity   . Stroke [facility])     Past Surgical History:  Procedure Laterality Date  . BRAIN SURGERY      There were no vitals filed for this visit.  Subjective Assessment - 04/05/19 1500    Subjective   Pt. reports left 3rd digit pain.    Patient is accompanied by:  Family member    Pertinent History  Pt. is a 53 y.o. male who suffered a CVA on 05/01/2016. Pt. was admitted to the hospital. Once discharged, he received Home Health PT and OT services for about a month. Pt. has had multiple CVAs over the past 8 years, and has had multiple falls in the past 6 months. Pt. resides in an apartment. Pt. has caregivers for 80 hours. Pt.'s mother stays with pt. at night, and assists with IADL tasks.      Currently in Pain?  No/denies    Pain Score  5     Pain Location  Hand   3rd digit   Pain Orientation  Left      OT TREATMENT    Neuro muscular re-education:  Pt. worked on grasping, and manipulating 1", 3/4" 1/2" washers from a magnetic dish using point grasp pattern. Pt. worked on reaching up, stabilizing, and sustaining shoulder elevation while placing the washer  over a small precise target on vertical dowels positioned at various angles. Pt. worked on reaching using the [employer]. Pt. Grasped and moved the shapes through 3 vertical dowels of varying heights.  Selfcare:  Pt. performed toileting independently while standing at the commode. Pt. Requires assist to move the w/c over the threshold of the doorway, and position the w/c. Pt. Worked on UE dressing donning a jacket with min-modA.                          OT Education - 04/05/19 1501    Education provided  Yes    Education Details  reaching    [name]) Educated  Patient    Methods  Explanation;Demonstration    Comprehension  Verbalized understanding;Returned demonstration;Verbal cues required       OT Short Term Goals - 03/03/17 1459      OT SHORT TERM GOAL #1   Title  `        OT Long Term Goals - 03/01/19 1532      OT LONG TERM GOAL #2   Title  Pt. will complete self grooming with minA      OT  LONG TERM GOAL #4   Title  Pt. will perform self dressing with minA and A/E as needed.    Baseline  requires assist with socks at times, diff with reaching to get pants over feet.  Able to get jacket on now and shirt with modified independence.    Time  12    Period  Weeks    Status  Partially Met    Target Date  05/05/19      OT LONG TERM GOAL #6   Title  Pt. will write his name efficiently with 100% legibility    Baseline  Pt continues to present with limited legibility and efficiency when writing name to sign in when riding the [place]    Time  12    Period  Weeks    Status  On-going    Target Date  05/05/19      OT LONG TERM GOAL #7   Title  Pt will independently and consistently follow HEP to increase UE strength to increase functional independence    Baseline  Pt to continues to work towards independence with exercises    Time  12    Period  Weeks    Status  On-going    Target Date  05/05/19      OT LONG TERM GOAL #8   Title  Pt. will require  supervision ironing a shirt.    Baseline  Pt. requires CGA-minA, and complete set-up seated using a tabletop iron    Time  12    Period  Weeks    Status  On-going    Target Date  05/05/19      OT LONG TERM GOAL  #9   Baseline  Pt. will independently be able to sew a button onto a shirt.    Time  12    Period  Weeks    Status  On-going    Target Date  05/05/19      OT LONG TERM GOAL  #10   TITLE  Pt. will independently be able to throw a ball    Baseline  Pt. continues to be limited    Time  12    Period  Weeks    Status  On-going    Target Date  05/05/19      OT LONG TERM GOAL  #11   TITLE  Pt. will improve UE functional reaching to be able to independently use his RUE to hand his clothes in the closest.    Baseline  Pt. continues to have difficulty.    Time  12    Period  Weeks    Status  On-going    Target Date  05/05/19            Plan - 04/05/19 1509    Consulted and Agree with Plan of Care  Patient       Patient will benefit from skilled therapeutic intervention in order to improve the following deficits and impairments:   Body Structure / Function / Physical Skills: ADL, FMC, ROM, IADL, Pain, Coordination       Visit Diagnosis: Muscle weakness (generalized)  Other lack of coordination    Problem List Patient Active Problem List   Diagnosis Date Noted  . Acute CVA (cerebrovascular accident) ([place]) 05/02/2016  . Left-sided weakness 05/01/2016    Harrel Carina, MS, OTR/L 04/05/2019, 3:11 PM  [place] MAIN [facility] SERVICES [address] [place], [place], [address] Phone: [phone]   Fax:  [phone]  Name: Curtis Powell MRN: [id number] Date of Birth: [birth year]

## 2019-04-05 NOTE — Therapy (Signed)
[place] MAIN [facility] SERVICES [address] [place], [place], [address] Phone: [phone]   Fax:  [phone]  Physical Therapy Treatment  Patient Details  Name: Curtis Powell MRN: [id number] Date of Birth: [birth year] No data recorded  Encounter Date: 04/05/2019  PT End of Session - 04/05/19 1445    Visit Number  162    Number of Visits  166    Date for PT Re-Evaluation  04/08/19    Authorization Time Period  authorized 11/16/18-05/19/19    PT Start Time  1351    PT Stop Time  1435    PT Time Calculation (min)  44 min    Equipment Utilized During Treatment  Gait belt    Activity Tolerance  Patient tolerated treatment well;Patient limited by fatigue    Behavior During Therapy  WFL for tasks assessed/performed       Past Medical History:  Diagnosis Date  . GERD (gastroesophageal reflux disease)   . Hyperlipidemia   . Hypertension   . Obesity   . Stroke [facility])     Past Surgical History:  Procedure Laterality Date  . BRAIN SURGERY      There were no vitals filed for this visit.  Subjective Assessment - 04/05/19 1357    Subjective  Patient reports doing well. He is having some soreness in left hand but otherwise feels well. Denies any new falls.    Pertinent History   Patient is a pleasant 53 year old male who presents to physical therapy for weakness and immobility secondary to CVA.  Had a stroke in Feb 2018. He was previously fully independent, but this stroke caused severe residual deficits, mainly on the right side as well as speech, and now he is unable to walk or perform most of his ADLs on his own. Entire right side is very weak. He still has a little difficulty with speech but his swallowing is improved to baseline. His mom had to move in with him and now is his main caregiver.     How long can you sit comfortably?  5 minutes    How long can you stand comfortably?  5 minutes    How long can you walk comfortably?  10 ft     Patient Stated Goals  Walk without walker, walk further with walker, get some strength in back     Currently in Pain?  Yes    Pain Score  5     Pain Location  Hand    Pain Orientation  Left    Pain Descriptors / Indicators  Aching    Pain Type  Acute pain    Pain Onset  1 to 4 weeks ago    Pain Frequency  Intermittent    Aggravating Factors   pressure to left hand    Pain Relieving Factors  unsure    Effect of Pain on Daily Activities  decreased use of hand;    Multiple Pain Sites  No         TREATMENT: Static stand no UE support x2 min with CGA for safety; Patient required cues for erect posture and to increase forward weight shift for better stance control. With increased fatigue he often exhibits forward flexed posture with posterior lean;   Standing in parallel bars: -alternate march x10 reps with B rail assist with cues for erect posture and to increase hip flexion for better strengthening -Heel raises x10 reps with B rail assist, x5 reps with 1  rail assist, x5 reps with no rail assist with min A for safety and cues for erect posture and weight shift for better stance control;  -Standing moving SAEBO balls up/down 3 rungs x1 set each UE; with CGA to min A for safety with cues for erect posture and to increase weight shift with reaching outside base of support to improve stance control;     Seated Marching with upright postureand5lb ankle weightsx20 bilaterally; LAQ 5lb ankle weights,20x each LE, focus on eccentric control.  Required min VCs to improve erect posture and increase AROM for better strengthening;   Gait on level surface x125 feet with RW ( on carpet and linoleum) with CGA for safety and chair follow with min VCs to increase erect posture, increase step length and to look forward rather than down at feet. HR 108 bpm following gait trial.    HR monitored throughout session with occasional rests required for return to therapeutic range. Following standing  exercise, HR in 105-110 bpm, upon 1-2 min rest HR decreases down to 90's;    Patient is challenged with holding prolonged static standing without UE support. He is able to perform for short duration but fatigues causing trunk flexion and occasional posterior LOB. He is progressing well with gait with improved gait speed and increased step length, although is still ambulating at a risk for falls.  He will continue to benefit from skilled PT services to address strength, balance and mobility deficits in order to improve function at home.                      PT Education - 04/05/19 1445    Education provided  Yes    Education Details  exercise technique, posture, gait safety, HEP    Person(s) Educated  Patient    Methods  Explanation;Verbal cues    Comprehension  Verbalized understanding;Returned demonstration;Verbal cues required       PT Short Term Goals - 02/11/19 1404      PT SHORT TERM GOAL #1   Title  Patient will be independent in home exercise program to improve strength/mobility for better functional independence with ADLs.    Baseline  hep compliant    Time  2    Period  Weeks    Status  Achieved      PT SHORT TERM GOAL #2   Title  Patient will require min cueing for STS transfer with CGA for increased independence with mobility.     Baseline  CGA    Time  2    Period  Weeks    Status  Achieved      PT SHORT TERM GOAL #3   Title  Patient will maintain upright posture for > 15 seconds to demonstrate strengthened postural control muscualture     Baseline  18 seconds     Time  2    Period  Weeks    Status  Achieved        PT Long Term Goals - 02/11/19 0001      PT LONG TERM GOAL #1   Title  Patient will perform 10 MWT in < 1 minute to demonstrate improved ambulatory velocity and capacity.     Baseline  02/11/19: 56 seconds with AFO 4/10: 2 minutes 45 seconds; 5/29: 2 minute 31 seconds; 3 minutes 22 seconds 8/22: 3 minutes 2 seconds  9/12:  2  minutes and 43 seconds 10/15: 76mnute 12 seconds 11/7: 1 minute 21 seconds 12/19: 1  min 16 seconds 3/3: 57 seconds; 08/26/18: 1:41.4s= 0.10 m/s 7/1: 1 minute 14 seconds 9/14: 61 seconds    Time  8    Period  Weeks    Status  Achieved      PT LONG TERM GOAL #2   Title  Patient (< 54 years old) will complete five times sit to stand test in < 10 seconds indicating an increased LE strength and improved balance.    Baseline  02/11/19: 36 seconds 5/19: 20.6s from [place], one hand on walker and one on WC, 80% upright posture 7/1: 22 seconds one hand on walker one hand on w/c  9/14: 53 seconds with full upright posture    Time  8    Period  Weeks    Status  Partially Met    Target Date  04/08/19      PT LONG TERM GOAL #3   Title  Patient will increase Berg Balance score by > 6 points (26/56) to demonstrate decreased fall risk during functional activities.    Baseline  9/12: 20/56 10/15: 21/56 11/7: 20/56 12/19: 22/56; 08/26/18: 11/56 7/1: 17/56  9/14: 23/56    Time  8    Period  Weeks    Status  Partially Met    Target Date  04/08/19      PT LONG TERM GOAL #4   Title  Patient will increase BLE gross strength to 4+/5 as to improve functional strength for independent gait, increased standing tolerance and increased ADL ability.    Baseline   02/11/19: hip flexion 4/5 hip extension and abduction 2+/54-/5 RLE, 10/15: 4/5 RLE 11/7: 4/5 RLE 3/3: 4/5 7/1:  hip flexion 4/5 hip extension 2/5 hip abduction/adduciton 2/5     Time  8    Period  Weeks    Status  Partially Met    Target Date  04/08/19      PT LONG TERM GOAL #5   Title  Patient will increase lower extremity functional scale to >40/80 to demonstrate improved functional mobility and increased tolerance with ADLs.     Baseline  02/10/19: 34/56 14/80; 12/26: 21/80; 2/18: 28/80  4/10; 25/80 5/29: 27/80; 7/16: 29/80 8/22: 30/80  9/12: 26/80 (feeling more tired due to moving) 10/15: 37/80 11/7: 23/80 3/3: 32/80; 08/25/18: 28/80 9/15: 32/56    Time  8     Period  Weeks    Status  Partially Met    Target Date  04/08/19      PT LONG TERM GOAL #6   Title  Patient will ambulate 150 ft with least assistive device and Supervision with no breaks to allow for increased mobility within home.    Baseline  11/4: 105 ft with AFO after 10 MWT 11/7: 196f 12/19: 115 ft  3/3: will assess next session, has done 200 ft with one rest break; 08/26/18: Pt fatigued after 30' today 9/14: 100 ft with CGA    Time  8    Period  Weeks    Status  Partially Met    Target Date  04/08/19      PT LONG TERM GOAL #7   Title  Patient will perform 10 MWT in <30 seconds for improved gait speed, gait mechanics, and functional capacity for mobility.     Baseline  11/4: 56 seconds with AFO and RW     Time  8    Period  Weeks    Status  New    Target Date  04/08/19  Plan - 04/05/19 1445    Clinical Impression Statement  Patient motivated and participated well within session. He was instructed in unsupported standing progressing to reaching outside base of support to challenge stance control. Patient exhibits increased flexed posture with posterior lean with increased fatigue. HR monitored throughout session with increased HR to low 110's with prolonged standing. Patient required short intermittent seated rest breaks. Reinforced HEP. He would benefit from additional skilled PT intervention to improve strength, balance and mobility;    Rehab Potential  Fair    Clinical Impairments Affecting Rehab Potential  hx of HTN, HLD, CVA, learning disability, Diabetes, brain tumor,     PT Frequency  2x / week    PT Duration  8 weeks    PT Treatment/Interventions  ADLs/Self Care Home Management;Aquatic Therapy;Ultrasound;Moist Heat;Traction;DME Instruction;Gait training;Stair training;Functional mobility training;Therapeutic activities;Therapeutic exercise;Orthotic Fit/Training;Neuromuscular re-education;Balance training;Patient/family education;Manual techniques;Wheelchair  mobility training;Passive range of motion;Energy conservation;Taping;Visual/perceptual remediation/compensation    PT Next Visit Plan  balance    PT Home Exercise Plan  TrA contraction, glute sets: needs updates. Potentially some time in supine flat in hospital bed, to improve hip extension deficits.     Consulted and Agree with Plan of Care  Patient    Family Member Consulted  mother       Patient will benefit from skilled therapeutic intervention in order to improve the following deficits and impairments:  Abnormal gait, Decreased activity tolerance, Decreased balance, Decreased knowledge of precautions, Decreased endurance, Decreased coordination, Decreased knowledge of use of DME, Decreased mobility, Decreased range of motion, Difficulty walking, Decreased safety awareness, Decreased strength, Impaired flexibility, Impaired perceived functional ability, Impaired tone, Postural dysfunction, Improper body mechanics, Pain  Visit Diagnosis: Muscle weakness (generalized)  Other lack of coordination  Other abnormalities of gait and mobility  Hemiplegia and hemiparesis following cerebral infarction affecting right dominant side [facility])     Problem List Patient Active Problem List   Diagnosis Date Noted  . Acute CVA (cerebrovascular accident) ([place]) 05/02/2016  . Left-sided weakness 05/01/2016    Jaymar Loeber PT, DPT 04/05/2019, 2:53 PM  [place] MAIN [facility] SERVICES [address] [place], [place], [address] Phone: [phone]   Fax:  [phone]  Name: ZORION NIMS MRN: [id number] Date of Birth: 02/18/1966

## 2019-04-07 ENCOUNTER — Encounter: Payer: Self-pay | Admitting: Occupational Therapy

## 2019-04-07 ENCOUNTER — Ambulatory Visit: Payer: Medicare HMO | Admitting: Occupational Therapy

## 2019-04-07 ENCOUNTER — Ambulatory Visit: Payer: Medicare HMO

## 2019-04-07 ENCOUNTER — Other Ambulatory Visit: Payer: Self-pay

## 2019-04-07 DIAGNOSIS — R278 Other lack of coordination: Secondary | ICD-10-CM

## 2019-04-07 DIAGNOSIS — M6281 Muscle weakness (generalized): Secondary | ICD-10-CM

## 2019-04-07 DIAGNOSIS — I69351 Hemiplegia and hemiparesis following cerebral infarction affecting right dominant side: Secondary | ICD-10-CM

## 2019-04-07 DIAGNOSIS — R2689 Other abnormalities of gait and mobility: Secondary | ICD-10-CM

## 2019-04-07 NOTE — Therapy (Addendum)
[place] MAIN [facility] SERVICES [address] [place], [place], [address] Phone: [phone]   Fax:  [phone]  Occupational Therapy Treatment  Patient Details  Name: Curtis Powell MRN: [id number] Date of Birth: [birth year] No data recorded  Encounter Date: 04/07/2019  OT End of Session - 04/07/19 1441    Visit Number  156    Number of Visits  185    Date for OT Re-Evaluation  05/05/19    Authorization Type  Progress reporting period starting 12/09/2018    OT Start Time  1430    OT Stop Time  1515    OT Time Calculation (min)  45 min    Equipment Utilized During Treatment  Long handled shoe horn    Activity Tolerance  Patient tolerated treatment well    Behavior During Therapy  WFL for tasks assessed/performed       Past Medical History:  Diagnosis Date  . GERD (gastroesophageal reflux disease)   . Hyperlipidemia   . Hypertension   . Obesity   . Stroke [facility])     Past Surgical History:  Procedure Laterality Date  . BRAIN SURGERY      There were no vitals filed for this visit.  Subjective Assessment - 04/07/19 1434    Patient is accompanied by:  Family member    Pertinent History  Pt. is a 53 y.o. male who suffered a CVA on 05/01/2016. Pt. was admitted to the hospital. Once discharged, he received Home Health PT and OT services for about a month. Pt. has had multiple CVAs over the past 8 years, and has had multiple falls in the past 6 months. Pt. resides in an apartment. Pt. has caregivers for 80 hours. Pt.'s mother stays with pt. at night, and assists with IADL tasks.      Patient Stated Goals  To be able to throw a ball and dribble a ball, do as much as I can for myself.     Currently in Pain?  Yes    Pain Score  1     Pain Location  Hand    Pain Orientation  Left    Pain Descriptors / Indicators  Aching    Pain Type  Chronic pain    Pain Onset  More than a month ago      OT TREATMENT    Neuro muscular re-education:  Pt.  worked on grasping, and manipulating small washers, and worked on positioning the hooks on a whiteboard positioned at an elevated vertical angle on the tabletop. Pt. worked on [name] skills grasping 1", 3/4", 1/2" flat washers, and placing them on the hooks. Pt. performed [name] tasks using the Grooved pegboard. Pt. worked on grasping the grooved pegs from a horizontal position, and moving the pegs to a vertical position in the hand to prepare for placing them in the grooved slot.   Selfcare:  Pt. performed toileting independently while standing at the commode. Pt. requires assist to move the w/c over the threshold of the doorway, and position the w/c. Pt. Worked on UE dressing donning a jacket with min-modA.  Response to Treatment:  Pt. is making steady progress. Pt. continues to present with limited LUE strength, and [name] skills. Pt.  continues to compensate proximally with leaning to the left, and hiking his right shoulder with reaching. Pt. Tends to drop multiple flat objects from the palm of his hand when attempting to store the objects in the palm of his hand while using  his 2nd digit, and thumb. Pt. continues to work on improving UE functioning in order to improve, and maximize indepndence with ADLs, and IADLs.                       OT Education - 04/07/19 1440    Education provided  Yes    Education Details  reaching    [name]) Educated  Patient    Methods  Explanation;Demonstration    Comprehension  Verbalized understanding;Returned demonstration;Verbal cues required    [name]) Educated  Patient    Methods  Explanation;Demonstration;Tactile cues;Verbal cues    Comprehension  Verbalized understanding;Returned demonstration;Verbal cues required;Tactile cues required       OT Short Term Goals - 03/03/17 1459      OT SHORT TERM GOAL #1   Title  `        OT Long Term Goals - 03/01/19 1532      OT LONG TERM GOAL #2   Title  Pt. will complete self grooming  with minA      OT LONG TERM GOAL #4   Title  Pt. will perform self dressing with minA and A/E as needed.    Baseline  requires assist with socks at times, diff with reaching to get pants over feet.  Able to get jacket on now and shirt with modified independence.    Time  12    Period  Weeks    Status  Partially Met    Target Date  05/05/19      OT LONG TERM GOAL #6   Title  Pt. will write his name efficiently with 100% legibility    Baseline  Pt continues to present with limited legibility and efficiency when writing name to sign in when riding the [place]    Time  12    Period  Weeks    Status  On-going    Target Date  05/05/19      OT LONG TERM GOAL #7   Title  Pt will independently and consistently follow HEP to increase UE strength to increase functional independence    Baseline  Pt to continues to work towards independence with exercises    Time  12    Period  Weeks    Status  On-going    Target Date  05/05/19      OT LONG TERM GOAL #8   Title  Pt. will require supervision ironing a shirt.    Baseline  Pt. requires CGA-minA, and complete set-up seated using a tabletop iron    Time  12    Period  Weeks    Status  On-going    Target Date  05/05/19      OT LONG TERM GOAL  #9   Baseline  Pt. will independently be able to sew a button onto a shirt.    Time  12    Period  Weeks    Status  On-going    Target Date  05/05/19      OT LONG TERM GOAL  #10   TITLE  Pt. will independently be able to throw a ball    Baseline  Pt. continues to be limited    Time  12    Period  Weeks    Status  On-going    Target Date  05/05/19      OT LONG TERM GOAL  #11   TITLE  Pt. will improve UE functional reaching to be able to independently  use his RUE to hand his clothes in the closest.    Baseline  Pt. continues to have difficulty.    Time  12    Period  Weeks    Status  On-going    Target Date  05/05/19            Plan - 04/07/19 1442    Clinical Impression Statement  Pt. is  making steady progress. Pt. continues to present with limited LUE strength, and [name] skills. Pt.  continues to compensate proximally with leaning to the left, and hiking his right shoulder with reaching. Pt. Tends to drop multiple flat objects from the palm of his hand when attempting to store the objects in the palm of his hand while using his 2nd digit, and thumb. Pt. continues to work on improving UE functioning in order to improve, and maximize indepndence with ADLs, and IADLs.    OT Occupational Profile and History  Problem Focused Assessment - Including review of records relating to presenting problem    Occupational performance deficits (Please refer to evaluation for details):  ADL's;IADL's    Body Structure / Function / Physical Skills  ADL;FMC;ROM;IADL;Pain;Coordination    Rehab Potential  Fair    Clinical Decision Making  Several treatment options, min-mod task modification necessary    OT Frequency  2x / week    OT Duration  12 weeks    OT Treatment/Interventions  Self-care/ADL training;Patient/family education;DME and/or AE instruction;Therapeutic exercise;Moist Heat;Neuromuscular education    Consulted and Agree with Plan of Care  Patient       Patient will benefit from skilled therapeutic intervention in order to improve the following deficits and impairments:   Body Structure / Function / Physical Skills: ADL, FMC, ROM, IADL, Pain, Coordination       Visit Diagnosis: Muscle weakness (generalized)  Other lack of coordination    Problem List Patient Active Problem List   Diagnosis Date Noted  . Acute CVA (cerebrovascular accident) ([place]) 05/02/2016  . Left-sided weakness 05/01/2016    Harrel Carina, MS, OTR/L 04/07/2019, 3:31 PM  [place] MAIN [facility] SERVICES [address] [place], [place], [address] Phone: [phone]   Fax:  [phone]  Name: Curtis Powell MRN: [id number] Date of Birth: 1965/07/25

## 2019-04-07 NOTE — Therapy (Signed)
[place] MAIN [facility] SERVICES [address] [place], [place], [address] Phone: [phone]   Fax:  [phone]  Physical Therapy Treatment/RECERT  Patient Details  Name: Curtis Powell MRN: [id number] Date of Birth: 1966/01/06 No data recorded  Encounter Date: 04/07/2019  PT End of Session - 04/07/19 1508    Visit Number  [id number]    Number of Visits  179    Date for PT Re-Evaluation  06/02/19    Authorization Time Period  authorized 11/16/18-05/19/19    PT Start Time  1552    PT Stop Time  1430    PT Time Calculation (min)  45 min    Equipment Utilized During Treatment  Gait belt    Activity Tolerance  Patient tolerated treatment well;Patient limited by fatigue    Behavior During Therapy  WFL for tasks assessed/performed       Past Medical History:  Diagnosis Date  . GERD (gastroesophageal reflux disease)   . Hyperlipidemia   . Hypertension   . Obesity   . Stroke [facility])     Past Surgical History:  Procedure Laterality Date  . BRAIN SURGERY      There were no vitals filed for this visit.      Goals already done?add into chart 12/21 10 MWT: 53 seconds with AFO and RW  5x STS: 16 seconds with BUE support Walk 150 ft: ambulate 100 ft with HR elevation to 124  BERG: deferred on '12/21 12/23 25/56 ' Static stand 32 seconds without support   unable to perform 150 ft ambulation goal due to elevated HR: LEFS: 35/80   Toileting: mod I for set up, entering/exiting door due to difficulty with negotiating elevated threshold ; mod A for negotiation of threshold, CGA/supervision for standing and toileting with SUE support  side step: HR 131 terminated in // bars. Sp02 97 BP taken due to elevated HR:  -vitals: 141/67   6" step toe taps with BUE support HR 131 after 10x each side, decreased to 108 upon sitting and back <100 after 1 minute sitting rest break  Seated LAQ: HR elevation to 113 5x each side.  Seated: Gluteal squeeze 15x 3  second holds GTB hamstring curl 15x each LE GTB abduction 20x  GTB marching 20x  TrA  Upright posture contraction 10x 3 second hold  Soccer ball kicks focusing on timing and muscle activation sequencing x20 each LE; more challenging RLE than LLE  Patients' vitals monitored throughout session due to elevated HR requiring frequent rest and task termination/ retrograding for HR to remain in therapeutic range.   Patient has improved gait speed with an increase in 10 MWT pace when performed a week prior as well as improved his stability with his BERG test. He was unable to perform long duration ambulation goal this session due to elevated HR. In general patient is progressing with mobility and stability slowly but steadily. He continues to demonstrate excellent motivation to progress mobility. Patients' vitals monitored throughout session due to elevated HR requiring frequent rest and task termination/ retrograding for HR to remain in therapeutic range. He would benefit from additional skilled PT intervention to improve strength, balance and mobility;                   PT Education - 04/07/19 1406    Education provided  Yes    Education Details  POC, goals, exercise technique    Person(s) Educated  Patient    Methods  Explanation;Demonstration;Tactile cues;Verbal  cues    Comprehension  Verbalized understanding;Returned demonstration;Verbal cues required;Tactile cues required       PT Short Term Goals - 04/07/19 1504      PT SHORT TERM GOAL #1   Title  Patient will be independent in home exercise program to improve strength/mobility for better functional independence with ADLs.    Baseline  hep compliant    Time  2    Period  Weeks    Status  Achieved      PT SHORT TERM GOAL #2   Title  Patient will require min cueing for STS transfer with CGA for increased independence with mobility.     Baseline  CGA    Time  2    Period  Weeks    Status  Achieved      PT SHORT TERM  GOAL #3   Title  Patient will maintain upright posture for > 15 seconds to demonstrate strengthened postural control muscualture     Baseline  18 seconds     Time  2    Period  Weeks    Status  Achieved        PT Long Term Goals - 04/07/19 0001      PT LONG TERM GOAL #2   Title  Patient (< 5 years old) will complete five times sit to stand test in < 10 seconds indicating an increased LE strength and improved balance.    Baseline  12/30: 16 secpmds BUE support 02/11/19: 36 seconds 5/19: 20.6s from WC, one hand on walker and one on WC, 80% upright posture 7/1: 22 seconds one hand on walker one hand on w/c  9/14: 53 seconds with full upright posture    Time  8    Period  Weeks    Status  Partially Met    Target Date  06/02/19      PT LONG TERM GOAL #3   Title  Patient will increase Berg Balance score by > 6 points (26/56) to demonstrate decreased fall risk during functional activities.    Baseline  12/23: 25/56 9/12: 20/56 10/15: 21/56 11/7: 20/56 12/19: 22/56; 08/26/18: 11/56 7/1: 17/56  9/14: 23/56    Time  8    Period  Weeks    Status  Partially Met    Target Date  06/02/19      PT LONG TERM GOAL #4   Title  Patient will increase BLE gross strength to 4+/5 as to improve functional strength for independent gait, increased standing tolerance and increased ADL ability.    Baseline   12/30: hip flexion 4/5 hip extension and abduction 3/5 02/11/19: hip flexion 4/5 hip extension and abduction 2+/54-/5 RLE, 10/15: 4/5 RLE 11/7: 4/5 RLE 3/3: 4/5 7/1:  hip flexion 4/5 hip extension 2/5 hip abduction/adduciton 2/5     Time  8    Period  Weeks    Status  Partially Met    Target Date  06/02/19      PT LONG TERM GOAL #5   Title  Patient will increase lower extremity functional scale to >40/80 to demonstrate improved functional mobility and increased tolerance with ADLs.     Baseline  12/30: 35/80 02/10/19: 34/56 14/80; 12/26: 21/80; 2/18: 28/80  4/10; 25/80 5/29: 27/80; 7/16: 29/80 8/22: 30/80   9/12: 26/80 (feeling more tired due to moving) 10/15: 37/80 11/7: 23/80 3/3: 32/80; 08/25/18: 28/80 9/15: 32/56    Time  8    Period  Weeks  Status  Partially Met    Target Date  06/02/19      PT LONG TERM GOAL #6   Title  Patient will ambulate 150 ft with least assistive device and Supervision with no breaks to allow for increased mobility within home.    Baseline  12/30: unable to perform due to elevated HR 11/4: 105 ft with AFO after 10 MWT 11/7: 185f 12/19: 115 ft  3/3: will assess next session, has done 200 ft with one rest break; 08/26/18: Pt fatigued after 30' today 9/14: 100 ft with CGA    Time  8    Period  Weeks    Status  Partially Met    Target Date  06/02/19      PT LONG TERM GOAL #7   Title  Patient will perform 10 MWT in <30 seconds for improved gait speed, gait mechanics, and functional capacity for mobility.     Baseline  11/4: 56 seconds with AFO and RW 12/30: 53 seconds with AFO and RW     Time  8    Period  Weeks    Status  Partially Met    Target Date  06/02/19            Plan - 04/07/19 1503    Clinical Impression Statement  Patient has improved gait speed with an increase in 10 MWT pace when performed a week prior as well as improved his stability with his BERG test. He was unable to perform long duration ambulation goal this session due to elevated HR. In general patient is progressing with mobility and stability slowly but steadily. He continues to demonstrate excellent motivation to progress mobility. Patients' vitals monitored throughout session due to elevated HR requiring frequent rest and task termination/ retrograding for HR to remain in therapeutic range. He would benefit from additional skilled PT intervention to improve strength, balance and mobility;    Rehab Potential  Fair    Clinical Impairments Affecting Rehab Potential  hx of HTN, HLD, CVA, learning disability, Diabetes, brain tumor,     PT Frequency  2x / week    PT Duration  8 weeks    PT  Treatment/Interventions  ADLs/Self Care Home Management;Aquatic Therapy;Ultrasound;Moist Heat;Traction;DME Instruction;Gait training;Stair training;Functional mobility training;Therapeutic activities;Therapeutic exercise;Orthotic Fit/Training;Neuromuscular re-education;Balance training;Patient/family education;Manual techniques;Wheelchair mobility training;Passive range of motion;Energy conservation;Taping;Visual/perceptual remediation/compensation    PT Next Visit Plan  balance    PT Home Exercise Plan  TrA contraction, glute sets: needs updates. Potentially some time in supine flat in hospital bed, to improve hip extension deficits.     Consulted and Agree with Plan of Care  Patient    Family Member Consulted  mother       Patient will benefit from skilled therapeutic intervention in order to improve the following deficits and impairments:  Abnormal gait, Decreased activity tolerance, Decreased balance, Decreased knowledge of precautions, Decreased endurance, Decreased coordination, Decreased knowledge of use of DME, Decreased mobility, Decreased range of motion, Difficulty walking, Decreased safety awareness, Decreased strength, Impaired flexibility, Impaired perceived functional ability, Impaired tone, Postural dysfunction, Improper body mechanics, Pain  Visit Diagnosis: Muscle weakness (generalized)  Other lack of coordination  Other abnormalities of gait and mobility  Hemiplegia and hemiparesis following cerebral infarction affecting right dominant side ([facility]     Problem List Patient Active Problem List   Diagnosis Date Noted  . [place] 05[place]  . Left-sided weakness 05/01/2016   MJanna Arch PT, DPT   04/07/2019, 3:10 PM  [place] MAIN [facility] SERVICES [address] [place], [place], [address] Phone: [phone]   Fax:  [phone]  Name: Curtis Powell MRN: [id number] Date of Birth:  [birth year]

## 2019-04-12 ENCOUNTER — Ambulatory Visit: Payer: Medicare HMO

## 2019-04-12 ENCOUNTER — Encounter: Payer: Medicare HMO | Admitting: Occupational Therapy

## 2019-04-14 ENCOUNTER — Ambulatory Visit: Payer: Medicare HMO | Attending: Family Medicine | Admitting: Occupational Therapy

## 2019-04-14 ENCOUNTER — Ambulatory Visit: Payer: Medicare HMO

## 2019-04-14 ENCOUNTER — Other Ambulatory Visit: Payer: Self-pay

## 2019-04-14 ENCOUNTER — Encounter: Payer: Self-pay | Admitting: Occupational Therapy

## 2019-04-14 DIAGNOSIS — R2689 Other abnormalities of gait and mobility: Secondary | ICD-10-CM

## 2019-04-14 DIAGNOSIS — R278 Other lack of coordination: Secondary | ICD-10-CM

## 2019-04-14 DIAGNOSIS — I69351 Hemiplegia and hemiparesis following cerebral infarction affecting right dominant side: Secondary | ICD-10-CM | POA: Insufficient documentation

## 2019-04-14 DIAGNOSIS — I639 Cerebral infarction, unspecified: Secondary | ICD-10-CM | POA: Insufficient documentation

## 2019-04-14 DIAGNOSIS — M6281 Muscle weakness (generalized): Secondary | ICD-10-CM | POA: Insufficient documentation

## 2019-04-14 DIAGNOSIS — R531 Weakness: Secondary | ICD-10-CM | POA: Insufficient documentation

## 2019-04-14 NOTE — Therapy (Signed)
[place] MAIN [facility] SERVICES [address] [place], [place], [address] Phone: [phone]   Fax:  [phone]  Occupational Therapy Treatment  Patient Details  Name: Curtis Powell MRN: [id number] Date of Birth: 04/21/1965 No data recorded  Encounter Date: 04/14/2019  OT End of Session - 04/14/19 1312    Visit Number  [id number]    Number of Visits  185    Date for OT Re-Evaluation  05/05/19    Authorization Type  Progress reporting period starting 12/09/2018    OT Start Time  1300    OT Stop Time  1345    OT Time Calculation (min)  45 min    Activity Tolerance  Patient tolerated treatment well    Behavior During Therapy  [facility] for tasks assessed/performed       Past Medical History:  Diagnosis Date  . GERD (gastroesophageal reflux disease)   . Hyperlipidemia   . Hypertension   . Obesity   . Stroke [facility])     Past Surgical History:  Procedure Laterality Date  . BRAIN SURGERY      There were no vitals filed for this visit.  Subjective Assessment - 04/14/19 1309    Subjective   Pt. reports left 3rd digit pain.    Patient is accompanied by:  Family member    Pertinent History  Pt. is a 54 y.o. male who suffered a CVA on 05/01/2016. Pt. was admitted to the hospital. Once discharged, he received Home Health PT and OT services for about a month. Pt. has had multiple CVAs over the past 8 years, and has had multiple falls in the past 6 months. Pt. resides in an apartment. Pt. has caregivers for 80 hours. Pt.'s mother stays with pt. at night, and assists with IADL tasks.      Currently in Pain?  No/denies      OT TREATMENT    Neuro muscular re-education:  Pt. worked on reaching using the shape tower. Pt. Grasped and moved the shapes through 3 vertical dowels of varying progressive heights. Pt. worked with 1" cuff weight in place. Pt. worked on reaching, grasping, and positioning 1", 3/4", and 1/2" washers onto magnetic hooks on a whiteboard  positioned at a vertical angle on the tabletop. Pt. worked on [facility] skills grasping 1/2" flat washers, and placing them on the hooks. Pt. Worked on right hand [facility] skills grasping, manipulating, sorting, and stacking 1", 3/4", and 1/2" washers.  Response to Treatment:  Pt. continues to make steady progress.  Pt. drops multiple objects from his hand during translatory movements with the right hand. pt. hand 5/10 pain in the left 3rd digit. Pt. continues to work on improving RUE strength, and [facility] skills in order to work towards increasing RUE engagement during ADLs, and IADLs, and maximizing overall independence.                           OT Education - 04/14/19 1312    Education provided  Yes    Education Details  reaching    [name]) Educated  Patient    Methods  Explanation;Demonstration    Comprehension  Verbalized understanding;Returned demonstration;Verbal cues required       OT Short Term Goals - 03/03/17 1459      OT SHORT TERM GOAL #1   Title  `        OT Long Term Goals - 03/01/19 1532  OT LONG TERM GOAL #2   Title  Pt. will complete self grooming with minA      OT LONG TERM GOAL #4   Title  Pt. will perform self dressing with minA and A/E as needed.    Baseline  requires assist with socks at times, diff with reaching to get pants over feet.  Able to get jacket on now and shirt with modified independence.    Time  12    Period  Weeks    Status  Partially Met    Target Date  05/05/19      OT LONG TERM GOAL #6   Title  Pt. will write his name efficiently with 100% legibility    Baseline  Pt continues to present with limited legibility and efficiency when writing name to sign in when riding the [place]    Time  12    Period  Weeks    Status  On-going    Target Date  05/05/19      OT LONG TERM GOAL #7   Title  Pt will independently and consistently follow HEP to increase UE strength to increase functional independence    Baseline  Pt to  continues to work towards independence with exercises    Time  12    Period  Weeks    Status  On-going    Target Date  05/05/19      OT LONG TERM GOAL #8   Title  Pt. will require supervision ironing a shirt.    Baseline  Pt. requires CGA-minA, and complete set-up seated using a tabletop iron    Time  12    Period  Weeks    Status  On-going    Target Date  05/05/19      OT LONG TERM GOAL  #9   Baseline  Pt. will independently be able to sew a button onto a shirt.    Time  12    Period  Weeks    Status  On-going    Target Date  05/05/19      OT LONG TERM GOAL  #10   TITLE  Pt. will independently be able to throw a ball    Baseline  Pt. continues to be limited    Time  12    Period  Weeks    Status  On-going    Target Date  05/05/19      OT LONG TERM GOAL  #11   TITLE  Pt. will improve UE functional reaching to be able to independently use his RUE to hand his clothes in the closest.    Baseline  Pt. continues to have difficulty.    Time  12    Period  Weeks    Status  On-going    Target Date  05/05/19            Plan - 04/14/19 1313    Clinical Impression Statement Pt. continues to make steady progress.  Pt. drops multiple objects from his hand during translatory movements with the right hand. pt. hand 5/10 pain in the left 3rd digit. Pt. continues to work on improving RUE strength, and [facility] skills in order to work towards increasing RUE engagement during ADLs, and IADLs, and maximizing overall independence.    OT Occupational Profile and History  Problem Focused Assessment - Including review of records relating to presenting problem    Occupational performance deficits (Please refer to evaluation for details):  ADL's;IADL's    Body  Structure / Function / Physical Skills  ADL;FMC;ROM;IADL;Pain;Coordination    Rehab Potential  Fair    Clinical Decision Making  Several treatment options, min-mod task modification necessary    OT Frequency  2x / week    OT Duration  12  weeks    OT Treatment/Interventions  Self-care/ADL training;Patient/family education;DME and/or AE instruction;Therapeutic exercise;Moist Heat;Neuromuscular education    Consulted and Agree with Plan of Care  Patient       Patient will benefit from skilled therapeutic intervention in order to improve the following deficits and impairments:   Body Structure / Function / Physical Skills: ADL, FMC, ROM, IADL, Pain, Coordination       Visit Diagnosis: Muscle weakness (generalized)  Other lack of coordination    Problem List Patient Active Problem List   Diagnosis Date Noted  . Acute CVA (cerebrovascular accident) ([place]) 05/02/2016  . Left-sided weakness 05/01/2016    Harrel Carina, MS, OTR/L 04/14/2019, 1:33 PM  [place] MAIN [facility] SERVICES [address] [place], [place], [address] Phone: [phone]   Fax:  [phone]  Name: Curtis Powell MRN: [id number] Date of Birth: [birth year]

## 2019-04-14 NOTE — Therapy (Signed)
[place] MAIN [facility] SERVICES [address] [name], [place], [address] Phone: [phone]   Fax:  [phone]  Physical Therapy Treatment  Patient Details  Name: Curtis Powell MRN: [id number] Date of Birth: 23-Jun-1965 No data recorded  Encounter Date: 04/14/2019  PT End of Session - 04/14/19 1423    Visit Number  [id number]    Number of Visits  179    Date for PT Re-Evaluation  06/02/19    Authorization Time Period  authorized 11/16/18-05/19/19    PT Start Time  1324    PT Stop Time  1430    PT Time Calculation (min)  42 min    Equipment Utilized During Treatment  Gait belt    Activity Tolerance  Patient tolerated treatment well;Patient limited by fatigue    Behavior During Therapy  WFL for tasks assessed/performed       Past Medical History:  Diagnosis Date  . GERD (gastroesophageal reflux disease)   . Hyperlipidemia   . Hypertension   . Obesity   . Stroke [facility])     Past Surgical History:  Procedure Laterality Date  . BRAIN SURGERY      There were no vitals filed for this visit.  Subjective Assessment - 04/14/19 1357    Subjective  Patient reports compliance with HEP, has been walking at home with aide. No falls or LOB since last session.    Pertinent History   Patient is a pleasant 54 year old male who presents to physical therapy for weakness and immobility secondary to CVA.  Had a stroke in Feb 2018. He was previously fully independent, but this stroke caused severe residual deficits, mainly on the right side as well as speech, and now he is unable to walk or perform most of his ADLs on his own. Entire right side is very weak. He still has a little difficulty with speech but his swallowing is improved to baseline. His mom had to move in with him and now is his main caregiver.     How long can you sit comfortably?  5 minutes    How long can you stand comfortably?  5 minutes    How long can you walk comfortably?  10 ft    Patient Stated  Goals  Walk without walker, walk further with walker, get some strength in back     Currently in Pain?  No/denies    Pain Onset  1 to 4 weeks ago             "Dance dance evolution" side step, forward step, backward step with SPT cueing for direction and limb to step onto colored arrows. x6 minutes  Ambulate forwards and backwards in // bars, cueing to PSIS, ASIS for upright posture and proper muscle activation  Side stepping in // bars. BUE support cueing for upright posture, tucking pelvis under prior to shifting weight and lifting leg. Multiple cues via tactile, verbal, and demonstration for proper form/safety.   balloon taps reaching inside/outside BOS with SUE support x3 trials. SPT CGA and cueing for upright posture   Seated: GTB: single arm rows 12x each UE, tactile cueing for reduction of scapular elevation/bicep curl  RTB hamstring curl 10x each LE  5lb ankle weight  -marching with upright posture 15x each LE  -LAQ 12x each LE   Soccer ball kicks focusing on timing and muscle activation sequencing x20 each LE; more challenging RLE than LLE   Patients' vitals monitored throughout session due  to elevated HR requiring frequent rest and task termination/ retrograding for HR to remain in therapeutic range.    Pt educated throughout session about proper posture and technique with exercises. Improved exercise technique, movement at target joints, use of target muscles after min to mod verbal, visual, tactile cues.       PT Education - 04/14/19 1418    Education provided  Yes    Education Details  exercise technique, body mechanics    Person(s) Educated  Patient    Methods  Explanation;Demonstration;Tactile cues;Verbal cues    Comprehension  Verbalized understanding;Returned demonstration;Verbal cues required;Tactile cues required       PT Short Term Goals - 04/07/19 1504      PT SHORT TERM GOAL #1   Title  Patient will be independent in home exercise program to  improve strength/mobility for better functional independence with ADLs.    Baseline  hep compliant    Time  2    Period  Weeks    Status  Achieved      PT SHORT TERM GOAL #2   Title  Patient will require min cueing for STS transfer with CGA for increased independence with mobility.     Baseline  CGA    Time  2    Period  Weeks    Status  Achieved      PT SHORT TERM GOAL #3   Title  Patient will maintain upright posture for > 15 seconds to demonstrate strengthened postural control muscualture     Baseline  18 seconds     Time  2    Period  Weeks    Status  Achieved        PT Long Term Goals - 04/07/19 0001      PT LONG TERM GOAL #2   Title  Patient (< 53 years old) will complete five times sit to stand test in < 10 seconds indicating an increased LE strength and improved balance.    Baseline  12/30: 16 secpmds BUE support 02/11/19: 36 seconds 5/19: 20.6s from WC, one hand on walker and one on WC, 80% upright posture 7/1: 22 seconds one hand on walker one hand on w/c  9/14: 53 seconds with full upright posture    Time  8    Period  Weeks    Status  Partially Met    Target Date  06/02/19      PT LONG TERM GOAL #3   Title  Patient will increase Berg Balance score by > 6 points (26/56) to demonstrate decreased fall risk during functional activities.    Baseline  12/23: 25/56 9/12: 20/56 10/15: 21/56 11/7: 20/56 12/19: 22/56; 08/26/18: 11/56 7/1: 17/56  9/14: 23/56    Time  8    Period  Weeks    Status  Partially Met    Target Date  06/02/19      PT LONG TERM GOAL #4   Title  Patient will increase BLE gross strength to 4+/5 as to improve functional strength for independent gait, increased standing tolerance and increased ADL ability.    Baseline   12/30: hip flexion 4/5 hip extension and abduction 3/5 02/11/19: hip flexion 4/5 hip extension and abduction 2+/54-/5 RLE, 10/15: 4/5 RLE 11/7: 4/5 RLE 3/3: 4/5 7/1:  hip flexion 4/5 hip extension 2/5 hip abduction/adduciton 2/5     Time   8    Period  Weeks    Status  Partially Met    Target Date  06/02/19      PT LONG TERM GOAL #5   Title  Patient will increase lower extremity functional scale to >40/80 to demonstrate improved functional mobility and increased tolerance with ADLs.     Baseline  12/30: 35/80 02/10/19: 34/56 14/80; 12/26: 21/80; 2/18: 28/80  4/10; 25/80 5/29: 27/80; 7/16: 29/80 8/22: 30/80  9/12: 26/80 (feeling more tired due to moving) 10/15: 37/80 11/7: 23/80 3/3: 32/80; 08/25/18: 28/80 9/15: 32/56    Time  8    Period  Weeks    Status  Partially Met    Target Date  06/02/19      PT LONG TERM GOAL #6   Title  Patient will ambulate 150 ft with least assistive device and Supervision with no breaks to allow for increased mobility within home.    Baseline  12/30: unable to perform due to elevated HR 11/4: 105 ft with AFO after 10 MWT 11/7: 113f 12/19: 115 ft  3/3: will assess next session, has done 200 ft with one rest break; 08/26/18: Pt fatigued after 30' today 9/14: 100 ft with CGA    Time  8    Period  Weeks    Status  Partially Met    Target Date  06/02/19      PT LONG TERM GOAL #7   Title  Patient will perform 10 MWT in <30 seconds for improved gait speed, gait mechanics, and functional capacity for mobility.     Baseline  11/4: 56 seconds with AFO and RW 12/30: 53 seconds with AFO and RW     Time  8    Period  Weeks    Status  Partially Met    Target Date  06/02/19            Plan - 04/14/19 1539    Clinical Impression Statement  Patient presents with excellent motivation to physical therapy. He is challenged with maintaining upright neutral posture with standing tasks however use of tactile and verbal cueing allows for improved stability. Scapular retractions require tactile and verbal cueing for correct performance.  He would benefit from additional skilled PT intervention to improve strength, balance and mobility;    Rehab Potential  Fair    Clinical Impairments Affecting Rehab Potential   hx of HTN, HLD, CVA, learning disability, Diabetes, brain tumor,     PT Frequency  2x / week    PT Duration  8 weeks    PT Treatment/Interventions  ADLs/Self Care Home Management;Aquatic Therapy;Ultrasound;Moist Heat;Traction;DME Instruction;Gait training;Stair training;Functional mobility training;Therapeutic activities;Therapeutic exercise;Orthotic Fit/Training;Neuromuscular re-education;Balance training;Patient/family education;Manual techniques;Wheelchair mobility training;Passive range of motion;Energy conservation;Taping;Visual/perceptual remediation/compensation    PT Next Visit Plan  balance    PT Home Exercise Plan  TrA contraction, glute sets: needs updates. Potentially some time in supine flat in hospital bed, to improve hip extension deficits.     Consulted and Agree with Plan of Care  Patient    Family Member Consulted  mother       Patient will benefit from skilled therapeutic intervention in order to improve the following deficits and impairments:  Abnormal gait, Decreased activity tolerance, Decreased balance, Decreased knowledge of precautions, Decreased endurance, Decreased coordination, Decreased knowledge of use of DME, Decreased mobility, Decreased range of motion, Difficulty walking, Decreased safety awareness, Decreased strength, Impaired flexibility, Impaired perceived functional ability, Impaired tone, Postural dysfunction, Improper body mechanics, Pain  Visit Diagnosis: Muscle weakness (generalized)  Other lack of coordination  Other abnormalities of gait and mobility     Problem  List Patient Active Problem List   Diagnosis Date Noted  . Acute CVA (cerebrovascular accident) ([place]) 05/02/2016  . Left-sided weakness 05/01/2016   Janna Arch, PT, DPT   04/14/2019, 3:41 PM  [place] MAIN [facility] SERVICES [address] [place], [place], [address] Phone: [phone]   Fax:  [phone]  Name: Curtis Powell MRN:  [id number] Date of Birth: January 02, 1966

## 2019-04-15 IMAGING — CT CT ABD-PELV W/ CM
2 of 5 series · 15 of 46 positions shown, 17 images · IV contrast (APPLIED)
Comparison: None.

CLINICAL DATA: Pelvic pain for the past 3 days.

EXAM:
CT ABDOMEN AND PELVIS WITH CONTRAST
TECHNIQUE: Multidetector CT imaging of the abdomen and pelvis was performed
using the standard protocol following bolus administration of
intravenous contrast.
CONTRAST:  100mL 5KMDB6-8KK IOPAMIDOL (5KMDB6-8KK) INJECTION 61%

[Series 2: axial st · axial · 0.75mm/px · z∈[-100,+300]mm · 12 of 92 slices shown, 14 images]
[im 6/92  soft-tissue]
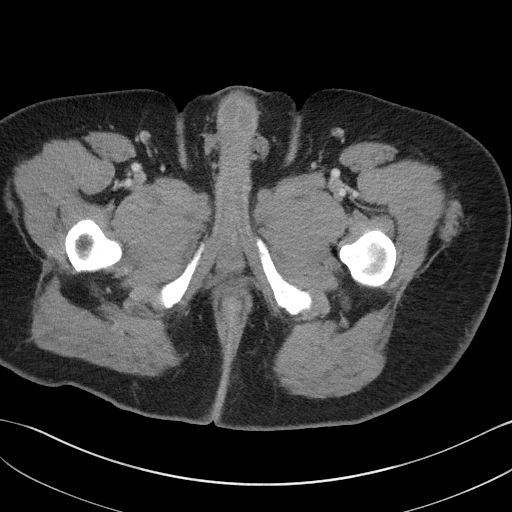
[im 6/92  bone]
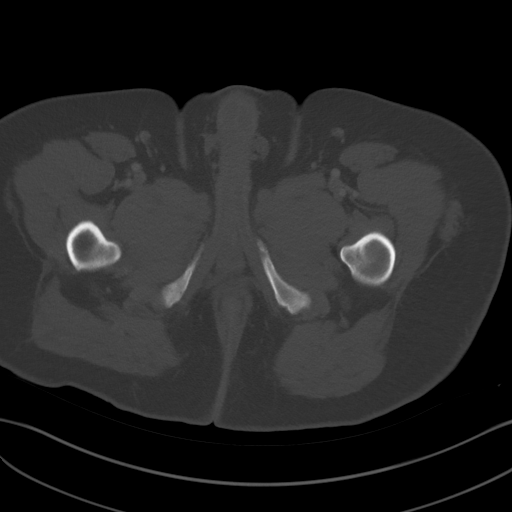
[im 16/92  soft-tissue]
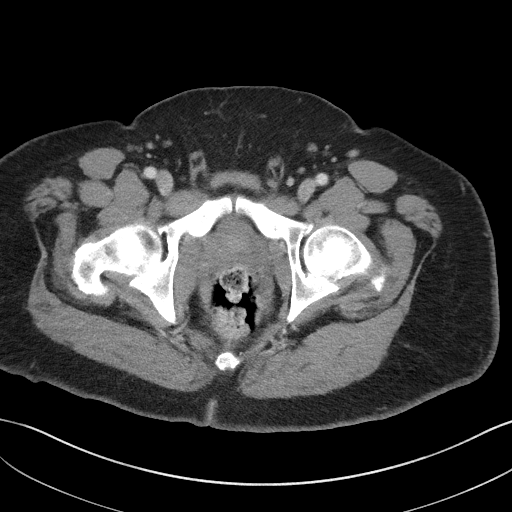
[im 21/92  soft-tissue]
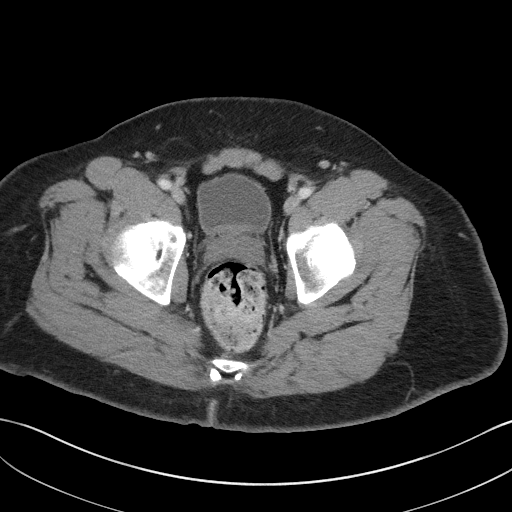
[im 26/92  soft-tissue]
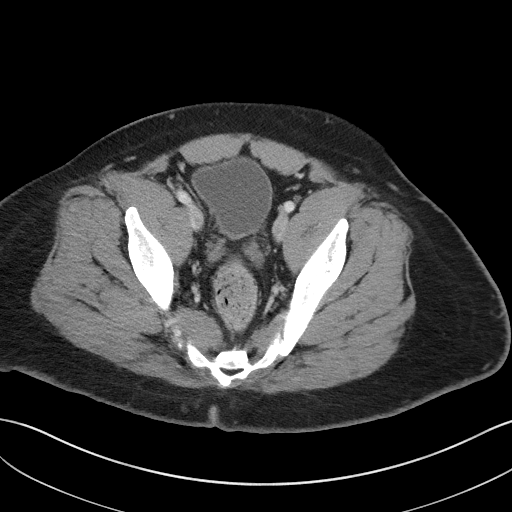
[im 36/92  soft-tissue]
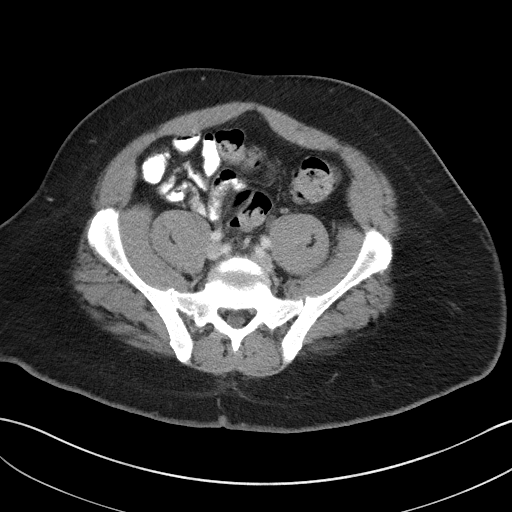
[im 41/92  soft-tissue]
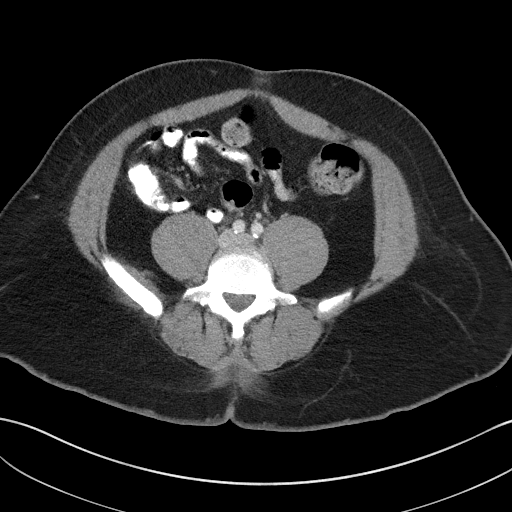
[im 51/92  soft-tissue]
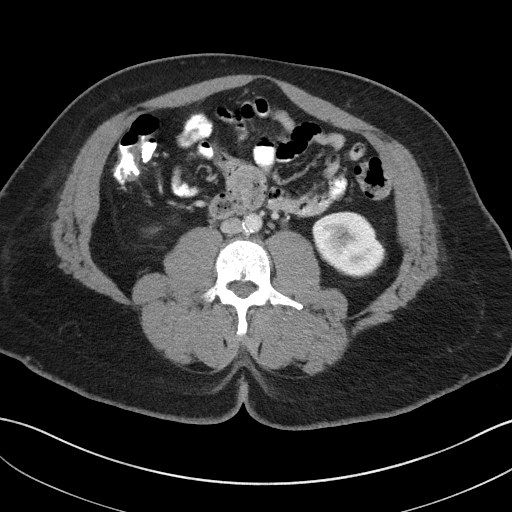
[im 56/92  soft-tissue]
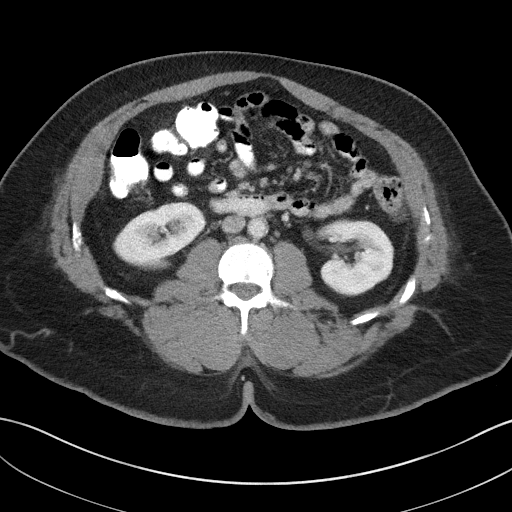
[im 66/92  soft-tissue]
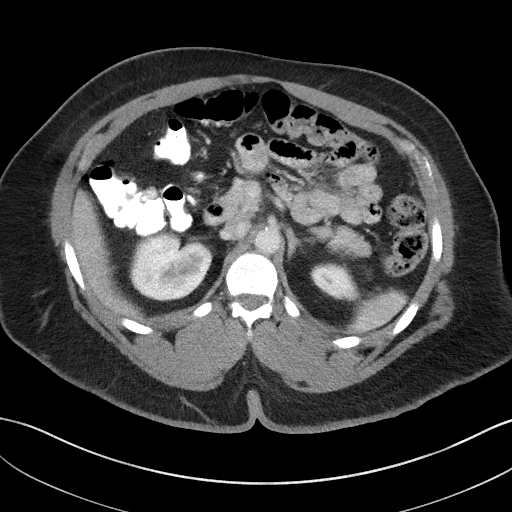
[im 66/92  bone]
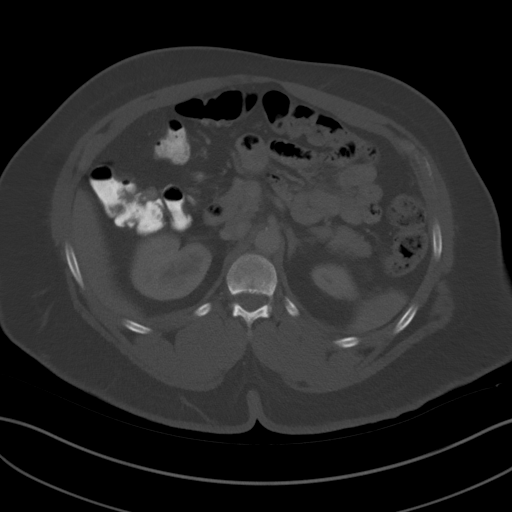
[im 71/92  soft-tissue]
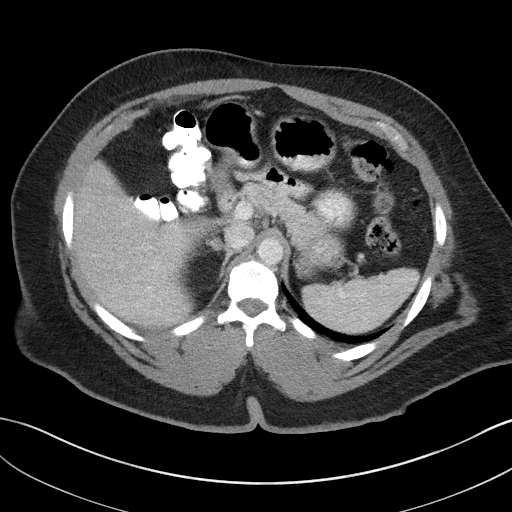
[im 76/92  soft-tissue]
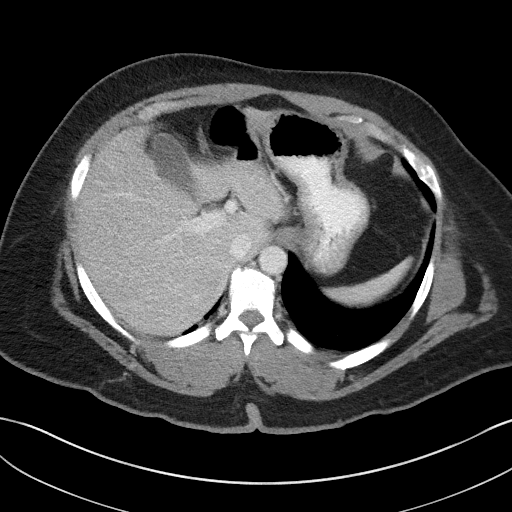
[im 86/92  soft-tissue]
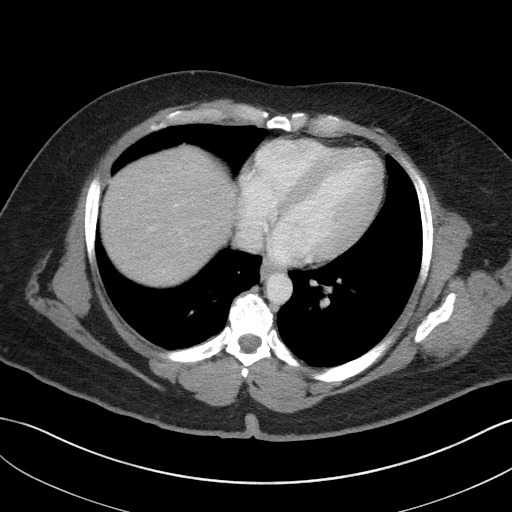

[Series 5: coronal st · coronal · 0.68mm/px · 3 of 86 slices shown]
[im 29/86  soft-tissue]
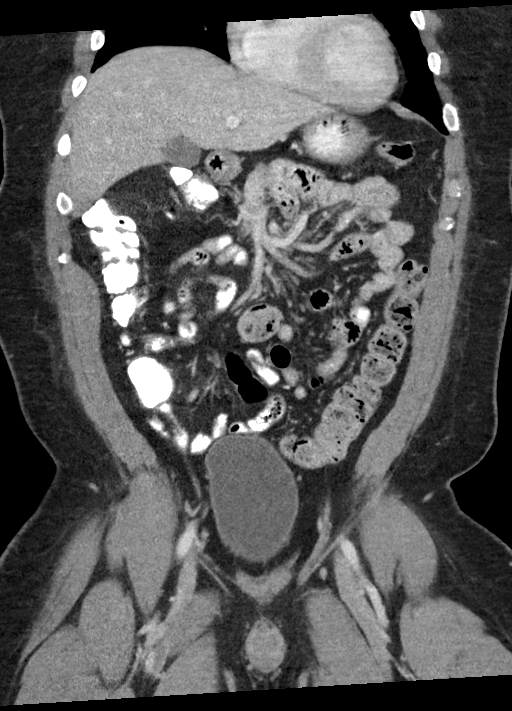
[im 38/86  soft-tissue]
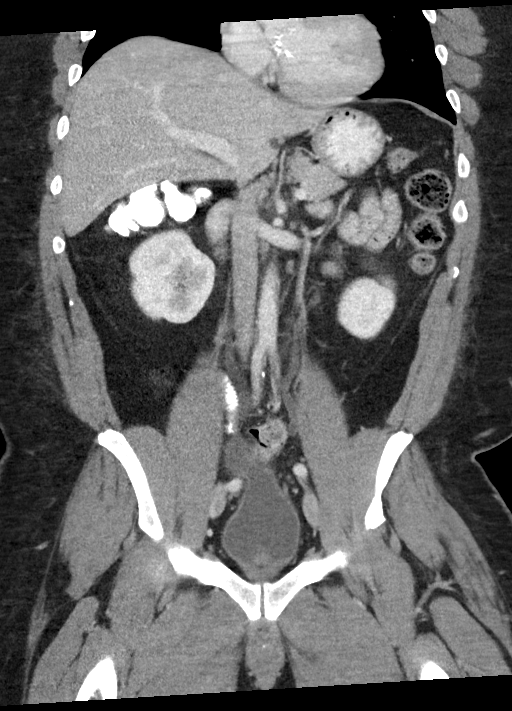
[im 48/86  soft-tissue]
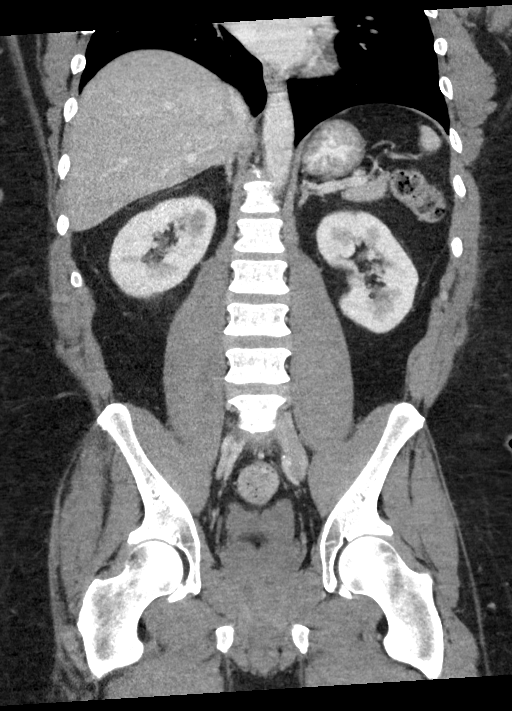

[15 of 46 positions shown; findings below may reference images not displayed]

FINDINGS: Lower chest: Limited visualization of the lower thorax is negative
for focal airspace opacity or pleural effusion.

Normal heart size. No pericardial effusion. Calcifications within
the aortic valve leaflets. No pericardial effusion.

Hepatobiliary: Normal hepatic contour. Punctate (sub 5 mm)
hypoattenuating lesions with the dome of the left lobe of the liver
(images 11 and 12, series 2) are too small to accurately
characterize though favored to represent hepatic cysts. Normal
appearance of the gallbladder given degree distention. No radiopaque
gallstones. No intra or extrahepatic biliary ductal dilatation. No
ascites.

Pancreas: Normal appearance of the pancreas

Spleen: Normal appearance of the spleen

Adrenals/Urinary Tract: There is symmetric enhancement and excretion
of the bilateral kidneys. No definite renal stones on this
postcontrast examination. No discrete renal lesions. No urine
obstruction or perinephric stranding.

Normal appearance the bilateral adrenal glands.

Normal appearance of the urinary bladder given degree distention.

Stomach/Bowel: Large colonic stool burden without evidence of
enteric obstruction. Ingested enteric contrast extensive the level
of the proximal transverse colon. Normal appearance of the terminal
ileum and appendix. No pneumoperitoneum, pneumatosis or portal
venous gas.

Vascular/Lymphatic: Scattered mixed calcified and noncalcified
atherosclerotic plaque with a normal caliber abdominal aorta. The
major branch vessels of the abdominal aorta appear patent on this
non CTA examination.

No bulky retroperitoneal, mesenteric, pelvic or inguinal
lymphadenopathy.

Reproductive: Suspected least partial hysterectomy.. No discrete
adnexal lesion.

Other: Regional soft tissues appear normal.

Musculoskeletal: No acute or aggressive osseous abnormalities.
Stigmata of DISH within the caudal aspect of the thoracic spine.
IMPRESSION: Large colonic stool burden, particularly, there is a large amount of
stool within the rectal vault, not resulting in enteric obstruction.
Otherwise, no explanation for patient's pelvic pain.

## 2019-04-19 ENCOUNTER — Other Ambulatory Visit: Payer: Self-pay

## 2019-04-19 ENCOUNTER — Ambulatory Visit: Payer: Medicare HMO

## 2019-04-19 ENCOUNTER — Ambulatory Visit: Payer: Medicare HMO | Admitting: Occupational Therapy

## 2019-04-19 ENCOUNTER — Encounter: Payer: Self-pay | Admitting: Occupational Therapy

## 2019-04-19 DIAGNOSIS — I69351 Hemiplegia and hemiparesis following cerebral infarction affecting right dominant side: Secondary | ICD-10-CM

## 2019-04-19 DIAGNOSIS — M6281 Muscle weakness (generalized): Secondary | ICD-10-CM | POA: Diagnosis not present

## 2019-04-19 DIAGNOSIS — R531 Weakness: Secondary | ICD-10-CM

## 2019-04-19 DIAGNOSIS — I639 Cerebral infarction, unspecified: Secondary | ICD-10-CM

## 2019-04-19 DIAGNOSIS — R278 Other lack of coordination: Secondary | ICD-10-CM

## 2019-04-19 DIAGNOSIS — R2689 Other abnormalities of gait and mobility: Secondary | ICD-10-CM

## 2019-04-19 NOTE — Therapy (Signed)
[place] MAIN [facility] SERVICES [address] [place], [place], [address] Phone: [phone]   Fax:  [phone]  Physical Therapy Treatment  Patient Details  Name: Curtis Powell MRN: [id number] Date of Birth: [birth year] No data recorded  Encounter Date: 04/19/2019  PT End of Session - 04/19/19 1349    Visit Number  [id number]    Number of Visits  179    Date for PT Re-Evaluation  06/02/19    Authorization Time Period  authorized 11/16/18-05/19/19    PT Start Time  0737    PT Stop Time  1427    PT Time Calculation (min)  42 min    Equipment Utilized During Treatment  Gait belt    Activity Tolerance  Patient tolerated treatment well    Behavior During Therapy  WFL for tasks assessed/performed       Past Medical History:  Diagnosis Date  . GERD (gastroesophageal reflux disease)   . Hyperlipidemia   . Hypertension   . Obesity   . Stroke [facility])     Past Surgical History:  Procedure Laterality Date  . BRAIN SURGERY      There were no vitals filed for this visit.  Subjective Assessment - 04/19/19 1348    Subjective  Patient reported that he is doing exercises at home. No falls or stumbles since last session.    Pertinent History   Patient is a pleasant 54 year old male who presents to physical therapy for weakness and immobility secondary to CVA.  Had a stroke in Feb 2018. He was previously fully independent, but this stroke caused severe residual deficits, mainly on the right side as well as speech, and now he is unable to walk or perform most of his ADLs on his own. Entire right side is very weak. He still has a little difficulty with speech but his swallowing is improved to baseline. His mom had to move in with him and now is his main caregiver.     Limitations  Sitting;Lifting;Standing;Walking;House hold activities    How long can you sit comfortably?  5 minutes    How long can you stand comfortably?  5 minutes    How long can you walk  comfortably?  10 ft    Patient Stated Goals  Walk without walker, walk further with walker, get some strength in back     Currently in Pain?  No/denies        TREATMENT:     Ambulate forwards in // bars, cueing to PSIS, ASIS for upright posture and proper muscle activation x1 lengths  Ambulate forwards and backwards in // bars x1 lengths  Standing side taps bilaterally 2x10 ea, more difficulty noted with RLE control    Side stepping in // bars. BUE support cueing for upright posture, tucking pelvis under prior to shifting weight and lifting leg. Multiple cues via tactile, verbal, and demonstration for proper form/safety.   Very fatiguing HR 144    Seated:   GTB: single arm rows 12x each UE, tactile cueing for reduction of scapular elevation/bicep curl   RTB hamstring curl 10x each LE   Seated hip abduction with rainbow ball x15 with PT assist   5lb ankle weight    -marching with upright posture 15x each LE    -LAQ 12x each LE with upright posture      Patients' vitals monitored throughout session due to elevated HR requiring frequent rest and task termination/ retrograding for HR to remain  in therapeutic range.     Pt educated throughout session about proper posture and technique with exercises. Improved exercise technique, movement at target joints, use of target muscles after min to mod verbal, visual, tactile cues.     pt response/clinical impression: The patient remains motivated during all exercises this session, most limited by fatigue and elevated HR. Of note after bout of side stepping HR 144 BPM, recovered to less than 110 in less than 2 minutes. The patient was able to perform improved upright posture with standing tasks with verbal and tactile cues. The patient would benefit from further skilled PT intervention to maximize strength, balance, and mobility.    PT Education - 04/19/19 1429    Education provided  Yes    Education Details  exercise technique,  body mechanics    Person(s) Educated  Patient    Methods  Explanation;Demonstration;Tactile cues;Verbal cues    Comprehension  Verbalized understanding;Returned demonstration;Verbal cues required;Tactile cues required;Need further instruction       PT Short Term Goals - 04/07/19 1504      PT SHORT TERM GOAL #1   Title  Patient will be independent in home exercise program to improve strength/mobility for better functional independence with ADLs.    Baseline  hep compliant    Time  2    Period  Weeks    Status  Achieved      PT SHORT TERM GOAL #2   Title  Patient will require min cueing for STS transfer with CGA for increased independence with mobility.     Baseline  CGA    Time  2    Period  Weeks    Status  Achieved      PT SHORT TERM GOAL #3   Title  Patient will maintain upright posture for > 15 seconds to demonstrate strengthened postural control muscualture     Baseline  18 seconds     Time  2    Period  Weeks    Status  Achieved        PT Long Term Goals - 04/07/19 0001      PT LONG TERM GOAL #2   Title  Patient (< 55 years old) will complete five times sit to stand test in < 10 seconds indicating an increased LE strength and improved balance.    Baseline  12/30: 16 secpmds BUE support 02/11/19: 36 seconds 5/19: 20.6s from WC, one hand on walker and one on WC, 80% upright posture 7/1: 22 seconds one hand on walker one hand on w/c  9/14: 53 seconds with full upright posture    Time  8    Period  Weeks    Status  Partially Met    Target Date  06/02/19      PT LONG TERM GOAL #3   Title  Patient will increase Berg Balance score by > 6 points (26/56) to demonstrate decreased fall risk during functional activities.    Baseline  12/23: 25/56 9/12: 20/56 10/15: 21/56 11/7: 20/56 12/19: 22/56; 08/26/18: 11/56 7/1: 17/56  9/14: 23/56    Time  8    Period  Weeks    Status  Partially Met    Target Date  06/02/19      PT LONG TERM GOAL #4   Title  Patient will increase BLE  gross strength to 4+/5 as to improve functional strength for independent gait, increased standing tolerance and increased ADL ability.    Baseline   12/30: hip flexion  4/5 hip extension and abduction 3/5 02/11/19: hip flexion 4/5 hip extension and abduction 2+/54-/5 RLE, 10/15: 4/5 RLE 11/7: 4/5 RLE 3/3: 4/5 7/1:  hip flexion 4/5 hip extension 2/5 hip abduction/adduciton 2/5     Time  8    Period  Weeks    Status  Partially Met    Target Date  06/02/19      PT LONG TERM GOAL #5   Title  Patient will increase lower extremity functional scale to >40/80 to demonstrate improved functional mobility and increased tolerance with ADLs.     Baseline  12/30: 35/80 02/10/19: 34/56 14/80; 12/26: 21/80; 2/18: 28/80  4/10; 25/80 5/29: 27/80; 7/16: 29/80 8/22: 30/80  9/12: 26/80 (feeling more tired due to moving) 10/15: 37/80 11/7: 23/80 3/3: 32/80; 08/25/18: 28/80 9/15: 32/56    Time  8    Period  Weeks    Status  Partially Met    Target Date  06/02/19      PT LONG TERM GOAL #6   Title  Patient will ambulate 150 ft with least assistive device and Supervision with no breaks to allow for increased mobility within home.    Baseline  12/30: unable to perform due to elevated HR 11/4: 105 ft with AFO after 10 MWT 11/7: 128f 12/19: 115 ft  3/3: will assess next session, has done 200 ft with one rest break; 08/26/18: Pt fatigued after 30' today 9/14: 100 ft with CGA    Time  8    Period  Weeks    Status  Partially Met    Target Date  06/02/19      PT LONG TERM GOAL #7   Title  Patient will perform 10 MWT in <30 seconds for improved gait speed, gait mechanics, and functional capacity for mobility.     Baseline  11/4: 56 seconds with AFO and RW 12/30: 53 seconds with AFO and RW     Time  8    Period  Weeks    Status  Partially Met    Target Date  06/02/19            Plan - 04/19/19 1430    Clinical Impression Statement  The patient remains motivated during all exercises this session, most limited by  fatigue and elevated HR. Of note after bout of side stepping HR 144 BPM, recovered to less than 110 in less than 2 minutes. The patient was able to perform improved upright posture with standing tasks with verbal and tactile cues. The patient would benefit from further skilled PT intervention to maximize strength, balance, and mobility.    Rehab Potential  Fair    Clinical Impairments Affecting Rehab Potential  hx of HTN, HLD, CVA, learning disability, Diabetes, brain tumor,     PT Frequency  2x / week    PT Duration  8 weeks    PT Treatment/Interventions  ADLs/Self Care Home Management;Aquatic Therapy;Ultrasound;Moist Heat;Traction;DME Instruction;Gait training;Stair training;Functional mobility training;Therapeutic activities;Therapeutic exercise;Orthotic Fit/Training;Neuromuscular re-education;Balance training;Patient/family education;Manual techniques;Wheelchair mobility training;Passive range of motion;Energy conservation;Taping;Visual/perceptual remediation/compensation    PT Next Visit Plan  balance    PT Home Exercise Plan  TrA contraction, glute sets: needs updates. Potentially some time in supine flat in hospital bed, to improve hip extension deficits.     Consulted and Agree with Plan of Care  Patient    Family Member Consulted  mother       Patient will benefit from skilled therapeutic intervention in order to improve the following deficits  and impairments:  Abnormal gait, Decreased activity tolerance, Decreased balance, Decreased knowledge of precautions, Decreased endurance, Decreased coordination, Decreased knowledge of use of DME, Decreased mobility, Decreased range of motion, Difficulty walking, Decreased safety awareness, Decreased strength, Impaired flexibility, Impaired perceived functional ability, Impaired tone, Postural dysfunction, Improper body mechanics, Pain  Visit Diagnosis: Muscle weakness (generalized)  Acute CVA (cerebrovascular accident) ([place])  Left-sided  weakness  Other lack of coordination  Other abnormalities of gait and mobility  Hemiplegia and hemiparesis following cerebral infarction affecting right dominant side [facility])     Problem List Patient Active Problem List   Diagnosis Date Noted  . Acute CVA (cerebrovascular accident) ([place]) 05/02/2016  . Left-sided weakness 05/01/2016    Lieutenant Diego PT, DPT 2:33 PM,04/19/19   [place] MAIN [facility] SERVICES [address] [place], [place], [address] Phone: [phone]   Fax:  [phone]  Name: CLEDITH ABDOU MRN: [id number] Date of Birth: [birth year]

## 2019-04-19 NOTE — Therapy (Addendum)
[place] MAIN [facility] SERVICES [address] [place], [place], [address] Phone: [phone]   Fax:  [phone]  Occupational Therapy Treatment  Patient Details  Name: Curtis Powell MRN: [id number] Date of Birth: [birth year] No data recorded  Encounter Date: 04/19/2019  OT End of Session - 04/19/19 1306    Visit Number  [id number]    Number of Visits  185    Date for OT Re-Evaluation  05/05/19    OT Start Time  1300    OT Stop Time  1345    OT Time Calculation (min)  45 min    Activity Tolerance  Patient tolerated treatment well    Behavior During Therapy  [facility] for tasks assessed/performed       Past Medical History:  Diagnosis Date  . GERD (gastroesophageal reflux disease)   . Hyperlipidemia   . Hypertension   . Obesity   . Stroke [facility])     Past Surgical History:  Procedure Laterality Date  . BRAIN SURGERY      There were no vitals filed for this visit.  Subjective Assessment - 04/19/19 1305    Patient is accompanied by:  Family member    Pertinent History  Pt. is a 54 y.o. male who suffered a CVA on 05/01/2016. Pt. was admitted to the hospital. Once discharged, he received Home Health PT and OT services for about a month. Pt. has had multiple CVAs over the past 8 years, and has had multiple falls in the past 6 months. Pt. resides in an apartment. Pt. has caregivers for 80 hours. Pt.'s mother stays with pt. at night, and assists with IADL tasks.      Currently in Pain?  No/denies       OT TREATMENT    Neuro muscular re-education:  Pt. worked on reaching using the shape tower. Pt. grasped and moved the shapes through 3 vertical dowels of varying progressive heights. Pt. worked with 1" cuff weight in place to the RUE. Pt. Was able to tolerate the weight in place for the duration of the task. Pt. worked on [facility] skills grasping 1" sticks, 1/4" collars, and 1/4" washers. Pt. worked on storing the objects in the palm, and translatory skills  moving the items from the palm of the hand to the tip of the 2nd digit, and thumb. Pt. worked on removing the pegs using bilateral alternating hand patterns. Pt. has difficulty performing translatory movements with the right hand.   Response to Treatment:  Pt. reports that his sister's husband has COVID.  Pt. reports that he is using his BUEs more during ADLS, and IADL tasks at home. Pt dropped multiple 1" sticks when attempting to perform translatory movements with the right hand. Pt. continues to work on improving BUE strength, and [facility] skills in order to improve UE functioning, and UE reaching in order to improve, and maximize independence with ADLs, and IADL tasks and decrease the need for caregiver assistance.                         OT Short Term Goals - 03/03/17 1459      OT SHORT TERM GOAL #1   Title  `        OT Long Term Goals - 03/01/19 1532      OT LONG TERM GOAL #2   Title  Pt. will complete self grooming with minA      OT LONG TERM GOAL #  4   Title  Pt. will perform self dressing with minA and A/E as needed.    Baseline  requires assist with socks at times, diff with reaching to get pants over feet.  Able to get jacket on now and shirt with modified independence.    Time  12    Period  Weeks    Status  Partially Met    Target Date  05/05/19      OT LONG TERM GOAL #6   Title  Pt. will write his name efficiently with 100% legibility    Baseline  Pt continues to present with limited legibility and efficiency when writing name to sign in when riding the [place]    Time  12    Period  Weeks    Status  On-going    Target Date  05/05/19      OT LONG TERM GOAL #7   Title  Pt will independently and consistently follow HEP to increase UE strength to increase functional independence    Baseline  Pt to continues to work towards independence with exercises    Time  12    Period  Weeks    Status  On-going    Target Date  05/05/19      OT LONG TERM GOAL #8    Title  Pt. will require supervision ironing a shirt.    Baseline  Pt. requires CGA-minA, and complete set-up seated using a tabletop iron    Time  12    Period  Weeks    Status  On-going    Target Date  05/05/19      OT LONG TERM GOAL  #9   Baseline  Pt. will independently be able to sew a button onto a shirt.    Time  12    Period  Weeks    Status  On-going    Target Date  05/05/19      OT LONG TERM GOAL  #10   TITLE  Pt. will independently be able to throw a ball    Baseline  Pt. continues to be limited    Time  12    Period  Weeks    Status  On-going    Target Date  05/05/19      OT LONG TERM GOAL  #11   TITLE  Pt. will improve UE functional reaching to be able to independently use his RUE to hand his clothes in the closest.    Baseline  Pt. continues to have difficulty.    Time  12    Period  Weeks    Status  On-going    Target Date  05/05/19            Plan - 04/19/19 1311    Clinical Impression Statement Pt. reports that his sister's husband has COVID.  Pt. reports that he is using his BUEs more during ADLS, and IADL tasks at home. Pt dropped multiple 1" sticks when attempting to perform translatory movements with the right hand. Pt. continues to work on improving BUE strength, and [facility] skills in order to improve UE functioning, and UE reaching in order to improve, and maximize independence with ADLs, and IADL tasks and decrease the need for caregiver assistance.    OT Occupational Profile and History  Problem Focused Assessment - Including review of records relating to presenting problem    Occupational performance deficits (Please refer to evaluation for details):  ADL's;IADL's    Body Structure / Function /  Physical Skills  ADL;FMC;ROM;IADL;Pain;Coordination    Rehab Potential  Fair    Clinical Decision Making  Several treatment options, min-mod task modification necessary    OT Frequency  2x / week    OT Duration  12 weeks    OT Treatment/Interventions   Self-care/ADL training;Patient/family education;DME and/or AE instruction;Therapeutic exercise;Moist Heat;Neuromuscular education    Consulted and Agree with Plan of Care  Patient       Patient will benefit from skilled therapeutic intervention in order to improve the following deficits and impairments:   Body Structure / Function / Physical Skills: ADL, FMC, ROM, IADL, Pain, Coordination       Visit Diagnosis: Muscle weakness (generalized)  Other lack of coordination    Problem List Patient Active Problem List   Diagnosis Date Noted  . Acute CVA (cerebrovascular accident) ([place]) 05/02/2016  . Left-sided weakness 05/01/2016    Harrel Carina, MS, OTR/L 04/19/2019, 1:20 PM  [place] MAIN [facility] SERVICES [address] [place], [place], [address] Phone: [phone]   Fax:  [phone]  Name: Curtis Powell MRN: [id number] Date of Birth: Jul 01, 1965

## 2019-04-21 ENCOUNTER — Ambulatory Visit: Payer: Medicare HMO

## 2019-04-26 ENCOUNTER — Other Ambulatory Visit: Payer: Self-pay

## 2019-04-26 ENCOUNTER — Ambulatory Visit: Payer: Medicare HMO | Admitting: Occupational Therapy

## 2019-04-26 ENCOUNTER — Encounter: Payer: Self-pay | Admitting: Occupational Therapy

## 2019-04-26 ENCOUNTER — Ambulatory Visit: Payer: Medicare HMO

## 2019-04-26 DIAGNOSIS — M6281 Muscle weakness (generalized): Secondary | ICD-10-CM

## 2019-04-26 DIAGNOSIS — R2689 Other abnormalities of gait and mobility: Secondary | ICD-10-CM

## 2019-04-26 DIAGNOSIS — I69351 Hemiplegia and hemiparesis following cerebral infarction affecting right dominant side: Secondary | ICD-10-CM

## 2019-04-26 DIAGNOSIS — I639 Cerebral infarction, unspecified: Secondary | ICD-10-CM

## 2019-04-26 DIAGNOSIS — R531 Weakness: Secondary | ICD-10-CM

## 2019-04-26 DIAGNOSIS — R278 Other lack of coordination: Secondary | ICD-10-CM

## 2019-04-26 NOTE — Therapy (Signed)
[place] MAIN [facility] SERVICES [address] [place], [place], [address] Phone: [phone]   Fax:  [phone]  Physical Therapy Treatment  Patient Details  Name: Curtis Powell MRN: [id number] Date of Birth: 07-11-1965 No data recorded  Encounter Date: 04/26/2019  PT End of Session - 04/26/19 1448    Visit Number  166    Number of Visits  179    Date for PT Re-Evaluation  06/02/19    Authorization Time Period  authorized 11/16/18-05/19/19    PT Start Time  1345    PT Stop Time  1420    PT Time Calculation (min)  35 min    Equipment Utilized During Treatment  Gait belt    Activity Tolerance  Patient tolerated treatment well    Behavior During Therapy  WFL for tasks assessed/performed       Past Medical History:  Diagnosis Date  . GERD (gastroesophageal reflux disease)   . Hyperlipidemia   . Hypertension   . Obesity   . Stroke [facility])     Past Surgical History:  Procedure Laterality Date  . BRAIN SURGERY      There were no vitals filed for this visit.  Subjective Assessment - 04/26/19 1446    Subjective  Patient stated he is doing well today, no complaints of pains or falls/stumbles since last visit.    Pertinent History   Patient is a pleasant 54 year old male who presents to physical therapy for weakness and immobility secondary to CVA.  Had a stroke in Feb 2018. He was previously fully independent, but this stroke caused severe residual deficits, mainly on the right side as well as speech, and now he is unable to walk or perform most of his ADLs on his own. Entire right side is very weak. He still has a little difficulty with speech but his swallowing is improved to baseline. His mom had to move in with him and now is his main caregiver.     Limitations  Sitting;Lifting;Standing;Walking;House hold activities    How long can you sit comfortably?  5 minutes    How long can you stand comfortably?  5 minutes    How long can you walk  comfortably?  10 ft    Patient Stated Goals  Walk without walker, walk further with walker, get some strength in back     Currently in Pain?  No/denies        TREATMENT: Ambulate forwards in // bars, cueing to PSIS, ASIS for upright posture and proper muscle activation x lengths  HR 115 recovered to 89 BPM   Ambulate forwards 1 length and backwards in // bars x1 lengths and forwards to return to WC HR 145BPM ->109 BPM with rest  Balloon tapping x2 mins in standing. Attempted standing rest break to recover HR (in 130s, able to decrease to low 120s, but increased back to 130, instructed to sit for rest break).     Seated:  GTB hamstring curl 10x each LE   Resisted L DF with GTB x15   5lb ankle weight:     -marching with upright posture 15x each LE     -LAQ 15x each LE with upright posture       Patients' vitals monitored throughout session due to elevated HR requiring frequent rest and task termination/ retrograding for HR to remain in therapeutic range.     Pt educated throughout session about proper posture and technique with exercises. Improved exercise  technique, movement at target joints, use of target muscles after min to mod verbal, visual, tactile cues.      pt response/clinical impression: Session shortened due to patient request to utilize bathroom. HR monitored throughout session; attempted standing rest break to address elevated HR, unable to lower until seated rest break taken. The patient continues to demonstrate gait abnormalities and increased reliance on UE support when weight bearing. Pt cued for step through gait pattern during forward ambulation with fair carry over, though fatigued quickly. The patient would benefit from further skilled PT to continue to progress towards goals.      PT Education - 04/26/19 1447    Education provided  Yes    Education Details  exercise technique/body Programmer, systems) Educated  Patient    Methods   Explanation;Demonstration;Verbal cues;Tactile cues    Comprehension  Verbalized understanding;Returned demonstration;Verbal cues required;Tactile cues required;Need further instruction       PT Short Term Goals - 04/07/19 1504      PT SHORT TERM GOAL #1   Title  Patient will be independent in home exercise program to improve strength/mobility for better functional independence with ADLs.    Baseline  hep compliant    Time  2    Period  Weeks    Status  Achieved      PT SHORT TERM GOAL #2   Title  Patient will require min cueing for STS transfer with CGA for increased independence with mobility.     Baseline  CGA    Time  2    Period  Weeks    Status  Achieved      PT SHORT TERM GOAL #3   Title  Patient will maintain upright posture for > 15 seconds to demonstrate strengthened postural control muscualture     Baseline  18 seconds     Time  2    Period  Weeks    Status  Achieved        PT Long Term Goals - 04/07/19 0001      PT LONG TERM GOAL #2   Title  Patient (< 73 years old) will complete five times sit to stand test in < 10 seconds indicating an increased LE strength and improved balance.    Baseline  12/30: 16 secpmds BUE support 02/11/19: 36 seconds 5/19: 20.6s from WC, one hand on walker and one on WC, 80% upright posture 7/1: 22 seconds one hand on walker one hand on w/c  9/14: 53 seconds with full upright posture    Time  8    Period  Weeks    Status  Partially Met    Target Date  06/02/19      PT LONG TERM GOAL #3   Title  Patient will increase Berg Balance score by > 6 points (26/56) to demonstrate decreased fall risk during functional activities.    Baseline  12/23: 25/56 9/12: 20/56 10/15: 21/56 11/7: 20/56 12/19: 22/56; 08/26/18: 11/56 7/1: 17/56  9/14: 23/56    Time  8    Period  Weeks    Status  Partially Met    Target Date  06/02/19      PT LONG TERM GOAL #4   Title  Patient will increase BLE gross strength to 4+/5 as to improve functional strength for  independent gait, increased standing tolerance and increased ADL ability.    Baseline   12/30: hip flexion 4/5 hip extension and abduction 3/5 02/11/19: hip flexion 4/5  hip extension and abduction 2+/54-/5 RLE, 10/15: 4/5 RLE 11/7: 4/5 RLE 3/3: 4/5 7/1:  hip flexion 4/5 hip extension 2/5 hip abduction/adduciton 2/5     Time  8    Period  Weeks    Status  Partially Met    Target Date  06/02/19      PT LONG TERM GOAL #5   Title  Patient will increase lower extremity functional scale to >40/80 to demonstrate improved functional mobility and increased tolerance with ADLs.     Baseline  12/30: 35/80 02/10/19: 34/56 14/80; 12/26: 21/80; 2/18: 28/80  4/10; 25/80 5/29: 27/80; 7/16: 29/80 8/22: 30/80  9/12: 26/80 (feeling more tired due to moving) 10/15: 37/80 11/7: 23/80 3/3: 32/80; 08/25/18: 28/80 9/15: 32/56    Time  8    Period  Weeks    Status  Partially Met    Target Date  06/02/19      PT LONG TERM GOAL #6   Title  Patient will ambulate 150 ft with least assistive device and Supervision with no breaks to allow for increased mobility within home.    Baseline  12/30: unable to perform due to elevated HR 11/4: 105 ft with AFO after 10 MWT 11/7: 142f 12/19: 115 ft  3/3: will assess next session, has done 200 ft with one rest break; 08/26/18: Pt fatigued after 30' today 9/14: 100 ft with CGA    Time  8    Period  Weeks    Status  Partially Met    Target Date  06/02/19      PT LONG TERM GOAL #7   Title  Patient will perform 10 MWT in <30 seconds for improved gait speed, gait mechanics, and functional capacity for mobility.     Baseline  11/4: 56 seconds with AFO and RW 12/30: 53 seconds with AFO and RW     Time  8    Period  Weeks    Status  Partially Met    Target Date  06/02/19            Plan - 04/26/19 1450    Clinical Impression Statement  Session shortened due to patient request to utilize bathroom. HR monitored throughout session; attempted standing rest break to address elevated  HR, unable to lower until seated rest break taken. The patient continues to demonstrate gait abnormalities and increased reliance on UE support when weight bearing. Pt cued for step through gait pattern during forward ambulation with fair carry over, though fatigued quickly. The patient would benefit from further skilled PT to continue to progress towards goals.    Rehab Potential  Fair    Clinical Impairments Affecting Rehab Potential  hx of HTN, HLD, CVA, learning disability, Diabetes, brain tumor,     PT Frequency  2x / week    PT Duration  8 weeks    PT Treatment/Interventions  ADLs/Self Care Home Management;Aquatic Therapy;Ultrasound;Moist Heat;Traction;DME Instruction;Gait training;Stair training;Functional mobility training;Therapeutic activities;Therapeutic exercise;Orthotic Fit/Training;Neuromuscular re-education;Balance training;Patient/family education;Manual techniques;Wheelchair mobility training;Passive range of motion;Energy conservation;Taping;Visual/perceptual remediation/compensation    PT Next Visit Plan  balance    PT Home Exercise Plan  TrA contraction, glute sets: needs updates. Potentially some time in supine flat in hospital bed, to improve hip extension deficits.     Consulted and Agree with Plan of Care  Patient    Family Member Consulted  mother       Patient will benefit from skilled therapeutic intervention in order to improve the following deficits and impairments:  Abnormal gait, Decreased activity tolerance, Decreased balance, Decreased knowledge of precautions, Decreased endurance, Decreased coordination, Decreased knowledge of use of DME, Decreased mobility, Decreased range of motion, Difficulty walking, Decreased safety awareness, Decreased strength, Impaired flexibility, Impaired perceived functional ability, Impaired tone, Postural dysfunction, Improper body mechanics, Pain  Visit Diagnosis: Muscle weakness (generalized)  Left-sided weakness  Other lack of  coordination  Other abnormalities of gait and mobility  Hemiplegia and hemiparesis following cerebral infarction affecting right dominant side (HCC)  Acute CVA (cerebrovascular accident) [facility])     Problem List Patient Active Problem List   Diagnosis Date Noted  . Acute CVA (cerebrovascular accident) ([place]) 05/02/2016  . Left-sided weakness 05/01/2016    Lieutenant Diego 04/26/2019, 3:07 PM  [place] MAIN [facility] SERVICES [address] [place], [place], [address] Phone: [phone]   Fax:  [phone]  Name: Curtis Powell MRN: [id number] Date of Birth: 1965-07-20

## 2019-04-26 NOTE — Therapy (Signed)
[place] MAIN [facility] SERVICES [address] [place], [place], [address] Phone: [phone]   Fax:  [phone]  Occupational Therapy Treatment  Patient Details  Name: Curtis Powell MRN: [id number] Date of Birth: 1965/06/14 No data recorded  Encounter Date: 04/26/2019  OT End of Session - 04/28/19 1447    Visit Number  159    Number of Visits  185    Date for OT Re-Evaluation  05/05/19    OT Start Time  1301    OT Stop Time  1345    OT Time Calculation (min)  44 min    Activity Tolerance  Patient tolerated treatment well    Behavior During Therapy  [facility] for tasks assessed/performed       Past Medical History:  Diagnosis Date  . GERD (gastroesophageal reflux disease)   . Hyperlipidemia   . Hypertension   . Obesity   . Stroke [facility])     Past Surgical History:  Procedure Laterality Date  . BRAIN SURGERY      There were no vitals filed for this visit.  Subjective Assessment - 04/28/19 1446    Subjective   Patient reports he got a shot in his left hand for middle finger pain,he reports it is feeling better now.    Pertinent History  Pt. is a 54 y.o. male who suffered a CVA on 05/01/2016. Pt. was admitted to the hospital. Once discharged, he received Home Health PT and OT services for about a month. Pt. has had multiple CVAs over the past 8 years, and has had multiple falls in the past 6 months. Pt. resides in an apartment. Pt. has caregivers for 80 hours. Pt.'s mother stays with pt. at night, and assists with IADL tasks.      Patient Stated Goals  To be able to throw a ball and dribble a ball, do as much as I can for myself.     Currently in Pain?  No/denies    Pain Score  0-No pain    Multiple Pain Sites  No        Therapeutic Exercises: Finger strengthening with push pins to place into moderate resistive board. Cues for prehension patterns, some difficulty pushing through corkboard when a spot has not been previously used.     Neuromuscular reeducation: Scatter pile with cards, reaching, flipping, stacking and sorting by suit from A to [facility], Patient requires increased time to scan pile and locate cards, some mild difficulty picking up cards from flat surface.  Occasional cues for card order.      Response to tx: Patient progressing well, some mild difficulty with push pins placing into moderate resistive board for finger strengthening.  Able to remove pins easily.  Patient was very focused on task of card scanning and putting items in order, occasional cues needed and patient reporting the task was challenging and making him think.  Will plan to do progress update next session and update goals.                       OT Education - 04/28/19 1447    Education provided  Yes    Education Details  coordination tasks, strengthening fingers    Person(s) Educated  Patient    Methods  Explanation;Demonstration    Comprehension  Verbalized understanding;Returned demonstration;Verbal cues required       OT Short Term Goals - 03/03/17 1459      OT SHORT TERM GOAL #  1   Title  `        OT Long Term Goals - 03/01/19 1532      OT LONG TERM GOAL #2   Title  Pt. will complete self grooming with minA      OT LONG TERM GOAL #4   Title  Pt. will perform self dressing with minA and A/E as needed.    Baseline  requires assist with socks at times, diff with reaching to get pants over feet.  Able to get jacket on now and shirt with modified independence.    Time  12    Period  Weeks    Status  Partially Met    Target Date  05/05/19      OT LONG TERM GOAL #6   Title  Pt. will write his name efficiently with 100% legibility    Baseline  Pt continues to present with limited legibility and efficiency when writing name to sign in when riding the [place]    Time  12    Period  Weeks    Status  On-going    Target Date  05/05/19      OT LONG TERM GOAL #7   Title  Pt will independently and consistently  follow HEP to increase UE strength to increase functional independence    Baseline  Pt to continues to work towards independence with exercises    Time  12    Period  Weeks    Status  On-going    Target Date  05/05/19      OT LONG TERM GOAL #8   Title  Pt. will require supervision ironing a shirt.    Baseline  Pt. requires CGA-minA, and complete set-up seated using a tabletop iron    Time  12    Period  Weeks    Status  On-going    Target Date  05/05/19      OT LONG TERM GOAL  #9   Baseline  Pt. will independently be able to sew a button onto a shirt.    Time  12    Period  Weeks    Status  On-going    Target Date  05/05/19      OT LONG TERM GOAL  #10   TITLE  Pt. will independently be able to throw a ball    Baseline  Pt. continues to be limited    Time  12    Period  Weeks    Status  On-going    Target Date  05/05/19      OT LONG TERM GOAL  #11   TITLE  Pt. will improve UE functional reaching to be able to independently use his RUE to hand his clothes in the closest.    Baseline  Pt. continues to have difficulty.    Time  12    Period  Weeks    Status  On-going    Target Date  05/05/19            Plan - 04/28/19 1447    Clinical Impression Statement  Patient progressing well, some mild difficulty with push pins placing into moderate resistive board for finger strengthening.  Able to remove pins easily.  Patient was very focused on task of card scanning and putting items in order, occasional cues needed and patient reporting the task was challenging and making him think.  Will plan to do progress update next session and update goals.    OT Occupational Profile  and History  Problem Focused Assessment - Including review of records relating to presenting problem    Occupational performance deficits (Please refer to evaluation for details):  ADL's;IADL's    Body Structure / Function / Physical Skills  ADL;FMC;ROM;IADL;Pain;Coordination    Rehab Potential  Fair     Clinical Decision Making  Several treatment options, min-mod task modification necessary    OT Frequency  2x / week    OT Duration  12 weeks    OT Treatment/Interventions  Self-care/ADL training;Patient/family education;DME and/or AE instruction;Therapeutic exercise;Moist Heat;Neuromuscular education    Consulted and Agree with Plan of Care  Patient       Patient will benefit from skilled therapeutic intervention in order to improve the following deficits and impairments:   Body Structure / Function / Physical Skills: ADL, FMC, ROM, IADL, Pain, Coordination       Visit Diagnosis: Muscle weakness (generalized)  Other lack of coordination  Hemiplegia and hemiparesis following cerebral infarction affecting right dominant side [facility])    Problem List Patient Active Problem List   Diagnosis Date Noted  . Acute CVA (cerebrovascular accident) ([place]) 05/02/2016  . Left-sided weakness 05/01/2016   Taneasha Fuqua T [name], OTR/L, CLT  Jaemarie Hochberg 04/28/2019, 2:54 PM  [place] MAIN [facility] SERVICES [address] [place], [place], [address] Phone: [phone]   Fax:  [phone]  Name: Curtis Powell MRN: [id number] Date of Birth: [birth year]

## 2019-04-28 ENCOUNTER — Encounter: Payer: Self-pay | Admitting: Occupational Therapy

## 2019-04-28 ENCOUNTER — Ambulatory Visit: Payer: Medicare HMO

## 2019-04-28 ENCOUNTER — Ambulatory Visit: Payer: Medicare HMO | Admitting: Occupational Therapy

## 2019-04-28 ENCOUNTER — Other Ambulatory Visit: Payer: Self-pay

## 2019-04-28 DIAGNOSIS — R531 Weakness: Secondary | ICD-10-CM

## 2019-04-28 DIAGNOSIS — R2689 Other abnormalities of gait and mobility: Secondary | ICD-10-CM

## 2019-04-28 DIAGNOSIS — M6281 Muscle weakness (generalized): Secondary | ICD-10-CM

## 2019-04-28 DIAGNOSIS — I639 Cerebral infarction, unspecified: Secondary | ICD-10-CM

## 2019-04-28 DIAGNOSIS — I69351 Hemiplegia and hemiparesis following cerebral infarction affecting right dominant side: Secondary | ICD-10-CM

## 2019-04-28 DIAGNOSIS — R278 Other lack of coordination: Secondary | ICD-10-CM

## 2019-04-28 NOTE — Therapy (Signed)
[place] MAIN [facility] SERVICES [address] [place], [place], [address] Phone: [phone]   Fax:  [phone]  Physical Therapy Treatment  Patient Details  Name: Curtis Powell MRN: [id number] Date of Birth: March 03, 1966 No data recorded  Encounter Date: 04/28/2019  PT End of Session - 04/28/19 1428    Visit Number  [id number]    Number of Visits  179    Date for PT Re-Evaluation  06/02/19    Authorization Time Period  authorized 11/16/18-05/19/19    Authorization - Visit Number  8    Authorization - Number of Visits  10    PT Start Time  0240    PT Stop Time  1429    PT Time Calculation (min)  42 min    Equipment Utilized During Treatment  Gait belt    Activity Tolerance  Patient tolerated treatment well    Behavior During Therapy  WFL for tasks assessed/performed       Past Medical History:  Diagnosis Date  . GERD (gastroesophageal reflux disease)   . Hyperlipidemia   . Hypertension   . Obesity   . Stroke [facility])     Past Surgical History:  Procedure Laterality Date  . BRAIN SURGERY      There were no vitals filed for this visit.  Subjective Assessment - 04/28/19 1431    Subjective  Patient reported he is doing well today, no pain or falls or stumbles since last PT visit.    Pertinent History   Patient is a pleasant 54 year old male who presents to physical therapy for weakness and immobility secondary to CVA.  Had a stroke in Feb 2018. He was previously fully independent, but this stroke caused severe residual deficits, mainly on the right side as well as speech, and now he is unable to walk or perform most of his ADLs on his own. Entire right side is very weak. He still has a little difficulty with speech but his swallowing is improved to baseline. His mom had to move in with him and now is his main caregiver.     Limitations  Sitting;Lifting;Standing;Walking;House hold activities    How long can you sit comfortably?  5 minutes    How  long can you stand comfortably?  5 minutes    How long can you walk comfortably?  10 ft    Patient Stated Goals  Walk without walker, walk further with walker, get some strength in back     Currently in Pain?  No/denies        Palloff press Unilateral hip abduction with YTB   TREATMENT: Therapeutic exercise: Side stepping in bars down and back, attempted standing ~30seconds, HR in 140s into 130s, unable to decrease in standing, seated rest break given.   Seated:  palloff press with GTB 2x12 Unilateral hip abduction with PT resistance x20 ea side Isometric resistance of posterior force (to activate abdominals) and then anterior force     Therapeutic activity: CGA provided throughout for safety, verbal cues for step length, upright posture, and breathing technique Standing tolerance: 2 min total, able to decrease from BUE to no UE support, CGA throughout for safety. Hands used for safe return to sitting Ambulate forwards in bar 2 lengths, stood 1 minute without UE support Hr in 120s, recovered quickly. Ambulate forwards/backwards in bar stood for 1 minute with single UE support HR in 120s, recovered quickly (perfoemd twice, Hr in 130s with second trial, able to stand  and perform PLB to decrease Hr to 120s). Patients' vitals monitored throughout session due to elevated HR requiring frequent rest and task termination/ retrograding for HR to remain in therapeutic range.     Pt educated throughout session about proper posture and technique with exercises. Improved exercise technique, movement at target joints, use of target muscles after min to mod verbal, visual, tactile cues.      pt response/clinical impression: Session focused on promoting functional tasks such as standing and ambulation tolerance. The patient was able to decrease HR in standing with cues for PLB when HR was in 130s, unable to decrease after side stepping activity due to increased exertion spent during task. The patient  continued to display excellent motivation during therapy and would benefit from further skilled PT intervention to progress towards goals.      PT Education - 04/28/19 1432    Education provided  Yes    Person(s) Educated  Patient    Methods  Explanation;Demonstration;Tactile cues;Verbal cues    Comprehension  Verbalized understanding;Returned demonstration;Verbal cues required;Tactile cues required;Need further instruction       PT Short Term Goals - 04/07/19 1504      PT SHORT TERM GOAL #1   Title  Patient will be independent in home exercise program to improve strength/mobility for better functional independence with ADLs.    Baseline  hep compliant    Time  2    Period  Weeks    Status  Achieved      PT SHORT TERM GOAL #2   Title  Patient will require min cueing for STS transfer with CGA for increased independence with mobility.     Baseline  CGA    Time  2    Period  Weeks    Status  Achieved      PT SHORT TERM GOAL #3   Title  Patient will maintain upright posture for > 15 seconds to demonstrate strengthened postural control muscualture     Baseline  18 seconds     Time  2    Period  Weeks    Status  Achieved        PT Long Term Goals - 04/07/19 0001      PT LONG TERM GOAL #2   Title  Patient (< 76 years old) will complete five times sit to stand test in < 10 seconds indicating an increased LE strength and improved balance.    Baseline  12/30: 16 secpmds BUE support 02/11/19: 36 seconds 5/19: 20.6s from WC, one hand on walker and one on WC, 80% upright posture 7/1: 22 seconds one hand on walker one hand on w/c  9/14: 53 seconds with full upright posture    Time  8    Period  Weeks    Status  Partially Met    Target Date  06/02/19      PT LONG TERM GOAL #3   Title  Patient will increase Berg Balance score by > 6 points (26/56) to demonstrate decreased fall risk during functional activities.    Baseline  12/23: 25/56 9/12: 20/56 10/15: 21/56 11/7: 20/56 12/19:  22/56; 08/26/18: 11/56 7/1: 17/56  9/14: 23/56    Time  8    Period  Weeks    Status  Partially Met    Target Date  06/02/19      PT LONG TERM GOAL #4   Title  Patient will increase BLE gross strength to 4+/5 as to improve functional strength for  independent gait, increased standing tolerance and increased ADL ability.    Baseline   12/30: hip flexion 4/5 hip extension and abduction 3/5 02/11/19: hip flexion 4/5 hip extension and abduction 2+/54-/5 RLE, 10/15: 4/5 RLE 11/7: 4/5 RLE 3/3: 4/5 7/1:  hip flexion 4/5 hip extension 2/5 hip abduction/adduciton 2/5     Time  8    Period  Weeks    Status  Partially Met    Target Date  06/02/19      PT LONG TERM GOAL #5   Title  Patient will increase lower extremity functional scale to >40/80 to demonstrate improved functional mobility and increased tolerance with ADLs.     Baseline  12/30: 35/80 02/10/19: 34/56 14/80; 12/26: 21/80; 2/18: 28/80  4/10; 25/80 5/29: 27/80; 7/16: 29/80 8/22: 30/80  9/12: 26/80 (feeling more tired due to moving) 10/15: 37/80 11/7: 23/80 3/3: 32/80; 08/25/18: 28/80 9/15: 32/56    Time  8    Period  Weeks    Status  Partially Met    Target Date  06/02/19      PT LONG TERM GOAL #6   Title  Patient will ambulate 150 ft with least assistive device and Supervision with no breaks to allow for increased mobility within home.    Baseline  12/30: unable to perform due to elevated HR 11/4: 105 ft with AFO after 10 MWT 11/7: 111f 12/19: 115 ft  3/3: will assess next session, has done 200 ft with one rest break; 08/26/18: Pt fatigued after 30' today 9/14: 100 ft with CGA    Time  8    Period  Weeks    Status  Partially Met    Target Date  06/02/19      PT LONG TERM GOAL #7   Title  Patient will perform 10 MWT in <30 seconds for improved gait speed, gait mechanics, and functional capacity for mobility.     Baseline  11/4: 56 seconds with AFO and RW 12/30: 53 seconds with AFO and RW     Time  8    Period  Weeks    Status  Partially  Met    Target Date  06/02/19            Plan - 04/28/19 1432    Clinical Impression Statement  Session focused on promoting functional tasks such as standing and ambulation tolerance. The patient was able to decrease HR in standing with cues for PLB when HR was in 130s, unable to decrease after side stepping activity due to increased exertion spent during task. The patient continued to display excellent motivation during therapy and would benefit from further skilled PT intervention to progress towards goals.    Rehab Potential  Fair    Clinical Impairments Affecting Rehab Potential  hx of HTN, HLD, CVA, learning disability, Diabetes, brain tumor,     PT Frequency  2x / week    PT Duration  8 weeks    PT Treatment/Interventions  ADLs/Self Care Home Management;Aquatic Therapy;Ultrasound;Moist Heat;Traction;DME Instruction;Gait training;Stair training;Functional mobility training;Therapeutic activities;Therapeutic exercise;Orthotic Fit/Training;Neuromuscular re-education;Balance training;Patient/family education;Manual techniques;Wheelchair mobility training;Passive range of motion;Energy conservation;Taping;Visual/perceptual remediation/compensation    PT Next Visit Plan  balance    PT Home Exercise Plan  TrA contraction, glute sets: needs updates. Potentially some time in supine flat in hospital bed, to improve hip extension deficits.     Consulted and Agree with Plan of Care  Patient    Family Member Consulted  mother  Patient will benefit from skilled therapeutic intervention in order to improve the following deficits and impairments:  Abnormal gait, Decreased activity tolerance, Decreased balance, Decreased knowledge of precautions, Decreased endurance, Decreased coordination, Decreased knowledge of use of DME, Decreased mobility, Decreased range of motion, Difficulty walking, Decreased safety awareness, Decreased strength, Impaired flexibility, Impaired perceived functional ability,  Impaired tone, Postural dysfunction, Improper body mechanics, Pain  Visit Diagnosis: Muscle weakness (generalized)  Hemiplegia and hemiparesis following cerebral infarction affecting right dominant side (HCC)  Left-sided weakness  Acute CVA (cerebrovascular accident) ([place])  Other lack of coordination  Other abnormalities of gait and mobility     Problem List Patient Active Problem List   Diagnosis Date Noted  . Acute CVA (cerebrovascular accident) ([place]) 05/02/2016  . Left-sided weakness 05/01/2016    Lieutenant Diego PT, DPT 2:36 PM,04/28/19   [place] MAIN [facility] SERVICES [address] [place], [place], [address] Phone: [phone]   Fax:  [phone]  Name: Curtis Powell MRN: [id number] Date of Birth: December 24, 1965

## 2019-04-28 NOTE — Therapy (Signed)
[place] MAIN [facility] SERVICES [address] [place], [place], [address] Phone: [phone]   Fax:  [phone]  Occupational Therapy Treatment/10th visit Progress Note Reporting period from 03/01/2019 to 04/28/2019  Patient Details  Name: Curtis Powell MRN: [id number] Date of Birth: 09/03/1965 No data recorded  Encounter Date: 04/28/2019  OT End of Session - 04/28/19 1457    Visit Number  [id number]    Number of Visits  185    Date for OT Re-Evaluation  05/05/19    OT Start Time  1430    OT Stop Time  1515    OT Time Calculation (min)  45 min    Activity Tolerance  Patient tolerated treatment well    Behavior During Therapy  [facility] for tasks assessed/performed       Past Medical History:  Diagnosis Date  . GERD (gastroesophageal reflux disease)   . Hyperlipidemia   . Hypertension   . Obesity   . Stroke [facility])     Past Surgical History:  Procedure Laterality Date  . BRAIN SURGERY      There were no vitals filed for this visit.  Subjective Assessment - 04/28/19 1455    Subjective   Patient asking to go to the bathroom at the beginning of the session. Reports he is doing well.  Patient reports his finger has a little pain today, he was hoping the shot would make it go away completely.    Pertinent History  Pt. is a 54 y.o. male who suffered a CVA on 05/01/2016. Pt. was admitted to the hospital. Once discharged, he received Home Health PT and OT services for about a month. Pt. has had multiple CVAs over the past 8 years, and has had multiple falls in the past 6 months. Pt. resides in an apartment. Pt. has caregivers for 80 hours. Pt.'s mother stays with pt. at night, and assists with IADL tasks.      Patient Stated Goals  To be able to throw a ball and dribble a ball, do as much as I can for myself.     Currently in Pain?  Yes    Pain Score  1     Pain Location  Finger (Comment which one)    Pain Orientation  Left    Pain Descriptors /  Indicators  Aching    Pain Type  Chronic pain    Pain Onset  More than a month ago    Pain Frequency  Intermittent    Multiple Pain Sites  No        scatter pile with deck of cards  [facility] OT Assessment - 04/28/19 1507      Coordination   Right 9 Hole Peg Test  48    Left 9 Hole Peg Test  43      Hand Function   Right Hand Grip (lbs)  50    Right Hand Lateral Pinch  19 lbs    Right Hand 3 Point Pinch  13 lbs    Left Hand Grip (lbs)  45    Left Hand Lateral Pinch  18 lbs    Left 3 point pinch  13 lbs      Therapeutic exercises:  Reassessment of goals, grip, pinch please refer to above chart REaching tasks in sitting to encourage ROM and strengthening of UE, cues for weight shifting to reach with greater ROM.    ADL: Patient seen for toileting with supervision for transfer with use of  grab bar, patient locking brakes on wheelchair independently, grooming at the sink with setup in sitting.  Doffing jacket with modified independence, donning with verbal cues.    Response to tx:   Patient has made good progress with noted improvements in measurements of grip, pinch, ROM and self care tasks.  He continues to benefit from skilled OT services to maximize safety and independence in daily tasks.             OT Education - 04/28/19 1456    Education provided  Yes    Education Details  coordination tasks, strengthening fingers    Person(s) Educated  Patient    Methods  Explanation;Demonstration    Comprehension  Verbalized understanding;Returned demonstration;Verbal cues required       OT Short Term Goals - 03/03/17 1459      OT SHORT TERM GOAL #1   Title  `        OT Long Term Goals - 04/28/19 1459      OT LONG TERM GOAL #2   Title  Pt. will complete self grooming with minA      OT LONG TERM GOAL #4   Title  Pt. will perform self dressing with minA and A/E as needed.    Baseline  04/28/2019 still needs help with underwear over feet    Time  12    Period   Weeks    Status  Partially Met      OT LONG TERM GOAL #6   Title  Pt. will write his name efficiently with 100% legibility    Baseline  Pt continues to present with limited legibility and efficiency when writing name to sign in when riding the [place]    Time  12    Period  Weeks    Status  On-going      OT LONG TERM GOAL #7   Title  Pt will independently and consistently follow HEP to increase UE strength to increase functional independence    Baseline  Pt to continues to work towards independence with exercises    Time  12    Period  Weeks    Status  On-going    Target Date  05/05/19      OT LONG TERM GOAL #8   Title  Pt. will require supervision ironing a shirt.    Baseline  Pt. requires CGA-minA, and complete set-up seated using a tabletop iron    Time  12    Period  Weeks    Status  On-going    Target Date  05/05/19      OT LONG TERM GOAL  #9   Baseline  Pt. will independently be able to sew a button onto a shirt.    Time  12    Period  Weeks    Status  On-going    Target Date  05/05/19      OT LONG TERM GOAL  #10   TITLE  Pt. will independently be able to throw a ball    Baseline  Pt. continues to be limited    Time  12    Period  Weeks    Status  On-going    Target Date  05/05/19      OT LONG TERM GOAL  #11   TITLE  Pt. will improve UE functional reaching to be able to independently use his RUE to hand his clothes in the closest.    Baseline  able to pull coat down, but  has difficulty with getting clothes out of a taller dresser.    Time  12    Period  Weeks    Status  On-going    Target Date  05/05/19      OT LONG TERM GOAL  #12   TITLE  Pt. wil be independent with shaving using an electric razer.    Baseline  04/28/2019 mom has to go back over it to ensure thoroughness.    Time  12    Period  Weeks    Status  On-going    Target Date  05/05/19            Plan - 04/28/19 1500    Clinical Impression Statement  Patient has made good progress with noted  improvements in measurements of grip, pinch, ROM and self care tasks.  He continues to benefit from skilled OT services to maximize safety and independence in daily tasks.    OT Occupational Profile and History  Problem Focused Assessment - Including review of records relating to presenting problem    Occupational performance deficits (Please refer to evaluation for details):  ADL's;IADL's    Body Structure / Function / Physical Skills  ADL;FMC;ROM;IADL;Pain;Coordination    Rehab Potential  Fair    Clinical Decision Making  Several treatment options, min-mod task modification necessary    OT Frequency  2x / week    OT Duration  12 weeks    OT Treatment/Interventions  Self-care/ADL training;Patient/family education;DME and/or AE instruction;Therapeutic exercise;Moist Heat;Neuromuscular education    Consulted and Agree with Plan of Care  Patient       Patient will benefit from skilled therapeutic intervention in order to improve the following deficits and impairments:   Body Structure / Function / Physical Skills: ADL, FMC, ROM, IADL, Pain, Coordination       Visit Diagnosis: Muscle weakness (generalized)  Left-sided weakness  Other lack of coordination    Problem List Patient Active Problem List   Diagnosis Date Noted  . Acute CVA (cerebrovascular accident) ([place]) 05/02/2016  . Left-sided weakness 05/01/2016   Gabrielle Wakeland T [name], OTR/L, CLT  Averianna Brugger 04/29/2019, 7:11 PM  [place] MAIN [facility] SERVICES [address] [place], [place], [address] Phone: [phone]   Fax:  [phone]  Name: Curtis Powell MRN: [id number] Date of Birth: [birth year]

## 2019-05-03 ENCOUNTER — Encounter: Payer: Self-pay | Admitting: Occupational Therapy

## 2019-05-03 ENCOUNTER — Other Ambulatory Visit: Payer: Self-pay

## 2019-05-03 ENCOUNTER — Ambulatory Visit: Payer: Medicare HMO | Admitting: Occupational Therapy

## 2019-05-03 ENCOUNTER — Ambulatory Visit: Payer: Medicare HMO

## 2019-05-03 DIAGNOSIS — M6281 Muscle weakness (generalized): Secondary | ICD-10-CM | POA: Diagnosis not present

## 2019-05-03 DIAGNOSIS — I69351 Hemiplegia and hemiparesis following cerebral infarction affecting right dominant side: Secondary | ICD-10-CM

## 2019-05-03 DIAGNOSIS — R278 Other lack of coordination: Secondary | ICD-10-CM

## 2019-05-03 NOTE — Therapy (Signed)
[place] MAIN [facility] SERVICES [address] [place], [place], [address] Phone: [phone]   Fax:  [phone]  Physical Therapy Treatment  Patient Details  Name: Curtis Powell MRN: [id number] Date of Birth: 1966-04-08 No data recorded  Encounter Date: 05/03/2019  PT End of Session - 05/03/19 1320    Visit Number  [id number]    Number of Visits  179    Date for PT Re-Evaluation  06/02/19    Authorization Time Period  authorized 11/16/18-05/19/19    Authorization - Visit Number  --    Authorization - Number of Visits  --    PT Start Time  2751    PT Stop Time  1430    PT Time Calculation (min)  45 min    Equipment Utilized During Treatment  Gait belt    Activity Tolerance  Patient tolerated treatment well    Behavior During Therapy  WFL for tasks assessed/performed       Past Medical History:  Diagnosis Date  . GERD (gastroesophageal reflux disease)   . Hyperlipidemia   . Hypertension   . Obesity   . Stroke [facility])     Past Surgical History:  Procedure Laterality Date  . BRAIN SURGERY      There were no vitals filed for this visit.  Subjective Assessment - 05/03/19 1319    Subjective  Patient reported he is doing well today, no pain or falls or stumbles since last PT visit.    Pertinent History   Patient is a pleasant 54 year old male who presents to physical therapy for weakness and immobility secondary to CVA.  Had a stroke in Feb 2018. He was previously fully independent, but this stroke caused severe residual deficits, mainly on the right side as well as speech, and now he is unable to walk or perform most of his ADLs on his own. Entire right side is very weak. He still has a little difficulty with speech but his swallowing is improved to baseline. His mom had to move in with him and now is his main caregiver.     Limitations  Sitting;Lifting;Standing;Walking;House hold activities    How long can you sit comfortably?  5 minutes    How  long can you stand comfortably?  5 minutes    How long can you walk comfortably?  10 ft    Patient Stated Goals  Walk without walker, walk further with walker, get some strength in back     Currently in Pain?  No/denies           TREATMENT   Ther-ex  NuStep L2 x 5 minutes for warm-up during history; Ambulate forward/retro in bar x 2 lengths each; Side stepping in // bars down and back, HR in 140s immediately afterwards with visible fatigue but drops quickly, seated rest break given.  Seated marches x 15 bilateral; Seated clams with manual resistance from therapist x 15 bilateral; Seated adductor squeeze with manual resistance from therapist x 15 bilateral; Seated LAQ with manual resistance from therapist x 15 bilateral;   Therapeutic Activity  Gait on level surface x 111', x 90' with RW ( on carpet and linoleum) with CGA for safety and chair follow with min VCs to increase erect posture, increase step length and to look forward rather than down at feet.  Static standing practice x 1 minutes without UE support, he has one LOB posterior which requires him to utilize parallel bars to correct. Improved ability  to shift weight forward noted today, CGA throughout for safety. Hands used for safe return to sitting   Pt educated throughout session about proper posture and technique with exercises. Improved exercise technique, movement at target joints, use of target muscles after min to mod verbal, visual, tactile cues.    Session focused on promoting functional tasks such as standing and ambulation tolerance. Increased ambulation distance with patient today over two separate trials. He demonstrates improved static balance today in parallel bars without UE support. He is able to complete all seated and standing exercises with therapist today. The patient continued to display excellent motivation during therapy and would benefit from further skilled PT intervention to progress towards  goals.          PT Short Term Goals - 04/07/19 1504      PT SHORT TERM GOAL #1   Title  Patient will be independent in home exercise program to improve strength/mobility for better functional independence with ADLs.    Baseline  hep compliant    Time  2    Period  Weeks    Status  Achieved      PT SHORT TERM GOAL #2   Title  Patient will require min cueing for STS transfer with CGA for increased independence with mobility.     Baseline  CGA    Time  2    Period  Weeks    Status  Achieved      PT SHORT TERM GOAL #3   Title  Patient will maintain upright posture for > 15 seconds to demonstrate strengthened postural control muscualture     Baseline  18 seconds     Time  2    Period  Weeks    Status  Achieved        PT Long Term Goals - 04/07/19 0001      PT LONG TERM GOAL #2   Title  Patient (< 54 years old) will complete five times sit to stand test in < 10 seconds indicating an increased LE strength and improved balance.    Baseline  12/30: 16 secpmds BUE support 02/11/19: 36 seconds 5/19: 20.6s from WC, one hand on walker and one on WC, 80% upright posture 7/1: 22 seconds one hand on walker one hand on w/c  9/14: 53 seconds with full upright posture    Time  8    Period  Weeks    Status  Partially Met    Target Date  06/02/19      PT LONG TERM GOAL #3   Title  Patient will increase Berg Balance score by > 6 points (26/56) to demonstrate decreased fall risk during functional activities.    Baseline  12/23: 25/56 9/12: 20/56 10/15: 21/56 11/7: 20/56 12/19: 22/56; 08/26/18: 11/56 7/1: 17/56  9/14: 23/56    Time  8    Period  Weeks    Status  Partially Met    Target Date  06/02/19      PT LONG TERM GOAL #4   Title  Patient will increase BLE gross strength to 4+/5 as to improve functional strength for independent gait, increased standing tolerance and increased ADL ability.    Baseline   12/30: hip flexion 4/5 hip extension and abduction 3/5 02/11/19: hip flexion 4/5  hip extension and abduction 2+/54-/5 RLE, 10/15: 4/5 RLE 11/7: 4/5 RLE 3/3: 4/5 7/1:  hip flexion 4/5 hip extension 2/5 hip abduction/adduciton 2/5     Time  8  Period  Weeks    Status  Partially Met    Target Date  06/02/19      PT LONG TERM GOAL #5   Title  Patient will increase lower extremity functional scale to >40/80 to demonstrate improved functional mobility and increased tolerance with ADLs.     Baseline  12/30: 35/80 02/10/19: 34/56 14/80; 12/26: 21/80; 2/18: 28/80  4/10; 25/80 5/29: 27/80; 7/16: 29/80 8/22: 30/80  9/12: 26/80 (feeling more tired due to moving) 10/15: 37/80 11/7: 23/80 3/3: 32/80; 08/25/18: 28/80 9/15: 32/56    Time  8    Period  Weeks    Status  Partially Met    Target Date  06/02/19      PT LONG TERM GOAL #6   Title  Patient will ambulate 150 ft with least assistive device and Supervision with no breaks to allow for increased mobility within home.    Baseline  12/30: unable to perform due to elevated HR 11/4: 105 ft with AFO after 10 MWT 11/7: 157f 12/19: 115 ft  3/3: will assess next session, has done 200 ft with one rest break; 08/26/18: Pt fatigued after 30' today 9/14: 100 ft with CGA    Time  8    Period  Weeks    Status  Partially Met    Target Date  06/02/19      PT LONG TERM GOAL #7   Title  Patient will perform 10 MWT in <30 seconds for improved gait speed, gait mechanics, and functional capacity for mobility.     Baseline  11/4: 56 seconds with AFO and RW 12/30: 53 seconds with AFO and RW     Time  8    Period  Weeks    Status  Partially Met    Target Date  06/02/19            Plan - 05/03/19 1320    Clinical Impression Statement  Session focused on promoting functional tasks such as standing and ambulation tolerance. Increased ambulation distance with patient today over two separate trials. He demonstrates improved static balance today in parallel bars without UE support. He is able to complete all seated and standing exercises with  therapist today. The patient continued to display excellent motivation during therapy and would benefit from further skilled PT intervention to progress towards goals.    Rehab Potential  Fair    Clinical Impairments Affecting Rehab Potential  hx of HTN, HLD, CVA, learning disability, Diabetes, brain tumor,     PT Frequency  2x / week    PT Duration  8 weeks    PT Treatment/Interventions  ADLs/Self Care Home Management;Aquatic Therapy;Ultrasound;Moist Heat;Traction;DME Instruction;Gait training;Stair training;Functional mobility training;Therapeutic activities;Therapeutic exercise;Orthotic Fit/Training;Neuromuscular re-education;Balance training;Patient/family education;Manual techniques;Wheelchair mobility training;Passive range of motion;Energy conservation;Taping;Visual/perceptual remediation/compensation    PT Next Visit Plan  balance    PT Home Exercise Plan  TrA contraction, glute sets: needs updates. Potentially some time in supine flat in hospital bed, to improve hip extension deficits.     Consulted and Agree with Plan of Care  Patient    Family Member Consulted  mother       Patient will benefit from skilled therapeutic intervention in order to improve the following deficits and impairments:  Abnormal gait, Decreased activity tolerance, Decreased balance, Decreased knowledge of precautions, Decreased endurance, Decreased coordination, Decreased knowledge of use of DME, Decreased mobility, Decreased range of motion, Difficulty walking, Decreased safety awareness, Decreased strength, Impaired flexibility, Impaired perceived functional ability, Impaired tone,  Postural dysfunction, Improper body mechanics, Pain  Visit Diagnosis: Muscle weakness (generalized)  Hemiplegia and hemiparesis following cerebral infarction affecting right dominant side [facility])     Problem List Patient Active Problem List   Diagnosis Date Noted  . Acute CVA (cerebrovascular accident) ([place]) 05/02/2016  .  Left-sided weakness 05/01/2016   Phillips Grout PT, DPT, GCS  Dyon Rotert 05/03/2019, 2:49 PM  [place] MAIN [facility] SERVICES [address] [place], [place], [address] Phone: [phone]   Fax:  [phone]  Name: Curtis Powell MRN: [id number] Date of Birth: 09/24/1965

## 2019-05-03 NOTE — Therapy (Signed)
[place] MAIN [facility] SERVICES [address] [place], [place], [address] Phone: [phone]   Fax:  [phone]  Occupational Therapy Treatment  Patient Details  Name: Curtis Powell MRN: [id number] Date of Birth: 1965-04-10 No data recorded  Encounter Date: 05/03/2019  OT End of Session - 05/04/19 1309    Visit Number  [id number]    Number of Visits  185    Date for OT Re-Evaluation  05/05/19    OT Start Time  1300    OT Stop Time  1345    OT Time Calculation (min)  45 min    Activity Tolerance  Patient tolerated treatment well    Behavior During Therapy  [facility] for tasks assessed/performed       Past Medical History:  Diagnosis Date  . GERD (gastroesophageal reflux disease)   . Hyperlipidemia   . Hypertension   . Obesity   . Stroke [facility])     Past Surgical History:  Procedure Laterality Date  . BRAIN SURGERY      There were no vitals filed for this visit.  Subjective Assessment - 05/04/19 1309    Subjective   Patient reports the pain in his left hand has increased again to 5/10, middle finger.  Feels the shot is wearing off.    Pertinent History  Pt. is a 54 y.o. male who suffered a CVA on 05/01/2016. Pt. was admitted to the hospital. Once discharged, he received Home Health PT and OT services for about a month. Pt. has had multiple CVAs over the past 8 years, and has had multiple falls in the past 6 months. Pt. resides in an apartment. Pt. has caregivers for 80 hours. Pt.'s mother stays with pt. at night, and assists with IADL tasks.      Patient Stated Goals  To be able to throw a ball and dribble a ball, do as much as I can for myself.     Currently in Pain?  Yes    Pain Score  5     Pain Location  Finger (Comment which one)    Pain Orientation  Left    Pain Type  Chronic pain    Pain Onset  More than a month ago    Pain Frequency  Intermittent       Therapeutic Exercise:   Resistive Pinch for finger strengthening with resistive  pins placing  on dowel sticks, one full stick with right hand then with left.  Occasional cues for pinch patterns with lateral, 3 point and 2 point pinch.  Difficulty with black pins the most resistive especially with left hand.    Neuromuscular Reeducation:     Manipulation of checkers placing into Connect 4 board for multiple trials and repetitions.  Cues for prehension patterns to pick up, translatory skills of the hand and using the hand for storage.  Able to hold up to 3 items at a time.    Manipulation of Small pegs, 1/2 inch in size to pick up and place into board, occasional cues required.    Response to tx:   Patient continues to progress with strength and ROM, difficulty at times with the most resistive pinch pins especially with left hand since his middle finger is hurting. Cues at times for prehension patterns.  Fatigues quickly with reaching tasks.  Continue to work towards goals in plan of care to increase independence in necessary daily tasks.  OT Short Term Goals - 03/03/17 1459      OT SHORT TERM GOAL #1   Title  `        OT Long Term Goals - 04/28/19 1459      OT LONG TERM GOAL #2   Title  Pt. will complete self grooming with minA      OT LONG TERM GOAL #4   Title  Pt. will perform self dressing with minA and A/E as needed.    Baseline  04/28/2019 still needs help with underwear over feet    Time  12    Period  Weeks    Status  Partially Met      OT LONG TERM GOAL #6   Title  Pt. will write his name efficiently with 100% legibility    Baseline  Pt continues to present with limited legibility and efficiency when writing name to sign in when riding the [place]    Time  12    Period  Weeks    Status  On-going      OT LONG TERM GOAL #7   Title  Pt will independently and consistently follow HEP to increase UE strength to increase functional independence    Baseline  Pt to continues to work towards independence with exercises     Time  12    Period  Weeks    Status  On-going    Target Date  05/05/19      OT LONG TERM GOAL #8   Title  Pt. will require supervision ironing a shirt.    Baseline  Pt. requires CGA-minA, and complete set-up seated using a tabletop iron    Time  12    Period  Weeks    Status  On-going    Target Date  05/05/19      OT LONG TERM GOAL  #9   Baseline  Pt. will independently be able to sew a button onto a shirt.    Time  12    Period  Weeks    Status  On-going    Target Date  05/05/19      OT LONG TERM GOAL  #10   TITLE  Pt. will independently be able to throw a ball    Baseline  Pt. continues to be limited    Time  12    Period  Weeks    Status  On-going    Target Date  05/05/19      OT LONG TERM GOAL  #11   TITLE  Pt. will improve UE functional reaching to be able to independently use his RUE to hand his clothes in the closest.    Baseline  able to pull coat down, but has difficulty with getting clothes out of a taller dresser.    Time  12    Period  Weeks    Status  On-going    Target Date  05/05/19      OT LONG TERM GOAL  #12   TITLE  Pt. wil be independent with shaving using an electric razer.    Baseline  04/28/2019 mom has to go back over it to ensure thoroughness.    Time  12    Period  Weeks    Status  On-going    Target Date  05/05/19            Plan - 05/04/19 1310    OT Occupational Profile and History  Problem Focused Assessment - Including review of  records relating to presenting problem    Occupational performance deficits (Please refer to evaluation for details):  ADL's;IADL's    Body Structure / Function / Physical Skills  ADL;FMC;ROM;IADL;Pain;Coordination    Rehab Potential  Fair    Clinical Decision Making  Several treatment options, min-mod task modification necessary    OT Frequency  2x / week    OT Duration  12 weeks    OT Treatment/Interventions  Self-care/ADL training;Patient/family education;DME and/or AE instruction;Therapeutic  exercise;Moist Heat;Neuromuscular education    Consulted and Agree with Plan of Care  Patient       Patient will benefit from skilled therapeutic intervention in order to improve the following deficits and impairments:   Body Structure / Function / Physical Skills: ADL, FMC, ROM, IADL, Pain, Coordination       Visit Diagnosis: Muscle weakness (generalized)  Hemiplegia and hemiparesis following cerebral infarction affecting right dominant side ([place])  Other lack of coordination    Problem List Patient Active Problem List   Diagnosis Date Noted  . Acute CVA (cerebrovascular accident) ([place]) 05/02/2016  . Left-sided weakness 05/01/2016  Takyra Cantrall T [name], OTR/L, CLT   Ora Bollig 05/05/2019, 1:10 PM  [place] MAIN [facility] SERVICES [address] [place], [place], [address] Phone: [phone]   Fax:  [phone]  Name: Curtis Powell MRN: [id number] Date of Birth: 1966/03/30

## 2019-05-05 ENCOUNTER — Other Ambulatory Visit: Payer: Self-pay

## 2019-05-05 ENCOUNTER — Encounter: Payer: Self-pay | Admitting: Occupational Therapy

## 2019-05-05 ENCOUNTER — Ambulatory Visit: Payer: Medicare HMO | Admitting: Occupational Therapy

## 2019-05-05 DIAGNOSIS — M6281 Muscle weakness (generalized): Secondary | ICD-10-CM | POA: Diagnosis not present

## 2019-05-05 DIAGNOSIS — R278 Other lack of coordination: Secondary | ICD-10-CM

## 2019-05-05 NOTE — Therapy (Signed)
[place] MAIN [facility] SERVICES [address] [place], [place], [address] Phone: [phone]   Fax:  [phone]  Occupational Therapy Treatment/Recertification Note  Patient Details  Name: Curtis Powell MRN: [id number] Date of Birth: [birth year] No data recorded  Encounter Date: 05/05/2019  OT End of Session - 05/05/19 1330    Visit Number  162    Number of Visits  185    Date for OT Re-Evaluation  07/28/19    Authorization Type  Progress reporting period starting 12/09/2018    OT Start Time  1302    OT Stop Time  1345    OT Time Calculation (min)  43 min    Equipment Utilized During Treatment  Long handled shoe horn    Activity Tolerance  Patient tolerated treatment well    Behavior During Therapy  WFL for tasks assessed/performed       Past Medical History:  Diagnosis Date  . GERD (gastroesophageal reflux disease)   . Hyperlipidemia   . Hypertension   . Obesity   . Stroke [facility])     Past Surgical History:  Procedure Laterality Date  . BRAIN SURGERY      There were no vitals filed for this visit.  Subjective Assessment - 05/05/19 1328    Subjective   Pt. reports that his left 3rd digit feels better.    Patient is accompanied by:  Family member    Pertinent History  Pt. is a 54 y.o. male who suffered a CVA on 05/01/2016. Pt. was admitted to the hospital. Once discharged, he received Home Health PT and OT services for about a month. Pt. has had multiple CVAs over the past 8 years, and has had multiple falls in the past 6 months. Pt. resides in an apartment. Pt. has caregivers for 80 hours. Pt.'s mother stays with pt. at night, and assists with IADL tasks.      Patient Stated Goals  To be able to throw a ball and dribble a ball, do as much as I can for myself.     Currently in Pain?  No/denies      OT TREATMENT    Neuro muscular re-education:  Pt. worked on grasping, manipulating, and positioning 1", 3/4", and 1/2" washers onto  magnetic hooks on a whiteboard positioned at a vertical angle on the tabletop. Pt. worked on [facility] skills grasping 1/2" flat washers, and placing them on the hooks.  Pt. dropped several washers during the task. Pt. required rest breaks to complete. Pt. worked on grasping, and manipulating 1/2" washers from a magnetic dish using point grasp pattern. Pt. worked on reaching up, stabilizing, and sustaining shoulder elevation while placing the washer over a small precise target on vertical dowels positioned at various angles. Pt. worked on translatory movements of the hand.   Response to Treatment:   Pt. has made steady progress. Pt. has improved with lateral pinch strength, and [place] skills. Pt. has progressed with LE dressing, however requires assist donning his underwear over his feet, and donning his jacket. Pt. continues to work on improving [facility], grip strength, and functional reaching in order to be able to acess items in his closet, and dresser, iron, shave, and perfrom UE, and LE dressing skills.        [facility] OT Assessment - 05/05/19 0001      Coordination   Right 9 Hole Peg Test  45    Left [address] Peg Test  43  Hand Function   Right Hand Grip (lbs)  45    Right Hand Lateral Pinch  23 lbs    Left Hand Grip (lbs)  38    Left Hand Lateral Pinch  18 lbs                       OT Education - 05/05/19 1330    Education provided  Yes    Education Details  translatory skills of the hand, using the hand for storage.    Person(s) Educated  Patient    Methods  Explanation;Demonstration    Comprehension  Verbalized understanding;Returned demonstration;Verbal cues required       OT Short Term Goals - 03/03/17 1459      OT SHORT TERM GOAL #1   Title  `        OT Long Term Goals - 05/05/19 1603      OT LONG TERM GOAL #4   Title  Pt. will perform self dressing with minA and A/E as needed.    Baseline  05/05/2019 still needs help with donning underwear over feet. Min-ModA  donning his jacket. (Depending on the jacket)    Time  12    Period  Weeks    Status  Partially Met    Target Date  07/28/19      OT LONG TERM GOAL #5   Title  Pt. will perform light home making tasks with minA      OT LONG TERM GOAL #6   Title  Pt. will write his name efficiently with 100% legibility    Baseline  05/05/2019: Pt continues to present with limited legibility and efficiency when writing name to sign in when riding the [place]    Time  12    Period  Weeks    Status  On-going    Target Date  07/28/19      OT LONG TERM GOAL #7   Title  Pt will independently and consistently follow HEP to increase UE strength to increase functional independence    Baseline  Pt to continues to work towards independence with exercises    Time  12    Period  Weeks    Status  On-going    Target Date  07/28/19      OT LONG TERM GOAL #8   Title  Pt. will require supervision ironing a shirt.    Baseline  05/05/2019:Pt. requires CGA-minA, and complete set-up seated using a tabletop iron    Time  12    Period  Weeks    Status  On-going    Target Date  07/28/19      OT LONG TERM GOAL  #9   Baseline  Pt. will independently be able to sew a button onto a shirt.    Time  12    Period  Weeks    Status  On-going    Target Date  07/28/19      OT LONG TERM GOAL  #10   TITLE  Pt. will independently be able to throw a ball    Baseline  Pt. continues to be limited    Time  12    Period  Weeks    Status  On-going    Target Date  07/28/19      OT LONG TERM GOAL  #11   TITLE  Pt. will improve UE functional reaching to be able to independently use his RUE to hand his clothes in the  closest.    Baseline  05/05/2019: Pt. continues to be able to pull coat down, but has difficulty with getting clothes out of a taller dresser.    Time  12    Period  Weeks    Status  On-going    Target Date  07/28/19      OT LONG TERM GOAL  #12   TITLE  Pt. wil be independent with shaving using an electric razer.     Baseline  05/05/2019: Pt.'s mother  has to go back over it to ensure thoroughness.    Time  12    Period  Weeks    Status  On-going    Target Date  07/28/19            Plan - 05/05/19 1613    Clinical Impression Statement  Pt. has made steady progress. Pt. has improved with lateral pinch strength, and [place] skills. Pt. has progressed with LE dressing, however requires assist donning his underwear over his feet, and donning his jacket. Pt. continues to work on improving [facility], grip strength, and functional reaching in order to be able to acess items in his closet, and dresser, iron, shave, and perfrom UE, and LE dressing skills.    OT Occupational Profile and History  Problem Focused Assessment - Including review of records relating to presenting problem    Occupational performance deficits (Please refer to evaluation for details):  ADL's;IADL's    Body Structure / Function / Physical Skills  ADL;FMC;ROM;IADL;Pain;Coordination    Rehab Potential  Fair    Clinical Decision Making  Several treatment options, min-mod task modification necessary    OT Frequency  2x / week    OT Duration  12 weeks    OT Treatment/Interventions  Self-care/ADL training;Patient/family education;DME and/or AE instruction;Therapeutic exercise;Moist Heat;Neuromuscular education    Consulted and Agree with Plan of Care  Patient       Patient will benefit from skilled therapeutic intervention in order to improve the following deficits and impairments:   Body Structure / Function / Physical Skills: ADL, FMC, ROM, IADL, Pain, Coordination       Visit Diagnosis: Muscle weakness (generalized)  Other lack of coordination    Problem List Patient Active Problem List   Diagnosis Date Noted  . Acute CVA (cerebrovascular accident) ([place]) 05/02/2016  . Left-sided weakness 05/01/2016    Harrel Carina, MS, OTR/L 05/05/2019, 4:18 PM  [place] MAIN [facility] SERVICES [address] [place], [place], [address] Phone: [phone]   Fax:  [phone]  Name: Curtis Powell MRN: [id number] Date of Birth: Jul 30, 1965

## 2019-05-10 ENCOUNTER — Ambulatory Visit: Payer: Medicare HMO | Attending: Family Medicine

## 2019-05-10 ENCOUNTER — Other Ambulatory Visit: Payer: Self-pay

## 2019-05-10 ENCOUNTER — Ambulatory Visit: Payer: Medicare HMO | Admitting: Occupational Therapy

## 2019-05-10 ENCOUNTER — Encounter: Payer: Self-pay | Admitting: Occupational Therapy

## 2019-05-10 DIAGNOSIS — R531 Weakness: Secondary | ICD-10-CM | POA: Diagnosis present

## 2019-05-10 DIAGNOSIS — M6281 Muscle weakness (generalized): Secondary | ICD-10-CM

## 2019-05-10 DIAGNOSIS — R278 Other lack of coordination: Secondary | ICD-10-CM | POA: Insufficient documentation

## 2019-05-10 DIAGNOSIS — R2689 Other abnormalities of gait and mobility: Secondary | ICD-10-CM | POA: Insufficient documentation

## 2019-05-10 DIAGNOSIS — I69351 Hemiplegia and hemiparesis following cerebral infarction affecting right dominant side: Secondary | ICD-10-CM | POA: Diagnosis present

## 2019-05-10 NOTE — Therapy (Signed)
[place] MAIN [facility] SERVICES [address] [place], [place], [address] Phone: [phone]   Fax:  [phone]  Occupational Therapy Treatment  Patient Details  Name: Curtis Powell MRN: [id number] Date of Birth: 1965/09/26 No data recorded  Encounter Date: 05/10/2019  OT End of Session - 05/10/19 1437    Visit Number  163    Number of Visits  185    Date for OT Re-Evaluation  07/28/19    Authorization Type  Progress reporting period starting 12/09/2018    OT Start Time  1345    OT Stop Time  1430    OT Time Calculation (min)  45 min    Activity Tolerance  Patient tolerated treatment well    Behavior During Therapy  [facility] for tasks assessed/performed       Past Medical History:  Diagnosis Date  . GERD (gastroesophageal reflux disease)   . Hyperlipidemia   . Hypertension   . Obesity   . Stroke [facility])     Past Surgical History:  Procedure Laterality Date  . BRAIN SURGERY      There were no vitals filed for this visit.  Subjective Assessment - 05/10/19 1436    Subjective   Pt. reports that he is doing well today.    Patient is accompanied by:  Family member    Pertinent History  Pt. is a 54 y.o. male who suffered a CVA on 05/01/2016. Pt. was admitted to the hospital. Once discharged, he received Home Health PT and OT services for about a month. Pt. has had multiple CVAs over the past 8 years, and has had multiple falls in the past 6 months. Pt. resides in an apartment. Pt. has caregivers for 80 hours. Pt.'s mother stays with pt. at night, and assists with IADL tasks.      Currently in Pain?  No/denies      OT TREATMENT    Neuro muscular re-education:  Pt. worked on grasping, manipulating, and positioning 1", 3/4", and 1/2" washers onto magnetic hooks on a whiteboard positioned at a vertical angle on the tabletop. Pt. worked on [facility] skills grasping 1/2" flat washers, and placing them on the hooks.  Pt. dropped several washers during the  task. Pt. required rest breaks to complete. Pt. worked on grasping, and manipulating 1/2" washers from a magnetic dish using Two point grasp pattern. Pt. dropped fewer washers today during the task, and presented with less compensation proximally. Pt. worked on grasping 1", 3/4", and 1/2" washers, stacking, and sorting them.  Response to Treatment:  Pt. continues to make steady progress. Pt. reports that his left 3rd digit is feeling much better after having had a second injection in it.  Pt. has difficulty sustaining his RUE in elevation while manipulating objects Pt. Dropped fewer item with his right hand, and presented with decreased compensation proximally. Pt. continues to work on improving right hand [facility] skills, and functional reaching in order to be able to each into cabinetry.                          OT Education - 05/10/19 1437    Education provided  Yes    Education Details  translatory skills of the hand, using the hand for storage.    Person(s) Educated  Patient    Methods  Explanation;Demonstration    Comprehension  Verbalized understanding;Returned demonstration;Verbal cues required       OT Short Term Goals -  03/03/17 1459      OT SHORT TERM GOAL #1   Title  `        OT Long Term Goals - 05/05/19 1603      OT LONG TERM GOAL #4   Title  Pt. will perform self dressing with minA and A/E as needed.    Baseline  05/05/2019 still needs help with donning underwear over feet. Min-ModA donning his jacket. (Depending on the jacket)    Time  12    Period  Weeks    Status  Partially Met    Target Date  07/28/19      OT LONG TERM GOAL #5   Title  Pt. will perform light home making tasks with minA      OT LONG TERM GOAL #6   Title  Pt. will write his name efficiently with 100% legibility    Baseline  05/05/2019: Pt continues to present with limited legibility and efficiency when writing name to sign in when riding the [place]    Time  12    Period  Weeks     Status  On-going    Target Date  07/28/19      OT LONG TERM GOAL #7   Title  Pt will independently and consistently follow HEP to increase UE strength to increase functional independence    Baseline  Pt to continues to work towards independence with exercises    Time  12    Period  Weeks    Status  On-going    Target Date  07/28/19      OT LONG TERM GOAL #8   Title  Pt. will require supervision ironing a shirt.    Baseline  05/05/2019:Pt. requires CGA-minA, and complete set-up seated using a tabletop iron    Time  12    Period  Weeks    Status  On-going    Target Date  07/28/19      OT LONG TERM GOAL  #9   Baseline  Pt. will independently be able to sew a button onto a shirt.    Time  12    Period  Weeks    Status  On-going    Target Date  07/28/19      OT LONG TERM GOAL  #10   TITLE  Pt. will independently be able to throw a ball    Baseline  Pt. continues to be limited    Time  12    Period  Weeks    Status  On-going    Target Date  07/28/19      OT LONG TERM GOAL  #11   TITLE  Pt. will improve UE functional reaching to be able to independently use his RUE to hand his clothes in the closest.    Baseline  05/05/2019: Pt. continues to be able to pull coat down, but has difficulty with getting clothes out of a taller dresser.    Time  12    Period  Weeks    Status  On-going    Target Date  07/28/19      OT LONG TERM GOAL  #12   TITLE  Pt. wil be independent with shaving using an electric razer.    Baseline  05/05/2019: Pt.'s mother  has to go back over it to ensure thoroughness.    Time  12    Period  Weeks    Status  On-going    Target Date  07/28/19  Plan - 05/10/19 1438    Clinical Impression Statement Pt. continues to make steady progress. Pt. reports that his left 3rd digit is feeling much better after having had a second injection in it.  Pt. has difficulty sustaining his RUE in elevation while manipulating objects Pt. Dropped fewer item with his  right hand, and presented with decreased compensation proximally. Pt. continues to work on improving right hand [facility] skills, and functional reaching in order to be able to each into cabinetry.     OT Occupational Profile and History  Problem Focused Assessment - Including review of records relating to presenting problem    Occupational performance deficits (Please refer to evaluation for details):  ADL's;IADL's    Body Structure / Function / Physical Skills  ADL;FMC;ROM;IADL;Pain;Coordination    Rehab Potential  Fair    Clinical Decision Making  Several treatment options, min-mod task modification necessary    OT Frequency  2x / week    OT Duration  12 weeks    OT Treatment/Interventions  Self-care/ADL training;Patient/family education;DME and/or AE instruction;Therapeutic exercise;Moist Heat;Neuromuscular education    Consulted and Agree with Plan of Care  Patient       Patient will benefit from skilled therapeutic intervention in order to improve the following deficits and impairments:   Body Structure / Function / Physical Skills: ADL, FMC, ROM, IADL, Pain, Coordination       Visit Diagnosis: Muscle weakness (generalized)  Other lack of coordination    Problem List Patient Active Problem List   Diagnosis Date Noted  . Acute CVA (cerebrovascular accident) ([place]) 05/02/2016  . Left-sided weakness 05/01/2016    Harrel Carina, MS, OTR/L 05/10/2019, 2:47 PM  [place] MAIN [facility] SERVICES [address] [place], [place], [address] Phone: [phone]   Fax:  [phone]  Name: RESEAN BRANDER MRN: [id number] Date of Birth: 1965-04-12

## 2019-05-10 NOTE — Therapy (Signed)
[place] MAIN [facility] SERVICES [address] [place], [place], [address] Phone: [phone]   Fax:  [phone]  Physical Therapy Treatment  Patient Details  Name: Curtis Powell MRN: [id number] Date of Birth: 06-Oct-1965 No data recorded  Encounter Date: 05/10/2019  PT End of Session - 05/10/19 1353    Visit Number  [id number]    Number of Visits  179    Date for PT Re-Evaluation  06/02/19    Authorization Time Period  authorized 11/16/18-05/19/19    PT Start Time  3300    PT Stop Time  1429    PT Time Calculation (min)  44 min    Equipment Utilized During Treatment  Gait belt    Activity Tolerance  Patient tolerated treatment well    Behavior During Therapy  WFL for tasks assessed/performed       Past Medical History:  Diagnosis Date  . GERD (gastroesophageal reflux disease)   . Hyperlipidemia   . Hypertension   . Obesity   . Stroke [facility])     Past Surgical History:  Procedure Laterality Date  . BRAIN SURGERY      There were no vitals filed for this visit.  Subjective Assessment - 05/10/19 1352    Subjective  Patient reports no falls or LOB since last session. Has been compliant with HEP, haven't been able to walk outside due to weather but has been walking back and forth to bedroom.    Pertinent History   Patient is a pleasant 54 year old male who presents to physical therapy for weakness and immobility secondary to CVA.  Had a stroke in Feb 2018. He was previously fully independent, but this stroke caused severe residual deficits, mainly on the right side as well as speech, and now he is unable to walk or perform most of his ADLs on his own. Entire right side is very weak. He still has a little difficulty with speech but his swallowing is improved to baseline. His mom had to move in with him and now is his main caregiver.     Limitations  Sitting;Lifting;Standing;Walking;House hold activities    How long can you sit comfortably?  5 minutes     How long can you stand comfortably?  5 minutes    How long can you walk comfortably?  10 ft    Patient Stated Goals  Walk without walker, walk further with walker, get some strength in back     Currently in Pain?  No/denies          Ther-ex  NuStep L4 x 4 minutes RPM> 60 for cardiovascular challenge.  Sit to stand from chair to RW x 10 with focus on LE movements. Slow controlled eccentric sit down and anterior pelvic press in standing for upright posture. HR elevated to 131  Seated 5lb ankle weight marches x 15 bilateral with focus on upright posture Seated clams with manual resistance from therapist x 15 bilateral; Seated adductor squeeze with manual resistance from therapist x 15 bilateral; Seated LAQ with 5lb ankle weight x 15 bilateral with 5 second holds     Therapeutic Activity  Gait on level surface x 111', x 90' with RW ( on carpet and linoleum) with CGA for safety and chair follow with min VCs to increase erect posture, increase step length and to look forward rather than down at feet. HR elevation to 133 at end due to fatigue.   Bathroom mobility with Mod I for set  up and assistance for opening/closing door.   Static standing practice x 1 minutes without UE support, he has one LOB posterior which requires him to utilize parallel bars to correct. Improved ability to shift weight forward noted today, CGA throughout for safety. Hands used for safe return to sitting   Pt educated throughout session about proper posture and technique with exercises. Improved exercise technique, movement at target joints, use of target muscles after min to mod verbal, visual, tactile cues.                       PT Education - 05/10/19 1353    Education provided  Yes    Education Details  exercise technique, body mechanics,    Person(s) Educated  Patient    Methods  Explanation;Demonstration;Tactile cues;Verbal cues    Comprehension  Verbalized understanding;Returned  demonstration;Verbal cues required;Tactile cues required       PT Short Term Goals - 04/07/19 1504      PT SHORT TERM GOAL #1   Title  Patient will be independent in home exercise program to improve strength/mobility for better functional independence with ADLs.    Baseline  hep compliant    Time  2    Period  Weeks    Status  Achieved      PT SHORT TERM GOAL #2   Title  Patient will require min cueing for STS transfer with CGA for increased independence with mobility.     Baseline  CGA    Time  2    Period  Weeks    Status  Achieved      PT SHORT TERM GOAL #3   Title  Patient will maintain upright posture for > 15 seconds to demonstrate strengthened postural control muscualture     Baseline  18 seconds     Time  2    Period  Weeks    Status  Achieved        PT Long Term Goals - 04/07/19 0001      PT LONG TERM GOAL #2   Title  Patient (< 53 years old) will complete five times sit to stand test in < 10 seconds indicating an increased LE strength and improved balance.    Baseline  12/30: 16 secpmds BUE support 02/11/19: 36 seconds 5/19: 20.6s from WC, one hand on walker and one on WC, 80% upright posture 7/1: 22 seconds one hand on walker one hand on w/c  9/14: 53 seconds with full upright posture    Time  8    Period  Weeks    Status  Partially Met    Target Date  06/02/19      PT LONG TERM GOAL #3   Title  Patient will increase Berg Balance score by > 6 points (26/56) to demonstrate decreased fall risk during functional activities.    Baseline  12/23: 25/56 9/12: 20/56 10/15: 21/56 11/7: 20/56 12/19: 22/56; 08/26/18: 11/56 7/1: 17/56  9/14: 23/56    Time  8    Period  Weeks    Status  Partially Met    Target Date  06/02/19      PT LONG TERM GOAL #4   Title  Patient will increase BLE gross strength to 4+/5 as to improve functional strength for independent gait, increased standing tolerance and increased ADL ability.    Baseline   12/30: hip flexion 4/5 hip extension and  abduction 3/5 02/11/19: hip flexion 4/5 hip extension and  abduction 2+/54-/5 RLE, 10/15: 4/5 RLE 11/7: 4/5 RLE 3/3: 4/5 7/1:  hip flexion 4/5 hip extension 2/5 hip abduction/adduciton 2/5     Time  8    Period  Weeks    Status  Partially Met    Target Date  06/02/19      PT LONG TERM GOAL #5   Title  Patient will increase lower extremity functional scale to >40/80 to demonstrate improved functional mobility and increased tolerance with ADLs.     Baseline  12/30: 35/80 02/10/19: 34/56 14/80; 12/26: 21/80; 2/18: 28/80  4/10; 25/80 5/29: 27/80; 7/16: 29/80 8/22: 30/80  9/12: 26/80 (feeling more tired due to moving) 10/15: 37/80 11/7: 23/80 3/3: 32/80; 08/25/18: 28/80 9/15: 32/56    Time  8    Period  Weeks    Status  Partially Met    Target Date  06/02/19      PT LONG TERM GOAL #6   Title  Patient will ambulate 150 ft with least assistive device and Supervision with no breaks to allow for increased mobility within home.    Baseline  12/30: unable to perform due to elevated HR 11/4: 105 ft with AFO after 10 MWT 11/7: 171f 12/19: 115 ft  3/3: will assess next session, has done 200 ft with one rest break; 08/26/18: Pt fatigued after 30' today 9/14: 100 ft with CGA    Time  8    Period  Weeks    Status  Partially Met    Target Date  06/02/19      PT LONG TERM GOAL #7   Title  Patient will perform 10 MWT in <30 seconds for improved gait speed, gait mechanics, and functional capacity for mobility.     Baseline  11/4: 56 seconds with AFO and RW 12/30: 53 seconds with AFO and RW     Time  8    Period  Weeks    Status  Partially Met    Target Date  06/02/19            Plan - 05/10/19 1418    Clinical Impression Statement  Patient presents to physical therapy with excellent motivation. Prolonged ambulation and standing interventions continue to challenge his cardiovascular system as can be seen in elevated HR resulting in rest breaks. Bathroom mobility increasing with independent with  assistance only required for door and placement. The patient continued to display excellent motivation during therapy and would benefit from further skilled PT intervention to progress towards goals.    Rehab Potential  Fair    Clinical Impairments Affecting Rehab Potential  hx of HTN, HLD, CVA, learning disability, Diabetes, brain tumor,     PT Frequency  2x / week    PT Duration  8 weeks    PT Treatment/Interventions  ADLs/Self Care Home Management;Aquatic Therapy;Ultrasound;Moist Heat;Traction;DME Instruction;Gait training;Stair training;Functional mobility training;Therapeutic activities;Therapeutic exercise;Orthotic Fit/Training;Neuromuscular re-education;Balance training;Patient/family education;Manual techniques;Wheelchair mobility training;Passive range of motion;Energy conservation;Taping;Visual/perceptual remediation/compensation    PT Next Visit Plan  balance    PT Home Exercise Plan  TrA contraction, glute sets: needs updates. Potentially some time in supine flat in hospital bed, to improve hip extension deficits.     Consulted and Agree with Plan of Care  Patient    Family Member Consulted  mother       Patient will benefit from skilled therapeutic intervention in order to improve the following deficits and impairments:  Abnormal gait, Decreased activity tolerance, Decreased balance, Decreased knowledge of precautions, Decreased endurance, Decreased  coordination, Decreased knowledge of use of DME, Decreased mobility, Decreased range of motion, Difficulty walking, Decreased safety awareness, Decreased strength, Impaired flexibility, Impaired perceived functional ability, Impaired tone, Postural dysfunction, Improper body mechanics, Pain  Visit Diagnosis: Muscle weakness (generalized)  Other lack of coordination  Hemiplegia and hemiparesis following cerebral infarction affecting right dominant side [facility])     Problem List Patient Active Problem List   Diagnosis Date Noted  .  Acute CVA (cerebrovascular accident) ([place]) 05/02/2016  . Left-sided weakness 05/01/2016   Janna Arch, PT, DPT   05/10/2019, 2:47 PM  [place] MAIN [facility] SERVICES [address] [place], [place], [address] Phone: [phone]   Fax:  [phone]  Name: DEAVEON SCHOEN MRN: [id number] Date of Birth: [birth year]

## 2019-05-12 ENCOUNTER — Other Ambulatory Visit: Payer: Self-pay

## 2019-05-12 ENCOUNTER — Ambulatory Visit: Payer: Medicare HMO

## 2019-05-12 ENCOUNTER — Encounter: Payer: Self-pay | Admitting: Occupational Therapy

## 2019-05-12 ENCOUNTER — Ambulatory Visit: Payer: Medicare HMO | Admitting: Occupational Therapy

## 2019-05-12 DIAGNOSIS — R278 Other lack of coordination: Secondary | ICD-10-CM

## 2019-05-12 DIAGNOSIS — M6281 Muscle weakness (generalized): Secondary | ICD-10-CM | POA: Diagnosis not present

## 2019-05-12 DIAGNOSIS — R531 Weakness: Secondary | ICD-10-CM

## 2019-05-12 DIAGNOSIS — I69351 Hemiplegia and hemiparesis following cerebral infarction affecting right dominant side: Secondary | ICD-10-CM

## 2019-05-12 NOTE — Therapy (Signed)
[place] MAIN [facility] SERVICES [address] [place], [place], [address] Phone: [phone]   Fax:  [phone]  Physical Therapy Treatment Physical Therapy Progress Note   Dates of reporting period  03/29/19   to   05/12/19  Patient Details  Name: Curtis Powell MRN: [id number] Date of Birth: 1965-09-09 No data recorded  Encounter Date: 05/12/2019  PT End of Session - 05/12/19 1404    Visit Number  170    Number of Visits  179    Date for PT Re-Evaluation  06/02/19    Authorization Time Period  authorized 11/16/18-05/19/19    PT Start Time  1346    PT Stop Time  1429    PT Time Calculation (min)  43 min    Equipment Utilized During Treatment  Gait belt    Activity Tolerance  Patient tolerated treatment well    Behavior During Therapy  WFL for tasks assessed/performed       Past Medical History:  Diagnosis Date  . GERD (gastroesophageal reflux disease)   . Hyperlipidemia   . Hypertension   . Obesity   . Stroke [facility])     Past Surgical History:  Procedure Laterality Date  . BRAIN SURGERY      There were no vitals filed for this visit.  Subjective Assessment - 05/12/19 1358    Subjective  Patient reports he did not sleep well last night, couldn't get warm, his legs won't cooperate with him today with them feeling "frozen", unable to stand without severe fatigue. Requests to have a lighter nonweightbearing session.    Pertinent History   Patient is a pleasant 54 year old male who presents to physical therapy for weakness and immobility secondary to CVA.  Had a stroke in Feb 2018. He was previously fully independent, but this stroke caused severe residual deficits, mainly on the right side as well as speech, and now he is unable to walk or perform most of his ADLs on his own. Entire right side is very weak. He still has a little difficulty with speech but his swallowing is improved to baseline. His mom had to move in with him and now is his  main caregiver.     Limitations  Sitting;Lifting;Standing;Walking;House hold activities    How long can you sit comfortably?  5 minutes    How long can you stand comfortably?  5 minutes    How long can you walk comfortably?  10 ft    Patient Stated Goals  Walk without walker, walk further with walker, get some strength in back     Currently in Pain?  No/denies          Pre Session Vitals: 119/ 62 pulse 88  goals deferred due to patient fatigue, not feeling well, and limited standing today.    Patient reports his legs feel "frozen" and won't work under him. Didn't sleep well last night and couldn't get warm.      Seated: 5lb ankle weight -upright march with focus on height of knee/hip flexion 10x each LE, 2 sets  -LAQ 10x 5 second holds each LE, 2 sets each LE -IR/ER with rainbow ball between knees for stabilization 12x each LE  Cross body punches 60 seconds for core activation/pertubations 3 sets.  -GTB hamstring curl 15x each LE -GTB singe leg adduction 15x each LE, 2 sets each LE  -GTB single leg eccentric press down 15x each LE   Patient's condition has the potential to  improve in response to therapy. Maximum improvement is yet to be obtained. The anticipated improvement is attainable and reasonable in a generally predictable time.  Patient reports his sit to stands are improving, wants to be able to pull up his pamper/underwear himself after using the restroom/dressing        PT Education - 05/12/19 1343    Education provided  Yes    Education Details  goals, POC    Person(s) Educated  Patient    Methods  Explanation;Demonstration;Tactile cues;Verbal cues    Comprehension  Verbalized understanding;Returned demonstration;Verbal cues required;Tactile cues required       PT Short Term Goals - 04/07/19 1504      PT SHORT TERM GOAL #1   Title  Patient will be independent in home exercise program to improve strength/mobility for better functional independence with  ADLs.    Baseline  hep compliant    Time  2    Period  Weeks    Status  Achieved      PT SHORT TERM GOAL #2   Title  Patient will require min cueing for STS transfer with CGA for increased independence with mobility.     Baseline  CGA    Time  2    Period  Weeks    Status  Achieved      PT SHORT TERM GOAL #3   Title  Patient will maintain upright posture for > 15 seconds to demonstrate strengthened postural control muscualture     Baseline  18 seconds     Time  2    Period  Weeks    Status  Achieved        PT Long Term Goals - 04/07/19 0001      PT LONG TERM GOAL #2   Title  Patient (< 51 years old) will complete five times sit to stand test in < 10 seconds indicating an increased LE strength and improved balance.    Baseline  12/30: 16 secpmds BUE support 02/11/19: 36 seconds 5/19: 20.6s from WC, one hand on walker and one on WC, 80% upright posture 7/1: 22 seconds one hand on walker one hand on w/c  9/14: 53 seconds with full upright posture    Time  8    Period  Weeks    Status  Partially Met    Target Date  06/02/19      PT LONG TERM GOAL #3   Title  Patient will increase Berg Balance score by > 6 points (26/56) to demonstrate decreased fall risk during functional activities.    Baseline  12/23: 25/56 9/12: 20/56 10/15: 21/56 11/7: 20/56 12/19: 22/56; 08/26/18: 11/56 7/1: 17/56  9/14: 23/56    Time  8    Period  Weeks    Status  Partially Met    Target Date  06/02/19      PT LONG TERM GOAL #4   Title  Patient will increase BLE gross strength to 4+/5 as to improve functional strength for independent gait, increased standing tolerance and increased ADL ability.    Baseline   12/30: hip flexion 4/5 hip extension and abduction 3/5 02/11/19: hip flexion 4/5 hip extension and abduction 2+/54-/5 RLE, 10/15: 4/5 RLE 11/7: 4/5 RLE 3/3: 4/5 7/1:  hip flexion 4/5 hip extension 2/5 hip abduction/adduciton 2/5     Time  8    Period  Weeks    Status  Partially Met    Target Date   06/02/19  PT LONG TERM GOAL #5   Title  Patient will increase lower extremity functional scale to >40/80 to demonstrate improved functional mobility and increased tolerance with ADLs.     Baseline  12/30: 35/80 02/10/19: 34/56 14/80; 12/26: 21/80; 2/18: 28/80  4/10; 25/80 5/29: 27/80; 7/16: 29/80 8/22: 30/80  9/12: 26/80 (feeling more tired due to moving) 10/15: 37/80 11/7: 23/80 3/3: 32/80; 08/25/18: 28/80 9/15: 32/56    Time  8    Period  Weeks    Status  Partially Met    Target Date  06/02/19      PT LONG TERM GOAL #6   Title  Patient will ambulate 150 ft with least assistive device and Supervision with no breaks to allow for increased mobility within home.    Baseline  12/30: unable to perform due to elevated HR 11/4: 105 ft with AFO after 10 MWT 11/7: 17f 12/19: 115 ft  3/3: will assess next session, has done 200 ft with one rest break; 08/26/18: Pt fatigued after 30' today 9/14: 100 ft with CGA    Time  8    Period  Weeks    Status  Partially Met    Target Date  06/02/19      PT LONG TERM GOAL #7   Title  Patient will perform 10 MWT in <30 seconds for improved gait speed, gait mechanics, and functional capacity for mobility.     Baseline  11/4: 56 seconds with AFO and RW 12/30: 53 seconds with AFO and RW     Time  8    Period  Weeks    Status  Partially Met    Target Date  06/02/19            Plan - 05/12/19 1406    Clinical Impression Statement  Patient's goal deferred due to patient fatigue, not feeling well, and limited standing ability today. Will attempt them next session. All interventions performed in seated this session with frequent rest breaks due to patient's fatigue. Patient's condition has the potential to improve in response to therapy. Maximum improvement is yet to be obtained. The anticipated improvement is attainable and reasonable in a generally predictable time.    Rehab Potential  Fair    Clinical Impairments Affecting Rehab Potential  hx of HTN, HLD,  CVA, learning disability, Diabetes, brain tumor,     PT Frequency  2x / week    PT Duration  8 weeks    PT Treatment/Interventions  ADLs/Self Care Home Management;Aquatic Therapy;Ultrasound;Moist Heat;Traction;DME Instruction;Gait training;Stair training;Functional mobility training;Therapeutic activities;Therapeutic exercise;Orthotic Fit/Training;Neuromuscular re-education;Balance training;Patient/family education;Manual techniques;Wheelchair mobility training;Passive range of motion;Energy conservation;Taping;Visual/perceptual remediation/compensation    PT Next Visit Plan  balance    PT Home Exercise Plan  TrA contraction, glute sets: needs updates. Potentially some time in supine flat in hospital bed, to improve hip extension deficits.     Consulted and Agree with Plan of Care  Patient    Family Member Consulted  mother       Patient will benefit from skilled therapeutic intervention in order to improve the following deficits and impairments:  Abnormal gait, Decreased activity tolerance, Decreased balance, Decreased knowledge of precautions, Decreased endurance, Decreased coordination, Decreased knowledge of use of DME, Decreased mobility, Decreased range of motion, Difficulty walking, Decreased safety awareness, Decreased strength, Impaired flexibility, Impaired perceived functional ability, Impaired tone, Postural dysfunction, Improper body mechanics, Pain  Visit Diagnosis: Muscle weakness (generalized)  Other lack of coordination  Hemiplegia and hemiparesis following cerebral infarction affecting  right dominant side ([place])  Left-sided weakness     Problem List Patient Active Problem List   Diagnosis Date Noted  . Acute CVA (cerebrovascular accident) ([place]) 05/02/2016  . Left-sided weakness 05/01/2016   Janna Arch, PT, DPT   05/12/2019, 2:32 PM  [place] MAIN [facility] SERVICES [address] [place], [place], [address] Phone: [phone]    Fax:  [phone]  Name: Curtis Powell MRN: [id number] Date of Birth: November 13, 1965

## 2019-05-12 NOTE — Therapy (Signed)
[place] MAIN [facility] SERVICES [address] [place], [place], [address] Phone: [phone]   Fax:  [phone]  Occupational Therapy Treatment  Patient Details  Name: Curtis Powell MRN: [id number] Date of Birth: 1966/02/12 No data recorded  Encounter Date: 05/12/2019  OT End of Session - 05/12/19 1456    Visit Number  [id number]    Number of Visits  185    Date for OT Re-Evaluation  07/28/19    Authorization Type  Progress reporting period starting 12/09/2018    OT Start Time  1430    OT Stop Time  1510    OT Time Calculation (min)  40 min    Activity Tolerance  Patient tolerated treatment well    Behavior During Therapy  [facility] for tasks assessed/performed       Past Medical History:  Diagnosis Date  . GERD (gastroesophageal reflux disease)   . Hyperlipidemia   . Hypertension   . Obesity   . Stroke [facility])     Past Surgical History:  Procedure Laterality Date  . BRAIN SURGERY      There were no vitals filed for this visit.  Subjective Assessment - 05/12/19 1453    Subjective   Pt. reports being tired.    Patient is accompanied by:  Family member    Pertinent History  Pt. is a 54 y.o. male who suffered a CVA on 05/01/2016. Pt. was admitted to the hospital. Once discharged, he received Home Health PT and OT services for about a month. Pt. has had multiple CVAs over the past 8 years, and has had multiple falls in the past 6 months. Pt. resides in an apartment. Pt. has caregivers for 80 hours. Pt.'s mother stays with pt. at night, and assists with IADL tasks.      Currently in Pain?  No/denies      OT TREATMENT  Therapeutic Exercise:  Pt. worked on grasping large flat shapes, and moving them through varying heights of vertical dowels. Pt. Required increased rest breaks. Minimal compensation proximally with the right shoulder, and trunk. Pt. worked on grasping, and positioning magnetic hooks on a whiteboard positioned at a vertical angle on the  tabletop. Pt. Worked on [facility] skills grasping 1", 3/4", and 1/2" flat washers, and placing them on the hooks.   Response to Treatment:  Pt. reports being very tired today. Pt. also reports having a sore throat, and a cough. Pt. reports that he wad clod last night, and could not get warm. Pt. required more rest breaks today. Pt. continues to work on improving RUE strength, and [facility] skills in order to work towards improving RUE functioning during ADLs, and IADL tasks                     OT Education - 05/12/19 1456    Education provided  Yes    Education Details  translatory skills of the hand, using the hand for storage.    Person(s) Educated  Patient    Methods  Explanation;Demonstration    Comprehension  Verbalized understanding;Returned demonstration;Verbal cues required       OT Short Term Goals - 03/03/17 1459      OT SHORT TERM GOAL #1   Title  `        OT Long Term Goals - 05/05/19 1603      OT LONG TERM GOAL #4   Title  Pt. will perform self dressing with minA and A/E as needed.  Baseline  05/05/2019 still needs help with donning underwear over feet. Min-ModA donning his jacket. (Depending on the jacket)    Time  12    Period  Weeks    Status  Partially Met    Target Date  07/28/19      OT LONG TERM GOAL #5   Title  Pt. will perform light home making tasks with minA      OT LONG TERM GOAL #6   Title  Pt. will write his name efficiently with 100% legibility    Baseline  05/05/2019: Pt continues to present with limited legibility and efficiency when writing name to sign in when riding the [place]    Time  12    Period  Weeks    Status  On-going    Target Date  07/28/19      OT LONG TERM GOAL #7   Title  Pt will independently and consistently follow HEP to increase UE strength to increase functional independence    Baseline  Pt to continues to work towards independence with exercises    Time  12    Period  Weeks    Status  On-going    Target Date   07/28/19      OT LONG TERM GOAL #8   Title  Pt. will require supervision ironing a shirt.    Baseline  05/05/2019:Pt. requires CGA-minA, and complete set-up seated using a tabletop iron    Time  12    Period  Weeks    Status  On-going    Target Date  07/28/19      OT LONG TERM GOAL  #9   Baseline  Pt. will independently be able to sew a button onto a shirt.    Time  12    Period  Weeks    Status  On-going    Target Date  07/28/19      OT LONG TERM GOAL  #10   TITLE  Pt. will independently be able to throw a ball    Baseline  Pt. continues to be limited    Time  12    Period  Weeks    Status  On-going    Target Date  07/28/19      OT LONG TERM GOAL  #11   TITLE  Pt. will improve UE functional reaching to be able to independently use his RUE to hand his clothes in the closest.    Baseline  05/05/2019: Pt. continues to be able to pull coat down, but has difficulty with getting clothes out of a taller dresser.    Time  12    Period  Weeks    Status  On-going    Target Date  07/28/19      OT LONG TERM GOAL  #12   TITLE  Pt. wil be independent with shaving using an electric razer.    Baseline  05/05/2019: Pt.'s mother  has to go back over it to ensure thoroughness.    Time  12    Period  Weeks    Status  On-going    Target Date  07/28/19            Plan - 05/12/19 1457    Clinical Impression Statement Pt. reports being very tired today. Pt. also reports having a sore throat, and a cough. Pt. reports that he wad clod last night, and could not get warm. Pt. required more rest breaks today. Pt. continues to work on improving  RUE strength, and [place] skills in order to work towards improving RUE functioning during ADLs, and IADL tasks.    OT Occupational Profile and History  Problem Focused Assessment - Including review of records relating to presenting problem    Occupational performance deficits (Please refer to evaluation for details):  ADL's;IADL's    Body Structure / Function  / Physical Skills  ADL;FMC;ROM;IADL;Pain;Coordination    Rehab Potential  Fair    Clinical Decision Making  Several treatment options, min-mod task modification necessary    OT Frequency  2x / week    OT Duration  12 weeks    OT Treatment/Interventions  Self-care/ADL training;Patient/family education;DME and/or AE instruction;Therapeutic exercise;Moist Heat;Neuromuscular education    Consulted and Agree with Plan of Care  Patient       Patient will benefit from skilled therapeutic intervention in order to improve the following deficits and impairments:   Body Structure / Function / Physical Skills: ADL, FMC, ROM, IADL, Pain, Coordination       Visit Diagnosis: Muscle weakness (generalized)  Other lack of coordination    Problem List Patient Active Problem List   Diagnosis Date Noted  . Acute CVA (cerebrovascular accident) ([place]) 05/02/2016  . Left-sided weakness 05/01/2016    Harrel Carina, MS, OTR/L 05/12/2019, 3:26 PM  [place] MAIN [facility] SERVICES [address] [place], [place], [address] Phone: [phone]   Fax:  [phone]  Name: Curtis Powell MRN: [id number] Date of Birth: 01-Jul-1965

## 2019-05-17 ENCOUNTER — Ambulatory Visit: Payer: Medicare HMO | Admitting: Occupational Therapy

## 2019-05-17 ENCOUNTER — Ambulatory Visit: Payer: Medicare HMO

## 2019-05-19 ENCOUNTER — Ambulatory Visit: Payer: Medicare HMO

## 2019-05-19 ENCOUNTER — Encounter: Payer: Medicare HMO | Admitting: Occupational Therapy

## 2019-05-24 ENCOUNTER — Ambulatory Visit: Payer: Medicare HMO

## 2019-05-24 ENCOUNTER — Ambulatory Visit: Payer: Medicare HMO | Admitting: Occupational Therapy

## 2019-05-26 ENCOUNTER — Ambulatory Visit: Payer: Medicare HMO | Admitting: Occupational Therapy

## 2019-05-26 ENCOUNTER — Ambulatory Visit: Payer: Medicare HMO

## 2019-05-31 ENCOUNTER — Other Ambulatory Visit: Payer: Self-pay

## 2019-05-31 ENCOUNTER — Encounter: Payer: Self-pay | Admitting: Occupational Therapy

## 2019-05-31 ENCOUNTER — Ambulatory Visit: Payer: Medicare HMO | Admitting: Occupational Therapy

## 2019-05-31 ENCOUNTER — Ambulatory Visit: Payer: Medicare HMO

## 2019-05-31 DIAGNOSIS — M6281 Muscle weakness (generalized): Secondary | ICD-10-CM

## 2019-05-31 DIAGNOSIS — I69351 Hemiplegia and hemiparesis following cerebral infarction affecting right dominant side: Secondary | ICD-10-CM

## 2019-05-31 DIAGNOSIS — R278 Other lack of coordination: Secondary | ICD-10-CM

## 2019-05-31 NOTE — Therapy (Addendum)
[place] MAIN [facility] SERVICES [address] [place], [place], [address] Phone: [phone]   Fax:  [phone]  Occupational Therapy Treatment  Patient Details  Name: Curtis Powell MRN: [id number] Date of Birth: Jul 07, 1965 No data recorded  Encounter Date: 05/31/2019  OT End of Session - 05/31/19 1437    Visit Number  165    Number of Visits  185    Date for OT Re-Evaluation  07/28/19    Authorization Type  Progress reporting period starting 12/09/2018    OT Start Time  1430    OT Stop Time  1515    OT Time Calculation (min)  45 min    Equipment Utilized During Treatment  Long handled shoe horn    Activity Tolerance  Patient tolerated treatment well    Behavior During Therapy  WFL for tasks assessed/performed       Past Medical History:  Diagnosis Date  . GERD (gastroesophageal reflux disease)   . Hyperlipidemia   . Hypertension   . Obesity   . Stroke [facility])     Past Surgical History:  Procedure Laterality Date  . BRAIN SURGERY      There were no vitals filed for this visit.  Subjective Assessment - 05/31/19 1436    Subjective   Pt. reports that he has not gotten all of his energy back yet.    Patient is accompanied by:  Family member    Pertinent History  Pt. is a 54 y.o. male who suffered a CVA on 05/01/2016. Pt. was admitted to the hospital. Once discharged, he received Home Health PT and OT services for about a month. Pt. has had multiple CVAs over the past 8 years, and has had multiple falls in the past 6 months. Pt. resides in an apartment. Pt. has caregivers for 80 hours. Pt.'s mother stays with pt. at night, and assists with IADL tasks.      Patient Stated Goals  To be able to throw a ball and dribble a ball, do as much as I can for myself.     Currently in Pain?  No/denies      OT TREATMENT    Neuro muscular re-education:  Pt. worked on [facility] skills grasping 1" sticks, 1/4" collars, and 1/4" washers. Pt. worked on  storing the objects in the palm, and translatory skills moving the items from the palm of the hand to the tip of the 2nd digit, and thumb.  Pt. had difficulty performing translatory movements dropping multiple pegs. Pt. Dropped less sticks when working on storing th objets. Pt. worked on removing the pegs using bilateral alternating hand patterns. Pt. Required Increased rest breaks.  Response to Treatment:  Pt. has returned for therapy after being out due to [place]. Pt. worked on tabletop [facility] tasks only since having an elevated HR with PT. Pt.'s HR 90-115 bpms during the OT session. Pt. edcuation was provided about energy conservation/work simplification strategies emphasizing balancing rest with activity. Pt. Required more cuing today, and increased time to process directions for motor/FMC movements. Pt. continues to work on improving UE strength, motor control, and [facility] skills in order to improve UE engagement in ADLs, and IADL tasks, and maximize independence.                        OT Education - 05/31/19 1436    Education provided  Yes    Education Details  translatory skills of the hand,  using the hand for storage.    Person(s) Educated  Patient    Methods  Explanation;Demonstration    Comprehension  Verbalized understanding;Returned demonstration;Verbal cues required       OT Short Term Goals - 03/03/17 1459      OT SHORT TERM GOAL #1   Title  `        OT Long Term Goals - 05/05/19 1603      OT LONG TERM GOAL #4   Title  Pt. will perform self dressing with minA and A/E as needed.    Baseline  05/05/2019 still needs help with donning underwear over feet. Min-ModA donning his jacket. (Depending on the jacket)    Time  12    Period  Weeks    Status  Partially Met    Target Date  07/28/19      OT LONG TERM GOAL #5   Title  Pt. will perform light home making tasks with minA      OT LONG TERM GOAL #6   Title  Pt. will write his name efficiently with  100% legibility    Baseline  05/05/2019: Pt continues to present with limited legibility and efficiency when writing name to sign in when riding the [place]    Time  12    Period  Weeks    Status  On-going    Target Date  07/28/19      OT LONG TERM GOAL #7   Title  Pt will independently and consistently follow HEP to increase UE strength to increase functional independence    Baseline  Pt to continues to work towards independence with exercises    Time  12    Period  Weeks    Status  On-going    Target Date  07/28/19      OT LONG TERM GOAL #8   Title  Pt. will require supervision ironing a shirt.    Baseline  05/05/2019:Pt. requires CGA-minA, and complete set-up seated using a tabletop iron    Time  12    Period  Weeks    Status  On-going    Target Date  07/28/19      OT LONG TERM GOAL  #9   Baseline  Pt. will independently be able to sew a button onto a shirt.    Time  12    Period  Weeks    Status  On-going    Target Date  07/28/19      OT LONG TERM GOAL  #10   TITLE  Pt. will independently be able to throw a ball    Baseline  Pt. continues to be limited    Time  12    Period  Weeks    Status  On-going    Target Date  07/28/19      OT LONG TERM GOAL  #11   TITLE  Pt. will improve UE functional reaching to be able to independently use his RUE to hand his clothes in the closest.    Baseline  05/05/2019: Pt. continues to be able to pull coat down, but has difficulty with getting clothes out of a taller dresser.    Time  12    Period  Weeks    Status  On-going    Target Date  07/28/19      OT LONG TERM GOAL  #12   TITLE  Pt. wil be independent with shaving using an electric razer.    Baseline  05/05/2019: Pt.'s  mother  has to go back over it to ensure thoroughness.    Time  12    Period  Weeks    Status  On-going    Target Date  07/28/19            Plan - 05/31/19 1437    Clinical Impression Statement Pt. has returned for therapy after being out due to [place]. Pt. worked on tabletop [facility] tasks only since having an elevated HR with PT. Pt.'s HR 90-115 bpms during the OT session. Pt. edcuation was provided about energy conservation/work simplification strategies emphasizing balancing rest with activity. Pt. Required more cuing today, and increased time to process directions for motor/FMC movements. Pt. continues to work on improving UE strength, motor control, and [facility] skills in order to improve UE engagement in ADLs, and IADL tasks, and maximize independence.   OT Occupational Profile and History  Problem Focused Assessment - Including review of records relating to presenting problem    Occupational performance deficits (Please refer to evaluation for details):  ADL's;IADL's    Body Structure / Function / Physical Skills  ADL;FMC;ROM;IADL;Pain;Coordination    Rehab Potential  Fair    OT Frequency  2x / week    OT Duration  12 weeks    OT Treatment/Interventions  Self-care/ADL training;Patient/family education;DME and/or AE instruction;Therapeutic exercise;Moist Heat;Neuromuscular education    Consulted and Agree with Plan of Care  Patient       Patient will benefit from skilled therapeutic intervention in order to improve the following deficits and impairments:   Body Structure / Function / Physical Skills: ADL, FMC, ROM, IADL, Pain, Coordination       Visit Diagnosis: Muscle weakness (generalized)  Other lack of coordination    Problem List Patient Active Problem List   Diagnosis Date Noted  . Acute CVA (cerebrovascular accident) ([place]) 05/02/2016  . Left-sided weakness 05/01/2016    Harrel Carina, MS, OTR/L 05/31/2019, 2:49 PM  [place] MAIN [facility] SERVICES [address] [place], [place], [address] Phone: [phone]   Fax:  [phone]  Name: Curtis Powell MRN: [id number] Date of Birth: [birth year]

## 2019-05-31 NOTE — Therapy (Deleted)
[place] MAIN [facility] SERVICES [address] [place], [place], [address] Phone: [phone]   Fax:  [phone]  Occupational Therapy Treatment  Patient Details  Name: Curtis Powell MRN: [id number] Date of Birth: [birth year] No data recorded  Encounter Date: 05/31/2019  OT End of Session - 05/31/19 1437    Visit Number  165    Number of Visits  185    Date for OT Re-Evaluation  07/28/19    Authorization Type  Progress reporting period starting 12/09/2018    OT Start Time  1430    OT Stop Time  1515    OT Time Calculation (min)  45 min    Equipment Utilized During Treatment  Long handled shoe horn    Activity Tolerance  Patient tolerated treatment well    Behavior During Therapy  WFL for tasks assessed/performed       Past Medical History:  Diagnosis Date  . GERD (gastroesophageal reflux disease)   . Hyperlipidemia   . Hypertension   . Obesity   . Stroke [facility])     Past Surgical History:  Procedure Laterality Date  . BRAIN SURGERY      There were no vitals filed for this visit.  Subjective Assessment - 05/31/19 1436    Subjective   Pt. reports that he has not gotten all of his energy back yet.    Patient is accompanied by:  Family member    Pertinent History  Pt. is a 54 y.o. male who suffered a CVA on 05/01/2016. Pt. was admitted to the hospital. Once discharged, he received Home Health PT and OT services for about a month. Pt. has had multiple CVAs over the past 8 years, and has had multiple falls in the past 6 months. Pt. resides in an apartment. Pt. has caregivers for 80 hours. Pt.'s mother stays with pt. at night, and assists with IADL tasks.      Patient Stated Goals  To be able to throw a ball and dribble a ball, do as much as I can for myself.     Currently in Pain?  No/denies                           OT Education - 05/31/19 1436    Education provided  Yes    Education Details  translatory skills of  the hand, using the hand for storage.    Person(s) Educated  Patient    Methods  Explanation;Demonstration    Comprehension  Verbalized understanding;Returned demonstration;Verbal cues required       OT Short Term Goals - 03/03/17 1459      OT SHORT TERM GOAL #1   Title  `        OT Long Term Goals - 05/05/19 1603      OT LONG TERM GOAL #4   Title  Pt. will perform self dressing with minA and A/E as needed.    Baseline  05/05/2019 still needs help with donning underwear over feet. Min-ModA donning his jacket. (Depending on the jacket)    Time  12    Period  Weeks    Status  Partially Met    Target Date  07/28/19      OT LONG TERM GOAL #5   Title  Pt. will perform light home making tasks with minA      OT LONG TERM GOAL #6   Title  Pt. will write his  name efficiently with 100% legibility    Baseline  05/05/2019: Pt continues to present with limited legibility and efficiency when writing name to sign in when riding the [place]    Time  12    Period  Weeks    Status  On-going    Target Date  07/28/19      OT LONG TERM GOAL #7   Title  Pt will independently and consistently follow HEP to increase UE strength to increase functional independence    Baseline  Pt to continues to work towards independence with exercises    Time  12    Period  Weeks    Status  On-going    Target Date  07/28/19      OT LONG TERM GOAL #8   Title  Pt. will require supervision ironing a shirt.    Baseline  05/05/2019:Pt. requires CGA-minA, and complete set-up seated using a tabletop iron    Time  12    Period  Weeks    Status  On-going    Target Date  07/28/19      OT LONG TERM GOAL  #9   Baseline  Pt. will independently be able to sew a button onto a shirt.    Time  12    Period  Weeks    Status  On-going    Target Date  07/28/19      OT LONG TERM GOAL  #10   TITLE  Pt. will independently be able to throw a ball    Baseline  Pt. continues to be limited    Time  12    Period  Weeks     Status  On-going    Target Date  07/28/19      OT LONG TERM GOAL  #11   TITLE  Pt. will improve UE functional reaching to be able to independently use his RUE to hand his clothes in the closest.    Baseline  05/05/2019: Pt. continues to be able to pull coat down, but has difficulty with getting clothes out of a taller dresser.    Time  12    Period  Weeks    Status  On-going    Target Date  07/28/19      OT LONG TERM GOAL  #12   TITLE  Pt. wil be independent with shaving using an electric razer.    Baseline  05/05/2019: Pt.'s mother  has to go back over it to ensure thoroughness.    Time  12    Period  Weeks    Status  On-going    Target Date  07/28/19            Plan - 05/31/19 1437    Clinical Impression Statement  Pt. has returned for therapy after being out due to [place]. Pt. worked on tabletop [facility] tasks only since having an elevated HR with PT. Pt.'s HR 90-115 bpms during the OT session. Pt. edcuation was provided about energy conservation/work simplification strategies emphasizing balancing rest with activity. Pt. continues to work on improving UE strength, motor control, and [facility] skills in order to improve UE engagement in ADLs, and IADL tasks, and maximize independence.    OT Occupational Profile and History  Problem Focused Assessment - Including review of records relating to presenting problem    Occupational performance deficits (Please refer to evaluation for details):  ADL's;IADL's    Body Structure / Function / Physical Skills  ADL;FMC;ROM;IADL;Pain;Coordination    Rehab  Potential  Fair    OT Frequency  2x / week    OT Duration  12 weeks    OT Treatment/Interventions  Self-care/ADL training;Patient/family education;DME and/or AE instruction;Therapeutic exercise;Moist Heat;Neuromuscular education    Consulted and Agree with Plan of Care  Patient       Patient will benefit from skilled therapeutic intervention in order to improve the following deficits and  impairments:   Body Structure / Function / Physical Skills: ADL, FMC, ROM, IADL, Pain, Coordination       Visit Diagnosis: Muscle weakness (generalized)  Other lack of coordination    Problem List Patient Active Problem List   Diagnosis Date Noted  . Acute CVA (cerebrovascular accident) ([place]) 05/02/2016  . Left-sided weakness 05/01/2016    Harrel Carina 05/31/2019, 3:59 PM  [place] MAIN [facility] SERVICES [address] [place], [place], [address] Phone: [phone]   Fax:  [phone]  Name: WANE MOLLETT MRN: [id number] Date of Birth: Feb 05, 1966

## 2019-05-31 NOTE — Therapy (Signed)
[place] MAIN [facility] SERVICES [address] [place], [place], [address] Phone: [phone]   Fax:  [phone]  Physical Therapy Treatment  Patient Details  Name: Curtis Powell MRN: [id number] Date of Birth: 09-02-1965 No data recorded  Encounter Date: 05/31/2019  PT End of Session - 05/31/19 1417    Visit Number  [id number]    Number of Visits  179    Date for PT Re-Evaluation  06/02/19    Authorization Time Period  authorized 11/16/18-05/19/19    PT Start Time  3128    PT Stop Time  1424    PT Time Calculation (min)  41 min    Equipment Utilized During Treatment  Gait belt    Activity Tolerance  Patient tolerated treatment well    Behavior During Therapy  WFL for tasks assessed/performed       Past Medical History:  Diagnosis Date  . GERD (gastroesophageal reflux disease)   . Hyperlipidemia   . Hypertension   . Obesity   . Stroke [facility])     Past Surgical History:  Procedure Laterality Date  . BRAIN SURGERY      There were no vitals filed for this visit.  Subjective Assessment - 05/31/19 1335    Subjective  Patient returning to therapy after missing the last two-three weeks due to having family member in household quarantining with COVID.    Pertinent History   Patient is a pleasant 54 year old male who presents to physical therapy for weakness and immobility secondary to CVA.  Had a stroke in Feb 2018. He was previously fully independent, but this stroke caused severe residual deficits, mainly on the right side as well as speech, and now he is unable to walk or perform most of his ADLs on his own. Entire right side is very weak. He still has a little difficulty with speech but his swallowing is improved to baseline. His mom had to move in with him and now is his main caregiver.     Limitations  Sitting;Lifting;Standing;Walking;House hold activities    How long can you sit comfortably?  5 minutes    How long can you stand comfortably?  5  minutes    How long can you walk comfortably?  10 ft    Patient Stated Goals  Walk without walker, walk further with walker, get some strength in back     Currently in Pain?  No/denies          Patient returning to therapy after missing the last two-three weeks due to having family member in household quarantining with COVID.    10 MWT: first attempt 1 min 13 seconds; HR: >110 ; would not go below 106 bpm with rest; BP taken:  119/51   LEFS:31/80  attempted seated marching: HR elevated to 112bpm  Immediately and took 4 minutes to reduce Alternating seated LE kicks HR fluctuate between 110-117 bpm Static upright posture standing with RW HR elevation to 123 bpm Seated BTB abduction ; 20x; HR maintain between 97-103 bpm Seated BTB around ankles ER/IR 15x ; HR 101-104 bpm Seated BTB hamstring curls 15x each LE   [name] called and informed of heart rate elevation, agreeable to make an appointment with physician and/or cardiologist for further checkup.      Patient returning to therapy after missing the last two-three weeks due to having family member in household quarantining with COVID. Patient's session severely limited by elevated HR. [name] called and  informed of heart rate elevation, agreeable to make an appointment with physician and/or cardiologist for further checkup. Patient denies any chest pain, headaches, or abnormal feelings.                PT Education - 05/31/19 1326    Education provided  Yes    Education Details  goals, Geophysicist/field seismologist) Educated  Patient    Methods  Explanation;Demonstration;Tactile cues;Verbal cues    Comprehension  Verbalized understanding;Returned demonstration;Verbal cues required;Tactile cues required       PT Short Term Goals - 04/07/19 1504      PT SHORT TERM GOAL #1   Title  Patient will be independent in home exercise program to improve strength/mobility for better functional independence with  ADLs.    Baseline  hep compliant    Time  2    Period  Weeks    Status  Achieved      PT SHORT TERM GOAL #2   Title  Patient will require min cueing for STS transfer with CGA for increased independence with mobility.     Baseline  CGA    Time  2    Period  Weeks    Status  Achieved      PT SHORT TERM GOAL #3   Title  Patient will maintain upright posture for > 15 seconds to demonstrate strengthened postural control muscualture     Baseline  18 seconds     Time  2    Period  Weeks    Status  Achieved        PT Long Term Goals - 04/07/19 0001      PT LONG TERM GOAL #2   Title  Patient (< 63 years old) will complete five times sit to stand test in < 10 seconds indicating an increased LE strength and improved balance.    Baseline  12/30: 16 secpmds BUE support 02/11/19: 36 seconds 5/19: 20.6s from WC, one hand on walker and one on WC, 80% upright posture 7/1: 22 seconds one hand on walker one hand on w/c  9/14: 53 seconds with full upright posture    Time  8    Period  Weeks    Status  Partially Met    Target Date  06/02/19      PT LONG TERM GOAL #3   Title  Patient will increase Berg Balance score by > 6 points (26/56) to demonstrate decreased fall risk during functional activities.    Baseline  12/23: 25/56 9/12: 20/56 10/15: 21/56 11/7: 20/56 12/19: 22/56; 08/26/18: 11/56 7/1: 17/56  9/14: 23/56    Time  8    Period  Weeks    Status  Partially Met    Target Date  06/02/19      PT LONG TERM GOAL #4   Title  Patient will increase BLE gross strength to 4+/5 as to improve functional strength for independent gait, increased standing tolerance and increased ADL ability.    Baseline   12/30: hip flexion 4/5 hip extension and abduction 3/5 02/11/19: hip flexion 4/5 hip extension and abduction 2+/54-/5 RLE, 10/15: 4/5 RLE 11/7: 4/5 RLE 3/3: 4/5 7/1:  hip flexion 4/5 hip extension 2/5 hip abduction/adduciton 2/5     Time  8    Period  Weeks    Status  Partially Met    Target Date   06/02/19      PT LONG TERM GOAL #5   Title  Patient  will increase lower extremity functional scale to >40/80 to demonstrate improved functional mobility and increased tolerance with ADLs.     Baseline  12/30: 35/80 02/10/19: 34/56 14/80; 12/26: 21/80; 2/18: 28/80  4/10; 25/80 5/29: 27/80; 7/16: 29/80 8/22: 30/80  9/12: 26/80 (feeling more tired due to moving) 10/15: 37/80 11/7: 23/80 3/3: 32/80; 08/25/18: 28/80 9/15: 32/56    Time  8    Period  Weeks    Status  Partially Met    Target Date  06/02/19      PT LONG TERM GOAL #6   Title  Patient will ambulate 150 ft with least assistive device and Supervision with no breaks to allow for increased mobility within home.    Baseline  12/30: unable to perform due to elevated HR 11/4: 105 ft with AFO after 10 MWT 11/7: 139f 12/19: 115 ft  3/3: will assess next session, has done 200 ft with one rest break; 08/26/18: Pt fatigued after 30' today 9/14: 100 ft with CGA    Time  8    Period  Weeks    Status  Partially Met    Target Date  06/02/19      PT LONG TERM GOAL #7   Title  Patient will perform 10 MWT in <30 seconds for improved gait speed, gait mechanics, and functional capacity for mobility.     Baseline  11/4: 56 seconds with AFO and RW 12/30: 53 seconds with AFO and RW     Time  8    Period  Weeks    Status  Partially Met    Target Date  06/02/19            Plan - 05/31/19 1433    Clinical Impression Statement  Patient returning to therapy after missing the last two-three weeks due to having family member in household quarantining with COVID. Patient's session severely limited by elevated HR. [name] called and informed of heart rate elevation, agreeable to make an appointment with physician and/or cardiologist for further checkup. Patient denies any chest pain, headaches, or abnormal feelings.    Rehab Potential  Fair    Clinical Impairments Affecting Rehab Potential  hx of HTN, HLD, CVA, learning disability, Diabetes, brain  tumor,     PT Frequency  2x / week    PT Duration  8 weeks    PT Treatment/Interventions  ADLs/Self Care Home Management;Aquatic Therapy;Ultrasound;Moist Heat;Traction;DME Instruction;Gait training;Stair training;Functional mobility training;Therapeutic activities;Therapeutic exercise;Orthotic Fit/Training;Neuromuscular re-education;Balance training;Patient/family education;Manual techniques;Wheelchair mobility training;Passive range of motion;Energy conservation;Taping;Visual/perceptual remediation/compensation    PT Next Visit Plan  balance    PT Home Exercise Plan  TrA contraction, glute sets: needs updates. Potentially some time in supine flat in hospital bed, to improve hip extension deficits.     Consulted and Agree with Plan of Care  Patient    Family Member Consulted  mother       Patient will benefit from skilled therapeutic intervention in order to improve the following deficits and impairments:  Abnormal gait, Decreased activity tolerance, Decreased balance, Decreased knowledge of precautions, Decreased endurance, Decreased coordination, Decreased knowledge of use of DME, Decreased mobility, Decreased range of motion, Difficulty walking, Decreased safety awareness, Decreased strength, Impaired flexibility, Impaired perceived functional ability, Impaired tone, Postural dysfunction, Improper body mechanics, Pain  Visit Diagnosis: Muscle weakness (generalized)  Other lack of coordination  Hemiplegia and hemiparesis following cerebral infarction affecting right dominant side ([facility]     Problem List Patient Active Problem List   Diagnosis Date  Noted  . Acute CVA (cerebrovascular accident) ([place]) 05/02/2016  . Left-sided weakness 05/01/2016   Janna Arch, PT, DPT   05/31/2019, 2:37 PM  [place] MAIN [facility] SERVICES [address] [place], [place], [address] Phone: [phone]   Fax:  [phone]  Name: TREK KIMBALL MRN:  [id number] Date of Birth: 10/29/1965

## 2019-06-02 ENCOUNTER — Ambulatory Visit: Payer: Medicare HMO | Admitting: Occupational Therapy

## 2019-06-02 ENCOUNTER — Ambulatory Visit: Payer: Medicare HMO

## 2019-06-02 ENCOUNTER — Encounter: Payer: Self-pay | Admitting: Occupational Therapy

## 2019-06-02 ENCOUNTER — Other Ambulatory Visit: Payer: Self-pay

## 2019-06-02 DIAGNOSIS — R278 Other lack of coordination: Secondary | ICD-10-CM

## 2019-06-02 DIAGNOSIS — M6281 Muscle weakness (generalized): Secondary | ICD-10-CM

## 2019-06-02 DIAGNOSIS — R2689 Other abnormalities of gait and mobility: Secondary | ICD-10-CM

## 2019-06-02 DIAGNOSIS — I69351 Hemiplegia and hemiparesis following cerebral infarction affecting right dominant side: Secondary | ICD-10-CM

## 2019-06-02 NOTE — Therapy (Signed)
[place] MAIN [facility] SERVICES [address] [place], [place], [address] Phone: [phone]   Fax:  [phone]  Occupational Therapy Treatment  Patient Details  Name: Curtis Powell MRN: [id number] Date of Birth: 13-Feb-1966 No data recorded  Encounter Date: 06/02/2019  OT End of Session - 06/02/19 1443    Visit Number  [id number]    Number of Visits  185    Date for OT Re-Evaluation  07/28/19    Authorization Type  Progress reporting period starting 12/09/2018    OT Start Time  1434    OT Stop Time  1515    OT Time Calculation (min)  41 min    Activity Tolerance  Patient tolerated treatment well    Behavior During Therapy  [facility] for tasks assessed/performed       Past Medical History:  Diagnosis Date  . GERD (gastroesophageal reflux disease)   . Hyperlipidemia   . Hypertension   . Obesity   . Stroke [facility])     Past Surgical History:  Procedure Laterality Date  . BRAIN SURGERY      There were no vitals filed for this visit.  Subjective Assessment - 06/02/19 1442    Subjective   Pt. reports feeling better today.    Patient is accompanied by:  Family member    Pertinent History  Pt. is a 54 y.o. male who suffered a CVA on 05/01/2016. Pt. was admitted to the hospital. Once discharged, he received Home Health PT and OT services for about a month. Pt. has had multiple CVAs over the past 8 years, and has had multiple falls in the past 6 months. Pt. resides in an apartment. Pt. has caregivers for 80 hours. Pt.'s mother stays with pt. at night, and assists with IADL tasks.      Patient Stated Goals  To be able to throw a ball and dribble a ball, do as much as I can for myself.     Currently in Pain?  No/denies      OT TREATMENT    Neuro muscular re-education:  Pt. worked on [facility] skills manipulating nuts, and bolts on a bolt board. Pt. worked on screwing, and unscrewing nuts, and bolts of varying sizes, and challenging progressively smaller items.   Pt. worked with his right hand while unscrewing the bolts from underneath the board with his vision occluded. Pt. Used his left hand to support the bolt from the top. Pt. Required moderate cues to use his right hand.  Response to Treatment:  Pt. requires cues for motor movements, and for right, and left hand discrimination when working on the task. Increased cues were required initially at the beginning of the task. Pt. required cues, and increased time to complete the task, and dropped multiple bolts. Pt. continues to work on improving right hand [facility] skills manipulating objects needed to engage his hands during ADLs, and IADL tasks.                    OT Education - 06/02/19 1442    Education provided  Yes    Education Details  translatory skills of the hand, using the hand for storage.    Person(s) Educated  Patient    Methods  Explanation;Demonstration    Comprehension  Verbalized understanding;Returned demonstration;Verbal cues required       OT Short Term Goals - 03/03/17 1459      OT SHORT TERM GOAL #1   Title  `  OT Long Term Goals - 05/05/19 1603      OT LONG TERM GOAL #4   Title  Pt. will perform self dressing with minA and A/E as needed.    Baseline  05/05/2019 still needs help with donning underwear over feet. Min-ModA donning his jacket. (Depending on the jacket)    Time  12    Period  Weeks    Status  Partially Met    Target Date  07/28/19      OT LONG TERM GOAL #5   Title  Pt. will perform light home making tasks with minA      OT LONG TERM GOAL #6   Title  Pt. will write his name efficiently with 100% legibility    Baseline  05/05/2019: Pt continues to present with limited legibility and efficiency when writing name to sign in when riding the [place]    Time  12    Period  Weeks    Status  On-going    Target Date  07/28/19      OT LONG TERM GOAL #7   Title  Pt will independently and consistently follow HEP to increase UE strength to  increase functional independence    Baseline  Pt to continues to work towards independence with exercises    Time  12    Period  Weeks    Status  On-going    Target Date  07/28/19      OT LONG TERM GOAL #8   Title  Pt. will require supervision ironing a shirt.    Baseline  05/05/2019:Pt. requires CGA-minA, and complete set-up seated using a tabletop iron    Time  12    Period  Weeks    Status  On-going    Target Date  07/28/19      OT LONG TERM GOAL  #9   Baseline  Pt. will independently be able to sew a button onto a shirt.    Time  12    Period  Weeks    Status  On-going    Target Date  07/28/19      OT LONG TERM GOAL  #10   TITLE  Pt. will independently be able to throw a ball    Baseline  Pt. continues to be limited    Time  12    Period  Weeks    Status  On-going    Target Date  07/28/19      OT LONG TERM GOAL  #11   TITLE  Pt. will improve UE functional reaching to be able to independently use his RUE to hand his clothes in the closest.    Baseline  05/05/2019: Pt. continues to be able to pull coat down, but has difficulty with getting clothes out of a taller dresser.    Time  12    Period  Weeks    Status  On-going    Target Date  07/28/19      OT LONG TERM GOAL  #12   TITLE  Pt. wil be independent with shaving using an electric razer.    Baseline  05/05/2019: Pt.'s mother  has to go back over it to ensure thoroughness.    Time  12    Period  Weeks    Status  On-going    Target Date  07/28/19            Plan - 06/02/19 1444    Clinical Impression Statement Pt. requires cues for motor movements,  and for right, and left hand discrimination when working on the task. Increased cues were required initially at the beginning of the task. Pt. required cues, and increased time to complete the task, and dropped multiple bolts. Pt. continues to work on improving right hand [facility] skills manipulating objects needed to engage his hands during ADLs, and IADL tasks.   OT  Occupational Profile and History  Problem Focused Assessment - Including review of records relating to presenting problem    Occupational performance deficits (Please refer to evaluation for details):  ADL's;IADL's    Body Structure / Function / Physical Skills  ADL;FMC;ROM;IADL;Pain;Coordination    Rehab Potential  Fair    Clinical Decision Making  Several treatment options, min-mod task modification necessary    OT Frequency  2x / week    OT Duration  12 weeks    OT Treatment/Interventions  Self-care/ADL training;Patient/family education;DME and/or AE instruction;Therapeutic exercise;Moist Heat;Neuromuscular education    Consulted and Agree with Plan of Care  Patient       Patient will benefit from skilled therapeutic intervention in order to improve the following deficits and impairments:   Body Structure / Function / Physical Skills: ADL, FMC, ROM, IADL, Pain, Coordination       Visit Diagnosis: Muscle weakness (generalized)  Other lack of coordination    Problem List Patient Active Problem List   Diagnosis Date Noted  . Acute CVA (cerebrovascular accident) ([place]) 05/02/2016  . Left-sided weakness 05/01/2016    Harrel Carina, MS, OTR/L 06/02/2019, 3:06 PM  [place] MAIN [facility] SERVICES [address] [place], [place], [address] Phone: [phone]   Fax:  [phone]  Name: OKLEY MAGNUSSEN MRN: [id number] Date of Birth: 1965-06-09

## 2019-06-02 NOTE — Therapy (Signed)
[place] MAIN [facility] SERVICES [address] [place], [place], [address] Phone: [phone]   Fax:  [phone]  Physical Therapy Treatment/ RECERT  Patient Details  Name: Curtis Powell MRN: [id number] Date of Birth: 17-Mar-1966 No data recorded  Encounter Date: 06/02/2019  PT End of Session - 06/02/19 1318    Visit Number  [id number]    Number of Visits  188    Date for PT Re-Evaluation  07/28/19    Authorization Time Period  authorized 11/16/18-05/19/19    PT Start Time  1345    PT Stop Time  1428    PT Time Calculation (min)  43 min    Equipment Utilized During Treatment  Gait belt    Activity Tolerance  Patient tolerated treatment well    Behavior During Therapy  WFL for tasks assessed/performed       Past Medical History:  Diagnosis Date  . GERD (gastroesophageal reflux disease)   . Hyperlipidemia   . Hypertension   . Obesity   . Stroke [facility])     Past Surgical History:  Procedure Laterality Date  . BRAIN SURGERY      There were no vitals filed for this visit.  Subjective Assessment - 06/02/19 1350    Subjective  Patient reports mom talked to nurse at physician office about elevated HR and was told it was due to him taking his albuterol.    Pertinent History   Patient is a pleasant 54 year old male who presents to physical therapy for weakness and immobility secondary to CVA.  Had a stroke in Feb 2018. He was previously fully independent, but this stroke caused severe residual deficits, mainly on the right side as well as speech, and now he is unable to walk or perform most of his ADLs on his own. Entire right side is very weak. He still has a little difficulty with speech but his swallowing is improved to baseline. His mom had to move in with him and now is his main caregiver.     Limitations  Sitting;Lifting;Standing;Walking;House hold activities    How long can you sit comfortably?  5 minutes    How long can you stand comfortably?  5  minutes    How long can you walk comfortably?  10 ft    Patient Stated Goals  Walk without walker, walk further with walker, get some strength in back     Currently in Pain?  No/denies         [facility] PT Assessment - 06/02/19 0001      Berg Balance Test   Sit to Stand  Able to stand  independently using hands    Standing Unsupported  Able to stand 2 minutes with supervision    Sitting with Back Unsupported but Feet Supported on Floor or Stool  Able to sit safely and securely 2 minutes    Stand to Sit  Controls descent by using hands    Transfers  Able to transfer safely, definite need of hands    Standing Unsupported with Eyes Closed  Able to stand 10 seconds with supervision    Standing Unsupported with Feet Together  Needs help to attain position but able to stand for 30 seconds with feet together    From Standing, Reach Forward with Outstretched Arm  Reaches forward but needs supervision    From Standing Position, Pick up Object from Floor  Unable to pick up shoe, but reaches 2-5 cm (1-2") from shoe  and balances independently    From Standing Position, Turn to Look Behind Over each Shoulder  Looks behind one side only/other side shows less weight shift    Turn 360 Degrees  Needs assistance while turning    Standing Unsupported, Alternately Place Feet on Step/Stool  Able to complete >2 steps/needs minimal assist    Standing Unsupported, One Foot in Front  Needs help to step but can hold 15 seconds    Standing on One Leg  Unable to try or needs assist to prevent fall    Total Score  28       Patient reports mom talked to nurse at physician office about elevated HR and was told it was due to him taking his albuterol.    Vitals at start of session 129/55 ; HR 94  Goals:  BERG: 28/56, improved by 3 points 5x STS: 19.93 seconds with one hand on walker one hand on chair  10 MWT this session: 62.5 seconds   Treat:   Mini squat x 3 trials with no UE support; able to perform for first  time  Standing with RW; marching knee to band around top front part of walker for increased hip/knee flexion, cueing for upright posture, 12x each LE   Standing with RW: hip extension/step back, visual cue of PT foot to prevent hip flexion/step forward with return to neutral foot positioning 10x each LE, max cueing for upright posture  BTB seated adduction 15x each LE against PT resistance   BTB seated abduction 15x   BTB seated marching 15x  HR monitored throughout session with occasional need for seated rest breaks to return to therapeutic range.    Patient would benefit from a trial period due to recent absence from therapy due to COVID 19 virus quarantine. Patient and his mother were sick with COVID minimizing patient's ability to perform mobility during time away from therapy. Patient agreeable to trial period and eager to continue participating in physical therapy. He demonstrates improved balance as can be seen in BERG improvement however his ambulation and transfers are slightly slower due to fatigue/out of breath.  The patient continued to display excellent motivation during therapy and would benefit from further skilled PT intervention to progress towards goals.                 PT Education - 06/02/19 1317    Education provided  Yes    Education Details  POC, body mechanics, HR monitoring, goals    Person(s) Educated  Patient    Methods  Explanation;Demonstration;Tactile cues;Verbal cues    Comprehension  Verbalized understanding;Returned demonstration;Verbal cues required;Tactile cues required       PT Short Term Goals - 04/07/19 1504      PT SHORT TERM GOAL #1   Title  Patient will be independent in home exercise program to improve strength/mobility for better functional independence with ADLs.    Baseline  hep compliant    Time  2    Period  Weeks    Status  Achieved      PT SHORT TERM GOAL #2   Title  Patient will require min cueing for STS transfer  with CGA for increased independence with mobility.     Baseline  CGA    Time  2    Period  Weeks    Status  Achieved      PT SHORT TERM GOAL #3   Title  Patient will maintain upright posture for > 15 seconds  to demonstrate strengthened postural control muscualture     Baseline  18 seconds     Time  2    Period  Weeks    Status  Achieved        PT Long Term Goals - 06/02/19 0001      PT LONG TERM GOAL #2   Title  Patient (< 33 years old) will complete five times sit to stand test in < 10 seconds indicating an increased LE strength and improved balance.    Baseline  06/02/19: 19.93 seconds one hand on RW, one hand on w/c 12/30: 16 secpmds BUE support 02/11/19: 36 seconds 5/19: 20.6s from [facility], one hand on walker and one on WC, 80% upright posture 7/1: 22 seconds one hand on walker one hand on w/c  9/14: 53 seconds with full upright posture    Time  8    Period  Weeks    Status  On-going    Target Date  07/28/19      PT LONG TERM GOAL #3   Title  Patient will increase Berg Balance score by > 6 points (26/56) to demonstrate decreased fall risk during functional activities.    Baseline  06/02/19: 28/56 12/23: 25/56 9/12: 20/56 10/15: 21/56 11/7: 20/56 12/19: 22/56; 08/26/18: 11/56 7/1: 17/56  9/14: 23/56    Time  8    Period  Weeks    Status  Partially Met    Target Date  07/28/19      PT LONG TERM GOAL #4   Title  Patient will increase BLE gross strength to 4+/5 as to improve functional strength for independent gait, increased standing tolerance and increased ADL ability.    Baseline   06/02/19: hip flexion 4-/5, extension/abd 3/5 12/30: hip flexion 4/5 hip extension and abduction 3/5 02/11/19: hip flexion 4/5 hip extension and abduction 2+/54-/5 RLE, 10/15: 4/5 RLE 11/7: 4/5 RLE 3/3: 4/5 7/1:  hip flexion 4/5 hip extension 2/5 hip abduction/adduciton 2/5     Time  8    Period  Weeks    Status  On-going    Target Date  07/28/19      PT LONG TERM GOAL #5   Title  Patient will increase  lower extremity functional scale to >40/80 to demonstrate improved functional mobility and increased tolerance with ADLs.     Baseline  06/02/19: 31/80 12/30: 35/80 02/10/19: 34/56 14/80; 12/26: 21/80; 2/18: 28/80  4/10; 25/80 5/29: 27/80; 7/16: 29/80 8/22: 30/80  9/12: 26/80 (feeling more tired due to moving) 10/15: 37/80 11/7: 23/80 3/3: 32/80; 08/25/18: 28/80 9/15: 32/56    Time  8    Period  Weeks    Status  On-going    Target Date  07/28/19      PT LONG TERM GOAL #6   Title  Patient will ambulate 150 ft with least assistive device and Supervision with no breaks to allow for increased mobility within home.    Baseline  2/24: ambulated 120 ft in previous session 12/30: unable to perform due to elevated HR 11/4: 105 ft with AFO after 10 MWT 11/7: 197f 12/19: 115 ft  3/3: will assess next session, has done 200 ft with one rest break; 08/26/18: Pt fatigued after 30' today 9/14: 100 ft with CGA    Time  8    Period  Weeks    Status  Partially Met    Target Date  07/28/19      PT LONG TERM GOAL #7  Title  Patient will perform 10 MWT in <30 seconds for improved gait speed, gait mechanics, and functional capacity for mobility.     Baseline  06/02/19: 62.5 seconds with RW 11/4: 56 seconds with AFO and RW 12/30: 53 seconds with AFO and RW     Time  8    Period  Weeks    Status  On-going    Target Date  07/28/19            Plan - 06/02/19 1438    Clinical Impression Statement  Patient would benefit from a trial period due to recent absence from therapy due to COVID 19 virus quarantine. Patient and his mother were sick with COVID minimizing patient's ability to perform mobility during time away from therapy. Patient agreeable to trial period and eager to continue participating in physical therapy. He demonstrates improved balance as can be seen in BERG improvement however his ambulation and transfers are slightly slower due to fatigue/out of breath.  The patient continued to display excellent  motivation during therapy and would benefit from further skilled PT intervention to progress towards goals.    Rehab Potential  Fair    Clinical Impairments Affecting Rehab Potential  hx of HTN, HLD, CVA, learning disability, Diabetes, brain tumor,     PT Frequency  2x / week    PT Duration  8 weeks    PT Treatment/Interventions  ADLs/Self Care Home Management;Aquatic Therapy;Ultrasound;Moist Heat;Traction;DME Instruction;Gait training;Stair training;Functional mobility training;Therapeutic activities;Therapeutic exercise;Orthotic Fit/Training;Neuromuscular re-education;Balance training;Patient/family education;Manual techniques;Wheelchair mobility training;Passive range of motion;Energy conservation;Taping;Visual/perceptual remediation/compensation    PT Next Visit Plan  balance    PT Home Exercise Plan  TrA contraction, glute sets: needs updates. Potentially some time in supine flat in hospital bed, to improve hip extension deficits.     Consulted and Agree with Plan of Care  Patient    Family Member Consulted  mother       Patient will benefit from skilled therapeutic intervention in order to improve the following deficits and impairments:  Abnormal gait, Decreased activity tolerance, Decreased balance, Decreased knowledge of precautions, Decreased endurance, Decreased coordination, Decreased knowledge of use of DME, Decreased mobility, Decreased range of motion, Difficulty walking, Decreased safety awareness, Decreased strength, Impaired flexibility, Impaired perceived functional ability, Impaired tone, Postural dysfunction, Improper body mechanics, Pain  Visit Diagnosis: Muscle weakness (generalized)  Other lack of coordination  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 Acute CVA (cerebrovascular accident) ([place])  05/02/2016  . Left-sided weakness 05/01/2016   Janna Arch, PT, DPT   06/02/2019, 2:55 PM  [place] MAIN [facility] SERVICES [address] [place], [place], [address] Phone: [phone]   Fax:  [phone]  Name: Curtis Powell MRN: [id number] Date of Birth: 1965-08-28

## 2019-06-08 ENCOUNTER — Other Ambulatory Visit: Payer: Self-pay

## 2019-06-08 ENCOUNTER — Ambulatory Visit: Payer: Medicare HMO

## 2019-06-08 ENCOUNTER — Encounter: Payer: Self-pay | Admitting: Occupational Therapy

## 2019-06-08 ENCOUNTER — Ambulatory Visit: Payer: Medicare HMO | Attending: Family Medicine | Admitting: Occupational Therapy

## 2019-06-08 DIAGNOSIS — R278 Other lack of coordination: Secondary | ICD-10-CM

## 2019-06-08 DIAGNOSIS — I69351 Hemiplegia and hemiparesis following cerebral infarction affecting right dominant side: Secondary | ICD-10-CM | POA: Insufficient documentation

## 2019-06-08 DIAGNOSIS — R2689 Other abnormalities of gait and mobility: Secondary | ICD-10-CM

## 2019-06-08 DIAGNOSIS — R531 Weakness: Secondary | ICD-10-CM | POA: Diagnosis present

## 2019-06-08 DIAGNOSIS — I639 Cerebral infarction, unspecified: Secondary | ICD-10-CM | POA: Insufficient documentation

## 2019-06-08 DIAGNOSIS — M6281 Muscle weakness (generalized): Secondary | ICD-10-CM

## 2019-06-08 NOTE — Therapy (Signed)
[place] MAIN [facility] SERVICES [address] [place], [place], [address] Phone: [phone]   Fax:  [phone]  Physical Therapy Treatment  Patient Details  Name: Curtis Powell MRN: [id number] Date of Birth: [birth year] No data recorded  Encounter Date: 06/08/2019  PT End of Session - 06/08/19 1355    Visit Number  [id number]    Number of Visits  188    Date for PT Re-Evaluation  07/28/19    PT Start Time  2542    PT Stop Time  1425    PT Time Calculation (min)  40 min    Equipment Utilized During Treatment  Gait belt    Activity Tolerance  Patient tolerated treatment well;No increased pain    Behavior During Therapy  WFL for tasks assessed/performed       Past Medical History:  Diagnosis Date  . GERD (gastroesophageal reflux disease)   . Hyperlipidemia   . Hypertension   . Obesity   . Stroke [facility])     Past Surgical History:  Procedure Laterality Date  . BRAIN SURGERY      There were no vitals filed for this visit.  Subjective Assessment - 06/08/19 1354    Subjective  Pt doing well today. Regardin gHR issues last session, patient reports mom talked to nurse at physician office about elevated HR and was told it was due to him taking his albuterol. Pt has had no cardiac symptoms.    Pertinent History   Patient is a pleasant 54 year old male who presents to physical therapy for weakness and immobility secondary to CVA.  Had a stroke in Feb 2018. He was previously fully independent, but this stroke caused severe residual deficits, mainly on the right side as well as speech, and now he is unable to walk or perform most of his ADLs on his own. Entire right side is very weak. He still has a little difficulty with speech but his swallowing is improved to baseline. His mom had to move in with him and now is his main caregiver.     Currently in Pain?  No/denies       INTERVENTION THIS DATE: *supervision to [employer] assist SPT c youth Bari RW  throughout session  Therapeutic Exercise: -Nustep, level 2, 6 minutes, Seat 8, Arms9  -At start: 98% SpO2, HR 92BPM -At 2 min: 98% SpO2, HR 109 BPM -At 4 min: 98% SpO2, HR 114 BPM -At 6 min: 98% SpO2, HR 120 BPM -2 minute recovery 98% SpO2, 96 BPM *no CP, dizziness, or SOB   -Upright unsupported standing in // bars 1x2 minutes  -Upright unsupported standing in // bars 3x2 minutes with dart board velcro activity, minA intermittently.    PT Short Term Goals - 04/07/19 1504      PT SHORT TERM GOAL #1   Title  Patient will be independent in home exercise program to improve strength/mobility for better functional independence with ADLs.    Baseline  hep compliant    Time  2    Period  Weeks    Status  Achieved      PT SHORT TERM GOAL #2   Title  Patient will require min cueing for STS transfer with CGA for increased independence with mobility.     Baseline  CGA    Time  2    Period  Weeks    Status  Achieved      PT SHORT TERM GOAL #3   Title  Patient will maintain upright posture for > 15 seconds to demonstrate strengthened postural control muscualture     Baseline  18 seconds     Time  2    Period  Weeks    Status  Achieved        PT Long Term Goals - 06/02/19 0001      PT LONG TERM GOAL #2   Title  Patient (< 56 years old) will complete five times sit to stand test in < 10 seconds indicating an increased LE strength and improved balance.    Baseline  06/02/19: 19.93 seconds one hand on RW, one hand on w/c 12/30: 16 secpmds BUE support 02/11/19: 36 seconds 5/19: 20.6s from [facility], one hand on walker and one on WC, 80% upright posture 7/1: 22 seconds one hand on walker one hand on w/c  9/14: 53 seconds with full upright posture    Time  8    Period  Weeks    Status  On-going    Target Date  07/28/19      PT LONG TERM GOAL #3   Title  Patient will increase Berg Balance score by > 6 points (26/56) to demonstrate decreased fall risk during functional activities.    Baseline   06/02/19: 28/56 12/23: 25/56 9/12: 20/56 10/15: 21/56 11/7: 20/56 12/19: 22/56; 08/26/18: 11/56 7/1: 17/56  9/14: 23/56    Time  8    Period  Weeks    Status  Partially Met    Target Date  07/28/19      PT LONG TERM GOAL #4   Title  Patient will increase BLE gross strength to 4+/5 as to improve functional strength for independent gait, increased standing tolerance and increased ADL ability.    Baseline   06/02/19: hip flexion 4-/5, extension/abd 3/5 12/30: hip flexion 4/5 hip extension and abduction 3/5 02/11/19: hip flexion 4/5 hip extension and abduction 2+/54-/5 RLE, 10/15: 4/5 RLE 11/7: 4/5 RLE 3/3: 4/5 7/1:  hip flexion 4/5 hip extension 2/5 hip abduction/adduciton 2/5     Time  8    Period  Weeks    Status  On-going    Target Date  07/28/19      PT LONG TERM GOAL #5   Title  Patient will increase lower extremity functional scale to >40/80 to demonstrate improved functional mobility and increased tolerance with ADLs.     Baseline  06/02/19: 31/80 12/30: 35/80 02/10/19: 34/56 14/80; 12/26: 21/80; 2/18: 28/80  4/10; 25/80 5/29: 27/80; 7/16: 29/80 8/22: 30/80  9/12: 26/80 (feeling more tired due to moving) 10/15: 37/80 11/7: 23/80 3/3: 32/80; 08/25/18: 28/80 9/15: 32/56    Time  8    Period  Weeks    Status  On-going    Target Date  07/28/19      PT LONG TERM GOAL #6   Title  Patient will ambulate 150 ft with least assistive device and Supervision with no breaks to allow for increased mobility within home.    Baseline  2/24: ambulated 120 ft in previous session 12/30: unable to perform due to elevated HR 11/4: 105 ft with AFO after 10 MWT 11/7: 135f 12/19: 115 ft  3/3: will assess next session, has done 200 ft with one rest break; 08/26/18: Pt fatigued after 30' today 9/14: 100 ft with CGA    Time  8    Period  Weeks    Status  Partially Met    Target Date  07/28/19  PT LONG TERM GOAL #7   Title  Patient will perform 10 MWT in <30 seconds for improved gait speed, gait mechanics, and  functional capacity for mobility.     Baseline  06/02/19: 62.5 seconds with RW 11/4: 56 seconds with AFO and RW 12/30: 53 seconds with AFO and RW     Time  8    Period  Weeks    Status  On-going    Target Date  07/28/19            Plan - 06/08/19 1356    Clinical Impression Statement  Pt able to complete entire session as planned with rest breaks provided as needed. Pt maintains high level of focus and motivation. Pt able to take part in >8 minutes of standing balance/ dual tasking actiivty, but very worn out at end of session. Overall pt continues to make steady progress toward treatment goals.    Rehab Potential  Fair    Clinical Impairments Affecting Rehab Potential  hx of HTN, HLD, CVA, learning disability, Diabetes, brain tumor,     PT Frequency  2x / week    PT Duration  8 weeks    PT Treatment/Interventions  ADLs/Self Care Home Management;Aquatic Therapy;Ultrasound;Moist Heat;Traction;DME Instruction;Gait training;Stair training;Functional mobility training;Therapeutic activities;Therapeutic exercise;Orthotic Fit/Training;Neuromuscular re-education;Balance training;Patient/family education;Manual techniques;Wheelchair mobility training;Passive range of motion;Energy conservation;Taping;Visual/perceptual remediation/compensation    PT Next Visit Plan  balance    PT Home Exercise Plan  TrA contraction, glute sets: needs updates. Potentially some time in supine flat in hospital bed, to improve hip extension deficits.     Consulted and Agree with Plan of Care  Patient       Patient will benefit from skilled therapeutic intervention in order to improve the following deficits and impairments:  Abnormal gait, Decreased activity tolerance, Decreased balance, Decreased knowledge of precautions, Decreased endurance, Decreased coordination, Decreased knowledge of use of DME, Decreased mobility, Decreased range of motion, Difficulty walking, Decreased safety awareness, Decreased strength,  Impaired flexibility, Impaired perceived functional ability, Impaired tone, Postural dysfunction, Improper body mechanics, Pain  Visit Diagnosis: Muscle weakness (generalized)  Other lack of coordination  Other abnormalities of gait and mobility     Problem List Patient Active Problem List   Diagnosis Date Noted  . Acute CVA (cerebrovascular accident) ([place]) 05/02/2016  . Left-sided weakness 05/01/2016   2:27 PM, 06/08/19 Etta Grandchild, PT, DPT Physical Therapist - [facility]  [phone] ([address])    [place] 06/08/2019, 1:58 PM  [place] MAIN [facility] SERVICES [address] [place], [place], [address] Phone: [phone]   Fax:  [phone]  Name: Curtis Powell MRN: [id number] Date of Birth: [birth year]

## 2019-06-08 NOTE — Therapy (Signed)
[place] MAIN [facility] SERVICES [address] [place], [place], [address] Phone: [phone]   Fax:  [phone]  Occupational Therapy Treatment  Patient Details  Name: Curtis Powell MRN: [id number] Date of Birth: 14-Mar-1966 No data recorded  Encounter Date: 06/08/2019  OT End of Session - 06/08/19 1308    Visit Number  [id number]    Number of Visits  185    Date for OT Re-Evaluation  07/28/19    Authorization Type  Progress reporting period starting 12/09/2018    OT Start Time  1300    OT Stop Time  1345    OT Time Calculation (min)  45 min    Equipment Utilized During Treatment  Long handled shoe horn    Activity Tolerance  Patient tolerated treatment well    Behavior During Therapy  WFL for tasks assessed/performed       Past Medical History:  Diagnosis Date  . GERD (gastroesophageal reflux disease)   . Hyperlipidemia   . Hypertension   . Obesity   . Stroke [facility])     Past Surgical History:  Procedure Laterality Date  . BRAIN SURGERY      There were no vitals filed for this visit.  Subjective Assessment - 06/08/19 1307    Subjective   Pt. reports feeling better today.    Pertinent History  Pt. is a 54 y.o. male who suffered a CVA on 05/01/2016. Pt. was admitted to the hospital. Once discharged, he received Home Health PT and OT services for about a month. Pt. has had multiple CVAs over the past 8 years, and has had multiple falls in the past 6 months. Pt. resides in an apartment. Pt. has caregivers for 80 hours. Pt.'s mother stays with pt. at night, and assists with IADL tasks.      Currently in Pain?  No/denies       OT Treatment  Therapeutic Activities:  Pt. worked on [employer] tasks following  design patterns. Pt. attempted to work on complex [employer], however pt. was unable to complete with extensive cues. The task was modified to simple Coogram designs. Pt. required cues to select the correct pieces for the design, and  create the image following the designs. Pt. required increased time, and cues for processing to perform the tasks.    Pt. reports that he got a new electric razer, however is unable to turn his chair around in his bathroom to access the plug. Pt. education was provided about locating alternative places in his home to access, and use the razer. Pt. had difficulty with completing complex Coogram designs following a design pattern. Pt. was unable to complete. Pt. requires increased verbal cues, and assist to complete simple Coogam designs. Pt. requires verbal cues, and assist to select the design patterns. Pt. continues to work on improving [facility] skills, visual processing, and  Cognitive IADL functioning in order to work towards maximizing independence with ADLs, and IADL tasks.                   OT Education - 06/08/19 1308    Education provided  Yes    Education Details  translatory skills of the hand, using the hand for storage.    Person(s) Educated  Patient    Methods  Explanation;Demonstration    Comprehension  Verbalized understanding;Returned demonstration;Verbal cues required       OT Short Term Goals - 03/03/17 1459      OT SHORT TERM GOAL #  1   Title  `        OT Long Term Goals - 05/05/19 1603      OT LONG TERM GOAL #4   Title  Pt. will perform self dressing with minA and A/E as needed.    Baseline  05/05/2019 still needs help with donning underwear over feet. Min-ModA donning his jacket. (Depending on the jacket)    Time  12    Period  Weeks    Status  Partially Met    Target Date  07/28/19      OT LONG TERM GOAL #5   Title  Pt. will perform light home making tasks with minA      OT LONG TERM GOAL #6   Title  Pt. will write his name efficiently with 100% legibility    Baseline  05/05/2019: Pt continues to present with limited legibility and efficiency when writing name to sign in when riding the [place]    Time  12    Period  Weeks    Status  On-going     Target Date  07/28/19      OT LONG TERM GOAL #7   Title  Pt will independently and consistently follow HEP to increase UE strength to increase functional independence    Baseline  Pt to continues to work towards independence with exercises    Time  12    Period  Weeks    Status  On-going    Target Date  07/28/19      OT LONG TERM GOAL #8   Title  Pt. will require supervision ironing a shirt.    Baseline  05/05/2019:Pt. requires CGA-minA, and complete set-up seated using a tabletop iron    Time  12    Period  Weeks    Status  On-going    Target Date  07/28/19      OT LONG TERM GOAL  #9   Baseline  Pt. will independently be able to sew a button onto a shirt.    Time  12    Period  Weeks    Status  On-going    Target Date  07/28/19      OT LONG TERM GOAL  #10   TITLE  Pt. will independently be able to throw a ball    Baseline  Pt. continues to be limited    Time  12    Period  Weeks    Status  On-going    Target Date  07/28/19      OT LONG TERM GOAL  #11   TITLE  Pt. will improve UE functional reaching to be able to independently use his RUE to hand his clothes in the closest.    Baseline  05/05/2019: Pt. continues to be able to pull coat down, but has difficulty with getting clothes out of a taller dresser.    Time  12    Period  Weeks    Status  On-going    Target Date  07/28/19      OT LONG TERM GOAL  #12   TITLE  Pt. wil be independent with shaving using an electric razer.    Baseline  05/05/2019: Pt.'s mother  has to go back over it to ensure thoroughness.    Time  12    Period  Weeks    Status  On-going    Target Date  07/28/19            Plan - 06/08/19  17    Clinical Impression Statement  Pt. reports that he got a new electric razer, however is unable to turn his chair around in his bathroom to access the plug. Pt. education was provided about locating alternative places in his home to access, and use the razer. Pt. had difficulty with completing complex  Coogram designs following a design pattern. Pt. was unable to complete. Pt. requires increased verbal cues, and assist to complete simple [employer] designs. Pt. requires verbal cues, and assist to select the design patterns. Pt. continues to work on improving [facility] skills, visual processing, and  Cognitive IADL functioning in order to work towards maximizing independence with ADLs, and IADL tasks.    OT Occupational Profile and History  Problem Focused Assessment - Including review of records relating to presenting problem    Occupational performance deficits (Please refer to evaluation for details):  ADL's;IADL's    Body Structure / Function / Physical Skills  ADL;FMC;ROM;IADL;Pain;Coordination    Rehab Potential  Fair    Clinical Decision Making  Several treatment options, min-mod task modification necessary    OT Frequency  2x / week    OT Duration  12 weeks    OT Treatment/Interventions  Self-care/ADL training;Patient/family education;DME and/or AE instruction;Therapeutic exercise;Moist Heat;Neuromuscular education    Consulted and Agree with Plan of Care  Patient       Patient will benefit from skilled therapeutic intervention in order to improve the following deficits and impairments:   Body Structure / Function / Physical Skills: ADL, FMC, ROM, IADL, Pain, Coordination       Visit Diagnosis: Muscle weakness (generalized)  Other lack of coordination    Problem List Patient Active Problem List   Diagnosis Date Noted  . Acute CVA (cerebrovascular accident) ([place]) 05/02/2016  . Left-sided weakness 05/01/2016    Harrel Carina, MS, OTR/L 06/08/2019, 1:39 PM  [place] MAIN [facility] SERVICES [address] [place], [place], [address] Phone: [phone]   Fax:  [phone]  Name: Curtis Powell MRN: [id number] Date of Birth: 02-16-1966

## 2019-06-10 ENCOUNTER — Ambulatory Visit: Payer: Medicare HMO

## 2019-06-10 ENCOUNTER — Other Ambulatory Visit: Payer: Self-pay

## 2019-06-10 ENCOUNTER — Encounter: Payer: Self-pay | Admitting: Occupational Therapy

## 2019-06-10 ENCOUNTER — Ambulatory Visit: Payer: Medicare HMO | Admitting: Occupational Therapy

## 2019-06-10 DIAGNOSIS — R531 Weakness: Secondary | ICD-10-CM

## 2019-06-10 DIAGNOSIS — I639 Cerebral infarction, unspecified: Secondary | ICD-10-CM

## 2019-06-10 DIAGNOSIS — I69351 Hemiplegia and hemiparesis following cerebral infarction affecting right dominant side: Secondary | ICD-10-CM

## 2019-06-10 DIAGNOSIS — R2689 Other abnormalities of gait and mobility: Secondary | ICD-10-CM

## 2019-06-10 DIAGNOSIS — R278 Other lack of coordination: Secondary | ICD-10-CM

## 2019-06-10 DIAGNOSIS — M6281 Muscle weakness (generalized): Secondary | ICD-10-CM

## 2019-06-10 NOTE — Therapy (Signed)
[place] MAIN [facility] SERVICES [address] [place], [place], [address] Phone: [phone]   Fax:  [phone]  Physical Therapy Treatment  Patient Details  Name: Curtis Powell MRN: [id number] Date of Birth: 09-Sep-1965 No data recorded  Encounter Date: 06/10/2019  PT End of Session - 06/10/19 1355    Visit Number  [id number]    Number of Visits  188    Date for PT Re-Evaluation  07/28/19    PT Start Time  1696    PT Stop Time  1425    PT Time Calculation (min)  40 min    Equipment Utilized During Treatment  Gait belt    Activity Tolerance  Patient tolerated treatment well;No increased pain    Behavior During Therapy  WFL for tasks assessed/performed       Past Medical History:  Diagnosis Date  . GERD (gastroesophageal reflux disease)   . Hyperlipidemia   . Hypertension   . Obesity   . Stroke [facility])     Past Surgical History:  Procedure Laterality Date  . BRAIN SURGERY      There were no vitals filed for this visit.  Subjective Assessment - 06/10/19 1354    Subjective  Pt doing well today. Reports to be fairly fatigued after last session, but no pain or soreness.    Pertinent History   Patient is a pleasant 54 year old male who presents to physical therapy for weakness and immobility secondary to CVA.  Had a stroke in Feb 2018. He was previously fully independent, but this stroke caused severe residual deficits, mainly on the right side as well as speech, and now he is unable to walk or perform most of his ADLs on his own. Entire right side is very weak. He still has a little difficulty with speech but his swallowing is improved to baseline. His mom had to move in with him and now is his main caregiver.     Currently in Pain?  No/denies         INTERVENTION THIS DATE: *supervision to MinGuard assist SPT c youth Bari RW throughout session   Therapeutic Exercise: -Nustep, level 2, 6 minutes, Seat 8, Arms9, SPM >90 throughout.  -AMB c  RW to table, supervision level  -Seated unsupported basketball self toss/catch 2x20  -seated single UE underhand toss to author 1x5 bilat  -seated self toss basketball on dynadisc x20 -seated dynadisc floor pass/catch to author x20   -unsupported standing x 60sec -unsupported self basketball toss/catch x3 (unsuccessful, too provocative to LOB)  -supported standing balance, RW tap c basketball FWD 1x12  *able to return to seated eccentric control hands free  -unsupported standing c RW handle taps c basketball 1x12       PT Short Term Goals - 04/07/19 1504      PT SHORT TERM GOAL #1   Title  Patient will be independent in home exercise program to improve strength/mobility for better functional independence with ADLs.    Baseline  hep compliant    Time  2    Period  Weeks    Status  Achieved      PT SHORT TERM GOAL #2   Title  Patient will require min cueing for STS transfer with CGA for increased independence with mobility.     Baseline  CGA    Time  2    Period  Weeks    Status  Achieved      PT SHORT TERM  GOAL #3   Title  Patient will maintain upright posture for > 15 seconds to demonstrate strengthened postural control muscualture     Baseline  18 seconds     Time  2    Period  Weeks    Status  Achieved        PT Long Term Goals - 06/02/19 0001      PT LONG TERM GOAL #2   Title  Patient (< 54 years old) will complete five times sit to stand test in < 10 seconds indicating an increased LE strength and improved balance.    Baseline  06/02/19: 19.93 seconds one hand on RW, one hand on w/c 12/30: 16 secpmds BUE support 02/11/19: 36 seconds 5/19: 20.6s from [facility], one hand on walker and one on WC, 80% upright posture 7/1: 22 seconds one hand on walker one hand on w/c  9/14: 53 seconds with full upright posture    Time  8    Period  Weeks    Status  On-going    Target Date  07/28/19      PT LONG TERM GOAL #3   Title  Patient will increase Berg Balance score by > 6 points  (26/56) to demonstrate decreased fall risk during functional activities.    Baseline  06/02/19: 28/56 12/23: 25/56 9/12: 20/56 10/15: 21/56 11/7: 20/56 12/19: 22/56; 08/26/18: 11/56 7/1: 17/56  9/14: 23/56    Time  8    Period  Weeks    Status  Partially Met    Target Date  07/28/19      PT LONG TERM GOAL #4   Title  Patient will increase BLE gross strength to 4+/5 as to improve functional strength for independent gait, increased standing tolerance and increased ADL ability.    Baseline   06/02/19: hip flexion 4-/5, extension/abd 3/5 12/30: hip flexion 4/5 hip extension and abduction 3/5 02/11/19: hip flexion 4/5 hip extension and abduction 2+/54-/5 RLE, 10/15: 4/5 RLE 11/7: 4/5 RLE 3/3: 4/5 7/1:  hip flexion 4/5 hip extension 2/5 hip abduction/adduciton 2/5     Time  8    Period  Weeks    Status  On-going    Target Date  07/28/19      PT LONG TERM GOAL #5   Title  Patient will increase lower extremity functional scale to >40/80 to demonstrate improved functional mobility and increased tolerance with ADLs.     Baseline  06/02/19: 31/80 12/30: 35/80 02/10/19: 34/56 14/80; 12/26: 21/80; 2/18: 28/80  4/10; 25/80 5/29: 27/80; 7/16: 29/80 8/22: 30/80  9/12: 26/80 (feeling more tired due to moving) 10/15: 37/80 11/7: 23/80 3/3: 32/80; 08/25/18: 28/80 9/15: 32/56    Time  8    Period  Weeks    Status  On-going    Target Date  07/28/19      PT LONG TERM GOAL #6   Title  Patient will ambulate 150 ft with least assistive device and Supervision with no breaks to allow for increased mobility within home.    Baseline  2/24: ambulated 120 ft in previous session 12/30: unable to perform due to elevated HR 11/4: 105 ft with AFO after 10 MWT 11/7: 113f 12/19: 115 ft  3/3: will assess next session, has done 200 ft with one rest break; 08/26/18: Pt fatigued after 30' today 9/14: 100 ft with CGA    Time  8    Period  Weeks    Status  Partially Met    Target  Date  07/28/19      PT LONG TERM GOAL #7   Title   Patient will perform 10 MWT in <30 seconds for improved gait speed, gait mechanics, and functional capacity for mobility.     Baseline  06/02/19: 62.5 seconds with RW 11/4: 56 seconds with AFO and RW 12/30: 53 seconds with AFO and RW     Time  8    Period  Weeks    Status  On-going    Target Date  07/28/19            Plan - 06/10/19 1356    Clinical Impression Statement  Conitnued to progress standing balance training. Also included some dynamic high velocity sitting balance. Fatigue is a significant factor in performance and balance control. Pt continues to make gradual and steady progress toward goals.    Rehab Potential  Fair    Clinical Impairments Affecting Rehab Potential  hx of HTN, HLD, CVA, learning disability, Diabetes, brain tumor,     PT Frequency  2x / week    PT Duration  8 weeks    PT Treatment/Interventions  ADLs/Self Care Home Management;Aquatic Therapy;Ultrasound;Moist Heat;Traction;DME Instruction;Gait training;Stair training;Functional mobility training;Therapeutic activities;Therapeutic exercise;Orthotic Fit/Training;Neuromuscular re-education;Balance training;Patient/family education;Manual techniques;Wheelchair mobility training;Passive range of motion;Energy conservation;Taping;Visual/perceptual remediation/compensation    PT Next Visit Plan  progress stnading balance    PT Home Exercise Plan  TrA contraction, glute sets: needs updates. Potentially some time in supine flat in hospital bed, to improve hip extension deficits.     Consulted and Agree with Plan of Care  Patient       Patient will benefit from skilled therapeutic intervention in order to improve the following deficits and impairments:  Abnormal gait, Decreased activity tolerance, Decreased balance, Decreased knowledge of precautions, Decreased endurance, Decreased coordination, Decreased knowledge of use of DME, Decreased mobility, Decreased range of motion, Difficulty walking, Decreased safety awareness,  Decreased strength, Impaired flexibility, Impaired perceived functional ability, Impaired tone, Postural dysfunction, Improper body mechanics, Pain  Visit Diagnosis: Muscle weakness (generalized)  Other lack of coordination  Other abnormalities of gait and mobility  Hemiplegia and hemiparesis following cerebral infarction affecting right dominant side (HCC)  Left-sided weakness  Acute CVA (cerebrovascular accident) [facility])     Problem List Patient Active Problem List   Diagnosis Date Noted  . Acute CVA (cerebrovascular accident) ([place]) 05/02/2016  . Left-sided weakness 05/01/2016   2:20 PM, 06/10/19 Etta Grandchild, PT, DPT Physical Therapist - [facility]  Outpatient Physical Therapy- [place] [facility] 06/10/2019, 1:58 PM  [place] MAIN [facility] SERVICES [address] [place], [place], [address] Phone: [phone]   Fax:  [phone]  Name: Curtis Powell MRN: [id number] Date of Birth: [birth year]

## 2019-06-10 NOTE — Therapy (Signed)
[place] MAIN [facility] SERVICES [address] [place], [place], [address] Phone: [phone]   Fax:  [phone]  Occupational Therapy Treatment  Patient Details  Name: Curtis Powell MRN: [id number] Date of Birth: August 03, 1965 No data recorded  Encounter Date: 06/10/2019  OT End of Session - 06/10/19 1445    Visit Number  [id number]    Number of Visits  185    Date for OT Re-Evaluation  07/28/19    Authorization Type  Progress reporting period starting 12/09/2018    OT Start Time  1300    OT Stop Time  1345    OT Time Calculation (min)  45 min    Activity Tolerance  Patient tolerated treatment well    Behavior During Therapy  [facility] for tasks assessed/performed       Past Medical History:  Diagnosis Date  . GERD (gastroesophageal reflux disease)   . Hyperlipidemia   . Hypertension   . Obesity   . Stroke [facility])     Past Surgical History:  Procedure Laterality Date  . BRAIN SURGERY      There were no vitals filed for this visit.  Subjective Assessment - 06/10/19 1443    Subjective   Pt. reports that he is feeling better.    Patient is accompanied by:  Family member    Pertinent History  Pt. is a 54 y.o. male who suffered a CVA on 05/01/2016. Pt. was admitted to the hospital. Once discharged, he received Home Health PT and OT services for about a month. Pt. has had multiple CVAs over the past 8 years, and has had multiple falls in the past 6 months. Pt. resides in an apartment. Pt. has caregivers for 80 hours. Pt.'s mother stays with pt. at night, and assists with IADL tasks.      Currently in Pain?  No/denies      OT TREATMENT    Neuro muscular re-education:  Pt. worked on using his right hand for grasping, and manipulating 1", 3/4", 1/2" washers from a magnetic dish using point grasp pattern. Pt. worked on reaching up, stabilizing, and sustaining shoulder elevation while placing the washer over a small precise target on vertical dowels  positioned at various angles. Pt. had difficulty with translatory movements of the hand with the flat washers, dropping multiple washers from the ulnar aspect of his palm. Pt. presented with good thumb opposition skills. Pt. worked on translatory movements with 1" circular objects, then transitioned to working on translatory movements with 1/2" raised flat marbles, and followed by 3/4" raised checkers. Pt.  dropped multiple objects from his right hand.  Response to Treatment  Pt. reports that he did not talk with his mother about finding an alternative place in his home to pplug in his electric razer to make it more accessible. Pt. continues to work on improving improving [facility] skills with reaching to various heights, and angles with the RUE. Pt. requires cues to avoid compensating, and leaning to the far left when reaching with the RUE. Pt. continues to work on improving UE strength, ROM, and [facility] skills                         OT Education - 06/10/19 1445    Education provided  Yes    Person(s) Educated  Patient    Methods  Explanation;Demonstration    Comprehension  Verbalized understanding;Returned demonstration;Verbal cues required       OT Short  Term Goals - 03/03/17 1459      OT SHORT TERM GOAL #1   Title  `        OT Long Term Goals - 05/05/19 1603      OT LONG TERM GOAL #4   Title  Pt. will perform self dressing with minA and A/E as needed.    Baseline  05/05/2019 still needs help with donning underwear over feet. Min-ModA donning his jacket. (Depending on the jacket)    Time  12    Period  Weeks    Status  Partially Met    Target Date  07/28/19      OT LONG TERM GOAL #5   Title  Pt. will perform light home making tasks with minA      OT LONG TERM GOAL #6   Title  Pt. will write his name efficiently with 100% legibility    Baseline  05/05/2019: Pt continues to present with limited legibility and efficiency when writing name to sign in when riding the  [place]    Time  12    Period  Weeks    Status  On-going    Target Date  07/28/19      OT LONG TERM GOAL #7   Title  Pt will independently and consistently follow HEP to increase UE strength to increase functional independence    Baseline  Pt to continues to work towards independence with exercises    Time  12    Period  Weeks    Status  On-going    Target Date  07/28/19      OT LONG TERM GOAL #8   Title  Pt. will require supervision ironing a shirt.    Baseline  05/05/2019:Pt. requires CGA-minA, and complete set-up seated using a tabletop iron    Time  12    Period  Weeks    Status  On-going    Target Date  07/28/19      OT LONG TERM GOAL  #9   Baseline  Pt. will independently be able to sew a button onto a shirt.    Time  12    Period  Weeks    Status  On-going    Target Date  07/28/19      OT LONG TERM GOAL  #10   TITLE  Pt. will independently be able to throw a ball    Baseline  Pt. continues to be limited    Time  12    Period  Weeks    Status  On-going    Target Date  07/28/19      OT LONG TERM GOAL  #11   TITLE  Pt. will improve UE functional reaching to be able to independently use his RUE to hand his clothes in the closest.    Baseline  05/05/2019: Pt. continues to be able to pull coat down, but has difficulty with getting clothes out of a taller dresser.    Time  12    Period  Weeks    Status  On-going    Target Date  07/28/19      OT LONG TERM GOAL  #12   TITLE  Pt. wil be independent with shaving using an electric razer.    Baseline  05/05/2019: Pt.'s mother  has to go back over it to ensure thoroughness.    Time  12    Period  Weeks    Status  On-going    Target Date  07/28/19  Plan - 06/10/19 1447    Clinical Impression Statement Pt. reports that he did not talk with his mother about finding an alternative place in his home to pplug in his electric razer to make it more accessible. Pt. continues to work on improving improving [facility] skills  with reaching to various heights, and angles with the RUE. Pt. requires cues to avoid compensating, and leaning to the far left when reaching with the RUE. Pt. continues to work on improving UE strength, ROM, and [place] skills.    OT Occupational Profile and History  Problem Focused Assessment - Including review of records relating to presenting problem    Occupational performance deficits (Please refer to evaluation for details):  ADL's;IADL's    Body Structure / Function / Physical Skills  ADL;FMC;ROM;IADL;Pain;Coordination    Rehab Potential  Fair    Clinical Decision Making  Several treatment options, min-mod task modification necessary    Comorbidities Affecting Occupational Performance:  May have comorbidities impacting occupational performance    Modification or Assistance to Complete Evaluation   Min-Moderate modification of tasks or assist with assess necessary to complete eval    OT Frequency  2x / week    OT Duration  12 weeks    OT Treatment/Interventions  Self-care/ADL training;Patient/family education;DME and/or AE instruction;Therapeutic exercise;Moist Heat;Neuromuscular education    Consulted and Agree with Plan of Care  Patient       Patient will benefit from skilled therapeutic intervention in order to improve the following deficits and impairments:   Body Structure / Function / Physical Skills: ADL, FMC, ROM, IADL, Pain, Coordination       Visit Diagnosis: Other lack of coordination  Muscle weakness (generalized)    Problem List Patient Active Problem List   Diagnosis Date Noted  . Acute CVA (cerebrovascular accident) ([place]) 05/02/2016  . Left-sided weakness 05/01/2016    Harrel Carina, MS, OTR/L 06/10/2019, 2:57 PM  [place] MAIN [facility] SERVICES [address] [place], [place], [address] Phone: [phone]   Fax:  [phone]  Name: Curtis Powell MRN: [id number] Date of Birth: May 27, 1965

## 2019-06-14 DIAGNOSIS — R159 Full incontinence of feces: Secondary | ICD-10-CM | POA: Insufficient documentation

## 2019-06-14 DIAGNOSIS — R32 Unspecified urinary incontinence: Secondary | ICD-10-CM | POA: Insufficient documentation

## 2019-06-15 ENCOUNTER — Encounter: Payer: Self-pay | Admitting: Physical Therapy

## 2019-06-15 ENCOUNTER — Other Ambulatory Visit: Payer: Self-pay

## 2019-06-15 ENCOUNTER — Encounter: Payer: Self-pay | Admitting: Occupational Therapy

## 2019-06-15 ENCOUNTER — Ambulatory Visit: Payer: Medicare HMO | Admitting: Occupational Therapy

## 2019-06-15 ENCOUNTER — Ambulatory Visit: Payer: Medicare HMO | Admitting: Physical Therapy

## 2019-06-15 DIAGNOSIS — M6281 Muscle weakness (generalized): Secondary | ICD-10-CM

## 2019-06-15 DIAGNOSIS — R278 Other lack of coordination: Secondary | ICD-10-CM

## 2019-06-15 DIAGNOSIS — I69351 Hemiplegia and hemiparesis following cerebral infarction affecting right dominant side: Secondary | ICD-10-CM

## 2019-06-15 DIAGNOSIS — R2689 Other abnormalities of gait and mobility: Secondary | ICD-10-CM

## 2019-06-15 DIAGNOSIS — R531 Weakness: Secondary | ICD-10-CM

## 2019-06-15 DIAGNOSIS — I639 Cerebral infarction, unspecified: Secondary | ICD-10-CM

## 2019-06-15 NOTE — Therapy (Addendum)
[place] MAIN [facility] SERVICES [address] [place], [place], [address] Phone: [phone]   Fax:  [phone]  Occupational Therapy Treatment  Patient Details  Name: Curtis Powell MRN: [id number] Date of Birth: 10-03-1965 No data recorded  Encounter Date: 06/15/2019  OT End of Session - 06/15/19 1338    Visit Number  [id number]    Number of Visits  209    Date for OT Re-Evaluation  07/28/19    Authorization Type  Progress reporting period starting 05/03/2019   OT Start Time  1300    OT Stop Time  1345    OT Time Calculation (min)  45 min    Activity Tolerance  Patient tolerated treatment well    Behavior During Therapy  [facility] for tasks assessed/performed       Past Medical History:  Diagnosis Date  . GERD (gastroesophageal reflux disease)   . Hyperlipidemia   . Hypertension   . Obesity   . Stroke [facility])     Past Surgical History:  Procedure Laterality Date  . BRAIN SURGERY      There were no vitals filed for this visit.  Subjective Assessment - 06/15/19 1335    Subjective   Pt. reports that he is feeling better.    Patient is accompanied by:  Family member    Pertinent History  Pt. is a 54 y.o. male who suffered a CVA on 05/01/2016. Pt. was admitted to the hospital. Once discharged, he received Home Health PT and OT services for about a month. Pt. has had multiple CVAs over the past 8 years, and has had multiple falls in the past 6 months. Pt. resides in an apartment. Pt. has caregivers for 80 hours. Pt.'s mother stays with pt. at night, and assists with IADL tasks.      Patient Stated Goals  To be able to throw a ball and dribble a ball, do as much as I can for myself.     Currently in Pain?  No/denies     OT TREATMENT    Selfcare:  Pt. worked on Estate agent tasks, and Media planner. Pt. worked on TEFL teacher tasks tracing, and copying continues lines. Pt. worked on letter, and word formation with larger printing for long words. Pt. was  able to maintain a mature grasp on the pen, and no deviation from the line. Pt. printed with 75% legibility first 75% of the words, and 25% legibility for to last 25% of the words in the task. Pt. required increased time to work on formulating sentences. Pt. had difficulty with letter, and word spacing with the sentences, deviation below the line, and 25% legibility.  Pt. reports being independent with shaving, however is unable to access the plug. Pt. has his caregivers plug in the electric razer. Pt. continues to work on Building surveyor, writing legibility, and letter formation. Pt. was able to maintain a mature grasp on the pen. Pt. requires increased time to complete the task with increased letter size. Pt. Presented with decreased writing legibility for the second half of the word list, and the sentences. Pt. continues to work on improving writing skills in order to be able to write in small spaces filling out a calendar, and planners.                        OT Education - 06/15/19 1338    Education provided  Yes    Education Details  translatory skills of  the hand, using the hand for storage.    Person(s) Educated  Patient    Methods  Explanation;Demonstration    Comprehension  Verbalized understanding;Returned demonstration;Verbal cues required       OT Short Term Goals - 03/03/17 1459      OT SHORT TERM GOAL #1   Title  `        OT Long Term Goals - 05/05/19 1603      OT LONG TERM GOAL #4   Title  Pt. will perform self dressing with minA and A/E as needed.    Baseline  05/05/2019 still needs help with donning underwear over feet. Min-ModA donning his jacket. (Depending on the jacket)    Time  12    Period  Weeks    Status  Partially Met    Target Date  07/28/19      OT LONG TERM GOAL #5   Title  Pt. will perform light home making tasks with minA      OT LONG TERM GOAL #6   Title  Pt. will write his name efficiently with 100% legibility    Baseline   05/05/2019: Pt continues to present with limited legibility and efficiency when writing name to sign in when riding the [place]    Time  12    Period  Weeks    Status  On-going    Target Date  07/28/19      OT LONG TERM GOAL #7   Title  Pt will independently and consistently follow HEP to increase UE strength to increase functional independence    Baseline  Pt to continues to work towards independence with exercises    Time  12    Period  Weeks    Status  On-going    Target Date  07/28/19      OT LONG TERM GOAL #8   Title  Pt. will require supervision ironing a shirt.    Baseline  05/05/2019:Pt. requires CGA-minA, and complete set-up seated using a tabletop iron    Time  12    Period  Weeks    Status  On-going    Target Date  07/28/19      OT LONG TERM GOAL  #9   Baseline  Pt. will independently be able to sew a button onto a shirt.    Time  12    Period  Weeks    Status  On-going    Target Date  07/28/19      OT LONG TERM GOAL  #10   TITLE  Pt. will independently be able to throw a ball    Baseline  Pt. continues to be limited    Time  12    Period  Weeks    Status  On-going    Target Date  07/28/19      OT LONG TERM GOAL  #11   TITLE  Pt. will improve UE functional reaching to be able to independently use his RUE to hand his clothes in the closest.    Baseline  05/05/2019: Pt. continues to be able to pull coat down, but has difficulty with getting clothes out of a taller dresser.    Time  12    Period  Weeks    Status  On-going    Target Date  07/28/19      OT LONG TERM GOAL  #12   TITLE  Pt. wil be independent with shaving using an electric razer.    Baseline  05/05/2019: Pt.'s mother  has to go back over it to ensure thoroughness.    Time  12    Period  Weeks    Status  On-going    Target Date  07/28/19            Plan - 06/15/19 1340    Clinical Impression Statement Pt. reports being independent with shaving, however is unable to access the plug. Pt. has his  caregivers plug in the electric razer. Pt. continues to work on Building surveyor, writing legibility, and letter formation. Pt. was able to maintain a mature grasp on the pen. Pt. requires increased time to complete the task with increased letter size. Pt. Presented with decreased writing legibility for the second half of the word list, and the sentences. Pt. continues to work on improving writing skills in order to be able to write in small spaces filling out a calendar, and planners.    OT Occupational Profile and History  Problem Focused Assessment - Including review of records relating to presenting problem    Occupational performance deficits (Please refer to evaluation for details):  ADL's;IADL's    Body Structure / Function / Physical Skills  ADL;FMC;ROM;IADL;Pain;Coordination    Rehab Potential  Fair    Clinical Decision Making  Several treatment options, min-mod task modification necessary    Comorbidities Affecting Occupational Performance:  May have comorbidities impacting occupational performance    Modification or Assistance to Complete Evaluation   Min-Moderate modification of tasks or assist with assess necessary to complete eval    OT Frequency  2x / week    OT Duration  12 weeks    OT Treatment/Interventions  Self-care/ADL training;Patient/family education;DME and/or AE instruction;Therapeutic exercise;Moist Heat;Neuromuscular education    Consulted and Agree with Plan of Care  Patient       Patient will benefit from skilled therapeutic intervention in order to improve the following deficits and impairments:   Body Structure / Function / Physical Skills: ADL, FMC, ROM, IADL, Pain, Coordination       Visit Diagnosis: Muscle weakness (generalized)  Other lack of coordination    Problem List Patient Active Problem List   Diagnosis Date Noted  . Acute CVA (cerebrovascular accident) ([place]) 05/02/2016  . Left-sided weakness 05/01/2016    Harrel Carina, MS,  OTR/L 06/15/2019, 1:53 PM  [place] MAIN [facility] SERVICES [address] [place], [place], [address] Phone: [phone]   Fax:  [phone]  Name: Curtis Powell MRN: [id number] Date of Birth: [birth year]

## 2019-06-15 NOTE — Therapy (Addendum)
[place] MAIN [facility] SERVICES [address] [place], [place], [address] Phone: [phone]   Fax:  [phone]  Physical Therapy Treatment  Patient Details  Name: Curtis Powell MRN: [id number] Date of Birth: [birth year] No data recorded  Encounter Date: 06/15/2019  PT End of Session - 06/15/19 1424    Visit Number  [id number]    Number of Visits  188    Date for PT Re-Evaluation  07/28/19    PT Start Time  0215    PT Stop Time  0300    PT Time Calculation (min)  45 min    Equipment Utilized During Treatment  Gait belt    Activity Tolerance  Patient tolerated treatment well;No increased pain    Behavior During Therapy  WFL for tasks assessed/performed       Past Medical History:  Diagnosis Date  . GERD (gastroesophageal reflux disease)   . Hyperlipidemia   . Hypertension   . Obesity   . Stroke [facility])     Past Surgical History:  Procedure Laterality Date  . BRAIN SURGERY      There were no vitals filed for this visit.  Subjective Assessment - 06/15/19 1423    Subjective  Pt doing well today, but no pain or soreness.    Pertinent History   Patient is a pleasant 54 year old male who presents to physical therapy for weakness and immobility secondary to CVA.  Had a stroke in Feb 2018. He was previously fully independent, but this stroke caused severe residual deficits, mainly on the right side as well as speech, and now he is unable to walk or perform most of his ADLs on his own. Entire right side is very weak. He still has a little difficulty with speech but his swallowing is improved to baseline. His mom had to move in with him and now is his main caregiver.     Limitations  Sitting;Lifting;Standing;Walking;House hold activities    How long can you sit comfortably?  5 minutes    How long can you stand comfortably?  5 minutes    How long can you walk comfortably?  10 ft    Patient Stated Goals  Walk without walker, walk further with walker, get  some strength in back     Currently in Pain?  No/denies    Pain Score  0-No pain    Pain Onset  More than a month ago       Treatment: Nu- step x 5 mins L 3 SAQ with 3 lbs and 3 sec hold x 15  Hooklying marching with 3 lbs , BLE Heel slides x 15 BLE SLR x 5 x 2 BLE Gait training with RW and SBA 100 feet with wide base of support and lateral rock, decreased knee flex bilaterally     Pt educated throughout session about proper posture and technique with exercises. Improved exercise technique, movement at target joints, use of target muscles after min to mod verbal, visual, tactile cues.                     PT Education - 06/15/19 1424    Education provided  Yes    Education Details  HEP    Person(s) Educated  Patient    Methods  Explanation    Comprehension  Verbalized understanding;Need further instruction       PT Short Term Goals - 04/07/19 1504      PT SHORT TERM  GOAL #1   Title  Patient will be independent in home exercise program to improve strength/mobility for better functional independence with ADLs.    Baseline  hep compliant    Time  2    Period  Weeks    Status  Achieved      PT SHORT TERM GOAL #2   Title  Patient will require min cueing for STS transfer with CGA for increased independence with mobility.     Baseline  CGA    Time  2    Period  Weeks    Status  Achieved      PT SHORT TERM GOAL #3   Title  Patient will maintain upright posture for > 15 seconds to demonstrate strengthened postural control muscualture     Baseline  18 seconds     Time  2    Period  Weeks    Status  Achieved        PT Long Term Goals - 06/02/19 0001      PT LONG TERM GOAL #2   Title  Patient (< 35 years old) will complete five times sit to stand test in < 10 seconds indicating an increased LE strength and improved balance.    Baseline  06/02/19: 19.93 seconds one hand on RW, one hand on w/c 12/30: 16 secpmds BUE support 02/11/19: 36 seconds 5/19: 20.6s  from [facility], one hand on walker and one on WC, 80% upright posture 7/1: 22 seconds one hand on walker one hand on w/c  9/14: 53 seconds with full upright posture    Time  8    Period  Weeks    Status  On-going    Target Date  07/28/19      PT LONG TERM GOAL #3   Title  Patient will increase Berg Balance score by > 6 points (26/56) to demonstrate decreased fall risk during functional activities.    Baseline  06/02/19: 28/56 12/23: 25/56 9/12: 20/56 10/15: 21/56 11/7: 20/56 12/19: 22/56; 08/26/18: 11/56 7/1: 17/56  9/14: 23/56    Time  8    Period  Weeks    Status  Partially Met    Target Date  07/28/19      PT LONG TERM GOAL #4   Title  Patient will increase BLE gross strength to 4+/5 as to improve functional strength for independent gait, increased standing tolerance and increased ADL ability.    Baseline   06/02/19: hip flexion 4-/5, extension/abd 3/5 12/30: hip flexion 4/5 hip extension and abduction 3/5 02/11/19: hip flexion 4/5 hip extension and abduction 2+/54-/5 RLE, 10/15: 4/5 RLE 11/7: 4/5 RLE 3/3: 4/5 7/1:  hip flexion 4/5 hip extension 2/5 hip abduction/adduciton 2/5     Time  8    Period  Weeks    Status  On-going    Target Date  07/28/19      PT LONG TERM GOAL #5   Title  Patient will increase lower extremity functional scale to >40/80 to demonstrate improved functional mobility and increased tolerance with ADLs.     Baseline  06/02/19: 31/80 12/30: 35/80 02/10/19: 34/56 14/80; 12/26: 21/80; 2/18: 28/80  4/10; 25/80 5/29: 27/80; 7/16: 29/80 8/22: 30/80  9/12: 26/80 (feeling more tired due to moving) 10/15: 37/80 11/7: 23/80 3/3: 32/80; 08/25/18: 28/80 9/15: 32/56    Time  8    Period  Weeks    Status  On-going    Target Date  07/28/19      PT  LONG TERM GOAL #6   Title  Patient will ambulate 150 ft with least assistive device and Supervision with no breaks to allow for increased mobility within home.    Baseline  2/24: ambulated 120 ft in previous session 12/30: unable to perform due  to elevated HR 11/4: 105 ft with AFO after 10 MWT 11/7: 162f 12/19: 115 ft  3/3: will assess next session, has done 200 ft with one rest break; 08/26/18: Pt fatigued after 30' today 9/14: 100 ft with CGA    Time  8    Period  Weeks    Status  Partially Met    Target Date  07/28/19      PT LONG TERM GOAL #7   Title  Patient will perform 10 MWT in <30 seconds for improved gait speed, gait mechanics, and functional capacity for mobility.     Baseline  06/02/19: 62.5 seconds with RW 11/4: 56 seconds with AFO and RW 12/30: 53 seconds with AFO and RW     Time  8    Period  Weeks    Status  On-going    Target Date  07/28/19            Plan - 06/15/19 1435    Clinical Impression Statement   Pt was able to perform all exercises today with CGA.after set up . Pt requires verbal, visual and tactile cues during exercise in order to complete tasks with proper form and technique.  Pt would continue to benefit from skilled PT services in order to further strengthen LE's, improve static and dynamic balance, and improve coordination in order to increase functional mobility and decrease risk of falls   Rehab Potential  Fair    Clinical Impairments Affecting Rehab Potential  hx of HTN, HLD, CVA, learning disability, Diabetes, brain tumor,     PT Frequency  2x / week    PT Duration  8 weeks    PT Treatment/Interventions  ADLs/Self Care Home Management;Aquatic Therapy;Ultrasound;Moist Heat;Traction;DME Instruction;Gait training;Stair training;Functional mobility training;Therapeutic activities;Therapeutic exercise;Orthotic Fit/Training;Neuromuscular re-education;Balance training;Patient/family education;Manual techniques;Wheelchair mobility training;Passive range of motion;Energy conservation;Taping;Visual/perceptual remediation/compensation    PT Next Visit Plan  progress stnading balance    PT Home Exercise Plan  TrA contraction, glute sets: needs updates. Potentially some time in supine flat in hospital  bed, to improve hip extension deficits.     Consulted and Agree with Plan of Care  Patient       Patient will benefit from skilled therapeutic intervention in order to improve the following deficits and impairments:  Abnormal gait, Decreased activity tolerance, Decreased balance, Decreased knowledge of precautions, Decreased endurance, Decreased coordination, Decreased knowledge of use of DME, Decreased mobility, Decreased range of motion, Difficulty walking, Decreased safety awareness, Decreased strength, Impaired flexibility, Impaired perceived functional ability, Impaired tone, Postural dysfunction, Improper body mechanics, Pain  Visit Diagnosis: Muscle weakness (generalized)  Other lack of coordination  Other abnormalities of gait and mobility  Hemiplegia and hemiparesis following cerebral infarction affecting right dominant side (HCC)  Left-sided weakness  Acute CVA (cerebrovascular accident) ([facility]     Problem List Patient Active Problem List   Diagnosis Date Noted  . Acute CV[place] ([place]bro[place]) ([place] 05/02/2016  . Left-sided weakness 05/01/2016    MAlanson Puls PVirginiaDPT 06/15/2019, 2:36 PM  [place] [facility] [address] [address]Phone: [phone]  Fax:  [phone] Name: Curtis MENDOLIAMRN: [id number] of Birth: 902/06/1965

## 2019-06-17 ENCOUNTER — Encounter: Payer: Self-pay | Admitting: Occupational Therapy

## 2019-06-17 ENCOUNTER — Encounter: Payer: Self-pay | Admitting: Physical Therapy

## 2019-06-17 ENCOUNTER — Ambulatory Visit: Payer: Medicare HMO | Admitting: Physical Therapy

## 2019-06-17 ENCOUNTER — Ambulatory Visit: Payer: Medicare HMO | Admitting: Occupational Therapy

## 2019-06-17 ENCOUNTER — Other Ambulatory Visit: Payer: Self-pay

## 2019-06-17 DIAGNOSIS — R2689 Other abnormalities of gait and mobility: Secondary | ICD-10-CM

## 2019-06-17 DIAGNOSIS — M6281 Muscle weakness (generalized): Secondary | ICD-10-CM

## 2019-06-17 DIAGNOSIS — R278 Other lack of coordination: Secondary | ICD-10-CM

## 2019-06-17 DIAGNOSIS — R531 Weakness: Secondary | ICD-10-CM

## 2019-06-17 DIAGNOSIS — I639 Cerebral infarction, unspecified: Secondary | ICD-10-CM

## 2019-06-17 DIAGNOSIS — I69351 Hemiplegia and hemiparesis following cerebral infarction affecting right dominant side: Secondary | ICD-10-CM

## 2019-06-17 NOTE — Therapy (Signed)
[place] MAIN [facility] SERVICES [address] [place], [place], [address] Phone: [phone]   Fax:  [phone]  Physical Therapy Treatment  Patient Details  Name: Curtis Powell MRN: [id number] Date of Birth: [birth year] No data recorded  Encounter Date: 06/17/2019  PT End of Session - 06/17/19 1636    Visit Number  [id number]    Number of Visits  188    Date for PT Re-Evaluation  07/28/19    PT Start Time  0150    PT Stop Time  0230    PT Time Calculation (min)  40 min    Equipment Utilized During Treatment  Gait belt    Activity Tolerance  Patient tolerated treatment well;No increased pain    Behavior During Therapy  WFL for tasks assessed/performed       Past Medical History:  Diagnosis Date  . GERD (gastroesophageal reflux disease)   . Hyperlipidemia   . Hypertension   . Obesity   . Stroke [facility])     Past Surgical History:  Procedure Laterality Date  . BRAIN SURGERY      There were no vitals filed for this visit.  Subjective Assessment - 06/17/19 1634    Subjective  Pt doing well today, but no pain or soreness.    Pertinent History   Patient is a pleasant 54 year old male who presents to physical therapy for weakness and immobility secondary to CVA.  Had a stroke in Feb 2018. He was previously fully independent, but this stroke caused severe residual deficits, mainly on the right side as well as speech, and now he is unable to walk or perform most of his ADLs on his own. Entire right side is very weak. He still has a little difficulty with speech but his swallowing is improved to baseline. His mom had to move in with him and now is his main caregiver.     Limitations  Sitting;Lifting;Standing;Walking;House hold activities    How long can you sit comfortably?  5 minutes    How long can you stand comfortably?  5 minutes    How long can you walk comfortably?  10 ft    Patient Stated Goals  Walk without walker, walk further with walker,  get some strength in back     Currently in Pain?  No/denies    Pain Score  0-No pain    Pain Onset  More than a month ago      Patient ambulates with RW 75 feet x 2 with 1 seated rest period and RW and lateral sway with decreased knee flex and hip flex. Therapeutic exercises Nu-step x 5 mins , L 3 Seated LAQ with 5 lbs x 20x 3 sets BLE Seated marching with 5 lbs x 20 x 2 BLE Pt educated throughout session about proper posture and technique with exercises. Improved exercise technique, movement at target joints, use of target muscles after min to mod verbal, visual, tactile cues. Patient performed with instruction, verbal cues, tactile cues of therapist: goal: increase tissue extensibility, promote proper posture, improve mobility                         PT Education - 06/17/19 1634    Education provided  Yes    Education Details  HEP, safety    Person(s) Educated  Patient    Methods  Explanation;Demonstration;Verbal cues    Comprehension  Verbalized understanding;Returned demonstration;Need further instruction  PT Short Term Goals - 04/07/19 1504      PT SHORT TERM GOAL #1   Title  Patient will be independent in home exercise program to improve strength/mobility for better functional independence with ADLs.    Baseline  hep compliant    Time  2    Period  Weeks    Status  Achieved      PT SHORT TERM GOAL #2   Title  Patient will require min cueing for STS transfer with CGA for increased independence with mobility.     Baseline  CGA    Time  2    Period  Weeks    Status  Achieved      PT SHORT TERM GOAL #3   Title  Patient will maintain upright posture for > 15 seconds to demonstrate strengthened postural control muscualture     Baseline  18 seconds     Time  2    Period  Weeks    Status  Achieved        PT Long Term Goals - 06/02/19 0001      PT LONG TERM GOAL #2   Title  Patient (< 77 years old) will complete five times sit to stand test  in < 10 seconds indicating an increased LE strength and improved balance.    Baseline  06/02/19: 19.93 seconds one hand on RW, one hand on w/c 12/30: 16 secpmds BUE support 02/11/19: 36 seconds 5/19: 20.6s from [facility], one hand on walker and one on WC, 80% upright posture 7/1: 22 seconds one hand on walker one hand on w/c  9/14: 53 seconds with full upright posture    Time  8    Period  Weeks    Status  On-going    Target Date  07/28/19      PT LONG TERM GOAL #3   Title  Patient will increase Berg Balance score by > 6 points (26/56) to demonstrate decreased fall risk during functional activities.    Baseline  06/02/19: 28/56 12/23: 25/56 9/12: 20/56 10/15: 21/56 11/7: 20/56 12/19: 22/56; 08/26/18: 11/56 7/1: 17/56  9/14: 23/56    Time  8    Period  Weeks    Status  Partially Met    Target Date  07/28/19      PT LONG TERM GOAL #4   Title  Patient will increase BLE gross strength to 4+/5 as to improve functional strength for independent gait, increased standing tolerance and increased ADL ability.    Baseline   06/02/19: hip flexion 4-/5, extension/abd 3/5 12/30: hip flexion 4/5 hip extension and abduction 3/5 02/11/19: hip flexion 4/5 hip extension and abduction 2+/54-/5 RLE, 10/15: 4/5 RLE 11/7: 4/5 RLE 3/3: 4/5 7/1:  hip flexion 4/5 hip extension 2/5 hip abduction/adduciton 2/5     Time  8    Period  Weeks    Status  On-going    Target Date  07/28/19      PT LONG TERM GOAL #5   Title  Patient will increase lower extremity functional scale to >40/80 to demonstrate improved functional mobility and increased tolerance with ADLs.     Baseline  06/02/19: 31/80 12/30: 35/80 02/10/19: 34/56 14/80; 12/26: 21/80; 2/18: 28/80  4/10; 25/80 5/29: 27/80; 7/16: 29/80 8/22: 30/80  9/12: 26/80 (feeling more tired due to moving) 10/15: 37/80 11/7: 23/80 3/3: 32/80; 08/25/18: 28/80 9/15: 32/56    Time  8    Period  Weeks    Status  On-going    Target Date  07/28/19      PT LONG TERM GOAL #6   Title  Patient will  ambulate 150 ft with least assistive device and Supervision with no breaks to allow for increased mobility within home.    Baseline  2/24: ambulated 120 ft in previous session 12/30: unable to perform due to elevated HR 11/4: 105 ft with AFO after 10 MWT 11/7: 170f 12/19: 115 ft  3/3: will assess next session, has done 200 ft with one rest break; 08/26/18: Pt fatigued after 30' today 9/14: 100 ft with CGA    Time  8    Period  Weeks    Status  Partially Met    Target Date  07/28/19      PT LONG TERM GOAL #7   Title  Patient will perform 10 MWT in <30 seconds for improved gait speed, gait mechanics, and functional capacity for mobility.     Baseline  06/02/19: 62.5 seconds with RW 11/4: 56 seconds with AFO and RW 12/30: 53 seconds with AFO and RW     Time  8    Period  Weeks    Status  On-going    Target Date  07/28/19            Plan - 06/17/19 1636    Clinical Impression Statement  Patient instructed in intermediate mobility challenges, transfer training and safety training. Patient required min-mod VCs for correct positioning; Patient had difficulty with picking up his feet with knee and hip flexion during gait.   Patient would benefit from additional skilled PT intervention to improve strength, balance.   Rehab Potential  Fair    Clinical Impairments Affecting Rehab Potential  hx of HTN, HLD, CVA, learning disability, Diabetes, brain tumor,     PT Frequency  2x / week    PT Duration  8 weeks    PT Treatment/Interventions  ADLs/Self Care Home Management;Aquatic Therapy;Ultrasound;Moist Heat;Traction;DME Instruction;Gait training;Stair training;Functional mobility training;Therapeutic activities;Therapeutic exercise;Orthotic Fit/Training;Neuromuscular re-education;Balance training;Patient/family education;Manual techniques;Wheelchair mobility training;Passive range of motion;Energy conservation;Taping;Visual/perceptual remediation/compensation    PT Next Visit Plan  progress stnading  balance    PT Home Exercise Plan  TrA contraction, glute sets: needs updates. Potentially some time in supine flat in hospital bed, to improve hip extension deficits.     Consulted and Agree with Plan of Care  Patient       Patient will benefit from skilled therapeutic intervention in order to improve the following deficits and impairments:  Abnormal gait, Decreased activity tolerance, Decreased balance, Decreased knowledge of precautions, Decreased endurance, Decreased coordination, Decreased knowledge of use of DME, Decreased mobility, Decreased range of motion, Difficulty walking, Decreased safety awareness, Decreased strength, Impaired flexibility, Impaired perceived functional ability, Impaired tone, Postural dysfunction, Improper body mechanics, Pain  Visit Diagnosis: Muscle weakness (generalized)  Other lack of coordination  Other abnormalities of gait and mobility  Hemiplegia and hemiparesis following cerebral infarction affecting right dominant side (HCC)  Left-sided weakness  Acute CVA (cerebrovascular accident) ([facility]     Problem List Patient Active Problem List   Diagnosis Date Noted  . [place]  . Left-sided weakness 05/01/2016    MAlanson Puls PVirginiaDPT 06/17/2019, 4:37 PM  [place] [address] [address]Phone: [phone]  Fax:  [phone] Name: BAJAMU MAXONMRN: [id number] of Birth: 910-Jul-1967

## 2019-06-17 NOTE — Therapy (Signed)
[place] MAIN [facility] SERVICES [address] [place], [place], [address] Phone: [phone]   Fax:  [phone]  Occupational Therapy Progress Note  Dates of reporting period  05/03/2019  to   06/17/2019  Patient Details  Name: Curtis Powell MRN: [id number] Date of Birth: 04/20/1965 No data recorded  Encounter Date: 06/17/2019  OT End of Session - 06/17/19 1441    Visit Number  170    Number of Visits  209    Date for OT Re-Evaluation  07/28/19    Authorization Type  Progress reporting period starting 05/03/2019    OT Start Time  1440    OT Stop Time  1530    OT Time Calculation (min)  50 min    Activity Tolerance  Patient tolerated treatment well    Behavior During Therapy  [facility] for tasks assessed/performed       Past Medical History:  Diagnosis Date  . GERD (gastroesophageal reflux disease)   . Hyperlipidemia   . Hypertension   . Obesity   . Stroke [facility])     Past Surgical History:  Procedure Laterality Date  . BRAIN SURGERY      There were no vitals filed for this visit.  Subjective Assessment - 06/17/19 1440    Subjective   Pt. reports that he is feeling stronger    Patient is accompanied by:  Family member    Pertinent History  Pt. is a 54 y.o. male who suffered a CVA on 05/01/2016. Pt. was admitted to the hospital. Once discharged, he received Home Health PT and OT services for about a month. Pt. has had multiple CVAs over the past 8 years, and has had multiple falls in the past 6 months. Pt. resides in an apartment. Pt. has caregivers for 80 hours. Pt.'s mother stays with pt. at night, and assists with IADL tasks.      Patient Stated Goals  To be able to throw a ball and dribble a ball, do as much as I can for myself.     Currently in Pain?  No/denies         [facility] OT Assessment - 06/17/19 0001      Coordination   Right 9 Hole Peg Test  52 sec.  (Pended)       Hand Function   Right Hand Grip (lbs)  37  (Pended)     Right Hand Lateral Pinch  22 lbs  (Pended)     Right Hand 3 Point Pinch  15 lbs  (Pended)     Left Hand Grip (lbs)  41#  (Pended)     Left Hand Lateral Pinch  18 lbs  (Pended)     Left 3 point pinch  13 lbs  (Pended)       Measurements were obtained, and goals were reviewed with the pt.    OT TREATMENT    Selfcare:  Pt. worked on Estate agent tasks, and Media planner. Pt. worked on Editor, commissioning a sentence with increased time. Pt. was able to maintain a mature grasp on the pen, and no deviation from the line. Pt. printed with 50% legibility for the sentence. Pt. required increased time to work on formulating a sentence. Pt. Improved with letter, and word spacing with the sentence, no deviation below the line, and 50% legibility. Pt. Improved with writing when one sentence at a time is visible, and the other sentences have been blocked.  Pt. is making progress overall, and  is now able to independently use his razer after it has been plugged in. Pt. is improving with self dressing skills, and reaching into closets for clothing, and ironing at the tabletop. Pt. continues to work on refining these tasks, and increasing independence with them. Pt.'s right grip strength, and [facility] skills are limited. Pt. missed several appointments since previous progress report period due to illness. Pt. continues to work on improving RUE functioning, and functional reaching in order to be able to improve, ADL, and IADL tasks, as well as writing legibility.                   OT Education - 06/17/19 1441    Education provided  Yes    Education Details  [facility]    Person(s) Educated  Patient    Comprehension  Verbalized understanding;Returned demonstration;Verbal cues required       OT Short Term Goals - 03/03/17 1459      OT SHORT TERM GOAL #1   Title  `        OT Long Term Goals - 06/17/19 1442      OT LONG TERM GOAL #4   Title  Pt. will perform self dressing with minA and A/E as  needed.    Baseline  06/17/2019: Pt. continues to require assist with donning underwear over feet. Min-ModA donning his jacket. (Depending on the jacket)    Time  12    Period  Weeks    Status  Partially Met    Target Date  07/28/19      OT LONG TERM GOAL #6   Title  Pt. will write his name efficiently with 100% legibility    Baseline  06/17/2019: Pt continues to present with limited with 50% legibility and efficiency when writing name to sign in when riding the [place]    Time  12    Period  Weeks    Status  On-going    Target Date  07/28/19      OT LONG TERM GOAL #7   Title  Pt will independently and consistently follow HEP to increase UE strength to increase functional independence    Baseline  Pt. continues to work towards independence with exercises    Time  12    Period  Weeks    Status  On-going    Target Date  07/28/19      OT LONG TERM GOAL #8   Title  Pt. will require supervision ironing a shirt.    Baseline  Pt. has progressed CGA, and complete set-up seated using a tabletop iron    Time  12    Period  Weeks    Status  On-going    Target Date  07/28/19      OT LONG TERM GOAL  #9   Baseline  Pt. will independently be able to sew a button onto a shirt.    Time  12    Period  Weeks    Status  On-going    Target Date  07/28/19      OT LONG TERM GOAL  #10   TITLE  Pt. will independently be able to throw a ball    Baseline  Pt. continues to be limited with throwing overhand, however pt. is able to perform underhand throwing.    Time  12    Period  Weeks    Status  On-going    Target Date  07/28/19      OT LONG TERM  GOAL  #11   TITLE  Pt. will improve UE functional reaching to be able to independently use his RUE to hand his clothes in the closest.    Baseline  Pt. continues to be able to reach for clothing pulling them off of the hangers. Pt. is unable to reach up to place hangers onto the the rack.    Time  12    Period  Weeks    Status  On-going    Target Date   07/28/19      OT LONG TERM GOAL  #12   TITLE  Pt. Will be independent with shaving using an electric razer.    Baseline  Pt. is able to use the electric razer independently, however is unable to access the position of the plug and requires assist form caregivers secondary to the size of the bathroom, and location of the plug.    Time  12    Period  Weeks    Status  Achieved    Target Date  07/28/19            Plan - 06/17/19 1655    Clinical Impression Statement  Pt. is making progress overall, and is now able to independently use his razer after it has been plugged in. Pt. is improving with self dressing skills, and reaching into closets for clothing, and ironing at the tabletop. Pt. continues to work on refining these tasks, and increasing independence with them. Pt.'s right grip strength, and [facility] skills are limited. Pt. missed several appointments since previous progress report period due to illness. Pt. continues to work on improving RUE functioning, and functional reaching in order to be able to improve, ADL,a nd IADL tasks, as well as writing legibility.    OT Occupational Profile and History  Problem Focused Assessment - Including review of records relating to presenting problem    Occupational performance deficits (Please refer to evaluation for details):  ADL's;IADL's    Body Structure / Function / Physical Skills  ADL;FMC;ROM;IADL;Pain;Coordination    Rehab Potential  Fair    Clinical Decision Making  Several treatment options, min-mod task modification necessary    Comorbidities Affecting Occupational Performance:  May have comorbidities impacting occupational performance    Modification or Assistance to Complete Evaluation   Min-Moderate modification of tasks or assist with assess necessary to complete eval    OT Frequency  2x / week    OT Duration  12 weeks    OT Treatment/Interventions  Self-care/ADL training;Patient/family education;DME and/or AE instruction;Therapeutic  exercise;Moist Heat;Neuromuscular education    Consulted and Agree with Plan of Care  Patient       Patient will benefit from skilled therapeutic intervention in order to improve the following deficits and impairments:   Body Structure / Function / Physical Skills: ADL, FMC, ROM, IADL, Pain, Coordination       Visit Diagnosis: Muscle weakness (generalized)  Other lack of coordination    Problem List Patient Active Problem List   Diagnosis Date Noted  . Acute CVA (cerebrovascular accident) ([place]) 05/02/2016  . Left-sided weakness 05/01/2016    Harrel Carina, MS, OTR/L 06/17/2019, 5:08 PM  [place] MAIN [facility] SERVICES [address] [place], [place], [address] Phone: [phone]   Fax:  [phone]  Name: OTHER ATIENZA MRN: [id number] Date of Birth: 10/26/1965

## 2019-06-22 ENCOUNTER — Other Ambulatory Visit: Payer: Self-pay

## 2019-06-22 ENCOUNTER — Encounter: Payer: Self-pay | Admitting: Occupational Therapy

## 2019-06-22 ENCOUNTER — Ambulatory Visit: Payer: Medicare HMO | Admitting: Physical Therapy

## 2019-06-22 ENCOUNTER — Encounter: Payer: Self-pay | Admitting: Physical Therapy

## 2019-06-22 ENCOUNTER — Ambulatory Visit: Payer: Medicare HMO | Admitting: Occupational Therapy

## 2019-06-22 DIAGNOSIS — M6281 Muscle weakness (generalized): Secondary | ICD-10-CM | POA: Diagnosis not present

## 2019-06-22 DIAGNOSIS — R278 Other lack of coordination: Secondary | ICD-10-CM

## 2019-06-22 DIAGNOSIS — I69351 Hemiplegia and hemiparesis following cerebral infarction affecting right dominant side: Secondary | ICD-10-CM

## 2019-06-22 DIAGNOSIS — R2689 Other abnormalities of gait and mobility: Secondary | ICD-10-CM

## 2019-06-22 NOTE — Therapy (Signed)
[place] MAIN [facility] SERVICES [address] [place], [place], [address] Phone: [phone]   Fax:  [phone]  Occupational Therapy Treatment  Patient Details  Name: Curtis Powell MRN: [id number] Date of Birth: Aug 20, 1965 No data recorded  Encounter Date: 06/22/2019  OT End of Session - 06/22/19 1319    Visit Number  [id number]    Number of Visits  209    Date for OT Re-Evaluation  07/28/19    Authorization Type  Progress reporting period starting 05/03/2019    OT Start Time  1300    OT Stop Time  1345    OT Time Calculation (min)  45 min    Equipment Utilized During Treatment  built up pen and adaptive scissors    Activity Tolerance  Patient tolerated treatment well    Behavior During Therapy  WFL for tasks assessed/performed       Past Medical History:  Diagnosis Date  . GERD (gastroesophageal reflux disease)   . Hyperlipidemia   . Hypertension   . Obesity   . Stroke [facility])     Past Surgical History:  Procedure Laterality Date  . BRAIN SURGERY      There were no vitals filed for this visit.  Subjective Assessment - 06/22/19 1307    Subjective   Pt reports he is feeling good today but stressed that transportation was running behind to pick him up today.    Pertinent History  Pt. is a 54 y.o. male who suffered a CVA on 05/01/2016. Pt. was admitted to the hospital. Once discharged, he received Home Health PT and OT services for about a month. Pt. has had multiple CVAs over the past 8 years, and has had multiple falls in the past 6 months. Pt. resides in an apartment. Pt. has caregivers for 80 hours. Pt.'s mother stays with pt. at night, and assists with IADL tasks.      Patient Stated Goals  To be able to throw a ball and dribble a ball, do as much as I can for myself.     Currently in Pain?  No/denies       OT TREATMENT    Selfcare:  Pt. worked on Estate agent tasks, and Media planner using his R hand. Pt. worked on spelling and  set up of a check to write our various checks with different amounts.  Mod cues to help identify where the information went on the check and for accuracy to ensure the amount written and the number line matched.  Pt. printed with 75% legibility first 75% of the words, and 25% legibility for to last 25% of the words.  Pt. required increased time to work on formulating spelling and following set up of the check..Worked on cutting skills using the loop scissors with right hand with min cues for proper positioning and control.  Able to cut up small pieces with LUE supported to keep paper more secure which he problem solved on his own. Discussed using an extension cord with use of electric razor at home.  Pt. continues to work on Building surveyor, writing legibility, and letter formation. Pt. was able to maintain a mature grasp on the pen. Pt. requires increased time to complete the task with increased letter size. Pt. Presented with decreased writing legibility for the second half of the check writing exercise and mod cues for set up of information. Pt. continues to work on improving writing skills in order to be able to write in small  spaces filling out a calendar, and planners.                      OT Education - 06/22/19 1318    Education provided  Yes    Education Details  [facility]    Person(s) Educated  Patient    Methods  Explanation;Demonstration    Comprehension  Verbalized understanding;Returned demonstration;Verbal cues required       OT Short Term Goals - 03/03/17 1459      OT SHORT TERM GOAL #1   Title  `        OT Long Term Goals - 06/17/19 1442      OT LONG TERM GOAL #4   Title  Pt. will perform self dressing with minA and A/E as needed.    Baseline  06/17/2019: Pt. continues to require assist with donning underwear over feet. Min-ModA donning his jacket. (Depending on the jacket)    Time  12    Period  Weeks    Status  Partially Met    Target Date  07/28/19       OT LONG TERM GOAL #6   Title  Pt. will write his name efficiently with 100% legibility    Baseline  06/17/2019: Pt continues to present with limited with 50% legibility and efficiency when writing name to sign in when riding the [place]    Time  12    Period  Weeks    Status  On-going    Target Date  07/28/19      OT LONG TERM GOAL #7   Title  Pt will independently and consistently follow HEP to increase UE strength to increase functional independence    Baseline  Pt. continues to work towards independence with exercises    Time  12    Period  Weeks    Status  On-going    Target Date  07/28/19      OT LONG TERM GOAL #8   Title  Pt. will require supervision ironing a shirt.    Baseline  Pt. has progressed CGA, and complete set-up seated using a tabletop iron    Time  12    Period  Weeks    Status  On-going    Target Date  07/28/19      OT LONG TERM GOAL  #9   Baseline  Pt. will independently be able to sew a button onto a shirt.    Time  12    Period  Weeks    Status  On-going    Target Date  07/28/19      OT LONG TERM GOAL  #10   TITLE  Pt. will independently be able to throw a ball    Baseline  Pt. continues to be limited with throwing overhand, however pt. is able to perform underhand throwing.    Time  12    Period  Weeks    Status  On-going    Target Date  07/28/19      OT LONG TERM GOAL  #11   TITLE  Pt. will improve UE functional reaching to be able to independently use his RUE to hand his clothes in the closest.    Baseline  Pt. continues to be able to reach for clothing pulling them off of the hangers. Pt. is unable to reach up to place hangers onto the the rack.    Time  12    Period  Weeks    Status  On-going    Target Date  07/28/19      OT LONG TERM GOAL  #12   TITLE  Pt. will  be independent with shaving using an electric razer.    Baseline  Pt. is able to use the electric razer independently, however is unable to access the position of the plug and  requires assist form caregivers secondary to the size of the bathroom, and location of the plug.    Time  12    Period  Weeks    Status  Achieved    Target Date  07/28/19            Plan - 06/22/19 1321    Clinical Impression Statement  Pt cotninues to make progress with use of RUE and hand for ADLs and IADLS.  He is eager to keep wroking on writing skills, check writing and use of other devices like scossors as well as fine motor skills to improve independence with ADLs and IADLs.  His writing legibility continues to improve    OT Occupational Profile and History  Problem Focused Assessment - Including review of records relating to presenting problem    Occupational performance deficits (Please refer to evaluation for details):  ADL's;IADL's    Body Structure / Function / Physical Skills  ADL;FMC;ROM;IADL;Pain;Coordination    Rehab Potential  Fair    Clinical Decision Making  Several treatment options, min-mod task modification necessary    Comorbidities Affecting Occupational Performance:  May have comorbidities impacting occupational performance    Modification or Assistance to Complete Evaluation   Min-Moderate modification of tasks or assist with assess necessary to complete eval    OT Frequency  2x / week    OT Duration  12 weeks    OT Treatment/Interventions  Self-care/ADL training;Patient/family education;DME and/or AE instruction;Therapeutic exercise;Neuromuscular education    Consulted and Agree with Plan of Care  Patient       Patient will benefit from skilled therapeutic intervention in order to improve the following deficits and impairments:   Body Structure / Function / Physical Skills: ADL, FMC, ROM, IADL, Pain, Coordination       Visit Diagnosis: Muscle weakness (generalized)  Other lack of coordination  Hemiplegia and hemiparesis following cerebral infarction affecting right dominant side [facility])    Problem List Patient Active Problem List   Diagnosis Date  Noted  . Acute CVA (cerebrovascular accident) ([place]) 05/02/2016  . Left-sided weakness 05/01/2016    Chrys Racer, OTR/L, [facility] ascom [phone] 06/22/19, 1:42 PM  [place] MAIN [facility] SERVICES [address] [place], [place], [address] Phone: [phone]   Fax:  [phone]  Name: Curtis Powell MRN: [id number] Date of Birth: 1966-02-16

## 2019-06-22 NOTE — Therapy (Signed)
[place] MAIN [facility] SERVICES [address] [place], [place], [address] Phone: [phone]   Fax:  [phone]  Physical Therapy Treatment  Patient Details  Name: Curtis Powell MRN: [id number] Date of Birth: September 18, 1965 No data recorded  Encounter Date: 06/22/2019  PT End of Session - 06/22/19 1350    Visit Number  [id number]    Number of Visits  188    Date for PT Re-Evaluation  07/28/19    PT Start Time  3710    PT Stop Time  1430    PT Time Calculation (min)  42 min    Equipment Utilized During Treatment  Gait belt    Activity Tolerance  Patient tolerated treatment well;No increased pain    Behavior During Therapy  WFL for tasks assessed/performed       Past Medical History:  Diagnosis Date  . GERD (gastroesophageal reflux disease)   . Hyperlipidemia   . Hypertension   . Obesity   . Stroke [facility])     Past Surgical History:  Procedure Laterality Date  . BRAIN SURGERY      There were no vitals filed for this visit.  Subjective Assessment - 06/22/19 1408    Subjective  Patient reports doing well; Reports adherence with HEP; denies any pain; denies any new falls;    Pertinent History   Patient is a pleasant 54 year old male who presents to physical therapy for weakness and immobility secondary to CVA.  Had a stroke in Feb 2018. He was previously fully independent, but this stroke caused severe residual deficits, mainly on the right side as well as speech, and now he is unable to walk or perform most of his ADLs on his own. Entire right side is very weak. He still has a little difficulty with speech but his swallowing is improved to baseline. His mom had to move in with him and now is his main caregiver.     Limitations  Sitting;Lifting;Standing;Walking;House hold activities    How long can you sit comfortably?  5 minutes    How long can you stand comfortably?  5 minutes    How long can you walk comfortably?  10 ft    Patient Stated Goals   Walk without walker, walk further with walker, get some strength in back     Currently in Pain?  No/denies    Pain Onset  More than a month ago    Multiple Pain Sites  No         TREATMENT:    Patient ambulates with RW 150 feet with RW with slight flexed posture, decreased step length with increased lateral sway to facilitate increased weight shift for foot clearance.  Required CGA for safety and cues to improve erect posture, increase step length with increased hip/knee flexion during swing. He had occasional foot drag especially on LLE as a result of decreased weight shift to RLE during mid stance. Pt exhibits slow gait speed;  Therapeutic exercises Nu-step BUE/BLE level 3 x4 min  With cues to increase steps per minute >60 for cardiovascular challenge;  Standing in parallel bars: -hip flexion march x15 reps with cues to increase hip flexion AROM for better strengthening; -Hip extension SLR x15 reps bilaterally, required CGA and min Vcs to improve erect posture for better hip strengthening; -mini squat unsupported x5 reps with CGA for safety;  -heel raises x15 reps -side stepping x10 feet x1 lap each direction;   Pt educated throughout session about  proper posture and technique with exercises. Improved exercise technique, movement at target joints, use of target muscles after min to mod verbal, visual, tactile cues.  Vitals monitored throughout session, HR mid 90's to low 100 following exercise.                      PT Education - 06/22/19 1350    Education provided  Yes    Education Details  LEstrength, gait safety , HEP    Person(s) Educated  Patient    Methods  Explanation;Verbal cues    Comprehension  Verbalized understanding;Returned demonstration;Verbal cues required;Need further instruction       PT Short Term Goals - 04/07/19 1504      PT SHORT TERM GOAL #1   Title  Patient will be independent in home exercise program to improve strength/mobility  for better functional independence with ADLs.    Baseline  hep compliant    Time  2    Period  Weeks    Status  Achieved      PT SHORT TERM GOAL #2   Title  Patient will require min cueing for STS transfer with CGA for increased independence with mobility.     Baseline  CGA    Time  2    Period  Weeks    Status  Achieved      PT SHORT TERM GOAL #3   Title  Patient will maintain upright posture for > 15 seconds to demonstrate strengthened postural control muscualture     Baseline  18 seconds     Time  2    Period  Weeks    Status  Achieved        PT Long Term Goals - 06/02/19 0001      PT LONG TERM GOAL #2   Title  Patient (< 86 years old) will complete five times sit to stand test in < 10 seconds indicating an increased LE strength and improved balance.    Baseline  06/02/19: 19.93 seconds one hand on RW, one hand on w/c 12/30: 16 secpmds BUE support 02/11/19: 36 seconds 5/19: 20.6s from [facility], one hand on walker and one on WC, 80% upright posture 7/1: 22 seconds one hand on walker one hand on w/c  9/14: 53 seconds with full upright posture    Time  8    Period  Weeks    Status  On-going    Target Date  07/28/19      PT LONG TERM GOAL #3   Title  Patient will increase Berg Balance score by > 6 points (26/56) to demonstrate decreased fall risk during functional activities.    Baseline  06/02/19: 28/56 12/23: 25/56 9/12: 20/56 10/15: 21/56 11/7: 20/56 12/19: 22/56; 08/26/18: 11/56 7/1: 17/56  9/14: 23/56    Time  8    Period  Weeks    Status  Partially Met    Target Date  07/28/19      PT LONG TERM GOAL #4   Title  Patient will increase BLE gross strength to 4+/5 as to improve functional strength for independent gait, increased standing tolerance and increased ADL ability.    Baseline   06/02/19: hip flexion 4-/5, extension/abd 3/5 12/30: hip flexion 4/5 hip extension and abduction 3/5 02/11/19: hip flexion 4/5 hip extension and abduction 2+/54-/5 RLE, 10/15: 4/5 RLE 11/7: 4/5 RLE  3/3: 4/5 7/1:  hip flexion 4/5 hip extension 2/5 hip abduction/adduciton 2/5     Time  8    Period  Weeks    Status  On-going    Target Date  07/28/19      PT LONG TERM GOAL #5   Title  Patient will increase lower extremity functional scale to >40/80 to demonstrate improved functional mobility and increased tolerance with ADLs.     Baseline  06/02/19: 31/80 12/30: 35/80 02/10/19: 34/56 14/80; 12/26: 21/80; 2/18: 28/80  4/10; 25/80 5/29: 27/80; 7/16: 29/80 8/22: 30/80  9/12: 26/80 (feeling more tired due to moving) 10/15: 37/80 11/7: 23/80 3/3: 32/80; 08/25/18: 28/80 9/15: 32/56    Time  8    Period  Weeks    Status  On-going    Target Date  07/28/19      PT LONG TERM GOAL #6   Title  Patient will ambulate 150 ft with least assistive device and Supervision with no breaks to allow for increased mobility within home.    Baseline  2/24: ambulated 120 ft in previous session 12/30: unable to perform due to elevated HR 11/4: 105 ft with AFO after 10 MWT 11/7: 126f 12/19: 115 ft  3/3: will assess next session, has done 200 ft with one rest break; 08/26/18: Pt fatigued after 30' today 9/14: 100 ft with CGA    Time  8    Period  Weeks    Status  Partially Met    Target Date  07/28/19      PT LONG TERM GOAL #7   Title  Patient will perform 10 MWT in <30 seconds for improved gait speed, gait mechanics, and functional capacity for mobility.     Baseline  06/02/19: 62.5 seconds with RW 11/4: 56 seconds with AFO and RW 12/30: 53 seconds with AFO and RW     Time  8    Period  Weeks    Status  On-going    Target Date  07/28/19            Plan - 06/22/19 1423    Clinical Impression Statement  Patient motivated and participated well within session. He is progressing in gait and LE strengthening exercise. Progressed strengthening with instruction in standing exercise. Vitals monitored with good HR response. Patient does require min VCS for correct exercise technique and proper positioning/posture. He  does continues to have forward flexed posture with prolonged standing. He reports mild fatigue with advanced exercise. He would benefit from additional skilled PT intervention to improve strength, balance and gait safety;    Rehab Potential  Fair    Clinical Impairments Affecting Rehab Potential  hx of HTN, HLD, CVA, learning disability, Diabetes, brain tumor,     PT Frequency  2x / week    PT Duration  8 weeks    PT Treatment/Interventions  ADLs/Self Care Home Management;Aquatic Therapy;Ultrasound;Moist Heat;Traction;DME Instruction;Gait training;Stair training;Functional mobility training;Therapeutic activities;Therapeutic exercise;Orthotic Fit/Training;Neuromuscular re-education;Balance training;Patient/family education;Manual techniques;Wheelchair mobility training;Passive range of motion;Energy conservation;Taping;Visual/perceptual remediation/compensation    PT Next Visit Plan  progress stnading balance    PT Home Exercise Plan  TrA contraction, glute sets: needs updates. Potentially some time in supine flat in hospital bed, to improve hip extension deficits.     Consulted and Agree with Plan of Care  Patient       Patient will benefit from skilled therapeutic intervention in order to improve the following deficits and impairments:  Abnormal gait, Decreased activity tolerance, Decreased balance, Decreased knowledge of precautions, Decreased endurance, Decreased coordination, Decreased knowledge of use of DME, Decreased mobility, Decreased range of motion,  Difficulty walking, Decreased safety awareness, Decreased strength, Impaired flexibility, Impaired perceived functional ability, Impaired tone, Postural dysfunction, Improper body mechanics, Pain  Visit Diagnosis: Muscle weakness (generalized)  Other lack of coordination  Other abnormalities of gait and mobility     Problem List Patient Active Problem List   Diagnosis Date Noted  . Acute CVA (cerebrovascular accident) ([place])  05/02/2016  . Left-sided weakness 05/01/2016    Savon Cobbs PT, DPT 06/22/2019, 2:30 PM  [place] MAIN [facility] SERVICES [address] [place], [place], [address] Phone: [phone]   Fax:  [phone]  Name: Curtis Powell MRN: [id number] Date of Birth: 14-Oct-1965

## 2019-06-24 ENCOUNTER — Ambulatory Visit: Payer: Medicare HMO | Admitting: Occupational Therapy

## 2019-06-24 ENCOUNTER — Ambulatory Visit: Payer: Medicare HMO | Admitting: Physical Therapy

## 2019-06-29 ENCOUNTER — Ambulatory Visit: Payer: Medicare HMO | Admitting: Occupational Therapy

## 2019-06-29 ENCOUNTER — Other Ambulatory Visit: Payer: Self-pay

## 2019-06-29 ENCOUNTER — Ambulatory Visit: Payer: Medicare HMO | Admitting: Physical Therapy

## 2019-06-29 ENCOUNTER — Encounter: Payer: Self-pay | Admitting: Physical Therapy

## 2019-06-29 ENCOUNTER — Encounter: Payer: Self-pay | Admitting: Occupational Therapy

## 2019-06-29 DIAGNOSIS — R531 Weakness: Secondary | ICD-10-CM

## 2019-06-29 DIAGNOSIS — M6281 Muscle weakness (generalized): Secondary | ICD-10-CM

## 2019-06-29 DIAGNOSIS — R278 Other lack of coordination: Secondary | ICD-10-CM

## 2019-06-29 DIAGNOSIS — I639 Cerebral infarction, unspecified: Secondary | ICD-10-CM

## 2019-06-29 DIAGNOSIS — R2689 Other abnormalities of gait and mobility: Secondary | ICD-10-CM

## 2019-06-29 DIAGNOSIS — I69351 Hemiplegia and hemiparesis following cerebral infarction affecting right dominant side: Secondary | ICD-10-CM

## 2019-06-29 NOTE — Therapy (Addendum)
[place] MAIN [facility] SERVICES [address] [place], [place], [address] Phone: [phone]   Fax:  [phone]  Occupational Therapy Treatment  Patient Details  Name: Curtis Powell MRN: [id number] Date of Birth: March 16, 1966 No data recorded  Encounter Date: 06/29/2019  OT End of Session - 06/29/19 1306    Visit Number  [id number]    Number of Visits  209    Date for OT Re-Evaluation  07/28/19    Authorization Type  Progress reporting period starting 05/03/2019    OT Start Time  1300    OT Stop Time  1345    OT Time Calculation (min)  45 min    Equipment Utilized During Treatment  built up pen and adaptive scissors    Activity Tolerance  Patient tolerated treatment well    Behavior During Therapy  WFL for tasks assessed/performed       Past Medical History:  Diagnosis Date  . GERD (gastroesophageal reflux disease)   . Hyperlipidemia   . Hypertension   . Obesity   . Stroke [facility])     Past Surgical History:  Procedure Laterality Date  . BRAIN SURGERY      There were no vitals filed for this visit.  Subjective Assessment - 06/29/19 1305    Subjective   Pt. reports that he is doing well.    Patient is accompanied by:  Family member    Pertinent History  Pt. is a 54 y.o. male who suffered a CVA on 05/01/2016. Pt. was admitted to the hospital. Once discharged, he received Home Health PT and OT services for about a month. Pt. has had multiple CVAs over the past 8 years, and has had multiple falls in the past 6 months. Pt. resides in an apartment. Pt. has caregivers for 80 hours. Pt.'s mother stays with pt. at night, and assists with IADL tasks.      Patient Stated Goals  To be able to throw a ball and dribble a ball, do as much as I can for myself.     Currently in Pain?  No/denies      OT TREATMENT    Selfcare:  Pt. worked on Estate agent tasks, and Media planner. Pt. worked on Ecologist of words in printed form,  followed by cursive form. Pt. Also worked on legibility with efficiency with writing sentences. Pt. requires increased time for words, lists, and sentences. Pt. was able to maintain a mature grasp on the pen, and no deviation from the line. Pt. printed the words in all capital letters with 75% legibility, and 25% legibility for cursive form. Pt. printed a sentence with 50%  legibility. Pt. required increased time to work on formulating a sentence. Pt. Improved with letter, and word spacing with the sentence, no deviation below the line, and 50% legibility. Pt. required one sentence to be visible at a time when copying the sentences.  Pt. continues to make progress overall, and is improving with writing legibility when printing lists of words at 75% legibility. Pt. requires increased time to complete, and has difficulty formulating letters in cursive with 25% legibility. Pt. requires increased time to copy a sentence with 50% legibility.  No deviation from the line. Pt. was able to maintain a mature grasp on the pen during the entire task. Pt. continues to work on improving RUE strength, coordination, and functional reaching in order to improve overall ADL, and IADL functioning.  OT Education - 06/29/19 1306    Education provided  Yes    Education Details  [facility]    Person(s) Educated  Patient    Methods  Explanation;Demonstration    Comprehension  Verbalized understanding;Returned demonstration;Verbal cues required       OT Short Term Goals - 03/03/17 1459      OT SHORT TERM GOAL #1   Title  `        OT Long Term Goals - 06/17/19 1442      OT LONG TERM GOAL #4   Title  Pt. will perform self dressing with minA and A/E as needed.    Baseline  06/17/2019: Pt. continues to require assist with donning underwear over feet. Min-ModA donning his jacket. (Depending on the jacket)    Time  12    Period  Weeks    Status  Partially Met    Target Date   07/28/19      OT LONG TERM GOAL #6   Title  Pt. will write his name efficiently with 100% legibility    Baseline  06/17/2019: Pt continues to present with limited with 50% legibility and efficiency when writing name to sign in when riding the [place]    Time  12    Period  Weeks    Status  On-going    Target Date  07/28/19      OT LONG TERM GOAL #7   Title  Pt will independently and consistently follow HEP to increase UE strength to increase functional independence    Baseline  Pt. continues to work towards independence with exercises    Time  12    Period  Weeks    Status  On-going    Target Date  07/28/19      OT LONG TERM GOAL #8   Title  Pt. will require supervision ironing a shirt.    Baseline  Pt. has progressed CGA, and complete set-up seated using a tabletop iron    Time  12    Period  Weeks    Status  On-going    Target Date  07/28/19      OT LONG TERM GOAL  #9   Baseline  Pt. will independently be able to sew a button onto a shirt.    Time  12    Period  Weeks    Status  On-going    Target Date  07/28/19      OT LONG TERM GOAL  #10   TITLE  Pt. will independently be able to throw a ball    Baseline  Pt. continues to be limited with throwing overhand, however pt. is able to perform underhand throwing.    Time  12    Period  Weeks    Status  On-going    Target Date  07/28/19      OT LONG TERM GOAL  #11   TITLE  Pt. will improve UE functional reaching to be able to independently use his RUE to hand his clothes in the closest.    Baseline  Pt. continues to be able to reach for clothing pulling them off of the hangers. Pt. is unable to reach up to place hangers onto the the rack.    Time  12    Period  Weeks    Status  On-going    Target Date  07/28/19      OT LONG TERM GOAL  #12   TITLE  Pt. will  be  independent with shaving using an Therapist, sports.    Baseline  Pt. is able to use the electric razer independently, however is unable to access the position of the  plug and requires assist form caregivers secondary to the size of the bathroom, and location of the plug.    Time  12    Period  Weeks    Status  Achieved    Target Date  07/28/19            Plan - 06/29/19 1306    Clinical Impression Statement Pt. continues to make progress overall, and is improving with writing legibility when printing lists of words at 75% legibility. Pt. requires increased time to complete, and has difficulty formulating letters in cursive with 25% legibility. Pt. requires increased time to copy a sentence with 50% legibility.  No deviation from the line. Pt. was able to maintain a mature grasp on the pen during the entire task. Pt. continues to work on improving RUE strength, coordination, and functional reaching in order to improve overall ADL, and IADL functioning.   OT Occupational Profile and History  Problem Focused Assessment - Including review of records relating to presenting problem    Occupational performance deficits (Please refer to evaluation for details):  ADL's;IADL's    Body Structure / Function / Physical Skills  ADL;FMC;ROM;IADL;Pain;Coordination    Rehab Potential  Fair    Clinical Decision Making  Several treatment options, min-mod task modification necessary    Comorbidities Affecting Occupational Performance:  May have comorbidities impacting occupational performance    Modification or Assistance to Complete Evaluation   Min-Moderate modification of tasks or assist with assess necessary to complete eval    OT Frequency  2x / week    OT Duration  12 weeks    OT Treatment/Interventions  Self-care/ADL training;Patient/family education;DME and/or AE instruction;Therapeutic exercise;Neuromuscular education    Consulted and Agree with Plan of Care  Patient       Patient will benefit from skilled therapeutic intervention in order to improve the following deficits and impairments:   Body Structure / Function / Physical Skills: ADL, FMC, ROM, IADL,  Pain, Coordination       Visit Diagnosis: Muscle weakness (generalized)  Other lack of coordination    Problem List Patient Active Problem List   Diagnosis Date Noted  . Acute CVA (cerebrovascular accident) ([place]) 05/02/2016  . Left-sided weakness 05/01/2016    Harrel Carina, MS, OTR/L 06/29/2019, 1:09 PM  [place] MAIN [facility] SERVICES [address] [place], [place], [address] Phone: [phone]   Fax:  [phone]  Name: Curtis Powell MRN: [id number] Date of Birth: 26-Jan-1966

## 2019-06-29 NOTE — Therapy (Signed)
[place] MAIN [facility] SERVICES [address] [place], [place], [address] Phone: [phone]   Fax:  [phone]  Physical Therapy Treatment  Patient Details  Name: Curtis Powell MRN: [id number] Date of Birth: 03/12/1966 No data recorded  Encounter Date: 06/29/2019  PT End of Session - 06/29/19 1405    Visit Number  [id number]    Number of Visits  188    Date for PT Re-Evaluation  07/28/19    PT Start Time  0146    PT Stop Time  0225    PT Time Calculation (min)  39 min    Equipment Utilized During Treatment  Gait belt    Activity Tolerance  Patient tolerated treatment well;No increased pain    Behavior During Therapy  WFL for tasks assessed/performed       Past Medical History:  Diagnosis Date  . GERD (gastroesophageal reflux disease)   . Hyperlipidemia   . Hypertension   . Obesity   . Stroke [facility])     Past Surgical History:  Procedure Laterality Date  . BRAIN SURGERY      There were no vitals filed for this visit.  Subjective Assessment - 06/29/19 1405    Subjective  Patient reports doing well; Reports adherence with HEP; denies any pain; denies any new falls;    Pertinent History   Patient is a pleasant 54 year old male who presents to physical therapy for weakness and immobility secondary to CVA.  Had a stroke in Feb 2018. He was previously fully independent, but this stroke caused severe residual deficits, mainly on the right side as well as speech, and now he is unable to walk or perform most of his ADLs on his own. Entire right side is very weak. He still has a little difficulty with speech but his swallowing is improved to baseline. His mom had to move in with him and now is his main caregiver.     Limitations  Sitting;Lifting;Standing;Walking;House hold activities    How long can you sit comfortably?  5 minutes    How long can you stand comfortably?  5 minutes    How long can you walk comfortably?  10 ft    Patient Stated Goals   Walk without walker, walk further with walker, get some strength in back     Currently in Pain?  No/denies    Pain Score  0-No pain    Pain Onset  More than a month ago        Treatment: Gait training: Patient ambulates with RW 75 feet x 2 with 1 seated rest period and RW and lateral sway with decreased knee flex and hip flex.  Therapeutic exercises Nu-step x 5 mins , L 3 Seated LAQ with 5 lbs x 20x 3 sets BLE Seated marching with 5 lbs x 20 x 2 BLE  Pt educated throughout session about proper posture and technique with exercises. Improved exercise technique, movement at target joints, use of target muscles after min to mod verbal, visual, tactile cues. Patient performed with instruction, verbal cues, tactile cues of therapist: goal:increase tissue extensibility, promote proper posture, improve mobility                         PT Education - 06/29/19 1405    Education provided  Yes    Education Details  HEP    Person(s) Educated  Patient    Methods  Explanation  Comprehension  Verbalized understanding;Need further instruction       PT Short Term Goals - 04/07/19 1504      PT SHORT TERM GOAL #1   Title  Patient will be independent in home exercise program to improve strength/mobility for better functional independence with ADLs.    Baseline  hep compliant    Time  2    Period  Weeks    Status  Achieved      PT SHORT TERM GOAL #2   Title  Patient will require min cueing for STS transfer with CGA for increased independence with mobility.     Baseline  CGA    Time  2    Period  Weeks    Status  Achieved      PT SHORT TERM GOAL #3   Title  Patient will maintain upright posture for > 15 seconds to demonstrate strengthened postural control muscualture     Baseline  18 seconds     Time  2    Period  Weeks    Status  Achieved        PT Long Term Goals - 06/02/19 0001      PT LONG TERM GOAL #2   Title  Patient (< 54 years old) will complete  five times sit to stand test in < 10 seconds indicating an increased LE strength and improved balance.    Baseline  06/02/19: 19.93 seconds one hand on RW, one hand on w/c 12/30: 16 secpmds BUE support 02/11/19: 36 seconds 5/19: 20.6s from [facility], one hand on walker and one on WC, 80% upright posture 7/1: 22 seconds one hand on walker one hand on w/c  9/14: 53 seconds with full upright posture    Time  8    Period  Weeks    Status  On-going    Target Date  07/28/19      PT LONG TERM GOAL #3   Title  Patient will increase Berg Balance score by > 6 points (26/56) to demonstrate decreased fall risk during functional activities.    Baseline  06/02/19: 28/56 12/23: 25/56 9/12: 20/56 10/15: 21/56 11/7: 20/56 12/19: 22/56; 08/26/18: 11/56 7/1: 17/56  9/14: 23/56    Time  8    Period  Weeks    Status  Partially Met    Target Date  07/28/19      PT LONG TERM GOAL #4   Title  Patient will increase BLE gross strength to 4+/5 as to improve functional strength for independent gait, increased standing tolerance and increased ADL ability.    Baseline   06/02/19: hip flexion 4-/5, extension/abd 3/5 12/30: hip flexion 4/5 hip extension and abduction 3/5 02/11/19: hip flexion 4/5 hip extension and abduction 2+/54-/5 RLE, 10/15: 4/5 RLE 11/7: 4/5 RLE 3/3: 4/5 7/1:  hip flexion 4/5 hip extension 2/5 hip abduction/adduciton 2/5     Time  8    Period  Weeks    Status  On-going    Target Date  07/28/19      PT LONG TERM GOAL #5   Title  Patient will increase lower extremity functional scale to >40/80 to demonstrate improved functional mobility and increased tolerance with ADLs.     Baseline  06/02/19: 31/80 12/30: 35/80 02/10/19: 34/56 14/80; 12/26: 21/80; 2/18: 28/80  4/10; 25/80 5/29: 27/80; 7/16: 29/80 8/22: 30/80  9/12: 26/80 (feeling more tired due to moving) 10/15: 37/80 11/7: 23/80 3/3: 32/80; 08/25/18: 28/80 9/15: 32/56    Time  8    Period  Weeks    Status  On-going    Target Date  07/28/19      PT LONG TERM  GOAL #6   Title  Patient will ambulate 150 ft with least assistive device and Supervision with no breaks to allow for increased mobility within home.    Baseline  2/24: ambulated 120 ft in previous session 12/30: unable to perform due to elevated HR 11/4: 105 ft with AFO after 10 MWT 11/7: 173f 12/19: 115 ft  3/3: will assess next session, has done 200 ft with one rest break; 08/26/18: Pt fatigued after 30' today 9/14: 100 ft with CGA    Time  8    Period  Weeks    Status  Partially Met    Target Date  07/28/19      PT LONG TERM GOAL #7   Title  Patient will perform 10 MWT in <30 seconds for improved gait speed, gait mechanics, and functional capacity for mobility.     Baseline  06/02/19: 62.5 seconds with RW 11/4: 56 seconds with AFO and RW 12/30: 53 seconds with AFO and RW     Time  8    Period  Weeks    Status  On-going    Target Date  07/28/19            Plan - 06/29/19 1406    Clinical Impression Statement   Pt was able to perform all exercises today with CGA..Marland KitchenPt was able to perform strength exercises,Patient is demonstrating improvements in LE strength and stability.    Pt requires verbal, visual and tactile cues during exercise in order to complete tasks with proper form and technique, as well as to stay on task.  Pt would continue to benefit from skilled PT services in order to further strengthen LE's, improve static and dynamic balance, in order to increase functional mobility and decrease risk of falls    Rehab Potential  Fair    Clinical Impairments Affecting Rehab Potential  hx of HTN, HLD, CVA, learning disability, Diabetes, brain tumor,     PT Frequency  2x / week    PT Duration  8 weeks    PT Treatment/Interventions  ADLs/Self Care Home Management;Aquatic Therapy;Ultrasound;Moist Heat;Traction;DME Instruction;Gait training;Stair training;Functional mobility training;Therapeutic activities;Therapeutic exercise;Orthotic Fit/Training;Neuromuscular re-education;Balance  training;Patient/family education;Manual techniques;Wheelchair mobility training;Passive range of motion;Energy conservation;Taping;Visual/perceptual remediation/compensation    PT Next Visit Plan  progress stnading balance    PT Home Exercise Plan  TrA contraction, glute sets: needs updates. Potentially some time in supine flat in hospital bed, to improve hip extension deficits.     Consulted and Agree with Plan of Care  Patient       Patient will benefit from skilled therapeutic intervention in order to improve the following deficits and impairments:  Abnormal gait, Decreased activity tolerance, Decreased balance, Decreased knowledge of precautions, Decreased endurance, Decreased coordination, Decreased knowledge of use of DME, Decreased mobility, Decreased range of motion, Difficulty walking, Decreased safety awareness, Decreased strength, Impaired flexibility, Impaired perceived functional ability, Impaired tone, Postural dysfunction, Improper body mechanics, Pain  Visit Diagnosis: Muscle weakness (generalized)  Other lack of coordination  Other abnormalities of gait and mobility  Hemiplegia and hemiparesis following cerebral infarction affecting right dominant side (HCC)  Left-sided weakness  Acute CVA (cerebrovascular accident) ([facility]     Problem List Patient Active Problem List   Diagnosis Date Noted  . Acute CV[place] (cerebro[place]) [place] 05/02/2016  . Left-sided weakness 05/01/2016  [address] [place], [place] DPT 06/29/2019, 2:08 PM  [place] MAIN [facility] SERVICES [address] [place], [place], [address] Phone: [phone]   Fax:  [phone]  Name: Curtis Powell MRN: [id number] Date of Birth: 08-14-1965

## 2019-07-01 ENCOUNTER — Other Ambulatory Visit: Payer: Self-pay

## 2019-07-01 ENCOUNTER — Ambulatory Visit: Payer: Medicare HMO | Admitting: Occupational Therapy

## 2019-07-01 ENCOUNTER — Encounter: Payer: Self-pay | Admitting: Physical Therapy

## 2019-07-01 ENCOUNTER — Encounter: Payer: Self-pay | Admitting: Occupational Therapy

## 2019-07-01 ENCOUNTER — Ambulatory Visit: Payer: Medicare HMO | Admitting: Physical Therapy

## 2019-07-01 DIAGNOSIS — R278 Other lack of coordination: Secondary | ICD-10-CM

## 2019-07-01 DIAGNOSIS — R531 Weakness: Secondary | ICD-10-CM

## 2019-07-01 DIAGNOSIS — I639 Cerebral infarction, unspecified: Secondary | ICD-10-CM

## 2019-07-01 DIAGNOSIS — M6281 Muscle weakness (generalized): Secondary | ICD-10-CM

## 2019-07-01 DIAGNOSIS — R2689 Other abnormalities of gait and mobility: Secondary | ICD-10-CM

## 2019-07-01 DIAGNOSIS — I69351 Hemiplegia and hemiparesis following cerebral infarction affecting right dominant side: Secondary | ICD-10-CM

## 2019-07-01 NOTE — Therapy (Signed)
[place] MAIN [facility] SERVICES [address] [place], [place], [address] Phone: [phone]   Fax:  [phone]  Physical Therapy Treatment  Patient Details  Name: Curtis Powell MRN: [id number] Date of Birth: [birth year] No data recorded  Encounter Date: 07/01/2019  PT End of Session - 07/01/19 1354    Visit Number  [id number]    Number of Visits  188    Date for PT Re-Evaluation  07/28/19    PT Start Time  0145    PT Stop Time  0225    PT Time Calculation (min)  40 min    Equipment Utilized During Treatment  Gait belt    Activity Tolerance  Patient tolerated treatment well;No increased pain    Behavior During Therapy  WFL for tasks assessed/performed       Past Medical History:  Diagnosis Date  . GERD (gastroesophageal reflux disease)   . Hyperlipidemia   . Hypertension   . Obesity   . Stroke [facility])     Past Surgical History:  Procedure Laterality Date  . BRAIN SURGERY      There were no vitals filed for this visit.  Subjective Assessment - 07/01/19 1353    Subjective  Patient reports doing well; Reports adherence with HEP; denies any pain; denies any new falls;    Pertinent History   Patient is a pleasant 54 year old male who presents to physical therapy for weakness and immobility secondary to CVA.  Had a stroke in Feb 2018. He was previously fully independent, but this stroke caused severe residual deficits, mainly on the right side as well as speech, and now he is unable to walk or perform most of his ADLs on his own. Entire right side is very weak. He still has a little difficulty with speech but his swallowing is improved to baseline. His mom had to move in with him and now is his main caregiver.     Limitations  Sitting;Lifting;Standing;Walking;House hold activities    How long can you sit comfortably?  5 minutes    How long can you stand comfortably?  5 minutes    How long can you walk comfortably?  10 ft    Patient Stated Goals   Walk without walker, walk further with walker, get some strength in back     Currently in Pain?  No/denies    Pain Score  0-No pain    Pain Onset  More than a month ago       Patient ambulates with RW 95 feet x 2 with 1 seated rest period and RW and lateral sway with decreased knee flex and hip flex. Therapeutic exercises Nu-step x 5 mins , L 3 Seated LAQ with 4 lbs x 20x 3 sets BLE Seated marching with 4 lbs x 20 x 2 BLE Pt educated throughout session about proper posture and technique with exercises. Improved exercise technique, movement at target joints, use of target muscles after min to mod verbal, visual, tactile cues. Patient performed with instruction, verbal cues, tactile cues of therapist: goal:increase tissue extensibility, promote proper posture, improve mobility  .                       PT Education - 07/01/19 1354    Education provided  Yes    Education Details  HEP    Person(s) Educated  Patient    Methods  Explanation;Demonstration    Comprehension  Verbalized understanding;Need further instruction  PT Short Term Goals - 04/07/19 1504      PT SHORT TERM GOAL #1   Title  Patient will be independent in home exercise program to improve strength/mobility for better functional independence with ADLs.    Baseline  hep compliant    Time  2    Period  Weeks    Status  Achieved      PT SHORT TERM GOAL #2   Title  Patient will require min cueing for STS transfer with CGA for increased independence with mobility.     Baseline  CGA    Time  2    Period  Weeks    Status  Achieved      PT SHORT TERM GOAL #3   Title  Patient will maintain upright posture for > 15 seconds to demonstrate strengthened postural control muscualture     Baseline  18 seconds     Time  2    Period  Weeks    Status  Achieved        PT Long Term Goals - 06/02/19 0001      PT LONG TERM GOAL #2   Title  Patient (< 17 years old) will complete five times sit to  stand test in < 10 seconds indicating an increased LE strength and improved balance.    Baseline  06/02/19: 19.93 seconds one hand on RW, one hand on w/c 12/30: 16 secpmds BUE support 02/11/19: 36 seconds 5/19: 20.6s from [facility], one hand on walker and one on WC, 80% upright posture 7/1: 22 seconds one hand on walker one hand on w/c  9/14: 53 seconds with full upright posture    Time  8    Period  Weeks    Status  On-going    Target Date  07/28/19      PT LONG TERM GOAL #3   Title  Patient will increase Berg Balance score by > 6 points (26/56) to demonstrate decreased fall risk during functional activities.    Baseline  06/02/19: 28/56 12/23: 25/56 9/12: 20/56 10/15: 21/56 11/7: 20/56 12/19: 22/56; 08/26/18: 11/56 7/1: 17/56  9/14: 23/56    Time  8    Period  Weeks    Status  Partially Met    Target Date  07/28/19      PT LONG TERM GOAL #4   Title  Patient will increase BLE gross strength to 4+/5 as to improve functional strength for independent gait, increased standing tolerance and increased ADL ability.    Baseline   06/02/19: hip flexion 4-/5, extension/abd 3/5 12/30: hip flexion 4/5 hip extension and abduction 3/5 02/11/19: hip flexion 4/5 hip extension and abduction 2+/54-/5 RLE, 10/15: 4/5 RLE 11/7: 4/5 RLE 3/3: 4/5 7/1:  hip flexion 4/5 hip extension 2/5 hip abduction/adduciton 2/5     Time  8    Period  Weeks    Status  On-going    Target Date  07/28/19      PT LONG TERM GOAL #5   Title  Patient will increase lower extremity functional scale to >40/80 to demonstrate improved functional mobility and increased tolerance with ADLs.     Baseline  06/02/19: 31/80 12/30: 35/80 02/10/19: 34/56 14/80; 12/26: 21/80; 2/18: 28/80  4/10; 25/80 5/29: 27/80; 7/16: 29/80 8/22: 30/80  9/12: 26/80 (feeling more tired due to moving) 10/15: 37/80 11/7: 23/80 3/3: 32/80; 08/25/18: 28/80 9/15: 32/56    Time  8    Period  Weeks    Status  On-going    Target Date  07/28/19      PT LONG TERM GOAL #6   Title   Patient will ambulate 150 ft with least assistive device and Supervision with no breaks to allow for increased mobility within home.    Baseline  2/24: ambulated 120 ft in previous session 12/30: unable to perform due to elevated HR 11/4: 105 ft with AFO after 10 MWT 11/7: 146f 12/19: 115 ft  3/3: will assess next session, has done 200 ft with one rest break; 08/26/18: Pt fatigued after 30' today 9/14: 100 ft with CGA    Time  8    Period  Weeks    Status  Partially Met    Target Date  07/28/19      PT LONG TERM GOAL #7   Title  Patient will perform 10 MWT in <30 seconds for improved gait speed, gait mechanics, and functional capacity for mobility.     Baseline  06/02/19: 62.5 seconds with RW 11/4: 56 seconds with AFO and RW 12/30: 53 seconds with AFO and RW     Time  8    Period  Weeks    Status  On-going    Target Date  07/28/19            Plan - 07/01/19 1355    Clinical Impression Statement  Patient instructed in intermediate mobility challenges,gait and transfer training and safety training. Patient required min-mod VCs for correct positioning; Patient had increased difficulty with dynamic balance challenges and dule task movements especially in standing.  Patient would benefit from additional skilled PT intervention to improve strength, balance.    Rehab Potential  Fair    Clinical Impairments Affecting Rehab Potential  hx of HTN, HLD, CVA, learning disability, Diabetes, brain tumor,     PT Frequency  2x / week    PT Duration  8 weeks    PT Treatment/Interventions  ADLs/Self Care Home Management;Aquatic Therapy;Ultrasound;Moist Heat;Traction;DME Instruction;Gait training;Stair training;Functional mobility training;Therapeutic activities;Therapeutic exercise;Orthotic Fit/Training;Neuromuscular re-education;Balance training;Patient/family education;Manual techniques;Wheelchair mobility training;Passive range of motion;Energy conservation;Taping;Visual/perceptual  remediation/compensation    PT Next Visit Plan  progress stnading balance    PT Home Exercise Plan  TrA contraction, glute sets: needs updates. Potentially some time in supine flat in hospital bed, to improve hip extension deficits.     Consulted and Agree with Plan of Care  Patient       Patient will benefit from skilled therapeutic intervention in order to improve the following deficits and impairments:  Abnormal gait, Decreased activity tolerance, Decreased balance, Decreased knowledge of precautions, Decreased endurance, Decreased coordination, Decreased knowledge of use of DME, Decreased mobility, Decreased range of motion, Difficulty walking, Decreased safety awareness, Decreased strength, Impaired flexibility, Impaired perceived functional ability, Impaired tone, Postural dysfunction, Improper body mechanics, Pain  Visit Diagnosis: Muscle weakness (generalized)  Other lack of coordination  Other abnormalities of gait and mobility  Hemiplegia and hemiparesis following cerebral infarction affecting right dominant side (HCC)  Left-sided weakness  Acute CVA (cerebrovascular accident) ([facility]     Problem List Patient Active Problem List   Diagnosis Date Noted  . Acute CVA (cerebrovascular accident) ([facility] 05/02/2016  . Left-sided weakness 05/01/2016    MAlanson Puls PVirginiaDPT 07/01/2019, 1:56 PM  [place] [facility] [address] [address]Phone: [phone]  Fax:  [phone] Name: Curtis BRUEMRN: [id number] of Birth: 9Apr 12, 1967

## 2019-07-01 NOTE — Therapy (Addendum)
[place] MAIN [facility] SERVICES [address] [place], [place], [address] Phone: [phone]   Fax:  [phone]  Occupational Therapy Treatment  Patient Details  Name: Curtis Powell MRN: [id number] Date of Birth: June 17, 1965 No data recorded  Encounter Date: 07/01/2019  OT End of Session - 07/01/19 1441    Visit Number  [id number]    Number of Visits  209    Date for OT Re-Evaluation  07/28/19    OT Start Time  1435    OT Stop Time  1515    OT Time Calculation (min)  40 min    Equipment Utilized During Treatment  built up pen and adaptive scissors    Activity Tolerance  Patient tolerated treatment well    Behavior During Therapy  WFL for tasks assessed/performed       Past Medical History:  Diagnosis Date  . GERD (gastroesophageal reflux disease)   . Hyperlipidemia   . Hypertension   . Obesity   . Stroke [facility])     Past Surgical History:  Procedure Laterality Date  . BRAIN SURGERY      There were no vitals filed for this visit.  Subjective Assessment - 07/01/19 1440    Subjective   Pt. reports that he is doing well.    Patient is accompanied by:  Family member    Pertinent History  Pt. is a 54 y.o. male who suffered a CVA on 05/01/2016. Pt. was admitted to the hospital. Once discharged, he received Home Health PT and OT services for about a month. Pt. has had multiple CVAs over the past 8 years, and has had multiple falls in the past 6 months. Pt. resides in an apartment. Pt. has caregivers for 80 hours. Pt.'s mother stays with pt. at night, and assists with IADL tasks.      Currently in Pain?  No/denies      OT TREATMENT    Neuro muscular re-education:  Pt. performed right hand [place] tasks using the Grooved pegboard. Pt. worked on grasping the grooved pegs from a horizontal position, and moving the pegs to a vertical position in the hand to prepare for placing them in the grooved slot. Pt. Worked on removing the pegs one at a time  alternating thumb opposition to the tip of his 2nd through 5th digits.  Therapeutic Exercise:  Pt. performed gross gripping with a gross grip strengthener. Pt. worked on sustaining grip while grasping pegs and reaching at various heights. 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 RUE: 42.2#, 46.8#, 41.2#, 42.6#, 33.8#  Self-care:   Pt. Performed writing tasks with increased time required to formulate words in printed form. Pt. wrote with 50% legibility formulating words for a sentence. Pt. Was able to maintain a mature grasp on the pen with no deviation below the line.  Pt. Is improving, and making steady progress with RUE functioning. Pt. Continues to present with limited right hand motor control, and [facility] skills. Pt. had difficulty coordinating grasping pegs from a horizontal position to a vertical position, and turning them to the correct position with the tips of his digits. Pt. Continues to work on improving RUE motor control, and [facility] skills, during ADLs, and IADL tasks, as well as improving writing legibility, letter, and sentence formation.                        OT Education -  07/01/19 1440    Education provided  Yes    Education Details  [facility]    Person(s) Educated  Patient    Methods  Explanation;Demonstration    Comprehension  Verbalized understanding;Returned demonstration;Verbal cues required       OT Short Term Goals - 03/03/17 1459      OT SHORT TERM GOAL #1   Title  `        OT Long Term Goals - 06/17/19 1442      OT LONG TERM GOAL #4   Title  Pt. will perform self dressing with minA and A/E as needed.    Baseline  06/17/2019: Pt. continues to require assist with donning underwear over feet. Min-ModA donning his jacket. (Depending on the jacket)    Time  12    Period  Weeks    Status  Partially Met    Target Date  07/28/19      OT LONG TERM GOAL #6   Title  Pt. will write his name  efficiently with 100% legibility    Baseline  06/17/2019: Pt continues to present with limited with 50% legibility and efficiency when writing name to sign in when riding the [place]    Time  12    Period  Weeks    Status  On-going    Target Date  07/28/19      OT LONG TERM GOAL #7   Title  Pt will independently and consistently follow HEP to increase UE strength to increase functional independence    Baseline  Pt. continues to work towards independence with exercises    Time  12    Period  Weeks    Status  On-going    Target Date  07/28/19      OT LONG TERM GOAL #8   Title  Pt. will require supervision ironing a shirt.    Baseline  Pt. has progressed CGA, and complete set-up seated using a tabletop iron    Time  12    Period  Weeks    Status  On-going    Target Date  07/28/19      OT LONG TERM GOAL  #9   Baseline  Pt. will independently be able to sew a button onto a shirt.    Time  12    Period  Weeks    Status  On-going    Target Date  07/28/19      OT LONG TERM GOAL  #10   TITLE  Pt. will independently be able to throw a ball    Baseline  Pt. continues to be limited with throwing overhand, however pt. is able to perform underhand throwing.    Time  12    Period  Weeks    Status  On-going    Target Date  07/28/19      OT LONG TERM GOAL  #11   TITLE  Pt. will improve UE functional reaching to be able to independently use his RUE to hand his clothes in the closest.    Baseline  Pt. continues to be able to reach for clothing pulling them off of the hangers. Pt. is unable to reach up to place hangers onto the the rack.    Time  12    Period  Weeks    Status  On-going    Target Date  07/28/19      OT LONG TERM GOAL  #12   TITLE  Pt. will  be independent with shaving  using an Therapist, sports.    Baseline  Pt. is able to use the electric razer independently, however is unable to access the position of the plug and requires assist form caregivers secondary to the size of the  bathroom, and location of the plug.    Time  12    Period  Weeks    Status  Achieved    Target Date  07/28/19            Plan - 07/01/19 1441    Clinical Impression          OT Occupational Profile and History Pt. Is improving, and making steady progress with RUE functioning. Pt. Continues to present with limited right hand motor control, and [facility] skills. Pt. had difficulty coordinating grasping pegs from a horizontal position to a vertical position, and turning them to the correct position with the tips of his digits. Pt. Continues to work on improving RUE motor control, and [facility] skills, during ADLs, and IADL tasks, as well as improving writing legibility, letter, and sentence formation.    Problem Focused Assessment - Including review of records relating to presenting problem    Occupational performance deficits (Please refer to evaluation for details):  ADL's;IADL's    Body Structure / Function / Physical Skills  ADL;FMC;ROM;IADL;Pain;Coordination    Rehab Potential  Fair    Clinical Decision Making  Several treatment options, min-mod task modification necessary    Modification or Assistance to Complete Evaluation   Min-Moderate modification of tasks or assist with assess necessary to complete eval    OT Frequency  2x / week    OT Duration  12 weeks    OT Treatment/Interventions  Self-care/ADL training;Patient/family education;DME and/or AE instruction;Therapeutic exercise;Neuromuscular education    Consulted and Agree with Plan of Care  Patient       Patient will benefit from skilled therapeutic intervention in order to improve the following deficits and impairments:   Body Structure / Function / Physical Skills: ADL, FMC, ROM, IADL, Pain, Coordination       Visit Diagnosis: Muscle weakness (generalized)  Other lack of coordination    Problem List Patient Active Problem List   Diagnosis Date Noted  . Acute CVA (cerebrovascular accident) ([place]) 05/02/2016  .  Left-sided weakness 05/01/2016    Harrel Carina, MS, OTR/L 07/01/2019, 2:47 PM  [place] MAIN [facility] SERVICES [address] [place], [place], [address] Phone: [phone]   Fax:  [phone]  Name: RUEL DIMMICK MRN: [id number] Date of Birth: 06/25/1965

## 2019-07-05 ENCOUNTER — Ambulatory Visit: Payer: Medicare HMO | Attending: Internal Medicine

## 2019-07-05 DIAGNOSIS — Z23 Encounter for immunization: Secondary | ICD-10-CM

## 2019-07-05 NOTE — Progress Notes (Signed)
   Covid-19 Vaccination Clinic  Name:  Curtis Powell    MRN: [id number] DOB: 1965-11-02  07/05/2019  [name] was observed post Covid-19 immunization for 15 minutes without incident. He was provided with Vaccine Information Sheet and instruction to access the V-Safe system.   [name] was instructed to call 911 with any severe reactions post vaccine: Marland Kitchen Difficulty breathing  . Swelling of face and throat  . A fast heartbeat  . A bad rash all over body  . Dizziness and weakness   Immunizations Administered    Name Date Dose VIS Date Route   Pfizer COVID-19 Vaccine 07/05/2019 12:59 PM 0.3 mL 03/19/2019 Intramuscular   Manufacturer: [place]   Lot: [id number]   [place]: [id number]

## 2019-07-06 ENCOUNTER — Encounter: Payer: Self-pay | Admitting: Occupational Therapy

## 2019-07-06 ENCOUNTER — Ambulatory Visit: Payer: Medicare HMO | Admitting: Physical Therapy

## 2019-07-06 ENCOUNTER — Other Ambulatory Visit: Payer: Self-pay

## 2019-07-06 ENCOUNTER — Encounter: Payer: Self-pay | Admitting: Physical Therapy

## 2019-07-06 ENCOUNTER — Ambulatory Visit: Payer: Medicare HMO | Admitting: Occupational Therapy

## 2019-07-06 DIAGNOSIS — R278 Other lack of coordination: Secondary | ICD-10-CM

## 2019-07-06 DIAGNOSIS — R531 Weakness: Secondary | ICD-10-CM

## 2019-07-06 DIAGNOSIS — R2689 Other abnormalities of gait and mobility: Secondary | ICD-10-CM

## 2019-07-06 DIAGNOSIS — I639 Cerebral infarction, unspecified: Secondary | ICD-10-CM

## 2019-07-06 DIAGNOSIS — M6281 Muscle weakness (generalized): Secondary | ICD-10-CM

## 2019-07-06 DIAGNOSIS — I69351 Hemiplegia and hemiparesis following cerebral infarction affecting right dominant side: Secondary | ICD-10-CM

## 2019-07-06 NOTE — Therapy (Signed)
[place] MAIN [facility] SERVICES [address] [place], [place], [address] Phone: [phone]   Fax:  [phone]  Physical Therapy Treatment Physical Therapy Progress Note   Dates of reporting period  05/12/19  to 07/06/19  Patient Details  Name: Curtis Powell MRN: [id number] Date of Birth: [birth year] No data recorded  Encounter Date: 07/06/2019  PT End of Session - 07/06/19 1507    Visit Number  [id number]    Number of Visits  188    Date for PT Re-Evaluation  07/28/19    PT Start Time  0145    PT Stop Time  0225    PT Time Calculation (min)  40 min    Equipment Utilized During Treatment  Gait belt    Activity Tolerance  Patient tolerated treatment well;No increased pain    Behavior During Therapy  WFL for tasks assessed/performed       Past Medical History:  Diagnosis Date  . GERD (gastroesophageal reflux disease)   . Hyperlipidemia   . Hypertension   . Obesity   . Stroke [facility])     Past Surgical History:  Procedure Laterality Date  . BRAIN SURGERY      There were no vitals filed for this visit.  Subjective Assessment - 07/06/19 1506    Subjective  Patient reports doing well; Reports adherence with HEP; denies any pain; denies any new falls;    Currently in Pain?  No/denies    Pain Score  0-No pain      Treatment: Patient performed outcome measures to review goals below. He achieved goal #6 and is progressing towards all goals.   Patient performed with instruction, verbal cues, tactile cues of therapist: goal: increase tissue extensibility, promote proper posture, improve mobility                        PT Education - 07/06/19 1506    Education provided  Yes    Education Details  HEP    Person(s) Educated  Patient    Methods  Explanation;Demonstration    Comprehension  Returned demonstration;Need further instruction       PT Short Term Goals - 04/07/19 1504      PT SHORT TERM GOAL #1   Title  Patient  will be independent in home exercise program to improve strength/mobility for better functional independence with ADLs.    Baseline  hep compliant    Time  2    Period  Weeks    Status  Achieved      PT SHORT TERM GOAL #2   Title  Patient will require min cueing for STS transfer with CGA for increased independence with mobility.     Baseline  CGA    Time  2    Period  Weeks    Status  Achieved      PT SHORT TERM GOAL #3   Title  Patient will maintain upright posture for > 15 seconds to demonstrate strengthened postural control muscualture     Baseline  18 seconds     Time  2    Period  Weeks    Status  Achieved        PT Long Term Goals - 07/06/19 0001      PT LONG TERM GOAL #2   Title  Patient (< 71 years old) will complete five times sit to stand test in < 10 seconds indicating an increased LE strength and improved  balance.    Baseline  06/02/19: 19.93 seconds one hand on RW, one hand on w/c 12/30: 16 secpmds BUE support 02/11/19: 36 seconds 5/19: 20.6s from [facility], one hand on walker and one on WC, 80% upright posture 7/1: 22 seconds one hand on walker one hand on w/c  9/14: 48 seconds with full upright posture    Time  8    Period  Weeks    Status  On-going    Target Date  07/28/19      PT LONG TERM GOAL #3   Title  Patient will increase Berg Balance score by > 6 points (26/56) to demonstrate decreased fall risk during functional activities.    Baseline  06/02/19: 28/56 12/23: 25/56 9/12: 20/56 10/15: 21/56 11/7: 20/56 12/19: 22/56; 08/26/18: 11/56 7/1: 17/56  9/14: 23/56, 07/06/19=28/56    Time  8    Period  Weeks    Status  Partially Met    Target Date  07/28/19      PT LONG TERM GOAL #4   Title  Patient will increase BLE gross strength to 4+/5 as to improve functional strength for independent gait, increased standing tolerance and increased ADL ability.    Baseline   06/02/19: hip flexion 4-/5, extension/abd 3/5 12/30: hip flexion 4/5 hip extension and abduction 3/5 02/11/19:  hip flexion 4/5 hip extension and abduction 2+/54-/5 RLE, 10/15: 4/5 RLE 11/7: 4/5 RLE 3/3: 4/5 7/1:  hip flexion 4/5 hip extension 2/5 hip abduction/adduciton 2/5     Time  8    Period  Weeks    Status  On-going    Target Date  07/28/19      PT LONG TERM GOAL #5   Title  Patient will increase lower extremity functional scale to >40/80 to demonstrate improved functional mobility and increased tolerance with ADLs.     Baseline  06/02/19: 31/80 12/30: 35/80 02/10/19: 34/56 14/80; 12/26: 21/80; 2/18: 28/80  4/10; 25/80 5/29: 27/80; 7/16: 29/80 8/22: 30/80  9/12: 26/80 (feeling more tired due to moving) 10/15: 37/80 11/7: 23/80 3/3: 32/80; 08/25/18: 28/80 9/15: 32/56    Time  8    Period  Weeks    Status  On-going    Target Date  07/28/19      PT LONG TERM GOAL #6   Title  Patient will ambulate 150 ft with least assistive device and Supervision with no breaks to allow for increased mobility within home.    Baseline  2/24: ambulated 120 ft in previous session 12/30: unable to perform due to elevated HR 11/4: 105 ft with AFO after 10 MWT 11/7: 122f 12/19: 115 ft  3/3: will assess next session, has done 200 ft with one rest break; 08/26/18: Pt fatigued after 30' today 9/14: 100 ft with CGA, Patient ambulated 100 feet with supervision     Time  8    Period  Weeks    Status  Achieved    Target Date  07/06/19      PT LONG TERM GOAL #7   Title  Patient will perform 10 MWT in <30 seconds for improved gait speed, gait mechanics, and functional capacity for mobility.     Baseline  06/02/19: 62.5 seconds with RW 11/4: 56 seconds with AFO and RW 12/30: 53 seconds with AFO and RW     Time  8    Period  Weeks    Status  On-going    Target Date  07/28/19  Plan - 07/06/19 1507    Clinical Impression Statement  Patient's condition has the potential to improve in response to therapy. Maximum improvement is yet to be obtained. The anticipated improvement is attainable and reasonable in a  generally predictable time.  Patient reports that he is walking at home better. He reached goal #6, and is making progress towards all other goals. He will continue to benefit from skilled PT to improve mobility and strength.    Rehab Potential  Fair    Clinical Impairments Affecting Rehab Potential  hx of HTN, HLD, CVA, learning disability, Diabetes, brain tumor,     PT Frequency  2x / week    PT Duration  8 weeks    PT Treatment/Interventions  ADLs/Self Care Home Management;Aquatic Therapy;Ultrasound;Moist Heat;Traction;DME Instruction;Gait training;Stair training;Functional mobility training;Therapeutic activities;Therapeutic exercise;Orthotic Fit/Training;Neuromuscular re-education;Balance training;Patient/family education;Manual techniques;Wheelchair mobility training;Passive range of motion;Energy conservation;Taping;Visual/perceptual remediation/compensation    PT Next Visit Plan  progress stnading balance    PT Home Exercise Plan  TrA contraction, glute sets: needs updates. Potentially some time in supine flat in hospital bed, to improve hip extension deficits.     Consulted and Agree with Plan of Care  Patient       Patient will benefit from skilled therapeutic intervention in order to improve the following deficits and impairments:  Abnormal gait, Decreased activity tolerance, Decreased balance, Decreased knowledge of precautions, Decreased endurance, Decreased coordination, Decreased knowledge of use of DME, Decreased mobility, Decreased range of motion, Difficulty walking, Decreased safety awareness, Decreased strength, Impaired flexibility, Impaired perceived functional ability, Impaired tone, Postural dysfunction, Improper body mechanics, Pain  Visit Diagnosis: Muscle weakness (generalized)  Other lack of coordination  Other abnormalities of gait and mobility  Hemiplegia and hemiparesis following cerebral infarction affecting right dominant side (HCC)  Left-sided  weakness  Acute CVA (cerebrovascular accident) [facility])     Problem List Patient Active Problem List   Diagnosis Date Noted  . Acute CVA (cerebrovascular accident) ([place]) 05/02/2016  . Left-sided weakness 05/01/2016    Alanson Puls, [place] DPT 07/06/2019, 5:20 PM  [place] MAIN [facility] SERVICES [address] [place], [place], [address] Phone: [phone]   Fax:  [phone]  Name: Curtis Powell MRN: [id number] Date of Birth: 1965-12-05

## 2019-07-06 NOTE — Therapy (Signed)
[place] MAIN [facility] SERVICES [address] [place], [place], [address] Phone: [phone]   Fax:  [phone]  Occupational Therapy Treatment  Patient Details  Name: Curtis Powell MRN: [id number] Date of Birth: Aug 20, 1965 No data recorded  Encounter Date: 07/06/2019  OT End of Session - 07/06/19 1306    Visit Number  174    Number of Visits  209    Date for OT Re-Evaluation  07/28/19    Authorization Type  Progress reporting period starting 05/03/2019    OT Start Time  1300    OT Stop Time  1345    OT Time Calculation (min)  45 min    Equipment Utilized During Treatment  built up pen and adaptive scissors    Activity Tolerance  Patient tolerated treatment well    Behavior During Therapy  WFL for tasks assessed/performed       Past Medical History:  Diagnosis Date  . GERD (gastroesophageal reflux disease)   . Hyperlipidemia   . Hypertension   . Obesity   . Stroke [facility])     Past Surgical History:  Procedure Laterality Date  . BRAIN SURGERY      There were no vitals filed for this visit.  Subjective Assessment - 07/06/19 1305    Subjective   Pt. reports that he is doing well.    Patient is accompanied by:  Family member    Pertinent History  Pt. is a 54 y.o. male who suffered a CVA on 05/01/2016. Pt. was admitted to the hospital. Once discharged, he received Home Health PT and OT services for about a month. Pt. has had multiple CVAs over the past 8 years, and has had multiple falls in the past 6 months. Pt. resides in an apartment. Pt. has caregivers for 80 hours. Pt.'s mother stays with pt. at night, and assists with IADL tasks.      Currently in Pain?  No/denies       OT TREATMENT  Neuromuscular re-ed:  Pt. worked on grasping large flat shapes, and moving them through varying heights of vertical dowels. Pt. required increased rest breaks, and cues to avoid compensating  proximally with the right shoulder, and trunk. Pt.  worked on grasping, and positioning magnetic hooks on a whiteboard positioned at a vertical angle on on a height 11" from the tabletop surface. Pt. worked on [facility] skills grasping 1", 3/4", and 1/2" flat washers, and placing them on the hooks. Pt. worked on sorting, and stacking 1", 3/4", and 1/2" washers.   Response to Treatment:  Pt. reports that he received the 1st dose of the vaccine yesterday in the left arm. Pt. requires increased cues to avoid compensating proximally with hiking the right shoulder, and leaning to the left, or right when reaching.  Pt. continues to require assist manipulating small objects, storing them in his hand, and performing translatory movements. Pt. fatigues with his RUE elevated, and requires rest breaks. Pt. continues to work on improving RUE strength, and [facility] skills in order to work towards improving RUE functioning during ADLs, and IADL tasks.                     OT Education - 07/06/19 1306    Education provided  Yes    Education Details  [facility]    Person(s) Educated  Patient    Methods  Explanation;Demonstration    Comprehension  Verbalized understanding;Returned demonstration;Verbal cues required  OT Short Term Goals - 03/03/17 1459      OT SHORT TERM GOAL #1   Title  `        OT Long Term Goals - 06/17/19 1442      OT LONG TERM GOAL #4   Title  Pt. will perform self dressing with minA and A/E as needed.    Baseline  06/17/2019: Pt. continues to require assist with donning underwear over feet. Min-ModA donning his jacket. (Depending on the jacket)    Time  12    Period  Weeks    Status  Partially Met    Target Date  07/28/19      OT LONG TERM GOAL #6   Title  Pt. will write his name efficiently with 100% legibility    Baseline  06/17/2019: Pt continues to present with limited with 50% legibility and efficiency when writing name to sign in when riding the [place]    Time  12    Period  Weeks    Status  On-going    Target  Date  07/28/19      OT LONG TERM GOAL #7   Title  Pt will independently and consistently follow HEP to increase UE strength to increase functional independence    Baseline  Pt. continues to work towards independence with exercises    Time  12    Period  Weeks    Status  On-going    Target Date  07/28/19      OT LONG TERM GOAL #8   Title  Pt. will require supervision ironing a shirt.    Baseline  Pt. has progressed CGA, and complete set-up seated using a tabletop iron    Time  12    Period  Weeks    Status  On-going    Target Date  07/28/19      OT LONG TERM GOAL  #9   Baseline  Pt. will independently be able to sew a button onto a shirt.    Time  12    Period  Weeks    Status  On-going    Target Date  07/28/19      OT LONG TERM GOAL  #10   TITLE  Pt. will independently be able to throw a ball    Baseline  Pt. continues to be limited with throwing overhand, however pt. is able to perform underhand throwing.    Time  12    Period  Weeks    Status  On-going    Target Date  07/28/19      OT LONG TERM GOAL  #11   TITLE  Pt. will improve UE functional reaching to be able to independently use his RUE to hand his clothes in the closest.    Baseline  Pt. continues to be able to reach for clothing pulling them off of the hangers. Pt. is unable to reach up to place hangers onto the the rack.    Time  12    Period  Weeks    Status  On-going    Target Date  07/28/19      OT LONG TERM GOAL  #12   TITLE  Pt. will  be independent with shaving using an electric razer.    Baseline  Pt. is able to use the electric razer independently, however is unable to access the position of the plug and requires assist form caregivers secondary to the size of the bathroom, and location of the plug.  Time  12    Period  Weeks    Status  Achieved    Target Date  07/28/19            Plan - 07/06/19 1307    Clinical Impression Statement Pt. reports that he received the 1st dose of the vaccine  yesterday in the left arm. Pt. requires increased cues to avoid compensating proximally with hiking the right shoulder, and leaning to the left, or right when reaching.  Pt. continues to require assist manipulating small objects, storing them in his hand, and performing translatory movements. Pt. fatigues with his RUE elevated, and requires rest breaks. Pt. continues to work on improving RUE strength, and [facility] skills in order to work towards improving RUE functioning during ADLs, and IADL tasks.   OT Occupational Profile and History  Problem Focused Assessment - Including review of records relating to presenting problem    Occupational performance deficits (Please refer to evaluation for details):  ADL's;IADL's    Body Structure / Function / Physical Skills  ADL;FMC;ROM;IADL;Pain;Coordination    Rehab Potential  Fair    Clinical Decision Making  Several treatment options, min-mod task modification necessary    Comorbidities Affecting Occupational Performance:  May have comorbidities impacting occupational performance    Modification or Assistance to Complete Evaluation   Min-Moderate modification of tasks or assist with assess necessary to complete eval    OT Frequency  2x / week    OT Duration  12 weeks    OT Treatment/Interventions  Self-care/ADL training;Patient/family education;DME and/or AE instruction;Therapeutic exercise;Neuromuscular education    Consulted and Agree with Plan of Care  Patient       Patient will benefit from skilled therapeutic intervention in order to improve the following deficits and impairments:   Body Structure / Function / Physical Skills: ADL, FMC, ROM, IADL, Pain, Coordination       Visit Diagnosis: Muscle weakness (generalized)  Other lack of coordination    Problem List Patient Active Problem List   Diagnosis Date Noted  . Acute CVA (cerebrovascular accident) ([place]) 05/02/2016  . Left-sided weakness 05/01/2016    Harrel Carina, MS,  OTR/L 07/06/2019, 1:28 PM  [place] MAIN [facility] SERVICES [address] [place], [place], [address] Phone: [phone]   Fax:  [phone]  Name: Curtis Powell MRN: [id number] Date of Birth: 17-Sep-1965

## 2019-07-08 ENCOUNTER — Encounter: Payer: Self-pay | Admitting: Occupational Therapy

## 2019-07-08 ENCOUNTER — Other Ambulatory Visit: Payer: Self-pay

## 2019-07-08 ENCOUNTER — Ambulatory Visit: Payer: Medicare HMO | Admitting: Occupational Therapy

## 2019-07-08 ENCOUNTER — Ambulatory Visit: Payer: Medicare HMO | Attending: Family Medicine | Admitting: Physical Therapy

## 2019-07-08 ENCOUNTER — Encounter: Payer: Self-pay | Admitting: Physical Therapy

## 2019-07-08 DIAGNOSIS — R531 Weakness: Secondary | ICD-10-CM

## 2019-07-08 DIAGNOSIS — R2689 Other abnormalities of gait and mobility: Secondary | ICD-10-CM | POA: Diagnosis present

## 2019-07-08 DIAGNOSIS — I69351 Hemiplegia and hemiparesis following cerebral infarction affecting right dominant side: Secondary | ICD-10-CM

## 2019-07-08 DIAGNOSIS — M6281 Muscle weakness (generalized): Secondary | ICD-10-CM

## 2019-07-08 DIAGNOSIS — I639 Cerebral infarction, unspecified: Secondary | ICD-10-CM | POA: Diagnosis present

## 2019-07-08 DIAGNOSIS — R278 Other lack of coordination: Secondary | ICD-10-CM | POA: Diagnosis present

## 2019-07-08 NOTE — Therapy (Signed)
[place] MAIN [facility] SERVICES [address] [place], [place], [address] Phone: [phone]   Fax:  [phone]  Physical Therapy Treatment  Patient Details  Name: Curtis Powell MRN: [id number] Date of Birth: 07-27-1965 No data recorded  Encounter Date: 07/08/2019  PT End of Session - 07/08/19 1442    Visit Number  [id number]    Number of Visits  188    Date for PT Re-Evaluation  07/28/19    PT Start Time  0230    PT Stop Time  0310    PT Time Calculation (min)  40 min    Equipment Utilized During Treatment  Gait belt    Activity Tolerance  Patient tolerated treatment well;No increased pain    Behavior During Therapy  WFL for tasks assessed/performed       Past Medical History:  Diagnosis Date  . GERD (gastroesophageal reflux disease)   . Hyperlipidemia   . Hypertension   . Obesity   . Stroke [facility])     Past Surgical History:  Procedure Laterality Date  . BRAIN SURGERY      There were no vitals filed for this visit.  Subjective Assessment - 07/08/19 1441    Subjective  Patient reports doing well; Reports adherence with HEP; denies any pain; denies any new falls;    Pertinent History   Patient is a pleasant 54 year old male who presents to physical therapy for weakness and immobility secondary to CVA.  Had a stroke in Feb 2018. He was previously fully independent, but this stroke caused severe residual deficits, mainly on the right side as well as speech, and now he is unable to walk or perform most of his ADLs on his own. Entire right side is very weak. He still has a little difficulty with speech but his swallowing is improved to baseline. His mom had to move in with him and now is his main caregiver.     Limitations  Sitting;Lifting;Standing;Walking;House hold activities    How long can you sit comfortably?  5 minutes    How long can you stand comfortably?  5 minutes    How long can you walk comfortably?  10 ft    Patient Stated Goals   Walk without walker, walk further with walker, get some strength in back     Currently in Pain?  No/denies    Pain Score  0-No pain    Pain Onset  More than a month ago            Patient ambulates with RW 95 feet x 2 with 1 seated rest period and RW and lateral sway with decreased knee flex and hip flex. Therapeutic exercises Nu-step x 5 mins , L 3 Seated LAQ with 4 lbs x 20x 3 sets BLE Seated marching with 4 lbs x 20 x 2 BLE Pt educated throughout session about proper posture and technique with exercises. Improved exercise technique, movement at target joints, use of target muscles after min to mod verbal, visual, tactile cues. Patient performed with instruction, verbal cues, tactile cues of therapist: goal:increase tissue extensibility, promote proper posture, improve mobility                     PT Education - 07/08/19 1442    Education provided  Yes    Education Details  HEP    Person(s) Educated  Patient    Methods  Explanation;Demonstration       PT Short  Term Goals - 04/07/19 1504      PT SHORT TERM GOAL #1   Title  Patient will be independent in home exercise program to improve strength/mobility for better functional independence with ADLs.    Baseline  hep compliant    Time  2    Period  Weeks    Status  Achieved      PT SHORT TERM GOAL #2   Title  Patient will require min cueing for STS transfer with CGA for increased independence with mobility.     Baseline  CGA    Time  2    Period  Weeks    Status  Achieved      PT SHORT TERM GOAL #3   Title  Patient will maintain upright posture for > 15 seconds to demonstrate strengthened postural control muscualture     Baseline  18 seconds     Time  2    Period  Weeks    Status  Achieved        PT Long Term Goals - 07/06/19 0001      PT LONG TERM GOAL #2   Title  Patient (< 35 years old) will complete five times sit to stand test in < 10 seconds indicating an increased LE strength and  improved balance.    Baseline  06/02/19: 19.93 seconds one hand on RW, one hand on w/c 12/30: 16 secpmds BUE support 02/11/19: 36 seconds 5/19: 20.6s from [facility], one hand on walker and one on WC, 80% upright posture 7/1: 22 seconds one hand on walker one hand on w/c  9/14: 48 seconds with full upright posture    Time  8    Period  Weeks    Status  On-going    Target Date  07/28/19      PT LONG TERM GOAL #3   Title  Patient will increase Berg Balance score by > 6 points (26/56) to demonstrate decreased fall risk during functional activities.    Baseline  06/02/19: 28/56 12/23: 25/56 9/12: 20/56 10/15: 21/56 11/7: 20/56 12/19: 22/56; 08/26/18: 11/56 7/1: 17/56  9/14: 23/56, 07/06/19=28/56    Time  8    Period  Weeks    Status  Partially Met    Target Date  07/28/19      PT LONG TERM GOAL #4   Title  Patient will increase BLE gross strength to 4+/5 as to improve functional strength for independent gait, increased standing tolerance and increased ADL ability.    Baseline   06/02/19: hip flexion 4-/5, extension/abd 3/5 12/30: hip flexion 4/5 hip extension and abduction 3/5 02/11/19: hip flexion 4/5 hip extension and abduction 2+/54-/5 RLE, 10/15: 4/5 RLE 11/7: 4/5 RLE 3/3: 4/5 7/1:  hip flexion 4/5 hip extension 2/5 hip abduction/adduciton 2/5     Time  8    Period  Weeks    Status  On-going    Target Date  07/28/19      PT LONG TERM GOAL #5   Title  Patient will increase lower extremity functional scale to >40/80 to demonstrate improved functional mobility and increased tolerance with ADLs.     Baseline  06/02/19: 31/80 12/30: 35/80 02/10/19: 34/56 14/80; 12/26: 21/80; 2/18: 28/80  4/10; 25/80 5/29: 27/80; 7/16: 29/80 8/22: 30/80  9/12: 26/80 (feeling more tired due to moving) 10/15: 37/80 11/7: 23/80 3/3: 32/80; 08/25/18: 28/80 9/15: 32/56    Time  8    Period  Weeks    Status  On-going    Target Date  07/28/19      PT LONG TERM GOAL #6   Title  Patient will ambulate 150 ft with least assistive  device and Supervision with no breaks to allow for increased mobility within home.    Baseline  2/24: ambulated 120 ft in previous session 12/30: unable to perform due to elevated HR 11/4: 105 ft with AFO after 10 MWT 11/7: 158f 12/19: 115 ft  3/3: will assess next session, has done 200 ft with one rest break; 08/26/18: Pt fatigued after 30' today 9/14: 100 ft with CGA, Patient ambulated 100 feet with supervision     Time  8    Period  Weeks    Status  Achieved    Target Date  07/06/19      PT LONG TERM GOAL #7   Title  Patient will perform 10 MWT in <30 seconds for improved gait speed, gait mechanics, and functional capacity for mobility.     Baseline  06/02/19: 62.5 seconds with RW 11/4: 56 seconds with AFO and RW 12/30: 53 seconds with AFO and RW     Time  8    Period  Weeks    Status  On-going    Target Date  07/28/19            Plan - 07/08/19 1443    Clinical Impression Statement  Patient demonstrates decreased gait speed with deviations and requires verbal and tactile cueing to have a increased step height during ambulation.  Patient also requires CGA during mobility activities. Patient requires consistent cueing to maintain correct position during gait activities. Patient demonstrates difficulty with gait and increased lateral sway. Patient will continue to benefit from skilled therapy in order to improve dynamic standing balance and increase endurance..   Rehab Potential  Fair    Clinical Impairments Affecting Rehab Potential  hx of HTN, HLD, CVA, learning disability, Diabetes, brain tumor,     PT Frequency  2x / week    PT Duration  8 weeks    PT Treatment/Interventions  ADLs/Self Care Home Management;Aquatic Therapy;Ultrasound;Moist Heat;Traction;DME Instruction;Gait training;Stair training;Functional mobility training;Therapeutic activities;Therapeutic exercise;Orthotic Fit/Training;Neuromuscular re-education;Balance training;Patient/family education;Manual  techniques;Wheelchair mobility training;Passive range of motion;Energy conservation;Taping;Visual/perceptual remediation/compensation    PT Next Visit Plan  progress stnading balance    PT Home Exercise Plan  TrA contraction, glute sets: needs updates. Potentially some time in supine flat in hospital bed, to improve hip extension deficits.     Consulted and Agree with Plan of Care  Patient       Patient will benefit from skilled therapeutic intervention in order to improve the following deficits and impairments:  Abnormal gait, Decreased activity tolerance, Decreased balance, Decreased knowledge of precautions, Decreased endurance, Decreased coordination, Decreased knowledge of use of DME, Decreased mobility, Decreased range of motion, Difficulty walking, Decreased safety awareness, Decreased strength, Impaired flexibility, Impaired perceived functional ability, Impaired tone, Postural dysfunction, Improper body mechanics, Pain  Visit Diagnosis: Muscle weakness (generalized)  Other lack of coordination  Other abnormalities of gait and mobility  Hemiplegia and hemiparesis following cerebral infarction affecting right dominant side (HCC)  Left-sided weakness  Acute CVA (cerebrovascular accident) ([facility]     Problem List Patient Active Problem List   Diagnosis Date Noted  . [place] 05[place]  . Left-sided weakness 05/01/2016    MAlanson Puls PVirginiaDPT 07/08/2019, 3:09 PM  [place] [facility] [address] [address]  Phone: [phone]   Fax:  [phone]  Name: Curtis Powell MRN: [id number] Date of Birth: Oct 01, 1965

## 2019-07-08 NOTE — Therapy (Signed)
[place] MAIN [facility] SERVICES [address] [place], [place], [address] Phone: [phone]   Fax:  [phone]  Occupational Therapy Treatment  Patient Details  Name: Curtis Powell MRN: [id number] Date of Birth: 1965-12-15 No data recorded  Encounter Date: 07/08/2019  OT End of Session - 07/08/19 1611    Visit Number  [id number]    Number of Visits  209    Date for OT Re-Evaluation  07/28/19    Authorization Type  Progress reporting period starting 05/03/2019    OT Start Time  1605    OT Stop Time  1650    OT Time Calculation (min)  45 min    Activity Tolerance  Patient tolerated treatment well    Behavior During Therapy  [facility] for tasks assessed/performed       Past Medical History:  Diagnosis Date  . GERD (gastroesophageal reflux disease)   . Hyperlipidemia   . Hypertension   . Obesity   . Stroke [facility])     Past Surgical History:  Procedure Laterality Date  . BRAIN SURGERY      There were no vitals filed for this visit.  Subjective Assessment - 07/08/19 1609    Subjective   Pt. reports that he is doing well.    Patient is accompanied by:  Family member    Pertinent History  Pt. is a 54 y.o. male who suffered a CVA on 05/01/2016. Pt. was admitted to the hospital. Once discharged, he received Home Health PT and OT services for about a month. Pt. has had multiple CVAs over the past 8 years, and has had multiple falls in the past 6 months. Pt. resides in an apartment. Pt. has caregivers for 80 hours. Pt.'s mother stays with pt. at night, and assists with IADL tasks.      Patient Stated Goals  To be able to throw a ball and dribble a ball, do as much as I can for myself.     Currently in Pain?  No/denies       OT TREATMENT  Neuromuscular re-ed:  Pt. worked on grasping large flat shapes, and moving them through varying heights of vertical dowels.Pt. required increased rest breaks, and cues to avoid compensating  proximally with the right  shoulder, and trunk.Pt. worked on grasping, and positioning magnetic hooks on a whiteboard positioned at a vertical angle on on a height 11" from the tabletop surface. Pt. worked on [facility] skills grasping1", 3/4", and1/2" flat washers, and placing them on the hooks.Pt. worked on sorting, and stacking 1", 3/4", and 1/2" washers.   Response to Treatment:  Pt. reports that he had a headache yesterday which he thought may have been from the COVID vaccine but was fine today. Pt. requires increased cues to avoid compensating proximally with hiking the right shoulder, and leaning to the left, or right when reaching with improved awareness with mirror in front of him.  Pt. continues to require assist manipulating small objects, storing them in his hand, and performing translatory movements. Pt. fatigues with his RUE elevated, and requires rest breaks. Pt. continues to work on improving RUE strength, and [facility] skills in order to work towards improving RUE functioning during ADLs, and IADL tasks                     OT Education - 07/08/19 1611    Education provided  Yes    Education Details  [facility]    Person(s) Educated  Patient    Methods  Explanation;Demonstration    Comprehension  Verbalized understanding;Returned demonstration;Verbal cues required       OT Short Term Goals - 03/03/17 1459      OT SHORT TERM GOAL #1   Title  `        OT Long Term Goals - 06/17/19 1442      OT LONG TERM GOAL #4   Title  Pt. will perform self dressing with minA and A/E as needed.    Baseline  06/17/2019: Pt. continues to require assist with donning underwear over feet. Min-ModA donning his jacket. (Depending on the jacket)    Time  12    Period  Weeks    Status  Partially Met    Target Date  07/28/19      OT LONG TERM GOAL #6   Title  Pt. will write his name efficiently with 100% legibility    Baseline  06/17/2019: Pt continues to present with limited with 50% legibility and efficiency  when writing name to sign in when riding the [facility]    Time  12    Period  Weeks    Status  On-going    Target Date  07/28/19      OT LONG TERM GOAL #7   Title  Pt will independently and consistently follow HEP to increase UE strength to increase functional independence    Baseline  Pt. continues to work towards independence with exercises    Time  12    Period  Weeks    Status  On-going    Target Date  07/28/19      OT LONG TERM GOAL #8   Title  Pt. will require supervision ironing a shirt.    Baseline  Pt. has progressed CGA, and complete set-up seated using a tabletop iron    Time  12    Period  Weeks    Status  On-going    Target Date  07/28/19      OT LONG TERM GOAL  #9   Baseline  Pt. will independently be able to sew a button onto a shirt.    Time  12    Period  Weeks    Status  On-going    Target Date  07/28/19      OT LONG TERM GOAL  #10   TITLE  Pt. will independently be able to throw a ball    Baseline  Pt. continues to be limited with throwing overhand, however pt. is able to perform underhand throwing.    Time  12    Period  Weeks    Status  On-going    Target Date  07/28/19      OT LONG TERM GOAL  #11   TITLE  Pt. will improve UE functional reaching to be able to independently use his RUE to hand his clothes in the closest.    Baseline  Pt. continues to be able to reach for clothing pulling them off of the hangers. Pt. is unable to reach up to place hangers onto the the rack.    Time  12    Period  Weeks    Status  On-going    Target Date  07/28/19      OT LONG TERM GOAL  #12   TITLE  Pt. will  be independent with shaving using an electric razer.    Baseline  Pt. is able to use the electric razer independently, however is unable to  access the position of the plug and requires assist form caregivers secondary to the size of the bathroom, and location of the plug.    Time  12    Period  Weeks    Status  Achieved    Target Date  07/28/19             Plan - 07/08/19 1616    Clinical Impression Statement  Pt reports he had a headache yesterday which could have been from COVID vaccine but is better today. He continues to be motivated to regain functional use of R shoulder and doing better with not hiking his R shoulder when reaching, grasping and placing small items at various heights.  He continues to lean to the left but this improves when he is in front of the mirror. Functional endurance for maintaining elevated RUE continue to be addressed during sessions and improving RUE strength and [facility] skills for improving function during ADLs and IADLs.    OT Occupational Profile and History  Problem Focused Assessment - Including review of records relating to presenting problem    Occupational performance deficits (Please refer to evaluation for details):  ADL's;IADL's    Body Structure / Function / Physical Skills  ADL;FMC;ROM;IADL;Pain;Coordination    Rehab Potential  Fair    Clinical Decision Making  Several treatment options, min-mod task modification necessary    Comorbidities Affecting Occupational Performance:  May have comorbidities impacting occupational performance    Modification or Assistance to Complete Evaluation   Min-Moderate modification of tasks or assist with assess necessary to complete eval    OT Frequency  2x / week    OT Duration  12 weeks    OT Treatment/Interventions  Self-care/ADL training;Patient/family education;DME and/or AE instruction;Therapeutic exercise;Neuromuscular education    Consulted and Agree with Plan of Care  Patient       Patient will benefit from skilled therapeutic intervention in order to improve the following deficits and impairments:   Body Structure / Function / Physical Skills: ADL, FMC, ROM, IADL, Pain, Coordination       Visit Diagnosis: Other lack of coordination  Muscle weakness (generalized)  Left-sided weakness    Problem List Patient Active Problem List   Diagnosis  Date Noted  . Acute CVA (cerebrovascular accident) ([place]) 05/02/2016  . Left-sided weakness 05/01/2016   Chrys Racer, OTR/L, [facility] ascom [phone] 07/08/19, 4:27 PM  [place] MAIN [facility] SERVICES [address] [place] [address] [place], [place], [address] Phone: [phone]   Fax:  [phone]  Name: Curtis Powell MRN: [id number] Date of Birth: 09-Mar-1966

## 2019-07-12 ENCOUNTER — Ambulatory Visit: Payer: Medicare HMO | Admitting: Occupational Therapy

## 2019-07-12 ENCOUNTER — Other Ambulatory Visit: Payer: Self-pay

## 2019-07-12 ENCOUNTER — Ambulatory Visit: Payer: Medicare HMO

## 2019-07-12 DIAGNOSIS — M6281 Muscle weakness (generalized): Secondary | ICD-10-CM | POA: Diagnosis not present

## 2019-07-12 DIAGNOSIS — R2689 Other abnormalities of gait and mobility: Secondary | ICD-10-CM

## 2019-07-12 DIAGNOSIS — R278 Other lack of coordination: Secondary | ICD-10-CM

## 2019-07-12 DIAGNOSIS — R531 Weakness: Secondary | ICD-10-CM

## 2019-07-12 DIAGNOSIS — I69351 Hemiplegia and hemiparesis following cerebral infarction affecting right dominant side: Secondary | ICD-10-CM

## 2019-07-12 NOTE — Therapy (Signed)
[place] MAIN [facility] SERVICES [address] [place], [place], [address] Phone: [phone]   Fax:  [phone]  Physical Therapy Treatment  Patient Details  Name: Curtis Powell MRN: [id number] Date of Birth: Oct 24, 1965 No data recorded  Encounter Date: 07/12/2019  PT End of Session - 07/12/19 1353    Visit Number  [id number]    Number of Visits  188    Date for PT Re-Evaluation  07/28/19    PT Start Time  1346    PT Stop Time  1429    PT Time Calculation (min)  43 min    Equipment Utilized During Treatment  Gait belt    Activity Tolerance  Patient tolerated treatment well;No increased pain    Behavior During Therapy  WFL for tasks assessed/performed       Past Medical History:  Diagnosis Date  . GERD (gastroesophageal reflux disease)   . Hyperlipidemia   . Hypertension   . Obesity   . Stroke [facility])     Past Surgical History:  Procedure Laterality Date  . BRAIN SURGERY      There were no vitals filed for this visit.  Subjective Assessment - 07/12/19 1352    Subjective  Patient reports no falls or LOB since last session. Denies pain, reports compliance with HEP.    Pertinent History   Patient is a pleasant 54 year old male who presents to physical therapy for weakness and immobility secondary to CVA.  Had a stroke in Feb 2018. He was previously fully independent, but this stroke caused severe residual deficits, mainly on the right side as well as speech, and now he is unable to walk or perform most of his ADLs on his own. Entire right side is very weak. He still has a little difficulty with speech but his swallowing is improved to baseline. His mom had to move in with him and now is his main caregiver.     Limitations  Sitting;Lifting;Standing;Walking;House hold activities    How long can you sit comfortably?  5 minutes    How long can you stand comfortably?  5 minutes    How long can you walk comfortably?  10 ft    Patient Stated Goals   Walk without walker, walk further with walker, get some strength in back     Currently in Pain?  No/denies             Treatment:    Patient ambulates with RW 100 ft with CGA, cues for shift of weight with progression of limb and upright posture.  and lateral sway with decreased knee flex and hip flex. Upon assistance of w/c follow tactile cueing to PSIS and ASIS for optimal rotation and progression.   Nu-step Lvl 4 RPM>70 for cardiovascular support/challenge , 4 minutes  5lb ankle weight: -standing hip flexion/marching with RW 10x each LE ; cues for upright posture  -standing hip extension with RW 10x each LE. ; very challenging with LLE due to R knee hyperextending with weight shift Seated LAQ 15x each LE 3 second hold into extension  Standing without UE support, use of UE's to stabilize self 78 seconds   Sit to stand with decreasing reliance upon UE's. 2x, single UE support able to stabilize self once in standing without UE support with Min A   Pt educated throughout session about proper posture and technique with exercises. Improved exercise technique, movement at target joints, use of target muscles after min to mod  verbal, visual, tactile cues.  Patient performed with instruction, verbal cues, tactile cues of therapist: goal: increase tissue extensibility, promote proper posture, improve mobility                  PT Education - 07/12/19 1353    Education provided  Yes    Education Details  exercise technique, body mechanics    Person(s) Educated  Patient    Methods  Explanation;Demonstration;Tactile cues;Verbal cues    Comprehension  Verbalized understanding;Returned demonstration;Verbal cues required;Tactile cues required       PT Short Term Goals - 04/07/19 1504      PT SHORT TERM GOAL #1   Title  Patient will be independent in home exercise program to improve strength/mobility for better functional independence with ADLs.    Baseline  hep compliant     Time  2    Period  Weeks    Status  Achieved      PT SHORT TERM GOAL #2   Title  Patient will require min cueing for STS transfer with CGA for increased independence with mobility.     Baseline  CGA    Time  2    Period  Weeks    Status  Achieved      PT SHORT TERM GOAL #3   Title  Patient will maintain upright posture for > 15 seconds to demonstrate strengthened postural control muscualture     Baseline  18 seconds     Time  2    Period  Weeks    Status  Achieved        PT Long Term Goals - 07/06/19 0001      PT LONG TERM GOAL #2   Title  Patient (< 62 years old) will complete five times sit to stand test in < 10 seconds indicating an increased LE strength and improved balance.    Baseline  06/02/19: 19.93 seconds one hand on RW, one hand on w/c 12/30: 16 secpmds BUE support 02/11/19: 36 seconds 5/19: 20.6s from [facility], one hand on walker and one on WC, 80% upright posture 7/1: 22 seconds one hand on walker one hand on w/c  9/14: 48 seconds with full upright posture    Time  8    Period  Weeks    Status  On-going    Target Date  07/28/19      PT LONG TERM GOAL #3   Title  Patient will increase Berg Balance score by > 6 points (26/56) to demonstrate decreased fall risk during functional activities.    Baseline  06/02/19: 28/56 12/23: 25/56 9/12: 20/56 10/15: 21/56 11/7: 20/56 12/19: 22/56; 08/26/18: 11/56 7/1: 17/56  9/14: 23/56, 07/06/19=28/56    Time  8    Period  Weeks    Status  Partially Met    Target Date  07/28/19      PT LONG TERM GOAL #4   Title  Patient will increase BLE gross strength to 4+/5 as to improve functional strength for independent gait, increased standing tolerance and increased ADL ability.    Baseline   06/02/19: hip flexion 4-/5, extension/abd 3/5 12/30: hip flexion 4/5 hip extension and abduction 3/5 02/11/19: hip flexion 4/5 hip extension and abduction 2+/54-/5 RLE, 10/15: 4/5 RLE 11/7: 4/5 RLE 3/3: 4/5 7/1:  hip flexion 4/5 hip extension 2/5 hip  abduction/adduciton 2/5     Time  8    Period  Weeks    Status  On-going  Target Date  07/28/19      PT LONG TERM GOAL #5   Title  Patient will increase lower extremity functional scale to >40/80 to demonstrate improved functional mobility and increased tolerance with ADLs.     Baseline  06/02/19: 31/80 12/30: 35/80 02/10/19: 34/56 14/80; 12/26: 21/80; 2/18: 28/80  4/10; 25/80 5/29: 27/80; 7/16: 29/80 8/22: 30/80  9/12: 26/80 (feeling more tired due to moving) 10/15: 37/80 11/7: 23/80 3/3: 32/80; 08/25/18: 28/80 9/15: 32/56    Time  8    Period  Weeks    Status  On-going    Target Date  07/28/19      PT LONG TERM GOAL #6   Title  Patient will ambulate 150 ft with least assistive device and Supervision with no breaks to allow for increased mobility within home.    Baseline  2/24: ambulated 120 ft in previous session 12/30: unable to perform due to elevated HR 11/4: 105 ft with AFO after 10 MWT 11/7: 133f 12/19: 115 ft  3/3: will assess next session, has done 200 ft with one rest break; 08/26/18: Pt fatigued after 30' today 9/14: 100 ft with CGA, Patient ambulated 100 feet with supervision     Time  8    Period  Weeks    Status  Achieved    Target Date  07/06/19      PT LONG TERM GOAL #7   Title  Patient will perform 10 MWT in <30 seconds for improved gait speed, gait mechanics, and functional capacity for mobility.     Baseline  06/02/19: 62.5 seconds with RW 11/4: 56 seconds with AFO and RW 12/30: 53 seconds with AFO and RW     Time  8    Period  Weeks    Status  On-going    Target Date  07/28/19            Plan - 07/12/19 1422    Clinical Impression Statement  Patient presents to physical therapy with excellent motivation. When he fatigues his RLE demonstrates increased episodes of hyperextension upon weight acceptance despite use of AFO. Posterior chain musculature is challenging for patient due to forward trunk positioning of patient.Patient will continue to benefit from  skilled therapy in order to improve dynamic standing balance and increase endurance    Rehab Potential  Fair    Clinical Impairments Affecting Rehab Potential  hx of HTN, HLD, CVA, learning disability, Diabetes, brain tumor,     PT Frequency  2x / week    PT Duration  8 weeks    PT Treatment/Interventions  ADLs/Self Care Home Management;Aquatic Therapy;Ultrasound;Moist Heat;Traction;DME Instruction;Gait training;Stair training;Functional mobility training;Therapeutic activities;Therapeutic exercise;Orthotic Fit/Training;Neuromuscular re-education;Balance training;Patient/family education;Manual techniques;Wheelchair mobility training;Passive range of motion;Energy conservation;Taping;Visual/perceptual remediation/compensation    PT Next Visit Plan  progress stnading balance    PT Home Exercise Plan  TrA contraction, glute sets: needs updates. Potentially some time in supine flat in hospital bed, to improve hip extension deficits.     Consulted and Agree with Plan of Care  Patient       Patient will benefit from skilled therapeutic intervention in order to improve the following deficits and impairments:  Abnormal gait, Decreased activity tolerance, Decreased balance, Decreased knowledge of precautions, Decreased endurance, Decreased coordination, Decreased knowledge of use of DME, Decreased mobility, Decreased range of motion, Difficulty walking, Decreased safety awareness, Decreased strength, Impaired flexibility, Impaired perceived functional ability, Impaired tone, Postural dysfunction, Improper body mechanics, Pain  Visit Diagnosis: Other lack of coordination  Muscle weakness (generalized)  Left-sided weakness  Other abnormalities of gait and mobility     Problem List Patient Active Problem List   Diagnosis Date Noted  . Acute CVA (cerebrovascular accident) ([place]) 05/02/2016  . Left-sided weakness 05/01/2016  Janna Arch, PT, DPT    07/12/2019, 2:32 PM  [place] MAIN [facility] SERVICES [address] [place], [place], [address] Phone: [phone]   Fax:  [phone]  Name: Curtis Powell MRN: [id number] Date of Birth: Sep 05, 1965

## 2019-07-14 ENCOUNTER — Other Ambulatory Visit: Payer: Self-pay

## 2019-07-14 ENCOUNTER — Ambulatory Visit: Payer: Medicare HMO

## 2019-07-14 ENCOUNTER — Ambulatory Visit: Payer: Medicare HMO | Admitting: Occupational Therapy

## 2019-07-14 ENCOUNTER — Encounter: Payer: Self-pay | Admitting: Occupational Therapy

## 2019-07-14 DIAGNOSIS — M6281 Muscle weakness (generalized): Secondary | ICD-10-CM

## 2019-07-14 DIAGNOSIS — R531 Weakness: Secondary | ICD-10-CM

## 2019-07-14 DIAGNOSIS — R278 Other lack of coordination: Secondary | ICD-10-CM

## 2019-07-14 DIAGNOSIS — I69351 Hemiplegia and hemiparesis following cerebral infarction affecting right dominant side: Secondary | ICD-10-CM

## 2019-07-14 NOTE — Therapy (Signed)
[place] [facility] SERVICES [address] [place], [place], [address] Phone: [phone]   Fax:  [phone]  Occupational Therapy Treatment  Patient Details  Name: Curtis Powell MRN: [id number] Date of Birth: 04-14-1965 No data recorded  Encounter Date: 07/12/2019  OT End of Session - 07/13/19 1405    Visit Number  [id number]    Number of Visits  209    Date for OT Re-Evaluation  07/28/19    Authorization Type  Progress reporting period starting 05/03/2019    OT Start Time  1430    OT Stop Time  1520    OT Time Calculation (min)  50 min    Activity Tolerance  Patient tolerated treatment well    Behavior During Therapy  [facility] for tasks assessed/performed       Past Medical History:  Diagnosis Date  . GERD (gastroesophageal reflux disease)   . Hyperlipidemia   . Hypertension   . Obesity   . Stroke [facility])     Past Surgical History:  Procedure Laterality Date  . BRAIN SURGERY      There were no vitals filed for this visit.  Subjective Assessment - 07/13/19 1404    Subjective   Patient denies any pain.  Reports he is doing well, had a good Easter.    Pertinent History  Pt. is a 54 y.o. male who suffered a CVA on 05/01/2016. Pt. was admitted to the hospital. Once discharged, he received Home Health PT and OT services for about a month. Pt. has had multiple CVAs over the past 8 years, and has had multiple falls in the past 6 months. Pt. resides in an apartment. Pt. has caregivers for 80 hours. Pt.'s mother stays with pt. at night, and assists with IADL tasks.      Patient Stated Goals  To be able to throw a ball and dribble a ball, do as much as I can for myself.     Currently in Pain?  No/denies    Pain Score  0-No pain      Patient seen for ROM and strengthening with use of  Shape tower to 3rd level with 1# wrist weight, unable to reach from a seated position for the 4th tallest setting. Performed graduated levels and then return of each  level.    Patient seen for fine motor coordination and manipulation of Large "pills" with  opening pill bottle with child safety lock and removing pill sized objects, then picking up and placing back into container, difficulty with full supination of the right arm therefore dropping items frequently.  Worked towards translatory skills of the hand as well as using the hand for storage. Patient seen for manipulation of Grooved pegs to pick up and turn to place into board, cues for prehension patterns and turning.    Patient seen for Toileting skills, min assist this date to manage clothing after toileting.  Able to complete transfer from sit to stand with use of grab bar.    Response to tx:   Patient fatigued quickly with use of 1# wrist weight with reaching activity.  ABle to complete 3 of graduated levels from seated position.  Patient continues to demo difficulty with manipulation skills and drops items frequently. Cues for supination of forearm to hold hand flat to keep objects in hand.  Patient responds well to cues.  Continue to work towards goals in plan of care to increase independence in daily tasks.  OT Education - 07/14/19 1405    Education provided  Yes    Education Details  HEP, [place], reaching    Person(s) Educated  Patient    Methods  Explanation;Demonstration    Comprehension  Verbalized understanding;Returned demonstration;Verbal cues required       OT Short Term Goals - 03/03/17 1459      OT SHORT TERM GOAL #1   Title  `        OT Long Term Goals - 06/17/19 1442      OT LONG TERM GOAL #4   Title  Pt. will perform self dressing with minA and A/E as needed.    Baseline  06/17/2019: Pt. continues to require assist with donning underwear over feet. Min-ModA donning his jacket. (Depending on the jacket)    Time  12    Period  Weeks    Status  Partially Met    Target Date  07/28/19      OT LONG TERM GOAL #6   Title  Pt. will write his  name efficiently with 100% legibility    Baseline  06/17/2019: Pt continues to present with limited with 50% legibility and efficiency when writing name to sign in when riding the [place]    Time  12    Period  Weeks    Status  On-going    Target Date  07/28/19      OT LONG TERM GOAL #7   Title  Pt will independently and consistently follow HEP to increase UE strength to increase functional independence    Baseline  Pt. continues to work towards independence with exercises    Time  12    Period  Weeks    Status  On-going    Target Date  07/28/19      OT LONG TERM GOAL #8   Title  Pt. will require supervision ironing a shirt.    Baseline  Pt. has progressed CGA, and complete set-up seated using a tabletop iron    Time  12    Period  Weeks    Status  On-going    Target Date  07/28/19      OT LONG TERM GOAL  #9   Baseline  Pt. will independently be able to sew a button onto a shirt.    Time  12    Period  Weeks    Status  On-going    Target Date  07/28/19      OT LONG TERM GOAL  #10   TITLE  Pt. will independently be able to throw a ball    Baseline  Pt. continues to be limited with throwing overhand, however pt. is able to perform underhand throwing.    Time  12    Period  Weeks    Status  On-going    Target Date  07/28/19      OT LONG TERM GOAL  #11   TITLE  Pt. will improve UE functional reaching to be able to independently use his RUE to hand his clothes in the closest.    Baseline  Pt. continues to be able to reach for clothing pulling them off of the hangers. Pt. is unable to reach up to place hangers onto the the rack.    Time  12    Period  Weeks    Status  On-going    Target Date  07/28/19      OT LONG TERM GOAL  #12   TITLE  Pt. will  be independent with shaving using an Therapist, sports.    Baseline  Pt. is able to use the electric razer independently, however is unable to access the position of the plug and requires assist form caregivers secondary to the size of the  bathroom, and location of the plug.    Time  12    Period  Weeks    Status  Achieved    Target Date  07/28/19            Plan - 07/14/19 1406    Clinical Impression Statement  Patient fatigued quickly with use of 1# wrist weight with reaching activity.  ABle to complete 3 of graduated levels from seated position.  Patient continues to demo difficulty with manipulation skills and drops items frequently. Cues for supination of forearm to hold hand flat to keep objects in hand.  Patient responds well to cues.  Continue to work towards goals in plan of care to increase independence in daily tasks.    OT Occupational Profile and History  Problem Focused Assessment - Including review of records relating to presenting problem    Occupational performance deficits (Please refer to evaluation for details):  ADL's;IADL's    Body Structure / Function / Physical Skills  ADL;FMC;ROM;IADL;Pain;Coordination    Rehab Potential  Fair    Clinical Decision Making  Several treatment options, min-mod task modification necessary    Comorbidities Affecting Occupational Performance:  May have comorbidities impacting occupational performance    Modification or Assistance to Complete Evaluation   Min-Moderate modification of tasks or assist with assess necessary to complete eval    OT Duration  12 weeks    OT Treatment/Interventions  Self-care/ADL training;Patient/family education;DME and/or AE instruction;Therapeutic exercise;Neuromuscular education    Consulted and Agree with Plan of Care  Patient       Patient will benefit from skilled therapeutic intervention in order to improve the following deficits and impairments:   Body Structure / Function / Physical Skills: ADL, FMC, ROM, IADL, Pain, Coordination       Visit Diagnosis: Muscle weakness (generalized)  Other lack of coordination  Hemiplegia and hemiparesis following cerebral infarction affecting right dominant side [facility])    Problem  List Patient Active Problem List   Diagnosis Date Noted  . Acute CVA (cerebrovascular accident) ([place]) 05/02/2016  . Left-sided weakness 05/01/2016   Merna Baldi T [name], OTR/L, CLT  Rayan Dyal 07/14/2019, 2:25 PM  [place] [facility] SERVICES [address] [place], [place], [address] Phone: [phone]   Fax:  [phone]  Name: Curtis Powell MRN: [id number] Date of Birth: 04/17/1965

## 2019-07-14 NOTE — Therapy (Signed)
[place] MAIN [facility] SERVICES [address] [place], [place], [address] Phone: [phone]   Fax:  [phone]  Physical Therapy Treatment  Patient Details  Name: Curtis Powell MRN: [id number] Date of Birth: 11/29/1965 No data recorded  Encounter Date: 07/14/2019  PT End of Session - 07/14/19 1347    Visit Number  [id number]    Number of Visits  188    Date for PT Re-Evaluation  07/28/19    PT Start Time  1340    PT Stop Time  1429    PT Time Calculation (min)  49 min    Equipment Utilized During Treatment  Gait belt    Activity Tolerance  Patient tolerated treatment well;No increased pain    Behavior During Therapy  WFL for tasks assessed/performed       Past Medical History:  Diagnosis Date  . GERD (gastroesophageal reflux disease)   . Hyperlipidemia   . Hypertension   . Obesity   . Stroke [facility])     Past Surgical History:  Procedure Laterality Date  . BRAIN SURGERY      There were no vitals filed for this visit.  Subjective Assessment - 07/14/19 1346    Subjective  Patient reports no fall or LOB since last session. Reports extreme fatigue after last session but no pain, went home and went to bed.    Pertinent History   Patient is a pleasant 54 year old male who presents to physical therapy for weakness and immobility secondary to CVA.  Had a stroke in Feb 2018. He was previously fully independent, but this stroke caused severe residual deficits, mainly on the right side as well as speech, and now he is unable to walk or perform most of his ADLs on his own. Entire right side is very weak. He still has a little difficulty with speech but his swallowing is improved to baseline. His mom had to move in with him and now is his main caregiver.     Limitations  Sitting;Lifting;Standing;Walking;House hold activities    How long can you sit comfortably?  5 minutes    How long can you stand comfortably?  5 minutes    How long can you walk  comfortably?  10 ft    Patient Stated Goals  Walk without walker, walk further with walker, get some strength in back     Currently in Pain?  No/denies            Treatment:     Patient ambulates with RW 120 ft with CGA, cues for shift of weight with progression of limb and upright posture.  and lateral sway with decreased knee flex and hip flex. Upon assistance of w/c follow tactile cueing to PSIS and ASIS for optimal rotation and progression.    Nu-step Lvl 4 RPM>70 for cardiovascular support/challenge , 4 minutes   5lb ankle weight: -standing hip flexion/marching with RW 10x each LE ; cues for upright posture  -standing hip extension with RW 10x each LE. ; very challenging with LLE due to R knee hyperextending with weight shift -Seated LAQ 15x each LE 3 second hold into extension -Seated marching with back away from back of chair 10x each LE.    Seated alternating UE/LE , very challenging for patient to perform cross body coordination tasks.   Standing without UE support, use of UE's to stabilize self ~60-80 seconds    Sit to stand with decreasing reliance upon UE's. 2x, single UE  support able to stabilize self once in standing without UE support with Min A    Vitals monitored throughout physical therapy with occasional rest breaks to return to therapeutic range. Able to return quickly with seated rest break.   Pt educated throughout session about proper posture and technique with exercises. Improved exercise technique, movement at target joints, use of target muscles after min to mod verbal, visual, tactile cues.   Patient performed with instruction, verbal cues, tactile cues of therapist: goal: increase tissue extensibility, promote proper posture, improve mobility                  PT Education - 07/14/19 1347    Education provided  Yes    Education Details  exercise technique, body mechanics    Person(s) Educated  Patient    Methods   Explanation;Demonstration;Tactile cues;Verbal cues    Comprehension  Verbalized understanding;Returned demonstration;Verbal cues required;Tactile cues required       PT Short Term Goals - 04/07/19 1504      PT SHORT TERM GOAL #1   Title  Patient will be independent in home exercise program to improve strength/mobility for better functional independence with ADLs.    Baseline  hep compliant    Time  2    Period  Weeks    Status  Achieved      PT SHORT TERM GOAL #2   Title  Patient will require min cueing for STS transfer with CGA for increased independence with mobility.     Baseline  CGA    Time  2    Period  Weeks    Status  Achieved      PT SHORT TERM GOAL #3   Title  Patient will maintain upright posture for > 15 seconds to demonstrate strengthened postural control muscualture     Baseline  18 seconds     Time  2    Period  Weeks    Status  Achieved        PT Long Term Goals - 07/06/19 0001      PT LONG TERM GOAL #2   Title  Patient (< 38 years old) will complete five times sit to stand test in < 10 seconds indicating an increased LE strength and improved balance.    Baseline  06/02/19: 19.93 seconds one hand on RW, one hand on w/c 12/30: 16 secpmds BUE support 02/11/19: 36 seconds 5/19: 20.6s from [facility], one hand on walker and one on WC, 80% upright posture 7/1: 22 seconds one hand on walker one hand on w/c  9/14: 48 seconds with full upright posture    Time  8    Period  Weeks    Status  On-going    Target Date  07/28/19      PT LONG TERM GOAL #3   Title  Patient will increase Berg Balance score by > 6 points (26/56) to demonstrate decreased fall risk during functional activities.    Baseline  06/02/19: 28/56 12/23: 25/56 9/12: 20/56 10/15: 21/56 11/7: 20/56 12/19: 22/56; 08/26/18: 11/56 7/1: 17/56  9/14: 23/56, 07/06/19=28/56    Time  8    Period  Weeks    Status  Partially Met    Target Date  07/28/19      PT LONG TERM GOAL #4   Title  Patient will increase BLE gross  strength to 4+/5 as to improve functional strength for independent gait, increased standing tolerance and increased ADL ability.    Baseline  06/02/19: hip flexion 4-/5, extension/abd 3/5 12/30: hip flexion 4/5 hip extension and abduction 3/5 02/11/19: hip flexion 4/5 hip extension and abduction 2+/54-/5 RLE, 10/15: 4/5 RLE 11/7: 4/5 RLE 3/3: 4/5 7/1:  hip flexion 4/5 hip extension 2/5 hip abduction/adduciton 2/5     Time  8    Period  Weeks    Status  On-going    Target Date  07/28/19      PT LONG TERM GOAL #5   Title  Patient will increase lower extremity functional scale to >40/80 to demonstrate improved functional mobility and increased tolerance with ADLs.     Baseline  06/02/19: 31/80 12/30: 35/80 02/10/19: 34/56 14/80; 12/26: 21/80; 2/18: 28/80  4/10; 25/80 5/29: 27/80; 7/16: 29/80 8/22: 30/80  9/12: 26/80 (feeling more tired due to moving) 10/15: 37/80 11/7: 23/80 3/3: 32/80; 08/25/18: 28/80 9/15: 32/56    Time  8    Period  Weeks    Status  On-going    Target Date  07/28/19      PT LONG TERM GOAL #6   Title  Patient will ambulate 150 ft with least assistive device and Supervision with no breaks to allow for increased mobility within home.    Baseline  2/24: ambulated 120 ft in previous session 12/30: unable to perform due to elevated HR 11/4: 105 ft with AFO after 10 MWT 11/7: 185f 12/19: 115 ft  3/3: will assess next session, has done 200 ft with one rest break; 08/26/18: Pt fatigued after 30' today 9/14: 100 ft with CGA, Patient ambulated 100 feet with supervision     Time  8    Period  Weeks    Status  Achieved    Target Date  07/06/19      PT LONG TERM GOAL #7   Title  Patient will perform 10 MWT in <30 seconds for improved gait speed, gait mechanics, and functional capacity for mobility.     Baseline  06/02/19: 62.5 seconds with RW 11/4: 56 seconds with AFO and RW 12/30: 53 seconds with AFO and RW     Time  8    Period  Weeks    Status  On-going    Target Date  07/28/19             Plan - 07/14/19 1412    Clinical Impression Statement  Patient presents to physical therapy with excellent motivation, he tolerated prolonged ambulation 120 ft with RW without rest break, frequently able to increase velocity to match rhythm of music playing. Frequent cues are required in standing for upright posture due to forward trunk lean limiting body mechanics. Patient will continue to benefit from skilled therapy in order to improve dynamic standing balance and increase endurance    Rehab Potential  Fair    Clinical Impairments Affecting Rehab Potential  hx of HTN, HLD, CVA, learning disability, Diabetes, brain tumor,     PT Frequency  2x / week    PT Duration  8 weeks    PT Treatment/Interventions  ADLs/Self Care Home Management;Aquatic Therapy;Ultrasound;Moist Heat;Traction;DME Instruction;Gait training;Stair training;Functional mobility training;Therapeutic activities;Therapeutic exercise;Orthotic Fit/Training;Neuromuscular re-education;Balance training;Patient/family education;Manual techniques;Wheelchair mobility training;Passive range of motion;Energy conservation;Taping;Visual/perceptual remediation/compensation    PT Next Visit Plan  progress standing balance    PT Home Exercise Plan  TrA contraction, glute sets: needs updates. Potentially some time in supine flat in hospital bed, to improve hip extension deficits.     Consulted and Agree with Plan of Care  Patient  Patient will benefit from skilled therapeutic intervention in order to improve the following deficits and impairments:  Abnormal gait, Decreased activity tolerance, Decreased balance, Decreased knowledge of precautions, Decreased endurance, Decreased coordination, Decreased knowledge of use of DME, Decreased mobility, Decreased range of motion, Difficulty walking, Decreased safety awareness, Decreased strength, Impaired flexibility, Impaired perceived functional ability, Impaired tone, Postural dysfunction,  Improper body mechanics, Pain  Visit Diagnosis: Muscle weakness (generalized)  Other lack of coordination  Hemiplegia and hemiparesis following cerebral infarction affecting right dominant side (HCC)  Left-sided weakness     Problem List Patient Active Problem List   Diagnosis Date Noted  . Acute CVA (cerebrovascular accident) ([place]) 05/02/2016  . Left-sided weakness 05/01/2016   Janna Arch, PT, DPT   07/14/2019, 2:29 PM  [place] MAIN [facility] SERVICES [address] [place], [place], [address] Phone: [phone]   Fax:  [phone]  Name: Curtis Powell MRN: [id number] Date of Birth: January 05, 1966

## 2019-07-14 NOTE — Therapy (Signed)
[place] MAIN [facility] SERVICES [address] [place], [place], [address] Phone: [phone]   Fax:  [phone]  Occupational Therapy Treatment  Curtis Powell Details  Name: Curtis Powell MRN: [id number] Date of Birth: 12-29-1965 No data recorded  Encounter Date: 07/14/2019  OT End of Session - 07/14/19 1443    Visit Number  [id number]    Number of Visits  209    Date for OT Re-Evaluation  07/28/19    Authorization Type  Progress reporting period starting 05/03/2019    OT Start Time  1435    OT Stop Time  1515    OT Time Calculation (min)  40 min    Equipment Utilized During Treatment  built up pen and adaptive scissors    Activity Tolerance  Curtis Powell tolerated treatment well    Behavior During Therapy  WFL for tasks assessed/performed       Past Medical History:  Diagnosis Date  . GERD (gastroesophageal reflux disease)   . Hyperlipidemia   . Hypertension   . Obesity   . Stroke [facility])     Past Surgical History:  Procedure Laterality Date  . BRAIN SURGERY      There were no vitals filed for this visit.  Subjective Assessment - 07/14/19 1442    Subjective   Curtis Powell denies any pain.  Reports he is doing well, had a good Easter.    Curtis Powell is accompanied by:  Family member    Pertinent History  Pt. is a 54 y.o. male who suffered a CVA on 05/01/2016. Pt. was admitted to the hospital. Once discharged, he received Home Health PT and OT services for about a month. Pt. has had multiple CVAs over the past 8 years, and has had multiple falls in the past 6 months. Pt. resides in an apartment. Pt. has caregivers for 80 hours. Pt.'s mother stays with pt. at night, and assists with IADL tasks.      Currently in Pain?  No/denies      OT TREATMENT  Neuromuscular re-ed:  Pt. worked on grasping, and positioning magnetic hooks on a whiteboard positioned at a vertical angle on a height 11" from the tabletop surface. Pt. worked on [employer] [facility] skills grasping1",  3/4", and1/2" flat washers, and placing them on the hooks.Pt. Dropped multiple washers when attempting to store the washers in the palm of his hand when reaching up to place the washers on the hooks. Pt. Was able to store 4 washers at a time. Pt. worked on sorting, and stacking 1", 3/4", and 1/2" washers.   Response to Treatment:  Pt. reports that he is planning to get the second dose of the vaccine next week. Pt. required less cues to avoid compensating proximally with hiking the right shoulder, and leaning to the left, or right when reaching.  Pt. was able to maintain his trunk in midline for the duration of the task. Pt. continues to require assist manipulating small objects, storing them in his hand, and performing translatory movements. Pt. fatigues with his RUE elevated, and requires rest breaks. Pt. continues to work on improving RUE strength, and [facility] skills in order to work towards improving RUE functioning during ADLs, and IADL tasks.                      OT Education - 07/14/19 1443    Education provided  Yes    Education Details  HEP, [place], reaching    Person(s) Educated  Curtis Powell    Methods  Explanation;Demonstration    Comprehension  Verbalized understanding;Returned demonstration;Verbal cues required       OT Short Term Goals - 03/03/17 1459      OT SHORT TERM GOAL #1   Title  `        OT Long Term Goals - 06/17/19 1442      OT LONG TERM GOAL #4   Title  Pt. will perform self dressing with minA and A/E as needed.    Baseline  06/17/2019: Pt. continues to require assist with donning underwear over feet. Min-ModA donning his jacket. (Depending on the jacket)    Time  12    Period  Weeks    Status  Partially Met    Target Date  07/28/19      OT LONG TERM GOAL #6   Title  Pt. will write his name efficiently with 100% legibility    Baseline  06/17/2019: Pt continues to present with limited with 50% legibility and efficiency when writing name to sign  in when riding the [place]    Time  12    Period  Weeks    Status  On-going    Target Date  07/28/19      OT LONG TERM GOAL #7   Title  Pt will independently and consistently follow HEP to increase UE strength to increase functional independence    Baseline  Pt. continues to work towards independence with exercises    Time  12    Period  Weeks    Status  On-going    Target Date  07/28/19      OT LONG TERM GOAL #8   Title  Pt. will require supervision ironing a shirt.    Baseline  Pt. has progressed CGA, and complete set-up seated using a tabletop iron    Time  12    Period  Weeks    Status  On-going    Target Date  07/28/19      OT LONG TERM GOAL  #9   Baseline  Pt. will independently be able to sew a button onto a shirt.    Time  12    Period  Weeks    Status  On-going    Target Date  07/28/19      OT LONG TERM GOAL  #10   TITLE  Pt. will independently be able to throw a ball    Baseline  Pt. continues to be limited with throwing overhand, however pt. is able to perform underhand throwing.    Time  12    Period  Weeks    Status  On-going    Target Date  07/28/19      OT LONG TERM GOAL  #11   TITLE  Pt. will improve UE functional reaching to be able to independently use his RUE to hand his clothes in the closest.    Baseline  Pt. continues to be able to reach for clothing pulling them off of the hangers. Pt. is unable to reach up to place hangers onto the the rack.    Time  12    Period  Weeks    Status  On-going    Target Date  07/28/19      OT LONG TERM GOAL  #12   TITLE  Pt. will  be independent with shaving using an electric razer.    Baseline  Pt. is able to use the electric razer independently, however is unable to  access the position of the plug and requires assist form caregivers secondary to the size of the bathroom, and location of the plug.    Time  12    Period  Weeks    Status  Achieved    Target Date  07/28/19            Plan - 07/14/19 1444     Clinical Impression Statement Pt. reports that he is planning to get the second dose of the vaccine next week. Pt. required less cues to avoid compensating proximally with hiking the right shoulder, and leaning to the left, or right when reaching.  Pt. was able to maintain his trunk in midline for the duration of the task. Pt. continues to require assist manipulating small objects, storing them in his hand, and performing translatory movements. Pt. fatigues with his RUE elevated, and requires rest breaks. Pt. continues to work on improving RUE strength, and [facility] skills in order to work towards improving RUE functioning during ADLs, and IADL tasks.   OT Occupational Profile and History  Problem Focused Assessment - Including review of records relating to presenting problem    Occupational performance deficits (Please refer to evaluation for details):  ADL's;IADL's    Body Structure / Function / Physical Skills  ADL;FMC;ROM;IADL;Pain;Coordination    Rehab Potential  Fair    Clinical Decision Making  Several treatment options, min-mod task modification necessary    Comorbidities Affecting Occupational Performance:  May have comorbidities impacting occupational performance    Modification or Assistance to Complete Evaluation   Min-Moderate modification of tasks or assist with assess necessary to complete eval    OT Frequency  1x / week    OT Duration  12 weeks    OT Treatment/Interventions  Self-care/ADL training;Curtis Powell/family education;DME and/or AE instruction;Therapeutic exercise;Neuromuscular education    Consulted and Agree with Plan of Care  Curtis Powell       Curtis Powell will benefit from skilled therapeutic intervention in order to improve the following deficits and impairments:   Body Structure / Function / Physical Skills: ADL, FMC, ROM, IADL, Pain, Coordination       Visit Diagnosis: Muscle weakness (generalized)  Other lack of coordination    Problem List Curtis Powell Active Problem List    Diagnosis Date Noted  . Acute CVA (cerebrovascular accident) ([place]) 05/02/2016  . Left-sided weakness 05/01/2016    Harrel Carina, MS, OTR/L 07/14/2019, 2:48 PM  [place] MAIN [facility] SERVICES [address] [place], [place], [address] Phone: [phone]   Fax:  [phone]  Name: SAYGE SALVATO MRN: [id number] Date of Birth: Mar 31, 1966

## 2019-07-19 ENCOUNTER — Ambulatory Visit: Payer: Medicare HMO

## 2019-07-19 ENCOUNTER — Ambulatory Visit: Payer: Medicare HMO | Admitting: Occupational Therapy

## 2019-07-19 ENCOUNTER — Encounter: Payer: Self-pay | Admitting: Occupational Therapy

## 2019-07-19 ENCOUNTER — Other Ambulatory Visit: Payer: Self-pay

## 2019-07-19 DIAGNOSIS — R278 Other lack of coordination: Secondary | ICD-10-CM

## 2019-07-19 DIAGNOSIS — M6281 Muscle weakness (generalized): Secondary | ICD-10-CM | POA: Diagnosis not present

## 2019-07-19 DIAGNOSIS — I69351 Hemiplegia and hemiparesis following cerebral infarction affecting right dominant side: Secondary | ICD-10-CM

## 2019-07-19 DIAGNOSIS — R2689 Other abnormalities of gait and mobility: Secondary | ICD-10-CM

## 2019-07-19 NOTE — Therapy (Signed)
[place] MAIN [facility] SERVICES [address] [place], [place], [address] Phone: [phone]   Fax:  [phone]  Occupational Therapy Treatment  Patient Details  Name: Curtis Powell MRN: [id number] Date of Birth: 1965-11-10 No data recorded  Encounter Date: 07/19/2019  OT End of Session - 07/19/19 1448    Visit Number  [id number]    Number of Visits  209    Date for OT Re-Evaluation  07/28/19    Authorization Type  Progress reporting period starting 05/03/2019    OT Start Time  1435    OT Stop Time  1515    OT Time Calculation (min)  40 min    Activity Tolerance  Patient tolerated treatment well    Behavior During Therapy  [facility] for tasks assessed/performed       Past Medical History:  Diagnosis Date  . GERD (gastroesophageal reflux disease)   . Hyperlipidemia   . Hypertension   . Obesity   . Stroke [facility])     Past Surgical History:  Procedure Laterality Date  . BRAIN SURGERY      There were no vitals filed for this visit.  Subjective Assessment - 07/19/19 1447    Subjective   Patient denies any pain.  Reports he is doing well, had a good Easter.    Patient is accompanied by:  Family member    Pertinent History  Pt. is a 54 y.o. male who suffered a CVA on 05/01/2016. Pt. was admitted to the hospital. Once discharged, he received Home Health PT and OT services for about a month. Pt. has had multiple CVAs over the past 8 years, and has had multiple falls in the past 6 months. Pt. resides in an apartment. Pt. has caregivers for 80 hours. Pt.'s mother stays with pt. at night, and assists with IADL tasks.      Currently in Pain?  No/denies        OT TREATMENT  Neuromuscular re-ed:  Pt. worked on grasping the coins from the tabletop surface with his right hand, and reached up to place them into a slotted coin container. Pt. Worked with a 1# cuff weight in place. Pt. Worked on sorting coins.  There. Ex.   Pt. worked on reaching using the  [employer]. Pt. grasped the shapes with his right hand, and moved the shapes  through 3 vertical dowels of varying heights with 1# cuff weight at hie right wrist.  Response to Treatment:  Pt.reports that he is planning to get the second dose of the vaccine later this week. Pt. was able to tolerate the 1# cuff weight throughout the duration of the session.  Pt. required less cues to avoid compensating proximally with hiking the right shoulder, and leaning to the left, or right when reaching.  Pt. was able to maintain his trunk in midline for the duration of the task. Pt. continues to require assist manipulatingsmall objects, storing them in his hand, and performing translatory movements. Fewer rest breaks were required today.Pt. continues to work on improving RUE strength, and [facility] skills in order to work towards improving RUE functioning during ADLs, and IADL tasks.                           OT Education - 07/19/19 1448    Education provided  Yes    Education Details  HEP, [place], reaching    Person(s) Educated  Patient    Methods  Explanation;Demonstration    Comprehension  Verbalized understanding;Returned demonstration;Verbal cues required       OT Short Term Goals - 03/03/17 1459      OT SHORT TERM GOAL #1   Title  `        OT Long Term Goals - 06/17/19 1442      OT LONG TERM GOAL #4   Title  Pt. will perform self dressing with minA and A/E as needed.    Baseline  06/17/2019: Pt. continues to require assist with donning underwear over feet. Min-ModA donning his jacket. (Depending on the jacket)    Time  12    Period  Weeks    Status  Partially Met    Target Date  07/28/19      OT LONG TERM GOAL #6   Title  Pt. will write his name efficiently with 100% legibility    Baseline  06/17/2019: Pt continues to present with limited with 50% legibility and efficiency when writing name to sign in when riding the [place]    Time  12    Period  Weeks    Status   On-going    Target Date  07/28/19      OT LONG TERM GOAL #7   Title  Pt will independently and consistently follow HEP to increase UE strength to increase functional independence    Baseline  Pt. continues to work towards independence with exercises    Time  12    Period  Weeks    Status  On-going    Target Date  07/28/19      OT LONG TERM GOAL #8   Title  Pt. will require supervision ironing a shirt.    Baseline  Pt. has progressed CGA, and complete set-up seated using a tabletop iron    Time  12    Period  Weeks    Status  On-going    Target Date  07/28/19      OT LONG TERM GOAL  #9   Baseline  Pt. will independently be able to sew a button onto a shirt.    Time  12    Period  Weeks    Status  On-going    Target Date  07/28/19      OT LONG TERM GOAL  #10   TITLE  Pt. will independently be able to throw a ball    Baseline  Pt. continues to be limited with throwing overhand, however pt. is able to perform underhand throwing.    Time  12    Period  Weeks    Status  On-going    Target Date  07/28/19      OT LONG TERM GOAL  #11   TITLE  Pt. will improve UE functional reaching to be able to independently use his RUE to hand his clothes in the closest.    Baseline  Pt. continues to be able to reach for clothing pulling them off of the hangers. Pt. is unable to reach up to place hangers onto the the rack.    Time  12    Period  Weeks    Status  On-going    Target Date  07/28/19      OT LONG TERM GOAL  #12   TITLE  Pt. will  be independent with shaving using an electric razer.    Baseline  Pt. is able to use the electric razer independently, however is unable to access the position of the plug  and requires assist form caregivers secondary to the size of the bathroom, and location of the plug.    Time  12    Period  Weeks    Status  Achieved    Target Date  07/28/19            Plan - 07/19/19 1449    Clinical Impression Statement Pt.reports that he is planning to  get the second dose of the vaccine later this week. Pt. was able to tolerate the 1# cuff weight throughout the duration of the session.  Pt. required less cues to avoid compensating proximally with hiking the right shoulder, and leaning to the left, or right when reaching.  Pt. was able to maintain his trunk in midline for the duration of the task. Pt. continues to require assist manipulatingsmall objects, storing them in his hand, and performing translatory movements. Fewer rest breaks were required today.Pt. continues to work on improving RUE strength, and [facility] skills in order to work towards improving RUE functioning during ADLs, and IADL tasks.   OT Occupational Profile and History  Problem Focused Assessment - Including review of records relating to presenting problem    Occupational performance deficits (Please refer to evaluation for details):  ADL's;IADL's    Body Structure / Function / Physical Skills  ADL;FMC;ROM;IADL;Pain;Coordination    Rehab Potential  Fair    Clinical Decision Making  Several treatment options, min-mod task modification necessary    Comorbidities Affecting Occupational Performance:  May have comorbidities impacting occupational performance    Modification or Assistance to Complete Evaluation   Min-Moderate modification of tasks or assist with assess necessary to complete eval    OT Frequency  1x / week    OT Duration  12 weeks    OT Treatment/Interventions  Self-care/ADL training;Patient/family education;DME and/or AE instruction;Therapeutic exercise;Neuromuscular education    Consulted and Agree with Plan of Care  Patient       Patient will benefit from skilled therapeutic intervention in order to improve the following deficits and impairments:   Body Structure / Function / Physical Skills: ADL, FMC, ROM, IADL, Pain, Coordination       Visit Diagnosis: Muscle weakness (generalized)  Other lack of coordination    Problem List Patient Active Problem List    Diagnosis Date Noted  . Acute CVA (cerebrovascular accident) ([place]) 05/02/2016  . Left-sided weakness 05/01/2016    Harrel Carina, MS, OTR/L 07/19/2019, 2:53 PM  [place] MAIN [facility] SERVICES [address] [place], [place], [address] Phone: [phone]   Fax:  [phone]  Name: DRAYLON MERCADEL MRN: [id number] Date of Birth: [birth year]

## 2019-07-19 NOTE — Therapy (Signed)
[place] MAIN [facility] SERVICES [address] [place], [place], [address] Phone: [phone]   Fax:  [phone]  Physical Therapy Treatment  Patient Details  Name: Curtis Powell MRN: [id number] Date of Birth: 11-Jul-1965 No data recorded  Encounter Date: 07/19/2019  PT End of Session - 07/19/19 1424    Visit Number  [id number]    Number of Visits  188    Date for PT Re-Evaluation  07/28/19    PT Start Time  6503    PT Stop Time  1429    PT Time Calculation (min)  44 min    Equipment Utilized During Treatment  Gait belt    Activity Tolerance  Patient tolerated treatment well;No increased pain    Behavior During Therapy  WFL for tasks assessed/performed       Past Medical History:  Diagnosis Date  . GERD (gastroesophageal reflux disease)   . Hyperlipidemia   . Hypertension   . Obesity   . Stroke [facility])     Past Surgical History:  Procedure Laterality Date  . BRAIN SURGERY      There were no vitals filed for this visit.  Subjective Assessment - 07/19/19 1408    Subjective  Patient reports no falls or LOB since last session. Has been compliant with HEP.    Pertinent History   Patient is a pleasant 54 year old male who presents to physical therapy for weakness and immobility secondary to CVA.  Had a stroke in Feb 2018. He was previously fully independent, but this stroke caused severe residual deficits, mainly on the right side as well as speech, and now he is unable to walk or perform most of his ADLs on his own. Entire right side is very weak. He still has a little difficulty with speech but his swallowing is improved to baseline. His mom had to move in with him and now is his main caregiver.     Limitations  Sitting;Lifting;Standing;Walking;House hold activities    How long can you sit comfortably?  5 minutes    How long can you stand comfortably?  5 minutes    How long can you walk comfortably?  10 ft    Patient Stated Goals  Walk without  walker, walk further with walker, get some strength in back     Currently in Pain?  No/denies          Treatment:     Patient ambulates with RW 200 ft with CGA, cues for shift of weight with progression of limb and upright posture.  and lateral sway with decreased knee flex and hip flex. Upon assistance of w/c follow tactile cueing to PSIS and ASIS for optimal rotation and progression.     5lb ankle weight: -standing hip flexion/marching with RW 10x each LE ; cues for upright posture  -standing hip extension with RW 10x each LE. ; very challenging with LLE due to R knee hyperextending with weight shift  Seated adduction ball squeezes 15x  Seated GTB LAQ 12x each LE Seated GTB ER/IR 12x each LE Seated heel/toe raises.   Seated cross body punches for cross body coordination, upright posture without back rest, and conditioning 60 seconds x 2 trials.    Standing without UE support, use of UE's to stabilize self ~60-80 seconds   Standing with no UE support, alternating UE raises 12x each LE, 2 near LOB    Sit to stand with decreasing reliance upon UE's. 2x, single UE support  able to stabilize self once in standing without UE support with Min A    Vitals monitored throughout physical therapy with occasional rest breaks to return to therapeutic range. Able to return quickly with seated rest break.    Pt educated throughout session about proper posture and technique with exercises. Improved exercise technique, movement at target joints, use of target muscles after min to mod verbal, visual, tactile cues.   Patient performed with instruction, verbal cues, tactile cues of therapist: goal: increase tissue extensibility, promote proper posture, improve mobility   Patient continues to progress with functional capacity for mobility, ambulating 200 ft without rest break. Static standing without UE support is improving with patient tolerating UE raises. Frequent cues are required in standing for  upright posture due to forward trunk lean limiting body mechanics. Patient will continue to benefit from skilled therapy in order to improve dynamic standing balance and increase endurance                     PT Education - 07/19/19 1423    Education provided  Yes    Education Details  exercise technique, body mechanics    Person(s) Educated  Patient    Methods  Explanation;Demonstration;Tactile cues;Verbal cues    Comprehension  Verbalized understanding;Returned demonstration;Verbal cues required;Tactile cues required       PT Short Term Goals - 04/07/19 1504      PT SHORT TERM GOAL #1   Title  Patient will be independent in home exercise program to improve strength/mobility for better functional independence with ADLs.    Baseline  hep compliant    Time  2    Period  Weeks    Status  Achieved      PT SHORT TERM GOAL #2   Title  Patient will require min cueing for STS transfer with CGA for increased independence with mobility.     Baseline  CGA    Time  2    Period  Weeks    Status  Achieved      PT SHORT TERM GOAL #3   Title  Patient will maintain upright posture for > 15 seconds to demonstrate strengthened postural control muscualture     Baseline  18 seconds     Time  2    Period  Weeks    Status  Achieved        PT Long Term Goals - 07/06/19 0001      PT LONG TERM GOAL #2   Title  Patient (< 45 years old) will complete five times sit to stand test in < 10 seconds indicating an increased LE strength and improved balance.    Baseline  06/02/19: 19.93 seconds one hand on RW, one hand on w/c 12/30: 16 secpmds BUE support 02/11/19: 36 seconds 5/19: 20.6s from [facility], one hand on walker and one on WC, 80% upright posture 7/1: 22 seconds one hand on walker one hand on w/c  9/14: 48 seconds with full upright posture    Time  8    Period  Weeks    Status  On-going    Target Date  07/28/19      PT LONG TERM GOAL #3   Title  Patient will increase Berg Balance  score by > 6 points (26/56) to demonstrate decreased fall risk during functional activities.    Baseline  06/02/19: 28/56 12/23: 25/56 9/12: 20/56 10/15: 21/56 11/7: 20/56 12/19: 22/56; 08/26/18: 11/56 7/1: 17/56  9/14: 23/56, 07/06/19=28/56  Time  8    Period  Weeks    Status  Partially Met    Target Date  07/28/19      PT LONG TERM GOAL #4   Title  Patient will increase BLE gross strength to 4+/5 as to improve functional strength for independent gait, increased standing tolerance and increased ADL ability.    Baseline   06/02/19: hip flexion 4-/5, extension/abd 3/5 12/30: hip flexion 4/5 hip extension and abduction 3/5 02/11/19: hip flexion 4/5 hip extension and abduction 2+/54-/5 RLE, 10/15: 4/5 RLE 11/7: 4/5 RLE 3/3: 4/5 7/1:  hip flexion 4/5 hip extension 2/5 hip abduction/adduciton 2/5     Time  8    Period  Weeks    Status  On-going    Target Date  07/28/19      PT LONG TERM GOAL #5   Title  Patient will increase lower extremity functional scale to >40/80 to demonstrate improved functional mobility and increased tolerance with ADLs.     Baseline  06/02/19: 31/80 12/30: 35/80 02/10/19: 34/56 14/80; 12/26: 21/80; 2/18: 28/80  4/10; 25/80 5/29: 27/80; 7/16: 29/80 8/22: 30/80  9/12: 26/80 (feeling more tired due to moving) 10/15: 37/80 11/7: 23/80 3/3: 32/80; 08/25/18: 28/80 9/15: 32/56    Time  8    Period  Weeks    Status  On-going    Target Date  07/28/19      PT LONG TERM GOAL #6   Title  Patient will ambulate 150 ft with least assistive device and Supervision with no breaks to allow for increased mobility within home.    Baseline  2/24: ambulated 120 ft in previous session 12/30: unable to perform due to elevated HR 11/4: 105 ft with AFO after 10 MWT 11/7: 146f 12/19: 115 ft  3/3: will assess next session, has done 200 ft with one rest break; 08/26/18: Pt fatigued after 30' today 9/14: 100 ft with CGA, Patient ambulated 100 feet with supervision     Time  8    Period  Weeks    Status   Achieved    Target Date  07/06/19      PT LONG TERM GOAL #7   Title  Patient will perform 10 MWT in <30 seconds for improved gait speed, gait mechanics, and functional capacity for mobility.     Baseline  06/02/19: 62.5 seconds with RW 11/4: 56 seconds with AFO and RW 12/30: 53 seconds with AFO and RW     Time  8    Period  Weeks    Status  On-going    Target Date  07/28/19            Plan - 07/19/19 1426    Clinical Impression Statement  Patient continues to progress with functional capacity for mobility, ambulating 200 ft without rest break. Static standing without UE support is improving with patient tolerating UE raises. Frequent cues are required in standing for upright posture due to forward trunk lean limiting body mechanics. Patient will continue to benefit from skilled therapy in order to improve dynamic standing balance and increase endurance    Rehab Potential  Fair    Clinical Impairments Affecting Rehab Potential  hx of HTN, HLD, CVA, learning disability, Diabetes, brain tumor,     PT Frequency  2x / week    PT Duration  8 weeks    PT Treatment/Interventions  ADLs/Self Care Home Management;Aquatic Therapy;Ultrasound;Moist Heat;Traction;DME Instruction;Gait training;Stair training;Functional mobility training;Therapeutic activities;Therapeutic exercise;Orthotic Fit/Training;Neuromuscular re-education;Balance training;Patient/family education;Manual  techniques;Wheelchair mobility training;Passive range of motion;Energy conservation;Taping;Visual/perceptual remediation/compensation    PT Next Visit Plan  progress standing balance    PT Home Exercise Plan  TrA contraction, glute sets: needs updates. Potentially some time in supine flat in hospital bed, to improve hip extension deficits.     Consulted and Agree with Plan of Care  Patient       Patient will benefit from skilled therapeutic intervention in order to improve the following deficits and impairments:  Abnormal gait,  Decreased activity tolerance, Decreased balance, Decreased knowledge of precautions, Decreased endurance, Decreased coordination, Decreased knowledge of use of DME, Decreased mobility, Decreased range of motion, Difficulty walking, Decreased safety awareness, Decreased strength, Impaired flexibility, Impaired perceived functional ability, Impaired tone, Postural dysfunction, Improper body mechanics, Pain  Visit Diagnosis: Muscle weakness (generalized)  Other lack of coordination  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 Acute CVA (cerebrovascular accident) ([place]) 05/02/2016  . Left-sided weakness 05/01/2016   Janna Arch, PT, DPT   07/19/2019, 2:29 PM  [place] MAIN [employer] [facility] SERVICES [address] [place], [place], [address] Phone: [phone]   Fax:  [phone]  Name: DASHTON CZERWINSKI MRN: [id number] Date of Birth: 01-18-1966

## 2019-07-21 ENCOUNTER — Encounter: Payer: Self-pay | Admitting: Occupational Therapy

## 2019-07-21 ENCOUNTER — Other Ambulatory Visit: Payer: Self-pay

## 2019-07-21 ENCOUNTER — Ambulatory Visit: Payer: Medicare HMO

## 2019-07-21 ENCOUNTER — Ambulatory Visit: Payer: Medicare HMO | Admitting: Occupational Therapy

## 2019-07-21 DIAGNOSIS — I69351 Hemiplegia and hemiparesis following cerebral infarction affecting right dominant side: Secondary | ICD-10-CM

## 2019-07-21 DIAGNOSIS — M6281 Muscle weakness (generalized): Secondary | ICD-10-CM

## 2019-07-21 DIAGNOSIS — R2689 Other abnormalities of gait and mobility: Secondary | ICD-10-CM

## 2019-07-21 DIAGNOSIS — R278 Other lack of coordination: Secondary | ICD-10-CM

## 2019-07-21 NOTE — Therapy (Signed)
[place] MAIN [facility] SERVICES [address] [place], [place], [address] Phone: [phone]   Fax:  [phone]  Occupational Therapy Treatment  Patient Details  Name: Curtis Powell MRN: [id number] Date of Birth: 13-Apr-1965 No data recorded  Encounter Date: 07/21/2019  OT End of Session - 07/21/19 1435    Visit Number  [id number]    Number of Visits  209    Date for OT Re-Evaluation  07/28/19    Authorization Type  Progress reporting period starting 05/03/2019    OT Start Time  1432    OT Stop Time  1515    OT Time Calculation (min)  43 min    Activity Tolerance  Patient tolerated treatment well    Behavior During Therapy  [facility] for tasks assessed/performed       Past Medical History:  Diagnosis Date  . GERD (gastroesophageal reflux disease)   . Hyperlipidemia   . Hypertension   . Obesity   . Stroke [facility])     Past Surgical History:  Procedure Laterality Date  . BRAIN SURGERY      There were no vitals filed for this visit.  Subjective Assessment - 07/21/19 1434    Subjective   Pt. reports no pain today.    Patient is accompanied by:  Family member    Pertinent History  Pt. is a 54 y.o. male who suffered a CVA on 05/01/2016. Pt. was admitted to the hospital. Once discharged, he received Home Health PT and OT services for about a month. Pt. has had multiple CVAs over the past 8 years, and has had multiple falls in the past 6 months. Pt. resides in an apartment. Pt. has caregivers for 80 hours. Pt.'s mother stays with pt. at night, and assists with IADL tasks.      Patient Stated Goals  To be able to throw a ball and dribble a ball, do as much as I can for myself.     Currently in Pain?  No/denies       OT TREATMENT  Neuromuscular re-ed:  Pt. worked on grasping the coins from the tabletop surface with his right hand, and reached up to place them into a slotted coin container. Pt. Worked with a 1 & 1/2# cuff weight in place. Pt. Worked on  sorting coins.  There. Ex.   Pt. worked on reaching using the [employer]. Pt. grasped the shapes with his right hand, and moved the shapes  through 3 vertical dowels of varying heights with 1 & 1/2# cuff weight at his right wrist.  Response to Treatment:  Pt.reports that heis planning to get the second dose of the vaccine tomorrow. Pt. was able to tolerate increased weight to 1 & 1/2# cuff weight throughout the duration of the session. Pt. required less cues to avoid compensating proximally with hiking the right shoulder, and leaning to the left, or right when reaching.Pt. was able to maintain his trunk in midline for the duration of the task, with no compensation proximally.Pt. continues to require assist manipulatingsmall objects, storing them in his hand, and performing translatory movements. Fewer rest breaks were required today.Pt. continues to work on improving RUE strength, and [facility] skills in order to work towards improving RUE functioning during ADLs, and IADL tasks.                         OT Education - 07/21/19 1435    Education provided  Yes  Person(s) Educated  Patient    Methods  Explanation;Demonstration    Comprehension  Verbalized understanding;Returned demonstration;Verbal cues required       OT Short Term Goals - 03/03/17 1459      OT SHORT TERM GOAL #1   Title  `        OT Long Term Goals - 06/17/19 1442      OT LONG TERM GOAL #4   Title  Pt. will perform self dressing with minA and A/E as needed.    Baseline  06/17/2019: Pt. continues to require assist with donning underwear over feet. Min-ModA donning his jacket. (Depending on the jacket)    Time  12    Period  Weeks    Status  Partially Met    Target Date  07/28/19      OT LONG TERM GOAL #6   Title  Pt. will write his name efficiently with 100% legibility    Baseline  06/17/2019: Pt continues to present with limited with 50% legibility and efficiency when writing name  to sign in when riding the [place]    Time  12    Period  Weeks    Status  On-going    Target Date  07/28/19      OT LONG TERM GOAL #7   Title  Pt will independently and consistently follow HEP to increase UE strength to increase functional independence    Baseline  Pt. continues to work towards independence with exercises    Time  12    Period  Weeks    Status  On-going    Target Date  07/28/19      OT LONG TERM GOAL #8   Title  Pt. will require supervision ironing a shirt.    Baseline  Pt. has progressed CGA, and complete set-up seated using a tabletop iron    Time  12    Period  Weeks    Status  On-going    Target Date  07/28/19      OT LONG TERM GOAL  #9   Baseline  Pt. will independently be able to sew a button onto a shirt.    Time  12    Period  Weeks    Status  On-going    Target Date  07/28/19      OT LONG TERM GOAL  #10   TITLE  Pt. will independently be able to throw a ball    Baseline  Pt. continues to be limited with throwing overhand, however pt. is able to perform underhand throwing.    Time  12    Period  Weeks    Status  On-going    Target Date  07/28/19      OT LONG TERM GOAL  #11   TITLE  Pt. will improve UE functional reaching to be able to independently use his RUE to hand his clothes in the closest.    Baseline  Pt. continues to be able to reach for clothing pulling them off of the hangers. Pt. is unable to reach up to place hangers onto the the rack.    Time  12    Period  Weeks    Status  On-going    Target Date  07/28/19      OT LONG TERM GOAL  #12   TITLE  Pt. will  be independent with shaving using an electric razer.    Baseline  Pt. is able to use the electric razer independently, however  is unable to access the position of the plug and requires assist form caregivers secondary to the size of the bathroom, and location of the plug.    Time  12    Period  Weeks    Status  Achieved    Target Date  07/28/19            Plan - 07/21/19  1435    Clinical Impression Statement Pt.reports that heis planning to get the second dose of the vaccine tomorrow. Pt. was able to tolerate increased weight to 1 & 1/2# cuff weight throughout the duration of the session. Pt. required less cues to avoid compensating proximally with hiking the right shoulder, and leaning to the left, or right when reaching.Pt. was able to maintain his trunk in midline for the duration of the task, with no compensation proximally.Pt. continues to require assist manipulatingsmall objects, storing them in his hand, and performing translatory movements. Fewer rest breaks were required today.Pt. continues to work on improving RUE strength, and [facility] skills in order to work towards improving RUE functioning during ADLs, and IADL tasks.     OT Occupational Profile and History  Problem Focused Assessment - Including review of records relating to presenting problem    Occupational performance deficits (Please refer to evaluation for details):  ADL's;IADL's    Body Structure / Function / Physical Skills  ADL;FMC;ROM;IADL;Pain;Coordination    Rehab Potential  Fair    Clinical Decision Making  Several treatment options, min-mod task modification necessary    Comorbidities Affecting Occupational Performance:  May have comorbidities impacting occupational performance    Modification or Assistance to Complete Evaluation   Min-Moderate modification of tasks or assist with assess necessary to complete eval    OT Frequency  1x / week    OT Duration  12 weeks    OT Treatment/Interventions  Self-care/ADL training;Patient/family education;DME and/or AE instruction;Therapeutic exercise;Neuromuscular education    Consulted and Agree with Plan of Care  Patient       Patient will benefit from skilled therapeutic intervention in order to improve the following deficits and impairments:   Body Structure / Function / Physical Skills: ADL, FMC, ROM, IADL, Pain, Coordination        Visit Diagnosis: Muscle weakness (generalized)  Other lack of coordination    Problem List Patient Active Problem List   Diagnosis Date Noted  . Acute CVA (cerebrovascular accident) ([place]) 05/02/2016  . Left-sided weakness 05/01/2016    Harrel Carina, MS, OTR/L 07/21/2019, 2:41 PM  [place] MAIN [facility] SERVICES [address] [place], [place], [address] Phone: [phone]   Fax:  [phone]  Name: Curtis Powell MRN: [id number] Date of Birth: 1966-04-08

## 2019-07-21 NOTE — Therapy (Signed)
[place] MA[facility] SERVICES [address] [place], [place], [address] Phone: [phone]   Fax:  [phone]  Physical Therapy Treatment  Patient Details  Name: Curtis Powell MRN: [id number] Date of Birth: Jun 29, 1965 No data recorded  Encounter Date: 07/21/2019  PT End of Session - 07/21/19 1406    Visit Number  [id number]    Number of Visits  188    Date for PT Re-Evaluation  07/28/19    PT Start Time  7048    PT Stop Time  1429    PT Time Calculation (min)  44 min    Equipment Utilized During Treatment  Gait belt    Activity Tolerance  Patient tolerated treatment well;No increased pain    Behavior During Therapy  WFL for tasks assessed/performed       Past Medical History:  Diagnosis Date  . GERD (gastroesophageal reflux disease)   . Hyperlipidemia   . Hypertension   . Obesity   . Stroke [facility])     Past Surgical History:  Procedure Laterality Date  . BRAIN SURGERY      There were no vitals filed for this visit.  Subjective Assessment - 07/21/19 1353    Subjective  Patient reports he has been doing his exercises. Has been able to do some walking in the house. No falls or LOB since last session, no pain.    Pertinent History   Patient is a pleasant 54 year old male who presents to physical therapy for weakness and immobility secondary to CVA.  Had a stroke in Feb 2018. He was previously fully independent, but this stroke caused severe residual deficits, mainly on the right side as well as speech, and now he is unable to walk or perform most of his ADLs on his own. Entire right side is very weak. He still has a little difficulty with speech but his swallowing is improved to baseline. His mom had to move in with him and now is his main caregiver.     Limitations  Sitting;Lifting;Standing;Walking;House hold activities    How long can you sit comfortably?  5 minutes    How long can you stand comfortably?  5 minutes    How long can you walk  comfortably?  10 ft    Patient Stated Goals  Walk without walker, walk further with walker, get some strength in back     Currently in Pain?  No/denies           Treatment:    Nustep lvl 3  RPM> 70 for cardiovascular challenge/strengthening.   In // bars:  Side step : lateral steps 4x length of // bars, close CGA ; rest afterwards due to HR >120  Forward length of // bars, backwards length of // bars, close CGA, cues for upright posture and hip extension. x4  2" step toe taps in // bars. Cues for upright posture and hip flexion with spatial awareness and coordination. 10x each LE    5lb ankle weight: -standing hip flexion/marching with RW 10x each LE ; cues for upright posture  -seated LAQ 3 second holds, 15x each LE   -seated ER/IR 15x each LE  Seated GTB abduction 20x   Standing without UE support, use of UE's to stabilize self ~60-80 seconds       Vitals monitored throughout physical therapy with occasional rest breaks to return to therapeutic range. Able to return quickly with seated rest break.    Pt educated throughout  session about proper posture and technique with exercises. Improved exercise technique, movement at target joints, use of target muscles after min to mod verbal, visual, tactile cues.   Patient performed with instruction, verbal cues, tactile cues of therapist: goal: increase tissue extensibility, promote proper posture, improve mobility                     PT Education - 07/21/19 1354    Education provided  Yes    Education Details  exercise technique, bodymechanics    Person(s) Educated  Patient    Methods  Explanation;Demonstration;Tactile cues;Verbal cues    Comprehension  Verbalized understanding;Returned demonstration;Verbal cues required;Tactile cues required       PT Short Term Goals - 04/07/19 1504      PT SHORT TERM GOAL #1   Title  Patient will be independent in home exercise program to improve strength/mobility for  better functional independence with ADLs.    Baseline  hep compliant    Time  2    Period  Weeks    Status  Achieved      PT SHORT TERM GOAL #2   Title  Patient will require min cueing for STS transfer with CGA for increased independence with mobility.     Baseline  CGA    Time  2    Period  Weeks    Status  Achieved      PT SHORT TERM GOAL #3   Title  Patient will maintain upright posture for > 15 seconds to demonstrate strengthened postural control muscualture     Baseline  18 seconds     Time  2    Period  Weeks    Status  Achieved        PT Long Term Goals - 07/06/19 0001      PT LONG TERM GOAL #2   Title  Patient (< 14 years old) will complete five times sit to stand test in < 10 seconds indicating an increased LE strength and improved balance.    Baseline  06/02/19: 19.93 seconds one hand on RW, one hand on w/c 12/30: 16 secpmds BUE support 02/11/19: 36 seconds 5/19: 20.6s from [facility], one hand on walker and one on WC, 80% upright posture 7/1: 22 seconds one hand on walker one hand on w/c  9/14: 48 seconds with full upright posture    Time  8    Period  Weeks    Status  On-going    Target Date  07/28/19      PT LONG TERM GOAL #3   Title  Patient will increase Berg Balance score by > 6 points (26/56) to demonstrate decreased fall risk during functional activities.    Baseline  06/02/19: 28/56 12/23: 25/56 9/12: 20/56 10/15: 21/56 11/7: 20/56 12/19: 22/56; 08/26/18: 11/56 7/1: 17/56  9/14: 23/56, 07/06/19=28/56    Time  8    Period  Weeks    Status  Partially Met    Target Date  07/28/19      PT LONG TERM GOAL #4   Title  Patient will increase BLE gross strength to 4+/5 as to improve functional strength for independent gait, increased standing tolerance and increased ADL ability.    Baseline   06/02/19: hip flexion 4-/5, extension/abd 3/5 12/30: hip flexion 4/5 hip extension and abduction 3/5 02/11/19: hip flexion 4/5 hip extension and abduction 2+/54-/5 RLE, 10/15: 4/5 RLE 11/7:  4/5 RLE 3/3: 4/5 7/1:  hip flexion 4/5 hip extension  2/5 hip abduction/adduciton 2/5     Time  8    Period  Weeks    Status  On-going    Target Date  07/28/19      PT LONG TERM GOAL #5   Title  Patient will increase lower extremity functional scale to >40/80 to demonstrate improved functional mobility and increased tolerance with ADLs.     Baseline  06/02/19: 31/80 12/30: 35/80 02/10/19: 34/56 14/80; 12/26: 21/80; 2/18: 28/80  4/10; 25/80 5/29: 27/80; 7/16: 29/80 8/22: 30/80  9/12: 26/80 (feeling more tired due to moving) 10/15: 37/80 11/7: 23/80 3/3: 32/80; 08/25/18: 28/80 9/15: 32/56    Time  8    Period  Weeks    Status  On-going    Target Date  07/28/19      PT LONG TERM GOAL #6   Title  Patient will ambulate 150 ft with least assistive device and Supervision with no breaks to allow for increased mobility within home.    Baseline  2/24: ambulated 120 ft in previous session 12/30: unable to perform due to elevated HR 11/4: 105 ft with AFO after 10 MWT 11/7: 136f 12/19: 115 ft  3/3: will assess next session, has done 200 ft with one rest break; 08/26/18: Pt fatigued after 30' today 9/14: 100 ft with CGA, Patient ambulated 100 feet with supervision     Time  8    Period  Weeks    Status  Achieved    Target Date  07/06/19      PT LONG TERM GOAL #7   Title  Patient will perform 10 MWT in <30 seconds for improved gait speed, gait mechanics, and functional capacity for mobility.     Baseline  06/02/19: 62.5 seconds with RW 11/4: 56 seconds with AFO and RW 12/30: 53 seconds with AFO and RW     Time  8    Period  Weeks    Status  On-going    Target Date  07/28/19            Plan - 07/21/19 1423    Clinical Impression Statement  Patient is challenged with prolonged activation of posterior chain musculature potentially due to excessive forward trunk lean. When fatigued R knee results in increased tremors and reliance upon UE's. Backwards ambulation is an area for continued focus for  postural and muscle recruitment. Patient will continue to benefit from skilled therapy in order to improve dynamic standing balance and increase endurance    Rehab Potential  Fair    Clinical Impairments Affecting Rehab Potential  hx of HTN, HLD, CVA, learning disability, Diabetes, brain tumor,     PT Frequency  2x / week    PT Duration  8 weeks    PT Treatment/Interventions  ADLs/Self Care Home Management;Aquatic Therapy;Ultrasound;Moist Heat;Traction;DME Instruction;Gait training;Stair training;Functional mobility training;Therapeutic activities;Therapeutic exercise;Orthotic Fit/Training;Neuromuscular re-education;Balance training;Patient/family education;Manual techniques;Wheelchair mobility training;Passive range of motion;Energy conservation;Taping;Visual/perceptual remediation/compensation    PT Next Visit Plan  progress standing balance    PT Home Exercise Plan  TrA contraction, glute sets: needs updates. Potentially some time in supine flat in hospital bed, to improve hip extension deficits.     Consulted and Agree with Plan of Care  Patient       Patient will benefit from skilled therapeutic intervention in order to improve the following deficits and impairments:  Abnormal gait, Decreased activity tolerance, Decreased balance, Decreased knowledge of precautions, Decreased endurance, Decreased coordination, Decreased knowledge of use of DME, Decreased mobility, Decreased range  of motion, Difficulty walking, Decreased safety awareness, Decreased strength, Impaired flexibility, Impaired perceived functional ability, Impaired tone, Postural dysfunction, Improper body mechanics, Pain  Visit Diagnosis: Muscle weakness (generalized)  Other lack of coordination  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 Acute CVA  (cerebrovascular accident) ([place]) 05/02/2016  . Left-sided weakness 05/01/2016   Janna Arch, PT, DPT   07/21/2019, 2:29 PM  [place] MAIN [facility] SERVICES [address] [place], [place], [address] Phone: [phone]   Fax:  [phone]  Name: Curtis Powell MRN: [id number] Date of Birth: 10/27/1965

## 2019-07-23 ENCOUNTER — Other Ambulatory Visit: Payer: Self-pay | Admitting: Neurology

## 2019-07-23 DIAGNOSIS — I6389 Other cerebral infarction: Secondary | ICD-10-CM

## 2019-07-26 ENCOUNTER — Ambulatory Visit: Payer: Medicare HMO

## 2019-07-26 ENCOUNTER — Encounter: Payer: Medicare HMO | Admitting: Occupational Therapy

## 2019-07-28 ENCOUNTER — Ambulatory Visit: Payer: Medicare HMO | Admitting: Occupational Therapy

## 2019-07-28 ENCOUNTER — Ambulatory Visit: Payer: Medicare HMO | Attending: Internal Medicine

## 2019-07-28 ENCOUNTER — Ambulatory Visit: Payer: Medicare HMO

## 2019-07-28 ENCOUNTER — Encounter: Payer: Self-pay | Admitting: Occupational Therapy

## 2019-07-28 ENCOUNTER — Other Ambulatory Visit: Payer: Self-pay

## 2019-07-28 DIAGNOSIS — R2689 Other abnormalities of gait and mobility: Secondary | ICD-10-CM

## 2019-07-28 DIAGNOSIS — Z23 Encounter for immunization: Secondary | ICD-10-CM

## 2019-07-28 DIAGNOSIS — M6281 Muscle weakness (generalized): Secondary | ICD-10-CM

## 2019-07-28 DIAGNOSIS — I69351 Hemiplegia and hemiparesis following cerebral infarction affecting right dominant side: Secondary | ICD-10-CM

## 2019-07-28 DIAGNOSIS — R278 Other lack of coordination: Secondary | ICD-10-CM

## 2019-07-28 NOTE — Therapy (Signed)
[place] MAIN [facility] SERVICES [address] [place], [place], [address] Phone: [phone]   Fax:  [phone]  Occupational Therapy Treatment/Recertification Note Occupational Therapy Progress Note  Dates of reporting period  05/03/2019 to   07/28/2019  Patient Details  Name: Curtis Powell MRN: [id number] Date of Birth: 1965/10/04 No data recorded  Encounter Date: 07/28/2019  OT End of Session - 07/28/19 1439    Visit Number  180    Number of Visits  209    Date for OT Re-Evaluation  10/20/19    Authorization Type  Progress reporting period starting 05/03/2019    OT Start Time  1433    OT Stop Time  1515    OT Time Calculation (min)  42 min    Equipment Utilized During Treatment  built up pen and adaptive scissors    Activity Tolerance  Patient tolerated treatment well    Behavior During Therapy  WFL for tasks assessed/performed       Past Medical History:  Diagnosis Date  . GERD (gastroesophageal reflux disease)   . Hyperlipidemia   . Hypertension   . Obesity   . Stroke [facility])     Past Surgical History:  Procedure Laterality Date  . BRAIN SURGERY      There were no vitals filed for this visit.  Subjective Assessment - 07/28/19 1435    Subjective   Pt. reports no pain today.    Patient is accompanied by:  Family member    Pertinent History  Pt. is a 54 y.o. male who suffered a CVA on 05/01/2016. Pt. was admitted to the hospital. Once discharged, he received Home Health PT and OT services for about a month. Pt. has had multiple CVAs over the past 8 years, and has had multiple falls in the past 6 months. Pt. resides in an apartment. Pt. has caregivers for 80 hours. Pt.'s mother stays with pt. at night, and assists with IADL tasks.      Patient Stated Goals  To be able to throw a ball and dribble a ball, do as much as I can for myself.     Currently in Pain?  No/denies         [facility] OT Assessment - 07/28/19 0001       Coordination   Right 9 Hole Peg Test  47 sec.    Left 9 Hole Peg Test  58 sec.      AROM   Overall AROM Comments  Right shoulder flexion 155 degrees., Abduction 92 degrees.      Hand Function   Right Hand Grip (lbs)  48    Right Hand Lateral Pinch  20 lbs    Right Hand 3 Point Pinch  14 lbs    Left Hand Grip (lbs)  25#    Left Hand Lateral Pinch  15 lbs    Left 3 point pinch  9 lbs      Measurements were obtained, and goals were reviewed with the pt.   Pt. Is making progress overall with RUE ROM, strength, and [place] skills. Pt. Is engaging in more tasks at home. Pt. continues to present with limited BUE strength, grip strength, pinch strength, and [place] skills. Pt.'s left hand is limited by 3rd digit discomfort, secondary to his Cortizone injection wearing off. Pt. continues to work on improving BUE strength, and [facility] skills in order to be able to improve his ability to reach for his clothing in closets, sew buttons  on his shirt,  Iron his clothes, and perform yardwork.             OT Education - 07/28/19 1439    Education provided  Yes    Education Details  HEP, [place], reaching    Person(s) Educated  Patient    Methods  Explanation;Demonstration    Comprehension  Verbalized understanding;Returned demonstration;Verbal cues required       OT Short Term Goals - 03/03/17 1459      OT SHORT TERM GOAL #1   Title  `        OT Long Term Goals - 07/28/19 1504      OT LONG TERM GOAL #4   Title  Pt. will perform self dressing with minA and A/E as needed.    Baseline  Pt. continues to require assist with donning underwear over feet. Min-ModA donning his jacket. (Depending on the jacket)    Time  12    Period  Weeks    Status  Partially Met    Target Date  10/20/19      Long Term Additional Goals   Additional Long Term Goals  Yes      OT LONG TERM GOAL #6   Title  Pt. will write his name efficiently with 100% legibility    Baseline  Pt continues to present with limited with  50% legibility and efficiency when writing name to sign in when riding the [place]    Time  12    Period  Weeks    Status  On-going    Target Date  10/20/19      OT LONG TERM GOAL #7   Title  Pt will independently and consistently follow HEP to increase UE strength to increase functional independence    Baseline  Pt. continues to work towards independence with exercises    Time  12    Period  Weeks    Status  On-going    Target Date  10/20/19      OT LONG TERM GOAL #8   Title  Pt. will require supervision ironing a shirt.    Baseline  Pt. has progressed CGA, and complete set-up seated using a tabletop iron    Time  12    Period  Weeks    Status  On-going    Target Date  10/20/19      OT LONG TERM GOAL  #9   Baseline  Pt. will independently be able to sew a button onto a shirt.    Time  12    Period  Weeks    Status  On-going    Target Date  10/20/19      OT LONG TERM GOAL  #10   TITLE  Pt. will independently be able to throw a ball    Baseline  Pt. continues to be limited with throwing overhand, however pt. is able to perform underhand throwing.    Time  12    Period  Weeks    Status  On-going      OT LONG TERM GOAL  #11   TITLE  Pt. will improve UE functional reaching to be able to independently use his RUE to hand his clothes in the closest.    Baseline  Pt. continues to be able to reach for clothing pulling them off of the hangers. Pt. is unable to reach up to place hangers onto the the rack.    Time  12    Period  Weeks  Status  On-going    Target Date  10/20/19      OT LONG TERM GOAL  #13   TITLE  Pt. will increase BUE strength to be able to prepare for assisting with yardwork.    Baseline  Pt. is unable    Time  12    Period  Weeks    Status  New            Plan - 07/28/19 1440    Clinical Impression Statement  Pt. Is making progress overall with RUE ROM, strength, and [facility] skills. Pt. Is engaging in more tasks at home. Pt. continues to present with  limited BUE strength, grip strength, pinch strength, and [place] skills. Pt.'s left hand is limited by 3rd digit discomfort, secondary to his Cortizone injection wearing off. Pt. continues to work on improving BUE strength, and [facility] skills in order to be able to improve his ability to reach for his clothing in closets, sew buttons on his shirt,  Iron his clothes, and perform yardwork.   OT Occupational Profile and History  Problem Focused Assessment - Including review of records relating to presenting problem    Occupational performance deficits (Please refer to evaluation for details):  ADL's;IADL's    Body Structure / Function / Physical Skills  ADL;FMC;ROM;IADL;Pain;Coordination    Rehab Potential  Fair    Clinical Decision Making  Several treatment options, min-mod task modification necessary    Comorbidities Affecting Occupational Performance:  May have comorbidities impacting occupational performance    Modification or Assistance to Complete Evaluation   Min-Moderate modification of tasks or assist with assess necessary to complete eval    OT Frequency  1x / week    OT Duration  12 weeks    OT Treatment/Interventions  Self-care/ADL training;Patient/family education;DME and/or AE instruction;Therapeutic exercise;Neuromuscular education       Patient will benefit from skilled therapeutic intervention in order to improve the following deficits and impairments:   Body Structure / Function / Physical Skills: ADL, FMC, ROM, IADL, Pain, Coordination       Visit Diagnosis: Muscle weakness (generalized)  Other lack of coordination    Problem List Patient Active Problem List   Diagnosis Date Noted  . Acute CVA (cerebrovascular accident) ([place]) 05/02/2016  . Left-sided weakness 05/01/2016    Harrel Carina, MS, OTR/L 07/28/2019, 3:15 PM  [place] MAIN [facility] SERVICES [address] [place], [place], [address] Phone: [phone]   Fax:   [phone]  Name: Curtis Powell MRN: [id number] Date of Birth: 1966-02-22

## 2019-07-28 NOTE — Progress Notes (Signed)
   Covid-19 Vaccination Clinic  Name:  Curtis Powell    MRN: [id number] DOB: [birth year]  07/28/2019  Mr. Curtis Powell was observed post Covid-19 immunization for 15 minutes without incident. He was provided with Vaccine Information Sheet and instruction to access the V-Safe system.   Mr. Curtis Powell was instructed to call 911 with any severe reactions post vaccine: Marland Kitchen Difficulty breathing  . Swelling of face and throat  . A fast heartbeat  . A bad rash all over body  . Dizziness and weakness   Immunizations Administered    Name Date Dose VIS Date Route   Pfizer COVID-19 Vaccine 07/28/2019  9:10 AM 0.3 mL 06/02/2018 Intramuscular   Manufacturer: [employer], [employer]   Lot: [id number]   [place]: [id number]

## 2019-07-28 NOTE — Therapy (Signed)
[place] MAIN [facility] SERVICES [address] [place], [place], [address] Phone: [phone]   Fax:  [phone]  Physical Therapy Treatment/RECERT  Patient Details  Name: Curtis Powell MRN: [id number] Date of Birth: [birth year] No data recorded  Encounter Date: 07/28/2019  PT End of Session - 07/28/19 1454    Visit Number  186    Number of Visits  210    Date for PT Re-Evaluation  10/20/19    PT Start Time  4315    PT Stop Time  1429    PT Time Calculation (min)  44 min    Equipment Utilized During Treatment  Gait belt    Activity Tolerance  Patient tolerated treatment well;No increased pain    Behavior During Therapy  WFL for tasks assessed/performed       Past Medical History:  Diagnosis Date  . GERD (gastroesophageal reflux disease)   . Hyperlipidemia   . Hypertension   . Obesity   . Stroke [facility])     Past Surgical History:  Procedure Laterality Date  . BRAIN SURGERY      There were no vitals filed for this visit.  Subjective Assessment - 07/28/19 1449    Subjective  Patient reports compliance with HEP. Had his second COVID vaccine this morning. Missed last session due to transportaion mixup.    Pertinent History   Patient is a pleasant 54 year old male who presents to physical therapy for weakness and immobility secondary to CVA.  Had a stroke in Feb 2018. He was previously fully independent, but this stroke caused severe residual deficits, mainly on the right side as well as speech, and now he is unable to walk or perform most of his ADLs on his own. Entire right side is very weak. He still has a little difficulty with speech but his swallowing is improved to baseline. His mom had to move in with him and now is his main caregiver.     Limitations  Sitting;Lifting;Standing;Walking;House hold activities    How long can you sit comfortably?  5 minutes    How long can you stand comfortably?  5 minutes    How long can you walk  comfortably?  10 ft    Patient Stated Goals  Walk without walker, walk further with walker, get some strength in back     Currently in Pain?  No/denies         [facility] PT Assessment - 07/28/19 0001      Berg Balance Test   Sit to Stand  Able to stand  independently using hands    Standing Unsupported  Able to stand 2 minutes with supervision    Sitting with Back Unsupported but Feet Supported on Floor or Stool  Able to sit safely and securely 2 minutes    Stand to Sit  Controls descent by using hands    Transfers  Able to transfer safely, definite need of hands    Standing Unsupported with Eyes Closed  Able to stand 10 seconds with supervision    Standing Unsupported with Feet Together  Able to place feet together independently but unable to hold for 30 seconds    From Standing, Reach Forward with Outstretched Arm  Can reach forward >5 cm safely (2")    From Standing Position, Pick up Object from Floor  Unable to pick up shoe, but reaches 2-5 cm (1-2") from shoe and balances independently    From Standing Position, Turn to Look  Behind Over each Shoulder  Looks behind one side only/other side shows less weight shift    Turn 360 Degrees  Needs assistance while turning    Standing Unsupported, Alternately Place Feet on Step/Stool  Able to complete >2 steps/needs minimal assist    Standing Unsupported, One Foot in Front  Able to take small step independently and hold 30 seconds    Standing on One Leg  Tries to lift leg/unable to hold 3 seconds but remains standing independently    Total Score  32      Patient reports he received his second COVID vaccine this morning.   Vitals at start of session: 126/67 pulse 96    Goals:  5x STS: 18.8 seconds first time; second trial attempt terminated due to near LOB BERG: 32/56  BLE strength  Right Left  Hip flexion 4/5 4/5  Hip Abduction 3+/5 3+/5  Hip Adduction 3/5 3/5  Knee Extension  4/5 4/5  Knee Flexion 4-/5 4-/5  DF 2+/5 3/5  PF 2+/5 3/5    LEFS: terminated due to limited patient cognition/ability to answer (changed answer with question repeated)  10 MWT: 51 seconds with RW and CGA   Treatment: 5lb ankle weight:  Seated marches 20x each LE Seated LAQ 20x each LE      Patient has met his BERG goal, and made progress with his STS, and strength goals. Patient additionally met his gait distance goal last month and will be given a new goal for progression of capacity for ambulation.  Will defer/no longer test for LEFS due to patient's limited cognition with questions. Due to patient's excellent progression he will be recerted for 12 weeks. Patient will continue to benefit from skilled therapy in order to improve dynamic standing balance and increase endurance            PT Education - 07/28/19 1453    Education provided  Yes    Education Details  goals, POC,    Person(s) Educated  Patient    Methods  Explanation;Demonstration;Tactile cues;Verbal cues    Comprehension  Verbalized understanding;Returned demonstration;Verbal cues required;Tactile cues required       PT Short Term Goals - 04/07/19 1504      PT SHORT TERM GOAL #1   Title  Patient will be independent in home exercise program to improve strength/mobility for better functional independence with ADLs.    Baseline  hep compliant    Time  2    Period  Weeks    Status  Achieved      PT SHORT TERM GOAL #2   Title  Patient will require min cueing for STS transfer with CGA for increased independence with mobility.     Baseline  CGA    Time  2    Period  Weeks    Status  Achieved      PT SHORT TERM GOAL #3   Title  Patient will maintain upright posture for > 15 seconds to demonstrate strengthened postural control muscualture     Baseline  18 seconds     Time  2    Period  Weeks    Status  Achieved        PT Long Term Goals - 07/28/19 0001      PT LONG TERM GOAL #1   Title  Patient will perform bed mobility (rolling and supine <>sit) with Mod I   to increase mobility and  perform ADLs.     Baseline  4/21: unable  to perform     Time  12    Period  Weeks    Status  New    Target Date  10/20/19      PT LONG TERM GOAL #2   Title  Patient (< 54 years old) will complete five times sit to stand test in < 10 seconds indicating an increased LE strength and improved balance.    Baseline  4/21: 18.8 seconds 06/02/19: 19.93 seconds one hand on RW, one hand on w/c 12/30: 16 secpmds BUE support 02/11/19: 36 seconds 5/19: 20.6s from [facility], one hand on walker and one on WC, 80% upright posture 7/1: 22 seconds one hand on walker one hand on w/c  9/14: 48 seconds with full upright posture    Time  12    Period  Weeks    Status  Partially Met    Target Date  10/20/19      PT LONG TERM GOAL #3   Title  Patient will increase Berg Balance score by > 6 points (26/56) to demonstrate decreased fall risk during functional activities.    Baseline  4/21: 32/56 06/02/19: 28/56 12/23: 25/56 9/12: 20/56 10/15: 21/56 11/7: 20/56 12/19: 22/56; 08/26/18: 11/56 7/1: 17/56  9/14: 23/56, 07/06/19=28/56    Time  8    Period  Weeks    Status  Achieved      PT LONG TERM GOAL #4   Title  Patient will increase BLE gross strength to 4+/5 as to improve functional strength for independent gait, increased standing tolerance and increased ADL ability.    Baseline   4/21: see note 06/02/19: hip flexion 4-/5, extension/abd 3/5 12/30: hip flexion 4/5 hip extension and abduction 3/5 02/11/19: hip flexion 4/5 hip extension and abduction 2+/54-/5 RLE, 10/15: 4/5 RLE 11/7: 4/5 RLE 3/3: 4/5 7/1:  hip flexion 4/5 hip extension 2/5 hip abduction/adduciton 2/5     Time  12    Period  Weeks    Status  Partially Met    Target Date  09/22/19      PT LONG TERM GOAL #5   Title  Patient will increase lower extremity functional scale to >40/80 to demonstrate improved functional mobility and increased tolerance with ADLs.     Baseline  4/21: terminated due to cognition 06/02/19: 31/80 12/30: 35/80  02/10/19: 34/56 14/80; 12/26: 21/80; 2/18: 28/80  4/10; 25/80 5/29: 27/80; 7/16: 29/80 8/22: 30/80  9/12: 26/80 (feeling more tired due to moving) 10/15: 37/80 11/7: 23/80 3/3: 32/80; 08/25/18: 28/80 9/15: 32/56    Status  Deferred      PT LONG TERM GOAL #6   Title  Patient will ambulate 200 ft with least assistive device and Supervision with no breaks to allow for increased mobility within home.    Baseline  4/21: ambulate 150 ft     Time  12    Period  Weeks    Status  New    Target Date  10/20/19      PT LONG TERM GOAL #7   Title  Patient will perform 10 MWT in <30 seconds for improved gait speed, gait mechanics, and functional capacity for mobility.     Baseline   07/28/19: 51 seconds with RW 06/02/19: 62.5 seconds with RW 11/4: 56 seconds with AFO and RW 12/30: 53 seconds with AFO and RW     Time  12    Period  Weeks    Status  Partially Met    Target Date  10/20/19  PT LONG TERM GOAL #8   Title  Patient will increase Berg Balance score by > 6 points (38/56) to demonstrate decreased fall risk during functional activities.    Baseline  07/28/19: 32/56     Time  12    Period  Weeks    Status  New    Target Date  10/20/19            Plan - 07/28/19 1457    Clinical Impression Statement  Patient has met his BERG goal, and made progress with his STS, and strength goals. Patient additionally met his gait distance goal last month and will be given a new goal for progression of capacity for ambulation.  Will defer/no longer test for LEFS due to patient's limited cognition with questions. Due to patient's excellent progression he will be recerted for 12 weeks. Patient will continue to benefit from skilled therapy in order to improve dynamic standing balance and increase endurance    Rehab Potential  Fair    Clinical Impairments Affecting Rehab Potential  hx of HTN, HLD, CVA, learning disability, Diabetes, brain tumor,     PT Frequency  2x / week    PT Duration  12 weeks    PT  Treatment/Interventions  ADLs/Self Care Home Management;Aquatic Therapy;Ultrasound;Moist Heat;Traction;DME Instruction;Gait training;Stair training;Functional mobility training;Therapeutic activities;Therapeutic exercise;Orthotic Fit/Training;Neuromuscular re-education;Balance training;Patient/family education;Manual techniques;Wheelchair mobility training;Passive range of motion;Energy conservation;Taping;Visual/perceptual remediation/compensation    PT Next Visit Plan  progress standing balance    PT Home Exercise Plan  TrA contraction, glute sets: needs updates. Potentially some time in supine flat in hospital bed, to improve hip extension deficits.     Consulted and Agree with Plan of Care  Patient       Patient will benefit from skilled therapeutic intervention in order to improve the following deficits and impairments:  Abnormal gait, Decreased activity tolerance, Decreased balance, Decreased knowledge of precautions, Decreased endurance, Decreased coordination, Decreased knowledge of use of DME, Decreased mobility, Decreased range of motion, Difficulty walking, Decreased safety awareness, Decreased strength, Impaired flexibility, Impaired perceived functional ability, Impaired tone, Postural dysfunction, Improper body mechanics, Pain  Visit Diagnosis: Muscle weakness (generalized)  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 Acute CVA (cerebrovascular accident) ([place]) 05/02/2016  . Left-sided weakness 05/01/2016   Janna Arch, PT, DPT   07/28/2019, 2:59 PM  [place] MAIN [facility] SERVICES [address] [place], [place], [address] Phone: [phone]   Fax:  [phone]  Name: Curtis Powell MRN: [id number] Date of Birth: Jul 26, 1965

## 2019-08-02 ENCOUNTER — Ambulatory Visit: Payer: Medicare HMO | Admitting: Occupational Therapy

## 2019-08-02 ENCOUNTER — Encounter: Payer: Self-pay | Admitting: Occupational Therapy

## 2019-08-02 ENCOUNTER — Ambulatory Visit: Payer: Medicare HMO

## 2019-08-02 ENCOUNTER — Other Ambulatory Visit: Payer: Self-pay

## 2019-08-02 DIAGNOSIS — R2689 Other abnormalities of gait and mobility: Secondary | ICD-10-CM

## 2019-08-02 DIAGNOSIS — M6281 Muscle weakness (generalized): Secondary | ICD-10-CM

## 2019-08-02 DIAGNOSIS — R278 Other lack of coordination: Secondary | ICD-10-CM

## 2019-08-02 DIAGNOSIS — I69351 Hemiplegia and hemiparesis following cerebral infarction affecting right dominant side: Secondary | ICD-10-CM

## 2019-08-02 NOTE — Therapy (Addendum)
[place] MAIN [facility] SERVICES [address] [place], [place], [address] Phone: [phone]   Fax:  [phone]  Occupational Therapy Treatment  Patient Details  Name: Curtis Powell MRN: [id number] Date of Birth: [birth year] No data recorded  Encounter Date: 08/02/2019  OT End of Session - 08/02/19 1436    Visit Number  [id number]    Number of Visits  209    Date for OT Re-Evaluation  10/20/19    Authorization Type  Progress reporting period starting 05/03/2019    OT Start Time  1435    OT Stop Time  1513    OT Time Calculation (min)  38 min    Equipment Utilized During Treatment  built up pen and adaptive scissors    Activity Tolerance  Patient tolerated treatment well    Behavior During Therapy  WFL for tasks assessed/performed       Past Medical History:  Diagnosis Date  . GERD (gastroesophageal reflux disease)   . Hyperlipidemia   . Hypertension   . Obesity   . Stroke [facility])     Past Surgical History:  Procedure Laterality Date  . BRAIN SURGERY      There were no vitals filed for this visit.  Subjective Assessment - 08/02/19 1435    Subjective   Pt. reports that his 3rd digit discomfort is starting to act up again.    Patient is accompanied by:  Family member    Pertinent History  Pt. is a 54 y.o. male who suffered a CVA on 05/01/2016. Pt. was admitted to the hospital. Once discharged, he received Home Health PT and OT services for about a month. Pt. has had multiple CVAs over the past 8 years, and has had multiple falls in the past 6 months. Pt. resides in an apartment. Pt. has caregivers for 80 hours. Pt.'s mother stays with pt. at night, and assists with IADL tasks.      Patient Stated Goals  To be able to throw a ball and dribble a ball, do as much as I can for myself.     Currently in Pain?  No/denies      OT TREATMENT  Neuromuscular re-ed:  Pt. worked on grasping, and manipulating 1/2" washers from a magnetic dish grasping  with 2pt. point pinch. Pt. Worked on translatory movements of the right hand moving the objects through his hand from his palm to the tip of his 2nd digit, and thumb.  Pt. worked on reaching up, stabilizing, and sustaining shoulder elevation while placing the washer over a small precise target on vertical dowels positioned at various angles.   Response to Treatment:  No cues were needed to avoid compensating proximally with hiking the right shoulder, and leaning to the left, or right when reaching.Pt. was able to maintain his trunk in midline for the duration of the task, with no compensation proximally.Pt. continues to require assist manipulatingsmall objects, storing them in his hand, and performing translatory movements.Less objects were dropped. Fewer rest breaks were required today.Pt. continues to work on improving RUE strength, and [facility] skills in order to work towards improving RUE functioning during ADLs, and IADL tasks.Plan to focus on [facility] skills, and writing in preparation for making a Mother's Day card.                      OT Education - 08/02/19 1436    Education Details  HEP, [place], reaching    Person(s) Educated  Patient    Methods  Explanation    Comprehension  Verbalized understanding;Returned demonstration;Verbal cues required       OT Short Term Goals - 03/03/17 1459      OT SHORT TERM GOAL #1   Title  `        OT Long Term Goals - 07/28/19 1504      OT LONG TERM GOAL #4   Title  Pt. will perform self dressing with minA and A/E as needed.    Baseline  Pt. continues to require assist with donning underwear over feet. Min-ModA donning his jacket. (Depending on the jacket)    Time  12    Period  Weeks    Status  Partially Met    Target Date  10/20/19      Long Term Additional Goals   Additional Long Term Goals  Yes      OT LONG TERM GOAL #6   Title  Pt. will write his name efficiently with 100% legibility    Baseline  Pt continues to  present with limited with 50% legibility and efficiency when writing name to sign in when riding the [place]    Time  12    Period  Weeks    Status  On-going    Target Date  10/20/19      OT LONG TERM GOAL #7   Title  Pt will independently and consistently follow HEP to increase UE strength to increase functional independence    Baseline  Pt. continues to work towards independence with exercises    Time  12    Period  Weeks    Status  On-going    Target Date  10/20/19      OT LONG TERM GOAL #8   Title  Pt. will require supervision ironing a shirt.    Baseline  Pt. has progressed CGA, and complete set-up seated using a tabletop iron    Time  12    Period  Weeks    Status  On-going    Target Date  10/20/19      OT LONG TERM GOAL  #9   Baseline  Pt. will independently be able to sew a button onto a shirt.    Time  12    Period  Weeks    Status  On-going    Target Date  10/20/19      OT LONG TERM GOAL  #10   TITLE  Pt. will independently be able to throw a ball    Baseline  Pt. continues to be limited with throwing overhand, however pt. is able to perform underhand throwing.    Time  12    Period  Weeks    Status  On-going      OT LONG TERM GOAL  #11   TITLE  Pt. will improve UE functional reaching to be able to independently use his RUE to hand his clothes in the closest.    Baseline  Pt. continues to be able to reach for clothing pulling them off of the hangers. Pt. is unable to reach up to place hangers onto the the rack.    Time  12    Period  Weeks    Status  On-going    Target Date  10/20/19      OT LONG TERM GOAL  #13   TITLE  Pt. will increase BUE strength to be able to prepare for assisting with yardwork.    Baseline  Pt. is  unable    Time  12    Period  Weeks    Status  New            Plan - 08/02/19 1437    Clinical Impression Statement No cues were needed to avoid compensating proximally with hiking the right shoulder, and leaning to the left, or right  when reaching.Pt. was able to maintain his trunk in midline for the duration of the task, with no compensation proximally.Pt. continues to require assist manipulatingsmall objects, storing them in his hand, and performing translatory movements.Less objects were dropped. Fewer rest breaks were required today.Pt. continues to work on improving RUE strength, and [facility] skills in order to work towards improving RUE functioning during ADLs, and IADL tasks.Plan to focus on [facility] skills, and writing in preparation for making a Mother's Day card.   OT Occupational Profile and History  Problem Focused Assessment - Including review of records relating to presenting problem    Occupational performance deficits (Please refer to evaluation for details):  ADL's;IADL's    Body Structure / Function / Physical Skills  ADL;FMC;ROM;IADL;Pain;Coordination    Rehab Potential  Fair    Clinical Decision Making  Several treatment options, min-mod task modification necessary    Comorbidities Affecting Occupational Performance:  May have comorbidities impacting occupational performance    Modification or Assistance to Complete Evaluation   Min-Moderate modification of tasks or assist with assess necessary to complete eval    OT Frequency  1x / week    OT Duration  12 weeks    OT Treatment/Interventions  Self-care/ADL training;Patient/family education;DME and/or AE instruction;Therapeutic exercise;Neuromuscular education    Consulted and Agree with Plan of Care  Patient       Patient will benefit from skilled therapeutic intervention in order to improve the following deficits and impairments:   Body Structure / Function / Physical Skills: ADL, FMC, ROM, IADL, Pain, Coordination       Visit Diagnosis: Muscle weakness (generalized)  Other lack of coordination    Problem List Patient Active Problem List   Diagnosis Date Noted  . Acute CVA (cerebrovascular accident) ([place]) 05/02/2016  . Left-sided weakness  05/01/2016    Harrel Carina, MS, OTR/L 08/02/2019, 4:46 PM  [place] MAIN [facility] SERVICES [address] [place], [place], [address] Phone: [phone]   Fax:  [phone]  Name: Curtis Powell MRN: [id number] Date of Birth: 10/13/1965

## 2019-08-02 NOTE — Therapy (Signed)
[place] MAIN [facility] SERVICES [address] [place], [place], [address] Phone: [phone]   Fax:  [phone]  Physical Therapy Treatment  Patient Details  Name: Curtis Powell MRN: [id number] Date of Birth: 11-Jun-1965 No data recorded  Encounter Date: 08/02/2019  PT End of Session - 08/02/19 1455    Visit Number  [id number]    Number of Visits  210    Date for PT Re-Evaluation  10/20/19    PT Start Time  1346    PT Stop Time  1430    PT Time Calculation (min)  44 min    Equipment Utilized During Treatment  Gait belt    Activity Tolerance  Patient tolerated treatment well;No increased pain    Behavior During Therapy  WFL for tasks assessed/performed       Past Medical History:  Diagnosis Date  . GERD (gastroesophageal reflux disease)   . Hyperlipidemia   . Hypertension   . Obesity   . Stroke [facility])     Past Surgical History:  Procedure Laterality Date  . BRAIN SURGERY      There were no vitals filed for this visit.  Subjective Assessment - 08/02/19 1442    Subjective  Patient reports no falls or LOB since last sesssion. Has been compliant with HEP.    Pertinent History   Patient is a pleasant 54 year old male who presents to physical therapy for weakness and immobility secondary to CVA.  Had a stroke in Feb 2018. He was previously fully independent, but this stroke caused severe residual deficits, mainly on the right side as well as speech, and now he is unable to walk or perform most of his ADLs on his own. Entire right side is very weak. He still has a little difficulty with speech but his swallowing is improved to baseline. His mom had to move in with him and now is his main caregiver.     Limitations  Sitting;Lifting;Standing;Walking;House hold activities    How long can you sit comfortably?  5 minutes    How long can you stand comfortably?  5 minutes    How long can you walk comfortably?  10 ft    Patient Stated Goals  Walk without  walker, walk further with walker, get some strength in back     Currently in Pain?  No/denies        Bed mobility:  Sit to stand from w/c and RW, to plinth table, sit to supine initially with Min A to LE's. Bridging with stabilization to R foot x 10 for movement into center of bed and positioning to top of table.   Education on rolling R and L , cues for reaching with opp LE to "railing" bend knee and roll x 2 trials each direction,   Supine to sidelying to sit EOB with Min A x 3 trials with reduction of assistance with repetition.     Patient ambulates with RW100 ftwithCGA, cues for shift of weight with progression of limb and upright posture.and lateral sway with decreased knee flex and hip flex.Upon assistance of w/c follow tactile cueing to PSIS and ASIS for optimal rotation and progression.   Standing with RW:  -no UE support static stand, horizontal head turns 10x, vertical head turns 10x, two near LOB with horizontal head turns -reaching with RUE to grab ball and throw at target x 15   Sit to stand with decreasing reliance upon UE's. 2x, single UE support able  to stabilize self once in standing without UE support with Min A  Seated:  BTB: hamstring curl 15x each LE BTB marches 15x each LE   Vitals monitored throughout physical therapy with occasional rest breaks to return to therapeutic range. Able to return quickly with seated rest break.  Pt educated throughout session about proper posture and technique with exercises. Improved exercise technique, movement at target joints, use of target muscles after min to mod verbal, visual, tactile cues.                      PT Education - 08/02/19 1454    Education provided  Yes    Education Details  exercise technique    Person(s) Educated  Patient    Methods  Explanation;Demonstration;Tactile cues;Verbal cues    Comprehension  Verbalized understanding;Returned demonstration;Verbal cues  required;Tactile cues required       PT Short Term Goals - 04/07/19 1504      PT SHORT TERM GOAL #1   Title  Patient will be independent in home exercise program to improve strength/mobility for better functional independence with ADLs.    Baseline  hep compliant    Time  2    Period  Weeks    Status  Achieved      PT SHORT TERM GOAL #2   Title  Patient will require min cueing for STS transfer with CGA for increased independence with mobility.     Baseline  CGA    Time  2    Period  Weeks    Status  Achieved      PT SHORT TERM GOAL #3   Title  Patient will maintain upright posture for > 15 seconds to demonstrate strengthened postural control muscualture     Baseline  18 seconds     Time  2    Period  Weeks    Status  Achieved        PT Long Term Goals - 07/28/19 0001      PT LONG TERM GOAL #1   Title  Patient will perform bed mobility (rolling and supine <>sit) with Mod I  to increase mobility and  perform ADLs.     Baseline  4/21: unable to perform     Time  12    Period  Weeks    Status  New    Target Date  10/20/19      PT LONG TERM GOAL #2   Title  Patient (< 8 years old) will complete five times sit to stand test in < 10 seconds indicating an increased LE strength and improved balance.    Baseline  4/21: 18.8 seconds 06/02/19: 19.93 seconds one hand on RW, one hand on w/c 12/30: 16 secpmds BUE support 02/11/19: 36 seconds 5/19: 20.6s from [facility], one hand on walker and one on WC, 80% upright posture 7/1: 22 seconds one hand on walker one hand on w/c  9/14: 48 seconds with full upright posture    Time  12    Period  Weeks    Status  Partially Met    Target Date  10/20/19      PT LONG TERM GOAL #3   Title  Patient will increase Berg Balance score by > 6 points (26/56) to demonstrate decreased fall risk during functional activities.    Baseline  4/21: 32/56 06/02/19: 28/56 12/23: 25/56 9/12: 20/56 10/15: 21/56 11/7: 20/56 12/19: 22/56; 08/26/18: 11/56 7/1: 17/56  9/14:  23/56, 07/06/19=28/56  Time  8    Period  Weeks    Status  Achieved      PT LONG TERM GOAL #4   Title  Patient will increase BLE gross strength to 4+/5 as to improve functional strength for independent gait, increased standing tolerance and increased ADL ability.    Baseline   4/21: see note 06/02/19: hip flexion 4-/5, extension/abd 3/5 12/30: hip flexion 4/5 hip extension and abduction 3/5 02/11/19: hip flexion 4/5 hip extension and abduction 2+/54-/5 RLE, 10/15: 4/5 RLE 11/7: 4/5 RLE 3/3: 4/5 7/1:  hip flexion 4/5 hip extension 2/5 hip abduction/adduciton 2/5     Time  12    Period  Weeks    Status  Partially Met    Target Date  09/22/19      PT LONG TERM GOAL #5   Title  Patient will increase lower extremity functional scale to >40/80 to demonstrate improved functional mobility and increased tolerance with ADLs.     Baseline  4/21: terminated due to cognition 06/02/19: 31/80 12/30: 35/80 02/10/19: 34/56 14/80; 12/26: 21/80; 2/18: 28/80  4/10; 25/80 5/29: 27/80; 7/16: 29/80 8/22: 30/80  9/12: 26/80 (feeling more tired due to moving) 10/15: 37/80 11/7: 23/80 3/3: 32/80; 08/25/18: 28/80 9/15: 32/56    Status  Deferred      PT LONG TERM GOAL #6   Title  Patient will ambulate 200 ft with least assistive device and Supervision with no breaks to allow for increased mobility within home.    Baseline  4/21: ambulate 150 ft     Time  12    Period  Weeks    Status  New    Target Date  10/20/19      PT LONG TERM GOAL #7   Title  Patient will perform 10 MWT in <30 seconds for improved gait speed, gait mechanics, and functional capacity for mobility.     Baseline   07/28/19: 51 seconds with RW 06/02/19: 62.5 seconds with RW 11/4: 56 seconds with AFO and RW 12/30: 53 seconds with AFO and RW     Time  12    Period  Weeks    Status  Partially Met    Target Date  10/20/19      PT LONG TERM GOAL #8   Title  Patient will increase Berg Balance score by > 6 points (38/56) to demonstrate decreased fall risk  during functional activities.    Baseline  07/28/19: 32/56     Time  12    Period  Weeks    Status  New    Target Date  10/20/19            Plan - 08/02/19 1455    Clinical Impression Statement  Patient presents to physical therapy with excellent motivation. Education on bed mobility and transitions with decreasing assistance from PT. Ambulation on slick surface is more challenging for transition for walker. Patient will continue to benefit from skilled therapy in order to improve dynamic standing balance and increase endurance    Rehab Potential  Fair    Clinical Impairments Affecting Rehab Potential  hx of HTN, HLD, CVA, learning disability, Diabetes, brain tumor,     PT Frequency  2x / week    PT Duration  12 weeks    PT Treatment/Interventions  ADLs/Self Care Home Management;Aquatic Therapy;Ultrasound;Moist Heat;Traction;DME Instruction;Gait training;Stair training;Functional mobility training;Therapeutic activities;Therapeutic exercise;Orthotic Fit/Training;Neuromuscular re-education;Balance training;Patient/family education;Manual techniques;Wheelchair mobility training;Passive range of motion;Energy conservation;Taping;Visual/perceptual remediation/compensation    PT Next Visit  Plan  progress standing balance    PT Home Exercise Plan  TrA contraction, glute sets: needs updates. Potentially some time in supine flat in hospital bed, to improve hip extension deficits.     Consulted and Agree with Plan of Care  Patient       Patient will benefit from skilled therapeutic intervention in order to improve the following deficits and impairments:  Abnormal gait, Decreased activity tolerance, Decreased balance, Decreased knowledge of precautions, Decreased endurance, Decreased coordination, Decreased knowledge of use of DME, Decreased mobility, Decreased range of motion, Difficulty walking, Decreased safety awareness, Decreased strength, Impaired flexibility, Impaired perceived functional  ability, Impaired tone, Postural dysfunction, Improper body mechanics, Pain  Visit Diagnosis: Muscle weakness (generalized)  Other lack of coordination  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 Acute CVA (cerebrovascular accident) ([place]) 05/02/2016  . Left-sided weakness 05/01/2016   Janna Arch, PT, DPT   08/02/2019, 2:57 PM  [place] MAIN [facility] SERVICES [address] [place], [place], [address] Phone: [phone]   Fax:  [phone]  Name: Curtis Powell MRN: [id number] Date of Birth: [birth year]

## 2019-08-04 ENCOUNTER — Ambulatory Visit: Payer: Medicare HMO

## 2019-08-04 ENCOUNTER — Other Ambulatory Visit: Payer: Self-pay

## 2019-08-04 ENCOUNTER — Ambulatory Visit: Payer: Medicare HMO | Admitting: Occupational Therapy

## 2019-08-04 ENCOUNTER — Encounter: Payer: Self-pay | Admitting: Occupational Therapy

## 2019-08-04 DIAGNOSIS — M6281 Muscle weakness (generalized): Secondary | ICD-10-CM | POA: Diagnosis not present

## 2019-08-04 DIAGNOSIS — I69351 Hemiplegia and hemiparesis following cerebral infarction affecting right dominant side: Secondary | ICD-10-CM

## 2019-08-04 DIAGNOSIS — R278 Other lack of coordination: Secondary | ICD-10-CM

## 2019-08-04 DIAGNOSIS — R2689 Other abnormalities of gait and mobility: Secondary | ICD-10-CM

## 2019-08-04 NOTE — Therapy (Signed)
[place] MAIN [facility] SERVICES [address] [place], [place], [address] Phone: [phone]   Fax:  [phone]  Physical Therapy Treatment  Patient Details  Name: Curtis Powell MRN: [id number] Date of Birth: 03-Jul-1965 No data recorded  Encounter Date: 08/04/2019  PT End of Session - 08/04/19 1354    Visit Number  [id number]    Number of Visits  210    Date for PT Re-Evaluation  10/20/19    PT Start Time  1346    PT Stop Time  1430    PT Time Calculation (min)  44 min    Equipment Utilized During Treatment  Gait belt    Activity Tolerance  Patient tolerated treatment well;No increased pain    Behavior During Therapy  WFL for tasks assessed/performed       Past Medical History:  Diagnosis Date  . GERD (gastroesophageal reflux disease)   . Hyperlipidemia   . Hypertension   . Obesity   . Stroke [facility])     Past Surgical History:  Procedure Laterality Date  . BRAIN SURGERY      There were no vitals filed for this visit.  Subjective Assessment - 08/04/19 1353    Subjective  Patient reports he has a new aide today, fired his old aide. Patient reports no falls or LOB since last session.    Pertinent History   Patient is a pleasant 54 year old male who presents to physical therapy for weakness and immobility secondary to CVA.  Had a stroke in Feb 2018. He was previously fully independent, but this stroke caused severe residual deficits, mainly on the right side as well as speech, and now he is unable to walk or perform most of his ADLs on his own. Entire right side is very weak. He still has a little difficulty with speech but his swallowing is improved to baseline. His mom had to move in with him and now is his main caregiver.     Limitations  Sitting;Lifting;Standing;Walking;House hold activities    How long can you sit comfortably?  5 minutes    How long can you stand comfortably?  5 minutes    How long can you walk comfortably?  10 ft    Patient Stated Goals  Walk without walker, walk further with walker, get some strength in back     Currently in Pain?  No/denies           Bed mobility:  Sit to stand from w/c and RW, to plinth table, sit to supine initially with Min A to LE's. Bridging with stabilization to R foot x 10 for movement into center of bed and positioning to top of table.    Education on rolling R and L , cues for reaching with opp LE to "railing" bend knee and roll x 2 trials each direction,    Supine to sidelying to sit EOB with Min A x 3 trials with reduction of assistance with repetition.         Patient ambulates with RW 100 ft with CGA, cues for shift of weight with progression of limb and upright posture.  and lateral sway with decreased knee flex and hip flex. Upon assistance of w/c follow tactile cueing to PSIS and ASIS for optimal rotation and progression.    Standing with RW:  -no UE support static stand, horizontal head turns 10x, vertical head turns 10x, two near LOB with horizontal head turns -reaching with RUE to grab  ball and throw at target x 15    Sit to stand with decreasing reliance upon UE's. 2x, single UE support able to stabilize self once in standing without UE support with Min A   Seated:   5lb ankle weight  -marches 15x each LE -LAQ 15x each LE     Vitals monitored throughout physical therapy with occasional rest breaks to return to therapeutic range. Able to return quickly with seated rest break.    Pt educated throughout session about proper posture and technique with exercises. Improved exercise technique, movement at target joints, use of target muscles after min to mod verbal, visual, tactile cues.                   PT Education - 08/04/19 1354    Education provided  Yes    Education Details  exercise technique, body mechanics    Person(s) Educated  Patient    Methods  Explanation;Demonstration;Tactile cues;Verbal cues    Comprehension  Verbalized  understanding;Returned demonstration;Verbal cues required;Tactile cues required       PT Short Term Goals - 04/07/19 1504      PT SHORT TERM GOAL #1   Title  Patient will be independent in home exercise program to improve strength/mobility for better functional independence with ADLs.    Baseline  hep compliant    Time  2    Period  Weeks    Status  Achieved      PT SHORT TERM GOAL #2   Title  Patient will require min cueing for STS transfer with CGA for increased independence with mobility.     Baseline  CGA    Time  2    Period  Weeks    Status  Achieved      PT SHORT TERM GOAL #3   Title  Patient will maintain upright posture for > 15 seconds to demonstrate strengthened postural control muscualture     Baseline  18 seconds     Time  2    Period  Weeks    Status  Achieved        PT Long Term Goals - 07/28/19 0001      PT LONG TERM GOAL #1   Title  Patient will perform bed mobility (rolling and supine <>sit) with Mod I  to increase mobility and  perform ADLs.     Baseline  4/21: unable to perform     Time  12    Period  Weeks    Status  New    Target Date  10/20/19      PT LONG TERM GOAL #2   Title  Patient (< 17 years old) will complete five times sit to stand test in < 10 seconds indicating an increased LE strength and improved balance.    Baseline  4/21: 18.8 seconds 06/02/19: 19.93 seconds one hand on RW, one hand on w/c 12/30: 16 secpmds BUE support 02/11/19: 36 seconds 5/19: 20.6s from [facility], one hand on walker and one on WC, 80% upright posture 7/1: 22 seconds one hand on walker one hand on w/c  9/14: 48 seconds with full upright posture    Time  12    Period  Weeks    Status  Partially Met    Target Date  10/20/19      PT LONG TERM GOAL #3   Title  Patient will increase Berg Balance score by > 6 points (26/56) to demonstrate decreased fall risk during functional  activities.    Baseline  4/21: 32/56 06/02/19: 28/56 12/23: 25/56 9/12: 20/56 10/15: 21/56 11/7: 20/56  12/19: 22/56; 08/26/18: 11/56 7/1: 17/56  9/14: 23/56, 07/06/19=28/56    Time  8    Period  Weeks    Status  Achieved      PT LONG TERM GOAL #4   Title  Patient will increase BLE gross strength to 4+/5 as to improve functional strength for independent gait, increased standing tolerance and increased ADL ability.    Baseline   4/21: see note 06/02/19: hip flexion 4-/5, extension/abd 3/5 12/30: hip flexion 4/5 hip extension and abduction 3/5 02/11/19: hip flexion 4/5 hip extension and abduction 2+/54-/5 RLE, 10/15: 4/5 RLE 11/7: 4/5 RLE 3/3: 4/5 7/1:  hip flexion 4/5 hip extension 2/5 hip abduction/adduciton 2/5     Time  12    Period  Weeks    Status  Partially Met    Target Date  09/22/19      PT LONG TERM GOAL #5   Title  Patient will increase lower extremity functional scale to >40/80 to demonstrate improved functional mobility and increased tolerance with ADLs.     Baseline  4/21: terminated due to cognition 06/02/19: 31/80 12/30: 35/80 02/10/19: 34/56 14/80; 12/26: 21/80; 2/18: 28/80  4/10; 25/80 5/29: 27/80; 7/16: 29/80 8/22: 30/80  9/12: 26/80 (feeling more tired due to moving) 10/15: 37/80 11/7: 23/80 3/3: 32/80; 08/25/18: 28/80 9/15: 32/56    Status  Deferred      PT LONG TERM GOAL #6   Title  Patient will ambulate 200 ft with least assistive device and Supervision with no breaks to allow for increased mobility within home.    Baseline  4/21: ambulate 150 ft     Time  12    Period  Weeks    Status  New    Target Date  10/20/19      PT LONG TERM GOAL #7   Title  Patient will perform 10 MWT in <30 seconds for improved gait speed, gait mechanics, and functional capacity for mobility.     Baseline   07/28/19: 51 seconds with RW 06/02/19: 62.5 seconds with RW 11/4: 56 seconds with AFO and RW 12/30: 53 seconds with AFO and RW     Time  12    Period  Weeks    Status  Partially Met    Target Date  10/20/19      PT LONG TERM GOAL #8   Title  Patient will increase Berg Balance score by > 6  points (38/56) to demonstrate decreased fall risk during functional activities.    Baseline  07/28/19: 32/56     Time  12    Period  Weeks    Status  New    Target Date  10/20/19            Plan - 08/04/19 1428    Clinical Impression Statement  Patient demonstrated improved ambulatory body mechanics with decreased need for cues for upright posture this session. Bed mobility continues to be an area of improvement at this time. Patient will continue to benefit from skilled therapy in order to improve dynamic standing balance and increase endurance    Rehab Potential  Fair    Clinical Impairments Affecting Rehab Potential  hx of HTN, HLD, CVA, learning disability, Diabetes, brain tumor,     PT Frequency  2x / week    PT Duration  12 weeks    PT Treatment/Interventions  ADLs/Self  Care Home Management;Aquatic Therapy;Ultrasound;Moist Heat;Traction;DME Instruction;Gait training;Stair training;Functional mobility training;Therapeutic activities;Therapeutic exercise;Orthotic Fit/Training;Neuromuscular re-education;Balance training;Patient/family education;Manual techniques;Wheelchair mobility training;Passive range of motion;Energy conservation;Taping;Visual/perceptual remediation/compensation    PT Next Visit Plan  progress standing balance    PT Home Exercise Plan  TrA contraction, glute sets: needs updates. Potentially some time in supine flat in hospital bed, to improve hip extension deficits.     Consulted and Agree with Plan of Care  Patient       Patient will benefit from skilled therapeutic intervention in order to improve the following deficits and impairments:  Abnormal gait, Decreased activity tolerance, Decreased balance, Decreased knowledge of precautions, Decreased endurance, Decreased coordination, Decreased knowledge of use of DME, Decreased mobility, Decreased range of motion, Difficulty walking, Decreased safety awareness, Decreased strength, Impaired flexibility, Impaired  perceived functional ability, Impaired tone, Postural dysfunction, Improper body mechanics, Pain  Visit Diagnosis: Muscle weakness (generalized)  Other lack of coordination  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 Acute CVA (cerebrovascular accident) ([place]) 05/02/2016  . Left-sided weakness 05/01/2016   Janna Arch, PT, DPT   08/04/2019, 2:35 PM  [place] MAIN [facility] SERVICES [address] [place], [place], [address] Phone: [phone]   Fax:  [phone]  Name: Curtis Powell MRN: [id number] Date of Birth: Sep 27, 1965

## 2019-08-04 NOTE — Therapy (Signed)
[place] MAIN [facility] SERVICES [address] [place], [place], [address] Phone: [phone]   Fax:  [phone]  Occupational Therapy Treatment  Patient Details  Name: Curtis Powell MRN: [id number] Date of Birth: [birth year] No data recorded  Encounter Date: 08/04/2019  OT End of Session - 08/04/19 1503    Visit Number  [id number]    Number of Visits  209    Date for OT Re-Evaluation  10/20/19    Authorization Type  Progress reporting period starting 05/03/2019    OT Start Time  1430    OT Stop Time  1515    OT Time Calculation (min)  45 min    Equipment Utilized During Treatment  built up pen and adaptive scissors    Activity Tolerance  Patient tolerated treatment well    Behavior During Therapy  WFL for tasks assessed/performed       Past Medical History:  Diagnosis Date  . GERD (gastroesophageal reflux disease)   . Hyperlipidemia   . Hypertension   . Obesity   . Stroke [facility])     Past Surgical History:  Procedure Laterality Date  . BRAIN SURGERY      There were no vitals filed for this visit.  Subjective Assessment - 08/04/19 1501    Subjective   Pt. reports that his 3rd digit discomfort is starting to act up again.    Patient is accompanied by:  Family member    Pertinent History  Pt. is a 54 y.o. male who suffered a CVA on 05/01/2016. Pt. was admitted to the hospital. Once discharged, he received Home Health PT and OT services for about a month. Pt. has had multiple CVAs over the past 8 years, and has had multiple falls in the past 6 months. Pt. resides in an apartment. Pt. has caregivers for 80 hours. Pt.'s mother stays with pt. at night, and assists with IADL tasks.      Patient Stated Goals  To be able to throw a ball and dribble a ball, do as much as I can for myself.     Currently in Pain?  No/denies      OT TREATMENT  Neuromuscular re-ed:  Pt. worked on using his right hand for grasping, and manipulating 1", 3/4", and 1/2"  washers from a magnetic dish grasping with 2pt. point pinch. Pt. worked on translatory movements of the right hand moving the objects through his hand from his palm to the tip of his 2nd digit, and thumb, then reaching up to place them onto magnetic hooks positioned on an elevated surface above the tabletop.  Pt. performed [facility] tasks using the Grooved pegboard. Pt. worked on grasping the grooved pegs from a horizontal position, and moving the pegs to a vertical position in the hand to prepare for placing them in the grooved slot. Pt. Removed the grooved pegs alternating thumb opposition to the tip of his 2nd digit to his thumb.   Response to Treatment:  Pt. reports that he has to have imaging on his brain tomorrow to follow-up from a brain tumor that he was diagnosed with as a child.  Pt. Is making progress overall. No cues were needed to avoid compensating proximally with hiking the right shoulder, and leaning to the left, or right when reaching.Pt. was able to maintain his trunk in midline for the duration of the task, with no compensation proximally.Pt. continues to require assist manipulatingsmall objects, storing them in his hand, and performing translatory movements.Less  objects were dropped. Fewer rest breaks were required today.Pt. continues to work on improving RUE strength, and [facility] skills in order to work towards improving RUE functioning during ADLs, and IADL tasks.Plan to focus on [facility] skills, and writing in preparation for making a Mother's Day card                     OT Education - 08/04/19 1502    Education provided  Yes    Education Details  HEP, [place], reaching    Person(s) Educated  Patient    Methods  Explanation    Comprehension  Verbalized understanding;Returned demonstration;Verbal cues required       OT Short Term Goals - 03/03/17 1459      OT SHORT TERM GOAL #1   Title  `        OT Long Term Goals - 07/28/19 1504      OT LONG TERM GOAL #4    Title  Pt. will perform self dressing with minA and A/E as needed.    Baseline  Pt. continues to require assist with donning underwear over feet. Min-ModA donning his jacket. (Depending on the jacket)    Time  12    Period  Weeks    Status  Partially Met    Target Date  10/20/19      Long Term Additional Goals   Additional Long Term Goals  Yes      OT LONG TERM GOAL #6   Title  Pt. will write his name efficiently with 100% legibility    Baseline  Pt continues to present with limited with 50% legibility and efficiency when writing name to sign in when riding the [place]    Time  12    Period  Weeks    Status  On-going    Target Date  10/20/19      OT LONG TERM GOAL #7   Title  Pt will independently and consistently follow HEP to increase UE strength to increase functional independence    Baseline  Pt. continues to work towards independence with exercises    Time  12    Period  Weeks    Status  On-going    Target Date  10/20/19      OT LONG TERM GOAL #8   Title  Pt. will require supervision ironing a shirt.    Baseline  Pt. has progressed CGA, and complete set-up seated using a tabletop iron    Time  12    Period  Weeks    Status  On-going    Target Date  10/20/19      OT LONG TERM GOAL  #9   Baseline  Pt. will independently be able to sew a button onto a shirt.    Time  12    Period  Weeks    Status  On-going    Target Date  10/20/19      OT LONG TERM GOAL  #10   TITLE  Pt. will independently be able to throw a ball    Baseline  Pt. continues to be limited with throwing overhand, however pt. is able to perform underhand throwing.    Time  12    Period  Weeks    Status  On-going      OT LONG TERM GOAL  #11   TITLE  Pt. will improve UE functional reaching to be able to independently use his RUE to hand his clothes in the closest.  Baseline  Pt. continues to be able to reach for clothing pulling them off of the hangers. Pt. is unable to reach up to place hangers onto the  the rack.    Time  12    Period  Weeks    Status  On-going    Target Date  10/20/19      OT LONG TERM GOAL  #13   TITLE  Pt. will increase BUE strength to be able to prepare for assisting with yardwork.    Baseline  Pt. is unable    Time  12    Period  Weeks    Status  New            Plan - 08/04/19 1503    Clinical Impression Statement  Pt. reports that he has to have imaging on his brain tomorrow to follow-up from a brain tumor that he was diagnosed with as a child.  Pt. Is making progress overall. No cues were needed to avoid compensating proximally with hiking the right shoulder, and leaning to the left, or right when reaching.Pt. was able to maintain his trunk in midline for the duration of the task, with no compensation proximally.Pt. continues to require assist manipulatingsmall objects, storing them in his hand, and performing translatory movements.Less objects were dropped. Fewer rest breaks were required today.Pt. continues to work on improving RUE strength, and [facility] skills in order to work towards improving RUE functioning during ADLs, and IADL tasks.Plan to focus on [facility] skills, and writing in preparation for making a Mother's Day card      OT Occupational Profile and History  Problem Focused Assessment - Including review of records relating to presenting problem    Occupational performance deficits (Please refer to evaluation for details):  ADL's;IADL's    Body Structure / Function / Physical Skills  ADL;FMC;ROM;IADL;Pain;Coordination    Rehab Potential  Fair    Clinical Decision Making  Several treatment options, min-mod task modification necessary    Comorbidities Affecting Occupational Performance:  May have comorbidities impacting occupational performance    Modification or Assistance to Complete Evaluation   Min-Moderate modification of tasks or assist with assess necessary to complete eval    OT Frequency  1x / week    OT Duration  12 weeks    OT  Treatment/Interventions  Self-care/ADL training;Patient/family education;DME and/or AE instruction;Therapeutic exercise;Neuromuscular education    Consulted and Agree with Plan of Care  Patient       Patient will benefit from skilled therapeutic intervention in order to improve the following deficits and impairments:   Body Structure / Function / Physical Skills: ADL, FMC, ROM, IADL, Pain, Coordination       Visit Diagnosis: Muscle weakness (generalized)    Problem List Patient Active Problem List   Diagnosis Date Noted  . Acute CVA (cerebrovascular accident) ([place]) 05/02/2016  . Left-sided weakness 05/01/2016    Harrel Carina, MS, OTR/L 08/04/2019, 3:06 PM  [place] MAIN [facility] SERVICES [address] [place], [place], [address] Phone: [phone]   Fax:  [phone]  Name: Curtis Powell MRN: [id number] Date of Birth: Sep 13, 1965

## 2019-08-05 ENCOUNTER — Ambulatory Visit
Admission: RE | Admit: 2019-08-05 | Discharge: 2019-08-05 | Disposition: A | Discharge status: Home / Self Care | Payer: Medicare HMO | Source: Ambulatory Visit | Attending: Neurology | Admitting: Neurology

## 2019-08-05 DIAGNOSIS — I6389 Other cerebral infarction: Secondary | ICD-10-CM | POA: Diagnosis present

## 2019-08-05 MED ORDER — GADOBUTROL 1 MMOL/ML IV SOLN
9.0000 mL | Freq: Once | INTRAVENOUS | Status: AC | PRN
  Administered 2019-08-05: 9 mL via INTRAVENOUS

## 2019-08-09 ENCOUNTER — Other Ambulatory Visit: Payer: Self-pay

## 2019-08-09 ENCOUNTER — Ambulatory Visit: Payer: Medicare HMO | Attending: Family Medicine

## 2019-08-09 ENCOUNTER — Encounter: Payer: Self-pay | Admitting: Occupational Therapy

## 2019-08-09 ENCOUNTER — Ambulatory Visit: Payer: Medicare HMO | Admitting: Occupational Therapy

## 2019-08-09 DIAGNOSIS — M6281 Muscle weakness (generalized): Secondary | ICD-10-CM

## 2019-08-09 DIAGNOSIS — I639 Cerebral infarction, unspecified: Secondary | ICD-10-CM | POA: Insufficient documentation

## 2019-08-09 DIAGNOSIS — R278 Other lack of coordination: Secondary | ICD-10-CM | POA: Diagnosis present

## 2019-08-09 DIAGNOSIS — I69351 Hemiplegia and hemiparesis following cerebral infarction affecting right dominant side: Secondary | ICD-10-CM

## 2019-08-09 DIAGNOSIS — R2689 Other abnormalities of gait and mobility: Secondary | ICD-10-CM

## 2019-08-09 DIAGNOSIS — R531 Weakness: Secondary | ICD-10-CM | POA: Diagnosis present

## 2019-08-09 NOTE — Therapy (Signed)
[place] MAIN [facility] SERVICES [address] [place], [place], [address] Phone: [phone]   Fax:  [phone]  Physical Therapy Treatment  Patient Details  Name: Curtis Powell MRN: [id number] Date of Birth: 02-02-1966 No data recorded  Encounter Date: 08/09/2019  PT End of Session - 08/09/19 1425    Visit Number  [id number]    Number of Visits  210    Date for PT Re-Evaluation  10/20/19    PT Start Time  0350    PT Stop Time  1429    PT Time Calculation (min)  42 min    Equipment Utilized During Treatment  Gait belt    Activity Tolerance  Patient tolerated treatment well;No increased pain    Behavior During Therapy  WFL for tasks assessed/performed       Past Medical History:  Diagnosis Date  . GERD (gastroesophageal reflux disease)   . Hyperlipidemia   . Hypertension   . Obesity   . Stroke [facility])     Past Surgical History:  Procedure Laterality Date  . BRAIN SURGERY      There were no vitals filed for this visit.  Subjective Assessment - 08/09/19 1423    Subjective  Patient reports his new aide is doing well. Got an MRI since he was seen last, learns his results next week when he goes to the doctor.    Pertinent History   Patient is a pleasant 54 year old male who presents to physical therapy for weakness and immobility secondary to CVA.  Had a stroke in Feb 2018. He was previously fully independent, but this stroke caused severe residual deficits, mainly on the right side as well as speech, and now he is unable to walk or perform most of his ADLs on his own. Entire right side is very weak. He still has a little difficulty with speech but his swallowing is improved to baseline. His mom had to move in with him and now is his main caregiver.     Limitations  Sitting;Lifting;Standing;Walking;House hold activities    How long can you sit comfortably?  5 minutes    How long can you stand comfortably?  5 minutes    How long can you walk  comfortably?  10 ft    Patient Stated Goals  Walk without walker, walk further with walker, get some strength in back     Currently in Pain?  No/denies            Patient ambulates with RW 200 ft with CGA, cues for shift of weight with progression of limb and upright posture.  and lateral sway with decreased knee flex and hip flex. Upon assistance of w/c follow tactile cueing to PSIS and ASIS for optimal rotation and progression. one seated rest break.    Standing with RW:  -no UE support static stand, horizontal head turns 10x, vertical head turns 10x, two near LOB with horizontal head turns -reaching with RUE to grab ball and throw at target x 15    Sit to stand with decreasing reliance upon UE's. 2x, single UE support able to stabilize self once in standing without UE support with Min A   Seated:   on plinth table with no back support for core activation and trunk stability Cross body punches 12x each UE Cross body punches with duck x 8 each UE Elbows 15x for cross body coordination and sequencing uppercross x 15 each UE for core stabilization  Sit  to stand from raised plinth table; SUE support 10x; cues for utilizing LE and glute activation  5lb ankle weight  -marches 15x each LE -LAQ 15x each LE  -ER/IR 10x each LE    Vitals monitored throughout physical therapy with occasional rest breaks to return to therapeutic range. Able to return quickly with seated rest break.    Pt educated throughout session about proper posture and technique with exercises. Improved exercise technique, movement at target joints, use of target muscles after min to mod verbal, visual, tactile cues.    Patient is challenged with maintaining upright trunk posture with frequent tendency towards forward trunk lean with fatigue. Patient demonstrated increased foot shuffle/decreased foot clearance of R foot with fatigue with increased internal rotation . Education on need for wearing night splint at night  performed with patient verbalizing understanding. Patient will continue to benefit from skilled therapy in order to improve dynamic standing balance and increase endurance                 PT Education - 08/09/19 1424    Education provided  Yes    Education Details  exercise technique, upright posture    Person(s) Educated  Patient    Methods  Explanation;Demonstration;Tactile cues;Verbal cues    Comprehension  Verbalized understanding;Returned demonstration;Verbal cues required;Tactile cues required       PT Short Term Goals - 04/07/19 1504      PT SHORT TERM GOAL #1   Title  Patient will be independent in home exercise program to improve strength/mobility for better functional independence with ADLs.    Baseline  hep compliant    Time  2    Period  Weeks    Status  Achieved      PT SHORT TERM GOAL #2   Title  Patient will require min cueing for STS transfer with CGA for increased independence with mobility.     Baseline  CGA    Time  2    Period  Weeks    Status  Achieved      PT SHORT TERM GOAL #3   Title  Patient will maintain upright posture for > 15 seconds to demonstrate strengthened postural control muscualture     Baseline  18 seconds     Time  2    Period  Weeks    Status  Achieved        PT Long Term Goals - 07/28/19 0001      PT LONG TERM GOAL #1   Title  Patient will perform bed mobility (rolling and supine <>sit) with Mod I  to increase mobility and  perform ADLs.     Baseline  4/21: unable to perform     Time  12    Period  Weeks    Status  New    Target Date  10/20/19      PT LONG TERM GOAL #2   Title  Patient (< 65 years old) will complete five times sit to stand test in < 10 seconds indicating an increased LE strength and improved balance.    Baseline  4/21: 18.8 seconds 06/02/19: 19.93 seconds one hand on RW, one hand on w/c 12/30: 16 secpmds BUE support 02/11/19: 36 seconds 5/19: 20.6s from [place], one hand on walker and one on WC, 80%  upright posture 7/1: 22 seconds one hand on walker one hand on w/c  9/14: 48 seconds with full upright posture    Time  12  Period  Weeks    Status  Partially Met    Target Date  10/20/19      PT LONG TERM GOAL #3   Title  Patient will increase Berg Balance score by > 6 points (26/56) to demonstrate decreased fall risk during functional activities.    Baseline  4/21: 32/56 06/02/19: 28/56 12/23: 25/56 9/12: 20/56 10/15: 21/56 11/7: 20/56 12/19: 22/56; 08/26/18: 11/56 7/1: 17/56  9/14: 23/56, 07/06/19=28/56    Time  8    Period  Weeks    Status  Achieved      PT LONG TERM GOAL #4   Title  Patient will increase BLE gross strength to 4+/5 as to improve functional strength for independent gait, increased standing tolerance and increased ADL ability.    Baseline   4/21: see note 06/02/19: hip flexion 4-/5, extension/abd 3/5 12/30: hip flexion 4/5 hip extension and abduction 3/5 02/11/19: hip flexion 4/5 hip extension and abduction 2+/54-/5 RLE, 10/15: 4/5 RLE 11/7: 4/5 RLE 3/3: 4/5 7/1:  hip flexion 4/5 hip extension 2/5 hip abduction/adduciton 2/5     Time  12    Period  Weeks    Status  Partially Met    Target Date  09/22/19      PT LONG TERM GOAL #5   Title  Patient will increase lower extremity functional scale to >40/80 to demonstrate improved functional mobility and increased tolerance with ADLs.     Baseline  4/21: terminated due to cognition 06/02/19: 31/80 12/30: 35/80 02/10/19: 34/56 14/80; 12/26: 21/80; 2/18: 28/80  4/10; 25/80 5/29: 27/80; 7/16: 29/80 8/22: 30/80  9/12: 26/80 (feeling more tired due to moving) 10/15: 37/80 11/7: 23/80 3/3: 32/80; 08/25/18: 28/80 9/15: 32/56    Status  Deferred      PT LONG TERM GOAL #6   Title  Patient will ambulate 200 ft with least assistive device and Supervision with no breaks to allow for increased mobility within home.    Baseline  4/21: ambulate 150 ft     Time  12    Period  Weeks    Status  New    Target Date  10/20/19      PT LONG TERM  GOAL #7   Title  Patient will perform 10 MWT in <30 seconds for improved gait speed, gait mechanics, and functional capacity for mobility.     Baseline   07/28/19: 51 seconds with RW 06/02/19: 62.5 seconds with RW 11/4: 56 seconds with AFO and RW 12/30: 53 seconds with AFO and RW     Time  12    Period  Weeks    Status  Partially Met    Target Date  10/20/19      PT LONG TERM GOAL #8   Title  Patient will increase Berg Balance score by > 6 points (38/56) to demonstrate decreased fall risk during functional activities.    Baseline  07/28/19: 32/56     Time  12    Period  Weeks    Status  New    Target Date  10/20/19            Plan - 08/09/19 1705    Clinical Impression Statement  Patient is challenged with maintaining upright trunk posture with frequent tendency towards forward trunk lean with fatigue. Patient demonstrated increased foot shuffle/decreased foot clearance of R foot with fatigue with increased internal rotation . Education on need for wearing night splint at night performed with patient verbalizing understanding. Patient  will continue to benefit from skilled therapy in order to improve dynamic standing balance and increase endurance    Rehab Potential  Fair    Clinical Impairments Affecting Rehab Potential  hx of HTN, HLD, CVA, learning disability, Diabetes, brain tumor,     PT Frequency  2x / week    PT Duration  12 weeks    PT Treatment/Interventions  ADLs/Self Care Home Management;Aquatic Therapy;Ultrasound;Moist Heat;Traction;DME Instruction;Gait training;Stair training;Functional mobility training;Therapeutic activities;Therapeutic exercise;Orthotic Fit/Training;Neuromuscular re-education;Balance training;Patient/family education;Manual techniques;Wheelchair mobility training;Passive range of motion;Energy conservation;Taping;Visual/perceptual remediation/compensation    PT Next Visit Plan  progress standing balance    PT Home Exercise Plan  TrA contraction, glute sets:  needs updates. Potentially some time in supine flat in hospital bed, to improve hip extension deficits.     Consulted and Agree with Plan of Care  Patient       Patient will benefit from skilled therapeutic intervention in order to improve the following deficits and impairments:  Abnormal gait, Decreased activity tolerance, Decreased balance, Decreased knowledge of precautions, Decreased endurance, Decreased coordination, Decreased knowledge of use of DME, Decreased mobility, Decreased range of motion, Difficulty walking, Decreased safety awareness, Decreased strength, Impaired flexibility, Impaired perceived functional ability, Impaired tone, Postural dysfunction, Improper body mechanics, Pain  Visit Diagnosis: Muscle weakness (generalized)  Other lack of coordination  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 Acute CVA (cerebrovascular accident) ([place]) 05/02/2016  . Left-sided weakness 05/01/2016   Janna Arch, PT, DPT   08/09/2019, 5:07 PM  [place] MAIN [facility] SERVICES [address] [place], [place], [address] Phone: [phone]   Fax:  [phone]  Name: Curtis Powell MRN: [id number] Date of Birth: 04-19-1965

## 2019-08-09 NOTE — Therapy (Signed)
[place] MAIN [facility] SERVICES [address] [place], [place], [address] Phone: [phone]   Fax:  [phone]  Occupational Therapy Treatment  Patient Details  Name: Curtis Powell MRN: [id number] Date of Birth: Dec 25, 1965 No data recorded  Encounter Date: 08/09/2019  OT End of Session - 08/09/19 1444    Visit Number  [id number]    Number of Visits  209    Date for OT Re-Evaluation  10/20/19    Authorization Type  Progress reporting period starting 05/03/2019    OT Start Time  1434    OT Stop Time  1515    OT Time Calculation (min)  41 min    Activity Tolerance  Patient tolerated treatment well    Behavior During Therapy  [facility] for tasks assessed/performed       Past Medical History:  Diagnosis Date  . GERD (gastroesophageal reflux disease)   . Hyperlipidemia   . Hypertension   . Obesity   . Stroke [facility])     Past Surgical History:  Procedure Laterality Date  . BRAIN SURGERY      There were no vitals filed for this visit.  Subjective Assessment - 08/09/19 1443    Subjective   Pt. reports doing well today    Patient is accompanied by:  Family member    Pertinent History  Pt. is a 54 y.o. male who suffered a CVA on 05/01/2016. Pt. was admitted to the hospital. Once discharged, he received Home Health PT and OT services for about a month. Pt. has had multiple CVAs over the past 8 years, and has had multiple falls in the past 6 months. Pt. resides in an apartment. Pt. has caregivers for 80 hours. Pt.'s mother stays with pt. at night, and assists with IADL tasks.      Patient Stated Goals  To be able to throw a ball and dribble a ball, do as much as I can for myself.     Currently in Pain?  No/denies      OT TREATMENT   Selfcare:  Pt. worked on bilateral hand coordination skills needed when making a Mother's Day Card. Pt. worked on bilateral hand coordination for measuring, folding, cutting with scissors, applying stickers after removing them  from label, and writing a message legibly. Pt. Printed a message on the card using large print with 100% legibility.  Pt. required increased time for each part of the card making task. Pt. Required multiple attempts to cut the card to fit the proper envelope size. Pt. was able to print the message  with 100% legibility using large print.  Pt. Continues to work on tasks to improve [facility] skills, and UE functioning in order to improve overall  ADL, and IADL functioning, sewing, ironing, and writing legibly.                      OT Education - 08/09/19 1444    Education provided  Yes    Education Details  HEP, [place], reaching    Person(s) Educated  Patient    Methods  Explanation    Comprehension  Verbalized understanding;Returned demonstration;Verbal cues required       OT Short Term Goals - 03/03/17 1459      OT SHORT TERM GOAL #1   Title  `        OT Long Term Goals - 07/28/19 1504      OT LONG TERM GOAL #4   Title  Pt. will perform self dressing with minA and A/E as needed.    Baseline  Pt. continues to require assist with donning underwear over feet. Min-ModA donning his jacket. (Depending on the jacket)    Time  12    Period  Weeks    Status  Partially Met    Target Date  10/20/19      Long Term Additional Goals   Additional Long Term Goals  Yes      OT LONG TERM GOAL #6   Title  Pt. will write his name efficiently with 100% legibility    Baseline  Pt continues to present with limited with 50% legibility and efficiency when writing name to sign in when riding the [place]    Time  12    Period  Weeks    Status  On-going    Target Date  10/20/19      OT LONG TERM GOAL #7   Title  Pt will independently and consistently follow HEP to increase UE strength to increase functional independence    Baseline  Pt. continues to work towards independence with exercises    Time  12    Period  Weeks    Status  On-going    Target Date  10/20/19      OT LONG TERM GOAL #8    Title  Pt. will require supervision ironing a shirt.    Baseline  Pt. has progressed CGA, and complete set-up seated using a tabletop iron    Time  12    Period  Weeks    Status  On-going    Target Date  10/20/19      OT LONG TERM GOAL  #9   Baseline  Pt. will independently be able to sew a button onto a shirt.    Time  12    Period  Weeks    Status  On-going    Target Date  10/20/19      OT LONG TERM GOAL  #10   TITLE  Pt. will independently be able to throw a ball    Baseline  Pt. continues to be limited with throwing overhand, however pt. is able to perform underhand throwing.    Time  12    Period  Weeks    Status  On-going      OT LONG TERM GOAL  #11   TITLE  Pt. will improve UE functional reaching to be able to independently use his RUE to hand his clothes in the closest.    Baseline  Pt. continues to be able to reach for clothing pulling them off of the hangers. Pt. is unable to reach up to place hangers onto the the rack.    Time  12    Period  Weeks    Status  On-going    Target Date  10/20/19      OT LONG TERM GOAL  #13   TITLE  Pt. will increase BUE strength to be able to prepare for assisting with yardwork.    Baseline  Pt. is unable    Time  12    Period  Weeks    Status  New            Plan - 08/09/19 1445    Clinical Impression Statement  Pt. required increased time for each part of the card making task. Pt. Required multiple attempts to cut the card to fit the proper envelope size. Pt. was able to print the  message  with 100% legibility using large print.  Pt. Continues to work on tasks to improve [facility] skills, and UE functioning in order to improve overall  ADL, and IADL functioning, sewing, ironing, and writing legibly.     OT Occupational Profile and History  Problem Focused Assessment - Including review of records relating to presenting problem    Occupational performance deficits (Please refer to evaluation for details):  ADL's;IADL's    Body  Structure / Function / Physical Skills  ADL;FMC;ROM;IADL;Pain;Coordination    Rehab Potential  Fair    Clinical Decision Making  Several treatment options, min-mod task modification necessary    Comorbidities Affecting Occupational Performance:  May have comorbidities impacting occupational performance    Modification or Assistance to Complete Evaluation   Min-Moderate modification of tasks or assist with assess necessary to complete eval    OT Frequency  1x / week    OT Duration  12 weeks    OT Treatment/Interventions  Self-care/ADL training;Patient/family education;DME and/or AE instruction;Therapeutic exercise;Neuromuscular education    Consulted and Agree with Plan of Care  Patient       Patient will benefit from skilled therapeutic intervention in order to improve the following deficits and impairments:   Body Structure / Function / Physical Skills: ADL, FMC, ROM, IADL, Pain, Coordination       Visit Diagnosis: Muscle weakness (generalized)  Other lack of coordination    Problem List Patient Active Problem List   Diagnosis Date Noted  . Acute CVA (cerebrovascular accident) ([place]) 05/02/2016  . Left-sided weakness 05/01/2016    Harrel Carina, MS, OTR/L 08/09/2019, 2:53 PM  [place] MAIN [facility] SERVICES [address] [place], [place], [address] Phone: [phone]   Fax:  [phone]  Name: KEENON LEITZEL MRN: [id number] Date of Birth: 07-15-1965

## 2019-08-11 ENCOUNTER — Other Ambulatory Visit: Payer: Self-pay

## 2019-08-11 ENCOUNTER — Encounter: Payer: Self-pay | Admitting: Occupational Therapy

## 2019-08-11 ENCOUNTER — Ambulatory Visit: Payer: Medicare HMO | Admitting: Occupational Therapy

## 2019-08-11 ENCOUNTER — Ambulatory Visit: Payer: Medicare HMO

## 2019-08-11 DIAGNOSIS — I69351 Hemiplegia and hemiparesis following cerebral infarction affecting right dominant side: Secondary | ICD-10-CM

## 2019-08-11 DIAGNOSIS — R278 Other lack of coordination: Secondary | ICD-10-CM

## 2019-08-11 DIAGNOSIS — M6281 Muscle weakness (generalized): Secondary | ICD-10-CM

## 2019-08-11 DIAGNOSIS — R2689 Other abnormalities of gait and mobility: Secondary | ICD-10-CM

## 2019-08-11 NOTE — Therapy (Signed)
[place] MAIN [facility] SERVICES [address] [place], [place], [address] Phone: [phone]   Fax:  [phone]  Physical Therapy Treatment Physical Therapy Progress Note   Dates of reporting period  07/06/19   to  08/11/19  Patient Details  Name: Curtis Powell MRN: [id number] Date of Birth: [birth year] No data recorded  Encounter Date: 08/11/2019  PT End of Session - 08/11/19 1352    Visit Number  [id number]    Number of Visits  210    Date for PT Re-Evaluation  10/20/19    Authorization Type  next session 1/10 PN 08/11/19    PT Start Time  1344    PT Stop Time  1429    PT Time Calculation (min)  45 min    Equipment Utilized During Treatment  Gait belt    Activity Tolerance  Patient tolerated treatment well;No increased pain    Behavior During Therapy  WFL for tasks assessed/performed       Past Medical History:  Diagnosis Date  . GERD (gastroesophageal reflux disease)   . Hyperlipidemia   . Hypertension   . Obesity   . Stroke [facility])     Past Surgical History:  Procedure Laterality Date  . BRAIN SURGERY      There were no vitals filed for this visit.  Subjective Assessment - 08/11/19 1349    Subjective  Patient reports no falls or LOB since last session. Going next week to the physician, where he will learn his results from his MRI. Walked this morning with his helper from front of house to the back.    Pertinent History   Patient is a pleasant 54 year old male who presents to physical therapy for weakness and immobility secondary to CVA.  Had a stroke in Feb 2018. He was previously fully independent, but this stroke caused severe residual deficits, mainly on the right side as well as speech, and now he is unable to walk or perform most of his ADLs on his own. Entire right side is very weak. He still has a little difficulty with speech but his swallowing is improved to baseline. His mom had to move in with him and now is his main caregiver.     Limitations  Sitting;Lifting;Standing;Walking;House hold activities    How long can you sit comfortably?  5 minutes    How long can you stand comfortably?  5 minutes    How long can you walk comfortably?  10 ft    Patient Stated Goals  Walk without walker, walk further with walker, get some strength in back     Currently in Pain?  No/denies           Goals performed 2 weeks ago: please refer to notes on 07/28/19 for further details.  Treatment: Nustep Lvl 4 RPM> 70 3 minutes for cardiovascular support and musculoskeletal challenge    Patient ambulates with RW 160 ft with CGA, cues for shift of weight with progression of limb and upright posture with weight shift onto affected limb.  Upon assistance of w/c follow tactile cueing to PSIS and ASIS for optimal rotation and progression. no rest breaks.    Standing with RW:  -no UE support static stand, horizontal head turns 10x, vertical head turns 10x, two near LOB with horizontal head turns -reaching with single UE to grab ball and toss into hoop x 12 each UE for SUE support with pertubation     5lb ankle weight  -  marches 15x each LE -LAQ 10 x 3-5 second hold in full extension;  Single LE at a time  -ER/IR 10x each LE; cues for keeping knee in neutral position, very challenging RLE.     Vitals monitored throughout physical therapy with occasional rest breaks to return to therapeutic range. Able to return quickly with seated rest break.    Pt educated throughout session about proper posture and technique with exercises. Improved exercise technique, movement at target joints, use of target muscles after min to mod verbal, visual, tactile cues    Patient's condition has the potential to improve in response to therapy. Maximum improvement is yet to be obtained. The anticipated improvement is attainable and reasonable in a generally predictable time.  Patient reports he is able to walk more but wants to work on his balance and rolling over  in bed to be more independent as well as be able to walk longer distances.       Goals performed 2 weeks ago: please refer to notes on 07/28/19 for further details. Patient is progressing with improved foot clearance bilaterally. Use of music with ambulation results in increased gait speed with patient keeping time with music. Patient's condition has the potential to improve in response to therapy. Maximum improvement is yet to be obtained. The anticipated improvement is attainable and reasonable in a generally predictable time. Patient will continue to benefit from skilled therapy in order to improve dynamic standing balance and increase endurance                  PT Education - 08/11/19 1342    Education provided  Yes    Education Details  exercise technique, body mechanics    Person(s) Educated  Patient    Methods  Explanation;Demonstration;Tactile cues;Verbal cues    Comprehension  Verbalized understanding;Returned demonstration;Verbal cues required;Tactile cues required       PT Short Term Goals - 04/07/19 1504      PT SHORT TERM GOAL #1   Title  Patient will be independent in home exercise program to improve strength/mobility for better functional independence with ADLs.    Baseline  hep compliant    Time  2    Period  Weeks    Status  Achieved      PT SHORT TERM GOAL #2   Title  Patient will require min cueing for STS transfer with CGA for increased independence with mobility.     Baseline  CGA    Time  2    Period  Weeks    Status  Achieved      PT SHORT TERM GOAL #3   Title  Patient will maintain upright posture for > 15 seconds to demonstrate strengthened postural control muscualture     Baseline  18 seconds     Time  2    Period  Weeks    Status  Achieved        PT Long Term Goals - 07/28/19 0001      PT LONG TERM GOAL #1   Title  Patient will perform bed mobility (rolling and supine <>sit) with Mod I  to increase mobility and  perform ADLs.      Baseline  4/21: unable to perform     Time  12    Period  Weeks    Status  New    Target Date  10/20/19      PT LONG TERM GOAL #2   Title  Patient (< 60  years old) will complete five times sit to stand test in < 10 seconds indicating an increased LE strength and improved balance.    Baseline  4/21: 18.8 seconds 06/02/19: 19.93 seconds one hand on RW, one hand on w/c 12/30: 16 secpmds BUE support 02/11/19: 36 seconds 5/19: 20.6s from [facility], one hand on walker and one on WC, 80% upright posture 7/1: 22 seconds one hand on walker one hand on w/c  9/14: 48 seconds with full upright posture    Time  12    Period  Weeks    Status  Partially Met    Target Date  10/20/19      PT LONG TERM GOAL #3   Title  Patient will increase Berg Balance score by > 6 points (26/56) to demonstrate decreased fall risk during functional activities.    Baseline  4/21: 32/56 06/02/19: 28/56 12/23: 25/56 9/12: 20/56 10/15: 21/56 11/7: 20/56 12/19: 22/56; 08/26/18: 11/56 7/1: 17/56  9/14: 23/56, 07/06/19=28/56    Time  8    Period  Weeks    Status  Achieved      PT LONG TERM GOAL #4   Title  Patient will increase BLE gross strength to 4+/5 as to improve functional strength for independent gait, increased standing tolerance and increased ADL ability.    Baseline   4/21: see note 06/02/19: hip flexion 4-/5, extension/abd 3/5 12/30: hip flexion 4/5 hip extension and abduction 3/5 02/11/19: hip flexion 4/5 hip extension and abduction 2+/54-/5 RLE, 10/15: 4/5 RLE 11/7: 4/5 RLE 3/3: 4/5 7/1:  hip flexion 4/5 hip extension 2/5 hip abduction/adduciton 2/5     Time  12    Period  Weeks    Status  Partially Met    Target Date  09/22/19      PT LONG TERM GOAL #5   Title  Patient will increase lower extremity functional scale to >40/80 to demonstrate improved functional mobility and increased tolerance with ADLs.     Baseline  4/21: terminated due to cognition 06/02/19: 31/80 12/30: 35/80 02/10/19: 34/56 14/80; 12/26: 21/80; 2/18: 28/80   4/10; 25/80 5/29: 27/80; 7/16: 29/80 8/22: 30/80  9/12: 26/80 (feeling more tired due to moving) 10/15: 37/80 11/7: 23/80 3/3: 32/80; 08/25/18: 28/80 9/15: 32/56    Status  Deferred      PT LONG TERM GOAL #6   Title  Patient will ambulate 200 ft with least assistive device and Supervision with no breaks to allow for increased mobility within home.    Baseline  4/21: ambulate 150 ft     Time  12    Period  Weeks    Status  New    Target Date  10/20/19      PT LONG TERM GOAL #7   Title  Patient will perform 10 MWT in <30 seconds for improved gait speed, gait mechanics, and functional capacity for mobility.     Baseline   07/28/19: 51 seconds with RW 06/02/19: 62.5 seconds with RW 11/4: 56 seconds with AFO and RW 12/30: 53 seconds with AFO and RW     Time  12    Period  Weeks    Status  Partially Met    Target Date  10/20/19      PT LONG TERM GOAL #8   Title  Patient will increase Berg Balance score by > 6 points (38/56) to demonstrate decreased fall risk during functional activities.    Baseline  07/28/19: 32/56     Time  12    Period  Weeks    Status  New    Target Date  10/20/19            Plan - 08/11/19 1429    Clinical Impression Statement  Goals performed 2 weeks ago: please refer to notes on 07/28/19 for further details. Patient is progressing with improved foot clearance bilaterally. Use of music with ambulation results in increased gait speed with patient keeping time with music. Patient's condition has the potential to improve in response to therapy. Maximum improvement is yet to be obtained. The anticipated improvement is attainable and reasonable in a generally predictable time. Patient will continue to benefit from skilled therapy in order to improve dynamic standing balance and increase endurance    Rehab Potential  Fair    Clinical Impairments Affecting Rehab Potential  hx of HTN, HLD, CVA, learning disability, Diabetes, brain tumor,     PT Frequency  2x / week    PT  Duration  12 weeks    PT Treatment/Interventions  ADLs/Self Care Home Management;Aquatic Therapy;Ultrasound;Moist Heat;Traction;DME Instruction;Gait training;Stair training;Functional mobility training;Therapeutic activities;Therapeutic exercise;Orthotic Fit/Training;Neuromuscular re-education;Balance training;Patient/family education;Manual techniques;Wheelchair mobility training;Passive range of motion;Energy conservation;Taping;Visual/perceptual remediation/compensation    PT Next Visit Plan  progress standing balance    PT Home Exercise Plan  TrA contraction, glute sets: needs updates. Potentially some time in supine flat in hospital bed, to improve hip extension deficits.     Consulted and Agree with Plan of Care  Patient       Patient will benefit from skilled therapeutic intervention in order to improve the following deficits and impairments:  Abnormal gait, Decreased activity tolerance, Decreased balance, Decreased knowledge of precautions, Decreased endurance, Decreased coordination, Decreased knowledge of use of DME, Decreased mobility, Decreased range of motion, Difficulty walking, Decreased safety awareness, Decreased strength, Impaired flexibility, Impaired perceived functional ability, Impaired tone, Postural dysfunction, Improper body mechanics, Pain  Visit Diagnosis: Muscle weakness (generalized)  Other lack of coordination  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 Acute CVA (cerebrovascular accident) ([place]) 05/02/2016  . Left-sided weakness 05/01/2016   Janna Arch, PT, DPT   08/11/2019, 2:36 PM  [place] MAIN [facility] SERVICES [address] [place], [place], [address] Phone: [phone]   Fax:  [phone]  Name: Curtis Powell MRN: [id number] Date of Birth:  04-Jan-1966

## 2019-08-11 NOTE — Therapy (Signed)
[place] MAIN [facility] SERVICES [address] [place], [place], [address] Phone: [phone]   Fax:  [phone]  Occupational Therapy Treatment  Patient Details  Name: Curtis Powell MRN: [id number] Date of Birth: 1966-01-12 No data recorded  Encounter Date: 08/11/2019  OT End of Session - 08/11/19 1451    Visit Number  [id number]    Number of Visits  209    Date for OT Re-Evaluation  10/20/19    Authorization Type  Progress reporting period starting 05/03/2019    OT Start Time  1433    OT Stop Time  1515    OT Time Calculation (min)  42 min    Equipment Utilized During Treatment  built up pen and adaptive scissors    Activity Tolerance  Patient tolerated treatment well    Behavior During Therapy  WFL for tasks assessed/performed       Past Medical History:  Diagnosis Date  . GERD (gastroesophageal reflux disease)   . Hyperlipidemia   . Hypertension   . Obesity   . Stroke [facility])     Past Surgical History:  Procedure Laterality Date  . BRAIN SURGERY      There were no vitals filed for this visit.  Subjective Assessment - 08/11/19 1449    Subjective   Pt. reports being tired today    Patient is accompanied by:  Family member    Pertinent History  Pt. is a 54 y.o. male who suffered a CVA on 05/01/2016. Pt. was admitted to the hospital. Once discharged, he received Home Health PT and OT services for about a month. Pt. has had multiple CVAs over the past 8 years, and has had multiple falls in the past 6 months. Pt. resides in an apartment. Pt. has caregivers for 80 hours. Pt.'s mother stays with pt. at night, and assists with IADL tasks.      Patient Stated Goals  To be able to throw a ball and dribble a ball, do as much as I can for myself.     Currently in Pain?  No/denies       OT TREATMENT   Selfcare:  Pt. worked on bilateral hand coordination skills while making a Mother's Day card. Pt. worked on bilateral hand coordination skills  needed for using stamps, and a stamp pad, applying stickers to the card after removing them from label with bilateral hand [place], and writing a message legibly on the card, and envelope. Pt. Printed a message on the card using large print with 100% legibility.   Pt. Continues to require increased time for each part of the card making task. Pt. Had difficulty, and required assistance to remove small stickers from the backing. Pt. Required increased to press, and place stickers onto the envelope.  Pt. Required increased time, and assist to place, and slide the card into the envelope.Pt. was able to print the message  with 100% legibility using large print, however required multiple attempts to print the envelope.  Pt. continues to work on tasks to improve [facility] skills, and UE functioning in order to improve overall  ADL, and IADL functioning, sewing, ironing, and writing legibly.                     OT Education - 08/11/19 1450    Education provided  Yes    Education Details  HEP, [place], reaching    Person(s) Educated  Patient    Methods  Explanation  Comprehension  Verbalized understanding;Returned demonstration;Verbal cues required       OT Short Term Goals - 03/03/17 1459      OT SHORT TERM GOAL #1   Title  `        OT Long Term Goals - 07/28/19 1504      OT LONG TERM GOAL #4   Title  Pt. will perform self dressing with minA and A/E as needed.    Baseline  Pt. continues to require assist with donning underwear over feet. Min-ModA donning his jacket. (Depending on the jacket)    Time  12    Period  Weeks    Status  Partially Met    Target Date  10/20/19      Long Term Additional Goals   Additional Long Term Goals  Yes      OT LONG TERM GOAL #6   Title  Pt. will write his name efficiently with 100% legibility    Baseline  Pt continues to present with limited with 50% legibility and efficiency when writing name to sign in when riding the [place]    Time  12    Period   Weeks    Status  On-going    Target Date  10/20/19      OT LONG TERM GOAL #7   Title  Pt will independently and consistently follow HEP to increase UE strength to increase functional independence    Baseline  Pt. continues to work towards independence with exercises    Time  12    Period  Weeks    Status  On-going    Target Date  10/20/19      OT LONG TERM GOAL #8   Title  Pt. will require supervision ironing a shirt.    Baseline  Pt. has progressed CGA, and complete set-up seated using a tabletop iron    Time  12    Period  Weeks    Status  On-going    Target Date  10/20/19      OT LONG TERM GOAL  #9   Baseline  Pt. will independently be able to sew a button onto a shirt.    Time  12    Period  Weeks    Status  On-going    Target Date  10/20/19      OT LONG TERM GOAL  #10   TITLE  Pt. will independently be able to throw a ball    Baseline  Pt. continues to be limited with throwing overhand, however pt. is able to perform underhand throwing.    Time  12    Period  Weeks    Status  On-going      OT LONG TERM GOAL  #11   TITLE  Pt. will improve UE functional reaching to be able to independently use his RUE to hand his clothes in the closest.    Baseline  Pt. continues to be able to reach for clothing pulling them off of the hangers. Pt. is unable to reach up to place hangers onto the the rack.    Time  12    Period  Weeks    Status  On-going    Target Date  10/20/19      OT LONG TERM GOAL  #13   TITLE  Pt. will increase BUE strength to be able to prepare for assisting with yardwork.    Baseline  Pt. is unable    Time  12  Period  Weeks    Status  New            Plan - 08/11/19 1452    Clinical Impression Statement  Pt. Continues to require increased time for each part of the card making task. Pt. Had difficulty, and required assistance to remove small stickers from the backing. Pt. Required increased to press, and place stickers onto the envelope.  Pt.  Required increased time, and assist to place, and slide the card into the envelope.Pt. was able to print the message  with 100% legibility using large print, however required multiple attempts to print the envelope.  Pt. continues to work on tasks to improve [facility] skills, and UE functioning in order to improve overall  ADL, and IADL functioning, sewing, ironing, and writing legibly.   OT Occupational Profile and History  Problem Focused Assessment - Including review of records relating to presenting problem    Occupational performance deficits (Please refer to evaluation for details):  ADL's;IADL's    Body Structure / Function / Physical Skills  ADL;FMC;ROM;IADL;Pain;Coordination    Rehab Potential  Fair    Clinical Decision Making  Several treatment options, min-mod task modification necessary    Comorbidities Affecting Occupational Performance:  May have comorbidities impacting occupational performance    Modification or Assistance to Complete Evaluation   Min-Moderate modification of tasks or assist with assess necessary to complete eval    OT Frequency  1x / week    OT Duration  12 weeks    OT Treatment/Interventions  Self-care/ADL training;Patient/family education;DME and/or AE instruction;Therapeutic exercise;Neuromuscular education    Consulted and Agree with Plan of Care  Patient       Patient will benefit from skilled therapeutic intervention in order to improve the following deficits and impairments:   Body Structure / Function / Physical Skills: ADL, FMC, ROM, IADL, Pain, Coordination       Visit Diagnosis: Muscle weakness (generalized)  Other lack of coordination    Problem List Patient Active Problem List   Diagnosis Date Noted  . Acute CVA (cerebrovascular accident) ([place]) 05/02/2016  . Left-sided weakness 05/01/2016    Harrel Carina, MS, OTR/L 08/11/2019, 2:57 PM  [place] MAIN [facility] SERVICES [address] [place],  [place], [address] Phone: [phone]   Fax:  [phone]  Name: Curtis Powell MRN: [id number] Date of Birth: Feb 08, 1966

## 2019-08-16 ENCOUNTER — Ambulatory Visit: Payer: Medicare HMO

## 2019-08-16 ENCOUNTER — Encounter: Payer: Medicare HMO | Admitting: Occupational Therapy

## 2019-08-17 ENCOUNTER — Other Ambulatory Visit: Payer: Self-pay | Admitting: Neurology

## 2019-08-17 DIAGNOSIS — D496 Neoplasm of unspecified behavior of brain: Secondary | ICD-10-CM

## 2019-08-18 ENCOUNTER — Encounter: Payer: Self-pay | Admitting: Physical Therapy

## 2019-08-18 ENCOUNTER — Ambulatory Visit: Payer: Medicare HMO | Admitting: Occupational Therapy

## 2019-08-18 ENCOUNTER — Ambulatory Visit: Payer: Medicare HMO | Admitting: Physical Therapy

## 2019-08-18 ENCOUNTER — Encounter: Payer: Self-pay | Admitting: Occupational Therapy

## 2019-08-18 ENCOUNTER — Other Ambulatory Visit: Payer: Self-pay

## 2019-08-18 DIAGNOSIS — M6281 Muscle weakness (generalized): Secondary | ICD-10-CM

## 2019-08-18 DIAGNOSIS — I69351 Hemiplegia and hemiparesis following cerebral infarction affecting right dominant side: Secondary | ICD-10-CM

## 2019-08-18 DIAGNOSIS — I639 Cerebral infarction, unspecified: Secondary | ICD-10-CM

## 2019-08-18 DIAGNOSIS — R2689 Other abnormalities of gait and mobility: Secondary | ICD-10-CM

## 2019-08-18 DIAGNOSIS — R531 Weakness: Secondary | ICD-10-CM

## 2019-08-18 DIAGNOSIS — R278 Other lack of coordination: Secondary | ICD-10-CM

## 2019-08-18 NOTE — Therapy (Addendum)
[place] MAIN [facility] SERVICES [address] [place], [place], [address] Phone: [phone]   Fax:  [phone]  Occupational Therapy Treatment  Patient Details  Name: Curtis Powell MRN: [id number] Date of Birth: [birth year] No data recorded  Encounter Date: 08/18/2019  OT End of Session - 08/18/19 1436    Visit Number  [id number]    Number of Visits  209    Date for OT Re-Evaluation  10/20/19    Authorization Type  Progress reporting period starting 08/02/2019    OT Start Time  1430    OT Stop Time  1515    OT Time Calculation (min)  45 min    Equipment Utilized During Treatment  built up pen and adaptive scissors    Activity Tolerance  Patient tolerated treatment well    Behavior During Therapy  WFL for tasks assessed/performed       Past Medical History:  Diagnosis Date  . GERD (gastroesophageal reflux disease)   . Hyperlipidemia   . Hypertension   . Obesity   . Stroke [facility])     Past Surgical History:  Procedure Laterality Date  . BRAIN SURGERY      There were no vitals filed for this visit.  Subjective Assessment - 08/18/19 1435    Subjective   Pt. Reports doing well today.    Patient is accompanied by:  Family member    Pertinent History  Pt. is a 54 y.o. male who suffered a CVA on 05/01/2016. Pt. was admitted to the hospital. Once discharged, he received Home Health PT and OT services for about a month. Pt. has had multiple CVAs over the past 8 years, and has had multiple falls in the past 6 months. Pt. resides in an apartment. Pt. has caregivers for 80 hours. Pt.'s mother stays with pt. at night, and assists with IADL tasks.      Patient Stated Goals  To be able to throw a ball and dribble a ball, do as much as I can for myself.     Currently in Pain?  No/denies      OT Treatment  Neuromuscular re-ed:  Pt. worked on reaching using the [employer]. Pt. Grasped and moved the shapes  through 3 vertical dowels of varying heights  using his right hand. Pt. worked on using his right hand for grasping, and manipulating 1", 3/4", and 1/2" washers from a magnetic dishgrasping with 2pt.pointpinch. Pt. worked on translatory movements of the right hand moving the objects through his hand from his palm to the tip of his 2nd digit, and thumb, then reaching up to place them onto magnetic hooks positioned on an elevated surface above the tabletop.  Response to Treatment:  Pt. has an MD appointment to follow-up from his Brain MRI. Pt. needs minimal cueing to avoid compensating proximally with hiking the right shoulder, and leaning to the left, or right when reaching.Pt. was able to maintain his trunk in midline during most of the task, with no compensation proximally.Pt. continues to require assist manipulatingsmall objects, storing them in his hand, and performing translatory movements.Pt. Dropped multiple washers from his hand today.Fewer rest breaks were required today.Pt. continues to work on improving RUE strength, and [facility] skills in order to work towards improving RUE functioning during ADLs, and IADL tasks.Plan to focus on [facility] skills, and improve writing legibility.                      OT  Education - 08/18/19 1435    Education provided  Yes    Education Details  HEP, [place], reaching    Person(s) Educated  Patient    Methods  Explanation    Comprehension  Verbalized understanding;Returned demonstration;Verbal cues required       OT Short Term Goals - 03/03/17 1459      OT SHORT TERM GOAL #1   Title  `        OT Long Term Goals - 07/28/19 1504      OT LONG TERM GOAL #4   Title  Pt. will perform self dressing with minA and A/E as needed.    Baseline  Pt. continues to require assist with donning underwear over feet. Min-ModA donning his jacket. (Depending on the jacket)    Time  12    Period  Weeks    Status  Partially Met    Target Date  10/20/19      Long Term Additional Goals    Additional Long Term Goals  Yes      OT LONG TERM GOAL #6   Title  Pt. will write his name efficiently with 100% legibility    Baseline  Pt continues to present with limited with 50% legibility and efficiency when writing name to sign in when riding the [place]    Time  12    Period  Weeks    Status  On-going    Target Date  10/20/19      OT LONG TERM GOAL #7   Title  Pt will independently and consistently follow HEP to increase UE strength to increase functional independence    Baseline  Pt. continues to work towards independence with exercises    Time  12    Period  Weeks    Status  On-going    Target Date  10/20/19      OT LONG TERM GOAL #8   Title  Pt. will require supervision ironing a shirt.    Baseline  Pt. has progressed CGA, and complete set-up seated using a tabletop iron    Time  12    Period  Weeks    Status  On-going    Target Date  10/20/19      OT LONG TERM GOAL  #9   Baseline  Pt. will independently be able to sew a button onto a shirt.    Time  12    Period  Weeks    Status  On-going    Target Date  10/20/19      OT LONG TERM GOAL  #10   TITLE  Pt. will independently be able to throw a ball    Baseline  Pt. continues to be limited with throwing overhand, however pt. is able to perform underhand throwing.    Time  12    Period  Weeks    Status  On-going      OT LONG TERM GOAL  #11   TITLE  Pt. will improve UE functional reaching to be able to independently use his RUE to hand his clothes in the closest.    Baseline  Pt. continues to be able to reach for clothing pulling them off of the hangers. Pt. is unable to reach up to place hangers onto the the rack.    Time  12    Period  Weeks    Status  On-going    Target Date  10/20/19      OT LONG TERM GOAL  #  13   TITLE  Pt. will increase BUE strength to be able to prepare for assisting with yardwork.    Baseline  Pt. is unable    Time  12    Period  Weeks    Status  New            Plan - 08/18/19  1438    Clinical Impression Statement  Pt. has an MD appointment to follow-up from his Brain MRI. Pt. needs minimal cueing to avoid compensating proximally with hiking the right shoulder, and leaning to the left, or right when reaching.Pt. was able to maintain his trunk in midline during most of the task, with no compensation proximally.Pt. continues to require assist manipulatingsmall objects, storing them in his hand, and performing translatory movements.Pt. Dropped multiple washers from his hand today.Fewer rest breaks were required today.Pt. continues to work on improving RUE strength, and [facility] skills in order to work towards improving RUE functioning during ADLs, and IADL tasks.Plan to focus on [facility] skills, and improve writing legibility.    OT Occupational Profile and History  Problem Focused Assessment - Including review of records relating to presenting problem    Occupational performance deficits (Please refer to evaluation for details):  ADL's;IADL's    Body Structure / Function / Physical Skills  ADL;FMC;ROM;IADL;Pain;Coordination    Rehab Potential  Fair    Clinical Decision Making  Several treatment options, min-mod task modification necessary    Comorbidities Affecting Occupational Performance:  May have comorbidities impacting occupational performance    Modification or Assistance to Complete Evaluation   Min-Moderate modification of tasks or assist with assess necessary to complete eval    OT Frequency  1x / week    OT Duration  12 weeks    OT Treatment/Interventions  Self-care/ADL training;Patient/family education;DME and/or AE instruction;Therapeutic exercise;Neuromuscular education    Consulted and Agree with Plan of Care  Patient       Patient will benefit from skilled therapeutic intervention in order to improve the following deficits and impairments:   Body Structure / Function / Physical Skills: ADL, FMC, ROM, IADL, Pain, Coordination       Visit Diagnosis: Muscle  weakness (generalized)  Other lack of coordination    Problem List Patient Active Problem List   Diagnosis Date Noted  . Acute CVA (cerebrovascular accident) ([place]) 05/02/2016  . Left-sided weakness 05/01/2016    Harrel Carina, OTR/L 08/18/2019, 2:41 PM  [place] MAIN [facility] SERVICES [address] [place], [place], [address] Phone: [phone]   Fax:  [phone]  Name: JOMO FORAND MRN: [id number] Date of Birth: 02-Oct-1965

## 2019-08-18 NOTE — Therapy (Signed)
[place] MAIN [facility] SERVICES [address] [place], [place], [address] Phone: [phone]   Fax:  [phone]  Physical Therapy Treatment  Patient Details  Name: Curtis Powell MRN: [id number] Date of Birth: 21-Mar-1966 No data recorded  Encounter Date: 08/18/2019  PT End of Session - 08/18/19 1428    Visit Number  [id number]    Number of Visits  210    Date for PT Re-Evaluation  10/20/19    Authorization Type  next session 1/10 PN 08/11/19    PT Start Time  0150    PT Stop Time  0230    PT Time Calculation (min)  40 min    Equipment Utilized During Treatment  Gait belt    Activity Tolerance  Patient tolerated treatment well;No increased pain    Behavior During Therapy  WFL for tasks assessed/performed       Past Medical History:  Diagnosis Date  . GERD (gastroesophageal reflux disease)   . Hyperlipidemia   . Hypertension   . Obesity   . Stroke [facility])     Past Surgical History:  Procedure Laterality Date  . BRAIN SURGERY      There were no vitals filed for this visit.  Subjective Assessment - 08/18/19 1427    Subjective  Patient reports no falls or LOB since last session.. .    Pertinent History   Patient is a pleasant 54 year old male who presents to physical therapy for weakness and immobility secondary to CVA.  Had a stroke in Feb 2018. He was previously fully independent, but this stroke caused severe residual deficits, mainly on the right side as well as speech, and now he is unable to walk or perform most of his ADLs on his own. Entire right side is very weak. He still has a little difficulty with speech but his swallowing is improved to baseline. His mom had to move in with him and now is his main caregiver.     Limitations  Sitting;Lifting;Standing;Walking;House hold activities    How long can you sit comfortably?  5 minutes    How long can you stand comfortably?  5 minutes    How long can you walk comfortably?  10 ft    Patient  Stated Goals  Walk without walker, walk further with walker, get some strength in back     Currently in Pain?  No/denies    Pain Score  0-No pain    Pain Onset  More than a month ago       Patient ambulates with QX450 feet x 2 with 1 seated rest period and RW and lateral sway with decreased knee flex and hip flex. Therapeutic exercises Nu-step x 5 mins , L 3 Seated LAQ with4lbs x 20x 3 sets BLE Seated marching with4lbs x 20 x 2 BLE Pt educated throughout session about proper posture and technique with exercises. Improved exercise technique, movement at target joints, use of target muscles after min to mod verbal, visual, tactile cues. Patient performed with instruction, verbal cues, tactile cues of therapist: goal:increase tissue extensibility, promote proper posture, improve mobility                         PT Education - 08/18/19 1428    Education provided  Yes    Education Details  HEP    Person(s) Educated  Patient    Methods  Explanation;Verbal cues    Comprehension  Returned demonstration;Need  further instruction       PT Short Term Goals - 04/07/19 1504      PT SHORT TERM GOAL #1   Title  Patient will be independent in home exercise program to improve strength/mobility for better functional independence with ADLs.    Baseline  hep compliant    Time  2    Period  Weeks    Status  Achieved      PT SHORT TERM GOAL #2   Title  Patient will require min cueing for STS transfer with CGA for increased independence with mobility.     Baseline  CGA    Time  2    Period  Weeks    Status  Achieved      PT SHORT TERM GOAL #3   Title  Patient will maintain upright posture for > 15 seconds to demonstrate strengthened postural control muscualture     Baseline  18 seconds     Time  2    Period  Weeks    Status  Achieved        PT Long Term Goals - 07/28/19 0001      PT LONG TERM GOAL #1   Title  Patient will perform bed mobility (rolling and  supine <>sit) with Mod I  to increase mobility and  perform ADLs.     Baseline  4/21: unable to perform     Time  12    Period  Weeks    Status  New    Target Date  10/20/19      PT LONG TERM GOAL #2   Title  Patient (< 36 years old) will complete five times sit to stand test in < 10 seconds indicating an increased LE strength and improved balance.    Baseline  4/21: 18.8 seconds 06/02/19: 19.93 seconds one hand on RW, one hand on w/c 12/30: 16 secpmds BUE support 02/11/19: 36 seconds 5/19: 20.6s from [facility] [address], one hand on walker and one on WC, 80% upright posture 7/1: 22 seconds one hand on walker one hand on w/c  9/14: 48 seconds with full upright posture    Time  12    Period  Weeks    Status  Partially Met    Target Date  10/20/19      PT LONG TERM GOAL #3   Title  Patient will increase Berg Balance score by > 6 points (26/56) to demonstrate decreased fall risk during functional activities.    Baseline  4/21: 32/56 06/02/19: 28/56 12/23: 25/56 9/12: 20/56 10/15: 21/56 11/7: 20/56 12/19: 22/56; 08/26/18: 11/56 7/1: 17/56  9/14: 23/56, 07/06/19=28/56    Time  8    Period  Weeks    Status  Achieved      PT LONG TERM GOAL #4   Title  Patient will increase BLE gross strength to 4+/5 as to improve functional strength for independent gait, increased standing tolerance and increased ADL ability.    Baseline   4/21: see note 06/02/19: hip flexion 4-/5, extension/abd 3/5 12/30: hip flexion 4/5 hip extension and abduction 3/5 02/11/19: hip flexion 4/5 hip extension and abduction 2+/54-/5 RLE, 10/15: 4/5 RLE 11/7: 4/5 RLE 3/3: 4/5 7/1:  hip flexion 4/5 hip extension 2/5 hip abduction/adduciton 2/5     Time  12    Period  Weeks    Status  Partially Met    Target Date  09/22/19      PT LONG TERM GOAL #5   Title  Patient will increase lower extremity functional scale to >40/80 to demonstrate improved functional mobility and increased tolerance with ADLs.     Baseline  4/21: terminated due to cognition  06/02/19: 31/80 12/30: 35/80 02/10/19: 34/56 14/80; 12/26: 21/80; 2/18: 28/80  4/10; 25/80 5/29: 27/80; 7/16: 29/80 8/22: 30/80  9/12: 26/80 (feeling more tired due to moving) 10/15: 37/80 11/7: 23/80 3/3: 32/80; 08/25/18: 28/80 9/15: 32/56    Status  Deferred      PT LONG TERM GOAL #6   Title  Patient will ambulate 200 ft with least assistive device and Supervision with no breaks to allow for increased mobility within home.    Baseline  4/21: ambulate 150 ft     Time  12    Period  Weeks    Status  New    Target Date  10/20/19      PT LONG TERM GOAL #7   Title  Patient will perform 10 MWT in <30 seconds for improved gait speed, gait mechanics, and functional capacity for mobility.     Baseline   07/28/19: 51 seconds with RW 06/02/19: 62.5 seconds with RW 11/4: 56 seconds with AFO and RW 12/30: 53 seconds with AFO and RW     Time  12    Period  Weeks    Status  Partially Met    Target Date  10/20/19      PT LONG TERM GOAL #8   Title  Patient will increase Berg Balance score by > 6 points (38/56) to demonstrate decreased fall risk during functional activities.    Baseline  07/28/19: 32/56     Time  12    Period  Weeks    Status  New    Target Date  10/20/19            Plan - 08/18/19 1429    Clinical Impression Statement   Pt was able to perform all exercises today with CGA.Marland Kitchen Pt was able to perform gait training  and strength exercises, demonstrating improvements in LE strength and stability.   Pt requires verbal, visual and tactile cues during exercise in order to complete tasks with proper form and technique, .  Pt would continue to benefit from skilled PT services in order to further strengthen LE's, improve static and dynamic balance, and improve coordination in order to increase functional mobility and decrease risk of falls   Rehab Potential  Fair    Clinical Impairments Affecting Rehab Potential  hx of HTN, HLD, CVA, learning disability, Diabetes, brain tumor,     PT Frequency   2x / week    PT Duration  12 weeks    PT Treatment/Interventions  ADLs/Self Care Home Management;Aquatic Therapy;Ultrasound;Moist Heat;Traction;DME Instruction;Gait training;Stair training;Functional mobility training;Therapeutic activities;Therapeutic exercise;Orthotic Fit/Training;Neuromuscular re-education;Balance training;Patient/family education;Manual techniques;Wheelchair mobility training;Passive range of motion;Energy conservation;Taping;Visual/perceptual remediation/compensation    PT Next Visit Plan  progress standing balance    PT Home Exercise Plan  TrA contraction, glute sets: needs updates. Potentially some time in supine flat in hospital bed, to improve hip extension deficits.     Consulted and Agree with Plan of Care  Patient       Patient will benefit from skilled therapeutic intervention in order to improve the following deficits and impairments:  Abnormal gait, Decreased activity tolerance, Decreased balance, Decreased knowledge of precautions, Decreased endurance, Decreased coordination, Decreased knowledge of use of DME, Decreased mobility, Decreased range of motion, Difficulty walking, Decreased safety awareness, Decreased strength, Impaired flexibility, Impaired perceived functional  ability, Impaired tone, Postural dysfunction, Improper body mechanics, Pain  Visit Diagnosis: Muscle weakness (generalized)  Other lack of coordination  Hemiplegia and hemiparesis following cerebral infarction affecting right dominant side (HCC)  Acute CVA (cerebrovascular accident) ([place])  Left-sided weakness  Other abnormalities of gait and mobility     Problem List Patient Active Problem List   Diagnosis Date Noted  . Acute CVA (cerebrovascular accident) ([place]) 05/02/2016  . Left-sided weakness 05/01/2016    Alanson Puls, [place] DPT 08/18/2019, 2:48 PM  [place] MAIN [facility] SERVICES [address] [place], [place], [address] Phone:  [phone]   Fax:  [phone]  Name: Curtis Powell MRN: [id number] Date of Birth: Jul 17, 1965

## 2019-08-23 ENCOUNTER — Other Ambulatory Visit: Payer: Self-pay

## 2019-08-23 ENCOUNTER — Encounter: Payer: Self-pay | Admitting: Occupational Therapy

## 2019-08-23 ENCOUNTER — Ambulatory Visit: Payer: Medicare HMO

## 2019-08-23 ENCOUNTER — Ambulatory Visit: Payer: Medicare HMO | Admitting: Occupational Therapy

## 2019-08-23 DIAGNOSIS — M6281 Muscle weakness (generalized): Secondary | ICD-10-CM | POA: Diagnosis not present

## 2019-08-23 DIAGNOSIS — R2689 Other abnormalities of gait and mobility: Secondary | ICD-10-CM

## 2019-08-23 DIAGNOSIS — I69351 Hemiplegia and hemiparesis following cerebral infarction affecting right dominant side: Secondary | ICD-10-CM

## 2019-08-23 DIAGNOSIS — R278 Other lack of coordination: Secondary | ICD-10-CM

## 2019-08-23 NOTE — Therapy (Signed)
[place] MAIN [facility] SERVICES [address] [place], [place], [address] Phone: [phone]   Fax:  [phone]  Physical Therapy Treatment  Patient Details  Name: Curtis Powell MRN: [id number] Date of Birth: [birth year] No data recorded  Encounter Date: 08/23/2019  PT End of Session - 08/23/19 1417    Visit Number  [id number]    Number of Visits  210    Date for PT Re-Evaluation  10/20/19    Authorization Type  2/10 PN 08/11/19    PT Start Time  1347    PT Stop Time  1430    PT Time Calculation (min)  43 min    Equipment Utilized During Treatment  Gait belt    Activity Tolerance  Patient tolerated treatment well;No increased pain    Behavior During Therapy  WFL for tasks assessed/performed       Past Medical History:  Diagnosis Date  . GERD (gastroesophageal reflux disease)   . Hyperlipidemia   . Hypertension   . Obesity   . Stroke [facility])     Past Surgical History:  Procedure Laterality Date  . BRAIN SURGERY      There were no vitals filed for this visit.  Subjective Assessment - 08/23/19 1415    Subjective  Patient reports no falls or LOB since last session. Walked this morning with his aide.    Pertinent History   Patient is a pleasant 54 year old male who presents to physical therapy for weakness and immobility secondary to CVA.  Had a stroke in Feb 2018. He was previously fully independent, but this stroke caused severe residual deficits, mainly on the right side as well as speech, and now he is unable to walk or perform most of his ADLs on his own. Entire right side is very weak. He still has a little difficulty with speech but his swallowing is improved to baseline. His mom had to move in with him and now is his main caregiver.     Limitations  Sitting;Lifting;Standing;Walking;House hold activities    How long can you sit comfortably?  5 minutes    How long can you stand comfortably?  5 minutes    How long can you walk comfortably?   10 ft    Patient Stated Goals  Walk without walker, walk further with walker, get some strength in back     Currently in Pain?  No/denies         Treatment: In // bars: close CGA with cueing for stability - forward ambulation with cues for uprigh tposture, heel strike, and step length; then backwards ambulation length back to chair 3x (6 lengths total)  -static stand 60 seconds -static stand weight shift/"beebop" left right x 60 seconds -2" step toe taps BUE support cues for hip/knee flexion for clearance. Target negotiation 15x each LE  -lateral stepping 2x length of //bars, very fatiguing to patient.   Standing with # 4 ankle weight: CGA for stability  -Hip extension with bilateral upper extremity support, cueing for neutral hip alignment, upright posture for optimal muscle recruitment, and sequencing, 10x each LE,  -Hip flexion with bilateral upper extremity support, cueing for body mechanics, speed of muscle recruitment for optimal strengthening and stabilization 10x each LE    Seated with # 4 ankle weights  -Seated marches with upright posture, back away from back of chair for abdominal/trunk activation/stabilization, 10x each LE -Seated LAQ with 3 second holds, 10x each LE, cueing for muscle activation  and sequencing for neutral alignment -Seated IR/ER with cueing for stabilizing knee placement with lateral foot movement for optimal muscle recruitment, 10x each LE  Patient requires seated rest breaks between standing interventions due to fatigue, shortness of breath, and elevated HR with exercise.     Patient presents with excellent motivation throughout physical therapy session. Patient is very fatigued by prolonged standing interventions in // bars requiring rest breaks for reduction of HR to therapeutic range, fatigue, and shortness of breathe. Increased stability without UE support progressed with patient tolerating weight shifts but not leg lifts. Patient will continue to  benefit from skilled therapy in order to improve dynamic standing balance and increase endurance                   PT Education - 08/23/19 1416    Education provided  Yes    Education Details  exercise technique, standing stability    Person(s) Educated  Patient    Methods  Explanation;Demonstration;Tactile cues;Verbal cues    Comprehension  Verbalized understanding;Returned demonstration;Verbal cues required;Tactile cues required       PT Short Term Goals - 04/07/19 1504      PT SHORT TERM GOAL #1   Title  Patient will be independent in home exercise program to improve strength/mobility for better functional independence with ADLs.    Baseline  hep compliant    Time  2    Period  Weeks    Status  Achieved      PT SHORT TERM GOAL #2   Title  Patient will require min cueing for STS transfer with CGA for increased independence with mobility.     Baseline  CGA    Time  2    Period  Weeks    Status  Achieved      PT SHORT TERM GOAL #3   Title  Patient will maintain upright posture for > 15 seconds to demonstrate strengthened postural control muscualture     Baseline  18 seconds     Time  2    Period  Weeks    Status  Achieved        PT Long Term Goals - 07/28/19 0001      PT LONG TERM GOAL #1   Title  Patient will perform bed mobility (rolling and supine <>sit) with Mod I  to increase mobility and  perform ADLs.     Baseline  4/21: unable to perform     Time  12    Period  Weeks    Status  New    Target Date  10/20/19      PT LONG TERM GOAL #2   Title  Patient (< 86 years old) will complete five times sit to stand test in < 10 seconds indicating an increased LE strength and improved balance.    Baseline  4/21: 18.8 seconds 06/02/19: 19.93 seconds one hand on RW, one hand on w/c 12/30: 16 secpmds BUE support 02/11/19: 36 seconds 5/19: 20.6s from [facility], one hand on walker and one on WC, 80% upright posture 7/1: 22 seconds one hand on walker one hand on w/c   9/14: 48 seconds with full upright posture    Time  12    Period  Weeks    Status  Partially Met    Target Date  10/20/19      PT LONG TERM GOAL #3   Title  Patient will increase Berg Balance score by > 6 points (26/56) to  demonstrate decreased fall risk during functional activities.    Baseline  4/21: 32/56 06/02/19: 28/56 12/23: 25/56 9/12: 20/56 10/15: 21/56 11/7: 20/56 12/19: 22/56; 08/26/18: 11/56 7/1: 17/56  9/14: 23/56, 07/06/19=28/56    Time  8    Period  Weeks    Status  Achieved      PT LONG TERM GOAL #4   Title  Patient will increase BLE gross strength to 4+/5 as to improve functional strength for independent gait, increased standing tolerance and increased ADL ability.    Baseline   4/21: see note 06/02/19: hip flexion 4-/5, extension/abd 3/5 12/30: hip flexion 4/5 hip extension and abduction 3/5 02/11/19: hip flexion 4/5 hip extension and abduction 2+/54-/5 RLE, 10/15: 4/5 RLE 11/7: 4/5 RLE 3/3: 4/5 7/1:  hip flexion 4/5 hip extension 2/5 hip abduction/adduciton 2/5     Time  12    Period  Weeks    Status  Partially Met    Target Date  09/22/19      PT LONG TERM GOAL #5   Title  Patient will increase lower extremity functional scale to >40/80 to demonstrate improved functional mobility and increased tolerance with ADLs.     Baseline  4/21: terminated due to cognition 06/02/19: 31/80 12/30: 35/80 02/10/19: 34/56 14/80; 12/26: 21/80; 2/18: 28/80  4/10; 25/80 5/29: 27/80; 7/16: 29/80 8/22: 30/80  9/12: 26/80 (feeling more tired due to moving) 10/15: 37/80 11/7: 23/80 3/3: 32/80; 08/25/18: 28/80 9/15: 32/56    Status  Deferred      PT LONG TERM GOAL #6   Title  Patient will ambulate 200 ft with least assistive device and Supervision with no breaks to allow for increased mobility within home.    Baseline  4/21: ambulate 150 ft     Time  12    Period  Weeks    Status  New    Target Date  10/20/19      PT LONG TERM GOAL #7   Title  Patient will perform 10 MWT in <30 seconds for  improved gait speed, gait mechanics, and functional capacity for mobility.     Baseline   07/28/19: 51 seconds with RW 06/02/19: 62.5 seconds with RW 11/4: 56 seconds with AFO and RW 12/30: 53 seconds with AFO and RW     Time  12    Period  Weeks    Status  Partially Met    Target Date  10/20/19      PT LONG TERM GOAL #8   Title  Patient will increase Berg Balance score by > 6 points (38/56) to demonstrate decreased fall risk during functional activities.    Baseline  07/28/19: 32/56     Time  12    Period  Weeks    Status  New    Target Date  10/20/19            Plan - 08/23/19 1426    Clinical Impression Statement  Patient presents with excellent motivation throughout physical therapy session. Patient is very fatigued by prolonged standing interventions in // bars requiring rest breaks for reduction of HR to therapeutic range, fatigue, and shortness of breathe. Increased stability without UE support progressed with patient tolerating weight shifts but not leg lifts. Patient will continue to benefit from skilled therapy in order to improve dynamic standing balance and increase endurance    Rehab Potential  Fair    Clinical Impairments Affecting Rehab Potential  hx of HTN, HLD, CVA, learning disability, Diabetes,  brain tumor,     PT Frequency  2x / week    PT Duration  12 weeks    PT Treatment/Interventions  ADLs/Self Care Home Management;Aquatic Therapy;Ultrasound;Moist Heat;Traction;DME Instruction;Gait training;Stair training;Functional mobility training;Therapeutic activities;Therapeutic exercise;Orthotic Fit/Training;Neuromuscular re-education;Balance training;Patient/family education;Manual techniques;Wheelchair mobility training;Passive range of motion;Energy conservation;Taping;Visual/perceptual remediation/compensation    PT Next Visit Plan  progress standing balance    PT Home Exercise Plan  TrA contraction, glute sets: needs updates. Potentially some time in supine flat in  hospital bed, to improve hip extension deficits.     Consulted and Agree with Plan of Care  Patient       Patient will benefit from skilled therapeutic intervention in order to improve the following deficits and impairments:  Abnormal gait, Decreased activity tolerance, Decreased balance, Decreased knowledge of precautions, Decreased endurance, Decreased coordination, Decreased knowledge of use of DME, Decreased mobility, Decreased range of motion, Difficulty walking, Decreased safety awareness, Decreased strength, Impaired flexibility, Impaired perceived functional ability, Impaired tone, Postural dysfunction, Improper body mechanics, Pain  Visit Diagnosis: Muscle weakness (generalized)  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 Acute CVA (cerebrovascular accident) ([place]) 05/02/2016  . Left-sided weakness 05/01/2016   Janna Arch, PT, DPT   08/23/2019, 3:12 PM  [place] MAIN [facility] SERVICES [address] [place], [place], [address] Phone: [phone]   Fax:  [phone]  Name: MART COLPITTS MRN: [id number] Date of Birth: [birth year]

## 2019-08-23 NOTE — Therapy (Addendum)
[place] MAIN [facility] SERVICES [address] [place], [place], [address] Phone: [phone]   Fax:  [phone]  Occupational Therapy Treatment  Patient Details  Name: Curtis Powell MRN: [id number] Date of Birth: 26-Feb-1966 No data recorded  Encounter Date: 08/23/2019  OT End of Session - 08/23/19 1302    Visit Number  [id number]    Number of Visits  209    Date for OT Re-Evaluation  10/20/19    Authorization Type  Progress reporting period starting 08/02/2019    OT Start Time  1345    OT Stop Time  1430    OT Time Calculation (min)  45 min    Activity Tolerance  Patient tolerated treatment well    Behavior During Therapy  [facility] for tasks assessed/performed       Past Medical History:  Diagnosis Date  . GERD (gastroesophageal reflux disease)   . Hyperlipidemia   . Hypertension   . Obesity   . Stroke [facility])     Past Surgical History:  Procedure Laterality Date  . BRAIN SURGERY      There were no vitals filed for this visit.  Subjective Assessment - 08/23/19 1302    Subjective   Pt. Arrived with his bilateral leg rest on his w/c today.   Patient is accompanied by:  Family member    Pertinent History  Pt. is a 54 y.o. male who suffered a CVA on 05/01/2016. Pt. was admitted to the hospital. Once discharged, he received Home Health PT and OT services for about a month. Pt. has had multiple CVAs over the past 8 years, and has had multiple falls in the past 6 months. Pt. resides in an apartment. Pt. has caregivers for 80 hours. Pt.'s mother stays with pt. at night, and assists with IADL tasks.      Currently in Pain?  No/denies      OT TREATMENT  Neuromuscular re-ed:  Pt. worked on using his right hand for grasping, and manipulating 1", 3/4", and 1/2" washers from a magnetic dishgrasping with 2pt.pointpinch. Pt. worked on translatory movements of the right hand moving the objects through his hand from his palm to the tip of his 2nd digit,  and thumb, then reaching up to place them onto magnetic hooks positioned on an elevated surface above the tabletop.Pt. performed [facility] tasks using the Grooved pegboard. Pt. worked on grasping the grooved pegs from a horizontal position, and moving the pegs to a vertical position in the hand to prepare for placing them in the grooved slot. Pt. Removed the grooved pegs alternating thumb opposition to the tip of his 2nd digit to his thumb. Pt. performed [facility] tasks using the Grooved pegboard. Pt. worked on grasping the grooved pegs from a horizontal position, and moving the pegs to a vertical position in the hand to prepare for placing them in the grooved slot. Pt. Worked on using his right hand to grasp, and store the pegs in the palm of his hand. Pt. Worked on translatory movements moving each peg from his palm to the tip of his 2nd digit, and thumb prior to placing them into the container. Pt. Had difficulty moving them through his hand while holding multiple objects.  Response to Treatment:  Pt. reports that he has to follow-up with his physician at the end of this week after having had routine imagining on his brain following a remote brain tumor.  Pt. continues to make progress and requires fewer cues overall  toavoid compensating proximally with hiking the right shoulder, and leaning to the left, or right when reaching.Pt. was able to maintain his trunk in midline for the duration of the task, with no compensation proximally.Pt. continues to require assist manipulatingsmall objects, storing them in his hand, and performing translatory movements.Pt. drops a few items, however dropped fewer objects, and required rest breaks were today.Pt. continues to work on improving RUE strength, and [facility] skills in order to work towards improving RUE functioning during ADLs, and IADL tasks.                       OT Education - 08/23/19 1302    Education provided  Yes    Education Details   HEP, [place], reaching    Person(s) Educated  Patient    Methods  Explanation    Comprehension  Verbalized understanding;Returned demonstration;Verbal cues required       OT Short Term Goals - 03/03/17 1459      OT SHORT TERM GOAL #1   Title  `        OT Long Term Goals - 07/28/19 1504      OT LONG TERM GOAL #4   Title  Pt. will perform self dressing with minA and A/E as needed.    Baseline  Pt. continues to require assist with donning underwear over feet. Min-ModA donning his jacket. (Depending on the jacket)    Time  12    Period  Weeks    Status  Partially Met    Target Date  10/20/19      Long Term Additional Goals   Additional Long Term Goals  Yes      OT LONG TERM GOAL #6   Title  Pt. will write his name efficiently with 100% legibility    Baseline  Pt continues to present with limited with 50% legibility and efficiency when writing name to sign in when riding the [place]    Time  12    Period  Weeks    Status  On-going    Target Date  10/20/19      OT LONG TERM GOAL #7   Title  Pt will independently and consistently follow HEP to increase UE strength to increase functional independence    Baseline  Pt. continues to work towards independence with exercises    Time  12    Period  Weeks    Status  On-going    Target Date  10/20/19      OT LONG TERM GOAL #8   Title  Pt. will require supervision ironing a shirt.    Baseline  Pt. has progressed CGA, and complete set-up seated using a tabletop iron    Time  12    Period  Weeks    Status  On-going    Target Date  10/20/19      OT LONG TERM GOAL  #9   Baseline  Pt. will independently be able to sew a button onto a shirt.    Time  12    Period  Weeks    Status  On-going    Target Date  10/20/19      OT LONG TERM GOAL  #10   TITLE  Pt. will independently be able to throw a ball    Baseline  Pt. continues to be limited with throwing overhand, however pt. is able to perform underhand throwing.    Time  12    Period  Weeks    Status  On-going      OT LONG TERM GOAL  #11   TITLE  Pt. will improve UE functional reaching to be able to independently use his RUE to hand his clothes in the closest.    Baseline  Pt. continues to be able to reach for clothing pulling them off of the hangers. Pt. is unable to reach up to place hangers onto the the rack.    Time  12    Period  Weeks    Status  On-going    Target Date  10/20/19      OT LONG TERM GOAL  #13   TITLE  Pt. will increase BUE strength to be able to prepare for assisting with yardwork.    Baseline  Pt. is unable    Time  12    Period  Weeks    Status  New            Plan - 08/23/19 1303    Clinical Impression Statement  Pt. reports that he has to follow-up with his physician at the end of this week after having had routine imagining on his brain following a remote brain tumor.  Pt. continues to make progress and requires fewer cues overall toavoid compensating proximally with hiking the right shoulder, and leaning to the left, or right when reaching.Pt. was able to maintain his trunk in midline for the duration of the task, with no compensation proximally.Pt. continues to require assist manipulatingsmall objects, storing them in his hand, and performing translatory movements.Pt. drops a few items, however dropped fewer objects, and required rest breaks were today.Pt. continues to work on improving RUE strength, and [facility] skills in order to work towards improving RUE functioning during ADLs, and IADL tasks.     OT Occupational Profile and History  Problem Focused Assessment - Including review of records relating to presenting problem    Occupational performance deficits (Please refer to evaluation for details):  ADL's;IADL's    Body Structure / Function / Physical Skills  ADL;FMC;ROM;IADL;Pain;Coordination    Rehab Potential  Fair    Clinical Decision Making  Several treatment options, min-mod task modification necessary    Comorbidities  Affecting Occupational Performance:  May have comorbidities impacting occupational performance    Modification or Assistance to Complete Evaluation   Min-Moderate modification of tasks or assist with assess necessary to complete eval    OT Frequency  1x / week    OT Duration  12 weeks    OT Treatment/Interventions  Self-care/ADL training;Patient/family education;DME and/or AE instruction;Therapeutic exercise;Neuromuscular education       Patient will benefit from skilled therapeutic intervention in order to improve the following deficits and impairments:   Body Structure / Function / Physical Skills: ADL, FMC, ROM, IADL, Pain, Coordination       Visit Diagnosis: Muscle weakness (generalized)  Other lack of coordination    Problem List Patient Active Problem List   Diagnosis Date Noted  . Acute CVA (cerebrovascular accident) ([place]) 05/02/2016  . Left-sided weakness 05/01/2016    Harrel Carina, MS, OTR/L 08/23/2019, 1:09 PM  [place] MAIN [facility] SERVICES [address] [place], [place], [address] Phone: [phone]   Fax:  [phone]  Name: ROYLEE CHAFFIN MRN: [id number] Date of Birth: 1965/06/30

## 2019-08-25 ENCOUNTER — Ambulatory Visit: Payer: Medicare HMO | Admitting: Occupational Therapy

## 2019-08-25 ENCOUNTER — Encounter: Payer: Self-pay | Admitting: Occupational Therapy

## 2019-08-25 ENCOUNTER — Other Ambulatory Visit: Payer: Self-pay

## 2019-08-25 ENCOUNTER — Ambulatory Visit: Payer: Medicare HMO

## 2019-08-25 DIAGNOSIS — R278 Other lack of coordination: Secondary | ICD-10-CM

## 2019-08-25 DIAGNOSIS — M6281 Muscle weakness (generalized): Secondary | ICD-10-CM

## 2019-08-25 DIAGNOSIS — R2689 Other abnormalities of gait and mobility: Secondary | ICD-10-CM

## 2019-08-25 DIAGNOSIS — I69351 Hemiplegia and hemiparesis following cerebral infarction affecting right dominant side: Secondary | ICD-10-CM

## 2019-08-25 NOTE — Therapy (Signed)
[place] MAIN [facility] SERVICES [address] [place], [place], [address] Phone: [phone]   Fax:  [phone]  Physical Therapy Treatment  Patient Details  Name: Curtis Powell MRN: [id number] Date of Birth: 10/26/1965 No data recorded  Encounter Date: 08/25/2019  PT End of Session - 08/25/19 1429    Visit Number  [id number]    Number of Visits  210    Date for PT Re-Evaluation  10/20/19    Authorization Type  3/10 PN 08/11/19    PT Start Time  1345    PT Stop Time  1428    PT Time Calculation (min)  43 min    Equipment Utilized During Treatment  Gait belt    Activity Tolerance  Patient tolerated treatment well;No increased pain    Behavior During Therapy  WFL for tasks assessed/performed       Past Medical History:  Diagnosis Date  . GERD (gastroesophageal reflux disease)   . Hyperlipidemia   . Hypertension   . Obesity   . Stroke [facility])     Past Surgical History:  Procedure Laterality Date  . BRAIN SURGERY      There were no vitals filed for this visit.  Subjective Assessment - 08/25/19 1428    Subjective  Patient reports some pain in hand but does not give number. No falls or LOB since last session.    Pertinent History   Patient is a pleasant 54 year old male who presents to physical therapy for weakness and immobility secondary to CVA.  Had a stroke in Feb 2018. He was previously fully independent, but this stroke caused severe residual deficits, mainly on the right side as well as speech, and now he is unable to walk or perform most of his ADLs on his own. Entire right side is very weak. He still has a little difficulty with speech but his swallowing is improved to baseline. His mom had to move in with him and now is his main caregiver.     Limitations  Sitting;Lifting;Standing;Walking;House hold activities    How long can you sit comfortably?  5 minutes    How long can you stand comfortably?  5 minutes    How long can you walk  comfortably?  10 ft    Patient Stated Goals  Walk without walker, walk further with walker, get some strength in back     Currently in Pain?  No/denies             Treatment: In // bars: close CGA with cueing for stability - forward ambulation with cues for uprigh tposture, heel strike, and step length; then backwards ambulation length back to chair 3x (6 lengths total)  -static stand 60 seconds -static stand weight shift/"beebop" left right x 60 seconds -2" step toe taps BUE support cues for hip/knee flexion for clearance. Target negotiation 15x each LE  -lateral stepping 2x length of //bars, very fatiguing to patient.   -static standing throwing ball at target x 15 balls; SUE support      ambulate 100 ft with CGA with RW, cues for foot clearance, upright posture, tactile cueing to PSIS and ASIS for body mechanics and sequencing; x 2 trials    Patient requires seated rest breaks between standing interventions due to fatigue, shortness of breath, and elevated HR with exercise.    Patient continues to present with excellent motivation. He does demonstrate increased internal rotation of L foot with prolonged ambulation resulting in instability  with fatigue. Standing position requires UE use for dynamic mobility but is able to be maintained statically without UE support for short duration.   Patient will continue to benefit from skilled therapy in order to improve dynamic standing balance and increase endurance                     PT Education - 08/25/19 1428    Education provided  Yes    Education Details  exercise technique, body mechanics    Person(s) Educated  Patient    Methods  Explanation;Demonstration;Tactile cues;Verbal cues    Comprehension  Returned demonstration;Verbal cues required;Tactile cues required;Verbalized understanding       PT Short Term Goals - 04/07/19 1504      PT SHORT TERM GOAL #1   Title  Patient will be independent in home exercise  program to improve strength/mobility for better functional independence with ADLs.    Baseline  hep compliant    Time  2    Period  Weeks    Status  Achieved      PT SHORT TERM GOAL #2   Title  Patient will require min cueing for STS transfer with CGA for increased independence with mobility.     Baseline  CGA    Time  2    Period  Weeks    Status  Achieved      PT SHORT TERM GOAL #3   Title  Patient will maintain upright posture for > 15 seconds to demonstrate strengthened postural control muscualture     Baseline  18 seconds     Time  2    Period  Weeks    Status  Achieved        PT Long Term Goals - 07/28/19 0001      PT LONG TERM GOAL #1   Title  Patient will perform bed mobility (rolling and supine <>sit) with Mod I  to increase mobility and  perform ADLs.     Baseline  4/21: unable to perform     Time  12    Period  Weeks    Status  New    Target Date  10/20/19      PT LONG TERM GOAL #2   Title  Patient (< 38 years old) will complete five times sit to stand test in < 10 seconds indicating an increased LE strength and improved balance.    Baseline  4/21: 18.8 seconds 06/02/19: 19.93 seconds one hand on RW, one hand on w/c 12/30: 16 secpmds BUE support 02/11/19: 36 seconds 5/19: 20.6s from [facility], one hand on walker and one on WC, 80% upright posture 7/1: 22 seconds one hand on walker one hand on w/c  9/14: 48 seconds with full upright posture    Time  12    Period  Weeks    Status  Partially Met    Target Date  10/20/19      PT LONG TERM GOAL #3   Title  Patient will increase Berg Balance score by > 6 points (26/56) to demonstrate decreased fall risk during functional activities.    Baseline  4/21: 32/56 06/02/19: 28/56 12/23: 25/56 9/12: 20/56 10/15: 21/56 11/7: 20/56 12/19: 22/56; 08/26/18: 11/56 7/1: 17/56  9/14: 23/56, 07/06/19=28/56    Time  8    Period  Weeks    Status  Achieved      PT LONG TERM GOAL #4   Title  Patient will increase BLE gross strength  to 4+/5 as  to improve functional strength for independent gait, increased standing tolerance and increased ADL ability.    Baseline   4/21: see note 06/02/19: hip flexion 4-/5, extension/abd 3/5 12/30: hip flexion 4/5 hip extension and abduction 3/5 02/11/19: hip flexion 4/5 hip extension and abduction 2+/54-/5 RLE, 10/15: 4/5 RLE 11/7: 4/5 RLE 3/3: 4/5 7/1:  hip flexion 4/5 hip extension 2/5 hip abduction/adduciton 2/5     Time  12    Period  Weeks    Status  Partially Met    Target Date  09/22/19      PT LONG TERM GOAL #5   Title  Patient will increase lower extremity functional scale to >40/80 to demonstrate improved functional mobility and increased tolerance with ADLs.     Baseline  4/21: terminated due to cognition 06/02/19: 31/80 12/30: 35/80 02/10/19: 34/56 14/80; 12/26: 21/80; 2/18: 28/80  4/10; 25/80 5/29: 27/80; 7/16: 29/80 8/22: 30/80  9/12: 26/80 (feeling more tired due to moving) 10/15: 37/80 11/7: 23/80 3/3: 32/80; 08/25/18: 28/80 9/15: 32/56    Status  Deferred      PT LONG TERM GOAL #6   Title  Patient will ambulate 200 ft with least assistive device and Supervision with no breaks to allow for increased mobility within home.    Baseline  4/21: ambulate 150 ft     Time  12    Period  Weeks    Status  New    Target Date  10/20/19      PT LONG TERM GOAL #7   Title  Patient will perform 10 MWT in <30 seconds for improved gait speed, gait mechanics, and functional capacity for mobility.     Baseline   07/28/19: 51 seconds with RW 06/02/19: 62.5 seconds with RW 11/4: 56 seconds with AFO and RW 12/30: 53 seconds with AFO and RW     Time  12    Period  Weeks    Status  Partially Met    Target Date  10/20/19      PT LONG TERM GOAL #8   Title  Patient will increase Berg Balance score by > 6 points (38/56) to demonstrate decreased fall risk during functional activities.    Baseline  07/28/19: 32/56     Time  12    Period  Weeks    Status  New    Target Date  10/20/19            Plan -  08/25/19 1430    Clinical Impression Statement  Patient continues to present with excellent motivation. He does demonstrate increased internal rotation of L foot with prolonged ambulation resulting in instability with fatigue. Standing position requires UE use for dynamic mobility but is able to be maintained statically without UE support for short duration.   Patient will continue to benefit from skilled therapy in order to improve dynamic standing balance and increase endurance    Rehab Potential  Fair    Clinical Impairments Affecting Rehab Potential  hx of HTN, HLD, CVA, learning disability, Diabetes, brain tumor,     PT Frequency  2x / week    PT Duration  12 weeks    PT Treatment/Interventions  ADLs/Self Care Home Management;Aquatic Therapy;Ultrasound;Moist Heat;Traction;DME Instruction;Gait training;Stair training;Functional mobility training;Therapeutic activities;Therapeutic exercise;Orthotic Fit/Training;Neuromuscular re-education;Balance training;Patient/family education;Manual techniques;Wheelchair mobility training;Passive range of motion;Energy conservation;Taping;Visual/perceptual remediation/compensation    PT Next Visit Plan  progress standing balance    PT Home Exercise Plan  TrA contraction, glute sets:  needs updates. Potentially some time in supine flat in hospital bed, to improve hip extension deficits.     Consulted and Agree with Plan of Care  Patient       Patient will benefit from skilled therapeutic intervention in order to improve the following deficits and impairments:  Abnormal gait, Decreased activity tolerance, Decreased balance, Decreased knowledge of precautions, Decreased endurance, Decreased coordination, Decreased knowledge of use of DME, Decreased mobility, Decreased range of motion, Difficulty walking, Decreased safety awareness, Decreased strength, Impaired flexibility, Impaired perceived functional ability, Impaired tone, Postural dysfunction, Improper body  mechanics, Pain  Visit Diagnosis: Muscle weakness (generalized)  Other lack of coordination  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 Acute CVA (cerebrovascular accident) ([place]) 05/02/2016  . Left-sided weakness 05/01/2016   Janna Arch, PT, DPT   08/25/2019, 2:31 PM  [place] MAIN [facility] SERVICES [address] [place], [place], [address] Phone: [phone]   Fax:  [phone]  Name: Curtis Powell MRN: [id number] Date of Birth: Aug 25, 1965

## 2019-08-25 NOTE — Therapy (Signed)
[place] MAIN [facility] SERVICES [address] [place], [place], [address] Phone: [phone]   Fax:  [phone]  Occupational Therapy Treatment  Patient Details  Name: Curtis Powell MRN: [id number] Date of Birth: 1965-11-23 No data recorded  Encounter Date: 08/25/2019  OT End of Session - 08/25/19 1308    Visit Number  [id number]    Number of Visits  209    Date for OT Re-Evaluation  10/20/19    Authorization Type  Progress reporting period starting 08/02/2019    OT Start Time  1300    OT Stop Time  1345    OT Time Calculation (min)  45 min    Activity Tolerance  Patient tolerated treatment well    Behavior During Therapy  [facility] for tasks assessed/performed       Past Medical History:  Diagnosis Date  . GERD (gastroesophageal reflux disease)   . Hyperlipidemia   . Hypertension   . Obesity   . Stroke [facility])     Past Surgical History:  Procedure Laterality Date  . BRAIN SURGERY      There were no vitals filed for this visit.  Subjective Assessment - 08/25/19 1307    Subjective   Pt. reports that he is feeling good today    Patient is accompanied by:  Family member    Pertinent History  Pt. is a 54 y.o. male who suffered a CVA on 05/01/2016. Pt. was admitted to the hospital. Once discharged, he received Home Health PT and OT services for about a month. Pt. has had multiple CVAs over the past 8 years, and has had multiple falls in the past 6 months. Pt. resides in an apartment. Pt. has caregivers for 80 hours. Pt.'s mother stays with pt. at night, and assists with IADL tasks.      Patient Stated Goals  To be able to throw a ball and dribble a ball, do as much as I can for myself.     Currently in Pain?  No/denies      OT TREATMENT  Neuromuscular re-ed:  Pt. worked on reaching using the [employer]. Pt. grasped and moved the shapes through 3 progressively higher vertical dowels of varying heights. Pt. had periods of leaning at the trunk  when reaching during the task.  Pt. worked on using his right hand during [facility] skills for grasping 1" sticks, 1/4" collars, and 1/4" washers. Pt. worked on storing the 1" sticks in the palm of his hand. Pt. Worked on grasping, and placing the small 1/4" collars, and washers on the pegboard.  Pt. Worked on removing the washers and collars with alternating thumb opposition to the tip of his 2nd-5th digits and thumb. Pt. worked on removing the pegs using bilateral alternating hand patterns.  Response to Treatment:  Pt. reports that he has to follow-up with his Neurologist at the end of this week after having had routine imagining on his brain following a remote brain tumor. Pt. continues to make progress, however pt required more cues initially today toavoid compensating proximally with hiking the right shoulder, and leaning to the left, or right when reaching.Pt. was able to maintain his trunk in midline for the remainder of the task, with no compensation proximally.Pt. dropped multiple 1" sticks from his hand when attempting to store them in the palm of his hand.  Pt. continues to work on improving RUE strength, and [facility] skills in order to work towards improving RUE functioning, functional reaching, and  [facility] skills during ADLs, and IADL tasks.                       OT Education - 08/25/19 1308    Education provided  Yes    Education Details  HEP, [place], reaching    Person(s) Educated  Patient    Methods  Explanation    Comprehension  Verbalized understanding;Returned demonstration;Verbal cues required       OT Short Term Goals - 03/03/17 1459      OT SHORT TERM GOAL #1   Title         OT Long Term Goals - 07/28/19 1504      OT LONG TERM GOAL #4   Title  Pt. will perform self dressing with minA and A/E as needed.    Baseline  Pt. continues to require assist with donning underwear over feet. Min-ModA donning his jacket. (Depending on the jacket)    Time  12     Period  Weeks    Status  Partially Met    Target Date  10/20/19      Long Term Additional Goals   Additional Long Term Goals  Yes      OT LONG TERM GOAL #6   Title  Pt. will write his name efficiently with 100% legibility    Baseline  Pt continues to present with limited with 50% legibility and efficiency when writing name to sign in when riding the [place]    Time  12    Period  Weeks    Status  On-going    Target Date  10/20/19      OT LONG TERM GOAL #7   Title  Pt will independently and consistently follow HEP to increase UE strength to increase functional independence    Baseline  Pt. continues to work towards independence with exercises    Time  12    Period  Weeks    Status  On-going    Target Date  10/20/19      OT LONG TERM GOAL #8   Title  Pt. will require supervision ironing a shirt.    Baseline  Pt. has progressed CGA, and complete set-up seated using a tabletop iron    Time  12    Period  Weeks    Status  On-going    Target Date  10/20/19      OT LONG TERM GOAL  #9   Baseline  Pt. will independently be able to sew a button onto a shirt.    Time  12    Period  Weeks    Status  On-going    Target Date  10/20/19      OT LONG TERM GOAL  #10   TITLE  Pt. will independently be able to throw a ball    Baseline  Pt. continues to be limited with throwing overhand, however pt. is able to perform underhand throwing.    Time  12    Period  Weeks    Status  On-going      OT LONG TERM GOAL  #11   TITLE  Pt. will improve UE functional reaching to be able to independently use his RUE to hand his clothes in the closest.    Baseline  Pt. continues to be able to reach for clothing pulling them off of the hangers. Pt. is unable to reach up to place hangers onto the the rack.    Time  12  Period  Weeks    Status  On-going    Target Date  10/20/19      OT LONG TERM GOAL  #13   TITLE  Pt. will increase BUE strength to be able to prepare for assisting with yardwork.     Baseline  Pt. is unable    Time  12    Period  Weeks    Status  New            Plan - 08/25/19 1310    Clinical Impression Statement  Pt. reports that he has to follow-up with his Neurologist at the end of this week after having had routine imagining on his brain following a remote brain tumor. Pt. continues to make progress, however pt required more cues initially today toavoid compensating proximally with hiking the right shoulder, and leaning to the left, or right when reaching.Pt. was able to maintain his trunk in midline for the remainder of the task, with no compensation proximally.Pt. dropped multiple 1" sticks from his hand when attempting to store them in the palm of his hand.  Pt. continues to work on improving RUE strength, and [facility] skills in order to work towards improving RUE functioning, functional reaching, and [facility] skills during ADLs, and IADL tasks.   OT Occupational Profile and History  Problem Focused Assessment - Including review of records relating to presenting problem    Occupational performance deficits (Please refer to evaluation for details):  ADL's;IADL's    Body Structure / Function / Physical Skills  ADL;FMC;ROM;IADL;Pain;Coordination    Rehab Potential  Fair    Clinical Decision Making  Several treatment options, min-mod task modification necessary    Comorbidities Affecting Occupational Performance:  May have comorbidities impacting occupational performance    Modification or Assistance to Complete Evaluation   Min-Moderate modification of tasks or assist with assess necessary to complete eval    OT Frequency  1x / week    OT Duration  12 weeks    OT Treatment/Interventions  Self-care/ADL training;Patient/family education;DME and/or AE instruction;Therapeutic exercise;Neuromuscular education    Consulted and Agree with Plan of Care  Patient       Patient will benefit from skilled therapeutic intervention in order to improve the following deficits and  impairments:   Body Structure / Function / Physical Skills: ADL, FMC, ROM, IADL, Pain, Coordination       Visit Diagnosis: Muscle weakness (generalized)  Other lack of coordination    Problem List Patient Active Problem List   Diagnosis Date Noted  . Acute CVA (cerebrovascular accident) ([place]) 05/02/2016  . Left-sided weakness 05/01/2016    Harrel Carina, MS, OTR/L 08/25/2019, 1:12 PM  [place] MAIN [facility] SERVICES [address] [place], [place], [address] Phone: [phone]   Fax:  [phone]  Name: DEMARKIS GHEEN MRN: [id number] Date of Birth: 07-02-1965

## 2019-08-30 ENCOUNTER — Ambulatory Visit: Payer: Medicare HMO

## 2019-08-30 ENCOUNTER — Encounter: Payer: Medicare HMO | Admitting: Occupational Therapy

## 2019-09-01 ENCOUNTER — Ambulatory Visit: Payer: Medicare HMO

## 2019-09-01 ENCOUNTER — Ambulatory Visit: Payer: Medicare HMO | Admitting: Occupational Therapy

## 2019-09-01 ENCOUNTER — Other Ambulatory Visit: Payer: Self-pay

## 2019-09-01 DIAGNOSIS — M6281 Muscle weakness (generalized): Secondary | ICD-10-CM

## 2019-09-01 DIAGNOSIS — I69351 Hemiplegia and hemiparesis following cerebral infarction affecting right dominant side: Secondary | ICD-10-CM

## 2019-09-01 DIAGNOSIS — R278 Other lack of coordination: Secondary | ICD-10-CM

## 2019-09-01 DIAGNOSIS — R2689 Other abnormalities of gait and mobility: Secondary | ICD-10-CM

## 2019-09-01 NOTE — Therapy (Signed)
[place] MAIN [facility] SERVICES [address] [place], [place], [address] Phone: [phone]   Fax:  [phone]  Physical Therapy Treatment  Patient Details  Name: Curtis Powell MRN: [id number] Date of Birth: May 08, 1965 No data recorded  Encounter Date: 09/01/2019  PT End of Session - 09/01/19 1418    Visit Number  [id number]    Number of Visits  210    Date for PT Re-Evaluation  10/20/19    Authorization Type  4/10 PN 08/11/19    PT Start Time  1346    PT Stop Time  1429    PT Time Calculation (min)  43 min    Equipment Utilized During Treatment  Gait belt    Activity Tolerance  Patient tolerated treatment well;No increased pain    Behavior During Therapy  WFL for tasks assessed/performed       Past Medical History:  Diagnosis Date  . GERD (gastroesophageal reflux disease)   . Hyperlipidemia   . Hypertension   . Obesity   . Stroke [facility])     Past Surgical History:  Procedure Laterality Date  . BRAIN SURGERY      There were no vitals filed for this visit.  Subjective Assessment - 09/01/19 1411    Subjective  Patient reports no falls or LOB since last session, has been walking with caregiver. Missed his OT appointment earlier due to his transportatin being late.    Pertinent History   Patient is a pleasant 54 year old male who presents to physical therapy for weakness and immobility secondary to CVA.  Had a stroke in Feb 2018. He was previously fully independent, but this stroke caused severe residual deficits, mainly on the right side as well as speech, and now he is unable to walk or perform most of his ADLs on his own. Entire right side is very weak. He still has a little difficulty with speech but his swallowing is improved to baseline. His mom had to move in with him and now is his main caregiver.     Limitations  Sitting;Lifting;Standing;Walking;House hold activities    How long can you sit comfortably?  5 minutes    How long can you  stand comfortably?  5 minutes    How long can you walk comfortably?  10 ft    Patient Stated Goals  Walk without walker, walk further with walker, get some strength in back     Currently in Pain?  No/denies         Treatment: In // bars: close CGA with cueing for stability - forward ambulation with cues for upright posture, heel strike, and step length; then backwards ambulation length back to chair 3x (6 lengths total) ; very challenging with upright posture  -static stand 60 seconds     ambulate 190 ft with CGA with RW, cues for foot clearance, upright posture, tactile cueing to PSIS and ASIS for body mechanics and sequencing; x 2 trials with one seated rest break   Seated: Punching cross body to targets in varying planes of motion x2 minutes; cues for upright posture  RTB abduction 20x  RTB around ankles alternating LAQ  Seated gluteal squeeze 15x 3 second holds  GTB marching ; cues for not leaning back against chair 15x each LE  Patient requires seated rest breaks between standing interventions due to fatigue, shortness of breath, and elevated HR with exercise.    Patient continues to progress with functional capacity of ambulation with  one rest break required for prolonged ambulation. Use of mirror and additional therapist for visual cue of upright posture improved patients trunk positioning and step length.  Patient will continue to benefit from skilled therapy in order to improve dynamic standing balance and increase endurance                        PT Education - 09/01/19 1412    Education provided  Yes    Education Details  exercise technique, AFO usage    Person(s) Educated  Patient    Methods  Explanation;Demonstration;Tactile cues;Verbal cues    Comprehension  Verbalized understanding;Returned demonstration;Verbal cues required;Tactile cues required       PT Short Term Goals - 04/07/19 1504      PT SHORT TERM GOAL #1   Title  Patient will be  independent in home exercise program to improve strength/mobility for better functional independence with ADLs.    Baseline  hep compliant    Time  2    Period  Weeks    Status  Achieved      PT SHORT TERM GOAL #2   Title  Patient will require min cueing for STS transfer with CGA for increased independence with mobility.     Baseline  CGA    Time  2    Period  Weeks    Status  Achieved      PT SHORT TERM GOAL #3   Title  Patient will maintain upright posture for > 15 seconds to demonstrate strengthened postural control muscualture     Baseline  18 seconds     Time  2    Period  Weeks    Status  Achieved        PT Long Term Goals - 07/28/19 0001      PT LONG TERM GOAL #1   Title  Patient will perform bed mobility (rolling and supine <>sit) with Mod I  to increase mobility and  perform ADLs.     Baseline  4/21: unable to perform     Time  12    Period  Weeks    Status  New    Target Date  10/20/19      PT LONG TERM GOAL #2   Title  Patient (< 37 years old) will complete five times sit to stand test in < 10 seconds indicating an increased LE strength and improved balance.    Baseline  4/21: 18.8 seconds 06/02/19: 19.93 seconds one hand on RW, one hand on w/c 12/30: 16 secpmds BUE support 02/11/19: 36 seconds 5/19: 20.6s from [facility], one hand on walker and one on WC, 80% upright posture 7/1: 22 seconds one hand on walker one hand on w/c  9/14: 48 seconds with full upright posture    Time  12    Period  Weeks    Status  Partially Met    Target Date  10/20/19      PT LONG TERM GOAL #3   Title  Patient will increase Berg Balance score by > 6 points (26/56) to demonstrate decreased fall risk during functional activities.    Baseline  4/21: 32/56 06/02/19: 28/56 12/23: 25/56 9/12: 20/56 10/15: 21/56 11/7: 20/56 12/19: 22/56; 08/26/18: 11/56 7/1: 17/56  9/14: 23/56, 07/06/19=28/56    Time  8    Period  Weeks    Status  Achieved      PT LONG TERM GOAL #4   Title  Patient  will increase  BLE gross strength to 4+/5 as to improve functional strength for independent gait, increased standing tolerance and increased ADL ability.    Baseline   4/21: see note 06/02/19: hip flexion 4-/5, extension/abd 3/5 12/30: hip flexion 4/5 hip extension and abduction 3/5 02/11/19: hip flexion 4/5 hip extension and abduction 2+/54-/5 RLE, 10/15: 4/5 RLE 11/7: 4/5 RLE 3/3: 4/5 7/1:  hip flexion 4/5 hip extension 2/5 hip abduction/adduciton 2/5     Time  12    Period  Weeks    Status  Partially Met    Target Date  09/22/19      PT LONG TERM GOAL #5   Title  Patient will increase lower extremity functional scale to >40/80 to demonstrate improved functional mobility and increased tolerance with ADLs.     Baseline  4/21: terminated due to cognition 06/02/19: 31/80 12/30: 35/80 02/10/19: 34/56 14/80; 12/26: 21/80; 2/18: 28/80  4/10; 25/80 5/29: 27/80; 7/16: 29/80 8/22: 30/80  9/12: 26/80 (feeling more tired due to moving) 10/15: 37/80 11/7: 23/80 3/3: 32/80; 08/25/18: 28/80 9/15: 32/56    Status  Deferred      PT LONG TERM GOAL #6   Title  Patient will ambulate 200 ft with least assistive device and Supervision with no breaks to allow for increased mobility within home.    Baseline  4/21: ambulate 150 ft     Time  12    Period  Weeks    Status  New    Target Date  10/20/19      PT LONG TERM GOAL #7   Title  Patient will perform 10 MWT in <30 seconds for improved gait speed, gait mechanics, and functional capacity for mobility.     Baseline   07/28/19: 51 seconds with RW 06/02/19: 62.5 seconds with RW 11/4: 56 seconds with AFO and RW 12/30: 53 seconds with AFO and RW     Time  12    Period  Weeks    Status  Partially Met    Target Date  10/20/19      PT LONG TERM GOAL #8   Title  Patient will increase Berg Balance score by > 6 points (38/56) to demonstrate decreased fall risk during functional activities.    Baseline  07/28/19: 32/56     Time  12    Period  Weeks    Status  New    Target Date   10/20/19            Plan - 09/01/19 1419    Clinical Impression Statement  Patient continues to progress with functional capacity of ambulation with one rest break required for prolonged ambulation. Use of mirror and additional therapist for visual cue of upright posture improved patients trunk positioning and step length.  Patient will continue to benefit from skilled therapy in order to improve dynamic standing balance and increase endurance    Rehab Potential  Fair    Clinical Impairments Affecting Rehab Potential  hx of HTN, HLD, CVA, learning disability, Diabetes, brain tumor,     PT Frequency  2x / week    PT Duration  12 weeks    PT Treatment/Interventions  ADLs/Self Care Home Management;Aquatic Therapy;Ultrasound;Moist Heat;Traction;DME Instruction;Gait training;Stair training;Functional mobility training;Therapeutic activities;Therapeutic exercise;Orthotic Fit/Training;Neuromuscular re-education;Balance training;Patient/family education;Manual techniques;Wheelchair mobility training;Passive range of motion;Energy conservation;Taping;Visual/perceptual remediation/compensation    PT Next Visit Plan  progress standing balance    PT Home Exercise Plan  TrA contraction, glute sets: needs updates. Potentially some time  in supine flat in hospital bed, to improve hip extension deficits.     Consulted and Agree with Plan of Care  Patient       Patient will benefit from skilled therapeutic intervention in order to improve the following deficits and impairments:  Abnormal gait, Decreased activity tolerance, Decreased balance, Decreased knowledge of precautions, Decreased endurance, Decreased coordination, Decreased knowledge of use of DME, Decreased mobility, Decreased range of motion, Difficulty walking, Decreased safety awareness, Decreased strength, Impaired flexibility, Impaired perceived functional ability, Impaired tone, Postural dysfunction, Improper body mechanics, Pain  Visit  Diagnosis: Muscle weakness (generalized)  Other lack of coordination  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 Acute CVA (cerebrovascular accident) ([place]) 05/02/2016  . Left-sided weakness 05/01/2016   Janna Arch, PT, DPT   09/01/2019, 2:28 PM  [place] MAIN [facility] SERVICES [address] [place], [place], [address] Phone: [phone]   Fax:  [phone]  Name: Curtis Powell MRN: [id number] Date of Birth: Dec 02, 1965

## 2019-09-03 ENCOUNTER — Other Ambulatory Visit: Payer: Self-pay

## 2019-09-03 ENCOUNTER — Ambulatory Visit
Admission: RE | Admit: 2019-09-03 | Discharge: 2019-09-03 | Disposition: A | Discharge status: Home / Self Care | Payer: Medicare HMO | Source: Ambulatory Visit | Attending: Neurology | Admitting: Neurology

## 2019-09-03 DIAGNOSIS — D496 Neoplasm of unspecified behavior of brain: Secondary | ICD-10-CM | POA: Diagnosis not present

## 2019-09-03 MED ORDER — GADOBUTROL 1 MMOL/ML IV SOLN
10.0000 mL | Freq: Once | INTRAVENOUS | Status: AC | PRN
  Administered 2019-09-03: 10 mL via INTRAVENOUS

## 2019-09-08 ENCOUNTER — Encounter: Payer: Self-pay | Admitting: Occupational Therapy

## 2019-09-08 ENCOUNTER — Ambulatory Visit: Payer: Medicare HMO

## 2019-09-08 ENCOUNTER — Other Ambulatory Visit: Payer: Self-pay

## 2019-09-08 ENCOUNTER — Ambulatory Visit: Payer: Medicare HMO | Attending: Family Medicine | Admitting: Occupational Therapy

## 2019-09-08 DIAGNOSIS — M6281 Muscle weakness (generalized): Secondary | ICD-10-CM

## 2019-09-08 DIAGNOSIS — R278 Other lack of coordination: Secondary | ICD-10-CM | POA: Insufficient documentation

## 2019-09-08 DIAGNOSIS — R2689 Other abnormalities of gait and mobility: Secondary | ICD-10-CM | POA: Insufficient documentation

## 2019-09-08 DIAGNOSIS — I69351 Hemiplegia and hemiparesis following cerebral infarction affecting right dominant side: Secondary | ICD-10-CM

## 2019-09-08 NOTE — Therapy (Signed)
[place] MAIN [facility] SERVICES [address] [place], [place], [address] Phone: [phone]   Fax:  [phone]  Physical Therapy Treatment  Patient Details  Name: Curtis Powell MRN: [id number] Date of Birth: 1966-04-06 No data recorded  Encounter Date: 09/08/2019  PT End of Session - 09/08/19 1351    Visit Number  [id number]    Number of Visits  210    Date for PT Re-Evaluation  10/20/19    Authorization Type  5/10 PN 08/11/19    PT Start Time  1347    PT Stop Time  1430    PT Time Calculation (min)  43 min    Equipment Utilized During Treatment  Gait belt    Activity Tolerance  Patient tolerated treatment well;No increased pain    Behavior During Therapy  WFL for tasks assessed/performed       Past Medical History:  Diagnosis Date  . GERD (gastroesophageal reflux disease)   . Hyperlipidemia   . Hypertension   . Obesity   . Stroke [facility])     Past Surgical History:  Procedure Laterality Date  . BRAIN SURGERY      There were no vitals filed for this visit.  Subjective Assessment - 09/08/19 1350    Subjective  Patient reports having a good long weekend. Had a cookout. Has been walking with aide. No falls or LOB since last session.    Pertinent History   Patient is a pleasant 54 year old male who presents to physical therapy for weakness and immobility secondary to CVA.  Had a stroke in Feb 2018. He was previously fully independent, but this stroke caused severe residual deficits, mainly on the right side as well as speech, and now he is unable to walk or perform most of his ADLs on his own. Entire right side is very weak. He still has a little difficulty with speech but his swallowing is improved to baseline. His mom had to move in with him and now is his main caregiver.     Limitations  Sitting;Lifting;Standing;Walking;House hold activities    How long can you sit comfortably?  5 minutes    How long can you stand comfortably?  5 minutes    How long can you walk comfortably?  10 ft    Patient Stated Goals  Walk without walker, walk further with walker, get some strength in back     Currently in Pain?  No/denies              Treatment:  Nustep lvl 3-4 RPM> 70 4 minutes for cardiovascular support/challenge.   In // bars: close CGA with cueing for stability - forward ambulation with cues for upright posture, heel strike, and step length; then backwards ambulation length back to chair 3x (6 lengths total) ; very challenging with upright posture  -static stand 60 seconds  -lateral stepping 2x length of // bars.     ambulate 190 ft with CGA with RW, cues for foot clearance, upright posture, tactile cueing to PSIS and ASIS for body mechanics and sequencing; x 2 trials with one seated rest break   Seated: Punching cross body to targets in varying planes of motion x2 minutes; cues for upright posture     Patient requires seated rest breaks between standing interventions due to fatigue, shortness of breath, and elevated HR with exercise.       Pt educated throughout session about proper posture and technique with exercises. Improved exercise technique,  movement at target joints, use of target muscles after min to mod verbal, visual, tactile cues.                       PT Education - 09/08/19 1351    Education provided  Yes    Education Details  exercise technique, body mechanics    Person(s) Educated  Patient    Methods  Explanation;Demonstration;Tactile cues;Verbal cues    Comprehension  Verbalized understanding;Returned demonstration;Verbal cues required;Tactile cues required       PT Short Term Goals - 04/07/19 1504      PT SHORT TERM GOAL #1   Title  Patient will be independent in home exercise program to improve strength/mobility for better functional independence with ADLs.    Baseline  hep compliant    Time  2    Period  Weeks    Status  Achieved      PT SHORT TERM GOAL #2   Title   Patient will require min cueing for STS transfer with CGA for increased independence with mobility.     Baseline  CGA    Time  2    Period  Weeks    Status  Achieved      PT SHORT TERM GOAL #3   Title  Patient will maintain upright posture for > 15 seconds to demonstrate strengthened postural control muscualture     Baseline  18 seconds     Time  2    Period  Weeks    Status  Achieved        PT Long Term Goals - 07/28/19 0001      PT LONG TERM GOAL #1   Title  Patient will perform bed mobility (rolling and supine <>sit) with Mod I  to increase mobility and  perform ADLs.     Baseline  4/21: unable to perform     Time  12    Period  Weeks    Status  New    Target Date  10/20/19      PT LONG TERM GOAL #2   Title  Patient (< 67 years old) will complete five times sit to stand test in < 10 seconds indicating an increased LE strength and improved balance.    Baseline  4/21: 18.8 seconds 06/02/19: 19.93 seconds one hand on RW, one hand on w/c 12/30: 16 secpmds BUE support 02/11/19: 36 seconds 5/19: 20.6s from [facility], one hand on walker and one on WC, 80% upright posture 7/1: 22 seconds one hand on walker one hand on w/c  9/14: 48 seconds with full upright posture    Time  12    Period  Weeks    Status  Partially Met    Target Date  10/20/19      PT LONG TERM GOAL #3   Title  Patient will increase Berg Balance score by > 6 points (26/56) to demonstrate decreased fall risk during functional activities.    Baseline  4/21: 32/56 06/02/19: 28/56 12/23: 25/56 9/12: 20/56 10/15: 21/56 11/7: 20/56 12/19: 22/56; 08/26/18: 11/56 7/1: 17/56  9/14: 23/56, 07/06/19=28/56    Time  8    Period  Weeks    Status  Achieved      PT LONG TERM GOAL #4   Title  Patient will increase BLE gross strength to 4+/5 as to improve functional strength for independent gait, increased standing tolerance and increased ADL ability.    Baseline   4/21: see  note 06/02/19: hip flexion 4-/5, extension/abd 3/5 12/30: hip flexion  4/5 hip extension and abduction 3/5 02/11/19: hip flexion 4/5 hip extension and abduction 2+/54-/5 RLE, 10/15: 4/5 RLE 11/7: 4/5 RLE 3/3: 4/5 7/1:  hip flexion 4/5 hip extension 2/5 hip abduction/adduciton 2/5     Time  12    Period  Weeks    Status  Partially Met    Target Date  09/22/19      PT LONG TERM GOAL #5   Title  Patient will increase lower extremity functional scale to >40/80 to demonstrate improved functional mobility and increased tolerance with ADLs.     Baseline  4/21: terminated due to cognition 06/02/19: 31/80 12/30: 35/80 02/10/19: 34/56 14/80; 12/26: 21/80; 2/18: 28/80  4/10; 25/80 5/29: 27/80; 7/16: 29/80 8/22: 30/80  9/12: 26/80 (feeling more tired due to moving) 10/15: 37/80 11/7: 23/80 3/3: 32/80; 08/25/18: 28/80 9/15: 32/56    Status  Deferred      PT LONG TERM GOAL #6   Title  Patient will ambulate 200 ft with least assistive device and Supervision with no breaks to allow for increased mobility within home.    Baseline  4/21: ambulate 150 ft     Time  12    Period  Weeks    Status  New    Target Date  10/20/19      PT LONG TERM GOAL #7   Title  Patient will perform 10 MWT in <30 seconds for improved gait speed, gait mechanics, and functional capacity for mobility.     Baseline   07/28/19: 51 seconds with RW 06/02/19: 62.5 seconds with RW 11/4: 56 seconds with AFO and RW 12/30: 53 seconds with AFO and RW     Time  12    Period  Weeks    Status  Partially Met    Target Date  10/20/19      PT LONG TERM GOAL #8   Title  Patient will increase Berg Balance score by > 6 points (38/56) to demonstrate decreased fall risk during functional activities.    Baseline  07/28/19: 32/56     Time  12    Period  Weeks    Status  New    Target Date  10/20/19            Plan - 09/08/19 1431    Clinical Impression Statement  Patient presents with excellent motivation throughout physical therapy session. Occasional seated rest breaks required due to fatigue and forward trunk  lean. Knee hyperextension with fatigue noted.   Patient will continue to benefit from skilled therapy in order to improve dynamic standing balance and increase endurance    Rehab Potential  Fair    Clinical Impairments Affecting Rehab Potential  hx of HTN, HLD, CVA, learning disability, Diabetes, brain tumor,     PT Frequency  2x / week    PT Duration  12 weeks    PT Treatment/Interventions  ADLs/Self Care Home Management;Aquatic Therapy;Ultrasound;Moist Heat;Traction;DME Instruction;Gait training;Stair training;Functional mobility training;Therapeutic activities;Therapeutic exercise;Orthotic Fit/Training;Neuromuscular re-education;Balance training;Patient/family education;Manual techniques;Wheelchair mobility training;Passive range of motion;Energy conservation;Taping;Visual/perceptual remediation/compensation    PT Next Visit Plan  progress standing balance    PT Home Exercise Plan  TrA contraction, glute sets: needs updates. Potentially some time in supine flat in hospital bed, to improve hip extension deficits.     Consulted and Agree with Plan of Care  Patient       Patient will benefit from skilled therapeutic intervention in order  to improve the following deficits and impairments:  Abnormal gait, Decreased activity tolerance, Decreased balance, Decreased knowledge of precautions, Decreased endurance, Decreased coordination, Decreased knowledge of use of DME, Decreased mobility, Decreased range of motion, Difficulty walking, Decreased safety awareness, Decreased strength, Impaired flexibility, Impaired perceived functional ability, Impaired tone, Postural dysfunction, Improper body mechanics, Pain  Visit Diagnosis: Muscle weakness (generalized)  Other lack of coordination  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 Acute  CVA (cerebrovascular accident) ([place]) 05/02/2016  . Left-sided weakness 05/01/2016   Janna Arch, PT, DPT   09/08/2019, 2:32 PM  [place] MAIN [facility] SERVICES [address] [place], [place], [address] Phone: [phone]   Fax:  [phone]  Name: CANDIDO FLOTT MRN: [id number] Date of Birth: November 26, 1965

## 2019-09-08 NOTE — Therapy (Signed)
[place] MAIN [facility] SERVICES [address] [place], [place], [address] Phone: [phone]   Fax:  [phone]  Occupational Therapy Treatment  Patient Details  Name: Curtis Powell MRN: [id number] Date of Birth: 1965/12/08 No data recorded  Encounter Date: 09/08/2019  OT End of Session - 09/08/19 1307    Visit Number  [id number]    Number of Visits  209    Date for OT Re-Evaluation  10/20/19    OT Start Time  1300    OT Stop Time  1345    OT Time Calculation (min)  45 min    Activity Tolerance  Patient tolerated treatment well    Behavior During Therapy  [facility] for tasks assessed/performed       Past Medical History:  Diagnosis Date  . GERD (gastroesophageal reflux disease)   . Hyperlipidemia   . Hypertension   . Obesity   . Stroke [facility])     Past Surgical History:  Procedure Laterality Date  . BRAIN SURGERY      There were no vitals filed for this visit.  Subjective Assessment - 09/08/19 1305    Subjective   Pt. reports that he is feeling good today    Patient is accompanied by:  Family member    Pertinent History  Pt. is a 54 y.o. male who suffered a CVA on 05/01/2016. Pt. was admitted to the hospital. Once discharged, he received Home Health PT and OT services for about a month. Pt. has had multiple CVAs over the past 8 years, and has had multiple falls in the past 6 months. Pt. resides in an apartment. Pt. has caregivers for 80 hours. Pt.'s mother stays with pt. at night, and assists with IADL tasks.      Currently in Pain?  No/denies      OT TREATMENT  Neuromuscular re-ed:  Pt. worked on reaching using the [employer]. Pt. grasped and moved the shapes through 3 progressively higher vertical dowels of varying heights. Pt. Required fewer cues to avoid leaning at the trunk when reaching. Pt. worked on using his right hand during [facility] skills for grasping 1" sticks, 1/4" collars, and 1/4" washers. Pt. worked on storing the 1" sticks in  the palm of his hand. Pt. Worked on grasping, and placing the small 1/4" collars, and washers on the pegboard.  Pt. Worked on removing the washers and collars with alternating thumb opposition to the tip of his 2nd-5th digits and thumb. Pt. worked on removing the pegs using bilateral alternating hand patterns.  Response to Treatment:  Pt. reports that he has to have follow-up imaging on his brain after having an initial scan, and an MD visit. Pt.continues to make progress. Pt. Required less cues today toavoid compensating proximally with hiking the right shoulder, and leaning to the left, or right when reaching.Pt. Required rest breaks during the reaching task. Pt. Required cues to avoid compensating proximally with leaning to the left during [facility] skills.Pt. dropped multiple 1" sticks from his hand when attempting to store them in the palm of his hand.  Pt. continues to work on improving RUE strength, and [facility] skills in order to work towards improving RUE functioning, functional reaching, and [facility] skills during ADLs, and IADL tasks.                       OT Education - 09/08/19 1306    Education provided  Yes    Education Details  HEP, [place], reaching    Person(s) Educated  Patient    Methods  Explanation    Comprehension  Verbalized understanding;Returned demonstration;Verbal cues required    Person(s) Educated  Patient       OT Short Term Goals - 03/03/17 1459      OT SHORT TERM GOAL #1   Title  `        OT Long Term Goals - 07/28/19 1504      OT LONG TERM GOAL #4   Title  Pt. will perform self dressing with minA and A/E as needed.    Baseline  Pt. continues to require assist with donning underwear over feet. Min-ModA donning his jacket. (Depending on the jacket)    Time  12    Period  Weeks    Status  Partially Met    Target Date  10/20/19      Long Term Additional Goals   Additional Long Term Goals  Yes      OT LONG TERM GOAL #6   Title  Pt. will  write his name efficiently with 100% legibility    Baseline  Pt continues to present with limited with 50% legibility and efficiency when writing name to sign in when riding the [place]    Time  12    Period  Weeks    Status  On-going    Target Date  10/20/19      OT LONG TERM GOAL #7   Title  Pt will independently and consistently follow HEP to increase UE strength to increase functional independence    Baseline  Pt. continues to work towards independence with exercises    Time  12    Period  Weeks    Status  On-going    Target Date  10/20/19      OT LONG TERM GOAL #8   Title  Pt. will require supervision ironing a shirt.    Baseline  Pt. has progressed CGA, and complete set-up seated using a tabletop iron    Time  12    Period  Weeks    Status  On-going    Target Date  10/20/19      OT LONG TERM GOAL  #9   Baseline  Pt. will independently be able to sew a button onto a shirt.    Time  12    Period  Weeks    Status  On-going    Target Date  10/20/19      OT LONG TERM GOAL  #10   TITLE  Pt. will independently be able to throw a ball    Baseline  Pt. continues to be limited with throwing overhand, however pt. is able to perform underhand throwing.    Time  12    Period  Weeks    Status  On-going      OT LONG TERM GOAL  #11   TITLE  Pt. will improve UE functional reaching to be able to independently use his RUE to hand his clothes in the closest.    Baseline  Pt. continues to be able to reach for clothing pulling them off of the hangers. Pt. is unable to reach up to place hangers onto the the rack.    Time  12    Period  Weeks    Status  On-going    Target Date  10/20/19      OT LONG TERM GOAL  #13   TITLE  Pt. will increase BUE strength  to be able to prepare for assisting with yardwork.    Baseline  Pt. is unable    Time  12    Period  Weeks    Status  New            Plan - 09/08/19 1307    Clinical Impression Statement  Pt. reports that he has to have follow-up  imaging on his brain after having an initial scan, and an MD visit. Pt.continues to make progress. Pt. Required less cues today toavoid compensating proximally with hiking the right shoulder, and leaning to the left, or right when reaching.Pt. Required rest breaks during the reaching task. Pt. Required cues to avoid compensating proximally with leaning to the left during [facility] skills.Pt. dropped multiple 1" sticks from his hand when attempting to store them in the palm of his hand.  Pt. continues to work on improving RUE strength, and [facility] skills in order to work towards improving RUE functioning, functional reaching, and [facility] skills during ADLs, and IADL tasks.   OT Occupational Profile and History  Problem Focused Assessment - Including review of records relating to presenting problem    Occupational performance deficits (Please refer to evaluation for details):  ADL's;IADL's    Body Structure / Function / Physical Skills  ADL;FMC;ROM;IADL;Pain;Coordination    Rehab Potential  Fair    Clinical Decision Making  Several treatment options, min-mod task modification necessary    Comorbidities Affecting Occupational Performance:  May have comorbidities impacting occupational performance    Modification or Assistance to Complete Evaluation   Min-Moderate modification of tasks or assist with assess necessary to complete eval    OT Frequency  1x / week    OT Duration  12 weeks    OT Treatment/Interventions  Self-care/ADL training;Patient/family education;DME and/or AE instruction;Therapeutic exercise;Neuromuscular education    Consulted and Agree with Plan of Care  Patient       Patient will benefit from skilled therapeutic intervention in order to improve the following deficits and impairments:   Body Structure / Function / Physical Skills: ADL, FMC, ROM, IADL, Pain, Coordination       Visit Diagnosis: Muscle weakness (generalized)  Other lack of coordination    Problem List Patient Active  Problem List   Diagnosis Date Noted  . Acute CVA (cerebrovascular accident) ([place]) 05/02/2016  . Left-sided weakness 05/01/2016    Harrel Carina, MS, OTR/L 09/08/2019, 1:13 PM  [place] MAIN [facility] SERVICES [address] [place], [place], [address] Phone: [phone]   Fax:  [phone]  Name: Curtis Powell MRN: [id number] Date of Birth: Aug 09, 1965

## 2019-09-13 ENCOUNTER — Ambulatory Visit: Payer: Medicare HMO

## 2019-09-13 ENCOUNTER — Other Ambulatory Visit: Payer: Self-pay

## 2019-09-13 ENCOUNTER — Encounter: Payer: Self-pay | Admitting: Occupational Therapy

## 2019-09-13 ENCOUNTER — Ambulatory Visit: Payer: Medicare HMO | Admitting: Occupational Therapy

## 2019-09-13 DIAGNOSIS — R278 Other lack of coordination: Secondary | ICD-10-CM

## 2019-09-13 DIAGNOSIS — M6281 Muscle weakness (generalized): Secondary | ICD-10-CM | POA: Diagnosis not present

## 2019-09-13 DIAGNOSIS — I69351 Hemiplegia and hemiparesis following cerebral infarction affecting right dominant side: Secondary | ICD-10-CM

## 2019-09-13 NOTE — Therapy (Signed)
[place] MAIN [facility] SERVICES [address] [place], [place], [address] Phone: [phone]   Fax:  [phone]  Occupational Therapy Treatment  Patient Details  Name: Curtis Powell MRN: [id number] Date of Birth: [birth year] No data recorded  Encounter Date: 09/13/2019  OT End of Session - 09/13/19 1437    Visit Number  189    Number of Visits  209    Date for OT Re-Evaluation  10/20/19    Authorization Type  Progress reporting period starting 08/02/2019    OT Start Time  1430    OT Stop Time  1515    OT Time Calculation (min)  45 min    Activity Tolerance  Patient tolerated treatment well    Behavior During Therapy  [facility] for tasks assessed/performed       Past Medical History:  Diagnosis Date  . GERD (gastroesophageal reflux disease)   . Hyperlipidemia   . Hypertension   . Obesity   . Stroke [facility])     Past Surgical History:  Procedure Laterality Date  . BRAIN SURGERY      There were no vitals filed for this visit.  Subjective Assessment - 09/13/19 1435    Subjective   Pt. reports 8/10 pain in his right 3rd digit.    Patient is accompanied by:  Family member    Pertinent History  Pt. is a 53 y.o. male who suffered a CVA on 05/01/2016. Pt. was admitted to the hospital. Once discharged, he received Home Health PT and OT services for about a month. Pt. has had multiple CVAs over the past 8 years, and has had multiple falls in the past 6 months. Pt. resides in an apartment. Pt. has caregivers for 80 hours. Pt.'s mother stays with pt. at night, and assists with IADL tasks.      Patient Stated Goals  To be able to throw a ball and dribble a ball, do as much as I can for myself.     Currently in Pain?  Yes    Pain Score  8     Pain Location  Finger (Comment which one)   3rd digit   Pain Orientation  Left    Pain Descriptors / Indicators  Aching    Pain Type  Chronic pain      OT TREATMENT  Neuromuscular re-ed:  Pt. worked on  reaching using the [employer]. Pt.grasped and moved the shapes through3 progressively highervertical dowels of varying heights.Pt. Required fewer cues to avoid leaning at the trunk when reaching.Pt. worked on using his right hand for grasping, and manipulating 1", 3/4", and 1/2" washers from a magnetic dish using 2pt. grasp pattern. Pt. worked on reaching up, stabilizing, and sustaining shoulder elevation while placing the washer over a small precise target on vertical dowels positioned at various angles.   Response to Treatment:  Pt. reports that he has a follow-up appointment with his neurologist next week. Pt. has 8/10 pain in his left 3rd digit. Pt. reports having an orthopedic follow-up appointment on June 30th, 2021. Pt.continues to make progress. Pt. required no cues today toavoid compensating proximally with hiking the right shoulder, and leaning to the left, or right when reaching.Pt. required rest breaks during the reaching tasks. No cues needed to avoid compensating proximally with leaning to the left during [facility] skills.Pt. dropped multiple washers from his right hand when attempting to store them in his hand, and perform translatory movements. Pt.dropped multiple washers from his hand when  attempting to store them in the palm of his hand.Pt. continues to work on improving RUE strength, and [facility] skills in order to work towards improving RUE functioning, functional reaching, and [facility] skillsduring ADLs, and IADL tasks.                         OT Education - 09/13/19 1437    Education provided  Yes    Education Details  HEP, [place], reaching    Person(s) Educated  Patient    Methods  Explanation    Comprehension  Verbalized understanding;Returned demonstration;Verbal cues required       OT Short Term Goals - 03/03/17 1459      OT SHORT TERM GOAL #1   Title  `        OT Long Term Goals - 07/28/19 1504      OT LONG TERM GOAL #4   Title  Pt. will  perform self dressing with minA and A/E as needed.    Baseline  Pt. continues to require assist with donning underwear over feet. Min-ModA donning his jacket. (Depending on the jacket)    Time  12    Period  Weeks    Status  Partially Met    Target Date  10/20/19      Long Term Additional Goals   Additional Long Term Goals  Yes      OT LONG TERM GOAL #6   Title  Pt. will write his name efficiently with 100% legibility    Baseline  Pt continues to present with limited with 50% legibility and efficiency when writing name to sign in when riding the [place]    Time  12    Period  Weeks    Status  On-going    Target Date  10/20/19      OT LONG TERM GOAL #7   Title  Pt will independently and consistently follow HEP to increase UE strength to increase functional independence    Baseline  Pt. continues to work towards independence with exercises    Time  12    Period  Weeks    Status  On-going    Target Date  10/20/19      OT LONG TERM GOAL #8   Title  Pt. will require supervision ironing a shirt.    Baseline  Pt. has progressed CGA, and complete set-up seated using a tabletop iron    Time  12    Period  Weeks    Status  On-going    Target Date  10/20/19      OT LONG TERM GOAL  #9   Baseline  Pt. will independently be able to sew a button onto a shirt.    Time  12    Period  Weeks    Status  On-going    Target Date  10/20/19      OT LONG TERM GOAL  #10   TITLE  Pt. will independently be able to throw a ball    Baseline  Pt. continues to be limited with throwing overhand, however pt. is able to perform underhand throwing.    Time  12    Period  Weeks    Status  On-going      OT LONG TERM GOAL  #11   TITLE  Pt. will improve UE functional reaching to be able to independently use his RUE to hand his clothes in the closest.    Baseline  Pt.  continues to be able to reach for clothing pulling them off of the hangers. Pt. is unable to reach up to place hangers onto the the rack.     Time  12    Period  Weeks    Status  On-going    Target Date  10/20/19      OT LONG TERM GOAL  #13   TITLE  Pt. will increase BUE strength to be able to prepare for assisting with yardwork.    Baseline  Pt. is unable    Time  12    Period  Weeks    Status  New            Plan - 09/13/19 1437    Clinical Impression Statement  Pt. reports that he has a follow-up appointment with his neurologist next week. Pt. has 8/10 pain in his left 3rd digit. Pt. reports having an orthopedic follow-up appointment on June 30th, 2021. Pt.continues to make progress. Pt. required no cues today toavoid compensating proximally with hiking the right shoulder, and leaning to the left, or right when reaching.Pt. required rest breaks during the reaching tasks. No cues needed to avoid compensating proximally with leaning to the left during [facility] skills.Pt. Did drop multiple washers from his right hand when attempting to store them in his hand, and perfrom translatory movement. Pt.dropped multiple washers from his hand when attempting to store them in the palm of his hand.Pt. continues to work on improving RUE strength, and [facility] skills in order to work towards improving RUE functioning, functional reaching, and [facility] skillsduring ADLs, and IADL tasks.     OT Occupational Profile and History  Problem Focused Assessment - Including review of records relating to presenting problem    Occupational performance deficits (Please refer to evaluation for details):  ADL's;IADL's    Body Structure / Function / Physical Skills  ADL;FMC;ROM;IADL;Pain;Coordination    Rehab Potential  Fair    Clinical Decision Making  Several treatment options, min-mod task modification necessary    Comorbidities Affecting Occupational Performance:  May have comorbidities impacting occupational performance    Modification or Assistance to Complete Evaluation   Min-Moderate modification of tasks or assist with assess necessary to complete eval     OT Frequency  1x / week    OT Duration  12 weeks    OT Treatment/Interventions  Self-care/ADL training;Patient/family education;DME and/or AE instruction;Therapeutic exercise;Neuromuscular education    Consulted and Agree with Plan of Care  Patient       Patient will benefit from skilled therapeutic intervention in order to improve the following deficits and impairments:   Body Structure / Function / Physical Skills: ADL, FMC, ROM, IADL, Pain, Coordination       Visit Diagnosis: Muscle weakness (generalized)  Other lack of coordination    Problem List Patient Active Problem List   Diagnosis Date Noted  . Acute CVA (cerebrovascular accident) ([place]) 05/02/2016  . Left-sided weakness 05/01/2016    Harrel Carina, MS, OTR/L 09/13/2019, 2:39 PM  [place] MAIN [facility] SERVICES [address] [place], [place], [address] Phone: [phone]   Fax:  [phone]  Name: Curtis Powell MRN: [id number] Date of Birth: May 05, 1965

## 2019-09-14 NOTE — Therapy (Signed)
[place] MAIN [facility] SERVICES [address] [place], [place], [address] Phone: [phone]   Fax:  [phone]  Physical Therapy Treatment  Patient Details  Name: Curtis Powell MRN: [id number] Date of Birth: October 31, 1965 No data recorded  Encounter Date: 09/13/2019  PT End of Session - 09/14/19 0744    Visit Number  [id number]    Number of Visits  210    Date for PT Re-Evaluation  10/20/19    Authorization Type  5/10 PN 08/11/19    Authorization Time Period  authorized 11/16/18-05/19/19    PT Start Time  1345    PT Stop Time  1430    PT Time Calculation (min)  45 min    Equipment Utilized During Treatment  Gait belt    Activity Tolerance  Patient tolerated treatment well;No increased pain    Behavior During Therapy  WFL for tasks assessed/performed       Past Medical History:  Diagnosis Date  . GERD (gastroesophageal reflux disease)   . Hyperlipidemia   . Hypertension   . Obesity   . Stroke [facility])     Past Surgical History:  Procedure Laterality Date  . BRAIN SURGERY      There were no vitals filed for this visit.  Subjective Assessment - 09/14/19 0741    Subjective  Pt has been walking with aide.  Reports no falls or problems since his last PT visit.    Pertinent History   Patient is a pleasant 54 year old male who presents to physical therapy for weakness and immobility secondary to CVA.  Had a stroke in Feb 2018. He was previously fully independent, but this stroke caused severe residual deficits, mainly on the right side as well as speech, and now he is unable to walk or perform most of his ADLs on his own. Entire right side is very weak. He still has a little difficulty with speech but his swallowing is improved to baseline. His mom had to move in with him and now is his main caregiver.     Limitations  Sitting;Lifting;Standing;Walking;House hold activities    How long can you sit comfortably?  5 minutes    How long can you stand  comfortably?  5 minutes    How long can you walk comfortably?  10 ft    Patient Stated Goals  Walk without walker, walk further with walker, get some strength in back     Currently in Pain?  No/denies       Treatment:  Nustep lvl 3-4 RPM> 70 4 minutes for cardiovascular support/challenge.   In // bars: close CGA with cueing for stability - forward ambulation with cues for upright posture, heel strike, and step length; then backwards ambulation length back to chair 3x (6 lengths total); very challenging with upright posture -static stand 60 seconds -lateral stepping 2x length of // bars.   ambulate 150 ft x 2 with CGA with RW, cues for foot clearance, upright posture, tactile cueing to PSIS and ASIS for body mechanics and sequencing; x 2 trialswith one seated rest break  Narrow BOS on airex pad in // bars with hands hovering above bars; alt toe tapping onto edge of airex pad in // bars with fingertip hold.   Patient requires seated rest breaks between standing interventions due to fatigue, shortness of breath, and elevated HR with exercise.    Pt educated throughout session about proper posture and technique with exercises. Improved exercise technique, movement at  target joints, use of target muscles after min to mod verbal, visual, tactile cues.                          PT Education - 09/14/19 [phone]    Education provided  Yes    Education Details  exercise technique; body mechanics    Person(s) Educated  Patient    Methods  Explanation;Demonstration;Tactile cues;Verbal cues    Comprehension  Verbalized understanding;Returned demonstration;Verbal cues required;Tactile cues required       PT Short Term Goals - 04/07/19 1504      PT SHORT TERM GOAL #1   Title  Patient will be independent in home exercise program to improve strength/mobility for better functional independence with ADLs.    Baseline  hep compliant    Time  2    Period  Weeks     Status  Achieved      PT SHORT TERM GOAL #2   Title  Patient will require min cueing for STS transfer with CGA for increased independence with mobility.     Baseline  CGA    Time  2    Period  Weeks    Status  Achieved      PT SHORT TERM GOAL #3   Title  Patient will maintain upright posture for > 15 seconds to demonstrate strengthened postural control muscualture     Baseline  18 seconds     Time  2    Period  Weeks    Status  Achieved        PT Long Term Goals - 07/28/19 0001      PT LONG TERM GOAL #1   Title  Patient will perform bed mobility (rolling and supine <>sit) with Mod I  to increase mobility and  perform ADLs.     Baseline  4/21: unable to perform     Time  12    Period  Weeks    Status  New    Target Date  10/20/19      PT LONG TERM GOAL #2   Title  Patient (< 28 years old) will complete five times sit to stand test in < 10 seconds indicating an increased LE strength and improved balance.    Baseline  4/21: 18.8 seconds 06/02/19: 19.93 seconds one hand on RW, one hand on w/c 12/30: 16 secpmds BUE support 02/11/19: 36 seconds 5/19: 20.6s from [facility], one hand on walker and one on WC, 80% upright posture 7/1: 22 seconds one hand on walker one hand on w/c  9/14: 48 seconds with full upright posture    Time  12    Period  Weeks    Status  Partially Met    Target Date  10/20/19      PT LONG TERM GOAL #3   Title  Patient will increase Berg Balance score by > 6 points (26/56) to demonstrate decreased fall risk during functional activities.    Baseline  4/21: 32/56 06/02/19: 28/56 12/23: 25/56 9/12: 20/56 10/15: 21/56 11/7: 20/56 12/19: 22/56; 08/26/18: 11/56 7/1: 17/56  9/14: 23/56, 07/06/19=28/56    Time  8    Period  Weeks    Status  Achieved      PT LONG TERM GOAL #4   Title  Patient will increase BLE gross strength to 4+/5 as to improve functional strength for independent gait, increased standing tolerance and increased ADL ability.    Baseline   4/21:  see note  06/02/19: hip flexion 4-/5, extension/abd 3/5 12/30: hip flexion 4/5 hip extension and abduction 3/5 02/11/19: hip flexion 4/5 hip extension and abduction 2+/54-/5 RLE, 10/15: 4/5 RLE 11/7: 4/5 RLE 3/3: 4/5 7/1:  hip flexion 4/5 hip extension 2/5 hip abduction/adduciton 2/5     Time  12    Period  Weeks    Status  Partially Met    Target Date  09/22/19      PT LONG TERM GOAL #5   Title  Patient will increase lower extremity functional scale to >40/80 to demonstrate improved functional mobility and increased tolerance with ADLs.     Baseline  4/21: terminated due to cognition 06/02/19: 31/80 12/30: 35/80 02/10/19: 34/56 14/80; 12/26: 21/80; 2/18: 28/80  4/10; 25/80 5/29: 27/80; 7/16: 29/80 8/22: 30/80  9/12: 26/80 (feeling more tired due to moving) 10/15: 37/80 11/7: 23/80 3/3: 32/80; 08/25/18: 28/80 9/15: 32/56    Status  Deferred      PT LONG TERM GOAL #6   Title  Patient will ambulate 200 ft with least assistive device and Supervision with no breaks to allow for increased mobility within home.    Baseline  4/21: ambulate 150 ft     Time  12    Period  Weeks    Status  New    Target Date  10/20/19      PT LONG TERM GOAL #7   Title  Patient will perform 10 MWT in <30 seconds for improved gait speed, gait mechanics, and functional capacity for mobility.     Baseline   07/28/19: 51 seconds with RW 06/02/19: 62.5 seconds with RW 11/4: 56 seconds with AFO and RW 12/30: 53 seconds with AFO and RW     Time  12    Period  Weeks    Status  Partially Met    Target Date  10/20/19      PT LONG TERM GOAL #8   Title  Patient will increase Berg Balance score by > 6 points (38/56) to demonstrate decreased fall risk during functional activities.    Baseline  07/28/19: 32/56     Time  12    Period  Weeks    Status  New    Target Date  10/20/19            Plan - 09/14/19 0745    Clinical Impression Statement  Pt very pleasant and motivated throughout therapy session.  He needed some seated rest  breaks throughout session due to fatigue.  Will continue to work on improved dynamic standing balance and endurance at next PT visit.    Rehab Potential  Fair    Clinical Impairments Affecting Rehab Potential  hx of HTN, HLD, CVA, learning disability, Diabetes, brain tumor,     PT Frequency  2x / week    PT Duration  12 weeks    PT Treatment/Interventions  ADLs/Self Care Home Management;Aquatic Therapy;Ultrasound;Moist Heat;Traction;DME Instruction;Gait training;Stair training;Functional mobility training;Therapeutic activities;Therapeutic exercise;Orthotic Fit/Training;Neuromuscular re-education;Balance training;Patient/family education;Manual techniques;Wheelchair mobility training;Passive range of motion;Energy conservation;Taping;Visual/perceptual remediation/compensation    PT Next Visit Plan  progress standing balance    PT Home Exercise Plan  TrA contraction, glute sets: needs updates. Potentially some time in supine flat in hospital bed, to improve hip extension deficits.     Consulted and Agree with Plan of Care  Patient       Patient will benefit from skilled therapeutic intervention in order to improve the following deficits and impairments:  Abnormal  gait, Decreased activity tolerance, Decreased balance, Decreased knowledge of precautions, Decreased endurance, Decreased coordination, Decreased knowledge of use of DME, Decreased mobility, Decreased range of motion, Difficulty walking, Decreased safety awareness, Decreased strength, Impaired flexibility, Impaired perceived functional ability, Impaired tone, Postural dysfunction, Improper body mechanics, Pain  Visit Diagnosis: Muscle weakness (generalized)  Other lack of coordination  Hemiplegia and hemiparesis following cerebral infarction affecting right dominant side [facility])     Problem List Patient Active Problem List   Diagnosis Date Noted  . Acute CVA (cerebrovascular accident) ([place]) 05/02/2016  . Left-sided weakness  05/01/2016    Kera Deacon, MPT 09/14/2019, 7:48 AM  [place] MAIN [facility] SERVICES [address] [place], [place], [address] Phone: [phone]   Fax:  [phone]  Name: Curtis Powell MRN: [id number] Date of Birth: Jan 03, 1966

## 2019-09-15 ENCOUNTER — Other Ambulatory Visit: Payer: Self-pay

## 2019-09-15 ENCOUNTER — Ambulatory Visit: Payer: Medicare HMO

## 2019-09-15 ENCOUNTER — Encounter: Payer: Self-pay | Admitting: Occupational Therapy

## 2019-09-15 ENCOUNTER — Ambulatory Visit: Payer: Medicare HMO | Admitting: Occupational Therapy

## 2019-09-15 DIAGNOSIS — R278 Other lack of coordination: Secondary | ICD-10-CM

## 2019-09-15 DIAGNOSIS — M6281 Muscle weakness (generalized): Secondary | ICD-10-CM

## 2019-09-15 DIAGNOSIS — I69351 Hemiplegia and hemiparesis following cerebral infarction affecting right dominant side: Secondary | ICD-10-CM

## 2019-09-15 DIAGNOSIS — R2689 Other abnormalities of gait and mobility: Secondary | ICD-10-CM

## 2019-09-15 NOTE — Therapy (Signed)
[place] MAIN [facility] SERVICES [address] [place], [place], [address] Phone: [phone]   Fax:  [phone]  Physical Therapy Treatment  Patient Details  Name: Curtis Powell MRN: [id number] Date of Birth: 1965/10/04 No data recorded  Encounter Date: 09/15/2019    Past Medical History:  Diagnosis Date  . GERD (gastroesophageal reflux disease)   . Hyperlipidemia   . Hypertension   . Obesity   . Stroke [facility])     Past Surgical History:  Procedure Laterality Date  . BRAIN SURGERY      There were no vitals filed for this visit.             ambulate 190 ft with CGA with RW, cues for foot clearance, upright posture, tactile cueing to PSIS and ASIS for body mechanics and sequencing; x 2 trials with one seated rest break  Standing with RW: 5lb ankle weights: -marching 12x each LE, Min A to RW for stabilization, cues for upright posture -hip extension/step back 10x each LE, cues for upright posture.   Standing without UE support 45 seconds no LOB Standing without UE support head turns 10x horizontal, 10x vertical   Seated:  5lb ankle weights:  -marching 15x each LE, cues for increasing arc of motion; single LE at a time.  -LAQ 15x each LE,  Punching cross body to targets in varying planes of motion x2 minutes; cues for upright posture   Seated balloon taps reaching inside/outside BOS    Patient requires seated rest breaks between standing interventions due to fatigue, shortness of breath, and elevated HR with exercise.        Pt educated throughout session about proper posture and technique with exercises. Improved exercise technique, movement at target joints, use of target muscles after min to mod verbal, visual, tactile cues.                     PT Short Term Goals - 04/07/19 1504      PT SHORT TERM GOAL #1   Title  Patient will be independent in home exercise program to improve strength/mobility for  better functional independence with ADLs.    Baseline  hep compliant    Time  2    Period  Weeks    Status  Achieved      PT SHORT TERM GOAL #2   Title  Patient will require min cueing for STS transfer with CGA for increased independence with mobility.     Baseline  CGA    Time  2    Period  Weeks    Status  Achieved      PT SHORT TERM GOAL #3   Title  Patient will maintain upright posture for > 15 seconds to demonstrate strengthened postural control muscualture     Baseline  18 seconds     Time  2    Period  Weeks    Status  Achieved        PT Long Term Goals - 07/28/19 0001      PT LONG TERM GOAL #1   Title  Patient will perform bed mobility (rolling and supine <>sit) with Mod I  to increase mobility and  perform ADLs.     Baseline  4/21: unable to perform     Time  12    Period  Weeks    Status  New    Target Date  10/20/19      PT LONG  TERM GOAL #2   Title  Patient (< 108 years old) will complete five times sit to stand test in < 10 seconds indicating an increased LE strength and improved balance.    Baseline  4/21: 18.8 seconds 06/02/19: 19.93 seconds one hand on RW, one hand on w/c 12/30: 16 secpmds BUE support 02/11/19: 36 seconds 5/19: 20.6s from [facility], one hand on walker and one on WC, 80% upright posture 7/1: 22 seconds one hand on walker one hand on w/c  9/14: 48 seconds with full upright posture    Time  12    Period  Weeks    Status  Partially Met    Target Date  10/20/19      PT LONG TERM GOAL #3   Title  Patient will increase Berg Balance score by > 6 points (26/56) to demonstrate decreased fall risk during functional activities.    Baseline  4/21: 32/56 06/02/19: 28/56 12/23: 25/56 9/12: 20/56 10/15: 21/56 11/7: 20/56 12/19: 22/56; 08/26/18: 11/56 7/1: 17/56  9/14: 23/56, 07/06/19=28/56    Time  8    Period  Weeks    Status  Achieved      PT LONG TERM GOAL #4   Title  Patient will increase BLE gross strength to 4+/5 as to improve functional strength for  independent gait, increased standing tolerance and increased ADL ability.    Baseline   4/21: see note 06/02/19: hip flexion 4-/5, extension/abd 3/5 12/30: hip flexion 4/5 hip extension and abduction 3/5 02/11/19: hip flexion 4/5 hip extension and abduction 2+/54-/5 RLE, 10/15: 4/5 RLE 11/7: 4/5 RLE 3/3: 4/5 7/1:  hip flexion 4/5 hip extension 2/5 hip abduction/adduciton 2/5     Time  12    Period  Weeks    Status  Partially Met    Target Date  09/22/19      PT LONG TERM GOAL #5   Title  Patient will increase lower extremity functional scale to >40/80 to demonstrate improved functional mobility and increased tolerance with ADLs.     Baseline  4/21: terminated due to cognition 06/02/19: 31/80 12/30: 35/80 02/10/19: 34/56 14/80; 12/26: 21/80; 2/18: 28/80  4/10; 25/80 5/29: 27/80; 7/16: 29/80 8/22: 30/80  9/12: 26/80 (feeling more tired due to moving) 10/15: 37/80 11/7: 23/80 3/3: 32/80; 08/25/18: 28/80 9/15: 32/56    Status  Deferred      PT LONG TERM GOAL #6   Title  Patient will ambulate 200 ft with least assistive device and Supervision with no breaks to allow for increased mobility within home.    Baseline  4/21: ambulate 150 ft     Time  12    Period  Weeks    Status  New    Target Date  10/20/19      PT LONG TERM GOAL #7   Title  Patient will perform 10 MWT in <30 seconds for improved gait speed, gait mechanics, and functional capacity for mobility.     Baseline   07/28/19: 51 seconds with RW 06/02/19: 62.5 seconds with RW 11/4: 56 seconds with AFO and RW 12/30: 53 seconds with AFO and RW     Time  12    Period  Weeks    Status  Partially Met    Target Date  10/20/19      PT LONG TERM GOAL #8   Title  Patient will increase Berg Balance score by > 6 points (38/56) to demonstrate decreased fall risk during functional activities.  Baseline  07/28/19: 32/56     Time  12    Period  Weeks    Status  New    Target Date  10/20/19              Patient will benefit from skilled  therapeutic intervention in order to improve the following deficits and impairments:     Visit Diagnosis: No diagnosis found.     Problem List Patient Active Problem List   Diagnosis Date Noted  . Acute CVA (cerebrovascular accident) ([place]) 05/02/2016  . Left-sided weakness 05/01/2016   Janna Arch, PT, DPT   09/15/2019, 2:10 PM  [place] MAIN [facility] SERVICES [address] [place], [place], [address] Phone: [phone]   Fax:  [phone]  Name: Curtis Powell MRN: [id number] Date of Birth: August 03, 1965

## 2019-09-15 NOTE — Therapy (Signed)
[place] MAIN [facility] SERVICES [address] [place], [place], [address] Phone: [phone]   Fax:  [phone]  Occupational Therapy Progress Note  Dates of reporting period  08/02/2019  to  09/15/2019  Patient Details  Name: Curtis Powell MRN: [id number] Date of Birth: 1965/09/17 No data recorded  Encounter Date: 09/15/2019  OT End of Session - 09/15/19 1435    Visit Number  [id number]    Number of Visits  209    Date for OT Re-Evaluation  10/20/19    Authorization Type  Progress reporting period starting 08/02/2019    OT Start Time  1435    OT Stop Time  1515    OT Time Calculation (min)  40 min    Activity Tolerance  Patient tolerated treatment well    Behavior During Therapy  [facility] for tasks assessed/performed       Past Medical History:  Diagnosis Date  . GERD (gastroesophageal reflux disease)   . Hyperlipidemia   . Hypertension   . Obesity   . Stroke [facility])     Past Surgical History:  Procedure Laterality Date  . BRAIN SURGERY      There were no vitals filed for this visit.  Subjective Assessment - 09/15/19 1435    Subjective   Pt. reports 8/10 pain in his right 3rd digit.    Patient is accompanied by:  Family member    Pertinent History  Pt. is a 54 y.o. male who suffered a CVA on 05/01/2016. Pt. was admitted to the hospital. Once discharged, he received Home Health PT and OT services for about a month. Pt. has had multiple CVAs over the past 8 years, and has had multiple falls in the past 6 months. Pt. resides in an apartment. Pt. has caregivers for 80 hours. Pt.'s mother stays with pt. at night, and assists with IADL tasks.      Currently in Pain?  No/denies         [facility] OT Assessment - 09/15/19 1438      Coordination   Right 9 Hole Peg Test  51    Left 9 Hole Peg Test  46      Hand Function   Right Hand Grip (lbs)  40    Right Hand Lateral Pinch  20 lbs    Right Hand 3 Point Pinch  14 lbs    Left Hand Grip (lbs)  27    Left Hand Lateral Pinch  15 lbs    Left 3 point pinch  11 lbs      Measurements were obtained, and goals were reviewed with the pt.  The patient continues to make progress, and is engaging his UEs more during ADL, and IADL tasks at home including shaving with set-up using an electric razer, grooming tasks, folding laundry, washing dishes, and reaching. Pt. Continues to work on improving UE functional reaching to be able to reach into his closet, donning LE clothing over his feet, donning a jacket, throwing a ball, writing legibly, ironing, and sewing a button. Pt. Works on these skills in order to work towards maximizing overall independence with ADLs, and IADLs.                 OT Education - 09/15/19 1435    Education provided  Yes    Education Details  HEP, [place], reaching    Person(s) Educated  Patient    Methods  Explanation    Comprehension  Verbalized understanding;Returned  demonstration;Verbal cues required       OT Short Term Goals - 03/03/17 1459      OT SHORT TERM GOAL #1   Title  `        OT Long Term Goals - 09/15/19 1451      OT LONG TERM GOAL #4   Title  Pt. will perform self dressing with minA and A/E as needed.    Baseline  Pt. continues to require assist with donning underwear over feet. Min-ModA donning his jacket. (Depending on the jacket)    Time  12    Period  Weeks    Status  Partially Met    Target Date  10/20/19      OT LONG TERM GOAL #5   Title  Pt. will perform light home making tasks with minA    Baseline  Pt is able to wash dishes while seated in w/c with increased accessibility of new apartment. Pt. is able to fold his clothes, his moms puts them in the and out of the washer, and dryer, and puts them onto hangers. Caregivers perform vacuuming, and dusting.    Time  12    Period  Weeks    Status  Achieved      OT LONG TERM GOAL #6   Title  Pt. will write his name efficiently with 100% legibility    Baseline  Pt continues to present  with limited with 50% legibility and efficiency when writing name to sign in when riding the [place]    Time  12    Period  Weeks    Status  On-going    Target Date  10/20/19      OT LONG TERM GOAL #7   Title  Pt will independently and consistently follow HEP to increase UE strength to increase functional independence    Baseline  Pt. continues to work towards independence with exercises    Time  12    Period  Weeks    Status  On-going    Target Date  10/20/19      OT LONG TERM GOAL #8   Title  Pt. will require supervision ironing a shirt.    Baseline  Pt. has progressed CGA, and complete set-up seated using a tabletop iron    Time  12    Period  Weeks    Status  On-going    Target Date  10/20/19      OT LONG TERM GOAL  #9   Baseline  Pt. will independently be able to sew a button onto a shirt.    Time  12    Period  Weeks    Status  On-going    Target Date  10/20/19      OT LONG TERM GOAL  #10   TITLE  Pt. will independently be able to throw a ball    Baseline  Pt. continues to be limited with throwing overhand, however pt. is able to perform underhand throwing.    Time  12    Period  Weeks    Status  On-going    Target Date  10/20/19      OT LONG TERM GOAL  #11   TITLE  Pt. will improve UE functional reaching to be able to independently use his RUE to hand his clothes in the closest.    Baseline  Pt. continues to be able to reach for clothing pulling them off of the hangers. Pt. is unable to reach up  to place hangers onto the the rack.    Time  12    Period  Weeks    Status  On-going    Target Date  10/20/19      OT LONG TERM GOAL  #13   TITLE  Pt. will increase BUE strength to be able to prepare for assisting with yardwork.    Baseline  Pt. reports that he has been unable to get out into the yard.    Time  12    Period  Weeks    Status  On-going            Plan - 09/15/19 1436    Clinical Impression Statement  The patient continues to make progress, and is  engaging his UEs more during ADL, and IADL tasks at home including shaving with set-up using an electric razer, grooming tasks, folding laundry, washing dishes, and reaching. Pt. Continues to work on improving UE functional reaching to be able to reach into his closet, donning LE clothing over his feet, donning a jacket, throwing a ball, writing legibly, ironing, and sewing a button. Pt. Works on these skills in order to work towards maximizing overall independence with ADLs, and IADLs.   OT Occupational Profile and History  Problem Focused Assessment - Including review of records relating to presenting problem    Occupational performance deficits (Please refer to evaluation for details):  ADL's;IADL's    Body Structure / Function / Physical Skills  ADL;FMC;ROM;IADL;Pain;Coordination    Rehab Potential  Fair    Clinical Decision Making  Several treatment options, min-mod task modification necessary    Comorbidities Affecting Occupational Performance:  May have comorbidities impacting occupational performance    Modification or Assistance to Complete Evaluation   Min-Moderate modification of tasks or assist with assess necessary to complete eval    OT Frequency  1x / week    OT Duration  12 weeks    OT Treatment/Interventions  Self-care/ADL training;Patient/family education;DME and/or AE instruction;Therapeutic exercise;Neuromuscular education    Consulted and Agree with Plan of Care  Patient       Patient will benefit from skilled therapeutic intervention in order to improve the following deficits and impairments:   Body Structure / Function / Physical Skills: ADL, FMC, ROM, IADL, Pain, Coordination       Visit Diagnosis: Muscle weakness (generalized)  Other lack of coordination    Problem List Patient Active Problem List   Diagnosis Date Noted  . Acute CVA (cerebrovascular accident) ([place]) 05/02/2016  . Left-sided weakness 05/01/2016    Harrel Carina, MS, OTR/L 09/15/2019, 4:32  PM  [place] MAIN [facility] SERVICES [address] [place], [place], [address] Phone: [phone]   Fax:  [phone]  Name: Curtis Powell MRN: [id number] Date of Birth: [birth year]

## 2019-09-20 ENCOUNTER — Other Ambulatory Visit: Payer: Self-pay

## 2019-09-20 ENCOUNTER — Encounter: Payer: Self-pay | Admitting: Occupational Therapy

## 2019-09-20 ENCOUNTER — Ambulatory Visit: Payer: Medicare HMO

## 2019-09-20 ENCOUNTER — Ambulatory Visit: Payer: Medicare HMO | Admitting: Occupational Therapy

## 2019-09-20 DIAGNOSIS — R278 Other lack of coordination: Secondary | ICD-10-CM

## 2019-09-20 DIAGNOSIS — M6281 Muscle weakness (generalized): Secondary | ICD-10-CM

## 2019-09-20 DIAGNOSIS — I69351 Hemiplegia and hemiparesis following cerebral infarction affecting right dominant side: Secondary | ICD-10-CM

## 2019-09-20 DIAGNOSIS — R2689 Other abnormalities of gait and mobility: Secondary | ICD-10-CM

## 2019-09-20 NOTE — Therapy (Signed)
[place] MAIN [facility] SERVICES [address] [place], [place], [address] Phone: [phone]   Fax:  [phone]  Physical Therapy Treatment  Patient Details  Name: Curtis Powell MRN: [id number] Date of Birth: 03-22-1966 No data recorded  Encounter Date: 09/20/2019   PT End of Session - 09/20/19 1341    Visit Number 198    Number of Visits 210    Date for PT Re-Evaluation 10/20/19    Authorization Type 8/10 PN 08/11/19    Authorization Time Period authorized 11/16/18-05/19/19    PT Start Time 1344    PT Stop Time 1429    PT Time Calculation (min) 45 min    Equipment Utilized During Treatment Gait belt    Activity Tolerance Patient tolerated treatment well;No increased pain    Behavior During Therapy WFL for tasks assessed/performed           Past Medical History:  Diagnosis Date  . GERD (gastroesophageal reflux disease)   . Hyperlipidemia   . Hypertension   . Obesity   . Stroke [facility])     Past Surgical History:  Procedure Laterality Date  . BRAIN SURGERY      There were no vitals filed for this visit.   Subjective Assessment - 09/20/19 1348    Subjective Patient reports he is going to get to go a music festival in [place] with his mom in august, is very excited since it is his first trip in [place] years. No falls or LOB since last session.    Pertinent History  Patient is a pleasant 54 year old male who presents to physical therapy for weakness and immobility secondary to CVA.  Had a stroke in Feb 2018. He was previously fully independent, but this stroke caused severe residual deficits, mainly on the right side as well as speech, and now he is unable to walk or perform most of his ADLs on his own. Entire right side is very weak. He still has a little difficulty with speech but his swallowing is improved to baseline. His mom had to move in with him and now is his main caregiver.     Limitations Sitting;Lifting;Standing;Walking;House  hold activities    How long can you sit comfortably? 5 minutes    How long can you stand comfortably? 5 minutes    How long can you walk comfortably? 10 ft    Patient Stated Goals Walk without walker, walk further with walker, get some strength in back     Currently in Pain? No/denies                Treatment:   Nustep lvl 3-4 RPM> 70 4 minutes for cardiovascular support/challenge. ; rest break afterwards due to high HR  (121)       ambulate 100 ft x CGA with RW, cues for foot clearance, upright posture, tactile cueing to PSIS and ASIS for body mechanics and sequencing; terminated after 100 ft due to HR >130   Standing with RW:  -no UE support static stand 30 seconds x 2 trials with dual task of counting by 5's.  -standing marches without ankle weights: 20x each LE, focus on upright posture. ; very challenging with increased number of repetition   Seated: Seated balloon taps reaching inside/outside BOS for coordination, spatial awareness, x 3 minutes Seated soccer ball kicks for coordination, sequencing of muscle activation, timing x 4 minutes  GTB around ankles, alternating LAQ 10x each LE   GTB ER/IR  15x each LE, alternating LE's  Patient requires seated rest breaks between standing interventions due to fatigue, shortness of breath, and elevated HR with exercise.        Pt educated throughout session about proper posture and technique with exercises. Improved exercise technique, movement at target joints, use of target muscles after min to mod verbal, visual, tactile cues.                       PT Education - 09/20/19 1341    Education provided Yes    Education Details exercise technique, body mechanics    Person(s) Educated Patient    Methods Explanation;Demonstration;Tactile cues;Verbal cues    Comprehension Verbalized understanding;Returned demonstration;Verbal cues required;Tactile cues required            PT Short Term Goals - 04/07/19 1504       PT SHORT TERM GOAL #1   Title Patient will be independent in home exercise program to improve strength/mobility for better functional independence with ADLs.    Baseline hep compliant    Time 2    Period Weeks    Status Achieved      PT SHORT TERM GOAL #2   Title Patient will require min cueing for STS transfer with CGA for increased independence with mobility.     Baseline CGA    Time 2    Period Weeks    Status Achieved      PT SHORT TERM GOAL #3   Title Patient will maintain upright posture for > 15 seconds to demonstrate strengthened postural control muscualture     Baseline 18 seconds     Time 2    Period Weeks    Status Achieved             PT Long Term Goals - 07/28/19 0001      PT LONG TERM GOAL #1   Title Patient will perform bed mobility (rolling and supine <>sit) with Mod I  to increase mobility and  perform ADLs.     Baseline 4/21: unable to perform     Time 12    Period Weeks    Status New    Target Date 10/20/19      PT LONG TERM GOAL #2   Title Patient (< 61 years old) will complete five times sit to stand test in < 10 seconds indicating an increased LE strength and improved balance.    Baseline 4/21: 18.8 seconds 06/02/19: 19.93 seconds one hand on RW, one hand on w/c 12/30: 16 secpmds BUE support 02/11/19: 36 seconds 5/19: 20.6s from [facility], one hand on walker and one on WC, 80% upright posture 7/1: 22 seconds one hand on walker one hand on w/c  9/14: 48 seconds with full upright posture    Time 12    Period Weeks    Status Partially Met    Target Date 10/20/19      PT LONG TERM GOAL #3   Title Patient will increase Berg Balance score by > 6 points (26/56) to demonstrate decreased fall risk during functional activities.    Baseline 4/21: 32/56 06/02/19: 28/56 12/23: 25/56 9/12: 20/56 10/15: 21/56 11/7: 20/56 12/19: 22/56; 08/26/18: 11/56 7/1: 17/56  9/14: 23/56, 07/06/19=28/56    Time 8    Period Weeks    Status Achieved      PT LONG TERM GOAL #4    Title Patient will increase BLE gross strength to 4+/5 as to improve functional  strength for independent gait, increased standing tolerance and increased ADL ability.    Baseline  4/21: see note 06/02/19: hip flexion 4-/5, extension/abd 3/5 12/30: hip flexion 4/5 hip extension and abduction 3/5 02/11/19: hip flexion 4/5 hip extension and abduction 2+/54-/5 RLE, 10/15: 4/5 RLE 11/7: 4/5 RLE 3/3: 4/5 7/1:  hip flexion 4/5 hip extension 2/5 hip abduction/adduciton 2/5     Time 12    Period Weeks    Status Partially Met    Target Date 09/22/19      PT LONG TERM GOAL #5   Title Patient will increase lower extremity functional scale to >40/80 to demonstrate improved functional mobility and increased tolerance with ADLs.     Baseline 4/21: terminated due to cognition 06/02/19: 31/80 12/30: 35/80 02/10/19: 34/56 14/80; 12/26: 21/80; 2/18: 28/80  4/10; 25/80 5/29: 27/80; 7/16: 29/80 8/22: 30/80  9/12: 26/80 (feeling more tired due to moving) 10/15: 37/80 11/7: 23/80 3/3: 32/80; 08/25/18: 28/80 9/15: 32/56    Status Deferred      PT LONG TERM GOAL #6   Title Patient will ambulate 200 ft with least assistive device and Supervision with no breaks to allow for increased mobility within home.    Baseline 4/21: ambulate 150 ft     Time 12    Period Weeks    Status New    Target Date 10/20/19      PT LONG TERM GOAL #7   Title Patient will perform 10 MWT in <30 seconds for improved gait speed, gait mechanics, and functional capacity for mobility.     Baseline  07/28/19: 51 seconds with RW 06/02/19: 62.5 seconds with RW 11/4: 56 seconds with AFO and RW 12/30: 53 seconds with AFO and RW     Time 12    Period Weeks    Status Partially Met    Target Date 10/20/19      PT LONG TERM GOAL #8   Title Patient will increase Berg Balance score by > 6 points (38/56) to demonstrate decreased fall risk during functional activities.    Baseline 07/28/19: 32/56     Time 12    Period Weeks    Status New    Target Date  10/20/19                 Plan - 09/20/19 1426    Clinical Impression Statement Patient continues to be challenged with maintaining upright posture with prolonged ambulation as well as elevated HR. As he fatigues occasional knee hyperextension of R knee is noted resulting in decreased stance phase. Patient will continue to benefit from skilled therapy in order to improve dynamic standing balance and increase endurance    Rehab Potential Fair    Clinical Impairments Affecting Rehab Potential hx of HTN, HLD, CVA, learning disability, Diabetes, brain tumor,     PT Frequency 2x / week    PT Duration 12 weeks    PT Treatment/Interventions ADLs/Self Care Home Management;Aquatic Therapy;Ultrasound;Moist Heat;Traction;DME Instruction;Gait training;Stair training;Functional mobility training;Therapeutic activities;Therapeutic exercise;Orthotic Fit/Training;Neuromuscular re-education;Balance training;Patient/family education;Manual techniques;Wheelchair mobility training;Passive range of motion;Energy conservation;Taping;Visual/perceptual remediation/compensation    PT Next Visit Plan progress standing balance    PT Home Exercise Plan TrA contraction, glute sets: needs updates. Potentially some time in supine flat in hospital bed, to improve hip extension deficits.     Consulted and Agree with Plan of Care Patient           Patient will benefit from skilled therapeutic intervention in order to improve the  following deficits and impairments:  Abnormal gait, Decreased activity tolerance, Decreased balance, Decreased knowledge of precautions, Decreased endurance, Decreased coordination, Decreased knowledge of use of DME, Decreased mobility, Decreased range of motion, Difficulty walking, Decreased safety awareness, Decreased strength, Impaired flexibility, Impaired perceived functional ability, Impaired tone, Postural dysfunction, Improper body mechanics, Pain  Visit Diagnosis: Muscle weakness  (generalized)  Other lack of coordination  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 Acute CVA (cerebrovascular accident) ([place]) 05/02/2016  . Left-sided weakness 05/01/2016   Janna Arch, PT, DPT   09/20/2019, 2:34 PM  [place] MAIN [facility] SERVICES [address] [place], [place], [address] Phone: [phone]   Fax:  [phone]  Name: STERLIN KNIGHTLY MRN: [id number] Date of Birth: May 23, 1965

## 2019-09-20 NOTE — Therapy (Signed)
[place] MAIN [facility] SERVICES [address] [place], [place], [address] Phone: [phone]   Fax:  [phone]  Occupational Therapy Treatment  Patient Details  Name: Curtis Powell MRN: [id number] Date of Birth: [birth year] No data recorded  Encounter Date: 09/20/2019   OT End of Session - 09/20/19 1440    Visit Number [id number]    Number of Visits 209    Date for OT Re-Evaluation 10/20/19    Authorization Type Progress reporting period starting 09/15/2019    OT Start Time 1432    OT Stop Time 1515    OT Time Calculation (min) 43 min    Activity Tolerance Patient tolerated treatment well    Behavior During Therapy [facility] for tasks assessed/performed           Past Medical History:  Diagnosis Date  . GERD (gastroesophageal reflux disease)   . Hyperlipidemia   . Hypertension   . Obesity   . Stroke [facility])     Past Surgical History:  Procedure Laterality Date  . BRAIN SURGERY      There were no vitals filed for this visit.   Subjective Assessment - 09/20/19 1438    Subjective  Patient denies any pain today in the right hand.  Pt also reports he is going to the beach in August with his family for a few days for "funk fest".    Pertinent History Pt. is a 54 y.o. male who suffered a CVA on 05/01/2016. Pt. was admitted to the hospital. Once discharged, he received Home Health PT and OT services for about a month. Pt. has had multiple CVAs over the past 8 years, and has had multiple falls in the past 6 months. Pt. resides in an apartment. Pt. has caregivers for 80 hours. Pt.'s mother stays with pt. at night, and assists with IADL tasks.      Patient Stated Goals To be able to throw a ball and dribble a ball, do as much as I can for myself.     Currently in Pain? No/denies    Pain Score 0-No pain           Patient denies any pain this date Therapeutic Activities Seen for focus on reaching, scanning and manipulating cards from flat surface, use  of scatter pile over the table, picking up each suit from ascending to descending order numerically.  Occasional cues for scanning to find select cards.  Weight shifting to reach cards placed outside of base of support on right and left sides.  Patient seen for reaching tasks in sitting, occasional cues for weight shifting at times when reaching to the far left or right.  Short rest breaks as needed.  Patient working on handwriting to place words on 3x3 sticky note, formulating a list of 10 fruits, cues and assist for spelling.  Will plan to post the notes onto a bulletin board next session, ran out of time to complete activity this session.         Response to tx:  Patient continues to progress with improved range of motion, strength and coordination skills.  patient requires cues for proper form and technique during exercises for home program. Increased time allowed for scanning with scatter pile this date with putting cards in ascending order and by suit.  Active reaching with weight shifting for extended reach during task.  Patient continues to benefit from skilled occupational therapy services to maximize safety and independence in necessary daily activities at home  and in the community.               OT Education - 09/20/19 1440    Education provided Yes    Education Details HEP, [place], reaching    Person(s) Educated Patient    Methods Explanation;Demonstration    Comprehension Verbalized understanding;Returned demonstration;Verbal cues required            OT Short Term Goals - 03/03/17 1459      OT SHORT TERM GOAL #1   Title `             OT Long Term Goals - 09/15/19 1451      OT LONG TERM GOAL #4   Title Pt. will perform self dressing with minA and A/E as needed.    Baseline Pt. continues to require assist with donning underwear over feet. Min-ModA donning his jacket. (Depending on the jacket)    Time 12    Period Weeks    Status Partially Met    Target Date  10/20/19      OT LONG TERM GOAL #5   Title Pt. will perform light home making tasks with minA    Baseline Pt is able to wash dishes while seated in w/c with increased accessibility of new apartment. Pt. is able to fold his clothes, his moms puts them in the and out of the washer, and dryer, and puts them onto hangers. Caregivers perform vacuuming, and dusting.    Time 12    Period Weeks    Status Achieved      OT LONG TERM GOAL #6   Title Pt. will write his name efficiently with 100% legibility    Baseline Pt continues to present with limited with 50% legibility and efficiency when writing name to sign in when riding the [place]    Time 12    Period Weeks    Status On-going    Target Date 10/20/19      OT LONG TERM GOAL #7   Title Pt will independently and consistently follow HEP to increase UE strength to increase functional independence    Baseline Pt. continues to work towards independence with exercises    Time 12    Period Weeks    Status On-going    Target Date 10/20/19      OT LONG TERM GOAL #8   Title Pt. will require supervision ironing a shirt.    Baseline Pt. has progressed CGA, and complete set-up seated using a tabletop iron    Time 12    Period Weeks    Status On-going    Target Date 10/20/19      OT LONG TERM GOAL  #9   Baseline Pt. will independently be able to sew a button onto a shirt.    Time 12    Period Weeks    Status On-going    Target Date 10/20/19      OT LONG TERM GOAL  #10   TITLE Pt. will independently be able to throw a ball    Baseline Pt. continues to be limited with throwing overhand, however pt. is able to perform underhand throwing.    Time 12    Period Weeks    Status On-going    Target Date 10/20/19      OT LONG TERM GOAL  #11   TITLE Pt. will improve UE functional reaching to be able to independently use his RUE to hand his clothes in the closest.    Baseline  Pt. continues to be able to reach for clothing pulling them off of the  hangers. Pt. is unable to reach up to place hangers onto the the rack.    Time 12    Period Weeks    Status On-going    Target Date 10/20/19      OT LONG TERM GOAL  #13   TITLE Pt. will increase BUE strength to be able to prepare for assisting with yardwork.    Baseline Pt. reports that he has been unable to get out into the yard.    Time 12    Period Weeks    Status On-going                 Plan - 09/20/19 1441    Clinical Impression Statement Patient continues to progress with improved range of motion, strength and coordination skills.  patient requires cues for proper form and technique during exercises for home program. Increased time allowed for scanning with scatter pile this date with putting cards in ascending order and by suit.  Active reaching with weight shifting for extended reach during task.  Patient continues to benefit from skilled occupational therapy services to maximize safety and independence in necessary daily activities at home and in the community.    OT Occupational Profile and History Problem Focused Assessment - Including review of records relating to presenting problem    Occupational performance deficits (Please refer to evaluation for details): ADL's;IADL's    Body Structure / Function / Physical Skills ADL;FMC;ROM;IADL;Pain;Coordination    Rehab Potential Fair    Clinical Decision Making Several treatment options, min-mod task modification necessary    Comorbidities Affecting Occupational Performance: May have comorbidities impacting occupational performance    Modification or Assistance to Complete Evaluation  Min-Moderate modification of tasks or assist with assess necessary to complete eval    OT Frequency 1x / week    OT Duration 12 weeks    OT Treatment/Interventions Self-care/ADL training;Patient/family education;DME and/or AE instruction;Therapeutic exercise;Neuromuscular education    Consulted and Agree with Plan of Care Patient            Patient will benefit from skilled therapeutic intervention in order to improve the following deficits and impairments:   Body Structure / Function / Physical Skills: ADL, FMC, ROM, IADL, Pain, Coordination       Visit Diagnosis: Muscle weakness (generalized)  Other lack of coordination  Hemiplegia and hemiparesis following cerebral infarction affecting right dominant side [facility])    Problem List Patient Active Problem List   Diagnosis Date Noted  . Acute CVA (cerebrovascular accident) ([place]) 05/02/2016  . Left-sided weakness 05/01/2016   Braidyn Peace Oneita Jolly, OTR/L, CLT  Sidrah Harden 09/21/2019, 8:55 AM  [place] MAIN [facility] SERVICES [address] [place], [place], [address] Phone: [phone]   Fax:  [phone]  Name: Curtis Powell MRN: [id number] Date of Birth: 1966-03-09

## 2019-09-22 ENCOUNTER — Other Ambulatory Visit: Payer: Self-pay

## 2019-09-22 ENCOUNTER — Ambulatory Visit: Payer: Medicare HMO

## 2019-09-22 ENCOUNTER — Encounter: Payer: Self-pay | Admitting: Occupational Therapy

## 2019-09-22 ENCOUNTER — Ambulatory Visit: Payer: Medicare HMO | Admitting: Occupational Therapy

## 2019-09-22 DIAGNOSIS — M6281 Muscle weakness (generalized): Secondary | ICD-10-CM | POA: Diagnosis not present

## 2019-09-22 DIAGNOSIS — R2689 Other abnormalities of gait and mobility: Secondary | ICD-10-CM

## 2019-09-22 DIAGNOSIS — R278 Other lack of coordination: Secondary | ICD-10-CM

## 2019-09-22 DIAGNOSIS — I69351 Hemiplegia and hemiparesis following cerebral infarction affecting right dominant side: Secondary | ICD-10-CM

## 2019-09-22 IMAGING — CR DG PELVIS 1-2V
1 series · 1 of 1 positions shown · non-contrast
Comparison: CT pelvis 04/28/2017

CLINICAL DATA: Right groin pain, onset 5 days prior to imaging.

EXAM:
PELVIS - 1-2 VIEW

[pelvis ap]
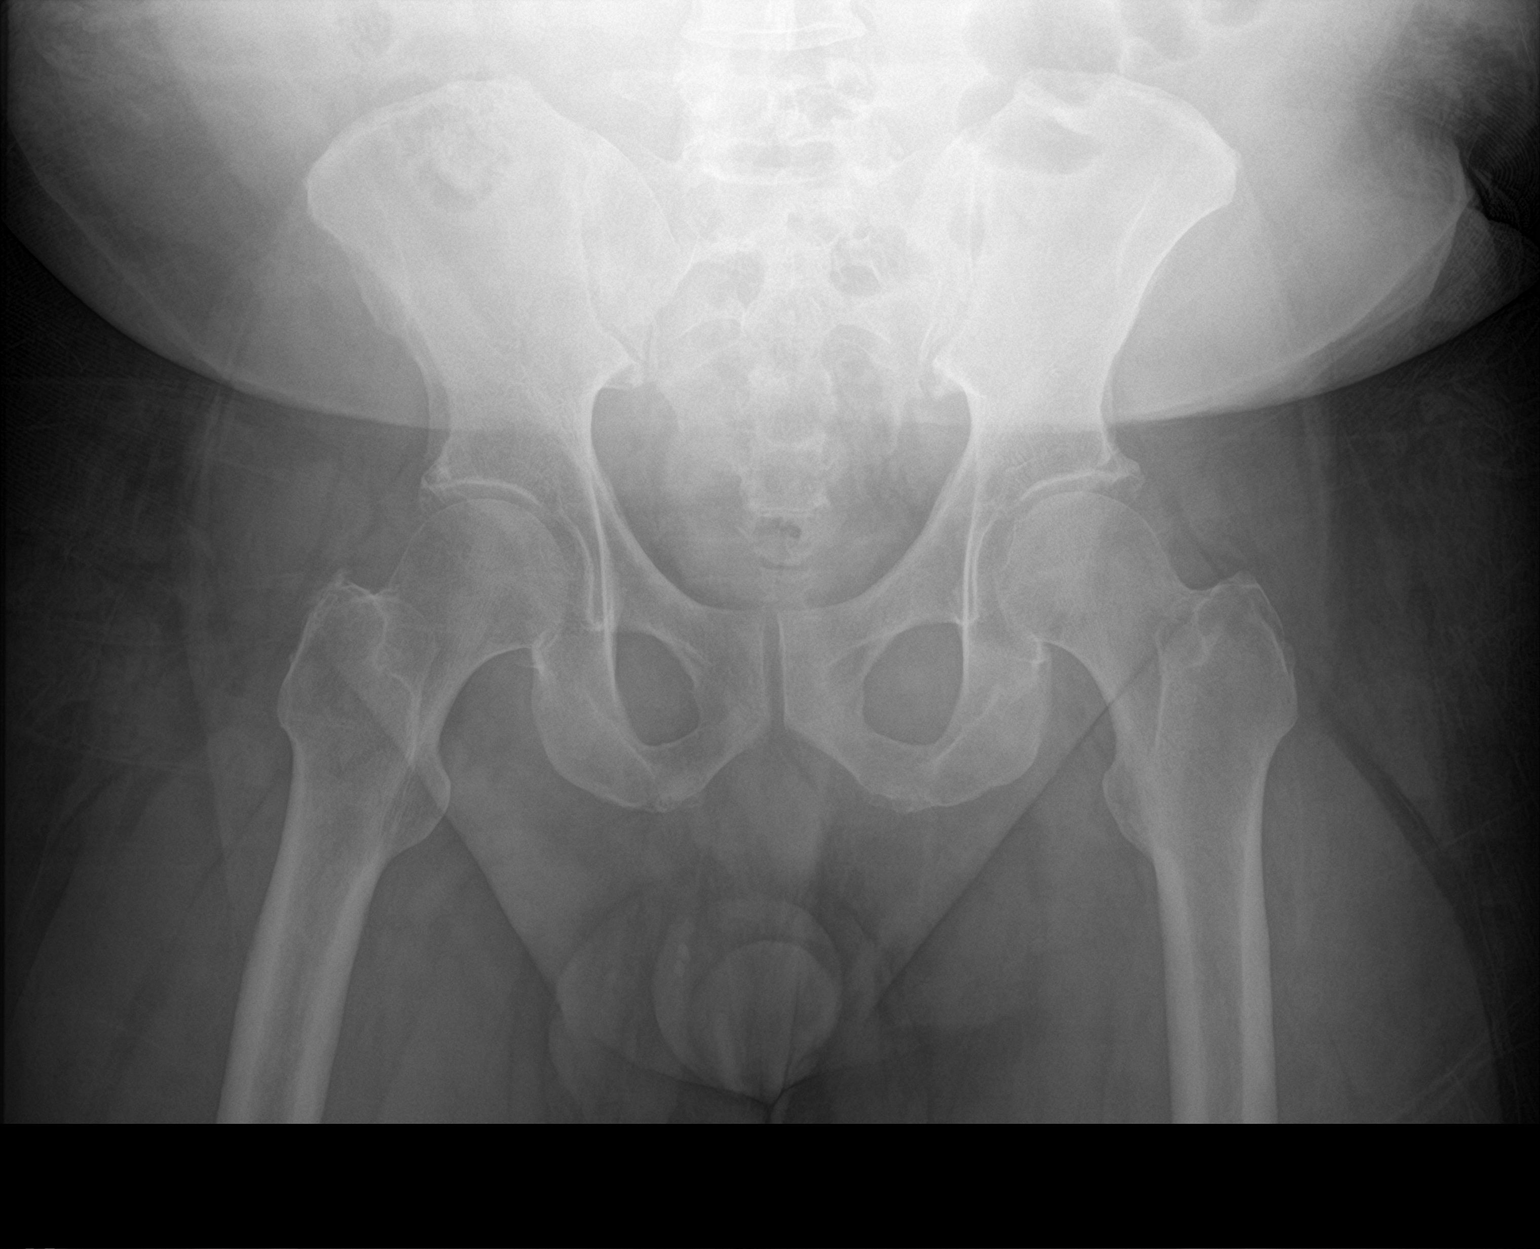

[1 of 1 positions shown; findings below may reference images not displayed]

FINDINGS: There is no evidence of pelvic fracture or diastasis. No pelvic bone
lesions are seen.
IMPRESSION: Negative.

## 2019-09-22 NOTE — Therapy (Signed)
[place] MAIN [facility] SERVICES [address] [place], [place], [address] Phone: [phone]   Fax:  [phone]  Physical Therapy Treatment  Patient Details  Name: Curtis Powell MRN: [id number] Date of Birth: 08/02/1965 No data recorded  Encounter Date: 09/22/2019   PT End of Session - 09/22/19 1354    Visit Number [id number]    Number of Visits 210    Date for PT Re-Evaluation 10/20/19    Authorization Type 9/10 PN 08/11/19    Authorization Time Period authorized 11/16/18-05/19/19    PT Start Time 1345    PT Stop Time 1429    PT Time Calculation (min) 44 min    Equipment Utilized During Treatment Gait belt    Activity Tolerance Patient tolerated treatment well;No increased pain    Behavior During Therapy WFL for tasks assessed/performed           Past Medical History:  Diagnosis Date  . GERD (gastroesophageal reflux disease)   . Hyperlipidemia   . Hypertension   . Obesity   . Stroke [facility])     Past Surgical History:  Procedure Laterality Date  . BRAIN SURGERY      There were no vitals filed for this visit.   Subjective Assessment - 09/22/19 1352    Subjective Patient reports he didn't get to go walking outside since last session but does walk inside the house sometimes. No falls or LOB since last session.    Pertinent History  Patient is a pleasant 54 year old male who presents to physical therapy for weakness and immobility secondary to CVA.  Had a stroke in Feb 2018. He was previously fully independent, but this stroke caused severe residual deficits, mainly on the right side as well as speech, and now he is unable to walk or perform most of his ADLs on his own. Entire right side is very weak. He still has a little difficulty with speech but his swallowing is improved to baseline. His mom had to move in with him and now is his main caregiver.     Limitations Sitting;Lifting;Standing;Walking;House hold activities    How long can you sit  comfortably? 5 minutes    How long can you stand comfortably? 5 minutes    How long can you walk comfortably? 10 ft    Patient Stated Goals Walk without walker, walk further with walker, get some strength in back     Currently in Pain? No/denies                  Treatment:   Nustep lvl 3-4 RPM> 70 4 minutes for cardiovascular support/challenge. ; rest break afterwards due to high HR  (121)       ambulate 200 ft x CGA with RW, cues for foot clearance, upright posture, tactile cueing to PSIS and ASIS for body mechanics and sequencing; One seated rest break for 90 seconds   Standing with RW:  -no UE support static stand 30 seconds x 2 trials with dual task of counting by 5's.   -reach for a ball on R side, dunk into hoop x15  Seated: 4" step toe taps for coordination, sequencing, response for muscle activation 30 seconds x 2 trials  GTB around ankles, alternating LAQ 10x each LE   GTB ER/IR 15x each LE, alternating LE's   Patient requires seated rest breaks between standing interventions due to fatigue, shortness of breath, and elevated HR with exercise.  Pt educated throughout session about proper posture and technique with exercises. Improved exercise technique, movement at target joints, use of target muscles after min to mod verbal, visual, tactile cues.                     PT Education - 09/22/19 1353    Education provided Yes    Education Details exercise technique, body mechanics    Person(s) Educated Patient    Methods Explanation;Demonstration;Tactile cues;Verbal cues    Comprehension Verbalized understanding;Returned demonstration;Verbal cues required;Tactile cues required            PT Short Term Goals - 04/07/19 1504      PT SHORT TERM GOAL #1   Title Patient will be independent in home exercise program to improve strength/mobility for better functional independence with ADLs.    Baseline hep compliant    Time 2    Period Weeks     Status Achieved      PT SHORT TERM GOAL #2   Title Patient will require min cueing for STS transfer with CGA for increased independence with mobility.     Baseline CGA    Time 2    Period Weeks    Status Achieved      PT SHORT TERM GOAL #3   Title Patient will maintain upright posture for > 15 seconds to demonstrate strengthened postural control muscualture     Baseline 18 seconds     Time 2    Period Weeks    Status Achieved             PT Long Term Goals - 07/28/19 0001      PT LONG TERM GOAL #1   Title Patient will perform bed mobility (rolling and supine <>sit) with Mod I  to increase mobility and  perform ADLs.     Baseline 4/21: unable to perform     Time 12    Period Weeks    Status New    Target Date 10/20/19      PT LONG TERM GOAL #2   Title Patient (< 65 years old) will complete five times sit to stand test in < 10 seconds indicating an increased LE strength and improved balance.    Baseline 4/21: 18.8 seconds 06/02/19: 19.93 seconds one hand on RW, one hand on w/c 12/30: 16 secpmds BUE support 02/11/19: 36 seconds 5/19: 20.6s from [facility], one hand on walker and one on WC, 80% upright posture 7/1: 22 seconds one hand on walker one hand on w/c  9/14: 48 seconds with full upright posture    Time 12    Period Weeks    Status Partially Met    Target Date 10/20/19      PT LONG TERM GOAL #3   Title Patient will increase Berg Balance score by > 6 points (26/56) to demonstrate decreased fall risk during functional activities.    Baseline 4/21: 32/56 06/02/19: 28/56 12/23: 25/56 9/12: 20/56 10/15: 21/56 11/7: 20/56 12/19: 22/56; 08/26/18: 11/56 7/1: 17/56  9/14: 23/56, 07/06/19=28/56    Time 8    Period Weeks    Status Achieved      PT LONG TERM GOAL #4   Title Patient will increase BLE gross strength to 4+/5 as to improve functional strength for independent gait, increased standing tolerance and increased ADL ability.    Baseline  4/21: see note 06/02/19: hip flexion 4-/5,  extension/abd 3/5 12/30: hip flexion 4/5 hip extension and abduction 3/5  02/11/19: hip flexion 4/5 hip extension and abduction 2+/54-/5 RLE, 10/15: 4/5 RLE 11/7: 4/5 RLE 3/3: 4/5 7/1:  hip flexion 4/5 hip extension 2/5 hip abduction/adduciton 2/5     Time 12    Period Weeks    Status Partially Met    Target Date 09/22/19      PT LONG TERM GOAL #5   Title Patient will increase lower extremity functional scale to >40/80 to demonstrate improved functional mobility and increased tolerance with ADLs.     Baseline 4/21: terminated due to cognition 06/02/19: 31/80 12/30: 35/80 02/10/19: 34/56 14/80; 12/26: 21/80; 2/18: 28/80  4/10; 25/80 5/29: 27/80; 7/16: 29/80 8/22: 30/80  9/12: 26/80 (feeling more tired due to moving) 10/15: 37/80 11/7: 23/80 3/3: 32/80; 08/25/18: 28/80 9/15: 32/56    Status Deferred      PT LONG TERM GOAL #6   Title Patient will ambulate 200 ft with least assistive device and Supervision with no breaks to allow for increased mobility within home.    Baseline 4/21: ambulate 150 ft     Time 12    Period Weeks    Status New    Target Date 10/20/19      PT LONG TERM GOAL #7   Title Patient will perform 10 MWT in <30 seconds for improved gait speed, gait mechanics, and functional capacity for mobility.     Baseline  07/28/19: 51 seconds with RW 06/02/19: 62.5 seconds with RW 11/4: 56 seconds with AFO and RW 12/30: 53 seconds with AFO and RW     Time 12    Period Weeks    Status Partially Met    Target Date 10/20/19      PT LONG TERM GOAL #8   Title Patient will increase Berg Balance score by > 6 points (38/56) to demonstrate decreased fall risk during functional activities.    Baseline 07/28/19: 32/56     Time 12    Period Weeks    Status New    Target Date 10/20/19                 Plan - 09/22/19 1425    Clinical Impression Statement Patient presents with excellent motivation only requiring a 90 second seated rest break with ambulation this session. Standing reaching  is improving with no episodes of LOB. Patient has less episodes of internal rotation of L foot this session. Patient will continue to benefit from skilled therapy in order to improve dynamic standing balance and increase endurance    Rehab Potential Fair    Clinical Impairments Affecting Rehab Potential hx of HTN, HLD, CVA, learning disability, Diabetes, brain tumor,     PT Frequency 2x / week    PT Duration 12 weeks    PT Treatment/Interventions ADLs/Self Care Home Management;Aquatic Therapy;Ultrasound;Moist Heat;Traction;DME Instruction;Gait training;Stair training;Functional mobility training;Therapeutic activities;Therapeutic exercise;Orthotic Fit/Training;Neuromuscular re-education;Balance training;Patient/family education;Manual techniques;Wheelchair mobility training;Passive range of motion;Energy conservation;Taping;Visual/perceptual remediation/compensation    PT Next Visit Plan progress standing balance    PT Home Exercise Plan TrA contraction, glute sets: needs updates. Potentially some time in supine flat in hospital bed, to improve hip extension deficits.     Consulted and Agree with Plan of Care Patient           Patient will benefit from skilled therapeutic intervention in order to improve the following deficits and impairments:  Abnormal gait, Decreased activity tolerance, Decreased balance, Decreased knowledge of precautions, Decreased endurance, Decreased coordination, Decreased knowledge of use of DME, Decreased mobility, Decreased  range of motion, Difficulty walking, Decreased safety awareness, Decreased strength, Impaired flexibility, Impaired perceived functional ability, Impaired tone, Postural dysfunction, Improper body mechanics, Pain  Visit Diagnosis: Muscle weakness (generalized)  Other lack of coordination  Hemiplegia and hemiparesis following cerebral infarction affecting right dominant side (HCC)  Other abnormalities of gait and mobility     Problem  List Patient Active Problem List   Diagnosis Date Noted  . Acute CVA (cerebrovascular accident) ([place]) 05/02/2016  . Left-sided weakness 05/01/2016   Janna Arch, PT, DPT   09/22/2019, 2:30 PM  [place] MAIN [facility] SERVICES [address] [place], [place], [address] Phone: [phone]   Fax:  [phone]  Name: Curtis Powell MRN: [id number] Date of Birth: 1966-02-01

## 2019-09-22 NOTE — Therapy (Signed)
[place] MAIN [facility] SERVICES [address] [place], [place], [address] Phone: [phone]   Fax:  [phone]  Occupational Therapy Treatment  Patient Details  Name: Curtis Powell MRN: [id number] Date of Birth: 10-30-1965 No data recorded  Encounter Date: 09/22/2019   OT End of Session - 09/22/19 1434    Visit Number 192    Number of Visits 209    Date for OT Re-Evaluation 10/20/19    Authorization Type Progress reporting period starting 09/15/2019    OT Start Time 1430    OT Stop Time 1514    OT Time Calculation (min) 44 min    Activity Tolerance Patient tolerated treatment well    Behavior During Therapy [facility] for tasks assessed/performed           Past Medical History:  Diagnosis Date  . GERD (gastroesophageal reflux disease)   . Hyperlipidemia   . Hypertension   . Obesity   . Stroke [facility])     Past Surgical History:  Procedure Laterality Date  . BRAIN SURGERY      There were no vitals filed for this visit.   Subjective Assessment - 09/22/19 1432    Subjective  Patient denies any pain in left hand today, wear a light compression glove this date and it feels better.    Pertinent History Pt. is a 54 y.o. male who suffered a CVA on 05/01/2016. Pt. was admitted to the hospital. Once discharged, he received Home Health PT and OT services for about a month. Pt. has had multiple CVAs over the past 8 years, and has had multiple falls in the past 6 months. Pt. resides in an apartment. Pt. has caregivers for 80 hours. Pt.'s mother stays with pt. at night, and assists with IADL tasks.      Patient Stated Goals To be able to throw a ball and dribble a ball, do as much as I can for myself.     Currently in Pain? No/denies    Pain Score 0-No pain            Patient with new compression glove this date for left hand, pt reports the glove has helped decrease pain and give hand support.    Patient seen for reaching task with use of SAEBO  tower, 4 level tower, patient able to reach from seated position to 3 levels, unable to reach to top level initially, moving looped balls each level for one set then completing top to 3rd level reach.  Occasional cues for weight shifting and grasping pattern with looped balls. Pt attempted the top level at the end and was able to place and remove the balls if holding from bottom of ball side.    Patient using 3x3 sticky notes he produced with handwriting last session and worked towards placing them on a bulletin board with use of push pins into moderate resistive board.  Performed from seated position with board flat on table.      Response to tx:  Patient able to reach top level of SAEBO tower this date and has not ever been able to achieve this level of reach previously despite attempts.  Performed well with occasional cues for grasping patterns and for weight shifting from w/c seated position.  Denies any pain in left hand with use of compression glove today. Some mild difficulty at times with use of push pins into board, with several attempts to push pins all the way in.  Patient continues  to benefit from skilled OT services to maximize safety and independence in daily tasks.                OT Education - 09/22/19 1434    Education provided Yes    Education Details HEP, [place], reaching tasks.    Person(s) Educated Patient    Methods Explanation;Demonstration    Comprehension Verbalized understanding;Returned demonstration;Verbal cues required            OT Short Term Goals - 03/03/17 1459      OT SHORT TERM GOAL #1   Title `             OT Long Term Goals - 09/15/19 1451      OT LONG TERM GOAL #4   Title Pt. will perform self dressing with minA and A/E as needed.    Baseline Pt. continues to require assist with donning underwear over feet. Min-ModA donning his jacket. (Depending on the jacket)    Time 12    Period Weeks    Status Partially Met    Target Date  10/20/19      OT LONG TERM GOAL #5   Title Pt. will perform light home making tasks with minA    Baseline Pt is able to wash dishes while seated in w/c with increased accessibility of new apartment. Pt. is able to fold his clothes, his moms puts them in the and out of the washer, and dryer, and puts them onto hangers. Caregivers perform vacuuming, and dusting.    Time 12    Period Weeks    Status Achieved      OT LONG TERM GOAL #6   Title Pt. will write his name efficiently with 100% legibility    Baseline Pt continues to present with limited with 50% legibility and efficiency when writing name to sign in when riding the [place]    Time 12    Period Weeks    Status On-going    Target Date 10/20/19      OT LONG TERM GOAL #7   Title Pt will independently and consistently follow HEP to increase UE strength to increase functional independence    Baseline Pt. continues to work towards independence with exercises    Time 12    Period Weeks    Status On-going    Target Date 10/20/19      OT LONG TERM GOAL #8   Title Pt. will require supervision ironing a shirt.    Baseline Pt. has progressed CGA, and complete set-up seated using a tabletop iron    Time 12    Period Weeks    Status On-going    Target Date 10/20/19      OT LONG TERM GOAL  #9   Baseline Pt. will independently be able to sew a button onto a shirt.    Time 12    Period Weeks    Status On-going    Target Date 10/20/19      OT LONG TERM GOAL  #10   TITLE Pt. will independently be able to throw a ball    Baseline Pt. continues to be limited with throwing overhand, however pt. is able to perform underhand throwing.    Time 12    Period Weeks    Status On-going    Target Date 10/20/19      OT LONG TERM GOAL  #11   TITLE Pt. will improve UE functional reaching to be able to independently use his  RUE to hand his clothes in the closest.    Baseline Pt. continues to be able to reach for clothing pulling them off of the  hangers. Pt. is unable to reach up to place hangers onto the the rack.    Time 12    Period Weeks    Status On-going    Target Date 10/20/19      OT LONG TERM GOAL  #13   TITLE Pt. will increase BUE strength to be able to prepare for assisting with yardwork.    Baseline Pt. reports that he has been unable to get out into the yard.    Time 12    Period Weeks    Status On-going                 Plan - 09/22/19 1435    Clinical Impression Statement Patient able to reach top level of SAEBO tower this date and has not ever been able to achieve this level of reach previously despite attempts.  Performed well with occasional cues for grasping patterns and for weight shifting from w/c seated position.  Denies any pain in left hand with use of compression glove today. Some mild difficulty at times with use of push pins into board, with several attempts to push pins all the way in.  Patient continues to benefit from skilled OT services to maximize safety and independence in daily tasks.    OT Occupational Profile and History Problem Focused Assessment - Including review of records relating to presenting problem    Occupational performance deficits (Please refer to evaluation for details): ADL's;IADL's    Body Structure / Function / Physical Skills ADL;FMC;ROM;IADL;Pain;Coordination    Rehab Potential Fair    Clinical Decision Making Several treatment options, min-mod task modification necessary    Comorbidities Affecting Occupational Performance: May have comorbidities impacting occupational performance    Modification or Assistance to Complete Evaluation  Min-Moderate modification of tasks or assist with assess necessary to complete eval    OT Frequency 1x / week    OT Duration 12 weeks    OT Treatment/Interventions Self-care/ADL training;Patient/family education;DME and/or AE instruction;Therapeutic exercise;Neuromuscular education    Consulted and Agree with Plan of Care Patient            Patient will benefit from skilled therapeutic intervention in order to improve the following deficits and impairments:   Body Structure / Function / Physical Skills: ADL, FMC, ROM, IADL, Pain, Coordination       Visit Diagnosis: Muscle weakness (generalized)  Other lack of coordination  Hemiplegia and hemiparesis following cerebral infarction affecting right dominant side [facility])    Problem List Patient Active Problem List   Diagnosis Date Noted  . Acute CVA (cerebrovascular accident) ([place]) 05/02/2016  . Left-sided weakness 05/01/2016   Keylie Beavers Oneita Jolly, OTR/L, CLT  Kaylanie Capili 09/22/2019, 9:59 PM  [place] MAIN [facility] SERVICES [address] [place], [place], [address] Phone: [phone]   Fax:  [phone]  Name: Curtis Powell MRN: [id number] Date of Birth: 12/03/1965

## 2019-09-27 ENCOUNTER — Other Ambulatory Visit: Payer: Self-pay

## 2019-09-27 ENCOUNTER — Ambulatory Visit: Payer: Medicare HMO

## 2019-09-27 ENCOUNTER — Ambulatory Visit: Payer: Medicare HMO | Admitting: Occupational Therapy

## 2019-09-27 DIAGNOSIS — I69351 Hemiplegia and hemiparesis following cerebral infarction affecting right dominant side: Secondary | ICD-10-CM

## 2019-09-27 DIAGNOSIS — R278 Other lack of coordination: Secondary | ICD-10-CM

## 2019-09-27 DIAGNOSIS — R2689 Other abnormalities of gait and mobility: Secondary | ICD-10-CM

## 2019-09-27 DIAGNOSIS — M6281 Muscle weakness (generalized): Secondary | ICD-10-CM

## 2019-09-27 NOTE — Therapy (Signed)
[place] MAIN [facility] SERVICES [address] [place], [place], [address] Phone: [phone]   Fax:  [phone]  Physical Therapy Treatment Physical Therapy Progress Note   Dates of reporting period  08/11/19   to   09/27/19  Patient Details  Name: Curtis Powell MRN: [id number] Date of Birth: 19-Jul-1965 No data recorded  Encounter Date: 09/27/2019   PT End of Session - 09/27/19 1537    Visit Number 200    Number of Visits 210    Date for PT Re-Evaluation 10/20/19    Authorization Type 10/10 PN 08/11/19; next session 1/10 PN 09/27/19    Authorization Time Period authorized 11/16/18-05/19/19    PT Start Time 1351    PT Stop Time 1429    PT Time Calculation (min) 38 min    Equipment Utilized During Treatment Gait belt    Activity Tolerance Patient tolerated treatment well;No increased pain    Behavior During Therapy WFL for tasks assessed/performed           Past Medical History:  Diagnosis Date  . GERD (gastroesophageal reflux disease)   . Hyperlipidemia   . Hypertension   . Obesity   . Stroke [facility])     Past Surgical History:  Procedure Laterality Date  . BRAIN SURGERY      There were no vitals filed for this visit.   Subjective Assessment - 09/27/19 1535    Subjective Patient is late to session. Almost fell in the bathroom earlier today due to the bathroom being slippery. Patient is upset due to close fall.    Pertinent History  Patient is a pleasant 54 year old male who presents to physical therapy for weakness and immobility secondary to CVA.  Had a stroke in Feb 2018. He was previously fully independent, but this stroke caused severe residual deficits, mainly on the right side as well as speech, and now he is unable to walk or perform most of his ADLs on his own. Entire right side is very weak. He still has a little difficulty with speech but his swallowing is improved to baseline. His mom had to move in with him and now is his main  caregiver.     Limitations Sitting;Lifting;Standing;Walking;House hold activities    How long can you sit comfortably? 5 minutes    How long can you stand comfortably? 5 minutes    How long can you walk comfortably? 10 ft    Patient Stated Goals Walk without walker, walk further with walker, get some strength in back     Currently in Pain? No/denies              [facility] PT Assessment - 09/27/19 0001      Standardized Balance Assessment   Standardized Balance Assessment Berg Balance Test      Berg Balance Test   Sit to Stand Able to stand  independently using hands    Standing Unsupported Able to stand 2 minutes with supervision    Sitting with Back Unsupported but Feet Supported on Floor or Stool Able to sit safely and securely 2 minutes    Stand to Sit Controls descent by using hands    Transfers Able to transfer safely, definite need of hands    Standing Unsupported with Eyes Closed Able to stand 10 seconds with supervision    Standing Unsupported with Feet Together Able to place feet together independently but unable to hold for 30 seconds    From Standing, Reach  Forward with Outstretched Arm Can reach forward >5 cm safely (2")    From Standing Position, Pick up Object from [place] to pick up shoe, needs supervision    From Standing Position, Turn to Look Behind Over each Shoulder Looks behind one side only/other side shows less weight shift    Turn 360 Degrees Needs assistance while turning    Standing Unsupported, Alternately Place Feet on Step/Stool Able to complete >2 steps/needs minimal assist    Standing Unsupported, One Foot in Front Able to take small step independently and hold 30 seconds    Standing on One Leg Unable to try or needs assist to prevent fall    Total Score 32           Patient is late to session. Almost fell in the bathroom earlier today due to the bathroom being slippery. Patient is upset due to close fall.    Progress note Bed mobility: Patient  reports improvement but still challenging.  5xSTS: 16.86 seconds with one hand on walker one hand on w/c  BLE strength :  200 ft ambulation: previous session 160 ft with one rest bresk  10 MWT 46.9 seconds  BERG: 32/56   treatment: Ambulate 90 ft, with RW and CGA with w/c follow, terminated at 90 ft due to HR >120.Marland Kitchen    Seated soccer ball kicks for coordination, sequencing of muscle activation, timing x 4 minutes   Patient requires seated rest breaks between standing interventions due to fatigue, shortness of breath, and elevated HR with exercise.   Pt educated throughout session about proper posture and technique with exercises. Improved exercise technique, movement at target joints, use of target muscles after min to mod verbal, visual, tactile cues.   Patient's condition has the potential to improve in response to therapy. Maximum improvement is yet to be obtained. The anticipated improvement is attainable and reasonable in a generally predictable time.  Patient reports he is able to walk better and is moving with less help at home.                  PT Education - 09/27/19 1536    Education provided Yes    Education Details exercise technique, body mechanics, goals    Person(s) Educated Patient    Methods Explanation;Demonstration;Tactile cues;Verbal cues    Comprehension Verbalized understanding;Returned demonstration;Verbal cues required;Tactile cues required            PT Short Term Goals - 09/27/19 1543      PT SHORT TERM GOAL #1   Title Patient will be independent in home exercise program to improve strength/mobility for better functional independence with ADLs.    Baseline hep compliant    Time 2    Period Weeks    Status Achieved      PT SHORT TERM GOAL #2   Title Patient will require min cueing for STS transfer with CGA for increased independence with mobility.     Baseline CGA    Time 2    Period Weeks    Status Achieved      PT SHORT TERM  GOAL #3   Title Patient will maintain upright posture for > 15 seconds to demonstrate strengthened postural control muscualture     Baseline 18 seconds     Time 2    Period Weeks    Status Achieved             PT Long Term Goals - 09/27/19 0001  PT LONG TERM GOAL #1   Title Patient will perform bed mobility (rolling and supine <>sit) with Mod I  to increase mobility and  perform ADLs.     Baseline 4/21: unable to perform 6/15: improving but still min-mod A     Time 12    Period Weeks    Status New    Target Date 10/20/19      PT LONG TERM GOAL #2   Title Patient (< 83 years old) will complete five times sit to stand test in < 10 seconds indicating an increased LE strength and improved balance.    Baseline 6/21: 16.86 seconds 4/21: 18.8 seconds 06/02/19: 19.93 seconds one hand on RW, one hand on w/c 12/30: 16 secpmds BUE support 02/11/19: 36 seconds 5/19: 20.6s from [facility], one hand on walker and one on WC, 80% upright posture 7/1: 22 seconds one hand on walker one hand on w/c  9/14: 48 seconds with full upright posture    Time 12    Period Weeks    Status Partially Met    Target Date 10/20/19      PT LONG TERM GOAL #4   Title Patient will increase BLE gross strength to 4+/5 as to improve functional strength for independent gait, increased standing tolerance and increased ADL ability.    Baseline  4/21: see note 06/02/19: hip flexion 4-/5, extension/abd 3/5 12/30: hip flexion 4/5 hip extension and abduction 3/5 02/11/19: hip flexion 4/5 hip extension and abduction 2+/54-/5 RLE, 10/15: 4/5 RLE 11/7: 4/5 RLE 3/3: 4/5 7/1:  hip flexion 4/5 hip extension 2/5 hip abduction/adduciton 2/5     Period Weeks    Status Partially Met    Target Date 10/20/19      PT LONG TERM GOAL #6   Title Patient will ambulate 200 ft with least assistive device and Supervision with no breaks to allow for increased mobility within home.    Baseline 4/21: ambulate 150 ft 6/21: had to terminate after 90 ft due  to cardiac     Time 12    Period Weeks    Status On-going    Target Date 10/20/19      PT LONG TERM GOAL #7   Title Patient will perform 10 MWT in <30 seconds for improved gait speed, gait mechanics, and functional capacity for mobility.     Baseline  6/21: 46.9 seconds with RW 07/28/19: 51 seconds with RW 06/02/19: 62.5 seconds with RW 11/4: 56 seconds with AFO and RW 12/30: 53 seconds with AFO and RW     Time 12    Period Weeks    Status Partially Met    Target Date 10/20/19      PT LONG TERM GOAL #8   Title Patient will increase Berg Balance score by > 6 points (38/56) to demonstrate decreased fall risk during functional activities.    Baseline 07/28/19: 32/56 6/21: 32/56     Time 12    Period Weeks    Status On-going    Target Date 10/20/19                 Plan - 09/27/19 1548    Clinical Impression Statement Patient is improving with his transfer speed and bed mobility. His ambulation goal was not able to be fully assessed due to HR elevation requiring termination. Patient continues to be challenged with maintaining upright posture with prolonged ambulation as well as elevated HR. As he fatigues occasional knee hyperextension of R knee is  noted resulting in decreased stance phase.  Patient's condition has the potential to improve in response to therapy. Maximum improvement is yet to be obtained. The anticipated improvement is attainable and reasonable in a generally predictable time.Patient will continue to benefit from skilled therapy in order to improve dynamic standing balance and increase endurance    Rehab Potential Fair    Clinical Impairments Affecting Rehab Potential hx of HTN, HLD, CVA, learning disability, Diabetes, brain tumor,     PT Frequency 2x / week    PT Duration 12 weeks    PT Treatment/Interventions ADLs/Self Care Home Management;Aquatic Therapy;Ultrasound;Moist Heat;Traction;DME Instruction;Gait training;Stair training;Functional mobility  training;Therapeutic activities;Therapeutic exercise;Orthotic Fit/Training;Neuromuscular re-education;Balance training;Patient/family education;Manual techniques;Wheelchair mobility training;Passive range of motion;Energy conservation;Taping;Visual/perceptual remediation/compensation    PT Next Visit Plan progress standing balance    PT Home Exercise Plan TrA contraction, glute sets: needs updates. Potentially some time in supine flat in hospital bed, to improve hip extension deficits.     Consulted and Agree with Plan of Care Patient           Patient will benefit from skilled therapeutic intervention in order to improve the following deficits and impairments:  Abnormal gait, Decreased activity tolerance, Decreased balance, Decreased knowledge of precautions, Decreased endurance, Decreased coordination, Decreased knowledge of use of DME, Decreased mobility, Decreased range of motion, Difficulty walking, Decreased safety awareness, Decreased strength, Impaired flexibility, Impaired perceived functional ability, Impaired tone, Postural dysfunction, Improper body mechanics, Pain  Visit Diagnosis: Muscle weakness (generalized)  Other lack of coordination  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 Acute CVA (cerebrovascular accident) ([place]) 05/02/2016  . Left-sided weakness 05/01/2016   Janna Arch, PT, DPT   09/27/2019, 3:49 PM  [place] MAIN [facility] SERVICES [address] [place], [place], [address] Phone: [phone]   Fax:  [phone]  Name: JAGJIT RINER MRN: [id number] Date of Birth: [birth year]

## 2019-09-29 ENCOUNTER — Encounter: Payer: Self-pay | Admitting: Occupational Therapy

## 2019-09-29 ENCOUNTER — Ambulatory Visit: Payer: Medicare HMO

## 2019-09-29 ENCOUNTER — Other Ambulatory Visit: Payer: Self-pay

## 2019-09-29 ENCOUNTER — Ambulatory Visit: Payer: Medicare HMO | Admitting: Occupational Therapy

## 2019-09-29 DIAGNOSIS — M6281 Muscle weakness (generalized): Secondary | ICD-10-CM

## 2019-09-29 DIAGNOSIS — R278 Other lack of coordination: Secondary | ICD-10-CM

## 2019-09-29 DIAGNOSIS — I69351 Hemiplegia and hemiparesis following cerebral infarction affecting right dominant side: Secondary | ICD-10-CM

## 2019-09-29 DIAGNOSIS — R2689 Other abnormalities of gait and mobility: Secondary | ICD-10-CM

## 2019-09-29 NOTE — Therapy (Signed)
[place] MAIN [facility] SERVICES [address] [place], [place], [address] Phone: [phone]   Fax:  [phone]  Physical Therapy Treatment  Patient Details  Name: Curtis Powell MRN: [id number] Date of Birth: 22-Aug-1965 No data recorded  Encounter Date: 09/29/2019   PT End of Session - 09/30/19 1626    Visit Number [id number]    Number of Visits 210    Date for PT Re-Evaluation 10/20/19    Authorization Type 1/10 PN 09/27/19    Authorization Time Period authorized 11/16/18-05/19/19    PT Start Time 1345    PT Stop Time 1429    PT Time Calculation (min) 44 min    Equipment Utilized During Treatment Gait belt    Activity Tolerance Patient tolerated treatment well;No increased pain    Behavior During Therapy WFL for tasks assessed/performed           Past Medical History:  Diagnosis Date  . GERD (gastroesophageal reflux disease)   . Hyperlipidemia   . Hypertension   . Obesity   . Stroke [facility])     Past Surgical History:  Procedure Laterality Date  . BRAIN SURGERY      There were no vitals filed for this visit.   Subjective Assessment - 09/30/19 1626    Subjective Patient reports he is feeling better today than last session. No falls or LOB    Pertinent History  Patient is a pleasant 54 year old male who presents to physical therapy for weakness and immobility secondary to CVA.  Had a stroke in Feb 2018. He was previously fully independent, but this stroke caused severe residual deficits, mainly on the right side as well as speech, and now he is unable to walk or perform most of his ADLs on his own. Entire right side is very weak. He still has a little difficulty with speech but his swallowing is improved to baseline. His mom had to move in with him and now is his main caregiver.     Limitations Sitting;Lifting;Standing;Walking;House hold activities    How long can you sit comfortably? 5 minutes    How long can you stand comfortably? 5 minutes     How long can you walk comfortably? 10 ft    Patient Stated Goals Walk without walker, walk further with walker, get some strength in back     Currently in Pain? No/denies                   ambulate 190 ft x CGA with RW, cues for foot clearance, upright posture, tactile cueing to PSIS and ASIS for body mechanics and sequencing; no rest breaks.    Standing with RW:  -no UE support static stand 30 seconds x 2 trials with dual task of counting by 5's.  -no UE support horizontal head turns 10x, vertical head turns 10x   -reach for a ball on R side, dunk into hoop x15   Seated: 4" step toe taps for coordination, sequencing, response for muscle activation 30 seconds x 2 trials   BTB adduction 15x against PT resistance BTB hamstring curl 15x against PT resistance   GTB around ankles, alternating LAQ 10x each LE   GTB ER/IR 15x each LE, alternating LE's   Patient requires seated rest breaks between standing interventions due to fatigue, shortness of breath, and elevated HR with exercise.        Pt educated throughout session about proper posture and technique with exercises. Improved exercise  technique, movement at target joints, use of target muscles after min to mod verbal, visual, tactile cues.      Patient demonstrates excellent motivation throughout session. Increased his ambulatory capacity with no seated rest breaks this session. Patient is able to maintain static standing with reach without LOB this session with decreasing reliance on UE support. Patient will continue to benefit from skilled therapy in order to improve dynamic standing balance and increase endurance               PT Education - 09/30/19 1626    Education provided Yes    Education Details exercise technique, body mechanics    Person(s) Educated Patient    Methods Explanation;Demonstration;Tactile cues;Verbal cues    Comprehension Verbalized understanding;Returned demonstration;Verbal cues  required;Tactile cues required            PT Short Term Goals - 09/27/19 1543      PT SHORT TERM GOAL #1   Title Patient will be independent in home exercise program to improve strength/mobility for better functional independence with ADLs.    Baseline hep compliant    Time 2    Period Weeks    Status Achieved      PT SHORT TERM GOAL #2   Title Patient will require min cueing for STS transfer with CGA for increased independence with mobility.     Baseline CGA    Time 2    Period Weeks    Status Achieved      PT SHORT TERM GOAL #3   Title Patient will maintain upright posture for > 15 seconds to demonstrate strengthened postural control muscualture     Baseline 18 seconds     Time 2    Period Weeks    Status Achieved             PT Long Term Goals - 09/27/19 0001      PT LONG TERM GOAL #1   Title Patient will perform bed mobility (rolling and supine <>sit) with Mod I  to increase mobility and  perform ADLs.     Baseline 4/21: unable to perform 6/15: improving but still min-mod A     Time 12    Period Weeks    Status New    Target Date 10/20/19      PT LONG TERM GOAL #2   Title Patient (< 49 years old) will complete five times sit to stand test in < 10 seconds indicating an increased LE strength and improved balance.    Baseline 6/21: 16.86 seconds 4/21: 18.8 seconds 06/02/19: 19.93 seconds one hand on RW, one hand on w/c 12/30: 16 secpmds BUE support 02/11/19: 36 seconds 5/19: 20.6s from [facility], one hand on walker and one on WC, 80% upright posture 7/1: 22 seconds one hand on walker one hand on w/c  9/14: 48 seconds with full upright posture    Time 12    Period Weeks    Status Partially Met    Target Date 10/20/19      PT LONG TERM GOAL #4   Title Patient will increase BLE gross strength to 4+/5 as to improve functional strength for independent gait, increased standing tolerance and increased ADL ability.    Baseline  4/21: see note 06/02/19: hip flexion 4-/5,  extension/abd 3/5 12/30: hip flexion 4/5 hip extension and abduction 3/5 02/11/19: hip flexion 4/5 hip extension and abduction 2+/54-/5 RLE, 10/15: 4/5 RLE 11/7: 4/5 RLE 3/3: 4/5 7/1:  hip flexion 4/5 hip  extension 2/5 hip abduction/adduciton 2/5     Period Weeks    Status Partially Met    Target Date 10/20/19      PT LONG TERM GOAL #6   Title Patient will ambulate 200 ft with least assistive device and Supervision with no breaks to allow for increased mobility within home.    Baseline 4/21: ambulate 150 ft 6/21: had to terminate after 90 ft due to cardiac     Time 12    Period Weeks    Status On-going    Target Date 10/20/19      PT LONG TERM GOAL #7   Title Patient will perform 10 MWT in <30 seconds for improved gait speed, gait mechanics, and functional capacity for mobility.     Baseline  6/21: 46.9 seconds with RW 07/28/19: 51 seconds with RW 06/02/19: 62.5 seconds with RW 11/4: 56 seconds with AFO and RW 12/30: 53 seconds with AFO and RW     Time 12    Period Weeks    Status Partially Met    Target Date 10/20/19      PT LONG TERM GOAL #8   Title Patient will increase Berg Balance score by > 6 points (38/56) to demonstrate decreased fall risk during functional activities.    Baseline 07/28/19: 32/56 6/21: 32/56     Time 12    Period Weeks    Status On-going    Target Date 10/20/19                 Plan - 09/30/19 1630    Clinical Impression Statement Patient demonstrates excellent motivation throughout session. Increased his ambulatory capacity with no seated rest breaks this session. Patient is able to maintain static standing with reach without LOB this session with decreasing reliance on UE support. Patient will continue to benefit from skilled therapy in order to improve dynamic standing balance and increase endurance    Rehab Potential Fair    Clinical Impairments Affecting Rehab Potential hx of HTN, HLD, CVA, learning disability, Diabetes, brain tumor,     PT Frequency  2x / week    PT Duration 12 weeks    PT Treatment/Interventions ADLs/Self Care Home Management;Aquatic Therapy;Ultrasound;Moist Heat;Traction;DME Instruction;Gait training;Stair training;Functional mobility training;Therapeutic activities;Therapeutic exercise;Orthotic Fit/Training;Neuromuscular re-education;Balance training;Patient/family education;Manual techniques;Wheelchair mobility training;Passive range of motion;Energy conservation;Taping;Visual/perceptual remediation/compensation    PT Next Visit Plan progress standing balance    PT Home Exercise Plan TrA contraction, glute sets: needs updates. Potentially some time in supine flat in hospital bed, to improve hip extension deficits.     Consulted and Agree with Plan of Care Patient           Patient will benefit from skilled therapeutic intervention in order to improve the following deficits and impairments:  Abnormal gait, Decreased activity tolerance, Decreased balance, Decreased knowledge of precautions, Decreased endurance, Decreased coordination, Decreased knowledge of use of DME, Decreased mobility, Decreased range of motion, Difficulty walking, Decreased safety awareness, Decreased strength, Impaired flexibility, Impaired perceived functional ability, Impaired tone, Postural dysfunction, Improper body mechanics, Pain  Visit Diagnosis: Muscle weakness (generalized)  Other lack of coordination  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 Acute CVA (cerebrovascular accident) ([place]) 05/02/2016  . Left-sided weakness 05/01/2016   Janna Arch, PT, DPT   09/30/2019, 4:33 PM  [place]  MAIN [facility] SERVICES [place], [place], [address] Phone: [phone]   Fax:  [phone]  Name: ZOHAR MARONEY MRN:  [id number] Date of Birth: 1965/08/17

## 2019-09-29 NOTE — Therapy (Signed)
[place] MAIN [facility] SERVICES [address] [place], [place], [address] Phone: [phone]   Fax:  [phone]  Occupational Therapy Treatment  Patient Details  Name: Curtis Powell MRN: [id number] Date of Birth: Aug 31, 1965 No data recorded  Encounter Date: 09/29/2019   OT End of Session - 09/29/19 1439    Visit Number [id number]    Number of Visits 209    Date for OT Re-Evaluation 10/20/19    Authorization Type Progress reporting period starting 09/15/2019    OT Start Time 1435    OT Stop Time 1515    OT Time Calculation (min) 40 min    Activity Tolerance Patient tolerated treatment well    Behavior During Therapy [facility] for tasks assessed/performed           Past Medical History:  Diagnosis Date  . GERD (gastroesophageal reflux disease)   . Hyperlipidemia   . Hypertension   . Obesity   . Stroke [facility])     Past Surgical History:  Procedure Laterality Date  . BRAIN SURGERY      There were no vitals filed for this visit.   Subjective Assessment - 09/29/19 1438    Subjective  Pt. reports feeling good.    Patient is accompanied by: Family member    Pertinent History Pt. is a 54 y.o. male who suffered a CVA on 05/01/2016. Pt. was admitted to the hospital. Once discharged, he received Home Health PT and OT services for about a month. Pt. has had multiple CVAs over the past 8 years, and has had multiple falls in the past 6 months. Pt. resides in an apartment. Pt. has caregivers for 80 hours. Pt.'s mother stays with pt. at night, and assists with IADL tasks.      Currently in Pain? No/denies          OT TREATMENT  Therapeutic Ex.  Pt. performed 2# dowel ex. for UE strengthening secondary to weakness. Bilateral shoulder flexion, chest press, circular patterns, and elbow flexion/extension were performed. 1# for shoulder flexion, chest press, and circular motion. 2# dumbbell ex. for elbow flexion and extension, forearm supination/pronation,  wrist flexion/extension, and radial deviation. Pt. requires rest breaks and verbal cues for proper technique. Pt. Worked on BUE strengthening in preparation for reaching, and lifting items during ADLS, and IADL tasks.  Response to Treatment:  Pt. reports having an orthopedic follow-up appointment on June 30th, 2021.Pt. required cues for visual demonstration, and cues for proper ther. Ex. technique and form. Pt. required the dumbbell weight to be modified from 2# to 1# for right shoulder flexion, abduction, and horizontal abduction. Pt. required rest breaks during the there. Ex. Pt. Continues to work on improving BUE strength, and [facility] skills in order to improve UE functioning during ADls, and IADL, and maximizing overall independence.                        OT Education - 09/29/19 1439    Education provided Yes    Education Details handwriting skills    Person(s) Educated Patient    Methods Explanation;Demonstration    Comprehension Verbalized understanding;Returned demonstration;Verbal cues required            OT Short Term Goals - 03/03/17 1459      OT SHORT TERM GOAL #1   Title `             OT Long Term Goals - 09/15/19 1451  OT LONG TERM GOAL #4   Title Pt. will perform self dressing with minA and A/E as needed.    Baseline Pt. continues to require assist with donning underwear over feet. Min-ModA donning his jacket. (Depending on the jacket)    Time 12    Period Weeks    Status Partially Met    Target Date 10/20/19      OT LONG TERM GOAL #5   Title Pt. will perform light home making tasks with minA    Baseline Pt is able to wash dishes while seated in w/c with increased accessibility of new apartment. Pt. is able to fold his clothes, his moms puts them in the and out of the washer, and dryer, and puts them onto hangers. Caregivers perform vacuuming, and dusting.    Time 12    Period Weeks    Status Achieved      OT LONG TERM GOAL #6    Title Pt. will write his name efficiently with 100% legibility    Baseline Pt continues to present with limited with 50% legibility and efficiency when writing name to sign in when riding the [place]    Time 12    Period Weeks    Status On-going    Target Date 10/20/19      OT LONG TERM GOAL #7   Title Pt will independently and consistently follow HEP to increase UE strength to increase functional independence    Baseline Pt. continues to work towards independence with exercises    Time 12    Period Weeks    Status On-going    Target Date 10/20/19      OT LONG TERM GOAL #8   Title Pt. will require supervision ironing a shirt.    Baseline Pt. has progressed CGA, and complete set-up seated using a tabletop iron    Time 12    Period Weeks    Status On-going    Target Date 10/20/19      OT LONG TERM GOAL  #9   Baseline Pt. will independently be able to sew a button onto a shirt.    Time 12    Period Weeks    Status On-going    Target Date 10/20/19      OT LONG TERM GOAL  #10   TITLE Pt. will independently be able to throw a ball    Baseline Pt. continues to be limited with throwing overhand, however pt. is able to perform underhand throwing.    Time 12    Period Weeks    Status On-going    Target Date 10/20/19      OT LONG TERM GOAL  #11   TITLE Pt. will improve UE functional reaching to be able to independently use his RUE to hand his clothes in the closest.    Baseline Pt. continues to be able to reach for clothing pulling them off of the hangers. Pt. is unable to reach up to place hangers onto the the rack.    Time 12    Period Weeks    Status On-going    Target Date 10/20/19      OT LONG TERM GOAL  #13   TITLE Pt. will increase BUE strength to be able to prepare for assisting with yardwork.    Baseline Pt. reports that he has been unable to get out into the yard.    Time 12    Period Weeks    Status On-going  Plan - 09/29/19 1440    Clinical  Impression Statement Pt. reports having an orthopedic follow-up appointment on June 30th, 2021.Pt. required cues for visual demonstration, and cues for proper there. Ex. technique and form. Pt. required the dumbbell weight to be modified from 2# to 1# for right shoulder flexion, abduction, and horizontal abduction. Pt. required rest breaks during the there. Ex. Pt. Continues to work on improving BUE strength, and [facility] skills in order to improve UE functioning during ADls, and IADL, and maximizing overall independence.     OT Occupational Profile and History Problem Focused Assessment - Including review of records relating to presenting problem    Occupational performance deficits (Please refer to evaluation for details): ADL's;IADL's    Body Structure / Function / Physical Skills ADL;FMC;ROM;IADL;Pain;Coordination    Rehab Potential Fair    Clinical Decision Making Several treatment options, min-mod task modification necessary    Comorbidities Affecting Occupational Performance: May have comorbidities impacting occupational performance    Modification or Assistance to Complete Evaluation  Min-Moderate modification of tasks or assist with assess necessary to complete eval    OT Frequency 1x / week    OT Duration 12 weeks    OT Treatment/Interventions Self-care/ADL training;Patient/family education;DME and/or AE instruction;Therapeutic exercise;Neuromuscular education    Consulted and Agree with Plan of Care Patient           Patient will benefit from skilled therapeutic intervention in order to improve the following deficits and impairments:   Body Structure / Function / Physical Skills: ADL, FMC, ROM, IADL, Pain, Coordination       Visit Diagnosis: Muscle weakness (generalized)  Other lack of coordination    Problem List Patient Active Problem List   Diagnosis Date Noted  . Acute CVA (cerebrovascular accident) ([place]) 05/02/2016  . Left-sided weakness 05/01/2016    Harrel Carina, MS, OTR/L 09/29/2019, 2:53 PM  [place] MAIN [facility] SERVICES [address] [place], [place], [address] Phone: [phone]   Fax:  [phone]  Name: Curtis Powell MRN: [id number] Date of Birth: Aug 11, 1965

## 2019-09-29 NOTE — Therapy (Signed)
[place] MAIN [facility] SERVICES [address] [place], [place], [address] Phone: [phone]   Fax:  [phone]  Occupational Therapy Treatment  Patient Details  Name: Curtis Powell MRN: [id number] Date of Birth: Sep 06, 1965 No data recorded  Encounter Date: 09/27/2019   OT End of Session - 09/29/19 0851    Visit Number [id number]    Number of Visits 209    Date for OT Re-Evaluation 10/20/19    Authorization Type Progress reporting period starting 09/15/2019    OT Start Time 1430    OT Stop Time 1515    OT Time Calculation (min) 45 min    Activity Tolerance Patient tolerated treatment well    Behavior During Therapy [facility] for tasks assessed/performed           Past Medical History:  Diagnosis Date  . GERD (gastroesophageal reflux disease)   . Hyperlipidemia   . Hypertension   . Obesity   . Stroke [facility])     Past Surgical History:  Procedure Laterality Date  . BRAIN SURGERY      There were no vitals filed for this visit.   Subjective Assessment - 09/29/19 0850    Subjective  Patient reports he has an appt today at [facility] and needs someone to push him over to [facility] at the end of session.    Pertinent History Pt. is a 54 y.o. male who suffered a CVA on 05/01/2016. Pt. was admitted to the hospital. Once discharged, he received Home Health PT and OT services for about a month. Pt. has had multiple CVAs over the past 8 years, and has had multiple falls in the past 6 months. Pt. resides in an apartment. Pt. has caregivers for 80 hours. Pt.'s mother stays with pt. at night, and assists with IADL tasks.      Patient Stated Goals To be able to throw a ball and dribble a ball, do as much as I can for myself.     Currently in Pain? No/denies    Pain Score 0-No pain             Patient denies any pain this date and states he would like to work on handwriting, states his mom told him she can't read his handwriting especially when signing  important papers.      7  hand exercises to help promote handwriting, dexterity and flexibility performed prior to handwriting activities.  1)  Fisting with arm then arm extension with fingers extended with large open hand movements 2) oppositional movements of the thumb to each digit 3) wrist flex/ext with elbows extended 4) supination/pronation 5) tendon gliding with hook fist and then finger extension 6) tendon gliding with tabletop movement of fingers with MP flexion, PIP and DIP extension 7) MP flexion, PIP flexion, DIP extension  rest breaks as needed during exercises, decreased toleration of exercises with prolonged shoulder flexion, arm extended.   Patient seen for focus on Handwriting skills, use of large lined paper and built up pen grip to increase surface area for grip.  Occasional cues for tripod grasping patterns on pen.  Patient writing his name, printing and script, address.  With print, patient has fair to good legibility this date, with script legibility is fair to poor.  When patient is cued to slow down and work on deliberate strokes, his performance improved.    Response to tx:   Patient continues to make good progress towards goals.  Focus this date on  handwriting skills to be able to sign and print name on important papers.  Patient benefitted from hand exercises prior to handwriting skills and was able to produce fair to good legibility today with use of print and script writing.  Pt able to recall address this date and write it without cues.  Continue to work towards goals in plan of care to improve bilateral strength, coordination and increased function for necessary daily activities.                    OT Education - 09/29/19 0850    Education provided Yes    Education Details handwriting skills    Person(s) Educated Patient    Methods Explanation;Demonstration    Comprehension Verbalized understanding;Returned demonstration;Verbal cues required             OT Short Term Goals - 03/03/17 1459      OT SHORT TERM GOAL #1   Title `             OT Long Term Goals - 09/15/19 1451      OT LONG TERM GOAL #4   Title Pt. will perform self dressing with minA and A/E as needed.    Baseline Pt. continues to require assist with donning underwear over feet. Min-ModA donning his jacket. (Depending on the jacket)    Time 12    Period Weeks    Status Partially Met    Target Date 10/20/19      OT LONG TERM GOAL #5   Title Pt. will perform light home making tasks with minA    Baseline Pt is able to wash dishes while seated in w/c with increased accessibility of new apartment. Pt. is able to fold his clothes, his moms puts them in the and out of the washer, and dryer, and puts them onto hangers. Caregivers perform vacuuming, and dusting.    Time 12    Period Weeks    Status Achieved      OT LONG TERM GOAL #6   Title Pt. will write his name efficiently with 100% legibility    Baseline Pt continues to present with limited with 50% legibility and efficiency when writing name to sign in when riding the [place]    Time 12    Period Weeks    Status On-going    Target Date 10/20/19      OT LONG TERM GOAL #7   Title Pt will independently and consistently follow HEP to increase UE strength to increase functional independence    Baseline Pt. continues to work towards independence with exercises    Time 12    Period Weeks    Status On-going    Target Date 10/20/19      OT LONG TERM GOAL #8   Title Pt. will require supervision ironing a shirt.    Baseline Pt. has progressed CGA, and complete set-up seated using a tabletop iron    Time 12    Period Weeks    Status On-going    Target Date 10/20/19      OT LONG TERM GOAL  #9   Baseline Pt. will independently be able to sew a button onto a shirt.    Time 12    Period Weeks    Status On-going    Target Date 10/20/19      OT LONG TERM GOAL  #10   TITLE Pt. will independently be able to  throw a ball    Baseline  Pt. continues to be limited with throwing overhand, however pt. is able to perform underhand throwing.    Time 12    Period Weeks    Status On-going    Target Date 10/20/19      OT LONG TERM GOAL  #11   TITLE Pt. will improve UE functional reaching to be able to independently use his RUE to hand his clothes in the closest.    Baseline Pt. continues to be able to reach for clothing pulling them off of the hangers. Pt. is unable to reach up to place hangers onto the the rack.    Time 12    Period Weeks    Status On-going    Target Date 10/20/19      OT LONG TERM GOAL  #13   TITLE Pt. will increase BUE strength to be able to prepare for assisting with yardwork.    Baseline Pt. reports that he has been unable to get out into the yard.    Time 12    Period Weeks    Status On-going                 Plan - 09/28/19 0851    Clinical Impression Statement Patient continues to make good progress towards goals.  Focus this date on handwriting skills to be able to sign and print name on important papers.  Patient benefitted from hand exercises prior to handwriting skills and was able to produce fair to good legibility today with use of print and script writing.  Pt able to recall address this date and write it without cues.  Continue to work towards goals in plan of care to improve bilateral strength, coordination and increased function for necessary daily activities.    OT Occupational Profile and History Problem Focused Assessment - Including review of records relating to presenting problem    Occupational performance deficits (Please refer to evaluation for details): ADL's;IADL's    Body Structure / Function / Physical Skills ADL;FMC;ROM;IADL;Pain;Coordination    Rehab Potential Fair    Clinical Decision Making Several treatment options, min-mod task modification necessary    Comorbidities Affecting Occupational Performance: May have comorbidities impacting  occupational performance    Modification or Assistance to Complete Evaluation  Min-Moderate modification of tasks or assist with assess necessary to complete eval    OT Frequency 1x / week    OT Duration 12 weeks    OT Treatment/Interventions Self-care/ADL training;Patient/family education;DME and/or AE instruction;Therapeutic exercise;Neuromuscular education    Consulted and Agree with Plan of Care Patient           Patient will benefit from skilled therapeutic intervention in order to improve the following deficits and impairments:   Body Structure / Function / Physical Skills: ADL, FMC, ROM, IADL, Pain, Coordination       Visit Diagnosis: Muscle weakness (generalized)  Other lack of coordination  Hemiplegia and hemiparesis following cerebral infarction affecting right dominant side [facility])    Problem List Patient Active Problem List   Diagnosis Date Noted  . Acute CVA (cerebrovascular accident) ([place]) 05/02/2016  . Left-sided weakness 05/01/2016   Achilles Dunk, OTR/L, CLT  Curtis Powell 09/29/2019, 10:11 AM  [place] MAIN [facility] SERVICES [address] [place], [place], [address] Phone: [phone]   Fax:  [phone]  Name: Curtis Powell MRN: [id number] Date of Birth: May 21, 1965

## 2019-10-04 ENCOUNTER — Other Ambulatory Visit: Payer: Self-pay

## 2019-10-04 ENCOUNTER — Ambulatory Visit: Payer: Medicare HMO

## 2019-10-04 ENCOUNTER — Ambulatory Visit: Payer: Medicare HMO | Admitting: Occupational Therapy

## 2019-10-04 ENCOUNTER — Encounter: Payer: Self-pay | Admitting: Occupational Therapy

## 2019-10-04 DIAGNOSIS — R278 Other lack of coordination: Secondary | ICD-10-CM

## 2019-10-04 DIAGNOSIS — M6281 Muscle weakness (generalized): Secondary | ICD-10-CM | POA: Diagnosis not present

## 2019-10-04 DIAGNOSIS — R2689 Other abnormalities of gait and mobility: Secondary | ICD-10-CM

## 2019-10-04 DIAGNOSIS — I69351 Hemiplegia and hemiparesis following cerebral infarction affecting right dominant side: Secondary | ICD-10-CM

## 2019-10-04 NOTE — Therapy (Signed)
[place] MAIN [facility] SERVICES [address] [place], [place], [address] Phone: [phone]   Fax:  [phone]  Occupational Therapy Treatment  Patient Details  Name: Curtis Powell MRN: [id number] Date of Birth: 1966/01/21 No data recorded  Encounter Date: 10/04/2019   OT End of Session - 10/04/19 1437    Visit Number [id number]    Number of Visits 209    Date for OT Re-Evaluation 10/20/19    Authorization Type Progress reporting period starting 09/15/2019    OT Start Time 1430    OT Stop Time 1515    OT Time Calculation (min) 45 min    Activity Tolerance Patient tolerated treatment well    Behavior During Therapy [facility] for tasks assessed/performed           Past Medical History:  Diagnosis Date  . GERD (gastroesophageal reflux disease)   . Hyperlipidemia   . Hypertension   . Obesity   . Stroke [facility])     Past Surgical History:  Procedure Laterality Date  . BRAIN SURGERY      There were no vitals filed for this visit.   Subjective Assessment - 10/04/19 1436    Subjective  Pt. reports feeling good.    Patient is accompanied by: Family member    Pertinent History Pt. is a 54 y.o. male who suffered a CVA on 05/01/2016. Pt. was admitted to the hospital. Once discharged, he received Home Health PT and OT services for about a month. Pt. has had multiple CVAs over the past 8 years, and has had multiple falls in the past 6 months. Pt. resides in an apartment. Pt. has caregivers for 80 hours. Pt.'s mother stays with pt. at night, and assists with IADL tasks.      Currently in Pain? No/denies          OT TREATMENT  Neuromuscular re-ed:  Pt. worked on reaching using the [employer]. Pt.grasped and moved the shapes through3 progressively highervertical dowels of varying heights.Pt.Required fewer cues to avoid leaningat the trunk when reaching.Pt. worked on using his right hand for grasping, and manipulating 1", 3/4", and 1/2" washers  from a magnetic dish using a 2pt. grasp pattern. Pt. worked on reaching up, stabilizing, and sustaining shoulder elevation while placing the washer over a small precise target on vertical dowels positioned at various angles.   Response to Treatment:  Pt. has a new manual w/c. Pt. has 2/10 pain in his left 3rd digit. Pt. reports that he has an orthopedic consult on June 30th. Pt.continues to make progress. Pt. required no cues today toavoid compensating proximally with hiking the right shoulder, and leaning to the left, or right when reaching.Pt.required rest breaks during the reaching tasks. Cues were needed to avoid compensating proximally with leaning to the left during [facility] tasks. Pt. Dropped less washers from his right hand when attempting to store them in his hand, and perform translatory movements. Pt. continues to work on improving RUE strength, and [facility] skills in order to work towards improving RUE functioning, functional reaching, and [facility] skillsduring ADLs, and IADL tasks.                         OT Education - 10/04/19 1437    Education provided Yes    Education Details handwriting skills    Person(s) Educated Patient    Methods Explanation;Demonstration    Comprehension Verbalized understanding;Returned demonstration;Verbal cues required    [name]) Educated  Patient            OT Short Term Goals - 03/03/17 1459      OT SHORT TERM GOAL #1   Title `             OT Long Term Goals - 09/15/19 1451      OT LONG TERM GOAL #4   Title Pt. will perform self dressing with minA and A/E as needed.    Baseline Pt. continues to require assist with donning underwear over feet. Min-ModA donning his jacket. (Depending on the jacket)    Time 12    Period Weeks    Status Partially Met    Target Date 10/20/19      OT LONG TERM GOAL #5   Title Pt. will perform light home making tasks with minA    Baseline Pt is able to wash dishes while seated in w/c with  increased accessibility of new apartment. Pt. is able to fold his clothes, his moms puts them in the and out of the washer, and dryer, and puts them onto hangers. Caregivers perform vacuuming, and dusting.    Time 12    Period Weeks    Status Achieved      OT LONG TERM GOAL #6   Title Pt. will write his name efficiently with 100% legibility    Baseline Pt continues to present with limited with 50% legibility and efficiency when writing name to sign in when riding the [place]    Time 12    Period Weeks    Status On-going    Target Date 10/20/19      OT LONG TERM GOAL #7   Title Pt will independently and consistently follow HEP to increase UE strength to increase functional independence    Baseline Pt. continues to work towards independence with exercises    Time 12    Period Weeks    Status On-going    Target Date 10/20/19      OT LONG TERM GOAL #8   Title Pt. will require supervision ironing a shirt.    Baseline Pt. has progressed CGA, and complete set-up seated using a tabletop iron    Time 12    Period Weeks    Status On-going    Target Date 10/20/19      OT LONG TERM GOAL  #9   Baseline Pt. will independently be able to sew a button onto a shirt.    Time 12    Period Weeks    Status On-going    Target Date 10/20/19      OT LONG TERM GOAL  #10   TITLE Pt. will independently be able to throw a ball    Baseline Pt. continues to be limited with throwing overhand, however pt. is able to perform underhand throwing.    Time 12    Period Weeks    Status On-going    Target Date 10/20/19      OT LONG TERM GOAL  #11   TITLE Pt. will improve UE functional reaching to be able to independently use his RUE to hand his clothes in the closest.    Baseline Pt. continues to be able to reach for clothing pulling them off of the hangers. Pt. is unable to reach up to place hangers onto the the rack.    Time 12    Period Weeks    Status On-going    Target Date 10/20/19      OT LONG  TERM  GOAL  #13   TITLE Pt. will increase BUE strength to be able to prepare for assisting with yardwork.    Baseline Pt. reports that he has been unable to get out into the yard.    Time 12    Period Weeks    Status On-going                 Plan - 10/04/19 1438    Clinical Impression Statement Pt. has a new manual w/c. Pt. has 2/10 pain in his left 3rd digit. Pt. reports that he has an orthopedic consult on June 30th. Pt.continues to make progress. Pt. required no cues today toavoid compensating proximally with hiking the right shoulder, and leaning to the left, or right when reaching.Pt.required rest breaks during the reaching tasks. Cues were needed to avoid compensating proximally with leaning to the left during [facility] tasks. Pt. Dropped less washers from his right hand when attempting to store them in his hand, and perform translatory movements. Pt. continues to work on improving RUE strength, and [facility] skills in order to work towards improving RUE functioning, functional reaching, and [facility] skillsduring ADLs, and IADL tasks.   OT Occupational Profile and History Problem Focused Assessment - Including review of records relating to presenting problem    Occupational performance deficits (Please refer to evaluation for details): ADL's;IADL's    Body Structure / Function / Physical Skills ADL;FMC;ROM;IADL;Pain;Coordination    Rehab Potential Fair    Clinical Decision Making Several treatment options, min-mod task modification necessary    Comorbidities Affecting Occupational Performance: May have comorbidities impacting occupational performance    Modification or Assistance to Complete Evaluation  Min-Moderate modification of tasks or assist with assess necessary to complete eval    OT Frequency 1x / week    OT Duration 12 weeks    OT Treatment/Interventions Self-care/ADL training;Patient/family education;DME and/or AE instruction;Therapeutic exercise;Neuromuscular education    Consulted and  Agree with Plan of Care Patient           Patient will benefit from skilled therapeutic intervention in order to improve the following deficits and impairments:   Body Structure / Function / Physical Skills: ADL, FMC, ROM, IADL, Pain, Coordination       Visit Diagnosis: Muscle weakness (generalized)  Other lack of coordination    Problem List Patient Active Problem List   Diagnosis Date Noted  . Acute CVA (cerebrovascular accident) ([place]) 05/02/2016  . Left-sided weakness 05/01/2016    Harrel Carina, MS, OTR/L 10/04/2019, 2:40 PM  [place] MAIN [facility] SERVICES [address] [place], [place], [address] Phone: [phone]   Fax:  [phone]  Name: Curtis Powell MRN: [id number] Date of Birth: 03/08/1966

## 2019-10-04 NOTE — Therapy (Signed)
[place] MAIN [facility] SERVICES [address] [place], [place], [address] Phone: [phone]   Fax:  [phone]  Physical Therapy Treatment  Patient Details  Name: Curtis Powell MRN: [id number] Date of Birth: 1966-04-07 No data recorded  Encounter Date: 10/04/2019   PT End of Session - 10/04/19 1354    Visit Number [id number]    Number of Visits 210    Date for PT Re-Evaluation 10/20/19    Authorization Type 2/10 PN 09/27/19    Authorization Time Period authorized 11/16/18-05/19/19    PT Start Time 1345    PT Stop Time 1429    PT Time Calculation (min) 44 min    Equipment Utilized During Treatment Gait belt    Activity Tolerance Patient tolerated treatment well;No increased pain    Behavior During Therapy WFL for tasks assessed/performed           Past Medical History:  Diagnosis Date  . GERD (gastroesophageal reflux disease)   . Hyperlipidemia   . Hypertension   . Obesity   . Stroke [facility])     Past Surgical History:  Procedure Laterality Date  . BRAIN SURGERY      There were no vitals filed for this visit.   Subjective Assessment - 10/04/19 1353    Subjective Patient reports compliance with HEP, has a new wheelchair he got on saturday, feels like he is able to move much better now in his new one.    Pertinent History  Patient is a pleasant 54 year old male who presents to physical therapy for weakness and immobility secondary to CVA.  Had a stroke in Feb 2018. He was previously fully independent, but this stroke caused severe residual deficits, mainly on the right side as well as speech, and now he is unable to walk or perform most of his ADLs on his own. Entire right side is very weak. He still has a little difficulty with speech but his swallowing is improved to baseline. His mom had to move in with him and now is his main caregiver.     Limitations Sitting;Lifting;Standing;Walking;House hold activities    How long can you sit  comfortably? 5 minutes    How long can you stand comfortably? 5 minutes    How long can you walk comfortably? 10 ft    Patient Stated Goals Walk without walker, walk further with walker, get some strength in back     Currently in Pain? No/denies                Treatment: Nustep Lvl 3 RPM> 80 for cardiovascular support    ambulate 80 ft x CGA with RW, cues for foot clearance, upright posture, tactile cueing to PSIS and ASIS for body mechanics and sequencing;  poor foot clearance, highly distractible this session .    Ambulate 23 ft with negotiation of star, cues for turning and keeping feet within walker; close CGA  Standing with RW:  -no UE support static stand 30 seconds x 2 trials with dual task of counting by 5's.  -no UE support horizontal head turns 10x, vertical head turns 10x   -reach for a ball on R side, dunk into hoop x15   Standing with # 4lb ankle weight: CGA for stability   -Hip flexion with BUE upper extremity support, cueing for body mechanics, speed of muscle recruitment for optimal strengthening and stabilization 10x each LE   Seated with # 4lb ankle weights  -Seated marches with  upright posture, back away from back of chair for abdominal/trunk activation/stabilization, 10x each LE -Seated LAQ with 3 second holds, 15x each LE, cueing for muscle activation and sequencing for neutral alignment -Seated IR/ER with cueing for stabilizing knee placement with lateral foot movement for optimal muscle recruitment, 10x each LE  BTB adduction 15x against PT resistance BTB hamstring curl 15x against PT resistance    GTB around ankles, alternating LAQ 10x each LE   GTB ER/IR 15x each LE, alternating LE's   Patient requires seated rest breaks between standing interventions due to fatigue, shortness of breath, and elevated HR with exercise.        Pt educated throughout session about proper posture and technique with exercises. Improved exercise technique, movement at  target joints, use of target muscles after min to mod verbal, visual, tactile cues                    PT Education - 10/04/19 1353    Education provided Yes    Education Details exercise technique, body mechanics    Person(s) Educated Patient    Methods Explanation;Demonstration;Tactile cues;Verbal cues    Comprehension Verbalized understanding;Returned demonstration;Verbal cues required;Tactile cues required            PT Short Term Goals - 09/27/19 1543      PT SHORT TERM GOAL #1   Title Patient will be independent in home exercise program to improve strength/mobility for better functional independence with ADLs.    Baseline hep compliant    Time 2    Period Weeks    Status Achieved      PT SHORT TERM GOAL #2   Title Patient will require min cueing for STS transfer with CGA for increased independence with mobility.     Baseline CGA    Time 2    Period Weeks    Status Achieved      PT SHORT TERM GOAL #3   Title Patient will maintain upright posture for > 15 seconds to demonstrate strengthened postural control muscualture     Baseline 18 seconds     Time 2    Period Weeks    Status Achieved             PT Long Term Goals - 09/27/19 0001      PT LONG TERM GOAL #1   Title Patient will perform bed mobility (rolling and supine <>sit) with Mod I  to increase mobility and  perform ADLs.     Baseline 4/21: unable to perform 6/15: improving but still min-mod A     Time 12    Period Weeks    Status New    Target Date 10/20/19      PT LONG TERM GOAL #2   Title Patient (< 57 years old) will complete five times sit to stand test in < 10 seconds indicating an increased LE strength and improved balance.    Baseline 6/21: 16.86 seconds 4/21: 18.8 seconds 06/02/19: 19.93 seconds one hand on RW, one hand on w/c 12/30: 16 secpmds BUE support 02/11/19: 36 seconds 5/19: 20.6s from [place], one hand on walker and one on WC, 80% upright posture 7/1: 22 seconds one hand on  walker one hand on w/c  9/14: 48 seconds with full upright posture    Time 12    Period Weeks    Status Partially Met    Target Date 10/20/19      PT LONG TERM GOAL #4  Title Patient will increase BLE gross strength to 4+/5 as to improve functional strength for independent gait, increased standing tolerance and increased ADL ability.    Baseline  4/21: see note 06/02/19: hip flexion 4-/5, extension/abd 3/5 12/30: hip flexion 4/5 hip extension and abduction 3/5 02/11/19: hip flexion 4/5 hip extension and abduction 2+/54-/5 RLE, 10/15: 4/5 RLE 11/7: 4/5 RLE 3/3: 4/5 7/1:  hip flexion 4/5 hip extension 2/5 hip abduction/adduciton 2/5     Period Weeks    Status Partially Met    Target Date 10/20/19      PT LONG TERM GOAL #6   Title Patient will ambulate 200 ft with least assistive device and Supervision with no breaks to allow for increased mobility within home.    Baseline 4/21: ambulate 150 ft 6/21: had to terminate after 90 ft due to cardiac     Time 12    Period Weeks    Status On-going    Target Date 10/20/19      PT LONG TERM GOAL #7   Title Patient will perform 10 MWT in <30 seconds for improved gait speed, gait mechanics, and functional capacity for mobility.     Baseline  6/21: 46.9 seconds with RW 07/28/19: 51 seconds with RW 06/02/19: 62.5 seconds with RW 11/4: 56 seconds with AFO and RW 12/30: 53 seconds with AFO and RW     Time 12    Period Weeks    Status Partially Met    Target Date 10/20/19      PT LONG TERM GOAL #8   Title Patient will increase Berg Balance score by > 6 points (38/56) to demonstrate decreased fall risk during functional activities.    Baseline 07/28/19: 32/56 6/21: 32/56     Time 12    Period Weeks    Status On-going    Target Date 10/20/19                 Plan - 10/04/19 1438    Clinical Impression Statement Patient is more challenged with ambulation this session due to distraction. Requires additional cues for upright posture however is able  to progress with decreased LOB with single task interventions. Patient will continue to benefit from skilled therapy in order to improve dynamic standing balance and increase endurance    Rehab Potential Fair    Clinical Impairments Affecting Rehab Potential hx of HTN, HLD, CVA, learning disability, Diabetes, brain tumor,     PT Frequency 2x / week    PT Duration 12 weeks    PT Treatment/Interventions ADLs/Self Care Home Management;Aquatic Therapy;Ultrasound;Moist Heat;Traction;DME Instruction;Gait training;Stair training;Functional mobility training;Therapeutic activities;Therapeutic exercise;Orthotic Fit/Training;Neuromuscular re-education;Balance training;Patient/family education;Manual techniques;Wheelchair mobility training;Passive range of motion;Energy conservation;Taping;Visual/perceptual remediation/compensation    PT Next Visit Plan progress standing balance    PT Home Exercise Plan TrA contraction, glute sets: needs updates. Potentially some time in supine flat in hospital bed, to improve hip extension deficits.     Consulted and Agree with Plan of Care Patient           Patient will benefit from skilled therapeutic intervention in order to improve the following deficits and impairments:  Abnormal gait, Decreased activity tolerance, Decreased balance, Decreased knowledge of precautions, Decreased endurance, Decreased coordination, Decreased knowledge of use of DME, Decreased mobility, Decreased range of motion, Difficulty walking, Decreased safety awareness, Decreased strength, Impaired flexibility, Impaired perceived functional ability, Impaired tone, Postural dysfunction, Improper body mechanics, Pain  Visit Diagnosis: Muscle weakness (generalized)  Other lack of coordination  Hemiplegia and hemiparesis following cerebral infarction affecting right dominant side (HCC)  Other abnormalities of gait and mobility     Problem List Patient Active Problem List   Diagnosis Date  Noted  . Acute CVA (cerebrovascular accident) ([place]) 05/02/2016  . Left-sided weakness 05/01/2016   Janna Arch, PT, DPT   10/04/2019, 2:39 PM  [place] MAIN [facility] SERVICES [address] [place], [place], [address] Phone: [phone]   Fax:  [phone]  Name: TERIK HAUGHEY MRN: [id number] Date of Birth: 25-Feb-1966

## 2019-10-06 ENCOUNTER — Ambulatory Visit: Payer: Medicare HMO | Admitting: Occupational Therapy

## 2019-10-06 ENCOUNTER — Ambulatory Visit: Payer: Medicare HMO

## 2019-10-13 ENCOUNTER — Ambulatory Visit: Payer: Medicare HMO | Attending: Family Medicine | Admitting: Occupational Therapy

## 2019-10-13 ENCOUNTER — Ambulatory Visit: Payer: Medicare HMO

## 2019-10-13 ENCOUNTER — Encounter: Payer: Self-pay | Admitting: Occupational Therapy

## 2019-10-13 ENCOUNTER — Other Ambulatory Visit: Payer: Self-pay

## 2019-10-13 DIAGNOSIS — R278 Other lack of coordination: Secondary | ICD-10-CM | POA: Diagnosis present

## 2019-10-13 DIAGNOSIS — M6281 Muscle weakness (generalized): Secondary | ICD-10-CM

## 2019-10-13 DIAGNOSIS — I69351 Hemiplegia and hemiparesis following cerebral infarction affecting right dominant side: Secondary | ICD-10-CM

## 2019-10-13 DIAGNOSIS — R2689 Other abnormalities of gait and mobility: Secondary | ICD-10-CM

## 2019-10-13 NOTE — Therapy (Signed)
[place] MAIN [facility] SERVICES [address] [place], [place], [address] Phone: [phone]   Fax:  [phone]  Occupational Therapy Treatment  Patient Details  Name: Curtis Powell MRN: [id number] Date of Birth: 1966/01/26 No data recorded  Encounter Date: 10/13/2019   OT End of Session - 10/13/19 1310    Visit Number [id number]    Number of Visits 209    Date for OT Re-Evaluation 10/20/19    Authorization Type Progress reporting period starting 09/15/2019    OT Start Time 1304    OT Stop Time 1345    OT Time Calculation (min) 41 min    Activity Tolerance Patient tolerated treatment well    Behavior During Therapy [facility] for tasks assessed/performed           Past Medical History:  Diagnosis Date  . GERD (gastroesophageal reflux disease)   . Hyperlipidemia   . Hypertension   . Obesity   . Stroke [facility])     Past Surgical History:  Procedure Laterality Date  . BRAIN SURGERY      There were no vitals filed for this visit.   Subjective Assessment - 10/13/19 1308    Subjective  Pt. reports that he is feeling better.    Patient is accompanied by: Family member    Pertinent History Pt. is a 53 y.o. male who suffered a CVA on 05/01/2016. Pt. was admitted to the hospital. Once discharged, he received Home Health PT and OT services for about a month. Pt. has had multiple CVAs over the past 8 years, and has had multiple falls in the past 6 months. Pt. resides in an apartment. Pt. has caregivers for 80 hours. Pt.'s mother stays with pt. at night, and assists with IADL tasks.      Patient Stated Goals To be able to throw a ball and dribble a ball, do as much as I can for myself.     Currently in Pain? No/denies          OT TREATMENT  Neuromuscular re-ed:  Pt. worked on reaching using the [employer]. Pt.grasped and moved the shapes through3 progressively highervertical dowels of varying heights.Pt.Required cues to avoid leaningat the trunk  when reaching.Pt. started reaching with is RUE with a 1# cuff weight in place to move the shapes through the first 2 rungs. Pt. worked on using his right hand for grasping, and manipulating 1", 3/4", and 1/2" washers from a magnetic dish using 2pt. grasp pattern. Pt. worked on reaching up, stabilizing, and sustaining shoulder elevation while placing the washer over a small precise target on vertical dowels positioned at various angles.   Response to Treatment:  Pt. reports that he received an injection in his left hand last week. Pt.continues to make progress. Pt. required cues  toavoid compensating proximally with hiking the right shoulder, and leaning to the left, or right when reaching.Pt.required rest breaks during the reaching tasks. Pt. required cues to avoid compensating proximally with leaning to the left when reaching, and during [facility] skills.Pt. dropped multiple washers from his right hand when attempting to store them in his hand, and perform translatory movements. Pt.dropped multiple washers from his hand when attempting to store them in the palm of his hand.Pt. continues to work on improving RUE strength, and [facility] skills in order to work towards improving RUE functioning, functional reaching, and [facility] skillsduring ADLs, and IADL tasks.  OT Education - 10/13/19 1310    Education provided Yes    Person(s) Educated Patient    Methods Explanation;Demonstration    Comprehension Verbalized understanding;Returned demonstration;Verbal cues required            OT Short Term Goals - 03/03/17 1459      OT SHORT TERM GOAL #1   Title `             OT Long Term Goals - 09/15/19 1451      OT LONG TERM GOAL #4   Title Pt. will perform self dressing with minA and A/E as needed.    Baseline Pt. continues to require assist with donning underwear over feet. Min-ModA donning his jacket. (Depending on the jacket)    Time 12    Period  Weeks    Status Partially Met    Target Date 10/20/19      OT LONG TERM GOAL #5   Title Pt. will perform light home making tasks with minA    Baseline Pt is able to wash dishes while seated in w/c with increased accessibility of new apartment. Pt. is able to fold his clothes, his moms puts them in the and out of the washer, and dryer, and puts them onto hangers. Caregivers perform vacuuming, and dusting.    Time 12    Period Weeks    Status Achieved      OT LONG TERM GOAL #6   Title Pt. will write his name efficiently with 100% legibility    Baseline Pt continues to present with limited with 50% legibility and efficiency when writing name to sign in when riding the [place]    Time 12    Period Weeks    Status On-going    Target Date 10/20/19      OT LONG TERM GOAL #7   Title Pt will independently and consistently follow HEP to increase UE strength to increase functional independence    Baseline Pt. continues to work towards independence with exercises    Time 12    Period Weeks    Status On-going    Target Date 10/20/19      OT LONG TERM GOAL #8   Title Pt. will require supervision ironing a shirt.    Baseline Pt. has progressed CGA, and complete set-up seated using a tabletop iron    Time 12    Period Weeks    Status On-going    Target Date 10/20/19      OT LONG TERM GOAL  #9   Baseline Pt. will independently be able to sew a button onto a shirt.    Time 12    Period Weeks    Status On-going    Target Date 10/20/19      OT LONG TERM GOAL  #10   TITLE Pt. will independently be able to throw a ball    Baseline Pt. continues to be limited with throwing overhand, however pt. is able to perform underhand throwing.    Time 12    Period Weeks    Status On-going    Target Date 10/20/19      OT LONG TERM GOAL  #11   TITLE Pt. will improve UE functional reaching to be able to independently use his RUE to hand his clothes in the closest.    Baseline Pt. continues to be able to  reach for clothing pulling them off of the hangers. Pt. is unable to reach up to place hangers onto  the the rack.    Time 12    Period Weeks    Status On-going    Target Date 10/20/19      OT LONG TERM GOAL  #13   TITLE Pt. will increase BUE strength to be able to prepare for assisting with yardwork.    Baseline Pt. reports that he has been unable to get out into the yard.    Time 12    Period Weeks    Status On-going                 Plan - 10/13/19 1310    Clinical Impression Statement Pt. reports that he received an injection in his left hand last week. Pt.continues to make progress. Pt. required cues  toavoid compensating proximally with hiking the right shoulder, and leaning to the left, or right when reaching.Pt.required rest breaks during the reaching tasks. Pt. required cues to avoid compensating proximally with leaning to the left when reaching, and during [facility] skills.Pt. dropped multiple washers from his right hand when attempting to store them in his hand, and perform translatory movements. Pt.dropped multiple washers from his hand when attempting to store them in the palm of his hand.Pt. continues to work on improving RUE strength, and [facility] skills in order to work towards improving RUE functioning, functional reaching, and [facility] skillsduring ADLs, and IADL tasks.   OT Occupational Profile and History Problem Focused Assessment - Including review of records relating to presenting problem    Occupational performance deficits (Please refer to evaluation for details): ADL's;IADL's    Body Structure / Function / Physical Skills ADL;FMC;ROM;IADL;Pain;Coordination    Rehab Potential Fair    Clinical Decision Making Several treatment options, min-mod task modification necessary    Comorbidities Affecting Occupational Performance: May have comorbidities impacting occupational performance    Modification or Assistance to Complete Evaluation  Min-Moderate modification of tasks or  assist with assess necessary to complete eval    OT Frequency 1x / week    OT Duration 12 weeks    OT Treatment/Interventions Self-care/ADL training;Patient/family education;DME and/or AE instruction;Therapeutic exercise;Neuromuscular education    Consulted and Agree with Plan of Care Patient           Patient will benefit from skilled therapeutic intervention in order to improve the following deficits and impairments:   Body Structure / Function / Physical Skills: ADL, FMC, ROM, IADL, Pain, Coordination       Visit Diagnosis: Muscle weakness (generalized)  Other lack of coordination    Problem List Patient Active Problem List   Diagnosis Date Noted  . Acute CVA (cerebrovascular accident) ([place]) 05/02/2016  . Left-sided weakness 05/01/2016    Harrel Carina, MS, OTR/L 10/13/2019, 1:14 PM  [place] MAIN [facility] SERVICES [address] [place], [place], [address] Phone: [phone]   Fax:  [phone]  Name: Curtis Powell MRN: [id number] Date of Birth: 12/08/1965

## 2019-10-13 NOTE — Therapy (Signed)
[facility] MAIN REHAB SERVICES [address] [place], [place], [address] Phone: [phone]   Fax:  [phone]  Physical Therapy Treatment  Patient Details  Name: Curtis Powell MRN: [id number] Date of Birth: 04/05/1966 No data recorded  Encounter Date: 10/13/2019   PT End of Session - 10/13/19 1421    Visit Number [id number]    Number of Visits 210    Date for PT Re-Evaluation 10/20/19    Authorization Type 3/10 PN 09/27/19    Authorization Time Period authorized 11/16/18-05/19/19    PT Start Time 1348    PT Stop Time 1429    PT Time Calculation (min) 41 min    Equipment Utilized During Treatment Gait belt    Activity Tolerance Patient tolerated treatment well;No increased pain    Behavior During Therapy WFL for tasks assessed/performed           Past Medical History:  Diagnosis Date  . GERD (gastroesophageal reflux disease)   . Hyperlipidemia   . Hypertension   . Obesity   . Stroke (HCC)     Past Surgical History:  Procedure Laterality Date  . BRAIN SURGERY      There were no vitals filed for this visit.   Subjective Assessment - 10/13/19 1419    Subjective Patient reports having a good weekend/holiday. No falls or LOB since last session.    Pertinent History  Patient is a pleasant 54 year old male who presents to physical therapy for weakness and immobility secondary to CVA.  Had a stroke in Feb 2018. He was previously fully independent, but this stroke caused severe residual deficits, mainly on the right side as well as speech, and now he is unable to walk or perform most of his ADLs on his own. Entire right side is very weak. He still has a little difficulty with speech but his swallowing is improved to baseline. His mom had to move in with him and now is his main caregiver.     Limitations Sitting;Lifting;Standing;Walking;House hold activities    How long can you sit comfortably? 5 minutes    How long can you stand comfortably? 5  minutes    How long can you walk comfortably? 10 ft    Patient Stated Goals Walk without walker, walk further with walker, get some strength in back     Currently in Pain? No/denies                      Treatment:     ambulate 173 ft x CGA with RW, cues for foot clearance, upright posture, tactile cueing to PSIS and ASIS for body mechanics and sequencing;      Standing with RW:  -no UE support static stand 30 seconds x 2 trials with dual task of counting by 5's.  -no UE support horizontal head turns 10x, vertical head turns 10x   -reach for a ball on R side, dunk into hoop x20   Standing with # 5lb ankle weight: CGA for stability   -Hip flexion with BUE upper extremity support, cueing for body mechanics, speed of muscle recruitment for optimal strengthening and stabilization 10x each LE     Seated with # 5lb ankle weights  -Seated marches with upright posture, back away from back of chair for abdominal/trunk activation/stabilization, 10x each LE -Seated LAQ with 3 second holds, 15x each LE, cueing for muscle activation and sequencing for neutral alignment -Seated IR/ER with cueing for stabilizing   knee placement with lateral foot movement for optimal muscle recruitment, 10x each LE   Soccer ball LAQ to PT for timing/sequencing x 8 each LE        Patient requires seated rest breaks between standing interventions due to fatigue, shortness of breath, and elevated HR with exercise.        Pt educated throughout session about proper posture and technique with exercises. Improved exercise technique, movement at target joints, use of target muscles after min to mod verbal, visual, tactile cues                PT Education - 10/13/19 1421    Education provided Yes    Education Details exercise technique, upright posture    Person(s) Educated Patient    Methods Explanation;Demonstration;Verbal cues;Tactile cues    Comprehension Verbalized understanding;Returned  demonstration;Verbal cues required;Tactile cues required            PT Short Term Goals - 09/27/19 1543      PT SHORT TERM GOAL #1   Title Patient will be independent in home exercise program to improve strength/mobility for better functional independence with ADLs.    Baseline hep compliant    Time 2    Period Weeks    Status Achieved      PT SHORT TERM GOAL #2   Title Patient will require min cueing for STS transfer with CGA for increased independence with mobility.     Baseline CGA    Time 2    Period Weeks    Status Achieved      PT SHORT TERM GOAL #3   Title Patient will maintain upright posture for > 15 seconds to demonstrate strengthened postural control muscualture     Baseline 18 seconds     Time 2    Period Weeks    Status Achieved             PT Long Term Goals - 09/27/19 0001      PT LONG TERM GOAL #1   Title Patient will perform bed mobility (rolling and supine <>sit) with Mod I  to increase mobility and  perform ADLs.     Baseline 4/21: unable to perform 6/15: improving but still min-mod A     Time 12    Period Weeks    Status New    Target Date 10/20/19      PT LONG TERM GOAL #2   Title Patient (< 71 years old) will complete five times sit to stand test in < 10 seconds indicating an increased LE strength and improved balance.    Baseline 6/21: 16.86 seconds 4/21: 18.8 seconds 06/02/19: 19.93 seconds one hand on RW, one hand on w/c 12/30: 16 secpmds BUE support 02/11/19: 36 seconds 5/19: 20.6s from [facility], one hand on walker and one on WC, 80% upright posture 7/1: 22 seconds one hand on walker one hand on w/c  9/14: 48 seconds with full upright posture    Time 12    Period Weeks    Status Partially Met    Target Date 10/20/19      PT LONG TERM GOAL #4   Title Patient will increase BLE gross strength to 4+/5 as to improve functional strength for independent gait, increased standing tolerance and increased ADL ability.    Baseline  4/21: see note 06/02/19:  hip flexion 4-/5, extension/abd 3/5 12/30: hip flexion 4/5 hip extension and abduction 3/5 02/11/19: hip flexion 4/5 hip extension and abduction 2+/54-/5 RLE,  10/15: 4/5 RLE 11/7: 4/5 RLE 3/3: 4/5 7/1:  hip flexion 4/5 hip extension 2/5 hip abduction/adduciton 2/5     Period Weeks    Status Partially Met    Target Date 10/20/19      PT LONG TERM GOAL #6   Title Patient will ambulate 200 ft with least assistive device and Supervision with no breaks to allow for increased mobility within home.    Baseline 4/21: ambulate 150 ft 6/21: had to terminate after 90 ft due to cardiac     Time 12    Period Weeks    Status On-going    Target Date 10/20/19      PT LONG TERM GOAL #7   Title Patient will perform 10 MWT in <30 seconds for improved gait speed, gait mechanics, and functional capacity for mobility.     Baseline  6/21: 46.9 seconds with RW 07/28/19: 51 seconds with RW 06/02/19: 62.5 seconds with RW 11/4: 56 seconds with AFO and RW 12/30: 53 seconds with AFO and RW     Time 12    Period Weeks    Status Partially Met    Target Date 10/20/19      PT LONG TERM GOAL #8   Title Patient will increase Berg Balance score by > 6 points (38/56) to demonstrate decreased fall risk during functional activities.    Baseline 07/28/19: 32/56 6/21: 32/56     Time 12    Period Weeks    Status On-going    Target Date 10/20/19                 Plan - 10/13/19 1423    Clinical Impression Statement Patient is progressing with increased duration of ambulation without seated rest break indicating improved capacity for functional mobility. Head turns without UE support continue to be challenging at this time. Patient will continue to benefit from skilled therapy in order to improve dynamic standing balance and increase endurance    Rehab Potential Fair    Clinical Impairments Affecting Rehab Potential hx of HTN, HLD, CVA, learning disability, Diabetes, brain tumor,     PT Frequency 2x / week    PT Duration  12 weeks    PT Treatment/Interventions ADLs/Self Care Home Management;Aquatic Therapy;Ultrasound;Moist Heat;Traction;DME Instruction;Gait training;Stair training;Functional mobility training;Therapeutic activities;Therapeutic exercise;Orthotic Fit/Training;Neuromuscular re-education;Balance training;Patient/family education;Manual techniques;Wheelchair mobility training;Passive range of motion;Energy conservation;Taping;Visual/perceptual remediation/compensation    PT Next Visit Plan progress standing balance    PT Home Exercise Plan TrA contraction, glute sets: needs updates. Potentially some time in supine flat in hospital bed, to improve hip extension deficits.     Consulted and Agree with Plan of Care Patient           Patient will benefit from skilled therapeutic intervention in order to improve the following deficits and impairments:  Abnormal gait, Decreased activity tolerance, Decreased balance, Decreased knowledge of precautions, Decreased endurance, Decreased coordination, Decreased knowledge of use of DME, Decreased mobility, Decreased range of motion, Difficulty walking, Decreased safety awareness, Decreased strength, Impaired flexibility, Impaired perceived functional ability, Impaired tone, Postural dysfunction, Improper body mechanics, Pain  Visit Diagnosis: Muscle weakness (generalized)  Other lack of coordination  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 Acute CVA (cerebrovascular accident) ([place]) 05/02/2016  . Left-sided weakness 05/01/2016   Janna Arch, PT, DPT   10/13/2019, 2:30  PM  [place] MAIN [facility] SERVICES [address] [place], [place], [address] Phone: [phone]   Fax:  [phone]  Name: REHAAN VILORIA MRN: [id number] Date of Birth:  25-Feb-1966

## 2019-10-18 ENCOUNTER — Encounter: Payer: Self-pay | Admitting: Occupational Therapy

## 2019-10-18 ENCOUNTER — Ambulatory Visit: Payer: Medicare HMO | Admitting: Occupational Therapy

## 2019-10-18 ENCOUNTER — Ambulatory Visit: Payer: Medicare HMO

## 2019-10-18 ENCOUNTER — Other Ambulatory Visit: Payer: Self-pay

## 2019-10-18 DIAGNOSIS — M6281 Muscle weakness (generalized): Secondary | ICD-10-CM

## 2019-10-18 DIAGNOSIS — R2689 Other abnormalities of gait and mobility: Secondary | ICD-10-CM

## 2019-10-18 DIAGNOSIS — R278 Other lack of coordination: Secondary | ICD-10-CM

## 2019-10-18 NOTE — Therapy (Addendum)
[place] MAIN [facility] SERVICES [address] [place], [place], [address] Phone: [phone]   Fax:  [phone]  Occupational Therapy Treatment/Recertification Note  Patient Details  Name: Curtis Powell MRN: [id number] Date of Birth: [birth year] No data recorded  Encounter Date: 10/18/2019   OT End of Session - 10/18/19 1303    Visit Number [id number]    Number of Visits 209    Date for OT Re-Evaluation 01/10/20    Authorization Type Progress reporting period starting 09/15/2019    OT Start Time 1300    OT Stop Time 1345    OT Time Calculation (min) 45 min    Activity Tolerance Patient tolerated treatment well    Behavior During Therapy [facility] for tasks assessed/performed           Past Medical History:  Diagnosis Date  . GERD (gastroesophageal reflux disease)   . Hyperlipidemia   . Hypertension   . Obesity   . Stroke [facility])     Past Surgical History:  Procedure Laterality Date  . BRAIN SURGERY      There were no vitals filed for this visit.   Subjective Assessment - 10/18/19 1303    Subjective  Pt. reports that he is feeling better.    Patient is accompanied by: Family member    Pertinent History Pt. is a 54 y.o. male who suffered a CVA on 05/01/2016. Pt. was admitted to the hospital. Once discharged, he received Home Health PT and OT services for about a month. Pt. has had multiple CVAs over the past 8 years, and has had multiple falls in the past 6 months. Pt. resides in an apartment. Pt. has caregivers for 80 hours. Pt.'s mother stays with pt. at night, and assists with IADL tasks.      Currently in Pain? No/denies              [facility] OT Assessment - 10/18/19 1310      Coordination   Right 9 Hole Peg Test 59    Left 9 Hole Peg Test 44      Strength   Overall Strength Comments BUE strength 4-/5 overall      Hand Function   Right Hand Grip (lbs) 42    Right Hand Lateral Pinch 21 lbs    Right Hand 3 Point Pinch 14 lbs    Left  Hand Grip (lbs) 30    Left Hand Lateral Pinch 18 lbs    Left 3 point pinch 15 lbs          Pt. is making progress overall. Pt. Continues to engage is BUEs in more tasks at home. Pt. Is improving with BUE functioning, UE strength, and coordination skills. Pt. Reports recently having had an injection in his left hand. Pt. Continues to to have difficulty with writing legibility, and efficiency, as well as performing functional reaching into cabinetry, and closets. Pt. Continues to present with limited BUE strength, and [facility] skills needed to perform daily ADLs, and IADL tasks efficiently. Measurements were obtained, and goals were reviewed with the pt.                  OT Education - 10/18/19 1303    Education provided Yes    Education Details handwriting skills    Person(s) Educated Patient    Methods Explanation;Demonstration    Comprehension Verbalized understanding;Returned demonstration;Verbal cues required            OT Short Term Goals -  03/03/17 1459      OT SHORT TERM GOAL #1   Title `             OT Long Term Goals - 10/18/19 1324      OT LONG TERM GOAL #4   Title Pt. will perform self dressing with minA and A/E as needed.    Baseline Pt. is able to donn a T shirt independently with increased time. Min-modA donning a jacket, Mod A donning pants over his feet, and socks, minA with shoes.    Time 12    Period Weeks    Status Partially Met    Target Date 01/10/20      OT LONG TERM GOAL #6   Title Pt. will write his name efficiently with 100% legibility    Baseline Ptt presents with 75% legibility with increased time required when printing his name, and 50% legibility when cursive writing his name to sign in when riding the [place]    Time 12    Period Weeks    Status On-going    Target Date 01/10/20      OT LONG TERM GOAL #7   Title Pt will independently and consistently follow HEP to increase UE strength to increase functional independence    Baseline  Pt. continues to work towards independence with exercises    Time 12    Period Weeks    Status On-going    Target Date 01/10/20      OT LONG TERM GOAL #8   Title Pt. will require supervision ironing a shirt.    Baseline Pt. has progressed CGA, and complete set-up seated using a tabletop iron    Time 12    Period Weeks    Status On-going    Target Date 01/10/20      OT LONG TERM GOAL  #9   Baseline Pt. will independently be able to sew a button onto a shirt.    Time 12    Period Weeks    Status On-going    Target Date 01/10/20      OT LONG TERM GOAL  #10   TITLE Pt. will independently be able to throw a ball    Baseline Pt. continues to be limited with throwing overhand, however pt. is able to perform underhand throwing.    Time 12    Period Weeks    Status On-going    Target Date 01/10/20      OT LONG TERM GOAL  #11   TITLE Pt. will improve UE functional reaching to be able to independently use his RUE to hand his clothes in the closest.    Baseline Pt. is improving with reaching up with the RUE, however has difficulty reaching the hanger rack in the closet.    Time 12    Period Weeks    Status On-going    Target Date 01/10/20      OT LONG TERM GOAL  #13   TITLE Pt. will increase BUE strength to be able to prepare for assisting with yardwork.    Baseline Pt. BUE strength 4-/5. Pt. reports that he has been able to get out to front.    Time 12    Period Weeks    Status On-going                 Plan - 10/18/19 1304    Clinical Impression Statement Pt. is making progress overall. Pt. Continues to engage is BUEs in more tasks  at home. Pt. Is improving with BUE functioning, UE strength, and coordination skills. Pt. Reports recently having had an injection in his left hand. Pt. Continues to to have difficulty with writing legibility, and efficiency, as well as performing functional reaching into cabinetry, and closets. Pt. Continues to present with limited BUE strength,  and [facility] skills needed to perform daily ADLs, and IADL tasks efficiently. Measurements were obtained, and goals were reviewed with the pt.   Body Structure / Function / Physical Skills ADL;FMC;ROM;IADL;Pain;Coordination    Rehab Potential Fair    Clinical Decision Making Several treatment options, min-mod task modification necessary    Comorbidities Affecting Occupational Performance: May have comorbidities impacting occupational performance    Modification or Assistance to Complete Evaluation  Min-Moderate modification of tasks or assist with assess necessary to complete eval    OT Frequency 2x / week    OT Duration 12 weeks    OT Treatment/Interventions Self-care/ADL training;Patient/family education;DME and/or AE instruction;Therapeutic exercise;Neuromuscular education    Consulted and Agree with Plan of Care Patient           Patient will benefit from skilled therapeutic intervention in order to improve the following deficits and impairments:   Body Structure / Function / Physical Skills: ADL, FMC, ROM, IADL, Pain, Coordination       Visit Diagnosis: Muscle weakness (generalized)  Other lack of coordination    Problem List Patient Active Problem List   Diagnosis Date Noted  . Acute CVA (cerebrovascular accident) ([place]) 05/02/2016  . Left-sided weakness 05/01/2016    Harrel Carina, MS, OTR/L 10/18/2019, 5:02 PM  [place] MAIN [facility] SERVICES [address] [place], [place], [address] Phone: [phone]   Fax:  [phone]  Name: Curtis Powell MRN: [id number] Date of Birth: 1966-02-06

## 2019-10-18 NOTE — Addendum Note (Signed)
Addended by: Lucia Bitter on: 10/18/2019 05:14 PM   Modules accepted: Orders

## 2019-10-18 NOTE — Therapy (Signed)
[place] MAIN [facility] SERVICES [address] [place], [place], [address] Phone: [phone]   Fax:  [phone]  Physical Therapy Treatment  Patient Details  Name: Curtis Powell MRN: [id number] Date of Birth: 25-Jan-1966 No data recorded  Encounter Date: 10/18/2019   PT End of Session - 10/18/19 1426    Visit Number [id number]    Number of Visits 210    Date for PT Re-Evaluation 10/20/19    Authorization Type 3/10 PN 09/27/19    Authorization Time Period authorized 11/16/18-05/19/19    PT Start Time 1350    PT Stop Time 1430    PT Time Calculation (min) 40 min    Equipment Utilized During Treatment Gait belt    Activity Tolerance Patient tolerated treatment well;No increased pain    Behavior During Therapy WFL for tasks assessed/performed           Past Medical History:  Diagnosis Date  . GERD (gastroesophageal reflux disease)   . Hyperlipidemia   . Hypertension   . Obesity   . Stroke [facility])     Past Surgical History:  Procedure Laterality Date  . BRAIN SURGERY      There were no vitals filed for this visit.   Subjective Assessment - 10/18/19 1344    Subjective Patient reports no changes since last visit. He denies any pain.    Pertinent History  Patient is a pleasant 54 year old male who presents to physical therapy for weakness and immobility secondary to CVA.  Had a stroke in Feb 2018. He was previously fully independent, but this stroke caused severe residual deficits, mainly on the right side as well as speech, and now he is unable to walk or perform most of his ADLs on his own. Entire right side is very weak. He still has a little difficulty with speech but his swallowing is improved to baseline. His mom had to move in with him and now is his main caregiver.     Limitations Sitting;Lifting;Standing;Walking;House hold activities    How long can you sit comfortably? 5 minutes    How long can you stand comfortably? 5 minutes    How long  can you walk comfortably? 10 ft    Patient Stated Goals Walk without walker, walk further with walker, get some strength in back     Currently in Pain? No/denies             TREATMENT    Ambulate 120 and 90 feet x CGA with RW for a total of 210 feet, resting in between each set.  Standing with RW:  no UE support static stand 30 seconds no UE support standing feet together horizontal head turns x 30 secs, vertical head turns x 30 secs reach for a ball on R side, dunk into hoop x30    Seated with # 5lb ankle weights  Seated marches with upright posture, back away from back of chair for abdominal/trunk activation/stabilization, 15x each LE Seated LAQ with 3 second holds, 15x each LE   Patient requires seated rest breaks between standing interventions due to fatigue, shortness of breath, and elevated HR with exercise.    Pt educated throughout session about proper posture and technique with exercises. Improved exercise technique, movement at target joints, use of target muscles after min to mod verbal, visual, tactile cues   Patient demonstrated good motivation throughout session. Patient is progressing walking distance and reps of exercises showing increased strength. Dynamic stability continues to  be a challenging at this time, but is improving as shown with increased reps. Patient will continue to benefit from skilled therapy in order to increase strength and endurance.      PT Short Term Goals - 09/27/19 1543      PT SHORT TERM GOAL #1   Title Patient will be independent in home exercise program to improve strength/mobility for better functional independence with ADLs.    Baseline hep compliant    Time 2    Period Weeks    Status Achieved      PT SHORT TERM GOAL #2   Title Patient will require min cueing for STS transfer with CGA for increased independence with mobility.     Baseline CGA    Time 2    Period Weeks    Status Achieved      PT SHORT TERM GOAL #3   Title  Patient will maintain upright posture for > 15 seconds to demonstrate strengthened postural control muscualture     Baseline 18 seconds     Time 2    Period Weeks    Status Achieved             PT Long Term Goals - 09/27/19 0001      PT LONG TERM GOAL #1   Title Patient will perform bed mobility (rolling and supine <>sit) with Mod I  to increase mobility and  perform ADLs.     Baseline 4/21: unable to perform 6/15: improving but still min-mod A     Time 12    Period Weeks    Status New    Target Date 10/20/19      PT LONG TERM GOAL #2   Title Patient (< 39 years old) will complete five times sit to stand test in < 10 seconds indicating an increased LE strength and improved balance.    Baseline 6/21: 16.86 seconds 4/21: 18.8 seconds 06/02/19: 19.93 seconds one hand on RW, one hand on w/c 12/30: 16 secpmds BUE support 02/11/19: 36 seconds 5/19: 20.6s from [facility], one hand on walker and one on WC, 80% upright posture 7/1: 22 seconds one hand on walker one hand on w/c  9/14: 48 seconds with full upright posture    Time 12    Period Weeks    Status Partially Met    Target Date 10/20/19      PT LONG TERM GOAL #4   Title Patient will increase BLE gross strength to 4+/5 as to improve functional strength for independent gait, increased standing tolerance and increased ADL ability.    Baseline  4/21: see note 06/02/19: hip flexion 4-/5, extension/abd 3/5 12/30: hip flexion 4/5 hip extension and abduction 3/5 02/11/19: hip flexion 4/5 hip extension and abduction 2+/54-/5 RLE, 10/15: 4/5 RLE 11/7: 4/5 RLE 3/3: 4/5 7/1:  hip flexion 4/5 hip extension 2/5 hip abduction/adduciton 2/5     Period Weeks    Status Partially Met    Target Date 10/20/19      PT LONG TERM GOAL #6   Title Patient will ambulate 200 ft with least assistive device and Supervision with no breaks to allow for increased mobility within home.    Baseline 4/21: ambulate 150 ft 6/21: had to terminate after 90 ft due to cardiac      Time 12    Period Weeks    Status On-going    Target Date 10/20/19      PT LONG TERM GOAL #7   Title Patient  will perform 10 MWT in <30 seconds for improved gait speed, gait mechanics, and functional capacity for mobility.     Baseline  6/21: 46.9 seconds with RW 07/28/19: 51 seconds with RW 06/02/19: 62.5 seconds with RW 11/4: 56 seconds with AFO and RW 12/30: 53 seconds with AFO and RW     Time 12    Period Weeks    Status Partially Met    Target Date 10/20/19      PT LONG TERM GOAL #8   Title Patient will increase Berg Balance score by > 6 points (38/56) to demonstrate decreased fall risk during functional activities.    Baseline 07/28/19: 32/56 6/21: 32/56     Time 12    Period Weeks    Status On-going    Target Date 10/20/19                 Plan - 10/18/19 1518    Clinical Impression Statement Patient demonstrated good motivation throughout session. Patient is progressing walking distance and reps of exercises showing increased strength. Dynamic stability continues to be a challenging at this time, but is improving as shown with increased reps. Patient will continue to benefit from skilled therapy in order to increase strength and endurance.    Rehab Potential Fair    Clinical Impairments Affecting Rehab Potential hx of HTN, HLD, CVA, learning disability, Diabetes, brain tumor,     PT Frequency 2x / week    PT Duration 12 weeks    PT Treatment/Interventions ADLs/Self Care Home Management;Aquatic Therapy;Ultrasound;Moist Heat;Traction;DME Instruction;Gait training;Stair training;Functional mobility training;Therapeutic activities;Therapeutic exercise;Orthotic Fit/Training;Neuromuscular re-education;Balance training;Patient/family education;Manual techniques;Wheelchair mobility training;Passive range of motion;Energy conservation;Taping;Visual/perceptual remediation/compensation    PT Next Visit Plan progress standing balance    PT Home Exercise Plan TrA contraction, glute sets:  needs updates. Potentially some time in supine flat in hospital bed, to improve hip extension deficits.     Consulted and Agree with Plan of Care Patient           Patient will benefit from skilled therapeutic intervention in order to improve the following deficits and impairments:  Abnormal gait, Decreased activity tolerance, Decreased balance, Decreased knowledge of precautions, Decreased endurance, Decreased coordination, Decreased knowledge of use of DME, Decreased mobility, Decreased range of motion, Difficulty walking, Decreased safety awareness, Decreased strength, Impaired flexibility, Impaired perceived functional ability, Impaired tone, Postural dysfunction, Improper body mechanics, Pain  Visit Diagnosis: Muscle weakness (generalized)  Other lack of coordination  Other abnormalities of gait and mobility     Problem List Patient Active Problem List   Diagnosis Date Noted  . Acute CVA (cerebrovascular accident) ([place]) 05/02/2016  . Left-sided weakness 05/01/2016    This entire session was performed under direct supervision and direction of a licensed therapist/therapist assistant . I have personally read, edited and approve of the note as written.   Noemi Chapel, SPT Phillips Grout PT, DPT, GCS  Huprich,Jason 10/18/2019, 3:22 PM  [place] MAIN [facility] SERVICES [address] [place], [place], [address] Phone: [phone]   Fax:  [phone]  Name: Curtis Powell MRN: [id number] Date of Birth: Apr 11, 1965

## 2019-10-20 ENCOUNTER — Ambulatory Visit: Payer: Medicare HMO

## 2019-10-20 ENCOUNTER — Encounter: Payer: Self-pay | Admitting: Occupational Therapy

## 2019-10-20 ENCOUNTER — Ambulatory Visit: Payer: Medicare HMO | Admitting: Occupational Therapy

## 2019-10-20 ENCOUNTER — Other Ambulatory Visit: Payer: Self-pay

## 2019-10-20 DIAGNOSIS — R2689 Other abnormalities of gait and mobility: Secondary | ICD-10-CM

## 2019-10-20 DIAGNOSIS — R278 Other lack of coordination: Secondary | ICD-10-CM

## 2019-10-20 DIAGNOSIS — M6281 Muscle weakness (generalized): Secondary | ICD-10-CM | POA: Diagnosis not present

## 2019-10-20 DIAGNOSIS — I69351 Hemiplegia and hemiparesis following cerebral infarction affecting right dominant side: Secondary | ICD-10-CM

## 2019-10-20 NOTE — Therapy (Addendum)
[place] MAIN [facility] SERVICES [address] [place], [place], [address] Phone: [phone]   Fax:  [phone]  Occupational Therapy Treatment  Patient Details  Name: Curtis Powell MRN: [id number] Date of Birth: 08-Aug-1965 No data recorded  Encounter Date: 10/20/2019   OT End of Session - 10/20/19 1353    Visit Number [id number]    Number of Visits 209    Date for OT Re-Evaluation 01/10/20    Authorization Type Progress reporting period starting 09/15/2019    OT Start Time 1347    OT Stop Time 1430    OT Time Calculation (min) 43 min    Equipment Utilized During Treatment built up pen and adaptive scissors    Activity Tolerance Patient tolerated treatment well    Behavior During Therapy WFL for tasks assessed/performed           Past Medical History:  Diagnosis Date  . GERD (gastroesophageal reflux disease)   . Hyperlipidemia   . Hypertension   . Obesity   . Stroke [facility])     Past Surgical History:  Procedure Laterality Date  . BRAIN SURGERY      There were no vitals filed for this visit.   Subjective Assessment - 10/20/19 1352    Subjective  Pt. reports being excited about his therapy.    Patient is accompanied by: Family member    Pertinent History Pt. is a 54 y.o. male who suffered a CVA on 05/01/2016. Pt. was admitted to the hospital. Once discharged, he received Home Health PT and OT services for about a month. Pt. has had multiple CVAs over the past 8 years, and has had multiple falls in the past 6 months. Pt. resides in an apartment. Pt. has caregivers for 80 hours. Pt.'s mother stays with pt. at night, and assists with IADL tasks.      Currently in Pain? No/denies          OT TREATMENT  Therapeutic Ex.  Pt. performed 2# dowel ex. for UE strengthening secondary to weakness. Bilateral shoulder flexion, chest press, circular patterns, and elbow flexion/extension were performed. Pt. requires rest breaks and verbal cues for  proper technique. Pt. Worked on BUE strengthening in preparation for reaching, and lifting items during ADLs, and IADL tasks.  Neuromuscular re-education:  Pt. worked on grasping, and manipulating 1/2" washers from a magnetic dish using a 2pt. point grasp pattern. Pt. worked on reaching up, stabilizing, and sustaining shoulder elevation while placing the washer over a small precise target on vertical dowels positioned at various angles. Pt. Worked on translatory movements moving the washers through his hand, and stacking the washers.   Response to Treatment:  Pt. Reports being excited about his therapy. Pt. Is making excellent progress, and tolerated the BUE dowel exercises well. Pt. required frequent cues for visual demonstration, as well as cues for proper ther. ex. technique and form. Pt. required rest breaks during the there. Pt. continues to work on improving BUE strength, and [facility] skills in order to improve UE functioning during ADLs, and IADL tasks and maximizing overall independence.                         OT Education - 10/20/19 1353    Education provided Yes    Education Details handwriting skills    Person(s) Educated Patient    Methods Explanation;Demonstration    Comprehension Verbalized understanding;Returned demonstration;Verbal cues required  OT Short Term Goals - 03/03/17 1459      OT SHORT TERM GOAL #1   Title `             OT Long Term Goals - 10/18/19 1324      OT LONG TERM GOAL #4   Title Pt. will perform self dressing with minA and A/E as needed.    Baseline Pt. is able to donn a T shirt independently with increased time. Min-modA donning a jacket, Mod A donning pants over his feet, and socks, minA with shoes.    Time 12    Period Weeks    Status Partially Met    Target Date 01/10/20      OT LONG TERM GOAL #6   Title Pt. will write his name efficiently with 100% legibility    Baseline Ptt presents with 75%  legibility with increased time required when printing his name, and 50% legibility when cursive writing his name to sign in when riding the [employer]    Time 12    Period Weeks    Status On-going    Target Date 01/10/20      OT LONG TERM GOAL #7   Title Pt will independently and consistently follow HEP to increase UE strength to increase functional independence    Baseline Pt. continues to work towards independence with exercises    Time 12    Period Weeks    Status On-going    Target Date 01/10/20      OT LONG TERM GOAL #8   Title Pt. will require supervision ironing a shirt.    Baseline Pt. has progressed CGA, and complete set-up seated using a tabletop iron    Time 12    Period Weeks    Status On-going    Target Date 01/10/20      OT LONG TERM GOAL  #9   Baseline Pt. will independently be able to sew a button onto a shirt.    Time 12    Period Weeks    Status On-going    Target Date 01/10/20      OT LONG TERM GOAL  #10   TITLE Pt. will independently be able to throw a ball    Baseline Pt. continues to be limited with throwing overhand, however pt. is able to perform underhand throwing.    Time 12    Period Weeks    Status On-going    Target Date 01/10/20      OT LONG TERM GOAL  #11   TITLE Pt. will improve UE functional reaching to be able to independently use his RUE to hand his clothes in the closest.    Baseline Pt. is improving with reaching up with the RUE, however has difficulty reaching the hanger rack in the closet.    Time 12    Period Weeks    Status On-going    Target Date 01/10/20      OT LONG TERM GOAL  #13   TITLE Pt. will increase BUE strength to be able to prepare for assisting with yardwork.    Baseline Pt. BUE strength 4-/5. Pt. reports that he has been able to get out to front.    Time 12    Period Weeks    Status On-going                 Plan - 10/20/19 1354    Clinical Impression Statement Pt. Reports being excited about his therapy. Pt.  Is making excellent progress, and tolerated the BUE dowel exercises well. Pt. required frequent cues for visual demonstration, as well as cues for proper ther. ex. technique and form. Pt. required rest breaks during the there. Pt. continues to work on improving BUE strength, and [facility] skills in order to improve UE functioning during ADLs, and IADL tasks and maximizing overall independence.   OT Occupational Profile and History Problem Focused Assessment - Including review of records relating to presenting problem    Occupational performance deficits (Please refer to evaluation for details): ADL's;IADL's    Body Structure / Function / Physical Skills ADL;FMC;ROM;IADL;Pain;Coordination    Rehab Potential Fair    Clinical Decision Making Several treatment options, min-mod task modification necessary    Comorbidities Affecting Occupational Performance: May have comorbidities impacting occupational performance    Modification or Assistance to Complete Evaluation  Min-Moderate modification of tasks or assist with assess necessary to complete eval    OT Frequency 2x / week    OT Duration 12 weeks    OT Treatment/Interventions Self-care/ADL training;Patient/family education;DME and/or AE instruction;Therapeutic exercise;Neuromuscular education    Consulted and Agree with Plan of Care Patient           Patient will benefit from skilled therapeutic intervention in order to improve the following deficits and impairments:   Body Structure / Function / Physical Skills: ADL, FMC, ROM, IADL, Pain, Coordination       Visit Diagnosis: Muscle weakness (generalized)  Other lack of coordination    Problem List Patient Active Problem List   Diagnosis Date Noted  . Acute CVA (cerebrovascular accident) ([place]) 05/02/2016  . Left-sided weakness 05/01/2016    Harrel Carina, MS, OTR/L 10/20/2019, 2:13 PM  [place] MAIN [facility] SERVICES [address]  [place], [place], [address] Phone: [phone]   Fax:  [phone]  Name: Curtis Powell MRN: [id number] Date of Birth: [birth year]

## 2019-10-20 NOTE — Therapy (Signed)
[place] MAIN [facility] SERVICES [address] [place], [place], [address] Phone: [phone]   Fax:  [phone]  Physical Therapy Treatment/RECERT  Patient Details  Name: Curtis Powell MRN: [id number] Date of Birth: Oct 28, 1965 No data recorded  Encounter Date: 10/20/2019   PT End of Session - 10/20/19 1721    Visit Number [id number]    Number of Visits 229    Date for PT Re-Evaluation 01/12/20    Authorization Type 4/10 PN 09/27/19    Authorization Time Period authorized 11/16/18-05/19/19    PT Start Time 1431    PT Stop Time 1513    PT Time Calculation (min) 42 min    Equipment Utilized During Treatment Gait belt    Activity Tolerance Patient tolerated treatment well;No increased pain    Behavior During Therapy WFL for tasks assessed/performed           Past Medical History:  Diagnosis Date  . GERD (gastroesophageal reflux disease)   . Hyperlipidemia   . Hypertension   . Obesity   . Stroke [facility])     Past Surgical History:  Procedure Laterality Date  . BRAIN SURGERY      There were no vitals filed for this visit.   Subjective Assessment - 10/20/19 1720    Subjective Patient reports he is feeling good, excited for his goals today. No falls or LOB since last session.    Pertinent History  Patient is a pleasant 54 year old male who presents to physical therapy for weakness and immobility secondary to CVA.  Had a stroke in Feb 2018. He was previously fully independent, but this stroke caused severe residual deficits, mainly on the right side as well as speech, and now he is unable to walk or perform most of his ADLs on his own. Entire right side is very weak. He still has a little difficulty with speech but his swallowing is improved to baseline. His mom had to move in with him and now is his main caregiver.     Limitations Sitting;Lifting;Standing;Walking;House hold activities    How long can you sit comfortably? 5 minutes    How long can  you stand comfortably? 5 minutes    How long can you walk comfortably? 10 ft    Patient Stated Goals Walk without walker, walk further with walker, get some strength in back     Currently in Pain? No/denies              [facility] PT Assessment - 10/20/19 0001      Standardized Balance Assessment   Standardized Balance Assessment Berg Balance Test      Berg Balance Test   Sit to Stand Able to stand  independently using hands    Standing Unsupported Able to stand 2 minutes with supervision    Sitting with Back Unsupported but Feet Supported on Floor or Stool Able to sit safely and securely 2 minutes    Stand to Sit Controls descent by using hands    Transfers Able to transfer safely, definite need of hands    Standing Unsupported with Eyes Closed Able to stand 10 seconds with supervision    Standing Unsupported with Feet Together Able to place feet together independently but unable to hold for 30 seconds    From Standing, Reach Forward with Outstretched Arm Can reach forward >12 cm safely (5")    From Standing Position, Pick up Object from Floor Able to pick up shoe, needs supervision  From Standing Position, Turn to Look Behind Over each Shoulder Looks behind one side only/other side shows less weight shift    Turn 360 Degrees Needs assistance while turning    Standing Unsupported, Alternately Place Feet on Step/Stool Able to complete >2 steps/needs minimal assist    Standing Unsupported, One Foot in Front Able to take small step independently and hold 30 seconds    Standing on One Leg Unable to try or needs assist to prevent fall    Total Score 33                      RECERT Bed mobility: patient reports increased ease of performance  5x STS: 19.83 seconds second trial 14. 31 seconds.  BLE strength 200 ft: 190 ft with RW and w/c follow with one standing rest break no seated rest break required 10 MWT: 37.98 seconds w w/c follow BERG:   33/56   bathroom mobility  with wheelchair performed with mod I for toileting and min A/mod A for bathroom mobility for safety , sequencing, and body mechanics   Patient is progressing towards functional goals, increasing his 10 MWT by 9 seconds as well as increasing his speed of transfers for 5x STS. Capacity for functional ambulation is improving with patient performing longest duration to date without seated rest break. New goal of bathroom mobility with a walker rather than w/c for increasing indep created. Patient will continue to benefit from skilled physical therapy to increase independence and safety with mobility, strength, and stability.          PT Education - 10/20/19 1721    Education provided Yes    Education Details goals, POC    Person(s) Educated Patient    Methods Explanation;Demonstration;Tactile cues;Verbal cues    Comprehension Verbalized understanding;Returned demonstration;Verbal cues required;Tactile cues required            PT Short Term Goals - 10/20/19 1722      PT SHORT TERM GOAL #1   Title Patient will be independent in home exercise program to improve strength/mobility for better functional independence with ADLs.    Baseline hep compliant    Time 2    Period Weeks    Status Achieved      PT SHORT TERM GOAL #2   Title Patient will require min cueing for STS transfer with CGA for increased independence with mobility.     Baseline CGA    Time 2    Period Weeks    Status Achieved      PT SHORT TERM GOAL #3   Title Patient will maintain upright posture for > 15 seconds to demonstrate strengthened postural control muscualture     Baseline 18 seconds     Time 2    Period Weeks    Status Achieved             PT Long Term Goals - 10/20/19 0001      PT LONG TERM GOAL #1   Title Patient will perform bed mobility (rolling and supine <>sit) with Mod I  to increase mobility and  perform ADLs.     Baseline 7/14: Min A 4/21: unable to perform 6/15: improving but still min-mod  A     Time 12    Period Weeks    Status Partially Met    Target Date 01/12/20      PT LONG TERM GOAL #2   Title Patient (< 54 years old) will complete five  times sit to stand test in < 10 seconds indicating an increased LE strength and improved balance.    Baseline  7/14: 14.31 seconds 6/21: 16.86 seconds 4/21: 18.8 seconds 06/02/19: 19.93 seconds one hand on RW, one hand on w/c 12/30: 16 secpmds BUE support 02/11/19: 36 seconds 5/19: 20.6s from [place], one hand on walker and one on WC, 80% upright posture 7/1: 22 seconds one hand on walker one hand on w/c  9/14: 48 seconds with full upright posture    Time 12    Period Weeks    Status Partially Met    Target Date 01/12/20      PT LONG TERM GOAL #3   Title Patient will negotiate a bathroom with a walker and Min A for increased indep in home and external environment    Baseline 7/14: unable to do     Time 12    Period Weeks    Status New    Target Date 01/12/20      PT LONG TERM GOAL #4   Title Patient will increase BLE gross strength to 4+/5 as to improve functional strength for independent gait, increased standing tolerance and increased ADL ability.    Baseline  4/21: see note 06/02/19: hip flexion 4-/5, extension/abd 3/5 12/30: hip flexion 4/5 hip extension and abduction 3/5 02/11/19: hip flexion 4/5 hip extension and abduction 2+/54-/5 RLE, 10/15: 4/5 RLE 11/7: 4/5 RLE 3/3: 4/5 7/1:  hip flexion 4/5 hip extension 2/5 hip abduction/adduciton 2/5     Period Weeks    Status Partially Met    Target Date 01/12/20      PT LONG TERM GOAL #6   Title Patient will ambulate 200 ft with least assistive device and Supervision with no breaks to allow for increased mobility within home.    Baseline 7/14: 190 ft with RW 4/21: ambulate 150 ft 6/21: had to terminate after 90 ft due to cardiac     Time 12    Period Weeks    Status Partially Met    Target Date 01/12/20      PT LONG TERM GOAL #7   Title Patient will perform 10 MWT in <30 seconds for  improved gait speed, gait mechanics, and functional capacity for mobility.     Baseline  7/14: 37.98 seconds 6/21: 46.9 seconds with RW 07/28/19: 51 seconds with RW 06/02/19: 62.5 seconds with RW 11/4: 56 seconds with AFO and RW 12/30: 53 seconds with AFO and RW     Time 12    Period Weeks    Status Partially Met    Target Date 01/12/20      PT LONG TERM GOAL #8   Title Patient will increase Berg Balance score by > 6 points (38/56) to demonstrate decreased fall risk during functional activities.    Baseline 7/14: 33/56 07/28/19: 32/56 6/21: 32/56     Time 12    Period Weeks    Status Partially Met    Target Date 01/12/20                 Plan - 10/20/19 1728    Clinical Impression Statement Patient is progressing towards functional goals, increasing his 10 MWT by 9 seconds as well as increasing his speed of transfers for 5x STS. Capacity for functional ambulation is improving with patient performing longest duration to date without seated rest break. New goal of bathroom mobility with a walker rather than w/c for increasing indep created. Patient will continue to benefit  from skilled physical therapy to increase independence and safety with mobility, strength, and stability.    Rehab Potential Fair    Clinical Impairments Affecting Rehab Potential hx of HTN, HLD, CVA, learning disability, Diabetes, brain tumor,     PT Frequency 2x / week    PT Duration 12 weeks    PT Treatment/Interventions ADLs/Self Care Home Management;Aquatic Therapy;Ultrasound;Moist Heat;Traction;DME Instruction;Gait training;Stair training;Functional mobility training;Therapeutic activities;Therapeutic exercise;Orthotic Fit/Training;Neuromuscular re-education;Balance training;Patient/family education;Manual techniques;Wheelchair mobility training;Passive range of motion;Energy conservation;Taping;Visual/perceptual remediation/compensation    PT Next Visit Plan progress standing balance    PT Home Exercise Plan TrA  contraction, glute sets: needs updates. Potentially some time in supine flat in hospital bed, to improve hip extension deficits.     Consulted and Agree with Plan of Care Patient           Patient will benefit from skilled therapeutic intervention in order to improve the following deficits and impairments:  Abnormal gait, Decreased activity tolerance, Decreased balance, Decreased knowledge of precautions, Decreased endurance, Decreased coordination, Decreased knowledge of use of DME, Decreased mobility, Decreased range of motion, Difficulty walking, Decreased safety awareness, Decreased strength, Impaired flexibility, Impaired perceived functional ability, Impaired tone, Postural dysfunction, Improper body mechanics, Pain  Visit Diagnosis: Muscle weakness (generalized)  Other lack of coordination  Other abnormalities of gait and mobility  Hemiplegia and hemiparesis following cerebral infarction affecting right dominant side [facility])     Problem List Patient Active Problem List   Diagnosis Date Noted  . Acute CVA (cerebrovascular accident) ([place]) 05/02/2016  . Left-sided weakness 05/01/2016   Janna Arch, PT, DPT   10/20/2019, 5:30 PM  [place] MAIN [facility] SERVICES [address] [place], [place], [address] Phone: [phone]   Fax:  [phone]  Name: Curtis Powell MRN: [id number] Date of Birth: [birth year]

## 2019-10-25 ENCOUNTER — Ambulatory Visit: Payer: Medicare HMO

## 2019-10-25 ENCOUNTER — Other Ambulatory Visit: Payer: Self-pay

## 2019-10-25 ENCOUNTER — Encounter: Payer: Self-pay | Admitting: Occupational Therapy

## 2019-10-25 ENCOUNTER — Ambulatory Visit: Payer: Medicare HMO | Admitting: Occupational Therapy

## 2019-10-25 DIAGNOSIS — I69351 Hemiplegia and hemiparesis following cerebral infarction affecting right dominant side: Secondary | ICD-10-CM

## 2019-10-25 DIAGNOSIS — R278 Other lack of coordination: Secondary | ICD-10-CM

## 2019-10-25 DIAGNOSIS — M6281 Muscle weakness (generalized): Secondary | ICD-10-CM | POA: Diagnosis not present

## 2019-10-25 DIAGNOSIS — R2689 Other abnormalities of gait and mobility: Secondary | ICD-10-CM

## 2019-10-25 NOTE — Therapy (Addendum)
[facility] MAIN REHAB SERVICES [address] [place], [place], [address] Phone: [phone]   Fax:  [phone]  Occupational Therapy Treatment  Patient Details  Name: Curtis Powell MRN: [id number] Date of Birth: 09/12/1965 No data recorded  Encounter Date: 10/25/2019   OT End of Session - 10/25/19 1321    Visit Number [id number]    Number of Visits 209    Date for OT Re-Evaluation 01/10/20    OT Start Time 1310    OT Stop Time 1343    OT Time Calculation (min) 33 min    Activity Tolerance Patient tolerated treatment well    Behavior During Therapy WFL for tasks assessed/performed           Past Medical History:  Diagnosis Date  . GERD (gastroesophageal reflux disease)   . Hyperlipidemia   . Hypertension   . Obesity   . Stroke ([facility])     Past Surgical History:  Procedure Laterality Date  . BRAIN SURGERY      There were no vitals filed for this visit.   Subjective Assessment - 10/25/19 1319    Subjective  Pt. reports being excited about his therapy.    Patient is accompanied by: Family member    Pertinent History Pt. is a 54 y.o. male who suffered a CVA on 05/01/2016. Pt. was admitted to the hospital. Once discharged, he received Home Health PT and OT services for about a month. Pt. has had multiple CVAs over the past 8 years, and has had multiple falls in the past 6 months. Pt. resides in an apartment. Pt. has caregivers for 80 hours. Pt.'s mother stays with pt. at night, and assists with IADL tasks.      Currently in Pain? No/denies         OT TREATMENT  Therapeutic Ex.  Pt. performed 2.5# dowel ex.for UE strengthening secondary to weakness. Bilateral shoulder flexion, chest press, circular patterns, and elbow flexion/extension were performed for 1 to 2 sets of 20 reps. Pt. requires rest breaks and verbal cues for proper technique.Pt. worked on BUE strengthening in preparation for reaching, and lifting items during ADLs, and  IADL tasks.  Neuromuscular re-education:  Pt. worked on using his right hand for grasping, and manipulating 1/2" washers from a magnetic dish using a 2pt. point grasp pattern. Pt. worked on reaching up, and placing them onto magnetic hooks positioned on a vertical whiteboard.   Pt. worked on storing the washers in the palm of his hand, and translatory movements moving the washers through his hand to the tip of his fingers to prepare for placing them into a container.  Response to Treatment:  Pt. continues to make excellent progress, and tolerated increased weight during the BUE dowel exercises. Pt.required frequent cues for visual demonstration, as well as cues for proper ther. ex. technique and form. Pt. continues to require cues to avoid compensating proximally with hiking his right shoulder, and leaning to the left. Rest breaks were required. Pt. continues to work on improving BUE strength, and FMC skills in order to improve UE functioning during ADLs, and IADL tasks and maximizing overall independence.                        OT Education - 10/25/19 1320    Education provided Yes    Person(s) Educated Patient    Methods Explanation;Demonstration    Comprehension Verbalized understanding;Returned demonstration;Verbal cues required    Education Details   Goals, POC    Person(s) Educated Patient    Methods Explanation    Comprehension Verbalized understanding            OT Short Term Goals - 03/03/17 1459      OT SHORT TERM GOAL #1   Title `             OT Long Term Goals - 10/18/19 1324      OT LONG TERM GOAL #4   Title Pt. will perform self dressing with minA and A/E as needed.    Baseline Pt. is able to donn a T shirt independently with increased time. Min-modA donning a jacket, Mod A donning pants over his feet, and socks, minA with shoes.    Time 12    Period Weeks    Status Partially Met    Target Date 01/10/20      OT LONG TERM GOAL #6    Title Pt. will write his name efficiently with 100% legibility    Baseline Ptt presents with 75% legibility with increased time required when printing his name, and 50% legibility when cursive writing his name to sign in when riding the [place]    Time 12    Period Weeks    Status On-going    Target Date 01/10/20      OT LONG TERM GOAL #7   Title Pt will independently and consistently follow HEP to increase UE strength to increase functional independence    Baseline Pt. continues to work towards independence with exercises    Time 12    Period Weeks    Status On-going    Target Date 01/10/20      OT LONG TERM GOAL #8   Title Pt. will require supervision ironing a shirt.    Baseline Pt. has progressed CGA, and complete set-up seated using a tabletop iron    Time 12    Period Weeks    Status On-going    Target Date 01/10/20      OT LONG TERM GOAL  #9   Baseline Pt. will independently be able to sew a button onto a shirt.    Time 12    Period Weeks    Status On-going    Target Date 01/10/20      OT LONG TERM GOAL  #10   TITLE Pt. will independently be able to throw a ball    Baseline Pt. continues to be limited with throwing overhand, however pt. is able to perform underhand throwing.    Time 12    Period Weeks    Status On-going    Target Date 01/10/20      OT LONG TERM GOAL  #11   TITLE Pt. will improve UE functional reaching to be able to independently use his RUE to hand his clothes in the closest.    Baseline Pt. is improving with reaching up with the RUE, however has difficulty reaching the hanger rack in the closet.    Time 12    Period Weeks    Status On-going    Target Date 01/10/20      OT LONG TERM GOAL  #13   TITLE Pt. will increase BUE strength to be able to prepare for assisting with yardwork.    Baseline Pt. BUE strength 4-/5. Pt. reports that he has been able to get out to front.    Time 12    Period Weeks    Status On-going  Plan  - 10/25/19 1324    Clinical Impression Statement Pt. continues to make excellent progress, and tolerated increased weight during the BUE dowel exercises. Pt.required frequent cues for visual demonstration, as well as cues for proper ther. ex. technique and form. Pt. continues to require cues to avoid compensating proximally with hiking his right shoulder, and leaning to the left. Rest breaks were required. Pt. continues to work on improving BUE strength, and [facility] skills in order to improve UE functioning during ADLs, and IADL tasks and maximizing overall independence.   OT Occupational Profile and History Problem Focused Assessment - Including review of records relating to presenting problem    Occupational performance deficits (Please refer to evaluation for details): ADL's;IADL's    Body Structure / Function / Physical Skills ADL;FMC;ROM;IADL;Pain;Coordination    Rehab Potential Fair    Clinical Decision Making Several treatment options, min-mod task modification necessary    Comorbidities Affecting Occupational Performance: May have comorbidities impacting occupational performance    Modification or Assistance to Complete Evaluation  Min-Moderate modification of tasks or assist with assess necessary to complete eval    OT Frequency 2x / week    OT Duration 12 weeks    OT Treatment/Interventions Self-care/ADL training;Patient/family education;DME and/or AE instruction;Therapeutic exercise;Neuromuscular education    Consulted and Agree with Plan of Care Patient           Patient will benefit from skilled therapeutic intervention in order to improve the following deficits and impairments:   Body Structure / Function / Physical Skills: ADL, FMC, ROM, IADL, Pain, Coordination       Visit Diagnosis: Muscle weakness (generalized)  Other lack of coordination    Problem List Patient Active Problem List   Diagnosis Date Noted  . Acute CVA (cerebrovascular accident) ([place]) 05/02/2016  .  Left-sided weakness 05/01/2016    Harrel Carina, MS, OTR/L 10/25/2019, 1:45 PM  [place] MAIN [facility] SERVICES [address] [place], [place], [address] Phone: [phone]   Fax:  [phone]  Name: Curtis Powell MRN: [id number] Date of Birth: 02/03/1966

## 2019-10-25 NOTE — Therapy (Signed)
[place] MAIN [facility] SERVICES [address] [place], [place], [address] Phone: [phone]   Fax:  [phone]  Physical Therapy Treatment  Patient Details  Name: STONY STEGMANN MRN: [id number] Date of Birth: [birth year] No data recorded  Encounter Date: 10/25/2019   PT End of Session - 10/25/19 1349    Visit Number [id number]    Number of Visits 229    Date for PT Re-Evaluation 01/12/20    Authorization Type 6/10 PN 09/27/19    Authorization Time Period authorized 11/16/18-05/19/19    PT Start Time 1345    PT Stop Time 1429    PT Time Calculation (min) 44 min    Equipment Utilized During Treatment Gait belt    Activity Tolerance Patient tolerated treatment well;No increased pain    Behavior During Therapy WFL for tasks assessed/performed           Past Medical History:  Diagnosis Date  . GERD (gastroesophageal reflux disease)   . Hyperlipidemia   . Hypertension   . Obesity   . Stroke [facility])     Past Surgical History:  Procedure Laterality Date  . BRAIN SURGERY      There were no vitals filed for this visit.   Subjective Assessment - 10/25/19 1348    Subjective Reports he fell backwards since last session. Had to call the EMT's to get him back up. Reports he didn't injure himself.    Pertinent History  Patient is a pleasant 54 year old male who presents to physical therapy for weakness and immobility secondary to CVA.  Had a stroke in Feb 2018. He was previously fully independent, but this stroke caused severe residual deficits, mainly on the right side as well as speech, and now he is unable to walk or perform most of his ADLs on his own. Entire right side is very weak. He still has a little difficulty with speech but his swallowing is improved to baseline. His mom had to move in with him and now is his main caregiver.     Limitations Sitting;Lifting;Standing;Walking;House hold activities    How long can you sit comfortably? 5 minutes     How long can you stand comfortably? 5 minutes    How long can you walk comfortably? 10 ft    Patient Stated Goals Walk without walker, walk further with walker, get some strength in back     Currently in Pain? No/denies                Treatment:  Nustep Lvl 3RPM> 80 for cardiovascular challenge 4 minutes    ambulate 183 ft x CGA with RW, cues for foot clearance, upright posture, tactile cueing to PSIS and ASIS for body mechanics and sequencing;        Standing with RW:  -no UE support static stand 30 seconds x 2 trials with dual task of counting by 5's.  -no UE support horizontal head turns 10x, vertical head turns 10x   -reach for a ball on R side, dunk into hoop x20 -stand, walk 10 ft, circle around cone, return to seat ; focus on keeping feet within walker for negotiation of turn    Soccer ball LAQ to PT for timing/sequencing x 8 each LE         Patient requires seated rest breaks between standing interventions due to fatigue, shortness of breath, and elevated HR with exercise.        Pt educated throughout session about  proper posture and technique with exercises. Improved exercise technique, movement at target joints, use of target muscles after min to mod verbal, visual, tactile cues    Patient introduced to negotiation of turns and sequencing of AD for stability with tight negotiation of objects, requiring cueing for sequencing and safety with close CGA. He is improving with stability with single UE support with and without pertubation's. Patient will continue to benefit from skilled therapy in order to increase strength and endurance.                    PT Education - 10/25/19 1349    Education provided Yes    Education Details exercise technique, body mechanics    Person(s) Educated Patient    Methods Explanation;Demonstration;Tactile cues;Verbal cues    Comprehension Verbalized understanding;Returned demonstration;Verbal cues required;Tactile cues  required            PT Short Term Goals - 10/20/19 1722      PT SHORT TERM GOAL #1   Title Patient will be independent in home exercise program to improve strength/mobility for better functional independence with ADLs.    Baseline hep compliant    Time 2    Period Weeks    Status Achieved      PT SHORT TERM GOAL #2   Title Patient will require min cueing for STS transfer with CGA for increased independence with mobility.     Baseline CGA    Time 2    Period Weeks    Status Achieved      PT SHORT TERM GOAL #3   Title Patient will maintain upright posture for > 15 seconds to demonstrate strengthened postural control muscualture     Baseline 18 seconds     Time 2    Period Weeks    Status Achieved             PT Long Term Goals - 10/20/19 0001      PT LONG TERM GOAL #1   Title Patient will perform bed mobility (rolling and supine <>sit) with Mod I  to increase mobility and  perform ADLs.     Baseline 7/14: Min A 4/21: unable to perform 6/15: improving but still min-mod A     Time 12    Period Weeks    Status Partially Met    Target Date 01/12/20      PT LONG TERM GOAL #2   Title Patient (< 86 years old) will complete five times sit to stand test in < 10 seconds indicating an increased LE strength and improved balance.    Baseline  7/14: 14.31 seconds 6/21: 16.86 seconds 4/21: 18.8 seconds 06/02/19: 19.93 seconds one hand on RW, one hand on w/c 12/30: 16 secpmds BUE support 02/11/19: 36 seconds 5/19: 20.6s from [place], one hand on walker and one on WC, 80% upright posture 7/1: 22 seconds one hand on walker one hand on w/c  9/14: 48 seconds with full upright posture    Time 12    Period Weeks    Status Partially Met    Target Date 01/12/20      PT LONG TERM GOAL #3   Title Patient will negotiate a bathroom with a walker and Min A for increased indep in home and external environment    Baseline 7/14: unable to do     Time 12    Period Weeks    Status New    Target Date  01/12/20  PT LONG TERM GOAL #4   Title Patient will increase BLE gross strength to 4+/5 as to improve functional strength for independent gait, increased standing tolerance and increased ADL ability.    Baseline  4/21: see note 06/02/19: hip flexion 4-/5, extension/abd 3/5 12/30: hip flexion 4/5 hip extension and abduction 3/5 02/11/19: hip flexion 4/5 hip extension and abduction 2+/54-/5 RLE, 10/15: 4/5 RLE 11/7: 4/5 RLE 3/3: 4/5 7/1:  hip flexion 4/5 hip extension 2/5 hip abduction/adduciton 2/5     Period Weeks    Status Partially Met    Target Date 01/12/20      PT LONG TERM GOAL #6   Title Patient will ambulate 200 ft with least assistive device and Supervision with no breaks to allow for increased mobility within home.    Baseline 7/14: 190 ft with RW 4/21: ambulate 150 ft 6/21: had to terminate after 90 ft due to cardiac     Time 12    Period Weeks    Status Partially Met    Target Date 01/12/20      PT LONG TERM GOAL #7   Title Patient will perform 10 MWT in <30 seconds for improved gait speed, gait mechanics, and functional capacity for mobility.     Baseline  7/14: 37.98 seconds 6/21: 46.9 seconds with RW 07/28/19: 51 seconds with RW 06/02/19: 62.5 seconds with RW 11/4: 56 seconds with AFO and RW 12/30: 53 seconds with AFO and RW     Time 12    Period Weeks    Status Partially Met    Target Date 01/12/20      PT LONG TERM GOAL #8   Title Patient will increase Berg Balance score by > 6 points (38/56) to demonstrate decreased fall risk during functional activities.    Baseline 7/14: 33/56 07/28/19: 32/56 6/21: 32/56     Time 12    Period Weeks    Status Partially Met    Target Date 01/12/20                 Plan - 10/25/19 1442    Clinical Impression Statement Patient introduced to negotiation of turns and sequencing of AD for stability with tight negotiation of objects, requiring cueing for sequencing and safety with close CGA. He is improving with stability with  single UE support with and without pertubation's. Patient will continue to benefit from skilled therapy in order to increase strength and endurance.    Rehab Potential Fair    Clinical Impairments Affecting Rehab Potential hx of HTN, HLD, CVA, learning disability, Diabetes, brain tumor,     PT Frequency 2x / week    PT Duration 12 weeks    PT Treatment/Interventions ADLs/Self Care Home Management;Aquatic Therapy;Ultrasound;Moist Heat;Traction;DME Instruction;Gait training;Stair training;Functional mobility training;Therapeutic activities;Therapeutic exercise;Orthotic Fit/Training;Neuromuscular re-education;Balance training;Patient/family education;Manual techniques;Wheelchair mobility training;Passive range of motion;Energy conservation;Taping;Visual/perceptual remediation/compensation    PT Next Visit Plan progress standing balance    PT Home Exercise Plan TrA contraction, glute sets: needs updates. Potentially some time in supine flat in hospital bed, to improve hip extension deficits.     Consulted and Agree with Plan of Care Patient           Patient will benefit from skilled therapeutic intervention in order to improve the following deficits and impairments:  Abnormal gait, Decreased activity tolerance, Decreased balance, Decreased knowledge of precautions, Decreased endurance, Decreased coordination, Decreased knowledge of use of DME, Decreased mobility, Decreased range of motion, Difficulty walking, Decreased safety awareness, Decreased strength,  Impaired flexibility, Impaired perceived functional ability, Impaired tone, Postural dysfunction, Improper body mechanics, Pain  Visit Diagnosis: Muscle weakness (generalized)  Other lack of coordination  Other abnormalities of gait and mobility  Hemiplegia and hemiparesis following cerebral infarction affecting right dominant side [facility])     Problem List Patient Active Problem List   Diagnosis Date Noted  . Acute CVA (cerebrovascular  accident) ([place]) 05/02/2016  . Left-sided weakness 05/01/2016   Janna Arch, PT, DPT   10/25/2019, 2:44 PM  [place] MAIN [facility] SERVICES [address] [place], [place], [address] Phone: [phone]   Fax:  [phone]  Name: CORNELUIS ALLSTON MRN: [id number] Date of Birth: January 03, 1966

## 2019-10-27 ENCOUNTER — Ambulatory Visit: Payer: Medicare HMO

## 2019-10-27 ENCOUNTER — Other Ambulatory Visit: Payer: Self-pay

## 2019-10-27 ENCOUNTER — Ambulatory Visit: Payer: Medicare HMO | Admitting: Occupational Therapy

## 2019-10-27 ENCOUNTER — Encounter: Payer: Self-pay | Admitting: Occupational Therapy

## 2019-10-27 DIAGNOSIS — R278 Other lack of coordination: Secondary | ICD-10-CM

## 2019-10-27 DIAGNOSIS — I69351 Hemiplegia and hemiparesis following cerebral infarction affecting right dominant side: Secondary | ICD-10-CM

## 2019-10-27 DIAGNOSIS — M6281 Muscle weakness (generalized): Secondary | ICD-10-CM

## 2019-10-27 DIAGNOSIS — R2689 Other abnormalities of gait and mobility: Secondary | ICD-10-CM

## 2019-10-27 NOTE — Therapy (Signed)
[place] MAIN [facility] SERVICES [address] [place], [place], [address] Phone: [phone]   Fax:  [phone]  Physical Therapy Treatment  Patient Details  Name: Curtis Powell MRN: [id number] Date of Birth: 12-Feb-1966 No data recorded  Encounter Date: 10/27/2019   PT End of Session - 10/27/19 1730    Visit Number [id number]    Number of Visits 229    Date for PT Re-Evaluation 01/12/20    Authorization Type 7/10 PN 09/27/19    Authorization Time Period authorized 11/16/18-05/19/19    PT Start Time 1346    PT Stop Time 1430    PT Time Calculation (min) 44 min    Equipment Utilized During Treatment Gait belt    Activity Tolerance Patient tolerated treatment well;No increased pain    Behavior During Therapy WFL for tasks assessed/performed           Past Medical History:  Diagnosis Date  . GERD (gastroesophageal reflux disease)   . Hyperlipidemia   . Hypertension   . Obesity   . Stroke [facility])     Past Surgical History:  Procedure Laterality Date  . BRAIN SURGERY      There were no vitals filed for this visit.   Subjective Assessment - 10/27/19 1729    Subjective Patient reports no falls or LOB since last session. Has been compliant with HEP.    Pertinent History  Patient is a pleasant 54 year old male who presents to physical therapy for weakness and immobility secondary to CVA.  Had a stroke in Feb 2018. He was previously fully independent, but this stroke caused severe residual deficits, mainly on the right side as well as speech, and now he is unable to walk or perform most of his ADLs on his own. Entire right side is very weak. He still has a little difficulty with speech but his swallowing is improved to baseline. His mom had to move in with him and now is his main caregiver.     Limitations Sitting;Lifting;Standing;Walking;House hold activities    How long can you sit comfortably? 5 minutes    How long can you stand comfortably? 5  minutes    How long can you walk comfortably? 10 ft    Patient Stated Goals Walk without walker, walk further with walker, get some strength in back     Currently in Pain? No/denies                Ambulating with RW:   [id number] x CGA with RW, cues for foot clearance, upright posture, tactile cueing to PSIS and ASIS for body mechanics and sequencing;Able to reach and press button to open door to hallway without assistance     seated without back support on edge of plinth [place] weight weighted ball for core stability, reach and progression of pertubation's with dual task x 6 minutes PVC pipe upright holding, LAQ 10x each LE PVC pipe upright posture marching 15x each LE STS with SUE support 23.5 inch 5x,    Static stand w RW in front for stability: close CGA  Body part reach: head, shoulder, thigh, knee etc in mixed order for return to COM; x 3 minutes  PVC pipe chest press ; 10x, occasional min A to return to COM   no UE support static stand 30 seconds x 2 trials with dual task of counting by 5's.    Pt educated throughout session about proper posture and technique with exercises. Improved exercise  technique, movement at target joints, use of target muscles after min to mod verbal, visual, tactile cues             PT Education - 10/27/19 1729    Education provided Yes    Education Details exercise technique, body mechanics    Person(s) Educated Patient    Methods Explanation;Demonstration;Tactile cues;Verbal cues    Comprehension Verbalized understanding;Returned demonstration;Verbal cues required;Tactile cues required            PT Short Term Goals - 10/20/19 1722      PT SHORT TERM GOAL #1   Title Patient will be independent in home exercise program to improve strength/mobility for better functional independence with ADLs.    Baseline hep compliant    Time 2    Period Weeks    Status Achieved      PT SHORT TERM GOAL #2   Title Patient  will require min cueing for STS transfer with CGA for increased independence with mobility.     Baseline CGA    Time 2    Period Weeks    Status Achieved      PT SHORT TERM GOAL #3   Title Patient will maintain upright posture for > 15 seconds to demonstrate strengthened postural control muscualture     Baseline 18 seconds     Time 2    Period Weeks    Status Achieved             PT Long Term Goals - 10/20/19 0001      PT LONG TERM GOAL #1   Title Patient will perform bed mobility (rolling and supine <>sit) with Mod I  to increase mobility and  perform ADLs.     Baseline 7/14: Min A 4/21: unable to perform 6/15: improving but still min-mod A     Time 12    Period Weeks    Status Partially Met    Target Date 01/12/20      PT LONG TERM GOAL #2   Title Patient (< 32 years old) will complete five times sit to stand test in < 10 seconds indicating an increased LE strength and improved balance.    Baseline  7/14: 14.31 seconds 6/21: 16.86 seconds 4/21: 18.8 seconds 06/02/19: 19.93 seconds one hand on RW, one hand on w/c 12/30: 16 secpmds BUE support 02/11/19: 36 seconds 5/19: 20.6s from [place], one hand on walker and one on WC, 80% upright posture 7/1: 22 seconds one hand on walker one hand on w/c  9/14: 48 seconds with full upright posture    Time 12    Period Weeks    Status Partially Met    Target Date 01/12/20      PT LONG TERM GOAL #3   Title Patient will negotiate a bathroom with a walker and Min A for increased indep in home and external environment    Baseline 7/14: unable to do     Time 12    Period Weeks    Status New    Target Date 01/12/20      PT LONG TERM GOAL #4   Title Patient will increase BLE gross strength to 4+/5 as to improve functional strength for independent gait, increased standing tolerance and increased ADL ability.    Baseline  4/21: see note 06/02/19: hip flexion 4-/5, extension/abd 3/5 12/30: hip flexion 4/5 hip extension and abduction 3/5 02/11/19: hip  flexion 4/5 hip extension and abduction 2+/54-/5 RLE, 10/15: 4/5 RLE 11/7: 4/5  RLE 3/3: 4/5 7/1:  hip flexion 4/5 hip extension 2/5 hip abduction/adduciton 2/5     Period Weeks    Status Partially Met    Target Date 01/12/20      PT LONG TERM GOAL #6   Title Patient will ambulate 200 ft with least assistive device and Supervision with no breaks to allow for increased mobility within home.    Baseline 7/14: 190 ft with RW 4/21: ambulate 150 ft 6/21: had to terminate after 90 ft due to cardiac     Time 12    Period Weeks    Status Partially Met    Target Date 01/12/20      PT LONG TERM GOAL #7   Title Patient will perform 10 MWT in <30 seconds for improved gait speed, gait mechanics, and functional capacity for mobility.     Baseline  7/14: 37.98 seconds 6/21: 46.9 seconds with RW 07/28/19: 51 seconds with RW 06/02/19: 62.5 seconds with RW 11/4: 56 seconds with AFO and RW 12/30: 53 seconds with AFO and RW     Time 12    Period Weeks    Status Partially Met    Target Date 01/12/20      PT LONG TERM GOAL #8   Title Patient will increase Berg Balance score by > 6 points (38/56) to demonstrate decreased fall risk during functional activities.    Baseline 7/14: 33/56 07/28/19: 32/56 6/21: 32/56     Time 12    Period Weeks    Status Partially Met    Target Date 01/12/20                 Plan - 10/27/19 1730    Clinical Impression Statement Patient is progressing with functional stability with decreasing UE support. Decreased need for PT assistance to return to [facility] without UE support required this session as well as improved tolerance of prolonged standing. Introduction to single arm transfers performed from raised surface which was tolerated but very fatiguing. Patient will continue to benefit from skilled therapy in order to increase strength and endurance.    Rehab Potential Fair    Clinical Impairments Affecting Rehab Potential hx of HTN, HLD, CVA, learning disability, Diabetes, brain  tumor,     PT Frequency 2x / week    PT Duration 12 weeks    PT Treatment/Interventions ADLs/Self Care Home Management;Aquatic Therapy;Ultrasound;Moist Heat;Traction;DME Instruction;Gait training;Stair training;Functional mobility training;Therapeutic activities;Therapeutic exercise;Orthotic Fit/Training;Neuromuscular re-education;Balance training;Patient/family education;Manual techniques;Wheelchair mobility training;Passive range of motion;Energy conservation;Taping;Visual/perceptual remediation/compensation    PT Next Visit Plan progress standing balance    PT Home Exercise Plan TrA contraction, glute sets: needs updates. Potentially some time in supine flat in hospital bed, to improve hip extension deficits.     Consulted and Agree with Plan of Care Patient           Patient will benefit from skilled therapeutic intervention in order to improve the following deficits and impairments:  Abnormal gait, Decreased activity tolerance, Decreased balance, Decreased knowledge of precautions, Decreased endurance, Decreased coordination, Decreased knowledge of use of DME, Decreased mobility, Decreased range of motion, Difficulty walking, Decreased safety awareness, Decreased strength, Impaired flexibility, Impaired perceived functional ability, Impaired tone, Postural dysfunction, Improper body mechanics, Pain  Visit Diagnosis: Muscle weakness (generalized)  Other lack of coordination  Other abnormalities of gait and mobility  Hemiplegia and hemiparesis following cerebral infarction affecting right dominant side [facility])     Problem List Patient Active Problem List   Diagnosis Date Noted  .  Acute CVA (cerebrovascular accident) ([place]) 05/02/2016  . Left-sided weakness 05/01/2016   Janna Arch, PT, DPT   10/27/2019, 5:31 PM  [place] MAIN [facility] SERVICES [address] [place], [place], [address] Phone: [phone]   Fax:  [phone]  Name: Curtis Powell MRN: [id number] Date of Birth: 01-Jan-1966

## 2019-10-27 NOTE — Therapy (Addendum)
[place] MAIN [facility] SERVICES [address] [place], [place], [address] Phone: [phone]   Fax:  [phone]  Occupational Therapy Progress Note  Dates of reporting period  09/20/2019   to   10/27/2019  Patient Details  Name: Curtis Powell MRN: [id number] Date of Birth: Feb 19, 1966 No data recorded  Encounter Date: 10/27/2019   OT End of Session - 10/27/19 1308    Visit Number [id number]    Number of Visits 234    Date for OT Re-Evaluation 01/10/20    OT Start Time 1300    OT Stop Time 1345    OT Time Calculation (min) 45 min    Activity Tolerance Patient tolerated treatment well    Behavior During Therapy [facility] for tasks assessed/performed           Past Medical History:  Diagnosis Date  . GERD (gastroesophageal reflux disease)   . Hyperlipidemia   . Hypertension   . Obesity   . Stroke [facility])     Past Surgical History:  Procedure Laterality Date  . BRAIN SURGERY      There were no vitals filed for this visit.   Subjective Assessment - 10/27/19 1307    Subjective  Pt. reports being excited about his therapy.    Patient is accompanied by: Family member    Pertinent History Pt. is a 54 y.o. male who suffered a CVA on 05/01/2016. Pt. was admitted to the hospital. Once discharged, he received Home Health PT and OT services for about a month. Pt. has had multiple CVAs over the past 8 years, and has had multiple falls in the past 6 months. Pt. resides in an apartment. Pt. has caregivers for 80 hours. Pt.'s mother stays with pt. at night, and assists with IADL tasks.            OT TREATMENT  Therapeutic Ex.  Pt. performed 2.5# dowel ex.for UE strengthening secondary to weakness. Bilateral shoulder flexion, chest press, circular patterns, and elbow flexion/extension were performed for 1 to 2 sets of 20 reps. Pt. requires rest breaks and verbal cues for proper technique.Pt. worked on [employer] in preparation for reaching, and  lifting items during ADLs, and IADL tasks. 3# dumbbell ex. for elbow flexion and extension, forearm supination/pronation, wrist flexion/extension, and radial deviation. Pt. requires rest breaks and verbal cues for proper technique.  Response to Treatment:  Pt. continues to make excellent progress with UE strength, and bilateral hand function. Pt. is progressing towards goals. Pt.requiredfrequentcues for visual demonstration, as well ascues for proper ther.ex. technique and form. Pt. continues to require cues to avoid compensating proximally with hiking his right shoulder, and leaning to the left. Rest breaks were required. Pt.continues to work on improving BUE strength, and [facility] skills in order to improve UE functioning during ADLs, and IADLtasksand maximizing overall independence with self- dressing skills, reaching into cabinetry, sewing, and ironing.                         OT Education - 10/27/19 1308    Education provided Yes    Education Details handwriting skills    Person(s) Educated Patient    Methods Explanation;Demonstration    Comprehension Verbalized understanding;Returned demonstration;Verbal cues required            OT Short Term Goals - 03/03/17 1459      OT SHORT TERM GOAL #1   Title `  OT Long Term Goals - 10/27/19 1314      OT LONG TERM GOAL #4   Title Pt. will perform self dressing with minA and A/E as needed.    Baseline Pt. continues to be able to donn a T shirt independently with increased time. Min-modA donning a jacket, Mod A donning pants over his feet, and socks, minA with shoes.    Time 12    Period Weeks    Status Partially Met    Target Date 01/10/20      OT LONG TERM GOAL #6   Title Pt. will write his name efficiently with 100% legibility    Baseline Pt. continues to present with 75% legibility with increased time required when printing his name, and 50% legibility when cursive writing his name to sign in  when riding the [place]    Time 12    Period Weeks    Status On-going    Target Date 01/10/20      OT LONG TERM GOAL #7   Title Pt will independently and consistently follow HEP to increase UE strength to increase functional independence    Baseline Pt. continues to work towards independence with exercises    Time 12    Period Weeks    Status On-going    Target Date 01/10/20      OT LONG TERM GOAL #8   Title Pt. will require supervision ironing a shirt.    Baseline Pt. has progressed CGA, and complete set-up seated using a tabletop iron    Time 12    Period Weeks    Status On-going    Target Date 01/10/20      OT LONG TERM GOAL  #9   Baseline Pt. will independently be able to sew a button onto a shirt.    Time 12    Period Weeks    Status On-going    Target Date 01/10/20      OT LONG TERM GOAL  #10   TITLE Pt. will independently be able to throw a ball    Baseline Pt. continues to be limited with throwing overhand, however pt. is able to perform underhand throwing.    Time 12    Period Weeks    Status On-going    Target Date 01/10/20      OT LONG TERM GOAL  #11   TITLE Pt. will improve UE functional reaching to be able to independently use his RUE to hand his clothes in the closest.    Baseline Pt. continues to improve with reaching up with the RUE, however has difficulty reaching the hanger rack in the closet.    Time 12    Period Weeks    Status On-going    Target Date 01/10/20      OT LONG TERM GOAL  #13   TITLE Pt. will increase BUE strength to be able to prepare for assisting with yardwork.    Baseline Pt. BUE strength 4-/5. Pt. reports that he has been able to get out to the front.    Time 12    Period Weeks    Status On-going                 Plan - 10/27/19 1312    Clinical Impression Statement Pt. continues to make excellent progress with UE strength, and bilateral hand function. Pt. is progressing towards goals. Pt.requiredfrequentcues for visual  demonstration, as well ascues for proper ther.ex. technique and form. Pt. continues to require  cues to avoid compensating proximally with hiking his right shoulder, and leaning to the left. Rest breaks were required. Pt.continues to work on improving BUE strength, and [facility] skills in order to improve UE functioning during ADLs, and IADLtasksand maximizing overall independence with self- dressing skills, reaching into cabinetry, sewing, and ironing.   OT Occupational Profile and History Problem Focused Assessment - Including review of records relating to presenting problem    Occupational performance deficits (Please refer to evaluation for details): ADL's;IADL's    Body Structure / Function / Physical Skills ADL;FMC;ROM;IADL;Pain;Coordination    Rehab Potential Fair    Clinical Decision Making Several treatment options, min-mod task modification necessary    Comorbidities Affecting Occupational Performance: May have comorbidities impacting occupational performance    Modification or Assistance to Complete Evaluation  Min-Moderate modification of tasks or assist with assess necessary to complete eval    OT Frequency 2x / week    OT Duration 12 weeks    OT Treatment/Interventions Self-care/ADL training;Patient/family education;DME and/or AE instruction;Therapeutic exercise;Neuromuscular education    Consulted and Agree with Plan of Care Patient           Patient will benefit from skilled therapeutic intervention in order to improve the following deficits and impairments:   Body Structure / Function / Physical Skills: ADL, FMC, ROM, IADL, Pain, Coordination       Visit Diagnosis: Muscle weakness (generalized)  Other lack of coordination    Problem List Patient Active Problem List   Diagnosis Date Noted  . Acute CVA (cerebrovascular accident) ([place]) 05/02/2016  . Left-sided weakness 05/01/2016    Harrel Carina, MS, OTR/L 10/27/2019, 1:27 PM  [place] MAIN [facility] SERVICES [address] [place], [place], [address] Phone: [phone]   Fax:  [phone]  Name: AXTEN PASCUCCI MRN: [id number] Date of Birth: 1965-09-10

## 2019-11-01 ENCOUNTER — Ambulatory Visit: Payer: Medicare HMO | Admitting: Occupational Therapy

## 2019-11-01 ENCOUNTER — Encounter: Payer: Self-pay | Admitting: Occupational Therapy

## 2019-11-01 ENCOUNTER — Other Ambulatory Visit: Payer: Self-pay

## 2019-11-01 ENCOUNTER — Ambulatory Visit: Payer: Medicare HMO

## 2019-11-01 DIAGNOSIS — M6281 Muscle weakness (generalized): Secondary | ICD-10-CM

## 2019-11-01 DIAGNOSIS — I69351 Hemiplegia and hemiparesis following cerebral infarction affecting right dominant side: Secondary | ICD-10-CM

## 2019-11-01 DIAGNOSIS — R2689 Other abnormalities of gait and mobility: Secondary | ICD-10-CM

## 2019-11-01 DIAGNOSIS — R278 Other lack of coordination: Secondary | ICD-10-CM

## 2019-11-01 NOTE — Therapy (Signed)
[facility] SERVICES [address] [place], [place], [address] Phone: [phone]   Fax:  [phone]  Physical Therapy Treatment  Patient Details  Name: Curtis Powell MRN: [id number] Date of Birth: 02-09-1966 No data recorded  Encounter Date: 11/01/2019   PT End of Session - 11/01/19 1354    Visit Number [id number]    Number of Visits 229    Date for PT Re-Evaluation 01/12/20    Authorization Type 8/10 PN 09/27/19    Authorization Time Period authorized 11/16/18-05/19/19    PT Start Time 1346    PT Stop Time 1430    PT Time Calculation (min) 44 min    Equipment Utilized During Treatment Gait belt    Activity Tolerance Patient tolerated treatment well;No increased pain    Behavior During Therapy WFL for tasks assessed/performed           Past Medical History:  Diagnosis Date  . GERD (gastroesophageal reflux disease)   . Hyperlipidemia   . Hypertension   . Obesity   . Stroke [name]     Past Surgical History:  Procedure Laterality Date  . BRAIN SURGERY      There were no vitals filed for this visit.   Subjective Assessment - 11/01/19 1352    Subjective Patient reports he had a fall on saturday, was leaning out of his chair to pick up a pen and fell out. Was not hurt. Prior to his session patient had an "accident' requiring changing into hospital gown/paper clothing .    Pertinent History  Patient is a pleasant 54 year old male who presents to physical therapy for weakness and immobility secondary to CVA.  Had a stroke in Feb 2018. He was previously fully independent, but this stroke caused severe residual deficits, mainly on the right side as well as speech, and now he is unable to walk or perform most of his ADLs on his own. Entire right side is very weak. He still has a little difficulty with speech but his swallowing is improved to baseline. His mom had to move in with him and now is his Powell caregiver.     Limitations  Sitting;Lifting;Standing;Walking;House hold activities    How long can you sit comfortably? 5 minutes    How long can you stand comfortably? 5 minutes    How long can you walk comfortably? 10 ft    Patient Stated Goals Walk without walker, walk further with walker, get some strength in back     Currently in Pain? No/denies              Patient unable to perform standing interventions this session due to accident resulting in need for wearing gown/paper shorts and briefs that won't stay up when standing.   Seated:  Superset 1: Performed two times total  5lb ankle weight: -Exercise 1: high knee march with upright posture 20x each LE,  -Exercise 2: LAQ 12x 3 second holds  Superset 2: performed two times total Exercise 1: 5lb ankle weight step out lateral 12x each LE  Exercise 2: forward reach and return to sitting COM 10 x   Superset 3: performed two times total Exercise 1: 5lb ankle weight: tap the line on the floor 30 seconds alternating ; cues for coordination, challenging for RLE >LLE  Exercise 2: modified windmill 10x each side  Superset 4: performed two times  Exercise 1: straight leg abduction/adduction 12x; cues for not letting knee flex  Exercise 2:  upright posture shadow boxing 30 seconds   Balloon taps inside/outside BOS x 3 minutes for core stability and pertubation's      Pt educated throughout session about proper posture and technique with exercises. Improved exercise technique, movement at target joints, use of target muscles after min to mod verbal, visual, tactile cues               PT Education - 11/01/19 1353    Education provided Yes    Education Details exerise technique, body mechanics,    Person(s) Educated Patient    Methods Explanation;Demonstration;Tactile cues;Verbal cues    Comprehension Verbalized understanding;Returned demonstration;Verbal cues required;Tactile cues required            PT Short Term Goals - 10/20/19 1722       PT SHORT TERM GOAL #1   Title Patient will be independent in home exercise program to improve strength/mobility for better functional independence with ADLs.    Baseline hep compliant    Time 2    Period Weeks    Status Achieved      PT SHORT TERM GOAL #2   Title Patient will require min cueing for STS transfer with CGA for increased independence with mobility.     Baseline CGA    Time 2    Period Weeks    Status Achieved      PT SHORT TERM GOAL #3   Title Patient will maintain upright posture for > 15 seconds to demonstrate strengthened postural control muscualture     Baseline 18 seconds     Time 2    Period Weeks    Status Achieved             PT Long Term Goals - 10/20/19 0001      PT LONG TERM GOAL #1   Title Patient will perform bed mobility (rolling and supine <>sit) with Mod I  to increase mobility and  perform ADLs.     Baseline 7/14: Min A 4/21: unable to perform 6/15: improving but still min-mod A     Time 12    Period Weeks    Status Partially Met    Target Date 01/12/20      PT LONG TERM GOAL #2   Title Patient (< 34 years old) will complete five times sit to stand test in < 10 seconds indicating an increased LE strength and improved balance.    Baseline  7/14: 14.31 seconds 6/21: 16.86 seconds 4/21: 18.8 seconds 06/02/19: 19.93 seconds one hand on RW, one hand on w/c 12/30: 16 secpmds BUE support 02/11/19: 36 seconds 5/19: 20.6s from [place], one hand on walker and one on WC, 80% upright posture 7/1: 22 seconds one hand on walker one hand on w/c  9/14: 48 seconds with full upright posture    Time 12    Period Weeks    Status Partially Met    Target Date 01/12/20      PT LONG TERM GOAL #3   Title Patient will negotiate a bathroom with a walker and Min A for increased indep in home and external environment    Baseline 7/14: unable to do     Time 12    Period Weeks    Status New    Target Date 01/12/20      PT LONG TERM GOAL #4   Title Patient will increase  BLE gross strength to 4+/5 as to improve functional strength for independent gait, increased standing tolerance and increased  ADL ability.    Baseline  4/21: see note 06/02/19: hip flexion 4-/5, extension/abd 3/5 12/30: hip flexion 4/5 hip extension and abduction 3/5 02/11/19: hip flexion 4/5 hip extension and abduction 2+/54-/5 RLE, 10/15: 4/5 RLE 11/7: 4/5 RLE 3/3: 4/5 7/1:  hip flexion 4/5 hip extension 2/5 hip abduction/adduciton 2/5     Period Weeks    Status Partially Met    Target Date 01/12/20      PT LONG TERM GOAL #6   Title Patient will ambulate 200 ft with least assistive device and Supervision with no breaks to allow for increased mobility within home.    Baseline 7/14: 190 ft with RW 4/21: ambulate 150 ft 6/21: had to terminate after 90 ft due to cardiac     Time 12    Period Weeks    Status Partially Met    Target Date 01/12/20      PT LONG TERM GOAL #7   Title Patient will perform 10 MWT in <30 seconds for improved gait speed, gait mechanics, and functional capacity for mobility.     Baseline  7/14: 37.98 seconds 6/21: 46.9 seconds with RW 07/28/19: 51 seconds with RW 06/02/19: 62.5 seconds with RW 11/4: 56 seconds with AFO and RW 12/30: 53 seconds with AFO and RW     Time 12    Period Weeks    Status Partially Met    Target Date 01/12/20      PT LONG TERM GOAL #8   Title Patient will increase Berg Balance score by > 6 points (38/56) to demonstrate decreased fall risk during functional activities.    Baseline 7/14: 33/56 07/28/19: 32/56 6/21: 32/56     Time 12    Period Weeks    Status Partially Met    Target Date 01/12/20                 Plan - 11/01/19 1410    Clinical Impression Statement Patient unable to perform standing interventions this session due to accident resulting in need for wearing gown/paper shorts and briefs that won't stay up when standing. Seated intervention focused on increasing strength and capacity for mobility with patient reporting fatigue  by end of session. Patient will continue to benefit from skilled therapy in order to increase strength and endurance.    Rehab Potential Fair    Clinical Impairments Affecting Rehab Potential hx of HTN, HLD, CVA, learning disability, Diabetes, brain tumor,     PT Frequency 2x / week    PT Duration 12 weeks    PT Treatment/Interventions ADLs/Self Care Home Management;Aquatic Therapy;Ultrasound;Moist Heat;Traction;DME Instruction;Gait training;Stair training;Functional mobility training;Therapeutic activities;Therapeutic exercise;Orthotic Fit/Training;Neuromuscular re-education;Balance training;Patient/family education;Manual techniques;Wheelchair mobility training;Passive range of motion;Energy conservation;Taping;Visual/perceptual remediation/compensation    PT Next Visit Plan progress standing balance    PT Home Exercise Plan TrA contraction, glute sets: needs updates. Potentially some time in supine flat in hospital bed, to improve hip extension deficits.     Consulted and Agree with Plan of Care Patient           Patient will benefit from skilled therapeutic intervention in order to improve the following deficits and impairments:  Abnormal gait, Decreased activity tolerance, Decreased balance, Decreased knowledge of precautions, Decreased endurance, Decreased coordination, Decreased knowledge of use of DME, Decreased mobility, Decreased range of motion, Difficulty walking, Decreased safety awareness, Decreased strength, Impaired flexibility, Impaired perceived functional ability, Impaired tone, Postural dysfunction, Improper body mechanics, Pain  Visit Diagnosis: Muscle weakness (generalized)  Other lack of  coordination  Other abnormalities of gait and mobility  Hemiplegia and hemiparesis following cerebral infarction affecting right dominant side [facility])     Problem List Patient Active Problem List   Diagnosis Date Noted  . Acute CVA (cerebrovascular accident) ([place]) 05/02/2016  .  Left-sided weakness 05/01/2016   Janna Arch, PT, DPT   11/01/2019, 2:31 PM  [place] [facility] SERVICES [address] [place], [place], [address] Phone: [phone]   Fax:  [phone]  Name: Curtis Powell MRN: [id number] Date of Birth: 04/12/1965

## 2019-11-01 NOTE — Therapy (Addendum)
[place] MAIN [facility] SERVICES [address] [place], [place], [address] Phone: [phone]   Fax:  [phone]  Occupational Therapy Treatment  Patient Details  Name: Curtis Powell MRN: [id number] Date of Birth: 1966-04-03 No data recorded  Encounter Date: 11/01/2019   OT End of Session - 11/01/19 1616    Visit Number [id number]    Number of Visits 234    Date for OT Re-Evaluation 01/10/20    Authorization Type Progress reporting period starting 09/15/2019    OT Start Time 1300    OT Stop Time 1345    OT Time Calculation (min) 45 min    Activity Tolerance Patient tolerated treatment well    Behavior During Therapy [facility] for tasks assessed/performed           Past Medical History:  Diagnosis Date  . GERD (gastroesophageal reflux disease)   . Hyperlipidemia   . Hypertension   . Obesity   . Stroke [facility])     Past Surgical History:  Procedure Laterality Date  . BRAIN SURGERY      There were no vitals filed for this visit.   Subjective Assessment - 11/01/19 1615    Subjective  Pt. reports being excited about his therapy.    Patient is accompanied by: Family member    Currently in Pain? No/denies           OT TREATMENT    Self-care:  Pt. required modA-maxA donning shorts over his feet. Pt. required modA donning a shirt. Pt. was independently able to perform toileting tasks while standing at the commode. Pt. was able to independently use body washcloth wipes. Pt. was able to independently perform hand hygiene following toileting.  Pt. arrived with soiled clothing. Pt. reports that he did not make it to the bathroom in time. Pt. was provided with, and assisted with a change of clothes/gown. Pt. continues to require assist with UE, and LE dressing skills. Pt. continues to work on reviewing A/E use for LE ADLS, as well as improving UE strength, coordination, and  hand function skills in order in order to increase independence with ADLs, and  IADL tasks.                            OT Education - 11/01/19 1616    Education provided Yes    Education Details Agricultural engineer) Educated Patient    Methods Explanation;Demonstration    Comprehension Verbalized understanding;Returned demonstration;Verbal cues required            OT Short Term Goals - 03/03/17 1459      OT SHORT TERM GOAL #1   Title `             OT Long Term Goals - 10/27/19 1314      OT LONG TERM GOAL #4   Title Pt. will perform self dressing with minA and A/E as needed.    Baseline Pt. continues to be able to donn a T shirt independently with increased time. Min-modA donning a jacket, Mod A donning pants over his feet, and socks, minA with shoes.    Time 12    Period Weeks    Status Partially Met    Target Date 01/10/20      OT LONG TERM GOAL #6   Title Pt. will write his name efficiently with 100% legibility    Baseline Pt. continues to present with 75%  legibility with increased time required when printing his name, and 50% legibility when cursive writing his name to sign in when riding the [place]    Time 12    Period Weeks    Status On-going    Target Date 01/10/20      OT LONG TERM GOAL #7   Title Pt will independently and consistently follow HEP to increase UE strength to increase functional independence    Baseline Pt. continues to work towards independence with exercises    Time 12    Period Weeks    Status On-going    Target Date 01/10/20      OT LONG TERM GOAL #8   Title Pt. will require supervision ironing a shirt.    Baseline Pt. has progressed CGA, and complete set-up seated using a tabletop iron    Time 12    Period Weeks    Status On-going    Target Date 01/10/20      OT LONG TERM GOAL  #9   Baseline Pt. will independently be able to sew a button onto a shirt.    Time 12    Period Weeks    Status On-going    Target Date 01/10/20      OT LONG TERM GOAL  #10   TITLE Pt. will  independently be able to throw a ball    Baseline Pt. continues to be limited with throwing overhand, however pt. is able to perform underhand throwing.    Time 12    Period Weeks    Status On-going    Target Date 01/10/20      OT LONG TERM GOAL  #11   TITLE Pt. will improve UE functional reaching to be able to independently use his RUE to hand his clothes in the closest.    Baseline Pt. continues to improve with reaching up with the RUE, however has difficulty reaching the hanger rack in the closet.    Time 12    Period Weeks    Status On-going    Target Date 01/10/20      OT LONG TERM GOAL  #13   TITLE Pt. will increase BUE strength to be able to prepare for assisting with yardwork.    Baseline Pt. BUE strength 4-/5. Pt. reports that he has been able to get out to the front.    Time 12    Period Weeks    Status On-going                 Plan - 11/01/19 1616    Clinical Impression Statement Pt. arrived with soiled clothing. Pt. reports that he did not make it to the bathroom in time. Pt. was provided with, and assisted with a change of clothes/gown. Pt. continues to require assist with UE, and LE dressing skills. Pt. continues to work on reviewing A/E use for LE ADLS, as well as improving UE strength, coordination, and  hand function skills in order in order to increase independence with ADLs, and IADL tasks.    OT Occupational Profile and History Problem Focused Assessment - Including review of records relating to presenting problem    Occupational performance deficits (Please refer to evaluation for details): ADL's;IADL's    Body Structure / Function / Physical Skills ADL;FMC;ROM;IADL;Pain;Coordination    Rehab Potential Fair    Comorbidities Affecting Occupational Performance: May have comorbidities impacting occupational performance    Modification or Assistance to Complete Evaluation  Min-Moderate modification of tasks or assist with  assess necessary to complete eval    OT  Frequency 2x / week    OT Duration 12 weeks    OT Treatment/Interventions Self-care/ADL training;Patient/family education;DME and/or AE instruction;Therapeutic exercise;Neuromuscular education    Consulted and Agree with Plan of Care Patient           Patient will benefit from skilled therapeutic intervention in order to improve the following deficits and impairments:   Body Structure / Function / Physical Skills: ADL, FMC, ROM, IADL, Pain, Coordination       Visit Diagnosis: Muscle weakness (generalized)    Problem List Patient Active Problem List   Diagnosis Date Noted  . Acute CVA (cerebrovascular accident) ([place]) 05/02/2016  . Left-sided weakness 05/01/2016    Harrel Carina, MS, OTR/L 11/01/2019, 4:18 PM  [place] MAIN [facility] SERVICES [address] [place], [place], [address] Phone: [phone]   Fax:  [phone]  Name: NASIF BOS MRN: [id number] Date of Birth: Jun 22, 1965

## 2019-11-03 ENCOUNTER — Encounter: Payer: Self-pay | Admitting: Occupational Therapy

## 2019-11-03 ENCOUNTER — Ambulatory Visit: Payer: Medicare HMO

## 2019-11-03 ENCOUNTER — Other Ambulatory Visit: Payer: Self-pay

## 2019-11-03 ENCOUNTER — Ambulatory Visit: Payer: Medicare HMO | Admitting: Occupational Therapy

## 2019-11-03 DIAGNOSIS — M6281 Muscle weakness (generalized): Secondary | ICD-10-CM | POA: Diagnosis not present

## 2019-11-03 DIAGNOSIS — R2689 Other abnormalities of gait and mobility: Secondary | ICD-10-CM

## 2019-11-03 DIAGNOSIS — R278 Other lack of coordination: Secondary | ICD-10-CM

## 2019-11-03 DIAGNOSIS — I69351 Hemiplegia and hemiparesis following cerebral infarction affecting right dominant side: Secondary | ICD-10-CM

## 2019-11-03 NOTE — Therapy (Signed)
[place] MAIN [facility] SERVICES [address] [place], [place], [address] Phone: [phone]   Fax:  [phone]  Physical Therapy Treatment  Patient Details  Name: Curtis Powell MRN: [id number] Date of Birth: 23-Dec-1965 No data recorded  Encounter Date: 11/03/2019   PT End of Session - 11/03/19 1353    Visit Number [id number]    Number of Visits 229    Date for PT Re-Evaluation 01/12/20    Authorization Type 9/10 PN 09/27/19    Authorization Time Period authorized 11/16/18-05/19/19    PT Start Time 1346    PT Stop Time 1429    PT Time Calculation (min) 43 min    Equipment Utilized During Treatment Gait belt    Activity Tolerance Patient tolerated treatment well;No increased pain    Behavior During Therapy WFL for tasks assessed/performed           Past Medical History:  Diagnosis Date  . GERD (gastroesophageal reflux disease)   . Hyperlipidemia   . Hypertension   . Obesity   . Stroke [facility])     Past Surgical History:  Procedure Laterality Date  . BRAIN SURGERY      There were no vitals filed for this visit.   Subjective Assessment - 11/03/19 1352    Subjective Patient is having a hard time using the restroom with his new wheelchair due to footrest position. No falls or LOB since last session.    Pertinent History  Patient is a pleasant 54 year old male who presents to physical therapy for weakness and immobility secondary to CVA.  Had a stroke in Feb 2018. He was previously fully independent, but this stroke caused severe residual deficits, mainly on the right side as well as speech, and now he is unable to walk or perform most of his ADLs on his own. Entire right side is very weak. He still has a little difficulty with speech but his swallowing is improved to baseline. His mom had to move in with him and now is his main caregiver.     Limitations Sitting;Lifting;Standing;Walking;House hold activities    How long can you sit comfortably? 5  minutes    How long can you stand comfortably? 5 minutes    How long can you walk comfortably? 10 ft    Patient Stated Goals Walk without walker, walk further with walker, get some strength in back     Currently in Pain? No/denies                Ambulating with RW:     ambulate 141 ft x CGA with RW, cues for foot clearance, upright posture, tactile cueing to PSIS and ASIS for body mechanics and sequencing; Able to reach and press button to open door to hallway without assistance    seated in w/c  5lb ankle weight -LAQ 15x each LE    Static stand w RW in front for stability: close CGA  Stand with shadow box, occasional Min A to return to [facility], able to perform two consecutive prior ot needing to return to COM x 3 minutes 5lb ankle weight: marching 10x each Le, cues for upright posture   education and performance of pushing foot rests away and together to stand up and sit down.      Pt educated throughout session about proper posture and technique with exercises. Improved exercise technique, movement at target joints, use of target muscles after min to mod verbal, visual, tactile cues  PT Education - 11/03/19 1353    Education provided Yes    Education Details exercise technique, body mechanics    Person(s) Educated Patient    Methods Explanation;Demonstration;Verbal cues;Tactile cues    Comprehension Verbalized understanding;Returned demonstration;Verbal cues required;Tactile cues required            PT Short Term Goals - 10/20/19 1722      PT SHORT TERM GOAL #1   Title Patient will be independent in home exercise program to improve strength/mobility for better functional independence with ADLs.    Baseline hep compliant    Time 2    Period Weeks    Status Achieved      PT SHORT TERM GOAL #2   Title Patient will require min cueing for STS transfer with CGA for increased independence with mobility.     Baseline CGA    Time 2     Period Weeks    Status Achieved      PT SHORT TERM GOAL #3   Title Patient will maintain upright posture for > 15 seconds to demonstrate strengthened postural control muscualture     Baseline 18 seconds     Time 2    Period Weeks    Status Achieved             PT Long Term Goals - 10/20/19 0001      PT LONG TERM GOAL #1   Title Patient will perform bed mobility (rolling and supine <>sit) with Mod I  to increase mobility and  perform ADLs.     Baseline 7/14: Min A 4/21: unable to perform 6/15: improving but still min-mod A     Time 12    Period Weeks    Status Partially Met    Target Date 01/12/20      PT LONG TERM GOAL #2   Title Patient (< 31 years old) will complete five times sit to stand test in < 10 seconds indicating an increased LE strength and improved balance.    Baseline  7/14: 14.31 seconds 6/21: 16.86 seconds 4/21: 18.8 seconds 06/02/19: 19.93 seconds one hand on RW, one hand on w/c 12/30: 16 secpmds BUE support 02/11/19: 36 seconds 5/19: 20.6s from [place], one hand on walker and one on WC, 80% upright posture 7/1: 22 seconds one hand on walker one hand on w/c  9/14: 48 seconds with full upright posture    Time 12    Period Weeks    Status Partially Met    Target Date 01/12/20      PT LONG TERM GOAL #3   Title Patient will negotiate a bathroom with a walker and Min A for increased indep in home and external environment    Baseline 7/14: unable to do     Time 12    Period Weeks    Status New    Target Date 01/12/20      PT LONG TERM GOAL #4   Title Patient will increase BLE gross strength to 4+/5 as to improve functional strength for independent gait, increased standing tolerance and increased ADL ability.    Baseline  4/21: see note 06/02/19: hip flexion 4-/5, extension/abd 3/5 12/30: hip flexion 4/5 hip extension and abduction 3/5 02/11/19: hip flexion 4/5 hip extension and abduction 2+/54-/5 RLE, 10/15: 4/5 RLE 11/7: 4/5 RLE 3/3: 4/5 7/1:  hip flexion 4/5 hip  extension 2/5 hip abduction/adduciton 2/5     Period Weeks    Status Partially Met  Target Date 01/12/20      PT LONG TERM GOAL #6   Title Patient will ambulate 200 ft with least assistive device and Supervision with no breaks to allow for increased mobility within home.    Baseline 7/14: 190 ft with RW 4/21: ambulate 150 ft 6/21: had to terminate after 90 ft due to cardiac     Time 12    Period Weeks    Status Partially Met    Target Date 01/12/20      PT LONG TERM GOAL #7   Title Patient will perform 10 MWT in <30 seconds for improved gait speed, gait mechanics, and functional capacity for mobility.     Baseline  7/14: 37.98 seconds 6/21: 46.9 seconds with RW 07/28/19: 51 seconds with RW 06/02/19: 62.5 seconds with RW 11/4: 56 seconds with AFO and RW 12/30: 53 seconds with AFO and RW     Time 12    Period Weeks    Status Partially Met    Target Date 01/12/20      PT LONG TERM GOAL #8   Title Patient will increase Berg Balance score by > 6 points (38/56) to demonstrate decreased fall risk during functional activities.    Baseline 7/14: 33/56 07/28/19: 32/56 6/21: 32/56     Time 12    Period Weeks    Status Partially Met    Target Date 01/12/20                 Plan - 11/03/19 1419    Clinical Impression Statement Patient presents with excellent motivation despite fatigue. Has increased need to use restroom, cues given and assisted for removing wheelchair footrests for carryover to home performance/independence. Patient will continue to benefit from skilled therapy in order to increase strength and endurance. will continue to benefit from skilled therapy in order to increase strength and endurance.    Rehab Potential Fair    Clinical Impairments Affecting Rehab Potential hx of HTN, HLD, CVA, learning disability, Diabetes, brain tumor,     PT Frequency 2x / week    PT Duration 12 weeks    PT Treatment/Interventions ADLs/Self Care Home Management;Aquatic  Therapy;Ultrasound;Moist Heat;Traction;DME Instruction;Gait training;Stair training;Functional mobility training;Therapeutic activities;Therapeutic exercise;Orthotic Fit/Training;Neuromuscular re-education;Balance training;Patient/family education;Manual techniques;Wheelchair mobility training;Passive range of motion;Energy conservation;Taping;Visual/perceptual remediation/compensation    PT Next Visit Plan progress standing balance    PT Home Exercise Plan TrA contraction, glute sets: needs updates. Potentially some time in supine flat in hospital bed, to improve hip extension deficits.     Consulted and Agree with Plan of Care Patient           Patient will benefit from skilled therapeutic intervention in order to improve the following deficits and impairments:  Abnormal gait, Decreased activity tolerance, Decreased balance, Decreased knowledge of precautions, Decreased endurance, Decreased coordination, Decreased knowledge of use of DME, Decreased mobility, Decreased range of motion, Difficulty walking, Decreased safety awareness, Decreased strength, Impaired flexibility, Impaired perceived functional ability, Impaired tone, Postural dysfunction, Improper body mechanics, Pain  Visit Diagnosis: Muscle weakness (generalized)  Other lack of coordination  Other abnormalities of gait and mobility  Hemiplegia and hemiparesis following cerebral infarction affecting right dominant side [facility])     Problem List Patient Active Problem List   Diagnosis Date Noted  . Acute CVA (cerebrovascular accident) ([place]) 05/02/2016  . Left-sided weakness 05/01/2016   Janna Arch, PT, DPT   11/03/2019, 5:00 PM  [place] MAIN [facility] SERVICES [place]  [place], [place], [address] Phone: [phone]   Fax:  [phone]  Name: Curtis Powell MRN: [id number] Date of Birth: Feb 03, 1966

## 2019-11-03 NOTE — Therapy (Addendum)
[place] MAIN [facility] SERVICES [address] [place], [place], [address] Phone: [phone]   Fax:  [phone]  Occupational Therapy Treatment  Patient Details  Name: Curtis Powell MRN: [id number] Date of Birth: October 09, 1965 No data recorded  Encounter Date: 11/03/2019   OT End of Session - 11/03/19 1322    Visit Number 202    Number of Visits 234    Date for OT Re-Evaluation 01/10/20    Authorization Type Progress reporting period starting 09/15/2019    OT Start Time 1300    OT Stop Time 1345    OT Time Calculation (min) 45 min    Activity Tolerance Patient tolerated treatment well    Behavior During Therapy [facility] for tasks assessed/performed           Past Medical History:  Diagnosis Date  . GERD (gastroesophageal reflux disease)   . Hyperlipidemia   . Hypertension   . Obesity   . Stroke [facility])     Past Surgical History:  Procedure Laterality Date  . BRAIN SURGERY      There were no vitals filed for this visit.   Subjective Assessment - 11/03/19 1320    Subjective  Pt. reports being excited about his therapy.    Patient is accompanied by: Family member    Pertinent History Pt. is a 54 y.o. male who suffered a CVA on 05/01/2016. Pt. was admitted to the hospital. Once discharged, he received Home Health PT and OT services for about a month. Pt. has had multiple CVAs over the past 8 years, and has had multiple falls in the past 6 months. Pt. resides in an apartment. Pt. has caregivers for 80 hours. Pt.'s mother stays with pt. at night, and assists with IADL tasks.      Currently in Pain? No/denies          OT TREATMENT    Therapeutic Exercise:  Pt. performed 3# dowel ex. for UE strengthening secondary to weakness. Bilateral shoulder flexion, chest press, circular patterns, and elbow flexion/extension were performed. 3# dumbbell ex. for elbow flexion and extension,  2# for forearm supination/pronation, wrist flexion/extension, and  radial deviation. Pt. requires rest breaks and verbal cues for proper technique.  Selfcare:  Pt. worked on independently removing bilateral leg rests for easier access to the bathroom. Pt. was able to complete it with verbal cues.    Pt. reports having difficulty removing bilateral w/c legrests on his new chair when using the bathroom.  Pt. required cues by the end of the session to access the w/c legrests. Pt. Also requires cues for task initiation. Pt. continues to work on improving BUE strength. Pt. continues to work on improving overall UE strength, and coordination skills in order to improve, and maximize independence with ADLs, and IADL tasks.                          OT Education - 11/03/19 1321    Education provided Yes    Education Details w/c leg rest removal    Person(s) Educated Patient    Methods Explanation;Demonstration    Comprehension Verbalized understanding;Returned demonstration;Verbal cues required            OT Short Term Goals - 03/03/17 1459      OT SHORT TERM GOAL #1   Title `             OT Long Term Goals - 10/27/19 1314  OT LONG TERM GOAL #4   Title Pt. will perform self dressing with minA and A/E as needed.    Baseline Pt. continues to be able to donn a T shirt independently with increased time. Min-modA donning a jacket, Mod A donning pants over his feet, and socks, minA with shoes.    Time 12    Period Weeks    Status Partially Met    Target Date 01/10/20      OT LONG TERM GOAL #6   Title Pt. will write his name efficiently with 100% legibility    Baseline Pt. continues to present with 75% legibility with increased time required when printing his name, and 50% legibility when cursive writing his name to sign in when riding the [place]    Time 12    Period Weeks    Status On-going    Target Date 01/10/20      OT LONG TERM GOAL #7   Title Pt will independently and consistently follow HEP to increase UE strength to  increase functional independence    Baseline Pt. continues to work towards independence with exercises    Time 12    Period Weeks    Status On-going    Target Date 01/10/20      OT LONG TERM GOAL #8   Title Pt. will require supervision ironing a shirt.    Baseline Pt. has progressed CGA, and complete set-up seated using a tabletop iron    Time 12    Period Weeks    Status On-going    Target Date 01/10/20      OT LONG TERM GOAL  #9   Baseline Pt. will independently be able to sew a button onto a shirt.    Time 12    Period Weeks    Status On-going    Target Date 01/10/20      OT LONG TERM GOAL  #10   TITLE Pt. will independently be able to throw a ball    Baseline Pt. continues to be limited with throwing overhand, however pt. is able to perform underhand throwing.    Time 12    Period Weeks    Status On-going    Target Date 01/10/20      OT LONG TERM GOAL  #11   TITLE Pt. will improve UE functional reaching to be able to independently use his RUE to hand his clothes in the closest.    Baseline Pt. continues to improve with reaching up with the RUE, however has difficulty reaching the hanger rack in the closet.    Time 12    Period Weeks    Status On-going    Target Date 01/10/20      OT LONG TERM GOAL  #13   TITLE Pt. will increase BUE strength to be able to prepare for assisting with yardwork.    Baseline Pt. BUE strength 4-/5. Pt. reports that he has been able to get out to the front.    Time 12    Period Weeks    Status On-going                 Plan - 11/03/19 1323    Clinical Impression Statement Pt. reports having difficulty removing bilateral w/c legrests on his new chair when using the bathroom.  Pt. required cues by the end of the session to access the w/c legrests. Pt. Also requires cues for task initiation. Pt. continues to work on improving BUE strength. Pt. continues  to work on improving overall UE strength, and coordination skills in order to  improve, and maximize independence with ADLs, and IADL tasks.   OT Occupational Profile and History Problem Focused Assessment - Including review of records relating to presenting problem    Occupational performance deficits (Please refer to evaluation for details): ADL's;IADL's    Body Structure / Function / Physical Skills ADL;FMC;ROM;IADL;Pain;Coordination    Rehab Potential Fair    Clinical Decision Making Several treatment options, min-mod task modification necessary    Comorbidities Affecting Occupational Performance: May have comorbidities impacting occupational performance    Modification or Assistance to Complete Evaluation  Min-Moderate modification of tasks or assist with assess necessary to complete eval    OT Frequency 2x / week    OT Duration 12 weeks    OT Treatment/Interventions Self-care/ADL training;Patient/family education;DME and/or AE instruction;Therapeutic exercise;Neuromuscular education    Consulted and Agree with Plan of Care Patient           Patient will benefit from skilled therapeutic intervention in order to improve the following deficits and impairments:   Body Structure / Function / Physical Skills: ADL, FMC, ROM, IADL, Pain, Coordination       Visit Diagnosis: Muscle weakness (generalized)  Other lack of coordination    Problem List Patient Active Problem List   Diagnosis Date Noted  . Acute CVA (cerebrovascular accident) ([place]) 05/02/2016  . Left-sided weakness 05/01/2016    Harrel Carina, MS, OTR/L 11/03/2019, 1:28 PM  [place] MAIN [facility] SERVICES [address] [place], [place], [address] Phone: [phone]   Fax:  [phone]  Name: MIKLE STERNBERG MRN: [id number] Date of Birth: June 30, 1965

## 2019-11-08 ENCOUNTER — Ambulatory Visit: Payer: Medicare HMO | Admitting: Occupational Therapy

## 2019-11-08 ENCOUNTER — Encounter: Payer: Self-pay | Admitting: Occupational Therapy

## 2019-11-08 ENCOUNTER — Ambulatory Visit: Payer: Medicare HMO | Attending: Family Medicine

## 2019-11-08 ENCOUNTER — Other Ambulatory Visit: Payer: Self-pay

## 2019-11-08 DIAGNOSIS — R2689 Other abnormalities of gait and mobility: Secondary | ICD-10-CM | POA: Diagnosis present

## 2019-11-08 DIAGNOSIS — I69351 Hemiplegia and hemiparesis following cerebral infarction affecting right dominant side: Secondary | ICD-10-CM | POA: Diagnosis present

## 2019-11-08 DIAGNOSIS — R278 Other lack of coordination: Secondary | ICD-10-CM | POA: Insufficient documentation

## 2019-11-08 DIAGNOSIS — M6281 Muscle weakness (generalized): Secondary | ICD-10-CM

## 2019-11-08 DIAGNOSIS — I639 Cerebral infarction, unspecified: Secondary | ICD-10-CM | POA: Insufficient documentation

## 2019-11-08 DIAGNOSIS — R531 Weakness: Secondary | ICD-10-CM | POA: Insufficient documentation

## 2019-11-08 NOTE — Therapy (Signed)
[place] MAIN [facility] SERVICES [address] [place], [place], [address] Phone: [phone]   Fax:  [phone]  Occupational Therapy Treatment  Patient Details  Name: Curtis Powell MRN: [id number] Date of Birth: [birth year] No data recorded  Encounter Date: 11/08/2019   OT End of Session - 11/09/19 2137    Visit Number [id number]    Number of Visits 234    Date for OT Re-Evaluation 01/10/20    Authorization Type Progress reporting period starting 09/15/2019    OT Start Time 1447    OT Stop Time 1530    OT Time Calculation (min) 43 min    Activity Tolerance Patient tolerated treatment well    Behavior During Therapy [facility] for tasks assessed/performed           Past Medical History:  Diagnosis Date  . GERD (gastroesophageal reflux disease)   . Hyperlipidemia   . Hypertension   . Obesity   . Stroke [facility])     Past Surgical History:  Procedure Laterality Date  . BRAIN SURGERY      There were no vitals filed for this visit.   Subjective Assessment - 11/09/19 2137    Subjective  Patient reports he would like to work on strengthening today.    Pertinent History Pt. is a 54 y.o. male who suffered a CVA on 05/01/2016. Pt. was admitted to the hospital. Once discharged, he received Home Health PT and OT services for about a month. Pt. has had multiple CVAs over the past 8 years, and has had multiple falls in the past 6 months. Pt. resides in an apartment. Pt. has caregivers for 80 hours. Pt.'s mother stays with pt. at night, and assists with IADL tasks.      Patient Stated Goals To be able to throw a ball and dribble a ball, do as much as I can for myself.     Currently in Pain? No/denies    Pain Score 0-No pain            3# dowel exercises for strengthening, shoulder flexion, ABD/ADD, chest press, forwards and backwards circles and diagonal patterns with dowel, 2 sets of 15 repetitions each.  Therapist demo and cues for proper form and  technique.   2# dumbbell  Overhead press, punch forwards, elbow flexion/ext, supination/pronation, wrist flex/ext.  2 sets of 15 reps. Cues as needed.  Difficulty with triceps press and over head press on right  ADL:  toileting skills patient seen for toilet transfer with set up and supervision.  Patient requires assistance at times to access his backpack to obtain his wipes.  Able to complete clothing negotiation with supervision.  Hand hygiene from seated position.          Response to tx:   Difficulty with 2# dumbbell weight on the right with full overhead extension versus left.  Patient often requires therapist demonstration and cues with exercises.  Limited ROM on the right versus left as well as decreased strength on right UE.  Patient required setup and overall supervision for toileting this date, use of grab bars for safety.   Continue to work towards goals to increase safety and independence in daily tasks.            OT Education - 11/09/19 2137    Education provided Yes    Education Details strengthening exercises    Person(s) Educated Patient    Methods Explanation;Demonstration    Comprehension Verbalized understanding;Returned demonstration;Verbal cues required  OT Short Term Goals - 03/03/17 1459      OT SHORT TERM GOAL #1   Title `             OT Long Term Goals - 10/27/19 1314      OT LONG TERM GOAL #4   Title Pt. will perform self dressing with minA and A/E as needed.    Baseline Pt. continues to be able to donn a T shirt independently with increased time. Min-modA donning a jacket, Mod A donning pants over his feet, and socks, minA with shoes.    Time 12    Period Weeks    Status Partially Met    Target Date 01/10/20      OT LONG TERM GOAL #6   Title Pt. will write his name efficiently with 100% legibility    Baseline Pt. continues to present with 75% legibility with increased time required when printing his name, and 50% legibility  when cursive writing his name to sign in when riding the [place]    Time 12    Period Weeks    Status On-going    Target Date 01/10/20      OT LONG TERM GOAL #7   Title Pt will independently and consistently follow HEP to increase UE strength to increase functional independence    Baseline Pt. continues to work towards independence with exercises    Time 12    Period Weeks    Status On-going    Target Date 01/10/20      OT LONG TERM GOAL #8   Title Pt. will require supervision ironing a shirt.    Baseline Pt. has progressed CGA, and complete set-up seated using a tabletop iron    Time 12    Period Weeks    Status On-going    Target Date 01/10/20      OT LONG TERM GOAL  #9   Baseline Pt. will independently be able to sew a button onto a shirt.    Time 12    Period Weeks    Status On-going    Target Date 01/10/20      OT LONG TERM GOAL  #10   TITLE Pt. will independently be able to throw a ball    Baseline Pt. continues to be limited with throwing overhand, however pt. is able to perform underhand throwing.    Time 12    Period Weeks    Status On-going    Target Date 01/10/20      OT LONG TERM GOAL  #11   TITLE Pt. will improve UE functional reaching to be able to independently use his RUE to hand his clothes in the closest.    Baseline Pt. continues to improve with reaching up with the RUE, however has difficulty reaching the hanger rack in the closet.    Time 12    Period Weeks    Status On-going    Target Date 01/10/20      OT LONG TERM GOAL  #13   TITLE Pt. will increase BUE strength to be able to prepare for assisting with yardwork.    Baseline Pt. BUE strength 4-/5. Pt. reports that he has been able to get out to the front.    Time 12    Period Weeks    Status On-going                 Plan - 11/09/19 2138    Clinical Impression Statement Difficulty with 2#  dumbbell weight on the right with full overhead extension versus left.  Patient often requires  therapist demonstration and cues with exercises.  Limited ROM on the right versus left as well as decreased strength on right UE.  Patient required setup and overall supervision for toileting this date, use of grab bars for safety.  Continue to work towards goals to increase safety and independence in daily tasks.    OT Occupational Profile and History Problem Focused Assessment - Including review of records relating to presenting problem    Occupational performance deficits (Please refer to evaluation for details): ADL's;IADL's    Body Structure / Function / Physical Skills ADL;FMC;ROM;IADL;Pain;Coordination    Rehab Potential Fair    Clinical Decision Making Several treatment options, min-mod task modification necessary    Comorbidities Affecting Occupational Performance: May have comorbidities impacting occupational performance    Modification or Assistance to Complete Evaluation  Min-Moderate modification of tasks or assist with assess necessary to complete eval    OT Frequency 1x / week    OT Duration 12 weeks    OT Treatment/Interventions Self-care/ADL training;Patient/family education;DME and/or AE instruction;Therapeutic exercise;Neuromuscular education    Consulted and Agree with Plan of Care Patient           Patient will benefit from skilled therapeutic intervention in order to improve the following deficits and impairments:   Body Structure / Function / Physical Skills: ADL, FMC, ROM, IADL, Pain, Coordination       Visit Diagnosis: Muscle weakness (generalized)  Other lack of coordination  Hemiplegia and hemiparesis following cerebral infarction affecting right dominant side [facility])    Problem List Patient Active Problem List   Diagnosis Date Noted  . Acute CVA (cerebrovascular accident) ([place]) 05/02/2016  . Left-sided weakness 05/01/2016   Zubair Lofton T [name], OTR/L, CLT  Eathel Pajak 11/10/2019, 3:18 PM  [place] MAIN [facility]  SERVICES [address] [place], [place], [address] Phone: [phone]   Fax:  [phone]  Name: Curtis Powell MRN: [id number] Date of Birth: Jan 26, 1966

## 2019-11-08 NOTE — Therapy (Signed)
[place] MAIN [facility] SERVICES [address] [place], [place], [address] Phone: [phone]   Fax:  [phone]  Physical Therapy Treatment Physical Therapy Progress Note   Dates of reporting period  09/27/19   to   11/08/19  Patient Details  Name: Curtis Powell MRN: [id number] Date of Birth: 08-21-1965 No data recorded  Encounter Date: 11/08/2019   PT End of Session - 11/08/19 1352    Visit Number [id number]    Number of Visits 229    Date for PT Re-Evaluation 01/12/20    Authorization Type 10/10 PN 09/27/19; next session 1/10 PN 11/08/19    Authorization Time Period authorized 11/16/18-05/19/19    PT Start Time 1345    PT Stop Time 1429    PT Time Calculation (min) 44 min    Equipment Utilized During Treatment Gait belt    Activity Tolerance Patient tolerated treatment well;No increased pain    Behavior During Therapy WFL for tasks assessed/performed           Past Medical History:  Diagnosis Date  . GERD (gastroesophageal reflux disease)   . Hyperlipidemia   . Hypertension   . Obesity   . Stroke [facility])     Past Surgical History:  Procedure Laterality Date  . BRAIN SURGERY      There were no vitals filed for this visit.   Subjective Assessment - 11/08/19 1353    Subjective Patient reports he wasnt able to practice his walking too much over the weekend with his mom but did all his seated exercises. No falls or LOB since last session. Still learning how to get leg rests unlock/locked.    Pertinent History  Patient is a pleasant 54 year old male who presents to physical therapy for weakness and immobility secondary to CVA.  Had a stroke in Feb 2018. He was previously fully independent, but this stroke caused severe residual deficits, mainly on the right side as well as speech, and now he is unable to walk or perform most of his ADLs on his own. Entire right side is very weak. He still has a little difficulty with speech but his swallowing is  improved to baseline. His mom had to move in with him and now is his main caregiver.     Limitations Sitting;Lifting;Standing;Walking;House hold activities    How long can you sit comfortably? 5 minutes    How long can you stand comfortably? 5 minutes    How long can you walk comfortably? 10 ft    Patient Stated Goals Walk without walker, walk further with walker, get some strength in back     Currently in Pain? No/denies                   Goals performed 5 sessions ago on 10/20/19   Ambulating with RW:     ambulate 168 ft x CGA with RW, cues for foot clearance, upright posture, tactile cueing to PSIS and ASIS for body mechanics and sequencing; Able to turn with good mechanics keeping feet inside walker, one standing rest break -resting after 169 ft due to Hr elevation. Able to reduce to 98 after sitting 2 minutes    Static stand w RW in front for stability: close CGA  Stand with shadow box, occasional Min A to return to [facility], able to perform twenty reciprocating punches with one episode of LOB  Stand with UE support to stand position, let go of support, PVC pipe chest press  10x, two episodes of near LOB.   Stand small step forward/backwards 5x each LE, very challenging LLE, due to limited tolerance of standing w/o UE support with RLE.   Mini knee squats with UE support on RW 10x ; wheelchair behind     education and performance of pushing foot rests away and together to stand up and sit down.       Pt educated throughout session about proper posture and technique with exercises. Improved exercise technique, movement at target joints, use of target muscles after min to mod verbal, visual, tactile cues     Patient's condition has the potential to improve in response to therapy. Maximum improvement is yet to be obtained. The anticipated improvement is attainable and reasonable in a generally predictable time.  Patient reports his walking is improving but his distance is still  limited. Would like to be able to stand and make a sandwich.                   PT Education - 11/08/19 1353    Education provided Yes    Education Details exercise technique, body mechanics    Person(s) Educated Patient    Methods Explanation;Demonstration;Tactile cues;Verbal cues    Comprehension Verbalized understanding;Returned demonstration;Verbal cues required;Tactile cues required            PT Short Term Goals - 10/20/19 1722      PT SHORT TERM GOAL #1   Title Patient will be independent in home exercise program to improve strength/mobility for better functional independence with ADLs.    Baseline hep compliant    Time 2    Period Weeks    Status Achieved      PT SHORT TERM GOAL #2   Title Patient will require min cueing for STS transfer with CGA for increased independence with mobility.     Baseline CGA    Time 2    Period Weeks    Status Achieved      PT SHORT TERM GOAL #3   Title Patient will maintain upright posture for > 15 seconds to demonstrate strengthened postural control muscualture     Baseline 18 seconds     Time 2    Period Weeks    Status Achieved             PT Long Term Goals - 10/20/19 0001      PT LONG TERM GOAL #1   Title Patient will perform bed mobility (rolling and supine <>sit) with Mod I  to increase mobility and  perform ADLs.     Baseline 7/14: Min A 4/21: unable to perform 6/15: improving but still min-mod A     Time 12    Period Weeks    Status Partially Met    Target Date 01/12/20      PT LONG TERM GOAL #2   Title Patient (< 55 years old) will complete five times sit to stand test in < 10 seconds indicating an increased LE strength and improved balance.    Baseline  7/14: 14.31 seconds 6/21: 16.86 seconds 4/21: 18.8 seconds 06/02/19: 19.93 seconds one hand on RW, one hand on w/c 12/30: 16 secpmds BUE support 02/11/19: 36 seconds 5/19: 20.6s from [place], one hand on walker and one on WC, 80% upright posture 7/1: 22  seconds one hand on walker one hand on w/c  9/14: 48 seconds with full upright posture    Time 12    Period Weeks  Status Partially Met    Target Date 01/12/20      PT LONG TERM GOAL #3   Title Patient will negotiate a bathroom with a walker and Min A for increased indep in home and external environment    Baseline 7/14: unable to do     Time 12    Period Weeks    Status New    Target Date 01/12/20      PT LONG TERM GOAL #4   Title Patient will increase BLE gross strength to 4+/5 as to improve functional strength for independent gait, increased standing tolerance and increased ADL ability.    Baseline  4/21: see note 06/02/19: hip flexion 4-/5, extension/abd 3/5 12/30: hip flexion 4/5 hip extension and abduction 3/5 02/11/19: hip flexion 4/5 hip extension and abduction 2+/54-/5 RLE, 10/15: 4/5 RLE 11/7: 4/5 RLE 3/3: 4/5 7/1:  hip flexion 4/5 hip extension 2/5 hip abduction/adduciton 2/5     Period Weeks    Status Partially Met    Target Date 01/12/20      PT LONG TERM GOAL #6   Title Patient will ambulate 200 ft with least assistive device and Supervision with no breaks to allow for increased mobility within home.    Baseline 7/14: 190 ft with RW 4/21: ambulate 150 ft 6/21: had to terminate after 90 ft due to cardiac     Time 12    Period Weeks    Status Partially Met    Target Date 01/12/20      PT LONG TERM GOAL #7   Title Patient will perform 10 MWT in <30 seconds for improved gait speed, gait mechanics, and functional capacity for mobility.     Baseline  7/14: 37.98 seconds 6/21: 46.9 seconds with RW 07/28/19: 51 seconds with RW 06/02/19: 62.5 seconds with RW 11/4: 56 seconds with AFO and RW 12/30: 53 seconds with AFO and RW     Time 12    Period Weeks    Status Partially Met    Target Date 01/12/20      PT LONG TERM GOAL #8   Title Patient will increase Berg Balance score by > 6 points (38/56) to demonstrate decreased fall risk during functional activities.    Baseline 7/14:  33/56 07/28/19: 32/56 6/21: 32/56     Time 12    Period Weeks    Status Partially Met    Target Date 01/12/20                 Plan - 11/08/19 1531    Clinical Impression Statement Patient's goals recently performed ~ 5 sessions ago on 10/20/19, please refer to that note for further details on goal progression. Gait mechanics and capacity for functional gait is improving with increased distance negotiated as well as improved foot clearance and upright posture. Increased stability in standing with decreased UE support performed and tolerated well. Patient's condition has the potential to improve in response to therapy. Maximum improvement is yet to be obtained. The anticipated improvement is attainable and reasonable in a generally predictable time.Patient will continue to benefit from skilled therapy in order to increase strength and endurance. will continue to benefit from skilled therapy in order to increase strength and endurance.    Rehab Potential Fair    Clinical Impairments Affecting Rehab Potential hx of HTN, HLD, CVA, learning disability, Diabetes, brain tumor,     PT Frequency 2x / week    PT Duration 12 weeks    PT Treatment/Interventions  ADLs/Self Care Home Management;Aquatic Therapy;Ultrasound;Moist Heat;Traction;DME Instruction;Gait training;Stair training;Functional mobility training;Therapeutic activities;Therapeutic exercise;Orthotic Fit/Training;Neuromuscular re-education;Balance training;Patient/family education;Manual techniques;Wheelchair mobility training;Passive range of motion;Energy conservation;Taping;Visual/perceptual remediation/compensation    PT Next Visit Plan progress standing balance    PT Home Exercise Plan TrA contraction, glute sets: needs updates. Potentially some time in supine flat in hospital bed, to improve hip extension deficits.     Consulted and Agree with Plan of Care Patient           Patient will benefit from skilled therapeutic intervention  in order to improve the following deficits and impairments:  Abnormal gait, Decreased activity tolerance, Decreased balance, Decreased knowledge of precautions, Decreased endurance, Decreased coordination, Decreased knowledge of use of DME, Decreased mobility, Decreased range of motion, Difficulty walking, Decreased safety awareness, Decreased strength, Impaired flexibility, Impaired perceived functional ability, Impaired tone, Postural dysfunction, Improper body mechanics, Pain  Visit Diagnosis: Muscle weakness (generalized)  Other lack of coordination  Other abnormalities of gait and mobility  Hemiplegia and hemiparesis following cerebral infarction affecting right dominant side [facility])     Problem List Patient Active Problem List   Diagnosis Date Noted  . Acute CVA (cerebrovascular accident) ([place]) 05/02/2016  . Left-sided weakness 05/01/2016   Janna Arch, PT, DPT   11/08/2019, 3:32 PM  [place] MAIN [facility] SERVICES [address] [place], [place], [address] Phone: [phone]   Fax:  [phone]  Name: KEIJI MELLAND MRN: [id number] Date of Birth: 28-Feb-1966

## 2019-11-10 ENCOUNTER — Ambulatory Visit: Payer: Medicare HMO

## 2019-11-10 ENCOUNTER — Other Ambulatory Visit: Payer: Self-pay

## 2019-11-10 ENCOUNTER — Encounter: Payer: Self-pay | Admitting: Occupational Therapy

## 2019-11-10 ENCOUNTER — Ambulatory Visit: Payer: Medicare HMO | Admitting: Occupational Therapy

## 2019-11-10 DIAGNOSIS — I69351 Hemiplegia and hemiparesis following cerebral infarction affecting right dominant side: Secondary | ICD-10-CM

## 2019-11-10 DIAGNOSIS — M6281 Muscle weakness (generalized): Secondary | ICD-10-CM

## 2019-11-10 DIAGNOSIS — R2689 Other abnormalities of gait and mobility: Secondary | ICD-10-CM

## 2019-11-10 DIAGNOSIS — R278 Other lack of coordination: Secondary | ICD-10-CM

## 2019-11-10 NOTE — Therapy (Signed)
[place] MAIN [facility] SERVICES [address] [place], [place], [address] Phone: [phone]   Fax:  [phone]  Occupational Therapy Treatment  Patient Details  Name: Curtis Powell MRN: [id number] Date of Birth: 11-02-1965 No data recorded  Encounter Date: 11/10/2019   OT End of Session - 11/10/19 1520    Visit Number [id number]    Number of Visits 234    Date for OT Re-Evaluation 01/10/20    Authorization Type Progress reporting period starting 09/15/2019    OT Start Time 1500    OT Stop Time 1545    OT Time Calculation (min) 45 min    Activity Tolerance Patient tolerated treatment well    Behavior During Therapy [facility] for tasks assessed/performed           Past Medical History:  Diagnosis Date  . GERD (gastroesophageal reflux disease)   . Hyperlipidemia   . Hypertension   . Obesity   . Stroke [facility])     Past Surgical History:  Procedure Laterality Date  . BRAIN SURGERY      There were no vitals filed for this visit.   Subjective Assessment - 11/10/19 1519    Subjective  Patient reports he enjoyed working on strengthening last time and wants to try to get his right arm stronger.    Pertinent History Pt. is a 54 y.o. male who suffered a CVA on 05/01/2016. Pt. was admitted to the hospital. Once discharged, he received Home Health PT and OT services for about a month. Pt. has had multiple CVAs over the past 8 years, and has had multiple falls in the past 6 months. Pt. resides in an apartment. Pt. has caregivers for 80 hours. Pt.'s mother stays with pt. at night, and assists with IADL tasks.      Patient Stated Goals To be able to throw a ball and dribble a ball, do as much as I can for myself.     Currently in Pain? No/denies    Pain Score 0-No pain            Patient reports his weakness was initially on the left side but now his right side is his weaker hand.  Patient has a new wheelchair about a month ago.   Patient seen for ROM and  strengthening with use of SAEBO looped ball tower, placing looped balls with right hand onto 3 levels of reach.  3rd level a challenge and demonstrates difficulty.  Left hand patient able to complete 3 levels with greater ease.  With 2# weight patient is able to achieve 2 bottom levels with right hand and all 3 levels with left.  Rest breaks as needed with right, none needed with left side.     Resistive pinch pins with right hand to pick up and place onto stick (pt holding stick with left hand), able to complete all levels of resistance from yellow to black.  Switched and performed place and removal with left hand while stabilizing stick with right.  Able to complete all levels with left.   Response to tx:   Patient demonstrates decreased ROM and strength on right versus left when performing exercises.  Rest breaks as needed with right UE tasks.  Patient continues to progress with strength and coordination skills, right worse than left.  Continue OT to maximize safety and independence in necessary daily tasks.  OT Education - 11/10/19 1520    Education provided Yes    Education Details reaching, weights    Person(s) Educated Patient    Methods Explanation;Demonstration    Comprehension Verbalized understanding;Returned demonstration;Verbal cues required            OT Short Term Goals - 03/03/17 1459      OT SHORT TERM GOAL #1   Title `             OT Long Term Goals - 10/27/19 1314      OT LONG TERM GOAL #4   Title Pt. will perform self dressing with minA and A/E as needed.    Baseline Pt. continues to be able to donn a T shirt independently with increased time. Min-modA donning a jacket, Mod A donning pants over his feet, and socks, minA with shoes.    Time 12    Period Weeks    Status Partially Met    Target Date 01/10/20      OT LONG TERM GOAL #6   Title Pt. will write his name efficiently with 100% legibility    Baseline Pt. continues to  present with 75% legibility with increased time required when printing his name, and 50% legibility when cursive writing his name to sign in when riding the [place]    Time 12    Period Weeks    Status On-going    Target Date 01/10/20      OT LONG TERM GOAL #7   Title Pt will independently and consistently follow HEP to increase UE strength to increase functional independence    Baseline Pt. continues to work towards independence with exercises    Time 12    Period Weeks    Status On-going    Target Date 01/10/20      OT LONG TERM GOAL #8   Title Pt. will require supervision ironing a shirt.    Baseline Pt. has progressed CGA, and complete set-up seated using a tabletop iron    Time 12    Period Weeks    Status On-going    Target Date 01/10/20      OT LONG TERM GOAL  #9   Baseline Pt. will independently be able to sew a button onto a shirt.    Time 12    Period Weeks    Status On-going    Target Date 01/10/20      OT LONG TERM GOAL  #10   TITLE Pt. will independently be able to throw a ball    Baseline Pt. continues to be limited with throwing overhand, however pt. is able to perform underhand throwing.    Time 12    Period Weeks    Status On-going    Target Date 01/10/20      OT LONG TERM GOAL  #11   TITLE Pt. will improve UE functional reaching to be able to independently use his RUE to hand his clothes in the closest.    Baseline Pt. continues to improve with reaching up with the RUE, however has difficulty reaching the hanger rack in the closet.    Time 12    Period Weeks    Status On-going    Target Date 01/10/20      OT LONG TERM GOAL  #13   TITLE Pt. will increase BUE strength to be able to prepare for assisting with yardwork.    Baseline Pt. BUE strength 4-/5. Pt. reports that he has been able to  get out to the front.    Time 12    Period Weeks    Status On-going                 Plan - 11/10/19 1521    Clinical Impression Statement Patient  demonstrates decreased ROM and strength on right versus left when performing exercises.  Rest breaks as needed with right UE tasks.  Patient continues to progress with strength and coordination skills, right worse than left.  Continue OT to maximize safety and independence in necessary daily tasks.    OT Occupational Profile and History Problem Focused Assessment - Including review of records relating to presenting problem    Occupational performance deficits (Please refer to evaluation for details): ADL's;IADL's    Body Structure / Function / Physical Skills ADL;FMC;ROM;IADL;Pain;Coordination    Rehab Potential Fair    Clinical Decision Making Several treatment options, min-mod task modification necessary    Comorbidities Affecting Occupational Performance: May have comorbidities impacting occupational performance    Modification or Assistance to Complete Evaluation  Min-Moderate modification of tasks or assist with assess necessary to complete eval    OT Frequency 1x / week    OT Duration 12 weeks    OT Treatment/Interventions Self-care/ADL training;Patient/family education;DME and/or AE instruction;Therapeutic exercise;Neuromuscular education    Consulted and Agree with Plan of Care Patient           Patient will benefit from skilled therapeutic intervention in order to improve the following deficits and impairments:   Body Structure / Function / Physical Skills: ADL, FMC, ROM, IADL, Pain, Coordination       Visit Diagnosis: Muscle weakness (generalized)  Other lack of coordination  Hemiplegia and hemiparesis following cerebral infarction affecting right dominant side [facility])    Problem List Patient Active Problem List   Diagnosis Date Noted  . Acute CVA (cerebrovascular accident) ([place]) 05/02/2016  . Left-sided weakness 05/01/2016   Diago Haik T [name], OTR/L, CLT  Gerry Blanchfield 11/11/2019, 9:27 PM  [place] MAIN [facility] SERVICES [address]  [place], [place], [address] Phone: [phone]   Fax:  [phone]  Name: SHAWNTEZ DICKISON MRN: [id number] Date of Birth: Sep 15, 1965

## 2019-11-10 NOTE — Therapy (Signed)
[place] MAIN [facility] SERVICES [address] [place], [place], [address] Phone: [phone]   Fax:  [phone]  Physical Therapy Treatment  Patient Details  Name: Curtis Powell MRN: [id number] Date of Birth: [birth year] No data recorded  Encounter Date: 11/10/2019   PT End of Session - 11/10/19 1501    Visit Number [id number]    Number of Visits 229    Date for PT Re-Evaluation 01/12/20    Authorization Type 1/10 PN 11/08/19    Authorization Time Period authorized 11/16/18-05/19/19    PT Start Time 8875    PT Stop Time 1446    PT Time Calculation (min) 47 min    Equipment Utilized During Treatment Gait belt    Activity Tolerance Patient tolerated treatment well;No increased pain    Behavior During Therapy WFL for tasks assessed/performed           Past Medical History:  Diagnosis Date  . GERD (gastroesophageal reflux disease)   . Hyperlipidemia   . Hypertension   . Obesity   . Stroke [facility])     Past Surgical History:  Procedure Laterality Date  . BRAIN SURGERY      There were no vitals filed for this visit.   Subjective Assessment - 11/10/19 1500    Subjective Patient reports having a good day. Can't practice walking outside at home due to aid not taking him outside.    Pertinent History  Patient is a pleasant 55 year old male who presents to physical therapy for weakness and immobility secondary to CVA.  Had a stroke in Feb 2018. He was previously fully independent, but this stroke caused severe residual deficits, mainly on the right side as well as speech, and now he is unable to walk or perform most of his ADLs on his own. Entire right side is very weak. He still has a little difficulty with speech but his swallowing is improved to baseline. His mom had to move in with him and now is his main caregiver.     Limitations Sitting;Lifting;Standing;Walking;House hold activities    How long can you sit comfortably? 5 minutes    How long can you  stand comfortably? 5 minutes    How long can you walk comfortably? 10 ft    Patient Stated Goals Walk without walker, walk further with walker, get some strength in back     Currently in Pain? No/denies                  Ambulating with RW:  ambulate across stable and unstable surface outside. Negotiating changing surfaces across sidewalks, across brick with turns and obstacles in pathway without LOB. CGA with RW, cues for foot clearance, upright posture, tactile cueing to PSIS and ASIS for body mechanics and sequencing; Able to turn with good mechanics keeping feet inside walker,     ambulate outside with education and performance of decline negotiation with RW and wheelchair follow. Cues for core activation and upright posture  Sit to stand from standard height bench in [place] x 5, Stand pivot transfer x 2    Static stand w RW in front for stability: close CGA  Stand with shadow box, occasional Min A to return to COM, able to perform twenty reciprocating punches with one episode of LOB     Stand small step forward/backwards 5x each LE, very challenging LLE, due to limited tolerance of standing w/o UE support with RLE.    Mini knee squats  with UE support on RW 10x ; wheelchair behind     education and performance of pushing foot rests away and together to stand up and sit down.    Seated:  Seated boxing: cues for sequencing, body mechanics, and pace/rhythm for functional contraction and timing of muscle recruitment: -Cross body punches to mitts on PT hands with no back support for focused core stabilization with crossing midline 2x 60 seconds  -Cross body punch with secondary combination of elbow for full cross body rotation to PT mitt for trunk stability, coordination, and cardiovascular challenge x 60 seconds -Upper cut to mitts in PT hands for core activation without back support with perturbations 60 seconds     Pt educated throughout session about proper posture  and technique with exercises. Improved exercise technique, movement at target joints, use of target muscles after min to mod verbal, visual, tactile cues                    PT Education - 11/10/19 1501    Education provided Yes    Education Details exercise technique, body mechanics    Person(s) Educated Patient    Methods Explanation;Demonstration;Tactile cues;Verbal cues    Comprehension Verbalized understanding;Returned demonstration;Verbal cues required;Tactile cues required            PT Short Term Goals - 10/20/19 1722      PT SHORT TERM GOAL #1   Title Patient will be independent in home exercise program to improve strength/mobility for better functional independence with ADLs.    Baseline hep compliant    Time 2    Period Weeks    Status Achieved      PT SHORT TERM GOAL #2   Title Patient will require min cueing for STS transfer with CGA for increased independence with mobility.     Baseline CGA    Time 2    Period Weeks    Status Achieved      PT SHORT TERM GOAL #3   Title Patient will maintain upright posture for > 15 seconds to demonstrate strengthened postural control muscualture     Baseline 18 seconds     Time 2    Period Weeks    Status Achieved             PT Long Term Goals - 10/20/19 0001      PT LONG TERM GOAL #1   Title Patient will perform bed mobility (rolling and supine <>sit) with Mod I  to increase mobility and  perform ADLs.     Baseline 7/14: Min A 4/21: unable to perform 6/15: improving but still min-mod A     Time 12    Period Weeks    Status Partially Met    Target Date 01/12/20      PT LONG TERM GOAL #2   Title Patient (< 31 years old) will complete five times sit to stand test in < 10 seconds indicating an increased LE strength and improved balance.    Baseline  7/14: 14.31 seconds 6/21: 16.86 seconds 4/21: 18.8 seconds 06/02/19: 19.93 seconds one hand on RW, one hand on w/c 12/30: 16 secpmds BUE support 02/11/19: 36  seconds 5/19: 20.6s from [place], one hand on walker and one on WC, 80% upright posture 7/1: 22 seconds one hand on walker one hand on w/c  9/14: 48 seconds with full upright posture    Time 12    Period Weeks    Status Partially Met  Target Date 01/12/20      PT LONG TERM GOAL #3   Title Patient will negotiate a bathroom with a walker and Min A for increased indep in home and external environment    Baseline 7/14: unable to do     Time 12    Period Weeks    Status New    Target Date 01/12/20      PT LONG TERM GOAL #4   Title Patient will increase BLE gross strength to 4+/5 as to improve functional strength for independent gait, increased standing tolerance and increased ADL ability.    Baseline  4/21: see note 06/02/19: hip flexion 4-/5, extension/abd 3/5 12/30: hip flexion 4/5 hip extension and abduction 3/5 02/11/19: hip flexion 4/5 hip extension and abduction 2+/54-/5 RLE, 10/15: 4/5 RLE 11/7: 4/5 RLE 3/3: 4/5 7/1:  hip flexion 4/5 hip extension 2/5 hip abduction/adduciton 2/5     Period Weeks    Status Partially Met    Target Date 01/12/20      PT LONG TERM GOAL #6   Title Patient will ambulate 200 ft with least assistive device and Supervision with no breaks to allow for increased mobility within home.    Baseline 7/14: 190 ft with RW 4/21: ambulate 150 ft 6/21: had to terminate after 90 ft due to cardiac     Time 12    Period Weeks    Status Partially Met    Target Date 01/12/20      PT LONG TERM GOAL #7   Title Patient will perform 10 MWT in <30 seconds for improved gait speed, gait mechanics, and functional capacity for mobility.     Baseline  7/14: 37.98 seconds 6/21: 46.9 seconds with RW 07/28/19: 51 seconds with RW 06/02/19: 62.5 seconds with RW 11/4: 56 seconds with AFO and RW 12/30: 53 seconds with AFO and RW     Time 12    Period Weeks    Status Partially Met    Target Date 01/12/20      PT LONG TERM GOAL #8   Title Patient will increase Berg Balance score by > 6 points  (38/56) to demonstrate decreased fall risk during functional activities.    Baseline 7/14: 33/56 07/28/19: 32/56 6/21: 32/56     Time 12    Period Weeks    Status Partially Met    Target Date 01/12/20                 Plan - 11/10/19 1502    Clinical Impression Statement Patient presents with excellent motivation throughout physical therapy session. He is challenged with changing surfaces outside with increased fatigue with the more unstable surfaces. Declines are improving with decreased need for assistance and only need for cueing at this time. Patient will continue to benefit from skilled therapy in order to increase strength and endurance. will continue to benefit from skilled therapy in order to increase strength and endurance.    Rehab Potential Fair    Clinical Impairments Affecting Rehab Potential hx of HTN, HLD, CVA, learning disability, Diabetes, brain tumor,     PT Frequency 2x / week    PT Duration 12 weeks    PT Treatment/Interventions ADLs/Self Care Home Management;Aquatic Therapy;Ultrasound;Moist Heat;Traction;DME Instruction;Gait training;Stair training;Functional mobility training;Therapeutic activities;Therapeutic exercise;Orthotic Fit/Training;Neuromuscular re-education;Balance training;Patient/family education;Manual techniques;Wheelchair mobility training;Passive range of motion;Energy conservation;Taping;Visual/perceptual remediation/compensation    PT Next Visit Plan progress standing balance    PT Home Exercise Plan TrA contraction, glute sets: needs updates. Potentially some  time in supine flat in hospital bed, to improve hip extension deficits.     Consulted and Agree with Plan of Care Patient           Patient will benefit from skilled therapeutic intervention in order to improve the following deficits and impairments:  Abnormal gait, Decreased activity tolerance, Decreased balance, Decreased knowledge of precautions, Decreased endurance, Decreased  coordination, Decreased knowledge of use of DME, Decreased mobility, Decreased range of motion, Difficulty walking, Decreased safety awareness, Decreased strength, Impaired flexibility, Impaired perceived functional ability, Impaired tone, Postural dysfunction, Improper body mechanics, Pain  Visit Diagnosis: Muscle weakness (generalized)  Other lack of coordination  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 Acute CVA (cerebrovascular accident) ([place]) 05/02/2016  . Left-sided weakness 05/01/2016   Janna Arch, PT, DPT   11/10/2019, 3:03 PM  [place] MAIN [facility] SERVICES [address] [place], [place], [address] Phone: [phone]   Fax:  [phone]  Name: Curtis Powell MRN: [id number] Date of Birth: 1965-06-30

## 2019-11-15 ENCOUNTER — Other Ambulatory Visit: Payer: Self-pay

## 2019-11-15 ENCOUNTER — Encounter: Payer: Self-pay | Admitting: Occupational Therapy

## 2019-11-15 ENCOUNTER — Ambulatory Visit: Payer: Medicare HMO | Admitting: Occupational Therapy

## 2019-11-15 ENCOUNTER — Ambulatory Visit: Payer: Medicare HMO

## 2019-11-15 DIAGNOSIS — R278 Other lack of coordination: Secondary | ICD-10-CM

## 2019-11-15 DIAGNOSIS — M6281 Muscle weakness (generalized): Secondary | ICD-10-CM | POA: Diagnosis not present

## 2019-11-15 DIAGNOSIS — R2689 Other abnormalities of gait and mobility: Secondary | ICD-10-CM

## 2019-11-15 DIAGNOSIS — I69351 Hemiplegia and hemiparesis following cerebral infarction affecting right dominant side: Secondary | ICD-10-CM

## 2019-11-15 NOTE — Therapy (Signed)
[place] MAIN [facility] SERVICES [address] [place], [place], [address] Phone: [phone]   Fax:  [phone]  Physical Therapy Treatment  Patient Details  Name: Curtis Powell MRN: [id number] Date of Birth: September 16, 1965 No data recorded  Encounter Date: 11/15/2019   PT End of Session - 11/15/19 1433    Visit Number [id number]    Number of Visits 229    Date for PT Re-Evaluation 01/12/20    Authorization Type 2/10 PN 11/08/19    Authorization Time Period authorized 11/16/18-05/19/19    PT Start Time 1345    PT Stop Time 1427    PT Time Calculation (min) 42 min    Equipment Utilized During Treatment Gait belt    Activity Tolerance Patient tolerated treatment well;No increased pain    Behavior During Therapy WFL for tasks assessed/performed           Past Medical History:  Diagnosis Date  . GERD (gastroesophageal reflux disease)   . Hyperlipidemia   . Hypertension   . Obesity   . Stroke [facility])     Past Surgical History:  Procedure Laterality Date  . BRAIN SURGERY      There were no vitals filed for this visit.   Subjective Assessment - 11/15/19 1349    Subjective Patient reports his mom may come wednesday to practice getting into/out of bed easier for his vacation. No falls or LOB, went to the doctor and was told his cholesterol is high.    Pertinent History  Patient is a pleasant 54 year old male who presents to physical therapy for weakness and immobility secondary to CVA.  Had a stroke in Feb 2018. He was previously fully independent, but this stroke caused severe residual deficits, mainly on the right side as well as speech, and now he is unable to walk or perform most of his ADLs on his own. Entire right side is very weak. He still has a little difficulty with speech but his swallowing is improved to baseline. His mom had to move in with him and now is his main caregiver.     Limitations Sitting;Lifting;Standing;Walking;House hold activities     How long can you sit comfortably? 5 minutes    How long can you stand comfortably? 5 minutes    How long can you walk comfortably? 10 ft    Patient Stated Goals Walk without walker, walk further with walker, get some strength in back     Currently in Pain? No/denies               vitals at start of session 114/63      Ambulating with RW:     ambulate 185 ft x CGA with RW, cues for foot clearance, upright posture, tactile cueing to PSIS and ASIS for body mechanics and sequencing; Able to turn with good mechanics keeping feet inside walker, one standing rest break        Static stand w RW in front for stability: close CGA   Stand with one hand on RW other hand reaching and transferring cones from one side of table to the other for carryover to daily task performance such as iADLs in the kitchen, lateral transfer x 5 each UE, forward transfer x5 each side   Stand with shadow box, occasional Min A to return to [facility], able to perform twenty reciprocating punches with one episode of LOB   Mini knee squats with UE support on RW 10x ; wheelchair behind  education and performance of pushing foot rests away and together to stand up and sit down.       Pt educated throughout session about proper posture and technique with exercises. Improved exercise technique, movement at target joints, use of target muscles after min to mod verbal, visual, tactile cues                     PT Education - 11/15/19 1349    Education provided Yes    Education Details exercise technique, body mechanics    Person(s) Educated Patient    Methods Explanation;Demonstration;Tactile cues;Verbal cues    Comprehension Verbalized understanding;Returned demonstration;Verbal cues required;Tactile cues required            PT Short Term Goals - 10/20/19 1722      PT SHORT TERM GOAL #1   Title Patient will be independent in home exercise program to improve strength/mobility for better  functional independence with ADLs.    Baseline hep compliant    Time 2    Period Weeks    Status Achieved      PT SHORT TERM GOAL #2   Title Patient will require min cueing for STS transfer with CGA for increased independence with mobility.     Baseline CGA    Time 2    Period Weeks    Status Achieved      PT SHORT TERM GOAL #3   Title Patient will maintain upright posture for > 15 seconds to demonstrate strengthened postural control muscualture     Baseline 18 seconds     Time 2    Period Weeks    Status Achieved             PT Long Term Goals - 10/20/19 0001      PT LONG TERM GOAL #1   Title Patient will perform bed mobility (rolling and supine <>sit) with Mod I  to increase mobility and  perform ADLs.     Baseline 7/14: Min A 4/21: unable to perform 6/15: improving but still min-mod A     Time 12    Period Weeks    Status Partially Met    Target Date 01/12/20      PT LONG TERM GOAL #2   Title Patient (< 65 years old) will complete five times sit to stand test in < 10 seconds indicating an increased LE strength and improved balance.    Baseline  7/14: 14.31 seconds 6/21: 16.86 seconds 4/21: 18.8 seconds 06/02/19: 19.93 seconds one hand on RW, one hand on w/c 12/30: 16 secpmds BUE support 02/11/19: 36 seconds 5/19: 20.6s from [facility], one hand on walker and one on WC, 80% upright posture 7/1: 22 seconds one hand on walker one hand on w/c  9/14: 48 seconds with full upright posture    Time 12    Period Weeks    Status Partially Met    Target Date 01/12/20      PT LONG TERM GOAL #3   Title Patient will negotiate a bathroom with a walker and Min A for increased indep in home and external environment    Baseline 7/14: unable to do     Time 12    Period Weeks    Status New    Target Date 01/12/20      PT LONG TERM GOAL #4   Title Patient will increase BLE gross strength to 4+/5 as to improve functional strength for independent gait, increased standing tolerance  and increased  ADL ability.    Baseline  4/21: see note 06/02/19: hip flexion 4-/5, extension/abd 3/5 12/30: hip flexion 4/5 hip extension and abduction 3/5 02/11/19: hip flexion 4/5 hip extension and abduction 2+/54-/5 RLE, 10/15: 4/5 RLE 11/7: 4/5 RLE 3/3: 4/5 7/1:  hip flexion 4/5 hip extension 2/5 hip abduction/adduciton 2/5     Period Weeks    Status Partially Met    Target Date 01/12/20      PT LONG TERM GOAL #6   Title Patient will ambulate 200 ft with least assistive device and Supervision with no breaks to allow for increased mobility within home.    Baseline 7/14: 190 ft with RW 4/21: ambulate 150 ft 6/21: had to terminate after 90 ft due to cardiac     Time 12    Period Weeks    Status Partially Met    Target Date 01/12/20      PT LONG TERM GOAL #7   Title Patient will perform 10 MWT in <30 seconds for improved gait speed, gait mechanics, and functional capacity for mobility.     Baseline  7/14: 37.98 seconds 6/21: 46.9 seconds with RW 07/28/19: 51 seconds with RW 06/02/19: 62.5 seconds with RW 11/4: 56 seconds with AFO and RW 12/30: 53 seconds with AFO and RW     Time 12    Period Weeks    Status Partially Met    Target Date 01/12/20      PT LONG TERM GOAL #8   Title Patient will increase Berg Balance score by > 6 points (38/56) to demonstrate decreased fall risk during functional activities.    Baseline 7/14: 33/56 07/28/19: 32/56 6/21: 32/56     Time 12    Period Weeks    Status Partially Met    Target Date 01/12/20                 Plan - 11/15/19 1434    Clinical Impression Statement Patient is progressing with functional stability with mobility with decreased UE support allowing for increased function in standing position. Increased capacity for functional mobility performed with patient tolerating progression of duration well. Patient will continue to benefit from skilled therapy in order to increase strength and endurance. will continue to benefit from skilled therapy in order  to increase strength and endurance.    Rehab Potential Fair    Clinical Impairments Affecting Rehab Potential hx of HTN, HLD, CVA, learning disability, Diabetes, brain tumor,     PT Frequency 2x / week    PT Duration 12 weeks    PT Treatment/Interventions ADLs/Self Care Home Management;Aquatic Therapy;Ultrasound;Moist Heat;Traction;DME Instruction;Gait training;Stair training;Functional mobility training;Therapeutic activities;Therapeutic exercise;Orthotic Fit/Training;Neuromuscular re-education;Balance training;Patient/family education;Manual techniques;Wheelchair mobility training;Passive range of motion;Energy conservation;Taping;Visual/perceptual remediation/compensation    PT Next Visit Plan progress standing balance    PT Home Exercise Plan TrA contraction, glute sets: needs updates. Potentially some time in supine flat in hospital bed, to improve hip extension deficits.     Consulted and Agree with Plan of Care Patient           Patient will benefit from skilled therapeutic intervention in order to improve the following deficits and impairments:  Abnormal gait, Decreased activity tolerance, Decreased balance, Decreased knowledge of precautions, Decreased endurance, Decreased coordination, Decreased knowledge of use of DME, Decreased mobility, Decreased range of motion, Difficulty walking, Decreased safety awareness, Decreased strength, Impaired flexibility, Impaired perceived functional ability, Impaired tone, Postural dysfunction, Improper body mechanics, Pain  Visit Diagnosis: Muscle weakness (  generalized)  Other lack of coordination  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 Acute CVA (cerebrovascular accident) ([place]) 05/02/2016  . Left-sided weakness 05/01/2016   Janna Arch, PT, DPT   11/15/2019, 2:36  PM  [place] MAIN [facility] SERVICES [address] [place], [place], [address] Phone: [phone]   Fax:  [phone]  Name: Curtis Powell MRN: [id number] Date of Birth: 27-Jul-1965

## 2019-11-15 NOTE — Therapy (Addendum)
[place] MAIN [facility] SERVICES [address] [place], [place], [address] Phone: [phone]   Fax:  [phone]  Occupational Therapy Treatment  Patient Details  Name: Curtis Powell MRN: [id number] Date of Birth: July 22, 1965 No data recorded  Encounter Date: 11/15/2019   OT End of Session - 11/15/19 1432    Visit Number [id number]    Number of Visits 234    Date for OT Re-Evaluation 01/10/20    Authorization Type Progress reporting period starting 09/15/2019    OT Start Time 1430    OT Stop Time 1515    OT Time Calculation (min) 45 min    Equipment Utilized During Treatment built up pen and adaptive scissors    Activity Tolerance Patient tolerated treatment well    Behavior During Therapy WFL for tasks assessed/performed           Past Medical History:  Diagnosis Date  . GERD (gastroesophageal reflux disease)   . Hyperlipidemia   . Hypertension   . Obesity   . Stroke [facility])     Past Surgical History:  Procedure Laterality Date  . BRAIN SURGERY      There were no vitals filed for this visit.   Subjective Assessment - 11/15/19 1430    Subjective  Patient reports that he wants to work on strengthening.    Patient is accompanied by: Family member    Pertinent History Pt. is a 54 y.o. male who suffered a CVA on 05/01/2016. Pt. was admitted to the hospital. Once discharged, he received Home Health PT and OT services for about a month. Pt. has had multiple CVAs over the past 8 years, and has had multiple falls in the past 6 months. Pt. resides in an apartment. Pt. has caregivers for 80 hours. Pt.'s mother stays with pt. at night, and assists with IADL tasks.      Patient Stated Goals To be able to throw a ball and dribble a ball, do as much as I can for myself.     Currently in Pain? No/denies          OT TREATMENT    Neuro muscular re-education:  Pt. performed [facility] tasks using the Grooved pegboard. Pt. worked on grasping the grooved pegs  from a horizontal position, and moving the pegs to a vertical position in the hand to prepare for placing them in the grooved slot. Pt. worked on storing pegs in the palm of his right hand, and translatory movements moving the objects from his palm to the tip of his 2nd digit and thumb.  Therapeutic Exercise:  Pt. performed 3# dowel ex. for UE strengthening secondary to weakness. Bilateral shoulder flexion, chest press, and circular patterns were performed. 3# dumbbell ex. for elbow flexion and extension, forearm supination/pronation, wrist flexion/extension, and radial deviation. Pt. requires rest breaks and verbal cues for proper technique. 1-2 sets 10-20 reps each.  Pt. reports that he is anticipating his trip to [place] at the end of August for a [employer]. Pt. reports that he is consistently doing more at home. Pt. reports that he is able to make sandwiches at home, after retrieving items from w/c height. Pt. Is improving with right hand strength, and [facility] skills. Pt. continues to present with limited BUE strength, and [facility] skills. Pt. continues to work on improving UE strength, and [facility] skills in order to work towards maximizing independence with ADLs, and IADLs.  OT Education - 11/15/19 1432    Education provided Yes    Education Details reaching, weights    Person(s) Educated Patient    Methods Explanation;Demonstration    Comprehension Verbalized understanding;Returned demonstration;Verbal cues required            OT Short Term Goals - 03/03/17 1459      OT SHORT TERM GOAL #1   Title `             OT Long Term Goals - 10/27/19 1314      OT LONG TERM GOAL #4   Title Pt. will perform self dressing with minA and A/E as needed.    Baseline Pt. continues to be able to donn a T shirt independently with increased time. Min-modA donning a jacket, Mod A donning pants over his feet, and socks, minA with shoes.    Time 12    Period  Weeks    Status Partially Met    Target Date 01/10/20      OT LONG TERM GOAL #6   Title Pt. will write his name efficiently with 100% legibility    Baseline Pt. continues to present with 75% legibility with increased time required when printing his name, and 50% legibility when cursive writing his name to sign in when riding the [place]    Time 12    Period Weeks    Status On-going    Target Date 01/10/20      OT LONG TERM GOAL #7   Title Pt will independently and consistently follow HEP to increase UE strength to increase functional independence    Baseline Pt. continues to work towards independence with exercises    Time 12    Period Weeks    Status On-going    Target Date 01/10/20      OT LONG TERM GOAL #8   Title Pt. will require supervision ironing a shirt.    Baseline Pt. has progressed CGA, and complete set-up seated using a tabletop iron    Time 12    Period Weeks    Status On-going    Target Date 01/10/20      OT LONG TERM GOAL  #9   Baseline Pt. will independently be able to sew a button onto a shirt.    Time 12    Period Weeks    Status On-going    Target Date 01/10/20      OT LONG TERM GOAL  #10   TITLE Pt. will independently be able to throw a ball    Baseline Pt. continues to be limited with throwing overhand, however pt. is able to perform underhand throwing.    Time 12    Period Weeks    Status On-going    Target Date 01/10/20      OT LONG TERM GOAL  #11   TITLE Pt. will improve UE functional reaching to be able to independently use his RUE to hand his clothes in the closest.    Baseline Pt. continues to improve with reaching up with the RUE, however has difficulty reaching the hanger rack in the closet.    Time 12    Period Weeks    Status On-going    Target Date 01/10/20      OT LONG TERM GOAL  #13   TITLE Pt. will increase BUE strength to be able to prepare for assisting with yardwork.    Baseline Pt. BUE strength 4-/5. Pt. reports that he has been  able  to get out to the front.    Time 12    Period Weeks    Status On-going                 Plan - 11/15/19 1432    Clinical Impression Statement Pt. reports that he is anticipating his trip to [place] at the end of August for a [employer]. Pt. reports that he is consistently doing more at home. Pt. reports that he is able to make sandwiches at home, after retrieving items from w/c height. Pt. Is improving with right hand strength, and [facility] skills. Pt. continues to present with limited BUE strength, and [facility] skills. Pt. continues to work on improving UE strength, and [facility] skills in order to work towards maximizing independence with ADLs, and IADLs.   OT Occupational Profile and History Problem Focused Assessment - Including review of records relating to presenting problem    Occupational performance deficits (Please refer to evaluation for details): ADL's;IADL's    Body Structure / Function / Physical Skills ADL;FMC;ROM;IADL;Pain;Coordination    Rehab Potential Fair    Clinical Decision Making Several treatment options, min-mod task modification necessary    Comorbidities Affecting Occupational Performance: May have comorbidities impacting occupational performance    Modification or Assistance to Complete Evaluation  Min-Moderate modification of tasks or assist with assess necessary to complete eval    OT Frequency 2x / week    OT Duration 12 weeks    OT Treatment/Interventions Self-care/ADL training;Patient/family education;DME and/or AE instruction;Therapeutic exercise;Neuromuscular education    Consulted and Agree with Plan of Care Patient           Patient will benefit from skilled therapeutic intervention in order to improve the following deficits and impairments:   Body Structure / Function / Physical Skills: ADL, FMC, ROM, IADL, Pain, Coordination       Visit Diagnosis: Muscle weakness (generalized)  Other lack of coordination    Problem List Patient Active  Problem List   Diagnosis Date Noted  . Acute CVA (cerebrovascular accident) ([place]) 05/02/2016  . Left-sided weakness 05/01/2016    Harrel Carina, MS, OTR/L 11/15/2019, 2:36 PM  [place] MAIN [facility] SERVICES [address] [place], [place], [address] Phone: [phone]   Fax:  [phone]  Name: MARTIN BELLING MRN: [id number] Date of Birth: 1966/01/07

## 2019-11-17 ENCOUNTER — Ambulatory Visit: Payer: Medicare HMO

## 2019-11-17 ENCOUNTER — Ambulatory Visit: Payer: Medicare HMO | Admitting: Occupational Therapy

## 2019-11-17 ENCOUNTER — Encounter: Payer: Self-pay | Admitting: Occupational Therapy

## 2019-11-17 ENCOUNTER — Other Ambulatory Visit: Payer: Self-pay

## 2019-11-17 DIAGNOSIS — I639 Cerebral infarction, unspecified: Secondary | ICD-10-CM

## 2019-11-17 DIAGNOSIS — I69351 Hemiplegia and hemiparesis following cerebral infarction affecting right dominant side: Secondary | ICD-10-CM

## 2019-11-17 DIAGNOSIS — R2689 Other abnormalities of gait and mobility: Secondary | ICD-10-CM

## 2019-11-17 DIAGNOSIS — M6281 Muscle weakness (generalized): Secondary | ICD-10-CM

## 2019-11-17 NOTE — Therapy (Signed)
[place] MAIN [facility] SERVICES [address] [place], [place], [address] Phone: [phone]   Fax:  [phone]  Physical Therapy Treatment  Patient Details  Name: Curtis Powell MRN: [id number] Date of Birth: 07/02/1965 No data recorded  Encounter Date: 11/17/2019   PT End of Session - 11/17/19 1722    Visit Number [id number]    Number of Visits 229    Date for PT Re-Evaluation 01/12/20    Authorization Type 3/10 PN 11/08/19    Authorization Time Period authorized 11/16/18-05/19/19    PT Start Time 1345    PT Stop Time 1429    PT Time Calculation (min) 44 min    Equipment Utilized During Treatment Gait belt    Activity Tolerance Patient tolerated treatment well;No increased pain    Behavior During Therapy WFL for tasks assessed/performed           Past Medical History:  Diagnosis Date  . GERD (gastroesophageal reflux disease)   . Hyperlipidemia   . Hypertension   . Obesity   . Stroke [facility])     Past Surgical History:  Procedure Laterality Date  . BRAIN SURGERY      There were no vitals filed for this visit.   Subjective Assessment - 11/17/19 1721    Subjective Patient  presents with his mom, is excited to practice getting into/out of and moving within bed.    Pertinent History  Patient is a pleasant 54 year old male who presents to physical therapy for weakness and immobility secondary to CVA.  Had a stroke in Feb 2018. He was previously fully independent, but this stroke caused severe residual deficits, mainly on the right side as well as speech, and now he is unable to walk or perform most of his ADLs on his own. Entire right side is very weak. He still has a little difficulty with speech but his swallowing is improved to baseline. His mom had to move in with him and now is his main caregiver.     Limitations Sitting;Lifting;Standing;Walking;House hold activities    How long can you sit comfortably? 5 minutes    How long can you stand  comfortably? 5 minutes    How long can you walk comfortably? 10 ft    Patient Stated Goals Walk without walker, walk further with walker, get some strength in back     Currently in Pain? No/denies             education and performance on bed mobility with mother present for carryover to home and while on vacation to hotel bed. Use of walker on side of bed as makeshift railing, cues for breakdown of sequencing: sitting on edge of bed as close to pillows as possible, come down onto elbow and lift feet up onto bed, roll onto back, then to return to seated, roll and reach with arm to RW /rail, bend LE furthest from side of bed, roll to side bring legs off table and push up with elbow to sitting x 6  Scooting up/down, left and right in bed with use of bridging and occasional stabilization of feet for support.   Sit to stand transfer from varying heights/surfaces, and chair types with cues for hand placement and gluteal activation x 8  Static stand w/o Ue support, with close CGA   -shadow boxing for pertubation's and UE reaching x 60 seconds -alternating UE raises 10x each UE, 2 sets -static stand 60 seconds -hip shift with occasional  Min A to return to COM x 15 each side   [id number] x CGA with RW, cues for foot clearance, upright posture, tactile cueing to PSIS and ASIS for body mechanics and sequencing;Able toturn with good mechanics keeping feet inside walker,    education and performance of pushing foot rests away and together to stand up and sit down.  Pt educated throughout session about proper posture and technique with exercises. Improved exercise technique, movement at target joints, use of target muscles after min to mod verbal, visual, tactile cues                    PT Education - 11/17/19 1722    Education provided Yes    Education Details bed mobility, transfers    Person(s) Educated Patient;Parent(s)    Methods  Explanation;Demonstration;Tactile cues;Verbal cues;Handout    Comprehension Verbalized understanding;Returned demonstration;Verbal cues required;Tactile cues required            PT Short Term Goals - 10/20/19 1722      PT SHORT TERM GOAL #1   Title Patient will be independent in home exercise program to improve strength/mobility for better functional independence with ADLs.    Baseline hep compliant    Time 2    Period Weeks    Status Achieved      PT SHORT TERM GOAL #2   Title Patient will require min cueing for STS transfer with CGA for increased independence with mobility.     Baseline CGA    Time 2    Period Weeks    Status Achieved      PT SHORT TERM GOAL #3   Title Patient will maintain upright posture for > 15 seconds to demonstrate strengthened postural control muscualture     Baseline 18 seconds     Time 2    Period Weeks    Status Achieved             PT Long Term Goals - 10/20/19 0001      PT LONG TERM GOAL #1   Title Patient will perform bed mobility (rolling and supine <>sit) with Mod I  to increase mobility and  perform ADLs.     Baseline 7/14: Min A 4/21: unable to perform 6/15: improving but still min-mod A     Time 12    Period Weeks    Status Partially Met    Target Date 01/12/20      PT LONG TERM GOAL #2   Title Patient (< 746years old) will complete five times sit to stand test in < 10 seconds indicating an increased LE strength and improved balance.    Baseline  7/14: 14.31 seconds 6/21: 16.86 seconds 4/21: 18.8 seconds 06/02/19: 19.93 seconds one hand on RW, one hand on w/c 12/30: 16 secpmds BUE support 02/11/19: 36 seconds 5/19: 20.6s from [place] one hand on walker and one on WC, 80% upright posture 7/1: 22 seconds one hand on walker one hand on w/c  9/14: 48 seconds with full upright posture    Time 12    Period Weeks    Status Partially Met    Target Date 01/12/20      PT LONG TERM GOAL #3   Title Patient will negotiate a bathroom with a  walker and Min A for increased indep in home and external environment    Baseline 7/14: unable to do     Time 12    Period Weeks    Status  New    Target Date 01/12/20      PT LONG TERM GOAL #4   Title Patient will increase BLE gross strength to 4+/5 as to improve functional strength for independent gait, increased standing tolerance and increased ADL ability.    Baseline  4/21: see note 06/02/19: hip flexion 4-/5, extension/abd 3/5 12/30: hip flexion 4/5 hip extension and abduction 3/5 02/11/19: hip flexion 4/5 hip extension and abduction 2+/54-/5 RLE, 10/15: 4/5 RLE 11/7: 4/5 RLE 3/3: 4/5 7/1:  hip flexion 4/5 hip extension 2/5 hip abduction/adduciton 2/5     Period Weeks    Status Partially Met    Target Date 01/12/20      PT LONG TERM GOAL #6   Title Patient will ambulate 200 ft with least assistive device and Supervision with no breaks to allow for increased mobility within home.    Baseline 7/14: 190 ft with RW 4/21: ambulate 150 ft 6/21: had to terminate after 90 ft due to cardiac     Time 12    Period Weeks    Status Partially Met    Target Date 01/12/20      PT LONG TERM GOAL #7   Title Patient will perform 10 MWT in <30 seconds for improved gait speed, gait mechanics, and functional capacity for mobility.     Baseline  7/14: 37.98 seconds 6/21: 46.9 seconds with RW 07/28/19: 51 seconds with RW 06/02/19: 62.5 seconds with RW 11/4: 56 seconds with AFO and RW 12/30: 53 seconds with AFO and RW     Time 12    Period Weeks    Status Partially Met    Target Date 01/12/20      PT LONG TERM GOAL #8   Title Patient will increase Berg Balance score by > 6 points (38/56) to demonstrate decreased fall risk during functional activities.    Baseline 7/14: 33/56 07/28/19: 32/56 6/21: 32/56     Time 12    Period Weeks    Status Partially Met    Target Date 01/12/20                 Plan - 11/17/19 1725    Clinical Impression Statement Patient presents to physical therapy with  excellent motivation. Mother is accompanying patient to learn how to assist in transfers in regards to cueing and stabilization placement of hands. Patient demonstrates safe bed mobility and transfers. Patient continues to progress with functional stability and mobility. Patient will continue to benefit from skilled therapy in order to increase strength and endurance    Rehab Potential Fair    Clinical Impairments Affecting Rehab Potential hx of HTN, HLD, CVA, learning disability, Diabetes, brain tumor,     PT Frequency 2x / week    PT Duration 12 weeks    PT Treatment/Interventions ADLs/Self Care Home Management;Aquatic Therapy;Ultrasound;Moist Heat;Traction;DME Instruction;Gait training;Stair training;Functional mobility training;Therapeutic activities;Therapeutic exercise;Orthotic Fit/Training;Neuromuscular re-education;Balance training;Patient/family education;Manual techniques;Wheelchair mobility training;Passive range of motion;Energy conservation;Taping;Visual/perceptual remediation/compensation    PT Next Visit Plan progress standing balance    PT Home Exercise Plan TrA contraction, glute sets: needs updates. Potentially some time in supine flat in hospital bed, to improve hip extension deficits.     Consulted and Agree with Plan of Care Patient           Patient will benefit from skilled therapeutic intervention in order to improve the following deficits and impairments:  Abnormal gait, Decreased activity tolerance, Decreased balance, Decreased knowledge of precautions, Decreased endurance, Decreased coordination, Decreased knowledge  of use of DME, Decreased mobility, Decreased range of motion, Difficulty walking, Decreased safety awareness, Decreased strength, Impaired flexibility, Impaired perceived functional ability, Impaired tone, Postural dysfunction, Improper body mechanics, Pain  Visit Diagnosis: Muscle weakness (generalized)  Hemiplegia and hemiparesis following cerebral  infarction affecting right dominant side (HCC)  Other abnormalities of gait and mobility  Acute CVA (cerebrovascular accident) [facility])     Problem List Patient Active Problem List   Diagnosis Date Noted  . Acute CVA (cerebrovascular accident) ([place]) 05/02/2016  . Left-sided weakness 05/01/2016   Janna Arch, PT, DPT   11/17/2019, 5:26 PM  [place] MAIN [facility] SERVICES [address] [place], [place], [address] Phone: [phone]   Fax:  [phone]  Name: Curtis Powell MRN: [id number] Date of Birth: [birth year]

## 2019-11-17 NOTE — Therapy (Addendum)
[place] MAIN [facility] SERVICES [address] [place], [place], [address] Phone: [phone]   Fax:  [phone]  Occupational Therapy Treatment  Patient Details  Name: Curtis Powell MRN: [id number] Date of Birth: 04-14-1965 No data recorded  Encounter Date: 11/17/2019   OT End of Session - 11/17/19 1437    Visit Number [id number]    Number of Visits 234    Date for OT Re-Evaluation 01/10/20    Authorization Type Progress reporting period starting 09/15/2019    OT Start Time 1430    OT Stop Time 1515    OT Time Calculation (min) 45 min    Equipment Utilized During Treatment built up pen and adaptive scissors    Activity Tolerance Patient tolerated treatment well    Behavior During Therapy WFL for tasks assessed/performed           Past Medical History:  Diagnosis Date  . GERD (gastroesophageal reflux disease)   . Hyperlipidemia   . Hypertension   . Obesity   . Stroke [facility])     Past Surgical History:  Procedure Laterality Date  . BRAIN SURGERY      There were no vitals filed for this visit.   Subjective Assessment - 11/17/19 1437    Subjective  Patient reports that he wants to work on strengthening.    Patient is accompanied by: Family member    Pertinent History Pt. is a 54 y.o. male who suffered a CVA on 05/01/2016. Pt. was admitted to the hospital. Once discharged, he received Home Health PT and OT services for about a month. Pt. has had multiple CVAs over the past 8 years, and has had multiple falls in the past 6 months. Pt. resides in an apartment. Pt. has caregivers for 80 hours. Pt.'s mother stays with pt. at night, and assists with IADL tasks.      Currently in Pain? No/denies          OT TREATMENT    Therapeutic Exercise:  Pt. performed 3# dowel ex. for UE strengthening secondary to weakness. Bilateral shoulder flexion, chest press, and circular patterns were performed for 2 sets 10 reps, 3# dumbbell ex. for elbow flexion  and extension, forearm supination/pronation, wrist flexion/extension, and radial deviation. Pt. requires rest breaks and verbal cues for proper technique. 1-2 sets 10-20 reps each.  Pt. continues to get ready for his trip to [place] at the end of August for a [employer]. Pt. reports that he is consistently doing more at home. Pt. continues to be able to make sandwiches at home, after retrieving items from w/c height. Pt. is improving with right hand strength, and [facility] skills. Pt. continues to present with limited BUE strength, and [facility] skills. Pt. continues to work on improving UE strength, and [facility] skills in order to work towards maximizing independence with ADLs, and IADLs.                      OT Education - 11/17/19 1437    Education provided Yes    Education Details reaching, weights    Person(s) Educated Patient    Methods Explanation;Demonstration    Comprehension Verbalized understanding;Returned demonstration;Verbal cues required            OT Short Term Goals - 03/03/17 1459      OT SHORT TERM GOAL #1   Title `             OT Long Term Goals -  10/27/19 1314      OT LONG TERM GOAL #4   Title Pt. will perform self dressing with minA and A/E as needed.    Baseline Pt. continues to be able to donn a T shirt independently with increased time. Min-modA donning a jacket, Mod A donning pants over his feet, and socks, minA with shoes.    Time 12    Period Weeks    Status Partially Met    Target Date 01/10/20      OT LONG TERM GOAL #6   Title Pt. will write his name efficiently with 100% legibility    Baseline Pt. continues to present with 75% legibility with increased time required when printing his name, and 50% legibility when cursive writing his name to sign in when riding the [place]    Time 12    Period Weeks    Status On-going    Target Date 01/10/20      OT LONG TERM GOAL #7   Title Pt will independently and consistently follow HEP to increase  UE strength to increase functional independence    Baseline Pt. continues to work towards independence with exercises    Time 12    Period Weeks    Status On-going    Target Date 01/10/20      OT LONG TERM GOAL #8   Title Pt. will require supervision ironing a shirt.    Baseline Pt. has progressed CGA, and complete set-up seated using a tabletop iron    Time 12    Period Weeks    Status On-going    Target Date 01/10/20      OT LONG TERM GOAL  #9   Baseline Pt. will independently be able to sew a button onto a shirt.    Time 12    Period Weeks    Status On-going    Target Date 01/10/20      OT LONG TERM GOAL  #10   TITLE Pt. will independently be able to throw a ball    Baseline Pt. continues to be limited with throwing overhand, however pt. is able to perform underhand throwing.    Time 12    Period Weeks    Status On-going    Target Date 01/10/20      OT LONG TERM GOAL  #11   TITLE Pt. will improve UE functional reaching to be able to independently use his RUE to hand his clothes in the closest.    Baseline Pt. continues to improve with reaching up with the RUE, however has difficulty reaching the hanger rack in the closet.    Time 12    Period Weeks    Status On-going    Target Date 01/10/20      OT LONG TERM GOAL  #13   TITLE Pt. will increase BUE strength to be able to prepare for assisting with yardwork.    Baseline Pt. BUE strength 4-/5. Pt. reports that he has been able to get out to the front.    Time 12    Period Weeks    Status On-going                 Plan - 11/17/19 1438    Clinical Impression Statement Pt. continues to get ready for his trip to [place] at the end of August for a [employer]. Pt. reports that he is consistently doing more at home. Pt. continues to be able to make sandwiches at home, after  retrieving items from w/c height. Pt. is improving with right hand strength, and [facility] skills. Pt. continues to present with limited BUE  strength, and [facility] skills. Pt. continues to work on improving UE strength, and [facility] skills in order to work towards maximizing independence with ADLs, and IADLs.   OT Occupational Profile and History Problem Focused Assessment - Including review of records relating to presenting problem    Occupational performance deficits (Please refer to evaluation for details): ADL's;IADL's    Body Structure / Function / Physical Skills ADL;FMC;ROM;IADL;Pain;Coordination    Clinical Decision Making Several treatment options, min-mod task modification necessary    Comorbidities Affecting Occupational Performance: May have comorbidities impacting occupational performance    Modification or Assistance to Complete Evaluation  Min-Moderate modification of tasks or assist with assess necessary to complete eval    OT Frequency 2x / week    OT Duration 12 weeks    OT Treatment/Interventions Self-care/ADL training;Patient/family education;DME and/or AE instruction;Therapeutic exercise;Neuromuscular education    Consulted and Agree with Plan of Care Patient           Patient will benefit from skilled therapeutic intervention in order to improve the following deficits and impairments:   Body Structure / Function / Physical Skills: ADL, FMC, ROM, IADL, Pain, Coordination       Visit Diagnosis: Muscle weakness (generalized)    Problem List Patient Active Problem List   Diagnosis Date Noted  . Acute CVA (cerebrovascular accident) ([place]) 05/02/2016  . Left-sided weakness 05/01/2016    Harrel Carina, MS, OTR/L 11/17/2019, 2:47 PM  [place] MAIN [facility] SERVICES [address] [place], [place], [address] Phone: [phone]   Fax:  [phone]  Name: Curtis Powell MRN: [id number] Date of Birth: [birth year]

## 2019-11-22 ENCOUNTER — Encounter: Payer: Self-pay | Admitting: Occupational Therapy

## 2019-11-22 ENCOUNTER — Ambulatory Visit: Payer: Medicare HMO

## 2019-11-22 ENCOUNTER — Ambulatory Visit: Payer: Medicare HMO | Admitting: Occupational Therapy

## 2019-11-22 ENCOUNTER — Other Ambulatory Visit: Payer: Self-pay

## 2019-11-22 DIAGNOSIS — R278 Other lack of coordination: Secondary | ICD-10-CM

## 2019-11-22 DIAGNOSIS — I639 Cerebral infarction, unspecified: Secondary | ICD-10-CM

## 2019-11-22 DIAGNOSIS — M6281 Muscle weakness (generalized): Secondary | ICD-10-CM | POA: Diagnosis not present

## 2019-11-22 DIAGNOSIS — R2689 Other abnormalities of gait and mobility: Secondary | ICD-10-CM

## 2019-11-22 DIAGNOSIS — I69351 Hemiplegia and hemiparesis following cerebral infarction affecting right dominant side: Secondary | ICD-10-CM

## 2019-11-22 NOTE — Therapy (Signed)
[place] MAIN [facility] SERVICES [address] [place], [place], [address] Phone: [phone]   Fax:  [phone]  Physical Therapy Treatment  Patient Details  Name: Curtis Powell MRN: [id number] Date of Birth: 10/12/1965 No data recorded  Encounter Date: 11/22/2019   PT End of Session - 11/22/19 1438    Visit Number [id number]    Number of Visits 229    Date for PT Re-Evaluation 01/12/20    Authorization Type 4/10 PN 11/08/19    Authorization Time Period authorized 11/16/18-05/19/19    PT Start Time 1345    PT Stop Time 1430    PT Time Calculation (min) 45 min    Equipment Utilized During Treatment Gait belt    Activity Tolerance Patient tolerated treatment well;No increased pain    Behavior During Therapy WFL for tasks assessed/performed           Past Medical History:  Diagnosis Date  . GERD (gastroesophageal reflux disease)   . Hyperlipidemia   . Hypertension   . Obesity   . Stroke [facility])     Past Surgical History:  Procedure Laterality Date  . BRAIN SURGERY      There were no vitals filed for this visit.   Subjective Assessment - 11/22/19 1437    Subjective Patient requests paper directions of getting into/out of bed. Has been compliant with HEP. No falls or LOB since last session.    Pertinent History  Patient is a pleasant 54 year old male who presents to physical therapy for weakness and immobility secondary to CVA.  Had a stroke in Feb 2018. He was previously fully independent, but this stroke caused severe residual deficits, mainly on the right side as well as speech, and now he is unable to walk or perform most of his ADLs on his own. Entire right side is very weak. He still has a little difficulty with speech but his swallowing is improved to baseline. His mom had to move in with him and now is his main caregiver.     Limitations Sitting;Lifting;Standing;Walking;House hold activities    How long can you sit comfortably? 5 minutes     How long can you stand comfortably? 5 minutes    How long can you walk comfortably? 10 ft    Patient Stated Goals Walk without walker, walk further with walker, get some strength in back     Currently in Pain? No/denies               Vitals at start of session 114/59 pulse 85    Access Code: [email or web address] URL: [email or web address] Date: 11/22/2019 Prepared by: Janna Arch  Exercises Sitting to Supine Roll - 1 x daily - 7 x weekly - 2 sets - 10 reps - 5 hold Supine to Seated Roll out of Bed with PLB - 1 x daily - 7 x weekly - 2 sets - 10 reps - 5 hold   Ambulating with RW:     ambulate 185 ft x CGA with RW, cues for foot clearance, upright posture, tactile cueing to PSIS and ASIS for body mechanics and sequencing; Able to turn with good mechanics keeping feet inside walker, one standing rest break   Static stand w RW in front for stability: close CGA    Stand with one hand on RW other hand reaching and transferring cones from one side of table to the other for carryover to daily task performance such as iADLs in the kitchen, lateral transfer  x 10 each UE, forward transfer x10 each side   Stand with UE support 4" step toe taps 10x each LE, very challenging.    Stand with shadow box, occasional Min A to return to [facility], able to perform twenty reciprocating punches with one episode of LOB   Mini knee squats with UE support on RW 10x ; wheelchair behind     seated: rainbow ball toss for pertubation, coordination, and reaction timing Seated overhead rainbow ball hold with march 15x each LE  GTB hamstring curl against PT resistance 15x each LE GTB around bilateral ankles: alternating LAQ 12x each LE Airplane lateral sidebends with return to center of mass 10x each side    education and performance of pushing foot rests away and together to stand up and sit down.       Pt educated throughout session about proper posture and technique with exercises. Improved  exercise technique, movement at target joints, use of target muscles after min to mod verbal, visual, tactile cues          PT Education - 11/22/19 1438    Education provided Yes    Education Details exercise technique, body mechanics    Person(s) Educated Patient    Methods Explanation;Demonstration;Tactile cues;Verbal cues    Comprehension Verbalized understanding;Returned demonstration;Verbal cues required;Tactile cues required            PT Short Term Goals - 10/20/19 1722      PT SHORT TERM GOAL #1   Title Patient will be independent in home exercise program to improve strength/mobility for better functional independence with ADLs.    Baseline hep compliant    Time 2    Period Weeks    Status Achieved      PT SHORT TERM GOAL #2   Title Patient will require min cueing for STS transfer with CGA for increased independence with mobility.     Baseline CGA    Time 2    Period Weeks    Status Achieved      PT SHORT TERM GOAL #3   Title Patient will maintain upright posture for > 15 seconds to demonstrate strengthened postural control muscualture     Baseline 18 seconds     Time 2    Period Weeks    Status Achieved             PT Long Term Goals - 10/20/19 0001      PT LONG TERM GOAL #1   Title Patient will perform bed mobility (rolling and supine <>sit) with Mod I  to increase mobility and  perform ADLs.     Baseline 7/14: Min A 4/21: unable to perform 6/15: improving but still min-mod A     Time 12    Period Weeks    Status Partially Met    Target Date 01/12/20      PT LONG TERM GOAL #2   Title Patient (< 37 years old) will complete five times sit to stand test in < 10 seconds indicating an increased LE strength and improved balance.    Baseline  7/14: 14.31 seconds 6/21: 16.86 seconds 4/21: 18.8 seconds 06/02/19: 19.93 seconds one hand on RW, one hand on w/c 12/30: 16 secpmds BUE support 02/11/19: 36 seconds 5/19: 20.6s from [place], one hand on walker and one on  WC, 80% upright posture 7/1: 22 seconds one hand on walker one hand on w/c  9/14: 48 seconds with full upright posture    Time 12  Period Weeks    Status Partially Met    Target Date 01/12/20      PT LONG TERM GOAL #3   Title Patient will negotiate a bathroom with a walker and Min A for increased indep in home and external environment    Baseline 7/14: unable to do     Time 12    Period Weeks    Status New    Target Date 01/12/20      PT LONG TERM GOAL #4   Title Patient will increase BLE gross strength to 4+/5 as to improve functional strength for independent gait, increased standing tolerance and increased ADL ability.    Baseline  4/21: see note 06/02/19: hip flexion 4-/5, extension/abd 3/5 12/30: hip flexion 4/5 hip extension and abduction 3/5 02/11/19: hip flexion 4/5 hip extension and abduction 2+/54-/5 RLE, 10/15: 4/5 RLE 11/7: 4/5 RLE 3/3: 4/5 7/1:  hip flexion 4/5 hip extension 2/5 hip abduction/adduciton 2/5     Period Weeks    Status Partially Met    Target Date 01/12/20      PT LONG TERM GOAL #6   Title Patient will ambulate 200 ft with least assistive device and Supervision with no breaks to allow for increased mobility within home.    Baseline 7/14: 190 ft with RW 4/21: ambulate 150 ft 6/21: had to terminate after 90 ft due to cardiac     Time 12    Period Weeks    Status Partially Met    Target Date 01/12/20      PT LONG TERM GOAL #7   Title Patient will perform 10 MWT in <30 seconds for improved gait speed, gait mechanics, and functional capacity for mobility.     Baseline  7/14: 37.98 seconds 6/21: 46.9 seconds with RW 07/28/19: 51 seconds with RW 06/02/19: 62.5 seconds with RW 11/4: 56 seconds with AFO and RW 12/30: 53 seconds with AFO and RW     Time 12    Period Weeks    Status Partially Met    Target Date 01/12/20      PT LONG TERM GOAL #8   Title Patient will increase Berg Balance score by > 6 points (38/56) to demonstrate decreased fall risk during  functional activities.    Baseline 7/14: 33/56 07/28/19: 32/56 6/21: 32/56     Time 12    Period Weeks    Status Partially Met    Target Date 01/12/20                 Plan - 11/22/19 1439    Clinical Impression Statement Patient is progressing with functional standing balance with increased reaching range as well as duration of standing with reaching. Patient does continue to require cueing for gluteal squeeze and upright posture with prolonged standing due to preference for forward reach. Patient continues to progress with functional stability and mobility. Patient will continue to benefit from skilled therapy in order to increase strength and endurance    Rehab Potential Fair    Clinical Impairments Affecting Rehab Potential hx of HTN, HLD, CVA, learning disability, Diabetes, brain tumor,     PT Frequency 2x / week    PT Duration 12 weeks    PT Treatment/Interventions ADLs/Self Care Home Management;Aquatic Therapy;Ultrasound;Moist Heat;Traction;DME Instruction;Gait training;Stair training;Functional mobility training;Therapeutic activities;Therapeutic exercise;Orthotic Fit/Training;Neuromuscular re-education;Balance training;Patient/family education;Manual techniques;Wheelchair mobility training;Passive range of motion;Energy conservation;Taping;Visual/perceptual remediation/compensation    PT Next Visit Plan progress standing balance    PT Home Exercise Plan TrA contraction,  glute sets: needs updates. Potentially some time in supine flat in hospital bed, to improve hip extension deficits.     Consulted and Agree with Plan of Care Patient           Patient will benefit from skilled therapeutic intervention in order to improve the following deficits and impairments:  Abnormal gait, Decreased activity tolerance, Decreased balance, Decreased knowledge of precautions, Decreased endurance, Decreased coordination, Decreased knowledge of use of DME, Decreased mobility, Decreased range of  motion, Difficulty walking, Decreased safety awareness, Decreased strength, Impaired flexibility, Impaired perceived functional ability, Impaired tone, Postural dysfunction, Improper body mechanics, Pain  Visit Diagnosis: Muscle weakness (generalized)  Hemiplegia and hemiparesis following cerebral infarction affecting right dominant side (HCC)  Other abnormalities of gait and mobility  Acute CVA (cerebrovascular accident) [facility])     Problem List Patient Active Problem List   Diagnosis Date Noted  . Acute CVA (cerebrovascular accident) ([place]) 05/02/2016  . Left-sided weakness 05/01/2016   Janna Arch, PT, DPT   11/22/2019, 2:42 PM  [place] MAIN [facility] SERVICES [address] [place], [place], [address] Phone: [phone]   Fax:  [phone]  Name: Curtis Powell MRN: [id number] Date of Birth: 1965-05-30

## 2019-11-22 NOTE — Therapy (Addendum)
[place] MAIN [facility] SERVICES [address] [place], [place], [address] Phone: [phone]   Fax:  [phone]  Occupational Therapy Treatment  Patient Details  Name: Curtis Powell MRN: [id number] Date of Birth: June 26, 1965 No data recorded  Encounter Date: 11/22/2019   OT End of Session - 11/22/19 1445    Visit Number [id number]    Number of Visits 234    Date for OT Re-Evaluation 01/10/20    Authorization Type Progress reporting period starting 09/15/2019    OT Start Time 1435    OT Stop Time 1515    OT Time Calculation (min) 40 min    Equipment Utilized During Treatment built up pen and adaptive scissors    Activity Tolerance Patient tolerated treatment well    Behavior During Therapy WFL for tasks assessed/performed           Past Medical History:  Diagnosis Date  . GERD (gastroesophageal reflux disease)   . Hyperlipidemia   . Hypertension   . Obesity   . Stroke [facility])     Past Surgical History:  Procedure Laterality Date  . BRAIN SURGERY      There were no vitals filed for this visit.   Subjective Assessment - 11/22/19 1444    Subjective  Patient reports that he wants to work on strengthening.    Patient is accompanied by: Family member    Pertinent History Pt. is a 54 y.o. male who suffered a CVA on 05/01/2016. Pt. was admitted to the hospital. Once discharged, he received Home Health PT and OT services for about a month. Pt. has had multiple CVAs over the past 8 years, and has had multiple falls in the past 6 months. Pt. resides in an apartment. Pt. has caregivers for 80 hours. Pt.'s mother stays with pt. at night, and assists with IADL tasks.      Currently in Pain? No/denies          OT TREATMENT   Therapeutic Exercise:  Pt. performed3# dowel ex.for UE strengthening secondary to weakness. Bilateral shoulder flexion, chest press,andcircular patterns were performed for 2 sets 10 reps, 3# dumbbell ex. for elbow flexion  and extension, forearm supination/pronation, wrist flexion/extension, and radial deviation. Pt. requires rest breaks and verbal cues for proper technique.1-2 sets 10-20 reps each.  Pt. continues to anticipate his trip to [place] at the end of August for a [employer]. Pt. reports that he is consistently doing more at home to engage his UE's. Pt. continues to make progress overall. Pt. is improving with right hand strength, and [facility] skills.Pt. continues to present with limited BUE strength, and [facility] skills. Pt.continues to work on improving UE strength, and [facility] skills in order to work towards maximizing independence with ADLs, and IADLs.                       OT Education - 11/22/19 1445    Education provided Yes    Education Details reaching, weights    Person(s) Educated Patient    Methods Explanation;Demonstration    Comprehension Verbalized understanding;Returned demonstration;Verbal cues required            OT Short Term Goals - 03/03/17 1459      OT SHORT TERM GOAL #1   Title `             OT Long Term Goals - 10/27/19 1314      OT LONG TERM GOAL #4  Title Pt. will perform self dressing with minA and A/E as needed.    Baseline Pt. continues to be able to donn a T shirt independently with increased time. Min-modA donning a jacket, Mod A donning pants over his feet, and socks, minA with shoes.    Time 12    Period Weeks    Status Partially Met    Target Date 01/10/20      OT LONG TERM GOAL #6   Title Pt. will write his name efficiently with 100% legibility    Baseline Pt. continues to present with 75% legibility with increased time required when printing his name, and 50% legibility when cursive writing his name to sign in when riding the [place]    Time 12    Period Weeks    Status On-going    Target Date 01/10/20      OT LONG TERM GOAL #7   Title Pt will independently and consistently follow HEP to increase UE strength to increase  functional independence    Baseline Pt. continues to work towards independence with exercises    Time 12    Period Weeks    Status On-going    Target Date 01/10/20      OT LONG TERM GOAL #8   Title Pt. will require supervision ironing a shirt.    Baseline Pt. has progressed CGA, and complete set-up seated using a tabletop iron    Time 12    Period Weeks    Status On-going    Target Date 01/10/20      OT LONG TERM GOAL  #9   Baseline Pt. will independently be able to sew a button onto a shirt.    Time 12    Period Weeks    Status On-going    Target Date 01/10/20      OT LONG TERM GOAL  #10   TITLE Pt. will independently be able to throw a ball    Baseline Pt. continues to be limited with throwing overhand, however pt. is able to perform underhand throwing.    Time 12    Period Weeks    Status On-going    Target Date 01/10/20      OT LONG TERM GOAL  #11   TITLE Pt. will improve UE functional reaching to be able to independently use his RUE to hand his clothes in the closest.    Baseline Pt. continues to improve with reaching up with the RUE, however has difficulty reaching the hanger rack in the closet.    Time 12    Period Weeks    Status On-going    Target Date 01/10/20      OT LONG TERM GOAL  #13   TITLE Pt. will increase BUE strength to be able to prepare for assisting with yardwork.    Baseline Pt. BUE strength 4-/5. Pt. reports that he has been able to get out to the front.    Time 12    Period Weeks    Status On-going                 Plan - 11/22/19 1445    Clinical Impression Statement Pt. continues to anticipate his trip to [place] at the end of August for a [employer]. Pt. reports that he is consistently doing more at home to engage his UE's. Pt. continues to make progress overall. Pt. is improving with right hand strength, and [facility] skills.Pt. continues to present with limited BUE strength,  and [facility] skills. Pt.continues to work on improving UE  strength, and [facility] skills in order to work towards maximizing independence with ADLs, and IADLs.   OT Occupational Profile and History Problem Focused Assessment - Including review of records relating to presenting problem    Occupational performance deficits (Please refer to evaluation for details): ADL's;IADL's    Body Structure / Function / Physical Skills ADL;FMC;ROM;IADL;Pain;Coordination    Rehab Potential Fair    Clinical Decision Making Several treatment options, min-mod task modification necessary    Comorbidities Affecting Occupational Performance: May have comorbidities impacting occupational performance    Modification or Assistance to Complete Evaluation  Min-Moderate modification of tasks or assist with assess necessary to complete eval    OT Frequency 2x / week    OT Duration 12 weeks    OT Treatment/Interventions Self-care/ADL training;Patient/family education;DME and/or AE instruction;Therapeutic exercise;Neuromuscular education    Consulted and Agree with Plan of Care Patient           Patient will benefit from skilled therapeutic intervention in order to improve the following deficits and impairments:   Body Structure / Function / Physical Skills: ADL, FMC, ROM, IADL, Pain, Coordination       Visit Diagnosis: Muscle weakness (generalized)  Other lack of coordination    Problem List Patient Active Problem List   Diagnosis Date Noted  . Acute CVA (cerebrovascular accident) ([place]) 05/02/2016  . Left-sided weakness 05/01/2016    Harrel Carina , MS, OTR/L 11/22/2019, 2:49 PM  [place] MAIN [facility] SERVICES [address] [place], [place], [address] Phone: [phone]   Fax:  [phone]  Name: Curtis Powell MRN: [id number] Date of Birth: 08-Mar-1966

## 2019-11-24 ENCOUNTER — Other Ambulatory Visit: Payer: Self-pay

## 2019-11-24 ENCOUNTER — Encounter: Payer: Self-pay | Admitting: Occupational Therapy

## 2019-11-24 ENCOUNTER — Ambulatory Visit: Payer: Medicare HMO | Admitting: Occupational Therapy

## 2019-11-24 ENCOUNTER — Ambulatory Visit: Payer: Medicare HMO

## 2019-11-24 DIAGNOSIS — I69351 Hemiplegia and hemiparesis following cerebral infarction affecting right dominant side: Secondary | ICD-10-CM

## 2019-11-24 DIAGNOSIS — R278 Other lack of coordination: Secondary | ICD-10-CM

## 2019-11-24 DIAGNOSIS — R531 Weakness: Secondary | ICD-10-CM

## 2019-11-24 DIAGNOSIS — R2689 Other abnormalities of gait and mobility: Secondary | ICD-10-CM

## 2019-11-24 DIAGNOSIS — M6281 Muscle weakness (generalized): Secondary | ICD-10-CM

## 2019-11-24 DIAGNOSIS — I639 Cerebral infarction, unspecified: Secondary | ICD-10-CM

## 2019-11-24 NOTE — Therapy (Signed)
[place] MAIN [facility] SERVICES [address] [place], [place], [address] Phone: [phone]   Fax:  [phone]  Physical Therapy Treatment  Patient Details  Name: Curtis Powell MRN: [id number] Date of Birth: [birth year] No data recorded  Encounter Date: 11/24/2019   PT End of Session - 11/24/19 1356    Visit Number [id number]    Number of Visits 229    Date for PT Re-Evaluation 01/12/20    PT Start Time 1351    PT Stop Time 1430    PT Time Calculation (min) 39 min    Equipment Utilized During Treatment Gait belt    Activity Tolerance Patient tolerated treatment well;No increased pain    Behavior During Therapy WFL for tasks assessed/performed           Past Medical History:  Diagnosis Date  . GERD (gastroesophageal reflux disease)   . Hyperlipidemia   . Hypertension   . Obesity   . Stroke [facility])     Past Surgical History:  Procedure Laterality Date  . BRAIN SURGERY      There were no vitals filed for this visit.   Subjective Assessment - 11/24/19 1355    Subjective Pt doing well this date. Reports no major updates since last session. He continues to work on walking at home with his aide.    Pertinent History  Patient is a pleasant 54 year old male who presents to physical therapy for weakness and immobility secondary to CVA.  Had a stroke in Feb 2018. He was previously fully independent, but this stroke caused severe residual deficits, mainly on the right side as well as speech, and now he is unable to walk or perform most of his ADLs on his own. Entire right side is very weak. He still has a little difficulty with speech but his swallowing is improved to baseline. His mom had to move in with him and now is his main caregiver.            INTERVENTION THIS DATE:   Ambulating with RW:  -2x152f, earlier onset fatigue than typical;  -assist in BR between walks, dressing assist and assist with soap/paper towel as needed.  -Multiple STS  in session with variable assist depending on chair and armrests - STS WC, standing balance with bimanual cabinet opening utensil manipulation/grasp moving to/from drawer to cabinet. 2x60sec (10 utensils)       Pt educated throughout session about proper posture and technique with exercises. Improved exercise technique, movement at target joints, use of target muscles after min to mod verbal, visual, tactile cues    PT Short Term Goals - 10/20/19 1722      PT SHORT TERM GOAL #1   Title Patient will be independent in home exercise program to improve strength/mobility for better functional independence with ADLs.    Baseline hep compliant    Time 2    Period Weeks    Status Achieved      PT SHORT TERM GOAL #2   Title Patient will require min cueing for STS transfer with CGA for increased independence with mobility.     Baseline CGA    Time 2    Period Weeks    Status Achieved      PT SHORT TERM GOAL #3   Title Patient will maintain upright posture for > 15 seconds to demonstrate strengthened postural control muscualture     Baseline 18 seconds     Time 2  Period Weeks    Status Achieved             PT Long Term Goals - 10/20/19 0001      PT LONG TERM GOAL #1   Title Patient will perform bed mobility (rolling and supine <>sit) with Mod I  to increase mobility and  perform ADLs.     Baseline 7/14: Min A 4/21: unable to perform 6/15: improving but still min-mod A     Time 12    Period Weeks    Status Partially Met    Target Date 01/12/20      PT LONG TERM GOAL #2   Title Patient (< 29 years old) will complete five times sit to stand test in < 10 seconds indicating an increased LE strength and improved balance.    Baseline  7/14: 14.31 seconds 6/21: 16.86 seconds 4/21: 18.8 seconds 06/02/19: 19.93 seconds one hand on RW, one hand on w/c 12/30: 16 secpmds BUE support 02/11/19: 36 seconds 5/19: 20.6s from [place], one hand on walker and one on WC, 80% upright posture 7/1: 22 seconds  one hand on walker one hand on w/c  9/14: 48 seconds with full upright posture    Time 12    Period Weeks    Status Partially Met    Target Date 01/12/20      PT LONG TERM GOAL #3   Title Patient will negotiate a bathroom with a walker and Min A for increased indep in home and external environment    Baseline 7/14: unable to do     Time 12    Period Weeks    Status New    Target Date 01/12/20      PT LONG TERM GOAL #4   Title Patient will increase BLE gross strength to 4+/5 as to improve functional strength for independent gait, increased standing tolerance and increased ADL ability.    Baseline  4/21: see note 06/02/19: hip flexion 4-/5, extension/abd 3/5 12/30: hip flexion 4/5 hip extension and abduction 3/5 02/11/19: hip flexion 4/5 hip extension and abduction 2+/54-/5 RLE, 10/15: 4/5 RLE 11/7: 4/5 RLE 3/3: 4/5 7/1:  hip flexion 4/5 hip extension 2/5 hip abduction/adduciton 2/5     Period Weeks    Status Partially Met    Target Date 01/12/20      PT LONG TERM GOAL #6   Title Patient will ambulate 200 ft with least assistive device and Supervision with no breaks to allow for increased mobility within home.    Baseline 7/14: 190 ft with RW 4/21: ambulate 150 ft 6/21: had to terminate after 90 ft due to cardiac     Time 12    Period Weeks    Status Partially Met    Target Date 01/12/20      PT LONG TERM GOAL #7   Title Patient will perform 10 MWT in <30 seconds for improved gait speed, gait mechanics, and functional capacity for mobility.     Baseline  7/14: 37.98 seconds 6/21: 46.9 seconds with RW 07/28/19: 51 seconds with RW 06/02/19: 62.5 seconds with RW 11/4: 56 seconds with AFO and RW 12/30: 53 seconds with AFO and RW     Time 12    Period Weeks    Status Partially Met    Target Date 01/12/20      PT LONG TERM GOAL #8   Title Patient will increase Berg Balance score by > 6 points (38/56) to demonstrate decreased fall risk during functional  activities.    Baseline 7/14: 33/56  07/28/19: 32/56 6/21: 32/56     Time 12    Period Weeks    Status Partially Met    Target Date 01/12/20                 Plan - 11/24/19 1444    Clinical Impression Statement Continued with current plan of care. Started with gait training, then moving to standing balance with bimanual tasks. Pt fatigues more quickly ten typical, requires more rest between shorter walks. Bimanual tasks are simulated in ADL kitchenfopr best transfer to home setting. Pt continues to make steady progress toward treatment goals in general. Author provides extensive verbal, visual, and tactile cues when needed to assure all interventions are performed with desired form and good accuracy. Extensive communicaiton to assure pt is able to perform all activities without exacerbation of pain or other symptoms.    Rehab Potential Fair    Clinical Impairments Affecting Rehab Potential hx of HTN, HLD, CVA, learning disability, Diabetes, brain tumor,     PT Frequency 2x / week    PT Duration 12 weeks    PT Treatment/Interventions ADLs/Self Care Home Management;Aquatic Therapy;Ultrasound;Moist Heat;Traction;DME Instruction;Gait training;Stair training;Functional mobility training;Therapeutic activities;Therapeutic exercise;Orthotic Fit/Training;Neuromuscular re-education;Balance training;Patient/family education;Manual techniques;Wheelchair mobility training;Passive range of motion;Energy conservation;Taping;Visual/perceptual remediation/compensation    PT Next Visit Plan progress standing balance    PT Home Exercise Plan TrA contraction, glute sets: needs updates. Potentially some time in supine flat in hospital bed, to improve hip extension deficits.     Consulted and Agree with Plan of Care Patient           Patient will benefit from skilled therapeutic intervention in order to improve the following deficits and impairments:  Abnormal gait, Decreased activity tolerance, Decreased balance, Decreased knowledge of  precautions, Decreased endurance, Decreased coordination, Decreased knowledge of use of DME, Decreased mobility, Decreased range of motion, Difficulty walking, Decreased safety awareness, Decreased strength, Impaired flexibility, Impaired perceived functional ability, Impaired tone, Postural dysfunction, Improper body mechanics, Pain  Visit Diagnosis: Muscle weakness (generalized)  Other lack of coordination  Hemiplegia and hemiparesis following cerebral infarction affecting right dominant side (HCC)  Other abnormalities of gait and mobility  Acute CVA (cerebrovascular accident) ([place])  Left-sided weakness     Problem List Patient Active Problem List   Diagnosis Date Noted  . Acute CVA (cerebrovascular accident) ([place]) 05/02/2016  . Left-sided weakness 05/01/2016   2:51 PM, 11/24/19 Etta Grandchild, PT, DPT Physical Therapist - [facility]  Outpatient Physical Therapy- [place] [phone]     Etta Grandchild 11/24/2019, 2:45 PM  [place] MAIN [facility] SERVICES [address] [place], [place], [address] Phone: [phone]   Fax:  [phone]  Name: Curtis Powell MRN: [id number] Date of Birth: 1965/05/22

## 2019-11-24 NOTE — Therapy (Addendum)
[place] MAIN [facility] SERVICES [address] [place], [place], [address] Phone: [phone]   Fax:  [phone]  Occupational Therapy Treatment  Patient Details  Name: Curtis Powell MRN: [id number] Date of Birth: [birth year] No data recorded  Encounter Date: 11/24/2019   OT End of Session - 11/24/19 1444    Visit Number [id number]    Number of Visits 234    Date for OT Re-Evaluation 01/10/20    Authorization Type Progress reporting period starting 09/15/2019    OT Start Time 1434    OT Stop Time 1515    OT Time Calculation (min) 41 min    Activity Tolerance Patient tolerated treatment well    Behavior During Therapy [facility] for tasks assessed/performed           Past Medical History:  Diagnosis Date  . GERD (gastroesophageal reflux disease)   . Hyperlipidemia   . Hypertension   . Obesity   . Stroke [facility])     Past Surgical History:  Procedure Laterality Date  . BRAIN SURGERY      There were no vitals filed for this visit.  OT TREATMENT   Therapeutic Exercise:  Pt. performed3.5# dowel ex.for UE strengthening secondary to weakness. Bilateral shoulder flexion, chest press,andcircular patterns were performedfor 2 sets 10 reps,3# dumbbell ex. for elbow flexion and extension, forearm supination/pronation, wrist flexion/extension, and radial deviation. Pt. requires rest breaks and verbal cues for proper technique.1-2 sets 10-20 reps each.  Pt. reports that he is consistently doing more at home to engage his UE's. Pt. continues to work on UE functional reaching.  Pt. continues to make progress overall. Pt.is improving with right hand strength, and [facility] skills.Pt. continues to present with limited BUE strength, and [facility] skills. Pt.continues to work on improving UE strength, and [facility] skills in order to work towards maximizing independence with ADLs, and IADL tasks.                       OT Education - 11/24/19 1443     Education provided Yes    Person(s) Educated Patient    Methods Explanation;Demonstration    Comprehension Verbalized understanding;Returned demonstration;Verbal cues required            OT Short Term Goals - 03/03/17 1459      OT SHORT TERM GOAL #1   Title `             OT Long Term Goals - 10/27/19 1314      OT LONG TERM GOAL #4   Title Pt. will perform self dressing with minA and A/E as needed.    Baseline Pt. continues to be able to donn a T shirt independently with increased time. Min-modA donning a jacket, Mod A donning pants over his feet, and socks, minA with shoes.    Time 12    Period Weeks    Status Partially Met    Target Date 01/10/20      OT LONG TERM GOAL #6   Title Pt. will write his name efficiently with 100% legibility    Baseline Pt. continues to present with 75% legibility with increased time required when printing his name, and 50% legibility when cursive writing his name to sign in when riding the [place]    Time 12    Period Weeks    Status On-going    Target Date 01/10/20      OT LONG TERM GOAL #7   Title  Pt will independently and consistently follow HEP to increase UE strength to increase functional independence    Baseline Pt. continues to work towards independence with exercises    Time 12    Period Weeks    Status On-going    Target Date 01/10/20      OT LONG TERM GOAL #8   Title Pt. will require supervision ironing a shirt.    Baseline Pt. has progressed CGA, and complete set-up seated using a tabletop iron    Time 12    Period Weeks    Status On-going    Target Date 01/10/20      OT LONG TERM GOAL  #9   Baseline Pt. will independently be able to sew a button onto a shirt.    Time 12    Period Weeks    Status On-going    Target Date 01/10/20      OT LONG TERM GOAL  #10   TITLE Pt. will independently be able to throw a ball    Baseline Pt. continues to be limited with throwing overhand, however pt. is able to perform underhand  throwing.    Time 12    Period Weeks    Status On-going    Target Date 01/10/20      OT LONG TERM GOAL  #11   TITLE Pt. will improve UE functional reaching to be able to independently use his RUE to hand his clothes in the closest.    Baseline Pt. continues to improve with reaching up with the RUE, however has difficulty reaching the hanger rack in the closet.    Time 12    Period Weeks    Status On-going    Target Date 01/10/20      OT LONG TERM GOAL  #13   TITLE Pt. will increase BUE strength to be able to prepare for assisting with yardwork.    Baseline Pt. BUE strength 4-/5. Pt. reports that he has been able to get out to the front.    Time 12    Period Weeks    Status On-going                 Plan - 11/24/19 1445    Clinical Impression Statement Pt. reports that he is consistently doing more at home to engage his UE's. Pt. continues to work on UE functional reaching.  Pt. continues to make progress overall. Pt.is improving with right hand strength, and [facility] skills.Pt. continues to present with limited BUE strength, and [facility] skills. Pt.continues to work on improving UE strength, and [facility] skills in order to work towards maximizing independence with ADLs, and IADL tasks.   OT Occupational Profile and History Problem Focused Assessment - Including review of records relating to presenting problem    Occupational performance deficits (Please refer to evaluation for details): ADL's;IADL's    Body Structure / Function / Physical Skills ADL;FMC;ROM;IADL;Pain;Coordination    Rehab Potential Fair    Clinical Decision Making Several treatment options, min-mod task modification necessary    Comorbidities Affecting Occupational Performance: May have comorbidities impacting occupational performance    Modification or Assistance to Complete Evaluation  Min-Moderate modification of tasks or assist with assess necessary to complete eval    OT Frequency 2x / week    OT Duration 12 weeks     OT Treatment/Interventions Self-care/ADL training;Patient/family education;DME and/or AE instruction;Therapeutic exercise;Neuromuscular education    Consulted and Agree with Plan of Care Patient  Patient will benefit from skilled therapeutic intervention in order to improve the following deficits and impairments:   Body Structure / Function / Physical Skills: ADL, FMC, ROM, IADL, Pain, Coordination       Visit Diagnosis: Muscle weakness (generalized)  Other lack of coordination    Problem List Patient Active Problem List   Diagnosis Date Noted  . Acute CVA (cerebrovascular accident) ([place]) 05/02/2016  . Left-sided weakness 05/01/2016    Harrel Carina, MS, OTR/L 11/24/2019, 2:51 PM  [place] MAIN [facility] SERVICES [address] [place], [place], [address] Phone: [phone]   Fax:  [phone]  Name: Curtis Powell MRN: [id number] Date of Birth: 01/17/1966

## 2019-11-29 ENCOUNTER — Ambulatory Visit: Payer: Medicare HMO

## 2019-11-29 ENCOUNTER — Ambulatory Visit: Payer: Medicare HMO | Admitting: Occupational Therapy

## 2019-11-29 ENCOUNTER — Other Ambulatory Visit: Payer: Self-pay

## 2019-11-29 ENCOUNTER — Encounter: Payer: Self-pay | Admitting: Occupational Therapy

## 2019-11-29 DIAGNOSIS — R2689 Other abnormalities of gait and mobility: Secondary | ICD-10-CM

## 2019-11-29 DIAGNOSIS — M6281 Muscle weakness (generalized): Secondary | ICD-10-CM

## 2019-11-29 DIAGNOSIS — R278 Other lack of coordination: Secondary | ICD-10-CM

## 2019-11-29 DIAGNOSIS — I69351 Hemiplegia and hemiparesis following cerebral infarction affecting right dominant side: Secondary | ICD-10-CM

## 2019-11-29 NOTE — Therapy (Signed)
[place] MAIN [facility] SERVICES [address] [place], [place], [address] Phone: [phone]   Fax:  [phone]  Occupational Therapy Treatment  Patient Details  Name: Curtis Powell MRN: [id number] Date of Birth: [birth year] No data recorded  Encounter Date: 11/29/2019   OT End of Session - 11/29/19 1450    Visit Number [id number]    Number of Visits 234    Date for OT Re-Evaluation 01/10/20    Authorization Type Progress reporting period starting 09/15/2019    OT Start Time 1432    OT Stop Time 1515    OT Time Calculation (min) 43 min    Equipment Utilized During Treatment 3# weighted dowel    Activity Tolerance Patient tolerated treatment well    Behavior During Therapy [facility] for tasks assessed/performed           Past Medical History:  Diagnosis Date  . GERD (gastroesophageal reflux disease)   . Hyperlipidemia   . Hypertension   . Obesity   . Stroke [facility])     Past Surgical History:  Procedure Laterality Date  . BRAIN SURGERY      There were no vitals filed for this visit.   Subjective Assessment - 11/29/19 1447    Subjective  Pt reports wanting to strengthen his arms today. Denies pain.    Patient is accompanied by: Family member    Pertinent History Pt. is a 54 y.o. male who suffered a CVA on 05/01/2016. Pt. was admitted to the hospital. Once discharged, he received Home Health PT and OT services for about a month. Pt. has had multiple CVAs over the past 8 years, and has had multiple falls in the past 6 months. Pt. resides in an apartment. Pt. has caregivers for 80 hours. Pt.'s mother stays with pt. at night, and assists with IADL tasks.      Patient Stated Goals To be able to throw a ball and dribble a ball, do as much as I can for myself.     Currently in Pain? No/denies            OT Tx:  Therapeutic Exercises   With use of a 4# weighted dowel, pt performed there ex bicep curls 3 sets of 10, shoulder elevation 2 sets of 5,  forward circles x10, shoulder press 3 sets of 10, diagonals x10 each side, wrist ext/flex 1 set of 20 each. All ex with rest breaks in between as needed. Pt denied pain throughout, reported the exercises felt moderately challenging.                          OT Education - 11/29/19 1449    Education Details exercises, strengthening    Person(s) Educated Patient    Methods Explanation;Demonstration    Comprehension Verbalized understanding;Returned demonstration;Verbal cues required    Person(s) Educated Patient    Methods Explanation;Demonstration;Tactile cues;Verbal cues    Comprehension Verbalized understanding;Returned demonstration;Verbal cues required;Tactile cues required            OT Short Term Goals - 03/03/17 1459      OT SHORT TERM GOAL #1   Title `             OT Long Term Goals - 10/27/19 1314      OT LONG TERM GOAL #4   Title Pt. will perform self dressing with minA and A/E as needed.    Baseline Pt. continues to be able to  donn a T shirt independently with increased time. Min-modA donning a jacket, Mod A donning pants over his feet, and socks, minA with shoes.    Time 12    Period Weeks    Status Partially Met    Target Date 01/10/20      OT LONG TERM GOAL #6   Title Pt. will write his name efficiently with 100% legibility    Baseline Pt. continues to present with 75% legibility with increased time required when printing his name, and 50% legibility when cursive writing his name to sign in when riding the [place]    Time 12    Period Weeks    Status On-going    Target Date 01/10/20      OT LONG TERM GOAL #7   Title Pt will independently and consistently follow HEP to increase UE strength to increase functional independence    Baseline Pt. continues to work towards independence with exercises    Time 12    Period Weeks    Status On-going    Target Date 01/10/20      OT LONG TERM GOAL #8   Title Pt. will require supervision ironing a  shirt.    Baseline Pt. has progressed CGA, and complete set-up seated using a tabletop iron    Time 12    Period Weeks    Status On-going    Target Date 01/10/20      OT LONG TERM GOAL  #9   Baseline Pt. will independently be able to sew a button onto a shirt.    Time 12    Period Weeks    Status On-going    Target Date 01/10/20      OT LONG TERM GOAL  #10   TITLE Pt. will independently be able to throw a ball    Baseline Pt. continues to be limited with throwing overhand, however pt. is able to perform underhand throwing.    Time 12    Period Weeks    Status On-going    Target Date 01/10/20      OT LONG TERM GOAL  #11   TITLE Pt. will improve UE functional reaching to be able to independently use his RUE to hand his clothes in the closest.    Baseline Pt. continues to improve with reaching up with the RUE, however has difficulty reaching the hanger rack in the closet.    Time 12    Period Weeks    Status On-going    Target Date 01/10/20      OT LONG TERM GOAL  #13   TITLE Pt. will increase BUE strength to be able to prepare for assisting with yardwork.    Baseline Pt. BUE strength 4-/5. Pt. reports that he has been able to get out to the front.    Time 12    Period Weeks    Status On-going                 Plan - 11/29/19 1504    Clinical Impression Statement Pt denied pain throughout, responded well to exercises, reporting moderate challenge. Rest breaks needed. Pt required verbal and tactile cues throughout to maximize technique. Pt continues to benefit from skilled OT services. Continue with current OT plan of care.    OT Occupational Profile and History Problem Focused Assessment - Including review of records relating to presenting problem    Occupational performance deficits (Please refer to evaluation for details): ADL's;IADL's    Body Structure /  Function / Physical Skills ADL;FMC;ROM;IADL;Pain;Coordination    Rehab Potential Fair    Clinical Decision Making  Several treatment options, min-mod task modification necessary    Comorbidities Affecting Occupational Performance: May have comorbidities impacting occupational performance    OT Frequency 1x / week    OT Duration 12 weeks    OT Treatment/Interventions Self-care/ADL training;Patient/family education;DME and/or AE instruction;Therapeutic exercise;Neuromuscular education    Plan continue with current OT plan of care    Consulted and Agree with Plan of Care Patient           Patient will benefit from skilled therapeutic intervention in order to improve the following deficits and impairments:   Body Structure / Function / Physical Skills: ADL, FMC, ROM, IADL, Pain, Coordination       Visit Diagnosis: Muscle weakness (generalized)    Problem List Patient Active Problem List   Diagnosis Date Noted  . Acute CVA (cerebrovascular accident) ([place]) 05/02/2016  . Left-sided weakness 05/01/2016    Corky Sox, OTR/L 11/29/2019, 3:24 PM  [place] MAIN [facility] SERVICES [address] [place], [place], [address] Phone: [phone]   Fax:  [phone]  Name: Curtis Powell MRN: [id number] Date of Birth: April 16, 1965

## 2019-11-29 NOTE — Therapy (Signed)
[place] MAIN [facility] SERVICES [address] [place], [place], [address] Phone: [phone]   Fax:  [phone]  Physical Therapy Treatment  Patient Details  Name: Curtis Powell MRN: [id number] Date of Birth: 09/27/1965 No data recorded  Encounter Date: 11/29/2019   PT End of Session - 11/29/19 1352    Visit Number [id number]    Number of Visits 229    Date for PT Re-Evaluation 01/12/20    Authorization Type 6/10 PN 11/08/19    PT Start Time 1345    PT Stop Time 1429    PT Time Calculation (min) 44 min    Equipment Utilized During Treatment Gait belt    Activity Tolerance Patient tolerated treatment well;No increased pain    Behavior During Therapy WFL for tasks assessed/performed           Past Medical History:  Diagnosis Date  . GERD (gastroesophageal reflux disease)   . Hyperlipidemia   . Hypertension   . Obesity   . Stroke [facility])     Past Surgical History:  Procedure Laterality Date  . BRAIN SURGERY      There were no vitals filed for this visit.   Subjective Assessment - 11/29/19 1353    Subjective Patient reports he has been practicing his balance at home, especially when using the restroom. No falls or LOB since last session.    Pertinent History  Patient is a pleasant 54 year old male who presents to physical therapy for weakness and immobility secondary to CVA.  Had a stroke in Feb 2018. He was previously fully independent, but this stroke caused severe residual deficits, mainly on the right side as well as speech, and now he is unable to walk or perform most of his ADLs on his own. Entire right side is very weak. He still has a little difficulty with speech but his swallowing is improved to baseline. His mom had to move in with him and now is his main caregiver.     Currently in Pain? No/denies          Nustep Lvl 4 RPM> 70 for cardiovascular and muskuloskeletal challenge x 3 minutes    Ambulating with RW:     ambulate 165  ft x CGA with RW, cues for foot clearance, upright posture, tactile cueing to PSIS and ASIS for body mechanics and sequencing; Able to turn with good mechanics keeping feet inside walker, one standing rest break   Static stand w RW in front for stability: close CGA    Stand with one hand on RW other hand reaching and transferring cones from one side of table to the other for carryover to daily task performance such as iADLs in the kitchen, lateral transfer x 10 each UE, forward transfer x10 each side   Stand with UE support 4" step toe taps 10x each LE, very challenging.    Stand with shadow box, occasional Min A to return to [facility], able to perform twenty reciprocating punches with one episode of LOB  Standing reach with LUE with SUE support for small ball, toss at target for pertubations x 12 no LOB   Mini knee squats with UE support on RW 10x ; wheelchair behind    seated: rainbow ball toss for pertubation, coordination, and reaction timing Seated overhead rainbow ball hold with march 15x each LE  TrA activation 15x 3 second holds TrA activation with LE marching 12x each LE  Airplane lateral sidebends with return to  center of mass 10x each side    education and performance of pushing foot rests away and together to stand up and sit down.       Pt educated throughout session about proper posture and technique with exercises. Improved exercise technique, movement at target joints, use of target muscles after min to mod verbal, visual, tactile cues                         PT Education - 11/29/19 1352    Education provided Yes    Education Details exercise technique, body mechanics    Person(s) Educated Patient    Methods Explanation;Demonstration;Tactile cues;Verbal cues    Comprehension Verbalized understanding;Returned demonstration;Verbal cues required;Tactile cues required            PT Short Term Goals - 10/20/19 1722      PT SHORT TERM GOAL #1   Title  Patient will be independent in home exercise program to improve strength/mobility for better functional independence with ADLs.    Baseline hep compliant    Time 2    Period Weeks    Status Achieved      PT SHORT TERM GOAL #2   Title Patient will require min cueing for STS transfer with CGA for increased independence with mobility.     Baseline CGA    Time 2    Period Weeks    Status Achieved      PT SHORT TERM GOAL #3   Title Patient will maintain upright posture for > 15 seconds to demonstrate strengthened postural control muscualture     Baseline 18 seconds     Time 2    Period Weeks    Status Achieved             PT Long Term Goals - 10/20/19 0001      PT LONG TERM GOAL #1   Title Patient will perform bed mobility (rolling and supine <>sit) with Mod I  to increase mobility and  perform ADLs.     Baseline 7/14: Min A 4/21: unable to perform 6/15: improving but still min-mod A     Time 12    Period Weeks    Status Partially Met    Target Date 01/12/20      PT LONG TERM GOAL #2   Title Patient (< 54 years old) will complete five times sit to stand test in < 10 seconds indicating an increased LE strength and improved balance.    Baseline  7/14: 14.31 seconds 6/21: 16.86 seconds 4/21: 18.8 seconds 06/02/19: 19.93 seconds one hand on RW, one hand on w/c 12/30: 16 secpmds BUE support 02/11/19: 36 seconds 5/19: 20.6s from [place], one hand on walker and one on WC, 80% upright posture 7/1: 22 seconds one hand on walker one hand on w/c  9/14: 48 seconds with full upright posture    Time 12    Period Weeks    Status Partially Met    Target Date 01/12/20      PT LONG TERM GOAL #3   Title Patient will negotiate a bathroom with a walker and Min A for increased indep in home and external environment    Baseline 7/14: unable to do     Time 12    Period Weeks    Status New    Target Date 01/12/20      PT LONG TERM GOAL #4   Title Patient will increase BLE gross strength  to 4+/5 as to  improve functional strength for independent gait, increased standing tolerance and increased ADL ability.    Baseline  4/21: see note 06/02/19: hip flexion 4-/5, extension/abd 3/5 12/30: hip flexion 4/5 hip extension and abduction 3/5 02/11/19: hip flexion 4/5 hip extension and abduction 2+/54-/5 RLE, 10/15: 4/5 RLE 11/7: 4/5 RLE 3/3: 4/5 7/1:  hip flexion 4/5 hip extension 2/5 hip abduction/adduciton 2/5     Period Weeks    Status Partially Met    Target Date 01/12/20      PT LONG TERM GOAL #6   Title Patient will ambulate 200 ft with least assistive device and Supervision with no breaks to allow for increased mobility within home.    Baseline 7/14: 190 ft with RW 4/21: ambulate 150 ft 6/21: had to terminate after 90 ft due to cardiac     Time 12    Period Weeks    Status Partially Met    Target Date 01/12/20      PT LONG TERM GOAL #7   Title Patient will perform 10 MWT in <30 seconds for improved gait speed, gait mechanics, and functional capacity for mobility.     Baseline  7/14: 37.98 seconds 6/21: 46.9 seconds with RW 07/28/19: 51 seconds with RW 06/02/19: 62.5 seconds with RW 11/4: 56 seconds with AFO and RW 12/30: 53 seconds with AFO and RW     Time 12    Period Weeks    Status Partially Met    Target Date 01/12/20      PT LONG TERM GOAL #8   Title Patient will increase Berg Balance score by > 6 points (38/56) to demonstrate decreased fall risk during functional activities.    Baseline 7/14: 33/56 07/28/19: 32/56 6/21: 32/56     Time 12    Period Weeks    Status Partially Met    Target Date 01/12/20                 Plan - 11/29/19 1428    Clinical Impression Statement Patient presents with intermittent fatigue requiring occasional rest breaks throughout physical therapy session. Increased stability with decreased UE support in standing tolerated with patient having less frequent episodes of LOB. Patient does continue to require cueing for gluteal squeeze and upright posture  with prolonged standing due to preference for forward reach. Patient continues to progress with functional stability and mobility. Patient will continue to benefit from skilled therapy in order to increase strength and endurance    Rehab Potential Fair    Clinical Impairments Affecting Rehab Potential hx of HTN, HLD, CVA, learning disability, Diabetes, brain tumor,     PT Frequency 2x / week    PT Duration 12 weeks    PT Treatment/Interventions ADLs/Self Care Home Management;Aquatic Therapy;Ultrasound;Moist Heat;Traction;DME Instruction;Gait training;Stair training;Functional mobility training;Therapeutic activities;Therapeutic exercise;Orthotic Fit/Training;Neuromuscular re-education;Balance training;Patient/family education;Manual techniques;Wheelchair mobility training;Passive range of motion;Energy conservation;Taping;Visual/perceptual remediation/compensation    PT Next Visit Plan progress standing balance    PT Home Exercise Plan TrA contraction, glute sets: needs updates. Potentially some time in supine flat in hospital bed, to improve hip extension deficits.     Consulted and Agree with Plan of Care Patient           Patient will benefit from skilled therapeutic intervention in order to improve the following deficits and impairments:  Abnormal gait, Decreased activity tolerance, Decreased balance, Decreased knowledge of precautions, Decreased endurance, Decreased coordination, Decreased knowledge of use of DME, Decreased mobility, Decreased range of  motion, Difficulty walking, Decreased safety awareness, Decreased strength, Impaired flexibility, Impaired perceived functional ability, Impaired tone, Postural dysfunction, Improper body mechanics, Pain  Visit Diagnosis: Muscle weakness (generalized)  Other lack of coordination  Hemiplegia and hemiparesis following cerebral infarction affecting right dominant side (HCC)  Other abnormalities of gait and mobility     Problem  List Patient Active Problem List   Diagnosis Date Noted  . Acute CVA (cerebrovascular accident) ([place]) 05/02/2016  . Left-sided weakness 05/01/2016   Janna Arch, PT, DPT   11/29/2019, 2:29 PM  [place] MAIN [facility] SERVICES [address] [place], [place], [address] Phone: [phone]   Fax:  [phone]  Name: Curtis Powell MRN: [id number] Date of Birth: 1965-06-11

## 2019-12-01 ENCOUNTER — Encounter: Payer: Self-pay | Admitting: Occupational Therapy

## 2019-12-01 ENCOUNTER — Other Ambulatory Visit: Payer: Self-pay

## 2019-12-01 ENCOUNTER — Ambulatory Visit: Payer: Medicare HMO

## 2019-12-01 ENCOUNTER — Ambulatory Visit: Payer: Medicare HMO | Admitting: Occupational Therapy

## 2019-12-01 DIAGNOSIS — I69351 Hemiplegia and hemiparesis following cerebral infarction affecting right dominant side: Secondary | ICD-10-CM

## 2019-12-01 DIAGNOSIS — M6281 Muscle weakness (generalized): Secondary | ICD-10-CM | POA: Diagnosis not present

## 2019-12-01 DIAGNOSIS — R278 Other lack of coordination: Secondary | ICD-10-CM

## 2019-12-01 DIAGNOSIS — R2689 Other abnormalities of gait and mobility: Secondary | ICD-10-CM

## 2019-12-01 NOTE — Therapy (Addendum)
[place] MAIN [facility] SERVICES [address] [place], [place], [address] Phone: [phone]   Fax:  [phone]  Occupational Therapy Progress Note  Dates of reporting period 09/15/2019   to   12/01/2019  Patient Details  Name: Curtis Powell MRN: [id number] Date of Birth: 11-28-1965 No data recorded  Encounter Date: 12/01/2019   OT End of Session - 12/01/19 1432    Visit Number [id number]    Number of Visits 234    Date for OT Re-Evaluation 01/10/20    Authorization Type Progress reporting period starting 09/15/2019    OT Start Time 1430    OT Stop Time 1515    OT Time Calculation (min) 45 min    Activity Tolerance Patient tolerated treatment well    Behavior During Therapy [facility] for tasks assessed/performed           Past Medical History:  Diagnosis Date  . GERD (gastroesophageal reflux disease)   . Hyperlipidemia   . Hypertension   . Obesity   . Stroke [facility])     Past Surgical History:  Procedure Laterality Date  . BRAIN SURGERY      There were no vitals filed for this visit.   Subjective Assessment - 12/01/19 1431    Subjective  Pt. reports no pain.    Patient is accompanied by: Family member    Pertinent History Pt. is a 54 y.o. male who suffered a CVA on 05/01/2016. Pt. was admitted to the hospital. Once discharged, he received Home Health PT and OT services for about a month. Pt. has had multiple CVAs over the past 8 years, and has had multiple falls in the past 6 months. Pt. resides in an apartment. Pt. has caregivers for 80 hours. Pt.'s mother stays with pt. at night, and assists with IADL tasks.      Patient Stated Goals To be able to throw a ball and dribble a ball, do as much as I can for myself.     Currently in Pain? No/denies              [facility] OT Assessment - 12/01/19 0001      Coordination   Right 9 Hole Peg Test 47    Left 9 Hole Peg Test 48      Strength   Overall Strength Comments BUE strength 4/5 overall       Hand Function   Right Hand Grip (lbs) 45    Right Hand Lateral Pinch 21 lbs    Right Hand 3 Point Pinch 12 lbs    Left Hand Grip (lbs) 40    Left Hand Lateral Pinch 18 lbs    Left 3 point pinch 13 lbs            OT TREATMENT   Therapeutic Exercise:  Pt. performed4# dowel ex.for UE strengthening secondary to weakness. Bilateral shoulder flexion, chest press,andcircular patterns were performedfor 2 sets 10 reps,3# dumbbell ex. for elbow flexion and extension, forearm supination/pronation, wrist flexion/extension, and radial deviation. Pt. requires rest breaks and verbal cues for proper technique.1-2 sets 10-20 reps each.  Measurements were obtained, and goals were reviewed with the pt. Pt. reports that he is consistently doing more at home to engage his UE's. Pt. continues to work on UE functional reaching. Pt. continues tomake progress overall.Pt.is improving with right hand strength, and [facility] skills.Pt. continues to present with limited BUE strength, and [facility] skills. Pt.continues to work on improving UE strength, and [facility] skills  in order to work towards maximizing independence with ADLs, and IADL tasks.                   OT Education - 12/01/19 1432    Education provided Yes    Education Details exercises, strengthening    Person(s) Educated Patient    Methods Explanation;Demonstration    Comprehension Verbalized understanding;Returned demonstration;Verbal cues required            OT Short Term Goals - 03/03/17 1459      OT SHORT TERM GOAL #1   Title `             OT Long Term Goals - 12/01/19 1434      OT LONG TERM GOAL #4   Title Pt. will perform self dressing with minA and A/E as needed.    Baseline Pt. continues to be able to donn a T shirt independently with increased time. Min-modA donning a jacket, Mod A donning pants over his feet, and socks, minA with shoes.    Time 12    Period Weeks    Status Partially Met    Target Date  01/10/20      OT LONG TERM GOAL #6   Title Pt. will write his name efficiently with 100% legibility    Baseline Pt. continues to present with 75% legibility with increased time required when printing his name, and 50% legibility when cursive writing his name to sign in when riding the [place]    Time 12    Period Weeks    Status On-going    Target Date 01/10/20      OT LONG TERM GOAL #7   Title Pt will independently and consistently follow HEP to increase UE strength to increase functional independence    Baseline Pt. continues to work towards independence with exercises    Time 12    Status On-going    Target Date 01/10/20      OT LONG TERM GOAL #8   Title Pt. will require supervision ironing a shirt.    Baseline Pt. has progressed CGA, and complete set-up seated using a tabletop iron    Time 12    Period Weeks    Status On-going    Target Date 01/10/20      OT LONG TERM GOAL  #9   Baseline Pt. will independently be able to sew a button onto a shirt.    Time 12    Period Weeks    Status On-going    Target Date 01/10/20      OT LONG TERM GOAL  #10   TITLE Pt. will independently be able to throw a ball    Baseline Pt. continues to be limited with throwing overhand, however pt. is able to perform underhand throwing.    Time 12    Status On-going    Target Date 01/10/20      OT LONG TERM GOAL  #11   TITLE Pt. will improve UE functional reaching to be able to independently use his RUE to hand his clothes in the closest.    Baseline Pt. continues to improve with reaching up with the RUE, however has difficulty reaching the hanger rack in the closet.    Time 12    Period Weeks    Target Date 01/10/20      OT LONG TERM GOAL  #13   TITLE Pt. will increase BUE strength to be able to prepare for assisting with yardwork.  Baseline Pt. BUE strength 4/5. Pt. reports that he has been able to get out to the front.    Time 12    Period Weeks    Status On-going                  Plan - 12/01/19 1433    Clinical Impression Statement Measurements were obtained, and goals were reviewed with the pt. Pt. reports that he is consistently doing more at home to engage his UE's. Pt. continues to work on UE functional reaching. Pt. continues tomake progress overall.Pt.is improving with right hand strength, and [facility] skills.Pt. continues to present with limited BUE strength, and [facility] skills. Pt.continues to work on improving UE strength, and [facility] skills in order to work towards maximizing independence with ADLs, and IADL tasks.   OT Occupational Profile and History Problem Focused Assessment - Including review of records relating to presenting problem    Occupational performance deficits (Please refer to evaluation for details): ADL's;IADL's    Body Structure / Function / Physical Skills ADL;FMC;ROM;IADL;Pain;Coordination    Rehab Potential Fair    Clinical Decision Making Several treatment options, min-mod task modification necessary    Comorbidities Affecting Occupational Performance: May have comorbidities impacting occupational performance    Modification or Assistance to Complete Evaluation  Min-Moderate modification of tasks or assist with assess necessary to complete eval    OT Frequency 2x / week    OT Duration 12 weeks    OT Treatment/Interventions Self-care/ADL training;Patient/family education;DME and/or AE instruction;Therapeutic exercise;Neuromuscular education    Consulted and Agree with Plan of Care Patient           Patient will benefit from skilled therapeutic intervention in order to improve the following deficits and impairments:   Body Structure / Function / Physical Skills: ADL, FMC, ROM, IADL, Pain, Coordination       Visit Diagnosis: Muscle weakness (generalized)    Problem List Patient Active Problem List   Diagnosis Date Noted  . Acute CVA (cerebrovascular accident) ([place]) 05/02/2016  . Left-sided weakness 05/01/2016     Harrel Carina, MS, OTR/L 12/01/2019, 2:55 PM  [place] MAIN [facility] SERVICES [address] [place], [place], [address] Phone: [phone]   Fax:  [phone]  Name: Curtis Powell MRN: [id number] Date of Birth: [birth year]

## 2019-12-01 NOTE — Therapy (Signed)
[place] MAIN [facility] SERVICES [address] [place], [place], [address] Phone: [phone]   Fax:  [phone]  Physical Therapy Treatment  Patient Details  Name: Curtis Powell MRN: [id number] Date of Birth: 1966/03/23 No data recorded  Encounter Date: 12/01/2019   PT End of Session - 12/01/19 1354    Visit Number [id number]    Number of Visits 229    Date for PT Re-Evaluation 01/12/20    Authorization Type 7/10 PN 11/08/19    PT Start Time 1345    PT Stop Time 1429    PT Time Calculation (min) 44 min    Equipment Utilized During Treatment Gait belt    Activity Tolerance Patient tolerated treatment well;No increased pain    Behavior During Therapy WFL for tasks assessed/performed           Past Medical History:  Diagnosis Date  . GERD (gastroesophageal reflux disease)   . Hyperlipidemia   . Hypertension   . Obesity   . Stroke [facility])     Past Surgical History:  Procedure Laterality Date  . BRAIN SURGERY      There were no vitals filed for this visit.   Subjective Assessment - 12/01/19 1353    Subjective Patient reports he leaves for his trip tomorrow to [place]. Is very very excited.    Pertinent History  Patient is a pleasant 54 year old male who presents to physical therapy for weakness and immobility secondary to CVA.  Had a stroke in Feb 2018. He was previously fully independent, but this stroke caused severe residual deficits, mainly on the right side as well as speech, and now he is unable to walk or perform most of his ADLs on his own. Entire right side is very weak. He still has a little difficulty with speech but his swallowing is improved to baseline. His mom had to move in with him and now is his main caregiver.     Limitations Sitting;Lifting;Standing;Walking;House hold activities    Patient Stated Goals Walk without walker, walk further with walker, get some strength in back     Currently in Pain?  No/denies                      Nustep Lvl 4 RPM> 70 for cardiovascular and musculoskeletal challenge x 3 minutes     Ambulating with RW:     ambulate 95 ft x CGA with RW, cues for foot clearance, upright posture, tactile cueing to PSIS and ASIS for body mechanics and sequencing; Able to turn with good mechanics keeping feet inside walker, one standing rest break    Static stand w RW in front for stability: close CGA    Stand with one hand on RW other hand reaching and transferring cones from one side of table to the other for carryover to daily task performance such as iADLs in the kitchen, lateral transfer x 10 each UE, forward transfer x10 each side   Stand with UE support 4" step toe taps 10x each LE, very challenging.    Stand without UE support bilateral UE raises 10x , 2 near LOB requiring min A to return to COM   Standing reach with LUE with SUE support for small ball, toss at target for pertubations x 12 no LOB   Mini knee squats with UE support on RW 10x ; wheelchair behind    seated: TrA activation 15x 3 second holds  TrA activation with LE marching 12x each LE  RTB around ankles, alternating LAQ 15x each LE RTB around ankles, green ball between knees 15x IR/ER each LE  Gluteal squeeze 10x 3 seconds    education and performance of pushing foot rests away and together to stand up and sit down.       Pt educated throughout session about proper posture and technique with exercises. Improved exercise technique, movement at target joints, use of target muscles after min to mod verbal, visual, tactile cues                PT Education - 12/01/19 1354    Education provided Yes    Education Details exercise technique, body mechanics    Person(s) Educated Patient    Methods Explanation;Demonstration;Tactile cues;Verbal cues    Comprehension Verbalized understanding;Returned demonstration;Verbal cues required;Tactile cues required            PT Short  Term Goals - 10/20/19 1722      PT SHORT TERM GOAL #1   Title Patient will be independent in home exercise program to improve strength/mobility for better functional independence with ADLs.    Baseline hep compliant    Time 2    Period Weeks    Status Achieved      PT SHORT TERM GOAL #2   Title Patient will require min cueing for STS transfer with CGA for increased independence with mobility.     Baseline CGA    Time 2    Period Weeks    Status Achieved      PT SHORT TERM GOAL #3   Title Patient will maintain upright posture for > 15 seconds to demonstrate strengthened postural control muscualture     Baseline 18 seconds     Time 2    Period Weeks    Status Achieved             PT Long Term Goals - 10/20/19 0001      PT LONG TERM GOAL #1   Title Patient will perform bed mobility (rolling and supine <>sit) with Mod I  to increase mobility and  perform ADLs.     Baseline 7/14: Min A 4/21: unable to perform 6/15: improving but still min-mod A     Time 12    Period Weeks    Status Partially Met    Target Date 01/12/20      PT LONG TERM GOAL #2   Title Patient (< 74 years old) will complete five times sit to stand test in < 10 seconds indicating an increased LE strength and improved balance.    Baseline  7/14: 14.31 seconds 6/21: 16.86 seconds 4/21: 18.8 seconds 06/02/19: 19.93 seconds one hand on RW, one hand on w/c 12/30: 16 secpmds BUE support 02/11/19: 36 seconds 5/19: 20.6s from [place], one hand on walker and one on WC, 80% upright posture 7/1: 22 seconds one hand on walker one hand on w/c  9/14: 48 seconds with full upright posture    Time 12    Period Weeks    Status Partially Met    Target Date 01/12/20      PT LONG TERM GOAL #3   Title Patient will negotiate a bathroom with a walker and Min A for increased indep in home and external environment    Baseline 7/14: unable to do     Time 12    Period Weeks    Status New    Target Date 01/12/20  PT LONG TERM GOAL #4    Title Patient will increase BLE gross strength to 4+/5 as to improve functional strength for independent gait, increased standing tolerance and increased ADL ability.    Baseline  4/21: see note 06/02/19: hip flexion 4-/5, extension/abd 3/5 12/30: hip flexion 4/5 hip extension and abduction 3/5 02/11/19: hip flexion 4/5 hip extension and abduction 2+/54-/5 RLE, 10/15: 4/5 RLE 11/7: 4/5 RLE 3/3: 4/5 7/1:  hip flexion 4/5 hip extension 2/5 hip abduction/adduciton 2/5     Period Weeks    Status Partially Met    Target Date 01/12/20      PT LONG TERM GOAL #6   Title Patient will ambulate 200 ft with least assistive device and Supervision with no breaks to allow for increased mobility within home.    Baseline 7/14: 190 ft with RW 4/21: ambulate 150 ft 6/21: had to terminate after 90 ft due to cardiac     Time 12    Period Weeks    Status Partially Met    Target Date 01/12/20      PT LONG TERM GOAL #7   Title Patient will perform 10 MWT in <30 seconds for improved gait speed, gait mechanics, and functional capacity for mobility.     Baseline  7/14: 37.98 seconds 6/21: 46.9 seconds with RW 07/28/19: 51 seconds with RW 06/02/19: 62.5 seconds with RW 11/4: 56 seconds with AFO and RW 12/30: 53 seconds with AFO and RW     Time 12    Period Weeks    Status Partially Met    Target Date 01/12/20      PT LONG TERM GOAL #8   Title Patient will increase Berg Balance score by > 6 points (38/56) to demonstrate decreased fall risk during functional activities.    Baseline 7/14: 33/56 07/28/19: 32/56 6/21: 32/56     Time 12    Period Weeks    Status Partially Met    Target Date 01/12/20                 Plan - 12/01/19 1416    Clinical Impression Statement Patient continues to remain highly motivated and is extremely excited for his upcoming trip as it is his first time away from home in >10 years. Patient is increasing in stabilization in standing with decreased need for UE support. Improved  standing posture results in decreased episodes of LOB and increased progression of movement without support. Patient will continue to benefit from skilled therapy in order to increase strength and endurance    Rehab Potential Fair    Clinical Impairments Affecting Rehab Potential hx of HTN, HLD, CVA, learning disability, Diabetes, brain tumor,     PT Frequency 2x / week    PT Duration 12 weeks    PT Treatment/Interventions ADLs/Self Care Home Management;Aquatic Therapy;Ultrasound;Moist Heat;Traction;DME Instruction;Gait training;Stair training;Functional mobility training;Therapeutic activities;Therapeutic exercise;Orthotic Fit/Training;Neuromuscular re-education;Balance training;Patient/family education;Manual techniques;Wheelchair mobility training;Passive range of motion;Energy conservation;Taping;Visual/perceptual remediation/compensation    PT Next Visit Plan progress standing balance    PT Home Exercise Plan TrA contraction, glute sets: needs updates. Potentially some time in supine flat in hospital bed, to improve hip extension deficits.     Consulted and Agree with Plan of Care Patient           Patient will benefit from skilled therapeutic intervention in order to improve the following deficits and impairments:  Abnormal gait, Decreased activity tolerance, Decreased balance, Decreased knowledge of precautions, Decreased endurance, Decreased coordination, Decreased knowledge  of use of DME, Decreased mobility, Decreased range of motion, Difficulty walking, Decreased safety awareness, Decreased strength, Impaired flexibility, Impaired perceived functional ability, Impaired tone, Postural dysfunction, Improper body mechanics, Pain  Visit Diagnosis: Muscle weakness (generalized)  Other lack of coordination  Hemiplegia and hemiparesis following cerebral infarction affecting right dominant side (HCC)  Other abnormalities of gait and mobility     Problem List Patient Active Problem List    Diagnosis Date Noted  . Acute CVA (cerebrovascular accident) ([place]) 05/02/2016  . Left-sided weakness 05/01/2016   Janna Arch, PT, DPT   12/01/2019, 2:31 PM  [place] MAIN [facility] SERVICES [address] [place], [place], [address] Phone: [phone]   Fax:  [phone]  Name: Curtis Powell MRN: [id number] Date of Birth: December 01, 1965

## 2019-12-06 ENCOUNTER — Encounter: Payer: Self-pay | Admitting: Occupational Therapy

## 2019-12-06 ENCOUNTER — Other Ambulatory Visit: Payer: Self-pay

## 2019-12-06 ENCOUNTER — Ambulatory Visit: Payer: Medicare HMO | Admitting: Occupational Therapy

## 2019-12-06 ENCOUNTER — Ambulatory Visit: Payer: Medicare HMO

## 2019-12-06 DIAGNOSIS — M6281 Muscle weakness (generalized): Secondary | ICD-10-CM

## 2019-12-06 DIAGNOSIS — R2689 Other abnormalities of gait and mobility: Secondary | ICD-10-CM

## 2019-12-06 DIAGNOSIS — R278 Other lack of coordination: Secondary | ICD-10-CM

## 2019-12-06 DIAGNOSIS — I69351 Hemiplegia and hemiparesis following cerebral infarction affecting right dominant side: Secondary | ICD-10-CM

## 2019-12-06 NOTE — Therapy (Addendum)
[place] MAIN [facility] SERVICES [address] [place], [place], [address] Phone: [phone]   Fax:  [phone]  Occupational Therapy Treatment  Patient Details  Name: Curtis Powell MRN: [id number] Date of Birth: [birth year] No data recorded  Encounter Date: 12/06/2019   OT End of Session - 12/06/19 1748    Visit Number [id number]    Number of Visits 234    Date for OT Re-Evaluation 01/10/20    Authorization Type Progress reporting period starting 09/15/2019    OT Start Time 1435    OT Stop Time 1515    OT Time Calculation (min) 40 min    Activity Tolerance Patient tolerated treatment well    Behavior During Therapy [facility] for tasks assessed/performed           Past Medical History:  Diagnosis Date  . GERD (gastroesophageal reflux disease)   . Hyperlipidemia   . Hypertension   . Obesity   . Stroke [facility])     Past Surgical History:  Procedure Laterality Date  . BRAIN SURGERY      There were no vitals filed for this visit.   Subjective Assessment - 12/06/19 1747    Subjective  Pt. reports having a nice time in [place]    Patient is accompanied by: Family member    Pertinent History Pt. is a 54 y.o. male who suffered a CVA on 05/01/2016. Pt. was admitted to the hospital. Once discharged, he received Home Health PT and OT services for about a month. Pt. has had multiple CVAs over the past 8 years, and has had multiple falls in the past 6 months. Pt. resides in an apartment. Pt. has caregivers for 80 hours. Pt.'s mother stays with pt. at night, and assists with IADL tasks.      Patient Stated Goals To be able to throw a ball and dribble a ball, do as much as I can for myself.     Currently in Pain? No/denies          OT TREATMENT    Therapeutic Exercise: Pt. performed 3.5# dowel ex. For UE strengthening secondary to weakness. Bilateral shoulder flexion, chest press, and circular patterns 1 set 10 reps each.   Selfcare:  Pt.  Performed IADL Kitchen/meal preparation tasks cooking a grilled sandwich in standing, and washing the dishes afterwards. Pt. was able to stand and complete the task with CGA, and multiple  Seated rest breaks. Pt. Required set-up of items however was able to sequence the steps of the task independently.   Pt. travelled to [place] this past weekend for a concert series. Pt. is making progress, and continues to try to perform more tasks at home. Pt. required CGA to complete a standing light cooking task at the stovetop, and washing dishes. Pt. Required seated rest breaks. Pt. Requires assist to maneuver the w/c in the kitchen. Pt. Continues to work on improving standing IADL functioning in order to work towards maximizing independence with ADLs, and IADLs.                         OT Education - 12/06/19 1748    Education provided Yes    Education Details exercises, strengthening    Person(s) Educated Patient    Methods Explanation;Demonstration    Comprehension Verbalized understanding;Returned demonstration;Verbal cues required            OT Short Term Goals - 03/03/17 1459  OT SHORT TERM GOAL #1   Title `             OT Long Term Goals - 12/01/19 1434      OT LONG TERM GOAL #4   Title Pt. will perform self dressing with minA and A/E as needed.    Baseline Pt. continues to be able to donn a T shirt independently with increased time. Min-modA donning a jacket, Mod A donning pants over his feet, and socks, minA with shoes.    Time 12    Period Weeks    Status Partially Met    Target Date 01/10/20      OT LONG TERM GOAL #6   Title Pt. will write his name efficiently with 100% legibility    Baseline Pt. continues to present with 75% legibility with increased time required when printing his name, and 50% legibility when cursive writing his name to sign in when riding the [place]    Time 12    Period Weeks    Status On-going    Target Date 01/10/20       OT LONG TERM GOAL #7   Title Pt will independently and consistently follow HEP to increase UE strength to increase functional independence    Baseline Pt. continues to work towards independence with exercises    Time 12    Status On-going    Target Date 01/10/20      OT LONG TERM GOAL #8   Title Pt. will require supervision ironing a shirt.    Baseline Pt. has progressed CGA, and complete set-up seated using a tabletop iron    Time 12    Period Weeks    Status On-going    Target Date 01/10/20      OT LONG TERM GOAL  #9   Baseline Pt. will independently be able to sew a button onto a shirt.    Time 12    Period Weeks    Status On-going    Target Date 01/10/20      OT LONG TERM GOAL  #10   TITLE Pt. will independently be able to throw a ball    Baseline Pt. continues to be limited with throwing overhand, however pt. is able to perform underhand throwing.    Time 12    Status On-going    Target Date 01/10/20      OT LONG TERM GOAL  #11   TITLE Pt. will improve UE functional reaching to be able to independently use his RUE to hand his clothes in the closest.    Baseline Pt. continues to improve with reaching up with the RUE, however has difficulty reaching the hanger rack in the closet.    Time 12    Period Weeks    Target Date 01/10/20      OT LONG TERM GOAL  #13   TITLE Pt. will increase BUE strength to be able to prepare for assisting with yardwork.    Baseline Pt. BUE strength 4/5. Pt. reports that he has been able to get out to the front.    Time 12    Period Weeks    Status On-going                 Plan - 12/06/19 1749    Clinical Impression Statement Pt. travelled to [place] this past weekend for a concert series. Pt. is making progress, and continues to try to perform more tasks at home. Pt. required CGA to  complete a standing light cooking task at the stovetop, and washing dishes. Pt. Required seated rest breaks. Pt. Requires assist to maneuver the w/c  in the kitchen. Pt. Continues to work on improving standing IADL functioning in order to work towards maximizing independence with ADLs, and IADLs.   OT Occupational Profile and History Problem Focused Assessment - Including review of records relating to presenting problem    Occupational performance deficits (Please refer to evaluation for details): ADL's;IADL's    Body Structure / Function / Physical Skills ADL;FMC;ROM;IADL;Pain;Coordination    Rehab Potential Fair    Clinical Decision Making Several treatment options, min-mod task modification necessary    Comorbidities Affecting Occupational Performance: May have comorbidities impacting occupational performance    Modification or Assistance to Complete Evaluation  Min-Moderate modification of tasks or assist with assess necessary to complete eval    OT Frequency 2x / week    OT Duration 12 weeks    OT Treatment/Interventions Self-care/ADL training;Patient/family education;DME and/or AE instruction;Therapeutic exercise;Neuromuscular education    Consulted and Agree with Plan of Care Patient           Patient will benefit from skilled therapeutic intervention in order to improve the following deficits and impairments:   Body Structure / Function / Physical Skills: ADL, FMC, ROM, IADL, Pain, Coordination       Visit Diagnosis: Muscle weakness (generalized)  Other lack of coordination    Problem List Patient Active Problem List   Diagnosis Date Noted  . Acute CVA (cerebrovascular accident) ([place]) 05/02/2016  . Left-sided weakness 05/01/2016    Harrel Carina, MS, OTR/L 12/06/2019, 5:51 PM  [place] MAIN [facility] SERVICES [address] [place], [place], [address] Phone: [phone]   Fax:  [phone]  Name: Curtis Powell MRN: [id number] Date of Birth: 29-Dec-1965

## 2019-12-06 NOTE — Therapy (Signed)
[place] MAIN [facility] SERVICES [address] [place], [place], [address] Phone: [phone]   Fax:  [phone]  Physical Therapy Treatment  Patient Details  Name: Curtis Powell MRN: [id number] Date of Birth: 01/27/1966 No data recorded  Encounter Date: 12/06/2019   PT End of Session - 12/06/19 1359    Visit Number [id number]    Number of Visits 229    Date for PT Re-Evaluation 01/12/20    Authorization Type 8/10 PN 11/08/19    PT Start Time 1345    PT Stop Time 1429    PT Time Calculation (min) 44 min    Equipment Utilized During Treatment Gait belt    Activity Tolerance Patient tolerated treatment well;No increased pain    Behavior During Therapy WFL for tasks assessed/performed           Past Medical History:  Diagnosis Date  . GERD (gastroesophageal reflux disease)   . Hyperlipidemia   . Hypertension   . Obesity   . Stroke [facility])     Past Surgical History:  Procedure Laterality Date  . BRAIN SURGERY      There were no vitals filed for this visit.   Subjective Assessment - 12/06/19 1357    Subjective Patient reports he was able to go to the [employer] over the weekend, had a hard time though because the handicap bathroom had a very low toilet. Wasn't able to listen to the music for a long time.    Pertinent History  Patient is a pleasant 54 year old male who presents to physical therapy for weakness and immobility secondary to CVA.  Had a stroke in Feb 2018. He was previously fully independent, but this stroke caused severe residual deficits, mainly on the right side as well as speech, and now he is unable to walk or perform most of his ADLs on his own. Entire right side is very weak. He still has a little difficulty with speech but his swallowing is improved to baseline. His mom had to move in with him and now is his main caregiver.     Limitations Sitting;Lifting;Standing;Walking;House hold activities    Patient Stated Goals Walk without  walker, walk further with walker, get some strength in back     Currently in Pain? No/denies              Nustep Lvl 4 RPM> 70 for cardiovascular and musculoskeletal challenge x 3 minutes     Ambulating with RW:     ambulate 155 ft x CGA with RW, cues for foot clearance, upright posture, tactile cueing to PSIS and ASIS for body mechanics and sequencing; Able to turn with good mechanics keeping feet inside walker, one seated rest break    Static stand w RW in front for stability: close CGA    Stand without UE support bilateral UE raises 10x , 2 near LOB requiring min A to return to [facility]      Mini knee squats with UE support on RW 10x ; wheelchair behind with close CGA     seated: TrA activation 15x 3 second holds TrA activation with LE marching 12x each LE  RTB around ankles, alternating LAQ 15x each LE RTB around ankles, green ball between knees 15x IR/ER each LE  Gluteal squeeze 10x 3 seconds    toileting with assistance for negotiation through door frame    education and performance of pushing foot rests away and together to stand up and sit down.  Pt educated throughout session about proper posture and technique with exercises. Improved exercise technique, movement at target joints, use of target muscles after min to mod verbal, visual, tactile cues                           PT Education - 12/06/19 1358    Education provided Yes    Education Details exercise technique, body mechanics    Person(s) Educated Patient    Methods Explanation;Demonstration;Tactile cues;Verbal cues    Comprehension Verbalized understanding;Verbal cues required;Tactile cues required;Returned demonstration            PT Short Term Goals - 10/20/19 1722      PT SHORT TERM GOAL #1   Title Patient will be independent in home exercise program to improve strength/mobility for better functional independence with ADLs.    Baseline hep compliant    Time 2    Period Weeks     Status Achieved      PT SHORT TERM GOAL #2   Title Patient will require min cueing for STS transfer with CGA for increased independence with mobility.     Baseline CGA    Time 2    Period Weeks    Status Achieved      PT SHORT TERM GOAL #3   Title Patient will maintain upright posture for > 15 seconds to demonstrate strengthened postural control muscualture     Baseline 18 seconds     Time 2    Period Weeks    Status Achieved             PT Long Term Goals - 10/20/19 0001      PT LONG TERM GOAL #1   Title Patient will perform bed mobility (rolling and supine <>sit) with Mod I  to increase mobility and  perform ADLs.     Baseline 7/14: Min A 4/21: unable to perform 6/15: improving but still min-mod A     Time 12    Period Weeks    Status Partially Met    Target Date 01/12/20      PT LONG TERM GOAL #2   Title Patient (< 37 years old) will complete five times sit to stand test in < 10 seconds indicating an increased LE strength and improved balance.    Baseline  7/14: 14.31 seconds 6/21: 16.86 seconds 4/21: 18.8 seconds 06/02/19: 19.93 seconds one hand on RW, one hand on w/c 12/30: 16 secpmds BUE support 02/11/19: 36 seconds 5/19: 20.6s from [place], one hand on walker and one on WC, 80% upright posture 7/1: 22 seconds one hand on walker one hand on w/c  9/14: 48 seconds with full upright posture    Time 12    Period Weeks    Status Partially Met    Target Date 01/12/20      PT LONG TERM GOAL #3   Title Patient will negotiate a bathroom with a walker and Min A for increased indep in home and external environment    Baseline 7/14: unable to do     Time 12    Period Weeks    Status New    Target Date 01/12/20      PT LONG TERM GOAL #4   Title Patient will increase BLE gross strength to 4+/5 as to improve functional strength for independent gait, increased standing tolerance and increased ADL ability.    Baseline  4/21: see note 06/02/19: hip flexion 4-/5,  extension/abd 3/5  12/30: hip flexion 4/5 hip extension and abduction 3/5 02/11/19: hip flexion 4/5 hip extension and abduction 2+/54-/5 RLE, 10/15: 4/5 RLE 11/7: 4/5 RLE 3/3: 4/5 7/1:  hip flexion 4/5 hip extension 2/5 hip abduction/adduciton 2/5     Period Weeks    Status Partially Met    Target Date 01/12/20      PT LONG TERM GOAL #6   Title Patient will ambulate 200 ft with least assistive device and Supervision with no breaks to allow for increased mobility within home.    Baseline 7/14: 190 ft with RW 4/21: ambulate 150 ft 6/21: had to terminate after 90 ft due to cardiac     Time 12    Period Weeks    Status Partially Met    Target Date 01/12/20      PT LONG TERM GOAL #7   Title Patient will perform 10 MWT in <30 seconds for improved gait speed, gait mechanics, and functional capacity for mobility.     Baseline  7/14: 37.98 seconds 6/21: 46.9 seconds with RW 07/28/19: 51 seconds with RW 06/02/19: 62.5 seconds with RW 11/4: 56 seconds with AFO and RW 12/30: 53 seconds with AFO and RW     Time 12    Period Weeks    Status Partially Met    Target Date 01/12/20      PT LONG TERM GOAL #8   Title Patient will increase Berg Balance score by > 6 points (38/56) to demonstrate decreased fall risk during functional activities.    Baseline 7/14: 33/56 07/28/19: 32/56 6/21: 32/56     Time 12    Period Weeks    Status Partially Met    Target Date 01/12/20                 Plan - 12/06/19 1420    Clinical Impression Statement Patient is fatigued this session from recent trip requiring increased seated rest breaks. Continues to be motivated despite fatigue and is happy about his vacation. Improved control of seated interventions noted with decreased tremors. Patient will continue to benefit from skilled therapy in order to increase strength and endurance    Rehab Potential Fair    Clinical Impairments Affecting Rehab Potential hx of HTN, HLD, CVA, learning disability, Diabetes, brain tumor,     PT Frequency  2x / week    PT Duration 12 weeks    PT Treatment/Interventions ADLs/Self Care Home Management;Aquatic Therapy;Ultrasound;Moist Heat;Traction;DME Instruction;Gait training;Stair training;Functional mobility training;Therapeutic activities;Therapeutic exercise;Orthotic Fit/Training;Neuromuscular re-education;Balance training;Patient/family education;Manual techniques;Wheelchair mobility training;Passive range of motion;Energy conservation;Taping;Visual/perceptual remediation/compensation    PT Next Visit Plan progress standing balance    PT Home Exercise Plan TrA contraction, glute sets: needs updates. Potentially some time in supine flat in hospital bed, to improve hip extension deficits.     Consulted and Agree with Plan of Care Patient           Patient will benefit from skilled therapeutic intervention in order to improve the following deficits and impairments:  Abnormal gait, Decreased activity tolerance, Decreased balance, Decreased knowledge of precautions, Decreased endurance, Decreased coordination, Decreased knowledge of use of DME, Decreased mobility, Decreased range of motion, Difficulty walking, Decreased safety awareness, Decreased strength, Impaired flexibility, Impaired perceived functional ability, Impaired tone, Postural dysfunction, Improper body mechanics, Pain  Visit Diagnosis: Muscle weakness (generalized)  Other lack of coordination  Hemiplegia and hemiparesis following cerebral infarction affecting right dominant side (HCC)  Other abnormalities of gait and mobility  Problem List Patient Active Problem List   Diagnosis Date Noted  . Acute CVA (cerebrovascular accident) ([place]) 05/02/2016  . Left-sided weakness 05/01/2016   Janna Arch, PT, DPT   12/06/2019, 2:31 PM  [place] MAIN [facility] SERVICES [address] [place], [place], [address] Phone: [phone]   Fax:  [phone]  Name: Curtis Powell MRN:  [id number] Date of Birth: Mar 16, 1966

## 2019-12-08 ENCOUNTER — Ambulatory Visit: Payer: Medicare HMO | Admitting: Occupational Therapy

## 2019-12-08 ENCOUNTER — Ambulatory Visit: Payer: Medicare HMO | Attending: Family Medicine

## 2019-12-08 ENCOUNTER — Encounter: Payer: Self-pay | Admitting: Occupational Therapy

## 2019-12-08 ENCOUNTER — Other Ambulatory Visit: Payer: Self-pay

## 2019-12-08 DIAGNOSIS — R278 Other lack of coordination: Secondary | ICD-10-CM | POA: Diagnosis present

## 2019-12-08 DIAGNOSIS — M6281 Muscle weakness (generalized): Secondary | ICD-10-CM | POA: Insufficient documentation

## 2019-12-08 DIAGNOSIS — I69351 Hemiplegia and hemiparesis following cerebral infarction affecting right dominant side: Secondary | ICD-10-CM | POA: Diagnosis present

## 2019-12-08 DIAGNOSIS — R2689 Other abnormalities of gait and mobility: Secondary | ICD-10-CM | POA: Insufficient documentation

## 2019-12-08 NOTE — Therapy (Addendum)
[place] MAIN [facility] SERVICES [address] [place], [place], [address] Phone: [phone]   Fax:  [phone]  Occupational Therapy Treatment  Patient Details  Name: Curtis Powell MRN: [id number] Date of Birth: 01-Mar-1966 No data recorded  Encounter Date: 12/08/2019   OT End of Session - 12/08/19 1535    Visit Number [id number]    Number of Visits 234    Date for OT Re-Evaluation 01/10/20    Authorization Type Progress reporting period starting 09/15/2019    OT Start Time 1435    OT Stop Time 1515    OT Time Calculation (min) 40 min    Activity Tolerance Patient tolerated treatment well    Behavior During Therapy [facility] for tasks assessed/performed           Past Medical History:  Diagnosis Date  . GERD (gastroesophageal reflux disease)   . Hyperlipidemia   . Hypertension   . Obesity   . Stroke [facility])     Past Surgical History:  Procedure Laterality Date  . BRAIN SURGERY      There were no vitals filed for this visit.   Subjective Assessment - 12/08/19 1444    Subjective  Pt. reports feeling good today.    Patient is accompanied by: Family member    Pertinent History Pt. is a 54 y.o. male who suffered a CVA on 05/01/2016. Pt. was admitted to the hospital. Once discharged, he received Home Health PT and OT services for about a month. Pt. has had multiple CVAs over the past 8 years, and has had multiple falls in the past 6 months. Pt. resides in an apartment. Pt. has caregivers for 80 hours. Pt.'s mother stays with pt. at night, and assists with IADL tasks.      Patient Stated Goals To be able to throw a ball and dribble a ball, do as much as I can for myself.     Currently in Pain? No/denies    Pain Score 0-No pain          OT TREATMENT   Therapeutic Exercise:  Pt. performed4# dowel ex.for UE strengthening secondary to weakness. Bilateral shoulder flexion, chest press,andcircular patterns were performedfor 2 sets 10-20  reps,3# dumbbell ex. for elbow flexion and extension, forearm supination/pronation, wrist flexion/extension, and radial deviation. Pt. requires rest breaks and verbal cues for proper technique.1 sets 10-20 reps each.  Pt. reports that he is consistently doing more at home at home to improve strength, and functioning. Pt. continues to work on [place] functional reaching.Pt. continues tomake progress overall.Pt. is improving with LUE strength, and overall [place] skills. Pt.requires verbal, and tactile cues for there. Ex. Technique.Pt. continues to present with limited BUE strength, and [facility] skills. Pt.continues to work on improving UE strength, and [facility] skills in order to work towards maximizing independence with ADLs, and IADLtasks.                         OT Education - 12/08/19 1448    Education provided Yes    Education Details exercises, strengthening    Person(s) Educated Patient    Methods Explanation;Demonstration    Comprehension Verbalized understanding;Returned demonstration;Verbal cues required            OT Short Term Goals - 03/03/17 1459      OT SHORT TERM GOAL #1   Title `             OT Long Term Goals -  12/01/19 1434      OT LONG TERM GOAL #4   Title Pt. will perform self dressing with minA and A/E as needed.    Baseline Pt. continues to be able to donn a T shirt independently with increased time. Min-modA donning a jacket, Mod A donning pants over his feet, and socks, minA with shoes.    Time 12    Period Weeks    Status Partially Met    Target Date 01/10/20      OT LONG TERM GOAL #6   Title Pt. will write his name efficiently with 100% legibility    Baseline Pt. continues to present with 75% legibility with increased time required when printing his name, and 50% legibility when cursive writing his name to sign in when riding the [place]    Time 12    Period Weeks    Status On-going    Target Date 01/10/20      OT LONG TERM GOAL #7    Title Pt will independently and consistently follow HEP to increase UE strength to increase functional independence    Baseline Pt. continues to work towards independence with exercises    Time 12    Status On-going    Target Date 01/10/20      OT LONG TERM GOAL #8   Title Pt. will require supervision ironing a shirt.    Baseline Pt. has progressed CGA, and complete set-up seated using a tabletop iron    Time 12    Period Weeks    Status On-going    Target Date 01/10/20      OT LONG TERM GOAL  #9   Baseline Pt. will independently be able to sew a button onto a shirt.    Time 12    Period Weeks    Status On-going    Target Date 01/10/20      OT LONG TERM GOAL  #10   TITLE Pt. will independently be able to throw a ball    Baseline Pt. continues to be limited with throwing overhand, however pt. is able to perform underhand throwing.    Time 12    Status On-going    Target Date 01/10/20      OT LONG TERM GOAL  #11   TITLE Pt. will improve UE functional reaching to be able to independently use his RUE to hand his clothes in the closest.    Baseline Pt. continues to improve with reaching up with the RUE, however has difficulty reaching the hanger rack in the closet.    Time 12    Period Weeks    Target Date 01/10/20      OT LONG TERM GOAL  #13   TITLE Pt. will increase BUE strength to be able to prepare for assisting with yardwork.    Baseline Pt. BUE strength 4/5. Pt. reports that he has been able to get out to the front.    Time 12    Period Weeks    Status On-going                 Plan - 12/08/19 1451    Clinical Impression Statement Pt. reports that he is consistently doing more at home at home to improve strength, and functioning. Pt. continues to work on [place] functional reaching.Pt. continues tomake progress overall.Pt. is improving with LUE strength, and overall [place] skills. Pt.requires verbal, and tactile cues for there. Ex. Technique.Pt. continues to present  with limited BUE strength, and  [facility] skills. Pt.continues to work on improving UE strength, and [facility] skills in order to work towards maximizing independence with ADLs, and IADLtasks.   OT Occupational Profile and History Problem Focused Assessment - Including review of records relating to presenting problem    Occupational performance deficits (Please refer to evaluation for details): ADL's;IADL's    Body Structure / Function / Physical Skills ADL;FMC;ROM;IADL;Pain;Coordination    Rehab Potential Fair    Clinical Decision Making Several treatment options, min-mod task modification necessary    Comorbidities Affecting Occupational Performance: May have comorbidities impacting occupational performance    Modification or Assistance to Complete Evaluation  Min-Moderate modification of tasks or assist with assess necessary to complete eval    OT Frequency 2x / week    OT Duration 12 weeks    OT Treatment/Interventions Self-care/ADL training;Patient/family education;DME and/or AE instruction;Therapeutic exercise;Neuromuscular education    Consulted and Agree with Plan of Care Patient           Patient will benefit from skilled therapeutic intervention in order to improve the following deficits and impairments:   Body Structure / Function / Physical Skills: ADL, FMC, ROM, IADL, Pain, Coordination       Visit Diagnosis: Muscle weakness (generalized)  Other lack of coordination    Problem List Patient Active Problem List   Diagnosis Date Noted  . Acute CVA (cerebrovascular accident) ([place]) 05/02/2016  . Left-sided weakness 05/01/2016    Harrel Carina, MS, OTR/L 12/08/2019, 3:39 PM  [place] MAIN [facility] SERVICES [address] [place], [place], [address] Phone: [phone]   Fax:  [phone]  Name: Curtis Powell MRN: [id number] Date of Birth: [birth year]

## 2019-12-08 NOTE — Therapy (Signed)
[place] MAIN [facility] SERVICES [address] [place], [place], [address] Phone: [phone]   Fax:  [phone]  Physical Therapy Treatment  Patient Details  Name: Curtis Powell MRN: [id number] Date of Birth: Jan 06, 1966 No data recorded  Encounter Date: 12/08/2019   PT End of Session - 12/08/19 1354    Visit Number [id number]    Number of Visits 229    Date for PT Re-Evaluation 01/12/20    Authorization Type 9/10 PN 11/08/19    PT Start Time 1345    PT Stop Time 1429    PT Time Calculation (min) 44 min    Equipment Utilized During Treatment Gait belt    Activity Tolerance Patient tolerated treatment well;No increased pain    Behavior During Therapy WFL for tasks assessed/performed           Past Medical History:  Diagnosis Date  . GERD (gastroesophageal reflux disease)   . Hyperlipidemia   . Hypertension   . Obesity   . Stroke [facility])     Past Surgical History:  Procedure Laterality Date  . BRAIN SURGERY      There were no vitals filed for this visit.   Subjective Assessment - 12/08/19 1353    Subjective Patient reports no falls or LOB since last session. Has been doing his HEP.    Pertinent History  Patient is a pleasant 54 year old male who presents to physical therapy for weakness and immobility secondary to CVA.  Had a stroke in Feb 2018. He was previously fully independent, but this stroke caused severe residual deficits, mainly on the right side as well as speech, and now he is unable to walk or perform most of his ADLs on his own. Entire right side is very weak. He still has a little difficulty with speech but his swallowing is improved to baseline. His mom had to move in with him and now is his main caregiver.     Limitations Sitting;Lifting;Standing;Walking;House hold activities    Patient Stated Goals Walk without walker, walk further with walker, get some strength in back     Currently in Pain? No/denies                 Nustep Lvl 4 RPM> 70 for cardiovascular and musculoskeletal challenge x 3 minutes     Ambulating with RW:     ambulate 200 ft x CGA with RW, cues for foot clearance, upright posture, tactile cueing to PSIS and ASIS for body mechanics and sequencing; Able to turn with good mechanics keeping feet inside walker, one standing rest break    Static stand w RW in front for stability: close CGA    Stand with one hand on RW other hand reaching and transferring cones from one side of table to the other for carryover to daily task performance such as iADLs in the kitchen, lateral transfer x 10 each UE, forward transfer x10 each side   Stand without UE support bilateral UE raises 10x , 2 near LOB requiring min A to return to [facility]    Mini knee squats with UE support on RW 10x ; wheelchair behind        education and performance of pushing foot rests away and together to stand up and sit down.       Pt educated throughout session about proper posture and technique with exercises. Improved exercise technique, movement at target joints, use of target muscles after min to mod verbal, visual,  tactile cues                       PT Education - 12/08/19 1354    Education provided Yes    Education Details exercise technique, body mechanics    Person(s) Educated Patient    Methods Explanation;Demonstration;Tactile cues;Verbal cues    Comprehension Verbalized understanding;Returned demonstration;Verbal cues required;Tactile cues required            PT Short Term Goals - 10/20/19 1722      PT SHORT TERM GOAL #1   Title Patient will be independent in home exercise program to improve strength/mobility for better functional independence with ADLs.    Baseline hep compliant    Time 2    Period Weeks    Status Achieved      PT SHORT TERM GOAL #2   Title Patient will require min cueing for STS transfer with CGA for increased independence with mobility.     Baseline CGA    Time 2     Period Weeks    Status Achieved      PT SHORT TERM GOAL #3   Title Patient will maintain upright posture for > 15 seconds to demonstrate strengthened postural control muscualture     Baseline 18 seconds     Time 2    Period Weeks    Status Achieved             PT Long Term Goals - 10/20/19 0001      PT LONG TERM GOAL #1   Title Patient will perform bed mobility (rolling and supine <>sit) with Mod I  to increase mobility and  perform ADLs.     Baseline 7/14: Min A 4/21: unable to perform 6/15: improving but still min-mod A     Time 12    Period Weeks    Status Partially Met    Target Date 01/12/20      PT LONG TERM GOAL #2   Title Patient (< 84 years old) will complete five times sit to stand test in < 10 seconds indicating an increased LE strength and improved balance.    Baseline  7/14: 14.31 seconds 6/21: 16.86 seconds 4/21: 18.8 seconds 06/02/19: 19.93 seconds one hand on RW, one hand on w/c 12/30: 16 secpmds BUE support 02/11/19: 36 seconds 5/19: 20.6s from [place], one hand on walker and one on WC, 80% upright posture 7/1: 22 seconds one hand on walker one hand on w/c  9/14: 48 seconds with full upright posture    Time 12    Period Weeks    Status Partially Met    Target Date 01/12/20      PT LONG TERM GOAL #3   Title Patient will negotiate a bathroom with a walker and Min A for increased indep in home and external environment    Baseline 7/14: unable to do     Time 12    Period Weeks    Status New    Target Date 01/12/20      PT LONG TERM GOAL #4   Title Patient will increase BLE gross strength to 4+/5 as to improve functional strength for independent gait, increased standing tolerance and increased ADL ability.    Baseline  4/21: see note 06/02/19: hip flexion 4-/5, extension/abd 3/5 12/30: hip flexion 4/5 hip extension and abduction 3/5 02/11/19: hip flexion 4/5 hip extension and abduction 2+/54-/5 RLE, 10/15: 4/5 RLE 11/7: 4/5 RLE 3/3: 4/5 7/1:  hip  flexion 4/5 hip  extension 2/5 hip abduction/adduciton 2/5     Period Weeks    Status Partially Met    Target Date 01/12/20      PT LONG TERM GOAL #6   Title Patient will ambulate 200 ft with least assistive device and Supervision with no breaks to allow for increased mobility within home.    Baseline 7/14: 190 ft with RW 4/21: ambulate 150 ft 6/21: had to terminate after 90 ft due to cardiac     Time 12    Period Weeks    Status Partially Met    Target Date 01/12/20      PT LONG TERM GOAL #7   Title Patient will perform 10 MWT in <30 seconds for improved gait speed, gait mechanics, and functional capacity for mobility.     Baseline  7/14: 37.98 seconds 6/21: 46.9 seconds with RW 07/28/19: 51 seconds with RW 06/02/19: 62.5 seconds with RW 11/4: 56 seconds with AFO and RW 12/30: 53 seconds with AFO and RW     Time 12    Period Weeks    Status Partially Met    Target Date 01/12/20      PT LONG TERM GOAL #8   Title Patient will increase Berg Balance score by > 6 points (38/56) to demonstrate decreased fall risk during functional activities.    Baseline 7/14: 33/56 07/28/19: 32/56 6/21: 32/56     Time 12    Period Weeks    Status Partially Met    Target Date 01/12/20                 Plan - 12/08/19 1423    Clinical Impression Statement Patient ambulated longest recorded distance without seated rest break this session indicating progressive capacity for functional mobility. Decreased episodes of instability with standing without UE support additionally noted. Patient will continue to benefit from skilled therapy in order to increase strength and endurance    Rehab Potential Fair    Clinical Impairments Affecting Rehab Potential hx of HTN, HLD, CVA, learning disability, Diabetes, brain tumor,     PT Frequency 2x / week    PT Duration 12 weeks    PT Treatment/Interventions ADLs/Self Care Home Management;Aquatic Therapy;Ultrasound;Moist Heat;Traction;DME Instruction;Gait training;Stair  training;Functional mobility training;Therapeutic activities;Therapeutic exercise;Orthotic Fit/Training;Neuromuscular re-education;Balance training;Patient/family education;Manual techniques;Wheelchair mobility training;Passive range of motion;Energy conservation;Taping;Visual/perceptual remediation/compensation    PT Next Visit Plan progress standing balance    PT Home Exercise Plan TrA contraction, glute sets: needs updates. Potentially some time in supine flat in hospital bed, to improve hip extension deficits.     Consulted and Agree with Plan of Care Patient           Patient will benefit from skilled therapeutic intervention in order to improve the following deficits and impairments:  Abnormal gait, Decreased activity tolerance, Decreased balance, Decreased knowledge of precautions, Decreased endurance, Decreased coordination, Decreased knowledge of use of DME, Decreased mobility, Decreased range of motion, Difficulty walking, Decreased safety awareness, Decreased strength, Impaired flexibility, Impaired perceived functional ability, Impaired tone, Postural dysfunction, Improper body mechanics, Pain  Visit Diagnosis: Muscle weakness (generalized)  Other lack of coordination  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 Acute CVA (cerebrovascular accident) ([place]) 05/02/2016  . Left-sided weakness 05/01/2016   Janna Arch, PT, DPT   12/08/2019, 2:30 PM  [place] MAIN [facility] SERVICES [address] [place], [place], [address] Phone: [phone]   Fax:  [phone]  Name: Curtis Powell MRN: [id number] Date of Birth: [birth year]

## 2019-12-15 ENCOUNTER — Encounter: Payer: Self-pay | Admitting: Occupational Therapy

## 2019-12-15 ENCOUNTER — Ambulatory Visit: Payer: Medicare HMO | Admitting: Occupational Therapy

## 2019-12-15 ENCOUNTER — Ambulatory Visit: Payer: Medicare HMO

## 2019-12-15 ENCOUNTER — Other Ambulatory Visit: Payer: Self-pay

## 2019-12-15 DIAGNOSIS — R2689 Other abnormalities of gait and mobility: Secondary | ICD-10-CM

## 2019-12-15 DIAGNOSIS — M6281 Muscle weakness (generalized): Secondary | ICD-10-CM

## 2019-12-15 DIAGNOSIS — R278 Other lack of coordination: Secondary | ICD-10-CM

## 2019-12-15 DIAGNOSIS — I69351 Hemiplegia and hemiparesis following cerebral infarction affecting right dominant side: Secondary | ICD-10-CM

## 2019-12-15 NOTE — Therapy (Signed)
[place] MAIN [facility] SERVICES [address] [place], [place], [address] Phone: [phone]   Fax:  [phone]  Physical Therapy Treatment Physical Therapy Progress Note   Dates of reporting period  11/08/19   to   12/15/19  Patient Details  Name: Curtis Powell MRN: [id number] Date of Birth: 10/13/1965 No data recorded  Encounter Date: 12/15/2019    Past Medical History:  Diagnosis Date  . GERD (gastroesophageal reflux disease)   . Hyperlipidemia   . Hypertension   . Obesity   . Stroke [facility])     Past Surgical History:  Procedure Laterality Date  . BRAIN SURGERY      There were no vitals filed for this visit.          Treatment: Nustep Lvl 4 RPM> 70 for cardiovascular support seat level 8, 4 minutes ; Min A for R foot clearance.    10 MWT; 43 seconds with R foot collapse  Ambulate in hallway with wheelchair follow, BRW with cues for upright posture, foot clearance, positioning of self within walker. Cues for turning while keeping feet inside walker 150 ft; increasing R foot internal rotation and collapse with fatigue.   Standing with wheelchair behind, BRW in front: -static stand with close CGA 45 seconds.  -passing cone with ball on top of it from hand to hand 35 seconds ; good upright posture; two sets.  -sticky notes on floor for cue for heel strike step forward/backwards for carryover to ambulatory mechanics. BUE support on RW; min A with very challenging on RLE for dorsiflexion, very challenging to weight accept on RLE when fatigued. 10x each LE  Seated:  GTB seated row 10x , cues for arm alignment and body mechanics 2 sets; tactile cueing for body mechanics improved alignment.  Gluteal activation 10x 3 second holds   Patient's condition has the potential to improve in response to therapy. Maximum improvement is yet to be obtained. The anticipated improvement is attainable and reasonable in a generally predictable time.   Patient reports he is getting more steady and able to stand without help better, still wants to work on balance due to his fall and walking more.                        PT Short Term Goals - 10/20/19 1722      PT SHORT TERM GOAL #1   Title Patient will be independent in home exercise program to improve strength/mobility for better functional independence with ADLs.    Baseline hep compliant    Time 2    Period Weeks    Status Achieved      PT SHORT TERM GOAL #2   Title Patient will require min cueing for STS transfer with CGA for increased independence with mobility.     Baseline CGA    Time 2    Period Weeks    Status Achieved      PT SHORT TERM GOAL #3   Title Patient will maintain upright posture for > 15 seconds to demonstrate strengthened postural control muscualture     Baseline 18 seconds     Time 2    Period Weeks    Status Achieved             PT Long Term Goals - 10/20/19 0001      PT LONG TERM GOAL #1   Title Patient will perform bed mobility (rolling and supine <>sit) with  Mod I  to increase mobility and  perform ADLs.     Baseline 7/14: Min A 4/21: unable to perform 6/15: improving but still min-mod A     Time 12    Period Weeks    Status Partially Met    Target Date 01/12/20      PT LONG TERM GOAL #2   Title Patient (< 53 years old) will complete five times sit to stand test in < 10 seconds indicating an increased LE strength and improved balance.    Baseline  7/14: 14.31 seconds 6/21: 16.86 seconds 4/21: 18.8 seconds 06/02/19: 19.93 seconds one hand on RW, one hand on w/c 12/30: 16 secpmds BUE support 02/11/19: 36 seconds 5/19: 20.6s from [place], one hand on walker and one on WC, 80% upright posture 7/1: 22 seconds one hand on walker one hand on w/c  9/14: 48 seconds with full upright posture    Time 12    Period Weeks    Status Partially Met    Target Date 01/12/20      PT LONG TERM GOAL #3   Title Patient will negotiate a bathroom with a  walker and Min A for increased indep in home and external environment    Baseline 7/14: unable to do     Time 12    Period Weeks    Status New    Target Date 01/12/20      PT LONG TERM GOAL #4   Title Patient will increase BLE gross strength to 4+/5 as to improve functional strength for independent gait, increased standing tolerance and increased ADL ability.    Baseline  4/21: see note 06/02/19: hip flexion 4-/5, extension/abd 3/5 12/30: hip flexion 4/5 hip extension and abduction 3/5 02/11/19: hip flexion 4/5 hip extension and abduction 2+/54-/5 RLE, 10/15: 4/5 RLE 11/7: 4/5 RLE 3/3: 4/5 7/1:  hip flexion 4/5 hip extension 2/5 hip abduction/adduciton 2/5     Period Weeks    Status Partially Met    Target Date 01/12/20      PT LONG TERM GOAL #6   Title Patient will ambulate 200 ft with least assistive device and Supervision with no breaks to allow for increased mobility within home.    Baseline 7/14: 190 ft with RW 4/21: ambulate 150 ft 6/21: had to terminate after 90 ft due to cardiac     Time 12    Period Weeks    Status Partially Met    Target Date 01/12/20      PT LONG TERM GOAL #7   Title Patient will perform 10 MWT in <30 seconds for improved gait speed, gait mechanics, and functional capacity for mobility.     Baseline  7/14: 37.98 seconds 6/21: 46.9 seconds with RW 07/28/19: 51 seconds with RW 06/02/19: 62.5 seconds with RW 11/4: 56 seconds with AFO and RW 12/30: 53 seconds with AFO and RW     Time 12    Period Weeks    Status Partially Met    Target Date 01/12/20      PT LONG TERM GOAL #8   Title Patient will increase Berg Balance score by > 6 points (38/56) to demonstrate decreased fall risk during functional activities.    Baseline 7/14: 33/56 07/28/19: 32/56 6/21: 32/56     Time 12    Period Weeks    Status Partially Met    Target Date 01/12/20  Patient will benefit from skilled therapeutic intervention in order to improve the following  deficits and impairments:     Visit Diagnosis: No diagnosis found.     Problem List Patient Active Problem List   Diagnosis Date Noted  . Acute CVA (cerebrovascular accident) ([place]) 05/02/2016  . Left-sided weakness 05/01/2016   Janna Arch, PT, DPT   12/15/2019, 2:35 PM  [place] MAIN [facility] SERVICES [address] [place], [place], [address] Phone: [phone]   Fax:  [phone]  Name: KAYCEN WHITWORTH MRN: [id number] Date of Birth: 1965-06-15

## 2019-12-15 NOTE — Therapy (Addendum)
[place] MAIN [facility] SERVICES [address] [place], [place], [address] Phone: [phone]   Fax:  [phone]  Occupational Therapy Treatment  Patient Details  Name: Curtis Powell MRN: [id number] Date of Birth: 04-02-1966 No data recorded  Encounter Date: 12/15/2019   OT End of Session - 12/15/19 1358    Visit Number [id number]    Number of Visits 234    Date for OT Re-Evaluation 01/10/20    Authorization Type Progress reporting period starting 09/15/2019    OT Start Time 1348    OT Stop Time 1430    OT Time Calculation (min) 42 min    Equipment Utilized During Treatment 4# weighted dowel    Activity Tolerance Patient tolerated treatment well    Behavior During Therapy [facility] for tasks assessed/performed           Past Medical History:  Diagnosis Date  . GERD (gastroesophageal reflux disease)   . Hyperlipidemia   . Hypertension   . Obesity   . Stroke [facility])     Past Surgical History:  Procedure Laterality Date  . BRAIN SURGERY      There were no vitals filed for this visit.   Subjective Assessment - 12/15/19 1355    Subjective  Pt. reports planing a trip to see his son in [place] after the holidays.    Patient is accompanied by: Family member    Pertinent History Pt. is a 54 y.o. male who suffered a CVA on 05/01/2016. Pt. was admitted to the hospital. Once discharged, he received Home Health PT and OT services for about a month. Pt. has had multiple CVAs over the past 8 years, and has had multiple falls in the past 6 months. Pt. resides in an apartment. Pt. has caregivers for 80 hours. Pt.'s mother stays with pt. at night, and assists with IADL tasks.      Patient Stated Goals To be able to throw a ball and dribble a ball, do as much as I can for myself.     Currently in Pain? No/denies          OT TREATMENT   Therapeutic Exercise:  Pt. performed4# dowel ex.for UE strengthening secondary to weakness. Bilateral shoulder  flexion, chest press,andcircular patterns were performedfor 2 sets 10-20 reps,3# dumbbell ex. for elbow flexion and extension, forearm supination/pronation, wrist flexion/extension, and radial deviation. Pt. requires rest breaks and verbal cues for proper technique.1 sets 10-20 reps each.  Pt. reports that he is consistently doing more at home at home to improve strength, and functioning. Pt. continues to work on [place] functional reaching.Pt. continues tomake progress overall.Pt. Continues to improve with LUE strength, and overall [place] skills. Pt.requires verbal, and tactile cues for there. Ex. Technique.Pt. continues to present with limited BUE strength, and [facility] skills. Pt.continues to work on improving UE strength, and [facility] skills in order to work towards maximizing independence with ADLs, and IADLtasks.                          OT Education - 12/15/19 1357    Education provided Yes    Education Details exercises, strengthening    Person(s) Educated Patient    Methods Explanation;Demonstration    Comprehension Verbalized understanding;Returned demonstration;Verbal cues required            OT Short Term Goals - 03/03/17 1459      OT SHORT TERM GOAL #1   Title `  OT Long Term Goals - 12/01/19 1434      OT LONG TERM GOAL #4   Title Pt. will perform self dressing with minA and A/E as needed.    Baseline Pt. continues to be able to donn a T shirt independently with increased time. Min-modA donning a jacket, Mod A donning pants over his feet, and socks, minA with shoes.    Time 12    Period Weeks    Status Partially Met    Target Date 01/10/20      OT LONG TERM GOAL #6   Title Pt. will write his name efficiently with 100% legibility    Baseline Pt. continues to present with 75% legibility with increased time required when printing his name, and 50% legibility when cursive writing his name to sign in when riding the [place]    Time 12     Period Weeks    Status On-going    Target Date 01/10/20      OT LONG TERM GOAL #7   Title Pt will independently and consistently follow HEP to increase UE strength to increase functional independence    Baseline Pt. continues to work towards independence with exercises    Time 12    Status On-going    Target Date 01/10/20      OT LONG TERM GOAL #8   Title Pt. will require supervision ironing a shirt.    Baseline Pt. has progressed CGA, and complete set-up seated using a tabletop iron    Time 12    Period Weeks    Status On-going    Target Date 01/10/20      OT LONG TERM GOAL  #9   Baseline Pt. will independently be able to sew a button onto a shirt.    Time 12    Period Weeks    Status On-going    Target Date 01/10/20      OT LONG TERM GOAL  #10   TITLE Pt. will independently be able to throw a ball    Baseline Pt. continues to be limited with throwing overhand, however pt. is able to perform underhand throwing.    Time 12    Status On-going    Target Date 01/10/20      OT LONG TERM GOAL  #11   TITLE Pt. will improve UE functional reaching to be able to independently use his RUE to hand his clothes in the closest.    Baseline Pt. continues to improve with reaching up with the RUE, however has difficulty reaching the hanger rack in the closet.    Time 12    Period Weeks    Target Date 01/10/20      OT LONG TERM GOAL  #13   TITLE Pt. will increase BUE strength to be able to prepare for assisting with yardwork.    Baseline Pt. BUE strength 4/5. Pt. reports that he has been able to get out to the front.    Time 12    Period Weeks    Status On-going                 Plan - 12/15/19 1522    Clinical Impression Statement Pt. reports that he is consistently doing more at home at home to improve strength, and functioning. Pt. continues to work on [place] functional reaching.Pt. continues tomake progress overall.Pt. Continues to improve with LUE strength, and overall [place]  skills. Pt.requires verbal, and tactile cues for there. Ex. Technique.Pt. continues to  present with limited BUE strength, and [facility] skills. Pt.continues to work on improving UE strength, and [facility] skills in order to work towards maximizing independence with ADLs, and IADLtasks.   OT Occupational Profile and History Problem Focused Assessment - Including review of records relating to presenting problem    Occupational performance deficits (Please refer to evaluation for details): ADL's;IADL's    Body Structure / Function / Physical Skills ADL;FMC;ROM;IADL;Pain;Coordination    Rehab Potential Fair    Clinical Decision Making Several treatment options, min-mod task modification necessary    Comorbidities Affecting Occupational Performance: May have comorbidities impacting occupational performance    Modification or Assistance to Complete Evaluation  Min-Moderate modification of tasks or assist with assess necessary to complete eval    OT Frequency 2x / week    OT Duration 12 weeks    OT Treatment/Interventions Self-care/ADL training;Patient/family education;DME and/or AE instruction;Therapeutic exercise;Neuromuscular education    Consulted and Agree with Plan of Care Patient           Patient will benefit from skilled therapeutic intervention in order to improve the following deficits and impairments:   Body Structure / Function / Physical Skills: ADL, FMC, ROM, IADL, Pain, Coordination       Visit Diagnosis: Muscle weakness (generalized)  Other lack of coordination    Problem List Patient Active Problem List   Diagnosis Date Noted  . Acute CVA (cerebrovascular accident) ([place]) 05/02/2016  . Left-sided weakness 05/01/2016    Harrel Carina, MS, OTR/L 12/15/2019, 3:23 PM  [place] MAIN [facility] SERVICES [address] [place], [place], [address] Phone: [phone]   Fax:  [phone]  Name: Curtis Powell MRN: [id number] Date of Birth:  1966-01-12

## 2019-12-20 ENCOUNTER — Other Ambulatory Visit: Payer: Self-pay

## 2019-12-20 ENCOUNTER — Ambulatory Visit: Payer: Medicare HMO | Admitting: Occupational Therapy

## 2019-12-20 ENCOUNTER — Ambulatory Visit: Payer: Medicare HMO

## 2019-12-20 ENCOUNTER — Encounter: Payer: Self-pay | Admitting: Occupational Therapy

## 2019-12-20 DIAGNOSIS — R278 Other lack of coordination: Secondary | ICD-10-CM

## 2019-12-20 DIAGNOSIS — R2689 Other abnormalities of gait and mobility: Secondary | ICD-10-CM

## 2019-12-20 DIAGNOSIS — M6281 Muscle weakness (generalized): Secondary | ICD-10-CM | POA: Diagnosis not present

## 2019-12-20 DIAGNOSIS — I69351 Hemiplegia and hemiparesis following cerebral infarction affecting right dominant side: Secondary | ICD-10-CM

## 2019-12-20 NOTE — Therapy (Addendum)
[place] MAIN [facility] SERVICES [address] [place], [place], [address] Phone: [phone]   Fax:  [phone]  Occupational Therapy Treatment  Patient Details  Name: Curtis Powell MRN: [id number] Date of Birth: June 16, 1965 No data recorded  Encounter Date: 12/20/2019   OT End of Session - 12/20/19 1440    Visit Number [id number]    Number of Visits 234    Date for OT Re-Evaluation 01/10/20    Authorization Type Progress reporting period starting 09/15/2019    OT Start Time 1432    OT Stop Time 1515    OT Time Calculation (min) 43 min    Activity Tolerance Patient tolerated treatment well    Behavior During Therapy [facility] for tasks assessed/performed           Past Medical History:  Diagnosis Date  . GERD (gastroesophageal reflux disease)   . Hyperlipidemia   . Hypertension   . Obesity   . Stroke [facility])     Past Surgical History:  Procedure Laterality Date  . BRAIN SURGERY      There were no vitals filed for this visit.   Subjective Assessment - 12/20/19 1439    Subjective  Pt. reports planing a trip to see his son in [place] after the holidays.    Patient is accompanied by: Family member    Pertinent History Pt. is a 54 y.o. male who suffered a CVA on 05/01/2016. Pt. was admitted to the hospital. Once discharged, he received Home Health PT and OT services for about a month. Pt. has had multiple CVAs over the past 8 years, and has had multiple falls in the past 6 months. Pt. resides in an apartment. Pt. has caregivers for 80 hours. Pt.'s mother stays with pt. at night, and assists with IADL tasks.      Currently in Pain? No/denies            OT TREATMENT   Therapeutic Exercise:  Pt. performed3.5# dowel ex.for UE strengthening secondary to weakness. Bilateral shoulder flexion, chest press,andcircular patterns were performedfor 2 sets 20reps,  Pt. reports that he hopes to get a new pair of shoes for his birthday this week.  Pt. reports that he is consistently doing more at homeat home to improve strength, and functioning.Pt. Reports that it went well when he tried to make a grilled cheese sandwich at home. Pt. continues to work onBUE functional reaching.Pt. continues tomake progress overall.Pt. continues to improve with LUE strength, and overall [facility] skills.Pt.requires verbal, and tactile cues for there. Ex. technique.Pt. continues to present with limited BUE strength, and [facility] skills. Pt.continues to work on improving UE strength, and [facility] skills in order to work towards maximizing independence with ADLs, and IADLtasks.                       OT Education - 12/20/19 1439    Education provided Yes    Person(s) Educated Patient    Methods Explanation;Demonstration    Comprehension Verbalized understanding;Returned demonstration;Verbal cues required            OT Short Term Goals - 03/03/17 1459      OT SHORT TERM GOAL #1   Title `             OT Long Term Goals - 12/01/19 1434      OT LONG TERM GOAL #4   Title Pt. will perform self dressing with minA and A/E as needed.  Baseline Pt. continues to be able to donn a T shirt independently with increased time. Min-modA donning a jacket, Mod A donning pants over his feet, and socks, minA with shoes.    Time 12    Period Weeks    Status Partially Met    Target Date 01/10/20      OT LONG TERM GOAL #6   Title Pt. will write his name efficiently with 100% legibility    Baseline Pt. continues to present with 75% legibility with increased time required when printing his name, and 50% legibility when cursive writing his name to sign in when riding the van    Time 12    Period Weeks    Status On-going    Target Date 01/10/20      OT LONG TERM GOAL #7   Title Pt will independently and consistently follow HEP to increase UE strength to increase functional independence    Baseline Pt. continues to work towards independence with  exercises    Time 12    Status On-going    Target Date 01/10/20      OT LONG TERM GOAL #8   Title Pt. will require supervision ironing a shirt.    Baseline Pt. has progressed CGA, and complete set-up seated using a tabletop iron    Time 12    Period Weeks    Status On-going    Target Date 01/10/20      OT LONG TERM GOAL  #9   Baseline Pt. will independently be able to sew a button onto a shirt.    Time 12    Period Weeks    Status On-going    Target Date 01/10/20      OT LONG TERM GOAL  #10   TITLE Pt. will independently be able to throw a ball    Baseline Pt. continues to be limited with throwing overhand, however pt. is able to perform underhand throwing.    Time 12    Status On-going    Target Date 01/10/20      OT LONG TERM GOAL  #11   TITLE Pt. will improve UE functional reaching to be able to independently use his RUE to hand his clothes in the closest.    Baseline Pt. continues to improve with reaching up with the RUE, however has difficulty reaching the hanger rack in the closet.    Time 12    Period Weeks    Target Date 01/10/20      OT LONG TERM GOAL  #13   TITLE Pt. will increase BUE strength to be able to prepare for assisting with yardwork.    Baseline Pt. BUE strength 4/5. Pt. reports that he has been able to get out to the front.    Time 12    Period Weeks    Status On-going                 Plan - 12/20/19 1444    Clinical Impression Statement Pt. reports that he hopes to get a new pair of shoes for his birthday. Pt. reports that he is consistently doing more at homeat home to improve strength, and functioning.Pt. Reports that it went well when he tried to make a grilled cheese sandwich at home. Pt. continues to work onBUE functional reaching.Pt. continues tomake progress overall.Pt. continues to improve with LUE strength, and overall [facility] skills.Pt.requires verbal, and tactile cues for there. Ex. technique.Pt. continues to present with  limited BUE strength, and   [facility] skills. Pt.continues to work on improving UE strength, and [facility] skills in order to work towards maximizing independence with ADLs, and IADLtasks.   Occupational performance deficits (Please refer to evaluation for details): ADL's;IADL's    Body Structure / Function / Physical Skills ADL;FMC;ROM;IADL;Pain;Coordination    Rehab Potential Fair    Clinical Decision Making Several treatment options, min-mod task modification necessary    Comorbidities Affecting Occupational Performance: May have comorbidities impacting occupational performance    Modification or Assistance to Complete Evaluation  Min-Moderate modification of tasks or assist with assess necessary to complete eval    OT Frequency 2x / week    OT Duration 12 weeks    OT Treatment/Interventions Self-care/ADL training;Patient/family education;DME and/or AE instruction;Therapeutic exercise;Neuromuscular education    Consulted and Agree with Plan of Care Patient           Patient will benefit from skilled therapeutic intervention in order to improve the following deficits and impairments:   Body Structure / Function / Physical Skills: ADL, FMC, ROM, IADL, Pain, Coordination       Visit Diagnosis: Muscle weakness (generalized)  Other lack of coordination    Problem List Patient Active Problem List   Diagnosis Date Noted  . Acute CVA (cerebrovascular accident) ([place]) 05/02/2016  . Left-sided weakness 05/01/2016    Harrel Carina, MS, OTR/L 12/20/2019, 2:47 PM  [place] MAIN [facility] SERVICES [address] [place], [place], [address] Phone: [phone]   Fax:  [phone]  Name: Curtis Powell MRN: [id number] Date of Birth: Oct 30, 1965

## 2019-12-20 NOTE — Therapy (Signed)
[place] MAIN [facility] SERVICES [address] [place], [place], [address] Phone: [phone]   Fax:  [phone]  Physical Therapy Treatment  Patient Details  Name: Curtis Powell MRN: [id number] Date of Birth: 11-29-1965 No data recorded  Encounter Date: 12/20/2019   PT End of Session - 12/20/19 1725    Visit Number [id number]    Number of Visits 229    Date for PT Re-Evaluation 01/12/20    Authorization Type 1/10 PN 9/8    PT Start Time 1445    PT Stop Time 1529    PT Time Calculation (min) 44 min    Equipment Utilized During Treatment Gait belt    Activity Tolerance Patient tolerated treatment well;No increased pain    Behavior During Therapy WFL for tasks assessed/performed           Past Medical History:  Diagnosis Date  . GERD (gastroesophageal reflux disease)   . Hyperlipidemia   . Hypertension   . Obesity   . Stroke [facility])     Past Surgical History:  Procedure Laterality Date  . BRAIN SURGERY      There were no vitals filed for this visit.   Subjective Assessment - 12/20/19 1724    Subjective Patient reports no falls since last session. Is tired today but has been fearful of moving since his fall last week.    Pertinent History  Patient is a pleasant 54 year old male who presents to physical therapy for weakness and immobility secondary to CVA.  Had a stroke in Feb 2018. He was previously fully independent, but this stroke caused severe residual deficits, mainly on the right side as well as speech, and now he is unable to walk or perform most of his ADLs on his own. Entire right side is very weak. He still has a little difficulty with speech but his swallowing is improved to baseline. His mom had to move in with him and now is his main caregiver.     Limitations Sitting;Lifting;Standing;Walking;House hold activities    Patient Stated Goals Walk without walker, walk further with walker, get some strength in back     Currently in  Pain? No/denies              [facility] PT Assessment - 12/20/19 0001      Standardized Balance Assessment   Standardized Balance Assessment Berg Balance Test      Berg Balance Test   Sit to Stand Able to stand  independently using hands    Standing Unsupported Able to stand 2 minutes with supervision    Sitting with Back Unsupported but Feet Supported on Floor or Stool Able to sit safely and securely 2 minutes    Stand to Sit Controls descent by using hands    Transfers Able to transfer safely, definite need of hands    Standing Unsupported with Eyes Closed Able to stand 10 seconds with supervision    Standing Unsupported with Feet Together Needs help to attain position but able to stand for 30 seconds with feet together    From Standing, Reach Forward with Outstretched Arm Can reach forward >12 cm safely (5")    From Standing Position, Pick up Object from Floor Able to pick up shoe, needs supervision    From Standing Position, Turn to Look Behind Over each Shoulder Looks behind from both sides and weight shifts well    Turn 360 Degrees Needs assistance while turning    Standing Unsupported,  Alternately Place Feet on Step/Stool Able to complete >2 steps/needs minimal assist    Standing Unsupported, One Foot in [employer] balance while stepping or standing    Standing on One Leg Unable to try or needs assist to prevent fall    Total Score 31           Goals 5x STS: 14 seconds two not full stands 10 MWT; terminated mid test due to shorts falling down mid test.   Negotiate bathroom BERG: 31/56  200 ft: completed a previous session: Getting out of bed: doing it with less help now that mother has been shown.    Treatment   ambulate 68 ft with RW and w/c follow , R foot internal rotation and fear of additional wardrobe malfunction result in early termination Seated Foot external rotation/abduction stretching with leg on PT shin for alignment 30 seconds x 2 trials Standing with  stabilization of R foot into neutral alignment with weight shift onto limb (SPT guarding while PT stabilize foot) 8x 20 second holds; challenging to weight shift onto limb initially due to fear of LOB Standing with stabilization of R foot static stand 60 seconds Marching with alignment correction of R foot alternating LE UE support 10x, improved alignment with reduction of stabilization provided  seated external rotation onto RTB 10x each LE     Pt educated throughout session about proper posture and technique with exercises. Improved exercise technique, movement at target joints, use of target muscles after min to mod verbal, visual, tactile cues.             PT Education - 12/20/19 1725    Education provided Yes    Education Details exercise technique, body mechanics    Person(s) Educated Patient    Methods Explanation;Demonstration;Tactile cues;Verbal cues    Comprehension Verbalized understanding;Returned demonstration;Verbal cues required;Tactile cues required            PT Short Term Goals - 10/20/19 1722      PT SHORT TERM GOAL #1   Title Patient will be independent in home exercise program to improve strength/mobility for better functional independence with ADLs.    Baseline hep compliant    Time 2    Period Weeks    Status Achieved      PT SHORT TERM GOAL #2   Title Patient will require min cueing for STS transfer with CGA for increased independence with mobility.     Baseline CGA    Time 2    Period Weeks    Status Achieved      PT SHORT TERM GOAL #3   Title Patient will maintain upright posture for > 15 seconds to demonstrate strengthened postural control muscualture     Baseline 18 seconds     Time 2    Period Weeks    Status Achieved             PT Long Term Goals - 12/20/19 0001      PT LONG TERM GOAL #1   Title Patient will perform bed mobility (rolling and supine <>sit) with Mod I  to increase mobility and  perform ADLs.     Baseline 9/13:  able to perform with mod I occasional Min A when very fatigued 7/14: Min A 4/21: unable to perform 6/15: improving but still min-mod A     Time 12    Period Weeks    Status Partially Met    Target Date 01/12/20  PT LONG TERM GOAL #2   Title Patient (< 73 years old) will complete five times sit to stand test in < 10 seconds indicating an increased LE strength and improved balance.    Baseline  9/13: 14 seconds with one near LOB 7/14: 14.31 seconds 6/21: 16.86 seconds 4/21: 18.8 seconds 06/02/19: 19.93 seconds one hand on RW, one hand on w/c 12/30: 16 secpmds BUE support 02/11/19: 36 seconds 5/19: 20.6s from [facility], one hand on walker and one on WC, 80% upright posture 7/1: 22 seconds one hand on walker one hand on w/c  9/14: 48 seconds with full upright posture    Time 12    Period Weeks    Status Partially Met    Target Date 01/12/20      PT LONG TERM GOAL #3   Title Patient will negotiate a bathroom with a walker and Min A for increased indep in home and external environment    Baseline  9/13: able to some in standing, requires sitting down to wash hands 7/14: unable to do     Time 12    Period Weeks    Status Partially Met    Target Date 01/12/20      PT LONG TERM GOAL #4   Title Patient will increase BLE gross strength to 4+/5 as to improve functional strength for independent gait, increased standing tolerance and increased ADL ability.    Baseline  4/21: see note 06/02/19: hip flexion 4-/5, extension/abd 3/5 12/30: hip flexion 4/5 hip extension and abduction 3/5 02/11/19: hip flexion 4/5 hip extension and abduction 2+/54-/5 RLE, 10/15: 4/5 RLE 11/7: 4/5 RLE 3/3: 4/5 7/1:  hip flexion 4/5 hip extension 2/5 hip abduction/adduciton 2/5     Time 12    Period Weeks    Status Partially Met    Target Date 01/12/20      PT LONG TERM GOAL #6   Title Patient will ambulate 200 ft with least assistive device and Supervision with no breaks to allow for increased mobility within home.    Baseline  9/13: performed in previous session 7/14: 190 ft with RW 4/21: ambulate 150 ft 6/21: had to terminate after 90 ft due to cardiac     Time 12    Period Weeks    Status Achieved    Target Date 01/12/20      PT LONG TERM GOAL #7   Title Patient will perform 10 MWT in <30 seconds for improved gait speed, gait mechanics, and functional capacity for mobility.     Baseline  9/13: terminated due to wardrobe malfunction 9/8: 42 seconds with R foot collapse 7/14: 37.98 seconds 6/21: 46.9 seconds with RW 07/28/19: 51 seconds with RW 06/02/19: 62.5 seconds with RW 11/4: 56 seconds with AFO and RW 12/30: 53 seconds with AFO and RW     Time 12    Period Weeks    Status Partially Met    Target Date 01/12/20      PT LONG TERM GOAL #8   Title Patient will increase Berg Balance score by > 6 points (38/56) to demonstrate decreased fall risk during functional activities.    Baseline 9/13: 31/56 7/14: 33/56 07/28/19: 32/56 6/21: 32/56     Time 12    Period Weeks    Status Partially Met    Target Date 01/12/20                 Plan - 12/20/19 1723    Clinical Impression Statement  Patient has met goal for 200 ft, 10 MWT was terminated due to wardrobe malfunction and patient embarrassment limited patient stability and ability to perform rest of goals. His right foot internal rotation is improved with stabilizing in open and close chained positions resulting in short term carryover in standing tasks. Patient will continue to benefit from skilled therapy in order to increase strength and endurance    Rehab Potential Fair    Clinical Impairments Affecting Rehab Potential hx of HTN, HLD, CVA, learning disability, Diabetes, brain tumor,     PT Frequency 2x / week    PT Duration 12 weeks    PT Treatment/Interventions ADLs/Self Care Home Management;Aquatic Therapy;Ultrasound;Moist Heat;Traction;DME Instruction;Gait training;Stair training;Functional mobility training;Therapeutic activities;Therapeutic  exercise;Orthotic Fit/Training;Neuromuscular re-education;Balance training;Patient/family education;Manual techniques;Wheelchair mobility training;Passive range of motion;Energy conservation;Taping;Visual/perceptual remediation/compensation    PT Next Visit Plan progress standing balance    PT Home Exercise Plan TrA contraction, glute sets: needs updates. Potentially some time in supine flat in hospital bed, to improve hip extension deficits.     Consulted and Agree with Plan of Care Patient           Patient will benefit from skilled therapeutic intervention in order to improve the following deficits and impairments:  Abnormal gait, Decreased activity tolerance, Decreased balance, Decreased knowledge of precautions, Decreased endurance, Decreased coordination, Decreased knowledge of use of DME, Decreased mobility, Decreased range of motion, Difficulty walking, Decreased safety awareness, Decreased strength, Impaired flexibility, Impaired perceived functional ability, Impaired tone, Postural dysfunction, Improper body mechanics, Pain  Visit Diagnosis: Muscle weakness (generalized)  Other lack of coordination  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 Acute CVA (cerebrovascular accident) ([place]) 05/02/2016  . Left-sided weakness 05/01/2016   Janna Arch, PT, DPT   12/20/2019, 5:27 PM  [place] MAIN [facility] SERVICES [address] [place], [place], [address] Phone: [phone]   Fax:  [phone]  Name: DEYVI BONANNO MRN: [id number] Date of Birth: 10-Aug-1965

## 2019-12-21 ENCOUNTER — Ambulatory Visit: Payer: Medicare HMO | Admitting: Physical Therapy

## 2019-12-21 ENCOUNTER — Encounter: Payer: Medicare HMO | Admitting: Occupational Therapy

## 2019-12-21 NOTE — Addendum Note (Signed)
Addended by: Lucia Bitter on: 12/21/2019 11:13 AM   Modules accepted: Orders

## 2019-12-22 ENCOUNTER — Ambulatory Visit: Payer: Medicare HMO | Admitting: Occupational Therapy

## 2019-12-22 ENCOUNTER — Encounter: Payer: Self-pay | Admitting: Occupational Therapy

## 2019-12-22 ENCOUNTER — Ambulatory Visit: Payer: Medicare HMO

## 2019-12-22 ENCOUNTER — Other Ambulatory Visit: Payer: Self-pay

## 2019-12-22 DIAGNOSIS — M6281 Muscle weakness (generalized): Secondary | ICD-10-CM

## 2019-12-22 DIAGNOSIS — I69351 Hemiplegia and hemiparesis following cerebral infarction affecting right dominant side: Secondary | ICD-10-CM

## 2019-12-22 DIAGNOSIS — R2689 Other abnormalities of gait and mobility: Secondary | ICD-10-CM

## 2019-12-22 DIAGNOSIS — R278 Other lack of coordination: Secondary | ICD-10-CM

## 2019-12-22 NOTE — Therapy (Signed)
[facility] MAIN [facility] SERVICES [address] [place], [place], [address] Phone: [phone]   Fax:  [phone]  Physical Therapy Treatment  Patient Details  Name: Curtis Powell MRN: [id number] Date of Birth: [birth year] No data recorded  Encounter Date: 12/22/2019   PT End of Session - 12/22/19 1300    Visit Number [id number]    Number of Visits 229    Date for PT Re-Evaluation 01/12/20    Authorization Type 2/10 PN 9/8    PT Start Time 1345    PT Stop Time 1429    PT Time Calculation (min) 44 min    Equipment Utilized During Treatment Gait belt    Activity Tolerance Patient tolerated treatment well;No increased pain    Behavior During Therapy WFL for tasks assessed/performed           Past Medical History:  Diagnosis Date  . GERD (gastroesophageal reflux disease)   . Hyperlipidemia   . Hypertension   . Obesity   . Stroke [facility])     Past Surgical History:  Procedure Laterality Date  . BRAIN SURGERY      There were no vitals filed for this visit.   Subjective Assessment - 12/22/19 1349    Subjective Patient reports no falls since last session. Patient notes that he has been performing seated and some standing exercises with no pain.    Pertinent History  Patient is a pleasant 54 year old male who presents to physical therapy for weakness and immobility secondary to CVA.  Had a stroke in Feb 2018. He was previously fully independent, but this stroke caused severe residual deficits, mainly on the right side as well as speech, and now he is unable to walk or perform most of his ADLs on his own. Entire right side is very weak. He still has a little difficulty with speech but his swallowing is improved to baseline. His mom had to move in with him and now is his main caregiver.     Limitations Sitting;Lifting;Standing;Walking;House hold activities    Patient Stated Goals Walk without walker, walk further with walker, get some strength in back      Currently in Pain? No/denies                 Seated: - Horizontal ABD with elbows flexed with green resistance band to promote upright posture. Patient cued to scoot forward in chair in order to perform exercise without back support. 12x       -     Ball kicks x 15 each leg for timing,  coordination, and sequencing of  movement of BLEs.       -    Bolster kicks 12x for timing, coordination, and sequencing. Increased time to create symmetrical LE movement.       -    Balloon taps with one hand on RW for stability. PT taps balloon to various heights while SPT provides CGA for safety. 15 x 2 sets  Seated boxing: cues for sequencing, body mechanics, and pace/rhythm for functional contraction and timing of muscle recruitment: - Forward jabs to mitts on PT hands with no back support to promote core activation and upright posture. 2 x 30 seconds -Cross body punches to mitts on PT hands with no back support for promote core activation when crossing midline 2x 30 seconds    Standing with RW: - Static balance with head turns: 5x horizontal. LOB x 2, especially when turning R, as pt  would displace center of mass posteriorly.          Gait:  Patient ambulates 7ft x 1 with RW and CGA for safety. Patient requires verbal cues to maintain upright posture, as pt would increase forward flexion when fatigued. Patient requires seated rest break after gait trial. Patient ambulates additional 80 ft after seated rest break, requiring cues.   Exercises performed in seated this session due to sustained elevated HR. Patient ranges between 100-110bpm. Denies any headaches, dizziness or SOB.                       PT Education - 12/22/19 1300    Education provided Yes    Education Details exercise technique, pacing, body mechanics    Person(s) Educated Patient    Methods Explanation;Demonstration;Tactile cues;Verbal cues    Comprehension Verbalized understanding;Returned  demonstration;Verbal cues required;Tactile cues required            PT Short Term Goals - 10/20/19 1722      PT SHORT TERM GOAL #1   Title Patient will be independent in home exercise program to improve strength/mobility for better functional independence with ADLs.    Baseline hep compliant    Time 2    Period Weeks    Status Achieved      PT SHORT TERM GOAL #2   Title Patient will require min cueing for STS transfer with CGA for increased independence with mobility.     Baseline CGA    Time 2    Period Weeks    Status Achieved      PT SHORT TERM GOAL #3   Title Patient will maintain upright posture for > 15 seconds to demonstrate strengthened postural control muscualture     Baseline 18 seconds     Time 2    Period Weeks    Status Achieved             PT Long Term Goals - 12/20/19 0001      PT LONG TERM GOAL #1   Title Patient will perform bed mobility (rolling and supine <>sit) with Mod I  to increase mobility and  perform ADLs.     Baseline 9/13: able to perform with mod I occasional Min A when very fatigued 7/14: Min A 4/21: unable to perform 6/15: improving but still min-mod A     Time 12    Period Weeks    Status Partially Met    Target Date 01/12/20      PT LONG TERM GOAL #2   Title Patient (< 56 years old) will complete five times sit to stand test in < 10 seconds indicating an increased LE strength and improved balance.    Baseline  9/13: 14 seconds with one near LOB 7/14: 14.31 seconds 6/21: 16.86 seconds 4/21: 18.8 seconds 06/02/19: 19.93 seconds one hand on RW, one hand on w/c 12/30: 16 secpmds BUE support 02/11/19: 36 seconds 5/19: 20.6s from [facility], one hand on walker and one on WC, 80% upright posture 7/1: 22 seconds one hand on walker one hand on w/c  9/14: 48 seconds with full upright posture    Time 12    Period Weeks    Status Partially Met    Target Date 01/12/20      PT LONG TERM GOAL #3   Title Patient will negotiate a bathroom with a walker and  Min A for increased indep in home and external environment    Baseline  9/13: able to some in standing, requires sitting down to wash hands 7/14: unable to do     Time 12    Period Weeks    Status Partially Met    Target Date 01/12/20      PT LONG TERM GOAL #4   Title Patient will increase BLE gross strength to 4+/5 as to improve functional strength for independent gait, increased standing tolerance and increased ADL ability.    Baseline  4/21: see note 06/02/19: hip flexion 4-/5, extension/abd 3/5 12/30: hip flexion 4/5 hip extension and abduction 3/5 02/11/19: hip flexion 4/5 hip extension and abduction 2+/54-/5 RLE, 10/15: 4/5 RLE 11/7: 4/5 RLE 3/3: 4/5 7/1:  hip flexion 4/5 hip extension 2/5 hip abduction/adduciton 2/5     Time 12    Period Weeks    Status Partially Met    Target Date 01/12/20      PT LONG TERM GOAL #6   Title Patient will ambulate 200 ft with least assistive device and Supervision with no breaks to allow for increased mobility within home.    Baseline 9/13: performed in previous session 7/14: 190 ft with RW 4/21: ambulate 150 ft 6/21: had to terminate after 90 ft due to cardiac     Time 12    Period Weeks    Status Achieved    Target Date 01/12/20      PT LONG TERM GOAL #7   Title Patient will perform 10 MWT in <30 seconds for improved gait speed, gait mechanics, and functional capacity for mobility.     Baseline  9/13: terminated due to wardrobe malfunction 9/8: 42 seconds with R foot collapse 7/14: 37.98 seconds 6/21: 46.9 seconds with RW 07/28/19: 51 seconds with RW 06/02/19: 62.5 seconds with RW 11/4: 56 seconds with AFO and RW 12/30: 53 seconds with AFO and RW     Time 12    Period Weeks    Status Partially Met    Target Date 01/12/20      PT LONG TERM GOAL #8   Title Patient will increase Berg Balance score by > 6 points (38/56) to demonstrate decreased fall risk during functional activities.    Baseline 9/13: 31/56 7/14: 33/56 07/28/19: 32/56 6/21: 32/56      Time 12    Period Weeks    Status Partially Met    Target Date 01/12/20                 Plan - 12/22/19 1631    Clinical Impression Statement Patient demonstrates good activity tolerance this session despite elevated HR and performing mainly seated exercise. Patient continues to display instability in static stance and requires min A to stabilize. Patient will continue to benefit from skilled therapy in order to increase strength and endurance.    Rehab Potential Fair    Clinical Impairments Affecting Rehab Potential hx of HTN, HLD, CVA, learning disability, Diabetes, brain tumor,     PT Frequency 2x / week    PT Duration 12 weeks    PT Treatment/Interventions ADLs/Self Care Home Management;Aquatic Therapy;Ultrasound;Moist Heat;Traction;DME Instruction;Gait training;Stair training;Functional mobility training;Therapeutic activities;Therapeutic exercise;Orthotic Fit/Training;Neuromuscular re-education;Balance training;Patient/family education;Manual techniques;Wheelchair mobility training;Passive range of motion;Energy conservation;Taping;Visual/perceptual remediation/compensation    PT Next Visit Plan progress standing balance    PT Home Exercise Plan TrA contraction, glute sets: needs updates. Potentially some time in supine flat in hospital bed, to improve hip extension deficits.     Consulted and Agree with Plan of Care Patient  Patient will benefit from skilled therapeutic intervention in order to improve the following deficits and impairments:  Abnormal gait, Decreased activity tolerance, Decreased balance, Decreased knowledge of precautions, Decreased endurance, Decreased coordination, Decreased knowledge of use of DME, Decreased mobility, Decreased range of motion, Difficulty walking, Decreased safety awareness, Decreased strength, Impaired flexibility, Impaired perceived functional ability, Impaired tone, Postural dysfunction, Improper body mechanics, Pain  Visit  Diagnosis: Muscle weakness (generalized)  Other abnormalities of gait and mobility     Problem List Patient Active Problem List   Diagnosis Date Noted  . Acute CVA (cerebrovascular accident) ([place]) 05/02/2016  . Left-sided weakness 05/01/2016   Tonny Bollman, SPT This entire session was performed under direct supervision and direction of a licensed therapist/therapist assistant . I have personally read, edited and approve of the note as written.  Janna Arch, PT, DPT   12/22/2019, 4:32 PM  [place] MAIN [facility] SERVICES [address] [place], [place], [address] Phone: [phone]   Fax:  [phone]  Name: Curtis Powell MRN: [id number] Date of Birth: 18-Nov-1965

## 2019-12-22 NOTE — Therapy (Signed)
[place] MAIN [facility] SERVICES [address] [place], [place], [address] Phone: [phone]   Fax:  [phone]  Occupational Therapy Treatment  Patient Details  Name: Curtis Powell MRN: [id number] Date of Birth: 1965-06-11 No data recorded  Encounter Date: 12/22/2019   OT End of Session - 12/22/19 1314    Visit Number [id number]    Number of Visits 234    Date for OT Re-Evaluation 01/10/20    Authorization Type Progress reporting period starting 09/15/2019    OT Start Time 1307    OT Stop Time 1345    OT Time Calculation (min) 38 min    Activity Tolerance Patient tolerated treatment well    Behavior During Therapy [facility] for tasks assessed/performed           Past Medical History:  Diagnosis Date  . GERD (gastroesophageal reflux disease)   . Hyperlipidemia   . Hypertension   . Obesity   . Stroke [facility])     Past Surgical History:  Procedure Laterality Date  . BRAIN SURGERY      There were no vitals filed for this visit.   Subjective Assessment - 12/22/19 1313    Patient is accompanied by: Family member    Pertinent History Pt. is a 54 y.o. male who suffered a CVA on 05/01/2016. Pt. was admitted to the hospital. Once discharged, he received Home Health PT and OT services for about a month. Pt. has had multiple CVAs over the past 8 years, and has had multiple falls in the past 6 months. Pt. resides in an apartment. Pt. has caregivers for 80 hours. Pt.'s mother stays with pt. at night, and assists with IADL tasks.      Patient Stated Goals To be able to throw a ball and dribble a ball, do as much as I can for myself.     Currently in Pain? No/denies          Neuromuscular re-education:  Pt. worked on right hand [facility] skills using [employer] building services. Pt. worked on maintaining the hold on tweezers while grasping thin one inch pegs, and turning them from a horizontal to vertical position.  Pt. had difficulty using the tweezers  to turn sticks.  Pt. was late for the session secondary to using the rest room. Pt. required increased time to complete the task. Pt. had difficulty maintaining his grasp on the resistive tweezers enough to maintain them in a closed grasp position. Pt. continues to work on improving UE strength, and [facility] skills in order to work on improving UE functioning needed for maximizing independence with ADLs, and IADLs.                       OT Education - 12/22/19 1314    Education provided Yes    Person(s) Educated Patient    Methods Explanation;Demonstration    Comprehension Verbalized understanding;Returned demonstration;Verbal cues required            OT Short Term Goals - 03/03/17 1459      OT SHORT TERM GOAL #1   Title `             OT Long Term Goals - 12/01/19 1434      OT LONG TERM GOAL #4   Title Pt. will perform self dressing with minA and A/E as needed.    Baseline Pt. continues to be able to donn a T shirt independently with increased time. Min-modA  donning a jacket, Mod A donning pants over his feet, and socks, minA with shoes.    Time 12    Period Weeks    Status Partially Met    Target Date 01/10/20      OT LONG TERM GOAL #6   Title Pt. will write his name efficiently with 100% legibility    Baseline Pt. continues to present with 75% legibility with increased time required when printing his name, and 50% legibility when cursive writing his name to sign in when riding the [place]    Time 12    Period Weeks    Status On-going    Target Date 01/10/20      OT LONG TERM GOAL #7   Title Pt will independently and consistently follow HEP to increase UE strength to increase functional independence    Baseline Pt. continues to work towards independence with exercises    Time 12    Status On-going    Target Date 01/10/20      OT LONG TERM GOAL #8   Title Pt. will require supervision ironing a shirt.    Baseline Pt. has progressed CGA, and complete set-up  seated using a tabletop iron    Time 12    Period Weeks    Status On-going    Target Date 01/10/20      OT LONG TERM GOAL  #9   Baseline Pt. will independently be able to sew a button onto a shirt.    Time 12    Period Weeks    Status On-going    Target Date 01/10/20      OT LONG TERM GOAL  #10   TITLE Pt. will independently be able to throw a ball    Baseline Pt. continues to be limited with throwing overhand, however pt. is able to perform underhand throwing.    Time 12    Status On-going    Target Date 01/10/20      OT LONG TERM GOAL  #11   TITLE Pt. will improve UE functional reaching to be able to independently use his RUE to hand his clothes in the closest.    Baseline Pt. continues to improve with reaching up with the RUE, however has difficulty reaching the hanger rack in the closet.    Time 12    Period Weeks    Target Date 01/10/20      OT LONG TERM GOAL  #13   TITLE Pt. will increase BUE strength to be able to prepare for assisting with yardwork.    Baseline Pt. BUE strength 4/5. Pt. reports that he has been able to get out to the front.    Time 12    Period Weeks    Status On-going                 Plan - 12/22/19 1320    Clinical Impression Statement Pt. was late for the session secondary to using the rest room. Pt. required increased time to complete the task. Pt. had difficulty maintaining his grasp on the resistive tweezers enough to maintain them in a closed grasp position. Pt. continues to work on improving UE strength, and [facility] skills in order to work on improving UE functioning needed for maximizing independence with ADLs, and IADLs.   OT Occupational Profile and History Problem Focused Assessment - Including review of records relating to presenting problem    Occupational performance deficits (Please refer to evaluation for details): ADL's;IADL's  Body Structure / Function / Physical Skills ADL;FMC;ROM;IADL;Pain;Coordination    Rehab Potential Fair     Clinical Decision Making Several treatment options, min-mod task modification necessary    Comorbidities Affecting Occupational Performance: May have comorbidities impacting occupational performance    Modification or Assistance to Complete Evaluation  Min-Moderate modification of tasks or assist with assess necessary to complete eval    OT Frequency 2x / week    OT Duration 12 weeks    OT Treatment/Interventions Self-care/ADL training;Patient/family education;DME and/or AE instruction;Therapeutic exercise;Neuromuscular education    Consulted and Agree with Plan of Care Patient           Patient will benefit from skilled therapeutic intervention in order to improve the following deficits and impairments:   Body Structure / Function / Physical Skills: ADL, FMC, ROM, IADL, Pain, Coordination       Visit Diagnosis: Muscle weakness (generalized)  Other lack of coordination    Problem List Patient Active Problem List   Diagnosis Date Noted  . Acute CVA (cerebrovascular accident) ([place]) 05/02/2016  . Left-sided weakness 05/01/2016    Harrel Carina, MS, OTR/L 12/22/2019, 1:51 PM  [place] MAIN [facility] SERVICES [address] [place], [place], [address] Phone: [phone]   Fax:  [phone]  Name: Curtis Powell MRN: [id number] Date of Birth: [birth year]

## 2019-12-23 ENCOUNTER — Encounter: Payer: Medicare HMO | Admitting: Occupational Therapy

## 2019-12-23 ENCOUNTER — Ambulatory Visit: Payer: Medicare HMO

## 2019-12-27 ENCOUNTER — Other Ambulatory Visit: Payer: Self-pay

## 2019-12-27 ENCOUNTER — Encounter: Payer: Self-pay | Admitting: Occupational Therapy

## 2019-12-27 ENCOUNTER — Ambulatory Visit: Payer: Medicare HMO | Admitting: Occupational Therapy

## 2019-12-27 ENCOUNTER — Ambulatory Visit: Payer: Medicare HMO

## 2019-12-27 DIAGNOSIS — M6281 Muscle weakness (generalized): Secondary | ICD-10-CM

## 2019-12-27 DIAGNOSIS — R278 Other lack of coordination: Secondary | ICD-10-CM

## 2019-12-27 DIAGNOSIS — R2689 Other abnormalities of gait and mobility: Secondary | ICD-10-CM

## 2019-12-27 DIAGNOSIS — I69351 Hemiplegia and hemiparesis following cerebral infarction affecting right dominant side: Secondary | ICD-10-CM

## 2019-12-27 NOTE — Therapy (Signed)
[place] MAIN [facility] SERVICES [address] [place], [place], [address] Phone: [phone]   Fax:  [phone]  Occupational Therapy Treatment  Patient Details  Name: Curtis Powell MRN: [id number] Date of Birth: 06-17-1965 No data recorded  Encounter Date: 12/27/2019   OT End of Session - 12/27/19 1437    Visit Number [id number]    Number of Visits 234    Date for OT Re-Evaluation 01/10/20    Authorization Type Progress reporting period starting 09/15/2019    OT Start Time 1436    OT Stop Time 1515    OT Time Calculation (min) 39 min    Activity Tolerance Patient tolerated treatment well    Behavior During Therapy [facility] for tasks assessed/performed           Past Medical History:  Diagnosis Date  . GERD (gastroesophageal reflux disease)   . Hyperlipidemia   . Hypertension   . Obesity   . Stroke [facility])     Past Surgical History:  Procedure Laterality Date  . BRAIN SURGERY      There were no vitals filed for this visit.   Subjective Assessment - 12/27/19 1436    Subjective  Pt. reports that he would like to work on improving strength    Patient is accompanied by: Family member    Pertinent History Pt. is a 54 y.o. male who suffered a CVA on 05/01/2016. Pt. was admitted to the hospital. Once discharged, he received Home Health PT and OT services for about a month. Pt. has had multiple CVAs over the past 8 years, and has had multiple falls in the past 6 months. Pt. resides in an apartment. Pt. has caregivers for 80 hours. Pt.'s mother stays with pt. at night, and assists with IADL tasks.      Patient Stated Goals To be able to throw a ball and dribble a ball, do as much as I can for myself.     Currently in Pain? No/denies           OT TREATMENT   Therapeutic Exercise:  Pt. performed3.5# dowel ex.for UE strengthening secondary to weakness. Bilateral shoulder flexion, chest press,andcircular patterns were performedfor 2 sets  10-20reps,3# dumbbell ex. for elbow flexion and extension, forearm supination/pronation, and wrist flexion/extension. Pt. requires rest breaks and verbal cues for proper technique.1 sets 10-20reps each. Pt. worked on [employer] with the UBE seated with minimal resistance for 2 min. &30 sec.  Pt. reports reports that he worked on accessing Pt. continues to consistently do more at [employer] home to improve strength, and functioning.Pt. continues to work onBUE functional reaching.Pt. continues tomake progress overall.Pt. Continues to improve with LUE strength, and overall [place] skills.Pt.requires verbal, and tactile cues for there. Ex. Technique.Pt. continues to present with limited BUE strength, and [facility] skills. Pt.continues to work on improving UE strength, and [facility] skills in order to work towards maximizing independence with ADLs, and IADLtasks.                          OT Short Term Goals - 03/03/17 1459      OT SHORT TERM GOAL #1   Title `             OT Long Term Goals - 12/01/19 1434      OT LONG TERM GOAL #4   Title Pt. will perform self dressing with minA and A/E as needed.    Baseline Pt.  continues to be able to donn a T shirt independently with increased time. Min-modA donning a jacket, Mod A donning pants over his feet, and socks, minA with shoes.    Time 12    Period Weeks    Status Partially Met    Target Date 01/10/20      OT LONG TERM GOAL #6   Title Pt. will write his name efficiently with 100% legibility    Baseline Pt. continues to present with 75% legibility with increased time required when printing his name, and 50% legibility when cursive writing his name to sign in when riding the [place]    Time 12    Period Weeks    Status On-going    Target Date 01/10/20      OT LONG TERM GOAL #7   Title Pt will independently and consistently follow HEP to increase UE strength to increase functional independence    Baseline Pt. continues  to work towards independence with exercises    Time 12    Status On-going    Target Date 01/10/20      OT LONG TERM GOAL #8   Title Pt. will require supervision ironing a shirt.    Baseline Pt. has progressed CGA, and complete set-up seated using a tabletop iron    Time 12    Period Weeks    Status On-going    Target Date 01/10/20      OT LONG TERM GOAL  #9   Baseline Pt. will independently be able to sew a button onto a shirt.    Time 12    Period Weeks    Status On-going    Target Date 01/10/20      OT LONG TERM GOAL  #10   TITLE Pt. will independently be able to throw a ball    Baseline Pt. continues to be limited with throwing overhand, however pt. is able to perform underhand throwing.    Time 12    Status On-going    Target Date 01/10/20      OT LONG TERM GOAL  #11   TITLE Pt. will improve UE functional reaching to be able to independently use his RUE to hand his clothes in the closest.    Baseline Pt. continues to improve with reaching up with the RUE, however has difficulty reaching the hanger rack in the closet.    Time 12    Period Weeks    Target Date 01/10/20      OT LONG TERM GOAL  #13   TITLE Pt. will increase BUE strength to be able to prepare for assisting with yardwork.    Baseline Pt. BUE strength 4/5. Pt. reports that he has been able to get out to the front.    Time 12    Period Weeks    Status On-going                 Plan - 12/27/19 1439    Clinical Impression Statement Pt. reports reports that he worked on accessing Pt. continues to consistently do more at homeat home to improve strength, and functioning.Pt. continues to work onBUE functional reaching.Pt. continues tomake progress overall.Pt. Continues to improve with LUE strength, and overall [place] skills.Pt.requires verbal, and tactile cues for there. Ex. Technique.Pt. continues to present with limited BUE strength, and [facility] skills. Pt.continues to work on improving UE strength, and  [facility] skills in order to work towards maximizing independence with ADLs, and IADLtasks.   OT Occupational Profile  and History Problem Focused Assessment - Including review of records relating to presenting problem    Occupational performance deficits (Please refer to evaluation for details): ADL's;IADL's    Body Structure / Function / Physical Skills ADL;FMC;ROM;IADL;Pain;Coordination    Rehab Potential Fair    Clinical Decision Making Several treatment options, min-mod task modification necessary    Comorbidities Affecting Occupational Performance: May have comorbidities impacting occupational performance    Modification or Assistance to Complete Evaluation  Min-Moderate modification of tasks or assist with assess necessary to complete eval    OT Frequency 2x / week    OT Duration 12 weeks    OT Treatment/Interventions Self-care/ADL training;Patient/family education;DME and/or AE instruction;Therapeutic exercise;Neuromuscular education    Consulted and Agree with Plan of Care Patient           Patient will benefit from skilled therapeutic intervention in order to improve the following deficits and impairments:   Body Structure / Function / Physical Skills: ADL, FMC, ROM, IADL, Pain, Coordination       Visit Diagnosis: Muscle weakness (generalized)  Other lack of coordination    Problem List Patient Active Problem List   Diagnosis Date Noted  . Acute CVA (cerebrovascular accident) ([place]) 05/02/2016  . Left-sided weakness 05/01/2016    Harrel Carina, MS, OTR/L 12/27/2019, 2:53 PM  [place] MAIN [facility] SERVICES [address] [place], [place], [address] Phone: [phone]   Fax:  [phone]  Name: Curtis Powell MRN: [id number] Date of Birth: 04-09-1965

## 2019-12-27 NOTE — Therapy (Signed)
[place] MAIN [facility] SERVICES [address] [place], [place], [address] Phone: [phone]   Fax:  [phone]  Physical Therapy Treatment  Patient Details  Name: Curtis Powell MRN: [id number] Date of Birth: 16-Oct-1965 No data recorded  Encounter Date: 12/27/2019   PT End of Session - 12/27/19 1350    Visit Number [id number]    Number of Visits 229    Date for PT Re-Evaluation 01/12/20    Authorization Type 3/10 PN 9/8    PT Start Time 1344    PT Stop Time 1429    PT Time Calculation (min) 45 min    Equipment Utilized During Treatment Gait belt    Activity Tolerance Patient tolerated treatment well;No increased pain    Behavior During Therapy WFL for tasks assessed/performed           Past Medical History:  Diagnosis Date  . GERD (gastroesophageal reflux disease)   . Hyperlipidemia   . Hypertension   . Obesity   . Stroke [facility])     Past Surgical History:  Procedure Laterality Date  . BRAIN SURGERY      There were no vitals filed for this visit.   Subjective Assessment - 12/27/19 1349    Subjective Patient reports practicing his balance in the bathroom, unable to turn at this time. Has not practiced walking with his caregiver. No falls or LOB since last session.    Pertinent History  Patient is a pleasant 54 year old male who presents to physical therapy for weakness and immobility secondary to CVA.  Had a stroke in Feb 2018. He was previously fully independent, but this stroke caused severe residual deficits, mainly on the right side as well as speech, and now he is unable to walk or perform most of his ADLs on his own. Entire right side is very weak. He still has a little difficulty with speech but his swallowing is improved to baseline. His mom had to move in with him and now is his main caregiver.     Limitations Sitting;Lifting;Standing;Walking;House hold activities    Patient Stated Goals Walk without walker, walk further with  walker, get some strength in back     Currently in Pain? No/denies                 Nustep Lvl 4 RPM> 70 for cardiovascular and musculoskeletal challenge x 3 minutes     Ambulating with RW:     ambulate 120 ft x CGA with RW, cues for foot clearance, upright posture, tactile cueing to PSIS and ASIS for body mechanics and sequencing; Able to turn with good mechanics keeping feet inside walker, one standing rest break -increased challenge with LLE this session with poor foot clearance     Static stand w RW in front for stability: close CGA      Stand without UE support bilateral UE raises 10x , 2 near LOB requiring min A to return to [facility]    Mini knee squats with UE support on RW 10x ; wheelchair behind    Seated:   Seated with # 5  ankle weights  -Seated marches with upright posture, back away from back of chair for abdominal/trunk activation/stabilization, 20x each LE -Seated LAQ with 3 second holds, 15x each LE, cueing for muscle activation and sequencing for neutral alignment -Seated IR/ER with cueing for stabilizing knee placement with lateral foot movement for optimal muscle recruitment, 10x each LE   education and performance of  pushing foot rests away and together to stand up and sit down.       Pt educated throughout session about proper posture and technique with exercises. Improved exercise technique, movement at target joints, use of target muscles after min to mod verbal, visual, tactile cues                       PT Education - 12/27/19 1350    Education provided Yes    Education Details exercise technique, body mechanics    Person(s) Educated Patient    Methods Explanation;Demonstration;Tactile cues;Verbal cues    Comprehension Verbalized understanding;Returned demonstration;Verbal cues required;Tactile cues required            PT Short Term Goals - 10/20/19 1722      PT SHORT TERM GOAL #1   Title Patient will be independent in home  exercise program to improve strength/mobility for better functional independence with ADLs.    Baseline hep compliant    Time 2    Period Weeks    Status Achieved      PT SHORT TERM GOAL #2   Title Patient will require min cueing for STS transfer with CGA for increased independence with mobility.     Baseline CGA    Time 2    Period Weeks    Status Achieved      PT SHORT TERM GOAL #3   Title Patient will maintain upright posture for > 15 seconds to demonstrate strengthened postural control muscualture     Baseline 18 seconds     Time 2    Period Weeks    Status Achieved             PT Long Term Goals - 12/20/19 0001      PT LONG TERM GOAL #1   Title Patient will perform bed mobility (rolling and supine <>sit) with Mod I  to increase mobility and  perform ADLs.     Baseline 9/13: able to perform with mod I occasional Min A when very fatigued 7/14: Min A 4/21: unable to perform 6/15: improving but still min-mod A     Time 12    Period Weeks    Status Partially Met    Target Date 01/12/20      PT LONG TERM GOAL #2   Title Patient (< 47 years old) will complete five times sit to stand test in < 10 seconds indicating an increased LE strength and improved balance.    Baseline  9/13: 14 seconds with one near LOB 7/14: 14.31 seconds 6/21: 16.86 seconds 4/21: 18.8 seconds 06/02/19: 19.93 seconds one hand on RW, one hand on w/c 12/30: 16 secpmds BUE support 02/11/19: 36 seconds 5/19: 20.6s from [facility], one hand on walker and one on WC, 80% upright posture 7/1: 22 seconds one hand on walker one hand on w/c  9/14: 48 seconds with full upright posture    Time 12    Period Weeks    Status Partially Met    Target Date 01/12/20      PT LONG TERM GOAL #3   Title Patient will negotiate a bathroom with a walker and Min A for increased indep in home and external environment    Baseline  9/13: able to some in standing, requires sitting down to wash hands 7/14: unable to do     Time 12    Period  Weeks    Status Partially Met    Target Date  01/12/20      PT LONG TERM GOAL #4   Title Patient will increase BLE gross strength to 4+/5 as to improve functional strength for independent gait, increased standing tolerance and increased ADL ability.    Baseline  4/21: see note 06/02/19: hip flexion 4-/5, extension/abd 3/5 12/30: hip flexion 4/5 hip extension and abduction 3/5 02/11/19: hip flexion 4/5 hip extension and abduction 2+/54-/5 RLE, 10/15: 4/5 RLE 11/7: 4/5 RLE 3/3: 4/5 7/1:  hip flexion 4/5 hip extension 2/5 hip abduction/adduciton 2/5     Time 12    Period Weeks    Status Partially Met    Target Date 01/12/20      PT LONG TERM GOAL #6   Title Patient will ambulate 200 ft with least assistive device and Supervision with no breaks to allow for increased mobility within home.    Baseline 9/13: performed in previous session 7/14: 190 ft with RW 4/21: ambulate 150 ft 6/21: had to terminate after 90 ft due to cardiac     Time 12    Period Weeks    Status Achieved    Target Date 01/12/20      PT LONG TERM GOAL #7   Title Patient will perform 10 MWT in <30 seconds for improved gait speed, gait mechanics, and functional capacity for mobility.     Baseline  9/13: terminated due to wardrobe malfunction 9/8: 42 seconds with R foot collapse 7/14: 37.98 seconds 6/21: 46.9 seconds with RW 07/28/19: 51 seconds with RW 06/02/19: 62.5 seconds with RW 11/4: 56 seconds with AFO and RW 12/30: 53 seconds with AFO and RW     Time 12    Period Weeks    Status Partially Met    Target Date 01/12/20      PT LONG TERM GOAL #8   Title Patient will increase Berg Balance score by > 6 points (38/56) to demonstrate decreased fall risk during functional activities.    Baseline 9/13: 31/56 7/14: 33/56 07/28/19: 32/56 6/21: 32/56     Time 12    Period Weeks    Status Partially Met    Target Date 01/12/20                 Plan - 12/27/19 1412    Clinical Impression Statement Patient presents with  increased challenge with foot clearance and lifting of LLE. Is more fatigued in general today and requires more frequent rest breaks and monitoring of vitals. No pain reported during interventions just fatigue. Patient will continue to benefit from skilled therapy in order to increase strength and endurance    Rehab Potential Fair    Clinical Impairments Affecting Rehab Potential hx of HTN, HLD, CVA, learning disability, Diabetes, brain tumor,     PT Frequency 2x / week    PT Duration 12 weeks    PT Treatment/Interventions ADLs/Self Care Home Management;Aquatic Therapy;Ultrasound;Moist Heat;Traction;DME Instruction;Gait training;Stair training;Functional mobility training;Therapeutic activities;Therapeutic exercise;Orthotic Fit/Training;Neuromuscular re-education;Balance training;Patient/family education;Manual techniques;Wheelchair mobility training;Passive range of motion;Energy conservation;Taping;Visual/perceptual remediation/compensation    PT Next Visit Plan progress standing balance    PT Home Exercise Plan TrA contraction, glute sets: needs updates. Potentially some time in supine flat in hospital bed, to improve hip extension deficits.     Consulted and Agree with Plan of Care Patient           Patient will benefit from skilled therapeutic intervention in order to improve the following deficits and impairments:  Abnormal gait, Decreased activity tolerance, Decreased balance, Decreased  knowledge of precautions, Decreased endurance, Decreased coordination, Decreased knowledge of use of DME, Decreased mobility, Decreased range of motion, Difficulty walking, Decreased safety awareness, Decreased strength, Impaired flexibility, Impaired perceived functional ability, Impaired tone, Postural dysfunction, Improper body mechanics, Pain  Visit Diagnosis: Muscle weakness (generalized)  Other abnormalities of gait and mobility  Hemiplegia and hemiparesis following cerebral infarction affecting  right dominant side (HCC)  Other lack of coordination     Problem List Patient Active Problem List   Diagnosis Date Noted  . Acute CVA (cerebrovascular accident) ([place]) 05/02/2016  . Left-sided weakness 05/01/2016   Curtis Powell, PT, DPT   12/27/2019, 2:33 PM  [place] MAIN [facility] SERVICES [address] [place], [place], [address] Phone: [phone]   Fax:  [phone]  Name: Curtis Powell MRN: [id number] Date of Birth: 02-Jan-1966

## 2019-12-29 ENCOUNTER — Encounter: Payer: Self-pay | Admitting: Physical Therapy

## 2019-12-29 ENCOUNTER — Ambulatory Visit: Payer: Medicare HMO | Admitting: Physical Therapy

## 2019-12-29 ENCOUNTER — Encounter: Payer: Self-pay | Admitting: Occupational Therapy

## 2019-12-29 ENCOUNTER — Other Ambulatory Visit: Payer: Self-pay

## 2019-12-29 ENCOUNTER — Ambulatory Visit: Payer: Medicare HMO | Admitting: Occupational Therapy

## 2019-12-29 DIAGNOSIS — M6281 Muscle weakness (generalized): Secondary | ICD-10-CM

## 2019-12-29 DIAGNOSIS — R278 Other lack of coordination: Secondary | ICD-10-CM

## 2019-12-29 DIAGNOSIS — R2689 Other abnormalities of gait and mobility: Secondary | ICD-10-CM

## 2019-12-29 NOTE — Therapy (Signed)
[facility] SERVICES [address] [place], [place], [address] Phone: [phone]   Fax:  [phone]  Occupational Therapy Treatment  Patient Details  Name: Curtis Powell MRN: [id number] Date of Birth: [birth year] No data recorded  Encounter Date: 12/29/2019   OT End of Session - 12/29/19 1411    Visit Number [id number]    Number of Visits 258    Date for OT Re-Evaluation 01/10/20    Authorization Type Progress reporting period starting 09/15/2019    OT Start Time 1351    OT Stop Time 1430    OT Time Calculation (min) 39 min    Activity Tolerance Patient tolerated treatment well    Behavior During Therapy [facility] for tasks assessed/performed           Past Medical History:  Diagnosis Date  . GERD (gastroesophageal reflux disease)   . Hyperlipidemia   . Hypertension   . Obesity   . Stroke [facility])     Past Surgical History:  Procedure Laterality Date  . BRAIN SURGERY      There were no vitals filed for this visit.   Subjective Assessment - 12/29/19 1411    Subjective  Pt. is motivated    Patient is accompanied by: Curtis Powell    Pertinent History Pt. is a 54 y.o. male who suffered a CVA on 05/01/2016. Pt. was admitted to the hospital. Once discharged, he received Home Health PT and OT services for about a month. Pt. has had multiple CVAs over the past 8 years, and has had multiple falls in the past 6 months. Pt. resides in an apartment. Pt. has caregivers for 80 hours. Pt.'s mother stays with pt. at night, and assists with IADL tasks.      Patient Stated Goals To be able to throw a ball and dribble a ball, do as much as I can for myself.     Currently in Pain? No/denies          OT TREATMENT   Therapeutic Exercise:  Pt. performed4# dowel ex.for UE strengthening secondary to weakness. Bilateral shoulder flexion, chest press,andcircular patterns were performedfor 2 sets 10-20reps,3# dumbbell ex. for elbow flexion and  extension, forearm supination/pronation, and wrist flexion/extension. Pt. requires rest breaks and verbal cues for proper technique.1 sets 10-20reps each. Pt. worked on [employer] with the UBE seated with moderate resistance for 8 min.  Pt. reports that he is now able to reach into the refrigerator independently for items that he needs for snacks. Pt. continues to improve UE strength. Pt. was able to tolerate increased resistive exercises with increased weight. Pt. continues to work onBUE functional reaching.Pt. continues tomake progress overall.Pt.continues toimprovewith LUE strength, and overall [place] skills.Pt.requires verbal, and tactile cues for there. Ex. technique.Pt. continues to present with limited BUE strength, and [facility] skills. Pt.continues to work on improving UE strength, and [facility] skills in order to work towards maximizing independence with ADLs, and IADLtasks.                          OT Education - 12/29/19 1411    Education provided Yes    Person(s) Educated Patient    Methods Explanation;Demonstration    Comprehension Verbalized understanding;Returned demonstration;Verbal cues required            OT Short Term Goals - 03/03/17 1459      OT SHORT TERM GOAL #1   Title `  OT Long Term Goals - 12/01/19 1434      OT LONG TERM GOAL #4   Title Pt. will perform self dressing with minA and A/E as needed.    Baseline Pt. continues to be able to donn a T shirt independently with increased time. Min-modA donning a jacket, Mod A donning pants over his feet, and socks, minA with shoes.    Time 12    Period Weeks    Status Partially Met    Target Date 01/10/20      OT LONG TERM GOAL #6   Title Pt. will write his name efficiently with 100% legibility    Baseline Pt. continues to present with 75% legibility with increased time required when printing his name, and 50% legibility when cursive writing his name to sign in when  riding the [place]    Time 12    Period Weeks    Status On-going    Target Date 01/10/20      OT LONG TERM GOAL #7   Title Pt will independently and consistently follow HEP to increase UE strength to increase functional independence    Baseline Pt. continues to work towards independence with exercises    Time 12    Status On-going    Target Date 01/10/20      OT LONG TERM GOAL #8   Title Pt. will require supervision ironing a shirt.    Baseline Pt. has progressed CGA, and complete set-up seated using a tabletop iron    Time 12    Period Weeks    Status On-going    Target Date 01/10/20      OT LONG TERM GOAL  #9   Baseline Pt. will independently be able to sew a button onto a shirt.    Time 12    Period Weeks    Status On-going    Target Date 01/10/20      OT LONG TERM GOAL  #10   TITLE Pt. will independently be able to throw a ball    Baseline Pt. continues to be limited with throwing overhand, however pt. is able to perform underhand throwing.    Time 12    Status On-going    Target Date 01/10/20      OT LONG TERM GOAL  #11   TITLE Pt. will improve UE functional reaching to be able to independently use his RUE to hand his clothes in the closest.    Baseline Pt. continues to improve with reaching up with the RUE, however has difficulty reaching the hanger rack in the closet.    Time 12    Period Weeks    Target Date 01/10/20      OT LONG TERM GOAL  #13   TITLE Pt. will increase BUE strength to be able to prepare for assisting with yardwork.    Baseline Pt. BUE strength 4/5. Pt. reports that he has been able to get out to the front.    Time 12    Period Weeks    Status On-going                 Plan - 12/29/19 1416    Clinical Impression Statement Pt. reports that he is now able to reach into the refrigerator independently for items that he needs for snacks. Pt. continues to improve UE strength. Pt. was able to tolerate increased resistive exercises with increased  weight. Pt. continues to work onBUE functional reaching.Pt. continues tomake progress overall.Pt.continues toimprovewith LUE  strength, and overall [place] skills.Pt.requires verbal, and tactile cues for there. Ex. technique.Pt. continues to present with limited BUE strength, and [facility] skills. Pt.continues to work on improving UE strength, and [facility] skills in order to work towards maximizing independence with ADLs, and IADLtasks.      Occupational performance deficits (Please refer to evaluation for details): ADL's;IADL's    Body Structure / Function / Physical Skills ADL;FMC;ROM;IADL;Pain;Coordination    Rehab Potential Fair    Clinical Decision Making Several treatment options, min-mod task modification necessary    Comorbidities Affecting Occupational Performance: May have comorbidities impacting occupational performance    Modification or Assistance to Complete Evaluation  Min-Moderate modification of tasks or assist with assess necessary to complete eval    OT Frequency 2x / week    OT Duration 12 weeks    OT Treatment/Interventions Self-care/ADL training;Patient/Curtis education;DME and/or AE instruction;Therapeutic exercise;Neuromuscular education    Consulted and Agree with Plan of Care Patient           Patient will benefit from skilled therapeutic intervention in order to improve the following deficits and impairments:   Body Structure / Function / Physical Skills: ADL, FMC, ROM, IADL, Pain, Coordination       Visit Diagnosis: Muscle weakness (generalized)    Problem List Patient Active Problem List   Diagnosis Date Noted  . Acute CVA (cerebrovascular accident) ([place]) 05/02/2016  . Left-sided weakness 05/01/2016    Harrel Carina, MS, OTR/L 12/29/2019, 2:22 PM  [place] MAIN [facility] SERVICES [address] [place], [place], [address] Phone: [phone]   Fax:  [phone]  Name: SHIVAN HODES MRN: [id number] Date of  Birth: 04-04-1966

## 2019-12-29 NOTE — Therapy (Signed)
[place] MAIN [facility] SERVICES [address] [place], [place], [address] Phone: [phone]   Fax:  [phone]  Physical Therapy Treatment  Patient Details  Name: Curtis Powell MRN: [id number] Date of Birth: 11-02-1965 No data recorded  Encounter Date: 12/29/2019   PT End of Session - 12/30/19 1052    Visit Number [id number]    Number of Visits 229    Date for PT Re-Evaluation 01/12/20    Authorization Type 4/10 PN 9/8    PT Start Time 1432    PT Stop Time 1515    PT Time Calculation (min) 43 min    Equipment Utilized During Treatment Gait belt    Activity Tolerance Patient tolerated treatment well;No increased pain    Behavior During Therapy WFL for tasks assessed/performed           Past Medical History:  Diagnosis Date  . GERD (gastroesophageal reflux disease)   . Hyperlipidemia   . Hypertension   . Obesity   . Stroke [facility])     Past Surgical History:  Procedure Laterality Date  . BRAIN SURGERY      There were no vitals filed for this visit.   Subjective Assessment - 12/29/19 1443    Subjective Patient reports doing well; no pain; He is still wheezing/short of breath from humidity; No new falls;    Pertinent History  Patient is a pleasant 54 year old male who presents to physical therapy for weakness and immobility secondary to CVA.  Had a stroke in Feb 2018. He was previously fully independent, but this stroke caused severe residual deficits, mainly on the right side as well as speech, and now he is unable to walk or perform most of his ADLs on his own. Entire right side is very weak. He still has a little difficulty with speech but his swallowing is improved to baseline. His mom had to move in with him and now is his main caregiver.     Limitations Sitting;Lifting;Standing;Walking;House hold activities    Patient Stated Goals Walk without walker, walk further with walker, get some strength in back     Currently in Pain? No/denies     Multiple Pain Sites No                    Nustep Lvl 4 RPM> 70 for cardiovascular and musculoskeletal challenge x 4 minutes  Ambulating with RW: [id number],  CGA with RW, cues for foot clearance, upright posture, tactile cueing to PSIS and ASIS for body mechanics and sequencing;Able toturn with good mechanics keeping feet inside walker, one sitting rest break Pt does exhibit increased flexed posture and short step length with decreased foot clearance with prolonged ambulation and fatigue. Requires CGA with chair follow for safety;    Seated with # 5  ankle weights  -Seated marches with upright posture, back away from back of chair for abdominal/trunk activation/stabilization, 20x each LE -Seated LAQ with 3 second holds, 20x each LE, cueing for muscle activation and sequencing for neutral alignment education and performance of pushing foot rests away and together to stand up and sit down.  Pt educated throughout session about proper posture and technique with exercises. Improved exercise technique, movement at target joints, use of target muscles after min to mod verbal, visual, tactile cues   Vitals assessed, SPO2 97-98% with HR in low 100's following gait/exercise;     During session, patient required bathroom break which limited exercise progression due to time  constraints.  He reports mild fatigue at end of session; Denies any pain;                 PT Education - 12/30/19 1052    Education provided Yes    Education Details gait safety, LE strengthening, posture    Person(s) Educated Patient    Methods Explanation    Comprehension Verbalized understanding            PT Short Term Goals - 10/20/19 1722      PT SHORT TERM GOAL #1   Title Patient will be independent in home exercise program to improve strength/mobility for better functional independence with ADLs.    Baseline hep compliant    Time 2    Period Weeks     Status Achieved      PT SHORT TERM GOAL #2   Title Patient will require min cueing for STS transfer with CGA for increased independence with mobility.     Baseline CGA    Time 2    Period Weeks    Status Achieved      PT SHORT TERM GOAL #3   Title Patient will maintain upright posture for > 15 seconds to demonstrate strengthened postural control muscualture     Baseline 18 seconds     Time 2    Period Weeks    Status Achieved             PT Long Term Goals - 12/20/19 0001      PT LONG TERM GOAL #1   Title Patient will perform bed mobility (rolling and supine <>sit) with Mod I  to increase mobility and  perform ADLs.     Baseline 9/13: able to perform with mod I occasional Min A when very fatigued 7/14: Min A 4/21: unable to perform 6/15: improving but still min-mod A     Time 12    Period Weeks    Status Partially Met    Target Date 01/12/20      PT LONG TERM GOAL #2   Title Patient (< 59 years old) will complete five times sit to stand test in < 10 seconds indicating an increased LE strength and improved balance.    Baseline  9/13: 14 seconds with one near LOB 7/14: 14.31 seconds 6/21: 16.86 seconds 4/21: 18.8 seconds 06/02/19: 19.93 seconds one hand on RW, one hand on w/c 12/30: 16 secpmds BUE support 02/11/19: 36 seconds 5/19: 20.6s from [facility], one hand on walker and one on WC, 80% upright posture 7/1: 22 seconds one hand on walker one hand on w/c  9/14: 48 seconds with full upright posture    Time 12    Period Weeks    Status Partially Met    Target Date 01/12/20      PT LONG TERM GOAL #3   Title Patient will negotiate a bathroom with a walker and Min A for increased indep in home and external environment    Baseline  9/13: able to some in standing, requires sitting down to wash hands 7/14: unable to do     Time 12    Period Weeks    Status Partially Met    Target Date 01/12/20      PT LONG TERM GOAL #4   Title Patient will increase BLE gross strength to 4+/5 as to  improve functional strength for independent gait, increased standing tolerance and increased ADL ability.    Baseline  4/21: see note 06/02/19: hip  flexion 4-/5, extension/abd 3/5 12/30: hip flexion 4/5 hip extension and abduction 3/5 02/11/19: hip flexion 4/5 hip extension and abduction 2+/54-/5 RLE, 10/15: 4/5 RLE 11/7: 4/5 RLE 3/3: 4/5 7/1:  hip flexion 4/5 hip extension 2/5 hip abduction/adduciton 2/5     Time 12    Period Weeks    Status Partially Met    Target Date 01/12/20      PT LONG TERM GOAL #6   Title Patient will ambulate 200 ft with least assistive device and Supervision with no breaks to allow for increased mobility within home.    Baseline 9/13: performed in previous session 7/14: 190 ft with RW 4/21: ambulate 150 ft 6/21: had to terminate after 90 ft due to cardiac     Time 12    Period Weeks    Status Achieved    Target Date 01/12/20      PT LONG TERM GOAL #7   Title Patient will perform 10 MWT in <30 seconds for improved gait speed, gait mechanics, and functional capacity for mobility.     Baseline  9/13: terminated due to wardrobe malfunction 9/8: 42 seconds with R foot collapse 7/14: 37.98 seconds 6/21: 46.9 seconds with RW 07/28/19: 51 seconds with RW 06/02/19: 62.5 seconds with RW 11/4: 56 seconds with AFO and RW 12/30: 53 seconds with AFO and RW     Time 12    Period Weeks    Status Partially Met    Target Date 01/12/20      PT LONG TERM GOAL #8   Title Patient will increase Berg Balance score by > 6 points (38/56) to demonstrate decreased fall risk during functional activities.    Baseline 9/13: 31/56 7/14: 33/56 07/28/19: 32/56 6/21: 32/56     Time 12    Period Weeks    Status Partially Met    Target Date 01/12/20                 Plan - 12/30/19 1052    Clinical Impression Statement Patient motiviated and participated well within session. He does exhibit increased wheezing this session. Vitals monitored with SPO2 being WNL. Pt reports high humidity  contributes to his wheezing. He was instructed in gait training with cues for proper postural control and to increase step length. Patient was able to exhibit better erect posture and improved hip extension initially but with increased fatigue/prolonged ambulation starts to get flexed posture with short shuffled steps. Patient instructed in LE strengthening exercise with cues for proper positioning for optimal muscle activation. He would benefit from additional skilled PT intervention to improve strength, balance and mobility;    Rehab Potential Fair    Clinical Impairments Affecting Rehab Potential hx of HTN, HLD, CVA, learning disability, Diabetes, brain tumor,     PT Frequency 2x / week    PT Duration 12 weeks    PT Treatment/Interventions ADLs/Self Care Home Management;Aquatic Therapy;Ultrasound;Moist Heat;Traction;DME Instruction;Gait training;Stair training;Functional mobility training;Therapeutic activities;Therapeutic exercise;Orthotic Fit/Training;Neuromuscular re-education;Balance training;Patient/family education;Manual techniques;Wheelchair mobility training;Passive range of motion;Energy conservation;Taping;Visual/perceptual remediation/compensation    PT Next Visit Plan progress standing balance    PT Home Exercise Plan TrA contraction, glute sets: needs updates. Potentially some time in supine flat in hospital bed, to improve hip extension deficits.     Consulted and Agree with Plan of Care Patient           Patient will benefit from skilled therapeutic intervention in order to improve the following deficits and impairments:  Abnormal gait, Decreased activity  tolerance, Decreased balance, Decreased knowledge of precautions, Decreased endurance, Decreased coordination, Decreased knowledge of use of DME, Decreased mobility, Decreased range of motion, Difficulty walking, Decreased safety awareness, Decreased strength, Impaired flexibility, Impaired perceived functional ability, Impaired  tone, Postural dysfunction, Improper body mechanics, Pain  Visit Diagnosis: Muscle weakness (generalized)  Other lack of coordination  Other abnormalities of gait and mobility     Problem List Patient Active Problem List   Diagnosis Date Noted  . Acute CVA (cerebrovascular accident) ([place]) 05/02/2016  . Left-sided weakness 05/01/2016    Adrean Findlay PT, DPT 12/30/2019, 10:55 AM  [place] MAIN [facility] SERVICES [address] [place], [place], [address] Phone: [phone]   Fax:  [phone]  Name: Curtis Powell MRN: [id number] Date of Birth: 1965/08/25

## 2020-01-03 ENCOUNTER — Other Ambulatory Visit: Payer: Self-pay

## 2020-01-03 ENCOUNTER — Ambulatory Visit: Payer: Medicare HMO | Admitting: Occupational Therapy

## 2020-01-03 ENCOUNTER — Encounter: Payer: Self-pay | Admitting: Occupational Therapy

## 2020-01-03 ENCOUNTER — Ambulatory Visit: Payer: Medicare HMO

## 2020-01-03 DIAGNOSIS — R278 Other lack of coordination: Secondary | ICD-10-CM

## 2020-01-03 DIAGNOSIS — M6281 Muscle weakness (generalized): Secondary | ICD-10-CM

## 2020-01-03 DIAGNOSIS — I69351 Hemiplegia and hemiparesis following cerebral infarction affecting right dominant side: Secondary | ICD-10-CM

## 2020-01-03 DIAGNOSIS — R2689 Other abnormalities of gait and mobility: Secondary | ICD-10-CM

## 2020-01-03 NOTE — Therapy (Signed)
[place] MAIN [facility] SERVICES [address] [place], [place], [address] Phone: [phone]   Fax:  [phone]  Occupational Therapy Treatment  Patient Details  Name: Curtis Powell MRN: [id number] Date of Birth: 04-03-1966 No data recorded  Encounter Date: 01/03/2020   OT End of Session - 01/03/20 1436    Visit Number [id number]    Number of Visits 258    Date for OT Re-Evaluation 01/10/20    Authorization Type Progress reporting period starting 09/15/2019    OT Start Time 1433    OT Stop Time 1515    OT Time Calculation (min) 42 min    Activity Tolerance Patient tolerated treatment well    Behavior During Therapy [facility] for tasks assessed/performed           Past Medical History:  Diagnosis Date  . GERD (gastroesophageal reflux disease)   . Hyperlipidemia   . Hypertension   . Obesity   . Stroke [facility])     Past Surgical History:  Procedure Laterality Date  . BRAIN SURGERY      There were no vitals filed for this visit.   Subjective Assessment - 01/03/20 1434    Subjective  Pt. continues to be motivated    Patient is accompanied by: Family member    Pertinent History Pt. is a 54 y.o. male who suffered a CVA on 05/01/2016. Pt. was admitted to the hospital. Once discharged, he received Home Health PT and OT services for about a month. Pt. has had multiple CVAs over the past 8 years, and has had multiple falls in the past 6 months. Pt. resides in an apartment. Pt. has caregivers for 80 hours. Pt.'s mother stays with pt. at night, and assists with IADL tasks.      Patient Stated Goals To be able to throw a ball and dribble a ball, do as much as I can for myself.     Currently in Pain? Yes    Pain Score 5     Pain Location Pelvis    Pain Descriptors / Indicators Aching    Pain Type Acute pain          OT TREATMENT    Neuro muscular re-education:  Pt. worked on right hand [facility] skills manipulating resistive tweezers grasping thin sticks  from a horizontal, turning them to a vertical position, and placing them in a designated slot on a tweezer dexterity task. Pt. Had difficulty manipulating the pegs with the tweezers.    Therapeutic Exercise:   Pt. worked on the [place] in forward motion with minimal resistance for 8 min.   Pt. reports 5/10 pelvic pain today from possible UTI. Pt. Reports that he has been trying to drink more water, and cranberry juice. Pt plans to call his Physician. Pt. tolerated the BUE exercises well. Pt. had difficulty manipulating the tweezers when picking up the horizontal sticks, and moving them to a vertical position. The task was modified to be performed without the tweezers. Pt. continues to work on improving RUE strength, and [facility] skills in order to improve, and maximize independence with ADLs, and IADL tasks.                             OT Education - 01/03/20 1435    Education provided Yes    Education Details exercises, strengthening    Person(s) Educated Patient    Methods Explanation;Demonstration    Comprehension  Verbalized understanding;Returned demonstration;Verbal cues required            OT Short Term Goals - 03/03/17 1459      OT SHORT TERM GOAL #1   Title `             OT Long Term Goals - 12/01/19 1434      OT LONG TERM GOAL #4   Title Pt. will perform self dressing with minA and A/E as needed.    Baseline Pt. continues to be able to donn a T shirt independently with increased time. Min-modA donning a jacket, Mod A donning pants over his feet, and socks, minA with shoes.    Time 12    Period Weeks    Status Partially Met    Target Date 01/10/20      OT LONG TERM GOAL #6   Title Pt. will write his name efficiently with 100% legibility    Baseline Pt. continues to present with 75% legibility with increased time required when printing his name, and 50% legibility when cursive writing his name to sign in when riding the [place]    Time 12    Period  Weeks    Status On-going    Target Date 01/10/20      OT LONG TERM GOAL #7   Title Pt will independently and consistently follow HEP to increase UE strength to increase functional independence    Baseline Pt. continues to work towards independence with exercises    Time 12    Status On-going    Target Date 01/10/20      OT LONG TERM GOAL #8   Title Pt. will require supervision ironing a shirt.    Baseline Pt. has progressed CGA, and complete set-up seated using a tabletop iron    Time 12    Period Weeks    Status On-going    Target Date 01/10/20      OT LONG TERM GOAL  #9   Baseline Pt. will independently be able to sew a button onto a shirt.    Time 12    Period Weeks    Status On-going    Target Date 01/10/20      OT LONG TERM GOAL  #10   TITLE Pt. will independently be able to throw a ball    Baseline Pt. continues to be limited with throwing overhand, however pt. is able to perform underhand throwing.    Time 12    Status On-going    Target Date 01/10/20      OT LONG TERM GOAL  #11   TITLE Pt. will improve UE functional reaching to be able to independently use his RUE to hand his clothes in the closest.    Baseline Pt. continues to improve with reaching up with the RUE, however has difficulty reaching the hanger rack in the closet.    Time 12    Period Weeks    Target Date 01/10/20      OT LONG TERM GOAL  #13   TITLE Pt. will increase BUE strength to be able to prepare for assisting with yardwork.    Baseline Pt. BUE strength 4/5. Pt. reports that he has been able to get out to the front.    Time 12    Period Weeks    Status On-going                 Plan - 01/03/20 1437    Clinical Impression Statement Pt. reports  5/10 pelvic pain today from possible UTI. Pt. Reports that he has been trying to drink more water, and cranberry juice. Pt plans to call his Physician. Pt. tolerated the BUE exercises well. Pt. had difficulty manipulating the tweezers when  picking up the horizontal sticks, and moving them to a vertical position. The task was modified to be performed without the tweezers. Pt. continues to work on improving RUE strength, and [facility] skills in order to improve, and maximize independence with ADLs, and IADL tasks.   Occupational performance deficits (Please refer to evaluation for details): ADL's;IADL's    Body Structure / Function / Physical Skills ADL;FMC;ROM;IADL;Pain;Coordination    Rehab Potential Fair    Clinical Decision Making Several treatment options, min-mod task modification necessary    Comorbidities Affecting Occupational Performance: May have comorbidities impacting occupational performance    Modification or Assistance to Complete Evaluation  Min-Moderate modification of tasks or assist with assess necessary to complete eval    OT Frequency 2x / week    OT Duration 12 weeks    OT Treatment/Interventions Self-care/ADL training;Patient/family education;DME and/or AE instruction;Therapeutic exercise;Neuromuscular education    Consulted and Agree with Plan of Care Patient           Patient will benefit from skilled therapeutic intervention in order to improve the following deficits and impairments:   Body Structure / Function / Physical Skills: ADL, FMC, ROM, IADL, Pain, Coordination       Visit Diagnosis: Muscle weakness (generalized)  Other lack of coordination    Problem List Patient Active Problem List   Diagnosis Date Noted  . Acute CVA (cerebrovascular accident) ([place]) 05/02/2016  . Left-sided weakness 05/01/2016    Harrel Carina, MS, OTR/L 01/03/2020, 2:56 PM  [place] MAIN [facility] SERVICES [address] [place], [place], [address] Phone: [phone]   Fax:  [phone]  Name: Curtis Powell MRN: [id number] Date of Birth: 06/15/1965

## 2020-01-03 NOTE — Therapy (Signed)
[place] MAIN [facility] SERVICES [address] [place], [place], [address] Phone: [phone]   Fax:  [phone]  Physical Therapy Treatment  Patient Details  Name: Curtis Powell MRN: [id number] Date of Birth: 04/02/1966 No data recorded  Encounter Date: 01/03/2020   PT End of Session - 01/03/20 1336    Visit Number [id number]    Number of Visits 229    Date for PT Re-Evaluation 01/12/20    Authorization Type 5/10 PN 9/8    PT Start Time 1343    PT Stop Time 1428    PT Time Calculation (min) 45 min    Equipment Utilized During Treatment Gait belt    Activity Tolerance Patient tolerated treatment well;Patient limited by pain    Behavior During Therapy WFL for tasks assessed/performed           Past Medical History:  Diagnosis Date  . GERD (gastroesophageal reflux disease)   . Hyperlipidemia   . Hypertension   . Obesity   . Stroke [facility])     Past Surgical History:  Procedure Laterality Date  . BRAIN SURGERY      There were no vitals filed for this visit.   Subjective Assessment - 01/03/20 1335    Subjective [name] called prior to session beginning stating he has been having groin pain. Gave him some tylenol prior to PT session. No falls to be reported.    Pertinent History  Patient is a pleasant 54 year old male who presents to physical therapy for weakness and immobility secondary to CVA.  Had a stroke in Feb 2018. He was previously fully independent, but this stroke caused severe residual deficits, mainly on the right side as well as speech, and now he is unable to walk or perform most of his ADLs on his own. Entire right side is very weak. He still has a little difficulty with speech but his swallowing is improved to baseline. His mom had to move in with him and now is his main caregiver.     Limitations Sitting;Lifting;Standing;Walking;House hold activities    Patient Stated Goals Walk without walker, walk further with walker,  get some strength in back     Currently in Pain? Yes    Pain Score 4     Pain Location Groin    Pain Descriptors / Indicators Jabbing    Pain Type Acute pain            Patient reports the pain began this morning, is more internal feeling.     Ambulating with RW with w/c follow outside:     ambulate 60 ftx2,  CGA with RW, cues for foot clearance, upright posture, tactile cueing to PSIS and ASIS for body mechanics and sequencing; Able to turn with good mechanics keeping feet inside walker, one sitting rest break Patient able to clear feet to step over leaves.  Pt does exhibit increased flexed posture and short step length with decreased foot clearance with prolonged ambulation and fatigue. Requires CGA with chair follow for safety;       Stand with RW: Static stand no UE support 60 seconds no trunk sway  5lb ankle weight: -standing marches 10x each LE for 20x total -hip extension 10x each LE, cues for increasing range of step back for posterior chain strengthening  Seated with # 5  ankle weights  -Seated marches with upright posture, back away from back of chair for abdominal/trunk activation/stabilization, 20x each LE -Seated LAQ with  3 second holds, 15x each LE, cueing for muscle activation and sequencing for neutral alignment   education and performance of pushing foot rests away and together to stand up and sit down.       Pt educated throughout session about proper posture and technique with exercises. Improved exercise technique, movement at target joints, use of target muscles after min to mod verbal, visual, tactile cues  Vitals assessed, SPO2 97-98% with HR in low 100's following gait/exercise;                            PT Education - 01/03/20 1336    Education provided Yes    Education Details exercise technique, body mechanics    Person(s) Educated Patient    Methods Explanation;Demonstration;Tactile cues;Verbal cues    Comprehension  Verbalized understanding;Returned demonstration;Verbal cues required;Tactile cues required            PT Short Term Goals - 10/20/19 1722      PT SHORT TERM GOAL #1   Title Patient will be independent in home exercise program to improve strength/mobility for better functional independence with ADLs.    Baseline hep compliant    Time 2    Period Weeks    Status Achieved      PT SHORT TERM GOAL #2   Title Patient will require min cueing for STS transfer with CGA for increased independence with mobility.     Baseline CGA    Time 2    Period Weeks    Status Achieved      PT SHORT TERM GOAL #3   Title Patient will maintain upright posture for > 15 seconds to demonstrate strengthened postural control muscualture     Baseline 18 seconds     Time 2    Period Weeks    Status Achieved             PT Long Term Goals - 12/20/19 0001      PT LONG TERM GOAL #1   Title Patient will perform bed mobility (rolling and supine <>sit) with Mod I  to increase mobility and  perform ADLs.     Baseline 9/13: able to perform with mod I occasional Min A when very fatigued 7/14: Min A 4/21: unable to perform 6/15: improving but still min-mod A     Time 12    Period Weeks    Status Partially Met    Target Date 01/12/20      PT LONG TERM GOAL #2   Title Patient (< 68 years old) will complete five times sit to stand test in < 10 seconds indicating an increased LE strength and improved balance.    Baseline  9/13: 14 seconds with one near LOB 7/14: 14.31 seconds 6/21: 16.86 seconds 4/21: 18.8 seconds 06/02/19: 19.93 seconds one hand on RW, one hand on w/c 12/30: 16 secpmds BUE support 02/11/19: 36 seconds 5/19: 20.6s from [facility], one hand on walker and one on WC, 80% upright posture 7/1: 22 seconds one hand on walker one hand on w/c  9/14: 48 seconds with full upright posture    Time 12    Period Weeks    Status Partially Met    Target Date 01/12/20      PT LONG TERM GOAL #3   Title Patient will  negotiate a bathroom with a walker and Min A for increased indep in home and external environment    Baseline  9/13: able to some in standing, requires sitting down to wash hands 7/14: unable to do     Time 12    Period Weeks    Status Partially Met    Target Date 01/12/20      PT LONG TERM GOAL #4   Title Patient will increase BLE gross strength to 4+/5 as to improve functional strength for independent gait, increased standing tolerance and increased ADL ability.    Baseline  4/21: see note 06/02/19: hip flexion 4-/5, extension/abd 3/5 12/30: hip flexion 4/5 hip extension and abduction 3/5 02/11/19: hip flexion 4/5 hip extension and abduction 2+/54-/5 RLE, 10/15: 4/5 RLE 11/7: 4/5 RLE 3/3: 4/5 7/1:  hip flexion 4/5 hip extension 2/5 hip abduction/adduciton 2/5     Time 12    Period Weeks    Status Partially Met    Target Date 01/12/20      PT LONG TERM GOAL #6   Title Patient will ambulate 200 ft with least assistive device and Supervision with no breaks to allow for increased mobility within home.    Baseline 9/13: performed in previous session 7/14: 190 ft with RW 4/21: ambulate 150 ft 6/21: had to terminate after 90 ft due to cardiac     Time 12    Period Weeks    Status Achieved    Target Date 01/12/20      PT LONG TERM GOAL #7   Title Patient will perform 10 MWT in <30 seconds for improved gait speed, gait mechanics, and functional capacity for mobility.     Baseline  9/13: terminated due to wardrobe malfunction 9/8: 42 seconds with R foot collapse 7/14: 37.98 seconds 6/21: 46.9 seconds with RW 07/28/19: 51 seconds with RW 06/02/19: 62.5 seconds with RW 11/4: 56 seconds with AFO and RW 12/30: 53 seconds with AFO and RW     Time 12    Period Weeks    Status Partially Met    Target Date 01/12/20      PT LONG TERM GOAL #8   Title Patient will increase Berg Balance score by > 6 points (38/56) to demonstrate decreased fall risk during functional activities.    Baseline 9/13: 31/56 7/14:  33/56 07/28/19: 32/56 6/21: 32/56     Time 12    Period Weeks    Status Partially Met    Target Date 01/12/20                 Plan - 01/03/20 1421    Clinical Impression Statement Patient's pain does not limit session however is monitored throughout session and water breaks utilized due to report of water improving pain levels. Negotiation of outside terrain is very fatiguing to patient and will continue to be an area of improvement. Monitoring of vitals required to maintain therapeutic range throughout session. He would benefit from additional skilled PT intervention to improve strength, balance and mobility;    Rehab Potential Fair    Clinical Impairments Affecting Rehab Potential hx of HTN, HLD, CVA, learning disability, Diabetes, brain tumor,     PT Frequency 2x / week    PT Duration 12 weeks    PT Treatment/Interventions ADLs/Self Care Home Management;Aquatic Therapy;Ultrasound;Moist Heat;Traction;DME Instruction;Gait training;Stair training;Functional mobility training;Therapeutic activities;Therapeutic exercise;Orthotic Fit/Training;Neuromuscular re-education;Balance training;Patient/family education;Manual techniques;Wheelchair mobility training;Passive range of motion;Energy conservation;Taping;Visual/perceptual remediation/compensation    PT Next Visit Plan progress standing balance    PT Home Exercise Plan TrA contraction, glute sets: needs updates. Potentially some time in supine flat in hospital  bed, to improve hip extension deficits.     Consulted and Agree with Plan of Care Patient           Patient will benefit from skilled therapeutic intervention in order to improve the following deficits and impairments:  Abnormal gait, Decreased activity tolerance, Decreased balance, Decreased knowledge of precautions, Decreased endurance, Decreased coordination, Decreased knowledge of use of DME, Decreased mobility, Decreased range of motion, Difficulty walking, Decreased safety  awareness, Decreased strength, Impaired flexibility, Impaired perceived functional ability, Impaired tone, Postural dysfunction, Improper body mechanics, Pain  Visit Diagnosis: Muscle weakness (generalized)  Other lack of coordination  Other abnormalities of gait and mobility  Hemiplegia and hemiparesis following cerebral infarction affecting right dominant side [facility])     Problem List Patient Active Problem List   Diagnosis Date Noted  . Acute CVA (cerebrovascular accident) ([place]) 05/02/2016  . Left-sided weakness 05/01/2016   Janna Arch, PT, DPT   01/03/2020, 2:34 PM  [place] MAIN [facility] SERVICES [address] [place], [place], [address] Phone: [phone]   Fax:  [phone]  Name: Curtis Powell MRN: [id number] Date of Birth: Sep 27, 1965

## 2020-01-05 ENCOUNTER — Ambulatory Visit: Payer: Medicare HMO | Admitting: Occupational Therapy

## 2020-01-05 ENCOUNTER — Ambulatory Visit: Payer: Medicare HMO

## 2020-01-10 ENCOUNTER — Other Ambulatory Visit: Payer: Self-pay

## 2020-01-10 ENCOUNTER — Ambulatory Visit: Payer: Medicare HMO

## 2020-01-10 ENCOUNTER — Encounter: Payer: Self-pay | Admitting: Occupational Therapy

## 2020-01-10 ENCOUNTER — Ambulatory Visit: Payer: Medicare HMO | Attending: Family Medicine | Admitting: Occupational Therapy

## 2020-01-10 DIAGNOSIS — R278 Other lack of coordination: Secondary | ICD-10-CM

## 2020-01-10 DIAGNOSIS — R2689 Other abnormalities of gait and mobility: Secondary | ICD-10-CM | POA: Diagnosis present

## 2020-01-10 DIAGNOSIS — I69351 Hemiplegia and hemiparesis following cerebral infarction affecting right dominant side: Secondary | ICD-10-CM | POA: Insufficient documentation

## 2020-01-10 DIAGNOSIS — R531 Weakness: Secondary | ICD-10-CM | POA: Insufficient documentation

## 2020-01-10 DIAGNOSIS — I639 Cerebral infarction, unspecified: Secondary | ICD-10-CM | POA: Insufficient documentation

## 2020-01-10 DIAGNOSIS — M6281 Muscle weakness (generalized): Secondary | ICD-10-CM | POA: Insufficient documentation

## 2020-01-10 NOTE — Therapy (Signed)
[place] MAIN [facility] SERVICES [address] [place], [place], [address] Phone: [phone]   Fax:  [phone]  Physical Therapy Treatment  Patient Details  Name: Curtis Powell MRN: [id number] Date of Birth: 1965/10/02 No data recorded  Encounter Date: 01/10/2020   PT End of Session - 01/10/20 1411    Visit Number 226    Number of Visits 229    Date for PT Re-Evaluation 01/12/20    PT Start Time 7371    PT Stop Time 1425    PT Time Calculation (min) 40 min    Activity Tolerance Patient tolerated treatment well;No increased pain    Behavior During Therapy WFL for tasks assessed/performed           Past Medical History:  Diagnosis Date  . GERD (gastroesophageal reflux disease)   . Hyperlipidemia   . Hypertension   . Obesity   . Stroke [facility])     Past Surgical History:  Procedure Laterality Date  . BRAIN SURGERY      There were no vitals filed for this visit.   Subjective Assessment - 01/10/20 1404    Subjective Pt doing well today, had a good weekend. Pt says he continues to have 'groin pain' but upon further discussion/palpation, seems that pain is actually more in the scrotal area. Pain worse at night when supine. Pt unable to say whether he is having new scotal mass effect or scrotal edema. He is seeing his PCP about this problem sometime next week.    Pertinent History  Patient is a pleasant 54 year old male who presents to physical therapy for weakness and immobility secondary to CVA.  Had a stroke in Feb 2018. He was previously fully independent, but this stroke caused severe residual deficits, mainly on the right side as well as speech, and now he is unable to walk or perform most of his ADLs on his own. Entire right side is very weak. He still has a little difficulty with speech but his swallowing is improved to baseline. His mom had to move in with him and now is his main caregiver.     Currently in Pain? No/denies             INTERVENTION THIS DATE:  -AMB intervals 3x22f supervision; 2-3 minutes per interval, increased Left foot toe drag during first interval  -STS from chair during gait x3, minGuard to minA for balance acquisition (lift assist when no chair arms available) -STS from chair c RW 1x10, no LOB, no loss of power, pt conversational -standing back to wall, trunk extension with manual overpressure of scapular retractions 15x1secH      PT Short Term Goals - 10/20/19 1722      PT SHORT TERM GOAL #1   Title Patient will be independent in home exercise program to improve strength/mobility for better functional independence with ADLs.    Baseline hep compliant    Time 2    Period Weeks    Status Achieved      PT SHORT TERM GOAL #2   Title Patient will require min cueing for STS transfer with CGA for increased independence with mobility.     Baseline CGA    Time 2    Period Weeks    Status Achieved      PT SHORT TERM GOAL #3   Title Patient will maintain upright posture for > 15 seconds to demonstrate strengthened postural control muscualture     Baseline 18  seconds     Time 2    Period Weeks    Status Achieved             PT Long Term Goals - 12/20/19 0001      PT LONG TERM GOAL #1   Title Patient will perform bed mobility (rolling and supine <>sit) with Mod I  to increase mobility and  perform ADLs.     Baseline 9/13: able to perform with mod I occasional Min A when very fatigued 7/14: Min A 4/21: unable to perform 6/15: improving but still min-mod A     Time 12    Period Weeks    Status Partially Met    Target Date 01/12/20      PT LONG TERM GOAL #2   Title Patient (< 65 years old) will complete five times sit to stand test in < 10 seconds indicating an increased LE strength and improved balance.    Baseline  9/13: 14 seconds with one near LOB 7/14: 14.31 seconds 6/21: 16.86 seconds 4/21: 18.8 seconds 06/02/19: 19.93 seconds one hand on RW, one hand on w/c 12/30: 16 secpmds  BUE support 02/11/19: 36 seconds 5/19: 20.6s from [facility], one hand on walker and one on WC, 80% upright posture 7/1: 22 seconds one hand on walker one hand on w/c  9/14: 48 seconds with full upright posture    Time 12    Period Weeks    Status Partially Met    Target Date 01/12/20      PT LONG TERM GOAL #3   Title Patient will negotiate a bathroom with a walker and Min A for increased indep in home and external environment    Baseline  9/13: able to some in standing, requires sitting down to wash hands 7/14: unable to do     Time 12    Period Weeks    Status Partially Met    Target Date 01/12/20      PT LONG TERM GOAL #4   Title Patient will increase BLE gross strength to 4+/5 as to improve functional strength for independent gait, increased standing tolerance and increased ADL ability.    Baseline  4/21: see note 06/02/19: hip flexion 4-/5, extension/abd 3/5 12/30: hip flexion 4/5 hip extension and abduction 3/5 02/11/19: hip flexion 4/5 hip extension and abduction 2+/54-/5 RLE, 10/15: 4/5 RLE 11/7: 4/5 RLE 3/3: 4/5 7/1:  hip flexion 4/5 hip extension 2/5 hip abduction/adduciton 2/5     Time 12    Period Weeks    Status Partially Met    Target Date 01/12/20      PT LONG TERM GOAL #6   Title Patient will ambulate 200 ft with least assistive device and Supervision with no breaks to allow for increased mobility within home.    Baseline 9/13: performed in previous session 7/14: 190 ft with RW 4/21: ambulate 150 ft 6/21: had to terminate after 90 ft due to cardiac     Time 12    Period Weeks    Status Achieved    Target Date 01/12/20      PT LONG TERM GOAL #7   Title Patient will perform 10 MWT in <30 seconds for improved gait speed, gait mechanics, and functional capacity for mobility.     Baseline  9/13: terminated due to wardrobe malfunction 9/8: 42 seconds with R foot collapse 7/14: 37.98 seconds 6/21: 46.9 seconds with RW 07/28/19: 51 seconds with RW 06/02/19: 62.5 seconds with RW 11/4: 56  seconds with AFO and RW 12/30: 53 seconds with AFO and RW     Time 12    Period Weeks    Status Partially Met    Target Date 01/12/20      PT LONG TERM GOAL #8   Title Patient will increase Berg Balance score by > 6 points (38/56) to demonstrate decreased fall risk during functional activities.    Baseline 9/13: 31/56 7/14: 33/56 07/28/19: 32/56 6/21: 32/56     Time 12    Period Weeks    Status Partially Met    Target Date 01/12/20                 Plan - 01/10/20 1412    Clinical Impression Statement Began with AMB intervals, pt limited at around 40-69f per interval, cites fatigue of Left hip flexors. Pt reports he has not been as active with AMB at home with his aide since he sustained a fall several months ago. Pt remains focused and motivated, genrally good control of his compensated motor patterns.    Rehab Potential Fair    Clinical Impairments Affecting Rehab Potential hx of HTN, HLD, CVA, learning disability, Diabetes, brain tumor,     PT Frequency 2x / week    PT Duration 12 weeks    PT Treatment/Interventions ADLs/Self Care Home Management;Aquatic Therapy;Ultrasound;Moist Heat;Traction;DME Instruction;Gait training;Stair training;Functional mobility training;Therapeutic activities;Therapeutic exercise;Orthotic Fit/Training;Neuromuscular re-education;Balance training;Patient/family education;Manual techniques;Wheelchair mobility training;Passive range of motion;Energy conservation;Taping;Visual/perceptual remediation/compensation    PT Next Visit Plan progress standing balance    PT Home Exercise Plan TrA contraction, glute sets: needs updates. Potentially some time in supine flat in hospital bed, to improve hip extension deficits.     Consulted and Agree with Plan of Care Patient    Family Member Consulted mother           Patient will benefit from skilled therapeutic intervention in order to improve the following deficits and impairments:  Abnormal gait, Decreased  activity tolerance, Decreased balance, Decreased knowledge of precautions, Decreased endurance, Decreased coordination, Decreased knowledge of use of DME, Decreased mobility, Decreased range of motion, Difficulty walking, Decreased safety awareness, Decreased strength, Impaired flexibility, Impaired perceived functional ability, Impaired tone, Postural dysfunction, Improper body mechanics, Pain  Visit Diagnosis: Muscle weakness (generalized)  Other lack of coordination  Other abnormalities of gait and mobility  Hemiplegia and hemiparesis following cerebral infarction affecting right dominant side ([facility]     Problem List Patient Active Problem List   Diagnosis Date Noted  . [place]) [place]  . Left-sided weakness 05/01/2016   2:28 PM, 01/10/20 AEtta Grandchild PT, DPT Physical Therapist - [facility] [email or web address] 01/10/2020, 2:21 PM  [place] [facility] [address] [address]Phone: [phone]  Fax:  [phone] Name: BSULTAN PARGASMRN: [id number] of Birth: 902-13-1967

## 2020-01-10 NOTE — Therapy (Signed)
[place] MAIN [facility] SERVICES [address] [place], [place], [address] Phone: [phone]   Fax:  [phone]  Occupational Therapy Treatment/Recertifiecation/Occupational Therapy Progress Note  Dates of reporting period  09/15/2019   to   01/10/2020  Patient Details  Name: Curtis Powell MRN: [id number] Date of Birth: 1965/08/28 No data recorded  Encounter Date: 01/10/2020   OT End of Session - 01/10/20 1311    Visit Number [id number]    Number of Visits 258    Date for OT Re-Evaluation 04/03/20    Authorization Type Progress reporting period starting 09/15/2019    OT Start Time 1304    OT Stop Time 1345    OT Time Calculation (min) 41 min    Activity Tolerance Patient tolerated treatment well    Behavior During Therapy [facility] for tasks assessed/performed           Past Medical History:  Diagnosis Date  . GERD (gastroesophageal reflux disease)   . Hyperlipidemia   . Hypertension   . Obesity   . Stroke [facility])     Past Surgical History:  Procedure Laterality Date  . BRAIN SURGERY      There were no vitals filed for this visit.   Subjective Assessment - 01/10/20 1310    Subjective  Pt. reports that his aunt cma this weekend.    Patient is accompanied by: Family member    Pertinent History Pt. is a 54 y.o. male who suffered a CVA on 05/01/2016. Pt. was admitted to the hospital. Once discharged, he received Home Health PT and OT services for about a month. Pt. has had multiple CVAs over the past 8 years, and has had multiple falls in the past 6 months. Pt. resides in an apartment. Pt. has caregivers for 80 hours. Pt.'s mother stays with pt. at night, and assists with IADL tasks.      Patient Stated Goals To be able to throw a ball and dribble a ball, do as much as I can for myself.     Currently in Pain? Yes           OT TREATMENT    Therapeutic Exercise:  Pt. performed 3.5# dowel ex. For UE strengthening secondary to weakness.  Bilateral shoulder flexion, chest press, and circular patterns were performed  Goals were reviewed, and updated with the pt. Pt. has made progress overall, and is now able to consistently assist during more tasks at home. Pt. is able to reach into his refrigerator for items needed for light meal preparation. Pt. is able to prepare himself a sandwich, and snacks. Pt. is able to use a reacher to reach into the cabinetry/closets.  Pt. has been assisting more with home management tasks. Pt. has difficulty handling a broom, and an upright dustpan for thorough removal of items on the floor, managing briefs over his feet, and performing ADLs, and IADL tasks while in prolonged standing. Pt. continues to work on improving UE functioning, strength, and [facility] skills in preparation for improving overall ADLs, and IADL tasks.                       OT Education - 01/10/20 1311    Education provided Yes    Person(s) Educated Patient    Methods Explanation;Demonstration    Comprehension Verbalized understanding;Returned demonstration;Verbal cues required            OT Short Term Goals - 03/03/17 1459  OT SHORT TERM GOAL #1   Title `             OT Long Term Goals - 01/10/20 1316      OT LONG TERM GOAL #1   Title Pt. will UE functioning to be able to perfrom toilet hygiene with minimal assistance.      OT LONG TERM GOAL #4   Title Pt. will perform self dressing with minA and A/E as needed.    Baseline Pt. continues to be able to donn a T shirt independently with increased time. MinA donning a jacket, Mod A donning pants over his feet, and socks, minA with shoes.    Time 12    Period Weeks    Status On-going    Target Date 04/03/20      OT LONG TERM GOAL #5   Title Pt. will perform light home making tasks with minA    Baseline Pt is able to wash dishes while seated in w/c with increased accessibility of new apartment. Pt. is able to fold his clothes, his moms puts them in  the and out of the washer, and dryer, and puts them onto hangers. Caregivers perform vacuuming, and dusting.    Time 12    Period Weeks      Long Term Additional Goals   Additional Long Term Goals Yes      OT LONG TERM GOAL #6   Title Pt. will write his name efficiently with 100% legibility    Baseline Pt. continues to present with 75% legibility with increased time required when printing his name, and 50% legibility when cursive writing his name to sign in when riding the [place]    Time 12    Period Weeks    Target Date 04/03/20      OT LONG TERM GOAL #7   Title Pt will independently and consistently follow HEP to increase UE strength to increase functional independence    Baseline Pt. continues to work towards independence with exercises    Time 12    Period Weeks    Target Date 04/03/20      OT LONG TERM GOAL #8   Title Pt. will require supervision ironing a shirt.    Baseline Pt. has progressed CGA, and complete set-up seated using a tabletop iron    Time 12    Period Weeks    Status On-going    Target Date 04/03/20      OT LONG TERM GOAL  #9   Baseline Pt. will independently be able to sew a button onto a shirt.    Time 12    Period Weeks    Target Date 04/03/20      OT LONG TERM GOAL  #10   TITLE Pt. will independently be able to throw a ball    Baseline Pt. continues to be limited with throwing overhand, however pt. is able to perform underhand throwing.    Time 12    Period Weeks    Status On-going    Target Date 04/03/20      OT LONG TERM GOAL  #11   TITLE Pt. will improve UE functional reaching to be able to independently use his RUE to hand his clothes in the closest.    Baseline Pt. continues to improve with reaching up with the RUE, however has difficulty reaching the hanger rack in the closet. Pt. now uses a reacher.    Time 12    Period Weeks  Status On-going      OT LONG TERM GOAL  #13   TITLE Pt. will increase BUE strength to be able to prepare for  assisting with yardwork.    Baseline Pt. BUE strength 4+/5 Pt. reports that he has been able to get out to the front.    Time 12    Period Weeks    Status On-going      OT LONG TERM GOAL  #14   TITLE Pt. will improve activity tolerance in standing to be able to complete standing ADLs, and IADLs with SBA.    Baseline Pt. is limited with ADL performance in standing.    Time 12    Period Weeks    Status New    Target Date 04/03/20                 Plan - 01/10/20 1312    Clinical Impression Statement Goals were reviewed, and updated with the pt. Pt. has made progress overall, and is now able to consistently assist during more tasks at home. Pt. is able to reach into his refrigerator for items needed for light meal preparation. Pt. is able to prepare himself a sandwich, and snacks. Pt. is able to use a reacher to reach into the cabinetry/closets.  Pt. has been assisting more with home management tasks. Pt. has difficulty handling a broom, and an upright dustpan for thorough removal of items on the floor, managing briefs over his feet, and performing ADLs, and IADL tasks while in prolonged standing. Pt. continues to work on improving UE functioning, strength, and [facility] skills in preparation for improving overall ADLs, and IADL tasks.   OT Occupational Profile and History Problem Focused Assessment - Including review of records relating to presenting problem    Occupational performance deficits (Please refer to evaluation for details): ADL's;IADL's    Body Structure / Function / Physical Skills ADL;FMC;ROM;IADL;Pain;Coordination    Rehab Potential Fair    Clinical Decision Making Several treatment options, min-mod task modification necessary    Comorbidities Affecting Occupational Performance: May have comorbidities impacting occupational performance    Modification or Assistance to Complete Evaluation  Min-Moderate modification of tasks or assist with assess necessary to complete eval    OT  Frequency 2x / week    OT Duration 12 weeks    OT Treatment/Interventions Self-care/ADL training;Patient/family education;DME and/or AE instruction;Therapeutic exercise;Neuromuscular education    Consulted and Agree with Plan of Care Patient           Patient will benefit from skilled therapeutic intervention in order to improve the following deficits and impairments:   Body Structure / Function / Physical Skills: ADL, FMC, ROM, IADL, Pain, Coordination       Visit Diagnosis: Muscle weakness (generalized)  Other lack of coordination    Problem List Patient Active Problem List   Diagnosis Date Noted  . Acute CVA (cerebrovascular accident) ([place]) 05/02/2016  . Left-sided weakness 05/01/2016    Harrel Carina, MS, OTR/L 01/10/2020, 1:41 PM  [place] MAIN [facility] SERVICES [address] [place], [place], [address] Phone: [phone]   Fax:  [phone]  Name: Curtis Powell MRN: [id number] Date of Birth: Sep 25, 1965

## 2020-01-12 ENCOUNTER — Other Ambulatory Visit: Payer: Self-pay

## 2020-01-12 ENCOUNTER — Encounter: Payer: Self-pay | Admitting: Occupational Therapy

## 2020-01-12 ENCOUNTER — Ambulatory Visit: Payer: Medicare HMO | Admitting: Occupational Therapy

## 2020-01-12 ENCOUNTER — Ambulatory Visit: Payer: Medicare HMO | Admitting: Physical Therapy

## 2020-01-12 DIAGNOSIS — R531 Weakness: Secondary | ICD-10-CM

## 2020-01-12 DIAGNOSIS — M6281 Muscle weakness (generalized): Secondary | ICD-10-CM

## 2020-01-12 DIAGNOSIS — R278 Other lack of coordination: Secondary | ICD-10-CM

## 2020-01-12 DIAGNOSIS — R2689 Other abnormalities of gait and mobility: Secondary | ICD-10-CM

## 2020-01-12 DIAGNOSIS — I69351 Hemiplegia and hemiparesis following cerebral infarction affecting right dominant side: Secondary | ICD-10-CM

## 2020-01-12 DIAGNOSIS — I639 Cerebral infarction, unspecified: Secondary | ICD-10-CM

## 2020-01-12 NOTE — Therapy (Signed)
[place] MAIN [facility] SERVICES [address] [place], [place], [address] Phone: [phone]   Fax:  [phone]  Occupational Therapy Treatment  Patient Details  Name: Curtis Powell MRN: [id number] Date of Birth: [birth year] No data recorded  Encounter Date: 01/12/2020   OT End of Session - 01/12/20 1412    Visit Number [id number]    Number of Visits 258    Date for OT Re-Evaluation 04/03/20    Authorization Type Progress reporting period starting 09/15/2019    OT Start Time 1355    OT Stop Time 1430    OT Time Calculation (min) 35 min    Activity Tolerance Patient tolerated treatment well    Behavior During Therapy [facility] for tasks assessed/performed           Past Medical History:  Diagnosis Date  . GERD (gastroesophageal reflux disease)   . Hyperlipidemia   . Hypertension   . Obesity   . Stroke [facility])     Past Surgical History:  Procedure Laterality Date  . BRAIN SURGERY      There were no vitals filed for this visit.   Subjective Assessment - 01/12/20 1410    Subjective  Pt. reports that her aunt is no longer staying with him, and went home.    Patient is accompanied by: Family member    Pertinent History Pt. is a 54 y.o. male who suffered a CVA on 05/01/2016. Pt. was admitted to the hospital. Once discharged, he received Home Health PT and OT services for about a month. Pt. has had multiple CVAs over the past 8 years, and has had multiple falls in the past 6 months. Pt. resides in an apartment. Pt. has caregivers for 80 hours. Pt.'s mother stays with pt. at night, and assists with IADL tasks.      Currently in Pain? No/denies           OT TREATMENT    Self-care:  Pt. worked on self-dressing skills donning, and doffing a zip down Museum/gallery conservator. After several tries pt. was able to doff the jacket independently. Pt. required minA to donn the jacket, and minA at times to navigate the jacket, and reposition it rightside  out.  Therapeutic Exercise:  Pt. performed 4# dowel ex. For UE strengthening secondary to weakness. Bilateral shoulder flexion, chest press, circular patterns, and elbow flexion/extension were performed 2 sets 10-20 reps each.  Pt. continues to make excellent progress overall. Pt. continues to do more tasks at home, and engage his UE's during ADLs, and IADL tasks.  Pt. requires minA to donn, and is independent doffing his jacket. Pt. requires significant cues to position the jacket right side out.  Pt. continues to work on improving UE functioning in order to improve, and maximize independence with ADLs, and IADLs.                       OT Education - 01/12/20 1412    Education provided Yes    Education Details exercises, strengthening    Person(s) Educated Patient    Methods Explanation;Demonstration    Comprehension Verbalized understanding;Returned demonstration;Verbal cues required            OT Short Term Goals - 03/03/17 1459      OT SHORT TERM GOAL #1   Title `             OT Long Term Goals - 01/10/20 1316      OT  LONG TERM GOAL #1   Title Pt. will UE functioning to be able to perfrom toilet hygiene with minimal assistance.      OT LONG TERM GOAL #4   Title Pt. will perform self dressing with minA and A/E as needed.    Baseline Pt. continues to be able to donn a T shirt independently with increased time. MinA donning a jacket, Mod A donning pants over his feet, and socks, minA with shoes.    Time 12    Period Weeks    Status On-going    Target Date 04/03/20      OT LONG TERM GOAL #5   Title Pt. will perform light home making tasks with minA    Baseline Pt is able to wash dishes while seated in w/c with increased accessibility of new apartment. Pt. is able to fold his clothes, his moms puts them in the and out of the washer, and dryer, and puts them onto hangers. Caregivers perform vacuuming, and dusting.    Time 12    Period Weeks      Long  Term Additional Goals   Additional Long Term Goals Yes      OT LONG TERM GOAL #6   Title Pt. will write his name efficiently with 100% legibility    Baseline Pt. continues to present with 75% legibility with increased time required when printing his name, and 50% legibility when cursive writing his name to sign in when riding the [employer]    Time 12    Period Weeks    Target Date 04/03/20      OT LONG TERM GOAL #7   Title Pt will independently and consistently follow HEP to increase UE strength to increase functional independence    Baseline Pt. continues to work towards independence with exercises    Time 12    Period Weeks    Target Date 04/03/20      OT LONG TERM GOAL #8   Title Pt. will require supervision ironing a shirt.    Baseline Pt. has progressed CGA, and complete set-up seated using a tabletop iron    Time 12    Period Weeks    Status On-going    Target Date 04/03/20      OT LONG TERM GOAL  #9   Baseline Pt. will independently be able to sew a button onto a shirt.    Time 12    Period Weeks    Target Date 04/03/20      OT LONG TERM GOAL  #10   TITLE Pt. will independently be able to throw a ball    Baseline Pt. continues to be limited with throwing overhand, however pt. is able to perform underhand throwing.    Time 12    Period Weeks    Status On-going    Target Date 04/03/20      OT LONG TERM GOAL  #11   TITLE Pt. will improve UE functional reaching to be able to independently use his RUE to hand his clothes in the closest.    Baseline Pt. continues to improve with reaching up with the RUE, however has difficulty reaching the hanger rack in the closet. Pt. now uses a reacher.    Time 12    Period Weeks    Status On-going      OT LONG TERM GOAL  #13   TITLE Pt. will increase BUE strength to be able to prepare for assisting with yardwork.  Baseline Pt. BUE strength 4+/5 Pt. reports that he has been able to get out to the front.    Time 12    Period Weeks     Status On-going      OT LONG TERM GOAL  #14   TITLE Pt. will improve activity toelrance in standing to be able to complete standing ADLs, and IADLs with SBA.    Baseline Pt. is limited with ADL performance in standing.    Time 12    Period Weeks    Status New    Target Date 04/03/20                 Plan - 01/12/20 1412    Clinical Impression Statement Pt. continues to make excellent progress overall. Pt. continues to do more tasks at home, and engage his UE's during ADLs, and IADL tasks.  Pt. requires minA to donn, and is independent doffing his jacket. Pt. requires significant cues to position the jacket right side out.  Pt. continues to work on improving UE functioning in order to improve, and maximize independence with ADLs, and IADLs.   OT Occupational Profile and History Problem Focused Assessment - Including review of records relating to presenting problem    Occupational performance deficits (Please refer to evaluation for details): ADL's;IADL's    Body Structure / Function / Physical Skills ADL;FMC;ROM;IADL;Pain;Coordination    Rehab Potential Fair    Clinical Decision Making Several treatment options, min-mod task modification necessary    Comorbidities Affecting Occupational Performance: May have comorbidities impacting occupational performance    Modification or Assistance to Complete Evaluation  Min-Moderate modification of tasks or assist with assess necessary to complete eval    OT Frequency 2x / week    OT Duration 12 weeks    OT Treatment/Interventions Self-care/ADL training;Patient/family education;DME and/or AE instruction;Therapeutic exercise;Neuromuscular education    Consulted and Agree with Plan of Care Patient           Patient will benefit from skilled therapeutic intervention in order to improve the following deficits and impairments:   Body Structure / Function / Physical Skills: ADL, FMC, ROM, IADL, Pain, Coordination       Visit  Diagnosis: Muscle weakness (generalized)  Other lack of coordination    Problem List Patient Active Problem List   Diagnosis Date Noted  . Acute CVA (cerebrovascular accident) ([place]) 05/02/2016  . Left-sided weakness 05/01/2016    Harrel Carina, MS, OTR/L 01/12/2020, 2:17 PM  [place] MAIN [facility] SERVICES [address] [place], [place], [address] Phone: [phone]   Fax:  [phone]  Name: MONTRAIL MEHRER MRN: [id number] Date of Birth: 1965/10/15

## 2020-01-12 NOTE — Therapy (Signed)
[place] MAIN [facility] SERVICES [address] [place], [place], [address] Phone: [phone]   Fax:  [phone]  Physical Therapy Treatment  Patient Details  Name: Curtis Powell MRN: [id number] Date of Birth: 1966-01-15 No data recorded  Encounter Date: 01/12/2020   PT End of Session - 01/12/20 1531    Visit Number [id number]    Number of Visits 229    Date for PT Re-Evaluation 01/12/20    Authorization Type 5/10 PN 9/8    Authorization Time Period authorized 11/16/18-05/19/19    Authorization - Number of Visits 10    PT Start Time 1430    PT Stop Time 1513    PT Time Calculation (min) 43 min    Equipment Utilized During Treatment Gait belt    Activity Tolerance Patient tolerated treatment well;No increased pain    Behavior During Therapy WFL for tasks assessed/performed           Past Medical History:  Diagnosis Date  . GERD (gastroesophageal reflux disease)   . Hyperlipidemia   . Hypertension   . Obesity   . Stroke [facility])     Past Surgical History:  Procedure Laterality Date  . BRAIN SURGERY      There were no vitals filed for this visit.   Subjective Assessment - 01/12/20 1529    Subjective Patient reports that he is doing well today.    Pertinent History  Patient is a pleasant 54 year old male who presents to physical therapy for weakness and immobility secondary to CVA.  Had a stroke in Feb 2018. He was previously fully independent, but this stroke caused severe residual deficits, mainly on the right side as well as speech, and now he is unable to walk or perform most of his ADLs on his own. Entire right side is very weak. He still has a little difficulty with speech but his swallowing is improved to baseline. His mom had to move in with him and now is his main caregiver.     Limitations Sitting;Lifting;Standing;Walking;House hold activities    How long can you sit comfortably? 5 minutes    How long can you stand comfortably? 5  minutes    How long can you walk comfortably? 10 ft    Patient Stated Goals Walk without walker, walk further with walker, get some strength in back     Currently in Pain? No/denies    Pain Score 0-No pain          Treatment: Nu-step x 5 mins L3 UE and LE Seated marching x 15 x 2 Transfer training sit to stand with Rw and min assist for initial static standing  Seated LAQ x 15 x 2 sets Gait training with BRW 75 feet x 2  Patient reports fatigue today with gait and exercises.   Patient performed with instruction, verbal cues, tactile cues of therapist: goal: increase tissue extensibility, promote proper posture, improve mobility                         PT Education - 01/12/20 1530    Education provided Yes    Education Details HEP    Person(s) Educated Patient    Methods Explanation    Comprehension Verbal cues required;Need further instruction            PT Short Term Goals - 10/20/19 1722      PT SHORT TERM GOAL #1   Title Patient will  be independent in home exercise program to improve strength/mobility for better functional independence with ADLs.    Baseline hep compliant    Time 2    Period Weeks    Status Achieved      PT SHORT TERM GOAL #2   Title Patient will require min cueing for STS transfer with CGA for increased independence with mobility.     Baseline CGA    Time 2    Period Weeks    Status Achieved      PT SHORT TERM GOAL #3   Title Patient will maintain upright posture for > 15 seconds to demonstrate strengthened postural control muscualture     Baseline 18 seconds     Time 2    Period Weeks    Status Achieved             PT Long Term Goals - 12/20/19 0001      PT LONG TERM GOAL #1   Title Patient will perform bed mobility (rolling and supine <>sit) with Mod I  to increase mobility and  perform ADLs.     Baseline 9/13: able to perform with mod I occasional Min A when very fatigued 7/14: Min A 4/21: unable to perform  6/15: improving but still min-mod A     Time 12    Period Weeks    Status Partially Met    Target Date 01/12/20      PT LONG TERM GOAL #2   Title Patient (< 47 years old) will complete five times sit to stand test in < 10 seconds indicating an increased LE strength and improved balance.    Baseline  9/13: 14 seconds with one near LOB 7/14: 14.31 seconds 6/21: 16.86 seconds 4/21: 18.8 seconds 06/02/19: 19.93 seconds one hand on RW, one hand on w/c 12/30: 16 secpmds BUE support 02/11/19: 36 seconds 5/19: 20.6s from [facility], one hand on walker and one on WC, 80% upright posture 7/1: 22 seconds one hand on walker one hand on w/c  9/14: 48 seconds with full upright posture    Time 12    Period Weeks    Status Partially Met    Target Date 01/12/20      PT LONG TERM GOAL #3   Title Patient will negotiate a bathroom with a walker and Min A for increased indep in home and external environment    Baseline  9/13: able to some in standing, requires sitting down to wash hands 7/14: unable to do     Time 12    Period Weeks    Status Partially Met    Target Date 01/12/20      PT LONG TERM GOAL #4   Title Patient will increase BLE gross strength to 4+/5 as to improve functional strength for independent gait, increased standing tolerance and increased ADL ability.    Baseline  4/21: see note 06/02/19: hip flexion 4-/5, extension/abd 3/5 12/30: hip flexion 4/5 hip extension and abduction 3/5 02/11/19: hip flexion 4/5 hip extension and abduction 2+/54-/5 RLE, 10/15: 4/5 RLE 11/7: 4/5 RLE 3/3: 4/5 7/1:  hip flexion 4/5 hip extension 2/5 hip abduction/adduciton 2/5     Time 12    Period Weeks    Status Partially Met    Target Date 01/12/20      PT LONG TERM GOAL #6   Title Patient will ambulate 200 ft with least assistive device and Supervision with no breaks to allow for increased mobility within home.  Baseline 9/13: performed in previous session 7/14: 190 ft with RW 4/21: ambulate 150 ft 6/21: had to  terminate after 90 ft due to cardiac     Time 12    Period Weeks    Status Achieved    Target Date 01/12/20      PT LONG TERM GOAL #7   Title Patient will perform 10 MWT in <30 seconds for improved gait speed, gait mechanics, and functional capacity for mobility.     Baseline  9/13: terminated due to wardrobe malfunction 9/8: 42 seconds with R foot collapse 7/14: 37.98 seconds 6/21: 46.9 seconds with RW 07/28/19: 51 seconds with RW 06/02/19: 62.5 seconds with RW 11/4: 56 seconds with AFO and RW 12/30: 53 seconds with AFO and RW     Time 12    Period Weeks    Status Partially Met    Target Date 01/12/20      PT LONG TERM GOAL #8   Title Patient will increase Berg Balance score by > 6 points (38/56) to demonstrate decreased fall risk during functional activities.    Baseline 9/13: 31/56 7/14: 33/56 07/28/19: 32/56 6/21: 32/56     Time 12    Period Weeks    Status Partially Met    Target Date 01/12/20                 Plan - 01/12/20 1532    Clinical Impression Statement  Pt was able to perform all exercises today with CGA.Marland Kitchen Pt was able to perform all balance and strength exercises, demonstrating improvements in LE strength and stability.    Pt requires verbal, visual and tactile cues during exercise in order to complete tasks with proper form and technique..  Pt would continue to benefit from skilled PT services in order to further strengthen LE's, improve static and dynamic balance, and improve coordination in order to increase functional mobility and decrease risk of falls   Rehab Potential Fair    Clinical Impairments Affecting Rehab Potential hx of HTN, HLD, CVA, learning disability, Diabetes, brain tumor,     PT Frequency 2x / week    PT Duration 12 weeks    PT Treatment/Interventions ADLs/Self Care Home Management;Aquatic Therapy;Ultrasound;Moist Heat;Traction;DME Instruction;Gait training;Stair training;Functional mobility training;Therapeutic activities;Therapeutic  exercise;Orthotic Fit/Training;Neuromuscular re-education;Balance training;Patient/family education;Manual techniques;Wheelchair mobility training;Passive range of motion;Energy conservation;Taping;Visual/perceptual remediation/compensation    PT Next Visit Plan progress standing balance    PT Home Exercise Plan TrA contraction, glute sets: needs updates. Potentially some time in supine flat in hospital bed, to improve hip extension deficits.     Consulted and Agree with Plan of Care Patient    Family Member Consulted mother           Patient will benefit from skilled therapeutic intervention in order to improve the following deficits and impairments:  Abnormal gait, Decreased activity tolerance, Decreased balance, Decreased knowledge of precautions, Decreased endurance, Decreased coordination, Decreased knowledge of use of DME, Decreased mobility, Decreased range of motion, Difficulty walking, Decreased safety awareness, Decreased strength, Impaired flexibility, Impaired perceived functional ability, Impaired tone, Postural dysfunction, Improper body mechanics, Pain  Visit Diagnosis: Muscle weakness (generalized)  Other lack of coordination  Other abnormalities of gait and mobility  Hemiplegia and hemiparesis following cerebral infarction affecting right dominant side (HCC)  Acute CVA (cerebrovascular accident) ([place])  Left-sided weakness     Problem List Patient Active Problem List   Diagnosis Date Noted  . Acute CVA (cerebrovascular accident) ([place]) 05/02/2016  . Left-sided weakness 05/01/2016  [address] [place] DPT 01/12/2020, 3:33 PM  [place] MAIN [facility] SERVICES [address] [place], [place], [address] Phone: [phone]   Fax:  [phone]  Name: KENNIETH PLOTTS MRN: [id number] Date of Birth: 07-12-1965

## 2020-01-16 NOTE — Patient Instructions (Signed)
TREATMENT  Ther-ex  Nu-step x 5 mins L3 UE and LE Seated marching x 15 x 2 Transfer training sit to stand with Rw and min assist for initial static standing  Seated LAQ x 15 x 2 sets Gait training with BRW 75 feet x 2  Patient reports fatigue today with gait and exercises.   Patient performed with instruction, verbal cues, tactile cues of therapist: goal:increase tissue extensibility, promote proper posture, improve mobility

## 2020-01-17 ENCOUNTER — Ambulatory Visit: Payer: Medicare HMO | Admitting: Occupational Therapy

## 2020-01-17 ENCOUNTER — Ambulatory Visit: Payer: Medicare HMO

## 2020-01-17 ENCOUNTER — Encounter: Payer: Self-pay | Admitting: Occupational Therapy

## 2020-01-17 ENCOUNTER — Other Ambulatory Visit: Payer: Self-pay

## 2020-01-17 DIAGNOSIS — M6281 Muscle weakness (generalized): Secondary | ICD-10-CM

## 2020-01-17 DIAGNOSIS — R278 Other lack of coordination: Secondary | ICD-10-CM

## 2020-01-17 DIAGNOSIS — R2689 Other abnormalities of gait and mobility: Secondary | ICD-10-CM

## 2020-01-17 NOTE — Therapy (Signed)
[place] MAIN [facility] SERVICES [address] [place], [place], [address] Phone: [phone]   Fax:  [phone]  Physical Therapy Treatment/Recertification  Patient Details  Name: Curtis Powell MRN: [id number] Date of Birth: 03/10/1966 No data recorded  Encounter Date: 01/17/2020   PT End of Session - 01/17/20 1422    Visit Number [id number]    Number of Visits 253    Date for PT Re-Evaluation 04/10/20    Authorization Type 8/10 PN 9/8    Authorization Time Period authorized 11/16/18-05/19/19    Authorization - Number of Visits --    PT Start Time 1345    PT Stop Time 1430    PT Time Calculation (min) 45 min    Equipment Utilized During Treatment Gait belt    Activity Tolerance Patient tolerated treatment well;No increased pain    Behavior During Therapy WFL for tasks assessed/performed           Past Medical History:  Diagnosis Date  . GERD (gastroesophageal reflux disease)   . Hyperlipidemia   . Hypertension   . Obesity   . Stroke [facility])     Past Surgical History:  Procedure Laterality Date  . BRAIN SURGERY      There were no vitals filed for this visit.   Subjective Assessment - 01/17/20 1422    Subjective Patient reports that he is doing well today. No falls since last therapy session. No changes in health/medication. No specific questions/concerns.    Pertinent History  Patient is a pleasant 54 year old male who presents to physical therapy for weakness and immobility secondary to CVA.  Had a stroke in Feb 2018. He was previously fully independent, but this stroke caused severe residual deficits, mainly on the right side as well as speech, and now he is unable to walk or perform most of his ADLs on his own. Entire right side is very weak. He still has a little difficulty with speech but his swallowing is improved to baseline. His mom had to move in with him and now is his main caregiver.     Limitations  Sitting;Lifting;Standing;Walking;House hold activities    How long can you sit comfortably? 5 minutes    How long can you stand comfortably? 5 minutes    How long can you walk comfortably? 10 ft    Patient Stated Goals Walk without walker, walk further with walker, get some strength in back     Currently in Pain? No/denies            TREATMENT   Neuromuscular Re-education  All balance exercises perform in // bars without UE support: Updated 48mgait speed with patient, pt ambulated 164mn 48s; Static standing balance WBOS x 30s, interrupted by multiple LOB posterior requiring verbal cues from therapist for correction; Standing balance WBOS with horizontal and vertical head turns x 30s each, multiple LOB with significant degradation of balance with head turns compared to static standing balance;   Ther-ex  Seated marching 2 x 15; Seated clams with manual resistance from therapist 2 x 15; Seated adductor squeezes with manual resistance from therapist 2 x 15; Standing marching with BUE support on // bars x 10; Seated LAQ soccer ball kicks to challenge strength, power, and coordination x multiple bouts with each LE;   Pt educated throughout session about proper posture and technique with exercises. Improved exercise technique, movement at target joints, use of target muscles after min to mod verbal, visual, tactile cues.  Updated necessary goals with patient today. AT this time he has now met his goal for ambulation of 200 ft. His 10 MWT was 48s today which is a slight decline from the last couple attempts however signiifcantly improved from his times prior to April 2021.  He is demonstrating short term carryover in standing tasks while working toward goal of improved independence in the bathroom with toileting and washing. Patient will continue to benefit from skilled therapy in order to increase strength and endurance. His condition has the potential to improve in response to  therapy. Maximum improvement is yet to be obtained. The anticipated improvement is attainable and reasonable in a generally predictable time.  Patient reports he is getting more steady and able to stand without help better, still wants to work on balance due to his fall and walking more.                       PT Short Term Goals - 10/20/19 1722      PT SHORT TERM GOAL #1   Title Patient will be independent in home exercise program to improve strength/mobility for better functional independence with ADLs.    Baseline hep compliant    Time 2    Period Weeks    Status Achieved      PT SHORT TERM GOAL #2   Title Patient will require min cueing for STS transfer with CGA for increased independence with mobility.     Baseline CGA    Time 2    Period Weeks    Status Achieved      PT SHORT TERM GOAL #3   Title Patient will maintain upright posture for > 15 seconds to demonstrate strengthened postural control muscualture     Baseline 18 seconds     Time 2    Period Weeks    Status Achieved             PT Long Term Goals - 01/18/20 2620      PT LONG TERM GOAL #1   Title Patient will perform bed mobility (rolling and supine <>sit) with Mod I  to increase mobility and  perform ADLs.     Baseline 9/13: able to perform with mod I occasional Min A when very fatigued 7/14: Min A 4/21: unable to perform 6/15: improving but still min-mod A     Time 12    Period Weeks    Status Partially Met    Target Date 04/10/20      PT LONG TERM GOAL #2   Title Patient (< 62 years old) will complete five times sit to stand test in < 10 seconds indicating an increased LE strength and improved balance.    Baseline  9/13: 14 seconds with one near LOB 7/14: 14.31 seconds 6/21: 16.86 seconds 4/21: 18.8 seconds 06/02/19: 19.93 seconds one hand on RW, one hand on w/c 12/30: 16 secpmds BUE support 02/11/19: 36 seconds 5/19: 20.6s from [facility], one hand on walker and one on WC, 80% upright posture 7/1:  22 seconds one hand on walker one hand on w/c  9/14: 48 seconds with full upright posture    Time 12    Period Weeks    Status Partially Met    Target Date 04/10/20      PT LONG TERM GOAL #3   Title Patient will negotiate a bathroom with a walker and Min A for increased indep in home and external environment  Baseline  9/13: able to some in standing, requires sitting down to wash hands 7/14: unable to do     Time 12    Period Weeks    Status Partially Met    Target Date 04/10/20      PT LONG TERM GOAL #4   Title Patient will increase BLE gross strength to 4+/5 as to improve functional strength for independent gait, increased standing tolerance and increased ADL ability.    Baseline  4/21: see note 06/02/19: hip flexion 4-/5, extension/abd 3/5 12/30: hip flexion 4/5 hip extension and abduction 3/5 02/11/19: hip flexion 4/5 hip extension and abduction 2+/54-/5 RLE, 10/15: 4/5 RLE 11/7: 4/5 RLE 3/3: 4/5 7/1:  hip flexion 4/5 hip extension 2/5 hip abduction/adduciton 2/5     Time 12    Period Weeks    Status Partially Met    Target Date 04/10/20      PT LONG TERM GOAL #6   Title Patient will ambulate 200 ft with least assistive device and Supervision with no breaks to allow for increased mobility within home.    Baseline 9/13: performed in previous session 7/14: 190 ft with RW 4/21: ambulate 150 ft 6/21: had to terminate after 90 ft due to cardiac     Time 12    Period Weeks    Status Achieved      PT LONG TERM GOAL #7   Title Patient will perform 10 MWT in <30 seconds for improved gait speed, gait mechanics, and functional capacity for mobility.     Baseline 10/11: 48s with BRW and WC follow; 9/13: terminated due to wardrobe malfunction 9/8: 42 seconds with R foot collapse 7/14: 37.98 seconds 6/21: 46.9 seconds with RW 07/28/19: 51 seconds with RW 06/02/19: 62.5 seconds with RW 11/4: 56 seconds with AFO and RW 12/30: 53 seconds with AFO and RW     Time 12    Period Weeks    Status  Partially Met    Target Date 04/11/19      PT LONG TERM GOAL #8   Title Patient will increase Berg Balance score by > 6 points (38/56) to demonstrate decreased fall risk during functional activities.    Baseline 9/13: 31/56 7/14: 33/56 07/28/19: 32/56 6/21: 32/56     Time 12    Period Weeks    Status Partially Met    Target Date 04/11/19                 Plan - 01/17/20 1423    Clinical Impression Statement Updated necessary goals with patient today. AT this time he has now met his goal for ambulation of 200 ft. His 10 MWT was 48s today which is a slight decline from the last couple attempts however signiifcantly improved from his times prior to April 2021.  He is demonstrating short term carryover in standing tasks while working toward goal of improved independence in the bathroom with toileting and washing. Patient will continue to benefit from skilled therapy in order to increase strength and endurance. His condition has the potential to improve in response to therapy. Maximum improvement is yet to be obtained. The anticipated improvement is attainable and reasonable in a generally predictable time.  Patient reports he is getting more steady and able to stand without help better, still wants to work on balance due to his fall and walking more.    Rehab Potential Fair    Clinical Impairments Affecting Rehab Potential hx of HTN, HLD, CVA, learning disability,  Diabetes, brain tumor,     PT Frequency 2x / week    PT Duration 12 weeks    PT Treatment/Interventions ADLs/Self Care Home Management;Aquatic Therapy;Ultrasound;Moist Heat;Traction;DME Instruction;Gait training;Stair training;Functional mobility training;Therapeutic activities;Therapeutic exercise;Orthotic Fit/Training;Neuromuscular re-education;Balance training;Patient/family education;Manual techniques;Wheelchair mobility training;Passive range of motion;Energy conservation;Taping;Visual/perceptual remediation/compensation    PT  Next Visit Plan progress standing balance    PT Home Exercise Plan TrA contraction, glute sets: needs updates. Potentially some time in supine flat in hospital bed, to improve hip extension deficits.     Consulted and Agree with Plan of Care Patient    Family Member Consulted mother           Patient will benefit from skilled therapeutic intervention in order to improve the following deficits and impairments:  Abnormal gait, Decreased activity tolerance, Decreased balance, Decreased knowledge of precautions, Decreased endurance, Decreased coordination, Decreased knowledge of use of DME, Decreased mobility, Decreased range of motion, Difficulty walking, Decreased safety awareness, Decreased strength, Impaired flexibility, Impaired perceived functional ability, Impaired tone, Postural dysfunction, Improper body mechanics, Pain  Visit Diagnosis: Muscle weakness (generalized)  Other lack of coordination  Other abnormalities of gait and mobility     Problem List Patient Active Problem List   Diagnosis Date Noted  . Acute CVA (cerebrovascular accident) ([place]) 05/02/2016  . Left-sided weakness 05/01/2016   Phillips Grout PT, DPT, GCS  Jadene Stemmer 01/18/2020, 9:15 AM  [place] MAIN [facility] SERVICES [address] [place], [place], [address] Phone: [phone]   Fax:  [phone]  Name: ELDEAN NANNA MRN: [id number] Date of Birth: 02-Jan-1966

## 2020-01-17 NOTE — Therapy (Signed)
[place] MAIN [facility] SERVICES [address] [place], [place], [address] Phone: [phone]   Fax:  [phone]  Occupational Therapy Treatment  Patient Details  Name: Curtis Powell MRN: [id number] Date of Birth: 04-15-1965 No data recorded  Encounter Date: 01/17/2020   OT End of Session - 01/17/20 1323    Visit Number [id number]    Number of Visits 258    Date for OT Re-Evaluation 04/03/20    Authorization Type Progress reporting period starting 09/15/2019    OT Start Time 1256    OT Stop Time 1344    OT Time Calculation (min) 48 min    Equipment Utilized During Treatment 4# weighted dowel    Activity Tolerance Patient tolerated treatment well    Behavior During Therapy [facility] for tasks assessed/performed           Past Medical History:  Diagnosis Date  . GERD (gastroesophageal reflux disease)   . Hyperlipidemia   . Hypertension   . Obesity   . Stroke [facility])     Past Surgical History:  Procedure Laterality Date  . BRAIN SURGERY      There were no vitals filed for this visit.   Subjective Assessment - 01/17/20 1313    Subjective  Pt reports some tightness in the R side groin area at night before bed or in the morning. Otherwise feeling good no complaints. States when he is good about staying hydrated, the soreness goes away.    Pertinent History Pt. is a 54 y.o. male who suffered a CVA on 05/01/2016. Pt. was admitted to the hospital. Once discharged, he received Home Health PT and OT services for about a month. Pt. has had multiple CVAs over the past 8 years, and has had multiple falls in the past 6 months. Pt. resides in an apartment. Pt. has caregivers for 80 hours. Pt.'s mother stays with pt. at night, and assists with IADL tasks.      Patient Stated Goals To be able to throw a ball and dribble a ball, do as much as I can for myself.     Currently in Pain? No/denies           OT Treatment  Therapeutic Exercise: OT engages pt in 5  mins on arm bike for aerobic exercise as well as warming up connective tissue in anticipation of weighted therapeutic exercise. Pt requires MIN cues to keep RPMs consistent and pt tolerates well. Pt requires ~2 minute rest break. In addition, OT engages pt in 1 set x15 reps FWD rows with 4# dowel to improve UE and core strength as it pertains to safely reaching to gather necessary ADL items such as meal prep supplies or grooming supplies. Next, OT engages pt in 1 set x15 reps bicep curls, 1 set x15 reps straigt arm raises and 1 set x15 reps upward punches. Pt tolerates well overall and continues to progress with strengthening and tolerance.    Therapeutic Activities: OT engages pt in fine motor coordination tasks including sorting, rotating, and turning cards to progress pt's in-hand manipulation skills. Pt does require increased time and MIN verbal cues to attend to correct sorting pattern, but overall he is efficient and 90% successful with tasks. Increased difficulty noted with radial/ulnar deviation bilaterally when lining stacks up/organizing and moving piles. Overall pt tolerates well. Will continue to progress fine motor coordination for increased efficiency with I/ADL tasks.  OT Education - 01/17/20 1320    Education provided Yes    Education Details exercises, strengthening    Person(s) Educated Patient    Methods Explanation;Demonstration    Comprehension Verbalized understanding;Returned demonstration;Verbal cues required            OT Short Term Goals - 03/03/17 1459      OT SHORT TERM GOAL #1   Title `             OT Long Term Goals - 01/10/20 1316      OT LONG TERM GOAL #1   Title Pt. will UE functioning to be able to perfrom toilet hygiene with minimal assistance.      OT LONG TERM GOAL #4   Title Pt. will perform self dressing with minA and A/E as needed.    Baseline Pt. continues to be able to donn a T shirt independently  with increased time. MinA donning a jacket, Mod A donning pants over his feet, and socks, minA with shoes.    Time 12    Period Weeks    Status On-going    Target Date 04/03/20      OT LONG TERM GOAL #5   Title Pt. will perform light home making tasks with minA    Baseline Pt is able to wash dishes while seated in w/c with increased accessibility of new apartment. Pt. is able to fold his clothes, his moms puts them in the and out of the washer, and dryer, and puts them onto hangers. Caregivers perform vacuuming, and dusting.    Time 12    Period Weeks      Long Term Additional Goals   Additional Long Term Goals Yes      OT LONG TERM GOAL #6   Title Pt. will write his name efficiently with 100% legibility    Baseline Pt. continues to present with 75% legibility with increased time required when printing his name, and 50% legibility when cursive writing his name to sign in when riding the [place]    Time 12    Period Weeks    Target Date 04/03/20      OT LONG TERM GOAL #7   Title Pt will independently and consistently follow HEP to increase UE strength to increase functional independence    Baseline Pt. continues to work towards independence with exercises    Time 12    Period Weeks    Target Date 04/03/20      OT LONG TERM GOAL #8   Title Pt. will require supervision ironing a shirt.    Baseline Pt. has progressed CGA, and complete set-up seated using a tabletop iron    Time 12    Period Weeks    Status On-going    Target Date 04/03/20      OT LONG TERM GOAL  #9   Baseline Pt. will independently be able to sew a button onto a shirt.    Time 12    Period Weeks    Target Date 04/03/20      OT LONG TERM GOAL  #10   TITLE Pt. will independently be able to throw a ball    Baseline Pt. continues to be limited with throwing overhand, however pt. is able to perform underhand throwing.    Time 12    Period Weeks    Status On-going    Target Date 04/03/20      OT LONG TERM GOAL   #11   TITLE  Pt. will improve UE functional reaching to be able to independently use his RUE to hand his clothes in the closest.    Baseline Pt. continues to improve with reaching up with the RUE, however has difficulty reaching the hanger rack in the closet. Pt. now uses a reacher.    Time 12    Period Weeks    Status On-going      OT LONG TERM GOAL  #13   TITLE Pt. will increase BUE strength to be able to prepare for assisting with yardwork.    Baseline Pt. BUE strength 4+/5 Pt. reports that he has been able to get out to the front.    Time 12    Period Weeks    Status On-going      OT LONG TERM GOAL  #14   TITLE Pt. will improve activity toelrance in standing to be able to complete standing ADLs, and IADLs with SBA.    Baseline Pt. is limited with ADL performance in standing.    Time 12    Period Weeks    Status New    Target Date 04/03/20                 Plan - 01/17/20 1420    Clinical Impression Statement Pt continues to make progress in strength and tolerance for ADL tasks including reaching to attain ADL items. Pt reports good follow-through/carry-over with suggested OT home exercises and general home tasks. OT engages pt in fine motor coordination tasks (such as flipping/rotating/sorting cards) for which he requires increased time and minimal cues. In addition, OT engages pt in strengthening with 4# dowel and 5 minute arm bike warm up exercise/ROM. Pt tolerates well overall and continues to progress. Will continue to follow per OT POC.    OT Occupational Profile and History Problem Focused Assessment - Including review of records relating to presenting problem    Occupational performance deficits (Please refer to evaluation for details): ADL's;IADL's    Body Structure / Function / Physical Skills ADL;FMC;ROM;IADL;Pain;Coordination    Rehab Potential Fair    Clinical Decision Making Several treatment options, min-mod task modification necessary    Comorbidities Affecting  Occupational Performance: May have comorbidities impacting occupational performance    Modification or Assistance to Complete Evaluation  Min-Moderate modification of tasks or assist with assess necessary to complete eval    OT Frequency 2x / week    OT Duration 12 weeks    OT Treatment/Interventions Self-care/ADL training;Patient/family education;DME and/or AE instruction;Therapeutic exercise;Neuromuscular education    Plan continue with current OT plan of care    Consulted and Agree with Plan of Care Patient           Patient will benefit from skilled therapeutic intervention in order to improve the following deficits and impairments:   Body Structure / Function / Physical Skills: ADL, FMC, ROM, IADL, Pain, Coordination       Visit Diagnosis: Muscle weakness (generalized)  Other lack of coordination    Problem List Patient Active Problem List   Diagnosis Date Noted  . Acute CVA (cerebrovascular accident) ([place]) 05/02/2016  . Left-sided weakness 05/01/2016    Gerrianne Scale, MS, OTR/L ascom [phone] 01/17/20, 2:46 PM  [place] MAIN [facility] SERVICES [address] [place], [place], [address] Phone: [phone]   Fax:  [phone]  Name: FAWAZ BORQUEZ MRN: [id number] Date of Birth: May 17, 1965

## 2020-01-19 ENCOUNTER — Encounter: Payer: Self-pay | Admitting: Occupational Therapy

## 2020-01-19 ENCOUNTER — Ambulatory Visit: Payer: Medicare HMO | Admitting: Occupational Therapy

## 2020-01-19 ENCOUNTER — Ambulatory Visit: Payer: Medicare HMO

## 2020-01-19 ENCOUNTER — Other Ambulatory Visit: Payer: Self-pay

## 2020-01-19 DIAGNOSIS — M6281 Muscle weakness (generalized): Secondary | ICD-10-CM

## 2020-01-19 DIAGNOSIS — R278 Other lack of coordination: Secondary | ICD-10-CM

## 2020-01-19 DIAGNOSIS — R2689 Other abnormalities of gait and mobility: Secondary | ICD-10-CM

## 2020-01-19 NOTE — Therapy (Signed)
[place] MAIN [facility] SERVICES [address] [place], [place], [address] Phone: [phone]   Fax:  [phone]  Physical Therapy Treatment  Patient Details  Name: Curtis Powell MRN: [id number] Date of Birth: 1965/04/10 No data recorded  Encounter Date: 01/19/2020   PT End of Session - 01/19/20 1449    Visit Number [id number]    Number of Visits 253    Date for PT Re-Evaluation 04/10/20    Authorization Type 9/10 PN 9/8    Authorization Time Period authorized 11/16/18-05/19/19    PT Start Time 1429    PT Stop Time 1513    PT Time Calculation (min) 44 min    Equipment Utilized During Treatment Gait belt    Activity Tolerance Patient tolerated treatment well;No increased pain    Behavior During Therapy WFL for tasks assessed/performed           Past Medical History:  Diagnosis Date  . GERD (gastroesophageal reflux disease)   . Hyperlipidemia   . Hypertension   . Obesity   . Stroke [facility])     Past Surgical History:  Procedure Laterality Date  . BRAIN SURGERY      There were no vitals filed for this visit.   Subjective Assessment - 01/19/20 1431    Subjective Patient denies falls or LOB since last visit. Patient notes that exercises have been going well with no provocation of pain.    Pertinent History  Patient is a pleasant 54 year old male who presents to physical therapy for weakness and immobility secondary to CVA.  Had a stroke in Feb 2018. He was previously fully independent, but this stroke caused severe residual deficits, mainly on the right side as well as speech, and now he is unable to walk or perform most of his ADLs on his own. Entire right side is very weak. He still has a little difficulty with speech but his swallowing is improved to baseline. His mom had to move in with him and now is his main caregiver.     Limitations Sitting;Lifting;Standing;Walking;House hold activities    How long can you sit comfortably? 5 minutes     How long can you stand comfortably? 5 minutes    How long can you walk comfortably? 10 ft    Patient Stated Goals Walk without walker, walk further with walker, get some strength in back     Currently in Pain? No/denies              Goals: 5xSTS:42.59 seconds with heavy UE reliance on RW, full stand LE MMT:  Muscle Group Left Right  Hip Flex 4-/5 4-/5  Knee Flex 4/5 3+/5  Knee Ext 4/5 4-/5  Ankle DF 4/5 /5*  Ankle PF 4/5 /5*  Hip Abd 4/5 4/5  Hip Add 4/5 3+/5  *AFO donned this session  Treatment: Seated bolster kicks for BLE coordination and sequencing of movement. 15x  Forward leans in chair with yellow medicine ball in order to promote forward trunk lean. 15x  Seated overhead press with yellow medicine ball to promote upright posture and core activation. 10x  Seated 6" step taps with 2# ankle weights. 12x each leg (24x total). PT cues for foot clearance when coming off step, as pt would slide foot to return to resting position.  Static stand tossing ball into hoop for perturbation. CGA and chair behind for safety. Patient requires SUE support on RW during task. 19 x 2 sets.  Balloon taps with  CGA and chair behind for safety. Patient requires SUE support on RW for stability. Min- mod A required to stabilize when patient displaces posteriorly. Patient requires cues for upright posture. 25 taps total    Pt educated throughout session about proper posture and technique with exercises. Improved exercise technique, movement at target joints, use of target muscles after min to mod verbal, visual, tactile cues.                    PT Education - 01/19/20 1448    Education provided Yes    Education Details body mechanics, breathing, pacing, exercise technique    Person(s) Educated Patient    Methods Explanation;Demonstration;Tactile cues;Verbal cues    Comprehension Verbalized understanding;Tactile cues required;Returned demonstration;Verbal cues required             PT Short Term Goals - 10/20/19 1722      PT SHORT TERM GOAL #1   Title Patient will be independent in home exercise program to improve strength/mobility for better functional independence with ADLs.    Baseline hep compliant    Time 2    Period Weeks    Status Achieved      PT SHORT TERM GOAL #2   Title Patient will require min cueing for STS transfer with CGA for increased independence with mobility.     Baseline CGA    Time 2    Period Weeks    Status Achieved      PT SHORT TERM GOAL #3   Title Patient will maintain upright posture for > 15 seconds to demonstrate strengthened postural control muscualture     Baseline 18 seconds     Time 2    Period Weeks    Status Achieved             PT Long Term Goals - 01/18/20 5929      PT LONG TERM GOAL #1   Title Patient will perform bed mobility (rolling and supine <>sit) with Mod I  to increase mobility and  perform ADLs.     Baseline 9/13: able to perform with mod I occasional Min A when very fatigued 7/14: Min A 4/21: unable to perform 6/15: improving but still min-mod A     Time 12    Period Weeks    Status Partially Met    Target Date 04/10/20      PT LONG TERM GOAL #2   Title Patient (< 79 years old) will complete five times sit to stand test in < 10 seconds indicating an increased LE strength and improved balance.    Baseline  9/13: 14 seconds with one near LOB 7/14: 14.31 seconds 6/21: 16.86 seconds 4/21: 18.8 seconds 06/02/19: 19.93 seconds one hand on RW, one hand on w/c 12/30: 16 secpmds BUE support 02/11/19: 36 seconds 5/19: 20.6s from [facility], one hand on walker and one on WC, 80% upright posture 7/1: 22 seconds one hand on walker one hand on w/c  9/14: 48 seconds with full upright posture    Time 12    Period Weeks    Status Partially Met    Target Date 04/10/20      PT LONG TERM GOAL #3   Title Patient will negotiate a bathroom with a walker and Min A for increased indep in home and external environment     Baseline  9/13: able to some in standing, requires sitting down to wash hands 7/14: unable to do     Time  12    Period Weeks    Status Partially Met    Target Date 04/10/20      PT LONG TERM GOAL #4   Title Patient will increase BLE gross strength to 4+/5 as to improve functional strength for independent gait, increased standing tolerance and increased ADL ability.    Baseline  4/21: see note 06/02/19: hip flexion 4-/5, extension/abd 3/5 12/30: hip flexion 4/5 hip extension and abduction 3/5 02/11/19: hip flexion 4/5 hip extension and abduction 2+/54-/5 RLE, 10/15: 4/5 RLE 11/7: 4/5 RLE 3/3: 4/5 7/1:  hip flexion 4/5 hip extension 2/5 hip abduction/adduciton 2/5     Time 12    Period Weeks    Status Partially Met    Target Date 04/10/20      PT LONG TERM GOAL #6   Title Patient will ambulate 200 ft with least assistive device and Supervision with no breaks to allow for increased mobility within home.    Baseline 9/13: performed in previous session 7/14: 190 ft with RW 4/21: ambulate 150 ft 6/21: had to terminate after 90 ft due to cardiac     Time 12    Period Weeks    Status Achieved      PT LONG TERM GOAL #7   Title Patient will perform 10 MWT in <30 seconds for improved gait speed, gait mechanics, and functional capacity for mobility.     Baseline 10/11: 48s with BRW and WC follow; 9/13: terminated due to wardrobe malfunction 9/8: 42 seconds with R foot collapse 7/14: 37.98 seconds 6/21: 46.9 seconds with RW 07/28/19: 51 seconds with RW 06/02/19: 62.5 seconds with RW 11/4: 56 seconds with AFO and RW 12/30: 53 seconds with AFO and RW     Time 12    Period Weeks    Status Partially Met    Target Date 04/11/19      PT LONG TERM GOAL #8   Title Patient will increase Berg Balance score by > 6 points (38/56) to demonstrate decreased fall risk during functional activities.    Baseline 9/13: 31/56 7/14: 33/56 07/28/19: 32/56 6/21: 32/56     Time 12    Period Weeks    Status Partially Met     Target Date 04/11/19                 Plan - 01/19/20 1518    Clinical Impression Statement Patient able to perform exercises this session with no provocation of pain. Patient demonstrates inc BLE strength seen via MMT grades. Patient requires seated therapeutic rest breaks in order to maintain safe heart rate ranges. Patient continues to require cues for upright posture,as he demonstrates forward flexion and head down when fatigued. Pt would continue to benefit from skilled PT services in order to further strengthen LE's, improve static and dynamic balance, and improve coordination in order to increase functional mobility and decrease risk of falls    Rehab Potential Fair    Clinical Impairments Affecting Rehab Potential hx of HTN, HLD, CVA, learning disability, Diabetes, brain tumor,     PT Frequency 2x / week    PT Duration 12 weeks    PT Treatment/Interventions ADLs/Self Care Home Management;Aquatic Therapy;Ultrasound;Moist Heat;Traction;DME Instruction;Gait training;Stair training;Functional mobility training;Therapeutic activities;Therapeutic exercise;Orthotic Fit/Training;Neuromuscular re-education;Balance training;Patient/family education;Manual techniques;Wheelchair mobility training;Passive range of motion;Energy conservation;Taping;Visual/perceptual remediation/compensation    PT Next Visit Plan progress standing balance    PT Home Exercise Plan TrA contraction, glute sets: needs updates. Potentially some time in supine flat in hospital  bed, to improve hip extension deficits.     Consulted and Agree with Plan of Care Patient    Family Member Consulted mother           Patient will benefit from skilled therapeutic intervention in order to improve the following deficits and impairments:  Abnormal gait, Decreased activity tolerance, Decreased balance, Decreased knowledge of precautions, Decreased endurance, Decreased coordination, Decreased knowledge of use of DME, Decreased  mobility, Decreased range of motion, Difficulty walking, Decreased safety awareness, Decreased strength, Impaired flexibility, Impaired perceived functional ability, Impaired tone, Postural dysfunction, Improper body mechanics, Pain  Visit Diagnosis: Muscle weakness (generalized)  Other abnormalities of gait and mobility     Problem List Patient Active Problem List   Diagnosis Date Noted  . Acute CVA (cerebrovascular accident) ([place]) 05/02/2016  . Left-sided weakness 05/01/2016   Tonny Bollman, SPT   This entire session was performed under direct supervision and direction of a licensed therapist/therapist assistant . I have personally read, edited and approve of the note as written.  Janna Arch, PT, DPT   01/19/2020, 3:47 PM  [place] MAIN [facility] SERVICES [address] [place], [place], [address] Phone: [phone]   Fax:  [phone]  Name: Curtis Powell MRN: [id number] Date of Birth: 03-13-1966

## 2020-01-19 NOTE — Therapy (Signed)
[place] MAIN [facility] SERVICES [address] [place], [place], [address] Phone: [phone]   Fax:  [phone]  Occupational Therapy Treatment  Patient Details  Name: Curtis Powell MRN: [id number] Date of Birth: [birth year] No data recorded  Encounter Date: 01/19/2020   OT End of Session - 01/19/20 1354    Visit Number [id number]    Number of Visits 258    Date for OT Re-Evaluation 04/03/20    Authorization Type Progress reporting period starting 09/15/2019    OT Start Time 1347    OT Stop Time 1430    OT Time Calculation (min) 43 min    Activity Tolerance Patient tolerated treatment well    Behavior During Therapy [facility] for tasks assessed/performed           Past Medical History:  Diagnosis Date  . GERD (gastroesophageal reflux disease)   . Hyperlipidemia   . Hypertension   . Obesity   . Stroke [facility])     Past Surgical History:  Procedure Laterality Date  . BRAIN SURGERY      There were no vitals filed for this visit.   Subjective Assessment - 01/19/20 1535    Subjective  Pt. reports that he has an MD follow-up appointment about his pelvic pain.    Patient is accompanied by: Family member    Pain Score 1     Pain Location Pelvis    Pain Orientation Right    Pain Descriptors / Indicators Aching    Pain Type Acute pain    Pain Onset 1 to 4 weeks ago          OT TREATMENT    Therapeutic Exercise:  Pt. performed 3.5# dowel ex. For UE strengthening secondary to weakness. Bilateral shoulder flexion, chest press, and circular patterns were performed for 1 set 30 reps each. Pt. worked on [place] seated for 8 min with moderate resistance.  Pt. contnues to improve, and is now able to reach into the refrigerator independently for items when he needs  snacks. Pt. continues to improve BUE strength. Pt. was able to tolerate increased resistive exercises with increased weight. Pt. continues to work onBUEstrength, and functional  reaching.Pt. continues tomake progress overall.Pt.continues toimprovewith LUE strength, and overall [place] skills.Pt.requires verbal, and tactile cues for there. Ex. technique.Pt. continues to present with limited BUE strength, and [facility] skills. Pt.continues to work on improving UE strength, and [facility] skills in order to work towards maximizing independence with ADLs, and IADLtasks.                        OT Education - 01/19/20 1409    Education provided Yes    Education Details exercises, strengthening    Person(s) Educated Patient    Methods Explanation;Demonstration    Comprehension Verbalized understanding;Returned demonstration;Verbal cues required            OT Short Term Goals - 03/03/17 1459      OT SHORT TERM GOAL #1   Title `             OT Long Term Goals - 01/10/20 1316      OT LONG TERM GOAL #1   Title Pt. will UE functioning to be able to perfrom toilet hygiene with minimal assistance.      OT LONG TERM GOAL #4   Title Pt. will perform self dressing with minA and A/E as needed.    Baseline Pt. continues to be  able to donn a T shirt independently with increased time. MinA donning a jacket, Mod A donning pants over his feet, and socks, minA with shoes.    Time 12    Period Weeks    Status On-going    Target Date 04/03/20      OT LONG TERM GOAL #5   Title Pt. will perform light home making tasks with minA    Baseline Pt is able to wash dishes while seated in w/c with increased accessibility of new apartment. Pt. is able to fold his clothes, his moms puts them in the and out of the washer, and dryer, and puts them onto hangers. Caregivers perform vacuuming, and dusting.    Time 12    Period Weeks      Long Term Additional Goals   Additional Long Term Goals Yes      OT LONG TERM GOAL #6   Title Pt. will write his name efficiently with 100% legibility    Baseline Pt. continues to present with 75% legibility with increased time  required when printing his name, and 50% legibility when cursive writing his name to sign in when riding the [place]    Time 12    Period Weeks    Target Date 04/03/20      OT LONG TERM GOAL #7   Title Pt will independently and consistently follow HEP to increase UE strength to increase functional independence    Baseline Pt. continues to work towards independence with exercises    Time 12    Period Weeks    Target Date 04/03/20      OT LONG TERM GOAL #8   Title Pt. will require supervision ironing a shirt.    Baseline Pt. has progressed CGA, and complete set-up seated using a tabletop iron    Time 12    Period Weeks    Status On-going    Target Date 04/03/20      OT LONG TERM GOAL  #9   Baseline Pt. will independently be able to sew a button onto a shirt.    Time 12    Period Weeks    Target Date 04/03/20      OT LONG TERM GOAL  #10   TITLE Pt. will independently be able to throw a ball    Baseline Pt. continues to be limited with throwing overhand, however pt. is able to perform underhand throwing.    Time 12    Period Weeks    Status On-going    Target Date 04/03/20      OT LONG TERM GOAL  #11   TITLE Pt. will improve UE functional reaching to be able to independently use his RUE to hand his clothes in the closest.    Baseline Pt. continues to improve with reaching up with the RUE, however has difficulty reaching the hanger rack in the closet. Pt. now uses a reacher.    Time 12    Period Weeks    Status On-going      OT LONG TERM GOAL  #13   TITLE Pt. will increase BUE strength to be able to prepare for assisting with yardwork.    Baseline Pt. BUE strength 4+/5 Pt. reports that he has been able to get out to the front.    Time 12    Period Weeks    Status On-going      OT LONG TERM GOAL  #14   TITLE Pt. will improve activity toelrance in  standing to be able to complete standing ADLs, and IADLs with SBA.    Baseline Pt. is limited with ADL performance in standing.     Time 12    Period Weeks    Status New    Target Date 04/03/20                 Plan - 01/19/20 1355    Clinical Impression Statement Pt. contnues to improve, and is now able to reach into the refrigerator independently for items when he needs  snacks. Pt. continues to improve BUE strength. Pt. was able to tolerate increased resistive exercises with increased weight. Pt. continues to work onBUEstrength, and functional reaching.Pt. continues tomake progress overall.Pt.continues toimprovewith LUE strength, and overall [place] skills.Pt.requires verbal, and tactile cues for there. Ex. technique.Pt. continues to present with limited BUE strength, and [facility] skills. Pt.continues to work on improving UE strength, and [facility] skills in order to work towards maximizing independence with ADLs, and IADLtasks.   OT Occupational Profile and History Problem Focused Assessment - Including review of records relating to presenting problem    Occupational performance deficits (Please refer to evaluation for details): ADL's;IADL's    Body Structure / Function / Physical Skills ADL;FMC;ROM;IADL;Pain;Coordination    Rehab Potential Fair    Clinical Decision Making Several treatment options, min-mod task modification necessary    Comorbidities Affecting Occupational Performance: May have comorbidities impacting occupational performance    Modification or Assistance to Complete Evaluation  Min-Moderate modification of tasks or assist with assess necessary to complete eval    OT Frequency 2x / week    OT Duration 12 weeks    OT Treatment/Interventions Self-care/ADL training;Patient/family education;DME and/or AE instruction;Therapeutic exercise;Neuromuscular education    Consulted and Agree with Plan of Care Patient           Patient will benefit from skilled therapeutic intervention in order to improve the following deficits and impairments:   Body Structure / Function / Physical Skills: ADL, FMC, ROM,  IADL, Pain, Coordination       Visit Diagnosis: Muscle weakness (generalized)  Other lack of coordination    Problem List Patient Active Problem List   Diagnosis Date Noted  . Acute CVA (cerebrovascular accident) ([place]) 05/02/2016  . Left-sided weakness 05/01/2016    Harrel Carina, MS, OTR/L 01/19/2020, 3:37 PM  [place] MAIN [facility] SERVICES [address] [place], [place], [address] Phone: [phone]   Fax:  [phone]  Name: Curtis Powell MRN: [id number] Date of Birth: 1965/04/17

## 2020-01-24 ENCOUNTER — Encounter: Payer: Self-pay | Admitting: Occupational Therapy

## 2020-01-24 ENCOUNTER — Other Ambulatory Visit: Payer: Self-pay

## 2020-01-24 ENCOUNTER — Ambulatory Visit: Payer: Medicare HMO | Admitting: Occupational Therapy

## 2020-01-24 ENCOUNTER — Ambulatory Visit: Payer: Medicare HMO

## 2020-01-24 DIAGNOSIS — R278 Other lack of coordination: Secondary | ICD-10-CM

## 2020-01-24 DIAGNOSIS — M6281 Muscle weakness (generalized): Secondary | ICD-10-CM | POA: Diagnosis not present

## 2020-01-24 DIAGNOSIS — R2689 Other abnormalities of gait and mobility: Secondary | ICD-10-CM

## 2020-01-24 NOTE — Therapy (Signed)
[place] MAIN [facility] SERVICES [address] [place], [place], [address] Phone: [phone]   Fax:  [phone]  Physical Therapy Progress Note   Dates of reporting period  12/15/19   to   01/24/20  Patient Details  Name: Curtis [facility] MRN: [id number] Date of Birth: 1966/01/16 No data recorded  Encounter Date: 01/24/2020   PT End of Session - 01/24/20 1358    Visit Number [id number]    Number of Visits 253    Date for PT Re-Evaluation 04/10/20    Authorization Type 10/10; PN 01/24/20    Authorization Time Period authorized 11/16/18-05/19/19    PT Start Time 1350    PT Stop Time 1430    PT Time Calculation (min) 40 min    Equipment Utilized During Treatment Gait belt    Activity Tolerance Patient tolerated treatment well;No increased pain    Behavior During Therapy WFL for tasks assessed/performed           Past Medical History:  Diagnosis Date  . GERD (gastroesophageal reflux disease)   . Hyperlipidemia   . Hypertension   . Obesity   . Stroke [facility])     Past Surgical History:  Procedure Laterality Date  . BRAIN SURGERY      There were no vitals filed for this visit.   Subjective Assessment - 01/24/20 1357    Subjective Patient denies falls or LOB since last visit. Denies pain upon arrival today. No specific questions or concerns currently.    Pertinent History  Patient is a pleasant 54 year old male who presents to physical therapy for weakness and immobility secondary to CVA.  Had a stroke in Feb 2018. He was previously fully independent, but this stroke caused severe residual deficits, mainly on the right side as well as speech, and now he is unable to walk or perform most of his ADLs on his own. Entire right side is very weak. He still has a little difficulty with speech but his swallowing is improved to baseline. His mom had to move in with him and now is his main caregiver.     Limitations Sitting;Lifting;Standing;Walking;House hold  activities    How long can you sit comfortably? 5 minutes    How long can you stand comfortably? 5 minutes    How long can you walk comfortably? 10 ft    Patient Stated Goals Walk without walker, walk further with walker, get some strength in back     Currently in Pain? No/denies               TREATMENT   Ther-ex  NuStep L2 x 4 minutes for warm-up during history (2 minutes unbilled); Seated 2" step taps with 2.5# ankle weights 12x each leg (24x total). PT cues for foot clearance when coming off step, as pt would slide foot to return to resting position. Seated 5" step taps with 2.5# ankle weights 12x each leg (24x total).  Seated LAQ with 2.5# ankle weights x 15 BLE; Seated ball kicks to therapist with 2.5# ankle weights alternating sides x multiple bouts on each side Seated overhead press with yellow 2kg medicine ball to promote upright posture and core activation x 15; Seated chest passes to therapist with 2kg medicine ball to challenge balance and improve power x 15;   Neuromuscular Re-education  Static standing balance without UE support x 60s; Standing balance with dynamic reaching for ball and therapist adjusting the distance and location of the ball x  multiple bouts; Balloon taps with CGA and chair behind for safety. Patient requires SUE support on // bars for stability. Min- mod A required to stabilize when patient displaces posteriorly;    Pt educated throughout session about proper posture and technique with exercises. Improved exercise technique, movement at target joints, use of target muscles after min to mod verbal, visual, tactile cues.    Goals updated recently so no need to update today during session. At this time he has now met his goal for ambulation of 200 ft. His 10 MWT was last tested at 48s which is a slight decline from the previous couple attempts however signiifcantly improved from his times prior to April 2021.  He is demonstrating short term carryover  in standing tasks while working toward goal of improved independence in the bathroom with toileting and washing. Patient will continue to benefit from skilled therapy in order to increase strength and endurance. His condition has the potential to improve in response to therapy. Maximum improvement is yet to be obtained. The anticipated improvement is attainable and reasonable in a generally predictable time.  Patient reports he is getting more steady and able to stand without help better, still wants to work on balance due to his fall and walking more.                             PT Short Term Goals - 01/24/20 1711      PT SHORT TERM GOAL #1   Title Patient will be independent in home exercise program to improve strength/mobility for better functional independence with ADLs.    Baseline hep compliant    Time 2    Period Weeks    Status Achieved      PT SHORT TERM GOAL #2   Title Patient will require min cueing for STS transfer with CGA for increased independence with mobility.     Baseline CGA    Time 2    Period Weeks    Status Achieved      PT SHORT TERM GOAL #3   Title Patient will maintain upright posture for > 15 seconds to demonstrate strengthened postural control muscualture     Baseline 18 seconds     Time 2    Period Weeks    Status Achieved             PT Long Term Goals - 01/24/20 1711      PT LONG TERM GOAL #1   Title Patient will perform bed mobility (rolling and supine <>sit) with Mod I  to increase mobility and  perform ADLs.     Baseline 9/13: able to perform with mod I occasional Min A when very fatigued 7/14: Min A 4/21: unable to perform 6/15: improving but still min-mod A     Time 12    Period Weeks    Status Partially Met    Target Date 04/10/20      PT LONG TERM GOAL #2   Title Patient (< 70 years old) will complete five times sit to stand test in < 10 seconds indicating an increased LE strength and improved balance.    Baseline   9/13: 14 seconds with one near LOB 7/14: 14.31 seconds 6/21: 16.86 seconds 4/21: 18.8 seconds 06/02/19: 19.93 seconds one hand on RW, one hand on w/c 12/30: 16 secpmds BUE support 02/11/19: 36 seconds 5/19: 20.6s from [place], one hand on walker and one on WC,  80% upright posture 7/1: 22 seconds one hand on walker one hand on w/c  9/14: 48 seconds with full upright posture    Time 12    Period Weeks    Status Partially Met    Target Date 04/10/20      PT LONG TERM GOAL #3   Title Patient will negotiate a bathroom with a walker and Min A for increased indep in home and external environment    Baseline  9/13: able to some in standing, requires sitting down to wash hands 7/14: unable to do     Time 12    Period Weeks    Status Partially Met    Target Date 04/10/20      PT LONG TERM GOAL #4   Title Patient will increase BLE gross strength to 4+/5 as to improve functional strength for independent gait, increased standing tolerance and increased ADL ability.    Baseline  4/21: see note 06/02/19: hip flexion 4-/5, extension/abd 3/5 12/30: hip flexion 4/5 hip extension and abduction 3/5 02/11/19: hip flexion 4/5 hip extension and abduction 2+/54-/5 RLE, 10/15: 4/5 RLE 11/7: 4/5 RLE 3/3: 4/5 7/1:  hip flexion 4/5 hip extension 2/5 hip abduction/adduciton 2/5     Time 12    Period Weeks    Status Partially Met    Target Date 04/10/20      PT LONG TERM GOAL #6   Title Patient will ambulate 200 ft with least assistive device and Supervision with no breaks to allow for increased mobility within home.    Baseline 9/13: performed in previous session 7/14: 190 ft with RW 4/21: ambulate 150 ft 6/21: had to terminate after 90 ft due to cardiac     Time 12    Period Weeks    Status Achieved      PT LONG TERM GOAL #7   Title Patient will perform 10 MWT in <30 seconds for improved gait speed, gait mechanics, and functional capacity for mobility.     Baseline 10/11: 48s with BRW and WC follow; 9/13: terminated due  to wardrobe malfunction 9/8: 42 seconds with R foot collapse 7/14: 37.98 seconds 6/21: 46.9 seconds with RW 07/28/19: 51 seconds with RW 06/02/19: 62.5 seconds with RW 11/4: 56 seconds with AFO and RW 12/30: 53 seconds with AFO and RW     Time 12    Period Weeks    Status Partially Met    Target Date 04/10/20      PT LONG TERM GOAL #8   Title Patient will increase Berg Balance score by > 6 points (38/56) to demonstrate decreased fall risk during functional activities.    Baseline 9/13: 31/56 7/14: 33/56 07/28/19: 32/56 6/21: 32/56     Time 12    Period Weeks    Status Partially Met    Target Date 04/10/20                 Plan - 01/24/20 1358    Clinical Impression Statement Goals updated recently so no need to update today during session. At this time he has now met his goal for ambulation of 200 ft. His 10 MWT was last tested at 48s which is a slight decline from the previous couple attempts however signiifcantly improved from his times prior to April 2021.  He is demonstrating short term carryover in standing tasks while working toward goal of improved independence in the bathroom with toileting and washing. Patient will continue to benefit from skilled therapy  in order to increase strength and endurance. His condition has the potential to improve in response to therapy. Maximum improvement is yet to be obtained. The anticipated improvement is attainable and reasonable in a generally predictable time.  Patient reports he is getting more steady and able to stand without help better, still wants to work on balance due to his fall and walking more.    Rehab Potential Fair    Clinical Impairments Affecting Rehab Potential hx of HTN, HLD, CVA, learning disability, Diabetes, brain tumor,     PT Frequency 2x / week    PT Duration 12 weeks    PT Treatment/Interventions ADLs/Self Care Home Management;Aquatic Therapy;Ultrasound;Moist Heat;Traction;DME Instruction;Gait training;Stair  training;Functional mobility training;Therapeutic activities;Therapeutic exercise;Orthotic Fit/Training;Neuromuscular re-education;Balance training;Patient/family education;Manual techniques;Wheelchair mobility training;Passive range of motion;Energy conservation;Taping;Visual/perceptual remediation/compensation    PT Next Visit Plan progress standing balance    PT Home Exercise Plan TrA contraction, glute sets: needs updates. Potentially some time in supine flat in hospital bed, to improve hip extension deficits.     Consulted and Agree with Plan of Care Patient    Family Member Consulted mother           Patient will benefit from skilled therapeutic intervention in order to improve the following deficits and impairments:  Abnormal gait, Decreased activity tolerance, Decreased balance, Decreased knowledge of precautions, Decreased endurance, Decreased coordination, Decreased knowledge of use of DME, Decreased mobility, Decreased range of motion, Difficulty walking, Decreased safety awareness, Decreased strength, Impaired flexibility, Impaired perceived functional ability, Impaired tone, Postural dysfunction, Improper body mechanics, Pain  Visit Diagnosis: Muscle weakness (generalized)  Other abnormalities of gait and mobility     Problem List Patient Active Problem List   Diagnosis Date Noted  . Acute CVA (cerebrovascular accident) ([place]) 05/02/2016  . Left-sided weakness 05/01/2016   Curtis Powell PT, DPT, GCS  Curtis Powell 01/24/2020, 5:15 PM  [place] MAIN [facility] SERVICES [address] [place], [place], [address] Phone: [phone]   Fax:  [phone]  Name: Curtis Powell MRN: [id number] Date of Birth: 1965-09-13

## 2020-01-24 NOTE — Therapy (Signed)
[facility] [address] [place], [place], [address] Phone: [phone]   Fax:  [phone]  Occupational Therapy Treatment  Patient Details  Name: Curtis Powell MRN: [id number] Date of Birth: 12-03-1965 No data recorded  Encounter Date: 01/24/2020   OT End of Session - 01/24/20 1356    Visit Number [id number]    Number of Visits 258    Date for OT Re-Evaluation 04/03/20    Authorization Type Progress reporting period starting 09/15/2019    OT Start Time 1258    OT Stop Time 1342    OT Time Calculation (min) 44 min    Activity Tolerance Patient tolerated treatment well    Behavior During Therapy [facility] for tasks assessed/performed           Past Medical History:  Diagnosis Date  . GERD (gastroesophageal reflux disease)   . Hyperlipidemia   . Hypertension   . Obesity   . Stroke [facility])     Past Surgical History:  Procedure Laterality Date  . BRAIN SURGERY      There were no vitals filed for this visit.   Subjective Assessment - 01/24/20 1312    Subjective  Pt reports he had f/u appt on 10/14 with his PCP and started and antibiotic for his groin issues. Reports easier for him to get up out of bed and sit<>stand.    Pertinent History Pt. is a 54 y.o. male who suffered a CVA on 05/01/2016. [facility]. was admitted to the [facility]. Once discharged, he received Home Health PT and OT services for about a month. Pt. has had multiple CVAs over the past 8 years, and has had multiple falls in the past 6 months. Pt. resides in an apartment. Pt. has caregivers for 80 hours. Pt.'s mother stays with pt. at night, and assists with IADL tasks.    Patient Stated Goals To be able to throw a ball and dribble a ball, do as much as I can for myself.     Currently in Pain? No/denies    Pain Score 0-No pain           OT Treatment  Neuro Re-Ed OT engages pt in 5 min arm bike exercise to incorporate bilateral integration at start of session. Pt  tolerates well and requires 2 min rest break before engaging in other neuro re-education tasks. Ot engages pt in fine motor coordination tasks for which resistance clothes pins are utilized. Pt is 85% successful with R hand and 75% successful with L hand with clipping/removing clothes pins of varying resistance from medium to high. The level of resistance does not appear to impact his performance. OT engages pt in gross motor coordination tasks with ~8" diameter ball. Pt is 85% successful with two hand bounce pass, 70% successful with two hand chest pass, 60% successful with two hand dribble (for 3 consecutive bounces), and 40% successful with 1 hand dribble (for 3 consecutive bounces). Pt requires MIN verbal cues for pace, technique and to self-initiate rest breaks as needed.   Self Care  OT engages pt in education on how to don/doff foot rests to/from wheelchair to increase INDEP for negotiating small spaces and INDEP with prepping for ADL transfers or other LB activities. Pt requires MIN A to don R foot rest with retrieval of item/SETUP provided and MOD A to don L foot rest. Pt then is able to doff R foot rest with SUPV for verbal/tactile cues (20%) to feel for and  sequence loosening locking mechanism. Pt requires MOD A to doff L foot rest d/t weaker L side. Tried x3 each side. Increased time required for L side. Will continue to practice/trial with pt to increase his INDEP with prepping for low body ADLs/ADL transfers by getting foot rest out of way to establish BOS.                    OT Education - 01/24/20 1354    Education provided Yes    Education Details grip exercises, strengthening, donning/doffing foot rests from wheelchair.    Person(s) Educated Patient    Methods Explanation;Demonstration    Comprehension Verbalized understanding;Returned demonstration;Verbal cues required    Education Details exercise form, pace, technique. Technique for easier removal/replacement of foot  rests.    Person(s) Educated Patient    Methods Explanation;Demonstration;Tactile cues;Verbal cues    Comprehension Verbalized understanding;Returned demonstration;Verbal cues required;Tactile cues required            OT Short Term Goals - 03/03/17 1459      OT SHORT TERM GOAL #1   Title `             OT Long Term Goals - 01/10/20 1316      OT LONG TERM GOAL #1   Title Pt. will UE functioning to be able to perfrom toilet hygiene with minimal assistance.      OT LONG TERM GOAL #4   Title Pt. will perform self dressing with minA and A/E as needed.    Baseline Pt. continues to be able to donn a T shirt independently with increased time. MinA donning a jacket, Mod A donning pants over his feet, and socks, minA with shoes.    Time 12    Period Weeks    Status On-going    Target Date 04/03/20      OT LONG TERM GOAL #5   Title Pt. will perform light home making tasks with minA    Baseline Pt is able to wash dishes while seated in w/c with increased accessibility of new apartment. Pt. is able to fold his clothes, his moms puts them in the and out of the washer, and dryer, and puts them onto hangers. Caregivers perform vacuuming, and dusting.    Time 12    Period Weeks      Long Term Additional Goals   Additional Long Term Goals Yes      OT LONG TERM GOAL #6   Title Pt. will write his name efficiently with 100% legibility    Baseline Pt. continues to present with 75% legibility with increased time required when printing his name, and 50% legibility when cursive writing his name to sign in when riding the [place]    Time 12    Period Weeks    Target Date 04/03/20      OT LONG TERM GOAL #7   Title Pt will independently and consistently follow HEP to increase UE strength to increase functional independence    Baseline Pt. continues to work towards independence with exercises    Time 12    Period Weeks    Target Date 04/03/20      OT LONG TERM GOAL #8   Title Pt. will require  supervision ironing a shirt.    Baseline Pt. has progressed CGA, and complete set-up seated using a tabletop iron    Time 12    Period Weeks    Status On-going    Target Date 04/03/20  OT LONG TERM GOAL  #9   Baseline Pt. will independently be able to sew a button onto a shirt.    Time 12    Period Weeks    Target Date 04/03/20      OT LONG TERM GOAL  #10   TITLE Pt. will independently be able to throw a ball    Baseline Pt. continues to be limited with throwing overhand, however pt. is able to perform underhand throwing.    Time 12    Period Weeks    Status On-going    Target Date 04/03/20      OT LONG TERM GOAL  #11   TITLE Pt. will improve UE functional reaching to be able to independently use his RUE to hand his clothes in the closest.    Baseline Pt. continues to improve with reaching up with the RUE, however has difficulty reaching the hanger rack in the closet. Pt. now uses a reacher.    Time 12    Period Weeks    Status On-going      OT LONG TERM GOAL  #13   TITLE Pt. will increase BUE strength to be able to prepare for assisting with yardwork.    Baseline Pt. BUE strength 4+/5 Pt. reports that he has been able to get out to the front.    Time 12    Period Weeks    Status On-going      OT LONG TERM GOAL  #14   TITLE Pt. will improve activity toelrance in standing to be able to complete standing ADLs, and IADLs with SBA.    Baseline Pt. is limited with ADL performance in standing.    Time 12    Period Weeks    Status New    Target Date 04/03/20                 Plan - 01/24/20 1357    Clinical Impression Statement Pt continues to progress with reaching, grasping, activity tolerance, strength and general independence for attaining ADL items and performing self care tasks. OT engages pt in [facility] tasks including clipping resistance clothes pins for which he is 85% successful with L hand, 90% successful with R hand and requires increased time for this activity  overall. In addition, OT engages pt in seated dribbling/bouncing and passing exercises with ~8" diameter ball. Overall, pt tolerates session well. Reports decreased pain over this week and better general tolerance for activity. Will continue to follow per OT POC as pt continues to demo progress and continues to benefit from skilled OT.    OT Occupational Profile and History Problem Focused Assessment - Including review of records relating to presenting problem    Occupational performance deficits (Please refer to evaluation for details): ADL's;IADL's    Body Structure / Function / Physical Skills ADL;FMC;ROM;IADL;Pain;Coordination    Clinical Decision Making Several treatment options, min-mod task modification necessary    Comorbidities Affecting Occupational Performance: May have comorbidities impacting occupational performance    Modification or Assistance to Complete Evaluation  Min-Moderate modification of tasks or assist with assess necessary to complete eval    OT Frequency 2x / week    OT Duration 12 weeks    OT Treatment/Interventions Self-care/ADL training;Patient/family education;DME and/or AE instruction;Therapeutic exercise;Neuromuscular education    Plan continue with current OT plan of care. Pt would like to continue to work on getting leg rests on/off his w/c himself to increase indep.    OT Home Exercise Plan grip  strengthening    Consulted and Agree with Plan of Care Patient           Patient will benefit from skilled therapeutic intervention in order to improve the following deficits and impairments:   Body Structure / Function / Physical Skills: ADL, FMC, ROM, IADL, Pain, Coordination       Visit Diagnosis: Muscle weakness (generalized)  Other lack of coordination    Problem List Patient Active Problem List   Diagnosis Date Noted  . Acute CVA (cerebrovascular accident) ([place]) 05/02/2016  . Left-sided weakness 05/01/2016    Jake Church Kavion Mancinas 01/24/2020, 2:08  PM  [place] MAIN [facility] SERVICES [address] [place], [place], [address] Phone: [phone]   Fax:  [phone]  Name: ROXAS CLYMER MRN: [id number] Date of Birth: May 06, 1965

## 2020-01-26 ENCOUNTER — Other Ambulatory Visit: Payer: Self-pay

## 2020-01-26 ENCOUNTER — Encounter: Payer: Self-pay | Admitting: Occupational Therapy

## 2020-01-26 ENCOUNTER — Ambulatory Visit: Payer: Medicare HMO | Admitting: Occupational Therapy

## 2020-01-26 ENCOUNTER — Ambulatory Visit: Payer: Medicare HMO

## 2020-01-26 DIAGNOSIS — M6281 Muscle weakness (generalized): Secondary | ICD-10-CM | POA: Diagnosis not present

## 2020-01-26 DIAGNOSIS — R2689 Other abnormalities of gait and mobility: Secondary | ICD-10-CM

## 2020-01-26 DIAGNOSIS — R531 Weakness: Secondary | ICD-10-CM

## 2020-01-26 NOTE — Therapy (Signed)
[place] MAIN [facility] SERVICES [address] [place], [place], [address] Phone: [phone]   Fax:  [phone]  Occupational Therapy Treatment  Patient Details  Name: Curtis Powell MRN: [id number] Date of Birth: Mar 27, 1966 No data recorded  Encounter Date: 01/26/2020   OT End of Session - 01/26/20 1409    Visit Number [id number]    Number of Visits 258    Date for OT Re-Evaluation 04/03/20    Authorization Type Progress reporting period starting 09/15/2019    OT Start Time 1347    OT Stop Time 1430    OT Time Calculation (min) 43 min    Activity Tolerance Patient tolerated treatment well    Behavior During Therapy [facility] for tasks assessed/performed           Past Medical History:  Diagnosis Date  . GERD (gastroesophageal reflux disease)   . Hyperlipidemia   . Hypertension   . Obesity   . Stroke [facility])     Past Surgical History:  Procedure Laterality Date  . BRAIN SURGERY      OT TREATMENT    Neuro muscular re-education:  Pt. worked on [facility] skills manipulating nuts, and bolts on a bolt board. Pt. worked on screwing, and unscrewing nuts, and bolts of varying sizes, and challenging progressively smaller items. Pt. worked on using his left hand for unscrewing the nuts, and bolts with his vision occluded.    Therapeutic Exercise:  Pt. worked on the [employer]	[place] for strengthening, and reciprocal motion. Pt. worked out for 8 min. with moderate resistance while seated. Pt. required verbal cues for head, and neck alignment while peforming the UBE.  Pt. continues to make steady progress overall, and is now able to reach up to take his coat off of a hook. Pt. continues to present with UE weakness, and limited coordination. Pt. requires verbal cues for hand placement, and to use his left hand during bilateral hand tasks.  Pt. requires multiple rest breaks. Pt. continues to work on improving BUE strength, and [facility] skills in order to work towards improving  BUE functioning [place] with ADLs, and IADLs.                         OT Education - 01/26/20 1409    Education provided Yes    Education Details grip exercises, strengthening, donning/doffing foot rests from wheelchair.    Person(s) Educated Patient    Methods Explanation;Demonstration    Comprehension Verbalized understanding;Returned demonstration;Verbal cues required            OT Short Term Goals - 03/03/17 1459      OT SHORT TERM GOAL #1   Title `             OT Long Term Goals - 01/10/20 1316      OT LONG TERM GOAL #1   Title Pt. will UE functioning to be able to perfrom toilet hygiene with minimal assistance.      OT LONG TERM GOAL #4   Title Pt. will perform self dressing with minA and A/E as needed.    Baseline Pt. continues to be able to donn a T shirt independently with increased time. MinA donning a jacket, Mod A donning pants over his feet, and socks, minA with shoes.    Time 12    Period Weeks    Status On-going    Target Date 04/03/20      OT  LONG TERM GOAL #5   Title Pt. will perform light home making tasks with minA    Baseline Pt is able to wash dishes while seated in w/c with increased accessibility of new apartment. Pt. is able to fold his clothes, his moms puts them in the and out of the washer, and dryer, and puts them onto hangers. Caregivers perform vacuuming, and dusting.    Time 12    Period Weeks      Long Term Additional Goals   Additional Long Term Goals Yes      OT LONG TERM GOAL #6   Title Pt. will write his name efficiently with 100% legibility    Baseline Pt. continues to present with 75% legibility with increased time required when printing his name, and 50% legibility when cursive writing his name to sign in when riding the [employer]    Time 12    Period Weeks    Target Date 04/03/20      OT LONG TERM GOAL #7   Title Pt will independently and consistently follow HEP to increase UE  strength to increase functional independence    Baseline Pt. continues to work towards independence with exercises    Time 12    Period Weeks    Target Date 04/03/20      OT LONG TERM GOAL #8   Title Pt. will require supervision ironing a shirt.    Baseline Pt. has progressed CGA, and complete set-up seated using a tabletop iron    Time 12    Period Weeks    Status On-going    Target Date 04/03/20      OT LONG TERM GOAL  #9   Baseline Pt. will independently be able to sew a button onto a shirt.    Time 12    Period Weeks    Target Date 04/03/20      OT LONG TERM GOAL  #10   TITLE Pt. will independently be able to throw a ball    Baseline Pt. continues to be limited with throwing overhand, however pt. is able to perform underhand throwing.    Time 12    Period Weeks    Status On-going    Target Date 04/03/20      OT LONG TERM GOAL  #11   TITLE Pt. will improve UE functional reaching to be able to independently use his RUE to hand his clothes in the closest.    Baseline Pt. continues to improve with reaching up with the RUE, however has difficulty reaching the hanger rack in the closet. Pt. now uses a reacher.    Time 12    Period Weeks    Status On-going      OT LONG TERM GOAL  #13   TITLE Pt. will increase BUE strength to be able to prepare for assisting with yardwork.    Baseline Pt. BUE strength 4+/5 Pt. reports that he has been able to get out to the front.    Time 12    Period Weeks    Status On-going      OT LONG TERM GOAL  #14   TITLE Pt. will improve activity [place] in standing to be able to complete standing ADLs, and IADLs with SBA.    Baseline Pt. is limited with ADL performance in standing.    Time 12    Period Weeks    Status New    Target Date 04/03/20  Plan - 01/26/20 1413    Clinical Impression Statement Pt. continues to make steady progress overall, and is now able to reach up to take his coat off of a hook. Pt. continues  to present with UE weakness, and limited coordination. Pt. requires verbal cues for hand placement, and to use his left hand during bilateral hand tasks.  Pt. requires multiple rest breaks. Pt. continues to work on improving BUE strength, and [facility] skills in order to work towards improving BUE functioning [place] with ADLs, and IADLs.   OT Occupational Profile and History Problem Focused Assessment - Including review of records relating to presenting problem    Occupational performance deficits (Please refer to evaluation for details): ADL's;IADL's    Body Structure / Function / Physical Skills ADL;FMC;ROM;IADL;Pain;Coordination    Rehab Potential Fair    Clinical Decision Making Several treatment options, min-mod task modification necessary    Comorbidities Affecting Occupational Performance: May have comorbidities impacting occupational performance    Modification or Assistance to Complete Evaluation  Min-Moderate modification of tasks or assist with assess necessary to complete eval    OT Frequency 2x / week    OT Duration 12 weeks    OT Treatment/Interventions Self-care/ADL training;Patient/family education;DME and/or AE instruction;Therapeutic exercise;Neuromuscular education    Plan continue with current OT plan of care. Pt would like to continue to work on getting leg rests on/off his w/c himself to increase indep.    Consulted and Agree with Plan of Care Patient           Patient will benefit from skilled therapeutic intervention in order to improve the following deficits and impairments:   Body Structure / Function / Physical Skills: ADL, FMC, ROM, IADL, Pain, Coordination       Visit Diagnosis: Muscle weakness (generalized)    Problem List Patient Active Problem List   Diagnosis Date Noted  . Acute CVA (cerebrovascular accident) ([place]) 05/02/2016  . Left-sided weakness 05/01/2016    Harrel Carina, MS, OTR/L 01/26/2020, 2:15 PM  [place] MAIN [facility] SERVICES [address] [place], [place], [address] Phone: [phone]   Fax:  [phone]  Name: Curtis Powell MRN: [id number] Date of Birth: 1965-08-03

## 2020-01-26 NOTE — Therapy (Signed)
[place] MAIN [facility] SERVICES [address] [place], [place], [address] Phone: [phone]   Fax:  [phone]  Physical Therapy Treatment  Patient Details  Name: Curtis Powell MRN: [id number] Date of Birth: [birth year] No data recorded  Encounter Date: 01/26/2020   PT End of Session - 01/26/20 1532    Visit Number [id number]    Number of Visits 253    Date for PT Re-Evaluation 04/10/20    Authorization Type 1/10; PN 01/24/20    Authorization Time Period authorized 11/16/18-05/19/19    PT Start Time 1430    PT Stop Time 1514    PT Time Calculation (min) 44 min    Equipment Utilized During Treatment Gait belt    Activity Tolerance Patient tolerated treatment well;No increased pain    Behavior During Therapy WFL for tasks assessed/performed           Past Medical History:  Diagnosis Date  . GERD (gastroesophageal reflux disease)   . Hyperlipidemia   . Hypertension   . Obesity   . Stroke [facility])     Past Surgical History:  Procedure Laterality Date  . BRAIN SURGERY      There were no vitals filed for this visit.   Subjective Assessment - 01/26/20 1433    Subjective Patient denies falls or LOB since last session. Notes compliance with HEP without provocation of pain    Pertinent History  Patient is a pleasant 54 year old male who presents to physical therapy for weakness and immobility secondary to CVA.  Had a stroke in Feb 2018. He was previously fully independent, but this stroke caused severe residual deficits, mainly on the right side as well as speech, and now he is unable to walk or perform most of his ADLs on his own. Entire right side is very weak. He still has a little difficulty with speech but his swallowing is improved to baseline. His mom had to move in with him and now is his main caregiver.     Limitations Sitting;Lifting;Standing;Walking;House hold activities    How long can you sit comfortably? 5 minutes    How long can you  stand comfortably? 5 minutes    How long can you walk comfortably? 10 ft    Patient Stated Goals Walk without walker, walk further with walker, get some strength in back     Currently in Pain? No/denies          Treatment:  Standing in // Bars: Step up onto foam pad with BUE support on bars. Chair behind for safety. Patient demonstrates inc effort for R foot clearance onto pad. Performed 10x with inc time and effort. Patient performance improves with repetition.  Balloon taps with chair behind and CGA for safety. Patient demonstrates posterior displacement x  3 with min A required from PT to stabilize.  Ball kicks with BUE support for BLE coordination and sequencing of movement. Patient able to use hip and step strategy to stabilize. d0 kicks on each leg. PT cues for correct timing to kick ball effectively, as pt would kick too soon.  A/P weight shifts in staggered stance. Patient demonstrates good upright posture throughout task. 10x each leg (20x total)   Seated: Push outs with blue medicine ball. PT cues for sustained core activation. Performed 10x  Overhead press with blue medicine ball. PT cues for core activation and upright posture throughout task. Performed 10x.  Pt educated throughout session about proper posture and technique with exercises.  Improved exercise technique, movement at target joints, use of target muscles after min to mod verbal, visual, tactile cues.                           PT Education - 01/26/20 1435    Education provided Yes    Education Details exercise technique, body mechanics    Person(s) Educated Patient    Methods Explanation;Demonstration;Verbal cues;Tactile cues    Comprehension Tactile cues required;Verbalized understanding;Returned demonstration;Verbal cues required            PT Short Term Goals - 01/24/20 1711      PT SHORT TERM GOAL #1   Title Patient will be independent in home exercise program to improve  strength/mobility for better functional independence with ADLs.    Baseline hep compliant    Time 2    Period Weeks    Status Achieved      PT SHORT TERM GOAL #2   Title Patient will require min cueing for STS transfer with CGA for increased independence with mobility.     Baseline CGA    Time 2    Period Weeks    Status Achieved      PT SHORT TERM GOAL #3   Title Patient will maintain upright posture for > 15 seconds to demonstrate strengthened postural control muscualture     Baseline 18 seconds     Time 2    Period Weeks    Status Achieved             PT Long Term Goals - 01/24/20 1711      PT LONG TERM GOAL #1   Title Patient will perform bed mobility (rolling and supine <>sit) with Mod I  to increase mobility and  perform ADLs.     Baseline 9/13: able to perform with mod I occasional Min A when very fatigued 7/14: Min A 4/21: unable to perform 6/15: improving but still min-mod A     Time 12    Period Weeks    Status Partially Met    Target Date 04/10/20      PT LONG TERM GOAL #2   Title Patient (< 28 years old) will complete five times sit to stand test in < 10 seconds indicating an increased LE strength and improved balance.    Baseline  9/13: 14 seconds with one near LOB 7/14: 14.31 seconds 6/21: 16.86 seconds 4/21: 18.8 seconds 06/02/19: 19.93 seconds one hand on RW, one hand on w/c 12/30: 16 secpmds BUE support 02/11/19: 36 seconds 5/19: 20.6s from [facility], one hand on walker and one on WC, 80% upright posture 7/1: 22 seconds one hand on walker one hand on w/c  9/14: 48 seconds with full upright posture    Time 12    Period Weeks    Status Partially Met    Target Date 04/10/20      PT LONG TERM GOAL #3   Title Patient will negotiate a bathroom with a walker and Min A for increased indep in home and external environment    Baseline  9/13: able to some in standing, requires sitting down to wash hands 7/14: unable to do     Time 12    Period Weeks    Status Partially  Met    Target Date 04/10/20      PT LONG TERM GOAL #4   Title Patient will increase BLE gross strength to 4+/5 as to improve  functional strength for independent gait, increased standing tolerance and increased ADL ability.    Baseline  4/21: see note 06/02/19: hip flexion 4-/5, extension/abd 3/5 12/30: hip flexion 4/5 hip extension and abduction 3/5 02/11/19: hip flexion 4/5 hip extension and abduction 2+/54-/5 RLE, 10/15: 4/5 RLE 11/7: 4/5 RLE 3/3: 4/5 7/1:  hip flexion 4/5 hip extension 2/5 hip abduction/adduciton 2/5     Time 12    Period Weeks    Status Partially Met    Target Date 04/10/20      PT LONG TERM GOAL #6   Title Patient will ambulate 200 ft with least assistive device and Supervision with no breaks to allow for increased mobility within home.    Baseline 9/13: performed in previous session 7/14: 190 ft with RW 4/21: ambulate 150 ft 6/21: had to terminate after 90 ft due to cardiac     Time 12    Period Weeks    Status Achieved      PT LONG TERM GOAL #7   Title Patient will perform 10 MWT in <30 seconds for improved gait speed, gait mechanics, and functional capacity for mobility.     Baseline 10/11: 48s with BRW and WC follow; 9/13: terminated due to wardrobe malfunction 9/8: 42 seconds with R foot collapse 7/14: 37.98 seconds 6/21: 46.9 seconds with RW 07/28/19: 51 seconds with RW 06/02/19: 62.5 seconds with RW 11/4: 56 seconds with AFO and RW 12/30: 53 seconds with AFO and RW     Time 12    Period Weeks    Status Partially Met    Target Date 04/10/20      PT LONG TERM GOAL #8   Title Patient will increase Berg Balance score by > 6 points (38/56) to demonstrate decreased fall risk during functional activities.    Baseline 9/13: 31/56 7/14: 33/56 07/28/19: 32/56 6/21: 32/56     Time 12    Period Weeks    Status Partially Met    Target Date 04/10/20                 Plan - 01/26/20 1534    Clinical Impression Statement Patient able to perform standing exercises  with improved upright posture, requiring fewer cues from PT. Patient displays improved ability to stabilize independently using reactive strategies during instances of near LOB. Patient continues to displace posteriorly when LOB does occur. Patient demonstrating improved activity tolerance and strength, evidenced by no provocation of pain throughout session. Pt would continue to benefit from skilled PT services in order to further strengthen LE's, improve static and dynamic balance, and improve coordination in order to increase functional mobility and decrease risk of falls    Rehab Potential Fair    Clinical Impairments Affecting Rehab Potential hx of HTN, HLD, CVA, learning disability, Diabetes, brain tumor,     PT Frequency 2x / week    PT Duration 12 weeks    PT Treatment/Interventions ADLs/Self Care Home Management;Aquatic Therapy;Ultrasound;Moist Heat;Traction;DME Instruction;Gait training;Stair training;Functional mobility training;Therapeutic activities;Therapeutic exercise;Orthotic Fit/Training;Neuromuscular re-education;Balance training;Patient/family education;Manual techniques;Wheelchair mobility training;Passive range of motion;Energy conservation;Taping;Visual/perceptual remediation/compensation    PT Next Visit Plan progress standing balance    PT Home Exercise Plan TrA contraction, glute sets: needs updates. Potentially some time in supine flat in hospital bed, to improve hip extension deficits.     Consulted and Agree with Plan of Care Patient    Family Member Consulted mother           Patient will benefit from skilled  therapeutic intervention in order to improve the following deficits and impairments:  Abnormal gait, Decreased activity tolerance, Decreased balance, Decreased knowledge of precautions, Decreased endurance, Decreased coordination, Decreased knowledge of use of DME, Decreased mobility, Decreased range of motion, Difficulty walking, Decreased safety awareness, Decreased  strength, Impaired flexibility, Impaired perceived functional ability, Impaired tone, Postural dysfunction, Improper body mechanics, Pain  Visit Diagnosis: Muscle weakness (generalized)  Other abnormalities of gait and mobility  Left-sided weakness     Problem List Patient Active Problem List   Diagnosis Date Noted  . Acute CVA (cerebrovascular accident) ([place]) 05/02/2016  . Left-sided weakness 05/01/2016   Tonny Bollman, SPT  This entire session was performed under direct supervision and direction of a licensed therapist/therapist assistant . I have personally read, edited and approve of the note as written.  Janna Arch, PT, DPT   01/26/2020, 3:36 PM  [place] MAIN [facility] SERVICES [address] [place], [place], [address] Phone: [phone]   Fax:  [phone]  Name: Curtis Powell MRN: [id number] Date of Birth: 1965-12-05

## 2020-01-31 ENCOUNTER — Ambulatory Visit: Payer: Medicare HMO

## 2020-01-31 ENCOUNTER — Ambulatory Visit: Payer: Medicare HMO | Admitting: Occupational Therapy

## 2020-01-31 ENCOUNTER — Other Ambulatory Visit: Payer: Self-pay

## 2020-01-31 ENCOUNTER — Encounter: Payer: Self-pay | Admitting: Occupational Therapy

## 2020-01-31 DIAGNOSIS — M6281 Muscle weakness (generalized): Secondary | ICD-10-CM

## 2020-01-31 DIAGNOSIS — R278 Other lack of coordination: Secondary | ICD-10-CM

## 2020-01-31 DIAGNOSIS — R531 Weakness: Secondary | ICD-10-CM

## 2020-01-31 DIAGNOSIS — R2689 Other abnormalities of gait and mobility: Secondary | ICD-10-CM

## 2020-01-31 NOTE — Therapy (Signed)
[place] MAIN [facility] SERVICES [address] [place], [place], [address] Phone: [phone]   Fax:  [phone]  Physical Therapy Treatment  Patient Details  Name: Curtis Powell MRN: [id number] Date of Birth: [birth year] No data recorded  Encounter Date: 01/31/2020   PT End of Session - 01/31/20 1344    Visit Number [id number]    Number of Visits 253    Date for PT Re-Evaluation 04/10/20    Authorization Type 2/10; PN 01/24/20    Authorization Time Period authorized 11/16/18-05/19/19    PT Start Time 1344    PT Stop Time 1429    PT Time Calculation (min) 45 min    Equipment Utilized During Treatment Gait belt    Activity Tolerance Patient tolerated treatment well;No increased pain    Behavior During Therapy WFL for tasks assessed/performed           Past Medical History:  Diagnosis Date  . GERD (gastroesophageal reflux disease)   . Hyperlipidemia   . Hypertension   . Obesity   . Stroke [facility])     Past Surgical History:  Procedure Laterality Date  . BRAIN SURGERY      There were no vitals filed for this visit.   Subjective Assessment - 01/31/20 1341    Subjective Patient notes compliance with HEP without provocation of pain. Denies falls or LOB since last session.    Pertinent History  Patient is a pleasant 54 year old male who presents to physical therapy for weakness and immobility secondary to CVA.  Had a stroke in Feb 2018. He was previously fully independent, but this stroke caused severe residual deficits, mainly on the right side as well as speech, and now he is unable to walk or perform most of his ADLs on his own. Entire right side is very weak. He still has a little difficulty with speech but his swallowing is improved to baseline. His mom had to move in with him and now is his main caregiver.     Limitations Sitting;Lifting;Standing;Walking;House hold activities    How long can you sit comfortably? 5 minutes    How long can you  stand comfortably? 5 minutes    How long can you walk comfortably? 10 ft    Patient Stated Goals Walk without walker, walk further with walker, get some strength in back     Currently in Pain? No/denies           Treatment:  Standing in // Bars: Step up onto foam pad with BUE support on bars. Chair behind for safety. Patient demonstrates inc effort for R foot clearance onto pad. Performed 10x with inc time and effort. Patient performance improves with repetition.  4" step taps with BUE support. Patient requires tactile and verbal cues for inc hip flexion in order to clear step. 12x each leg.  Balloon taps with chair behind and CGA for safety. Patient demonstrates posterior displacement x  3 with mod A required from PT to stabilize.  A/P weight shifts in staggered stance. Patient demonstrates good upright posture throughout task. 10x each leg (20x total)  M/L weight shifts to promote equal weight distribution. Performed 10x   Seated: Forward/backward reach and pass with rainbow ball. Performed 12x each way (24x total)  Push outs with blue medicine ball. PT cues for sustained core activation. Performed 10x  Overhead press with blue medicine ball. PT cues for core activation and upright posture throughout task. Performed 10x.  Standing with RW:  Chest passes with rainbow ball without UE support. Patient demonstrates posterior LOB x 3 requiring PT cueing to re-establish balance. Performed 10x  Pt educated throughout session about proper posture and technique with exercises. Improved exercise technique, movement at target joints, use of target muscles after min to mod verbal, visual, tactile cues.                          PT Education - 01/31/20 1605    Education provided Yes    Education Details body mechanics, exercise technique, breathing    Person(s) Educated Patient    Methods Explanation;Demonstration;Tactile cues;Verbal cues    Comprehension  Verbalized understanding;Tactile cues required;Returned demonstration;Verbal cues required            PT Short Term Goals - 01/24/20 1711      PT SHORT TERM GOAL #1   Title Patient will be independent in home exercise program to improve strength/mobility for better functional independence with ADLs.    Baseline hep compliant    Time 2    Period Weeks    Status Achieved      PT SHORT TERM GOAL #2   Title Patient will require min cueing for STS transfer with CGA for increased independence with mobility.     Baseline CGA    Time 2    Period Weeks    Status Achieved      PT SHORT TERM GOAL #3   Title Patient will maintain upright posture for > 15 seconds to demonstrate strengthened postural control muscualture     Baseline 18 seconds     Time 2    Period Weeks    Status Achieved             PT Long Term Goals - 01/24/20 1711      PT LONG TERM GOAL #1   Title Patient will perform bed mobility (rolling and supine <>sit) with Mod I  to increase mobility and  perform ADLs.     Baseline 9/13: able to perform with mod I occasional Min A when very fatigued 7/14: Min A 4/21: unable to perform 6/15: improving but still min-mod A     Time 12    Period Weeks    Status Partially Met    Target Date 04/10/20      PT LONG TERM GOAL #2   Title Patient (< 26 years old) will complete five times sit to stand test in < 10 seconds indicating an increased LE strength and improved balance.    Baseline  9/13: 14 seconds with one near LOB 7/14: 14.31 seconds 6/21: 16.86 seconds 4/21: 18.8 seconds 06/02/19: 19.93 seconds one hand on RW, one hand on w/c 12/30: 16 secpmds BUE support 02/11/19: 36 seconds 5/19: 20.6s from [facility], one hand on walker and one on WC, 80% upright posture 7/1: 22 seconds one hand on walker one hand on w/c  9/14: 48 seconds with full upright posture    Time 12    Period Weeks    Status Partially Met    Target Date 04/10/20      PT LONG TERM GOAL #3   Title Patient will  negotiate a bathroom with a walker and Min A for increased indep in home and external environment    Baseline  9/13: able to some in standing, requires sitting down to wash hands 7/14: unable to do     Time 12    Period Weeks  Status Partially Met    Target Date 04/10/20      PT LONG TERM GOAL #4   Title Patient will increase BLE gross strength to 4+/5 as to improve functional strength for independent gait, increased standing tolerance and increased ADL ability.    Baseline  4/21: see note 06/02/19: hip flexion 4-/5, extension/abd 3/5 12/30: hip flexion 4/5 hip extension and abduction 3/5 02/11/19: hip flexion 4/5 hip extension and abduction 2+/54-/5 RLE, 10/15: 4/5 RLE 11/7: 4/5 RLE 3/3: 4/5 7/1:  hip flexion 4/5 hip extension 2/5 hip abduction/adduciton 2/5     Time 12    Period Weeks    Status Partially Met    Target Date 04/10/20      PT LONG TERM GOAL #6   Title Patient will ambulate 200 ft with least assistive device and Supervision with no breaks to allow for increased mobility within home.    Baseline 9/13: performed in previous session 7/14: 190 ft with RW 4/21: ambulate 150 ft 6/21: had to terminate after 90 ft due to cardiac     Time 12    Period Weeks    Status Achieved      PT LONG TERM GOAL #7   Title Patient will perform 10 MWT in <30 seconds for improved gait speed, gait mechanics, and functional capacity for mobility.     Baseline 10/11: 48s with BRW and WC follow; 9/13: terminated due to wardrobe malfunction 9/8: 42 seconds with R foot collapse 7/14: 37.98 seconds 6/21: 46.9 seconds with RW 07/28/19: 51 seconds with RW 06/02/19: 62.5 seconds with RW 11/4: 56 seconds with AFO and RW 12/30: 53 seconds with AFO and RW     Time 12    Period Weeks    Status Partially Met    Target Date 04/10/20      PT LONG TERM GOAL #8   Title Patient will increase Berg Balance score by > 6 points (38/56) to demonstrate decreased fall risk during functional activities.    Baseline 9/13:  31/56 7/14: 33/56 07/28/19: 32/56 6/21: 32/56     Time 12    Period Weeks    Status Partially Met    Target Date 04/10/20                 Plan - 01/31/20 1448    Clinical Impression Statement Patient continues to demonstrate good motivation during treatment session. Patient continues to display difficulty with clearing RLE over obstacles, but responds well to tactile cueing from PT. Patient requires cues this session to re-establish balance during standing balance tasks, as pt would continue through intervention while displacing posteriorly. Pt would continue to benefit from skilled PT services in order to further strengthen LE's, improve static and dynamic balance, and improve coordination in order to increase functional mobility and decrease risk of falls    Rehab Potential Fair    Clinical Impairments Affecting Rehab Potential hx of HTN, HLD, CVA, learning disability, Diabetes, brain tumor,     PT Frequency 2x / week    PT Duration 12 weeks    PT Treatment/Interventions ADLs/Self Care Home Management;Aquatic Therapy;Ultrasound;Moist Heat;Traction;DME Instruction;Gait training;Stair training;Functional mobility training;Therapeutic activities;Therapeutic exercise;Orthotic Fit/Training;Neuromuscular re-education;Balance training;Patient/family education;Manual techniques;Wheelchair mobility training;Passive range of motion;Energy conservation;Taping;Visual/perceptual remediation/compensation    PT Next Visit Plan progress standing balance    PT Home Exercise Plan TrA contraction, glute sets: needs updates. Potentially some time in supine flat in hospital bed, to improve hip extension deficits.     Consulted and Agree  with Plan of Care Patient    Family Member Consulted mother           Patient will benefit from skilled therapeutic intervention in order to improve the following deficits and impairments:  Abnormal gait, Decreased activity tolerance, Decreased balance, Decreased knowledge  of precautions, Decreased endurance, Decreased coordination, Decreased knowledge of use of DME, Decreased mobility, Decreased range of motion, Difficulty walking, Decreased safety awareness, Decreased strength, Impaired flexibility, Impaired perceived functional ability, Impaired tone, Postural dysfunction, Improper body mechanics, Pain  Visit Diagnosis: Muscle weakness (generalized)  Other abnormalities of gait and mobility  Left-sided weakness     Problem List Patient Active Problem List   Diagnosis Date Noted  . Acute CVA (cerebrovascular accident) ([place]) 05/02/2016  . Left-sided weakness 05/01/2016    This entire session was performed under direct supervision and direction of a licensed therapist/therapist assistant . I have personally read, edited and approve of the note as written.    Tonny Bollman, SPT Phillips Grout PT, DPT, GCS  01/31/2020, 4:36 PM  [place] MAIN [facility] SERVICES [address] [place], [place], [address] Phone: [phone]   Fax:  [phone]  Name: Curtis Powell MRN: [id number] Date of Birth: 06-08-1965

## 2020-01-31 NOTE — Therapy (Signed)
[place] MAIN [facility] SERVICES [address] [place], [place], [address] Phone: [phone]   Fax:  [phone]  Occupational Therapy Treatment  Patient Details  Name: Curtis Powell MRN: [id number] Date of Birth: 07-28-1965 No data recorded  Encounter Date: 01/31/2020   OT End of Session - 01/31/20 1535    Visit Number [id number]    Number of Visits 258    Date for OT Re-Evaluation 04/03/20    Authorization Type Progress reporting period starting 09/15/2019    OT Start Time 1302    OT Stop Time 1342    OT Time Calculation (min) 40 min    Equipment Utilized During Treatment 4# and 5# weighted dowels    Activity Tolerance Patient tolerated treatment well    Behavior During Therapy WFL for tasks assessed/performed           Past Medical History:  Diagnosis Date  . GERD (gastroesophageal reflux disease)   . Hyperlipidemia   . Hypertension   . Obesity   . Stroke [facility])     Past Surgical History:  Procedure Laterality Date  . BRAIN SURGERY      There were no vitals filed for this visit.   Subjective Assessment - 01/31/20 1534    Subjective  Pt reports doing well and finishing medication for his groin issues recently and feeling much better.    Pertinent History Pt. is a 54 y.o. male who suffered a CVA on 05/01/2016. Pt. was admitted to the hospital. Once discharged, he received Home Health PT and OT services for about a month. Pt. has had multiple CVAs over the past 8 years, and has had multiple falls in the past 6 months. Pt. resides in an apartment. Pt. has caregivers for 80 hours. Pt.'s mother stays with pt. at night, and assists with IADL tasks.    Patient Stated Goals To be able to throw a ball and dribble a ball, do as much as I can for myself.     Currently in Pain? No/denies             OT Treatment  There-Ex Pt completed 18min on the BUE UBE for strengthening, and reciprocal motion with moderate resistance while seated. Pt  also instructed in BUE ex with 4# weighted dowel for 4 x 5 reps of chest press, 2 x 5 reps shoulder raises, 1 x10 reps and 3 x5 reps vertical chest press; 2x10 reps of bicep curls with 5# weighted dowel. Pt required occasional cues for improved technique.   Neuro Re-ed Pt performed a dynamic reaching and grasping neuro re-ed activity involving crossing midline, trunk rotation and flexion, sustained grasp and release, requiring pt to grasp various sized velcro items attached to a horizontal and vertical boards requiring occasional cues for forward flexion.                    OT Education - 01/31/20 1534    Education provided Yes    Education Details grip exercises, strengthening    Person(s) Educated Patient    Methods Explanation;Demonstration    Comprehension Verbalized understanding;Returned demonstration;Verbal cues required            OT Short Term Goals - 03/03/17 1459      OT SHORT TERM GOAL #1   Title `             OT Long Term Goals - 01/10/20 1316      OT LONG TERM GOAL #1  Title Pt. will UE functioning to be able to perfrom toilet hygiene with minimal assistance.      OT LONG TERM GOAL #4   Title Pt. will perform self dressing with minA and A/E as needed.    Baseline Pt. continues to be able to donn a T shirt independently with increased time. MinA donning a jacket, Mod A donning pants over his feet, and socks, minA with shoes.    Time 12    Period Weeks    Status On-going    Target Date 04/03/20      OT LONG TERM GOAL #5   Title Pt. will perform light home making tasks with minA    Baseline Pt is able to wash dishes while seated in w/c with increased accessibility of new apartment. Pt. is able to fold his clothes, his moms puts them in the and out of the washer, and dryer, and puts them onto hangers. Caregivers perform vacuuming, and dusting.    Time 12    Period Weeks      Long Term Additional Goals   Additional Long Term Goals Yes      OT  LONG TERM GOAL #6   Title Pt. will write his name efficiently with 100% legibility    Baseline Pt. continues to present with 75% legibility with increased time required when printing his name, and 50% legibility when cursive writing his name to sign in when riding the [employer]    Time 12    Period Weeks    Target Date 04/03/20      OT LONG TERM GOAL #7   Title Pt will independently and consistently follow HEP to increase UE strength to increase functional independence    Baseline Pt. continues to work towards independence with exercises    Time 12    Period Weeks    Target Date 04/03/20      OT LONG TERM GOAL #8   Title Pt. will require supervision ironing a shirt.    Baseline Pt. has progressed CGA, and complete set-up seated using a tabletop iron    Time 12    Period Weeks    Status On-going    Target Date 04/03/20      OT LONG TERM GOAL  #9   Baseline Pt. will independently be able to sew a button onto a shirt.    Time 12    Period Weeks    Target Date 04/03/20      OT LONG TERM GOAL  #10   TITLE Pt. will independently be able to throw a ball    Baseline Pt. continues to be limited with throwing overhand, however pt. is able to perform underhand throwing.    Time 12    Period Weeks    Status On-going    Target Date 04/03/20      OT LONG TERM GOAL  #11   TITLE Pt. will improve UE functional reaching to be able to independently use his RUE to hand his clothes in the closest.    Baseline Pt. continues to improve with reaching up with the RUE, however has difficulty reaching the hanger rack in the closet. Pt. now uses a reacher.    Time 12    Period Weeks    Status On-going      OT LONG TERM GOAL  #13   TITLE Pt. will increase BUE strength to be able to prepare for assisting with yardwork.    Baseline Pt. BUE strength 4+/5 Pt.  reports that he has been able to get out to the front.    Time 12    Period Weeks    Status On-going      OT LONG TERM GOAL  #14   TITLE Pt. will  improve activity toelrance in standing to be able to complete standing ADLs, and IADLs with SBA.    Baseline Pt. is limited with ADL performance in standing.    Time 12    Period Weeks    Status New    Target Date 04/03/20                 Plan - 01/31/20 1536    Clinical Impression Statement Pt continues to make steady progress overall. Pt tolerated increased weights today. Pt continues to present with UE weakness and limited coordation impacting his functional independence with ADL and IADL tasks. Pt requires occasional visual and/or verbal cues for technique during exercise and rest breaks. Pt continues to benefit from skilled OT services to improve BUE strength and [facility] skills in order to improve functional independence.    OT Occupational Profile and History Problem Focused Assessment - Including review of records relating to presenting problem    Occupational performance deficits (Please refer to evaluation for details): ADL's;IADL's    Body Structure / Function / Physical Skills ADL;FMC;ROM;IADL;Pain;Coordination    Rehab Potential Fair    Clinical Decision Making Several treatment options, min-mod task modification necessary    Comorbidities Affecting Occupational Performance: May have comorbidities impacting occupational performance    Modification or Assistance to Complete Evaluation  Min-Moderate modification of tasks or assist with assess necessary to complete eval    OT Frequency 2x / week    OT Duration 12 weeks    OT Treatment/Interventions Self-care/ADL training;Patient/family education;DME and/or AE instruction;Therapeutic exercise;Neuromuscular education    Plan continue with current OT plan of care. Pt would like to continue to get stronger and be more independent.    Consulted and Agree with Plan of Care Patient           Patient will benefit from skilled therapeutic intervention in order to improve the following deficits and impairments:   Body Structure /  Function / Physical Skills: ADL, FMC, ROM, IADL, Pain, Coordination       Visit Diagnosis: Muscle weakness (generalized)  Other lack of coordination    Problem List Patient Active Problem List   Diagnosis Date Noted  . Acute CVA (cerebrovascular accident) ([place]) 05/02/2016  . Left-sided weakness 05/01/2016    Jeni Salles, MPH, MS, OTR/L ascom [phone] 01/31/20, 3:47 PM   [place] MAIN [facility] SERVICES [address] [place], [place], [address] Phone: [phone]   Fax:  [phone]  Name: Curtis Powell MRN: [id number] Date of Birth: 06-25-1965

## 2020-02-02 ENCOUNTER — Ambulatory Visit: Payer: Medicare HMO | Admitting: Physical Therapy

## 2020-02-02 ENCOUNTER — Encounter: Payer: Self-pay | Admitting: Occupational Therapy

## 2020-02-02 ENCOUNTER — Other Ambulatory Visit: Payer: Self-pay

## 2020-02-02 ENCOUNTER — Encounter: Payer: Self-pay | Admitting: Physical Therapy

## 2020-02-02 ENCOUNTER — Ambulatory Visit: Payer: Medicare HMO | Admitting: Occupational Therapy

## 2020-02-02 DIAGNOSIS — M6281 Muscle weakness (generalized): Secondary | ICD-10-CM | POA: Diagnosis not present

## 2020-02-02 DIAGNOSIS — R2689 Other abnormalities of gait and mobility: Secondary | ICD-10-CM

## 2020-02-02 DIAGNOSIS — R531 Weakness: Secondary | ICD-10-CM

## 2020-02-02 NOTE — Therapy (Signed)
[place] MAIN [facility] SERVICES [address] [place], [place], [address] Phone: [phone]   Fax:  [phone]  Physical Therapy Treatment  Patient Details  Name: Curtis Powell MRN: [id number] Date of Birth: Dec 12, 1965 No data recorded  Encounter Date: 02/02/2020   PT End of Session - 02/02/20 1438    Visit Number [id number]    Number of Visits 253    Date for PT Re-Evaluation 04/10/20    Authorization Type 3/10; PN 01/24/20    Authorization Time Period authorized 11/16/18-05/19/19    PT Start Time 1438    PT Stop Time 1515    PT Time Calculation (min) 37 min    Equipment Utilized During Treatment Gait belt    Activity Tolerance Patient tolerated treatment well;No increased pain    Behavior During Therapy WFL for tasks assessed/performed           Past Medical History:  Diagnosis Date  . GERD (gastroesophageal reflux disease)   . Hyperlipidemia   . Hypertension   . Obesity   . Stroke [facility])     Past Surgical History:  Procedure Laterality Date  . BRAIN SURGERY      There were no vitals filed for this visit.   Subjective Assessment - 02/02/20 1440    Subjective Patient states he is feeling good today. Denies falls or LOB since last session. No provocation of pain with exercises    Pertinent History  Patient is a pleasant 54 year old male who presents to physical therapy for weakness and immobility secondary to CVA.  Had a stroke in Feb 2018. He was previously fully independent, but this stroke caused severe residual deficits, mainly on the right side as well as speech, and now he is unable to walk or perform most of his ADLs on his own. Entire right side is very weak. He still has a little difficulty with speech but his swallowing is improved to baseline. His mom had to move in with him and now is his main caregiver.     Limitations Sitting;Lifting;Standing;Walking;House hold activities    How long can you sit comfortably? 5 minutes     How long can you stand comfortably? 5 minutes    How long can you walk comfortably? 10 ft    Patient Stated Goals Walk without walker, walk further with walker, get some strength in back     Currently in Pain? No/denies          Treatment:   Gait: Patient ambulates 36f x 2 with RW. Wheelchair follow for safety. Patient requires min verbal cues for proximity to walker throughout gait trial.     Standing with RW: Hip abduction with 3# ankle weights. 10x each leg. PT cues for upright posture throughout task.  Marches with 3# ankle weights. 10x each leg. PT cues for upright posture throughout task.  Static stand tossing ball into hoop for perturbation. CGA and chair behind for safety. Patient able to stand unsupported during task. 19 x 1 set. Posterior displacement noted with PT cues to bring hips forward. Patient able to stabilize independently after cueing.  Session limited this date due to toileting.  Pt educated throughout session about proper posture and technique with exercises. Improved exercise technique, movement at target joints, use of target muscles after min to mod verbal, visual, tactile cues.  PT Education - 02/02/20 1420    Education provided Yes    Education Details exercise technique, body mechanics, pacing, breathing    Person(s) Educated Patient    Methods Explanation;Demonstration;Verbal cues;Tactile cues    Comprehension Tactile cues required;Verbalized understanding;Returned demonstration;Verbal cues required            PT Short Term Goals - 01/24/20 1711      PT SHORT TERM GOAL #1   Title Patient will be independent in home exercise program to improve strength/mobility for better functional independence with ADLs.    Baseline hep compliant    Time 2    Period Weeks    Status Achieved      PT SHORT TERM GOAL #2   Title Patient will require min cueing for STS transfer with CGA for increased independence  with mobility.     Baseline CGA    Time 2    Period Weeks    Status Achieved      PT SHORT TERM GOAL #3   Title Patient will maintain upright posture for > 15 seconds to demonstrate strengthened postural control muscualture     Baseline 18 seconds     Time 2    Period Weeks    Status Achieved             PT Long Term Goals - 01/24/20 1711      PT LONG TERM GOAL #1   Title Patient will perform bed mobility (rolling and supine <>sit) with Mod I  to increase mobility and  perform ADLs.     Baseline 9/13: able to perform with mod I occasional Min A when very fatigued 7/14: Min A 4/21: unable to perform 6/15: improving but still min-mod A     Time 12    Period Weeks    Status Partially Met    Target Date 04/10/20      PT LONG TERM GOAL #2   Title Patient (< 5 years old) will complete five times sit to stand test in < 10 seconds indicating an increased LE strength and improved balance.    Baseline  9/13: 14 seconds with one near LOB 7/14: 14.31 seconds 6/21: 16.86 seconds 4/21: 18.8 seconds 06/02/19: 19.93 seconds one hand on RW, one hand on w/c 12/30: 16 secpmds BUE support 02/11/19: 36 seconds 5/19: 20.6s from [facility], one hand on walker and one on WC, 80% upright posture 7/1: 22 seconds one hand on walker one hand on w/c  9/14: 48 seconds with full upright posture    Time 12    Period Weeks    Status Partially Met    Target Date 04/10/20      PT LONG TERM GOAL #3   Title Patient will negotiate a bathroom with a walker and Min A for increased indep in home and external environment    Baseline  9/13: able to some in standing, requires sitting down to wash hands 7/14: unable to do     Time 12    Period Weeks    Status Partially Met    Target Date 04/10/20      PT LONG TERM GOAL #4   Title Patient will increase BLE gross strength to 4+/5 as to improve functional strength for independent gait, increased standing tolerance and increased ADL ability.    Baseline  4/21: see note 06/02/19:  hip flexion 4-/5, extension/abd 3/5 12/30: hip flexion 4/5 hip extension and abduction 3/5 02/11/19: hip flexion 4/5 hip extension and abduction  2+/54-/5 RLE, 10/15: 4/5 RLE 11/7: 4/5 RLE 3/3: 4/5 7/1:  hip flexion 4/5 hip extension 2/5 hip abduction/adduciton 2/5     Time 12    Period Weeks    Status Partially Met    Target Date 04/10/20      PT LONG TERM GOAL #6   Title Patient will ambulate 200 ft with least assistive device and Supervision with no breaks to allow for increased mobility within home.    Baseline 9/13: performed in previous session 7/14: 190 ft with RW 4/21: ambulate 150 ft 6/21: had to terminate after 90 ft due to cardiac     Time 12    Period Weeks    Status Achieved      PT LONG TERM GOAL #7   Title Patient will perform 10 MWT in <30 seconds for improved gait speed, gait mechanics, and functional capacity for mobility.     Baseline 10/11: 48s with BRW and WC follow; 9/13: terminated due to wardrobe malfunction 9/8: 42 seconds with R foot collapse 7/14: 37.98 seconds 6/21: 46.9 seconds with RW 07/28/19: 51 seconds with RW 06/02/19: 62.5 seconds with RW 11/4: 56 seconds with AFO and RW 12/30: 53 seconds with AFO and RW     Time 12    Period Weeks    Status Partially Met    Target Date 04/10/20      PT LONG TERM GOAL #8   Title Patient will increase Berg Balance score by > 6 points (38/56) to demonstrate decreased fall risk during functional activities.    Baseline 9/13: 31/56 7/14: 33/56 07/28/19: 32/56 6/21: 32/56     Time 12    Period Weeks    Status Partially Met    Target Date 04/10/20                 Plan - 02/02/20 1610    Clinical Impression Statement Patient ambulates with improved upright posture this session. He appears extremely motivated to participate in therapy. Patient requires cues for foot clearance during interventions. Patient demonstrates improved ability to self-stabilize using hip strategy. Pt would continue to benefit from skilled PT  services in order to further strengthen LE's, improve static and dynamic balance, and improve coordination in order to increase functional mobility and decrease risk of falls    Rehab Potential Fair    Clinical Impairments Affecting Rehab Potential hx of HTN, HLD, CVA, learning disability, Diabetes, brain tumor,     PT Frequency 2x / week    PT Duration 12 weeks    PT Treatment/Interventions ADLs/Self Care Home Management;Aquatic Therapy;Ultrasound;Moist Heat;Traction;DME Instruction;Gait training;Stair training;Functional mobility training;Therapeutic activities;Therapeutic exercise;Orthotic Fit/Training;Neuromuscular re-education;Balance training;Patient/family education;Manual techniques;Wheelchair mobility training;Passive range of motion;Energy conservation;Taping;Visual/perceptual remediation/compensation    PT Next Visit Plan progress standing balance    PT Home Exercise Plan TrA contraction, glute sets: needs updates. Potentially some time in supine flat in hospital bed, to improve hip extension deficits.     Consulted and Agree with Plan of Care Patient    Family Member Consulted mother           Patient will benefit from skilled therapeutic intervention in order to improve the following deficits and impairments:  Abnormal gait, Decreased activity tolerance, Decreased balance, Decreased knowledge of precautions, Decreased endurance, Decreased coordination, Decreased knowledge of use of DME, Decreased mobility, Decreased range of motion, Difficulty walking, Decreased safety awareness, Decreased strength, Impaired flexibility, Impaired perceived functional ability, Impaired tone, Postural dysfunction, Improper body mechanics, Pain  Visit Diagnosis: Muscle  weakness (generalized)  Other abnormalities of gait and mobility  Left-sided weakness     Problem List Patient Active Problem List   Diagnosis Date Noted  . Acute CVA (cerebrovascular accident) ([place]) 05/02/2016  . Left-sided  weakness 05/01/2016   Tonny Bollman, SPT This entire session was performed under direct supervision and direction of a licensed therapist/therapist assistant . I have personally read, edited and approve of the note as written. Hillis Range, PT, DPT 02/03/20 11:55 AM    02/03/2020, 11:54 AM  [place] MAIN [facility] SERVICES [address] [place], [place], [address] Phone: [phone]   Fax:  [phone]  Name: Curtis Powell MRN: [id number] Date of Birth: December 04, 1965

## 2020-02-02 NOTE — Therapy (Signed)
[place] MAIN [facility] SERVICES [address] [place], [place], [address] Phone: [phone]   Fax:  [phone]  Occupational Therapy Treatment  Patient Details  Name: Curtis Powell MRN: [id number] Date of Birth: 26-May-1965 No data recorded  Encounter Date: 02/02/2020   OT End of Session - 02/02/20 1404    Visit Number [id number]    Number of Visits 258    Date for OT Re-Evaluation 04/03/20    Authorization Type Progress reporting period starting 09/15/2019    OT Start Time 1346    OT Stop Time 1430    OT Time Calculation (min) 44 min    Activity Tolerance Patient tolerated treatment well    Behavior During Therapy [facility] for tasks assessed/performed           Past Medical History:  Diagnosis Date  . GERD (gastroesophageal reflux disease)   . Hyperlipidemia   . Hypertension   . Obesity   . Stroke [facility])     Past Surgical History:  Procedure Laterality Date  . BRAIN SURGERY      There were no vitals filed for this visit.   Subjective Assessment - 02/02/20 1403    Subjective  Pt. reports doing well today    Patient is accompanied by: Family member    Pertinent History Pt. is a 54 y.o. male who suffered a CVA on 05/01/2016. Pt. was admitted to the hospital. Once discharged, he received Home Health PT and OT services for about a month. Pt. has had multiple CVAs over the past 8 years, and has had multiple falls in the past 6 months. Pt. resides in an apartment. Pt. has caregivers for 80 hours. Pt.'s mother stays with pt. at night, and assists with IADL tasks.    Patient Stated Goals To be able to throw a ball and dribble a ball, do as much as I can for myself.     Currently in Pain? No/denies    Pain Score 0-No pain          OT TREATMENT   Therapeutic Exercise:  Pt. performed4# dowel ex. for UE strengthening secondary to weakness. Bilateral shoulder flexion, chest press,circular, and patterns were performed for 1 set 30 reps each.  Pt. worked on [place] seated for 8 min with moderate resistance.  Pt. continuesto improve BUEstrength overall. Pt. was able to tolerate increased reps with rest breaks. Pt. continues to work onBUEstrength, and functional reaching.Pt. continues tomake progress overall.Pt.continues toimprovewith LUE strength, and overall [place] skills.Pt.requires verbal, and tactile cues for there. Ex.technique.Pt. continues to present with limited BUE strength, and [facility] skills. Pt.continues to work on improving UE strength, and [facility] skills in order to work towards maximizing independence with ADLs, and IADLtasks.                        OT Education - 02/02/20 1403    Education provided Yes    Education Details grip exercises, strengthening    Person(s) Educated Patient    Methods Explanation;Demonstration    Comprehension Verbalized understanding;Returned demonstration;Verbal cues required    [name]) Educated Patient            OT Short Term Goals - 03/03/17 1459      OT SHORT TERM GOAL #1   Title `             OT Long Term Goals - 01/10/20 1316      OT LONG TERM GOAL #1  Title Pt. will UE functioning to be able to perfrom toilet hygiene with minimal assistance.      OT LONG TERM GOAL #4   Title Pt. will perform self dressing with minA and A/E as needed.    Baseline Pt. continues to be able to donn a T shirt independently with increased time. MinA donning a jacket, Mod A donning pants over his feet, and socks, minA with shoes.    Time 12    Period Weeks    Status On-going    Target Date 04/03/20      OT LONG TERM GOAL #5   Title Pt. will perform light home making tasks with minA    Baseline Pt is able to wash dishes while seated in w/c with increased accessibility of new apartment. Pt. is able to fold his clothes, his moms puts them in the and out of the washer, and dryer, and puts them onto hangers. Caregivers perform vacuuming, and dusting.    Time 12     Period Weeks      Long Term Additional Goals   Additional Long Term Goals Yes      OT LONG TERM GOAL #6   Title Pt. will write his name efficiently with 100% legibility    Baseline Pt. continues to present with 75% legibility with increased time required when printing his name, and 50% legibility when cursive writing his name to sign in when riding the [place]    Time 12    Period Weeks    Target Date 04/03/20      OT LONG TERM GOAL #7   Title Pt will independently and consistently follow HEP to increase UE strength to increase functional independence    Baseline Pt. continues to work towards independence with exercises    Time 12    Period Weeks    Target Date 04/03/20      OT LONG TERM GOAL #8   Title Pt. will require supervision ironing a shirt.    Baseline Pt. has progressed CGA, and complete set-up seated using a tabletop iron    Time 12    Period Weeks    Status On-going    Target Date 04/03/20      OT LONG TERM GOAL  #9   Baseline Pt. will independently be able to sew a button onto a shirt.    Time 12    Period Weeks    Target Date 04/03/20      OT LONG TERM GOAL  #10   TITLE Pt. will independently be able to throw a ball    Baseline Pt. continues to be limited with throwing overhand, however pt. is able to perform underhand throwing.    Time 12    Period Weeks    Status On-going    Target Date 04/03/20      OT LONG TERM GOAL  #11   TITLE Pt. will improve UE functional reaching to be able to independently use his RUE to hand his clothes in the closest.    Baseline Pt. continues to improve with reaching up with the RUE, however has difficulty reaching the hanger rack in the closet. Pt. now uses a reacher.    Time 12    Period Weeks    Status On-going      OT LONG TERM GOAL  #13   TITLE Pt. will increase BUE strength to be able to prepare for assisting with yardwork.    Baseline Pt. BUE strength 4+/5 Pt.  reports that he has been able to get out to the front.     Time 12    Period Weeks    Status On-going      OT LONG TERM GOAL  #14   TITLE Pt. will improve activity toelrance in standing to be able to complete standing ADLs, and IADLs with SBA.    Baseline Pt. is limited with ADL performance in standing.    Time 12    Period Weeks    Status New    Target Date 04/03/20                 Plan - 02/02/20 1405    Clinical Impression Statement Pt. continuesto improve BUEstrength overall. Pt. was able to tolerate increased reps with rest breaks. Pt. continues to work onBUEstrength, and functional reaching.Pt. continues tomake progress overall.Pt.continues toimprovewith LUE strength, and overall [place] skills.Pt.requires verbal, and tactile cues for there. Ex.technique.Pt. continues to present with limited BUE strength, and [facility] skills. Pt.continues to work on improving UE strength, and [facility] skills in order to work towards maximizing independence with ADLs, and IADLtasks.   Occupational performance deficits (Please refer to evaluation for details): ADL's;IADL's    Body Structure / Function / Physical Skills ADL;FMC;ROM;IADL;Pain;Coordination    Rehab Potential Fair    Clinical Decision Making Several treatment options, min-mod task modification necessary    Comorbidities Affecting Occupational Performance: May have comorbidities impacting occupational performance    Modification or Assistance to Complete Evaluation  Min-Moderate modification of tasks or assist with assess necessary to complete eval    OT Frequency 2x / week    OT Duration 12 weeks    OT Treatment/Interventions Self-care/ADL training;Patient/family education;DME and/or AE instruction;Therapeutic exercise;Neuromuscular education    OT Home Exercise Plan grip strengthening    Consulted and Agree with Plan of Care Patient           Patient will benefit from skilled therapeutic intervention in order to improve the following deficits and impairments:   Body Structure /  Function / Physical Skills: ADL, FMC, ROM, IADL, Pain, Coordination       Visit Diagnosis: Muscle weakness (generalized)    Problem List Patient Active Problem List   Diagnosis Date Noted  . Acute CVA (cerebrovascular accident) ([place]) 05/02/2016  . Left-sided weakness 05/01/2016    Harrel Carina, MS, OTR/L 02/02/2020, 2:08 PM  [place] MAIN [facility] SERVICES [address] [place], [place], [address] Phone: [phone]   Fax:  [phone]  Name: ANCHOR DWAN MRN: [id number] Date of Birth: 1965-12-02

## 2020-02-07 ENCOUNTER — Ambulatory Visit: Payer: Medicare HMO | Admitting: Occupational Therapy

## 2020-02-07 ENCOUNTER — Ambulatory Visit: Payer: Medicare HMO

## 2020-02-09 ENCOUNTER — Ambulatory Visit: Payer: Medicare HMO

## 2020-02-09 ENCOUNTER — Other Ambulatory Visit: Payer: Self-pay

## 2020-02-09 ENCOUNTER — Encounter: Payer: Self-pay | Admitting: Occupational Therapy

## 2020-02-09 ENCOUNTER — Ambulatory Visit: Payer: Medicare HMO | Attending: Family Medicine | Admitting: Occupational Therapy

## 2020-02-09 DIAGNOSIS — R2689 Other abnormalities of gait and mobility: Secondary | ICD-10-CM

## 2020-02-09 DIAGNOSIS — R278 Other lack of coordination: Secondary | ICD-10-CM | POA: Insufficient documentation

## 2020-02-09 DIAGNOSIS — R531 Weakness: Secondary | ICD-10-CM | POA: Insufficient documentation

## 2020-02-09 DIAGNOSIS — M6281 Muscle weakness (generalized): Secondary | ICD-10-CM | POA: Diagnosis not present

## 2020-02-09 NOTE — Therapy (Signed)
[place] MAIN [facility] SERVICES [address] [place], [place], [address] Phone: [phone]   Fax:  [phone]  Occupational Therapy Treatment  Patient Details  Name: Curtis Powell MRN: [id number] Date of Birth: 11-28-1965 No data recorded  Encounter Date: 02/09/2020   OT End of Session - 02/09/20 1309    Visit Number [id number]    Number of Visits 258    Date for OT Re-Evaluation 04/03/20    Authorization Type Progress reporting period starting 09/15/2019    OT Start Time 1300    OT Stop Time 1345    OT Time Calculation (min) 45 min    Activity Tolerance Patient tolerated treatment well    Behavior During Therapy [facility] for tasks assessed/performed           Past Medical History:  Diagnosis Date  . GERD (gastroesophageal reflux disease)   . Hyperlipidemia   . Hypertension   . Obesity   . Stroke [facility])     Past Surgical History:  Procedure Laterality Date  . BRAIN SURGERY      There were no vitals filed for this visit.   Subjective Assessment - 02/09/20 1308    Subjective  Pt. reports doing well today    Patient is accompanied by: Family member    Pertinent History Pt. is a 54 y.o. male who suffered a CVA on 05/01/2016. Pt. was admitted to the hospital. Once discharged, he received Home Health PT and OT services for about a month. Pt. has had multiple CVAs over the past 8 years, and has had multiple falls in the past 6 months. Pt. resides in an apartment. Pt. has caregivers for 80 hours. Pt.'s mother stays with pt. at night, and assists with IADL tasks.    Patient Stated Goals To be able to throw a ball and dribble a ball, do as much as I can for myself.     Currently in Pain? No/denies          OT TREATMENT   Therapeutic Exercise:  Pt. performed3.5# dowel ex. for UE strengthening secondary to weakness. Bilateral shoulder flexion, chest press,circular, and patterns were performedfor 1 set 30 reps each. Pt. worked on [place]  seated for 8 min with moderate resistance.  Pt. is making progress, and continuesto improveBUEstrength overall. Pt. was able to tolerate increased reps with rest breaks. Pt. continues to work onBUEstrength, andfunctional reaching.Pt. continues tomake progress overall.Pt.continues toimprovewith LUE strength, and overall [place] skills.Pt.requires verbal, and tactile cues for there. Ex.technique.Pt. continues to present with limited BUE strength, and [facility] skills. Pt.continues to work on improving UE strength, and [facility] skills in order to work towards maximizing independence with ADLs, and IADLtasks.                       OT Education - 02/09/20 1309    Education provided No    Education Details grip exercises, strengthening    Person(s) Educated Patient    Methods Explanation;Demonstration    Comprehension Verbalized understanding;Returned demonstration;Verbal cues required            OT Short Term Goals - 03/03/17 1459      OT SHORT TERM GOAL #1   Title `             OT Long Term Goals - 01/10/20 1316      OT LONG TERM GOAL #1   Title Pt. will UE functioning to be able to perfrom toilet hygiene  with minimal assistance.      OT LONG TERM GOAL #4   Title Pt. will perform self dressing with minA and A/E as needed.    Baseline Pt. continues to be able to donn a T shirt independently with increased time. MinA donning a jacket, Mod A donning pants over his feet, and socks, minA with shoes.    Time 12    Period Weeks    Status On-going    Target Date 04/03/20      OT LONG TERM GOAL #5   Title Pt. will perform light home making tasks with minA    Baseline Pt is able to wash dishes while seated in w/c with increased accessibility of new apartment. Pt. is able to fold his clothes, his moms puts them in the and out of the washer, and dryer, and puts them onto hangers. Caregivers perform vacuuming, and dusting.    Time 12    Period Weeks      Long Term  Additional Goals   Additional Long Term Goals Yes      OT LONG TERM GOAL #6   Title Pt. will write his name efficiently with 100% legibility    Baseline Pt. continues to present with 75% legibility with increased time required when printing his name, and 50% legibility when cursive writing his name to sign in when riding the [place]    Time 12    Period Weeks    Target Date 04/03/20      OT LONG TERM GOAL #7   Title Pt will independently and consistently follow HEP to increase UE strength to increase functional independence    Baseline Pt. continues to work towards independence with exercises    Time 12    Period Weeks    Target Date 04/03/20      OT LONG TERM GOAL #8   Title Pt. will require supervision ironing a shirt.    Baseline Pt. has progressed CGA, and complete set-up seated using a tabletop iron    Time 12    Period Weeks    Status On-going    Target Date 04/03/20      OT LONG TERM GOAL  #9   Baseline Pt. will independently be able to sew a button onto a shirt.    Time 12    Period Weeks    Target Date 04/03/20      OT LONG TERM GOAL  #10   TITLE Pt. will independently be able to throw a ball    Baseline Pt. continues to be limited with throwing overhand, however pt. is able to perform underhand throwing.    Time 12    Period Weeks    Status On-going    Target Date 04/03/20      OT LONG TERM GOAL  #11   TITLE Pt. will improve UE functional reaching to be able to independently use his RUE to hand his clothes in the closest.    Baseline Pt. continues to improve with reaching up with the RUE, however has difficulty reaching the hanger rack in the closet. Pt. now uses a reacher.    Time 12    Period Weeks    Status On-going      OT LONG TERM GOAL  #13   TITLE Pt. will increase BUE strength to be able to prepare for assisting with yardwork.    Baseline Pt. BUE strength 4+/5 Pt. reports that he has been able to get out to the front.  Time 12    Period Weeks     Status On-going      OT LONG TERM GOAL  #14   TITLE Pt. will improve activity toelrance in standing to be able to complete standing ADLs, and IADLs with SBA.    Baseline Pt. is limited with ADL performance in standing.    Time 12    Period Weeks    Status New    Target Date 04/03/20                 Plan - 02/09/20 1311    Clinical Impression Statement Pt. is making progress, and  continuesto improveBUEstrength overall. Pt. was able to tolerate increased reps with rest breaks. Pt. continues to work onBUEstrength, andfunctional reaching.Pt. continues tomake progress overall.Pt.continues toimprovewith LUE strength, and overall [place] skills.Pt.requires verbal, and tactile cues for there. Ex.technique.Pt. continues to present with limited BUE strength, and [facility] skills. Pt.continues to work on improving UE strength, and [facility] skills in order to work towards maximizing independence with ADLs, and IADLtasks.   OT Occupational Profile and History Problem Focused Assessment - Including review of records relating to presenting problem    Occupational performance deficits (Please refer to evaluation for details): ADL's;IADL's    Body Structure / Function / Physical Skills ADL;FMC;ROM;IADL;Pain;Coordination    Rehab Potential Fair    Clinical Decision Making Several treatment options, min-mod task modification necessary    Comorbidities Affecting Occupational Performance: May have comorbidities impacting occupational performance    Modification or Assistance to Complete Evaluation  Min-Moderate modification of tasks or assist with assess necessary to complete eval    OT Frequency 2x / week    OT Duration 12 weeks    OT Treatment/Interventions Self-care/ADL training;Patient/family education;DME and/or AE instruction;Therapeutic exercise;Neuromuscular education    Consulted and Agree with Plan of Care Patient           Patient will benefit from skilled therapeutic intervention in  order to improve the following deficits and impairments:   Body Structure / Function / Physical Skills: ADL, FMC, ROM, IADL, Pain, Coordination       Visit Diagnosis: Muscle weakness (generalized)  Other lack of coordination    Problem List Patient Active Problem List   Diagnosis Date Noted  . Acute CVA (cerebrovascular accident) ([place]) 05/02/2016  . Left-sided weakness 05/01/2016    Harrel Carina, MS, OTR/L 02/09/2020, 1:16 PM  [place] MAIN [facility] SERVICES [address] [place], [place], [address] Phone: [phone]   Fax:  [phone]  Name: Curtis Powell MRN: [id number] Date of Birth: Aug 22, 1965

## 2020-02-09 NOTE — Therapy (Signed)
[place] MAIN [facility] SERVICES [address] [place], [place], [address] Phone: [phone]   Fax:  [phone]  Physical Therapy Treatment  Patient Details  Name: Curtis Powell MRN: [id number] Date of Birth: [birth year] No data recorded  Encounter Date: 02/09/2020   PT End of Session - 02/09/20 1349    Visit Number [id number]    Number of Visits 253    Date for PT Re-Evaluation 04/10/20    Authorization Type 4/10; PN 01/24/20    Authorization Time Period authorized 11/16/18-05/19/19    PT Start Time 1350    PT Stop Time 1429    PT Time Calculation (min) 39 min    Equipment Utilized During Treatment Gait belt    Activity Tolerance Patient tolerated treatment well;No increased pain    Behavior During Therapy WFL for tasks assessed/performed           Past Medical History:  Diagnosis Date  . GERD (gastroesophageal reflux disease)   . Hyperlipidemia   . Hypertension   . Obesity   . Stroke [facility])     Past Surgical History:  Procedure Laterality Date  . BRAIN SURGERY      There were no vitals filed for this visit.   Subjective Assessment - 02/09/20 1419    Subjective Patient reports no falls or LOB since last session. Saw the doctor yesterday and had a good report per patient. Restroom break prior to session made session start slightly later than scheduled.    Pertinent History  Patient is a pleasant 54 year old male who presents to physical therapy for weakness and immobility secondary to CVA.  Had a stroke in Feb 2018. He was previously fully independent, but this stroke caused severe residual deficits, mainly on the right side as well as speech, and now he is unable to walk or perform most of his ADLs on his own. Entire right side is very weak. He still has a little difficulty with speech but his swallowing is improved to baseline. His mom had to move in with him and now is his main caregiver.     Limitations  Sitting;Lifting;Standing;Walking;House hold activities    How long can you sit comfortably? 5 minutes    How long can you stand comfortably? 5 minutes    How long can you walk comfortably? 10 ft    Patient Stated Goals Walk without walker, walk further with walker, get some strength in back     Currently in Pain? No/denies             Treatment:   Gait: Patient ambulates 160 ft with RW, CGA, and Wheelchair follow for safety. Patient requires min verbal cues for proximity to walker throughout gait trial as well as upright posture and weight shift.      Standing with RW: PVC pipe chest press 10x, two near LOB PVC pipe overhead raises, frequent near LOB, requiring occasional Min A to retain COM however does not require use of UE's to restabilize self.    Static stand tossing ball into hoop for perturbation. CGA and chair behind for safety. Patient able to stand unsupported during task. 19 x 1 set. Posterior displacement noted with PT cues to bring hips forward. Patient able to stabilize independently after cueing.   Seated: 5lb ankle weight: -LAQ  3 second holds 15x each LE -march with overhead raise of PVC pipe 10x each LE  -alternating ER/lateral stepping 10x each LE   Straight leg half arc  10x each LE Session limited this date due to toileting.   Pt educated throughout session about proper posture and technique with exercises. Improved exercise technique, movement at target joints, use of target muscles after min to mod verbal, visual, tactile cues.                         PT Education - 02/09/20 1349    Education provided Yes    Education Details exercise technique, body mechanics    Person(s) Educated Patient    Methods Explanation;Demonstration;Tactile cues;Verbal cues    Comprehension Verbalized understanding;Returned demonstration;Verbal cues required;Tactile cues required            PT Short Term Goals - 01/24/20 1711      PT SHORT TERM GOAL  #1   Title Patient will be independent in home exercise program to improve strength/mobility for better functional independence with ADLs.    Baseline hep compliant    Time 2    Period Weeks    Status Achieved      PT SHORT TERM GOAL #2   Title Patient will require min cueing for STS transfer with CGA for increased independence with mobility.     Baseline CGA    Time 2    Period Weeks    Status Achieved      PT SHORT TERM GOAL #3   Title Patient will maintain upright posture for > 15 seconds to demonstrate strengthened postural control muscualture     Baseline 18 seconds     Time 2    Period Weeks    Status Achieved             PT Long Term Goals - 01/24/20 1711      PT LONG TERM GOAL #1   Title Patient will perform bed mobility (rolling and supine <>sit) with Mod I  to increase mobility and  perform ADLs.     Baseline 9/13: able to perform with mod I occasional Min A when very fatigued 7/14: Min A 4/21: unable to perform 6/15: improving but still min-mod A     Time 12    Period Weeks    Status Partially Met    Target Date 04/10/20      PT LONG TERM GOAL #2   Title Patient (< 32 years old) will complete five times sit to stand test in < 10 seconds indicating an increased LE strength and improved balance.    Baseline  9/13: 14 seconds with one near LOB 7/14: 14.31 seconds 6/21: 16.86 seconds 4/21: 18.8 seconds 06/02/19: 19.93 seconds one hand on RW, one hand on w/c 12/30: 16 secpmds BUE support 02/11/19: 36 seconds 5/19: 20.6s from [facility], one hand on walker and one on WC, 80% upright posture 7/1: 22 seconds one hand on walker one hand on w/c  9/14: 48 seconds with full upright posture    Time 12    Period Weeks    Status Partially Met    Target Date 04/10/20      PT LONG TERM GOAL #3   Title Patient will negotiate a bathroom with a walker and Min A for increased indep in home and external environment    Baseline  9/13: able to some in standing, requires sitting down to wash  hands 7/14: unable to do     Time 12    Period Weeks    Status Partially Met    Target Date 04/10/20  PT LONG TERM GOAL #4   Title Patient will increase BLE gross strength to 4+/5 as to improve functional strength for independent gait, increased standing tolerance and increased ADL ability.    Baseline  4/21: see note 06/02/19: hip flexion 4-/5, extension/abd 3/5 12/30: hip flexion 4/5 hip extension and abduction 3/5 02/11/19: hip flexion 4/5 hip extension and abduction 2+/54-/5 RLE, 10/15: 4/5 RLE 11/7: 4/5 RLE 3/3: 4/5 7/1:  hip flexion 4/5 hip extension 2/5 hip abduction/adduciton 2/5     Time 12    Period Weeks    Status Partially Met    Target Date 04/10/20      PT LONG TERM GOAL #6   Title Patient will ambulate 200 ft with least assistive device and Supervision with no breaks to allow for increased mobility within home.    Baseline 9/13: performed in previous session 7/14: 190 ft with RW 4/21: ambulate 150 ft 6/21: had to terminate after 90 ft due to cardiac     Time 12    Period Weeks    Status Achieved      PT LONG TERM GOAL #7   Title Patient will perform 10 MWT in <30 seconds for improved gait speed, gait mechanics, and functional capacity for mobility.     Baseline 10/11: 48s with BRW and WC follow; 9/13: terminated due to wardrobe malfunction 9/8: 42 seconds with R foot collapse 7/14: 37.98 seconds 6/21: 46.9 seconds with RW 07/28/19: 51 seconds with RW 06/02/19: 62.5 seconds with RW 11/4: 56 seconds with AFO and RW 12/30: 53 seconds with AFO and RW     Time 12    Period Weeks    Status Partially Met    Target Date 04/10/20      PT LONG TERM GOAL #8   Title Patient will increase Berg Balance score by > 6 points (38/56) to demonstrate decreased fall risk during functional activities.    Baseline 9/13: 31/56 7/14: 33/56 07/28/19: 32/56 6/21: 32/56     Time 12    Period Weeks    Status Partially Met    Target Date 04/10/20                 Plan - 02/09/20 1418     Clinical Impression Statement Patient presents with excellent motivation throughout physical therapy session. He is able to return to [facility] with decreased reliance from UE's and improved core contraction. Forward trunk position is improving however does regress as patient fatigues. Pt would continue to benefit from skilled PT services in order to further strengthen LE's, improve static and dynamic balance, and improve coordination in order to increase functional mobility and decrease risk of falls    Rehab Potential Fair    Clinical Impairments Affecting Rehab Potential hx of HTN, HLD, CVA, learning disability, Diabetes, brain tumor,     PT Frequency 2x / week    PT Duration 12 weeks    PT Treatment/Interventions ADLs/Self Care Home Management;Aquatic Therapy;Ultrasound;Moist Heat;Traction;DME Instruction;Gait training;Stair training;Functional mobility training;Therapeutic activities;Therapeutic exercise;Orthotic Fit/Training;Neuromuscular re-education;Balance training;Patient/family education;Manual techniques;Wheelchair mobility training;Passive range of motion;Energy conservation;Taping;Visual/perceptual remediation/compensation    PT Next Visit Plan progress standing balance    PT Home Exercise Plan TrA contraction, glute sets: needs updates. Potentially some time in supine flat in hospital bed, to improve hip extension deficits.     Consulted and Agree with Plan of Care Patient    Family Member Consulted mother           Patient will benefit from  skilled therapeutic intervention in order to improve the following deficits and impairments:  Abnormal gait, Decreased activity tolerance, Decreased balance, Decreased knowledge of precautions, Decreased endurance, Decreased coordination, Decreased knowledge of use of DME, Decreased mobility, Decreased range of motion, Difficulty walking, Decreased safety awareness, Decreased strength, Impaired flexibility, Impaired perceived functional ability, Impaired  tone, Postural dysfunction, Improper body mechanics, Pain  Visit Diagnosis: Muscle weakness (generalized)  Other lack of coordination  Other abnormalities of gait and mobility     Problem List Patient Active Problem List   Diagnosis Date Noted  . Acute CVA (cerebrovascular accident) ([place]) 05/02/2016  . Left-sided weakness 05/01/2016   Janna Arch, PT, DPT   02/09/2020, 2:30 PM  [place] MAIN [facility] SERVICES [address] [place], [place], [address] Phone: [phone]   Fax:  [phone]  Name: Curtis Powell MRN: [id number] Date of Birth: March 16, 1966

## 2020-02-14 ENCOUNTER — Encounter: Payer: Self-pay | Admitting: Occupational Therapy

## 2020-02-14 ENCOUNTER — Ambulatory Visit: Payer: Medicare HMO | Admitting: Occupational Therapy

## 2020-02-14 ENCOUNTER — Other Ambulatory Visit: Payer: Self-pay

## 2020-02-14 ENCOUNTER — Ambulatory Visit: Payer: Medicare HMO

## 2020-02-14 DIAGNOSIS — R278 Other lack of coordination: Secondary | ICD-10-CM

## 2020-02-14 DIAGNOSIS — M6281 Muscle weakness (generalized): Secondary | ICD-10-CM

## 2020-02-14 DIAGNOSIS — R2689 Other abnormalities of gait and mobility: Secondary | ICD-10-CM

## 2020-02-14 NOTE — Therapy (Signed)
[place] MAIN [facility] SERVICES [address] [place], [place], [address] Phone: [phone]   Fax:  [phone]  Occupational Therapy Treatment  Patient Details  Name: Curtis Powell MRN: [id number] Date of Birth: May 12, 1965 No data recorded  Encounter Date: 02/14/2020   OT End of Session - 02/14/20 1324    Visit Number [id number]    Number of Visits 258    Date for OT Re-Evaluation 04/03/20    OT Start Time 1300    OT Stop Time 1345    OT Time Calculation (min) 45 min    Activity Tolerance Patient tolerated treatment well    Behavior During Therapy [facility] for tasks assessed/performed           Past Medical History:  Diagnosis Date  . GERD (gastroesophageal reflux disease)   . Hyperlipidemia   . Hypertension   . Obesity   . Stroke [facility])     Past Surgical History:  Procedure Laterality Date  . BRAIN SURGERY      There were no vitals filed for this visit.   Subjective Assessment - 02/14/20 1323    Subjective  Pt. reports thta he is doing well today.    Patient is accompanied by: Family member    Pertinent History Pt. is a 54 y.o. male who suffered a CVA on 05/01/2016. Pt. was admitted to the hospital. Once discharged, he received Home Health PT and OT services for about a month. Pt. has had multiple CVAs over the past 8 years, and has had multiple falls in the past 6 months. Pt. resides in an apartment. Pt. has caregivers for 80 hours. Pt.'s mother stays with pt. at night, and assists with IADL tasks.    Patient Stated Goals To be able to throw a ball and dribble a ball, do as much as I can for myself.     Currently in Pain? No/denies           OT TREATMENT   Therapeutic Exercise:  Pt. performed4# dowel ex.for UE strengthening secondary to weakness. Bilateral shoulder flexion, chest press,circular, andpatterns were performedfor 1 set 20 reps each.  3# dumbbell ex. for elbow flexion and extension, forearm supination/pronation,  and wrist flexion/extension 1 set 20 reps each. Pt. requires rest breaks and verbal cues for proper technique. Pt. worked on [place] seated for 8 min with moderate resistance forward, and reverse.  Pt. is making progress, and continuesto improveBUEstrengthoverall.Pt. was able to tolerate increased reps with rest breaks.Pt. continues to work onBUE strength, andfunctional reaching.Pt. continues tomake progress overall.Pt.continues toimprovewith LUE strength, and overall [place] skills.Pt.requires verbal, and tactile cues for there. Ex.technique.Pt. continues to present with limited BUE strength, and [facility] skills. Pt.continues to work on improving UE strength, and [facility] skills in order to work towards maximizing independence with ADLs, and IADLtasks.                             OT Education - 02/14/20 1324    Education provided No    Person(s) Educated Patient    Methods Explanation;Demonstration    Comprehension Verbalized understanding;Returned demonstration;Verbal cues required            OT Short Term Goals - 03/03/17 1459      OT SHORT TERM GOAL #1   Title `             OT Long Term Goals - 01/10/20 1316  OT LONG TERM GOAL #1   Title Pt. will UE functioning to be able to perfrom toilet hygiene with minimal assistance.      OT LONG TERM GOAL #4   Title Pt. will perform self dressing with minA and A/E as needed.    Baseline Pt. continues to be able to donn a T shirt independently with increased time. MinA donning a jacket, Mod A donning pants over his feet, and socks, minA with shoes.    Time 12    Period Weeks    Status On-going    Target Date 04/03/20      OT LONG TERM GOAL #5   Title Pt. will perform light home making tasks with minA    Baseline Pt is able to wash dishes while seated in w/c with increased accessibility of new apartment. Pt. is able to fold his clothes, his moms puts them in the and out of the washer, and dryer,  and puts them onto hangers. Caregivers perform vacuuming, and dusting.    Time 12    Period Weeks      Long Term Additional Goals   Additional Long Term Goals Yes      OT LONG TERM GOAL #6   Title Pt. will write his name efficiently with 100% legibility    Baseline Pt. continues to present with 75% legibility with increased time required when printing his name, and 50% legibility when cursive writing his name to sign in when riding the [place]    Time 12    Period Weeks    Target Date 04/03/20      OT LONG TERM GOAL #7   Title Pt will independently and consistently follow HEP to increase UE strength to increase functional independence    Baseline Pt. continues to work towards independence with exercises    Time 12    Period Weeks    Target Date 04/03/20      OT LONG TERM GOAL #8   Title Pt. will require supervision ironing a shirt.    Baseline Pt. has progressed CGA, and complete set-up seated using a tabletop iron    Time 12    Period Weeks    Status On-going    Target Date 04/03/20      OT LONG TERM GOAL  #9   Baseline Pt. will independently be able to sew a button onto a shirt.    Time 12    Period Weeks    Target Date 04/03/20      OT LONG TERM GOAL  #10   TITLE Pt. will independently be able to throw a ball    Baseline Pt. continues to be limited with throwing overhand, however pt. is able to perform underhand throwing.    Time 12    Period Weeks    Status On-going    Target Date 04/03/20      OT LONG TERM GOAL  #11   TITLE Pt. will improve UE functional reaching to be able to independently use his RUE to hand his clothes in the closest.    Baseline Pt. continues to improve with reaching up with the RUE, however has difficulty reaching the hanger rack in the closet. Pt. now uses a reacher.    Time 12    Period Weeks    Status On-going      OT LONG TERM GOAL  #13   TITLE Pt. will increase BUE strength to be able to prepare for assisting with yardwork.  Baseline  Pt. BUE strength 4+/5 Pt. reports that he has been able to get out to the front.    Time 12    Period Weeks    Status On-going      OT LONG TERM GOAL  #14   TITLE Pt. will improve activity toelrance in standing to be able to complete standing ADLs, and IADLs with SBA.    Baseline Pt. is limited with ADL performance in standing.    Time 12    Period Weeks    Status New    Target Date 04/03/20                 Plan - 02/14/20 1324    Clinical Impression Statement Pt. is making progress, and continuesto improveBUEstrengthoverall.Pt. was able to tolerate increased reps with rest breaks.Pt. continues to work onBUE strength, andfunctional reaching.Pt. continues tomake progress overall.Pt.continues toimprovewith LUE strength, and overall [place] skills.Pt.requires verbal, and tactile cues for there. Ex.technique.Pt. continues to present with limited BUE strength, and [facility] skills. Pt.continues to work on improving UE strength, and [facility] skills in order to work towards maximizing independence with ADLs, and IADLtasks.   OT Occupational Profile and History Problem Focused Assessment - Including review of records relating to presenting problem    Occupational performance deficits (Please refer to evaluation for details): ADL's;IADL's    Body Structure / Function / Physical Skills ADL;FMC;ROM;IADL;Pain;Coordination    Rehab Potential Fair    Clinical Decision Making Several treatment options, min-mod task modification necessary    Comorbidities Affecting Occupational Performance: May have comorbidities impacting occupational performance    Modification or Assistance to Complete Evaluation  Min-Moderate modification of tasks or assist with assess necessary to complete eval    OT Frequency 2x / week    OT Duration 12 weeks    OT Treatment/Interventions Self-care/ADL training;Patient/family education;DME and/or AE instruction;Therapeutic exercise;Neuromuscular education    Consulted  and Agree with Plan of Care Patient           Patient will benefit from skilled therapeutic intervention in order to improve the following deficits and impairments:   Body Structure / Function / Physical Skills: ADL, FMC, ROM, IADL, Pain, Coordination       Visit Diagnosis: Muscle weakness (generalized)  Other lack of coordination    Problem List Patient Active Problem List   Diagnosis Date Noted  . Acute CVA (cerebrovascular accident) ([place]) 05/02/2016  . Left-sided weakness 05/01/2016    Harrel Carina, MS, OTR/L 02/14/2020, 1:27 PM  [place] MAIN [facility] SERVICES [address] [place], [place], [address] Phone: [phone]   Fax:  [phone]  Name: Curtis Powell MRN: [id number] Date of Birth: 1965-08-20

## 2020-02-14 NOTE — Therapy (Signed)
[place] MAIN [facility] SERVICES [address] [place], [place], [address] Phone: [phone]   Fax:  [phone]  Physical Therapy Treatment  Patient Details  Name: Curtis Powell MRN: [id number] Date of Birth: Jul 30, 1965 No data recorded  Encounter Date: 02/14/2020   PT End of Session - 02/14/20 1413    Visit Number [id number]    Number of Visits 253    Date for PT Re-Evaluation 04/10/20    Authorization Type 5/10; PN 01/24/20    Authorization Time Period authorized 11/16/18-05/19/19    PT Start Time 1346    PT Stop Time 1429    PT Time Calculation (min) 43 min    Equipment Utilized During Treatment Gait belt    Activity Tolerance Patient tolerated treatment well;No increased pain    Behavior During Therapy WFL for tasks assessed/performed           Past Medical History:  Diagnosis Date  . GERD (gastroesophageal reflux disease)   . Hyperlipidemia   . Hypertension   . Obesity   . Stroke [facility])     Past Surgical History:  Procedure Laterality Date  . BRAIN SURGERY      There were no vitals filed for this visit.   Subjective Assessment - 02/14/20 1412    Subjective Patient reports he has no caregiver to walk with between PT sessions anymore. Mom will walk with him sometimes. No falls or LOB since last session.    Pertinent History  Patient is a pleasant 54 year old male who presents to physical therapy for weakness and immobility secondary to CVA.  Had a stroke in Feb 2018. He was previously fully independent, but this stroke caused severe residual deficits, mainly on the right side as well as speech, and now he is unable to walk or perform most of his ADLs on his own. Entire right side is very weak. He still has a little difficulty with speech but his swallowing is improved to baseline. His mom had to move in with him and now is his main caregiver.     Limitations Sitting;Lifting;Standing;Walking;House hold activities    How long can you sit  comfortably? 5 minutes    How long can you stand comfortably? 5 minutes    How long can you walk comfortably? 10 ft    Patient Stated Goals Walk without walker, walk further with walker, get some strength in back     Currently in Pain? No/denies               Treatment:   Gait: Patient ambulates 180 ft with RW, CGA, and Wheelchair follow for safety. Patient requires min verbal cues for proximity to walker throughout gait trial as well as upright posture and weight shift.      Standing with RW: Shadow box without UE support 45 seconds two near LOB.  Static stand 30 second hold no UE support Mini squat with BUE support cues for hip extension 10x 6" step toe taps with BUE support 8x each LE, cues for knee to sky   Seated: 5lb ankle weight: -LAQ  3 second holds 15x each LE -march with overhead raise of PVC pipe 10x each LE  -alternating ER/lateral stepping 10x each LE    Straight leg half arc 10x each LE  Pt educated throughout session about proper posture and technique with exercises. Improved exercise technique, movement at target joints, use of target muscles after min to mod verbal, visual, tactile cues.  PT Education - 02/14/20 1413    Education provided Yes    Education Details exercise technique, body mechanics,    Person(s) Educated Patient    Methods Explanation;Demonstration;Tactile cues;Verbal cues    Comprehension Verbalized understanding;Returned demonstration;Verbal cues required;Tactile cues required            PT Short Term Goals - 01/24/20 1711      PT SHORT TERM GOAL #1   Title Patient will be independent in home exercise program to improve strength/mobility for better functional independence with ADLs.    Baseline hep compliant    Time 2    Period Weeks    Status Achieved      PT SHORT TERM GOAL #2   Title Patient will require min cueing for STS transfer with CGA for increased independence with mobility.      Baseline CGA    Time 2    Period Weeks    Status Achieved      PT SHORT TERM GOAL #3   Title Patient will maintain upright posture for > 15 seconds to demonstrate strengthened postural control muscualture     Baseline 18 seconds     Time 2    Period Weeks    Status Achieved             PT Long Term Goals - 01/24/20 1711      PT LONG TERM GOAL #1   Title Patient will perform bed mobility (rolling and supine <>sit) with Mod I  to increase mobility and  perform ADLs.     Baseline 9/13: able to perform with mod I occasional Min A when very fatigued 7/14: Min A 4/21: unable to perform 6/15: improving but still min-mod A     Time 12    Period Weeks    Status Partially Met    Target Date 04/10/20      PT LONG TERM GOAL #2   Title Patient (< 93 years old) will complete five times sit to stand test in < 10 seconds indicating an increased LE strength and improved balance.    Baseline  9/13: 14 seconds with one near LOB 7/14: 14.31 seconds 6/21: 16.86 seconds 4/21: 18.8 seconds 06/02/19: 19.93 seconds one hand on RW, one hand on w/c 12/30: 16 secpmds BUE support 02/11/19: 36 seconds 5/19: 20.6s from [facility], one hand on walker and one on WC, 80% upright posture 7/1: 22 seconds one hand on walker one hand on w/c  9/14: 48 seconds with full upright posture    Time 12    Period Weeks    Status Partially Met    Target Date 04/10/20      PT LONG TERM GOAL #3   Title Patient will negotiate a bathroom with a walker and Min A for increased indep in home and external environment    Baseline  9/13: able to some in standing, requires sitting down to wash hands 7/14: unable to do     Time 12    Period Weeks    Status Partially Met    Target Date 04/10/20      PT LONG TERM GOAL #4   Title Patient will increase BLE gross strength to 4+/5 as to improve functional strength for independent gait, increased standing tolerance and increased ADL ability.    Baseline  4/21: see note 06/02/19: hip flexion 4-/5,  extension/abd 3/5 12/30: hip flexion 4/5 hip extension and abduction 3/5 02/11/19: hip flexion 4/5 hip extension and abduction 2+/54-/5 RLE,  10/15: 4/5 RLE 11/7: 4/5 RLE 3/3: 4/5 7/1:  hip flexion 4/5 hip extension 2/5 hip abduction/adduciton 2/5     Time 12    Period Weeks    Status Partially Met    Target Date 04/10/20      PT LONG TERM GOAL #6   Title Patient will ambulate 200 ft with least assistive device and Supervision with no breaks to allow for increased mobility within home.    Baseline 9/13: performed in previous session 7/14: 190 ft with RW 4/21: ambulate 150 ft 6/21: had to terminate after 90 ft due to cardiac     Time 12    Period Weeks    Status Achieved      PT LONG TERM GOAL #7   Title Patient will perform 10 MWT in <30 seconds for improved gait speed, gait mechanics, and functional capacity for mobility.     Baseline 10/11: 48s with BRW and WC follow; 9/13: terminated due to wardrobe malfunction 9/8: 42 seconds with R foot collapse 7/14: 37.98 seconds 6/21: 46.9 seconds with RW 07/28/19: 51 seconds with RW 06/02/19: 62.5 seconds with RW 11/4: 56 seconds with AFO and RW 12/30: 53 seconds with AFO and RW     Time 12    Period Weeks    Status Partially Met    Target Date 04/10/20      PT LONG TERM GOAL #8   Title Patient will increase Berg Balance score by > 6 points (38/56) to demonstrate decreased fall risk during functional activities.    Baseline 9/13: 31/56 7/14: 33/56 07/28/19: 32/56 6/21: 32/56     Time 12    Period Weeks    Status Partially Met    Target Date 04/10/20                 Plan - 02/14/20 1416    Clinical Impression Statement Patient continues to be highly motivated throughout session. Improved ability to negotiate turns and room changes.with good spatial awareness, keeping feet inside walker, and upright posture. Regression to knee hyperextension noted with prolonged ambulation due to fatigue.  Pt would continue to benefit from skilled PT services  in order to further strengthen LE's, improve static and dynamic balance, and improve coordination in order to increase functional mobility and decrease risk of falls    Rehab Potential Fair    Clinical Impairments Affecting Rehab Potential hx of HTN, HLD, CVA, learning disability, Diabetes, brain tumor,     PT Frequency 2x / week    PT Duration 12 weeks    PT Treatment/Interventions ADLs/Self Care Home Management;Aquatic Therapy;Ultrasound;Moist Heat;Traction;DME Instruction;Gait training;Stair training;Functional mobility training;Therapeutic activities;Therapeutic exercise;Orthotic Fit/Training;Neuromuscular re-education;Balance training;Patient/family education;Manual techniques;Wheelchair mobility training;Passive range of motion;Energy conservation;Taping;Visual/perceptual remediation/compensation    PT Next Visit Plan progress standing balance    PT Home Exercise Plan TrA contraction, glute sets: needs updates. Potentially some time in supine flat in hospital bed, to improve hip extension deficits.     Consulted and Agree with Plan of Care Patient    Family Member Consulted mother           Patient will benefit from skilled therapeutic intervention in order to improve the following deficits and impairments:  Abnormal gait, Decreased activity tolerance, Decreased balance, Decreased knowledge of precautions, Decreased endurance, Decreased coordination, Decreased knowledge of use of DME, Decreased mobility, Decreased range of motion, Difficulty walking, Decreased safety awareness, Decreased strength, Impaired flexibility, Impaired perceived functional ability, Impaired tone, Postural dysfunction, Improper body mechanics, Pain  Visit Diagnosis: Muscle weakness (generalized)  Other lack of coordination  Other abnormalities of gait and mobility     Problem List Patient Active Problem List   Diagnosis Date Noted  . Acute CVA (cerebrovascular accident) ([place]) 05/02/2016  . Left-sided  weakness 05/01/2016   Janna Arch, PT, DPT   02/14/2020, 2:29 PM  [place] MAIN [facility] SERVICES [address] [place], [place], [address] Phone: [phone]   Fax:  [phone]  Name: DACODA FINLAY MRN: [id number] Date of Birth: 06-22-1965

## 2020-02-16 ENCOUNTER — Encounter: Payer: Self-pay | Admitting: Occupational Therapy

## 2020-02-16 ENCOUNTER — Ambulatory Visit: Payer: Medicare HMO | Admitting: Occupational Therapy

## 2020-02-16 ENCOUNTER — Other Ambulatory Visit: Payer: Self-pay

## 2020-02-16 ENCOUNTER — Ambulatory Visit: Payer: Medicare HMO

## 2020-02-16 DIAGNOSIS — M6281 Muscle weakness (generalized): Secondary | ICD-10-CM

## 2020-02-16 DIAGNOSIS — R278 Other lack of coordination: Secondary | ICD-10-CM

## 2020-02-16 DIAGNOSIS — R2689 Other abnormalities of gait and mobility: Secondary | ICD-10-CM

## 2020-02-16 NOTE — Therapy (Signed)
[place] MAIN [facility] SERVICES [address] [place], [place], [address] Phone: [phone]   Fax:  [phone]  Physical Therapy Treatment  Patient Details  Name: Curtis Powell MRN: [id number] Date of Birth: 12-09-1965 No data recorded  Encounter Date: 02/16/2020   PT End of Session - 02/16/20 1452    Visit Number [id number]    Number of Visits 253    Date for PT Re-Evaluation 04/10/20    Authorization Type 6/10; PN 01/24/20    Authorization Time Period authorized 11/16/18-05/19/19    PT Start Time 1430    PT Stop Time 1514    PT Time Calculation (min) 44 min    Equipment Utilized During Treatment Gait belt    Activity Tolerance Patient tolerated treatment well;No increased pain    Behavior During Therapy WFL for tasks assessed/performed           Past Medical History:  Diagnosis Date  . GERD (gastroesophageal reflux disease)   . Hyperlipidemia   . Hypertension   . Obesity   . Stroke [facility])     Past Surgical History:  Procedure Laterality Date  . BRAIN SURGERY      There were no vitals filed for this visit.   Subjective Assessment - 02/16/20 1451    Subjective Patient reports no falls or LOB, reports he got a stationary pedal system at home but is heavy to move.    Pertinent History  Patient is a pleasant 54 year old male who presents to physical therapy for weakness and immobility secondary to CVA.  Had a stroke in Feb 2018. He was previously fully independent, but this stroke caused severe residual deficits, mainly on the right side as well as speech, and now he is unable to walk or perform most of his ADLs on his own. Entire right side is very weak. He still has a little difficulty with speech but his swallowing is improved to baseline. His mom had to move in with him and now is his main caregiver.     Limitations Sitting;Lifting;Standing;Walking;House hold activities    How long can you sit comfortably? 5 minutes    How long can you  stand comfortably? 5 minutes    How long can you walk comfortably? 10 ft    Patient Stated Goals Walk without walker, walk further with walker, get some strength in back     Currently in Pain? No/denies                 Treatment:   Gait: Patient ambulates 180 ft with RW, CGA, and Wheelchair follow for safety. Patient requires min verbal cues for proximity to walker throughout gait trial as well as upright posture and weight shift.      Standing in // bars: Shadow box without UE support 45 seconds two near LOB.  Mini squat with BUE support cues for hip extension 10x Forward ambulating length of // bars, then backwards ambulating full length of // bars 4x.   lateral stepping 2x length of // bars. Cues for foot clearance.     Seated: Balloon taps reaching inside/outside BOS x 4 minutes  5lb ankle weight: -LAQ  3-5 second holds 10x each LE -march 10x each LE cues for upright posture       Pt educated throughout session about proper posture and technique with exercises. Improved exercise technique, movement at target joints, use of target muscles after min to mod verbal, visual, tactile cues.  PT Education - 02/16/20 1452    Education provided Yes    Education Details exercise technique, body mechanics    Person(s) Educated Patient    Methods Explanation;Demonstration;Tactile cues;Verbal cues    Comprehension Verbalized understanding;Returned demonstration;Verbal cues required;Tactile cues required            PT Short Term Goals - 01/24/20 1711      PT SHORT TERM GOAL #1   Title Patient will be independent in home exercise program to improve strength/mobility for better functional independence with ADLs.    Baseline hep compliant    Time 2    Period Weeks    Status Achieved      PT SHORT TERM GOAL #2   Title Patient will require min cueing for STS transfer with CGA for increased independence with mobility.     Baseline  CGA    Time 2    Period Weeks    Status Achieved      PT SHORT TERM GOAL #3   Title Patient will maintain upright posture for > 15 seconds to demonstrate strengthened postural control muscualture     Baseline 18 seconds     Time 2    Period Weeks    Status Achieved             PT Long Term Goals - 01/24/20 1711      PT LONG TERM GOAL #1   Title Patient will perform bed mobility (rolling and supine <>sit) with Mod I  to increase mobility and  perform ADLs.     Baseline 9/13: able to perform with mod I occasional Min A when very fatigued 7/14: Min A 4/21: unable to perform 6/15: improving but still min-mod A     Time 12    Period Weeks    Status Partially Met    Target Date 04/10/20      PT LONG TERM GOAL #2   Title Patient (< 35 years old) will complete five times sit to stand test in < 10 seconds indicating an increased LE strength and improved balance.    Baseline  9/13: 14 seconds with one near LOB 7/14: 14.31 seconds 6/21: 16.86 seconds 4/21: 18.8 seconds 06/02/19: 19.93 seconds one hand on RW, one hand on w/c 12/30: 16 secpmds BUE support 02/11/19: 36 seconds 5/19: 20.6s from [facility], one hand on walker and one on WC, 80% upright posture 7/1: 22 seconds one hand on walker one hand on w/c  9/14: 48 seconds with full upright posture    Time 12    Period Weeks    Status Partially Met    Target Date 04/10/20      PT LONG TERM GOAL #3   Title Patient will negotiate a bathroom with a walker and Min A for increased indep in home and external environment    Baseline  9/13: able to some in standing, requires sitting down to wash hands 7/14: unable to do     Time 12    Period Weeks    Status Partially Met    Target Date 04/10/20      PT LONG TERM GOAL #4   Title Patient will increase BLE gross strength to 4+/5 as to improve functional strength for independent gait, increased standing tolerance and increased ADL ability.    Baseline  4/21: see note 06/02/19: hip flexion 4-/5,  extension/abd 3/5 12/30: hip flexion 4/5 hip extension and abduction 3/5 02/11/19: hip flexion 4/5 hip extension and abduction 2+/54-/5 RLE,  10/15: 4/5 RLE 11/7: 4/5 RLE 3/3: 4/5 7/1:  hip flexion 4/5 hip extension 2/5 hip abduction/adduciton 2/5     Time 12    Period Weeks    Status Partially Met    Target Date 04/10/20      PT LONG TERM GOAL #6   Title Patient will ambulate 200 ft with least assistive device and Supervision with no breaks to allow for increased mobility within home.    Baseline 9/13: performed in previous session 7/14: 190 ft with RW 4/21: ambulate 150 ft 6/21: had to terminate after 90 ft due to cardiac     Time 12    Period Weeks    Status Achieved      PT LONG TERM GOAL #7   Title Patient will perform 10 MWT in <30 seconds for improved gait speed, gait mechanics, and functional capacity for mobility.     Baseline 10/11: 48s with BRW and WC follow; 9/13: terminated due to wardrobe malfunction 9/8: 42 seconds with R foot collapse 7/14: 37.98 seconds 6/21: 46.9 seconds with RW 07/28/19: 51 seconds with RW 06/02/19: 62.5 seconds with RW 11/4: 56 seconds with AFO and RW 12/30: 53 seconds with AFO and RW     Time 12    Period Weeks    Status Partially Met    Target Date 04/10/20      PT LONG TERM GOAL #8   Title Patient will increase Berg Balance score by > 6 points (38/56) to demonstrate decreased fall risk during functional activities.    Baseline 9/13: 31/56 7/14: 33/56 07/28/19: 32/56 6/21: 32/56     Time 12    Period Weeks    Status Partially Met    Target Date 04/10/20                 Plan - 02/16/20 1509    Clinical Impression Statement Patient continues to progress with functional mobility and stability with decreased fatigue. Improved ability to maintain a more neutral spinal alignment noted however regression to trunk flexion with prolonged mobility noted with fatigue. Pt would continue to benefit from skilled PT services in order to further strengthen  LE's, improve static and dynamic balance, and improve coordination in order to increase functional mobility and decrease risk of falls    Rehab Potential Fair    Clinical Impairments Affecting Rehab Potential hx of HTN, HLD, CVA, learning disability, Diabetes, brain tumor,     PT Frequency 2x / week    PT Duration 12 weeks    PT Treatment/Interventions ADLs/Self Care Home Management;Aquatic Therapy;Ultrasound;Moist Heat;Traction;DME Instruction;Gait training;Stair training;Functional mobility training;Therapeutic activities;Therapeutic exercise;Orthotic Fit/Training;Neuromuscular re-education;Balance training;Patient/family education;Manual techniques;Wheelchair mobility training;Passive range of motion;Energy conservation;Taping;Visual/perceptual remediation/compensation    PT Next Visit Plan progress standing balance    PT Home Exercise Plan TrA contraction, glute sets: needs updates. Potentially some time in supine flat in hospital bed, to improve hip extension deficits.     Consulted and Agree with Plan of Care Patient    Family Member Consulted mother           Patient will benefit from skilled therapeutic intervention in order to improve the following deficits and impairments:  Abnormal gait, Decreased activity tolerance, Decreased balance, Decreased knowledge of precautions, Decreased endurance, Decreased coordination, Decreased knowledge of use of DME, Decreased mobility, Decreased range of motion, Difficulty walking, Decreased safety awareness, Decreased strength, Impaired flexibility, Impaired perceived functional ability, Impaired tone, Postural dysfunction, Improper body mechanics, Pain  Visit Diagnosis: Muscle weakness (generalized)  Other lack of coordination  Other abnormalities of gait and mobility     Problem List Patient Active Problem List   Diagnosis Date Noted  . Acute CVA (cerebrovascular accident) ([place]) 05/02/2016  . Left-sided weakness 05/01/2016   Janna Arch, PT, DPT   02/16/2020, 3:21 PM  [place] MAIN [facility] SERVICES [address] [place], [place], [address] Phone: [phone]   Fax:  [phone]  Name: Curtis Powell MRN: [id number] Date of Birth: 1965-05-20

## 2020-02-16 NOTE — Therapy (Signed)
[place] MAIN [facility] SERVICES [address] [place], [place], [address] Phone: [phone]   Fax:  [phone]  Occupational Therapy Progress Note  Dates of reporting period  01/12/2020   to   02/16/2020  Patient Details  Name: Curtis Powell MRN: [id number] Date of Birth: 08-16-1965 No data recorded  Encounter Date: 02/16/2020   OT End of Session - 02/16/20 1407    Visit Number [id number]    Number of Visits 258    Date for OT Re-Evaluation 04/03/20    Authorization Type Progress reporting period starting 01/12/2020    OT Start Time 1347    OT Stop Time 1430    OT Time Calculation (min) 43 min    Activity Tolerance Patient tolerated treatment well    Behavior During Therapy [facility] for tasks assessed/performed           Past Medical History:  Diagnosis Date  . GERD (gastroesophageal reflux disease)   . Hyperlipidemia   . Hypertension   . Obesity   . Stroke [facility])     Past Surgical History:  Procedure Laterality Date  . BRAIN SURGERY      There were no vitals filed for this visit.   Subjective Assessment - 02/16/20 1406    Subjective  Pt. reports that he is feeling good today    Patient is accompanied by: Family member    Pertinent History Pt. is a 54 y.o. male who suffered a CVA on 05/01/2016. Pt. was admitted to the hospital. Once discharged, he received Home Health PT and OT services for about a month. Pt. has had multiple CVAs over the past 8 years, and has had multiple falls in the past 6 months. Pt. resides in an apartment. Pt. has caregivers for 80 hours. Pt.'s mother stays with pt. at night, and assists with IADL tasks.    Patient Stated Goals To be able to throw a ball and dribble a ball, do as much as I can for myself.     Currently in Pain? No/denies          OT TREATMENT   Therapeutic Exercise:  Pt. performed4# dowel ex.for UE strengthening secondary to weakness. Bilateral shoulder flexion, chest press,circular,  andpatterns were performedfor 1 set 20 reps each.  3# dumbbell ex. for elbow flexion and extension, forearm supination/pronation, and wrist flexion/extension 1 set 20 reps each. Pt. requires rest breaks and verbal cues for proper technique. Pt. worked on [place] seated for 8 min with moderate resistance forward, and reverse.  Pt. is making progress, andcontinuesto improveBUEstrengthoverall.Pt. Continues to progress towards goals.Pt. continues to work onBUE strength, andfunctional reaching.Pt. continues tomake progress overall.Pt.continues toimprovewith LUE strength, and overall [place] skills.Pt.requires verbal, and tactile cues for the proper there. Ex.technique.Pt. continues to present with limited BUE strength, and [facility] skills. Pt.continues to work on improving UE strength, and [facility] skills in order to work towards maximizing independence with ADLs, and IADLtasks.                       OT Education - 02/16/20 1407    Education provided No    Person(s) Educated Patient    Methods Explanation;Demonstration    Comprehension Verbalized understanding;Returned demonstration;Verbal cues required            OT Short Term Goals - 03/03/17 1459      OT SHORT TERM GOAL #1   Title `  OT Long Term Goals - 02/16/20 1408      OT LONG TERM GOAL #1   Title Pt. will UE functioning to be able to perfrom toilet hygiene with minimal assistance.    Baseline 01/14/2018 - Pt completes toileting hygiene independently. Pt requires assistance managing LB clothing.    Time 12    Period Weeks    Status Achieved      OT LONG TERM GOAL #2   Title Pt. will complete self grooming with minA    Baseline 02/24/2018 - Pt is independent with oral care and washing his face and occasionally requires increased time if he is having a bad day. Pt's shaving is completed by his barber.    Time 12    Period Weeks    Status Achieved      OT LONG TERM GOAL #3   Title Pt.  will perform self-feeding skills with modified indepndence    Baseline 01/14/2018 - Pt is able to complete self-feeding independently with minimal spillage. Pt is unable to cut meat at this time.    Time 12    Period Weeks    Status Achieved      OT LONG TERM GOAL #4   Title Pt. will perform self dressing with minA and A/E as needed.    Baseline Pt. continues to be able to donn a T shirt independently with increased time. MinA donning a jacket, Mod A donning pants over his feet, and socks, minA with shoes.    Time 12    Period Weeks    Status On-going      OT LONG TERM GOAL #5   Title Pt. will perform light home making tasks with minA    Baseline Pt is able to wash dishes while seated in w/c with increased accessibility of new apartment. Pt. is able to fold his clothes, his moms puts them in the and out of the washer, and dryer, and puts them onto hangers. Caregivers perform vacuuming, and dusting.    Time 12    Period Weeks    Status Achieved      OT LONG TERM GOAL #6   Title Pt. will write his name efficiently with 100% legibility    Baseline Pt. continues to present with 75% legibility with increased time required when printing his name, and 50% legibility when cursive writing his name to sign in when riding the [place]    Time 12    Period Weeks    Status On-going      OT LONG TERM GOAL #7   Title Pt will independently and consistently follow HEP to increase UE strength to increase functional independence    Baseline Pt. continues to work towards independence with exercises    Time 12    Period Weeks    Status On-going      OT LONG TERM GOAL #8   Title Pt. will require supervision ironing a shirt.    Baseline Pt. has progressed CGA, and complete set-up seated using a tabletop iron    Time 12    Period Weeks    Status On-going      OT LONG TERM GOAL  #9   Baseline Pt. will independently be able to sew a button onto a shirt.    Time 12    Period Weeks    Status On-going       OT LONG TERM GOAL  #10   TITLE Pt. will independently be able to throw a ball  Baseline Pt. continues to be limited with throwing overhand, however pt. is able to perform underhand throwing.    Time 12    Period Weeks    Status On-going      OT LONG TERM GOAL  #11   TITLE Pt. will improve UE functional reaching to be able to independently use his RUE to hand his clothes in the closest.    Baseline Pt. continues to improve with reaching up with the RUE, however has difficulty reaching the hanger rack in the closet. Pt. now uses a reacher.    Time 12    Period Weeks    Status On-going      OT LONG TERM GOAL  #12   TITLE Pt. will  be independent with shaving using an electric razer.    Baseline Pt. is able to use the electric razer independently, however is unable to access the position of the plug and requires assist form caregivers secondary to the size of the bathroom, and location of the plug.    Time 12    Period Weeks    Status Achieved      OT LONG TERM GOAL  #13   TITLE Pt. will increase BUE strength to be able to prepare for assisting with yardwork.    Baseline Pt. BUE strength 4+/5 Pt. reports that he has been able to get out to the front.    Time 12    Period Weeks    Status On-going      OT LONG TERM GOAL  #14   TITLE Pt. will improve activity to[place] in standing to be able to complete standing ADLs, and IADLs with SBA.    Baseline Pt. is limited with ADL performance in standing.    Time 12    Period Weeks    Status On-going                 Plan - 02/16/20 1407    Clinical Impression Statement Pt. is making progress, andcontinuesto improveBUEstrengthoverall.Pt. Continues to progress towards goals.Pt. continues to work onBUE strength, andfunctional reaching.Pt. continues tomake progress overall.Pt.continues toimprovewith LUE strength, and overall [place] skills.Pt.requires verbal, and tactile cues for the proper there. Ex.technique.Pt.  continues to present with limited BUE strength, and [facility] skills. Pt.continues to work on improving UE strength, and [facility] skills in order to work towards maximizing independence with ADLs, and IADLtasks.   OT Occupational Profile and History Problem Focused Assessment - Including review of records relating to presenting problem    Occupational performance deficits (Please refer to evaluation for details): ADL's;IADL's    Body Structure / Function / Physical Skills ADL;FMC;ROM;IADL;Pain;Coordination    Rehab Potential Fair    Clinical Decision Making Several treatment options, min-mod task modification necessary    Comorbidities Affecting Occupational Performance: May have comorbidities impacting occupational performance    Modification or Assistance to Complete Evaluation  Min-Moderate modification of tasks or assist with assess necessary to complete eval    OT Frequency 2x / week    OT Duration 12 weeks    OT Treatment/Interventions Self-care/ADL training;Patient/family education;DME and/or AE instruction;Therapeutic exercise;Neuromuscular education    Consulted and Agree with Plan of Care Patient           Patient will benefit from skilled therapeutic intervention in order to improve the following deficits and impairments:   Body Structure / Function / Physical Skills: ADL, FMC, ROM, IADL, Pain, Coordination       Visit Diagnosis: Muscle weakness (generalized)  Problem List Patient Active Problem List   Diagnosis Date Noted  . Acute CVA (cerebrovascular accident) ([place]) 05/02/2016  . Left-sided weakness 05/01/2016    Harrel Carina, MS, OTR/L 02/16/2020, 2:12 PM  [place] MAIN [facility] SERVICES [address] [place], [place], [address] Phone: [phone]   Fax:  [phone]  Name: Curtis Powell MRN: [id number] Date of Birth: 14-Aug-1965

## 2020-02-21 ENCOUNTER — Ambulatory Visit: Payer: Medicare HMO

## 2020-02-21 ENCOUNTER — Other Ambulatory Visit: Payer: Self-pay

## 2020-02-21 ENCOUNTER — Encounter: Payer: Self-pay | Admitting: Occupational Therapy

## 2020-02-21 ENCOUNTER — Ambulatory Visit: Payer: Medicare HMO | Admitting: Occupational Therapy

## 2020-02-21 DIAGNOSIS — M6281 Muscle weakness (generalized): Secondary | ICD-10-CM

## 2020-02-21 DIAGNOSIS — R278 Other lack of coordination: Secondary | ICD-10-CM

## 2020-02-21 DIAGNOSIS — R2689 Other abnormalities of gait and mobility: Secondary | ICD-10-CM

## 2020-02-21 DIAGNOSIS — R531 Weakness: Secondary | ICD-10-CM

## 2020-02-21 NOTE — Therapy (Signed)
[place] MAIN [facility] SERVICES [address] [place], [place], [address] Phone: [phone]   Fax:  [phone]  Physical Therapy Treatment  Patient Details  Name: Curtis Powell MRN: [id number] Date of Birth: 1965-07-28 No data recorded  Encounter Date: 02/21/2020   PT End of Session - 02/21/20 1334    Visit Number 247    Number of Visits 253    Date for PT Re-Evaluation 04/10/20    Authorization Type 7/10; PN 01/24/20    Authorization Time Period authorized 11/16/18-05/19/19    PT Start Time 2449    PT Stop Time 1430    PT Time Calculation (min) 43 min    Equipment Utilized During Treatment Gait belt    Activity Tolerance Patient tolerated treatment well;No increased pain    Behavior During Therapy WFL for tasks assessed/performed           Past Medical History:  Diagnosis Date  . GERD (gastroesophageal reflux disease)   . Hyperlipidemia   . Hypertension   . Obesity   . Stroke [facility])     Past Surgical History:  Procedure Laterality Date  . BRAIN SURGERY      There were no vitals filed for this visit.   Subjective Assessment - 02/21/20 1359    Subjective Patient reports his bed's remote control was broken, had to sleep on the couch. No falls or LOB since last session.    Pertinent History  Patient is a pleasant 54 year old male who presents to physical therapy for weakness and immobility secondary to CVA.  Had a stroke in Feb 2018. He was previously fully independent, but this stroke caused severe residual deficits, mainly on the right side as well as speech, and now he is unable to walk or perform most of his ADLs on his own. Entire right side is very weak. He still has a little difficulty with speech but his swallowing is improved to baseline. His mom had to move in with him and now is his main caregiver.     Limitations Sitting;Lifting;Standing;Walking;House hold activities    How long can you sit comfortably? 5 minutes    How  long can you stand comfortably? 5 minutes    How long can you walk comfortably? 10 ft    Patient Stated Goals Walk without walker, walk further with walker, get some strength in back     Currently in Pain? No/denies                    Treatment:   Gait: Patient ambulates 110 ft with RW, CGA, and Wheelchair follow for safety. Patient requires min verbal cues for proximity to walker throughout gait trial as well as upright posture and weight shift.      Standing with RW Cross body punch/ box without UE support 75 seconds two near LOB. x4 trials Mini squat with BUE support cues for hip extension 10x Standing soccer ball kicks 8x each LE, min A for stabilizations  Ambulate 20 ft , negotiate turning a cone, return to chair.    Seated: Balloon taps reaching inside/outside BOS x 4 minutes  soccer ball kicks for coordination, sequencing, timing of muscle recruitment x 4 minutes. More challenging for RLE than LLE       Pt educated throughout session about proper posture and technique with exercises. Improved exercise technique, movement at target joints, use of target muscles after min to mod verbal, visual, tactile cues.  PT Education - 02/21/20 1333    Education provided Yes    Education Details exercise technique, body mechanics, mobility    Person(s) Educated Patient    Methods Explanation;Demonstration;Tactile cues;Verbal cues    Comprehension Verbalized understanding;Returned demonstration;Verbal cues required;Tactile cues required            PT Short Term Goals - 01/24/20 1711      PT SHORT TERM GOAL #1   Title Patient will be independent in home exercise program to improve strength/mobility for better functional independence with ADLs.    Baseline hep compliant    Time 2    Period Weeks    Status Achieved      PT SHORT TERM GOAL #2   Title Patient will require min cueing for STS transfer with CGA for increased independence  with mobility.     Baseline CGA    Time 2    Period Weeks    Status Achieved      PT SHORT TERM GOAL #3   Title Patient will maintain upright posture for > 15 seconds to demonstrate strengthened postural control muscualture     Baseline 18 seconds     Time 2    Period Weeks    Status Achieved             PT Long Term Goals - 01/24/20 1711      PT LONG TERM GOAL #1   Title Patient will perform bed mobility (rolling and supine <>sit) with Mod I  to increase mobility and  perform ADLs.     Baseline 9/13: able to perform with mod I occasional Min A when very fatigued 7/14: Min A 4/21: unable to perform 6/15: improving but still min-mod A     Time 12    Period Weeks    Status Partially Met    Target Date 04/10/20      PT LONG TERM GOAL #2   Title Patient (< 1 years old) will complete five times sit to stand test in < 10 seconds indicating an increased LE strength and improved balance.    Baseline  9/13: 14 seconds with one near LOB 7/14: 14.31 seconds 6/21: 16.86 seconds 4/21: 18.8 seconds 06/02/19: 19.93 seconds one hand on RW, one hand on w/c 12/30: 16 secpmds BUE support 02/11/19: 36 seconds 5/19: 20.6s from [facility], one hand on walker and one on WC, 80% upright posture 7/1: 22 seconds one hand on walker one hand on w/c  9/14: 48 seconds with full upright posture    Time 12    Period Weeks    Status Partially Met    Target Date 04/10/20      PT LONG TERM GOAL #3   Title Patient will negotiate a bathroom with a walker and Min A for increased indep in home and external environment    Baseline  9/13: able to some in standing, requires sitting down to wash hands 7/14: unable to do     Time 12    Period Weeks    Status Partially Met    Target Date 04/10/20      PT LONG TERM GOAL #4   Title Patient will increase BLE gross strength to 4+/5 as to improve functional strength for independent gait, increased standing tolerance and increased ADL ability.    Baseline  4/21: see note 06/02/19:  hip flexion 4-/5, extension/abd 3/5 12/30: hip flexion 4/5 hip extension and abduction 3/5 02/11/19: hip flexion 4/5 hip extension and abduction 2+/54-/5  RLE, 10/15: 4/5 RLE 11/7: 4/5 RLE 3/3: 4/5 7/1:  hip flexion 4/5 hip extension 2/5 hip abduction/adduciton 2/5     Time 12    Period Weeks    Status Partially Met    Target Date 04/10/20      PT LONG TERM GOAL #6   Title Patient will ambulate 200 ft with least assistive device and Supervision with no breaks to allow for increased mobility within home.    Baseline 9/13: performed in previous session 7/14: 190 ft with RW 4/21: ambulate 150 ft 6/21: had to terminate after 90 ft due to cardiac     Time 12    Period Weeks    Status Achieved      PT LONG TERM GOAL #7   Title Patient will perform 10 MWT in <30 seconds for improved gait speed, gait mechanics, and functional capacity for mobility.     Baseline 10/11: 48s with BRW and WC follow; 9/13: terminated due to wardrobe malfunction 9/8: 42 seconds with R foot collapse 7/14: 37.98 seconds 6/21: 46.9 seconds with RW 07/28/19: 51 seconds with RW 06/02/19: 62.5 seconds with RW 11/4: 56 seconds with AFO and RW 12/30: 53 seconds with AFO and RW     Time 12    Period Weeks    Status Partially Met    Target Date 04/10/20      PT LONG TERM GOAL #8   Title Patient will increase Berg Balance score by > 6 points (38/56) to demonstrate decreased fall risk during functional activities.    Baseline 9/13: 31/56 7/14: 33/56 07/28/19: 32/56 6/21: 32/56     Time 12    Period Weeks    Status Partially Met    Target Date 04/10/20                 Plan - 02/21/20 1522    Clinical Impression Statement Patient is progressing with standing tolerance with decreased need for support. He is able to perform cross body punches without support, and is able to internalize cueing for returning to BOS by end of session. Standing soccer ball kicks tolerated for first time this session with RW and close CGA. Pt would  continue to benefit from skilled PT services in order to further strengthen LE's, improve static and dynamic balance, and improve coordination in order to increase functional mobility and decrease risk of falls    Rehab Potential Fair    Clinical Impairments Affecting Rehab Potential hx of HTN, HLD, CVA, learning disability, Diabetes, brain tumor,     PT Frequency 2x / week    PT Duration 12 weeks    PT Treatment/Interventions ADLs/Self Care Home Management;Aquatic Therapy;Ultrasound;Moist Heat;Traction;DME Instruction;Gait training;Stair training;Functional mobility training;Therapeutic activities;Therapeutic exercise;Orthotic Fit/Training;Neuromuscular re-education;Balance training;Patient/family education;Manual techniques;Wheelchair mobility training;Passive range of motion;Energy conservation;Taping;Visual/perceptual remediation/compensation    PT Next Visit Plan progress standing balance    PT Home Exercise Plan TrA contraction, glute sets: needs updates. Potentially some time in supine flat in hospital bed, to improve hip extension deficits.     Consulted and Agree with Plan of Care Patient    Family Member Consulted mother           Patient will benefit from skilled therapeutic intervention in order to improve the following deficits and impairments:  Abnormal gait, Decreased activity tolerance, Decreased balance, Decreased knowledge of precautions, Decreased endurance, Decreased coordination, Decreased knowledge of use of DME, Decreased mobility, Decreased range of motion, Difficulty walking, Decreased safety awareness, Decreased strength, Impaired flexibility,  Impaired perceived functional ability, Impaired tone, Postural dysfunction, Improper body mechanics, Pain  Visit Diagnosis: Muscle weakness (generalized)  Other lack of coordination  Other abnormalities of gait and mobility  Left-sided weakness     Problem List Patient Active Problem List   Diagnosis Date Noted  . Acute  CVA (cerebrovascular accident) ([place]) 05/02/2016  . Left-sided weakness 05/01/2016   Janna Arch, PT, DPT   02/21/2020, 3:23 PM  [place] MAIN [facility] SERVICES [address] [place], [place], [address] Phone: [phone]   Fax:  [phone]  Name: Curtis Powell MRN: [id number] Date of Birth: January 20, 1966

## 2020-02-21 NOTE — Therapy (Signed)
[place] MAIN [facility] SERVICES [address] [place], [place], [address] Phone: [phone]   Fax:  [phone]  Occupational Therapy Treatment  Patient Details  Name: Curtis Powell MRN: [id number] Date of Birth: 11-29-1965 No data recorded  Encounter Date: 02/21/2020   OT End of Session - 02/21/20 1324    Visit Number 231    Number of Visits 258    Date for OT Re-Evaluation 04/03/20    OT Start Time 1300    OT Stop Time 1345    OT Time Calculation (min) 45 min    Activity Tolerance Patient tolerated treatment well    Behavior During Therapy [facility] for tasks assessed/performed           Past Medical History:  Diagnosis Date  . GERD (gastroesophageal reflux disease)   . Hyperlipidemia   . Hypertension   . Obesity   . Stroke [facility])     Past Surgical History:  Procedure Laterality Date  . BRAIN SURGERY      There were no vitals filed for this visit.   Subjective Assessment - 02/21/20 1321    Subjective  Pt. reports that he is feeling good today    Patient is accompanied by: Family member    Pertinent History Pt. is a 54 y.o. male who suffered a CVA on 05/01/2016. Pt. was admitted to the hospital. Once discharged, he received Home Health PT and OT services for about a month. Pt. has had multiple CVAs over the past 8 years, and has had multiple falls in the past 6 months. Pt. resides in an apartment. Pt. has caregivers for 80 hours. Pt.'s mother stays with pt. at night, and assists with IADL tasks.    Patient Stated Goals To be able to throw a ball and dribble a ball, do as much as I can for myself.     Currently in Pain? No/denies           OT TREATMENT    Neuro muscular re-education:  Pt. worked on [facility] skills grasping 1" sticks, 1/4" collars, and 1/4" washers. Pt. worked on storing the objects in the palm, and translatory skills moving the items from the palm of the hand to the tip of the 2nd digit, and thumb. Pt. worked on  removing the pegs using bilateral alternating hand patterns.   Therapeutic Exercise:   Pt. worked on the [place] in forward motion with minimal resistance for 8 min. for strengthening, and ROM.  Pt. Reports having a bad weekend because his hospital bed broke. Pt. Reports that he has been sleeping in a recliner. Pt. tolerated the BUE exercises well. Pt. Dropped several items when performing translatory movements with his right hand. Pt. continues to work on improving RUE strength, and [facility] skills in order to improve, and maximize independence with ADLs, and IADL tasks.                      OT Education - 02/21/20 1323    Education provided No    Person(s) Educated Patient    Methods Explanation;Demonstration    Comprehension Verbalized understanding;Returned demonstration;Verbal cues required            OT Short Term Goals - 03/03/17 1459      OT SHORT TERM GOAL #1   Title `             OT Long Term Goals - 02/16/20 1408      OT  LONG TERM GOAL #1   Title Pt. will UE functioning to be able to perfrom toilet hygiene with minimal assistance.    Baseline 01/14/2018 - Pt completes toileting hygiene independently. Pt requires assistance managing LB clothing.    Time 12    Period Weeks    Status Achieved      OT LONG TERM GOAL #2   Title Pt. will complete self grooming with minA    Baseline 02/24/2018 - Pt is independent with oral care and washing his face and occasionally requires increased time if he is having a bad day. Pt's shaving is completed by his barber.    Time 12    Period Weeks    Status Achieved      OT LONG TERM GOAL #3   Title Pt. will perform self-feeding skills with modified indepndence    Baseline 01/14/2018 - Pt is able to complete self-feeding independently with minimal spillage. Pt is unable to cut meat at this time.    Time 12    Period Weeks    Status Achieved      OT LONG TERM GOAL #4   Title Pt. will perform self dressing with  minA and A/E as needed.    Baseline Pt. continues to be able to donn a T shirt independently with increased time. MinA donning a jacket, Mod A donning pants over his feet, and socks, minA with shoes.    Time 12    Period Weeks    Status On-going      OT LONG TERM GOAL #5   Title Pt. will perform light home making tasks with minA    Baseline Pt is able to wash dishes while seated in w/c with increased accessibility of new apartment. Pt. is able to fold his clothes, his moms puts them in the and out of the washer, and dryer, and puts them onto hangers. Caregivers perform vacuuming, and dusting.    Time 12    Period Weeks    Status Achieved      OT LONG TERM GOAL #6   Title Pt. will write his name efficiently with 100% legibility    Baseline Pt. continues to present with 75% legibility with increased time required when printing his name, and 50% legibility when cursive writing his name to sign in when riding the [place]    Time 12    Period Weeks    Status On-going      OT LONG TERM GOAL #7   Title Pt will independently and consistently follow HEP to increase UE strength to increase functional independence    Baseline Pt. continues to work towards independence with exercises    Time 12    Period Weeks    Status On-going      OT LONG TERM GOAL #8   Title Pt. will require supervision ironing a shirt.    Baseline Pt. has progressed CGA, and complete set-up seated using a tabletop iron    Time 12    Period Weeks    Status On-going      OT LONG TERM GOAL  #9   Baseline Pt. will independently be able to sew a button onto a shirt.    Time 12    Period Weeks    Status On-going      OT LONG TERM GOAL  #10   TITLE Pt. will independently be able to throw a ball    Baseline Pt. continues to be limited with throwing overhand, however pt.  is able to perform underhand throwing.    Time 12    Period Weeks    Status On-going      OT LONG TERM GOAL  #11   TITLE Pt. will improve UE functional  reaching to be able to independently use his RUE to hand his clothes in the closest.    Baseline Pt. continues to improve with reaching up with the RUE, however has difficulty reaching the hanger rack in the closet. Pt. now uses a reacher.    Time 12    Period Weeks    Status On-going      OT LONG TERM GOAL  #12   TITLE Pt. will  be independent with shaving using an electric razer.    Baseline Pt. is able to use the electric razer independently, however is unable to access the position of the plug and requires assist form caregivers secondary to the size of the bathroom, and location of the plug.    Time 12    Period Weeks    Status Achieved      OT LONG TERM GOAL  #13   TITLE Pt. will increase BUE strength to be able to prepare for assisting with yardwork.    Baseline Pt. BUE strength 4+/5 Pt. reports that he has been able to get out to the front.    Time 12    Period Weeks    Status On-going      OT LONG TERM GOAL  #14   TITLE Pt. will improve activity [place] in standing to be able to complete standing ADLs, and IADLs with SBA.    Baseline Pt. is limited with ADL performance in standing.    Time 12    Period Weeks    Status On-going                 Plan - 02/21/20 1325    Clinical Impression Statement Pt. Reports having a bad weekend because his hospital bed broke. Pt. Reports that he has been sleeping in a recliner. Pt. tolerated the BUE exercises well. Pt. Dropped several items when performing translatory movements with his right hand. Pt. continues to work on improving RUE strength, and [facility] skills in order to improve, and maximize independence with ADLs, and IADL tasks.    OT Occupational Profile and History Problem Focused Assessment - Including review of records relating to presenting problem    Occupational performance deficits (Please refer to evaluation for details): ADL's;IADL's    Body Structure / Function / Physical Skills ADL;FMC;ROM;IADL;Pain;Coordination     Rehab Potential Fair    Clinical Decision Making Several treatment options, min-mod task modification necessary    Comorbidities Affecting Occupational Performance: May have comorbidities impacting occupational performance    Modification or Assistance to Complete Evaluation  Min-Moderate modification of tasks or assist with assess necessary to complete eval    OT Frequency 2x / week    OT Duration 12 weeks    OT Treatment/Interventions Self-care/ADL training;Patient/family education;DME and/or AE instruction;Therapeutic exercise;Neuromuscular education    Consulted and Agree with Plan of Care Patient           Patient will benefit from skilled therapeutic intervention in order to improve the following deficits and impairments:   Body Structure / Function / Physical Skills: ADL, FMC, ROM, IADL, Pain, Coordination       Visit Diagnosis: Muscle weakness (generalized)  Other lack of coordination    Problem List Patient Active Problem List   Diagnosis Date  Noted  . Acute CVA (cerebrovascular accident) ([place]) 05/02/2016  . Left-sided weakness 05/01/2016    Harrel Carina, MS, OTR/L 02/21/2020, 1:31 PM  [place] MAIN [facility] SERVICES [address] [place], [place], [address] Phone: [phone]   Fax:  [phone]  Name: Curtis Powell MRN: [id number] Date of Birth: 1965/10/12

## 2020-02-23 ENCOUNTER — Ambulatory Visit: Payer: Medicare HMO | Admitting: Occupational Therapy

## 2020-02-23 ENCOUNTER — Other Ambulatory Visit: Payer: Self-pay

## 2020-02-23 ENCOUNTER — Ambulatory Visit: Payer: Medicare HMO

## 2020-02-23 ENCOUNTER — Encounter: Payer: Self-pay | Admitting: Occupational Therapy

## 2020-02-23 DIAGNOSIS — R278 Other lack of coordination: Secondary | ICD-10-CM

## 2020-02-23 DIAGNOSIS — R2689 Other abnormalities of gait and mobility: Secondary | ICD-10-CM

## 2020-02-23 DIAGNOSIS — M6281 Muscle weakness (generalized): Secondary | ICD-10-CM | POA: Diagnosis not present

## 2020-02-23 DIAGNOSIS — R531 Weakness: Secondary | ICD-10-CM

## 2020-02-23 NOTE — Therapy (Signed)
[place] MAIN [facility] SERVICES [address] [place], [place], [address] Phone: [phone]   Fax:  [phone]  Physical Therapy Treatment  Patient Details  Name: Curtis Powell MRN: [id number] Date of Birth: 26-Jan-1966 No data recorded  Encounter Date: 02/23/2020   PT End of Session - 02/23/20 1408    Visit Number [id number]    Number of Visits 253    Date for PT Re-Evaluation 04/10/20    Authorization Type 8/10; PN 01/24/20    Authorization Time Period authorized 11/16/18-05/19/19    PT Start Time 1346    PT Stop Time 1429    PT Time Calculation (min) 43 min    Equipment Utilized During Treatment Gait belt    Activity Tolerance Patient tolerated treatment well;No increased pain    Behavior During Therapy WFL for tasks assessed/performed           Past Medical History:  Diagnosis Date  . GERD (gastroesophageal reflux disease)   . Hyperlipidemia   . Hypertension   . Obesity   . Stroke [facility])     Past Surgical History:  Procedure Laterality Date  . BRAIN SURGERY      There were no vitals filed for this visit.   Subjective Assessment - 02/23/20 1404    Subjective Patient reports his bed still isn't fixed, having a hard time sleeping on the couch. No falls or LOB since last session.    Pertinent History  Patient is a pleasant 54 year old male who presents to physical therapy for weakness and immobility secondary to CVA.  Had a stroke in Feb 2018. He was previously fully independent, but this stroke caused severe residual deficits, mainly on the right side as well as speech, and now he is unable to walk or perform most of his ADLs on his own. Entire right side is very weak. He still has a little difficulty with speech but his swallowing is improved to baseline. His mom had to move in with him and now is his main caregiver.     Limitations Sitting;Lifting;Standing;Walking;House hold activities    How long can you sit comfortably? 5 minutes     How long can you stand comfortably? 5 minutes    How long can you walk comfortably? 10 ft    Patient Stated Goals Walk without walker, walk further with walker, get some strength in back     Currently in Pain? No/denies               Treatment:   Gait: Patient ambulates 120 ft with RW, CGA, and Wheelchair follow for safety. Patient requires min verbal cues for proximity to walker throughout gait trial as well as upright posture and weight shift.      Standing in // bars Forward/backwards walking 4x length of // bars ; cues for slow big steps  8" step toe taps ; cues for upright posture, hip flexion; 8x each LE, very challenging  Side stepping in // bars 2x length of // bars; close CGA Static stand on airex pad: 60 seconds    Seated: GTB hamstring curl 15x each LE against PT resistance   sit to stand 10x with decreasing UE support      Pt educated throughout session about proper posture and technique with exercises. Improved exercise technique, movement at target joints, use of target muscles after min to mod verbal, visual, tactile cues.  PT Education - 02/23/20 1408    Education provided Yes    Education Details exercise technique, body mechanics    Person(s) Educated Patient    Methods Explanation;Demonstration;Tactile cues;Verbal cues    Comprehension Verbalized understanding;Returned demonstration;Verbal cues required;Tactile cues required            PT Short Term Goals - 01/24/20 1711      PT SHORT TERM GOAL #1   Title Patient will be independent in home exercise program to improve strength/mobility for better functional independence with ADLs.    Baseline hep compliant    Time 2    Period Weeks    Status Achieved      PT SHORT TERM GOAL #2   Title Patient will require min cueing for STS transfer with CGA for increased independence with mobility.     Baseline CGA    Time 2    Period Weeks    Status Achieved      PT  SHORT TERM GOAL #3   Title Patient will maintain upright posture for > 15 seconds to demonstrate strengthened postural control muscualture     Baseline 18 seconds     Time 2    Period Weeks    Status Achieved             PT Long Term Goals - 01/24/20 1711      PT LONG TERM GOAL #1   Title Patient will perform bed mobility (rolling and supine <>sit) with Mod I  to increase mobility and  perform ADLs.     Baseline 9/13: able to perform with mod I occasional Min A when very fatigued 7/14: Min A 4/21: unable to perform 6/15: improving but still min-mod A     Time 12    Period Weeks    Status Partially Met    Target Date 04/10/20      PT LONG TERM GOAL #2   Title Patient (< 42 years old) will complete five times sit to stand test in < 10 seconds indicating an increased LE strength and improved balance.    Baseline  9/13: 14 seconds with one near LOB 7/14: 14.31 seconds 6/21: 16.86 seconds 4/21: 18.8 seconds 06/02/19: 19.93 seconds one hand on RW, one hand on w/c 12/30: 16 secpmds BUE support 02/11/19: 36 seconds 5/19: 20.6s from [facility], one hand on walker and one on WC, 80% upright posture 7/1: 22 seconds one hand on walker one hand on w/c  9/14: 48 seconds with full upright posture    Time 12    Period Weeks    Status Partially Met    Target Date 04/10/20      PT LONG TERM GOAL #3   Title Patient will negotiate a bathroom with a walker and Min A for increased indep in home and external environment    Baseline  9/13: able to some in standing, requires sitting down to wash hands 7/14: unable to do     Time 12    Period Weeks    Status Partially Met    Target Date 04/10/20      PT LONG TERM GOAL #4   Title Patient will increase BLE gross strength to 4+/5 as to improve functional strength for independent gait, increased standing tolerance and increased ADL ability.    Baseline  4/21: see note 06/02/19: hip flexion 4-/5, extension/abd 3/5 12/30: hip flexion 4/5 hip extension and abduction 3/5  02/11/19: hip flexion 4/5 hip extension and abduction 2+/54-/5 RLE,  10/15: 4/5 RLE 11/7: 4/5 RLE 3/3: 4/5 7/1:  hip flexion 4/5 hip extension 2/5 hip abduction/adduciton 2/5     Time 12    Period Weeks    Status Partially Met    Target Date 04/10/20      PT LONG TERM GOAL #6   Title Patient will ambulate 200 ft with least assistive device and Supervision with no breaks to allow for increased mobility within home.    Baseline 9/13: performed in previous session 7/14: 190 ft with RW 4/21: ambulate 150 ft 6/21: had to terminate after 90 ft due to cardiac     Time 12    Period Weeks    Status Achieved      PT LONG TERM GOAL #7   Title Patient will perform 10 MWT in <30 seconds for improved gait speed, gait mechanics, and functional capacity for mobility.     Baseline 10/11: 48s with BRW and WC follow; 9/13: terminated due to wardrobe malfunction 9/8: 42 seconds with R foot collapse 7/14: 37.98 seconds 6/21: 46.9 seconds with RW 07/28/19: 51 seconds with RW 06/02/19: 62.5 seconds with RW 11/4: 56 seconds with AFO and RW 12/30: 53 seconds with AFO and RW     Time 12    Period Weeks    Status Partially Met    Target Date 04/10/20      PT LONG TERM GOAL #8   Title Patient will increase Berg Balance score by > 6 points (38/56) to demonstrate decreased fall risk during functional activities.    Baseline 9/13: 31/56 7/14: 33/56 07/28/19: 32/56 6/21: 32/56     Time 12    Period Weeks    Status Partially Met    Target Date 04/10/20                 Plan - 02/23/20 1533    Clinical Impression Statement Patient tolerated increased standing interventions this session allowing for intervention progression in // bars. Patient is improving hip flexion and foot clearance with visual/tactile cue. Improved ability to maintain a more neutral spinal alignment noted however regression to trunk flexion with prolonged mobility noted with fatigue. Pt would continue to benefit from skilled PT services in order  to further strengthen LE's, improve static and dynamic balance, and improve coordination in order to increase functional mobility and decrease risk of falls    Rehab Potential Fair    Clinical Impairments Affecting Rehab Potential hx of HTN, HLD, CVA, learning disability, Diabetes, brain tumor,     PT Frequency 2x / week    PT Duration 12 weeks    PT Treatment/Interventions ADLs/Self Care Home Management;Aquatic Therapy;Ultrasound;Moist Heat;Traction;DME Instruction;Gait training;Stair training;Functional mobility training;Therapeutic activities;Therapeutic exercise;Orthotic Fit/Training;Neuromuscular re-education;Balance training;Patient/family education;Manual techniques;Wheelchair mobility training;Passive range of motion;Energy conservation;Taping;Visual/perceptual remediation/compensation    PT Next Visit Plan progress standing balance    PT Home Exercise Plan TrA contraction, glute sets: needs updates. Potentially some time in supine flat in hospital bed, to improve hip extension deficits.     Consulted and Agree with Plan of Care Patient    Family Member Consulted mother           Patient will benefit from skilled therapeutic intervention in order to improve the following deficits and impairments:  Abnormal gait, Decreased activity tolerance, Decreased balance, Decreased knowledge of precautions, Decreased endurance, Decreased coordination, Decreased knowledge of use of DME, Decreased mobility, Decreased range of motion, Difficulty walking, Decreased safety awareness, Decreased strength, Impaired flexibility, Impaired perceived functional ability, Impaired  tone, Postural dysfunction, Improper body mechanics, Pain  Visit Diagnosis: Muscle weakness (generalized)  Other lack of coordination  Other abnormalities of gait and mobility  Left-sided weakness     Problem List Patient Active Problem List   Diagnosis Date Noted  . Acute CVA (cerebrovascular accident) ([place]) 05/02/2016  .  Left-sided weakness 05/01/2016   Janna Arch, PT, DPT   02/23/2020, 3:34 PM  [place] MAIN [facility] SERVICES [address] [place], [place], [address] Phone: [phone]   Fax:  [phone]  Name: Curtis Powell MRN: [id number] Date of Birth: Aug 07, 1965

## 2020-02-23 NOTE — Therapy (Signed)
[place] MAIN [facility] SERVICES [address] [place], [place], [address] Phone: [phone]   Fax:  [phone]  Occupational Therapy Treatment  Patient Details  Name: Curtis Powell MRN: [id number] Date of Birth: October 22, 1965 No data recorded  Encounter Date: 02/23/2020   OT End of Session - 02/23/20 1321    Visit Number [id number]    Number of Visits 258    Date for OT Re-Evaluation 04/03/20    Authorization Type Progress reporting period starting 09/15/2019    OT Start Time 1300    OT Stop Time 1345    OT Time Calculation (min) 45 min    Activity Tolerance Patient tolerated treatment well    Behavior During Therapy [facility] for tasks assessed/performed           Past Medical History:  Diagnosis Date  . GERD (gastroesophageal reflux disease)   . Hyperlipidemia   . Hypertension   . Obesity   . Stroke [facility])     Past Surgical History:  Procedure Laterality Date  . BRAIN SURGERY      There were no vitals filed for this visit.   Subjective Assessment - 02/23/20 1320    Subjective  Pt. reports that he is feeling good today    Patient is accompanied by: Family member    Pertinent History Pt. is a 54 y.o. male who suffered a CVA on 05/01/2016. Pt. was admitted to the hospital. Once discharged, he received Home Health PT and OT services for about a month. Pt. has had multiple CVAs over the past 8 years, and has had multiple falls in the past 6 months. Pt. resides in an apartment. Pt. has caregivers for 80 hours. Pt.'s mother stays with pt. at night, and assists with IADL tasks.    Currently in Pain? No/denies          OT TREATMENT   Neuro muscular re-education:  Pt. worked on [facility] skills grasping 1" sticks, 1/4" collars, and 1/4" washers. Pt. worked on storing the objects in the palm, and translatory skills moving the items from the palm of the hand to the tip of the 2nd digit, and thumb. Pt. worked on removing the pegs using bilateral  alternating hand patterns.3# dumbbell ex. for elbow flexion and extension, forearm supination/pronation, wrist flexion/extension, and radial deviation. Pt. requires rest breaks and verbal cues for proper technique.  Therapeutic Exercise:  Pt. worked on the [place] in forward motion with minimal resistance for 10 min. for strengthening, and ROM.   Pt. reports that a part is supposed to come in today, or tomorrow so that he can fix his hospital bed. Pt. reports that he has resumed sleeping in his bed. Pt. tolerated the BUE exercises well. Pt. dropped several items when performing translatory movements with his right hand. Pt. continues to work on improving RUE strength, and [facility] skills in order to improve, and maximize independence with ADLs, and IADL tasks.                       OT Education - 02/23/20 1321    Education provided No    Education Details grip exercises, strengthening    Person(s) Educated Patient    Methods Explanation;Demonstration            OT Short Term Goals - 03/03/17 1459      OT SHORT TERM GOAL #1   Title `  OT Long Term Goals - 02/16/20 1408      OT LONG TERM GOAL #1   Title Pt. will UE functioning to be able to perfrom toilet hygiene with minimal assistance.    Baseline 01/14/2018 - Pt completes toileting hygiene independently. Pt requires assistance managing LB clothing.    Time 12    Period Weeks    Status Achieved      OT LONG TERM GOAL #2   Title Pt. will complete self grooming with minA    Baseline 02/24/2018 - Pt is independent with oral care and washing his face and occasionally requires increased time if he is having a bad day. Pt's shaving is completed by his barber.    Time 12    Period Weeks    Status Achieved      OT LONG TERM GOAL #3   Title Pt. will perform self-feeding skills with modified indepndence    Baseline 01/14/2018 - Pt is able to complete self-feeding independently with minimal spillage. Pt  is unable to cut meat at this time.    Time 12    Period Weeks    Status Achieved      OT LONG TERM GOAL #4   Title Pt. will perform self dressing with minA and A/E as needed.    Baseline Pt. continues to be able to donn a T shirt independently with increased time. MinA donning a jacket, Mod A donning pants over his feet, and socks, minA with shoes.    Time 12    Period Weeks    Status On-going      OT LONG TERM GOAL #5   Title Pt. will perform light home making tasks with minA    Baseline Pt is able to wash dishes while seated in w/c with increased accessibility of new apartment. Pt. is able to fold his clothes, his moms puts them in the and out of the washer, and dryer, and puts them onto hangers. Caregivers perform vacuuming, and dusting.    Time 12    Period Weeks    Status Achieved      OT LONG TERM GOAL #6   Title Pt. will write his name efficiently with 100% legibility    Baseline Pt. continues to present with 75% legibility with increased time required when printing his name, and 50% legibility when cursive writing his name to sign in when riding the [place]    Time 12    Period Weeks    Status On-going      OT LONG TERM GOAL #7   Title Pt will independently and consistently follow HEP to increase UE strength to increase functional independence    Baseline Pt. continues to work towards independence with exercises    Time 12    Period Weeks    Status On-going      OT LONG TERM GOAL #8   Title Pt. will require supervision ironing a shirt.    Baseline Pt. has progressed CGA, and complete set-up seated using a tabletop iron    Time 12    Period Weeks    Status On-going      OT LONG TERM GOAL  #9   Baseline Pt. will independently be able to sew a button onto a shirt.    Time 12    Period Weeks    Status On-going      OT LONG TERM GOAL  #10   TITLE Pt. will independently be able to throw a ball  Baseline Pt. continues to be limited with throwing overhand, however pt.  is able to perform underhand throwing.    Time 12    Period Weeks    Status On-going      OT LONG TERM GOAL  #11   TITLE Pt. will improve UE functional reaching to be able to independently use his RUE to hand his clothes in the closest.    Baseline Pt. continues to improve with reaching up with the RUE, however has difficulty reaching the hanger rack in the closet. Pt. now uses a reacher.    Time 12    Period Weeks    Status On-going      OT LONG TERM GOAL  #12   TITLE Pt. will  be independent with shaving using an electric razer.    Baseline Pt. is able to use the electric razer independently, however is unable to access the position of the plug and requires assist form caregivers secondary to the size of the bathroom, and location of the plug.    Time 12    Period Weeks    Status Achieved      OT LONG TERM GOAL  #13   TITLE Pt. will increase BUE strength to be able to prepare for assisting with yardwork.    Baseline Pt. BUE strength 4+/5 Pt. reports that he has been able to get out to the front.    Time 12    Period Weeks    Status On-going      OT LONG TERM GOAL  #14   TITLE Pt. will improve activity toelrance in standing to be able to complete standing ADLs, and IADLs with SBA.    Baseline Pt. is limited with ADL performance in standing.    Time 12    Period Weeks    Status On-going                 Plan - 02/23/20 1322    Clinical Impression Statement Pt. reports that a part is supposed to come in today, or tomorrow so that he can fix his hospital bed. Pt. reports that he has resumed sleeping in his bed. Pt. tolerated the BUE exercises well. Pt. dropped several items when performing translatory movements with his right hand. Pt. continues to work on improving RUE strength, and [facility] skills in order to improve, and maximize independence with ADLs, and IADL tasks.   Occupational performance deficits (Please refer to evaluation for details): ADL's;IADL's    Body Structure  / Function / Physical Skills ADL;FMC;ROM;IADL;Pain;Coordination    Rehab Potential Fair    Clinical Decision Making Several treatment options, min-mod task modification necessary    Comorbidities Affecting Occupational Performance: May have comorbidities impacting occupational performance    Modification or Assistance to Complete Evaluation  Min-Moderate modification of tasks or assist with assess necessary to complete eval    OT Frequency 2x / week    OT Duration 12 weeks    OT Treatment/Interventions Self-care/ADL training;Patient/family education;DME and/or AE instruction;Therapeutic exercise;Neuromuscular education    Consulted and Agree with Plan of Care Patient           Patient will benefit from skilled therapeutic intervention in order to improve the following deficits and impairments:   Body Structure / Function / Physical Skills: ADL, FMC, ROM, IADL, Pain, Coordination       Visit Diagnosis: Muscle weakness (generalized)  Other lack of coordination    Problem List Patient Active Problem List   Diagnosis Date  Noted  . Acute CVA (cerebrovascular accident) ([place]) 05/02/2016  . Left-sided weakness 05/01/2016    Harrel Carina, MS, OTR/L 02/23/2020, 1:30 PM  [place] MAIN [facility] SERVICES [address] [place], [place], [address] Phone: [phone]   Fax:  [phone]  Name: SENICA CRALL MRN: [id number] Date of Birth: 02-10-1966

## 2020-02-28 ENCOUNTER — Encounter: Payer: Self-pay | Admitting: Occupational Therapy

## 2020-02-28 ENCOUNTER — Other Ambulatory Visit: Payer: Self-pay

## 2020-02-28 ENCOUNTER — Ambulatory Visit: Payer: Medicare HMO

## 2020-02-28 ENCOUNTER — Ambulatory Visit: Payer: Medicare HMO | Admitting: Occupational Therapy

## 2020-02-28 DIAGNOSIS — M6281 Muscle weakness (generalized): Secondary | ICD-10-CM

## 2020-02-28 DIAGNOSIS — R278 Other lack of coordination: Secondary | ICD-10-CM

## 2020-02-28 DIAGNOSIS — R2689 Other abnormalities of gait and mobility: Secondary | ICD-10-CM

## 2020-02-28 NOTE — Therapy (Signed)
[place] MAIN [facility] SERVICES [address] [place], [place], [address] Phone: [phone]   Fax:  [phone]  Physical Therapy Treatment  Patient Details  Name: Curtis Powell MRN: [id number] Date of Birth: 1965/04/20 No data recorded  Encounter Date: 02/28/2020   PT End of Session - 02/28/20 1332    Visit Number [id number]    Number of Visits 253    Date for PT Re-Evaluation 04/10/20    Authorization Type 9/10; PN 01/24/20    Authorization Time Period authorized 11/16/18-05/19/19    PT Start Time 1346    PT Stop Time 1429    PT Time Calculation (min) 43 min    Equipment Utilized During Treatment Gait belt    Activity Tolerance Patient tolerated treatment well;No increased pain    Behavior During Therapy WFL for tasks assessed/performed           Past Medical History:  Diagnosis Date  . GERD (gastroesophageal reflux disease)   . Hyperlipidemia   . Hypertension   . Obesity   . Stroke [facility])     Past Surgical History:  Procedure Laterality Date  . BRAIN SURGERY      There were no vitals filed for this visit.   Subjective Assessment - 02/28/20 1351    Subjective Patient reports having a good weekend. No falls or LOB since last session. Is happy because his team won over the weekend.    Pertinent History  Patient is a pleasant 54 year old male who presents to physical therapy for weakness and immobility secondary to CVA.  Had a stroke in Feb 2018. He was previously fully independent, but this stroke caused severe residual deficits, mainly on the right side as well as speech, and now he is unable to walk or perform most of his ADLs on his own. Entire right side is very weak. He still has a little difficulty with speech but his swallowing is improved to baseline. His mom had to move in with him and now is his main caregiver.     Limitations Sitting;Lifting;Standing;Walking;House hold activities    How long can you sit comfortably? 5 minutes     How long can you stand comfortably? 5 minutes    How long can you walk comfortably? 10 ft    Patient Stated Goals Walk without walker, walk further with walker, get some strength in back     Currently in Pain? No/denies             Treatment:  Nustep Lvl 4 RPM> 60 for cardiovascular challenge, seat at 7   Gait: Patient ambulates 120 ft with RW, CGA, and Wheelchair follow for safety. Patient requires min verbal cues for proximity to walker throughout gait trial as well as upright posture and weight shift.       Seated: GTB hamstring curl 15x each LE against PT resistance  GTB adduction 15x each LE    sit to stand 10x with decreasing UE support   5lb ankle weight:  -LAQ 15x 3-5 second holds each LE,  -march with upright posture 15x each LE -ER/IR 15x each LE   Standing interventions terminated due to R foot pain in weightbearing.   Pt educated throughout session about proper posture and technique with exercises. Improved exercise technique, movement at target joints, use of target muscles after min to mod verbal, visual, tactile cues.  PT Education - 02/28/20 1331    Education provided Yes    Education Details exercise technique, body mechanics    Person(s) Educated Patient    Methods Explanation;Demonstration;Tactile cues;Verbal cues    Comprehension Verbalized understanding;Returned demonstration;Verbal cues required            PT Short Term Goals - 01/24/20 1711      PT SHORT TERM GOAL #1   Title Patient will be independent in home exercise program to improve strength/mobility for better functional independence with ADLs.    Baseline hep compliant    Time 2    Period Weeks    Status Achieved      PT SHORT TERM GOAL #2   Title Patient will require min cueing for STS transfer with CGA for increased independence with mobility.     Baseline CGA    Time 2    Period Weeks    Status Achieved      PT SHORT TERM GOAL #3    Title Patient will maintain upright posture for > 15 seconds to demonstrate strengthened postural control muscualture     Baseline 18 seconds     Time 2    Period Weeks    Status Achieved             PT Long Term Goals - 01/24/20 1711      PT LONG TERM GOAL #1   Title Patient will perform bed mobility (rolling and supine <>sit) with Mod I  to increase mobility and  perform ADLs.     Baseline 9/13: able to perform with mod I occasional Min A when very fatigued 7/14: Min A 4/21: unable to perform 6/15: improving but still min-mod A     Time 12    Period Weeks    Status Partially Met    Target Date 04/10/20      PT LONG TERM GOAL #2   Title Patient (< 42 years old) will complete five times sit to stand test in < 10 seconds indicating an increased LE strength and improved balance.    Baseline  9/13: 14 seconds with one near LOB 7/14: 14.31 seconds 6/21: 16.86 seconds 4/21: 18.8 seconds 06/02/19: 19.93 seconds one hand on RW, one hand on w/c 12/30: 16 secpmds BUE support 02/11/19: 36 seconds 5/19: 20.6s from [facility], one hand on walker and one on WC, 80% upright posture 7/1: 22 seconds one hand on walker one hand on w/c  9/14: 48 seconds with full upright posture    Time 12    Period Weeks    Status Partially Met    Target Date 04/10/20      PT LONG TERM GOAL #3   Title Patient will negotiate a bathroom with a walker and Min A for increased indep in home and external environment    Baseline  9/13: able to some in standing, requires sitting down to wash hands 7/14: unable to do     Time 12    Period Weeks    Status Partially Met    Target Date 04/10/20      PT LONG TERM GOAL #4   Title Patient will increase BLE gross strength to 4+/5 as to improve functional strength for independent gait, increased standing tolerance and increased ADL ability.    Baseline  4/21: see note 06/02/19: hip flexion 4-/5, extension/abd 3/5 12/30: hip flexion 4/5 hip extension and abduction 3/5 02/11/19: hip  flexion 4/5 hip extension and abduction 2+/54-/5 RLE, 10/15: 4/5  RLE 11/7: 4/5 RLE 3/3: 4/5 7/1:  hip flexion 4/5 hip extension 2/5 hip abduction/adduciton 2/5     Time 12    Period Weeks    Status Partially Met    Target Date 04/10/20      PT LONG TERM GOAL #6   Title Patient will ambulate 200 ft with least assistive device and Supervision with no breaks to allow for increased mobility within home.    Baseline 9/13: performed in previous session 7/14: 190 ft with RW 4/21: ambulate 150 ft 6/21: had to terminate after 90 ft due to cardiac     Time 12    Period Weeks    Status Achieved      PT LONG TERM GOAL #7   Title Patient will perform 10 MWT in <30 seconds for improved gait speed, gait mechanics, and functional capacity for mobility.     Baseline 10/11: 48s with BRW and WC follow; 9/13: terminated due to wardrobe malfunction 9/8: 42 seconds with R foot collapse 7/14: 37.98 seconds 6/21: 46.9 seconds with RW 07/28/19: 51 seconds with RW 06/02/19: 62.5 seconds with RW 11/4: 56 seconds with AFO and RW 12/30: 53 seconds with AFO and RW     Time 12    Period Weeks    Status Partially Met    Target Date 04/10/20      PT LONG TERM GOAL #8   Title Patient will increase Berg Balance score by > 6 points (38/56) to demonstrate decreased fall risk during functional activities.    Baseline 9/13: 31/56 7/14: 33/56 07/28/19: 32/56 6/21: 32/56     Time 12    Period Weeks    Status Partially Met    Target Date 04/10/20                 Plan - 02/28/20 1416    Clinical Impression Statement Patient's session limited in standing due to new onset of R foot pain that began during ambulation. Reports no pain in seated only in weightbearing. Forward trunk position is improving however does regress as patient fatigues Pt would continue to benefit from skilled PT services in order to further strengthen LE's, improve static and dynamic balance, and improve coordination in order to increase functional  mobility and decrease risk of falls    Rehab Potential Fair    Clinical Impairments Affecting Rehab Potential hx of HTN, HLD, CVA, learning disability, Diabetes, brain tumor,     PT Frequency 2x / week    PT Duration 12 weeks    PT Treatment/Interventions ADLs/Self Care Home Management;Aquatic Therapy;Ultrasound;Moist Heat;Traction;DME Instruction;Gait training;Stair training;Functional mobility training;Therapeutic activities;Therapeutic exercise;Orthotic Fit/Training;Neuromuscular re-education;Balance training;Patient/family education;Manual techniques;Wheelchair mobility training;Passive range of motion;Energy conservation;Taping;Visual/perceptual remediation/compensation    PT Next Visit Plan progress standing balance    PT Home Exercise Plan TrA contraction, glute sets: needs updates. Potentially some time in supine flat in hospital bed, to improve hip extension deficits.     Consulted and Agree with Plan of Care Patient    Family Member Consulted mother           Patient will benefit from skilled therapeutic intervention in order to improve the following deficits and impairments:  Abnormal gait, Decreased activity tolerance, Decreased balance, Decreased knowledge of precautions, Decreased endurance, Decreased coordination, Decreased knowledge of use of DME, Decreased mobility, Decreased range of motion, Difficulty walking, Decreased safety awareness, Decreased strength, Impaired flexibility, Impaired perceived functional ability, Impaired tone, Postural dysfunction, Improper body mechanics, Pain  Visit Diagnosis: Muscle weakness (  generalized)  Other lack of coordination  Other abnormalities of gait and mobility     Problem List Patient Active Problem List   Diagnosis Date Noted  . Acute CVA (cerebrovascular accident) ([place]) 05/02/2016  . Left-sided weakness 05/01/2016   Janna Arch, PT, DPT   02/28/2020, 2:29 PM  [place] MAIN [facility]  SERVICES [address] [place], [place], [address] Phone: [phone]   Fax:  [phone]  Name: Curtis Powell MRN: [id number] Date of Birth: 1965-05-09

## 2020-02-28 NOTE — Therapy (Signed)
[place] MAIN [facility] SERVICES [address] [place], [place], [address] Phone: [phone]   Fax:  [phone]  Occupational Therapy Treatment  Patient Details  Name: Curtis Powell MRN: [id number] Date of Birth: 02-20-1966 No data recorded  Encounter Date: 02/28/2020   OT End of Session - 02/28/20 1309    Visit Number [id number]    Number of Visits 258    Date for OT Re-Evaluation 04/03/20    Authorization Type Progress reporting period starting 09/15/2019    OT Start Time 1300    OT Stop Time 1345    OT Time Calculation (min) 45 min    Activity Tolerance Patient tolerated treatment well    Behavior During Therapy [facility] for tasks assessed/performed           Past Medical History:  Diagnosis Date  . GERD (gastroesophageal reflux disease)   . Hyperlipidemia   . Hypertension   . Obesity   . Stroke [facility])     Past Surgical History:  Procedure Laterality Date  . BRAIN SURGERY      There were no vitals filed for this visit.   Subjective Assessment - 02/28/20 1308    Subjective  Pt. reports that he is feeling good today    Patient is accompanied by: Family member    Pertinent History Pt. is a 54 y.o. male who suffered a CVA on 05/01/2016. Pt. was admitted to the hospital. Once discharged, he received Home Health PT and OT services for about a month. Pt. has had multiple CVAs over the past 8 years, and has had multiple falls in the past 6 months. Pt. resides in an apartment. Pt. has caregivers for 80 hours. Pt.'s mother stays with pt. at night, and assists with IADL tasks.    Currently in Pain? No/denies          OT TREATMENT   Therapeutic Exercise:  Pt. performed4# dowel ex.for UE strengthening secondary to weakness. Bilateral shoulder flexion, chest press,circular, andpatterns were performedfor 1-2 sets20reps each. 4# dumbbell ex. for elbow flexion and extension, 4# forearm supination/pronation,andwrist flexion/extension1  set 20 reps each.Pt. requires rest breaks and verbal cues for proper technique.Pt. worked on [place] seated for 8 min with moderate resistanceforward, and reverse.  Pt. is making progress, andcontinuesto improveBUEstrengthoverall.Pt. continues to progress towards goals.Pt. continues to work onBUE strength, andfunctional reaching.Pt. continues tomake progress overall.Pt.continues toimprovewith LUE strength, and overall [place] skills.Pt.requires verbal, and tactile cues for the proper there. Ex.technique.Pt. continues to present with limited BUE strength, and [facility] skills. Pt.continues to work on improving UE strength, and [facility] skills in order to work towards maximizing independence with ADLs, and IADLtasks.                      OT Education - 02/28/20 1309    Education provided No    Person(s) Educated Patient    Methods Explanation;Demonstration    Comprehension Verbalized understanding;Returned demonstration;Verbal cues required            OT Short Term Goals - 03/03/17 1459      OT SHORT TERM GOAL #1   Title `             OT Long Term Goals - 02/16/20 1408      OT LONG TERM GOAL #1   Title Pt. will UE functioning to be able to perfrom toilet hygiene with minimal assistance.    Baseline 01/14/2018 - Pt completes toileting hygiene independently. Pt  requires assistance managing LB clothing.    Time 12    Period Weeks    Status Achieved      OT LONG TERM GOAL #2   Title Pt. will complete self grooming with minA    Baseline 02/24/2018 - Pt is independent with oral care and washing his face and occasionally requires increased time if he is having a bad day. Pt's shaving is completed by his barber.    Time 12    Period Weeks    Status Achieved      OT LONG TERM GOAL #3   Title Pt. will perform self-feeding skills with modified indepndence    Baseline 01/14/2018 - Pt is able to complete self-feeding independently with minimal spillage. Pt is  unable to cut meat at this time.    Time 12    Period Weeks    Status Achieved      OT LONG TERM GOAL #4   Title Pt. will perform self dressing with minA and A/E as needed.    Baseline Pt. continues to be able to donn a T shirt independently with increased time. MinA donning a jacket, Mod A donning pants over his feet, and socks, minA with shoes.    Time 12    Period Weeks    Status On-going      OT LONG TERM GOAL #5   Title Pt. will perform light home making tasks with minA    Baseline Pt is able to wash dishes while seated in w/c with increased accessibility of new apartment. Pt. is able to fold his clothes, his moms puts them in the and out of the washer, and dryer, and puts them onto hangers. Caregivers perform vacuuming, and dusting.    Time 12    Period Weeks    Status Achieved      OT LONG TERM GOAL #6   Title Pt. will write his name efficiently with 100% legibility    Baseline Pt. continues to present with 75% legibility with increased time required when printing his name, and 50% legibility when cursive writing his name to sign in when riding the [place]    Time 12    Period Weeks    Status On-going      OT LONG TERM GOAL #7   Title Pt will independently and consistently follow HEP to increase UE strength to increase functional independence    Baseline Pt. continues to work towards independence with exercises    Time 12    Period Weeks    Status On-going      OT LONG TERM GOAL #8   Title Pt. will require supervision ironing a shirt.    Baseline Pt. has progressed CGA, and complete set-up seated using a tabletop iron    Time 12    Period Weeks    Status On-going      OT LONG TERM GOAL  #9   Baseline Pt. will independently be able to sew a button onto a shirt.    Time 12    Period Weeks    Status On-going      OT LONG TERM GOAL  #10   TITLE Pt. will independently be able to throw a ball    Baseline Pt. continues to be limited with throwing overhand, however pt. is  able to perform underhand throwing.    Time 12    Period Weeks    Status On-going      OT LONG TERM GOAL  #11  TITLE Pt. will improve UE functional reaching to be able to independently use his RUE to hand his clothes in the closest.    Baseline Pt. continues to improve with reaching up with the RUE, however has difficulty reaching the hanger rack in the closet. Pt. now uses a reacher.    Time 12    Period Weeks    Status On-going      OT LONG TERM GOAL  #12   TITLE Pt. will  be independent with shaving using an electric razer.    Baseline Pt. is able to use the electric razer independently, however is unable to access the position of the plug and requires assist form caregivers secondary to the size of the bathroom, and location of the plug.    Time 12    Period Weeks    Status Achieved      OT LONG TERM GOAL  #13   TITLE Pt. will increase BUE strength to be able to prepare for assisting with yardwork.    Baseline Pt. BUE strength 4+/5 Pt. reports that he has been able to get out to the front.    Time 12    Period Weeks    Status On-going      OT LONG TERM GOAL  #14   TITLE Pt. will improve activity toelrance in standing to be able to complete standing ADLs, and IADLs with SBA.    Baseline Pt. is limited with ADL performance in standing.    Time 12    Period Weeks    Status On-going                 Plan - 02/28/20 1309    Clinical Impression Statement p    Occupational performance deficits (Please refer to evaluation for details): ADL's;IADL's    Body Structure / Function / Physical Skills ADL;FMC;ROM;IADL;Pain;Coordination    Rehab Potential Fair    Clinical Decision Making Several treatment options, min-mod task modification necessary    Comorbidities Affecting Occupational Performance: May have comorbidities impacting occupational performance    Modification or Assistance to Complete Evaluation  Min-Moderate modification of tasks or assist with assess necessary to  complete eval    OT Frequency 2x / week    OT Duration 12 weeks    OT Treatment/Interventions Self-care/ADL training;Patient/family education;DME and/or AE instruction;Therapeutic exercise;Neuromuscular education    Consulted and Agree with Plan of Care Patient           Patient will benefit from skilled therapeutic intervention in order to improve the following deficits and impairments:   Body Structure / Function / Physical Skills: ADL, FMC, ROM, IADL, Pain, Coordination       Visit Diagnosis: Muscle weakness (generalized)  Other lack of coordination    Problem List Patient Active Problem List   Diagnosis Date Noted  . Acute CVA (cerebrovascular accident) ([place]) 05/02/2016  . Left-sided weakness 05/01/2016    Harrel Carina, MS, OTR/L 02/28/2020, 1:13 PM  [place] MAIN [facility] SERVICES [address] [place], [place], [address] Phone: [phone]   Fax:  [phone]  Name: Curtis Powell MRN: [id number] Date of Birth: [birth year]

## 2020-03-01 ENCOUNTER — Encounter: Payer: Self-pay | Admitting: Occupational Therapy

## 2020-03-01 ENCOUNTER — Ambulatory Visit: Payer: Medicare HMO | Admitting: Physical Therapy

## 2020-03-01 ENCOUNTER — Ambulatory Visit: Payer: Medicare HMO | Admitting: Occupational Therapy

## 2020-03-01 ENCOUNTER — Other Ambulatory Visit: Payer: Self-pay

## 2020-03-01 DIAGNOSIS — R2689 Other abnormalities of gait and mobility: Secondary | ICD-10-CM

## 2020-03-01 DIAGNOSIS — R278 Other lack of coordination: Secondary | ICD-10-CM

## 2020-03-01 DIAGNOSIS — M6281 Muscle weakness (generalized): Secondary | ICD-10-CM

## 2020-03-01 NOTE — Therapy (Signed)
[place] MAIN [facility] SERVICES [address] [place], [place], [address] Phone: [phone]   Fax:  [phone]  Physical Therapy Treatment/Progress Note Dates of reporting period  01/24/2020   to   03/01/2020  Patient Details  Name: Curtis Powell MRN: [id number] Date of Birth: Nov 22, 1965 No data recorded  Encounter Date: 03/01/2020   PT End of Session - 03/01/20 1350    Visit Number [id number]    Number of Visits 253    Date for PT Re-Evaluation 04/10/20    Authorization Type 9/10; PN 01/24/20, PN 03/01/2020    Authorization Time Period authorized 11/16/18-05/19/19    PT Start Time 1350    PT Stop Time 1430    PT Time Calculation (min) 40 min    Equipment Utilized During Treatment Gait belt    Activity Tolerance Patient tolerated treatment well;No increased pain    Behavior During Therapy WFL for tasks assessed/performed           Past Medical History:  Diagnosis Date  . GERD (gastroesophageal reflux disease)   . Hyperlipidemia   . Hypertension   . Obesity   . Stroke [facility])     Past Surgical History:  Procedure Laterality Date  . BRAIN SURGERY      There were no vitals filed for this visit.   Subjective Assessment - 03/01/20 1434    Subjective Patient reports that he feels fair today. Just doesn't feel like himself and is fatigued today.    Pertinent History  Patient is a pleasant 54 year old male who presents to physical therapy for weakness and immobility secondary to CVA.  Had a stroke in Feb 2018. He was previously fully independent, but this stroke caused severe residual deficits, mainly on the right side as well as speech, and now he is unable to walk or perform most of his ADLs on his own. Entire right side is very weak. He still has a little difficulty with speech but his swallowing is improved to baseline. His mom had to move in with him and now is his main caregiver.     Limitations Sitting;Lifting;Standing;Walking;House hold  activities    How long can you sit comfortably? 5 minutes    How long can you stand comfortably? 5 minutes    How long can you walk comfortably? 10 ft    Patient Stated Goals Walk without walker, walk further with walker, get some strength in back     Currently in Pain? No/denies            Patient demonstrates fair motivation during outcome measures today. His 5 times sit to stand has decreased significantly with upright posture using unilateral UE with 2WW from 48 seconds at last progress note to 25 seconds today. Patient continues to have deficits in balance and strength as evidenced by minimal change in MMT grade and Berg Balance. Subjective measures are not improving or worsening however objective measures have demonstrated a slight improvement. Patient is yet to achieve maximal benefit from skilled PT services and will continue to benefit from additional therapy to see improvements in function.         PT Short Term Goals - 01/24/20 1711      PT SHORT TERM GOAL #1   Title Patient will be independent in home exercise program to improve strength/mobility for better functional independence with ADLs.    Baseline hep compliant    Time 2    Period Weeks    Status  Achieved      PT SHORT TERM GOAL #2   Title Patient will require min cueing for STS transfer with CGA for increased independence with mobility.     Baseline CGA    Time 2    Period Weeks    Status Achieved      PT SHORT TERM GOAL #3   Title Patient will maintain upright posture for > 15 seconds to demonstrate strengthened postural control muscualture     Baseline 18 seconds     Time 2    Period Weeks    Status Achieved             PT Long Term Goals - 03/01/20 0001      PT LONG TERM GOAL #1   Title Patient will perform bed mobility (rolling and supine <>sit) with Mod I  to increase mobility and  perform ADLs.     Baseline 9/13: able to perform with mod I occasional Min A when very fatigued 7/14: Min A  4/21: unable to perform 6/15: improving but still min-mod A  11/24: min-modA, difficulty breathing with changing positions    Time 12    Period Weeks    Status Partially Met    Target Date 04/10/20      PT LONG TERM GOAL #2   Title Patient (< 85 years old) will complete five times sit to stand test in < 10 seconds indicating an increased LE strength and improved balance.    Baseline  9/13: 14 seconds with one near LOB 7/14: 14.31 seconds 6/21: 16.86 seconds 4/21: 18.8 seconds 06/02/19: 19.93 seconds one hand on RW, one hand on w/c 12/30: 16 secpmds BUE support 02/11/19: 36 seconds 5/19: 20.6s from [facility], one hand on walker and one on WC, 80% upright posture 7/1: 22 seconds one hand on walker one hand on w/c  9/14: 48 seconds with full upright posture 11/24: 25 seconds with full upright posture, one hand on 2WW    Time 12    Period Weeks    Status Partially Met    Target Date 04/10/20      PT LONG TERM GOAL #3   Title Patient will negotiate a bathroom with a walker and Min A for increased indep in home and external environment    Baseline  9/13: able to some in standing, requires sitting down to wash hands 7/14: unable to do  11/24: able to complete voiding in standing and requires sitting down to wash hands    Time 12    Period Weeks    Status Partially Met    Target Date 04/10/20      PT LONG TERM GOAL #4   Title Patient will increase BLE gross strength to 4+/5 as to improve functional strength for independent gait, increased standing tolerance and increased ADL ability.    Baseline  4/21: see note 06/02/19: hip flexion 4-/5, extension/abd 3/5 12/30: hip flexion 4/5 hip extension and abduction 3/5 02/11/19: hip flexion 4/5 hip extension and abduction 2+/54-/5 RLE, 10/15: 4/5 RLE 11/7: 4/5 RLE 3/3: 4/5 7/1:  hip flexion 4/5 hip extension 2/5 hip abduction/adduciton 2/5     Time 12    Period Weeks    Status Partially Met    Target Date 04/10/20      PT LONG TERM GOAL #6   Title Patient will  ambulate 200 ft with least assistive device and Supervision with no breaks to allow for increased mobility within home.    Baseline  9/13: performed in previous session 7/14: 190 ft with RW 4/21: ambulate 150 ft 6/21: had to terminate after 90 ft due to cardiac  11/24: level surface with 2WW for 65 ft, limited due to fatigue and not feeling good today    Time 12    Period Weeks    Status Achieved    Target Date 04/11/19      PT LONG TERM GOAL #7   Title Patient will perform 10 MWT in <30 seconds for improved gait speed, gait mechanics, and functional capacity for mobility.     Baseline 10/11: 48s with BRW and WC follow; 9/13: terminated due to wardrobe malfunction 9/8: 42 seconds with R foot collapse 7/14: 37.98 seconds 6/21: 46.9 seconds with RW 07/28/19: 51 seconds with RW 06/02/19: 62.5 seconds with RW 11/4: 56 seconds with AFO and RW 12/30: 53 seconds with AFO and RW     Time 12    Period Weeks    Status Partially Met    Target Date 04/10/20      PT LONG TERM GOAL #8   Title Patient will increase Berg Balance score by > 6 points (38/56) to demonstrate decreased fall risk during functional activities.    Baseline 9/13: 31/56 7/14: 33/56 07/28/19: 32/56 6/21: 32/56     Time 12    Period Weeks    Status Partially Met    Target Date 04/10/20                 Plan - 03/01/20 1451    Clinical Impression Statement Patient demonstrates fair motivation during outcome measures today. His 5 times sit to stand has decreased significantly with upright posture using unilateral UE with 2WW from 48 seconds at last progress note to 25 seconds today. Patient continues to have deficits in balance and strength as evidenced by minimal change in MMT grade and Berg Balance. Subjective measures are not improving or worsening however objective measures have demonstrated a slight improvement. Patient is yet to achieve maximal benefit from skilled PT services and will continue to benefit from additional therapy  to see improvements in function.    Rehab Potential Fair    Clinical Impairments Affecting Rehab Potential hx of HTN, HLD, CVA, learning disability, Diabetes, brain tumor,     PT Frequency 2x / week    PT Duration 12 weeks    PT Treatment/Interventions ADLs/Self Care Home Management;Aquatic Therapy;Ultrasound;Moist Heat;Traction;DME Instruction;Gait training;Stair training;Functional mobility training;Therapeutic activities;Therapeutic exercise;Orthotic Fit/Training;Neuromuscular re-education;Balance training;Patient/family education;Manual techniques;Wheelchair mobility training;Passive range of motion;Energy conservation;Taping;Visual/perceptual remediation/compensation    PT Next Visit Plan progress standing balance    PT Home Exercise Plan TrA contraction, glute sets: needs updates. Potentially some time in supine flat in hospital bed, to improve hip extension deficits.     Consulted and Agree with Plan of Care Patient    Family Member Consulted mother           Patient will benefit from skilled therapeutic intervention in order to improve the following deficits and impairments:  Abnormal gait, Decreased activity tolerance, Decreased balance, Decreased knowledge of precautions, Decreased endurance, Decreased coordination, Decreased knowledge of use of DME, Decreased mobility, Decreased range of motion, Difficulty walking, Decreased safety awareness, Decreased strength, Impaired flexibility, Impaired perceived functional ability, Impaired tone, Postural dysfunction, Improper body mechanics, Pain  Visit Diagnosis: Muscle weakness (generalized)  Other abnormalities of gait and mobility     Problem List Patient Active Problem List   Diagnosis Date Noted  . Acute CVA (cerebrovascular accident) ([place])  05/02/2016  . Left-sided weakness 05/01/2016   Karl Luke PT, DPT Netta Corrigan 03/01/2020, 2:58 PM  [place] MAIN [facility] SERVICES [address] [place], [place], [address] Phone: [phone]   Fax:  [phone]  Name: NICHOLUS CHANDRAN MRN: [id number] Date of Birth: Sep 04, 1965

## 2020-03-01 NOTE — Therapy (Signed)
[place] MAIN [facility] SERVICES [address] [place], [place], [address] Phone: [phone]   Fax:  [phone]  Occupational Therapy Treatment  Patient Details  Name: Curtis Powell MRN: [id number] Date of Birth: [birth year] No data recorded  Encounter Date: 03/01/2020   OT End of Session - 03/01/20 1310    Visit Number [id number]    Number of Visits 258    Date for OT Re-Evaluation 04/03/20    Authorization Type Progress reporting period starting 09/15/2019    OT Start Time 1300    OT Stop Time 1345    OT Time Calculation (min) 45 min    Activity Tolerance Patient tolerated treatment well           Past Medical History:  Diagnosis Date  . GERD (gastroesophageal reflux disease)   . Hyperlipidemia   . Hypertension   . Obesity   . Stroke [facility])     Past Surgical History:  Procedure Laterality Date  . BRAIN SURGERY      There were no vitals filed for this visit.   Subjective Assessment - 03/01/20 1310    Subjective  Pt. reports that he is feeling good today    Patient is accompanied by: Family member    Pertinent History Pt. is a 54 y.o. male who suffered a CVA on 05/01/2016. Pt. was admitted to the hospital. Once discharged, he received Home Health PT and OT services for about a month. Pt. has had multiple CVAs over the past 8 years, and has had multiple falls in the past 6 months. Pt. resides in an apartment. Pt. has caregivers for 80 hours. Pt.'s mother stays with pt. at night, and assists with IADL tasks.    Currently in Pain? No/denies          OT TREATMENT    Selfcare:  Pt. worked on TEFL teacher of Thanksgiving related food items in printed, and cursive form with cues for spelling, and letter formation.  Pt. worked on Investment banker, corporate various lines requiring increased time to complete.   Pt. formulated a list of words in printed form with 50% legibility, and cursive form with 25% legibility. Pt.  required visual cues, and verbal cues for spelling with the words printed out onto a list. Pt. required increased time to complete. Pt. deviated form the line during prewriting tasks 50% of the time. Pt. continues to work on improving hand function, and [facility] skills  In order to work towards improving writing legibility.                       OT Education - 03/01/20 1310    Education provided Yes    Person(s) Educated Patient    Methods Explanation;Demonstration    Comprehension Verbalized understanding;Returned demonstration;Verbal cues required            OT Short Term Goals - 03/03/17 1459      OT SHORT TERM GOAL #1   Title `             OT Long Term Goals - 02/16/20 1408      OT LONG TERM GOAL #1   Title Pt. will UE functioning to be able to perfrom toilet hygiene with minimal assistance.    Baseline 01/14/2018 - Pt completes toileting hygiene independently. Pt requires assistance managing LB clothing.    Time 12    Period Weeks    Status Achieved  OT LONG TERM GOAL #2   Title Pt. will complete self grooming with minA    Baseline 02/24/2018 - Pt is independent with oral care and washing his face and occasionally requires increased time if he is having a bad day. Pt's shaving is completed by his barber.    Time 12    Period Weeks    Status Achieved      OT LONG TERM GOAL #3   Title Pt. will perform self-feeding skills with modified indepndence    Baseline 01/14/2018 - Pt is able to complete self-feeding independently with minimal spillage. Pt is unable to cut meat at this time.    Time 12    Period Weeks    Status Achieved      OT LONG TERM GOAL #4   Title Pt. will perform self dressing with minA and A/E as needed.    Baseline Pt. continues to be able to donn a T shirt independently with increased time. MinA donning a jacket, Mod A donning pants over his feet, and socks, minA with shoes.    Time 12    Period Weeks    Status On-going      OT  LONG TERM GOAL #5   Title Pt. will perform light home making tasks with minA    Baseline Pt is able to wash dishes while seated in w/c with increased accessibility of new apartment. Pt. is able to fold his clothes, his moms puts them in the and out of the washer, and dryer, and puts them onto hangers. Caregivers perform vacuuming, and dusting.    Time 12    Period Weeks    Status Achieved      OT LONG TERM GOAL #6   Title Pt. will write his name efficiently with 100% legibility    Baseline Pt. continues to present with 75% legibility with increased time required when printing his name, and 50% legibility when cursive writing his name to sign in when riding the [place]    Time 12    Period Weeks    Status On-going      OT LONG TERM GOAL #7   Title Pt will independently and consistently follow HEP to increase UE strength to increase functional independence    Baseline Pt. continues to work towards independence with exercises    Time 12    Period Weeks    Status On-going      OT LONG TERM GOAL #8   Title Pt. will require supervision ironing a shirt.    Baseline Pt. has progressed CGA, and complete set-up seated using a tabletop iron    Time 12    Period Weeks    Status On-going      OT LONG TERM GOAL  #9   Baseline Pt. will independently be able to sew a button onto a shirt.    Time 12    Period Weeks    Status On-going      OT LONG TERM GOAL  #10   TITLE Pt. will independently be able to throw a ball    Baseline Pt. continues to be limited with throwing overhand, however pt. is able to perform underhand throwing.    Time 12    Period Weeks    Status On-going      OT LONG TERM GOAL  #11   TITLE Pt. will improve UE functional reaching to be able to independently use his RUE to hand his clothes in the closest.  Baseline Pt. continues to improve with reaching up with the RUE, however has difficulty reaching the hanger rack in the closet. Pt. now uses a reacher.    Time 12     Period Weeks    Status On-going      OT LONG TERM GOAL  #12   TITLE Pt. will  be independent with shaving using an electric razer.    Baseline Pt. is able to use the electric razer independently, however is unable to access the position of the plug and requires assist form caregivers secondary to the size of the bathroom, and location of the plug.    Time 12    Period Weeks    Status Achieved      OT LONG TERM GOAL  #13   TITLE Pt. will increase BUE strength to be able to prepare for assisting with yardwork.    Baseline Pt. BUE strength 4+/5 Pt. reports that he has been able to get out to the front.    Time 12    Period Weeks    Status On-going      OT LONG TERM GOAL  #14   TITLE Pt. will improve activity toelrance in standing to be able to complete standing ADLs, and IADLs with SBA.    Baseline Pt. is limited with ADL performance in standing.    Time 12    Period Weeks    Status On-going                 Plan - 03/01/20 1311    Clinical Impression Statement Pt. formulated a list of words in printed form with 50% legibility, and cursive form with 25% legibility. Pt. required visual cues, and verbal cues for spelling with the words printed out onto a list. Pt. required increased time to complete. Pt. deviated form the line during prewriting tasks 50% of the time. Pt. continues to work on improving hand function, and [facility] skills  In order to work towards improving writing legibility.    OT Occupational Profile and History Problem Focused Assessment - Including review of records relating to presenting problem    Occupational performance deficits (Please refer to evaluation for details): ADL's;IADL's    Body Structure / Function / Physical Skills ADL;FMC;ROM;IADL;Pain;Coordination    Rehab Potential Fair    Clinical Decision Making Several treatment options, min-mod task modification necessary    Comorbidities Affecting Occupational Performance: May have comorbidities impacting  occupational performance    Modification or Assistance to Complete Evaluation  Min-Moderate modification of tasks or assist with assess necessary to complete eval    OT Frequency 2x / week    OT Duration 12 weeks    OT Treatment/Interventions Self-care/ADL training;Patient/family education;DME and/or AE instruction;Therapeutic exercise;Neuromuscular education    Consulted and Agree with Plan of Care Patient           Patient will benefit from skilled therapeutic intervention in order to improve the following deficits and impairments:   Body Structure / Function / Physical Skills: ADL, FMC, ROM, IADL, Pain, Coordination       Visit Diagnosis: Muscle weakness (generalized)  Other lack of coordination    Problem List Patient Active Problem List   Diagnosis Date Noted  . Acute CVA (cerebrovascular accident) ([place]) 05/02/2016  . Left-sided weakness 05/01/2016    Harrel Carina, MS, OTR/L 03/01/2020, 1:19 PM  [place] MAIN [facility] SERVICES [address] [place], [place], [address] Phone: [phone]   Fax:  [phone]  Name: Curtis Powell MRN: [id number] Date of Birth: 1966-03-12

## 2020-03-06 ENCOUNTER — Ambulatory Visit: Payer: Medicare HMO

## 2020-03-06 ENCOUNTER — Ambulatory Visit: Payer: Medicare HMO | Admitting: Occupational Therapy

## 2020-03-06 ENCOUNTER — Encounter: Payer: Self-pay | Admitting: Occupational Therapy

## 2020-03-06 ENCOUNTER — Other Ambulatory Visit: Payer: Self-pay

## 2020-03-06 DIAGNOSIS — R278 Other lack of coordination: Secondary | ICD-10-CM

## 2020-03-06 DIAGNOSIS — M6281 Muscle weakness (generalized): Secondary | ICD-10-CM

## 2020-03-06 DIAGNOSIS — R2689 Other abnormalities of gait and mobility: Secondary | ICD-10-CM

## 2020-03-06 NOTE — Therapy (Signed)
[place] MAIN [facility] SERVICES [address] [place], [place], [address] Phone: [phone]   Fax:  [phone]  Physical Therapy Treatment  Patient Details  Name: Curtis Powell MRN: [id number] Date of Birth: [birth year] No data recorded  Encounter Date: 03/06/2020   PT End of Session - 03/06/20 1751    Visit Number [id number]    Number of Visits 253    Date for PT Re-Evaluation 04/10/20    Authorization Type 1/10 PN 03/01/2020    Authorization Time Period authorized 11/16/18-05/19/19    PT Start Time 1345    PT Stop Time 1429    PT Time Calculation (min) 44 min    Equipment Utilized During Treatment Gait belt    Activity Tolerance Patient tolerated treatment well;No increased pain    Behavior During Therapy WFL for tasks assessed/performed           Past Medical History:  Diagnosis Date  . GERD (gastroesophageal reflux disease)   . Hyperlipidemia   . Hypertension   . Obesity   . Stroke [facility])     Past Surgical History:  Procedure Laterality Date  . BRAIN SURGERY      There were no vitals filed for this visit.   Subjective Assessment - 03/06/20 1746    Subjective Patient reports he had a good thanksgiving. Has been compliant with seated exercises but not walking due to the holidays. No falls or LOB since last session .    Pertinent History  Patient is a pleasant 54 year old male who presents to physical therapy for weakness and immobility secondary to CVA.  Had a stroke in Feb 2018. He was previously fully independent, but this stroke caused severe residual deficits, mainly on the right side as well as speech, and now he is unable to walk or perform most of his ADLs on his own. Entire right side is very weak. He still has a little difficulty with speech but his swallowing is improved to baseline. His mom had to move in with him and now is his main caregiver.     Limitations Sitting;Lifting;Standing;Walking;House hold activities    How long  can you sit comfortably? 5 minutes    How long can you stand comfortably? 5 minutes    How long can you walk comfortably? 10 ft    Patient Stated Goals Walk without walker, walk further with walker, get some strength in back     Currently in Pain? No/denies              [facility] PT Assessment - 03/06/20 0001      Standardized Balance Assessment   Standardized Balance Assessment Berg Balance Test      Berg Balance Test   Sit to Stand Able to stand  independently using hands    Standing Unsupported Able to stand 2 minutes with supervision    Sitting with Back Unsupported but Feet Supported on Floor or Stool Able to sit safely and securely 2 minutes    Stand to Sit Controls descent by using hands    Transfers Able to transfer safely, definite need of hands    Standing Unsupported with Eyes Closed Able to stand 10 seconds with supervision    Standing Unsupported with Feet Together Needs help to attain position but able to stand for 30 seconds with feet together    From Standing, Reach Forward with Outstretched Arm Can reach forward >5 cm safely (2")    From Standing Position, Pick  up Object from [place] to pick up shoe, needs supervision    From Standing Position, Turn to Look Behind Over each Shoulder Looks behind one side only/other side shows less weight shift    Turn 360 Degrees Needs assistance while turning    Standing Unsupported, Alternately Place Feet on Step/Stool Able to complete >2 steps/needs minimal assist    Standing Unsupported, One Foot in Front Needs help to step but can hold 15 seconds    Standing on One Leg Unable to try or needs assist to prevent fall    Total Score 30              Goals:  5x STS: 17.22 one hand on walker one hand on wheelchair, limited motion on last 2 stands:  10 MWT : 41.57 seconds BERG: 30/56   Treatment:  Patient ambulates80 ft withRW, CGA, andWheelchair follow for safety. Patient requires min verbal cues for proximity to walker  throughout gait trialas well as upright posture and weight shift.Terminated early due to excessive R foot inversion with fatigue.   Static stand: no UE support 30 seconds  Stand with RW in front and chair behind: -shadow box 2x30 seconds; occasional posterior LOB, improved ability to regain Com without UE support  Seated due to increasing R foot inversion with standing:  GTB hamstring curls 15x RTB 4 way strengthening 10x each LE  Pt educated throughout session about proper posture and technique with exercises. Improved exercise technique, movement at target joints, use of target muscles after min to mod verbal, visual, tactile cues.           PT Education - 03/06/20 1746    Education provided Yes    Education Details exercise technique, goals, body mechanics    Person(s) Educated Patient    Methods Explanation;Demonstration;Tactile cues;Verbal cues    Comprehension Verbalized understanding;Returned demonstration;Verbal cues required;Tactile cues required            PT Short Term Goals - 01/24/20 1711      PT SHORT TERM GOAL #1   Title Patient will be independent in home exercise program to improve strength/mobility for better functional independence with ADLs.    Baseline hep compliant    Time 2    Period Weeks    Status Achieved      PT SHORT TERM GOAL #2   Title Patient will require min cueing for STS transfer with CGA for increased independence with mobility.     Baseline CGA    Time 2    Period Weeks    Status Achieved      PT SHORT TERM GOAL #3   Title Patient will maintain upright posture for > 15 seconds to demonstrate strengthened postural control muscualture     Baseline 18 seconds     Time 2    Period Weeks    Status Achieved             PT Long Term Goals - 03/01/20 0001      PT LONG TERM GOAL #1   Title Patient will perform bed mobility (rolling and supine <>sit) with Mod I  to increase mobility and  perform ADLs.     Baseline 9/13: able  to perform with mod I occasional Min A when very fatigued 7/14: Min A 4/21: unable to perform 6/15: improving but still min-mod A  11/24: min-modA, difficulty breathing with changing positions    Time 12    Period Weeks    Status Partially  Met    Target Date 04/10/20      PT LONG TERM GOAL #2   Title Patient (< 58 years old) will complete five times sit to stand test in < 10 seconds indicating an increased LE strength and improved balance.    Baseline  9/13: 14 seconds with one near LOB 7/14: 14.31 seconds 6/21: 16.86 seconds 4/21: 18.8 seconds 06/02/19: 19.93 seconds one hand on RW, one hand on w/c 12/30: 16 secpmds BUE support 02/11/19: 36 seconds 5/19: 20.6s from [facility], one hand on walker and one on WC, 80% upright posture 7/1: 22 seconds one hand on walker one hand on w/c  9/14: 48 seconds with full upright posture 11/24: 25 seconds with full upright posture, one hand on 2WW    Time 12    Period Weeks    Status Partially Met    Target Date 04/10/20      PT LONG TERM GOAL #3   Title Patient will negotiate a bathroom with a walker and Min A for increased indep in home and external environment    Baseline  9/13: able to some in standing, requires sitting down to wash hands 7/14: unable to do  11/24: able to complete voiding in standing and requires sitting down to wash hands    Time 12    Period Weeks    Status Partially Met    Target Date 04/10/20      PT LONG TERM GOAL #4   Title Patient will increase BLE gross strength to 4+/5 as to improve functional strength for independent gait, increased standing tolerance and increased ADL ability.    Baseline  4/21: see note 06/02/19: hip flexion 4-/5, extension/abd 3/5 12/30: hip flexion 4/5 hip extension and abduction 3/5 02/11/19: hip flexion 4/5 hip extension and abduction 2+/54-/5 RLE, 10/15: 4/5 RLE 11/7: 4/5 RLE 3/3: 4/5 7/1:  hip flexion 4/5 hip extension 2/5 hip abduction/adduciton 2/5     Time 12    Period Weeks    Status Partially Met     Target Date 04/10/20      PT LONG TERM GOAL #6   Title Patient will ambulate 200 ft with least assistive device and Supervision with no breaks to allow for increased mobility within home.    Baseline 9/13: performed in previous session 7/14: 190 ft with RW 4/21: ambulate 150 ft 6/21: had to terminate after 90 ft due to cardiac  11/24: level surface with 2WW for 65 ft, limited due to fatigue and not feeling good today    Time 12    Period Weeks    Status Achieved    Target Date 04/11/19      PT LONG TERM GOAL #7   Title Patient will perform 10 MWT in <30 seconds for improved gait speed, gait mechanics, and functional capacity for mobility.     Baseline 10/11: 48s with BRW and WC follow; 9/13: terminated due to wardrobe malfunction 9/8: 42 seconds with R foot collapse 7/14: 37.98 seconds 6/21: 46.9 seconds with RW 07/28/19: 51 seconds with RW 06/02/19: 62.5 seconds with RW 11/4: 56 seconds with AFO and RW 12/30: 53 seconds with AFO and RW     Time 12    Period Weeks    Status Partially Met    Target Date 04/10/20      PT LONG TERM GOAL #8   Title Patient will increase Berg Balance score by > 6 points (38/56) to demonstrate decreased fall risk during functional activities.  Baseline 9/13: 31/56 7/14: 33/56 07/28/19: 32/56 6/21: 32/56     Time 12    Period Weeks    Status Partially Met    Target Date 04/10/20                 Plan - 03/06/20 1758    Clinical Impression Statement Patient is challenged with increased right foot internal rotation in weightbearing this session requiring increased rest breaks and regression from standing interventions. Patient is improving hip flexion and foot clearance with visual/tactile cue. Improved ability to maintain a more neutral spinal alignment noted however regression to trunk flexion with prolonged mobility noted with fatigue. Patient will benefit form continued skilled physical therapy to improve mobility, stability and decrease fall risk.     Rehab Potential Fair    Clinical Impairments Affecting Rehab Potential hx of HTN, HLD, CVA, learning disability, Diabetes, brain tumor,     PT Frequency 2x / week    PT Duration 12 weeks    PT Treatment/Interventions ADLs/Self Care Home Management;Aquatic Therapy;Ultrasound;Moist Heat;Traction;DME Instruction;Gait training;Stair training;Functional mobility training;Therapeutic activities;Therapeutic exercise;Orthotic Fit/Training;Neuromuscular re-education;Balance training;Patient/family education;Manual techniques;Wheelchair mobility training;Passive range of motion;Energy conservation;Taping;Visual/perceptual remediation/compensation    PT Next Visit Plan progress standing balance    PT Home Exercise Plan TrA contraction, glute sets: needs updates. Potentially some time in supine flat in hospital bed, to improve hip extension deficits.     Consulted and Agree with Plan of Care Patient    Family Member Consulted mother           Patient will benefit from skilled therapeutic intervention in order to improve the following deficits and impairments:  Abnormal gait, Decreased activity tolerance, Decreased balance, Decreased knowledge of precautions, Decreased endurance, Decreased coordination, Decreased knowledge of use of DME, Decreased mobility, Decreased range of motion, Difficulty walking, Decreased safety awareness, Decreased strength, Impaired flexibility, Impaired perceived functional ability, Impaired tone, Postural dysfunction, Improper body mechanics, Pain  Visit Diagnosis: Muscle weakness (generalized)  Other lack of coordination  Other abnormalities of gait and mobility     Problem List Patient Active Problem List   Diagnosis Date Noted  . Acute CVA (cerebrovascular accident) ([place]) 05/02/2016  . Left-sided weakness 05/01/2016   Janna Arch, PT, DPT   03/06/2020, 5:59 PM  [place] MAIN [facility] SERVICES [address] [place],  [place], [address] Phone: [phone]   Fax:  [phone]  Name: DESHAUN WEISINGER MRN: [id number] Date of Birth: [birth year]

## 2020-03-06 NOTE — Therapy (Signed)
[place] MAIN [facility] SERVICES [address] [place], [place], [address] Phone: [phone]   Fax:  [phone]  Occupational Therapy Treatment  Patient Details  Name: Curtis Powell MRN: [id number] Date of Birth: [birth year] No data recorded  Encounter Date: 03/06/2020   OT End of Session - 03/06/20 1306    Visit Number [id number]    Number of Visits 258    Date for OT Re-Evaluation 04/03/20    Authorization Type Progress reporting period starting 09/15/2019    OT Start Time 1300    OT Stop Time 1345    OT Time Calculation (min) 45 min    Activity Tolerance Patient tolerated treatment well    Behavior During Therapy [facility] for tasks assessed/performed           Past Medical History:  Diagnosis Date  . GERD (gastroesophageal reflux disease)   . Hyperlipidemia   . Hypertension   . Obesity   . Stroke [facility])     Past Surgical History:  Procedure Laterality Date  . BRAIN SURGERY      There were no vitals filed for this visit.   Subjective Assessment - 03/06/20 1306    Subjective  Pt. reports that he is feeling good today    Patient is accompanied by: Family member    Pertinent History Pt. is a 54 y.o. male who suffered a CVA on 05/01/2016. Pt. was admitted to the hospital. Once discharged, he received Home Health PT and OT services for about a month. Pt. has had multiple CVAs over the past 8 years, and has had multiple falls in the past 6 months. Pt. resides in an apartment. Pt. has caregivers for 80 hours. Pt.'s mother stays with pt. at night, and assists with IADL tasks.    Currently in Pain? No/denies          OT TREATMENT   Therapeutic Exercise:  Pt. performed4# dowel ex.for UE strengthening secondary to weakness. Bilateral shoulder flexion, chest press,circular, andpatterns were performedfor 1-2 sets20reps each. 2# dumbbell weights for shoulder flexion 3#, and air punches. dumbbell ex. for elbow flexion and extension, 3 #  forearm supination/pronation,andwrist flexion/extension, radial deviation for1 set 20 reps each.Pt. requires rest breaks and verbal cues for proper technique.Pt. worked on [place] seated for 8 min with moderate resistanceforward, and reverse.  Pt. is making progress, andcontinuesto improveBUEstrengthoverall.Pt.continues to progress towards goals.Pt. continues to work onBUE strength, andfunctional reaching.Pt. continues tomake progress overall.Pt.continues toimprovewith LUE strength, and overall [place] skills.Pt.requires verbal, and tactile cues forthe properthere. Ex.technique.Pt. continues to present with limited BUE strength, and [facility] skills. Pt.continues to work on improving UE strength, and [facility] skills in order to work towards maximizing independence with ADLs, and IADLtasks.                      OT Education - 03/06/20 1306    Education provided Yes    Person(s) Educated Patient    Methods Explanation;Demonstration    Comprehension Verbalized understanding;Returned demonstration;Verbal cues required            OT Short Term Goals - 03/03/17 1459      OT SHORT TERM GOAL #1   Title `             OT Long Term Goals - 02/16/20 1408      OT LONG TERM GOAL #1   Title Pt. will UE functioning to be able to perfrom toilet hygiene with minimal assistance.  Baseline 01/14/2018 - Pt completes toileting hygiene independently. Pt requires assistance managing LB clothing.    Time 12    Period Weeks    Status Achieved      OT LONG TERM GOAL #2   Title Pt. will complete self grooming with minA    Baseline 02/24/2018 - Pt is independent with oral care and washing his face and occasionally requires increased time if he is having a bad day. Pt's shaving is completed by his barber.    Time 12    Period Weeks    Status Achieved      OT LONG TERM GOAL #3   Title Pt. will perform self-feeding skills with modified indepndence    Baseline 01/14/2018 -  Pt is able to complete self-feeding independently with minimal spillage. Pt is unable to cut meat at this time.    Time 12    Period Weeks    Status Achieved      OT LONG TERM GOAL #4   Title Pt. will perform self dressing with minA and A/E as needed.    Baseline Pt. continues to be able to donn a T shirt independently with increased time. MinA donning a jacket, Mod A donning pants over his feet, and socks, minA with shoes.    Time 12    Period Weeks    Status On-going      OT LONG TERM GOAL #5   Title Pt. will perform light home making tasks with minA    Baseline Pt is able to wash dishes while seated in w/c with increased accessibility of new apartment. Pt. is able to fold his clothes, his moms puts them in the and out of the washer, and dryer, and puts them onto hangers. Caregivers perform vacuuming, and dusting.    Time 12    Period Weeks    Status Achieved      OT LONG TERM GOAL #6   Title Pt. will write his name efficiently with 100% legibility    Baseline Pt. continues to present with 75% legibility with increased time required when printing his name, and 50% legibility when cursive writing his name to sign in when riding the [place]    Time 12    Period Weeks    Status On-going      OT LONG TERM GOAL #7   Title Pt will independently and consistently follow HEP to increase UE strength to increase functional independence    Baseline Pt. continues to work towards independence with exercises    Time 12    Period Weeks    Status On-going      OT LONG TERM GOAL #8   Title Pt. will require supervision ironing a shirt.    Baseline Pt. has progressed CGA, and complete set-up seated using a tabletop iron    Time 12    Period Weeks    Status On-going      OT LONG TERM GOAL  #9   Baseline Pt. will independently be able to sew a button onto a shirt.    Time 12    Period Weeks    Status On-going      OT LONG TERM GOAL  #10   TITLE Pt. will independently be able to throw a ball     Baseline Pt. continues to be limited with throwing overhand, however pt. is able to perform underhand throwing.    Time 12    Period Weeks    Status On-going  OT LONG TERM GOAL  #11   TITLE Pt. will improve UE functional reaching to be able to independently use his RUE to hand his clothes in the closest.    Baseline Pt. continues to improve with reaching up with the RUE, however has difficulty reaching the hanger rack in the closet. Pt. now uses a reacher.    Time 12    Period Weeks    Status On-going      OT LONG TERM GOAL  #12   TITLE Pt. will  be independent with shaving using an electric razer.    Baseline Pt. is able to use the electric razer independently, however is unable to access the position of the plug and requires assist form caregivers secondary to the size of the bathroom, and location of the plug.    Time 12    Period Weeks    Status Achieved      OT LONG TERM GOAL  #13   TITLE Pt. will increase BUE strength to be able to prepare for assisting with yardwork.    Baseline Pt. BUE strength 4+/5 Pt. reports that he has been able to get out to the front.    Time 12    Period Weeks    Status On-going      OT LONG TERM GOAL  #14   TITLE Pt. will improve activity to[place] in standing to be able to complete standing ADLs, and IADLs with SBA.    Baseline Pt. is limited with ADL performance in standing.    Time 12    Period Weeks    Status On-going                 Plan - 03/06/20 1307    Clinical Impression Statement Pt. is making progress, andcontinuesto improveBUEstrengthoverall.Pt.continues to progress towards goals.Pt. continues to work onBUE strength, andfunctional reaching.Pt. continues tomake progress overall.Pt.continues toimprovewith LUE strength, and overall [place] skills.Pt.requires verbal, and tactile cues forthe properthere. Ex.technique.Pt. continues to present with limited BUE strength, and [facility] skills. Pt.continues to work on  improving UE strength, and [facility] skills in order to work towards maximizing independence with ADLs, and IADLtasks.   OT Occupational Profile and History Problem Focused Assessment - Including review of records relating to presenting problem    Occupational performance deficits (Please refer to evaluation for details): ADL's;IADL's    Body Structure / Function / Physical Skills ADL;FMC;ROM;IADL;Pain;Coordination    Rehab Potential Fair    Clinical Decision Making Several treatment options, min-mod task modification necessary    Comorbidities Affecting Occupational Performance: May have comorbidities impacting occupational performance    Modification or Assistance to Complete Evaluation  Min-Moderate modification of tasks or assist with assess necessary to complete eval    OT Frequency 2x / week    OT Duration 12 weeks    OT Treatment/Interventions Self-care/ADL training;Patient/family education;DME and/or AE instruction;Therapeutic exercise;Neuromuscular education    Consulted and Agree with Plan of Care Patient           Patient will benefit from skilled therapeutic intervention in order to improve the following deficits and impairments:   Body Structure / Function / Physical Skills: ADL, FMC, ROM, IADL, Pain, Coordination       Visit Diagnosis: Muscle weakness (generalized)  Other lack of coordination    Problem List Patient Active Problem List   Diagnosis Date Noted  . Acute CVA (cerebrovascular accident) ([place]) 05/02/2016  . Left-sided weakness 05/01/2016    Harrel Carina, MS, OTR/L 03/06/2020, 1:09 PM  [place] MAIN [facility] SERVICES [address] [place], [place], [address] Phone: [phone]   Fax:  [phone]  Name: LEW PROUT MRN: [id number] Date of Birth: 1965-11-02

## 2020-03-08 ENCOUNTER — Other Ambulatory Visit: Payer: Self-pay

## 2020-03-08 ENCOUNTER — Encounter: Payer: Self-pay | Admitting: Occupational Therapy

## 2020-03-08 ENCOUNTER — Ambulatory Visit: Payer: Medicare HMO

## 2020-03-08 ENCOUNTER — Ambulatory Visit: Payer: Medicare HMO | Attending: Family Medicine | Admitting: Occupational Therapy

## 2020-03-08 DIAGNOSIS — M6281 Muscle weakness (generalized): Secondary | ICD-10-CM

## 2020-03-08 DIAGNOSIS — R278 Other lack of coordination: Secondary | ICD-10-CM | POA: Insufficient documentation

## 2020-03-08 DIAGNOSIS — R531 Weakness: Secondary | ICD-10-CM | POA: Insufficient documentation

## 2020-03-08 DIAGNOSIS — R2689 Other abnormalities of gait and mobility: Secondary | ICD-10-CM

## 2020-03-08 DIAGNOSIS — I69351 Hemiplegia and hemiparesis following cerebral infarction affecting right dominant side: Secondary | ICD-10-CM | POA: Insufficient documentation

## 2020-03-08 NOTE — Therapy (Signed)
[place] MAIN [facility] SERVICES [address] [place], [place], [address] Phone: [phone]   Fax:  [phone]  Physical Therapy Treatment  Patient Details  Name: Curtis Powell MRN: [id number] Date of Birth: March 14, 1966 No data recorded  Encounter Date: 03/08/2020   PT End of Session - 03/08/20 1658    Visit Number [id number]    Number of Visits 253    Date for PT Re-Evaluation 04/10/20    Authorization Type 1/10 PN 03/01/2020    Authorization Time Period authorized 11/16/18-05/19/19    Authorization - Visit Number 8    Authorization - Number of Visits 10    PT Start Time 1761    PT Stop Time 1428    PT Time Calculation (min) 43 min    Equipment Utilized During Treatment Gait belt    Activity Tolerance Patient tolerated treatment well;No increased pain    Behavior During Therapy WFL for tasks assessed/performed           Past Medical History:  Diagnosis Date  . GERD (gastroesophageal reflux disease)   . Hyperlipidemia   . Hypertension   . Obesity   . Stroke [facility])     Past Surgical History:  Procedure Laterality Date  . BRAIN SURGERY      There were no vitals filed for this visit.   Subjective Assessment - 03/08/20 1656    Subjective Patient reports feeling better than he did last session and would like to attempt to walk further today.    Pertinent History  Patient is a pleasant 54 year old male who presents to physical therapy for weakness and immobility secondary to CVA.  Had a stroke in Feb 2018. He was previously fully independent, but this stroke caused severe residual deficits, mainly on the right side as well as speech, and now he is unable to walk or perform most of his ADLs on his own. Entire right side is very weak. He still has a little difficulty with speech but his swallowing is improved to baseline. His mom had to move in with him and now is his main caregiver.     Limitations Sitting;Lifting;Standing;Walking;House hold  activities    How long can you sit comfortably? 5 minutes    How long can you stand comfortably? 5 minutes    How long can you walk comfortably? 10 ft    Patient Stated Goals Walk without walker, walk further with walker, get some strength in back     Currently in Pain? No/denies    Pain Score 0-No pain           Treatment:  Patient ambulates150 ft withRW, CGA, andWheelchair follow for safety. 1 rest break.  Patient requires min verbal cues head/neck posture when walking  Static stand: no UE support 30 seconds  Stand with RW in front and chair behind: -shadow box 2x30 seconds; occasional posterior LOB, improved ability to regain Com without UE support  Seated rows with postural cueing green TB x10 Seated due to increasing R foot inversion with standing: GTB hamstring curls 15x RTB 4 way strengthening 10x each LE  Pt educated throughout session about proper posture and technique with exercises. Improved exercise technique, movement at target joints, use of target muscles after min to mod verbal, visual, tactile cues.                           PT Education - 03/08/20 1658  Education Details posture, exercise technique    Person(s) Educated Patient    Methods Explanation    Comprehension Verbalized understanding            PT Short Term Goals - 01/24/20 1711      PT SHORT TERM GOAL #1   Title Patient will be independent in home exercise program to improve strength/mobility for better functional independence with ADLs.    Baseline hep compliant    Time 2    Period Weeks    Status Achieved      PT SHORT TERM GOAL #2   Title Patient will require min cueing for STS transfer with CGA for increased independence with mobility.     Baseline CGA    Time 2    Period Weeks    Status Achieved      PT SHORT TERM GOAL #3   Title Patient will maintain upright posture for > 15 seconds to demonstrate strengthened postural control muscualture      Baseline 18 seconds     Time 2    Period Weeks    Status Achieved             PT Long Term Goals - 03/01/20 0001      PT LONG TERM GOAL #1   Title Patient will perform bed mobility (rolling and supine <>sit) with Mod I  to increase mobility and  perform ADLs.     Baseline 9/13: able to perform with mod I occasional Min A when very fatigued 7/14: Min A 4/21: unable to perform 6/15: improving but still min-mod A  11/24: min-modA, difficulty breathing with changing positions    Time 12    Period Weeks    Status Partially Met    Target Date 04/10/20      PT LONG TERM GOAL #2   Title Patient (< 72 years old) will complete five times sit to stand test in < 10 seconds indicating an increased LE strength and improved balance.    Baseline  9/13: 14 seconds with one near LOB 7/14: 14.31 seconds 6/21: 16.86 seconds 4/21: 18.8 seconds 06/02/19: 19.93 seconds one hand on RW, one hand on w/c 12/30: 16 secpmds BUE support 02/11/19: 36 seconds 5/19: 20.6s from [facility], one hand on walker and one on WC, 80% upright posture 7/1: 22 seconds one hand on walker one hand on w/c  9/14: 48 seconds with full upright posture 11/24: 25 seconds with full upright posture, one hand on 2WW    Time 12    Period Weeks    Status Partially Met    Target Date 04/10/20      PT LONG TERM GOAL #3   Title Patient will negotiate a bathroom with a walker and Min A for increased indep in home and external environment    Baseline  9/13: able to some in standing, requires sitting down to wash hands 7/14: unable to do  11/24: able to complete voiding in standing and requires sitting down to wash hands    Time 12    Period Weeks    Status Partially Met    Target Date 04/10/20      PT LONG TERM GOAL #4   Title Patient will increase BLE gross strength to 4+/5 as to improve functional strength for independent gait, increased standing tolerance and increased ADL ability.    Baseline  4/21: see note 06/02/19: hip flexion 4-/5,  extension/abd 3/5 12/30: hip flexion 4/5 hip extension and abduction 3/5  02/11/19: hip flexion 4/5 hip extension and abduction 2+/54-/5 RLE, 10/15: 4/5 RLE 11/7: 4/5 RLE 3/3: 4/5 7/1:  hip flexion 4/5 hip extension 2/5 hip abduction/adduciton 2/5     Time 12    Period Weeks    Status Partially Met    Target Date 04/10/20      PT LONG TERM GOAL #6   Title Patient will ambulate 200 ft with least assistive device and Supervision with no breaks to allow for increased mobility within home.    Baseline 9/13: performed in previous session 7/14: 190 ft with RW 4/21: ambulate 150 ft 6/21: had to terminate after 90 ft due to cardiac  11/24: level surface with 2WW for 65 ft, limited due to fatigue and not feeling good today    Time 12    Period Weeks    Status Achieved    Target Date 04/11/19      PT LONG TERM GOAL #7   Title Patient will perform 10 MWT in <30 seconds for improved gait speed, gait mechanics, and functional capacity for mobility.     Baseline 10/11: 48s with BRW and WC follow; 9/13: terminated due to wardrobe malfunction 9/8: 42 seconds with R foot collapse 7/14: 37.98 seconds 6/21: 46.9 seconds with RW 07/28/19: 51 seconds with RW 06/02/19: 62.5 seconds with RW 11/4: 56 seconds with AFO and RW 12/30: 53 seconds with AFO and RW     Time 12    Period Weeks    Status Partially Met    Target Date 04/10/20      PT LONG TERM GOAL #8   Title Patient will increase Berg Balance score by > 6 points (38/56) to demonstrate decreased fall risk during functional activities.    Baseline 9/13: 31/56 7/14: 33/56 07/28/19: 32/56 6/21: 32/56     Time 12    Period Weeks    Status Partially Met    Target Date 04/10/20                 Plan - 03/08/20 1700    Clinical Impression Statement The patient was able to ambulate with rolling walker with close CGA approx 120f with one short rest break.  The patient's ambulating is mainly limited due to right sided weakness and decreased endurance.  The  patient when fatigued had increased difficulty clearing right foot.  The patient continues ot benefit from additional skilled PT intervention to improve right LE strength and improve overall endurance for improved walking tolerance and to decrease fall risk.    Rehab Potential Poor    Clinical Impairments Affecting Rehab Potential hx of HTN, HLD, CVA, learning disability, Diabetes, brain tumor,     PT Frequency 2x / week    PT Duration 12 weeks    PT Treatment/Interventions ADLs/Self Care Home Management;Aquatic Therapy;Ultrasound;Moist Heat;Traction;DME Instruction;Gait training;Stair training;Functional mobility training;Therapeutic activities;Therapeutic exercise;Orthotic Fit/Training;Neuromuscular re-education;Balance training;Patient/family education;Manual techniques;Wheelchair mobility training;Passive range of motion;Energy conservation;Taping;Visual/perceptual remediation/compensation    PT Next Visit Plan progress standing balance    PT Home Exercise Plan TrA contraction, glute sets: needs updates. Potentially some time in supine flat in hospital bed, to improve hip extension deficits.     Consulted and Agree with Plan of Care Patient    Family Member Consulted mother           Patient will benefit from skilled therapeutic intervention in order to improve the following deficits and impairments:  Abnormal gait, Decreased activity tolerance, Decreased balance, Decreased knowledge of precautions, Decreased endurance, Decreased  coordination, Decreased knowledge of use of DME, Decreased mobility, Decreased range of motion, Difficulty walking, Decreased safety awareness, Decreased strength, Impaired flexibility, Impaired perceived functional ability, Impaired tone, Postural dysfunction, Improper body mechanics, Pain  Visit Diagnosis: Other lack of coordination  Other abnormalities of gait and mobility  Left-sided weakness  Muscle weakness (generalized)     Problem List Patient  Active Problem List   Diagnosis Date Noted  . Acute CVA (cerebrovascular accident) ([place]) 05/02/2016  . Left-sided weakness 05/01/2016   Hal Morales, DPT  03/08/2020, 5:34 PM  [place] MAIN [facility] SERVICES [address] [place], [place], [address] Phone: [phone]   Fax:  [phone]  Name: LUISMIGUEL LAMERE MRN: [id number] Date of Birth: 1965-10-26

## 2020-03-08 NOTE — Therapy (Signed)
[place] MAIN [facility] SERVICES [address] [place], [place], [address] Phone: [phone]   Fax:  [phone]  Occupational Therapy Treatment  Patient Details  Name: Curtis Powell MRN: [id number] Date of Birth: 1966/03/24 No data recorded  Encounter Date: 03/08/2020   OT End of Session - 03/08/20 1303    Visit Number [id number]    Number of Visits 258    Date for OT Re-Evaluation 04/03/20    OT Start Time 1300    OT Stop Time 1345    OT Time Calculation (min) 45 min    Activity Tolerance Patient tolerated treatment well    Behavior During Therapy [facility] for tasks assessed/performed           Past Medical History:  Diagnosis Date  . GERD (gastroesophageal reflux disease)   . Hyperlipidemia   . Hypertension   . Obesity   . Stroke [facility])     Past Surgical History:  Procedure Laterality Date  . BRAIN SURGERY      There were no vitals filed for this visit.   Subjective Assessment - 03/08/20 1302    Subjective  Pt. reports that he is feeling good today    Patient is accompanied by: Family member    Pertinent History Pt. is a 54 y.o. male who suffered a CVA on 05/01/2016. Pt. was admitted to the hospital. Once discharged, he received Home Health PT and OT services for about a month. Pt. has had multiple CVAs over the past 8 years, and has had multiple falls in the past 6 months. Pt. resides in an apartment. Pt. has caregivers for 80 hours. Pt.'s mother stays with pt. at night, and assists with IADL tasks.    Patient Stated Goals To be able to throw a ball and dribble a ball, do as much as I can for myself.     Currently in Pain? No/denies          OT TREATMENT   Therapeutic Exercise:  Pt. performed4# dowel ex.for UE strengthening secondary to weakness. Bilateral shoulder flexion, chest press,circular, andpatterns were performedfor 1-2sets20reps each. 2# dumbbell weights for shoulder flexion 3#, and air punches. dumbbell ex. for  elbow flexion and extension,3 #forearm supination/pronation,andwrist flexion/extension, radial deviation for1 set 20 reps each.Pt. requires rest breaks and verbal cues for proper technique.Pt. worked on [place] seated for 8 min with moderate resistanceforward, and reverse.  Neuromuscular re-education:  Pt. worked on Tax adviser styles. Pt. Presented with multiple deviations while copying the curved lines. Pt. Was able to maintain a mature grasp on the pen throughout the duration of the task.  Pt. is making progress, andcontinuesto improveBUEstrengthoverall.Pt.continues to progress towards goals.Pt. continues to work onBUE strength, andfunctional reaching.Pt. continues tomake progress overall.Pt.continues toimprovewith LUE strength, and overall [place] skills.Pt.requires verbal, and tactile cues forthe properthere. Ex.technique.Pt. continues to present with limited BUE strength, and [facility] skills. Pt.continues to work on improving UE strength, and [facility] skills in order to work towards maximizing independence with ADLs, and IADLtas ks.                         OT Education - 03/08/20 1303    Education provided Yes    Person(s) Educated Patient    Methods Explanation;Demonstration    Comprehension Verbalized understanding;Returned demonstration;Verbal cues required            OT Short Term Goals - 03/03/17 1459  OT SHORT TERM GOAL #1   Title `             OT Long Term Goals - 02/16/20 1408      OT LONG TERM GOAL #1   Title Pt. will UE functioning to be able to perfrom toilet hygiene with minimal assistance.    Baseline 01/14/2018 - Pt completes toileting hygiene independently. Pt requires assistance managing LB clothing.    Time 12    Period Weeks    Status Achieved      OT LONG TERM GOAL #2   Title Pt. will complete self grooming with minA    Baseline 02/24/2018 - Pt is independent with oral care and  washing his face and occasionally requires increased time if he is having a bad day. Pt's shaving is completed by his barber.    Time 12    Period Weeks    Status Achieved      OT LONG TERM GOAL #3   Title Pt. will perform self-feeding skills with modified indepndence    Baseline 01/14/2018 - Pt is able to complete self-feeding independently with minimal spillage. Pt is unable to cut meat at this time.    Time 12    Period Weeks    Status Achieved      OT LONG TERM GOAL #4   Title Pt. will perform self dressing with minA and A/E as needed.    Baseline Pt. continues to be able to donn a T shirt independently with increased time. MinA donning a jacket, Mod A donning pants over his feet, and socks, minA with shoes.    Time 12    Period Weeks    Status On-going      OT LONG TERM GOAL #5   Title Pt. will perform light home making tasks with minA    Baseline Pt is able to wash dishes while seated in w/c with increased accessibility of new apartment. Pt. is able to fold his clothes, his moms puts them in the and out of the washer, and dryer, and puts them onto hangers. Caregivers perform vacuuming, and dusting.    Time 12    Period Weeks    Status Achieved      OT LONG TERM GOAL #6   Title Pt. will write his name efficiently with 100% legibility    Baseline Pt. continues to present with 75% legibility with increased time required when printing his name, and 50% legibility when cursive writing his name to sign in when riding the [place]    Time 12    Period Weeks    Status On-going      OT LONG TERM GOAL #7   Title Pt will independently and consistently follow HEP to increase UE strength to increase functional independence    Baseline Pt. continues to work towards independence with exercises    Time 12    Period Weeks    Status On-going      OT LONG TERM GOAL #8   Title Pt. will require supervision ironing a shirt.    Baseline Pt. has progressed CGA, and complete set-up seated using a  tabletop iron    Time 12    Period Weeks    Status On-going      OT LONG TERM GOAL  #9   Baseline Pt. will independently be able to sew a button onto a shirt.    Time 12    Period Weeks    Status On-going  OT LONG TERM GOAL  #10   TITLE Pt. will independently be able to throw a ball    Baseline Pt. continues to be limited with throwing overhand, however pt. is able to perform underhand throwing.    Time 12    Period Weeks    Status On-going      OT LONG TERM GOAL  #11   TITLE Pt. will improve UE functional reaching to be able to independently use his RUE to hand his clothes in the closest.    Baseline Pt. continues to improve with reaching up with the RUE, however has difficulty reaching the hanger rack in the closet. Pt. now uses a reacher.    Time 12    Period Weeks    Status On-going      OT LONG TERM GOAL  #12   TITLE Pt. will  be independent with shaving using an electric razer.    Baseline Pt. is able to use the electric razer independently, however is unable to access the position of the plug and requires assist form caregivers secondary to the size of the bathroom, and location of the plug.    Time 12    Period Weeks    Status Achieved      OT LONG TERM GOAL  #13   TITLE Pt. will increase BUE strength to be able to prepare for assisting with yardwork.    Baseline Pt. BUE strength 4+/5 Pt. reports that he has been able to get out to the front.    Time 12    Period Weeks    Status On-going      OT LONG TERM GOAL  #14   TITLE Pt. will improve activity toelrance in standing to be able to complete standing ADLs, and IADLs with SBA.    Baseline Pt. is limited with ADL performance in standing.    Time 12    Period Weeks    Status On-going                 Plan - 03/08/20 1304    Clinical Impression Statement Pt. is making progress, andcontinuesto improveBUEstrengthoverall.Pt.continues to progress towards goals.Pt. continues to work onBUE strength,  andfunctional reaching.Pt. continues tomake progress overall.Pt.continues toimprovewith LUE strength, and overall [place] skills.Pt.requires verbal, and tactile cues forthe properthere. Ex.technique.Pt. continues to present with limited BUE strength, and [facility] skills. Pt.continues to work on improving UE strength, and [facility] skills in order to work towards maximizing independence with ADLs, and IADLtasks.   OT Occupational Profile and History Problem Focused Assessment - Including review of records relating to presenting problem    Occupational performance deficits (Please refer to evaluation for details): ADL's;IADL's    Body Structure / Function / Physical Skills ADL;FMC;ROM;IADL;Pain;Coordination    Rehab Potential Fair    Clinical Decision Making Several treatment options, min-mod task modification necessary    Comorbidities Affecting Occupational Performance: May have comorbidities impacting occupational performance    Modification or Assistance to Complete Evaluation  Min-Moderate modification of tasks or assist with assess necessary to complete eval    OT Frequency 2x / week    OT Duration 12 weeks    OT Treatment/Interventions Self-care/ADL training;Patient/family education;DME and/or AE instruction;Therapeutic exercise;Neuromuscular education    Consulted and Agree with Plan of Care Patient           Patient will benefit from skilled therapeutic intervention in order to improve the following deficits and impairments:   Body Structure / Function / Physical Skills: ADL,  FMC, ROM, IADL, Pain, Coordination       Visit Diagnosis: Muscle weakness (generalized)  Other lack of coordination    Problem List Patient Active Problem List   Diagnosis Date Noted  . Acute CVA (cerebrovascular accident) ([place]) 05/02/2016  . Left-sided weakness 05/01/2016    Harrel Carina, MS, OTR/L 03/08/2020, 1:10 PM  [place] MAIN [facility] SERVICES [address] [place], [place], [address] Phone: [phone]   Fax:  [phone]  Name: DANNEL RAFTER MRN: [id number] Date of Birth: 1965-06-06

## 2020-03-13 ENCOUNTER — Ambulatory Visit: Payer: Medicare HMO | Admitting: Occupational Therapy

## 2020-03-13 ENCOUNTER — Ambulatory Visit: Payer: Medicare HMO

## 2020-03-13 ENCOUNTER — Encounter: Payer: Self-pay | Admitting: Occupational Therapy

## 2020-03-13 ENCOUNTER — Other Ambulatory Visit: Payer: Self-pay

## 2020-03-13 DIAGNOSIS — R278 Other lack of coordination: Secondary | ICD-10-CM

## 2020-03-13 DIAGNOSIS — M6281 Muscle weakness (generalized): Secondary | ICD-10-CM | POA: Diagnosis not present

## 2020-03-13 DIAGNOSIS — R2689 Other abnormalities of gait and mobility: Secondary | ICD-10-CM

## 2020-03-13 NOTE — Therapy (Signed)
[place] MAIN [facility] SERVICES [address] [place], [place], [address] Phone: [phone]   Fax:  [phone]  Occupational Therapy Treatment  Patient Details  Name: Curtis Powell MRN: [id number] Date of Birth: 02-03-1966 No data recorded  Encounter Date: 03/13/2020   OT End of Session - 03/13/20 1435    Visit Number [id number]    Number of Visits 258    Date for OT Re-Evaluation 04/03/20    Authorization Type Progress reporting period starting 09/15/2019    Activity Tolerance Patient tolerated treatment well    Behavior During Therapy [facility] for tasks assessed/performed           Past Medical History:  Diagnosis Date  . GERD (gastroesophageal reflux disease)   . Hyperlipidemia   . Hypertension   . Obesity   . Stroke [facility])     Past Surgical History:  Procedure Laterality Date  . BRAIN SURGERY      There were no vitals filed for this visit.   Subjective Assessment - 03/13/20 1434    Subjective  Pt. reports that he is feeling good today    Patient is accompanied by: Family member    Pertinent History Pt. is a 54 y.o. male who suffered a CVA on 05/01/2016. Pt. was admitted to the hospital. Once discharged, he received Home Health PT and OT services for about a month. Pt. has had multiple CVAs over the past 8 years, and has had multiple falls in the past 6 months. Pt. resides in an apartment. Pt. has caregivers for 80 hours. Pt.'s mother stays with pt. at night, and assists with IADL tasks.    Currently in Pain? No/denies           OT TREATMENT   Therapeutic Exercise:  Pt. performed4# dowel ex.for UE strengthening secondary to weakness. Bilateral shoulder flexion, chest press,circular, andpatterns were performedfor 1-2sets20reps.Pt. worked on 3# dumbbell ex. for elbow flexion and extension,3#forearm supination/pronation,andwrist flexion/extension, radial deviation for1 set 20 reps each.Pt. requires rest breaks and verbal  cues for proper technique.Pt. worked on [place] seated for 8 min. with moderate resistanceforward, and reverse.  Pt. Continues to make progress, andcontinuesto improveBUEstrengthoverall.Pt.continues to progress towards goals.Pt. continues to work onBUE strength, andfunctional reaching.Pt. continues tomake progress overall.Pt.continues toimprovewith LUE strength, and overall [place] skills.Pt.requires verbal, and tactile cues forthe properthere. Ex.technique.Pt. continues to present with limited BUE strength, and [facility] skills. Pt.continues to work on improving UE strength, and [facility] skills in order to work towards maximizing independence with ADLs, and IADLtasks.                       OT Education - 03/13/20 1435    Education provided Yes    Person(s) Educated Patient    Methods Explanation;Demonstration    Comprehension Verbalized understanding;Returned demonstration;Verbal cues required            OT Short Term Goals - 03/03/17 1459      OT SHORT TERM GOAL #1   Title `             OT Long Term Goals - 02/16/20 1408      OT LONG TERM GOAL #1   Title Pt. will UE functioning to be able to perfrom toilet hygiene with minimal assistance.    Baseline 01/14/2018 - Pt completes toileting hygiene independently. Pt requires assistance managing LB clothing.    Time 12    Period Weeks    Status Achieved  OT LONG TERM GOAL #2   Title Pt. will complete self grooming with minA    Baseline 02/24/2018 - Pt is independent with oral care and washing his face and occasionally requires increased time if he is having a bad day. Pt's shaving is completed by his barber.    Time 12    Period Weeks    Status Achieved      OT LONG TERM GOAL #3   Title Pt. will perform self-feeding skills with modified indepndence    Baseline 01/14/2018 - Pt is able to complete self-feeding independently with minimal spillage. Pt is unable to cut meat at this time.    Time 12     Period Weeks    Status Achieved      OT LONG TERM GOAL #4   Title Pt. will perform self dressing with minA and A/E as needed.    Baseline Pt. continues to be able to donn a T shirt independently with increased time. MinA donning a jacket, Mod A donning pants over his feet, and socks, minA with shoes.    Time 12    Period Weeks    Status On-going      OT LONG TERM GOAL #5   Title Pt. will perform light home making tasks with minA    Baseline Pt is able to wash dishes while seated in w/c with increased accessibility of new apartment. Pt. is able to fold his clothes, his moms puts them in the and out of the washer, and dryer, and puts them onto hangers. Caregivers perform vacuuming, and dusting.    Time 12    Period Weeks    Status Achieved      OT LONG TERM GOAL #6   Title Pt. will write his name efficiently with 100% legibility    Baseline Pt. continues to present with 75% legibility with increased time required when printing his name, and 50% legibility when cursive writing his name to sign in when riding the [place]    Time 12    Period Weeks    Status On-going      OT LONG TERM GOAL #7   Title Pt will independently and consistently follow HEP to increase UE strength to increase functional independence    Baseline Pt. continues to work towards independence with exercises    Time 12    Period Weeks    Status On-going      OT LONG TERM GOAL #8   Title Pt. will require supervision ironing a shirt.    Baseline Pt. has progressed CGA, and complete set-up seated using a tabletop iron    Time 12    Period Weeks    Status On-going      OT LONG TERM GOAL  #9   Baseline Pt. will independently be able to sew a button onto a shirt.    Time 12    Period Weeks    Status On-going      OT LONG TERM GOAL  #10   TITLE Pt. will independently be able to throw a ball    Baseline Pt. continues to be limited with throwing overhand, however pt. is able to perform underhand throwing.    Time  12    Period Weeks    Status On-going      OT LONG TERM GOAL  #11   TITLE Pt. will improve UE functional reaching to be able to independently use his RUE to hand his clothes in the closest.  Baseline Pt. continues to improve with reaching up with the RUE, however has difficulty reaching the hanger rack in the closet. Pt. now uses a reacher.    Time 12    Period Weeks    Status On-going      OT LONG TERM GOAL  #12   TITLE Pt. will  be independent with shaving using an electric razer.    Baseline Pt. is able to use the electric razer independently, however is unable to access the position of the plug and requires assist form caregivers secondary to the size of the bathroom, and location of the plug.    Time 12    Period Weeks    Status Achieved      OT LONG TERM GOAL  #13   TITLE Pt. will increase BUE strength to be able to prepare for assisting with yardwork.    Baseline Pt. BUE strength 4+/5 Pt. reports that he has been able to get out to the front.    Time 12    Period Weeks    Status On-going      OT LONG TERM GOAL  #14   TITLE Pt. will improve activity toelrance in standing to be able to complete standing ADLs, and IADLs with SBA.    Baseline Pt. is limited with ADL performance in standing.    Time 12    Period Weeks    Status On-going                 Plan - 03/13/20 1435    Clinical Impression Statement Pt. Continues to make progress, andcontinuesto improveBUEstrengthoverall.Pt.continues to progress towards goals.Pt. continues to work onBUE strength, andfunctional reaching.Pt. continues tomake progress overall.Pt.continues toimprovewith LUE strength, and overall [place] skills.Pt.requires verbal, and tactile cues forthe properthere. Ex.technique.Pt. continues to present with limited BUE strength, and [facility] skills. Pt.continues to work on improving UE strength, and [facility] skills in order to work towards maximizing independence with ADLs, and IADLtasks.    OT Occupational Profile and History Problem Focused Assessment - Including review of records relating to presenting problem    Occupational performance deficits (Please refer to evaluation for details): ADL's;IADL's    Body Structure / Function / Physical Skills ADL;FMC;ROM;IADL;Pain;Coordination    Rehab Potential Fair    Clinical Decision Making Several treatment options, min-mod task modification necessary    Comorbidities Affecting Occupational Performance: May have comorbidities impacting occupational performance    Modification or Assistance to Complete Evaluation  Min-Moderate modification of tasks or assist with assess necessary to complete eval    OT Frequency 2x / week    OT Duration 12 weeks    OT Treatment/Interventions Self-care/ADL training;Patient/family education;DME and/or AE instruction;Therapeutic exercise;Neuromuscular education    Consulted and Agree with Plan of Care Patient           Patient will benefit from skilled therapeutic intervention in order to improve the following deficits and impairments:   Body Structure / Function / Physical Skills: ADL, FMC, ROM, IADL, Pain, Coordination       Visit Diagnosis: Muscle weakness (generalized)  Other lack of coordination    Problem List Patient Active Problem List   Diagnosis Date Noted  . Acute CVA (cerebrovascular accident) ([place]) 05/02/2016  . Left-sided weakness 05/01/2016    Harrel Carina, MS, OTR/L 03/13/2020, 2:45 PM  [place] MAIN [facility] SERVICES [address] [place], [place], [address] Phone: [phone]   Fax:  [phone]  Name: TANVIR HIPPLE MRN: [id number] Date  of Birth: 1966-01-28

## 2020-03-13 NOTE — Therapy (Signed)
[place] MAIN [facility] SERVICES [address] [place], [place], [address] Phone: [phone]   Fax:  [phone]  Physical Therapy Treatment  Patient Details  Name: Curtis Powell MRN: [id number] Date of Birth: [birth year] No data recorded  Encounter Date: 03/13/2020   PT End of Session - 03/13/20 1354    Visit Number [id number]    Number of Visits 277    Date for PT Re-Evaluation 04/10/20    Authorization Type 3/10 PN 03/01/2020    Authorization Time Period authorized 11/16/18-05/19/19    PT Start Time 1345    PT Stop Time 1428    PT Time Calculation (min) 43 min    Equipment Utilized During Treatment Gait belt    Activity Tolerance Patient tolerated treatment well;No increased pain    Behavior During Therapy WFL for tasks assessed/performed           Past Medical History:  Diagnosis Date  . GERD (gastroesophageal reflux disease)   . Hyperlipidemia   . Hypertension   . Obesity   . Stroke [facility])     Past Surgical History:  Procedure Laterality Date  . BRAIN SURGERY      There were no vitals filed for this visit.   Subjective Assessment - 03/13/20 1355    Subjective Patient reports he had a good weekend watching football. No falls or LOB since last session.    Pertinent History  Patient is a pleasant 54 year old male who presents to physical therapy for weakness and immobility secondary to CVA.  Had a stroke in Feb 2018. He was previously fully independent, but this stroke caused severe residual deficits, mainly on the right side as well as speech, and now he is unable to walk or perform most of his ADLs on his own. Entire right side is very weak. He still has a little difficulty with speech but his swallowing is improved to baseline. His mom had to move in with him and now is his main caregiver.     Limitations Sitting;Lifting;Standing;Walking;House hold activities    How long can you sit comfortably? 5 minutes    How long can you stand  comfortably? 5 minutes    How long can you walk comfortably? 10 ft    Patient Stated Goals Walk without walker, walk further with walker, get some strength in back     Currently in Pain? No/denies                    Treatment: Nustep Lvl 4 RPM> 60 for cardiovascular challenge, seat at 7   Gait: Patient ambulates120 ft withRW, CGA, andWheelchair follow for safety. Patient requires min verbal cues for proximity to walker throughout gait trialas well as upright posture and weight shift.   Seated:  balloon taps reaching inside/outside BOS without LOB x 4 minutes  sit to stand 10x with decreasing UE support 5lb ankle weight:  -LAQ 15x 3-5 second holds each LE,  -march with upright posture 15x each LE -ER/IR 15x each LE     new HEP/updated HEP: Access Code: [email or web address] URL: [email or web address] Date: 03/13/2020 Prepared by: Janna Arch  Exercises Supine Active Straight Leg Raise - 1 x daily - 7 x weekly - 2 sets - 10 reps - 5 hold Supine Lower Trunk Rotation - 1 x daily - 7 x weekly - 2 sets - 10 reps - 5 hold Supine March - 1 x daily - 7 x weekly - 2 sets - 10  reps - 5 hold Seated Long Arc Quad - 1 x daily - 7 x weekly - 2 sets - 15 reps - 5 hold Seated March - 1 x daily - 7 x weekly - 2 sets - 10 reps - 5 hold Seated Marching with Opposite Shoulder Flexion - 1 x daily - 7 x weekly - 2 sets - 10 reps - 5 hold Seated Gluteal Sets - 1 x daily - 7 x weekly - 2 sets - 10 reps - 5 hold Seated Hamstring Curls with Resistance - 1 x daily - 7 x weekly - 2 sets - 10 reps - 5 hold Seated Hip Abduction with Resistance - 1 x daily - 7 x weekly - 2 sets - 10 reps - 5 hold Seated Hip Adduction Isometrics with Ball - 1 x daily - 7 x weekly - 2 sets - 10 reps - 5 hold   Pt educated throughout session about proper posture and technique with exercises. Improved exercise technique, movement at target joints, use of target muscles after min to mod verbal,  visual, tactile cues.                 PT Education - 03/13/20 1356    Education provided Yes    Education Details exercise technique, body mechanics    Person(s) Educated Patient    Methods Explanation;Tactile cues;Demonstration;Verbal cues    Comprehension Verbalized understanding;Returned demonstration;Verbal cues required;Tactile cues required            PT Short Term Goals - 01/24/20 1711      PT SHORT TERM GOAL #1   Title Patient will be independent in home exercise program to improve strength/mobility for better functional independence with ADLs.    Baseline hep compliant    Time 2    Period Weeks    Status Achieved      PT SHORT TERM GOAL #2   Title Patient will require min cueing for STS transfer with CGA for increased independence with mobility.     Baseline CGA    Time 2    Period Weeks    Status Achieved      PT SHORT TERM GOAL #3   Title Patient will maintain upright posture for > 15 seconds to demonstrate strengthened postural control muscualture     Baseline 18 seconds     Time 2    Period Weeks    Status Achieved             PT Long Term Goals - 03/01/20 0001      PT LONG TERM GOAL #1   Title Patient will perform bed mobility (rolling and supine <>sit) with Mod I  to increase mobility and  perform ADLs.     Baseline 9/13: able to perform with mod I occasional Min A when very fatigued 7/14: Min A 4/21: unable to perform 6/15: improving but still min-mod A  11/24: min-modA, difficulty breathing with changing positions    Time 12    Period Weeks    Status Partially Met    Target Date 04/10/20      PT LONG TERM GOAL #2   Title Patient (< 32 years old) will complete five times sit to stand test in < 10 seconds indicating an increased LE strength and improved balance.    Baseline  9/13: 14 seconds with one near LOB 7/14: 14.31 seconds 6/21: 16.86 seconds 4/21: 18.8 seconds 06/02/19: 19.93 seconds one hand on RW, one hand on w/c 12/30:  16  secpmds BUE support 02/11/19: 36 seconds 5/19: 20.6s from [facility], one hand on walker and one on WC, 80% upright posture 7/1: 22 seconds one hand on walker one hand on w/c  9/14: 48 seconds with full upright posture 11/24: 25 seconds with full upright posture, one hand on 2WW    Time 12    Period Weeks    Status Partially Met    Target Date 04/10/20      PT LONG TERM GOAL #3   Title Patient will negotiate a bathroom with a walker and Min A for increased indep in home and external environment    Baseline  9/13: able to some in standing, requires sitting down to wash hands 7/14: unable to do  11/24: able to complete voiding in standing and requires sitting down to wash hands    Time 12    Period Weeks    Status Partially Met    Target Date 04/10/20      PT LONG TERM GOAL #4   Title Patient will increase BLE gross strength to 4+/5 as to improve functional strength for independent gait, increased standing tolerance and increased ADL ability.    Baseline  4/21: see note 06/02/19: hip flexion 4-/5, extension/abd 3/5 12/30: hip flexion 4/5 hip extension and abduction 3/5 02/11/19: hip flexion 4/5 hip extension and abduction 2+/54-/5 RLE, 10/15: 4/5 RLE 11/7: 4/5 RLE 3/3: 4/5 7/1:  hip flexion 4/5 hip extension 2/5 hip abduction/adduciton 2/5     Time 12    Period Weeks    Status Partially Met    Target Date 04/10/20      PT LONG TERM GOAL #6   Title Patient will ambulate 200 ft with least assistive device and Supervision with no breaks to allow for increased mobility within home.    Baseline 9/13: performed in previous session 7/14: 190 ft with RW 4/21: ambulate 150 ft 6/21: had to terminate after 90 ft due to cardiac  11/24: level surface with 2WW for 65 ft, limited due to fatigue and not feeling good today    Time 12    Period Weeks    Status Achieved    Target Date 04/11/19      PT LONG TERM GOAL #7   Title Patient will perform 10 MWT in <30 seconds for improved gait speed, gait mechanics, and  functional capacity for mobility.     Baseline 10/11: 48s with BRW and WC follow; 9/13: terminated due to wardrobe malfunction 9/8: 42 seconds with R foot collapse 7/14: 37.98 seconds 6/21: 46.9 seconds with RW 07/28/19: 51 seconds with RW 06/02/19: 62.5 seconds with RW 11/4: 56 seconds with AFO and RW 12/30: 53 seconds with AFO and RW     Time 12    Period Weeks    Status Partially Met    Target Date 04/10/20      PT LONG TERM GOAL #8   Title Patient will increase Berg Balance score by > 6 points (38/56) to demonstrate decreased fall risk during functional activities.    Baseline 9/13: 31/56 7/14: 33/56 07/28/19: 32/56 6/21: 32/56     Time 12    Period Weeks    Status Partially Met    Target Date 04/10/20                 Plan - 03/13/20 1423    Clinical Impression Statement Patient educated on and performed progression of HEP program as well as ambulation this session indicating improved  capacity of mobility with decreased fatigue with prolonged muscle recruitment. Patient continues to be highly motivated throughout session and eager for progressions. The patient continues to benefit from additional skilled PT intervention to improve right LE strength and improve overall endurance for improved walking tolerance and to decrease fall risk    Rehab Potential Poor    Clinical Impairments Affecting Rehab Potential hx of HTN, HLD, CVA, learning disability, Diabetes, brain tumor,     PT Frequency 2x / week    PT Duration 12 weeks    PT Treatment/Interventions ADLs/Self Care Home Management;Aquatic Therapy;Ultrasound;Moist Heat;Traction;DME Instruction;Gait training;Stair training;Functional mobility training;Therapeutic activities;Therapeutic exercise;Orthotic Fit/Training;Neuromuscular re-education;Balance training;Patient/family education;Manual techniques;Wheelchair mobility training;Passive range of motion;Energy conservation;Taping;Visual/perceptual remediation/compensation    PT Next  Visit Plan progress standing balance    PT Home Exercise Plan TrA contraction, glute sets: needs updates. Potentially some time in supine flat in hospital bed, to improve hip extension deficits.     Consulted and Agree with Plan of Care Patient    Family Member Consulted mother           Patient will benefit from skilled therapeutic intervention in order to improve the following deficits and impairments:  Abnormal gait, Decreased activity tolerance, Decreased balance, Decreased knowledge of precautions, Decreased endurance, Decreased coordination, Decreased knowledge of use of DME, Decreased mobility, Decreased range of motion, Difficulty walking, Decreased safety awareness, Decreased strength, Impaired flexibility, Impaired perceived functional ability, Impaired tone, Postural dysfunction, Improper body mechanics, Pain  Visit Diagnosis: Other lack of coordination  Other abnormalities of gait and mobility  Muscle weakness (generalized)     Problem List Patient Active Problem List   Diagnosis Date Noted  . Acute CVA (cerebrovascular accident) ([place]) 05/02/2016  . Left-sided weakness 05/01/2016   Janna Arch, PT, DPT   03/13/2020, 2:28 PM  [place] MAIN [facility] SERVICES [address] [place], [place], [address] Phone: [phone]   Fax:  [phone]  Name: Curtis Powell MRN: [id number] Date of Birth: 04/09/1965

## 2020-03-15 ENCOUNTER — Other Ambulatory Visit: Payer: Self-pay

## 2020-03-15 ENCOUNTER — Encounter: Payer: Self-pay | Admitting: Occupational Therapy

## 2020-03-15 ENCOUNTER — Ambulatory Visit: Payer: Medicare HMO

## 2020-03-15 ENCOUNTER — Ambulatory Visit: Payer: Medicare HMO | Admitting: Occupational Therapy

## 2020-03-15 DIAGNOSIS — M6281 Muscle weakness (generalized): Secondary | ICD-10-CM | POA: Diagnosis not present

## 2020-03-15 DIAGNOSIS — R2689 Other abnormalities of gait and mobility: Secondary | ICD-10-CM

## 2020-03-15 DIAGNOSIS — R278 Other lack of coordination: Secondary | ICD-10-CM

## 2020-03-15 NOTE — Therapy (Signed)
[place] MAIN [facility] SERVICES [address] [place], [place], [address] Phone: [phone]   Fax:  [phone]  Physical Therapy Treatment  Patient Details  Name: Curtis Powell MRN: [id number] Date of Birth: 08-18-1965 No data recorded  Encounter Date: 03/15/2020   PT End of Session - 03/15/20 1342    Visit Number 254    Number of Visits 277    Date for PT Re-Evaluation 04/10/20    Authorization Type 4/10 PN 03/01/2020    Authorization Time Period authorized 11/16/18-05/19/19    PT Start Time 1346    PT Stop Time 1428    PT Time Calculation (min) 42 min    Equipment Utilized During Treatment Gait belt    Activity Tolerance Patient tolerated treatment well;No increased pain    Behavior During Therapy WFL for tasks assessed/performed           Past Medical History:  Diagnosis Date  . GERD (gastroesophageal reflux disease)   . Hyperlipidemia   . Hypertension   . Obesity   . Stroke [facility])     Past Surgical History:  Procedure Laterality Date  . BRAIN SURGERY      There were no vitals filed for this visit.   Subjective Assessment - 03/15/20 1411    Subjective Patient reports compliance with seated HEP. No falls or LOB. Is waiting for his new aide to start.    Pertinent History  Patient is a pleasant 54 year old male who presents to physical therapy for weakness and immobility secondary to CVA.  Had a stroke in Feb 2018. He was previously fully independent, but this stroke caused severe residual deficits, mainly on the right side as well as speech, and now he is unable to walk or perform most of his ADLs on his own. Entire right side is very weak. He still has a little difficulty with speech but his swallowing is improved to baseline. His mom had to move in with him and now is his main caregiver.     Limitations Sitting;Lifting;Standing;Walking;House hold activities    How long can you sit comfortably? 5 minutes    How long can you  stand comfortably? 5 minutes    How long can you walk comfortably? 10 ft    Patient Stated Goals Walk without walker, walk further with walker, get some strength in back     Currently in Pain? No/denies             Treatment:    Gait: Patient ambulates 140 ft with RW, CGA, and Wheelchair follow for safety. Patient requires min verbal cues for proximity to walker throughout gait trial as well as upright posture and weight shift.  Improved gait mechanics and gait speed. (performed in less than 10 minutes)      Standing with RW: (wheelchair behind)  6" step toe taps 10x each LE, very challenging for patient, requires stabilization to RW performed.   Shadow box with close CGA, patient able to stabilize self and correct for LOB. X 2 minutes   Seated:    balloon taps reaching inside/outside BOS without LOB x 4 minutes Balloon LAQ for coordination, sequencing/timing of muscle contractions, and stabilization. 10x each LE   sit to stand 10x with decreasing UE support  RTB df 10x, pf 10x   5lb ankle weight:  -LAQ 15x 3-5 second holds each LE,  -march with upright posture 15x each LE    Vitals monitored throughout session with rest breaks  to return HR to therapeutic range.    Pt educated throughout session about proper posture and technique with exercises. Improved exercise technique, movement at target joints, use of target muscles after min to mod verbal, visual, tactile cues.                       PT Education - 03/15/20 1342    Education provided Yes    Education Details exercise technique, body mechanics    Person(s) Educated Patient    Methods Explanation;Demonstration;Tactile cues;Verbal cues    Comprehension Verbalized understanding;Returned demonstration;Verbal cues required;Tactile cues required            PT Short Term Goals - 01/24/20 1711      PT SHORT TERM GOAL #1   Title Patient will be independent in home exercise program to improve  strength/mobility for better functional independence with ADLs.    Baseline hep compliant    Time 2    Period Weeks    Status Achieved      PT SHORT TERM GOAL #2   Title Patient will require min cueing for STS transfer with CGA for increased independence with mobility.     Baseline CGA    Time 2    Period Weeks    Status Achieved      PT SHORT TERM GOAL #3   Title Patient will maintain upright posture for > 15 seconds to demonstrate strengthened postural control muscualture     Baseline 18 seconds     Time 2    Period Weeks    Status Achieved             PT Long Term Goals - 03/01/20 0001      PT LONG TERM GOAL #1   Title Patient will perform bed mobility (rolling and supine <>sit) with Mod I  to increase mobility and  perform ADLs.     Baseline 9/13: able to perform with mod I occasional Min A when very fatigued 7/14: Min A 4/21: unable to perform 6/15: improving but still min-mod A  11/24: min-modA, difficulty breathing with changing positions    Time 12    Period Weeks    Status Partially Met    Target Date 04/10/20      PT LONG TERM GOAL #2   Title Patient (< 30 years old) will complete five times sit to stand test in < 10 seconds indicating an increased LE strength and improved balance.    Baseline  9/13: 14 seconds with one near LOB 7/14: 14.31 seconds 6/21: 16.86 seconds 4/21: 18.8 seconds 06/02/19: 19.93 seconds one hand on RW, one hand on w/c 12/30: 16 secpmds BUE support 02/11/19: 36 seconds 5/19: 20.6s from [facility], one hand on walker and one on WC, 80% upright posture 7/1: 22 seconds one hand on walker one hand on w/c  9/14: 48 seconds with full upright posture 11/24: 25 seconds with full upright posture, one hand on 2WW    Time 12    Period Weeks    Status Partially Met    Target Date 04/10/20      PT LONG TERM GOAL #3   Title Patient will negotiate a bathroom with a walker and Min A for increased indep in home and external environment    Baseline  9/13: able to  some in standing, requires sitting down to wash hands 7/14: unable to do  11/24: able to complete voiding in standing and requires sitting down to  wash hands    Time 12    Period Weeks    Status Partially Met    Target Date 04/10/20      PT LONG TERM GOAL #4   Title Patient will increase BLE gross strength to 4+/5 as to improve functional strength for independent gait, increased standing tolerance and increased ADL ability.    Baseline  4/21: see note 06/02/19: hip flexion 4-/5, extension/abd 3/5 12/30: hip flexion 4/5 hip extension and abduction 3/5 02/11/19: hip flexion 4/5 hip extension and abduction 2+/54-/5 RLE, 10/15: 4/5 RLE 11/7: 4/5 RLE 3/3: 4/5 7/1:  hip flexion 4/5 hip extension 2/5 hip abduction/adduciton 2/5     Time 12    Period Weeks    Status Partially Met    Target Date 04/10/20      PT LONG TERM GOAL #6   Title Patient will ambulate 200 ft with least assistive device and Supervision with no breaks to allow for increased mobility within home.    Baseline 9/13: performed in previous session 7/14: 190 ft with RW 4/21: ambulate 150 ft 6/21: had to terminate after 90 ft due to cardiac  11/24: level surface with 2WW for 65 ft, limited due to fatigue and not feeling good today    Time 12    Period Weeks    Status Achieved    Target Date 04/11/19      PT LONG TERM GOAL #7   Title Patient will perform 10 MWT in <30 seconds for improved gait speed, gait mechanics, and functional capacity for mobility.     Baseline 10/11: 48s with BRW and WC follow; 9/13: terminated due to wardrobe malfunction 9/8: 42 seconds with R foot collapse 7/14: 37.98 seconds 6/21: 46.9 seconds with RW 07/28/19: 51 seconds with RW 06/02/19: 62.5 seconds with RW 11/4: 56 seconds with AFO and RW 12/30: 53 seconds with AFO and RW     Time 12    Period Weeks    Status Partially Met    Target Date 04/10/20      PT LONG TERM GOAL #8   Title Patient will increase Berg Balance score by > 6 points (38/56) to  demonstrate decreased fall risk during functional activities.    Baseline 9/13: 31/56 7/14: 33/56 07/28/19: 32/56 6/21: 32/56     Time 12    Period Weeks    Status Partially Met    Target Date 04/10/20                 Plan - 03/15/20 1410    Clinical Impression Statement Patient demonstrates excellent motivation throughout session. His gait speed is improving with increased distance performed in decreased time. Foot clearance is improving with improved hip flexion as noted with 6' step toe taps. The patient continues to benefit from additional skilled PT intervention to improve right LE strength and improve overall endurance for improved walking tolerance and to decrease fall risk    Rehab Potential Poor    Clinical Impairments Affecting Rehab Potential hx of HTN, HLD, CVA, learning disability, Diabetes, brain tumor,     PT Frequency 2x / week    PT Duration 12 weeks    PT Treatment/Interventions ADLs/Self Care Home Management;Aquatic Therapy;Ultrasound;Moist Heat;Traction;DME Instruction;Gait training;Stair training;Functional mobility training;Therapeutic activities;Therapeutic exercise;Orthotic Fit/Training;Neuromuscular re-education;Balance training;Patient/family education;Manual techniques;Wheelchair mobility training;Passive range of motion;Energy conservation;Taping;Visual/perceptual remediation/compensation    PT Next Visit Plan progress standing balance    PT Home Exercise Plan TrA contraction, glute sets: needs updates. Potentially some time in  supine flat in hospital bed, to improve hip extension deficits.     Consulted and Agree with Plan of Care Patient    Family Member Consulted mother           Patient will benefit from skilled therapeutic intervention in order to improve the following deficits and impairments:  Abnormal gait, Decreased activity tolerance, Decreased balance, Decreased knowledge of precautions, Decreased endurance, Decreased coordination, Decreased  knowledge of use of DME, Decreased mobility, Decreased range of motion, Difficulty walking, Decreased safety awareness, Decreased strength, Impaired flexibility, Impaired perceived functional ability, Impaired tone, Postural dysfunction, Improper body mechanics, Pain  Visit Diagnosis: Muscle weakness (generalized)  Other lack of coordination  Other abnormalities of gait and mobility     Problem List Patient Active Problem List   Diagnosis Date Noted  . Acute CVA (cerebrovascular accident) ([place]) 05/02/2016  . Left-sided weakness 05/01/2016   Janna Arch, PT, DPT   03/15/2020, 2:29 PM  [place] MAIN [facility] SERVICES [address] [place], [place], [address] Phone: [phone]   Fax:  [phone]  Name: Curtis Powell MRN: [id number] Date of Birth: 11-19-1965

## 2020-03-15 NOTE — Therapy (Signed)
[place] MAIN [facility] SERVICES [address] [place], [place], [address] Phone: [phone]   Fax:  [phone]  Occupational Therapy Treatment  Patient Details  Name: Curtis Powell MRN: [id number] Date of Birth: [birth year] No data recorded  Encounter Date: 03/15/2020   OT End of Session - 03/15/20 1320    Visit Number 238    Number of Visits 258    Date for OT Re-Evaluation 04/03/20    OT Start Time 1300    OT Stop Time 1345    OT Time Calculation (min) 45 min    Activity Tolerance Patient tolerated treatment well    Behavior During Therapy [facility] for tasks assessed/performed           Past Medical History:  Diagnosis Date  . GERD (gastroesophageal reflux disease)   . Hyperlipidemia   . Hypertension   . Obesity   . Stroke [facility])     Past Surgical History:  Procedure Laterality Date  . BRAIN SURGERY      There were no vitals filed for this visit.   Subjective Assessment - 03/15/20 1320    Subjective  Pt. reports that he is feeling good today    Patient is accompanied by: Family member    Pertinent History Pt. is a 54 y.o. male who suffered a CVA on 05/01/2016. Pt. was admitted to the hospital. Once discharged, he received Home Health PT and OT services for about a month. Pt. has had multiple CVAs over the past 8 years, and has had multiple falls in the past 6 months. Pt. resides in an apartment. Pt. has caregivers for 80 hours. Pt.'s mother stays with pt. at night, and assists with IADL tasks.    Currently in Pain? No/denies           OT TREATMENT   Therapeutic Exercise:  Pt. performed4# dowel ex.for UE strengthening secondary to weakness. Bilateral shoulder flexion, chest press,circular, andpatterns were performedfor 1-2sets20reps.Pt. worked on 3# dumbbell ex. for elbow flexion and extension,3#forearm supination/pronation,andwrist flexion/extension, radial deviation for1 set 20 reps each.Pt. requires rest  breaks and verbal cues for proper technique.Pt. worked on [place] seated for 8 min. with moderate resistanceforward, and reverse.  Pt. Continues to make progress, andcontinuesto improveBUEstrengthoverall.Pt.continues to progress towards goals.Pt. continues to work onBUE strength, andfunctional reaching.Pt. continues tomake progress overall.Pt.continues toimprovewith LUE strength, and overall [place] skills.Pt.requires verbal, and tactile cues forthe properthere. Ex.technique.Pt. continues to present with limited BUE strength, and [facility] skills. Pt.continues to work on improving UE strength, and [facility] skills in order to work towards maximizing independence with ADLs, and IADLtasks.                         OT Education - 03/15/20 1320    Education provided Yes    Person(s) Educated Patient    Methods Explanation;Demonstration    Comprehension Verbalized understanding;Returned demonstration;Verbal cues required            OT Short Term Goals - 03/03/17 1459      OT SHORT TERM GOAL #1   Title `             OT Long Term Goals - 02/16/20 1408      OT LONG TERM GOAL #1   Title Pt. will UE functioning to be able to perfrom toilet hygiene with minimal assistance.    Baseline 01/14/2018 - Pt completes toileting hygiene independently. Pt requires assistance managing LB clothing.  Time 12    Period Weeks    Status Achieved      OT LONG TERM GOAL #2   Title Pt. will complete self grooming with minA    Baseline 02/24/2018 - Pt is independent with oral care and washing his face and occasionally requires increased time if he is having a bad day. Pt's shaving is completed by his barber.    Time 12    Period Weeks    Status Achieved      OT LONG TERM GOAL #3   Title Pt. will perform self-feeding skills with modified indepndence    Baseline 01/14/2018 - Pt is able to complete self-feeding independently with minimal spillage. Pt is unable to cut meat at  this time.    Time 12    Period Weeks    Status Achieved      OT LONG TERM GOAL #4   Title Pt. will perform self dressing with minA and A/E as needed.    Baseline Pt. continues to be able to donn a T shirt independently with increased time. MinA donning a jacket, Mod A donning pants over his feet, and socks, minA with shoes.    Time 12    Period Weeks    Status On-going      OT LONG TERM GOAL #5   Title Pt. will perform light home making tasks with minA    Baseline Pt is able to wash dishes while seated in w/c with increased accessibility of new apartment. Pt. is able to fold his clothes, his moms puts them in the and out of the washer, and dryer, and puts them onto hangers. Caregivers perform vacuuming, and dusting.    Time 12    Period Weeks    Status Achieved      OT LONG TERM GOAL #6   Title Pt. will write his name efficiently with 100% legibility    Baseline Pt. continues to present with 75% legibility with increased time required when printing his name, and 50% legibility when cursive writing his name to sign in when riding the [place]    Time 12    Period Weeks    Status On-going      OT LONG TERM GOAL #7   Title Pt will independently and consistently follow HEP to increase UE strength to increase functional independence    Baseline Pt. continues to work towards independence with exercises    Time 12    Period Weeks    Status On-going      OT LONG TERM GOAL #8   Title Pt. will require supervision ironing a shirt.    Baseline Pt. has progressed CGA, and complete set-up seated using a tabletop iron    Time 12    Period Weeks    Status On-going      OT LONG TERM GOAL  #9   Baseline Pt. will independently be able to sew a button onto a shirt.    Time 12    Period Weeks    Status On-going      OT LONG TERM GOAL  #10   TITLE Pt. will independently be able to throw a ball    Baseline Pt. continues to be limited with throwing overhand, however pt. is able to perform  underhand throwing.    Time 12    Period Weeks    Status On-going      OT LONG TERM GOAL  #11   TITLE Pt. will improve UE functional  reaching to be able to independently use his RUE to hand his clothes in the closest.    Baseline Pt. continues to improve with reaching up with the RUE, however has difficulty reaching the hanger rack in the closet. Pt. now uses a reacher.    Time 12    Period Weeks    Status On-going      OT LONG TERM GOAL  #12   TITLE Pt. will  be independent with shaving using an electric razer.    Baseline Pt. is able to use the electric razer independently, however is unable to access the position of the plug and requires assist form caregivers secondary to the size of the bathroom, and location of the plug.    Time 12    Period Weeks    Status Achieved      OT LONG TERM GOAL  #13   TITLE Pt. will increase BUE strength to be able to prepare for assisting with yardwork.    Baseline Pt. BUE strength 4+/5 Pt. reports that he has been able to get out to the front.    Time 12    Period Weeks    Status On-going      OT LONG TERM GOAL  #14   TITLE Pt. will improve activity toelrance in standing to be able to complete standing ADLs, and IADLs with SBA.    Baseline Pt. is limited with ADL performance in standing.    Time 12    Period Weeks    Status On-going                 Plan - 03/15/20 1321    Clinical Impression Statement Pt. Continues to make progress, andcontinuesto improveBUEstrengthoverall.Pt.continues to progress towards goals.Pt. continues to work onBUE strength, andfunctional reaching.Pt. continues tomake progress overall.Pt.continues toimprovewith LUE strength, and overall [place] skills.Pt.requires verbal, and tactile cues forthe properthere. Ex.technique.Pt. continues to present with limited BUE strength, and [facility] skills. Pt.continues to work on improving UE strength, and [facility] skills in order to work towards maximizing independence  with ADLs, and IADLtasks.   OT Occupational Profile and History Problem Focused Assessment - Including review of records relating to presenting problem    Occupational performance deficits (Please refer to evaluation for details): ADL's;IADL's    Body Structure / Function / Physical Skills ADL;FMC;ROM;IADL;Pain;Coordination    Rehab Potential Fair    Clinical Decision Making Several treatment options, min-mod task modification necessary    Comorbidities Affecting Occupational Performance: May have comorbidities impacting occupational performance    Modification or Assistance to Complete Evaluation  Min-Moderate modification of tasks or assist with assess necessary to complete eval    OT Frequency 2x / week    OT Duration 12 weeks    OT Treatment/Interventions Self-care/ADL training;Patient/family education;DME and/or AE instruction;Therapeutic exercise;Neuromuscular education    Consulted and Agree with Plan of Care Patient           Patient will benefit from skilled therapeutic intervention in order to improve the following deficits and impairments:   Body Structure / Function / Physical Skills: ADL, FMC, ROM, IADL, Pain, Coordination       Visit Diagnosis: Muscle weakness (generalized)    Problem List Patient Active Problem List   Diagnosis Date Noted  . Acute CVA (cerebrovascular accident) ([place]) 05/02/2016  . Left-sided weakness 05/01/2016    Harrel Carina, MS, OTR/L 03/15/2020, 1:23 PM  [place] MAIN [facility] SERVICES [address] [place], [place], [address] Phone:  [phone]   Fax:  [phone]  Name: Curtis Powell MRN: [id number] Date of Birth: 03-Feb-1966

## 2020-03-20 ENCOUNTER — Ambulatory Visit: Payer: Medicare HMO | Admitting: Occupational Therapy

## 2020-03-20 ENCOUNTER — Ambulatory Visit: Payer: Medicare HMO

## 2020-03-20 ENCOUNTER — Encounter: Payer: Self-pay | Admitting: Occupational Therapy

## 2020-03-20 ENCOUNTER — Other Ambulatory Visit: Payer: Self-pay

## 2020-03-20 DIAGNOSIS — R278 Other lack of coordination: Secondary | ICD-10-CM

## 2020-03-20 DIAGNOSIS — M6281 Muscle weakness (generalized): Secondary | ICD-10-CM

## 2020-03-20 DIAGNOSIS — I69351 Hemiplegia and hemiparesis following cerebral infarction affecting right dominant side: Secondary | ICD-10-CM

## 2020-03-20 DIAGNOSIS — R531 Weakness: Secondary | ICD-10-CM

## 2020-03-20 DIAGNOSIS — R2689 Other abnormalities of gait and mobility: Secondary | ICD-10-CM

## 2020-03-20 NOTE — Therapy (Signed)
[place] MAIN [facility] SERVICES [address] [place], [place], [address] Phone: [phone]   Fax:  [phone]  Occupational Therapy Treatment  Patient Details  Name: Curtis Powell MRN: [id number] Date of Birth: [birth year] No data recorded  Encounter Date: 03/20/2020   OT End of Session - 03/20/20 1443    Visit Number 239    Number of Visits 258    Date for OT Re-Evaluation 04/03/20    Authorization Type Progress reporting period starting 09/15/2019    OT Start Time 1437    OT Stop Time 1515    OT Time Calculation (min) 38 min    Activity Tolerance Patient tolerated treatment well    Behavior During Therapy [facility] for tasks assessed/performed           Past Medical History:  Diagnosis Date  . GERD (gastroesophageal reflux disease)   . Hyperlipidemia   . Hypertension   . Obesity   . Stroke [facility])     Past Surgical History:  Procedure Laterality Date  . BRAIN SURGERY      There were no vitals filed for this visit.   Subjective Assessment - 03/20/20 1443    Patient is accompanied by: Family member    Pertinent History Pt. is a 54 y.o. male who suffered a CVA on 05/01/2016. Pt. was admitted to the hospital. Once discharged, he received Home Health PT and OT services for about a month. Pt. has had multiple CVAs over the past 8 years, and has had multiple falls in the past 6 months. Pt. resides in an apartment. Pt. has caregivers for 80 hours. Pt.'s mother stays with pt. at night, and assists with IADL tasks.    Currently in Pain? No/denies          Self-care:   Pt. worked on Art gallery manager, and copying titles of holiday songs.    Pt. presents with 75% legibility in printed form, formulating large printed letters. Pt. Requires cues for spelling. Pt. required increased time Pt. had  Pt. is making progress, andcontinuesto improveBUEstrengthoverall.Pt.continues to progress towards goals.Pt. continues to work onBUE  strength, andfunctional reaching.Pt. continues tomake progress overall.Pt.continues toimprovewith LUE strength, and overall [place] skills.Pt.requires verbal, and tactile cues forthe properthere. Ex.technique.Pt. continues to present with limited BUE strength, and [facility] skills. Pt.continues to work on improving UE strength, and [facility] skills in order to work towards maximizing independence with ADLs, and IADLtasks.                       OT Education - 03/20/20 1443    Education provided Yes    Education Details grip exercises, strengthening    Person(s) Educated Patient    Methods Explanation;Demonstration    Comprehension Verbalized understanding;Returned demonstration;Verbal cues required            OT Short Term Goals - 03/03/17 1459      OT SHORT TERM GOAL #1   Title `             OT Long Term Goals - 02/16/20 1408      OT LONG TERM GOAL #1   Title Pt. will UE functioning to be able to perfrom toilet hygiene with minimal assistance.    Baseline 01/14/2018 - Pt completes toileting hygiene independently. Pt requires assistance managing LB clothing.    Time 12    Period Weeks    Status Achieved      OT LONG TERM GOAL #2  Title Pt. will complete self grooming with minA    Baseline 02/24/2018 - Pt is independent with oral care and washing his face and occasionally requires increased time if he is having a bad day. Pt's shaving is completed by his barber.    Time 12    Period Weeks    Status Achieved      OT LONG TERM GOAL #3   Title Pt. will perform self-feeding skills with modified indepndence    Baseline 01/14/2018 - Pt is able to complete self-feeding independently with minimal spillage. Pt is unable to cut meat at this time.    Time 12    Period Weeks    Status Achieved      OT LONG TERM GOAL #4   Title Pt. will perform self dressing with minA and A/E as needed.    Baseline Pt. continues to be able to donn a T shirt independently with  increased time. MinA donning a jacket, Mod A donning pants over his feet, and socks, minA with shoes.    Time 12    Period Weeks    Status On-going      OT LONG TERM GOAL #5   Title Pt. will perform light home making tasks with minA    Baseline Pt is able to wash dishes while seated in w/c with increased accessibility of new apartment. Pt. is able to fold his clothes, his moms puts them in the and out of the washer, and dryer, and puts them onto hangers. Caregivers perform vacuuming, and dusting.    Time 12    Period Weeks    Status Achieved      OT LONG TERM GOAL #6   Title Pt. will write his name efficiently with 100% legibility    Baseline Pt. continues to present with 75% legibility with increased time required when printing his name, and 50% legibility when cursive writing his name to sign in when riding the [place]    Time 12    Period Weeks    Status On-going      OT LONG TERM GOAL #7   Title Pt will independently and consistently follow HEP to increase UE strength to increase functional independence    Baseline Pt. continues to work towards independence with exercises    Time 12    Period Weeks    Status On-going      OT LONG TERM GOAL #8   Title Pt. will require supervision ironing a shirt.    Baseline Pt. has progressed CGA, and complete set-up seated using a tabletop iron    Time 12    Period Weeks    Status On-going      OT LONG TERM GOAL  #9   Baseline Pt. will independently be able to sew a button onto a shirt.    Time 12    Period Weeks    Status On-going      OT LONG TERM GOAL  #10   TITLE Pt. will independently be able to throw a ball    Baseline Pt. continues to be limited with throwing overhand, however pt. is able to perform underhand throwing.    Time 12    Period Weeks    Status On-going      OT LONG TERM GOAL  #11   TITLE Pt. will improve UE functional reaching to be able to independently use his RUE to hand his clothes in the closest.    Baseline  Pt. continues to improve  with reaching up with the RUE, however has difficulty reaching the hanger rack in the closet. Pt. now uses a reacher.    Time 12    Period Weeks    Status On-going      OT LONG TERM GOAL  #12   TITLE Pt. will  be independent with shaving using an electric razer.    Baseline Pt. is able to use the electric razer independently, however is unable to access the position of the plug and requires assist form caregivers secondary to the size of the bathroom, and location of the plug.    Time 12    Period Weeks    Status Achieved      OT LONG TERM GOAL  #13   TITLE Pt. will increase BUE strength to be able to prepare for assisting with yardwork.    Baseline Pt. BUE strength 4+/5 Pt. reports that he has been able to get out to the front.    Time 12    Period Weeks    Status On-going      OT LONG TERM GOAL  #14   TITLE Pt. will improve activity toelrance in standing to be able to complete standing ADLs, and IADLs with SBA.    Baseline Pt. is limited with ADL performance in standing.    Time 12    Period Weeks    Status On-going                 Plan - 03/20/20 1444    Clinical Impression Statement Pt. presents with 75% legibility in printed form, formulating large printed letters. Pt. Requires cues for spelling. Pt. required increased time Pt. had  Pt. is making progress, andcontinuesto improveBUEstrengthoverall.Pt.continues to progress towards goals.Pt. continues to work onBUE strength, andfunctional reaching.Pt. continues tomake progress overall.Pt.continues toimprovewith LUE strength, and overall [place] skills.Pt.requires verbal, and tactile cues forthe properthere. Ex.technique.Pt. continues to present with limited BUE strength, and [facility] skills. Pt.continues to work on improving UE strength, and [facility] skills in order to work towards maximizing independence with ADLs, and IADLtasks.   Occupational performance deficits (Please refer to evaluation  for details): ADL's;IADL's    Body Structure / Function / Physical Skills ADL;FMC;ROM;IADL;Pain;Coordination    Rehab Potential Fair    Clinical Decision Making Several treatment options, min-mod task modification necessary    Comorbidities Affecting Occupational Performance: May have comorbidities impacting occupational performance    Modification or Assistance to Complete Evaluation  Min-Moderate modification of tasks or assist with assess necessary to complete eval    OT Frequency 2x / week    OT Duration 12 weeks    OT Treatment/Interventions Self-care/ADL training;Patient/family education;DME and/or AE instruction;Therapeutic exercise;Neuromuscular education    Consulted and Agree with Plan of Care Patient           Patient will benefit from skilled therapeutic intervention in order to improve the following deficits and impairments:   Body Structure / Function / Physical Skills: ADL,FMC,ROM,IADL,Pain,Coordination       Visit Diagnosis: Muscle weakness (generalized)  Other lack of coordination    Problem List Patient Active Problem List   Diagnosis Date Noted  . Acute CVA (cerebrovascular accident) ([place]) 05/02/2016  . Left-sided weakness 05/01/2016    Harrel Carina, MS, OTR/L 03/20/2020, 2:49 PM  [place] MAIN [facility] SERVICES [address] [place], [place], [address] Phone: [phone]   Fax:  [phone]  Name: Curtis Powell MRN: [id number] Date of Birth: [birth year]

## 2020-03-20 NOTE — Therapy (Signed)
[place] MAIN [facility] SERVICES [address] [place], [place], [address] Phone: [phone]   Fax:  [phone]  Physical Therapy Treatment  Patient Details  Name: Curtis Powell MRN: [id number] Date of Birth: 08/20/1965 No data recorded  Encounter Date: 03/20/2020   PT End of Session - 03/20/20 1402    Visit Number 255    Number of Visits 277    Date for PT Re-Evaluation 04/10/20    PT Start Time 4315   late start, pt in BR   PT Stop Time 1425    PT Time Calculation (min) 30 min    Activity Tolerance Patient tolerated treatment well;No increased pain    Behavior During Therapy WFL for tasks assessed/performed           Past Medical History:  Diagnosis Date  . GERD (gastroesophageal reflux disease)   . Hyperlipidemia   . Hypertension   . Obesity   . Stroke [facility])     Past Surgical History:  Procedure Laterality Date  . BRAIN SURGERY      There were no vitals filed for this visit.   Subjective Assessment - 03/20/20 1400    Subjective Pt doing well, started with his new caregiver. He says they are walking daily now. No other updates.    Pertinent History  Patient is a pleasant 54 year old male who presents to physical therapy for weakness and immobility secondary to CVA.  Had a stroke in Feb 2018. He was previously fully independent, but this stroke caused severe residual deficits, mainly on the right side as well as speech, and now he is unable to walk or perform most of his ADLs on his own. Entire right side is very weak. He still has a little difficulty with speech but his swallowing is improved to baseline. His mom had to move in with him and now is his main caregiver.     Limitations Sitting;Lifting;Standing;Walking;House hold activities    Currently in Pain? No/denies           INTERVENTION THIS DATE:  -Nustep: 5 minutes; 2x30sec all out effort at level 5 (90sec recovery on level 1 easy between)  Seat 7, Arms 11, level  2 -20f AMB overground with wide YRW -10x STS from elevated surface, pause in full upright standing neutral trunk x2-3sec each time -repeated extension in standing 10x3secH, plinth at PSIS level, BUE on RW for overpressures -Sustained full upright, minA support at PSIS hands free 2x30secH  -[id number] overground with wide YRW      PT Education - 03/20/20 1401    Education provided Yes    Education Details importance of general fitness for ADL safety/independence    Person(s) Educated Patient    Methods Explanation    Comprehension Verbalized understanding            PT Short Term Goals - 01/24/20 1711      PT SHORT TERM GOAL #1   Title Patient will be independent in home exercise program to improve strength/mobility for better functional independence with ADLs.    Baseline hep compliant    Time 2    Period Weeks    Status Achieved      PT SHORT TERM GOAL #2   Title Patient will require min cueing for STS transfer with CGA for increased independence with mobility.     Baseline CGA    Time 2    Period Weeks    Status Achieved  PT SHORT TERM GOAL #3   Title Patient will maintain upright posture for > 15 seconds to demonstrate strengthened postural control muscualture     Baseline 18 seconds     Time 2    Period Weeks    Status Achieved             PT Long Term Goals - 03/01/20 0001      PT LONG TERM GOAL #1   Title Patient will perform bed mobility (rolling and supine <>sit) with Mod I  to increase mobility and  perform ADLs.     Baseline 9/13: able to perform with mod I occasional Min A when very fatigued 7/14: Min A 4/21: unable to perform 6/15: improving but still min-mod A  11/24: min-modA, difficulty breathing with changing positions    Time 12    Period Weeks    Status Partially Met    Target Date 04/10/20      PT LONG TERM GOAL #2   Title Patient (< 54 years old) will complete five times sit to stand test in < 10 seconds indicating an increased LE  strength and improved balance.    Baseline  9/13: 14 seconds with one near LOB 7/14: 14.31 seconds 6/21: 16.86 seconds 4/21: 18.8 seconds 06/02/19: 19.93 seconds one hand on RW, one hand on w/c 12/30: 16 secpmds BUE support 02/11/19: 36 seconds 5/19: 20.6s from [facility], one hand on walker and one on WC, 80% upright posture 7/1: 22 seconds one hand on walker one hand on w/c  9/14: 48 seconds with full upright posture 11/24: 25 seconds with full upright posture, one hand on 2WW    Time 12    Period Weeks    Status Partially Met    Target Date 04/10/20      PT LONG TERM GOAL #3   Title Patient will negotiate a bathroom with a walker and Min A for increased indep in home and external environment    Baseline  9/13: able to some in standing, requires sitting down to wash hands 7/14: unable to do  11/24: able to complete voiding in standing and requires sitting down to wash hands    Time 12    Period Weeks    Status Partially Met    Target Date 04/10/20      PT LONG TERM GOAL #4   Title Patient will increase BLE gross strength to 4+/5 as to improve functional strength for independent gait, increased standing tolerance and increased ADL ability.    Baseline  4/21: see note 06/02/19: hip flexion 4-/5, extension/abd 3/5 12/30: hip flexion 4/5 hip extension and abduction 3/5 02/11/19: hip flexion 4/5 hip extension and abduction 2+/54-/5 RLE, 10/15: 4/5 RLE 11/7: 4/5 RLE 3/3: 4/5 7/1:  hip flexion 4/5 hip extension 2/5 hip abduction/adduciton 2/5     Time 12    Period Weeks    Status Partially Met    Target Date 04/10/20      PT LONG TERM GOAL #6   Title Patient will ambulate 200 ft with least assistive device and Supervision with no breaks to allow for increased mobility within home.    Baseline 9/13: performed in previous session 7/14: 190 ft with RW 4/21: ambulate 150 ft 6/21: had to terminate after 90 ft due to cardiac  11/24: level surface with 2WW for 65 ft, limited due to fatigue and not feeling good  today    Time 12    Period Weeks  Status Achieved    Target Date 04/11/19      PT LONG TERM GOAL #7   Title Patient will perform 10 MWT in <30 seconds for improved gait speed, gait mechanics, and functional capacity for mobility.     Baseline 10/11: 48s with BRW and WC follow; 9/13: terminated due to wardrobe malfunction 9/8: 42 seconds with R foot collapse 7/14: 37.98 seconds 6/21: 46.9 seconds with RW 07/28/19: 51 seconds with RW 06/02/19: 62.5 seconds with RW 11/4: 56 seconds with AFO and RW 12/30: 53 seconds with AFO and RW     Time 12    Period Weeks    Status Partially Met    Target Date 04/10/20      PT LONG TERM GOAL #8   Title Patient will increase Berg Balance score by > 6 points (38/56) to demonstrate decreased fall risk during functional activities.    Baseline 9/13: 31/56 7/14: 33/56 07/28/19: 32/56 6/21: 32/56     Time 12    Period Weeks    Status Partially Met    Target Date 04/10/20                 Plan - 03/20/20 1404    Clinical Impression Statement Continued with current plan of care as laid out in evaluation and recent prior sessions. Pt remains motivated to advance progress toward goals. Returned to sustained Nustep activity this date, incorporated HIIT x2 on Nustep as well for cardiovascular and neurological benefits. Rest breaks provided as needed, pt quick to ask when needed. Pt does require varying levels of assistance and cuing for completion of exercises for correct form and sometimes due to pain/weakness. Pt closely monitored throughout session for safe vitals response and to maximize patient safety during interventions. Pt continues to demonstrate progress toward goals AEB progression of some interventions this date either in volume or intensity.   Stability/Clinical Decision Making Stable/Uncomplicated    Clinical Decision Making High    Rehab Potential Fair    Clinical Impairments Affecting Rehab Potential hx of HTN, HLD, CVA, learning disability,  Diabetes, brain tumor,     PT Frequency 2x / week    PT Duration 12 weeks    PT Treatment/Interventions ADLs/Self Care Home Management;Aquatic Therapy;Ultrasound;Moist Heat;Traction;DME Instruction;Gait training;Stair training;Functional mobility training;Therapeutic activities;Therapeutic exercise;Orthotic Fit/Training;Neuromuscular re-education;Balance training;Patient/family education;Manual techniques;Wheelchair mobility training;Passive range of motion;Energy conservation;Taping;Visual/perceptual remediation/compensation    PT Next Visit Plan progress standing balance    PT Home Exercise Plan TrA contraction, glute sets: needs updates. Potentially some time in supine flat in hospital bed, to improve hip extension deficits.     Consulted and Agree with Plan of Care Patient           Patient will benefit from skilled therapeutic intervention in order to improve the following deficits and impairments:  Abnormal gait,Decreased activity tolerance,Decreased balance,Decreased knowledge of precautions,Decreased endurance,Decreased coordination,Decreased knowledge of use of DME,Decreased mobility,Decreased range of motion,Difficulty walking,Decreased safety awareness,Decreased strength,Impaired flexibility,Impaired perceived functional ability,Impaired tone,Postural dysfunction,Improper body mechanics,Pain  Visit Diagnosis: Muscle weakness (generalized)  Other lack of coordination  Other abnormalities of gait and mobility  Left-sided weakness  Hemiplegia and hemiparesis following cerebral infarction affecting right dominant side [facility])     Problem List Patient Active Problem List   Diagnosis Date Noted  . Acute CVA (cerebrovascular accident) ([place]) 05/02/2016  . Left-sided weakness 05/01/2016   2:27 PM, 03/20/20 Etta Grandchild, PT, DPT Physical Therapist - [place] [phone]  Deicy Rusk  C 03/20/2020, 2:06 PM  [place] MAIN [facility] SERVICES [address] [place], [place], [address] Phone: [phone]   Fax:  [phone]  Name: RAVIN DENARDO MRN: [id number] Date of Birth: 05/18/1965

## 2020-03-22 ENCOUNTER — Other Ambulatory Visit: Payer: Self-pay

## 2020-03-22 ENCOUNTER — Ambulatory Visit: Payer: Medicare HMO

## 2020-03-22 ENCOUNTER — Ambulatory Visit: Payer: Medicare HMO | Admitting: Occupational Therapy

## 2020-03-22 ENCOUNTER — Encounter: Payer: Self-pay | Admitting: Occupational Therapy

## 2020-03-22 DIAGNOSIS — M6281 Muscle weakness (generalized): Secondary | ICD-10-CM

## 2020-03-22 DIAGNOSIS — R2689 Other abnormalities of gait and mobility: Secondary | ICD-10-CM

## 2020-03-22 DIAGNOSIS — R278 Other lack of coordination: Secondary | ICD-10-CM

## 2020-03-22 DIAGNOSIS — R531 Weakness: Secondary | ICD-10-CM

## 2020-03-22 DIAGNOSIS — I69351 Hemiplegia and hemiparesis following cerebral infarction affecting right dominant side: Secondary | ICD-10-CM

## 2020-03-22 NOTE — Therapy (Signed)
[place] MAIN [facility] SERVICES [address] [place], [place], [address] Phone: [phone]   Fax:  [phone]  Occupational Therapy Progress Note/Recertification  Dates of reporting period  02/11/2020   to   03/22/2020  Patient Details  Name: Curtis Powell MRN: [id number] Date of Birth: 06/17/1965 No data recorded  Encounter Date: 03/22/2020   OT End of Session - 03/22/20 1445    Visit Number 240    Number of Visits 258    Date for OT Re-Evaluation 06/14/2020    Authorization Type Progress reporting period starting 02/11/2020    OT Start Time 1436    OT Stop Time 1515    OT Time Calculation (min) 39 min    Activity Tolerance Patient tolerated treatment well    Behavior During Therapy [facility] for tasks assessed/performed           Past Medical History:  Diagnosis Date  . GERD (gastroesophageal reflux disease)   . Hyperlipidemia   . Hypertension   . Obesity   . Stroke [facility])     Past Surgical History:  Procedure Laterality Date  . BRAIN SURGERY      There were no vitals filed for this visit.   Subjective Assessment - 03/22/20 1444    Subjective  Pt. Reports feeling ggod today.   Patient is accompanied by: Family member    Pertinent History Pt. is a 54 y.o. male who suffered a CVA on 05/01/2016. Pt. was admitted to the hospital. Once discharged, he received Home Health PT and OT services for about a month. Pt. has had multiple CVAs over the past 8 years, and has had multiple falls in the past 6 months. Pt. resides in an apartment. Pt. has caregivers for 80 hours. Pt.'s mother stays with pt. at night, and assists with IADL tasks.    Currently in Pain? No/denies          OT TREATMENT   Therapeutic Exercise:  Pt. performed4# dowel ex.for UE strengthening secondary to weakness. Bilateral shoulder flexion, chest press,circular, andpatterns were performedfor 1-2sets20reps.  Measurements were obtained, goals were  reviewed with the pt. Pt.Continues to makeprogress, andcontinuesto improveBUEstrengthoverall.Pt.continues to progress towards goals. Pt. Continues to work on improving BUE strength for yard work, and functional reaching into closets, standing tolerance for IADL tasks, [facility] skills for sewing a button onto a shirt, and writing legibility.Pt.requires verbal, and tactile cues forthe properthere. Ex.technique.Pt. continues to present with limited BUE strength, and [facility] skills. Pt.continues to work on improving UE strength, and [facility] skills in order to work towards maximizing independence with ADLs, and IADLtasks.                       OT Education - 03/22/20 1444    Education provided Yes    Education Details grip exercises, strengthening    Person(s) Educated Patient    Methods Explanation;Demonstration    Comprehension Verbalized understanding;Returned demonstration;Verbal cues required            OT Short Term Goals - 03/03/17 1459      OT SHORT TERM GOAL #1   Title `             OT Long Term Goals - 03/22/20 1400      OT LONG TERM GOAL #1   Title Pt. will UE functioning to be able to perfrom toilet hygiene with minimal assistance. (P)     Baseline 01/14/2018 - Pt completes toileting hygiene independently.  Pt requires assistance managing LB clothing. (P)     Time 12 (P)     Period Weeks (P)     Status Achieved (P)       OT LONG TERM GOAL #2   Title Pt. will complete self grooming with minA (P)     Baseline 02/24/2018 - Pt is independent with oral care and washing his face and occasionally requires increased time if he is having a bad day. Pt's shaving is completed by his barber. (P)     Time 12 (P)     Period Weeks (P)     Status Achieved (P)       OT LONG TERM GOAL #3   Title Pt. will perform self-feeding skills with modified indepndence (P)     Baseline 01/14/2018 - Pt is able to complete self-feeding independently with minimal spillage. Pt is  unable to cut meat at this time. (P)     Time 12 (P)     Period Weeks (P)     Status Achieved (P)       OT LONG TERM GOAL #4   Title Pt. will perform self dressing with minA and A/E as needed. (P)     Baseline Pt. continues to be able to donn a T shirt independently with increased time. MinA donning a jacket, Mod A donning pants over his feet, and socks, minA with shoes. (P)     Time 12 (P)     Period Weeks (P)     Status On-going (P)     Target Date 06/14/20 (P)       OT LONG TERM GOAL #5   Title Pt. will perform light home making tasks with minA (P)     Baseline Pt is able to wash dishes while seated in w/c with increased accessibility of new apartment. Pt. is able to fold his clothes, his moms puts them in the and out of the washer, and dryer, and puts them onto hangers. Caregivers perform vacuuming, and dusting. (P)     Time 12 (P)     Period Weeks (P)     Status Achieved (P)       OT LONG TERM GOAL #6   Title Pt. will write his name efficiently with 100% legibility (P)     Baseline Pt. continues to present with 75% legibility with increased time required when printing his name, and 50% legibility when cursive writing his name to sign in when riding the van (P)     Time 12 (P)     Period Weeks (P)     Status On-going (P)     Target Date 06/14/20 (P)       OT LONG TERM GOAL #7   Title Pt will independently and consistently follow HEP to increase UE strength to increase functional independence (P)     Baseline Pt. continues to work towards independence with exercises (P)     Time 12 (P)     Period Weeks (P)     Status On-going (P)     Target Date 06/14/20 (P)       OT LONG TERM GOAL #8   Title Pt. will require supervision ironing a shirt. (P)     Baseline Pt. has progressed CGA, and complete set-up seated using a tabletop iron (P)     Time 12 (P)     Period Weeks (P)     Status Partially Met (P)       OT LONG  TERM GOAL  #9   Baseline Pt. will independently be able to sew  a button onto a shirt. (P)     Time 12 (P)     Period Weeks (P)     Status On-going (P)     Target Date 06/14/20 (P)       OT LONG TERM GOAL  #10   TITLE Pt. will independently be able to throw a ball (P)     Baseline Pt. continues to be limited with throwing overhand, however pt. is able to perform underhand throwing. (P)     Time 12 (P)     Period Weeks (P)     Status On-going (P)     Target Date 06/14/20 (P)       OT LONG TERM GOAL  #11   TITLE Pt. will improve UE functional reaching to be able to independently use his RUE to hand his clothes in the closest. (P)     Baseline Pt. continues to improve with reaching up with the RUE, however has difficulty reaching the hanger rack in the closet. Pt. now uses a reacher. (P)     Time 12 (P)     Period Weeks (P)     Status On-going (P)     Target Date 06/14/20 (P)       OT LONG TERM GOAL  #12   TITLE Pt. will  be independent with shaving using an electric razer. (P)     Baseline Pt. is able to use the electric razer independently, however is unable to access the position of the plug and requires assist form caregivers secondary to the size of the bathroom, and location of the plug. (P)     Time 12 (P)     Period Weeks (P)     Status Achieved (P)       OT LONG TERM GOAL  #13   TITLE Pt. will increase BUE strength to be able to prepare for assisting with yardwork. (P)     Baseline Pt. BUE strength conitnues to be 4+/5 Pt. reports that he has been able to get out to the front. (P)     Time 12 (P)     Period Weeks (P)     Status On-going (P)       OT LONG TERM GOAL  #14   TITLE Pt. will improve activity toelrance in standing to be able to complete standing ADLs, and IADLs with SBA. (P)     Baseline Pt. is improving with standing tolerance, however is still limited with ADL performance in standing. (P)     Time 12 (P)     Period Weeks (P)     Status On-going (P)                  Plan - 03/22/20 1445    Clinical Impression  Statement Measurements were obtained, goals were reviewed with the pt. Pt.Continues to makeprogress, andcontinuesto improveBUEstrengthoverall.Pt.continues to progress towards goals. Pt. Continues to work on improving BUE strength for yard work, and functional reaching into closets, standing tolerance for IADL tasks, [facility] skills for sewing a button onto a shirt, and writing legibility.Pt.requires verbal, and tactile cues forthe properthere. Ex.technique.Pt. continues to present with limited BUE strength, and [facility] skills. Pt.continues to work on improving UE strength, and [facility] skills in order to work towards maximizing independence with ADLs, and IADLtasks.   OT Occupational Profile and History Problem Focused Assessment - Including review of records relating to  presenting problem    Occupational performance deficits (Please refer to evaluation for details): ADL's;IADL's    Body Structure / Function / Physical Skills ADL;FMC;ROM;IADL;Pain;Coordination    Rehab Potential Fair    Clinical Decision Making Several treatment options, min-mod task modification necessary    Comorbidities Affecting Occupational Performance: May have comorbidities impacting occupational performance    Modification or Assistance to Complete Evaluation  Min-Moderate modification of tasks or assist with assess necessary to complete eval    OT Frequency 2x / week    OT Duration 12 weeks    Consulted and Agree with Plan of Care Patient           Patient will benefit from skilled therapeutic intervention in order to improve the following deficits and impairments:   Body Structure / Function / Physical Skills: ADL,FMC,ROM,IADL,Pain,Coordination       Visit Diagnosis: Muscle weakness (generalized)  Other lack of coordination    Problem List Patient Active Problem List   Diagnosis Date Noted  . Acute CVA (cerebrovascular accident) ([place]) 05/02/2016  . Left-sided weakness 05/01/2016    Harrel Carina,  MS, OTR/L 03/22/2020, 5:11 PM  [place] MAIN [facility] SERVICES [address] [place], [place], [address] Phone: [phone]   Fax:  [phone]  Name: HERBERT MARKEN MRN: [id number] Date of Birth: 10/11/1965

## 2020-03-22 NOTE — Therapy (Signed)
[place] MAIN [facility] SERVICES [address] [place], [place], [address] Phone: [phone]   Fax:  [phone]  Physical Therapy Treatment  Patient Details  Name: Curtis Powell MRN: [id number] Date of Birth: 1965-11-20 No data recorded  Encounter Date: 03/22/2020   PT End of Session - 03/22/20 1332    Visit Number 256    Number of Visits 277    Date for PT Re-Evaluation 04/10/20    PT Start Time 1315    PT Stop Time 1410    PT Time Calculation (min) 55 min    Equipment Utilized During Treatment Gait belt    Activity Tolerance Patient tolerated treatment well;No increased pain    Behavior During Therapy WFL for tasks assessed/performed           Past Medical History:  Diagnosis Date  . GERD (gastroesophageal reflux disease)   . Hyperlipidemia   . Hypertension   . Obesity   . Stroke [facility])     Past Surgical History:  Procedure Laterality Date  . BRAIN SURGERY      There were no vitals filed for this visit.   Subjective Assessment - 03/22/20 1332    Subjective Pt doing well today, no update sinc eprior session. No pain in back after stretches.    Pertinent History  Patient is a pleasant 54 year old male who presents to physical therapy for weakness and immobility secondary to CVA.  Had a stroke in Feb 2018. He was previously fully independent, but this stroke caused severe residual deficits, mainly on the right side as well as speech, and now he is unable to walk or perform most of his ADLs on his own. Entire right side is very weak. He still has a little difficulty with speech but his swallowing is improved to baseline. His mom had to move in with him and now is his main caregiver.     Limitations Reading    How long can you sit comfortably? 5 minutes    How long can you stand comfortably? 5 minutes    How long can you walk comfortably? 8 minutes    Patient Stated Goals Walk without walker, walk further with walker, get some  strength in back     Currently in Pain? No/denies           INTERVENTION THIS DATE: -AMB OVERGROUND wide [id number], 10 minutes, 0.038m -resistance AMB overground, 5lb AW bilat [id number] -resistance AMB overground, 10lb AW bilat x15f37ftwice with seated break between) (0.57m/56mLateral side stepping in // bars 2x each direction -standing trunk extension, hips supported by // bar 10x3secH (author provides slide overpressure within patients tolerance) -retro AMB in // bars 1x20ft 35fPT Short Term Goals - 01/24/20 1711      PT SHORT TERM GOAL #1   Title Patient will be independent in home exercise program to improve strength/mobility for better functional independence with ADLs.    Baseline hep compliant    Time 2    Period Weeks    Status Achieved      PT SHORT TERM GOAL #2   Title Patient will require min cueing for STS transfer with CGA for increased independence with mobility.     Baseline CGA    Time 2    Period Weeks    Status Achieved      PT SHORT TERM GOAL #3   Title Patient will maintain upright posture for > 15 seconds  to demonstrate strengthened postural control muscualture     Baseline 18 seconds     Time 2    Period Weeks    Status Achieved             PT Long Term Goals - 03/01/20 0001      PT LONG TERM GOAL #1   Title Patient will perform bed mobility (rolling and supine <>sit) with Mod I  to increase mobility and  perform ADLs.     Baseline 9/13: able to perform with mod I occasional Min A when very fatigued 7/14: Min A 4/21: unable to perform 6/15: improving but still min-mod A  11/24: min-modA, difficulty breathing with changing positions    Time 12    Period Weeks    Status Partially Met    Target Date 04/10/20      PT LONG TERM GOAL #2   Title Patient (< 62 years old) will complete five times sit to stand test in < 10 seconds indicating an increased LE strength and improved balance.    Baseline  9/13: 14 seconds with one near LOB 7/14: 14.31  seconds 6/21: 16.86 seconds 4/21: 18.8 seconds 06/02/19: 19.93 seconds one hand on RW, one hand on w/c 12/30: 16 secpmds BUE support 02/11/19: 36 seconds 5/19: 20.6s from [facility], one hand on walker and one on WC, 80% upright posture 7/1: 22 seconds one hand on walker one hand on w/c  9/14: 48 seconds with full upright posture 11/24: 25 seconds with full upright posture, one hand on 2WW    Time 12    Period Weeks    Status Partially Met    Target Date 04/10/20      PT LONG TERM GOAL #3   Title Patient will negotiate a bathroom with a walker and Min A for increased indep in home and external environment    Baseline  9/13: able to some in standing, requires sitting down to wash hands 7/14: unable to do  11/24: able to complete voiding in standing and requires sitting down to wash hands    Time 12    Period Weeks    Status Partially Met    Target Date 04/10/20      PT LONG TERM GOAL #4   Title Patient will increase BLE gross strength to 4+/5 as to improve functional strength for independent gait, increased standing tolerance and increased ADL ability.    Baseline  4/21: see note 06/02/19: hip flexion 4-/5, extension/abd 3/5 12/30: hip flexion 4/5 hip extension and abduction 3/5 02/11/19: hip flexion 4/5 hip extension and abduction 2+/54-/5 RLE, 10/15: 4/5 RLE 11/7: 4/5 RLE 3/3: 4/5 7/1:  hip flexion 4/5 hip extension 2/5 hip abduction/adduciton 2/5     Time 12    Period Weeks    Status Partially Met    Target Date 04/10/20      PT LONG TERM GOAL #6   Title Patient will ambulate 200 ft with least assistive device and Supervision with no breaks to allow for increased mobility within home.    Baseline 9/13: performed in previous session 7/14: 190 ft with RW 4/21: ambulate 150 ft 6/21: had to terminate after 90 ft due to cardiac  11/24: level surface with 2WW for 65 ft, limited due to fatigue and not feeling good today    Time 12    Period Weeks    Status Achieved    Target Date 04/11/19      PT LONG  TERM GOAL #  7   Title Patient will perform 10 MWT in <30 seconds for improved gait speed, gait mechanics, and functional capacity for mobility.     Baseline 10/11: 48s with BRW and WC follow; 9/13: terminated due to wardrobe malfunction 9/8: 42 seconds with R foot collapse 7/14: 37.98 seconds 6/21: 46.9 seconds with RW 07/28/19: 51 seconds with RW 06/02/19: 62.5 seconds with RW 11/4: 56 seconds with AFO and RW 12/30: 53 seconds with AFO and RW     Time 12    Period Weeks    Status Partially Met    Target Date 04/10/20      PT LONG TERM GOAL #8   Title Patient will increase Berg Balance score by > 6 points (38/56) to demonstrate decreased fall risk during functional activities.    Baseline 9/13: 31/56 7/14: 33/56 07/28/19: 32/56 6/21: 32/56     Time 12    Period Weeks    Status Partially Met    Target Date 04/10/20                 Plan - 03/22/20 1333    Clinical Impression Statement Pt arrived early, hence author was able to start session early and go a little longer than typical. Most of session is gait based; sustained gait over 10 minutes, then high intensity short intervals with heavy resistance, side stepping, retro stepping. Pt tolerates session well, is fatigued, but recovers within reasonable time. Pt is visibly fatigued at end of interval walking, some fatigue shaking of legs intermittent. Pt remains motivated, continues to make progress toward goals overall.    Stability/Clinical Decision Making Stable/Uncomplicated    Clinical Decision Making High    Rehab Potential Fair    Clinical Impairments Affecting Rehab Potential hx of HTN, HLD, CVA, learning disability, Diabetes, brain tumor,     PT Frequency 2x / week    PT Duration 12 weeks    PT Treatment/Interventions ADLs/Self Care Home Management;Aquatic Therapy;Ultrasound;Moist Heat;Traction;DME Instruction;Gait training;Stair training;Functional mobility training;Therapeutic activities;Therapeutic exercise;Orthotic  Fit/Training;Neuromuscular re-education;Balance training;Patient/family education;Manual techniques;Wheelchair mobility training;Passive range of motion;Energy conservation;Taping;Visual/perceptual remediation/compensation    PT Next Visit Plan progress standing balance    PT Home Exercise Plan TrA contraction, glute sets: needs updates. Potentially some time in supine flat in hospital bed, to improve hip extension deficits.     Consulted and Agree with Plan of Care Patient    Family Member Consulted mother           Patient will benefit from skilled therapeutic intervention in order to improve the following deficits and impairments:  Abnormal gait,Decreased activity tolerance,Decreased balance,Decreased knowledge of precautions,Decreased endurance,Decreased coordination,Decreased knowledge of use of DME,Decreased mobility,Decreased range of motion,Difficulty walking,Decreased safety awareness,Decreased strength,Impaired flexibility,Impaired perceived functional ability,Impaired tone,Postural dysfunction,Improper body mechanics,Pain  Visit Diagnosis: Muscle weakness (generalized)  Other lack of coordination  Other abnormalities of gait and mobility  Left-sided weakness  Hemiplegia and hemiparesis following cerebral infarction affecting right dominant side [facility])     Problem List Patient Active Problem List   Diagnosis Date Noted  . Acute CVA (cerebrovascular accident) ([place]) 05/02/2016  . Left-sided weakness 05/01/2016   2:10 PM, 03/22/20 Etta Grandchild, PT, DPT Physical Therapist - [place] [facility] 03/22/2020, 2:10 PM  [place] MAIN [facility] SERVICES [address] [place], [place], [address] Phone: [phone]   Fax:  [phone]  Name: MONTY SPICHER MRN: [id number] Date of  Birth: [birth year]

## 2020-03-23 ENCOUNTER — Encounter: Payer: Self-pay | Admitting: Urology

## 2020-03-23 ENCOUNTER — Other Ambulatory Visit: Payer: Self-pay

## 2020-03-23 ENCOUNTER — Ambulatory Visit (INDEPENDENT_AMBULATORY_CARE_PROVIDER_SITE_OTHER): Payer: Medicare HMO | Admitting: Urology

## 2020-03-23 VITALS — BP 130/80 | HR 91 | Ht 60.0 in | Wt 209.0 lb

## 2020-03-23 DIAGNOSIS — N5082 Scrotal pain: Secondary | ICD-10-CM | POA: Diagnosis not present

## 2020-03-23 DIAGNOSIS — R7302 Impaired glucose tolerance (oral): Secondary | ICD-10-CM | POA: Insufficient documentation

## 2020-03-23 DIAGNOSIS — F819 Developmental disorder of scholastic skills, unspecified: Secondary | ICD-10-CM | POA: Insufficient documentation

## 2020-03-23 LAB — URINALYSIS, COMPLETE
Bilirubin, UA: NEGATIVE
Glucose, UA: NEGATIVE
Ketones, UA: NEGATIVE
Leukocytes,UA: NEGATIVE
Nitrite, UA: NEGATIVE
Protein,UA: NEGATIVE
RBC, UA: NEGATIVE
Specific Gravity, UA: 1.01 (ref 1.005–1.030)
Urobilinogen, Ur: 0.2 mg/dL (ref 0.2–1.0)
pH, UA: 7 (ref 5.0–7.5)

## 2020-03-23 LAB — MICROSCOPIC EXAMINATION
Bacteria, UA: NONE SEEN
Epithelial Cells (non renal): NONE SEEN /hpf (ref 0–10)

## 2020-03-23 NOTE — Patient Instructions (Signed)
Pelvic Pain, Male  Pelvic pain is pain in your lower abdomen, below your belly button and between your hips. The pain may start suddenly (be acute), keep coming back (recur), or last a long time (become chronic). Pelvic pain that lasts longer than six months is considered chronic. There are many possible causes of pelvic pain. Sometimes, the cause is not known.  Pelvic pain may affect your:  · Prostate gland.  · Urinary system.  · Digestive tract.  · Musculoskeletal system. Strained muscles or ligaments may cause pelvic pain.  Follow these instructions at home:    Medicines  · Take over-the-counter and prescription medicines only as told by your health care provider.  · If you were prescribed an antibiotic medicine, take it as told by your health care provider. Do not stop taking the antibiotic even if you start to feel better.  Managing pain, stiffness, and swelling    · Take warm water baths (sitz baths). Sitz baths help with relaxing your pelvic floor muscles.  ? For a sitz bath, the water only comes up to your hips and covers your buttocks. A sitz bath may done at home in a bathtub or with a portable sitz bath that fits over the toilet.  · If directed, apply heat to the affected area before you exercise. Use the heat source that your health care provider recommends, such as a moist heat pack or a heating pad.  ? Place a towel between your skin and the heat source.  ? Leave the heat on for 20-30 minutes.  ? Remove the heat if your skin turns bright red. This is especially important if you are unable to feel pain, heat, or cold. You may have a greater risk of getting burned.  General instructions  · Rest as told by your health care provider.  · Keep a journal of your pelvic pain. Write down:  ? When the pain started.  ? Where the pain is located.  ? What seems to make the pain better or worse.  ? Any symptoms you have along with the pain.  · Follow your treatment plan as told by your health care provider. This may  include:  ? Pelvic physical therapy.  ? Yoga, meditation, and exercise.  ? Biofeedback. This process trains you to manage your body's response (physiological response) through breathing techniques and relaxation methods. You will work with a therapist while machines are used to monitor your physical symptoms.  ? Acupuncture. This is a type of treatment that involves stimulating specific points on your body by inserting thin needles through your skin to treat pain.  · Keep all follow-up visits as told by your health care provider. This is important.  Contact a health care provider if:  · Medicine does not help your pain.  · Your pain comes back.  · You have new symptoms.  · You have a fever or chills.  · You are constipated.  · You have blood in your urine or stool.  · You feel weak or light-headed.  Get help right away if:  · You have sudden severe pain.  · Your pain steadily gets worse.  · You have severe pain along with fever, nausea, vomiting, or excessive sweating.  Summary  · Pelvic pain is pain in your lower abdomen, below your belly button and between your hips. There are many possible causes of pelvic pain. Sometimes, the cause is not known.  · Take over-the-counter and prescription medicines only as told   Get help right away if you have severe pain along with fever, nausea, vomiting, or excessive sweating.  Keep all follow-up visits as told by your health care provider. This is important. This information is not intended to replace advice given to you by your health care provider. Make sure you discuss any questions you have with your health care provider. Document Revised: 08/13/2017 Document Reviewed: 08/13/2017 Elsevier Patient Education   [place].

## 2020-03-23 NOTE — Addendum Note (Signed)
Addended by: Tommy Rainwater on: 03/23/2020 11:33 AM   Modules accepted: Orders

## 2020-03-23 NOTE — Progress Notes (Addendum)
03/23/20 10:03 AM   Curtis Powell 1966-02-17 [id number]  CC: Right scrotal pain  HPI: I saw Curtis Powell today for evaluation of right scrotal pain.  He is a very comorbid 54 year old male with morbid obesity and BMI of 41, history of hypertension, stroke who is wheelchair-bound.  He is not here with any caregiver today.  He is on Plavix for his history of stroke.  He reports about 6 weeks of intermittent right-sided scrotal pain.  He is unable to identify any aggravating or alleviating factors.  It bothers him every day, but comes and goes.  He was seen by his PCP ~4 weeks ago and treated empirically for epididymitis with Rocephin and doxycycline.  I do not see that a urinalysis or culture was sent.  He reports minimal to no improvement in his scrotal pain with these antibiotics.  He denies any urinary symptoms or gross hematuria.  He denies any fevers or chills.  Urinalysis today completely benign with 0-5 WBCs, 0-2 RBCs, no bacteria, no yeast, nitrite negative, no leukocytes.  PSA normal at 0.86 in September 2021.  He had a CT in January 2019 that showed no urologic abnormalities, including no hydronephrosis, stones, or pelvic abnormalities.    PMH: Past Medical History:  Diagnosis Date  . GERD (gastroesophageal reflux disease)   . Hyperlipidemia   . Hypertension   . Obesity   . Stroke [facility])     Surgical History: Past Surgical History:  Procedure Laterality Date  . BRAIN SURGERY      Family History: Family History  Problem Relation Age of Onset  . Stroke Mother   . Hypertension Mother     Social History:  reports that he has never smoked. He has never used smokeless tobacco. He reports that he does not drink alcohol and does not use drugs.  Physical Exam: BP 130/80   Pulse 91   Ht 5' (1.524 m)   Wt 209 lb (94.8 kg)   BMI 40.82 kg/m    Constitutional: In wheelchair, obese Cardiovascular: No clubbing, cyanosis, or edema. Respiratory: Normal respiratory effort,  no increased work of breathing. GI: Abdomen is soft, nontender, nondistended, no abdominal masses GU: Circumcised phallus with patent meatus, no lesions.  Right testicle slightly edematous but nontender throughout, no masses, left testicle 20 cc and descended without masses or tenderness  Laboratory Data: Reviewed, see HPI  Pertinent Imaging: I have personally viewed and interpreted the CT from January 2019 that shows no urologic abnormalities  Assessment & Plan:   He is a 54 year old very comorbid male with a history of stroke who remains wheelchair-bound who reports about 6 weeks of intermittent right-sided scrotal pain.  He was treated empirically with antibiotics by his PCP for possible epididymitis with no significant improvement.  No urinalysis or culture has been sent, and he was unable to void for a urine sample today.  With his benign physical exam today and no tenderness, no improvement on antibiotics previously, and completely benign urinalysis, I do not suspect epididymitis as the etiology of his symptoms.  We reviewed possible etiologies of scrotal pain at length.  He cannot take NSAIDs secondary to his Plavix.  I recommended snug fitting underwear, icing, and a scrotal ultrasound for further evaluation.  If he has persistent scrotal pain despite a negative ultrasound, could consider trial of amitriptyline 25 mg nightly or gabapentin 300 mg twice daily.  We will call with scrotal ultrasound results  Nickolas Madrid, MD 03/23/2020  [place] [phone]  [address], [place], [place] [address] [phone]

## 2020-03-27 ENCOUNTER — Ambulatory Visit: Payer: Medicare HMO | Admitting: Occupational Therapy

## 2020-03-27 ENCOUNTER — Ambulatory Visit: Payer: Medicare HMO

## 2020-03-27 ENCOUNTER — Other Ambulatory Visit: Payer: Self-pay

## 2020-03-27 ENCOUNTER — Encounter: Payer: Self-pay | Admitting: Occupational Therapy

## 2020-03-27 DIAGNOSIS — M6281 Muscle weakness (generalized): Secondary | ICD-10-CM

## 2020-03-27 DIAGNOSIS — R2689 Other abnormalities of gait and mobility: Secondary | ICD-10-CM

## 2020-03-27 DIAGNOSIS — R278 Other lack of coordination: Secondary | ICD-10-CM

## 2020-03-27 DIAGNOSIS — R531 Weakness: Secondary | ICD-10-CM

## 2020-03-27 NOTE — Therapy (Signed)
[place] MAIN [facility] SERVICES [address] [place], [place], [address] Phone: [phone]   Fax:  [phone]  Occupational Therapy Treatment  Patient Details  Name: Curtis Powell MRN: [id number] Date of Birth: 1965/07/19 No data recorded  Encounter Date: 03/27/2020   OT End of Session - 03/27/20 1442    Visit Number [id number]    Date for OT Re-Evaluation 04/03/20    Authorization Type Progress reporting period starting 09/15/2019    OT Start Time 1433    OT Stop Time 1515    OT Time Calculation (min) 42 min    Activity Tolerance Patient tolerated treatment well    Behavior During Therapy [facility] for tasks assessed/performed           Past Medical History:  Diagnosis Date  . GERD (gastroesophageal reflux disease)   . Hyperlipidemia   . Hypertension   . Obesity   . Stroke [facility])     Past Surgical History:  Procedure Laterality Date  . BRAIN SURGERY      There were no vitals filed for this visit.   Subjective Assessment - 03/27/20 1440    Subjective  Pt. reports being sore in his groin over the weekend.    Patient is accompanied by: Family member    Pertinent History Pt. is a 54 y.o. male who suffered a CVA on 05/01/2016. Pt. was admitted to the hospital. Once discharged, he received Home Health PT and OT services for about a month. Pt. has had multiple CVAs over the past 8 years, and has had multiple falls in the past 6 months. Pt. resides in an apartment. Pt. has caregivers for 80 hours. Pt.'s mother stays with pt. at night, and assists with IADL tasks.    Currently in Pain? No/denies    Pain Score 1     Pain Location Groin    Pain Type Acute pain    Pain Onset In the past 7 days          OT TREATMENT    Therapeutic Exercise:  Pt. worked on pen control skills with his right hand coloring in a holiday design. Pt. was able to trace the designs, and and accurately fill in both thin, and wide spaces with moderate deviation over the  design pattern.  Pt. reports that he had a difficult weekend secondary to having groin pain. Pt. continues to have groin discomfort, however reports that it is now a 1/10. Pt. required increased time to work on the task. Pt. Was able to attend to the task, and maintain a mature grasp on the pen for the entire duration of the task. Pt. Presented with moderate deviation from the lines on the design. Pt. Continues to work on Armed forces training and education officer, and [facility] skills in order to improve, and maximize independence with ADLs, and IADLs.                           OT Education - 03/27/20 1442    Education provided Yes    Person(s) Educated Patient    Methods Explanation;Demonstration    Comprehension Verbalized understanding;Returned demonstration;Verbal cues required            OT Short Term Goals - 03/03/17 1459      OT SHORT TERM GOAL #1   Title `             OT Long Term Goals - 03/22/20 1400  OT LONG TERM GOAL #1   Title Pt. will UE functioning to be able to perfrom toilet hygiene with minimal assistance. (P)     Baseline 01/14/2018 - Pt completes toileting hygiene independently. Pt requires assistance managing LB clothing. (P)     Time 12 (P)     Period Weeks (P)     Status Achieved (P)       OT LONG TERM GOAL #2   Title Pt. will complete self grooming with minA (P)     Baseline 02/24/2018 - Pt is independent with oral care and washing his face and occasionally requires increased time if he is having a bad day. Pt's shaving is completed by his barber. (P)     Time 12 (P)     Period Weeks (P)     Status Achieved (P)       OT LONG TERM GOAL #3   Title Pt. will perform self-feeding skills with modified indepndence (P)     Baseline 01/14/2018 - Pt is able to complete self-feeding independently with minimal spillage. Pt is unable to cut meat at this time. (P)     Time 12 (P)     Period Weeks (P)     Status Achieved (P)       OT LONG TERM GOAL #4    Title Pt. will perform self dressing with minA and A/E as needed. (P)     Baseline Pt. continues to be able to donn a T shirt independently with increased time. MinA donning a jacket, Mod A donning pants over his feet, and socks, minA with shoes. (P)     Time 12 (P)     Period Weeks (P)     Status On-going (P)     Target Date 06/14/20 (P)       OT LONG TERM GOAL #5   Title Pt. will perform light home making tasks with minA (P)     Baseline Pt is able to wash dishes while seated in w/c with increased accessibility of new apartment. Pt. is able to fold his clothes, his moms puts them in the and out of the washer, and dryer, and puts them onto hangers. Caregivers perform vacuuming, and dusting. (P)     Time 12 (P)     Period Weeks (P)     Status Achieved (P)       OT LONG TERM GOAL #6   Title Pt. will write his name efficiently with 100% legibility (P)     Baseline Pt. continues to present with 75% legibility with increased time required when printing his name, and 50% legibility when cursive writing his name to sign in when riding the van (P)     Time 12 (P)     Period Weeks (P)     Status On-going (P)     Target Date 06/14/20 (P)       OT LONG TERM GOAL #7   Title Pt will independently and consistently follow HEP to increase UE strength to increase functional independence (P)     Baseline Pt. continues to work towards independence with exercises (P)     Time 12 (P)     Period Weeks (P)     Status On-going (P)     Target Date 06/14/20 (P)       OT LONG TERM GOAL #8   Title Pt. will require supervision ironing a shirt. (P)     Baseline Pt. has progressed CGA, and complete set-up seated  using a tabletop iron (P)     Time 12 (P)     Period Weeks (P)     Status Partially Met (P)       OT LONG TERM GOAL  #9   Baseline Pt. will independently be able to sew a button onto a shirt. (P)     Time 12 (P)     Period Weeks (P)     Status On-going (P)     Target Date 06/14/20 (P)       OT  LONG TERM GOAL  #10   TITLE Pt. will independently be able to throw a ball (P)     Baseline Pt. continues to be limited with throwing overhand, however pt. is able to perform underhand throwing. (P)     Time 12 (P)     Period Weeks (P)     Status On-going (P)     Target Date 06/14/20 (P)       OT LONG TERM GOAL  #11   TITLE Pt. will improve UE functional reaching to be able to independently use his RUE to hand his clothes in the closest. (P)     Baseline Pt. continues to improve with reaching up with the RUE, however has difficulty reaching the hanger rack in the closet. Pt. now uses a reacher. (P)     Time 12 (P)     Period Weeks (P)     Status On-going (P)     Target Date 06/14/20 (P)       OT LONG TERM GOAL  #12   TITLE Pt. will  be independent with shaving using an electric razer. (P)     Baseline Pt. is able to use the electric razer independently, however is unable to access the position of the plug and requires assist form caregivers secondary to the size of the bathroom, and location of the plug. (P)     Time 12 (P)     Period Weeks (P)     Status Achieved (P)       OT LONG TERM GOAL  #13   TITLE Pt. will increase BUE strength to be able to prepare for assisting with yardwork. (P)     Baseline Pt. BUE strength conitnues to be 4+/5 Pt. reports that he has been able to get out to the front. (P)     Time 12 (P)     Period Weeks (P)     Status On-going (P)       OT LONG TERM GOAL  #14   TITLE Pt. will improve activity toelrance in standing to be able to complete standing ADLs, and IADLs with SBA. (P)     Baseline Pt. is improving with standing tolerance, however is still limited with ADL performance in standing. (P)     Time 12 (P)     Period Weeks (P)     Status On-going (P)                  Plan - 03/27/20 1443    Clinical Impression Statement Pt. reports that he had a difficult weekend secondary to having groin pain. Pt. continues to have groin discomfort,  however reports that it is now a 1/10. Pt. required increased time to work on the task. Pt. Was able to attend to the task, and maintain a mature grasp on the pen for the entire duration of the task. Pt. Presented with moderate deviation from the lines on the design. Pt.  Continues to work on Armed forces training and education officer, and [facility] skills in order to improve, and maximize independence with ADLs, and IADLs.   Occupational performance deficits (Please refer to evaluation for details): ADL's;IADL's    Body Structure / Function / Physical Skills ADL;FMC;ROM;IADL;Pain;Coordination    Rehab Potential Fair    Clinical Decision Making Several treatment options, min-mod task modification necessary    Comorbidities Affecting Occupational Performance: May have comorbidities impacting occupational performance    Modification or Assistance to Complete Evaluation  Min-Moderate modification of tasks or assist with assess necessary to complete eval    OT Frequency 2x / week    OT Duration 12 weeks    OT Treatment/Interventions Self-care/ADL training;Patient/family education;DME and/or AE instruction;Therapeutic exercise;Neuromuscular education    Consulted and Agree with Plan of Care Patient           Patient will benefit from skilled therapeutic intervention in order to improve the following deficits and impairments:   Body Structure / Function / Physical Skills: ADL,FMC,ROM,IADL,Pain,Coordination       Visit Diagnosis: Muscle weakness (generalized)  Other lack of coordination    Problem List Patient Active Problem List   Diagnosis Date Noted  . Impaired glucose tolerance 03/23/2020  . Learning disability 03/23/2020  . Urinary and fecal incontinence 06/14/2019  . Chronic pain syndrome 03/23/2019  . Constipation 03/23/2019  . Dysarthria 03/23/2019  . Dysphagia 03/23/2019  . Learning difficulty 03/23/2019  . Morbid obesity ([place]) 03/23/2019  . Muscle weakness 03/23/2019  . Vitamin D deficiency  03/23/2019  . Bilateral carotid artery stenosis 12/15/2018  . Moderate aortic valve stenosis 12/15/2018  . BMI 45.0-49.9, adult ([place]) 11/10/2018  . Cerebrovascular accident ([place]) 05/02/2016  . Hemiparesis affecting right side as late effect of cerebrovascular accident ([place]) 05/01/2016  . Brain tumor ([place]) 11/24/2014  . Gastro-esophageal reflux disease without esophagitis 11/24/2014  . Accident due to mechanical fall without injury 08/03/2014  . Essential hypertension 06/13/2014  . Mixed hyperlipidemia 06/13/2014  . Unspecified sequelae of cerebral infarction 06/13/2014  . 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/2014  . Type 2 diabetes mellitus without complication ([place]) 14/01/3012  . Neck mass 09/08/2013  . Swimmer's ear 09/08/2013  . Erectile dysfunction 01/03/2013  . Prediabetes 01/03/2013    Harrel Carina, MS, OTR/L 03/27/2020, 2:46 PM  [place] MAIN [facility] SERVICES [address] [place], [place], [address] Phone: [phone]   Fax:  [phone]  Name: Curtis Powell MRN: [id number] Date of Birth: [birth year]

## 2020-03-27 NOTE — Therapy (Signed)
[place] MAIN [facility] SERVICES [address] [place], [place], [address] Phone: [phone]   Fax:  [phone]  Physical Therapy Treatment  Patient Details  Name: Curtis Powell MRN: [id number] Date of Birth: 1965/11/15 No data recorded  Encounter Date: 03/27/2020   PT End of Session - 03/27/20 1337    Visit Number [id number]    Number of Visits 277    Date for PT Re-Evaluation 04/10/20    Authorization Type 7/10 PN 03/01/20    PT Start Time 1344    PT Stop Time 1428    PT Time Calculation (min) 44 min    Equipment Utilized During Treatment Gait belt    Activity Tolerance Patient tolerated treatment well;No increased pain    Behavior During Therapy WFL for tasks assessed/performed           Past Medical History:  Diagnosis Date  . GERD (gastroesophageal reflux disease)   . Hyperlipidemia   . Hypertension   . Obesity   . Stroke [facility])     Past Surgical History:  Procedure Laterality Date  . BRAIN SURGERY      There were no vitals filed for this visit.   Subjective Assessment - 03/27/20 1349    Subjective Patient reports he was sore over the weekend. Had to use an icepack. Reports feeling better now but still is sore, went to the doctor for his scrotum pain.    Pertinent History  Patient is a pleasant 53 year old male who presents to physical therapy for weakness and immobility secondary to CVA.  Had a stroke in Feb 2018. He was previously fully independent, but this stroke caused severe residual deficits, mainly on the right side as well as speech, and now he is unable to walk or perform most of his ADLs on his own. Entire right side is very weak. He still has a little difficulty with speech but his swallowing is improved to baseline. His mom had to move in with him and now is his main caregiver.     Limitations Reading    How long can you sit comfortably? 5 minutes    How long can you stand comfortably? 5 minutes    How long can you  walk comfortably? 8 minutes    Patient Stated Goals Walk without walker, walk further with walker, get some strength in back     Currently in Pain? No/denies                  Treatment: Nustep Lvl 4 seat position 8, 4 minutes for cardiovascular support/challenge    Gait: Patient ambulates 160 ft with RW, CGA, and Wheelchair follow for safety. Patient requires min verbal cues for proximity to walker throughout gait trial as well as upright posture and weight shift.  Improved gait mechanics and gait speed. Two episodes of posterior LOB requiring min A to return to COM      Standing with RW: (wheelchair behind)  6" step toe taps 10x each LE, very challenging for patient, requires stabilization to RW performed.    Shadow box with close CGA, patient able to stabilize self and correct for LOB. X 2 minutes   Seated:    balloon taps reaching inside/outside BOS without LOB x 2 minutes sit to stand 10x with decreasing UE support  RTB df 10x, pf 10x    5lb ankle weight:  -LAQ 15x 3-5 second holds each LE,  -march with upright posture 15x each LE -  alternating IR/ER 15x each LE    Vitals monitored throughout session with rest breaks to return HR to therapeutic range.    Pt educated throughout session about proper posture and technique with exercises. Improved exercise technique, movement at target joints, use of target muscles after min to mod verbal, visual, tactile cues.                     PT Education - 03/27/20 1337    Education provided Yes    Education Details exercise technique, body mechanics.    Person(s) Educated Patient    Methods Explanation;Demonstration;Tactile cues;Verbal cues    Comprehension Verbalized understanding;Returned demonstration;Verbal cues required;Tactile cues required            PT Short Term Goals - 01/24/20 1711      PT SHORT TERM GOAL #1   Title Patient will be independent in home exercise program to improve strength/mobility  for better functional independence with ADLs.    Baseline hep compliant    Time 2    Period Weeks    Status Achieved      PT SHORT TERM GOAL #2   Title Patient will require min cueing for STS transfer with CGA for increased independence with mobility.     Baseline CGA    Time 2    Period Weeks    Status Achieved      PT SHORT TERM GOAL #3   Title Patient will maintain upright posture for > 15 seconds to demonstrate strengthened postural control muscualture     Baseline 18 seconds     Time 2    Period Weeks    Status Achieved             PT Long Term Goals - 03/01/20 0001      PT LONG TERM GOAL #1   Title Patient will perform bed mobility (rolling and supine <>sit) with Mod I  to increase mobility and  perform ADLs.     Baseline 9/13: able to perform with mod I occasional Min A when very fatigued 7/14: Min A 4/21: unable to perform 6/15: improving but still min-mod A  11/24: min-modA, difficulty breathing with changing positions    Time 12    Period Weeks    Status Partially Met    Target Date 04/10/20      PT LONG TERM GOAL #2   Title Patient (< 35 years old) will complete five times sit to stand test in < 10 seconds indicating an increased LE strength and improved balance.    Baseline  9/13: 14 seconds with one near LOB 7/14: 14.31 seconds 6/21: 16.86 seconds 4/21: 18.8 seconds 06/02/19: 19.93 seconds one hand on RW, one hand on w/c 12/30: 16 secpmds BUE support 02/11/19: 36 seconds 5/19: 20.6s from [facility], one hand on walker and one on WC, 80% upright posture 7/1: 22 seconds one hand on walker one hand on w/c  9/14: 48 seconds with full upright posture 11/24: 25 seconds with full upright posture, one hand on 2WW    Time 12    Period Weeks    Status Partially Met    Target Date 04/10/20      PT LONG TERM GOAL #3   Title Patient will negotiate a bathroom with a walker and Min A for increased indep in home and external environment    Baseline  9/13: able to some in standing,  requires sitting down to wash hands 7/14: unable to do  11/24: able to complete voiding in standing and requires sitting down to wash hands    Time 12    Period Weeks    Status Partially Met    Target Date 04/10/20      PT LONG TERM GOAL #4   Title Patient will increase BLE gross strength to 4+/5 as to improve functional strength for independent gait, increased standing tolerance and increased ADL ability.    Baseline  4/21: see note 06/02/19: hip flexion 4-/5, extension/abd 3/5 12/30: hip flexion 4/5 hip extension and abduction 3/5 02/11/19: hip flexion 4/5 hip extension and abduction 2+/54-/5 RLE, 10/15: 4/5 RLE 11/7: 4/5 RLE 3/3: 4/5 7/1:  hip flexion 4/5 hip extension 2/5 hip abduction/adduciton 2/5     Time 12    Period Weeks    Status Partially Met    Target Date 04/10/20      PT LONG TERM GOAL #6   Title Patient will ambulate 200 ft with least assistive device and Supervision with no breaks to allow for increased mobility within home.    Baseline 9/13: performed in previous session 7/14: 190 ft with RW 4/21: ambulate 150 ft 6/21: had to terminate after 90 ft due to cardiac  11/24: level surface with 2WW for 65 ft, limited due to fatigue and not feeling good today    Time 12    Period Weeks    Status Achieved    Target Date 04/11/19      PT LONG TERM GOAL #7   Title Patient will perform 10 MWT in <30 seconds for improved gait speed, gait mechanics, and functional capacity for mobility.     Baseline 10/11: 48s with BRW and WC follow; 9/13: terminated due to wardrobe malfunction 9/8: 42 seconds with R foot collapse 7/14: 37.98 seconds 6/21: 46.9 seconds with RW 07/28/19: 51 seconds with RW 06/02/19: 62.5 seconds with RW 11/4: 56 seconds with AFO and RW 12/30: 53 seconds with AFO and RW     Time 12    Period Weeks    Status Partially Met    Target Date 04/10/20      PT LONG TERM GOAL #8   Title Patient will increase Berg Balance score by > 6 points (38/56) to demonstrate decreased fall  risk during functional activities.    Baseline 9/13: 31/56 7/14: 33/56 07/28/19: 32/56 6/21: 32/56     Time 12    Period Weeks    Status Partially Met    Target Date 04/10/20                 Plan - 03/27/20 1419    Clinical Impression Statement Patient presents with excellent motivation throughout physical therapy session. Requires occasional rest breaks throughout session for fatigue. He does demonstrate increased episodes of posterior LOB as he fatigues. The patient continues to benefit from additional skilled PT intervention to improve right LE strength and improve overall endurance for improved walking tolerance and to decrease fall risk    Rehab Potential Poor    Clinical Impairments Affecting Rehab Potential hx of HTN, HLD, CVA, learning disability, Diabetes, brain tumor,     PT Frequency 2x / week    PT Duration 12 weeks    PT Treatment/Interventions ADLs/Self Care Home Management;Aquatic Therapy;Ultrasound;Moist Heat;Traction;DME Instruction;Gait training;Stair training;Functional mobility training;Therapeutic activities;Therapeutic exercise;Orthotic Fit/Training;Neuromuscular re-education;Balance training;Patient/family education;Manual techniques;Wheelchair mobility training;Passive range of motion;Energy conservation;Taping;Visual/perceptual remediation/compensation    PT Next Visit Plan progress standing balance    PT Home Exercise Plan TrA contraction, glute  sets: needs updates. Potentially some time in supine flat in hospital bed, to improve hip extension deficits.     Consulted and Agree with Plan of Care Patient    Family Member Consulted mother           Patient will benefit from skilled therapeutic intervention in order to improve the following deficits and impairments:  Abnormal gait,Decreased activity tolerance,Decreased balance,Decreased knowledge of precautions,Decreased endurance,Decreased coordination,Decreased knowledge of use of DME,Decreased  mobility,Decreased range of motion,Difficulty walking,Decreased safety awareness,Decreased strength,Impaired flexibility,Impaired perceived functional ability,Impaired tone,Postural dysfunction,Improper body mechanics,Pain  Visit Diagnosis: Muscle weakness (generalized)  Other lack of coordination  Other abnormalities of gait and mobility  Left-sided weakness     Problem List Patient Active Problem List   Diagnosis Date Noted  . Impaired glucose tolerance 03/23/2020  . Learning disability 03/23/2020  . Urinary and fecal incontinence 06/14/2019  . Chronic pain syndrome 03/23/2019  . Constipation 03/23/2019  . Dysarthria 03/23/2019  . Dysphagia 03/23/2019  . Learning difficulty 03/23/2019  . Morbid obesity ([place]) 03/23/2019  . Muscle weakness 03/23/2019  . Vitamin D deficiency 03/23/2019  . Bilateral carotid artery stenosis 12/15/2018  . Moderate aortic valve stenosis 12/15/2018  . BMI 45.0-49.9, adult ([place]) 11/10/2018  . Cerebrovascular accident ([place]) 05/02/2016  . Hemiparesis affecting right side as late effect of cerebrovascular accident ([place]) 05/01/2016  . Brain tumor ([place]) 11/24/2014  . Gastro-esophageal reflux disease without esophagitis 11/24/2014  . Accident due to mechanical fall without injury 08/03/2014  . Essential hypertension 06/13/2014  . Mixed hyperlipidemia 06/13/2014  . Unspecified sequelae of cerebral infarction 06/13/2014  . 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/2014  . Type 2 diabetes mellitus without complication ([place]) 82/09/154  . Neck mass 09/08/2013  . Swimmer's ear 09/08/2013  . Erectile dysfunction 01/03/2013  . Prediabetes 01/03/2013   Janna Arch, PT, DPT   03/27/2020, 2:29 PM  [place] MAIN [facility] SERVICES [address] [place], [place], [address] Phone: [phone]   Fax:  [phone]  Name: Curtis Powell MRN: [id number] Date of Birth: 1965-09-15

## 2020-03-29 ENCOUNTER — Encounter: Payer: Self-pay | Admitting: Occupational Therapy

## 2020-03-29 ENCOUNTER — Ambulatory Visit: Payer: Medicare HMO

## 2020-03-29 ENCOUNTER — Ambulatory Visit: Payer: Medicare HMO | Admitting: Occupational Therapy

## 2020-03-29 ENCOUNTER — Other Ambulatory Visit: Payer: Self-pay

## 2020-03-29 DIAGNOSIS — M6281 Muscle weakness (generalized): Secondary | ICD-10-CM

## 2020-03-29 DIAGNOSIS — R278 Other lack of coordination: Secondary | ICD-10-CM

## 2020-03-29 DIAGNOSIS — R531 Weakness: Secondary | ICD-10-CM

## 2020-03-29 DIAGNOSIS — R2689 Other abnormalities of gait and mobility: Secondary | ICD-10-CM

## 2020-03-29 NOTE — Therapy (Signed)
[place] MAIN [facility] SERVICES [address] [place], [place], [address] Phone: [phone]   Fax:  [phone]  Physical Therapy Treatment  Patient Details  Name: Curtis Powell MRN: [id number] Date of Birth: 1966/01/13 No data recorded  Encounter Date: 03/29/2020   PT End of Session - 03/29/20 1458    Visit Number [id number]    Number of Visits 277    Date for PT Re-Evaluation 04/10/20    Authorization Type [id number] PN 03/01/20    PT Start Time 1432    PT Stop Time 1515    PT Time Calculation (min) 43 min    Equipment Utilized During Treatment Gait belt    Activity Tolerance Patient tolerated treatment well;No increased pain    Behavior During Therapy WFL for tasks assessed/performed           Past Medical History:  Diagnosis Date  . GERD (gastroesophageal reflux disease)   . Hyperlipidemia   . Hypertension   . Obesity   . Stroke [facility])     Past Surgical History:  Procedure Laterality Date  . BRAIN SURGERY      There were no vitals filed for this visit.   Subjective Assessment - 03/29/20 1457    Subjective Patient reports compliance with HEP, no falls or LOB since last session. Is excited for christmas and got his bed fixed.    Pertinent History  Patient is a pleasant 54 year old male who presents to physical therapy for weakness and immobility secondary to CVA.  Had a stroke in Feb 2018. He was previously fully independent, but this stroke caused severe residual deficits, mainly on the right side as well as speech, and now he is unable to walk or perform most of his ADLs on his own. Entire right side is very weak. He still has a little difficulty with speech but his swallowing is improved to baseline. His mom had to move in with him and now is his main caregiver.     Limitations Reading    How long can you sit comfortably? 5 minutes    How long can you stand comfortably? 5 minutes    How long can you walk comfortably? 8 minutes     Patient Stated Goals Walk without walker, walk further with walker, get some strength in back     Currently in Pain? No/denies               Gait: Patient ambulates 140 ft with RW, CGA, and Wheelchair follow for safety. Patient requires min verbal cues for proximity to walker throughout gait trial as well as upright posture and weight shift.  Improved gait mechanics and gait speed.      Standing with RW: (wheelchair behind)  5lb ankle weights: -marching with heavy UE support, 10x each LE, cues for increasing foot clearance bilaterally -hip extension 10x each LE ; min A to walker for stabilization.   Static stand no UE support 30 seconds Stand without UE support horizontal head turns 2x 30 second holds, three near LOB posterior due to fatigue.    Shadow box with close CGA, patient able to stabilize self and correct for LOB. X 2 minutes   Seated:  seated punching for coordination, core stabilization against pertubation's 3x 30 seconds with cross body jabs to moving targerts Seated soccer ball kicks for spatial awareness, sequencing of muscle activation x 10 each LE  balloon taps reaching inside/outside BOS without LOB x 2 minutes sit to  stand 10x with decreasing UE support  RTB df 10x, pf 10x    5lb ankle weight:  -LAQ 15x 3-5 second holds each LE,  -march with upright posture 15x each LE    Vitals monitored throughout session with rest breaks to return HR to therapeutic range.    Pt educated throughout session about proper posture and technique with exercises. Improved exercise technique, movement at target joints, use of target muscles after min to mod verbal, visual, tactile cues.                         PT Education - 03/29/20 1458    Education provided Yes    Education Details exercise technique, body mechanics    Person(s) Educated Patient    Methods Explanation;Demonstration;Tactile cues;Verbal cues    Comprehension Verbalized  understanding;Returned demonstration;Verbal cues required;Tactile cues required            PT Short Term Goals - 01/24/20 1711      PT SHORT TERM GOAL #1   Title Patient will be independent in home exercise program to improve strength/mobility for better functional independence with ADLs.    Baseline hep compliant    Time 2    Period Weeks    Status Achieved      PT SHORT TERM GOAL #2   Title Patient will require min cueing for STS transfer with CGA for increased independence with mobility.     Baseline CGA    Time 2    Period Weeks    Status Achieved      PT SHORT TERM GOAL #3   Title Patient will maintain upright posture for > 15 seconds to demonstrate strengthened postural control muscualture     Baseline 18 seconds     Time 2    Period Weeks    Status Achieved             PT Long Term Goals - 03/01/20 0001      PT LONG TERM GOAL #1   Title Patient will perform bed mobility (rolling and supine <>sit) with Mod I  to increase mobility and  perform ADLs.     Baseline 9/13: able to perform with mod I occasional Min A when very fatigued 7/14: Min A 4/21: unable to perform 6/15: improving but still min-mod A  11/24: min-modA, difficulty breathing with changing positions    Time 12    Period Weeks    Status Partially Met    Target Date 04/10/20      PT LONG TERM GOAL #2   Title Patient (< 57 years old) will complete five times sit to stand test in < 10 seconds indicating an increased LE strength and improved balance.    Baseline  9/13: 14 seconds with one near LOB 7/14: 14.31 seconds 6/21: 16.86 seconds 4/21: 18.8 seconds 06/02/19: 19.93 seconds one hand on RW, one hand on w/c 12/30: 16 secpmds BUE support 02/11/19: 36 seconds 5/19: 20.6s from [facility], one hand on walker and one on WC, 80% upright posture 7/1: 22 seconds one hand on walker one hand on w/c  9/14: 48 seconds with full upright posture 11/24: 25 seconds with full upright posture, one hand on 2WW    Time 12    Period  Weeks    Status Partially Met    Target Date 04/10/20      PT LONG TERM GOAL #3   Title Patient will negotiate a bathroom with a  walker and Min A for increased indep in home and external environment    Baseline  9/13: able to some in standing, requires sitting down to wash hands 7/14: unable to do  11/24: able to complete voiding in standing and requires sitting down to wash hands    Time 12    Period Weeks    Status Partially Met    Target Date 04/10/20      PT LONG TERM GOAL #4   Title Patient will increase BLE gross strength to 4+/5 as to improve functional strength for independent gait, increased standing tolerance and increased ADL ability.    Baseline  4/21: see note 06/02/19: hip flexion 4-/5, extension/abd 3/5 12/30: hip flexion 4/5 hip extension and abduction 3/5 02/11/19: hip flexion 4/5 hip extension and abduction 2+/54-/5 RLE, 10/15: 4/5 RLE 11/7: 4/5 RLE 3/3: 4/5 7/1:  hip flexion 4/5 hip extension 2/5 hip abduction/adduciton 2/5     Time 12    Period Weeks    Status Partially Met    Target Date 04/10/20      PT LONG TERM GOAL #6   Title Patient will ambulate 200 ft with least assistive device and Supervision with no breaks to allow for increased mobility within home.    Baseline 9/13: performed in previous session 7/14: 190 ft with RW 4/21: ambulate 150 ft 6/21: had to terminate after 90 ft due to cardiac  11/24: level surface with 2WW for 65 ft, limited due to fatigue and not feeling good today    Time 12    Period Weeks    Status Achieved    Target Date 04/11/19      PT LONG TERM GOAL #7   Title Patient will perform 10 MWT in <30 seconds for improved gait speed, gait mechanics, and functional capacity for mobility.     Baseline 10/11: 48s with BRW and WC follow; 9/13: terminated due to wardrobe malfunction 9/8: 42 seconds with R foot collapse 7/14: 37.98 seconds 6/21: 46.9 seconds with RW 07/28/19: 51 seconds with RW 06/02/19: 62.5 seconds with RW 11/4: 56 seconds with AFO  and RW 12/30: 53 seconds with AFO and RW     Time 12    Period Weeks    Status Partially Met    Target Date 04/10/20      PT LONG TERM GOAL #8   Title Patient will increase Berg Balance score by > 6 points (38/56) to demonstrate decreased fall risk during functional activities.    Baseline 9/13: 31/56 7/14: 33/56 07/28/19: 32/56 6/21: 32/56     Time 12    Period Weeks    Status Partially Met    Target Date 04/10/20                 Plan - 03/29/20 1520    Clinical Impression Statement Patient continues to demonstrate excellent motivation throughout physical therapy. Core activation continues to improve in seated and standing. Posterior LE chain activation is an area for focus with patient fatiguing quickly and resulting in compensatory trunk mechanisms. The patient continues to benefit from additional skilled PT intervention to improve right LE strength and improve overall endurance for improved walking tolerance and to decrease fall risk    Rehab Potential Poor    Clinical Impairments Affecting Rehab Potential hx of HTN, HLD, CVA, learning disability, Diabetes, brain tumor,     PT Frequency 2x / week    PT Duration 12 weeks    PT Treatment/Interventions ADLs/Self Care  Home Management;Aquatic Therapy;Ultrasound;Moist Heat;Traction;DME Instruction;Gait training;Stair training;Functional mobility training;Therapeutic activities;Therapeutic exercise;Orthotic Fit/Training;Neuromuscular re-education;Balance training;Patient/family education;Manual techniques;Wheelchair mobility training;Passive range of motion;Energy conservation;Taping;Visual/perceptual remediation/compensation    PT Next Visit Plan progress standing balance    PT Home Exercise Plan TrA contraction, glute sets: needs updates. Potentially some time in supine flat in hospital bed, to improve hip extension deficits.     Consulted and Agree with Plan of Care Patient    Family Member Consulted mother           Patient  will benefit from skilled therapeutic intervention in order to improve the following deficits and impairments:  Abnormal gait,Decreased activity tolerance,Decreased balance,Decreased knowledge of precautions,Decreased endurance,Decreased coordination,Decreased knowledge of use of DME,Decreased mobility,Decreased range of motion,Difficulty walking,Decreased safety awareness,Decreased strength,Impaired flexibility,Impaired perceived functional ability,Impaired tone,Postural dysfunction,Improper body mechanics,Pain  Visit Diagnosis: Muscle weakness (generalized)  Other lack of coordination  Other abnormalities of gait and mobility  Left-sided weakness     Problem List Patient Active Problem List   Diagnosis Date Noted  . Impaired glucose tolerance 03/23/2020  . Learning disability 03/23/2020  . Urinary and fecal incontinence 06/14/2019  . Chronic pain syndrome 03/23/2019  . Constipation 03/23/2019  . Dysarthria 03/23/2019  . Dysphagia 03/23/2019  . Learning difficulty 03/23/2019  . Morbid obesity ([place]) 03/23/2019  . Muscle weakness 03/23/2019  . Vitamin D deficiency 03/23/2019  . Bilateral carotid artery stenosis 12/15/2018  . Moderate aortic valve stenosis 12/15/2018  . BMI 45.0-49.9, adult ([place]) 11/10/2018  . Cerebrovascular accident ([place]) 05/02/2016  . Hemiparesis affecting right side as late effect of cerebrovascular accident ([place]) 05/01/2016  . Brain tumor ([place]) 11/24/2014  . Gastro-esophageal reflux disease without esophagitis 11/24/2014  . Accident due to mechanical fall without injury 08/03/2014  . Essential hypertension 06/13/2014  . Mixed hyperlipidemia 06/13/2014  . Unspecified sequelae of cerebral infarction 06/13/2014  . 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/2014  . Type 2 diabetes mellitus without complication ([place]) 17/49/4496  . Neck mass 09/08/2013  . Swimmer's ear 09/08/2013  . Erectile dysfunction 01/03/2013  . Prediabetes 01/03/2013   Janna Arch, PT, DPT   03/29/2020,  3:21 PM  [place] MAIN [facility] SERVICES [address] [place], [place], [address] Phone: [phone]   Fax:  [phone]  Name: Curtis Powell MRN: [id number] Date of Birth: November 16, 1965

## 2020-03-29 NOTE — Therapy (Signed)
[place] MAIN [facility] SERVICES [address] [place], [place], [address] Phone: [phone]   Fax:  [phone]  Occupational Therapy Treatment  Patient Details  Name: Curtis Powell MRN: [id number] Date of Birth: [birth year] No data recorded  Encounter Date: 03/29/2020   OT End of Session - 03/29/20 1359    Visit Number [id number]    Number of Visits 258    Date for OT Re-Evaluation 04/03/20    Authorization Type Progress reporting period starting 09/15/2019    OT Start Time 1345    OT Stop Time 1430    OT Time Calculation (min) 45 min    Activity Tolerance Patient tolerated treatment well    Behavior During Therapy [facility] for tasks assessed/performed           Past Medical History:  Diagnosis Date  . GERD (gastroesophageal reflux disease)   . Hyperlipidemia   . Hypertension   . Obesity   . Stroke [facility])     Past Surgical History:  Procedure Laterality Date  . BRAIN SURGERY      There were no vitals filed for this visit.   Subjective Assessment - 03/29/20 1357    Subjective  Pt. reports being sore in his groin over the weekend.    Patient is accompanied by: Family member    Pertinent History Pt. is a 54 y.o. male who suffered a CVA on 05/01/2016. Pt. was admitted to the hospital. Once discharged, he received Home Health PT and OT services for about a month. Pt. has had multiple CVAs over the past 8 years, and has had multiple falls in the past 6 months. Pt. resides in an apartment. Pt. has caregivers for 80 hours. Pt.'s mother stays with pt. at night, and assists with IADL tasks.    Currently in Pain? No/denies          OT TREATMENT    Therapeutic Exercise:  Pt. worked on Occupational psychologist with his right hand coloring in a holiday design. Pt. was able to trace the designs, and and accurately fill in both thin, and wide spaces with moderate deviation over the design pattern.  Pt. reports no pain today. Pt. required increased time  to work on the task. Pt. Was able to attend to the task, and maintain a mature grasp on the pen for the entire duration of the task. Pt. Presented with moderate deviation from the lines on the design. Pt. Continues to work on Armed forces training and education officer, and [facility] skills in order to improve, and maximize independence with ADLs, and IADLs.                     OT Education - 03/29/20 1359    Person(s) Educated Patient    Methods Explanation;Demonstration    Comprehension Verbalized understanding;Returned demonstration;Verbal cues required            OT Short Term Goals - 03/03/17 1459      OT SHORT TERM GOAL #1   Title `             OT Long Term Goals - 03/22/20 1400      OT LONG TERM GOAL #1   Title Pt. will UE functioning to be able to perfrom toilet hygiene with minimal assistance. (P)     Baseline 01/14/2018 - Pt completes toileting hygiene independently. Pt requires assistance managing LB clothing. (P)     Time 12 (P)     Period Weeks (P)  Status Achieved (P)       OT LONG TERM GOAL #2   Title Pt. will complete self grooming with minA (P)     Baseline 02/24/2018 - Pt is independent with oral care and washing his face and occasionally requires increased time if he is having a bad day. Pt's shaving is completed by his barber. (P)     Time 12 (P)     Period Weeks (P)     Status Achieved (P)       OT LONG TERM GOAL #3   Title Pt. will perform self-feeding skills with modified indepndence (P)     Baseline 01/14/2018 - Pt is able to complete self-feeding independently with minimal spillage. Pt is unable to cut meat at this time. (P)     Time 12 (P)     Period Weeks (P)     Status Achieved (P)       OT LONG TERM GOAL #4   Title Pt. will perform self dressing with minA and A/E as needed. (P)     Baseline Pt. continues to be able to donn a T shirt independently with increased time. MinA donning a jacket, Mod A donning pants over his feet, and socks, minA with  shoes. (P)     Time 12 (P)     Period Weeks (P)     Status On-going (P)     Target Date 06/14/20 (P)       OT LONG TERM GOAL #5   Title Pt. will perform light home making tasks with minA (P)     Baseline Pt is able to wash dishes while seated in w/c with increased accessibility of new apartment. Pt. is able to fold his clothes, his moms puts them in the and out of the washer, and dryer, and puts them onto hangers. Caregivers perform vacuuming, and dusting. (P)     Time 12 (P)     Period Weeks (P)     Status Achieved (P)       OT LONG TERM GOAL #6   Title Pt. will write his name efficiently with 100% legibility (P)     Baseline Pt. continues to present with 75% legibility with increased time required when printing his name, and 50% legibility when cursive writing his name to sign in when riding the van (P)     Time 12 (P)     Period Weeks (P)     Status On-going (P)     Target Date 06/14/20 (P)       OT LONG TERM GOAL #7   Title Pt will independently and consistently follow HEP to increase UE strength to increase functional independence (P)     Baseline Pt. continues to work towards independence with exercises (P)     Time 12 (P)     Period Weeks (P)     Status On-going (P)     Target Date 06/14/20 (P)       OT LONG TERM GOAL #8   Title Pt. will require supervision ironing a shirt. (P)     Baseline Pt. has progressed CGA, and complete set-up seated using a tabletop iron (P)     Time 12 (P)     Period Weeks (P)     Status Partially Met (P)       OT LONG TERM GOAL  #9   Baseline Pt. will independently be able to sew a button onto a shirt. (P)     Time  12 (P)     Period Weeks (P)     Status On-going (P)     Target Date 06/14/20 (P)       OT LONG TERM GOAL  #10   TITLE Pt. will independently be able to throw a ball (P)     Baseline Pt. continues to be limited with throwing overhand, however pt. is able to perform underhand throwing. (P)     Time 12 (P)     Period Weeks (P)      Status On-going (P)     Target Date 06/14/20 (P)       OT LONG TERM GOAL  #11   TITLE Pt. will improve UE functional reaching to be able to independently use his RUE to hand his clothes in the closest. (P)     Baseline Pt. continues to improve with reaching up with the RUE, however has difficulty reaching the hanger rack in the closet. Pt. now uses a reacher. (P)     Time 12 (P)     Period Weeks (P)     Status On-going (P)     Target Date 06/14/20 (P)       OT LONG TERM GOAL  #12   TITLE Pt. will  be independent with shaving using an electric razer. (P)     Baseline Pt. is able to use the electric razer independently, however is unable to access the position of the plug and requires assist form caregivers secondary to the size of the bathroom, and location of the plug. (P)     Time 12 (P)     Period Weeks (P)     Status Achieved (P)       OT LONG TERM GOAL  #13   TITLE Pt. will increase BUE strength to be able to prepare for assisting with yardwork. (P)     Baseline Pt. BUE strength conitnues to be 4+/5 Pt. reports that he has been able to get out to the front. (P)     Time 12 (P)     Period Weeks (P)     Status On-going (P)       OT LONG TERM GOAL  #14   TITLE Pt. will improve activity toelrance in standing to be able to complete standing ADLs, and IADLs with SBA. (P)     Baseline Pt. is improving with standing tolerance, however is still limited with ADL performance in standing. (P)     Time 12 (P)     Period Weeks (P)     Status On-going (P)                  Plan - 03/29/20 1400    Clinical Impression Statement Pt. reports no pain today. Pt. required increased time to work on the task. Pt. Was able to attend to the task, and maintain a mature grasp on the pen for the entire duration of the task. Pt. Presented with moderate deviation from the lines on the design. Pt. Continues to work on Armed forces training and education officer, and [facility] skills in order to improve, and maximize  independence with ADLs, and IADLs.   OT Occupational Profile and History Problem Focused Assessment - Including review of records relating to presenting problem    Occupational performance deficits (Please refer to evaluation for details): ADL's;IADL's    Body Structure / Function / Physical Skills ADL;FMC;ROM;IADL;Pain;Coordination    Rehab Potential Fair    Clinical Decision Making Several treatment options, min-mod task modification  necessary    Comorbidities Affecting Occupational Performance: May have comorbidities impacting occupational performance    Modification or Assistance to Complete Evaluation  Min-Moderate modification of tasks or assist with assess necessary to complete eval    OT Frequency 2x / week    OT Duration 12 weeks    OT Treatment/Interventions Self-care/ADL training;Patient/family education;DME and/or AE instruction;Therapeutic exercise;Neuromuscular education    Consulted and Agree with Plan of Care Patient           Patient will benefit from skilled therapeutic intervention in order to improve the following deficits and impairments:   Body Structure / Function / Physical Skills: ADL,FMC,ROM,IADL,Pain,Coordination       Visit Diagnosis: Muscle weakness (generalized)  Other lack of coordination    Problem List Patient Active Problem List   Diagnosis Date Noted  . Impaired glucose tolerance 03/23/2020  . Learning disability 03/23/2020  . Urinary and fecal incontinence 06/14/2019  . Chronic pain syndrome 03/23/2019  . Constipation 03/23/2019  . Dysarthria 03/23/2019  . Dysphagia 03/23/2019  . Learning difficulty 03/23/2019  . Morbid obesity ([place]) 03/23/2019  . Muscle weakness 03/23/2019  . Vitamin D deficiency 03/23/2019  . Bilateral carotid artery stenosis 12/15/2018  . Moderate aortic valve stenosis 12/15/2018  . BMI 45.0-49.9, adult ([place]) 11/10/2018  . Cerebrovascular accident ([place]) 05/02/2016  . Hemiparesis affecting right side as late  effect of cerebrovascular accident ([place]) 05/01/2016  . Brain tumor ([place]) 11/24/2014  . Gastro-esophageal reflux disease without esophagitis 11/24/2014  . Accident due to mechanical fall without injury 08/03/2014  . Essential hypertension 06/13/2014  . Mixed hyperlipidemia 06/13/2014  . Unspecified sequelae of cerebral infarction 06/13/2014  . 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/2014  . Type 2 diabetes mellitus without complication ([place]) 37/35/7897  . Neck mass 09/08/2013  . Swimmer's ear 09/08/2013  . Erectile dysfunction 01/03/2013  . Prediabetes 01/03/2013    Harrel Carina, MS, OTR/L 03/29/2020, 2:02 PM  [place] MAIN [facility] SERVICES [address] [place], [place], [address] Phone: [phone]   Fax:  [phone]  Name: Curtis Powell MRN: [id number] Date of Birth: 1965/08/28

## 2020-04-03 ENCOUNTER — Encounter: Payer: Medicare HMO | Admitting: Occupational Therapy

## 2020-04-03 ENCOUNTER — Ambulatory Visit: Payer: Medicare HMO

## 2020-04-05 ENCOUNTER — Ambulatory Visit: Payer: Medicare HMO

## 2020-04-05 ENCOUNTER — Other Ambulatory Visit: Payer: Self-pay

## 2020-04-05 ENCOUNTER — Ambulatory Visit: Payer: Medicare HMO | Admitting: Occupational Therapy

## 2020-04-05 ENCOUNTER — Encounter: Payer: Self-pay | Admitting: Occupational Therapy

## 2020-04-05 DIAGNOSIS — M6281 Muscle weakness (generalized): Secondary | ICD-10-CM

## 2020-04-05 DIAGNOSIS — R278 Other lack of coordination: Secondary | ICD-10-CM

## 2020-04-05 DIAGNOSIS — R2689 Other abnormalities of gait and mobility: Secondary | ICD-10-CM

## 2020-04-05 NOTE — Therapy (Signed)
[place] MAIN [facility] SERVICES [address] [place], [place], [address] Phone: [phone]   Fax:  [phone]  Occupational Therapy Treatment/Recertification  Patient Details  Name: Curtis Powell MRN: [id number] Date of Birth: 09-Mar-1966 No data recorded  Encounter Date: 04/05/2020   OT End of Session - 04/05/20 1447    Visit Number [id number]    Number of Visits 258    Date for OT Re-Evaluation 06/28/20    Authorization Type Progress reporting period starting 03/27/2020    OT Start Time 1433    OT Stop Time 1515    OT Time Calculation (min) 42 min    Activity Tolerance Patient tolerated treatment well    Behavior During Therapy [facility] for tasks assessed/performed           Past Medical History:  Diagnosis Date  . GERD (gastroesophageal reflux disease)   . Hyperlipidemia   . Hypertension   . Obesity   . Stroke [facility])     Past Surgical History:  Procedure Laterality Date  . BRAIN SURGERY      There were no vitals filed for this visit.   Subjective Assessment - 04/05/20 1446    Subjective  Pt. with no changes since previous session.    Patient is accompanied by: Family member    Pertinent History Pt. is a 54 y.o. male who suffered a CVA on 05/01/2016. Pt. was admitted to the hospital. Once discharged, he received Home Health PT and OT services for about a month. Pt. has had multiple CVAs over the past 8 years, and has had multiple falls in the past 6 months. Pt. resides in an apartment. Pt. has caregivers for 80 hours. Pt.'s mother stays with pt. at night, and assists with IADL tasks.    Patient Stated Goals To be able to throw a ball and dribble a ball, do as much as I can for myself.     Currently in Pain? No/denies           Pt. Has made steady progress with BUE strength. Pt. Has improved with functional reaching, and Is now able to reach up to retrieve a shirt from his closet. Pt. continues to work on improving BUE strength to be  able to push up into standing, and perform yardwork. Pt. Continues to work on improving coordination skills in order to improve writing legibility, throwing a ball, and sewing a button onto his shirt, as well as improving standing IADL tasks. Goals have been reviewed. Pt. Is now appropriate for a change of frequency from 2x's a week to 1x a week for this next recertification period.                      OT Education - 04/05/20 1446    Education provided Yes    Education Details grip exercises, strengthening    Person(s) Educated Patient    Methods Explanation;Demonstration    Comprehension Verbalized understanding;Returned demonstration;Verbal cues required            OT Short Term Goals - 03/03/17 1459      OT SHORT TERM GOAL #1   Title `             OT Long Term Goals - 04/05/20 1400      OT LONG TERM GOAL #1   Title Pt. will UE functioning to be able to perfrom toilet hygiene with minimal assistance.    Baseline 01/14/2018 - Pt completes toileting  hygiene independently. Pt requires assistance managing LB clothing.    Time 12    Period Weeks    Status Achieved      OT LONG TERM GOAL #2   Title Pt. will complete self grooming with minA    Baseline 02/24/2018 - Pt is independent with oral care and washing his face and occasionally requires increased time if he is having a bad day. Pt's shaving is completed by his barber.    Time 12    Period Weeks    Status Achieved      OT LONG TERM GOAL #3   Title Pt. will perform self-feeding skills with modified indepndence    Baseline 01/14/2018 - Pt is able to complete self-feeding independently with minimal spillage. Pt is unable to cut meat at this time.    Time 12    Period Weeks    Status Achieved      OT LONG TERM GOAL #4   Title Pt. will perform self dressing with minA and A/E as needed.    Baseline Pt. requires [place] LE dressing. Pt. has difficulty with LE the right LE secondary to AFO. Pt. continues to be  able to donn a T shirt independently with increased time. MinA donning a jacket, Mod A donning pants over his feet, and socks, minA with shoes.    Time 12    Period Weeks    Status On-going    Target Date 06/27/20      OT LONG TERM GOAL #5   Title Pt. will perform light home making tasks with minA    Baseline Pt is able to wash dishes while seated in w/c with increased accessibility of new apartment. Pt. is able to fold his clothes, his moms puts them in the and out of the washer, and dryer, and puts them onto hangers. Caregivers perform vacuuming, and dusting.    Time 12    Period Weeks    Status Achieved      OT LONG TERM GOAL #6   Title Pt. will write his name efficiently with 100% legibility    Baseline Pt. continues to present with 755 legibility with printed form, and 50% legibility with cursive form. Pt. continues to present with 75% legibility with increased time required when printing his name, and 50% legibility when cursive writing his name to sign in when riding the [place]    Time 12    Period Weeks    Status On-going    Target Date 06/27/20      OT LONG TERM GOAL #7   Title Pt will independently and consistently follow HEP to increase UE strength to increase functional independence    Baseline Pt. continues to work towards independence with exercises    Time 12    Period Weeks    Status On-going    Target Date 06/27/20      OT LONG TERM GOAL #8   Title Pt. will require supervision ironing a shirt.    Baseline Pt. has progressed CGA, and complete set-up seated using a tabletop iron    Time 12    Period Weeks    Status Partially Met    Target Date 06/27/20      OT LONG TERM GOAL  #9   Baseline Pt. will independently be able to sew a button onto a shirt.    Time 12    Period Weeks    Status On-going    Target Date 06/27/20  OT LONG TERM GOAL  #10   TITLE Pt. will independently be able to throw a ball    Baseline Pt. continues to be limited with throwing  overhand, however pt. is able to perform underhand throwing.    Time 12    Period Weeks    Status On-going    Target Date 06/27/20      OT LONG TERM GOAL  #11   TITLE Pt. will improve UE functional reaching to be able to independently use his RUE to hand his clothes in the closest.    Baseline Pt. uses a reacher to complete. Pt. continues to improve with reaching up with the RUE, however has difficulty reaching the hanger rack in the closet. Pt. now uses a reacher.    Time 12    Period Weeks    Status On-going    Target Date 06/27/20      OT LONG TERM GOAL  #12   TITLE Pt. will  be independent with shaving using an electric razer.    Baseline Pt. is able to use the electric razer independently, however is unable to access the position of the plug and requires assist form caregivers secondary to the size of the bathroom, and location of the plug.    Time 12    Period Weeks    Status Achieved      OT LONG TERM GOAL  #13   TITLE Pt. will increase BUE strength to be able to prepare for assisting with yardwork.    Baseline Pt. BUE strength conitnues to be 4+/5 Pt. reports that he has been able to get out to the front.    Time 12    Period Weeks    Status On-going      OT LONG TERM GOAL  #14   TITLE Pt. will improve activity toelrance in standing to be able to complete standing ADLs, and IADLs with SBA.    Baseline Pt. is limited with ADL performance in standing.    Time 12    Period Weeks    Status On-going    Target Date 06/27/20                 Plan - 04/05/20 1447    Clinical Impression Statement Pt. Has made steady progress with BUE strength. Pt. Has improved with functional reaching, and Is now able to reach up to retrieve a shirt from his closet. Pt. continues to work on improving BUE strength to be able to push up into standing, and perform yardwork. Pt. Continues to work on improving coordination skills in order to improve writing legibility, throwing a ball, and  sewing a button onto his shirt, as well as improving standing IADL tasks. Goals have been reviewed. Pt. Is now appropriate for a change of frequency from 2x's a week to 1x a week for this next recertification period.      OT Occupational Profile and History Problem Focused Assessment - Including review of records relating to presenting problem    Occupational performance deficits (Please refer to evaluation for details): ADL's;IADL's    Body Structure / Function / Physical Skills ADL;FMC;ROM;IADL;Pain;Coordination    Rehab Potential Fair    Clinical Decision Making Several treatment options, min-mod task modification necessary    Comorbidities Affecting Occupational Performance: May have comorbidities impacting occupational performance    Modification or Assistance to Complete Evaluation  Min-Moderate modification of tasks or assist with assess necessary to complete eval    OT Frequency 1x /  week    OT Duration 12 weeks    OT Treatment/Interventions Self-care/ADL training;Patient/family education;DME and/or AE instruction;Therapeutic exercise;Neuromuscular education    Plan continue with current OT plan of care. Pt would like to continue to get stronger and be more independent.    OT Home Exercise Plan grip strengthening    Consulted and Agree with Plan of Care Patient           Patient will benefit from skilled therapeutic intervention in order to improve the following deficits and impairments:   Body Structure / Function / Physical Skills: ADL,FMC,ROM,IADL,Pain,Coordination       Visit Diagnosis: Muscle weakness (generalized)    Problem List Patient Active Problem List   Diagnosis Date Noted  . Impaired glucose tolerance 03/23/2020  . Learning disability 03/23/2020  . Urinary and fecal incontinence 06/14/2019  . Chronic pain syndrome 03/23/2019  . Constipation 03/23/2019  . Dysarthria 03/23/2019  . Dysphagia 03/23/2019  . Learning difficulty 03/23/2019  . Morbid obesity  ([place]) 03/23/2019  . Muscle weakness 03/23/2019  . Vitamin D deficiency 03/23/2019  . Bilateral carotid artery stenosis 12/15/2018  . Moderate aortic valve stenosis 12/15/2018  . BMI 45.0-49.9, adult ([place]) 11/10/2018  . Cerebrovascular accident ([place]) 05/02/2016  . Hemiparesis affecting right side as late effect of cerebrovascular accident ([place]) 05/01/2016  . Brain tumor ([place]) 11/24/2014  . Gastro-esophageal reflux disease without esophagitis 11/24/2014  . Accident due to mechanical fall without injury 08/03/2014  . Essential hypertension 06/13/2014  . Mixed hyperlipidemia 06/13/2014  . Unspecified sequelae of cerebral infarction 06/13/2014  . 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/2014  . Type 2 diabetes mellitus without complication ([place]) 37/94/3276  . Neck mass 09/08/2013  . Swimmer's ear 09/08/2013  . Erectile dysfunction 01/03/2013  . Prediabetes 01/03/2013    Harrel Carina, MS, OTR/L 04/05/2020, 6:10 PM  [place] MAIN [facility] SERVICES [address] [place], [place], [address] Phone: [phone]   Fax:  [phone]  Name: Curtis Powell MRN: [id number] Date of Birth: [birth year]

## 2020-04-05 NOTE — Therapy (Signed)
[place] MAIN [facility] SERVICES [address] [place], [place], [address] Phone: [phone]   Fax:  [phone]  Physical Therapy Treatment  Patient Details  Name: Curtis Powell MRN: [id number] Date of Birth: March 08, 1966 No data recorded  Encounter Date: 04/05/2020   PT End of Session - 04/05/20 1330    Visit Number [id number]    Number of Visits 277    Date for PT Re-Evaluation 04/10/20    Authorization Type 9/10 PN 03/01/20    PT Start Time 1445    PT Stop Time 1530    PT Time Calculation (min) 45 min    Equipment Utilized During Treatment Gait belt    Activity Tolerance Patient tolerated treatment well;No increased pain    Behavior During Therapy WFL for tasks assessed/performed           Past Medical History:  Diagnosis Date  . GERD (gastroesophageal reflux disease)   . Hyperlipidemia   . Hypertension   . Obesity   . Stroke [facility])     Past Surgical History:  Procedure Laterality Date  . BRAIN SURGERY      There were no vitals filed for this visit.   Subjective Assessment - 04/05/20 1328    Subjective Patient reports no falls since last session.  No new complaints.    Pertinent History  Patient is a pleasant 54 year old male who presents to physical therapy for weakness and immobility secondary to CVA.  Had a stroke in Feb 2018. He was previously fully independent, but this stroke caused severe residual deficits, mainly on the right side as well as speech, and now he is unable to walk or perform most of his ADLs on his own. Entire right side is very weak. He still has a little difficulty with speech but his swallowing is improved to baseline. His mom had to move in with him and now is his main caregiver.     Limitations Reading    How long can you sit comfortably? 5 minutes    How long can you stand comfortably? 5 minutes    How long can you walk comfortably? 8 minutes    Patient Stated Goals Walk without walker, walk further with  walker, get some strength in back     Currently in Pain? No/denies            Gait: Patient ambulates 2x85 ft with RW, CGA, and Wheelchair follow for safety. Patient requires min verbal cues for proximity to walker throughout gait trial as well as upright posture and weight shift.  Improved gait mechanics and gait speed.      Standing with RW: (wheelchair behind)  5lb ankle weights: -marching with heavy UE support, 10x each LE, cues for increasing foot clearance bilaterally -hip extension 10x each LE ; min A to walker for stabilization.    Static stand no UE support 30 seconds Stand without UE support horizontal head turns 2x 30 second holds, three near LOB posterior due to fatigue.       Seated: -Seated soccer ball kicks for spatial awareness, sequencing of muscle activation x 10 each LE -Balloon taps reaching inside/outside BOS without LOB x 2 minutes -sit to stand 10x with decreasing UE support     5lb ankle weight:  -LAQ 15x 3-5 second holds each LE,  -march with upright posture 15x each LE       PT Education - 04/05/20 1329    Education provided Yes  Education Details exercies technique, body mechanics    Person(s) Educated Patient    Methods Explanation;Demonstration;Tactile cues    Comprehension Returned demonstration;Verbalized understanding;Verbal cues required            PT Short Term Goals - 01/24/20 1711      PT SHORT TERM GOAL #1   Title Patient will be independent in home exercise program to improve strength/mobility for better functional independence with ADLs.    Baseline hep compliant    Time 2    Period Weeks    Status Achieved      PT SHORT TERM GOAL #2   Title Patient will require min cueing for STS transfer with CGA for increased independence with mobility.     Baseline CGA    Time 2    Period Weeks    Status Achieved      PT SHORT TERM GOAL #3   Title Patient will maintain upright posture for > 15 seconds to demonstrate strengthened  postural control muscualture     Baseline 18 seconds     Time 2    Period Weeks    Status Achieved             PT Long Term Goals - 03/01/20 0001      PT LONG TERM GOAL #1   Title Patient will perform bed mobility (rolling and supine <>sit) with Mod I  to increase mobility and  perform ADLs.     Baseline 9/13: able to perform with mod I occasional Min A when very fatigued 7/14: Min A 4/21: unable to perform 6/15: improving but still min-mod A  11/24: min-modA, difficulty breathing with changing positions    Time 12    Period Weeks    Status Partially Met    Target Date 04/10/20      PT LONG TERM GOAL #2   Title Patient (< 40 years old) will complete five times sit to stand test in < 10 seconds indicating an increased LE strength and improved balance.    Baseline  9/13: 14 seconds with one near LOB 7/14: 14.31 seconds 6/21: 16.86 seconds 4/21: 18.8 seconds 06/02/19: 19.93 seconds one hand on RW, one hand on w/c 12/30: 16 secpmds BUE support 02/11/19: 36 seconds 5/19: 20.6s from [facility], one hand on walker and one on WC, 80% upright posture 7/1: 22 seconds one hand on walker one hand on w/c  9/14: 48 seconds with full upright posture 11/24: 25 seconds with full upright posture, one hand on 2WW    Time 12    Period Weeks    Status Partially Met    Target Date 04/10/20      PT LONG TERM GOAL #3   Title Patient will negotiate a bathroom with a walker and Min A for increased indep in home and external environment    Baseline  9/13: able to some in standing, requires sitting down to wash hands 7/14: unable to do  11/24: able to complete voiding in standing and requires sitting down to wash hands    Time 12    Period Weeks    Status Partially Met    Target Date 04/10/20      PT LONG TERM GOAL #4   Title Patient will increase BLE gross strength to 4+/5 as to improve functional strength for independent gait, increased standing tolerance and increased ADL ability.    Baseline  4/21: see note  06/02/19: hip flexion 4-/5, extension/abd 3/5 12/30: hip flexion 4/5  hip extension and abduction 3/5 02/11/19: hip flexion 4/5 hip extension and abduction 2+/54-/5 RLE, 10/15: 4/5 RLE 11/7: 4/5 RLE 3/3: 4/5 7/1:  hip flexion 4/5 hip extension 2/5 hip abduction/adduciton 2/5     Time 12    Period Weeks    Status Partially Met    Target Date 04/10/20      PT LONG TERM GOAL #6   Title Patient will ambulate 200 ft with least assistive device and Supervision with no breaks to allow for increased mobility within home.    Baseline 9/13: performed in previous session 7/14: 190 ft with RW 4/21: ambulate 150 ft 6/21: had to terminate after 90 ft due to cardiac  11/24: level surface with 2WW for 65 ft, limited due to fatigue and not feeling good today    Time 12    Period Weeks    Status Achieved    Target Date 04/11/19      PT LONG TERM GOAL #7   Title Patient will perform 10 MWT in <30 seconds for improved gait speed, gait mechanics, and functional capacity for mobility.     Baseline 10/11: 48s with BRW and WC follow; 9/13: terminated due to wardrobe malfunction 9/8: 42 seconds with R foot collapse 7/14: 37.98 seconds 6/21: 46.9 seconds with RW 07/28/19: 51 seconds with RW 06/02/19: 62.5 seconds with RW 11/4: 56 seconds with AFO and RW 12/30: 53 seconds with AFO and RW     Time 12    Period Weeks    Status Partially Met    Target Date 04/10/20      PT LONG TERM GOAL #8   Title Patient will increase Berg Balance score by > 6 points (38/56) to demonstrate decreased fall risk during functional activities.    Baseline 9/13: 31/56 7/14: 33/56 07/28/19: 32/56 6/21: 32/56     Time 12    Period Weeks    Status Partially Met    Target Date 04/10/20                 Plan - 04/05/20 1333    Clinical Impression Statement Pt was able to amb 2x85 ft today with RW and PT CGA.  He was challenged with soccer ball kicks using R LE as timing for kick was not always accurate.  Overall he tolerated session well.     Rehab Potential Poor    Clinical Impairments Affecting Rehab Potential hx of HTN, HLD, CVA, learning disability, Diabetes, brain tumor,     PT Frequency 2x / week    PT Duration 12 weeks    PT Treatment/Interventions ADLs/Self Care Home Management;Aquatic Therapy;Ultrasound;Moist Heat;Traction;DME Instruction;Gait training;Stair training;Functional mobility training;Therapeutic activities;Therapeutic exercise;Orthotic Fit/Training;Neuromuscular re-education;Balance training;Patient/family education;Manual techniques;Wheelchair mobility training;Passive range of motion;Energy conservation;Taping;Visual/perceptual remediation/compensation    PT Next Visit Plan progress standing balance    PT Home Exercise Plan TrA contraction, glute sets: needs updates. Potentially some time in supine flat in hospital bed, to improve hip extension deficits.     Consulted and Agree with Plan of Care Patient    Family Member Consulted mother           Patient will benefit from skilled therapeutic intervention in order to improve the following deficits and impairments:  Abnormal gait,Decreased activity tolerance,Decreased balance,Decreased knowledge of precautions,Decreased endurance,Decreased coordination,Decreased knowledge of use of DME,Decreased mobility,Decreased range of motion,Difficulty walking,Decreased safety awareness,Decreased strength,Impaired flexibility,Impaired perceived functional ability,Impaired tone,Postural dysfunction,Improper body mechanics,Pain  Visit Diagnosis: Muscle weakness (generalized)  Other lack of coordination  Other abnormalities  of gait and mobility     Problem List Patient Active Problem List   Diagnosis Date Noted  . Impaired glucose tolerance 03/23/2020  . Learning disability 03/23/2020  . Urinary and fecal incontinence 06/14/2019  . Chronic pain syndrome 03/23/2019  . Constipation 03/23/2019  . Dysarthria 03/23/2019  . Dysphagia 03/23/2019  . Learning  difficulty 03/23/2019  . Morbid obesity ([place]) 03/23/2019  . Muscle weakness 03/23/2019  . Vitamin D deficiency 03/23/2019  . Bilateral carotid artery stenosis 12/15/2018  . Moderate aortic valve stenosis 12/15/2018  . BMI 45.0-49.9, adult ([place]) 11/10/2018  . Cerebrovascular accident ([place]) 05/02/2016  . Hemiparesis affecting right side as late effect of cerebrovascular accident ([place]) 05/01/2016  . Brain tumor ([place]) 11/24/2014  . Gastro-esophageal reflux disease without esophagitis 11/24/2014  . Accident due to mechanical fall without injury 08/03/2014  . Essential hypertension 06/13/2014  . Mixed hyperlipidemia 06/13/2014  . Unspecified sequelae of cerebral infarction 06/13/2014  . 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/2014  . Type 2 diabetes mellitus without complication ([place]) 29/05/1113  . Neck mass 09/08/2013  . Swimmer's ear 09/08/2013  . Erectile dysfunction 01/03/2013  . Prediabetes 01/03/2013    Pincus Badder 04/05/2020, 3:47 PM Merdis Delay, PT, DPT Physical Therapist - [facility]  Outpatient Physical Therapy- [place] MAIN [facility] SERVICES [address] [place], [place], [address] Phone: [phone]   Fax:  [phone]  Name: Curtis Powell MRN: [id number] Date of Birth: 07/23/1965

## 2020-04-10 ENCOUNTER — Encounter: Payer: Self-pay | Admitting: Occupational Therapy

## 2020-04-10 ENCOUNTER — Ambulatory Visit: Payer: Medicare HMO | Admitting: Occupational Therapy

## 2020-04-10 ENCOUNTER — Ambulatory Visit: Payer: Medicare HMO | Attending: Family Medicine

## 2020-04-10 ENCOUNTER — Other Ambulatory Visit: Payer: Self-pay

## 2020-04-10 DIAGNOSIS — M6281 Muscle weakness (generalized): Secondary | ICD-10-CM | POA: Insufficient documentation

## 2020-04-10 DIAGNOSIS — R278 Other lack of coordination: Secondary | ICD-10-CM

## 2020-04-10 DIAGNOSIS — R531 Weakness: Secondary | ICD-10-CM | POA: Diagnosis present

## 2020-04-10 DIAGNOSIS — I639 Cerebral infarction, unspecified: Secondary | ICD-10-CM | POA: Diagnosis present

## 2020-04-10 DIAGNOSIS — R2689 Other abnormalities of gait and mobility: Secondary | ICD-10-CM | POA: Diagnosis present

## 2020-04-10 DIAGNOSIS — I69351 Hemiplegia and hemiparesis following cerebral infarction affecting right dominant side: Secondary | ICD-10-CM | POA: Diagnosis present

## 2020-04-10 NOTE — Therapy (Signed)
[place] MAIN [facility] SERVICES [address] [place], [place], [address] Phone: [phone]   Fax:  [phone]  Occupational Therapy Treatment  Patient Details  Name: Curtis Powell MRN: [id number] Date of Birth: Oct 16, 1965 No data recorded  Encounter Date: 04/10/2020   OT End of Session - 04/10/20 1442    Visit Number [id number]    Number of Visits 258    Date for OT Re-Evaluation 06/28/20    Authorization Type Progress reporting period starting 03/27/2020    OT Start Time 1435    OT Stop Time 1515    OT Time Calculation (min) 40 min    Activity Tolerance Patient tolerated treatment well    Behavior During Therapy [facility] for tasks assessed/performed           Past Medical History:  Diagnosis Date  . GERD (gastroesophageal reflux disease)   . Hyperlipidemia   . Hypertension   . Obesity   . Stroke [facility])     Past Surgical History:  Procedure Laterality Date  . BRAIN SURGERY      There were no vitals filed for this visit.   Subjective Assessment - 04/10/20 1440    Subjective  Pt. reports that he is doing well today.    Patient is accompanied by: Family member    Pertinent History Pt. is a 55 y.o. male who suffered a CVA on 05/01/2016. Pt. was admitted to the hospital. Once discharged, he received Home Health PT and OT services for about a month. Pt. has had multiple CVAs over the past 8 years, and has had multiple falls in the past 6 months. Pt. resides in an apartment. Pt. has caregivers for 80 hours. Pt.'s mother stays with pt. at night, and assists with IADL tasks.    Patient Stated Goals To be able to throw a ball and dribble a ball, do as much as I can for myself.     Currently in Pain? No/denies          OT TREATMENT   Therapeutic Exercise:  Pt. worked on Occupational psychologist with his right hand coloring in a holiday design. Pt. was able to trace the designs, and and accurately fill in both thin, and wide spaces with moderate  deviation over the design pattern.  Pt. reports having had a nice New Years Holiday. Pt. required increased time to work on the task. Pt. was able to attend to the task, and maintain a mature grasp on the pen for the entire duration of the task. Pt. Presented with moderate deviation from the lines on the design. Pt. Continues to work on Armed forces training and education officer, and [facility] skills in order to improve, and maximize independence with ADLs, and IADLs.                        OT Education - 04/10/20 1441    Education provided Yes    Person(s) Educated Patient    Methods Explanation;Demonstration    Comprehension Verbalized understanding;Returned demonstration;Verbal cues required            OT Short Term Goals - 03/03/17 1459      OT SHORT TERM GOAL #1   Title `             OT Long Term Goals - 04/05/20 1400      OT LONG TERM GOAL #1   Title Pt. will UE functioning to be able to perfrom toilet hygiene with  minimal assistance.    Baseline 01/14/2018 - Pt completes toileting hygiene independently. Pt requires assistance managing LB clothing.    Time 12    Period Weeks    Status Achieved      OT LONG TERM GOAL #2   Title Pt. will complete self grooming with minA    Baseline 02/24/2018 - Pt is independent with oral care and washing his face and occasionally requires increased time if he is having a bad day. Pt's shaving is completed by his barber.    Time 12    Period Weeks    Status Achieved      OT LONG TERM GOAL #3   Title Pt. will perform self-feeding skills with modified indepndence    Baseline 01/14/2018 - Pt is able to complete self-feeding independently with minimal spillage. Pt is unable to cut meat at this time.    Time 12    Period Weeks    Status Achieved      OT LONG TERM GOAL #4   Title Pt. will perform self dressing with minA and A/E as needed.    Baseline Pt. requires [place] LE dressing. Pt. has difficulty with LE the right LE secondary to AFO.  Pt. continues to be able to donn a T shirt independently with increased time. MinA donning a jacket, Mod A donning pants over his feet, and socks, minA with shoes.    Time 12    Period Weeks    Status On-going    Target Date 06/27/20      OT LONG TERM GOAL #5   Title Pt. will perform light home making tasks with minA    Baseline Pt is able to wash dishes while seated in w/c with increased accessibility of new apartment. Pt. is able to fold his clothes, his moms puts them in the and out of the washer, and dryer, and puts them onto hangers. Caregivers perform vacuuming, and dusting.    Time 12    Period Weeks    Status Achieved      OT LONG TERM GOAL #6   Title Pt. will write his name efficiently with 100% legibility    Baseline Pt. continues to present with 755 legibility with printed form, and 50% legibility with cursive form. Pt. continues to present with 75% legibility with increased time required when printing his name, and 50% legibility when cursive writing his name to sign in when riding the [place]    Time 12    Period Weeks    Status On-going    Target Date 06/27/20      OT LONG TERM GOAL #7   Title Pt will independently and consistently follow HEP to increase UE strength to increase functional independence    Baseline Pt. continues to work towards independence with exercises    Time 12    Period Weeks    Status On-going    Target Date 06/27/20      OT LONG TERM GOAL #8   Title Pt. will require supervision ironing a shirt.    Baseline Pt. has progressed CGA, and complete set-up seated using a tabletop iron    Time 12    Period Weeks    Status Partially Met    Target Date 06/27/20      OT LONG TERM GOAL  #9   Baseline Pt. will independently be able to sew a button onto a shirt.    Time 12    Period Weeks    Status  On-going    Target Date 06/27/20      OT LONG TERM GOAL  #10   TITLE Pt. will independently be able to throw a ball    Baseline Pt. continues to be limited  with throwing overhand, however pt. is able to perform underhand throwing.    Time 12    Period Weeks    Status On-going    Target Date 06/27/20      OT LONG TERM GOAL  #11   TITLE Pt. will improve UE functional reaching to be able to independently use his RUE to hand his clothes in the closest.    Baseline Pt. uses a reacher to complete. Pt. continues to improve with reaching up with the RUE, however has difficulty reaching the hanger rack in the closet. Pt. now uses a reacher.    Time 12    Period Weeks    Status On-going    Target Date 06/27/20      OT LONG TERM GOAL  #12   TITLE Pt. will  be independent with shaving using an electric razer.    Baseline Pt. is able to use the electric razer independently, however is unable to access the position of the plug and requires assist form caregivers secondary to the size of the bathroom, and location of the plug.    Time 12    Period Weeks    Status Achieved      OT LONG TERM GOAL  #13   TITLE Pt. will increase BUE strength to be able to prepare for assisting with yardwork.    Baseline Pt. BUE strength conitnues to be 4+/5 Pt. reports that he has been able to get out to the front.    Time 12    Period Weeks    Status On-going      OT LONG TERM GOAL  #14   TITLE Pt. will improve activity toelrance in standing to be able to complete standing ADLs, and IADLs with SBA.    Baseline Pt. is limited with ADL performance in standing.    Time 12    Period Weeks    Status On-going    Target Date 06/27/20                 Plan - 04/10/20 1443    Clinical Impression Statement Pt. reports having had a nice New Years Holiday. Pt. required increased time to work on the task. Pt. was able to attend to the task, and maintain a mature grasp on the pen for the entire duration of the task. Pt. Presented with moderate deviation from the lines on the design. Pt. Continues to work on Armed forces training and education officer, and [facility] skills in order to improve, and  maximize independence with ADLs, and IADLs.   OT Occupational Profile and History Problem Focused Assessment - Including review of records relating to presenting problem    Occupational performance deficits (Please refer to evaluation for details): ADL's;IADL's    Body Structure / Function / Physical Skills ADL;FMC;ROM;IADL;Pain;Coordination    Rehab Potential Fair    Clinical Decision Making Several treatment options, min-mod task modification necessary    Comorbidities Affecting Occupational Performance: May have comorbidities impacting occupational performance    Modification or Assistance to Complete Evaluation  Min-Moderate modification of tasks or assist with assess necessary to complete eval    OT Frequency 1x / week    OT Duration 12 weeks    OT Treatment/Interventions Self-care/ADL training;Patient/family education;DME and/or AE instruction;Therapeutic exercise;Neuromuscular  education    Consulted and Agree with Plan of Care Patient           Patient will benefit from skilled therapeutic intervention in order to improve the following deficits and impairments:   Body Structure / Function / Physical Skills: ADL,FMC,ROM,IADL,Pain,Coordination       Visit Diagnosis: Muscle weakness (generalized)  Other lack of coordination    Problem List Patient Active Problem List   Diagnosis Date Noted  . Impaired glucose tolerance 03/23/2020  . Learning disability 03/23/2020  . Urinary and fecal incontinence 06/14/2019  . Chronic pain syndrome 03/23/2019  . Constipation 03/23/2019  . Dysarthria 03/23/2019  . Dysphagia 03/23/2019  . Learning difficulty 03/23/2019  . Morbid obesity ([place]) 03/23/2019  . Muscle weakness 03/23/2019  . Vitamin D deficiency 03/23/2019  . Bilateral carotid artery stenosis 12/15/2018  . Moderate aortic valve stenosis 12/15/2018  . BMI 45.0-49.9, adult ([place]) 11/10/2018  . Cerebrovascular accident ([place]) 05/02/2016  . Hemiparesis affecting right side as  late effect of cerebrovascular accident ([place]) 05/01/2016  . Brain tumor ([place]) 11/24/2014  . Gastro-esophageal reflux disease without esophagitis 11/24/2014  . Accident due to mechanical fall without injury 08/03/2014  . Essential hypertension 06/13/2014  . Mixed hyperlipidemia 06/13/2014  . Unspecified sequelae of cerebral infarction 06/13/2014  . 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/2014  . Type 2 diabetes mellitus without complication ([place]) 44/61/9012  . Neck mass 09/08/2013  . Swimmer's ear 09/08/2013  . Erectile dysfunction 01/03/2013  . Prediabetes 01/03/2013    Harrel Carina, MS, OTR/L 04/10/2020, 2:44 PM  [place] MAIN [facility] SERVICES [address] [place], [place], [address] Phone: [phone]   Fax:  [phone]  Name: JAKSEN FIORELLA MRN: [id number] Date of Birth: 1966-02-24

## 2020-04-10 NOTE — Therapy (Signed)
[place] MAIN [facility] SERVICES [address] [place], [place], [address] Phone: [phone]   Fax:  [phone]  Physical Therapy Treatment/RECERT/Physical Therapy Progress Note   Dates of reporting period  03/01/20   to   04/10/20  Patient Details  Name: Curtis Powell MRN: [id number] Date of Birth: 01-21-1966 No data recorded  Encounter Date: 04/10/2020   PT End of Session - 04/10/20 1657    Visit Number 260    Number of Visits 284    Date for PT Re-Evaluation 07/03/20    Authorization Type next session 1/10  PN 04/10/20    PT Start Time 1345    PT Stop Time 1429    PT Time Calculation (min) 44 min    Equipment Utilized During Treatment Gait belt    Activity Tolerance Patient tolerated treatment well;No increased pain    Behavior During Therapy WFL for tasks assessed/performed           Past Medical History:  Diagnosis Date  . GERD (gastroesophageal reflux disease)   . Hyperlipidemia   . Hypertension   . Obesity   . Stroke [facility])     Past Surgical History:  Procedure Laterality Date  . BRAIN SURGERY      There were no vitals filed for this visit.   Subjective Assessment - 04/10/20 1656    Subjective Patient reports compliance with HEP. Reports no falls or LOB since last session. Is eager to get his night splint    Pertinent History  Patient is a pleasant 55 year old male who presents to physical therapy for weakness and immobility secondary to CVA.  Had a stroke in Feb 2018. He was previously fully independent, but this stroke caused severe residual deficits, mainly on the right side as well as speech, and now he is unable to walk or perform most of his ADLs on his own. Entire right side is very weak. He still has a little difficulty with speech but his swallowing is improved to baseline. His mom had to move in with him and now is his main caregiver.     Limitations Reading    How long can you sit comfortably? 5 minutes    How long  can you stand comfortably? 5 minutes    How long can you walk comfortably? 8 minutes    Patient Stated Goals Walk without walker, walk further with walker, get some strength in back     Currently in Pain? No/denies              [facility] PT Assessment - 04/10/20 0001      Standardized Balance Assessment   Standardized Balance Assessment Berg Balance Test      Berg Balance Test   Sit to Stand Able to stand  independently using hands    Standing Unsupported Able to stand safely 2 minutes    Sitting with Back Unsupported but Feet Supported on Floor or Stool Able to sit safely and securely 2 minutes    Stand to Sit Controls descent by using hands    Transfers Able to transfer safely, definite need of hands    Standing Unsupported with Eyes Closed Able to stand 10 seconds with supervision    Standing Unsupported with Feet Together Able to place feet together independently but unable to hold for 30 seconds    From Standing, Reach Forward with Outstretched Arm Can reach forward >12 cm safely (5")    From Standing Position, Pick up  Object from Floor Able to pick up shoe, needs supervision    From Standing Position, Turn to Look Behind Over each Shoulder Looks behind one side only/other side shows less weight shift    Turn 360 Degrees Needs assistance while turning    Standing Unsupported, Alternately Place Feet on Step/Stool Able to complete >2 steps/needs minimal assist    Standing Unsupported, One Foot in Front Needs help to step but can hold 15 seconds    Standing on One Leg Unable to try or needs assist to prevent fall    Total Score 33                  Goals: Bed mobility: patient reports mod I now. Can roll himself now and transition to sitting without assistance. : MET  5x STS: 18 seconds Bathroom mobility: able to complete toileting in standing but needs to sit to wash hands.  BLE strength Ambulate 200 ft: ambulates 200 ft with RW and wheelchair follow, no stops, no rest  breaks.  10 MWT: 42 seconds with AFO BERG: 33/56   Patient's condition has the potential to improve in response to therapy. Maximum improvement is yet to be obtained. The anticipated improvement is attainable and reasonable in a generally predictable time.  Patient reports he is moving better but not yet where he wants to be. Wants to be more steady in standing and walking and be able to walk more.                 PT Education - 04/10/20 1349    Education provided Yes    Education Details POC, goals    Person(s) Educated Patient    Methods Explanation;Demonstration;Tactile cues;Verbal cues    Comprehension Verbalized understanding;Returned demonstration;Verbal cues required;Tactile cues required            PT Short Term Goals - 01/24/20 1711      PT SHORT TERM GOAL #1   Title Patient will be independent in home exercise program to improve strength/mobility for better functional independence with ADLs.    Baseline hep compliant    Time 2    Period Weeks    Status Achieved      PT SHORT TERM GOAL #2   Title Patient will require min cueing for STS transfer with CGA for increased independence with mobility.     Baseline CGA    Time 2    Period Weeks    Status Achieved      PT SHORT TERM GOAL #3   Title Patient will maintain upright posture for > 15 seconds to demonstrate strengthened postural control muscualture     Baseline 18 seconds     Time 2    Period Weeks    Status Achieved             PT Long Term Goals - 04/10/20 0001      PT LONG TERM GOAL #1   Title Patient will perform bed mobility (rolling and supine <>sit) with Mod I  to increase mobility and  perform ADLs.     Baseline 9/13: able to perform with mod I occasional Min A when very fatigued 7/14: Min A 4/21: unable to perform 6/15: improving but still min-mod A  11/24: min-modA, difficulty breathing with changing positions 1/3: able to perform without assistance    Time 12    Period Weeks     Status Achieved      PT LONG TERM GOAL #2  Title Patient (< 46 years old) will complete five times sit to stand test in < 10 seconds indicating an increased LE strength and improved balance.    Baseline 9/13: 14 seconds with one near LOB 7/14: 14.31 seconds 6/21: 16.86 seconds 4/21: 18.8 seconds 06/02/19: 19.93 seconds one hand on RW, one hand on w/c 12/30: 16 secpmds BUE support 02/11/19: 36 seconds 5/19: 20.6s from [facility], one hand on walker and one on WC, 80% upright posture 7/1: 22 seconds one hand on walker one hand on w/c  9/14: 48 seconds with full upright posture 11/24: 25 seconds with full upright posture, one hand on 2WW 1/3: 18 seocnds with one near LOB    Time 12    Period Weeks    Status Partially Met    Target Date 07/03/20      PT LONG TERM GOAL #3   Title Patient will negotiate a bathroom with a walker and Min A for increased indep in home and external environment    Baseline 9/13: able to some in standing, requires sitting down to wash hands 7/14: unable to do  11/24: able to complete voiding in standing and requires sitting down to wash hands 1/3: able to complete toileting in standing but needs to sit to wash hands.    Time 12    Period Weeks    Status Partially Met    Target Date 07/03/20      PT LONG TERM GOAL #4   Title Patient will increase BLE gross strength to 4+/5 as to improve functional strength for independent gait, increased standing tolerance and increased ADL ability.    Baseline  4/21: see note 06/02/19: hip flexion 4-/5, extension/abd 3/5 12/30: hip flexion 4/5 hip extension and abduction 3/5 02/11/19: hip flexion 4/5 hip extension and abduction 2+/54-/5 RLE, 10/15: 4/5 RLE 11/7: 4/5 RLE 3/3: 4/5 7/1:  hip flexion 4/5 hip extension 2/5 hip abduction/adduciton 2/5     Time 12    Period Weeks    Status Partially Met    Target Date 07/03/20      PT LONG TERM GOAL #5   Title Patient will ambulate 250 ft with least assistive device and Supervision with no breaks to  allow for increased mobility within home.    Baseline 1/3: 200 ft no breaks    Time 12    Period Weeks    Status New    Target Date 07/03/20      PT LONG TERM GOAL #7   Title Patient will perform 10 MWT in <30 seconds for improved gait speed, gait mechanics, and functional capacity for mobility.     Baseline 10/11: 48s with BRW and WC follow; 9/13: terminated due to wardrobe malfunction 9/8: 42 seconds with R foot collapse 7/14: 37.98 seconds 6/21: 46.9 seconds with RW 07/28/19: 51 seconds with RW 06/02/19: 62.5 seconds with RW 11/4: 56 seconds with AFO and RW 12/30: 53 seconds with AFO and RW  04/10/20: 42 seconds with AFO and RW    Time 12    Period Weeks    Status Partially Met    Target Date 07/03/20      PT LONG TERM GOAL #8   Title Patient will increase Berg Balance score by > 6 points (38/56) to demonstrate decreased fall risk during functional activities.    Baseline 9/13: 31/56 7/14: 33/56 07/28/19: 32/56 6/21: 32/56 04/10/20: 33/56    Time 12    Period Weeks    Status Partially Met  Target Date 07/03/20                 Plan - 04/10/20 1700    Clinical Impression Statement Patient demonstrates excellent progress towards functional goals. He has met his bed mobility goal and improved his gait speed. His BERG improved slightly and he was able to ambulate 200 ft without a single standing or seated rest break. Although progress is slow it is continuously progressive with patient demonstrating excellent motivation indicating strong positive potential. atient's condition has the potential to improve in response to therapy. Maximum improvement is yet to be obtained. The anticipated improvement is attainable and reasonable in a generally predictable time. The patient continues to benefit from additional skilled PT intervention to improve right LE strength and improve overall endurance for improved walking tolerance and to decrease fall risk    Rehab Potential Poor    Clinical  Impairments Affecting Rehab Potential hx of HTN, HLD, CVA, learning disability, Diabetes, brain tumor,     PT Frequency 2x / week    PT Duration 12 weeks    PT Treatment/Interventions ADLs/Self Care Home Management;Aquatic Therapy;Ultrasound;Moist Heat;Traction;DME Instruction;Gait training;Stair training;Functional mobility training;Therapeutic activities;Therapeutic exercise;Orthotic Fit/Training;Neuromuscular re-education;Balance training;Patient/family education;Manual techniques;Wheelchair mobility training;Passive range of motion;Energy conservation;Taping;Visual/perceptual remediation/compensation    PT Next Visit Plan progress standing balance    PT Home Exercise Plan TrA contraction, glute sets: needs updates. Potentially some time in supine flat in hospital bed, to improve hip extension deficits.     Consulted and Agree with Plan of Care Patient    Family Member Consulted mother           Patient will benefit from skilled therapeutic intervention in order to improve the following deficits and impairments:  Abnormal gait,Decreased activity tolerance,Decreased balance,Decreased knowledge of precautions,Decreased endurance,Decreased coordination,Decreased knowledge of use of DME,Decreased mobility,Decreased range of motion,Difficulty walking,Decreased safety awareness,Decreased strength,Impaired flexibility,Impaired perceived functional ability,Impaired tone,Postural dysfunction,Improper body mechanics,Pain  Visit Diagnosis: Muscle weakness (generalized)  Other lack of coordination  Other abnormalities of gait and mobility     Problem List Patient Active Problem List   Diagnosis Date Noted  . Impaired glucose tolerance 03/23/2020  . Learning disability 03/23/2020  . Urinary and fecal incontinence 06/14/2019  . Chronic pain syndrome 03/23/2019  . Constipation 03/23/2019  . Dysarthria 03/23/2019  . Dysphagia 03/23/2019  . Learning difficulty 03/23/2019  . Morbid obesity ([place])  03/23/2019  . Muscle weakness 03/23/2019  . Vitamin D deficiency 03/23/2019  . Bilateral carotid artery stenosis 12/15/2018  . Moderate aortic valve stenosis 12/15/2018  . BMI 45.0-49.9, adult ([place]) 11/10/2018  . Cerebrovascular accident ([place]) 05/02/2016  . Hemiparesis affecting right side as late effect of cerebrovascular accident ([place]) 05/01/2016  . Brain tumor ([place]) 11/24/2014  . Gastro-esophageal reflux disease without esophagitis 11/24/2014  . Accident due to mechanical fall without injury 08/03/2014  . Essential hypertension 06/13/2014  . Mixed hyperlipidemia 06/13/2014  . Unspecified sequelae of cerebral infarction 06/13/2014  . 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/2014  . Type 2 diabetes mellitus without complication ([place]) 05/69/7948  . Neck mass 09/08/2013  . Swimmer's ear 09/08/2013  . Erectile dysfunction 01/03/2013  . Prediabetes 01/03/2013   Janna Arch, PT, DPT   04/10/2020, 5:05 PM  [place] MAIN [facility] SERVICES [address] [place], [place], [address] Phone: [phone]   Fax:  [phone]  Name: Curtis Powell MRN: [id number] Date of Birth: 1965/06/03

## 2020-04-12 ENCOUNTER — Ambulatory Visit: Payer: Medicare HMO | Admitting: Occupational Therapy

## 2020-04-12 ENCOUNTER — Ambulatory Visit: Payer: Medicare HMO

## 2020-04-12 ENCOUNTER — Other Ambulatory Visit: Payer: Self-pay

## 2020-04-12 DIAGNOSIS — R278 Other lack of coordination: Secondary | ICD-10-CM

## 2020-04-12 DIAGNOSIS — M6281 Muscle weakness (generalized): Secondary | ICD-10-CM

## 2020-04-12 DIAGNOSIS — R2689 Other abnormalities of gait and mobility: Secondary | ICD-10-CM

## 2020-04-12 NOTE — Therapy (Signed)
[place] MAIN [facility] SERVICES [address] [place], [place], [address] Phone: [phone]   Fax:  [phone]  Physical Therapy Treatment  Patient Details  Name: Curtis Powell MRN: [id number] Date of Birth: Feb 15, 1966 No data recorded  Encounter Date: 04/12/2020   PT End of Session - 04/12/20 1321    Visit Number [id number]    Number of Visits 284    Date for PT Re-Evaluation 07/03/20    Authorization Type 1/10  PN 04/10/20    PT Start Time 1259    PT Stop Time 1344    PT Time Calculation (min) 45 min    Equipment Utilized During Treatment Gait belt    Activity Tolerance Patient tolerated treatment well;No increased pain    Behavior During Therapy WFL for tasks assessed/performed           Past Medical History:  Diagnosis Date  . GERD (gastroesophageal reflux disease)   . Hyperlipidemia   . Hypertension   . Obesity   . Stroke [facility])     Past Surgical History:  Procedure Laterality Date  . BRAIN SURGERY      There were no vitals filed for this visit.   Subjective Assessment - 04/12/20 1317    Subjective Patient reports no falls or LOB since last session. Compliant with HEP, first week of only 1x/week OT appointment.    Pertinent History  Patient is a pleasant 55 year old male who presents to physical therapy for weakness and immobility secondary to CVA.  Had a stroke in Feb 2018. He was previously fully independent, but this stroke caused severe residual deficits, mainly on the right side as well as speech, and now he is unable to walk or perform most of his ADLs on his own. Entire right side is very weak. He still has a little difficulty with speech but his swallowing is improved to baseline. His mom had to move in with him and now is his main caregiver.     Limitations Reading    How long can you sit comfortably? 5 minutes    How long can you stand comfortably? 5 minutes    How long can you walk comfortably? 8 minutes    Patient Stated  Goals Walk without walker, walk further with walker, get some strength in back     Currently in Pain? No/denies                  Gait: Patient ambulates 160 ft with RW, CGA, and Wheelchair follow for safety. Patient requires min verbal cues for proximity to walker throughout gait trial as well as upright posture and weight shift.  Improved gait mechanics and gait speed.      Standing with RW: (wheelchair behind)  5lb ankle weights: -marching with heavy UE support, 10x each LE, cues for increasing foot clearance bilaterally -hip extension 12x each LE ; min A to walker for stabilization.  -6" step toe taps 10x each LE, min A to walker for stabilization    Shadow box with close CGA, patient able to stabilize self and correct for LOB. X 2 minutes   Seated:  seated punching for coordination, core stabilization against pertubation's 3x 30 seconds with cross body jabs to moving targerts Seated soccer ball kicks for spatial awareness, sequencing of muscle activation x 10 each LE RTB df 10x, pf 10x    5lb ankle weight:  -LAQ 15x 3-5 second holds each LE for soccer ball kicks,  -  march with upright posture 15x each LE     Vitals monitored throughout session with rest breaks to return HR to therapeutic range.    Pt educated throughout session about proper posture and technique with exercises. Improved exercise technique, movement at target joints, use of target muscles after min to mod verbal, visual, tactile cues.                    PT Education - 04/12/20 1320    Education provided Yes    Education Details exercise technique, body mechanics    Person(s) Educated Patient    Methods Explanation;Demonstration;Tactile cues;Verbal cues    Comprehension Verbalized understanding;Returned demonstration;Verbal cues required;Tactile cues required            PT Short Term Goals - 01/24/20 1711      PT SHORT TERM GOAL #1   Title Patient will be independent in home  exercise program to improve strength/mobility for better functional independence with ADLs.    Baseline hep compliant    Time 2    Period Weeks    Status Achieved      PT SHORT TERM GOAL #2   Title Patient will require min cueing for STS transfer with CGA for increased independence with mobility.     Baseline CGA    Time 2    Period Weeks    Status Achieved      PT SHORT TERM GOAL #3   Title Patient will maintain upright posture for > 15 seconds to demonstrate strengthened postural control muscualture     Baseline 18 seconds     Time 2    Period Weeks    Status Achieved             PT Long Term Goals - 04/10/20 0001      PT LONG TERM GOAL #1   Title Patient will perform bed mobility (rolling and supine <>sit) with Mod I  to increase mobility and  perform ADLs.     Baseline 9/13: able to perform with mod I occasional Min A when very fatigued 7/14: Min A 4/21: unable to perform 6/15: improving but still min-mod A  11/24: min-modA, difficulty breathing with changing positions 1/3: able to perform without assistance    Time 12    Period Weeks    Status Achieved      PT LONG TERM GOAL #2   Title Patient (< 79 years old) will complete five times sit to stand test in < 10 seconds indicating an increased LE strength and improved balance.    Baseline 9/13: 14 seconds with one near LOB 7/14: 14.31 seconds 6/21: 16.86 seconds 4/21: 18.8 seconds 06/02/19: 19.93 seconds one hand on RW, one hand on w/c 12/30: 16 secpmds BUE support 02/11/19: 36 seconds 5/19: 20.6s from [facility], one hand on walker and one on WC, 80% upright posture 7/1: 22 seconds one hand on walker one hand on w/c  9/14: 48 seconds with full upright posture 11/24: 25 seconds with full upright posture, one hand on 2WW 1/3: 18 seocnds with one near LOB    Time 12    Period Weeks    Status Partially Met    Target Date 07/03/20      PT LONG TERM GOAL #3   Title Patient will negotiate a bathroom with a walker and Min A for increased  indep in home and external environment    Baseline 9/13: able to some in standing, requires sitting down to  wash hands 7/14: unable to do  11/24: able to complete voiding in standing and requires sitting down to wash hands 1/3: able to complete toileting in standing but needs to sit to wash hands.    Time 12    Period Weeks    Status Partially Met    Target Date 07/03/20      PT LONG TERM GOAL #4   Title Patient will increase BLE gross strength to 4+/5 as to improve functional strength for independent gait, increased standing tolerance and increased ADL ability.    Baseline  4/21: see note 06/02/19: hip flexion 4-/5, extension/abd 3/5 12/30: hip flexion 4/5 hip extension and abduction 3/5 02/11/19: hip flexion 4/5 hip extension and abduction 2+/54-/5 RLE, 10/15: 4/5 RLE 11/7: 4/5 RLE 3/3: 4/5 7/1:  hip flexion 4/5 hip extension 2/5 hip abduction/adduciton 2/5     Time 12    Period Weeks    Status Partially Met    Target Date 07/03/20      PT LONG TERM GOAL #5   Title Patient will ambulate 250 ft with least assistive device and Supervision with no breaks to allow for increased mobility within home.    Baseline 1/3: 200 ft no breaks    Time 12    Period Weeks    Status New    Target Date 07/03/20      PT LONG TERM GOAL #7   Title Patient will perform 10 MWT in <30 seconds for improved gait speed, gait mechanics, and functional capacity for mobility.     Baseline 10/11: 48s with BRW and WC follow; 9/13: terminated due to wardrobe malfunction 9/8: 42 seconds with R foot collapse 7/14: 37.98 seconds 6/21: 46.9 seconds with RW 07/28/19: 51 seconds with RW 06/02/19: 62.5 seconds with RW 11/4: 56 seconds with AFO and RW 12/30: 53 seconds with AFO and RW  04/10/20: 42 seconds with AFO and RW    Time 12    Period Weeks    Status Partially Met    Target Date 07/03/20      PT LONG TERM GOAL #8   Title Patient will increase Berg Balance score by > 6 points (38/56) to demonstrate decreased fall risk  during functional activities.    Baseline 9/13: 31/56 7/14: 33/56 07/28/19: 32/56 6/21: 32/56 04/10/20: 33/56    Time 12    Period Weeks    Status Partially Met    Target Date 07/03/20                 Plan - 04/12/20 1346    Clinical Impression Statement Patient remains highly motivated throughout physical therapy session. Standing interventions limited by fatigue by end of session however patient demonstrates decreased episodes of LOB.  Stabilization to RW continues to be required for posterior chain activation in standing. The patient continues to benefit from additional skilled PT intervention to improve right LE strength and improve overall endurance for improved walking tolerance and to decrease fall risk    Rehab Potential Poor    Clinical Impairments Affecting Rehab Potential hx of HTN, HLD, CVA, learning disability, Diabetes, brain tumor,     PT Frequency 2x / week    PT Duration 12 weeks    PT Treatment/Interventions ADLs/Self Care Home Management;Aquatic Therapy;Ultrasound;Moist Heat;Traction;DME Instruction;Gait training;Stair training;Functional mobility training;Therapeutic activities;Therapeutic exercise;Orthotic Fit/Training;Neuromuscular re-education;Balance training;Patient/family education;Manual techniques;Wheelchair mobility training;Passive range of motion;Energy conservation;Taping;Visual/perceptual remediation/compensation    PT Next Visit Plan progress standing balance    PT Home Exercise Plan TrA  contraction, glute sets: needs updates. Potentially some time in supine flat in hospital bed, to improve hip extension deficits.     Consulted and Agree with Plan of Care Patient    Family Member Consulted mother           Patient will benefit from skilled therapeutic intervention in order to improve the following deficits and impairments:  Abnormal gait,Decreased activity tolerance,Decreased balance,Decreased knowledge of precautions,Decreased endurance,Decreased  coordination,Decreased knowledge of use of DME,Decreased mobility,Decreased range of motion,Difficulty walking,Decreased safety awareness,Decreased strength,Impaired flexibility,Impaired perceived functional ability,Impaired tone,Postural dysfunction,Improper body mechanics,Pain  Visit Diagnosis: Muscle weakness (generalized)  Other lack of coordination  Other abnormalities of gait and mobility     Problem List Patient Active Problem List   Diagnosis Date Noted  . Impaired glucose tolerance 03/23/2020  . Learning disability 03/23/2020  . Urinary and fecal incontinence 06/14/2019  . Chronic pain syndrome 03/23/2019  . Constipation 03/23/2019  . Dysarthria 03/23/2019  . Dysphagia 03/23/2019  . Learning difficulty 03/23/2019  . Morbid obesity ([place]) 03/23/2019  . Muscle weakness 03/23/2019  . Vitamin D deficiency 03/23/2019  . Bilateral carotid artery stenosis 12/15/2018  . Moderate aortic valve stenosis 12/15/2018  . BMI 45.0-49.9, adult ([place]) 11/10/2018  . Cerebrovascular accident ([place]) 05/02/2016  . Hemiparesis affecting right side as late effect of cerebrovascular accident ([place]) 05/01/2016  . Brain tumor ([place]) 11/24/2014  . Gastro-esophageal reflux disease without esophagitis 11/24/2014  . Accident due to mechanical fall without injury 08/03/2014  . Essential hypertension 06/13/2014  . Mixed hyperlipidemia 06/13/2014  . Unspecified sequelae of cerebral infarction 06/13/2014  . 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/2014  . Type 2 diabetes mellitus without complication ([place]) 97/41/6384  . Neck mass 09/08/2013  . Swimmer's ear 09/08/2013  . Erectile dysfunction 01/03/2013  . Prediabetes 01/03/2013   Janna Arch, PT, DPT   04/12/2020, 1:48 PM  [place] MAIN [facility] SERVICES [address] [place], [place], [address] Phone: [phone]   Fax:  [phone]  Name: Curtis Powell MRN: [id number] Date of Birth: 11-23-1965

## 2020-04-17 ENCOUNTER — Ambulatory Visit: Payer: Medicare HMO | Admitting: Physical Therapy

## 2020-04-17 ENCOUNTER — Other Ambulatory Visit: Payer: Self-pay

## 2020-04-17 ENCOUNTER — Ambulatory Visit: Payer: Medicare HMO | Admitting: Occupational Therapy

## 2020-04-17 ENCOUNTER — Encounter: Payer: Self-pay | Admitting: Occupational Therapy

## 2020-04-17 DIAGNOSIS — M6281 Muscle weakness (generalized): Secondary | ICD-10-CM

## 2020-04-17 DIAGNOSIS — R2689 Other abnormalities of gait and mobility: Secondary | ICD-10-CM

## 2020-04-17 DIAGNOSIS — R278 Other lack of coordination: Secondary | ICD-10-CM

## 2020-04-17 DIAGNOSIS — R531 Weakness: Secondary | ICD-10-CM

## 2020-04-17 NOTE — Therapy (Signed)
[place] MAIN [facility] SERVICES [address] [place], [place], [address] Phone: [phone]   Fax:  [phone]  Occupational Therapy Treatment  Patient Details  Name: Curtis Powell MRN: [id number] Date of Birth: 1966/02/27 No data recorded  Encounter Date: 04/17/2020   OT End of Session - 04/17/20 1319    Visit Number [id number]    Number of Visits 258    Date for OT Re-Evaluation 06/28/20    Authorization Type Progress reporting period starting 03/27/2020    OT Start Time 1300    OT Stop Time 1345    OT Time Calculation (min) 45 min    Activity Tolerance Patient tolerated treatment well    Behavior During Therapy [facility] for tasks assessed/performed           Past Medical History:  Diagnosis Date  . GERD (gastroesophageal reflux disease)   . Hyperlipidemia   . Hypertension   . Obesity   . Stroke [facility])     Past Surgical History:  Procedure Laterality Date  . BRAIN SURGERY      There were no vitals filed for this visit.   Subjective Assessment - 04/17/20 1319    Subjective  Pt. reports that he is doing well today.    Patient is accompanied by: Family member    Pertinent History Pt. is a 55 y.o. male who suffered a CVA on 05/01/2016. Pt. was admitted to the hospital. Once discharged, he received Home Health PT and OT services for about a month. Pt. has had multiple CVAs over the past 8 years, and has had multiple falls in the past 6 months. Pt. resides in an apartment. Pt. has caregivers for 80 hours. Pt.'s mother stays with pt. at night, and assists with IADL tasks.    Currently in Pain? No/denies          OT TREATMENT   Therapeutic Exercise:  Pt. worked on Occupational psychologist with his right hand coloring in a holiday design. Pt. was able to trace the designs, and and accurately fill in both thin, and wide spaces with moderate deviation over the design pattern.  Pt.reports having had a nice New Years Holiday.Pt. required  increased time to work on the task. Pt. continues to attend to the full task, and maintain a mature grasp on the pen for the entire duration of the task. Pt. presented with less deviation from the lines on the design. Pt. continues to work on Armed forces training and education officer, and [facility] skills in order to improve, and maximize independence with ADLs, and IADLs.                        OT Education - 04/17/20 1319    Education provided Yes    Education Details grip exercises, strengthening    Person(s) Educated Patient    Methods Explanation;Demonstration    Comprehension Verbalized understanding;Returned demonstration;Verbal cues required    Person(s) Educated Patient    Comprehension Verbalized understanding;Returned demonstration;Verbal cues required;Tactile cues required            OT Short Term Goals - 03/03/17 1459      OT SHORT TERM GOAL #1   Title `             OT Long Term Goals - 04/05/20 1400      OT LONG TERM GOAL #1   Title Pt. will UE functioning to be able to perfrom toilet hygiene with minimal assistance.  Baseline 01/14/2018 - Pt completes toileting hygiene independently. Pt requires assistance managing LB clothing.    Time 12    Period Weeks    Status Achieved      OT LONG TERM GOAL #2   Title Pt. will complete self grooming with minA    Baseline 02/24/2018 - Pt is independent with oral care and washing his face and occasionally requires increased time if he is having a bad day. Pt's shaving is completed by his barber.    Time 12    Period Weeks    Status Achieved      OT LONG TERM GOAL #3   Title Pt. will perform self-feeding skills with modified indepndence    Baseline 01/14/2018 - Pt is able to complete self-feeding independently with minimal spillage. Pt is unable to cut meat at this time.    Time 12    Period Weeks    Status Achieved      OT LONG TERM GOAL #4   Title Pt. will perform self dressing with minA and A/E as needed.     Baseline Pt. requires [place] LE dressing. Pt. has difficulty with LE the right LE secondary to AFO. Pt. continues to be able to donn a T shirt independently with increased time. MinA donning a jacket, Mod A donning pants over his feet, and socks, minA with shoes.    Time 12    Period Weeks    Status On-going    Target Date 06/27/20      OT LONG TERM GOAL #5   Title Pt. will perform light home making tasks with minA    Baseline Pt is able to wash dishes while seated in w/c with increased accessibility of new apartment. Pt. is able to fold his clothes, his moms puts them in the and out of the washer, and dryer, and puts them onto hangers. Caregivers perform vacuuming, and dusting.    Time 12    Period Weeks    Status Achieved      OT LONG TERM GOAL #6   Title Pt. will write his name efficiently with 100% legibility    Baseline Pt. continues to present with 755 legibility with printed form, and 50% legibility with cursive form. Pt. continues to present with 75% legibility with increased time required when printing his name, and 50% legibility when cursive writing his name to sign in when riding the [place]    Time 12    Period Weeks    Status On-going    Target Date 06/27/20      OT LONG TERM GOAL #7   Title Pt will independently and consistently follow HEP to increase UE strength to increase functional independence    Baseline Pt. continues to work towards independence with exercises    Time 12    Period Weeks    Status On-going    Target Date 06/27/20      OT LONG TERM GOAL #8   Title Pt. will require supervision ironing a shirt.    Baseline Pt. has progressed CGA, and complete set-up seated using a tabletop iron    Time 12    Period Weeks    Status Partially Met    Target Date 06/27/20      OT LONG TERM GOAL  #9   Baseline Pt. will independently be able to sew a button onto a shirt.    Time 12    Period Weeks    Status On-going    Target  Date 06/27/20      OT LONG TERM GOAL  #10    TITLE Pt. will independently be able to throw a ball    Baseline Pt. continues to be limited with throwing overhand, however pt. is able to perform underhand throwing.    Time 12    Period Weeks    Status On-going    Target Date 06/27/20      OT LONG TERM GOAL  #11   TITLE Pt. will improve UE functional reaching to be able to independently use his RUE to hand his clothes in the closest.    Baseline Pt. uses a reacher to complete. Pt. continues to improve with reaching up with the RUE, however has difficulty reaching the hanger rack in the closet. Pt. now uses a reacher.    Time 12    Period Weeks    Status On-going    Target Date 06/27/20      OT LONG TERM GOAL  #12   TITLE Pt. will  be independent with shaving using an electric razer.    Baseline Pt. is able to use the electric razer independently, however is unable to access the position of the plug and requires assist form caregivers secondary to the size of the bathroom, and location of the plug.    Time 12    Period Weeks    Status Achieved      OT LONG TERM GOAL  #13   TITLE Pt. will increase BUE strength to be able to prepare for assisting with yardwork.    Baseline Pt. BUE strength conitnues to be 4+/5 Pt. reports that he has been able to get out to the front.    Time 12    Period Weeks    Status On-going      OT LONG TERM GOAL  #14   TITLE Pt. will improve activity toelrance in standing to be able to complete standing ADLs, and IADLs with SBA.    Baseline Pt. is limited with ADL performance in standing.    Time 12    Period Weeks    Status On-going    Target Date 06/27/20                 Plan - 04/17/20 1321    Clinical Impression Statement Pt.reports having had a nice New Years Holiday.Pt. required increased time to work on the task. Pt. continues to attend to the full task, and maintain a mature grasp on the pen for the entire duration of the task. Pt. presented with less deviation from the lines on the  design. Pt. continues to work on Armed forces training and education officer, and [facility] skills in order to improve, and maximize independence with ADLs, and IADLs.   OT Occupational Profile and History Problem Focused Assessment - Including review of records relating to presenting problem    Occupational performance deficits (Please refer to evaluation for details): ADL's;IADL's    Body Structure / Function / Physical Skills ADL;FMC;ROM;IADL;Pain;Coordination    Rehab Potential Fair    Clinical Decision Making Several treatment options, min-mod task modification necessary    Comorbidities Affecting Occupational Performance: May have comorbidities impacting occupational performance    Modification or Assistance to Complete Evaluation  Min-Moderate modification of tasks or assist with assess necessary to complete eval    OT Frequency 1x / week    OT Duration 12 weeks    OT Treatment/Interventions Self-care/ADL training;Patient/family education;DME and/or AE instruction;Therapeutic exercise;Neuromuscular education    Consulted and Agree  with Plan of Care Patient           Patient will benefit from skilled therapeutic intervention in order to improve the following deficits and impairments:   Body Structure / Function / Physical Skills: ADL,FMC,ROM,IADL,Pain,Coordination       Visit Diagnosis: Muscle weakness (generalized)  Other lack of coordination    Problem List Patient Active Problem List   Diagnosis Date Noted  . Impaired glucose tolerance 03/23/2020  . Learning disability 03/23/2020  . Urinary and fecal incontinence 06/14/2019  . Chronic pain syndrome 03/23/2019  . Constipation 03/23/2019  . Dysarthria 03/23/2019  . Dysphagia 03/23/2019  . Learning difficulty 03/23/2019  . Morbid obesity ([place]) 03/23/2019  . Muscle weakness 03/23/2019  . Vitamin D deficiency 03/23/2019  . Bilateral carotid artery stenosis 12/15/2018  . Moderate aortic valve stenosis 12/15/2018  . BMI 45.0-49.9, adult ([place])  11/10/2018  . Cerebrovascular accident ([place]) 05/02/2016  . Hemiparesis affecting right side as late effect of cerebrovascular accident ([place]) 05/01/2016  . Brain tumor ([place]) 11/24/2014  . Gastro-esophageal reflux disease without esophagitis 11/24/2014  . Accident due to mechanical fall without injury 08/03/2014  . Essential hypertension 06/13/2014  . Mixed hyperlipidemia 06/13/2014  . Unspecified sequelae of cerebral infarction 06/13/2014  . 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/2014  . Type 2 diabetes mellitus without complication ([place]) 62/37/6283  . Neck mass 09/08/2013  . Swimmer's ear 09/08/2013  . Erectile dysfunction 01/03/2013  . Prediabetes 01/03/2013    Harrel Carina, MS, OTR/L 04/17/2020, 1:25 PM  [place] MAIN [facility] SERVICES [address] [place], [place], [address] Phone: [phone]   Fax:  [phone]  Name: Curtis Powell MRN: [id number] Date of Birth: [birth year]

## 2020-04-17 NOTE — Therapy (Signed)
[place] MAIN [facility] SERVICES [address] [place], [place], [address] Phone: [phone]   Fax:  [phone]  Physical Therapy Treatment  Patient Details  Name: Curtis Powell MRN: [id number] Date of Birth: 1966-03-14 No data recorded  Encounter Date: 04/17/2020   PT End of Session - 04/17/20 1541    Visit Number [id number]    Number of Visits 284    Date for PT Re-Evaluation 07/03/20    Authorization Type 1/10  PN 04/10/20    PT Start Time 1345    PT Stop Time 1430    PT Time Calculation (min) 45 min    Equipment Utilized During Treatment Gait belt    Activity Tolerance Patient tolerated treatment well;No increased pain    Behavior During Therapy WFL for tasks assessed/performed           Past Medical History:  Diagnosis Date  . GERD (gastroesophageal reflux disease)   . Hyperlipidemia   . Hypertension   . Obesity   . Stroke [facility])     Past Surgical History:  Procedure Laterality Date  . BRAIN SURGERY      There were no vitals filed for this visit.   Subjective Assessment - 04/17/20 1540    Subjective Patient denies of any falls or injuries since last therapy session.    Pertinent History  Patient is a pleasant 55 year old male who presents to physical therapy for weakness and immobility secondary to CVA.  Had a stroke in Feb 2018. He was previously fully independent, but this stroke caused severe residual deficits, mainly on the right side as well as speech, and now he is unable to walk or perform most of his ADLs on his own. Entire right side is very weak. He still has a little difficulty with speech but his swallowing is improved to baseline. His mom had to move in with him and now is his main caregiver.     Limitations Reading    How long can you sit comfortably? 5 minutes    How long can you stand comfortably? 5 minutes    How long can you walk comfortably? 8 minutes    Patient Stated Goals Walk without walker, walk further with  walker, get some strength in back     Currently in Pain? No/denies            Gait: 65 feet on level surface with 2WW; WC follow behind      Standing with RW: (wheelchair behind)  5lb ankle weights: -marching with heavy UE support, 10x each LE, cues for increasing foot clearance bilaterally   Seated: Seated soccer ball kicks for spatial awareness, sequencing of muscle activation x 10 each LE RTB df 10x, pf 10x    5lb ankle weight:  -LAQ 15x 3-5 second holds each LE for soccer ball kicks,  -march with upright posture 15x each LE  Sit to stands x 5       PT Short Term Goals - 01/24/20 1711      PT SHORT TERM GOAL #1   Title Patient will be independent in home exercise program to improve strength/mobility for better functional independence with ADLs.    Baseline hep compliant    Time 2    Period Weeks    Status Achieved      PT SHORT TERM GOAL #2   Title Patient will require min cueing for STS transfer with CGA for increased independence with mobility.  Baseline CGA    Time 2    Period Weeks    Status Achieved      PT SHORT TERM GOAL #3   Title Patient will maintain upright posture for > 15 seconds to demonstrate strengthened postural control muscualture     Baseline 18 seconds     Time 2    Period Weeks    Status Achieved             PT Long Term Goals - 04/10/20 0001      PT LONG TERM GOAL #1   Title Patient will perform bed mobility (rolling and supine <>sit) with Mod I  to increase mobility and  perform ADLs.     Baseline 9/13: able to perform with mod I occasional Min A when very fatigued 7/14: Min A 4/21: unable to perform 6/15: improving but still min-mod A  11/24: min-modA, difficulty breathing with changing positions 1/3: able to perform without assistance    Time 12    Period Weeks    Status Achieved      PT LONG TERM GOAL #2   Title Patient (< 59 years old) will complete five times sit to stand test in < 10 seconds indicating an increased LE  strength and improved balance.    Baseline 9/13: 14 seconds with one near LOB 7/14: 14.31 seconds 6/21: 16.86 seconds 4/21: 18.8 seconds 06/02/19: 19.93 seconds one hand on RW, one hand on w/c 12/30: 16 secpmds BUE support 02/11/19: 36 seconds 5/19: 20.6s from [facility], one hand on walker and one on WC, 80% upright posture 7/1: 22 seconds one hand on walker one hand on w/c  9/14: 48 seconds with full upright posture 11/24: 25 seconds with full upright posture, one hand on 2WW 1/3: 18 seocnds with one near LOB    Time 12    Period Weeks    Status Partially Met    Target Date 07/03/20      PT LONG TERM GOAL #3   Title Patient will negotiate a bathroom with a walker and Min A for increased indep in home and external environment    Baseline 9/13: able to some in standing, requires sitting down to wash hands 7/14: unable to do  11/24: able to complete voiding in standing and requires sitting down to wash hands 1/3: able to complete toileting in standing but needs to sit to wash hands.    Time 12    Period Weeks    Status Partially Met    Target Date 07/03/20      PT LONG TERM GOAL #4   Title Patient will increase BLE gross strength to 4+/5 as to improve functional strength for independent gait, increased standing tolerance and increased ADL ability.    Baseline  4/21: see note 06/02/19: hip flexion 4-/5, extension/abd 3/5 12/30: hip flexion 4/5 hip extension and abduction 3/5 02/11/19: hip flexion 4/5 hip extension and abduction 2+/54-/5 RLE, 10/15: 4/5 RLE 11/7: 4/5 RLE 3/3: 4/5 7/1:  hip flexion 4/5 hip extension 2/5 hip abduction/adduciton 2/5     Time 12    Period Weeks    Status Partially Met    Target Date 07/03/20      PT LONG TERM GOAL #5   Title Patient will ambulate 250 ft with least assistive device and Supervision with no breaks to allow for increased mobility within home.    Baseline 1/3: 200 ft no breaks    Time 12    Period Weeks  Status New    Target Date 07/03/20      PT LONG TERM  GOAL #7   Title Patient will perform 10 MWT in <30 seconds for improved gait speed, gait mechanics, and functional capacity for mobility.     Baseline 10/11: 48s with BRW and WC follow; 9/13: terminated due to wardrobe malfunction 9/8: 42 seconds with R foot collapse 7/14: 37.98 seconds 6/21: 46.9 seconds with RW 07/28/19: 51 seconds with RW 06/02/19: 62.5 seconds with RW 11/4: 56 seconds with AFO and RW 12/30: 53 seconds with AFO and RW  04/10/20: 42 seconds with AFO and RW    Time 12    Period Weeks    Status Partially Met    Target Date 07/03/20      PT LONG TERM GOAL #8   Title Patient will increase Berg Balance score by > 6 points (38/56) to demonstrate decreased fall risk during functional activities.    Baseline 9/13: 31/56 7/14: 33/56 07/28/19: 32/56 6/21: 32/56 04/10/20: 33/56    Time 12    Period Weeks    Status Partially Met    Target Date 07/03/20                 Plan - 04/17/20 1542    Clinical Impression Statement Patient completed strengthening exercises with fair tolerance to activity. He demonstrated LOB requiring multiple attempts to complete sit to stand. Patient continously demonstrated backwards leaning with standing. He ambulated on level surface with 2WW requiring CGA/SBA for 65 feet with decreaesd hip extension during push off phase on BLE. Patient would benefit from continued PT services to increase BLE strength, mobility, and balance skills to improve patient's quality of life.    Rehab Potential Poor    Clinical Impairments Affecting Rehab Potential hx of HTN, HLD, CVA, learning disability, Diabetes, brain tumor,     PT Frequency 2x / week    PT Duration 12 weeks    PT Treatment/Interventions ADLs/Self Care Home Management;Aquatic Therapy;Ultrasound;Moist Heat;Traction;DME Instruction;Gait training;Stair training;Functional mobility training;Therapeutic activities;Therapeutic exercise;Orthotic Fit/Training;Neuromuscular re-education;Balance training;Patient/family  education;Manual techniques;Wheelchair mobility training;Passive range of motion;Energy conservation;Taping;Visual/perceptual remediation/compensation    PT Next Visit Plan progress standing balance    PT Home Exercise Plan TrA contraction, glute sets: needs updates. Potentially some time in supine flat in hospital bed, to improve hip extension deficits.     Consulted and Agree with Plan of Care Patient           Patient will benefit from skilled therapeutic intervention in order to improve the following deficits and impairments:  Abnormal gait,Decreased activity tolerance,Decreased balance,Decreased knowledge of precautions,Decreased endurance,Decreased coordination,Decreased knowledge of use of DME,Decreased mobility,Decreased range of motion,Difficulty walking,Decreased safety awareness,Decreased strength,Impaired flexibility,Impaired perceived functional ability,Impaired tone,Postural dysfunction,Improper body mechanics,Pain  Visit Diagnosis: Muscle weakness (generalized)  Other lack of coordination  Other abnormalities of gait and mobility  Left-sided weakness     Problem List Patient Active Problem List   Diagnosis Date Noted  . Impaired glucose tolerance 03/23/2020  . Learning disability 03/23/2020  . Urinary and fecal incontinence 06/14/2019  . Chronic pain syndrome 03/23/2019  . Constipation 03/23/2019  . Dysarthria 03/23/2019  . Dysphagia 03/23/2019  . Learning difficulty 03/23/2019  . Morbid obesity ([place]) 03/23/2019  . Muscle weakness 03/23/2019  . Vitamin D deficiency 03/23/2019  . Bilateral carotid artery stenosis 12/15/2018  . Moderate aortic valve stenosis 12/15/2018  . BMI 45.0-49.9, adult ([place]) 11/10/2018  . Cerebrovascular accident ([place]) 05/02/2016  . Hemiparesis affecting right side as late effect  of cerebrovascular accident ([place]) 05/01/2016  . Brain tumor ([place]) 11/24/2014  . Gastro-esophageal reflux disease without esophagitis 11/24/2014  . Accident due  to mechanical fall without injury 08/03/2014  . Essential hypertension 06/13/2014  . Mixed hyperlipidemia 06/13/2014  . Unspecified sequelae of cerebral infarction 06/13/2014  . 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/2014  . Type 2 diabetes mellitus without complication ([place]) 37/00/5259  . Neck mass 09/08/2013  . Swimmer's ear 09/08/2013  . Erectile dysfunction 01/03/2013  . Prediabetes 01/03/2013   Karl Luke PT, DPT Netta Corrigan 04/17/2020, 3:48 PM  [place] MAIN [facility] SERVICES [address] [place], [place], [address] Phone: [phone]   Fax:  [phone]  Name: Curtis Powell MRN: [id number] Date of Birth: Feb 13, 1966

## 2020-04-18 ENCOUNTER — Ambulatory Visit: Admission: RE | Admit: 2020-04-18 | Payer: Medicare HMO | Source: Ambulatory Visit

## 2020-04-19 ENCOUNTER — Other Ambulatory Visit: Payer: Self-pay

## 2020-04-19 ENCOUNTER — Encounter: Payer: Medicare HMO | Admitting: Occupational Therapy

## 2020-04-19 ENCOUNTER — Ambulatory Visit: Payer: Medicare HMO | Admitting: Physical Therapy

## 2020-04-19 DIAGNOSIS — M6281 Muscle weakness (generalized): Secondary | ICD-10-CM

## 2020-04-19 DIAGNOSIS — R2689 Other abnormalities of gait and mobility: Secondary | ICD-10-CM

## 2020-04-19 DIAGNOSIS — R531 Weakness: Secondary | ICD-10-CM

## 2020-04-19 DIAGNOSIS — R278 Other lack of coordination: Secondary | ICD-10-CM

## 2020-04-19 DIAGNOSIS — I69351 Hemiplegia and hemiparesis following cerebral infarction affecting right dominant side: Secondary | ICD-10-CM

## 2020-04-19 DIAGNOSIS — I639 Cerebral infarction, unspecified: Secondary | ICD-10-CM

## 2020-04-19 NOTE — Therapy (Signed)
[place] MAIN [facility] SERVICES [address] [place], [place], [address] Phone: [phone]   Fax:  [phone]  Physical Therapy Treatment  Patient Details  Name: Curtis Powell MRN: [id number] Date of Birth: 01/08/1966 No data recorded  Encounter Date: 04/19/2020   PT End of Session - 04/19/20 1321    Visit Number [id number]    Number of Visits 284    Date for PT Re-Evaluation 07/03/20    Authorization Type 1/10  PN 04/10/20    PT Start Time 1345    PT Stop Time 1430    PT Time Calculation (min) 45 min    Equipment Utilized During Treatment Gait belt    Activity Tolerance Patient tolerated treatment well;No increased pain    Behavior During Therapy WFL for tasks assessed/performed           Past Medical History:  Diagnosis Date  . GERD (gastroesophageal reflux disease)   . Hyperlipidemia   . Hypertension   . Obesity   . Stroke [facility])     Past Surgical History:  Procedure Laterality Date  . BRAIN SURGERY      There were no vitals filed for this visit.   Subjective Assessment - 04/19/20 1442    Subjective Patient denies of any falls or injuries since last therapy session. He states he does not have any pain or soreness since last therapy session.    Pertinent History  Patient is a pleasant 55 year old male who presents to physical therapy for weakness and immobility secondary to CVA.  Had a stroke in Feb 2018. He was previously fully independent, but this stroke caused severe residual deficits, mainly on the right side as well as speech, and now he is unable to walk or perform most of his ADLs on his own. Entire right side is very weak. He still has a little difficulty with speech but his swallowing is improved to baseline. His mom had to move in with him and now is his main caregiver.     Limitations Reading    How long can you sit comfortably? 5 minutes    How long can you stand comfortably? 5 minutes    How long can you walk  comfortably? 8 minutes    Patient Stated Goals Walk without walker, walk further with walker, get some strength in back     Currently in Pain? No/denies            Standing with 2WW: (wheelchair behind):  Standing marching with heavy UE support x 10 each LE Mini squats x 10 reps with support of 2WW Multidirectional reach in standing x support of 2WW  Seated Balloon taps reaching inside/outside BOS without LOB x 2 minutes  Bathroom mobility x patient able to void independently and transfer independently; sits down to wash hands and is independent with hand washing.   Gait: 69f x 2 requiring seated rest break due to muscle fatigue     Clinical Impression: Patient tolerated strengthening exercises with fair tolerance to activity. He demonstrates increased weight shifting when participating in anticipatory and reactive balance exercises. He continues to reach out with his LUE minimally due to weakness and discomfort with reaching. Patient completed standing multidirectional reach with the support of 2WW with minimal postural swaying. Patient will continue to benefit from skilled physical therapy to improve generalized strength, ROM, and capacity for functional activity.      PT Short Term Goals - 01/24/20 1711  PT SHORT TERM GOAL #1   Title Patient will be independent in home exercise program to improve strength/mobility for better functional independence with ADLs.    Baseline hep compliant    Time 2    Period Weeks    Status Achieved      PT SHORT TERM GOAL #2   Title Patient will require min cueing for STS transfer with CGA for increased independence with mobility.     Baseline CGA    Time 2    Period Weeks    Status Achieved      PT SHORT TERM GOAL #3   Title Patient will maintain upright posture for > 15 seconds to demonstrate strengthened postural control muscualture     Baseline 18 seconds     Time 2    Period Weeks    Status Achieved             PT Long  Term Goals - 04/10/20 0001      PT LONG TERM GOAL #1   Title Patient will perform bed mobility (rolling and supine <>sit) with Mod I  to increase mobility and  perform ADLs.     Baseline 9/13: able to perform with mod I occasional Min A when very fatigued 7/14: Min A 4/21: unable to perform 6/15: improving but still min-mod A  11/24: min-modA, difficulty breathing with changing positions 1/3: able to perform without assistance    Time 12    Period Weeks    Status Achieved      PT LONG TERM GOAL #2   Title Patient (< 50 years old) will complete five times sit to stand test in < 10 seconds indicating an increased LE strength and improved balance.    Baseline 9/13: 14 seconds with one near LOB 7/14: 14.31 seconds 6/21: 16.86 seconds 4/21: 18.8 seconds 06/02/19: 19.93 seconds one hand on RW, one hand on w/c 12/30: 16 secpmds BUE support 02/11/19: 36 seconds 5/19: 20.6s from [facility], one hand on walker and one on WC, 80% upright posture 7/1: 22 seconds one hand on walker one hand on w/c  9/14: 48 seconds with full upright posture 11/24: 25 seconds with full upright posture, one hand on 2WW 1/3: 18 seocnds with one near LOB    Time 12    Period Weeks    Status Partially Met    Target Date 07/03/20      PT LONG TERM GOAL #3   Title Patient will negotiate a bathroom with a walker and Min A for increased indep in home and external environment    Baseline 9/13: able to some in standing, requires sitting down to wash hands 7/14: unable to do  11/24: able to complete voiding in standing and requires sitting down to wash hands 1/3: able to complete toileting in standing but needs to sit to wash hands.    Time 12    Period Weeks    Status Partially Met    Target Date 07/03/20      PT LONG TERM GOAL #4   Title Patient will increase BLE gross strength to 4+/5 as to improve functional strength for independent gait, increased standing tolerance and increased ADL ability.    Baseline  4/21: see note 06/02/19: hip  flexion 4-/5, extension/abd 3/5 12/30: hip flexion 4/5 hip extension and abduction 3/5 02/11/19: hip flexion 4/5 hip extension and abduction 2+/54-/5 RLE, 10/15: 4/5 RLE 11/7: 4/5 RLE 3/3: 4/5 7/1:  hip flexion 4/5 hip extension 2/5 hip  abduction/adduciton 2/5     Time 12    Period Weeks    Status Partially Met    Target Date 07/03/20      PT LONG TERM GOAL #5   Title Patient will ambulate 250 ft with least assistive device and Supervision with no breaks to allow for increased mobility within home.    Baseline 1/3: 200 ft no breaks    Time 12    Period Weeks    Status New    Target Date 07/03/20      PT LONG TERM GOAL #7   Title Patient will perform 10 MWT in <30 seconds for improved gait speed, gait mechanics, and functional capacity for mobility.     Baseline 10/11: 48s with BRW and WC follow; 9/13: terminated due to wardrobe malfunction 9/8: 42 seconds with R foot collapse 7/14: 37.98 seconds 6/21: 46.9 seconds with RW 07/28/19: 51 seconds with RW 06/02/19: 62.5 seconds with RW 11/4: 56 seconds with AFO and RW 12/30: 53 seconds with AFO and RW  04/10/20: 42 seconds with AFO and RW    Time 12    Period Weeks    Status Partially Met    Target Date 07/03/20      PT LONG TERM GOAL #8   Title Patient will increase Berg Balance score by > 6 points (38/56) to demonstrate decreased fall risk during functional activities.    Baseline 9/13: 31/56 7/14: 33/56 07/28/19: 32/56 6/21: 32/56 04/10/20: 33/56    Time 12    Period Weeks    Status Partially Met    Target Date 07/03/20                 Plan - 04/19/20 1850    Clinical Impression Statement Patient tolerated strengthening exercises with fair tolerance to activity. He demonstrates increased weight shifting when participating in anticipatory and reactive balance exercises. He continues to reach out with his LUE minimally due to weakness and discomfort with reaching. Patient completed standing multidirectional reach with the support of 2WW  with minimal postural swaying. Patient will continue to benefit from skilled physical therapy to improve generalized strength, ROM, and capacity for functional activity.    Rehab Potential Poor    Clinical Impairments Affecting Rehab Potential hx of HTN, HLD, CVA, learning disability, Diabetes, brain tumor,     PT Frequency 2x / week    PT Duration 12 weeks    PT Treatment/Interventions ADLs/Self Care Home Management;Aquatic Therapy;Ultrasound;Moist Heat;Traction;DME Instruction;Gait training;Stair training;Functional mobility training;Therapeutic activities;Therapeutic exercise;Orthotic Fit/Training;Neuromuscular re-education;Balance training;Patient/family education;Manual techniques;Wheelchair mobility training;Passive range of motion;Energy conservation;Taping;Visual/perceptual remediation/compensation    PT Next Visit Plan progress standing balance    PT Home Exercise Plan TrA contraction, glute sets: needs updates. Potentially some time in supine flat in hospital bed, to improve hip extension deficits.     Consulted and Agree with Plan of Care Patient           Patient will benefit from skilled therapeutic intervention in order to improve the following deficits and impairments:  Abnormal gait,Decreased activity tolerance,Decreased balance,Decreased knowledge of precautions,Decreased endurance,Decreased coordination,Decreased knowledge of use of DME,Decreased mobility,Decreased range of motion,Difficulty walking,Decreased safety awareness,Decreased strength,Impaired flexibility,Impaired perceived functional ability,Impaired tone,Postural dysfunction,Improper body mechanics,Pain  Visit Diagnosis: Muscle weakness (generalized)  Other lack of coordination  Other abnormalities of gait and mobility  Left-sided weakness  Hemiplegia and hemiparesis following cerebral infarction affecting right dominant side (HCC)  Acute CVA (cerebrovascular accident) [facility])     Problem List Patient  Active Problem  List   Diagnosis Date Noted  . Impaired glucose tolerance 03/23/2020  . Learning disability 03/23/2020  . Urinary and fecal incontinence 06/14/2019  . Chronic pain syndrome 03/23/2019  . Constipation 03/23/2019  . Dysarthria 03/23/2019  . Dysphagia 03/23/2019  . Learning difficulty 03/23/2019  . Morbid obesity ([place]) 03/23/2019  . Muscle weakness 03/23/2019  . Vitamin D deficiency 03/23/2019  . Bilateral carotid artery stenosis 12/15/2018  . Moderate aortic valve stenosis 12/15/2018  . BMI 45.0-49.9, adult ([place]) 11/10/2018  . Cerebrovascular accident ([place]) 05/02/2016  . Hemiparesis affecting right side as late effect of cerebrovascular accident ([place]) 05/01/2016  . Brain tumor ([place]) 11/24/2014  . Gastro-esophageal reflux disease without esophagitis 11/24/2014  . Accident due to mechanical fall without injury 08/03/2014  . Essential hypertension 06/13/2014  . Mixed hyperlipidemia 06/13/2014  . Unspecified sequelae of cerebral infarction 06/13/2014  . 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/2014  . Type 2 diabetes mellitus without complication ([place]) 11/19/4816  . Neck mass 09/08/2013  . Swimmer's ear 09/08/2013  . Erectile dysfunction 01/03/2013  . Prediabetes 01/03/2013   Karl Luke PT, DPT Netta Corrigan 04/19/2020, 6:51 PM  [place] MAIN [facility] SERVICES [address] [place], [place], [address] Phone: [phone]   Fax:  [phone]  Name: Curtis Powell MRN: [id number] Date of Birth: 1965-09-19

## 2020-04-20 ENCOUNTER — Other Ambulatory Visit: Payer: Medicare HMO

## 2020-04-24 ENCOUNTER — Encounter: Payer: Medicare HMO | Admitting: Occupational Therapy

## 2020-04-24 ENCOUNTER — Ambulatory Visit: Payer: Medicare HMO

## 2020-04-25 ENCOUNTER — Ambulatory Visit: Payer: Medicare HMO

## 2020-04-25 ENCOUNTER — Ambulatory Visit: Payer: Self-pay | Admitting: Physician Assistant

## 2020-04-26 ENCOUNTER — Ambulatory Visit: Payer: Medicare HMO

## 2020-04-26 ENCOUNTER — Encounter: Payer: Self-pay | Admitting: Physician Assistant

## 2020-04-26 ENCOUNTER — Other Ambulatory Visit: Payer: Self-pay

## 2020-04-26 ENCOUNTER — Encounter: Payer: Medicare HMO | Admitting: Occupational Therapy

## 2020-04-26 DIAGNOSIS — M6281 Muscle weakness (generalized): Secondary | ICD-10-CM | POA: Diagnosis not present

## 2020-04-26 DIAGNOSIS — R278 Other lack of coordination: Secondary | ICD-10-CM

## 2020-04-26 DIAGNOSIS — R2689 Other abnormalities of gait and mobility: Secondary | ICD-10-CM

## 2020-04-26 NOTE — Therapy (Signed)
[place] MAIN [facility] SERVICES [address] [place], [place], [address] Phone: [phone]   Fax:  [phone]  Physical Therapy Treatment  Patient Details  Name: Curtis Powell MRN: [id number] Date of Birth: 11-22-1965 No data recorded  Encounter Date: 04/26/2020   PT End of Session - 04/26/20 1414    Visit Number [id number]    Number of Visits 284    Date for PT Re-Evaluation 07/03/20    Authorization Type 1/10  PN 04/10/20    PT Start Time 1300    PT Stop Time 1358    PT Time Calculation (min) 58 min    Equipment Utilized During Treatment Gait belt    Activity Tolerance Patient tolerated treatment well;No increased pain    Behavior During Therapy WFL for tasks assessed/performed           Past Medical History:  Diagnosis Date  . GERD (gastroesophageal reflux disease)   . Hyperlipidemia   . Hypertension   . Obesity   . Stroke [facility])     Past Surgical History:  Procedure Laterality Date  . BRAIN SURGERY      There were no vitals filed for this visit.   Subjective Assessment - 04/26/20 1255    Subjective Pt reports no pain today and denies any recent falls or near falls.    Pertinent History  Patient is a pleasant 55 year old male who presents to physical therapy for weakness and immobility secondary to CVA.  Had a stroke in Feb 2018. He was previously fully independent, but this stroke caused severe residual deficits, mainly on the right side as well as speech, and now he is unable to walk or perform most of his ADLs on his own. Entire right side is very weak. He still has a little difficulty with speech but his swallowing is improved to baseline. His mom had to move in with him and now is his main caregiver.     Limitations Reading    How long can you sit comfortably? 5 minutes    How long can you stand comfortably? 5 minutes    How long can you walk comfortably? 8 minutes    Patient Stated Goals Walk without walker, walk further with  walker, get some strength in back     Currently in Pain? No/denies          Therex:  Semi-squats- 2x10, 1x8 pt rates difficulty as "medium," requires B UE support on bar, CGA Alternating toe taps onto 4" step - 1x8, CGA and B UE support on bar. Pt rates exercise medium - pt reports easier lifting L LE.  Seated marches with 4# ankle weight 1x15, 2x20 pt reports exercise feels "easy" Standing at [place] for tolerance with throwing ball at target x multiple repetitions, CGA and B UE support on 2WW. Ambulation for LE endurance - 90.1 ft with 2WW, WC follow and CGA, followed by prolonged seated rest break. Patient requires min verbal cues for proximity to walker throughout as well as upright posture.       Assessment: Pt able to ambulate with 2WW, CGA and WC follow for 90.1 feet today before requiring a prolonged seated rest break. Pt with improved activity tolerance evidenced by ability to perform multiple reps of semi-squats, but pt has difficulty coming to a full upright position upon standing. Pt relies on B UE support for all standing therex. Pt will benefit from further skilled therapy to increase balance, and B LE strength,  endurance, and overall mobility in order to improve activity tolerance and gait.     PT Short Term Goals - 01/24/20 1711      PT SHORT TERM GOAL #1   Title Patient will be independent in home exercise program to improve strength/mobility for better functional independence with ADLs.    Baseline hep compliant    Time 2    Period Weeks    Status Achieved      PT SHORT TERM GOAL #2   Title Patient will require min cueing for STS transfer with CGA for increased independence with mobility.     Baseline CGA    Time 2    Period Weeks    Status Achieved      PT SHORT TERM GOAL #3   Title Patient will maintain upright posture for > 15 seconds to demonstrate strengthened postural control muscualture     Baseline 18 seconds     Time 2    Period Weeks    Status Achieved              PT Long Term Goals - 04/10/20 0001      PT LONG TERM GOAL #1   Title Patient will perform bed mobility (rolling and supine <>sit) with Mod I  to increase mobility and  perform ADLs.     Baseline 9/13: able to perform with mod I occasional Min A when very fatigued 7/14: Min A 4/21: unable to perform 6/15: improving but still min-mod A  11/24: min-modA, difficulty breathing with changing positions 1/3: able to perform without assistance    Time 12    Period Weeks    Status Achieved      PT LONG TERM GOAL #2   Title Patient (< 60 years old) will complete five times sit to stand test in < 10 seconds indicating an increased LE strength and improved balance.    Baseline 9/13: 14 seconds with one near LOB 7/14: 14.31 seconds 6/21: 16.86 seconds 4/21: 18.8 seconds 06/02/19: 19.93 seconds one hand on RW, one hand on w/c 12/30: 16 secpmds BUE support 02/11/19: 36 seconds 5/19: 20.6s from [facility], one hand on walker and one on WC, 80% upright posture 7/1: 22 seconds one hand on walker one hand on w/c  9/14: 48 seconds with full upright posture 11/24: 25 seconds with full upright posture, one hand on 2WW 1/3: 18 seocnds with one near LOB    Time 12    Period Weeks    Status Partially Met    Target Date 07/03/20      PT LONG TERM GOAL #3   Title Patient will negotiate a bathroom with a walker and Min A for increased indep in home and external environment    Baseline 9/13: able to some in standing, requires sitting down to wash hands 7/14: unable to do  11/24: able to complete voiding in standing and requires sitting down to wash hands 1/3: able to complete toileting in standing but needs to sit to wash hands.    Time 12    Period Weeks    Status Partially Met    Target Date 07/03/20      PT LONG TERM GOAL #4   Title Patient will increase BLE gross strength to 4+/5 as to improve functional strength for independent gait, increased standing tolerance and increased ADL ability.    Baseline   4/21: see note 06/02/19: hip flexion 4-/5, extension/abd 3/5 12/30: hip flexion 4/5 hip extension and abduction  3/5 02/11/19: hip flexion 4/5 hip extension and abduction 2+/54-/5 RLE, 10/15: 4/5 RLE 11/7: 4/5 RLE 3/3: 4/5 7/1:  hip flexion 4/5 hip extension 2/5 hip abduction/adduciton 2/5     Time 12    Period Weeks    Status Partially Met    Target Date 07/03/20      PT LONG TERM GOAL #5   Title Patient will ambulate 250 ft with least assistive device and Supervision with no breaks to allow for increased mobility within home.    Baseline 1/3: 200 ft no breaks    Time 12    Period Weeks    Status New    Target Date 07/03/20      PT LONG TERM GOAL #7   Title Patient will perform 10 MWT in <30 seconds for improved gait speed, gait mechanics, and functional capacity for mobility.     Baseline 10/11: 48s with BRW and WC follow; 9/13: terminated due to wardrobe malfunction 9/8: 42 seconds with R foot collapse 7/14: 37.98 seconds 6/21: 46.9 seconds with RW 07/28/19: 51 seconds with RW 06/02/19: 62.5 seconds with RW 11/4: 56 seconds with AFO and RW 12/30: 53 seconds with AFO and RW  04/10/20: 42 seconds with AFO and RW    Time 12    Period Weeks    Status Partially Met    Target Date 07/03/20      PT LONG TERM GOAL #8   Title Patient will increase Berg Balance score by > 6 points (38/56) to demonstrate decreased fall risk during functional activities.    Baseline 9/13: 31/56 7/14: 33/56 07/28/19: 32/56 6/21: 32/56 04/10/20: 33/56    Time 12    Period Weeks    Status Partially Met    Target Date 07/03/20                 Plan - 04/26/20 1535    Clinical Impression Statement Pt able to ambulate with 2WW, CGA and WC follow for 90.1 feet today before requiring a prolonged seated rest break. Pt with improved activity tolerance evidenced by ability to perform multiple reps of semi-squats, but pt has difficulty coming to a full upright position upon standing. Pt relies on B UE support for all  standing therex. Pt will benefit from further skilled therapy to increase balance, and B LE strength, endurance, and overall mobility in order to improve activity tolerance and gait.    Rehab Potential Poor    Clinical Impairments Affecting Rehab Potential hx of HTN, HLD, CVA, learning disability, Diabetes, brain tumor,     PT Frequency 2x / week    PT Duration 12 weeks    PT Treatment/Interventions ADLs/Self Care Home Management;Aquatic Therapy;Ultrasound;Moist Heat;Traction;DME Instruction;Gait training;Stair training;Functional mobility training;Therapeutic activities;Therapeutic exercise;Orthotic Fit/Training;Neuromuscular re-education;Balance training;Patient/family education;Manual techniques;Wheelchair mobility training;Passive range of motion;Energy conservation;Taping;Visual/perceptual remediation/compensation    PT Next Visit Plan progress standing balance    PT Home Exercise Plan TrA contraction, glute sets: needs updates. Potentially some time in supine flat in hospital bed, to improve hip extension deficits.     Consulted and Agree with Plan of Care Patient           Patient will benefit from skilled therapeutic intervention in order to improve the following deficits and impairments:  Abnormal gait,Decreased activity tolerance,Decreased balance,Decreased knowledge of precautions,Decreased endurance,Decreased coordination,Decreased knowledge of use of DME,Decreased mobility,Decreased range of motion,Difficulty walking,Decreased safety awareness,Decreased strength,Impaired flexibility,Impaired perceived functional ability,Impaired tone,Postural dysfunction,Improper body mechanics,Pain  Visit Diagnosis: Muscle weakness (generalized)  Other lack  of coordination  Other abnormalities of gait and mobility     Problem List Patient Active Problem List   Diagnosis Date Noted  . Impaired glucose tolerance 03/23/2020  . Learning disability 03/23/2020  . Urinary and fecal incontinence  06/14/2019  . Chronic pain syndrome 03/23/2019  . Constipation 03/23/2019  . Dysarthria 03/23/2019  . Dysphagia 03/23/2019  . Learning difficulty 03/23/2019  . Morbid obesity ([place]) 03/23/2019  . Muscle weakness 03/23/2019  . Vitamin D deficiency 03/23/2019  . Bilateral carotid artery stenosis 12/15/2018  . Moderate aortic valve stenosis 12/15/2018  . BMI 45.0-49.9, adult ([place]) 11/10/2018  . Cerebrovascular accident ([place]) 05/02/2016  . Hemiparesis affecting right side as late effect of cerebrovascular accident ([place]) 05/01/2016  . Brain tumor ([place]) 11/24/2014  . Gastro-esophageal reflux disease without esophagitis 11/24/2014  . Accident due to mechanical fall without injury 08/03/2014  . Essential hypertension 06/13/2014  . Mixed hyperlipidemia 06/13/2014  . Unspecified sequelae of cerebral infarction 06/13/2014  . 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/2014  . Type 2 diabetes mellitus without complication ([place]) 97/94/9971  . Neck mass 09/08/2013  . Swimmer's ear 09/08/2013  . Erectile dysfunction 01/03/2013  . Prediabetes 01/03/2013   Ricard Dillon PT, DPT  04/26/2020, 3:37 PM  [place] MAIN [facility] SERVICES [address] [place], [place], [address] Phone: [phone]   Fax:  [phone]  Name: Curtis Powell MRN: [id number] Date of Birth: [birth year]

## 2020-04-28 ENCOUNTER — Other Ambulatory Visit: Payer: Self-pay | Admitting: Cardiology

## 2020-04-28 DIAGNOSIS — R0609 Other forms of dyspnea: Secondary | ICD-10-CM

## 2020-04-28 DIAGNOSIS — I350 Nonrheumatic aortic (valve) stenosis: Secondary | ICD-10-CM

## 2020-04-28 DIAGNOSIS — R0600 Dyspnea, unspecified: Secondary | ICD-10-CM

## 2020-05-01 ENCOUNTER — Other Ambulatory Visit: Payer: Self-pay

## 2020-05-01 ENCOUNTER — Ambulatory Visit: Payer: Medicare HMO

## 2020-05-01 ENCOUNTER — Ambulatory Visit: Payer: Medicare HMO | Admitting: Occupational Therapy

## 2020-05-01 ENCOUNTER — Encounter: Payer: Self-pay | Admitting: Occupational Therapy

## 2020-05-01 DIAGNOSIS — M6281 Muscle weakness (generalized): Secondary | ICD-10-CM | POA: Diagnosis not present

## 2020-05-01 DIAGNOSIS — R278 Other lack of coordination: Secondary | ICD-10-CM

## 2020-05-01 DIAGNOSIS — R2689 Other abnormalities of gait and mobility: Secondary | ICD-10-CM

## 2020-05-01 NOTE — Therapy (Signed)
[place] MAIN [facility] SERVICES [address] [place], [place], [address] Phone: [phone]   Fax:  [phone]  Physical Therapy Treatment  Patient Details  Name: Curtis Powell MRN: [id number] Date of Birth: 02-05-1966 No data recorded  Encounter Date: 05/01/2020   PT End of Session - 05/01/20 1551    Visit Number [id number]    Number of Visits 284    Date for PT Re-Evaluation 07/03/20    Authorization Type 1/10  PN 04/10/20    PT Start Time 1348    PT Stop Time 1429    PT Time Calculation (min) 41 min    Equipment Utilized During Treatment Gait belt    Activity Tolerance Patient tolerated treatment well;No increased pain;Patient limited by fatigue    Behavior During Therapy [facility] for tasks assessed/performed           Past Medical History:  Diagnosis Date  . GERD (gastroesophageal reflux disease)   . Hyperlipidemia   . Hypertension   . Obesity   . Stroke [facility])     Past Surgical History:  Procedure Laterality Date  . BRAIN SURGERY      There were no vitals filed for this visit.   Subjective Assessment - 05/01/20 1549    Subjective Pt reports no pain. Pt reports doing HEP.    Pertinent History  Patient is a pleasant 55 year old male who presents to physical therapy for weakness and immobility secondary to CVA.  Had a stroke in Feb 2018. He was previously fully independent, but this stroke caused severe residual deficits, mainly on the right side as well as speech, and now he is unable to walk or perform most of his ADLs on his own. Entire right side is very weak. He still has a little difficulty with speech but his swallowing is improved to baseline. His mom had to move in with him and now is his main caregiver.     Limitations Reading    How long can you sit comfortably? 5 minutes    How long can you stand comfortably? 5 minutes    How long can you walk comfortably? 8 minutes    Patient Stated Goals Walk without walker, walk further with  walker, get some strength in back     Currently in Pain? No/denies            Gait training:  Ambulation through clinic 1x52 ft 1x70 ft, 1x28 ft, with CGA and WC follow; brief seated rest breaks between sets. Cuing provided for proximity to RW, upright posture and increased step-length.  Therex: Seated marches 3# AW  - 3x15 B LEs. VC for increased AROM.  LAQ with 3# ankle weights 1x10 B LEs Seated ankle rockers - 1x15 LLE and only DF R LE.  Seated DF - 1x20 B LEs     PT Education - 05/01/20 1550    Education Details Pt educated on technique with seated marches.    Person(s) Educated Patient    Methods Explanation;Demonstration;Verbal cues    Comprehension Verbalized understanding;Returned demonstration            PT Short Term Goals - 01/24/20 1711      PT SHORT TERM GOAL #1   Title Patient will be independent in home exercise program to improve strength/mobility for better functional independence with ADLs.    Baseline hep compliant    Time 2    Period Weeks    Status Achieved  PT SHORT TERM GOAL #2   Title Patient will require min cueing for STS transfer with CGA for increased independence with mobility.     Baseline CGA    Time 2    Period Weeks    Status Achieved      PT SHORT TERM GOAL #3   Title Patient will maintain upright posture for > 15 seconds to demonstrate strengthened postural control muscualture     Baseline 18 seconds     Time 2    Period Weeks    Status Achieved             PT Long Term Goals - 04/10/20 0001      PT LONG TERM GOAL #1   Title Patient will perform bed mobility (rolling and supine <>sit) with Mod I  to increase mobility and  perform ADLs.     Baseline 9/13: able to perform with mod I occasional Min A when very fatigued 7/14: Min A 4/21: unable to perform 6/15: improving but still min-mod A  11/24: min-modA, difficulty breathing with changing positions 1/3: able to perform without assistance    Time 12    Period Weeks     Status Achieved      PT LONG TERM GOAL #2   Title Patient (< 46 years old) will complete five times sit to stand test in < 10 seconds indicating an increased LE strength and improved balance.    Baseline 9/13: 14 seconds with one near LOB 7/14: 14.31 seconds 6/21: 16.86 seconds 4/21: 18.8 seconds 06/02/19: 19.93 seconds one hand on RW, one hand on w/c 12/30: 16 secpmds BUE support 02/11/19: 36 seconds 5/19: 20.6s from [facility], one hand on walker and one on WC, 80% upright posture 7/1: 22 seconds one hand on walker one hand on w/c  9/14: 48 seconds with full upright posture 11/24: 25 seconds with full upright posture, one hand on 2WW 1/3: 18 seocnds with one near LOB    Time 12    Period Weeks    Status Partially Met    Target Date 07/03/20      PT LONG TERM GOAL #3   Title Patient will negotiate a bathroom with a walker and Min A for increased indep in home and external environment    Baseline 9/13: able to some in standing, requires sitting down to wash hands 7/14: unable to do  11/24: able to complete voiding in standing and requires sitting down to wash hands 1/3: able to complete toileting in standing but needs to sit to wash hands.    Time 12    Period Weeks    Status Partially Met    Target Date 07/03/20      PT LONG TERM GOAL #4   Title Patient will increase BLE gross strength to 4+/5 as to improve functional strength for independent gait, increased standing tolerance and increased ADL ability.    Baseline  4/21: see note 06/02/19: hip flexion 4-/5, extension/abd 3/5 12/30: hip flexion 4/5 hip extension and abduction 3/5 02/11/19: hip flexion 4/5 hip extension and abduction 2+/54-/5 RLE, 10/15: 4/5 RLE 11/7: 4/5 RLE 3/3: 4/5 7/1:  hip flexion 4/5 hip extension 2/5 hip abduction/adduciton 2/5     Time 12    Period Weeks    Status Partially Met    Target Date 07/03/20      PT LONG TERM GOAL #5   Title Patient will ambulate 250 ft with least assistive device and Supervision with no breaks  to  allow for increased mobility within home.    Baseline 1/3: 200 ft no breaks    Time 12    Period Weeks    Status New    Target Date 07/03/20      PT LONG TERM GOAL #7   Title Patient will perform 10 MWT in <30 seconds for improved gait speed, gait mechanics, and functional capacity for mobility.     Baseline 10/11: 48s with BRW and WC follow; 9/13: terminated due to wardrobe malfunction 9/8: 42 seconds with R foot collapse 7/14: 37.98 seconds 6/21: 46.9 seconds with RW 07/28/19: 51 seconds with RW 06/02/19: 62.5 seconds with RW 11/4: 56 seconds with AFO and RW 12/30: 53 seconds with AFO and RW  04/10/20: 42 seconds with AFO and RW    Time 12    Period Weeks    Status Partially Met    Target Date 07/03/20      PT LONG TERM GOAL #8   Title Patient will increase Berg Balance score by > 6 points (38/56) to demonstrate decreased fall risk during functional activities.    Baseline 9/13: 31/56 7/14: 33/56 07/28/19: 32/56 6/21: 32/56 04/10/20: 33/56    Time 12    Period Weeks    Status Partially Met    Target Date 07/03/20                 Plan - 05/01/20 1553    Clinical Impression Statement Pt ambulated total of 150 ft (1x52, 1x70, 1x28) this session compared to 90 ft previous session. Last set was limited by fatigue. Pt continues to require cuing for upright posture and proximity to RW. Remainder of session focused on seated therex, which pt was able to perform without further reports of fatigue. Pt will benefit from further skilled therapy to improve mobility, B LE strength and endurance for increased activity tolerance and QOL.    Rehab Potential Poor    Clinical Impairments Affecting Rehab Potential hx of HTN, HLD, CVA, learning disability, Diabetes, brain tumor,     PT Frequency 2x / week    PT Duration 12 weeks    PT Treatment/Interventions ADLs/Self Care Home Management;Aquatic Therapy;Ultrasound;Moist Heat;Traction;DME Instruction;Gait training;Stair training;Functional mobility  training;Therapeutic activities;Therapeutic exercise;Orthotic Fit/Training;Neuromuscular re-education;Balance training;Patient/family education;Manual techniques;Wheelchair mobility training;Passive range of motion;Energy conservation;Taping;Visual/perceptual remediation/compensation    PT Next Visit Plan progress standing balance and volume/distance ambulated    PT Home Exercise Plan TrA contraction, glute sets: needs updates. Potentially some time in supine flat in hospital bed, to improve hip extension deficits.     Consulted and Agree with Plan of Care Patient           Patient will benefit from skilled therapeutic intervention in order to improve the following deficits and impairments:  Abnormal gait,Decreased activity tolerance,Decreased balance,Decreased knowledge of precautions,Decreased endurance,Decreased coordination,Decreased knowledge of use of DME,Decreased mobility,Decreased range of motion,Difficulty walking,Decreased safety awareness,Decreased strength,Impaired flexibility,Impaired perceived functional ability,Impaired tone,Postural dysfunction,Improper body mechanics,Pain  Visit Diagnosis: Muscle weakness (generalized)  Other abnormalities of gait and mobility  Other lack of coordination     Problem List Patient Active Problem List   Diagnosis Date Noted  . Impaired glucose tolerance 03/23/2020  . Learning disability 03/23/2020  . Urinary and fecal incontinence 06/14/2019  . Chronic pain syndrome 03/23/2019  . Constipation 03/23/2019  . Dysarthria 03/23/2019  . Dysphagia 03/23/2019  . Learning difficulty 03/23/2019  . Morbid obesity ([place]) 03/23/2019  . Muscle weakness 03/23/2019  . Vitamin D deficiency 03/23/2019  . Bilateral  carotid artery stenosis 12/15/2018  . Moderate aortic valve stenosis 12/15/2018  . BMI 45.0-49.9, adult ([place]) 11/10/2018  . Cerebrovascular accident ([place]) 05/02/2016  . Hemiparesis affecting right side as late effect of cerebrovascular  accident ([place]) 05/01/2016  . Brain tumor ([place]) 11/24/2014  . Gastro-esophageal reflux disease without esophagitis 11/24/2014  . Accident due to mechanical fall without injury 08/03/2014  . Essential hypertension 06/13/2014  . Mixed hyperlipidemia 06/13/2014  . Unspecified sequelae of cerebral infarction 06/13/2014  . 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/2014  . Type 2 diabetes mellitus without complication ([place]) 59/13/6859  . Neck mass 09/08/2013  . Swimmer's ear 09/08/2013  . Erectile dysfunction 01/03/2013  . Prediabetes 01/03/2013    Ricard Dillon PT, DPT  05/01/2020, 4:07 PM  [place] MAIN [facility] SERVICES [address] [place], [place], [address] Phone: [phone]   Fax:  [phone]  Name: Curtis Powell MRN: [id number] Date of Birth: November 17, 1965

## 2020-05-01 NOTE — Therapy (Signed)
[place] MAIN [facility] SERVICES [address] [place], [place], [address] Phone: [phone]   Fax:  [phone]  Occupational Therapy Treatment  Patient Details  Name: Curtis Powell MRN: [id number] Date of Birth: [birth year] No data recorded  Encounter Date: 05/01/2020   OT End of Session - 05/01/20 1314    Visit Number [id number]    Number of Visits 258    Date for OT Re-Evaluation 06/28/20    OT Start Time 1308    OT Stop Time 1345    OT Time Calculation (min) 37 min    Activity Tolerance Patient tolerated treatment well    Behavior During Therapy [facility] for tasks assessed/performed           Past Medical History:  Diagnosis Date  . GERD (gastroesophageal reflux disease)   . Hyperlipidemia   . Hypertension   . Obesity   . Stroke [facility])     Past Surgical History:  Procedure Laterality Date  . BRAIN SURGERY      There were no vitals filed for this visit.   Subjective Assessment - 05/01/20 1313    Subjective  Pt. reports that he is doing well today.    Patient is accompanied by: Family member    Pertinent History Pt. is a 55 y.o. male who suffered a CVA on 05/01/2016. Pt. was admitted to the hospital. Once discharged, he received Home Health PT and OT services for about a month. Pt. has had multiple CVAs over the past 8 years, and has had multiple falls in the past 6 months. Pt. resides in an apartment. Pt. has caregivers for 80 hours. Pt.'s mother stays with pt. at night, and assists with IADL tasks.    Patient Stated Goals To be able to throw a ball and dribble a ball, do as much as I can for myself.     Currently in Pain? No/denies           OT TREATMENT   Therapeutic Exercise:  Pt. worked on Occupational psychologist with his right hand coloring in a holiday design. Pt. continues to be able to trace the designs, and worked on accuracy with filling in both thin, and wide spaces with moderate deviation over the design  pattern.  Pt.reports that he was not able to get out at all because of the snow. Pt. Continues to require increased time to work on the task. Pt.continues to attend to the full task, and maintain a mature grasp on the pen for the entire duration of the task. Pt. presented with less deviation from the lines on the design. Pt. continues to work on Armed forces training and education officer, and [facility] skills in order to improve, and maximize independence with ADLs, and IADLs.                       OT Education - 05/01/20 1313    Education provided Yes    Person(s) Educated Patient    Methods Explanation;Demonstration    Comprehension Verbalized understanding;Returned demonstration;Verbal cues required            OT Short Term Goals - 03/03/17 1459      OT SHORT TERM GOAL #1   Title `             OT Long Term Goals - 04/05/20 1400      OT LONG TERM GOAL #1   Title Pt. will UE functioning to be able to perfrom toilet hygiene  with minimal assistance.    Baseline 01/14/2018 - Pt completes toileting hygiene independently. Pt requires assistance managing LB clothing.    Time 12    Period Weeks    Status Achieved      OT LONG TERM GOAL #2   Title Pt. will complete self grooming with minA    Baseline 02/24/2018 - Pt is independent with oral care and washing his face and occasionally requires increased time if he is having a bad day. Pt's shaving is completed by his barber.    Time 12    Period Weeks    Status Achieved      OT LONG TERM GOAL #3   Title Pt. will perform self-feeding skills with modified indepndence    Baseline 01/14/2018 - Pt is able to complete self-feeding independently with minimal spillage. Pt is unable to cut meat at this time.    Time 12    Period Weeks    Status Achieved      OT LONG TERM GOAL #4   Title Pt. will perform self dressing with minA and A/E as needed.    Baseline Pt. requires [place] LE dressing. Pt. has difficulty with LE the right LE secondary  to AFO. Pt. continues to be able to donn a T shirt independently with increased time. MinA donning a jacket, Mod A donning pants over his feet, and socks, minA with shoes.    Time 12    Period Weeks    Status On-going    Target Date 06/27/20      OT LONG TERM GOAL #5   Title Pt. will perform light home making tasks with minA    Baseline Pt is able to wash dishes while seated in w/c with increased accessibility of new apartment. Pt. is able to fold his clothes, his moms puts them in the and out of the washer, and dryer, and puts them onto hangers. Caregivers perform vacuuming, and dusting.    Time 12    Period Weeks    Status Achieved      OT LONG TERM GOAL #6   Title Pt. will write his name efficiently with 100% legibility    Baseline Pt. continues to present with 755 legibility with printed form, and 50% legibility with cursive form. Pt. continues to present with 75% legibility with increased time required when printing his name, and 50% legibility when cursive writing his name to sign in when riding the [place]    Time 12    Period Weeks    Status On-going    Target Date 06/27/20      OT LONG TERM GOAL #7   Title Pt will independently and consistently follow HEP to increase UE strength to increase functional independence    Baseline Pt. continues to work towards independence with exercises    Time 12    Period Weeks    Status On-going    Target Date 06/27/20      OT LONG TERM GOAL #8   Title Pt. will require supervision ironing a shirt.    Baseline Pt. has progressed CGA, and complete set-up seated using a tabletop iron    Time 12    Period Weeks    Status Partially Met    Target Date 06/27/20      OT LONG TERM GOAL  #9   Baseline Pt. will independently be able to sew a button onto a shirt.    Time 12    Period Weeks  Status On-going    Target Date 06/27/20      OT LONG TERM GOAL  #10   TITLE Pt. will independently be able to throw a ball    Baseline Pt. continues to be  limited with throwing overhand, however pt. is able to perform underhand throwing.    Time 12    Period Weeks    Status On-going    Target Date 06/27/20      OT LONG TERM GOAL  #11   TITLE Pt. will improve UE functional reaching to be able to independently use his RUE to hand his clothes in the closest.    Baseline Pt. uses a reacher to complete. Pt. continues to improve with reaching up with the RUE, however has difficulty reaching the hanger rack in the closet. Pt. now uses a reacher.    Time 12    Period Weeks    Status On-going    Target Date 06/27/20      OT LONG TERM GOAL  #12   TITLE Pt. will  be independent with shaving using an electric razer.    Baseline Pt. is able to use the electric razer independently, however is unable to access the position of the plug and requires assist form caregivers secondary to the size of the bathroom, and location of the plug.    Time 12    Period Weeks    Status Achieved      OT LONG TERM GOAL  #13   TITLE Pt. will increase BUE strength to be able to prepare for assisting with yardwork.    Baseline Pt. BUE strength conitnues to be 4+/5 Pt. reports that he has been able to get out to the front.    Time 12    Period Weeks    Status On-going      OT LONG TERM GOAL  #14   TITLE Pt. will improve activity toelrance in standing to be able to complete standing ADLs, and IADLs with SBA.    Baseline Pt. is limited with ADL performance in standing.    Time 12    Period Weeks    Status On-going    Target Date 06/27/20                 Plan - 05/01/20 1315    Clinical Impression Statement Pt.reports that he was not able to get out at all because of the snow. Pt. Continues to require increased time to work on the task. Pt.continues to attend to the full task, and maintain a mature grasp on the pen for the entire duration of the task. Pt. presented with less deviation from the lines on the design. Pt. continues to work on English as a second language teacher, and [facility] skills in order to improve, and maximize independence with ADLs, and IADLs.   OT Occupational Profile and History Problem Focused Assessment - Including review of records relating to presenting problem    Occupational performance deficits (Please refer to evaluation for details): ADL's;IADL's    Body Structure / Function / Physical Skills ADL;FMC;ROM;IADL;Pain;Coordination    Rehab Potential Fair    Clinical Decision Making Several treatment options, min-mod task modification necessary    Modification or Assistance to Complete Evaluation  Min-Moderate modification of tasks or assist with assess necessary to complete eval    OT Frequency 1x / week    OT Duration 12 weeks    OT Treatment/Interventions Self-care/ADL training;Patient/family education;DME and/or AE instruction;Therapeutic exercise;Neuromuscular education    Consulted  and Agree with Plan of Care Patient           Patient will benefit from skilled therapeutic intervention in order to improve the following deficits and impairments:   Body Structure / Function / Physical Skills: ADL,FMC,ROM,IADL,Pain,Coordination       Visit Diagnosis: Muscle weakness (generalized)    Problem List Patient Active Problem List   Diagnosis Date Noted  . Impaired glucose tolerance 03/23/2020  . Learning disability 03/23/2020  . Urinary and fecal incontinence 06/14/2019  . Chronic pain syndrome 03/23/2019  . Constipation 03/23/2019  . Dysarthria 03/23/2019  . Dysphagia 03/23/2019  . Learning difficulty 03/23/2019  . Morbid obesity ([place]) 03/23/2019  . Muscle weakness 03/23/2019  . Vitamin D deficiency 03/23/2019  . Bilateral carotid artery stenosis 12/15/2018  . Moderate aortic valve stenosis 12/15/2018  . BMI 45.0-49.9, adult ([place]) 11/10/2018  . Cerebrovascular accident ([place]) 05/02/2016  . Hemiparesis affecting right side as late effect of cerebrovascular accident ([place]) 05/01/2016  . Brain tumor ([place]) 11/24/2014  .  Gastro-esophageal reflux disease without esophagitis 11/24/2014  . Accident due to mechanical fall without injury 08/03/2014  . Essential hypertension 06/13/2014  . Mixed hyperlipidemia 06/13/2014  . Unspecified sequelae of cerebral infarction 06/13/2014  . 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/2014  . Type 2 diabetes mellitus without complication ([place]) 12/81/1886  . Neck mass 09/08/2013  . Swimmer's ear 09/08/2013  . Erectile dysfunction 01/03/2013  . Prediabetes 01/03/2013    Harrel Carina, MS, OTR/L 05/01/2020, 1:46 PM  [place] MAIN [facility] SERVICES [address] [place], [place], [address] Phone: [phone]   Fax:  [phone]  Name: Curtis Powell MRN: [id number] Date of Birth: Dec 30, 1965

## 2020-05-03 ENCOUNTER — Ambulatory Visit: Payer: Medicare HMO

## 2020-05-03 ENCOUNTER — Encounter: Payer: Medicare HMO | Admitting: Occupational Therapy

## 2020-05-03 DIAGNOSIS — R2689 Other abnormalities of gait and mobility: Secondary | ICD-10-CM

## 2020-05-03 DIAGNOSIS — M6281 Muscle weakness (generalized): Secondary | ICD-10-CM | POA: Diagnosis not present

## 2020-05-03 DIAGNOSIS — R278 Other lack of coordination: Secondary | ICD-10-CM

## 2020-05-03 NOTE — Therapy (Signed)
[place] MAIN [facility] SERVICES [address] [place], [place], [address] Phone: [phone]   Fax:  [phone]  Physical Therapy Treatment  Patient Details  Name: Curtis Powell MRN: [id number] Date of Birth: 03/11/1966 No data recorded  Encounter Date: 05/03/2020   PT End of Session - 05/03/20 1410    Visit Number [id number]    Number of Visits 284    Date for PT Re-Evaluation 07/03/20    Authorization Type 1/10  PN 04/10/20    PT Start Time 1300    PT Stop Time 1348    PT Time Calculation (min) 48 min    Equipment Utilized During Treatment Gait belt    Activity Tolerance Patient tolerated treatment well;No increased pain    Behavior During Therapy WFL for tasks assessed/performed           Past Medical History:  Diagnosis Date  . GERD (gastroesophageal reflux disease)   . Hyperlipidemia   . Hypertension   . Obesity   . Stroke [facility])     Past Surgical History:  Procedure Laterality Date  . BRAIN SURGERY      There were no vitals filed for this visit.   Subjective Assessment - 05/03/20 1257    Subjective Pt denies any pain and reports he "felt good" after last session. Pt reports HEP is going well.    Pertinent History  Patient is a pleasant 55 year old male who presents to physical therapy for weakness and immobility secondary to CVA.  Had a stroke in Feb 2018. He was previously fully independent, but this stroke caused severe residual deficits, mainly on the right side as well as speech, and now he is unable to walk or perform most of his ADLs on his own. Entire right side is very weak. He still has a little difficulty with speech but his swallowing is improved to baseline. His mom had to move in with him and now is his main caregiver.     Limitations Reading    How long can you sit comfortably? 5 minutes    How long can you stand comfortably? 5 minutes    How long can you walk comfortably? 8 minutes    Patient Stated Goals Walk  without walker, walk further with walker, get some strength in back     Currently in Pain? No/denies          Gait training:  Ambulation through clinic 127 ft , with CGA and WC follow; brief seated rest breaks at end. Cuing provided for proximity to RW, upright posture and increased step-length.  Therapeutic Exercise:  Seated RTB hip abduction with VC/TC for ROM - 2x12 Seated DF -1x15 Seated DF with manual resistance applied to L LE 2x15 Seated marches 3# AW  - 3x15 B LEs. VC/TC for increased AROM.  LAQ with 3# ankle weights 2x10 B LEs; brief eated rest break between sets Sit<>stand from w/c<>standard chair both directions - 1x4 Pt reports easier to perform going to L side d/t R LE weakness.  Assessment: Pt demonstrates progress today by ambulating for 127 ft uninterrupted with WC-follow and CGA, indicating carryover between sessions and improved endurance. However, pt does still require frequent cuing for proximity to RW and upright posture. Pt will continue to benefit from further skilled therapy to improve B LE strength, endurance and mobility in order to ease with ADLs and gait.     PT Short Term Goals - 01/24/20 1711  PT SHORT TERM GOAL #1   Title Patient will be independent in home exercise program to improve strength/mobility for better functional independence with ADLs.    Baseline hep compliant    Time 2    Period Weeks    Status Achieved      PT SHORT TERM GOAL #2   Title Patient will require min cueing for STS transfer with CGA for increased independence with mobility.     Baseline CGA    Time 2    Period Weeks    Status Achieved      PT SHORT TERM GOAL #3   Title Patient will maintain upright posture for > 15 seconds to demonstrate strengthened postural control muscualture     Baseline 18 seconds     Time 2    Period Weeks    Status Achieved             PT Long Term Goals - 04/10/20 0001      PT LONG TERM GOAL #1   Title Patient will perform bed  mobility (rolling and supine <>sit) with Mod I  to increase mobility and  perform ADLs.     Baseline 9/13: able to perform with mod I occasional Min A when very fatigued 7/14: Min A 4/21: unable to perform 6/15: improving but still min-mod A  11/24: min-modA, difficulty breathing with changing positions 1/3: able to perform without assistance    Time 12    Period Weeks    Status Achieved      PT LONG TERM GOAL #2   Title Patient (< 2 years old) will complete five times sit to stand test in < 10 seconds indicating an increased LE strength and improved balance.    Baseline 9/13: 14 seconds with one near LOB 7/14: 14.31 seconds 6/21: 16.86 seconds 4/21: 18.8 seconds 06/02/19: 19.93 seconds one hand on RW, one hand on w/c 12/30: 16 secpmds BUE support 02/11/19: 36 seconds 5/19: 20.6s from [facility], one hand on walker and one on WC, 80% upright posture 7/1: 22 seconds one hand on walker one hand on w/c  9/14: 48 seconds with full upright posture 11/24: 25 seconds with full upright posture, one hand on 2WW 1/3: 18 seocnds with one near LOB    Time 12    Period Weeks    Status Partially Met    Target Date 07/03/20      PT LONG TERM GOAL #3   Title Patient will negotiate a bathroom with a walker and Min A for increased indep in home and external environment    Baseline 9/13: able to some in standing, requires sitting down to wash hands 7/14: unable to do  11/24: able to complete voiding in standing and requires sitting down to wash hands 1/3: able to complete toileting in standing but needs to sit to wash hands.    Time 12    Period Weeks    Status Partially Met    Target Date 07/03/20      PT LONG TERM GOAL #4   Title Patient will increase BLE gross strength to 4+/5 as to improve functional strength for independent gait, increased standing tolerance and increased ADL ability.    Baseline  4/21: see note 06/02/19: hip flexion 4-/5, extension/abd 3/5 12/30: hip flexion 4/5 hip extension and abduction 3/5  02/11/19: hip flexion 4/5 hip extension and abduction 2+/54-/5 RLE, 10/15: 4/5 RLE 11/7: 4/5 RLE 3/3: 4/5 7/1:  hip flexion 4/5 hip extension 2/5 hip  abduction/adduciton 2/5     Time 12    Period Weeks    Status Partially Met    Target Date 07/03/20      PT LONG TERM GOAL #5   Title Patient will ambulate 250 ft with least assistive device and Supervision with no breaks to allow for increased mobility within home.    Baseline 1/3: 200 ft no breaks    Time 12    Period Weeks    Status New    Target Date 07/03/20      PT LONG TERM GOAL #7   Title Patient will perform 10 MWT in <30 seconds for improved gait speed, gait mechanics, and functional capacity for mobility.     Baseline 10/11: 48s with BRW and WC follow; 9/13: terminated due to wardrobe malfunction 9/8: 42 seconds with R foot collapse 7/14: 37.98 seconds 6/21: 46.9 seconds with RW 07/28/19: 51 seconds with RW 06/02/19: 62.5 seconds with RW 11/4: 56 seconds with AFO and RW 12/30: 53 seconds with AFO and RW  04/10/20: 42 seconds with AFO and RW    Time 12    Period Weeks    Status Partially Met    Target Date 07/03/20      PT LONG TERM GOAL #8   Title Patient will increase Berg Balance score by > 6 points (38/56) to demonstrate decreased fall risk during functional activities.    Baseline 9/13: 31/56 7/14: 33/56 07/28/19: 32/56 6/21: 32/56 04/10/20: 33/56    Time 12    Period Weeks    Status Partially Met    Target Date 07/03/20                 Plan - 05/03/20 1419    Clinical Impression Statement Pt demonstrates progress today by ambulating for 127 ft uninterrupted with WC-follow and CGA, indicating carryover between sessions and improved endurance. However, pt does still require frequent cuing for proximity to RW and upright posture. Pt will continue to benefit from further skilled therapy to improve B LE strength, endurance and mobility in order to ease with ADLs and gait.    Rehab Potential Poor    Clinical Impairments  Affecting Rehab Potential hx of HTN, HLD, CVA, learning disability, Diabetes, brain tumor,     PT Frequency 2x / week    PT Duration 12 weeks    PT Treatment/Interventions ADLs/Self Care Home Management;Aquatic Therapy;Ultrasound;Moist Heat;Traction;DME Instruction;Gait training;Stair training;Functional mobility training;Therapeutic activities;Therapeutic exercise;Orthotic Fit/Training;Neuromuscular re-education;Balance training;Patient/family education;Manual techniques;Wheelchair mobility training;Passive range of motion;Energy conservation;Taping;Visual/perceptual remediation/compensation    PT Next Visit Plan progress standing balance exercises and transfer training    PT Home Exercise Plan TrA contraction, glute sets: needs updates. Potentially some time in supine flat in hospital bed, to improve hip extension deficits.     Consulted and Agree with Plan of Care Patient           Patient will benefit from skilled therapeutic intervention in order to improve the following deficits and impairments:  Abnormal gait,Decreased activity tolerance,Decreased balance,Decreased knowledge of precautions,Decreased endurance,Decreased coordination,Decreased knowledge of use of DME,Decreased mobility,Decreased range of motion,Difficulty walking,Decreased safety awareness,Decreased strength,Impaired flexibility,Impaired perceived functional ability,Impaired tone,Postural dysfunction,Improper body mechanics,Pain  Visit Diagnosis: Muscle weakness (generalized)  Other abnormalities of gait and mobility  Other lack of coordination     Problem List Patient Active Problem List   Diagnosis Date Noted  . Impaired glucose tolerance 03/23/2020  . Learning disability 03/23/2020  . Urinary and fecal incontinence 06/14/2019  . Chronic pain syndrome  03/23/2019  . Constipation 03/23/2019  . Dysarthria 03/23/2019  . Dysphagia 03/23/2019  . Learning difficulty 03/23/2019  . Morbid obesity ([place]) 03/23/2019  .  Muscle weakness 03/23/2019  . Vitamin D deficiency 03/23/2019  . Bilateral carotid artery stenosis 12/15/2018  . Moderate aortic valve stenosis 12/15/2018  . BMI 45.0-49.9, adult ([place]) 11/10/2018  . Cerebrovascular accident ([place]) 05/02/2016  . Hemiparesis affecting right side as late effect of cerebrovascular accident ([place]) 05/01/2016  . Brain tumor ([place]) 11/24/2014  . Gastro-esophageal reflux disease without esophagitis 11/24/2014  . Accident due to mechanical fall without injury 08/03/2014  . Essential hypertension 06/13/2014  . Mixed hyperlipidemia 06/13/2014  . Unspecified sequelae of cerebral infarction 06/13/2014  . 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/2014  . Type 2 diabetes mellitus without complication ([place]) 82/11/1386  . Neck mass 09/08/2013  . Swimmer's ear 09/08/2013  . Erectile dysfunction 01/03/2013  . Prediabetes 01/03/2013   Ricard Dillon PT, DPT  05/03/2020, 2:23 PM  [place] MAIN [facility] SERVICES [address] [place], [place], [address] Phone: [phone]   Fax:  [phone]  Name: Curtis Powell MRN: [id number] Date of Birth: 27-Jan-1966

## 2020-05-04 ENCOUNTER — Other Ambulatory Visit: Payer: Self-pay

## 2020-05-04 ENCOUNTER — Ambulatory Visit
Admission: RE | Admit: 2020-05-04 | Discharge: 2020-05-04 | Disposition: A | Discharge status: Home / Self Care | Payer: Medicare HMO | Source: Ambulatory Visit | Attending: Urology | Admitting: Urology

## 2020-05-04 DIAGNOSIS — N5082 Scrotal pain: Secondary | ICD-10-CM | POA: Diagnosis not present

## 2020-05-05 ENCOUNTER — Telehealth: Payer: Self-pay

## 2020-05-05 NOTE — Telephone Encounter (Signed)
Called pt informed him of the information below. Pt gave verbal understanding, and denies any pain or symptoms currently. Advised pt to call back if anything changes.

## 2020-05-05 NOTE — Telephone Encounter (Signed)
-----   Message from Billey Co, MD sent at 05/04/2020  6:11 PM EST ----- Scrotal ultrasound completely normal.  If he still having any scrotal pain, can offer follow-up with PA, thanks  Nickolas Madrid, MD 05/04/2020

## 2020-05-08 ENCOUNTER — Ambulatory Visit: Payer: Medicare HMO

## 2020-05-08 ENCOUNTER — Ambulatory Visit: Payer: Medicare HMO | Admitting: Occupational Therapy

## 2020-05-10 ENCOUNTER — Ambulatory Visit: Payer: Medicare Other | Attending: Family Medicine | Admitting: Physical Therapy

## 2020-05-10 ENCOUNTER — Other Ambulatory Visit: Payer: Self-pay

## 2020-05-10 ENCOUNTER — Encounter: Payer: Medicare HMO | Admitting: Occupational Therapy

## 2020-05-10 DIAGNOSIS — R278 Other lack of coordination: Secondary | ICD-10-CM | POA: Diagnosis present

## 2020-05-10 DIAGNOSIS — I69351 Hemiplegia and hemiparesis following cerebral infarction affecting right dominant side: Secondary | ICD-10-CM | POA: Insufficient documentation

## 2020-05-10 DIAGNOSIS — R2689 Other abnormalities of gait and mobility: Secondary | ICD-10-CM | POA: Diagnosis present

## 2020-05-10 DIAGNOSIS — M6281 Muscle weakness (generalized): Secondary | ICD-10-CM | POA: Diagnosis present

## 2020-05-10 DIAGNOSIS — R2681 Unsteadiness on feet: Secondary | ICD-10-CM | POA: Diagnosis present

## 2020-05-10 DIAGNOSIS — R531 Weakness: Secondary | ICD-10-CM | POA: Diagnosis present

## 2020-05-10 NOTE — Therapy (Signed)
[place] MAIN [facility] SERVICES [address] [place], [place], [address] Phone: [phone]   Fax:  [phone]  Physical Therapy Treatment  Patient Details  Name: Curtis Powell MRN: [id number] Date of Birth: 1965/10/11 No data recorded  Encounter Date: 05/10/2020   PT End of Session - 05/10/20 1251    Visit Number [id number]    Number of Visits 284    Date for PT Re-Evaluation 07/03/20    Authorization Type 1/10  PN 04/10/20    PT Start Time 1300    PT Stop Time 1345    PT Time Calculation (min) 45 min    Equipment Utilized During Treatment Gait belt    Activity Tolerance Patient tolerated treatment well;No increased pain    Behavior During Therapy WFL for tasks assessed/performed           Past Medical History:  Diagnosis Date  . GERD (gastroesophageal reflux disease)   . Hyperlipidemia   . Hypertension   . Obesity   . Stroke [facility])     Past Surgical History:  Procedure Laterality Date  . BRAIN SURGERY      There were no vitals filed for this visit.   Subjective Assessment - 05/10/20 1301    Subjective No new symptoms or injuries noted since last therapy session. He reports of being compliant with his HEP.    Pertinent History  Patient is a pleasant 55 year old male who presents to physical therapy for weakness and immobility secondary to CVA.  Had a stroke in Feb 2018. He was previously fully independent, but this stroke caused severe residual deficits, mainly on the right side as well as speech, and now he is unable to walk or perform most of his ADLs on his own. Entire right side is very weak. He still has a little difficulty with speech but his swallowing is improved to baseline. His mom had to move in with him and now is his main caregiver.     Limitations Reading    How long can you sit comfortably? 5 minutes    How long can you stand comfortably? 5 minutes    How long can you walk comfortably? 8 minutes    Patient Stated Goals  Walk without walker, walk further with walker, get some strength in back     Currently in Pain? No/denies          Gait training:  Ambulation through clinic 150 ft , with CGA and WC follow; brief seated rest breaks at end. Cuing provided for proximity to RW, upright posture and increased step-length.  Therapeutic Exercise: Triceps dips in chair x 10 reps  Seated hamstring curls with GTB x 15 reps Seated 6 inch stool taps x 2 10 reps each LE Seated hip abduction x 2 10 reps each LE  Seated LAQ x 10 reps each LE      PT Short Term Goals - 01/24/20 1711      PT SHORT TERM GOAL #1   Title Patient will be independent in home exercise program to improve strength/mobility for better functional independence with ADLs.    Baseline hep compliant    Time 2    Period Weeks    Status Achieved      PT SHORT TERM GOAL #2   Title Patient will require min cueing for STS transfer with CGA for increased independence with mobility.     Baseline CGA    Time 2    Period  Weeks    Status Achieved      PT SHORT TERM GOAL #3   Title Patient will maintain upright posture for > 15 seconds to demonstrate strengthened postural control muscualture     Baseline 18 seconds     Time 2    Period Weeks    Status Achieved             PT Long Term Goals - 04/10/20 0001      PT LONG TERM GOAL #1   Title Patient will perform bed mobility (rolling and supine <>sit) with Mod I  to increase mobility and  perform ADLs.     Baseline 9/13: able to perform with mod I occasional Min A when very fatigued 7/14: Min A 4/21: unable to perform 6/15: improving but still min-mod A  11/24: min-modA, difficulty breathing with changing positions 1/3: able to perform without assistance    Time 12    Period Weeks    Status Achieved      PT LONG TERM GOAL #2   Title Patient (< 56 years old) will complete five times sit to stand test in < 10 seconds indicating an increased LE strength and improved balance.    Baseline  9/13: 14 seconds with one near LOB 7/14: 14.31 seconds 6/21: 16.86 seconds 4/21: 18.8 seconds 06/02/19: 19.93 seconds one hand on RW, one hand on w/c 12/30: 16 secpmds BUE support 02/11/19: 36 seconds 5/19: 20.6s from [facility], one hand on walker and one on WC, 80% upright posture 7/1: 22 seconds one hand on walker one hand on w/c  9/14: 48 seconds with full upright posture 11/24: 25 seconds with full upright posture, one hand on 2WW 1/3: 18 seocnds with one near LOB    Time 12    Period Weeks    Status Partially Met    Target Date 07/03/20      PT LONG TERM GOAL #3   Title Patient will negotiate a bathroom with a walker and Min A for increased indep in home and external environment    Baseline 9/13: able to some in standing, requires sitting down to wash hands 7/14: unable to do  11/24: able to complete voiding in standing and requires sitting down to wash hands 1/3: able to complete toileting in standing but needs to sit to wash hands.    Time 12    Period Weeks    Status Partially Met    Target Date 07/03/20      PT LONG TERM GOAL #4   Title Patient will increase BLE gross strength to 4+/5 as to improve functional strength for independent gait, increased standing tolerance and increased ADL ability.    Baseline  4/21: see note 06/02/19: hip flexion 4-/5, extension/abd 3/5 12/30: hip flexion 4/5 hip extension and abduction 3/5 02/11/19: hip flexion 4/5 hip extension and abduction 2+/54-/5 RLE, 10/15: 4/5 RLE 11/7: 4/5 RLE 3/3: 4/5 7/1:  hip flexion 4/5 hip extension 2/5 hip abduction/adduciton 2/5     Time 12    Period Weeks    Status Partially Met    Target Date 07/03/20      PT LONG TERM GOAL #5   Title Patient will ambulate 250 ft with least assistive device and Supervision with no breaks to allow for increased mobility within home.    Baseline 1/3: 200 ft no breaks    Time 12    Period Weeks    Status New    Target Date 07/03/20  PT LONG TERM GOAL #7   Title Patient will perform 10 MWT  in <30 seconds for improved gait speed, gait mechanics, and functional capacity for mobility.     Baseline 10/11: 48s with BRW and WC follow; 9/13: terminated due to wardrobe malfunction 9/8: 42 seconds with R foot collapse 7/14: 37.98 seconds 6/21: 46.9 seconds with RW 07/28/19: 51 seconds with RW 06/02/19: 62.5 seconds with RW 11/4: 56 seconds with AFO and RW 12/30: 53 seconds with AFO and RW  04/10/20: 42 seconds with AFO and RW    Time 12    Period Weeks    Status Partially Met    Target Date 07/03/20      PT LONG TERM GOAL #8   Title Patient will increase Berg Balance score by > 6 points (38/56) to demonstrate decreased fall risk during functional activities.    Baseline 9/13: 31/56 7/14: 33/56 07/28/19: 32/56 6/21: 32/56 04/10/20: 33/56    Time 12    Period Weeks    Status Partially Met    Target Date 07/03/20                 Plan - 05/10/20 1541    Clinical Impression Statement Patient ambulated 150 feet with one seated rest break for less than 2 minutes requiring close CGA and 2WW. He demonstrates improved upright posture and increased step and stride length requiring less verbal cues for reminders. Patient completed strengthening exercies with no increase in pain and no report sof muscle fatigue. Patient would continue to benefit from PT services to increase strength, mobility, and balance skills to improve patient's quality of life.    Rehab Potential Poor    Clinical Impairments Affecting Rehab Potential hx of HTN, HLD, CVA, learning disability, Diabetes, brain tumor,     PT Frequency 2x / week    PT Duration 12 weeks    PT Treatment/Interventions ADLs/Self Care Home Management;Aquatic Therapy;Ultrasound;Moist Heat;Traction;DME Instruction;Gait training;Stair training;Functional mobility training;Therapeutic activities;Therapeutic exercise;Orthotic Fit/Training;Neuromuscular re-education;Balance training;Patient/family education;Manual techniques;Wheelchair mobility training;Passive  range of motion;Energy conservation;Taping;Visual/perceptual remediation/compensation    PT Next Visit Plan progress standing balance exercises and transfer training    PT Home Exercise Plan TrA contraction, glute sets: needs updates. Potentially some time in supine flat in hospital bed, to improve hip extension deficits.     Consulted and Agree with Plan of Care Patient           Patient will benefit from skilled therapeutic intervention in order to improve the following deficits and impairments:  Abnormal gait,Decreased activity tolerance,Decreased balance,Decreased knowledge of precautions,Decreased endurance,Decreased coordination,Decreased knowledge of use of DME,Decreased mobility,Decreased range of motion,Difficulty walking,Decreased safety awareness,Decreased strength,Impaired flexibility,Impaired perceived functional ability,Impaired tone,Postural dysfunction,Improper body mechanics,Pain  Visit Diagnosis: Muscle weakness (generalized)  Other abnormalities of gait and mobility  Other lack of coordination  Left-sided weakness     Problem List Patient Active Problem List   Diagnosis Date Noted  . Impaired glucose tolerance 03/23/2020  . Learning disability 03/23/2020  . Urinary and fecal incontinence 06/14/2019  . Chronic pain syndrome 03/23/2019  . Constipation 03/23/2019  . Dysarthria 03/23/2019  . Dysphagia 03/23/2019  . Learning difficulty 03/23/2019  . Morbid obesity ([place]) 03/23/2019  . Muscle weakness 03/23/2019  . Vitamin D deficiency 03/23/2019  . Bilateral carotid artery stenosis 12/15/2018  . Moderate aortic valve stenosis 12/15/2018  . BMI 45.0-49.9, adult ([employer]) 11/10/2018  . Cerebrovascular accident ([place]) 05/02/2016  . Hemiparesis affecting right side as late effect of cerebrovascular accident ([place]) 05/01/2016  .  Brain tumor ([place]) 11/24/2014  . Gastro-esophageal reflux disease without esophagitis 11/24/2014  . Accident due to mechanical fall without  injury 08/03/2014  . Essential hypertension 06/13/2014  . Mixed hyperlipidemia 06/13/2014  . Unspecified sequelae of cerebral infarction 06/13/2014  . 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/2014  . Type 2 diabetes mellitus without complication ([place]) 84/13/2440  . Neck mass 09/08/2013  . Swimmer's ear 09/08/2013  . Erectile dysfunction 01/03/2013  . Prediabetes 01/03/2013   Karl Luke PT, DPT Netta Corrigan 05/10/2020, 3:46 PM  [place] MAIN [facility] SERVICES [address] [place], [place], [address] Phone: [phone]   Fax:  [phone]  Name: Curtis Powell MRN: [id number] Date of Birth: 17-Nov-1965

## 2020-05-11 ENCOUNTER — Other Ambulatory Visit: Payer: Self-pay

## 2020-05-11 ENCOUNTER — Ambulatory Visit
Admission: RE | Admit: 2020-05-11 | Discharge: 2020-05-11 | Disposition: A | Discharge status: Home / Self Care | Payer: Medicare Other | Source: Ambulatory Visit | Attending: Cardiology | Admitting: Cardiology

## 2020-05-11 ENCOUNTER — Ambulatory Visit: Payer: Medicare HMO

## 2020-05-11 DIAGNOSIS — I350 Nonrheumatic aortic (valve) stenosis: Secondary | ICD-10-CM | POA: Diagnosis not present

## 2020-05-11 DIAGNOSIS — I10 Essential (primary) hypertension: Secondary | ICD-10-CM | POA: Diagnosis not present

## 2020-05-11 DIAGNOSIS — E785 Hyperlipidemia, unspecified: Secondary | ICD-10-CM | POA: Diagnosis not present

## 2020-05-11 DIAGNOSIS — R0609 Other forms of dyspnea: Secondary | ICD-10-CM

## 2020-05-11 DIAGNOSIS — R0600 Dyspnea, unspecified: Secondary | ICD-10-CM | POA: Diagnosis present

## 2020-05-11 DIAGNOSIS — I639 Cerebral infarction, unspecified: Secondary | ICD-10-CM | POA: Insufficient documentation

## 2020-05-11 LAB — ECHOCARDIOGRAM COMPLETE: S' Lateral: 2.36 cm

## 2020-05-11 NOTE — Progress Notes (Signed)
*  PRELIMINARY RESULTS* Echocardiogram 2D Echocardiogram has been performed.  Sherrie Sport 05/11/2020, 11:07 AM

## 2020-05-15 ENCOUNTER — Other Ambulatory Visit: Payer: Self-pay

## 2020-05-15 ENCOUNTER — Ambulatory Visit: Payer: Medicare Other

## 2020-05-15 ENCOUNTER — Ambulatory Visit: Payer: Medicare Other | Admitting: Occupational Therapy

## 2020-05-15 DIAGNOSIS — R278 Other lack of coordination: Secondary | ICD-10-CM

## 2020-05-15 DIAGNOSIS — I69351 Hemiplegia and hemiparesis following cerebral infarction affecting right dominant side: Secondary | ICD-10-CM

## 2020-05-15 DIAGNOSIS — M6281 Muscle weakness (generalized): Secondary | ICD-10-CM | POA: Diagnosis not present

## 2020-05-15 DIAGNOSIS — R2689 Other abnormalities of gait and mobility: Secondary | ICD-10-CM

## 2020-05-15 DIAGNOSIS — R531 Weakness: Secondary | ICD-10-CM

## 2020-05-15 NOTE — Therapy (Signed)
[place] MAIN [facility] SERVICES [address] [place], [place], [address] Phone: [phone]   Fax:  [phone]  Physical Therapy Treatment  Patient Details  Name: Curtis Powell MRN: [id number] Date of Birth: [birth year] No data recorded  Encounter Date: 05/15/2020   PT End of Session - 05/15/20 1358    Visit Number [id number]    Number of Visits 284    Date for PT Re-Evaluation 07/03/20    Authorization Type 1/10  PN 04/10/20    PT Start Time 1300    PT Stop Time 1344    PT Time Calculation (min) 44 min    Equipment Utilized During Treatment Gait belt    Activity Tolerance Patient tolerated treatment well;No increased pain    Behavior During Therapy WFL for tasks assessed/performed           Past Medical History:  Diagnosis Date  . GERD (gastroesophageal reflux disease)   . Hyperlipidemia   . Hypertension   . Obesity   . Stroke [facility])     Past Surgical History:  Procedure Laterality Date  . BRAIN SURGERY      There were no vitals filed for this visit.   Subjective Assessment - 05/15/20 1305    Subjective Pt reports exercises going OK at home. Pt reports no pain.    Pertinent History  Patient is a pleasant 55 year old male who presents to physical therapy for weakness and immobility secondary to CVA.  Had a stroke in Feb 2018. He was previously fully independent, but this stroke caused severe residual deficits, mainly on the right side as well as speech, and now he is unable to walk or perform most of his ADLs on his own. Entire right side is very weak. He still has a little difficulty with speech but his swallowing is improved to baseline. His mom had to move in with him and now is his main caregiver.     Limitations Reading    How long can you sit comfortably? 5 minutes    How long can you stand comfortably? 5 minutes    How long can you walk comfortably? 8 minutes    Patient Stated Goals Walk without walker, walk further with walker,  get some strength in back     Currently in Pain? No/denies            Therapeutic Exercise:  Ambulation for LE/cardiovascular endurance through clinic: 1x77 ft 1x70 ft. With RW, WC-follow and CGA. VC/TC for upright posture/technique with RW. Brief seated rest between sets  Cuing for transfer from WC<>stand 2x between sets of amb. For eccentric control with sitting and for hip extension with standing.  STS - 3x5, BUE assist, CGA; pt rates "medium" difficulty with first set; pt rates 3rd set "hard." VC/TC for upright posture/hip extension and eccentric control with stand>sit.     PT Short Term Goals - 01/24/20 1711      PT SHORT TERM GOAL #1   Title Patient will be independent in home exercise program to improve strength/mobility for better functional independence with ADLs.    Baseline hep compliant    Time 2    Period Weeks    Status Achieved      PT SHORT TERM GOAL #2   Title Patient will require min cueing for STS transfer with CGA for increased independence with mobility.     Baseline CGA    Time 2    Period Weeks  Status Achieved      PT SHORT TERM GOAL #3   Title Patient will maintain upright posture for > 15 seconds to demonstrate strengthened postural control muscualture     Baseline 18 seconds     Time 2    Period Weeks    Status Achieved             PT Long Term Goals - 04/10/20 0001      PT LONG TERM GOAL #1   Title Patient will perform bed mobility (rolling and supine <>sit) with Mod I  to increase mobility and  perform ADLs.     Baseline 9/13: able to perform with mod I occasional Min A when very fatigued 7/14: Min A 4/21: unable to perform 6/15: improving but still min-mod A  11/24: min-modA, difficulty breathing with changing positions 1/3: able to perform without assistance    Time 12    Period Weeks    Status Achieved      PT LONG TERM GOAL #2   Title Patient (< 78 years old) will complete five times sit to stand test in < 10 seconds  indicating an increased LE strength and improved balance.    Baseline 9/13: 14 seconds with one near LOB 7/14: 14.31 seconds 6/21: 16.86 seconds 4/21: 18.8 seconds 06/02/19: 19.93 seconds one hand on RW, one hand on w/c 12/30: 16 secpmds BUE support 02/11/19: 36 seconds 5/19: 20.6s from [facility], one hand on walker and one on WC, 80% upright posture 7/1: 22 seconds one hand on walker one hand on w/c  9/14: 48 seconds with full upright posture 11/24: 25 seconds with full upright posture, one hand on 2WW 1/3: 18 seocnds with one near LOB    Time 12    Period Weeks    Status Partially Met    Target Date 07/03/20      PT LONG TERM GOAL #3   Title Patient will negotiate a bathroom with a walker and Min A for increased indep in home and external environment    Baseline 9/13: able to some in standing, requires sitting down to wash hands 7/14: unable to do  11/24: able to complete voiding in standing and requires sitting down to wash hands 1/3: able to complete toileting in standing but needs to sit to wash hands.    Time 12    Period Weeks    Status Partially Met    Target Date 07/03/20      PT LONG TERM GOAL #4   Title Patient will increase BLE gross strength to 4+/5 as to improve functional strength for independent gait, increased standing tolerance and increased ADL ability.    Baseline  4/21: see note 06/02/19: hip flexion 4-/5, extension/abd 3/5 12/30: hip flexion 4/5 hip extension and abduction 3/5 02/11/19: hip flexion 4/5 hip extension and abduction 2+/54-/5 RLE, 10/15: 4/5 RLE 11/7: 4/5 RLE 3/3: 4/5 7/1:  hip flexion 4/5 hip extension 2/5 hip abduction/adduciton 2/5     Time 12    Period Weeks    Status Partially Met    Target Date 07/03/20      PT LONG TERM GOAL #5   Title Patient will ambulate 250 ft with least assistive device and Supervision with no breaks to allow for increased mobility within home.    Baseline 1/3: 200 ft no breaks    Time 12    Period Weeks    Status New    Target Date  07/03/20  PT LONG TERM GOAL #7   Title Patient will perform 10 MWT in <30 seconds for improved gait speed, gait mechanics, and functional capacity for mobility.     Baseline 10/11: 48s with BRW and WC follow; 9/13: terminated due to wardrobe malfunction 9/8: 42 seconds with R foot collapse 7/14: 37.98 seconds 6/21: 46.9 seconds with RW 07/28/19: 51 seconds with RW 06/02/19: 62.5 seconds with RW 11/4: 56 seconds with AFO and RW 12/30: 53 seconds with AFO and RW  04/10/20: 42 seconds with AFO and RW    Time 12    Period Weeks    Status Partially Met    Target Date 07/03/20      PT LONG TERM GOAL #8   Title Patient will increase Berg Balance score by > 6 points (38/56) to demonstrate decreased fall risk during functional activities.    Baseline 9/13: 31/56 7/14: 33/56 07/28/19: 32/56 6/21: 32/56 04/10/20: 33/56    Time 12    Period Weeks    Status Partially Met    Target Date 07/03/20                 Plan - 05/15/20 1358    Clinical Impression Statement Pt able to ambuate for 147 total feet today with WC follow and CGA, with only one brief rest break (77 ft, 70 ft). Pt ambulates with decreased gait speed and requires frequent cues for upright posture and proxmity to RW. Due to decreased gait speed, ambulation took majority of session. Remainder of session focused on building LE power for transfers with STS. Pt with reports of dificulty with second and third sets, performing reps slowly. Frequent cuing for upright posture/hip extension and eccentric control with sitting. Last part of appointment limited due to pt prolonged restroom break. Pt will benefit from further skilled therapy to increase LE strength, mobility and functional capacity to improve QOL.    Rehab Potential Poor    Clinical Impairments Affecting Rehab Potential hx of HTN, HLD, CVA, learning disability, Diabetes, brain tumor,     PT Frequency 2x / week    PT Duration 12 weeks    PT Treatment/Interventions ADLs/Self Care Home  Management;Aquatic Therapy;Ultrasound;Moist Heat;Traction;DME Instruction;Gait training;Stair training;Functional mobility training;Therapeutic activities;Therapeutic exercise;Orthotic Fit/Training;Neuromuscular re-education;Balance training;Patient/family education;Manual techniques;Wheelchair mobility training;Passive range of motion;Energy conservation;Taping;Visual/perceptual remediation/compensation    PT Next Visit Plan progress standing balance exercises and transfer training; gait speed interventions    PT Home Exercise Plan TrA contraction, glute sets: needs updates. Potentially some time in supine flat in hospital bed, to improve hip extension deficits. Standing hip extension exercise    Consulted and Agree with Plan of Care Patient           Patient will benefit from skilled therapeutic intervention in order to improve the following deficits and impairments:  Abnormal gait,Decreased activity tolerance,Decreased balance,Decreased knowledge of precautions,Decreased endurance,Decreased coordination,Decreased knowledge of use of DME,Decreased mobility,Decreased range of motion,Difficulty walking,Decreased safety awareness,Decreased strength,Impaired flexibility,Impaired perceived functional ability,Impaired tone,Postural dysfunction,Improper body mechanics,Pain  Visit Diagnosis: Muscle weakness (generalized)  Other abnormalities of gait and mobility  Other lack of coordination     Problem List Patient Active Problem List   Diagnosis Date Noted  . Impaired glucose tolerance 03/23/2020  . Learning disability 03/23/2020  . Urinary and fecal incontinence 06/14/2019  . Chronic pain syndrome 03/23/2019  . Constipation 03/23/2019  . Dysarthria 03/23/2019  . Dysphagia 03/23/2019  . Learning difficulty 03/23/2019  . Morbid obesity ([place]) 03/23/2019  . Muscle weakness 03/23/2019  . Vitamin  D deficiency 03/23/2019  . Bilateral carotid artery stenosis 12/15/2018  . Moderate aortic valve  stenosis 12/15/2018  . BMI 45.0-49.9, adult ([place]) 11/10/2018  . Cerebrovascular accident ([place]) 05/02/2016  . Hemiparesis affecting right side as late effect of cerebrovascular accident ([place]) 05/01/2016  . Brain tumor ([place]) 11/24/2014  . Gastro-esophageal reflux disease without esophagitis 11/24/2014  . Accident due to mechanical fall without injury 08/03/2014  . Essential hypertension 06/13/2014  . Mixed hyperlipidemia 06/13/2014  . Unspecified sequelae of cerebral infarction 06/13/2014  . 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/2014  . Type 2 diabetes mellitus without complication ([place]) 28/20/6015  . Neck mass 09/08/2013  . Swimmer's ear 09/08/2013  . Erectile dysfunction 01/03/2013  . Prediabetes 01/03/2013   Ricard Dillon PT, DPT  05/15/2020, 2:28 PM  [place] MAIN [facility] SERVICES [address] [place], [place], [address] Phone: [phone]   Fax:  [phone]  Name: Curtis Powell MRN: [id number] Date of Birth: 1965-12-31

## 2020-05-17 ENCOUNTER — Ambulatory Visit: Payer: Medicare Other

## 2020-05-17 ENCOUNTER — Other Ambulatory Visit: Payer: Self-pay

## 2020-05-17 ENCOUNTER — Ambulatory Visit: Payer: Medicare Other | Admitting: Occupational Therapy

## 2020-05-17 DIAGNOSIS — M6281 Muscle weakness (generalized): Secondary | ICD-10-CM | POA: Diagnosis not present

## 2020-05-17 DIAGNOSIS — R278 Other lack of coordination: Secondary | ICD-10-CM

## 2020-05-17 DIAGNOSIS — R2689 Other abnormalities of gait and mobility: Secondary | ICD-10-CM

## 2020-05-17 NOTE — Therapy (Signed)
[place] MAIN [facility] SERVICES [address] [place], [place], [address] Phone: [phone]   Fax:  [phone]  Physical Therapy Treatment  Patient Details  Name: Curtis Powell MRN: [id number] Date of Birth: 08/22/1965 No data recorded  Encounter Date: 05/17/2020   PT End of Session - 05/17/20 1410    Visit Number [id number]    Number of Visits 284    Date for PT Re-Evaluation 07/03/20    Authorization Type 1/10  PN 04/10/20    PT Start Time 1300    PT Stop Time 1344    PT Time Calculation (min) 44 min    Equipment Utilized During Treatment Gait belt    Activity Tolerance Patient tolerated treatment well    Behavior During Therapy WFL for tasks assessed/performed           Past Medical History:  Diagnosis Date  . GERD (gastroesophageal reflux disease)   . Hyperlipidemia   . Hypertension   . Obesity   . Stroke [facility])     Past Surgical History:  Procedure Laterality Date  . BRAIN SURGERY      There were no vitals filed for this visit.   Subjective Assessment - 05/17/20 1253    Subjective Pt reports no pain today. Pt reports doing HEP.    Pertinent History  Patient is a pleasant 55 year old male who presents to physical therapy for weakness and immobility secondary to CVA.  Had a stroke in Feb 2018. He was previously fully independent, but this stroke caused severe residual deficits, mainly on the right side as well as speech, and now he is unable to walk or perform most of his ADLs on his own. Entire right side is very weak. He still has a little difficulty with speech but his swallowing is improved to baseline. His mom had to move in with him and now is his main caregiver.     Limitations Reading    How long can you sit comfortably? 5 minutes    How long can you stand comfortably? 5 minutes    How long can you walk comfortably? 8 minutes    Patient Stated Goals Walk without walker, walk further with walker, get some strength in back      Currently in Pain? No/denies          Therapeutic Exercise:  Walking for time over 10 meters with RW and WC-follow, CGA First round: 0.16 m/s Second round: 0.2 m/s Third round: s 0.21 m/s Fourth round: 0.2 m/s  Sit<>stand 1x6, 2x5 to RW, CGA  Seated marches with 3# AW- 3x15 pt rates "medium," VC/TC for increased ROM   Seated adductor squeezes with ball - 1x15, 2x12. Pt rates exercise "difficult" has difficulty maintaining isometric contraction to not drop ball.     PT Short Term Goals - 01/24/20 1711      PT SHORT TERM GOAL #1   Title Patient will be independent in home exercise program to improve strength/mobility for better functional independence with ADLs.    Baseline hep compliant    Time 2    Period Weeks    Status Achieved      PT SHORT TERM GOAL #2   Title Patient will require min cueing for STS transfer with CGA for increased independence with mobility.     Baseline CGA    Time 2    Period Weeks    Status Achieved      PT SHORT TERM GOAL #  3   Title Patient will maintain upright posture for > 15 seconds to demonstrate strengthened postural control muscualture     Baseline 18 seconds     Time 2    Period Weeks    Status Achieved             PT Long Term Goals - 04/10/20 0001      PT LONG TERM GOAL #1   Title Patient will perform bed mobility (rolling and supine <>sit) with Mod I  to increase mobility and  perform ADLs.     Baseline 9/13: able to perform with mod I occasional Min A when very fatigued 7/14: Min A 4/21: unable to perform 6/15: improving but still min-mod A  11/24: min-modA, difficulty breathing with changing positions 1/3: able to perform without assistance    Time 12    Period Weeks    Status Achieved      PT LONG TERM GOAL #2   Title Patient (< 45 years old) will complete five times sit to stand test in < 10 seconds indicating an increased LE strength and improved balance.    Baseline 9/13: 14 seconds with one near LOB 7/14: 14.31  seconds 6/21: 16.86 seconds 4/21: 18.8 seconds 06/02/19: 19.93 seconds one hand on RW, one hand on w/c 12/30: 16 secpmds BUE support 02/11/19: 36 seconds 5/19: 20.6s from [facility], one hand on walker and one on WC, 80% upright posture 7/1: 22 seconds one hand on walker one hand on w/c  9/14: 48 seconds with full upright posture 11/24: 25 seconds with full upright posture, one hand on 2WW 1/3: 18 seocnds with one near LOB    Time 12    Period Weeks    Status Partially Met    Target Date 07/03/20      PT LONG TERM GOAL #3   Title Patient will negotiate a bathroom with a walker and Min A for increased indep in home and external environment    Baseline 9/13: able to some in standing, requires sitting down to wash hands 7/14: unable to do  11/24: able to complete voiding in standing and requires sitting down to wash hands 1/3: able to complete toileting in standing but needs to sit to wash hands.    Time 12    Period Weeks    Status Partially Met    Target Date 07/03/20      PT LONG TERM GOAL #4   Title Patient will increase BLE gross strength to 4+/5 as to improve functional strength for independent gait, increased standing tolerance and increased ADL ability.    Baseline  4/21: see note 06/02/19: hip flexion 4-/5, extension/abd 3/5 12/30: hip flexion 4/5 hip extension and abduction 3/5 02/11/19: hip flexion 4/5 hip extension and abduction 2+/54-/5 RLE, 10/15: 4/5 RLE 11/7: 4/5 RLE 3/3: 4/5 7/1:  hip flexion 4/5 hip extension 2/5 hip abduction/adduciton 2/5     Time 12    Period Weeks    Status Partially Met    Target Date 07/03/20      PT LONG TERM GOAL #5   Title Patient will ambulate 250 ft with least assistive device and Supervision with no breaks to allow for increased mobility within home.    Baseline 1/3: 200 ft no breaks    Time 12    Period Weeks    Status New    Target Date 07/03/20      PT LONG TERM GOAL #7   Title Patient will perform  10 MWT in <30 seconds for improved gait speed, gait  mechanics, and functional capacity for mobility.     Baseline 10/11: 48s with BRW and WC follow; 9/13: terminated due to wardrobe malfunction 9/8: 42 seconds with R foot collapse 7/14: 37.98 seconds 6/21: 46.9 seconds with RW 07/28/19: 51 seconds with RW 06/02/19: 62.5 seconds with RW 11/4: 56 seconds with AFO and RW 12/30: 53 seconds with AFO and RW  04/10/20: 42 seconds with AFO and RW    Time 12    Period Weeks    Status Partially Met    Target Date 07/03/20      PT LONG TERM GOAL #8   Title Patient will increase Berg Balance score by > 6 points (38/56) to demonstrate decreased fall risk during functional activities.    Baseline 9/13: 31/56 7/14: 33/56 07/28/19: 32/56 6/21: 32/56 04/10/20: 33/56    Time 12    Period Weeks    Status Partially Met    Target Date 07/03/20                 Plan - 05/17/20 1411    Clinical Impression Statement First part of session focused on agility/improving pt gait speed with RW over 10 meters for four rounds. Pt's best gait speed today was 0.21 m/s with RW and WC-follow. By fourth round pt fatigued and saw slight decrease in speed. Pt also had difficulty maintaining isometric contraction of adductors necessary for seated adductor squeeze exercise, requiring PT-assist. Pt will benefit from further skilled therapy to improve B LE strength, endurance, and gait mechanics/speed.    Rehab Potential Poor    Clinical Impairments Affecting Rehab Potential hx of HTN, HLD, CVA, learning disability, Diabetes, brain tumor,     PT Frequency 2x / week    PT Duration 12 weeks    PT Treatment/Interventions ADLs/Self Care Home Management;Aquatic Therapy;Ultrasound;Moist Heat;Traction;DME Instruction;Gait training;Stair training;Functional mobility training;Therapeutic activities;Therapeutic exercise;Orthotic Fit/Training;Neuromuscular re-education;Balance training;Patient/family education;Manual techniques;Wheelchair mobility training;Passive range of motion;Energy  conservation;Taping;Visual/perceptual remediation/compensation    PT Next Visit Plan progress standing balance exercises and transfer training; progress gait speed interventions    PT Home Exercise Plan TrA contraction, glute sets: needs updates. Potentially some time in supine flat in hospital bed, to improve hip extension deficits. Standing hip extension exercise    Consulted and Agree with Plan of Care Patient           Patient will benefit from skilled therapeutic intervention in order to improve the following deficits and impairments:  Abnormal gait,Decreased activity tolerance,Decreased balance,Decreased knowledge of precautions,Decreased endurance,Decreased coordination,Decreased knowledge of use of DME,Decreased mobility,Decreased range of motion,Difficulty walking,Decreased safety awareness,Decreased strength,Impaired flexibility,Impaired perceived functional ability,Impaired tone,Postural dysfunction,Improper body mechanics,Pain  Visit Diagnosis: Muscle weakness (generalized)  Other abnormalities of gait and mobility  Other lack of coordination     Problem List Patient Active Problem List   Diagnosis Date Noted  . Impaired glucose tolerance 03/23/2020  . Learning disability 03/23/2020  . Urinary and fecal incontinence 06/14/2019  . Chronic pain syndrome 03/23/2019  . Constipation 03/23/2019  . Dysarthria 03/23/2019  . Dysphagia 03/23/2019  . Learning difficulty 03/23/2019  . Morbid obesity ([place]) 03/23/2019  . Muscle weakness 03/23/2019  . Vitamin D deficiency 03/23/2019  . Bilateral carotid artery stenosis 12/15/2018  . Moderate aortic valve stenosis 12/15/2018  . BMI 45.0-49.9, adult ([place]) 11/10/2018  . Cerebrovascular accident ([place]) 05/02/2016  . Hemiparesis affecting right side as late effect of cerebrovascular accident ([place]) 05/01/2016  . Brain tumor ([place]) 11/24/2014  .  Gastro-esophageal reflux disease without esophagitis 11/24/2014  . Accident due to  mechanical fall without injury 08/03/2014  . Essential hypertension 06/13/2014  . Mixed hyperlipidemia 06/13/2014  . Unspecified sequelae of cerebral infarction 06/13/2014  . 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/2014  . Type 2 diabetes mellitus without complication ([place]) 97/87/7654  . Neck mass 09/08/2013  . Swimmer's ear 09/08/2013  . Erectile dysfunction 01/03/2013  . Prediabetes 01/03/2013   Ricard Dillon PT, DPT 05/17/2020, 2:17 PM  [place] MAIN [facility] SERVICES [address] [place], [place], [address] Phone: [phone]   Fax:  [phone]  Name: Curtis Powell MRN: [id number] Date of Birth: 1965/12/20

## 2020-05-18 ENCOUNTER — Ambulatory Visit: Payer: Medicare Other

## 2020-05-20 ENCOUNTER — Encounter: Payer: Self-pay | Admitting: Occupational Therapy

## 2020-05-20 NOTE — Therapy (Signed)
[place] MAIN [facility] SERVICES [address] [place], [place], [address] Phone: [phone]   Fax:  [phone]  Occupational Therapy Treatment  Patient Details  Name: Curtis Powell MRN: [id number] Date of Birth: April 28, 1965 No data recorded  Encounter Date: 05/15/2020   OT End of Session - 05/20/20 1953    Visit Number [id number]    Number of Visits 258    Date for OT Re-Evaluation 06/28/20    Authorization Type Progress reporting period starting 03/27/2020    OT Start Time 1345    OT Stop Time 1430    OT Time Calculation (min) 45 min    Activity Tolerance Patient tolerated treatment well    Behavior During Therapy [facility] for tasks assessed/performed           Past Medical History:  Diagnosis Date  . GERD (gastroesophageal reflux disease)   . Hyperlipidemia   . Hypertension   . Obesity   . Stroke [facility])     Past Surgical History:  Procedure Laterality Date  . BRAIN SURGERY      There were no vitals filed for this visit.   Subjective Assessment - 05/20/20 1952    Subjective  Pt reports he would like to finish a picture he started to give to his mom.    Pertinent History Pt. is a 55 y.o. male who suffered a CVA on 05/01/2016. Pt. was admitted to the hospital. Once discharged, he received Home Health PT and OT services for about a month. Pt. has had multiple CVAs over the past 8 years, and has had multiple falls in the past 6 months. Pt. resides in an apartment. Pt. has caregivers for 80 hours. Pt.'s mother stays with pt. at night, and assists with IADL tasks.    Patient Stated Goals To be able to throw a ball and dribble a ball, do as much as I can for myself.     Currently in Pain? No/denies    Pain Score 0-No pain           Therapeutic Exercise:  Pt. seen for continued focus on pen control skills with his right hand use of markers in a holiday design. Pt. continues to be able to trace the designs, and worked on accuracy with filling  in both thin, and wide spaces with moderate deviation over the design pattern.   Response to tx: Pt. slow to complete task and continues to require increased time to work, he was able to complete and took picture to give to his mom. Pt.continuesto attend to thefulltask, and maintain a mature grasp on the pen for the entire duration of the task using a variety of markers and of different sizes. Pt.presented withlessdeviation from the lines on the design with this picture and started a new design with wider lined borders. Occasional cues to adjust pen grip and take rest breaks as needed to stretch fingers.                      OT Education - 05/20/20 1952    Education provided Yes    Education Details tripod grasping, handwriting, use of markers    Person(s) Educated Patient    Methods Explanation;Demonstration    Comprehension Verbalized understanding;Returned demonstration;Verbal cues required            OT Short Term Goals - 03/03/17 1459      OT SHORT TERM GOAL #1   Title `  OT Long Term Goals - 04/05/20 1400      OT LONG TERM GOAL #1   Title Pt. will UE functioning to be able to perfrom toilet hygiene with minimal assistance.    Baseline 01/14/2018 - Pt completes toileting hygiene independently. Pt requires assistance managing LB clothing.    Time 12    Period Weeks    Status Achieved      OT LONG TERM GOAL #2   Title Pt. will complete self grooming with minA    Baseline 02/24/2018 - Pt is independent with oral care and washing his face and occasionally requires increased time if he is having a bad day. Pt's shaving is completed by his barber.    Time 12    Period Weeks    Status Achieved      OT LONG TERM GOAL #3   Title Pt. will perform self-feeding skills with modified indepndence    Baseline 01/14/2018 - Pt is able to complete self-feeding independently with minimal spillage. Pt is unable to cut meat at this time.    Time 12     Period Weeks    Status Achieved      OT LONG TERM GOAL #4   Title Pt. will perform self dressing with minA and A/E as needed.    Baseline Pt. requires [place] LE dressing. Pt. has difficulty with LE the right LE secondary to AFO. Pt. continues to be able to donn a T shirt independently with increased time. MinA donning a jacket, Mod A donning pants over his feet, and socks, minA with shoes.    Time 12    Period Weeks    Status On-going    Target Date 06/27/20      OT LONG TERM GOAL #5   Title Pt. will perform light home making tasks with minA    Baseline Pt is able to wash dishes while seated in w/c with increased accessibility of new apartment. Pt. is able to fold his clothes, his moms puts them in the and out of the washer, and dryer, and puts them onto hangers. Caregivers perform vacuuming, and dusting.    Time 12    Period Weeks    Status Achieved      OT LONG TERM GOAL #6   Title Pt. will write his name efficiently with 100% legibility    Baseline Pt. continues to present with 755 legibility with printed form, and 50% legibility with cursive form. Pt. continues to present with 75% legibility with increased time required when printing his name, and 50% legibility when cursive writing his name to sign in when riding the [place]    Time 12    Period Weeks    Status On-going    Target Date 06/27/20      OT LONG TERM GOAL #7   Title Pt will independently and consistently follow HEP to increase UE strength to increase functional independence    Baseline Pt. continues to work towards independence with exercises    Time 12    Period Weeks    Status On-going    Target Date 06/27/20      OT LONG TERM GOAL #8   Title Pt. will require supervision ironing a shirt.    Baseline Pt. has progressed CGA, and complete set-up seated using a tabletop iron    Time 12    Period Weeks    Status Partially Met    Target Date 06/27/20      OT LONG TERM  GOAL  #9   Baseline Pt. will independently be able  to sew a button onto a shirt.    Time 12    Period Weeks    Status On-going    Target Date 06/27/20      OT LONG TERM GOAL  #10   TITLE Pt. will independently be able to throw a ball    Baseline Pt. continues to be limited with throwing overhand, however pt. is able to perform underhand throwing.    Time 12    Period Weeks    Status On-going    Target Date 06/27/20      OT LONG TERM GOAL  #11   TITLE Pt. will improve UE functional reaching to be able to independently use his RUE to hand his clothes in the closest.    Baseline Pt. uses a reacher to complete. Pt. continues to improve with reaching up with the RUE, however has difficulty reaching the hanger rack in the closet. Pt. now uses a reacher.    Time 12    Period Weeks    Status On-going    Target Date 06/27/20      OT LONG TERM GOAL  #12   TITLE Pt. will  be independent with shaving using an electric razer.    Baseline Pt. is able to use the electric razer independently, however is unable to access the position of the plug and requires assist form caregivers secondary to the size of the bathroom, and location of the plug.    Time 12    Period Weeks    Status Achieved      OT LONG TERM GOAL  #13   TITLE Pt. will increase BUE strength to be able to prepare for assisting with yardwork.    Baseline Pt. BUE strength conitnues to be 4+/5 Pt. reports that he has been able to get out to the front.    Time 12    Period Weeks    Status On-going      OT LONG TERM GOAL  #14   TITLE Pt. will improve activity toelrance in standing to be able to complete standing ADLs, and IADLs with SBA.    Baseline Pt. is limited with ADL performance in standing.    Time 12    Period Weeks    Status On-going    Target Date 06/27/20                 Plan - 05/20/20 1954    Clinical Impression Statement Pt. slow to complete task and continues to require increased time to work, he was able to complete and took picture to give to his mom.  Pt. continues to attend to the full task, and maintain a mature grasp on the pen for the entire duration of the task using a variety of markers and of different sizes. Pt. presented with less deviation from the lines on the design with this picture and started a new design with wider lined borders. Occasional cues to adjust pen grip and take rest breaks as needed to stretch fingers.    OT Occupational Profile and History Problem Focused Assessment - Including review of records relating to presenting problem    Occupational performance deficits (Please refer to evaluation for details): ADL's;IADL's    Body Structure / Function / Physical Skills ADL;FMC;ROM;IADL;Pain;Coordination    Rehab Potential Fair    Clinical Decision Making Several treatment options, min-mod task modification necessary    Modification or Assistance to  Complete Evaluation  Min-Moderate modification of tasks or assist with assess necessary to complete eval    OT Frequency 1x / week    OT Duration 12 weeks    OT Treatment/Interventions Self-care/ADL training;Patient/family education;DME and/or AE instruction;Therapeutic exercise;Neuromuscular education    Consulted and Agree with Plan of Care Patient           Patient will benefit from skilled therapeutic intervention in order to improve the following deficits and impairments:   Body Structure / Function / Physical Skills: ADL,FMC,ROM,IADL,Pain,Coordination       Visit Diagnosis: Muscle weakness (generalized)  Other lack of coordination  Hemiplegia and hemiparesis following cerebral infarction affecting right dominant side (HCC)  Left-sided weakness    Problem List Patient Active Problem List   Diagnosis Date Noted  . Impaired glucose tolerance 03/23/2020  . Learning disability 03/23/2020  . Urinary and fecal incontinence 06/14/2019  . Chronic pain syndrome 03/23/2019  . Constipation 03/23/2019  . Dysarthria 03/23/2019  . Dysphagia 03/23/2019  . Learning  difficulty 03/23/2019  . Morbid obesity ([place]) 03/23/2019  . Muscle weakness 03/23/2019  . Vitamin D deficiency 03/23/2019  . Bilateral carotid artery stenosis 12/15/2018  . Moderate aortic valve stenosis 12/15/2018  . BMI 45.0-49.9, adult ([place]) 11/10/2018  . Cerebrovascular accident ([place]) 05/02/2016  . Hemiparesis affecting right side as late effect of cerebrovascular accident ([place]) 05/01/2016  . Brain tumor ([place]) 11/24/2014  . Gastro-esophageal reflux disease without esophagitis 11/24/2014  . Accident due to mechanical fall without injury 08/03/2014  . Essential hypertension 06/13/2014  . Mixed hyperlipidemia 06/13/2014  . Unspecified sequelae of cerebral infarction 06/13/2014  . 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/2014  . Type 2 diabetes mellitus without complication ([place]) 97/47/1855  . Neck mass 09/08/2013  . Swimmer's ear 09/08/2013  . Erectile dysfunction 01/03/2013  . Prediabetes 01/03/2013   Iretta Mangrum T [name], OTR/L, CLT  Terriana Barreras 05/20/2020, 8:00 PM  [place] MAIN [facility] SERVICES [address] [place], [place], [address] Phone: [phone]   Fax:  [phone]  Name: Curtis Powell MRN: [id number] Date of Birth: 02-25-1966

## 2020-05-22 ENCOUNTER — Ambulatory Visit: Payer: Medicare Other

## 2020-05-22 ENCOUNTER — Ambulatory Visit: Payer: Medicare Other | Admitting: Occupational Therapy

## 2020-05-22 ENCOUNTER — Encounter: Payer: Self-pay | Admitting: Occupational Therapy

## 2020-05-22 ENCOUNTER — Other Ambulatory Visit: Payer: Self-pay

## 2020-05-22 DIAGNOSIS — M6281 Muscle weakness (generalized): Secondary | ICD-10-CM | POA: Diagnosis not present

## 2020-05-22 DIAGNOSIS — R278 Other lack of coordination: Secondary | ICD-10-CM

## 2020-05-22 DIAGNOSIS — R2689 Other abnormalities of gait and mobility: Secondary | ICD-10-CM

## 2020-05-22 NOTE — Therapy (Signed)
[place] MAIN [facility] SERVICES [address] [place], [place], [address] Phone: [phone]   Fax:  [phone]  Occupational Therapy Treatment  Patient Details  Name: Curtis Powell MRN: [id number] Date of Birth: 02/16/1966 No data recorded  Encounter Date: 05/22/2020   OT End of Session - 05/22/20 1440    Visit Number [id number]    Number of Visits 258    Date for OT Re-Evaluation 06/28/20    Authorization Type Progress reporting period starting 03/27/2020    OT Start Time 1432    OT Stop Time 1515    OT Time Calculation (min) 43 min    Activity Tolerance Patient tolerated treatment well    Behavior During Therapy [facility] for tasks assessed/performed           Past Medical History:  Diagnosis Date  . GERD (gastroesophageal reflux disease)   . Hyperlipidemia   . Hypertension   . Obesity   . Stroke [facility])     Past Surgical History:  Procedure Laterality Date  . BRAIN SURGERY      There were no vitals filed for this visit.   Subjective Assessment - 05/22/20 1439    Subjective  Pt. reports that he watched the 1/2 time show of the superbowl yesterday.    Patient is accompanied by: Family member    Pertinent History Pt. is a 55 y.o. male who suffered a CVA on 05/01/2016. Pt. was admitted to the hospital. Once discharged, he received Home Health PT and OT services for about a month. Pt. has had multiple CVAs over the past 8 years, and has had multiple falls in the past 6 months. Pt. resides in an apartment. Pt. has caregivers for 80 hours. Pt.'s mother stays with pt. at night, and assists with IADL tasks.    Currently in Pain? No/denies           OT TREATMENT   Therapeutic Exercise:  Pt. worked on grasp on a wide width marker. Pt. Worked on controlling movements with his right hand coloring in a Valentines holiday design. Pt. continues to be able to trace the designs, and worked on accuracy with filling in both thin, and wide spaces with  moderate deviation over the design pattern.  Pt.reports that he was not able to get out at all because of the snow. Pt. Continues to require increased time to work on the task. Pt.continuesto attend to thefulltask, and maintain a mature grasp on the large width marker for the entire duration of the task. Pt.presented withsignificantly lessdeviation from the lines on the design. Pt.continues to work on improving motor control, and [facility] skills in order to improve, and maximize independence with ADLs, and IADLs.                     OT Education - 05/22/20 1440    Education provided Yes    Education Details tripod grasping, handwriting, use of markers    Person(s) Educated Patient    Methods Explanation;Demonstration    Comprehension Verbalized understanding;Returned demonstration;Verbal cues required            OT Short Term Goals - 03/03/17 1459      OT SHORT TERM GOAL #1   Title `             OT Long Term Goals - 04/05/20 1400      OT LONG TERM GOAL #1   Title Pt. will UE functioning to be able to  perfrom toilet hygiene with minimal assistance.    Baseline 01/14/2018 - Pt completes toileting hygiene independently. Pt requires assistance managing LB clothing.    Time 12    Period Weeks    Status Achieved      OT LONG TERM GOAL #2   Title Pt. will complete self grooming with minA    Baseline 02/24/2018 - Pt is independent with oral care and washing his face and occasionally requires increased time if he is having a bad day. Pt's shaving is completed by his barber.    Time 12    Period Weeks    Status Achieved      OT LONG TERM GOAL #3   Title Pt. will perform self-feeding skills with modified indepndence    Baseline 01/14/2018 - Pt is able to complete self-feeding independently with minimal spillage. Pt is unable to cut meat at this time.    Time 12    Period Weeks    Status Achieved      OT LONG TERM GOAL #4   Title Pt. will perform self  dressing with minA and A/E as needed.    Baseline Pt. requires [place] LE dressing. Pt. has difficulty with LE the right LE secondary to AFO. Pt. continues to be able to donn a T shirt independently with increased time. MinA donning a jacket, Mod A donning pants over his feet, and socks, minA with shoes.    Time 12    Period Weeks    Status On-going    Target Date 06/27/20      OT LONG TERM GOAL #5   Title Pt. will perform light home making tasks with minA    Baseline Pt is able to wash dishes while seated in w/c with increased accessibility of new apartment. Pt. is able to fold his clothes, his moms puts them in the and out of the washer, and dryer, and puts them onto hangers. Caregivers perform vacuuming, and dusting.    Time 12    Period Weeks    Status Achieved      OT LONG TERM GOAL #6   Title Pt. will write his name efficiently with 100% legibility    Baseline Pt. continues to present with 755 legibility with printed form, and 50% legibility with cursive form. Pt. continues to present with 75% legibility with increased time required when printing his name, and 50% legibility when cursive writing his name to sign in when riding the [place]    Time 12    Period Weeks    Status On-going    Target Date 06/27/20      OT LONG TERM GOAL #7   Title Pt will independently and consistently follow HEP to increase UE strength to increase functional independence    Baseline Pt. continues to work towards independence with exercises    Time 12    Period Weeks    Status On-going    Target Date 06/27/20      OT LONG TERM GOAL #8   Title Pt. will require supervision ironing a shirt.    Baseline Pt. has progressed CGA, and complete set-up seated using a tabletop iron    Time 12    Period Weeks    Status Partially Met    Target Date 06/27/20      OT LONG TERM GOAL  #9   Baseline Pt. will independently be able to sew a button onto a shirt.    Time 12    Period Weeks  Status On-going    Target  Date 06/27/20      OT LONG TERM GOAL  #10   TITLE Pt. will independently be able to throw a ball    Baseline Pt. continues to be limited with throwing overhand, however pt. is able to perform underhand throwing.    Time 12    Period Weeks    Status On-going    Target Date 06/27/20      OT LONG TERM GOAL  #11   TITLE Pt. will improve UE functional reaching to be able to independently use his RUE to hand his clothes in the closest.    Baseline Pt. uses a reacher to complete. Pt. continues to improve with reaching up with the RUE, however has difficulty reaching the hanger rack in the closet. Pt. now uses a reacher.    Time 12    Period Weeks    Status On-going    Target Date 06/27/20      OT LONG TERM GOAL  #12   TITLE Pt. will  be independent with shaving using an electric razer.    Baseline Pt. is able to use the electric razer independently, however is unable to access the position of the plug and requires assist form caregivers secondary to the size of the bathroom, and location of the plug.    Time 12    Period Weeks    Status Achieved      OT LONG TERM GOAL  #13   TITLE Pt. will increase BUE strength to be able to prepare for assisting with yardwork.    Baseline Pt. BUE strength conitnues to be 4+/5 Pt. reports that he has been able to get out to the front.    Time 12    Period Weeks    Status On-going      OT LONG TERM GOAL  #14   TITLE Pt. will improve activity toelrance in standing to be able to complete standing ADLs, and IADLs with SBA.    Baseline Pt. is limited with ADL performance in standing.    Time 12    Period Weeks    Status On-going    Target Date 06/27/20                 Plan - 05/22/20 1440    Clinical Impression Statement Pt.reports that he was not able to get out at all because of the snow. Pt. Continues to require increased time to work on the task. Pt.continuesto attend to thefulltask, and maintain a mature grasp on the large width marker  for the entire duration of the task. Pt.presented withsignificantly lessdeviation from the lines on the design. Pt.continues to work on improving motor control, and [facility] skills in order to improve, and maximize independence with ADLs, and IADLs.   OT Occupational Profile and History Problem Focused Assessment - Including review of records relating to presenting problem    Occupational performance deficits (Please refer to evaluation for details): ADL's;IADL's    Body Structure / Function / Physical Skills ADL;FMC;ROM;IADL;Pain;Coordination    Rehab Potential Fair    Clinical Decision Making Several treatment options, min-mod task modification necessary    Comorbidities Affecting Occupational Performance: May have comorbidities impacting occupational performance    Modification or Assistance to Complete Evaluation  Min-Moderate modification of tasks or assist with assess necessary to complete eval    OT Frequency 1x / week    OT Duration 12 weeks    OT Treatment/Interventions Self-care/ADL training;Patient/family education;DME  and/or AE instruction;Therapeutic exercise;Neuromuscular education    Consulted and Agree with Plan of Care Patient           Patient will benefit from skilled therapeutic intervention in order to improve the following deficits and impairments:   Body Structure / Function / Physical Skills: ADL,FMC,ROM,IADL,Pain,Coordination       Visit Diagnosis: Muscle weakness (generalized)  Other lack of coordination    Problem List Patient Active Problem List   Diagnosis Date Noted  . Impaired glucose tolerance 03/23/2020  . Learning disability 03/23/2020  . Urinary and fecal incontinence 06/14/2019  . Chronic pain syndrome 03/23/2019  . Constipation 03/23/2019  . Dysarthria 03/23/2019  . Dysphagia 03/23/2019  . Learning difficulty 03/23/2019  . Morbid obesity ([place]) 03/23/2019  . Muscle weakness 03/23/2019  . Vitamin D deficiency 03/23/2019  . Bilateral  carotid artery stenosis 12/15/2018  . Moderate aortic valve stenosis 12/15/2018  . BMI 45.0-49.9, adult ([place]) 11/10/2018  . Cerebrovascular accident ([place]) 05/02/2016  . Hemiparesis affecting right side as late effect of cerebrovascular accident ([place]) 05/01/2016  . Brain tumor ([place]) 11/24/2014  . Gastro-esophageal reflux disease without esophagitis 11/24/2014  . Accident due to mechanical fall without injury 08/03/2014  . Essential hypertension 06/13/2014  . Mixed hyperlipidemia 06/13/2014  . Unspecified sequelae of cerebral infarction 06/13/2014  . 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/2014  . Type 2 diabetes mellitus without complication ([place]) 61/90/1222  . Neck mass 09/08/2013  . Swimmer's ear 09/08/2013  . Erectile dysfunction 01/03/2013  . Prediabetes 01/03/2013    Harrel Carina, MS, OTR/L 05/22/2020, 2:42 PM  [place] MAIN [facility] SERVICES [address] [place], [place], [address] Phone: [phone]   Fax:  [phone]  Name: Curtis Powell MRN: [id number] Date of Birth: 1966/01/31

## 2020-05-22 NOTE — Therapy (Signed)
[place] MAIN [facility] SERVICES [address] [place], [place], [address] Phone: [phone]   Fax:  [phone]  Physical Therapy Treatment Physical Therapy Progress Note   Dates of reporting period  04/10/2020   to   05/22/2020   Patient Details  Name: Curtis Powell MRN: [id number] Date of Birth: 01-Jun-1965 No data recorded  Encounter Date: 05/22/2020   PT End of Session - 05/22/20 1453    Visit Number [id number]    Number of Visits 284    Date for PT Re-Evaluation 07/03/20    Authorization Type 1/10  PN 04/10/20    PT Start Time 1348    PT Stop Time 1430    PT Time Calculation (min) 42 min    Equipment Utilized During Treatment Gait belt    Activity Tolerance Patient tolerated treatment well    Behavior During Therapy [facility] for tasks assessed/performed           Past Medical History:  Diagnosis Date  . GERD (gastroesophageal reflux disease)   . Hyperlipidemia   . Hypertension   . Obesity   . Stroke [facility])     Past Surgical History:  Procedure Laterality Date  . BRAIN SURGERY      There were no vitals filed for this visit.   Subjective Assessment - 05/22/20 1452    Subjective Pt denies pain today. No changes reported.    Pertinent History  Patient is a pleasant 55 year old male who presents to physical therapy for weakness and immobility secondary to CVA.  Had a stroke in Feb 2018. He was previously fully independent, but this stroke caused severe residual deficits, mainly on the right side as well as speech, and now he is unable to walk or perform most of his ADLs on his own. Entire right side is very weak. He still has a little difficulty with speech but his swallowing is improved to baseline. His mom had to move in with him and now is his main caregiver.     Limitations Reading    How long can you sit comfortably? 5 minutes    How long can you stand comfortably? 5 minutes    How long can you walk comfortably? 8 minutes    Patient  Stated Goals Walk without walker, walk further with walker, get some strength in back     Currently in Pain? No/denies               Treatment:  Therapeutic Exercise:  MMT: hip flexion 4+/5 B;  abduction/adduction 5/5 both B; hip extension 3/5 B  10MWT - 0.2 m/s, RW, WC-follow, CGA  5xSTS-  Two attempts. First attempt 21 seconds, use of UEs to assist. Second attempt 18 seconds use of UEs to assist.    Neuromuscular Re-education: Berg: 28/56 Pt difficulty completing tasks due to fatigue.   Patient's condition has the potential to improve in response to therapy. Maximum improvement is yet to be obtained. The anticipated improvement is attainable and reasonable in a generally predictable time.   Addendum: to update goals in flowsheet.     PT Education - 05/22/20 1453    Education provided Yes    Education Details Pt educated on reassessment/goal findings.    Person(s) Educated Patient    Methods Explanation    Comprehension Verbalized understanding            PT Short Term Goals - 05/23/20 0842      PT SHORT TERM GOAL #  1   Title Patient will be independent in home exercise program to improve strength/mobility for better functional independence with ADLs.    Baseline hep compliant    Time 2    Period Weeks    Status Achieved      PT SHORT TERM GOAL #2   Title Patient will require min cueing for STS transfer with CGA for increased independence with mobility.     Baseline CGA    Time 2    Period Weeks    Status Achieved      PT SHORT TERM GOAL #3   Title Patient will maintain upright posture for > 15 seconds to demonstrate strengthened postural control muscualture     Baseline 18 seconds     Time 2    Period Weeks    Status Achieved             PT Long Term Goals - 05/23/20 0001      PT LONG TERM GOAL #1   Title Patient will perform bed mobility (rolling and supine <>sit) with Mod I  to increase mobility and  perform ADLs.    Baseline 9/13: able to  perform with mod I occasional Min A when very fatigued 7/14: Min A 4/21: unable to perform 6/15: improving but still min-mod A  11/24: min-modA, difficulty breathing with changing positions 1/3: able to perform without assistance    Time 12    Period Weeks    Status Achieved      PT LONG TERM GOAL #2   Title Patient (< 57 years old) will complete five times sit to stand test in < 10 seconds indicating an increased LE strength and improved balance.    Baseline 9/13: 14 seconds with one near LOB 7/14: 14.31 seconds 6/21: 16.86 seconds 4/21: 18.8 seconds 06/02/19: 19.93 seconds one hand on RW, one hand on w/c 12/30: 16 secpmds BUE support 02/11/19: 36 seconds 5/19: 20.6s from [facility], one hand on walker and one on WC, 80% upright posture 7/1: 22 seconds one hand on walker one hand on w/c  9/14: 48 seconds with full upright posture 11/24: 25 seconds with full upright posture, one hand on 2WW 1/3: 18 seocnds with one near LOB; 05/22/2020 18 seconds use of UEs to assist    Time 12    Period Weeks    Status Partially Met    Target Date 08/14/20      PT LONG TERM GOAL #3   Title Patient will negotiate a bathroom with a walker and Min A for increased indep in home and external environment    Baseline 9/13: able to some in standing, requires sitting down to wash hands 7/14: unable to do  11/24: able to complete voiding in standing and requires sitting down to wash hands 1/3: able to complete toileting in standing but needs to sit to wash hands; 05/22/2020 Pt still using WC to wash hands    Time 12    Period Weeks    Status Partially Met    Target Date 08/14/20      PT LONG TERM GOAL #4   Title Patient will increase BLE gross strength to 4+/5 as to improve functional strength for independent gait, increased standing tolerance and increased ADL ability.    Baseline 4/21: see note 06/02/19: hip flexion 4-/5, extension/abd 3/5 12/30: hip flexion 4/5 hip extension and abduction 3/5 02/11/19: hip flexion 4/5 hip  extension and abduction 2+/54-/5 RLE, 10/15: 4/5 RLE 11/7: 4/5 RLE 3/3:  4/5 7/1:  hip flexion 4/5 hip extension 2/5 hip abduction/adduciton 2/5; 05/22/2020 hip flexion 4+/5 B;  abduction/adduction 5/5 both B; hip extension 3/5 B    Time 12    Period Weeks    Status Partially Met    Target Date 08/14/20      PT LONG TERM GOAL #5   Title Patient will ambulate 250 ft with least assistive device and Supervision with no breaks to allow for increased mobility within home.    Baseline 1/3: 200 ft no breaks    Time 12    Period Weeks    Status On-going    Target Date 08/14/20      PT LONG TERM GOAL #6   Title Patient will ambulate 200 ft with least assistive device and Supervision with no breaks to allow for increased mobility within home.    Baseline 9/13: performed in previous session 7/14: 190 ft with RW 4/21: ambulate 150 ft 6/21: had to terminate after 90 ft due to cardiac  11/24: level surface with 2WW for 65 ft, limited due to fatigue and not feeling good today    Time 12    Period Weeks    Status Achieved    Target Date 04/11/19      PT LONG TERM GOAL #7   Title Patient will perform 10 MWT in <30 seconds for improved gait speed, gait mechanics, and functional capacity for mobility.    Baseline 10/11: 48s with BRW and WC follow; 9/13: terminated due to wardrobe malfunction 9/8: 42 seconds with R foot collapse 7/14: 37.98 seconds 6/21: 46.9 seconds with RW 07/28/19: 51 seconds with RW 06/02/19: 62.5 seconds with RW 11/4: 56 seconds with AFO and RW 12/30: 53 seconds with AFO and RW  04/10/20: 42 seconds with AFO and RW; 05/22/2020 0.2 m/s with RW, WC-follow, CGA    Time 12    Period Weeks    Status Partially Met    Target Date 08/14/20      PT LONG TERM GOAL #8   Title Patient will increase Berg Balance score by > 6 points (38/56) to demonstrate decreased fall risk during functional activities.    Baseline 9/13: 31/56 7/14: 33/56 07/28/19: 32/56 6/21: 32/56 04/10/20: 33/56; 05/22/2020 BERG 28/56,  impacted performance due to fatigue    Time 12    Period Weeks    Status Partially Met    Target Date 08/14/20                 Plan - 05/22/20 1458    Clinical Impression Statement Pt goals reassessed for progress today. Pt making gains toward MMT goal. Pt strength grossly 4/5, with improvements on hip flexion 4+/5 B, abduction/adducion 5/5 both B, and hip extension 3/5 B. Pt 5xSTS is 18 seconds today on second attempt, maintaining score from previous assessment. Pt 10MWT was 0.2 m/s also similar to previous assessment.. Pt fatigued by the time Merrilee Jansky was performed, where pt scored 28/56. This is lower than previous assessment, however pt fatigue affected his performance. Berg to be reassessed next session as well as pt ambulation distance. Testing today indicate pt with improved LE strength, but with continues impairments of gait speed and fall risk. Pt will benefit from further skilled therapy to improve BLE strength, balance and gait to improve QOL.    Rehab Potential Poor    Clinical Impairments Affecting Rehab Potential hx of HTN, HLD, CVA, learning disability, Diabetes, brain tumor,     PT Frequency 2x /  week    PT Duration 12 weeks    PT Treatment/Interventions ADLs/Self Care Home Management;Aquatic Therapy;Ultrasound;Moist Heat;Traction;DME Instruction;Gait training;Stair training;Functional mobility training;Therapeutic activities;Therapeutic exercise;Orthotic Fit/Training;Neuromuscular re-education;Balance training;Patient/family education;Manual techniques;Wheelchair mobility training;Passive range of motion;Energy conservation;Taping;Visual/perceptual remediation/compensation    PT Next Visit Plan progress standing balance exercises and transfer training; progress gait speed interventions; reassess Berg and ambulation distance    PT Home Exercise Plan TrA contraction, glute sets: needs updates. Potentially some time in supine flat in hospital bed, to improve hip extension deficits.  Standing hip extension exercise    Consulted and Agree with Plan of Care Patient           Patient will benefit from skilled therapeutic intervention in order to improve the following deficits and impairments:  Abnormal gait,Decreased activity tolerance,Decreased balance,Decreased knowledge of precautions,Decreased endurance,Decreased coordination,Decreased knowledge of use of DME,Decreased mobility,Decreased range of motion,Difficulty walking,Decreased safety awareness,Decreased strength,Impaired flexibility,Impaired perceived functional ability,Impaired tone,Postural dysfunction,Improper body mechanics,Pain  Visit Diagnosis: Muscle weakness (generalized)  Other abnormalities of gait and mobility  Other lack of coordination     Problem List Patient Active Problem List   Diagnosis Date Noted  . Impaired glucose tolerance 03/23/2020  . Learning disability 03/23/2020  . Urinary and fecal incontinence 06/14/2019  . Chronic pain syndrome 03/23/2019  . Constipation 03/23/2019  . Dysarthria 03/23/2019  . Dysphagia 03/23/2019  . Learning difficulty 03/23/2019  . Morbid obesity ([place]) 03/23/2019  . Muscle weakness 03/23/2019  . Vitamin D deficiency 03/23/2019  . Bilateral carotid artery stenosis 12/15/2018  . Moderate aortic valve stenosis 12/15/2018  . BMI 45.0-49.9, adult ([place]) 11/10/2018  . Cerebrovascular accident ([place]) 05/02/2016  . Hemiparesis affecting right side as late effect of cerebrovascular accident ([place]) 05/01/2016  . Brain tumor ([place]) 11/24/2014  . Gastro-esophageal reflux disease without esophagitis 11/24/2014  . Accident due to mechanical fall without injury 08/03/2014  . Essential hypertension 06/13/2014  . Mixed hyperlipidemia 06/13/2014  . Unspecified sequelae of cerebral infarction 06/13/2014  . 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/2014  . Type 2 diabetes mellitus without complication ([place]) 35/67/0141  . Neck mass 09/08/2013  . Swimmer's ear 09/08/2013  . Erectile dysfunction  01/03/2013  . Prediabetes 01/03/2013   Ricard Dillon PT, DPT  Zollie Pee 05/23/2020, 8:52 AM  [place] MAIN [facility] SERVICES [address] [place], [place], [address] Phone: [phone]   Fax:  [phone]  Name: CLAUDIO MONDRY MRN: [id number] Date of Birth: Aug 01, 1965

## 2020-05-24 ENCOUNTER — Other Ambulatory Visit: Payer: Self-pay

## 2020-05-24 ENCOUNTER — Encounter: Payer: Medicare HMO | Admitting: Occupational Therapy

## 2020-05-24 ENCOUNTER — Ambulatory Visit: Payer: Medicare Other

## 2020-05-24 DIAGNOSIS — M6281 Muscle weakness (generalized): Secondary | ICD-10-CM | POA: Diagnosis not present

## 2020-05-24 DIAGNOSIS — R2689 Other abnormalities of gait and mobility: Secondary | ICD-10-CM

## 2020-05-24 DIAGNOSIS — R278 Other lack of coordination: Secondary | ICD-10-CM

## 2020-05-24 NOTE — Therapy (Signed)
[place] MAIN [facility] SERVICES [address] [place], [place], [address] Phone: [phone]   Fax:  [phone]  Physical Therapy Treatment  Patient Details  Name: Curtis Powell MRN: [id number] Date of Birth: Dec 04, 1965 No data recorded  Encounter Date: 05/24/2020   PT End of Session - 05/24/20 1410    Visit Number [id number]    Number of Visits 284    Date for PT Re-Evaluation 07/03/20    Authorization Type 1/10  PN 04/10/20    PT Start Time 1302    PT Stop Time 1346    PT Time Calculation (min) 44 min    Equipment Utilized During Treatment Gait belt    Activity Tolerance Patient tolerated treatment well    Behavior During Therapy WFL for tasks assessed/performed           Past Medical History:  Diagnosis Date  . GERD (gastroesophageal reflux disease)   . Hyperlipidemia   . Hypertension   . Obesity   . Stroke [facility])     Past Surgical History:  Procedure Laterality Date  . BRAIN SURGERY      There were no vitals filed for this visit.   Subjective Assessment - 05/24/20 1302    Subjective Pt reports R foot swelling Monday after he got home. Pt states "both legs have been swelling."    Pertinent History  Patient is a pleasant 55 year old male who presents to physical therapy for weakness and immobility secondary to CVA.  Had a stroke in Feb 2018. He was previously fully independent, but this stroke caused severe residual deficits, mainly on the right side as well as speech, and now he is unable to walk or perform most of his ADLs on his own. Entire right side is very weak. He still has a little difficulty with speech but his swallowing is improved to baseline. His mom had to move in with him and now is his main caregiver.     Limitations Reading    How long can you sit comfortably? 5 minutes    How long can you stand comfortably? 5 minutes    How long can you walk comfortably? 8 minutes    Patient Stated Goals Walk without walker, walk  further with walker, get some strength in back            Treatment:  Palpation/assessment of B LE and foot/ankle. Temperature normal to touch both LEs, no redness noted. No swelling observed. No pain reported with palpation.  Therapeutic Exercise:  Seated marches with RTB- 3x15; TC/VC for increased ROM  Seated adductor squeezes with ball - 3x15; Pt rates exercise "medium",   RTB seated hip abduction with TC/VC/demonstration to RLE - 3x15  Seated RTB hamstring curls - 3x12 BLEs; Pt rates exercise "medium." Demonstration provided for technique.   Dorsiflexion with RTB 2x20. Pt rates exercise "medium."  LAQ with RTB 1x15 pt rates "kind of hard"    PT Education - 05/24/20 1409    Education provided Yes    Education Details Pt educated on technique with RTB hip abduction.    Person(s) Educated Patient    Methods Explanation;Tactile cues;Demonstration    Comprehension Verbalized understanding;Returned demonstration            PT Short Term Goals - 05/23/20 1700      PT SHORT TERM GOAL #1   Title Patient will be independent in home exercise program to improve strength/mobility for better functional independence with ADLs.  Baseline hep compliant    Time 2    Period Weeks    Status Achieved      PT SHORT TERM GOAL #2   Title Patient will require min cueing for STS transfer with CGA for increased independence with mobility.     Baseline CGA    Time 2    Period Weeks    Status Achieved      PT SHORT TERM GOAL #3   Title Patient will maintain upright posture for > 15 seconds to demonstrate strengthened postural control muscualture     Baseline 18 seconds     Time 2    Period Weeks    Status Achieved             PT Long Term Goals - 05/23/20 0001      PT LONG TERM GOAL #1   Title Patient will perform bed mobility (rolling and supine <>sit) with Mod I  to increase mobility and  perform ADLs.    Baseline 9/13: able to perform with mod I occasional Min A when  very fatigued 7/14: Min A 4/21: unable to perform 6/15: improving but still min-mod A  11/24: min-modA, difficulty breathing with changing positions 1/3: able to perform without assistance    Time 12    Period Weeks    Status Achieved      PT LONG TERM GOAL #2   Title Patient (< 16 years old) will complete five times sit to stand test in < 10 seconds indicating an increased LE strength and improved balance.    Baseline 9/13: 14 seconds with one near LOB 7/14: 14.31 seconds 6/21: 16.86 seconds 4/21: 18.8 seconds 06/02/19: 19.93 seconds one hand on RW, one hand on w/c 12/30: 16 secpmds BUE support 02/11/19: 36 seconds 5/19: 20.6s from [facility], one hand on walker and one on WC, 80% upright posture 7/1: 22 seconds one hand on walker one hand on w/c  9/14: 48 seconds with full upright posture 11/24: 25 seconds with full upright posture, one hand on 2WW 1/3: 18 seocnds with one near LOB; 05/22/2020 18 seconds use of UEs to assist    Time 12    Period Weeks    Status Partially Met    Target Date 08/14/20      PT LONG TERM GOAL #3   Title Patient will negotiate a bathroom with a walker and Min A for increased indep in home and external environment    Baseline 9/13: able to some in standing, requires sitting down to wash hands 7/14: unable to do  11/24: able to complete voiding in standing and requires sitting down to wash hands 1/3: able to complete toileting in standing but needs to sit to wash hands; 05/22/2020 Pt still using WC to wash hands    Time 12    Period Weeks    Status Partially Met    Target Date 08/14/20      PT LONG TERM GOAL #4   Title Patient will increase BLE gross strength to 4+/5 as to improve functional strength for independent gait, increased standing tolerance and increased ADL ability.    Baseline 4/21: see note 06/02/19: hip flexion 4-/5, extension/abd 3/5 12/30: hip flexion 4/5 hip extension and abduction 3/5 02/11/19: hip flexion 4/5 hip extension and abduction 2+/54-/5 RLE, 10/15: 4/5  RLE 11/7: 4/5 RLE 3/3: 4/5 7/1:  hip flexion 4/5 hip extension 2/5 hip abduction/adduciton 2/5; 05/22/2020 hip flexion 4+/5 B;  abduction/adduction 5/5 both B; hip extension  3/5 B    Time 12    Period Weeks    Status Partially Met    Target Date 08/14/20      PT LONG TERM GOAL #5   Title Patient will ambulate 250 ft with least assistive device and Supervision with no breaks to allow for increased mobility within home.    Baseline 1/3: 200 ft no breaks    Time 12    Period Weeks    Status On-going    Target Date 08/14/20      PT LONG TERM GOAL #6   Title Patient will ambulate 200 ft with least assistive device and Supervision with no breaks to allow for increased mobility within home.    Baseline 9/13: performed in previous session 7/14: 190 ft with RW 4/21: ambulate 150 ft 6/21: had to terminate after 90 ft due to cardiac  11/24: level surface with 2WW for 65 ft, limited due to fatigue and not feeling good today    Time 12    Period Weeks    Status Achieved    Target Date 04/11/19      PT LONG TERM GOAL #7   Title Patient will perform 10 MWT in <30 seconds for improved gait speed, gait mechanics, and functional capacity for mobility.    Baseline 10/11: 48s with BRW and WC follow; 9/13: terminated due to wardrobe malfunction 9/8: 42 seconds with R foot collapse 7/14: 37.98 seconds 6/21: 46.9 seconds with RW 07/28/19: 51 seconds with RW 06/02/19: 62.5 seconds with RW 11/4: 56 seconds with AFO and RW 12/30: 53 seconds with AFO and RW  04/10/20: 42 seconds with AFO and RW; 05/22/2020 0.2 m/s with RW, WC-follow, CGA    Time 12    Period Weeks    Status Partially Met    Target Date 08/14/20      PT LONG TERM GOAL #8   Title Patient will increase Berg Balance score by > 6 points (38/56) to demonstrate decreased fall risk during functional activities.    Baseline 9/13: 31/56 7/14: 33/56 07/28/19: 32/56 6/21: 32/56 04/10/20: 33/56; 05/22/2020 BERG 28/56, impacted performance due to fatigue    Time 12     Period Weeks    Status Partially Met    Target Date 08/14/20                 Plan - 05/24/20 1410    Clinical Impression Statement Beginning of session pt LEs examined for swelling due to pt reports of swelling. Pt did not have any pain to B LE/foot/ankle with palpation, temperature normal to touch and no redness or swelling noted currently. Rest of session focused on seated therex with a RTB, where pt rated majority of exercises as medium difficulty. Pt will continue to benefit from further skilled therapy to increase BLE strength, mobility, and gait in order to improve QOL.    Rehab Potential Poor    Clinical Impairments Affecting Rehab Potential hx of HTN, HLD, CVA, learning disability, Diabetes, brain tumor,     PT Frequency 2x / week    PT Duration 12 weeks    PT Treatment/Interventions ADLs/Self Care Home Management;Aquatic Therapy;Ultrasound;Moist Heat;Traction;DME Instruction;Gait training;Stair training;Functional mobility training;Therapeutic activities;Therapeutic exercise;Orthotic Fit/Training;Neuromuscular re-education;Balance training;Patient/family education;Manual techniques;Wheelchair mobility training;Passive range of motion;Energy conservation;Taping;Visual/perceptual remediation/compensation    PT Next Visit Plan progress standing balance exercises and transfer training; progress gait speed interventions; reassess Berg and ambulation distance    PT Home Exercise Plan TrA contraction, glute sets: needs updates. Potentially  some time in supine flat in hospital bed, to improve hip extension deficits. Standing hip extension exercise    Consulted and Agree with Plan of Care Patient           Patient will benefit from skilled therapeutic intervention in order to improve the following deficits and impairments:  Abnormal gait,Decreased activity tolerance,Decreased balance,Decreased knowledge of precautions,Decreased endurance,Decreased coordination,Decreased knowledge of  use of DME,Decreased mobility,Decreased range of motion,Difficulty walking,Decreased safety awareness,Decreased strength,Impaired flexibility,Impaired perceived functional ability,Impaired tone,Postural dysfunction,Improper body mechanics,Pain  Visit Diagnosis: Muscle weakness (generalized)  Other abnormalities of gait and mobility  Other lack of coordination     Problem List Patient Active Problem List   Diagnosis Date Noted  . Impaired glucose tolerance 03/23/2020  . Learning disability 03/23/2020  . Urinary and fecal incontinence 06/14/2019  . Chronic pain syndrome 03/23/2019  . Constipation 03/23/2019  . Dysarthria 03/23/2019  . Dysphagia 03/23/2019  . Learning difficulty 03/23/2019  . Morbid obesity ([place]) 03/23/2019  . Muscle weakness 03/23/2019  . Vitamin D deficiency 03/23/2019  . Bilateral carotid artery stenosis 12/15/2018  . Moderate aortic valve stenosis 12/15/2018  . BMI 45.0-49.9, adult ([place]) 11/10/2018  . Cerebrovascular accident ([place]) 05/02/2016  . Hemiparesis affecting right side as late effect of cerebrovascular accident ([place]) 05/01/2016  . Brain tumor ([place]) 11/24/2014  . Gastro-esophageal reflux disease without esophagitis 11/24/2014  . Accident due to mechanical fall without injury 08/03/2014  . Essential hypertension 06/13/2014  . Mixed hyperlipidemia 06/13/2014  . Unspecified sequelae of cerebral infarction 06/13/2014  . 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/2014  . Type 2 diabetes mellitus without complication ([place]) 72/55/0016  . Neck mass 09/08/2013  . Swimmer's ear 09/08/2013  . Erectile dysfunction 01/03/2013  . Prediabetes 01/03/2013   Ricard Dillon PT, DPT  05/24/2020, 2:14 PM  [place] MAIN [facility] SERVICES [address] [place], [place], [address] Phone: [phone]   Fax:  [phone]  Name: Curtis Powell MRN: [id number] Date of Birth: 1965/05/20

## 2020-05-29 ENCOUNTER — Ambulatory Visit: Payer: Medicare Other

## 2020-05-29 ENCOUNTER — Ambulatory Visit: Payer: Medicare Other | Admitting: Occupational Therapy

## 2020-05-29 ENCOUNTER — Encounter: Payer: Self-pay | Admitting: Occupational Therapy

## 2020-05-29 ENCOUNTER — Other Ambulatory Visit: Payer: Self-pay

## 2020-05-29 DIAGNOSIS — I69351 Hemiplegia and hemiparesis following cerebral infarction affecting right dominant side: Secondary | ICD-10-CM

## 2020-05-29 DIAGNOSIS — R2689 Other abnormalities of gait and mobility: Secondary | ICD-10-CM

## 2020-05-29 DIAGNOSIS — R278 Other lack of coordination: Secondary | ICD-10-CM

## 2020-05-29 DIAGNOSIS — M6281 Muscle weakness (generalized): Secondary | ICD-10-CM

## 2020-05-29 DIAGNOSIS — R2681 Unsteadiness on feet: Secondary | ICD-10-CM

## 2020-05-29 NOTE — Therapy (Signed)
[place] MAIN [facility] SERVICES [address] [place], [place], [address] Phone: [phone]   Fax:  [phone]  Occupational Therapy Treatment  Patient Details  Name: Curtis Powell MRN: [id number] Date of Birth: January 10, 1966 No data recorded  Encounter Date: 05/29/2020   OT End of Session - 06/02/20 1544    Visit Number 247    Number of Visits 258    Date for OT Re-Evaluation 06/28/20    Authorization Type Progress reporting period starting 03/27/2020    OT Start Time 1259    OT Stop Time 1345    OT Time Calculation (min) 46 min    Activity Tolerance Patient tolerated treatment well    Behavior During Therapy [facility] for tasks assessed/performed           Past Medical History:  Diagnosis Date  . GERD (gastroesophageal reflux disease)   . Hyperlipidemia   . Hypertension   . Obesity   . Stroke [facility])     Past Surgical History:  Procedure Laterality Date  . BRAIN SURGERY      There were no vitals filed for this visit.   Subjective Assessment - 06/02/20 1543    Subjective  Pt reports he had a good weekend, was his mom's birthday.    Pertinent History Pt. is a 55 y.o. male who suffered a CVA on 05/01/2016. Pt. was admitted to the hospital. Once discharged, he received Home Health PT and OT services for about a month. Pt. has had multiple CVAs over the past 8 years, and has had multiple falls in the past 6 months. Pt. resides in an apartment. Pt. has caregivers for 80 hours. Pt.'s mother stays with pt. at night, and assists with IADL tasks.    Patient Stated Goals To be able to throw a ball and dribble a ball, do as much as I can for myself.     Pain Score 0-No pain           Therapeutic Exercise: Patient seen for UB strengthening with use of 3# dowel for shoulder flexion, ABD/ADD,  Chest press, forwards/backwards circles, elbow flexion/ext, 10 reps for 2 sets.  Therapist demo and cues for proper form and technique.  ADLs: Donning and  doffing jacket from seated position for multiple trials and methods.  At the end, stood with min guard and then required min assist to pull down jacket at the end.     Response to tx: Patient continues to demonstrate difficulty with donning his jacket.  Min assist required to don jacket in addition to cues. Strength continues to improve with tasks.  Continue to work towards goals to increase independence in necessary daily tasks.                         OT Short Term Goals - 03/03/17 1459      OT SHORT TERM GOAL #1   Title `             OT Long Term Goals - 04/05/20 1400      OT LONG TERM GOAL #1   Title Pt. will UE functioning to be able to perfrom toilet hygiene with minimal assistance.    Baseline 01/14/2018 - Pt completes toileting hygiene independently. Pt requires assistance managing LB clothing.    Time 12    Period Weeks    Status Achieved      OT LONG TERM GOAL #2   Title Pt. will  complete self grooming with minA    Baseline 02/24/2018 - Pt is independent with oral care and washing his face and occasionally requires increased time if he is having a bad day. Pt's shaving is completed by his barber.    Time 12    Period Weeks    Status Achieved      OT LONG TERM GOAL #3   Title Pt. will perform self-feeding skills with modified indepndence    Baseline 01/14/2018 - Pt is able to complete self-feeding independently with minimal spillage. Pt is unable to cut meat at this time.    Time 12    Period Weeks    Status Achieved      OT LONG TERM GOAL #4   Title Pt. will perform self dressing with minA and A/E as needed.    Baseline Pt. requires Waterproof LE dressing. Pt. has difficulty with LE the right LE secondary to AFO. Pt. continues to be able to donn a T shirt independently with increased time. MinA donning a jacket, Mod A donning pants over his feet, and socks, minA with shoes.    Time 12    Period Weeks    Status On-going    Target Date 06/27/20       OT LONG TERM GOAL #5   Title Pt. will perform light home making tasks with minA    Baseline Pt is able to wash dishes while seated in w/c with increased accessibility of new apartment. Pt. is able to fold his clothes, his moms puts them in the and out of the washer, and dryer, and puts them onto hangers. Caregivers perform vacuuming, and dusting.    Time 12    Period Weeks    Status Achieved      OT LONG TERM GOAL #6   Title Pt. will write his name efficiently with 100% legibility    Baseline Pt. continues to present with 755 legibility with printed form, and 50% legibility with cursive form. Pt. continues to present with 75% legibility with increased time required when printing his name, and 50% legibility when cursive writing his name to sign in when riding the [place]    Time 12    Period Weeks    Status On-going    Target Date 06/27/20      OT LONG TERM GOAL #7   Title Pt will independently and consistently follow HEP to increase UE strength to increase functional independence    Baseline Pt. continues to work towards independence with exercises    Time 12    Period Weeks    Status On-going    Target Date 06/27/20      OT LONG TERM GOAL #8   Title Pt. will require supervision ironing a shirt.    Baseline Pt. has progressed CGA, and complete set-up seated using a tabletop iron    Time 12    Period Weeks    Status Partially Met    Target Date 06/27/20      OT LONG TERM GOAL  #9   Baseline Pt. will independently be able to sew a button onto a shirt.    Time 12    Period Weeks    Status On-going    Target Date 06/27/20      OT LONG TERM GOAL  #10   TITLE Pt. will independently be able to throw a ball    Baseline Pt. continues to be limited with throwing overhand, however pt. is able to perform underhand  throwing.    Time 12    Period Weeks    Status On-going    Target Date 06/27/20      OT LONG TERM GOAL  #11   TITLE Pt. will improve UE functional reaching to be able to  independently use his RUE to hand his clothes in the closest.    Baseline Pt. uses a reacher to complete. Pt. continues to improve with reaching up with the RUE, however has difficulty reaching the hanger rack in the closet. Pt. now uses a reacher.    Time 12    Period Weeks    Status On-going    Target Date 06/27/20      OT LONG TERM GOAL  #12   TITLE Pt. will  be independent with shaving using an electric razer.    Baseline Pt. is able to use the electric razer independently, however is unable to access the position of the plug and requires assist form caregivers secondary to the size of the bathroom, and location of the plug.    Time 12    Period Weeks    Status Achieved      OT LONG TERM GOAL  #13   TITLE Pt. will increase BUE strength to be able to prepare for assisting with yardwork.    Baseline Pt. BUE strength conitnues to be 4+/5 Pt. reports that he has been able to get out to the front.    Time 12    Period Weeks    Status On-going      OT LONG TERM GOAL  #14   TITLE Pt. will improve activity toelrance in standing to be able to complete standing ADLs, and IADLs with SBA.    Baseline Pt. is limited with ADL performance in standing.    Time 12    Period Weeks    Status On-going    Target Date 06/27/20                 Plan - 06/02/20 1544    Clinical Impression Statement Patient continues to demonstrate difficulty with donning his jacket.  Min assist required to don jacket in addition to cues. Strength continues to improve with tasks.  Continue to work towards goals to increase independence in necessary daily tasks.    OT Occupational Profile and History Problem Focused Assessment - Including review of records relating to presenting problem    Occupational performance deficits (Please refer to evaluation for details): ADL's;IADL's    Body Structure / Function / Physical Skills ADL;FMC;ROM;IADL;Pain;Coordination    Rehab Potential Fair    Clinical Decision Making  Several treatment options, min-mod task modification necessary    Comorbidities Affecting Occupational Performance: May have comorbidities impacting occupational performance    Modification or Assistance to Complete Evaluation  Min-Moderate modification of tasks or assist with assess necessary to complete eval    OT Frequency 1x / week    OT Duration 12 weeks    OT Treatment/Interventions Self-care/ADL training;Patient/family education;DME and/or AE instruction;Therapeutic exercise;Neuromuscular education    Consulted and Agree with Plan of Care Patient           Patient will benefit from skilled therapeutic intervention in order to improve the following deficits and impairments:   Body Structure / Function / Physical Skills: ADL,FMC,ROM,IADL,Pain,Coordination       Visit Diagnosis: Muscle weakness (generalized)  Other lack of coordination  Hemiplegia and hemiparesis following cerebral infarction affecting right dominant side [facility])    Problem List Patient Active  Problem List   Diagnosis Date Noted  . Impaired glucose tolerance 03/23/2020  . Learning disability 03/23/2020  . Urinary and fecal incontinence 06/14/2019  . Chronic pain syndrome 03/23/2019  . Constipation 03/23/2019  . Dysarthria 03/23/2019  . Dysphagia 03/23/2019  . Learning difficulty 03/23/2019  . Morbid obesity ([place]) 03/23/2019  . Muscle weakness 03/23/2019  . Vitamin D deficiency 03/23/2019  . Bilateral carotid artery stenosis 12/15/2018  . Moderate aortic valve stenosis 12/15/2018  . BMI 45.0-49.9, adult ([place]) 11/10/2018  . Cerebrovascular accident ([place]) 05/02/2016  . Hemiparesis affecting right side as late effect of cerebrovascular accident ([place]) 05/01/2016  . Brain tumor ([place]) 11/24/2014  . Gastro-esophageal reflux disease without esophagitis 11/24/2014  . Accident due to mechanical fall without injury 08/03/2014  . Essential hypertension 06/13/2014  . Mixed hyperlipidemia 06/13/2014  .  Unspecified sequelae of cerebral infarction 06/13/2014  . 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/2014  . Type 2 diabetes mellitus without complication ([place]) 20/60/1561  . Neck mass 09/08/2013  . Swimmer's ear 09/08/2013  . Erectile dysfunction 01/03/2013  . Prediabetes 01/03/2013   Christl Fessenden T [name], OTR/L, CLT  Armie Moren 06/02/2020, 3:51 PM  [place] MAIN [facility] SERVICES [address] [place], [place], [address] Phone: [phone]   Fax:  [phone]  Name: Curtis Powell MRN: [id number] Date of Birth: [birth year]

## 2020-05-29 NOTE — Therapy (Signed)
[place] [facility] SERVICES [address] [place], [place], [address] Phone: [phone]   Fax:  [phone]  Physical Therapy Treatment  Patient Details  Name: Curtis Powell MRN: [id number] Date of Birth: 06/02/1965 No data recorded  Encounter Date: 05/29/2020   PT End of Session - 05/29/20 1452    Visit Number [id number]    Number of Visits 284    Date for PT Re-Evaluation 07/03/20    Authorization Type 1/10  PN 04/10/20    PT Start Time 1346    PT Stop Time 1432    PT Time Calculation (min) 46 min    Equipment Utilized During Treatment Gait belt    Activity Tolerance Patient tolerated treatment well;Patient limited by fatigue    Behavior During Therapy [facility] for tasks assessed/performed           Past Medical History:  Diagnosis Date  . GERD (gastroesophageal reflux disease)   . Hyperlipidemia   . Hypertension   . Obesity   . Stroke [facility])     Past Surgical History:  Procedure Laterality Date  . BRAIN SURGERY      There were no vitals filed for this visit.   Subjective Assessment - 05/29/20 1347    Subjective Pt reports no pain today. Pt reports that recently at his home he tried walking with home aide and said he stood up and lost his balance and fell back into the WC. Pt reports he did not hurt himself. Pt says he still has some swelling in his LEs and that it felt like he was wearing "weights," at one point. Pt reports he has had some burning with urination recently. Pt denies HA, dizziness, nausea or any other changes/sx.    Pertinent History  Patient is a pleasant 55 year old male who presents to physical therapy for weakness and immobility secondary to CVA.  Had a stroke in Feb 2018. He was previously fully independent, but this stroke caused severe residual deficits, mainly on the right side as well as speech, and now he is unable to walk or perform most of his ADLs on his own. Entire right side is very weak. He still has a little  difficulty with speech but his swallowing is improved to baseline. His mom had to move in with him and now is his main caregiver.     Limitations Reading    How long can you sit comfortably? 5 minutes    How long can you stand comfortably? 5 minutes    How long can you walk comfortably? 8 minutes    Patient Stated Goals Walk without walker, walk further with walker, get some strength in back     Currently in Pain? No/denies              [facility] PT Assessment - 05/29/20 0001      Berg Balance Test   Sit to Stand Able to stand  independently using hands    Standing Unsupported Able to stand safely 2 minutes    Sitting with Back Unsupported but Feet Supported on Floor or Stool Able to sit safely and securely 2 minutes    Stand to Sit Controls descent by using hands    Transfers Able to transfer safely, definite need of hands    Standing Unsupported with Eyes Closed Able to stand 3 seconds    Standing Unsupported with Feet Together Able to place feet together independently and stand for 1 minute with supervision  From Standing, Reach Forward with Outstretched Arm Can reach forward >12 cm safely (5")    From Standing Position, Pick up Object from Floor Able to pick up shoe, needs supervision    From Standing Position, Turn to Look Behind Over each Shoulder Turn sideways only but maintains balance    Turn 360 Degrees Needs assistance while turning    Standing Unsupported, Alternately Place Feet on Step/Stool Needs assistance to keep from falling or unable to try    Standing Unsupported, One Foot in [place] to take small step independently and hold 30 seconds    Standing on One Leg Unable to try or needs assist to prevent fall    Total Score 32          Treatment:  Examination of B LE - Pt with swelling posterior medial/laterally on R ankle and posterior-lateral L ankle. R LE takes 1-2 sec to rebound, L LE immediate rebound. R foot TTP medial and lateral border. Pt reports swelling of  LEs with eating sweets and says he "triest not eat sweets."  Therapeutic Exercise: Pt required multiple seated rest breaks due to fatigue throughout session.   STS 1x7 VC/TC for technique. Seated marches 3x15 VC/TC Attempted ambulation with RW and WC-follow. Pt ambulated approx 4 feet before LOB posteriorly requiring up to mod assist from PT to safely lower into WC to sit. Pt additionally reports fatigue.   Neuromuscular Re-Ed: Merrilee Jansky - able to pick up object when VC to weightshift from back of heels toward forefoot. Greatest difficulty with alternating toe taps, SLB, turning 360. Demonstrates increased hindfoot weight shift with LOB   Access Code: [email or web address] URL: [email or web address] Date: 05/29/2020 Prepared by: Ricard Dillon  Exercises Seated March - 1 x daily - 4 x weekly - 3 sets - 10 reps    PT Education - 05/29/20 1451    Education Details Pt educated on weight-shifting/mid-foot shifts to assist balance    Person(s) Educated Patient    Methods Explanation;Demonstration;Verbal cues;Tactile cues    Comprehension Verbalized understanding;Returned demonstration            PT Short Term Goals - 05/23/20 1191      PT SHORT TERM GOAL #1   Title Patient will be independent in home exercise program to improve strength/mobility for better functional independence with ADLs.    Baseline hep compliant    Time 2    Period Weeks    Status Achieved      PT SHORT TERM GOAL #2   Title Patient will require min cueing for STS transfer with CGA for increased independence with mobility.     Baseline CGA    Time 2    Period Weeks    Status Achieved      PT SHORT TERM GOAL #3   Title Patient will maintain upright posture for > 15 seconds to demonstrate strengthened postural control muscualture     Baseline 18 seconds     Time 2    Period Weeks    Status Achieved             PT Long Term Goals - 05/23/20 0001      PT LONG TERM GOAL #1   Title Patient will perform  bed mobility (rolling and supine <>sit) with Mod I  to increase mobility and  perform ADLs.    Baseline 9/13: able to perform with mod I occasional Min A when very fatigued 7/14: Min A 4/21: unable to perform 6/15: improving but still  min-mod A  11/24: min-modA, difficulty breathing with changing positions 1/3: able to perform without assistance    Time 12    Period Weeks    Status Achieved      PT LONG TERM GOAL #2   Title Patient (< 63 years old) will complete five times sit to stand test in < 10 seconds indicating an increased LE strength and improved balance.    Baseline 9/13: 14 seconds with one near LOB 7/14: 14.31 seconds 6/21: 16.86 seconds 4/21: 18.8 seconds 06/02/19: 19.93 seconds one hand on RW, one hand on w/c 12/30: 16 secpmds BUE support 02/11/19: 36 seconds 5/19: 20.6s from [facility], one hand on walker and one on WC, 80% upright posture 7/1: 22 seconds one hand on walker one hand on w/c  9/14: 48 seconds with full upright posture 11/24: 25 seconds with full upright posture, one hand on 2WW 1/3: 18 seocnds with one near LOB; 05/22/2020 18 seconds use of UEs to assist    Time 12    Period Weeks    Status Partially Met    Target Date 08/14/20      PT LONG TERM GOAL #3   Title Patient will negotiate a bathroom with a walker and Min A for increased indep in home and external environment    Baseline 9/13: able to some in standing, requires sitting down to wash hands 7/14: unable to do  11/24: able to complete voiding in standing and requires sitting down to wash hands 1/3: able to complete toileting in standing but needs to sit to wash hands; 05/22/2020 Pt still using WC to wash hands    Time 12    Period Weeks    Status Partially Met    Target Date 08/14/20      PT LONG TERM GOAL #4   Title Patient will increase BLE gross strength to 4+/5 as to improve functional strength for independent gait, increased standing tolerance and increased ADL ability.    Baseline 4/21: see note 06/02/19: hip  flexion 4-/5, extension/abd 3/5 12/30: hip flexion 4/5 hip extension and abduction 3/5 02/11/19: hip flexion 4/5 hip extension and abduction 2+/54-/5 RLE, 10/15: 4/5 RLE 11/7: 4/5 RLE 3/3: 4/5 7/1:  hip flexion 4/5 hip extension 2/5 hip abduction/adduciton 2/5; 05/22/2020 hip flexion 4+/5 B;  abduction/adduction 5/5 both B; hip extension 3/5 B    Time 12    Period Weeks    Status Partially Met    Target Date 08/14/20      PT LONG TERM GOAL #5   Title Patient will ambulate 250 ft with least assistive device and Supervision with no breaks to allow for increased mobility within home.    Baseline 1/3: 200 ft no breaks    Time 12    Period Weeks    Status On-going    Target Date 08/14/20      PT LONG TERM GOAL #6   Title Patient will ambulate 200 ft with least assistive device and Supervision with no breaks to allow for increased mobility within home.    Baseline 9/13: performed in previous session 7/14: 190 ft with RW 4/21: ambulate 150 ft 6/21: had to terminate after 90 ft due to cardiac  11/24: level surface with 2WW for 65 ft, limited due to fatigue and not feeling good today    Time 12    Period Weeks    Status Achieved    Target Date 04/11/19      PT LONG TERM GOAL #7  Title Patient will perform 10 MWT in <30 seconds for improved gait speed, gait mechanics, and functional capacity for mobility.    Baseline 10/11: 48s with BRW and WC follow; 9/13: terminated due to wardrobe malfunction 9/8: 42 seconds with R foot collapse 7/14: 37.98 seconds 6/21: 46.9 seconds with RW 07/28/19: 51 seconds with RW 06/02/19: 62.5 seconds with RW 11/4: 56 seconds with AFO and RW 12/30: 53 seconds with AFO and RW  04/10/20: 42 seconds with AFO and RW; 05/22/2020 0.2 m/s with RW, WC-follow, CGA    Time 12    Period Weeks    Status Partially Met    Target Date 08/14/20      PT LONG TERM GOAL #8   Title Patient will increase Berg Balance score by > 6 points (38/56) to demonstrate decreased fall risk during  functional activities.    Baseline 9/13: 31/56 7/14: 33/56 07/28/19: 32/56 6/21: 32/56 04/10/20: 33/56; 05/22/2020 BERG 28/56, impacted performance due to fatigue    Time 12    Period Weeks    Status Partially Met    Target Date 08/14/20                 Plan - 05/29/20 1452    Clinical Impression Statement First part of PT focused on reassessment of BERG today. Pt with noted increase of LOB throughout session where pt would lose balance posteriorly when standing and attempting to walk. Pt required up to mod-assist from PT to safely sit when LOB occured. PT did cue pt to shift to mid-foot, but pt with delayed or absent reactive postural control strategies necessary to maintain postural stability. Pt in session reported recent burning with urination in addition to continued intermittent swelling of LEs. PT examined pts B LES and noted mild posterior medial and lateral swelling of R ankle with pain to touch medially and laterally. Pt L LE with some swelling posterior-laterally but with no reports of pain. Due to these changes PT instructed pt to call his primary care physician to discuss losses of balance, LE swelling and burning with urination. Pt verbalized understanding. Pt will benefit from further skilled therapy to improve BLE strength, mobility, balance and gait to improve ease with ADLS and QOL.    Rehab Potential Poor    Clinical Impairments Affecting Rehab Potential hx of HTN, HLD, CVA, learning disability, Diabetes, brain tumor,     PT Frequency 2x / week    PT Duration 12 weeks    PT Treatment/Interventions ADLs/Self Care Home Management;Aquatic Therapy;Ultrasound;Moist Heat;Traction;DME Instruction;Gait training;Stair training;Functional mobility training;Therapeutic activities;Therapeutic exercise;Orthotic Fit/Training;Neuromuscular re-education;Balance training;Patient/family education;Manual techniques;Wheelchair mobility training;Passive range of motion;Energy  conservation;Taping;Visual/perceptual remediation/compensation    PT Next Visit Plan progress balance exercises, seated marches and gait    PT Home Exercise Plan TrA contraction, glute sets: needs updates. Potentially some time in supine flat in hospital bed, to improve hip extension deficits. Standing hip extension exercise; added seated marches to HEP 05/29/2020 and provided handout    Consulted and Agree with Plan of Care Patient           Patient will benefit from skilled therapeutic intervention in order to improve the following deficits and impairments:  Abnormal gait,Decreased activity tolerance,Decreased balance,Decreased knowledge of precautions,Decreased endurance,Decreased coordination,Decreased knowledge of use of DME,Decreased mobility,Decreased range of motion,Difficulty walking,Decreased safety awareness,Decreased strength,Impaired flexibility,Impaired perceived functional ability,Impaired tone,Postural dysfunction,Improper body mechanics,Pain  Visit Diagnosis: Muscle weakness (generalized)  Other abnormalities of gait and mobility  Other lack of coordination  Unsteadiness on feet  Problem List Patient Active Problem List   Diagnosis Date Noted  . Impaired glucose tolerance 03/23/2020  . Learning disability 03/23/2020  . Urinary and fecal incontinence 06/14/2019  . Chronic pain syndrome 03/23/2019  . Constipation 03/23/2019  . Dysarthria 03/23/2019  . Dysphagia 03/23/2019  . Learning difficulty 03/23/2019  . Morbid obesity ([place]) 03/23/2019  . Muscle weakness 03/23/2019  . Vitamin D deficiency 03/23/2019  . Bilateral carotid artery stenosis 12/15/2018  . Moderate aortic valve stenosis 12/15/2018  . BMI 45.0-49.9, adult ([place]) 11/10/2018  . Cerebrovascular accident ([place]) 05/02/2016  . Hemiparesis affecting right side as late effect of cerebrovascular accident ([place]) 05/01/2016  . Brain tumor ([place]) 11/24/2014  . Gastro-esophageal reflux disease without  esophagitis 11/24/2014  . Accident due to mechanical fall without injury 08/03/2014  . Essential hypertension 06/13/2014  . Mixed hyperlipidemia 06/13/2014  . Unspecified sequelae of cerebral infarction 06/13/2014  . 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/2014  . Type 2 diabetes mellitus without complication ([place]) 30/08/1100  . Neck mass 09/08/2013  . Swimmer's ear 09/08/2013  . Erectile dysfunction 01/03/2013  . Prediabetes 01/03/2013   Ricard Dillon PT, DPT 05/29/2020, 3:14 PM  [place] [facility] SERVICES [address] [place], [place], [address] Phone: [phone]   Fax:  [phone]  Name: Curtis Powell MRN: [id number] Date of Birth: November 22, 1965

## 2020-05-31 ENCOUNTER — Other Ambulatory Visit: Payer: Self-pay

## 2020-05-31 ENCOUNTER — Ambulatory Visit: Payer: Medicare Other

## 2020-05-31 ENCOUNTER — Encounter: Payer: Medicare HMO | Admitting: Occupational Therapy

## 2020-05-31 DIAGNOSIS — R2689 Other abnormalities of gait and mobility: Secondary | ICD-10-CM

## 2020-05-31 DIAGNOSIS — M6281 Muscle weakness (generalized): Secondary | ICD-10-CM

## 2020-05-31 DIAGNOSIS — R2681 Unsteadiness on feet: Secondary | ICD-10-CM

## 2020-05-31 DIAGNOSIS — R278 Other lack of coordination: Secondary | ICD-10-CM

## 2020-05-31 NOTE — Therapy (Signed)
[place] MAIN [facility] SERVICES [address] [place], [place], [address] Phone: [phone]   Fax:  [phone]  Physical Therapy Treatment  Patient Details  Name: Curtis Powell MRN: [id number] Date of Birth: 09-21-1965 No data recorded  Encounter Date: 05/31/2020   PT End of Session - 05/31/20 1448    Visit Number [id number]    Number of Visits 284    Date for PT Re-Evaluation 07/03/20    Authorization Type 1/10  PN 04/10/20    PT Start Time 1301    PT Stop Time 1346    PT Time Calculation (min) 45 min    Equipment Utilized During Treatment Gait belt    Activity Tolerance Patient tolerated treatment well    Behavior During Therapy [facility] for tasks assessed/performed           Past Medical History:  Diagnosis Date  . GERD (gastroesophageal reflux disease)   . Hyperlipidemia   . Hypertension   . Obesity   . Stroke [facility])     Past Surgical History:  Procedure Laterality Date  . BRAIN SURGERY      There were no vitals filed for this visit.   Subjective Assessment - 05/31/20 1404    Subjective Pt reports he is feeling better today. He says he "may have had something to drink" prior to last PT session and that it affected his balance. Pt says he will try not to do that before future sessions. Pt reports no swelling today and denies other sx.    Pertinent History  Patient is a pleasant 55 year old male who presents to physical therapy for weakness and immobility secondary to CVA.  Had a stroke in Feb 2018. He was previously fully independent, but this stroke caused severe residual deficits, mainly on the right side as well as speech, and now he is unable to walk or perform most of his ADLs on his own. Entire right side is very weak. He still has a little difficulty with speech but his swallowing is improved to baseline. His mom had to move in with him and now is his main caregiver.     Limitations Reading    How long can you sit comfortably? 5  minutes    How long can you stand comfortably? 5 minutes    How long can you walk comfortably? 8 minutes    Patient Stated Goals Walk without walker, walk further with walker, get some strength in back           Therapeutic Exercise  Ambulation for LE endurance 3/4 lap within main gym, 2x up and down hallway with two brief seated rest breaks between laps; cuing for proximity to walker, upright posture.  Seated adductor 3x15; pt reports exercise is "medium" Seated adductor isometric 2x10 sec   Semi squats/stands - 1x10, 1x5 with CGA and BUE assist against WC/RW  Therapeutic Activity: Standing using sink in bathroom x1, heavy B UE support on sink, BLEs braced against WC and CGA.  Assessment: Pt with improved tolerance for gait/upright activity today. He only had one LOB posteriorly with initial STS, requiring min assist from PT to sit back in [facility] safely. Pt practiced standing at sink in bathroom for handwashing. He exhibited increased weight-bearing through BUEs and bracing of BLEs against WC, with CGA from PT but no LOB. Pt will benefit from further skilled therapy to improve BLE strength, gait and balance to increase ease with ADLs.   PT Education -  05/31/20 1448    Education Details Pt educated on technique sit<>stand to sink/handwashing    Person(s) Educated Patient    Methods Explanation;Tactile cues;Verbal cues    Comprehension Verbalized understanding;Returned demonstration            PT Short Term Goals - 05/23/20 7124      PT SHORT TERM GOAL #1   Title Patient will be independent in home exercise program to improve strength/mobility for better functional independence with ADLs.    Baseline hep compliant    Time 2    Period Weeks    Status Achieved      PT SHORT TERM GOAL #2   Title Patient will require min cueing for STS transfer with CGA for increased independence with mobility.     Baseline CGA    Time 2    Period Weeks    Status Achieved      PT SHORT TERM GOAL  #3   Title Patient will maintain upright posture for > 15 seconds to demonstrate strengthened postural control muscualture     Baseline 18 seconds     Time 2    Period Weeks    Status Achieved             PT Long Term Goals - 05/23/20 0001      PT LONG TERM GOAL #1   Title Patient will perform bed mobility (rolling and supine <>sit) with Mod I  to increase mobility and  perform ADLs.    Baseline 9/13: able to perform with mod I occasional Min A when very fatigued 7/14: Min A 4/21: unable to perform 6/15: improving but still min-mod A  11/24: min-modA, difficulty breathing with changing positions 1/3: able to perform without assistance    Time 12    Period Weeks    Status Achieved      PT LONG TERM GOAL #2   Title Patient (< 42 years old) will complete five times sit to stand test in < 10 seconds indicating an increased LE strength and improved balance.    Baseline 9/13: 14 seconds with one near LOB 7/14: 14.31 seconds 6/21: 16.86 seconds 4/21: 18.8 seconds 06/02/19: 19.93 seconds one hand on RW, one hand on w/c 12/30: 16 secpmds BUE support 02/11/19: 36 seconds 5/19: 20.6s from [facility], one hand on walker and one on WC, 80% upright posture 7/1: 22 seconds one hand on walker one hand on w/c  9/14: 48 seconds with full upright posture 11/24: 25 seconds with full upright posture, one hand on 2WW 1/3: 18 seocnds with one near LOB; 05/22/2020 18 seconds use of UEs to assist    Time 12    Period Weeks    Status Partially Met    Target Date 08/14/20      PT LONG TERM GOAL #3   Title Patient will negotiate a bathroom with a walker and Min A for increased indep in home and external environment    Baseline 9/13: able to some in standing, requires sitting down to wash hands 7/14: unable to do  11/24: able to complete voiding in standing and requires sitting down to wash hands 1/3: able to complete toileting in standing but needs to sit to wash hands; 05/22/2020 Pt still using WC to wash hands    Time 12     Period Weeks    Status Partially Met    Target Date 08/14/20      PT LONG TERM GOAL #4   Title Patient  will increase BLE gross strength to 4+/5 as to improve functional strength for independent gait, increased standing tolerance and increased ADL ability.    Baseline 4/21: see note 06/02/19: hip flexion 4-/5, extension/abd 3/5 12/30: hip flexion 4/5 hip extension and abduction 3/5 02/11/19: hip flexion 4/5 hip extension and abduction 2+/54-/5 RLE, 10/15: 4/5 RLE 11/7: 4/5 RLE 3/3: 4/5 7/1:  hip flexion 4/5 hip extension 2/5 hip abduction/adduciton 2/5; 05/22/2020 hip flexion 4+/5 B;  abduction/adduction 5/5 both B; hip extension 3/5 B    Time 12    Period Weeks    Status Partially Met    Target Date 08/14/20      PT LONG TERM GOAL #5   Title Patient will ambulate 250 ft with least assistive device and Supervision with no breaks to allow for increased mobility within home.    Baseline 1/3: 200 ft no breaks    Time 12    Period Weeks    Status On-going    Target Date 08/14/20      PT LONG TERM GOAL #6   Title Patient will ambulate 200 ft with least assistive device and Supervision with no breaks to allow for increased mobility within home.    Baseline 9/13: performed in previous session 7/14: 190 ft with RW 4/21: ambulate 150 ft 6/21: had to terminate after 90 ft due to cardiac  11/24: level surface with 2WW for 65 ft, limited due to fatigue and not feeling good today    Time 12    Period Weeks    Status Achieved    Target Date 04/11/19      PT LONG TERM GOAL #7   Title Patient will perform 10 MWT in <30 seconds for improved gait speed, gait mechanics, and functional capacity for mobility.    Baseline 10/11: 48s with BRW and WC follow; 9/13: terminated due to wardrobe malfunction 9/8: 42 seconds with R foot collapse 7/14: 37.98 seconds 6/21: 46.9 seconds with RW 07/28/19: 51 seconds with RW 06/02/19: 62.5 seconds with RW 11/4: 56 seconds with AFO and RW 12/30: 53 seconds with AFO and RW   04/10/20: 42 seconds with AFO and RW; 05/22/2020 0.2 m/s with RW, WC-follow, CGA    Time 12    Period Weeks    Status Partially Met    Target Date 08/14/20      PT LONG TERM GOAL #8   Title Patient will increase Berg Balance score by > 6 points (38/56) to demonstrate decreased fall risk during functional activities.    Baseline 9/13: 31/56 7/14: 33/56 07/28/19: 32/56 6/21: 32/56 04/10/20: 33/56; 05/22/2020 BERG 28/56, impacted performance due to fatigue    Time 12    Period Weeks    Status Partially Met    Target Date 08/14/20                 Plan - 05/31/20 1450    Clinical Impression Statement Pt with improved tolerance for gait/upright activity today. He only had one LOB posteriorly with initial STS, requiring min assist from PT to sit back in [facility] safely. Pt practiced standing at sink in bathroom for handwashing. He exhibited increased weight-bearing through BUEs and bracing of BLEs against WC, with CGA from PT but no LOB. Pt will benefit from further skilled therapy to improve BLE strength, gait and balance to increase ease with ADLs.    Rehab Potential Poor    Clinical Impairments Affecting Rehab Potential hx of HTN, HLD, CVA, learning disability, Diabetes, brain  tumor,     PT Frequency 2x / week    PT Duration 12 weeks    PT Treatment/Interventions ADLs/Self Care Home Management;Aquatic Therapy;Ultrasound;Moist Heat;Traction;DME Instruction;Gait training;Stair training;Functional mobility training;Therapeutic activities;Therapeutic exercise;Orthotic Fit/Training;Neuromuscular re-education;Balance training;Patient/family education;Manual techniques;Wheelchair mobility training;Passive range of motion;Energy conservation;Taping;Visual/perceptual remediation/compensation    PT Next Visit Plan progress standing exercises and balance    PT Home Exercise Plan TrA contraction, glute sets: needs updates. Potentially some time in supine flat in hospital bed, to improve hip extension deficits.  Standing hip extension exercise; added seated marches to HEP 05/29/2020 and provided handout    Consulted and Agree with Plan of Care Patient           Patient will benefit from skilled therapeutic intervention in order to improve the following deficits and impairments:  Abnormal gait,Decreased activity tolerance,Decreased balance,Decreased knowledge of precautions,Decreased endurance,Decreased coordination,Decreased knowledge of use of DME,Decreased mobility,Decreased range of motion,Difficulty walking,Decreased safety awareness,Decreased strength,Impaired flexibility,Impaired perceived functional ability,Impaired tone,Postural dysfunction,Improper body mechanics,Pain  Visit Diagnosis: Muscle weakness (generalized)  Other abnormalities of gait and mobility  Other lack of coordination  Unsteadiness on feet     Problem List Patient Active Problem List   Diagnosis Date Noted  . Impaired glucose tolerance 03/23/2020  . Learning disability 03/23/2020  . Urinary and fecal incontinence 06/14/2019  . Chronic pain syndrome 03/23/2019  . Constipation 03/23/2019  . Dysarthria 03/23/2019  . Dysphagia 03/23/2019  . Learning difficulty 03/23/2019  . Morbid obesity ([place]) 03/23/2019  . Muscle weakness 03/23/2019  . Vitamin D deficiency 03/23/2019  . Bilateral carotid artery stenosis 12/15/2018  . Moderate aortic valve stenosis 12/15/2018  . BMI 45.0-49.9, adult ([place]) 11/10/2018  . Cerebrovascular accident ([place]) 05/02/2016  . Hemiparesis affecting right side as late effect of cerebrovascular accident ([place]) 05/01/2016  . Brain tumor ([place]) 11/24/2014  . Gastro-esophageal reflux disease without esophagitis 11/24/2014  . Accident due to mechanical fall without injury 08/03/2014  . Essential hypertension 06/13/2014  . Mixed hyperlipidemia 06/13/2014  . Unspecified sequelae of cerebral infarction 06/13/2014  . 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/2014  . Type 2 diabetes mellitus without complication ([place])  16/10/3708  . Neck mass 09/08/2013  . Swimmer's ear 09/08/2013  . Erectile dysfunction 01/03/2013  . Prediabetes 01/03/2013   Ricard Dillon PT, DPT 05/31/2020, 2:53 PM  [place] MAIN [facility] SERVICES [address] [place], [place], [address] Phone: [phone]   Fax:  [phone]  Name: Curtis Powell MRN: [id number] Date of Birth: [birth year]

## 2020-06-05 ENCOUNTER — Ambulatory Visit: Payer: Medicare Other

## 2020-06-05 ENCOUNTER — Ambulatory Visit: Payer: Medicare Other | Admitting: Occupational Therapy

## 2020-06-05 ENCOUNTER — Other Ambulatory Visit: Payer: Self-pay

## 2020-06-05 ENCOUNTER — Encounter: Payer: Self-pay | Admitting: Occupational Therapy

## 2020-06-05 DIAGNOSIS — R2689 Other abnormalities of gait and mobility: Secondary | ICD-10-CM

## 2020-06-05 DIAGNOSIS — M6281 Muscle weakness (generalized): Secondary | ICD-10-CM

## 2020-06-05 DIAGNOSIS — R278 Other lack of coordination: Secondary | ICD-10-CM

## 2020-06-05 DIAGNOSIS — I69351 Hemiplegia and hemiparesis following cerebral infarction affecting right dominant side: Secondary | ICD-10-CM

## 2020-06-05 DIAGNOSIS — R2681 Unsteadiness on feet: Secondary | ICD-10-CM

## 2020-06-05 NOTE — Therapy (Signed)
[place] MAIN [facility] SERVICES [address] [place], [place], [address] Phone: [phone]   Fax:  [phone]  Physical Therapy Treatment  Patient Details  Name: Curtis Powell MRN: [id number] Date of Birth: 1966/03/16 No data recorded  Encounter Date: 06/05/2020   PT End of Session - 06/05/20 1458    Visit Number [id number]    Number of Visits 284    Date for PT Re-Evaluation 07/03/20    Authorization Type 1/10  PN 04/10/20    PT Start Time 1350    PT Stop Time 1436    PT Time Calculation (min) 46 min    Equipment Utilized During Treatment Gait belt    Activity Tolerance Patient tolerated treatment well    Behavior During Therapy [facility] for tasks assessed/performed           Past Medical History:  Diagnosis Date  . GERD (gastroesophageal reflux disease)   . Hyperlipidemia   . Hypertension   . Obesity   . Stroke [facility])     Past Surgical History:  Procedure Laterality Date  . BRAIN SURGERY      There were no vitals filed for this visit.   Subjective Assessment - 06/05/20 1457    Subjective Pt reports no pain today. Denies any recent falls or near-falls.    Pertinent History  Patient is a pleasant 55 year old male who presents to physical therapy for weakness and immobility secondary to CVA.  Had a stroke in Feb 2018. He was previously fully independent, but this stroke caused severe residual deficits, mainly on the right side as well as speech, and now he is unable to walk or perform most of his ADLs on his own. Entire right side is very weak. He still has a little difficulty with speech but his swallowing is improved to baseline. His mom had to move in with him and now is his main caregiver.     Limitations Reading    How long can you sit comfortably? 5 minutes    How long can you stand comfortably? 5 minutes    How long can you walk comfortably? 8 minutes    Patient Stated Goals Walk without walker, walk further with walker, get some  strength in back     Currently in Pain? No/denies          Treatment:  Ambulation with RW, WC-follow for endurance/functional capacity and gait ability. Pt ambulated 1x106 ft and 1x108 ft with prolonged seated rest breaks between sets and after last set. VC for proximity to walker and upright posture. Pt rated exercise felt "hard" by last round.  GTB hamstring curls - 2x12 BLEs VC/TC for technique 3# AW seated marches 2x12, 1x6 VC for technique 3# AW LAQ - 3x12  VC for technique      PT Education - 06/05/20 1457    Education provided Yes    Education Details Technique/body mechanics with hamstring curls with GTB    Person(s) Educated Patient    Methods Explanation;Verbal cues;Tactile cues    Comprehension Verbalized understanding;Returned demonstration            PT Short Term Goals - 05/23/20 1093      PT SHORT TERM GOAL #1   Title Patient will be independent in home exercise program to improve strength/mobility for better functional independence with ADLs.    Baseline hep compliant    Time 2    Period Weeks    Status Achieved  PT SHORT TERM GOAL #2   Title Patient will require min cueing for STS transfer with CGA for increased independence with mobility.     Baseline CGA    Time 2    Period Weeks    Status Achieved      PT SHORT TERM GOAL #3   Title Patient will maintain upright posture for > 15 seconds to demonstrate strengthened postural control muscualture     Baseline 18 seconds     Time 2    Period Weeks    Status Achieved             PT Long Term Goals - 05/23/20 0001      PT LONG TERM GOAL #1   Title Patient will perform bed mobility (rolling and supine <>sit) with Mod I  to increase mobility and  perform ADLs.    Baseline 9/13: able to perform with mod I occasional Min A when very fatigued 7/14: Min A 4/21: unable to perform 6/15: improving but still min-mod A  11/24: min-modA, difficulty breathing with changing positions 1/3: able to perform  without assistance    Time 12    Period Weeks    Status Achieved      PT LONG TERM GOAL #2   Title Patient (< 62 years old) will complete five times sit to stand test in < 10 seconds indicating an increased LE strength and improved balance.    Baseline 9/13: 14 seconds with one near LOB 7/14: 14.31 seconds 6/21: 16.86 seconds 4/21: 18.8 seconds 06/02/19: 19.93 seconds one hand on RW, one hand on w/c 12/30: 16 secpmds BUE support 02/11/19: 36 seconds 5/19: 20.6s from [facility], one hand on walker and one on WC, 80% upright posture 7/1: 22 seconds one hand on walker one hand on w/c  9/14: 48 seconds with full upright posture 11/24: 25 seconds with full upright posture, one hand on 2WW 1/3: 18 seocnds with one near LOB; 05/22/2020 18 seconds use of UEs to assist    Time 12    Period Weeks    Status Partially Met    Target Date 08/14/20      PT LONG TERM GOAL #3   Title Patient will negotiate a bathroom with a walker and Min A for increased indep in home and external environment    Baseline 9/13: able to some in standing, requires sitting down to wash hands 7/14: unable to do  11/24: able to complete voiding in standing and requires sitting down to wash hands 1/3: able to complete toileting in standing but needs to sit to wash hands; 05/22/2020 Pt still using WC to wash hands    Time 12    Period Weeks    Status Partially Met    Target Date 08/14/20      PT LONG TERM GOAL #4   Title Patient will increase BLE gross strength to 4+/5 as to improve functional strength for independent gait, increased standing tolerance and increased ADL ability.    Baseline 4/21: see note 06/02/19: hip flexion 4-/5, extension/abd 3/5 12/30: hip flexion 4/5 hip extension and abduction 3/5 02/11/19: hip flexion 4/5 hip extension and abduction 2+/54-/5 RLE, 10/15: 4/5 RLE 11/7: 4/5 RLE 3/3: 4/5 7/1:  hip flexion 4/5 hip extension 2/5 hip abduction/adduciton 2/5; 05/22/2020 hip flexion 4+/5 B;  abduction/adduction 5/5 both B; hip  extension 3/5 B    Time 12    Period Weeks    Status Partially Met    Target Date  08/14/20      PT LONG TERM GOAL #5   Title Patient will ambulate 250 ft with least assistive device and Supervision with no breaks to allow for increased mobility within home.    Baseline 1/3: 200 ft no breaks    Time 12    Period Weeks    Status On-going    Target Date 08/14/20      PT LONG TERM GOAL #6   Title Patient will ambulate 200 ft with least assistive device and Supervision with no breaks to allow for increased mobility within home.    Baseline 9/13: performed in previous session 7/14: 190 ft with RW 4/21: ambulate 150 ft 6/21: had to terminate after 90 ft due to cardiac  11/24: level surface with 2WW for 65 ft, limited due to fatigue and not feeling good today    Time 12    Period Weeks    Status Achieved    Target Date 04/11/19      PT LONG TERM GOAL #7   Title Patient will perform 10 MWT in <30 seconds for improved gait speed, gait mechanics, and functional capacity for mobility.    Baseline 10/11: 48s with BRW and WC follow; 9/13: terminated due to wardrobe malfunction 9/8: 42 seconds with R foot collapse 7/14: 37.98 seconds 6/21: 46.9 seconds with RW 07/28/19: 51 seconds with RW 06/02/19: 62.5 seconds with RW 11/4: 56 seconds with AFO and RW 12/30: 53 seconds with AFO and RW  04/10/20: 42 seconds with AFO and RW; 05/22/2020 0.2 m/s with RW, WC-follow, CGA    Time 12    Period Weeks    Status Partially Met    Target Date 08/14/20      PT LONG TERM GOAL #8   Title Patient will increase Berg Balance score by > 6 points (38/56) to demonstrate decreased fall risk during functional activities.    Baseline 9/13: 31/56 7/14: 33/56 07/28/19: 32/56 6/21: 32/56 04/10/20: 33/56; 05/22/2020 BERG 28/56, impacted performance due to fatigue    Time 12    Period Weeks    Status Partially Met    Target Date 08/14/20                 Plan - 06/05/20 1458    Clinical Impression Statement Pt shows  improvement by ambulating for 106-108 ft 2x this session before requiring seated rest breaks. This indicates improved BLE endurance/strength and functional capacity. Pt will continue to benefit from further skilled therapy to improve BLE strength, endurance and functional capacity.    Rehab Potential Poor    Clinical Impairments Affecting Rehab Potential hx of HTN, HLD, CVA, learning disability, Diabetes, brain tumor,     PT Frequency 2x / week    PT Duration 12 weeks    PT Treatment/Interventions ADLs/Self Care Home Management;Aquatic Therapy;Ultrasound;Moist Heat;Traction;DME Instruction;Gait training;Stair training;Functional mobility training;Therapeutic activities;Therapeutic exercise;Orthotic Fit/Training;Neuromuscular re-education;Balance training;Patient/family education;Manual techniques;Wheelchair mobility training;Passive range of motion;Energy conservation;Taping;Visual/perceptual remediation/compensation    PT Next Visit Plan progress standing exercises and balance; progress walking endurance    PT Home Exercise Plan TrA contraction, glute sets: needs updates. Potentially some time in supine flat in hospital bed, to improve hip extension deficits. Standing hip extension exercise; added seated marches to HEP 05/29/2020 and provided handout    Consulted and Agree with Plan of Care Patient           Patient will benefit from skilled therapeutic intervention in order to improve the following deficits and impairments:  Abnormal gait,Decreased  activity tolerance,Decreased balance,Decreased knowledge of precautions,Decreased endurance,Decreased coordination,Decreased knowledge of use of DME,Decreased mobility,Decreased range of motion,Difficulty walking,Decreased safety awareness,Decreased strength,Impaired flexibility,Impaired perceived functional ability,Impaired tone,Postural dysfunction,Improper body mechanics,Pain  Visit Diagnosis: Muscle weakness (generalized)  Other abnormalities of  gait and mobility  Other lack of coordination  Unsteadiness on feet     Problem List Patient Active Problem List   Diagnosis Date Noted  . Impaired glucose tolerance 03/23/2020  . Learning disability 03/23/2020  . Urinary and fecal incontinence 06/14/2019  . Chronic pain syndrome 03/23/2019  . Constipation 03/23/2019  . Dysarthria 03/23/2019  . Dysphagia 03/23/2019  . Learning difficulty 03/23/2019  . Morbid obesity ([place]) 03/23/2019  . Muscle weakness 03/23/2019  . Vitamin D deficiency 03/23/2019  . Bilateral carotid artery stenosis 12/15/2018  . Moderate aortic valve stenosis 12/15/2018  . BMI 45.0-49.9, adult ([place]) 11/10/2018  . Cerebrovascular accident ([place]) 05/02/2016  . Hemiparesis affecting right side as late effect of cerebrovascular accident ([place]) 05/01/2016  . Brain tumor ([place]) 11/24/2014  . Gastro-esophageal reflux disease without esophagitis 11/24/2014  . Accident due to mechanical fall without injury 08/03/2014  . Essential hypertension 06/13/2014  . Mixed hyperlipidemia 06/13/2014  . Unspecified sequelae of cerebral infarction 06/13/2014  . 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/2014  . Type 2 diabetes mellitus without complication ([place]) 88/75/7972  . Neck mass 09/08/2013  . Swimmer's ear 09/08/2013  . Erectile dysfunction 01/03/2013  . Prediabetes 01/03/2013   Ricard Dillon PT, DPT 06/05/2020, 3:08 PM  [place] MAIN [facility] SERVICES [address] [place], [place], [address] Phone: [phone]   Fax:  [phone]  Name: Curtis Powell MRN: [id number] Date of Birth: 1965-05-11

## 2020-06-05 NOTE — Therapy (Signed)
[place] MAIN [facility] SERVICES [address] [place], [place], [address] Phone: [phone]   Fax:  [phone]  Occupational Therapy Treatment  Patient Details  Name: Curtis Powell MRN: [id number] Date of Birth: [birth year] No data recorded  Encounter Date: 06/05/2020   OT End of Session - 06/05/20 2038    Visit Number [id number]    Number of Visits 258    Date for OT Re-Evaluation 06/28/20    Authorization Type Progress reporting period starting 03/27/2020    OT Start Time 1300    OT Stop Time 1345    OT Time Calculation (min) 45 min    Activity Tolerance Patient tolerated treatment well    Behavior During Therapy [facility] for tasks assessed/performed           Past Medical History:  Diagnosis Date  . GERD (gastroesophageal reflux disease)   . Hyperlipidemia   . Hypertension   . Obesity   . Stroke [facility])     Past Surgical History:  Procedure Laterality Date  . BRAIN SURGERY      There were no vitals filed for this visit.   Subjective Assessment - 06/05/20 1307    Subjective  Pt reports he would like to work on strengthening today    Pertinent History Pt. is a 55 y.o. male who suffered a CVA on 05/01/2016. Pt. was admitted to the hospital. Once discharged, he received Home Health PT and OT services for about a month. Pt. has had multiple CVAs over the past 8 years, and has had multiple falls in the past 6 months. Pt. resides in an apartment. Pt. has caregivers for 80 hours. Pt.'s mother stays with pt. at night, and assists with IADL tasks.    Patient Stated Goals To be able to throw a ball and dribble a ball, do as much as I can for myself.     Currently in Pain? No/denies    Pain Score 0-No pain          Therapeutic Exercise:  Patient seen for ROM with reaching tasks in a variety of planes of motion from wheelchair position.  Strengthening with use of 2# weights dumbbells for overhead press, chest press, biceps curl, supination, wrist  flexion/extension for 10 reps for 2 sets each with rest breaks as needed.   Neuromuscular Reeducation: Use of Perfection game to place and remove small shape pieces to and from board with min cues.  Cues for prehension patterns for tip to tip grasp.    Response to tx: Pt continues to work towards improving strength, ROM and coordination skills.  Left UE with greater strength than right UE.  Patient reports he will plan to get light weights for home for home program.  Discussed potential discharge soon, he would like to be able to put on his jacket better before discharge.  Continue OT to increase independence in necessary daily tasks.                       OT Education - 06/05/20 2037    Education provided Yes    Education Details UE exercise, ROM/strengthening    Person(s) Educated Patient    Methods Explanation;Demonstration    Comprehension Verbalized understanding;Returned demonstration;Verbal cues required            OT Short Term Goals - 03/03/17 1459      OT SHORT TERM GOAL #1   Title `  OT Long Term Goals - 04/05/20 1400      OT LONG TERM GOAL #1   Title Pt. will UE functioning to be able to perfrom toilet hygiene with minimal assistance.    Baseline 01/14/2018 - Pt completes toileting hygiene independently. Pt requires assistance managing LB clothing.    Time 12    Period Weeks    Status Achieved      OT LONG TERM GOAL #2   Title Pt. will complete self grooming with minA    Baseline 02/24/2018 - Pt is independent with oral care and washing his face and occasionally requires increased time if he is having a bad day. Pt's shaving is completed by his barber.    Time 12    Period Weeks    Status Achieved      OT LONG TERM GOAL #3   Title Pt. will perform self-feeding skills with modified indepndence    Baseline 01/14/2018 - Pt is able to complete self-feeding independently with minimal spillage. Pt is unable to cut meat at this time.     Time 12    Period Weeks    Status Achieved      OT LONG TERM GOAL #4   Title Pt. will perform self dressing with minA and A/E as needed.    Baseline Pt. requires [place] LE dressing. Pt. has difficulty with LE the right LE secondary to AFO. Pt. continues to be able to donn a T shirt independently with increased time. MinA donning a jacket, Mod A donning pants over his feet, and socks, minA with shoes.    Time 12    Period Weeks    Status On-going    Target Date 06/27/20      OT LONG TERM GOAL #5   Title Pt. will perform light home making tasks with minA    Baseline Pt is able to wash dishes while seated in w/c with increased accessibility of new apartment. Pt. is able to fold his clothes, his moms puts them in the and out of the washer, and dryer, and puts them onto hangers. Caregivers perform vacuuming, and dusting.    Time 12    Period Weeks    Status Achieved      OT LONG TERM GOAL #6   Title Pt. will write his name efficiently with 100% legibility    Baseline Pt. continues to present with 755 legibility with printed form, and 50% legibility with cursive form. Pt. continues to present with 75% legibility with increased time required when printing his name, and 50% legibility when cursive writing his name to sign in when riding the [place]    Time 12    Period Weeks    Status On-going    Target Date 06/27/20      OT LONG TERM GOAL #7   Title Pt will independently and consistently follow HEP to increase UE strength to increase functional independence    Baseline Pt. continues to work towards independence with exercises    Time 12    Period Weeks    Status On-going    Target Date 06/27/20      OT LONG TERM GOAL #8   Title Pt. will require supervision ironing a shirt.    Baseline Pt. has progressed CGA, and complete set-up seated using a tabletop iron    Time 12    Period Weeks    Status Partially Met    Target Date 06/27/20      OT LONG TERM  GOAL  #9   Baseline Pt. will  independently be able to sew a button onto a shirt.    Time 12    Period Weeks    Status On-going    Target Date 06/27/20      OT LONG TERM GOAL  #10   TITLE Pt. will independently be able to throw a ball    Baseline Pt. continues to be limited with throwing overhand, however pt. is able to perform underhand throwing.    Time 12    Period Weeks    Status On-going    Target Date 06/27/20      OT LONG TERM GOAL  #11   TITLE Pt. will improve UE functional reaching to be able to independently use his RUE to hand his clothes in the closest.    Baseline Pt. uses a reacher to complete. Pt. continues to improve with reaching up with the RUE, however has difficulty reaching the hanger rack in the closet. Pt. now uses a reacher.    Time 12    Period Weeks    Status On-going    Target Date 06/27/20      OT LONG TERM GOAL  #12   TITLE Pt. will  be independent with shaving using an electric razer.    Baseline Pt. is able to use the electric razer independently, however is unable to access the position of the plug and requires assist form caregivers secondary to the size of the bathroom, and location of the plug.    Time 12    Period Weeks    Status Achieved      OT LONG TERM GOAL  #13   TITLE Pt. will increase BUE strength to be able to prepare for assisting with yardwork.    Baseline Pt. BUE strength conitnues to be 4+/5 Pt. reports that he has been able to get out to the front.    Time 12    Period Weeks    Status On-going      OT LONG TERM GOAL  #14   TITLE Pt. will improve activity toelrance in standing to be able to complete standing ADLs, and IADLs with SBA.    Baseline Pt. is limited with ADL performance in standing.    Time 12    Period Weeks    Status On-going    Target Date 06/27/20                 Plan - 06/05/20 2038    Clinical Impression Statement Pt continues to work towards improving strength, ROM and coordination skills.  Left UE with greater strength than  right UE.  Patient reports he will plan to get light weights for home for home program.  Discussed potential discharge soon, he would like to be able to put on his jacket better before discharge.  Continue OT to increase independence in necessary daily tasks.    OT Occupational Profile and History Problem Focused Assessment - Including review of records relating to presenting problem    Occupational performance deficits (Please refer to evaluation for details): ADL's;IADL's    Body Structure / Function / Physical Skills ADL;FMC;ROM;IADL;Pain;Coordination    Rehab Potential Fair    Clinical Decision Making Several treatment options, min-mod task modification necessary    Comorbidities Affecting Occupational Performance: May have comorbidities impacting occupational performance    Modification or Assistance to Complete Evaluation  Min-Moderate modification of tasks or assist with assess necessary to complete eval    OT  Frequency 1x / week    OT Duration 12 weeks    OT Treatment/Interventions Self-care/ADL training;Patient/family education;DME and/or AE instruction;Therapeutic exercise;Neuromuscular education    Consulted and Agree with Plan of Care Patient           Patient will benefit from skilled therapeutic intervention in order to improve the following deficits and impairments:   Body Structure / Function / Physical Skills: ADL,FMC,ROM,IADL,Pain,Coordination       Visit Diagnosis: Muscle weakness (generalized)  Other lack of coordination  Hemiplegia and hemiparesis following cerebral infarction affecting right dominant side [facility])    Problem List Patient Active Problem List   Diagnosis Date Noted  . Impaired glucose tolerance 03/23/2020  . Learning disability 03/23/2020  . Urinary and fecal incontinence 06/14/2019  . Chronic pain syndrome 03/23/2019  . Constipation 03/23/2019  . Dysarthria 03/23/2019  . Dysphagia 03/23/2019  . Learning difficulty 03/23/2019  . Morbid  obesity ([place]) 03/23/2019  . Muscle weakness 03/23/2019  . Vitamin D deficiency 03/23/2019  . Bilateral carotid artery stenosis 12/15/2018  . Moderate aortic valve stenosis 12/15/2018  . BMI 45.0-49.9, adult ([place]) 11/10/2018  . Cerebrovascular accident ([place]) 05/02/2016  . Hemiparesis affecting right side as late effect of cerebrovascular accident ([place]) 05/01/2016  . Brain tumor ([place]) 11/24/2014  . Gastro-esophageal reflux disease without esophagitis 11/24/2014  . Accident due to mechanical fall without injury 08/03/2014  . Essential hypertension 06/13/2014  . Mixed hyperlipidemia 06/13/2014  . Unspecified sequelae of cerebral infarction 06/13/2014  . 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/2014  . Type 2 diabetes mellitus without complication ([place]) 68/06/2120  . Neck mass 09/08/2013  . Swimmer's ear 09/08/2013  . Erectile dysfunction 01/03/2013  . Prediabetes 01/03/2013   Achilles Dunk, OTR/L, CLT  Shakeema Lippman 06/05/2020, 9:31 PM  [place] MAIN [facility] SERVICES [address] [place], [place], [address] Phone: [phone]   Fax:  [phone]  Name: Curtis Powell MRN: [id number] Date of Birth: 04-29-1965

## 2020-06-07 ENCOUNTER — Ambulatory Visit: Payer: Medicare Other | Attending: Family Medicine

## 2020-06-07 ENCOUNTER — Encounter: Payer: Medicare HMO | Admitting: Occupational Therapy

## 2020-06-07 ENCOUNTER — Other Ambulatory Visit: Payer: Self-pay

## 2020-06-07 DIAGNOSIS — M6281 Muscle weakness (generalized): Secondary | ICD-10-CM

## 2020-06-07 DIAGNOSIS — R531 Weakness: Secondary | ICD-10-CM | POA: Insufficient documentation

## 2020-06-07 DIAGNOSIS — R2681 Unsteadiness on feet: Secondary | ICD-10-CM | POA: Insufficient documentation

## 2020-06-07 DIAGNOSIS — R2689 Other abnormalities of gait and mobility: Secondary | ICD-10-CM

## 2020-06-07 DIAGNOSIS — R278 Other lack of coordination: Secondary | ICD-10-CM | POA: Insufficient documentation

## 2020-06-07 NOTE — Therapy (Signed)
[place] MAIN [facility] SERVICES [address] [place], [place], [address] Phone: [phone]   Fax:  [phone]  Physical Therapy Treatment  Patient Details  Name: Curtis Powell MRN: [id number] Date of Birth: 1965-07-17 No data recorded  Encounter Date: 06/07/2020   PT End of Session - 06/07/20 1401    Visit Number [id number]    Number of Visits 284    Date for PT Re-Evaluation 07/03/20    Authorization Type 1/10  PN 04/10/20    PT Start Time 1300    PT Stop Time 1347    PT Time Calculation (min) 47 min    Equipment Utilized During Treatment Gait belt    Activity Tolerance Patient tolerated treatment well;Patient limited by fatigue    Behavior During Therapy [facility] for tasks assessed/performed           Past Medical History:  Diagnosis Date  . GERD (gastroesophageal reflux disease)   . Hyperlipidemia   . Hypertension   . Obesity   . Stroke [facility])     Past Surgical History:  Procedure Laterality Date  . BRAIN SURGERY      There were no vitals filed for this visit.   Treatment:  Ambulation with RW, WC-follow for endurance/functional capacity and gait ability. Pt ambulated 1x133 ft with seated rest break after first 54 ft. Pt continues to require VC for proximity to walker and upright posture.    Followed by pt bathroom break.  GTB hamstring curls - 1x15 BLEs, 1x12 BLEs; VC/TC for technique  3# AW seated marches 1x18, 1x16 VC/TC for technique/ increased AROM  STS- 5x throughout session (3 in a row with standing balance exercise) with VC/TC for hand placement/technique.    Neuromuscular Re-Education  Standing balance at RW 4x30 sec, CGA;  rest break between sets  Education provided throughout regarding exercise technique and sequencing.    PT Education - 06/07/20 1400    Education Details Exercise technique with seated marches    Person(s) Educated Patient    Methods Explanation;Tactile cues;Verbal cues    Comprehension  Verbalized understanding;Returned demonstration            PT Short Term Goals - 05/23/20 8016      PT SHORT TERM GOAL #1   Title Patient will be independent in home exercise program to improve strength/mobility for better functional independence with ADLs.    Baseline hep compliant    Time 2    Period Weeks    Status Achieved      PT SHORT TERM GOAL #2   Title Patient will require min cueing for STS transfer with CGA for increased independence with mobility.     Baseline CGA    Time 2    Period Weeks    Status Achieved      PT SHORT TERM GOAL #3   Title Patient will maintain upright posture for > 15 seconds to demonstrate strengthened postural control muscualture     Baseline 18 seconds     Time 2    Period Weeks    Status Achieved             PT Long Term Goals - 05/23/20 0001      PT LONG TERM GOAL #1   Title Patient will perform bed mobility (rolling and supine <>sit) with Mod I  to increase mobility and  perform ADLs.    Baseline 9/13: able to perform with mod I occasional Min A when very fatigued  7/14: Min A 4/21: unable to perform 6/15: improving but still min-mod A  11/24: min-modA, difficulty breathing with changing positions 1/3: able to perform without assistance    Time 12    Period Weeks    Status Achieved      PT LONG TERM GOAL #2   Title Patient (< 74 years old) will complete five times sit to stand test in < 10 seconds indicating an increased LE strength and improved balance.    Baseline 9/13: 14 seconds with one near LOB 7/14: 14.31 seconds 6/21: 16.86 seconds 4/21: 18.8 seconds 06/02/19: 19.93 seconds one hand on RW, one hand on w/c 12/30: 16 secpmds BUE support 02/11/19: 36 seconds 5/19: 20.6s from [place], one hand on walker and one on WC, 80% upright posture 7/1: 22 seconds one hand on walker one hand on w/c  9/14: 48 seconds with full upright posture 11/24: 25 seconds with full upright posture, one hand on 2WW 1/3: 18 seocnds with one near LOB; 05/22/2020 18  seconds use of UEs to assist    Time 12    Period Weeks    Status Partially Met    Target Date 08/14/20      PT LONG TERM GOAL #3   Title Patient will negotiate a bathroom with a walker and Min A for increased indep in home and external environment    Baseline 9/13: able to some in standing, requires sitting down to wash hands 7/14: unable to do  11/24: able to complete voiding in standing and requires sitting down to wash hands 1/3: able to complete toileting in standing but needs to sit to wash hands; 05/22/2020 Pt still using WC to wash hands    Time 12    Period Weeks    Status Partially Met    Target Date 08/14/20      PT LONG TERM GOAL #4   Title Patient will increase BLE gross strength to 4+/5 as to improve functional strength for independent gait, increased standing tolerance and increased ADL ability.    Baseline 4/21: see note 06/02/19: hip flexion 4-/5, extension/abd 3/5 12/30: hip flexion 4/5 hip extension and abduction 3/5 02/11/19: hip flexion 4/5 hip extension and abduction 2+/54-/5 RLE, 10/15: 4/5 RLE 11/7: 4/5 RLE 3/3: 4/5 7/1:  hip flexion 4/5 hip extension 2/5 hip abduction/adduciton 2/5; 05/22/2020 hip flexion 4+/5 B;  abduction/adduction 5/5 both B; hip extension 3/5 B    Time 12    Period Weeks    Status Partially Met    Target Date 08/14/20      PT LONG TERM GOAL #5   Title Patient will ambulate 250 ft with least assistive device and Supervision with no breaks to allow for increased mobility within home.    Baseline 1/3: 200 ft no breaks    Time 12    Period Weeks    Status On-going    Target Date 08/14/20      PT LONG TERM GOAL #6   Title Patient will ambulate 200 ft with least assistive device and Supervision with no breaks to allow for increased mobility within home.    Baseline 9/13: performed in previous session 7/14: 190 ft with RW 4/21: ambulate 150 ft 6/21: had to terminate after 90 ft due to cardiac  11/24: level surface with 2WW for 65 ft, limited due to  fatigue and not feeling good today    Time 12    Period Weeks    Status Achieved    Target  Date 04/11/19      PT LONG TERM GOAL #7   Title Patient will perform 10 MWT in <30 seconds for improved gait speed, gait mechanics, and functional capacity for mobility.    Baseline 10/11: 48s with BRW and WC follow; 9/13: terminated due to wardrobe malfunction 9/8: 42 seconds with R foot collapse 7/14: 37.98 seconds 6/21: 46.9 seconds with RW 07/28/19: 51 seconds with RW 06/02/19: 62.5 seconds with RW 11/4: 56 seconds with AFO and RW 12/30: 53 seconds with AFO and RW  04/10/20: 42 seconds with AFO and RW; 05/22/2020 0.2 m/s with RW, WC-follow, CGA    Time 12    Period Weeks    Status Partially Met    Target Date 08/14/20      PT LONG TERM GOAL #8   Title Patient will increase Berg Balance score by > 6 points (38/56) to demonstrate decreased fall risk during functional activities.    Baseline 9/13: 31/56 7/14: 33/56 07/28/19: 32/56 6/21: 32/56 04/10/20: 33/56; 05/22/2020 BERG 28/56, impacted performance due to fatigue    Time 12    Period Weeks    Status Partially Met    Target Date 08/14/20                 Plan - 06/07/20 1401    Clinical Impression Statement Pt endurance with ambulation slightly limited within session due to pt report of ambulating up/down a hill prior to therapy at home with home assistant. However, pt was able to progress volume of reps with seated hamstring curls and marches. Pt able to perform standing balance this session without UE support and with CGA. Pt will benefit from further skilled therapy to improve functional capacity, BLE strength, balance and endurance for increased independence with ADLs.    Rehab Potential Poor    Clinical Impairments Affecting Rehab Potential hx of HTN, HLD, CVA, learning disability, Diabetes, brain tumor,     PT Frequency 2x / week    PT Duration 12 weeks    PT Treatment/Interventions ADLs/Self Care Home Management;Aquatic  Therapy;Ultrasound;Moist Heat;Traction;DME Instruction;Gait training;Stair training;Functional mobility training;Therapeutic activities;Therapeutic exercise;Orthotic Fit/Training;Neuromuscular re-education;Balance training;Patient/family education;Manual techniques;Wheelchair mobility training;Passive range of motion;Energy conservation;Taping;Visual/perceptual remediation/compensation    PT Next Visit Plan standing balance tasks    PT Home Exercise Plan TrA contraction, glute sets: needs updates. Potentially some time in supine flat in hospital bed, to improve hip extension deficits. Standing hip extension exercise; added seated marches to HEP 05/29/2020 and provided handout    Consulted and Agree with Plan of Care Patient           Patient will benefit from skilled therapeutic intervention in order to improve the following deficits and impairments:  Abnormal gait,Decreased activity tolerance,Decreased balance,Decreased knowledge of precautions,Decreased endurance,Decreased coordination,Decreased knowledge of use of DME,Decreased mobility,Decreased range of motion,Difficulty walking,Decreased safety awareness,Decreased strength,Impaired flexibility,Impaired perceived functional ability,Impaired tone,Postural dysfunction,Improper body mechanics,Pain  Visit Diagnosis: Other abnormalities of gait and mobility  Muscle weakness (generalized)  Unsteadiness on feet     Problem List Patient Active Problem List   Diagnosis Date Noted  . Impaired glucose tolerance 03/23/2020  . Learning disability 03/23/2020  . Urinary and fecal incontinence 06/14/2019  . Chronic pain syndrome 03/23/2019  . Constipation 03/23/2019  . Dysarthria 03/23/2019  . Dysphagia 03/23/2019  . Learning difficulty 03/23/2019  . Morbid obesity ([place]) 03/23/2019  . Muscle weakness 03/23/2019  . Vitamin D deficiency 03/23/2019  . Bilateral carotid artery stenosis 12/15/2018  . Moderate aortic valve stenosis 12/15/2018  .  BMI 45.0-49.9, adult ([place]) 11/10/2018  . Cerebrovascular accident ([place]) 05/02/2016  . Hemiparesis affecting right side as late effect of cerebrovascular accident ([place]) 05/01/2016  . Brain tumor ([place]) 11/24/2014  . Gastro-esophageal reflux disease without esophagitis 11/24/2014  . Accident due to mechanical fall without injury 08/03/2014  . Essential hypertension 06/13/2014  . Mixed hyperlipidemia 06/13/2014  . Unspecified sequelae of cerebral infarction 06/13/2014  . 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/2014  . Type 2 diabetes mellitus without complication ([place]) 44/81/8563  . Neck mass 09/08/2013  . Swimmer's ear 09/08/2013  . Erectile dysfunction 01/03/2013  . Prediabetes 01/03/2013   Ricard Dillon PT, DPT  Zollie Pee 06/07/2020, 2:09 PM  [place] MAIN [facility] SERVICES [address] [place], [place], [address] Phone: [phone]   Fax:  [phone]  Name: Curtis Powell MRN: [id number] Date of Birth: 1966-03-16

## 2020-06-12 ENCOUNTER — Other Ambulatory Visit: Payer: Self-pay

## 2020-06-12 ENCOUNTER — Ambulatory Visit: Payer: Medicare Other

## 2020-06-12 DIAGNOSIS — R2689 Other abnormalities of gait and mobility: Secondary | ICD-10-CM

## 2020-06-12 DIAGNOSIS — R2681 Unsteadiness on feet: Secondary | ICD-10-CM

## 2020-06-12 DIAGNOSIS — M6281 Muscle weakness (generalized): Secondary | ICD-10-CM

## 2020-06-12 NOTE — Therapy (Signed)
[place] MAIN [facility] SERVICES [address] [place], [place], [address] Phone: [phone]   Fax:  [phone]  Physical Therapy Treatment  Patient Details  Name: Curtis Powell MRN: [id number] Date of Birth: 09-22-1965 No data recorded  Encounter Date: 06/12/2020   PT End of Session - 06/12/20 1351    Visit Number [id number]    Number of Visits 284    Date for PT Re-Evaluation 07/03/20    Authorization Type 2/10  PN 04/10/20    PT Start Time 1301    PT Stop Time 1346    PT Time Calculation (min) 45 min    Equipment Utilized During Treatment Gait belt    Activity Tolerance Patient tolerated treatment well;Patient limited by fatigue    Behavior During Therapy [facility] for tasks assessed/performed           Past Medical History:  Diagnosis Date  . GERD (gastroesophageal reflux disease)   . Hyperlipidemia   . Hypertension   . Obesity   . Stroke [facility])     Past Surgical History:  Procedure Laterality Date  . BRAIN SURGERY      There were no vitals filed for this visit.   Subjective Assessment - 06/12/20 1326    Subjective Pt reports being in his normal state. No pain or anything of that nature. No falls.    Pertinent History  Patient is a pleasant 55 year old male who presents to physical therapy for weakness and immobility secondary to CVA.  Had a stroke in Feb 2018. He was previously fully independent, but this stroke caused severe residual deficits, mainly on the right side as well as speech, and now he is unable to walk or perform most of his ADLs on his own. Entire right side is very weak. He still has a little difficulty with speech but his swallowing is improved to baseline. His mom had to move in with him and now is his main caregiver.     Limitations Reading    How long can you sit comfortably? 5 minutes    How long can you stand comfortably? 5 minutes    How long can you walk comfortably? 8 minutes    Patient Stated Goals Walk without  walker, walk further with walker, get some strength in back     Currently in Pain? No/denies           There.ex: CGA+1 for all standing and walking activities   First 10 min of therapy spent with pt requiring a restroom break. Pt required W/c set up but otherwise indep with use of rest room.    AMB 100' with RW and W/c follow for safety.  CGA+1. VC for upright posture and increasing weight shift onto RLE.  Increased time to complete and heavy BUE support needed on RW.  Seated alternatng marches: 1x20/LE.   Standing weight shifts at RW with focus onto RLE due to RLE > LLE weakness. 1x10.   Standing marches: 1x10 alternating LE's. Mod VC's for upright posture and strong BUE support required for marching. Contralateral lat lean as compensation for weak hip flexors.   Seated GTB resisted hip ER/abd: 1x12, 3 sec holds.   Seated hamstring curls: R/L 1x12. More difficulty on R > L.       Pt required mod verbal cues for upright posture and correct form to perform exercises. Heavy reliance on BUE's on RW for ambulatory tasks, weight shifts and standing marches. Frequent seated rest breaks  required as well due to LE fatigue.    PT Education - 06/12/20 1326    Education provided Yes    Education Details gait mechanics. Form/technique with exercise.    Person(s) Educated Patient    Methods Explanation;Demonstration;Tactile cues;Verbal cues    Comprehension Verbalized understanding;Returned demonstration            PT Short Term Goals - 05/23/20 2409      PT SHORT TERM GOAL #1   Title Patient will be independent in home exercise program to improve strength/mobility for better functional independence with ADLs.    Baseline hep compliant    Time 2    Period Weeks    Status Achieved      PT SHORT TERM GOAL #2   Title Patient will require min cueing for STS transfer with CGA for increased independence with mobility.     Baseline CGA    Time 2    Period Weeks    Status Achieved       PT SHORT TERM GOAL #3   Title Patient will maintain upright posture for > 15 seconds to demonstrate strengthened postural control muscualture     Baseline 18 seconds     Time 2    Period Weeks    Status Achieved             PT Long Term Goals - 05/23/20 0001      PT LONG TERM GOAL #1   Title Patient will perform bed mobility (rolling and supine <>sit) with Mod I  to increase mobility and  perform ADLs.    Baseline 9/13: able to perform with mod I occasional Min A when very fatigued 7/14: Min A 4/21: unable to perform 6/15: improving but still min-mod A  11/24: min-modA, difficulty breathing with changing positions 1/3: able to perform without assistance    Time 12    Period Weeks    Status Achieved      PT LONG TERM GOAL #2   Title Patient (< 92 years old) will complete five times sit to stand test in < 10 seconds indicating an increased LE strength and improved balance.    Baseline 9/13: 14 seconds with one near LOB 7/14: 14.31 seconds 6/21: 16.86 seconds 4/21: 18.8 seconds 06/02/19: 19.93 seconds one hand on RW, one hand on w/c 12/30: 16 secpmds BUE support 02/11/19: 36 seconds 5/19: 20.6s from [facility], one hand on walker and one on WC, 80% upright posture 7/1: 22 seconds one hand on walker one hand on w/c  9/14: 48 seconds with full upright posture 11/24: 25 seconds with full upright posture, one hand on 2WW 1/3: 18 seocnds with one near LOB; 05/22/2020 18 seconds use of UEs to assist    Time 12    Period Weeks    Status Partially Met    Target Date 08/14/20      PT LONG TERM GOAL #3   Title Patient will negotiate a bathroom with a walker and Min A for increased indep in home and external environment    Baseline 9/13: able to some in standing, requires sitting down to wash hands 7/14: unable to do  11/24: able to complete voiding in standing and requires sitting down to wash hands 1/3: able to complete toileting in standing but needs to sit to wash hands; 05/22/2020 Pt still using WC to  wash hands    Time 12    Period Weeks    Status Partially Met  Target Date 08/14/20      PT LONG TERM GOAL #4   Title Patient will increase BLE gross strength to 4+/5 as to improve functional strength for independent gait, increased standing tolerance and increased ADL ability.    Baseline 4/21: see note 06/02/19: hip flexion 4-/5, extension/abd 3/5 12/30: hip flexion 4/5 hip extension and abduction 3/5 02/11/19: hip flexion 4/5 hip extension and abduction 2+/54-/5 RLE, 10/15: 4/5 RLE 11/7: 4/5 RLE 3/3: 4/5 7/1:  hip flexion 4/5 hip extension 2/5 hip abduction/adduciton 2/5; 05/22/2020 hip flexion 4+/5 B;  abduction/adduction 5/5 both B; hip extension 3/5 B    Time 12    Period Weeks    Status Partially Met    Target Date 08/14/20      PT LONG TERM GOAL #5   Title Patient will ambulate 250 ft with least assistive device and Supervision with no breaks to allow for increased mobility within home.    Baseline 1/3: 200 ft no breaks    Time 12    Period Weeks    Status On-going    Target Date 08/14/20      PT LONG TERM GOAL #6   Title Patient will ambulate 200 ft with least assistive device and Supervision with no breaks to allow for increased mobility within home.    Baseline 9/13: performed in previous session 7/14: 190 ft with RW 4/21: ambulate 150 ft 6/21: had to terminate after 90 ft due to cardiac  11/24: level surface with 2WW for 65 ft, limited due to fatigue and not feeling good today    Time 12    Period Weeks    Status Achieved    Target Date 04/11/19      PT LONG TERM GOAL #7   Title Patient will perform 10 MWT in <30 seconds for improved gait speed, gait mechanics, and functional capacity for mobility.    Baseline 10/11: 48s with BRW and WC follow; 9/13: terminated due to wardrobe malfunction 9/8: 42 seconds with R foot collapse 7/14: 37.98 seconds 6/21: 46.9 seconds with RW 07/28/19: 51 seconds with RW 06/02/19: 62.5 seconds with RW 11/4: 56 seconds with AFO and RW 12/30: 53  seconds with AFO and RW  04/10/20: 42 seconds with AFO and RW; 05/22/2020 0.2 m/s with RW, WC-follow, CGA    Time 12    Period Weeks    Status Partially Met    Target Date 08/14/20      PT LONG TERM GOAL #8   Title Patient will increase Berg Balance score by > 6 points (38/56) to demonstrate decreased fall risk during functional activities.    Baseline 9/13: 31/56 7/14: 33/56 07/28/19: 32/56 6/21: 32/56 04/10/20: 33/56; 05/22/2020 BERG 28/56, impacted performance due to fatigue    Time 12    Period Weeks    Status Partially Met    Target Date 08/14/20                 Plan - 06/12/20 1351    Clinical Impression Statement Pt required increased time to perform amb tasks and for restroom break. Pt displayed ability to amb 100' with RW and W/c follow with no seated rest compared to previous PT session per documentation. Pt requires heavy BUE support for ambulatory tasks on RW with L lat lean due to stronger side. Focus of session on improving B hip strength and R weight shifts to improve gait mechanics. MOd verbal and tactile cues required for correct form/technique. Pt can benefit from  further skilled PT treatment to improve functional mobility.    Rehab Potential Poor    Clinical Impairments Affecting Rehab Potential hx of HTN, HLD, CVA, learning disability, Diabetes, brain tumor,     PT Frequency 2x / week    PT Duration 12 weeks    PT Treatment/Interventions ADLs/Self Care Home Management;Aquatic Therapy;Ultrasound;Moist Heat;Traction;DME Instruction;Gait training;Stair training;Functional mobility training;Therapeutic activities;Therapeutic exercise;Orthotic Fit/Training;Neuromuscular re-education;Balance training;Patient/family education;Manual techniques;Wheelchair mobility training;Passive range of motion;Energy conservation;Taping;Visual/perceptual remediation/compensation    PT Next Visit Plan standing balance tasks    PT Home Exercise Plan TrA contraction, glute sets: needs updates.  Potentially some time in supine flat in hospital bed, to improve hip extension deficits. Standing hip extension exercise; added seated marches to HEP 05/29/2020 and provided handout    Consulted and Agree with Plan of Care Patient           Patient will benefit from skilled therapeutic intervention in order to improve the following deficits and impairments:  Abnormal gait,Decreased activity tolerance,Decreased balance,Decreased knowledge of precautions,Decreased endurance,Decreased coordination,Decreased knowledge of use of DME,Decreased mobility,Decreased range of motion,Difficulty walking,Decreased safety awareness,Decreased strength,Impaired flexibility,Impaired perceived functional ability,Impaired tone,Postural dysfunction,Improper body mechanics,Pain  Visit Diagnosis: Other abnormalities of gait and mobility  Muscle weakness (generalized)  Unsteadiness on feet     Problem List Patient Active Problem List   Diagnosis Date Noted  . Impaired glucose tolerance 03/23/2020  . Learning disability 03/23/2020  . Urinary and fecal incontinence 06/14/2019  . Chronic pain syndrome 03/23/2019  . Constipation 03/23/2019  . Dysarthria 03/23/2019  . Dysphagia 03/23/2019  . Learning difficulty 03/23/2019  . Morbid obesity ([place]) 03/23/2019  . Muscle weakness 03/23/2019  . Vitamin D deficiency 03/23/2019  . Bilateral carotid artery stenosis 12/15/2018  . Moderate aortic valve stenosis 12/15/2018  . BMI 45.0-49.9, adult ([place]) 11/10/2018  . Cerebrovascular accident ([place]) 05/02/2016  . Hemiparesis affecting right side as late effect of cerebrovascular accident ([place]) 05/01/2016  . Brain tumor ([place]) 11/24/2014  . Gastro-esophageal reflux disease without esophagitis 11/24/2014  . Accident due to mechanical fall without injury 08/03/2014  . Essential hypertension 06/13/2014  . Mixed hyperlipidemia 06/13/2014  . Unspecified sequelae of cerebral infarction 06/13/2014  . 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/2014   . Type 2 diabetes mellitus without complication ([place]) 99/37/1696  . Neck mass 09/08/2013  . Swimmer's ear 09/08/2013  . Erectile dysfunction 01/03/2013  . Prediabetes 01/03/2013    Salem Caster. Fairly IV, PT, DPT Physical Therapist- [facility]  06/12/2020, 1:59 PM  [place] MAIN [facility] SERVICES [address] [place], [place], [address] Phone: [phone]   Fax:  [phone]  Name: Curtis Powell MRN: [id number] Date of Birth: Oct 24, 1965

## 2020-06-14 ENCOUNTER — Ambulatory Visit: Payer: Medicare Other

## 2020-06-14 ENCOUNTER — Encounter: Payer: Self-pay | Admitting: Occupational Therapy

## 2020-06-14 ENCOUNTER — Other Ambulatory Visit: Payer: Self-pay

## 2020-06-14 ENCOUNTER — Ambulatory Visit: Payer: Medicare Other | Admitting: Occupational Therapy

## 2020-06-14 DIAGNOSIS — M6281 Muscle weakness (generalized): Secondary | ICD-10-CM

## 2020-06-14 DIAGNOSIS — R278 Other lack of coordination: Secondary | ICD-10-CM

## 2020-06-14 DIAGNOSIS — R2689 Other abnormalities of gait and mobility: Secondary | ICD-10-CM | POA: Diagnosis not present

## 2020-06-14 DIAGNOSIS — R2681 Unsteadiness on feet: Secondary | ICD-10-CM

## 2020-06-14 DIAGNOSIS — R531 Weakness: Secondary | ICD-10-CM

## 2020-06-14 NOTE — Therapy (Signed)
[place] MAIN [facility] SERVICES [address] [place], [place], [address] Phone: [phone]   Fax:  [phone]  Occupational Therapy Treatment  Patient Details  Name: Curtis Powell MRN: [id number] Date of Birth: 1966-02-09 No data recorded  Encounter Date: 06/14/2020   OT End of Session - 06/14/20 1359    Visit Number [id number]    Number of Visits 258    Date for OT Re-Evaluation 06/28/20    Authorization Type Progress reporting period starting 03/27/2020    OT Start Time 1346    OT Stop Time 1439    OT Time Calculation (min) 53 min    Equipment Utilized During Treatment 3.5# dowel, 4# DB    Activity Tolerance Patient tolerated treatment well    Behavior During Therapy [facility] for tasks assessed/performed           Past Medical History:  Diagnosis Date  . GERD (gastroesophageal reflux disease)   . Hyperlipidemia   . Hypertension   . Obesity   . Stroke [facility])     Past Surgical History:  Procedure Laterality Date  . BRAIN SURGERY      There were no vitals filed for this visit.   Subjective Assessment - 06/14/20 1335    Subjective  Pt reports he is excited to go home and lay down after all the therapy exercise.    Patient is accompanied by: --    Pertinent History Pt. is a 55 y.o. male who suffered a CVA on 05/01/2016. Pt. was admitted to the hospital. Once discharged, he received Home Health PT and OT services for about a month. Pt. has had multiple CVAs over the past 8 years, and has had multiple falls in the past 6 months. Pt. resides in an apartment. Pt. has caregivers for 80 hours. Pt.'s mother stays with pt. at night, and assists with IADL tasks.    Patient Stated Goals To be able to throw a ball and dribble a ball, do as much as I can for myself.     Currently in Pain? No/denies    Pain Onset In the past 7 days                THERAPEUTIC EXERCISE Pt performed 3.5# dowel exercises for UE strengthening secondary to weakness. 1  set x 20 reps each of bilateral shoulder flexion, chest press, circular patterns, and elbow flexion/extension were completed with cues for technique and rest breaks. Pt tolerated 4# dumbbells for elbow flexion and extension,  2# for forearm supination/pronation and wrist flexion/extension.  Pt worked on grasping coins from a tabletop surface using R hand, placing them into a resistive container, and pushing them through the slot while isolating his 2nd digit. Pt required increased time for quarters/nickles. Requires cues to pinch coins from tabletop vs sliding to edge of table. Pt completed 2nd trial using L hand, increased cues to isolate 2nd digit.    SELF CARE Independent toileting at start and end of session. CGA hand washing standing sinkside - assist for posterior LOBs. MIN A don jacket seated in w/c - assist for pulling down in back, Independent doff zip up jacket. Pt completed x3 trials, continues to demonstrate difficulty with BUE overhead and behind back.               OT Education - 06/14/20 1359    Education provided Yes    Education Details UE exercise, ROM/strengthening    Person(s) Educated Patient    Methods  Explanation;Demonstration    Comprehension Verbalized understanding;Returned demonstration;Verbal cues required            OT Short Term Goals - 03/03/17 1459      OT SHORT TERM GOAL #1   Title `             OT Long Term Goals - 04/05/20 1400      OT LONG TERM GOAL #1   Title Pt. will UE functioning to be able to perfrom toilet hygiene with minimal assistance.    Baseline 01/14/2018 - Pt completes toileting hygiene independently. Pt requires assistance managing LB clothing.    Time 12    Period Weeks    Status Achieved      OT LONG TERM GOAL #2   Title Pt. will complete self grooming with minA    Baseline 02/24/2018 - Pt is independent with oral care and washing his face and occasionally requires increased time if he is having a bad day. Pt's  shaving is completed by his barber.    Time 12    Period Weeks    Status Achieved      OT LONG TERM GOAL #3   Title Pt. will perform self-feeding skills with modified indepndence    Baseline 01/14/2018 - Pt is able to complete self-feeding independently with minimal spillage. Pt is unable to cut meat at this time.    Time 12    Period Weeks    Status Achieved      OT LONG TERM GOAL #4   Title Pt. will perform self dressing with minA and A/E as needed.    Baseline Pt. requires [place] LE dressing. Pt. has difficulty with LE the right LE secondary to AFO. Pt. continues to be able to donn a T shirt independently with increased time. MinA donning a jacket, Mod A donning pants over his feet, and socks, minA with shoes.    Time 12    Period Weeks    Status On-going    Target Date 06/27/20      OT LONG TERM GOAL #5   Title Pt. will perform light home making tasks with minA    Baseline Pt is able to wash dishes while seated in w/c with increased accessibility of new apartment. Pt. is able to fold his clothes, his moms puts them in the and out of the washer, and dryer, and puts them onto hangers. Caregivers perform vacuuming, and dusting.    Time 12    Period Weeks    Status Achieved      OT LONG TERM GOAL #6   Title Pt. will write his name efficiently with 100% legibility    Baseline Pt. continues to present with 755 legibility with printed form, and 50% legibility with cursive form. Pt. continues to present with 75% legibility with increased time required when printing his name, and 50% legibility when cursive writing his name to sign in when riding the [place]    Time 12    Period Weeks    Status On-going    Target Date 06/27/20      OT LONG TERM GOAL #7   Title Pt will independently and consistently follow HEP to increase UE strength to increase functional independence    Baseline Pt. continues to work towards independence with exercises    Time 12    Period Weeks    Status On-going     Target Date 06/27/20      OT LONG TERM GOAL #8  Title Pt. will require supervision ironing a shirt.    Baseline Pt. has progressed CGA, and complete set-up seated using a tabletop iron    Time 12    Period Weeks    Status Partially Met    Target Date 06/27/20      OT LONG TERM GOAL  #9   Baseline Pt. will independently be able to sew a button onto a shirt.    Time 12    Period Weeks    Status On-going    Target Date 06/27/20      OT LONG TERM GOAL  #10   TITLE Pt. will independently be able to throw a ball    Baseline Pt. continues to be limited with throwing overhand, however pt. is able to perform underhand throwing.    Time 12    Period Weeks    Status On-going    Target Date 06/27/20      OT LONG TERM GOAL  #11   TITLE Pt. will improve UE functional reaching to be able to independently use his RUE to hand his clothes in the closest.    Baseline Pt. uses a reacher to complete. Pt. continues to improve with reaching up with the RUE, however has difficulty reaching the hanger rack in the closet. Pt. now uses a reacher.    Time 12    Period Weeks    Status On-going    Target Date 06/27/20      OT LONG TERM GOAL  #12   TITLE Pt. will  be independent with shaving using an electric razer.    Baseline Pt. is able to use the electric razer independently, however is unable to access the position of the plug and requires assist form caregivers secondary to the size of the bathroom, and location of the plug.    Time 12    Period Weeks    Status Achieved      OT LONG TERM GOAL  #13   TITLE Pt. will increase BUE strength to be able to prepare for assisting with yardwork.    Baseline Pt. BUE strength conitnues to be 4+/5 Pt. reports that he has been able to get out to the front.    Time 12    Period Weeks    Status On-going      OT LONG TERM GOAL  #14   TITLE Pt. will improve activity toelrance in standing to be able to complete standing ADLs, and IADLs with SBA.    Baseline  Pt. is limited with ADL performance in standing.    Time 12    Period Weeks    Status On-going    Target Date 06/27/20                 Plan - 06/14/20 1400    Clinical Impression Statement Pt continues to work towards improving strength, ROM and coordination skills. Pt reports he works out at home doing exerciese from PT/OT. Continues to be limited in overhead motions and standing ADLs. Continue OT to increase independence in necessary daily tasks.    OT Occupational Profile and History Problem Focused Assessment - Including review of records relating to presenting problem    Occupational performance deficits (Please refer to evaluation for details): ADL's;IADL's    Body Structure / Function / Physical Skills ADL;FMC;ROM;IADL;Pain;Coordination    Rehab Potential Fair    Clinical Decision Making Several treatment options, min-mod task modification necessary    Comorbidities Affecting Occupational Performance: May have  comorbidities impacting occupational performance    Modification or Assistance to Complete Evaluation  Min-Moderate modification of tasks or assist with assess necessary to complete eval    OT Frequency 1x / week    OT Duration 12 weeks    OT Treatment/Interventions Self-care/ADL training;Patient/family education;DME and/or AE instruction;Therapeutic exercise;Neuromuscular education    Consulted and Agree with Plan of Care Patient           Patient will benefit from skilled therapeutic intervention in order to improve the following deficits and impairments:   Body Structure / Function / Physical Skills: ADL,FMC,ROM,IADL,Pain,Coordination       Visit Diagnosis: Muscle weakness (generalized)  Other lack of coordination  Left-sided weakness    Problem List Patient Active Problem List   Diagnosis Date Noted  . Impaired glucose tolerance 03/23/2020  . Learning disability 03/23/2020  . Urinary and fecal incontinence 06/14/2019  . Chronic pain syndrome  03/23/2019  . Constipation 03/23/2019  . Dysarthria 03/23/2019  . Dysphagia 03/23/2019  . Learning difficulty 03/23/2019  . Morbid obesity ([place]) 03/23/2019  . Muscle weakness 03/23/2019  . Vitamin D deficiency 03/23/2019  . Bilateral carotid artery stenosis 12/15/2018  . Moderate aortic valve stenosis 12/15/2018  . BMI 45.0-49.9, adult ([place]) 11/10/2018  . Cerebrovascular accident ([place]) 05/02/2016  . Hemiparesis affecting right side as late effect of cerebrovascular accident ([place]) 05/01/2016  . Brain tumor ([place]) 11/24/2014  . Gastro-esophageal reflux disease without esophagitis 11/24/2014  . Accident due to mechanical fall without injury 08/03/2014  . Essential hypertension 06/13/2014  . Mixed hyperlipidemia 06/13/2014  . Unspecified sequelae of cerebral infarction 06/13/2014  . 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/2014  . Type 2 diabetes mellitus without complication ([place]) 77/02/6578  . Neck mass 09/08/2013  . Swimmer's ear 09/08/2013  . Erectile dysfunction 01/03/2013  . Prediabetes 01/03/2013    Dessie Coma, M.S. OTR/L  06/14/20, 2:40 PM  ascom [phone]  [place] MAIN [facility] SERVICES [address] [place], [place], [address] Phone: [phone]   Fax:  [phone]  Name: Curtis Powell MRN: [id number] Date of Birth: [birth year]

## 2020-06-14 NOTE — Therapy (Signed)
[place] MAIN [facility] SERVICES [address] [place], [place], [address] Phone: [phone]   Fax:  [phone]  Physical Therapy Treatment  Patient Details  Name: Curtis Powell MRN: [id number] Date of Birth: June 10, 1965 No data recorded  Encounter Date: 06/14/2020   PT End of Session - 06/14/20 1355    Visit Number [id number]    Number of Visits 284    Date for PT Re-Evaluation 07/03/20    Authorization Type 2/10  PN 04/10/20    PT Start Time 1305    PT Stop Time 1345    PT Time Calculation (min) 40 min    Equipment Utilized During Treatment Gait belt    Activity Tolerance Patient tolerated treatment well    Behavior During Therapy [facility] for tasks assessed/performed           Past Medical History:  Diagnosis Date  . GERD (gastroesophageal reflux disease)   . Hyperlipidemia   . Hypertension   . Obesity   . Stroke [facility])     Past Surgical History:  Procedure Laterality Date  . BRAIN SURGERY      There were no vitals filed for this visit.  Treatment:  Pt bathroom break at start of session  Standing weight-shifts laterally at RW - 10x each way  Standing marches 3x12, CGA; pt reports exercise is challenging  GTB hamstring curls - 2x12 BLEs; VC/TC for technique  GTB hip abduction - 2x15 BLE; VC/TC for technique, pt rates exercise "medium"  STS 6x throughout session with VC/TC for technique, particularly with eccentric control with stand>sit.   Neuromuscular Re-Education  Standing balance at RW 4x30 sec, CGA;  Brief rest break between sets   Education provided throughout regarding exercise technique and sequencing.  Added to HEP Access Code: [email or web address] URL: [email or web address] Date: 06/14/2020 Prepared by: Ricard Dillon  Exercises Standing March with Counter Support - 1 x daily - 4 x weekly - 3 sets - 12 reps Seated Hamstring Curl with Anchored Resistance - 1 x daily - 4 x weekly - 3 sets - 12 reps Seated Hip  Abduction with Resistance - 1 x daily - 4 x weekly - 3 sets - 15 reps  Assessment: Focused on weight-shifts in standing through standing marches and lateral shifting, with emphasis on increasing weightbearing through RLE and maintaining standing balance. Pt still has difficulty with eccentric control with stand>sit, requiring frequent cues for technique with descent. Pt provided with handout for HEP.    PT Short Term Goals - 05/23/20 4132      PT SHORT TERM GOAL #1   Title Patient will be independent in home exercise program to improve strength/mobility for better functional independence with ADLs.    Baseline hep compliant    Time 2    Period Weeks    Status Achieved      PT SHORT TERM GOAL #2   Title Patient will require min cueing for STS transfer with CGA for increased independence with mobility.     Baseline CGA    Time 2    Period Weeks    Status Achieved      PT SHORT TERM GOAL #3   Title Patient will maintain upright posture for > 15 seconds to demonstrate strengthened postural control muscualture     Baseline 18 seconds     Time 2    Period Weeks    Status Achieved             PT Long Term  Goals - 05/23/20 0001      PT LONG TERM GOAL #1   Title Patient will perform bed mobility (rolling and supine <>sit) with Mod I  to increase mobility and  perform ADLs.    Baseline 9/13: able to perform with mod I occasional Min A when very fatigued 7/14: Min A 4/21: unable to perform 6/15: improving but still min-mod A  11/24: min-modA, difficulty breathing with changing positions 1/3: able to perform without assistance    Time 12    Period Weeks    Status Achieved      PT LONG TERM GOAL #2   Title Patient (< 21 years old) will complete five times sit to stand test in < 10 seconds indicating an increased LE strength and improved balance.    Baseline 9/13: 14 seconds with one near LOB 7/14: 14.31 seconds 6/21: 16.86 seconds 4/21: 18.8 seconds 06/02/19: 19.93 seconds one hand on  RW, one hand on w/c 12/30: 16 secpmds BUE support 02/11/19: 36 seconds 5/19: 20.6s from [facility], one hand on walker and one on WC, 80% upright posture 7/1: 22 seconds one hand on walker one hand on w/c  9/14: 48 seconds with full upright posture 11/24: 25 seconds with full upright posture, one hand on 2WW 1/3: 18 seocnds with one near LOB; 05/22/2020 18 seconds use of UEs to assist    Time 12    Period Weeks    Status Partially Met    Target Date 08/14/20      PT LONG TERM GOAL #3   Title Patient will negotiate a bathroom with a walker and Min A for increased indep in home and external environment    Baseline 9/13: able to some in standing, requires sitting down to wash hands 7/14: unable to do  11/24: able to complete voiding in standing and requires sitting down to wash hands 1/3: able to complete toileting in standing but needs to sit to wash hands; 05/22/2020 Pt still using WC to wash hands    Time 12    Period Weeks    Status Partially Met    Target Date 08/14/20      PT LONG TERM GOAL #4   Title Patient will increase BLE gross strength to 4+/5 as to improve functional strength for independent gait, increased standing tolerance and increased ADL ability.    Baseline 4/21: see note 06/02/19: hip flexion 4-/5, extension/abd 3/5 12/30: hip flexion 4/5 hip extension and abduction 3/5 02/11/19: hip flexion 4/5 hip extension and abduction 2+/54-/5 RLE, 10/15: 4/5 RLE 11/7: 4/5 RLE 3/3: 4/5 7/1:  hip flexion 4/5 hip extension 2/5 hip abduction/adduciton 2/5; 05/22/2020 hip flexion 4+/5 B;  abduction/adduction 5/5 both B; hip extension 3/5 B    Time 12    Period Weeks    Status Partially Met    Target Date 08/14/20      PT LONG TERM GOAL #5   Title Patient will ambulate 250 ft with least assistive device and Supervision with no breaks to allow for increased mobility within home.    Baseline 1/3: 200 ft no breaks    Time 12    Period Weeks    Status On-going    Target Date 08/14/20      PT LONG TERM  GOAL #6   Title Patient will ambulate 200 ft with least assistive device and Supervision with no breaks to allow for increased mobility within home.    Baseline 9/13: performed in previous session 7/14: 190  ft with RW 4/21: ambulate 150 ft 6/21: had to terminate after 90 ft due to cardiac  11/24: level surface with 2WW for 65 ft, limited due to fatigue and not feeling good today    Time 12    Period Weeks    Status Achieved    Target Date 04/11/19      PT LONG TERM GOAL #7   Title Patient will perform 10 MWT in <30 seconds for improved gait speed, gait mechanics, and functional capacity for mobility.    Baseline 10/11: 48s with BRW and WC follow; 9/13: terminated due to wardrobe malfunction 9/8: 42 seconds with R foot collapse 7/14: 37.98 seconds 6/21: 46.9 seconds with RW 07/28/19: 51 seconds with RW 06/02/19: 62.5 seconds with RW 11/4: 56 seconds with AFO and RW 12/30: 53 seconds with AFO and RW  04/10/20: 42 seconds with AFO and RW; 05/22/2020 0.2 m/s with RW, WC-follow, CGA    Time 12    Period Weeks    Status Partially Met    Target Date 08/14/20      PT LONG TERM GOAL #8   Title Patient will increase Berg Balance score by > 6 points (38/56) to demonstrate decreased fall risk during functional activities.    Baseline 9/13: 31/56 7/14: 33/56 07/28/19: 32/56 6/21: 32/56 04/10/20: 33/56; 05/22/2020 BERG 28/56, impacted performance due to fatigue    Time 12    Period Weeks    Status Partially Met    Target Date 08/14/20                 Plan - 06/14/20 1320    Clinical Impression Statement Focused on weight-shifts in standing through standing marches and lateral shifting, with emphasis on increasing weightbearing through RLE and maintaining standing balance. Pt still has difficulty with eccentric control with stand>sit, requiring frequent cues for technique with descent. Pt provided with handout for HEP.    Rehab Potential Poor    Clinical Impairments Affecting Rehab Potential hx of HTN,  HLD, CVA, learning disability, Diabetes, brain tumor,     PT Frequency 2x / week    PT Duration 12 weeks    PT Treatment/Interventions ADLs/Self Care Home Management;Aquatic Therapy;Ultrasound;Moist Heat;Traction;DME Instruction;Gait training;Stair training;Functional mobility training;Therapeutic activities;Therapeutic exercise;Orthotic Fit/Training;Neuromuscular re-education;Balance training;Patient/family education;Manual techniques;Wheelchair mobility training;Passive range of motion;Energy conservation;Taping;Visual/perceptual remediation/compensation    PT Next Visit Plan standing balance tasks; ambulation with emphasis on equal weightbearing through LEs.    PT Home Exercise Plan TrA contraction, glute sets: needs updates. Potentially some time in supine flat in hospital bed, to improve hip extension deficits. Standing hip extension exercise; added seated marches to HEP 05/29/2020 and provided handout    Consulted and Agree with Plan of Care Patient           Patient will benefit from skilled therapeutic intervention in order to improve the following deficits and impairments:  Abnormal gait,Decreased activity tolerance,Decreased balance,Decreased knowledge of precautions,Decreased endurance,Decreased coordination,Decreased knowledge of use of DME,Decreased mobility,Decreased range of motion,Difficulty walking,Decreased safety awareness,Decreased strength,Impaired flexibility,Impaired perceived functional ability,Impaired tone,Postural dysfunction,Improper body mechanics,Pain  Visit Diagnosis: Other abnormalities of gait and mobility  Muscle weakness (generalized)  Unsteadiness on feet     Problem List Patient Active Problem List   Diagnosis Date Noted  . Impaired glucose tolerance 03/23/2020  . Learning disability 03/23/2020  . Urinary and fecal incontinence 06/14/2019  . Chronic pain syndrome 03/23/2019  . Constipation 03/23/2019  . Dysarthria 03/23/2019  . Dysphagia 03/23/2019   . Learning difficulty 03/23/2019  .  Morbid obesity ([place]) 03/23/2019  . Muscle weakness 03/23/2019  . Vitamin D deficiency 03/23/2019  . Bilateral carotid artery stenosis 12/15/2018  . Moderate aortic valve stenosis 12/15/2018  . BMI 45.0-49.9, adult ([place]) 11/10/2018  . Cerebrovascular accident ([place]) 05/02/2016  . Hemiparesis affecting right side as late effect of cerebrovascular accident ([place]) 05/01/2016  . Brain tumor ([place]) 11/24/2014  . Gastro-esophageal reflux disease without esophagitis 11/24/2014  . Accident due to mechanical fall without injury 08/03/2014  . Essential hypertension 06/13/2014  . Mixed hyperlipidemia 06/13/2014  . Unspecified sequelae of cerebral infarction 06/13/2014  . 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/2014  . Type 2 diabetes mellitus without complication ([place]) 83/38/2505  . Neck mass 09/08/2013  . Swimmer's ear 09/08/2013  . Erectile dysfunction 01/03/2013  . Prediabetes 01/03/2013   Ricard Dillon PT, DPT 06/14/2020, 1:58 PM  [place] MAIN [facility] SERVICES [address] [place], [place], [address] Phone: [phone]   Fax:  [phone]  Name: Curtis Powell MRN: [id number] Date of Birth: May 03, 1965

## 2020-06-19 ENCOUNTER — Other Ambulatory Visit: Payer: Self-pay

## 2020-06-19 ENCOUNTER — Ambulatory Visit: Payer: Medicare Other

## 2020-06-19 DIAGNOSIS — R2689 Other abnormalities of gait and mobility: Secondary | ICD-10-CM | POA: Diagnosis not present

## 2020-06-19 DIAGNOSIS — R278 Other lack of coordination: Secondary | ICD-10-CM

## 2020-06-19 DIAGNOSIS — M6281 Muscle weakness (generalized): Secondary | ICD-10-CM

## 2020-06-19 NOTE — Therapy (Signed)
[place] [facility] MAIN REHAB SERVICES [address] [place], [place], [address] Phone: [phone]   Fax:  [phone]  Physical Therapy Treatment  Patient Details  Name: Curtis Powell MRN: [id number] Date of Birth: 12/14/1965 No data recorded  Encounter Date: 06/19/2020   PT End of Session - 06/19/20 1623    Visit Number [id number]    Number of Visits 284    Date for PT Re-Evaluation 07/03/20    Authorization Type 2/10  PN 04/10/20    PT Start Time 1315    PT Stop Time 1344    PT Time Calculation (min) 29 min    Equipment Utilized During Treatment Gait belt    Activity Tolerance Patient tolerated treatment well    Behavior During Therapy WFL for tasks assessed/performed           Past Medical History:  Diagnosis Date  . GERD (gastroesophageal reflux disease)   . Hyperlipidemia   . Hypertension   . Obesity   . Stroke (HCC)     Past Surgical History:  Procedure Laterality Date  . BRAIN SURGERY      There were no vitals filed for this visit.   Subjective Assessment - 06/19/20 1311    Subjective Pt reports no pain today. Pt reports doing HEP. Pt says he has been walking at home with RW when caregiver present to help.    Pertinent History  Patient is a pleasant 55 year old male who presents to physical therapy for weakness and immobility secondary to CVA.  Had a stroke in Feb 2018. He was previously fully independent, but this stroke caused severe residual deficits, mainly on the right side as well as speech, and now he is unable to walk or perform most of his ADLs on his own. Entire right side is very weak. He still has a little difficulty with speech but his swallowing is improved to baseline. His mom had to move in with him and now is his main caregiver.     Limitations Reading    How long can you sit comfortably? 5 minutes    How long can you stand comfortably? 5 minutes    How long can you walk comfortably? 8 minutes    Patient Stated Goals  Walk without walker, walk further with walker, get some strength in back     Currently in Pain? No/denies           TREATMENT  Ambulation with RW, WC-follow for LE muscular endurance and functional capacity/gait ability. Pt ambulated 1 full lap (138 ft) before requiring seated rest break. Pt then ambulated 49 ft followed by a brief seated rest break, and then ambulated another 78 ft.  He continues to require VC for proximity to walker, upright posture, and to decrease excessive pronation of R foot. Pt rates ambulation exercise "medium" difficulty today.  Standing marches at RW - 2x12, CGA provided. VC and demonstration for technique.      PT Short Term Goals - 05/23/20 0842      PT SHORT TERM GOAL #1   Title Patient will be independent in home exercise program to improve strength/mobility for better functional independence with ADLs.    Baseline hep compliant    Time 2    Period Weeks    Status Achieved      PT SHORT TERM GOAL #2   Title Patient will require min cueing for STS transfer with CGA for increased independence with mobility.       Baseline CGA    Time 2    Period Weeks    Status Achieved      PT SHORT TERM GOAL #3   Title Patient will maintain upright posture for > 15 seconds to demonstrate strengthened postural control muscualture     Baseline 18 seconds     Time 2    Period Weeks    Status Achieved             PT Long Term Goals - 05/23/20 0001      PT LONG TERM GOAL #1   Title Patient will perform bed mobility (rolling and supine <>sit) with Mod I  to increase mobility and  perform ADLs.    Baseline 9/13: able to perform with mod I occasional Min A when very fatigued 7/14: Min A 4/21: unable to perform 6/15: improving but still min-mod A  11/24: min-modA, difficulty breathing with changing positions 1/3: able to perform without assistance    Time 12    Period Weeks    Status Achieved      PT LONG TERM GOAL #2   Title Patient (< 60 years old) will  complete five times sit to stand test in < 10 seconds indicating an increased LE strength and improved balance.    Baseline 9/13: 14 seconds with one near LOB 7/14: 14.31 seconds 6/21: 16.86 seconds 4/21: 18.8 seconds 06/02/19: 19.93 seconds one hand on RW, one hand on w/c 12/30: 16 secpmds BUE support 02/11/19: 36 seconds 5/19: 20.6s from WC, one hand on walker and one on WC, 80% upright posture 7/1: 22 seconds one hand on walker one hand on w/c  9/14: 48 seconds with full upright posture 11/24: 25 seconds with full upright posture, one hand on 2WW 1/3: 18 seocnds with one near LOB; 05/22/2020 18 seconds use of UEs to assist    Time 12    Period Weeks    Status Partially Met    Target Date 08/14/20      PT LONG TERM GOAL #3   Title Patient will negotiate a bathroom with a walker and Min A for increased indep in home and external environment    Baseline 9/13: able to some in standing, requires sitting down to wash hands 7/14: unable to do  11/24: able to complete voiding in standing and requires sitting down to wash hands 1/3: able to complete toileting in standing but needs to sit to wash hands; 05/22/2020 Pt still using WC to wash hands    Time 12    Period Weeks    Status Partially Met    Target Date 08/14/20      PT LONG TERM GOAL #4   Title Patient will increase BLE gross strength to 4+/5 as to improve functional strength for independent gait, increased standing tolerance and increased ADL ability.    Baseline 4/21: see note 06/02/19: hip flexion 4-/5, extension/abd 3/5 12/30: hip flexion 4/5 hip extension and abduction 3/5 02/11/19: hip flexion 4/5 hip extension and abduction 2+/54-/5 RLE, 10/15: 4/5 RLE 11/7: 4/5 RLE 3/3: 4/5 7/1:  hip flexion 4/5 hip extension 2/5 hip abduction/adduciton 2/5; 05/22/2020 hip flexion 4+/5 B;  abduction/adduction 5/5 both B; hip extension 3/5 B    Time 12    Period Weeks    Status Partially Met    Target Date 08/14/20      PT LONG TERM GOAL #5   Title Patient  will ambulate 250 ft with least assistive device and Supervision with no   breaks to allow for increased mobility within home.    Baseline 1/3: 200 ft no breaks    Time 12    Period Weeks    Status On-going    Target Date 08/14/20      PT LONG TERM GOAL #6   Title Patient will ambulate 200 ft with least assistive device and Supervision with no breaks to allow for increased mobility within home.    Baseline 9/13: performed in previous session 7/14: 190 ft with RW 4/21: ambulate 150 ft 6/21: had to terminate after 90 ft due to cardiac  11/24: level surface with 2WW for 65 ft, limited due to fatigue and not feeling good today    Time 12    Period Weeks    Status Achieved    Target Date 04/11/19      PT LONG TERM GOAL #7   Title Patient will perform 10 MWT in <30 seconds for improved gait speed, gait mechanics, and functional capacity for mobility.    Baseline 10/11: 48s with BRW and WC follow; 9/13: terminated due to wardrobe malfunction 9/8: 42 seconds with R foot collapse 7/14: 37.98 seconds 6/21: 46.9 seconds with RW 07/28/19: 51 seconds with RW 06/02/19: 62.5 seconds with RW 11/4: 56 seconds with AFO and RW 12/30: 53 seconds with AFO and RW  04/10/20: 42 seconds with AFO and RW; 05/22/2020 0.2 m/s with RW, WC-follow, CGA    Time 12    Period Weeks    Status Partially Met    Target Date 08/14/20      PT LONG TERM GOAL #8   Title Patient will increase Berg Balance score by > 6 points (38/56) to demonstrate decreased fall risk during functional activities.    Baseline 9/13: 31/56 7/14: 33/56 07/28/19: 32/56 6/21: 32/56 04/10/20: 33/56; 05/22/2020 BERG 28/56, impacted performance due to fatigue    Time 12    Period Weeks    Status Partially Met    Target Date 08/14/20            Plan - 06/19/20 1624    Clinical Impression Statement Session significantly limited today secondary to pt arrival time and pt requiring bathroom break at beginning of appointment. Pt exhibits improvement compared to  previous sessions by ambulating for 138 ft before requiring a seated rest break, indicating improved gait ability and LE muscle endurance. Although pt demonstrates improvement, he did require 2 brief seated rest breaks for remaining lap (total amb. 265 ft). Pt will continue to benefit from further skilled therapy to improve BLE strength, endurance and gait ability to improve ease with ADLs.    Rehab Potential Poor    Clinical Impairments Affecting Rehab Potential hx of HTN, HLD, CVA, learning disability, Diabetes, brain tumor,     PT Frequency 2x / week    PT Duration 12 weeks    PT Treatment/Interventions ADLs/Self Care Home Management;Aquatic Therapy;Ultrasound;Moist Heat;Traction;DME Instruction;Gait training;Stair training;Functional mobility training;Therapeutic activities;Therapeutic exercise;Orthotic Fit/Training;Neuromuscular re-education;Balance training;Patient/family education;Manual techniques;Wheelchair mobility training;Passive range of motion;Energy conservation;Taping;Visual/perceptual remediation/compensation    PT Next Visit Plan Static balance exercises    PT Home Exercise Plan TrA contraction, glute sets: needs updates. Potentially some time in supine flat in hospital bed, to improve hip extension deficits. Standing hip extension exercise; added seated marches to HEP 05/29/2020 and provided handout    Consulted and Agree with Plan of Care Patient           Patient will benefit from skilled therapeutic intervention in order to improve the  following deficits and impairments:  Abnormal gait,Decreased activity tolerance,Decreased balance,Decreased knowledge of precautions,Decreased endurance,Decreased coordination,Decreased knowledge of use of DME,Decreased mobility,Decreased range of motion,Difficulty walking,Decreased safety awareness,Decreased strength,Impaired flexibility,Impaired perceived functional ability,Impaired tone,Postural dysfunction,Improper body mechanics,Pain  Visit  Diagnosis: Muscle weakness (generalized)  Other abnormalities of gait and mobility  Other lack of coordination     Problem List Patient Active Problem List   Diagnosis Date Noted  . Impaired glucose tolerance 03/23/2020  . Learning disability 03/23/2020  . Urinary and fecal incontinence 06/14/2019  . Chronic pain syndrome 03/23/2019  . Constipation 03/23/2019  . Dysarthria 03/23/2019  . Dysphagia 03/23/2019  . Learning difficulty 03/23/2019  . Morbid obesity (HCC) 03/23/2019  . Muscle weakness 03/23/2019  . Vitamin D deficiency 03/23/2019  . Bilateral carotid artery stenosis 12/15/2018  . Moderate aortic valve stenosis 12/15/2018  . BMI 45.0-49.9, adult ([facility]) 11/10/2018  . Cerebrovascular accident ([facility]CC) 05/02/2016  . Hemiparesis affecting right side as late effect of cerebrovascular accident (HCC) 05/01/2016  . Brain tumor ([facility]CC) 11/24/2014  . Gastro-esophageal reflux disease without esophagitis 11/24/2014  . Accident due to mechanical fall without injury 08/03/2014  . Essential hypertension 06/13/2014  . Mixed hyperlipidemia 06/13/2014  . Unspecified sequelae of cerebral infarction 06/13/2014  . 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/2014  . Type 2 diabetes mellitus without complication (HCC) 01/06/2014  . Neck mass 09/08/2013  . Swimmer's ear 09/08/2013  . Erectile dysfunction 01/03/2013  . Prediabetes 01/03/2013   Haley Barak PT, DPT 06/19/2020, 4:34 PM  [place] [facility] MAIN REHAB SERVICES [address] [place], [place], [address] Phone: [phone]   Fax:  [phone]  Name: Deone L Bergland MRN: [id number] Date of Birth: 01/21/1966   

## 2020-06-21 ENCOUNTER — Ambulatory Visit: Payer: Medicare Other | Admitting: Occupational Therapy

## 2020-06-21 ENCOUNTER — Ambulatory Visit: Payer: Medicare Other

## 2020-06-21 ENCOUNTER — Encounter: Payer: Self-pay | Admitting: Occupational Therapy

## 2020-06-21 ENCOUNTER — Other Ambulatory Visit: Payer: Self-pay

## 2020-06-21 DIAGNOSIS — M6281 Muscle weakness (generalized): Secondary | ICD-10-CM

## 2020-06-21 DIAGNOSIS — R2689 Other abnormalities of gait and mobility: Secondary | ICD-10-CM | POA: Diagnosis not present

## 2020-06-21 DIAGNOSIS — R2681 Unsteadiness on feet: Secondary | ICD-10-CM

## 2020-06-21 NOTE — Therapy (Signed)
[place] MAIN [facility] SERVICES [address] [place], [place], [address] Phone: [phone]   Fax:  [phone]  Physical Therapy Treatment  Patient Details  Name: Curtis Powell MRN: [id number] Date of Birth: May 03, 1965 No data recorded  Encounter Date: 06/21/2020   PT End of Session - 06/21/20 1546    Visit Number [id number]    Number of Visits 284    Date for PT Re-Evaluation 07/03/20    Authorization Type 2/10  PN 04/10/20    PT Start Time 1356    PT Stop Time 1430    PT Time Calculation (min) 34 min    Equipment Utilized During Treatment Gait belt    Activity Tolerance Patient tolerated treatment well    Behavior During Therapy WFL for tasks assessed/performed           Past Medical History:  Diagnosis Date  . GERD (gastroesophageal reflux disease)   . Hyperlipidemia   . Hypertension   . Obesity   . Stroke [facility])     Past Surgical History:  Procedure Laterality Date  . BRAIN SURGERY      There were no vitals filed for this visit.   Subjective Assessment - 06/21/20 1400    Subjective Pt reports no pain. Pt reports doing HEP, but that he has not attempted standing marches since previous session. No other changes noted.    Pertinent History  Patient is a pleasant 55 year old male who presents to physical therapy for weakness and immobility secondary to CVA.  Had a stroke in Feb 2018. He was previously fully independent, but this stroke caused severe residual deficits, mainly on the right side as well as speech, and now he is unable to walk or perform most of his ADLs on his own. Entire right side is very weak. He still has a little difficulty with speech but his swallowing is improved to baseline. His mom had to move in with him and now is his main caregiver.     Limitations Reading    How long can you sit comfortably? 5 minutes    How long can you stand comfortably? 5 minutes    How long can you walk comfortably? 8 minutes    Patient  Stated Goals Walk without walker, walk further with walker, get some strength in back     Currently in Pain? No/denies         TREATMENT  Neuro RE-ed - CGA to min assist x1 provided for the following:  Standing balance with no UE support 4x30 sec   Standing balance NBOS 2x30 sec, up to min assist from PT d/t multiple LOB posteriorly   Standing reach with cone stacking at RW, UUE support, up to min assist x several minutes due to pt LOB posteriorly on multiple instances; pt rates exercise "medium"  Standing balance with cone staking with UUE support, pt having to twist at trunk and reach across midline to grab and stack cones; 2x each side LOB posteriorly 3x with min assist x1 for descent into WC   Seated marches with 2.5# AW 1x25, 4# 2x12; pt rates exercise "medium"  Seated LAQ with 4# AW 3x10; pt rates "medium"    PT Short Term Goals - 05/23/20 1324      PT SHORT TERM GOAL #1   Title Patient will be independent in home exercise program to improve strength/mobility for better functional independence with ADLs.    Baseline hep compliant    Time  2    Period Weeks    Status Achieved      PT SHORT TERM GOAL #2   Title Patient will require min cueing for STS transfer with CGA for increased independence with mobility.     Baseline CGA    Time 2    Period Weeks    Status Achieved      PT SHORT TERM GOAL #3   Title Patient will maintain upright posture for > 15 seconds to demonstrate strengthened postural control muscualture     Baseline 18 seconds     Time 2    Period Weeks    Status Achieved             PT Long Term Goals - 05/23/20 0001      PT LONG TERM GOAL #1   Title Patient will perform bed mobility (rolling and supine <>sit) with Mod I  to increase mobility and  perform ADLs.    Baseline 9/13: able to perform with mod I occasional Min A when very fatigued 7/14: Min A 4/21: unable to perform 6/15: improving but still min-mod A  11/24: min-modA, difficulty  breathing with changing positions 1/3: able to perform without assistance    Time 12    Period Weeks    Status Achieved      PT LONG TERM GOAL #2   Title Patient (< 28 years old) will complete five times sit to stand test in < 10 seconds indicating an increased LE strength and improved balance.    Baseline 9/13: 14 seconds with one near LOB 7/14: 14.31 seconds 6/21: 16.86 seconds 4/21: 18.8 seconds 06/02/19: 19.93 seconds one hand on RW, one hand on w/c 12/30: 16 secpmds BUE support 02/11/19: 36 seconds 5/19: 20.6s from [facility], one hand on walker and one on WC, 80% upright posture 7/1: 22 seconds one hand on walker one hand on w/c  9/14: 48 seconds with full upright posture 11/24: 25 seconds with full upright posture, one hand on 2WW 1/3: 18 seocnds with one near LOB; 05/22/2020 18 seconds use of UEs to assist    Time 12    Period Weeks    Status Partially Met    Target Date 08/14/20      PT LONG TERM GOAL #3   Title Patient will negotiate a bathroom with a walker and Min A for increased indep in home and external environment    Baseline 9/13: able to some in standing, requires sitting down to wash hands 7/14: unable to do  11/24: able to complete voiding in standing and requires sitting down to wash hands 1/3: able to complete toileting in standing but needs to sit to wash hands; 05/22/2020 Pt still using WC to wash hands    Time 12    Period Weeks    Status Partially Met    Target Date 08/14/20      PT LONG TERM GOAL #4   Title Patient will increase BLE gross strength to 4+/5 as to improve functional strength for independent gait, increased standing tolerance and increased ADL ability.    Baseline 4/21: see note 06/02/19: hip flexion 4-/5, extension/abd 3/5 12/30: hip flexion 4/5 hip extension and abduction 3/5 02/11/19: hip flexion 4/5 hip extension and abduction 2+/54-/5 RLE, 10/15: 4/5 RLE 11/7: 4/5 RLE 3/3: 4/5 7/1:  hip flexion 4/5 hip extension 2/5 hip abduction/adduciton 2/5; 05/22/2020 hip  flexion 4+/5 B;  abduction/adduction 5/5 both B; hip extension 3/5 B    Time 12  Period Weeks    Status Partially Met    Target Date 08/14/20      PT LONG TERM GOAL #5   Title Patient will ambulate 250 ft with least assistive device and Supervision with no breaks to allow for increased mobility within home.    Baseline 1/3: 200 ft no breaks    Time 12    Period Weeks    Status On-going    Target Date 08/14/20      PT LONG TERM GOAL #6   Title Patient will ambulate 200 ft with least assistive device and Supervision with no breaks to allow for increased mobility within home.    Baseline 9/13: performed in previous session 7/14: 190 ft with RW 4/21: ambulate 150 ft 6/21: had to terminate after 90 ft due to cardiac  11/24: level surface with 2WW for 65 ft, limited due to fatigue and not feeling good today    Time 12    Period Weeks    Status Achieved    Target Date 04/11/19      PT LONG TERM GOAL #7   Title Patient will perform 10 MWT in <30 seconds for improved gait speed, gait mechanics, and functional capacity for mobility.    Baseline 10/11: 48s with BRW and WC follow; 9/13: terminated due to wardrobe malfunction 9/8: 42 seconds with R foot collapse 7/14: 37.98 seconds 6/21: 46.9 seconds with RW 07/28/19: 51 seconds with RW 06/02/19: 62.5 seconds with RW 11/4: 56 seconds with AFO and RW 12/30: 53 seconds with AFO and RW  04/10/20: 42 seconds with AFO and RW; 05/22/2020 0.2 m/s with RW, WC-follow, CGA    Time 12    Period Weeks    Status Partially Met    Target Date 08/14/20      PT LONG TERM GOAL #8   Title Patient will increase Berg Balance score by > 6 points (38/56) to demonstrate decreased fall risk during functional activities.    Baseline 9/13: 31/56 7/14: 33/56 07/28/19: 32/56 6/21: 32/56 04/10/20: 33/56; 05/22/2020 BERG 28/56, impacted performance due to fatigue    Time 12    Period Weeks    Status Partially Met    Target Date 08/14/20          Patient will benefit from  skilled therapeutic intervention in order to improve the following deficits and impairments:  Abnormal gait,Decreased activity tolerance,Decreased balance,Decreased knowledge of precautions,Decreased endurance,Decreased coordination,Decreased knowledge of use of DME,Decreased mobility,Decreased range of motion,Difficulty walking,Decreased safety awareness,Decreased strength,Impaired flexibility,Impaired perceived functional ability,Impaired tone,Postural dysfunction,Improper body mechanics,Pain  Visit Diagnosis: Muscle weakness (generalized)  Unsteadiness on feet  Other abnormalities of gait and mobility     Problem List Patient Active Problem List   Diagnosis Date Noted  . Impaired glucose tolerance 03/23/2020  . Learning disability 03/23/2020  . Urinary and fecal incontinence 06/14/2019  . Chronic pain syndrome 03/23/2019  . Constipation 03/23/2019  . Dysarthria 03/23/2019  . Dysphagia 03/23/2019  . Learning difficulty 03/23/2019  . Morbid obesity ([place]) 03/23/2019  . Muscle weakness 03/23/2019  . Vitamin D deficiency 03/23/2019  . Bilateral carotid artery stenosis 12/15/2018  . Moderate aortic valve stenosis 12/15/2018  . BMI 45.0-49.9, adult ([place]) 11/10/2018  . Cerebrovascular accident ([place]) 05/02/2016  . Hemiparesis affecting right side as late effect of cerebrovascular accident ([place]) 05/01/2016  . Brain tumor ([place]) 11/24/2014  . Gastro-esophageal reflux disease without esophagitis 11/24/2014  . Accident due to mechanical fall without injury 08/03/2014  . Essential hypertension 06/13/2014  .  Mixed hyperlipidemia 06/13/2014  . Unspecified sequelae of cerebral infarction 06/13/2014  . 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/2014  . Type 2 diabetes mellitus without complication ([place]) 37/29/0211  . Neck mass 09/08/2013  . Swimmer's ear 09/08/2013  . Erectile dysfunction 01/03/2013  . Prediabetes 01/03/2013   Ricard Dillon PT, DPT 06/21/2020, 4:17 PM  [place] MAIN [facility] SERVICES [address] [place], [place], [address] Phone: [phone]   Fax:  [phone]  Name: Curtis Powell MRN: [id number] Date of Birth: September 25, 1965

## 2020-06-21 NOTE — Therapy (Signed)
[place] MAIN [facility] SERVICES [address] [place], [place], [address] Phone: [phone]   Fax:  [phone]  Occupational Therapy Progress Note/Discharge Note  Dates of reporting period  03/27/2020   to   06/21/2020  Patient Details  Name: Curtis Powell MRN: [id number] Date of Birth: [birth year] No data recorded  Encounter Date: 06/21/2020   OT End of Session - 06/21/20 1313    Visit Number [id number]    Number of Visits 258    Authorization Type Progress reporting period starting 03/27/2020    OT Start Time 1345    OT Stop Time 1430    OT Time Calculation (min) 45 min    Activity Tolerance Patient tolerated treatment well    Behavior During Therapy [facility] for tasks assessed/performed           Past Medical History:  Diagnosis Date  . GERD (gastroesophageal reflux disease)   . Hyperlipidemia   . Hypertension   . Obesity   . Stroke [facility])     Past Surgical History:  Procedure Laterality Date  . BRAIN SURGERY      There were no vitals filed for this visit.   Subjective Assessment - 06/21/20 1313    Subjective  Pt. reports that he is doing well today    Patient is accompanied by: Family member    Pertinent History Pt. is a 55 y.o. male who suffered a CVA on 05/01/2016. Pt. was admitted to the hospital. Once discharged, he received Home Health PT and OT services for about a month. Pt. has had multiple CVAs over the past 8 years, and has had multiple falls in the past 6 months. Pt. resides in an apartment. Pt. has caregivers for 80 hours. Pt.'s mother stays with pt. at night, and assists with IADL tasks.    Currently in Pain? No/denies          Goals were reviewed with the pt. Pt. has made progress overall throughout the course of this therapy since the initial evaluation. Pt. has improved with BUE ROM, strength, [place] skills. Pt. Is able to use his hands more during daily ADL, and IADL tasks. Pt. Continues to have daily caregivers assist  with basic ADLs, and meal preparation. Pt. was provided with additional HEPs for right hand strengthening for theraputty, and [facility] skills. Pt. education was provided. Pt. is now appropriate for discharge from OT services at this time.                      OT Education - 06/21/20 1313    Education provided Yes    Education Details UE exercise, ROM/strengthening    Person(s) Educated Patient    Methods Explanation;Demonstration    Comprehension Verbalized understanding;Returned demonstration;Verbal cues required            OT Short Term Goals - 03/03/17 1459      OT SHORT TERM GOAL #1   Title `             OT Long Term Goals - 06/21/20 1300      OT LONG TERM GOAL #1   Title Pt. will UE functioning to be able to perfrom toilet hygiene with minimal assistance.    Baseline 01/14/2018 - Pt completes toileting hygiene independently. Pt requires assistance managing LB clothing.    Time 12    Period Weeks    Status Achieved      OT LONG TERM GOAL #2  Title Pt. will complete self grooming with minA    Baseline 02/24/2018 - Pt is independent with oral care and washing his face and occasionally requires increased time if he is having a bad day. Pt's shaving is completed by his barber.    Time 12    Period Weeks    Status Achieved      OT LONG TERM GOAL #3   Title Pt. will perform self-feeding skills with modified indepndence    Baseline 01/14/2018 - Pt is able to complete self-feeding independently with minimal spillage. Pt is unable to cut meat at this time.    Time 12    Period Weeks    Status Achieved      OT LONG TERM GOAL #4   Title Pt. will perform self dressing with minA and A/E as needed.    Baseline Pt. requires [place] LE dressing. Pt. has difficulty with LE the right LE secondary to AFO. Pt. continues to be able to donn a T shirt independently with increased time. MinA donning a jacket, Mod A donning pants over his feet, and socks, minA with shoes.     Time 12    Period Weeks    Status Partially Met      OT LONG TERM GOAL #5   Title Pt. will perform light home making tasks with minA    Baseline Pt is able to wash dishes while seated in w/c with increased accessibility of new apartment. Pt. is able to fold his clothes, his moms puts them in the and out of the washer, and dryer, and puts them onto hangers. Caregivers perform vacuuming, and dusting.    Time 12    Period Weeks    Status Achieved      OT LONG TERM GOAL #6   Title Pt. will write his name efficiently with 100% legibility    Baseline Pt. continues to present with 75% legibility with printed form, and 50% legibility with cursive form. Pt. continues to present with 75% legibility with increased time required when printing his name, and 50% legibility when cursive writing his name to sign in when riding the [place]    Time 12    Period Weeks    Status Partially Met    Target Date 06/27/20      OT LONG TERM GOAL #7   Title Pt will independently and consistently follow HEP to increase UE strength to increase functional independence    Baseline Pt. continues to work towards independence with exercises    Time 12    Period Weeks    Status Achieved    Target Date 06/27/20      OT LONG TERM GOAL #8   Title Pt. will require supervision ironing a shirt.    Baseline Pt. has progressed CGA, and complete set-up seated using a tabletop iron    Time 12    Period Weeks    Status Partially Met    Target Date 06/27/20      OT LONG TERM GOAL  #9   Baseline Pt. will independently be able to sew a button onto a shirt.    Time 12    Period Weeks    Status On-going    Target Date 06/27/20      OT LONG TERM GOAL  #10   TITLE Pt. will independently be able to throw a ball    Baseline Pt. continues to be limited with throwing overhand, however pt. is able to perform underhand throwing.  Time 12    Period Weeks    Status Not Met    Target Date 06/27/20      OT LONG TERM GOAL  #11    TITLE Pt. will improve UE functional reaching to be able to independently use his RUE to hand his clothes in the closest.    Baseline Pt. uses a reacher to complete. Pt. continues to improve with reaching up with the RUE, however has difficulty reaching the hanger rack in the closet. Pt. now uses a reacher.    Time 12    Period Weeks    Status On-going      OT LONG TERM GOAL  #12   TITLE Pt. will  be independent with shaving using an electric razer.    Baseline Pt. is able to use the electric razer independently, however is unable to access the position of the plug and requires assist form caregivers secondary to the size of the bathroom, and location of the plug.    Time 12    Period Weeks    Status Achieved      OT LONG TERM GOAL  #13   TITLE Pt. will increase BUE strength to be able to prepare for assisting with yardwork.    Baseline Pt. BUE strength continues to be 5/5 Pt. reports that he has been able to get out to the front.    Time 12    Period Weeks    Status On-going      OT LONG TERM GOAL  #14   TITLE Pt. will improve activity [place]rance in standing to be able to complete standing ADLs, and IADLs with SBA.    Baseline Pt. is limited with ADL performance in standing.    Time 12    Period Weeks    Status Partially Met    Target Date 06/27/20                 Plan - 06/21/20 1314    Clinical Impression Statement Goals were reviewed with the pt. Pt. has made progress overall throughout the course of this therapy since the initial evaluation. Pt. Has improved with BUE ROM, strength, [place] skills. Pt. Is able to use his hands more during daily ADL, and IADL tasks. Pt. Continues to have daily caregivers assist with basic ADLs, and meal preparation. Pt. was provided with additional HEPs for right hand strengthening for theraputty, and [facility] skills. Pt. education was provided. Pt. is now appropriate for discharge from OT services at this time.   OT Occupational Profile and History  Problem Focused Assessment - Including review of records relating to presenting problem    Occupational performance deficits (Please refer to evaluation for details): ADL's;IADL's    Body Structure / Function / Physical Skills ADL;FMC;ROM;IADL;Pain;Coordination    Rehab Potential Fair    Clinical Decision Making Several treatment options, min-mod task modification necessary    Comorbidities Affecting Occupational Performance: May have comorbidities impacting occupational performance    Modification or Assistance to Complete Evaluation  Min-Moderate modification of tasks or assist with assess necessary to complete eval    OT Frequency 1x / week    OT Duration 12 weeks    OT Treatment/Interventions Self-care/ADL training;Patient/family education;DME and/or AE instruction;Therapeutic exercise;Neuromuscular education    Plan continue with current OT plan of care. Pt would like to continue to get stronger and be more independent.    Consulted and Agree with Plan of Care Patient  Patient will benefit from skilled therapeutic intervention in order to improve the following deficits and impairments:   Body Structure / Function / Physical Skills: ADL,FMC,ROM,IADL,Pain,Coordination       Visit Diagnosis: Muscle weakness (generalized)    Problem List Patient Active Problem List   Diagnosis Date Noted  . Impaired glucose tolerance 03/23/2020  . Learning disability 03/23/2020  . Urinary and fecal incontinence 06/14/2019  . Chronic pain syndrome 03/23/2019  . Constipation 03/23/2019  . Dysarthria 03/23/2019  . Dysphagia 03/23/2019  . Learning difficulty 03/23/2019  . Morbid obesity ([place]) 03/23/2019  . Muscle weakness 03/23/2019  . Vitamin D deficiency 03/23/2019  . Bilateral carotid artery stenosis 12/15/2018  . Moderate aortic valve stenosis 12/15/2018  . BMI 45.0-49.9, adult ([place]) 11/10/2018  . Cerebrovascular accident ([place]) 05/02/2016  . Hemiparesis affecting right side as  late effect of cerebrovascular accident ([place]) 05/01/2016  . Brain tumor ([place]) 11/24/2014  . Gastro-esophageal reflux disease without esophagitis 11/24/2014  . Accident due to mechanical fall without injury 08/03/2014  . Essential hypertension 06/13/2014  . Mixed hyperlipidemia 06/13/2014  . Unspecified sequelae of cerebral infarction 06/13/2014  . 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/2014  . Type 2 diabetes mellitus without complication ([place]) 40/98/1191  . Neck mass 09/08/2013  . Swimmer's ear 09/08/2013  . Erectile dysfunction 01/03/2013  . Prediabetes 01/03/2013    Harrel Carina, MS, OTR/L 06/21/2020, 6:02 PM  [place] MAIN [facility] SERVICES [address] [place], [place], [address] Phone: [phone]   Fax:  [phone]  Name: Curtis Powell MRN: [id number] Date of Birth: August 02, 1965

## 2020-06-26 ENCOUNTER — Other Ambulatory Visit: Payer: Self-pay

## 2020-06-26 ENCOUNTER — Ambulatory Visit: Payer: Medicare Other

## 2020-06-26 DIAGNOSIS — R2689 Other abnormalities of gait and mobility: Secondary | ICD-10-CM | POA: Diagnosis not present

## 2020-06-26 DIAGNOSIS — R2681 Unsteadiness on feet: Secondary | ICD-10-CM

## 2020-06-26 DIAGNOSIS — M6281 Muscle weakness (generalized): Secondary | ICD-10-CM

## 2020-06-26 NOTE — Therapy (Signed)
[name] MAIN [facility] SERVICES [address] [place], [place], [address] Phone: [phone]   Fax:  [phone]  Physical Therapy Treatment Physical Therapy Progress Note   Dates of reporting period  05/22/2020   to   06/26/2020   Patient Details  Name: Curtis Powell MRN: [id number] Date of Birth: [birth year] No data recorded  Encounter Date: 06/26/2020   PT End of Session - 06/26/20 1705    Visit Number [id number]    Number of Visits 284    Date for PT Re-Evaluation 07/03/20    Authorization Type 2/10  PN 04/10/20    PT Start Time 1301    PT Stop Time 1346    PT Time Calculation (min) 45 min    Equipment Utilized During Treatment Gait belt    Activity Tolerance Patient tolerated treatment well    Behavior During Therapy [facility] for tasks assessed/performed           Past Medical History:  Diagnosis Date  . GERD (gastroesophageal reflux disease)   . Hyperlipidemia   . Hypertension   . Obesity   . Stroke [facility])     Past Surgical History:  Procedure Laterality Date  . BRAIN SURGERY      There were no vitals filed for this visit.   Subjective Assessment - 06/26/20 1704    Subjective Pt reports he has been walking at home with caregiver. He says that he stands at home to use sink after going to the bathroom. Pt reports no pain today.    Pertinent History  Patient is a pleasant 55 year old male who presents to physical therapy for weakness and immobility secondary to CVA.  Had a stroke in Feb 2018. He was previously fully independent, but this stroke caused severe residual deficits, mainly on the right side as well as speech, and now he is unable to walk or perform most of his ADLs on his own. Entire right side is very weak. He still has a little difficulty with speech but his swallowing is improved to baseline. His mom had to move in with him and now is his main caregiver.     Limitations Reading    How long can you sit comfortably? 5 minutes     How long can you stand comfortably? 5 minutes    How long can you walk comfortably? 8 minutes    Patient Stated Goals Walk without walker, walk further with walker, get some strength in back     Currently in Pain? No/denies         TREATMENT - review of goals/tests   Mobility in bathroom/ability to perform ADLs: pt negotiates bathroom with W, performs STS and uses sink for UE support to wash hands with supervision-CGA 1x   MMT: Grossly 4+/5 (goal met) Hip flexion: 5/5 B Hip abduction 5/5 B Hip 4+/5 B Knee flexion 5/5 B Knee Ext 5/5 B Ankle dorsiflexion L 5/5 Ankle plantarflexion L 5/5 Hip extension 4+/5 B  10MWT: 0.15 m/s with RW (decreased)  Amb for distance - 52 ft with RW, CGA, WC-follow before requiring seated rest break  5xSTS: 23 sec with use of UEs to assist to stand to RW, CGA.  BERG: deferred d/t time   Assessment: Pt goals re-tested this session. Pt achieved MMT goal indicating increase of gross LE strength to 4+/5. Pt shows improved ability to navigate ADLs in bathroom with less assist. Pt is able to perform handwashing standing at sink with WC  behind him and UE support on sink with supervision-CGA. Following these assessments pt performed 10MWT, 5xSTS and ambulation for distance. Pt with decreases in scores on all 3, possibly impacted by fatigue with other testing. However, this indicates impairments in BLE power, gait ability and BLE endurance. BERG deferred to next session d/t time. Pt 5xSTS and ambulation to be reassessed next session in addition to BERG to determine if pt performance impacted by fatigue or if pt is plateauing. Pt will continue to benefit from further skilled therapy to further assess and improve balance, gait ability, endurance, and LE power in order to increase independence with ADLs.   Patient's condition has the potential to improve in response to therapy. Maximum improvement is yet to be obtained. The anticipated improvement is attainable and  reasonable in a generally predictable time.  Patient reports he is washing his hands at home by standing at sink.      PT Education - 06/26/20 1705    Education provided Yes    Education Details Pt educated on reassessment/test findings, indications for POC    Person(s) Educated Patient    Methods Explanation    Comprehension Verbalized understanding            PT Short Term Goals - 06/26/20 1709      PT SHORT TERM GOAL #1   Title Patient will be independent in home exercise program to improve strength/mobility for better functional independence with ADLs.    Baseline hep compliant    Time 2    Period Weeks    Status Achieved      PT SHORT TERM GOAL #2   Title Patient will require min cueing for STS transfer with CGA for increased independence with mobility.     Baseline CGA    Time 2    Period Weeks    Status Achieved      PT SHORT TERM GOAL #3   Title Patient will maintain upright posture for > 15 seconds to demonstrate strengthened postural control muscualture     Baseline 18 seconds     Time 2    Period Weeks    Status Achieved             PT Long Term Goals - 06/26/20 0001      PT LONG TERM GOAL #1   Title Patient will perform bed mobility (rolling and supine <>sit) with Mod I  to increase mobility and  perform ADLs.    Baseline 9/13: able to perform with mod I occasional Min A when very fatigued 7/14: Min A 4/21: unable to perform 6/15: improving but still min-mod A  11/24: min-modA, difficulty breathing with changing positions 1/3: able to perform without assistance    Time 12    Period Weeks    Status Achieved      PT LONG TERM GOAL #2   Title Patient (< 26 years old) will complete five times sit to stand test in < 10 seconds indicating an increased LE strength and improved balance.    Baseline 9/13: 14 seconds with one near LOB 7/14: 14.31 seconds 6/21: 16.86 seconds 4/21: 18.8 seconds 06/02/19: 19.93 seconds one hand on RW, one hand on w/c 12/30: 16  secpmds BUE support 02/11/19: 36 seconds 5/19: 20.6s from [place], one hand on walker and one on WC, 80% upright posture 7/1: 22 seconds one hand on walker one hand on w/c  9/14: 48 seconds with full upright posture 11/24: 25 seconds with full upright posture,  one hand on 2WW 1/3: 18 seocnds with one near LOB; 05/22/2020 18 seconds use of UEs to assist; 06/26/2020 23 sec    Time 12    Period Weeks    Status Partially Met    Target Date 08/14/20      PT LONG TERM GOAL #3   Title Patient will negotiate a bathroom with a walker and Min A for increased indep in home and external environment    Baseline 9/13: able to some in standing, requires sitting down to wash hands 7/14: unable to do  11/24: able to complete voiding in standing and requires sitting down to wash hands 1/3: able to complete toileting in standing but needs to sit to wash hands; 05/22/2020 Pt still using WC to wash hands; 3/21 pt uses WC and then performs STS with UE support on sink to perform handwashing with supervision-CGA    Time 12    Period Weeks    Status Partially Met    Target Date 08/14/20      PT LONG TERM GOAL #4   Title Patient will increase BLE gross strength to 4+/5 as to improve functional strength for independent gait, increased standing tolerance and increased ADL ability.    Baseline 4/21: see note 06/02/19: hip flexion 4-/5, extension/abd 3/5 12/30: hip flexion 4/5 hip extension and abduction 3/5 02/11/19: hip flexion 4/5 hip extension and abduction 2+/54-/5 RLE, 10/15: 4/5 RLE 11/7: 4/5 RLE 3/3: 4/5 7/1:  hip flexion 4/5 hip extension 2/5 hip abduction/adduciton 2/5; 05/22/2020 hip flexion 4+/5 B;  abduction/adduction 5/5 both B; hip extension 3/5 B; LE strength grossly 4+/5 B    Time 12    Period Weeks    Status Achieved    Target Date 08/14/20      PT LONG TERM GOAL #5   Title Patient will ambulate 250 ft with least assistive device and Supervision with no breaks to allow for increased mobility within home.    Baseline  1/3: 200 ft no breaks; Pt ambulates with RW, WC-follow, 06/26/2020 CGA for 52 ft before requiring break (pt in recent past ambulates >100 ft)    Time 12    Period Weeks    Status On-going    Target Date 08/14/20      PT LONG TERM GOAL #6   Title Patient will ambulate 200 ft with least assistive device and Supervision with no breaks to allow for increased mobility within home.    Baseline 9/13: performed in previous session 7/14: 190 ft with RW 4/21: ambulate 150 ft 6/21: had to terminate after 90 ft due to cardiac  11/24: level surface with 2WW for 65 ft, limited due to fatigue and not feeling good today    Time 12    Period Weeks    Status Achieved    Target Date 04/11/19      PT LONG TERM GOAL #7   Title Patient will perform 10 MWT in <30 seconds for improved gait speed, gait mechanics, and functional capacity for mobility.    Baseline 10/11: 48s with BRW and WC follow; 9/13: terminated due to wardrobe malfunction 9/8: 42 seconds with R foot collapse 7/14: 37.98 seconds 6/21: 46.9 seconds with RW 07/28/19: 51 seconds with RW 06/02/19: 62.5 seconds with RW 11/4: 56 seconds with AFO and RW 12/30: 53 seconds with AFO and RW  04/10/20: 42 seconds with AFO and RW; 05/22/2020 0.2 m/s with RW, WC-follow, CGA; 3/21/ 63 sec (0.15 m/s) with RW    Time  12    Period Weeks    Status Partially Met    Target Date 08/14/20      PT LONG TERM GOAL #8   Title Patient will increase Berg Balance score by > 6 points (38/56) to demonstrate decreased fall risk during functional activities.    Baseline 9/13: 31/56 7/14: 33/56 07/28/19: 32/56 6/21: 32/56 04/10/20: 33/56; 05/22/2020 BERG 28/56, impacted performance due to fatigue; 06/26/2020 deferred    Time 12    Period Weeks    Status Partially Met    Target Date 08/14/20                 Plan - 06/26/20 1724    Clinical Impression Statement Pt goals re-tested this session. Pt achieved MMT goal indicating increase of gross LE strength to 4+/5. Pt shows improved  ability to navigate ADLs in bathroom with less assist. Pt is able to perform handwashing standing at sink with WC behind him and UE support on sink with supervision-CGA. Following these assessments pt performed 10MWT, 5xSTS and ambulation for distance. Pt with decreases in scores on all 3, possibly impacted by fatigue with other testing. However, this indicates impairments in BLE power, gait ability and BLE endurance. BERG deferred to next session d/t time. Pt 5xSTS and ambulation to be reassessed next session in addition to BERG to determine if pt performance impacted by fatigue or if pt is plateauing. Pt will continue to benefit from further skilled therapy to further assess and improve balance, gait ability, endurance, and LE power in order to increase independence with ADLs.    Rehab Potential Poor    Clinical Impairments Affecting Rehab Potential hx of HTN, HLD, CVA, learning disability, Diabetes, brain tumor,     PT Frequency 2x / week    PT Duration 12 weeks    PT Treatment/Interventions ADLs/Self Care Home Management;Aquatic Therapy;Ultrasound;Moist Heat;Traction;DME Instruction;Gait training;Stair training;Functional mobility training;Therapeutic activities;Therapeutic exercise;Orthotic Fit/Training;Neuromuscular re-education;Balance training;Patient/family education;Manual techniques;Wheelchair mobility training;Passive range of motion;Energy conservation;Taping;Visual/perceptual remediation/compensation    PT Next Visit Plan Static balance exercises; balance exercises with crossing midline; Berg, 5xSTS and amb. for distance    PT Home Exercise Plan TrA contraction, glute sets: needs updates. Potentially some time in supine flat in hospital bed, to improve hip extension deficits. Standing hip extension exercise; added seated marches to HEP 05/29/2020 and provided handout    Consulted and Agree with Plan of Care Patient           Patient will benefit from skilled therapeutic intervention in  order to improve the following deficits and impairments:  Abnormal gait,Decreased activity tolerance,Decreased balance,Decreased knowledge of precautions,Decreased endurance,Decreased coordination,Decreased knowledge of use of DME,Decreased mobility,Decreased range of motion,Difficulty walking,Decreased safety awareness,Decreased strength,Impaired flexibility,Impaired perceived functional ability,Impaired tone,Postural dysfunction,Improper body mechanics,Pain  Visit Diagnosis: Muscle weakness (generalized)  Unsteadiness on feet  Other abnormalities of gait and mobility     Problem List Patient Active Problem List   Diagnosis Date Noted  . Impaired glucose tolerance 03/23/2020  . Learning disability 03/23/2020  . Urinary and fecal incontinence 06/14/2019  . Chronic pain syndrome 03/23/2019  . Constipation 03/23/2019  . Dysarthria 03/23/2019  . Dysphagia 03/23/2019  . Learning difficulty 03/23/2019  . Morbid obesity ([place]) 03/23/2019  . Muscle weakness 03/23/2019  . Vitamin D deficiency 03/23/2019  . Bilateral carotid artery stenosis 12/15/2018  . Moderate aortic valve stenosis 12/15/2018  . BMI 45.0-49.9, adult ([place]) 11/10/2018  . Cerebrovascular accident ([place]) 05/02/2016  . Hemiparesis affecting right side as late effect of cerebrovascular  accident ([place]) 05/01/2016  . Brain tumor ([place]) 11/24/2014  . Gastro-esophageal reflux disease without esophagitis 11/24/2014  . Accident due to mechanical fall without injury 08/03/2014  . Essential hypertension 06/13/2014  . Mixed hyperlipidemia 06/13/2014  . Unspecified sequelae of cerebral infarction 06/13/2014  . 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/2014  . Type 2 diabetes mellitus without complication ([place]) 16/24/4695  . Neck mass 09/08/2013  . Swimmer's ear 09/08/2013  . Erectile dysfunction 01/03/2013  . Prediabetes 01/03/2013   Ricard Dillon PT, DPT 06/26/2020, 5:26 PM  [place] MAIN [facility] SERVICES [address] [place], [place], [address] Phone: [phone]   Fax:  [phone]  Name: Curtis Powell MRN: [id number] Date of Birth: 1966/01/21

## 2020-06-28 ENCOUNTER — Other Ambulatory Visit: Payer: Self-pay

## 2020-06-28 ENCOUNTER — Ambulatory Visit: Payer: Medicare Other | Admitting: Occupational Therapy

## 2020-06-28 ENCOUNTER — Ambulatory Visit: Payer: Medicare Other

## 2020-06-28 DIAGNOSIS — R278 Other lack of coordination: Secondary | ICD-10-CM

## 2020-06-28 DIAGNOSIS — R2689 Other abnormalities of gait and mobility: Secondary | ICD-10-CM

## 2020-06-28 DIAGNOSIS — R2681 Unsteadiness on feet: Secondary | ICD-10-CM

## 2020-06-28 DIAGNOSIS — M6281 Muscle weakness (generalized): Secondary | ICD-10-CM

## 2020-06-28 NOTE — Therapy (Signed)
[place] MAIN [facility] SERVICES [address] [place], [place], [address] Phone: [phone]   Fax:  [phone]  Physical Therapy Treatment/DISCHARGE SUMMARY  Patient Details  Name: Curtis Powell MRN: [id number] Date of Birth: 1965/07/30 No data recorded  Encounter Date: 06/28/2020   PT End of Session - 06/28/20 1252    Visit Number [id number]    Number of Visits 284    Date for PT Re-Evaluation 07/03/20    Authorization Type 2/10  PN 04/10/20    PT Start Time 1300    PT Stop Time 1356    PT Time Calculation (min) 56 min    Equipment Utilized During Treatment Gait belt    Activity Tolerance Patient tolerated treatment well    Behavior During Therapy [facility] for tasks assessed/performed           Past Medical History:  Diagnosis Date  . GERD (gastroesophageal reflux disease)   . Hyperlipidemia   . Hypertension   . Obesity   . Stroke [facility])     Past Surgical History:  Procedure Laterality Date  . BRAIN SURGERY      There were no vitals filed for this visit.   Subjective Assessment - 06/28/20 1258    Subjective Pt reports no pain. Pt reports walking at home with his caregiver. Pt says he feels better today.    Pertinent History  Patient is a pleasant 55 year old male who presents to physical therapy for weakness and immobility secondary to CVA.  Had a stroke in Feb 2018. He was previously fully independent, but this stroke caused severe residual deficits, mainly on the right side as well as speech, and now he is unable to walk or perform most of his ADLs on his own. Entire right side is very weak. He still has a little difficulty with speech but his swallowing is improved to baseline. His mom had to move in with him and now is his main caregiver.     Limitations Reading    How long can you sit comfortably? 5 minutes    How long can you stand comfortably? 5 minutes    How long can you walk comfortably? 8 minutes    Patient Stated Goals Walk  without walker, walk further with walker, get some strength in back     Currently in Pain? No/denies         TREATMENT - finishing review of goals from pervious session  Berg: 31/56 (slight decrease from previous 32/56) Rest break  Ambulation for distance: 91 ft Rest break  5xSTS: 21.5 sec  HEP provided: PT instructed pt in each of the following exercises for 10 reps each via demonstration/VC/TC  Access Code: [email or web address] URL: [email or web address] Date: 06/28/2020 Prepared by: Ricard Curtis  Exercises Sit to Stand with Counter Support - 1 x daily - 4 x weekly - 3 sets - 12 reps Seated March - 1 x daily - 4 x weekly - 3 sets - 12 reps Seated Long Arc Quad - 1 x daily - 4 x weekly - 3 sets - 12 reps Seated Hamstring Curls with Resistance - 1 x daily - 4 x weekly - 3 sets - 12 reps Seated Hip Adduction Isometrics with Ball - 1 x daily - 4 x weekly - 3 sets - 12 reps Seated Heel Raise - 1 x daily - 4 x weekly - 3 sets - 12 reps Heel Toe Raises with Counter Support - 1 x daily - 4 x  weekly - 3 sets - 12 reps  Additional:  Exercises Seated Hip Abduction with Resistance - 1 x daily - 4 x weekly - 3 sets - 12 reps  Pt provided education throughout regarding indications of outcomes/test scores for therapy and current functional status. Pt also instructed in performance of HEP including frequency, sets, reps, progression, and how to safely perform these exercises and ambulation at home with caregiver via use of safety belt, assist, use of RW, and WC-follow. Pt verbalized understanding.    PT Education - 06/28/20 1413    Education provided Yes    Education Details Pt further educated on assessment findings, indicatings for POC/discharge and HEP to maintain functional status provided as handout today (see note)    Person(s) Educated Patient    Methods Explanation;Handout;Verbal cues    Comprehension Verbalized understanding            PT Short Term Goals - 06/26/20 1709       PT SHORT TERM GOAL #1   Title Patient will be independent in home exercise program to improve strength/mobility for better functional independence with ADLs.    Baseline hep compliant    Time 2    Period Weeks    Status Achieved      PT SHORT TERM GOAL #2   Title Patient will require min cueing for STS transfer with CGA for increased independence with mobility.     Baseline CGA    Time 2    Period Weeks    Status Achieved      PT SHORT TERM GOAL #3   Title Patient will maintain upright posture for > 15 seconds to demonstrate strengthened postural control muscualture     Baseline 18 seconds     Time 2    Period Weeks    Status Achieved             PT Long Term Goals - 06/28/20 0001      PT LONG TERM GOAL #1   Title Patient will perform bed mobility (rolling and supine <>sit) with Mod I  to increase mobility and  perform ADLs.    Baseline 9/13: able to perform with mod I occasional Min A when very fatigued 7/14: Min A 4/21: unable to perform 6/15: improving but still min-mod A  11/24: min-modA, difficulty breathing with changing positions 1/3: able to perform without assistance    Time 12    Period Weeks    Status Achieved      PT LONG TERM GOAL #2   Title Patient (< 72 years old) will complete five times sit to stand test in < 10 seconds indicating an increased LE strength and improved balance.    Baseline 9/13: 14 seconds with one near LOB 7/14: 14.31 seconds 6/21: 16.86 seconds 4/21: 18.8 seconds 06/02/19: 19.93 seconds one hand on RW, one hand on w/c 12/30: 16 secpmds BUE support 02/11/19: 36 seconds 5/19: 20.6s from [facility], one hand on walker and one on WC, 80% upright posture 7/1: 22 seconds one hand on walker one hand on w/c  9/14: 48 seconds with full upright posture 11/24: 25 seconds with full upright posture, one hand on 2WW 1/3: 18 seocnds with one near LOB; 05/22/2020 18 seconds use of UEs to assist; 06/26/2020 23 sec; 06/28/2020 21.5 sec    Time 12    Period Weeks     Status Partially Met    Target Date 08/14/20      PT LONG TERM GOAL #3  Title Patient will negotiate a bathroom with a walker and Min A for increased indep in home and external environment    Baseline 9/13: able to some in standing, requires sitting down to wash hands 7/14: unable to do  11/24: able to complete voiding in standing and requires sitting down to wash hands 1/3: able to complete toileting in standing but needs to sit to wash hands; 05/22/2020 Pt still using WC to wash hands; 3/21 pt uses WC and then performs STS with UE support on sink to perform handwashing with supervision-CGA    Time 12    Period Weeks    Status Partially Met    Target Date 08/14/20      PT LONG TERM GOAL #4   Title Patient will increase BLE gross strength to 4+/5 as to improve functional strength for independent gait, increased standing tolerance and increased ADL ability.    Baseline 4/21: see note 06/02/19: hip flexion 4-/5, extension/abd 3/5 12/30: hip flexion 4/5 hip extension and abduction 3/5 02/11/19: hip flexion 4/5 hip extension and abduction 2+/54-/5 RLE, 10/15: 4/5 RLE 11/7: 4/5 RLE 3/3: 4/5 7/1:  hip flexion 4/5 hip extension 2/5 hip abduction/adduciton 2/5; 05/22/2020 hip flexion 4+/5 B;  abduction/adduction 5/5 both B; hip extension 3/5 B; LE strength grossly 4+/5 B    Time 12    Period Weeks    Status Achieved    Target Date 08/14/20      PT LONG TERM GOAL #5   Title Patient will ambulate 250 ft with least assistive device and Supervision with no breaks to allow for increased mobility within home.    Baseline 1/3: 200 ft no breaks; Pt ambulates with RW, WC-follow, 06/26/2020 CGA for 52 ft before requiring break (pt in recent past ambulates >100 ft); 06/28/2020 92 ft, RW, WC-follow, CGA    Time 12    Period Weeks    Status On-going    Target Date 08/14/20      PT LONG TERM GOAL #6   Title Patient will ambulate 200 ft with least assistive device and Supervision with no breaks to allow for  increased mobility within home.    Baseline 9/13: performed in previous session 7/14: 190 ft with RW 4/21: ambulate 150 ft 6/21: had to terminate after 90 ft due to cardiac  11/24: level surface with 2WW for 65 ft, limited due to fatigue and not feeling good today    Time 12    Period Weeks    Status Achieved    Target Date 04/11/19      PT LONG TERM GOAL #7   Title Patient will perform 10 MWT in <30 seconds for improved gait speed, gait mechanics, and functional capacity for mobility.    Baseline 10/11: 48s with BRW and WC follow; 9/13: terminated due to wardrobe malfunction 9/8: 42 seconds with R foot collapse 7/14: 37.98 seconds 6/21: 46.9 seconds with RW 07/28/19: 51 seconds with RW 06/02/19: 62.5 seconds with RW 11/4: 56 seconds with AFO and RW 12/30: 53 seconds with AFO and RW  04/10/20: 42 seconds with AFO and RW; 05/22/2020 0.2 m/s with RW, WC-follow, CGA; 3/21/ 63 sec (0.15 m/s) with RW    Time 12    Period Weeks    Status Partially Met    Target Date 08/14/20      PT LONG TERM GOAL #8   Title Patient will increase Berg Balance score by > 6 points (38/56) to demonstrate decreased fall risk during functional activities.  Baseline 9/13: 31/56 7/14: 33/56 07/28/19: 32/56 6/21: 32/56 04/10/20: 33/56; 05/22/2020 BERG 28/56, impacted performance due to fatigue; 06/26/2020 deferred; 3/32/2022 31/56    Time 12    Period Weeks    Status Partially Met    Target Date 08/14/20                 Plan - 06/28/20 1416    Clinical Impression Statement PT session focused on further testing/retesting of goals today. Pt repeated 5xSTS and completed it in 21.5 seconds with use of BUEs to assist, showing a slight improvement from testing on Monday. However, pt 5xSTS score still decreased from previous progress note. Similarly for ambulating for distance, pt ambulated 91 ft today, but still shows a decrease from prior assessment. Pt Berg as well with slight decrease to 31/56 compared to prevoius score of  32/56 (taken on 05/29/2020). These findings suggest maximum possible potential in therapy has likely been achieved. Remainder of session focused on issuing and reviewing comprehensive HEP for pt to maintain current functional mobility. Pt educated on following-up with his physician to ask about PT in future should he see functional decline despite performing HEP. Pt does not require further skilled PT at this time.    Rehab Potential Poor    Clinical Impairments Affecting Rehab Potential hx of HTN, HLD, CVA, learning disability, Diabetes, brain tumor,     PT Frequency 2x / week    PT Duration 12 weeks    PT Treatment/Interventions ADLs/Self Care Home Management;Aquatic Therapy;Ultrasound;Moist Heat;Traction;DME Instruction;Gait training;Stair training;Functional mobility training;Therapeutic activities;Therapeutic exercise;Orthotic Fit/Training;Neuromuscular re-education;Balance training;Patient/family education;Manual techniques;Wheelchair mobility training;Passive range of motion;Energy conservation;Taping;Visual/perceptual remediation/compensation    PT Next Visit Plan Static balance exercises; balance exercises with crossing midline; Berg, 5xSTS and amb. for distance    PT Home Exercise Plan TrA contraction, glute sets: needs updates. Potentially some time in supine flat in hospital bed, to improve hip extension deficits. Standing hip extension exercise; added seated marches to HEP 05/29/2020 and provided handout    Consulted and Agree with Plan of Care Patient           Patient will benefit from skilled therapeutic intervention in order to improve the following deficits and impairments:  Abnormal gait,Decreased activity tolerance,Decreased balance,Decreased knowledge of precautions,Decreased endurance,Decreased coordination,Decreased knowledge of use of DME,Decreased mobility,Decreased range of motion,Difficulty walking,Decreased safety awareness,Decreased strength,Impaired flexibility,Impaired  perceived functional ability,Impaired tone,Postural dysfunction,Improper body mechanics,Pain  Visit Diagnosis: Muscle weakness (generalized)  Unsteadiness on feet  Other abnormalities of gait and mobility  Other lack of coordination     Problem List Patient Active Problem List   Diagnosis Date Noted  . Impaired glucose tolerance 03/23/2020  . Learning disability 03/23/2020  . Urinary and fecal incontinence 06/14/2019  . Chronic pain syndrome 03/23/2019  . Constipation 03/23/2019  . Dysarthria 03/23/2019  . Dysphagia 03/23/2019  . Learning difficulty 03/23/2019  . Morbid obesity ([place]) 03/23/2019  . Muscle weakness 03/23/2019  . Vitamin D deficiency 03/23/2019  . Bilateral carotid artery stenosis 12/15/2018  . Moderate aortic valve stenosis 12/15/2018  . BMI 45.0-49.9, adult ([place]) 11/10/2018  . Cerebrovascular accident ([place]) 05/02/2016  . Hemiparesis affecting right side as late effect of cerebrovascular accident ([place]) 05/01/2016  . Brain tumor ([place]) 11/24/2014  . Gastro-esophageal reflux disease without esophagitis 11/24/2014  . Accident due to mechanical fall without injury 08/03/2014  . Essential hypertension 06/13/2014  . Mixed hyperlipidemia 06/13/2014  . Unspecified sequelae of cerebral infarction 06/13/2014  . 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/2014  . Type 2 diabetes mellitus without  complication ([place]) 58/30/9407  . Neck mass 09/08/2013  . Swimmer's ear 09/08/2013  . Erectile dysfunction 01/03/2013  . Prediabetes 01/03/2013   Ricard Curtis PT, DPT 06/28/2020, 2:55 PM  [place] MAIN [facility] SERVICES [address] [place], [place], [address] Phone: [phone]   Fax:  [phone]  Name: Curtis Powell MRN: [id number] Date of Birth: Jun 12, 1965

## 2020-07-03 ENCOUNTER — Ambulatory Visit: Payer: Medicare Other

## 2020-07-05 ENCOUNTER — Ambulatory Visit: Payer: Medicare Other

## 2020-07-05 ENCOUNTER — Encounter: Payer: Medicare Other | Admitting: Occupational Therapy

## 2020-07-10 ENCOUNTER — Ambulatory Visit: Payer: Medicare Other

## 2020-07-10 ENCOUNTER — Encounter: Payer: Medicare Other | Admitting: Occupational Therapy

## 2020-07-12 ENCOUNTER — Ambulatory Visit: Payer: Medicare Other

## 2020-07-17 ENCOUNTER — Encounter: Payer: Medicare Other | Admitting: Occupational Therapy

## 2020-07-17 ENCOUNTER — Ambulatory Visit: Payer: Medicare Other

## 2020-07-19 ENCOUNTER — Ambulatory Visit: Payer: Medicare Other

## 2020-07-24 ENCOUNTER — Encounter: Payer: Medicare Other | Admitting: Occupational Therapy

## 2020-07-24 ENCOUNTER — Ambulatory Visit: Payer: Medicare Other

## 2020-07-26 ENCOUNTER — Ambulatory Visit: Payer: Medicare Other

## 2020-07-31 ENCOUNTER — Ambulatory Visit: Payer: Medicare Other

## 2020-07-31 ENCOUNTER — Encounter: Payer: Medicare Other | Admitting: Occupational Therapy

## 2020-08-02 ENCOUNTER — Ambulatory Visit: Payer: Medicare Other

## 2020-08-07 ENCOUNTER — Ambulatory Visit: Payer: Medicare Other

## 2020-08-07 ENCOUNTER — Encounter: Payer: Medicare Other | Admitting: Occupational Therapy

## 2020-08-09 ENCOUNTER — Ambulatory Visit: Payer: Medicare Other

## 2020-08-14 ENCOUNTER — Encounter: Payer: Medicare Other | Admitting: Occupational Therapy

## 2020-08-14 ENCOUNTER — Ambulatory Visit: Payer: Medicare Other

## 2020-08-16 ENCOUNTER — Ambulatory Visit: Payer: Medicare Other

## 2020-08-21 ENCOUNTER — Encounter: Payer: Medicare Other | Admitting: Occupational Therapy

## 2020-08-21 ENCOUNTER — Ambulatory Visit: Payer: Medicare Other

## 2020-08-23 ENCOUNTER — Ambulatory Visit: Payer: Medicare Other

## 2020-08-28 ENCOUNTER — Ambulatory Visit: Payer: Medicare Other

## 2020-08-28 ENCOUNTER — Encounter: Payer: Medicare Other | Admitting: Occupational Therapy

## 2020-08-30 ENCOUNTER — Ambulatory Visit: Payer: Medicare Other

## 2020-09-06 ENCOUNTER — Encounter: Payer: Medicare Other | Admitting: Occupational Therapy

## 2020-09-06 ENCOUNTER — Ambulatory Visit: Payer: Medicare Other

## 2020-09-11 ENCOUNTER — Ambulatory Visit: Payer: Medicare Other

## 2020-09-21 ENCOUNTER — Ambulatory Visit: Payer: Medicare Other

## 2020-10-04 ENCOUNTER — Encounter: Payer: Self-pay | Admitting: Physical Therapy

## 2020-10-04 ENCOUNTER — Other Ambulatory Visit: Payer: Self-pay

## 2020-10-04 ENCOUNTER — Ambulatory Visit: Payer: Medicare Other | Attending: Family Medicine | Admitting: Physical Therapy

## 2020-10-04 DIAGNOSIS — R2681 Unsteadiness on feet: Secondary | ICD-10-CM | POA: Diagnosis present

## 2020-10-04 DIAGNOSIS — M6281 Muscle weakness (generalized): Secondary | ICD-10-CM

## 2020-10-04 DIAGNOSIS — R2689 Other abnormalities of gait and mobility: Secondary | ICD-10-CM

## 2020-10-04 NOTE — Therapy (Signed)
[place] MAIN [facility] SERVICES [address] [place], [place], [address] Phone: [phone]   Fax:  [phone]  Physical Therapy Evaluation  Patient Details  Name: Curtis Powell MRN: [id number] Date of Birth: 1965-09-06 Referring Provider (PT): Kym Groom MD   Encounter Date: 10/04/2020   PT End of Session - 10/04/20 1212     Visit Number 1    Number of Visits 17    Date for PT Re-Evaluation 11/29/20    Authorization Type start of care 10/04/20    PT Start Time 1101    PT Stop Time 1205    PT Time Calculation (min) 64 min    Equipment Utilized During Treatment Gait belt    Activity Tolerance Patient tolerated treatment well    Behavior During Therapy WFL for tasks assessed/performed             Past Medical History:  Diagnosis Date   GERD (gastroesophageal reflux disease)    Hyperlipidemia    Hypertension    Obesity    Stroke [facility])     Past Surgical History:  Procedure Laterality Date   BRAIN SURGERY      There were no vitals filed for this visit.    Subjective Assessment - 10/04/20 1104     Subjective "I want to walk better"    Pertinent History Patient is a pleasant 55 year old male who presents to physical therapy for weakness and immobility secondary to CVA.  Had a stroke in Feb 2018. He was previously fully independent, but this stroke caused severe residual deficits, mainly on the right side. He reports difficulty walking and decreased mobility. His mom had to move in with him and now is his main caregiver.  Patient had outpatient PT from November 2018-March 2022. Patient reports since being discharged from outpatient therapy he has kept up with his HEP daily. He does report desire for walking better and for getting in and out of bed easier. He reports his mom is helping with some ADLs. He reports needing help with rolling in bed. He reports his mom/aide assist with dressing. He has a home aide 3 days out of the week;     Limitations Standing;Walking    How long can you sit comfortably? NA    How long can you stand comfortably? <30 min, does minimal standing;    How long can you walk comfortably? depends on the day, fatigue affects his walking; He is walking every day, about 15-20 min;    Diagnostic tests None recent    Patient Stated Goals to walk better and be independent in bed mobility;    Currently in Pain? No/denies    Multiple Pain Sites No                [facility] PT Assessment - 10/04/20 0001       Assessment   Medical Diagnosis s/p CVA 2018/weakness    Referring Provider (PT) Olmedo MD    Onset Date/Surgical Date 05/09/16    Hand Dominance Right    Next MD Visit November 2022    Prior Therapy had significant outpatient PT from Nov 2018-March 2022 with good results      Precautions   Precautions Fall    Required Braces or Orthoses --   RLE AFO     Restrictions   Weight Bearing Restrictions No      Balance Screen   Has the patient fallen in the past 6 months No  Has the patient had a decrease in activity level because of a fear of falling?  Yes    Is the patient reluctant to leave their home because of a fear of falling?  No      Home Environment   Additional Comments Lives in single story home, has curb to negotiate; no ramps; has tub/shower with bench, elevated height commode, pt able to negotiate home with wheelchair      Prior Function   Level of Independence Requires assistive device for independence;Needs assistance with ADLs;Needs assistance with gait;Needs assistance with transfers    Vocation On disability    Leisure watch TV, watch surroundings;      Cognition   Overall Cognitive Status History of cognitive impairments - at baseline    Memory --   does have some memory issues;     Observation/Other Assessments   Focus on Therapeutic Outcomes (FOTO)  42%      Sensation   Light Touch Appears Intact      Coordination   Gross Motor Movements are Fluid and  Coordinated Yes      Posture/Postural Control   Posture Comments sits with forward flexed/rounded shoulders; Has difficulty achieving full lumbar extension with heavy flexed posture and hip flexion      ROM / Strength   AROM / PROM / Strength AROM;Strength;PROM      PROM   Overall PROM Comments has minimal tightness in right hamstring, SLR grossly 60 degrees, LLE exhibits increased tightness felt at approximately 40 degrees      Strength   Strength Assessment Site Hip;Knee;Ankle    Right/Left Hip Right;Left    Right Hip Flexion 4-/5    Right Hip ABduction 3-/5    Left Hip Flexion 4-/5    Right/Left Knee Right;Left    Right Knee Flexion 5/5    Right Knee Extension 5/5    Left Knee Flexion 5/5    Left Knee Extension 5/5    Right/Left Ankle Right;Left      Bed Mobility   Bed Mobility Rolling Right;Rolling Left;Supine to Sit;Sit to Supine    Rolling Right Independent    Rolling Left Independent    Supine to Sit Minimal Assistance - Patient > 75%   needs HHA to pull up to sitting; increased time/effort   Sit to Supine Minimal Assistance - Patient > 75%   requires assistance for lifting legs into bed     Transfers   Comments uses RW, supervision, increased time/effort      Ambulation/Gait   Gait Comments Pt ambulates with RW, wide base of support, heavy flexed posture, slow gait speed, decreased step length bilaterally, decreased hip/knee flexion with increased lateral rotation for step length, decreased hip extension in stance; requires CGA for safety with chair follow; limited to short distances      Standardized Balance Assessment   Standardized Balance Assessment Five Times Sit to Stand;10 meter walk test    Five times sit to stand comments  23.84 sec with 2 HHA and RW    10 Meter Walk 0.08 m/s with RW (111.8 sec); high fall risk, homebound      High Level Balance   High Level Balance Comments Requires RW for standing assistance and CGA for safety with standing                         Objective measurements completed on examination: See above findings.  PT Education - 10/04/20 1212     Education Details recommendations/POC    Person(s) Educated Patient    Methods Explanation    Comprehension Verbalized understanding              PT Short Term Goals - 10/04/20 1218       PT SHORT TERM GOAL #1   Title Patient will be adherent to HEP at least 3x a week to improve functional strength and balance for better safety at home.    Time 4    Period Weeks    Status New    Target Date 11/01/20      PT SHORT TERM GOAL #2   Title Patient will be independent in bed mobility including sit to supine utilizing bed rails as needed for increased mobility at home.    Baseline 6/29: requires min A    Time 4    Period Weeks    Status New    Target Date 11/01/20               PT Long Term Goals - 10/04/20 1219       PT LONG TERM GOAL #1   Title Patient will improve FOTO score to > 50% to indicate improve functional mobility at home and in community.    Baseline 6/29: 42%    Time 8    Period Weeks    Status New    Target Date 11/29/20      PT LONG TERM GOAL #2   Title Patient (< 76 years old) will complete five times sit to stand test in < 20 seconds indicating an increased LE strength and improved balance.    Baseline 6/29: 23.8 sec with 2HHA    Time 8    Period Weeks    Status New    Target Date 11/29/20      PT LONG TERM GOAL #3   Title Patient will increase 10 meter walk test to >0.15 m/s (60 sec or less) as to improve gait speed for better home ambulation and to reduce fall risk.    Baseline 6/29: 0.08 m/s with RW    Time 8    Period Weeks    Status New    Target Date 11/29/20      PT LONG TERM GOAL #4   Title Patient will increase BLE gross strength to 4+/5 as to improve functional strength for independent gait, increased standing tolerance and increased ADL ability.    Baseline 6/29: see  flowsheet    Time 8    Period Weeks    Status New    Target Date 11/29/20      PT LONG TERM GOAL #5   Title Patient will ambulate 250 ft with least assistive device and Supervision with no breaks to allow for increased mobility within home.    Time 8    Period Weeks    Status New    Target Date 11/29/20                    Plan - 10/04/20 1213     Clinical Impression Statement 55 yo Male reports weakness and difficulty walking following CVA in February 2018. Patient had extensive outpatient PT from Nov 2018-March 2022 with improvements in mobility. Since being home he reports increased difficulty in bed mobility and walking. He does have a home aide 3x a week who assists with ADLs and helps with HEP. He reports walking around his home several  days a week, otherwise he primarily uses wheelchair for mobility. Patient does exhibit a decline in mobility with significantly slower gait speed as evidenced with decline in 10 meter walk test. He also exhibits slight decline in 5 times sit<>Stand. Patient would benefit from skilled PT intervention to improve strength, gait and ADL ability;    Personal Factors and Comorbidities Comorbidity 3+;Education;Past/Current Experience;Time since onset of injury/illness/exacerbation;Transportation    Comorbidities HTN, arthritis, past CVA, high fall risk, obesity    Examination-Activity Limitations Bed Mobility;Locomotion Level;Squat;Stairs;Stand;Toileting;Transfers    Examination-Participation Restrictions Community Activity;Driving;Cleaning;Meal Prep;Shop;Volunteer;Yard Work    Stability/Clinical Decision Making Stable/Uncomplicated    Designer, jewellery Low    Rehab Potential Good    PT Frequency 2x / week    PT Duration 8 weeks    PT Treatment/Interventions ADLs/Self Care Home Management;Cryotherapy;Moist Heat;Gait training;Stair training;Functional mobility training;Therapeutic activities;Therapeutic exercise;Balance training;Neuromuscular  re-education;Patient/family education;Orthotic Fit/Training;Passive range of motion;Energy conservation    PT Next Visit Plan Do Berg, reinforce HEP    PT Home Exercise Plan no new changes this session;    Consulted and Agree with Plan of Care Patient             Patient will benefit from skilled therapeutic intervention in order to improve the following deficits and impairments:  Abnormal gait, Decreased balance, Decreased endurance, Decreased mobility, Difficulty walking, Hypomobility, Obesity, Improper body mechanics, Decreased activity tolerance, Decreased strength, Postural dysfunction  Visit Diagnosis: Muscle weakness (generalized)  Unsteadiness on feet  Other abnormalities of gait and mobility     Problem List Patient Active Problem List   Diagnosis Date Noted   Impaired glucose tolerance 03/23/2020   Learning disability 03/23/2020   Urinary and fecal incontinence 06/14/2019   Chronic pain syndrome 03/23/2019   Constipation 03/23/2019   Dysarthria 03/23/2019   Dysphagia 03/23/2019   Learning difficulty 03/23/2019   Morbid obesity ([place]) 03/23/2019   Muscle weakness 03/23/2019   Vitamin D deficiency 03/23/2019   Bilateral carotid artery stenosis 12/15/2018   Moderate aortic valve stenosis 12/15/2018   BMI 45.0-49.9, adult ([place]) 11/10/2018   Cerebrovascular accident ([place]) 05/02/2016   Hemiparesis affecting right side as late effect of cerebrovascular accident ([place]) 05/01/2016   Brain tumor ([place]) 11/24/2014   Gastro-esophageal reflux disease without esophagitis 11/24/2014   Accident due to mechanical fall without injury 08/03/2014   Essential hypertension 06/13/2014   Mixed hyperlipidemia 06/13/2014   Unspecified sequelae of cerebral infarction 06/13/2014   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

## 2020-10-10 ENCOUNTER — Ambulatory Visit: Payer: Medicare Other | Admitting: Physical Therapy

## 2020-10-11 ENCOUNTER — Ambulatory Visit: Payer: Medicare Other | Attending: Neurology

## 2020-10-11 ENCOUNTER — Other Ambulatory Visit: Payer: Self-pay

## 2020-10-11 DIAGNOSIS — M6281 Muscle weakness (generalized): Secondary | ICD-10-CM | POA: Insufficient documentation

## 2020-10-11 DIAGNOSIS — R262 Difficulty in walking, not elsewhere classified: Secondary | ICD-10-CM | POA: Diagnosis present

## 2020-10-11 DIAGNOSIS — R2689 Other abnormalities of gait and mobility: Secondary | ICD-10-CM | POA: Diagnosis present

## 2020-10-11 DIAGNOSIS — R2681 Unsteadiness on feet: Secondary | ICD-10-CM | POA: Diagnosis present

## 2020-10-11 DIAGNOSIS — R278 Other lack of coordination: Secondary | ICD-10-CM | POA: Diagnosis present

## 2020-10-11 DIAGNOSIS — R269 Unspecified abnormalities of gait and mobility: Secondary | ICD-10-CM | POA: Insufficient documentation

## 2020-10-11 NOTE — Therapy (Signed)
[place] MAIN [facility] SERVICES [address] [place], [place], [address] Phone: [phone]   Fax:  [phone]  Physical Therapy Treatment  Patient Details  Name: Curtis Powell MRN: [id number] Date of Birth: [birth year] Referring Provider (PT): Kym Groom MD   Encounter Date: 10/11/2020   PT End of Session - 10/11/20 1450     Visit Number 2    Number of Visits 17    Date for PT Re-Evaluation 11/29/20    Authorization Type start of care 10/04/20    PT Start Time 1302    PT Stop Time 1345    PT Time Calculation (min) 43 min    Equipment Utilized During Treatment Gait belt    Activity Tolerance Patient tolerated treatment well    Behavior During Therapy WFL for tasks assessed/performed             Past Medical History:  Diagnosis Date   GERD (gastroesophageal reflux disease)    Hyperlipidemia    Hypertension    Obesity    Stroke [facility])     Past Surgical History:  Procedure Laterality Date   BRAIN SURGERY      There were no vitals filed for this visit.   Subjective Assessment - 10/11/20 1449     Subjective Patient reports he did his exercises with his caregiver (his uncle) since evaluation and earlier today. No falls or LOB since last session.    Pertinent History Patient is a pleasant 55 year old male who presents to physical therapy for weakness and immobility secondary to CVA.  Had a stroke in Feb 2018. He was previously fully independent, but this stroke caused severe residual deficits, mainly on the right side. He reports difficulty walking and decreased mobility. His mom had to move in with him and now is his main caregiver.  Patient had outpatient PT from November 2018-March 2022. Patient reports since being discharged from outpatient therapy he has kept up with his HEP daily. He does report desire for walking better and for getting in and out of bed easier. He reports his mom is helping with some ADLs. He reports needing help with  rolling in bed. He reports his mom/aide assist with dressing. He has a home aide 3 days out of the week;    Limitations Standing;Walking    How long can you sit comfortably? NA    How long can you stand comfortably? <30 min, does minimal standing;    How long can you walk comfortably? depends on the day, fatigue affects his walking; He is walking every day, about 15-20 min;    Diagnostic tests None recent    Patient Stated Goals to walk better and be independent in bed mobility;    Currently in Pain? No/denies                [facility] PT Assessment - 10/11/20 0001       Standardized Balance Assessment   Standardized Balance Assessment Berg Balance Test      Berg Balance Test   Sit to Stand Able to stand using hands after several tries    Standing Unsupported Unable to stand 30 seconds unassisted    Sitting with Back Unsupported but Feet Supported on Floor or Stool Able to sit safely and securely 2 minutes    Stand to Sit Sits independently, has uncontrolled descent    Transfers Needs one person to assist    Standing Unsupported with Eyes Closed Able to stand 3  seconds    Standing Unsupported with Feet Together Needs help to attain position and unable to hold for 15 seconds    From Standing, Reach Forward with Outstretched Arm Reaches forward but needs supervision    From Standing Position, Pick up Object from Floor Unable to try/needs assist to keep balance    From Standing Position, Turn to Look Behind Over each Shoulder Needs assist to keep from losing balance and falling    Turn 360 Degrees Needs assistance while turning    Standing Unsupported, Alternately Place Feet on Step/Stool Needs assistance to keep from falling or unable to try    Standing Unsupported, One Foot in Front Loses balance while stepping or standing    Standing on One Leg Unable to try or needs assist to prevent fall    Total Score 11              TREATMENT:   BERG: 11/ 56   Ambulate with 120 ft  with RW; two seated rest breaks.Patient requires min verbal cues for proximity to walker throughout gait trial as well as upright posture and weight shift. One LOB due to not picking up foot requiring PT assistance to retain upright position   Sit to stand throw ball to target; one hand on wheelchair one hand on walker: stand then grab a ball from PT, throw with RUE occasional posterior LOB requiring PT assistance to remain upright x15 targets  Standing with RW: March 10x each LE cue for upright posture and height of hip flexion .  Static stand 2 minutes with no UE support.    Vitals monitored throughout session with rest breaks to return HR to therapeutic range.    Pt educated throughout session about proper posture and technique with exercises. Improved exercise technique, movement at target joints, use of target muscles after min to mod verbal, visual, tactile cues.   Patient tolerated session well, BERG performed with patient scoring an 11/56 indicating an area for focus for decreased fall risk. Patient is challenged with moving LE's without heavy UE support at this time. Ambulation performed with cues required for foot clearance. Patient will benefit from skilled physical therapy to increase strength, mobility, and stability for decreased fall risk and improved ADL ability.               PT Education - 10/11/20 1450     Education Details exercise technique, body mechanics    Person(s) Educated Patient    Methods Explanation;Demonstration;Tactile cues;Verbal cues    Comprehension Verbalized understanding;Returned demonstration;Verbal cues required;Tactile cues required              PT Short Term Goals - 10/04/20 1218       PT SHORT TERM GOAL #1   Title Patient will be adherent to HEP at least 3x a week to improve functional strength and balance for better safety at home.    Time 4    Period Weeks    Status New    Target Date 11/01/20      PT SHORT TERM GOAL #2    Title Patient will be independent in bed mobility including sit to supine utilizing bed rails as needed for increased mobility at home.    Baseline 6/29: requires min A    Time 4    Period Weeks    Status New    Target Date 11/01/20               PT Long Term Goals -  10/04/20 1219       PT LONG TERM GOAL #1   Title Patient will improve FOTO score to > 50% to indicate improve functional mobility at home and in community.    Baseline 6/29: 42%    Time 8    Period Weeks    Status New    Target Date 11/29/20      PT LONG TERM GOAL #2   Title Patient (< 76 years old) will complete five times sit to stand test in < 20 seconds indicating an increased LE strength and improved balance.    Baseline 6/29: 23.8 sec with 2HHA    Time 8    Period Weeks    Status New    Target Date 11/29/20      PT LONG TERM GOAL #3   Title Patient will increase 10 meter walk test to >0.15 m/s (60 sec or less) as to improve gait speed for better home ambulation and to reduce fall risk.    Baseline 6/29: 0.08 m/s with RW    Time 8    Period Weeks    Status New    Target Date 11/29/20      PT LONG TERM GOAL #4   Title Patient will increase BLE gross strength to 4+/5 as to improve functional strength for independent gait, increased standing tolerance and increased ADL ability.    Baseline 6/29: see flowsheet    Time 8    Period Weeks    Status New    Target Date 11/29/20      PT LONG TERM GOAL #5   Title Patient will ambulate 250 ft with least assistive device and Supervision with no breaks to allow for increased mobility within home.    Time 8    Period Weeks    Status New    Target Date 11/29/20                   Plan - 10/11/20 1456     Clinical Impression Statement Patient tolerated session well, BERG performed with patient scoring an 11/56 indicating an area for focus for decreased fall risk. Patient is challenged with moving LE's without heavy UE support at this time.  Ambulation performed with cues required for foot clearance. Patient will benefit from skilled physical therapy to increase strength, mobility, and stability for decreased fall risk and improved ADL ability.    Personal Factors and Comorbidities Comorbidity 3+;Education;Past/Current Experience;Time since onset of injury/illness/exacerbation;Transportation    Comorbidities HTN, arthritis, past CVA, high fall risk, obesity    Examination-Activity Limitations Bed Mobility;Locomotion Level;Squat;Stairs;Stand;Toileting;Transfers    Examination-Participation Restrictions Community Activity;Driving;Cleaning;Meal Prep;Shop;Volunteer;Yard Work    Stability/Clinical Decision Making Stable/Uncomplicated    Rehab Potential Good    PT Frequency 2x / week    PT Duration 8 weeks    PT Treatment/Interventions ADLs/Self Care Home Management;Cryotherapy;Moist Heat;Gait training;Stair training;Functional mobility training;Therapeutic activities;Therapeutic exercise;Balance training;Neuromuscular re-education;Patient/family education;Orthotic Fit/Training;Passive range of motion;Energy conservation    PT Next Visit Plan ambulate    PT Home Exercise Plan no new changes this session;    Consulted and Agree with Plan of Care Patient             Patient will benefit from skilled therapeutic intervention in order to improve the following deficits and impairments:  Abnormal gait, Decreased balance, Decreased endurance, Decreased mobility, Difficulty walking, Hypomobility, Obesity, Improper body mechanics, Decreased activity tolerance, Decreased strength, Postural dysfunction  Visit Diagnosis: Muscle weakness (generalized)  Unsteadiness on feet  Other  abnormalities of gait and mobility     Problem List Patient Active Problem List   Diagnosis Date Noted   Impaired glucose tolerance 03/23/2020   Learning disability 03/23/2020   Urinary and fecal incontinence 06/14/2019   Chronic pain syndrome 03/23/2019    Constipation 03/23/2019   Dysarthria 03/23/2019   Dysphagia 03/23/2019   Learning difficulty 03/23/2019   Morbid obesity ([place]) 03/23/2019   Muscle weakness 03/23/2019   Vitamin D deficiency 03/23/2019   Bilateral carotid artery stenosis 12/15/2018   Moderate aortic valve stenosis 12/15/2018   BMI 45.0-49.9, adult ([place]) 11/10/2018   Cerebrovascular accident ([place]) 05/02/2016   Hemiparesis affecting right side as late effect of cerebrovascular accident ([place]) 05/01/2016   Brain tumor ([place]) 11/24/2014   Gastro-esophageal reflux disease without esophagitis 11/24/2014   Accident due to mechanical fall without injury 08/03/2014   Essential hypertension 06/13/2014   Mixed hyperlipidemia 06/13/2014   Unspecified sequelae of cerebral infarction 06/13/2014   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

## 2020-10-18 ENCOUNTER — Other Ambulatory Visit: Payer: Self-pay

## 2020-10-18 ENCOUNTER — Ambulatory Visit: Payer: Medicare Other

## 2020-10-18 DIAGNOSIS — M6281 Muscle weakness (generalized): Secondary | ICD-10-CM

## 2020-10-18 DIAGNOSIS — R2681 Unsteadiness on feet: Secondary | ICD-10-CM

## 2020-10-18 DIAGNOSIS — R2689 Other abnormalities of gait and mobility: Secondary | ICD-10-CM

## 2020-10-18 NOTE — Therapy (Signed)
[place] MAIN [facility] SERVICES [address] [place], [place], [address] Phone: [phone]   Fax:  [phone]  Physical Therapy Treatment  Patient Details  Name: Curtis Powell MRN: [id number] Date of Birth: 09-02-1965 Referring Provider (PT): Kym Groom MD   Encounter Date: 10/18/2020   PT End of Session - 10/18/20 1312     Visit Number 3    Number of Visits 17    Date for PT Re-Evaluation 11/29/20    Authorization Type start of care 10/04/20    PT Start Time 3825    PT Stop Time 1344    PT Time Calculation (min) 45 min    Equipment Utilized During Treatment Gait belt    Activity Tolerance Patient tolerated treatment well    Behavior During Therapy WFL for tasks assessed/performed             Past Medical History:  Diagnosis Date   GERD (gastroesophageal reflux disease)    Hyperlipidemia    Hypertension    Obesity    Stroke [facility])     Past Surgical History:  Procedure Laterality Date   BRAIN SURGERY      There were no vitals filed for this visit.   Subjective Assessment - 10/18/20 1311     Subjective Patient reports he has been doing well. Has done his HEP, his uncle is his caregiver now.    Pertinent History Patient is a pleasant 55 year old male who presents to physical therapy for weakness and immobility secondary to CVA.  Had a stroke in Feb 2018. He was previously fully independent, but this stroke caused severe residual deficits, mainly on the right side. He reports difficulty walking and decreased mobility. His mom had to move in with him and now is his main caregiver.  Patient had outpatient PT from November 2018-March 2022. Patient reports since being discharged from outpatient therapy he has kept up with his HEP daily. He does report desire for walking better and for getting in and out of bed easier. He reports his mom is helping with some ADLs. He reports needing help with rolling in bed. He reports his mom/aide assist  with dressing. He has a home aide 3 days out of the week;    Limitations Standing;Walking    How long can you sit comfortably? NA    How long can you stand comfortably? <30 min, does minimal standing;    How long can you walk comfortably? depends on the day, fatigue affects his walking; He is walking every day, about 15-20 min;    Diagnostic tests None recent    Patient Stated Goals to walk better and be independent in bed mobility;    Currently in Pain? No/denies                 TherEx: cues for body mechanics and sequencing: Nustep lvl 3-4 seat position 8, cue for RPM> 60 for cardiovascular and musculoskeletal challenge x4 minutes  Ambulate with 120 ft with RW; two seated rest breaks.Patient requires min verbal cues for proximity to walker throughout gait trial as well as upright posture and weight shift. One LOB due to not picking up foot requiring PT assistance to retain upright position     Standing with RW: March 10x each LE into YTB cue for upright posture and height of hip flexion  Static stand GTB taps on floor for foot placement and clearance 10x each LE ; patient reports is medium.  Sit to  stand 10x; one hand on walker one hand on chair  TherAct:  Toileting: requires min A for pulling up pants, requires assistance for positioning and supervision for STS transfer.  Stand pivot transfers with focus on hand placement and proper use of RW with close CGA x4  Vitals monitored throughout session with rest breaks to return HR to therapeutic range.    Pt educated throughout session about proper posture and technique with exercises. Improved exercise technique, movement at target joints, use of target muscles after min to mod verbal, visual, tactile cues     Patient presents with excellent motivation to physical therapy. He requires assistance for toileting this session due to limited reach in standing as well as supervision for standing. Patient has decreased foot clearance  bilaterally as he fatigues as well as increased trunk flexion which will continue to be an area of work. Patient will benefit from skilled physical therapy to increase strength, mobility, and stability for decreased fall risk and improved ADL ability.           PT Education - 10/18/20 1312     Education Details exercise technique, body mechanics    Person(s) Educated Patient    Methods Explanation;Demonstration;Tactile cues;Verbal cues    Comprehension Verbalized understanding;Returned demonstration;Verbal cues required;Tactile cues required              PT Short Term Goals - 10/04/20 1218       PT SHORT TERM GOAL #1   Title Patient will be adherent to HEP at least 3x a week to improve functional strength and balance for better safety at home.    Time 4    Period Weeks    Status New    Target Date 11/01/20      PT SHORT TERM GOAL #2   Title Patient will be independent in bed mobility including sit to supine utilizing bed rails as needed for increased mobility at home.    Baseline 6/29: requires min A    Time 4    Period Weeks    Status New    Target Date 11/01/20               PT Long Term Goals - 10/04/20 1219       PT LONG TERM GOAL #1   Title Patient will improve FOTO score to > 50% to indicate improve functional mobility at home and in community.    Baseline 6/29: 42%    Time 8    Period Weeks    Status New    Target Date 11/29/20      PT LONG TERM GOAL #2   Title Patient (< 16 years old) will complete five times sit to stand test in < 20 seconds indicating an increased LE strength and improved balance.    Baseline 6/29: 23.8 sec with 2HHA    Time 8    Period Weeks    Status New    Target Date 11/29/20      PT LONG TERM GOAL #3   Title Patient will increase 10 meter walk test to >0.15 m/s (60 sec or less) as to improve gait speed for better home ambulation and to reduce fall risk.    Baseline 6/29: 0.08 m/s with RW    Time 8    Period Weeks     Status New    Target Date 11/29/20      PT LONG TERM GOAL #4   Title Patient will increase BLE gross strength  to 4+/5 as to improve functional strength for independent gait, increased standing tolerance and increased ADL ability.    Baseline 6/29: see flowsheet    Time 8    Period Weeks    Status New    Target Date 11/29/20      PT LONG TERM GOAL #5   Title Patient will ambulate 250 ft with least assistive device and Supervision with no breaks to allow for increased mobility within home.    Time 8    Period Weeks    Status New    Target Date 11/29/20                   Plan - 10/18/20 1447     Clinical Impression Statement Patient presents with excellent motivation to physical therapy. He requires assistance for toileting this session due to limited reach in standing as well as supervision for standing. Patient has decreased foot clearance bilaterally as he fatigues as well as increased trunk flexion which will continue to be an area of work. Patient will benefit from skilled physical therapy to increase strength, mobility, and stability for decreased fall risk and improved ADL ability.    Personal Factors and Comorbidities Comorbidity 3+;Education;Past/Current Experience;Time since onset of injury/illness/exacerbation;Transportation    Comorbidities HTN, arthritis, past CVA, high fall risk, obesity    Examination-Activity Limitations Bed Mobility;Locomotion Level;Squat;Stairs;Stand;Toileting;Transfers    Examination-Participation Restrictions Community Activity;Driving;Cleaning;Meal Prep;Shop;Volunteer;Yard Work    Stability/Clinical Decision Making Stable/Uncomplicated    Rehab Potential Good    PT Frequency 2x / week    PT Duration 8 weeks    PT Treatment/Interventions ADLs/Self Care Home Management;Cryotherapy;Moist Heat;Gait training;Stair training;Functional mobility training;Therapeutic activities;Therapeutic exercise;Balance training;Neuromuscular  re-education;Patient/family education;Orthotic Fit/Training;Passive range of motion;Energy conservation    PT Next Visit Plan ambulate    PT Home Exercise Plan no new changes this session;    Consulted and Agree with Plan of Care Patient             Patient will benefit from skilled therapeutic intervention in order to improve the following deficits and impairments:  Abnormal gait, Decreased balance, Decreased endurance, Decreased mobility, Difficulty walking, Hypomobility, Obesity, Improper body mechanics, Decreased activity tolerance, Decreased strength, Postural dysfunction  Visit Diagnosis: Muscle weakness (generalized)  Unsteadiness on feet  Other abnormalities of gait and mobility     Problem List Patient Active Problem List   Diagnosis Date Noted   Impaired glucose tolerance 03/23/2020   Learning disability 03/23/2020   Urinary and fecal incontinence 06/14/2019   Chronic pain syndrome 03/23/2019   Constipation 03/23/2019   Dysarthria 03/23/2019   Dysphagia 03/23/2019   Learning difficulty 03/23/2019   Morbid obesity ([place]) 03/23/2019   Muscle weakness 03/23/2019   Vitamin D deficiency 03/23/2019   Bilateral carotid artery stenosis 12/15/2018   Moderate aortic valve stenosis 12/15/2018   BMI 45.0-49.9, adult ([place]) 11/10/2018   Cerebrovascular accident ([place]) 05/02/2016   Hemiparesis affecting right side as late effect of cerebrovascular accident ([place]) 05/01/2016   Brain tumor ([place]) 11/24/2014   Gastro-esophageal reflux disease without esophagitis 11/24/2014   Accident due to mechanical fall without injury 08/03/2014   Essential hypertension 06/13/2014   Mixed hyperlipidemia 06/13/2014   Unspecified sequelae of cerebral infarction 06/13/2014   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/2014   Type 2 diabetes mellitus without complication ([place]) 00/76/2263   Neck mass 09/08/2013   Swimmer's ear 09/08/2013   Erectile dysfunction 01/03/2013   Prediabetes 01/03/2013   Janna Arch, PT,  DPT  10/18/2020, 2:49 PM  [place] [facility] SERVICES [place], [place], [address] Phone: [phone]   Fax:  [phone]  Name: MAXXIMUS GOTAY MRN: [id number] Date of Birth: 14-Mar-1966

## 2020-10-23 ENCOUNTER — Ambulatory Visit: Payer: Medicare Other

## 2020-10-25 ENCOUNTER — Other Ambulatory Visit: Payer: Self-pay

## 2020-10-25 ENCOUNTER — Ambulatory Visit: Payer: Medicare Other

## 2020-10-25 DIAGNOSIS — M6281 Muscle weakness (generalized): Secondary | ICD-10-CM | POA: Diagnosis not present

## 2020-10-25 DIAGNOSIS — R2681 Unsteadiness on feet: Secondary | ICD-10-CM

## 2020-10-25 DIAGNOSIS — R2689 Other abnormalities of gait and mobility: Secondary | ICD-10-CM

## 2020-10-25 NOTE — Therapy (Signed)
[place] MAIN [facility] SERVICES [address] [place], [place], [address] Phone: [phone]   Fax:  [phone]  Physical Therapy Treatment  Patient Details  Name: Curtis Powell MRN: [id number] Date of Birth: [birth year] Referring Provider (PT): Kym Groom MD   Encounter Date: 10/25/2020   PT End of Session - 10/25/20 1256     Visit Number 4    Number of Visits 17    Date for PT Re-Evaluation 11/29/20    Authorization Type start of care 10/04/20    PT Start Time 1287    PT Stop Time 1344    PT Time Calculation (min) 45 min    Equipment Utilized During Treatment Gait belt    Activity Tolerance Patient tolerated treatment well    Behavior During Therapy WFL for tasks assessed/performed             Past Medical History:  Diagnosis Date   GERD (gastroesophageal reflux disease)    Hyperlipidemia    Hypertension    Obesity    Stroke [facility]     Past Surgical History:  Procedure Laterality Date   BRAIN SURGERY      There were no vitals filed for this visit.   Subjective Assessment - 10/25/20 1327     Subjective Patient reports he has been having a hard time getting up into standing from sitting in his chair and/or bed.  No falls or LOB since last session.    Pertinent History Patient is a pleasant 55 year old male who presents to physical therapy for weakness and immobility secondary to CVA.  Had a stroke in Feb 2018. He was previously fully independent, but this stroke caused severe residual deficits, mainly on the right side. He reports difficulty walking and decreased mobility. His mom had to move in with him and now is his main caregiver.  Patient had outpatient PT from November 2018-March 2022. Patient reports since being discharged from outpatient therapy he has kept up with his HEP daily. He does report desire for walking better and for getting in and out of bed easier. He reports his mom is helping with some ADLs. He reports needing  help with rolling in bed. He reports his mom/aide assist with dressing. He has a home aide 3 days out of the week;    Limitations Standing;Walking    How long can you sit comfortably? NA    How long can you stand comfortably? <30 min, does minimal standing;    How long can you walk comfortably? depends on the day, fatigue affects his walking; He is walking every day, about 15-20 min;    Diagnostic tests None recent    Patient Stated Goals to walk better and be independent in bed mobility;    Currently in Pain? No/denies            Patient reports he has been having a hard time getting up into standing from sitting in his chair and/or bed.       TherEx: cues for body mechanics and sequencing:  Ambulate with 140 ft with RW; with no seated rest breaks .Patient requires min verbal cues for proximity to walker throughout gait trial as well as upright posture and weight shift. One LOB due to not picking up foot requiring PT assistance to retain upright position    Sit to stand from plinth table: 10x with focus on technique ; one hand on walker one hand on bed. X2 sets  Standing with  RW: PVC pipe chest press 10x; occasional min A to maintain upright posture Static stand with alternating UE raises 10x each UE.  March 10x each LE into YTB cue for upright posture and height of hip flexion; stabilization to RW performed.   Stand pivot transfer x 2 with assistance for stabilization of RW  Seated: RTB row 15x to decrease flexion tone  Toileting: requires min A for pulling up pants, requires assistance for positioning and supervision for STS transfer.    Vitals monitored throughout session with rest breaks to return HR to therapeutic range.    Pt educated throughout session about proper posture and technique with exercises. Improved exercise technique, movement at target joints, use of target muscles after min to mod verbal, visual, tactile cues    Patient is highly motivated throughout  session. He requires frequent cueing for foot clearance and upright posture due to increased trunk flexion with fatigue. Transfer training performed with patient fatiguing but tolerating intervention well. Patient will benefit from skilled physical therapy to increase strength, mobility, and stability for decreased fall risk and improved ADL ability.                   PT Education - 10/25/20 1257     Education Details exercise technique, body mechanics    Person(s) Educated Patient    Methods Explanation;Demonstration;Tactile cues;Verbal cues    Comprehension Verbalized understanding;Returned demonstration;Verbal cues required;Tactile cues required              PT Short Term Goals - 10/04/20 1218       PT SHORT TERM GOAL #1   Title Patient will be adherent to HEP at least 3x a week to improve functional strength and balance for better safety at home.    Time 4    Period Weeks    Status New    Target Date 11/01/20      PT SHORT TERM GOAL #2   Title Patient will be independent in bed mobility including sit to supine utilizing bed rails as needed for increased mobility at home.    Baseline 6/29: requires min A    Time 4    Period Weeks    Status New    Target Date 11/01/20               PT Long Term Goals - 10/04/20 1219       PT LONG TERM GOAL #1   Title Patient will improve FOTO score to > 50% to indicate improve functional mobility at home and in community.    Baseline 6/29: 42%    Time 8    Period Weeks    Status New    Target Date 11/29/20      PT LONG TERM GOAL #2   Title Patient (< 26 years old) will complete five times sit to stand test in < 20 seconds indicating an increased LE strength and improved balance.    Baseline 6/29: 23.8 sec with 2HHA    Time 8    Period Weeks    Status New    Target Date 11/29/20      PT LONG TERM GOAL #3   Title Patient will increase 10 meter walk test to >0.15 m/s (60 sec or less) as to improve gait speed  for better home ambulation and to reduce fall risk.    Baseline 6/29: 0.08 m/s with RW    Time 8    Period Weeks    Status New  Target Date 11/29/20      PT LONG TERM GOAL #4   Title Patient will increase BLE gross strength to 4+/5 as to improve functional strength for independent gait, increased standing tolerance and increased ADL ability.    Baseline 6/29: see flowsheet    Time 8    Period Weeks    Status New    Target Date 11/29/20      PT LONG TERM GOAL #5   Title Patient will ambulate 250 ft with least assistive device and Supervision with no breaks to allow for increased mobility within home.    Time 8    Period Weeks    Status New    Target Date 11/29/20                   Plan - 10/25/20 1333     Clinical Impression Statement Patient is highly motivated throughout session. He requires frequent cueing for foot clearance and upright posture due to increased trunk flexion with fatigue. Transfer training performed with patient fatiguing but tolerating intervention well. Patient will benefit from skilled physical therapy to increase strength, mobility, and stability for decreased fall risk and improved ADL ability.    Personal Factors and Comorbidities Comorbidity 3+;Education;Past/Current Experience;Time since onset of injury/illness/exacerbation;Transportation    Comorbidities HTN, arthritis, past CVA, high fall risk, obesity    Examination-Activity Limitations Bed Mobility;Locomotion Level;Squat;Stairs;Stand;Toileting;Transfers    Examination-Participation Restrictions Community Activity;Driving;Cleaning;Meal Prep;Shop;Volunteer;Yard Work    Stability/Clinical Decision Making Stable/Uncomplicated    Rehab Potential Good    PT Frequency 2x / week    PT Duration 8 weeks    PT Treatment/Interventions ADLs/Self Care Home Management;Cryotherapy;Moist Heat;Gait training;Stair training;Functional mobility training;Therapeutic activities;Therapeutic exercise;Balance  training;Neuromuscular re-education;Patient/family education;Orthotic Fit/Training;Passive range of motion;Energy conservation    PT Next Visit Plan ambulate    PT Home Exercise Plan no new changes this session;    Consulted and Agree with Plan of Care Patient             Patient will benefit from skilled therapeutic intervention in order to improve the following deficits and impairments:  Abnormal gait, Decreased balance, Decreased endurance, Decreased mobility, Difficulty walking, Hypomobility, Obesity, Improper body mechanics, Decreased activity tolerance, Decreased strength, Postural dysfunction  Visit Diagnosis: Muscle weakness (generalized)  Unsteadiness on feet  Other abnormalities of gait and mobility     Problem List Patient Active Problem List   Diagnosis Date Noted   Impaired glucose tolerance 03/23/2020   Learning disability 03/23/2020   Urinary and fecal incontinence 06/14/2019   Chronic pain syndrome 03/23/2019   Constipation 03/23/2019   Dysarthria 03/23/2019   Dysphagia 03/23/2019   Learning difficulty 03/23/2019   Morbid obesity ([place]) 03/23/2019   Muscle weakness 03/23/2019   Vitamin D deficiency 03/23/2019   Bilateral carotid artery stenosis 12/15/2018   Moderate aortic valve stenosis 12/15/2018   BMI 45.0-49.9, adult ([place]) 11/10/2018   Cerebrovascular accident ([place]) 05/02/2016   Hemiparesis affecting right side as late effect of cerebrovascular accident ([place]) 05/01/2016   Brain tumor ([place]) 11/24/2014   Gastro-esophageal reflux disease without esophagitis 11/24/2014   Accident due to mechanical fall without injury 08/03/2014   Essential hypertension 06/13/2014   Mixed hyperlipidemia 06/13/2014   Unspecified sequelae of cerebral infarction 06/13/2014   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/2014   Type 2 diabetes mellitus without complication ([place]) 12/75/1700   Neck mass 09/08/2013   Swimmer's ear 09/08/2013   Erectile dysfunction 01/03/2013   Prediabetes 01/03/2013     Janna Arch, PT, DPT  10/25/2020, 1:51  PM  [place] MAIN [facility] SERVICES [address] [place], [place], [address] Phone: [phone]   Fax:  [phone]  Name: Curtis Powell MRN: [id number] Date of Birth: July 13, 1965

## 2020-10-30 ENCOUNTER — Other Ambulatory Visit: Payer: Self-pay

## 2020-10-30 ENCOUNTER — Ambulatory Visit: Payer: Medicare Other

## 2020-10-30 DIAGNOSIS — R262 Difficulty in walking, not elsewhere classified: Secondary | ICD-10-CM

## 2020-10-30 DIAGNOSIS — M6281 Muscle weakness (generalized): Secondary | ICD-10-CM | POA: Diagnosis not present

## 2020-10-30 DIAGNOSIS — R269 Unspecified abnormalities of gait and mobility: Secondary | ICD-10-CM

## 2020-10-30 DIAGNOSIS — R278 Other lack of coordination: Secondary | ICD-10-CM

## 2020-10-30 NOTE — Therapy (Signed)
[place] MAIN [facility] SERVICES [address] [place], [place], [address] Phone: [phone]   Fax:  [phone]  Physical Therapy Treatment  Patient Details  Name: Curtis Powell MRN: [id number] Date of Birth: 1965/06/27 Referring Provider (PT): Kym Groom MD   Encounter Date: 10/30/2020   PT End of Session - 10/30/20 1443     Visit Number 5    Number of Visits 17    Date for PT Re-Evaluation 11/29/20    Authorization Type start of care 10/04/20    PT Start Time 1430    PT Stop Time [id number]    PT Time Calculation (min) 44 min    Equipment Utilized During Treatment Gait belt    Activity Tolerance Patient tolerated treatment well    Behavior During Therapy WFL for tasks assessed/performed             Past Medical History:  Diagnosis Date   GERD (gastroesophageal reflux disease)    Hyperlipidemia    Hypertension    Obesity    Stroke [facility])     Past Surgical History:  Procedure Laterality Date   BRAIN SURGERY      There were no vitals filed for this visit.   Subjective Assessment - 10/30/20 1442     Subjective Patient reports he has been having a hard time getting up into standing from sitting in his chair and/or bed.  No falls or LOB since last session.    Pertinent History Patient is a pleasant 55 year old male who presents to physical therapy for weakness and immobility secondary to CVA.  Had a stroke in Feb 2018. He was previously fully independent, but this stroke caused severe residual deficits, mainly on the right side. He reports difficulty walking and decreased mobility. His mom had to move in with him and now is his main caregiver.  Patient had outpatient PT from November 2018-March 2022. Patient reports since being discharged from outpatient therapy he has kept up with his HEP daily. He does report desire for walking better and for getting in and out of bed easier. He reports his mom is helping with some ADLs. He reports needing  help with rolling in bed. He reports his mom/aide assist with dressing. He has a home aide 3 days out of the week;    Limitations Standing;Walking    How long can you sit comfortably? NA    How long can you stand comfortably? <30 min, does minimal standing;    How long can you walk comfortably? depends on the day, fatigue affects his walking; He is walking every day, about 15-20 min;    Diagnostic tests None recent    Patient Stated Goals to walk better and be independent in bed mobility;    Currently in Pain? No/denies                 Interventions:  Therapeutic exercises:  Dynamic walking: 75 feet with RW (no pediatric walker available to utilized an adult walker at lowest setting) and patient experienced difficulty with sequencing today- exhibiting mostly step to technique and stopping due to fatigue. He presented with decreased step height - dragging right foot at times and intermittent VC for erect posture.   Chair dips - from w/c - x 12 reps - Patient reported as medium and able to clear his bottom off the chair.   Seated LAQ  with 3 sec hold and VC for slow eccentric control x 12 reps BLE.  Seated Hip flex with 3 sec hold   Scap retraction- using GTB x 15 reps- Patient reports as medium.  Pt educated throughout session about proper posture and technique with exercises. Improved exercise technique, movement at target joints, use of target muscles after min to mod verbal, visual, tactile cues    Clinical Impression: Patient continues to require verbal  cueing for foot clearance and struggled with different walker today and presented with increased fatigue with walking. He performed well otherwise able to perform chair dips and all strengthening exercises without difficulty.  Patient will benefit from skilled physical therapy to increase strength, mobility, and stability for decreased fall risk and improved ADL ability.                    PT Education -  10/30/20 1443     Education Details exercise technique.    Person(s) Educated Patient    Methods Explanation;Demonstration;Tactile cues;Verbal cues    Comprehension Verbalized understanding;Returned demonstration;Verbal cues required;Tactile cues required;Need further instruction              PT Short Term Goals - 10/04/20 1218       PT SHORT TERM GOAL #1   Title Patient will be adherent to HEP at least 3x a week to improve functional strength and balance for better safety at home.    Time 4    Period Weeks    Status New    Target Date 11/01/20      PT SHORT TERM GOAL #2   Title Patient will be independent in bed mobility including sit to supine utilizing bed rails as needed for increased mobility at home.    Baseline 6/29: requires min A    Time 4    Period Weeks    Status New    Target Date 11/01/20               PT Long Term Goals - 10/04/20 1219       PT LONG TERM GOAL #1   Title Patient will improve FOTO score to > 50% to indicate improve functional mobility at home and in community.    Baseline 6/29: 42%    Time 8    Period Weeks    Status New    Target Date 11/29/20      PT LONG TERM GOAL #2   Title Patient (< 60 years old) will complete five times sit to stand test in < 20 seconds indicating an increased LE strength and improved balance.    Baseline 6/29: 23.8 sec with 2HHA    Time 8    Period Weeks    Status New    Target Date 11/29/20      PT LONG TERM GOAL #3   Title Patient will increase 10 meter walk test to >0.15 m/s (60 sec or less) as to improve gait speed for better home ambulation and to reduce fall risk.    Baseline 6/29: 0.08 m/s with RW    Time 8    Period Weeks    Status New    Target Date 11/29/20      PT LONG TERM GOAL #4   Title Patient will increase BLE gross strength to 4+/5 as to improve functional strength for independent gait, increased standing tolerance and increased ADL ability.    Baseline 6/29: see flowsheet     Time 8    Period Weeks    Status New    Target Date 11/29/20  PT LONG TERM GOAL #5   Title Patient will ambulate 250 ft with least assistive device and Supervision with no breaks to allow for increased mobility within home.    Time 8    Period Weeks    Status New    Target Date 11/29/20                   Plan - 10/30/20 1444     Clinical Impression Statement Patient continues to require verbal  cueing for foot clearance and struggled with different walker today and presented with increased fatigue with walking. He performed well otherwise able to perform chair dips and all strengthening exercises without difficulty.  Patient will benefit from skilled physical therapy to increase strength, mobility, and stability for decreased fall risk and improved ADL ability.    Personal Factors and Comorbidities Comorbidity 3+;Education;Past/Current Experience;Time since onset of injury/illness/exacerbation;Transportation    Comorbidities HTN, arthritis, past CVA, high fall risk, obesity    Examination-Activity Limitations Bed Mobility;Locomotion Level;Squat;Stairs;Stand;Toileting;Transfers    Examination-Participation Restrictions Community Activity;Driving;Cleaning;Meal Prep;Shop;Volunteer;Yard Work    Stability/Clinical Decision Making Stable/Uncomplicated    Rehab Potential Good    PT Frequency 2x / week    PT Duration 8 weeks    PT Treatment/Interventions ADLs/Self Care Home Management;Cryotherapy;Moist Heat;Gait training;Stair training;Functional mobility training;Therapeutic activities;Therapeutic exercise;Balance training;Neuromuscular re-education;Patient/family education;Orthotic Fit/Training;Passive range of motion;Energy conservation    PT Next Visit Plan Continue with progressive ambulation, transfer techniques, progressive LE strengthening    PT Home Exercise Plan no updates  this session;    Consulted and Agree with Plan of Care Patient             Patient will  benefit from skilled therapeutic intervention in order to improve the following deficits and impairments:  Abnormal gait, Decreased balance, Decreased endurance, Decreased mobility, Difficulty walking, Hypomobility, Obesity, Improper body mechanics, Decreased activity tolerance, Decreased strength, Postural dysfunction  Visit Diagnosis: Abnormality of gait and mobility  Difficulty in walking, not elsewhere classified  Muscle weakness (generalized)  Other lack of coordination     Problem List Patient Active Problem List   Diagnosis Date Noted   Impaired glucose tolerance 03/23/2020   Learning disability 03/23/2020   Urinary and fecal incontinence 06/14/2019   Chronic pain syndrome 03/23/2019   Constipation 03/23/2019   Dysarthria 03/23/2019   Dysphagia 03/23/2019   Learning difficulty 03/23/2019   Morbid obesity ([place]) 03/23/2019   Muscle weakness 03/23/2019   Vitamin D deficiency 03/23/2019   Bilateral carotid artery stenosis 12/15/2018   Moderate aortic valve stenosis 12/15/2018   BMI 45.0-49.9, adult ([place]) 11/10/2018   Cerebrovascular accident ([place]) 05/02/2016   Hemiparesis affecting right side as late effect of cerebrovascular accident ([place]) 05/01/2016   Brain tumor ([place]) 11/24/2014   Gastro-esophageal reflux disease without esophagitis 11/24/2014   Accident due to mechanical fall without injury 08/03/2014   Essential hypertension 06/13/2014   Mixed hyperlipidemia 06/13/2014   Unspecified sequelae of cerebral infarction 06/13/2014   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 [place], [place], [address] Phone: [phone]   Fax:  [phone]  Name: LUE MAYEAUX MRN:  [id number] Date of Birth: Aug 15, 1965

## 2020-11-01 ENCOUNTER — Ambulatory Visit: Payer: Medicare Other

## 2020-11-01 ENCOUNTER — Other Ambulatory Visit: Payer: Self-pay

## 2020-11-01 DIAGNOSIS — M6281 Muscle weakness (generalized): Secondary | ICD-10-CM

## 2020-11-01 DIAGNOSIS — R269 Unspecified abnormalities of gait and mobility: Secondary | ICD-10-CM

## 2020-11-01 DIAGNOSIS — R262 Difficulty in walking, not elsewhere classified: Secondary | ICD-10-CM

## 2020-11-01 DIAGNOSIS — R2681 Unsteadiness on feet: Secondary | ICD-10-CM

## 2020-11-01 NOTE — Therapy (Signed)
[place] MAIN [facility] SERVICES [address] [place], [place], [address] Phone: [phone]   Fax:  [phone]  Physical Therapy Treatment  Patient Details  Name: Curtis Powell MRN: [id number] Date of Birth: May 15, 1965 Referring Provider (PT): Kym Groom MD   Encounter Date: 11/01/2020   PT End of Session - 11/01/20 1255     Visit Number 6    Number of Visits 17    Date for PT Re-Evaluation 11/29/20    Authorization Type start of care 10/04/20    PT Start Time [id number]    PT Stop Time 1344    PT Time Calculation (min) 45 min    Equipment Utilized During Treatment Gait belt    Activity Tolerance Patient tolerated treatment well    Behavior During Therapy WFL for tasks assessed/performed             Past Medical History:  Diagnosis Date   GERD (gastroesophageal reflux disease)    Hyperlipidemia    Hypertension    Obesity    Stroke [facility])     Past Surgical History:  Procedure Laterality Date   BRAIN SURGERY      There were no vitals filed for this visit.   Subjective Assessment - 11/01/20 1319     Subjective Patient reports he had a relaxing day yesterday, watched tv. Has been compliant with HEP.    Pertinent History Patient is a pleasant 55 year old male who presents to physical therapy for weakness and immobility secondary to CVA.  Had a stroke in Feb 2018. He was previously fully independent, but this stroke caused severe residual deficits, mainly on the right side. He reports difficulty walking and decreased mobility. His mom had to move in with him and now is his main caregiver.  Patient had outpatient PT from November 2018-March 2022. Patient reports since being discharged from outpatient therapy he has kept up with his HEP daily. He does report desire for walking better and for getting in and out of bed easier. He reports his mom is helping with some ADLs. He reports needing help with rolling in bed. He reports his mom/aide assist  with dressing. He has a home aide 3 days out of the week;    Limitations Standing;Walking    How long can you sit comfortably? NA    How long can you stand comfortably? <30 min, does minimal standing;    How long can you walk comfortably? depends on the day, fatigue affects his walking; He is walking every day, about 15-20 min;    Diagnostic tests None recent    Patient Stated Goals to walk better and be independent in bed mobility;    Currently in Pain? No/denies                 TherEx: cues for body mechanics and sequencing:   Ambulate with 140 ft with RW; with no seated rest breaks .Patient requires min verbal cues for proximity to walker throughout gait trial as well as upright posture and weight shift. One LOB due to not picking up foot requiring PT assistance to retain upright position    Sit to stand from wheelchair: 10x with focus on technique ; one hand on walker one hand on chair.   Standing with RW: PVC pipe chest press 10x; occasional min A to maintain upright posture March 10x each LE into YTB cue for upright posture and height of hip flexion; stabilization to RW performed.   Stand  pivot transfer x 2 with assistance for stabilization of RW   Seated: RTB row 15x to decrease flexion tone PVC pipe row 15x  PVC pipe overhead raise 10x  Toileting: requires min A for pulling up pants, requires assistance for positioning and supervision for STS transfer.    Vitals monitored throughout session with rest breaks to return HR to therapeutic range.    Pt educated throughout session about proper posture and technique with exercises. Improved exercise technique, movement at target joints, use of target muscles after min to mod verbal, visual, tactile cues    Patient more fatigued this session requiring more frequent rest breaks between interventions. He has more frequent posterior LOB this session compared to previous sessions as well as decreased control of eccentric  portion of sit to stand. Patient will benefit from skilled physical therapy to increase strength, mobility, and stability for decreased fall risk and improved ADL ability                  PT Education - 11/01/20 1255     Education Details exercise technique, body mechanics    Person(s) Educated Patient    Methods Explanation;Demonstration;Tactile cues;Verbal cues    Comprehension Verbalized understanding;Returned demonstration;Verbal cues required;Tactile cues required              PT Short Term Goals - 10/04/20 1218       PT SHORT TERM GOAL #1   Title Patient will be adherent to HEP at least 3x a week to improve functional strength and balance for better safety at home.    Time 4    Period Weeks    Status New    Target Date 11/01/20      PT SHORT TERM GOAL #2   Title Patient will be independent in bed mobility including sit to supine utilizing bed rails as needed for increased mobility at home.    Baseline 6/29: requires min A    Time 4    Period Weeks    Status New    Target Date 11/01/20               PT Long Term Goals - 10/04/20 1219       PT LONG TERM GOAL #1   Title Patient will improve FOTO score to > 50% to indicate improve functional mobility at home and in community.    Baseline 6/29: 42%    Time 8    Period Weeks    Status New    Target Date 11/29/20      PT LONG TERM GOAL #2   Title Patient (< 52 years old) will complete five times sit to stand test in < 20 seconds indicating an increased LE strength and improved balance.    Baseline 6/29: 23.8 sec with 2HHA    Time 8    Period Weeks    Status New    Target Date 11/29/20      PT LONG TERM GOAL #3   Title Patient will increase 10 meter walk test to >0.15 m/s (60 sec or less) as to improve gait speed for better home ambulation and to reduce fall risk.    Baseline 6/29: 0.08 m/s with RW    Time 8    Period Weeks    Status New    Target Date 11/29/20      PT LONG TERM GOAL #4    Title Patient will increase BLE gross strength to 4+/5 as to improve functional strength  for independent gait, increased standing tolerance and increased ADL ability.    Baseline 6/29: see flowsheet    Time 8    Period Weeks    Status New    Target Date 11/29/20      PT LONG TERM GOAL #5   Title Patient will ambulate 250 ft with least assistive device and Supervision with no breaks to allow for increased mobility within home.    Time 8    Period Weeks    Status New    Target Date 11/29/20                   Plan - 11/01/20 1332     Clinical Impression Statement Patient more fatigued this session requiring more frequent rest breaks between interventions. He has more frequent posterior LOB this session compared to previous sessions as well as decreased control of eccentric portion of sit to stand. Patient will benefit from skilled physical therapy to increase strength, mobility, and stability for decreased fall risk and improved ADL ability    Personal Factors and Comorbidities Comorbidity 3+;Education;Past/Current Experience;Time since onset of injury/illness/exacerbation;Transportation    Comorbidities HTN, arthritis, past CVA, high fall risk, obesity    Examination-Activity Limitations Bed Mobility;Locomotion Level;Squat;Stairs;Stand;Toileting;Transfers    Examination-Participation Restrictions Community Activity;Driving;Cleaning;Meal Prep;Shop;Volunteer;Yard Work    Stability/Clinical Decision Making Stable/Uncomplicated    Rehab Potential Good    PT Frequency 2x / week    PT Duration 8 weeks    PT Treatment/Interventions ADLs/Self Care Home Management;Cryotherapy;Moist Heat;Gait training;Stair training;Functional mobility training;Therapeutic activities;Therapeutic exercise;Balance training;Neuromuscular re-education;Patient/family education;Orthotic Fit/Training;Passive range of motion;Energy conservation    PT Next Visit Plan Continue with progressive ambulation, transfer  techniques, progressive LE strengthening    PT Home Exercise Plan no updates  this session;    Consulted and Agree with Plan of Care Patient             Patient will benefit from skilled therapeutic intervention in order to improve the following deficits and impairments:  Abnormal gait, Decreased balance, Decreased endurance, Decreased mobility, Difficulty walking, Hypomobility, Obesity, Improper body mechanics, Decreased activity tolerance, Decreased strength, Postural dysfunction  Visit Diagnosis: Abnormality of gait and mobility  Difficulty in walking, not elsewhere classified  Muscle weakness (generalized)  Unsteadiness on feet     Problem List Patient Active Problem List   Diagnosis Date Noted   Impaired glucose tolerance 03/23/2020   Learning disability 03/23/2020   Urinary and fecal incontinence 06/14/2019   Chronic pain syndrome 03/23/2019   Constipation 03/23/2019   Dysarthria 03/23/2019   Dysphagia 03/23/2019   Learning difficulty 03/23/2019   Morbid obesity ([place]) 03/23/2019   Muscle weakness 03/23/2019   Vitamin D deficiency 03/23/2019   Bilateral carotid artery stenosis 12/15/2018   Moderate aortic valve stenosis 12/15/2018   BMI 45.0-49.9, adult ([place]) 11/10/2018   Cerebrovascular accident ([place]) 05/02/2016   Hemiparesis affecting right side as late effect of cerebrovascular accident ([place]) 05/01/2016   Brain tumor ([place]) 11/24/2014   Gastro-esophageal reflux disease without esophagitis 11/24/2014   Accident due to mechanical fall without injury 08/03/2014   Essential hypertension 06/13/2014   Mixed hyperlipidemia 06/13/2014   Unspecified sequelae of cerebral infarction 06/13/2014   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

## 2020-11-06 ENCOUNTER — Ambulatory Visit: Payer: Medicare Other | Attending: Family Medicine

## 2020-11-06 ENCOUNTER — Other Ambulatory Visit: Payer: Self-pay

## 2020-11-06 DIAGNOSIS — M6281 Muscle weakness (generalized): Secondary | ICD-10-CM | POA: Insufficient documentation

## 2020-11-06 DIAGNOSIS — I639 Cerebral infarction, unspecified: Secondary | ICD-10-CM | POA: Insufficient documentation

## 2020-11-06 DIAGNOSIS — R262 Difficulty in walking, not elsewhere classified: Secondary | ICD-10-CM | POA: Diagnosis present

## 2020-11-06 DIAGNOSIS — R531 Weakness: Secondary | ICD-10-CM | POA: Insufficient documentation

## 2020-11-06 DIAGNOSIS — R278 Other lack of coordination: Secondary | ICD-10-CM | POA: Insufficient documentation

## 2020-11-06 DIAGNOSIS — R2681 Unsteadiness on feet: Secondary | ICD-10-CM | POA: Insufficient documentation

## 2020-11-06 DIAGNOSIS — I69351 Hemiplegia and hemiparesis following cerebral infarction affecting right dominant side: Secondary | ICD-10-CM | POA: Insufficient documentation

## 2020-11-06 DIAGNOSIS — R2689 Other abnormalities of gait and mobility: Secondary | ICD-10-CM | POA: Diagnosis present

## 2020-11-06 DIAGNOSIS — R269 Unspecified abnormalities of gait and mobility: Secondary | ICD-10-CM

## 2020-11-06 NOTE — Therapy (Signed)
[place] MAIN [facility] SERVICES [address] [place], [place], [address] Phone: [phone]   Fax:  [phone]  Physical Therapy Treatment  Patient Details  Name: Curtis Powell MRN: [id number] Date of Birth: 09/13/1965 Referring Provider (PT): Kym Groom MD   Encounter Date: 11/06/2020   PT End of Session - 11/06/20 1452     Visit Number 7    Number of Visits 17    Date for PT Re-Evaluation 11/29/20    Authorization Type start of care 10/04/20    PT Start Time 1430    PT Stop Time 1515    PT Time Calculation (min) 45 min    Equipment Utilized During Treatment Gait belt    Activity Tolerance Patient tolerated treatment well    Behavior During Therapy WFL for tasks assessed/performed             Past Medical History:  Diagnosis Date   GERD (gastroesophageal reflux disease)    Hyperlipidemia    Hypertension    Obesity    Stroke [facility])     Past Surgical History:  Procedure Laterality Date   BRAIN SURGERY      There were no vitals filed for this visit.   Subjective Assessment - 11/06/20 1448     Subjective Patient states he is feeling pretty good today- reports he is trying to walk some everyday at home.    Pertinent History Patient is a pleasant 55 year old male who presents to physical therapy for weakness and immobility secondary to CVA.  Had a stroke in Feb 2018. He was previously fully independent, but this stroke caused severe residual deficits, mainly on the right side. He reports difficulty walking and decreased mobility. His mom had to move in with him and now is his main caregiver.  Patient had outpatient PT from November 2018-March 2022. Patient reports since being discharged from outpatient therapy he has kept up with his HEP daily. He does report desire for walking better and for getting in and out of bed easier. He reports his mom is helping with some ADLs. He reports needing help with rolling in bed. He reports his mom/aide  assist with dressing. He has a home aide 3 days out of the week;    Limitations Standing;Walking    How long can you sit comfortably? NA    How long can you stand comfortably? <30 min, does minimal standing;    How long can you walk comfortably? depends on the day, fatigue affects his walking; He is walking every day, about 15-20 min;    Diagnostic tests None recent    Patient Stated Goals to walk better and be independent in bed mobility;    Currently in Pain? No/denies               Interventions: Therapeutic Activities:  Assist to restroom to perform toileting: Patient required min assist to pull paints up/down while he was holding onto grab bar. Patient was able to utilize one hand to assist to negotiating pants and  stand no loss of balance.  *Patient then required assistance again at end of visit for 2nd bathroom trip.   Therapeutic exercises:   In parallel bars:  Static stand with alternating shoulder elevation while opp UE min touch for support- all with CGA x 12 reps each- No LOB yet Increased VC to maintain erect posture.   Static stand without UE support x 1 min 50 sec (VC for erect posture) and another  trial of 1 min 20 sec.   Standing B hip march using YTB x 12 reps (VC for increased height)   Standing in // bars- Holding onto PVC and performing bilateral Shoulder flex.   Standing - horizontal shoulder abd x 12 reps. Patient able to perform without LOB- Yet reported fatigue after standing.   Ambulation= 100 feet today at end of session. Patient utilized pediatric bariatric RW- Exhibiting short step to gait mostly with occasional ability to progress to short reciprocal steps. VC to maintain his posture and remain close to walker. Patient limited today by fatigue per his report.   Education provided throughout session via VC/TC and demonstration to facilitate movement at target joints and correct muscle activation for all testing and exercises performed.   Clinical  Impression: Patient able to progress with overall standing endurance today and to participate well with specific standing activities. He continues to require VC for posture and gait sequencing for optimal safety with gait/standing activities. He was limited by fatigue today during visit. Patient will benefit from skilled physical therapy to increase strength, mobility, and stability for decreased fall risk and improved ADL ability                        PT Education - 11/06/20 1449     Education Details specific exercise/posture technique    Person(s) Educated Patient    Methods Explanation;Demonstration;Tactile cues;Handout    Comprehension Verbalized understanding;Returned demonstration;Verbal cues required;Tactile cues required;Need further instruction              PT Short Term Goals - 10/04/20 1218       PT SHORT TERM GOAL #1   Title Patient will be adherent to HEP at least 3x a week to improve functional strength and balance for better safety at home.    Time 4    Period Weeks    Status New    Target Date 11/01/20      PT SHORT TERM GOAL #2   Title Patient will be independent in bed mobility including sit to supine utilizing bed rails as needed for increased mobility at home.    Baseline 6/29: requires min A    Time 4    Period Weeks    Status New    Target Date 11/01/20               PT Long Term Goals - 10/04/20 1219       PT LONG TERM GOAL #1   Title Patient will improve FOTO score to > 50% to indicate improve functional mobility at home and in community.    Baseline 6/29: 42%    Time 8    Period Weeks    Status New    Target Date 11/29/20      PT LONG TERM GOAL #2   Title Patient (< 74 years old) will complete five times sit to stand test in < 20 seconds indicating an increased LE strength and improved balance.    Baseline 6/29: 23.8 sec with 2HHA    Time 8    Period Weeks    Status New    Target Date 11/29/20      PT LONG TERM  GOAL #3   Title Patient will increase 10 meter walk test to >0.15 m/s (60 sec or less) as to improve gait speed for better home ambulation and to reduce fall risk.    Baseline 6/29: 0.08 m/s with RW  Time 8    Period Weeks    Status New    Target Date 11/29/20      PT LONG TERM GOAL #4   Title Patient will increase BLE gross strength to 4+/5 as to improve functional strength for independent gait, increased standing tolerance and increased ADL ability.    Baseline 6/29: see flowsheet    Time 8    Period Weeks    Status New    Target Date 11/29/20      PT LONG TERM GOAL #5   Title Patient will ambulate 250 ft with least assistive device and Supervision with no breaks to allow for increased mobility within home.    Time 8    Period Weeks    Status New    Target Date 11/29/20                   Plan - 11/06/20 1453     Clinical Impression Statement Patient able to progress with overall standing endurance today and to participate well with specific standing activities. He continues to require VC for posture and gait sequencing for optimal safety with gait/standing activities. He was limited by fatigue today during visit. Patient will benefit from skilled physical therapy to increase strength, mobility, and stability for decreased fall risk and improved ADL ability    Personal Factors and Comorbidities Comorbidity 3+;Education;Past/Current Experience;Time since onset of injury/illness/exacerbation;Transportation    Comorbidities HTN, arthritis, past CVA, high fall risk, obesity    Examination-Activity Limitations Bed Mobility;Locomotion Level;Squat;Stairs;Stand;Toileting;Transfers    Examination-Participation Restrictions Community Activity;Driving;Cleaning;Meal Prep;Shop;Volunteer;Yard Work    Stability/Clinical Decision Making Stable/Uncomplicated    Rehab Potential Good    PT Frequency 2x / week    PT Duration 8 weeks    PT Treatment/Interventions ADLs/Self Care Home  Management;Cryotherapy;Moist Heat;Gait training;Stair training;Functional mobility training;Therapeutic activities;Therapeutic exercise;Balance training;Neuromuscular re-education;Patient/family education;Orthotic Fit/Training;Passive range of motion;Energy conservation    PT Next Visit Plan Continue with progressive ambulation, transfer techniques, progressive LE strengthening    PT Home Exercise Plan no updates  this session;    Consulted and Agree with Plan of Care Patient             Patient will benefit from skilled therapeutic intervention in order to improve the following deficits and impairments:  Abnormal gait, Decreased balance, Decreased endurance, Decreased mobility, Difficulty walking, Hypomobility, Obesity, Improper body mechanics, Decreased activity tolerance, Decreased strength, Postural dysfunction  Visit Diagnosis: Abnormality of gait and mobility  Difficulty in walking, not elsewhere classified  Muscle weakness (generalized)  Other lack of coordination  Unsteadiness on feet     Problem List Patient Active Problem List   Diagnosis Date Noted   Impaired glucose tolerance 03/23/2020   Learning disability 03/23/2020   Urinary and fecal incontinence 06/14/2019   Chronic pain syndrome 03/23/2019   Constipation 03/23/2019   Dysarthria 03/23/2019   Dysphagia 03/23/2019   Learning difficulty 03/23/2019   Morbid obesity ([place]) 03/23/2019   Muscle weakness 03/23/2019   Vitamin D deficiency 03/23/2019   Bilateral carotid artery stenosis 12/15/2018   Moderate aortic valve stenosis 12/15/2018   BMI 45.0-49.9, adult ([place]) 11/10/2018   Cerebrovascular accident ([place]) 05/02/2016   Hemiparesis affecting right side as late effect of cerebrovascular accident ([place]) 05/01/2016   Brain tumor ([place]) 11/24/2014   Gastro-esophageal reflux disease without esophagitis 11/24/2014   Accident due to mechanical fall without injury 08/03/2014   Essential hypertension 06/13/2014    Mixed hyperlipidemia 06/13/2014   Unspecified sequelae of cerebral infarction 06/13/2014  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 [place], [place], [address] Phone: [phone]   Fax:  [phone]  Name: GAYNOR UTSEY MRN: [id number] Date of Birth: 09-06-1965

## 2020-11-08 ENCOUNTER — Other Ambulatory Visit: Payer: Self-pay

## 2020-11-08 ENCOUNTER — Ambulatory Visit: Payer: Medicare Other

## 2020-11-08 DIAGNOSIS — R269 Unspecified abnormalities of gait and mobility: Secondary | ICD-10-CM | POA: Diagnosis not present

## 2020-11-08 DIAGNOSIS — M6281 Muscle weakness (generalized): Secondary | ICD-10-CM

## 2020-11-08 DIAGNOSIS — R262 Difficulty in walking, not elsewhere classified: Secondary | ICD-10-CM

## 2020-11-08 NOTE — Therapy (Signed)
[place] MAIN [facility] SERVICES [address] [place], [place], [address] Phone: [phone]   Fax:  [phone]  Physical Therapy Treatment  Patient Details  Name: Curtis Powell MRN: [id number] Date of Birth: 10/08/1965 Referring Provider (PT): Kym Groom MD   Encounter Date: 11/08/2020   PT End of Session - 11/08/20 1245     Visit Number 8    Number of Visits 17    Date for PT Re-Evaluation 11/29/20    Authorization Type start of care 10/04/20    PT Start Time [id number]    PT Stop Time 1344    PT Time Calculation (min) 45 min    Equipment Utilized During Treatment Gait belt    Activity Tolerance Patient tolerated treatment well    Behavior During Therapy WFL for tasks assessed/performed             Past Medical History:  Diagnosis Date   GERD (gastroesophageal reflux disease)    Hyperlipidemia    Hypertension    Obesity    Stroke [facility])     Past Surgical History:  Procedure Laterality Date   BRAIN SURGERY      There were no vitals filed for this visit.   Subjective Assessment - 11/08/20 1313     Subjective Patient reports he had an accident on his way to PT today, didn't make it to the restroom in time.    Pertinent History Patient is a pleasant 55 year old male who presents to physical therapy for weakness and immobility secondary to CVA.  Had a stroke in Feb 2018. He was previously fully independent, but this stroke caused severe residual deficits, mainly on the right side. He reports difficulty walking and decreased mobility. His mom had to move in with him and now is his main caregiver.  Patient had outpatient PT from November 2018-March 2022. Patient reports since being discharged from outpatient therapy he has kept up with his HEP daily. He does report desire for walking better and for getting in and out of bed easier. He reports his mom is helping with some ADLs. He reports needing help with rolling in bed. He reports his mom/aide  assist with dressing. He has a home aide 3 days out of the week;    Limitations Standing;Walking    How long can you sit comfortably? NA    How long can you stand comfortably? <30 min, does minimal standing;    How long can you walk comfortably? depends on the day, fatigue affects his walking; He is walking every day, about 15-20 min;    Diagnostic tests None recent    Patient Stated Goals to walk better and be independent in bed mobility;    Currently in Pain? No/denies               Interventions: Therapeutic Activities:  Assist to restroom to perform toileting: Patient required min assist to pull paints up/down while he was holding onto grab bar. Patient was able to utilize one hand to assist to negotiating pants and  stand no loss of balance.    Therapeutic exercises:    In parallel bars:   Standing  hip flexion into YTB across bars.  x 15 reps each LE (VC for increased height)   Lateral stepping 2x length of // bars with cues for upright posture   Posterior ambulation 2x length of // bars, cues for hand placement and increasing step length.   High knee march 2x length  of // bars with BUE support, very challenging for foot clearance.    Ambulation= 160 feet today at end of session. Patient utilized pediatric bariatric RW- Exhibiting short step to gait mostly with occasional ability to progress to short reciprocal steps. VC to maintain his posture and remain close to walker. Patient has one seated rest break due to increased external rotation of R foot as he fatigued.    Education provided throughout session via VC/TC and demonstration to facilitate movement at target joints and correct muscle activation for all testing and exercises performed.     Patient tolerates ambulation after standing training however as he fatigued his right LE began everting and rolling requiring PT to request patient to stop and rest. Patient's gait improved after rest break indicating fatigue  related deficit. Patient does continue to require assistance with toileting with donning/doffing clothing.  Patient will benefit from skilled physical therapy to increase strength, mobility, and stability for decreased fall risk and improved ADL ability                     PT Education - 11/08/20 1248     Education Details exercise technique, body mechanics.    Person(s) Educated Patient    Methods Explanation;Demonstration;Tactile cues;Verbal cues    Comprehension Verbalized understanding;Returned demonstration;Verbal cues required;Tactile cues required              PT Short Term Goals - 10/04/20 1218       PT SHORT TERM GOAL #1   Title Patient will be adherent to HEP at least 3x a week to improve functional strength and balance for better safety at home.    Time 4    Period Weeks    Status New    Target Date 11/01/20      PT SHORT TERM GOAL #2   Title Patient will be independent in bed mobility including sit to supine utilizing bed rails as needed for increased mobility at home.    Baseline 6/29: requires min A    Time 4    Period Weeks    Status New    Target Date 11/01/20               PT Long Term Goals - 10/04/20 1219       PT LONG TERM GOAL #1   Title Patient will improve FOTO score to > 50% to indicate improve functional mobility at home and in community.    Baseline 6/29: 42%    Time 8    Period Weeks    Status New    Target Date 11/29/20      PT LONG TERM GOAL #2   Title Patient (< 71 years old) will complete five times sit to stand test in < 20 seconds indicating an increased LE strength and improved balance.    Baseline 6/29: 23.8 sec with 2HHA    Time 8    Period Weeks    Status New    Target Date 11/29/20      PT LONG TERM GOAL #3   Title Patient will increase 10 meter walk test to >0.15 m/s (60 sec or less) as to improve gait speed for better home ambulation and to reduce fall risk.    Baseline 6/29: 0.08 m/s with RW     Time 8    Period Weeks    Status New    Target Date 11/29/20      PT LONG TERM GOAL #4  Title Patient will increase BLE gross strength to 4+/5 as to improve functional strength for independent gait, increased standing tolerance and increased ADL ability.    Baseline 6/29: see flowsheet    Time 8    Period Weeks    Status New    Target Date 11/29/20      PT LONG TERM GOAL #5   Title Patient will ambulate 250 ft with least assistive device and Supervision with no breaks to allow for increased mobility within home.    Time 8    Period Weeks    Status New    Target Date 11/29/20                   Plan - 11/08/20 1608     Clinical Impression Statement Patient tolerates ambulation after standing training however as he fatigued his right LE began everting and rolling requiring PT to request patient to stop and rest. Patient's gait improved after rest break indicating fatigue related deficit. Patient does continue to require assistance with toileting with donning/doffing clothing.  Patient will benefit from skilled physical therapy to increase strength, mobility, and stability for decreased fall risk and improved ADL ability    Personal Factors and Comorbidities Comorbidity 3+;Education;Past/Current Experience;Time since onset of injury/illness/exacerbation;Transportation    Comorbidities HTN, arthritis, past CVA, high fall risk, obesity    Examination-Activity Limitations Bed Mobility;Locomotion Level;Squat;Stairs;Stand;Toileting;Transfers    Examination-Participation Restrictions Community Activity;Driving;Cleaning;Meal Prep;Shop;Volunteer;Yard Work    Stability/Clinical Decision Making Stable/Uncomplicated    Rehab Potential Good    PT Frequency 2x / week    PT Duration 8 weeks    PT Treatment/Interventions ADLs/Self Care Home Management;Cryotherapy;Moist Heat;Gait training;Stair training;Functional mobility training;Therapeutic activities;Therapeutic exercise;Balance  training;Neuromuscular re-education;Patient/family education;Orthotic Fit/Training;Passive range of motion;Energy conservation    PT Next Visit Plan Continue with progressive ambulation, transfer techniques, progressive LE strengthening    PT Home Exercise Plan no updates  this session;    Consulted and Agree with Plan of Care Patient             Patient will benefit from skilled therapeutic intervention in order to improve the following deficits and impairments:  Abnormal gait, Decreased balance, Decreased endurance, Decreased mobility, Difficulty walking, Hypomobility, Obesity, Improper body mechanics, Decreased activity tolerance, Decreased strength, Postural dysfunction  Visit Diagnosis: Abnormality of gait and mobility  Difficulty in walking, not elsewhere classified  Muscle weakness (generalized)     Problem List Patient Active Problem List   Diagnosis Date Noted   Impaired glucose tolerance 03/23/2020   Learning disability 03/23/2020   Urinary and fecal incontinence 06/14/2019   Chronic pain syndrome 03/23/2019   Constipation 03/23/2019   Dysarthria 03/23/2019   Dysphagia 03/23/2019   Learning difficulty 03/23/2019   Morbid obesity ([place]) 03/23/2019   Muscle weakness 03/23/2019   Vitamin D deficiency 03/23/2019   Bilateral carotid artery stenosis 12/15/2018   Moderate aortic valve stenosis 12/15/2018   BMI 45.0-49.9, adult ([place]) 11/10/2018   Cerebrovascular accident ([place]) 05/02/2016   Hemiparesis affecting right side as late effect of cerebrovascular accident ([place]) 05/01/2016   Brain tumor ([place]) 11/24/2014   Gastro-esophageal reflux disease without esophagitis 11/24/2014   Accident due to mechanical fall without injury 08/03/2014   Essential hypertension 06/13/2014   Mixed hyperlipidemia 06/13/2014   Unspecified sequelae of cerebral infarction 06/13/2014   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 [place], [place], [address] Phone: [phone]   Fax:  [phone]  Name: Curtis Powell MRN: [id number] Date of Birth: Jun 06, 1965

## 2020-11-13 ENCOUNTER — Ambulatory Visit: Payer: Medicare Other

## 2020-11-13 ENCOUNTER — Other Ambulatory Visit: Payer: Self-pay

## 2020-11-13 DIAGNOSIS — R262 Difficulty in walking, not elsewhere classified: Secondary | ICD-10-CM

## 2020-11-13 DIAGNOSIS — M6281 Muscle weakness (generalized): Secondary | ICD-10-CM

## 2020-11-13 DIAGNOSIS — R2681 Unsteadiness on feet: Secondary | ICD-10-CM

## 2020-11-13 DIAGNOSIS — R269 Unspecified abnormalities of gait and mobility: Secondary | ICD-10-CM

## 2020-11-13 NOTE — Therapy (Signed)
[place] MAIN [facility] SERVICES [address] [place], [place], [address] Phone: [phone]   Fax:  [phone]  Physical Therapy Treatment  Patient Details  Name: Curtis Powell MRN: [id number] Date of Birth: 1965-08-27 Referring Provider (PT): Kym Groom MD   Encounter Date: 11/13/2020   PT End of Session - 11/13/20 1455     Visit Number 9    Number of Visits 17    Date for PT Re-Evaluation 11/29/20    Authorization Type start of care 10/04/20    PT Start Time 1344    PT Stop Time 1440    PT Time Calculation (min) 56 min    Equipment Utilized During Treatment Gait belt    Activity Tolerance Patient tolerated treatment well    Behavior During Therapy WFL for tasks assessed/performed             Past Medical History:  Diagnosis Date   GERD (gastroesophageal reflux disease)    Hyperlipidemia    Hypertension    Obesity    Stroke [facility])     Past Surgical History:  Procedure Laterality Date   BRAIN SURGERY      There were no vitals filed for this visit.   Subjective Assessment - 11/13/20 1453     Subjective Patient reports he had a faire weekend- "Didn't do much- mostly watch TV and I need to go to bathroom as I had another accident on his way to PT today- didn't make it to the restroom in time again."    Pertinent History Patient is a pleasant 55 year old male who presents to physical therapy for weakness and immobility secondary to CVA.  Had a stroke in Feb 2018. He was previously fully independent, but this stroke caused severe residual deficits, mainly on the right side. He reports difficulty walking and decreased mobility. His mom had to move in with him and now is his main caregiver.  Patient had outpatient PT from November 2018-March 2022. Patient reports since being discharged from outpatient therapy he has kept up with his HEP daily. He does report desire for walking better and for getting in and out of bed easier. He reports his  mom is helping with some ADLs. He reports needing help with rolling in bed. He reports his mom/aide assist with dressing. He has a home aide 3 days out of the week;    Limitations Standing;Walking    How long can you sit comfortably? NA    How long can you stand comfortably? <30 min, does minimal standing;    How long can you walk comfortably? depends on the day, fatigue affects his walking; He is walking every day, about 15-20 min;    Diagnostic tests None recent    Patient Stated Goals to walk better and be independent in bed mobility;    Currently in Pain? No/denies               Interventions: Therapeutic Activities: Upon arrival- Assist to restroom to perform toileting to change pull up: Patient required min assist to pull shorts/pull up up/down while he was holding onto grab bar. Patient was able to utilize one hand to assist to negotiating shorts and maintain standing without significant unsteadiness yet no loss of balance.  *Patient required another bathroom trip at end of session- He was able to walk into bathroom with walker right over toilet- lift seat and pull down shorts with 1 UE with CGA  yet required min assist to  negotiate pull up and shorts.     Therapeutic exercises:    At support bar:   Static standing without UE support - focusing on B LE placement- shoulder width apart and attaining center of mass- patient able to perform 2 trials today- 1) 1 min 19 sec with VC, TC for erect posture.  2) 4 min with engaging patient in conversation to distract him from standing.   Gait training:     Ambulation= 77 feet today at end of session. Patient utilized pediatric bariatric RW, CGA, with w/c follow- Exhibiting short reciprocal steps with VC to maintain his posture and remain close to walker. Measured Gait speed = 0.098 m/s. Patient able to clear each foot without evidence of increased eversion/rolling today.    Education provided throughout session via VC/TC and  demonstration to facilitate movement at target joints and correct muscle activation for all testing and exercises performed.     Clinical Impression: Treatment focused on toileting ability (x 2 trips today) and patient was able to walk into bathroom on 2nd trial and negotiate the toilet seat and able to pull down shorts/pull up with 1 UE today. He was able to drastically improve his overall standing endurance without UE support today as well.   Patient will benefit from skilled physical therapy to increase strength, mobility, and stability for decreased fall risk and improved ADL ability                            PT Education - 11/13/20 1454     Education Details Specific standing postural education; Gait sequencing for improved functional mobility    Person(s) Educated Patient    Methods Explanation;Demonstration;Tactile cues;Verbal cues    Comprehension Verbalized understanding;Returned demonstration;Verbal cues required;Tactile cues required;Need further instruction              PT Short Term Goals - 10/04/20 1218       PT SHORT TERM GOAL #1   Title Patient will be adherent to HEP at least 3x a week to improve functional strength and balance for better safety at home.    Time 4    Period Weeks    Status New    Target Date 11/01/20      PT SHORT TERM GOAL #2   Title Patient will be independent in bed mobility including sit to supine utilizing bed rails as needed for increased mobility at home.    Baseline 6/29: requires min A    Time 4    Period Weeks    Status New    Target Date 11/01/20               PT Long Term Goals - 10/04/20 1219       PT LONG TERM GOAL #1   Title Patient will improve FOTO score to > 50% to indicate improve functional mobility at home and in community.    Baseline 6/29: 42%    Time 8    Period Weeks    Status New    Target Date 11/29/20      PT LONG TERM GOAL #2   Title Patient (< 6 years old) will complete  five times sit to stand test in < 20 seconds indicating an increased LE strength and improved balance.    Baseline 6/29: 23.8 sec with 2HHA    Time 8    Period Weeks    Status New    Target Date 11/29/20  PT LONG TERM GOAL #3   Title Patient will increase 10 meter walk test to >0.15 m/s (60 sec or less) as to improve gait speed for better home ambulation and to reduce fall risk.    Baseline 6/29: 0.08 m/s with RW    Time 8    Period Weeks    Status New    Target Date 11/29/20      PT LONG TERM GOAL #4   Title Patient will increase BLE gross strength to 4+/5 as to improve functional strength for independent gait, increased standing tolerance and increased ADL ability.    Baseline 6/29: see flowsheet    Time 8    Period Weeks    Status New    Target Date 11/29/20      PT LONG TERM GOAL #5   Title Patient will ambulate 250 ft with least assistive device and Supervision with no breaks to allow for increased mobility within home.    Time 8    Period Weeks    Status New    Target Date 11/29/20                   Plan - 11/13/20 1456     Clinical Impression Statement Treatment focused on toileting ability (x 2 trips today) and patient was able to walk into bathroom on 2nd trial and negotiate the toilet seat and able to pull down shorts/pull up with 1 UE today. He was able to drastically improve his overall standing endurance without UE support today as well.   Patient will benefit from skilled physical therapy to increase strength, mobility, and stability for decreased fall risk and improved ADL ability  ?    Personal Factors and Comorbidities Comorbidity 3+;Education;Past/Current Experience;Time since onset of injury/illness/exacerbation;Transportation    Comorbidities HTN, arthritis, past CVA, high fall risk, obesity    Examination-Activity Limitations Bed Mobility;Locomotion Level;Squat;Stairs;Stand;Toileting;Transfers    Examination-Participation Restrictions Community  Activity;Driving;Cleaning;Meal Prep;Shop;Volunteer;Yard Work    Stability/Clinical Decision Making Stable/Uncomplicated    Rehab Potential Good    PT Frequency 2x / week    PT Duration 8 weeks    PT Treatment/Interventions ADLs/Self Care Home Management;Cryotherapy;Moist Heat;Gait training;Stair training;Functional mobility training;Therapeutic activities;Therapeutic exercise;Balance training;Neuromuscular re-education;Patient/family education;Orthotic Fit/Training;Passive range of motion;Energy conservation    PT Next Visit Plan Continue with progressive ambulation, transfer techniques, progressive LE strengthening    PT Home Exercise Plan no updates  this session; Reminder to patient to continue with daily walking    Consulted and Agree with Plan of Care Patient             Patient will benefit from skilled therapeutic intervention in order to improve the following deficits and impairments:  Abnormal gait, Decreased balance, Decreased endurance, Decreased mobility, Difficulty walking, Hypomobility, Obesity, Improper body mechanics, Decreased activity tolerance, Decreased strength, Postural dysfunction  Visit Diagnosis: Abnormality of gait and mobility  Difficulty in walking, not elsewhere classified  Muscle weakness (generalized)  Unsteadiness on feet     Problem List Patient Active Problem List   Diagnosis Date Noted   Impaired glucose tolerance 03/23/2020   Learning disability 03/23/2020   Urinary and fecal incontinence 06/14/2019   Chronic pain syndrome 03/23/2019   Constipation 03/23/2019   Dysarthria 03/23/2019   Dysphagia 03/23/2019   Learning difficulty 03/23/2019   Morbid obesity ([place]) 03/23/2019   Muscle weakness 03/23/2019   Vitamin D deficiency 03/23/2019   Bilateral carotid artery stenosis 12/15/2018   Moderate aortic valve stenosis 12/15/2018   BMI 45.0-49.9, adult (  [place]) 11/10/2018   Cerebrovascular accident ([place]) 05/02/2016   Hemiparesis affecting right  side as late effect of cerebrovascular accident ([place]) 05/01/2016   Brain tumor ([place]) 11/24/2014   Gastro-esophageal reflux disease without esophagitis 11/24/2014   Accident due to mechanical fall without injury 08/03/2014   Essential hypertension 06/13/2014   Mixed hyperlipidemia 06/13/2014   Unspecified sequelae of cerebral infarction 06/13/2014   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 [place], [place], [address] Phone: [phone]   Fax:  [phone]  Name: ALONDO AUDET MRN: [id number] Date of Birth: 17-Jan-1966

## 2020-11-15 ENCOUNTER — Other Ambulatory Visit: Payer: Self-pay

## 2020-11-15 ENCOUNTER — Ambulatory Visit: Payer: Medicare Other

## 2020-11-15 DIAGNOSIS — R2681 Unsteadiness on feet: Secondary | ICD-10-CM

## 2020-11-15 DIAGNOSIS — R269 Unspecified abnormalities of gait and mobility: Secondary | ICD-10-CM | POA: Diagnosis not present

## 2020-11-15 DIAGNOSIS — M6281 Muscle weakness (generalized): Secondary | ICD-10-CM

## 2020-11-15 DIAGNOSIS — R262 Difficulty in walking, not elsewhere classified: Secondary | ICD-10-CM

## 2020-11-15 NOTE — Therapy (Signed)
[place] MAIN [facility] SERVICES [address] [place], [place], [address] Phone: [phone]   Fax:  [phone]  Physical Therapy Treatment/Physical Therapy Progress Note   Dates of reporting period 10/04/2020  to  11/15/2020  Patient Details  Name: Curtis Powell MRN: [id number] Date of Birth: [birth year] Referring Provider (PT): Kym Groom MD   Encounter Date: 11/15/2020   PT End of Session - 11/15/20 1306     Visit Number 10    Number of Visits 17    Date for PT Re-Evaluation 11/29/20    Authorization Type start of care 10/04/20    Authorization Time Period PN on 11/15/2020    PT Start Time 1301    PT Stop Time 1345    PT Time Calculation (min) 44 min    Equipment Utilized During Treatment Gait belt    Activity Tolerance Patient tolerated treatment well    Behavior During Therapy WFL for tasks assessed/performed             Past Medical History:  Diagnosis Date   GERD (gastroesophageal reflux disease)    Hyperlipidemia    Hypertension    Obesity    Stroke [facility])     Past Surgical History:  Procedure Laterality Date   BRAIN SURGERY      There were no vitals filed for this visit.   Subjective Assessment - 11/15/20 1304     Subjective Patient reports he was not too sore after last visit and feeling pretty good today.    Pertinent History Patient is a pleasant 55 year old male who presents to physical therapy for weakness and immobility secondary to CVA.  Had a stroke in Feb 2018. He was previously fully independent, but this stroke caused severe residual deficits, mainly on the right side. He reports difficulty walking and decreased mobility. His mom had to move in with him and now is his main caregiver.  Patient had outpatient PT from November 2018-March 2022. Patient reports since being discharged from outpatient therapy he has kept up with his HEP daily. He does report desire for walking better and for getting in and out of bed  easier. He reports his mom is helping with some ADLs. He reports needing help with rolling in bed. He reports his mom/aide assist with dressing. He has a home aide 3 days out of the week;    Limitations Standing;Walking    How long can you sit comfortably? NA    How long can you stand comfortably? <30 min, does minimal standing;    How long can you walk comfortably? depends on the day, fatigue affects his walking; He is walking every day, about 15-20 min;    Diagnostic tests None recent    Patient Stated Goals to walk better and be independent in bed mobility;                Interventions:  Reassessed STG and LTGs today:   Short term goal-  HEP compliance- Patient reports performing seated LE hip march, knee ext, sit to stand and walking daily and states no questions. Advised patient to practice static standing using his walker or kitchen counter several times per day without UE support.   FOTO- Not assessed this visit Bed mobility sit to supine with railing- Will assess next visit 5x STS- 48 sec with BUE Support 10 MWT- 0.093 m/s  Ambulation distance = approx 110 feet using pediatric bariatric walker  BLE strength= 4-/5 B hip flex/abd, 4/5  B knee flex/ext   Clinical Impression: Patient presents with some functional improvement with gait speed yet scored significantly lower on functional LE strength and overall gait distance. Patient denied any pain and states he was just tired today. He remains motivated and able to follow VC and visual demonstration. He has met his STG and familiar with current HEP. He will continue to benefit from skilled PT services to focus on his long term goals. Patient's condition has the potential to improve in response to therapy. Maximum improvement is yet to be obtained. The anticipated improvement is attainable and reasonable in a generally predictable time.                          PT Education - 11/15/20 1305     Education  Details Purpose of functional outcome testing.    Person(s) Educated Patient    Methods Explanation;Demonstration;Tactile cues;Verbal cues    Comprehension Verbalized understanding;Returned demonstration;Verbal cues required;Tactile cues required;Need further instruction              PT Short Term Goals - 11/15/20 1310       PT SHORT TERM GOAL #1   Title Patient will be adherent to HEP at least 3x a week to improve functional strength and balance for better safety at home.    Baseline 11/15/2020- Patient reports performing seated LE hip march, knee ext, sit to stand and walking daily and states no questions. Advised patient to practice static standing using his walker or kitchen counter several times per day without UE support.    Time 4    Period Weeks    Status Achieved    Target Date 11/01/20      PT SHORT TERM GOAL #2   Title Patient will be independent in bed mobility including sit to supine utilizing bed rails as needed for increased mobility at home.    Baseline 6/29: requires min A    Time 4    Period Weeks    Status New    Target Date 11/01/20               PT Long Term Goals - 11/15/20 1324       PT LONG TERM GOAL #1   Title Patient will improve FOTO score to > 50% to indicate improve functional mobility at home and in community.    Baseline 6/29: 42%    Time 8    Period Weeks    Status On-going    Target Date 11/29/20      PT LONG TERM GOAL #2   Title Patient (< 54 years old) will complete five times sit to stand test in < 20 seconds indicating an increased LE strength and improved balance.    Baseline 6/29: 23.8 sec with 2HHA. 11/15/2020= 48 sec with BUE Support and CGA using gait belt.    Time 8    Period Weeks    Status On-going    Target Date 11/29/20      PT LONG TERM GOAL #3   Title Patient will increase 10 meter walk test to >0.15 m/s (60 sec or less) as to improve gait speed for better home ambulation and to reduce fall risk.    Baseline  6/29: 0.08 m/s with RW. 11/15/2020= 0.093 m/s with RW    Time 8    Period Weeks    Status On-going    Target Date 11/29/20      PT LONG  TERM GOAL #4   Title Patient will increase BLE gross strength to 4+/5 as to improve functional strength for independent gait, increased standing tolerance and increased ADL ability.    Baseline 6/29: see flowsheet. 11/15/2020    Time 8    Period Weeks    Status On-going    Target Date 11/29/20      PT LONG TERM GOAL #5   Title Patient will ambulate 250 ft with least assistive device and Supervision with no breaks to allow for increased mobility within home.    Baseline 11/15/2020-Ambulation distance = approx 110 feet using pediatric bariatric walker    Time 8    Period Weeks    Status On-going    Target Date 11/29/20                   Plan - 11/15/20 1653     Clinical Impression Statement Patient presents with some functional improvement with gait speed yet scored significantly lower on functional LE strength and overall gait distance. Patient denied any pain and states he was just tired today. He remains motivated and able to follow VC and visual demonstration. He has met his STG and familiar with current HEP. He will continue to benefit from skilled PT services to focus on his long term goals. Patient's condition has the potential to improve in response to therapy. Maximum improvement is yet to be obtained. The anticipated improvement is attainable and reasonable in a generally predictable time.    Personal Factors and Comorbidities Comorbidity 3+;Education;Past/Current Experience;Time since onset of injury/illness/exacerbation;Transportation    Comorbidities HTN, arthritis, past CVA, high fall risk, obesity    Examination-Activity Limitations Bed Mobility;Locomotion Level;Squat;Stairs;Stand;Toileting;Transfers    Examination-Participation Restrictions Community Activity;Driving;Cleaning;Meal Prep;Shop;Volunteer;Yard Work    Stability/Clinical  Decision Making Stable/Uncomplicated    Rehab Potential Good    PT Frequency 2x / week    PT Duration 8 weeks    PT Treatment/Interventions ADLs/Self Care Home Management;Cryotherapy;Moist Heat;Gait training;Stair training;Functional mobility training;Therapeutic activities;Therapeutic exercise;Balance training;Neuromuscular re-education;Patient/family education;Orthotic Fit/Training;Passive range of motion;Energy conservation    PT Next Visit Plan Continue with progressive ambulation, transfer techniques, progressive LE strengthening    PT Home Exercise Plan no updates  this session; Reminder to patient to continue with daily walking    Consulted and Agree with Plan of Care Patient             Patient will benefit from skilled therapeutic intervention in order to improve the following deficits and impairments:  Abnormal gait, Decreased balance, Decreased endurance, Decreased mobility, Difficulty walking, Hypomobility, Obesity, Improper body mechanics, Decreased activity tolerance, Decreased strength, Postural dysfunction  Visit Diagnosis: Abnormality of gait and mobility  Difficulty in walking, not elsewhere classified  Muscle weakness (generalized)  Unsteadiness on feet     Problem List Patient Active Problem List   Diagnosis Date Noted   Impaired glucose tolerance 03/23/2020   Learning disability 03/23/2020   Urinary and fecal incontinence 06/14/2019   Chronic pain syndrome 03/23/2019   Constipation 03/23/2019   Dysarthria 03/23/2019   Dysphagia 03/23/2019   Learning difficulty 03/23/2019   Morbid obesity ([place]) 03/23/2019   Muscle weakness 03/23/2019   Vitamin D deficiency 03/23/2019   Bilateral carotid artery stenosis 12/15/2018   Moderate aortic valve stenosis 12/15/2018   BMI 45.0-49.9, adult ([place]) 11/10/2018   Cerebrovascular accident ([place]) 05/02/2016   Hemiparesis affecting right side as late effect of cerebrovascular accident ([place]) 05/01/2016   Brain tumor  ([place]) 11/24/2014   Gastro-esophageal reflux disease without esophagitis  11/24/2014   Accident due to mechanical fall without injury 08/03/2014   Essential hypertension 06/13/2014   Mixed hyperlipidemia 06/13/2014   Unspecified sequelae of cerebral infarction 06/13/2014   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 [place], [place], [address] Phone: [phone]   Fax:  [phone]  Name: LENORD FRALIX MRN: [id number] Date of Birth: [birth year]

## 2020-11-20 ENCOUNTER — Ambulatory Visit: Payer: Medicare Other

## 2020-11-20 ENCOUNTER — Other Ambulatory Visit: Payer: Self-pay

## 2020-11-20 DIAGNOSIS — R269 Unspecified abnormalities of gait and mobility: Secondary | ICD-10-CM

## 2020-11-20 DIAGNOSIS — R2689 Other abnormalities of gait and mobility: Secondary | ICD-10-CM

## 2020-11-20 DIAGNOSIS — R262 Difficulty in walking, not elsewhere classified: Secondary | ICD-10-CM

## 2020-11-20 DIAGNOSIS — M6281 Muscle weakness (generalized): Secondary | ICD-10-CM

## 2020-11-20 DIAGNOSIS — R2681 Unsteadiness on feet: Secondary | ICD-10-CM

## 2020-11-20 NOTE — Therapy (Signed)
[place] MAIN [facility] SERVICES [address] [place], [place], [address] Phone: [phone]   Fax:  [phone]  Physical Therapy Treatment  Patient Details  Name: Curtis Powell MRN: [id number] Date of Birth: 26-Jan-1966 Referring Provider (PT): Kym Groom MD   Encounter Date: 11/20/2020   PT End of Session - 11/20/20 0826     Visit Number 11    Number of Visits 17    Date for PT Re-Evaluation 11/29/20    Authorization Type start of care 10/04/20    Authorization Time Period PN on 11/15/2020    PT Start Time 1345    PT Stop Time 1429    PT Time Calculation (min) 44 min    Equipment Utilized During Treatment Gait belt    Activity Tolerance Patient tolerated treatment well    Behavior During Therapy WFL for tasks assessed/performed             Past Medical History:  Diagnosis Date   GERD (gastroesophageal reflux disease)    Hyperlipidemia    Hypertension    Obesity    Stroke [facility])     Past Surgical History:  Procedure Laterality Date   BRAIN SURGERY      There were no vitals filed for this visit.   Subjective Assessment - 11/20/20 0824     Subjective Patient reports he had a quiet weekend and didn't do much other than watch TV and exercise.    Pertinent History Patient is a pleasant 55 year old male who presents to physical therapy for weakness and immobility secondary to CVA.  Had a stroke in Feb 2018. He was previously fully independent, but this stroke caused severe residual deficits, mainly on the right side. He reports difficulty walking and decreased mobility. His mom had to move in with him and now is his main caregiver.  Patient had outpatient PT from November 2018-March 2022. Patient reports since being discharged from outpatient therapy he has kept up with his HEP daily. He does report desire for walking better and for getting in and out of bed easier. He reports his mom is helping with some ADLs. He reports needing help with  rolling in bed. He reports his mom/aide assist with dressing. He has a home aide 3 days out of the week;    Limitations Standing;Walking    How long can you sit comfortably? NA    How long can you stand comfortably? <30 min, does minimal standing;    How long can you walk comfortably? depends on the day, fatigue affects his walking; He is walking every day, about 15-20 min;    Diagnostic tests None recent    Patient Stated Goals to walk better and be independent in bed mobility;    Currently in Pain? No/denies               Interventions: Therapeutic Activities: Upon arrival- Assist to restroom to perform toileting to change shorts and pull up: Patient was able to stand with supervision and required min assist to pull shorts/pull up up/down while he was holding onto grab bar. Patient was able to utilize one hand to assist to negotiating shorts and maintain standing without significant unsteadiness yet no loss of balance.  Therapeutic Exercises:  Static stand at support bar:  1 min with BUE support then able to let hands go and stand for another 3:15. (Total time standing = 4:15)   Patient was instructed in and performed:   Stand hip march (  min verbal and visual cues to hold x 2 sec and perform slowly)  Stand hip abd Stand hip ext Sit to stand (focusing on pushing up from wheelchair armrests vs. Reaching for support bar and pulling up)  Standing ham curl- Minimal AROM  either leg yet able to weight shift well and stand erect at bar.  Patient performed 10-12 reps- alternating LEs  Education provided throughout session via VC/TC and demonstration to facilitate movement at target joints and correct muscle activation for all testing and exercises performed.    Clinical Impression: Patient was able to stand well today with toileting and able to utilize 1 hand to assist with don/doff clothing. He was also able to demonstrate improving standing posture today without UE support and no  loss of balance. Patient was able to follow all VC, TC and visual demo to perform standing activities well- Limited by fatigue. Patient will benefit from skilled physical therapy to increase strength, mobility, and stability for decreased fall risk and improved ADL ability                     PT Education - 11/21/20 0825     Education Details Standing exercise technique and body mechanics    Person(s) Educated Patient    Methods Explanation;Demonstration;Tactile cues;Verbal cues    Comprehension Verbalized understanding;Returned demonstration;Verbal cues required;Tactile cues required;Need further instruction              PT Short Term Goals - 11/15/20 1310       PT SHORT TERM GOAL #1   Title Patient will be adherent to HEP at least 3x a week to improve functional strength and balance for better safety at home.    Baseline 11/15/2020- Patient reports performing seated LE hip march, knee ext, sit to stand and walking daily and states no questions. Advised patient to practice static standing using his walker or kitchen counter several times per day without UE support.    Time 4    Period Weeks    Status Achieved    Target Date 11/01/20      PT SHORT TERM GOAL #2   Title Patient will be independent in bed mobility including sit to supine utilizing bed rails as needed for increased mobility at home.    Baseline 6/29: requires min A    Time 4    Period Weeks    Status New    Target Date 11/01/20               PT Long Term Goals - 11/15/20 1324       PT LONG TERM GOAL #1   Title Patient will improve FOTO score to > 50% to indicate improve functional mobility at home and in community.    Baseline 6/29: 42%    Time 8    Period Weeks    Status On-going    Target Date 11/29/20      PT LONG TERM GOAL #2   Title Patient (< 22 years old) will complete five times sit to stand test in < 20 seconds indicating an increased LE strength and improved balance.     Baseline 6/29: 23.8 sec with 2HHA. 11/15/2020= 48 sec with BUE Support and CGA using gait belt.    Time 8    Period Weeks    Status On-going    Target Date 11/29/20      PT LONG TERM GOAL #3   Title Patient will increase 10 meter walk test  to >0.15 m/s (60 sec or less) as to improve gait speed for better home ambulation and to reduce fall risk.    Baseline 6/29: 0.08 m/s with RW. 11/15/2020= 0.093 m/s with RW    Time 8    Period Weeks    Status On-going    Target Date 11/29/20      PT LONG TERM GOAL #4   Title Patient will increase BLE gross strength to 4+/5 as to improve functional strength for independent gait, increased standing tolerance and increased ADL ability.    Baseline 6/29: see flowsheet. 11/15/2020    Time 8    Period Weeks    Status On-going    Target Date 11/29/20      PT LONG TERM GOAL #5   Title Patient will ambulate 250 ft with least assistive device and Supervision with no breaks to allow for increased mobility within home.    Baseline 11/15/2020-Ambulation distance = approx 110 feet using pediatric bariatric walker    Time 8    Period Weeks    Status On-going    Target Date 11/29/20                   Plan - 11/20/20 [id number]     Clinical Impression Statement Patient was able to stand well today with toileting and able to utilize 1 hand to assist with don/doff clothing. He was also able to demonstrate improving standing posture today without UE support and no loss of balance. Patient was able to follow all VC, TC and visual demo to perform standing activities well- Limited by fatigue. Patient will benefit from skilled physical therapy to increase strength, mobility, and stability for decreased fall risk and improved ADL ability    Personal Factors and Comorbidities Comorbidity 3+;Education;Past/Current Experience;Time since onset of injury/illness/exacerbation;Transportation    Comorbidities HTN, arthritis, past CVA, high fall risk, obesity     Examination-Activity Limitations Bed Mobility;Locomotion Level;Squat;Stairs;Stand;Toileting;Transfers    Examination-Participation Restrictions Community Activity;Driving;Cleaning;Meal Prep;Shop;Volunteer;Yard Work    Stability/Clinical Decision Making Stable/Uncomplicated    Rehab Potential Good    PT Frequency 2x / week    PT Duration 8 weeks    PT Treatment/Interventions ADLs/Self Care Home Management;Cryotherapy;Moist Heat;Gait training;Stair training;Functional mobility training;Therapeutic activities;Therapeutic exercise;Balance training;Neuromuscular re-education;Patient/family education;Orthotic Fit/Training;Passive range of motion;Energy conservation    PT Next Visit Plan Continue with progressive ambulation, transfer techniques, progressive LE strengthening    PT Home Exercise Plan no updates  this session; Reminder to patient to continue with daily walking    Consulted and Agree with Plan of Care Patient             Patient will benefit from skilled therapeutic intervention in order to improve the following deficits and impairments:  Abnormal gait, Decreased balance, Decreased endurance, Decreased mobility, Difficulty walking, Hypomobility, Obesity, Improper body mechanics, Decreased activity tolerance, Decreased strength, Postural dysfunction  Visit Diagnosis: Abnormality of gait and mobility  Difficulty in walking, not elsewhere classified  Muscle weakness (generalized)  Other abnormalities of gait and mobility  Unsteadiness on feet     Problem List Patient Active Problem List   Diagnosis Date Noted   Impaired glucose tolerance 03/23/2020   Learning disability 03/23/2020   Urinary and fecal incontinence 06/14/2019   Chronic pain syndrome 03/23/2019   Constipation 03/23/2019   Dysarthria 03/23/2019   Dysphagia 03/23/2019   Learning difficulty 03/23/2019   Morbid obesity ([place]) 03/23/2019   Muscle weakness 03/23/2019   Vitamin D deficiency 03/23/2019    Bilateral carotid artery  stenosis 12/15/2018   Moderate aortic valve stenosis 12/15/2018   BMI 45.0-49.9, adult ([place]) 11/10/2018   Cerebrovascular accident ([place]) 05/02/2016   Hemiparesis affecting right side as late effect of cerebrovascular accident ([place]) 05/01/2016   Brain tumor ([place]) 11/24/2014   Gastro-esophageal reflux disease without esophagitis 11/24/2014   Accident due to mechanical fall without injury 08/03/2014   Essential hypertension 06/13/2014   Mixed hyperlipidemia 06/13/2014   Unspecified sequelae of cerebral infarction 06/13/2014   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

## 2020-11-22 ENCOUNTER — Ambulatory Visit: Payer: Medicare Other

## 2020-11-22 ENCOUNTER — Other Ambulatory Visit: Payer: Self-pay

## 2020-11-22 DIAGNOSIS — R2689 Other abnormalities of gait and mobility: Secondary | ICD-10-CM

## 2020-11-22 DIAGNOSIS — M6281 Muscle weakness (generalized): Secondary | ICD-10-CM

## 2020-11-22 DIAGNOSIS — R269 Unspecified abnormalities of gait and mobility: Secondary | ICD-10-CM | POA: Diagnosis not present

## 2020-11-22 NOTE — Therapy (Signed)
[place] MAIN [facility] SERVICES [address] [place], [place], [address] Phone: [phone]   Fax:  [phone]  Physical Therapy Treatment  Patient Details  Name: Curtis Powell MRN: [id number] Date of Birth: 22-Jun-1965 Referring Provider (PT): Kym Groom MD   Encounter Date: 11/22/2020   PT End of Session - 11/22/20 1319     Visit Number 12    Number of Visits 17    Date for PT Re-Evaluation 11/29/20    Authorization Type start of care 10/04/20    Authorization Time Period PN on 11/15/2020    PT Start Time 1240    PT Stop Time 1335    PT Time Calculation (min) 55 min    Equipment Utilized During Treatment Gait belt    Activity Tolerance Patient tolerated treatment well    Behavior During Therapy WFL for tasks assessed/performed             Past Medical History:  Diagnosis Date   GERD (gastroesophageal reflux disease)    Hyperlipidemia    Hypertension    Obesity    Stroke [facility])     Past Surgical History:  Procedure Laterality Date   BRAIN SURGERY      There were no vitals filed for this visit.   Subjective Assessment - 11/22/20 1314     Subjective Patient presents to PT early having to go to bathroom and needing assistance.    Pertinent History Patient is a pleasant 55 year old male who presents to physical therapy for weakness and immobility secondary to CVA.  Had a stroke in Feb 2018. He was previously fully independent, but this stroke caused severe residual deficits, mainly on the right side. He reports difficulty walking and decreased mobility. His mom had to move in with him and now is his main caregiver.  Patient had outpatient PT from November 2018-March 2022. Patient reports since being discharged from outpatient therapy he has kept up with his HEP daily. He does report desire for walking better and for getting in and out of bed easier. He reports his mom is helping with some ADLs. He reports needing help with rolling in  bed. He reports his mom/aide assist with dressing. He has a home aide 3 days out of the week;    Limitations Standing;Walking    How long can you sit comfortably? NA    How long can you stand comfortably? <30 min, does minimal standing;    How long can you walk comfortably? depends on the day, fatigue affects his walking; He is walking every day, about 15-20 min;    Diagnostic tests None recent    Patient Stated Goals to walk better and be independent in bed mobility;    Currently in Pain? No/denies                    Interventions: Therapeutic Activities:  Assist to restroom to perform toileting: Patient required min assist to pull pants up/down while he was holding onto grab bar. Patient was able to utilize one hand to assist to negotiating pants and  stand no loss of balance.; x 2 attempts (first toileting at start of session, second toileting and end of session)        Therapeutic Exercises:  In // bars: -forwards/backwards walking 4x length of // bars; cues for hand placement and sequencing -lateral stepping 2x length of // bars ; patient reports as "hard" level -Standing  hip flexion into YTB across bars.  x 15 reps each LE (VC for increased height) -static stand 1 min 10 seconds without UE support   -4" step toe taps 10x each LE   Ambulation= 160 feet today with close CGA and one seated rest break. Patient utilized pediatric bariatric RW- Exhibiting short step to gait mostly with occasional ability to progress to short reciprocal steps. VC to maintain his posture and remain close to walker.  Increased external rotation of R foot as he fatigued.    Education provided throughout session via VC/TC and demonstration to facilitate movement at target joints and correct muscle activation for all testing and exercises performed.    Patient educated that he needs to discuss with physician about frequency of urination and accidents as sessions have been significantly limited by  need for toileting. If this frequency/interruption of therapy continues to persist patient will have to be discharged to home health as he is not able to receive optimal care in the outpatient setting at this current level of need.    Patient educated that he needs to discuss with physician about frequency of urination and accidents as sessions have been significantly limited by need for toileting. If this frequency/interruption of therapy continues to persist patient will have to be discharged to home health as he is not able to receive optimal care in the outpatient setting at this current level of need. Patient verbalized understanding and will call physician. He is able to tolerate progressive standing interventions this session with occasional seated rest breaks due to fatigue. Patient will benefit from skilled physical therapy to increase strength, mobility, and stability for decreased fall risk and improved ADL ability            PT Education - 11/22/20 1318     Education Details potential need for transition to home care due to continence issues    Person(s) Educated Patient    Methods Explanation;Demonstration;Tactile cues;Verbal cues    Comprehension Verbalized understanding;Returned demonstration;Verbal cues required;Tactile cues required              PT Short Term Goals - 11/15/20 1310       PT SHORT TERM GOAL #1   Title Patient will be adherent to HEP at least 3x a week to improve functional strength and balance for better safety at home.    Baseline 11/15/2020- Patient reports performing seated LE hip march, knee ext, sit to stand and walking daily and states no questions. Advised patient to practice static standing using his walker or kitchen counter several times per day without UE support.    Time 4    Period Weeks    Status Achieved    Target Date 11/01/20      PT SHORT TERM GOAL #2   Title Patient will be independent in bed mobility including sit to supine  utilizing bed rails as needed for increased mobility at home.    Baseline 6/29: requires min A    Time 4    Period Weeks    Status New    Target Date 11/01/20               PT Long Term Goals - 11/15/20 1324       PT LONG TERM GOAL #1   Title Patient will improve FOTO score to > 50% to indicate improve functional mobility at home and in community.    Baseline 6/29: 42%    Time 8    Period Weeks    Status On-going  Target Date 11/29/20      PT LONG TERM GOAL #2   Title Patient (< 53 years old) will complete five times sit to stand test in < 20 seconds indicating an increased LE strength and improved balance.    Baseline 6/29: 23.8 sec with 2HHA. 11/15/2020= 48 sec with BUE Support and CGA using gait belt.    Time 8    Period Weeks    Status On-going    Target Date 11/29/20      PT LONG TERM GOAL #3   Title Patient will increase 10 meter walk test to >0.15 m/s (60 sec or less) as to improve gait speed for better home ambulation and to reduce fall risk.    Baseline 6/29: 0.08 m/s with RW. 11/15/2020= 0.093 m/s with RW    Time 8    Period Weeks    Status On-going    Target Date 11/29/20      PT LONG TERM GOAL #4   Title Patient will increase BLE gross strength to 4+/5 as to improve functional strength for independent gait, increased standing tolerance and increased ADL ability.    Baseline 6/29: see flowsheet. 11/15/2020    Time 8    Period Weeks    Status On-going    Target Date 11/29/20      PT LONG TERM GOAL #5   Title Patient will ambulate 250 ft with least assistive device and Supervision with no breaks to allow for increased mobility within home.    Baseline 11/15/2020-Ambulation distance = approx 110 feet using pediatric bariatric walker    Time 8    Period Weeks    Status On-going    Target Date 11/29/20                   Plan - 11/22/20 1338     Clinical Impression Statement Patient educated that he needs to discuss with physician about  frequency of urination and accidents as sessions have been significantly limited by need for toileting. If this frequency/interruption of therapy continues to persist patient will have to be discharged to home health as he is not able to receive optimal care in the outpatient setting at this current level of need. Patient verbalized understanding and will call physician. He is able to tolerate progressive standing interventions this session with occasional seated rest breaks due to fatigue. Patient will benefit from skilled physical therapy to increase strength, mobility, and stability for decreased fall risk and improved ADL ability    Personal Factors and Comorbidities Comorbidity 3+;Education;Past/Current Experience;Time since onset of injury/illness/exacerbation;Transportation    Comorbidities HTN, arthritis, past CVA, high fall risk, obesity    Examination-Activity Limitations Bed Mobility;Locomotion Level;Squat;Stairs;Stand;Toileting;Transfers    Examination-Participation Restrictions Community Activity;Driving;Cleaning;Meal Prep;Shop;Volunteer;Yard Work    Stability/Clinical Decision Making Stable/Uncomplicated    Rehab Potential Good    PT Frequency 2x / week    PT Duration 8 weeks    PT Treatment/Interventions ADLs/Self Care Home Management;Cryotherapy;Moist Heat;Gait training;Stair training;Functional mobility training;Therapeutic activities;Therapeutic exercise;Balance training;Neuromuscular re-education;Patient/family education;Orthotic Fit/Training;Passive range of motion;Energy conservation    PT Next Visit Plan Continue with progressive ambulation, transfer techniques, progressive LE strengthening    PT Home Exercise Plan no updates  this session; Reminder to patient to continue with daily walking    Consulted and Agree with Plan of Care Patient             Patient will benefit from skilled therapeutic intervention in order to improve the following deficits and impairments:  Abnormal gait, Decreased balance, Decreased endurance, Decreased mobility, Difficulty walking, Hypomobility, Obesity, Improper body mechanics, Decreased activity tolerance, Decreased strength, Postural dysfunction  Visit Diagnosis: Abnormality of gait and mobility  Muscle weakness (generalized)  Other abnormalities of gait and mobility     Problem List Patient Active Problem List   Diagnosis Date Noted   Impaired glucose tolerance 03/23/2020   Learning disability 03/23/2020   Urinary and fecal incontinence 06/14/2019   Chronic pain syndrome 03/23/2019   Constipation 03/23/2019   Dysarthria 03/23/2019   Dysphagia 03/23/2019   Learning difficulty 03/23/2019   Morbid obesity ([place]) 03/23/2019   Muscle weakness 03/23/2019   Vitamin D deficiency 03/23/2019   Bilateral carotid artery stenosis 12/15/2018   Moderate aortic valve stenosis 12/15/2018   BMI 45.0-49.9, adult ([place]) 11/10/2018   Cerebrovascular accident ([place]) 05/02/2016   Hemiparesis affecting right side as late effect of cerebrovascular accident ([place]) 05/01/2016   Brain tumor ([place]) 11/24/2014   Gastro-esophageal reflux disease without esophagitis 11/24/2014   Accident due to mechanical fall without injury 08/03/2014   Essential hypertension 06/13/2014   Mixed hyperlipidemia 06/13/2014   Unspecified sequelae of cerebral infarction 06/13/2014   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 [place], [place], [address] Phone: [phone]   Fax:  [phone]  Name: Curtis Powell MRN: [id number] Date of Birth: March 05, 1966

## 2020-11-27 ENCOUNTER — Ambulatory Visit: Payer: Medicare Other

## 2020-11-27 ENCOUNTER — Other Ambulatory Visit: Payer: Self-pay

## 2020-11-27 DIAGNOSIS — R269 Unspecified abnormalities of gait and mobility: Secondary | ICD-10-CM | POA: Diagnosis not present

## 2020-11-27 DIAGNOSIS — R262 Difficulty in walking, not elsewhere classified: Secondary | ICD-10-CM

## 2020-11-27 DIAGNOSIS — R278 Other lack of coordination: Secondary | ICD-10-CM

## 2020-11-27 DIAGNOSIS — R2681 Unsteadiness on feet: Secondary | ICD-10-CM

## 2020-11-27 DIAGNOSIS — M6281 Muscle weakness (generalized): Secondary | ICD-10-CM

## 2020-11-27 NOTE — Therapy (Signed)
[place] MAIN [facility] SERVICES [address] [place], [place], [address] Phone: [phone]   Fax:  [phone]  Physical Therapy Treatment  Patient Details  Name: Curtis Powell MRN: [id number] Date of Birth: 02-Mar-1966 Referring Provider (PT): Kym Groom MD   Encounter Date: 11/27/2020   PT End of Session - 11/27/20 1355     Visit Number 13    Number of Visits 17    Date for PT Re-Evaluation 11/29/20    Authorization Type start of care 10/04/20    Authorization Time Period PN on 11/15/2020    PT Start Time 1345    PT Stop Time 1429    PT Time Calculation (min) 44 min    Equipment Utilized During Treatment Gait belt    Activity Tolerance Patient tolerated treatment well    Behavior During Therapy WFL for tasks assessed/performed             Past Medical History:  Diagnosis Date   GERD (gastroesophageal reflux disease)    Hyperlipidemia    Hypertension    Obesity    Stroke [facility])     Past Surgical History:  Procedure Laterality Date   BRAIN SURGERY      There were no vitals filed for this visit.   Subjective Assessment - 11/27/20 1353     Subjective Patient reports having some acute right lower leg pain (lateral) - attributing to right AFO brace. States pain intermittent up to 8/10    Pertinent History Patient is a pleasant 55 year old male who presents to physical therapy for weakness and immobility secondary to CVA.  Had a stroke in Feb 2018. He was previously fully independent, but this stroke caused severe residual deficits, mainly on the right side. He reports difficulty walking and decreased mobility. His mom had to move in with him and now is his main caregiver.  Patient had outpatient PT from November 2018-March 2022. Patient reports since being discharged from outpatient therapy he has kept up with his HEP daily. He does report desire for walking better and for getting in and out of bed easier. He reports his mom is helping  with some ADLs. He reports needing help with rolling in bed. He reports his mom/aide assist with dressing. He has a home aide 3 days out of the week;    Limitations Standing;Walking    How long can you sit comfortably? NA    How long can you stand comfortably? <30 min, does minimal standing;    How long can you walk comfortably? depends on the day, fatigue affects his walking; He is walking every day, about 15-20 min;    Diagnostic tests None recent    Patient Stated Goals to walk better and be independent in bed mobility;    Currently in Pain? Yes    Pain Score 8     Pain Location Calf    Pain Orientation Right;Lateral    Pain Descriptors / Indicators Aching    Pain Type Acute pain    Pain Onset In the past 7 days    Pain Frequency Intermittent    Aggravating Factors  None    Pain Relieving Factors rest    Effect of Pain on Daily Activities Difficulty with getting comfortable and moving/standing    Multiple Pain Sites No              Interventions:  Therapeutic Exercises:  Ambulation= 183 feet today with close CGA and one seated rest break.  Patient utilized pediatric bariatric RW- Exhibiting short step to gait mostly with occasional ability to progress to short reciprocal steps. VC to maintain his posture and remain close to walker.  Increased external rotation of R foot and decreased left LE step length as he fatigued. Patient reported very fatigued at end of walk.   Static standing in // bars: Patient performed static standing x 2 trials 1) 2 min 30 sec  2) 3 min 48 sec VC to lean slightly anteriorly as he initially presents with posterior lean. He has difficulty standing erect. No complaint of right LE Pain.   Therapeutic Activities:  Assist to restroom to perform toileting: Patient required min assist to pull pants up/down while he was holding onto grab bar. Patient was able to utilize one hand to assist to negotiating pants and  stand no loss of balance.   Clinical  Impression: Patient able to increase gait distance without significant difficulty other than fatigue. He experiences some increased gait abnormalities (R foot external Rotation/Decreased L LE Step length) placing him at increased risk of falling. He was able to negotiate pulling up his pull up and shorts with less overall assist and no LOB today. Patient will benefit from skilled physical therapy to increase strength, mobility, and stability for decreased fall risk and improved ADL ability                          PT Education - 11/27/20 2232     Education Details Gait cues/education in safe mobility    Person(s) Educated Patient    Methods Explanation;Demonstration;Tactile cues;Verbal cues    Comprehension Verbalized understanding;Returned demonstration;Verbal cues required;Tactile cues required;Need further instruction              PT Short Term Goals - 11/15/20 1310       PT SHORT TERM GOAL #1   Title Patient will be adherent to HEP at least 3x a week to improve functional strength and balance for better safety at home.    Baseline 11/15/2020- Patient reports performing seated LE hip march, knee ext, sit to stand and walking daily and states no questions. Advised patient to practice static standing using his walker or kitchen counter several times per day without UE support.    Time 4    Period Weeks    Status Achieved    Target Date 11/01/20      PT SHORT TERM GOAL #2   Title Patient will be independent in bed mobility including sit to supine utilizing bed rails as needed for increased mobility at home.    Baseline 6/29: requires min A    Time 4    Period Weeks    Status New    Target Date 11/01/20               PT Long Term Goals - 11/15/20 1324       PT LONG TERM GOAL #1   Title Patient will improve FOTO score to > 50% to indicate improve functional mobility at home and in community.    Baseline 6/29: 42%    Time 8    Period Weeks    Status  On-going    Target Date 11/29/20      PT LONG TERM GOAL #2   Title Patient (< 7 years old) will complete five times sit to stand test in < 20 seconds indicating an increased LE strength and improved balance.    Baseline 6/29:  23.8 sec with 2HHA. 11/15/2020= 48 sec with BUE Support and CGA using gait belt.    Time 8    Period Weeks    Status On-going    Target Date 11/29/20      PT LONG TERM GOAL #3   Title Patient will increase 10 meter walk test to >0.15 m/s (60 sec or less) as to improve gait speed for better home ambulation and to reduce fall risk.    Baseline 6/29: 0.08 m/s with RW. 11/15/2020= 0.093 m/s with RW    Time 8    Period Weeks    Status On-going    Target Date 11/29/20      PT LONG TERM GOAL #4   Title Patient will increase BLE gross strength to 4+/5 as to improve functional strength for independent gait, increased standing tolerance and increased ADL ability.    Baseline 6/29: see flowsheet. 11/15/2020    Time 8    Period Weeks    Status On-going    Target Date 11/29/20      PT LONG TERM GOAL #5   Title Patient will ambulate 250 ft with least assistive device and Supervision with no breaks to allow for increased mobility within home.    Baseline 11/15/2020-Ambulation distance = approx 110 feet using pediatric bariatric walker    Time 8    Period Weeks    Status On-going    Target Date 11/29/20                   Plan - 11/27/20 1355     Clinical Impression Statement Patient able to increase gait distance without significant difficulty other than fatigue. He experiences some increased gait abnormalities (R foot external Rotation/Decreased L LE Step length) placing him at increased risk of falling. He was able to negotiate pulling up his pull up and shorts with less overall assist and no LOB today. Patient will benefit from skilled physical therapy to increase strength, mobility, and stability for decreased fall risk and improved ADL ability    Personal  Factors and Comorbidities Comorbidity 3+;Education;Past/Current Experience;Time since onset of injury/illness/exacerbation;Transportation    Comorbidities HTN, arthritis, past CVA, high fall risk, obesity    Examination-Activity Limitations Bed Mobility;Locomotion Level;Squat;Stairs;Stand;Toileting;Transfers    Examination-Participation Restrictions Community Activity;Driving;Cleaning;Meal Prep;Shop;Volunteer;Yard Work    Stability/Clinical Decision Making Stable/Uncomplicated    Rehab Potential Good    PT Frequency 2x / week    PT Duration 8 weeks    PT Treatment/Interventions ADLs/Self Care Home Management;Cryotherapy;Moist Heat;Gait training;Stair training;Functional mobility training;Therapeutic activities;Therapeutic exercise;Balance training;Neuromuscular re-education;Patient/family education;Orthotic Fit/Training;Passive range of motion;Energy conservation    PT Next Visit Plan Continue with progressive ambulation, transfer techniques, progressive LE strengthening    PT Home Exercise Plan no updates  this session; Reminder to patient to continue with daily walking    Consulted and Agree with Plan of Care Patient             Patient will benefit from skilled therapeutic intervention in order to improve the following deficits and impairments:  Abnormal gait, Decreased balance, Decreased endurance, Decreased mobility, Difficulty walking, Hypomobility, Obesity, Improper body mechanics, Decreased activity tolerance, Decreased strength, Postural dysfunction  Visit Diagnosis: Abnormality of gait and mobility  Difficulty in walking, not elsewhere classified  Muscle weakness (generalized)  Other lack of coordination  Unsteadiness on feet     Problem List Patient Active Problem List   Diagnosis Date Noted   Impaired glucose tolerance 03/23/2020   Learning disability 03/23/2020   Urinary  and fecal incontinence 06/14/2019   Chronic pain syndrome 03/23/2019   Constipation  03/23/2019   Dysarthria 03/23/2019   Dysphagia 03/23/2019   Learning difficulty 03/23/2019   Morbid obesity ([place]) 03/23/2019   Muscle weakness 03/23/2019   Vitamin D deficiency 03/23/2019   Bilateral carotid artery stenosis 12/15/2018   Moderate aortic valve stenosis 12/15/2018   BMI 45.0-49.9, adult ([place]) 11/10/2018   Cerebrovascular accident ([place]) 05/02/2016   Hemiparesis affecting right side as late effect of cerebrovascular accident ([place]) 05/01/2016   Brain tumor ([place]) 11/24/2014   Gastro-esophageal reflux disease without esophagitis 11/24/2014   Accident due to mechanical fall without injury 08/03/2014   Essential hypertension 06/13/2014   Mixed hyperlipidemia 06/13/2014   Unspecified sequelae of cerebral infarction 06/13/2014   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

## 2020-11-29 ENCOUNTER — Ambulatory Visit: Payer: Medicare Other

## 2020-11-29 ENCOUNTER — Other Ambulatory Visit: Payer: Self-pay

## 2020-11-29 DIAGNOSIS — R269 Unspecified abnormalities of gait and mobility: Secondary | ICD-10-CM | POA: Diagnosis not present

## 2020-11-29 DIAGNOSIS — M6281 Muscle weakness (generalized): Secondary | ICD-10-CM

## 2020-11-29 DIAGNOSIS — R262 Difficulty in walking, not elsewhere classified: Secondary | ICD-10-CM

## 2020-11-29 NOTE — Therapy (Signed)
[place] MAIN [facility] SERVICES [address] [place], [place], [address] Phone: [phone]   Fax:  [phone]  Physical Therapy Treatment/RECERT  Patient Details  Name: Curtis Powell MRN: [id number] Date of Birth: [birth year] Referring Provider (PT): Kym Groom MD   Encounter Date: 11/29/2020   PT End of Session - 11/29/20 1253     Visit Number 14    Number of Visits 30    Date for PT Re-Evaluation 01/24/21    Authorization Type start of care 10/04/20    Authorization Time Period PN on 11/15/2020    PT Start Time 1259    PT Stop Time 1344    PT Time Calculation (min) 45 min    Equipment Utilized During Treatment Gait belt    Activity Tolerance Patient tolerated treatment well    Behavior During Therapy WFL for tasks assessed/performed             Past Medical History:  Diagnosis Date   GERD (gastroesophageal reflux disease)    Hyperlipidemia    Hypertension    Obesity    Stroke [facility])     Past Surgical History:  Procedure Laterality Date   BRAIN SURGERY      There were no vitals filed for this visit.   Subjective Assessment - 11/29/20 1355     Subjective Patient reports compliance with HEP reports no pain today.  Has tried to limit his water intake prior to PT session to reduce toileting.  Reports no falls or loss of balance since last session    Pertinent History Patient is a pleasant 55 year old male who presents to physical therapy for weakness and immobility secondary to CVA.  Had a stroke in Feb 2018. He was previously fully independent, but this stroke caused severe residual deficits, mainly on the right side. He reports difficulty walking and decreased mobility. His mom had to move in with him and now is his main caregiver.  Patient had outpatient PT from November 2018-March 2022. Patient reports since being discharged from outpatient therapy he has kept up with his HEP daily. He does report desire for walking better and for  getting in and out of bed easier. He reports his mom is helping with some ADLs. He reports needing help with rolling in bed. He reports his mom/aide assist with dressing. He has a home aide 3 days out of the week;    Limitations Standing;Walking    How long can you sit comfortably? NA    How long can you stand comfortably? <30 min, does minimal standing;    How long can you walk comfortably? depends on the day, fatigue affects his walking; He is walking every day, about 15-20 min;    Diagnostic tests None recent    Patient Stated Goals to walk better and be independent in bed mobility;    Currently in Pain? No/denies                  Goals; Bed mobility: sit to supine : moderate help required FOTO: 40%  5x STS: 23 seconds with 2 HHA  10 MWT 1 min 1 second= 0.15 m/s   BLE strength : hips grossly 4-/5 with hip extension 2+/5; L knee flexion 2+/5 knee extension 4/5; R knee 3+/5 Ambulate 250 ft : 155 ft with bariatric RW    Treatment:   Seated with # 4lb ankle weights  -Seated marches with upright posture, back away from back of chair for abdominal/trunk  activation/stabilization, 15x each LE -Seated LAQ with 3 second holds, 15x each LE, cueing for muscle activation and sequencing for neutral alignment -Seated IR/ER with cueing for stabilizing knee placement with lateral foot movement for optimal muscle recruitment, 15x each LE - heel raise 15x   RTB hamstring curl 15x each LE: very challenging RLE  RTB adduction 15x each LE RTB abduction 15x each LE  Toileting performed during session with patient requiring assistance with donning doffing pants, stabilizing self, and washing of hands.  Patient unable to void however did attempt.  Pt educated throughout session about proper posture and technique with exercises. Improved exercise technique, movement at target joints, use of target muscles after min to mod verbal, visual, tactile cues.  Therapist conversed with patient's mother  about issue of toileting during session.  Some sessions required almost half of session to be in restroom.  [name] verbalized understanding that patient may need to see physician.  And if this continues that the outpatient setting may not be appropriate for the patient.  Patient has met his 10 MWT goal and made progress on the rest of his goals.  10 m walk test was progressed to quicker velocity goal.  His sit to stands remain challenged by limited hip extension.  Overall lower extremity strength is improving however right lower extremity continues to have episodes of hyperextension with prolonged ambulation.  Continuation of conversation about need for toileting correction due to limited time during sessions. Patient will benefit from skilled physical therapy to increase strength, mobility, and stability for decreased fall risk and improved ADL ability      Note: Portions of this document were prepared using Dragon voice recognition software and although reviewed may contain unintentional dictation errors in syntax, grammar, or spelling         PT Education - 11/29/20 1253     Education Details exercise technique, body mechanics    Person(s) Educated Patient    Methods Explanation;Demonstration;Tactile cues;Verbal cues    Comprehension Verbalized understanding;Returned demonstration;Verbal cues required;Tactile cues required              PT Short Term Goals - 11/29/20 1334       PT SHORT TERM GOAL #1   Title Patient will be adherent to HEP at least 3x a week to improve functional strength and balance for better safety at home.    Baseline 11/15/2020- Patient reports performing seated LE hip march, knee ext, sit to stand and walking daily and states no questions. Advised patient to practice static standing using his walker or kitchen counter several times per day without UE support.    Time 4    Period Weeks    Status Achieved    Target Date 11/01/20      PT SHORT  TERM GOAL #2   Title Patient will be independent in bed mobility including sit to supine utilizing bed rails as needed for increased mobility at home.    Baseline 6/29: requires min A 8/24: patient reports needing mod A    Time 4    Period Weeks    Status On-going    Target Date 12/27/20               PT Long Term Goals - 11/29/20 1328       PT LONG TERM GOAL #1   Title Patient will improve FOTO score to > 50% to indicate improve functional mobility at home and in community.    Baseline 6/29: 42% 8/24:  40%    Time 8    Period Weeks    Status On-going    Target Date 01/24/21      PT LONG TERM GOAL #2   Title Patient (< 31 years old) will complete five times sit to stand test in < 20 seconds indicating an increased LE strength and improved balance.    Baseline 6/29: 23.8 sec with 2HHA. 11/15/2020= 48 sec with BUE Support and CGA using gait belt. 8/24: 23 seconds with 2 HHA    Time 8    Period Weeks    Status Partially Met    Target Date 01/24/21      PT LONG TERM GOAL #3   Title Patient will increase 10 meter walk test to >0.15 m/s (60 sec or less) as to improve gait speed for better home ambulation and to reduce fall risk.    Baseline 6/29: 0.08 m/s with RW. 11/15/2020= 0.093 m/s with RW 8/24: 0.15 m/s    Time 8    Period Weeks    Status Achieved      PT LONG TERM GOAL #4   Title Patient will increase BLE gross strength to 4+/5 as to improve functional strength for independent gait, increased standing tolerance and increased ADL ability.    Baseline 6/29: see flowsheet. 8/24: hips grossly 4-/5 with hip extension 2+/5; L knee flexion 2+/5 knee extension 4/5; R knee 3+/5    Time 8    Period Weeks    Status Partially Met    Target Date 01/24/21      PT LONG TERM GOAL #5   Title Patient will ambulate 250 ft with least assistive device and Supervision with no breaks to allow for increased mobility within home.    Baseline 11/15/2020-Ambulation distance = approx 110 feet  using pediatric bariatric walker 8/24: 155 ft with bariatric RW    Time 8    Period Weeks    Status Partially Met    Target Date 01/24/21      Additional Long Term Goals   Additional Long Term Goals Yes      PT LONG TERM GOAL #6   Title Patient will increase 10 meter walk test to >0.5 m/s ( 30 seconds) as to improve gait speed for better home ambulation and to reduce fall risk.    Baseline 8/24: 61 seconds: 0.15 m/s    Time 8    Period Weeks    Status New    Target Date 01/24/21                   Plan - 11/29/20 1356     Clinical Impression Statement Patient has met his 10 MWT goal and made progress on the rest of his goals.  10 m walk test was progressed to quicker velocity goal.  His sit to stands remain challenged by limited hip extension.  Overall lower extremity strength is improving however right lower extremity continues to have episodes of hyperextension with prolonged ambulation.  Continuation of conversation about need for toileting correction due to limited time during sessions. Patient will benefit from skilled physical therapy to increase strength, mobility, and stability for decreased fall risk and improved ADL ability    Personal Factors and Comorbidities Comorbidity 3+;Education;Past/Current Experience;Time since onset of injury/illness/exacerbation;Transportation    Comorbidities HTN, arthritis, past CVA, high fall risk, obesity    Examination-Activity Limitations Bed Mobility;Locomotion Level;Squat;Stairs;Stand;Toileting;Transfers    Examination-Participation Restrictions Community Activity;Driving;Cleaning;Meal Prep;Shop;Volunteer;Yard Work    Stability/Clinical Decision Making Stable/Uncomplicated  Rehab Potential Good    PT Frequency 2x / week    PT Duration 8 weeks    PT Treatment/Interventions ADLs/Self Care Home Management;Cryotherapy;Moist Heat;Gait training;Stair training;Functional mobility training;Therapeutic activities;Therapeutic  exercise;Balance training;Neuromuscular re-education;Patient/family education;Orthotic Fit/Training;Passive range of motion;Energy conservation    PT Next Visit Plan Continue with progressive ambulation, transfer techniques, progressive LE strengthening    PT Home Exercise Plan no updates  this session; Reminder to patient to continue with daily walking    Consulted and Agree with Plan of Care Patient             Patient will benefit from skilled therapeutic intervention in order to improve the following deficits and impairments:  Abnormal gait, Decreased balance, Decreased endurance, Decreased mobility, Difficulty walking, Hypomobility, Obesity, Improper body mechanics, Decreased activity tolerance, Decreased strength, Postural dysfunction  Visit Diagnosis: Abnormality of gait and mobility  Difficulty in walking, not elsewhere classified  Muscle weakness (generalized)     Problem List Patient Active Problem List   Diagnosis Date Noted   Impaired glucose tolerance 03/23/2020   Learning disability 03/23/2020   Urinary and fecal incontinence 06/14/2019   Chronic pain syndrome 03/23/2019   Constipation 03/23/2019   Dysarthria 03/23/2019   Dysphagia 03/23/2019   Learning difficulty 03/23/2019   Morbid obesity ([place]) 03/23/2019   Muscle weakness 03/23/2019   Vitamin D deficiency 03/23/2019   Bilateral carotid artery stenosis 12/15/2018   Moderate aortic valve stenosis 12/15/2018   BMI 45.0-49.9, adult ([place]) 11/10/2018   Cerebrovascular accident ([place]) 05/02/2016   Hemiparesis affecting right side as late effect of cerebrovascular accident ([place]) 05/01/2016   Brain tumor ([place]) 11/24/2014   Gastro-esophageal reflux disease without esophagitis 11/24/2014   Accident due to mechanical fall without injury 08/03/2014   Essential hypertension 06/13/2014   Mixed hyperlipidemia 06/13/2014   Unspecified sequelae of cerebral infarction 06/13/2014   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/2014   Type 2  diabetes mellitus without complication ([place]) 32/20/2542   Neck mass 09/08/2013   Swimmer's ear 09/08/2013   Erectile dysfunction 01/03/2013   Prediabetes 01/03/2013   Janna Arch, PT, DPT  11/29/2020, 1:59 PM  [place] [facility] MAIN [facility] SERVICES [address] [place], [place], [address] Phone: [phone]   Fax:  [phone]  Name: Curtis Powell MRN: [id number] Date of Birth: [birth year]

## 2020-12-04 ENCOUNTER — Ambulatory Visit: Payer: Medicare Other | Admitting: Physical Therapy

## 2020-12-04 ENCOUNTER — Other Ambulatory Visit: Payer: Self-pay

## 2020-12-04 DIAGNOSIS — I69351 Hemiplegia and hemiparesis following cerebral infarction affecting right dominant side: Secondary | ICD-10-CM

## 2020-12-04 DIAGNOSIS — R2689 Other abnormalities of gait and mobility: Secondary | ICD-10-CM

## 2020-12-04 DIAGNOSIS — I639 Cerebral infarction, unspecified: Secondary | ICD-10-CM

## 2020-12-04 DIAGNOSIS — M6281 Muscle weakness (generalized): Secondary | ICD-10-CM

## 2020-12-04 DIAGNOSIS — R2681 Unsteadiness on feet: Secondary | ICD-10-CM

## 2020-12-04 DIAGNOSIS — R531 Weakness: Secondary | ICD-10-CM

## 2020-12-04 DIAGNOSIS — R269 Unspecified abnormalities of gait and mobility: Secondary | ICD-10-CM | POA: Diagnosis not present

## 2020-12-04 DIAGNOSIS — R262 Difficulty in walking, not elsewhere classified: Secondary | ICD-10-CM

## 2020-12-04 DIAGNOSIS — R278 Other lack of coordination: Secondary | ICD-10-CM

## 2020-12-04 NOTE — Therapy (Signed)
[place] MAIN [facility] SERVICES [address] [place], [place], [address] Phone: [phone]   Fax:  [phone]  Physical Therapy Treatment  Patient Details  Name: Curtis Powell MRN: [id number] Date of Birth: Jun 07, 1965 Referring Provider (PT): Kym Groom MD   Encounter Date: 12/04/2020   PT End of Session - 12/04/20 1515     Visit Number 15    Number of Visits 30    Date for PT Re-Evaluation 01/24/21    Authorization Type start of care 10/04/20    Authorization Time Period PN on 11/15/2020    PT Start Time 1344    PT Stop Time 1438    PT Time Calculation (min) 54 min    Equipment Utilized During Treatment Gait belt    Activity Tolerance Patient tolerated treatment well;Patient limited by fatigue    Behavior During Therapy WFL for tasks assessed/performed             Past Medical History:  Diagnosis Date   GERD (gastroesophageal reflux disease)    Hyperlipidemia    Hypertension    Obesity    Stroke [facility])     Past Surgical History:  Procedure Laterality Date   BRAIN SURGERY      There were no vitals filed for this visit.   Subjective Assessment - 12/04/20 1513     Subjective Patient states he is good today. Reports no falls or loss of balance since last session. States his step-daughter is getting married in [place] in 2 weeks - unsure if he will attend.    Pertinent History Patient is a pleasant 55 year old male who presents to physical therapy for weakness and immobility secondary to CVA.  Had a stroke in Feb 2018. He was previously fully independent, but this stroke caused severe residual deficits, mainly on the right side. He reports difficulty walking and decreased mobility. His mom had to move in with him and now is his main caregiver.  Patient had outpatient PT from November 2018-March 2022. Patient reports since being discharged from outpatient therapy he has kept up with his HEP daily. He does report desire for walking better and  for getting in and out of bed easier. He reports his mom is helping with some ADLs. He reports needing help with rolling in bed. He reports his mom/aide assist with dressing. He has a home aide 3 days out of the week;    Limitations Standing;Walking    How long can you sit comfortably? NA    How long can you stand comfortably? <30 min, does minimal standing;    How long can you walk comfortably? depends on the day, fatigue affects his walking; He is walking every day, about 15-20 min;    Diagnostic tests None recent    Patient Stated Goals to walk better and be independent in bed mobility;    Currently in Pain? No/denies             Treatment:    Seated with # 4lb ankle weights  -Seated marches with upright posture, back away from back of chair for abdominal/trunk activation/stabilization, 15x each LE; -Seated LAQ with 3 second holds, 15x each LE, cueing for muscle activation and sequencing for neutral alignment; - heel raise with 3 second hold, 15x;   RTB hamstring curl 15x each LE: very challenging RLE; RTB adduction, 3 second hold, 15x each LE; RTB abduction, 15x each LE; 2nd set of 15 performed with focus on RLE abductor engagement;  Ambulation= 65 feet today with close CGA. Patient utilized pediatric bariatric RW- Exhibiting short step-through gait pattern with reciprocal steps. VC to maintain his posture and remain close to walker.  Increased external rotation of R foot Decreased LLE step length as he fatigued. Patient reported moderate fatigue at end of walk.    STS from chair with airex pad to elevate surface, x10 reps with seated rest break after 5. MOD A to lift and steady due to posterior lean. PT steadied RW and chair. 2 additional reps during functional movement.   Therapeutic Activities:   Toileting (void in standing) performed 1x at end of session with patient requiring assistance with donning doffing pants, stabilizing self, and washing of hands. Pt held onto grab  bars for safety and stabilization.   Stand pivot transfer w/c<>chair. Difficulty with pivot steps, maneuvering RW (PT assisted) and foot clearance. PT directed for safe alignment to sitting surface.   Clinical Impression: Pt arrived with good motivation to today's session. Ambulation distance decreased however he practiced improved gait mechanics as he maintained step-through gait pattern for majority of walk. He continues to demo decreased foot clearance bilaterally with heavy use of BUE on RW. VC provided throughout seated therex for increased RLE muscle activation and participation. He continued to require MOD A to stand from an elevated surface. VC on sequencing and hand placement for safety. Pt reported need to void at end of session. Due to cancellation in schedule, PT was able to assist pt to restroom. Patient will benefit from skilled physical therapy to increase strength, mobility, and stability for decreased fall risk and improved ADL ability.  Pt educated throughout session about proper posture and technique with exercises. Improved exercise technique, movement at target joints, use of target muscles after min to mod verbal, visual, tactile cues.        PT Short Term Goals - 11/29/20 1334       PT SHORT TERM GOAL #1   Title Patient will be adherent to HEP at least 3x a week to improve functional strength and balance for better safety at home.    Baseline 11/15/2020- Patient reports performing seated LE hip march, knee ext, sit to stand and walking daily and states no questions. Advised patient to practice static standing using his walker or kitchen counter several times per day without UE support.    Time 4    Period Weeks    Status Achieved    Target Date 11/01/20      PT SHORT TERM GOAL #2   Title Patient will be independent in bed mobility including sit to supine utilizing bed rails as needed for increased mobility at home.    Baseline 6/29: requires min A 8/24: patient  reports needing mod A    Time 4    Period Weeks    Status On-going    Target Date 12/27/20               PT Long Term Goals - 11/29/20 1328       PT LONG TERM GOAL #1   Title Patient will improve FOTO score to > 50% to indicate improve functional mobility at home and in community.    Baseline 6/29: 42% 8/24: 40%    Time 8    Period Weeks    Status On-going    Target Date 01/24/21      PT LONG TERM GOAL #2   Title Patient (< 32 years old) will complete five times sit to  stand test in < 20 seconds indicating an increased LE strength and improved balance.    Baseline 6/29: 23.8 sec with 2HHA. 11/15/2020= 48 sec with BUE Support and CGA using gait belt. 8/24: 23 seconds with 2 HHA    Time 8    Period Weeks    Status Partially Met    Target Date 01/24/21      PT LONG TERM GOAL #3   Title Patient will increase 10 meter walk test to >0.15 m/s (60 sec or less) as to improve gait speed for better home ambulation and to reduce fall risk.    Baseline 6/29: 0.08 m/s with RW. 11/15/2020= 0.093 m/s with RW 8/24: 0.15 m/s    Time 8    Period Weeks    Status Achieved      PT LONG TERM GOAL #4   Title Patient will increase BLE gross strength to 4+/5 as to improve functional strength for independent gait, increased standing tolerance and increased ADL ability.    Baseline 6/29: see flowsheet. 8/24: hips grossly 4-/5 with hip extension 2+/5; L knee flexion 2+/5 knee extension 4/5; R knee 3+/5    Time 8    Period Weeks    Status Partially Met    Target Date 01/24/21      PT LONG TERM GOAL #5   Title Patient will ambulate 250 ft with least assistive device and Supervision with no breaks to allow for increased mobility within home.    Baseline 11/15/2020-Ambulation distance = approx 110 feet using pediatric bariatric walker 8/24: 155 ft with bariatric RW    Time 8    Period Weeks    Status Partially Met    Target Date 01/24/21      Additional Long Term Goals   Additional Long Term  Goals Yes      PT LONG TERM GOAL #6   Title Patient will increase 10 meter walk test to >0.5 m/s ( 30 seconds) as to improve gait speed for better home ambulation and to reduce fall risk.    Baseline 8/24: 61 seconds: 0.15 m/s    Time 8    Period Weeks    Status New    Target Date 01/24/21             Plan - 12/04/20 1516     Clinical Impression Statement Pt arrived with good motivation to today's session. Ambulation distance decreased however he practiced improved gait mechanics as he maintained step-through gait pattern for majority of walk. He continues to demo decreased foot clearance bilaterally with heavy use of BUE on RW. VC provided throughout seated therex for increased RLE muscle activation and participation. He continued to require MOD A to stand from an elevated surface. VC on sequencing and hand placement for safety. Pt reported need to void at end of session. Due to cancellation in schedule, PT was able to assist pt to restroom. Patient will benefit from skilled physical therapy to increase strength, mobility, and stability for decreased fall risk and improved ADL ability.    Personal Factors and Comorbidities Comorbidity 3+;Education;Past/Current Experience;Time since onset of injury/illness/exacerbation;Transportation    Comorbidities HTN, arthritis, past CVA, high fall risk, obesity    Examination-Activity Limitations Bed Mobility;Locomotion Level;Squat;Stairs;Stand;Toileting;Transfers    Examination-Participation Restrictions Community Activity;Driving;Cleaning;Meal Prep;Shop;Volunteer;Yard Work    Stability/Clinical Decision Making Stable/Uncomplicated    Rehab Potential Good    PT Frequency 2x / week    PT Duration 8 weeks    PT Treatment/Interventions ADLs/Self Care Home  Management;Cryotherapy;Moist Heat;Gait training;Stair training;Functional mobility training;Therapeutic activities;Therapeutic exercise;Balance training;Neuromuscular re-education;Patient/family  education;Orthotic Fit/Training;Passive range of motion;Energy conservation    PT Next Visit Plan Continue with progressive ambulation, transfer techniques, progressive LE strengthening    PT Home Exercise Plan no updates  this session; Reminder to patient to continue with daily walking    Consulted and Agree with Plan of Care Patient             Patient will benefit from skilled therapeutic intervention in order to improve the following deficits and impairments:  Abnormal gait, Decreased balance, Decreased endurance, Decreased mobility, Difficulty walking, Hypomobility, Obesity, Improper body mechanics, Decreased activity tolerance, Decreased strength, Postural dysfunction  Visit Diagnosis: Abnormality of gait and mobility  Difficulty in walking, not elsewhere classified  Muscle weakness (generalized)  Other lack of coordination  Unsteadiness on feet  Other abnormalities of gait and mobility  Left-sided weakness  Hemiplegia and hemiparesis following cerebral infarction affecting right dominant side (HCC)  Acute CVA (cerebrovascular accident) [facility])     Problem List Patient Active Problem List   Diagnosis Date Noted   Impaired glucose tolerance 03/23/2020   Learning disability 03/23/2020   Urinary and fecal incontinence 06/14/2019   Chronic pain syndrome 03/23/2019   Constipation 03/23/2019   Dysarthria 03/23/2019   Dysphagia 03/23/2019   Learning difficulty 03/23/2019   Morbid obesity ([place]) 03/23/2019   Muscle weakness 03/23/2019   Vitamin D deficiency 03/23/2019   Bilateral carotid artery stenosis 12/15/2018   Moderate aortic valve stenosis 12/15/2018   BMI 45.0-49.9, adult ([place]) 11/10/2018   Cerebrovascular accident ([place]) 05/02/2016   Hemiparesis affecting right side as late effect of cerebrovascular accident ([place]) 05/01/2016   Brain tumor ([place]) 11/24/2014   Gastro-esophageal reflux disease without esophagitis 11/24/2014   Accident due to mechanical fall  without injury 08/03/2014   Essential hypertension 06/13/2014   Mixed hyperlipidemia 06/13/2014   Unspecified sequelae of cerebral infarction 06/13/2014   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/2014   Type 2 diabetes mellitus without complication ([place]) 66/59/9357   Neck mass 09/08/2013   Swimmer's ear 09/08/2013   Erectile dysfunction 01/03/2013   Prediabetes 01/03/2013     Patrina Levering PT, DPT  12/04/2020, 3:22 PM  [facility] MAIN [facility] SERVICES [address] [place], [place], [address] Phone: [phone]   Fax:  [phone]  Name: Curtis Powell MRN: [id number] Date of Birth: [birth year]

## 2020-12-06 ENCOUNTER — Other Ambulatory Visit: Payer: Self-pay

## 2020-12-06 ENCOUNTER — Ambulatory Visit: Payer: Medicare Other

## 2020-12-06 DIAGNOSIS — M6281 Muscle weakness (generalized): Secondary | ICD-10-CM

## 2020-12-06 DIAGNOSIS — R269 Unspecified abnormalities of gait and mobility: Secondary | ICD-10-CM | POA: Diagnosis not present

## 2020-12-06 DIAGNOSIS — R262 Difficulty in walking, not elsewhere classified: Secondary | ICD-10-CM

## 2020-12-06 NOTE — Therapy (Signed)
[place] MAIN [facility] SERVICES [address] [place], [place], [address] Phone: [phone]   Fax:  [phone]  Physical Therapy Treatment  Patient Details  Name: Curtis Powell MRN: [id number] Date of Birth: 1965/04/29 Referring Provider (PT): Kym Groom MD   Encounter Date: 12/06/2020   PT End of Session - 12/06/20 1252     Visit Number 16    Number of Visits 30    Date for PT Re-Evaluation 01/24/21    Authorization Type start of care 10/04/20    Authorization Time Period PN on 11/15/2020    PT Start Time 1259    PT Stop Time 1344    PT Time Calculation (min) 45 min    Equipment Utilized During Treatment Gait belt    Activity Tolerance Patient tolerated treatment well;Patient limited by fatigue    Behavior During Therapy WFL for tasks assessed/performed             Past Medical History:  Diagnosis Date   GERD (gastroesophageal reflux disease)    Hyperlipidemia    Hypertension    Obesity    Stroke [facility])     Past Surgical History:  Procedure Laterality Date   BRAIN SURGERY      There were no vitals filed for this visit.   Subjective Assessment - 12/06/20 1332     Subjective Patient reports no falls or LOB since last session. Is going to get his bathroom remodeled hopefully.    Pertinent History Patient is a pleasant 55 year old male who presents to physical therapy for weakness and immobility secondary to CVA.  Had a stroke in Feb 2018. He was previously fully independent, but this stroke caused severe residual deficits, mainly on the right side. He reports difficulty walking and decreased mobility. His mom had to move in with him and now is his main caregiver.  Patient had outpatient PT from November 2018-March 2022. Patient reports since being discharged from outpatient therapy he has kept up with his HEP daily. He does report desire for walking better and for getting in and out of bed easier. He reports his mom is helping with  some ADLs. He reports needing help with rolling in bed. He reports his mom/aide assist with dressing. He has a home aide 3 days out of the week;    Limitations Standing;Walking    How long can you sit comfortably? NA    How long can you stand comfortably? <30 min, does minimal standing;    How long can you walk comfortably? depends on the day, fatigue affects his walking; He is walking every day, about 15-20 min;    Diagnostic tests None recent    Patient Stated Goals to walk better and be independent in bed mobility;    Currently in Pain? No/denies                   Treatment:    Seated with # 4lb ankle weights  -Seated marches with upright posture, back away from back of chair for abdominal/trunk activation/stabilization, 15x each LE; -Seated LAQ with 3 second holds, 15x each LE, cueing for muscle activation and sequencing for neutral alignment; - heel raise with 3 second hold, 15x; -seated IR/ER 15x each LE;        Ambulation= 150 feet today with close CGA. Patient utilized pediatric bariatric RW- Exhibiting short step-through gait pattern with reciprocal steps. VC to maintain his posture and remain close to walker.  Increased external rotation  of R foot Decreased LLE step length as he fatigued. Patient reported moderate fatigue at end of walk. One seated rest break required.    In // bars Forwards/backwards ambulation 4x length of // bars Lateral stepping 4x length of // bars.  STS 10x from w/c with RW; increasing support required with repetition   Therapeutic Activities:    Toileting (void in standing) performed 1x at end of session with patient requiring assistance with donning doffing pants, stabilizing self, and washing of hands. Pt held onto grab bars for safety and stabilization.   Stand pivot transfer w/c<>chair. Difficulty with pivot steps, maneuvering RW (PT assisted) and foot clearance. PT directed for safe alignment to sitting surface.   Wheeling into/out  of bathroom with focus on lip, requires assistance for entering bathroom, supervision only for exiting.   Pt educated throughout session about proper posture and technique with exercises. Improved exercise technique, movement at target joints, use of target muscles after min to mod verbal, visual, tactile cues.  Patient is able to ambulate 150 ft with one seated rest break this session. Gait mechanics deteriorate as he fatigues with increased reliance upon Ue's. Education and performance of self propelling into/out of restroom is challenging with lip of bathroom. Toileting continues to take portion of session up despite education to patient and his mother on need for reduction of frequency for outpatient session. Patient will benefit from skilled physical therapy to increase strength, mobility, and stability for decreased fall risk and improved ADL ability.                  PT Education - 12/06/20 1252     Education Details exercise technique body mechanics    Person(s) Educated Patient    Methods Explanation;Demonstration;Tactile cues;Verbal cues    Comprehension Verbalized understanding;Returned demonstration;Verbal cues required;Tactile cues required              PT Short Term Goals - 11/29/20 1334       PT SHORT TERM GOAL #1   Title Patient will be adherent to HEP at least 3x a week to improve functional strength and balance for better safety at home.    Baseline 11/15/2020- Patient reports performing seated LE hip march, knee ext, sit to stand and walking daily and states no questions. Advised patient to practice static standing using his walker or kitchen counter several times per day without UE support.    Time 4    Period Weeks    Status Achieved    Target Date 11/01/20      PT SHORT TERM GOAL #2   Title Patient will be independent in bed mobility including sit to supine utilizing bed rails as needed for increased mobility at home.    Baseline 6/29: requires min  A 8/24: patient reports needing mod A    Time 4    Period Weeks    Status On-going    Target Date 12/27/20               PT Long Term Goals - 11/29/20 1328       PT LONG TERM GOAL #1   Title Patient will improve FOTO score to > 50% to indicate improve functional mobility at home and in community.    Baseline 6/29: 42% 8/24: 40%    Time 8    Period Weeks    Status On-going    Target Date 01/24/21      PT LONG TERM GOAL #2   Title Patient (<  60 years old) will complete five times sit to stand test in < 20 seconds indicating an increased LE strength and improved balance.    Baseline 6/29: 23.8 sec with 2HHA. 11/15/2020= 48 sec with BUE Support and CGA using gait belt. 8/24: 23 seconds with 2 HHA    Time 8    Period Weeks    Status Partially Met    Target Date 01/24/21      PT LONG TERM GOAL #3   Title Patient will increase 10 meter walk test to >0.15 m/s (60 sec or less) as to improve gait speed for better home ambulation and to reduce fall risk.    Baseline 6/29: 0.08 m/s with RW. 11/15/2020= 0.093 m/s with RW 8/24: 0.15 m/s    Time 8    Period Weeks    Status Achieved      PT LONG TERM GOAL #4   Title Patient will increase BLE gross strength to 4+/5 as to improve functional strength for independent gait, increased standing tolerance and increased ADL ability.    Baseline 6/29: see flowsheet. 8/24: hips grossly 4-/5 with hip extension 2+/5; L knee flexion 2+/5 knee extension 4/5; R knee 3+/5    Time 8    Period Weeks    Status Partially Met    Target Date 01/24/21      PT LONG TERM GOAL #5   Title Patient will ambulate 250 ft with least assistive device and Supervision with no breaks to allow for increased mobility within home.    Baseline 11/15/2020-Ambulation distance = approx 110 feet using pediatric bariatric walker 8/24: 155 ft with bariatric RW    Time 8    Period Weeks    Status Partially Met    Target Date 01/24/21      Additional Long Term Goals    Additional Long Term Goals Yes      PT LONG TERM GOAL #6   Title Patient will increase 10 meter walk test to >0.5 m/s ( 30 seconds) as to improve gait speed for better home ambulation and to reduce fall risk.    Baseline 8/24: 61 seconds: 0.15 m/s    Time 8    Period Weeks    Status New    Target Date 01/24/21                   Plan - 12/06/20 1335     Clinical Impression Statement Patient is able to ambulate 150 ft with one seated rest break this session. Gait mechanics deteriorate as he fatigues with increased reliance upon Ue's. Education and performance of self propelling into/out of restroom is challenging with lip of bathroom. Toileting continues to take portion of session up despite education to patient and his mother on need for reduction of frequency for outpatient session. Patient will benefit from skilled physical therapy to increase strength, mobility, and stability for decreased fall risk and improved ADL ability.    Personal Factors and Comorbidities Comorbidity 3+;Education;Past/Current Experience;Time since onset of injury/illness/exacerbation;Transportation    Comorbidities HTN, arthritis, past CVA, high fall risk, obesity    Examination-Activity Limitations Bed Mobility;Locomotion Level;Squat;Stairs;Stand;Toileting;Transfers    Examination-Participation Restrictions Community Activity;Driving;Cleaning;Meal Prep;Shop;Volunteer;Yard Work    Stability/Clinical Decision Making Stable/Uncomplicated    Rehab Potential Good    PT Frequency 2x / week    PT Duration 8 weeks    PT Treatment/Interventions ADLs/Self Care Home Management;Cryotherapy;Moist Heat;Gait training;Stair training;Functional mobility training;Therapeutic activities;Therapeutic exercise;Balance training;Neuromuscular re-education;Patient/family education;Orthotic Fit/Training;Passive range of motion;Energy conservation  PT Next Visit Plan Continue with progressive ambulation, transfer techniques,  progressive LE strengthening    PT Home Exercise Plan no updates  this session; Reminder to patient to continue with daily walking    Consulted and Agree with Plan of Care Patient             Patient will benefit from skilled therapeutic intervention in order to improve the following deficits and impairments:  Abnormal gait, Decreased balance, Decreased endurance, Decreased mobility, Difficulty walking, Hypomobility, Obesity, Improper body mechanics, Decreased activity tolerance, Decreased strength, Postural dysfunction  Visit Diagnosis: Abnormality of gait and mobility  Difficulty in walking, not elsewhere classified  Muscle weakness (generalized)     Problem List Patient Active Problem List   Diagnosis Date Noted   Impaired glucose tolerance 03/23/2020   Learning disability 03/23/2020   Urinary and fecal incontinence 06/14/2019   Chronic pain syndrome 03/23/2019   Constipation 03/23/2019   Dysarthria 03/23/2019   Dysphagia 03/23/2019   Learning difficulty 03/23/2019   Morbid obesity ([place]) 03/23/2019   Muscle weakness 03/23/2019   Vitamin D deficiency 03/23/2019   Bilateral carotid artery stenosis 12/15/2018   Moderate aortic valve stenosis 12/15/2018   BMI 45.0-49.9, adult ([place]) 11/10/2018   Cerebrovascular accident ([place]) 05/02/2016   Hemiparesis affecting right side as late effect of cerebrovascular accident ([place]) 05/01/2016   Brain tumor ([place]) 11/24/2014   Gastro-esophageal reflux disease without esophagitis 11/24/2014   Accident due to mechanical fall without injury 08/03/2014   Essential hypertension 06/13/2014   Mixed hyperlipidemia 06/13/2014   Unspecified sequelae of cerebral infarction 06/13/2014   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/2014   Type 2 diabetes mellitus without complication ([place]) 81/44/8185   Neck mass 09/08/2013   Swimmer's ear 09/08/2013   Erectile dysfunction 01/03/2013   Prediabetes 01/03/2013   Janna Arch, PT, DPT  12/06/2020, 1:50 PM  [facility] MAIN [facility] SERVICES [address] [place], [place], [address] Phone: [phone]   Fax:  [phone]  Name: Curtis Powell MRN: [id number] Date of Birth: [birth year]

## 2020-12-13 ENCOUNTER — Ambulatory Visit: Payer: Medicare Other | Attending: Family Medicine

## 2020-12-13 ENCOUNTER — Other Ambulatory Visit: Payer: Self-pay

## 2020-12-13 DIAGNOSIS — R2689 Other abnormalities of gait and mobility: Secondary | ICD-10-CM | POA: Diagnosis present

## 2020-12-13 DIAGNOSIS — R278 Other lack of coordination: Secondary | ICD-10-CM | POA: Diagnosis present

## 2020-12-13 DIAGNOSIS — I639 Cerebral infarction, unspecified: Secondary | ICD-10-CM | POA: Diagnosis present

## 2020-12-13 DIAGNOSIS — R262 Difficulty in walking, not elsewhere classified: Secondary | ICD-10-CM | POA: Insufficient documentation

## 2020-12-13 DIAGNOSIS — R269 Unspecified abnormalities of gait and mobility: Secondary | ICD-10-CM

## 2020-12-13 DIAGNOSIS — I69351 Hemiplegia and hemiparesis following cerebral infarction affecting right dominant side: Secondary | ICD-10-CM | POA: Diagnosis present

## 2020-12-13 DIAGNOSIS — M6281 Muscle weakness (generalized): Secondary | ICD-10-CM

## 2020-12-13 DIAGNOSIS — R531 Weakness: Secondary | ICD-10-CM | POA: Insufficient documentation

## 2020-12-13 DIAGNOSIS — R2681 Unsteadiness on feet: Secondary | ICD-10-CM | POA: Insufficient documentation

## 2020-12-13 NOTE — Therapy (Signed)
[place] MAIN [facility] SERVICES [address] [place], [place], [address] Phone: [phone]   Fax:  [phone]  Physical Therapy Treatment  Patient Details  Name: Curtis Powell MRN: [id number] Date of Birth: 11/18/1965 Referring Provider (PT): Kym Groom MD   Encounter Date: 12/13/2020   PT End of Session - 12/13/20 1319     Visit Number 17    Number of Visits 30    Date for PT Re-Evaluation 01/24/21    Authorization Type start of care 10/04/20    Authorization Time Period PN on 11/15/2020    PT Start Time 1259    PT Stop Time 1344    PT Time Calculation (min) 45 min    Equipment Utilized During Treatment Gait belt    Activity Tolerance Patient tolerated treatment well;Patient limited by fatigue    Behavior During Therapy WFL for tasks assessed/performed             Past Medical History:  Diagnosis Date   GERD (gastroesophageal reflux disease)    Hyperlipidemia    Hypertension    Obesity    Stroke [facility])     Past Surgical History:  Procedure Laterality Date   BRAIN SURGERY      There were no vitals filed for this visit.   Subjective Assessment - 12/13/20 1304     Subjective Patient reports compliance with HEP, no falls or LOB since last session. Has been practicing standing tolerance in restroom.    Pertinent History Patient is a pleasant 55 year old male who presents to physical therapy for weakness and immobility secondary to CVA.  Had a stroke in Feb 2018. He was previously fully independent, but this stroke caused severe residual deficits, mainly on the right side. He reports difficulty walking and decreased mobility. His mom had to move in with him and now is his main caregiver.  Patient had outpatient PT from November 2018-March 2022. Patient reports since being discharged from outpatient therapy he has kept up with his HEP daily. He does report desire for walking better and for getting in and out of bed easier. He reports his  mom is helping with some ADLs. He reports needing help with rolling in bed. He reports his mom/aide assist with dressing. He has a home aide 3 days out of the week;    Limitations Standing;Walking    How long can you sit comfortably? NA    How long can you stand comfortably? <30 min, does minimal standing;    How long can you walk comfortably? depends on the day, fatigue affects his walking; He is walking every day, about 15-20 min;    Diagnostic tests None recent    Patient Stated Goals to walk better and be independent in bed mobility;    Currently in Pain? No/denies                Treatment:    Seated with # 5lb ankle weights  -Seated marches with upright posture, back away from back of chair for abdominal/trunk activation/stabilization, 15x each LE; -Seated LAQ with 3 second holds, 15x each LE, cueing for muscle activation and sequencing for neutral alignment; - heel raise with 3 second hold, 15x; -seated IR/ER 15x each LE;         Ambulate 130 feet today with close CGA. Patient utilized pediatric bariatric RW- Exhibiting short step-through gait pattern with reciprocal steps. VC to maintain his posture and remain close to walker.  Increased external rotation  of R foot Decreased LLE step length as he fatigued. Patient reported moderate fatigue at end of walk. One seated rest break required.   Static stand: shadow box x3 minutes with multiple near LOB    STS 10x from w/c with RW; increasing support required with repetition; min/mod A needed by end of session    Therapeutic Activities:    Toileting (void in standing) performed 1x at end of session with patient requiring assistance with donning doffing pants, stabilizing self, and washing of hands. Pt held onto grab bars for safety and stabilization.   Stand pivot transfer w/c<>chair. Difficulty with pivot steps, maneuvering RW (PT assisted) and foot clearance. PT directed for safe alignment to sitting surface.   Wheeling  into/out of bathroom with focus on lip, requires assistance for entering bathroom, supervision only for exiting.    Pt educated throughout session about proper posture and technique with exercises. Improved exercise technique, movement at target joints, use of target muscles after min to mod verbal, visual, tactile cues.   Patient tolerates progressive stability interventions well. He is challenged with cross body reaching in standing with frequent posterior LOB. Repeated sit to stands relies on heavy UE support and decreased hip extension requiring additional assistance from PT. Patient will benefit from skilled physical therapy to increase strength, mobility, and stability for decreased fall risk and improved ADL ability.                  Upper Extremity Functional Index Score :   /80   PT Education - 12/13/20 1303     Education Details exercise technique, body mechanics    Person(s) Educated Patient    Methods Explanation;Demonstration;Tactile cues;Verbal cues    Comprehension Verbalized understanding;Returned demonstration;Tactile cues required;Verbal cues required              PT Short Term Goals - 11/29/20 1334       PT SHORT TERM GOAL #1   Title Patient will be adherent to HEP at least 3x a week to improve functional strength and balance for better safety at home.    Baseline 11/15/2020- Patient reports performing seated LE hip march, knee ext, sit to stand and walking daily and states no questions. Advised patient to practice static standing using his walker or kitchen counter several times per day without UE support.    Time 4    Period Weeks    Status Achieved    Target Date 11/01/20      PT SHORT TERM GOAL #2   Title Patient will be independent in bed mobility including sit to supine utilizing bed rails as needed for increased mobility at home.    Baseline 6/29: requires min A 8/24: patient reports needing mod A    Time 4    Period Weeks    Status  On-going    Target Date 12/27/20               PT Long Term Goals - 11/29/20 1328       PT LONG TERM GOAL #1   Title Patient will improve FOTO score to > 50% to indicate improve functional mobility at home and in community.    Baseline 6/29: 42% 8/24: 40%    Time 8    Period Weeks    Status On-going    Target Date 01/24/21      PT LONG TERM GOAL #2   Title Patient (< 83 years old) will complete five times sit to stand test  in < 20 seconds indicating an increased LE strength and improved balance.    Baseline 6/29: 23.8 sec with 2HHA. 11/15/2020= 48 sec with BUE Support and CGA using gait belt. 8/24: 23 seconds with 2 HHA    Time 8    Period Weeks    Status Partially Met    Target Date 01/24/21      PT LONG TERM GOAL #3   Title Patient will increase 10 meter walk test to >0.15 m/s (60 sec or less) as to improve gait speed for better home ambulation and to reduce fall risk.    Baseline 6/29: 0.08 m/s with RW. 11/15/2020= 0.093 m/s with RW 8/24: 0.15 m/s    Time 8    Period Weeks    Status Achieved      PT LONG TERM GOAL #4   Title Patient will increase BLE gross strength to 4+/5 as to improve functional strength for independent gait, increased standing tolerance and increased ADL ability.    Baseline 6/29: see flowsheet. 8/24: hips grossly 4-/5 with hip extension 2+/5; L knee flexion 2+/5 knee extension 4/5; R knee 3+/5    Time 8    Period Weeks    Status Partially Met    Target Date 01/24/21      PT LONG TERM GOAL #5   Title Patient will ambulate 250 ft with least assistive device and Supervision with no breaks to allow for increased mobility within home.    Baseline 11/15/2020-Ambulation distance = approx 110 feet using pediatric bariatric walker 8/24: 155 ft with bariatric RW    Time 8    Period Weeks    Status Partially Met    Target Date 01/24/21      Additional Long Term Goals   Additional Long Term Goals Yes      PT LONG TERM GOAL #6   Title Patient will  increase 10 meter walk test to >0.5 m/s ( 30 seconds) as to improve gait speed for better home ambulation and to reduce fall risk.    Baseline 8/24: 61 seconds: 0.15 m/s    Time 8    Period Weeks    Status New    Target Date 01/24/21                   Plan - 12/13/20 1326     Clinical Impression Statement Patient tolerates progressive stability interventions well. He is challenged with cross body reaching in standing with frequent posterior LOB. Repeated sit to stands relies on heavy UE support and decreased hip extension requiring additional assistance from PT. Patient will benefit from skilled physical therapy to increase strength, mobility, and stability for decreased fall risk and improved ADL ability.    Personal Factors and Comorbidities Comorbidity 3+;Education;Past/Current Experience;Time since onset of injury/illness/exacerbation;Transportation    Comorbidities HTN, arthritis, past CVA, high fall risk, obesity    Examination-Activity Limitations Bed Mobility;Locomotion Level;Squat;Stairs;Stand;Toileting;Transfers    Examination-Participation Restrictions Community Activity;Driving;Cleaning;Meal Prep;Shop;Volunteer;Yard Work    Stability/Clinical Decision Making Stable/Uncomplicated    Rehab Potential Good    PT Frequency 2x / week    PT Duration 8 weeks    PT Treatment/Interventions ADLs/Self Care Home Management;Cryotherapy;Moist Heat;Gait training;Stair training;Functional mobility training;Therapeutic activities;Therapeutic exercise;Balance training;Neuromuscular re-education;Patient/family education;Orthotic Fit/Training;Passive range of motion;Energy conservation    PT Next Visit Plan Continue with progressive ambulation, transfer techniques, progressive LE strengthening    PT Home Exercise Plan no updates  this session; Reminder to patient to continue with daily walking  Consulted and Agree with Plan of Care Patient             Patient will benefit from  skilled therapeutic intervention in order to improve the following deficits and impairments:  Abnormal gait, Decreased balance, Decreased endurance, Decreased mobility, Difficulty walking, Hypomobility, Obesity, Improper body mechanics, Decreased activity tolerance, Decreased strength, Postural dysfunction  Visit Diagnosis: Abnormality of gait and mobility  Difficulty in walking, not elsewhere classified  Muscle weakness (generalized)     Problem List Patient Active Problem List   Diagnosis Date Noted   Impaired glucose tolerance 03/23/2020   Learning disability 03/23/2020   Urinary and fecal incontinence 06/14/2019   Chronic pain syndrome 03/23/2019   Constipation 03/23/2019   Dysarthria 03/23/2019   Dysphagia 03/23/2019   Learning difficulty 03/23/2019   Morbid obesity ([place]) 03/23/2019   Muscle weakness 03/23/2019   Vitamin D deficiency 03/23/2019   Bilateral carotid artery stenosis 12/15/2018   Moderate aortic valve stenosis 12/15/2018   BMI 45.0-49.9, adult ([place]) 11/10/2018   Cerebrovascular accident ([place]) 05/02/2016   Hemiparesis affecting right side as late effect of cerebrovascular accident ([place]) 05/01/2016   Brain tumor ([place]) 11/24/2014   Gastro-esophageal reflux disease without esophagitis 11/24/2014   Accident due to mechanical fall without injury 08/03/2014   Essential hypertension 06/13/2014   Mixed hyperlipidemia 06/13/2014   Unspecified sequelae of cerebral infarction 06/13/2014   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 [place], [place], [address] Phone: [phone]   Fax:  [phone]  Name: BRANDELL MAREADY MRN: [id number] Date of Birth: Apr 06, 1966

## 2020-12-18 ENCOUNTER — Ambulatory Visit: Payer: Medicare Other

## 2020-12-18 ENCOUNTER — Other Ambulatory Visit: Payer: Self-pay

## 2020-12-18 DIAGNOSIS — R262 Difficulty in walking, not elsewhere classified: Secondary | ICD-10-CM

## 2020-12-18 DIAGNOSIS — R269 Unspecified abnormalities of gait and mobility: Secondary | ICD-10-CM

## 2020-12-18 DIAGNOSIS — M6281 Muscle weakness (generalized): Secondary | ICD-10-CM

## 2020-12-18 NOTE — Therapy (Signed)
[place] MAIN [facility] SERVICES [address] [place], [place], [address] Phone: [phone]   Fax:  [phone]  Physical Therapy Treatment  Patient Details  Name: Curtis Powell MRN: [id number] Date of Birth: 05-27-1965 Referring Provider (PT): Kym Groom MD   Encounter Date: 12/18/2020   PT End of Session - 12/18/20 1435     Visit Number 18    Number of Visits 30    Date for PT Re-Evaluation 01/24/21    Authorization Type start of care 10/04/20    Authorization Time Period PN on 11/15/2020    PT Start Time 1430    PT Stop Time 1514    PT Time Calculation (min) 44 min    Equipment Utilized During Treatment Gait belt    Activity Tolerance Patient tolerated treatment well;Patient limited by fatigue    Behavior During Therapy WFL for tasks assessed/performed             Past Medical History:  Diagnosis Date   GERD (gastroesophageal reflux disease)    Hyperlipidemia    Hypertension    Obesity    Stroke [facility])     Past Surgical History:  Procedure Laterality Date   BRAIN SURGERY      There were no vitals filed for this visit.   Subjective Assessment - 12/18/20 1434     Subjective Patient reports no falls or LOB since last session. Reports he walked in his apartment with his aide.    Pertinent History Patient is a pleasant 55 year old male who presents to physical therapy for weakness and immobility secondary to CVA.  Had a stroke in Feb 2018. He was previously fully independent, but this stroke caused severe residual deficits, mainly on the right side. He reports difficulty walking and decreased mobility. His mom had to move in with him and now is his main caregiver.  Patient had outpatient PT from November 2018-March 2022. Patient reports since being discharged from outpatient therapy he has kept up with his HEP daily. He does report desire for walking better and for getting in and out of bed easier. He reports his mom is helping with  some ADLs. He reports needing help with rolling in bed. He reports his mom/aide assist with dressing. He has a home aide 3 days out of the week;    Limitations Standing;Walking    How long can you sit comfortably? NA    How long can you stand comfortably? <30 min, does minimal standing;    How long can you walk comfortably? depends on the day, fatigue affects his walking; He is walking every day, about 15-20 min;    Diagnostic tests None recent    Patient Stated Goals to walk better and be independent in bed mobility;    Currently in Pain? No/denies                 Treatment:    Seated with # 5lb ankle weights  -Seated marches with upright posture, back away from back of chair for abdominal/trunk activation/stabilization, 15x each LE; -Seated LAQ with 3 second holds, 15x each LE, cueing for muscle activation and sequencing for neutral alignment; - heel raise with 3 second hold, 15x; -seated IR/ER 15x each LE;       Ambulate 120 feet today with close CGA. Patient utilized pediatric bariatric RW- Exhibiting short step-through gait pattern with reciprocal steps. VC to maintain his posture and remain close to walker.  Increased external rotation of R  foot Decreased LLE step length as he fatigued. Patient reported moderate fatigue at end of walk. One seated rest break required.    Static stand: cross body punches x2 minutes with multiple near LOB x2 trials    STS 10x from w/c with RW; increasing support required with repetition; min/mod A needed by end of session    Seated balloon taps reach inside/outside BOS for stabilization against pertubation's x 3 minutes  Therapeutic Activities:    Toileting (void in standing) performed 1x at end of session with patient requiring assistance with donning doffing pants, stabilizing self, and washing of hands. Pt held onto grab bars for safety and stabilization.   Stand pivot transfer w/c<>chair. Difficulty with pivot steps, maneuvering RW (PT  assisted) and foot clearance. PT directed for safe alignment to sitting surface.   Wheeling into/out of bathroom with focus on lip, requires assistance for entering bathroom, supervision only for exiting.    Pt educated throughout session about proper posture and technique with exercises. Improved exercise technique, movement at target joints, use of target muscles after min to mod verbal, visual, tactile cues.     Patient is highly motivated throughout physical therapy session. Is challenged with RLE fatigue with prolonged ambulation. Heavy use of UE's for sit to stand transfers required. Patient will benefit from skilled physical therapy to increase strength, mobility, and stability for decreased fall risk and improved ADL ability.                    PT Education - 12/18/20 1435     Education Details exercise technique, body mechanics    Person(s) Educated Patient    Methods Explanation;Demonstration;Tactile cues;Verbal cues    Comprehension Verbalized understanding;Returned demonstration;Verbal cues required;Tactile cues required              PT Short Term Goals - 11/29/20 1334       PT SHORT TERM GOAL #1   Title Patient will be adherent to HEP at least 3x a week to improve functional strength and balance for better safety at home.    Baseline 11/15/2020- Patient reports performing seated LE hip march, knee ext, sit to stand and walking daily and states no questions. Advised patient to practice static standing using his walker or kitchen counter several times per day without UE support.    Time 4    Period Weeks    Status Achieved    Target Date 11/01/20      PT SHORT TERM GOAL #2   Title Patient will be independent in bed mobility including sit to supine utilizing bed rails as needed for increased mobility at home.    Baseline 6/29: requires min A 8/24: patient reports needing mod A    Time 4    Period Weeks    Status On-going    Target Date 12/27/20                PT Long Term Goals - 11/29/20 1328       PT LONG TERM GOAL #1   Title Patient will improve FOTO score to > 50% to indicate improve functional mobility at home and in community.    Baseline 6/29: 42% 8/24: 40%    Time 8    Period Weeks    Status On-going    Target Date 01/24/21      PT LONG TERM GOAL #2   Title Patient (< 67 years old) will complete five times sit to stand test in <  20 seconds indicating an increased LE strength and improved balance.    Baseline 6/29: 23.8 sec with 2HHA. 11/15/2020= 48 sec with BUE Support and CGA using gait belt. 8/24: 23 seconds with 2 HHA    Time 8    Period Weeks    Status Partially Met    Target Date 01/24/21      PT LONG TERM GOAL #3   Title Patient will increase 10 meter walk test to >0.15 m/s (60 sec or less) as to improve gait speed for better home ambulation and to reduce fall risk.    Baseline 6/29: 0.08 m/s with RW. 11/15/2020= 0.093 m/s with RW 8/24: 0.15 m/s    Time 8    Period Weeks    Status Achieved      PT LONG TERM GOAL #4   Title Patient will increase BLE gross strength to 4+/5 as to improve functional strength for independent gait, increased standing tolerance and increased ADL ability.    Baseline 6/29: see flowsheet. 8/24: hips grossly 4-/5 with hip extension 2+/5; L knee flexion 2+/5 knee extension 4/5; R knee 3+/5    Time 8    Period Weeks    Status Partially Met    Target Date 01/24/21      PT LONG TERM GOAL #5   Title Patient will ambulate 250 ft with least assistive device and Supervision with no breaks to allow for increased mobility within home.    Baseline 11/15/2020-Ambulation distance = approx 110 feet using pediatric bariatric walker 8/24: 155 ft with bariatric RW    Time 8    Period Weeks    Status Partially Met    Target Date 01/24/21      Additional Long Term Goals   Additional Long Term Goals Yes      PT LONG TERM GOAL #6   Title Patient will increase 10 meter walk test to >0.5 m/s ( 30  seconds) as to improve gait speed for better home ambulation and to reduce fall risk.    Baseline 8/24: 61 seconds: 0.15 m/s    Time 8    Period Weeks    Status New    Target Date 01/24/21                   Plan - 12/18/20 1456     Clinical Impression Statement Patient is highly motivated throughout physical therapy session. Is challenged with RLE fatigue with prolonged ambulation. Heavy use of UE's for sit to stand transfers required. Patient has frequent posterior LOB requiring cueing for stabilization and re-centering in standing. Patient will benefit from skilled physical therapy to increase strength, mobility, and stability for decreased fall risk and improved ADL ability.    Personal Factors and Comorbidities Comorbidity 3+;Education;Past/Current Experience;Time since onset of injury/illness/exacerbation;Transportation    Comorbidities HTN, arthritis, past CVA, high fall risk, obesity    Examination-Activity Limitations Bed Mobility;Locomotion Level;Squat;Stairs;Stand;Toileting;Transfers    Examination-Participation Restrictions Community Activity;Driving;Cleaning;Meal Prep;Shop;Volunteer;Yard Work    Stability/Clinical Decision Making Stable/Uncomplicated    Rehab Potential Good    PT Frequency 2x / week    PT Duration 8 weeks    PT Treatment/Interventions ADLs/Self Care Home Management;Cryotherapy;Moist Heat;Gait training;Stair training;Functional mobility training;Therapeutic activities;Therapeutic exercise;Balance training;Neuromuscular re-education;Patient/family education;Orthotic Fit/Training;Passive range of motion;Energy conservation    PT Next Visit Plan Continue with progressive ambulation, transfer techniques, progressive LE strengthening    PT Home Exercise Plan no updates  this session; Reminder to patient to continue with daily walking  Consulted and Agree with Plan of Care Patient             Patient will benefit from skilled therapeutic intervention in  order to improve the following deficits and impairments:  Abnormal gait, Decreased balance, Decreased endurance, Decreased mobility, Difficulty walking, Hypomobility, Obesity, Improper body mechanics, Decreased activity tolerance, Decreased strength, Postural dysfunction  Visit Diagnosis: Abnormality of gait and mobility  Difficulty in walking, not elsewhere classified  Muscle weakness (generalized)     Problem List Patient Active Problem List   Diagnosis Date Noted   Impaired glucose tolerance 03/23/2020   Learning disability 03/23/2020   Urinary and fecal incontinence 06/14/2019   Chronic pain syndrome 03/23/2019   Constipation 03/23/2019   Dysarthria 03/23/2019   Dysphagia 03/23/2019   Learning difficulty 03/23/2019   Morbid obesity ([place]) 03/23/2019   Muscle weakness 03/23/2019   Vitamin D deficiency 03/23/2019   Bilateral carotid artery stenosis 12/15/2018   Moderate aortic valve stenosis 12/15/2018   BMI 45.0-49.9, adult ([place]) 11/10/2018   Cerebrovascular accident ([place]) 05/02/2016   Hemiparesis affecting right side as late effect of cerebrovascular accident ([place]) 05/01/2016   Brain tumor ([place]) 11/24/2014   Gastro-esophageal reflux disease without esophagitis 11/24/2014   Accident due to mechanical fall without injury 08/03/2014   Essential hypertension 06/13/2014   Mixed hyperlipidemia 06/13/2014   Unspecified sequelae of cerebral infarction 06/13/2014   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

## 2020-12-20 ENCOUNTER — Ambulatory Visit: Payer: Medicare Other | Admitting: Physical Therapy

## 2020-12-20 ENCOUNTER — Other Ambulatory Visit: Payer: Self-pay

## 2020-12-20 DIAGNOSIS — R2681 Unsteadiness on feet: Secondary | ICD-10-CM

## 2020-12-20 DIAGNOSIS — M6281 Muscle weakness (generalized): Secondary | ICD-10-CM

## 2020-12-20 DIAGNOSIS — R269 Unspecified abnormalities of gait and mobility: Secondary | ICD-10-CM

## 2020-12-20 DIAGNOSIS — I639 Cerebral infarction, unspecified: Secondary | ICD-10-CM

## 2020-12-20 DIAGNOSIS — R262 Difficulty in walking, not elsewhere classified: Secondary | ICD-10-CM

## 2020-12-20 DIAGNOSIS — R531 Weakness: Secondary | ICD-10-CM

## 2020-12-20 DIAGNOSIS — R278 Other lack of coordination: Secondary | ICD-10-CM

## 2020-12-20 DIAGNOSIS — I69351 Hemiplegia and hemiparesis following cerebral infarction affecting right dominant side: Secondary | ICD-10-CM

## 2020-12-20 DIAGNOSIS — R2689 Other abnormalities of gait and mobility: Secondary | ICD-10-CM

## 2020-12-20 NOTE — Therapy (Signed)
[name] MAIN [facility] SERVICES [address] [place], [place], [address] Phone: [phone]   Fax:  [phone]  Physical Therapy Treatment  Patient Details  Name: Curtis Powell MRN: [id number] Date of Birth: November 14, 1965 Referring Provider (PT): Kym Groom MD   Encounter Date: 12/20/2020   PT End of Session - 12/20/20 1636     Visit Number 19    Number of Visits 30    Date for PT Re-Evaluation 01/24/21    Authorization Type start of care 10/04/20    Authorization Time Period PN on 11/15/2020    PT Start Time 1432    PT Stop Time 1515    PT Time Calculation (min) 43 min    Equipment Utilized During Treatment Gait belt    Activity Tolerance Patient tolerated treatment well;Patient limited by fatigue    Behavior During Therapy WFL for tasks assessed/performed             Past Medical History:  Diagnosis Date   GERD (gastroesophageal reflux disease)    Hyperlipidemia    Hypertension    Obesity    Stroke [facility])     Past Surgical History:  Procedure Laterality Date   BRAIN SURGERY      There were no vitals filed for this visit.   Subjective Assessment - 12/20/20 1437     Subjective Patient reports no falls or LOB since last session. States he walked with his aide since last session. No questions or concerns. States he has no plans for his birthday tomorrow.    Pertinent History Patient is a pleasant 55 year old male who presents to physical therapy for weakness and immobility secondary to CVA.  Had a stroke in Feb 2018. He was previously fully independent, but this stroke caused severe residual deficits, mainly on the right side. He reports difficulty walking and decreased mobility. His mom had to move in with him and now is his main caregiver.  Patient had outpatient PT from November 2018-March 2022. Patient reports since being discharged from outpatient therapy he has kept up with his HEP daily. He does report desire for walking better  and for getting in and out of bed easier. He reports his mom is helping with some ADLs. He reports needing help with rolling in bed. He reports his mom/aide assist with dressing. He has a home aide 3 days out of the week;    Limitations Standing;Walking    How long can you sit comfortably? NA    How long can you stand comfortably? <30 min, does minimal standing;    How long can you walk comfortably? depends on the day, fatigue affects his walking; He is walking every day, about 15-20 min;    Diagnostic tests None recent    Patient Stated Goals to walk better and be independent in bed mobility;    Currently in Pain? No/denies             Treatment:    Seated with # 5lb ankle weights  -Seated marches with upright posture, back away from back of chair for abdominal/trunk activation/stabilization, 15x each LE; -Seated LAQ with 3 second holds, 15x each LE, cueing for muscle activation and sequencing for neutral alignment; - heel raise with 3 second hold, 15x; -seated IR/ER 15x each LE;       Ambulate 100 feet today with close CGA. Patient utilized pediatric bariatric RW- Exhibiting short step-through gait pattern with reciprocal steps. Increased time in double limb stance as  pt progresses RW forward. VC to maintain his posture.  Increased external rotation of R foot, decreased LLE step length as he fatigued. Patient reported significant fatigue in BLE at end of walk; pt declined further ambulation .     Therapeutic Activities:    Toileting (void in standing) performed 1x at end of session with patient requiring assistance with donning doffing pants, stabilizing self, and washing of hands. Pt held onto grab bars for safety and stabilization.   Stand pivot transfer w/c<>chair. Difficulty with pivot steps, maneuvering RW (PT assisted) and foot clearance. PT directed for safe alignment to sitting surface.   STS transfer - performed 3 reps throughout session. Multiple attempts from standard  chair, MOD A required for all to lift and heavy stabilization upon initial stand.   Pt educated throughout session about proper posture and technique with exercises. Improved exercise technique, movement at target joints, use of target muscles after min to mod verbal, visual, tactile cues.       Patient is pleasant and highly motivated throughout physical therapy session. He accomplished 172f of ambulation prior to rest break. He is challenged with BLE fatigue, however right fatigues prior to left. He also stated fatigue in BUE after ambulation. Heavy use of UE's for sit to stand transfers required with MOD A to lift and steady. Increased time required for toileting at end of session. Patient will benefit from skilled physical therapy to increase strength, mobility, and stability for decreased fall risk and improved ADL ability.       PT Short Term Goals - 11/29/20 1334       PT SHORT TERM GOAL #1   Title Patient will be adherent to HEP at least 3x a week to improve functional strength and balance for better safety at home.    Baseline 11/15/2020- Patient reports performing seated LE hip march, knee ext, sit to stand and walking daily and states no questions. Advised patient to practice static standing using his walker or kitchen counter several times per day without UE support.    Time 4    Period Weeks    Status Achieved    Target Date 11/01/20      PT SHORT TERM GOAL #2   Title Patient will be independent in bed mobility including sit to supine utilizing bed rails as needed for increased mobility at home.    Baseline 6/29: requires min A 8/24: patient reports needing mod A    Time 4    Period Weeks    Status On-going    Target Date 12/27/20               PT Long Term Goals - 11/29/20 1328       PT LONG TERM GOAL #1   Title Patient will improve FOTO score to > 50% to indicate improve functional mobility at home and in community.    Baseline 6/29: 42% 8/24: 40%    Time 8     Period Weeks    Status On-going    Target Date 01/24/21      PT LONG TERM GOAL #2   Title Patient (< 655years old) will complete five times sit to stand test in < 20 seconds indicating an increased LE strength and improved balance.    Baseline 6/29: 23.8 sec with 2HHA. 11/15/2020= 48 sec with BUE Support and CGA using gait belt. 8/24: 23 seconds with 2 HHA    Time 8    Period Weeks  Status Partially Met    Target Date 01/24/21      PT LONG TERM GOAL #3   Title Patient will increase 10 meter walk test to >0.15 m/s (60 sec or less) as to improve gait speed for better home ambulation and to reduce fall risk.    Baseline 6/29: 0.08 m/s with RW. 11/15/2020= 0.093 m/s with RW 8/24: 0.15 m/s    Time 8    Period Weeks    Status Achieved      PT LONG TERM GOAL #4   Title Patient will increase BLE gross strength to 4+/5 as to improve functional strength for independent gait, increased standing tolerance and increased ADL ability.    Baseline 6/29: see flowsheet. 8/24: hips grossly 4-/5 with hip extension 2+/5; L knee flexion 2+/5 knee extension 4/5; R knee 3+/5    Time 8    Period Weeks    Status Partially Met    Target Date 01/24/21      PT LONG TERM GOAL #5   Title Patient will ambulate 250 ft with least assistive device and Supervision with no breaks to allow for increased mobility within home.    Baseline 11/15/2020-Ambulation distance = approx 110 feet using pediatric bariatric walker 8/24: 155 ft with bariatric RW    Time 8    Period Weeks    Status Partially Met    Target Date 01/24/21      Additional Long Term Goals   Additional Long Term Goals Yes      PT LONG TERM GOAL #6   Title Patient will increase 10 meter walk test to >0.5 m/s ( 30 seconds) as to improve gait speed for better home ambulation and to reduce fall risk.    Baseline 8/24: 61 seconds: 0.15 m/s    Time 8    Period Weeks    Status New    Target Date 01/24/21                   Plan - 12/20/20  1637     Clinical Impression Statement Patient is pleasant and highly motivated throughout physical therapy session. He accomplished 146f of ambulation prior to rest break. He is challenged with BLE fatigue, however right fatigues prior to left. He also stated fatigue in BUE after ambulation. Heavy use of UE's for sit to stand transfers required with MOD A to lift and steady. Increased time required for toileting at end of session.Patient will benefit from skilled physical therapy to increase strength, mobility, and stability for decreased fall risk and improved ADL ability.    Personal Factors and Comorbidities Comorbidity 3+;Education;Past/Current Experience;Time since onset of injury/illness/exacerbation;Transportation    Comorbidities HTN, arthritis, past CVA, high fall risk, obesity    Examination-Activity Limitations Bed Mobility;Locomotion Level;Squat;Stairs;Stand;Toileting;Transfers    Examination-Participation Restrictions Community Activity;Driving;Cleaning;Meal Prep;Shop;Volunteer;Yard Work    Stability/Clinical Decision Making Stable/Uncomplicated    Rehab Potential Good    PT Frequency 2x / week    PT Duration 8 weeks    PT Treatment/Interventions ADLs/Self Care Home Management;Cryotherapy;Moist Heat;Gait training;Stair training;Functional mobility training;Therapeutic activities;Therapeutic exercise;Balance training;Neuromuscular re-education;Patient/family education;Orthotic Fit/Training;Passive range of motion;Energy conservation    PT Next Visit Plan Continue with progressive ambulation, transfer techniques, progressive LE strengthening    PT Home Exercise Plan no updates  this session; Reminder to patient to continue with daily walking    Consulted and Agree with Plan of Care Patient             Patient will benefit from  skilled therapeutic intervention in order to improve the following deficits and impairments:  Abnormal gait, Decreased balance, Decreased endurance,  Decreased mobility, Difficulty walking, Hypomobility, Obesity, Improper body mechanics, Decreased activity tolerance, Decreased strength, Postural dysfunction  Visit Diagnosis: Abnormality of gait and mobility  Difficulty in walking, not elsewhere classified  Muscle weakness (generalized)  Other abnormalities of gait and mobility  Unsteadiness on feet  Other lack of coordination  Left-sided weakness  Hemiplegia and hemiparesis following cerebral infarction affecting right dominant side (HCC)  Acute CVA (cerebrovascular accident) [facility])     Problem List Patient Active Problem List   Diagnosis Date Noted   Impaired glucose tolerance 03/23/2020   Learning disability 03/23/2020   Urinary and fecal incontinence 06/14/2019   Chronic pain syndrome 03/23/2019   Constipation 03/23/2019   Dysarthria 03/23/2019   Dysphagia 03/23/2019   Learning difficulty 03/23/2019   Morbid obesity ([place]) 03/23/2019   Muscle weakness 03/23/2019   Vitamin D deficiency 03/23/2019   Bilateral carotid artery stenosis 12/15/2018   Moderate aortic valve stenosis 12/15/2018   BMI 45.0-49.9, adult ([place]) 11/10/2018   Cerebrovascular accident ([place]) 05/02/2016   Hemiparesis affecting right side as late effect of cerebrovascular accident ([place]) 05/01/2016   Brain tumor ([place]) 11/24/2014   Gastro-esophageal reflux disease without esophagitis 11/24/2014   Accident due to mechanical fall without injury 08/03/2014   Essential hypertension 06/13/2014   Mixed hyperlipidemia 06/13/2014   Unspecified sequelae of cerebral infarction 06/13/2014   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/2014   Type 2 diabetes mellitus without complication ([place]) 31/28/1188   Neck mass 09/08/2013   Swimmer's ear 09/08/2013   Erectile dysfunction 01/03/2013   Prediabetes 01/03/2013    Patrina Levering PT, DPT  Ramonita Lab, PT 12/20/2020, 4:46 PM   [facility] MAIN [facility] SERVICES [address] [place], [place],  [address] Phone: [phone]   Fax:  [phone]  Name: Curtis Powell MRN: [id number] Date of Birth: 1966-03-18

## 2020-12-25 ENCOUNTER — Ambulatory Visit: Payer: Medicare Other

## 2020-12-25 ENCOUNTER — Other Ambulatory Visit: Payer: Self-pay

## 2020-12-25 DIAGNOSIS — R2689 Other abnormalities of gait and mobility: Secondary | ICD-10-CM

## 2020-12-25 DIAGNOSIS — I639 Cerebral infarction, unspecified: Secondary | ICD-10-CM

## 2020-12-25 DIAGNOSIS — R262 Difficulty in walking, not elsewhere classified: Secondary | ICD-10-CM

## 2020-12-25 DIAGNOSIS — R278 Other lack of coordination: Secondary | ICD-10-CM

## 2020-12-25 DIAGNOSIS — R531 Weakness: Secondary | ICD-10-CM

## 2020-12-25 DIAGNOSIS — R2681 Unsteadiness on feet: Secondary | ICD-10-CM

## 2020-12-25 DIAGNOSIS — R269 Unspecified abnormalities of gait and mobility: Secondary | ICD-10-CM

## 2020-12-25 DIAGNOSIS — M6281 Muscle weakness (generalized): Secondary | ICD-10-CM

## 2020-12-25 DIAGNOSIS — I69351 Hemiplegia and hemiparesis following cerebral infarction affecting right dominant side: Secondary | ICD-10-CM

## 2020-12-25 NOTE — Therapy (Addendum)
[place] MAIN [facility] SERVICES [address] [place], [place], [address] Phone: [phone]   Fax:  [phone]  Physical Therapy Treatment Physical Therapy Progress Note   Dates of reporting period  11/15/20   to   12/25/20   Patient Details   Name: Curtis Powell MRN: [id number] Date of Birth: 02/25/1966 Referring Provider (PT): Kym Groom MD   Encounter Date: 12/25/2020   PT End of Session - 12/25/20 1449     Visit Number 20    Number of Visits 30    Date for PT Re-Evaluation 01/24/21    Authorization Type [employer] Medicare; [place] Medicaid    PT Start Time 1435    PT Stop Time 1515    PT Time Calculation (min) 40 min    Equipment Utilized During Treatment Gait belt    Activity Tolerance Patient tolerated treatment well;Patient limited by fatigue    Behavior During Therapy WFL for tasks assessed/performed             Past Medical History:  Diagnosis Date   GERD (gastroesophageal reflux disease)    Hyperlipidemia    Hypertension    Obesity    Stroke [facility])     Past Surgical History:  Procedure Laterality Date   BRAIN SURGERY      There were no vitals filed for this visit.   Subjective Assessment - 12/25/20 1440     Subjective Pt reports doing well today. Had a steak and baked potato for his brithday. No other updates.    Pertinent History Patient is a pleasant 55 year old male who presents to physical therapy for weakness and immobility secondary to CVA.  Had a stroke in Feb 2018. He was previously fully independent, but this stroke caused severe residual deficits, mainly on the right side. He reports difficulty walking and decreased mobility. His mom had to move in with him and now is his main caregiver.  Patient had outpatient PT from November 2018-March 2022. Patient reports since being discharged from outpatient therapy he has kept up with his HEP daily. He does report desire for walking better and for getting in and out of bed  easier. He reports his mom is helping with some ADLs. He reports needing help with rolling in bed. He reports his mom/aide assist with dressing. He has a home aide 3 days out of the week;    Currently in Pain? No/denies                [facility] PT Assessment - 12/25/20 0001       Observation/Other Assessments   Focus on Therapeutic Outcomes (FOTO)  56   42 on 6/29     Transfers   Five time sit to stand comments  69.81sec   from  pt WC, pediatric height walker, WC needs a block posteriorly;     Standardized Balance Assessment   Five times sit to stand comments  69.81sec   from  pt WC, pediatric height walker, WC needs a block posteriorly;   10 Meter Walk 110sec, 0.22ms   0.08 m/s with RW (111.8 sec); high fall risk, homebound              Intervention:  -STS from WC to RW, 5x60sec -5xSTS #2 for balance and technique practice.  -AMB 360fc standard low RW         PT Education - 12/25/20 1440     Education Details Reassessmen ttesting procedures    Person(s)  Educated Patient    Methods Demonstration;Verbal cues    Comprehension Verbalized understanding              PT Short Term Goals - 11/29/20 1334       PT SHORT TERM GOAL #1   Title Patient will be adherent to HEP at least 3x a week to improve functional strength and balance for better safety at home.    Baseline 11/15/2020- Patient reports performing seated LE hip march, knee ext, sit to stand and walking daily and states no questions. Advised patient to practice static standing using his walker or kitchen counter several times per day without UE support.    Time 4    Period Weeks    Status Achieved    Target Date 11/01/20      PT SHORT TERM GOAL #2   Title Patient will be independent in bed mobility including sit to supine utilizing bed rails as needed for increased mobility at home.    Baseline 6/29: requires min A 8/24: patient reports needing mod A    Time 4    Period Weeks    Status On-going     Target Date 12/27/20               PT Long Term Goals - 11/29/20 1328       PT LONG TERM GOAL #1   Title Patient will improve FOTO score to > 50% to indicate improve functional mobility at home and in community.    Baseline 6/29: 42% 8/24: 40%    Time 8    Period Weeks    Status On-going    Target Date 01/24/21      PT LONG TERM GOAL #2   Title Patient (< 23 years old) will complete five times sit to stand test in < 20 seconds indicating an increased LE strength and improved balance.    Baseline 6/29: 23.8 sec with 2HHA. 11/15/2020= 48 sec with BUE Support and CGA using gait belt. 8/24: 23 seconds with 2 HHA    Time 8    Period Weeks    Status Partially Met    Target Date 01/24/21      PT LONG TERM GOAL #3   Title Patient will increase 10 meter walk test to >0.15 m/s (60 sec or less) as to improve gait speed for better home ambulation and to reduce fall risk.    Baseline 6/29: 0.08 m/s with RW. 11/15/2020= 0.093 m/s with RW 8/24: 0.15 m/s    Time 8    Period Weeks    Status Achieved      PT LONG TERM GOAL #4   Title Patient will increase BLE gross strength to 4+/5 as to improve functional strength for independent gait, increased standing tolerance and increased ADL ability.    Baseline 6/29: see flowsheet. 8/24: hips grossly 4-/5 with hip extension 2+/5; L knee flexion 2+/5 knee extension 4/5; R knee 3+/5    Time 8    Period Weeks    Status Partially Met    Target Date 01/24/21      PT LONG TERM GOAL #5   Title Patient will ambulate 250 ft with least assistive device and Supervision with no breaks to allow for increased mobility within home.    Baseline 11/15/2020-Ambulation distance = approx 110 feet using pediatric bariatric walker 8/24: 155 ft with bariatric RW    Time 8    Period Weeks    Status Partially Met  Target Date 01/24/21      Additional Long Term Goals   Additional Long Term Goals Yes      PT LONG TERM GOAL #6   Title Patient will increase 10 meter  walk test to >0.5 m/s ( 30 seconds) as to improve gait speed for better home ambulation and to reduce fall risk.    Baseline 8/24: 61 seconds: 0.15 m/s    Time 8    Period Weeks    Status New    Target Date 01/24/21                   Plan - 12/25/20 1450     Clinical Impression Statement Outcome measures revisited this date, improvement seen in FOTO score, 10MWT without significant change. 5xSTS value is different however testing protocol is varried from eval testing. Pt remains motivated to improve his strength and mobility overall. He continues to demonstrate sizeable impairment and limitations compared to his most recent baseline level. Given his success in previous work with PT here, anticipate pt still has good prognosis for improved function going forward.    Personal Factors and Comorbidities Comorbidity 3+;Education;Past/Current Experience;Time since onset of injury/illness/exacerbation;Transportation    Comorbidities HTN, arthritis, past CVA, high fall risk, obesity    Examination-Activity Limitations Bed Mobility;Locomotion Level;Squat;Stairs;Stand;Toileting;Transfers    Examination-Participation Restrictions Community Activity;Driving;Cleaning;Meal Prep;Shop;Volunteer;Yard Work    Stability/Clinical Decision Making Stable/Uncomplicated    Designer, jewellery Low    Rehab Potential Good    PT Frequency 2x / week    PT Duration 8 weeks    PT Treatment/Interventions ADLs/Self Care Home Management;Cryotherapy;Moist Heat;Gait training;Stair training;Functional mobility training;Therapeutic activities;Therapeutic exercise;Balance training;Neuromuscular re-education;Patient/family education;Orthotic Fit/Training;Passive range of motion;Energy conservation    PT Next Visit Plan General strengthening, advance safety/technique/independence with STS transfers, progress AMB if able    PT Home Exercise Plan no updates  this session;    Consulted and Agree with Plan of Care Patient              Patient will benefit from skilled therapeutic intervention in order to improve the following deficits and impairments:  Abnormal gait, Decreased balance, Decreased endurance, Decreased mobility, Difficulty walking, Hypomobility, Obesity, Improper body mechanics, Decreased activity tolerance, Decreased strength, Postural dysfunction  Visit Diagnosis: Abnormality of gait and mobility  Difficulty in walking, not elsewhere classified  Muscle weakness (generalized)  Other abnormalities of gait and mobility  Unsteadiness on feet  Other lack of coordination  Left-sided weakness  Hemiplegia and hemiparesis following cerebral infarction affecting right dominant side (HCC)  Acute CVA (cerebrovascular accident) [facility])     Problem List Patient Active Problem List   Diagnosis Date Noted   Impaired glucose tolerance 03/23/2020   Learning disability 03/23/2020   Urinary and fecal incontinence 06/14/2019   Chronic pain syndrome 03/23/2019   Constipation 03/23/2019   Dysarthria 03/23/2019   Dysphagia 03/23/2019   Learning difficulty 03/23/2019   Morbid obesity ([place]) 03/23/2019   Muscle weakness 03/23/2019   Vitamin D deficiency 03/23/2019   Bilateral carotid artery stenosis 12/15/2018   Moderate aortic valve stenosis 12/15/2018   BMI 45.0-49.9, adult ([place]) 11/10/2018   Cerebrovascular accident ([place]) 05/02/2016   Hemiparesis affecting right side as late effect of cerebrovascular accident ([place]) 05/01/2016   Brain tumor ([place]) 11/24/2014   Gastro-esophageal reflux disease without esophagitis 11/24/2014   Accident due to mechanical fall without injury 08/03/2014   Essential hypertension 06/13/2014   Mixed hyperlipidemia 06/13/2014   Unspecified sequelae of cerebral infarction 06/13/2014   Thyromegaly  01/07/2014   Type 2 diabetes mellitus without complication ([place]) 84/06/9793   Neck mass 09/08/2013   Swimmer's ear 09/08/2013   Erectile dysfunction 01/03/2013    Prediabetes 01/03/2013   6:04 PM, 12/25/20 Etta Grandchild, PT, DPT Physical Therapist - [facility]  Outpatient Physical Therapy- [place] [phone]     [place], [place] 12/25/2020, 3:11 PM  [place] MAIN [facility] SERVICES [address] [place], [place], [address] Phone: [phone]   Fax:  [phone]  Name: Curtis [place] MRN: [id number] Date of Birth: [birth year]

## 2020-12-27 ENCOUNTER — Ambulatory Visit: Payer: Medicare Other

## 2020-12-27 ENCOUNTER — Other Ambulatory Visit: Payer: Self-pay

## 2020-12-27 DIAGNOSIS — R269 Unspecified abnormalities of gait and mobility: Secondary | ICD-10-CM

## 2020-12-27 DIAGNOSIS — M6281 Muscle weakness (generalized): Secondary | ICD-10-CM

## 2020-12-27 DIAGNOSIS — R262 Difficulty in walking, not elsewhere classified: Secondary | ICD-10-CM

## 2020-12-27 NOTE — Therapy (Signed)
[place] MAIN [facility] SERVICES [address] [place], [place], [address] Phone: [phone]   Fax:  [phone]  Physical Therapy Treatment  Patient Details  Name: Curtis Powell MRN: [id number] Date of Birth: [birth year] Referring Provider (PT): Kym Groom MD   Encounter Date: 12/27/2020   PT End of Session - 12/27/20 1253     Visit Number 21    Number of Visits 30    Date for PT Re-Evaluation 01/24/21    Authorization Type UHC Medicare; [place] Medicaid    PT Start Time 1259    PT Stop Time 1344    PT Time Calculation (min) 45 min    Equipment Utilized During Treatment Gait belt    Activity Tolerance Patient tolerated treatment well;Patient limited by fatigue    Behavior During Therapy WFL for tasks assessed/performed             Past Medical History:  Diagnosis Date   GERD (gastroesophageal reflux disease)    Hyperlipidemia    Hypertension    Obesity    Stroke [facility])     Past Surgical History:  Procedure Laterality Date   BRAIN SURGERY      There were no vitals filed for this visit.   Subjective Assessment - 12/27/20 1302     Subjective Patient reports he did some short distance walking with his aide. No falls or LOB, no pain reported but does feel fatigued and like he has a cold.    Pertinent History Patient is a pleasant 55 year old male who presents to physical therapy for weakness and immobility secondary to CVA.  Had a stroke in Feb 2018. He was previously fully independent, but this stroke caused severe residual deficits, mainly on the right side. He reports difficulty walking and decreased mobility. His mom had to move in with him and now is his main caregiver.  Patient had outpatient PT from November 2018-March 2022. Patient reports since being discharged from outpatient therapy he has kept up with his HEP daily. He does report desire for walking better and for getting in and out of bed easier. He reports his mom is helping with  some ADLs. He reports needing help with rolling in bed. He reports his mom/aide assist with dressing. He has a home aide 3 days out of the week;    Limitations Standing;Walking    How long can you sit comfortably? NA    How long can you stand comfortably? <30 min, does minimal standing;    How long can you walk comfortably? depends on the day, fatigue affects his walking; He is walking every day, about 15-20 min;    Diagnostic tests None recent    Patient Stated Goals to walk better and be independent in bed mobility;    Currently in Pain? No/denies                Treatment:    Seated with # 4lb ankle weights  -Seated marches with upright posture, back away from back of chair for abdominal/trunk activation/stabilization, 15x each LE; 2 sets  -Seated LAQ with 3 second holds, 15x each LE, cueing for muscle activation and sequencing for neutral alignment; 2 sets - heel raise with 3 second hold, 15x; -seated IR/ER 15x each LE; 2 sets    Seated: -GTB hamstring curl 15x each LE x2 trials  -GTB row 15x ;  -GTB adduction 15x each LE  3lb weighted bar: -chest press 15x cue for upright posture to  challenge core stabilization. ; x 2 sets  -overhead press 12x cues for upright position and bilateral UE pressing ; very challenging with RUE ; x 2 sets    unable to ambulate due to inability to retain standing position. Immediate posterior LOB upon 3 separate attempts with mod/max A.    Therapeutic Activities:    Toileting (void in standing) performed 1x at end of session with patient requiring assistance with donning doffing pants, stabilizing self, and washing of hands. Pt held onto grab bars for safety and stabilization.   STS transfer - performed 3 reps throughout session. Multiple attempts from standard chair, MOD A required for all to lift and heavy stabilization upon initial stand.   Pt educated throughout session about proper posture and technique with exercises. Improved exercise  technique, movement at target joints, use of target muscles after min to mod verbal, visual, tactile cues.   Patient unable to maintain standing position this session and unable to walk. Patient reports severe fatigue and feeling mildly ill. Patient tolerated seated interventions well with increased need for rest breaks this session. Vitals monitored with HR elevation noted throughout session.  Patient will benefit from skilled physical therapy to increase strength, mobility, and stability for decreased fall risk and improved ADL ability.                     PT Education - 12/27/20 1252     Education Details exercise technique, body mechanics    Person(s) Educated Patient    Methods Explanation;Demonstration;Tactile cues;Verbal cues    Comprehension Verbalized understanding;Returned demonstration;Verbal cues required;Tactile cues required              PT Short Term Goals - 11/29/20 1334       PT SHORT TERM GOAL #1   Title Patient will be adherent to HEP at least 3x a week to improve functional strength and balance for better safety at home.    Baseline 11/15/2020- Patient reports performing seated LE hip march, knee ext, sit to stand and walking daily and states no questions. Advised patient to practice static standing using his walker or kitchen counter several times per day without UE support.    Time 4    Period Weeks    Status Achieved    Target Date 11/01/20      PT SHORT TERM GOAL #2   Title Patient will be independent in bed mobility including sit to supine utilizing bed rails as needed for increased mobility at home.    Baseline 6/29: requires min A 8/24: patient reports needing mod A    Time 4    Period Weeks    Status On-going    Target Date 12/27/20               PT Long Term Goals - 11/29/20 1328       PT LONG TERM GOAL #1   Title Patient will improve FOTO score to > 50% to indicate improve functional mobility at home and in community.     Baseline 6/29: 42% 8/24: 40%    Time 8    Period Weeks    Status On-going    Target Date 01/24/21      PT LONG TERM GOAL #2   Title Patient (< 36 years old) will complete five times sit to stand test in < 20 seconds indicating an increased LE strength and improved balance.    Baseline 6/29: 23.8 sec with 2HHA. 11/15/2020= 48 sec with  BUE Support and CGA using gait belt. 8/24: 23 seconds with 2 HHA    Time 8    Period Weeks    Status Partially Met    Target Date 01/24/21      PT LONG TERM GOAL #3   Title Patient will increase 10 meter walk test to >0.15 m/s (60 sec or less) as to improve gait speed for better home ambulation and to reduce fall risk.    Baseline 6/29: 0.08 m/s with RW. 11/15/2020= 0.093 m/s with RW 8/24: 0.15 m/s    Time 8    Period Weeks    Status Achieved      PT LONG TERM GOAL #4   Title Patient will increase BLE gross strength to 4+/5 as to improve functional strength for independent gait, increased standing tolerance and increased ADL ability.    Baseline 6/29: see flowsheet. 8/24: hips grossly 4-/5 with hip extension 2+/5; L knee flexion 2+/5 knee extension 4/5; R knee 3+/5    Time 8    Period Weeks    Status Partially Met    Target Date 01/24/21      PT LONG TERM GOAL #5   Title Patient will ambulate 250 ft with least assistive device and Supervision with no breaks to allow for increased mobility within home.    Baseline 11/15/2020-Ambulation distance = approx 110 feet using pediatric bariatric walker 8/24: 155 ft with bariatric RW    Time 8    Period Weeks    Status Partially Met    Target Date 01/24/21      Additional Long Term Goals   Additional Long Term Goals Yes      PT LONG TERM GOAL #6   Title Patient will increase 10 meter walk test to >0.5 m/s ( 30 seconds) as to improve gait speed for better home ambulation and to reduce fall risk.    Baseline 8/24: 61 seconds: 0.15 m/s    Time 8    Period Weeks    Status New    Target Date 01/24/21                    Plan - 12/27/20 1319     Clinical Impression Statement Patient unable to maintain standing position this session and unable to walk. Patient reports severe fatigue and feeling mildly ill. Patient tolerated seated interventions well with increased need for rest breaks this session. Vitals monitored with HR elevation noted throughout session.  Patient will benefit from skilled physical therapy to increase strength, mobility, and stability for decreased fall risk and improved ADL ability.    Personal Factors and Comorbidities Comorbidity 3+;Education;Past/Current Experience;Time since onset of injury/illness/exacerbation;Transportation    Comorbidities HTN, arthritis, past CVA, high fall risk, obesity    Examination-Activity Limitations Bed Mobility;Locomotion Level;Squat;Stairs;Stand;Toileting;Transfers    Examination-Participation Restrictions Community Activity;Driving;Cleaning;Meal Prep;Shop;Volunteer;Yard Work    Stability/Clinical Decision Making Stable/Uncomplicated    Rehab Potential Good    PT Frequency 2x / week    PT Duration 8 weeks    PT Treatment/Interventions ADLs/Self Care Home Management;Cryotherapy;Moist Heat;Gait training;Stair training;Functional mobility training;Therapeutic activities;Therapeutic exercise;Balance training;Neuromuscular re-education;Patient/family education;Orthotic Fit/Training;Passive range of motion;Energy conservation    PT Next Visit Plan Continue with progressive ambulation, transfer techniques, progressive LE strengthening    PT Home Exercise Plan no updates  this session; Reminder to patient to continue with daily walking    Consulted and Agree with Plan of Care Patient  Patient will benefit from skilled therapeutic intervention in order to improve the following deficits and impairments:  Abnormal gait, Decreased balance, Decreased endurance, Decreased mobility, Difficulty walking, Hypomobility, Obesity, Improper body  mechanics, Decreased activity tolerance, Decreased strength, Postural dysfunction  Visit Diagnosis: Abnormality of gait and mobility  Difficulty in walking, not elsewhere classified  Muscle weakness (generalized)     Problem List Patient Active Problem List   Diagnosis Date Noted   Impaired glucose tolerance 03/23/2020   Learning disability 03/23/2020   Urinary and fecal incontinence 06/14/2019   Chronic pain syndrome 03/23/2019   Constipation 03/23/2019   Dysarthria 03/23/2019   Dysphagia 03/23/2019   Learning difficulty 03/23/2019   Morbid obesity ([place]) 03/23/2019   Muscle weakness 03/23/2019   Vitamin D deficiency 03/23/2019   Bilateral carotid artery stenosis 12/15/2018   Moderate aortic valve stenosis 12/15/2018   BMI 45.0-49.9, adult ([place]) 11/10/2018   Cerebrovascular accident ([place]) 05/02/2016   Hemiparesis affecting right side as late effect of cerebrovascular accident ([place]) 05/01/2016   Brain tumor ([place]) 11/24/2014   Gastro-esophageal reflux disease without esophagitis 11/24/2014   Accident due to mechanical fall without injury 08/03/2014   Essential hypertension 06/13/2014   Mixed hyperlipidemia 06/13/2014   Unspecified sequelae of cerebral infarction 06/13/2014   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 [place], [place], [address] Phone: [phone]   Fax:  [phone]  Name: Curtis Powell MRN: [id number] Date of Birth: 1965-09-04

## 2021-01-01 ENCOUNTER — Ambulatory Visit: Payer: Medicare Other

## 2021-01-03 ENCOUNTER — Ambulatory Visit: Payer: Medicare Other

## 2021-01-08 ENCOUNTER — Ambulatory Visit: Payer: Medicare Other | Attending: Family Medicine

## 2021-01-08 ENCOUNTER — Other Ambulatory Visit: Payer: Self-pay

## 2021-01-08 DIAGNOSIS — R269 Unspecified abnormalities of gait and mobility: Secondary | ICD-10-CM | POA: Diagnosis not present

## 2021-01-08 DIAGNOSIS — I639 Cerebral infarction, unspecified: Secondary | ICD-10-CM | POA: Diagnosis present

## 2021-01-08 DIAGNOSIS — R278 Other lack of coordination: Secondary | ICD-10-CM | POA: Diagnosis present

## 2021-01-08 DIAGNOSIS — I69351 Hemiplegia and hemiparesis following cerebral infarction affecting right dominant side: Secondary | ICD-10-CM | POA: Diagnosis present

## 2021-01-08 DIAGNOSIS — M6281 Muscle weakness (generalized): Secondary | ICD-10-CM | POA: Diagnosis present

## 2021-01-08 DIAGNOSIS — R531 Weakness: Secondary | ICD-10-CM | POA: Insufficient documentation

## 2021-01-08 DIAGNOSIS — R2681 Unsteadiness on feet: Secondary | ICD-10-CM | POA: Diagnosis present

## 2021-01-08 DIAGNOSIS — R262 Difficulty in walking, not elsewhere classified: Secondary | ICD-10-CM

## 2021-01-08 DIAGNOSIS — R2689 Other abnormalities of gait and mobility: Secondary | ICD-10-CM | POA: Insufficient documentation

## 2021-01-08 NOTE — Therapy (Signed)
[place] MAIN [facility] SERVICES [address] [place], [place], [address] Phone: [phone]   Fax:  [phone]  Physical Therapy Treatment  Patient Details  Name: Curtis Powell MRN: [id number] Date of Birth: [birth year] Referring Provider (PT): Kym Groom MD   Encounter Date: 01/08/2021   PT End of Session - 01/09/21 0711     Visit Number 22    Number of Visits 30    Date for PT Re-Evaluation 01/24/21    Authorization Type UHC Medicare; [place] Medicaid    PT Start Time 7841    PT Stop Time 1429    PT Time Calculation (min) 44 min    Equipment Utilized During Treatment Gait belt    Activity Tolerance Patient tolerated treatment well;Patient limited by fatigue    Behavior During Therapy WFL for tasks assessed/performed             Past Medical History:  Diagnosis Date   GERD (gastroesophageal reflux disease)    Hyperlipidemia    Hypertension    Obesity    Stroke [facility])     Past Surgical History:  Procedure Laterality Date   BRAIN SURGERY      There were no vitals filed for this visit.   Subjective Assessment - 01/08/21 1355     Subjective Patient reports he walked some at home since last session. No falls or LOB since last session.    Pertinent History Patient is a pleasant 55 year old male who presents to physical therapy for weakness and immobility secondary to CVA.  Had a stroke in Feb 2018. He was previously fully independent, but this stroke caused severe residual deficits, mainly on the right side. He reports difficulty walking and decreased mobility. His mom had to move in with him and now is his main caregiver.  Patient had outpatient PT from November 2018-March 2022. Patient reports since being discharged from outpatient therapy he has kept up with his HEP daily. He does report desire for walking better and for getting in and out of bed easier. He reports his mom is helping with some ADLs. He reports needing help with rolling in  bed. He reports his mom/aide assist with dressing. He has a home aide 3 days out of the week;    Limitations Standing;Walking    How long can you sit comfortably? NA    How long can you stand comfortably? <30 min, does minimal standing;    How long can you walk comfortably? depends on the day, fatigue affects his walking; He is walking every day, about 15-20 min;    Diagnostic tests None recent    Patient Stated Goals to walk better and be independent in bed mobility;    Currently in Pain? No/denies                   Treatment:       Ambulate 120 feet today with close CGA. Patient utilized pediatric bariatric RW- Exhibiting short step-through gait pattern with reciprocal steps. VC to maintain his posture and remain close to walker.  Increased external rotation of R foot Decreased LLE step length as he fatigued. Patient reported moderate fatigue at end of walk. One seated rest break required.    Static stand: cross body punches x 30 seconds with multiple near LOB posterior requiring min a for regaining COM.  x2 trials    Static stand chest press with PVC pipe 10x Static stand PVC pipe overhead raise 10x.  STS 10x from w/c with RW; increasing support required with repetition; min/mod A needed by end of session    Seated: GTB hamstring curl 10x each LE    Therapeutic Activities:      Toileting (void in standing) performed 1x at end of session with patient requiring assistance with donning doffing pants, stabilizing self, and washing of hands. Pt held onto grab bars for safety and stabilization.   Stand pivot transfer w/c<>chair. Difficulty with pivot steps, maneuvering RW (PT assisted) and foot clearance. PT directed for safe alignment to sitting surface.   Wheeling into/out of bathroom with focus on lip, requires assistance for entering bathroom, supervision only for exiting.    Pt educated throughout session about proper posture and technique with exercises. Improved  exercise technique, movement at target joints, use of target muscles after min to mod verbal, visual, tactile cues.     Patient tolerated ambulation the session well with no loss of balance and decreased episodes of knee hyperextension/buckling.  Patient strengthening continues to be area of progress/focus due to limited posterior chain strength at this time.  Static stability is improving with increased ability for patient to reach with upper extremities without losing balance posteriorly.Patient will benefit from skilled physical therapy to increase strength, mobility, and stability for decreased fall risk and improved ADL ability.               PT Education - 01/09/21 0711     Education Details ambulation, exercise technique    Person(s) Educated Patient    Methods Explanation;Demonstration;Tactile cues;Verbal cues    Comprehension Verbalized understanding;Verbal cues required;Returned demonstration;Tactile cues required              PT Short Term Goals - 11/29/20 1334       PT SHORT TERM GOAL #1   Title Patient will be adherent to HEP at least 3x a week to improve functional strength and balance for better safety at home.    Baseline 11/15/2020- Patient reports performing seated LE hip march, knee ext, sit to stand and walking daily and states no questions. Advised patient to practice static standing using his walker or kitchen counter several times per day without UE support.    Time 4    Period Weeks    Status Achieved    Target Date 11/01/20      PT SHORT TERM GOAL #2   Title Patient will be independent in bed mobility including sit to supine utilizing bed rails as needed for increased mobility at home.    Baseline 6/29: requires min A 8/24: patient reports needing mod A    Time 4    Period Weeks    Status On-going    Target Date 12/27/20               PT Long Term Goals - 11/29/20 1328       PT LONG TERM GOAL #1   Title Patient will improve FOTO  score to > 50% to indicate improve functional mobility at home and in community.    Baseline 6/29: 42% 8/24: 40%    Time 8    Period Weeks    Status On-going    Target Date 01/24/21      PT LONG TERM GOAL #2   Title Patient (< 23 years old) will complete five times sit to stand test in < 20 seconds indicating an increased LE strength and improved balance.    Baseline 6/29: 23.8 sec with 2HHA. 11/15/2020= 48  sec with BUE Support and CGA using gait belt. 8/24: 23 seconds with 2 HHA    Time 8    Period Weeks    Status Partially Met    Target Date 01/24/21      PT LONG TERM GOAL #3   Title Patient will increase 10 meter walk test to >0.15 m/s (60 sec or less) as to improve gait speed for better home ambulation and to reduce fall risk.    Baseline 6/29: 0.08 m/s with RW. 11/15/2020= 0.093 m/s with RW 8/24: 0.15 m/s    Time 8    Period Weeks    Status Achieved      PT LONG TERM GOAL #4   Title Patient will increase BLE gross strength to 4+/5 as to improve functional strength for independent gait, increased standing tolerance and increased ADL ability.    Baseline 6/29: see flowsheet. 8/24: hips grossly 4-/5 with hip extension 2+/5; L knee flexion 2+/5 knee extension 4/5; R knee 3+/5    Time 8    Period Weeks    Status Partially Met    Target Date 01/24/21      PT LONG TERM GOAL #5   Title Patient will ambulate 250 ft with least assistive device and Supervision with no breaks to allow for increased mobility within home.    Baseline 11/15/2020-Ambulation distance = approx 110 feet using pediatric bariatric walker 8/24: 155 ft with bariatric RW    Time 8    Period Weeks    Status Partially Met    Target Date 01/24/21      Additional Long Term Goals   Additional Long Term Goals Yes      PT LONG TERM GOAL #6   Title Patient will increase 10 meter walk test to >0.5 m/s ( 30 seconds) as to improve gait speed for better home ambulation and to reduce fall risk.    Baseline 8/24: 61 seconds:  0.15 m/s    Time 8    Period Weeks    Status New    Target Date 01/24/21                   Plan - 01/09/21 0713     Clinical Impression Statement Patient tolerated ambulation the session well with no loss of balance and decreased episodes of knee hyperextension/buckling.  Patient strengthening continues to be area of progress/focus due to limited posterior chain strength at this time.  Static stability is improving with increased ability for patient to reach with upper extremities without losing balance posteriorly.Patient will benefit from skilled physical therapy to increase strength, mobility, and stability for decreased fall risk and improved ADL ability.    Personal Factors and Comorbidities Comorbidity 3+;Education;Past/Current Experience;Time since onset of injury/illness/exacerbation;Transportation    Comorbidities HTN, arthritis, past CVA, high fall risk, obesity    Examination-Activity Limitations Bed Mobility;Locomotion Level;Squat;Stairs;Stand;Toileting;Transfers    Examination-Participation Restrictions Community Activity;Driving;Cleaning;Meal Prep;Shop;Volunteer;Yard Work    Stability/Clinical Decision Making Stable/Uncomplicated    Rehab Potential Good    PT Frequency 2x / week    PT Duration 8 weeks    PT Treatment/Interventions ADLs/Self Care Home Management;Cryotherapy;Moist Heat;Gait training;Stair training;Functional mobility training;Therapeutic activities;Therapeutic exercise;Balance training;Neuromuscular re-education;Patient/family education;Orthotic Fit/Training;Passive range of motion;Energy conservation    PT Next Visit Plan Continue with progressive ambulation, transfer techniques, progressive LE strengthening    PT Home Exercise Plan no updates  this session; Reminder to patient to continue with daily walking    Consulted and Agree with Plan of Care  Patient             Patient will benefit from skilled therapeutic intervention in order to improve  the following deficits and impairments:  Abnormal gait, Decreased balance, Decreased endurance, Decreased mobility, Difficulty walking, Hypomobility, Obesity, Improper body mechanics, Decreased activity tolerance, Decreased strength, Postural dysfunction  Visit Diagnosis: Abnormality of gait and mobility  Difficulty in walking, not elsewhere classified  Muscle weakness (generalized)     Problem List Patient Active Problem List   Diagnosis Date Noted   Impaired glucose tolerance 03/23/2020   Learning disability 03/23/2020   Urinary and fecal incontinence 06/14/2019   Chronic pain syndrome 03/23/2019   Constipation 03/23/2019   Dysarthria 03/23/2019   Dysphagia 03/23/2019   Learning difficulty 03/23/2019   Morbid obesity ([place]) 03/23/2019   Muscle weakness 03/23/2019   Vitamin D deficiency 03/23/2019   Bilateral carotid artery stenosis 12/15/2018   Moderate aortic valve stenosis 12/15/2018   BMI 45.0-49.9, adult ([place]) 11/10/2018   Cerebrovascular accident ([place]) 05/02/2016   Hemiparesis affecting right side as late effect of cerebrovascular accident ([place]) 05/01/2016   Brain tumor ([place]) 11/24/2014   Gastro-esophageal reflux disease without esophagitis 11/24/2014   Accident due to mechanical fall without injury 08/03/2014   Essential hypertension 06/13/2014   Mixed hyperlipidemia 06/13/2014   Unspecified sequelae of cerebral infarction 06/13/2014   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 [place], [place], [address] Phone: [phone]   Fax:  [phone]  Name: Curtis Powell MRN: [id number] Date of Birth: 05/18/1965

## 2021-01-10 ENCOUNTER — Other Ambulatory Visit: Payer: Self-pay

## 2021-01-10 ENCOUNTER — Ambulatory Visit: Payer: Medicare Other

## 2021-01-10 DIAGNOSIS — M6281 Muscle weakness (generalized): Secondary | ICD-10-CM

## 2021-01-10 DIAGNOSIS — R269 Unspecified abnormalities of gait and mobility: Secondary | ICD-10-CM | POA: Diagnosis not present

## 2021-01-10 DIAGNOSIS — R2689 Other abnormalities of gait and mobility: Secondary | ICD-10-CM

## 2021-01-10 DIAGNOSIS — R2681 Unsteadiness on feet: Secondary | ICD-10-CM

## 2021-01-10 NOTE — Therapy (Addendum)
[place] MAIN [facility] SERVICES [address] [place], [place], [address] Phone: [phone]   Fax:  [phone]  Physical Therapy Treatment  Patient Details  Name: Curtis Powell MRN: [id number] Date of Birth: 1965/08/28 Referring Provider (PT): Kym Groom MD   Encounter Date: 01/10/2021   PT End of Session - 01/10/21 1300     Visit Number 23    Number of Visits 30    Date for PT Re-Evaluation 01/24/21    Authorization Type UHC Medicare; [place] Medicaid    PT Start Time 1300    PT Stop Time 1344    PT Time Calculation (min) 44 min    Equipment Utilized During Treatment Gait belt    Activity Tolerance Patient tolerated treatment well;Patient limited by fatigue    Behavior During Therapy WFL for tasks assessed/performed             Past Medical History:  Diagnosis Date   GERD (gastroesophageal reflux disease)    Hyperlipidemia    Hypertension    Obesity    Stroke [facility])     Past Surgical History:  Procedure Laterality Date   BRAIN SURGERY      There were no vitals filed for this visit.   Subjective Assessment - 01/10/21 1259     Subjective Patient reports "I'm doing good"; no falls or LOB since last session.    Pertinent History Patient is a pleasant 55 year old male who presents to physical therapy for weakness and immobility secondary to CVA.  Had a stroke in Feb 2018. He was previously fully independent, but this stroke caused severe residual deficits, mainly on the right side. He reports difficulty walking and decreased mobility. His mom had to move in with him and now is his main caregiver.  Patient had outpatient PT from November 2018-March 2022. Patient reports since being discharged from outpatient therapy he has kept up with his HEP daily. He does report desire for walking better and for getting in and out of bed easier. He reports his mom is helping with some ADLs. He reports needing help with rolling in bed. He reports his  mom/aide assist with dressing. He has a home aide 3 days out of the week;    Limitations Standing;Walking    How long can you sit comfortably? NA    How long can you stand comfortably? <30 min, does minimal standing;    How long can you walk comfortably? depends on the day, fatigue affects his walking; He is walking every day, about 15-20 min;    Diagnostic tests None recent    Patient Stated Goals to walk better and be independent in bed mobility;    Currently in Pain? No/denies    Pain Score 0-No pain              Treatment:  Therapeutic Exercise:  Gait training x160 feet: Reciprocal step through gait pattern, moderate UE reliance on RW, decreased hip/knee flexion bilaterally, lateral lean for foot clearance note. Tactile cuing provided at glutes for weight shift and hip extensor activation.  3lb ankle weights with RW (bariatric/pediatric) Standing marches x10 bilaterally Standing step forwards/backwards x10 bilaterally. Foot clearance more challenging on RLE. Seated LAQs, holding 3 seconds, x10 bilaterally Seated lateral step outs/hip abduction, x10 bilaterally.  Parallel Bars: Side stepping x2 lengths of bars. RLE hyperextension noted as fatigue increased.  Seated Balloon Toss x 2 min. Reaching inside/outside BOS for core stabilization, stabilization against pertubation's, and reaction timing.  Therapeutic Activities:  Toileting (void in standing) performed 1x at end of session with patient requiring assistance with donning doffing pants, stabilizing self, and washing of hands. Pt held onto grab bars for safety and stabilization.  Stand pivot transfer w/c<>chair. Difficulty with pivot steps, maneuvering RW (PT assisted) and foot clearance. PT directed for safe alignment to sitting surface.   Wheeling into/out of bathroom with focus on lip, requires assistance for entering bathroom, supervision only for exiting.   Pt educated throughout session about proper posture and  technique with exercises. Improved exercise technique, movement at target joints, use of target muscles after min to mod verbal, visual, tactile cues.      PT Education - 01/10/21 1342     Education Details ambulation, exercise technique,    Person(s) Educated Patient    Methods Explanation;Tactile cues;Verbal cues    Comprehension Verbal cues required;Tactile cues required;Returned demonstration              PT Short Term Goals - 11/29/20 1334       PT SHORT TERM GOAL #1   Title Patient will be adherent to HEP at least 3x a week to improve functional strength and balance for better safety at home.    Baseline 11/15/2020- Patient reports performing seated LE hip march, knee ext, sit to stand and walking daily and states no questions. Advised patient to practice static standing using his walker or kitchen counter several times per day without UE support.    Time 4    Period Weeks    Status Achieved    Target Date 11/01/20      PT SHORT TERM GOAL #2   Title Patient will be independent in bed mobility including sit to supine utilizing bed rails as needed for increased mobility at home.    Baseline 6/29: requires min A 8/24: patient reports needing mod A    Time 4    Period Weeks    Status On-going    Target Date 12/27/20               PT Long Term Goals - 11/29/20 1328       PT LONG TERM GOAL #1   Title Patient will improve FOTO score to > 50% to indicate improve functional mobility at home and in community.    Baseline 6/29: 42% 8/24: 40%    Time 8    Period Weeks    Status On-going    Target Date 01/24/21      PT LONG TERM GOAL #2   Title Patient (< 51 years old) will complete five times sit to stand test in < 20 seconds indicating an increased LE strength and improved balance.    Baseline 6/29: 23.8 sec with 2HHA. 11/15/2020= 48 sec with BUE Support and CGA using gait belt. 8/24: 23 seconds with 2 HHA    Time 8    Period Weeks    Status Partially Met     Target Date 01/24/21      PT LONG TERM GOAL #3   Title Patient will increase 10 meter walk test to >0.15 m/s (60 sec or less) as to improve gait speed for better home ambulation and to reduce fall risk.    Baseline 6/29: 0.08 m/s with RW. 11/15/2020= 0.093 m/s with RW 8/24: 0.15 m/s    Time 8    Period Weeks    Status Achieved      PT LONG TERM GOAL #4   Title Patient will  increase BLE gross strength to 4+/5 as to improve functional strength for independent gait, increased standing tolerance and increased ADL ability.    Baseline 6/29: see flowsheet. 8/24: hips grossly 4-/5 with hip extension 2+/5; L knee flexion 2+/5 knee extension 4/5; R knee 3+/5    Time 8    Period Weeks    Status Partially Met    Target Date 01/24/21      PT LONG TERM GOAL #5   Title Patient will ambulate 250 ft with least assistive device and Supervision with no breaks to allow for increased mobility within home.    Baseline 11/15/2020-Ambulation distance = approx 110 feet using pediatric bariatric walker 8/24: 155 ft with bariatric RW    Time 8    Period Weeks    Status Partially Met    Target Date 01/24/21      Additional Long Term Goals   Additional Long Term Goals Yes      PT LONG TERM GOAL #6   Title Patient will increase 10 meter walk test to >0.5 m/s ( 30 seconds) as to improve gait speed for better home ambulation and to reduce fall risk.    Baseline 8/24: 61 seconds: 0.15 m/s    Time 8    Period Weeks    Status New    Target Date 01/24/21                   Plan - 01/10/21 1351     Clinical Impression Statement Patient tolerated skilled PT well this session with no LOB noted during ambulation. Continue to note right knee hyperextension as fatigue at patient fatigues during ambulation, however patient endurance continues to improve. LE and core strengthening continue to be areas of fous to improve posture and gait mechanics. Patient will continue to benefit from skilled physical therapy to  improve strength, mobility, and stability to improve ADL ability and decrease falls risk.    Personal Factors and Comorbidities Comorbidity 3+;Education;Past/Current Experience;Time since onset of injury/illness/exacerbation;Transportation    Comorbidities HTN, arthritis, past CVA, high fall risk, obesity    Examination-Activity Limitations Bed Mobility;Locomotion Level;Squat;Stairs;Stand;Toileting;Transfers    Examination-Participation Restrictions Community Activity;Driving;Cleaning;Meal Prep;Shop;Volunteer;Yard Work    Stability/Clinical Decision Making Stable/Uncomplicated    Rehab Potential Good    PT Frequency 2x / week    PT Duration 8 weeks    PT Treatment/Interventions ADLs/Self Care Home Management;Cryotherapy;Moist Heat;Gait training;Stair training;Functional mobility training;Therapeutic activities;Therapeutic exercise;Balance training;Neuromuscular re-education;Patient/family education;Orthotic Fit/Training;Passive range of motion;Energy conservation    PT Next Visit Plan Continue with progressive ambulation, transfer techniques, progressive LE strengthening    PT Home Exercise Plan no updates  this session; Reminder to patient to continue with daily walking    Consulted and Agree with Plan of Care Patient             Patient will benefit from skilled therapeutic intervention in order to improve the following deficits and impairments:  Abnormal gait, Decreased balance, Decreased endurance, Decreased mobility, Difficulty walking, Hypomobility, Obesity, Improper body mechanics, Decreased activity tolerance, Decreased strength, Postural dysfunction  Visit Diagnosis: Muscle weakness (generalized)  Other abnormalities of gait and mobility  Unsteadiness on feet     Problem List Patient Active Problem List   Diagnosis Date Noted   Impaired glucose tolerance 03/23/2020   Learning disability 03/23/2020   Urinary and fecal incontinence 06/14/2019   Chronic pain syndrome  03/23/2019   Constipation 03/23/2019   Dysarthria 03/23/2019   Dysphagia 03/23/2019   Learning difficulty 03/23/2019  Morbid obesity ([place]) 03/23/2019   Muscle weakness 03/23/2019   Vitamin D deficiency 03/23/2019   Bilateral carotid artery stenosis 12/15/2018   Moderate aortic valve stenosis 12/15/2018   BMI 45.0-49.9, adult ([place]) 11/10/2018   Cerebrovascular accident ([place]) 05/02/2016   Hemiparesis affecting right side as late effect of cerebrovascular accident ([place]) 05/01/2016   Brain tumor ([place]) 11/24/2014   Gastro-esophageal reflux disease without esophagitis 11/24/2014   Accident due to mechanical fall without injury 08/03/2014   Essential hypertension 06/13/2014   Mixed hyperlipidemia 06/13/2014   Unspecified sequelae of cerebral infarction 06/13/2014   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/2014   Type 2 diabetes mellitus without complication ([address]) 33/17/4099   Neck mass 09/08/2013   Swimmer's ear 09/08/2013   Erectile dysfunction 01/03/2013   Prediabetes 01/03/2013   Arsenio Katz, SPT  This entire session was performed under direct supervision and direction of a licensed therapist/therapist assistant . I have personally read, edited and approve of the note as written.  Janna Arch, PT, DPT  01/10/2021, 2:09 PM  [place] MAIN [facility] SERVICES [address] [place], [place], [address] Phone: [phone]   Fax:  [phone]  Name: BODEN STUCKY MRN: [id number] Date of Birth: 1965/04/24

## 2021-01-15 ENCOUNTER — Ambulatory Visit: Payer: Medicare Other | Admitting: Physical Therapy

## 2021-01-15 ENCOUNTER — Other Ambulatory Visit: Payer: Self-pay

## 2021-01-15 DIAGNOSIS — R2689 Other abnormalities of gait and mobility: Secondary | ICD-10-CM

## 2021-01-15 DIAGNOSIS — R269 Unspecified abnormalities of gait and mobility: Secondary | ICD-10-CM | POA: Diagnosis not present

## 2021-01-15 DIAGNOSIS — R262 Difficulty in walking, not elsewhere classified: Secondary | ICD-10-CM

## 2021-01-15 DIAGNOSIS — R531 Weakness: Secondary | ICD-10-CM

## 2021-01-15 DIAGNOSIS — R278 Other lack of coordination: Secondary | ICD-10-CM

## 2021-01-15 DIAGNOSIS — M6281 Muscle weakness (generalized): Secondary | ICD-10-CM

## 2021-01-15 DIAGNOSIS — R2681 Unsteadiness on feet: Secondary | ICD-10-CM

## 2021-01-15 DIAGNOSIS — I639 Cerebral infarction, unspecified: Secondary | ICD-10-CM

## 2021-01-15 DIAGNOSIS — I69351 Hemiplegia and hemiparesis following cerebral infarction affecting right dominant side: Secondary | ICD-10-CM

## 2021-01-15 NOTE — Therapy (Signed)
[place] MAIN [facility] SERVICES [address] [place], [place], [address] Phone: [phone]   Fax:  [phone]  Physical Therapy Treatment  Patient Details  Name: Curtis Powell MRN: [id number] Date of Birth: [birth year] Referring Provider (PT): Kym Groom MD   Encounter Date: 01/15/2021   PT End of Session - 01/15/21 1750     Visit Number 24    Number of Visits 30    Date for PT Re-Evaluation 01/24/21    Authorization Type UHC Medicare; [place] Medicaid    PT Start Time 8882    PT Stop Time 1432    PT Time Calculation (min) 44 min    Equipment Utilized During Treatment Gait belt    Activity Tolerance Patient tolerated treatment well;Patient limited by fatigue    Behavior During Therapy WFL for tasks assessed/performed             Past Medical History:  Diagnosis Date   GERD (gastroesophageal reflux disease)    Hyperlipidemia    Hypertension    Obesity    Stroke [facility])     Past Surgical History:  Procedure Laterality Date   BRAIN SURGERY      There were no vitals filed for this visit.   Subjective Assessment - 01/15/21 1749     Subjective Patient reports he is doing well today. No falls or LOB since last session. He does state he has been up walking at home since last session.    Pertinent History Patient is a pleasant 55 year old male who presents to physical therapy for weakness and immobility secondary to CVA.  Had a stroke in Feb 2018. He was previously fully independent, but this stroke caused severe residual deficits, mainly on the right side. He reports difficulty walking and decreased mobility. His mom had to move in with him and now is his main caregiver.  Patient had outpatient PT from November 2018-March 2022. Patient reports since being discharged from outpatient therapy he has kept up with his HEP daily. He does report desire for walking better and for getting in and out of bed easier. He reports his mom is helping with some  ADLs. He reports needing help with rolling in bed. He reports his mom/aide assist with dressing. He has a home aide 3 days out of the week;    Limitations Standing;Walking    How long can you sit comfortably? NA    How long can you stand comfortably? <30 min, does minimal standing;    How long can you walk comfortably? depends on the day, fatigue affects his walking; He is walking every day, about 15-20 min;    Diagnostic tests None recent    Patient Stated Goals to walk better and be independent in bed mobility;    Currently in Pain? No/denies               Treatment:   Therapeutic Exercise:   Gait training x160 feet: Reciprocal step-through gait pattern, moderate UE reliance on RW, decreased hip/knee flexion bilaterally, lateral lean/weight shift for foot clearance noted. Increased difficulty clearing RLE compared to LLE. Tactile cuing provided at glutes for weight shift and hip extensor activation.   3lb ankle weights in // bars: Standing marches x10 bilaterally Standing step forwards/backwards x10 bilaterally. Foot clearance more challenging on RLE. Seated LAQs, holding 3 seconds, x10 bilaterally Seated lateral step outs/hip abduction, x10 bilaterally - visual/tactile cueing for RLE.   Parallel Bars: Side stepping x1 length of bars. RLE  hyperextension noted as fatigue increased resulting in increased difficulty clearing foot.     Therapeutic Activities:   Stand pivot transfer w/c<>chair. Difficulty with pivot steps, maneuvering RW (PT assisted) and foot clearance. PT directed for safe alignment to sitting surface.    Pt educated throughout session about proper posture and technique with exercises. Improved exercise technique, movement at target joints, use of target muscles after min to mod verbal, visual, tactile cues.   Pt demo excellent motivation and is agreeable throughout session. Pt asked to begin with ambulation and follow with exercises. Gait velocity continues  to be significantly decreased with difficulty clearing BLE, R>L. Pt was challenged to increase cadence for 41f in hallway resulting in improved stride length and B foot clearance (R toe dragging once), however also increased fatigue. Pt demo decreased safety awareness and education retention during transfers as PT must guide pt for safe alignment to sitting surface on 3 occasions, safety education provided each time. Patient will continue to benefit from skilled physical therapy to improve strength, mobility, and stability to improve ADL ability and decrease falls risk.           PT Short Term Goals - 11/29/20 1334       PT SHORT TERM GOAL #1   Title Patient will be adherent to HEP at least 3x a week to improve functional strength and balance for better safety at home.    Baseline 11/15/2020- Patient reports performing seated LE hip march, knee ext, sit to stand and walking daily and states no questions. Advised patient to practice static standing using his walker or kitchen counter several times per day without UE support.    Time 4    Period Weeks    Status Achieved    Target Date 11/01/20      PT SHORT TERM GOAL #2   Title Patient will be independent in bed mobility including sit to supine utilizing bed rails as needed for increased mobility at home.    Baseline 6/29: requires min A 8/24: patient reports needing mod A    Time 4    Period Weeks    Status On-going    Target Date 12/27/20               PT Long Term Goals - 11/29/20 1328       PT LONG TERM GOAL #1   Title Patient will improve FOTO score to > 50% to indicate improve functional mobility at home and in community.    Baseline 6/29: 42% 8/24: 40%    Time 8    Period Weeks    Status On-going    Target Date 01/24/21      PT LONG TERM GOAL #2   Title Patient (< 640years old) will complete five times sit to stand test in < 20 seconds indicating an increased LE strength and improved balance.    Baseline 6/29:  23.8 sec with 2HHA. 11/15/2020= 48 sec with BUE Support and CGA using gait belt. 8/24: 23 seconds with 2 HHA    Time 8    Period Weeks    Status Partially Met    Target Date 01/24/21      PT LONG TERM GOAL #3   Title Patient will increase 10 meter walk test to >0.15 m/s (60 sec or less) as to improve gait speed for better home ambulation and to reduce fall risk.    Baseline 6/29: 0.08 m/s with RW. 11/15/2020= 0.093 m/s with RW 8/24: 0.15  m/s    Time 8    Period Weeks    Status Achieved      PT LONG TERM GOAL #4   Title Patient will increase BLE gross strength to 4+/5 as to improve functional strength for independent gait, increased standing tolerance and increased ADL ability.    Baseline 6/29: see flowsheet. 8/24: hips grossly 4-/5 with hip extension 2+/5; L knee flexion 2+/5 knee extension 4/5; R knee 3+/5    Time 8    Period Weeks    Status Partially Met    Target Date 01/24/21      PT LONG TERM GOAL #5   Title Patient will ambulate 250 ft with least assistive device and Supervision with no breaks to allow for increased mobility within home.    Baseline 11/15/2020-Ambulation distance = approx 110 feet using pediatric bariatric walker 8/24: 155 ft with bariatric RW    Time 8    Period Weeks    Status Partially Met    Target Date 01/24/21      Additional Long Term Goals   Additional Long Term Goals Yes      PT LONG TERM GOAL #6   Title Patient will increase 10 meter walk test to >0.5 m/s ( 30 seconds) as to improve gait speed for better home ambulation and to reduce fall risk.    Baseline 8/24: 61 seconds: 0.15 m/s    Time 8    Period Weeks    Status New    Target Date 01/24/21                   Plan - 01/15/21 1750     Clinical Impression Statement Pt demo excellent motivation and is agreeable throughout session. Pt asked to begin with ambulation and follow with exercises. Gait velocity continues to be significantly decreased with difficulty clearing BLE, R>L. Pt  was challenged to increase cadence for 50f in hallway resulting in improved stride length and B foot clearance (R toe dragging once), however also increased fatigue. Pt demo decreased safety awareness and education retention during transfers as PT must guide pt for safe alignment to sitting surface on 3 occasions, safety education provided each time. Patient will continue to benefit from skilled physical therapy to improve strength, mobility, and stability to improve ADL ability and decrease falls risk.    Personal Factors and Comorbidities Comorbidity 3+;Education;Past/Current Experience;Time since onset of injury/illness/exacerbation;Transportation    Comorbidities HTN, arthritis, past CVA, high fall risk, obesity    Examination-Activity Limitations Bed Mobility;Locomotion Level;Squat;Stairs;Stand;Toileting;Transfers    Examination-Participation Restrictions Community Activity;Driving;Cleaning;Meal Prep;Shop;Volunteer;Yard Work    Stability/Clinical Decision Making Stable/Uncomplicated    Rehab Potential Good    PT Frequency 2x / week    PT Duration 8 weeks    PT Treatment/Interventions ADLs/Self Care Home Management;Cryotherapy;Moist Heat;Gait training;Stair training;Functional mobility training;Therapeutic activities;Therapeutic exercise;Balance training;Neuromuscular re-education;Patient/family education;Orthotic Fit/Training;Passive range of motion;Energy conservation    PT Next Visit Plan Continue with progressive ambulation, transfer techniques, progressive LE strengthening    PT Home Exercise Plan no updates  this session; Reminder to patient to continue with daily walking    Consulted and Agree with Plan of Care Patient             Patient will benefit from skilled therapeutic intervention in order to improve the following deficits and impairments:  Abnormal gait, Decreased balance, Decreased endurance, Decreased mobility, Difficulty walking, Hypomobility, Obesity, Improper body  mechanics, Decreased activity tolerance, Decreased strength, Postural dysfunction  Visit Diagnosis: Muscle weakness (  generalized)  Abnormality of gait and mobility  Left-sided weakness  Other abnormalities of gait and mobility  Difficulty in walking, not elsewhere classified  Hemiplegia and hemiparesis following cerebral infarction affecting right dominant side (HCC)  Unsteadiness on feet  Other lack of coordination  Acute CVA (cerebrovascular accident) [facility])     Problem List Patient Active Problem List   Diagnosis Date Noted   Impaired glucose tolerance 03/23/2020   Learning disability 03/23/2020   Urinary and fecal incontinence 06/14/2019   Chronic pain syndrome 03/23/2019   Constipation 03/23/2019   Dysarthria 03/23/2019   Dysphagia 03/23/2019   Learning difficulty 03/23/2019   Morbid obesity ([place]) 03/23/2019   Muscle weakness 03/23/2019   Vitamin D deficiency 03/23/2019   Bilateral carotid artery stenosis 12/15/2018   Moderate aortic valve stenosis 12/15/2018   BMI 45.0-49.9, adult ([place]) 11/10/2018   Cerebrovascular accident ([place]) 05/02/2016   Hemiparesis affecting right side as late effect of cerebrovascular accident ([place]) 05/01/2016   Brain tumor ([place]) 11/24/2014   Gastro-esophageal reflux disease without esophagitis 11/24/2014   Accident due to mechanical fall without injury 08/03/2014   Essential hypertension 06/13/2014   Mixed hyperlipidemia 06/13/2014   Unspecified sequelae of cerebral infarction 06/13/2014   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/2014   Type 2 diabetes mellitus without complication ([place]) 67/89/3810   Neck mass 09/08/2013   Swimmer's ear 09/08/2013   Erectile dysfunction 01/03/2013   Prediabetes 01/03/2013    Patrina Levering PT, DPT  Ramonita Lab, PT 01/15/2021, 5:52 PM  [place] MAIN [facility] SERVICES [address] [place], [place], [address] Phone: [phone]   Fax:  [phone]  Name: LUPE BONNER MRN:  [id number] Date of Birth: 1965-08-04

## 2021-01-17 ENCOUNTER — Ambulatory Visit: Payer: Medicare Other

## 2021-01-17 ENCOUNTER — Other Ambulatory Visit: Payer: Self-pay

## 2021-01-17 DIAGNOSIS — R269 Unspecified abnormalities of gait and mobility: Secondary | ICD-10-CM

## 2021-01-17 DIAGNOSIS — R262 Difficulty in walking, not elsewhere classified: Secondary | ICD-10-CM

## 2021-01-17 DIAGNOSIS — M6281 Muscle weakness (generalized): Secondary | ICD-10-CM

## 2021-01-17 NOTE — Therapy (Signed)
[place] MAIN [facility] SERVICES [address] [place], [place], [address] Phone: [phone]   Fax:  [phone]  Physical Therapy Treatment  Patient Details  Name: Curtis Powell MRN: [id number] Date of Birth: 21-Sep-1965 Referring Provider (PT): Kym Groom MD   Encounter Date: 01/17/2021   PT End of Session - 01/17/21 1253     Visit Number 25    Number of Visits 30    Date for PT Re-Evaluation 01/24/21    Authorization Type UHC Medicare; [place] Medicaid    PT Start Time 1300    PT Stop Time 1344    PT Time Calculation (min) 44 min    Equipment Utilized During Treatment Gait belt    Activity Tolerance Patient tolerated treatment well;Patient limited by fatigue    Behavior During Therapy WFL for tasks assessed/performed             Past Medical History:  Diagnosis Date   GERD (gastroesophageal reflux disease)    Hyperlipidemia    Hypertension    Obesity    Stroke [facility])     Past Surgical History:  Procedure Laterality Date   BRAIN SURGERY      There were no vitals filed for this visit.   Subjective Assessment - 01/17/21 1250     Subjective Patient reports he is doing well today. No falls since last session. Patient reports walking down the hallway at home from kitchen to back door with no LOB.    Pertinent History Patient is a pleasant 55 year old male who presents to physical therapy for weakness and immobility secondary to CVA.  Had a stroke in Feb 2018. He was previously fully independent, but this stroke caused severe residual deficits, mainly on the right side. He reports difficulty walking and decreased mobility. His mom had to move in with him and now is his main caregiver.  Patient had outpatient PT from November 2018-March 2022. Patient reports since being discharged from outpatient therapy he has kept up with his HEP daily. He does report desire for walking better and for getting in and out of bed easier. He reports his mom is  helping with some ADLs. He reports needing help with rolling in bed. He reports his mom/aide assist with dressing. He has a home aide 3 days out of the week;    Limitations Standing;Walking    How long can you sit comfortably? NA    How long can you stand comfortably? <30 min, does minimal standing;    How long can you walk comfortably? depends on the day, fatigue affects his walking; He is walking every day, about 15-20 min;    Diagnostic tests None recent    Patient Stated Goals to walk better and be independent in bed mobility;    Currently in Pain? No/denies    Pain Score 0-No pain              Treatment:  Therapeutic Ex: Seated GTB Hamstring curls, 10x each side STS x10, cuing for standing tall and powering up.  Gait training x160 feet. Tactile cuing for hip extension and glute activation during stance to promote foot clearance and weight shift. Patient with improved gait pattern compared to previous sessions; toe drag and weight shift only challenged towards end of both gait trials (2x 80 feet).  Neuro Re-Ed:  Standing at RW (pediatric/bariatric): - alternating hi-lo punches with PT changing hand positions for pt to aim for to challenge balance while reaching outside BOS,  2 trials: 40 sec and 60 sec. Patient with increased posterior lean around 40 sec mark when  fatigue increased. - marches w/ 3lb ankle weights, 8x each side. - hip abduction/ toe taps to hedgehogs and 3lb ankle weights, alternating sides, 8x each side, 2 sets (first set with ankle weights, second set without). More challenging abducting RLE and patient with compensatory lateral lean on RW when abducting RLE as fatigue increased. Very challenging for patient.   Therapeutic Activities:   Toileting (void in standing) performed 1x at end of session with patient requiring assistance with donning doffing pants, stabilizing self, and washing of hands. Pt held onto grab bars for safety and stabilization.  Stand  pivot transfer w/c<>chair. Difficulty with pivot steps, maneuvering RW (PT assisted) and foot clearance. PT directed for safe alignment to sitting surface.   Wheeling into/out of bathroom with focus on lip, requires assistance for entering bathroom, supervision only for exiting.    Pt educated throughout session about proper posture and technique with exercises. Improved exercise technique, movement at target joints, use of target muscles after min to mod verbal, visual, tactile cues            PT Education - 01/17/21 1252     Education Details ambulation, exercise technique    Person(s) Educated Patient    Methods Explanation;Tactile cues;Verbal cues    Comprehension Returned demonstration;Verbal cues required;Tactile cues required              PT Short Term Goals - 11/29/20 1334       PT SHORT TERM GOAL #1   Title Patient will be adherent to HEP at least 3x a week to improve functional strength and balance for better safety at home.    Baseline 11/15/2020- Patient reports performing seated LE hip march, knee ext, sit to stand and walking daily and states no questions. Advised patient to practice static standing using his walker or kitchen counter several times per day without UE support.    Time 4    Period Weeks    Status Achieved    Target Date 11/01/20      PT SHORT TERM GOAL #2   Title Patient will be independent in bed mobility including sit to supine utilizing bed rails as needed for increased mobility at home.    Baseline 6/29: requires min A 8/24: patient reports needing mod A    Time 4    Period Weeks    Status On-going    Target Date 12/27/20               PT Long Term Goals - 11/29/20 1328       PT LONG TERM GOAL #1   Title Patient will improve FOTO score to > 50% to indicate improve functional mobility at home and in community.    Baseline 6/29: 42% 8/24: 40%    Time 8    Period Weeks    Status On-going    Target Date 01/24/21      PT LONG  TERM GOAL #2   Title Patient (< 56 years old) will complete five times sit to stand test in < 20 seconds indicating an increased LE strength and improved balance.    Baseline 6/29: 23.8 sec with 2HHA. 11/15/2020= 48 sec with BUE Support and CGA using gait belt. 8/24: 23 seconds with 2 HHA    Time 8    Period Weeks    Status Partially Met    Target Date 01/24/21  PT LONG TERM GOAL #3   Title Patient will increase 10 meter walk test to >0.15 m/s (60 sec or less) as to improve gait speed for better home ambulation and to reduce fall risk.    Baseline 6/29: 0.08 m/s with RW. 11/15/2020= 0.093 m/s with RW 8/24: 0.15 m/s    Time 8    Period Weeks    Status Achieved      PT LONG TERM GOAL #4   Title Patient will increase BLE gross strength to 4+/5 as to improve functional strength for independent gait, increased standing tolerance and increased ADL ability.    Baseline 6/29: see flowsheet. 8/24: hips grossly 4-/5 with hip extension 2+/5; L knee flexion 2+/5 knee extension 4/5; R knee 3+/5    Time 8    Period Weeks    Status Partially Met    Target Date 01/24/21      PT LONG TERM GOAL #5   Title Patient will ambulate 250 ft with least assistive device and Supervision with no breaks to allow for increased mobility within home.    Baseline 11/15/2020-Ambulation distance = approx 110 feet using pediatric bariatric walker 8/24: 155 ft with bariatric RW    Time 8    Period Weeks    Status Partially Met    Target Date 01/24/21      Additional Long Term Goals   Additional Long Term Goals Yes      PT LONG TERM GOAL #6   Title Patient will increase 10 meter walk test to >0.5 m/s ( 30 seconds) as to improve gait speed for better home ambulation and to reduce fall risk.    Baseline 8/24: 61 seconds: 0.15 m/s    Time 8    Period Weeks    Status New    Target Date 01/24/21                   Plan - 01/17/21 1255     Clinical Impression Statement Patient with good effort in skilled  PT today. Patient demonstrated improved foot clearance and weight shift during ambulation, with tactile cuing only needed towards end of each trial as fatigue increased. Patient gave good effort during standing dynamic balance tasks and hip abduction exercises, but significant compensatory strategies were displayed as fatigued. Patient will continue to benefit from skilled PT to improve strength, funcitonal mobility, balance, and independence with ADLs.    Personal Factors and Comorbidities Comorbidity 3+;Education;Past/Current Experience;Time since onset of injury/illness/exacerbation;Transportation    Comorbidities HTN, arthritis, past CVA, high fall risk, obesity    Examination-Activity Limitations Bed Mobility;Locomotion Level;Squat;Stairs;Stand;Toileting;Transfers    Examination-Participation Restrictions Community Activity;Driving;Cleaning;Meal Prep;Shop;Volunteer;Yard Work    Stability/Clinical Decision Making Stable/Uncomplicated    Rehab Potential Good    PT Frequency 2x / week    PT Duration 8 weeks    PT Treatment/Interventions ADLs/Self Care Home Management;Cryotherapy;Moist Heat;Gait training;Stair training;Functional mobility training;Therapeutic activities;Therapeutic exercise;Balance training;Neuromuscular re-education;Patient/family education;Orthotic Fit/Training;Passive range of motion;Energy conservation    PT Next Visit Plan Continue with progressive ambulation, transfer techniques, progressive LE strengthening    PT Home Exercise Plan no updates  this session; Reminder to patient to continue with daily walking    Consulted and Agree with Plan of Care Patient             Patient will benefit from skilled therapeutic intervention in order to improve the following deficits and impairments:  Abnormal gait, Decreased balance, Decreased endurance, Decreased mobility, Difficulty walking, Hypomobility, Obesity, Improper body mechanics, Decreased  activity tolerance, Decreased  strength, Postural dysfunction  Visit Diagnosis: Abnormality of gait and mobility  Difficulty in walking, not elsewhere classified  Muscle weakness (generalized)     Problem List Patient Active Problem List   Diagnosis Date Noted   Impaired glucose tolerance 03/23/2020   Learning disability 03/23/2020   Urinary and fecal incontinence 06/14/2019   Chronic pain syndrome 03/23/2019   Constipation 03/23/2019   Dysarthria 03/23/2019   Dysphagia 03/23/2019   Learning difficulty 03/23/2019   Morbid obesity ([place]) 03/23/2019   Muscle weakness 03/23/2019   Vitamin D deficiency 03/23/2019   Bilateral carotid artery stenosis 12/15/2018   Moderate aortic valve stenosis 12/15/2018   BMI 45.0-49.9, adult ([place]) 11/10/2018   Cerebrovascular accident ([place]) 05/02/2016   Hemiparesis affecting right side as late effect of cerebrovascular accident ([place]) 05/01/2016   Brain tumor ([place]) 11/24/2014   Gastro-esophageal reflux disease without esophagitis 11/24/2014   Accident due to mechanical fall without injury 08/03/2014   Essential hypertension 06/13/2014   Mixed hyperlipidemia 06/13/2014   Unspecified sequelae of cerebral infarction 06/13/2014   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/2014   Type 2 diabetes mellitus without complication ([place]) 65/53/7482   Neck mass 09/08/2013   Swimmer's ear 09/08/2013   Erectile dysfunction 01/03/2013   Prediabetes 01/03/2013   Arsenio Katz, SPT   This entire session was performed under direct supervision and direction of a licensed therapist/therapist assistant . I have personally read, edited and approve of the note as written.  Janna Arch, PT, DPT  01/17/2021, 2:06 PM  [place] MAIN [facility] SERVICES [address] [place], [place], [address] Phone: [phone]   Fax:  [phone]  Name: KAUSHAL VANNICE MRN: [id number] Date of Birth: 27-Dec-1965

## 2021-01-22 ENCOUNTER — Ambulatory Visit: Payer: Medicare Other

## 2021-01-22 ENCOUNTER — Other Ambulatory Visit: Payer: Self-pay

## 2021-01-22 DIAGNOSIS — R269 Unspecified abnormalities of gait and mobility: Secondary | ICD-10-CM | POA: Diagnosis not present

## 2021-01-22 DIAGNOSIS — R2681 Unsteadiness on feet: Secondary | ICD-10-CM

## 2021-01-22 DIAGNOSIS — M6281 Muscle weakness (generalized): Secondary | ICD-10-CM

## 2021-01-22 NOTE — Therapy (Signed)
[place] MAIN [facility] SERVICES [address] [place], [place], [address] Phone: [phone]   Fax:  [phone]  Physical Therapy Treatment/RECERT  Patient Details  Name: Curtis Powell MRN: [id number] Date of Birth: 07-Aug-1965 Referring Provider (PT): Kym Groom MD   Encounter Date: 01/22/2021   PT End of Session - 01/22/21 1405     Visit Number 26    Number of Visits 42    Date for PT Re-Evaluation 03/19/21    Authorization Type UHC Medicare; [place] Medicaid    PT Start Time 1346    PT Stop Time 1429    PT Time Calculation (min) 43 min    Equipment Utilized During Treatment Gait belt    Activity Tolerance Patient tolerated treatment well;Patient limited by fatigue    Behavior During Therapy WFL for tasks assessed/performed             Past Medical History:  Diagnosis Date   GERD (gastroesophageal reflux disease)    Hyperlipidemia    Hypertension    Obesity    Stroke [facility])     Past Surgical History:  Procedure Laterality Date   BRAIN SURGERY      There were no vitals filed for this visit.   Subjective Assessment - 01/22/21 1437     Subjective Patient reports he worked out with his uncle since last session. No falls or LOB since last session. Wants to continue therapy sessions.    Pertinent History Patient is a pleasant 55 year old male who presents to physical therapy for weakness and immobility secondary to CVA.  Had a stroke in Feb 2018. He was previously fully independent, but this stroke caused severe residual deficits, mainly on the right side. He reports difficulty walking and decreased mobility. His mom had to move in with him and now is his main caregiver.  Patient had outpatient PT from November 2018-March 2022. Patient reports since being discharged from outpatient therapy he has kept up with his HEP daily. He does report desire for walking better and for getting in and out of bed easier. He reports his mom is helping with  some ADLs. He reports needing help with rolling in bed. He reports his mom/aide assist with dressing. He has a home aide 3 days out of the week;    Limitations Standing;Walking    How long can you sit comfortably? NA    How long can you stand comfortably? <30 min, does minimal standing;    How long can you walk comfortably? depends on the day, fatigue affects his walking; He is walking every day, about 15-20 min;    Diagnostic tests None recent    Patient Stated Goals to walk better and be independent in bed mobility;    Currently in Pain? No/denies                     Patient will be independent in bed mobility including sit to supine utilizing bed rails as needed for increased mobility at home FOTO: 40%  5x STS : 19.23 seconds with BUE support and ~75% stand on last stand.  BLE strength   Right Left  Hip flexion 4- 3+  Hip Abduction 4- 3+  Hip Adduction 4- 3  Knee Extension  4- 3+  Knee Flexion 4- 3+  DF 4- 3  PF 4- 3    Ambulate 250 ft: 170 ft with bariatric RW 10 MWT : 1 min 4 seconds =0.15 m/s with distraction  Toileting (void in standing) performed 1x at end of session with patient requiring assistance with donning doffing pants, stabilizing self, and washing of hands. Pt held onto grab bars for safety and stabilization.   Stand pivot transfer w/c<>chair. Difficulty with pivot steps, maneuvering RW (PT assisted) and foot clearance. PT directed for safe alignment to sitting surface.   Wheeling into/out of bathroom with focus on lip, requires assistance for entering bathroom, supervision only for exiting.    Pt educated throughout session about proper posture and technique with exercises. Improved exercise technique, movement at target joints, use of target muscles after min to mod verbal, visual, tactile cues    Patient has met goal for 5x STS and goal will be progressed for difficulty with decreased reliance upon Ue's. His velocity of ambulation has near  doubled since evaluation. He continues to be highly motivated and his capacity for ambulation increased since evaluation. Patient continues to have internal rotation of bilateral feet with fatigue with increased episodes of bilateral knee hyperextension. Patient will benefit from additional recert due to progression of mobility. Patient will continue to benefit from skilled PT to improve strength, funcitonal mobility, balance, and independence with ADLs.              PT Education - 01/22/21 1436     Education Details goals, POC, re-cert    Person(s) Educated Patient    Methods Explanation;Demonstration;Tactile cues;Verbal cues    Comprehension Verbalized understanding;Returned demonstration;Verbal cues required;Tactile cues required              PT Short Term Goals - 01/22/21 1436       PT SHORT TERM GOAL #1   Title Patient will be adherent to HEP at least 3x a week to improve functional strength and balance for better safety at home.    Baseline 11/15/2020- Patient reports performing seated LE hip march, knee ext, sit to stand and walking daily and states no questions. Advised patient to practice static standing using his walker or kitchen counter several times per day without UE support.    Time 4    Period Weeks    Status Achieved    Target Date 11/01/20      PT SHORT TERM GOAL #2   Title Patient will be independent in bed mobility including sit to supine utilizing bed rails as needed for increased mobility at home.    Baseline 6/29: requires min A 8/24: patient reports needing mod A    Time 4    Period Weeks    Status On-going    Target Date 02/19/21               PT Long Term Goals - 01/22/21 1431       PT LONG TERM GOAL #1   Title Patient will improve FOTO score to > 50% to indicate improve functional mobility at home and in community.    Baseline 6/29: 42% 8/24: 40% 10/17: 40%    Time 8    Period Weeks    Status On-going    Target Date 03/19/21       PT LONG TERM GOAL #2   Title Patient (< 23 years old) will complete five times sit to stand test in < 20 seconds indicating an increased LE strength and improved balance.    Baseline 6/29: 23.8 sec with 2HHA. 11/15/2020= 48 sec with BUE Support and CGA using gait belt. 8/24: 23 seconds with 2 HHA 10/17: 19.23 seconds with BUE support  Time 8    Period Weeks    Status Achieved      PT LONG TERM GOAL #3   Title Patient will increase 10 meter walk test to >0.15 m/s (60 sec or less) as to improve gait speed for better home ambulation and to reduce fall risk.    Baseline 6/29: 0.08 m/s with RW. 11/15/2020= 0.093 m/s with RW 8/24: 0.15 m/s    Time 8    Period Weeks    Status Achieved      PT LONG TERM GOAL #4   Title Patient will increase BLE gross strength to 4+/5 as to improve functional strength for independent gait, increased standing tolerance and increased ADL ability.    Baseline 6/29: see flowsheet. 8/24: hips grossly 4-/5 with hip extension 2+/5; L knee flexion 2+/5 knee extension 4/5; R knee 3+/5 10/17: see note    Time 8    Period Weeks    Status Partially Met    Target Date 03/19/21      PT LONG TERM GOAL #5   Title Patient will ambulate 250 ft with least assistive device and Supervision with no breaks to allow for increased mobility within home.    Baseline 11/15/2020-Ambulation distance = approx 110 feet using pediatric bariatric walker 8/24: 155 ft with bariatric RW 10/17: 170 ft with bariatric RW    Time 8    Period Weeks    Status Partially Met    Target Date 03/19/21      Additional Long Term Goals   Additional Long Term Goals Yes      PT LONG TERM GOAL #6   Title Patient will increase 10 meter walk test to >0.5 m/s ( 30 seconds) as to improve gait speed for better home ambulation and to reduce fall risk.    Baseline 8/24: 61 seconds: 0.15 m/s 10/17: 64 seconds ~0.15 m/s    Time 8    Period Weeks    Status Partially Met    Target Date 03/19/21      PT LONG TERM  GOAL #7   Title Patient (< 49 years old) will complete five times sit to stand test in < 20 seconds with SUE support  indicating an increased LE strength and improved balance.    Baseline 10/17: 19.23 seconds with BUE support    Time 8    Period Weeks    Status New    Target Date 03/19/21                   Plan - 01/22/21 1445     Clinical Impression Statement Patient has met goal for 5x STS and goal will be progressed for difficulty with decreased reliance upon Ue's. His velocity of ambulation has near doubled since evaluation. He continues to be highly motivated and his capacity for ambulation increased since evaluation. Patient continues to have internal rotation of bilateral feet with fatigue with increased episodes of bilateral knee hyperextension. Patient will benefit from additional recert due to progression of mobility. Patient will continue to benefit from skilled PT to improve strength, funcitonal mobility, balance, and independence with ADLs.    Personal Factors and Comorbidities Comorbidity 3+;Education;Past/Current Experience;Time since onset of injury/illness/exacerbation;Transportation    Comorbidities HTN, arthritis, past CVA, high fall risk, obesity    Examination-Activity Limitations Bed Mobility;Locomotion Level;Squat;Stairs;Stand;Toileting;Transfers    Examination-Participation Restrictions Community Activity;Driving;Cleaning;Meal Prep;Shop;Volunteer;Yard Work    Stability/Clinical Decision Making Stable/Uncomplicated    Rehab Potential Good    PT Frequency 2x /  week    PT Duration 8 weeks    PT Treatment/Interventions ADLs/Self Care Home Management;Cryotherapy;Moist Heat;Gait training;Stair training;Functional mobility training;Therapeutic activities;Therapeutic exercise;Balance training;Neuromuscular re-education;Patient/family education;Orthotic Fit/Training;Passive range of motion;Energy conservation    PT Next Visit Plan Continue with progressive ambulation and  LE strengthening. Continue challenging dynamic and standing balance with reaching and internal perturbations.    PT Home Exercise Plan no updates  this session; Reminder to patient to continue with daily walking    Consulted and Agree with Plan of Care Patient             Patient will benefit from skilled therapeutic intervention in order to improve the following deficits and impairments:  Abnormal gait, Decreased balance, Decreased endurance, Decreased mobility, Difficulty walking, Hypomobility, Obesity, Improper body mechanics, Decreased activity tolerance, Decreased strength, Postural dysfunction  Visit Diagnosis: Abnormality of gait and mobility  Muscle weakness (generalized)  Unsteadiness on feet     Problem List Patient Active Problem List   Diagnosis Date Noted   Impaired glucose tolerance 03/23/2020   Learning disability 03/23/2020   Urinary and fecal incontinence 06/14/2019   Chronic pain syndrome 03/23/2019   Constipation 03/23/2019   Dysarthria 03/23/2019   Dysphagia 03/23/2019   Learning difficulty 03/23/2019   Morbid obesity ([place]) 03/23/2019   Muscle weakness 03/23/2019   Vitamin D deficiency 03/23/2019   Bilateral carotid artery stenosis 12/15/2018   Moderate aortic valve stenosis 12/15/2018   BMI 45.0-49.9, adult ([place]) 11/10/2018   Cerebrovascular accident ([place]) 05/02/2016   Hemiparesis affecting right side as late effect of cerebrovascular accident ([place]) 05/01/2016   Brain tumor ([place]) 11/24/2014   Gastro-esophageal reflux disease without esophagitis 11/24/2014   Accident due to mechanical fall without injury 08/03/2014   Essential hypertension 06/13/2014   Mixed hyperlipidemia 06/13/2014   Unspecified sequelae of cerebral infarction 06/13/2014   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/2014   Type 2 diabetes mellitus without complication ([place]) 35/67/0141   Neck mass 09/08/2013   Swimmer's ear 09/08/2013   Erectile dysfunction 01/03/2013   Prediabetes 01/03/2013   Janna Arch, PT, DPT  01/22/2021, 2:47 PM  [place] [facility] MAIN [facility] SERVICES [address] [place], [place], [address] Phone: [phone]   Fax:  [phone]  Name: LORRIS CARDUCCI MRN: [id number] Date of Birth: Jul 27, 1965

## 2021-01-24 ENCOUNTER — Other Ambulatory Visit: Payer: Self-pay

## 2021-01-24 ENCOUNTER — Ambulatory Visit: Payer: Medicare Other

## 2021-01-24 DIAGNOSIS — M6281 Muscle weakness (generalized): Secondary | ICD-10-CM

## 2021-01-24 DIAGNOSIS — R269 Unspecified abnormalities of gait and mobility: Secondary | ICD-10-CM

## 2021-01-24 DIAGNOSIS — R2681 Unsteadiness on feet: Secondary | ICD-10-CM

## 2021-01-24 NOTE — Therapy (Signed)
[place] MAIN [facility] SERVICES [address] [place], [place], [address] Phone: [phone]   Fax:  [phone]  Physical Therapy Treatment  Patient Details  Name: Curtis Powell MRN: [id number] Date of Birth: 1965-09-10 Referring Provider (PT): Kym Groom MD   Encounter Date: 01/24/2021   PT End of Session - 01/24/21 1241     Visit Number 27    Number of Visits 42    Date for PT Re-Evaluation 03/19/21    Authorization Type UHC Medicare; [place] Medicaid    PT Start Time 1259    PT Stop Time 1344    PT Time Calculation (min) 45 min    Equipment Utilized During Treatment Gait belt    Activity Tolerance Patient tolerated treatment well;Patient limited by fatigue    Behavior During Therapy WFL for tasks assessed/performed             Past Medical History:  Diagnosis Date   GERD (gastroesophageal reflux disease)    Hyperlipidemia    Hypertension    Obesity    Stroke [facility])     Past Surgical History:  Procedure Laterality Date   BRAIN SURGERY      There were no vitals filed for this visit.   Subjective Assessment - 01/24/21 1332     Subjective Patient reports no falls since last session. Has been compliant with HEP.    Pertinent History Patient is a pleasant 55 year old male who presents to physical therapy for weakness and immobility secondary to CVA.  Had a stroke in Feb 2018. He was previously fully independent, but this stroke caused severe residual deficits, mainly on the right side. He reports difficulty walking and decreased mobility. His mom had to move in with him and now is his main caregiver.  Patient had outpatient PT from November 2018-March 2022. Patient reports since being discharged from outpatient therapy he has kept up with his HEP daily. He does report desire for walking better and for getting in and out of bed easier. He reports his mom is helping with some ADLs. He reports needing help with rolling in bed. He reports his  mom/aide assist with dressing. He has a home aide 3 days out of the week;    Limitations Standing;Walking    How long can you sit comfortably? NA    How long can you stand comfortably? <30 min, does minimal standing;    How long can you walk comfortably? depends on the day, fatigue affects his walking; He is walking every day, about 15-20 min;    Diagnostic tests None recent    Patient Stated Goals to walk better and be independent in bed mobility;    Currently in Pain? No/denies                Treatment:   Therapeutic Ex: Seated GTB Hamstring curls, 10x each side Seated GTB adduction 12x each LE 5lb ankle weight: -march 12x each LE with cue for height of movement -LAQ 12x each LE with cue for hold for 3 seconds -alternating ER with visual cue for arc of movement   STS x10, cuing for standing tall and powering up.   Gait training x110 feet. Tactile cuing for hip extension and glute activation during stance to promote foot clearance and weight shift. Patient with improved gait pattern compared to previous sessions; toe drag and weight shift only challenged     Negotiate two cones: weaving between two cones 10 ft from patient chair  with RW, cues for keeping walker close, keeping feet within walker; x4 trials     Therapeutic Activities:   Toileting (void in standing) performed 1x at end of session with patient requiring assistance with donning doffing pants, stabilizing self, and washing of hands. Pt held onto grab bars for safety and stabilization.   Stand pivot transfer w/c<>chair. Difficulty with pivot steps, maneuvering RW (PT assisted) and foot clearance. PT directed for safe alignment to sitting surface.   Wheeling into/out of bathroom with focus on lip, requires assistance for entering bathroom, supervision only for exiting.    Pt educated throughout session about proper posture and technique with exercises. Improved exercise technique, movement at target joints, use  of target muscles after min to mod verbal, visual, tactile cues  Patient is challenged with negotiating obstacles with RW with challenge of placement of RW and self within walker. Patient able to perform ambulation for >100 ft without LOB and moderate fatigue. Patient will continue to benefit from skilled PT to improve strength, funcitonal mobility, balance, and independence with ADLs                      PT Education - 01/24/21 1240     Education Details exercise technique, body mechanics    Person(s) Educated Patient    Methods Explanation;Demonstration;Tactile cues;Verbal cues    Comprehension Verbalized understanding;Returned demonstration;Verbal cues required;Tactile cues required              PT Short Term Goals - 01/22/21 1436       PT SHORT TERM GOAL #1   Title Patient will be adherent to HEP at least 3x a week to improve functional strength and balance for better safety at home.    Baseline 11/15/2020- Patient reports performing seated LE hip march, knee ext, sit to stand and walking daily and states no questions. Advised patient to practice static standing using his walker or kitchen counter several times per day without UE support.    Time 4    Period Weeks    Status Achieved    Target Date 11/01/20      PT SHORT TERM GOAL #2   Title Patient will be independent in bed mobility including sit to supine utilizing bed rails as needed for increased mobility at home.    Baseline 6/29: requires min A 8/24: patient reports needing mod A    Time 4    Period Weeks    Status On-going    Target Date 02/19/21               PT Long Term Goals - 01/22/21 1431       PT LONG TERM GOAL #1   Title Patient will improve FOTO score to > 50% to indicate improve functional mobility at home and in community.    Baseline 6/29: 42% 8/24: 40% 10/17: 40%    Time 8    Period Weeks    Status On-going    Target Date 03/19/21      PT LONG TERM GOAL #2   Title  Patient (< 51 years old) will complete five times sit to stand test in < 20 seconds indicating an increased LE strength and improved balance.    Baseline 6/29: 23.8 sec with 2HHA. 11/15/2020= 48 sec with BUE Support and CGA using gait belt. 8/24: 23 seconds with 2 HHA 10/17: 19.23 seconds with BUE support    Time 8    Period Weeks    Status  Achieved      PT LONG TERM GOAL #3   Title Patient will increase 10 meter walk test to >0.15 m/s (60 sec or less) as to improve gait speed for better home ambulation and to reduce fall risk.    Baseline 6/29: 0.08 m/s with RW. 11/15/2020= 0.093 m/s with RW 8/24: 0.15 m/s    Time 8    Period Weeks    Status Achieved      PT LONG TERM GOAL #4   Title Patient will increase BLE gross strength to 4+/5 as to improve functional strength for independent gait, increased standing tolerance and increased ADL ability.    Baseline 6/29: see flowsheet. 8/24: hips grossly 4-/5 with hip extension 2+/5; L knee flexion 2+/5 knee extension 4/5; R knee 3+/5 10/17: see note    Time 8    Period Weeks    Status Partially Met    Target Date 03/19/21      PT LONG TERM GOAL #5   Title Patient will ambulate 250 ft with least assistive device and Supervision with no breaks to allow for increased mobility within home.    Baseline 11/15/2020-Ambulation distance = approx 110 feet using pediatric bariatric walker 8/24: 155 ft with bariatric RW 10/17: 170 ft with bariatric RW    Time 8    Period Weeks    Status Partially Met    Target Date 03/19/21      Additional Long Term Goals   Additional Long Term Goals Yes      PT LONG TERM GOAL #6   Title Patient will increase 10 meter walk test to >0.5 m/s ( 30 seconds) as to improve gait speed for better home ambulation and to reduce fall risk.    Baseline 8/24: 61 seconds: 0.15 m/s 10/17: 64 seconds ~0.15 m/s    Time 8    Period Weeks    Status Partially Met    Target Date 03/19/21      PT LONG TERM GOAL #7   Title Patient (< 35  years old) will complete five times sit to stand test in < 20 seconds with SUE support  indicating an increased LE strength and improved balance.    Baseline 10/17: 19.23 seconds with BUE support    Time 8    Period Weeks    Status New    Target Date 03/19/21                   Plan - 01/24/21 1436     Clinical Impression Statement Patient is challenged with negotiating obstacles with RW with challenge of placement of RW and self within walker. Patient able to perform ambulation for >100 ft without LOB and moderate fatigue. Patient tolerated progression of ankle weight to 5lb in seated position. Patient will continue to benefit from skilled PT to improve strength, funcitonal mobility, balance, and independence with ADLs    Personal Factors and Comorbidities Comorbidity 3+;Education;Past/Current Experience;Time since onset of injury/illness/exacerbation;Transportation    Comorbidities HTN, arthritis, past CVA, high fall risk, obesity    Examination-Activity Limitations Bed Mobility;Locomotion Level;Squat;Stairs;Stand;Toileting;Transfers    Examination-Participation Restrictions Community Activity;Driving;Cleaning;Meal Prep;Shop;Volunteer;Yard Work    Stability/Clinical Decision Making Stable/Uncomplicated    Rehab Potential Good    PT Frequency 2x / week    PT Duration 8 weeks    PT Treatment/Interventions ADLs/Self Care Home Management;Cryotherapy;Moist Heat;Gait training;Stair training;Functional mobility training;Therapeutic activities;Therapeutic exercise;Balance training;Neuromuscular re-education;Patient/family education;Orthotic Fit/Training;Passive range of motion;Energy conservation    PT Next Visit Plan Continue  with progressive ambulation and LE strengthening. Continue challenging dynamic and standing balance with reaching and internal perturbations.    PT Home Exercise Plan no updates  this session; Reminder to patient to continue with daily walking    Consulted and Agree  with Plan of Care Patient             Patient will benefit from skilled therapeutic intervention in order to improve the following deficits and impairments:  Abnormal gait, Decreased balance, Decreased endurance, Decreased mobility, Difficulty walking, Hypomobility, Obesity, Improper body mechanics, Decreased activity tolerance, Decreased strength, Postural dysfunction  Visit Diagnosis: Abnormality of gait and mobility  Muscle weakness (generalized)  Unsteadiness on feet     Problem List Patient Active Problem List   Diagnosis Date Noted   Impaired glucose tolerance 03/23/2020   Learning disability 03/23/2020   Urinary and fecal incontinence 06/14/2019   Chronic pain syndrome 03/23/2019   Constipation 03/23/2019   Dysarthria 03/23/2019   Dysphagia 03/23/2019   Learning difficulty 03/23/2019   Morbid obesity ([place]) 03/23/2019   Muscle weakness 03/23/2019   Vitamin D deficiency 03/23/2019   Bilateral carotid artery stenosis 12/15/2018   Moderate aortic valve stenosis 12/15/2018   BMI 45.0-49.9, adult ([place]) 11/10/2018   Cerebrovascular accident ([place]) 05/02/2016   Hemiparesis affecting right side as late effect of cerebrovascular accident ([place]) 05/01/2016   Brain tumor ([place]) 11/24/2014   Gastro-esophageal reflux disease without esophagitis 11/24/2014   Accident due to mechanical fall without injury 08/03/2014   Essential hypertension 06/13/2014   Mixed hyperlipidemia 06/13/2014   Unspecified sequelae of cerebral infarction 06/13/2014   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 [place], [place], [address] Phone: [phone]   Fax:  [phone]  Name: Curtis Powell MRN:  [id number] Date of Birth: 1965-07-03

## 2021-01-29 ENCOUNTER — Ambulatory Visit: Payer: Medicare Other

## 2021-01-29 ENCOUNTER — Other Ambulatory Visit: Payer: Self-pay

## 2021-01-29 DIAGNOSIS — R269 Unspecified abnormalities of gait and mobility: Secondary | ICD-10-CM

## 2021-01-29 DIAGNOSIS — M6281 Muscle weakness (generalized): Secondary | ICD-10-CM

## 2021-01-29 DIAGNOSIS — R2681 Unsteadiness on feet: Secondary | ICD-10-CM

## 2021-01-29 NOTE — Therapy (Signed)
[place] MAIN [facility] SERVICES [address] [place], [place], [address] Phone: [phone]   Fax:  [phone]  Physical Therapy Treatment  Patient Details  Name: Curtis Powell MRN: [id number] Date of Birth: 27-Dec-1965 Referring Provider (PT): Kym Groom MD   Encounter Date: 01/29/2021   PT End of Session - 01/29/21 1432     Visit Number 28    Number of Visits 42    Date for PT Re-Evaluation 03/19/21    Authorization Type UHC Medicare; [place] Medicaid    PT Start Time 3300    PT Stop Time 1429    PT Time Calculation (min) 44 min    Equipment Utilized During Treatment Gait belt    Activity Tolerance Patient tolerated treatment well;Patient limited by fatigue    Behavior During Therapy WFL for tasks assessed/performed             Past Medical History:  Diagnosis Date   GERD (gastroesophageal reflux disease)    Hyperlipidemia    Hypertension    Obesity    Stroke [facility])     Past Surgical History:  Procedure Laterality Date   BRAIN SURGERY      There were no vitals filed for this visit.   Subjective Assessment - 01/29/21 1430     Subjective Patient reports no falls or LOB since last session. Did his HEP    Pertinent History Patient is a pleasant 55 year old male who presents to physical therapy for weakness and immobility secondary to CVA.  Had a stroke in Feb 2018. He was previously fully independent, but this stroke caused severe residual deficits, mainly on the right side. He reports difficulty walking and decreased mobility. His mom had to move in with him and now is his main caregiver.  Patient had outpatient PT from November 2018-March 2022. Patient reports since being discharged from outpatient therapy he has kept up with his HEP daily. He does report desire for walking better and for getting in and out of bed easier. He reports his mom is helping with some ADLs. He reports needing help with rolling in bed. He reports his mom/aide  assist with dressing. He has a home aide 3 days out of the week;    Limitations Standing;Walking    How long can you sit comfortably? NA    How long can you stand comfortably? <30 min, does minimal standing;    How long can you walk comfortably? depends on the day, fatigue affects his walking; He is walking every day, about 15-20 min;    Diagnostic tests None recent    Patient Stated Goals to walk better and be independent in bed mobility;    Currently in Pain? No/denies                  Treatment:   Therapeutic Ex: Seated balloon taps reaching inside/outside BOS with cue for upright posture x 5 minutes   Soccer ball kicks for coordination, sequencing, timing, and muscle reactions; 10x each LE; x2 trials; second set with additional use of cones for goals for increased challenge with patient having to make 10 goals total; frequent misses with RLE due to limited muscle strength.     Gait training x140 feet. Tactile cuing for hip extension and glute activation during stance to promote foot clearance and weight shift. Patient with improved gait pattern compared to previous sessions; toe drag and weight shift only challenged      Negotiate two cones: weaving  between two cones 10 ft from patient chair with RW, cues for keeping walker close, keeping feet within walker; x2 trials   Seated Hamstring curls with GTB anchored by PT. 1x10 each LE.  Seated Hip adduction with GTB anchored by PT. 1x10 each LE.   Therapeutic Activities:   Toileting (void in standing) performed 1x at end of session with patient requiring assistance with donning doffing pants, stabilizing self, and washing of hands. Pt held onto grab bars for safety and stabilization.   Stand pivot transfer w/c<>chair. Difficulty with pivot steps, maneuvering RW (PT assisted) and foot clearance. PT directed for safe alignment to sitting surface.   Wheeling into/out of bathroom with focus on lip, requires assistance for  entering bathroom, supervision only for exiting.    Pt educated throughout session about proper posture and technique with exercises. Improved exercise technique, movement at target joints, use of target muscles after min to mod verbal, visual, tactile cues    Patient is improving with ability to negotiate turns and corners with decreased need for assistance for walker. He is fatigued by end of session with increased internal rotation and inversion of right LE. Toileting is more challenged with patient requiring min A for STS due to fatigue from previous interventions. Patient will continue to benefit from skilled PT to improve strength, funcitonal mobility, balance, and independence with ADLs                   PT Education - 01/29/21 1432     Education Details exercise technique, body mechanics    Person(s) Educated Patient    Methods Explanation;Demonstration;Tactile cues;Verbal cues    Comprehension Verbalized understanding;Returned demonstration;Verbal cues required;Tactile cues required              PT Short Term Goals - 01/22/21 1436       PT SHORT TERM GOAL #1   Title Patient will be adherent to HEP at least 3x a week to improve functional strength and balance for better safety at home.    Baseline 11/15/2020- Patient reports performing seated LE hip march, knee ext, sit to stand and walking daily and states no questions. Advised patient to practice static standing using his walker or kitchen counter several times per day without UE support.    Time 4    Period Weeks    Status Achieved    Target Date 11/01/20      PT SHORT TERM GOAL #2   Title Patient will be independent in bed mobility including sit to supine utilizing bed rails as needed for increased mobility at home.    Baseline 6/29: requires min A 8/24: patient reports needing mod A    Time 4    Period Weeks    Status On-going    Target Date 02/19/21               PT Long Term Goals -  01/22/21 1431       PT LONG TERM GOAL #1   Title Patient will improve FOTO score to > 50% to indicate improve functional mobility at home and in community.    Baseline 6/29: 42% 8/24: 40% 10/17: 40%    Time 8    Period Weeks    Status On-going    Target Date 03/19/21      PT LONG TERM GOAL #2   Title Patient (< 54 years old) will complete five times sit to stand test in < 20 seconds indicating an increased LE strength  and improved balance.    Baseline 6/29: 23.8 sec with 2HHA. 11/15/2020= 48 sec with BUE Support and CGA using gait belt. 8/24: 23 seconds with 2 HHA 10/17: 19.23 seconds with BUE support    Time 8    Period Weeks    Status Achieved      PT LONG TERM GOAL #3   Title Patient will increase 10 meter walk test to >0.15 m/s (60 sec or less) as to improve gait speed for better home ambulation and to reduce fall risk.    Baseline 6/29: 0.08 m/s with RW. 11/15/2020= 0.093 m/s with RW 8/24: 0.15 m/s    Time 8    Period Weeks    Status Achieved      PT LONG TERM GOAL #4   Title Patient will increase BLE gross strength to 4+/5 as to improve functional strength for independent gait, increased standing tolerance and increased ADL ability.    Baseline 6/29: see flowsheet. 8/24: hips grossly 4-/5 with hip extension 2+/5; L knee flexion 2+/5 knee extension 4/5; R knee 3+/5 10/17: see note    Time 8    Period Weeks    Status Partially Met    Target Date 03/19/21      PT LONG TERM GOAL #5   Title Patient will ambulate 250 ft with least assistive device and Supervision with no breaks to allow for increased mobility within home.    Baseline 11/15/2020-Ambulation distance = approx 110 feet using pediatric bariatric walker 8/24: 155 ft with bariatric RW 10/17: 170 ft with bariatric RW    Time 8    Period Weeks    Status Partially Met    Target Date 03/19/21      Additional Long Term Goals   Additional Long Term Goals Yes      PT LONG TERM GOAL #6   Title Patient will increase 10  meter walk test to >0.5 m/s ( 30 seconds) as to improve gait speed for better home ambulation and to reduce fall risk.    Baseline 8/24: 61 seconds: 0.15 m/s 10/17: 64 seconds ~0.15 m/s    Time 8    Period Weeks    Status Partially Met    Target Date 03/19/21      PT LONG TERM GOAL #7   Title Patient (< 29 years old) will complete five times sit to stand test in < 20 seconds with SUE support  indicating an increased LE strength and improved balance.    Baseline 10/17: 19.23 seconds with BUE support    Time 8    Period Weeks    Status New    Target Date 03/19/21                   Plan - 01/29/21 1433     Clinical Impression Statement Patient is improving with ability to negotiate turns and corners with decreased need for assistance for walker. He is fatigued by end of session with increased internal rotation and inversion of right LE. Toileting is more challenged with patient requiring min A for STS due to fatigue from previous interventions. Patient will continue to benefit from skilled PT to improve strength, funcitonal mobility, balance, and independence with ADLs    Personal Factors and Comorbidities Comorbidity 3+;Education;Past/Current Experience;Time since onset of injury/illness/exacerbation;Transportation    Comorbidities HTN, arthritis, past CVA, high fall risk, obesity    Examination-Activity Limitations Bed Mobility;Locomotion Level;Squat;Stairs;Stand;Toileting;Transfers    Examination-Participation Restrictions Community Activity;Driving;Cleaning;Meal Prep;Shop;Volunteer;[employer] Work  Stability/Clinical Decision Making Stable/Uncomplicated    Rehab Potential Good    PT Frequency 2x / week    PT Duration 8 weeks    PT Treatment/Interventions ADLs/Self Care Home Management;Cryotherapy;Moist Heat;Gait training;Stair training;Functional mobility training;Therapeutic activities;Therapeutic exercise;Balance training;Neuromuscular re-education;Patient/family  education;Orthotic Fit/Training;Passive range of motion;Energy conservation    PT Next Visit Plan Continue with progressive ambulation and LE strengthening. Continue challenging dynamic and standing balance with reaching and internal perturbations.    PT Home Exercise Plan no updates  this session; Reminder to patient to continue with daily walking    Consulted and Agree with Plan of Care Patient             Patient will benefit from skilled therapeutic intervention in order to improve the following deficits and impairments:  Abnormal gait, Decreased balance, Decreased endurance, Decreased mobility, Difficulty walking, Hypomobility, Obesity, Improper body mechanics, Decreased activity tolerance, Decreased strength, Postural dysfunction  Visit Diagnosis: Abnormality of gait and mobility  Muscle weakness (generalized)  Unsteadiness on feet     Problem List Patient Active Problem List   Diagnosis Date Noted   Impaired glucose tolerance 03/23/2020   Learning disability 03/23/2020   Urinary and fecal incontinence 06/14/2019   Chronic pain syndrome 03/23/2019   Constipation 03/23/2019   Dysarthria 03/23/2019   Dysphagia 03/23/2019   Learning difficulty 03/23/2019   Morbid obesity ([place]) 03/23/2019   Muscle weakness 03/23/2019   Vitamin D deficiency 03/23/2019   Bilateral carotid artery stenosis 12/15/2018   Moderate aortic valve stenosis 12/15/2018   BMI 45.0-49.9, adult ([place]) 11/10/2018   Cerebrovascular accident ([place]) 05/02/2016   Hemiparesis affecting right side as late effect of cerebrovascular accident ([place]) 05/01/2016   Brain tumor ([place]) 11/24/2014   Gastro-esophageal reflux disease without esophagitis 11/24/2014   Accident due to mechanical fall without injury 08/03/2014   Essential hypertension 06/13/2014   Mixed hyperlipidemia 06/13/2014   Unspecified sequelae of cerebral infarction 06/13/2014   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

## 2021-01-31 ENCOUNTER — Other Ambulatory Visit: Payer: Self-pay

## 2021-01-31 ENCOUNTER — Ambulatory Visit: Payer: Medicare Other

## 2021-01-31 DIAGNOSIS — R269 Unspecified abnormalities of gait and mobility: Secondary | ICD-10-CM | POA: Diagnosis not present

## 2021-01-31 DIAGNOSIS — M6281 Muscle weakness (generalized): Secondary | ICD-10-CM

## 2021-01-31 DIAGNOSIS — R2681 Unsteadiness on feet: Secondary | ICD-10-CM

## 2021-01-31 NOTE — Therapy (Signed)
[place] MAIN [facility] SERVICES [address] [place], [place], [address] Phone: [phone]   Fax:  [phone]  Physical Therapy Treatment  Patient Details  Name: Curtis Powell MRN: [id number] Date of Birth: May 24, 1965 Referring Provider (PT): Kym Groom MD   Encounter Date: 01/31/2021   PT End of Session - 01/31/21 1314     Visit Number 29    Number of Visits 42    Date for PT Re-Evaluation 03/19/21    Authorization Type [employer] Medicare; [employer]    PT Start Time 5945    PT Stop Time 1330    PT Time Calculation (min) 45 min    Equipment Utilized During Treatment Gait belt    Activity Tolerance Patient tolerated treatment well;Patient limited by fatigue    Behavior During Therapy WFL for tasks assessed/performed             Past Medical History:  Diagnosis Date   GERD (gastroesophageal reflux disease)    Hyperlipidemia    Hypertension    Obesity    Stroke [facility])     Past Surgical History:  Procedure Laterality Date   BRAIN SURGERY      There were no vitals filed for this visit.   Subjective Assessment - 01/31/21 1256     Subjective Patient reports he walked with a caregiver yesterday. Arrived early and has to use the restroom.    Pertinent History Patient is a pleasant 55 year old male who presents to physical therapy for weakness and immobility secondary to CVA.  Had a stroke in Feb 2018. He was previously fully independent, but this stroke caused severe residual deficits, mainly on the right side. He reports difficulty walking and decreased mobility. His mom had to move in with him and now is his main caregiver.  Patient had outpatient PT from November 2018-March 2022. Patient reports since being discharged from outpatient therapy he has kept up with his HEP daily. He does report desire for walking better and for getting in and out of bed easier. He reports his mom is helping with some ADLs. He reports needing help with rolling  in bed. He reports his mom/aide assist with dressing. He has a home aide 3 days out of the week;    Limitations Standing;Walking    How long can you sit comfortably? NA    How long can you stand comfortably? <30 min, does minimal standing;    How long can you walk comfortably? depends on the day, fatigue affects his walking; He is walking every day, about 15-20 min;    Diagnostic tests None recent    Patient Stated Goals to walk better and be independent in bed mobility;    Currently in Pain? No/denies              Patient arrived early to clinic, needing to use restroom, requires assistance so PT returned from lunch early to assist patient and begin session early.         Treatment:   Therapeutic Ex:    Gait training x160 feet. Tactile cuing for hip extension and glute activation during stance to promote foot clearance and weight shift. Patient with improved gait pattern compared to previous sessions; toe drag and weight shift only challenged ; no rest breaks this session   4" step toe taps ; heavy BUE support, 10x each LE; very challenging for patient.    Lateral stepping 2x length of // bars with heavy BUE support  Seated Hamstring curls with GTB anchored by PT. 1x10 each LE.     Therapeutic Activities:   Toileting (void in standing) performed 2x at beginning and end of session with patient requiring assistance with donning doffing pants, stabilizing self, and washing of hands. Pt held onto grab bars for safety and stabilization.   Stand pivot transfer w/c<>chair. Difficulty with pivot steps, maneuvering RW (PT assisted) and foot clearance. PT directed for safe alignment to sitting surface.   Wheeling into/out of bathroom with focus on lip, requires assistance for entering bathroom, supervision only for exiting.    Pt educated throughout session about proper posture and technique with exercises. Improved exercise technique, movement at target joints, use of target  muscles after min to mod verbal, visual, tactile cues   Patient's session is limited by toileting breaks required throughout session. Patient educated again on importance of bringing a caregiver and/or physician appointment for urinary frequency as it is impacting care at this time. If this continues to be area of issue will re-address with patient's mother. Patient will continue to benefit from skilled PT to improve strength, funcitonal mobility, balance, and independence with ADLs                  PT Education - 01/31/21 1313     Education Details exercise technique, body mechanics, toileting    Person(s) Educated Patient    Methods Explanation;Demonstration;Tactile cues;Verbal cues    Comprehension Verbalized understanding;Returned demonstration;Verbal cues required;Tactile cues required              PT Short Term Goals - 01/22/21 1436       PT SHORT TERM GOAL #1   Title Patient will be adherent to HEP at least 3x a week to improve functional strength and balance for better safety at home.    Baseline 11/15/2020- Patient reports performing seated LE hip march, knee ext, sit to stand and walking daily and states no questions. Advised patient to practice static standing using his walker or kitchen counter several times per day without UE support.    Time 4    Period Weeks    Status Achieved    Target Date 11/01/20      PT SHORT TERM GOAL #2   Title Patient will be independent in bed mobility including sit to supine utilizing bed rails as needed for increased mobility at home.    Baseline 6/29: requires min A 8/24: patient reports needing mod A    Time 4    Period Weeks    Status On-going    Target Date 02/19/21               PT Long Term Goals - 01/22/21 1431       PT LONG TERM GOAL #1   Title Patient will improve FOTO score to > 50% to indicate improve functional mobility at home and in community.    Baseline 6/29: 42% 8/24: 40% 10/17: 40%    Time 8     Period Weeks    Status On-going    Target Date 03/19/21      PT LONG TERM GOAL #2   Title Patient (< 43 years old) will complete five times sit to stand test in < 20 seconds indicating an increased LE strength and improved balance.    Baseline 6/29: 23.8 sec with 2HHA. 11/15/2020= 48 sec with BUE Support and CGA using gait belt. 8/24: 23 seconds with 2 HHA 10/17: 19.23 seconds with BUE support  Time 8    Period Weeks    Status Achieved      PT LONG TERM GOAL #3   Title Patient will increase 10 meter walk test to >0.15 m/s (60 sec or less) as to improve gait speed for better home ambulation and to reduce fall risk.    Baseline 6/29: 0.08 m/s with RW. 11/15/2020= 0.093 m/s with RW 8/24: 0.15 m/s    Time 8    Period Weeks    Status Achieved      PT LONG TERM GOAL #4   Title Patient will increase BLE gross strength to 4+/5 as to improve functional strength for independent gait, increased standing tolerance and increased ADL ability.    Baseline 6/29: see flowsheet. 8/24: hips grossly 4-/5 with hip extension 2+/5; L knee flexion 2+/5 knee extension 4/5; R knee 3+/5 10/17: see note    Time 8    Period Weeks    Status Partially Met    Target Date 03/19/21      PT LONG TERM GOAL #5   Title Patient will ambulate 250 ft with least assistive device and Supervision with no breaks to allow for increased mobility within home.    Baseline 11/15/2020-Ambulation distance = approx 110 feet using pediatric bariatric walker 8/24: 155 ft with bariatric RW 10/17: 170 ft with bariatric RW    Time 8    Period Weeks    Status Partially Met    Target Date 03/19/21      Additional Long Term Goals   Additional Long Term Goals Yes      PT LONG TERM GOAL #6   Title Patient will increase 10 meter walk test to >0.5 m/s ( 30 seconds) as to improve gait speed for better home ambulation and to reduce fall risk.    Baseline 8/24: 61 seconds: 0.15 m/s 10/17: 64 seconds ~0.15 m/s    Time 8    Period Weeks     Status Partially Met    Target Date 03/19/21      PT LONG TERM GOAL #7   Title Patient (< 51 years old) will complete five times sit to stand test in < 20 seconds with SUE support  indicating an increased LE strength and improved balance.    Baseline 10/17: 19.23 seconds with BUE support    Time 8    Period Weeks    Status New    Target Date 03/19/21                   Plan - 01/31/21 1339     Clinical Impression Statement Patient's session is limited by toileting breaks required throughout session. Patient educated again on importance of bringing a caregiver and/or physician appointment for urinary frequency as it is impacting care at this time. If this continues to be area of issue will re-address with patient's mother. Patient will continue to benefit from skilled PT to improve strength, funcitonal mobility, balance, and independence with ADLs    Personal Factors and Comorbidities Comorbidity 3+;Education;Past/Current Experience;Time since onset of injury/illness/exacerbation;Transportation    Comorbidities HTN, arthritis, past CVA, high fall risk, obesity    Examination-Activity Limitations Bed Mobility;Locomotion Level;Squat;Stairs;Stand;Toileting;Transfers    Examination-Participation Restrictions Community Activity;Driving;Cleaning;Meal Prep;Shop;Volunteer;Yard Work    Stability/Clinical Decision Making Stable/Uncomplicated    Rehab Potential Good    PT Frequency 2x / week    PT Duration 8 weeks    PT Treatment/Interventions ADLs/Self Care Home Management;Cryotherapy;Moist Heat;Gait training;Stair training;Functional mobility training;Therapeutic activities;Therapeutic exercise;Balance training;Neuromuscular  re-education;Patient/family education;Orthotic Fit/Training;Passive range of motion;Energy conservation    PT Next Visit Plan Continue with progressive ambulation and LE strengthening. Continue challenging dynamic and standing balance with reaching and internal  perturbations.    PT Home Exercise Plan no updates  this session; Reminder to patient to continue with daily walking    Consulted and Agree with Plan of Care Patient             Patient will benefit from skilled therapeutic intervention in order to improve the following deficits and impairments:  Abnormal gait, Decreased balance, Decreased endurance, Decreased mobility, Difficulty walking, Hypomobility, Obesity, Improper body mechanics, Decreased activity tolerance, Decreased strength, Postural dysfunction  Visit Diagnosis: Abnormality of gait and mobility  Muscle weakness (generalized)  Unsteadiness on feet     Problem List Patient Active Problem List   Diagnosis Date Noted   Impaired glucose tolerance 03/23/2020   Learning disability 03/23/2020   Urinary and fecal incontinence 06/14/2019   Chronic pain syndrome 03/23/2019   Constipation 03/23/2019   Dysarthria 03/23/2019   Dysphagia 03/23/2019   Learning difficulty 03/23/2019   Morbid obesity ([place]) 03/23/2019   Muscle weakness 03/23/2019   Vitamin D deficiency 03/23/2019   Bilateral carotid artery stenosis 12/15/2018   Moderate aortic valve stenosis 12/15/2018   BMI 45.0-49.9, adult ([place]) 11/10/2018   Cerebrovascular accident ([place]) 05/02/2016   Hemiparesis affecting right side as late effect of cerebrovascular accident ([place]) 05/01/2016   Brain tumor ([place]) 11/24/2014   Gastro-esophageal reflux disease without esophagitis 11/24/2014   Accident due to mechanical fall without injury 08/03/2014   Essential hypertension 06/13/2014   Mixed hyperlipidemia 06/13/2014   Unspecified sequelae of cerebral infarction 06/13/2014   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

## 2021-02-05 ENCOUNTER — Other Ambulatory Visit: Payer: Self-pay

## 2021-02-05 ENCOUNTER — Ambulatory Visit: Payer: Medicare Other

## 2021-02-05 DIAGNOSIS — R2681 Unsteadiness on feet: Secondary | ICD-10-CM

## 2021-02-05 DIAGNOSIS — R269 Unspecified abnormalities of gait and mobility: Secondary | ICD-10-CM

## 2021-02-05 DIAGNOSIS — M6281 Muscle weakness (generalized): Secondary | ICD-10-CM

## 2021-02-05 NOTE — Therapy (Signed)
[place] MAIN [facility] SERVICES [address] [place], [place], [address] Phone: [phone]   Fax:  [phone]  Physical Therapy Treatment Physical Therapy Progress Note   Dates of reporting period  12/25/20   to   02/05/21   Patient Details  Name: Curtis Powell MRN: [id number] Date of Birth: 11-May-1965 Referring Provider (PT): Kym Groom MD   Encounter Date: 02/05/2021   PT End of Session - 02/05/21 1331     Visit Number 30    Number of Visits 42    Date for PT Re-Evaluation 03/19/21    Authorization Type UHC Medicare;  Medicaid    Authorization Time Period next session 1/10 10/31    PT Start Time 1345    PT Stop Time 1429    PT Time Calculation (min) 44 min    Equipment Utilized During Treatment Gait belt    Activity Tolerance Patient tolerated treatment well;Patient limited by fatigue    Behavior During Therapy WFL for tasks assessed/performed             Past Medical History:  Diagnosis Date   GERD (gastroesophageal reflux disease)    Hyperlipidemia    Hypertension    Obesity    Stroke [facility])     Past Surgical History:  Procedure Laterality Date   BRAIN SURGERY      There were no vitals filed for this visit.   Subjective Assessment - 02/05/21 1436     Subjective Patient reports he walked with caregiver over the weekend. No falls or LOB since last session.    Pertinent History Patient is a pleasant 55 year old male who presents to physical therapy for weakness and immobility secondary to CVA.  Had a stroke in Feb 2018. He was previously fully independent, but this stroke caused severe residual deficits, mainly on the right side. He reports difficulty walking and decreased mobility. His mom had to move in with him and now is his main caregiver.  Patient had outpatient PT from November 2018-March 2022. Patient reports since being discharged from outpatient therapy he has kept up with his HEP daily. He does report desire for  walking better and for getting in and out of bed easier. He reports his mom is helping with some ADLs. He reports needing help with rolling in bed. He reports his mom/aide assist with dressing. He has a home aide 3 days out of the week;    Limitations Standing;Walking    How long can you sit comfortably? NA    How long can you stand comfortably? <30 min, does minimal standing;    How long can you walk comfortably? depends on the day, fatigue affects his walking; He is walking every day, about 15-20 min;    Diagnostic tests None recent    Patient Stated Goals to walk better and be independent in bed mobility;    Currently in Pain? No/denies                       Patient performed goals on 01/22/21 , two weeks prior, please refer to this note for further details  Treatment:   Therapeutic Ex:    Gait training x140 feet. Tactile cuing for hip extension and glute activation during stance to promote foot clearance and weight shift. Patient with improved gait pattern compared to previous sessions; toe drag and weight shift only challenged       4" step toe taps ; heavy BUE support,  10x each LE; very challenging for patient.    Lateral stepping 2x length of // bars with heavy BUE support    Static stand no UE support 30 seconds   Seated Hamstring curls with GTB anchored by PT. 1x10 each LE.   Seated Hip adduction with GTB anchored by PT. 1x10 each LE.    Therapeutic Activities:   Toileting (void in standing) performed 1x at end of session with patient requiring assistance with donning doffing pants, stabilizing self, and washing of hands. Pt held onto grab bars for safety and stabilization.   Stand pivot transfer w/c<>chair. Difficulty with pivot steps, maneuvering RW (PT assisted) and foot clearance. PT directed for safe alignment to sitting surface.   Wheeling into/out of bathroom with focus on lip, requires assistance for entering bathroom, supervision only for exiting.     Pt educated throughout session about proper posture and technique with exercises. Improved exercise technique, movement at target joints, use of target muscles after min to mod verbal, visual, tactile cues     Patient's condition has the potential to improve in response to therapy. Maximum improvement is yet to be obtained. The anticipated improvement is attainable and reasonable in a generally predictable time.  Patient reports he wants to continue therapy.    Patient performed goals on 01/22/21 , two weeks prior, please refer to this note for further details. Patient is able to tolerate majority of session in standing this session indicating increased capacity for functional mobility at this time. He continues to have limited session due to toileting needs at end of session. Patient's condition has the potential to improve in response to therapy. Maximum improvement is yet to be obtained. The anticipated improvement is attainable and reasonable in a generally predictable time. Patient will continue to benefit from skilled PT to improve strength, funcitonal mobility, balance, and independence with ADLs            PT Education - 02/05/21 1330     Education Details progress note, exercise technique, body mechanics    Person(s) Educated Patient    Methods Explanation;Demonstration;Tactile cues;Verbal cues    Comprehension Verbalized understanding;Returned demonstration;Verbal cues required;Tactile cues required              PT Short Term Goals - 01/22/21 1436       PT SHORT TERM GOAL #1   Title Patient will be adherent to HEP at least 3x a week to improve functional strength and balance for better safety at home.    Baseline 11/15/2020- Patient reports performing seated LE hip march, knee ext, sit to stand and walking daily and states no questions. Advised patient to practice static standing using his walker or kitchen counter several times per day without UE support.    Time  4    Period Weeks    Status Achieved    Target Date 11/01/20      PT SHORT TERM GOAL #2   Title Patient will be independent in bed mobility including sit to supine utilizing bed rails as needed for increased mobility at home.    Baseline 6/29: requires min A 8/24: patient reports needing mod A    Time 4    Period Weeks    Status On-going    Target Date 02/19/21               PT Long Term Goals - 01/22/21 1431       PT LONG TERM GOAL #1   Title Patient will improve  FOTO score to > 50% to indicate improve functional mobility at home and in community.    Baseline 6/29: 42% 8/24: 40% 10/17: 40%    Time 8    Period Weeks    Status On-going    Target Date 03/19/21      PT LONG TERM GOAL #2   Title Patient (< 34 years old) will complete five times sit to stand test in < 20 seconds indicating an increased LE strength and improved balance.    Baseline 6/29: 23.8 sec with 2HHA. 11/15/2020= 48 sec with BUE Support and CGA using gait belt. 8/24: 23 seconds with 2 HHA 10/17: 19.23 seconds with BUE support    Time 8    Period Weeks    Status Achieved      PT LONG TERM GOAL #3   Title Patient will increase 10 meter walk test to >0.15 m/s (60 sec or less) as to improve gait speed for better home ambulation and to reduce fall risk.    Baseline 6/29: 0.08 m/s with RW. 11/15/2020= 0.093 m/s with RW 8/24: 0.15 m/s    Time 8    Period Weeks    Status Achieved      PT LONG TERM GOAL #4   Title Patient will increase BLE gross strength to 4+/5 as to improve functional strength for independent gait, increased standing tolerance and increased ADL ability.    Baseline 6/29: see flowsheet. 8/24: hips grossly 4-/5 with hip extension 2+/5; L knee flexion 2+/5 knee extension 4/5; R knee 3+/5 10/17: see note    Time 8    Period Weeks    Status Partially Met    Target Date 03/19/21      PT LONG TERM GOAL #5   Title Patient will ambulate 250 ft with least assistive device and Supervision with no  breaks to allow for increased mobility within home.    Baseline 11/15/2020-Ambulation distance = approx 110 feet using pediatric bariatric walker 8/24: 155 ft with bariatric RW 10/17: 170 ft with bariatric RW    Time 8    Period Weeks    Status Partially Met    Target Date 03/19/21      Additional Long Term Goals   Additional Long Term Goals Yes      PT LONG TERM GOAL #6   Title Patient will increase 10 meter walk test to >0.5 m/s ( 30 seconds) as to improve gait speed for better home ambulation and to reduce fall risk.    Baseline 8/24: 61 seconds: 0.15 m/s 10/17: 64 seconds ~0.15 m/s    Time 8    Period Weeks    Status Partially Met    Target Date 03/19/21      PT LONG TERM GOAL #7   Title Patient (< 33 years old) will complete five times sit to stand test in < 20 seconds with SUE support  indicating an increased LE strength and improved balance.    Baseline 10/17: 19.23 seconds with BUE support    Time 8    Period Weeks    Status New    Target Date 03/19/21                   Plan - 02/05/21 1438     Clinical Impression Statement Patient performed goals on 01/22/21 , two weeks prior, please refer to this note for further details. Patient is able to tolerate majority of session in standing this session indicating increased capacity for  functional mobility at this time. He continues to have limited session due to toileting needs at end of session. ?Patient's condition has the potential to improve in response to therapy. Maximum improvement is yet to be obtained. The anticipated improvement is attainable and reasonable in a generally predictable time. Patient will continue to benefit from skilled PT to improve strength, funcitonal mobility, balance, and independence with ADLs    Personal Factors and Comorbidities Comorbidity 3+;Education;Past/Current Experience;Time since onset of injury/illness/exacerbation;Transportation    Comorbidities HTN, arthritis, past CVA, high fall  risk, obesity    Examination-Activity Limitations Bed Mobility;Locomotion Level;Squat;Stairs;Stand;Toileting;Transfers    Examination-Participation Restrictions Community Activity;Driving;Cleaning;Meal Prep;Shop;Volunteer;Yard Work    Stability/Clinical Decision Making Stable/Uncomplicated    Rehab Potential Good    PT Frequency 2x / week    PT Duration 8 weeks    PT Treatment/Interventions ADLs/Self Care Home Management;Cryotherapy;Moist Heat;Gait training;Stair training;Functional mobility training;Therapeutic activities;Therapeutic exercise;Balance training;Neuromuscular re-education;Patient/family education;Orthotic Fit/Training;Passive range of motion;Energy conservation    PT Next Visit Plan Continue with progressive ambulation and LE strengthening. Continue challenging dynamic and standing balance with reaching and internal perturbations.    PT Home Exercise Plan no updates  this session; Reminder to patient to continue with daily walking    Consulted and Agree with Plan of Care Patient             Patient will benefit from skilled therapeutic intervention in order to improve the following deficits and impairments:  Abnormal gait, Decreased balance, Decreased endurance, Decreased mobility, Difficulty walking, Hypomobility, Obesity, Improper body mechanics, Decreased activity tolerance, Decreased strength, Postural dysfunction  Visit Diagnosis: Abnormality of gait and mobility  Muscle weakness (generalized)  Unsteadiness on feet     Problem List Patient Active Problem List   Diagnosis Date Noted   Impaired glucose tolerance 03/23/2020   Learning disability 03/23/2020   Urinary and fecal incontinence 06/14/2019   Chronic pain syndrome 03/23/2019   Constipation 03/23/2019   Dysarthria 03/23/2019   Dysphagia 03/23/2019   Learning difficulty 03/23/2019   Morbid obesity ([place]) 03/23/2019   Muscle weakness 03/23/2019   Vitamin D deficiency 03/23/2019   Bilateral carotid  artery stenosis 12/15/2018   Moderate aortic valve stenosis 12/15/2018   BMI 45.0-49.9, adult ([place]) 11/10/2018   Cerebrovascular accident ([place]) 05/02/2016   Hemiparesis affecting right side as late effect of cerebrovascular accident ([place]) 05/01/2016   Brain tumor ([place]) 11/24/2014   Gastro-esophageal reflux disease without esophagitis 11/24/2014   Accident due to mechanical fall without injury 08/03/2014   Essential hypertension 06/13/2014   Mixed hyperlipidemia 06/13/2014   Unspecified sequelae of cerebral infarction 06/13/2014   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/2014   Type 2 diabetes mellitus without complication ([place]) 15/17/6160   Neck mass 09/08/2013   Swimmer's ear 09/08/2013   Erectile dysfunction 01/03/2013   Prediabetes 01/03/2013    Janna Arch, PT, DPT  02/05/2021, 2:39 PM  [facility] [facility] MAIN [facility] SERVICES [address] [place], [place], [address] Phone: [phone]   Fax:  [phone]  Name: Curtis Powell MRN: [id number] Date of Birth: 1966-03-03

## 2021-02-07 ENCOUNTER — Ambulatory Visit: Payer: Medicare Other | Attending: Family Medicine

## 2021-02-07 ENCOUNTER — Other Ambulatory Visit: Payer: Self-pay

## 2021-02-07 DIAGNOSIS — R262 Difficulty in walking, not elsewhere classified: Secondary | ICD-10-CM | POA: Diagnosis present

## 2021-02-07 DIAGNOSIS — R2681 Unsteadiness on feet: Secondary | ICD-10-CM | POA: Diagnosis present

## 2021-02-07 DIAGNOSIS — M6281 Muscle weakness (generalized): Secondary | ICD-10-CM | POA: Insufficient documentation

## 2021-02-07 DIAGNOSIS — R278 Other lack of coordination: Secondary | ICD-10-CM | POA: Diagnosis present

## 2021-02-07 DIAGNOSIS — R269 Unspecified abnormalities of gait and mobility: Secondary | ICD-10-CM | POA: Insufficient documentation

## 2021-02-07 NOTE — Therapy (Signed)
[place] MAIN [facility] SERVICES [address] [place], [place], [address] Phone: [phone]   Fax:  [phone]  Physical Therapy Treatment  Patient Details  Name: Curtis Powell MRN: [id number] Date of Birth: 04-08-1966 Referring Provider (PT): Kym Groom MD   Encounter Date: 02/07/2021   PT End of Session - 02/07/21 1245     Visit Number 31    Number of Visits 42    Date for PT Re-Evaluation 03/19/21    Authorization Type UHC Medicare; [place] Medicaid    Authorization Time Period next session 1/10 10/31    PT Start Time 1259    PT Stop Time 1344    PT Time Calculation (min) 45 min    Equipment Utilized During Treatment Gait belt    Activity Tolerance Patient tolerated treatment well;Patient limited by fatigue    Behavior During Therapy WFL for tasks assessed/performed             Past Medical History:  Diagnosis Date   GERD (gastroesophageal reflux disease)    Hyperlipidemia    Hypertension    Obesity    Stroke [facility])     Past Surgical History:  Procedure Laterality Date   BRAIN SURGERY      There were no vitals filed for this visit.   Subjective Assessment - 02/07/21 1303     Subjective Patient reports his son and daughter in law are coming into town for the day, is excited. No falls or LOB since last session.    Pertinent History Patient is a pleasant 55 year old male who presents to physical therapy for weakness and immobility secondary to CVA.  Had a stroke in Feb 2018. He was previously fully independent, but this stroke caused severe residual deficits, mainly on the right side. He reports difficulty walking and decreased mobility. His mom had to move in with him and now is his main caregiver.  Patient had outpatient PT from November 2018-March 2022. Patient reports since being discharged from outpatient therapy he has kept up with his HEP daily. He does report desire for walking better and for getting in and out of bed easier.  He reports his mom is helping with some ADLs. He reports needing help with rolling in bed. He reports his mom/aide assist with dressing. He has a home aide 3 days out of the week;    Limitations Standing;Walking    How long can you sit comfortably? NA    How long can you stand comfortably? <30 min, does minimal standing;    How long can you walk comfortably? depends on the day, fatigue affects his walking; He is walking every day, about 15-20 min;    Diagnostic tests None recent    Patient Stated Goals to walk better and be independent in bed mobility;    Currently in Pain? No/denies                   Therapeutic Ex:    Gait training x160 feet. Tactile cuing for hip extension and glute activation during stance to promote foot clearance and weight shift. Patient with improved gait pattern compared to previous sessions; toe drag and weight shift only challenged     Stand with RW   -Static stand no UE support 30 seconds; x2 trials   -3lb ankle weights: march 10x each LE, heavy BUE support; side step 10x each LE; heavy BUE support  Seated: 3lb ankle weights: -LAQ 10x 3 second holds; each LE -  march 10x each LE with cue for upright posture     Therapeutic Activities:   Toileting (void in standing) performed 1x at end of session with patient requiring assistance with donning doffing pants, stabilizing self, and washing of hands. Pt held onto grab bars for safety and stabilization.   Stand pivot transfer w/c<>chair. Difficulty with pivot steps, maneuvering RW (PT assisted) and foot clearance. PT directed for safe alignment to sitting surface.   Wheeling into/out of bathroom with focus on lip, requires assistance for entering bathroom, supervision only for exiting.    Pt educated throughout session about proper posture and technique with exercises. Improved exercise technique, movement at target joints, use of target muscles after min to mod verbal, visual, tactile cues      Patient is highly motivated throughout physical therapy. He has poor awareness of abilities this session however reporting he wants to be able to run again, requires education on need for increased safety and capacity for walking. Patient is challenged with foot clearance with prolonged muscle recruitment. Patient will continue to benefit from skilled PT to improve strength, funcitonal mobility, balance, and independence with ADLs               PT Education - 02/07/21 1244     Education Details exercise technique, body mechanics    Person(s) Educated Patient    Methods Explanation;Demonstration;Tactile cues;Verbal cues    Comprehension Verbalized understanding;Returned demonstration;Verbal cues required;Tactile cues required              PT Short Term Goals - 01/22/21 1436       PT SHORT TERM GOAL #1   Title Patient will be adherent to HEP at least 3x a week to improve functional strength and balance for better safety at home.    Baseline 11/15/2020- Patient reports performing seated LE hip march, knee ext, sit to stand and walking daily and states no questions. Advised patient to practice static standing using his walker or kitchen counter several times per day without UE support.    Time 4    Period Weeks    Status Achieved    Target Date 11/01/20      PT SHORT TERM GOAL #2   Title Patient will be independent in bed mobility including sit to supine utilizing bed rails as needed for increased mobility at home.    Baseline 6/29: requires min A 8/24: patient reports needing mod A    Time 4    Period Weeks    Status On-going    Target Date 02/19/21               PT Long Term Goals - 01/22/21 1431       PT LONG TERM GOAL #1   Title Patient will improve FOTO score to > 50% to indicate improve functional mobility at home and in community.    Baseline 6/29: 42% 8/24: 40% 10/17: 40%    Time 8    Period Weeks    Status On-going    Target Date 03/19/21      PT  LONG TERM GOAL #2   Title Patient (< 12 years old) will complete five times sit to stand test in < 20 seconds indicating an increased LE strength and improved balance.    Baseline 6/29: 23.8 sec with 2HHA. 11/15/2020= 48 sec with BUE Support and CGA using gait belt. 8/24: 23 seconds with 2 HHA 10/17: 19.23 seconds with BUE support    Time 8  Period Weeks    Status Achieved      PT LONG TERM GOAL #3   Title Patient will increase 10 meter walk test to >0.15 m/s (60 sec or less) as to improve gait speed for better home ambulation and to reduce fall risk.    Baseline 6/29: 0.08 m/s with RW. 11/15/2020= 0.093 m/s with RW 8/24: 0.15 m/s    Time 8    Period Weeks    Status Achieved      PT LONG TERM GOAL #4   Title Patient will increase BLE gross strength to 4+/5 as to improve functional strength for independent gait, increased standing tolerance and increased ADL ability.    Baseline 6/29: see flowsheet. 8/24: hips grossly 4-/5 with hip extension 2+/5; L knee flexion 2+/5 knee extension 4/5; R knee 3+/5 10/17: see note    Time 8    Period Weeks    Status Partially Met    Target Date 03/19/21      PT LONG TERM GOAL #5   Title Patient will ambulate 250 ft with least assistive device and Supervision with no breaks to allow for increased mobility within home.    Baseline 11/15/2020-Ambulation distance = approx 110 feet using pediatric bariatric walker 8/24: 155 ft with bariatric RW 10/17: 170 ft with bariatric RW    Time 8    Period Weeks    Status Partially Met    Target Date 03/19/21      Additional Long Term Goals   Additional Long Term Goals Yes      PT LONG TERM GOAL #6   Title Patient will increase 10 meter walk test to >0.5 m/s ( 30 seconds) as to improve gait speed for better home ambulation and to reduce fall risk.    Baseline 8/24: 61 seconds: 0.15 m/s 10/17: 64 seconds ~0.15 m/s    Time 8    Period Weeks    Status Partially Met    Target Date 03/19/21      PT LONG TERM GOAL  #7   Title Patient (< 37 years old) will complete five times sit to stand test in < 20 seconds with SUE support  indicating an increased LE strength and improved balance.    Baseline 10/17: 19.23 seconds with BUE support    Time 8    Period Weeks    Status New    Target Date 03/19/21                   Plan - 02/07/21 1330     Clinical Impression Statement Patient is highly motivated throughout physical therapy. He has poor awareness of abilities this session however reporting he wants to be able to run again, requires education on need for increased safety and capacity for walking. Patient is challenged with foot clearance with prolonged muscle recruitment. Patient will continue to benefit from skilled PT to improve strength, funcitonal mobility, balance, and independence with ADLs    Personal Factors and Comorbidities Comorbidity 3+;Education;Past/Current Experience;Time since onset of injury/illness/exacerbation;Transportation    Comorbidities HTN, arthritis, past CVA, high fall risk, obesity    Examination-Activity Limitations Bed Mobility;Locomotion Level;Squat;Stairs;Stand;Toileting;Transfers    Examination-Participation Restrictions Community Activity;Driving;Cleaning;Meal Prep;Shop;Volunteer;Yard Work    Stability/Clinical Decision Making Stable/Uncomplicated    Rehab Potential Good    PT Frequency 2x / week    PT Duration 8 weeks    PT Treatment/Interventions ADLs/Self Care Home Management;Cryotherapy;Moist Heat;Gait training;Stair training;Functional mobility training;Therapeutic activities;Therapeutic exercise;Balance training;Neuromuscular re-education;Patient/family education;Orthotic Fit/Training;Passive range of  motion;Energy conservation    PT Next Visit Plan Continue with progressive ambulation and LE strengthening. Continue challenging dynamic and standing balance with reaching and internal perturbations.    PT Home Exercise Plan no updates  this session; Reminder to  patient to continue with daily walking    Consulted and Agree with Plan of Care Patient             Patient will benefit from skilled therapeutic intervention in order to improve the following deficits and impairments:  Abnormal gait, Decreased balance, Decreased endurance, Decreased mobility, Difficulty walking, Hypomobility, Obesity, Improper body mechanics, Decreased activity tolerance, Decreased strength, Postural dysfunction  Visit Diagnosis: Abnormality of gait and mobility  Muscle weakness (generalized)  Unsteadiness on feet     Problem List Patient Active Problem List   Diagnosis Date Noted   Impaired glucose tolerance 03/23/2020   Learning disability 03/23/2020   Urinary and fecal incontinence 06/14/2019   Chronic pain syndrome 03/23/2019   Constipation 03/23/2019   Dysarthria 03/23/2019   Dysphagia 03/23/2019   Learning difficulty 03/23/2019   Morbid obesity ([place]) 03/23/2019   Muscle weakness 03/23/2019   Vitamin D deficiency 03/23/2019   Bilateral carotid artery stenosis 12/15/2018   Moderate aortic valve stenosis 12/15/2018   BMI 45.0-49.9, adult ([place]) 11/10/2018   Cerebrovascular accident ([place]) 05/02/2016   Hemiparesis affecting right side as late effect of cerebrovascular accident ([place]) 05/01/2016   Brain tumor ([place]) 11/24/2014   Gastro-esophageal reflux disease without esophagitis 11/24/2014   Accident due to mechanical fall without injury 08/03/2014   Essential hypertension 06/13/2014   Mixed hyperlipidemia 06/13/2014   Unspecified sequelae of cerebral infarction 06/13/2014   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

## 2021-02-12 ENCOUNTER — Ambulatory Visit: Payer: Medicare Other

## 2021-02-14 ENCOUNTER — Other Ambulatory Visit: Payer: Self-pay

## 2021-02-14 ENCOUNTER — Ambulatory Visit: Payer: Medicare Other

## 2021-02-14 DIAGNOSIS — R269 Unspecified abnormalities of gait and mobility: Secondary | ICD-10-CM

## 2021-02-14 DIAGNOSIS — R278 Other lack of coordination: Secondary | ICD-10-CM

## 2021-02-14 NOTE — Therapy (Signed)
[place] MAIN [facility] SERVICES [address] [place], [place], [address] Phone: [phone]   Fax:  [phone]  Physical Therapy Treatment  Patient Details  Name: Curtis Powell MRN: [id number] Date of Birth: Nov 21, 1965 Referring Provider (PT): Kym Groom MD   Encounter Date: 02/14/2021   PT End of Session - 02/14/21 1407     Visit Number 32    Number of Visits 42    Date for PT Re-Evaluation 03/19/21    Authorization Type UHC Medicare; [place] Medicaid    Authorization Time Period next session 1/10 10/31    PT Start Time 1301    PT Stop Time 1345    PT Time Calculation (min) 44 min    Equipment Utilized During Treatment Gait belt    Activity Tolerance Patient tolerated treatment well;Patient limited by fatigue    Behavior During Therapy WFL for tasks assessed/performed             Past Medical History:  Diagnosis Date   GERD (gastroesophageal reflux disease)    Hyperlipidemia    Hypertension    Obesity    Stroke [facility])     Past Surgical History:  Procedure Laterality Date   BRAIN SURGERY      There were no vitals filed for this visit.   Subjective Assessment - 02/14/21 1303     Subjective No falls or LOB since last session. Patient enjoyed his weekend with family    Pertinent History Patient is a pleasant 55 year old male who presents to physical therapy for weakness and immobility secondary to CVA.  Had a stroke in Feb 2018. He was previously fully independent, but this stroke caused severe residual deficits, mainly on the right side. He reports difficulty walking and decreased mobility. His mom had to move in with him and now is his main caregiver.  Patient had outpatient PT from November 2018-March 2022. Patient reports since being discharged from outpatient therapy he has kept up with his HEP daily. He does report desire for walking better and for getting in and out of bed easier. He reports his mom is helping with some ADLs. He  reports needing help with rolling in bed. He reports his mom/aide assist with dressing. He has a home aide 3 days out of the week;    Limitations Standing;Walking    How long can you sit comfortably? NA    How long can you stand comfortably? <30 min, does minimal standing;    How long can you walk comfortably? depends on the day, fatigue affects his walking; He is walking every day, about 15-20 min;    Diagnostic tests None recent    Patient Stated Goals to walk better and be independent in bed mobility;    Currently in Pain? No/denies    Pain Score 0-No pain              Treatment:  Ambulating with Bariatric RW and close CGA:  Navigate through crowded PT gym x70 ft into hall. 1x60 ft working on Best boy around [employer]. 1x72f working on alternating weaving between cones and completing 360 turn around cone.  -moderate UE reliance needed, but minimal episodes of toe drag noted compared to previous sessions. CGA<>min A from PT   Seated hamstring curls GTB x10 each side, cuing for maintaining upright sitting posture away from back of chair.  Seated Soccer [id number]'[id number] each side, cuing for maintaining upright sitting posture away from back of chair.   TherAc:  Toileting (void in standing) performed 1x at end of session with patient requiring assistance with donning doffing pants, stabilizing self, and washing of hands. Pt held onto grab bars for safety and stabilization.   Stand pivot transfer w/c<>chair. Difficulty with pivot steps, maneuvering RW (PT assisted) and foot clearance. PT directed for safe alignment to sitting surface.   Wheeling into/out of bathroom with focus on lip, requires assistance for entering bathroom, supervision only for exiting.    Pt educated throughout session about proper posture and technique with exercises. Improved exercise technique, movement at target joints, use of target muscles after min to mod verbal, visual, tactile  cues       Patient tolerated skilled PT well and gave great effort throughout. Patient demonstrated improved foot clearance and independent weight shift during gait today with only minimal episodes of toe drag noted as patient fatigued. Independent, upright sitting posture was challenged during TherEx progressions today and pt would benefit from continuing to promote thoracic extension and maintaining upright posture in both sitting and standing. Will continue to benefit from skilled PT to improve strength, balance, gait deviations, and increase safety and independence with ADLs.               PT Education - 02/14/21 1407     Education Details exercise technique    Person(s) Educated Patient    Methods Explanation;Demonstration;Tactile cues;Verbal cues    Comprehension Verbalized understanding;Returned demonstration;Verbal cues required;Tactile cues required              PT Short Term Goals - 01/22/21 1436       PT SHORT TERM GOAL #1   Title Patient will be adherent to HEP at least 3x a week to improve functional strength and balance for better safety at home.    Baseline 11/15/2020- Patient reports performing seated LE hip march, knee ext, sit to stand and walking daily and states no questions. Advised patient to practice static standing using his walker or kitchen counter several times per day without UE support.    Time 4    Period Weeks    Status Achieved    Target Date 11/01/20      PT SHORT TERM GOAL #2   Title Patient will be independent in bed mobility including sit to supine utilizing bed rails as needed for increased mobility at home.    Baseline 6/29: requires min A 8/24: patient reports needing mod A    Time 4    Period Weeks    Status On-going    Target Date 02/19/21               PT Long Term Goals - 01/22/21 1431       PT LONG TERM GOAL #1   Title Patient will improve FOTO score to > 50% to indicate improve functional mobility at home and  in community.    Baseline 6/29: 42% 8/24: 40% 10/17: 40%    Time 8    Period Weeks    Status On-going    Target Date 03/19/21      PT LONG TERM GOAL #2   Title Patient (< 93 years old) will complete five times sit to stand test in < 20 seconds indicating an increased LE strength and improved balance.    Baseline 6/29: 23.8 sec with 2HHA. 11/15/2020= 48 sec with BUE Support and CGA using gait belt. 8/24: 23 seconds with 2 HHA 10/17: 19.23 seconds with BUE support    Time 8  Period Weeks    Status Achieved      PT LONG TERM GOAL #3   Title Patient will increase 10 meter walk test to >0.15 m/s (60 sec or less) as to improve gait speed for better home ambulation and to reduce fall risk.    Baseline 6/29: 0.08 m/s with RW. 11/15/2020= 0.093 m/s with RW 8/24: 0.15 m/s    Time 8    Period Weeks    Status Achieved      PT LONG TERM GOAL #4   Title Patient will increase BLE gross strength to 4+/5 as to improve functional strength for independent gait, increased standing tolerance and increased ADL ability.    Baseline 6/29: see flowsheet. 8/24: hips grossly 4-/5 with hip extension 2+/5; L knee flexion 2+/5 knee extension 4/5; R knee 3+/5 10/17: see note    Time 8    Period Weeks    Status Partially Met    Target Date 03/19/21      PT LONG TERM GOAL #5   Title Patient will ambulate 250 ft with least assistive device and Supervision with no breaks to allow for increased mobility within home.    Baseline 11/15/2020-Ambulation distance = approx 110 feet using pediatric bariatric walker 8/24: 155 ft with bariatric RW 10/17: 170 ft with bariatric RW    Time 8    Period Weeks    Status Partially Met    Target Date 03/19/21      Additional Long Term Goals   Additional Long Term Goals Yes      PT LONG TERM GOAL #6   Title Patient will increase 10 meter walk test to >0.5 m/s ( 30 seconds) as to improve gait speed for better home ambulation and to reduce fall risk.    Baseline 8/24: 61 seconds:  0.15 m/s 10/17: 64 seconds ~0.15 m/s    Time 8    Period Weeks    Status Partially Met    Target Date 03/19/21      PT LONG TERM GOAL #7   Title Patient (< 60 years old) will complete five times sit to stand test in < 20 seconds with SUE support  indicating an increased LE strength and improved balance.    Baseline 10/17: 19.23 seconds with BUE support    Time 8    Period Weeks    Status New    Target Date 03/19/21                   Plan - 02/14/21 1408     Clinical Impression Statement Patient tolerated skilled PT well and gave great effort throughout. Patient demonstrated improved foot clearance and weight shift during gait today with only minimal episodes of toe drag noted as patient fatigued. Independent, upright sitting posture was challenged during TherEx progressions today and pt would benefit from continuing to promote thoracic extension and maintaining upright posture in both sitting and standing. Will continue to benefit from skilled PT to improve strength, balance, gait deviations, and increase safety and independence with ADLs.    Personal Factors and Comorbidities Comorbidity 3+;Education;Past/Current Experience;Time since onset of injury/illness/exacerbation;Transportation    Comorbidities HTN, arthritis, past CVA, high fall risk, obesity    Examination-Activity Limitations Bed Mobility;Locomotion Level;Squat;Stairs;Stand;Toileting;Transfers    Examination-Participation Restrictions Community Activity;Driving;Cleaning;Meal Prep;Shop;Volunteer;Yard Work    Stability/Clinical Decision Making Stable/Uncomplicated    Rehab Potential Good    PT Frequency 2x / week    PT Duration 8 weeks    PT Treatment/Interventions  ADLs/Self Care Home Management;Cryotherapy;Moist Heat;Gait training;Stair training;Functional mobility training;Therapeutic activities;Therapeutic exercise;Balance training;Neuromuscular re-education;Patient/family education;Orthotic Fit/Training;Passive range  of motion;Energy conservation    PT Next Visit Plan Continue with progressive ambulation and LE strengthening. Continue challenging dynamic and standing balance with reaching and internal perturbations.    PT Home Exercise Plan no updates  this session; Reminder to patient to continue with daily walking    Consulted and Agree with Plan of Care Patient             Patient will benefit from skilled therapeutic intervention in order to improve the following deficits and impairments:  Abnormal gait, Decreased balance, Decreased endurance, Decreased mobility, Difficulty walking, Hypomobility, Obesity, Improper body mechanics, Decreased activity tolerance, Decreased strength, Postural dysfunction  Visit Diagnosis: Abnormality of gait and mobility  Other lack of coordination     Problem List Patient Active Problem List   Diagnosis Date Noted   Impaired glucose tolerance 03/23/2020   Learning disability 03/23/2020   Urinary and fecal incontinence 06/14/2019   Chronic pain syndrome 03/23/2019   Constipation 03/23/2019   Dysarthria 03/23/2019   Dysphagia 03/23/2019   Learning difficulty 03/23/2019   Morbid obesity ([place]) 03/23/2019   Muscle weakness 03/23/2019   Vitamin D deficiency 03/23/2019   Bilateral carotid artery stenosis 12/15/2018   Moderate aortic valve stenosis 12/15/2018   BMI 45.0-49.9, adult ([place]) 11/10/2018   Cerebrovascular accident ([place]) 05/02/2016   Hemiparesis affecting right side as late effect of cerebrovascular accident ([place]) 05/01/2016   Brain tumor ([place]) 11/24/2014   Gastro-esophageal reflux disease without esophagitis 11/24/2014   Accident due to mechanical fall without injury 08/03/2014   Essential hypertension 06/13/2014   Mixed hyperlipidemia 06/13/2014   Unspecified sequelae of cerebral infarction 06/13/2014   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/2014   Type 2 diabetes mellitus without complication ([place]) 47/15/8063   Neck mass 09/08/2013   Swimmer's ear 09/08/2013    Erectile dysfunction 01/03/2013   Prediabetes 01/03/2013   Arsenio Katz, SPT  This entire session was performed under direct supervision and direction of a licensed therapist/therapist assistant . I have personally read, edited and approve of the note as written.  Janna Arch, PT, DPT  02/14/2021, 2:12 PM  [place] MAIN [facility] SERVICES [address] [place], [place], [address] Phone: [phone]   Fax:  [phone]  Name: AAIDEN DEPOY MRN: [id number] Date of Birth: 02-26-1966

## 2021-02-19 ENCOUNTER — Ambulatory Visit: Payer: Medicare Other

## 2021-02-19 ENCOUNTER — Other Ambulatory Visit: Payer: Self-pay

## 2021-02-19 DIAGNOSIS — R269 Unspecified abnormalities of gait and mobility: Secondary | ICD-10-CM

## 2021-02-19 DIAGNOSIS — R278 Other lack of coordination: Secondary | ICD-10-CM

## 2021-02-19 DIAGNOSIS — M6281 Muscle weakness (generalized): Secondary | ICD-10-CM

## 2021-02-19 NOTE — Therapy (Signed)
[place] MAIN [facility] SERVICES [address] [place], [place], [address] Phone: [phone]   Fax:  [phone]  Physical Therapy Treatment  Patient Details  Name: Curtis Powell MRN: [id number] Date of Birth: 10-11-1965 Referring Provider (PT): Kym Groom MD   Encounter Date: 02/19/2021   PT End of Session - 02/19/21 1545     Visit Number 33    Number of Visits 42    Date for PT Re-Evaluation 03/19/21    Authorization Type UHC Medicare; [place] Medicaid    Authorization Time Period next session 1/10 10/31    PT Start Time 1352    PT Stop Time 1430    PT Time Calculation (min) 38 min    Equipment Utilized During Treatment Gait belt    Activity Tolerance Patient tolerated treatment well;Patient limited by fatigue    Behavior During Therapy WFL for tasks assessed/performed             Past Medical History:  Diagnosis Date   GERD (gastroesophageal reflux disease)    Hyperlipidemia    Hypertension    Obesity    Stroke [facility])     Past Surgical History:  Procedure Laterality Date   BRAIN SURGERY      There were no vitals filed for this visit.   Subjective Assessment - 02/19/21 1355     Subjective Patient in pleasant mood. No falls or LOB since last session.    Pertinent History Patient is a pleasant 55 year old male who presents to physical therapy for weakness and immobility secondary to CVA.  Had a stroke in Feb 2018. He was previously fully independent, but this stroke caused severe residual deficits, mainly on the right side. He reports difficulty walking and decreased mobility. His mom had to move in with him and now is his main caregiver.  Patient had outpatient PT from November 2018-March 2022. Patient reports since being discharged from outpatient therapy he has kept up with his HEP daily. He does report desire for walking better and for getting in and out of bed easier. He reports his mom is helping with some ADLs. He reports needing  help with rolling in bed. He reports his mom/aide assist with dressing. He has a home aide 3 days out of the week;    Limitations Standing;Walking    How long can you sit comfortably? NA    How long can you stand comfortably? <30 min, does minimal standing;    How long can you walk comfortably? depends on the day, fatigue affects his walking; He is walking every day, about 15-20 min;    Diagnostic tests None recent    Patient Stated Goals to walk better and be independent in bed mobility;    Currently in Pain? No/denies             TherEx:  Ambulation for endurance 1x100 ft, 1x85 ft. CGA only with gait belt, no tactile cuing needed for hip flexion/weight shift this session. Significantly improved foot clearance and weight shift noted.   Seated LAQs with 3# ankle weights 1x12 each LE Seated Marches with 3# ankle weights, cuing for high march and adduction to tap hedgehog. 1x8 each LE Seated hip adduction with ball squeeze with 3sec hold x10   Ther Ac:  Toileting (void in standing) performed 1x at end of session with patient requiring assistance with donning doffing pants, stabilizing self, and washing of hands. Pt held onto grab bars for safety and stabilization.  Stand pivot transfer w/c<>chair. Difficulty with pivot steps, maneuvering RW (PT assisted) and foot clearance. PT directed for safe alignment to sitting surface.   Wheeling into/out of bathroom with focus on lip, requires assistance for entering bathroom, supervision only for exiting.     Pt educated throughout session about proper posture and technique with exercises. Improved exercise technique, movement at target joints, use of target muscles after min to mod verbal, visual, tactile cues.     Patient demonstrated significant improvement during ambulation today; requiring only CGA assist throughout and only minimal instances of toe drag. He gave great effort throughout session and tolerated increased ambulation and  strengthening well. Patient will benefit from incorporating more standing balance and single limb stance training. Will continue to benefit from skilled PT to improve gait mechanics, speed, strengthening, and stability to promote independence and safety with ADLs.           PT Education - 02/19/21 1421     Education Details exercise technique    Person(s) Educated Patient    Methods Explanation;Demonstration;Verbal cues    Comprehension Verbalized understanding;Returned demonstration;Verbal cues required              PT Short Term Goals - 01/22/21 1436       PT SHORT TERM GOAL #1   Title Patient will be adherent to HEP at least 3x a week to improve functional strength and balance for better safety at home.    Baseline 11/15/2020- Patient reports performing seated LE hip march, knee ext, sit to stand and walking daily and states no questions. Advised patient to practice static standing using his walker or kitchen counter several times per day without UE support.    Time 4    Period Weeks    Status Achieved    Target Date 11/01/20      PT SHORT TERM GOAL #2   Title Patient will be independent in bed mobility including sit to supine utilizing bed rails as needed for increased mobility at home.    Baseline 6/29: requires min A 8/24: patient reports needing mod A    Time 4    Period Weeks    Status On-going    Target Date 02/19/21               PT Long Term Goals - 01/22/21 1431       PT LONG TERM GOAL #1   Title Patient will improve FOTO score to > 50% to indicate improve functional mobility at home and in community.    Baseline 6/29: 42% 8/24: 40% 10/17: 40%    Time 8    Period Weeks    Status On-going    Target Date 03/19/21      PT LONG TERM GOAL #2   Title Patient (< 21 years old) will complete five times sit to stand test in < 20 seconds indicating an increased LE strength and improved balance.    Baseline 6/29: 23.8 sec with 2HHA. 11/15/2020= 48 sec with  BUE Support and CGA using gait belt. 8/24: 23 seconds with 2 HHA 10/17: 19.23 seconds with BUE support    Time 8    Period Weeks    Status Achieved      PT LONG TERM GOAL #3   Title Patient will increase 10 meter walk test to >0.15 m/s (60 sec or less) as to improve gait speed for better home ambulation and to reduce fall risk.    Baseline 6/29: 0.08 m/s with  RW. 11/15/2020= 0.093 m/s with RW 8/24: 0.15 m/s    Time 8    Period Weeks    Status Achieved      PT LONG TERM GOAL #4   Title Patient will increase BLE gross strength to 4+/5 as to improve functional strength for independent gait, increased standing tolerance and increased ADL ability.    Baseline 6/29: see flowsheet. 8/24: hips grossly 4-/5 with hip extension 2+/5; L knee flexion 2+/5 knee extension 4/5; R knee 3+/5 10/17: see note    Time 8    Period Weeks    Status Partially Met    Target Date 03/19/21      PT LONG TERM GOAL #5   Title Patient will ambulate 250 ft with least assistive device and Supervision with no breaks to allow for increased mobility within home.    Baseline 11/15/2020-Ambulation distance = approx 110 feet using pediatric bariatric walker 8/24: 155 ft with bariatric RW 10/17: 170 ft with bariatric RW    Time 8    Period Weeks    Status Partially Met    Target Date 03/19/21      Additional Long Term Goals   Additional Long Term Goals Yes      PT LONG TERM GOAL #6   Title Patient will increase 10 meter walk test to >0.5 m/s ( 30 seconds) as to improve gait speed for better home ambulation and to reduce fall risk.    Baseline 8/24: 61 seconds: 0.15 m/s 10/17: 64 seconds ~0.15 m/s    Time 8    Period Weeks    Status Partially Met    Target Date 03/19/21      PT LONG TERM GOAL #7   Title Patient (< 53 years old) will complete five times sit to stand test in < 20 seconds with SUE support  indicating an increased LE strength and improved balance.    Baseline 10/17: 19.23 seconds with BUE support    Time  8    Period Weeks    Status New    Target Date 03/19/21                   Plan - 02/19/21 1546     Clinical Impression Statement Patient demonstrated significant improvement during ambulation today; requiring only CGA assist throughout and only minimal instances of toe drag. He gave great effort throughout session and tolerated increased ambulation and strengthening well. Patient will benefit from incorporating more standing balance and single limb stance training. Will continue to benefit from skilled PT to improve gait mechanics, speed, strengthening, and stability to promote independence and safety with ADLs.    Personal Factors and Comorbidities Comorbidity 3+;Education;Past/Current Experience;Time since onset of injury/illness/exacerbation;Transportation    Comorbidities HTN, arthritis, past CVA, high fall risk, obesity    Examination-Activity Limitations Bed Mobility;Locomotion Level;Squat;Stairs;Stand;Toileting;Transfers    Examination-Participation Restrictions Community Activity;Driving;Cleaning;Meal Prep;Shop;Volunteer;Yard Work    Stability/Clinical Decision Making Stable/Uncomplicated    Rehab Potential Good    PT Frequency 2x / week    PT Duration 8 weeks    PT Treatment/Interventions ADLs/Self Care Home Management;Cryotherapy;Moist Heat;Gait training;Stair training;Functional mobility training;Therapeutic activities;Therapeutic exercise;Balance training;Neuromuscular re-education;Patient/family education;Orthotic Fit/Training;Passive range of motion;Energy conservation    PT Next Visit Plan Continue with progressive ambulation and LE strengthening. Continue challenging dynamic and standing balance with reaching and internal perturbations.    PT Home Exercise Plan no updates  this session; Reminder to patient to continue with daily walking    Consulted and Agree  with Plan of Care Patient             Patient will benefit from skilled therapeutic intervention in order  to improve the following deficits and impairments:  Abnormal gait, Decreased balance, Decreased endurance, Decreased mobility, Difficulty walking, Hypomobility, Obesity, Improper body mechanics, Decreased activity tolerance, Decreased strength, Postural dysfunction  Visit Diagnosis: Muscle weakness (generalized)  Abnormality of gait and mobility  Other lack of coordination     Problem List Patient Active Problem List   Diagnosis Date Noted   Impaired glucose tolerance 03/23/2020   Learning disability 03/23/2020   Urinary and fecal incontinence 06/14/2019   Chronic pain syndrome 03/23/2019   Constipation 03/23/2019   Dysarthria 03/23/2019   Dysphagia 03/23/2019   Learning difficulty 03/23/2019   Morbid obesity ([place]) 03/23/2019   Muscle weakness 03/23/2019   Vitamin D deficiency 03/23/2019   Bilateral carotid artery stenosis 12/15/2018   Moderate aortic valve stenosis 12/15/2018   BMI 45.0-49.9, adult ([place]) 11/10/2018   Cerebrovascular accident ([place]) 05/02/2016   Hemiparesis affecting right side as late effect of cerebrovascular accident ([place]) 05/01/2016   Brain tumor ([place]) 11/24/2014   Gastro-esophageal reflux disease without esophagitis 11/24/2014   Accident due to mechanical fall without injury 08/03/2014   Essential hypertension 06/13/2014   Mixed hyperlipidemia 06/13/2014   Unspecified sequelae of cerebral infarction 06/13/2014   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/2014   Type 2 diabetes mellitus without complication ([place]) 65/99/3570   Neck mass 09/08/2013   Swimmer's ear 09/08/2013   Erectile dysfunction 01/03/2013   Prediabetes 01/03/2013   Arsenio Katz, SPT  This entire session was performed under direct supervision and direction of a licensed therapist/therapist assistant . I have personally read, edited and approve of the note as written.  Janna Arch, PT, DPT  02/19/2021, 4:07 PM  [place] MAIN [facility] SERVICES [address]  [place], [place], [address] Phone: [phone]   Fax:  [phone]  Name: Curtis Powell MRN: [id number] Date of Birth: 09/25/1965

## 2021-02-21 ENCOUNTER — Other Ambulatory Visit: Payer: Self-pay

## 2021-02-21 ENCOUNTER — Ambulatory Visit: Payer: Medicare Other

## 2021-02-21 DIAGNOSIS — R2681 Unsteadiness on feet: Secondary | ICD-10-CM

## 2021-02-21 DIAGNOSIS — R269 Unspecified abnormalities of gait and mobility: Secondary | ICD-10-CM | POA: Diagnosis not present

## 2021-02-21 DIAGNOSIS — M6281 Muscle weakness (generalized): Secondary | ICD-10-CM

## 2021-02-21 NOTE — Therapy (Signed)
[place] MAIN [facility] SERVICES [address] [place], [place], [address] Phone: [phone]   Fax:  [phone]  Physical Therapy Treatment  Patient Details  Name: Curtis Powell MRN: [id number] Date of Birth: Nov 20, 1965 Referring Provider (PT): Kym Groom MD   Encounter Date: 02/21/2021   PT End of Session - 02/21/21 1324     Visit Number 34    Number of Visits 42    Date for PT Re-Evaluation 03/19/21    Authorization Type UHC Medicare; [place] Medicaid    Authorization Time Period next session 1/10 10/31    PT Start Time 1305    PT Stop Time 1345    PT Time Calculation (min) 40 min    Equipment Utilized During Treatment Gait belt    Activity Tolerance Patient tolerated treatment well;Patient limited by fatigue    Behavior During Therapy WFL for tasks assessed/performed             Past Medical History:  Diagnosis Date   GERD (gastroesophageal reflux disease)    Hyperlipidemia    Hypertension    Obesity    Stroke [facility])     Past Surgical History:  Procedure Laterality Date   BRAIN SURGERY      There were no vitals filed for this visit.   Subjective Assessment - 02/21/21 1435     Subjective Patient reports no changes, falls, or LOB since last session.    Pertinent History Patient is a pleasant 55 year old male who presents to physical therapy for weakness and immobility secondary to CVA.  Had a stroke in Feb 2018. He was previously fully independent, but this stroke caused severe residual deficits, mainly on the right side. He reports difficulty walking and decreased mobility. His mom had to move in with him and now is his main caregiver.  Patient had outpatient PT from November 2018-March 2022. Patient reports since being discharged from outpatient therapy he has kept up with his HEP daily. He does report desire for walking better and for getting in and out of bed easier. He reports his mom is helping with some ADLs. He reports needing  help with rolling in bed. He reports his mom/aide assist with dressing. He has a home aide 3 days out of the week;    Limitations Standing;Walking    How long can you sit comfortably? NA    How long can you stand comfortably? <30 min, does minimal standing;    How long can you walk comfortably? depends on the day, fatigue affects his walking; He is walking every day, about 15-20 min;    Diagnostic tests None recent    Patient Stated Goals to walk better and be independent in bed mobility;    Currently in Pain? No/denies    Pain Score 0-No pain            Neuro Re-Ed  All standing at Pediatric Bariatric RW with close CGA:  Static standing 2x30sec, cuing for upright posture and lessening UE support.  Single leg toe taps to 6" step, 1x6 each LE, regressed to 4" step and performed 1x8 each LE. Static standing, no UE support with air punching 2x30 sec Standing soccer ball kicks, 2x10 each LE  Seated LAQs with 3# ankle weights 1x12 each LE, cuing for maintaining upright posture away from back of chair. Seated Marches with 3# ankle weights, cuing for high march and adduction to tap hedgehog. 1x8 each LE, repeated without ankle weights 1x8 each LE.  Seated Thoracic extensions 2x12, cuing for hips back in WC   Ambulated x60 ft in gym with bariatric pediatric RW, weaving around obstacles, cuing for upright posture. CGA<>min A at glutes for weight shift needed only towards end.  Therapeutic Activities:  Toileting (voiding in sitting) performed 1x at beginning of session with patient requiring assistance with donning doffing pants. Pt held onto grab bars for safety and stabilization but performed stand pivot transfer from [facility] to commode with stand-by assist.   Stand pivot transfer w/c<>chair and sit to stand from commode performed independently with UE support at grab bars. Mod A only needed to don/doff pants.  Wheeling into/out of bathroom with focus on lip, requires assistance for entering  bathroom, supervision only for exiting.        Pt educated throughout session about proper posture and technique with exercises. Improved exercise technique, movement at target joints, use of target muscles after min to mod verbal, visual, tactile cues.      Patient presents to PT with no reports of pain and gave good effort throughout . Standing balance was progressed today and was tolerated well by pt who was able to perform static standing with no UE support for increased time compared to previous sessions. Less assist from SPT was needed for toileting therapeutic activity as well. Will continue to benefit from skilled PT to increase strength, balance, and decrease gait deviations in order to improve functional mobility and independence with ADLs.            PT Education - 02/21/21 1535     Education Details Exercise Technique    Person(s) Educated Patient    Methods Explanation;Demonstration;Verbal cues;Tactile cues    Comprehension Verbalized understanding;Returned demonstration;Verbal cues required;Tactile cues required              PT Short Term Goals - 01/22/21 1436       PT SHORT TERM GOAL #1   Title Patient will be adherent to HEP at least 3x a week to improve functional strength and balance for better safety at home.    Baseline 11/15/2020- Patient reports performing seated LE hip march, knee ext, sit to stand and walking daily and states no questions. Advised patient to practice static standing using his walker or kitchen counter several times per day without UE support.    Time 4    Period Weeks    Status Achieved    Target Date 11/01/20      PT SHORT TERM GOAL #2   Title Patient will be independent in bed mobility including sit to supine utilizing bed rails as needed for increased mobility at home.    Baseline 6/29: requires min A 8/24: patient reports needing mod A    Time 4    Period Weeks    Status On-going    Target Date 02/19/21                PT Long Term Goals - 01/22/21 1431       PT LONG TERM GOAL #1   Title Patient will improve FOTO score to > 50% to indicate improve functional mobility at home and in community.    Baseline 6/29: 42% 8/24: 40% 10/17: 40%    Time 8    Period Weeks    Status On-going    Target Date 03/19/21      PT LONG TERM GOAL #2   Title Patient (< 4 years old) will complete five times sit to stand test in <  20 seconds indicating an increased LE strength and improved balance.    Baseline 6/29: 23.8 sec with 2HHA. 11/15/2020= 48 sec with BUE Support and CGA using gait belt. 8/24: 23 seconds with 2 HHA 10/17: 19.23 seconds with BUE support    Time 8    Period Weeks    Status Achieved      PT LONG TERM GOAL #3   Title Patient will increase 10 meter walk test to >0.15 m/s (60 sec or less) as to improve gait speed for better home ambulation and to reduce fall risk.    Baseline 6/29: 0.08 m/s with RW. 11/15/2020= 0.093 m/s with RW 8/24: 0.15 m/s    Time 8    Period Weeks    Status Achieved      PT LONG TERM GOAL #4   Title Patient will increase BLE gross strength to 4+/5 as to improve functional strength for independent gait, increased standing tolerance and increased ADL ability.    Baseline 6/29: see flowsheet. 8/24: hips grossly 4-/5 with hip extension 2+/5; L knee flexion 2+/5 knee extension 4/5; R knee 3+/5 10/17: see note    Time 8    Period Weeks    Status Partially Met    Target Date 03/19/21      PT LONG TERM GOAL #5   Title Patient will ambulate 250 ft with least assistive device and Supervision with no breaks to allow for increased mobility within home.    Baseline 11/15/2020-Ambulation distance = approx 110 feet using pediatric bariatric walker 8/24: 155 ft with bariatric RW 10/17: 170 ft with bariatric RW    Time 8    Period Weeks    Status Partially Met    Target Date 03/19/21      Additional Long Term Goals   Additional Long Term Goals Yes      PT LONG TERM GOAL #6   Title  Patient will increase 10 meter walk test to >0.5 m/s ( 30 seconds) as to improve gait speed for better home ambulation and to reduce fall risk.    Baseline 8/24: 61 seconds: 0.15 m/s 10/17: 64 seconds ~0.15 m/s    Time 8    Period Weeks    Status Partially Met    Target Date 03/19/21      PT LONG TERM GOAL #7   Title Patient (< 11 years old) will complete five times sit to stand test in < 20 seconds with SUE support  indicating an increased LE strength and improved balance.    Baseline 10/17: 19.23 seconds with BUE support    Time 8    Period Weeks    Status New    Target Date 03/19/21                   Plan - 02/21/21 1423     Clinical Impression Statement Patient presents to PT with no reports of pain and gave good effort throughout . Standing balance was progressed today and was tolerated well by pt who was able to perform static standing with no UE support for increased time compared to previous sessions. Less assist from SPT was needed for toileting therapeutic activity as well. Will continue to benefit from skilled PT to increase strength, balance, and decrease gait deviations in order to improve functional mobility and independence with ADLs.    Personal Factors and Comorbidities Comorbidity 3+;Education;Past/Current Experience;Time since onset of injury/illness/exacerbation;Transportation    Comorbidities HTN, arthritis, past CVA, high fall risk, obesity  Examination-Activity Limitations Bed Mobility;Locomotion Level;Squat;Stairs;Stand;Toileting;Transfers    Examination-Participation Restrictions Community Activity;Driving;Cleaning;Meal Prep;Shop;Volunteer;Yard Work    Stability/Clinical Decision Making Stable/Uncomplicated    Rehab Potential Good    PT Frequency 2x / week    PT Duration 8 weeks    PT Treatment/Interventions ADLs/Self Care Home Management;Cryotherapy;Moist Heat;Gait training;Stair training;Functional mobility training;Therapeutic activities;Therapeutic  exercise;Balance training;Neuromuscular re-education;Patient/family education;Orthotic Fit/Training;Passive range of motion;Energy conservation    PT Next Visit Plan Continue with progressive ambulation and LE strengthening. Continue challenging dynamic and standing balance with reaching and internal perturbations.    PT Home Exercise Plan no updates  this session; Reminder to patient to continue with daily walking    Consulted and Agree with Plan of Care Patient             Patient will benefit from skilled therapeutic intervention in order to improve the following deficits and impairments:  Abnormal gait, Decreased balance, Decreased endurance, Decreased mobility, Difficulty walking, Hypomobility, Obesity, Improper body mechanics, Decreased activity tolerance, Decreased strength, Postural dysfunction  Visit Diagnosis: Abnormality of gait and mobility  Unsteadiness on feet  Muscle weakness (generalized)     Problem List Patient Active Problem List   Diagnosis Date Noted   Impaired glucose tolerance 03/23/2020   Learning disability 03/23/2020   Urinary and fecal incontinence 06/14/2019   Chronic pain syndrome 03/23/2019   Constipation 03/23/2019   Dysarthria 03/23/2019   Dysphagia 03/23/2019   Learning difficulty 03/23/2019   Morbid obesity ([place]) 03/23/2019   Muscle weakness 03/23/2019   Vitamin D deficiency 03/23/2019   Bilateral carotid artery stenosis 12/15/2018   Moderate aortic valve stenosis 12/15/2018   BMI 45.0-49.9, adult ([place]) 11/10/2018   Cerebrovascular accident ([place]) 05/02/2016   Hemiparesis affecting right side as late effect of cerebrovascular accident ([place]) 05/01/2016   Brain tumor ([place]) 11/24/2014   Gastro-esophageal reflux disease without esophagitis 11/24/2014   Accident due to mechanical fall without injury 08/03/2014   Essential hypertension 06/13/2014   Mixed hyperlipidemia 06/13/2014   Unspecified sequelae of cerebral infarction 06/13/2014    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/2014   Type 2 diabetes mellitus without complication ([place]) 22/05/5425   Neck mass 09/08/2013   Swimmer's ear 09/08/2013   Erectile dysfunction 01/03/2013   Prediabetes 01/03/2013   Arsenio Katz, SPT   This entire session was performed under direct supervision and direction of a licensed therapist/therapist assistant . I have personally read, edited and approve of the note as written.  Janna Arch, PT, DPT  02/21/2021, 3:39 PM  [place] MAIN [facility] SERVICES [address] [place], [place], [address] Phone: [phone]   Fax:  [phone]  Name: Curtis Powell MRN: [id number] Date of Birth: Aug 16, 1965

## 2021-02-26 ENCOUNTER — Other Ambulatory Visit: Payer: Self-pay

## 2021-02-26 ENCOUNTER — Ambulatory Visit: Payer: Medicare Other

## 2021-02-26 DIAGNOSIS — M6281 Muscle weakness (generalized): Secondary | ICD-10-CM

## 2021-02-26 DIAGNOSIS — R269 Unspecified abnormalities of gait and mobility: Secondary | ICD-10-CM | POA: Diagnosis not present

## 2021-02-26 DIAGNOSIS — R2681 Unsteadiness on feet: Secondary | ICD-10-CM

## 2021-02-26 NOTE — Therapy (Signed)
[place] MAIN [facility] SERVICES [address] [place], [place], [address] Phone: [phone]   Fax:  [phone]  Physical Therapy Treatment  Patient Details  Name: Curtis Powell MRN: [id number] Date of Birth: 1966-03-02 Referring Provider (PT): Kym Groom MD   Encounter Date: 02/26/2021   PT End of Session - 02/26/21 1410     Visit Number 35    Number of Visits 42    Date for PT Re-Evaluation 03/19/21    Authorization Type UHC Medicare; [place] Medicaid    Authorization Time Period 5/10 10/31    PT Start Time 1345    PT Stop Time 1430    PT Time Calculation (min) 45 min    Equipment Utilized During Treatment Gait belt    Activity Tolerance Patient tolerated treatment well;Patient limited by fatigue    Behavior During Therapy WFL for tasks assessed/performed             Past Medical History:  Diagnosis Date   GERD (gastroesophageal reflux disease)    Hyperlipidemia    Hypertension    Obesity    Stroke [facility])     Past Surgical History:  Procedure Laterality Date   BRAIN SURGERY      There were no vitals filed for this visit.   Subjective Assessment - 02/26/21 1347     Subjective Patient reports watching football. No falls or LOB since last session.    Pertinent History Patient is a pleasant 55 year old male who presents to physical therapy for weakness and immobility secondary to CVA.  Had a stroke in Feb 2018. He was previously fully independent, but this stroke caused severe residual deficits, mainly on the right side. He reports difficulty walking and decreased mobility. His mom had to move in with him and now is his main caregiver.  Patient had outpatient PT from November 2018-March 2022. Patient reports since being discharged from outpatient therapy he has kept up with his HEP daily. He does report desire for walking better and for getting in and out of bed easier. He reports his mom is helping with some ADLs. He reports needing  help with rolling in bed. He reports his mom/aide assist with dressing. He has a home aide 3 days out of the week;    Limitations Standing;Walking    How long can you sit comfortably? NA    How long can you stand comfortably? <30 min, does minimal standing;    How long can you walk comfortably? depends on the day, fatigue affects his walking; He is walking every day, about 15-20 min;    Diagnostic tests None recent    Patient Stated Goals to walk better and be independent in bed mobility;    Currently in Pain? No/denies                     Therapeutic Ex:   Ambulate 160 ft with bariatric RW pediatric size Tactile cuing for hip extension and glute activation during stance to promote foot clearance and weight shift. Patient with improved gait pattern compared to previous sessions; toe drag and weight shift only challenged     Standing in // bars: seated rest break between interventions  Side step 2x length of // bars cues for upright posture Hip flexion into RTB across bars 15x each LE   Ambulate forwards/backwards 4x length of // bars cue for upright posture, hand placement and step length       Therapeutic Activities:  Toileting (void in standing) performed 1x at end of session with patient requiring assistance with donning doffing pants, stabilizing self, and washing of hands. Pt held onto grab bars for safety and stabilization.   Stand pivot transfer w/c<>chair. Difficulty with pivot steps, maneuvering RW (PT assisted) and foot clearance. PT directed for safe alignment to sitting surface.   Wheeling into/out of bathroom with focus on lip, requires assistance for entering bathroom, supervision only for exiting.    Pt educated throughout session about proper posture and technique with exercises. Improved exercise technique, movement at target joints, use of target muscles after min to mod verbal, visual, tactile cues     Patient tolerated progressive standing  interventions with occasional episodes of bilateral knee buckling with fatigue. Decreased upright posture noted with prolonged standing and ambulation. He is able to take increased amplitude of step length backwards. Patient will continue to benefit from skilled PT to improve strength, funcitonal mobility, balance, and independence with ADLs                  PT Education - 02/26/21 1409     Education Details exercise technique, body mechanics    Person(s) Educated Patient    Methods Explanation;Demonstration;Tactile cues;Verbal cues    Comprehension Verbalized understanding;Returned demonstration;Verbal cues required;Tactile cues required              PT Short Term Goals - 01/22/21 1436       PT SHORT TERM GOAL #1   Title Patient will be adherent to HEP at least 3x a week to improve functional strength and balance for better safety at home.    Baseline 11/15/2020- Patient reports performing seated LE hip march, knee ext, sit to stand and walking daily and states no questions. Advised patient to practice static standing using his walker or kitchen counter several times per day without UE support.    Time 4    Period Weeks    Status Achieved    Target Date 11/01/20      PT SHORT TERM GOAL #2   Title Patient will be independent in bed mobility including sit to supine utilizing bed rails as needed for increased mobility at home.    Baseline 6/29: requires min A 8/24: patient reports needing mod A    Time 4    Period Weeks    Status On-going    Target Date 02/19/21               PT Long Term Goals - 01/22/21 1431       PT LONG TERM GOAL #1   Title Patient will improve FOTO score to > 50% to indicate improve functional mobility at home and in community.    Baseline 6/29: 42% 8/24: 40% 10/17: 40%    Time 8    Period Weeks    Status On-going    Target Date 03/19/21      PT LONG TERM GOAL #2   Title Patient (< 81 years old) will complete five times sit to  stand test in < 20 seconds indicating an increased LE strength and improved balance.    Baseline 6/29: 23.8 sec with 2HHA. 11/15/2020= 48 sec with BUE Support and CGA using gait belt. 8/24: 23 seconds with 2 HHA 10/17: 19.23 seconds with BUE support    Time 8    Period Weeks    Status Achieved      PT LONG TERM GOAL #3   Title Patient will increase 10 meter  walk test to >0.15 m/s (60 sec or less) as to improve gait speed for better home ambulation and to reduce fall risk.    Baseline 6/29: 0.08 m/s with RW. 11/15/2020= 0.093 m/s with RW 8/24: 0.15 m/s    Time 8    Period Weeks    Status Achieved      PT LONG TERM GOAL #4   Title Patient will increase BLE gross strength to 4+/5 as to improve functional strength for independent gait, increased standing tolerance and increased ADL ability.    Baseline 6/29: see flowsheet. 8/24: hips grossly 4-/5 with hip extension 2+/5; L knee flexion 2+/5 knee extension 4/5; R knee 3+/5 10/17: see note    Time 8    Period Weeks    Status Partially Met    Target Date 03/19/21      PT LONG TERM GOAL #5   Title Patient will ambulate 250 ft with least assistive device and Supervision with no breaks to allow for increased mobility within home.    Baseline 11/15/2020-Ambulation distance = approx 110 feet using pediatric bariatric walker 8/24: 155 ft with bariatric RW 10/17: 170 ft with bariatric RW    Time 8    Period Weeks    Status Partially Met    Target Date 03/19/21      Additional Long Term Goals   Additional Long Term Goals Yes      PT LONG TERM GOAL #6   Title Patient will increase 10 meter walk test to >0.5 m/s ( 30 seconds) as to improve gait speed for better home ambulation and to reduce fall risk.    Baseline 8/24: 61 seconds: 0.15 m/s 10/17: 64 seconds ~0.15 m/s    Time 8    Period Weeks    Status Partially Met    Target Date 03/19/21      PT LONG TERM GOAL #7   Title Patient (< 47 years old) will complete five times sit to stand test in <  20 seconds with SUE support  indicating an increased LE strength and improved balance.    Baseline 10/17: 19.23 seconds with BUE support    Time 8    Period Weeks    Status New    Target Date 03/19/21                   Plan - 02/26/21 1440     Clinical Impression Statement Patient tolerated progressive standing interventions with occasional episodes of bilateral knee buckling with fatigue. Decreased upright posture noted with prolonged standing and ambulation. He is able to take increased amplitude of step length backwards. Patient will continue to benefit from skilled PT to improve strength, funcitonal mobility, balance, and independence with ADLs    Personal Factors and Comorbidities Comorbidity 3+;Education;Past/Current Experience;Time since onset of injury/illness/exacerbation;Transportation    Comorbidities HTN, arthritis, past CVA, high fall risk, obesity    Examination-Activity Limitations Bed Mobility;Locomotion Level;Squat;Stairs;Stand;Toileting;Transfers    Examination-Participation Restrictions Community Activity;Driving;Cleaning;Meal Prep;Shop;Volunteer;Yard Work    Stability/Clinical Decision Making Stable/Uncomplicated    Rehab Potential Good    PT Frequency 2x / week    PT Duration 8 weeks    PT Treatment/Interventions ADLs/Self Care Home Management;Cryotherapy;Moist Heat;Gait training;Stair training;Functional mobility training;Therapeutic activities;Therapeutic exercise;Balance training;Neuromuscular re-education;Patient/family education;Orthotic Fit/Training;Passive range of motion;Energy conservation    PT Next Visit Plan Continue with progressive ambulation and LE strengthening. Continue challenging dynamic and standing balance with reaching and internal perturbations.    PT Home Exercise Plan no updates  this session; Reminder to patient to continue with daily walking    Consulted and Agree with Plan of Care Patient             Patient will benefit from  skilled therapeutic intervention in order to improve the following deficits and impairments:  Abnormal gait, Decreased balance, Decreased endurance, Decreased mobility, Difficulty walking, Hypomobility, Obesity, Improper body mechanics, Decreased activity tolerance, Decreased strength, Postural dysfunction  Visit Diagnosis: Abnormality of gait and mobility  Unsteadiness on feet  Muscle weakness (generalized)     Problem List Patient Active Problem List   Diagnosis Date Noted   Impaired glucose tolerance 03/23/2020   Learning disability 03/23/2020   Urinary and fecal incontinence 06/14/2019   Chronic pain syndrome 03/23/2019   Constipation 03/23/2019   Dysarthria 03/23/2019   Dysphagia 03/23/2019   Learning difficulty 03/23/2019   Morbid obesity ([place]) 03/23/2019   Muscle weakness 03/23/2019   Vitamin D deficiency 03/23/2019   Bilateral carotid artery stenosis 12/15/2018   Moderate aortic valve stenosis 12/15/2018   BMI 45.0-49.9, adult ([place]) 11/10/2018   Cerebrovascular accident ([place]) 05/02/2016   Hemiparesis affecting right side as late effect of cerebrovascular accident ([place]) 05/01/2016   Brain tumor ([place]) 11/24/2014   Gastro-esophageal reflux disease without esophagitis 11/24/2014   Accident due to mechanical fall without injury 08/03/2014   Essential hypertension 06/13/2014   Mixed hyperlipidemia 06/13/2014   Unspecified sequelae of cerebral infarction 06/13/2014   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/2014   Type 2 diabetes mellitus without complication ([place]) 39/76/7341   Neck mass 09/08/2013   Swimmer's ear 09/08/2013   Erectile dysfunction 01/03/2013   Prediabetes 01/03/2013   Janna Arch, PT, DPT  02/26/2021, 2:42 PM  [place] [facility] MAIN [facility] SERVICES [address] [place], [place], [address] Phone: [phone]   Fax:  [phone]  Name: DAMASO LADAY MRN: [id number] Date of Birth: 1965-07-15

## 2021-02-28 ENCOUNTER — Other Ambulatory Visit: Payer: Self-pay

## 2021-02-28 ENCOUNTER — Ambulatory Visit: Payer: Medicare Other

## 2021-02-28 DIAGNOSIS — R278 Other lack of coordination: Secondary | ICD-10-CM

## 2021-02-28 DIAGNOSIS — R262 Difficulty in walking, not elsewhere classified: Secondary | ICD-10-CM

## 2021-02-28 DIAGNOSIS — R269 Unspecified abnormalities of gait and mobility: Secondary | ICD-10-CM | POA: Diagnosis not present

## 2021-02-28 NOTE — Therapy (Signed)
[place] MAIN [facility] SERVICES [address] [place], [place], [address] Phone: [phone]   Fax:  [phone]  Physical Therapy Treatment  Patient Details  Name: Curtis Powell MRN: [id number] Date of Birth: 1966/01/26 Referring Provider (PT): Kym Groom MD   Encounter Date: 02/28/2021   PT End of Session - 02/28/21 1303     Visit Number 36    Number of Visits 42    Date for PT Re-Evaluation 03/19/21    Authorization Type UHC Medicare; [place] Medicaid    Authorization Time Period 5/10 10/31    PT Start Time 48    PT Stop Time 1345    PT Time Calculation (min) 45 min    Equipment Utilized During Treatment Gait belt    Activity Tolerance Patient tolerated treatment well;Patient limited by fatigue    Behavior During Therapy WFL for tasks assessed/performed             Past Medical History:  Diagnosis Date   GERD (gastroesophageal reflux disease)    Hyperlipidemia    Hypertension    Obesity    Stroke [facility])     Past Surgical History:  Procedure Laterality Date   BRAIN SURGERY      There were no vitals filed for this visit.   Subjective Assessment - 02/28/21 1303     Subjective Patient reports doing well. No falls or LBO since last session.    Pertinent History Patient is a pleasant 55 year old male who presents to physical therapy for weakness and immobility secondary to CVA.  Had a stroke in Feb 2018. He was previously fully independent, but this stroke caused severe residual deficits, mainly on the right side. He reports difficulty walking and decreased mobility. His mom had to move in with him and now is his main caregiver.  Patient had outpatient PT from November 2018-March 2022. Patient reports since being discharged from outpatient therapy he has kept up with his HEP daily. He does report desire for walking better and for getting in and out of bed easier. He reports his mom is helping with some ADLs. He reports needing help with  rolling in bed. He reports his mom/aide assist with dressing. He has a home aide 3 days out of the week;    Limitations Standing;Walking    How long can you sit comfortably? NA    How long can you stand comfortably? <30 min, does minimal standing;    How long can you walk comfortably? depends on the day, fatigue affects his walking; He is walking every day, about 15-20 min;    Diagnostic tests None recent    Patient Stated Goals to walk better and be independent in bed mobility;             Treatment  TherEx:  In // bars: Static standing, cuing for no UE support x4 min SLE marches in standing with SUE support x12 RLE, attempted on LLE but unable to weight shift and lift appropriately. Forward and backward walking x2 Side stepping down and back x 3 Standing balloon taps 2x 60sec, tactile cuing for hip extension. Min A required to prevent posterior lean as patient fatigued.    Seated thoracic extension 4x10 during seated rest breaks Seated thoracic rotation 2x10 during seated rest breaks Seated LAQS 4# ankle weights 1x10 each LE Seated Marches 4# ankle weights 1x10 each LE Seated Rows with GTB 1x10, cuing for upright posture Seated on dynadisc:  Static sitting 2x60sec  [place] twists/thoracic rotation 2x60sec  TherAc:  Toileting (void in standing) performed 1x at end of session with patient requiring assistance with donning doffing pants, stabilizing self, and washing of hands. Pt held onto grab bars for safety and stabilization.   Stand pivot transfer w/c<>chair. Difficulty with pivot steps, maneuvering RW (PT assisted) and foot clearance. PT directed for safe alignment to sitting surface.   Wheeling into/out of bathroom with focus on lip, requires assistance for entering bathroom, supervision only for exiting.    Pt educated throughout session about proper posture and technique with exercises. Improved exercise technique, movement at target joints, use of target muscles  after min to mod verbal, visual, tactile cues.    Patient gave good effort throughout PT today. Standing balance with decreased UE support was progressed today and tolerated well by pt although weight shifting to right and active left hip flexion is still challenging. Patient demonstrated significantly improved sitting posture and pelvic alignment during seated interventions on dynadisc and may benefit from a lumbar roll in WC throughout the day. Will continue to benefit from skilled PT to decrease gait deviations and increase strength in order to improve functional mobility, safety and independence with ADLs.                     PT Short Term Goals - 01/22/21 1436       PT SHORT TERM GOAL #1   Title Patient will be adherent to HEP at least 3x a week to improve functional strength and balance for better safety at home.    Baseline 11/15/2020- Patient reports performing seated LE hip march, knee ext, sit to stand and walking daily and states no questions. Advised patient to practice static standing using his walker or kitchen counter several times per day without UE support.    Time 4    Period Weeks    Status Achieved    Target Date 11/01/20      PT SHORT TERM GOAL #2   Title Patient will be independent in bed mobility including sit to supine utilizing bed rails as needed for increased mobility at home.    Baseline 6/29: requires min A 8/24: patient reports needing mod A    Time 4    Period Weeks    Status On-going    Target Date 02/19/21               PT Long Term Goals - 01/22/21 1431       PT LONG TERM GOAL #1   Title Patient will improve FOTO score to > 50% to indicate improve functional mobility at home and in community.    Baseline 6/29: 42% 8/24: 40% 10/17: 40%    Time 8    Period Weeks    Status On-going    Target Date 03/19/21      PT LONG TERM GOAL #2   Title Patient (< 27 years old) will complete five times sit to stand test in < 20 seconds  indicating an increased LE strength and improved balance.    Baseline 6/29: 23.8 sec with 2HHA. 11/15/2020= 48 sec with BUE Support and CGA using gait belt. 8/24: 23 seconds with 2 HHA 10/17: 19.23 seconds with BUE support    Time 8    Period Weeks    Status Achieved      PT LONG TERM GOAL #3   Title Patient will increase 10 meter walk test to >0.15 m/s (60 sec or less) as  to improve gait speed for better home ambulation and to reduce fall risk.    Baseline 6/29: 0.08 m/s with RW. 11/15/2020= 0.093 m/s with RW 8/24: 0.15 m/s    Time 8    Period Weeks    Status Achieved      PT LONG TERM GOAL #4   Title Patient will increase BLE gross strength to 4+/5 as to improve functional strength for independent gait, increased standing tolerance and increased ADL ability.    Baseline 6/29: see flowsheet. 8/24: hips grossly 4-/5 with hip extension 2+/5; L knee flexion 2+/5 knee extension 4/5; R knee 3+/5 10/17: see note    Time 8    Period Weeks    Status Partially Met    Target Date 03/19/21      PT LONG TERM GOAL #5   Title Patient will ambulate 250 ft with least assistive device and Supervision with no breaks to allow for increased mobility within home.    Baseline 11/15/2020-Ambulation distance = approx 110 feet using pediatric bariatric walker 8/24: 155 ft with bariatric RW 10/17: 170 ft with bariatric RW    Time 8    Period Weeks    Status Partially Met    Target Date 03/19/21      Additional Long Term Goals   Additional Long Term Goals Yes      PT LONG TERM GOAL #6   Title Patient will increase 10 meter walk test to >0.5 m/s ( 30 seconds) as to improve gait speed for better home ambulation and to reduce fall risk.    Baseline 8/24: 61 seconds: 0.15 m/s 10/17: 64 seconds ~0.15 m/s    Time 8    Period Weeks    Status Partially Met    Target Date 03/19/21      PT LONG TERM GOAL #7   Title Patient (< 47 years old) will complete five times sit to stand test in < 20 seconds with SUE support   indicating an increased LE strength and improved balance.    Baseline 10/17: 19.23 seconds with BUE support    Time 8    Period Weeks    Status New    Target Date 03/19/21                   Plan - 02/28/21 1357     Clinical Impression Statement Patient gave good effort throughout PT today. Standing balance with decreased UE support was progressed today and tolerated well by pt although weight shifting to right and active left hip flexion is still challenging. Patient demonstrated significantly improved sitting posture and pelvic alignment during seated interventions on dynadisc and may benefit from a lumbar roll in WC throughout the day. Will continue to benefit from skilled PT to decrease gait deviations and increase strength in order to improve functional mobility, safety and independence with ADLs.    Personal Factors and Comorbidities Comorbidity 3+;Education;Past/Current Experience;Time since onset of injury/illness/exacerbation;Transportation    Comorbidities HTN, arthritis, past CVA, high fall risk, obesity    Examination-Activity Limitations Bed Mobility;Locomotion Level;Squat;Stairs;Stand;Toileting;Transfers    Examination-Participation Restrictions Community Activity;Driving;Cleaning;Meal Prep;Shop;Volunteer;Yard Work    Stability/Clinical Decision Making Stable/Uncomplicated    Rehab Potential Good    PT Frequency 2x / week    PT Duration 8 weeks    PT Treatment/Interventions ADLs/Self Care Home Management;Cryotherapy;Moist Heat;Gait training;Stair training;Functional mobility training;Therapeutic activities;Therapeutic exercise;Balance training;Neuromuscular re-education;Patient/family education;Orthotic Fit/Training;Passive range of motion;Energy conservation    PT Next Visit Plan Continue seated dynadisc balance. Trial  lumbar roll in [place] no updates  this session; Reminder to patient to continue with daily walking    Consulted and Agree with Plan  of Care Patient             Patient will benefit from skilled therapeutic intervention in order to improve the following deficits and impairments:  Abnormal gait, Decreased balance, Decreased endurance, Decreased mobility, Difficulty walking, Hypomobility, Obesity, Improper body mechanics, Decreased activity tolerance, Decreased strength, Postural dysfunction  Visit Diagnosis: Other lack of coordination  Abnormality of gait and mobility  Difficulty in walking, not elsewhere classified     Problem List Patient Active Problem List   Diagnosis Date Noted   Impaired glucose tolerance 03/23/2020   Learning disability 03/23/2020   Urinary and fecal incontinence 06/14/2019   Chronic pain syndrome 03/23/2019   Constipation 03/23/2019   Dysarthria 03/23/2019   Dysphagia 03/23/2019   Learning difficulty 03/23/2019   Morbid obesity ([place]) 03/23/2019   Muscle weakness 03/23/2019   Vitamin D deficiency 03/23/2019   Bilateral carotid artery stenosis 12/15/2018   Moderate aortic valve stenosis 12/15/2018   BMI 45.0-49.9, adult ([place]) 11/10/2018   Cerebrovascular accident ([place]) 05/02/2016   Hemiparesis affecting right side as late effect of cerebrovascular accident ([place]) 05/01/2016   Brain tumor ([place]) 11/24/2014   Gastro-esophageal reflux disease without esophagitis 11/24/2014   Accident due to mechanical fall without injury 08/03/2014   Essential hypertension 06/13/2014   Mixed hyperlipidemia 06/13/2014   Unspecified sequelae of cerebral infarction 06/13/2014   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/2014   Type 2 diabetes mellitus without complication ([place]) 19/69/4098   Neck mass 09/08/2013   Swimmer's ear 09/08/2013   Erectile dysfunction 01/03/2013   Prediabetes 01/03/2013   Arsenio Katz, SPT  This entire session was performed under direct supervision and direction of a licensed therapist/therapist assistant . I have personally read, edited and approve of the note as written.  Janna Arch,  PT, DPT  02/28/2021, 2:03 PM  [place] MAIN [facility] SERVICES [address] [place], [place], [address] Phone: [phone]   Fax:  [phone]  Name: MAICO MULVEHILL MRN: [id number] Date of Birth: [birth year]

## 2021-03-05 ENCOUNTER — Other Ambulatory Visit: Payer: Self-pay

## 2021-03-05 ENCOUNTER — Ambulatory Visit: Payer: Medicare Other

## 2021-03-05 DIAGNOSIS — R278 Other lack of coordination: Secondary | ICD-10-CM

## 2021-03-05 DIAGNOSIS — R262 Difficulty in walking, not elsewhere classified: Secondary | ICD-10-CM

## 2021-03-05 DIAGNOSIS — R269 Unspecified abnormalities of gait and mobility: Secondary | ICD-10-CM | POA: Diagnosis not present

## 2021-03-05 NOTE — Therapy (Signed)
[place] MAIN [facility] SERVICES [address] [place], [place], [address] Phone: [phone]   Fax:  [phone]  Physical Therapy Treatment  Patient Details  Name: Curtis Powell MRN: [id number] Date of Birth: [birth year] Referring Provider (PT): Kym Groom MD   Encounter Date: 03/05/2021   PT End of Session - 03/05/21 1553     Visit Number 37    Number of Visits 42    Date for PT Re-Evaluation 03/19/21    Authorization Type UHC Medicare; [place] Medicaid    Authorization Time Period 5/10 10/31    PT Start Time 62    PT Stop Time 1345    PT Time Calculation (min) 45 min    Equipment Utilized During Treatment Gait belt    Activity Tolerance Patient tolerated treatment well;Patient limited by fatigue    Behavior During Therapy WFL for tasks assessed/performed             Past Medical History:  Diagnosis Date   GERD (gastroesophageal reflux disease)    Hyperlipidemia    Hypertension    Obesity    Stroke [facility] [place])     Past Surgical History:  Procedure Laterality Date   BRAIN SURGERY      There were no vitals filed for this visit.   Subjective Assessment - 03/05/21 1343     Subjective Patient reports doing well. Family came and visited for the holiday and he had a nice time. Denies falls or LOB since last visit. Was able to walk once at home.    Pertinent History Patient is a pleasant 55 year old male who presents to physical therapy for weakness and immobility secondary to CVA.  Had a stroke in Feb 2018. He was previously fully independent, but this stroke caused severe residual deficits, mainly on the right side. He reports difficulty walking and decreased mobility. His mom had to move in with him and now is his main caregiver.  Patient had outpatient PT from November 2018-March 2022. Patient reports since being discharged from outpatient therapy he has kept up with his HEP daily. He does report desire for walking better and for getting in  and out of bed easier. He reports his mom is helping with some ADLs. He reports needing help with rolling in bed. He reports his mom/aide assist with dressing. He has a home aide 3 days out of the week;    Limitations Standing;Walking    How long can you sit comfortably? NA    How long can you stand comfortably? <30 min, does minimal standing;    How long can you walk comfortably? depends on the day, fatigue affects his walking; He is walking every day, about 15-20 min;    Diagnostic tests None recent    Patient Stated Goals to walk better and be independent in bed mobility;    Currently in Pain? No/denies    Pain Score 0-No pain             TherEx:  In // bars, with CGA: Static standing, cuing for upright posture, no UE support. 2x45sec. Standing reaching overhead and across body to alternating sticky notes 1x20 each direction. Forwards/backwards walking x2 lengths of bars. Side stepping x2 lengths of bars. Standing single leg marches with SUE support 1x10 each LE. Very challenging for pt.  Seated thoracic extension 1x15. Seated on dynadisc, [place] twists/crossbody reaches to SPT hand at varying heights to promote weight shift. 1x15 each side. Lumbar roll in WC trialed with  towel; Difficult to achieve pelvic neutral, may need thicker roll or half foam roll. Seated on dynadisc, alternating marches. Min A needed by SPT to lift RLE. 1x10 each LE. Seated LAQs with 3# ankle weights attempted, hard for pt and discontinued due to movement quality.  TherAc:   Toileting (void in standing) performed 1x at end of session with patient requiring assistance with donning doffing pants, stabilizing self, and washing of hands. Pt held onto grab bars for safety and stabilization.   Supervision only needed by SPT for door management to bathroom.   Pt demonstrated improved independence with navigating WC in bathroom and ability to navigate lip on floor at entrance.   Pt educated throughout  session about proper posture and technique with exercises. Improved exercise technique, movement at target joints, use of target muscles after min to mod verbal, visual, tactile cues.   Patient presents to PT with no complaints of pain and gave good effort throughout . Patient demonstrated improved posture when asked to scoot forward in chair without need for cuing. Posterior pelvic tilt and slumped posture remain when patient is scooted back in chair; towel roll unsuccessful in promoting pelvic neutral. Largest deficits remaining include SL balance, strength, and gait deviations. Will continue to benefit from skilled PT to decrease gait deviations, improve balance and increase independence with ADLs and safety with ambulation.                        PT Education - 03/05/21 1553     Education Details Exercise technique, body mechanics    Person(s) Educated Patient    Methods Explanation;Demonstration;Tactile cues;Verbal cues    Comprehension Verbal cues required;Returned demonstration;Verbalized understanding;Tactile cues required              PT Short Term Goals - 01/22/21 1436       PT SHORT TERM GOAL #1   Title Patient will be adherent to HEP at least 3x a week to improve functional strength and balance for better safety at home.    Baseline 11/15/2020- Patient reports performing seated LE hip march, knee ext, sit to stand and walking daily and states no questions. Advised patient to practice static standing using his walker or kitchen counter several times per day without UE support.    Time 4    Period Weeks    Status Achieved    Target Date 11/01/20      PT SHORT TERM GOAL #2   Title Patient will be independent in bed mobility including sit to supine utilizing bed rails as needed for increased mobility at home.    Baseline 6/29: requires min A 8/24: patient reports needing mod A    Time 4    Period Weeks    Status On-going    Target Date 02/19/21                PT Long Term Goals - 01/22/21 1431       PT LONG TERM GOAL #1   Title Patient will improve FOTO score to > 50% to indicate improve functional mobility at home and in community.    Baseline 6/29: 42% 8/24: 40% 10/17: 40%    Time 8    Period Weeks    Status On-going    Target Date 03/19/21      PT LONG TERM GOAL #2   Title Patient (< 63 years old) will complete five times sit to stand test in < 20 seconds indicating  an increased LE strength and improved balance.    Baseline 6/29: 23.8 sec with 2HHA. 11/15/2020= 48 sec with BUE Support and CGA using gait belt. 8/24: 23 seconds with 2 HHA 10/17: 19.23 seconds with BUE support    Time 8    Period Weeks    Status Achieved      PT LONG TERM GOAL #3   Title Patient will increase 10 meter walk test to >0.15 m/s (60 sec or less) as to improve gait speed for better home ambulation and to reduce fall risk.    Baseline 6/29: 0.08 m/s with RW. 11/15/2020= 0.093 m/s with RW 8/24: 0.15 m/s    Time 8    Period Weeks    Status Achieved      PT LONG TERM GOAL #4   Title Patient will increase BLE gross strength to 4+/5 as to improve functional strength for independent gait, increased standing tolerance and increased ADL ability.    Baseline 6/29: see flowsheet. 8/24: hips grossly 4-/5 with hip extension 2+/5; L knee flexion 2+/5 knee extension 4/5; R knee 3+/5 10/17: see note    Time 8    Period Weeks    Status Partially Met    Target Date 03/19/21      PT LONG TERM GOAL #5   Title Patient will ambulate 250 ft with least assistive device and Supervision with no breaks to allow for increased mobility within home.    Baseline 11/15/2020-Ambulation distance = approx 110 feet using pediatric bariatric walker 8/24: 155 ft with bariatric RW 10/17: 170 ft with bariatric RW    Time 8    Period Weeks    Status Partially Met    Target Date 03/19/21      Additional Long Term Goals   Additional Long Term Goals Yes      PT LONG TERM GOAL #6    Title Patient will increase 10 meter walk test to >0.5 m/s ( 30 seconds) as to improve gait speed for better home ambulation and to reduce fall risk.    Baseline 8/24: 61 seconds: 0.15 m/s 10/17: 64 seconds ~0.15 m/s    Time 8    Period Weeks    Status Partially Met    Target Date 03/19/21      PT LONG TERM GOAL #7   Title Patient (< 58 years old) will complete five times sit to stand test in < 20 seconds with SUE support  indicating an increased LE strength and improved balance.    Baseline 10/17: 19.23 seconds with BUE support    Time 8    Period Weeks    Status New    Target Date 03/19/21                   Plan - 03/05/21 1553     Clinical Impression Statement Patient presents to PT with no complaints of pain and gave good effort throughout . Patient demonstrated improved posture when asked to scoot forward in chair without need for cuing. Posterior pelvic tilt and slumped posture remain when patient is scooted back in chair; towel roll unsuccessful in promoting pelvic neutral. Largest deficits remaining include SL balance, strength, and gait deviations. Will continue to benefit from skilled PT to decrease gait deviations, improve balance and increase independence with ADLs and safety with ambulation.    Personal Factors and Comorbidities Comorbidity 3+;Education;Past/Current Experience;Time since onset of injury/illness/exacerbation;Transportation    Comorbidities HTN, arthritis, past CVA, high fall risk, obesity  Examination-Activity Limitations Bed Mobility;Locomotion Level;Squat;Stairs;Stand;Toileting;Transfers    Examination-Participation Restrictions Community Activity;Driving;Cleaning;Meal Prep;Shop;Volunteer;Yard Work    Stability/Clinical Decision Making Stable/Uncomplicated    Rehab Potential Good    PT Frequency 2x / week    PT Duration 8 weeks    PT Treatment/Interventions ADLs/Self Care Home Management;Cryotherapy;Moist Heat;Gait training;Stair  training;Functional mobility training;Therapeutic activities;Therapeutic exercise;Balance training;Neuromuscular re-education;Patient/family education;Orthotic Fit/Training;Passive range of motion;Energy conservation    PT Next Visit Plan Continue seated dynadisc balance. Trial lumbar roll in WC    PT Home Exercise Plan no updates  this session; Reminder to patient to continue with daily walking    Consulted and Agree with Plan of Care Patient             Patient will benefit from skilled therapeutic intervention in order to improve the following deficits and impairments:  Abnormal gait, Decreased balance, Decreased endurance, Decreased mobility, Difficulty walking, Hypomobility, Obesity, Improper body mechanics, Decreased activity tolerance, Decreased strength, Postural dysfunction  Visit Diagnosis: Abnormality of gait and mobility  Difficulty in walking, not elsewhere classified  Other lack of coordination     Problem List Patient Active Problem List   Diagnosis Date Noted   Impaired glucose tolerance 03/23/2020   Learning disability 03/23/2020   Urinary and fecal incontinence 06/14/2019   Chronic pain syndrome 03/23/2019   Constipation 03/23/2019   Dysarthria 03/23/2019   Dysphagia 03/23/2019   Learning difficulty 03/23/2019   Morbid obesity ([place]) 03/23/2019   Muscle weakness 03/23/2019   Vitamin D deficiency 03/23/2019   Bilateral carotid artery stenosis 12/15/2018   Moderate aortic valve stenosis 12/15/2018   BMI 45.0-49.9, adult ([place]) 11/10/2018   Cerebrovascular accident ([place]) 05/02/2016   Hemiparesis affecting right side as late effect of cerebrovascular accident ([place]) 05/01/2016   Brain tumor ([place]) 11/24/2014   Gastro-esophageal reflux disease without esophagitis 11/24/2014   Accident due to mechanical fall without injury 08/03/2014   Essential hypertension 06/13/2014   Mixed hyperlipidemia 06/13/2014   Unspecified sequelae of cerebral infarction 06/13/2014    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 I have personally read, edited and approve of the note as written.  Janna Arch, PT, DPT  03/05/2021, 4:57 PM  [place] MAIN [facility] SERVICES [address] [place], [place], [address] Phone: [phone]   Fax:  [phone]  Name: Curtis Powell MRN: [id number] Date of Birth: Sep 20, 1965

## 2021-03-07 ENCOUNTER — Other Ambulatory Visit: Payer: Self-pay

## 2021-03-07 ENCOUNTER — Ambulatory Visit: Payer: Medicare Other

## 2021-03-07 DIAGNOSIS — R269 Unspecified abnormalities of gait and mobility: Secondary | ICD-10-CM

## 2021-03-07 DIAGNOSIS — R262 Difficulty in walking, not elsewhere classified: Secondary | ICD-10-CM

## 2021-03-07 DIAGNOSIS — R2681 Unsteadiness on feet: Secondary | ICD-10-CM

## 2021-03-07 NOTE — Therapy (Signed)
[place] MAIN [facility] SERVICES [address] [place], [place], [address] Phone: [phone]   Fax:  [phone]  Physical Therapy Treatment  Patient Details  Name: Curtis Powell MRN: [id number] Date of Birth: 1965/09/29 Referring Provider (PT): Kym Groom MD   Encounter Date: 03/07/2021   PT End of Session - 03/07/21 1256     Visit Number 38    Number of Visits 42    Date for PT Re-Evaluation 03/19/21    Authorization Type UHC Medicare; [place] Medicaid    Authorization Time Period 8/10 10/31    PT Start Time 1300    PT Stop Time 1344    PT Time Calculation (min) 44 min    Equipment Utilized During Treatment Gait belt    Activity Tolerance Patient tolerated treatment well;Patient limited by fatigue    Behavior During Therapy WFL for tasks assessed/performed             Past Medical History:  Diagnosis Date   GERD (gastroesophageal reflux disease)    Hyperlipidemia    Hypertension    Obesity    Stroke [facility])     Past Surgical History:  Procedure Laterality Date   BRAIN SURGERY      There were no vitals filed for this visit.   Subjective Assessment - 03/07/21 1318     Subjective Patient reports no stumbles or falls since last session. No pain reported.    Pertinent History Patient is a pleasant 55 year old male who presents to physical therapy for weakness and immobility secondary to CVA.  Had a stroke in Feb 2018. He was previously fully independent, but this stroke caused severe residual deficits, mainly on the right side. He reports difficulty walking and decreased mobility. His mom had to move in with him and now is his main caregiver.  Patient had outpatient PT from November 2018-March 2022. Patient reports since being discharged from outpatient therapy he has kept up with his HEP daily. He does report desire for walking better and for getting in and out of bed easier. He reports his mom is helping with some ADLs. He reports needing  help with rolling in bed. He reports his mom/aide assist with dressing. He has a home aide 3 days out of the week;    Limitations Standing;Walking    How long can you sit comfortably? NA    How long can you stand comfortably? <30 min, does minimal standing;    How long can you walk comfortably? depends on the day, fatigue affects his walking; He is walking every day, about 15-20 min;    Diagnostic tests None recent    Patient Stated Goals to walk better and be independent in bed mobility;    Currently in Pain? No/denies                    TherEx:   Ambulate 130 ft with one seated rest break due to therapist requiring stop (shoelaces untied). Wheelchair follow with CGA.   Standing with RW: 10x STS with focus on full stand with one hand on walker one hand chair and transition to both hands on walker at full stand, then return to one hand on walker one hand on chair; 2x LOB   3lb ankle weights standing with RW with use of mirror in front of patient for visual cue -march 10x each LE; very challenging, patient reports more challenging to lift LLE.  -hip extension 10x each LE; very challenging  Seated with 3lb ankle weight: -LAQ 12x 3 second holds each LE in wheelchair  Seated hamstring isometric pressing into dynadisc 10 x 3 second holds   TherAc:   Toileting (void in standing) performed 1x at end of session with patient requiring assistance with donning doffing pants, stabilizing self, and washing of hands. Pt held onto grab bars for safety and stabilization.   Supervision only needed by PT for door management to bathroom.    Pt demonstrated improved independence with navigating WC in bathroom and ability to navigate lip on floor at entrance.     Pt educated throughout session about proper posture and technique with exercises. Improved exercise technique, movement at target joints, use of target muscles after min to mod verbal, visual, tactile cues.     Patient is  highly motivated throughout session. He is challenged with mainlining a full upright posture during repeated sit to stands requiring multimodal cueing. Patient is challenged with weight acceptance onto RLE resulting in decreased ability to lift LLE off ground. Will continue to benefit from skilled PT to decrease gait deviations, improve balance and increase independence with ADLs and safety with ambulation.                PT Education - 03/07/21 1256     Education Details exercise technique, body mechanics    Person(s) Educated Patient    Methods Explanation;Tactile cues;Demonstration;Verbal cues    Comprehension Verbalized understanding;Returned demonstration;Verbal cues required;Tactile cues required              PT Short Term Goals - 01/22/21 1436       PT SHORT TERM GOAL #1   Title Patient will be adherent to HEP at least 3x a week to improve functional strength and balance for better safety at home.    Baseline 11/15/2020- Patient reports performing seated LE hip march, knee ext, sit to stand and walking daily and states no questions. Advised patient to practice static standing using his walker or kitchen counter several times per day without UE support.    Time 4    Period Weeks    Status Achieved    Target Date 11/01/20      PT SHORT TERM GOAL #2   Title Patient will be independent in bed mobility including sit to supine utilizing bed rails as needed for increased mobility at home.    Baseline 6/29: requires min A 8/24: patient reports needing mod A    Time 4    Period Weeks    Status On-going    Target Date 02/19/21               PT Long Term Goals - 01/22/21 1431       PT LONG TERM GOAL #1   Title Patient will improve FOTO score to > 50% to indicate improve functional mobility at home and in community.    Baseline 6/29: 42% 8/24: 40% 10/17: 40%    Time 8    Period Weeks    Status On-going    Target Date 03/19/21      PT LONG TERM GOAL #2    Title Patient (< 40 years old) will complete five times sit to stand test in < 20 seconds indicating an increased LE strength and improved balance.    Baseline 6/29: 23.8 sec with 2HHA. 11/15/2020= 48 sec with BUE Support and CGA using gait belt. 8/24: 23 seconds with 2 HHA 10/17: 19.23 seconds with BUE support    Time  8    Period Weeks    Status Achieved      PT LONG TERM GOAL #3   Title Patient will increase 10 meter walk test to >0.15 m/s (60 sec or less) as to improve gait speed for better home ambulation and to reduce fall risk.    Baseline 6/29: 0.08 m/s with RW. 11/15/2020= 0.093 m/s with RW 8/24: 0.15 m/s    Time 8    Period Weeks    Status Achieved      PT LONG TERM GOAL #4   Title Patient will increase BLE gross strength to 4+/5 as to improve functional strength for independent gait, increased standing tolerance and increased ADL ability.    Baseline 6/29: see flowsheet. 8/24: hips grossly 4-/5 with hip extension 2+/5; L knee flexion 2+/5 knee extension 4/5; R knee 3+/5 10/17: see note    Time 8    Period Weeks    Status Partially Met    Target Date 03/19/21      PT LONG TERM GOAL #5   Title Patient will ambulate 250 ft with least assistive device and Supervision with no breaks to allow for increased mobility within home.    Baseline 11/15/2020-Ambulation distance = approx 110 feet using pediatric bariatric walker 8/24: 155 ft with bariatric RW 10/17: 170 ft with bariatric RW    Time 8    Period Weeks    Status Partially Met    Target Date 03/19/21      Additional Long Term Goals   Additional Long Term Goals Yes      PT LONG TERM GOAL #6   Title Patient will increase 10 meter walk test to >0.5 m/s ( 30 seconds) as to improve gait speed for better home ambulation and to reduce fall risk.    Baseline 8/24: 61 seconds: 0.15 m/s 10/17: 64 seconds ~0.15 m/s    Time 8    Period Weeks    Status Partially Met    Target Date 03/19/21      PT LONG TERM GOAL #7   Title Patient (<  64 years old) will complete five times sit to stand test in < 20 seconds with SUE support  indicating an increased LE strength and improved balance.    Baseline 10/17: 19.23 seconds with BUE support    Time 8    Period Weeks    Status New    Target Date 03/19/21                   Plan - 03/07/21 1328     Clinical Impression Statement Patient is highly motivated throughout session. He is challenged with mainlining a full upright posture during repeated sit to stands requiring multimodal cueing. Patient is challenged with weight acceptance onto RLE resulting in decreased ability to lift LLE off ground. Will continue to benefit from skilled PT to decrease gait deviations, improve balance and increase independence with ADLs and safety with ambulation.    Personal Factors and Comorbidities Comorbidity 3+;Education;Past/Current Experience;Time since onset of injury/illness/exacerbation;Transportation    Comorbidities HTN, arthritis, past CVA, high fall risk, obesity    Examination-Activity Limitations Bed Mobility;Locomotion Level;Squat;Stairs;Stand;Toileting;Transfers    Examination-Participation Restrictions Community Activity;Driving;Cleaning;Meal Prep;Shop;Volunteer;Yard Work    Stability/Clinical Decision Making Stable/Uncomplicated    Rehab Potential Good    PT Frequency 2x / week    PT Duration 8 weeks    PT Treatment/Interventions ADLs/Self Care Home Management;Cryotherapy;Moist Heat;Gait training;Stair training;Functional mobility training;Therapeutic activities;Therapeutic exercise;Balance training;Neuromuscular re-education;Patient/family education;Orthotic Fit/Training;Passive  range of motion;Energy conservation    PT Next Visit Plan Continue seated dynadisc balance. Trial lumbar roll in WC    PT Home Exercise Plan no updates  this session; Reminder to patient to continue with daily walking    Consulted and Agree with Plan of Care Patient             Patient will benefit  from skilled therapeutic intervention in order to improve the following deficits and impairments:  Abnormal gait, Decreased balance, Decreased endurance, Decreased mobility, Difficulty walking, Hypomobility, Obesity, Improper body mechanics, Decreased activity tolerance, Decreased strength, Postural dysfunction  Visit Diagnosis: Abnormality of gait and mobility  Difficulty in walking, not elsewhere classified  Unsteadiness on feet     Problem List Patient Active Problem List   Diagnosis Date Noted   Impaired glucose tolerance 03/23/2020   Learning disability 03/23/2020   Urinary and fecal incontinence 06/14/2019   Chronic pain syndrome 03/23/2019   Constipation 03/23/2019   Dysarthria 03/23/2019   Dysphagia 03/23/2019   Learning difficulty 03/23/2019   Morbid obesity ([place]) 03/23/2019   Muscle weakness 03/23/2019   Vitamin D deficiency 03/23/2019   Bilateral carotid artery stenosis 12/15/2018   Moderate aortic valve stenosis 12/15/2018   BMI 45.0-49.9, adult ([place]) 11/10/2018   Cerebrovascular accident ([place]) 05/02/2016   Hemiparesis affecting right side as late effect of cerebrovascular accident ([place]) 05/01/2016   Brain tumor ([place]) 11/24/2014   Gastro-esophageal reflux disease without esophagitis 11/24/2014   Accident due to mechanical fall without injury 08/03/2014   Essential hypertension 06/13/2014   Mixed hyperlipidemia 06/13/2014   Unspecified sequelae of cerebral infarction 06/13/2014   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 MAIN [facility] SERVICES [address] [place], [place], [address] Phone: [phone]   Fax:  [phone]  Name: Curtis Powell MRN: [id number] Date of Birth: 06-26-1965

## 2021-03-12 ENCOUNTER — Other Ambulatory Visit: Payer: Self-pay

## 2021-03-12 ENCOUNTER — Ambulatory Visit: Payer: Medicare Other | Attending: Family Medicine

## 2021-03-12 DIAGNOSIS — R262 Difficulty in walking, not elsewhere classified: Secondary | ICD-10-CM | POA: Insufficient documentation

## 2021-03-12 DIAGNOSIS — R269 Unspecified abnormalities of gait and mobility: Secondary | ICD-10-CM | POA: Insufficient documentation

## 2021-03-12 DIAGNOSIS — R278 Other lack of coordination: Secondary | ICD-10-CM | POA: Insufficient documentation

## 2021-03-12 DIAGNOSIS — R2681 Unsteadiness on feet: Secondary | ICD-10-CM | POA: Diagnosis present

## 2021-03-12 DIAGNOSIS — M6281 Muscle weakness (generalized): Secondary | ICD-10-CM | POA: Diagnosis present

## 2021-03-12 DIAGNOSIS — R296 Repeated falls: Secondary | ICD-10-CM | POA: Insufficient documentation

## 2021-03-12 NOTE — Therapy (Signed)
[place] MAIN [facility] SERVICES [address] [place], [place], [address] Phone: [phone]   Fax:  [phone]  Physical Therapy Treatment  Patient Details  Name: Curtis Powell MRN: [id number] Date of Birth: 12-23-1965 Referring Provider (PT): Kym Groom MD   Encounter Date: 03/12/2021   PT End of Session - 03/12/21 1340     Visit Number 39    Number of Visits 42    Date for PT Re-Evaluation 03/19/21    Authorization Type UHC Medicare; [place] Medicaid    Authorization Time Period 9/10 10/31    PT Start Time 1345    PT Stop Time 1429    PT Time Calculation (min) 44 min    Equipment Utilized During Treatment Gait belt    Activity Tolerance Patient tolerated treatment well;Patient limited by fatigue    Behavior During Therapy WFL for tasks assessed/performed             Past Medical History:  Diagnosis Date   GERD (gastroesophageal reflux disease)    Hyperlipidemia    Hypertension    Obesity    Stroke [facility])     Past Surgical History:  Procedure Laterality Date   BRAIN SURGERY      There were no vitals filed for this visit.   Subjective Assessment - 03/12/21 1349     Subjective Patient reports he walked with his uncle over the weekend. No falls or LOB since last session.    Pertinent History Patient is a pleasant 55 year old male who presents to physical therapy for weakness and immobility secondary to CVA.  Had a stroke in Feb 2018. He was previously fully independent, but this stroke caused severe residual deficits, mainly on the right side. He reports difficulty walking and decreased mobility. His mom had to move in with him and now is his main caregiver.  Patient had outpatient PT from November 2018-March 2022. Patient reports since being discharged from outpatient therapy he has kept up with his HEP daily. He does report desire for walking better and for getting in and out of bed easier. He reports his mom is helping with some ADLs.  He reports needing help with rolling in bed. He reports his mom/aide assist with dressing. He has a home aide 3 days out of the week;    Limitations Standing;Walking    How long can you sit comfortably? NA    How long can you stand comfortably? <30 min, does minimal standing;    How long can you walk comfortably? depends on the day, fatigue affects his walking; He is walking every day, about 15-20 min;    Diagnostic tests None recent    Patient Stated Goals to walk better and be independent in bed mobility;    Currently in Pain? No/denies                  TherEx:   Ambulate 160 ft with one seated rest break. Wheelchair follow with CGA. Decreased stance phase on RLE.    Standing with RW: 10x STS with focus on full stand with one hand on walker one hand chair and transition to both hands on walker at full stand, then return to one hand on walker one hand on chair; 2x LOB  Standing reach outside BOS and place ball on target 15x each UE; 30x total  Standing 3lb bar chest press 10x one near LOB  Seated: 3lb bar straight arm raise 12x 3lb bar row 15x cue  for scapular retraction Alternating toe /heel taps 30 seconds      TherAc:   Toileting (void in standing) performed 1x at end of session with patient requiring assistance with donning doffing pants, stabilizing self, and washing of hands. Pt held onto grab bars for safety and stabilization.   Supervision only needed by PT for door management to bathroom.    Pt demonstrated improved independence with navigating WC in bathroom and ability to navigate lip on floor at entrance.     Pt educated throughout session about proper posture and technique with exercises. Improved exercise technique, movement at target joints, use of target muscles after min to mod verbal, visual, tactile cues.    Patient presents to physical therapy with excellent motivation. He is aware next session is his progress note and he will have to perform  goals. Core and UE strengthening tolerated this session with 3lb weighted bar. Patient is challenged with weight acceptance onto RLE resulting in decreased ability to lift LLE off ground. Will continue to benefit from skilled PT to decrease gait deviations, improve balance and increase independence with ADLs and safety with ambulation.                  PT Education - 03/12/21 1341     Education Details exercise technique, body mechanics    Person(s) Educated Patient    Methods Explanation;Demonstration;Tactile cues;Verbal cues    Comprehension Verbalized understanding;Returned demonstration;Verbal cues required;Tactile cues required              PT Short Term Goals - 01/22/21 1436       PT SHORT TERM GOAL #1   Title Patient will be adherent to HEP at least 3x a week to improve functional strength and balance for better safety at home.    Baseline 11/15/2020- Patient reports performing seated LE hip march, knee ext, sit to stand and walking daily and states no questions. Advised patient to practice static standing using his walker or kitchen counter several times per day without UE support.    Time 4    Period Weeks    Status Achieved    Target Date 11/01/20      PT SHORT TERM GOAL #2   Title Patient will be independent in bed mobility including sit to supine utilizing bed rails as needed for increased mobility at home.    Baseline 6/29: requires min A 8/24: patient reports needing mod A    Time 4    Period Weeks    Status On-going    Target Date 02/19/21               PT Long Term Goals - 01/22/21 1431       PT LONG TERM GOAL #1   Title Patient will improve FOTO score to > 50% to indicate improve functional mobility at home and in community.    Baseline 6/29: 42% 8/24: 40% 10/17: 40%    Time 8    Period Weeks    Status On-going    Target Date 03/19/21      PT LONG TERM GOAL #2   Title Patient (< 25 years old) will complete five times sit to stand  test in < 20 seconds indicating an increased LE strength and improved balance.    Baseline 6/29: 23.8 sec with 2HHA. 11/15/2020= 48 sec with BUE Support and CGA using gait belt. 8/24: 23 seconds with 2 HHA 10/17: 19.23 seconds with BUE support    Time 8  Period Weeks    Status Achieved      PT LONG TERM GOAL #3   Title Patient will increase 10 meter walk test to >0.15 m/s (60 sec or less) as to improve gait speed for better home ambulation and to reduce fall risk.    Baseline 6/29: 0.08 m/s with RW. 11/15/2020= 0.093 m/s with RW 8/24: 0.15 m/s    Time 8    Period Weeks    Status Achieved      PT LONG TERM GOAL #4   Title Patient will increase BLE gross strength to 4+/5 as to improve functional strength for independent gait, increased standing tolerance and increased ADL ability.    Baseline 6/29: see flowsheet. 8/24: hips grossly 4-/5 with hip extension 2+/5; L knee flexion 2+/5 knee extension 4/5; R knee 3+/5 10/17: see note    Time 8    Period Weeks    Status Partially Met    Target Date 03/19/21      PT LONG TERM GOAL #5   Title Patient will ambulate 250 ft with least assistive device and Supervision with no breaks to allow for increased mobility within home.    Baseline 11/15/2020-Ambulation distance = approx 110 feet using pediatric bariatric walker 8/24: 155 ft with bariatric RW 10/17: 170 ft with bariatric RW    Time 8    Period Weeks    Status Partially Met    Target Date 03/19/21      Additional Long Term Goals   Additional Long Term Goals Yes      PT LONG TERM GOAL #6   Title Patient will increase 10 meter walk test to >0.5 m/s ( 30 seconds) as to improve gait speed for better home ambulation and to reduce fall risk.    Baseline 8/24: 61 seconds: 0.15 m/s 10/17: 64 seconds ~0.15 m/s    Time 8    Period Weeks    Status Partially Met    Target Date 03/19/21      PT LONG TERM GOAL #7   Title Patient (< 34 years old) will complete five times sit to stand test in < 20  seconds with SUE support  indicating an increased LE strength and improved balance.    Baseline 10/17: 19.23 seconds with BUE support    Time 8    Period Weeks    Status New    Target Date 03/19/21                   Plan - 03/12/21 1508     Clinical Impression Statement Patient presents to physical therapy with excellent motivation. He is aware next session is his progress note and he will have to perform goals. Core and UE strengthening tolerated this session with 3lb weighted bar. Patient is challenged with weight acceptance onto RLE resulting in decreased ability to lift LLE off ground. Will continue to benefit from skilled PT to decrease gait deviations, improve balance and increase independence with ADLs and safety with ambulation.    Personal Factors and Comorbidities Comorbidity 3+;Education;Past/Current Experience;Time since onset of injury/illness/exacerbation;Transportation    Comorbidities HTN, arthritis, past CVA, high fall risk, obesity    Examination-Activity Limitations Bed Mobility;Locomotion Level;Squat;Stairs;Stand;Toileting;Transfers    Examination-Participation Restrictions Community Activity;Driving;Cleaning;Meal Prep;Shop;Volunteer;Yard Work    Stability/Clinical Decision Making Stable/Uncomplicated    Rehab Potential Good    PT Frequency 2x / week    PT Duration 8 weeks    PT Treatment/Interventions ADLs/Self Care Home Management;Cryotherapy;Moist Heat;Gait training;Stair training;Functional  mobility training;Therapeutic activities;Therapeutic exercise;Balance training;Neuromuscular re-education;Patient/family education;Orthotic Fit/Training;Passive range of motion;Energy conservation    PT Next Visit Plan Continue seated dynadisc balance. Trial lumbar roll in WC    PT Home Exercise Plan no updates  this session; Reminder to patient to continue with daily walking    Consulted and Agree with Plan of Care Patient             Patient will benefit from  skilled therapeutic intervention in order to improve the following deficits and impairments:  Abnormal gait, Decreased balance, Decreased endurance, Decreased mobility, Difficulty walking, Hypomobility, Obesity, Improper body mechanics, Decreased activity tolerance, Decreased strength, Postural dysfunction  Visit Diagnosis: Abnormality of gait and mobility  Unsteadiness on feet  Muscle weakness (generalized)     Problem List Patient Active Problem List   Diagnosis Date Noted   Impaired glucose tolerance 03/23/2020   Learning disability 03/23/2020   Urinary and fecal incontinence 06/14/2019   Chronic pain syndrome 03/23/2019   Constipation 03/23/2019   Dysarthria 03/23/2019   Dysphagia 03/23/2019   Learning difficulty 03/23/2019   Morbid obesity ([place]) 03/23/2019   Muscle weakness 03/23/2019   Vitamin D deficiency 03/23/2019   Bilateral carotid artery stenosis 12/15/2018   Moderate aortic valve stenosis 12/15/2018   BMI 45.0-49.9, adult ([place]) 11/10/2018   Cerebrovascular accident ([place]) 05/02/2016   Hemiparesis affecting right side as late effect of cerebrovascular accident ([place]) 05/01/2016   Brain tumor ([place]) 11/24/2014   Gastro-esophageal reflux disease without esophagitis 11/24/2014   Accident due to mechanical fall without injury 08/03/2014   Essential hypertension 06/13/2014   Mixed hyperlipidemia 06/13/2014   Unspecified sequelae of cerebral infarction 06/13/2014   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/2014   Type 2 diabetes mellitus without complication ([place]) 27/09/2374   Neck mass 09/08/2013   Swimmer's ear 09/08/2013   Erectile dysfunction 01/03/2013   Prediabetes 01/03/2013   Janna Arch, PT, DPT  03/12/2021, 3:09 PM  [place] [facility] [place] MAIN [facility] SERVICES [address] [place], [place], [address] Phone: [phone]   Fax:  [phone]  Name: Curtis Powell MRN: [id number] Date of Birth: 05-05-1965

## 2021-03-14 ENCOUNTER — Ambulatory Visit: Payer: Medicare Other

## 2021-03-14 ENCOUNTER — Other Ambulatory Visit: Payer: Self-pay

## 2021-03-14 DIAGNOSIS — R269 Unspecified abnormalities of gait and mobility: Secondary | ICD-10-CM | POA: Diagnosis not present

## 2021-03-14 DIAGNOSIS — R2681 Unsteadiness on feet: Secondary | ICD-10-CM

## 2021-03-14 DIAGNOSIS — R262 Difficulty in walking, not elsewhere classified: Secondary | ICD-10-CM

## 2021-03-14 NOTE — Therapy (Signed)
[place] MAIN [facility] SERVICES [address] [place], [place], [address] Phone: [phone]   Fax:  [phone]  Physical Therapy Treatment/ Progress Note   Dates of reporting period  01/22/2021   to   03/14/2021   Patient Details  Name: Curtis Powell MRN: [id number] Date of Birth: 02/15/1966 Referring Provider (PT): Kym Groom MD   Encounter Date: 03/14/2021   PT End of Session - 03/14/21 1409     Visit Number 40    Number of Visits 42    Date for PT Re-Evaluation 03/19/21    Authorization Type UHC Medicare; [place] Medicaid    Authorization Time Period 9/10 10/31    PT Start Time 1300    PT Stop Time 1347    PT Time Calculation (min) 47 min    Equipment Utilized During Treatment Gait belt    Activity Tolerance Patient limited by fatigue    Behavior During Therapy WFL for tasks assessed/performed             Past Medical History:  Diagnosis Date   GERD (gastroesophageal reflux disease)    Hyperlipidemia    Hypertension    Obesity    Stroke [facility])     Past Surgical History:  Procedure Laterality Date   BRAIN SURGERY      There were no vitals filed for this visit.   Subjective Assessment - 03/14/21 1259     Subjective Patient reports he walked at home, no falls of LOB since last session.    Pertinent History Patient is a pleasant 55 year old male who presents to physical therapy for weakness and immobility secondary to CVA.  Had a stroke in Feb 2018. He was previously fully independent, but this stroke caused severe residual deficits, mainly on the right side. He reports difficulty walking and decreased mobility. His mom had to move in with him and now is his main caregiver.  Patient had outpatient PT from November 2018-March 2022. Patient reports since being discharged from outpatient therapy he has kept up with his HEP daily. He does report desire for walking better and for getting in and out of bed easier. He reports his mom is  helping with some ADLs. He reports needing help with rolling in bed. He reports his mom/aide assist with dressing. He has a home aide 3 days out of the week;    Limitations Standing;Walking    How long can you sit comfortably? NA    How long can you stand comfortably? <30 min, does minimal standing;    How long can you walk comfortably? depends on the day, fatigue affects his walking; He is walking every day, about 15-20 min;    Diagnostic tests None recent    Patient Stated Goals to walk better and be independent in bed mobility;             5XSTS: 20.21s SUE on chair, SUE on RW.  10MWT: 1.11.92s self selected= 0.36ms; 51.53s fast = 0.151m  BLE strength    Left Right  Hip flexion 4 4-  Hip Abduction 4 3+  Hip Adduction 4- 3+  Knee Extension  4 4-  Knee Flexion 4- 3+  DF 4- 3  PF 4- 3    Ambulate 250' : Patient ambulated with minA, gait belt, and pediatric bariatric RW. Patient had LOB from RLE leg weakness  and buckling resulting in controlled fall to ground at 170 ft. Pt landed on right side, reporting mild discomfort in  elbow that resolved by end of session. Pt did not hit head or lose consciousness. Required max A x2 to transfer to sitting in WC. Patient denied dizziness, headache, or pain by end of session. Safety portal entered and manager notified.   - PT attempted to call patient and patient's parent once at  home to check on patient status; left VM.   Toileting (void in standing) performed 1x at end of session with patient requiring Supervision<>min A with donning doffing pants, stabilizing self, and washing of hands. Pt held onto grab bars for safety and stabilization.   Stand pivot transfer w/c<>chair. Difficulty with pivot steps, maneuvering WC (PT assisted) and foot clearance. PT directed for safe alignment to sitting surface.   Wheeling into/out of bathroom with focus on lip, requires assistance for entering bathroom, supervision only for exiting.    Pt  educated throughout session about proper posture and technique with exercises. Improved exercise technique, movement at target joints, use of target muscles after min to mod verbal, visual, tactile cues.   Patient's condition has the potential to improve in response to therapy. Maximum improvement is yet to be obtained. The anticipated improvement is attainable and reasonable in a generally predictable time. Despite increased fatigue today, patient has demonstrated improvements in gait speed as well as progress towards LE strength goals. Patient will continue to benefit from skilled PT to address gait deviations, strength, and balance to progress towards goals and improve independence with ADLs and mobility.                              PT Short Term Goals - 03/14/21 1303       PT SHORT TERM GOAL #1   Title Patient will be adherent to HEP at least 3x a week to improve functional strength and balance for better safety at home.    Baseline 11/15/2020- Patient reports performing seated LE hip march, knee ext, sit to stand and walking daily and states no questions. Advised patient to practice static standing using his walker or kitchen counter several times per day without UE support.    Time 4    Period Weeks    Status Achieved    Target Date 11/01/20      PT SHORT TERM GOAL #2   Title Patient will be independent in bed mobility including sit to supine utilizing bed rails as needed for increased mobility at home.    Baseline 6/29: requires min A 8/24: patient reports needing mod A 12/7: patient reports needing min<>mod A    Time 4    Period Weeks    Status On-going    Target Date 02/19/21               PT Long Term Goals - 03/14/21 1305       PT LONG TERM GOAL #1   Title Patient will improve FOTO score to > 50% to indicate improve functional mobility at home and in community.    Baseline 6/29: 42% 8/24: 40% 10/17: 40%    Time 8    Period Weeks    Status  On-going    Target Date 03/19/21      PT LONG TERM GOAL #2   Title Patient (< 54 years old) will complete five times sit to stand test in < 20 seconds indicating an increased LE strength and improved balance.    Baseline 6/29: 23.8 sec with 2HHA. 11/15/2020= 48 sec  with BUE Support and CGA using gait belt. 8/24: 23 seconds with 2 HHA 10/17: 19.23 seconds with BUE support 12/7: 20.21s BUE    Time 8    Period Weeks    Status Achieved      PT LONG TERM GOAL #3   Title Patient will increase 10 meter walk test to >0.15 m/s (60 sec or less) as to improve gait speed for better home ambulation and to reduce fall risk.    Baseline 6/29: 0.08 m/s with RW. 11/15/2020= 0.093 m/s with RW 8/24: 0.15 m/s 12/7= 0.73m/s with RW    Time 8    Period Weeks    Status Achieved      PT LONG TERM GOAL #4   Title Patient will increase BLE gross strength to 4+/5 as to improve functional strength for independent gait, increased standing tolerance and increased ADL ability.    Baseline 6/29: see flowsheet. 8/24: hips grossly 4-/5 with hip extension 2+/5; L knee flexion 2+/5 knee extension 4/5; R knee 3+/5 10/17: see note 12/7: see note    Time 8    Period Weeks    Status Partially Met    Target Date 03/19/21      PT LONG TERM GOAL #5   Title Patient will ambulate 250 ft with least assistive device and Supervision with no breaks to allow for increased mobility within home.    Baseline 11/15/2020-Ambulation distance = approx 110 feet using pediatric bariatric walker 8/24: 155 ft with bariatric RW 10/17: 170 ft with bariatric RW 12/7: 181ft with bariatric RW, see note    Time 8    Period Weeks    Status Partially Met    Target Date 03/19/21      PT LONG TERM GOAL #6   Title Patient will increase 10 meter walk test to >0.5 m/s ( 30 seconds) as to improve gait speed for better home ambulation and to reduce fall risk.    Baseline 8/24: 61 seconds: 0.15 m/s 10/17: 64 seconds ~0.15 m/s 12/7: 0.93m/s (51.5s)    Time 8     Period Weeks    Status Partially Met    Target Date 03/19/21      PT LONG TERM GOAL #7   Title Patient (< 72 years old) will complete five times sit to stand test in < 20 seconds with SUE support  indicating an increased LE strength and improved balance.    Baseline 10/17: 19.23 seconds with BUE support 12/7: 20.21s with SUE on RW, SUE on chair    Time 8    Period Weeks    Status On-going    Target Date 03/19/21                   Plan - 03/14/21 1409     Clinical Impression Statement Patient's condition has the potential to improve in response to therapy. Maximum improvement is yet to be obtained. The anticipated improvement is attainable and reasonable in a generally predictable time. Despite increased fatigue today, patient has demonstrated improvements in gait speed as well as progress towards LE strength goals. Largest deficits remaining include gait endurance, reliance on AD, RLE weakness, and balance. Patient will continue to benefit from skilled PT to address gait deviations, strength, and balance to progress towards goals and improve independence with ADLs and mobility.    Personal Factors and Comorbidities Comorbidity 3+;Education;Past/Current Experience;Time since onset of injury/illness/exacerbation;Transportation    Comorbidities HTN, arthritis, past CVA, high fall risk, obesity  Examination-Activity Limitations Bed Mobility;Locomotion Level;Squat;Stairs;Stand;Toileting;Transfers    Examination-Participation Restrictions Community Activity;Driving;Cleaning;Meal Prep;Shop;Volunteer;Yard Work    Stability/Clinical Decision Making Stable/Uncomplicated    Rehab Potential Good    PT Frequency 2x / week    PT Duration 8 weeks    PT Treatment/Interventions ADLs/Self Care Home Management;Cryotherapy;Moist Heat;Gait training;Stair training;Functional mobility training;Therapeutic activities;Therapeutic exercise;Balance training;Neuromuscular re-education;Patient/family  education;Orthotic Fit/Training;Passive range of motion;Energy conservation    PT Next Visit Plan Continue seated dynadisc balance. Trial lumbar roll in WC    PT Home Exercise Plan no updates  this session; Reminder to patient to continue with daily walking    Consulted and Agree with Plan of Care Patient             Patient will benefit from skilled therapeutic intervention in order to improve the following deficits and impairments:  Abnormal gait, Decreased balance, Decreased endurance, Decreased mobility, Difficulty walking, Hypomobility, Obesity, Improper body mechanics, Decreased activity tolerance, Decreased strength, Postural dysfunction  Visit Diagnosis: Abnormality of gait and mobility  Unsteadiness on feet  Difficulty in walking, not elsewhere classified     Problem List Patient Active Problem List   Diagnosis Date Noted   Impaired glucose tolerance 03/23/2020   Learning disability 03/23/2020   Urinary and fecal incontinence 06/14/2019   Chronic pain syndrome 03/23/2019   Constipation 03/23/2019   Dysarthria 03/23/2019   Dysphagia 03/23/2019   Learning difficulty 03/23/2019   Morbid obesity ([place]) 03/23/2019   Muscle weakness 03/23/2019   Vitamin D deficiency 03/23/2019   Bilateral carotid artery stenosis 12/15/2018   Moderate aortic valve stenosis 12/15/2018   BMI 45.0-49.9, adult ([place]) 11/10/2018   Cerebrovascular accident ([place]) 05/02/2016   Hemiparesis affecting right side as late effect of cerebrovascular accident ([place]) 05/01/2016   Brain tumor ([place]) 11/24/2014   Gastro-esophageal reflux disease without esophagitis 11/24/2014   Accident due to mechanical fall without injury 08/03/2014   Essential hypertension 06/13/2014   Mixed hyperlipidemia 06/13/2014   Unspecified sequelae of cerebral infarction 06/13/2014   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/2014   Type 2 diabetes mellitus without complication ([place]) 93/55/2174   Neck mass 09/08/2013   Swimmer's ear 09/08/2013    Erectile dysfunction 01/03/2013   Prediabetes 01/03/2013   Arsenio Katz, SPT This entire session was performed under direct supervision and direction of a licensed therapist/therapist assistant . I have personally read, edited and approve of the note as written.  Janna Arch, PT, DPT  03/14/2021, 4:32 PM  [place] MAIN [facility] SERVICES [address] [place], [place], [address] Phone: [phone]   Fax:  [phone]  Name: Curtis Powell MRN: [id number] Date of Birth: October 12, 1965

## 2021-03-19 ENCOUNTER — Other Ambulatory Visit: Payer: Self-pay

## 2021-03-19 ENCOUNTER — Ambulatory Visit: Payer: Medicare Other

## 2021-03-19 DIAGNOSIS — R2681 Unsteadiness on feet: Secondary | ICD-10-CM

## 2021-03-19 DIAGNOSIS — R269 Unspecified abnormalities of gait and mobility: Secondary | ICD-10-CM | POA: Diagnosis not present

## 2021-03-19 DIAGNOSIS — R262 Difficulty in walking, not elsewhere classified: Secondary | ICD-10-CM

## 2021-03-19 NOTE — Therapy (Signed)
[place] MAIN [facility] SERVICES [address] [place], [place], [address] Phone: [phone]   Fax:  [phone]  Physical Therapy Treatment/ RE-CERT  Patient Details  Name: Curtis Powell MRN: [id number] Date of Birth: 1965/12/29 Referring Provider (PT): Kym Groom MD   Encounter Date: 03/19/2021   PT End of Session - 03/19/21 1450     Visit Number 41    Number of Visits 98    Date for PT Re-Evaluation 05/14/21    Authorization Type [employer] Medicare; [place] Medicaid    Authorization Time Period 9/10 10/31    PT Start Time 1352    PT Stop Time 1435    PT Time Calculation (min) 43 min    Equipment Utilized During Treatment Gait belt    Activity Tolerance Patient limited by fatigue;Patient tolerated treatment well    Behavior During Therapy WFL for tasks assessed/performed             Past Medical History:  Diagnosis Date   GERD (gastroesophageal reflux disease)    Hyperlipidemia    Hypertension    Obesity    Stroke [facility])     Past Surgical History:  Procedure Laterality Date   BRAIN SURGERY      There were no vitals filed for this visit.   Subjective Assessment - 03/19/21 1355     Subjective Patient reports walking on Saturday and Sunday at home, denies falls or LOB. Reports "I feel pretty alright"    Pertinent History Patient is a pleasant 55 year old male who presents to physical therapy for weakness and immobility secondary to CVA.  Had a stroke in Feb 2018. He was previously fully independent, but this stroke caused severe residual deficits, mainly on the right side. He reports difficulty walking and decreased mobility. His mom had to move in with him and now is his main caregiver.  Patient had outpatient PT from November 2018-March 2022. Patient reports since being discharged from outpatient therapy he has kept up with his HEP daily. He does report desire for walking better and for getting in and out of bed easier. He reports his mom is  helping with some ADLs. He reports needing help with rolling in bed. He reports his mom/aide assist with dressing. He has a home aide 3 days out of the week;    Limitations Standing;Walking    How long can you sit comfortably? NA    How long can you stand comfortably? <30 min, does minimal standing;    How long can you walk comfortably? depends on the day, fatigue affects his walking; He is walking every day, about 15-20 min;    Diagnostic tests None recent    Patient Stated Goals to walk better and be independent in bed mobility;              FOTO: 46% TherEx In // bars, with CGA: Static standing, cuing for upright posture, BUE support. 3x45sec wach LE  Standing at Pediatric, Bariatric RW: Alternating marches focusing on weight shifting and maintaining upright posture. 1x20  Seated: SL Hip adduction marches with GTB 1x10 each LE Hip adduction ball squeezes with 5sec holds, 1x10 Thoracic extension x12  Ambulation with pediatric, bariatric RW and CGA<>min A: 34fx2 with tactile cuing at glutes by SPT to promote weight shift and hip extension during stance. Post walk HR: 108bpm  TherAc:  Toileting (void in standing) performed 1x at end of session with patient requiring assistance with donning doffing pants, stabilizing self,  and washing of hands. Pt held onto grab bars for safety and stabilization.   Supervision only needed by SPT for door management to bathroom.    Pt demonstrated improved independence with navigating WC in bathroom and ability to navigate lip on floor at entrance.   Pt educated throughout session about proper posture and technique with exercises. Improved exercise technique, movement at target joints, use of target muscles after min to mod verbal, visual, tactile cues.   Patient arrives to PT late however demonstrates excellent motivation throughout. Patient is making progress towards goals; FOTO score, LE strength, and ability to perform STS with decreased  UE support have improved. Patient presented with increased fatigue on day of testing; significantly limiting performance on outcome measures, therefore will request trial re-certification period of time. Patient will continue to benefit from skilled PT to improve balance, strength and endurance to increase independence with ADLs and functional mobility.                     PT Education - 03/19/21 1450     Education Details body mechanics, exercise technique    Person(s) Educated Patient    Methods Explanation;Demonstration;Tactile cues;Verbal cues    Comprehension Returned demonstration;Verbal cues required;Verbalized understanding;Tactile cues required              PT Short Term Goals - 03/19/21 1458       PT SHORT TERM GOAL #1   Title Patient will be adherent to HEP at least 3x a week to improve functional strength and balance for better safety at home.    Baseline 11/15/2020- Patient reports performing seated LE hip march, knee ext, sit to stand and walking daily and states no questions. Advised patient to practice static standing using his walker or kitchen counter several times per day without UE support.    Time 4    Period Weeks    Status Achieved    Target Date 11/01/20      PT SHORT TERM GOAL #2   Title Patient will be independent in bed mobility including sit to supine utilizing bed rails as needed for increased mobility at home.    Baseline 6/29: requires min A 8/24: patient reports needing mod A 12/7: patient reports needing min<>mod A    Time 4    Period Weeks    Status On-going    Target Date 04/16/21               PT Long Term Goals - 03/19/21 1452       PT LONG TERM GOAL #1   Title Patient will improve FOTO score to > 50% to indicate improve functional mobility at home and in community.    Baseline 6/29: 42% 8/24: 40% 10/17: 40% 12/12: 46%    Time 8    Period Weeks    Status On-going    Target Date 05/14/21      PT LONG TERM GOAL #2    Title Patient (< 40 years old) will complete five times sit to stand test in < 20 seconds indicating an increased LE strength and improved balance.    Baseline 6/29: 23.8 sec with 2HHA. 11/15/2020= 48 sec with BUE Support and CGA using gait belt. 8/24: 23 seconds with 2 HHA 10/17: 19.23 seconds with BUE support 12/7: 20.21s BUE    Time 8    Period Weeks    Status On-going    Target Date 05/14/21      PT LONG TERM  GOAL #3   Title Patient will increase 10 meter walk test to >0.15 m/s (60 sec or less) as to improve gait speed for better home ambulation and to reduce fall risk.    Baseline 6/29: 0.08 m/s with RW. 11/15/2020= 0.093 m/s with RW 8/24: 0.15 m/s 12/7= 0.18ms with RW    Time 8    Period Weeks    Status Achieved      PT LONG TERM GOAL #4   Title Patient will increase BLE gross strength to 4+/5 as to improve functional strength for independent gait, increased standing tolerance and increased ADL ability.    Baseline 6/29: see flowsheet. 8/24: hips grossly 4-/5 with hip extension 2+/5; L knee flexion 2+/5 knee extension 4/5; R knee 3+/5 10/17: see note 12/7: see note    Time 8    Period Weeks    Status Partially Met    Target Date 05/14/21      PT LONG TERM GOAL #5   Title Patient will ambulate 250 ft with least assistive device and Supervision with no breaks to allow for increased mobility within home.    Baseline 11/15/2020-Ambulation distance = approx 110 feet using pediatric bariatric walker 8/24: 155 ft with bariatric RW 10/17: 170 ft with bariatric RW 12/7: 1768fwith bariatric RW, see note    Time 8    Period Weeks    Status Partially Met    Target Date 05/14/21      PT LONG TERM GOAL #6   Title Patient will increase 10 meter walk test to >0.5 m/s ( 30 seconds) as to improve gait speed for better home ambulation and to reduce fall risk.    Baseline 8/24: 61 seconds: 0.15 m/s 10/17: 64 seconds ~0.15 m/s 12/7: 0.192m(51.5s)    Time 8    Period Weeks    Status Partially  Met    Target Date 05/14/21      PT LONG TERM GOAL #7   Title Patient (< 60 84ars old) will complete five times sit to stand test in < 20 seconds with SUE support  indicating an increased LE strength and improved balance.    Baseline 10/17: 19.23 seconds with BUE support 12/7: 20.21s with SUE on RW, SUE on chair    Time 8    Period Weeks    Status On-going    Target Date 05/14/21                   Plan - 03/19/21 1451     Clinical Impression Statement Patient arrives to PT late however demonstrates excellent motivation throughout. Patient is making progress towards goals; FOTO score, LE strength, and ability to perform STS with decreased UE support have improved. Patient presented with increased fatigue on day of testing; significantly limiting performance on outcome measures, therefore will request trial re-certification period of time. Patient will continue to benefit from skilled PT to improve balance, strength and endurance to increase independence with ADLs and functional mobility.    Personal Factors and Comorbidities Comorbidity 3+;Education;Past/Current Experience;Time since onset of injury/illness/exacerbation;Transportation    Comorbidities HTN, arthritis, past CVA, high fall risk, obesity    Examination-Activity Limitations Bed Mobility;Locomotion Level;Squat;Stairs;Stand;Toileting;Transfers    Examination-Participation Restrictions Community Activity;Driving;Cleaning;Meal Prep;Shop;Volunteer;Yard Work    Stability/Clinical Decision Making Stable/Uncomplicated    Rehab Potential Good    PT Frequency 2x / week    PT Duration 8 weeks    PT Treatment/Interventions ADLs/Self Care Home Management;Cryotherapy;Moist Heat;Gait training;Stair training;Functional mobility training;Therapeutic activities;Therapeutic  exercise;Balance training;Neuromuscular re-education;Patient/family education;Orthotic Fit/Training;Passive range of motion;Energy conservation    PT Next Visit Plan  Continue seated dynadisc balance. Trial lumbar roll in WC    PT Home Exercise Plan no updates  this session; Reminder to patient to continue with daily walking    Consulted and Agree with Plan of Care Patient             Patient will benefit from skilled therapeutic intervention in order to improve the following deficits and impairments:  Abnormal gait, Decreased balance, Decreased endurance, Decreased mobility, Difficulty walking, Hypomobility, Obesity, Improper body mechanics, Decreased activity tolerance, Decreased strength, Postural dysfunction  Visit Diagnosis: Abnormality of gait and mobility  Unsteadiness on feet  Difficulty in walking, not elsewhere classified     Problem List Patient Active Problem List   Diagnosis Date Noted   Impaired glucose tolerance 03/23/2020   Learning disability 03/23/2020   Urinary and fecal incontinence 06/14/2019   Chronic pain syndrome 03/23/2019   Constipation 03/23/2019   Dysarthria 03/23/2019   Dysphagia 03/23/2019   Learning difficulty 03/23/2019   Morbid obesity ([place]) 03/23/2019   Muscle weakness 03/23/2019   Vitamin D deficiency 03/23/2019   Bilateral carotid artery stenosis 12/15/2018   Moderate aortic valve stenosis 12/15/2018   BMI 45.0-49.9, adult ([place]) 11/10/2018   Cerebrovascular accident ([place]) 05/02/2016   Hemiparesis affecting right side as late effect of cerebrovascular accident (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 I have personally read, edited and approve of the note as written.  Janna Arch, PT, DPT  03/19/2021, 4:50 PM  [place] MAIN [facility] SERVICES [address] [place], [place], [address] Phone: [phone]   Fax:  [phone]  Name: Curtis Powell MRN: [id number] Date of Birth: 1965-08-16

## 2021-03-21 ENCOUNTER — Other Ambulatory Visit: Payer: Self-pay

## 2021-03-21 ENCOUNTER — Ambulatory Visit: Payer: Medicare Other

## 2021-03-21 DIAGNOSIS — R269 Unspecified abnormalities of gait and mobility: Secondary | ICD-10-CM | POA: Diagnosis not present

## 2021-03-21 DIAGNOSIS — R278 Other lack of coordination: Secondary | ICD-10-CM

## 2021-03-21 DIAGNOSIS — M6281 Muscle weakness (generalized): Secondary | ICD-10-CM

## 2021-03-21 NOTE — Therapy (Signed)
[place] MAIN [facility] SERVICES [address] [place], [place], [address] Phone: [phone]   Fax:  [phone]  Physical Therapy Treatment  Patient Details  Name: Curtis Powell MRN: [id number] Date of Birth: [birth year] Referring Provider (PT): Kym Groom MD   Encounter Date: 03/21/2021    Past Medical History:  Diagnosis Date   GERD (gastroesophageal reflux disease)    Hyperlipidemia    Hypertension    Obesity    Stroke [facility])     Past Surgical History:  Procedure Laterality Date   BRAIN SURGERY      There were no vitals filed for this visit.   Subjective Assessment - 03/21/21 1344     Subjective Patient reports feeling okay and was able to walk at home with no falls since last visit.    Pertinent History Patient is a pleasant 55 year old male who presents to physical therapy for weakness and immobility secondary to CVA.  Had a stroke in Feb 2018. He was previously fully independent, but this stroke caused severe residual deficits, mainly on the right side. He reports difficulty walking and decreased mobility. His mom had to move in with him and now is his main caregiver.  Patient had outpatient PT from November 2018-March 2022. Patient reports since being discharged from outpatient therapy he has kept up with his HEP daily. He does report desire for walking better and for getting in and out of bed easier. He reports his mom is helping with some ADLs. He reports needing help with rolling in bed. He reports his mom/aide assist with dressing. He has a home aide 3 days out of the week;    Limitations Standing;Walking    How long can you sit comfortably? NA    How long can you stand comfortably? <30 min, does minimal standing;    How long can you walk comfortably? depends on the day, fatigue affects his walking; He is walking every day, about 15-20 min;    Diagnostic tests None recent    Patient Stated Goals to walk better and be independent in  bed mobility;             TherEx:  In // bars:  Forwards backwards walking length of bars 3x2  SL toe taps to 4in step x10 each LE. Toe drag bilaterally especially when stepping down, more difficulty on RLE.  SL stance with SUE support 4x15sec each LE, very challenging on RLE requiring frequent toe touchdowns.  Side stepping length of bars x4, tactile cuing at glutes for weight shifting, BUE support at bars  Seated GTB hamstring curls 1x10 each LE  Seated hip adduction ball squeezes x10 with 5sec holds  Seated Thoracic extension x20  Seated on dynadisc: Thoracic rotations x10 each side AAROM SL marches 2x10 each LE, SPT providing modA for greater hip flexion range while patient maintained upright posture.   TherAc:   Toileting (void in standing) performed 1x at end of session with patient requiring assistance with donning doffing pants, stabilizing self, and washing of hands. Pt held onto grab bars for safety and stabilization.   Supervision only needed by SPT for door management to bathroom and fully donning pants after voiding.   Pt demonstrated improved independence with navigating WC in bathroom and ability to navigate lip on floor at entrance.    Pt educated throughout session about proper posture and technique with exercises. Improved exercise technique, movement at target joints, use of target muscles after min to mod  verbal, visual, tactile cues.   Patient with good effort throughout PT today. Patient demonstrated improved core strength and stability during dynadisc TherEx. Patient continues to be challenged by SL stance with decreasing UE support to work towards increased independence with toileting and ADLs. Patient will continue to benefit from skilled PT to improve strength, posture, gait mechanics, and stability to increase functional mobility and independence with ADLs.                    PT Education - 03/21/21 1439     Education Details  body mechanics, exercise technique    Person(s) Educated Patient    Methods Explanation;Demonstration;Tactile cues;Verbal cues    Comprehension Verbal cues required;Returned demonstration;Tactile cues required;Verbalized understanding              PT Short Term Goals - 03/19/21 1458       PT SHORT TERM GOAL #1   Title Patient will be adherent to HEP at least 3x a week to improve functional strength and balance for better safety at home.    Baseline 11/15/2020- Patient reports performing seated LE hip march, knee ext, sit to stand and walking daily and states no questions. Advised patient to practice static standing using his walker or kitchen counter several times per day without UE support.    Time 4    Period Weeks    Status Achieved    Target Date 11/01/20      PT SHORT TERM GOAL #2   Title Patient will be independent in bed mobility including sit to supine utilizing bed rails as needed for increased mobility at home.    Baseline 6/29: requires min A 8/24: patient reports needing mod A 12/7: patient reports needing min<>mod A    Time 4    Period Weeks    Status On-going    Target Date 04/16/21               PT Long Term Goals - 03/19/21 1452       PT LONG TERM GOAL #1   Title Patient will improve FOTO score to > 50% to indicate improve functional mobility at home and in community.    Baseline 6/29: 42% 8/24: 40% 10/17: 40% 12/12: 46%    Time 8    Period Weeks    Status On-going    Target Date 05/14/21      PT LONG TERM GOAL #2   Title Patient (< 38 years old) will complete five times sit to stand test in < 20 seconds indicating an increased LE strength and improved balance.    Baseline 6/29: 23.8 sec with 2HHA. 11/15/2020= 48 sec with BUE Support and CGA using gait belt. 8/24: 23 seconds with 2 HHA 10/17: 19.23 seconds with BUE support 12/7: 20.21s BUE    Time 8    Period Weeks    Status On-going    Target Date 05/14/21      PT LONG TERM GOAL #3   Title  Patient will increase 10 meter walk test to >0.15 m/s (60 sec or less) as to improve gait speed for better home ambulation and to reduce fall risk.    Baseline 6/29: 0.08 m/s with RW. 11/15/2020= 0.093 m/s with RW 8/24: 0.15 m/s 12/7= 0.70ms with RW    Time 8    Period Weeks    Status Achieved      PT LONG TERM GOAL #4   Title Patient will increase BLE gross strength to  4+/5 as to improve functional strength for independent gait, increased standing tolerance and increased ADL ability.    Baseline 6/29: see flowsheet. 8/24: hips grossly 4-/5 with hip extension 2+/5; L knee flexion 2+/5 knee extension 4/5; R knee 3+/5 10/17: see note 12/7: see note    Time 8    Period Weeks    Status Partially Met    Target Date 05/14/21      PT LONG TERM GOAL #5   Title Patient will ambulate 250 ft with least assistive device and Supervision with no breaks to allow for increased mobility within home.    Baseline 11/15/2020-Ambulation distance = approx 110 feet using pediatric bariatric walker 8/24: 155 ft with bariatric RW 10/17: 170 ft with bariatric RW 12/7: 16f with bariatric RW, see note    Time 8    Period Weeks    Status Partially Met    Target Date 05/14/21      PT LONG TERM GOAL #6   Title Patient will increase 10 meter walk test to >0.5 m/s ( 30 seconds) as to improve gait speed for better home ambulation and to reduce fall risk.    Baseline 8/24: 61 seconds: 0.15 m/s 10/17: 64 seconds ~0.15 m/s 12/7: 0.173m (51.5s)    Time 8    Period Weeks    Status Partially Met    Target Date 05/14/21      PT LONG TERM GOAL #7   Title Patient (< 6017ears old) will complete five times sit to stand test in < 20 seconds with SUE support  indicating an increased LE strength and improved balance.    Baseline 10/17: 19.23 seconds with BUE support 12/7: 20.21s with SUE on RW, SUE on chair    Time 8    Period Weeks    Status On-going    Target Date 05/14/21                   Plan - 03/21/21 1437      Clinical Impression Statement Patient with good effort throughout PT today. Patient demonstrated improved core strength and stability during dynadisc TherEx. Patient continues to be challenged by SL stance with decreasing UE support to work towards increased independence with toileting and ADLs. Patient will continue to benefit from skilled PT to improve strength, posture, gait mechanics, and stability to increase functional mobility and independence with ADLs.    Personal Factors and Comorbidities Comorbidity 3+;Education;Past/Current Experience;Time since onset of injury/illness/exacerbation;Transportation    Comorbidities HTN, arthritis, past CVA, high fall risk, obesity    Examination-Activity Limitations Bed Mobility;Locomotion Level;Squat;Stairs;Stand;Toileting;Transfers    Examination-Participation Restrictions Community Activity;Driving;Cleaning;Meal Prep;Shop;Volunteer;Yard Work    Stability/Clinical Decision Making Stable/Uncomplicated    Rehab Potential Good    PT Frequency 2x / week    PT Duration 8 weeks    PT Treatment/Interventions ADLs/Self Care Home Management;Cryotherapy;Moist Heat;Gait training;Stair training;Functional mobility training;Therapeutic activities;Therapeutic exercise;Balance training;Neuromuscular re-education;Patient/family education;Orthotic Fit/Training;Passive range of motion;Energy conservation    PT Next Visit Plan Continue seated dynadisc balance. Trial lumbar roll in WC    PT Home Exercise Plan no updates  this session; Reminder to patient to continue with daily walking    Consulted and Agree with Plan of Care Patient             Patient will benefit from skilled therapeutic intervention in order to improve the following deficits and impairments:  Abnormal gait, Decreased balance, Decreased endurance, Decreased mobility, Difficulty walking, Hypomobility, Obesity, Improper body mechanics, Decreased activity tolerance,  Decreased strength, Postural  dysfunction  Visit Diagnosis: Abnormality of gait and mobility  Other lack of coordination  Muscle weakness (generalized)     Problem List Patient Active Problem List   Diagnosis Date Noted   Impaired glucose tolerance 03/23/2020   Learning disability 03/23/2020   Urinary and fecal incontinence 06/14/2019   Chronic pain syndrome 03/23/2019   Constipation 03/23/2019   Dysarthria 03/23/2019   Dysphagia 03/23/2019   Learning difficulty 03/23/2019   Morbid obesity ([place]) 03/23/2019   Muscle weakness 03/23/2019   Vitamin D deficiency 03/23/2019   Bilateral carotid artery stenosis 12/15/2018   Moderate aortic valve stenosis 12/15/2018   BMI 45.0-49.9, adult ([place]) 11/10/2018   Cerebrovascular accident ([place]) 05/02/2016   Hemiparesis affecting right side as late effect of cerebrovascular accident ([place]) 05/01/2016   Brain tumor ([place]) 11/24/2014   Gastro-esophageal reflux disease without esophagitis 11/24/2014   Accident due to mechanical fall without injury 08/03/2014   Essential hypertension 06/13/2014   Mixed hyperlipidemia 06/13/2014   Unspecified sequelae of cerebral infarction 06/13/2014   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/2014   Type 2 diabetes mellitus without complication ([place]) 16/96/7893   Neck mass 09/08/2013   Swimmer's ear 09/08/2013   Erectile dysfunction 01/03/2013   Prediabetes 01/03/2013   Arsenio Katz, SPT  This entire session was performed under direct supervision and direction of a licensed therapist/therapist assistant . I have personally read, edited and approve of the note as written.  Janna Arch, PT, DPT  03/21/2021, 2:40 PM  [place] MAIN [facility] SERVICES [address] [place], [place], [address] Phone: [phone]   Fax:  [phone]  Name: Curtis Powell MRN: [id number] Date of Birth: Dec 23, 1965

## 2021-03-26 ENCOUNTER — Other Ambulatory Visit: Payer: Self-pay

## 2021-03-26 ENCOUNTER — Ambulatory Visit: Payer: Medicare Other

## 2021-03-26 DIAGNOSIS — R2681 Unsteadiness on feet: Secondary | ICD-10-CM

## 2021-03-26 DIAGNOSIS — M6281 Muscle weakness (generalized): Secondary | ICD-10-CM

## 2021-03-26 DIAGNOSIS — R269 Unspecified abnormalities of gait and mobility: Secondary | ICD-10-CM | POA: Diagnosis not present

## 2021-03-26 NOTE — Therapy (Signed)
[place] MAIN [facility] SERVICES [address] [place], [place], [address] Phone: [phone]   Fax:  [phone]  Physical Therapy Treatment  Patient Details  Name: Curtis Powell MRN: [id number] Date of Birth: 1965-07-26 Referring Provider (PT): Kym Groom MD   Encounter Date: 03/26/2021   PT End of Session - 03/26/21 1333     Visit Number 43    Number of Visits 89    Date for PT Re-Evaluation 05/14/21    Authorization Type UHC Medicare; [place] Medicaid    Authorization Time Period 9/10 10/31    PT Start Time 1345    PT Stop Time 1429    PT Time Calculation (min) 44 min    Equipment Utilized During Treatment Gait belt    Activity Tolerance Patient limited by fatigue;Patient tolerated treatment well    Behavior During Therapy WFL for tasks assessed/performed             Past Medical History:  Diagnosis Date   GERD (gastroesophageal reflux disease)    Hyperlipidemia    Hypertension    Obesity    Stroke [facility])     Past Surgical History:  Procedure Laterality Date   BRAIN SURGERY      There were no vitals filed for this visit.   Subjective Assessment - 03/26/21 1411     Subjective Patient is awaiting new AFO order due to misfitting AFO causing discomfort. Patient reports he did some walking over the weekend time with his caregiver.    Pertinent History Patient is a pleasant 55 year old male who presents to physical therapy for weakness and immobility secondary to CVA.  Had a stroke in Feb 2018. He was previously fully independent, but this stroke caused severe residual deficits, mainly on the right side. He reports difficulty walking and decreased mobility. His mom had to move in with him and now is his main caregiver.  Patient had outpatient PT from November 2018-March 2022. Patient reports since being discharged from outpatient therapy he has kept up with his HEP daily. He does report desire for walking better and for getting in and out  of bed easier. He reports his mom is helping with some ADLs. He reports needing help with rolling in bed. He reports his mom/aide assist with dressing. He has a home aide 3 days out of the week;    Limitations Standing;Walking    How long can you sit comfortably? NA    How long can you stand comfortably? <30 min, does minimal standing;    How long can you walk comfortably? depends on the day, fatigue affects his walking; He is walking every day, about 15-20 min;    Diagnostic tests None recent    Patient Stated Goals to walk better and be independent in bed mobility;    Currently in Pain? No/denies               Patient is awaiting new AFO order due to misfitting AFO causing discomfort.   TherEx:   In // bars:   Ambulate 100 ft with bRW, CGA, and wheelchair follow frequent cueing for lifting feet  Standing at support bar reaching and spelling letters based on PT guidance x 4 minutes reaching inside/outside BOS  Sit to stand from wheelchair to walker 5x ; x 2 trials   static stand 60 seconds no UE support   Seated BTB hamstring curl 15x each LE    TherAc:     Toileting (void in  standing) performed 2x at end of session and beginning with patient requiring assistance with donning doffing pants, stabilizing self, and washing of hands. Pt held onto grab bars for safety and stabilization.   Supervision only needed by SPT for door management to bathroom and fully donning pants after voiding.   Pt demonstrated improved independence with navigating WC in bathroom and ability to navigate lip on floor at entrance.     Pt educated throughout session about proper posture and technique with exercises. Improved exercise technique, movement at target joints, use of target muscles after min to mod verbal, visual, tactile cues.     Patient has occasional LOB stumbling over LE's with ambulation requiring cueing for clearance of BLE. His heart rate is elevated throughout session requiring  occasional rest breaks to return to therapeutic level. Session limited by toileting needs twice performed this session. Patient will continue to benefit from skilled PT to improve strength, posture, gait mechanics, and stability to increase functional mobility and independence with ADLs.                  PT Education - 03/26/21 1332     Education Details exercise technique, body mechanics    Person(s) Educated Patient    Methods Explanation;Demonstration;Tactile cues;Verbal cues    Comprehension Verbalized understanding;Returned demonstration;Verbal cues required;Tactile cues required              PT Short Term Goals - 03/19/21 1458       PT SHORT TERM GOAL #1   Title Patient will be adherent to HEP at least 3x a week to improve functional strength and balance for better safety at home.    Baseline 11/15/2020- Patient reports performing seated LE hip march, knee ext, sit to stand and walking daily and states no questions. Advised patient to practice static standing using his walker or kitchen counter several times per day without UE support.    Time 4    Period Weeks    Status Achieved    Target Date 11/01/20      PT SHORT TERM GOAL #2   Title Patient will be independent in bed mobility including sit to supine utilizing bed rails as needed for increased mobility at home.    Baseline 6/29: requires min A 8/24: patient reports needing mod A 12/7: patient reports needing min<>mod A    Time 4    Period Weeks    Status On-going    Target Date 04/16/21               PT Long Term Goals - 03/19/21 1452       PT LONG TERM GOAL #1   Title Patient will improve FOTO score to > 50% to indicate improve functional mobility at home and in community.    Baseline 6/29: 42% 8/24: 40% 10/17: 40% 12/12: 46%    Time 8    Period Weeks    Status On-going    Target Date 05/14/21      PT LONG TERM GOAL #2   Title Patient (< 71 years old) will complete five times sit to stand  test in < 20 seconds indicating an increased LE strength and improved balance.    Baseline 6/29: 23.8 sec with 2HHA. 11/15/2020= 48 sec with BUE Support and CGA using gait belt. 8/24: 23 seconds with 2 HHA 10/17: 19.23 seconds with BUE support 12/7: 20.21s BUE    Time 8    Period Weeks    Status On-going  Target Date 05/14/21      PT LONG TERM GOAL #3   Title Patient will increase 10 meter walk test to >0.15 m/s (60 sec or less) as to improve gait speed for better home ambulation and to reduce fall risk.    Baseline 6/29: 0.08 m/s with RW. 11/15/2020= 0.093 m/s with RW 8/24: 0.15 m/s 12/7= 0.59ms with RW    Time 8    Period Weeks    Status Achieved      PT LONG TERM GOAL #4   Title Patient will increase BLE gross strength to 4+/5 as to improve functional strength for independent gait, increased standing tolerance and increased ADL ability.    Baseline 6/29: see flowsheet. 8/24: hips grossly 4-/5 with hip extension 2+/5; L knee flexion 2+/5 knee extension 4/5; R knee 3+/5 10/17: see note 12/7: see note    Time 8    Period Weeks    Status Partially Met    Target Date 05/14/21      PT LONG TERM GOAL #5   Title Patient will ambulate 250 ft with least assistive device and Supervision with no breaks to allow for increased mobility within home.    Baseline 11/15/2020-Ambulation distance = approx 110 feet using pediatric bariatric walker 8/24: 155 ft with bariatric RW 10/17: 170 ft with bariatric RW 12/7: 1747fwith bariatric RW, see note    Time 8    Period Weeks    Status Partially Met    Target Date 05/14/21      PT LONG TERM GOAL #6   Title Patient will increase 10 meter walk test to >0.5 m/s ( 30 seconds) as to improve gait speed for better home ambulation and to reduce fall risk.    Baseline 8/24: 61 seconds: 0.15 m/s 10/17: 64 seconds ~0.15 m/s 12/7: 0.1976m(51.5s)    Time 8    Period Weeks    Status Partially Met    Target Date 05/14/21      PT LONG TERM GOAL #7   Title  Patient (< 60 14ars old) will complete five times sit to stand test in < 20 seconds with SUE support  indicating an increased LE strength and improved balance.    Baseline 10/17: 19.23 seconds with BUE support 12/7: 20.21s with SUE on RW, SUE on chair    Time 8    Period Weeks    Status On-going    Target Date 05/14/21                   Plan - 03/26/21 1428     Clinical Impression Statement Patient has occasional LOB stumbling over LE's with ambulation requiring cueing for clearance of BLE. His heart rate is elevated throughout session requiring occasional rest breaks to return to therapeutic level. Session limited by toileting needs twice performed this session. Patient will continue to benefit from skilled PT to improve strength, posture, gait mechanics, and stability to increase functional mobility and independence with ADLs.    Personal Factors and Comorbidities Comorbidity 3+;Education;Past/Current Experience;Time since onset of injury/illness/exacerbation;Transportation    Comorbidities HTN, arthritis, past CVA, high fall risk, obesity    Examination-Activity Limitations Bed Mobility;Locomotion Level;Squat;Stairs;Stand;Toileting;Transfers    Examination-Participation Restrictions Community Activity;Driving;Cleaning;Meal Prep;Shop;Volunteer;Yard Work    Stability/Clinical Decision Making Stable/Uncomplicated    Rehab Potential Good    PT Frequency 2x / week    PT Duration 8 weeks    PT Treatment/Interventions ADLs/Self Care Home Management;Cryotherapy;Moist Heat;Gait training;Stair training;Functional mobility training;Therapeutic activities;Therapeutic exercise;Balance  training;Neuromuscular re-education;Patient/family education;Orthotic Fit/Training;Passive range of motion;Energy conservation    PT Next Visit Plan Continue seated dynadisc balance. Trial lumbar roll in WC    PT Home Exercise Plan no updates  this session; Reminder to patient to continue with daily walking     Consulted and Agree with Plan of Care Patient             Patient will benefit from skilled therapeutic intervention in order to improve the following deficits and impairments:  Abnormal gait, Decreased balance, Decreased endurance, Decreased mobility, Difficulty walking, Hypomobility, Obesity, Improper body mechanics, Decreased activity tolerance, Decreased strength, Postural dysfunction  Visit Diagnosis: Abnormality of gait and mobility  Muscle weakness (generalized)  Unsteadiness on feet     Problem List Patient Active Problem List   Diagnosis Date Noted   Impaired glucose tolerance 03/23/2020   Learning disability 03/23/2020   Urinary and fecal incontinence 06/14/2019   Chronic pain syndrome 03/23/2019   Constipation 03/23/2019   Dysarthria 03/23/2019   Dysphagia 03/23/2019   Learning difficulty 03/23/2019   Morbid obesity ([place]) 03/23/2019   Muscle weakness 03/23/2019   Vitamin D deficiency 03/23/2019   Bilateral carotid artery stenosis 12/15/2018   Moderate aortic valve stenosis 12/15/2018   BMI 45.0-49.9, adult ([place]) 11/10/2018   Cerebrovascular accident ([place]) 05/02/2016   Hemiparesis affecting right side as late effect of cerebrovascular accident ([place]) 05/01/2016   Brain tumor ([place]) 11/24/2014   Gastro-esophageal reflux disease without esophagitis 11/24/2014   Accident due to mechanical fall without injury 08/03/2014   Essential hypertension 06/13/2014   Mixed hyperlipidemia 06/13/2014   Unspecified sequelae of cerebral infarction 06/13/2014   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/2014   Type 2 diabetes mellitus without complication ([place]) 11/91/4782   Neck mass 09/08/2013   Swimmer's ear 09/08/2013   Erectile dysfunction 01/03/2013   Prediabetes 01/03/2013    Janna Arch, PT, DPT  03/26/2021, 2:30 PM  [place] MAIN [facility] SERVICES [address] [place], [place], [address] Phone: [phone]   Fax:  [phone]  Name: Curtis Powell MRN: [id number] Date of Birth: 1965/09/18

## 2021-03-28 ENCOUNTER — Other Ambulatory Visit: Payer: Self-pay

## 2021-03-28 ENCOUNTER — Ambulatory Visit: Payer: Medicare Other

## 2021-03-28 DIAGNOSIS — R2681 Unsteadiness on feet: Secondary | ICD-10-CM

## 2021-03-28 DIAGNOSIS — M6281 Muscle weakness (generalized): Secondary | ICD-10-CM

## 2021-03-28 DIAGNOSIS — R269 Unspecified abnormalities of gait and mobility: Secondary | ICD-10-CM

## 2021-03-28 NOTE — Therapy (Signed)
[place] MAIN [facility] SERVICES [address] [place], [place], [address] Phone: [phone]   Fax:  [phone]  Physical Therapy Treatment  Patient Details  Name: Curtis Powell MRN: [id number] Date of Birth: Feb 23, 1966 Referring Provider (PT): Kym Groom MD   Encounter Date: 03/28/2021   PT End of Session - 03/28/21 1319     Visit Number 50    Number of Visits 32    Date for PT Re-Evaluation 05/14/21    Authorization Type UHC Medicare; [place] Medicaid    Authorization Time Period 9/10 10/31    PT Start Time 1237    PT Stop Time 1340    PT Time Calculation (min) 63 min    Equipment Utilized During Treatment Gait belt    Activity Tolerance Patient limited by fatigue;Patient tolerated treatment well    Behavior During Therapy WFL for tasks assessed/performed             Past Medical History:  Diagnosis Date   GERD (gastroesophageal reflux disease)    Hyperlipidemia    Hypertension    Obesity    Stroke [facility])     Past Surgical History:  Procedure Laterality Date   BRAIN SURGERY      There were no vitals filed for this visit.   Subjective Assessment - 03/28/21 1315     Subjective Patient presents early to PT session. Upon arriving in waiting room patient immediately has episode of urinary incontinance.    Pertinent History Patient is a pleasant 55 year old male who presents to physical therapy for weakness and immobility secondary to CVA.  Had a stroke in Feb 2018. He was previously fully independent, but this stroke caused severe residual deficits, mainly on the right side. He reports difficulty walking and decreased mobility. His mom had to move in with him and now is his main caregiver.  Patient had outpatient PT from November 2018-March 2022. Patient reports since being discharged from outpatient therapy he has kept up with his HEP daily. He does report desire for walking better and for getting in and out of bed easier. He reports his  mom is helping with some ADLs. He reports needing help with rolling in bed. He reports his mom/aide assist with dressing. He has a home aide 3 days out of the week;    Limitations Standing;Walking    How long can you sit comfortably? NA    How long can you stand comfortably? <30 min, does minimal standing;    How long can you walk comfortably? depends on the day, fatigue affects his walking; He is walking every day, about 15-20 min;    Diagnostic tests None recent    Patient Stated Goals to walk better and be independent in bed mobility;    Currently in Pain? No/denies                    Patient had an episode of urinary incontinence resulting in large puddle on floor prior to PT session in waiting room. Patient escorted to restroom requiring mod A to transfer to toilet and max A for cleaning self, changing into scrub bottoms, donning/doffing AFO and shoes, and adult pamper. Wheelchair cleaned and patient transferred back to chair. Mother called and notified of incidence and need for an aide to accompany patient due to increasing frequency of urination and urination incontinence during sessions. Additional toileting required at end of session with mod A for donning/doffing pants, transferring to chair, and washing  of hands set up.    Seated tasks only due to patient wearing scrub bottoms     Seated with # 4lb ankle weights  -Seated marches with upright posture, back away from back of chair for abdominal/trunk activation/stabilization, 15x each LE; 2 sets -Seated LAQ with 3 second holds, 15x each LE, cueing for muscle activation and sequencing for neutral alignment; 2 sets -Seated IR/ER with cueing for stabilizing knee placement with lateral foot movement for optimal muscle recruitment, 15x each Lex2 sets -heel raises 20x; 2 sets  Seated upright posture no back support  -static sit 60 seconds -alternating UE raises 10x each UE  -shadow box 30 seconds x 2 rounds   Trunk  extensions 15x with arms crossed over chests Alternating toe tap reaches 12x each side (penguins) Hamstring isometric pressing into dynadisc 15x 3 second holds        Patient presents early to PT session. Upon arriving in waiting room patient immediately has episode of urinary incontinance. Patient requires mod/max A for cleaning of self, donning of scrub bottoms and clean pamper, etc. Mother called and notified of incidence and need for an aide to accompany patient due to increasing frequency of urination and urination incontinence during sessions. Mother agreeable to use of aide during session. Patient only able to perform seated interventions due to wardrobe. Patient will continue to benefit from skilled PT to improve strength, posture, gait mechanics, and stability to increase functional mobility and independence with ADLs.           PT Education - 03/28/21 1318     Education Details exercise technique, urinary incontinance, need for aide to accompany patient    Person(s) Educated Patient    Methods Explanation;Demonstration;Tactile cues;Verbal cues    Comprehension Verbalized understanding;Returned demonstration;Verbal cues required;Tactile cues required              PT Short Term Goals - 03/19/21 1458       PT SHORT TERM GOAL #1   Title Patient will be adherent to HEP at least 3x a week to improve functional strength and balance for better safety at home.    Baseline 11/15/2020- Patient reports performing seated LE hip march, knee ext, sit to stand and walking daily and states no questions. Advised patient to practice static standing using his walker or kitchen counter several times per day without UE support.    Time 4    Period Weeks    Status Achieved    Target Date 11/01/20      PT SHORT TERM GOAL #2   Title Patient will be independent in bed mobility including sit to supine utilizing bed rails as needed for increased mobility at home.    Baseline 6/29: requires  min A 8/24: patient reports needing mod A 12/7: patient reports needing min<>mod A    Time 4    Period Weeks    Status On-going    Target Date 04/16/21               PT Long Term Goals - 03/19/21 1452       PT LONG TERM GOAL #1   Title Patient will improve FOTO score to > 50% to indicate improve functional mobility at home and in community.    Baseline 6/29: 42% 8/24: 40% 10/17: 40% 12/12: 46%    Time 8    Period Weeks    Status On-going    Target Date 05/14/21      PT LONG TERM GOAL #2  Title Patient (< 63 years old) will complete five times sit to stand test in < 20 seconds indicating an increased LE strength and improved balance.    Baseline 6/29: 23.8 sec with 2HHA. 11/15/2020= 48 sec with BUE Support and CGA using gait belt. 8/24: 23 seconds with 2 HHA 10/17: 19.23 seconds with BUE support 12/7: 20.21s BUE    Time 8    Period Weeks    Status On-going    Target Date 05/14/21      PT LONG TERM GOAL #3   Title Patient will increase 10 meter walk test to >0.15 m/s (60 sec or less) as to improve gait speed for better home ambulation and to reduce fall risk.    Baseline 6/29: 0.08 m/s with RW. 11/15/2020= 0.093 m/s with RW 8/24: 0.15 m/s 12/7= 0.21ms with RW    Time 8    Period Weeks    Status Achieved      PT LONG TERM GOAL #4   Title Patient will increase BLE gross strength to 4+/5 as to improve functional strength for independent gait, increased standing tolerance and increased ADL ability.    Baseline 6/29: see flowsheet. 8/24: hips grossly 4-/5 with hip extension 2+/5; L knee flexion 2+/5 knee extension 4/5; R knee 3+/5 10/17: see note 12/7: see note    Time 8    Period Weeks    Status Partially Met    Target Date 05/14/21      PT LONG TERM GOAL #5   Title Patient will ambulate 250 ft with least assistive device and Supervision with no breaks to allow for increased mobility within home.    Baseline 11/15/2020-Ambulation distance = approx 110 feet using pediatric  bariatric walker 8/24: 155 ft with bariatric RW 10/17: 170 ft with bariatric RW 12/7: 1739fwith bariatric RW, see note    Time 8    Period Weeks    Status Partially Met    Target Date 05/14/21      PT LONG TERM GOAL #6   Title Patient will increase 10 meter walk test to >0.5 m/s ( 30 seconds) as to improve gait speed for better home ambulation and to reduce fall risk.    Baseline 8/24: 61 seconds: 0.15 m/s 10/17: 64 seconds ~0.15 m/s 12/7: 0.1922m(51.5s)    Time 8    Period Weeks    Status Partially Met    Target Date 05/14/21      PT LONG TERM GOAL #7   Title Patient (< 60 80ars old) will complete five times sit to stand test in < 20 seconds with SUE support  indicating an increased LE strength and improved balance.    Baseline 10/17: 19.23 seconds with BUE support 12/7: 20.21s with SUE on RW, SUE on chair    Time 8    Period Weeks    Status On-going    Target Date 05/14/21                   Plan - 03/28/21 1320     Clinical Impression Statement Patient presents early to PT session. Upon arriving in waiting room patient immediately has episode of urinary incontinance. Patient requires mod/max A for cleaning of self, donning of scrub bottoms and clean pamper, etc. Mother called and notified of incidence and need for an aide to accompany patient due to increasing frequency of urination and urination incontinence during sessions. Mother agreeable to use of aide during session. Patient only able to perform  seated interventions due to wardrobe. Patient will continue to benefit from skilled PT to improve strength, posture, gait mechanics, and stability to increase functional mobility and independence with ADLs.    Personal Factors and Comorbidities Comorbidity 3+;Education;Past/Current Experience;Time since onset of injury/illness/exacerbation;Transportation    Comorbidities HTN, arthritis, past CVA, high fall risk, obesity    Examination-Activity Limitations Bed  Mobility;Locomotion Level;Squat;Stairs;Stand;Toileting;Transfers    Examination-Participation Restrictions Community Activity;Driving;Cleaning;Meal Prep;Shop;Volunteer;Yard Work    Stability/Clinical Decision Making Stable/Uncomplicated    Rehab Potential Good    PT Frequency 2x / week    PT Duration 8 weeks    PT Treatment/Interventions ADLs/Self Care Home Management;Cryotherapy;Moist Heat;Gait training;Stair training;Functional mobility training;Therapeutic activities;Therapeutic exercise;Balance training;Neuromuscular re-education;Patient/family education;Orthotic Fit/Training;Passive range of motion;Energy conservation    PT Next Visit Plan Continue seated dynadisc balance. Trial lumbar roll in WC    PT Home Exercise Plan no updates  this session; Reminder to patient to continue with daily walking    Consulted and Agree with Plan of Care Patient             Patient will benefit from skilled therapeutic intervention in order to improve the following deficits and impairments:  Abnormal gait, Decreased balance, Decreased endurance, Decreased mobility, Difficulty walking, Hypomobility, Obesity, Improper body mechanics, Decreased activity tolerance, Decreased strength, Postural dysfunction  Visit Diagnosis: Abnormality of gait and mobility  Muscle weakness (generalized)  Unsteadiness on feet     Problem List Patient Active Problem List   Diagnosis Date Noted   Impaired glucose tolerance 03/23/2020   Learning disability 03/23/2020   Urinary and fecal incontinence 06/14/2019   Chronic pain syndrome 03/23/2019   Constipation 03/23/2019   Dysarthria 03/23/2019   Dysphagia 03/23/2019   Learning difficulty 03/23/2019   Morbid obesity ([place]) 03/23/2019   Muscle weakness 03/23/2019   Vitamin D deficiency 03/23/2019   Bilateral carotid artery stenosis 12/15/2018   Moderate aortic valve stenosis 12/15/2018   BMI 45.0-49.9, adult ([place]) 11/10/2018   Cerebrovascular accident ([place])  05/02/2016   Hemiparesis affecting right side as late effect of cerebrovascular accident ([place]) 05/01/2016   Brain tumor ([place]) 11/24/2014   Gastro-esophageal reflux disease without esophagitis 11/24/2014   Accident due to mechanical fall without injury 08/03/2014   Essential hypertension 06/13/2014   Mixed hyperlipidemia 06/13/2014   Unspecified sequelae of cerebral infarction 06/13/2014   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/2014   Type 2 diabetes mellitus without complication ([place]) 18/48/5927   Neck mass 09/08/2013   Swimmer's ear 09/08/2013   Erectile dysfunction 01/03/2013   Prediabetes 01/03/2013    Janna Arch, PT, DPT  03/28/2021, 1:50 PM  [place] [facility] MAIN [facility] SERVICES [address] [place], [place], [address] Phone: [phone]   Fax:  [phone]  Name: TYDEN KANN MRN: [id number] Date of Birth: [birth year]

## 2021-04-04 ENCOUNTER — Ambulatory Visit: Payer: Medicare Other

## 2021-04-04 ENCOUNTER — Other Ambulatory Visit: Payer: Self-pay

## 2021-04-04 DIAGNOSIS — R269 Unspecified abnormalities of gait and mobility: Secondary | ICD-10-CM

## 2021-04-04 DIAGNOSIS — R278 Other lack of coordination: Secondary | ICD-10-CM

## 2021-04-04 DIAGNOSIS — M6281 Muscle weakness (generalized): Secondary | ICD-10-CM

## 2021-04-04 DIAGNOSIS — R262 Difficulty in walking, not elsewhere classified: Secondary | ICD-10-CM

## 2021-04-04 DIAGNOSIS — R2681 Unsteadiness on feet: Secondary | ICD-10-CM

## 2021-04-04 DIAGNOSIS — R296 Repeated falls: Secondary | ICD-10-CM

## 2021-04-04 NOTE — Therapy (Signed)
[place] MAIN [facility] SERVICES [address] [place], [place], [address] Phone: [phone]   Fax:  [phone]  Physical Therapy Treatment  Patient Details  Name: Curtis Powell MRN: [id number] Date of Birth: [birth year] Referring Provider (PT): Kym Groom MD   Encounter Date: 04/04/2021   PT End of Session - 04/04/21 1519     Visit Number 45    Number of Visits 40    Date for PT Re-Evaluation 05/14/21    Authorization Type U[employer] Medicare; [place] Medicaid    Authorization Time Period 9/10 10/31    PT Start Time 1340    PT Stop Time 1429    PT Time Calculation (min) 49 min    Equipment Utilized During Treatment Gait belt    Activity Tolerance Patient limited by fatigue;Patient tolerated treatment well    Behavior During Therapy WFL for tasks assessed/performed             Past Medical History:  Diagnosis Date   GERD (gastroesophageal reflux disease)    Hyperlipidemia    Hypertension    Obesity    Stroke [facility])     Past Surgical History:  Procedure Laterality Date   BRAIN SURGERY      There were no vitals filed for this visit.   Subjective Assessment - 04/04/21 1516     Subjective Patient reports doing okay-  Per Baker Janus (front office staff)- His mother called earlier and his AFO is broken and she still would like for him to do his strengthening exercises today. As far as the aide, once the [employer] up that aid has to clock out because Medicaid will NOT  pay for both services.    Pertinent History Patient is a pleasant 55 year old male who presents to physical therapy for weakness and immobility secondary to CVA.  Had a stroke in Feb 2018. He was previously fully independent, but this stroke caused severe residual deficits, mainly on the right side. He reports difficulty walking and decreased mobility. His mom had to move in with him and now is his main caregiver.  Patient had outpatient PT from November 2018-March 2022. Patient  reports since being discharged from outpatient therapy he has kept up with his HEP daily. He does report desire for walking better and for getting in and out of bed easier. He reports his mom is helping with some ADLs. He reports needing help with rolling in bed. He reports his mom/aide assist with dressing. He has a home aide 3 days out of the week;    Limitations Standing;Walking    How long can you sit comfortably? NA    How long can you stand comfortably? <30 min, does minimal standing;    How long can you walk comfortably? depends on the day, fatigue affects his walking; He is walking every day, about 15-20 min;    Diagnostic tests None recent    Patient Stated Goals to walk better and be independent in bed mobility;    Currently in Pain? No/denies             *Patient presents without AFO secondary to reported broken- Waiting on orders from MD to be signed for new AFO.   INTERVENTIONS:   Therapeutic Exercises:   Seated with # 4lb ankle weights  -Seated marches - focusing on increasing height of march with slow eccentric control with upright posture, back away from back of chair for abdominal/trunk activation/stabilization, 15x each LE; 2 sets -Seated  LAQ with 3 second holds, 15x each LE, cueing for muscle activation and sequencing for neutral alignment; 2 sets -Seated hip flex/abd/add (stepping up and over PVC pipe) and back with 2 sets of 12 reps -heel raises 20x; 2 sets (VC to slow down and lift heels up as high as possible)    Seated upright posture no back support  -static sit 60 seconds= No reported or observed difficulty - Chest press/row with PVC pipe x 15 reps (VC to maintain erect posture)  -alternating UE raises holding PVC pipe x 15 reps (VC for posture) -shadow box 30 seconds x 2 rounds    Trunk extensions 15x with arms crossed over chests -using green yoga resistance band - VC for technique  Alternating toe tap reaches 12x each side (penguins)  Therapeutic  Activity (due to patient requiring need to have a bowel movement )- Assisted patient with toileting- Including CGA to stand and reach for Support today. Patient then also required assist to lower his pants/pull-up and maneuver back from toilet to w/c.                              PT Education - 04/04/21 1519     Education Details Exercise technique    Person(s) Educated Patient    Methods Explanation;Demonstration;Verbal cues;Tactile cues    Comprehension Verbalized understanding;Returned demonstration;Verbal cues required;Tactile cues required;Need further instruction              PT Short Term Goals - 03/19/21 1458       PT SHORT TERM GOAL #1   Title Patient will be adherent to HEP at least 3x a week to improve functional strength and balance for better safety at home.    Baseline 11/15/2020- Patient reports performing seated LE hip march, knee ext, sit to stand and walking daily and states no questions. Advised patient to practice static standing using his walker or kitchen counter several times per day without UE support.    Time 4    Period Weeks    Status Achieved    Target Date 11/01/20      PT SHORT TERM GOAL #2   Title Patient will be independent in bed mobility including sit to supine utilizing bed rails as needed for increased mobility at home.    Baseline 6/29: requires min A 8/24: patient reports needing mod A 12/7: patient reports needing min<>mod A    Time 4    Period Weeks    Status On-going    Target Date 04/16/21               PT Long Term Goals - 03/19/21 1452       PT LONG TERM GOAL #1   Title Patient will improve FOTO score to > 50% to indicate improve functional mobility at home and in community.    Baseline 6/29: 42% 8/24: 40% 10/17: 40% 12/12: 46%    Time 8    Period Weeks    Status On-going    Target Date 05/14/21      PT LONG TERM GOAL #2   Title Patient (< 10 years old) will complete five times sit to stand test in  < 20 seconds indicating an increased LE strength and improved balance.    Baseline 6/29: 23.8 sec with 2HHA. 11/15/2020= 48 sec with BUE Support and CGA using gait belt. 8/24: 23 seconds with 2 HHA 10/17: 19.23 seconds with BUE support 12/7:  20.21s BUE    Time 8    Period Weeks    Status On-going    Target Date 05/14/21      PT LONG TERM GOAL #3   Title Patient will increase 10 meter walk test to >0.15 m/s (60 sec or less) as to improve gait speed for better home ambulation and to reduce fall risk.    Baseline 6/29: 0.08 m/s with RW. 11/15/2020= 0.093 m/s with RW 8/24: 0.15 m/s 12/7= 0.33ms with RW    Time 8    Period Weeks    Status Achieved      PT LONG TERM GOAL #4   Title Patient will increase BLE gross strength to 4+/5 as to improve functional strength for independent gait, increased standing tolerance and increased ADL ability.    Baseline 6/29: see flowsheet. 8/24: hips grossly 4-/5 with hip extension 2+/5; L knee flexion 2+/5 knee extension 4/5; R knee 3+/5 10/17: see note 12/7: see note    Time 8    Period Weeks    Status Partially Met    Target Date 05/14/21      PT LONG TERM GOAL #5   Title Patient will ambulate 250 ft with least assistive device and Supervision with no breaks to allow for increased mobility within home.    Baseline 11/15/2020-Ambulation distance = approx 110 feet using pediatric bariatric walker 8/24: 155 ft with bariatric RW 10/17: 170 ft with bariatric RW 12/7: 1747fwith bariatric RW, see note    Time 8    Period Weeks    Status Partially Met    Target Date 05/14/21      PT LONG TERM GOAL #6   Title Patient will increase 10 meter walk test to >0.5 m/s ( 30 seconds) as to improve gait speed for better home ambulation and to reduce fall risk.    Baseline 8/24: 61 seconds: 0.15 m/s 10/17: 64 seconds ~0.15 m/s 12/7: 0.1915m(51.5s)    Time 8    Period Weeks    Status Partially Met    Target Date 05/14/21      PT LONG TERM GOAL #7   Title Patient (< 60 53ears old) will complete five times sit to stand test in < 20 seconds with SUE support  indicating an increased LE strength and improved balance.    Baseline 10/17: 19.23 seconds with BUE support 12/7: 20.21s with SUE on RW, SUE on chair    Time 8    Period Weeks    Status On-going    Target Date 05/14/21                   Plan - 04/04/21 1516     Clinical Impression Statement Patient able to tolerate all therex well today without report of pain and only presents with fatigue with all Upper and lower body exercises. Patient was able to demo improved eccentric control overall with VC and able to perform some overhead UE activity with pvc pipe today. Treatment was limited to strengthening as patient reports his AFO is broken. Patient will continue to benefit from skilled PT to improve strength, posture, gait mechanics, and stability to increase functional mobility and independence with ADLs. Will have Primary PT follow up on discussing continuing PT services due to mothers response to aide not able to come to PT.    Personal Factors and Comorbidities Comorbidity 3+;Education;Past/Current Experience;Time since onset of injury/illness/exacerbation;Transportation    Comorbidities HTN, arthritis, past CVA, high fall risk,  obesity    Examination-Activity Limitations Bed Mobility;Locomotion Level;Squat;Stairs;Stand;Toileting;Transfers    Examination-Participation Restrictions Community Activity;Driving;Cleaning;Meal Prep;Shop;Volunteer;Yard Work    Stability/Clinical Decision Making Stable/Uncomplicated    Rehab Potential Good    PT Frequency 2x / week    PT Duration 8 weeks    PT Treatment/Interventions ADLs/Self Care Home Management;Cryotherapy;Moist Heat;Gait training;Stair training;Functional mobility training;Therapeutic activities;Therapeutic exercise;Balance training;Neuromuscular re-education;Patient/family education;Orthotic Fit/Training;Passive range of motion;Energy conservation     PT Next Visit Plan Continue seated dynadisc balance. Trial lumbar roll in WC    PT Home Exercise Plan no updates  this session; Reminder to patient to continue with daily walking    Consulted and Agree with Plan of Care Patient             Patient will benefit from skilled therapeutic intervention in order to improve the following deficits and impairments:  Abnormal gait, Decreased balance, Decreased endurance, Decreased mobility, Difficulty walking, Hypomobility, Obesity, Improper body mechanics, Decreased activity tolerance, Decreased strength, Postural dysfunction  Visit Diagnosis: Abnormality of gait and mobility  Difficulty in walking, not elsewhere classified  Muscle weakness (generalized)  Other lack of coordination  Unsteadiness on feet  Repeated falls     Problem List Patient Active Problem List   Diagnosis Date Noted   Impaired glucose tolerance 03/23/2020   Learning disability 03/23/2020   Urinary and fecal incontinence 06/14/2019   Chronic pain syndrome 03/23/2019   Constipation 03/23/2019   Dysarthria 03/23/2019   Dysphagia 03/23/2019   Learning difficulty 03/23/2019   Morbid obesity ([place]) 03/23/2019   Muscle weakness 03/23/2019   Vitamin D deficiency 03/23/2019   Bilateral carotid artery stenosis 12/15/2018   Moderate aortic valve stenosis 12/15/2018   BMI 45.0-49.9, adult ([place]) 11/10/2018   Cerebrovascular accident ([place]) 05/02/2016   Hemiparesis affecting right side as late effect of cerebrovascular accident ([place]) 05/01/2016   Brain tumor ([place]) 11/24/2014   Gastro-esophageal reflux disease without esophagitis 11/24/2014   Accident due to mechanical fall without injury 08/03/2014   Essential hypertension 06/13/2014   Mixed hyperlipidemia 06/13/2014   Unspecified sequelae of cerebral infarction 06/13/2014   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

## 2021-04-11 ENCOUNTER — Ambulatory Visit: Payer: Medicare Other | Attending: Family Medicine

## 2021-04-11 ENCOUNTER — Other Ambulatory Visit: Payer: Self-pay

## 2021-04-11 DIAGNOSIS — R269 Unspecified abnormalities of gait and mobility: Secondary | ICD-10-CM | POA: Diagnosis present

## 2021-04-11 DIAGNOSIS — R278 Other lack of coordination: Secondary | ICD-10-CM | POA: Insufficient documentation

## 2021-04-11 DIAGNOSIS — R2689 Other abnormalities of gait and mobility: Secondary | ICD-10-CM | POA: Diagnosis present

## 2021-04-11 DIAGNOSIS — R2681 Unsteadiness on feet: Secondary | ICD-10-CM | POA: Insufficient documentation

## 2021-04-11 DIAGNOSIS — R262 Difficulty in walking, not elsewhere classified: Secondary | ICD-10-CM | POA: Insufficient documentation

## 2021-04-11 DIAGNOSIS — M6281 Muscle weakness (generalized): Secondary | ICD-10-CM | POA: Insufficient documentation

## 2021-04-11 DIAGNOSIS — R296 Repeated falls: Secondary | ICD-10-CM | POA: Insufficient documentation

## 2021-04-11 NOTE — Therapy (Signed)
[place] °[facility] °[address] °[place], [place], [address] °Phone: [phone]   Fax:  [phone] ° °Physical Therapy Treatment ° °Patient Details  °Name: Curtis Powell °MRN: [id number] °Date of Birth: 11/19/1965 °Referring Provider (PT): Olmedo MD ° ° °Encounter Date: 04/11/2021 ° ° PT End of Session - 04/11/21 1317   ° ° Visit Number [id number]   ° Number of Visits 58   ° Date for PT Re-Evaluation 05/14/21   ° Authorization Type [employer] Medicare; [place] Medicaid   ° PT Start Time 1300   ° PT Stop Time 1345   ° PT Time Calculation (min) 45 min   ° Equipment Utilized During Treatment Gait belt   ° Activity Tolerance Patient limited by fatigue;Patient tolerated treatment well   ° Behavior During Therapy WFL for tasks assessed/performed   ° °  °  ° °  ° ° °Past Medical History:  °Diagnosis Date  ° GERD (gastroesophageal reflux disease)   ° Hyperlipidemia   ° Hypertension   ° Obesity   ° Stroke (HCC)   ° ° °Past Surgical History:  °Procedure Laterality Date  ° BRAIN SURGERY    ° ° °There were no vitals filed for this visit. ° ° Subjective Assessment - 04/11/21 1315   ° ° Subjective Patient understands he must have aide with him for all future sessions. Mother aware and will start next week with aide during sessions. Is having R knee pain.   ° Pertinent History Patient is a pleasant 56 year old male who presents to physical therapy for weakness and immobility secondary to CVA.  Had a stroke in Feb 2018. He was previously fully independent, but this stroke caused severe residual deficits, mainly on the right side. He reports difficulty walking and decreased mobility. His mom had to move in with him and now is his main caregiver.  Patient had outpatient PT from November 2018-March 2022. Patient reports since being discharged from outpatient therapy he has kept up with his HEP daily. He does report desire for walking better and for getting in and out of bed easier. He reports his mom is  helping with some ADLs. He reports needing help with rolling in bed. He reports his mom/aide assist with dressing. He has a home aide 3 days out of the week;   ° Limitations Standing;Walking   ° How long can you sit comfortably? NA   ° How long can you stand comfortably? <30 min, does minimal standing;   ° How long can you walk comfortably? depends on the day, fatigue affects his walking; He is walking every day, about 15-20 min;   ° Diagnostic tests None recent   ° Patient Stated Goals to walk better and be independent in bed mobility;   ° Currently in Pain? Yes   ° Pain Score 5    ° Pain Location Knee   ° Pain Orientation Right;Left   ° Pain Descriptors / Indicators Aching   ° Pain Type Acute pain   ° Pain Onset In the past 7 days   ° Pain Frequency Intermittent   ° °  °  ° °  ° ° °Multiple phone calls with mother placed yesterday and today. Patient will be able to continue therapy at outpatient if and only if accompanied by an aide.  ° ° ° ° ° °*Patient presents without AFO secondary to reported broken- Waiting on orders from MD to be signed for new AFO.  °  °  INTERVENTIONS:  °  °Therapeutic Exercises:  ° °Standing: °Static stand 2 minutes °Stand with march: posterior LOB first attempt; second attempt able to perform 10x each LE ° °Seated with # 4lb ankle weights  °-Seated marches - focusing on increasing height of march with slow eccentric control with upright posture, back away from back of chair for abdominal/trunk activation/stabilization, 15x each LE; 2 sets °-Seated LAQ with 3 second holds, 15x each LE, cueing for muscle activation and sequencing for neutral alignment; 2 sets °-Seated hip flex/abd/add (stepping up and over theraband and back with 2 sets of 12 reps °-heel raises 20x; 2 sets (VC to slow down and lift heels up as high as possible)  °  °Seated upright posture no back support  °-static sit 60 seconds= No reported or observed difficulty °- Chest press/row with PVC pipe x 15 reps (VC to maintain  erect posture)  °-alternating UE raises holding PVC pipe x 15 reps (VC for posture) °-shadow box 30 seconds x 2 rounds  °  °Trunk extensions 15x with arms crossed over chests  °Alternating toe tap reaches 12x each side (penguins) °  ° ° ° °TherAc: °  °  °Toileting (void in standing) performed 2x at end of session and beginning with patient requiring assistance with donning doffing pants, stabilizing self, and washing of hands. Pt held onto grab bars for safety and stabilization. ° ° ° °Patient reports R knee pain initially but improved throughout session. Still waiting physician orders for new AFO. Multiple phone calls with mother placed yesterday and today. Patient will be able to continue therapy at outpatient if and only if accompanied by an aide due to incontinence and limited session time due to frequency of rest room use; otherwise will need to return to home health therapy. Patient will continue to benefit from skilled PT to improve strength, posture, gait mechanics, and stability to increase functional mobility and independence with ADLs. ° ° ° ° ° ° ° ° ° ° ° ° ° PT Education - 04/11/21 1300   ° ° Education Details exercise technique, body mechanics   ° Person(s) Educated Patient   ° Methods Explanation;Demonstration;Tactile cues;Verbal cues   ° Comprehension Verbalized understanding;Returned demonstration;Verbal cues required;Tactile cues required   ° °  °  ° °  ° ° ° PT Short Term Goals - 03/19/21 1458   ° °  ° PT SHORT TERM GOAL #1  ° Title Patient will be adherent to HEP at least 3x a week to improve functional strength and balance for better safety at home.   ° Baseline 11/15/2020- Patient reports performing seated LE hip march, knee ext, sit to stand and walking daily and states no questions. Advised patient to practice static standing using his walker or kitchen counter several times per day without UE support.   ° Time 4   ° Period Weeks   ° Status Achieved   ° Target Date 11/01/20   °  ° PT SHORT  TERM GOAL #2  ° Title Patient will be independent in bed mobility including sit to supine utilizing bed rails as needed for increased mobility at home.   ° Baseline 6/29: requires min A 8/24: patient reports needing mod A 12/7: patient reports needing min<>mod A   ° Time 4   ° Period Weeks   ° Status On-going   ° Target Date 04/16/21   ° °  °  ° °  ° ° ° ° PT Long Term Goals - 03/19/21 1452   ° °  °   PT LONG TERM GOAL #1   Title Patient will improve FOTO score to > 50% to indicate improve functional mobility at home and in community.    Baseline 6/29: 42% 8/24: 40% 10/17: 40% 12/12: 46%    Time 8    Period Weeks    Status On-going    Target Date 05/14/21      PT LONG TERM GOAL #2   Title Patient (< 54 years old) will complete five times sit to stand test in < 20 seconds indicating an increased LE strength and improved balance.    Baseline 6/29: 23.8 sec with 2HHA. 11/15/2020= 48 sec with BUE Support and CGA using gait belt. 8/24: 23 seconds with 2 HHA 10/17: 19.23 seconds with BUE support 12/7: 20.21s BUE    Time 8    Period Weeks    Status On-going    Target Date 05/14/21      PT LONG TERM GOAL #3   Title Patient will increase 10 meter walk test to >0.15 m/s (60 sec or less) as to improve gait speed for better home ambulation and to reduce fall risk.    Baseline 6/29: 0.08 m/s with RW. 11/15/2020= 0.093 m/s with RW 8/24: 0.15 m/s 12/7= 0.82ms with RW    Time 8    Period Weeks    Status Achieved      PT LONG TERM GOAL #4   Title Patient will increase BLE gross strength to 4+/5 as to improve functional strength for independent gait, increased standing tolerance and increased ADL ability.    Baseline 6/29: see flowsheet. 8/24: hips grossly 4-/5 with hip extension 2+/5; L knee flexion 2+/5 knee extension 4/5; R knee 3+/5 10/17: see note 12/7: see note    Time 8    Period Weeks    Status Partially Met    Target Date 05/14/21      PT LONG TERM GOAL #5   Title Patient will ambulate 250 ft  with least assistive device and Supervision with no breaks to allow for increased mobility within home.    Baseline 11/15/2020-Ambulation distance = approx 110 feet using pediatric bariatric walker 8/24: 155 ft with bariatric RW 10/17: 170 ft with bariatric RW 12/7: 1761fwith bariatric RW, see note    Time 8    Period Weeks    Status Partially Met    Target Date 05/14/21      PT LONG TERM GOAL #6   Title Patient will increase 10 meter walk test to >0.5 m/s ( 30 seconds) as to improve gait speed for better home ambulation and to reduce fall risk.    Baseline 8/24: 61 seconds: 0.15 m/s 10/17: 64 seconds ~0.15 m/s 12/7: 0.1975m(51.5s)    Time 8    Period Weeks    Status Partially Met    Target Date 05/14/21      PT LONG TERM GOAL #7   Title Patient (< 60 59ars old) will complete five times sit to stand test in < 20 seconds with SUE support  indicating an increased LE strength and improved balance.    Baseline 10/17: 19.23 seconds with BUE support 12/7: 20.21s with SUE on RW, SUE on chair    Time 8    Period Weeks    Status On-going    Target Date 05/14/21                   Plan - 04/11/21 1318     Clinical Impression Statement Patient  reports R knee pain initially but improved throughout session. Still waiting physician orders for new AFO. Multiple phone calls with mother placed yesterday and today. Patient will be able to continue therapy at outpatient if and only if accompanied by an aide due to incontinence and limited session time due to frequency of rest room use; otherwise will need to return to home health therapy. Patient will continue to benefit from skilled PT to improve strength, posture, gait mechanics, and stability to increase functional mobility and independence with ADLs.    Personal Factors and Comorbidities Comorbidity 3+;Education;Past/Current Experience;Time since onset of injury/illness/exacerbation;Transportation    Comorbidities HTN, arthritis, past CVA,  high fall risk, obesity    Examination-Activity Limitations Bed Mobility;Locomotion Level;Squat;Stairs;Stand;Toileting;Transfers    Examination-Participation Restrictions Community Activity;Driving;Cleaning;Meal Prep;Shop;Volunteer;Yard Work    Stability/Clinical Decision Making Stable/Uncomplicated    Rehab Potential Good    PT Frequency 2x / week    PT Duration 8 weeks    PT Treatment/Interventions ADLs/Self Care Home Management;Cryotherapy;Moist Heat;Gait training;Stair training;Functional mobility training;Therapeutic activities;Therapeutic exercise;Balance training;Neuromuscular re-education;Patient/family education;Orthotic Fit/Training;Passive range of motion;Energy conservation    PT Next Visit Plan Continue seated dynadisc balance. Trial lumbar roll in WC    PT Home Exercise Plan no updates  this session; Reminder to patient to continue with daily walking    Consulted and Agree with Plan of Care Patient             Patient will benefit from skilled therapeutic intervention in order to improve the following deficits and impairments:  Abnormal gait, Decreased balance, Decreased endurance, Decreased mobility, Difficulty walking, Hypomobility, Obesity, Improper body mechanics, Decreased activity tolerance, Decreased strength, Postural dysfunction  Visit Diagnosis: Abnormality of gait and mobility  Difficulty in walking, not elsewhere classified  Muscle weakness (generalized)     Problem List Patient Active Problem List   Diagnosis Date Noted   Impaired glucose tolerance 03/23/2020   Learning disability 03/23/2020   Urinary and fecal incontinence 06/14/2019   Chronic pain syndrome 03/23/2019   Constipation 03/23/2019   Dysarthria 03/23/2019   Dysphagia 03/23/2019   Learning difficulty 03/23/2019   Morbid obesity ([place]) 03/23/2019   Muscle weakness 03/23/2019   Vitamin D deficiency 03/23/2019   Bilateral carotid artery stenosis 12/15/2018   Moderate aortic valve  stenosis 12/15/2018   BMI 45.0-49.9, adult ([place]) 11/10/2018   Cerebrovascular accident (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

## 2021-04-16 ENCOUNTER — Ambulatory Visit: Payer: Medicare Other

## 2021-04-18 ENCOUNTER — Ambulatory Visit: Payer: Medicare Other

## 2021-04-18 ENCOUNTER — Ambulatory Visit: Payer: Medicare Other | Admitting: Physical Therapy

## 2021-04-18 ENCOUNTER — Encounter: Payer: Self-pay | Admitting: Physical Therapy

## 2021-04-18 ENCOUNTER — Other Ambulatory Visit: Payer: Self-pay

## 2021-04-18 DIAGNOSIS — R269 Unspecified abnormalities of gait and mobility: Secondary | ICD-10-CM | POA: Diagnosis not present

## 2021-04-18 DIAGNOSIS — M6281 Muscle weakness (generalized): Secondary | ICD-10-CM

## 2021-04-18 DIAGNOSIS — R2689 Other abnormalities of gait and mobility: Secondary | ICD-10-CM

## 2021-04-18 DIAGNOSIS — R262 Difficulty in walking, not elsewhere classified: Secondary | ICD-10-CM

## 2021-04-18 DIAGNOSIS — R278 Other lack of coordination: Secondary | ICD-10-CM

## 2021-04-18 DIAGNOSIS — R2681 Unsteadiness on feet: Secondary | ICD-10-CM

## 2021-04-18 DIAGNOSIS — R296 Repeated falls: Secondary | ICD-10-CM

## 2021-04-18 NOTE — Therapy (Signed)
[place] MAIN [facility] SERVICES [address] [place], [place], [address] Phone: [phone]   Fax:  [phone]  Physical Therapy Treatment  Patient Details  Name: Curtis Powell MRN: [id number] Date of Birth: 1965-09-07 Referring Provider (PT): Kym Groom MD   Encounter Date: 04/18/2021   PT End of Session - 04/18/21 1504     Visit Number 23    Number of Visits 3    Date for PT Re-Evaluation 05/14/21    Authorization Type UHC Medicare; [place] Medicaid    PT Start Time 1301    PT Stop Time 1346    PT Time Calculation (min) 45 min    Equipment Utilized During Treatment Gait belt    Activity Tolerance Patient limited by fatigue;Patient tolerated treatment well    Behavior During Therapy WFL for tasks assessed/performed             Past Medical History:  Diagnosis Date   GERD (gastroesophageal reflux disease)    Hyperlipidemia    Hypertension    Obesity    Stroke [facility])     Past Surgical History:  Procedure Laterality Date   BRAIN SURGERY      There were no vitals filed for this visit.   Subjective Assessment - 04/18/21 1305     Subjective Patient reports doing well; He denies any new falls or stumbles. Denies any pain; He was wanting to know how things are going with getting a new AFO. Will follow up;    Pertinent History Patient is a pleasant 56 year old male who presents to physical therapy for weakness and immobility secondary to CVA.  Had a stroke in Feb 2018. He was previously fully independent, but this stroke caused severe residual deficits, mainly on the right side. He reports difficulty walking and decreased mobility. His mom had to move in with him and now is his main caregiver.  Patient had outpatient PT from November 2018-March 2022. Patient reports since being discharged from outpatient therapy he has kept up with his HEP daily. He does report desire for walking better and for getting in and out of bed easier. He reports his  mom is helping with some ADLs. He reports needing help with rolling in bed. He reports his mom/aide assist with dressing. He has a home aide 3 days out of the week;    Limitations Standing;Walking    How long can you sit comfortably? NA    How long can you stand comfortably? <30 min, does minimal standing;    How long can you walk comfortably? depends on the day, fatigue affects his walking; He is walking every day, about 15-20 min;    Diagnostic tests None recent    Patient Stated Goals to walk better and be independent in bed mobility;    Currently in Pain? No/denies    Pain Onset In the past 7 days                  INTERVENTIONS:    Therapeutic Exercises:    Seated with # 4lb ankle weights  -Seated marches - focusing on increasing height of march with slow eccentric control with upright posture, back away from back of chair for abdominal/trunk activation/stabilization, 15x each LE;  -Seated LAQ with 3 second holds, 15x each LE, cueing for muscle activation and sequencing for neutral alignment; -Seated hip flex/abd/add (stepping up and over stick and back with 1 sets of 12 reps each LE -heel raises 20x; 2 sets (  VC to slow down and lift heels up as high as possible)    Gait training: Patient required min A to transfer sit<>Stand with RW (used bariatric RW) Instructed patient in gait on level surface x44 feet x1, x60 feet x1 Patient exhibits forward flexed posture and increased lateral trunk lean during swing phase due to gluteal weakness. He required min VCs for increased erect posture and to increase hip flexion during swing for better step length. Patient able to exhibit reciprocal gait pattern throughout walking with intermittent normal step length. He does not exhibit any foot drag during session.    At end of session patient did require assistance to restroom. Aide assisted patient to the bathroom.   He tolerated session well. Reports minimal fatigue. Would benefit from  additional skilled intervention to improve strength and mobility to tolerance;                         PT Education - 04/18/21 1504     Education Details exercise technique/gait safety;    Person(s) Educated Patient    Methods Explanation;Verbal cues    Comprehension Verbalized understanding;Returned demonstration;Verbal cues required;Need further instruction              PT Short Term Goals - 03/19/21 1458       PT SHORT TERM GOAL #1   Title Patient will be adherent to HEP at least 3x a week to improve functional strength and balance for better safety at home.    Baseline 11/15/2020- Patient reports performing seated LE hip march, knee ext, sit to stand and walking daily and states no questions. Advised patient to practice static standing using his walker or kitchen counter several times per day without UE support.    Time 4    Period Weeks    Status Achieved    Target Date 11/01/20      PT SHORT TERM GOAL #2   Title Patient will be independent in bed mobility including sit to supine utilizing bed rails as needed for increased mobility at home.    Baseline 6/29: requires min A 8/24: patient reports needing mod A 12/7: patient reports needing min<>mod A    Time 4    Period Weeks    Status On-going    Target Date 04/16/21               PT Long Term Goals - 03/19/21 1452       PT LONG TERM GOAL #1   Title Patient will improve FOTO score to > 50% to indicate improve functional mobility at home and in community.    Baseline 6/29: 42% 8/24: 40% 10/17: 40% 12/12: 46%    Time 8    Period Weeks    Status On-going    Target Date 05/14/21      PT LONG TERM GOAL #2   Title Patient (< 42 years old) will complete five times sit to stand test in < 20 seconds indicating an increased LE strength and improved balance.    Baseline 6/29: 23.8 sec with 2HHA. 11/15/2020= 48 sec with BUE Support and CGA using gait belt. 8/24: 23 seconds with 2 HHA 10/17: 19.23  seconds with BUE support 12/7: 20.21s BUE    Time 8    Period Weeks    Status On-going    Target Date 05/14/21      PT LONG TERM GOAL #3   Title Patient will increase 10 meter walk test  to >0.15 m/s (60 sec or less) as to improve gait speed for better home ambulation and to reduce fall risk.    Baseline 6/29: 0.08 m/s with RW. 11/15/2020= 0.093 m/s with RW 8/24: 0.15 m/s 12/7= 0.48ms with RW    Time 8    Period Weeks    Status Achieved      PT LONG TERM GOAL #4   Title Patient will increase BLE gross strength to 4+/5 as to improve functional strength for independent gait, increased standing tolerance and increased ADL ability.    Baseline 6/29: see flowsheet. 8/24: hips grossly 4-/5 with hip extension 2+/5; L knee flexion 2+/5 knee extension 4/5; R knee 3+/5 10/17: see note 12/7: see note    Time 8    Period Weeks    Status Partially Met    Target Date 05/14/21      PT LONG TERM GOAL #5   Title Patient will ambulate 250 ft with least assistive device and Supervision with no breaks to allow for increased mobility within home.    Baseline 11/15/2020-Ambulation distance = approx 110 feet using pediatric bariatric walker 8/24: 155 ft with bariatric RW 10/17: 170 ft with bariatric RW 12/7: 1778fwith bariatric RW, see note    Time 8    Period Weeks    Status Partially Met    Target Date 05/14/21      PT LONG TERM GOAL #6   Title Patient will increase 10 meter walk test to >0.5 m/s ( 30 seconds) as to improve gait speed for better home ambulation and to reduce fall risk.    Baseline 8/24: 61 seconds: 0.15 m/s 10/17: 64 seconds ~0.15 m/s 12/7: 0.1962m(51.5s)    Time 8    Period Weeks    Status Partially Met    Target Date 05/14/21      PT LONG TERM GOAL #7   Title Patient (< 60 30ars old) will complete five times sit to stand test in < 20 seconds with SUE support  indicating an increased LE strength and improved balance.    Baseline 10/17: 19.23 seconds with BUE support 12/7: 20.21s  with SUE on RW, SUE on chair    Time 8    Period Weeks    Status On-going    Target Date 05/14/21                   Plan - 04/18/21 1505     Clinical Impression Statement Patient motivated and participated well within session. Aide present during therapy session. Patient instructed in advanced LE strengthening exercise. He requires frequent verbal cues and visual cues for proper positioning, increased ROM for better strengthening and to slow down LE movement for better motor control. He presents to therapy without AFO; Patient reports his AFO is broken. He states he has been walking at home without AFO and requested to walk during therapy session. He was instructed in gait training on level surface with RW with cues for increased erect posture, increase hip flexion for better foot clearance and gait mechanics. He exhibits minimal foot drag. He exhibits heavy forward lean and increased lateral lean during limb advancement for compensation for gluteal weakness. He would benefit from additional skilled PT Intervention to improve strength and mobility; Will continue to follow up on AFO order.    Personal Factors and Comorbidities Comorbidity 3+;Education;Past/Current Experience;Time since onset of injury/illness/exacerbation;Transportation    Comorbidities HTN, arthritis, past CVA, high fall risk, obesity    Examination-Activity  Limitations Bed Mobility;Locomotion Level;Squat;Stairs;Stand;Toileting;Transfers    Examination-Participation Restrictions Community Activity;Driving;Cleaning;Meal Prep;Shop;Volunteer;Yard Work    Stability/Clinical Decision Making Stable/Uncomplicated    Rehab Potential Good    PT Frequency 2x / week    PT Duration 8 weeks    PT Treatment/Interventions ADLs/Self Care Home Management;Cryotherapy;Moist Heat;Gait training;Stair training;Functional mobility training;Therapeutic activities;Therapeutic exercise;Balance training;Neuromuscular re-education;Patient/family  education;Orthotic Fit/Training;Passive range of motion;Energy conservation    PT Next Visit Plan Continue seated dynadisc balance. Trial lumbar roll in WC    PT Home Exercise Plan no updates  this session; Reminder to patient to continue with daily walking    Consulted and Agree with Plan of Care Patient             Patient will benefit from skilled therapeutic intervention in order to improve the following deficits and impairments:  Abnormal gait, Decreased balance, Decreased endurance, Decreased mobility, Difficulty walking, Hypomobility, Obesity, Improper body mechanics, Decreased activity tolerance, Decreased strength, Postural dysfunction  Visit Diagnosis: Other abnormalities of gait and mobility  Difficulty in walking, not elsewhere classified  Muscle weakness (generalized)  Other lack of coordination  Unsteadiness on feet  Repeated falls     Problem List Patient Active Problem List   Diagnosis Date Noted   Impaired glucose tolerance 03/23/2020   Learning disability 03/23/2020   Urinary and fecal incontinence 06/14/2019   Chronic pain syndrome 03/23/2019   Constipation 03/23/2019   Dysarthria 03/23/2019   Dysphagia 03/23/2019   Learning difficulty 03/23/2019   Morbid obesity ([place]) 03/23/2019   Muscle weakness 03/23/2019   Vitamin D deficiency 03/23/2019   Bilateral carotid artery stenosis 12/15/2018   Moderate aortic valve stenosis 12/15/2018   BMI 45.0-49.9, adult ([place]) 11/10/2018   Cerebrovascular accident ([place]) 05/02/2016   Hemiparesis affecting right side as late effect of cerebrovascular accident ([place]) 05/01/2016   Brain tumor ([place]) 11/24/2014   Gastro-esophageal reflux disease without esophagitis 11/24/2014   Accident due to mechanical fall without injury 08/03/2014   Essential hypertension 06/13/2014   Mixed hyperlipidemia 06/13/2014   Unspecified sequelae of cerebral infarction 06/13/2014   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 [place], [place], [address] Phone: [phone]   Fax:  [phone]  Name: YOUSEF HUGE MRN: [id number] Date of Birth: 15-Mar-1966

## 2021-04-23 ENCOUNTER — Ambulatory Visit: Payer: Medicare Other

## 2021-04-25 ENCOUNTER — Other Ambulatory Visit: Payer: Self-pay

## 2021-04-25 ENCOUNTER — Ambulatory Visit: Payer: Medicare Other

## 2021-04-25 DIAGNOSIS — M6281 Muscle weakness (generalized): Secondary | ICD-10-CM

## 2021-04-25 DIAGNOSIS — R269 Unspecified abnormalities of gait and mobility: Secondary | ICD-10-CM

## 2021-04-25 DIAGNOSIS — R2681 Unsteadiness on feet: Secondary | ICD-10-CM

## 2021-04-25 DIAGNOSIS — R262 Difficulty in walking, not elsewhere classified: Secondary | ICD-10-CM

## 2021-04-25 NOTE — Therapy (Signed)
[place] MAIN [facility] SERVICES [address] [place], [place], [address] Phone: [phone]   Fax:  [phone]  Physical Therapy Treatment  Patient Details  Name: Curtis Powell MRN: [id number] Date of Birth: 12/15/1965 Referring Provider (PT): Kym Groom MD   Encounter Date: 04/25/2021   PT End of Session - 04/25/21 1652     Visit Number 48    Number of Visits 58    Date for PT Re-Evaluation 05/14/21    Authorization Type UHC Medicare; [place] Medicaid    PT Start Time 1304    PT Stop Time 1344    PT Time Calculation (min) 40 min    Equipment Utilized During Treatment Gait belt    Activity Tolerance Patient limited by fatigue;Patient tolerated treatment well    Behavior During Therapy WFL for tasks assessed/performed             Past Medical History:  Diagnosis Date   GERD (gastroesophageal reflux disease)    Hyperlipidemia    Hypertension    Obesity    Stroke [facility])     Past Surgical History:  Procedure Laterality Date   BRAIN SURGERY      There were no vitals filed for this visit.   Subjective Assessment - 04/25/21 1654     Subjective Patient reports doing well overall today and denies any pain or new issues.    Pertinent History Patient is a pleasant 56 year old male who presents to physical therapy for weakness and immobility secondary to CVA.  Had a stroke in Feb 2018. He was previously fully independent, but this stroke caused severe residual deficits, mainly on the right side. He reports difficulty walking and decreased mobility. His mom had to move in with him and now is his main caregiver.  Patient had outpatient PT from November 2018-March 2022. Patient reports since being discharged from outpatient therapy he has kept up with his HEP daily. He does report desire for walking better and for getting in and out of bed easier. He reports his mom is helping with some ADLs. He reports needing help with rolling in bed. He reports his  mom/aide assist with dressing. He has a home aide 3 days out of the week;    Limitations Standing;Walking    How long can you sit comfortably? NA    How long can you stand comfortably? <30 min, does minimal standing;    How long can you walk comfortably? depends on the day, fatigue affects his walking; He is walking every day, about 15-20 min;    Diagnostic tests None recent    Patient Stated Goals to walk better and be independent in bed mobility;    Currently in Pain? No/denies    Pain Onset In the past 7 days              INTERVENTIONS:    Therapeutic Exercises:    Seated LE strengthening with # 4lb ankle weights   -Seated marches - VC for height of march with slow eccentric control with upright posture, back away from back of chair for abdominal/trunk activation/stabilization x 15 reps each LE.  -Seated LAQ  15x each LE, cueing for muscle activation  - seated hamstring curls with YTB 2 sets of 15 reps.   Seated UE/postural  strengthening: VC to keep back/trunk erect without resting on back support - Shoulder Horizontal ABD- YTB x 10 reps -Scapular retraction YTB x 15 reps- VC to slow down for improved eccentric  control   Gait training: Patient required min A to transfer sit<>Stand with RW (used bariatric RW) Instructed patient in gait on level surface with CGA and close w/c follow x 40 feet Patient exhibits forward flexed posture and increased lateral trunk lean during swing phase due to gluteal weakness. He required min VCs for increased erect posture and to increase hip flexion during swing for better step length. Patient able to exhibit reciprocal gait pattern throughout walking with intermittent normal step length. He does not exhibit any foot drag during session.   For 2nd and 3rd trial - instructed patient to walk and count each step and challenged patient on 3rd trial to take less steps than on 2nd trial.  Patient ambulated 40 feet - requiring 71 steps on 2nd  trial Patient ambulated another feet feet- requiring 76 steps on 3rd trial with obvious fatigue.                                 PT Education - 04/25/21 1654     Education Details Exercise technique and gait safety    Person(s) Educated Patient    Methods Explanation;Verbal cues    Comprehension Verbalized understanding;Returned demonstration;Tactile cues required;Need further instruction;Verbal cues required              PT Short Term Goals - 03/19/21 1458       PT SHORT TERM GOAL #1   Title Patient will be adherent to HEP at least 3x a week to improve functional strength and balance for better safety at home.    Baseline 11/15/2020- Patient reports performing seated LE hip march, knee ext, sit to stand and walking daily and states no questions. Advised patient to practice static standing using his walker or kitchen counter several times per day without UE support.    Time 4    Period Weeks    Status Achieved    Target Date 11/01/20      PT SHORT TERM GOAL #2   Title Patient will be independent in bed mobility including sit to supine utilizing bed rails as needed for increased mobility at home.    Baseline 6/29: requires min A 8/24: patient reports needing mod A 12/7: patient reports needing min<>mod A    Time 4    Period Weeks    Status On-going    Target Date 04/16/21               PT Long Term Goals - 03/19/21 1452       PT LONG TERM GOAL #1   Title Patient will improve FOTO score to > 50% to indicate improve functional mobility at home and in community.    Baseline 6/29: 42% 8/24: 40% 10/17: 40% 12/12: 46%    Time 8    Period Weeks    Status On-going    Target Date 05/14/21      PT LONG TERM GOAL #2   Title Patient (< 14 years old) will complete five times sit to stand test in < 20 seconds indicating an increased LE strength and improved balance.    Baseline 6/29: 23.8 sec with 2HHA. 11/15/2020= 48 sec with BUE Support and CGA using  gait belt. 8/24: 23 seconds with 2 HHA 10/17: 19.23 seconds with BUE support 12/7: 20.21s BUE    Time 8    Period Weeks    Status On-going    Target Date 05/14/21  PT LONG TERM GOAL #3   Title Patient will increase 10 meter walk test to >0.15 m/s (60 sec or less) as to improve gait speed for better home ambulation and to reduce fall risk.    Baseline 6/29: 0.08 m/s with RW. 11/15/2020= 0.093 m/s with RW 8/24: 0.15 m/s 12/7= 0.50ms with RW    Time 8    Period Weeks    Status Achieved      PT LONG TERM GOAL #4   Title Patient will increase BLE gross strength to 4+/5 as to improve functional strength for independent gait, increased standing tolerance and increased ADL ability.    Baseline 6/29: see flowsheet. 8/24: hips grossly 4-/5 with hip extension 2+/5; L knee flexion 2+/5 knee extension 4/5; R knee 3+/5 10/17: see note 12/7: see note    Time 8    Period Weeks    Status Partially Met    Target Date 05/14/21      PT LONG TERM GOAL #5   Title Patient will ambulate 250 ft with least assistive device and Supervision with no breaks to allow for increased mobility within home.    Baseline 11/15/2020-Ambulation distance = approx 110 feet using pediatric bariatric walker 8/24: 155 ft with bariatric RW 10/17: 170 ft with bariatric RW 12/7: 1765fwith bariatric RW, see note    Time 8    Period Weeks    Status Partially Met    Target Date 05/14/21      PT LONG TERM GOAL #6   Title Patient will increase 10 meter walk test to >0.5 m/s ( 30 seconds) as to improve gait speed for better home ambulation and to reduce fall risk.    Baseline 8/24: 61 seconds: 0.15 m/s 10/17: 64 seconds ~0.15 m/s 12/7: 0.1973m(51.5s)    Time 8    Period Weeks    Status Partially Met    Target Date 05/14/21      PT LONG TERM GOAL #7   Title Patient (< 60 73ars old) will complete five times sit to stand test in < 20 seconds with SUE support  indicating an increased LE strength and improved balance.    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 He performed well with today's session- able to complete therex and demonstrating fatigue as limiting factor. He ambulated more overall today  vs last session but continues to exhibit fatigue with increased distance walking. Patient will  benefit from additional skilled intervention to improve strength and mobility to tolerance for improved household and community distance with improved quality of life    Personal Factors and Comorbidities Comorbidity 3+;Education;Past/Current Experience;Time since onset of injury/illness/exacerbation;Transportation    Comorbidities HTN, arthritis, past CVA, high fall risk, obesity    Examination-Activity Limitations Bed Mobility;Locomotion Level;Squat;Stairs;Stand;Toileting;Transfers    Examination-Participation Restrictions Community Activity;Driving;Cleaning;Meal Prep;Shop;Volunteer;Yard Work    Stability/Clinical Decision Making Stable/Uncomplicated    Rehab Potential Good    PT Frequency 2x / week    PT Duration 8 weeks    PT Treatment/Interventions ADLs/Self  Care Home Management;Cryotherapy;Moist Heat;Gait training;Stair training;Functional mobility training;Therapeutic activities;Therapeutic exercise;Balance training;Neuromuscular re-education;Patient/family education;Orthotic Fit/Training;Passive range of motion;Energy conservation    PT Next Visit Plan Continue with progessive LE strengthening and gait/transfer training.    PT Home Exercise Plan no updates  this session; Reminder to patient to continue with daily walking    Consulted and Agree with Plan of Care Patient             Patient  will benefit from skilled therapeutic intervention in order to improve the following deficits and impairments:  Abnormal gait, Decreased balance, Decreased endurance, Decreased mobility, Difficulty walking, Hypomobility, Obesity, Improper body mechanics, Decreased activity tolerance, Decreased strength, Postural dysfunction  Visit Diagnosis: Abnormality of gait and mobility  Difficulty in walking, not elsewhere classified  Muscle weakness (generalized)  Unsteadiness on feet     Problem List Patient Active Problem List   Diagnosis Date Noted   Impaired glucose tolerance 03/23/2020   Learning disability 03/23/2020   Urinary and fecal incontinence 06/14/2019   Chronic pain syndrome 03/23/2019   Constipation 03/23/2019   Dysarthria 03/23/2019   Dysphagia 03/23/2019   Learning difficulty 03/23/2019   Morbid obesity ([place]) 03/23/2019   Muscle weakness 03/23/2019   Vitamin D deficiency 03/23/2019   Bilateral carotid artery stenosis 12/15/2018   Moderate aortic valve stenosis 12/15/2018   BMI 45.0-49.9, adult ([place]) 11/10/2018   Cerebrovascular accident ([place]) 05/02/2016   Hemiparesis affecting right side as late effect of cerebrovascular accident ([place]) 05/01/2016   Brain tumor ([place]) 11/24/2014   Gastro-esophageal reflux disease without esophagitis 11/24/2014   Accident due to mechanical fall without injury 08/03/2014   Essential hypertension 06/13/2014   Mixed hyperlipidemia 06/13/2014   Unspecified sequelae of cerebral infarction 06/13/2014   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

## 2021-04-30 ENCOUNTER — Ambulatory Visit: Payer: Medicare Other

## 2021-05-02 ENCOUNTER — Other Ambulatory Visit: Payer: Self-pay

## 2021-05-02 ENCOUNTER — Ambulatory Visit: Payer: Medicare Other

## 2021-05-02 DIAGNOSIS — R269 Unspecified abnormalities of gait and mobility: Secondary | ICD-10-CM

## 2021-05-02 DIAGNOSIS — M6281 Muscle weakness (generalized): Secondary | ICD-10-CM

## 2021-05-02 DIAGNOSIS — R262 Difficulty in walking, not elsewhere classified: Secondary | ICD-10-CM

## 2021-05-02 DIAGNOSIS — R2681 Unsteadiness on feet: Secondary | ICD-10-CM

## 2021-05-02 NOTE — Therapy (Signed)
[place] MAIN [facility] SERVICES [address] [place], [place], [address] Phone: [phone]   Fax:  [phone]  Physical Therapy Treatment  Patient Details  Name: Curtis Powell MRN: [id number] Date of Birth: 09/26/1965 Referring Provider (PT): Kym Groom MD   Encounter Date: 05/02/2021   PT End of Session - 05/02/21 1440     Visit Number 62    Number of Visits 76    Date for PT Re-Evaluation 05/14/21    Authorization Type UHC Medicare; [place] Medicaid    PT Start Time 1300    PT Stop Time 1344    PT Time Calculation (min) 44 min    Equipment Utilized During Treatment Gait belt    Activity Tolerance Patient limited by fatigue;Patient tolerated treatment well    Behavior During Therapy WFL for tasks assessed/performed             Past Medical History:  Diagnosis Date   GERD (gastroesophageal reflux disease)    Hyperlipidemia    Hypertension    Obesity    Stroke [facility])     Past Surgical History:  Procedure Laterality Date   BRAIN SURGERY      There were no vitals filed for this visit.   Subjective Assessment - 05/02/21 1436     Subjective Patient presents with caregiver. Reports no falls or LOB since last session.    Pertinent History Patient is a pleasant 56 year old male who presents to physical therapy for weakness and immobility secondary to CVA.  Had a stroke in Feb 2018. He was previously fully independent, but this stroke caused severe residual deficits, mainly on the right side. He reports difficulty walking and decreased mobility. His mom had to move in with him and now is his main caregiver.  Patient had outpatient PT from November 2018-March 2022. Patient reports since being discharged from outpatient therapy he has kept up with his HEP daily. He does report desire for walking better and for getting in and out of bed easier. He reports his mom is helping with some ADLs. He reports needing help with rolling in bed. He reports  his mom/aide assist with dressing. He has a home aide 3 days out of the week;    Limitations Standing;Walking    How long can you sit comfortably? NA    How long can you stand comfortably? <30 min, does minimal standing;    How long can you walk comfortably? depends on the day, fatigue affects his walking; He is walking every day, about 15-20 min;    Diagnostic tests None recent    Patient Stated Goals to walk better and be independent in bed mobility;    Currently in Pain? No/denies                Patient reports 4/10 pain in knees. Has been walking at home.   INTERVENTIONS:    Therapeutic Exercises:    Patient required min A to transfer sit<>Stand with RW (used bariatric RW) Instructed patient in gait on level surface with CGA and close w/c follow x 50 feet x 2 trials with one seated rest break.  Patient exhibits forward flexed posture and increased lateral trunk lean during swing phase due to gluteal weakness. He required min VCs for increased erect posture and to increase hip flexion during swing for better step length. Patient able to exhibit reciprocal gait pattern throughout walking with intermittent normal step length.    Seated RTB hamstring curl 15x  each  Trunk extension 15x with arms crossed over back of chair   Neuro Re-ed:    stading with walker in front of patient and table in front of walker:  -transfer cones from one side of the table to the other based on PT guidance x 5 minutes -reach for cone outside BOS and transfer to opposite UE and place on table x 8 each UE; 3 near LOB posterior -shadow boxing x 2 minutes; two near LOB posterior   Pt educated throughout session about proper posture and technique with exercises. Improved exercise technique, movement at target joints, use of target muscles after min to mod verbal, visual, tactile cues.      Patient requires frequent cueing for upright posture and foot clearance with ambulation. Vitals monitored  throughout session and patient's caregiver educated on performing standing interventions at home for carryover. Patient tolerates standing interventions with UE reach well. Patient will benefit from additional skilled intervention to improve strength and mobility to tolerance for improved household and community distance with improved quality of life            PT Education - 05/02/21 1440     Education Details exercise technique, upright posture    Person(s) Educated Patient    Methods Explanation;Tactile cues;Demonstration;Verbal cues    Comprehension Verbalized understanding;Returned demonstration;Verbal cues required;Tactile cues required              PT Short Term Goals - 03/19/21 1458       PT SHORT TERM GOAL #1   Title Patient will be adherent to HEP at least 3x a week to improve functional strength and balance for better safety at home.    Baseline 11/15/2020- Patient reports performing seated LE hip march, knee ext, sit to stand and walking daily and states no questions. Advised patient to practice static standing using his walker or kitchen counter several times per day without UE support.    Time 4    Period Weeks    Status Achieved    Target Date 11/01/20      PT SHORT TERM GOAL #2   Title Patient will be independent in bed mobility including sit to supine utilizing bed rails as needed for increased mobility at home.    Baseline 6/29: requires min A 8/24: patient reports needing mod A 12/7: patient reports needing min<>mod A    Time 4    Period Weeks    Status On-going    Target Date 04/16/21               PT Long Term Goals - 03/19/21 1452       PT LONG TERM GOAL #1   Title Patient will improve FOTO score to > 50% to indicate improve functional mobility at home and in community.    Baseline 6/29: 42% 8/24: 40% 10/17: 40% 12/12: 46%    Time 8    Period Weeks    Status On-going    Target Date 05/14/21      PT LONG TERM GOAL #2   Title Patient (<  30 years old) will complete five times sit to stand test in < 20 seconds indicating an increased LE strength and improved balance.    Baseline 6/29: 23.8 sec with 2HHA. 11/15/2020= 48 sec with BUE Support and CGA using gait belt. 8/24: 23 seconds with 2 HHA 10/17: 19.23 seconds with BUE support 12/7: 20.21s BUE    Time 8    Period Weeks    Status  On-going    Target Date 05/14/21      PT LONG TERM GOAL #3   Title Patient will increase 10 meter walk test to >0.15 m/s (60 sec or less) as to improve gait speed for better home ambulation and to reduce fall risk.    Baseline 6/29: 0.08 m/s with RW. 11/15/2020= 0.093 m/s with RW 8/24: 0.15 m/s 12/7= 0.30ms with RW    Time 8    Period Weeks    Status Achieved      PT LONG TERM GOAL #4   Title Patient will increase BLE gross strength to 4+/5 as to improve functional strength for independent gait, increased standing tolerance and increased ADL ability.    Baseline 6/29: see flowsheet. 8/24: hips grossly 4-/5 with hip extension 2+/5; L knee flexion 2+/5 knee extension 4/5; R knee 3+/5 10/17: see note 12/7: see note    Time 8    Period Weeks    Status Partially Met    Target Date 05/14/21      PT LONG TERM GOAL #5   Title Patient will ambulate 250 ft with least assistive device and Supervision with no breaks to allow for increased mobility within home.    Baseline 11/15/2020-Ambulation distance = approx 110 feet using pediatric bariatric walker 8/24: 155 ft with bariatric RW 10/17: 170 ft with bariatric RW 12/7: 1736fwith bariatric RW, see note    Time 8    Period Weeks    Status Partially Met    Target Date 05/14/21      PT LONG TERM GOAL #6   Title Patient will increase 10 meter walk test to >0.5 m/s ( 30 seconds) as to improve gait speed for better home ambulation and to reduce fall risk.    Baseline 8/24: 61 seconds: 0.15 m/s 10/17: 64 seconds ~0.15 m/s 12/7: 0.1933m(51.5s)    Time 8    Period Weeks    Status Partially Met    Target Date  05/14/21      PT LONG TERM GOAL #7   Title Patient (< 60 86ars old) will complete five times sit to stand test in < 20 seconds with SUE support  indicating an increased LE strength and improved balance.    Baseline 10/17: 19.23 seconds with BUE support 12/7: 20.21s with SUE on RW, SUE on chair    Time 8    Period Weeks    Status On-going    Target Date 05/14/21                   Plan - 05/02/21 1441     Clinical Impression Statement Patient requires frequent cueing for upright posture and foot clearance with ambulation. Vitals monitored throughout session and patient's caregiver educated on performing standing interventions at home for carryover. Patient tolerates standing interventions with UE reach well. Patient will benefit from additional skilled intervention to improve strength and mobility to tolerance for improved household and community distance with improved quality of life    Personal Factors and Comorbidities Comorbidity 3+;Education;Past/Current Experience;Time since onset of injury/illness/exacerbation;Transportation    Comorbidities HTN, arthritis, past CVA, high fall risk, obesity    Examination-Activity Limitations Bed Mobility;Locomotion Level;Squat;Stairs;Stand;Toileting;Transfers    Examination-Participation Restrictions Community Activity;Driving;Cleaning;Meal Prep;Shop;Volunteer;Yard Work    Stability/Clinical Decision Making Stable/Uncomplicated    Rehab Potential Good    PT Frequency 2x / week    PT Duration 8 weeks    PT Treatment/Interventions ADLs/Self Care Home Management;Cryotherapy;Moist Heat;Gait training;Stair training;Functional mobility training;Therapeutic activities;Therapeutic  exercise;Balance training;Neuromuscular re-education;Patient/family education;Orthotic Fit/Training;Passive range of motion;Energy conservation    PT Next Visit Plan Continue with progessive LE strengthening and gait/transfer training.    PT Home Exercise Plan no updates   this session; Reminder to patient to continue with daily walking    Consulted and Agree with Plan of Care Patient             Patient will benefit from skilled therapeutic intervention in order to improve the following deficits and impairments:  Abnormal gait, Decreased balance, Decreased endurance, Decreased mobility, Difficulty walking, Hypomobility, Obesity, Improper body mechanics, Decreased activity tolerance, Decreased strength, Postural dysfunction  Visit Diagnosis: Abnormality of gait and mobility  Difficulty in walking, not elsewhere classified  Muscle weakness (generalized)  Unsteadiness on feet     Problem List Patient Active Problem List   Diagnosis Date Noted   Impaired glucose tolerance 03/23/2020   Learning disability 03/23/2020   Urinary and fecal incontinence 06/14/2019   Chronic pain syndrome 03/23/2019   Constipation 03/23/2019   Dysarthria 03/23/2019   Dysphagia 03/23/2019   Learning difficulty 03/23/2019   Morbid obesity ([place]) 03/23/2019   Muscle weakness 03/23/2019   Vitamin D deficiency 03/23/2019   Bilateral carotid artery stenosis 12/15/2018   Moderate aortic valve stenosis 12/15/2018   BMI 45.0-49.9, adult ([place]) 11/10/2018   Cerebrovascular accident ([place]) 05/02/2016   Hemiparesis affecting right side as late effect of cerebrovascular accident ([place]) 05/01/2016   Brain tumor ([place]) 11/24/2014   Gastro-esophageal reflux disease without esophagitis 11/24/2014   Accident due to mechanical fall without injury 08/03/2014   Essential hypertension 06/13/2014   Mixed hyperlipidemia 06/13/2014   Unspecified sequelae of cerebral infarction 06/13/2014   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/2014   Type 2 diabetes mellitus without complication ([place]) 24/02/4642   Neck mass 09/08/2013   Swimmer's ear 09/08/2013   Erectile dysfunction 01/03/2013   Prediabetes 01/03/2013   Janna Arch, PT, DPT  05/02/2021, 2:42 PM  [place] [facility] MAIN [facility]  SERVICES [address] [place], [place], [address] Phone: [phone]   Fax:  [phone]  Name: Curtis Powell MRN: [id number] Date of Birth: 26-May-1965

## 2021-05-07 ENCOUNTER — Ambulatory Visit: Payer: Medicare Other

## 2021-05-09 ENCOUNTER — Ambulatory Visit: Payer: Medicare Other | Attending: Family Medicine | Admitting: Physical Therapy

## 2021-05-09 ENCOUNTER — Ambulatory Visit: Payer: Medicare Other

## 2021-05-09 ENCOUNTER — Other Ambulatory Visit: Payer: Self-pay

## 2021-05-09 DIAGNOSIS — I69351 Hemiplegia and hemiparesis following cerebral infarction affecting right dominant side: Secondary | ICD-10-CM | POA: Insufficient documentation

## 2021-05-09 DIAGNOSIS — R278 Other lack of coordination: Secondary | ICD-10-CM | POA: Insufficient documentation

## 2021-05-09 DIAGNOSIS — I639 Cerebral infarction, unspecified: Secondary | ICD-10-CM | POA: Diagnosis present

## 2021-05-09 DIAGNOSIS — M6281 Muscle weakness (generalized): Secondary | ICD-10-CM | POA: Insufficient documentation

## 2021-05-09 DIAGNOSIS — R2681 Unsteadiness on feet: Secondary | ICD-10-CM | POA: Diagnosis present

## 2021-05-09 DIAGNOSIS — R262 Difficulty in walking, not elsewhere classified: Secondary | ICD-10-CM | POA: Diagnosis present

## 2021-05-09 DIAGNOSIS — R269 Unspecified abnormalities of gait and mobility: Secondary | ICD-10-CM | POA: Insufficient documentation

## 2021-05-09 DIAGNOSIS — R2689 Other abnormalities of gait and mobility: Secondary | ICD-10-CM | POA: Diagnosis present

## 2021-05-09 DIAGNOSIS — R296 Repeated falls: Secondary | ICD-10-CM | POA: Insufficient documentation

## 2021-05-09 DIAGNOSIS — R531 Weakness: Secondary | ICD-10-CM | POA: Insufficient documentation

## 2021-05-09 NOTE — Therapy (Signed)
[place] MAIN [facility] SERVICES [address] [place], [place], [address] Phone: [phone]   Fax:  [phone]  Physical Therapy Treatment/Physical Therapy Progress Note   Dates of reporting period  03/14/21   to   05/09/21  Patient Details  Name: Curtis Powell MRN: [id number] Date of Birth: April 19, 1965 Referring Provider (PT): Kym Groom MD   Encounter Date: 05/09/2021   PT End of Session - 05/09/21 1350     Visit Number 50    Number of Visits 2    Date for PT Re-Evaluation 05/14/21    Authorization Type UHC Medicare; [place] Medicaid    PT Start Time 1301    PT Stop Time 1345    PT Time Calculation (min) 44 min    Equipment Utilized During Treatment Gait belt    Activity Tolerance Patient limited by fatigue;Patient tolerated treatment well    Behavior During Therapy WFL for tasks assessed/performed             Past Medical History:  Diagnosis Date   GERD (gastroesophageal reflux disease)    Hyperlipidemia    Hypertension    Obesity    Stroke [facility])     Past Surgical History:  Procedure Laterality Date   BRAIN SURGERY      There were no vitals filed for this visit.   Subjective Assessment - 05/09/21 1304     Subjective Patient presents with caregiver. Reports no falls or LOB since last session. States he has been compliant with HEP and walking at home regularly.    Pertinent History Patient is a pleasant 56 year old male who presents to physical therapy for weakness and immobility secondary to CVA.  Had a stroke in Feb 2018. He was previously fully independent, but this stroke caused severe residual deficits, mainly on the right side. He reports difficulty walking and decreased mobility. His mom had to move in with him and now is his main caregiver.  Patient had outpatient PT from November 2018-March 2022. Patient reports since being discharged from outpatient therapy he has kept up with his HEP daily. He does report desire for walking  better and for getting in and out of bed easier. He reports his mom is helping with some ADLs. He reports needing help with rolling in bed. He reports his mom/aide assist with dressing. He has a home aide 3 days out of the week;    Limitations Standing;Walking    How long can you sit comfortably? NA    How long can you stand comfortably? <30 min, does minimal standing;    How long can you walk comfortably? depends on the day, fatigue affects his walking; He is walking every day, about 15-20 min;    Diagnostic tests None recent    Patient Stated Goals to walk better and be independent in bed mobility;    Currently in Pain? No/denies               Goals:  Bed mobility (independent) = MOD A for BLE assist into bed FOTO (50) = 34 5xSTS (<20s with SUE) = 35 seconds  BLE strength (4+/5) = 4+-5/5 except R hamstring 4/5; strong through incomplete ROM (hip extension not tested) 212f w/ SUP and no breaks = 841fwith CGA; total of 14574f 10MWT (>0.37m/6m 30 seconds) = 2 minutes 33 seconds     Interventions Standing at support bar with BUE support and CGA: -alternating march, decreased ROM on RLE, 2 x 10  BLE -lateral steps, 2 x 10 in each direction      Pt is pleasant and motivated during session; caregiver present and encouraging. Goals were assessed for progress note this date. Pt presents with good strength in BLE however through incomplete ROM. MMT performed in sitting. Pt had difficulty with other tests today; gait velocity, ambulation endurance and 5xSTS scores show regression. He has also depreciated subjectively according to FOTO. Gait velocity in 2nd bout of walking appeared to increase compared to timed distance, however this was not measured. Muscle fatigue required multiple seated rest breaks throughout session. He did spend the majority of the session performing standing activities indicating an improvement in standing tolerance.   Patient's condition has the potential to  improve in response to therapy. Maximum improvement is yet to be obtained. The anticipated improvement is attainable and reasonable in a generally predictable time.              PT Short Term Goals - 05/09/21 1407       PT SHORT TERM GOAL #1   Title Patient will be adherent to HEP at least 3x a week to improve functional strength and balance for better safety at home.    Baseline 11/15/2020- Patient reports performing seated LE hip march, knee ext, sit to stand and walking daily and states no questions. Advised patient to practice static standing using his walker or kitchen counter several times per day without UE support.    Time 4    Period Weeks    Status Achieved    Target Date 11/01/20      PT SHORT TERM GOAL #2   Title Patient will be independent in bed mobility including sit to supine utilizing bed rails as needed for increased mobility at home.    Baseline 6/29: requires min A 8/24: patient reports needing mod A 12/7: patient reports needing min<>mod A; 05/09/21 MOD A for BLE assist during sit>sup    Time 4    Period Weeks    Status On-going    Target Date 06/06/21               PT Long Term Goals - 05/09/21 1408       PT LONG TERM GOAL #1   Title Patient will improve FOTO score to > 50% to indicate improve functional mobility at home and in community.    Baseline 6/29: 42% 8/24: 40% 10/17: 40% 12/12: 46%; 05/09/21: 34%    Time 8    Period Weeks    Status On-going    Target Date 07/04/21      PT LONG TERM GOAL #2   Title Patient (< 54 years old) will complete five times sit to stand test in < 20 seconds indicating an increased LE strength and improved balance.    Baseline 6/29: 23.8 sec with 2HHA. 11/15/2020= 48 sec with BUE Support and CGA using gait belt. 8/24: 23 seconds with 2 HHA 10/17: 19.23 seconds with BUE support 12/7: 20.21s BUE; 05/09/21: 35 seconds with BUE support and support using gait belt    Time 8    Period Weeks    Status On-going    Target Date  07/04/21      PT LONG TERM GOAL #3   Title Patient will increase 10 meter walk test to >0.15 m/s (60 sec or less) as to improve gait speed for better home ambulation and to reduce fall risk.    Baseline 6/29: 0.08 m/s with RW. 11/15/2020= 0.093 m/s  with RW 8/24: 0.15 m/s 12/7= 0.63ms with RW; 05/09/21: 0.066m w/ RW    Time 8    Period Weeks    Status Achieved    Target Date 07/04/21      PT LONG TERM GOAL #4   Title Patient will increase BLE gross strength to 4+/5 as to improve functional strength for independent gait, increased standing tolerance and increased ADL ability.    Baseline 6/29: see flowsheet. 8/24: hips grossly 4-/5 with hip extension 2+/5; L knee flexion 2+/5 knee extension 4/5; R knee 3+/5 10/17: see note 12/7: see note; 05/09/21: see note    Time 8    Period Weeks    Status Partially Met    Target Date 07/04/21      PT LONG TERM GOAL #5   Title Patient will ambulate 250 ft with least assistive device and Supervision with no breaks to allow for increased mobility within home.    Baseline 11/15/2020-Ambulation distance = approx 110 feet using pediatric bariatric walker 8/24: 155 ft with bariatric RW 10/17: 170 ft with bariatric RW 12/7: 17053fith bariatric RW, see note; 05/09/21: 49f4fth bariatric RW    Time 8    Period Weeks    Status Partially Met    Target Date 07/04/21      PT LONG TERM GOAL #6   Title Patient will increase 10 meter walk test to >0.5 m/s ( 30 seconds) as to improve gait speed for better home ambulation and to reduce fall risk.    Baseline 8/24: 61 seconds: 0.15 m/s 10/17: 64 seconds ~0.15 m/s 12/7: 0.53m/76m1.5s); 05/09/21: 0.33m/s41mTime 8    Period Weeks    Status Partially Met    Target Date 07/04/21      PT LONG TERM GOAL #7   Title Patient (< 60 yea48 old) will complete five times sit to stand test in < 20 seconds with SUE support  indicating an increased LE strength and improved balance.    Baseline 10/17: 19.23 seconds with BUE support 12/7:  20.21s with SUE on RW, SUE on chair; 05/09/21: 35 seconds    Time 8    Period Weeks    Status On-going    Target Date 07/04/21                   Plan - 05/09/21 1406     Clinical Impression Statement Pt is pleasant and motivated during session; caregiver present and encouraging. Goals were assessed for progress note this date. Pt presents with good strength in BLE however through incomplete ROM. MMT performed in sitting. Pt had difficulty with other tests today; gait velocity, ambulation endurance and 5xSTS scores show regression. He has also depreciated subjectively according to FOTO. Gait velocity in 2nd bout of walking appeared to increase compared to timed distance, however this was not measured. Muscle fatigue required multiple seated rest breaks throughout session. He did spend the majority of the session performing standing activities indicating an improvement in standing tolerance. Patient's condition has the potential to improve in response to therapy. Maximum improvement is yet to be obtained. The anticipated improvement is attainable and reasonable in a generally predictable time.    Personal Factors and Comorbidities Comorbidity 3+;Education;Past/Current Experience;Time since onset of injury/illness/exacerbation;Transportation    Comorbidities HTN, arthritis, past CVA, high fall risk, obesity    Examination-Activity Limitations Bed Mobility;Locomotion Level;Squat;Stairs;Stand;Toileting;Transfers    Examination-Participation Restrictions Community Activity;Driving;Cleaning;Meal Prep;Shop;Volunteer;Yard Work    Stability/Clinical Decision Making Stable/Uncomplicated  Rehab Potential Good    PT Frequency 2x / week    PT Duration 8 weeks    PT Treatment/Interventions ADLs/Self Care Home Management;Cryotherapy;Moist Heat;Gait training;Stair training;Functional mobility training;Therapeutic activities;Therapeutic exercise;Balance training;Neuromuscular re-education;Patient/family  education;Orthotic Fit/Training;Passive range of motion;Energy conservation    PT Next Visit Plan Continue with progessive LE strengthening and gait/transfer training.    PT Home Exercise Plan no updates  this session; Reminder to patient to continue with daily walking    Consulted and Agree with Plan of Care Patient             Patient will benefit from skilled therapeutic intervention in order to improve the following deficits and impairments:  Abnormal gait, Decreased balance, Decreased endurance, Decreased mobility, Difficulty walking, Hypomobility, Obesity, Improper body mechanics, Decreased activity tolerance, Decreased strength, Postural dysfunction  Visit Diagnosis: Abnormality of gait and mobility  Difficulty in walking, not elsewhere classified  Muscle weakness (generalized)  Unsteadiness on feet  Other lack of coordination  Repeated falls  Other abnormalities of gait and mobility  Left-sided weakness  Hemiplegia and hemiparesis following cerebral infarction affecting right dominant side (HCC)  Acute CVA (cerebrovascular accident) [facility])     Problem List Patient Active Problem List   Diagnosis Date Noted   Impaired glucose tolerance 03/23/2020   Learning disability 03/23/2020   Urinary and fecal incontinence 06/14/2019   Chronic pain syndrome 03/23/2019   Constipation 03/23/2019   Dysarthria 03/23/2019   Dysphagia 03/23/2019   Learning difficulty 03/23/2019   Morbid obesity ([place]) 03/23/2019   Muscle weakness 03/23/2019   Vitamin D deficiency 03/23/2019   Bilateral carotid artery stenosis 12/15/2018   Moderate aortic valve stenosis 12/15/2018   BMI 45.0-49.9, adult ([place]) 11/10/2018   Cerebrovascular accident ([place]) 05/02/2016   Hemiparesis affecting right side as late effect of cerebrovascular accident ([place]) 05/01/2016   Brain tumor ([place]) 11/24/2014   Gastro-esophageal reflux disease without esophagitis 11/24/2014   Accident due to mechanical fall  without injury 08/03/2014   Essential hypertension 06/13/2014   Mixed hyperlipidemia 06/13/2014   Unspecified sequelae of cerebral infarction 06/13/2014   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/2014   Type 2 diabetes mellitus without complication ([place]) 78/05/889   Neck mass 09/08/2013   Swimmer's ear 09/08/2013   Erectile dysfunction 01/03/2013   Prediabetes 01/03/2013    Patrina Levering PT, DPT 05/09/21 2:16 PM [place] MAIN [facility] SERVICES [address] [place], [place], [address] Phone: [phone]   Fax:  [phone]  Name: ALOYS HUPFER MRN: [id number] Date of Birth: July 28, 1965

## 2021-05-14 ENCOUNTER — Encounter: Payer: Self-pay | Admitting: Physical Therapy

## 2021-05-14 ENCOUNTER — Ambulatory Visit: Payer: Medicare Other | Admitting: Physical Therapy

## 2021-05-14 ENCOUNTER — Other Ambulatory Visit: Payer: Self-pay

## 2021-05-14 ENCOUNTER — Ambulatory Visit: Payer: Medicare Other

## 2021-05-14 DIAGNOSIS — I69351 Hemiplegia and hemiparesis following cerebral infarction affecting right dominant side: Secondary | ICD-10-CM

## 2021-05-14 DIAGNOSIS — R262 Difficulty in walking, not elsewhere classified: Secondary | ICD-10-CM

## 2021-05-14 DIAGNOSIS — R278 Other lack of coordination: Secondary | ICD-10-CM

## 2021-05-14 DIAGNOSIS — R269 Unspecified abnormalities of gait and mobility: Secondary | ICD-10-CM | POA: Diagnosis not present

## 2021-05-14 DIAGNOSIS — I639 Cerebral infarction, unspecified: Secondary | ICD-10-CM

## 2021-05-14 DIAGNOSIS — R2681 Unsteadiness on feet: Secondary | ICD-10-CM

## 2021-05-14 DIAGNOSIS — M6281 Muscle weakness (generalized): Secondary | ICD-10-CM

## 2021-05-14 DIAGNOSIS — R531 Weakness: Secondary | ICD-10-CM

## 2021-05-14 DIAGNOSIS — R2689 Other abnormalities of gait and mobility: Secondary | ICD-10-CM

## 2021-05-14 DIAGNOSIS — R296 Repeated falls: Secondary | ICD-10-CM

## 2021-05-14 NOTE — Therapy (Signed)
[place] MAIN [facility] SERVICES [address] [place], [place], [address] Phone: [phone]   Fax:  [phone]  Physical Therapy Treatment  Patient Details  Name: Curtis Powell MRN: [id number] Date of Birth: [birth year] Referring Provider (PT): Kym Groom MD   Encounter Date: 05/14/2021   PT End of Session - 05/14/21 1533     Visit Number 4    Number of Visits 2    Date for PT Re-Evaluation 05/14/21    Authorization Type [employer] Medicare; [place] Medicaid    PT Start Time 1345    PT Stop Time 1430    PT Time Calculation (min) 45 min    Equipment Utilized During Treatment Gait belt    Activity Tolerance Patient limited by fatigue;Patient tolerated treatment well    Behavior During Therapy WFL for tasks assessed/performed             Past Medical History:  Diagnosis Date   GERD (gastroesophageal reflux disease)    Hyperlipidemia    Hypertension    Obesity    Stroke [facility])     Past Surgical History:  Procedure Laterality Date   BRAIN SURGERY      There were no vitals filed for this visit.   Subjective Assessment - 05/14/21 1532     Subjective Patient presents with aunt. Reports no falls/LOB since last session. Reports compliance with HEP and walking regularly at home.    Pertinent History Patient is a pleasant 56 year old male who presents to physical therapy for weakness and immobility secondary to CVA.  Had a stroke in Feb 2018. He was previously fully independent, but this stroke caused severe residual deficits, mainly on the right side. He reports difficulty walking and decreased mobility. His mom had to move in with him and now is his main caregiver.  Patient had outpatient PT from November 2018-March 2022. Patient reports since being discharged from outpatient therapy he has kept up with his HEP daily. He does report desire for walking better and for getting in and out of bed easier. He reports his mom is helping with some ADLs. He  reports needing help with rolling in bed. He reports his mom/aide assist with dressing. He has a home aide 3 days out of the week;    Limitations Standing;Walking    How long can you sit comfortably? NA    How long can you stand comfortably? <30 min, does minimal standing;    How long can you walk comfortably? depends on the day, fatigue affects his walking; He is walking every day, about 15-20 min;    Diagnostic tests None recent    Patient Stated Goals to walk better and be independent in bed mobility;    Currently in Pain? No/denies                 INTERVENTIONS:    Therapeutic Exercises:    Patient required min A to transfer sit<>Stand with RW (used bariatric RW). Ambulation on level surface with CGA and close w/c follow x 70 feet x 2 trials with one seated rest break.  Patient exhibits forward flexed posture and increased lateral trunk lean during swing phase due to gluteal weakness. He required VCs for improved posture and to increase hip flexion during swing for better step length. Patient able to exhibit reciprocal gait pattern throughout walking with intermittent normal step length (beginning prior to onset of fatigue).   Seated in w/c: - LAQ x 20 reps each side  -  hip flexion march x 20 reps each BRB - hip abduction x20 reps BRB; VC for increased RLE activation.     Neuro Re-ed:    stading with walker: - ipsilateral reach for ball with contralateral trunk rotation to PT positioned on opposite side. PT challenged pt with increased rotation. VC to visualize PT hand as pass off target. x18 bilaterally.  Pt challenged by standing endurance.     Pt educated throughout session about proper posture and technique with exercises. Improved exercise technique, movement at target joints, use of target muscles after min to mod verbal, visual, tactile cues.    Pt pleasant and motivated during session. Gait training, endurance, balance and strength were addressed. Pt was limited  by standing endurance tolerance; he required frequent and extended rest breaks between and within exercises. He lacks coordination of RLE hip flexion during swing phase of gait that leads to compensations with hip hike and circumduction. Patient will benefit from additional skilled intervention to improve strength and mobility to tolerance for improved household and community distance with improved quality of life.           PT Short Term Goals - 05/09/21 1407       PT SHORT TERM GOAL #1   Title Patient will be adherent to HEP at least 3x a week to improve functional strength and balance for better safety at home.    Baseline 11/15/2020- Patient reports performing seated LE hip march, knee ext, sit to stand and walking daily and states no questions. Advised patient to practice static standing using his walker or kitchen counter several times per day without UE support.    Time 4    Period Weeks    Status Achieved    Target Date 11/01/20      PT SHORT TERM GOAL #2   Title Patient will be independent in bed mobility including sit to supine utilizing bed rails as needed for increased mobility at home.    Baseline 6/29: requires min A 8/24: patient reports needing mod A 12/7: patient reports needing min<>mod A; 05/09/21 MOD A for BLE assist during sit>sup    Time 4    Period Weeks    Status On-going    Target Date 06/06/21               PT Long Term Goals - 05/09/21 1408       PT LONG TERM GOAL #1   Title Patient will improve FOTO score to > 50% to indicate improve functional mobility at home and in community.    Baseline 6/29: 42% 8/24: 40% 10/17: 40% 12/12: 46%; 05/09/21: 34%    Time 8    Period Weeks    Status On-going    Target Date 07/04/21      PT LONG TERM GOAL #2   Title Patient (< 20 years old) will complete five times sit to stand test in < 20 seconds indicating an increased LE strength and improved balance.    Baseline 6/29: 23.8 sec with 2HHA. 11/15/2020= 48 sec  with BUE Support and CGA using gait belt. 8/24: 23 seconds with 2 HHA 10/17: 19.23 seconds with BUE support 12/7: 20.21s BUE; 05/09/21: 35 seconds with BUE support and support using gait belt    Time 8    Period Weeks    Status On-going    Target Date 07/04/21      PT LONG TERM GOAL #3   Title Patient will increase 10 meter walk test to >  0.15 m/s (60 sec or less) as to improve gait speed for better home ambulation and to reduce fall risk.    Baseline 6/29: 0.08 m/s with RW. 11/15/2020= 0.093 m/s with RW 8/24: 0.15 m/s 12/7= 0.27ms with RW; 05/09/21: 0.044m w/ RW    Time 8    Period Weeks    Status Achieved    Target Date 07/04/21      PT LONG TERM GOAL #4   Title Patient will increase BLE gross strength to 4+/5 as to improve functional strength for independent gait, increased standing tolerance and increased ADL ability.    Baseline 6/29: see flowsheet. 8/24: hips grossly 4-/5 with hip extension 2+/5; L knee flexion 2+/5 knee extension 4/5; R knee 3+/5 10/17: see note 12/7: see note; 05/09/21: see note    Time 8    Period Weeks    Status Partially Met    Target Date 07/04/21      PT LONG TERM GOAL #5   Title Patient will ambulate 250 ft with least assistive device and Supervision with no breaks to allow for increased mobility within home.    Baseline 11/15/2020-Ambulation distance = approx 110 feet using pediatric bariatric walker 8/24: 155 ft with bariatric RW 10/17: 170 ft with bariatric RW 12/7: [id number] bariatric RW, see note; 05/09/21: [id number] bariatric RW    Time 8    Period Weeks    Status Partially Met    Target Date 07/04/21      PT LONG TERM GOAL #6   Title Patient will increase 10 meter walk test to >0.5 m/s ( 30 seconds) as to improve gait speed for better home ambulation and to reduce fall risk.    Baseline 8/24: 61 seconds: 0.15 m/s 10/17: 64 seconds ~0.15 m/s 12/7: 0.67m/6m1.5s); 05/09/21: 0.13m/s71mTime 8    Period Weeks    Status Partially Met    Target Date 07/04/21       PT LONG TERM GOAL #7   Title Patient (< 60 yea75 old) will complete five times sit to stand test in < 20 seconds with SUE support  indicating an increased LE strength and improved balance.    Baseline 10/17: 19.23 seconds with BUE support 12/7: 20.21s with SUE on RW, SUE on chair; 05/09/21: 35 seconds    Time 8    Period Weeks    Status On-going    Target Date 07/04/21                   Plan - 05/14/21 1534     Clinical Impression Statement Pt pleasant and motivated during session. Gait training, endurance, balance and strength were addressed. Pt was limited by standing endurance tolerance; he required frequent and extended rest breaks between and within exercises. He lacks coordination of RLE hip flexion during swing phase of gait that leads to compensations with hip hike and circumduction. Patient will benefit from additional skilled intervention to improve strength and mobility to tolerance for improved household and community distance with improved quality of life.    Personal Factors and Comorbidities Comorbidity 3+;Education;Past/Current Experience;Time since onset of injury/illness/exacerbation;Transportation    Comorbidities HTN, arthritis, past CVA, high fall risk, obesity    Examination-Activity Limitations Bed Mobility;Locomotion Level;Squat;Stairs;Stand;Toileting;Transfers    Examination-Participation Restrictions Community Activity;Driving;Cleaning;Meal Prep;Shop;Volunteer;Yard Work    Stability/Clinical Decision Making Stable/Uncomplicated    Rehab Potential Good    PT Frequency 2x / week    PT Duration 8 weeks  PT Treatment/Interventions ADLs/Self Care Home Management;Cryotherapy;Moist Heat;Gait training;Stair training;Functional mobility training;Therapeutic activities;Therapeutic exercise;Balance training;Neuromuscular re-education;Patient/family education;Orthotic Fit/Training;Passive range of motion;Energy conservation    PT Next Visit Plan Continue with  progessive LE strengthening and gait/transfer training.    PT Home Exercise Plan no updates  this session; Reminder to patient to continue with daily walking    Consulted and Agree with Plan of Care Patient             Patient will benefit from skilled therapeutic intervention in order to improve the following deficits and impairments:  Abnormal gait, Decreased balance, Decreased endurance, Decreased mobility, Difficulty walking, Hypomobility, Obesity, Improper body mechanics, Decreased activity tolerance, Decreased strength, Postural dysfunction  Visit Diagnosis: Abnormality of gait and mobility  Difficulty in walking, not elsewhere classified  Muscle weakness (generalized)  Unsteadiness on feet  Other lack of coordination  Repeated falls  Other abnormalities of gait and mobility  Left-sided weakness  Hemiplegia and hemiparesis following cerebral infarction affecting right dominant side (HCC)  Acute CVA (cerebrovascular accident) [facility])     Problem List Patient Active Problem List   Diagnosis Date Noted   Impaired glucose tolerance 03/23/2020   Learning disability 03/23/2020   Urinary and fecal incontinence 06/14/2019   Chronic pain syndrome 03/23/2019   Constipation 03/23/2019   Dysarthria 03/23/2019   Dysphagia 03/23/2019   Learning difficulty 03/23/2019   Morbid obesity ([place]) 03/23/2019   Muscle weakness 03/23/2019   Vitamin D deficiency 03/23/2019   Bilateral carotid artery stenosis 12/15/2018   Moderate aortic valve stenosis 12/15/2018   BMI 45.0-49.9, adult ([place]) 11/10/2018   Cerebrovascular accident ([place]) 05/02/2016   Hemiparesis affecting right side as late effect of cerebrovascular accident ([place]) 05/01/2016   Brain tumor ([place]) 11/24/2014   Gastro-esophageal reflux disease without esophagitis 11/24/2014   Accident due to mechanical fall without injury 08/03/2014   Essential hypertension 06/13/2014   Mixed hyperlipidemia 06/13/2014   Unspecified  sequelae of cerebral infarction 06/13/2014   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 [place], [place], [address] Phone: [phone]   Fax:  [phone]  Name: RANEN DOOLIN MRN: [id number] Date of Birth: [birth year]

## 2021-05-16 ENCOUNTER — Ambulatory Visit: Payer: Medicare Other

## 2021-05-16 ENCOUNTER — Other Ambulatory Visit: Payer: Self-pay

## 2021-05-16 DIAGNOSIS — R269 Unspecified abnormalities of gait and mobility: Secondary | ICD-10-CM

## 2021-05-16 DIAGNOSIS — R262 Difficulty in walking, not elsewhere classified: Secondary | ICD-10-CM

## 2021-05-16 DIAGNOSIS — R2681 Unsteadiness on feet: Secondary | ICD-10-CM

## 2021-05-16 DIAGNOSIS — M6281 Muscle weakness (generalized): Secondary | ICD-10-CM

## 2021-05-16 NOTE — Therapy (Signed)
[place] MAIN [facility] SERVICES [address] [place], [place], [address] Phone: [phone]   Fax:  [phone]  Physical Therapy Treatment/RECERT  Patient Details  Name: Curtis Powell MRN: [id number] Date of Birth: 1965-05-30 Referring Provider (PT): Kym Groom MD   Encounter Date: 05/16/2021   PT End of Session - 05/16/21 1459     Visit Number 52    Number of Visits 21    Date for PT Re-Evaluation 07/11/21    Authorization Type [employer] Medicare; [place] Medicaid    PT Start Time 1345    PT Stop Time 1431    PT Time Calculation (min) 46 min    Equipment Utilized During Treatment Gait belt    Activity Tolerance Patient limited by fatigue;Patient tolerated treatment well    Behavior During Therapy WFL for tasks assessed/performed             Past Medical History:  Diagnosis Date   GERD (gastroesophageal reflux disease)    Hyperlipidemia    Hypertension    Obesity    Stroke [facility])     Past Surgical History:  Procedure Laterality Date   BRAIN SURGERY      There were no vitals filed for this visit.   Subjective Assessment - 05/16/21 1455     Subjective Patient presents with his aunt. Went to the restroom prior to session. Reports compliance with HEP.    Pertinent History Patient is a pleasant 56 year old male who presents to physical therapy for weakness and immobility secondary to CVA.  Had a stroke in Feb 2018. He was previously fully independent, but this stroke caused severe residual deficits, mainly on the right side. He reports difficulty walking and decreased mobility. His mom had to move in with him and now is his main caregiver.  Patient had outpatient PT from November 2018-March 2022. Patient reports since being discharged from outpatient therapy he has kept up with his HEP daily. He does report desire for walking better and for getting in and out of bed easier. He reports his mom is helping with some ADLs. He reports needing help with  rolling in bed. He reports his mom/aide assist with dressing. He has a home aide 3 days out of the week;    Limitations Standing;Walking    How long can you sit comfortably? NA    How long can you stand comfortably? <30 min, does minimal standing;    How long can you walk comfortably? depends on the day, fatigue affects his walking; He is walking every day, about 15-20 min;    Diagnostic tests None recent    Patient Stated Goals to walk better and be independent in bed mobility;    Currently in Pain? No/denies                  RECERT Goals performed two sessions prior on 05/09/21, please refer to this note for further details. Patient's progress is limited by not having his AFO yet, still awaiting new AFO. Will place on trial period and re-test goals once AFO arrives.   INTERVENTIONS:    Therapeutic Exercises:  ambulate across stable and unstable surface outside. Negotiating changing surfaces from grass to sidewalk, across brick with turns and obstacles in pathway without LOB.ambulate with incline and three rest breaks:  -Patient exhibits forward flexed posture and increased lateral trunk lean during swing phase due to gluteal weakness. He required VCs for improved posture and to increase hip flexion during swing  for better step length. Patient able to exhibit reciprocal gait pattern throughout walking with intermittent normal step length (beginning prior to onset of fatigue).    5x STS with mutliple cues for gluteal squeeze  Seated GTB row 15x Seated trunk extension 10x each LE  Seated GTB hamstring curl 10x each LE     Neuro Re-ed:    stading with walker in front of patient and table in front of walker:  -transfer cones from one side of the table to the other based on PT guidance x 5 minutes -reach for cone outside BOS and transfer to opposite UE and place on table x 8 each UE; 3 near LOB posterior -shadow boxing x 2 minutes; two near LOB posterior   Pt educated throughout  session about proper posture and technique with exercises. Improved exercise technique, movement at target joints, use of target muscles after min to mod verbal, visual, tactile cues.    Goals performed two sessions prior on 05/09/21, please refer to this note for further details. Patient's progress is limited by not having his AFO yet, still awaiting new AFO. Will place on trial period and re-test goals once AFO arrives. Patient is challenged with incline ambulation as he has decreased foot clearance without his AFO. He lacks coordination of RLE hip flexion during swing phase of gait that leads to compensations with hip hike and circumductionPatient is unsure of status of AFO at this time. Standing stabilization techniques are improving however patient does continue to have multiple posterior LOB.Patient will benefit from additional skilled intervention to improve strength and mobility to tolerance for improved household and community distance with improved quality of life.                  PT Education - 05/16/21 1458     Education Details exercise technique, body mechanics, upright posture    Person(s) Educated Patient    Methods Explanation;Demonstration;Tactile cues;Verbal cues    Comprehension Verbalized understanding;Returned demonstration;Verbal cues required;Tactile cues required              PT Short Term Goals - 05/16/21 1502       PT SHORT TERM GOAL #1   Title Patient will be adherent to HEP at least 3x a week to improve functional strength and balance for better safety at home.    Baseline 11/15/2020- Patient reports performing seated LE hip march, knee ext, sit to stand and walking daily and states no questions. Advised patient to practice static standing using his walker or kitchen counter several times per day without UE support.    Time 4    Period Weeks    Status Achieved    Target Date 11/01/20      PT SHORT TERM GOAL #2   Title Patient will be  independent in bed mobility including sit to supine utilizing bed rails as needed for increased mobility at home.    Baseline 6/29: requires min A 8/24: patient reports needing mod A 12/7: patient reports needing min<>mod A; 05/09/21 MOD A for BLE assist during sit>sup    Time 4    Period Weeks    Status On-going    Target Date 06/13/21               PT Long Term Goals - 05/16/21 1502       PT LONG TERM GOAL #1   Title Patient will improve FOTO score to > 50% to indicate improve functional mobility at home and in community.  Baseline 6/29: 42% 8/24: 40% 10/17: 40% 12/12: 46%; 05/09/21: 34%    Time 8    Period Weeks    Status On-going    Target Date 07/11/21      PT LONG TERM GOAL #2   Title Patient (< 70 years old) will complete five times sit to stand test in < 20 seconds indicating an increased LE strength and improved balance.    Baseline 6/29: 23.8 sec with 2HHA. 11/15/2020= 48 sec with BUE Support and CGA using gait belt. 8/24: 23 seconds with 2 HHA 10/17: 19.23 seconds with BUE support 12/7: 20.21s BUE; 05/09/21: 35 seconds with BUE support and support using gait belt    Time 8    Period Weeks    Status On-going    Target Date 07/11/21      PT LONG TERM GOAL #3   Title Patient will increase 10 meter walk test to >0.15 m/s (60 sec or less) as to improve gait speed for better home ambulation and to reduce fall risk.    Baseline 6/29: 0.08 m/s with RW. 11/15/2020= 0.093 m/s with RW 8/24: 0.15 m/s 12/7= 0.31ms with RW; 05/09/21: 0.017m w/ RW    Time 8    Period Weeks    Status Achieved    Target Date 07/11/21      PT LONG TERM GOAL #4   Title Patient will increase BLE gross strength to 4+/5 as to improve functional strength for independent gait, increased standing tolerance and increased ADL ability.    Baseline 6/29: see flowsheet. 8/24: hips grossly 4-/5 with hip extension 2+/5; L knee flexion 2+/5 knee extension 4/5; R knee 3+/5 10/17: see note 12/7: see note; 05/09/21: see  note    Time 8    Period Weeks    Status Partially Met    Target Date 07/11/21      PT LONG TERM GOAL #5   Title Patient will ambulate 250 ft with least assistive device and Supervision with no breaks to allow for increased mobility within home.    Baseline 11/15/2020-Ambulation distance = approx 110 feet using pediatric bariatric walker 8/24: 155 ft with bariatric RW 10/17: 170 ft with bariatric RW 12/7: [id number] bariatric RW, see note; 05/09/21: [id number] bariatric RW    Time 8    Period Weeks    Status Partially Met    Target Date 07/11/21      PT LONG TERM GOAL #6   Title Patient will increase 10 meter walk test to >0.5 m/s ( 30 seconds) as to improve gait speed for better home ambulation and to reduce fall risk.    Baseline 8/24: 61 seconds: 0.15 m/s 10/17: 64 seconds ~0.15 m/s 12/7: 0.75m/22m1.5s); 05/09/21: 0.61m/s34mTime 8    Period Weeks    Status Partially Met    Target Date 07/11/21      PT LONG TERM GOAL #7   Title Patient (< 60 yea61 old) will complete five times sit to stand test in < 20 seconds with SUE support  indicating an increased LE strength and improved balance.    Baseline 10/17: 19.23 seconds with BUE support 12/7: 20.21s with SUE on RW, SUE on chair; 05/09/21: 35 seconds    Time 8    Period Weeks    Status On-going    Target Date 07/04/21                   Plan - 05/16/21 1501  Clinical Impression Statement Goals performed two sessions prior on 05/09/21, please refer to this note for further details. Patient's progress is limited by not having his AFO yet, still awaiting new AFO. Will place on trial period and re-test goals once AFO arrives. Patient is challenged with incline ambulation as he has decreased foot clearance without his AFO. He lacks coordination of RLE hip flexion during swing phase of gait that leads to compensations with hip hike and circumductionPatient is unsure of status of AFO at this time. Standing stabilization techniques are  improving however patient does continue to have multiple posterior LOB.Patient will benefit from additional skilled intervention to improve strength and mobility to tolerance for improved household and community distance with improved quality of life.    Personal Factors and Comorbidities Comorbidity 3+;Education;Past/Current Experience;Time since onset of injury/illness/exacerbation;Transportation    Comorbidities HTN, arthritis, past CVA, high fall risk, obesity    Examination-Activity Limitations Bed Mobility;Locomotion Level;Squat;Stairs;Stand;Toileting;Transfers    Examination-Participation Restrictions Community Activity;Driving;Cleaning;Meal Prep;Shop;Volunteer;Yard Work    Stability/Clinical Decision Making Stable/Uncomplicated    Rehab Potential Good    PT Frequency 2x / week    PT Duration 8 weeks    PT Treatment/Interventions ADLs/Self Care Home Management;Cryotherapy;Moist Heat;Gait training;Stair training;Functional mobility training;Therapeutic activities;Therapeutic exercise;Balance training;Neuromuscular re-education;Patient/family education;Orthotic Fit/Training;Passive range of motion;Energy conservation    PT Next Visit Plan Continue with progessive LE strengthening and gait/transfer training.    PT Home Exercise Plan no updates  this session; Reminder to patient to continue with daily walking    Consulted and Agree with Plan of Care Patient             Patient will benefit from skilled therapeutic intervention in order to improve the following deficits and impairments:  Abnormal gait, Decreased balance, Decreased endurance, Decreased mobility, Difficulty walking, Hypomobility, Obesity, Improper body mechanics, Decreased activity tolerance, Decreased strength, Postural dysfunction  Visit Diagnosis: Abnormality of gait and mobility  Difficulty in walking, not elsewhere classified  Muscle weakness (generalized)  Unsteadiness on feet     Problem List Patient Active  Problem List   Diagnosis Date Noted   Impaired glucose tolerance 03/23/2020   Learning disability 03/23/2020   Urinary and fecal incontinence 06/14/2019   Chronic pain syndrome 03/23/2019   Constipation 03/23/2019   Dysarthria 03/23/2019   Dysphagia 03/23/2019   Learning difficulty 03/23/2019   Morbid obesity ([place]) 03/23/2019   Muscle weakness 03/23/2019   Vitamin D deficiency 03/23/2019   Bilateral carotid artery stenosis 12/15/2018   Moderate aortic valve stenosis 12/15/2018   BMI 45.0-49.9, adult ([place]) 11/10/2018   Cerebrovascular accident ([place]) 05/02/2016   Hemiparesis affecting right side as late effect of cerebrovascular accident ([place]) 05/01/2016   Brain tumor ([place]) 11/24/2014   Gastro-esophageal reflux disease without esophagitis 11/24/2014   Accident due to mechanical fall without injury 08/03/2014   Essential hypertension 06/13/2014   Mixed hyperlipidemia 06/13/2014   Unspecified sequelae of cerebral infarction 06/13/2014   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, [place], [address] Phone: [phone]   Fax:  [phone]  Name: Curtis Powell MRN: [id number] Date of Birth: 03-28-1966

## 2021-05-21 ENCOUNTER — Other Ambulatory Visit: Payer: Self-pay

## 2021-05-21 ENCOUNTER — Ambulatory Visit: Payer: Medicare Other

## 2021-05-21 DIAGNOSIS — R269 Unspecified abnormalities of gait and mobility: Secondary | ICD-10-CM | POA: Diagnosis not present

## 2021-05-21 DIAGNOSIS — R262 Difficulty in walking, not elsewhere classified: Secondary | ICD-10-CM

## 2021-05-21 DIAGNOSIS — M6281 Muscle weakness (generalized): Secondary | ICD-10-CM

## 2021-05-21 NOTE — Therapy (Signed)
[place] MAIN [facility] SERVICES [address] [place], [place], [address] Phone: [phone]   Fax:  [phone]  Physical Therapy Treatment  Patient Details  Name: Curtis Powell MRN: [id number] Date of Birth: [birth year] Referring Provider (PT): Kym Groom MD   Encounter Date: 05/21/2021   PT End of Session - 05/21/21 1331     Visit Number 37    Number of Visits 86    Date for PT Re-Evaluation 07/11/21    Authorization Type [employer] Medicare; [place] [employer]    PT Start Time 1345    PT Stop Time 1430    PT Time Calculation (min) 45 min    Equipment Utilized During Treatment Gait belt    Activity Tolerance Patient limited by fatigue;Patient tolerated treatment well    Behavior During Therapy WFL for tasks assessed/performed             Past Medical History:  Diagnosis Date   GERD (gastroesophageal reflux disease)    Hyperlipidemia    Hypertension    Obesity    Stroke [facility])     Past Surgical History:  Procedure Laterality Date   BRAIN SURGERY      There were no vitals filed for this visit.   Subjective Assessment - 05/21/21 1421     Subjective Patient presents with his sister. No falls or LOB since last session    Pertinent History Patient is a pleasant 56 year old male who presents to physical therapy for weakness and immobility secondary to CVA.  Had a stroke in Feb 2018. He was previously fully independent, but this stroke caused severe residual deficits, mainly on the right side. He reports difficulty walking and decreased mobility. His mom had to move in with him and now is his main caregiver.  Patient had outpatient PT from November 2018-March 2022. Patient reports since being discharged from outpatient therapy he has kept up with his HEP daily. He does report desire for walking better and for getting in and out of bed easier. He reports his mom is helping with some ADLs. He reports needing help with rolling in bed. He reports his  mom/aide assist with dressing. He has a home aide 3 days out of the week;    Limitations Standing;Walking    How long can you sit comfortably? NA    How long can you stand comfortably? <30 min, does minimal standing;    How long can you walk comfortably? depends on the day, fatigue affects his walking; He is walking every day, about 15-20 min;    Diagnostic tests None recent    Patient Stated Goals to walk better and be independent in bed mobility;    Currently in Pain? No/denies                INTERVENTIONS:    Therapeutic Exercises:  ambulate across stable and unstable surface outside. Negotiating changing surfaces from grass to sidewalk, across brick with turns and obstacles in pathway without LOB.ambulate with incline and three rest breaks:  -Patient exhibits forward flexed posture and increased lateral trunk lean during swing phase due to gluteal weakness. He required VCs for improved posture and to increase hip flexion during swing for better step length. Patient able to exhibit reciprocal gait pattern throughout walking with intermittent normal step length (beginning prior to onset of fatigue).     Seated GTB row 15x Seated trunk extension 10x each LE  Seated GTB hamstring curl 10x each LE  Seated GTB  alternating LAQ 12x each LE  Seated GTB alternating IR/ER 12x each LE   Seated soccer ball kicks x3 minutes for coordination, spatial awareness, and muscle recruitment   Neuro Re-ed:    stading with walker in front of patient and table in front of walker:  -marching 10x each LE  -shadow boxing x 2 minutes; two near LOB posterior -reach for SPT hand in varying plane ; x 2 trials    Pt educated throughout session about proper posture and technique with exercises. Improved exercise technique, movement at target joints, use of target muscles after min to mod verbal, visual, tactile cues.      New order for AFO send to physician due to confusion with patient. Discussed  plan with patient's mother. Patient has multiple episodes of Right ankle instability and inversion resulting in near LOB while ambulating. Coordination of muscle recruitment patterning is improving with repetition as can be seen with soccer ball kicks. Patient will benefit from additional skilled intervention to improve strength and mobility to tolerance for improved household and community distance with improved quality of life.                    PT Education - 05/21/21 1331     Education Details exercise technique, body mechanics    Person(s) Educated Patient    Methods Explanation;Demonstration;Tactile cues;Verbal cues    Comprehension Verbalized understanding;Verbal cues required;Returned demonstration;Tactile cues required              PT Short Term Goals - 05/16/21 1502       PT SHORT TERM GOAL #1   Title Patient will be adherent to HEP at least 3x a week to improve functional strength and balance for better safety at home.    Baseline 11/15/2020- Patient reports performing seated LE hip march, knee ext, sit to stand and walking daily and states no questions. Advised patient to practice static standing using his walker or kitchen counter several times per day without UE support.    Time 4    Period Weeks    Status Achieved    Target Date 11/01/20      PT SHORT TERM GOAL #2   Title Patient will be independent in bed mobility including sit to supine utilizing bed rails as needed for increased mobility at home.    Baseline 6/29: requires min A 8/24: patient reports needing mod A 12/7: patient reports needing min<>mod A; 05/09/21 MOD A for BLE assist during sit>sup    Time 4    Period Weeks    Status On-going    Target Date 06/13/21               PT Long Term Goals - 05/16/21 1502       PT LONG TERM GOAL #1   Title Patient will improve FOTO score to > 50% to indicate improve functional mobility at home and in community.    Baseline 6/29: 42% 8/24: 40%  10/17: 40% 12/12: 46%; 05/09/21: 34%    Time 8    Period Weeks    Status On-going    Target Date 07/11/21      PT LONG TERM GOAL #2   Title Patient (< 69 years old) will complete five times sit to stand test in < 20 seconds indicating an increased LE strength and improved balance.    Baseline 6/29: 23.8 sec with 2HHA. 11/15/2020= 48 sec with BUE Support and CGA using gait belt. 8/24: 23 seconds with  2 HHA 10/17: 19.23 seconds with BUE support 12/7: 20.21s BUE; 05/09/21: 35 seconds with BUE support and support using gait belt    Time 8    Period Weeks    Status On-going    Target Date 07/11/21      PT LONG TERM GOAL #3   Title Patient will increase 10 meter walk test to >0.15 m/s (60 sec or less) as to improve gait speed for better home ambulation and to reduce fall risk.    Baseline 6/29: 0.08 m/s with RW. 11/15/2020= 0.093 m/s with RW 8/24: 0.15 m/s 12/7= 0.61ms with RW; 05/09/21: 0.03m w/ RW    Time 8    Period Weeks    Status Achieved    Target Date 07/11/21      PT LONG TERM GOAL #4   Title Patient will increase BLE gross strength to 4+/5 as to improve functional strength for independent gait, increased standing tolerance and increased ADL ability.    Baseline 6/29: see flowsheet. 8/24: hips grossly 4-/5 with hip extension 2+/5; L knee flexion 2+/5 knee extension 4/5; R knee 3+/5 10/17: see note 12/7: see note; 05/09/21: see note    Time 8    Period Weeks    Status Partially Met    Target Date 07/11/21      PT LONG TERM GOAL #5   Title Patient will ambulate 250 ft with least assistive device and Supervision with no breaks to allow for increased mobility within home.    Baseline 11/15/2020-Ambulation distance = approx 110 feet using pediatric bariatric walker 8/24: 155 ft with bariatric RW 10/17: 170 ft with bariatric RW 12/7: [id number] bariatric RW, see note; 05/09/21: [id number] bariatric RW    Time 8    Period Weeks    Status Partially Met    Target Date 07/11/21      PT LONG TERM  GOAL #6   Title Patient will increase 10 meter walk test to >0.5 m/s ( 30 seconds) as to improve gait speed for better home ambulation and to reduce fall risk.    Baseline 8/24: 61 seconds: 0.15 m/s 10/17: 64 seconds ~0.15 m/s 12/7: 0.62m/63m1.5s); 05/09/21: 0.76m/s71mTime 8    Period Weeks    Status Partially Met    Target Date 07/11/21      PT LONG TERM GOAL #7   Title Patient (< 60 yea16 old) will complete five times sit to stand test in < 20 seconds with SUE support  indicating an increased LE strength and improved balance.    Baseline 10/17: 19.23 seconds with BUE support 12/7: 20.21s with SUE on RW, SUE on chair; 05/09/21: 35 seconds    Time 8    Period Weeks    Status On-going    Target Date 07/04/21                   Plan - 05/21/21 1423     Clinical Impression Statement New order for AFO send to physician due to confusion with patient. Discussed plan with patient's mother. Patient has multiple episodes of Right ankle instability and inversion resulting in near LOB while ambulating. Coordination of muscle recruitment patterning is improving with repetition as can be seen with soccer ball kicks. Patient will benefit from additional skilled intervention to improve strength and mobility to tolerance for improved household and community distance with improved quality of life.    Personal Factors and Comorbidities Comorbidity 3+;Education;Past/Current Experience;Time since onset of  injury/illness/exacerbation;Transportation    Comorbidities HTN, arthritis, past CVA, high fall risk, obesity    Examination-Activity Limitations Bed Mobility;Locomotion Level;Squat;Stairs;Stand;Toileting;Transfers    Examination-Participation Restrictions Community Activity;Driving;Cleaning;Meal Prep;Shop;Volunteer;Yard Work    Stability/Clinical Decision Making Stable/Uncomplicated    Rehab Potential Good    PT Frequency 2x / week    PT Duration 8 weeks    PT Treatment/Interventions ADLs/Self Care  Home Management;Cryotherapy;Moist Heat;Gait training;Stair training;Functional mobility training;Therapeutic activities;Therapeutic exercise;Balance training;Neuromuscular re-education;Patient/family education;Orthotic Fit/Training;Passive range of motion;Energy conservation    PT Next Visit Plan Continue with progessive LE strengthening and gait/transfer training.    PT Home Exercise Plan no updates  this session; Reminder to patient to continue with daily walking    Consulted and Agree with Plan of Care Patient             Patient will benefit from skilled therapeutic intervention in order to improve the following deficits and impairments:  Abnormal gait, Decreased balance, Decreased endurance, Decreased mobility, Difficulty walking, Hypomobility, Obesity, Improper body mechanics, Decreased activity tolerance, Decreased strength, Postural dysfunction  Visit Diagnosis: Abnormality of gait and mobility  Difficulty in walking, not elsewhere classified  Muscle weakness (generalized)     Problem List Patient Active Problem List   Diagnosis Date Noted   Impaired glucose tolerance 03/23/2020   Learning disability 03/23/2020   Urinary and fecal incontinence 06/14/2019   Chronic pain syndrome 03/23/2019   Constipation 03/23/2019   Dysarthria 03/23/2019   Dysphagia 03/23/2019   Learning difficulty 03/23/2019   Morbid obesity ([place]) 03/23/2019   Muscle weakness 03/23/2019   Vitamin D deficiency 03/23/2019   Bilateral carotid artery stenosis 12/15/2018   Moderate aortic valve stenosis 12/15/2018   BMI 45.0-49.9, adult ([place]) 11/10/2018   Cerebrovascular accident ([place]) 05/02/2016   Hemiparesis affecting right side as late effect of cerebrovascular accident ([place]) 05/01/2016   Brain tumor ([place]) 11/24/2014   Gastro-esophageal reflux disease without esophagitis 11/24/2014   Accident due to mechanical fall without injury 08/03/2014   Essential hypertension 06/13/2014   Mixed  hyperlipidemia 06/13/2014   Unspecified sequelae of cerebral infarction 06/13/2014   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

## 2021-05-23 ENCOUNTER — Ambulatory Visit: Payer: Medicare Other

## 2021-05-23 ENCOUNTER — Other Ambulatory Visit: Payer: Self-pay

## 2021-05-23 DIAGNOSIS — R269 Unspecified abnormalities of gait and mobility: Secondary | ICD-10-CM | POA: Diagnosis not present

## 2021-05-23 DIAGNOSIS — M6281 Muscle weakness (generalized): Secondary | ICD-10-CM

## 2021-05-23 DIAGNOSIS — R2681 Unsteadiness on feet: Secondary | ICD-10-CM

## 2021-05-23 DIAGNOSIS — R262 Difficulty in walking, not elsewhere classified: Secondary | ICD-10-CM

## 2021-05-23 NOTE — Therapy (Signed)
[place] MAIN [facility] SERVICES [address] [place], [place], [address] Phone: [phone]   Fax:  [phone]  Physical Therapy Treatment  Patient Details  Name: Curtis Powell MRN: [id number] Date of Birth: 1965-07-22 Referring Provider (PT): Kym Groom MD   Encounter Date: 05/23/2021   PT End of Session - 05/23/21 1249     Visit Number 38    Number of Visits 15    Date for PT Re-Evaluation 07/11/21    Authorization Type UHC Medicare; [place] Medicaid    PT Start Time 1345    PT Stop Time 1429    PT Time Calculation (min) 44 min    Equipment Utilized During Treatment Gait belt    Activity Tolerance Patient limited by fatigue;Patient tolerated treatment well    Behavior During Therapy WFL for tasks assessed/performed             Past Medical History:  Diagnosis Date   GERD (gastroesophageal reflux disease)    Hyperlipidemia    Hypertension    Obesity    Stroke [facility])     Past Surgical History:  Procedure Laterality Date   BRAIN SURGERY      There were no vitals filed for this visit.   Subjective Assessment - 05/23/21 1302     Subjective Patient reports he has had no falls or LOB since last session. Has been compliant with HEP.    Pertinent History Patient is a pleasant 56 year old male who presents to physical therapy for weakness and immobility secondary to CVA.  Had a stroke in Feb 2018. He was previously fully independent, but this stroke caused severe residual deficits, mainly on the right side. He reports difficulty walking and decreased mobility. His mom had to move in with him and now is his main caregiver.  Patient had outpatient PT from November 2018-March 2022. Patient reports since being discharged from outpatient therapy he has kept up with his HEP daily. He does report desire for walking better and for getting in and out of bed easier. He reports his mom is helping with some ADLs. He reports needing help with rolling in  bed. He reports his mom/aide assist with dressing. He has a home aide 3 days out of the week;    Limitations Standing;Walking    How long can you sit comfortably? NA    How long can you stand comfortably? <30 min, does minimal standing;    How long can you walk comfortably? depends on the day, fatigue affects his walking; He is walking every day, about 15-20 min;    Diagnostic tests None recent    Patient Stated Goals to walk better and be independent in bed mobility;    Currently in Pain? No/denies                    INTERVENTIONS:    Therapeutic Exercises:  ambulate 140 ft with CGA, wheelchair follow.  -Patient exhibits forward flexed posture and increased lateral trunk lean during swing phase due to gluteal weakness. He required VCs for improved posture and to increase hip flexion during swing for better step length. Patient able to exhibit reciprocal gait pattern throughout walking with intermittent normal step length (beginning prior to onset of fatigue).     Sit to stand 5x    Seated GTB row 15x Seated trunk extension 10x each LE  Seated GTB hamstring curl 10x each LE      Neuro Re-ed:  stading with walker in front of patient and table in front of walker:  -marching 10x each LE  -shadow boxing x 2 minutes; two near LOB posterior -PVC pipe : chest press 10x, straight arm raise 10x;    Sitting upright passing ball back and forth with SPT for core activation, reaction response, and muscle recruitment patterning x 3 minutes   Toileting requires min A for STS and stabilization in standing position. Min A required for pulling pants up/down  Pt educated throughout session about proper posture and technique with exercises. Improved exercise technique, movement at target joints, use of target muscles after min to mod verbal, visual, tactile cues.    Patient is highly motivated throughout physical therapy session despite needing to take a restroom break midsession. In  the restroom he did requires assistance with transfers, mobility, and donning/doffing pants. Patient educated that he needs to use restroom prior to PT session in the future if possible to ensure full therapy session. Patient will benefit from additional skilled intervention to improve strength and mobility to tolerance for improved household and community distance with improved quality of life.                    PT Education - 05/23/21 1249     Education Details exercise technique, body mechanics    Person(s) Educated Patient    Methods Explanation;Demonstration;Tactile cues;Verbal cues    Comprehension Verbalized understanding;Returned demonstration;Verbal cues required;Tactile cues required              PT Short Term Goals - 05/16/21 1502       PT SHORT TERM GOAL #1   Title Patient will be adherent to HEP at least 3x a week to improve functional strength and balance for better safety at home.    Baseline 11/15/2020- Patient reports performing seated LE hip march, knee ext, sit to stand and walking daily and states no questions. Advised patient to practice static standing using his walker or kitchen counter several times per day without UE support.    Time 4    Period Weeks    Status Achieved    Target Date 11/01/20      PT SHORT TERM GOAL #2   Title Patient will be independent in bed mobility including sit to supine utilizing bed rails as needed for increased mobility at home.    Baseline 6/29: requires min A 8/24: patient reports needing mod A 12/7: patient reports needing min<>mod A; 05/09/21 MOD A for BLE assist during sit>sup    Time 4    Period Weeks    Status On-going    Target Date 06/13/21               PT Long Term Goals - 05/16/21 1502       PT LONG TERM GOAL #1   Title Patient will improve FOTO score to > 50% to indicate improve functional mobility at home and in community.    Baseline 6/29: 42% 8/24: 40% 10/17: 40% 12/12: 46%; 05/09/21: 34%     Time 8    Period Weeks    Status On-going    Target Date 07/11/21      PT LONG TERM GOAL #2   Title Patient (< 40 years old) will complete five times sit to stand test in < 20 seconds indicating an increased LE strength and improved balance.    Baseline 6/29: 23.8 sec with 2HHA. 11/15/2020= 48 sec with BUE Support and CGA using gait  belt. 8/24: 23 seconds with 2 HHA 10/17: 19.23 seconds with BUE support 12/7: 20.21s BUE; 05/09/21: 35 seconds with BUE support and support using gait belt    Time 8    Period Weeks    Status On-going    Target Date 07/11/21      PT LONG TERM GOAL #3   Title Patient will increase 10 meter walk test to >0.15 m/s (60 sec or less) as to improve gait speed for better home ambulation and to reduce fall risk.    Baseline 6/29: 0.08 m/s with RW. 11/15/2020= 0.093 m/s with RW 8/24: 0.15 m/s 12/7= 0.13ms with RW; 05/09/21: 0.048m w/ RW    Time 8    Period Weeks    Status Achieved    Target Date 07/11/21      PT LONG TERM GOAL #4   Title Patient will increase BLE gross strength to 4+/5 as to improve functional strength for independent gait, increased standing tolerance and increased ADL ability.    Baseline 6/29: see flowsheet. 8/24: hips grossly 4-/5 with hip extension 2+/5; L knee flexion 2+/5 knee extension 4/5; R knee 3+/5 10/17: see note 12/7: see note; 05/09/21: see note    Time 8    Period Weeks    Status Partially Met    Target Date 07/11/21      PT LONG TERM GOAL #5   Title Patient will ambulate 250 ft with least assistive device and Supervision with no breaks to allow for increased mobility within home.    Baseline 11/15/2020-Ambulation distance = approx 110 feet using pediatric bariatric walker 8/24: 155 ft with bariatric RW 10/17: 170 ft with bariatric RW 12/7: [id number] bariatric RW, see note; 05/09/21: [id number] bariatric RW    Time 8    Period Weeks    Status Partially Met    Target Date 07/11/21      PT LONG TERM GOAL #6   Title Patient will  increase 10 meter walk test to >0.5 m/s ( 30 seconds) as to improve gait speed for better home ambulation and to reduce fall risk.    Baseline 8/24: 61 seconds: 0.15 m/s 10/17: 64 seconds ~0.15 m/s 12/7: 0.51m/100m1.5s); 05/09/21: 0.33m/s72mTime 8    Period Weeks    Status Partially Met    Target Date 07/11/21      PT LONG TERM GOAL #7   Title Patient (< 60 yea93 old) will complete five times sit to stand test in < 20 seconds with SUE support  indicating an increased LE strength and improved balance.    Baseline 10/17: 19.23 seconds with BUE support 12/7: 20.21s with SUE on RW, SUE on chair; 05/09/21: 35 seconds    Time 8    Period Weeks    Status On-going    Target Date 07/04/21                   Plan - 05/23/21 1359     Clinical Impression Statement Patient is highly motivated throughout physical therapy session despite needing to take a restroom break midsession. In the restroom he did requires assistance with transfers, mobility, and donning/doffing pants. Patient educated that he needs to use restroom prior to PT session in the future if possible to ensure full therapy session. Patient will benefit from additional skilled intervention to improve strength and mobility to tolerance for improved household and community distance with improved quality of life.    Personal Factors and Comorbidities  Comorbidity 3+;Education;Past/Current Experience;Time since onset of injury/illness/exacerbation;Transportation    Comorbidities HTN, arthritis, past CVA, high fall risk, obesity    Examination-Activity Limitations Bed Mobility;Locomotion Level;Squat;Stairs;Stand;Toileting;Transfers    Examination-Participation Restrictions Community Activity;Driving;Cleaning;Meal Prep;Shop;Volunteer;Yard Work    Stability/Clinical Decision Making Stable/Uncomplicated    Rehab Potential Good    PT Frequency 2x / week    PT Duration 8 weeks    PT Treatment/Interventions ADLs/Self Care Home  Management;Cryotherapy;Moist Heat;Gait training;Stair training;Functional mobility training;Therapeutic activities;Therapeutic exercise;Balance training;Neuromuscular re-education;Patient/family education;Orthotic Fit/Training;Passive range of motion;Energy conservation    PT Next Visit Plan Continue with progessive LE strengthening and gait/transfer training.    PT Home Exercise Plan no updates  this session; Reminder to patient to continue with daily walking    Consulted and Agree with Plan of Care Patient             Patient will benefit from skilled therapeutic intervention in order to improve the following deficits and impairments:  Abnormal gait, Decreased balance, Decreased endurance, Decreased mobility, Difficulty walking, Hypomobility, Obesity, Improper body mechanics, Decreased activity tolerance, Decreased strength, Postural dysfunction  Visit Diagnosis: Abnormality of gait and mobility  Difficulty in walking, not elsewhere classified  Muscle weakness (generalized)  Unsteadiness on feet     Problem List Patient Active Problem List   Diagnosis Date Noted   Impaired glucose tolerance 03/23/2020   Learning disability 03/23/2020   Urinary and fecal incontinence 06/14/2019   Chronic pain syndrome 03/23/2019   Constipation 03/23/2019   Dysarthria 03/23/2019   Dysphagia 03/23/2019   Learning difficulty 03/23/2019   Morbid obesity ([place]) 03/23/2019   Muscle weakness 03/23/2019   Vitamin D deficiency 03/23/2019   Bilateral carotid artery stenosis 12/15/2018   Moderate aortic valve stenosis 12/15/2018   BMI 45.0-49.9, adult ([place]) 11/10/2018   Cerebrovascular accident ([place]) 05/02/2016   Hemiparesis affecting right side as late effect of cerebrovascular accident ([place]) 05/01/2016   Brain tumor ([place]) 11/24/2014   Gastro-esophageal reflux disease without esophagitis 11/24/2014   Accident due to mechanical fall without injury 08/03/2014   Essential hypertension 06/13/2014    Mixed hyperlipidemia 06/13/2014   Unspecified sequelae of cerebral infarction 06/13/2014   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 [place], [place], [address] Phone: [phone]   Fax:  [phone]  Name: Curtis Powell MRN: [id number] Date of Birth: 1965-08-10

## 2021-05-28 ENCOUNTER — Other Ambulatory Visit: Payer: Self-pay

## 2021-05-28 ENCOUNTER — Ambulatory Visit: Payer: Medicare Other

## 2021-05-28 DIAGNOSIS — R269 Unspecified abnormalities of gait and mobility: Secondary | ICD-10-CM

## 2021-05-28 DIAGNOSIS — R262 Difficulty in walking, not elsewhere classified: Secondary | ICD-10-CM

## 2021-05-28 DIAGNOSIS — M6281 Muscle weakness (generalized): Secondary | ICD-10-CM

## 2021-05-28 NOTE — Therapy (Signed)
[place] MAIN [facility] SERVICES [address] [place], [place], [address] Phone: [phone]   Fax:  [phone]  Physical Therapy Treatment  Patient Details  Name: Curtis Powell MRN: [id number] Date of Birth: 09-11-1965 Referring Provider (PT): Kym Groom MD   Encounter Date: 05/28/2021   PT End of Session - 05/28/21 1334     Visit Number 55    Number of Visits 32    Date for PT Re-Evaluation 07/11/21    Authorization Type UHC Medicare; [place] Medicaid    PT Start Time 1345    PT Stop Time 1429    PT Time Calculation (min) 44 min    Equipment Utilized During Treatment Gait belt    Activity Tolerance Patient limited by fatigue;Patient tolerated treatment well    Behavior During Therapy WFL for tasks assessed/performed             Past Medical History:  Diagnosis Date   GERD (gastroesophageal reflux disease)    Hyperlipidemia    Hypertension    Obesity    Stroke [facility])     Past Surgical History:  Procedure Laterality Date   BRAIN SURGERY      There were no vitals filed for this visit.   Subjective Assessment - 05/28/21 1350     Subjective Patient reports no falls or LOB since last session. Has been compliant with seated HEP.    Pertinent History Patient is a pleasant 56 year old male who presents to physical therapy for weakness and immobility secondary to CVA.  Had a stroke in Feb 2018. He was previously fully independent, but this stroke caused severe residual deficits, mainly on the right side. He reports difficulty walking and decreased mobility. His mom had to move in with him and now is his main caregiver.  Patient had outpatient PT from November 2018-March 2022. Patient reports since being discharged from outpatient therapy he has kept up with his HEP daily. He does report desire for walking better and for getting in and out of bed easier. He reports his mom is helping with some ADLs. He reports needing help with rolling in bed. He  reports his mom/aide assist with dressing. He has a home aide 3 days out of the week;    Limitations Standing;Walking    How long can you sit comfortably? NA    How long can you stand comfortably? <30 min, does minimal standing;    How long can you walk comfortably? depends on the day, fatigue affects his walking; He is walking every day, about 15-20 min;    Diagnostic tests None recent    Patient Stated Goals to walk better and be independent in bed mobility;    Currently in Pain? No/denies                     INTERVENTIONS:    Therapeutic Exercises:  ambulate 140 ft with CGA, wheelchair follow.  -Patient exhibits forward flexed posture and increased lateral trunk lean during swing phase due to gluteal weakness. He required VCs for improved posture and to increase hip flexion during swing for better step length. Patient able to exhibit reciprocal gait pattern throughout walking with intermittent normal step length (beginning prior to onset of fatigue).      Ambulate 10 ft to cone, turn and walk back to seat with bRW; patient became confused mid ambulation and needed cueing for re-orientation.    Seated GTB row 15x Seated trunk extension 10x  Seated GTB hamstring curl 10x each LE  Seated GTB adduction 15x each LE  Hamstring isometric pressing into dynadisc 10x each LE    Neuro Re-ed:    stading with walker in front of patient  -static stand 30 seconds no LOB  -marching 10x each LE  -shadow boxing x 2 minutes; two near LOB posterior  Pt educated throughout session about proper posture and technique with exercises. Improved exercise technique, movement at target joints, use of target muscles after min to mod verbal, visual, tactile cues.   Patient required occasional re-direct and re-orientation to task during session today. His vitals monitored throughout session to ensure therapeutic range. Turning and walking backwards are areas for continued progression at this  time. full therapy session. Patient will benefit from additional skilled intervention to improve strength and mobility to tolerance for improved household and community distance with improved quality of life.                PT Education - 05/28/21 1334     Education Details exercise technique, body mechanics    Person(s) Educated Patient    Methods Explanation;Demonstration;Tactile cues;Verbal cues    Comprehension Verbalized understanding;Returned demonstration;Verbal cues required;Tactile cues required              PT Short Term Goals - 05/16/21 1502       PT SHORT TERM GOAL #1   Title Patient will be adherent to HEP at least 3x a week to improve functional strength and balance for better safety at home.    Baseline 11/15/2020- Patient reports performing seated LE hip march, knee ext, sit to stand and walking daily and states no questions. Advised patient to practice static standing using his walker or kitchen counter several times per day without UE support.    Time 4    Period Weeks    Status Achieved    Target Date 11/01/20      PT SHORT TERM GOAL #2   Title Patient will be independent in bed mobility including sit to supine utilizing bed rails as needed for increased mobility at home.    Baseline 6/29: requires min A 8/24: patient reports needing mod A 12/7: patient reports needing min<>mod A; 05/09/21 MOD A for BLE assist during sit>sup    Time 4    Period Weeks    Status On-going    Target Date 06/13/21               PT Long Term Goals - 05/16/21 1502       PT LONG TERM GOAL #1   Title Patient will improve FOTO score to > 50% to indicate improve functional mobility at home and in community.    Baseline 6/29: 42% 8/24: 40% 10/17: 40% 12/12: 46%; 05/09/21: 34%    Time 8    Period Weeks    Status On-going    Target Date 07/11/21      PT LONG TERM GOAL #2   Title Patient (< 41 years old) will complete five times sit to stand test in < 20 seconds  indicating an increased LE strength and improved balance.    Baseline 6/29: 23.8 sec with 2HHA. 11/15/2020= 48 sec with BUE Support and CGA using gait belt. 8/24: 23 seconds with 2 HHA 10/17: 19.23 seconds with BUE support 12/7: 20.21s BUE; 05/09/21: 35 seconds with BUE support and support using gait belt    Time 8    Period Weeks    Status On-going  Target Date 07/11/21      PT LONG TERM GOAL #3   Title Patient will increase 10 meter walk test to >0.15 m/s (60 sec or less) as to improve gait speed for better home ambulation and to reduce fall risk.    Baseline 6/29: 0.08 m/s with RW. 11/15/2020= 0.093 m/s with RW 8/24: 0.15 m/s 12/7= 0.35ms with RW; 05/09/21: 0.025m w/ RW    Time 8    Period Weeks    Status Achieved    Target Date 07/11/21      PT LONG TERM GOAL #4   Title Patient will increase BLE gross strength to 4+/5 as to improve functional strength for independent gait, increased standing tolerance and increased ADL ability.    Baseline 6/29: see flowsheet. 8/24: hips grossly 4-/5 with hip extension 2+/5; L knee flexion 2+/5 knee extension 4/5; R knee 3+/5 10/17: see note 12/7: see note; 05/09/21: see note    Time 8    Period Weeks    Status Partially Met    Target Date 07/11/21      PT LONG TERM GOAL #5   Title Patient will ambulate 250 ft with least assistive device and Supervision with no breaks to allow for increased mobility within home.    Baseline 11/15/2020-Ambulation distance = approx 110 feet using pediatric bariatric walker 8/24: 155 ft with bariatric RW 10/17: 170 ft with bariatric RW 12/7: [id number] bariatric RW, see note; 05/09/21: [id number] bariatric RW    Time 8    Period Weeks    Status Partially Met    Target Date 07/11/21      PT LONG TERM GOAL #6   Title Patient will increase 10 meter walk test to >0.5 m/s ( 30 seconds) as to improve gait speed for better home ambulation and to reduce fall risk.    Baseline 8/24: 61 seconds: 0.15 m/s 10/17: 64 seconds ~0.15 m/s  12/7: 0.37m/58m1.5s); 05/09/21: 0.31m/s66mTime 8    Period Weeks    Status Partially Met    Target Date 07/11/21      PT LONG TERM GOAL #7   Title Patient (< 60 yea57 old) will complete five times sit to stand test in < 20 seconds with SUE support  indicating an increased LE strength and improved balance.    Baseline 10/17: 19.23 seconds with BUE support 12/7: 20.21s with SUE on RW, SUE on chair; 05/09/21: 35 seconds    Time 8    Period Weeks    Status On-going    Target Date 07/04/21                   Plan - 05/28/21 1414     Clinical Impression Statement Patient required occasional re-direct and re-orientation to task during session today. His vitals monitored throughout session to ensure therapeutic range. Turning and walking backwards are areas for continued progression at this time. full therapy session. Patient will benefit from additional skilled intervention to improve strength and mobility to tolerance for improved household and community distance with improved quality of life.    Personal Factors and Comorbidities Comorbidity 3+;Education;Past/Current Experience;Time since onset of injury/illness/exacerbation;Transportation    Comorbidities HTN, arthritis, past CVA, high fall risk, obesity    Examination-Activity Limitations Bed Mobility;Locomotion Level;Squat;Stairs;Stand;Toileting;Transfers    Examination-Participation Restrictions Community Activity;Driving;Cleaning;Meal Prep;Shop;Volunteer;Yard Work    Stability/Clinical Decision Making Stable/Uncomplicated    Rehab Potential Good    PT Frequency 2x / week    PT  Duration 8 weeks    PT Treatment/Interventions ADLs/Self Care Home Management;Cryotherapy;Moist Heat;Gait training;Stair training;Functional mobility training;Therapeutic activities;Therapeutic exercise;Balance training;Neuromuscular re-education;Patient/family education;Orthotic Fit/Training;Passive range of motion;Energy conservation    PT Next Visit Plan  Continue with progessive LE strengthening and gait/transfer training.    PT Home Exercise Plan no updates  this session; Reminder to patient to continue with daily walking    Consulted and Agree with Plan of Care Patient             Patient will benefit from skilled therapeutic intervention in order to improve the following deficits and impairments:  Abnormal gait, Decreased balance, Decreased endurance, Decreased mobility, Difficulty walking, Hypomobility, Obesity, Improper body mechanics, Decreased activity tolerance, Decreased strength, Postural dysfunction  Visit Diagnosis: Abnormality of gait and mobility  Difficulty in walking, not elsewhere classified  Muscle weakness (generalized)     Problem List Patient Active Problem List   Diagnosis Date Noted   Impaired glucose tolerance 03/23/2020   Learning disability 03/23/2020   Urinary and fecal incontinence 06/14/2019   Chronic pain syndrome 03/23/2019   Constipation 03/23/2019   Dysarthria 03/23/2019   Dysphagia 03/23/2019   Learning difficulty 03/23/2019   Morbid obesity ([place]) 03/23/2019   Muscle weakness 03/23/2019   Vitamin D deficiency 03/23/2019   Bilateral carotid artery stenosis 12/15/2018   Moderate aortic valve stenosis 12/15/2018   BMI 45.0-49.9, adult ([place]) 11/10/2018   Cerebrovascular accident ([place]) 05/02/2016   Hemiparesis affecting right side as late effect of cerebrovascular accident ([place]) 05/01/2016   Brain tumor ([place]) 11/24/2014   Gastro-esophageal reflux disease without esophagitis 11/24/2014   Accident due to mechanical fall without injury 08/03/2014   Essential hypertension 06/13/2014   Mixed hyperlipidemia 06/13/2014   Unspecified sequelae of cerebral infarction 06/13/2014   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 [place], [place], [address] Phone: [phone]   Fax:  [phone]  Name: Curtis Powell MRN: [id number] Date of Birth: April 24, 1965

## 2021-05-30 ENCOUNTER — Other Ambulatory Visit: Payer: Self-pay

## 2021-05-30 ENCOUNTER — Ambulatory Visit: Payer: Medicare Other

## 2021-05-30 ENCOUNTER — Ambulatory Visit: Payer: Medicare Other | Admitting: Physical Therapy

## 2021-05-30 ENCOUNTER — Encounter: Payer: Self-pay | Admitting: Physical Therapy

## 2021-05-30 DIAGNOSIS — R296 Repeated falls: Secondary | ICD-10-CM

## 2021-05-30 DIAGNOSIS — M6281 Muscle weakness (generalized): Secondary | ICD-10-CM

## 2021-05-30 DIAGNOSIS — R262 Difficulty in walking, not elsewhere classified: Secondary | ICD-10-CM

## 2021-05-30 DIAGNOSIS — R269 Unspecified abnormalities of gait and mobility: Secondary | ICD-10-CM

## 2021-05-30 DIAGNOSIS — R278 Other lack of coordination: Secondary | ICD-10-CM

## 2021-05-30 DIAGNOSIS — R2689 Other abnormalities of gait and mobility: Secondary | ICD-10-CM

## 2021-05-30 DIAGNOSIS — R2681 Unsteadiness on feet: Secondary | ICD-10-CM

## 2021-05-30 NOTE — Patient Instructions (Signed)
Access Code: [email or web address] URL: [email or web address] Date: 05/30/2021 Prepared by: Blanche East  Exercises Sit to Stand with Counter Support - 1 x daily - 7 x weekly - 2 sets - 5 reps Standing Hip Abduction with Counter Support - 1 x daily - 7 x weekly - 1 sets - 10 reps Standing Hamstring Curl with Chair Support - 1 x daily - 7 x weekly - 1 sets - 10 reps

## 2021-05-30 NOTE — Therapy (Signed)
[place] °[facility] °[address] °[place], [place], [address] °Phone: [phone]   Fax:  [phone] ° °Physical Therapy Treatment ° °Patient Details  °Name: Curtis Powell °MRN: [id number] °Date of Birth: 12/18/1965 °Referring Provider (PT): Olmedo MD ° ° °Encounter Date: 05/30/2021 ° ° PT End of Session - 05/30/21 1259   ° ° Visit Number [id number]   ° Number of Visits 68   ° Date for PT Re-Evaluation 07/11/21   ° Authorization Type [employer] Medicare; [place] Medicaid   ° PT Start Time 1302   ° PT Stop Time 1345   ° PT Time Calculation (min) 43 min   ° Equipment Utilized During Treatment Gait belt   ° Activity Tolerance Patient limited by fatigue;Patient tolerated treatment well   ° Behavior During Therapy WFL for tasks assessed/performed   ° °  °  ° °  ° ° °Past Medical History:  °Diagnosis Date  ° GERD (gastroesophageal reflux disease)   ° Hyperlipidemia   ° Hypertension   ° Obesity   ° Stroke (HCC)   ° ° °Past Surgical History:  °Procedure Laterality Date  ° BRAIN SURGERY    ° ° °There were no vitals filed for this visit. ° ° Subjective Assessment - 05/30/21 1305   ° ° Subjective Patient reports doing okay; He denies any new falls. Reports working on HEP mostly doing seated march, hip abduction and leg kicks.   ° Pertinent History Patient is a pleasant 56 year old male who presents to physical therapy for weakness and immobility secondary to CVA.  Had a stroke in Feb 2018. He was previously fully independent, but this stroke caused severe residual deficits, mainly on the right side. He reports difficulty walking and decreased mobility. His mom had to move in with him and now is his main caregiver.  Patient had outpatient PT from November 2018-March 2022. Patient reports since being discharged from outpatient therapy he has kept up with his HEP daily. He does report desire for walking better and for getting in and out of bed easier. He reports his mom is helping with some ADLs. He  reports needing help with rolling in bed. He reports his mom/aide assist with dressing. He has a home aide 3 days out of the week;   ° Limitations Standing;Walking   ° How long can you sit comfortably? NA   ° How long can you stand comfortably? <30 min, does minimal standing;   ° How long can you walk comfortably? depends on the day, fatigue affects his walking; He is walking every day, about 15-20 min;   ° Diagnostic tests None recent   ° Patient Stated Goals to walk better and be independent in bed mobility;   ° Currently in Pain? No/denies   ° °  °  ° °  ° ° ° ° ° ° ° ° °INTERVENTIONS:  °  °Therapeutic Exercises:  °Advanced HEP: °Instructed patient in sit<>Stand with RW x5 reps with cues for weight shift and hand placement; Recommend patient utilize sink/counter at home for better safety;  ° °Standing with RW: °-hip abduction 2x10 reps each LE, supervision °-hamstring curl x10 reps each LE supervision °Patient able to complete with RW with supervision from PT with cues for erect posture with better hip extension; Educated pt on ways to improve safety at home with using counter and to have caregiver with supervision; ° °NMR °ambulate 60 ft with CGA, wheelchair follow. with 2 big turns and   2 small turns °-Patient exhibits forward flexed posture and increased lateral trunk lean during swing phase due to gluteal weakness. He required VCs for improved posture and to increase hip flexion during swing for better step length. Patient able to exhibit reciprocal gait pattern throughout walking with intermittent normal step length (beginning prior to onset of fatigue).  °  °   °Pt educated throughout session about proper posture and technique with exercises. Improved exercise technique, movement at target joints, use of target muscles after min to mod verbal, visual, tactile cues. °  ° Provided written HEP for better adherence- see patient instructions;  ° °Patient reports working on seated HEP but has not been doing  standing exercise. He does walk short distances when his aide helps. Patient instructed in advanced standing exercise, utilizing RW for safety. Caregiver/aide educated as well to provide supervision to improve safety awareness. Patient expressed fear of falling with knees buckling but agreed to do exercise with supervision. He does require cues for erect posture to challenge trunk control. Often with prolonged standing/walking patient exhibits heavy UE lean with flexed posture. He also exhibits increased difficulty turning with RW requiring cues for foot positioning/sequencing and to improve erect posture. He would benefit from additional skilled PT Intervention to improve strength, balance and mobility;  °  °  ° ° ° ° ° ° ° ° ° ° ° ° ° ° ° ° ° ° ° ° PT Education - 05/30/21 1306   ° ° Education Details exercise technique/positioning;   ° Person(s) Educated Patient   ° Methods Explanation;Verbal cues   ° Comprehension Verbalized understanding;Returned demonstration;Verbal cues required;Need further instruction   ° °  °  ° °  ° ° ° PT Short Term Goals - 05/16/21 1502   ° °  ° PT SHORT TERM GOAL #1  ° Title Patient will be adherent to HEP at least 3x a week to improve functional strength and balance for better safety at home.   ° Baseline 11/15/2020- Patient reports performing seated LE hip march, knee ext, sit to stand and walking daily and states no questions. Advised patient to practice static standing using his walker or kitchen counter several times per day without UE support.   ° Time 4   ° Period Weeks   ° Status Achieved   ° Target Date 11/01/20   °  ° PT SHORT TERM GOAL #2  ° Title Patient will be independent in bed mobility including sit to supine utilizing bed rails as needed for increased mobility at home.   ° Baseline 6/29: requires min A 8/24: patient reports needing mod A 12/7: patient reports needing min<>mod A; 05/09/21 MOD A for BLE assist during sit>sup   ° Time 4   ° Period Weeks   ° Status On-going    ° Target Date 06/13/21   ° °  °  ° °  ° ° ° ° PT Long Term Goals - 05/16/21 1502   ° °  ° PT LONG TERM GOAL #1  ° Title Patient will improve FOTO score to > 50% to indicate improve functional mobility at home and in community.   ° Baseline 6/29: 42% 8/24: 40% 10/17: 40% 12/12: 46%; 05/09/21: 34%   ° Time 8   ° Period Weeks   ° Status On-going   ° Target Date 07/11/21   °  ° PT LONG TERM GOAL #2  ° Title Patient (< 60 years old) will complete five times sit to stand test   in < 20 seconds indicating an increased LE strength and improved balance.   ° Baseline 6/29: 23.8 sec with 2HHA. 11/15/2020= 48 sec with BUE Support and CGA using gait belt. 8/24: 23 seconds with 2 HHA 10/17: 19.23 seconds with BUE support 12/7: 20.21s BUE; 05/09/21: 35 seconds with BUE support and support using gait belt   ° Time 8   ° Period Weeks   ° Status On-going   ° Target Date 07/11/21   °  ° PT LONG TERM GOAL #3  ° Title Patient will increase 10 meter walk test to >0.15 m/s (60 sec or less) as to improve gait speed for better home ambulation and to reduce fall risk.   ° Baseline 6/29: 0.08 m/s with RW. 11/15/2020= 0.093 m/s with RW 8/24: 0.15 m/s 12/7= 0.19m/s with RW; 05/09/21: 0.07m/s w/ RW   ° Time 8   ° Period Weeks   ° Status Achieved   ° Target Date 07/11/21   °  ° PT LONG TERM GOAL #4  ° Title Patient will increase BLE gross strength to 4+/5 as to improve functional strength for independent gait, increased standing tolerance and increased ADL ability.   ° Baseline 6/29: see flowsheet. 8/24: hips grossly 4-/5 with hip extension 2+/5; L knee flexion 2+/5 knee extension 4/5; R knee 3+/5 10/17: see note 12/7: see note; 05/09/21: see note   ° Time 8   ° Period Weeks   ° Status Partially Met   ° Target Date 07/11/21   °  ° PT LONG TERM GOAL #5  ° Title Patient will ambulate 250 ft with least assistive device and Supervision with no breaks to allow for increased mobility within home.   ° Baseline 11/15/2020-Ambulation distance = approx 110 feet using  pediatric bariatric walker 8/24: 155 ft with bariatric RW 10/17: 170 ft with bariatric RW 12/7: 170ft with bariatric RW, see note; 05/09/21: 83ft with bariatric RW   ° Time 8   ° Period Weeks   ° Status Partially Met   ° Target Date 07/11/21   °  ° PT LONG TERM GOAL #6  ° Title Patient will increase 10 meter walk test to >0.5 m/s ( 30 seconds) as to improve gait speed for better home ambulation and to reduce fall risk.   ° Baseline 8/24: 61 seconds: 0.15 m/s 10/17: 64 seconds ~0.15 m/s 12/7: 0.19m/s (51.5s); 05/09/21: 0.07m/s   ° Time 8   ° Period Weeks   ° Status Partially Met   ° Target Date 07/11/21   °  ° PT LONG TERM GOAL #7  ° Title Patient (< 60 years old) will complete five times sit to stand test in < 20 seconds with SUE support  indicating an increased LE strength and improved balance.   ° Baseline 10/17: 19.23 seconds with BUE support 12/7: 20.21s with SUE on RW, SUE on chair; 05/09/21: 35 seconds   ° Time 8   ° Period Weeks   ° Status On-going   ° Target Date 07/04/21   ° °  °  ° °  ° ° ° ° ° ° ° ° Plan - 05/30/21 1540   ° ° Clinical Impression Statement Patient reports working on seated HEP but has not been doing standing exercise. He does walk short distances when his aide helps. Patient instructed in advanced standing exercise, utilizing RW for safety. Caregiver/aide educated as well to provide supervision to improve safety awareness. Patient expressed fear of falling with knees buckling but   agreed to do exercise with supervision. He does require cues for erect posture to challenge trunk control. Often with prolonged standing/walking patient exhibits heavy UE lean with flexed posture. He also exhibits increased difficulty turning with RW requiring cues for foot positioning/sequencing and to improve erect posture. He would benefit from additional skilled PT Intervention to improve strength, balance and mobility;    Personal Factors and Comorbidities Comorbidity 3+;Education;Past/Current Experience;Time  since onset of injury/illness/exacerbation;Transportation    Comorbidities HTN, arthritis, past CVA, high fall risk, obesity    Examination-Activity Limitations Bed Mobility;Locomotion Level;Squat;Stairs;Stand;Toileting;Transfers    Examination-Participation Restrictions Community Activity;Driving;Cleaning;Meal Prep;Shop;Volunteer;Yard Work    Stability/Clinical Decision Making Stable/Uncomplicated    Rehab Potential Good    PT Frequency 2x / week    PT Duration 8 weeks    PT Treatment/Interventions ADLs/Self Care Home Management;Cryotherapy;Moist Heat;Gait training;Stair training;Functional mobility training;Therapeutic activities;Therapeutic exercise;Balance training;Neuromuscular re-education;Patient/family education;Orthotic Fit/Training;Passive range of motion;Energy conservation    PT Next Visit Plan Continue with progessive LE strengthening and gait/transfer training.    PT Home Exercise Plan no updates  this session; Reminder to patient to continue with daily walking    Consulted and Agree with Plan of Care Patient             Patient will benefit from skilled therapeutic intervention in order to improve the following deficits and impairments:  Abnormal gait, Decreased balance, Decreased endurance, Decreased mobility, Difficulty walking, Hypomobility, Obesity, Improper body mechanics, Decreased activity tolerance, Decreased strength, Postural dysfunction  Visit Diagnosis: Abnormality of gait and mobility  Difficulty in walking, not elsewhere classified  Muscle weakness (generalized)  Unsteadiness on feet  Other lack of coordination  Repeated falls  Other abnormalities of gait and mobility     Problem List Patient Active Problem List   Diagnosis Date Noted   Impaired glucose tolerance 03/23/2020   Learning disability 03/23/2020   Urinary and fecal incontinence 06/14/2019   Chronic pain syndrome 03/23/2019   Constipation 03/23/2019   Dysarthria 03/23/2019    Dysphagia 03/23/2019   Learning difficulty 03/23/2019   Morbid obesity ([place]) 03/23/2019   Muscle weakness 03/23/2019   Vitamin D deficiency 03/23/2019   Bilateral carotid artery stenosis 12/15/2018   Moderate aortic valve stenosis 12/15/2018   BMI 45.0-49.9, adult ([place]) 11/10/2018   Cerebrovascular accident ([place]) 05/02/2016   Hemiparesis affecting right side as late effect of cerebrovascular accident ([place]) 05/01/2016   Brain tumor ([place]) 11/24/2014   Gastro-esophageal reflux disease without esophagitis 11/24/2014   Accident due to mechanical fall without injury 08/03/2014   Essential hypertension 06/13/2014   Mixed hyperlipidemia 06/13/2014   Unspecified sequelae of cerebral infarction 06/13/2014   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 [place], [place], [address] Phone: [phone]   Fax:  [phone]  Name: NYKEEM CITRO MRN: [id number] Date of Birth: 1966-03-04

## 2021-06-04 ENCOUNTER — Other Ambulatory Visit: Payer: Self-pay

## 2021-06-04 ENCOUNTER — Ambulatory Visit: Payer: Medicare Other

## 2021-06-04 DIAGNOSIS — M6281 Muscle weakness (generalized): Secondary | ICD-10-CM

## 2021-06-04 DIAGNOSIS — R269 Unspecified abnormalities of gait and mobility: Secondary | ICD-10-CM

## 2021-06-04 DIAGNOSIS — R262 Difficulty in walking, not elsewhere classified: Secondary | ICD-10-CM

## 2021-06-04 DIAGNOSIS — R2681 Unsteadiness on feet: Secondary | ICD-10-CM

## 2021-06-04 NOTE — Therapy (Signed)
[place] MAIN [facility] SERVICES [address] [place], [place], [address] Phone: [phone]   Fax:  [phone]  Physical Therapy Treatment  Patient Details  Name: Curtis Powell MRN: [id number] Date of Birth: 1965-08-30 Referring Provider (PT): Kym Groom MD   Encounter Date: 06/04/2021   PT End of Session - 06/04/21 1345     Visit Number 84    Number of Visits 40    Date for PT Re-Evaluation 07/11/21    Authorization Type UHC Medicare; [place] Medicaid    PT Start Time 1345    PT Stop Time 1429    PT Time Calculation (min) 44 min    Equipment Utilized During Treatment Gait belt    Activity Tolerance Patient limited by fatigue;Patient tolerated treatment well    Behavior During Therapy WFL for tasks assessed/performed             Past Medical History:  Diagnosis Date   GERD (gastroesophageal reflux disease)    Hyperlipidemia    Hypertension    Obesity    Stroke [facility])     Past Surgical History:  Procedure Laterality Date   BRAIN SURGERY      There were no vitals filed for this visit.   Subjective Assessment - 06/04/21 1535     Subjective Patient presents with his sister. She reports they have done his new standing HEP consistantly. No falls or LOB since last session.    Pertinent History Patient is a pleasant 56 year old male who presents to physical therapy for weakness and immobility secondary to CVA.  Had a stroke in Feb 2018. He was previously fully independent, but this stroke caused severe residual deficits, mainly on the right side. He reports difficulty walking and decreased mobility. His mom had to move in with him and now is his main caregiver.  Patient had outpatient PT from November 2018-March 2022. Patient reports since being discharged from outpatient therapy he has kept up with his HEP daily. He does report desire for walking better and for getting in and out of bed easier. He reports his mom is helping with some ADLs. He  reports needing help with rolling in bed. He reports his mom/aide assist with dressing. He has a home aide 3 days out of the week;    Limitations Standing;Walking    How long can you sit comfortably? NA    How long can you stand comfortably? <30 min, does minimal standing;    How long can you walk comfortably? depends on the day, fatigue affects his walking; He is walking every day, about 15-20 min;    Diagnostic tests None recent    Patient Stated Goals to walk better and be independent in bed mobility;    Currently in Pain? No/denies                  Therapeutic Exercises:   Standing in // bars: Forward/backwards ambulation 4x length of // bars Lateral stepping 4x length of // bars Weave between two cones with bRW and close CGA; cues for keeping feet within walker 2 trials; very fatiguing to patient Sit to stand 5x with cues for gluteal squeeze at top GTB hamstring curl 15x each LE (added to HEP) GTB adduction 15x each LE      Neuro Re-ed:    stading with walker in front of patient and table in front of walker:  -transfer cones from one side of the table to the other based on  PT guidance x 5 minutes -reach for cone outside BOS and transfer to opposite UE and place on table x 8 each UE; 3 near LOB posterior -shadow boxing x 2 minutes; two near LOB posterior   Pt educated throughout session about proper posture and technique with exercises. Improved exercise technique, movement at target joints, use of target muscles after min to mod verbal, visual, tactile cues.    Patient tolerated progressive stability and mobility interventions with decreased episodic LOB. He is challenged with turning which will continue to be an area of focus in future sessions. Cueing is required to keep feet within walker and keep walker in a safe range. Patient does have knee hyperextension with fatigue in addition to increased trunk flexion. He would benefit from additional skilled PT Intervention  to improve strength, balance and mobility                    PT Education - 06/04/21 1344     Education Details exercise technique, body mechanics    Person(s) Educated Patient    Methods Explanation;Tactile cues;Demonstration;Verbal cues    Comprehension Verbalized understanding;Returned demonstration;Verbal cues required;Tactile cues required              PT Short Term Goals - 05/16/21 1502       PT SHORT TERM GOAL #1   Title Patient will be adherent to HEP at least 3x a week to improve functional strength and balance for better safety at home.    Baseline 11/15/2020- Patient reports performing seated LE hip march, knee ext, sit to stand and walking daily and states no questions. Advised patient to practice static standing using his walker or kitchen counter several times per day without UE support.    Time 4    Period Weeks    Status Achieved    Target Date 11/01/20      PT SHORT TERM GOAL #2   Title Patient will be independent in bed mobility including sit to supine utilizing bed rails as needed for increased mobility at home.    Baseline 6/29: requires min A 8/24: patient reports needing mod A 12/7: patient reports needing min<>mod A; 05/09/21 MOD A for BLE assist during sit>sup    Time 4    Period Weeks    Status On-going    Target Date 06/13/21               PT Long Term Goals - 05/16/21 1502       PT LONG TERM GOAL #1   Title Patient will improve FOTO score to > 50% to indicate improve functional mobility at home and in community.    Baseline 6/29: 42% 8/24: 40% 10/17: 40% 12/12: 46%; 05/09/21: 34%    Time 8    Period Weeks    Status On-going    Target Date 07/11/21      PT LONG TERM GOAL #2   Title Patient (< 79 years old) will complete five times sit to stand test in < 20 seconds indicating an increased LE strength and improved balance.    Baseline 6/29: 23.8 sec with 2HHA. 11/15/2020= 48 sec with BUE Support and CGA using gait belt. 8/24: 23  seconds with 2 HHA 10/17: 19.23 seconds with BUE support 12/7: 20.21s BUE; 05/09/21: 35 seconds with BUE support and support using gait belt    Time 8    Period Weeks    Status On-going    Target Date 07/11/21  PT LONG TERM GOAL #3   Title Patient will increase 10 meter walk test to >0.15 m/s (60 sec or less) as to improve gait speed for better home ambulation and to reduce fall risk.    Baseline 6/29: 0.08 m/s with RW. 11/15/2020= 0.093 m/s with RW 8/24: 0.15 m/s 12/7= 0.51ms with RW; 05/09/21: 0.059m w/ RW    Time 8    Period Weeks    Status Achieved    Target Date 07/11/21      PT LONG TERM GOAL #4   Title Patient will increase BLE gross strength to 4+/5 as to improve functional strength for independent gait, increased standing tolerance and increased ADL ability.    Baseline 6/29: see flowsheet. 8/24: hips grossly 4-/5 with hip extension 2+/5; L knee flexion 2+/5 knee extension 4/5; R knee 3+/5 10/17: see note 12/7: see note; 05/09/21: see note    Time 8    Period Weeks    Status Partially Met    Target Date 07/11/21      PT LONG TERM GOAL #5   Title Patient will ambulate 250 ft with least assistive device and Supervision with no breaks to allow for increased mobility within home.    Baseline 11/15/2020-Ambulation distance = approx 110 feet using pediatric bariatric walker 8/24: 155 ft with bariatric RW 10/17: 170 ft with bariatric RW 12/7: [id number] bariatric RW, see note; 05/09/21: [id number] bariatric RW    Time 8    Period Weeks    Status Partially Met    Target Date 07/11/21      PT LONG TERM GOAL #6   Title Patient will increase 10 meter walk test to >0.5 m/s ( 30 seconds) as to improve gait speed for better home ambulation and to reduce fall risk.    Baseline 8/24: 61 seconds: 0.15 m/s 10/17: 64 seconds ~0.15 m/s 12/7: 0.60m/67m1.5s); 05/09/21: 0.51m/s1mTime 8    Period Weeks    Status Partially Met    Target Date 07/11/21      PT LONG TERM GOAL #7   Title Patient (< 60  ye20s old) will complete five times sit to stand test in < 20 seconds with SUE support  indicating an increased LE strength and improved balance.    Baseline 10/17: 19.23 seconds with BUE support 12/7: 20.21s with SUE on RW, SUE on chair; 05/09/21: 35 seconds    Time 8    Period Weeks    Status On-going    Target Date 07/04/21                   Plan - 06/04/21 1540     Clinical Impression Statement Patient tolerated progressive stability and mobility interventions with decreased episodic LOB. He is challenged with turning which will continue to be an area of focus in future sessions. Cueing is required to keep feet within walker and keep walker in a safe range. Patient does have knee hyperextension with fatigue in addition to increased trunk flexion. He would benefit from additional skilled PT Intervention to improve strength, balance and mobility    Personal Factors and Comorbidities Comorbidity 3+;Education;Past/Current Experience;Time since onset of injury/illness/exacerbation;Transportation    Comorbidities HTN, arthritis, past CVA, high fall risk, obesity    Examination-Activity Limitations Bed Mobility;Locomotion Level;Squat;Stairs;Stand;Toileting;Transfers    Examination-Participation Restrictions Community Activity;Driving;Cleaning;Meal Prep;Shop;Volunteer;Yard Work    Stability/Clinical Decision Making Stable/Uncomplicated    Rehab Potential Good    PT Frequency 2x / week  PT Duration 8 weeks    PT Treatment/Interventions ADLs/Self Care Home Management;Cryotherapy;Moist Heat;Gait training;Stair training;Functional mobility training;Therapeutic activities;Therapeutic exercise;Balance training;Neuromuscular re-education;Patient/family education;Orthotic Fit/Training;Passive range of motion;Energy conservation    PT Next Visit Plan Continue with progessive LE strengthening and gait/transfer training.    PT Home Exercise Plan no updates  this session; Reminder to patient to  continue with daily walking    Consulted and Agree with Plan of Care Patient             Patient will benefit from skilled therapeutic intervention in order to improve the following deficits and impairments:  Abnormal gait, Decreased balance, Decreased endurance, Decreased mobility, Difficulty walking, Hypomobility, Obesity, Improper body mechanics, Decreased activity tolerance, Decreased strength, Postural dysfunction  Visit Diagnosis: Abnormality of gait and mobility  Difficulty in walking, not elsewhere classified  Muscle weakness (generalized)  Unsteadiness on feet     Problem List Patient Active Problem List   Diagnosis Date Noted   Impaired glucose tolerance 03/23/2020   Learning disability 03/23/2020   Urinary and fecal incontinence 06/14/2019   Chronic pain syndrome 03/23/2019   Constipation 03/23/2019   Dysarthria 03/23/2019   Dysphagia 03/23/2019   Learning difficulty 03/23/2019   Morbid obesity ([place]) 03/23/2019   Muscle weakness 03/23/2019   Vitamin D deficiency 03/23/2019   Bilateral carotid artery stenosis 12/15/2018   Moderate aortic valve stenosis 12/15/2018   BMI 45.0-49.9, adult ([place]) 11/10/2018   Cerebrovascular accident ([place]) 05/02/2016   Hemiparesis affecting right side as late effect of cerebrovascular accident ([place]) 05/01/2016   Brain tumor ([place]) 11/24/2014   Gastro-esophageal reflux disease without esophagitis 11/24/2014   Accident due to mechanical fall without injury 08/03/2014   Essential hypertension 06/13/2014   Mixed hyperlipidemia 06/13/2014   Unspecified sequelae of cerebral infarction 06/13/2014   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  [place], [place], [address] Phone: [phone]   Fax:  [phone]  Name: Curtis Powell MRN: [id number] Date of Birth: Apr 02, 1966

## 2021-06-06 ENCOUNTER — Ambulatory Visit: Payer: Medicare Other | Attending: Family Medicine

## 2021-06-06 ENCOUNTER — Ambulatory Visit: Payer: Medicare Other

## 2021-06-06 ENCOUNTER — Other Ambulatory Visit: Payer: Self-pay

## 2021-06-06 DIAGNOSIS — R269 Unspecified abnormalities of gait and mobility: Secondary | ICD-10-CM | POA: Insufficient documentation

## 2021-06-06 DIAGNOSIS — M6281 Muscle weakness (generalized): Secondary | ICD-10-CM | POA: Diagnosis present

## 2021-06-06 DIAGNOSIS — R262 Difficulty in walking, not elsewhere classified: Secondary | ICD-10-CM | POA: Diagnosis present

## 2021-06-06 DIAGNOSIS — R2681 Unsteadiness on feet: Secondary | ICD-10-CM | POA: Insufficient documentation

## 2021-06-06 NOTE — Therapy (Signed)
[place] MAIN [facility] SERVICES [address] [place], [place], [address] Phone: [phone]   Fax:  [phone]  Physical Therapy Treatment  Patient Details  Name: Curtis Powell MRN: [id number] Date of Birth: [birth year] Referring Provider (PT): Kym Groom MD   Encounter Date: 06/06/2021   PT End of Session - 06/06/21 1245     Visit Number 70    Number of Visits 53    Date for PT Re-Evaluation 07/11/21    Authorization Type UHC Medicare; [place] Medicaid    PT Start Time 1259    PT Stop Time 1344    PT Time Calculation (min) 45 min    Equipment Utilized During Treatment Gait belt    Activity Tolerance Patient limited by fatigue;Patient tolerated treatment well    Behavior During Therapy WFL for tasks assessed/performed             Past Medical History:  Diagnosis Date   GERD (gastroesophageal reflux disease)    Hyperlipidemia    Hypertension    Obesity    Stroke [facility])     Past Surgical History:  Procedure Laterality Date   BRAIN SURGERY      There were no vitals filed for this visit.   Subjective Assessment - 06/06/21 1353     Subjective Patient presents with his sister. She reports they have walked outside yesterday and did his standing interventions.    Pertinent History Patient is a pleasant 56 year old male who presents to physical therapy for weakness and immobility secondary to CVA.  Had a stroke in Feb 2018. He was previously fully independent, but this stroke caused severe residual deficits, mainly on the right side. He reports difficulty walking and decreased mobility. His mom had to move in with him and now is his main caregiver.  Patient had outpatient PT from November 2018-March 2022. Patient reports since being discharged from outpatient therapy he has kept up with his HEP daily. He does report desire for walking better and for getting in and out of bed easier. He reports his mom is helping with some ADLs. He reports needing  help with rolling in bed. He reports his mom/aide assist with dressing. He has a home aide 3 days out of the week;    Limitations Standing;Walking    How long can you sit comfortably? NA    How long can you stand comfortably? <30 min, does minimal standing;    How long can you walk comfortably? depends on the day, fatigue affects his walking; He is walking every day, about 15-20 min;    Diagnostic tests None recent    Patient Stated Goals to walk better and be independent in bed mobility;    Currently in Pain? No/denies                  Therapeutic Exercises:       ambulate 110 ft with CGA, wheelchair follow. with 2 big turns with two seated rest breaks -Patient exhibits forward flexed posture and increased lateral trunk lean during swing phase due to gluteal weakness. He required VCs for improved posture and to increase hip flexion during swing for better step length. Patient able to exhibit reciprocal gait pattern throughout walking with intermittent normal step length (beginning prior to onset of fatigue).     seated: Lateral step over hedgehog 10x each LE; unable to perform with RLE requires use of theraband and AAROM.  YTB df 12x each LE   Neuro  Re-ed:  sit to stand from chair, walk 3 ft to hedgehog; turn around [place] and return to chair sit down x2 trials; very slow requires max cueing for foot placement and walker placement  Seated on dynadisc  -static sit 60 seconds -static sit against pertubations x 2 minutes -alternating UE raises 10x each UE -attempted LAQ; patient unable to perform    Pt educated throughout session about proper posture and technique with exercises. Improved exercise technique, movement at target joints, use of target muscles after min to mod verbal, visual, tactile cues.      Patient is very challenged by turning with slow speed and poor foot clearance. This will continue to be an area of focus in future sessions. His RLE was weaker this  session requiring increased assistance for lateral movements in seated. His core is progressing with decreased instability on unstable surface. Per sister patient's mother requests PT to call her. Attempted calling her; left voicemail. He would benefit from additional skilled PT Intervention to improve strength, balance and mobility                PT Education - 06/06/21 1245     Education Details exercise technique, body mechanics    Person(s) Educated Patient    Methods Explanation;Demonstration;Tactile cues;Verbal cues    Comprehension Verbalized understanding;Returned demonstration;Verbal cues required;Tactile cues required              PT Short Term Goals - 05/16/21 1502       PT SHORT TERM GOAL #1   Title Patient will be adherent to HEP at least 3x a week to improve functional strength and balance for better safety at home.    Baseline 11/15/2020- Patient reports performing seated LE hip march, knee ext, sit to stand and walking daily and states no questions. Advised patient to practice static standing using his walker or kitchen counter several times per day without UE support.    Time 4    Period Weeks    Status Achieved    Target Date 11/01/20      PT SHORT TERM GOAL #2   Title Patient will be independent in bed mobility including sit to supine utilizing bed rails as needed for increased mobility at home.    Baseline 6/29: requires min A 8/24: patient reports needing mod A 12/7: patient reports needing min<>mod A; 05/09/21 MOD A for BLE assist during sit>sup    Time 4    Period Weeks    Status On-going    Target Date 06/13/21               PT Long Term Goals - 05/16/21 1502       PT LONG TERM GOAL #1   Title Patient will improve FOTO score to > 50% to indicate improve functional mobility at home and in community.    Baseline 6/29: 42% 8/24: 40% 10/17: 40% 12/12: 46%; 05/09/21: 34%    Time 8    Period Weeks    Status On-going    Target Date 07/11/21       PT LONG TERM GOAL #2   Title Patient (< 62 years old) will complete five times sit to stand test in < 20 seconds indicating an increased LE strength and improved balance.    Baseline 6/29: 23.8 sec with 2HHA. 11/15/2020= 48 sec with BUE Support and CGA using gait belt. 8/24: 23 seconds with 2 HHA 10/17: 19.23 seconds with BUE support 12/7: 20.21s BUE; 05/09/21: 35 seconds  with BUE support and support using gait belt    Time 8    Period Weeks    Status On-going    Target Date 07/11/21      PT LONG TERM GOAL #3   Title Patient will increase 10 meter walk test to >0.15 m/s (60 sec or less) as to improve gait speed for better home ambulation and to reduce fall risk.    Baseline 6/29: 0.08 m/s with RW. 11/15/2020= 0.093 m/s with RW 8/24: 0.15 m/s 12/7= 0.28ms with RW; 05/09/21: 0.070m w/ RW    Time 8    Period Weeks    Status Achieved    Target Date 07/11/21      PT LONG TERM GOAL #4   Title Patient will increase BLE gross strength to 4+/5 as to improve functional strength for independent gait, increased standing tolerance and increased ADL ability.    Baseline 6/29: see flowsheet. 8/24: hips grossly 4-/5 with hip extension 2+/5; L knee flexion 2+/5 knee extension 4/5; R knee 3+/5 10/17: see note 12/7: see note; 05/09/21: see note    Time 8    Period Weeks    Status Partially Met    Target Date 07/11/21      PT LONG TERM GOAL #5   Title Patient will ambulate 250 ft with least assistive device and Supervision with no breaks to allow for increased mobility within home.    Baseline 11/15/2020-Ambulation distance = approx 110 feet using pediatric bariatric walker 8/24: 155 ft with bariatric RW 10/17: 170 ft with bariatric RW 12/7: [id number] bariatric RW, see note; 05/09/21: [id number] bariatric RW    Time 8    Period Weeks    Status Partially Met    Target Date 07/11/21      PT LONG TERM GOAL #6   Title Patient will increase 10 meter walk test to >0.5 m/s ( 30 seconds) as to improve gait speed  for better home ambulation and to reduce fall risk.    Baseline 8/24: 61 seconds: 0.15 m/s 10/17: 64 seconds ~0.15 m/s 12/7: 0.57m/80m1.5s); 05/09/21: 0.80m/s77mTime 8    Period Weeks    Status Partially Met    Target Date 07/11/21      PT LONG TERM GOAL #7   Title Patient (< 60 yea25 old) will complete five times sit to stand test in < 20 seconds with SUE support  indicating an increased LE strength and improved balance.    Baseline 10/17: 19.23 seconds with BUE support 12/7: 20.21s with SUE on RW, SUE on chair; 05/09/21: 35 seconds    Time 8    Period Weeks    Status On-going    Target Date 07/04/21                   Plan - 06/06/21 1358     Clinical Impression Statement Patient is very challenged by turning with slow speed and poor foot clearance. This will continue to be an area of focus in future sessions. His RLE was weaker this session requiring increased assistance for lateral movements in seated. His core is progressing with decreased instability on unstable surface. Per sister patient's mother requests PT to call her. Attempted calling her; left voicemail. He would benefit from additional skilled PT Intervention to improve strength, balance and mobility    Personal Factors and Comorbidities Comorbidity 3+;Education;Past/Current Experience;Time since onset of injury/illness/exacerbation;Transportation    Comorbidities HTN, arthritis, past CVA, high fall risk,  obesity    Examination-Activity Limitations Bed Mobility;Locomotion Level;Squat;Stairs;Stand;Toileting;Transfers    Examination-Participation Restrictions Community Activity;Driving;Cleaning;Meal Prep;Shop;Volunteer;Yard Work    Stability/Clinical Decision Making Stable/Uncomplicated    Rehab Potential Good    PT Frequency 2x / week    PT Duration 8 weeks    PT Treatment/Interventions ADLs/Self Care Home Management;Cryotherapy;Moist Heat;Gait training;Stair training;Functional mobility training;Therapeutic  activities;Therapeutic exercise;Balance training;Neuromuscular re-education;Patient/family education;Orthotic Fit/Training;Passive range of motion;Energy conservation    PT Next Visit Plan Continue with progessive LE strengthening and gait/transfer training.    PT Home Exercise Plan no updates  this session; Reminder to patient to continue with daily walking    Consulted and Agree with Plan of Care Patient             Patient will benefit from skilled therapeutic intervention in order to improve the following deficits and impairments:  Abnormal gait, Decreased balance, Decreased endurance, Decreased mobility, Difficulty walking, Hypomobility, Obesity, Improper body mechanics, Decreased activity tolerance, Decreased strength, Postural dysfunction  Visit Diagnosis: Abnormality of gait and mobility  Difficulty in walking, not elsewhere classified  Muscle weakness (generalized)  Unsteadiness on feet     Problem List Patient Active Problem List   Diagnosis Date Noted   Impaired glucose tolerance 03/23/2020   Learning disability 03/23/2020   Urinary and fecal incontinence 06/14/2019   Chronic pain syndrome 03/23/2019   Constipation 03/23/2019   Dysarthria 03/23/2019   Dysphagia 03/23/2019   Learning difficulty 03/23/2019   Morbid obesity ([place]) 03/23/2019   Muscle weakness 03/23/2019   Vitamin D deficiency 03/23/2019   Bilateral carotid artery stenosis 12/15/2018   Moderate aortic valve stenosis 12/15/2018   BMI 45.0-49.9, adult ([place]) 11/10/2018   Cerebrovascular accident ([place]) 05/02/2016   Hemiparesis affecting right side as late effect of cerebrovascular accident ([place]) 05/01/2016   Brain tumor ([place]) 11/24/2014   Gastro-esophageal reflux disease without esophagitis 11/24/2014   Accident due to mechanical fall without injury 08/03/2014   Essential hypertension 06/13/2014   Mixed hyperlipidemia 06/13/2014   Unspecified sequelae of cerebral infarction 06/13/2014   Thyromegaly  01/07/2014   Type 2 diabetes mellitus without complication ([place]) 81/85/6314   Neck mass 09/08/2013   Swimmer's ear 09/08/2013   Erectile dysfunction 01/03/2013   Prediabetes 01/03/2013   Janna Arch, PT, DPT  06/06/2021, 1:59 PM  [facility] MAIN [facility] SERVICES [address] [place], [place], [address] Phone: [phone]   Fax:  [phone]  Name: Curtis Powell MRN: [id number] Date of Birth: 1965-10-09

## 2021-06-11 ENCOUNTER — Ambulatory Visit: Payer: Medicare Other

## 2021-06-11 ENCOUNTER — Other Ambulatory Visit: Payer: Self-pay

## 2021-06-11 DIAGNOSIS — R269 Unspecified abnormalities of gait and mobility: Secondary | ICD-10-CM | POA: Diagnosis not present

## 2021-06-11 DIAGNOSIS — R262 Difficulty in walking, not elsewhere classified: Secondary | ICD-10-CM

## 2021-06-11 DIAGNOSIS — R2681 Unsteadiness on feet: Secondary | ICD-10-CM

## 2021-06-11 DIAGNOSIS — M6281 Muscle weakness (generalized): Secondary | ICD-10-CM

## 2021-06-11 NOTE — Therapy (Signed)
[place] MAIN [facility] SERVICES [address] [place], [place], [address] Phone: [phone]   Fax:  [phone]  Physical Therapy Treatment  Patient Details  Name: Curtis Powell MRN: [id number] Date of Birth: 28-Feb-1966 Referring Provider (PT): Kym Groom MD   Encounter Date: 06/11/2021   PT End of Session - 06/11/21 1417     Visit Number 45    Number of Visits 80    Date for PT Re-Evaluation 07/11/21    Authorization Type [employer] Medicare; [place] Medicaid    PT Start Time 1345    PT Stop Time 1430    PT Time Calculation (min) 45 min    Equipment Utilized During Treatment Gait belt    Activity Tolerance Patient limited by fatigue;Patient tolerated treatment well    Behavior During Therapy WFL for tasks assessed/performed             Past Medical History:  Diagnosis Date   GERD (gastroesophageal reflux disease)    Hyperlipidemia    Hypertension    Obesity    Stroke [facility])     Past Surgical History:  Procedure Laterality Date   BRAIN SURGERY      There were no vitals filed for this visit.   Subjective Assessment - 06/11/21 1416     Subjective Patient presents with his sister. Has a new wheelchair. No falls or LOB, has been compliant with HEP.    Pertinent History Patient is a pleasant 56 year old male who presents to physical therapy for weakness and immobility secondary to CVA.  Had a stroke in Feb 2018. He was previously fully independent, but this stroke caused severe residual deficits, mainly on the right side. He reports difficulty walking and decreased mobility. His mom had to move in with him and now is his main caregiver.  Patient had outpatient PT from November 2018-March 2022. Patient reports since being discharged from outpatient therapy he has kept up with his HEP daily. He does report desire for walking better and for getting in and out of bed easier. He reports his mom is helping with some ADLs. He reports needing help with  rolling in bed. He reports his mom/aide assist with dressing. He has a home aide 3 days out of the week;    Limitations Standing;Walking    How long can you sit comfortably? NA    How long can you stand comfortably? <30 min, does minimal standing;    How long can you walk comfortably? depends on the day, fatigue affects his walking; He is walking every day, about 15-20 min;    Diagnostic tests None recent    Patient Stated Goals to walk better and be independent in bed mobility;    Currently in Pain? No/denies                 Therapeutic Exercises:    ambulate across stable and unstable surface outside. Negotiating changing surfaces from cement to sidewalk, across brick with turns and obstacles in pathway without LOB. Ambulate with bRW and two near LOB requiring mod A to remain upright. Increased inversion of L foot noted with fatigue. Patient very challenged with incline and decline requiring two seated rest breaks x 26 minutes    seated: STS 5x; two posterior LOB due to insufficient gluteal activation.  GTB abduction 20x  RTB hamstring curl 15x each LE     Pt educated throughout session about proper posture and technique with exercises. Improved exercise technique, movement at  target joints, use of target muscles after min to mod verbal, visual, tactile cues.   Patient has increased instability when negotiating inclines and unstable surfaces outside. He additionally has increased challenge with turns/negotiation of obstacles this session. Patient is highly motivated despite increased instability this session. Increased inversion of R foot noted with prolonged ambulation potentially causing the decreased stability. He would benefit from additional skilled PT Intervention to improve strength, balance and mobility                      PT Education - 06/11/21 1417     Education Details exercise technique, body mechanics    Person(s) Educated Patient    Methods  Explanation;Demonstration;Tactile cues;Verbal cues    Comprehension Verbalized understanding;Need further instruction;Returned demonstration;Verbal cues required;Tactile cues required              PT Short Term Goals - 05/16/21 1502       PT SHORT TERM GOAL #1   Title Patient will be adherent to HEP at least 3x a week to improve functional strength and balance for better safety at home.    Baseline 11/15/2020- Patient reports performing seated LE hip march, knee ext, sit to stand and walking daily and states no questions. Advised patient to practice static standing using his walker or kitchen counter several times per day without UE support.    Time 4    Period Weeks    Status Achieved    Target Date 11/01/20      PT SHORT TERM GOAL #2   Title Patient will be independent in bed mobility including sit to supine utilizing bed rails as needed for increased mobility at home.    Baseline 6/29: requires min A 8/24: patient reports needing mod A 12/7: patient reports needing min<>mod A; 05/09/21 MOD A for BLE assist during sit>sup    Time 4    Period Weeks    Status On-going    Target Date 06/13/21               PT Long Term Goals - 05/16/21 1502       PT LONG TERM GOAL #1   Title Patient will improve FOTO score to > 50% to indicate improve functional mobility at home and in community.    Baseline 6/29: 42% 8/24: 40% 10/17: 40% 12/12: 46%; 05/09/21: 34%    Time 8    Period Weeks    Status On-going    Target Date 07/11/21      PT LONG TERM GOAL #2   Title Patient (< 3 years old) will complete five times sit to stand test in < 20 seconds indicating an increased LE strength and improved balance.    Baseline 6/29: 23.8 sec with 2HHA. 11/15/2020= 48 sec with BUE Support and CGA using gait belt. 8/24: 23 seconds with 2 HHA 10/17: 19.23 seconds with BUE support 12/7: 20.21s BUE; 05/09/21: 35 seconds with BUE support and support using gait belt    Time 8    Period Weeks    Status  On-going    Target Date 07/11/21      PT LONG TERM GOAL #3   Title Patient will increase 10 meter walk test to >0.15 m/s (60 sec or less) as to improve gait speed for better home ambulation and to reduce fall risk.    Baseline 6/29: 0.08 m/s with RW. 11/15/2020= 0.093 m/s with RW 8/24: 0.15 m/s 12/7= 0.10ms with RW; 05/09/21: 0.016m w/  RW    Time 8    Period Weeks    Status Achieved    Target Date 07/11/21      PT LONG TERM GOAL #4   Title Patient will increase BLE gross strength to 4+/5 as to improve functional strength for independent gait, increased standing tolerance and increased ADL ability.    Baseline 6/29: see flowsheet. 8/24: hips grossly 4-/5 with hip extension 2+/5; L knee flexion 2+/5 knee extension 4/5; R knee 3+/5 10/17: see note 12/7: see note; 05/09/21: see note    Time 8    Period Weeks    Status Partially Met    Target Date 07/11/21      PT LONG TERM GOAL #5   Title Patient will ambulate 250 ft with least assistive device and Supervision with no breaks to allow for increased mobility within home.    Baseline 11/15/2020-Ambulation distance = approx 110 feet using pediatric bariatric walker 8/24: 155 ft with bariatric RW 10/17: 170 ft with bariatric RW 12/7: 142f with bariatric RW, see note; 05/09/21: 842fwith bariatric RW    Time 8    Period Weeks    Status Partially Met    Target Date 07/11/21      PT LONG TERM GOAL #6   Title Patient will increase 10 meter walk test to >0.5 m/s ( 30 seconds) as to improve gait speed for better home ambulation and to reduce fall risk.    Baseline 8/24: 61 seconds: 0.15 m/s 10/17: 64 seconds ~0.15 m/s 12/7: 0.1980m(51.5s); 05/09/21: 0.50m86m  Time 8    Period Weeks    Status Partially Met    Target Date 07/11/21      PT LONG TERM GOAL #7   Title Patient (< 60 y74rs old) will complete five times sit to stand test in < 20 seconds with SUE support  indicating an increased LE strength and improved balance.    Baseline 10/17: 19.23  seconds with BUE support 12/7: 20.21s with SUE on RW, SUE on chair; 05/09/21: 35 seconds    Time 8    Period Weeks    Status On-going    Target Date 07/04/21                   Plan - 06/11/21 1419     Clinical Impression Statement Patient has increased instability when negotiating inclines and unstable surfaces outside. He additionally has increased challenge with turns/negotiation of obstacles this session. Patient is highly motivated despite increased instability this session. Increased inversion of R foot noted with prolonged ambulation potentially causing the decreased stability. He would benefit from additional skilled PT Intervention to improve strength, balance and mobility    Personal Factors and Comorbidities Comorbidity 3+;Education;Past/Current Experience;Time since onset of injury/illness/exacerbation;Transportation    Comorbidities HTN, arthritis, past CVA, high fall risk, obesity    Examination-Activity Limitations Bed Mobility;Locomotion Level;Squat;Stairs;Stand;Toileting;Transfers    Examination-Participation Restrictions Community Activity;Driving;Cleaning;Meal Prep;Shop;Volunteer;Yard Work    Stability/Clinical Decision Making Stable/Uncomplicated    Rehab Potential Good    PT Frequency 2x / week    PT Duration 8 weeks    PT Treatment/Interventions ADLs/Self Care Home Management;Cryotherapy;Moist Heat;Gait training;Stair training;Functional mobility training;Therapeutic activities;Therapeutic exercise;Balance training;Neuromuscular re-education;Patient/family education;Orthotic Fit/Training;Passive range of motion;Energy conservation    PT Next Visit Plan Continue with progessive LE strengthening and gait/transfer training.    PT Home Exercise Plan no updates  this session; Reminder to patient to continue with daily walking    Consulted and Agree with  Plan of Care Patient             Patient will benefit from skilled therapeutic intervention in order to improve  the following deficits and impairments:  Abnormal gait, Decreased balance, Decreased endurance, Decreased mobility, Difficulty walking, Hypomobility, Obesity, Improper body mechanics, Decreased activity tolerance, Decreased strength, Postural dysfunction  Visit Diagnosis: Abnormality of gait and mobility  Difficulty in walking, not elsewhere classified  Muscle weakness (generalized)  Unsteadiness on feet     Problem List Patient Active Problem List   Diagnosis Date Noted   Impaired glucose tolerance 03/23/2020   Learning disability 03/23/2020   Urinary and fecal incontinence 06/14/2019   Chronic pain syndrome 03/23/2019   Constipation 03/23/2019   Dysarthria 03/23/2019   Dysphagia 03/23/2019   Learning difficulty 03/23/2019   Morbid obesity ([place]) 03/23/2019   Muscle weakness 03/23/2019   Vitamin D deficiency 03/23/2019   Bilateral carotid artery stenosis 12/15/2018   Moderate aortic valve stenosis 12/15/2018   BMI 45.0-49.9, adult ([place]) 11/10/2018   Cerebrovascular accident ([place]) 05/02/2016   Hemiparesis affecting right side as late effect of cerebrovascular accident ([place]) 05/01/2016   Brain tumor ([place]) 11/24/2014   Gastro-esophageal reflux disease without esophagitis 11/24/2014   Accident due to mechanical fall without injury 08/03/2014   Essential hypertension 06/13/2014   Mixed hyperlipidemia 06/13/2014   Unspecified sequelae of cerebral infarction 06/13/2014   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/2014   Type 2 diabetes mellitus without complication ([place]) 61/60/7371   Neck mass 09/08/2013   Swimmer's ear 09/08/2013   Erectile dysfunction 01/03/2013   Prediabetes 01/03/2013   Janna Arch, PT, DPT  06/11/2021, 2:31 PM  [facility] MAIN [facility] SERVICES [address] [place], [place], [address] Phone: [phone]   Fax:  [phone]  Name: DAGMAWI VENABLE MRN: [id number] Date of Birth: 10-29-1965

## 2021-06-13 ENCOUNTER — Ambulatory Visit: Payer: Medicare Other

## 2021-06-18 ENCOUNTER — Ambulatory Visit: Payer: Medicare Other

## 2021-06-18 ENCOUNTER — Other Ambulatory Visit: Payer: Self-pay

## 2021-06-18 DIAGNOSIS — R2681 Unsteadiness on feet: Secondary | ICD-10-CM

## 2021-06-18 DIAGNOSIS — M6281 Muscle weakness (generalized): Secondary | ICD-10-CM

## 2021-06-18 DIAGNOSIS — R262 Difficulty in walking, not elsewhere classified: Secondary | ICD-10-CM

## 2021-06-18 DIAGNOSIS — R269 Unspecified abnormalities of gait and mobility: Secondary | ICD-10-CM | POA: Diagnosis not present

## 2021-06-18 NOTE — Therapy (Signed)
[place] MAIN [facility] SERVICES [address] [place], [place], [address] Phone: [phone]   Fax:  [phone]  Physical Therapy Treatment/ Physical Therapy Progress Note   Dates of reporting period  05/09/21   to   06/18/21   Patient Details  Name: Curtis Powell MRN: [id number] Date of Birth: December 26, 1965 Referring Provider (PT): Kym Groom MD   Encounter Date: 06/18/2021   PT End of Session - 06/18/21 1443     Visit Number 60    Number of Visits 54    Date for PT Re-Evaluation 07/11/21    Authorization Type UHC Medicare; [place] Medicaid    Authorization Time Period next session 1/10 PN 3/13    PT Start Time 1345    PT Stop Time 1432    PT Time Calculation (min) 47 min    Equipment Utilized During Treatment Gait belt    Activity Tolerance Patient limited by fatigue;Patient tolerated treatment well    Behavior During Therapy WFL for tasks assessed/performed             Past Medical History:  Diagnosis Date   GERD (gastroesophageal reflux disease)    Hyperlipidemia    Hypertension    Obesity    Stroke [facility])     Past Surgical History:  Procedure Laterality Date   BRAIN SURGERY      There were no vitals filed for this visit.   Subjective Assessment - 06/18/21 1444     Subjective Patient reports compliance with HEP. Is having issues with aide being covered, will talk with social work again.    Pertinent History Patient is a pleasant 56 year old male who presents to physical therapy for weakness and immobility secondary to CVA.  Had a stroke in Feb 2018. He was previously fully independent, but this stroke caused severe residual deficits, mainly on the right side. He reports difficulty walking and decreased mobility. His mom had to move in with him and now is his main caregiver.  Patient had outpatient PT from November 2018-March 2022. Patient reports since being discharged from outpatient therapy he has kept up with his HEP daily. He does  report desire for walking better and for getting in and out of bed easier. He reports his mom is helping with some ADLs. He reports needing help with rolling in bed. He reports his mom/aide assist with dressing. He has a home aide 3 days out of the week;    Limitations Standing;Walking    How long can you sit comfortably? NA    How long can you stand comfortably? <30 min, does minimal standing;    How long can you walk comfortably? depends on the day, fatigue affects his walking; He is walking every day, about 15-20 min;    Diagnostic tests None recent    Patient Stated Goals to walk better and be independent in bed mobility;    Currently in Pain? No/denies                  BLE strength  Right Left  Hip flexion 4+ 4+  Hip Abduction 4 4  Hip Adduction 3+ 3+  Knee Extension  4 4  Knee Flexion 3+ 3  DF 2+ 3  PF 3+ 3+    Ambulate 250 ft : rest breaks at 55, 70, 20 ft; total 145 ft  10 MWT: 1 minute 10 seconds =0.14 m/s  5x STS: 17.81 with one hand on walker one hand on chair. [place]  seconds to full stand with three posterior LOB  FOTO: 40%     Treatment  Seated with # 5 ankle weights  -Seated marches with upright posture, back away from back of chair for abdominal/trunk activation/stabilization, 10x each LE -Seated LAQ with 3 second holds, 10x each LE, cueing for muscle activation and sequencing for neutral alignment -Seated IR/ER with cueing for stabilizing knee placement with lateral foot movement for optimal muscle recruitment, 10x each LE     Patient's condition has the potential to improve in response to therapy. Maximum improvement is yet to be obtained. The anticipated improvement is attainable and reasonable in a generally predictable time.  Patient reports he is working on his HEP at home but still limited in walking and getting around.    Patient has improved duration of ambulation and speed of ambulation during testing. His sit to stands are slower however with  more frequent posterior LOB. There has been issues with aide payment as patient's aide is no longer being paid to be present at PT sessions, will reach out to social services again in regards to this as patient is in definite need of aide during PT session. Patient's condition has the potential to improve in response to therapy. Maximum improvement is yet to be obtained. The anticipated improvement is attainable and reasonable in a generally predictable time.He would benefit from additional skilled PT Intervention to improve strength, balance and mobility              PT Education - 06/18/21 1446     Education Details goals, exercise technique    Person(s) Educated Patient    Methods Explanation;Demonstration;Tactile cues;Verbal cues    Comprehension Verbalized understanding;Returned demonstration;Verbal cues required;Tactile cues required              PT Short Term Goals - 05/16/21 1502       PT SHORT TERM GOAL #1   Title Patient will be adherent to HEP at least 3x a week to improve functional strength and balance for better safety at home.    Baseline 11/15/2020- Patient reports performing seated LE hip march, knee ext, sit to stand and walking daily and states no questions. Advised patient to practice static standing using his walker or kitchen counter several times per day without UE support.    Time 4    Period Weeks    Status Achieved    Target Date 11/01/20      PT SHORT TERM GOAL #2   Title Patient will be independent in bed mobility including sit to supine utilizing bed rails as needed for increased mobility at home.    Baseline 6/29: requires min A 8/24: patient reports needing mod A 12/7: patient reports needing min<>mod A; 05/09/21 MOD A for BLE assist during sit>sup    Time 4    Period Weeks    Status On-going    Target Date 06/13/21               PT Long Term Goals - 06/18/21 1425       PT LONG TERM GOAL #1   Title Patient will improve FOTO score to  > 50% to indicate improve functional mobility at home and in community.    Baseline 6/29: 42% 8/24: 40% 10/17: 40% 12/12: 46%; 05/09/21: 34% 3/13: 40%    Time 8    Period Weeks    Status Partially Met    Target Date 07/11/21      PT LONG TERM GOAL #  2   Title Patient (< 12 years old) will complete five times sit to stand test in < 20 seconds indicating an increased LE strength and improved balance.    Baseline 6/29: 23.8 sec with 2HHA. 11/15/2020= 48 sec with BUE Support and CGA using gait belt. 8/24: 23 seconds with 2 HHA 10/17: 19.23 seconds with BUE support 12/7: 20.21s BUE; 05/09/21: 35 seconds with BUE support and support using gait belt 3/13: 17.81 with one hand on walker one hand on chair. 48 seconds to full stand with three posterior LOB    Time 8    Period Weeks    Status On-going    Target Date 07/11/21      PT LONG TERM GOAL #3   Title Patient will increase 10 meter walk test to >0.15 m/s (60 sec or less) as to improve gait speed for better home ambulation and to reduce fall risk.    Baseline 6/29: 0.08 m/s with RW. 11/15/2020= 0.093 m/s with RW 8/24: 0.15 m/s 12/7= 0.20ms with RW; 05/09/21: 0.045m w/ RW    Time 8    Period Weeks    Status Achieved    Target Date 07/11/21      PT LONG TERM GOAL #4   Title Patient will increase BLE gross strength to 4+/5 as to improve functional strength for independent gait, increased standing tolerance and increased ADL ability.    Baseline 6/29: see flowsheet. 8/24: hips grossly 4-/5 with hip extension 2+/5; L knee flexion 2+/5 knee extension 4/5; R knee 3+/5 10/17: see note 12/7: see note; 05/09/21: see note 3/13; see note    Time 8    Period Weeks    Status Partially Met    Target Date 07/11/21      PT LONG TERM GOAL #5   Title Patient will ambulate 250 ft with least assistive device and Supervision with no breaks to allow for increased mobility within home.    Baseline 11/15/2020-Ambulation distance = approx 110 feet using pediatric bariatric  walker 8/24: 155 ft with bariatric RW 10/17: 170 ft with bariatric RW 12/7: [id number] bariatric RW, see note; 05/09/21: [id number] bariatric RW 3/13: rest breaks at 55, 70, 20 ft; total 145 ft    Time 8    Period Weeks    Status Partially Met    Target Date 07/11/21      PT LONG TERM GOAL #6   Title Patient will increase 10 meter walk test to >0.5 m/s ( 30 seconds) as to improve gait speed for better home ambulation and to reduce fall risk.    Baseline 8/24: 61 seconds: 0.15 m/s 10/17: 64 seconds ~0.15 m/s 12/7: 0.55m/55m1.5s); 05/09/21: 0.62m/s43m3: 70 seconds: 0.14 m/s    Time 8    Period Weeks    Status Partially Met    Target Date 07/11/21      PT LONG TERM GOAL #7   Title Patient (< 60 yea68 old) will complete five times sit to stand test in < 20 seconds with SUE support  indicating an increased LE strength and improved balance.    Baseline 10/17: 19.23 seconds with BUE support 12/7: 20.21s with SUE on RW, SUE on chair; 05/09/21: 35 seconds    Time 8    Period Weeks    Status On-going    Target Date 07/04/21                   Plan - 06/18/21 1440  Clinical Impression Statement Patient has improved duration of ambulation and speed of ambulation during testing. His sit to stands are slower however with more frequent posterior LOB. There has been issues with aide payment as patient's aide is no longer being paid to be present at PT sessions, will reach out to social services again in regards to this as patient is in definite need of aide during PT session. Patient's condition has the potential to improve in response to therapy. Maximum improvement is yet to be obtained. The anticipated improvement is attainable and reasonable in a generally predictable time.He would benefit from additional skilled PT Intervention to improve strength, balance and mobility    Personal Factors and Comorbidities Comorbidity 3+;Education;Past/Current Experience;Time since onset of  injury/illness/exacerbation;Transportation    Comorbidities HTN, arthritis, past CVA, high fall risk, obesity    Examination-Activity Limitations Bed Mobility;Locomotion Level;Squat;Stairs;Stand;Toileting;Transfers    Examination-Participation Restrictions Community Activity;Driving;Cleaning;Meal Prep;Shop;Volunteer;Yard Work    Stability/Clinical Decision Making Stable/Uncomplicated    Rehab Potential Good    PT Frequency 2x / week    PT Duration 8 weeks    PT Treatment/Interventions ADLs/Self Care Home Management;Cryotherapy;Moist Heat;Gait training;Stair training;Functional mobility training;Therapeutic activities;Therapeutic exercise;Balance training;Neuromuscular re-education;Patient/family education;Orthotic Fit/Training;Passive range of motion;Energy conservation    PT Next Visit Plan Continue with progessive LE strengthening and gait/transfer training.    PT Home Exercise Plan no updates  this session; Reminder to patient to continue with daily walking    Consulted and Agree with Plan of Care Patient             Patient will benefit from skilled therapeutic intervention in order to improve the following deficits and impairments:  Abnormal gait, Decreased balance, Decreased endurance, Decreased mobility, Difficulty walking, Hypomobility, Obesity, Improper body mechanics, Decreased activity tolerance, Decreased strength, Postural dysfunction  Visit Diagnosis: Abnormality of gait and mobility  Difficulty in walking, not elsewhere classified  Muscle weakness (generalized)  Unsteadiness on feet     Problem List Patient Active Problem List   Diagnosis Date Noted   Impaired glucose tolerance 03/23/2020   Learning disability 03/23/2020   Urinary and fecal incontinence 06/14/2019   Chronic pain syndrome 03/23/2019   Constipation 03/23/2019   Dysarthria 03/23/2019   Dysphagia 03/23/2019   Learning difficulty 03/23/2019   Morbid obesity ([place]) 03/23/2019   Muscle weakness  03/23/2019   Vitamin D deficiency 03/23/2019   Bilateral carotid artery stenosis 12/15/2018   Moderate aortic valve stenosis 12/15/2018   BMI 45.0-49.9, adult ([place]) 11/10/2018   Cerebrovascular accident ([place]) 05/02/2016   Hemiparesis affecting right side as late effect of cerebrovascular accident ([place]) 05/01/2016   Brain tumor ([place]) 11/24/2014   Gastro-esophageal reflux disease without esophagitis 11/24/2014   Accident due to mechanical fall without injury 08/03/2014   Essential hypertension 06/13/2014   Mixed hyperlipidemia 06/13/2014   Unspecified sequelae of cerebral infarction 06/13/2014   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/2014   Type 2 diabetes mellitus without complication ([place]) 77/02/6578   Neck mass 09/08/2013   Swimmer's ear 09/08/2013   Erectile dysfunction 01/03/2013   Prediabetes 01/03/2013  Janna Arch, PT, DPT  06/18/2021, 2:47 PM  [facility] MAIN [facility] SERVICES [address] [place], [place], [address] Phone: [phone]   Fax:  [phone]  Name: KARELL TUKES MRN: [id number] Date of Birth: 1965-12-19

## 2021-06-20 ENCOUNTER — Ambulatory Visit: Payer: Medicare Other

## 2021-06-20 ENCOUNTER — Other Ambulatory Visit: Payer: Self-pay

## 2021-06-20 DIAGNOSIS — M6281 Muscle weakness (generalized): Secondary | ICD-10-CM

## 2021-06-20 DIAGNOSIS — R2681 Unsteadiness on feet: Secondary | ICD-10-CM

## 2021-06-20 DIAGNOSIS — R262 Difficulty in walking, not elsewhere classified: Secondary | ICD-10-CM

## 2021-06-20 DIAGNOSIS — R269 Unspecified abnormalities of gait and mobility: Secondary | ICD-10-CM

## 2021-06-20 NOTE — Therapy (Signed)
[place] ?[place] [facility]IN REHAB SERVICES ?[place], [place], [address] ?Phone: [phone]   Fax:  [phone] ? ?Physical Therapy Treatment ? ?Patient Details  ?Name: Curtis Powell ?MRN: [id number] ?Date of Birth: [birth year] ?Referring Provider (PT): Olmedo MD ? ? ?Encounter Date: 06/20/2021 ? ? PT End of Session - 06/20/21 1324   ? ? Visit Number 48   ? Number of Visits 68   ? Date for PT Re-Evaluation 07/11/21   ? Authorization Type UHC Medicare; [place] Medicaid   ? Authorization Time Period 1/10 PN 3/13   ? PT Start Time 1300   ? PT Stop Time 8242   ? PT Time Calculation (min) 45 min   ? Equipment Utilized During Treatment Gait belt   ? Activity Tolerance Patient limited by fatigue;Patient tolerated treatment well   ? Behavior During Therapy [facility] for tasks assessed/performed   ? ?  ?  ? ?  ? ? ?Past Medical History:  ?Diagnosis Date  ? GERD (gastroesophageal reflux disease)   ? Hyperlipidemia   ? Hypertension   ? Obesity   ? Stroke [facility])   ? ? ?Past Surgical History:  ?Procedure Laterality Date  ? BRAIN SURGERY    ? ? ?There were no vitals filed for this visit. ? ? Subjective Assessment - 06/20/21 1306   ? ? Subjective Patient reports he has been doing his exercises. Still have not heard back yet about aide being covered.   ? Pertinent History Patient is a pleasant 56 year old male who presents to physical therapy for weakness and immobility secondary to CVA.  Had a stroke in Feb 2018. He was previously fully independent, but this stroke caused severe residual deficits, mainly on the right side. He reports difficulty walking and decreased mobility. His mom had to move in with him and now is his main caregiver.  Patient had outpatient PT from November 2018-March 2022. Patient reports since being discharged from outpatient therapy he has kept up with his HEP daily. He does report desire for walking better and for getting in and out of bed easier. He reports his mom is helping  with some ADLs. He reports needing help with rolling in bed. He reports his mom/aide assist with dressing. He has a home aide 3 days out of the week;   ? Limitations Standing;Walking   ? How long can you sit comfortably? NA   ? How long can you stand comfortably? <30 min, does minimal standing;   ? How long can you walk comfortably? depends on the day, fatigue affects his walking; He is walking every day, about 15-20 min;   ? Diagnostic tests None recent   ? Patient Stated Goals to walk better and be independent in bed mobility;   ? Currently in Pain? No/denies   ? ?  ?  ? ?  ? ? ? ? ? ? ? ? ? ?  ?Therapeutic Exercises:  ?  ?  ? attempted ambulating 12 ft; terminated due to three posterior LOB requiring assistance to not fall.  ? ?Seated on plinth table:  ?Sit to stand 10x; challenging to reach full stand; two rest breaks ?Lateral weight shift onto arm and return to center 10x each LE; very challenging ?RTB row 15x ?Pelvic tilts 10x  ?Rainbow ball overhead raises 15x; cue for upright posture ?Rainbow ball tosses for core activation and reaction timing x 3 minutes ? ? seated in chair ?Adduction ball squeezes 15x 3 second holds ?  RTB hamstring curl 15x each LE ? ?3lb bar: ?-row 15x; cue for upright posture ?-overhead press 15x  ?-witches brew clockwise 10x, counterclockwise 10x  ? ?Pt educated throughout session about proper posture and technique with exercises. Improved exercise technique, movement at target joints, use of target muscles after min to mod verbal, visual, tactile cues. ? ? ? ?Patient session is significantly limited by patient fatigue. Upon attempting to ambulate patient is unable and has frequent posterior LOB. Vitals are [facility]; patient reports he is just tired. Core stabilization and strengthening interventions tolerated well on plinth table as well as LE strengthening in seated position. He would benefit from additional skilled PT Intervention to improve strength, balance and  mobility ? ? ? ? ? ? ? ? ? ? ? ? ? ? ? PT Education - 06/20/21 1320   ? ? Education Details exercise technique, body mechanics   ? Person(s) Educated Patient   ? Methods Explanation;Demonstration;Tactile cues;Verbal cues   ? Comprehension Verbalized understanding;Returned demonstration;Verbal cues required;Tactile cues required   ? ?  ?  ? ?  ? ? ? PT Short Term Goals - 05/16/21 1502   ? ?  ? PT SHORT TERM GOAL #1  ? Title Patient will be adherent to HEP at least 3x a week to improve functional strength and balance for better safety at home.   ? Baseline 11/15/2020- Patient reports performing seated LE hip march, knee ext, sit to stand and walking daily and states no questions. Advised patient to practice static standing using his walker or kitchen counter several times per day without UE support.   ? Time 4   ? Period Weeks   ? Status Achieved   ? Target Date 11/01/20   ?  ? PT SHORT TERM GOAL #2  ? Title Patient will be independent in bed mobility including sit to supine utilizing bed rails as needed for increased mobility at home.   ? Baseline 6/29: requires min A 8/24: patient reports needing mod A 12/7: patient reports needing min<>mod A; 05/09/21 MOD A for BLE assist during sit>sup   ? Time 4   ? Period Weeks   ? Status On-going   ? Target Date 06/13/21   ? ?  ?  ? ?  ? ? ? ? PT Long Term Goals - 06/18/21 1425   ? ?  ? PT LONG TERM GOAL #1  ? Title Patient will improve FOTO score to > 50% to indicate improve functional mobility at home and in community.   ? Baseline 6/29: 42% 8/24: 40% 10/17: 40% 12/12: 46%; 05/09/21: 34% 3/13: 40%   ? Time 8   ? Period Weeks   ? Status Partially Met   ? Target Date 07/11/21   ?  ? PT LONG TERM GOAL #2  ? Title Patient (< 55 years old) will complete five times sit to stand test in < 20 seconds indicating an increased LE strength and improved balance.   ? Baseline 6/29: 23.8 sec with 2HHA. 11/15/2020= 48 sec with BUE Support and CGA using gait belt. 8/24: 23 seconds with 2 HHA 10/17:  19.23 seconds with BUE support 12/7: 20.21s BUE; 05/09/21: 35 seconds with BUE support and support using gait belt 3/13: 17.81 with one hand on walker one hand on chair. 48 seconds to full stand with three posterior LOB   ? Time 8   ? Period Weeks   ? Status On-going   ? Target Date 07/11/21   ?  ?  PT LONG TERM GOAL #3  ? Title Patient will increase 10 meter walk test to >0.15 m/s (60 sec or less) as to improve gait speed for better home ambulation and to reduce fall risk.   ? Baseline 6/29: 0.08 m/s with RW. 11/15/2020= 0.093 m/s with RW 8/24: 0.15 m/s 12/7= 0.19m/s with RW; 05/09/21: 0.07m/s w/ RW   ? Time 8   ? Period Weeks   ? Status Achieved   ? Target Date 07/11/21   ?  ? PT LONG TERM GOAL #4  ? Title Patient will increase BLE gross strength to 4+/5 as to improve functional strength for independent gait, increased standing tolerance and increased ADL ability.   ? Baseline 6/29: see flowsheet. 8/24: hips grossly 4-/5 with hip extension 2+/5; L knee flexion 2+/5 knee extension 4/5; R knee 3+/5 10/17: see note 12/7: see note; 05/09/21: see note 3/13; see note   ? Time 8   ? Period Weeks   ? Status Partially Met   ? Target Date 07/11/21   ?  ? PT LONG TERM GOAL #5  ? Title Patient will ambulate 250 ft with least assistive device and Supervision with no breaks to allow for increased mobility within home.   ? Baseline 11/15/2020-Ambulation distance = approx 110 feet using pediatric bariatric walker 8/24: 155 ft with bariatric RW 10/17: 170 ft with bariatric RW 12/7: 170ft with bariatric RW, see note; 05/09/21: 83ft with bariatric RW 3/13: rest breaks at 55, 70, 20 ft; total 145 ft   ? Time 8   ? Period Weeks   ? Status Partially Met   ? Target Date 07/11/21   ?  ? PT LONG TERM GOAL #6  ? Title Patient will increase 10 meter walk test to >0.5 m/s ( 30 seconds) as to improve gait speed for better home ambulation and to reduce fall risk.   ? Baseline 8/24: 61 seconds: 0.15 m/s 10/17: 64 seconds ~0.15 m/s 12/7: 0.19m/s (51.5s);  05/09/21: 0.07m/s 3/13: 70 seconds: 0.14 m/s   ? Time 8   ? Period Weeks   ? Status Partially Met   ? Target Date 07/11/21   ?  ? PT LONG TERM GOAL #7  ? Title Patient (< 60 years old) will complete five times sit to stand test in

## 2021-06-25 ENCOUNTER — Ambulatory Visit: Payer: Medicare Other

## 2021-06-25 ENCOUNTER — Other Ambulatory Visit: Payer: Self-pay

## 2021-06-25 DIAGNOSIS — R269 Unspecified abnormalities of gait and mobility: Secondary | ICD-10-CM

## 2021-06-25 DIAGNOSIS — R2681 Unsteadiness on feet: Secondary | ICD-10-CM

## 2021-06-25 DIAGNOSIS — M6281 Muscle weakness (generalized): Secondary | ICD-10-CM

## 2021-06-25 DIAGNOSIS — R262 Difficulty in walking, not elsewhere classified: Secondary | ICD-10-CM

## 2021-06-25 NOTE — Therapy (Signed)
[place] [facility] ?[place], [place], [address] ?Phone: [phone]   Fax:  [phone] ? ?Physical Therapy Treatment ? ?Patient Details  ?Name: Curtis Powell ?MRN: [id number] ?Date of Birth: 08-29-1965 ?Referring Provider (PT): Olmedo MD ? ? ?Encounter Date: 06/25/2021 ? ? PT End of Session - 06/25/21 1345   ? ? Visit Number 61   ? Number of Visits 68   ? Date for PT Re-Evaluation 07/11/21   ? Authorization Type [employer]HC Medicare; [place] Medicaid   ? Authorization Time Period 2/10 PN 3/13   ? PT Start Time 1345   ? PT Stop Time 5400   ? PT Time Calculation (min) 44 min   ? Equipment Utilized During Treatment Gait belt   ? Activity Tolerance Patient limited by fatigue;Patient tolerated treatment well   ? Behavior During Therapy [facility] for tasks assessed/performed   ? ?  ?  ? ?  ? ? ?Past Medical History:  ?Diagnosis Date  ? GERD (gastroesophageal reflux disease)   ? Hyperlipidemia   ? Hypertension   ? Obesity   ? Stroke [facility])   ? ? ?Past Surgical History:  ?Procedure Laterality Date  ? BRAIN SURGERY    ? ? ?There were no vitals filed for this visit. ? ? Subjective Assessment - 06/25/21 1403   ? ? Subjective Patient reports he is getting a walk in shower. Still waiting to hear back about his aide being covered.   ? Pertinent History Patient is a pleasant 56 year old male who presents to physical therapy for weakness and immobility secondary to CVA.  Had a stroke in Feb 2018. He was previously fully independent, but this stroke caused severe residual deficits, mainly on the right side. He reports difficulty walking and decreased mobility. His mom had to move in with him and now is his main caregiver.  Patient had outpatient PT from November 2018-March 2022. Patient reports since being discharged from outpatient therapy he has kept up with his HEP daily. He does report desire for walking better and for getting in and out of bed easier. He reports his mom is helping  with some ADLs. He reports needing help with rolling in bed. He reports his mom/aide assist with dressing. He has a home aide 3 days out of the week;   ? Limitations Standing;Walking   ? How long can you sit comfortably? NA   ? How long can you stand comfortably? <30 min, does minimal standing;   ? How long can you walk comfortably? depends on the day, fatigue affects his walking; He is walking every day, about 15-20 min;   ? Diagnostic tests None recent   ? Patient Stated Goals to walk better and be independent in bed mobility;   ? Currently in Pain? No/denies   ? ?  ?  ? ?  ? ? ? ? ? ? ? ? ? ?  ?Treatment: ?In // bars:  ?-forward/backwards ambulation 4x length of // bars: max cueing for sequencing and safety ?-lateral stepping 4x length of // bars; seated rest break after two due toe extreme fatigue ?-4" step toe taps 10x each LE; heavy BUE support. Patient reports is hard ?-saebo ball transfer x3 minutes  ?-10x  STS heavy UE support  ? ? seated: ?Lateral step over hedgehog 10x each LE; unable to perform with RLE requires use of theraband and AAROM.  ?GTB hamstring curl 15x each LE  ?GTB row 15x  ?  ?  Pt educated throughout session about proper posture and technique with exercises. Improved exercise technique, movement at target joints, use of target muscles after min to mod verbal, visual, tactile cues. ?  ?  ?  ? ? ? ?Patient is very challenged with lateral stepping this session requiring rest breaks to perform with increased knee hyperextension. Patient is very challenged with weight shift for toe taps but is able to perform due to excellent motivation. He would benefit from additional skilled PT Intervention to improve strength, balance and mobility ? ? ? ? ? ? ? ? ? ? ? ? ? ? ? PT Education - 06/25/21 1344   ? ? Education Details exercise technique, body mechanics   ? Person(s) Educated Patient   ? Methods Explanation;Demonstration;Tactile cues;Verbal cues   ? Comprehension Verbalized understanding;Returned  demonstration;Verbal cues required;Tactile cues required   ? ?  ?  ? ?  ? ? ? PT Short Term Goals - 05/16/21 1502   ? ?  ? PT SHORT TERM GOAL #1  ? Title Patient will be adherent to HEP at least 3x a week to improve functional strength and balance for better safety at home.   ? Baseline 11/15/2020- Patient reports performing seated LE hip march, knee ext, sit to stand and walking daily and states no questions. Advised patient to practice static standing using his walker or kitchen counter several times per day without UE support.   ? Time 4   ? Period Weeks   ? Status Achieved   ? Target Date 11/01/20   ?  ? PT SHORT TERM GOAL #2  ? Title Patient will be independent in bed mobility including sit to supine utilizing bed rails as needed for increased mobility at home.   ? Baseline 6/29: requires min A 8/24: patient reports needing mod A 12/7: patient reports needing min<>mod A; 05/09/21 MOD A for BLE assist during sit>sup   ? Time 4   ? Period Weeks   ? Status On-going   ? Target Date 06/13/21   ? ?  ?  ? ?  ? ? ? ? PT Long Term Goals - 06/18/21 1425   ? ?  ? PT LONG TERM GOAL #1  ? Title Patient will improve FOTO score to > 50% to indicate improve functional mobility at home and in community.   ? Baseline 6/29: 42% 8/24: 40% 10/17: 40% 12/12: 46%; 05/09/21: 34% 3/13: 40%   ? Time 8   ? Period Weeks   ? Status Partially Met   ? Target Date 07/11/21   ?  ? PT LONG TERM GOAL #2  ? Title Patient (< 80 years old) will complete five times sit to stand test in < 20 seconds indicating an increased LE strength and improved balance.   ? Baseline 6/29: 23.8 sec with 2HHA. 11/15/2020= 48 sec with BUE Support and CGA using gait belt. 8/24: 23 seconds with 2 HHA 10/17: 19.23 seconds with BUE support 12/7: 20.21s BUE; 05/09/21: 35 seconds with BUE support and support using gait belt 3/13: 17.81 with one hand on walker one hand on chair. 48 seconds to full stand with three posterior LOB   ? Time 8   ? Period Weeks   ? Status On-going   ?  Target Date 07/11/21   ?  ? PT LONG TERM GOAL #3  ? Title Patient will increase 10 meter walk test to >0.15 m/s (60 sec or less) as to improve gait speed for better home  ambulation and to reduce fall risk.   ? Baseline 6/29: 0.08 m/s with RW. 11/15/2020= 0.093 m/s with RW 8/24: 0.15 m/s 12/7= 0.8ms with RW; 05/09/21: 0.029m w/ RW   ? Time 8   ? Period Weeks   ? Status Achieved   ? Target Date 07/11/21   ?  ? PT LONG TERM GOAL #4  ? Title Patient will increase BLE gross strength to 4+/5 as to improve functional strength for independent gait, increased standing tolerance and increased ADL ability.   ? Baseline 6/29: see flowsheet. 8/24: hips grossly 4-/5 with hip extension 2+/5; L knee flexion 2+/5 knee extension 4/5; R knee 3+/5 10/17: see note 12/7: see note; 05/09/21: see note 3/13; see note   ? Time 8   ? Period Weeks   ? Status Partially Met   ? Target Date 07/11/21   ?  ? PT LONG TERM GOAL #5  ? Title Patient will ambulate 250 ft with least assistive device and Supervision with no breaks to allow for increased mobility within home.   ? Baseline 11/15/2020-Ambulation distance = approx 110 feet using pediatric bariatric walker 8/24: 155 ft with bariatric RW 10/17: 170 ft with bariatric RW 12/7: [id number] bariatric RW, see note; 05/09/21: [id number] bariatric RW 3/13: rest breaks at 55, 70, 20 ft; total 145 ft   ? Time 8   ? Period Weeks   ? Status Partially Met   ? Target Date 07/11/21   ?  ? PT LONG TERM GOAL #6  ? Title Patient will increase 10 meter walk test to >0.5 m/s ( 30 seconds) as to improve gait speed for better home ambulation and to reduce fall risk.   ? Baseline 8/24: 61 seconds: 0.15 m/s 10/17: 64 seconds ~0.15 m/s 12/7: 0.83m/83m1.5s); 05/09/21: 0.27m/s63m3: 70 seconds: 0.14 m/s   ? Time 8   ? Period Weeks   ? Status Partially Met   ? Target Date 07/11/21   ?  ? PT LONG TERM GOAL #7  ? Title Patient (< 60 yea44 old) will complete five times sit to stand test in < 20 seconds with SUE support  indicating  an increased LE strength and improved balance.   ? Baseline 10/17: 19.23 seconds with BUE support 12/7: 20.21s with SUE on RW, SUE on chair; 05/09/21: 35 seconds   ? Time 8   ? Period Weeks   ? Status On-going

## 2021-06-27 ENCOUNTER — Ambulatory Visit: Payer: Medicare Other

## 2021-06-27 ENCOUNTER — Other Ambulatory Visit: Payer: Self-pay

## 2021-06-27 DIAGNOSIS — R262 Difficulty in walking, not elsewhere classified: Secondary | ICD-10-CM

## 2021-06-27 DIAGNOSIS — R269 Unspecified abnormalities of gait and mobility: Secondary | ICD-10-CM

## 2021-06-27 DIAGNOSIS — M6281 Muscle weakness (generalized): Secondary | ICD-10-CM

## 2021-06-27 DIAGNOSIS — R2681 Unsteadiness on feet: Secondary | ICD-10-CM

## 2021-06-27 NOTE — Therapy (Signed)
[place] ?[facility] ?[place], [place], [address] ?Phone: [phone]   Fax:  [phone] ? ?Physical Therapy Treatment ? ?Patient Details  ?Name: Curtis Powell ?MRN: [id number] ?Date of Birth: January 29, 1966 ?Referring Provider (PT): Olmedo MD ? ? ?Encounter Date: 06/27/2021 ? ? PT End of Session - 06/27/21 1250   ? ? Visit Number 3   ? Number of Visits 68   ? Date for PT Re-Evaluation 07/11/21   ? Authorization Type [employer] Medicare; [place] Medicaid   ? Authorization Time Period 3/10 PN 3/13   ? PT Start Time 1300   ? PT Stop Time 1344   ? PT Time Calculation (min) 44 min   ? Equipment Utilized During Treatment Gait belt   ? Activity Tolerance Patient limited by fatigue;Patient tolerated treatment well   ? Behavior During Therapy [facility] for tasks assessed/performed   ? ?  ?  ? ?  ? ? ?Past Medical History:  ?Diagnosis Date  ? GERD (gastroesophageal reflux disease)   ? Hyperlipidemia   ? Hypertension   ? Obesity   ? Stroke [facility])   ? ? ?Past Surgical History:  ?Procedure Laterality Date  ? BRAIN SURGERY    ? ? ?There were no vitals filed for this visit. ? ? Subjective Assessment - 06/27/21 1303   ? ? Subjective Patient reports he woke up with a R knee pain but is better now. Reports he almost fell getting out of bathroom this morning due to knee pain.  Will get a walk in shower next week.   ? Pertinent History Patient is a pleasant 56 year old male who presents to physical therapy for weakness and immobility secondary to CVA.  Had a stroke in Feb 2018. He was previously fully independent, but this stroke caused severe residual deficits, mainly on the right side. He reports difficulty walking and decreased mobility. His mom had to move in with him and now is his main caregiver.  Patient had outpatient PT from November 2018-March 2022. Patient reports since being discharged from outpatient therapy he has kept up with his HEP daily. He does report desire for walking better  and for getting in and out of bed easier. He reports his mom is helping with some ADLs. He reports needing help with rolling in bed. He reports his mom/aide assist with dressing. He has a home aide 3 days out of the week;   ? Limitations Standing;Walking   ? How long can you sit comfortably? NA   ? How long can you stand comfortably? <30 min, does minimal standing;   ? How long can you walk comfortably? depends on the day, fatigue affects his walking; He is walking every day, about 15-20 min;   ? Diagnostic tests None recent   ? Patient Stated Goals to walk better and be independent in bed mobility;   ? Currently in Pain? No/denies   ? ?  ?  ? ?  ? ? ? ? ? ? ? ? ?Treatment: ?In // bars:  ?-forward/backwards ambulation 4x length of // bars: max cueing for sequencing and safety ?-lateral stepping 4x length of // bars; seated rest break after two due toe extreme fatigue ?-4" step toe taps 10x each LE; heavy BUE support. Patient reports is hard ? ?  ?Ambulate 76 ft with RW and CGA with wheelchair follow, increased inversion of feet with rolling of ankles with fatigue. Decreased foot clearance and increased trunk flexion as patient  progressed. ? ? seated: ? ?10x  STS heavy UE support ; three near LOB posterior requiring cueing for gluteal activation ? ?Seated with # 3 ankle weights  ?-Seated marches with upright posture, back away from back of chair for abdominal/trunk activation/stabilization, 10x each LE ?-Seated LAQ with 3 second holds, 10x each LE, cueing for muscle activation and sequencing for neutral alignment ?-Seated IR/ER with cueing for stabilizing knee placement with lateral foot movement for optimal muscle recruitment, 10x each LE ?-seated heel raises 20x ? ?GTB hamstring curl 15x each LE  ?GTB row 15x  ? seated trunk extensions 12x ? ?Pt educated throughout session about proper posture and technique with exercises. Improved exercise technique, movement at target joints, use of target muscles after min to  mod verbal, visual, tactile cues. ? ? ? ?Patient tolerates progressive standing with increased standing interventions performed in one session. Patient does have increased rolling of ankles with ambulation and increased trunk flexion as he fatigues. Patient remains highly motivated despite fatigue. He would benefit from additional skilled PT Intervention to improve strength, balance and mobility ? ? ? ? ? ? ? ? ? ? ? ? ? ? ? ? ? PT Education - 06/27/21 1249   ? ? Education Details exercise technique, body mechanics   ? Person(s) Educated Patient   ? Methods Explanation;Demonstration;Tactile cues;Verbal cues   ? Comprehension Verbalized understanding;Returned demonstration;Verbal cues required;Tactile cues required   ? ?  ?  ? ?  ? ? ? PT Short Term Goals - 05/16/21 1502   ? ?  ? PT SHORT TERM GOAL #1  ? Title Patient will be adherent to HEP at least 3x a week to improve functional strength and balance for better safety at home.   ? Baseline 11/15/2020- Patient reports performing seated LE hip march, knee ext, sit to stand and walking daily and states no questions. Advised patient to practice static standing using his walker or kitchen counter several times per day without UE support.   ? Time 4   ? Period Weeks   ? Status Achieved   ? Target Date 11/01/20   ?  ? PT SHORT TERM GOAL #2  ? Title Patient will be independent in bed mobility including sit to supine utilizing bed rails as needed for increased mobility at home.   ? Baseline 6/29: requires min A 8/24: patient reports needing mod A 12/7: patient reports needing min<>mod A; 05/09/21 MOD A for BLE assist during sit>sup   ? Time 4   ? Period Weeks   ? Status On-going   ? Target Date 06/13/21   ? ?  ?  ? ?  ? ? ? ? PT Long Term Goals - 06/18/21 1425   ? ?  ? PT LONG TERM GOAL #1  ? Title Patient will improve FOTO score to > 50% to indicate improve functional mobility at home and in community.   ? Baseline 6/29: 42% 8/24: 40% 10/17: 40% 12/12: 46%; 05/09/21: 34% 3/13:  40%   ? Time 8   ? Period Weeks   ? Status Partially Met   ? Target Date 07/11/21   ?  ? PT LONG TERM GOAL #2  ? Title Patient (< 45 years old) will complete five times sit to stand test in < 20 seconds indicating an increased LE strength and improved balance.   ? Baseline 6/29: 23.8 sec with 2HHA. 11/15/2020= 48 sec with BUE Support and CGA using gait belt. 8/24: 23 seconds with  2 HHA 10/17: 19.23 seconds with BUE support 12/7: 20.21s BUE; 05/09/21: 35 seconds with BUE support and support using gait belt 3/13: 17.81 with one hand on walker one hand on chair. 48 seconds to full stand with three posterior LOB   ? Time 8   ? Period Weeks   ? Status On-going   ? Target Date 07/11/21   ?  ? PT LONG TERM GOAL #3  ? Title Patient will increase 10 meter walk test to >0.15 m/s (60 sec or less) as to improve gait speed for better home ambulation and to reduce fall risk.   ? Baseline 6/29: 0.08 m/s with RW. 11/15/2020= 0.093 m/s with RW 8/24: 0.15 m/s 12/7= 0.15ms with RW; 05/09/21: 0.060m w/ RW   ? Time 8   ? Period Weeks   ? Status Achieved   ? Target Date 07/11/21   ?  ? PT LONG TERM GOAL #4  ? Title Patient will increase BLE gross strength to 4+/5 as to improve functional strength for independent gait, increased standing tolerance and increased ADL ability.   ? Baseline 6/29: see flowsheet. 8/24: hips grossly 4-/5 with hip extension 2+/5; L knee flexion 2+/5 knee extension 4/5; R knee 3+/5 10/17: see note 12/7: see note; 05/09/21: see note 3/13; see note   ? Time 8   ? Period Weeks   ? Status Partially Met   ? Target Date 07/11/21   ?  ? PT LONG TERM GOAL #5  ? Title Patient will ambulate 250 ft with least assistive device and Supervision with no breaks to allow for increased mobility within home.   ? Baseline 11/15/2020-Ambulation distance = approx 110 feet using pediatric bariatric walker 8/24: 155 ft with bariatric RW 10/17: 170 ft with bariatric RW 12/7: [id number] bariatric RW, see note; 05/09/21: [id number] bariatric RW 3/13:  rest breaks at 55, 70, 20 ft; total 145 ft   ? Time 8   ? Period Weeks   ? Status Partially Met   ? Target Date 07/11/21   ?  ? PT LONG TERM GOAL #6  ? Title Patient will increase 10 meter walk test t

## 2021-07-02 ENCOUNTER — Ambulatory Visit: Payer: Commercial Managed Care - HMO

## 2021-07-02 ENCOUNTER — Ambulatory Visit: Payer: Medicare Other

## 2021-07-04 ENCOUNTER — Ambulatory Visit: Payer: Commercial Managed Care - HMO

## 2021-07-04 ENCOUNTER — Ambulatory Visit: Payer: Medicare Other

## 2021-07-09 ENCOUNTER — Ambulatory Visit: Payer: Medicare Other

## 2021-07-09 ENCOUNTER — Ambulatory Visit: Payer: Medicare Other | Attending: Family Medicine

## 2021-07-09 DIAGNOSIS — R278 Other lack of coordination: Secondary | ICD-10-CM | POA: Diagnosis present

## 2021-07-09 DIAGNOSIS — R2681 Unsteadiness on feet: Secondary | ICD-10-CM | POA: Diagnosis present

## 2021-07-09 DIAGNOSIS — M6281 Muscle weakness (generalized): Secondary | ICD-10-CM | POA: Diagnosis present

## 2021-07-09 DIAGNOSIS — R2689 Other abnormalities of gait and mobility: Secondary | ICD-10-CM | POA: Diagnosis present

## 2021-07-09 DIAGNOSIS — R269 Unspecified abnormalities of gait and mobility: Secondary | ICD-10-CM

## 2021-07-09 DIAGNOSIS — R296 Repeated falls: Secondary | ICD-10-CM | POA: Insufficient documentation

## 2021-07-09 DIAGNOSIS — R262 Difficulty in walking, not elsewhere classified: Secondary | ICD-10-CM | POA: Diagnosis present

## 2021-07-09 NOTE — Therapy (Signed)
[place] [facility] ?[place], [place], [address] ?Phone: [phone]   Fax:  [phone] ? ?Physical Therapy Treatment ? ?Patient Details  ?Name: Curtis Powell ?MRN: [id number] ?Date of Birth: July 20, 1965 ?Referring Provider (PT): Olmedo MD ? ? ?Encounter Date: 07/09/2021 ? ? PT End of Session - 07/09/21 1416   ? ? Visit Number [id number]   ? Number of Visits 68   ? Date for PT Re-Evaluation 07/11/21   ? Authorization Type UHC Medicare;  Medicaid   ? Authorization Time Period 4/10 PN 3/13   ? PT Start Time 1345   ? PT Stop Time 1430   ? PT Time Calculation (min) 45 min   ? Equipment Utilized During Treatment Gait belt   ? Activity Tolerance Patient limited by fatigue;Patient tolerated treatment well   ? Behavior During Therapy [facility] for tasks assessed/performed   ? ?  ?  ? ?  ? ? ?Past Medical History:  ?Diagnosis Date  ? GERD (gastroesophageal reflux disease)   ? Hyperlipidemia   ? Hypertension   ? Obesity   ? Stroke [facility])   ? ? ?Past Surgical History:  ?Procedure Laterality Date  ? BRAIN SURGERY    ? ? ?There were no vitals filed for this visit. ? ? Subjective Assessment - 07/09/21 1358   ? ? Subjective Patient returning after a week absence from PT. Patient reports no falls but has been doing his HEP. His contrator for the walk in shower never showed up.   ? Pertinent History Patient is a pleasant 56 year old male who presents to physical therapy for weakness and immobility secondary to CVA.  Had a stroke in Feb 2018. He was previously fully independent, but this stroke caused severe residual deficits, mainly on the right side. He reports difficulty walking and decreased mobility. His mom had to move in with him and now is his main caregiver.  Patient had outpatient PT from November 2018-March 2022. Patient reports since being discharged from outpatient therapy he has kept up with his HEP daily. He does report desire for walking better and for getting in and out  of bed easier. He reports his mom is helping with some ADLs. He reports needing help with rolling in bed. He reports his mom/aide assist with dressing. He has a home aide 3 days out of the week;   ? Limitations Standing;Walking   ? How long can you sit comfortably? NA   ? How long can you stand comfortably? <30 min, does minimal standing;   ? How long can you walk comfortably? depends on the day, fatigue affects his walking; He is walking every day, about 15-20 min;   ? Diagnostic tests None recent   ? Patient Stated Goals to walk better and be independent in bed mobility;   ? Currently in Pain? No/denies   ? ?  ?  ? ?  ? ? ? ? ? ? ? ? ?  ?  ?  ?Treatment: ?In // bars:  ?-forward/backwards ambulation 4x length of // bars: max cueing for sequencing and safety ?-lateral stepping 4x length of // bars; seated rest break after two due toe extreme fatigue ?-4" step toe taps 10x each LE; heavy BUE support. Patient reports is hard ?-standing sword fight with SUE support for pertubation, reaching, coordination, and ability to retain COM 2 minutes x 2 trials   ? ?  ?seated: ?  ?10x  STS heavy UE support ;  three near LOB posterior requiring cueing for gluteal activation ?  ?Seated with # 4 ankle weights  ?-Seated marches with upright posture, back away from back of chair for abdominal/trunk activation/stabilization, 15x each LE ?-Seated LAQ with 3 second holds, 15x each LE, cueing for muscle activation and sequencing for neutral alignment ?-Seated IR/ER with cueing for stabilizing knee placement with lateral foot movement for optimal muscle recruitment, 15x each LE ?-seated heel raises 20x ?  ?GTB hamstring curl 15x each LE  ?GTB row 15x  ? seated trunk extensions 12x ?  ?Pt educated throughout session about proper posture and technique with exercises. Improved exercise technique, movement at target joints, use of target muscles after min to mod verbal, visual, tactile cues. ?  ? ? ? ? ?HR monitored with rest breaks applied to  ensure therapeutic range throughout session.  ? ? ? ?Patient returning to PT after week absence. Patient is aware that he will be doing his testing next session for assessment day. Patient is highly  motivated despite fatigue and requires occasional rest breaks to return HR to therapeutic range. He would benefit from additional skilled PT Intervention to improve strength, balance and mobility ? ? ? ? ? ? ? ? ? ? ? PT Education - 07/09/21 1400   ? ? Education Details exercise technique, body mechanics   ? Person(s) Educated Patient   ? Methods Explanation;Demonstration;Tactile cues;Verbal cues   ? Comprehension Verbalized understanding;Returned demonstration;Verbal cues required;Tactile cues required   ? ?  ?  ? ?  ? ? ? PT Short Term Goals - 05/16/21 1502   ? ?  ? PT SHORT TERM GOAL #1  ? Title Patient will be adherent to HEP at least 3x a week to improve functional strength and balance for better safety at home.   ? Baseline 11/15/2020- Patient reports performing seated LE hip march, knee ext, sit to stand and walking daily and states no questions. Advised patient to practice static standing using his walker or kitchen counter several times per day without UE support.   ? Time 4   ? Period Weeks   ? Status Achieved   ? Target Date 11/01/20   ?  ? PT SHORT TERM GOAL #2  ? Title Patient will be independent in bed mobility including sit to supine utilizing bed rails as needed for increased mobility at home.   ? Baseline 6/29: requires min A 8/24: patient reports needing mod A 12/7: patient reports needing min<>mod A; 05/09/21 MOD A for BLE assist during sit>sup   ? Time 4   ? Period Weeks   ? Status On-going   ? Target Date 06/13/21   ? ?  ?  ? ?  ? ? ? ? PT Long Term Goals - 06/18/21 1425   ? ?  ? PT LONG TERM GOAL #1  ? Title Patient will improve FOTO score to > 50% to indicate improve functional mobility at home and in community.   ? Baseline 6/29: 42% 8/24: 40% 10/17: 40% 12/12: 46%; 05/09/21: 34% 3/13: 40%   ? Time 8    ? Period Weeks   ? Status Partially Met   ? Target Date 07/11/21   ?  ? PT LONG TERM GOAL #2  ? Title Patient (< 41 years old) will complete five times sit to stand test in < 20 seconds indicating an increased LE strength and improved balance.   ? Baseline 6/29: 23.8 sec with 2HHA. 11/15/2020= 48 sec with BUE  Support and CGA using gait belt. 8/24: 23 seconds with 2 HHA 10/17: 19.23 seconds with BUE support 12/7: 20.21s BUE; 05/09/21: 35 seconds with BUE support and support using gait belt 3/13: 17.81 with one hand on walker one hand on chair. 48 seconds to full stand with three posterior LOB   ? Time 8   ? Period Weeks   ? Status On-going   ? Target Date 07/11/21   ?  ? PT LONG TERM GOAL #3  ? Title Patient will increase 10 meter walk test to >0.15 m/s (60 sec or less) as to improve gait speed for better home ambulation and to reduce fall risk.   ? Baseline 6/29: 0.08 m/s with RW. 11/15/2020= 0.093 m/s with RW 8/24: 0.15 m/s 12/7= 0.7ms with RW; 05/09/21: 0.059m w/ RW   ? Time 8   ? Period Weeks   ? Status Achieved   ? Target Date 07/11/21   ?  ? PT LONG TERM GOAL #4  ? Title Patient will increase BLE gross strength to 4+/5 as to improve functional strength for independent gait, increased standing tolerance and increased ADL ability.   ? Baseline 6/29: see flowsheet. 8/24: hips grossly 4-/5 with hip extension 2+/5; L knee flexion 2+/5 knee extension 4/5; R knee 3+/5 10/17: see note 12/7: see note; 05/09/21: see note 3/13; see note   ? Time 8   ? Period Weeks   ? Status Partially Met   ? Target Date 07/11/21   ?  ? PT LONG TERM GOAL #5  ? Title Patient will ambulate 250 ft with least assistive device and Supervision with no breaks to allow for increased mobility within home.   ? Baseline 11/15/2020-Ambulation distance = approx 110 feet using pediatric bariatric walker 8/24: 155 ft with bariatric RW 10/17: 170 ft with bariatric RW 12/7: [id number] bariatric RW, see note; 05/09/21: [id number] bariatric RW 3/13: rest breaks at  55, 70, 20 ft; total 145 ft   ? Time 8   ? Period Weeks   ? Status Partially Met   ? Target Date 07/11/21   ?  ? PT LONG TERM GOAL #6  ? Title Patient will increase 10 meter walk test to >0.5 m/s (

## 2021-07-11 ENCOUNTER — Ambulatory Visit: Payer: Medicare Other

## 2021-07-11 DIAGNOSIS — R269 Unspecified abnormalities of gait and mobility: Secondary | ICD-10-CM | POA: Diagnosis not present

## 2021-07-11 DIAGNOSIS — M6281 Muscle weakness (generalized): Secondary | ICD-10-CM

## 2021-07-11 DIAGNOSIS — R262 Difficulty in walking, not elsewhere classified: Secondary | ICD-10-CM

## 2021-07-11 NOTE — Therapy (Signed)
[place] [facility] ?[place], [place], [address] ?Phone: [phone]   Fax:  [phone] ? ?Physical Therapy Treatment/RECERT ? ?Patient Details  ?Name: Curtis Powell ?MRN: [id number] ?Date of Birth: 07-12-1965 ?Referring Provider (PT): Olmedo MD ? ? ?Encounter Date: 07/11/2021 ? ? PT End of Session - 07/11/21 1246   ? ? Visit Number [id number]   ? Number of Visits 81   ? Date for PT Re-Evaluation 09/05/21   ? Authorization Type [employer] Medicare; [place] Medicaid   ? Authorization Time Period 5/10 PN 3/13   ? PT Start Time 1300   ? PT Stop Time 6811   ? PT Time Calculation (min) 43 min   ? Equipment Utilized During Treatment Gait belt   ? Activity Tolerance Patient limited by fatigue;Patient tolerated treatment well   ? Behavior During Therapy [facility] for tasks assessed/performed   ? ?  ?  ? ?  ? ? ?Past Medical History:  ?Diagnosis Date  ? GERD (gastroesophageal reflux disease)   ? Hyperlipidemia   ? Hypertension   ? Obesity   ? Stroke [facility])   ? ? ?Past Surgical History:  ?Procedure Laterality Date  ? BRAIN SURGERY    ? ? ?There were no vitals filed for this visit. ? ? Subjective Assessment - 07/11/21 1335   ? ? Subjective Patient reports doing some exercises every day. No falls or LOB since last session.   ? Pertinent History Patient is a pleasant 56 year old male who presents to physical therapy for weakness and immobility secondary to CVA.  Had a stroke in Feb 2018. He was previously fully independent, but this stroke caused severe residual deficits, mainly on the right side. He reports difficulty walking and decreased mobility. His mom had to move in with him and now is his main caregiver.  Patient had outpatient PT from November 2018-March 2022. Patient reports since being discharged from outpatient therapy he has kept up with his HEP daily. He does report desire for walking better and for getting in and out of bed easier. He reports his mom is helping with some ADLs. He  reports needing help with rolling in bed. He reports his mom/aide assist with dressing. He has a home aide 3 days out of the week;   ? Limitations Standing;Walking   ? How long can you sit comfortably? NA   ? How long can you stand comfortably? <30 min, does minimal standing;   ? How long can you walk comfortably? depends on the day, fatigue affects his walking; He is walking every day, about 15-20 min;   ? Diagnostic tests None recent   ? Patient Stated Goals to walk better and be independent in bed mobility;   ? Currently in Pain? No/denies   ? ?  ?  ? ?  ? ? ? ? ? ? ? ?Recert ? ?Bed mobility: needs assistance for bed mobility  ?FOTO: 34%  ?5x STS: 66 seconds with heavy BUE support  ?BLE strength ? -Hips 4/5 hamstring 3/5; quads 4-/5 ?Ambulate 250 ft : 100 ft with bRW  ?10 MWT 1 min 13 seconds=0.13 m/s ? ? ? ?Seated with # 5lb ankle weights  ?-Seated marches with upright posture, back away from back of chair for abdominal/trunk activation/stabilization, 10x each LE ?-Seated LAQ with 3 second holds, 10x each LE, cueing for muscle activation and sequencing for neutral alignment ?-Seated IR/ER with cueing for stabilizing knee placement with lateral foot movement  for optimal muscle recruitment, 10x each LE ?-20 heel raises  ? ? ? ?Patient's goals performed with patient having decreased score indicating regression. Patient educated that he will require progress to continue with therapy. Will grant patient a trial period in which he must demonstrate progress in order to continue therapy. Patient verbalized understanding, sister notified.  ? ? ? ? ? ? ? ? ? ? ? ? ? ? ? ? ? PT Education - 07/11/21 1245   ? ? Education Details goals, POC   ? Person(s) Educated Patient   ? Methods Explanation;Demonstration;Tactile cues;Verbal cues   ? Comprehension Verbalized understanding;Returned demonstration;Verbal cues required;Tactile cues required   ? ?  ?  ? ?  ? ? ? PT Short Term Goals - 07/11/21 1314   ? ?  ? PT SHORT TERM GOAL #1   ? Title Patient will be adherent to HEP at least 3x a week to improve functional strength and balance for better safety at home.   ? Baseline 11/15/2020- Patient reports performing seated LE hip march, knee ext, sit to stand and walking daily and states no questions. Advised patient to practice static standing using his walker or kitchen counter several times per day without UE support.   ? Time 4   ? Period Weeks   ? Status Achieved   ? Target Date 11/01/20   ?  ? PT SHORT TERM GOAL #2  ? Title Patient will be independent in bed mobility including sit to supine utilizing bed rails as needed for increased mobility at home.   ? Baseline 6/29: requires min A 8/24: patient reports needing mod A 12/7: patient reports needing min<>mod A; 05/09/21 MOD A for BLE assist during sit>sup 4/5: min-mod A   ? Time 4   ? Period Weeks   ? Status On-going   ? Target Date 08/08/21   ? ?  ?  ? ?  ? ? ? ? PT Long Term Goals - 07/11/21 1314   ? ?  ? PT LONG TERM GOAL #1  ? Title Patient will improve FOTO score to > 50% to indicate improve functional mobility at home and in community.   ? Baseline 6/29: 42% 8/24: 40% 10/17: 40% 12/12: 46%; 05/09/21: 34% 3/13: 40% 4/5: 34%   ? Time 8   ? Period Weeks   ? Status On-going   ? Target Date 09/05/21   ?  ? PT LONG TERM GOAL #2  ? Title Patient (< 74 years old) will complete five times sit to stand test in < 20 seconds indicating an increased LE strength and improved balance.   ? Baseline 6/29: 23.8 sec with 2HHA. 11/15/2020= 48 sec with BUE Support and CGA using gait belt. 8/24: 23 seconds with 2 HHA 10/17: 19.23 seconds with BUE support 12/7: 20.21s BUE; 05/09/21: 35 seconds with BUE support and support using gait belt 3/13: 17.81 with one hand on walker one hand on chair. 48 seconds to full stand with three posterior LOB   ? Time 8   ? Period Weeks   ? Status Achieved   ? Target Date 07/11/21   ?  ? PT LONG TERM GOAL #3  ? Title Patient will increase 10 meter walk test to >0.15 m/s (60 sec or less)  as to improve gait speed for better home ambulation and to reduce fall risk.   ? Baseline 6/29: 0.08 m/s with RW. 11/15/2020= 0.093 m/s with RW 8/24: 0.15 m/s 12/7= 0.47ms with RW; 05/09/21:  0.52ms w/ RW   ? Time 8   ? Period Weeks   ? Status Achieved   ? Target Date 07/11/21   ?  ? PT LONG TERM GOAL #4  ? Title Patient will increase BLE gross strength to 4+/5 as to improve functional strength for independent gait, increased standing tolerance and increased ADL ability.   ? Baseline 6/29: see flowsheet. 8/24: hips grossly 4-/5 with hip extension 2+/5; L knee flexion 2+/5 knee extension 4/5; R knee 3+/5 10/17: see note 12/7: see note; 05/09/21: see note 3/13; see note 4/5: Hips 4/5 hamstring 3/5; quads 4-/5   ? Time 8   ? Period Weeks   ? Status Partially Met   ? Target Date 09/05/21   ?  ? PT LONG TERM GOAL #5  ? Title Patient will ambulate 250 ft with least assistive device and Supervision with no breaks to allow for increased mobility within home.   ? Baseline 11/15/2020-Ambulation distance = approx 110 feet using pediatric bariatric walker 8/24: 155 ft with bariatric RW 10/17: 170 ft with bariatric RW 12/7: 1745fwith bariatric RW, see note; 05/09/21: 8332fith bariatric RW 3/13: rest breaks at 55, 70, 20 ft; total 145 ft 4/5: 100 ft   ? Time 8   ? Period Weeks   ? Status On-going   ? Target Date 09/05/21   ?  ? PT LONG TERM GOAL #6  ? Title Patient will increase 10 meter walk test to >0.5 m/s ( 30 seconds) as to improve gait speed for better home ambulation and to reduce fall risk.   ? Baseline 8/24: 61 seconds: 0.15 m/s 10/17: 64 seconds ~0.15 m/s 12/7: 0.22m80m51.5s); 05/09/21: 0.50m/80m13: 70 seconds: 0.14 m/s  4/5: 1 min 13 seconds with 0.13 m/s   ? Time 8   ? Period Weeks   ? Status On-going   ? Target Date 09/05/21   ?  ? PT LONG TERM GOAL #7  ? Title Patient (< 60 ye24s old) will complete five times sit to stand test in < 20 seconds with SUE support  indicating an increased LE strength and improved balance.   ?  Baseline 10/17: 19.23 seconds with BUE support 12/7: 20.21s with SUE on RW, SUE on chair; 05/09/21: 35 seconds 4/5: 66 seconds   ? Time 8   ? Period Weeks   ? Status On-going   ? Target Date 05/31/2

## 2021-07-16 ENCOUNTER — Ambulatory Visit: Payer: Medicare Other

## 2021-07-16 DIAGNOSIS — R262 Difficulty in walking, not elsewhere classified: Secondary | ICD-10-CM

## 2021-07-16 DIAGNOSIS — R269 Unspecified abnormalities of gait and mobility: Secondary | ICD-10-CM

## 2021-07-16 DIAGNOSIS — M6281 Muscle weakness (generalized): Secondary | ICD-10-CM

## 2021-07-16 DIAGNOSIS — R2681 Unsteadiness on feet: Secondary | ICD-10-CM

## 2021-07-16 NOTE — Therapy (Signed)
[place] [facility] ?[place], [place], [address] ?Phone: [phone]   Fax:  [phone] ? ?Physical Therapy Treatment ? ?Patient Details  ?Name: Curtis Powell ?MRN: [id number] ?Date of Birth: [birth year] ?Referring Provider (PT): Olmedo MD ? ? ?Encounter Date: 07/16/2021 ? ? PT End of Session - 07/16/21 1403   ? ? Visit Number [id number]   ? Number of Visits 81   ? Date for PT Re-Evaluation 09/05/21   ? Authorization Type UHC Medicare; [place] Medicaid   ? Authorization Time Period 6/10 PN 3/13   ? PT Start Time 1345   ? PT Stop Time [id number]   ? PT Time Calculation (min) 44 min   ? Equipment Utilized During Treatment Gait belt   ? Activity Tolerance Patient limited by fatigue;Patient tolerated treatment well   ? Behavior During Therapy [facility] for tasks assessed/performed   ? ?  ?  ? ?  ? ? ?Past Medical History:  ?Diagnosis Date  ? GERD (gastroesophageal reflux disease)   ? Hyperlipidemia   ? Hypertension   ? Obesity   ? Stroke [facility])   ? ? ?Past Surgical History:  ?Procedure Laterality Date  ? BRAIN SURGERY    ? ? ?There were no vitals filed for this visit. ? ? Subjective Assessment - 07/16/21 1402   ? ? Subjective Patient reports his legs have been feeling heavy. Has been doing his HEP.   ? Pertinent History Patient is a pleasant 56 year old male who presents to physical therapy for weakness and immobility secondary to CVA.  Had a stroke in Feb 2018. He was previously fully independent, but this stroke caused severe residual deficits, mainly on the right side. He reports difficulty walking and decreased mobility. His mom had to move in with him and now is his main caregiver.  Patient had outpatient PT from November 2018-March 2022. Patient reports since being discharged from outpatient therapy he has kept up with his HEP daily. He does report desire for walking better and for getting in and out of bed easier. He reports his mom is helping with some ADLs. He reports needing  help with rolling in bed. He reports his mom/aide assist with dressing. He has a home aide 3 days out of the week;   ? Limitations Standing;Walking   ? How long can you sit comfortably? NA   ? How long can you stand comfortably? <30 min, does minimal standing;   ? How long can you walk comfortably? depends on the day, fatigue affects his walking; He is walking every day, about 15-20 min;   ? Diagnostic tests None recent   ? Patient Stated Goals to walk better and be independent in bed mobility;   ? Currently in Pain? No/denies   ? ?  ?  ? ?  ? ? ? ? ? ?Standing:  ?Ambulate 68 ft with bRW; frequent near posterior LOB and poor foot clearance, patient reports legs feel very heavy. Close w/c follow required with min A/CGA ? ?BP after walk: 121/62  ? ?Standing with RW: ?-Static stand with w/c behind patient, RW in front of patient 30 seconds x 2 trials.  ?-march 10x each LE; decreasing clearance with repetition  ? ?seated: ?  ?10x  STS heavy UE support ; three near LOB posterior requiring cueing for gluteal activation ?  ?GTB abductor 20x cues for muscle activation.  ?GTB hip flexion 20x into band single LE at a time.  ?GTB  hamstring curl 15x each LE  ?GTB row 15x  ? seated trunk extensions 12x ?Heel toe raises 20x ?RTB alternating LAQ 15X each LE ?RTB lateral step outs 15x each LE  ?  ?Pt educated throughout session about proper posture and technique with exercises. Improved exercise technique, movement at target joints, use of target muscles after min to mod verbal, visual, tactile cues. ? ? ? ?Patient has decreased mobility again this session with poor foot clearance with ambulation. Frequent posterior LOB noted during session with patient being a high fall risk. [name] called and discussed POC after last session due to patient regression. [name] is aware of trial period. He would benefit from additional skilled PT Intervention to improve strength, balance and mobility ? ? ? ? ? ? ? ? ? ? ? ? ? ? ?  PT Education - 07/16/21 1402   ? ? Education Details exercise technique, body mechanics   ? Person(s) Educated Patient   ? Methods Explanation;Demonstration;Verbal cues;Tactile cues   ? Comprehension Verbalized understanding;Returned demonstration;Verbal cues required;Tactile cues required   ? ?  ?  ? ?  ? ? ? PT Short Term Goals - 07/11/21 1314   ? ?  ? PT SHORT TERM GOAL #1  ? Title Patient will be adherent to HEP at least 3x a week to improve functional strength and balance for better safety at home.   ? Baseline 11/15/2020- Patient reports performing seated LE hip march, knee ext, sit to stand and walking daily and states no questions. Advised patient to practice static standing using his walker or kitchen counter several times per day without UE support.   ? Time 4   ? Period Weeks   ? Status Achieved   ? Target Date 11/01/20   ?  ? PT SHORT TERM GOAL #2  ? Title Patient will be independent in bed mobility including sit to supine utilizing bed rails as needed for increased mobility at home.   ? Baseline 6/29: requires min A 8/24: patient reports needing mod A 12/7: patient reports needing min<>mod A; 05/09/21 MOD A for BLE assist during sit>sup 4/5: min-mod A   ? Time 4   ? Period Weeks   ? Status On-going   ? Target Date 08/08/21   ? ?  ?  ? ?  ? ? ? ? PT Long Term Goals - 07/11/21 1314   ? ?  ? PT LONG TERM GOAL #1  ? Title Patient will improve FOTO score to > 50% to indicate improve functional mobility at home and in community.   ? Baseline 6/29: 42% 8/24: 40% 10/17: 40% 12/12: 46%; 05/09/21: 34% 3/13: 40% 4/5: 34%   ? Time 8   ? Period Weeks   ? Status On-going   ? Target Date 09/05/21   ?  ? PT LONG TERM GOAL #2  ? Title Patient (< 28 years old) will complete five times sit to stand test in < 20 seconds indicating an increased LE strength and improved balance.   ? Baseline 6/29: 23.8 sec with 2HHA. 11/15/2020= 48 sec with BUE Support and CGA using gait belt. 8/24: 23 seconds with 2 HHA 10/17: 19.23 seconds with  BUE support 12/7: 20.21s BUE; 05/09/21: 35 seconds with BUE support and support using gait belt 3/13: 17.81 with one hand on walker one hand on chair. 48 seconds to full stand with three posterior LOB   ? Time 8   ? Period Weeks   ? Status  Achieved   ? Target Date 07/11/21   ?  ? PT LONG TERM GOAL #3  ? Title Patient will increase 10 meter walk test to >0.15 m/s (60 sec or less) as to improve gait speed for better home ambulation and to reduce fall risk.   ? Baseline 6/29: 0.08 m/s with RW. 11/15/2020= 0.093 m/s with RW 8/24: 0.15 m/s 12/7= 0.75ms with RW; 05/09/21: 0.076m w/ RW   ? Time 8   ? Period Weeks   ? Status Achieved   ? Target Date 07/11/21   ?  ? PT LONG TERM GOAL #4  ? Title Patient will increase BLE gross strength to 4+/5 as to improve functional strength for independent gait, increased standing tolerance and increased ADL ability.   ? Baseline 6/29: see flowsheet. 8/24: hips grossly 4-/5 with hip extension 2+/5; L knee flexion 2+/5 knee extension 4/5; R knee 3+/5 10/17: see note 12/7: see note; 05/09/21: see note 3/13; see note 4/5: Hips 4/5 hamstring 3/5; quads 4-/5   ? Time 8   ? Period Weeks   ? Status Partially Met   ? Target Date 09/05/21   ?  ? PT LONG TERM GOAL #5  ? Title Patient will ambulate 250 ft with least assistive device and Supervision with no breaks to allow for increased mobility within home.   ? Baseline 11/15/2020-Ambulation distance = approx 110 feet using pediatric bariatric walker 8/24: 155 ft with bariatric RW 10/17: 170 ft with bariatric RW 12/7: [id number] bariatric RW, see note; 05/09/21: [id number] bariatric RW 3/13: rest breaks at 55, 70, 20 ft; total 145 ft 4/5: 100 ft   ? Time 8   ? Period Weeks   ? Status On-going   ? Target Date 09/05/21   ?  ? PT LONG TERM GOAL #6  ? Title Patient will increase 10 meter walk test to >0.5 m/s ( 30 seconds) as to improve gait speed for better home ambulation and to reduce fall risk.   ? Baseline 8/24: 61 seconds: 0.15 m/s 10/17: 64 seconds ~0.15  m/s 12/7: 0.17m/84m1.5s); 05/09/21: 0.26m/s2m3: 70 seconds: 0.14 m/s  4/5: 1 min 13 seconds with 0.13 m/s   ? Time 8   ? Period Weeks   ? Status On-going   ? Target Date 09/05/21   ?  ? PT LONG TER

## 2021-07-18 ENCOUNTER — Ambulatory Visit: Payer: Medicare Other

## 2021-07-18 ENCOUNTER — Encounter: Payer: Self-pay | Admitting: Physical Therapy

## 2021-07-18 ENCOUNTER — Ambulatory Visit: Payer: Medicare Other | Admitting: Physical Therapy

## 2021-07-18 DIAGNOSIS — R2689 Other abnormalities of gait and mobility: Secondary | ICD-10-CM

## 2021-07-18 DIAGNOSIS — M6281 Muscle weakness (generalized): Secondary | ICD-10-CM

## 2021-07-18 DIAGNOSIS — R269 Unspecified abnormalities of gait and mobility: Secondary | ICD-10-CM | POA: Diagnosis not present

## 2021-07-18 DIAGNOSIS — R2681 Unsteadiness on feet: Secondary | ICD-10-CM

## 2021-07-18 DIAGNOSIS — R278 Other lack of coordination: Secondary | ICD-10-CM

## 2021-07-18 DIAGNOSIS — R262 Difficulty in walking, not elsewhere classified: Secondary | ICD-10-CM

## 2021-07-18 DIAGNOSIS — R296 Repeated falls: Secondary | ICD-10-CM

## 2021-07-18 NOTE — Therapy (Signed)
[place] [facility] ?[place], [place], [address] ?Phone: [phone]   Fax:  [phone] ? ?Physical Therapy Treatment ? ?Patient Details  ?Name: Curtis Powell ?MRN: [id number] ?Date of Birth: Dec 23, 1965 ?Referring Provider (PT): Olmedo MD ? ? ?Encounter Date: 07/18/2021 ? ? PT End of Session - 07/18/21 1316   ? ? Visit Number [id number]   ? Number of Visits 81   ? Date for PT Re-Evaluation 09/05/21   ? Authorization Type UHC Medicare; [place] Medicaid   ? Authorization Time Period 6/10 PN 3/13   ? PT Start Time 1310   ? PT Stop Time [id number]   ? PT Time Calculation (min) 35 min   ? Equipment Utilized During Treatment Gait belt   ? Activity Tolerance Patient limited by fatigue;Patient tolerated treatment well   ? Behavior During Therapy [facility] for tasks assessed/performed   ? ?  ?  ? ?  ? ? ?Past Medical History:  ?Diagnosis Date  ? GERD (gastroesophageal reflux disease)   ? Hyperlipidemia   ? Hypertension   ? Obesity   ? Stroke [facility])   ? ? ?Past Surgical History:  ?Procedure Laterality Date  ? BRAIN SURGERY    ? ? ?There were no vitals filed for this visit. ? ? Subjective Assessment - 07/18/21 1315   ? ? Subjective Patient reports working on his walking and exercise at home. he denies any falls. Reports his legs still feel kind of heavy;   ? Pertinent History Patient is a pleasant 56 year old male who presents to physical therapy for weakness and immobility secondary to CVA.  Had a stroke in Feb 2018. He was previously fully independent, but this stroke caused severe residual deficits, mainly on the right side. He reports difficulty walking and decreased mobility. His mom had to move in with him and now is his main caregiver.  Patient had outpatient PT from November 2018-March 2022. Patient reports since being discharged from outpatient therapy he has kept up with his HEP daily. He does report desire for walking better and for getting in and out of bed easier. He reports  his mom is helping with some ADLs. He reports needing help with rolling in bed. He reports his mom/aide assist with dressing. He has a home aide 3 days out of the week;   ? Limitations Standing;Walking   ? How long can you sit comfortably? NA   ? How long can you stand comfortably? <30 min, does minimal standing;   ? How long can you walk comfortably? depends on the day, fatigue affects his walking; He is walking every day, about 15-20 min;   ? Diagnostic tests None recent   ? Patient Stated Goals to walk better and be independent in bed mobility;   ? Currently in Pain? No/denies   ? ?  ?  ? ?  ? ? ? ? ? ? ?  ?Standing:  ?Ambulate 55 ft with bRW; 2-3 near posterior LOB and poor foot clearance, patient reports legs feel heavy. Close w/c follow required with min A/CGA ?  ?seated: ?10x  STS heavy UE support ; three near LOB posterior requiring cueing for gluteal activation, min A to avoid loss of balance;  ?  ? ?GTB hip flexion 20x into band single LE at a time.  ?GTB hamstring curl 20x each LE, decreased ROM on RLE noted due to weakness; moderate difficulty reported;  ?GTB row 15x  ? seated trunk rotation,  reaching across body outside base of support x10 reps each direction, unsupported on back of chair;  ?  ?Pt educated throughout session about proper posture and technique with exercises. Improved exercise technique, movement at target joints, use of target muscles after min to mod verbal, visual, tactile cues. ?  ?  ? Patient exhibits increased weakness in LE especially on RLE. He exhibits increased foot drag with increased spasm in RLE foot/ankle. He exhibits increased posterior loss of balance with decreased gluteal activation in standing.  ?  ?  ?  ?  ? ? ? ? ? ? ? ? ? ? ? ? ? ? ? ? ? ? ? ? ? ? PT Education - 07/18/21 1316   ? ? Education Details exercise technique/body mechanics;   ? Person(s) Educated Patient   ? Methods Explanation;Verbal cues   ? Comprehension Verbalized understanding;Returned  demonstration;Verbal cues required;Need further instruction   ? ?  ?  ? ?  ? ? ? PT Short Term Goals - 07/11/21 1314   ? ?  ? PT SHORT TERM GOAL #1  ? Title Patient will be adherent to HEP at least 3x a week to improve functional strength and balance for better safety at home.   ? Baseline 11/15/2020- Patient reports performing seated LE hip march, knee ext, sit to stand and walking daily and states no questions. Advised patient to practice static standing using his walker or kitchen counter several times per day without UE support.   ? Time 4   ? Period Weeks   ? Status Achieved   ? Target Date 11/01/20   ?  ? PT SHORT TERM GOAL #2  ? Title Patient will be independent in bed mobility including sit to supine utilizing bed rails as needed for increased mobility at home.   ? Baseline 6/29: requires min A 8/24: patient reports needing mod A 12/7: patient reports needing min<>mod A; 05/09/21 MOD A for BLE assist during sit>sup 4/5: min-mod A   ? Time 4   ? Period Weeks   ? Status On-going   ? Target Date 08/08/21   ? ?  ?  ? ?  ? ? ? ? PT Long Term Goals - 07/11/21 1314   ? ?  ? PT LONG TERM GOAL #1  ? Title Patient will improve FOTO score to > 50% to indicate improve functional mobility at home and in community.   ? Baseline 6/29: 42% 8/24: 40% 10/17: 40% 12/12: 46%; 05/09/21: 34% 3/13: 40% 4/5: 34%   ? Time 8   ? Period Weeks   ? Status On-going   ? Target Date 09/05/21   ?  ? PT LONG TERM GOAL #2  ? Title Patient (< 51 years old) will complete five times sit to stand test in < 20 seconds indicating an increased LE strength and improved balance.   ? Baseline 6/29: 23.8 sec with 2HHA. 11/15/2020= 48 sec with BUE Support and CGA using gait belt. 8/24: 23 seconds with 2 HHA 10/17: 19.23 seconds with BUE support 12/7: 20.21s BUE; 05/09/21: 35 seconds with BUE support and support using gait belt 3/13: 17.81 with one hand on walker one hand on chair. 48 seconds to full stand with three posterior LOB   ? Time 8   ? Period Weeks   ?  Status Achieved   ? Target Date 07/11/21   ?  ? PT LONG TERM GOAL #3  ? Title Patient will increase 10 meter walk test  to >0.15 m/s (60 sec or less) as to improve gait speed for better home ambulation and to reduce fall risk.   ? Baseline 6/29: 0.08 m/s with RW. 11/15/2020= 0.093 m/s with RW 8/24: 0.15 m/s 12/7= 0.82ms with RW; 05/09/21: 0.021m w/ RW   ? Time 8   ? Period Weeks   ? Status Achieved   ? Target Date 07/11/21   ?  ? PT LONG TERM GOAL #4  ? Title Patient will increase BLE gross strength to 4+/5 as to improve functional strength for independent gait, increased standing tolerance and increased ADL ability.   ? Baseline 6/29: see flowsheet. 8/24: hips grossly 4-/5 with hip extension 2+/5; L knee flexion 2+/5 knee extension 4/5; R knee 3+/5 10/17: see note 12/7: see note; 05/09/21: see note 3/13; see note 4/5: Hips 4/5 hamstring 3/5; quads 4-/5   ? Time 8   ? Period Weeks   ? Status Partially Met   ? Target Date 09/05/21   ?  ? PT LONG TERM GOAL #5  ? Title Patient will ambulate 250 ft with least assistive device and Supervision with no breaks to allow for increased mobility within home.   ? Baseline 11/15/2020-Ambulation distance = approx 110 feet using pediatric bariatric walker 8/24: 155 ft with bariatric RW 10/17: 170 ft with bariatric RW 12/7: [id number] bariatric RW, see note; 05/09/21: [id number] bariatric RW 3/13: rest breaks at 55, 70, 20 ft; total 145 ft 4/5: 100 ft   ? Time 8   ? Period Weeks   ? Status On-going   ? Target Date 09/05/21   ?  ? PT LONG TERM GOAL #6  ? Title Patient will increase 10 meter walk test to >0.5 m/s ( 30 seconds) as to improve gait speed for better home ambulation and to reduce fall risk.   ? Baseline 8/24: 61 seconds: 0.15 m/s 10/17: 64 seconds ~0.15 m/s 12/7: 0.29m/67m1.5s); 05/09/21: 0.62m/s27m3: 70 seconds: 0.14 m/s  4/5: 1 min 13 seconds with 0.13 m/s   ? Time 8   ? Period Weeks   ? Status On-going   ? Target Date 09/05/21   ?  ? PT LONG TERM GOAL #7  ? Title Patient (< 60  ye47s old) will complete five times sit to stand test in < 20 seconds with SUE support  indicating an increased LE strength and improved balance.   ? Baseline 10/17: 19.23 seconds with BUE support 12/7: 20.21s with SU

## 2021-07-23 ENCOUNTER — Ambulatory Visit: Payer: Medicare Other

## 2021-07-25 ENCOUNTER — Ambulatory Visit: Payer: Medicare Other

## 2021-07-30 ENCOUNTER — Ambulatory Visit: Payer: Medicare Other

## 2021-08-01 ENCOUNTER — Ambulatory Visit: Payer: Medicare Other

## 2021-08-06 ENCOUNTER — Ambulatory Visit: Payer: Medicare Other

## 2021-08-08 ENCOUNTER — Ambulatory Visit: Payer: Medicare Other

## 2021-08-13 ENCOUNTER — Ambulatory Visit: Payer: Medicare Other

## 2021-08-15 ENCOUNTER — Ambulatory Visit: Payer: Medicare Other

## 2021-08-20 ENCOUNTER — Ambulatory Visit: Payer: Medicare Other

## 2021-08-20 IMAGING — MR MR HEAD WO/W CM
15 series · 46 of 48 positions shown · IV contrast (gadavist)
Comparison: Brain MRI 08/05/2019

CLINICAL DATA: CVA follow-up

EXAM:
MRI HEAD WITHOUT AND WITH CONTRAST
TECHNIQUE: Multiplanar, multiecho pulse sequences of the brain and surrounding
structures were obtained without and with intravenous contrast.
CONTRAST:  10mL GADAVIST GADOBUTROL 1 MMOL/ML IV SOLN

[Series 5: ax dwi_tracew · axial · 3.0mm · 0.60mm/px · z∈[-105,+50]mm · 4 of 48 slices shown]
[im 1/48]
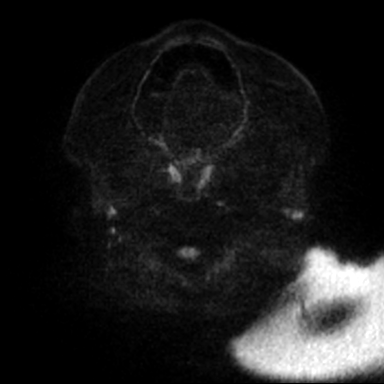
[im 16/48]
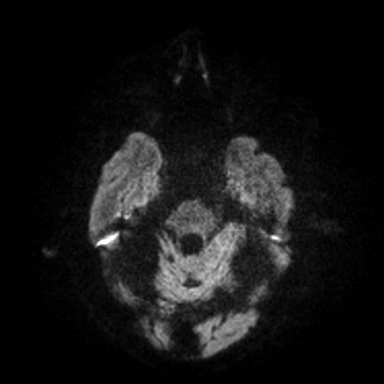
[im 32/48]
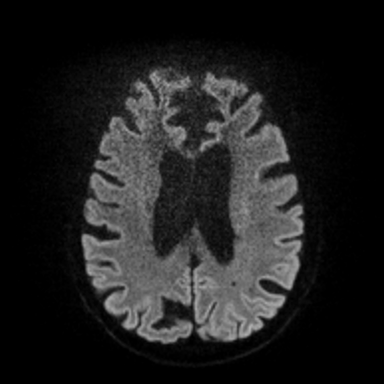
[im 48/48]
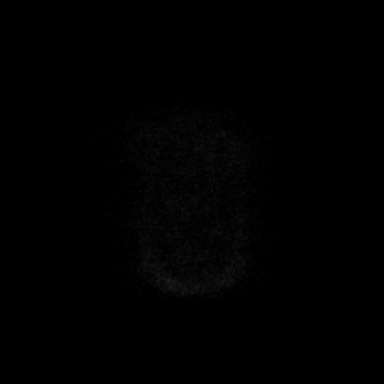

[Series 6: ax dwi_adc · axial · 3.0mm · 0.60mm/px · z∈[-105,+50]mm · 3 of 48 slices shown]
[im 1/48]
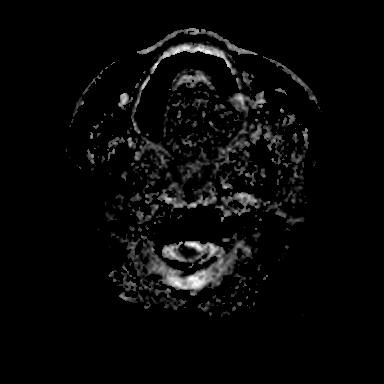
[im 24/48]
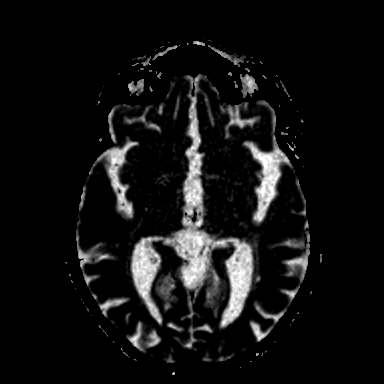
[im 48/48]
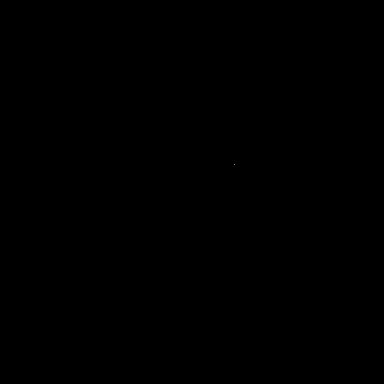

[Series 7: cor dwi_tracew · coronal · 5.0mm · 0.60mm/px · 2 of 36 slices shown]
[im 1/36]
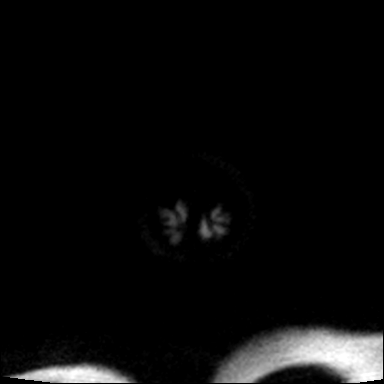
[im 36/36]
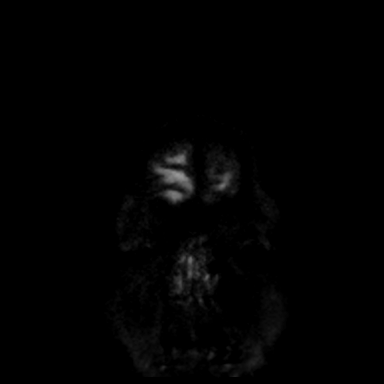

[Series 8: cor dwi_adc · coronal · 5.0mm · 0.60mm/px · 2 of 35 slices shown]
[im 1/35]
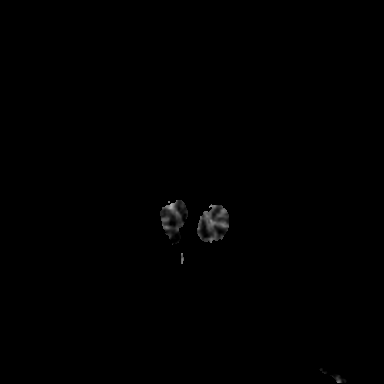
[im 35/35]
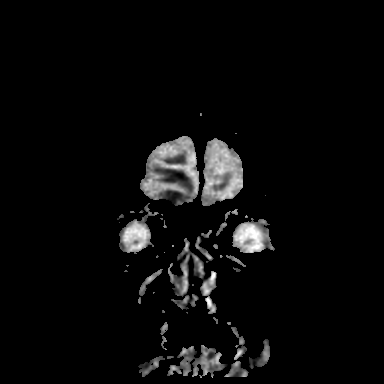

[Series 9: T1 · sagittal · 5.0mm · 0.62mm/px · 1 of 21 slices shown (1 of 2)]
[im 1/21]
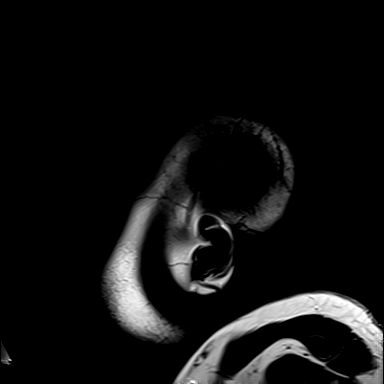

[Series 10: T2 · axial · 5.0mm · 0.53mm/px · 1 of 25 slices shown]
[im 1/25]
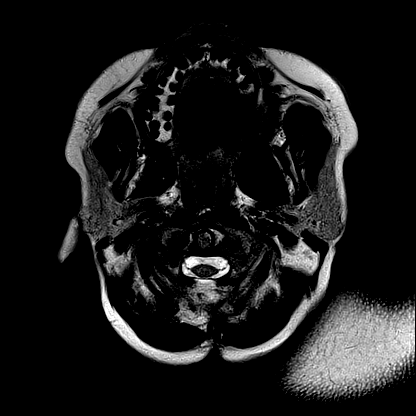

[Series 11: mag_images · axial · 3.0mm · 0.90mm/px · z∈[-119,+57]mm · 3 of 60 slices shown]
[im 1/60]
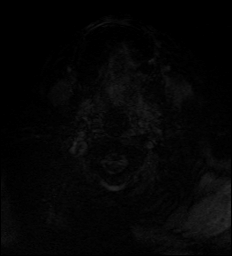
[im 30/60]
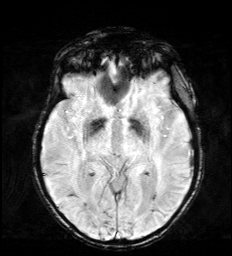
[im 60/60]
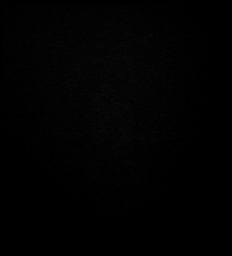

[Series 12: pha_images · axial · 3.0mm · 0.90mm/px · z∈[-119,+48]mm · 3 of 57 slices shown]
[im 1/57]
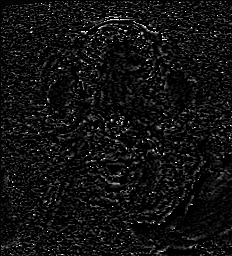
[im 29/57]
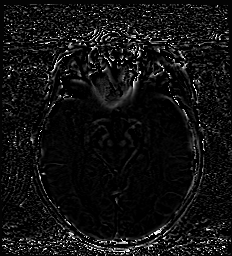
[im 57/57]
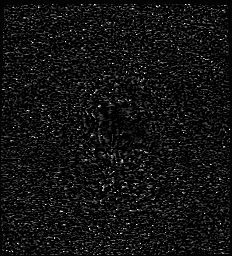

[Series 13: swi_images · axial · 3.0mm · 0.90mm/px · z∈[-119,-32]mm · 2 of 60 slices shown]
[im 1/60]
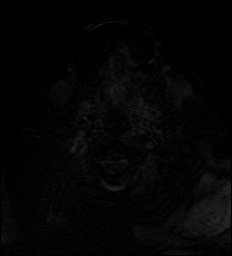
[im 30/60]
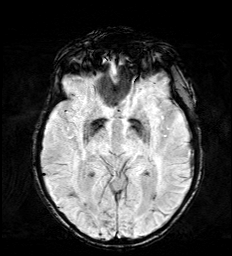

[Series 15: FLAIR · axial · 3.0mm · 0.53mm/px · z∈[-111,+50]mm · 3 of 55 slices shown]
[im 1/55]
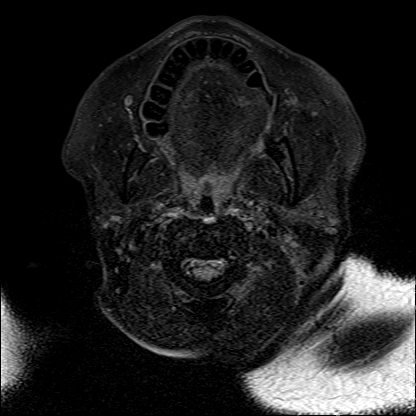
[im 28/55]
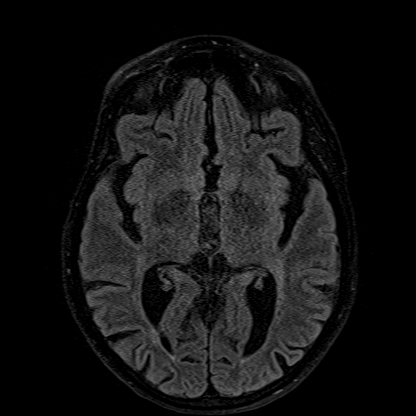
[im 55/55]
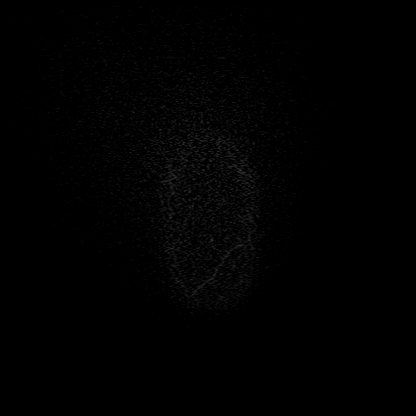

[Series 16: T1 · axial · 1.0mm · 0.98mm/px · z∈[-109,+49]mm · 8 of 160 slices shown (2 of 2)]
[im 1/160]
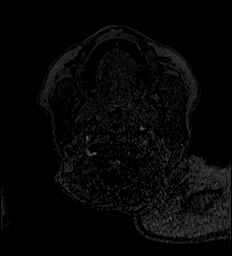
[im 20/160]
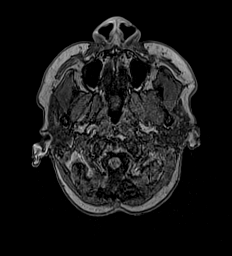
[im 40/160]
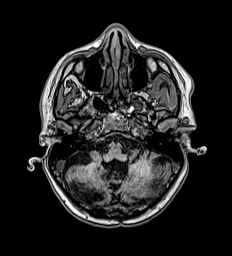
[im 60/160]
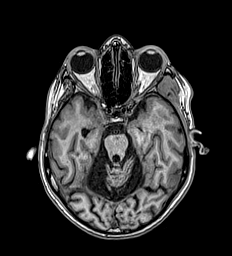
[im 100/160]
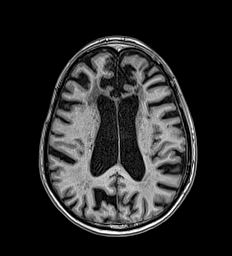
[im 120/160]
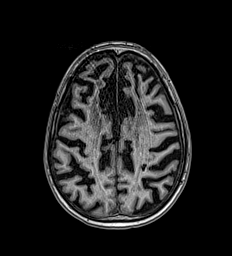
[im 140/160]
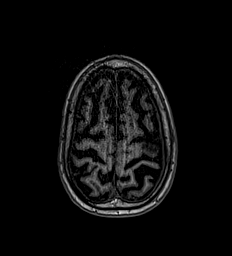
[im 160/160]
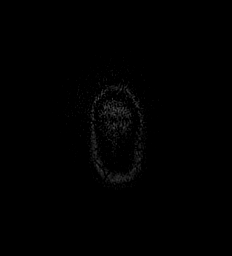

[Series 17: T2 post-contrast · coronal · 5.0mm · 0.57mm/px · 2 of 29 slices shown]
[im 1/29]
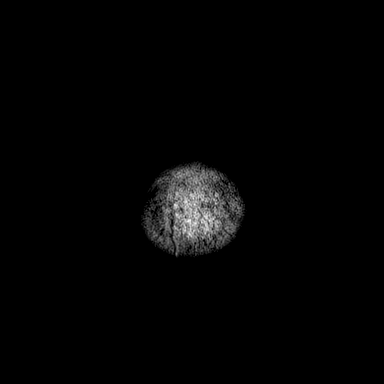
[im 29/29]
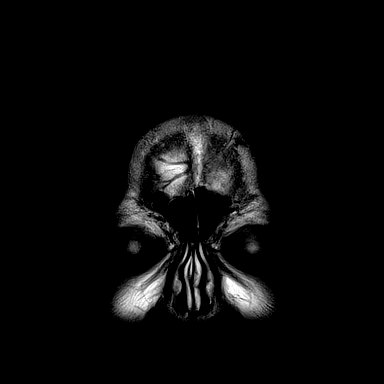

[Series 18: T1 post-contrast · axial · 1.0mm · 0.98mm/px · z∈[-109,+49]mm · 9 of 160 slices shown (1 of 3)]
[im 1/160]
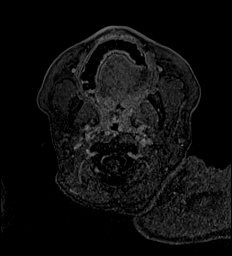
[im 20/160]
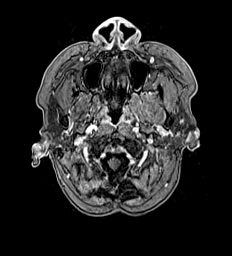
[im 40/160]
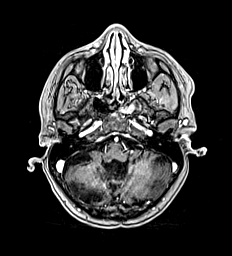
[im 60/160]
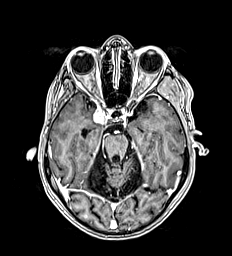
[im 80/160]
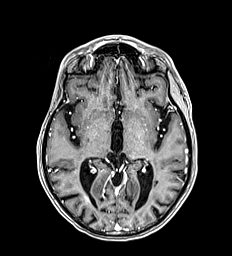
[im 100/160]
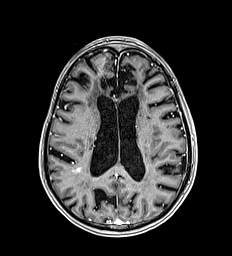
[im 120/160]
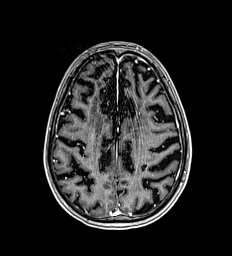
[im 140/160]
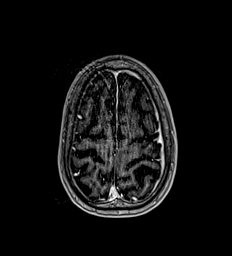
[im 160/160]
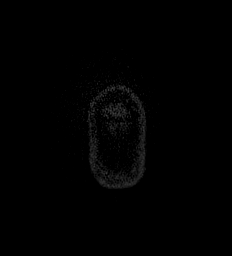

[Series 19: T1 post-contrast · coronal · 5.0mm · 0.57mm/px · 2 of 29 slices shown (2 of 3)]
[im 1/29]
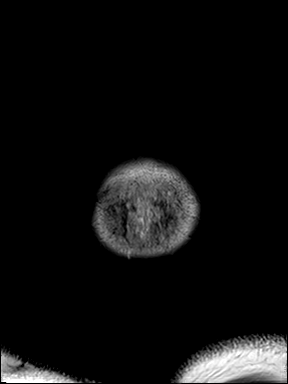
[im 29/29]
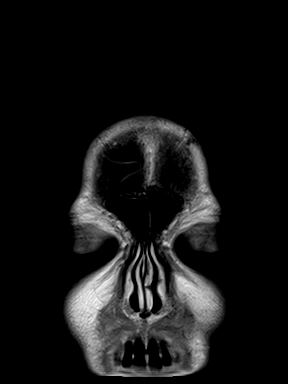

[Series 20: T1 post-contrast · sagittal · 5.0mm · 0.62mm/px · 1 of 21 slices shown (3 of 3)]
[im 1/21]
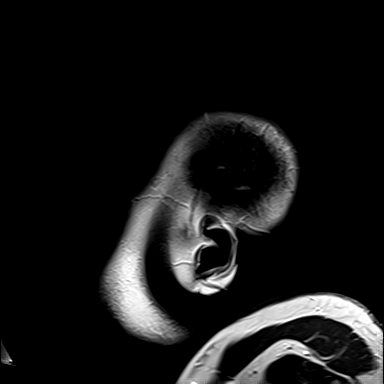

[46 of 48 positions shown; findings below may reference images not displayed]

FINDINGS: Brain: No acute infarct, acute hemorrhage or extra-axial collection.
Unchanged superior left convexity meningioma measuring 2.1 cm. Right
para clinoid meningioma is unchanged in size measuring 1.7 x 1.1 cm.
Early confluent hyperintense T2-weighted signal of the
periventricular and deep white matter, most commonly due to chronic
ischemic microangiopathy. Advanced atrophy for age. Hemosiderin
deposition at multiple areas, most notable in the occipital lobes.
Bilateral medial cerebellar and frontal encephalomalacia is
unchanged.

1. Area of contrast enhancement in the medial right cerebellum
measures 1.8 x 1.1 cm, unchanged. Series 18, image 36. Other, less
distinct foci of enhancement in the cerebellum are also unchanged.
2. Small area of contrast enhancement within the right parietal
periatrial white matter is unchanged. Series 20 image 7
3. A small area of contrast enhancement adjacent to the frontal horn
of the left lateral ventricle is unchanged (series 18, image 93).
4. Unchanged appearance of right parietal white matter area of
enhancement (image 101).

Vascular: Normal flow voids.

Skull and upper cervical spine: Remote suboccipital craniectomy.

Sinuses/Orbits: Negative.

Other: None
IMPRESSION: 1. Unchanged pattern of multifocal ill-defined contrast enhancement
within the right cerebellum and bilateral cerebral white matter.
2. Unchanged left convexity and right paraclinoid meningiomas.
3. Advanced atrophy and chronic ischemic microangiopathy, including
areas of bilateral medial encephalomalacia of the frontal lobes and
cerebellar hemispheres.

## 2021-08-22 ENCOUNTER — Ambulatory Visit: Payer: Medicare Other

## 2021-08-27 ENCOUNTER — Ambulatory Visit: Payer: Medicare Other

## 2021-08-29 ENCOUNTER — Ambulatory Visit: Payer: Medicare Other

## 2021-09-05 ENCOUNTER — Ambulatory Visit: Payer: Medicare Other

## 2021-09-10 ENCOUNTER — Ambulatory Visit: Payer: Medicare Other

## 2021-09-12 ENCOUNTER — Ambulatory Visit: Payer: Medicare Other

## 2021-09-17 ENCOUNTER — Ambulatory Visit: Payer: Medicare Other

## 2021-09-19 ENCOUNTER — Ambulatory Visit: Payer: Medicare Other

## 2021-09-24 ENCOUNTER — Ambulatory Visit: Payer: Medicare Other

## 2021-09-26 ENCOUNTER — Ambulatory Visit: Payer: Medicare Other

## 2021-10-01 ENCOUNTER — Ambulatory Visit: Payer: Medicare Other

## 2021-10-03 ENCOUNTER — Ambulatory Visit: Payer: Medicare Other

## 2021-10-07 NOTE — Therapy (Incomplete)
OUTPATIENT PHYSICAL THERAPY NEURO RE-EVALUATION   Patient Name: Curtis Powell MRN: [id number] DOB:06-26-1965, 56 y.o., male Today's Date: 10/07/2021   PCP: Gracy Bruins REFERRING PROVIDER: Valera Castle     Past Medical History:  Diagnosis Date   GERD (gastroesophageal reflux disease)    Hyperlipidemia    Hypertension    Obesity    Stroke [facility])    Past Surgical History:  Procedure Laterality Date   BRAIN SURGERY     Patient Active Problem List   Diagnosis Date Noted   Impaired glucose tolerance 03/23/2020   Learning disability 03/23/2020   Urinary and fecal incontinence 06/14/2019   Chronic pain syndrome 03/23/2019   Constipation 03/23/2019   Dysarthria 03/23/2019   Dysphagia 03/23/2019   Learning difficulty 03/23/2019   Morbid obesity ([place]) 03/23/2019   Muscle weakness 03/23/2019   Vitamin D deficiency 03/23/2019   Bilateral carotid artery stenosis 12/15/2018   Moderate aortic valve stenosis 12/15/2018   BMI 45.0-49.9, adult ([place]) 11/10/2018   Cerebrovascular accident ([place]) 05/02/2016   Hemiparesis affecting right side as late effect of cerebrovascular accident ([place]) 05/01/2016   Brain tumor ([place]) 11/24/2014   Gastro-esophageal reflux disease without esophagitis 11/24/2014   Accident due to mechanical fall without injury 08/03/2014   Essential hypertension 06/13/2014   Mixed hyperlipidemia 06/13/2014   Unspecified sequelae of cerebral infarction 06/13/2014   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  Rationale for Evaluation and Treatment Rehabilitation  SUBJECTIVE:                                                                                                                                                                                               SUBJECTIVE STATEMENT: *** Pt accompanied by: {accompnied:27141}  PERTINENT HISTORY: Patient is a 56 year old male who is returning to physical therapy for weakness and limited mobility s/p CVA (2018). Patient was last seen in this clinic on 07/18/21. He has R sided deficits s/p stroke and his mom is his primary caregiver.   PAIN:  Are you having pain? {OPRCPAIN:27236}  PRECAUTIONS: Fall  WEIGHT BEARING RESTRICTIONS No  FALLS: Has patient fallen in last 6 months? {fallsyesno:27318}  LIVING ENVIRONMENT: Lives with: {OPRC lives with:25569::"lives with their family"} Lives in: {Lives in:25570} Stairs: {opstairs:27293} Has following equipment at home: {Assistive devices:23999}  PLOF: {PLOF:24004}  PATIENT GOALS ***  OBJECTIVE:   DIAGNOSTIC FINDINGS: ***  COGNITION: Overall cognitive status: {cognition:24006}   SENSATION: {sensation:27233}  COORDINATION: ***  EDEMA:  {edema:24020}  MUSCLE TONE: {LE tone:25568}   MUSCLE LENGTH: Hamstrings: Right *** deg; Left *** deg Marcello Moores test: Right *** deg; Left *** deg  DTRs:  {DTR SITE:24025}  POSTURE: {posture:25561}  LOWER EXTREMITY ROM:     {AROM/PROM:27142}  Right Eval Left Eval  Hip flexion    Hip extension    Hip abduction    Hip adduction    Hip internal rotation    Hip external rotation    Knee flexion    Knee extension    Ankle dorsiflexion    Ankle plantarflexion    Ankle inversion    Ankle eversion     (Blank rows = not tested)  LOWER EXTREMITY MMT:    MMT Right Eval Left Eval  Hip flexion    Hip extension    Hip abduction    Hip adduction    Hip internal rotation    Hip external rotation    Knee flexion    Knee extension    Ankle dorsiflexion    Ankle plantarflexion    Ankle inversion    Ankle eversion    (Blank rows = not tested)  BED MOBILITY:  {Bed mobility:24027}  TRANSFERS: Assistive device utilized: {Assistive devices:23999}   Sit to stand: {Levels of assistance:24026} Stand to sit: {Levels of assistance:24026} Chair to chair: {Levels of assistance:24026} Floor: {Levels of assistance:24026}  RAMP:  Level of Assistance: {Levels of assistance:24026} Assistive device utilized: {Assistive devices:23999} Ramp Comments: ***  CURB:  Level of Assistance: {Levels of assistance:24026} Assistive device utilized: {Assistive devices:23999} Curb Comments: ***  STAIRS:  Level of Assistance: {Levels of assistance:24026}  Stair Negotiation Technique: {Stair Technique:27161} with {Rail Assistance:27162}  Number of Stairs: ***   Height of Stairs: ***  Comments: ***  GAIT: Gait pattern: {gait characteristics:25376} Distance walked: *** Assistive device utilized: {Assistive devices:23999} Level of assistance: {Levels of assistance:24026} Comments: ***  FUNCTIONAL TESTs:  {Functional tests:24029}  PATIENT SURVEYS:  {rehab surveys:24030}  TODAY'S TREATMENT:  ***   PATIENT EDUCATION: Education details: *** Person educated: {Person educated:25204} Education method: {Education Method:25205} Education comprehension: {Education Comprehension:25206}   HOME EXERCISE PROGRAM: ***    GOALS: Goals reviewed with patient? {yes/no:20286}  SHORT TERM GOALS: Target date: {follow up:25551}  *** Baseline: Goal status: {GOALSTATUS:25110}  2.  *** Baseline:  Goal status: {GOALSTATUS:25110}  3.  *** Baseline:  Goal status: {GOALSTATUS:25110}  4.  *** Baseline:  Goal status: {GOALSTATUS:25110}  5.  *** Baseline:  Goal status: {GOALSTATUS:25110}  6.  *** Baseline:  Goal status: {GOALSTATUS:25110}  LONG TERM GOALS: Target date: {follow up:25551}  *** Baseline:  Goal status: {GOALSTATUS:25110}  2.  *** Baseline:  Goal status: {GOALSTATUS:25110}  3.  *** Baseline:  Goal status: {GOALSTATUS:25110}  4.  *** Baseline:  Goal status: {GOALSTATUS:25110}  5.  *** Baseline:  Goal status:  {GOALSTATUS:25110}  6.  *** Baseline:  Goal status: {GOALSTATUS:25110}  ASSESSMENT:  CLINICAL IMPRESSION: Patient is a *** y.o. *** who was seen today for physical therapy evaluation and treatment for ***.    OBJECTIVE IMPAIRMENTS {opptimpairments:25111}.   ACTIVITY LIMITATIONS {activitylimitations:27494}  PARTICIPATION LIMITATIONS: {participationrestrictions:25113}  PERSONAL FACTORS {Personal factors:25162} are also affecting patient's functional outcome.   REHAB POTENTIAL: {rehabpotential:25112}  CLINICAL DECISION MAKING: {clinical decision making:25114}  EVALUATION COMPLEXITY: {Evaluation complexity:25115}  PLAN: PT FREQUENCY: {rehab frequency:25116}  PT DURATION: {rehab duration:25117}  PLANNED INTERVENTIONS: {rehab planned interventions:25118::"Therapeutic exercises","Therapeutic activity","Neuromuscular re-education","Balance training","Gait training","Patient/Family education","Joint mobilization"}  PLAN FOR  NEXT SESSION: ***   Janna Arch, PT 10/07/2021, 11:56 AM

## 2021-10-08 ENCOUNTER — Ambulatory Visit: Payer: Medicare Other | Attending: Family Medicine

## 2021-10-08 DIAGNOSIS — M6281 Muscle weakness (generalized): Secondary | ICD-10-CM | POA: Diagnosis present

## 2021-10-08 DIAGNOSIS — R531 Weakness: Secondary | ICD-10-CM | POA: Insufficient documentation

## 2021-10-08 DIAGNOSIS — R296 Repeated falls: Secondary | ICD-10-CM | POA: Diagnosis present

## 2021-10-08 DIAGNOSIS — R262 Difficulty in walking, not elsewhere classified: Secondary | ICD-10-CM | POA: Diagnosis present

## 2021-10-08 DIAGNOSIS — R2681 Unsteadiness on feet: Secondary | ICD-10-CM | POA: Diagnosis present

## 2021-10-08 DIAGNOSIS — R2689 Other abnormalities of gait and mobility: Secondary | ICD-10-CM | POA: Diagnosis present

## 2021-10-08 DIAGNOSIS — R278 Other lack of coordination: Secondary | ICD-10-CM | POA: Insufficient documentation

## 2021-10-08 DIAGNOSIS — R269 Unspecified abnormalities of gait and mobility: Secondary | ICD-10-CM | POA: Insufficient documentation

## 2021-10-08 NOTE — Therapy (Addendum)
OUTPATIENT PHYSICAL THERAPY NEURO RE-EVALUATION   Patient Name: Curtis Powell MRN: [id number] DOB:1966/03/20, 56 y.o., male Today's Date: 10/08/2021   PCP: Valera Castle, MD REFERRING PROVIDER: Valera Castle, MD   PT End of Session - 10/08/21 1257     Visit Number 21    Number of Visits 92    Date for PT Re-Evaluation 12/31/21    Authorization Type UHC Medicare; [place] Medicaid    Authorization Time Period 6/10 PN 3/13    PT Start Time 1301    PT Stop Time 1345    PT Time Calculation (min) 44 min    Equipment Utilized During Treatment Gait belt    Activity Tolerance Patient limited by fatigue;Patient tolerated treatment well    Behavior During Therapy WFL for tasks assessed/performed             Past Medical History:  Diagnosis Date   GERD (gastroesophageal reflux disease)    Hyperlipidemia    Hypertension    Obesity    Stroke [facility])    Past Surgical History:  Procedure Laterality Date   BRAIN SURGERY     Patient Active Problem List   Diagnosis Date Noted   Impaired glucose tolerance 03/23/2020   Learning disability 03/23/2020   Urinary and fecal incontinence 06/14/2019   Chronic pain syndrome 03/23/2019   Constipation 03/23/2019   Dysarthria 03/23/2019   Dysphagia 03/23/2019   Learning difficulty 03/23/2019   Morbid obesity ([place]) 03/23/2019   Muscle weakness 03/23/2019   Vitamin D deficiency 03/23/2019   Bilateral carotid artery stenosis 12/15/2018   Moderate aortic valve stenosis 12/15/2018   BMI 45.0-49.9, adult ([place]) 11/10/2018   Cerebrovascular accident ([place]) 05/02/2016   Hemiparesis affecting right side as late effect of cerebrovascular accident ([place]) 05/01/2016   Brain tumor ([place]) 11/24/2014   Gastro-esophageal reflux disease without esophagitis 11/24/2014   Accident due to mechanical fall without injury 08/03/2014   Essential hypertension 06/13/2014   Mixed hyperlipidemia 06/13/2014   Unspecified sequelae of cerebral infarction  06/13/2014   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/2014   Type 2 diabetes mellitus without complication ([place]) 85/88/5027   Neck mass 09/08/2013   Swimmer's ear 09/08/2013   Erectile dysfunction 01/03/2013   Prediabetes 01/03/2013    ONSET DATE: 05/09/2016  REFERRING DIAG: CVA (cerebral vascular accident) ([place])  THERAPY DIAG:  Abnormality of gait and mobility  Difficulty in walking, not elsewhere classified  Muscle weakness (generalized)  Unsteadiness on feet  Other lack of coordination  Rationale for Evaluation and Treatment Rehabilitation  SUBJECTIVE:  SUBJECTIVE STATEMENT: Pt reports "been doing fine" since last time coming to PT. Denies any stumbles/falls and reports no pain. Pt reports has a bicycle at home that he has been using everyday (~15-20 minutes).  Pt accompanied by: family member  PERTINENT HISTORY: Pt reports no changes in medical history since previously being seen in clinic for PT. Per 10/04/20 PT evaluation note: "Had a stroke in Feb 2018. He was previously fully independent, but this stroke caused severe residual deficits, mainly on the right side. He reports difficulty walking and decreased mobility. His mom had to move in with him and now is his main caregiver. Patient had outpatient PT from November 2018-March 2022. Patient reports since being discharged from outpatient therapy he has kept up with his HEP daily. He does report desire for walking better and for getting in and out of bed easier. He reports his mom is helping with some ADLs. He reports needing help with rolling in bed. He reports his mom/aide assist with dressing. He has a home aide 3 days out of the week." PMH significant for hx of brain tumor, past CVA, and HTN. See chart for additional information.  PAIN:  Are you having pain?  No  PRECAUTIONS: None  WEIGHT BEARING RESTRICTIONS No  FALLS: Has patient fallen in last 6 months? No  LIVING ENVIRONMENT: Lives with: lives with their family Lives in: House/apartment Stairs: No Has following equipment at home: Single point cane, Quad cane small base, Walker - 2 wheeled, Hemi walker, Wheelchair (manual), shower chair, and Grab bars  PLOF: Independent  PATIENT GOALS "to walk and sit-to-stand"  OBJECTIVE:   DIAGNOSTIC FINDINGS: None recent   SENSATION: Light touch: WFL   POSTURE: rounded shoulders, forward head, and flexed trunk   LOWER EXTREMITY MMT:    MMT Right Eval Left Eval  Hip flexion 4- 4-  Hip abduction 4 4  Hip adduction 4 4  Knee flexion 4- 4-  Knee extension 4 4  Ankle dorsiflexion 4- 4-  Ankle plantarflexion 4- 4-  (Blank rows = not tested)  GRIP STRENGTH:  Grossly 3+/5  TRANSFERS: Assistive device utilized:  Bariatric 2-wheeled walker   Sit to stand: Min A Stand to sit: CGA and Min A  GAIT: Gait pattern: decreased stride length, decreased ankle dorsiflexion- Right, decreased ankle dorsiflexion- Left, trunk flexed, wide BOS, poor foot clearance- Right, and poor foot clearance- Left Distance walked: 60.3 ft Assistive device utilized:  Bariatric 2-wheeled Level of assistance: CGA Comments: with w/c follow  FUNCTIONAL TESTs:  5 times sit to stand: 1 min 1 sec 10 meter walk test: 0.11 m/s  PATIENT SURVEYS:  FOTO 50  TODAY'S TREATMENT:  Discussed continuing with HEP from previous PT.   PATIENT EDUCATION: Education details: POC, outcome measures & results, goals, HEP Person educated: Patient Education method: Explanation, Demonstration, and Verbal cues Education comprehension: verbalized understanding, returned demonstration, and verbal cues required   HOME EXERCISE PROGRAM: Instructed to continue with daily walking.    GOALS: Goals reviewed with patient? No  SHORT TERM GOALS: Target date: 11/19/2021  Pt will  be independent with HEP in order to improve strength and balance in order to decrease fall risk and improve function at home. Baseline: NEW Goal status: INITIAL   LONG TERM GOALS: Target date: 12/31/2021  Pt will increase FOTO to at least 57 in order to demonstrate improvement in function related to mobility and QoL. Baseline: 7/3: 50 Goal status: INITIAL  2.  Pt (< 39 yrs old) will complete  5xSTS test < 20 seconds indicating an increase in LE strength and improved balance. Baseline: 7/3: 41mn:01seconds,  Goal status: INITIAL  3.  Pt will increase 10MWT to > 0.5 m/s (30 sec) as to improve gait speed for better home ambulation and reduce fall risk. Baseline: 7/3: 0.11 m/s with bRW Goal status: INITIAL  4.  Pt will increase BLE gross strength to 4+/5 as to improve functional strenth for independent gait, increased standing tolerance, and increased ADL ability. Baseline: 7/3: grossly 4-/5 (see strength section) Goal status: INITIAL  5.  Pt will ambulate 250' with LRAD and supervision with no rest breaks to allow for improved mobility within home and increased independence. Baseline: 7/3: 60.3' Goal status: INITIAL  ASSESSMENT:  CLINICAL IMPRESSION: Patient is a pleasant 56y.o. male who was seen today for physical therapy evaluation and treatment, who is previously known to clinic. Examination reveals deficits in BLE strength, balance, endurance, gait, and mobility. The pt will benefit from skilled PT to address these deficits, increase independence with ADLs, and improve QoL.   OBJECTIVE IMPAIRMENTS Abnormal gait, decreased activity tolerance, decreased balance, decreased coordination, decreased endurance, decreased mobility, difficulty walking, decreased ROM, decreased strength, postural dysfunction, and obesity.   ACTIVITY LIMITATIONS carrying, lifting, bending, sitting, standing, squatting, stairs, transfers, bed mobility, dressing, and locomotion level  PARTICIPATION  LIMITATIONS: cleaning, laundry, interpersonal relationship, driving, and community activity  PERSONAL FACTORS Past/current experiences, Time since onset of injury/illness/exacerbation, and 3+ comorbidities: hx of brain tumor, past CVA, HTN, obesity, arthritis  are also affecting patient's functional outcome.   REHAB POTENTIAL: Good  CLINICAL DECISION MAKING: Stable/uncomplicated  EVALUATION COMPLEXITY: Low  PLAN: PT FREQUENCY: 2x/week  PT DURATION: 12 weeks  PLANNED INTERVENTIONS: Therapeutic exercises, Therapeutic activity, Neuromuscular re-education, Balance training, Gait training, Patient/Family education, Joint mobilization, Stair training, Vestibular training, Orthotic/Fit training, DME instructions, Dry Needling, Electrical stimulation, Wheelchair mobility training, Cryotherapy, Moist heat, Compression bandaging, Manual therapy, and Re-evaluation  PLAN FOR NEXT SESSION: strengthening, gait, endurance training   NIzola Price SPT    MJanna Arch PT 10/08/2021, 8:05 PM  This entire session was performed under direct supervision and direction of a licensed therapist/therapist assistant . I have personally read, edited and approve of the note as written.  MJanna Arch PT, DPT

## 2021-10-10 ENCOUNTER — Ambulatory Visit: Payer: Medicare Other

## 2021-10-10 DIAGNOSIS — R2681 Unsteadiness on feet: Secondary | ICD-10-CM

## 2021-10-10 DIAGNOSIS — R278 Other lack of coordination: Secondary | ICD-10-CM

## 2021-10-10 DIAGNOSIS — R269 Unspecified abnormalities of gait and mobility: Secondary | ICD-10-CM

## 2021-10-10 DIAGNOSIS — M6281 Muscle weakness (generalized): Secondary | ICD-10-CM

## 2021-10-10 NOTE — Therapy (Signed)
OUTPATIENT PHYSICAL THERAPY NEURO TREATMENT NOTE   Patient Name: Curtis Powell MRN: [id number] DOB:April 11, 1965, 56 y.o., male Today's Date: 10/10/2021   PCP: Valera Castle, MD REFERRING PROVIDER: Valera Castle, MD   PT End of Session - 10/10/21 1258     Visit Number 2    Number of Visits 92    Date for PT Re-Evaluation 12/31/21    Authorization Type UHC Medicare; [place] Medicaid    Authorization Time Period 6/10 PN 3/13    PT Start Time 1304    PT Stop Time 1345    PT Time Calculation (min) 41 min    Equipment Utilized During Treatment Gait belt    Activity Tolerance Patient limited by fatigue;Patient tolerated treatment well    Behavior During Therapy WFL for tasks assessed/performed              Past Medical History:  Diagnosis Date   GERD (gastroesophageal reflux disease)    Hyperlipidemia    Hypertension    Obesity    Stroke [facility])    Past Surgical History:  Procedure Laterality Date   BRAIN SURGERY     Patient Active Problem List   Diagnosis Date Noted   Impaired glucose tolerance 03/23/2020   Learning disability 03/23/2020   Urinary and fecal incontinence 06/14/2019   Chronic pain syndrome 03/23/2019   Constipation 03/23/2019   Dysarthria 03/23/2019   Dysphagia 03/23/2019   Learning difficulty 03/23/2019   Morbid obesity ([place]) 03/23/2019   Muscle weakness 03/23/2019   Vitamin D deficiency 03/23/2019   Bilateral carotid artery stenosis 12/15/2018   Moderate aortic valve stenosis 12/15/2018   BMI 45.0-49.9, adult ([place]) 11/10/2018   Cerebrovascular accident ([place]) 05/02/2016   Hemiparesis affecting right side as late effect of cerebrovascular accident ([place]) 05/01/2016   Brain tumor ([place]) 11/24/2014   Gastro-esophageal reflux disease without esophagitis 11/24/2014   Accident due to mechanical fall without injury 08/03/2014   Essential hypertension 06/13/2014   Mixed hyperlipidemia 06/13/2014   Unspecified sequelae of cerebral infarction  06/13/2014   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/2014   Type 2 diabetes mellitus without complication ([place]) 98/92/1194   Neck mass 09/08/2013   Swimmer's ear 09/08/2013   Erectile dysfunction 01/03/2013   Prediabetes 01/03/2013    ONSET DATE: 05/09/2016  REFERRING DIAG: CVA (cerebral vascular accident) ([place])  THERAPY DIAG:  Abnormality of gait and mobility  Muscle weakness (generalized)  Unsteadiness on feet  Other lack of coordination  Rationale for Evaluation and Treatment Rehabilitation  SUBJECTIVE:  SUBJECTIVE STATEMENT: Pt reports feeling "fine". Denies any aches and pains. Reports no stumbles/falls. Pt reports HEP is going well.  Pt accompanied by: family member  PERTINENT HISTORY: Pt reports no changes in medical history since previously being seen in clinic for PT. Per 10/04/20 PT evaluation note: "Had a stroke in Feb 2018. He was previously fully independent, but this stroke caused severe residual deficits, mainly on the right side. He reports difficulty walking and decreased mobility. His mom had to move in with him and now is his main caregiver. Patient had outpatient PT from November 2018-March 2022. Patient reports since being discharged from outpatient therapy he has kept up with his HEP daily. He does report desire for walking better and for getting in and out of bed easier. He reports his mom is helping with some ADLs. He reports needing help with rolling in bed. He reports his mom/aide assist with dressing. He has a home aide 3 days out of the week." PMH significant for hx of brain tumor, past CVA, and HTN. See chart for additional information.  PAIN:  Are you having pain? No  PRECAUTIONS: None  WEIGHT BEARING RESTRICTIONS No  PLOF: Independent  PATIENT GOALS: "to walk and  sit-to-stand"   OBJECTIVE:   DIAGNOSTIC FINDINGS: None recent    TODAY'S TREATMENT:  10/10/2021  LAQ: 20x with 3 sec hold, pt rates as easy  Progressed to 2# AW donned, 20x with hold  Seated march: 2 x 20, 2# AW donned  Ball squeezes: 2 x 12 with 3 sec hold  Seated hip abd: 2 x 12, GTB  STS: 5x, 8x, cues for standing up all the way and hand placement when rising, CGA-minA provided by SPT  Ambulation for endurance: 150' total, 1 rest break taken after 118', with bariatric walker & w/c follow, VC for standing up tall and increasing step length   PATIENT EDUCATION: Education details: Pt educated throughout session about proper posture and technique with exercises. Improved exercise technique, movement at target joints, use of target muscles after min to mod verbal, visual, tactile cues. Person educated: Patient Education method: Explanation, Demonstration, Tactile cues, and Verbal cues Education comprehension: verbalized understanding, returned demonstration, and verbal cues required   HOME EXERCISE PROGRAM:  No updates as of 10/10/21  Instructed to continue with daily walking.    GOALS: Goals reviewed with patient? No  SHORT TERM GOALS: Target date: 11/19/2021  Pt will be independent with HEP in order to improve strength and balance in order to decrease fall risk and improve function at home. Baseline: NEW Goal status: INITIAL   LONG TERM GOALS: Target date: 12/31/2021  Pt will increase FOTO to at least 57 in order to demonstrate improvement in function related to mobility and QoL. Baseline: 7/3: 50 Goal status: INITIAL  2.  Pt (< 14 yrs old) will complete 5xSTS test < 20 seconds indicating an increase in LE strength and improved balance. Baseline: 7/3: 69mn:01seconds,  Goal status: INITIAL  3.  Pt will increase 10MWT to > 0.5 m/s (30 sec) as to improve gait speed for better home ambulation and reduce fall risk. Baseline: 7/3: 0.11 m/s with bRW Goal status:  INITIAL  4.  Pt will increase BLE gross strength to 4+/5 as to improve functional strenth for independent gait, increased standing tolerance, and increased ADL ability. Baseline: 7/3: grossly 4-/5 (see strength section) Goal status: INITIAL  5.  Pt will ambulate 250' with LRAD and supervision with no rest breaks to allow for improved  mobility within home and increased independence. Baseline: 7/3: 60.3' Goal status: INITIAL  ASSESSMENT:  CLINICAL IMPRESSION:  Session today focused primarily on strengthening BLE and endurance. Pt ambulated 118', almost double that appt, without a rest break indicating improve endurance. Pt tolerated progressing exercises with the addition of resistance well and demonstrated no major compensation strategies. The pt will benefit from further skilled PT to increase strength and endurance to improve QoL and independence with ADLs.   OBJECTIVE IMPAIRMENTS Abnormal gait, decreased activity tolerance, decreased balance, decreased coordination, decreased endurance, decreased mobility, difficulty walking, decreased ROM, decreased strength, postural dysfunction, and obesity.   ACTIVITY LIMITATIONS carrying, lifting, bending, sitting, standing, squatting, stairs, transfers, bed mobility, dressing, and locomotion level  PARTICIPATION LIMITATIONS: cleaning, laundry, interpersonal relationship, driving, and community activity  PERSONAL FACTORS Past/current experiences, Time since onset of injury/illness/exacerbation, and 3+ comorbidities: hx of brain tumor, past CVA, HTN, obesity, arthritis  are also affecting patient's functional outcome.   REHAB POTENTIAL: Good  CLINICAL DECISION MAKING: Stable/uncomplicated  EVALUATION COMPLEXITY: Low  PLAN: PT FREQUENCY: 2x/week  PT DURATION: 12 weeks  PLANNED INTERVENTIONS: Therapeutic exercises, Therapeutic activity, Neuromuscular re-education, Balance training, Gait training, Patient/Family education, Joint mobilization,  Stair training, Vestibular training, Orthotic/Fit training, DME instructions, Dry Needling, Electrical stimulation, Wheelchair mobility training, Cryotherapy, Moist heat, Compression bandaging, Manual therapy, and Re-evaluation  PLAN FOR NEXT SESSION: strengthening, gait, endurance training   Izola Price, SPT  This entire session was performed under direct supervision and direction of a licensed therapist. I have personally read, edited and approve of the note as written. Ricard Dillon PT, DPT   Izola Price, Student-PT 10/10/2021, 2:54 PM

## 2021-10-15 ENCOUNTER — Ambulatory Visit: Payer: Medicare Other

## 2021-10-15 DIAGNOSIS — R262 Difficulty in walking, not elsewhere classified: Secondary | ICD-10-CM

## 2021-10-15 DIAGNOSIS — R269 Unspecified abnormalities of gait and mobility: Secondary | ICD-10-CM

## 2021-10-15 DIAGNOSIS — M6281 Muscle weakness (generalized): Secondary | ICD-10-CM

## 2021-10-15 DIAGNOSIS — R2681 Unsteadiness on feet: Secondary | ICD-10-CM

## 2021-10-15 DIAGNOSIS — R278 Other lack of coordination: Secondary | ICD-10-CM

## 2021-10-15 NOTE — Therapy (Signed)
OUTPATIENT PHYSICAL THERAPY NEURO TREATMENT NOTE Physical Therapy Progress Note   Dates of reporting period  06/18/2021  to   10/15/2021    Patient Name: Curtis Powell MRN: [id number] DOB:1966-02-14, 56 y.o., male Today's Date: 10/15/2021   PCP: Valera Castle, MD REFERRING PROVIDER: Valera Castle, MD   PT End of Session - 10/15/21 1301     Visit Number 52    Number of Visits 61    Date for PT Re-Evaluation 12/31/21    Authorization Type UHC Medicare; [place] Medicaid    Authorization Time Period 6/10 PN 3/13    PT Start Time 1301    PT Stop Time 1345    PT Time Calculation (min) 44 min    Equipment Utilized During Treatment Gait belt    Activity Tolerance Patient tolerated treatment well    Behavior During Therapy WFL for tasks assessed/performed               Past Medical History:  Diagnosis Date   GERD (gastroesophageal reflux disease)    Hyperlipidemia    Hypertension    Obesity    Stroke [facility])    Past Surgical History:  Procedure Laterality Date   BRAIN SURGERY     Patient Active Problem List   Diagnosis Date Noted   Impaired glucose tolerance 03/23/2020   Learning disability 03/23/2020   Urinary and fecal incontinence 06/14/2019   Chronic pain syndrome 03/23/2019   Constipation 03/23/2019   Dysarthria 03/23/2019   Dysphagia 03/23/2019   Learning difficulty 03/23/2019   Morbid obesity ([place]) 03/23/2019   Muscle weakness 03/23/2019   Vitamin D deficiency 03/23/2019   Bilateral carotid artery stenosis 12/15/2018   Moderate aortic valve stenosis 12/15/2018   BMI 45.0-49.9, adult ([place]) 11/10/2018   Cerebrovascular accident ([place]) 05/02/2016   Hemiparesis affecting right side as late effect of cerebrovascular accident ([place]) 05/01/2016   Brain tumor ([place]) 11/24/2014   Gastro-esophageal reflux disease without esophagitis 11/24/2014   Accident due to mechanical fall without injury 08/03/2014   Essential hypertension 06/13/2014   Mixed  hyperlipidemia 06/13/2014   Unspecified sequelae of cerebral infarction 06/13/2014   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/2014   Type 2 diabetes mellitus without complication ([place]) 22/97/9892   Neck mass 09/08/2013   Swimmer's ear 09/08/2013   Erectile dysfunction 01/03/2013   Prediabetes 01/03/2013    ONSET DATE: 05/09/2016  REFERRING DIAG: CVA (cerebral vascular accident) ([place])  THERAPY DIAG:  Abnormality of gait and mobility  Muscle weakness (generalized)  Unsteadiness on feet  Other lack of coordination  Difficulty in walking, not elsewhere classified  Rationale for Evaluation and Treatment Rehabilitation  SUBJECTIVE:  SUBJECTIVE STATEMENT: Pt reports feeling "good" since last visit. Denies any stumbles/falls. Reports no pain at rest. Pt reports walking around the house over the weekend.   Pt accompanied by: family member  PERTINENT HISTORY: Pt reports no changes in medical history since previously being seen in clinic for PT. Per 10/04/20 PT evaluation note: "Had a stroke in Feb 2018. He was previously fully independent, but this stroke caused severe residual deficits, mainly on the right side. He reports difficulty walking and decreased mobility. His mom had to move in with him and now is his main caregiver. Patient had outpatient PT from November 2018-March 2022. Patient reports since being discharged from outpatient therapy he has kept up with his HEP daily. He does report desire for walking better and for getting in and out of bed easier. He reports his mom is helping with some ADLs. He reports needing help with rolling in bed. He reports his mom/aide assist with dressing. He has a home aide 3 days out of the week." PMH significant for hx of brain tumor, past CVA, and HTN. See chart for additional  information.  PAIN:  Are you having pain? No  PRECAUTIONS: None  WEIGHT BEARING RESTRICTIONS No  PLOF: Independent  PATIENT GOALS: "to walk and sit-to-stand"   OBJECTIVE:   Goals recently reassessed on 10/08/2021 for re-evaluation, see that note for goal details.    TODAY'S TREATMENT:  10/15/2021  LAQ: 2 x 15 with 3 sec hold, 3# AW donned, pt rates as medium  Seated march: 2 x 20, 3# AW donned, pt reports more challenging with heavier weights donned  Ball squeezes: 2 x 15 with 3 sec hold  Seated hip abd: 2 x 15, GTB, pt rates as medium  STS: 5x, continue VCs for standing up all the way  Ambulation for endurance: 135' total, 133' with no rest break, with bariatric walker & w/c follow. 1 posterior LOB, pt sat back into w/c.  Seated heel raises: 20x each leg   PATIENT EDUCATION:  Education details: Pt educated throughout session about proper posture and technique with exercises. Improved exercise technique, movement at target joints, use of target muscles after min to mod verbal, visual, tactile cues. Person educated: Patient Education method: Explanation, Demonstration, Tactile cues, and Verbal cues Education comprehension: verbalized understanding, returned demonstration, and verbal cues required   HOME EXERCISE PROGRAM:  No updates as of 10/15/21  Instructed to continue with daily walking.    GOALS: Goals reviewed with patient? No  SHORT TERM GOALS: Target date: 11/19/2021  Pt will be independent with HEP in order to improve strength and balance in order to decrease fall risk and improve function at home. Baseline: NEW Goal status: INITIAL   LONG TERM GOALS: Target date: 12/31/2021  Pt will increase FOTO to at least 57 in order to demonstrate improvement in function related to mobility and QoL. Baseline: 7/3: 50 Goal status: INITIAL  2.  Pt (< 69 yrs old) will complete 5xSTS test < 20 seconds indicating an increase in LE strength and improved  balance. Baseline: 7/3: 40mn:01seconds,  Goal status: INITIAL  3.  Pt will increase 10MWT to > 0.5 m/s (30 sec) as to improve gait speed for better home ambulation and reduce fall risk. Baseline: 7/3: 0.11 m/s with bRW Goal status: INITIAL  4.  Pt will increase BLE gross strength to 4+/5 as to improve functional strenth for independent gait, increased standing tolerance, and increased ADL ability. Baseline: 7/3: grossly 4-/5 (see strength section) Goal  status: INITIAL  5.  Pt will ambulate 250' with LRAD and supervision with no rest breaks to allow for improved mobility within home and increased independence. Baseline: 7/3: 60.3' Goal status: INITIAL  ASSESSMENT:  CLINICAL IMPRESSION: Continued focus on strengthening BLE and endurance. Pt demonstrated continued improvement with endurance by increasing distance walked without any rest breaks. Pt with one posterior LOB at the start of ambulation, however, pt able to control descent into w/c follow. The pt will benefit from further skilled PT to increase strength and endurance to improve QoL and independence with ADLs.   OBJECTIVE IMPAIRMENTS Abnormal gait, decreased activity tolerance, decreased balance, decreased coordination, decreased endurance, decreased mobility, difficulty walking, decreased ROM, decreased strength, postural dysfunction, and obesity.   ACTIVITY LIMITATIONS carrying, lifting, bending, sitting, standing, squatting, stairs, transfers, bed mobility, dressing, and locomotion level  PARTICIPATION LIMITATIONS: cleaning, laundry, interpersonal relationship, driving, and community activity  PERSONAL FACTORS Past/current experiences, Time since onset of injury/illness/exacerbation, and 3+ comorbidities: hx of brain tumor, past CVA, HTN, obesity, arthritis  are also affecting patient's functional outcome.   REHAB POTENTIAL: Good  CLINICAL DECISION MAKING: Stable/uncomplicated  EVALUATION COMPLEXITY: Low  PLAN: PT  FREQUENCY: 2x/week  PT DURATION: 12 weeks  PLANNED INTERVENTIONS: Therapeutic exercises, Therapeutic activity, Neuromuscular re-education, Balance training, Gait training, Patient/Family education, Joint mobilization, Stair training, Vestibular training, Orthotic/Fit training, DME instructions, Dry Needling, Electrical stimulation, Wheelchair mobility training, Cryotherapy, Moist heat, Compression bandaging, Manual therapy, and Re-evaluation  PLAN FOR NEXT SESSION: strengthening, gait, endurance training, continue POC    Izola Price, SPT This entire session was performed under direct supervision and direction of a licensed Chiropractor . I have personally read, edited and approve of the note as written. Ricard Dillon PT, DPT    Zollie Pee, PT 10/15/2021, 2:43 PM

## 2021-10-17 ENCOUNTER — Ambulatory Visit: Payer: Medicare Other

## 2021-10-17 DIAGNOSIS — R296 Repeated falls: Secondary | ICD-10-CM

## 2021-10-17 DIAGNOSIS — R2681 Unsteadiness on feet: Secondary | ICD-10-CM

## 2021-10-17 DIAGNOSIS — R2689 Other abnormalities of gait and mobility: Secondary | ICD-10-CM

## 2021-10-17 DIAGNOSIS — R269 Unspecified abnormalities of gait and mobility: Secondary | ICD-10-CM | POA: Diagnosis not present

## 2021-10-17 DIAGNOSIS — M6281 Muscle weakness (generalized): Secondary | ICD-10-CM

## 2021-10-17 DIAGNOSIS — R278 Other lack of coordination: Secondary | ICD-10-CM

## 2021-10-17 DIAGNOSIS — R262 Difficulty in walking, not elsewhere classified: Secondary | ICD-10-CM

## 2021-10-17 NOTE — Therapy (Signed)
OUTPATIENT PHYSICAL THERAPY NEURO TREATMENT NOTE   Patient Name: Curtis Powell MRN: [id number] DOB:1966/03/19, 57 y.o., male Today's Date: 10/17/2021   PCP: Valera Castle, MD REFERRING PROVIDER: Valera Castle, MD   PT End of Session - 10/17/21 1326     Visit Number 65    Number of Visits 62    Date for PT Re-Evaluation 12/31/21    Authorization Type UHC Medicare; [place] Medicaid    Authorization Time Period 10/08/21-12/31/21    PT Start Time 1302    PT Stop Time 1342    PT Time Calculation (min) 40 min    Equipment Utilized During Treatment Gait belt    Activity Tolerance Patient tolerated treatment well    Behavior During Therapy WFL for tasks assessed/performed              Past Medical History:  Diagnosis Date   GERD (gastroesophageal reflux disease)    Hyperlipidemia    Hypertension    Obesity    Stroke [facility])    Past Surgical History:  Procedure Laterality Date   BRAIN SURGERY     Patient Active Problem List   Diagnosis Date Noted   Impaired glucose tolerance 03/23/2020   Learning disability 03/23/2020   Urinary and fecal incontinence 06/14/2019   Chronic pain syndrome 03/23/2019   Constipation 03/23/2019   Dysarthria 03/23/2019   Dysphagia 03/23/2019   Learning difficulty 03/23/2019   Morbid obesity ([place]) 03/23/2019   Muscle weakness 03/23/2019   Vitamin D deficiency 03/23/2019   Bilateral carotid artery stenosis 12/15/2018   Moderate aortic valve stenosis 12/15/2018   BMI 45.0-49.9, adult ([place]) 11/10/2018   Cerebrovascular accident ([place]) 05/02/2016   Hemiparesis affecting right side as late effect of cerebrovascular accident ([place]) 05/01/2016   Brain tumor ([place]) 11/24/2014   Gastro-esophageal reflux disease without esophagitis 11/24/2014   Accident due to mechanical fall without injury 08/03/2014   Essential hypertension 06/13/2014   Mixed hyperlipidemia 06/13/2014   Unspecified sequelae of cerebral infarction 06/13/2014   Thyromegaly  01/07/2014   Type 2 diabetes mellitus without complication ([place]) 40/98/1191   Neck mass 09/08/2013   Swimmer's ear 09/08/2013   Erectile dysfunction 01/03/2013   Prediabetes 01/03/2013    ONSET DATE: 05/09/2016  REFERRING DIAG: CVA (cerebral vascular accident) ([place])  THERAPY DIAG:  Abnormality of gait and mobility  Muscle weakness (generalized)  Unsteadiness on feet  Other lack of coordination  Difficulty in walking, not elsewhere classified  Repeated falls  Other abnormalities of gait and mobility  Rationale for Evaluation and Treatment Rehabilitation  SUBJECTIVE:  SUBJECTIVE STATEMENT: Pt reports feeling "fine". Denies any aches and pains. Reports no stumbles/falls. Pt reports HEP is going well.  Pt accompanied by: family member  PERTINENT HISTORY: Pt reports no changes in medical history since previously being seen in clinic for PT. Per 10/04/20 PT evaluation note: "Had a stroke in Feb 2018. He was previously fully independent, but this stroke caused severe residual deficits, mainly on the right side. He reports difficulty walking and decreased mobility. His mom had to move in with him and now is his main caregiver. Patient had outpatient PT from November 2018-March 2022. Patient reports since being discharged from outpatient therapy he has kept up with his HEP daily. He does report desire for walking better and for getting in and out of bed easier. He reports his mom is helping with some ADLs. He reports needing help with rolling in bed. He reports his mom/aide assist with dressing. He has a home aide 3 days out of the week." PMH significant for hx of brain tumor, past CVA, and HTN. See chart for additional information.  PAIN:  Are you having pain? No  PRECAUTIONS: None  WEIGHT BEARING  RESTRICTIONS No  PLOF: Independent  PATIENT GOALS: "to walk and sit-to-stand"   OBJECTIVE:   DIAGNOSTIC FINDINGS: None recent    TODAY'S TREATMENT: -MinA SPT WC to table  -LAQ 1x10 bilat, marching 1x10 -Hamstrings curls seated 1x10 bilat YTB  -heel raises seated CKC 1x15 bilat -STS 1x5, mina at sacrum, 21" high plinth, simulated Left WC arm push off  -seated marching 1x20 alternating  -STS 1x5, mina at sacrum, 21" high plinth, simulated Left WC arm push off  -seated marching 1x20 alternating  -Hamstrings curls seated 1x10 bilat YTB  -STS from WC + airex and AMB 56f, 3 minutes 30sec, 0.051m  *seated recovery interval  -STS from WC + airex and AMB [id number] minutes 30sec, 0.57m58m-STS from WC + airex pad 1x5, minguard assist     PATIENT EDUCATION: Education details: Pt educated throughout session about proper posture and technique with exercises. Improved exercise technique, movement at target joints, use of target muscles after min to mod verbal, visual, tactile cues. Person educated: Patient Education method: Explanation, Demonstration, Tactile cues, and Verbal cues Education comprehension: verbalized understanding, returned demonstration, and verbal cues required   HOME EXERCISE PROGRAM:  No updates as of 10/10/21  Instructed to continue with daily walking.    GOALS: Goals reviewed with patient? No  SHORT TERM GOALS: Target date: 11/19/2021  Pt will be independent with HEP in order to improve strength and balance in order to decrease fall risk and improve function at home. Baseline: NEW Goal status: INITIAL   LONG TERM GOALS: Target date: 12/31/2021  Pt will increase FOTO to at least 57 in order to demonstrate improvement in function related to mobility and QoL. Baseline: 7/3: 50 Goal status: INITIAL  2.  Pt (< 60 y82 old) will complete 5xSTS test < 20 seconds indicating an increase in LE strength and improved balance. Baseline: 7/3: 1min62mseconds,  Goal  status: INITIAL  3.  Pt will increase 10MWT to > 0.5 m/s (30 sec) as to improve gait speed for better home ambulation and reduce fall risk. Baseline: 7/3: 0.11 m/s with bRW Goal status: INITIAL  4.  Pt will increase BLE gross strength to 4+/5 as to improve functional strenth for independent gait, increased standing tolerance, and increased ADL ability. Baseline: 7/3: grossly 4-/5 (see strength section) Goal status: INITIAL  5.  Pt will ambulate 250' with LRAD and supervision with no rest breaks to allow for improved mobility within home and increased independence. Baseline: 7/3: 60.3' Goal status: INITIAL        Plan - 10/17/21 1328     Clinical Impression Statement Time taken for BLE resistance training to address strength deficits, repeated transfers, and AMB overground to address deficits in activity tolerance, independence, and safety with ADL mobility. Pt remains focsued, has good safety awareness but his ability to maintain trunk position control in frontal plane is still quite off from his baseline last year. Pt continues to make good progress toward goals in general, his hip extension strength for transfers remains impaired.    Personal Factors and Comorbidities Comorbidity 3+;Education;Past/Current Experience;Time since onset of injury/illness/exacerbation;Transportation    Comorbidities HTN, arthritis, past CVA, high fall risk, obesity    Examination-Activity Limitations Bed Mobility;Locomotion Level;Squat;Stairs;Stand;Toileting;Transfers    Examination-Participation Restrictions Community Activity;Driving;Cleaning;Meal Prep;Shop;Volunteer;Yard Work    Stability/Clinical Decision Making Stable/Uncomplicated    Designer, jewellery Low    Rehab Potential Good    PT Frequency 2x / week    PT Duration 8 weeks    PT Treatment/Interventions ADLs/Self Care Home Management;Cryotherapy;Moist Heat;Gait training;Stair training;Functional mobility training;Therapeutic  activities;Therapeutic exercise;Balance training;Neuromuscular re-education;Patient/family education;Orthotic Fit/Training;Passive range of motion;Energy conservation    PT Next Visit Plan Continue with progessive LE strengthening and gait/transfer training.    PT Home Exercise Plan no updates  this session; Reminder to patient to continue with daily walking                This entire session was performed under direct supervision and direction of a licensed therapist. I have personally read, edited and approve of the note as written. Ricard Dillon PT, DPT  2:05 PM, 10/17/21 Etta Grandchild, PT, DPT Physical Therapist - [facility]  Outpatient Physical Therapy- [place] [phone]     [place] C, PT 10/17/2021, 2:05 PM

## 2021-10-22 ENCOUNTER — Ambulatory Visit: Payer: Medicare Other

## 2021-10-22 DIAGNOSIS — R269 Unspecified abnormalities of gait and mobility: Secondary | ICD-10-CM

## 2021-10-22 DIAGNOSIS — R2681 Unsteadiness on feet: Secondary | ICD-10-CM

## 2021-10-22 DIAGNOSIS — M6281 Muscle weakness (generalized): Secondary | ICD-10-CM

## 2021-10-22 NOTE — Therapy (Signed)
OUTPATIENT PHYSICAL THERAPY NEURO TREATMENT NOTE   Patient Name: Curtis Powell MRN: [id number] DOB:07-Aug-1965, 56 y.o., male Today's Date: 10/22/2021   PCP: Valera Castle, MD REFERRING PROVIDER: Valera Castle, MD   PT End of Session - 10/22/21 1245     Visit Number 72    Number of Visits 92    Date for PT Re-Evaluation 12/31/21    Authorization Type UHC Medicare;  Medicaid    Authorization Time Period 10/08/21-12/31/21    PT Start Time 46    PT Stop Time 1344    PT Time Calculation (min) 44 min    Equipment Utilized During Treatment Gait belt    Activity Tolerance Patient tolerated treatment well    Behavior During Therapy WFL for tasks assessed/performed               Past Medical History:  Diagnosis Date   GERD (gastroesophageal reflux disease)    Hyperlipidemia    Hypertension    Obesity    Stroke [facility])    Past Surgical History:  Procedure Laterality Date   BRAIN SURGERY     Patient Active Problem List   Diagnosis Date Noted   Impaired glucose tolerance 03/23/2020   Learning disability 03/23/2020   Urinary and fecal incontinence 06/14/2019   Chronic pain syndrome 03/23/2019   Constipation 03/23/2019   Dysarthria 03/23/2019   Dysphagia 03/23/2019   Learning difficulty 03/23/2019   Morbid obesity ([place]) 03/23/2019   Muscle weakness 03/23/2019   Vitamin D deficiency 03/23/2019   Bilateral carotid artery stenosis 12/15/2018   Moderate aortic valve stenosis 12/15/2018   BMI 45.0-49.9, adult ([place]) 11/10/2018   Cerebrovascular accident ([place]) 05/02/2016   Hemiparesis affecting right side as late effect of cerebrovascular accident ([place]) 05/01/2016   Brain tumor ([place]) 11/24/2014   Gastro-esophageal reflux disease without esophagitis 11/24/2014   Accident due to mechanical fall without injury 08/03/2014   Essential hypertension 06/13/2014   Mixed hyperlipidemia 06/13/2014   Unspecified sequelae of cerebral infarction 06/13/2014    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   Pt accompanied by: family member  PERTINENT HISTORY: Pt reports no changes in medical history since previously being seen in clinic for PT. Per 10/04/20 PT evaluation note: "Had a stroke in Feb 2018. He was previously fully independent, but this stroke caused severe residual deficits, mainly on the right side. He reports difficulty walking and decreased mobility. His mom had to move in with him and now is his main caregiver. Patient had outpatient PT from November 2018-March 2022. Patient reports since being discharged from outpatient therapy he has kept up with his HEP daily. He does report desire for walking better and for getting in and out of bed easier. He reports his mom is helping with some ADLs. He reports needing help with rolling in bed. He reports his mom/aide assist with dressing. He has a home aide 3 days out of the week." PMH significant for hx of brain tumor, past CVA, and HTN. See chart for additional information.  PAIN:  Are you having pain? No  PRECAUTIONS: None  WEIGHT BEARING RESTRICTIONS No  PLOF: Independent  PATIENT GOALS: "to walk and sit-to-stand"   OBJECTIVE:   DIAGNOSTIC FINDINGS: None recent    TODAY'S TREATMENT:  Weave  between two cones with CGA and cues for placement of bRW.   Standing with RW with chair behind: -static stand 60 seconds with CGA  2.5 ankle weights: standing march 10x each LE  Seated: 2.5 ankle weights: -LAQ 15x each LE -march 15x each LE    -lateral step out 15x each LE   GTB hamstring curl seated 15x each LE  STS: 10x , cues for standing up all the way and hand placement when rising, CGA-minA provided by PT  Ambulation for endurance: 140' total, 1 rest break taken after 75', with bariatric walker & w/c follow, VC for standing up tall and increasing step length   PATIENT EDUCATION: Education details: Pt educated throughout session about proper posture and technique with exercises. Improved exercise technique, movement at target joints, use of target muscles after min to mod verbal, visual, tactile cues. Person educated: Patient Education method: Explanation, Demonstration, Tactile cues, and Verbal cues Education comprehension: verbalized understanding, returned demonstration, and verbal cues required   HOME EXERCISE PROGRAM:  No updates as of 10/10/21  Instructed to continue with daily walking.    GOALS: Goals reviewed with patient? No  SHORT TERM GOALS: Target date: 11/19/2021  Pt will be independent with HEP in order to improve strength and balance in order to decrease fall risk and improve function at home. Baseline: NEW Goal status: INITIAL   LONG TERM GOALS: Target date: 12/31/2021  Pt will increase FOTO to at least 57 in order to demonstrate improvement in function related to mobility and QoL. Baseline: 7/3: 50 Goal status: INITIAL  2.  Pt (< 53 yrs old) will complete 5xSTS test < 20 seconds indicating an increase in LE strength and improved balance. Baseline: 7/3: 22mn:01seconds,  Goal status: INITIAL  3.  Pt will increase 10MWT to > 0.5 m/s (30 sec) as to improve gait speed for better home ambulation and reduce fall risk. Baseline: 7/3: 0.11 m/s with  bRW Goal status: INITIAL  4.  Pt will increase BLE gross strength to 4+/5 as to improve functional strenth for independent gait, increased standing tolerance, and increased ADL ability. Baseline: 7/3: grossly 4-/5 (see strength section) Goal status: INITIAL  5.  Pt will ambulate  250' with LRAD and supervision with no rest breaks to allow for improved mobility within home and increased independence. Baseline: 7/3: 60.3' Goal status: INITIAL  ASSESSMENT:  CLINICAL IMPRESSION:  Patient tolerates standing interventions well. He is challenged with narrow cone negotiation but is improving with repetition. Prolonged ambulation requires one seated rest breaks.  The pt will benefit from further skilled PT to increase strength and endurance to improve QoL and independence with ADLs.   OBJECTIVE IMPAIRMENTS Abnormal gait, decreased activity tolerance, decreased balance, decreased coordination, decreased endurance, decreased mobility, difficulty walking, decreased ROM, decreased strength, postural dysfunction, and obesity.   ACTIVITY LIMITATIONS carrying, lifting, bending, sitting, standing, squatting, stairs, transfers, bed mobility, dressing, and locomotion level  PARTICIPATION LIMITATIONS: cleaning, laundry, interpersonal relationship, driving, and community activity  PERSONAL FACTORS Past/current experiences, Time since onset of injury/illness/exacerbation, and 3+ comorbidities: hx of brain tumor, past CVA, HTN, obesity, arthritis  are also affecting patient's functional outcome.   REHAB POTENTIAL: Good  CLINICAL DECISION MAKING: Stable/uncomplicated  EVALUATION COMPLEXITY: Low  PLAN: PT FREQUENCY: 2x/week  PT DURATION: 12 weeks  PLANNED INTERVENTIONS: Therapeutic exercises, Therapeutic activity, Neuromuscular re-education, Balance training, Gait training, Patient/Family education, Joint mobilization, Stair training, Vestibular training, Orthotic/Fit training, DME instructions, Dry  Needling, Electrical stimulation, Wheelchair mobility training, Cryotherapy, Moist heat, Compression bandaging, Manual therapy, and Re-evaluation  PLAN FOR NEXT SESSION: strengthening, gait, endurance training    Janna Arch, PT 10/22/2021, 1:45 PM

## 2021-10-23 NOTE — Therapy (Signed)
OUTPATIENT PHYSICAL THERAPY NEURO TREATMENT NOTE   Patient Name: Curtis Powell MRN: [id number] DOB:26-Jul-1965, 56 y.o., male Today's Date: 10/24/2021   PCP: Valera Castle, MD REFERRING PROVIDER: Valera Castle, MD   PT End of Session - 10/24/21 1316     Visit Number 11    Number of Visits 92    Date for PT Re-Evaluation 12/31/21    Authorization Type UHC Medicare; [place] Medicaid    Authorization Time Period 10/08/21-12/31/21    PT Start Time 1309    PT Stop Time 1345    PT Time Calculation (min) 36 min    Equipment Utilized During Treatment Gait belt    Activity Tolerance Patient tolerated treatment well    Behavior During Therapy WFL for tasks assessed/performed                Past Medical History:  Diagnosis Date   GERD (gastroesophageal reflux disease)    Hyperlipidemia    Hypertension    Obesity    Stroke [facility])    Past Surgical History:  Procedure Laterality Date   BRAIN SURGERY     Patient Active Problem List   Diagnosis Date Noted   Impaired glucose tolerance 03/23/2020   Learning disability 03/23/2020   Urinary and fecal incontinence 06/14/2019   Chronic pain syndrome 03/23/2019   Constipation 03/23/2019   Dysarthria 03/23/2019   Dysphagia 03/23/2019   Learning difficulty 03/23/2019   Morbid obesity ([place]) 03/23/2019   Muscle weakness 03/23/2019   Vitamin D deficiency 03/23/2019   Bilateral carotid artery stenosis 12/15/2018   Moderate aortic valve stenosis 12/15/2018   BMI 45.0-49.9, adult ([place]) 11/10/2018   Cerebrovascular accident ([place]) 05/02/2016   Hemiparesis affecting right side as late effect of cerebrovascular accident ([place]) 05/01/2016   Brain tumor ([place]) 11/24/2014   Gastro-esophageal reflux disease without esophagitis 11/24/2014   Accident due to mechanical fall without injury 08/03/2014   Essential hypertension 06/13/2014   Mixed hyperlipidemia 06/13/2014   Unspecified sequelae of cerebral infarction 06/13/2014    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/2014   Type 2 diabetes mellitus without complication ([place]) 79/89/2119   Neck mass 09/08/2013   Swimmer's ear 09/08/2013   Erectile dysfunction 01/03/2013   Prediabetes 01/03/2013    ONSET DATE: 05/09/2016  REFERRING DIAG: CVA (cerebral vascular accident) ([place])  THERAPY DIAG:  Abnormality of gait and mobility  Muscle weakness (generalized)  Unsteadiness on feet  Rationale for Evaluation and Treatment Rehabilitation  SUBJECTIVE:  SUBJECTIVE STATEMENT: Patient presents with mom. No falls or LOB since last session. Has been compliant with HEP.   Pt accompanied by: family member  PERTINENT HISTORY: Pt reports no changes in medical history since previously being seen in clinic for PT. Per 10/04/20 PT evaluation note: "Had a stroke in Feb 2018. He was previously fully independent, but this stroke caused severe residual deficits, mainly on the right side. He reports difficulty walking and decreased mobility. His mom had to move in with him and now is his main caregiver. Patient had outpatient PT from November 2018-March 2022. Patient reports since being discharged from outpatient therapy he has kept up with his HEP daily. He does report desire for walking better and for getting in and out of bed easier. He reports his mom is helping with some ADLs. He reports needing help with rolling in bed. He reports his mom/aide assist with dressing. He has a home aide 3 days out of the week." PMH significant for hx of brain tumor, past CVA, and HTN. See chart for additional information.  PAIN:  Are you having pain? No  PRECAUTIONS: None  WEIGHT BEARING RESTRICTIONS No  PLOF: Independent  PATIENT GOALS: "to walk and sit-to-stand"   OBJECTIVE:   DIAGNOSTIC FINDINGS: None recent    TODAY'S  TREATMENT: In // bars:  Forward/backward walk 2x length of // bars x 2 trials.  Lateral step 4x length of // bars with one seated rest break heavy BUE support, poor foot clearance  Static stand throw balls into hoop for pertubation and stabilization with prolonged standing x 6 minutes Standing march into RTB across bars with BUE support 6" step toe taps 10x each LE   Seated: Balloon taps for reaching and coordination x 3 minutes Adduction ball squeeze 10x   PATIENT EDUCATION: Education details: Pt educated throughout session about proper posture and technique with exercises. Improved exercise technique, movement at target joints, use of target muscles after min to mod verbal, visual, tactile cues. Person educated: Patient Education method: Explanation, Demonstration, Tactile cues, and Verbal cues Education comprehension: verbalized understanding, returned demonstration, and verbal cues required   HOME EXERCISE PROGRAM:  No updates as of 10/10/21  Instructed to continue with daily walking.    GOALS: Goals reviewed with patient? No  SHORT TERM GOALS: Target date: 11/19/2021  Pt will be independent with HEP in order to improve strength and balance in order to decrease fall risk and improve function at home. Baseline: NEW Goal status: INITIAL   LONG TERM GOALS: Target date: 12/31/2021  Pt will increase FOTO to at least 57 in order to demonstrate improvement in function related to mobility and QoL. Baseline: 7/3: 50 Goal status: INITIAL  2.  Pt (< 50 yrs old) will complete 5xSTS test < 20 seconds indicating an increase in LE strength and improved balance. Baseline: 7/3: 36mn:01seconds,  Goal status: INITIAL  3.  Pt will increase 10MWT to > 0.5 m/s (30 sec) as to improve gait speed for better home ambulation and reduce fall risk. Baseline: 7/3: 0.11 m/s with bRW Goal status: INITIAL  4.  Pt will increase BLE gross strength to 4+/5 as to improve functional strenth for  independent gait, increased standing tolerance, and increased ADL ability. Baseline: 7/3: grossly 4-/5 (see strength section) Goal status: INITIAL  5.  Pt will ambulate 250' with LRAD and supervision with no rest breaks to allow for improved mobility within home and increased independence. Baseline: 7/3: 60.3' Goal status: INITIAL  ASSESSMENT:  CLINICAL IMPRESSION:  Patient presents with excellent motivation. Session limited by late arrival. Tolerates progressive standing and strengthening interventions. Seated rest breaks required between each standing task.   The pt will benefit from further skilled PT to increase strength and endurance to improve QoL and independence with ADLs.   OBJECTIVE IMPAIRMENTS Abnormal gait, decreased activity tolerance, decreased balance, decreased coordination, decreased endurance, decreased mobility, difficulty walking, decreased ROM, decreased strength, postural dysfunction, and obesity.   ACTIVITY LIMITATIONS carrying, lifting, bending, sitting, standing, squatting, stairs, transfers, bed mobility, dressing, and locomotion level  PARTICIPATION LIMITATIONS: cleaning, laundry, interpersonal relationship, driving, and community activity  PERSONAL FACTORS Past/current experiences, Time since onset of injury/illness/exacerbation, and 3+ comorbidities: hx of brain tumor, past CVA, HTN, obesity, arthritis  are also affecting patient's functional outcome.   REHAB POTENTIAL: Good  CLINICAL DECISION MAKING: Stable/uncomplicated  EVALUATION COMPLEXITY: Low  PLAN: PT FREQUENCY: 2x/week  PT DURATION: 12 weeks  PLANNED INTERVENTIONS: Therapeutic exercises, Therapeutic activity, Neuromuscular re-education, Balance training, Gait training, Patient/Family education, Joint mobilization, Stair training, Vestibular training, Orthotic/Fit training, DME instructions, Dry Needling, Electrical stimulation, Wheelchair mobility training, Cryotherapy, Moist heat, Compression  bandaging, Manual therapy, and Re-evaluation  PLAN FOR NEXT SESSION: strengthening, gait, endurance training    Janna Arch, PT 10/24/2021, 1:45 PM

## 2021-10-24 ENCOUNTER — Ambulatory Visit: Payer: Medicare Other

## 2021-10-24 DIAGNOSIS — M6281 Muscle weakness (generalized): Secondary | ICD-10-CM

## 2021-10-24 DIAGNOSIS — R2681 Unsteadiness on feet: Secondary | ICD-10-CM

## 2021-10-24 DIAGNOSIS — R269 Unspecified abnormalities of gait and mobility: Secondary | ICD-10-CM

## 2021-10-25 NOTE — Therapy (Signed)
OUTPATIENT PHYSICAL THERAPY NEURO TREATMENT NOTE   Patient Name: Curtis Powell MRN: [id number] DOB:06/22/1965, 56 y.o., male Today's Date: 10/29/2021   PCP: Valera Castle, MD REFERRING PROVIDER: Valera Castle, MD   PT End of Session - 10/29/21 1301     Visit Number 38    Number of Visits 86    Date for PT Re-Evaluation 12/31/21    Authorization Type UHC Medicare; [place] Medicaid    Authorization Time Period 10/08/21-12/31/21    PT Start Time 38    PT Stop Time 1344    PT Time Calculation (min) 44 min    Equipment Utilized During Treatment Gait belt    Activity Tolerance Patient tolerated treatment well    Behavior During Therapy WFL for tasks assessed/performed                 Past Medical History:  Diagnosis Date   GERD (gastroesophageal reflux disease)    Hyperlipidemia    Hypertension    Obesity    Stroke [facility])    Past Surgical History:  Procedure Laterality Date   BRAIN SURGERY     Patient Active Problem List   Diagnosis Date Noted   Impaired glucose tolerance 03/23/2020   Learning disability 03/23/2020   Urinary and fecal incontinence 06/14/2019   Chronic pain syndrome 03/23/2019   Constipation 03/23/2019   Dysarthria 03/23/2019   Dysphagia 03/23/2019   Learning difficulty 03/23/2019   Morbid obesity ([place]) 03/23/2019   Muscle weakness 03/23/2019   Vitamin D deficiency 03/23/2019   Bilateral carotid artery stenosis 12/15/2018   Moderate aortic valve stenosis 12/15/2018   BMI 45.0-49.9, adult ([place]) 11/10/2018   Cerebrovascular accident ([place]) 05/02/2016   Hemiparesis affecting right side as late effect of cerebrovascular accident ([place]) 05/01/2016   Brain tumor ([place]) 11/24/2014   Gastro-esophageal reflux disease without esophagitis 11/24/2014   Accident due to mechanical fall without injury 08/03/2014   Essential hypertension 06/13/2014   Mixed hyperlipidemia 06/13/2014   Unspecified sequelae of cerebral infarction 06/13/2014    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/2014   Type 2 diabetes mellitus without complication ([name]) 63/87/5643   Neck mass 09/08/2013   Swimmer's ear 09/08/2013   Erectile dysfunction 01/03/2013   Prediabetes 01/03/2013    ONSET DATE: 05/09/2016  REFERRING DIAG: CVA (cerebral vascular accident) ([place])  THERAPY DIAG:  Abnormality of gait and mobility  Muscle weakness (generalized)  Unsteadiness on feet  Rationale for Evaluation and Treatment Rehabilitation  SUBJECTIVE:  SUBJECTIVE STATEMENT: Patient reports having a good weekend. No stumbles or falls since last session.   Pt accompanied by: family member  PERTINENT HISTORY: Pt reports no changes in medical history since previously being seen in clinic for PT. Per 10/04/20 PT evaluation note: "Had a stroke in Feb 2018. He was previously fully independent, but this stroke caused severe residual deficits, mainly on the right side. He reports difficulty walking and decreased mobility. His mom had to move in with him and now is his main caregiver. Patient had outpatient PT from November 2018-March 2022. Patient reports since being discharged from outpatient therapy he has kept up with his HEP daily. He does report desire for walking better and for getting in and out of bed easier. He reports his mom is helping with some ADLs. He reports needing help with rolling in bed. He reports his mom/aide assist with dressing. He has a home aide 3 days out of the week." PMH significant for hx of brain tumor, past CVA, and HTN. See chart for additional information.  PAIN:  Are you having pain? No  PRECAUTIONS: None  WEIGHT BEARING RESTRICTIONS No  PLOF: Independent  PATIENT GOALS: "to walk and sit-to-stand"   OBJECTIVE:   DIAGNOSTIC FINDINGS: None recent    TODAY'S  TREATMENT: In // bars:  Forward/backward walk 2x length of // bars x 3 trials.  Lateral step 4x length of // bars with one seated rest break heavy BUE support, poor foot clearance  6" step toe taps 10x each LE   Standing with 2.5 ankle weight: -march 10x each LE -hip extension 10x each LE  Seated: 2.5 ankle weight:  -march 15x each LE -LAQ 15x each LE   PATIENT EDUCATION: Education details: Pt educated throughout session about proper posture and technique with exercises. Improved exercise technique, movement at target joints, use of target muscles after min to mod verbal, visual, tactile cues. Person educated: Patient Education method: Explanation, Demonstration, Tactile cues, and Verbal cues Education comprehension: verbalized understanding, returned demonstration, and verbal cues required   HOME EXERCISE PROGRAM:  No updates as of 10/10/21  Instructed to continue with daily walking.    GOALS: Goals reviewed with patient? No  SHORT TERM GOALS: Target date: 11/19/2021  Pt will be independent with HEP in order to improve strength and balance in order to decrease fall risk and improve function at home. Baseline: NEW Goal status: INITIAL   LONG TERM GOALS: Target date: 12/31/2021  Pt will increase FOTO to at least 57 in order to demonstrate improvement in function related to mobility and QoL. Baseline: 7/3: 50 Goal status: INITIAL  2.  Pt (< 7 yrs old) will complete 5xSTS test < 20 seconds indicating an increase in LE strength and improved balance. Baseline: 7/3: 6mn:01seconds,  Goal status: INITIAL  3.  Pt will increase 10MWT to > 0.5 m/s (30 sec) as to improve gait speed for better home ambulation and reduce fall risk. Baseline: 7/3: 0.11 m/s with bRW Goal status: INITIAL  4.  Pt will increase BLE gross strength to 4+/5 as to improve functional strenth for independent gait, increased standing tolerance, and increased ADL ability. Baseline: 7/3: grossly 4-/5 (see  strength section) Goal status: INITIAL  5.  Pt will ambulate 250' with LRAD and supervision with no rest breaks to allow for improved mobility within home and increased independence. Baseline: 7/3: 60.3' Goal status: INITIAL  ASSESSMENT:  CLINICAL IMPRESSION:  Patient presents with excellent motivation. He requires intermittent seated rest breaks  due to fatigue between interventions. Poor posterior chain control continues to be present as well as heavy reliance upon Ue's.   The pt will benefit from further skilled PT to increase strength and endurance to improve QoL and independence with ADLs.   OBJECTIVE IMPAIRMENTS Abnormal gait, decreased activity tolerance, decreased balance, decreased coordination, decreased endurance, decreased mobility, difficulty walking, decreased ROM, decreased strength, postural dysfunction, and obesity.   ACTIVITY LIMITATIONS carrying, lifting, bending, sitting, standing, squatting, stairs, transfers, bed mobility, dressing, and locomotion level  PARTICIPATION LIMITATIONS: cleaning, laundry, interpersonal relationship, driving, and community activity  PERSONAL FACTORS Past/current experiences, Time since onset of injury/illness/exacerbation, and 3+ comorbidities: hx of brain tumor, past CVA, HTN, obesity, arthritis  are also affecting patient's functional outcome.   REHAB POTENTIAL: Good  CLINICAL DECISION MAKING: Stable/uncomplicated  EVALUATION COMPLEXITY: Low  PLAN: PT FREQUENCY: 2x/week  PT DURATION: 12 weeks  PLANNED INTERVENTIONS: Therapeutic exercises, Therapeutic activity, Neuromuscular re-education, Balance training, Gait training, Patient/Family education, Joint mobilization, Stair training, Vestibular training, Orthotic/Fit training, DME instructions, Dry Needling, Electrical stimulation, Wheelchair mobility training, Cryotherapy, Moist heat, Compression bandaging, Manual therapy, and Re-evaluation  PLAN FOR NEXT SESSION: strengthening, gait,  endurance training    Janna Arch, PT 10/29/2021, 1:44 PM

## 2021-10-29 ENCOUNTER — Ambulatory Visit: Payer: Medicare Other

## 2021-10-29 DIAGNOSIS — M6281 Muscle weakness (generalized): Secondary | ICD-10-CM

## 2021-10-29 DIAGNOSIS — R269 Unspecified abnormalities of gait and mobility: Secondary | ICD-10-CM

## 2021-10-29 DIAGNOSIS — R2681 Unsteadiness on feet: Secondary | ICD-10-CM

## 2021-10-31 ENCOUNTER — Ambulatory Visit: Payer: Medicare Other | Admitting: Physical Therapy

## 2021-10-31 ENCOUNTER — Encounter: Payer: Self-pay | Admitting: Physical Therapy

## 2021-10-31 DIAGNOSIS — R278 Other lack of coordination: Secondary | ICD-10-CM

## 2021-10-31 DIAGNOSIS — R531 Weakness: Secondary | ICD-10-CM

## 2021-10-31 DIAGNOSIS — R269 Unspecified abnormalities of gait and mobility: Secondary | ICD-10-CM

## 2021-10-31 DIAGNOSIS — R262 Difficulty in walking, not elsewhere classified: Secondary | ICD-10-CM

## 2021-10-31 DIAGNOSIS — R2689 Other abnormalities of gait and mobility: Secondary | ICD-10-CM

## 2021-10-31 DIAGNOSIS — R296 Repeated falls: Secondary | ICD-10-CM

## 2021-10-31 DIAGNOSIS — R2681 Unsteadiness on feet: Secondary | ICD-10-CM

## 2021-10-31 DIAGNOSIS — M6281 Muscle weakness (generalized): Secondary | ICD-10-CM

## 2021-10-31 NOTE — Therapy (Signed)
OUTPATIENT PHYSICAL THERAPY NEURO TREATMENT NOTE   Patient Name: Curtis Powell MRN: [id number] DOB:19-Jun-1965, 56 y.o., male Today's Date: 10/31/2021   PCP: Valera Castle, MD REFERRING PROVIDER: Valera Castle, MD   PT End of Session - 10/31/21 1310     Visit Number 18    Number of Visits 30    Date for PT Re-Evaluation 12/31/21    Authorization Type UHC Medicare; [place] Medicaid    Authorization Time Period 10/08/21-12/31/21    PT Start Time 1305    PT Stop Time 1345    PT Time Calculation (min) 40 min    Equipment Utilized During Treatment Gait belt    Activity Tolerance Patient tolerated treatment well    Behavior During Therapy WFL for tasks assessed/performed                 Past Medical History:  Diagnosis Date   GERD (gastroesophageal reflux disease)    Hyperlipidemia    Hypertension    Obesity    Stroke [facility])    Past Surgical History:  Procedure Laterality Date   BRAIN SURGERY     Patient Active Problem List   Diagnosis Date Noted   Impaired glucose tolerance 03/23/2020   Learning disability 03/23/2020   Urinary and fecal incontinence 06/14/2019   Chronic pain syndrome 03/23/2019   Constipation 03/23/2019   Dysarthria 03/23/2019   Dysphagia 03/23/2019   Learning difficulty 03/23/2019   Morbid obesity ([place]) 03/23/2019   Muscle weakness 03/23/2019   Vitamin D deficiency 03/23/2019   Bilateral carotid artery stenosis 12/15/2018   Moderate aortic valve stenosis 12/15/2018   BMI 45.0-49.9, adult ([place]) 11/10/2018   Cerebrovascular accident ([place]) 05/02/2016   Hemiparesis affecting right side as late effect of cerebrovascular accident ([place]) 05/01/2016   Brain tumor ([place]) 11/24/2014   Gastro-esophageal reflux disease without esophagitis 11/24/2014   Accident due to mechanical fall without injury 08/03/2014   Essential hypertension 06/13/2014   Mixed hyperlipidemia 06/13/2014   Unspecified sequelae of cerebral infarction 06/13/2014    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/2014   Type 2 diabetes mellitus without complication ([place]) 85/46/2703   Neck mass 09/08/2013   Swimmer's ear 09/08/2013   Erectile dysfunction 01/03/2013   Prediabetes 01/03/2013    ONSET DATE: 05/09/2016  REFERRING DIAG: CVA (cerebral vascular accident) ([place])  THERAPY DIAG:  Abnormality of gait and mobility  Muscle weakness (generalized)  Unsteadiness on feet  Other lack of coordination  Difficulty in walking, not elsewhere classified  Repeated falls  Other abnormalities of gait and mobility  Left-sided weakness  Rationale for Evaluation and Treatment Rehabilitation  SUBJECTIVE:  SUBJECTIVE STATEMENT: Patient reports having a good weekend. No stumbles or falls since last session.   Pt accompanied by: family member  PERTINENT HISTORY: Pt reports no changes in medical history since previously being seen in clinic for PT. Per 10/04/20 PT evaluation note: "Had a stroke in Feb 2018. He was previously fully independent, but this stroke caused severe residual deficits, mainly on the right side. He reports difficulty walking and decreased mobility. His mom had to move in with him and now is his main caregiver. Patient had outpatient PT from November 2018-March 2022. Patient reports since being discharged from outpatient therapy he has kept up with his HEP daily. He does report desire for walking better and for getting in and out of bed easier. He reports his mom is helping with some ADLs. He reports needing help with rolling in bed. He reports his mom/aide assist with dressing. He has a home aide 3 days out of the week." PMH significant for hx of brain tumor, past CVA, and HTN. See chart for additional information.  PAIN:  Are you having pain? No  PRECAUTIONS: None  WEIGHT  BEARING RESTRICTIONS No  PLOF: Independent  PATIENT GOALS: "to walk and sit-to-stand"   OBJECTIVE:   DIAGNOSTIC FINDINGS: None recent    TODAY'S TREATMENT:  Seated: 5# ankle weight:  -march 15x each LE -LAQ 15x each LE Pt seated away from back of chair with cues for erect posture to challenge trunk control;   In // bars:  Forward/backward walk 2x length of // bars x 1 trials with 5# ankle weight; Patient exhibits significant difficulty with backward walking with decreased hip extension activation; Required mod VCs for erect posture; Exhibits heavy UE use and lateral weight shift.  Lateral step 2x length of // bars with 5# ankle weight; Exhibits heavy UE use requiring cues for proper foot placement. Patient exhibit heavy flexed posture having difficulty activating gluteal muscles;   6" step toe taps 2 sets of 10x each LE with 5# ankle weight  Standing in parallel bars: Unsupported 30 sec x2 sets with cues for neutral weight shift, erect posture, required CGA for safety; Pt reports minimal difficulty but does have difficulty maintaining erect posture;   Pt tolerated well. He does report increased fatigue. He did require several seated rest  breaks throughout session due to fatigue;    PATIENT EDUCATION: Education details: Pt educated throughout session about proper posture and technique with exercises. Improved exercise technique, movement at target joints, use of target muscles after min to mod verbal, visual, tactile cues. Person educated: Patient Education method: Explanation, Demonstration, Tactile cues, and Verbal cues Education comprehension: verbalized understanding, returned demonstration, and verbal cues required   HOME EXERCISE PROGRAM:  No updates as of 10/31/21  Instructed to continue with daily walking.    GOALS: Goals reviewed with patient? No  SHORT TERM GOALS: Target date: 11/19/2021  Pt will be independent with HEP in order to improve strength and  balance in order to decrease fall risk and improve function at home. Baseline: NEW Goal status: INITIAL   LONG TERM GOALS: Target date: 12/31/2021  Pt will increase FOTO to at least 57 in order to demonstrate improvement in function related to mobility and QoL. Baseline: 7/3: 50 Goal status: INITIAL  2.  Pt (< 73 yrs old) will complete 5xSTS test < 20 seconds indicating an increase in LE strength and improved balance. Baseline: 7/3: 56mn:01seconds,  Goal status: INITIAL  3.  Pt will increase 10MWT to >  0.5 m/s (30 sec) as to improve gait speed for better home ambulation and reduce fall risk. Baseline: 7/3: 0.11 m/s with bRW Goal status: INITIAL  4.  Pt will increase BLE gross strength to 4+/5 as to improve functional strenth for independent gait, increased standing tolerance, and increased ADL ability. Baseline: 7/3: grossly 4-/5 (see strength section) Goal status: INITIAL  5.  Pt will ambulate 250' with LRAD and supervision with no rest breaks to allow for improved mobility within home and increased independence. Baseline: 7/3: 60.3' Goal status: INITIAL  ASSESSMENT:  CLINICAL IMPRESSION:  Patient presents with excellent motivation. He requires intermittent seated rest breaks due to fatigue between interventions. Poor posterior chain control continues to be present as well as heavy reliance upon Ue's.  He required min VCs for proper exercise technique. Progressed LE strengthening with increased resistance this session. Patient tolerated well, but does fatigue quickly;  The pt will benefit from further skilled PT to increase strength and endurance to improve QoL and independence with ADLs.   OBJECTIVE IMPAIRMENTS Abnormal gait, decreased activity tolerance, decreased balance, decreased coordination, decreased endurance, decreased mobility, difficulty walking, decreased ROM, decreased strength, postural dysfunction, and obesity.   ACTIVITY LIMITATIONS carrying, lifting, bending,  sitting, standing, squatting, stairs, transfers, bed mobility, dressing, and locomotion level  PARTICIPATION LIMITATIONS: cleaning, laundry, interpersonal relationship, driving, and community activity  PERSONAL FACTORS Past/current experiences, Time since onset of injury/illness/exacerbation, and 3+ comorbidities: hx of brain tumor, past CVA, HTN, obesity, arthritis  are also affecting patient's functional outcome.   REHAB POTENTIAL: Good  CLINICAL DECISION MAKING: Stable/uncomplicated  EVALUATION COMPLEXITY: Low  PLAN: PT FREQUENCY: 2x/week  PT DURATION: 12 weeks  PLANNED INTERVENTIONS: Therapeutic exercises, Therapeutic activity, Neuromuscular re-education, Balance training, Gait training, Patient/Family education, Joint mobilization, Stair training, Vestibular training, Orthotic/Fit training, DME instructions, Dry Needling, Electrical stimulation, Wheelchair mobility training, Cryotherapy, Moist heat, Compression bandaging, Manual therapy, and Re-evaluation  PLAN FOR NEXT SESSION: strengthening, gait, endurance training    Cheyrl Buley, PT, DPT 10/31/2021, 1:43 PM

## 2021-11-05 ENCOUNTER — Encounter: Payer: Self-pay | Admitting: Physical Therapy

## 2021-11-05 ENCOUNTER — Ambulatory Visit: Payer: Medicare Other | Admitting: Physical Therapy

## 2021-11-05 DIAGNOSIS — R2681 Unsteadiness on feet: Secondary | ICD-10-CM

## 2021-11-05 DIAGNOSIS — R278 Other lack of coordination: Secondary | ICD-10-CM

## 2021-11-05 DIAGNOSIS — R269 Unspecified abnormalities of gait and mobility: Secondary | ICD-10-CM

## 2021-11-05 DIAGNOSIS — R262 Difficulty in walking, not elsewhere classified: Secondary | ICD-10-CM

## 2021-11-05 DIAGNOSIS — R296 Repeated falls: Secondary | ICD-10-CM

## 2021-11-05 DIAGNOSIS — M6281 Muscle weakness (generalized): Secondary | ICD-10-CM

## 2021-11-05 DIAGNOSIS — R2689 Other abnormalities of gait and mobility: Secondary | ICD-10-CM

## 2021-11-05 NOTE — Therapy (Signed)
OUTPATIENT PHYSICAL THERAPY NEURO TREATMENT NOTE   Patient Name: Curtis Powell MRN: [id number] DOB:01-15-1966, 56 y.o., male Today's Date: 11/05/2021   PCP: Valera Castle, MD REFERRING PROVIDER: Valera Castle, MD   PT End of Session - 11/05/21 1248     Visit Number 88    Number of Visits 69    Date for PT Re-Evaluation 12/31/21    Authorization Type UHC Medicare; [place] Medicaid    Authorization Time Period 10/08/21-12/31/21    PT Start Time 1301    PT Stop Time 1342    PT Time Calculation (min) 41 min    Equipment Utilized During Treatment Gait belt    Activity Tolerance Patient tolerated treatment well    Behavior During Therapy WFL for tasks assessed/performed                 Past Medical History:  Diagnosis Date   GERD (gastroesophageal reflux disease)    Hyperlipidemia    Hypertension    Obesity    Stroke [facility])    Past Surgical History:  Procedure Laterality Date   BRAIN SURGERY     Patient Active Problem List   Diagnosis Date Noted   Impaired glucose tolerance 03/23/2020   Learning disability 03/23/2020   Urinary and fecal incontinence 06/14/2019   Chronic pain syndrome 03/23/2019   Constipation 03/23/2019   Dysarthria 03/23/2019   Dysphagia 03/23/2019   Learning difficulty 03/23/2019   Morbid obesity ([place]) 03/23/2019   Muscle weakness 03/23/2019   Vitamin D deficiency 03/23/2019   Bilateral carotid artery stenosis 12/15/2018   Moderate aortic valve stenosis 12/15/2018   BMI 45.0-49.9, adult ([place]) 11/10/2018   Cerebrovascular accident ([place]) 05/02/2016   Hemiparesis affecting right side as late effect of cerebrovascular accident ([place]) 05/01/2016   Brain tumor ([place]) 11/24/2014   Gastro-esophageal reflux disease without esophagitis 11/24/2014   Accident due to mechanical fall without injury 08/03/2014   Essential hypertension 06/13/2014   Mixed hyperlipidemia 06/13/2014   Unspecified sequelae of cerebral infarction 06/13/2014    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/2014   Type 2 diabetes mellitus without complication ([place]) 84/66/5993   Neck mass 09/08/2013   Swimmer's ear 09/08/2013   Erectile dysfunction 01/03/2013   Prediabetes 01/03/2013    ONSET DATE: 05/09/2016  REFERRING DIAG: CVA (cerebral vascular accident) ([place])  THERAPY DIAG:  Abnormality of gait and mobility  Muscle weakness (generalized)  Unsteadiness on feet  Other lack of coordination  Difficulty in walking, not elsewhere classified  Repeated falls  Other abnormalities of gait and mobility  Rationale for Evaluation and Treatment Rehabilitation  SUBJECTIVE:  SUBJECTIVE STATEMENT: Patient reports having a good weekend. No stumbles or falls since last session.   Pt accompanied by: family member  PERTINENT HISTORY: Pt reports no changes in medical history since previously being seen in clinic for PT. Per 10/04/20 PT evaluation note: "Had a stroke in Feb 2018. He was previously fully independent, but this stroke caused severe residual deficits, mainly on the right side. He reports difficulty walking and decreased mobility. His mom had to move in with him and now is his main caregiver. Patient had outpatient PT from November 2018-March 2022. Patient reports since being discharged from outpatient therapy he has kept up with his HEP daily. He does report desire for walking better and for getting in and out of bed easier. He reports his mom is helping with some ADLs. He reports needing help with rolling in bed. He reports his mom/aide assist with dressing. He has a home aide 3 days out of the week." PMH significant for hx of brain tumor, past CVA, and HTN. See chart for additional information.  PAIN:  Are you having pain? No  PRECAUTIONS: None  WEIGHT BEARING RESTRICTIONS  No  PLOF: Independent  PATIENT GOALS: "to walk and sit-to-stand"   OBJECTIVE:   DIAGNOSTIC FINDINGS: None recent    TODAY'S TREATMENT:  Seated: 5# ankle weight:  -march 10x each LE -LAQ 20x each LE Pt seated away from back of chair with cues for erect posture to challenge trunk control;   In // bars:  Forward/backward walk 5x length of // bars x 1 trials with 5# ankle weight; Patient exhibits significant difficulty with backward walking with decreased hip extension activation; Required mod VCs for erect posture; Exhibits heavy UE use and lateral weight shift.  Lateral step 2x length of // bars with 5# ankle weight; Exhibits heavy UE use requiring cues for proper foot placement. Patient exhibit heavy flexed posture having difficulty activating gluteal muscles; He also exhibits increased compensation with turning foot outward due to weakness in hip abductors;   Attempted stepping over 1/2 bolster with 5# ankle weights (able to lift LE over bolster but unable to take a full step to clear bolster) x5 reps each LE with min A for safety;  Standing on firm surface: -BUE wand flexion x10 reps with mod VCS for erect posture to increase gluteal activation and increase trunk extension; Required min A for safety and positioning;   Standing in parallel bars: Unsupported 30 sec x1 sets with feet apart  Progressed to staggered stance unsupported 30 sec x1 set each foot in front;  Pt required min VCs cues for neutral weight shift, erect posture, required CGA for safety; Pt reports minimal difficulty but does have difficulty maintaining erect posture;   Pt tolerated well. He does report increased fatigue. He did require several seated rest  breaks throughout session due to fatigue;    PATIENT EDUCATION: Education details: Pt educated throughout session about proper posture and technique with exercises. Improved exercise technique, movement at target joints, use of target muscles after min to mod  verbal, visual, tactile cues. Person educated: Patient Education method: Explanation, Demonstration, Tactile cues, and Verbal cues Education comprehension: verbalized understanding, returned demonstration, and verbal cues required   HOME EXERCISE PROGRAM:  No updates as of 10/31/21  Instructed to continue with daily walking.    GOALS: Goals reviewed with patient? No  SHORT TERM GOALS: Target date: 11/19/2021  Pt will be independent with HEP in order to improve strength and balance in order to  decrease fall risk and improve function at home. Baseline: NEW Goal status: INITIAL   LONG TERM GOALS: Target date: 12/31/2021  Pt will increase FOTO to at least 57 in order to demonstrate improvement in function related to mobility and QoL. Baseline: 7/3: 50 Goal status: INITIAL  2.  Pt (< 34 yrs old) will complete 5xSTS test < 20 seconds indicating an increase in LE strength and improved balance. Baseline: 7/3: 47mn:01seconds,  Goal status: INITIAL  3.  Pt will increase 10MWT to > 0.5 m/s (30 sec) as to improve gait speed for better home ambulation and reduce fall risk. Baseline: 7/3: 0.11 m/s with bRW Goal status: INITIAL  4.  Pt will increase BLE gross strength to 4+/5 as to improve functional strenth for independent gait, increased standing tolerance, and increased ADL ability. Baseline: 7/3: grossly 4-/5 (see strength section) Goal status: INITIAL  5.  Pt will ambulate 250' with LRAD and supervision with no rest breaks to allow for improved mobility within home and increased independence. Baseline: 7/3: 60.3' Goal status: INITIAL  ASSESSMENT:  CLINICAL IMPRESSION:  Patient presents with excellent motivation. He requires intermittent seated rest breaks due to fatigue between interventions. Poor posterior chain control continues to be present as well as heavy reliance upon Ue's.  He required min VCs for proper exercise technique. Progressed LE strengthening with increased  repetition this session. Patient tolerated well, but does fatigue quickly;  The pt will benefit from further skilled PT to increase strength and endurance to improve QoL and independence with ADLs.   OBJECTIVE IMPAIRMENTS Abnormal gait, decreased activity tolerance, decreased balance, decreased coordination, decreased endurance, decreased mobility, difficulty walking, decreased ROM, decreased strength, postural dysfunction, and obesity.   ACTIVITY LIMITATIONS carrying, lifting, bending, sitting, standing, squatting, stairs, transfers, bed mobility, dressing, and locomotion level  PARTICIPATION LIMITATIONS: cleaning, laundry, interpersonal relationship, driving, and community activity  PERSONAL FACTORS Past/current experiences, Time since onset of injury/illness/exacerbation, and 3+ comorbidities: hx of brain tumor, past CVA, HTN, obesity, arthritis  are also affecting patient's functional outcome.   REHAB POTENTIAL: Good  CLINICAL DECISION MAKING: Stable/uncomplicated  EVALUATION COMPLEXITY: Low  PLAN: PT FREQUENCY: 2x/week  PT DURATION: 12 weeks  PLANNED INTERVENTIONS: Therapeutic exercises, Therapeutic activity, Neuromuscular re-education, Balance training, Gait training, Patient/Family education, Joint mobilization, Stair training, Vestibular training, Orthotic/Fit training, DME instructions, Dry Needling, Electrical stimulation, Wheelchair mobility training, Cryotherapy, Moist heat, Compression bandaging, Manual therapy, and Re-evaluation  PLAN FOR NEXT SESSION: strengthening, gait, endurance training    Mesa Janus, PT, DPT 11/05/2021, 1:46 PM

## 2021-11-07 ENCOUNTER — Ambulatory Visit: Payer: Medicare Other

## 2021-11-10 ENCOUNTER — Emergency Department
Admission: EM | Admit: 2021-11-10 | Discharge: 2021-11-10 | Disposition: A | Discharge status: Home / Self Care | Payer: Medicare Other | Attending: Emergency Medicine | Admitting: Emergency Medicine

## 2021-11-10 ENCOUNTER — Other Ambulatory Visit: Payer: Self-pay

## 2021-11-10 ENCOUNTER — Emergency Department: Payer: Medicare Other

## 2021-11-10 DIAGNOSIS — Z20822 Contact with and (suspected) exposure to covid-19: Secondary | ICD-10-CM | POA: Insufficient documentation

## 2021-11-10 DIAGNOSIS — E119 Type 2 diabetes mellitus without complications: Secondary | ICD-10-CM | POA: Insufficient documentation

## 2021-11-10 DIAGNOSIS — Z7902 Long term (current) use of antithrombotics/antiplatelets: Secondary | ICD-10-CM | POA: Diagnosis not present

## 2021-11-10 DIAGNOSIS — J40 Bronchitis, not specified as acute or chronic: Secondary | ICD-10-CM | POA: Diagnosis not present

## 2021-11-10 DIAGNOSIS — R791 Abnormal coagulation profile: Secondary | ICD-10-CM | POA: Insufficient documentation

## 2021-11-10 DIAGNOSIS — R0602 Shortness of breath: Secondary | ICD-10-CM | POA: Diagnosis present

## 2021-11-10 HISTORY — DX: Unspecified asthma, uncomplicated: J45.909

## 2021-11-10 LAB — COMPREHENSIVE METABOLIC PANEL
ALT: 22 U/L (ref 0–44)
AST: 30 U/L (ref 15–41)
Albumin: 4 g/dL (ref 3.5–5.0)
Alkaline Phosphatase: 62 U/L (ref 38–126)
Anion gap: 9 (ref 5–15)
BUN: 8 mg/dL (ref 6–20)
CO2: 26 mmol/L (ref 22–32)
Calcium: 9.1 mg/dL (ref 8.9–10.3)
Chloride: 105 mmol/L (ref 98–111)
Creatinine, Ser: 1.14 mg/dL (ref 0.61–1.24)
GFR, Estimated: >60 mL/min (ref >60)
Glucose, Bld: 123 mg/dL — ABNORMAL HIGH (ref 70–99)
Potassium: 4 mmol/L (ref 3.5–5.1)
Sodium: 140 mmol/L (ref 135–145)
Total Bilirubin: 0.9 mg/dL (ref 0.3–1.2)
Total Protein: 7.7 g/dL (ref 6.5–8.1)

## 2021-11-10 LAB — CBC WITH DIFFERENTIAL/PLATELET
Abs Immature Granulocytes: 0.03 10*3/uL (ref 0.00–0.07)
Basophils Absolute: 0 10*3/uL (ref 0.0–0.1)
Basophils Relative: 0 %
Eosinophils Absolute: 0.3 10*3/uL (ref 0.0–0.5)
Eosinophils Relative: 4 %
HCT: 42.9 % (ref 39.0–52.0)
Hemoglobin: 13.1 g/dL (ref 13.0–17.0)
Immature Granulocytes: 0 %
Lymphocytes Relative: 32 %
Lymphs Abs: 2.4 10*3/uL (ref 0.7–4.0)
MCH: 25.8 pg — ABNORMAL LOW (ref 26.0–34.0)
MCHC: 30.5 g/dL (ref 30.0–36.0)
MCV: 84.4 fL (ref 80.0–100.0)
Monocytes Absolute: 0.8 10*3/uL (ref 0.1–1.0)
Monocytes Relative: 11 %
Neutro Abs: 4 10*3/uL (ref 1.7–7.7)
Neutrophils Relative %: 53 %
Platelets: 223 10*3/uL (ref 150–400)
RBC: 5.08 MIL/uL (ref 4.22–5.81)
RDW: 16.3 % — ABNORMAL HIGH (ref 11.5–15.5)
WBC: 7.7 10*3/uL (ref 4.0–10.5)
nRBC: 0 % (ref 0.0–0.2)

## 2021-11-10 LAB — BRAIN NATRIURETIC PEPTIDE: B Natriuretic Peptide: 7.6 pg/mL (ref 0.0–100.0)

## 2021-11-10 LAB — URINALYSIS, ROUTINE W REFLEX MICROSCOPIC
Bilirubin Urine: NEGATIVE
Glucose, UA: NEGATIVE mg/dL
Hgb urine dipstick: NEGATIVE
Ketones, ur: NEGATIVE mg/dL
Leukocytes,Ua: NEGATIVE
Nitrite: NEGATIVE
Protein, ur: NEGATIVE mg/dL
Specific Gravity, Urine: 1.009 (ref 1.005–1.030)
pH: 7 (ref 5.0–8.0)

## 2021-11-10 LAB — D-DIMER, QUANTITATIVE: D-Dimer, Quant: 0.73 ug/mL-FEU — ABNORMAL HIGH (ref 0.00–0.50)

## 2021-11-10 LAB — SARS CORONAVIRUS 2 BY RT PCR: SARS Coronavirus 2 by RT PCR: NEGATIVE

## 2021-11-10 LAB — TROPONIN I (HIGH SENSITIVITY)
Troponin I (High Sensitivity): 4 ng/L (ref <18)
Troponin I (High Sensitivity): 3 ng/L (ref <18)

## 2021-11-10 LAB — PROCALCITONIN: Procalcitonin: <0.1 ng/mL

## 2021-11-10 MED ORDER — PREDNISONE 20 MG PO TABS: 40.0000 mg | ORAL_TABLET | Freq: Every day | ORAL | 0 refills | Status: AC

## 2021-11-10 MED ORDER — METHYLPREDNISOLONE SODIUM SUCC 125 MG IJ SOLR
125.0000 mg | Freq: Once | INTRAMUSCULAR | Status: AC
  Administered 2021-11-10: 125 mg via INTRAVENOUS
  Filled 2021-11-10: qty 2

## 2021-11-10 MED ORDER — IOHEXOL 350 MG/ML SOLN
100.0000 mL | Freq: Once | INTRAVENOUS | Status: AC | PRN
  Administered 2021-11-10: 100 mL via INTRAVENOUS

## 2021-11-10 NOTE — ED Provider Notes (Addendum)
[facility] Provider Note    Event Date/Time   First MD Initiated Contact with Patient 11/10/21 0915     (approximate)   History   Shortness of Breath   HPI  Curtis Powell is a 56 y.o. male with diabetes, wheelchair-bound due to stroke who comes in with shortness of breath that started last night coughing over the past 2 days.  However then today they noted more shortness of breath.  Patient is unable to ambulate due to prior stroke affecting his right side.  He lives with his mother.  He denies any abdominal pain.  Denies any falls.  Denies any worsening leg swelling.  Patient is on Plavix only.  No recent admissions for asthma exacerbation.  Does have a history of moderate aortic valve stenosis on review of cardiology note in November 2022.  Patient's family not sure if he has been diagnosed with asthma in the past that he does have an albuterol nebulizer that has had over the past 3 to 4 years that he takes for wheezing and shortness of breath.  They do report a lot of pollen and some coughing and congestion recently  Please so patient was given 2 albuterol nebs at home and 1 DuoNeb on route.  Patient is 98% on room air.  Physical Exam   Triage Vital Signs: ED Triage Vitals [11/10/21 0913]  Enc Vitals Group     BP      Pulse      Resp      Temp      Temp src      SpO2 98 %     Weight      Height      Head Circumference      Peak Flow      Pain Score      Pain Loc      Pain Edu?      Excl. in [address]?     Most recent vital signs: Blood pressure (!) 142/86, pulse 91, temperature 97.6 F (36.4 C), resp. rate 14, height '4\' 11"'$  (1.499 m), weight 136.1 kg, SpO2 99 %.   General: Awake, no distress.  CV:  Good peripheral perfusion.  Resp:  Normal effort.  Abd:  No distention.  Other:  Weakness of the right side of his baseline.  He is got no obvious swelling to his legs abdomen is soft nontender.  No obvious wheezing at this time.  Slightly  tachycardic   ED Results / Procedures / Treatments   Labs (all labs ordered are listed, but only abnormal results are displayed) Labs Reviewed  CBC WITH DIFFERENTIAL/PLATELET - Abnormal; Notable for the following components:      Result Value   MCH 25.8 (*)    RDW 16.3 (*)    All other components within normal limits  COMPREHENSIVE METABOLIC PANEL - Abnormal; Notable for the following components:   Glucose, Bld 123 (*)    All other components within normal limits  URINALYSIS, ROUTINE W REFLEX MICROSCOPIC - Abnormal; Notable for the following components:   Color, Urine YELLOW (*)    APPearance CLEAR (*)    All other components within normal limits  D-DIMER, QUANTITATIVE (NOT AT [facility]) - Abnormal; Notable for the following components:   D-Dimer, Quant 0.73 (*)    All other components within normal limits  SARS CORONAVIRUS 2 BY RT PCR  BRAIN NATRIURETIC PEPTIDE  PROCALCITONIN  TROPONIN I (HIGH SENSITIVITY)  TROPONIN I (HIGH SENSITIVITY)  EKG  My interpretation of EKG:  Sinus tachycardia rate of 114 without any ST elevation or T wave inversions, normal intervals  RADIOLOGY I have reviewed the xray personally and interpreted no PNA    PROCEDURES:  Critical Care performed: No  .1-3 Lead EKG Interpretation  Performed by: Vanessa [place], MD Authorized by: Vanessa [place], MD     Interpretation: abnormal     ECG rate:  110   ECG rate assessment: tachycardic     Rhythm: sinus tachycardia     Ectopy: none     Conduction: normal      MEDICATIONS ORDERED IN ED: Medications  methylPREDNISolone sodium succinate (SOLU-MEDROL) 125 mg/2 mL injection 125 mg (has no administration in time range)  iohexol (OMNIPAQUE) 350 MG/ML injection 100 mL (100 mLs Intravenous Contrast Given 11/10/21 1209)     IMPRESSION / MDM / ASSESSMENT AND PLAN / ED COURSE  I reviewed the triage vital signs and the nursing notes.   Patient's presentation is most consistent with acute presentation  with potential threat to life or bodily function.   Differential includes asthma, COVID, flu, pneumonia, pneumothorax, PE.  Patient was tachycardic but is got 3 doses of albuterol so could be from not but given he is bedbound we will get D-dimer.  No obvious swelling to suggest CHF.  Thyroid was just recently checked and was normal.   Cardiac markers were negative x2.  D-dimer slightly elevated so patient will get CT scan.  COVID test was negative.  Labs show no anemia on CBC.  CMP reassuring.  BNP is normal.  Procalcitonin negative.   CT imaging without evidence of PE but does have some bronchial wall thickening consistent with small airways disease.    They report having lots of bad allergies and we discussed using some Zyrtec, Benadryl at nighttime we will give a course of prednisone.    Considered admission for patient given he came in with concern for asthma exacerbation although no formal diagnosis from pulmonary versus a viral bronchitis.  Patient has not required any breathing treatments, no current wheezing his oxygen level is 99% and he has normal work of breathing.  After discussion with family they feel comfortable with discharge home and can return if symptoms are worsening and they understand to use his albuterol nebulizer every 6 hours.  Incidental findings on CT were discussed with him and they will follow-up with her PCP and provided a copy of report    The patient is on the cardiac monitor to evaluate for evidence of arrhythmia and/or significant heart rate changes.      FINAL CLINICAL IMPRESSION(S) / ED DIAGNOSES   Final diagnoses:  Bronchitis     Rx / DC Orders   ED Discharge Orders          Ordered    predniSONE (DELTASONE) 20 MG tablet  Daily with breakfast        11/10/21 1304             Note:  This document was prepared using Dragon voice recognition software and may include unintentional dictation errors.   Vanessa [place], MD 11/10/21 1304     Vanessa [place], MD 11/10/21 1310

## 2021-11-10 NOTE — ED Notes (Signed)
Lung sounds clear to auscultation bilaterally, respirations even and unlabored.

## 2021-11-10 NOTE — ED Notes (Signed)
Pt placed in paper scrub pants, brief, and socks.

## 2021-11-10 NOTE — ED Triage Notes (Signed)
Coming from home with [facility], starting last night and cough x2day With [facility]. Slight wheezing. 2 albuterol nebs at home, one duoneb enroute.  98RA enroute.   Pmh asthma, DM2 ([id number]), wheelchair bound due to stroke,

## 2021-11-10 NOTE — ED Notes (Signed)
Pt brief changed and purewick placed.

## 2021-11-10 NOTE — Discharge Instructions (Addendum)
Take Steroids to help with airway inflammation.  I suspect this is an asthma exacerbation and he can take his albuterol every 6 hours at home to help with any shortness of breath and return to the ER if he develops worsening shortness of breath or any other concerns  IMPRESSION: 1. No acute pulmonary embolism. 2. Mild bronchial wall thickening which can be seen in the setting of small airways disease. 3. Multiple thyroid nodules measuring up to approximately 3 cm. Recommend nonemergent thyroid US if not previously performed. Reference: J Am Coll Radiol. 2015 Feb;12(2): 143-50 4. There is a technically indeterminate 12 mm RIGHT adrenal nodule. Given relative stability since 2019, this likely reflects a benign adenoma. Definitive characterization could be performed with adrenal protocol CT or MRI.

## 2021-11-12 ENCOUNTER — Ambulatory Visit: Payer: Medicare Other

## 2021-11-14 ENCOUNTER — Ambulatory Visit: Payer: Medicare Other | Attending: Family Medicine

## 2021-11-14 DIAGNOSIS — R262 Difficulty in walking, not elsewhere classified: Secondary | ICD-10-CM | POA: Diagnosis present

## 2021-11-14 DIAGNOSIS — R269 Unspecified abnormalities of gait and mobility: Secondary | ICD-10-CM | POA: Diagnosis present

## 2021-11-14 DIAGNOSIS — M6281 Muscle weakness (generalized): Secondary | ICD-10-CM | POA: Diagnosis present

## 2021-11-14 DIAGNOSIS — R2681 Unsteadiness on feet: Secondary | ICD-10-CM | POA: Diagnosis present

## 2021-11-14 DIAGNOSIS — R296 Repeated falls: Secondary | ICD-10-CM | POA: Insufficient documentation

## 2021-11-14 DIAGNOSIS — R2689 Other abnormalities of gait and mobility: Secondary | ICD-10-CM | POA: Diagnosis present

## 2021-11-14 DIAGNOSIS — R278 Other lack of coordination: Secondary | ICD-10-CM | POA: Insufficient documentation

## 2021-11-14 NOTE — Therapy (Signed)
OUTPATIENT PHYSICAL THERAPY NEURO TREATMENT NOTE   Patient Name: Curtis Powell MRN: [id number] DOB:June 28, 1965, 56 y.o., male Today's Date: 11/14/2021   PCP: Valera Castle, MD REFERRING PROVIDER: Valera Castle, MD   PT End of Session - 11/14/21 1323     Visit Number 75    Number of Visits 15    Date for PT Re-Evaluation 12/31/21    Authorization Type UHC Medicare; [place] Medicaid    Authorization Time Period 10/08/21-12/31/21    PT Start Time 36    PT Stop Time 1340    PT Time Calculation (min) 30 min    Equipment Utilized During Treatment Gait belt    Activity Tolerance Patient tolerated treatment well    Behavior During Therapy WFL for tasks assessed/performed                 Past Medical History:  Diagnosis Date   Asthma    GERD (gastroesophageal reflux disease)    Hyperlipidemia    Hypertension    Obesity    Stroke [facility])    Past Surgical History:  Procedure Laterality Date   BRAIN SURGERY     Patient Active Problem List   Diagnosis Date Noted   Impaired glucose tolerance 03/23/2020   Learning disability 03/23/2020   Urinary and fecal incontinence 06/14/2019   Chronic pain syndrome 03/23/2019   Constipation 03/23/2019   Dysarthria 03/23/2019   Dysphagia 03/23/2019   Learning difficulty 03/23/2019   Morbid obesity ([place]) 03/23/2019   Muscle weakness 03/23/2019   Vitamin D deficiency 03/23/2019   Bilateral carotid artery stenosis 12/15/2018   Moderate aortic valve stenosis 12/15/2018   BMI 45.0-49.9, adult ([place]) 11/10/2018   Cerebrovascular accident ([place]) 05/02/2016   Hemiparesis affecting right side as late effect of cerebrovascular accident ([place]) 05/01/2016   Brain tumor ([place]) 11/24/2014   Gastro-esophageal reflux disease without esophagitis 11/24/2014   Accident due to mechanical fall without injury 08/03/2014   Essential hypertension 06/13/2014   Mixed hyperlipidemia 06/13/2014   Unspecified sequelae of cerebral infarction  06/13/2014   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/2014   Type 2 diabetes mellitus without complication ([place]) 01/30/8526   Neck mass 09/08/2013   Swimmer's ear 09/08/2013   Erectile dysfunction 01/03/2013   Prediabetes 01/03/2013    ONSET DATE: 05/09/2016  REFERRING DIAG: CVA (cerebral vascular accident) ([place])  THERAPY DIAG:  Abnormality of gait and mobility  Muscle weakness (generalized)  Unsteadiness on feet  Other lack of coordination  Difficulty in walking, not elsewhere classified  Repeated falls  Other abnormalities of gait and mobility  Rationale for Evaluation and Treatment Rehabilitation  SUBJECTIVE:  SUBJECTIVE STATEMENT: Pt was sick last week, missed a session here, went to ED. Pt had bronchitis, which is improving, but he still has wheezing.   Pt accompanied by: family member  PERTINENT HISTORY: History of CVA Feb 2018. He was previously fully independent, but this stroke caused significant deficits, mainly on the right side. He reports difficulty walking and transfers, uses a WC for most  His mom had to move in with him and now is his main caregiver. Patient had outpatient PT from November 2018-March 2022. Patient reports since being discharged from outpatient therapy he has kept up with his HEP daily. He does report desire for walking better and for getting in and out of bed easier. He reports his mom is helping with some ADLs. He reports needing help with rolling in bed. He reports his mom/aide assist with dressing. He has a home aide 3 days out of the week." PMH significant for hx of brain tumor, past CVA, and HTN. See chart for additional information.  PAIN:  Are you having pain? No  PRECAUTIONS: None  WEIGHT BEARING RESTRICTIONS No  PLOF: Independent  PATIENT GOALS: "to walk and  sit-to-stand"   OBJECTIVE:   DIAGNOSTIC FINDINGS: None recent    TODAY'S TREATMENT: -8x STS from WC, YRW, recovery interval between -overground AMB c YRW, 41f, 3 minutes 11 seconds  -static standing against wall x3 minute    PATIENT EDUCATION: Education details: Pt educated throughout session about proper posture and technique with exercises. Improved exercise technique, movement at target joints, use of target muscles after min to mod verbal, visual, tactile cues. Person educated: Patient Education method: Explanation, Demonstration, Tactile cues, and Verbal cues Education comprehension: verbalized understanding, returned demonstration, and verbal cues required   HOME EXERCISE PROGRAM:  No updates as of 10/31/21  Instructed to continue with daily walking.    GOALS: Goals reviewed with patient? No  SHORT TERM GOALS: Target date: 11/19/2021  Pt will be independent with HEP in order to improve strength and balance in order to decrease fall risk and improve function at home. Baseline: NEW Goal status: INITIAL   LONG TERM GOALS: Target date: 12/31/2021  Pt will increase FOTO to at least 57 in order to demonstrate improvement in function related to mobility and QoL. Baseline: 7/3: 50 Goal status: INITIAL  2.  Pt (< 694yrs old) will complete 5xSTS test < 20 seconds indicating an increase in LE strength and improved balance. Baseline: 7/3: 175m:01seconds,  Goal status: INITIAL  3.  Pt will increase 10MWT to > 0.5 m/s (30 sec) as to improve gait speed for better home ambulation and reduce fall risk. Baseline: 7/3: 0.11 m/s with bRW Goal status: INITIAL  4.  Pt will increase BLE gross strength to 4+/5 as to improve functional strenth for independent gait, increased standing tolerance, and increased ADL ability. Baseline: 7/3: grossly 4-/5 (see strength section) Goal status: INITIAL  5.  Pt will ambulate 250' with LRAD and supervision with no rest breaks to allow for  improved mobility within home and increased independence. Baseline: 7/3: 60.3' Goal status: INITIAL  ASSESSMENT:  CLINICAL IMPRESSION:  Patient presents with excellent motivation. He requires intermittent seated rest breaks due to fatigue between interventions. Lots of practice with transfers today and return to overground AMB like he has been practicing a thome. YRW is a better height option given his short stature. Patient tolerated well, but does fatigue quickly;  The pt will benefit from further skilled PT to increase strength and  endurance to improve QoL and independence with ADLs.   OBJECTIVE IMPAIRMENTS Abnormal gait, decreased activity tolerance, decreased balance, decreased coordination, decreased endurance, decreased mobility, difficulty walking, decreased ROM, decreased strength, postural dysfunction, and obesity.   ACTIVITY LIMITATIONS carrying, lifting, bending, sitting, standing, squatting, stairs, transfers, bed mobility, dressing, and locomotion level  PARTICIPATION LIMITATIONS: cleaning, laundry, interpersonal relationship, driving, and community activity  PERSONAL FACTORS Past/current experiences, Time since onset of injury/illness/exacerbation, and 3+ comorbidities: hx of brain tumor, past CVA, HTN, obesity, arthritis  are also affecting patient's functional outcome.   REHAB POTENTIAL: Good  CLINICAL DECISION MAKING: Stable/uncomplicated  EVALUATION COMPLEXITY: Low  PLAN: PT FREQUENCY: 2x/week  PT DURATION: 12 weeks  PLANNED INTERVENTIONS: Therapeutic exercises, Therapeutic activity, Neuromuscular re-education, Balance training, Gait training, Patient/Family education, Joint mobilization, Stair training, Vestibular training, Orthotic/Fit training, DME instructions, Dry Needling, Electrical stimulation, Wheelchair mobility training, Cryotherapy, Moist heat, Compression bandaging, Manual therapy, and Re-evaluation  PLAN FOR NEXT SESSION: strengthening, gait, endurance  training    Zamia Tyminski C, PT, DPT 11/14/2021, 1:27 PM

## 2021-11-19 ENCOUNTER — Ambulatory Visit: Payer: Medicare Other

## 2021-11-19 DIAGNOSIS — R2681 Unsteadiness on feet: Secondary | ICD-10-CM

## 2021-11-19 DIAGNOSIS — R269 Unspecified abnormalities of gait and mobility: Secondary | ICD-10-CM | POA: Diagnosis not present

## 2021-11-19 DIAGNOSIS — M6281 Muscle weakness (generalized): Secondary | ICD-10-CM

## 2021-11-19 DIAGNOSIS — R262 Difficulty in walking, not elsewhere classified: Secondary | ICD-10-CM

## 2021-11-19 NOTE — Therapy (Signed)
OUTPATIENT PHYSICAL THERAPY NEURO TREATMENT NOTE   Patient Name: Curtis Powell MRN: [id number] DOB:12/26/1965, 56 y.o., male Today's Date: 11/19/2021   PCP: Valera Castle, MD REFERRING PROVIDER: Valera Castle, MD   PT End of Session - 11/19/21 1354     Visit Number 65    Number of Visits 92    Date for PT Re-Evaluation 12/31/21    Authorization Type UHC Medicare; [place] Medicaid    Authorization Time Period 10/08/21-12/31/21    PT Start Time 1354    PT Stop Time 1435    PT Time Calculation (min) 41 min    Equipment Utilized During Treatment Gait belt    Activity Tolerance Patient tolerated treatment well    Behavior During Therapy WFL for tasks assessed/performed                 Past Medical History:  Diagnosis Date   Asthma    GERD (gastroesophageal reflux disease)    Hyperlipidemia    Hypertension    Obesity    Stroke [facility])    Past Surgical History:  Procedure Laterality Date   BRAIN SURGERY     Patient Active Problem List   Diagnosis Date Noted   Impaired glucose tolerance 03/23/2020   Learning disability 03/23/2020   Urinary and fecal incontinence 06/14/2019   Chronic pain syndrome 03/23/2019   Constipation 03/23/2019   Dysarthria 03/23/2019   Dysphagia 03/23/2019   Learning difficulty 03/23/2019   Morbid obesity ([place]) 03/23/2019   Muscle weakness 03/23/2019   Vitamin D deficiency 03/23/2019   Bilateral carotid artery stenosis 12/15/2018   Moderate aortic valve stenosis 12/15/2018   BMI 45.0-49.9, adult ([place]) 11/10/2018   Cerebrovascular accident ([place]) 05/02/2016   Hemiparesis affecting right side as late effect of cerebrovascular accident ([place]) 05/01/2016   Brain tumor ([place]) 11/24/2014   Gastro-esophageal reflux disease without esophagitis 11/24/2014   Accident due to mechanical fall without injury 08/03/2014   Essential hypertension 06/13/2014   Mixed hyperlipidemia 06/13/2014   Unspecified sequelae of cerebral infarction  06/13/2014   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/2014   Type 2 diabetes mellitus without complication ([place]) 80/99/8338   Neck mass 09/08/2013   Swimmer's ear 09/08/2013   Erectile dysfunction 01/03/2013   Prediabetes 01/03/2013    ONSET DATE: 05/09/2016  REFERRING DIAG: CVA (cerebral vascular accident) ([place])  THERAPY DIAG:  Difficulty in walking, not elsewhere classified  Muscle weakness (generalized)  Abnormality of gait and mobility  Unsteadiness on feet  Rationale for Evaluation and Treatment Rehabilitation  SUBJECTIVE:  SUBJECTIVE STATEMENT: Pt was sick last week, missed a session here, went to ED. Pt had bronchitis, which is improving, but he still has wheezing.   Pt accompanied by: family member  PERTINENT HISTORY: History of CVA Feb 2018. He was previously fully independent, but this stroke caused significant deficits, mainly on the right side. He reports difficulty walking and transfers, uses a WC for most  His mom had to move in with him and now is his main caregiver. Patient had outpatient PT from November 2018-March 2022. Patient reports since being discharged from outpatient therapy he has kept up with his HEP daily. He does report desire for walking better and for getting in and out of bed easier. He reports his mom is helping with some ADLs. He reports needing help with rolling in bed. He reports his mom/aide assist with dressing. He has a home aide 3 days out of the week." PMH significant for hx of brain tumor, past CVA, and HTN. See chart for additional information.  PAIN:  Are you having pain? No  PRECAUTIONS: None  WEIGHT BEARING RESTRICTIONS No  PLOF: Independent  PATIENT GOALS: "to walk and sit-to-stand"   OBJECTIVE:   DIAGNOSTIC FINDINGS: None recent    TODAY'S TREATMENT: -10x  STS from WC, BRW, recovery interval between -overground AMB c BRW, 160f around gym circuit -seated LAQ, 5# AW, 2x15 each LE -seated marches, 5#, AW, 2x15 each LE     PATIENT EDUCATION: Education details: Pt educated throughout session about proper posture and technique with exercises. Improved exercise technique, movement at target joints, use of target muscles after min to mod verbal, visual, tactile cues. Person educated: Patient Education method: Explanation, Demonstration, Tactile cues, and Verbal cues Education comprehension: verbalized understanding, returned demonstration, and verbal cues required   [place]  11/19/21:  Pt instructed on locations that may carry ankle weights for exercises to be performed at home.  Instructed to continue with daily walking.    GOALS: Goals reviewed with patient? No  SHORT TERM GOALS: Target date: 11/19/2021  Pt will be independent with HEP in order to improve strength and balance in order to decrease fall risk and improve function at home. Baseline: NEW Goal status: INITIAL   LONG TERM GOALS: Target date: 12/31/2021  Pt will increase FOTO to at least 57 in order to demonstrate improvement in function related to mobility and QoL. Baseline: 7/3: 50 Goal status: INITIAL  2.  Pt (< 691yrs old) will complete 5xSTS test < 20 seconds indicating an increase in LE strength and improved balance. Baseline: 7/3: 177m:01seconds,  Goal status: INITIAL  3.  Pt will increase 10MWT to > 0.5 m/s (30 sec) as to improve gait speed for better home ambulation and reduce fall risk. Baseline: 7/3: 0.11 m/s with bRW Goal status: INITIAL  4.  Pt will increase BLE gross strength to 4+/5 as to improve functional strenth for independent gait, increased standing tolerance, and increased ADL ability. Baseline: 7/3: grossly 4-/5 (see strength section) Goal status: INITIAL  5.  Pt will ambulate 250' with LRAD and supervision with no rest breaks to  allow for improved mobility within home and increased independence. Baseline: 7/3: 60.3' Goal status: INITIAL  ASSESSMENT:  CLINICAL IMPRESSION:   Pt put forth great effort in order to ambulate around the gym today.  Pt did not experience any buckling of the knees and was able to progress significantly compared to previous sessions.  Pt given verbal cues for activity tolerance in order  to be cognizant of fatigue before continuing.  Pt also instructed on locations that may carry ankle weights for him to continue with exercises at home.  Pt will continue to benefit from skilled therapy in order to address deficits listed below.      OBJECTIVE IMPAIRMENTS Abnormal gait, decreased activity tolerance, decreased balance, decreased coordination, decreased endurance, decreased mobility, difficulty walking, decreased ROM, decreased strength, postural dysfunction, and obesity.   ACTIVITY LIMITATIONS carrying, lifting, bending, sitting, standing, squatting, stairs, transfers, bed mobility, dressing, and locomotion level  PARTICIPATION LIMITATIONS: cleaning, laundry, interpersonal relationship, driving, and community activity  PERSONAL FACTORS Past/current experiences, Time since onset of injury/illness/exacerbation, and 3+ comorbidities: hx of brain tumor, past CVA, HTN, obesity, arthritis  are also affecting patient's functional outcome.   REHAB POTENTIAL: Good  CLINICAL DECISION MAKING: Stable/uncomplicated  EVALUATION COMPLEXITY: Low  PLAN: PT FREQUENCY: 2x/week  PT DURATION: 12 weeks  PLANNED INTERVENTIONS: Therapeutic exercises, Therapeutic activity, Neuromuscular re-education, Balance training, Gait training, Patient/Family education, Joint mobilization, Stair training, Vestibular training, Orthotic/Fit training, DME instructions, Dry Needling, Electrical stimulation, Wheelchair mobility training, Cryotherapy, Moist heat, Compression bandaging, Manual therapy, and Re-evaluation  PLAN  FOR NEXT SESSION: strengthening, gait, endurance training    Christie Nottingham, PT, DPT 11/19/2021, 4:43 PM

## 2021-11-21 ENCOUNTER — Ambulatory Visit: Payer: Medicare Other

## 2021-11-21 DIAGNOSIS — R269 Unspecified abnormalities of gait and mobility: Secondary | ICD-10-CM | POA: Diagnosis not present

## 2021-11-21 DIAGNOSIS — R2681 Unsteadiness on feet: Secondary | ICD-10-CM

## 2021-11-21 DIAGNOSIS — R262 Difficulty in walking, not elsewhere classified: Secondary | ICD-10-CM

## 2021-11-21 DIAGNOSIS — M6281 Muscle weakness (generalized): Secondary | ICD-10-CM

## 2021-11-21 NOTE — Therapy (Signed)
OUTPATIENT PHYSICAL THERAPY NEURO TREATMENT NOTE   Patient Name: Curtis Powell MRN: [id number] DOB:1965/09/02, 56 y.o., male Today's Date: 11/21/2021   PCP: Valera Castle, MD REFERRING PROVIDER: Valera Castle, MD   PT End of Session - 11/21/21 1351     Visit Number 50    Number of Visits 58    Date for PT Re-Evaluation 12/31/21    Authorization Type UHC Medicare; [place] Medicaid    Authorization Time Period 10/08/21-12/31/21    PT Start Time 1347    PT Stop Time 1423    PT Time Calculation (min) 36 min    Equipment Utilized During Treatment Gait belt    Activity Tolerance Patient tolerated treatment well;No increased pain    Behavior During Therapy WFL for tasks assessed/performed                  Past Medical History:  Diagnosis Date   Asthma    GERD (gastroesophageal reflux disease)    Hyperlipidemia    Hypertension    Obesity    Stroke [facility])    Past Surgical History:  Procedure Laterality Date   BRAIN SURGERY     Patient Active Problem List   Diagnosis Date Noted   Impaired glucose tolerance 03/23/2020   Learning disability 03/23/2020   Urinary and fecal incontinence 06/14/2019   Chronic pain syndrome 03/23/2019   Constipation 03/23/2019   Dysarthria 03/23/2019   Dysphagia 03/23/2019   Learning difficulty 03/23/2019   Morbid obesity ([place]) 03/23/2019   Muscle weakness 03/23/2019   Vitamin D deficiency 03/23/2019   Bilateral carotid artery stenosis 12/15/2018   Moderate aortic valve stenosis 12/15/2018   BMI 45.0-49.9, adult ([place]) 11/10/2018   Cerebrovascular accident ([place]) 05/02/2016   Hemiparesis affecting right side as late effect of cerebrovascular accident ([place]) 05/01/2016   Brain tumor ([place]) 11/24/2014   Gastro-esophageal reflux disease without esophagitis 11/24/2014   Accident due to mechanical fall without injury 08/03/2014   Essential hypertension 06/13/2014   Mixed hyperlipidemia 06/13/2014   Unspecified sequelae of cerebral  infarction 06/13/2014   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/2014   Type 2 diabetes mellitus without complication ([place]) 38/25/0539   Neck mass 09/08/2013   Swimmer's ear 09/08/2013   Erectile dysfunction 01/03/2013   Prediabetes 01/03/2013    ONSET DATE: 05/09/2016  REFERRING DIAG: CVA (cerebral vascular accident) ([place])  THERAPY DIAG:  Muscle weakness (generalized)  Unsteadiness on feet  Difficulty in walking, not elsewhere classified  Rationale for Evaluation and Treatment Rehabilitation  SUBJECTIVE:  SUBJECTIVE STATEMENT: Pt feels good today. No stumbles/falls. No aches or pains. Pt reports he has no other current concerns.   Pt accompanied by: family member  PERTINENT HISTORY: History of CVA Feb 2018. He was previously fully independent, but this stroke caused significant deficits, mainly on the right side. He reports difficulty walking and transfers, uses a WC for most  His mom had to move in with him and now is his main caregiver. Patient had outpatient PT from November 2018-March 2022. Patient reports since being discharged from outpatient therapy he has kept up with his HEP daily. He does report desire for walking better and for getting in and out of bed easier. He reports his mom is helping with some ADLs. He reports needing help with rolling in bed. He reports his mom/aide assist with dressing. He has a home aide 3 days out of the week." PMH significant for hx of brain tumor, past CVA, and HTN. See chart for additional information.  PAIN:  Are you having pain? No  PRECAUTIONS: None  WEIGHT BEARING RESTRICTIONS No  PLOF: Independent  PATIENT GOALS: "to walk and sit-to-stand"   OBJECTIVE:   DIAGNOSTIC FINDINGS: None recent    TODAY'S TREATMENT: -LAQ 15x 3 sec holds each LE. Cuing for  technique/full knee ext. Pt rate medium -10x2 sets STS from WC, BRW, recovery interval between. Pt rates medium -seated LAQ, 5# AW, 2x15 each LE. Continued cuing for full knee ext.  -Seated adductor squeeze with p.ball 10x with 3 sec holds -seated marches, 5#, AW, 1x10 each LE -overground AMB c BRW 2x10 meters with WC follow. PT provides up to min a to correct pt LOB  Ended early per pt request due to needing to use restroom   PATIENT EDUCATION: Education details: Pt educated throughout session about proper posture and technique with exercises. Improved exercise technique, movement at target joints, use of target muscles after min to mod verbal, visual, tactile cues. Person educated: Patient Education method: Explanation, Demonstration, Tactile cues, and Verbal cues Education comprehension: verbalized understanding, returned demonstration, and verbal cues required   [place] updates on this date, pt to continue as previously given  11/19/21:  Pt instructed on locations that may carry ankle weights for exercises to be performed at home.  Instructed to continue with daily walking.    GOALS: Goals reviewed with patient? No  SHORT TERM GOALS: Target date: 11/19/2021  Pt will be independent with HEP in order to improve strength and balance in order to decrease fall risk and improve function at home. Baseline: NEW Goal status: INITIAL   LONG TERM GOALS: Target date: 12/31/2021  Pt will increase FOTO to at least 57 in order to demonstrate improvement in function related to mobility and QoL. Baseline: 7/3: 50 Goal status: INITIAL  2.  Pt (< 56 yrs old) will complete 5xSTS test < 20 seconds indicating an increase in LE strength and improved balance. Baseline: 7/3: 65mn:01seconds,  Goal status: INITIAL  3.  Pt will increase 10MWT to > 0.5 m/s (30 sec) as to improve gait speed for better home ambulation and reduce fall risk. Baseline: 7/3: 0.11 m/s with bRW Goal  status: INITIAL  4.  Pt will increase BLE gross strength to 4+/5 as to improve functional strenth for independent gait, increased standing tolerance, and increased ADL ability. Baseline: 7/3: grossly 4-/5 (see strength section) Goal status: INITIAL  5.  Pt will ambulate 250' with LRAD and supervision with no rest breaks to allow for improved  mobility within home and increased independence. Baseline: 7/3: 60.3' Goal status: INITIAL  ASSESSMENT:  CLINICAL IMPRESSION:   Pt able to progress number of reps performed with majority of interventions today, indicating improved endurance. This indicates carryover between sessions. While pt shows progress, he still struggles with balance with gait, often losing balance posteriorly, requiring PT to provide min a to correct. Pt will continue to benefit from skilled therapy in order to address deficits of gait, strength, balance and mobility.       OBJECTIVE IMPAIRMENTS Abnormal gait, decreased activity tolerance, decreased balance, decreased coordination, decreased endurance, decreased mobility, difficulty walking, decreased ROM, decreased strength, postural dysfunction, and obesity.   ACTIVITY LIMITATIONS carrying, lifting, bending, sitting, standing, squatting, stairs, transfers, bed mobility, dressing, and locomotion level  PARTICIPATION LIMITATIONS: cleaning, laundry, interpersonal relationship, driving, and community activity  PERSONAL FACTORS Past/current experiences, Time since onset of injury/illness/exacerbation, and 3+ comorbidities: hx of brain tumor, past CVA, HTN, obesity, arthritis  are also affecting patient's functional outcome.   REHAB POTENTIAL: Good  CLINICAL DECISION MAKING: Stable/uncomplicated  EVALUATION COMPLEXITY: Low  PLAN: PT FREQUENCY: 2x/week  PT DURATION: 12 weeks  PLANNED INTERVENTIONS: Therapeutic exercises, Therapeutic activity, Neuromuscular re-education, Balance training, Gait training, Patient/Family  education, Joint mobilization, Stair training, Vestibular training, Orthotic/Fit training, DME instructions, Dry Needling, Electrical stimulation, Wheelchair mobility training, Cryotherapy, Moist heat, Compression bandaging, Manual therapy, and Re-evaluation  PLAN FOR NEXT SESSION: strengthening, gait, endurance training, continue plan    Zollie Pee, PT, DPT 11/21/2021, 5:23 PM

## 2021-11-26 ENCOUNTER — Encounter: Payer: Self-pay | Admitting: Internal Medicine

## 2021-12-03 ENCOUNTER — Ambulatory Visit: Payer: Medicare Other

## 2021-12-03 DIAGNOSIS — R269 Unspecified abnormalities of gait and mobility: Secondary | ICD-10-CM | POA: Diagnosis not present

## 2021-12-03 DIAGNOSIS — M6281 Muscle weakness (generalized): Secondary | ICD-10-CM

## 2021-12-03 DIAGNOSIS — R2681 Unsteadiness on feet: Secondary | ICD-10-CM

## 2021-12-03 DIAGNOSIS — R262 Difficulty in walking, not elsewhere classified: Secondary | ICD-10-CM

## 2021-12-03 NOTE — Therapy (Signed)
OUTPATIENT PHYSICAL THERAPY NEURO TREATMENT NOTE/Physical Therapy Progress Note   Dates of reporting period  10/15/2021   to   12/03/2021    Patient Name: Curtis Powell MRN: [id number] DOB:1965-07-13, 56 y.o., male Today's Date: 12/03/2021   PCP: Valera Castle, MD REFERRING PROVIDER: Valera Castle, MD   PT End of Session - 12/03/21 1407     Visit Number [id number]    Number of Visits 57    Date for PT Re-Evaluation 12/31/21    Authorization Type UHC Medicare; [place] Medicaid    Authorization Time Period 10/08/21-12/31/21    PT Start Time 1351    PT Stop Time 1430    PT Time Calculation (min) 39 min    Equipment Utilized During Treatment Gait belt    Activity Tolerance Patient tolerated treatment well;No increased pain    Behavior During Therapy WFL for tasks assessed/performed                   Past Medical History:  Diagnosis Date   Asthma    GERD (gastroesophageal reflux disease)    Hyperlipidemia    Hypertension    Obesity    Stroke [facility])    Past Surgical History:  Procedure Laterality Date   BRAIN SURGERY     Patient Active Problem List   Diagnosis Date Noted   Impaired glucose tolerance 03/23/2020   Learning disability 03/23/2020   Urinary and fecal incontinence 06/14/2019   Chronic pain syndrome 03/23/2019   Constipation 03/23/2019   Dysarthria 03/23/2019   Dysphagia 03/23/2019   Learning difficulty 03/23/2019   Morbid obesity ([place]) 03/23/2019   Muscle weakness 03/23/2019   Vitamin D deficiency 03/23/2019   Bilateral carotid artery stenosis 12/15/2018   Moderate aortic valve stenosis 12/15/2018   BMI 45.0-49.9, adult ([place]) 11/10/2018   Cerebrovascular accident ([place]) 05/02/2016   Hemiparesis affecting right side as late effect of cerebrovascular accident ([place]) 05/01/2016   Brain tumor ([place]) 11/24/2014   Gastro-esophageal reflux disease without esophagitis 11/24/2014   Accident due to mechanical fall without injury 08/03/2014   Essential  hypertension 06/13/2014   Mixed hyperlipidemia 06/13/2014   Unspecified sequelae of cerebral infarction 06/13/2014   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/2014   Type 2 diabetes mellitus without complication ([place]) 78/58/8502   Neck mass 09/08/2013   Swimmer's ear 09/08/2013   Erectile dysfunction 01/03/2013   Prediabetes 01/03/2013    ONSET DATE: 05/09/2016  REFERRING DIAG: CVA (cerebral vascular accident) ([place])  THERAPY DIAG:  Unsteadiness on feet  Difficulty in walking, not elsewhere classified  Muscle weakness (generalized)  Rationale for Evaluation and Treatment Rehabilitation  SUBJECTIVE:  SUBJECTIVE STATEMENT: Pt reports no pain currently. No stumbles/falls. He says HEP is going "pretty good."  Pt accompanied by: family member  PERTINENT HISTORY: History of CVA Feb 2018. He was previously fully independent, but this stroke caused significant deficits, mainly on the right side. He reports difficulty walking and transfers, uses a WC for most  His mom had to move in with him and now is his main caregiver. Patient had outpatient PT from November 2018-March 2022. Patient reports since being discharged from outpatient therapy he has kept up with his HEP daily. He does report desire for walking better and for getting in and out of bed easier. He reports his mom is helping with some ADLs. He reports needing help with rolling in bed. He reports his mom/aide assist with dressing. He has a home aide 3 days out of the week." PMH significant for hx of brain tumor, past CVA, and HTN. See chart for additional information.  PAIN:  Are you having pain? No  PRECAUTIONS: None  WEIGHT BEARING RESTRICTIONS No  PLOF: Independent  PATIENT GOALS: "to walk and sit-to-stand"   OBJECTIVE:   DIAGNOSTIC FINDINGS: None  recent    TODAY'S TREATMENT:  Goals reassessed for progress note. PT instructed pt in technique with goal testing throughout and indications of performance. See goal section for details.  Other interventions performed- -seated LAQ, 5# AW, 2x15 each LE. Continued cuing for knee ext. RLE. -Seated adductor squeeze with p.ball 12x with 3 sec holds. Very challenging for pt -seated marches, 5#, AW, 1x15 each LE -10x2 sets STS from WC, BRW, recovery interval between. Pt continues to rate medium     PATIENT EDUCATION: Education details: Pt educated throughout session about proper posture and technique with exercises. Improved exercise technique, movement at target joints, use of target muscles after min to mod verbal, visual, tactile cues. Goals, plan, progress. Person educated: Patient Education method: Explanation, Demonstration, Tactile cues, and Verbal cues Education comprehension: verbalized understanding, returned demonstration, and verbal cues required   [place] updates on this date, pt to continue as previously given  11/19/21:  Pt instructed on locations that may carry ankle weights for exercises to be performed at home.  Instructed to continue with daily walking.    GOALS: Goals reviewed with patient? No  SHORT TERM GOALS: Target date: 11/19/2021  Pt will be independent with HEP in order to improve strength and balance in order to decrease fall risk and improve function at home. Baseline: 8/28: pt reports doing HEP every day Goal status: INITIAL   LONG TERM GOALS: Target date: 12/31/2021  Pt will increase FOTO to at least 57 in order to demonstrate improvement in function related to mobility and QoL. Baseline: 7/3: 50; 8/28: deferred to next session Goal status: INITIAL  2.  Pt (< 92 yrs old) will complete 5xSTS test < 20 seconds indicating an increase in LE strength and improved balance. Baseline: 7/3: 77mn:01seconds, 8/28: 40.5 seconds Goal status:  INITIAL  3.  Pt will increase 10MWT to > 0.5 m/s (30 sec) as to improve gait speed for better home ambulation and reduce fall risk. Baseline: 7/3: 0.11 m/s with bRW Goal status: INITIAL  4.  Pt will increase BLE gross strength to 4+/5 as to improve functional strenth for independent gait, increased standing tolerance, and increased ADL ability. Baseline: 7/3: grossly 4-/5 (see strength section); 8/28: 4+/5  Goal status: MET  5.  Pt will ambulate 250' with LRAD and supervision with no rest breaks to  allow for improved mobility within home and increased independence. Baseline: 7/3: 60.3'; 8/28: 97 ft Goal status: INITIAL  ASSESSMENT:  CLINICAL IMPRESSION:  Goals retested for progress note. Pt making gains AEB achieving MMT goal, and progressing on 5xSTS and gait goal. This indicates increased strength, gait ability, and LE power. While pt shows progress, scores still indicate the pt is at a risk for future falls and has yet to meet gait goals. FOTO and 10MWT deferred to next session. Pt will continue to benefit from skilled therapy in order to address deficits of gait, strength, balance and mobility.       OBJECTIVE IMPAIRMENTS Abnormal gait, decreased activity tolerance, decreased balance, decreased coordination, decreased endurance, decreased mobility, difficulty walking, decreased ROM, decreased strength, postural dysfunction, and obesity.   ACTIVITY LIMITATIONS carrying, lifting, bending, sitting, standing, squatting, stairs, transfers, bed mobility, dressing, and locomotion level  PARTICIPATION LIMITATIONS: cleaning, laundry, interpersonal relationship, driving, and community activity  PERSONAL FACTORS Past/current experiences, Time since onset of injury/illness/exacerbation, and 3+ comorbidities: hx of brain tumor, past CVA, HTN, obesity, arthritis  are also affecting patient's functional outcome.   REHAB POTENTIAL: Good  CLINICAL DECISION MAKING:  Stable/uncomplicated  EVALUATION COMPLEXITY: Low  PLAN: PT FREQUENCY: 2x/week  PT DURATION: 12 weeks  PLANNED INTERVENTIONS: Therapeutic exercises, Therapeutic activity, Neuromuscular re-education, Balance training, Gait training, Patient/Family education, Joint mobilization, Stair training, Vestibular training, Orthotic/Fit training, DME instructions, Dry Needling, Electrical stimulation, Wheelchair mobility training, Cryotherapy, Moist heat, Compression bandaging, Manual therapy, and Re-evaluation  PLAN FOR NEXT SESSION: strengthening, gait, endurance training, continue plan    Zollie Pee, PT, DPT 12/03/2021, 5:14 PM

## 2021-12-05 ENCOUNTER — Ambulatory Visit: Payer: Medicare Other

## 2021-12-05 DIAGNOSIS — R262 Difficulty in walking, not elsewhere classified: Secondary | ICD-10-CM

## 2021-12-05 DIAGNOSIS — M6281 Muscle weakness (generalized): Secondary | ICD-10-CM

## 2021-12-05 DIAGNOSIS — R269 Unspecified abnormalities of gait and mobility: Secondary | ICD-10-CM | POA: Diagnosis not present

## 2021-12-05 DIAGNOSIS — R2681 Unsteadiness on feet: Secondary | ICD-10-CM

## 2021-12-05 NOTE — Therapy (Unsigned)
OUTPATIENT PHYSICAL THERAPY NEURO TREATMENT NOTE  Patient Name: Curtis Powell MRN: [id number] DOB:Jul 22, 1965, 56 y.o., male Today's Date: 12/06/2021   PCP: Valera Castle, MD REFERRING PROVIDER: Valera Castle, MD   PT End of Session - 12/06/21 0853     Visit Number 72    Number of Visits 46    Date for PT Re-Evaluation 12/31/21    Authorization Type UHC Medicare; [place] Medicaid    Authorization Time Period 10/08/21-12/31/21    PT Start Time 1301    PT Stop Time 1344    PT Time Calculation (min) 43 min    Equipment Utilized During Treatment Gait belt    Activity Tolerance Patient tolerated treatment well;No increased pain    Behavior During Therapy WFL for tasks assessed/performed                    Past Medical History:  Diagnosis Date   Asthma    GERD (gastroesophageal reflux disease)    Hyperlipidemia    Hypertension    Obesity    Stroke [facility])    Past Surgical History:  Procedure Laterality Date   BRAIN SURGERY     Patient Active Problem List   Diagnosis Date Noted   Impaired glucose tolerance 03/23/2020   Learning disability 03/23/2020   Urinary and fecal incontinence 06/14/2019   Chronic pain syndrome 03/23/2019   Constipation 03/23/2019   Dysarthria 03/23/2019   Dysphagia 03/23/2019   Learning difficulty 03/23/2019   Morbid obesity ([place]) 03/23/2019   Muscle weakness 03/23/2019   Vitamin D deficiency 03/23/2019   Bilateral carotid artery stenosis 12/15/2018   Moderate aortic valve stenosis 12/15/2018   BMI 45.0-49.9, adult ([place]) 11/10/2018   Cerebrovascular accident ([place]) 05/02/2016   Hemiparesis affecting right side as late effect of cerebrovascular accident ([place]) 05/01/2016   Brain tumor ([place]) 11/24/2014   Gastro-esophageal reflux disease without esophagitis 11/24/2014   Accident due to mechanical fall without injury 08/03/2014   Essential hypertension 06/13/2014   Mixed hyperlipidemia 06/13/2014   Unspecified sequelae of  cerebral infarction 06/13/2014   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/2014   Type 2 diabetes mellitus without complication ([place]) 19/50/9326   Neck mass 09/08/2013   Swimmer's ear 09/08/2013   Erectile dysfunction 01/03/2013   Prediabetes 01/03/2013    ONSET DATE: 05/09/2016  REFERRING DIAG: CVA (cerebral vascular accident) ([place])  THERAPY DIAG:  Unsteadiness on feet  Muscle weakness (generalized)  Difficulty in walking, not elsewhere classified  Rationale for Evaluation and Treatment Rehabilitation  SUBJECTIVE:  SUBJECTIVE STATEMENT: Pt reports falls, no aches or pain currently. He reports other updates/concerns. Caregiver present for session.  Pt accompanied by: family member  PERTINENT HISTORY: History of CVA Feb 2018. He was previously fully independent, but this stroke caused significant deficits, mainly on the right side. He reports difficulty walking and transfers, uses a WC for most  His mom had to move in with him and now is his main caregiver. Patient had outpatient PT from November 2018-March 2022. Patient reports since being discharged from outpatient therapy he has kept up with his HEP daily. He does report desire for walking better and for getting in and out of bed easier. He reports his mom is helping with some ADLs. He reports needing help with rolling in bed. He reports his mom/aide assist with dressing. He has a home aide 3 days out of the week." PMH significant for hx of brain tumor, past CVA, and HTN. See chart for additional information.  PAIN:  Are you having pain? No  PRECAUTIONS: None  WEIGHT BEARING RESTRICTIONS No  PLOF: Independent  PATIENT GOALS: "to walk and sit-to-stand"   OBJECTIVE:   DIAGNOSTIC FINDINGS: None recent    TODAY'S TREATMENT:  10MWT: 0.14  m/seconds with RW  and WC follow. Rates difficult.  -seated LAQ, 5# AW, 1x15, 1x20 each LE.  -seated marches, 5#, AW, 2x15  each LE -Seated adductor squeeze with p.ball 15x with 3 sec holds. Continues to be difficult for pt.   -1x10, 1x6 sets STS from WC, BRW, recovery interval between.  Rates difficult with cuing for improved technique, up to min assist to correct posterior LOB.  NMR: Standing lateral weight-shifts at RW with CGA x multiple reps each side BUE support  Progressed to performing with only UUE support  Standing fwd/backward weight-shifts in semi-lunge position each LE x multiple reps  Comments: rates challenging  Static stand without UE support at RW 90 sec Static standing without UE support with vertical and horizontal head turns 10x for each. Rates difficult. Requires up to min assist to correct LOB Standing EC without UE support 30 sec  Comments: requires up to min assist throughout.  Pt takes brief restroom break during session     PATIENT EDUCATION: Education details: Pt educated throughout session about proper posture and technique with exercises. Improved exercise technique, movement at target joints, use of target muscles after min to mod verbal, visual, tactile cues. 10MWT Person educated: Patient Education method: Explanation, Demonstration, Tactile cues, and Verbal cues Education comprehension: verbalized understanding, returned demonstration, and verbal cues required   [place] updates on this date, pt to continue as previously given  11/19/21:  Pt instructed on locations that may carry ankle weights for exercises to be performed at home.  Instructed to continue with daily walking.    GOALS: Goals reviewed with patient? No  SHORT TERM GOALS: Target date: 11/19/2021  Pt will be independent with HEP in order to improve strength and balance in order to decrease fall risk and improve function at home. Baseline: 8/28: pt reports doing HEP every day Goal  status: INITIAL   LONG TERM GOALS: Target date: 12/31/2021  Pt will increase FOTO to at least 57 in order to demonstrate improvement in function related to mobility and QoL. Baseline: 7/3: 50; 8/28: deferred to next session Goal status: INITIAL  2.  Pt (< 50 yrs old) will complete 5xSTS test < 20 seconds indicating an increase in LE strength and improved balance. Baseline: 7/3: 26mn:01seconds, 8/28:  40.5 seconds Goal status: INITIAL  3.  Pt will increase 10MWT to > 0.5 m/s (30 sec) as to improve gait speed for better home ambulation and reduce fall risk. Baseline: 7/3: 0.11 m/s with bRW; 8/30: 0.14 m/s with RW and WC follow  Goal status: INITIAL  4.  Pt will increase BLE gross strength to 4+/5 as to improve functional strenth for independent gait, increased standing tolerance, and increased ADL ability. Baseline: 7/3: grossly 4-/5 (see strength section); 8/28: 4+/5  Goal status: MET  5.  Pt will ambulate 250' with LRAD and supervision with no rest breaks to allow for improved mobility within home and increased independence. Baseline: 7/3: 60.3'; 8/28: 97 ft Goal status: INITIAL  ASSESSMENT:  CLINICAL IMPRESSION:  Further goal testing completed. Pt with improved score on 10MWT, indicating increased gait speed/ability. While pt shows progress, he rated all balance interventions as challenging and required up to min assist to correct for LOB. Pt will continue to benefit from skilled therapy in order to address deficits of gait, strength, balance and mobility.       OBJECTIVE IMPAIRMENTS Abnormal gait, decreased activity tolerance, decreased balance, decreased coordination, decreased endurance, decreased mobility, difficulty walking, decreased ROM, decreased strength, postural dysfunction, and obesity.   ACTIVITY LIMITATIONS carrying, lifting, bending, sitting, standing, squatting, stairs, transfers, bed mobility, dressing, and locomotion level  PARTICIPATION LIMITATIONS: cleaning,  laundry, interpersonal relationship, driving, and community activity  PERSONAL FACTORS Past/current experiences, Time since onset of injury/illness/exacerbation, and 3+ comorbidities: hx of brain tumor, past CVA, HTN, obesity, arthritis  are also affecting patient's functional outcome.   REHAB POTENTIAL: Good  CLINICAL DECISION MAKING: Stable/uncomplicated  EVALUATION COMPLEXITY: Low  PLAN: PT FREQUENCY: 2x/week  PT DURATION: 12 weeks  PLANNED INTERVENTIONS: Therapeutic exercises, Therapeutic activity, Neuromuscular re-education, Balance training, Gait training, Patient/Family education, Joint mobilization, Stair training, Vestibular training, Orthotic/Fit training, DME instructions, Dry Needling, Electrical stimulation, Wheelchair mobility training, Cryotherapy, Moist heat, Compression bandaging, Manual therapy, and Re-evaluation  PLAN FOR NEXT SESSION: strengthening, gait, endurance training, continue plan    Zollie Pee, PT, DPT 12/06/2021, 8:57 AM

## 2021-12-12 ENCOUNTER — Ambulatory Visit: Payer: Medicare Other | Attending: Family Medicine

## 2021-12-12 DIAGNOSIS — I639 Cerebral infarction, unspecified: Secondary | ICD-10-CM | POA: Diagnosis present

## 2021-12-12 DIAGNOSIS — R2681 Unsteadiness on feet: Secondary | ICD-10-CM | POA: Diagnosis present

## 2021-12-12 DIAGNOSIS — R262 Difficulty in walking, not elsewhere classified: Secondary | ICD-10-CM | POA: Insufficient documentation

## 2021-12-12 DIAGNOSIS — R296 Repeated falls: Secondary | ICD-10-CM | POA: Insufficient documentation

## 2021-12-12 DIAGNOSIS — R531 Weakness: Secondary | ICD-10-CM | POA: Insufficient documentation

## 2021-12-12 DIAGNOSIS — I69351 Hemiplegia and hemiparesis following cerebral infarction affecting right dominant side: Secondary | ICD-10-CM | POA: Diagnosis present

## 2021-12-12 DIAGNOSIS — R278 Other lack of coordination: Secondary | ICD-10-CM | POA: Insufficient documentation

## 2021-12-12 DIAGNOSIS — R2689 Other abnormalities of gait and mobility: Secondary | ICD-10-CM | POA: Insufficient documentation

## 2021-12-12 DIAGNOSIS — M6281 Muscle weakness (generalized): Secondary | ICD-10-CM | POA: Insufficient documentation

## 2021-12-12 DIAGNOSIS — R269 Unspecified abnormalities of gait and mobility: Secondary | ICD-10-CM | POA: Insufficient documentation

## 2021-12-12 NOTE — Therapy (Signed)
OUTPATIENT PHYSICAL THERAPY NEURO TREATMENT NOTE  Patient Name: Curtis Powell MRN: [id number] DOB:Aug 29, 1965, 56 y.o., male Today's Date: 12/12/2021   PCP: Valera Castle, MD REFERRING PROVIDER: Valera Castle, MD   PT End of Session - 12/12/21 1301     Visit Number 76    Number of Visits 40    Date for PT Re-Evaluation 12/31/21    Authorization Type UHC Medicare; [place] Medicaid    Authorization Time Period 10/08/21-12/31/21    PT Start Time 1303    PT Stop Time 1343    PT Time Calculation (min) 40 min    Equipment Utilized During Treatment Gait belt    Activity Tolerance Patient tolerated treatment well;No increased pain    Behavior During Therapy WFL for tasks assessed/performed                    Past Medical History:  Diagnosis Date   Asthma    GERD (gastroesophageal reflux disease)    Hyperlipidemia    Hypertension    Obesity    Stroke [facility])    Past Surgical History:  Procedure Laterality Date   BRAIN SURGERY     Patient Active Problem List   Diagnosis Date Noted   Impaired glucose tolerance 03/23/2020   Learning disability 03/23/2020   Urinary and fecal incontinence 06/14/2019   Chronic pain syndrome 03/23/2019   Constipation 03/23/2019   Dysarthria 03/23/2019   Dysphagia 03/23/2019   Learning difficulty 03/23/2019   Morbid obesity ([place]) 03/23/2019   Muscle weakness 03/23/2019   Vitamin D deficiency 03/23/2019   Bilateral carotid artery stenosis 12/15/2018   Moderate aortic valve stenosis 12/15/2018   BMI 45.0-49.9, adult ([place]) 11/10/2018   Cerebrovascular accident ([place]) 05/02/2016   Hemiparesis affecting right side as late effect of cerebrovascular accident ([employer]) 05/01/2016   Brain tumor ([place]) 11/24/2014   Gastro-esophageal reflux disease without esophagitis 11/24/2014   Accident due to mechanical fall without injury 08/03/2014   Essential hypertension 06/13/2014   Mixed hyperlipidemia 06/13/2014   Unspecified sequelae of  cerebral infarction 06/13/2014   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/2014   Type 2 diabetes mellitus without complication ([place]) 18/84/1660   Neck mass 09/08/2013   Swimmer's ear 09/08/2013   Erectile dysfunction 01/03/2013   Prediabetes 01/03/2013    ONSET DATE: 05/09/2016  REFERRING DIAG: CVA (cerebral vascular accident) ([place])  THERAPY DIAG:  Other lack of coordination  Difficulty in walking, not elsewhere classified  Muscle weakness (generalized)  Unsteadiness on feet  Rationale for Evaluation and Treatment Rehabilitation  SUBJECTIVE:  SUBJECTIVE STATEMENT: Pt reports no pain, stumbles or falls. No other changes or concerns reported.  Pt accompanied by: family member  PERTINENT HISTORY: History of CVA Feb 2018. He was previously fully independent, but this stroke caused significant deficits, mainly on the right side. He reports difficulty walking and transfers, uses a WC for most  His mom had to move in with him and now is his main caregiver. Patient had outpatient PT from November 2018-March 2022. Patient reports since being discharged from outpatient therapy he has kept up with his HEP daily. He does report desire for walking better and for getting in and out of bed easier. He reports his mom is helping with some ADLs. He reports needing help with rolling in bed. He reports his mom/aide assist with dressing. He has a home aide 3 days out of the week." PMH significant for hx of brain tumor, past CVA, and HTN. See chart for additional information.  PAIN:  Are you having pain? No  PRECAUTIONS: None  WEIGHT BEARING RESTRICTIONS No  PLOF: Independent  PATIENT GOALS: "to walk and sit-to-stand"   OBJECTIVE:   DIAGNOSTIC FINDINGS: None recent    TODAY'S TREATMENT:  STS 5x. Requires multiple attempts  today to complete initial reps. Tendency for posterior LOB into WC. Cuing for improved eccentric control. Pt shows improvement following cues.  -Marching in place at standard walker 3x10 alt LE. Rates medium  Amb. With RW and WC follow 141 ft. Limited by UE fatigue not LE fatigue (heavy WB through BUEs on RW).   -seated LAQ, 5# AW 2x20 each LE. Cuing for increased knee ext B. -seated marches, 5#, AW, 2x20 each LE -Seated adductor squeeze with p.ball 1x15 with 3 sec holds. Continues to be difficult for pt.      PATIENT EDUCATION: Education details: Pt educated throughout session about proper posture and technique with exercises. Improved exercise technique, movement at target joints, use of target muscles after min to mod verbal, visual, tactile cues.  Person educated: Patient Education method: Explanation, Demonstration, Tactile cues, and Verbal cues Education comprehension: verbalized understanding, returned demonstration, and verbal cues required   [place] updates on this date, pt to continue as previously given  11/19/21:  Pt instructed on locations that may carry ankle weights for exercises to be performed at home.  Instructed to continue with daily walking.    GOALS: Goals reviewed with patient? No  SHORT TERM GOALS: Target date: 11/19/2021  Pt will be independent with HEP in order to improve strength and balance in order to decrease fall risk and improve function at home. Baseline: 8/28: pt reports doing HEP every day Goal status: INITIAL   LONG TERM GOALS: Target date: 12/31/2021  Pt will increase FOTO to at least 57 in order to demonstrate improvement in function related to mobility and QoL. Baseline: 7/3: 50; 8/28: deferred to next session Goal status: INITIAL  2.  Pt (< 71 yrs old) will complete 5xSTS test < 20 seconds indicating an increase in LE strength and improved balance. Baseline: 7/3: 6mn:01seconds, 8/28: 40.5 seconds Goal status:  INITIAL  3.  Pt will increase 10MWT to > 0.5 m/s (30 sec) as to improve gait speed for better home ambulation and reduce fall risk. Baseline: 7/3: 0.11 m/s with bRW; 8/30: 0.14 m/s with RW and WC follow  Goal status: INITIAL  4.  Pt will increase BLE gross strength to 4+/5 as to improve functional strenth for independent gait, increased standing tolerance, and increased ADL  ability. Baseline: 7/3: grossly 4-/5 (see strength section); 8/28: 4+/5  Goal status: MET  5.  Pt will ambulate 250' with LRAD and supervision with no rest breaks to allow for improved mobility within home and increased independence. Baseline: 7/3: 60.3'; 8/28: 97 ft Goal status: INITIAL  ASSESSMENT:  CLINICAL IMPRESSION:  Pt shows progress by ambulating for 141 ft with RW and WC-follow. He was limited by fatigue in BUE due to heavy weightbearing through his arms, but reported no fatigue with his BLEs. This indicates improved LE muscular endurance. Will continue to focus on strengthening BLEs and balance in order to decrease reliance on upper extremities with gait and in standing. Pt will continue to benefit from skilled therapy in order to address deficits of gait, strength, balance and mobility.       OBJECTIVE IMPAIRMENTS Abnormal gait, decreased activity tolerance, decreased balance, decreased coordination, decreased endurance, decreased mobility, difficulty walking, decreased ROM, decreased strength, postural dysfunction, and obesity.   ACTIVITY LIMITATIONS carrying, lifting, bending, sitting, standing, squatting, stairs, transfers, bed mobility, dressing, and locomotion level  PARTICIPATION LIMITATIONS: cleaning, laundry, interpersonal relationship, driving, and community activity  PERSONAL FACTORS Past/current experiences, Time since onset of injury/illness/exacerbation, and 3+ comorbidities: hx of brain tumor, past CVA, HTN, obesity, arthritis  are also affecting patient's functional outcome.   REHAB  POTENTIAL: Good  CLINICAL DECISION MAKING: Stable/uncomplicated  EVALUATION COMPLEXITY: Low  PLAN: PT FREQUENCY: 2x/week  PT DURATION: 12 weeks  PLANNED INTERVENTIONS: Therapeutic exercises, Therapeutic activity, Neuromuscular re-education, Balance training, Gait training, Patient/Family education, Joint mobilization, Stair training, Vestibular training, Orthotic/Fit training, DME instructions, Dry Needling, Electrical stimulation, Wheelchair mobility training, Cryotherapy, Moist heat, Compression bandaging, Manual therapy, and Re-evaluation  PLAN FOR NEXT SESSION: strengthening, gait, endurance training, continue plan    Zollie Pee, PT, DPT 12/12/2021, 4:57 PM

## 2021-12-19 ENCOUNTER — Ambulatory Visit: Payer: Medicare HMO | Admitting: Family Medicine

## 2021-12-19 ENCOUNTER — Ambulatory Visit: Payer: Medicare Other

## 2021-12-19 DIAGNOSIS — R278 Other lack of coordination: Secondary | ICD-10-CM | POA: Diagnosis not present

## 2021-12-19 DIAGNOSIS — R262 Difficulty in walking, not elsewhere classified: Secondary | ICD-10-CM

## 2021-12-19 DIAGNOSIS — M6281 Muscle weakness (generalized): Secondary | ICD-10-CM

## 2021-12-19 DIAGNOSIS — R2681 Unsteadiness on feet: Secondary | ICD-10-CM

## 2021-12-19 NOTE — Therapy (Signed)
OUTPATIENT PHYSICAL THERAPY NEURO TREATMENT NOTE  Patient Name: Curtis Powell MRN: [id number] DOB:[birth year], 56 y.o., male Today's Date: 12/19/2021   PCP: Valera Castle, MD REFERRING PROVIDER: Valera Castle, MD   PT End of Session - 12/19/21 1255     Visit Number 45    Number of Visits 23    Date for PT Re-Evaluation 12/31/21    Authorization Type UHC Medicare;  Medicaid    Authorization Time Period 10/08/21-12/31/21    PT Start Time 1257    PT Stop Time 1339    PT Time Calculation (min) 42 min    Equipment Utilized During Treatment Gait belt    Activity Tolerance Patient tolerated treatment well;No increased pain    Behavior During Therapy WFL for tasks assessed/performed                    Past Medical History:  Diagnosis Date   Asthma    GERD (gastroesophageal reflux disease)    Hyperlipidemia    Hypertension    Obesity    Stroke [facility])    Past Surgical History:  Procedure Laterality Date   BRAIN SURGERY     Patient Active Problem List   Diagnosis Date Noted   Impaired glucose tolerance 03/23/2020   Learning disability 03/23/2020   Urinary and fecal incontinence 06/14/2019   Chronic pain syndrome 03/23/2019   Constipation 03/23/2019   Dysarthria 03/23/2019   Dysphagia 03/23/2019   Learning difficulty 03/23/2019   Morbid obesity ([place]) 03/23/2019   Muscle weakness 03/23/2019   Vitamin D deficiency 03/23/2019   Bilateral carotid artery stenosis 12/15/2018   Moderate aortic valve stenosis 12/15/2018   BMI 45.0-49.9, adult ([place]) 11/10/2018   Cerebrovascular accident ([place]) 05/02/2016   Hemiparesis affecting right side as late effect of cerebrovascular accident ([place]) 05/01/2016   Brain tumor ([place]) 11/24/2014   Gastro-esophageal reflux disease without esophagitis 11/24/2014   Accident due to mechanical fall without injury 08/03/2014   Essential hypertension 06/13/2014   Mixed hyperlipidemia 06/13/2014   Unspecified sequelae of  cerebral infarction 06/13/2014   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/2014   Type 2 diabetes mellitus without complication ([place]) 22/58/3462   Neck mass 09/08/2013   Swimmer's ear 09/08/2013   Erectile dysfunction 01/03/2013   Prediabetes 01/03/2013    ONSET DATE: 05/09/2016  REFERRING DIAG: CVA (cerebral vascular accident) ([place])  THERAPY DIAG:  Muscle weakness (generalized)  Unsteadiness on feet  Difficulty in walking, not elsewhere classified  Rationale for Evaluation and Treatment Rehabilitation  SUBJECTIVE:  SUBJECTIVE STATEMENT Pt reports no pain currently, and denies any recent stumbles/falls. Reports HEP has been going OK. His caregiver does report he went to dr other day due to feeling unwell. Pt taking albuterol.   Pt accompanied by: family member  PERTINENT HISTORY: History of CVA Feb 2018. He was previously fully independent, but this stroke caused significant deficits, mainly on the right side. He reports difficulty walking and transfers, uses a WC for most  His mom had to move in with him and now is his main caregiver. Patient had outpatient PT from November 2018-March 2022. Patient reports since being discharged from outpatient therapy he has kept up with his HEP daily. He does report desire for walking better and for getting in and out of bed easier. He reports his mom is helping with some ADLs. He reports needing help with rolling in bed. He reports his mom/aide assist with dressing. He has a home aide 3 days out of the week." PMH significant for hx of brain tumor, past CVA, and HTN. See chart for additional information.  PAIN:  Are you having pain? No  PRECAUTIONS: None  WEIGHT BEARING RESTRICTIONS No  PLOF: Independent  PATIENT GOALS: "to walk and sit-to-stand"   OBJECTIVE:   DIAGNOSTIC  FINDINGS: None recent    TODAY'S TREATMENT:  -seated LAQ, 6.5# AW 2x15 each LE. Cuing for increased knee ext B. Rates medium -seated marches, 6.5#, AW, 2x15 each LE. Rates hard  - Seated side tap on to hedgehog 15x each LE with 6.5# weights. Difficult, harder on RLE compared to LLE  -Seated adductor squeeze with p.ball 1x15 with 3 sec holds. Continues to be difficult for pt.   STS 8x. Fatiguing  -Marching in place at standard walker 2x10 alt LE with 5# weights. SPO2% remains 96% or greater.  Amb. With RW and WC follow 103 ft total with one seated rest break at 45 ft. Exhibits increased R foot drag today. SPO2% remains 96% or greater.     PATIENT EDUCATION: Education details: Pt educated throughout session about proper posture and technique with exercises. Improved exercise technique, movement at target joints, use of target muscles after min to mod verbal, visual, tactile cues.  Person educated: Patient Education method: Explanation, Demonstration, Tactile cues, and Verbal cues Education comprehension: verbalized understanding, returned demonstration, and verbal cues required   [place] updates on this date, pt to continue as previously given  11/19/21:  Pt instructed on locations that may carry ankle weights for exercises to be performed at home.  Instructed to continue with daily walking.    GOALS: Goals reviewed with patient? No  SHORT TERM GOALS: Target date: 11/19/2021  Pt will be independent with HEP in order to improve strength and balance in order to decrease fall risk and improve function at home. Baseline: 8/28: pt reports doing HEP every day Goal status: INITIAL   LONG TERM GOALS: Target date: 12/31/2021  Pt will increase FOTO to at least 57 in order to demonstrate improvement in function related to mobility and QoL. Baseline: 7/3: 50; 8/28: deferred to next session Goal status: INITIAL  2.  Pt (< 21 yrs old) will complete 5xSTS test < 20  seconds indicating an increase in LE strength and improved balance. Baseline: 7/3: 44mn:01seconds, 8/28: 40.5 seconds Goal status: INITIAL  3.  Pt will increase 10MWT to > 0.5 m/s (30 sec) as to improve gait speed for better home ambulation and reduce fall risk. Baseline: 7/3: 0.11 m/s with bRW; 8/30:  0.14 m/s with RW and WC follow  Goal status: INITIAL  4.  Pt will increase BLE gross strength to 4+/5 as to improve functional strenth for independent gait, increased standing tolerance, and increased ADL ability. Baseline: 7/3: grossly 4-/5 (see strength section); 8/28: 4+/5  Goal status: MET  5.  Pt will ambulate 250' with LRAD and supervision with no rest breaks to allow for improved mobility within home and increased independence. Baseline: 7/3: 60.3'; 8/28: 97 ft Goal status: INITIAL  ASSESSMENT:  CLINICAL IMPRESSION: Pt presents somewhat more fatigued compared to previous session, ambulating 103 ft today with one rest break at 45 ft. SPO2% was normal throughout. Although pt slightly more fatigued, he was able to perform seated therex with increased weights, indicating modest increase in LE strength. Pt will continue to benefit from skilled therapy in order to address deficits of gait, strength, balance and mobility.       OBJECTIVE IMPAIRMENTS Abnormal gait, decreased activity tolerance, decreased balance, decreased coordination, decreased endurance, decreased mobility, difficulty walking, decreased ROM, decreased strength, postural dysfunction, and obesity.   ACTIVITY LIMITATIONS carrying, lifting, bending, sitting, standing, squatting, stairs, transfers, bed mobility, dressing, and locomotion level  PARTICIPATION LIMITATIONS: cleaning, laundry, interpersonal relationship, driving, and community activity  PERSONAL FACTORS Past/current experiences, Time since onset of injury/illness/exacerbation, and 3+ comorbidities: hx of brain tumor, past CVA, HTN, obesity, arthritis  are also  affecting patient's functional outcome.   REHAB POTENTIAL: Good  CLINICAL DECISION MAKING: Stable/uncomplicated  EVALUATION COMPLEXITY: Low  PLAN: PT FREQUENCY: 2x/week  PT DURATION: 12 weeks  PLANNED INTERVENTIONS: Therapeutic exercises, Therapeutic activity, Neuromuscular re-education, Balance training, Gait training, Patient/Family education, Joint mobilization, Stair training, Vestibular training, Orthotic/Fit training, DME instructions, Dry Needling, Electrical stimulation, Wheelchair mobility training, Cryotherapy, Moist heat, Compression bandaging, Manual therapy, and Re-evaluation  PLAN FOR NEXT SESSION: strengthening, gait, endurance training, continue plan    Zollie Pee, PT, DPT 12/19/2021, 1:52 PM

## 2021-12-24 ENCOUNTER — Ambulatory Visit: Payer: Medicare Other

## 2021-12-26 ENCOUNTER — Ambulatory Visit: Payer: Medicare Other

## 2021-12-26 DIAGNOSIS — R2681 Unsteadiness on feet: Secondary | ICD-10-CM

## 2021-12-26 DIAGNOSIS — R278 Other lack of coordination: Secondary | ICD-10-CM | POA: Diagnosis not present

## 2021-12-26 DIAGNOSIS — M6281 Muscle weakness (generalized): Secondary | ICD-10-CM

## 2021-12-26 NOTE — Therapy (Signed)
OUTPATIENT PHYSICAL THERAPY NEURO TREATMENT NOTE  Patient Name: Curtis Powell MRN: [id number] DOB:April 30, 1965, 56 y.o., male Today's Date: 12/26/2021   PCP: Valera Castle, MD REFERRING PROVIDER: Valera Castle, MD   PT End of Session - 12/26/21 1359     Visit Number 52    Number of Visits 58    Date for PT Re-Evaluation 12/31/21    Authorization Type UHC Medicare; [place] Medicaid    Authorization Time Period 10/08/21-12/31/21    PT Start Time 1313    PT Stop Time 1348    PT Time Calculation (min) 35 min    Equipment Utilized During Treatment Gait belt    Activity Tolerance Patient tolerated treatment well;No increased pain    Behavior During Therapy WFL for tasks assessed/performed                     Past Medical History:  Diagnosis Date   Asthma    GERD (gastroesophageal reflux disease)    Hyperlipidemia    Hypertension    Obesity    Stroke [facility])    Past Surgical History:  Procedure Laterality Date   BRAIN SURGERY     Patient Active Problem List   Diagnosis Date Noted   Impaired glucose tolerance 03/23/2020   Learning disability 03/23/2020   Urinary and fecal incontinence 06/14/2019   Chronic pain syndrome 03/23/2019   Constipation 03/23/2019   Dysarthria 03/23/2019   Dysphagia 03/23/2019   Learning difficulty 03/23/2019   Morbid obesity ([place]) 03/23/2019   Muscle weakness 03/23/2019   Vitamin D deficiency 03/23/2019   Bilateral carotid artery stenosis 12/15/2018   Moderate aortic valve stenosis 12/15/2018   BMI 45.0-49.9, adult ([place]) 11/10/2018   Cerebrovascular accident ([place]) 05/02/2016   Hemiparesis affecting right side as late effect of cerebrovascular accident ([place]) 05/01/2016   Brain tumor ([name]) 11/24/2014   Gastro-esophageal reflux disease without esophagitis 11/24/2014   Accident due to mechanical fall without injury 08/03/2014   Essential hypertension 06/13/2014   Mixed hyperlipidemia 06/13/2014   Unspecified sequelae of  cerebral infarction 06/13/2014   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/2014   Type 2 diabetes mellitus without complication ([place]) 15/37/9432   Neck mass 09/08/2013   Swimmer's ear 09/08/2013   Erectile dysfunction 01/03/2013   Prediabetes 01/03/2013    ONSET DATE: 05/09/2016  REFERRING DIAG: CVA (cerebral vascular accident) ([place])  THERAPY DIAG:  Muscle weakness (generalized)  Unsteadiness on feet  Other lack of coordination  Rationale for Evaluation and Treatment Rehabilitation  SUBJECTIVE:  SUBJECTIVE STATEMENT Pt late to session. Pt feeling better than previous appointment. Pt recently saw primary care dr and says it went well. Pt reports he has been practicing deep breathing due to hx of bronchitis. Reports no falls/stumbles or pain currently.   Pt accompanied by: family member  PERTINENT HISTORY: History of CVA Feb 2018. He was previously fully independent, but this stroke caused significant deficits, mainly on the right side. He reports difficulty walking and transfers, uses a WC for most  His mom had to move in with him and now is his main caregiver. Patient had outpatient PT from November 2018-March 2022. Patient reports since being discharged from outpatient therapy he has kept up with his HEP daily. He does report desire for walking better and for getting in and out of bed easier. He reports his mom is helping with some ADLs. He reports needing help with rolling in bed. He reports his mom/aide assist with dressing. He has a home aide 3 days out of the week." PMH significant for hx of brain tumor, past CVA, and HTN. See chart for additional information.  PAIN:  Are you having pain? No  PRECAUTIONS: None  WEIGHT BEARING RESTRICTIONS No  PLOF: Independent  PATIENT GOALS: "to walk and  sit-to-stand"   OBJECTIVE:   DIAGNOSTIC FINDINGS: None recent    TODAY'S TREATMENT:   7.5# weights donned -seated LAQ 2x10 each LE.  -seated marches 2x12 each LE. Rates medium today (improved from last session) - Seated side tap on to green balance pad 10x each LE . Difficult  At RW: Standing forward tap onto green airex pad alt LE. 2x7 with 1 instance of posterior LOB into chair (assisted by PT)  Amb with RW and WC follow for endurance 1x93 ft.    Seated adductor squeeze with p.ball 1x15 with 3 sec holds. Pt reports this intervention feels easier today.  Assisted by tech during session to tighten handles on pt's WC as they were loose.   PATIENT EDUCATION: Education details: Pt educated throughout session about proper posture and technique with exercises. Improved exercise technique, movement at target joints, use of target muscles after min to mod verbal, visual, tactile cues.  Person educated: Patient Education method: Explanation, Demonstration, Tactile cues, and Verbal cues Education comprehension: verbalized understanding, returned demonstration, and verbal cues required   [place] updates on this date, pt to continue as previously given  11/19/21:  Pt instructed on locations that may carry ankle weights for exercises to be performed at home.  Instructed to continue with daily walking.    GOALS: Goals reviewed with patient? No  SHORT TERM GOALS: Target date: 11/19/2021  Pt will be independent with HEP in order to improve strength and balance in order to decrease fall risk and improve function at home. Baseline: 8/28: pt reports doing HEP every day Goal status: INITIAL   LONG TERM GOALS: Target date: 12/31/2021  Pt will increase FOTO to at least 57 in order to demonstrate improvement in function related to mobility and QoL. Baseline: 7/3: 50; 8/28: deferred to next session Goal status: INITIAL  2.  Pt (< 74 yrs old) will complete 5xSTS test <  20 seconds indicating an increase in LE strength and improved balance. Baseline: 7/3: 27mn:01seconds, 8/28: 40.5 seconds Goal status: INITIAL  3.  Pt will increase 10MWT to > 0.5 m/s (30 sec) as to improve gait speed for better home ambulation and reduce fall risk. Baseline: 7/3: 0.11 m/s with bRW; 8/30: 0.14 m/s  with RW and WC follow  Goal status: INITIAL  4.  Pt will increase BLE gross strength to 4+/5 as to improve functional strenth for independent gait, increased standing tolerance, and increased ADL ability. Baseline: 7/3: grossly 4-/5 (see strength section); 8/28: 4+/5  Goal status: MET  5.  Pt will ambulate 250' with LRAD and supervision with no rest breaks to allow for improved mobility within home and increased independence. Baseline: 7/3: 60.3'; 8/28: 97 ft Goal status: INITIAL  ASSESSMENT:  CLINICAL IMPRESSION: Pt presents feeling better today compared to last session. He was able to advance level of resistance used with therex with lower ratings of exertion, indicating improved BLE strength. While pt shows progress, he still fatigues quickly with ambulation and requires up to min assist to correct for increased posterior-shift in weightbearing. Pt will continue to benefit from skilled therapy in order to address deficits of gait, strength, balance and mobility.       OBJECTIVE IMPAIRMENTS Abnormal gait, decreased activity tolerance, decreased balance, decreased coordination, decreased endurance, decreased mobility, difficulty walking, decreased ROM, decreased strength, postural dysfunction, and obesity.   ACTIVITY LIMITATIONS carrying, lifting, bending, sitting, standing, squatting, stairs, transfers, bed mobility, dressing, and locomotion level  PARTICIPATION LIMITATIONS: cleaning, laundry, interpersonal relationship, driving, and community activity  PERSONAL FACTORS Past/current experiences, Time since onset of injury/illness/exacerbation, and 3+ comorbidities: hx of  brain tumor, past CVA, HTN, obesity, arthritis  are also affecting patient's functional outcome.   REHAB POTENTIAL: Good  CLINICAL DECISION MAKING: Stable/uncomplicated  EVALUATION COMPLEXITY: Low  PLAN: PT FREQUENCY: 2x/week  PT DURATION: 12 weeks  PLANNED INTERVENTIONS: Therapeutic exercises, Therapeutic activity, Neuromuscular re-education, Balance training, Gait training, Patient/Family education, Joint mobilization, Stair training, Vestibular training, Orthotic/Fit training, DME instructions, Dry Needling, Electrical stimulation, Wheelchair mobility training, Cryotherapy, Moist heat, Compression bandaging, Manual therapy, and Re-evaluation  PLAN FOR NEXT SESSION: strengthening, gait, endurance training, continue plan    Zollie Pee, PT, DPT 12/26/2021, 2:00 PM

## 2021-12-31 ENCOUNTER — Ambulatory Visit: Payer: Medicare HMO | Admitting: Family Medicine

## 2022-01-02 ENCOUNTER — Ambulatory Visit: Payer: Medicare Other

## 2022-01-02 DIAGNOSIS — M6281 Muscle weakness (generalized): Secondary | ICD-10-CM

## 2022-01-02 DIAGNOSIS — R278 Other lack of coordination: Secondary | ICD-10-CM

## 2022-01-02 DIAGNOSIS — R269 Unspecified abnormalities of gait and mobility: Secondary | ICD-10-CM

## 2022-01-02 DIAGNOSIS — I639 Cerebral infarction, unspecified: Secondary | ICD-10-CM

## 2022-01-02 DIAGNOSIS — R262 Difficulty in walking, not elsewhere classified: Secondary | ICD-10-CM

## 2022-01-02 DIAGNOSIS — R531 Weakness: Secondary | ICD-10-CM

## 2022-01-02 DIAGNOSIS — R2681 Unsteadiness on feet: Secondary | ICD-10-CM

## 2022-01-02 DIAGNOSIS — I69351 Hemiplegia and hemiparesis following cerebral infarction affecting right dominant side: Secondary | ICD-10-CM

## 2022-01-02 DIAGNOSIS — R296 Repeated falls: Secondary | ICD-10-CM

## 2022-01-02 DIAGNOSIS — R2689 Other abnormalities of gait and mobility: Secondary | ICD-10-CM

## 2022-01-02 NOTE — Therapy (Signed)
OUTPATIENT PHYSICAL THERAPY NEURO TREATMENT NOTE/RECERTIFICATION  Patient Name: Curtis Powell MRN: [id number] DOB:10/26/1965, 56 y.o., male Today's Date: 01/03/2022   PCP: Valera Castle, MD REFERRING PROVIDER: Valera Castle, MD   PT End of Session - 01/02/22 1256     Visit Number 49    Number of Visits 109    Date for PT Re-Evaluation 03/27/22    Authorization Type UHC Medicare; [place] Medicaid    Authorization Time Period 10/08/21-12/31/21; Recert 10/05/5282-13/24/4010    Progress Note Due on Visit 90    PT Start Time 1300    PT Stop Time 1344    PT Time Calculation (min) 44 min    Equipment Utilized During Treatment Gait belt    Activity Tolerance Patient tolerated treatment well;No increased pain    Behavior During Therapy WFL for tasks assessed/performed                     Past Medical History:  Diagnosis Date   Asthma    GERD (gastroesophageal reflux disease)    Hyperlipidemia    Hypertension    Obesity    Stroke [facility])    Past Surgical History:  Procedure Laterality Date   BRAIN SURGERY     Patient Active Problem List   Diagnosis Date Noted   Impaired glucose tolerance 03/23/2020   Learning disability 03/23/2020   Urinary and fecal incontinence 06/14/2019   Chronic pain syndrome 03/23/2019   Constipation 03/23/2019   Dysarthria 03/23/2019   Dysphagia 03/23/2019   Learning difficulty 03/23/2019   Morbid obesity ([place]) 03/23/2019   Muscle weakness 03/23/2019   Vitamin D deficiency 03/23/2019   Bilateral carotid artery stenosis 12/15/2018   Moderate aortic valve stenosis 12/15/2018   BMI 45.0-49.9, adult ([place]) 11/10/2018   Cerebrovascular accident ([place]) 05/02/2016   Hemiparesis affecting right side as late effect of cerebrovascular accident ([place]) 05/01/2016   Brain tumor ([place]) 11/24/2014   Gastro-esophageal reflux disease without esophagitis 11/24/2014   Accident due to mechanical fall without injury 08/03/2014   Essential  hypertension 06/13/2014   Mixed hyperlipidemia 06/13/2014   Unspecified sequelae of cerebral infarction 06/13/2014   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/2014   Type 2 diabetes mellitus without complication ([place]) 27/25/3664   Neck mass 09/08/2013   Swimmer's ear 09/08/2013   Erectile dysfunction 01/03/2013   Prediabetes 01/03/2013    ONSET DATE: 05/09/2016  REFERRING DIAG: CVA (cerebral vascular accident) ([place])  THERAPY DIAG:  Muscle weakness (generalized)  Unsteadiness on feet  Other lack of coordination  Difficulty in walking, not elsewhere classified  Abnormality of gait and mobility  Repeated falls  Other abnormalities of gait and mobility  Left-sided weakness  Hemiplegia and hemiparesis following cerebral infarction affecting right dominant side ([facility])  Acute CVA (cerebrovascular accident) ([place])  Rationale for Evaluation and Treatment Rehabilitation  SUBJECTIVE:  SUBJECTIVE STATEMENT Pt reports feeling weak from being under the weather recently. He does report compliance with HEP and standing some and walking at home.  Pt accompanied by: family member  PERTINENT HISTORY: History of CVA Feb 2018. He was previously fully independent, but this stroke caused significant deficits, mainly on the right side. He reports difficulty walking and transfers, uses a WC for most  His mom had to move in with him and now is his main caregiver. Patient had outpatient PT from November 2018-March 2022. Patient reports since being discharged from outpatient therapy he has kept up with his HEP daily. He does report desire for walking better and for getting in and out of bed easier. He reports his mom is helping with some ADLs. He reports needing help with rolling in bed. He reports his mom/aide assist with dressing. He has  a home aide 3 days out of the week." PMH significant for hx of brain tumor, past CVA, and HTN. See chart for additional information.  PAIN:  Are you having pain? No  PRECAUTIONS: None  WEIGHT BEARING RESTRICTIONS No  PLOF: Independent  PATIENT GOALS: "to walk and sit-to-stand"   OBJECTIVE:   DIAGNOSTIC FINDINGS: None recent    TODAY'S TREATMENT:  Reassessed goals today for recert visit- See goal section   To warm up- prior to testing patient performed  -seated LAQ 2x10 each LE.  -seated marches 2x12 each LE.  - anterior forward lean x 12 reps   After testing patient performed self propulsion of w/c- with leg rest removed and instructed to use both LE to help forward advance his w/c approx 40 feet then backward x 40 feet. Patient required consistent VC to use right LE as he exhibited static LE knee ext on right rather than use the extremity to assist. He was able to improve today cues.    PATIENT EDUCATION: Education details: Pt educated throughout session about proper posture and technique with exercises. Improved exercise technique, movement at target joints, use of target muscles after min to mod verbal, visual, tactile cues.  Person educated: Patient Education method: Explanation, Demonstration, Tactile cues, and Verbal cues Education comprehension: verbalized understanding, returned demonstration, and verbal cues required   [place] updates on this date, pt to continue as previously given  11/19/21:  Pt instructed on locations that may carry ankle weights for exercises to be performed at home.  Instructed to continue with daily walking.    GOALS: Goals reviewed with patient? No  SHORT TERM GOALS: Target date: 11/19/2021  Pt will be independent with HEP in order to improve strength and balance in order to decrease fall risk and improve function at home. Baseline: 8/28: pt reports doing HEP every day; 01/02/2022= Patient reports performing seated LE  strengthening and some standing/walking at home. Goal status: GOAL MET   LONG TERM GOALS: Target date: 03/27/2022  Pt will increase FOTO to at least 57 in order to demonstrate improvement in function related to mobility and QoL. Baseline: 7/3: 50; 8/28: deferred to next session; 01/02/2022= 47% Goal status: ONGOING  2.  Pt (< 35 yrs old) will complete 5xSTS test < 20 seconds indicating an increase in LE strength and improved balance. Baseline: 7/3: 64mn:01seconds, 8/28: 40.5 seconds; 01/02/2022= difficulty with standing today- patient not feeling well and will retest next visit Goal status: ONGOING  3.  Pt will increase 10MWT to > 0.5 m/s (30 sec) as to improve gait speed for better home ambulation and reduce fall risk.  Baseline: 7/3: 0.11 m/s with bRW; 8/30: 0.14 m/s with RW and WC follow; 01/02/2022= 0.14 m/s with Bariatric RW Goal status: ONGOING   4.  Pt will increase BLE gross strength to 4+/5 as to improve functional strenth for independent gait, increased standing tolerance, and increased ADL ability. Baseline: 7/3: grossly 4-/5 (see strength section); 8/28: 4+/5  Goal status: MET  5.  Pt will ambulate 250' with LRAD and supervision with no rest breaks to allow for improved mobility within home and increased independence. Baseline: 7/3: 60.3'; 8/28: 97 ft; 01/02/2022= 75 feet with bariatric RW, CGA with close w/c follow- stopped today due to left hand pain and not feeling well.  Goal status: ONGOING  ASSESSMENT:  CLINICAL IMPRESSION: Patient presents not feeling well today as reflected in decreased performance with sit to stand and walking. Overall during this current certification patient has made progress as seen in treatment notes and progress report from 8/28. Despite having an "off" day today he has made some significant progress and will continue to benefit from skilled therapy in order to address deficits of gait, strength, balance and mobility. Patient's condition has the  potential to improve in response to therapy. Maximum improvement is yet to be obtained. The anticipated improvement is attainable and reasonable in a generally predictable time.        OBJECTIVE IMPAIRMENTS Abnormal gait, decreased activity tolerance, decreased balance, decreased coordination, decreased endurance, decreased mobility, difficulty walking, decreased ROM, decreased strength, postural dysfunction, and obesity.   ACTIVITY LIMITATIONS carrying, lifting, bending, sitting, standing, squatting, stairs, transfers, bed mobility, dressing, and locomotion level  PARTICIPATION LIMITATIONS: cleaning, laundry, interpersonal relationship, driving, and community activity  PERSONAL FACTORS Past/current experiences, Time since onset of injury/illness/exacerbation, and 3+ comorbidities: hx of brain tumor, past CVA, HTN, obesity, arthritis  are also affecting patient's functional outcome.   REHAB POTENTIAL: Good  CLINICAL DECISION MAKING: Stable/uncomplicated  EVALUATION COMPLEXITY: Low  PLAN: PT FREQUENCY: 2x/week  PT DURATION: 12 weeks  PLANNED INTERVENTIONS: Therapeutic exercises, Therapeutic activity, Neuromuscular re-education, Balance training, Gait training, Patient/Family education, Joint mobilization, Stair training, Vestibular training, Orthotic/Fit training, DME instructions, Dry Needling, Electrical stimulation, Wheelchair mobility training, Cryotherapy, Moist heat, Compression bandaging, Manual therapy, and Re-evaluation  PLAN FOR NEXT SESSION: strengthening, gait, endurance training, continue plan    Lewis Moccasin, PT 01/03/2022, 5:07 PM

## 2022-01-07 ENCOUNTER — Ambulatory Visit: Payer: Medicare Other | Attending: Family Medicine

## 2022-01-07 ENCOUNTER — Telehealth: Payer: Self-pay

## 2022-01-07 DIAGNOSIS — R2681 Unsteadiness on feet: Secondary | ICD-10-CM | POA: Insufficient documentation

## 2022-01-07 DIAGNOSIS — R269 Unspecified abnormalities of gait and mobility: Secondary | ICD-10-CM | POA: Insufficient documentation

## 2022-01-07 DIAGNOSIS — R278 Other lack of coordination: Secondary | ICD-10-CM | POA: Insufficient documentation

## 2022-01-07 DIAGNOSIS — R262 Difficulty in walking, not elsewhere classified: Secondary | ICD-10-CM | POA: Insufficient documentation

## 2022-01-07 DIAGNOSIS — I69351 Hemiplegia and hemiparesis following cerebral infarction affecting right dominant side: Secondary | ICD-10-CM | POA: Diagnosis present

## 2022-01-07 DIAGNOSIS — R531 Weakness: Secondary | ICD-10-CM | POA: Diagnosis present

## 2022-01-07 DIAGNOSIS — R296 Repeated falls: Secondary | ICD-10-CM | POA: Insufficient documentation

## 2022-01-07 DIAGNOSIS — R2689 Other abnormalities of gait and mobility: Secondary | ICD-10-CM | POA: Diagnosis present

## 2022-01-07 DIAGNOSIS — M6281 Muscle weakness (generalized): Secondary | ICD-10-CM | POA: Insufficient documentation

## 2022-01-07 NOTE — Therapy (Signed)
OUTPATIENT PHYSICAL THERAPY NEURO TREATMENT NOTE  Patient Name: Curtis Powell MRN: [id number] DOB:[birth year], 56 y.o., male Today's Date: 01/07/2022   PCP: Valera Castle, MD REFERRING PROVIDER: Valera Castle, MD   PT End of Session - 01/07/22 1353     Visit Number 74    Number of Visits 109    Date for PT Re-Evaluation 03/27/22    Authorization Type UHC Medicare; [place] Medicaid    Authorization Time Period 10/08/21-12/31/21; Recert 0/63/[id number]/73/9584    Progress Note Due on Visit 90    PT Start Time 1320    PT Stop Time 1349    PT Time Calculation (min) 29 min    Equipment Utilized During Treatment Gait belt    Activity Tolerance Patient tolerated treatment well;No increased pain    Behavior During Therapy WFL for tasks assessed/performed                      Past Medical History:  Diagnosis Date   Asthma    GERD (gastroesophageal reflux disease)    Hyperlipidemia    Hypertension    Obesity    Stroke [facility])    Past Surgical History:  Procedure Laterality Date   BRAIN SURGERY     Patient Active Problem List   Diagnosis Date Noted   Impaired glucose tolerance 03/23/2020   Learning disability 03/23/2020   Urinary and fecal incontinence 06/14/2019   Chronic pain syndrome 03/23/2019   Constipation 03/23/2019   Dysarthria 03/23/2019   Dysphagia 03/23/2019   Learning difficulty 03/23/2019   Morbid obesity ([place]) 03/23/2019   Muscle weakness 03/23/2019   Vitamin D deficiency 03/23/2019   Bilateral carotid artery stenosis 12/15/2018   Moderate aortic valve stenosis 12/15/2018   BMI 45.0-49.9, adult ([place]) 11/10/2018   Cerebrovascular accident ([place]) 05/02/2016   Hemiparesis affecting right side as late effect of cerebrovascular accident ([place]) 05/01/2016   Brain tumor ([place]) 11/24/2014   Gastro-esophageal reflux disease without esophagitis 11/24/2014   Accident due to mechanical fall without injury 08/03/2014   Essential hypertension 06/13/2014    Mixed hyperlipidemia 06/13/2014   Unspecified sequelae of cerebral infarction 06/13/2014   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 He reports no pain currently and no stumbles/falls.   Pt accompanied by: family member  PERTINENT HISTORY: History of CVA Feb 2018. He was previously fully independent, but this stroke caused significant deficits, mainly on the right side. He reports difficulty walking and transfers, uses a WC for most  His mom had to move in with him and now is his main caregiver. Patient had outpatient PT from November 2018-March 2022. Patient reports since being discharged from outpatient therapy he has kept up with his HEP daily. He does report desire for walking better and for getting in and out of bed easier. He reports his mom is helping with some ADLs. He reports needing help with rolling in bed. He reports his mom/aide assist with dressing. He has a home aide 3 days out of the week." PMH significant for hx of brain tumor, past CVA, and HTN. See chart for additional information.  PAIN:  Are you having pain? No  PRECAUTIONS: None  WEIGHT BEARING RESTRICTIONS No  PLOF: Independent  PATIENT GOALS: "to walk and sit-to-stand"   OBJECTIVE:   DIAGNOSTIC FINDINGS: None recent    TODAY'S TREATMENT:  TherEx   STS with min assist 1x6, 1x5 Pt with  frequent posterior LOB into chair. Cuing for hip ext to improve stability with sit>stand.  Elastogel donned while pt performs the following (no adverse reaction to treatment reported or observed)  Seated with 5# weights donned each LE: LAQ 2x15 March 2x15  WC hamstring walks 2x24 ft. Fatiguing   Due to decreased stability today PT instructed pt and his caregiver to watch for symptoms and to call pt's physician should he start feeling bad again. Both verbalized understanding.   PATIENT EDUCATION: Education details: Pt educated throughout session about proper posture and technique with exercises. Improved exercise technique, movement at target joints, use of target muscles after min to mod verbal, visual, tactile cues.  Person educated: Patient Education method: Explanation, Demonstration, Tactile cues, and Verbal cues Education comprehension: verbalized understanding, returned demonstration, and verbal cues required   [place] updates on this date, pt to continue as previously given  11/19/21:  Pt instructed on locations that may carry ankle weights for exercises to be performed at home.  Instructed to continue with daily walking.    GOALS: Goals reviewed with patient? No  SHORT TERM GOALS: Target date: 11/19/2021  Pt will be independent with HEP in order to improve strength and balance in order to decrease fall risk and improve function at home. Baseline: 8/28: pt reports doing HEP every day; 01/02/2022= Patient reports performing seated LE strengthening and some standing/walking at home. Goal status: GOAL MET   LONG TERM GOALS: Target date: 03/27/2022  Pt will increase FOTO to at least 57 in order to demonstrate improvement in function related to mobility and QoL. Baseline: 7/3: 50; 8/28: deferred to next session; 01/02/2022= 47% Goal status: ONGOING  2.  Pt (< 27 yrs old) will complete 5xSTS test < 20 seconds indicating an increase in LE strength and improved  balance. Baseline: 7/3: 25min:01seconds, 8/28: 40.5 seconds; 01/02/2022= difficulty with standing today- patient not feeling well and will retest next visit Goal status: ONGOING  3.  Pt will increase 10MWT to > 0.5 m/s (30 sec) as to improve gait speed for better home ambulation and reduce fall risk. Baseline: 7/3: 0.11 m/s with bRW; 8/30: 0.14 m/s with RW and WC follow; 01/02/2022= 0.14 m/s with Bariatric RW Goal status: ONGOING  4.  Pt will increase BLE gross strength to 4+/5 as to improve functional strenth for independent gait, increased standing tolerance, and increased ADL ability. Baseline: 7/3: grossly 4-/5 (see strength section); 8/28: 4+/5  Goal status: MET  5.  Pt will ambulate 250' with LRAD and supervision with no rest breaks to allow for improved mobility within home and increased independence. Baseline: 7/3: 60.3'; 8/28: 97 ft; 01/02/2022= 75 feet with bariatric RW, CGA with close w/c follow- stopped today due to left hand pain and not feeling well.  Goal status: ONGOING  ASSESSMENT:  CLINICAL IMPRESSION: Session limited secondary to pt late arrival due to transportation. Pt reports feeling better today, however, exhibits more frequent posterior LOB than usual into WC (PT providing min a to control descent into chair) while performing STS. Due to this decrease in balance session primarily focused on strengthening in a seated position in [facility], which pt tolerated well without pain or significant fatigue. The pt will benefit from further skilled PT to improve strength, gait, balance and mobility.      OBJECTIVE IMPAIRMENTS Abnormal gait, decreased activity tolerance, decreased balance, decreased coordination, decreased endurance, decreased mobility, difficulty walking, decreased ROM, decreased strength, postural dysfunction, and obesity.   ACTIVITY LIMITATIONS carrying, lifting, bending, sitting, standing, squatting, stairs, transfers, bed mobility, dressing, and locomotion  level  PARTICIPATION LIMITATIONS: cleaning, laundry, interpersonal relationship, driving, and community activity  PERSONAL FACTORS Past/current experiences, Time since onset of injury/illness/exacerbation, and 3+ comorbidities: hx of brain tumor, past CVA, HTN, obesity, arthritis  are also affecting patient's functional outcome.   REHAB POTENTIAL: Good  CLINICAL DECISION MAKING: Stable/uncomplicated  EVALUATION COMPLEXITY: Low  PLAN: PT FREQUENCY: 2x/week  PT DURATION: 12 weeks  PLANNED INTERVENTIONS: Therapeutic exercises, Therapeutic activity, Neuromuscular re-education, Balance training, Gait training, Patient/Family education, Joint mobilization, Stair training, Vestibular training, Orthotic/Fit training, DME instructions, Dry Needling, Electrical stimulation, Wheelchair mobility training, Cryotherapy, Moist heat, Compression bandaging, Manual therapy, and Re-evaluation  PLAN FOR NEXT SESSION: strengthening, gait, endurance training, continue plan    Zollie Pee, PT 01/07/2022, 1:55 PM

## 2022-01-09 ENCOUNTER — Ambulatory Visit: Payer: Medicare Other

## 2022-01-09 DIAGNOSIS — R531 Weakness: Secondary | ICD-10-CM

## 2022-01-09 DIAGNOSIS — R2689 Other abnormalities of gait and mobility: Secondary | ICD-10-CM

## 2022-01-09 DIAGNOSIS — R2681 Unsteadiness on feet: Secondary | ICD-10-CM

## 2022-01-09 DIAGNOSIS — M6281 Muscle weakness (generalized): Secondary | ICD-10-CM

## 2022-01-09 DIAGNOSIS — R278 Other lack of coordination: Secondary | ICD-10-CM

## 2022-01-09 DIAGNOSIS — R296 Repeated falls: Secondary | ICD-10-CM

## 2022-01-09 DIAGNOSIS — R262 Difficulty in walking, not elsewhere classified: Secondary | ICD-10-CM

## 2022-01-09 DIAGNOSIS — R269 Unspecified abnormalities of gait and mobility: Secondary | ICD-10-CM

## 2022-01-09 DIAGNOSIS — I69351 Hemiplegia and hemiparesis following cerebral infarction affecting right dominant side: Secondary | ICD-10-CM

## 2022-01-09 NOTE — Therapy (Signed)
OUTPATIENT PHYSICAL THERAPY NEURO TREATMENT NOTE  Patient Name: Curtis Powell MRN: [id number] DOB:[birth year], 56 y.o., male Today's Date: 01/09/2022   PCP: Valera Castle, MD REFERRING PROVIDER: Valera Castle, MD   PT End of Session - 01/09/22 1304     Visit Number 74    Number of Visits 109    Date for PT Re-Evaluation 03/27/22    Authorization Type UHC Medicare; [place] Medicaid    Authorization Time Period 10/08/21-12/31/21; Recert 9/83/3825-05/39/7673    Progress Note Due on Visit 90    PT Start Time 1300    PT Stop Time 1344    PT Time Calculation (min) 44 min    Equipment Utilized During Treatment Gait belt    Activity Tolerance Patient tolerated treatment well;No increased pain    Behavior During Therapy WFL for tasks assessed/performed                      Past Medical History:  Diagnosis Date   Asthma    GERD (gastroesophageal reflux disease)    Hyperlipidemia    Hypertension    Obesity    Stroke [facility])    Past Surgical History:  Procedure Laterality Date   BRAIN SURGERY     Patient Active Problem List   Diagnosis Date Noted   Impaired glucose tolerance 03/23/2020   Learning disability 03/23/2020   Urinary and fecal incontinence 06/14/2019   Chronic pain syndrome 03/23/2019   Constipation 03/23/2019   Dysarthria 03/23/2019   Dysphagia 03/23/2019   Learning difficulty 03/23/2019   Morbid obesity ([place]) 03/23/2019   Muscle weakness 03/23/2019   Vitamin D deficiency 03/23/2019   Bilateral carotid artery stenosis 12/15/2018   Moderate aortic valve stenosis 12/15/2018   BMI 45.0-49.9, adult ([place]) 11/10/2018   Cerebrovascular accident ([place]) 05/02/2016   Hemiparesis affecting right side as late effect of cerebrovascular accident ([place]) 05/01/2016   Brain tumor ([place]) 11/24/2014   Gastro-esophageal reflux disease without esophagitis 11/24/2014   Accident due to mechanical fall without injury 08/03/2014   Essential hypertension 06/13/2014    Mixed hyperlipidemia 06/13/2014   Unspecified sequelae of cerebral infarction 06/13/2014   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/2014   Type 2 diabetes mellitus without complication ([place]) 41/93/7902   Neck mass 09/08/2013   Swimmer's ear 09/08/2013   Erectile dysfunction 01/03/2013   Prediabetes 01/03/2013    ONSET DATE: 05/09/2016  REFERRING DIAG: CVA (cerebral vascular accident) ([place])  THERAPY DIAG:  Unsteadiness on feet  Muscle weakness (generalized)  Other lack of coordination  Difficulty in walking, not elsewhere classified  Abnormality of gait and mobility  Repeated falls  Other abnormalities of gait and mobility  Left-sided weakness  Hemiplegia and hemiparesis following cerebral infarction affecting right dominant side ([facility])  Rationale for Evaluation and Treatment Rehabilitation  SUBJECTIVE:  SUBJECTIVE STATEMENT Pt reports no changes since last visit. Reports doing well and denies any pain/falls.   Pt accompanied by: family member  PERTINENT HISTORY: History of CVA Feb 2018. He was previously fully independent, but this stroke caused significant deficits, mainly on the right side. He reports difficulty walking and transfers, uses a WC for most  His mom had to move in with him and now is his main caregiver. Patient had outpatient PT from November 2018-March 2022. Patient reports since being discharged from outpatient therapy he has kept up with his HEP daily. He does report desire for walking better and for getting in and out of bed easier. He reports his mom is helping with some ADLs. He reports needing help with rolling in bed. He reports his mom/aide assist with dressing. He has a home aide 3 days out of the week." PMH significant for hx of brain tumor, past CVA, and HTN. See chart for  additional information.  PAIN:  Are you having pain? No  PRECAUTIONS: None  WEIGHT BEARING RESTRICTIONS No  PLOF: Independent  PATIENT GOALS: "to walk and sit-to-stand"   OBJECTIVE:   DIAGNOSTIC FINDINGS: None recent    TODAY'S TREATMENT:  TherEx    Seated with 2.5 # weights donned each LE: LAQ 2x15 March 2x15 Chair push up x 10 reps (goal to clear bottom off chair)  Abdominal crunch forward into lumbar ext x 15 reps  Scap row with GTB 2sets of 12 reps Horizontal shoulder abd - GTB 2 sets of 12 reps Ambulation in clinic - 150 feet with 2.5 # AW donned using  bariatric RW - cues for erect posture and to take a long step. Patient fatigued upon completion.      PATIENT EDUCATION: Education details: Pt educated throughout session about proper posture and technique with exercises. Improved exercise technique, movement at target joints, use of target muscles after min to mod verbal, visual, tactile cues.  Person educated: Patient Education method: Explanation, Demonstration, Tactile cues, and Verbal cues Education comprehension: verbalized understanding, returned demonstration, and verbal cues required   [place] updates on this date, pt to continue as previously given  11/19/21:  Pt instructed on locations that may carry ankle weights for exercises to be performed at home.  Instructed to continue with daily walking.    GOALS: Goals reviewed with patient? No  SHORT TERM GOALS: Target date: 11/19/2021  Pt will be independent with HEP in order to improve strength and balance in order to decrease fall risk and improve function at home. Baseline: 8/28: pt reports doing HEP every day; 01/02/2022= Patient reports performing seated LE strengthening and some standing/walking at home. Goal status: GOAL MET   LONG TERM GOALS: Target date: 03/27/2022  Pt will increase FOTO to at least 57 in order to demonstrate improvement in function related to mobility and  QoL. Baseline: 7/3: 50; 8/28: deferred to next session; 01/02/2022= 47% Goal status: ONGOING  2.  Pt (< 22 yrs old) will complete 5xSTS test < 20 seconds indicating an increase in LE strength and improved balance. Baseline: 7/3: 5mn:01seconds, 8/28: 40.5 seconds; 01/02/2022= difficulty with standing today- patient not feeling well and will retest next visit Goal status: ONGOING  3.  Pt will increase 10MWT to > 0.5 m/s (30 sec) as to improve gait speed for better home ambulation and reduce fall risk. Baseline: 7/3: 0.11 m/s with bRW; 8/30: 0.14 m/s with RW and WC follow; 01/02/2022= 0.14 m/s with Bariatric RW Goal status: ONGOING  4.  Pt will increase BLE gross strength to 4+/5 as to improve functional strenth for independent gait, increased standing tolerance, and increased ADL ability. Baseline: 7/3: grossly 4-/5 (see strength section); 8/28: 4+/5  Goal status: MET  5.  Pt will ambulate 250' with LRAD and supervision with no rest breaks to allow for improved mobility within home and increased independence. Baseline: 7/3: 60.3'; 8/28: 97 ft; 01/02/2022= 75 feet with bariatric RW, CGA with close w/c follow- stopped today due to left hand pain and not feeling well.  Goal status: ONGOING  ASSESSMENT:  CLINICAL IMPRESSION: Patient presented with good motivation for today's session. He presented with good follow through with instruction in postural strengthening exhibiting some fatigue. Later- patient experienced one of the best days of ambulation in a long time in PT. He was able to complete a full lap and patient was excited stating- "It just felt easier today despite ambulating with resistance."   The pt will benefit from further skilled PT to improve strength, gait, balance and mobility.      OBJECTIVE IMPAIRMENTS Abnormal gait, decreased activity tolerance, decreased balance, decreased coordination, decreased endurance, decreased mobility, difficulty walking, decreased ROM, decreased  strength, postural dysfunction, and obesity.   ACTIVITY LIMITATIONS carrying, lifting, bending, sitting, standing, squatting, stairs, transfers, bed mobility, dressing, and locomotion level  PARTICIPATION LIMITATIONS: cleaning, laundry, interpersonal relationship, driving, and community activity  PERSONAL FACTORS Past/current experiences, Time since onset of injury/illness/exacerbation, and 3+ comorbidities: hx of brain tumor, past CVA, HTN, obesity, arthritis  are also affecting patient's functional outcome.   REHAB POTENTIAL: Good  CLINICAL DECISION MAKING: Stable/uncomplicated  EVALUATION COMPLEXITY: Low  PLAN: PT FREQUENCY: 2x/week  PT DURATION: 12 weeks  PLANNED INTERVENTIONS: Therapeutic exercises, Therapeutic activity, Neuromuscular re-education, Balance training, Gait training, Patient/Family education, Joint mobilization, Stair training, Vestibular training, Orthotic/Fit training, DME instructions, Dry Needling, Electrical stimulation, Wheelchair mobility training, Cryotherapy, Moist heat, Compression bandaging, Manual therapy, and Re-evaluation  PLAN FOR NEXT SESSION: strengthening, gait, endurance training, continue plan    Lewis Moccasin, PT 01/09/2022, 4:59 PM

## 2022-01-14 ENCOUNTER — Ambulatory Visit: Payer: Medicare Other

## 2022-01-14 DIAGNOSIS — R2681 Unsteadiness on feet: Secondary | ICD-10-CM

## 2022-01-14 DIAGNOSIS — M6281 Muscle weakness (generalized): Secondary | ICD-10-CM

## 2022-01-14 DIAGNOSIS — R278 Other lack of coordination: Secondary | ICD-10-CM

## 2022-01-14 DIAGNOSIS — R262 Difficulty in walking, not elsewhere classified: Secondary | ICD-10-CM

## 2022-01-14 NOTE — Therapy (Signed)
OUTPATIENT PHYSICAL THERAPY NEURO TREATMENT NOTE  Patient Name: Curtis Powell MRN: [id number] DOB:01/31/1966, 56 y.o., male Today's Date: 01/14/2022   PCP: Valera Castle, MD REFERRING PROVIDER: Valera Castle, MD   PT End of Session - 01/14/22 1259     Visit Number 88    Number of Visits 109    Date for PT Re-Evaluation 03/27/22    Authorization Type UHC Medicare; [name] Medicaid    Authorization Time Period 10/08/21-12/31/21; Recert 8/47/2072-18/28/8337    Progress Note Due on Visit 90    PT Start Time 1301    PT Stop Time 1344    PT Time Calculation (min) 43 min    Equipment Utilized During Treatment Gait belt    Activity Tolerance Patient tolerated treatment well;No increased pain    Behavior During Therapy WFL for tasks assessed/performed                       Past Medical History:  Diagnosis Date   Asthma    GERD (gastroesophageal reflux disease)    Hyperlipidemia    Hypertension    Obesity    Stroke [facility])    Past Surgical History:  Procedure Laterality Date   BRAIN SURGERY     Patient Active Problem List   Diagnosis Date Noted   Impaired glucose tolerance 03/23/2020   Learning disability 03/23/2020   Urinary and fecal incontinence 06/14/2019   Chronic pain syndrome 03/23/2019   Constipation 03/23/2019   Dysarthria 03/23/2019   Dysphagia 03/23/2019   Learning difficulty 03/23/2019   Morbid obesity ([place]) 03/23/2019   Muscle weakness 03/23/2019   Vitamin D deficiency 03/23/2019   Bilateral carotid artery stenosis 12/15/2018   Moderate aortic valve stenosis 12/15/2018   BMI 45.0-49.9, adult ([place]) 11/10/2018   Cerebrovascular accident ([place]) 05/02/2016   Hemiparesis affecting right side as late effect of cerebrovascular accident ([place]) 05/01/2016   Brain tumor ([place]) 11/24/2014   Gastro-esophageal reflux disease without esophagitis 11/24/2014   Accident due to mechanical fall without injury 08/03/2014   Essential hypertension  06/13/2014   Mixed hyperlipidemia 06/13/2014   Unspecified sequelae of cerebral infarction 06/13/2014   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/2014   Type 2 diabetes mellitus without complication ([place]) 44/51/4604   Neck mass 09/08/2013   Swimmer's ear 09/08/2013   Erectile dysfunction 01/03/2013   Prediabetes 01/03/2013    ONSET DATE: 05/09/2016  REFERRING DIAG: CVA (cerebral vascular accident) ([place])  THERAPY DIAG:  Muscle weakness (generalized)  Difficulty in walking, not elsewhere classified  Other lack of coordination  Unsteadiness on feet  Rationale for Evaluation and Treatment Rehabilitation  SUBJECTIVE:  SUBJECTIVE STATEMENT Pt reports some L hand pain (reports carpal tunnel pain, chronic) today, wearing glove. Pt reports no falls/stumbles.  Reports no other concerns.   Pt accompanied by: family member  PERTINENT HISTORY: History of CVA Feb 2018. He was previously fully independent, but this stroke caused significant deficits, mainly on the right side. He reports difficulty walking and transfers, uses a WC for most  His mom had to move in with him and now is his main caregiver. Patient had outpatient PT from November 2018-March 2022. Patient reports since being discharged from outpatient therapy he has kept up with his HEP daily. He does report desire for walking better and for getting in and out of bed easier. He reports his mom is helping with some ADLs. He reports needing help with rolling in bed. He reports his mom/aide assist with dressing. He has a home aide 3 days out of the week." PMH significant for hx of brain tumor, past CVA, and HTN. See chart for additional information.  PAIN:  Are you having pain? No  PRECAUTIONS: None  WEIGHT BEARING RESTRICTIONS No  PLOF: Independent  PATIENT GOALS: "to  walk and sit-to-stand"   OBJECTIVE:   DIAGNOSTIC FINDINGS: None recent    TODAY'S TREATMENT:  TherEx   Scap row with GTB 1x10, 1x20. Rates easy GTB shoulder ER/horiz abd 2x15. Rates medium Chair push up 1x10, 1x5 reps (goal to clear bottom off chair). Reports no pain and rates intervention as medium.  Abdominal crunch forward into lumbar ext x 15 reps   Ambulation in clinic - x approx 148 feet using  bariatric RW, CGA and WC follow - continued cues for erect posture and to take a long step. Patient fatigued upon completion.    Seated with 4 # weights donned each LE: LAQ 2x15 March 1x20. Rates hard      PATIENT EDUCATION: Education details: Pt educated throughout session about proper posture and technique with exercises. Improved exercise technique, movement at target joints, use of target muscles after min to mod verbal, visual, tactile cues.  Person educated: Patient Education method: Explanation, Demonstration, Tactile cues, and Verbal cues Education comprehension: verbalized understanding, returned demonstration, and verbal cues required   [place] updates on this date, pt to continue as previously given  11/19/21:  Pt instructed on locations that may carry ankle weights for exercises to be performed at home.  Instructed to continue with daily walking.    GOALS: Goals reviewed with patient? No  SHORT TERM GOALS: Target date: 11/19/2021  Pt will be independent with HEP in order to improve strength and balance in order to decrease fall risk and improve function at home. Baseline: 8/28: pt reports doing HEP every day; 01/02/2022= Patient reports performing seated LE strengthening and some standing/walking at home. Goal status: GOAL MET   LONG TERM GOALS: Target date: 03/27/2022  Pt will increase FOTO to at least 57 in order to demonstrate improvement in function related to mobility and QoL. Baseline: 7/3: 50; 8/28: deferred to next session; 01/02/2022=  47% Goal status: ONGOING  2.  Pt (< 67 yrs old) will complete 5xSTS test < 20 seconds indicating an increase in LE strength and improved balance. Baseline: 7/3: 55mn:01seconds, 8/28: 40.5 seconds; 01/02/2022= difficulty with standing today- patient not feeling well and will retest next visit Goal status: ONGOING  3.  Pt will increase 10MWT to > 0.5 m/s (30 sec) as to improve gait speed for better home ambulation and reduce fall risk. Baseline: 7/3:  0.11 m/s with bRW; 8/30: 0.14 m/s with RW and WC follow; 01/02/2022= 0.14 m/s with Bariatric RW Goal status: ONGOING   4.  Pt will increase BLE gross strength to 4+/5 as to improve functional strenth for independent gait, increased standing tolerance, and increased ADL ability. Baseline: 7/3: grossly 4-/5 (see strength section); 8/28: 4+/5  Goal status: MET  5.  Pt will ambulate 250' with LRAD and supervision with no rest breaks to allow for improved mobility within home and increased independence. Baseline: 7/3: 60.3'; 8/28: 97 ft; 01/02/2022= 75 feet with bariatric RW, CGA with close w/c follow- stopped today due to left hand pain and not feeling well.  Goal status: ONGOING  ASSESSMENT:  CLINICAL IMPRESSION: Continued plan as laid out in previous sessions with addressing UE strength/postural deficits that are affecting pt's mobility and ability to maintain upright posture with gait and complete transfers. He generally rates interventions as easy-medium, where pt continues to ambulate for increased distances (although not as far as previous session). The pt will benefit from further skilled PT to improve strength, gait, balance and mobility.      OBJECTIVE IMPAIRMENTS Abnormal gait, decreased activity tolerance, decreased balance, decreased coordination, decreased endurance, decreased mobility, difficulty walking, decreased ROM, decreased strength, postural dysfunction, and obesity.   ACTIVITY LIMITATIONS carrying, lifting, bending, sitting,  standing, squatting, stairs, transfers, bed mobility, dressing, and locomotion level  PARTICIPATION LIMITATIONS: cleaning, laundry, interpersonal relationship, driving, and community activity  PERSONAL FACTORS Past/current experiences, Time since onset of injury/illness/exacerbation, and 3+ comorbidities: hx of brain tumor, past CVA, HTN, obesity, arthritis  are also affecting patient's functional outcome.   REHAB POTENTIAL: Good  CLINICAL DECISION MAKING: Stable/uncomplicated  EVALUATION COMPLEXITY: Low  PLAN: PT FREQUENCY: 2x/week  PT DURATION: 12 weeks  PLANNED INTERVENTIONS: Therapeutic exercises, Therapeutic activity, Neuromuscular re-education, Balance training, Gait training, Patient/Family education, Joint mobilization, Stair training, Vestibular training, Orthotic/Fit training, DME instructions, Dry Needling, Electrical stimulation, Wheelchair mobility training, Cryotherapy, Moist heat, Compression bandaging, Manual therapy, and Re-evaluation  PLAN FOR NEXT SESSION: strengthening, gait, endurance training, continue plan    Zollie Pee, PT 01/14/2022, 1:52 PM

## 2022-01-16 ENCOUNTER — Ambulatory Visit: Payer: Medicare Other

## 2022-01-16 DIAGNOSIS — R278 Other lack of coordination: Secondary | ICD-10-CM

## 2022-01-16 DIAGNOSIS — R2689 Other abnormalities of gait and mobility: Secondary | ICD-10-CM

## 2022-01-16 DIAGNOSIS — R296 Repeated falls: Secondary | ICD-10-CM

## 2022-01-16 DIAGNOSIS — M6281 Muscle weakness (generalized): Secondary | ICD-10-CM

## 2022-01-16 DIAGNOSIS — I69351 Hemiplegia and hemiparesis following cerebral infarction affecting right dominant side: Secondary | ICD-10-CM

## 2022-01-16 DIAGNOSIS — R262 Difficulty in walking, not elsewhere classified: Secondary | ICD-10-CM

## 2022-01-16 DIAGNOSIS — R269 Unspecified abnormalities of gait and mobility: Secondary | ICD-10-CM

## 2022-01-16 DIAGNOSIS — R2681 Unsteadiness on feet: Secondary | ICD-10-CM

## 2022-01-16 DIAGNOSIS — R531 Weakness: Secondary | ICD-10-CM

## 2022-01-16 NOTE — Therapy (Signed)
OUTPATIENT PHYSICAL THERAPY NEURO TREATMENT NOTE  Patient Name: Curtis Powell MRN: [id number] DOB:06-21-1965, 56 y.o., male Today's Date: 01/16/2022   PCP: Valera Castle, MD REFERRING PROVIDER: Valera Castle, MD   PT End of Session - 01/16/22 1306     Visit Number 23    Number of Visits 109    Date for PT Re-Evaluation 03/27/22    Authorization Type [employer]HC Medicare; [place] Medicaid    Authorization Time Period 10/08/21-12/31/21; Recert 0/99/8338-25/08/3974    Progress Note Due on Visit 90    PT Start Time 1300    PT Stop Time 1344    PT Time Calculation (min) 44 min    Equipment Utilized During Treatment Gait belt    Activity Tolerance Patient tolerated treatment well;No increased pain    Behavior During Therapy WFL for tasks assessed/performed                        Past Medical History:  Diagnosis Date   Asthma    GERD (gastroesophageal reflux disease)    Hyperlipidemia    Hypertension    Obesity    Stroke [facility])    Past Surgical History:  Procedure Laterality Date   BRAIN SURGERY     Patient Active Problem List   Diagnosis Date Noted   Impaired glucose tolerance 03/23/2020   Learning disability 03/23/2020   Urinary and fecal incontinence 06/14/2019   Chronic pain syndrome 03/23/2019   Constipation 03/23/2019   Dysarthria 03/23/2019   Dysphagia 03/23/2019   Learning difficulty 03/23/2019   Morbid obesity ([place]) 03/23/2019   Muscle weakness 03/23/2019   Vitamin D deficiency 03/23/2019   Bilateral carotid artery stenosis 12/15/2018   Moderate aortic valve stenosis 12/15/2018   BMI 45.0-49.9, adult ([place]) 11/10/2018   Cerebrovascular accident ([place]) 05/02/2016   Hemiparesis affecting right side as late effect of cerebrovascular accident ([place]) 05/01/2016   Brain tumor ([place]) 11/24/2014   Gastro-esophageal reflux disease without esophagitis 11/24/2014   Accident due to mechanical fall without injury 08/03/2014   Essential hypertension  06/13/2014   Mixed hyperlipidemia 06/13/2014   Unspecified sequelae of cerebral infarction 06/13/2014   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/2014   Type 2 diabetes mellitus without complication ([place]) 73/41/9379   Neck mass 09/08/2013   Swimmer's ear 09/08/2013   Erectile dysfunction 01/03/2013   Prediabetes 01/03/2013    ONSET DATE: 05/09/2016  REFERRING DIAG: CVA (cerebral vascular accident) ([place])  THERAPY DIAG:  Muscle weakness (generalized)  Difficulty in walking, not elsewhere classified  Other lack of coordination  Unsteadiness on feet  Abnormality of gait and mobility  Repeated falls  Other abnormalities of gait and mobility  Left-sided weakness  Hemiplegia and hemiparesis following cerebral infarction affecting right dominant side ([facility])  Rationale for Evaluation and Treatment Rehabilitation  SUBJECTIVE:  SUBJECTIVE STATEMENT  Patient reports doing well- States walking more at home.   Pt accompanied by: family member  PERTINENT HISTORY: History of CVA Feb 2018. He was previously fully independent, but this stroke caused significant deficits, mainly on the right side. He reports difficulty walking and transfers, uses a WC for most  His mom had to move in with him and now is his main caregiver. Patient had outpatient PT from November 2018-March 2022. Patient reports since being discharged from outpatient therapy he has kept up with his HEP daily. He does report desire for walking better and for getting in and out of bed easier. He reports his mom is helping with some ADLs. He reports needing help with rolling in bed. He reports his mom/aide assist with dressing. He has a home aide 3 days out of the week." PMH significant for hx of brain tumor, past CVA, and HTN. See chart for additional  information.  PAIN:  Are you having pain? No  PRECAUTIONS: None  WEIGHT BEARING RESTRICTIONS No  PLOF: Independent  PATIENT GOALS: "to walk and sit-to-stand"   OBJECTIVE:   DIAGNOSTIC FINDINGS: None recent    TODAY'S TREATMENT:  TherEx   Scap row (Seated) with GTB 2 sets of 12 reps (VC for correct form) Shoulder ext (Seated) with GTB 2 sets of 12 reps (VC to keep elbows straight)  Chair push up 2x10,  VC to clear bottom from chair- Patient able to complete - rated as hard today.  Ambulation in clinic - x approx 160 feet using  bariatric RW, CGA and WC follow - continued cues for erect posture and to take a long step. Patient required 2 standing rest breaks- Able to complete in 9:17 sec - fatigued upon completion.   Seated with 4 # weights donned each LE: LAQ 2x15 (VC to extend knee as high as possible) March 2x12 reps. (Minimal height with right LE)  Seated hip flex/abd/add up and over hedgehog x 12 reps x 2 sets (mod difficulty clearing with right LE- requiring VC to concentrate and slow down)     PATIENT EDUCATION: Education details: Pt educated throughout session about proper posture and technique with exercises. Improved exercise technique, movement at target joints, use of target muscles after min to mod verbal, visual, tactile cues.  Person educated: Patient Education method: Explanation, Demonstration, Tactile cues, and Verbal cues Education comprehension: verbalized understanding, returned demonstration, and verbal cues required   [place] updates on this date, pt to continue as previously given  11/19/21:  Pt instructed on locations that may carry ankle weights for exercises to be performed at home.  Instructed to continue with daily walking.    GOALS: Goals reviewed with patient? No  SHORT TERM GOALS: Target date: 11/19/2021  Pt will be independent with HEP in order to improve strength and balance in order to decrease fall risk and improve  function at home. Baseline: 8/28: pt reports doing HEP every day; 01/02/2022= Patient reports performing seated LE strengthening and some standing/walking at home. Goal status: GOAL MET   LONG TERM GOALS: Target date: 03/27/2022  Pt will increase FOTO to at least 57 in order to demonstrate improvement in function related to mobility and QoL. Baseline: 7/3: 50; 8/28: deferred to next session; 01/02/2022= 47% Goal status: ONGOING  2.  Pt (< 57 yrs old) will complete 5xSTS test < 20 seconds indicating an increase in LE strength and improved balance. Baseline: 7/3: 26mn:01seconds, 8/28: 40.5 seconds; 01/02/2022= difficulty with standing today-  patient not feeling well and will retest next visit Goal status: ONGOING  3.  Pt will increase 10MWT to > 0.5 m/s (30 sec) as to improve gait speed for better home ambulation and reduce fall risk. Baseline: 7/3: 0.11 m/s with bRW; 8/30: 0.14 m/s with RW and WC follow; 01/02/2022= 0.14 m/s with Bariatric RW Goal status: ONGOING   4.  Pt will increase BLE gross strength to 4+/5 as to improve functional strenth for independent gait, increased standing tolerance, and increased ADL ability. Baseline: 7/3: grossly 4-/5 (see strength section); 8/28: 4+/5  Goal status: MET  5.  Pt will ambulate 250' with LRAD and supervision with no rest breaks to allow for improved mobility within home and increased independence. Baseline: 7/3: 60.3'; 8/28: 97 ft; 01/02/2022= 75 feet with bariatric RW, CGA with close w/c follow- stopped today due to left hand pain and not feeling well.  Goal status: ONGOING  ASSESSMENT:  CLINICAL IMPRESSION: Patient presents with good motivation for today's session. He was able to complete postural and LE strengthening well with mostly only verbal cues. He continues to present with very limited right LE hip flex/knee ext. He was able to walk slightly further and cited fatigue as only limiting factor today. The pt will benefit from further skilled  PT to improve strength, gait, balance and mobility.      OBJECTIVE IMPAIRMENTS Abnormal gait, decreased activity tolerance, decreased balance, decreased coordination, decreased endurance, decreased mobility, difficulty walking, decreased ROM, decreased strength, postural dysfunction, and obesity.   ACTIVITY LIMITATIONS carrying, lifting, bending, sitting, standing, squatting, stairs, transfers, bed mobility, dressing, and locomotion level  PARTICIPATION LIMITATIONS: cleaning, laundry, interpersonal relationship, driving, and community activity  PERSONAL FACTORS Past/current experiences, Time since onset of injury/illness/exacerbation, and 3+ comorbidities: hx of brain tumor, past CVA, HTN, obesity, arthritis  are also affecting patient's functional outcome.   REHAB POTENTIAL: Good  CLINICAL DECISION MAKING: Stable/uncomplicated  EVALUATION COMPLEXITY: Low  PLAN: PT FREQUENCY: 2x/week  PT DURATION: 12 weeks  PLANNED INTERVENTIONS: Therapeutic exercises, Therapeutic activity, Neuromuscular re-education, Balance training, Gait training, Patient/Family education, Joint mobilization, Stair training, Vestibular training, Orthotic/Fit training, DME instructions, Dry Needling, Electrical stimulation, Wheelchair mobility training, Cryotherapy, Moist heat, Compression bandaging, Manual therapy, and Re-evaluation  PLAN FOR NEXT SESSION: strengthening, gait, endurance training, continue plan    Lewis Moccasin, PT 01/16/2022, 4:30 PM

## 2022-01-21 ENCOUNTER — Ambulatory Visit: Payer: Medicare Other

## 2022-01-21 DIAGNOSIS — R278 Other lack of coordination: Secondary | ICD-10-CM

## 2022-01-21 DIAGNOSIS — R2681 Unsteadiness on feet: Secondary | ICD-10-CM

## 2022-01-21 DIAGNOSIS — R2689 Other abnormalities of gait and mobility: Secondary | ICD-10-CM

## 2022-01-21 DIAGNOSIS — M6281 Muscle weakness (generalized): Secondary | ICD-10-CM

## 2022-01-21 NOTE — Therapy (Signed)
OUTPATIENT PHYSICAL THERAPY NEURO TREATMENT NOTE/Physical Therapy Progress Note   Dates of reporting period  12/03/2021   to   01/21/2022   Patient Name: LARENCE THONE MRN: [id number] DOB:1965/06/28, 56 y.o., male Today's Date: 01/21/2022   PCP: Valera Castle, MD REFERRING PROVIDER: Valera Castle, MD   PT End of Session - 01/21/22 1254     Visit Number 80    Number of Visits 109    Date for PT Re-Evaluation 03/27/22    Authorization Type UHC Medicare; [place] Medicaid    Authorization Time Period 10/08/21-12/31/21; Recert 12/05/5619-30/86/5784    Progress Note Due on Visit 90    PT Start Time 1258    PT Stop Time 1341    PT Time Calculation (min) 43 min    Equipment Utilized During Treatment Gait belt    Activity Tolerance Patient tolerated treatment well;No increased pain    Behavior During Therapy WFL for tasks assessed/performed                        Past Medical History:  Diagnosis Date   Asthma    GERD (gastroesophageal reflux disease)    Hyperlipidemia    Hypertension    Obesity    Stroke [facility])    Past Surgical History:  Procedure Laterality Date   BRAIN SURGERY     Patient Active Problem List   Diagnosis Date Noted   Impaired glucose tolerance 03/23/2020   Learning disability 03/23/2020   Urinary and fecal incontinence 06/14/2019   Chronic pain syndrome 03/23/2019   Constipation 03/23/2019   Dysarthria 03/23/2019   Dysphagia 03/23/2019   Learning difficulty 03/23/2019   Morbid obesity ([place]) 03/23/2019   Muscle weakness 03/23/2019   Vitamin D deficiency 03/23/2019   Bilateral carotid artery stenosis 12/15/2018   Moderate aortic valve stenosis 12/15/2018   BMI 45.0-49.9, adult ([place]) 11/10/2018   Cerebrovascular accident ([place]) 05/02/2016   Hemiparesis affecting right side as late effect of cerebrovascular accident ([place]) 05/01/2016   Brain tumor ([place]) 11/24/2014   Gastro-esophageal reflux disease without esophagitis 11/24/2014    Accident due to mechanical fall without injury 08/03/2014   Essential hypertension 06/13/2014   Mixed hyperlipidemia 06/13/2014   Unspecified sequelae of cerebral infarction 06/13/2014   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/2014   Type 2 diabetes mellitus without complication ([place]) 69/62/9528   Neck mass 09/08/2013   Swimmer's ear 09/08/2013   Erectile dysfunction 01/03/2013   Prediabetes 01/03/2013    ONSET DATE: 05/09/2016  REFERRING DIAG: CVA (cerebral vascular accident) ([place])  THERAPY DIAG:  Muscle weakness (generalized)  Other abnormalities of gait and mobility  Unsteadiness on feet  Other lack of coordination  Rationale for Evaluation and Treatment Rehabilitation  SUBJECTIVE:  SUBJECTIVE STATEMENT Pt reports HEP is going OK at home. He reports no stumbles/falls. Still having L wrist/hand pain, wearing his brace.  Pt accompanied by: family member  PERTINENT HISTORY: History of CVA Feb 2018. He was previously fully independent, but this stroke caused significant deficits, mainly on the right side. He reports difficulty walking and transfers, uses a WC for most  His mom had to move in with him and now is his main caregiver. Patient had outpatient PT from November 2018-March 2022. Patient reports since being discharged from outpatient therapy he has kept up with his HEP daily. He does report desire for walking better and for getting in and out of bed easier. He reports his mom is helping with some ADLs. He reports needing help with rolling in bed. He reports his mom/aide assist with dressing. He has a home aide 3 days out of the week." PMH significant for hx of brain tumor, past CVA, and HTN. See chart for additional information.  PAIN:  Are you having pain? No  PRECAUTIONS: None  WEIGHT BEARING  RESTRICTIONS No  PLOF: Independent  PATIENT GOALS: "to walk and sit-to-stand"   OBJECTIVE:   DIAGNOSTIC FINDINGS: None recent    TODAY'S TREATMENT:  TherEx   5xSTS - 31 sec (improved from previous assessment)   Seated with 5 # weights donned each LE: LAQ 20x each LE  Seated side step-up and off of 2" step 2x15 each LE   Scap row with GTB 1x20.  GTB shoulder ER/horiz abd 20x.  Chair push up 2x10. Able to clear seat today. Continues to rate medium.   Seated thoracic ext exercise with lifting 2000 gr ball 10x  Hamstring curl walks in WC x 10 meters  Seated marches with 5# weights donned 10x each LE    NMR- Standing at RW reaching to stack cones on table with UUE support, then stacking and unstacking without UE support. Rates medium.  Standing throwing balls at velcro dart board, UUE support at RW x several minutes   PATIENT EDUCATION: Education details: Pt educated throughout session about proper posture and technique with exercises. Improved exercise technique, movement at target joints, use of target muscles after min to mod verbal, visual, tactile cues.  Goals Person educated: Patient Education method: Explanation, Demonstration, Tactile cues, and Verbal cues Education comprehension: verbalized understanding, returned demonstration, and verbal cues required   [place] updates on this date, pt to continue as previously given  11/19/21:  Pt instructed on locations that may carry ankle weights for exercises to be performed at home.  Instructed to continue with daily walking.    GOALS: Goals reviewed with patient? No  SHORT TERM GOALS: Target date: 11/19/2021  Pt will be independent with HEP in order to improve strength and balance in order to decrease fall risk and improve function at home. Baseline: 8/28: pt reports doing HEP every day; 01/02/2022= Patient reports performing seated LE strengthening and some standing/walking at home. Goal status:  GOAL MET   LONG TERM GOALS: Target date: 03/27/2022  Pt will increase FOTO to at least 57 in order to demonstrate improvement in function related to mobility and QoL. Baseline: 7/3: 50; 8/28: deferred to next session; 01/02/2022= 47% Goal status: ONGOING  2.  Pt (< 4 yrs old) will complete 5xSTS test < 20 seconds indicating an increase in LE strength and improved balance. Baseline: 7/3: 49mn:01seconds, 8/28: 40.5 seconds; 01/02/2022= difficulty with standing today- patient not feeling well and will retest next visit; 10/16:  31 seconds Goal status: ONGOING  3.  Pt will increase 10MWT to > 0.5 m/s (30 sec) as to improve gait speed for better home ambulation and reduce fall risk. Baseline: 7/3: 0.11 m/s with bRW; 8/30: 0.14 m/s with RW and WC follow; 01/02/2022= 0.14 m/s with Bariatric RW Goal status: ONGOING   4.  Pt will increase BLE gross strength to 4+/5 as to improve functional strenth for independent gait, increased standing tolerance, and increased ADL ability. Baseline: 7/3: grossly 4-/5 (see strength section); 8/28: 4+/5  Goal status: MET  5.  Pt will ambulate 250' with LRAD and supervision with no rest breaks to allow for improved mobility within home and increased independence. Baseline: 7/3: 60.3'; 8/28: 97 ft; 01/02/2022= 75 feet with bariatric RW, CGA with close w/c follow- stopped today due to left hand pain and not feeling well.  Goal status: ONGOING  ASSESSMENT:  CLINICAL IMPRESSION: 5xSTS retested today for progress note, pt completing in 31 seconds, an improvement from previous assessment. This indicates improved BLE strength and a modest decrease in fall risk. Please refer to note from 01/02/2022 for further goal reassessment, as these were recently completed. While pt making gains, standing balance with dynamic tasks such as reaching, remain very difficult for pt. The pt will benefit from further skilled PT to improve strength, gait, balance and  mobility.      OBJECTIVE IMPAIRMENTS Abnormal gait, decreased activity tolerance, decreased balance, decreased coordination, decreased endurance, decreased mobility, difficulty walking, decreased ROM, decreased strength, postural dysfunction, and obesity.   ACTIVITY LIMITATIONS carrying, lifting, bending, sitting, standing, squatting, stairs, transfers, bed mobility, dressing, and locomotion level  PARTICIPATION LIMITATIONS: cleaning, laundry, interpersonal relationship, driving, and community activity  PERSONAL FACTORS Past/current experiences, Time since onset of injury/illness/exacerbation, and 3+ comorbidities: hx of brain tumor, past CVA, HTN, obesity, arthritis  are also affecting patient's functional outcome.   REHAB POTENTIAL: Good  CLINICAL DECISION MAKING: Stable/uncomplicated  EVALUATION COMPLEXITY: Low  PLAN: PT FREQUENCY: 2x/week  PT DURATION: 12 weeks  PLANNED INTERVENTIONS: Therapeutic exercises, Therapeutic activity, Neuromuscular re-education, Balance training, Gait training, Patient/Family education, Joint mobilization, Stair training, Vestibular training, Orthotic/Fit training, DME instructions, Dry Needling, Electrical stimulation, Wheelchair mobility training, Cryotherapy, Moist heat, Compression bandaging, Manual therapy, and Re-evaluation  PLAN FOR NEXT SESSION: strengthening, gait, endurance training, continue plan    Zollie Pee, PT 01/21/2022, 5:12 PM

## 2022-01-23 ENCOUNTER — Ambulatory Visit: Payer: Medicare Other

## 2022-01-23 DIAGNOSIS — R2681 Unsteadiness on feet: Secondary | ICD-10-CM

## 2022-01-23 DIAGNOSIS — M6281 Muscle weakness (generalized): Secondary | ICD-10-CM

## 2022-01-23 DIAGNOSIS — R278 Other lack of coordination: Secondary | ICD-10-CM

## 2022-01-23 DIAGNOSIS — R262 Difficulty in walking, not elsewhere classified: Secondary | ICD-10-CM

## 2022-01-23 NOTE — Therapy (Signed)
OUTPATIENT PHYSICAL THERAPY NEURO TREATMENT NOTE   Patient Name: Curtis Powell MRN: [id number] DOB:Nov 14, 1965, 56 y.o., male Today's Date: 01/23/2022   PCP: Valera Castle, MD REFERRING PROVIDER: Valera Castle, MD   PT End of Session - 01/23/22 1258     Visit Number 91    Number of Visits 109    Date for PT Re-Evaluation 03/27/22    Authorization Type UHC Medicare; [place] Medicaid    Authorization Time Period 10/08/21-12/31/21; Recert 4/36/0677-03/40/3524    Progress Note Due on Visit 90    PT Start Time 1302    PT Stop Time 1344    PT Time Calculation (min) 42 min    Equipment Utilized During Treatment Gait belt    Activity Tolerance Patient tolerated treatment well;No increased pain    Behavior During Therapy WFL for tasks assessed/performed                         Past Medical History:  Diagnosis Date   Asthma    GERD (gastroesophageal reflux disease)    Hyperlipidemia    Hypertension    Obesity    Stroke [facility])    Past Surgical History:  Procedure Laterality Date   BRAIN SURGERY     Patient Active Problem List   Diagnosis Date Noted   Impaired glucose tolerance 03/23/2020   Learning disability 03/23/2020   Urinary and fecal incontinence 06/14/2019   Chronic pain syndrome 03/23/2019   Constipation 03/23/2019   Dysarthria 03/23/2019   Dysphagia 03/23/2019   Learning difficulty 03/23/2019   Morbid obesity ([place]) 03/23/2019   Muscle weakness 03/23/2019   Vitamin D deficiency 03/23/2019   Bilateral carotid artery stenosis 12/15/2018   Moderate aortic valve stenosis 12/15/2018   BMI 45.0-49.9, adult ([place]) 11/10/2018   Cerebrovascular accident ([place]) 05/02/2016   Hemiparesis affecting right side as late effect of cerebrovascular accident ([place]) 05/01/2016   Brain tumor ([place]) 11/24/2014   Gastro-esophageal reflux disease without esophagitis 11/24/2014   Accident due to mechanical fall without injury 08/03/2014   Essential hypertension  06/13/2014   Mixed hyperlipidemia 06/13/2014   Unspecified sequelae of cerebral infarction 06/13/2014   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/2014   Type 2 diabetes mellitus without complication ([place]) 81/85/9093   Neck mass 09/08/2013   Swimmer's ear 09/08/2013   Erectile dysfunction 01/03/2013   Prediabetes 01/03/2013    ONSET DATE: 05/09/2016  REFERRING DIAG: CVA (cerebral vascular accident) ([place])  THERAPY DIAG:  Muscle weakness (generalized)  Difficulty in walking, not elsewhere classified  Other lack of coordination  Unsteadiness on feet  Rationale for Evaluation and Treatment Rehabilitation  SUBJECTIVE:  SUBJECTIVE STATEMENT Pt reports no pain, no stumbles/falls. He has been doing his HEP. He practiced STS at home and felt OK about his balance.  Pt accompanied by: family member  PERTINENT HISTORY: History of CVA Feb 2018. He was previously fully independent, but this stroke caused significant deficits, mainly on the right side. He reports difficulty walking and transfers, uses a WC for most  His mom had to move in with him and now is his main caregiver. Patient had outpatient PT from November 2018-March 2022. Patient reports since being discharged from outpatient therapy he has kept up with his HEP daily. He does report desire for walking better and for getting in and out of bed easier. He reports his mom is helping with some ADLs. He reports needing help with rolling in bed. He reports his mom/aide assist with dressing. He has a home aide 3 days out of the week." PMH significant for hx of brain tumor, past CVA, and HTN. See chart for additional information.  PAIN:  Are you having pain? No  PRECAUTIONS: None  WEIGHT BEARING RESTRICTIONS No  PLOF: Independent  PATIENT GOALS: "to walk and  sit-to-stand"   OBJECTIVE:   DIAGNOSTIC FINDINGS: None recent    TODAY'S TREATMENT:  TherEx   Amb. for endurance and improved functional capacity with RW and WC follow x 174 ft   Seated with 5 # weights donned each LE: LAQ 2x20 each LE  Seated side step-up on and off of 2" step 15x each LE. Rates hard. Exhibits improved ROM compared to previous session.  Chair push up 2x10.   Pt takes bathroom break x 6 min - unbilled   Seated adductor squeezes with pball 15x 3 sec holds BLE   PATIENT EDUCATION: Education details: Pt educated throughout session about proper posture and technique with exercises. Improved exercise technique, movement at target joints, use of target muscles after min to mod verbal, visual, tactile cues.  Goals Person educated: Patient Education method: Explanation, Demonstration, Tactile cues, and Verbal cues Education comprehension: verbalized understanding, returned demonstration, and verbal cues required   [place] updates on this date, pt to continue as previously given  11/19/21:  Pt instructed on locations that may carry ankle weights for exercises to be performed at home.  Instructed to continue with daily walking.    GOALS: Goals reviewed with patient? No  SHORT TERM GOALS: Target date: 11/19/2021  Pt will be independent with HEP in order to improve strength and balance in order to decrease fall risk and improve function at home. Baseline: 8/28: pt reports doing HEP every day; 01/02/2022= Patient reports performing seated LE strengthening and some standing/walking at home. Goal status: GOAL MET   LONG TERM GOALS: Target date: 03/27/2022  Pt will increase FOTO to at least 57 in order to demonstrate improvement in function related to mobility and QoL. Baseline: 7/3: 50; 8/28: deferred to next session; 01/02/2022= 47% Goal status: ONGOING  2.  Pt (< 35 yrs old) will complete 5xSTS test < 20 seconds indicating an increase in LE  strength and improved balance. Baseline: 7/3: 69mn:01seconds, 8/28: 40.5 seconds; 01/02/2022= difficulty with standing today- patient not feeling well and will retest next visit; 10/16: 31 seconds Goal status: ONGOING  3.  Pt will increase 10MWT to > 0.5 m/s (30 sec) as to improve gait speed for better home ambulation and reduce fall risk. Baseline: 7/3: 0.11 m/s with bRW; 8/30: 0.14 m/s with RW and WC follow; 01/02/2022= 0.14 m/s with Bariatric  RW Goal status: ONGOING   4.  Pt will increase BLE gross strength to 4+/5 as to improve functional strenth for independent gait, increased standing tolerance, and increased ADL ability. Baseline: 7/3: grossly 4-/5 (see strength section); 8/28: 4+/5  Goal status: MET  5.  Pt will ambulate 250' with LRAD and supervision with no rest breaks to allow for improved mobility within home and increased independence. Baseline: 7/3: 60.3'; 8/28: 97 ft; 01/02/2022= 75 feet with bariatric RW, CGA with close w/c follow- stopped today due to left hand pain and not feeling well.  Goal status: ONGOING  ASSESSMENT:  CLINICAL IMPRESSION: Pt ambulates 172 ft today with RW and WC, indicating improved LE muscular endurance and increased functional capacity. Pt still struggles, however, with balance when first transferring from seated to standing position, and required up to min a at one point to correct for LOB. He was cued to increase hip ext. upon standing and this modestly improved his balance (pt difficulty with timing coordination of movement), will require further instruction. The pt will benefit from further skilled PT to improve strength, gait, balance and mobility to increase QOL and decrease fall risk.   OBJECTIVE IMPAIRMENTS Abnormal gait, decreased activity tolerance, decreased balance, decreased coordination, decreased endurance, decreased mobility, difficulty walking, decreased ROM, decreased strength, postural dysfunction, and obesity.   ACTIVITY LIMITATIONS  carrying, lifting, bending, sitting, standing, squatting, stairs, transfers, bed mobility, dressing, and locomotion level  PARTICIPATION LIMITATIONS: cleaning, laundry, interpersonal relationship, driving, and community activity  PERSONAL FACTORS Past/current experiences, Time since onset of injury/illness/exacerbation, and 3+ comorbidities: hx of brain tumor, past CVA, HTN, obesity, arthritis  are also affecting patient's functional outcome.   REHAB POTENTIAL: Good  CLINICAL DECISION MAKING: Stable/uncomplicated  EVALUATION COMPLEXITY: Low  PLAN: PT FREQUENCY: 2x/week  PT DURATION: 12 weeks  PLANNED INTERVENTIONS: Therapeutic exercises, Therapeutic activity, Neuromuscular re-education, Balance training, Gait training, Patient/Family education, Joint mobilization, Stair training, Vestibular training, Orthotic/Fit training, DME instructions, Dry Needling, Electrical stimulation, Wheelchair mobility training, Cryotherapy, Moist heat, Compression bandaging, Manual therapy, and Re-evaluation  PLAN FOR NEXT SESSION: strengthening, gait, endurance training, continue plan    Zollie Pee, PT 01/23/2022, 3:53 PM

## 2022-01-28 ENCOUNTER — Ambulatory Visit: Payer: Medicare Other

## 2022-01-28 DIAGNOSIS — R2681 Unsteadiness on feet: Secondary | ICD-10-CM | POA: Diagnosis not present

## 2022-01-28 DIAGNOSIS — R262 Difficulty in walking, not elsewhere classified: Secondary | ICD-10-CM

## 2022-01-28 DIAGNOSIS — R278 Other lack of coordination: Secondary | ICD-10-CM

## 2022-01-28 DIAGNOSIS — R269 Unspecified abnormalities of gait and mobility: Secondary | ICD-10-CM

## 2022-01-28 DIAGNOSIS — M6281 Muscle weakness (generalized): Secondary | ICD-10-CM

## 2022-01-28 DIAGNOSIS — R2689 Other abnormalities of gait and mobility: Secondary | ICD-10-CM

## 2022-01-28 NOTE — Therapy (Signed)
OUTPATIENT PHYSICAL THERAPY NEURO TREATMENT NOTE   Patient Name: Curtis Powell MRN: [id number] DOB:03-Mar-1966, 56 y.o., male Today's Date: 01/28/2022   PCP: Valera Castle, MD REFERRING PROVIDER: Valera Castle, MD   PT End of Session - 01/28/22 1303     Visit Number 91    Number of Visits 109    Date for PT Re-Evaluation 03/27/22    Authorization Type UHC Medicare;  Medicaid    Authorization Time Period 10/08/21-12/31/21; Recert 1/77/9390-30/12/2328    Progress Note Due on Visit 90    PT Start Time 1302    PT Stop Time 1345    PT Time Calculation (min) 43 min    Equipment Utilized During Treatment Gait belt    Activity Tolerance Patient tolerated treatment well;No increased pain    Behavior During Therapy WFL for tasks assessed/performed              Past Medical History:  Diagnosis Date   Asthma    GERD (gastroesophageal reflux disease)    Hyperlipidemia    Hypertension    Obesity    Stroke [facility])    Past Surgical History:  Procedure Laterality Date   BRAIN SURGERY     Patient Active Problem List   Diagnosis Date Noted   Impaired glucose tolerance 03/23/2020   Learning disability 03/23/2020   Urinary and fecal incontinence 06/14/2019   Chronic pain syndrome 03/23/2019   Constipation 03/23/2019   Dysarthria 03/23/2019   Dysphagia 03/23/2019   Learning difficulty 03/23/2019   Morbid obesity ([place]) 03/23/2019   Muscle weakness 03/23/2019   Vitamin D deficiency 03/23/2019   Bilateral carotid artery stenosis 12/15/2018   Moderate aortic valve stenosis 12/15/2018   BMI 45.0-49.9, adult ([place]) 11/10/2018   Cerebrovascular accident ([place]) 05/02/2016   Hemiparesis affecting right side as late effect of cerebrovascular accident ([place]) 05/01/2016   Brain tumor ([place]) 11/24/2014   Gastro-esophageal reflux disease without esophagitis 11/24/2014   Accident due to mechanical fall without injury 08/03/2014   Essential hypertension 06/13/2014   Mixed  hyperlipidemia 06/13/2014   Unspecified sequelae of cerebral infarction 06/13/2014   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/2014   Type 2 diabetes mellitus without complication ([place]) 07/62/2633   Neck mass 09/08/2013   Swimmer's ear 09/08/2013   Erectile dysfunction 01/03/2013   Prediabetes 01/03/2013    ONSET DATE: 05/09/2016  REFERRING DIAG: CVA (cerebral vascular accident) ([place])  THERAPY DIAG:  Muscle weakness (generalized)  Difficulty in walking, not elsewhere classified  Other lack of coordination  Unsteadiness on feet  Other abnormalities of gait and mobility  Abnormality of gait and mobility  Rationale for Evaluation and Treatment Rehabilitation  SUBJECTIVE:  SUBJECTIVE STATEMENT Pt reports feeling fatigued today. No reason in particular. Otherwise no new concerns denying falls or balance issues.   Pt accompanied by: family member  PERTINENT HISTORY: History of CVA Feb 2018. He was previously fully independent, but this stroke caused significant deficits, mainly on the right side. He reports difficulty walking and transfers, uses a WC for most  His mom had to move in with him and now is his main caregiver. Patient had outpatient PT from November 2018-March 2022. Patient reports since being discharged from outpatient therapy he has kept up with his HEP daily. He does report desire for walking better and for getting in and out of bed easier. He reports his mom is helping with some ADLs. He reports needing help with rolling in bed. He reports his mom/aide assist with dressing. He has a home aide 3 days out of the week." PMH significant for hx of brain tumor, past CVA, and HTN. See chart for additional information.  PAIN:  Are you having pain? No  PRECAUTIONS: None  WEIGHT BEARING RESTRICTIONS No  PLOF:  Independent  PATIENT GOALS: "to walk and sit-to-stand"   OBJECTIVE:   DIAGNOSTIC FINDINGS: None recent    TODAY'S TREATMENT:  There.ex:   Gait 68' with BRW and W/c follow. SBA to CGA.   Seated with 5 # weights donned each LE: LAQ 2x20 each LE  Seated side step-up on and off of 2" step 15x each LE. TC's on knees elicits increased hip flexion AROM on top of step.   STS from w/c: 2x5 at BRW. CGA. Min to mod VC's for upright posture.   Seated GTB scap retractions: x25     PATIENT EDUCATION: Education details: Pt educated throughout session about proper posture and technique with exercises. Improved exercise technique, movement at target joints, use of target muscles after min to mod verbal, visual, tactile cues.  Goals Person educated: Patient Education method: Explanation, Demonstration, Tactile cues, and Verbal cues Education comprehension: verbalized understanding, returned demonstration, and verbal cues required   [place] updates on this date, pt to continue as previously given  11/19/21:  Pt instructed on locations that may carry ankle weights for exercises to be performed at home.  Instructed to continue with daily walking.    GOALS: Goals reviewed with patient? No  SHORT TERM GOALS: Target date: 11/19/2021  Pt will be independent with HEP in order to improve strength and balance in order to decrease fall risk and improve function at home. Baseline: 8/28: pt reports doing HEP every day; 01/02/2022= Patient reports performing seated LE strengthening and some standing/walking at home. Goal status: GOAL MET   LONG TERM GOALS: Target date: 03/27/2022  Pt will increase FOTO to at least 57 in order to demonstrate improvement in function related to mobility and QoL. Baseline: 7/3: 50; 8/28: deferred to next session; 01/02/2022= 47% Goal status: ONGOING  2.  Pt (< 50 yrs old) will complete 5xSTS test < 20 seconds indicating an increase in LE strength  and improved balance. Baseline: 7/3: 69mn:01seconds, 8/28: 40.5 seconds; 01/02/2022= difficulty with standing today- patient not feeling well and will retest next visit; 10/16: 31 seconds Goal status: ONGOING  3.  Pt will increase 10MWT to > 0.5 m/s (30 sec) as to improve gait speed for better home ambulation and reduce fall risk. Baseline: 7/3: 0.11 m/s with bRW; 8/30: 0.14 m/s with RW and WC follow; 01/02/2022= 0.14 m/s with Bariatric RW Goal status: ONGOING   4.  Pt  will increase BLE gross strength to 4+/5 as to improve functional strenth for independent gait, increased standing tolerance, and increased ADL ability. Baseline: 7/3: grossly 4-/5 (see strength section); 8/28: 4+/5  Goal status: MET  5.  Pt will ambulate 250' with LRAD and supervision with no rest breaks to allow for improved mobility within home and increased independence. Baseline: 7/3: 60.3'; 8/28: 97 ft; 01/02/2022= 75 feet with bariatric RW, CGA with close w/c follow- stopped today due to left hand pain and not feeling well.  Goal status: ONGOING  ASSESSMENT:  CLINICAL IMPRESSION: Continuing PT POC with focus on functional strengthening. Pt unable to ambulate as far this date due to increased general fatigue. Notable decreased step lengths, hip/knee flexion/ankle DF during swing phase leading to hip hike to clear LE's. Focus on seated, resisted hip flexion to assist in improved foot clearance in gait. Also addressing frequently on upright posture and periscapular strengthening to assist in upright posture and balance for LE's to reduce UE support need on BRW. Pt will continue to benefit from skilled PT surfaces to address deficits in balance, gait, and strength to decrease falls risk.  OBJECTIVE IMPAIRMENTS Abnormal gait, decreased activity tolerance, decreased balance, decreased coordination, decreased endurance, decreased mobility, difficulty walking, decreased ROM, decreased strength, postural dysfunction, and obesity.    ACTIVITY LIMITATIONS carrying, lifting, bending, sitting, standing, squatting, stairs, transfers, bed mobility, dressing, and locomotion level  PARTICIPATION LIMITATIONS: cleaning, laundry, interpersonal relationship, driving, and community activity  PERSONAL FACTORS Past/current experiences, Time since onset of injury/illness/exacerbation, and 3+ comorbidities: hx of brain tumor, past CVA, HTN, obesity, arthritis  are also affecting patient's functional outcome.   REHAB POTENTIAL: Good  CLINICAL DECISION MAKING: Stable/uncomplicated  EVALUATION COMPLEXITY: Low  PLAN: PT FREQUENCY: 2x/week  PT DURATION: 12 weeks  PLANNED INTERVENTIONS: Therapeutic exercises, Therapeutic activity, Neuromuscular re-education, Balance training, Gait training, Patient/Family education, Joint mobilization, Stair training, Vestibular training, Orthotic/Fit training, DME instructions, Dry Needling, Electrical stimulation, Wheelchair mobility training, Cryotherapy, Moist heat, Compression bandaging, Manual therapy, and Re-evaluation  PLAN FOR NEXT SESSION: strengthening, gait, endurance training, continue plan   Salem Caster. Fairly IV, PT, DPT Physical Therapist- [facility]  01/28/2022, 2:39 PM

## 2022-01-30 ENCOUNTER — Ambulatory Visit: Payer: Medicare Other

## 2022-01-30 DIAGNOSIS — R262 Difficulty in walking, not elsewhere classified: Secondary | ICD-10-CM

## 2022-01-30 DIAGNOSIS — R2681 Unsteadiness on feet: Secondary | ICD-10-CM

## 2022-01-30 DIAGNOSIS — M6281 Muscle weakness (generalized): Secondary | ICD-10-CM

## 2022-01-30 DIAGNOSIS — R278 Other lack of coordination: Secondary | ICD-10-CM

## 2022-01-30 NOTE — Therapy (Signed)
OUTPATIENT PHYSICAL THERAPY NEURO TREATMENT NOTE   Patient Name: Curtis Powell MRN: [id number] DOB:1965-05-19, 56 y.o., male Today's Date: 01/30/2022   PCP: Valera Castle, MD REFERRING PROVIDER: Valera Castle, MD   PT End of Session - 01/30/22 1303     Visit Number 92    Number of Visits 109    Date for PT Re-Evaluation 03/27/22    Authorization Type UHC Medicare; [place] Medicaid    Authorization Time Period 10/08/21-12/31/21; Recert 9/47/1252-71/29/2909    Progress Note Due on Visit 90    PT Start Time 1304    PT Stop Time 1345    PT Time Calculation (min) 41 min    Equipment Utilized During Treatment Gait belt    Activity Tolerance Patient tolerated treatment well;No increased pain    Behavior During Therapy WFL for tasks assessed/performed               Past Medical History:  Diagnosis Date   Asthma    GERD (gastroesophageal reflux disease)    Hyperlipidemia    Hypertension    Obesity    Stroke [place])    Past Surgical History:  Procedure Laterality Date   BRAIN SURGERY     Patient Active Problem List   Diagnosis Date Noted   Impaired glucose tolerance 03/23/2020   Learning disability 03/23/2020   Urinary and fecal incontinence 06/14/2019   Chronic pain syndrome 03/23/2019   Constipation 03/23/2019   Dysarthria 03/23/2019   Dysphagia 03/23/2019   Learning difficulty 03/23/2019   Morbid obesity ([place]) 03/23/2019   Muscle weakness 03/23/2019   Vitamin D deficiency 03/23/2019   Bilateral carotid artery stenosis 12/15/2018   Moderate aortic valve stenosis 12/15/2018   BMI 45.0-49.9, adult ([place]) 11/10/2018   Cerebrovascular accident ([place]) 05/02/2016   Hemiparesis affecting right side as late effect of cerebrovascular accident ([place]) 05/01/2016   Brain tumor ([place]) 11/24/2014   Gastro-esophageal reflux disease without esophagitis 11/24/2014   Accident due to mechanical fall without injury 08/03/2014   Essential hypertension 06/13/2014   Mixed  hyperlipidemia 06/13/2014   Unspecified sequelae of cerebral infarction 06/13/2014   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/2014   Type 2 diabetes mellitus without complication ([place]) 06/07/4994   Neck mass 09/08/2013   Swimmer's ear 09/08/2013   Erectile dysfunction 01/03/2013   Prediabetes 01/03/2013    ONSET DATE: 05/09/2016  REFERRING DIAG: CVA (cerebral vascular accident) ([place])  THERAPY DIAG:  Difficulty in walking, not elsewhere classified  Unsteadiness on feet  Muscle weakness (generalized)  Other lack of coordination  Rationale for Evaluation and Treatment Rehabilitation  SUBJECTIVE:  SUBJECTIVE STATEMENT Pt doing OK. He reports feeling improved energy since last appointment. He reports no stumbles/falls, no current concerns or other updates.   Pt accompanied by: family member  PERTINENT HISTORY: History of CVA Feb 2018. He was previously fully independent, but this stroke caused significant deficits, mainly on the right side. He reports difficulty walking and transfers, uses a WC for most  His mom had to move in with him and now is his main caregiver. Patient had outpatient PT from November 2018-March 2022. Patient reports since being discharged from outpatient therapy he has kept up with his HEP daily. He does report desire for walking better and for getting in and out of bed easier. He reports his mom is helping with some ADLs. He reports needing help with rolling in bed. He reports his mom/aide assist with dressing. He has a home aide 3 days out of the week." PMH significant for hx of brain tumor, past CVA, and HTN. See chart for additional information.  PAIN:  Are you having pain? No  PRECAUTIONS: None  WEIGHT BEARING RESTRICTIONS No  PLOF: Independent  PATIENT GOALS: "to walk and  sit-to-stand"   OBJECTIVE:   DIAGNOSTIC FINDINGS: None recent    TODAY'S TREATMENT: Gait belt donned and CGA provided throughout unless specified otherwise.  There.ex:   Ambulation for LE mm and cardiorespiratory endurance 167 ft with BRW and W/c follow. CGA  Seated march 3x12 each LE   LAQ 20x each LE with 3 sec hold  NRM:  Standing balance at RW 30 sec intervals x 2 sets. Attempted 3rd set, however, too fatigued to complete STS. After performing seated marches (below) pt then completed 3rd set of 60 sec unsupported standing.  Standing no UE support with horizontal and vertical head turns 10x for each  PATIENT EDUCATION: Education details: Pt educated throughout session about proper posture and technique with exercises. Improved exercise technique, movement at target joints, use of target muscles after min to mod verbal, visual, tactile cues.   Person educated: Patient Education method: Explanation, Demonstration, Tactile cues, and Verbal cues Education comprehension: verbalized understanding, returned demonstration, and verbal cues required   [place] updates on this date, pt to continue as previously given  11/19/21:  Pt instructed on locations that may carry ankle weights for exercises to be performed at home.  Instructed to continue with daily walking.    GOALS: Goals reviewed with patient? No  SHORT TERM GOALS: Target date: 11/19/2021  Pt will be independent with HEP in order to improve strength and balance in order to decrease fall risk and improve function at home. Baseline: 8/28: pt reports doing HEP every day; 01/02/2022= Patient reports performing seated LE strengthening and some standing/walking at home. Goal status: GOAL MET   LONG TERM GOALS: Target date: 03/27/2022  Pt will increase FOTO to at least 57 in order to demonstrate improvement in function related to mobility and QoL. Baseline: 7/3: 50; 8/28: deferred to next session; 01/02/2022=  47% Goal status: ONGOING  2.  Pt (< 29 yrs old) will complete 5xSTS test < 20 seconds indicating an increase in LE strength and improved balance. Baseline: 7/3: 71mn:01seconds, 8/28: 40.5 seconds; 01/02/2022= difficulty with standing today- patient not feeling well and will retest next visit; 10/16: 31 seconds Goal status: ONGOING  3.  Pt will increase 10MWT to > 0.5 m/s (30 sec) as to improve gait speed for better home ambulation and reduce fall risk. Baseline: 7/3: 0.11 m/s with bRW; 8/30: 0.14  m/s with RW and WC follow; 01/02/2022= 0.14 m/s with Bariatric RW Goal status: ONGOING   4.  Pt will increase BLE gross strength to 4+/5 as to improve functional strenth for independent gait, increased standing tolerance, and increased ADL ability. Baseline: 7/3: grossly 4-/5 (see strength section); 8/28: 4+/5  Goal status: MET  5.  Pt will ambulate 250' with LRAD and supervision with no rest breaks to allow for improved mobility within home and increased independence. Baseline: 7/3: 60.3'; 8/28: 97 ft; 01/02/2022= 75 feet with bariatric RW, CGA with close w/c follow- stopped today due to left hand pain and not feeling well.  Goal status: ONGOING  ASSESSMENT:  CLINICAL IMPRESSION: Pt tolerates session well without pain. He is highly motivated to participate and continues to ambulate for greater distances than in previous sessions. Pt also able to perform multiple 30-60 sec bouts of unsupported standing. However, this remains one of his most challenging activities. Pt will continue to benefit from skilled PT surfaces to address deficits in balance, gait, and strength to decrease falls risk.  OBJECTIVE IMPAIRMENTS Abnormal gait, decreased activity tolerance, decreased balance, decreased coordination, decreased endurance, decreased mobility, difficulty walking, decreased ROM, decreased strength, postural dysfunction, and obesity.   ACTIVITY LIMITATIONS carrying, lifting, bending, sitting, standing,  squatting, stairs, transfers, bed mobility, dressing, and locomotion level  PARTICIPATION LIMITATIONS: cleaning, laundry, interpersonal relationship, driving, and community activity  PERSONAL FACTORS Past/current experiences, Time since onset of injury/illness/exacerbation, and 3+ comorbidities: hx of brain tumor, past CVA, HTN, obesity, arthritis  are also affecting patient's functional outcome.   REHAB POTENTIAL: Good  CLINICAL DECISION MAKING: Stable/uncomplicated  EVALUATION COMPLEXITY: Low  PLAN: PT FREQUENCY: 2x/week  PT DURATION: 12 weeks  PLANNED INTERVENTIONS: Therapeutic exercises, Therapeutic activity, Neuromuscular re-education, Balance training, Gait training, Patient/Family education, Joint mobilization, Stair training, Vestibular training, Orthotic/Fit training, DME instructions, Dry Needling, Electrical stimulation, Wheelchair mobility training, Cryotherapy, Moist heat, Compression bandaging, Manual therapy, and Re-evaluation  PLAN FOR NEXT SESSION: strengthening, gait, endurance training, continue plan   [place] Caster. Fairly IV, PT, DPT Physical Therapist- [facility]  01/30/2022, 1:54 PM

## 2022-02-04 ENCOUNTER — Ambulatory Visit: Payer: Medicare Other

## 2022-02-04 DIAGNOSIS — R262 Difficulty in walking, not elsewhere classified: Secondary | ICD-10-CM

## 2022-02-04 DIAGNOSIS — M6281 Muscle weakness (generalized): Secondary | ICD-10-CM

## 2022-02-04 DIAGNOSIS — R2681 Unsteadiness on feet: Secondary | ICD-10-CM | POA: Diagnosis not present

## 2022-02-04 DIAGNOSIS — R278 Other lack of coordination: Secondary | ICD-10-CM

## 2022-02-04 NOTE — Therapy (Signed)
OUTPATIENT PHYSICAL THERAPY NEURO TREATMENT NOTE   Patient Name: Curtis Powell MRN: [id number] DOB:1965/11/30, 56 y.o., male Today's Date: 02/04/2022   PCP: Valera Castle, MD REFERRING PROVIDER: Valera Castle, MD   PT End of Session - 02/04/22 1350     Visit Number 93    Number of Visits 109    Date for PT Re-Evaluation 03/27/22    Authorization Type UHC Medicare;  Medicaid    Authorization Time Period 10/08/21-12/31/21; Recert 3/00/7622-63/33/5456    Progress Note Due on Visit 90    PT Start Time 1302    PT Stop Time 1345    PT Time Calculation (min) 43 min    Equipment Utilized During Treatment Gait belt    Activity Tolerance Patient tolerated treatment well;No increased pain    Behavior During Therapy WFL for tasks assessed/performed                Past Medical History:  Diagnosis Date   Asthma    GERD (gastroesophageal reflux disease)    Hyperlipidemia    Hypertension    Obesity    Stroke [facility])    Past Surgical History:  Procedure Laterality Date   BRAIN SURGERY     Patient Active Problem List   Diagnosis Date Noted   Impaired glucose tolerance 03/23/2020   Learning disability 03/23/2020   Urinary and fecal incontinence 06/14/2019   Chronic pain syndrome 03/23/2019   Constipation 03/23/2019   Dysarthria 03/23/2019   Dysphagia 03/23/2019   Learning difficulty 03/23/2019   Morbid obesity ([place]) 03/23/2019   Muscle weakness 03/23/2019   Vitamin D deficiency 03/23/2019   Bilateral carotid artery stenosis 12/15/2018   Moderate aortic valve stenosis 12/15/2018   BMI 45.0-49.9, adult ([place]) 11/10/2018   Cerebrovascular accident ([place]) 05/02/2016   Hemiparesis affecting right side as late effect of cerebrovascular accident ([place]) 05/01/2016   Brain tumor ([place]) 11/24/2014   Gastro-esophageal reflux disease without esophagitis 11/24/2014   Accident due to mechanical fall without injury 08/03/2014   Essential hypertension 06/13/2014   Mixed  hyperlipidemia 06/13/2014   Unspecified sequelae of cerebral infarction 06/13/2014   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/2014   Type 2 diabetes mellitus without complication ([place]) 25/63/8937   Neck mass 09/08/2013   Swimmer's ear 09/08/2013   Erectile dysfunction 01/03/2013   Prediabetes 01/03/2013    ONSET DATE: 05/09/2016  REFERRING DIAG: CVA (cerebral vascular accident) ([place])  THERAPY DIAG:  Unsteadiness on feet  Muscle weakness (generalized)  Other lack of coordination  Difficulty in walking, not elsewhere classified  Rationale for Evaluation and Treatment Rehabilitation  SUBJECTIVE:  SUBJECTIVE STATEMENT Pt reports he fell Saturday night while standing at his walker.  He reports he hit his head and required help getting back up. He used his life alert and an ambulance came per pt report and helped him off the floor. He reports he did not seek further care after hitting his head, he did not lose consciousness. He denies any headaches, he did report experiencing spinning that lasted for seconds after hitting head, and says EMT made aware. He reports he slept after that and that the spinning has stopped. He reports no other symptoms.  Pt accompanied by: family member  PERTINENT HISTORY: History of CVA Feb 2018. He was previously fully independent, but this stroke caused significant deficits, mainly on the right side. He reports difficulty walking and transfers, uses a WC for most  His mom had to move in with him and now is his main caregiver. Patient had outpatient PT from November 2018-March 2022. Patient reports since being discharged from outpatient therapy he has kept up with his HEP daily. He does report desire for walking better and for getting in and out of bed easier. He reports his mom is helping with some  ADLs. He reports needing help with rolling in bed. He reports his mom/aide assist with dressing. He has a home aide 3 days out of the week." PMH significant for hx of brain tumor, past CVA, and HTN. See chart for additional information.  PAIN:  Are you having pain? No  PRECAUTIONS: None  WEIGHT BEARING RESTRICTIONS No  PLOF: Independent  PATIENT GOALS: "to walk and sit-to-stand"   OBJECTIVE:   DIAGNOSTIC FINDINGS: None recent    TODAY'S TREATMENT: Gait belt donned and CGA provided throughout unless specified otherwise.   Pt noted to have small scab on L side of top of head. Skin otherwise WNL.  There.ex:   Chair/tricep dips 2x10  Ambulation for LE mm and cardiorespiratory endurance 160 ft with 2# weights donned each LE and BRW and W/c follow. CGA throughout   LAQ 15x each LE with 2# AW each LE  WC hamstring walk 18 ft  Matrix cable machine rows 12.5# 3x10 BUE  Matrix cable machine shoulder ER 2.5# 10x each UE    PATIENT EDUCATION: Education details: Pt educated throughout session about proper posture and technique with exercises. Improved exercise technique, movement at target joints, use of target muscles after min to mod verbal, visual, tactile cues.   Person educated: Patient Education method: Explanation, Demonstration, Tactile cues, and Verbal cues Education comprehension: verbalized understanding, returned demonstration, and verbal cues required   [place] updates on this date, pt to continue as previously given  11/19/21:  Pt instructed on locations that may carry ankle weights for exercises to be performed at home.  Instructed to continue with daily walking.    GOALS: Goals reviewed with patient? No  SHORT TERM GOALS: Target date: 11/19/2021  Pt will be independent with HEP in order to improve strength and balance in order to decrease fall risk and improve function at home. Baseline: 8/28: pt reports doing HEP every day; 01/02/2022=  Patient reports performing seated LE strengthening and some standing/walking at home. Goal status: GOAL MET   LONG TERM GOALS: Target date: 03/27/2022  Pt will increase FOTO to at least 57 in order to demonstrate improvement in function related to mobility and QoL. Baseline: 7/3: 50; 8/28: deferred to next session; 01/02/2022= 47% Goal status: ONGOING  2.  Pt (< 4 yrs old) will complete  5xSTS test < 20 seconds indicating an increase in LE strength and improved balance. Baseline: 7/3: 39mn:01seconds, 8/28: 40.5 seconds; 01/02/2022= difficulty with standing today- patient not feeling well and will retest next visit; 10/16: 31 seconds Goal status: ONGOING  3.  Pt will increase 10MWT to > 0.5 m/s (30 sec) as to improve gait speed for better home ambulation and reduce fall risk. Baseline: 7/3: 0.11 m/s with bRW; 8/30: 0.14 m/s with RW and WC follow; 01/02/2022= 0.14 m/s with Bariatric RW Goal status: ONGOING   4.  Pt will increase BLE gross strength to 4+/5 as to improve functional strenth for independent gait, increased standing tolerance, and increased ADL ability. Baseline: 7/3: grossly 4-/5 (see strength section); 8/28: 4+/5  Goal status: MET  5.  Pt will ambulate 250' with LRAD and supervision with no rest breaks to allow for improved mobility within home and increased independence. Baseline: 7/3: 60.3'; 8/28: 97 ft; 01/02/2022= 75 feet with bariatric RW, CGA with close w/c follow- stopped today due to left hand pain and not feeling well.  Goal status: ONGOING  ASSESSMENT:  CLINICAL IMPRESSION: Pt able to advance endurance training by completing 160 ft with 2# weights donned each LE. This indicates improved LE strength and endurance. While pt shows progress, he did require prolonged seated rest following this activity due to fatigue. Will continue to address endurance impairment. Pt will continue to benefit from skilled PT surfaces to address deficits in balance, gait, endurance and  strength to decrease falls risk.  OBJECTIVE IMPAIRMENTS Abnormal gait, decreased activity tolerance, decreased balance, decreased coordination, decreased endurance, decreased mobility, difficulty walking, decreased ROM, decreased strength, postural dysfunction, and obesity.   ACTIVITY LIMITATIONS carrying, lifting, bending, sitting, standing, squatting, stairs, transfers, bed mobility, dressing, and locomotion level  PARTICIPATION LIMITATIONS: cleaning, laundry, interpersonal relationship, driving, and community activity  PERSONAL FACTORS Past/current experiences, Time since onset of injury/illness/exacerbation, and 3+ comorbidities: hx of brain tumor, past CVA, HTN, obesity, arthritis  are also affecting patient's functional outcome.   REHAB POTENTIAL: Good  CLINICAL DECISION MAKING: Stable/uncomplicated  EVALUATION COMPLEXITY: Low  PLAN: PT FREQUENCY: 2x/week  PT DURATION: 12 weeks  PLANNED INTERVENTIONS: Therapeutic exercises, Therapeutic activity, Neuromuscular re-education, Balance training, Gait training, Patient/Family education, Joint mobilization, Stair training, Vestibular training, Orthotic/Fit training, DME instructions, Dry Needling, Electrical stimulation, Wheelchair mobility training, Cryotherapy, Moist heat, Compression bandaging, Manual therapy, and Re-evaluation  PLAN FOR NEXT SESSION: strengthening, gait, endurance training, continue plan   HRicard DillonPT, DPT  Physical Therapist- [facility] 02/04/2022, 1:54 PM

## 2022-02-06 ENCOUNTER — Encounter: Payer: Self-pay | Admitting: Physical Therapy

## 2022-02-06 ENCOUNTER — Ambulatory Visit: Payer: Medicare Other | Attending: Family Medicine | Admitting: Physical Therapy

## 2022-02-06 DIAGNOSIS — R269 Unspecified abnormalities of gait and mobility: Secondary | ICD-10-CM | POA: Diagnosis present

## 2022-02-06 DIAGNOSIS — R296 Repeated falls: Secondary | ICD-10-CM | POA: Insufficient documentation

## 2022-02-06 DIAGNOSIS — M6281 Muscle weakness (generalized): Secondary | ICD-10-CM | POA: Diagnosis present

## 2022-02-06 DIAGNOSIS — I69351 Hemiplegia and hemiparesis following cerebral infarction affecting right dominant side: Secondary | ICD-10-CM | POA: Insufficient documentation

## 2022-02-06 DIAGNOSIS — R531 Weakness: Secondary | ICD-10-CM | POA: Insufficient documentation

## 2022-02-06 DIAGNOSIS — R2689 Other abnormalities of gait and mobility: Secondary | ICD-10-CM | POA: Diagnosis present

## 2022-02-06 DIAGNOSIS — R2681 Unsteadiness on feet: Secondary | ICD-10-CM | POA: Diagnosis not present

## 2022-02-06 DIAGNOSIS — R262 Difficulty in walking, not elsewhere classified: Secondary | ICD-10-CM | POA: Diagnosis present

## 2022-02-06 DIAGNOSIS — R278 Other lack of coordination: Secondary | ICD-10-CM | POA: Insufficient documentation

## 2022-02-06 NOTE — Therapy (Signed)
OUTPATIENT PHYSICAL THERAPY NEURO TREATMENT NOTE   Patient Name: Curtis Powell MRN: [id number] DOB:1965/09/12, 56 y.o., male Today's Date: 02/06/2022   PCP: Valera Castle, MD REFERRING PROVIDER: Valera Castle, MD   PT End of Session - 02/06/22 1329     Visit Number 94    Number of Visits 109    Date for PT Re-Evaluation 03/27/22    Authorization Type UHC Medicare; [place] Medicaid    Authorization Time Period 10/08/21-12/31/21; Recert 3/71/6967-89/38/1017    Progress Note Due on Visit 100    PT Start Time 1304    PT Stop Time 1344    PT Time Calculation (min) 40 min    Equipment Utilized During Treatment Gait belt    Activity Tolerance Patient tolerated treatment well;No increased pain    Behavior During Therapy WFL for tasks assessed/performed                 Past Medical History:  Diagnosis Date   Asthma    GERD (gastroesophageal reflux disease)    Hyperlipidemia    Hypertension    Obesity    Stroke [facility])    Past Surgical History:  Procedure Laterality Date   BRAIN SURGERY     Patient Active Problem List   Diagnosis Date Noted   Impaired glucose tolerance 03/23/2020   Learning disability 03/23/2020   Urinary and fecal incontinence 06/14/2019   Chronic pain syndrome 03/23/2019   Constipation 03/23/2019   Dysarthria 03/23/2019   Dysphagia 03/23/2019   Learning difficulty 03/23/2019   Morbid obesity ([place]) 03/23/2019   Muscle weakness 03/23/2019   Vitamin D deficiency 03/23/2019   Bilateral carotid artery stenosis 12/15/2018   Moderate aortic valve stenosis 12/15/2018   BMI 45.0-49.9, adult ([place]) 11/10/2018   Cerebrovascular accident ([place]) 05/02/2016   Hemiparesis affecting right side as late effect of cerebrovascular accident ([place]) 05/01/2016   Brain tumor ([place]) 11/24/2014   Gastro-esophageal reflux disease without esophagitis 11/24/2014   Accident due to mechanical fall without injury 08/03/2014   Essential hypertension 06/13/2014    Mixed hyperlipidemia 06/13/2014   Unspecified sequelae of cerebral infarction 06/13/2014   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/2014   Type 2 diabetes mellitus without complication ([place]) 51/05/5850   Neck mass 09/08/2013   Swimmer's ear 09/08/2013   Erectile dysfunction 01/03/2013   Prediabetes 01/03/2013    ONSET DATE: 05/09/2016  REFERRING DIAG: CVA (cerebral vascular accident) ([place])  THERAPY DIAG:  Unsteadiness on feet  Difficulty in walking, not elsewhere classified  Other abnormalities of gait and mobility  Abnormality of gait and mobility  Rationale for Evaluation and Treatment Rehabilitation  SUBJECTIVE:  SUBJECTIVE STATEMENT Patient reports no significant changes since last session with no new falls or loss of balance noted.  Pt accompanied by: family member  PERTINENT HISTORY: History of CVA Feb 2018. He was previously fully independent, but this stroke caused significant deficits, mainly on the right side. He reports difficulty walking and transfers, uses a WC for most  His mom had to move in with him and now is his main caregiver. Patient had outpatient PT from November 2018-March 2022. Patient reports since being discharged from outpatient therapy he has kept up with his HEP daily. He does report desire for walking better and for getting in and out of bed easier. He reports his mom is helping with some ADLs. He reports needing help with rolling in bed. He reports his mom/aide assist with dressing. He has a home aide 3 days out of the week." PMH significant for hx of brain tumor, past CVA, and HTN. See chart for additional information.  PAIN:  Are you having pain? No  PRECAUTIONS: None  WEIGHT BEARING RESTRICTIONS No  PLOF: Independent  PATIENT GOALS: "to walk and sit-to-stand"   OBJECTIVE:    DIAGNOSTIC FINDINGS: None recent    TODAY'S TREATMENT: Gait belt donned and CGA provided throughout unless specified otherwise.   Pt noted to have small scab on L side of top of head. Skin otherwise WNL.  There.ex:   Ambulation for LE mm and cardiorespiratory endurance with 2# weights donned each LE and BRW and W/c follow. CGA throughout  Round 1: 56 feet  Round 2: 110 feet   WC hamstring walk 24 ft with assistance from PT to get back to treatment area from end of walking bout   LAQ 2*12 each LE with 2# AW each LE  Chair/tricep dips 2x10   STS x 6 reps with RW anterior for UE suppot and with cues for full erect posture each rep, proonged stand with each reap and with last rep proponged 30 sec stand.   Seated rows with GTB x 15; cues for proper form     PATIENT EDUCATION: Education details: Pt educated throughout session about proper posture and technique with exercises. Improved exercise technique, movement at target joints, use of target muscles after min to mod verbal, visual, tactile cues.   Person educated: Patient Education method: Explanation, Demonstration, Tactile cues, and Verbal cues Education comprehension: verbalized understanding, returned demonstration, and verbal cues required   [place] updates on this date, pt to continue as previously given  11/19/21:  Pt instructed on locations that may carry ankle weights for exercises to be performed at home.  Instructed to continue with daily walking.    GOALS: Goals reviewed with patient? No  SHORT TERM GOALS: Target date: 11/19/2021  Pt will be independent with HEP in order to improve strength and balance in order to decrease fall risk and improve function at home. Baseline: 8/28: pt reports doing HEP every day; 01/02/2022= Patient reports performing seated LE strengthening and some standing/walking at home. Goal status: GOAL MET   LONG TERM GOALS: Target date: 03/27/2022  Pt will increase  FOTO to at least 57 in order to demonstrate improvement in function related to mobility and QoL. Baseline: 7/3: 50; 8/28: deferred to next session; 01/02/2022= 47% Goal status: ONGOING  2.  Pt (< 52 yrs old) will complete 5xSTS test < 20 seconds indicating an increase in LE strength and improved balance. Baseline: 7/3: 23mn:01seconds, 8/28: 40.5 seconds; 01/02/2022= difficulty with standing today- patient not  feeling well and will retest next visit; 10/16: 31 seconds Goal status: ONGOING  3.  Pt will increase 10MWT to > 0.5 m/s (30 sec) as to improve gait speed for better home ambulation and reduce fall risk. Baseline: 7/3: 0.11 m/s with bRW; 8/30: 0.14 m/s with RW and WC follow; 01/02/2022= 0.14 m/s with Bariatric RW Goal status: ONGOING   4.  Pt will increase BLE gross strength to 4+/5 as to improve functional strenth for independent gait, increased standing tolerance, and increased ADL ability. Baseline: 7/3: grossly 4-/5 (see strength section); 8/28: 4+/5  Goal status: MET  5.  Pt will ambulate 250' with LRAD and supervision with no rest breaks to allow for improved mobility within home and increased independence. Baseline: 7/3: 60.3'; 8/28: 97 ft; 01/02/2022= 75 feet with bariatric RW, CGA with close w/c follow- stopped today due to left hand pain and not feeling well.  Goal status: ONGOING  ASSESSMENT:  CLINICAL IMPRESSION: Continued with current plan of care as laid out in evaluation and recent prior sessions. Pt remains motivated to advance progress toward goals in order to maximize independence and safety at home. Pt requires high level assistance and cuing for completion of exercises in order to provide adequate level of stimulation challenge while minimizing pain and discomfort when possible. Pt closely monitored throughout session pt response and to maximize patient safety during interventions. Pt continues to demonstrate progress toward goals AEB progression of interventions this date  either in volume or intensity.   OBJECTIVE IMPAIRMENTS Abnormal gait, decreased activity tolerance, decreased balance, decreased coordination, decreased endurance, decreased mobility, difficulty walking, decreased ROM, decreased strength, postural dysfunction, and obesity.   ACTIVITY LIMITATIONS carrying, lifting, bending, sitting, standing, squatting, stairs, transfers, bed mobility, dressing, and locomotion level  PARTICIPATION LIMITATIONS: cleaning, laundry, interpersonal relationship, driving, and community activity  PERSONAL FACTORS Past/current experiences, Time since onset of injury/illness/exacerbation, and 3+ comorbidities: hx of brain tumor, past CVA, HTN, obesity, arthritis  are also affecting patient's functional outcome.   REHAB POTENTIAL: Good  CLINICAL DECISION MAKING: Stable/uncomplicated  EVALUATION COMPLEXITY: Low  PLAN: PT FREQUENCY: 2x/week  PT DURATION: 12 weeks  PLANNED INTERVENTIONS: Therapeutic exercises, Therapeutic activity, Neuromuscular re-education, Balance training, Gait training, Patient/Family education, Joint mobilization, Stair training, Vestibular training, Orthotic/Fit training, DME instructions, Dry Needling, Electrical stimulation, Wheelchair mobility training, Cryotherapy, Moist heat, Compression bandaging, Manual therapy, and Re-evaluation  PLAN FOR NEXT SESSION: strengthening, gait, endurance training, continue plan   Particia Lather PT   Physical Therapist- [facility]  02/06/2022, 2:44 PM

## 2022-02-11 ENCOUNTER — Ambulatory Visit: Payer: Medicare Other

## 2022-02-11 DIAGNOSIS — R2681 Unsteadiness on feet: Secondary | ICD-10-CM

## 2022-02-11 DIAGNOSIS — M6281 Muscle weakness (generalized): Secondary | ICD-10-CM

## 2022-02-11 DIAGNOSIS — R269 Unspecified abnormalities of gait and mobility: Secondary | ICD-10-CM

## 2022-02-11 DIAGNOSIS — R2689 Other abnormalities of gait and mobility: Secondary | ICD-10-CM

## 2022-02-11 DIAGNOSIS — R262 Difficulty in walking, not elsewhere classified: Secondary | ICD-10-CM

## 2022-02-11 NOTE — Therapy (Signed)
OUTPATIENT PHYSICAL THERAPY NEURO TREATMENT NOTE   Patient Name: Curtis Powell MRN: [id number] DOB:1965/09/12, 56 y.o., male Today's Date: 02/06/2022   PCP: Valera Castle, MD REFERRING PROVIDER: Valera Castle, MD   PT End of Session - 02/06/22 1329     Visit Number 94    Number of Visits 109    Date for PT Re-Evaluation 03/27/22    Authorization Type UHC Medicare; [place] Medicaid    Authorization Time Period 10/08/21-12/31/21; Recert 3/71/6967-89/38/1017    Progress Note Due on Visit 100    PT Start Time 1304    PT Stop Time 1344    PT Time Calculation (min) 40 min    Equipment Utilized During Treatment Gait belt    Activity Tolerance Patient tolerated treatment well;No increased pain    Behavior During Therapy WFL for tasks assessed/performed                 Past Medical History:  Diagnosis Date   Asthma    GERD (gastroesophageal reflux disease)    Hyperlipidemia    Hypertension    Obesity    Stroke [facility])    Past Surgical History:  Procedure Laterality Date   BRAIN SURGERY     Patient Active Problem List   Diagnosis Date Noted   Impaired glucose tolerance 03/23/2020   Learning disability 03/23/2020   Urinary and fecal incontinence 06/14/2019   Chronic pain syndrome 03/23/2019   Constipation 03/23/2019   Dysarthria 03/23/2019   Dysphagia 03/23/2019   Learning difficulty 03/23/2019   Morbid obesity ([place]) 03/23/2019   Muscle weakness 03/23/2019   Vitamin D deficiency 03/23/2019   Bilateral carotid artery stenosis 12/15/2018   Moderate aortic valve stenosis 12/15/2018   BMI 45.0-49.9, adult ([place]) 11/10/2018   Cerebrovascular accident ([place]) 05/02/2016   Hemiparesis affecting right side as late effect of cerebrovascular accident ([place]) 05/01/2016   Brain tumor ([place]) 11/24/2014   Gastro-esophageal reflux disease without esophagitis 11/24/2014   Accident due to mechanical fall without injury 08/03/2014   Essential hypertension 06/13/2014    Mixed hyperlipidemia 06/13/2014   Unspecified sequelae of cerebral infarction 06/13/2014   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/2014   Type 2 diabetes mellitus without complication ([place]) 51/05/5850   Neck mass 09/08/2013   Swimmer's ear 09/08/2013   Erectile dysfunction 01/03/2013   Prediabetes 01/03/2013    ONSET DATE: 05/09/2016  REFERRING DIAG: CVA (cerebral vascular accident) ([place])  THERAPY DIAG:  Unsteadiness on feet  Difficulty in walking, not elsewhere classified  Other abnormalities of gait and mobility  Abnormality of gait and mobility  Rationale for Evaluation and Treatment Rehabilitation  SUBJECTIVE:  SUBJECTIVE STATEMENT No updates since prior session.   Pt accompanied by: family member  PERTINENT HISTORY: History of CVA Feb 2018. He was previously fully independent, but this stroke caused significant deficits, mainly on the right side. He reports difficulty walking and transfers, uses a WC for most  His mom had to move in with him and now is his main caregiver. Patient had outpatient PT from November 2018-March 2022. Patient reports since being discharged from outpatient therapy he has kept up with his HEP daily. He does report desire for walking better and for getting in and out of bed easier. He reports his mom is helping with some ADLs. He reports needing help with rolling in bed. He reports his mom/aide assist with dressing. He has a home aide 3 days out of the week." PMH significant for hx of brain tumor, past CVA, and HTN. See chart for additional information.  PAIN:  Are you having pain? No  PRECAUTIONS: None  WEIGHT BEARING RESTRICTIONS No  PLOF: Independent  PATIENT GOALS: "to walk and sit-to-stand"   OBJECTIVE:   DIAGNOSTIC FINDINGS: None recent    TODAY'S TREATMENT: Gait  belt donned and CGA provided throughout unless specified otherwise.   Pt noted to have small scab on L side of top of head. Skin otherwise WNL.  There.ex:  STS from WC 1x10,  seated RDL c blue weighted ball  AMB 77f, 3 Minutes, no weight, AFO adjustment  STS from WC 1x10,  AMB 544f 3 Minutes, 4lb AW bilat,  AMB 5032f3 Minutes, 4lb AW bilat,     PATIENT EDUCATION: Education details: Pt educated throughout session about proper posture and technique with exercises. Improved exercise technique, movement at target joints, use of target muscles after min to mod verbal, visual, tactile cues.   Person educated: Patient Education method: Explanation, Demonstration, Tactile cues, and Verbal cues Education comprehension: verbalized understanding, returned demonstration, and verbal cues required   [place], pt to continue as previously given  11/19/21:  Pt instructed on locations that may carry ankle weights for exercises to be performed at home.  Instructed to continue with daily walking.    GOALS: Goals reviewed with patient? No  SHORT TERM GOALS: Target date: 11/19/2021  Pt will be independent with HEP in order to improve strength and balance in order to decrease fall risk and improve function at home. Baseline: 8/28: pt reports doing HEP every day; 01/02/2022= Patient reports performing seated LE strengthening and some standing/walking at home. Goal status: GOAL MET   LONG TERM GOALS: Target date: 03/27/2022  Pt will increase FOTO to at least 57 in order to demonstrate improvement in function related to mobility and QoL. Baseline: 7/3: 50; 8/28: deferred to next session; 01/02/2022= 47% Goal status: ONGOING  2.  Pt (< 60 44s old) will complete 5xSTS test < 20 seconds indicating an increase in LE strength and improved balance. Baseline: 7/3: 1mi10m1seconds, 8/28: 40.5 seconds; 01/02/2022= difficulty with standing today- patient not feeling well  and will retest next visit; 10/16: 31 seconds Goal status: ONGOING  3.  Pt will increase 10MWT to > 0.5 m/s (30 sec) as to improve gait speed for better home ambulation and reduce fall risk. Baseline: 7/3: 0.11 m/s with bRW; 8/30: 0.14 m/s with RW and WC follow; 01/02/2022= 0.14 m/s with Bariatric RW Goal status: ONGOING   4.  Pt will increase BLE gross strength to 4+/5 as to improve functional strenth for independent gait, increased standing  tolerance, and increased ADL ability. Baseline: 7/3: grossly 4-/5 (see strength section); 8/28: 4+/5  Goal status: MET  5.  Pt will ambulate 250' with LRAD and supervision with no rest breaks to allow for improved mobility within home and increased independence. Baseline: 7/3: 60.3'; 8/28: 97 ft; 01/02/2022= 75 feet with bariatric RW, CGA with close w/c follow- stopped today due to left hand pain and not feeling well.  Goal status: ONGOING  ASSESSMENT:  CLINICAL IMPRESSION: Continued with current plan of care as laid out in evaluation and recent prior sessions. Pt remains motivated to advance progress toward goals in order to maximize independence and safety at home. Increased AW this dat to 4lb which pt reports is not that heavy, likely needs to be heavier to achiver greater AMB intensity. Pt continues to demonstrate progress toward goals AEB progression of interventions this date either in volume or intensity.   OBJECTIVE IMPAIRMENTS Abnormal gait, decreased activity tolerance, decreased balance, decreased coordination, decreased endurance, decreased mobility, difficulty walking, decreased ROM, decreased strength, postural dysfunction, and obesity.   ACTIVITY LIMITATIONS carrying, lifting, bending, sitting, standing, squatting, stairs, transfers, bed mobility, dressing, and locomotion level  PARTICIPATION LIMITATIONS: cleaning, laundry, interpersonal relationship, driving, and community activity  PERSONAL FACTORS Past/current experiences, Time since  onset of injury/illness/exacerbation, and 3+ comorbidities: hx of brain tumor, past CVA, HTN, obesity, arthritis  are also affecting patient's functional outcome.   REHAB POTENTIAL: Good  CLINICAL DECISION MAKING: Stable/uncomplicated  EVALUATION COMPLEXITY: Low  PLAN: PT FREQUENCY: 2x/week  PT DURATION: 12 weeks  PLANNED INTERVENTIONS: Therapeutic exercises, Therapeutic activity, Neuromuscular re-education, Balance training, Gait training, Patient/Family education, Joint mobilization, Stair training, Vestibular training, Orthotic/Fit training, DME instructions, Dry Needling, Electrical stimulation, Wheelchair mobility training, Cryotherapy, Moist heat, Compression bandaging, Manual therapy, and Re-evaluation  PLAN FOR NEXT SESSION: strengthening, gait, endurance training, continue plan  1:13 PM, 02/11/22 Etta Grandchild, PT, DPT Physical Therapist - [place] [phone]      Physical Therapist- [facility]  02/06/2022, 2:44 PM

## 2022-02-13 ENCOUNTER — Ambulatory Visit: Payer: Medicare Other

## 2022-02-13 DIAGNOSIS — I69351 Hemiplegia and hemiparesis following cerebral infarction affecting right dominant side: Secondary | ICD-10-CM

## 2022-02-13 DIAGNOSIS — R2689 Other abnormalities of gait and mobility: Secondary | ICD-10-CM

## 2022-02-13 DIAGNOSIS — M6281 Muscle weakness (generalized): Secondary | ICD-10-CM

## 2022-02-13 DIAGNOSIS — R2681 Unsteadiness on feet: Secondary | ICD-10-CM

## 2022-02-13 DIAGNOSIS — R269 Unspecified abnormalities of gait and mobility: Secondary | ICD-10-CM

## 2022-02-13 DIAGNOSIS — R278 Other lack of coordination: Secondary | ICD-10-CM

## 2022-02-13 DIAGNOSIS — R296 Repeated falls: Secondary | ICD-10-CM

## 2022-02-13 DIAGNOSIS — R531 Weakness: Secondary | ICD-10-CM

## 2022-02-13 DIAGNOSIS — R262 Difficulty in walking, not elsewhere classified: Secondary | ICD-10-CM

## 2022-02-13 NOTE — Therapy (Signed)
OUTPATIENT PHYSICAL THERAPY NEURO TREATMENT NOTE   Patient Name: Curtis Powell MRN: [id number] DOB:Mar 12, 1966, 56 y.o., male Today's Date: 02/13/2022   PCP: Valera Castle, MD REFERRING PROVIDER: Valera Castle, MD   PT End of Session - 02/13/22 1319     Visit Number 96    Number of Visits 109    Date for PT Re-Evaluation 03/27/22    Authorization Type UHC Medicare; [place] Medicaid    Authorization Time Period 01/02/2022-03/27/2022    Progress Note Due on Visit 100    PT Start Time 1300    PT Stop Time 1340    PT Time Calculation (min) 40 min    Equipment Utilized During Treatment Gait belt    Activity Tolerance Patient tolerated treatment well;No increased pain    Behavior During Therapy WFL for tasks assessed/performed                 Past Medical History:  Diagnosis Date   Asthma    GERD (gastroesophageal reflux disease)    Hyperlipidemia    Hypertension    Obesity    Stroke [facility])    Past Surgical History:  Procedure Laterality Date   BRAIN SURGERY     Patient Active Problem List   Diagnosis Date Noted   Impaired glucose tolerance 03/23/2020   Learning disability 03/23/2020   Urinary and fecal incontinence 06/14/2019   Chronic pain syndrome 03/23/2019   Constipation 03/23/2019   Dysarthria 03/23/2019   Dysphagia 03/23/2019   Learning difficulty 03/23/2019   Morbid obesity ([place]) 03/23/2019   Muscle weakness 03/23/2019   Vitamin D deficiency 03/23/2019   Bilateral carotid artery stenosis 12/15/2018   Moderate aortic valve stenosis 12/15/2018   BMI 45.0-49.9, adult ([place]) 11/10/2018   Cerebrovascular accident ([place]) 05/02/2016   Hemiparesis affecting right side as late effect of cerebrovascular accident ([place]) 05/01/2016   Brain tumor ([place]) 11/24/2014   Gastro-esophageal reflux disease without esophagitis 11/24/2014   Accident due to mechanical fall without injury 08/03/2014   Essential hypertension 06/13/2014   Mixed hyperlipidemia  06/13/2014   Unspecified sequelae of cerebral infarction 06/13/2014   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/2014   Type 2 diabetes mellitus without complication ([place]) 46/28/6381   Neck mass 09/08/2013   Swimmer's ear 09/08/2013   Erectile dysfunction 01/03/2013   Prediabetes 01/03/2013    ONSET DATE: 05/09/2016  REFERRING DIAG: CVA (cerebral vascular accident) ([place])  THERAPY DIAG:  Unsteadiness on feet  Difficulty in walking, not elsewhere classified  Other abnormalities of gait and mobility  Abnormality of gait and mobility  Muscle weakness (generalized)  Other lack of coordination  Repeated falls  Left-sided weakness  Hemiplegia and hemiparesis following cerebral infarction affecting right dominant side ([facility])  Rationale for Evaluation and Treatment Rehabilitation  SUBJECTIVE:  SUBJECTIVE STATEMENT No updates since prior session. No falls. Felt fine after last PT session.   Pt accompanied by: family member  PERTINENT HISTORY: History of CVA Feb 2018. He was previously fully independent, but this stroke caused significant deficits, mainly on the right side. He reports difficulty walking and transfers, uses a WC for most  His mom had to move in with him and now is his main caregiver. Patient had outpatient PT from November 2018-March 2022. Patient reports since being discharged from outpatient therapy he has kept up with his HEP daily. He does report desire for walking better and for getting in and out of bed easier. He reports his mom is helping with some ADLs. He reports needing help with rolling in bed. He reports his mom/aide assist with dressing. He has a home aide 3 days out of the week." PMH significant for hx of brain tumor, past CVA, and HTN. See chart for additional information.  PAIN:  Are you  having pain? No  PRECAUTIONS: None  WEIGHT BEARING RESTRICTIONS No  PLOF: Independent  PATIENT GOALS: "to walk and sit-to-stand"   OBJECTIVE:   DIAGNOSTIC FINDINGS: None recent    TODAY'S TREATMENT: -STS from WC c PRW -AMB 3 minutes, 5lb AW bilat, pediatric RW, minGuard Assist -2 minutes sit break  (42 feet)  -STS from WC c PRW -AMB 3 minutes, 5lb AW bilat, pediatric RW, minGuard Assist -2 minutes sit break   -STS from WC c PRW -AMB 2.5 minutes, 5lb AW bilat, pediatric RW, minGuard Assist -2 minutes sit break  (38 feet)  -STS from WC c PRW -AMB 2.5 minutes, 5lb AW bilat, pediatric RW, minGuard Assist -2 minutes sit break  (46 feet)  -pt needs BR break   -STS from WC, turn hips toward wall: standing trunk extension scapulae to wall x12 with gentle overpressure.     PATIENT EDUCATION: Education details: safety with high intensity Person educated: Patient Education method: Explanation, Demonstration, Tactile cues, and Verbal cues Education comprehension: verbalized understanding, returned demonstration, and verbal cues required   [place] updates on this date, pt to continue as previously given  11/19/21:  Pt instructed on locations that may carry ankle weights for exercises to be performed at home.  Instructed to continue with daily walking.  GOALS: Goals reviewed with patient? No  SHORT TERM GOALS: Target date: 11/19/2021  Pt will be independent with HEP in order to improve strength and balance in order to decrease fall risk and improve function at home. Baseline: 8/28: pt reports doing HEP every day; 01/02/2022= Patient reports performing seated LE strengthening and some standing/walking at home. Goal status: GOAL MET   LONG TERM GOALS: Target date: 03/27/2022  Pt will increase FOTO to at least 57 in order to demonstrate improvement in function related to mobility and QoL. Baseline: 7/3: 50; 8/28: deferred to next session; 01/02/2022=  47% Goal status: ONGOING  2.  Pt (< 41 yrs old) will complete 5xSTS test < 20 seconds indicating an increase in LE strength and improved balance. Baseline: 7/3: 24mn:01seconds, 8/28: 40.5 seconds; 01/02/2022= difficulty with standing today- patient not feeling well and will retest next visit; 10/16: 31 seconds Goal status: ONGOING  3.  Pt will increase 10MWT to > 0.5 m/s (30 sec) as to improve gait speed for better home ambulation and reduce fall risk. Baseline: 7/3: 0.11 m/s with bRW; 8/30: 0.14 m/s with RW and WC follow; 01/02/2022= 0.14 m/s with Bariatric RW Goal status: ONGOING   4.  Pt will increase BLE gross strength to 4+/5 as to improve functional strenth for independent gait, increased standing tolerance, and increased ADL ability. Baseline: 7/3: grossly 4-/5 (see strength section); 8/28: 4+/5  Goal status: MET  5.  Pt will ambulate 250' with LRAD and supervision with no rest breaks to allow for improved mobility within home and increased independence. Baseline: 7/3: 60.3'; 8/28: 97 ft; 01/02/2022= 75 feet with bariatric RW, CGA with close w/c follow- stopped today due to left hand pain and not feeling well.  Goal status: ONGOING  ASSESSMENT:  CLINICAL IMPRESSION: Continued with current plan of care as laid out in evaluation and recent prior sessions. Pt remains motivated to advance progress toward goals in order to maximize independence and safety at home. Increased AW this dat to 4lb which pt reports is not that heavy, likely needs to be heavier to achiver greater AMB intensity. Pt continues to demonstrate progress toward goals AEB progression of interventions this date either in volume or intensity.   OBJECTIVE IMPAIRMENTS Abnormal gait, decreased activity tolerance, decreased balance, decreased coordination, decreased endurance, decreased mobility, difficulty walking, decreased ROM, decreased strength, postural dysfunction, and obesity.   ACTIVITY LIMITATIONS carrying, lifting,  bending, sitting, standing, squatting, stairs, transfers, bed mobility, dressing, and locomotion level  PARTICIPATION LIMITATIONS: cleaning, laundry, interpersonal relationship, driving, and community activity  PERSONAL FACTORS Past/current experiences, Time since onset of injury/illness/exacerbation, and 3+ comorbidities: hx of brain tumor, past CVA, HTN, obesity, arthritis  are also affecting patient's functional outcome.   REHAB POTENTIAL: Good  CLINICAL DECISION MAKING: Stable/uncomplicated  EVALUATION COMPLEXITY: Low  PLAN: PT FREQUENCY: 2x/week  PT DURATION: 12 weeks  PLANNED INTERVENTIONS: Therapeutic exercises, Therapeutic activity, Neuromuscular re-education, Balance training, Gait training, Patient/Family education, Joint mobilization, Stair training, Vestibular training, Orthotic/Fit training, DME instructions, Dry Needling, Electrical stimulation, Wheelchair mobility training, Cryotherapy, Moist heat, Compression bandaging, Manual therapy, and Re-evaluation  PLAN FOR NEXT SESSION: strengthening, gait, endurance training, continue plan  1:24 PM, 02/13/22 Etta Grandchild, PT, DPT Physical Therapist - [place] [phone]      Physical Therapist- [facility]  02/13/2022, 1:24 PM

## 2022-02-18 ENCOUNTER — Ambulatory Visit: Payer: Medicare Other

## 2022-02-18 NOTE — Therapy (Incomplete)
OUTPATIENT PHYSICAL THERAPY NEURO TREATMENT NOTE   Patient Name: Curtis Powell MRN: [id number] DOB:1965-04-24, 56 y.o., male Today's Date: 02/18/2022  PCP: Valera Castle, MD REFERRING PROVIDER: Valera Castle, MD   Past Medical History:  Diagnosis Date   Asthma    GERD (gastroesophageal reflux disease)    Hyperlipidemia    Hypertension    Obesity    Stroke [facility])    Past Surgical History:  Procedure Laterality Date   BRAIN SURGERY     Patient Active Problem List   Diagnosis Date Noted   Impaired glucose tolerance 03/23/2020   Learning disability 03/23/2020   Urinary and fecal incontinence 06/14/2019   Chronic pain syndrome 03/23/2019   Constipation 03/23/2019   Dysarthria 03/23/2019   Dysphagia 03/23/2019   Learning difficulty 03/23/2019   Morbid obesity ([place]) 03/23/2019   Muscle weakness 03/23/2019   Vitamin D deficiency 03/23/2019   Bilateral carotid artery stenosis 12/15/2018   Moderate aortic valve stenosis 12/15/2018   BMI 45.0-49.9, adult ([place]) 11/10/2018   Cerebrovascular accident ([place]) 05/02/2016   Hemiparesis affecting right side as late effect of cerebrovascular accident ([place]) 05/01/2016   Brain tumor ([place]) 11/24/2014   Gastro-esophageal reflux disease without esophagitis 11/24/2014   Accident due to mechanical fall without injury 08/03/2014   Essential hypertension 06/13/2014   Mixed hyperlipidemia 06/13/2014   Unspecified sequelae of cerebral infarction 06/13/2014   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/2014   Type 2 diabetes mellitus without complication ([place]) 43/15/4008   Neck mass 09/08/2013   Swimmer's ear 09/08/2013   Erectile dysfunction 01/03/2013   Prediabetes 01/03/2013    ONSET DATE: 05/09/2016  REFERRING DIAG: CVA (cerebral vascular accident) ([place])  THERAPY DIAG:  No diagnosis found.  Rationale for Evaluation and Treatment Rehabilitation  SUBJECTIVE:                                                                                                                                                                                              SUBJECTIVE STATEMENT  ***  No updates since prior session. No falls. Felt fine after last PT session.   Pt accompanied by: family member  PERTINENT HISTORY: History of CVA Feb 2018. He was previously fully independent, but this stroke caused significant deficits, mainly on the right side. He reports difficulty walking and transfers, uses a WC for most  His mom had to move in with him and now is his main caregiver. Patient had outpatient PT from November 2018-March 2022. Patient reports since being discharged from outpatient therapy he has kept up with his HEP daily. He does report desire for walking better and for getting in and  out of bed easier. He reports his mom is helping with some ADLs. He reports needing help with rolling in bed. He reports his mom/aide assist with dressing. He has a home aide 3 days out of the week." PMH significant for hx of brain tumor, past CVA, and HTN. See chart for additional information.  PAIN:  Are you having pain? No  PRECAUTIONS: None  WEIGHT BEARING RESTRICTIONS No  PLOF: Independent  PATIENT GOALS: "to walk and sit-to-stand"   OBJECTIVE:   DIAGNOSTIC FINDINGS: None recent    TODAY'S TREATMENT: -STS from WC c PRW -AMB 3 minutes, 5lb AW bilat, pediatric RW, minGuard Assist -2 minutes sit break  (42 feet)  -STS from WC c PRW -AMB 3 minutes, 5lb AW bilat, pediatric RW, minGuard Assist -2 minutes sit break   -STS from WC c PRW -AMB 2.5 minutes, 5lb AW bilat, pediatric RW, minGuard Assist -2 minutes sit break  (38 feet)  -STS from WC c PRW -AMB 2.5 minutes, 5lb AW bilat, pediatric RW, minGuard Assist -2 minutes sit break  (46 feet)  -pt needs BR break   -STS from WC, turn hips toward wall: standing trunk extension scapulae to wall x12 with gentle overpressure.     PATIENT EDUCATION: Education details: safety with high  intensity Person educated: Patient Education method: Explanation, Demonstration, Tactile cues, and Verbal cues Education comprehension: verbalized understanding, returned demonstration, and verbal cues required   [place] updates on this date, pt to continue as previously given  11/19/21:  Pt instructed on locations that may carry ankle weights for exercises to be performed at home.  Instructed to continue with daily walking.  GOALS: Goals reviewed with patient? No  SHORT TERM GOALS: Target date: 11/19/2021  Pt will be independent with HEP in order to improve strength and balance in order to decrease fall risk and improve function at home. Baseline: 8/28: pt reports doing HEP every day; 01/02/2022= Patient reports performing seated LE strengthening and some standing/walking at home. Goal status: GOAL MET   LONG TERM GOALS: Target date: 03/27/2022  Pt will increase FOTO to at least 57 in order to demonstrate improvement in function related to mobility and QoL. Baseline: 7/3: 50; 8/28: deferred to next session; 01/02/2022= 47% Goal status: ONGOING  2.  Pt (< 9 yrs old) will complete 5xSTS test < 20 seconds indicating an increase in LE strength and improved balance. Baseline: 7/3: 85mn:01seconds, 8/28: 40.5 seconds; 01/02/2022= difficulty with standing today- patient not feeling well and will retest next visit; 10/16: 31 seconds Goal status: ONGOING  3.  Pt will increase 10MWT to > 0.5 m/s (30 sec) as to improve gait speed for better home ambulation and reduce fall risk. Baseline: 7/3: 0.11 m/s with bRW; 8/30: 0.14 m/s with RW and WC follow; 01/02/2022= 0.14 m/s with Bariatric RW Goal status: ONGOING   4.  Pt will increase BLE gross strength to 4+/5 as to improve functional strenth for independent gait, increased standing tolerance, and increased ADL ability. Baseline: 7/3: grossly 4-/5 (see strength section); 8/28: 4+/5  Goal status: MET  5.  Pt will ambulate 250'  with LRAD and supervision with no rest breaks to allow for improved mobility within home and increased independence. Baseline: 7/3: 60.3'; 8/28: 97 ft; 01/02/2022= 75 feet with bariatric RW, CGA with close w/c follow- stopped today due to left hand pain and not feeling well.  Goal status: ONGOING  ASSESSMENT:  CLINICAL IMPRESSION: ***  Continued with current plan of care as  laid out in evaluation and recent prior sessions. Pt remains motivated to advance progress toward goals in order to maximize independence and safety at home. Increased AW this dat to 4lb which pt reports is not that heavy, likely needs to be heavier to achiver greater AMB intensity. Pt continues to demonstrate progress toward goals AEB progression of interventions this date either in volume or intensity.   OBJECTIVE IMPAIRMENTS Abnormal gait, decreased activity tolerance, decreased balance, decreased coordination, decreased endurance, decreased mobility, difficulty walking, decreased ROM, decreased strength, postural dysfunction, and obesity.   ACTIVITY LIMITATIONS carrying, lifting, bending, sitting, standing, squatting, stairs, transfers, bed mobility, dressing, and locomotion level  PARTICIPATION LIMITATIONS: cleaning, laundry, interpersonal relationship, driving, and community activity  PERSONAL FACTORS Past/current experiences, Time since onset of injury/illness/exacerbation, and 3+ comorbidities: hx of brain tumor, past CVA, HTN, obesity, arthritis  are also affecting patient's functional outcome.   REHAB POTENTIAL: Good  CLINICAL DECISION MAKING: Stable/uncomplicated  EVALUATION COMPLEXITY: Low  PLAN: PT FREQUENCY: 2x/week  PT DURATION: 12 weeks  PLANNED INTERVENTIONS: Therapeutic exercises, Therapeutic activity, Neuromuscular re-education, Balance training, Gait training, Patient/Family education, Joint mobilization, Stair training, Vestibular training, Orthotic/Fit training, DME instructions, Dry Needling,  Electrical stimulation, Wheelchair mobility training, Cryotherapy, Moist heat, Compression bandaging, Manual therapy, and Re-evaluation  PLAN FOR NEXT SESSION: strengthening, gait, endurance training, continue plan  Sudie Bailey SPT   Physical Therapist- [facility]  02/18/2022, 7:23 AM

## 2022-02-20 ENCOUNTER — Ambulatory Visit: Payer: Medicare Other | Admitting: Physical Therapy

## 2022-02-20 DIAGNOSIS — R2681 Unsteadiness on feet: Secondary | ICD-10-CM | POA: Diagnosis not present

## 2022-02-20 DIAGNOSIS — R2689 Other abnormalities of gait and mobility: Secondary | ICD-10-CM

## 2022-02-20 DIAGNOSIS — R262 Difficulty in walking, not elsewhere classified: Secondary | ICD-10-CM

## 2022-02-20 DIAGNOSIS — R269 Unspecified abnormalities of gait and mobility: Secondary | ICD-10-CM

## 2022-02-20 NOTE — Therapy (Signed)
OUTPATIENT PHYSICAL THERAPY NEURO TREATMENT NOTE   Patient Name: Curtis Powell MRN: [id number] DOB:01-14-1966, 56 y.o., male Today's Date: 02/20/2022   PCP: Curtis Castle, MD REFERRING PROVIDER: Valera Castle, MD   PT End of Session - 02/20/22 1303     Visit Number 97    Number of Visits 109    Date for PT Re-Evaluation 03/27/22    Authorization Type [employer] Medicare; [place] Medicaid    Authorization Time Period 01/02/2022-03/27/2022    Progress Note Due on Visit 100    PT Start Time 1301    PT Stop Time 1342    PT Time Calculation (min) 41 min    Equipment Utilized During Treatment Gait belt    Activity Tolerance Patient tolerated treatment well;No increased pain    Behavior During Therapy WFL for tasks assessed/performed                 Past Medical History:  Diagnosis Date   Asthma    GERD (gastroesophageal reflux disease)    Hyperlipidemia    Hypertension    Obesity    Stroke [facility])    Past Surgical History:  Procedure Laterality Date   BRAIN SURGERY     Patient Active Problem List   Diagnosis Date Noted   Impaired glucose tolerance 03/23/2020   Learning disability 03/23/2020   Urinary and fecal incontinence 06/14/2019   Chronic pain syndrome 03/23/2019   Constipation 03/23/2019   Dysarthria 03/23/2019   Dysphagia 03/23/2019   Learning difficulty 03/23/2019   Morbid obesity ([place]) 03/23/2019   Muscle weakness 03/23/2019   Vitamin D deficiency 03/23/2019   Bilateral carotid artery stenosis 12/15/2018   Moderate aortic valve stenosis 12/15/2018   BMI 45.0-49.9, adult ([place]) 11/10/2018   Cerebrovascular accident ([place]) 05/02/2016   Hemiparesis affecting right side as late effect of cerebrovascular accident ([place]) 05/01/2016   Brain tumor ([place]) 11/24/2014   Gastro-esophageal reflux disease without esophagitis 11/24/2014   Accident due to mechanical fall without injury 08/03/2014   Essential hypertension 06/13/2014   Mixed hyperlipidemia  06/13/2014   Unspecified sequelae of cerebral infarction 06/13/2014   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/2014   Type 2 diabetes mellitus without complication ([place]) 58/85/0277   Neck mass 09/08/2013   Swimmer's ear 09/08/2013   Erectile dysfunction 01/03/2013   Prediabetes 01/03/2013    ONSET DATE: 05/09/2016  REFERRING DIAG: CVA (cerebral vascular accident) ([place])  THERAPY DIAG:  Unsteadiness on feet  Difficulty in walking, not elsewhere classified  Other abnormalities of gait and mobility  Abnormality of gait and mobility  Rationale for Evaluation and Treatment Rehabilitation  SUBJECTIVE:  SUBJECTIVE STATEMENT No updates since prior session. No falls. Pt reports doing well.   Pt accompanied by: family member  PERTINENT HISTORY: History of CVA Feb 2018. He was previously fully independent, but this stroke caused significant deficits, mainly on the right side. He reports difficulty walking and transfers, uses a WC for most  His mom had to move in with him and now is his main caregiver. Patient had outpatient PT from November 2018-March 2022. Patient reports since being discharged from outpatient therapy he has kept up with his HEP daily. He does report desire for walking better and for getting in and out of bed easier. He reports his mom is helping with some ADLs. He reports needing help with rolling in bed. He reports his mom/aide assist with dressing. He has a home aide 3 days out of the week." PMH significant for hx of brain tumor, past CVA, and HTN. See chart for additional information.  PAIN:  Are you having pain? No  PRECAUTIONS: None  WEIGHT BEARING RESTRICTIONS No  PLOF: Independent  PATIENT GOALS: "to walk and sit-to-stand"   OBJECTIVE:   DIAGNOSTIC FINDINGS: None recent    TODAY'S  TREATMENT:  STSx 4 with sig UE assist, CGA and with walker anteriorly. Very poor control of balance and STS ability this date, pt pushes WC posteriorly and continues with this despite corrective cues. Transitioned to seated therex for LE warm up/ muscle activation.   4# AW donned, 2 x 10 LAQ 2 x 10 seated marching with 4# AW  -STS from WC c PRW -4 lb AW bilat, pediatric RW, minGuard Assist -2 minutes sit break   -STS from WC c PRW -4 lb, pediatric RW, minGuard Assist -2 minutes sit break   -STS from WC c PRW -4 lb, pediatric RW, minGuard Assist -2 minutes sit break   -STS from WC c PRW -4 lb AW bilat, pediatric RW, minGuard Assist -2 minutes sit break     PATIENT EDUCATION: Education details: safety with high intensity Person educated: Patient Education method: Explanation, Demonstration, Tactile cues, and Verbal cues Education comprehension: verbalized understanding, returned demonstration, and verbal cues required   HOME EXERCISE PROGRAM:no updates on this date, pt to continue as previously given  11/19/21:  Pt instructed on locations that may carry ankle weights for exercises to be performed at home.  Instructed to continue with daily walking.  GOALS: Goals reviewed with patient? No  SHORT TERM GOALS: Target date: 11/19/2021  Pt will be independent with HEP in order to improve strength and balance in order to decrease fall risk and improve function at home. Baseline: 8/28: pt reports doing HEP every day; 01/02/2022= Patient reports performing seated LE strengthening and some standing/walking at home. Goal status: GOAL MET   LONG TERM GOALS: Target date: 03/27/2022  Pt will increase FOTO to at least 57 in order to demonstrate improvement in function related to mobility and QoL. Baseline: 7/3: 50; 8/28: deferred to next session; 01/02/2022= 47% Goal status: ONGOING  2.  Pt (< 41 yrs old) will complete 5xSTS test < 20 seconds indicating an increase in LE strength and  improved balance. Baseline: 7/3: 96mn:01seconds, 8/28: 40.5 seconds; 01/02/2022= difficulty with standing today- patient not feeling well and will retest next visit; 10/16: 31 seconds Goal status: ONGOING  3.  Pt will increase 10MWT to > 0.5 m/s (30 sec) as to improve gait speed for better home ambulation and reduce fall risk. Baseline: 7/3: 0.11 m/s with bRW; 8/30: 0.14 m/s with  RW and WC follow; 01/02/2022= 0.14 m/s with Bariatric RW Goal status: ONGOING   4.  Pt will increase BLE gross strength to 4+/5 as to improve functional strenth for independent gait, increased standing tolerance, and increased ADL ability. Baseline: 7/3: grossly 4-/5 (see strength section); 8/28: 4+/5  Goal status: MET  5.  Pt will ambulate 250' with LRAD and supervision with no rest breaks to allow for improved mobility within home and increased independence. Baseline: 7/3: 60.3'; 8/28: 97 ft; 01/02/2022= 75 feet with bariatric RW, CGA with close w/c follow- stopped today due to left hand pain and not feeling well.  Goal status: ONGOING  ASSESSMENT:  CLINICAL IMPRESSION: Continued with current plan of care as laid out in evaluation and recent prior sessions. Pt remains motivated to advance progress toward goals in order to maximize independence and safety at home. Decreased AW to 4# this date secondary to some weakness noted since last session but could benefit from 5# AW next session. Pt showing decreased ability to perform STS this date with safety concerns with maintaining standing posture.  Pt continues to demonstrate progress toward goals AEB progression of interventions this date either in volume or intensity.   OBJECTIVE IMPAIRMENTS Abnormal gait, decreased activity tolerance, decreased balance, decreased coordination, decreased endurance, decreased mobility, difficulty walking, decreased ROM, decreased strength, postural dysfunction, and obesity.   ACTIVITY LIMITATIONS carrying, lifting, bending, sitting,  standing, squatting, stairs, transfers, bed mobility, dressing, and locomotion level  PARTICIPATION LIMITATIONS: cleaning, laundry, interpersonal relationship, driving, and community activity  PERSONAL FACTORS Past/current experiences, Time since onset of injury/illness/exacerbation, and 3+ comorbidities: hx of brain tumor, past CVA, HTN, obesity, arthritis  are also affecting patient's functional outcome.   REHAB POTENTIAL: Good  CLINICAL DECISION MAKING: Stable/uncomplicated  EVALUATION COMPLEXITY: Low  PLAN: PT FREQUENCY: 2x/week  PT DURATION: 12 weeks  PLANNED INTERVENTIONS: Therapeutic exercises, Therapeutic activity, Neuromuscular re-education, Balance training, Gait training, Patient/Family education, Joint mobilization, Stair training, Vestibular training, Orthotic/Fit training, DME instructions, Dry Needling, Electrical stimulation, Wheelchair mobility training, Cryotherapy, Moist heat, Compression bandaging, Manual therapy, and Re-evaluation  PLAN FOR NEXT SESSION: strengthening, gait, endurance training, continue plan  2:49 PM, 02/20/22 Rivka Barbara PT, DPT      Physical Therapist- [facility]  02/20/2022, 2:49 PM

## 2022-02-25 ENCOUNTER — Ambulatory Visit: Payer: Medicare Other

## 2022-02-25 DIAGNOSIS — R2681 Unsteadiness on feet: Secondary | ICD-10-CM | POA: Diagnosis not present

## 2022-02-25 DIAGNOSIS — R278 Other lack of coordination: Secondary | ICD-10-CM

## 2022-02-25 DIAGNOSIS — M6281 Muscle weakness (generalized): Secondary | ICD-10-CM

## 2022-02-25 DIAGNOSIS — R262 Difficulty in walking, not elsewhere classified: Secondary | ICD-10-CM

## 2022-02-25 NOTE — Therapy (Signed)
OUTPATIENT PHYSICAL THERAPY NEURO TREATMENT NOTE   Patient Name: Curtis Powell: [id number] DOB:25-Dec-1965, 56 y.o., male Today's Date: 02/25/2022   PCP: Valera Castle, MD REFERRING PROVIDER: Valera Castle, MD   PT End of Session - 02/25/22 1300     Visit Number 58    Number of Visits 109    Date for PT Re-Evaluation 03/27/22    Authorization Type UHC Medicare; [place] Medicaid    Authorization Time Period 01/02/2022-03/27/2022    Progress Note Due on Visit 100    PT Start Time 1301    PT Stop Time 1343    PT Time Calculation (min) 42 min    Equipment Utilized During Treatment Gait belt    Activity Tolerance Patient tolerated treatment well;No increased pain    Behavior During Therapy WFL for tasks assessed/performed                 Past Medical History:  Diagnosis Date   Asthma    GERD (gastroesophageal reflux disease)    Hyperlipidemia    Hypertension    Obesity    Stroke [facility])    Past Surgical History:  Procedure Laterality Date   BRAIN SURGERY     Patient Active Problem List   Diagnosis Date Noted   Impaired glucose tolerance 03/23/2020   Learning disability 03/23/2020   Urinary and fecal incontinence 06/14/2019   Chronic pain syndrome 03/23/2019   Constipation 03/23/2019   Dysarthria 03/23/2019   Dysphagia 03/23/2019   Learning difficulty 03/23/2019   Morbid obesity ([place]) 03/23/2019   Muscle weakness 03/23/2019   Vitamin D deficiency 03/23/2019   Bilateral carotid artery stenosis 12/15/2018   Moderate aortic valve stenosis 12/15/2018   BMI 45.0-49.9, adult ([place]) 11/10/2018   Cerebrovascular accident ([place]) 05/02/2016   Hemiparesis affecting right side as late effect of cerebrovascular accident ([place]) 05/01/2016   Brain tumor ([place]) 11/24/2014   Gastro-esophageal reflux disease without esophagitis 11/24/2014   Accident due to mechanical fall without injury 08/03/2014   Essential hypertension 06/13/2014   Mixed hyperlipidemia  06/13/2014   Unspecified sequelae of cerebral infarction 06/13/2014   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/2014   Type 2 diabetes mellitus without complication ([place]) 11/65/7903   Neck mass 09/08/2013   Swimmer's ear 09/08/2013   Erectile dysfunction 01/03/2013   Prediabetes 01/03/2013    ONSET DATE: 05/09/2016  REFERRING DIAG: CVA (cerebral vascular accident) ([place])  THERAPY DIAG:  Unsteadiness on feet  Muscle weakness (generalized)  Difficulty in walking, not elsewhere classified  Other lack of coordination  Rationale for Evaluation and Treatment Rehabilitation  SUBJECTIVE:  SUBJECTIVE STATEMENT Pt reports no falls, no pain. Reports he needs a new leg brace and reports current one is broken. Pt is not wearing a brace currently and didn't realize this.  PT asks pt to bring in his brace Wednesday.  Pt accompanied by: family member  PERTINENT HISTORY: History of CVA Feb 2018. He was previously fully independent, but this stroke caused significant deficits, mainly on the right side. He reports difficulty walking and transfers, uses a WC for most  His mom had to move in with him and now is his main caregiver. Patient had outpatient PT from November 2018-March 2022. Patient reports since being discharged from outpatient therapy he has kept up with his HEP daily. He does report desire for walking better and for getting in and out of bed easier. He reports his mom is helping with some ADLs. He reports needing help with rolling in bed. He reports his mom/aide assist with dressing. He has a home aide 3 days out of the week." PMH significant for hx of brain tumor, past CVA, and HTN. See chart for additional information.  PAIN:  Are you having pain? No  PRECAUTIONS: None  WEIGHT BEARING RESTRICTIONS No  PLOF:  Independent  PATIENT GOALS: "to walk and sit-to-stand"   OBJECTIVE:   DIAGNOSTIC FINDINGS: None recent    TODAY'S TREATMENT:  TherEx Amb with bariatric RW 1x75 ft with 3# weights donned each LE. Pt requests rest break  3# AW donned each LE: March 2x16 alt LE LAQ 2x10 each LE WC hamstring curls/walks 2x10 meters. Cues for increased speed, but pt unable to and performs slowly  STS to RW 10x, 6x. CGA throughout except for last rep which was min assist. Cuing for hip ext. to improve balance.      PATIENT EDUCATION: Education details: Pt educated throughout session about proper posture and technique with exercises. Improved exercise technique, movement at target joints, use of target muscles after min to mod verbal, visual, tactile cues.  Person educated: Patient Education method: Explanation, Demonstration, Tactile cues, and Verbal cues Education comprehension: verbalized understanding, returned demonstration, and verbal cues required   [place] updates on this date, pt to continue as previously given  11/19/21:  Pt instructed on locations that may carry ankle weights for exercises to be performed at home.  Instructed to continue with daily walking.  GOALS: Goals reviewed with patient? No  SHORT TERM GOALS: Target date: 11/19/2021  Pt will be independent with HEP in order to improve strength and balance in order to decrease fall risk and improve function at home. Baseline: 8/28: pt reports doing HEP every day; 01/02/2022= Patient reports performing seated LE strengthening and some standing/walking at home. Goal status: GOAL MET   LONG TERM GOALS: Target date: 03/27/2022  Pt will increase FOTO to at least 57 in order to demonstrate improvement in function related to mobility and QoL. Baseline: 7/3: 50; 8/28: deferred to next session; 01/02/2022= 47% Goal status: ONGOING  2.  Pt (< 52 yrs old) will complete 5xSTS test < 20 seconds indicating an increase in  LE strength and improved balance. Baseline: 7/3: 16mn:01seconds, 8/28: 40.5 seconds; 01/02/2022= difficulty with standing today- patient not feeling well and will retest next visit; 10/16: 31 seconds Goal status: ONGOING  3.  Pt will increase 10MWT to > 0.5 m/s (30 sec) as to improve gait speed for better home ambulation and reduce fall risk. Baseline: 7/3: 0.11 m/s with bRW; 8/30: 0.14 m/s with RW and WC follow;  01/02/2022= 0.14 m/s with Bariatric RW Goal status: ONGOING   4.  Pt will increase BLE gross strength to 4+/5 as to improve functional strenth for independent gait, increased standing tolerance, and increased ADL ability. Baseline: 7/3: grossly 4-/5 (see strength section); 8/28: 4+/5  Goal status: MET  5.  Pt will ambulate 250' with LRAD and supervision with no rest breaks to allow for improved mobility within home and increased independence. Baseline: 7/3: 60.3'; 8/28: 97 ft; 01/02/2022= 75 feet with bariatric RW, CGA with close w/c follow- stopped today due to left hand pain and not feeling well.  Goal status: ONGOING  ASSESSMENT:  CLINICAL IMPRESSION: Pt with slightly less tolerance for ambulation for endurance with 3# weights today. He requests rest break at 31 ft and is generally quick to exhibit/report fatigue with interventions. PT provided significant cues for safe STS technique. Pt often with insufficient hip ext with STS resulting in posterior LOB into chair. Balance improved when pt applied technique. The pt will benefit from further skilled PT to improve strength, balance, gait and mobility to decrease fall risk and increase QOL.   OBJECTIVE IMPAIRMENTS Abnormal gait, decreased activity tolerance, decreased balance, decreased coordination, decreased endurance, decreased mobility, difficulty walking, decreased ROM, decreased strength, postural dysfunction, and obesity.   ACTIVITY LIMITATIONS carrying, lifting, bending, sitting, standing, squatting, stairs, transfers, bed  mobility, dressing, and locomotion level  PARTICIPATION LIMITATIONS: cleaning, laundry, interpersonal relationship, driving, and community activity  PERSONAL FACTORS Past/current experiences, Time since onset of injury/illness/exacerbation, and 3+ comorbidities: hx of brain tumor, past CVA, HTN, obesity, arthritis  are also affecting patient's functional outcome.   REHAB POTENTIAL: Good  CLINICAL DECISION MAKING: Stable/uncomplicated  EVALUATION COMPLEXITY: Low  PLAN: PT FREQUENCY: 2x/week  PT DURATION: 12 weeks  PLANNED INTERVENTIONS: Therapeutic exercises, Therapeutic activity, Neuromuscular re-education, Balance training, Gait training, Patient/Family education, Joint mobilization, Stair training, Vestibular training, Orthotic/Fit training, DME instructions, Dry Needling, Electrical stimulation, Wheelchair mobility training, Cryotherapy, Moist heat, Compression bandaging, Manual therapy, and Re-evaluation  PLAN FOR NEXT SESSION: strengthening, gait, endurance training, continue plan  3:56 PM, 02/25/22 Ricard Dillon PT, DPT      Physical Therapist- [facility]  02/25/2022, 3:56 PM

## 2022-02-27 ENCOUNTER — Ambulatory Visit: Payer: Medicare Other

## 2022-02-27 DIAGNOSIS — R278 Other lack of coordination: Secondary | ICD-10-CM

## 2022-02-27 DIAGNOSIS — R262 Difficulty in walking, not elsewhere classified: Secondary | ICD-10-CM

## 2022-02-27 DIAGNOSIS — R2681 Unsteadiness on feet: Secondary | ICD-10-CM | POA: Diagnosis not present

## 2022-02-27 DIAGNOSIS — M6281 Muscle weakness (generalized): Secondary | ICD-10-CM

## 2022-02-27 NOTE — Therapy (Signed)
OUTPATIENT PHYSICAL THERAPY NEURO TREATMENT NOTE   Patient Name: Curtis Powell MRN: [id number] DOB:Mar 04, 1966, 56 y.o., male Today's Date: 02/27/2022   PCP: Valera Castle, MD REFERRING PROVIDER: Valera Castle, MD   PT End of Session - 02/27/22 1301     Visit Number 49    Number of Visits 109    Date for PT Re-Evaluation 03/27/22    Authorization Type UHC Medicare; [place] Medicaid    Authorization Time Period 01/02/2022-03/27/2022    Progress Note Due on Visit 100    PT Start Time 1303    PT Stop Time 1345    PT Time Calculation (min) 42 min    Equipment Utilized During Treatment Gait belt    Activity Tolerance Patient tolerated treatment well;No increased pain    Behavior During Therapy WFL for tasks assessed/performed                  Past Medical History:  Diagnosis Date   Asthma    GERD (gastroesophageal reflux disease)    Hyperlipidemia    Hypertension    Obesity    Stroke [facility])    Past Surgical History:  Procedure Laterality Date   BRAIN SURGERY     Patient Active Problem List   Diagnosis Date Noted   Impaired glucose tolerance 03/23/2020   Learning disability 03/23/2020   Urinary and fecal incontinence 06/14/2019   Chronic pain syndrome 03/23/2019   Constipation 03/23/2019   Dysarthria 03/23/2019   Dysphagia 03/23/2019   Learning difficulty 03/23/2019   Morbid obesity ([place]) 03/23/2019   Muscle weakness 03/23/2019   Vitamin D deficiency 03/23/2019   Bilateral carotid artery stenosis 12/15/2018   Moderate aortic valve stenosis 12/15/2018   BMI 45.0-49.9, adult ([place]) 11/10/2018   Cerebrovascular accident ([place]) 05/02/2016   Hemiparesis affecting right side as late effect of cerebrovascular accident ([place]) 05/01/2016   Brain tumor ([place]) 11/24/2014   Gastro-esophageal reflux disease without esophagitis 11/24/2014   Accident due to mechanical fall without injury 08/03/2014   Essential hypertension 06/13/2014   Mixed hyperlipidemia  06/13/2014   Unspecified sequelae of cerebral infarction 06/13/2014   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/2014   Type 2 diabetes mellitus without complication ([place]) 37/48/2707   Neck mass 09/08/2013   Swimmer's ear 09/08/2013   Erectile dysfunction 01/03/2013   Prediabetes 01/03/2013    ONSET DATE: 05/09/2016  REFERRING DIAG: CVA (cerebral vascular accident) ([place])  THERAPY DIAG:  Muscle weakness (generalized)  Unsteadiness on feet  Other lack of coordination  Difficulty in walking, not elsewhere classified  Rationale for Evaluation and Treatment Rehabilitation  SUBJECTIVE:  SUBJECTIVE STATEMENT Pt felt good following last appointment. He has brought his R AFO today and reports he feels it is no longer helping him due to being too worn out.  Pt accompanied by: family member  PERTINENT HISTORY: History of CVA Feb 2018. He was previously fully independent, but this stroke caused significant deficits, mainly on the right side. He reports difficulty walking and transfers, uses a WC for most  His mom had to move in with him and now is his main caregiver. Patient had outpatient PT from November 2018-March 2022. Patient reports since being discharged from outpatient therapy he has kept up with his HEP daily. He does report desire for walking better and for getting in and out of bed easier. He reports his mom is helping with some ADLs. He reports needing help with rolling in bed. He reports his mom/aide assist with dressing. He has a home aide 3 days out of the week." PMH significant for hx of brain tumor, past CVA, and HTN. See chart for additional information.  PAIN:  Are you having pain? No  PRECAUTIONS: None  WEIGHT BEARING RESTRICTIONS No  PLOF: Independent  PATIENT GOALS: "to walk and  sit-to-stand"   OBJECTIVE:   DIAGNOSTIC FINDINGS: None recent    TODAY'S TREATMENT:  TherEx  Pt brings in R AFO, straps are fairly worn and fraying, difficult to create snug fit. Pt reports his R foot drags and does not feel current AFO is supportive.   Amb with bariatric RW 1x30 meters with R AFO donned. Pt with AFO exhibits R toe catch/drag throughout, pt does not have AFO on L, but also exhibits toe catch/ foot drag on this side as well. Current AFO does not appear to be providing assistance with effective foot clearance.  Matrix cable machine: Rows- 2.5# 12x  BUE 7.5# 12x 2 sets  BUE Hamstring curls- 2.5# 2x12 each LE. Rates medium  Chair dips 3x5. Rates medium  Seated forward raises with weighted 1000 gr ball (promote upright posture, thoracic ext and UE strength) 2x10. Rates hard   Seated March 3x20  STS to RW 10x, 6x. CGA throughout except for last rep which was min assist. Cuing for hip ext. to improve balance.   PATIENT EDUCATION: Education details: Pt educated throughout session about proper posture and technique with exercises. Improved exercise technique, movement at target joints, use of target muscles after min to mod verbal, visual, tactile cues.  Person educated: Patient Education method: Explanation, Demonstration, Tactile cues, and Verbal cues Education comprehension: verbalized understanding, returned demonstration, and verbal cues required   [place] updates on this date, pt to continue as previously given  11/19/21:  Pt instructed on locations that may carry ankle weights for exercises to be performed at home.  Instructed to continue with daily walking.  GOALS: Goals reviewed with patient? No  SHORT TERM GOALS: Target date: 11/19/2021  Pt will be independent with HEP in order to improve strength and balance in order to decrease fall risk and improve function at home. Baseline: 8/28: pt reports doing HEP every day; 01/02/2022=  Patient reports performing seated LE strengthening and some standing/walking at home. Goal status: GOAL MET   LONG TERM GOALS: Target date: 03/27/2022  Pt will increase FOTO to at least 57 in order to demonstrate improvement in function related to mobility and QoL. Baseline: 7/3: 50; 8/28: deferred to next session; 01/02/2022= 47% Goal status: ONGOING  2.  Pt (< 28 yrs old) will complete 5xSTS test <  20 seconds indicating an increase in LE strength and improved balance. Baseline: 7/3: 38mn:01seconds, 8/28: 40.5 seconds; 01/02/2022= difficulty with standing today- patient not feeling well and will retest next visit; 10/16: 31 seconds Goal status: ONGOING  3.  Pt will increase 10MWT to > 0.5 m/s (30 sec) as to improve gait speed for better home ambulation and reduce fall risk. Baseline: 7/3: 0.11 m/s with bRW; 8/30: 0.14 m/s with RW and WC follow; 01/02/2022= 0.14 m/s with Bariatric RW Goal status: ONGOING   4.  Pt will increase BLE gross strength to 4+/5 as to improve functional strenth for independent gait, increased standing tolerance, and increased ADL ability. Baseline: 7/3: grossly 4-/5 (see strength section); 8/28: 4+/5  Goal status: MET  5.  Pt will ambulate 250' with LRAD and supervision with no rest breaks to allow for improved mobility within home and increased independence. Baseline: 7/3: 60.3'; 8/28: 97 ft; 01/02/2022= 75 feet with bariatric RW, CGA with close w/c follow- stopped today due to left hand pain and not feeling well.  Goal status: ONGOING  ASSESSMENT:  CLINICAL IMPRESSION: Pt brings in R AFO today to be assessed due to reports of poor assistance with gait. Upon exam, pt's current AFO is not providing sufficient toe/foot clearance, resulting in consistent toe catch/foot drag with each step. However, pt also noted to have same issue with LLE (no L AFO). PT going to provide form to pt's physician to pursue new B AFOs that can assist pt with decreasing toe-catch in order  to decrease fall risk with gait. The pt will benefit from further skilled PT to improve strength, balance, gait and mobility to decrease fall risk and increase QOL.   OBJECTIVE IMPAIRMENTS Abnormal gait, decreased activity tolerance, decreased balance, decreased coordination, decreased endurance, decreased mobility, difficulty walking, decreased ROM, decreased strength, postural dysfunction, and obesity.   ACTIVITY LIMITATIONS carrying, lifting, bending, sitting, standing, squatting, stairs, transfers, bed mobility, dressing, and locomotion level  PARTICIPATION LIMITATIONS: cleaning, laundry, interpersonal relationship, driving, and community activity  PERSONAL FACTORS Past/current experiences, Time since onset of injury/illness/exacerbation, and 3+ comorbidities: hx of brain tumor, past CVA, HTN, obesity, arthritis  are also affecting patient's functional outcome.   REHAB POTENTIAL: Good  CLINICAL DECISION MAKING: Stable/uncomplicated  EVALUATION COMPLEXITY: Low  PLAN: PT FREQUENCY: 2x/week  PT DURATION: 12 weeks  PLANNED INTERVENTIONS: Therapeutic exercises, Therapeutic activity, Neuromuscular re-education, Balance training, Gait training, Patient/Family education, Joint mobilization, Stair training, Vestibular training, Orthotic/Fit training, DME instructions, Dry Needling, Electrical stimulation, Wheelchair mobility training, Cryotherapy, Moist heat, Compression bandaging, Manual therapy, and Re-evaluation  PLAN FOR NEXT SESSION: strengthening, gait, endurance training, continue plan  4:16 PM, 02/27/22 HRicard DillonPT, DPT      Physical [facility]rap[facility]- [facility] 02/27/2022, 4:16 PM

## 2022-03-04 ENCOUNTER — Ambulatory Visit: Payer: Medicare Other

## 2022-03-04 DIAGNOSIS — R2681 Unsteadiness on feet: Secondary | ICD-10-CM | POA: Diagnosis not present

## 2022-03-04 DIAGNOSIS — M6281 Muscle weakness (generalized): Secondary | ICD-10-CM

## 2022-03-04 DIAGNOSIS — R262 Difficulty in walking, not elsewhere classified: Secondary | ICD-10-CM

## 2022-03-04 NOTE — Therapy (Signed)
OUTPATIENT PHYSICAL THERAPY NEURO TREATMENT NOTE/Physical Therapy Progress Note   Dates of reporting period  01/21/2022   to   03/04/2022    Patient Name: Curtis Powell MRN: [id number] DOB:07-01-1965, 56 y.o., male Today's Date: 03/05/2022   PCP: Valera Castle, MD REFERRING PROVIDER: Valera Castle, MD   PT End of Session - 03/04/22 1318     Visit Number 100    Number of Visits 109    Date for PT Re-Evaluation 03/27/22    Authorization Type UHC Medicare; [place] Medicaid    Authorization Time Period 01/02/2022-03/27/2022    Progress Note Due on Visit 100    PT Start Time 1301    PT Stop Time 1344    PT Time Calculation (min) 43 min    Equipment Utilized During Treatment Gait belt    Activity Tolerance Patient tolerated treatment well;No increased pain    Behavior During Therapy WFL for tasks assessed/performed                  Past Medical History:  Diagnosis Date   Asthma    GERD (gastroesophageal reflux disease)    Hyperlipidemia    Hypertension    Obesity    Stroke [facility])    Past Surgical History:  Procedure Laterality Date   BRAIN SURGERY     Patient Active Problem List   Diagnosis Date Noted   Impaired glucose tolerance 03/23/2020   Learning disability 03/23/2020   Urinary and fecal incontinence 06/14/2019   Chronic pain syndrome 03/23/2019   Constipation 03/23/2019   Dysarthria 03/23/2019   Dysphagia 03/23/2019   Learning difficulty 03/23/2019   Morbid obesity ([place]) 03/23/2019   Muscle weakness 03/23/2019   Vitamin D deficiency 03/23/2019   Bilateral carotid artery stenosis 12/15/2018   Moderate aortic valve stenosis 12/15/2018   BMI 45.0-49.9, adult ([place]) 11/10/2018   Cerebrovascular accident ([place]) 05/02/2016   Hemiparesis affecting right side as late effect of cerebrovascular accident ([place]) 05/01/2016   Brain tumor ([place]) 11/24/2014   Gastro-esophageal reflux disease without esophagitis 11/24/2014   Accident due to mechanical  fall without injury 08/03/2014   Essential hypertension 06/13/2014   Mixed hyperlipidemia 06/13/2014   Unspecified sequelae of cerebral infarction 06/13/2014   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/2014   Type 2 diabetes mellitus without complication ([place]) 70/35/0093   Neck mass 09/08/2013   Swimmer's ear 09/08/2013   Erectile dysfunction 01/03/2013   Prediabetes 01/03/2013    ONSET DATE: 05/09/2016  REFERRING DIAG: CVA (cerebral vascular accident) ([place])  THERAPY DIAG:  Unsteadiness on feet  Muscle weakness (generalized)  Difficulty in walking, not elsewhere classified  Rationale for Evaluation and Treatment Rehabilitation  SUBJECTIVE:  SUBJECTIVE STATEMENT Pt reports no aches/pains currently. Reports no falls/stumbles. He reports doing his HEP. He had an OK Thanksgiving.  Pt accompanied by: family member  PERTINENT HISTORY: History of CVA Feb 2018. He was previously fully independent, but this stroke caused significant deficits, mainly on the right side. He reports difficulty walking and transfers, uses a WC for most  His mom had to move in with him and now is his main caregiver. Patient had outpatient PT from November 2018-March 2022. Patient reports since being discharged from outpatient therapy he has kept up with his HEP daily. He does report desire for walking better and for getting in and out of bed easier. He reports his mom is helping with some ADLs. He reports needing help with rolling in bed. He reports his mom/aide assist with dressing. He has a home aide 3 days out of the week." PMH significant for hx of brain tumor, past CVA, and HTN. See chart for additional information.  PAIN:  Are you having pain? No  PRECAUTIONS: None  WEIGHT BEARING RESTRICTIONS No  PLOF: Independent  PATIENT GOALS: "to  walk and sit-to-stand"   OBJECTIVE:   DIAGNOSTIC FINDINGS: None recent    TODAY'S TREATMENT:   TherAct: Goals retested for progress note. PT instructed pt throughout in testing technique and indications of test performance on progress/plan. Please refer to goal section below for details.  TherEx  STS 1x5, 1x4  5# AW donned each LE- LAQ 2x20 each LE. Rates medium Seated March 2x10 each LE. Reports difficulty.   WC hamstring curls 20 ft - performed slowly due to fatigue   PATIENT EDUCATION: Education details: Pt educated throughout session about proper posture and technique with exercises. Improved exercise technique, movement at target joints, use of target muscles after min to mod verbal, visual, tactile cues. Goal retesting, plan  Person educated: Patient Education method: Explanation, Demonstration, Tactile cues, and Verbal cues Education comprehension: verbalized understanding, returned demonstration, and verbal cues required   [place] updates on this date, pt to continue as previously given  11/19/21:  Pt instructed on locations that may carry ankle weights for exercises to be performed at home.  Instructed to continue with daily walking.  GOALS: Goals reviewed with patient? No  SHORT TERM GOALS: Target date: 11/19/2021  Pt will be independent with HEP in order to improve strength and balance in order to decrease fall risk and improve function at home. Baseline: 8/28: pt reports doing HEP every day; 01/02/2022= Patient reports performing seated LE strengthening and some standing/walking at home. Goal status: GOAL MET   LONG TERM GOALS: Target date: 03/27/2022  Pt will increase FOTO to at least 57 in order to demonstrate improvement in function related to mobility and QoL. Baseline: 7/3: 50; 8/28: deferred to next session; 01/02/2022= 47%; 11/27: 40% Goal status: ONGOING  2.  Pt (< 82 yrs old) will complete 5xSTS test < 20 seconds indicating an  increase in LE strength and improved balance. Baseline: 7/3: 37mn:01seconds, 8/28: 40.5 seconds; 01/02/2022= difficulty with standing today- patient not feeling well and will retest next visit; 10/16: 31 seconds; 11/27: 62 seconds Goal status: ONGOING  3.  Pt will increase 10MWT to > 0.5 m/s (30 sec) as to improve gait speed for better home ambulation and reduce fall risk. Baseline: 7/3: 0.11 m/s with bRW; 8/30: 0.14 m/s with RW and WC follow; 01/02/2022= 0.14 m/s with Bariatric RW; 11/27: 0.13 m/s with bariatric RW Goal status: ONGOING   4.  Pt  will increase BLE gross strength to 4+/5 as to improve functional strenth for independent gait, increased standing tolerance, and increased ADL ability. Baseline: 7/3: grossly 4-/5 (see strength section); 8/28: 4+/5  Goal status: MET  5.  Pt will ambulate 250' with LRAD and supervision with no rest breaks to allow for improved mobility within home and increased independence. Baseline: 7/3: 60.3'; 8/28: 97 ft; 01/02/2022= 75 feet with bariatric RW, CGA with close w/c follow- stopped today due to left hand pain and not feeling well. ; 11/27 deferred Goal status: ONGOING  ASSESSMENT:  CLINICAL IMPRESSION: Goals reassessed for progress note. Pt with improved FOTO score, indicating increased perception of functional mobility and QOL. However, pt with decreased performance on both 5xSTS test and 10MWT. Other ambulation goal deferred to next 1-2 visits. PT and pt discussed recent plateau in progress on goals and to trial next few sessions (before next reassessment) to determine if pt performance can improve otherwise pt might be more appropriate for d/c from PT. Pt verbalized understanding. Patient's condition has the potential to improve in response to therapy. Maximum improvement is yet to be obtained. The anticipated improvement is attainable and reasonable in a generally predictable time. The pt will benefit from further skilled PT to improve strength, balance,  gait and mobility to decrease fall risk and increase QOL.   OBJECTIVE IMPAIRMENTS Abnormal gait, decreased activity tolerance, decreased balance, decreased coordination, decreased endurance, decreased mobility, difficulty walking, decreased ROM, decreased strength, postural dysfunction, and obesity.   ACTIVITY LIMITATIONS carrying, lifting, bending, sitting, standing, squatting, stairs, transfers, bed mobility, dressing, and locomotion level  PARTICIPATION LIMITATIONS: cleaning, laundry, interpersonal relationship, driving, and community activity  PERSONAL FACTORS Past/current experiences, Time since onset of injury/illness/exacerbation, and 3+ comorbidities: hx of brain tumor, past CVA, HTN, obesity, arthritis  are also affecting patient's functional outcome.   REHAB POTENTIAL: Good  CLINICAL DECISION MAKING: Stable/uncomplicated  EVALUATION COMPLEXITY: Low  PLAN: PT FREQUENCY: 2x/week  PT DURATION: 12 weeks  PLANNED INTERVENTIONS: Therapeutic exercises, Therapeutic activity, Neuromuscular re-education, Balance training, Gait training, Patient/Family education, Joint mobilization, Stair training, Vestibular training, Orthotic/Fit training, DME instructions, Dry Needling, Electrical stimulation, Wheelchair mobility training, Cryotherapy, Moist heat, Compression bandaging, Manual therapy, and Re-evaluation  PLAN FOR NEXT SESSION: strengthening, gait, endurance training, continue plan, complete goal reassessment  8:41 AM, 03/05/22 Ricard Dillon PT, DPT      Physical Therapist- [facility]  03/05/2022, 8:41 AM

## 2022-03-06 ENCOUNTER — Ambulatory Visit: Payer: Medicare Other

## 2022-03-06 DIAGNOSIS — R2681 Unsteadiness on feet: Secondary | ICD-10-CM | POA: Diagnosis not present

## 2022-03-06 DIAGNOSIS — R278 Other lack of coordination: Secondary | ICD-10-CM

## 2022-03-06 DIAGNOSIS — R262 Difficulty in walking, not elsewhere classified: Secondary | ICD-10-CM

## 2022-03-06 DIAGNOSIS — M6281 Muscle weakness (generalized): Secondary | ICD-10-CM

## 2022-03-06 NOTE — Therapy (Signed)
OUTPATIENT PHYSICAL THERAPY NEURO TREATMENT NOTE   Patient Name: Curtis Powell MRN: [id number] DOB:1965-11-07, 56 y.o., male Today's Date: 03/06/2022   PCP: Valera Castle, MD REFERRING PROVIDER: Valera Castle, MD   PT End of Session - 03/06/22 1305     Visit Number 101    Number of Visits 109    Date for PT Re-Evaluation 03/27/22    Authorization Type UHC Medicare; [place] Medicaid    Authorization Time Period 01/02/2022-03/27/2022    Progress Note Due on Visit 100    PT Start Time 1303    PT Stop Time 1344    PT Time Calculation (min) 41 min    Equipment Utilized During Treatment Gait belt    Activity Tolerance Patient tolerated treatment well;No increased pain    Behavior During Therapy WFL for tasks assessed/performed                  Past Medical History:  Diagnosis Date   Asthma    GERD (gastroesophageal reflux disease)    Hyperlipidemia    Hypertension    Obesity    Stroke [facility])    Past Surgical History:  Procedure Laterality Date   BRAIN SURGERY     Patient Active Problem List   Diagnosis Date Noted   Impaired glucose tolerance 03/23/2020   Learning disability 03/23/2020   Urinary and fecal incontinence 06/14/2019   Chronic pain syndrome 03/23/2019   Constipation 03/23/2019   Dysarthria 03/23/2019   Dysphagia 03/23/2019   Learning difficulty 03/23/2019   Morbid obesity ([place]) 03/23/2019   Muscle weakness 03/23/2019   Vitamin D deficiency 03/23/2019   Bilateral carotid artery stenosis 12/15/2018   Moderate aortic valve stenosis 12/15/2018   BMI 45.0-49.9, adult ([place]) 11/10/2018   Cerebrovascular accident ([place]) 05/02/2016   Hemiparesis affecting right side as late effect of cerebrovascular accident ([place]) 05/01/2016   Brain tumor ([place]) 11/24/2014   Gastro-esophageal reflux disease without esophagitis 11/24/2014   Accident due to mechanical fall without injury 08/03/2014   Essential hypertension 06/13/2014   Mixed hyperlipidemia  06/13/2014   Unspecified sequelae of cerebral infarction 06/13/2014   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/2014   Type 2 diabetes mellitus without complication ([place]) 48/25/0037   Neck mass 09/08/2013   Swimmer's ear 09/08/2013   Erectile dysfunction 01/03/2013   Prediabetes 01/03/2013    ONSET DATE: 05/09/2016  REFERRING DIAG: CVA (cerebral vascular accident) ([place])  THERAPY DIAG:  Difficulty in walking, not elsewhere classified  Other lack of coordination  Unsteadiness on feet  Muscle weakness (generalized)  Rationale for Evaluation and Treatment Rehabilitation  SUBJECTIVE:  SUBJECTIVE STATEMENT Pt reports no aches/pains currently. Reports no falls/stumbles. He is not feeling well due to his current brace not being on his LE.   Pt accompanied by: family member  PERTINENT HISTORY: History of CVA Feb 2018. He was previously fully independent, but this stroke caused significant deficits, mainly on the right side. He reports difficulty walking and transfers, uses a WC for most  His mom had to move in with him and now is his main caregiver. Patient had outpatient PT from November 2018-March 2022. Patient reports since being discharged from outpatient therapy he has kept up with his HEP daily. He does report desire for walking better and for getting in and out of bed easier. He reports his mom is helping with some ADLs. He reports needing help with rolling in bed. He reports his mom/aide assist with dressing. He has a home aide 3 days out of the week." PMH significant for hx of brain tumor, past CVA, and HTN. See chart for additional information.  PAIN:  Are you having pain? No  PRECAUTIONS: None  WEIGHT BEARING RESTRICTIONS No  PLOF: Independent  PATIENT GOALS: "to walk and sit-to-stand"   OBJECTIVE:    DIAGNOSTIC FINDINGS: None recent    TODAY'S TREATMENT:   TherEx Tested remaining therapy gait goal - pt ambulated 148 ft with RW and WC follow, CGA.  STS 3x10. Rates  medium  Seated thoracic ext over chair 3x10 - focus on improving upright posture in chair   PATIENT EDUCATION: Education details: Pt educated throughout session about proper posture and technique with exercises. Improved exercise technique, movement at target joints, use of target muscles after min to mod verbal, visual, tactile cues. Goal retesting   Person educated: Patient Education method: Explanation, Demonstration, Tactile cues, and Verbal cues Education comprehension: verbalized understanding, returned demonstration, and verbal cues required   [place] updates on this date, pt to continue as previously given  11/19/21:  Pt instructed on locations that may carry ankle weights for exercises to be performed at home.  Instructed to continue with daily walking.  GOALS: Goals reviewed with patient? No  SHORT TERM GOALS: Target date: 11/19/2021  Pt will be independent with HEP in order to improve strength and balance in order to decrease fall risk and improve function at home. Baseline: 8/28: pt reports doing HEP every day; 01/02/2022= Patient reports performing seated LE strengthening and some standing/walking at home. Goal status: GOAL MET   LONG TERM GOALS: Target date: 03/27/2022  Pt will increase FOTO to at least 57 in order to demonstrate improvement in function related to mobility and QoL. Baseline: 7/3: 50; 8/28: deferred to next session; 01/02/2022= 47%; 11/27: 40% Goal status: ONGOING  2.  Pt (< 17 yrs old) will complete 5xSTS test < 20 seconds indicating an increase in LE strength and improved balance. Baseline: 7/3: 55mn:01seconds, 8/28: 40.5 seconds; 01/02/2022= difficulty with standing today- patient not feeling well and will retest next visit; 10/16: 31 seconds; 11/27: 62  seconds Goal status: ONGOING  3.  Pt will increase 10MWT to > 0.5 m/s (30 sec) as to improve gait speed for better home ambulation and reduce fall risk. Baseline: 7/3: 0.11 m/s with bRW; 8/30: 0.14 m/s with RW and WC follow; 01/02/2022= 0.14 m/s with Bariatric RW; 11/27: 0.13 m/s with bariatric RW Goal status: ONGOING   4.  Pt will increase BLE gross strength to 4+/5 as to improve functional strenth for independent gait, increased standing tolerance, and increased ADL ability.  Baseline: 7/3: grossly 4-/5 (see strength section); 8/28: 4+/5  Goal status: MET  5.  Pt will ambulate 250' with LRAD and supervision with no rest breaks to allow for improved mobility within home and increased independence. Baseline: 7/3: 60.3'; 8/28: 97 ft; 01/02/2022= 75 feet with bariatric RW, CGA with close w/c follow- stopped today due to left hand pain and not feeling well. ; 11/27 deferred 11/29: 148 ft Goal status: ONGOING  ASSESSMENT:  CLINICAL IMPRESSION: Further retesting completed today. Pt does show substantial progress with gait goal completing 148 ft (previously 75 ft). This indicates improved gait ability/functional capacity. The pt will benefit from further skilled PT to improve strength, balance, gait and mobility to decrease fall risk and increase QOL.   OBJECTIVE IMPAIRMENTS Abnormal gait, decreased activity tolerance, decreased balance, decreased coordination, decreased endurance, decreased mobility, difficulty walking, decreased ROM, decreased strength, postural dysfunction, and obesity.   ACTIVITY LIMITATIONS carrying, lifting, bending, sitting, standing, squatting, stairs, transfers, bed mobility, dressing, and locomotion level  PARTICIPATION LIMITATIONS: cleaning, laundry, interpersonal relationship, driving, and community activity  PERSONAL FACTORS Past/current experiences, Time since onset of injury/illness/exacerbation, and 3+ comorbidities: hx of brain tumor, past CVA, HTN, obesity,  arthritis  are also affecting patient's functional outcome.   REHAB POTENTIAL: Good  CLINICAL DECISION MAKING: Stable/uncomplicated  EVALUATION COMPLEXITY: Low  PLAN: PT FREQUENCY: 2x/week  PT DURATION: 12 weeks  PLANNED INTERVENTIONS: Therapeutic exercises, Therapeutic activity, Neuromuscular re-education, Balance training, Gait training, Patient/Family education, Joint mobilization, Stair training, Vestibular training, Orthotic/Fit training, DME instructions, Dry Needling, Electrical stimulation, Wheelchair mobility training, Cryotherapy, Moist heat, Compression bandaging, Manual therapy, and Re-evaluation  PLAN FOR NEXT SESSION: strengthening, gait, endurance training, continue plan  5:16 PM, 03/06/22 Ricard Dillon PT, DPT      Physical Therapist- [facility]  03/06/2022, 5:16 PM

## 2022-03-11 ENCOUNTER — Ambulatory Visit: Payer: Medicare Other | Attending: Family Medicine

## 2022-03-11 DIAGNOSIS — R262 Difficulty in walking, not elsewhere classified: Secondary | ICD-10-CM | POA: Diagnosis present

## 2022-03-11 DIAGNOSIS — M6281 Muscle weakness (generalized): Secondary | ICD-10-CM | POA: Diagnosis present

## 2022-03-11 DIAGNOSIS — R278 Other lack of coordination: Secondary | ICD-10-CM | POA: Insufficient documentation

## 2022-03-11 DIAGNOSIS — R531 Weakness: Secondary | ICD-10-CM | POA: Diagnosis present

## 2022-03-11 DIAGNOSIS — R2681 Unsteadiness on feet: Secondary | ICD-10-CM | POA: Insufficient documentation

## 2022-03-11 NOTE — Therapy (Signed)
OUTPATIENT PHYSICAL THERAPY NEURO TREATMENT NOTE   Patient Name: Winifred L Ou MRN: [id number] DOB:08/02/1965, 56 y.o., male Today's Date: 03/11/2022   PCP: Olmedo, Mario Ernesto, MD REFERRING PROVIDER: Olmedo, Mario Ernesto, MD   PT End of Session - 03/11/22 1301     Visit Number 102    Number of Visits 109    Date for PT Re-Evaluation 03/27/22    Authorization Type [employer] Medicare; [place] Medicaid    Authorization Time Period 01/02/2022-03/27/2022    Progress Note Due on Visit 100    PT Start Time 1302    PT Stop Time 1345    PT Time Calculation (min) 43 min    Equipment Utilized During Treatment Gait belt    Activity Tolerance Patient tolerated treatment well;No increased pain    Behavior During Therapy WFL for tasks assessed/performed                  Past Medical History:  Diagnosis Date   Asthma    GERD (gastroesophageal reflux disease)    Hyperlipidemia    Hypertension    Obesity    Stroke (HCC)    Past Surgical History:  Procedure Laterality Date   BRAIN SURGERY     Patient Active Problem List   Diagnosis Date Noted   Impaired glucose tolerance 03/23/2020   Learning disability 03/23/2020   Urinary and fecal incontinence 06/14/2019   Chronic pain syndrome 03/23/2019   Constipation 03/23/2019   Dysarthria 03/23/2019   Dysphagia 03/23/2019   Learning difficulty 03/23/2019   Morbid obesity (HCC) 03/23/2019   Muscle weakness 03/23/2019   Vitamin D deficiency 03/23/2019   Bilateral carotid artery stenosis 12/15/2018   Moderate aortic valve stenosis 12/15/2018   BMI 45.0-49.9, adult (HCC) 11/10/2018   Cerebrovascular accident (HCC) 05/02/2016   Hemiparesis affecting right side as late effect of cerebrovascular accident (HCC) 05/01/2016   Brain tumor (HCC) 11/24/2014   Gastro-esophageal reflux disease without esophagitis 11/24/2014   Accident due to mechanical fall without injury 08/03/2014   Essential hypertension 06/13/2014   Mixed hyperlipidemia  06/13/2014   Unspecified sequelae of cerebral infarction 06/13/2014   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/2014   Type 2 diabetes mellitus without complication (HCC) 01/06/2014   Neck mass 09/08/2013   Swimmer's ear 09/08/2013   Erectile dysfunction 01/03/2013   Prediabetes 01/03/2013    ONSET DATE: 05/09/2016  REFERRING DIAG: CVA (cerebral vascular accident) (HCC)  THERAPY DIAG:  Difficulty in walking, not elsewhere classified  Muscle weakness (generalized)  Unsteadiness on feet  Other lack of coordination  Rationale for Evaluation and Treatment Rehabilitation  SUBJECTIVE:                                                                                                                                                                                                SUBJECTIVE STATEMENT Pt reports he has upcoming doctor's appointment. Pt reports no stumbles/falls, no pain currently. Pt reports doing his HEP. Pt also has upcoming appointment with [facility] for R AFO, but needs form for LLE.  Pt accompanied by: family member  PERTINENT HISTORY: History of CVA Feb 2018. He was previously fully independent, but this stroke caused significant deficits, mainly on the right side. He reports difficulty walking and transfers, uses a WC for most  His mom had to move in with him and now is his main caregiver. Patient had outpatient PT from November 2018-March 2022. Patient reports since being discharged from outpatient therapy he has kept up with his HEP daily. He does report desire for walking better and for getting in and out of bed easier. He reports his mom is helping with some ADLs. He reports needing help with rolling in bed. He reports his mom/aide assist with dressing. He has a home aide 3 days out of the week." PMH significant for hx of brain tumor, past CVA, and HTN. See chart for additional information.  PAIN:  Are you having pain? No  PRECAUTIONS: None  WEIGHT BEARING RESTRICTIONS No  PLOF:  Independent  PATIENT GOALS: "to walk and sit-to-stand"   OBJECTIVE:   DIAGNOSTIC FINDINGS: None recent    TODAY'S TREATMENT:  TherEx -  Pt ambulated for endurance and functional capacity - 148 ft with RW and WC follow, CGA and 3# ankle weights donned each LE. Pt rates hard.  Seated thoracic ext over chair 10x - focus on improving upright posture in chair  Chair dips 10x2 sets   NMR-  Standing at RW without UE support x 60 sec   -progressed to performing standing with upright posture and head turns (vertical and horizontal)  10x for each. PT provides up to min assist and pt initially started with UUE support before attempting without UE support  Standing at RW LTL weight shifts 15x   Standing at RW FWD/BCKWD weight shifts in each LE position 15x for each LE  PATIENT EDUCATION: Education details: Pt educated throughout session about proper posture and technique with exercises. Improved exercise technique, movement at target joints, use of target muscles after min to mod verbal, visual, tactile cues.   Person educated: Patient Education method: Explanation, Demonstration, Tactile cues, and Verbal cues Education comprehension: verbalized understanding, returned demonstration, and verbal cues required   [place] updates on this date, pt to continue as previously given  11/19/21:  Pt instructed on locations that may carry ankle weights for exercises to be performed at home.  Instructed to continue with daily walking.  GOALS: Goals reviewed with patient? No  SHORT TERM GOALS: Target date: 11/19/2021  Pt will be independent with HEP in order to improve strength and balance in order to decrease fall risk and improve function at home. Baseline: 8/28: pt reports doing HEP every day; 01/02/2022= Patient reports performing seated LE strengthening and some standing/walking at home. Goal status: GOAL MET   LONG TERM GOALS: Target date: 03/27/2022  Pt will increase  FOTO to at least 57 in order to demonstrate improvement in function related to mobility and QoL. Baseline: 7/3: 50; 8/28: deferred to next session; 01/02/2022= 47%; 11/27: 40% Goal status: ONGOING  2.  Pt (< 64 yrs old) will complete 5xSTS test < 20 seconds indicating an increase in LE strength and improved balance. Baseline: 7/3: 30mn:01seconds, 8/28: 40.5 seconds; 01/02/2022= difficulty with standing today- patient not feeling well and will retest next  visit; 10/16: 31 seconds; 11/27: 62 seconds Goal status: ONGOING  3.  Pt will increase 10MWT to > 0.5 m/s (30 sec) as to improve gait speed for better home ambulation and reduce fall risk. Baseline: 7/3: 0.11 m/s with bRW; 8/30: 0.14 m/s with RW and WC follow; 01/02/2022= 0.14 m/s with Bariatric RW; 11/27: 0.13 m/s with bariatric RW Goal status: ONGOING   4.  Pt will increase BLE gross strength to 4+/5 as to improve functional strenth for independent gait, increased standing tolerance, and increased ADL ability. Baseline: 7/3: grossly 4-/5 (see strength section); 8/28: 4+/5  Goal status: MET  5.  Pt will ambulate 250' with LRAD and supervision with no rest breaks to allow for improved mobility within home and increased independence. Baseline: 7/3: 60.3'; 8/28: 97 ft; 01/02/2022= 75 feet with bariatric RW, CGA with close w/c follow- stopped today due to left hand pain and not feeling well. ; 11/27 deferred 11/29: 148 ft Goal status: ONGOING  ASSESSMENT:  CLINICAL IMPRESSION: Pt able to ambulated 148 ft with 3# weights donned exhibiting improved step-length B with initial minutes of set. While pt shows improvement, he struggle with all standing balance activities requiring up to min assist and UE support. The pt will benefit from further skilled PT to improve strength, balance, gait and mobility to decrease fall risk and increase QOL.   OBJECTIVE IMPAIRMENTS Abnormal gait, decreased activity tolerance, decreased balance, decreased coordination,  decreased endurance, decreased mobility, difficulty walking, decreased ROM, decreased strength, postural dysfunction, and obesity.   ACTIVITY LIMITATIONS carrying, lifting, bending, sitting, standing, squatting, stairs, transfers, bed mobility, dressing, and locomotion level  PARTICIPATION LIMITATIONS: cleaning, laundry, interpersonal relationship, driving, and community activity  PERSONAL FACTORS Past/current experiences, Time since onset of injury/illness/exacerbation, and 3+ comorbidities: hx of brain tumor, past CVA, HTN, obesity, arthritis  are also affecting patient's functional outcome.   REHAB POTENTIAL: Good  CLINICAL DECISION MAKING: Stable/uncomplicated  EVALUATION COMPLEXITY: Low  PLAN: PT FREQUENCY: 2x/week  PT DURATION: 12 weeks  PLANNED INTERVENTIONS: Therapeutic exercises, Therapeutic activity, Neuromuscular re-education, Balance training, Gait training, Patient/Family education, Joint mobilization, Stair training, Vestibular training, Orthotic/Fit training, DME instructions, Dry Needling, Electrical stimulation, Wheelchair mobility training, Cryotherapy, Moist heat, Compression bandaging, Manual therapy, and Re-evaluation  PLAN FOR NEXT SESSION: strengthening, gait, endurance training, continue plan  3:03 PM, 03/11/22 Ricard Dillon PT, DPT      Physical Therapist- [facility]  03/11/2022, 3:03 PM

## 2022-03-13 ENCOUNTER — Ambulatory Visit: Payer: Medicare Other

## 2022-03-13 DIAGNOSIS — R262 Difficulty in walking, not elsewhere classified: Secondary | ICD-10-CM

## 2022-03-13 DIAGNOSIS — M6281 Muscle weakness (generalized): Secondary | ICD-10-CM

## 2022-03-13 DIAGNOSIS — R278 Other lack of coordination: Secondary | ICD-10-CM

## 2022-03-13 DIAGNOSIS — R2681 Unsteadiness on feet: Secondary | ICD-10-CM

## 2022-03-13 NOTE — Therapy (Signed)
OUTPATIENT PHYSICAL THERAPY NEURO TREATMENT NOTE   Patient Name: Curtis Powell MRN: [id number] DOB:1965-05-08, 56 y.o., male Today's Date: 03/13/2022   PCP: Valera Castle, MD REFERRING PROVIDER: Valera Castle, MD   PT End of Session - 03/13/22 1301     Visit Number 103    Number of Visits 109    Date for PT Re-Evaluation 03/27/22    Authorization Time Period 01/02/2022-03/27/2022    Progress Note Due on Visit 100    PT Start Time 1302    PT Stop Time 1343    PT Time Calculation (min) 41 min    Equipment Utilized During Treatment Gait belt    Activity Tolerance Patient tolerated treatment well;No increased pain    Behavior During Therapy WFL for tasks assessed/performed                  Past Medical History:  Diagnosis Date   Asthma    GERD (gastroesophageal reflux disease)    Hyperlipidemia    Hypertension    Obesity    Stroke [facility])    Past Surgical History:  Procedure Laterality Date   BRAIN SURGERY     Patient Active Problem List   Diagnosis Date Noted   Impaired glucose tolerance 03/23/2020   Learning disability 03/23/2020   Urinary and fecal incontinence 06/14/2019   Chronic pain syndrome 03/23/2019   Constipation 03/23/2019   Dysarthria 03/23/2019   Dysphagia 03/23/2019   Learning difficulty 03/23/2019   Morbid obesity ([place]) 03/23/2019   Muscle weakness 03/23/2019   Vitamin D deficiency 03/23/2019   Bilateral carotid artery stenosis 12/15/2018   Moderate aortic valve stenosis 12/15/2018   BMI 45.0-49.9, adult ([place]) 11/10/2018   Cerebrovascular accident ([place]) 05/02/2016   Hemiparesis affecting right side as late effect of cerebrovascular accident ([place]) 05/01/2016   Brain tumor ([place]) 11/24/2014   Gastro-esophageal reflux disease without esophagitis 11/24/2014   Accident due to mechanical fall without injury 08/03/2014   Essential hypertension 06/13/2014   Mixed hyperlipidemia 06/13/2014   Unspecified sequelae of cerebral  infarction 06/13/2014   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/2014   Type 2 diabetes mellitus without complication ([place]) 47/10/6149   Neck mass 09/08/2013   Swimmer's ear 09/08/2013   Erectile dysfunction 01/03/2013   Prediabetes 01/03/2013    ONSET DATE: 05/09/2016  REFERRING DIAG: CVA (cerebral vascular accident) ([place])  THERAPY DIAG:  Muscle weakness (generalized)  Difficulty in walking, not elsewhere classified  Unsteadiness on feet  Other lack of coordination  Rationale for Evaluation and Treatment Rehabilitation  SUBJECTIVE:  SUBJECTIVE STATEMENT Pt reports no updates since last session. Caregiver present and reports pt is not doing exercises at home.  Pt accompanied by: family member  PERTINENT HISTORY: History of CVA Feb 2018. He was previously fully independent, but this stroke caused significant deficits, mainly on the right side. He reports difficulty walking and transfers, uses a WC for most  His mom had to move in with him and now is his main caregiver. Patient had outpatient PT from November 2018-March 2022. Patient reports since being discharged from outpatient therapy he has kept up with his HEP daily. He does report desire for walking better and for getting in and out of bed easier. He reports his mom is helping with some ADLs. He reports needing help with rolling in bed. He reports his mom/aide assist with dressing. He has a home aide 3 days out of the week." PMH significant for hx of brain tumor, past CVA, and HTN. See chart for additional information.  PAIN:  Are you having pain? No  PRECAUTIONS: None  WEIGHT BEARING RESTRICTIONS No  PLOF: Independent  PATIENT GOALS: "to walk and sit-to-stand"   OBJECTIVE:   DIAGNOSTIC FINDINGS: None recent    TODAY'S TREATMENT:  TherEx -  Pt  ambulated for endurance and functional capacity - 172 ft with RW and WC follow, CGA and 4# ankle weights donned each LE. When going to sit back into chair pt started leaning heavily to R side with absence of reactive balance strategies and required mod assist +2 to help pt descend into chair to prevent fall to floor. PT monitored pt while he rested and asked if pt felt lightheaded or SOB or if he had any other symptoms, which pt denied. Pt reported improvement in fatigue with rest.  Seated thoracic ext over chair 10x2 sets - focus on improving upright posture in chair  Seated march 2x20 alt LE. Pt rates medium   Chair dips 2x12. Pt rates medium  NMR-  Standing endurance at RW 2x10 sec, 2x60 sec - CGA-min assist provided  Instruction in safe stand>sit technique from RW to [facility] (pt tendency to fully let go of RW before reaching for chair with poor eccentric control into chair) x 5 reps. Pt shows significant improvement in technique and controlled descent into chair following cues.  PATIENT EDUCATION: Education details: Pt educated throughout session about proper posture and technique with exercises. Improved exercise technique, movement at target joints, use of target muscles after min to mod verbal, visual, tactile cues.   Person educated: Patient Education method: Explanation, Demonstration, Tactile cues, and Verbal cues Education comprehension: verbalized understanding, returned demonstration, and verbal cues required   [place] updates on this date, pt to continue as previously given  11/19/21:  Pt instructed on locations that may carry ankle weights for exercises to be performed at home.  Instructed to continue with daily walking.  GOALS: Goals reviewed with patient? No  SHORT TERM GOALS: Target date: 11/19/2021  Pt will be independent with HEP in order to improve strength and balance in order to decrease fall risk and improve function at home. Baseline: 8/28: pt  reports doing HEP every day; 01/02/2022= Patient reports performing seated LE strengthening and some standing/walking at home. Goal status: GOAL MET   LONG TERM GOALS: Target date: 03/27/2022  Pt will increase FOTO to at least 57 in order to demonstrate improvement in function related to mobility and QoL. Baseline: 7/3: 50; 8/28: deferred to next session; 01/02/2022= 47%; 11/27: 40% Goal  status: ONGOING  2.  Pt (< 84 yrs old) will complete 5xSTS test < 20 seconds indicating an increase in LE strength and improved balance. Baseline: 7/3: 77mn:01seconds, 8/28: 40.5 seconds; 01/02/2022= difficulty with standing today- patient not feeling well and will retest next visit; 10/16: 31 seconds; 11/27: 62 seconds Goal status: ONGOING  3.  Pt will increase 10MWT to > 0.5 m/s (30 sec) as to improve gait speed for better home ambulation and reduce fall risk. Baseline: 7/3: 0.11 m/s with bRW; 8/30: 0.14 m/s with RW and WC follow; 01/02/2022= 0.14 m/s with Bariatric RW; 11/27: 0.13 m/s with bariatric RW Goal status: ONGOING   4.  Pt will increase BLE gross strength to 4+/5 as to improve functional strenth for independent gait, increased standing tolerance, and increased ADL ability. Baseline: 7/3: grossly 4-/5 (see strength section); 8/28: 4+/5  Goal status: MET  5.  Pt will ambulate 250' with LRAD and supervision with no rest breaks to allow for improved mobility within home and increased independence. Baseline: 7/3: 60.3'; 8/28: 97 ft; 01/02/2022= 75 feet with bariatric RW, CGA with close w/c follow- stopped today due to left hand pain and not feeling well. ; 11/27 deferred 11/29: 148 ft Goal status: ONGOING  ASSESSMENT:  CLINICAL IMPRESSION: Pt continues to ambulate greater distances with WC follow, RW and increased weights on BLE compared to previous session. When pt cued to sit into WC at end of intervention, pt started leaning heavily to R side/losing balance with absent reactive strategies to regain  balance even with VC/TC. Pt required mod a +2 to sit safely into chair to prevent fall to R side. Pt reported no symptoms preceding and following fall aside from feeling fatigued. Further assessment of reactive and anticipatory postural control would benefit pt. The pt will benefit from further skilled PT to improve strength, balance, gait and mobility to decrease fall risk and increase QOL.   OBJECTIVE IMPAIRMENTS Abnormal gait, decreased activity tolerance, decreased balance, decreased coordination, decreased endurance, decreased mobility, difficulty walking, decreased ROM, decreased strength, postural dysfunction, and obesity.   ACTIVITY LIMITATIONS carrying, lifting, bending, sitting, standing, squatting, stairs, transfers, bed mobility, dressing, and locomotion level  PARTICIPATION LIMITATIONS: cleaning, laundry, interpersonal relationship, driving, and community activity  PERSONAL FACTORS Past/current experiences, Time since onset of injury/illness/exacerbation, and 3+ comorbidities: hx of brain tumor, past CVA, HTN, obesity, arthritis  are also affecting patient's functional outcome.   REHAB POTENTIAL: Good  CLINICAL DECISION MAKING: Stable/uncomplicated  EVALUATION COMPLEXITY: Low  PLAN: PT FREQUENCY: 2x/week  PT DURATION: 12 weeks  PLANNED INTERVENTIONS: Therapeutic exercises, Therapeutic activity, Neuromuscular re-education, Balance training, Gait training, Patient/Family education, Joint mobilization, Stair training, Vestibular training, Orthotic/Fit training, DME instructions, Dry Needling, Electrical stimulation, Wheelchair mobility training, Cryotherapy, Moist heat, Compression bandaging, Manual therapy, and Re-evaluation  PLAN FOR NEXT SESSION: strengthening, gait, endurance training, continue plan  4:43 PM, 03/13/22 HRicard DillonPT, DPT      [facility] 03/13/2022, 4:43 PM

## 2022-03-18 ENCOUNTER — Ambulatory Visit: Payer: Medicare Other

## 2022-03-18 DIAGNOSIS — R262 Difficulty in walking, not elsewhere classified: Secondary | ICD-10-CM | POA: Diagnosis not present

## 2022-03-18 DIAGNOSIS — R2681 Unsteadiness on feet: Secondary | ICD-10-CM

## 2022-03-18 DIAGNOSIS — R278 Other lack of coordination: Secondary | ICD-10-CM

## 2022-03-18 DIAGNOSIS — R531 Weakness: Secondary | ICD-10-CM

## 2022-03-18 DIAGNOSIS — M6281 Muscle weakness (generalized): Secondary | ICD-10-CM

## 2022-03-18 NOTE — Therapy (Signed)
OUTPATIENT PHYSICAL THERAPY NEURO TREATMENT NOTE   Patient Name: Curtis Powell MRN: [id number] DOB:[birth year], 56 y.o., male Today's Date: 03/18/2022  PCP: Valera Castle, MD REFERRING PROVIDER: Valera Castle, MD  PT End of Session - 03/18/22 1409     Visit Number 104    Number of Visits 109    Date for PT Re-Evaluation 03/27/22    Authorization Type UHC Medicare; [place] Medicaid    Authorization Time Period 01/02/2022-03/27/2022    Progress Note Due on Visit 100    PT Start Time 1300    PT Stop Time 1345    PT Time Calculation (min) 45 min    Equipment Utilized During Treatment Gait belt    Activity Tolerance Patient tolerated treatment well;No increased pain    Behavior During Therapy WFL for tasks assessed/performed            Past Medical History:  Diagnosis Date   Asthma    GERD (gastroesophageal reflux disease)    Hyperlipidemia    Hypertension    Obesity    Stroke [facility])    Past Surgical History:  Procedure Laterality Date   BRAIN SURGERY     Patient Active Problem List   Diagnosis Date Noted   Impaired glucose tolerance 03/23/2020   Learning disability 03/23/2020   Urinary and fecal incontinence 06/14/2019   Chronic pain syndrome 03/23/2019   Constipation 03/23/2019   Dysarthria 03/23/2019   Dysphagia 03/23/2019   Learning difficulty 03/23/2019   Morbid obesity ([place]) 03/23/2019   Muscle weakness 03/23/2019   Vitamin D deficiency 03/23/2019   Bilateral carotid artery stenosis 12/15/2018   Moderate aortic valve stenosis 12/15/2018   BMI 45.0-49.9, adult ([place]) 11/10/2018   Cerebrovascular accident ([place]) 05/02/2016   Hemiparesis affecting right side as late effect of cerebrovascular accident ([place]) 05/01/2016   Brain tumor ([place]) 11/24/2014   Gastro-esophageal reflux disease without esophagitis 11/24/2014   Accident due to mechanical fall without injury 08/03/2014   Essential hypertension 06/13/2014   Mixed hyperlipidemia 06/13/2014    Unspecified sequelae of cerebral infarction 06/13/2014   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/2014   Type 2 diabetes mellitus without complication ([place]) 64/40/3474   Neck mass 09/08/2013   Swimmer's ear 09/08/2013   Erectile dysfunction 01/03/2013   Prediabetes 01/03/2013   ONSET DATE: 05/09/2016  REFERRING DIAG: CVA (cerebral vascular accident) ([name])  THERAPY DIAG:  Muscle weakness (generalized)  Difficulty in walking, not elsewhere classified  Left-sided weakness  Other lack of coordination  Unsteadiness on feet  Rationale for Evaluation and Treatment Rehabilitation  SUBJECTIVE:  SUBJECTIVE STATEMENT Pt reports no falls or significant changes since last session. Patient reports they did their HEP this week.  Pt accompanied by: family member  PERTINENT HISTORY: History of CVA Feb 2018. He was previously fully independent, but this stroke caused significant deficits, mainly on the right side. He reports difficulty walking and transfers, uses a WC for most  His mom had to move in with him and now is his main caregiver. Patient had outpatient PT from November 2018-March 2022. Patient reports since being discharged from outpatient therapy he has kept up with his HEP daily. He does report desire for walking better and for getting in and out of bed easier. He reports his mom is helping with some ADLs. He reports needing help with rolling in bed. He reports his mom/aide assist with dressing. He has a home aide 3 days out of the week." PMH significant for hx of brain tumor, past CVA, and HTN. See chart for additional information.  PAIN:  Are you having pain? No  PRECAUTIONS: None  WEIGHT BEARING RESTRICTIONS No  PLOF: Independent  PATIENT GOALS: "to walk and sit-to-stand"   OBJECTIVE:   DIAGNOSTIC FINDINGS:  None recent   TODAY'S TREATMENT:  Resting Vitals BP: 114/66 mmHg HR: 92 bpm   There Ex:   - Pt ambulated for endurance and functional capacity 70 ft with RW and WC follow, CGA and 4# ankle weights donned each LE. PT monitored patient while he rested and asked if pt felt lightheaded or SOB or if he had any other symptoms, which pt denied.  - Seated thoracic ext over chair 10x2 sets - focus on improving upright posture in chair  - Seated march 1x10 alt LE  - Seated march with AW 4 lbs 2x10  - Seated LAQ with 4 lb AW 2x10   Neuro Re - ed:  - Standing Speed Bag 1x30 sec (5 hits ea hand) - Standing Speed Bag 1x30 sec (10 hits ea hand) - Standing Speed Bag 1x30 sec (10x hits alternating hands)  *HR: 144 bpm following speed bag progressions*  - Standing at RW with UE support 2x30 sec  - Lateral weight shifts at RW with UE support 2x30 sec   *Patient required verbal cues when performing STS to activate and squeeze their glutes to generate power to stand upright*   PATIENT EDUCATION: Education details: Pt educated throughout session about proper posture and technique with exercises. Improved exercise technique, movement at target joints, use of target muscles after min to mod verbal, visual, tactile cues.   Person educated: Patient Education method: Explanation, Demonstration, Tactile cues, and Verbal cues Education comprehension: verbalized understanding, returned demonstration, and verbal cues required  [place] updates on this date, pt to continue as previously given  11/19/21:  Pt instructed on locations that may carry ankle weights for exercises to be performed at home.  Instructed to continue with daily walking.  GOALS: Goals reviewed with patient? No  SHORT TERM GOALS: Target date: 11/19/2021  Pt will be independent with HEP in order to improve strength and balance in order to decrease fall risk and improve function at home. Baseline: 8/28: pt reports doing  HEP every day; 01/02/2022= Patient reports performing seated LE strengthening and some standing/walking at home. Goal status: GOAL MET   LONG TERM GOALS: Target date: 03/27/2022  Pt will increase FOTO to at least 57 in order to demonstrate improvement in function related to mobility and QoL. Baseline: 7/3: 50; 8/28: deferred to next session;  01/02/2022= 47%; 11/27: 40% Goal status: ONGOING  2.  Pt (< 77 yrs old) will complete 5xSTS test < 20 seconds indicating an increase in LE strength and improved balance. Baseline: 7/3: 13mn:01seconds, 8/28: 40.5 seconds; 01/02/2022= difficulty with standing today- patient not feeling well and will retest next visit; 10/16: 31 seconds; 11/27: 62 seconds Goal status: ONGOING  3.  Pt will increase 10MWT to > 0.5 m/s (30 sec) as to improve gait speed for better home ambulation and reduce fall risk. Baseline: 7/3: 0.11 m/s with bRW; 8/30: 0.14 m/s with RW and WC follow; 01/02/2022= 0.14 m/s with Bariatric RW; 11/27: 0.13 m/s with bariatric RW Goal status: ONGOING   4.  Pt will increase BLE gross strength to 4+/5 as to improve functional strenth for independent gait, increased standing tolerance, and increased ADL ability. Baseline: 7/3: grossly 4-/5 (see strength section); 8/28: 4+/5  Goal status: MET  5.  Pt will ambulate 250' with LRAD and supervision with no rest breaks to allow for improved mobility within home and increased independence. Baseline: 7/3: 60.3'; 8/28: 97 ft; 01/02/2022= 75 feet with bariatric RW, CGA with close w/c follow- stopped today due to left hand pain and not feeling well. ; 11/27 deferred 11/29: 148 ft Goal status: ONGOING  ASSESSMENT:  CLINICAL IMPRESSION: Patient continues to perform well with ambulating with a WC follow, RW and 4 lb ankle weights on BLE. The patient required verbal cues to activate and squeeze their glutes to generate power to stand upright when performing STS. The patient had moments when they shifted too much  weight forward during their STS and had to sit down before attempting the STS again. The patient was able to demonstrate increased standing tolerance today and was able to complete 3 trials of standing speed bag boxing rounds. Further assessment of reactive and anticipatory postural control would benefit patient moving forward. The pt will benefit from further skilled PT to improve strength, balance, gait and mobility to decrease fall risk and increase QOL.   OBJECTIVE IMPAIRMENTS Abnormal gait, decreased activity tolerance, decreased balance, decreased coordination, decreased endurance, decreased mobility, difficulty walking, decreased ROM, decreased strength, postural dysfunction, and obesity.   ACTIVITY LIMITATIONS carrying, lifting, bending, sitting, standing, squatting, stairs, transfers, bed mobility, dressing, and locomotion level  PARTICIPATION LIMITATIONS: cleaning, laundry, interpersonal relationship, driving, and community activity  PERSONAL FACTORS Past/current experiences, Time since onset of injury/illness/exacerbation, and 3+ comorbidities: hx of brain tumor, past CVA, HTN, obesity, arthritis  are also affecting patient's functional outcome.   REHAB POTENTIAL: Good  CLINICAL DECISION MAKING: Stable/uncomplicated  EVALUATION COMPLEXITY: Low  PLAN: PT FREQUENCY: 2x/week  PT DURATION: 12 weeks  PLANNED INTERVENTIONS: Therapeutic exercises, Therapeutic activity, Neuromuscular re-education, Balance training, Gait training, Patient/Family education, Joint mobilization, Stair training, Vestibular training, Orthotic/Fit training, DME instructions, Dry Needling, Electrical stimulation, Wheelchair mobility training, Cryotherapy, Moist heat, Compression bandaging, Manual therapy, and Re-evaluation  PLAN FOR NEXT SESSION: strengthening, gait, endurance training, continue plan  2:11 PM, 03/18/22 PSudie BaileySPT   This entire session was performed under direct supervision and  direction of a licensed therapist/therapist assistant . I have personally read, edited and approve of the note as written.  MJanna Arch PT, DPT   [facility] 03/18/2022, 2:11 PM

## 2022-03-20 ENCOUNTER — Ambulatory Visit: Payer: Medicare Other

## 2022-03-20 DIAGNOSIS — R278 Other lack of coordination: Secondary | ICD-10-CM

## 2022-03-20 DIAGNOSIS — M6281 Muscle weakness (generalized): Secondary | ICD-10-CM

## 2022-03-20 DIAGNOSIS — R262 Difficulty in walking, not elsewhere classified: Secondary | ICD-10-CM | POA: Diagnosis not present

## 2022-03-20 DIAGNOSIS — R2681 Unsteadiness on feet: Secondary | ICD-10-CM

## 2022-03-20 NOTE — Therapy (Signed)
OUTPATIENT PHYSICAL THERAPY NEURO TREATMENT NOTE   Patient Name: Curtis Powell MRN: [id number] DOB:1965/08/09, 56 y.o., male Today's Date: 03/20/2022  PCP: Valera Castle, MD REFERRING PROVIDER: Valera Castle, MD  PT End of Session - 03/20/22 1306     Visit Number 105    Number of Visits 109    Date for PT Re-Evaluation 03/27/22    Authorization Type UHC Medicare; [place] Medicaid    Authorization Time Period 01/02/2022-03/27/2022    Progress Note Due on Visit 100    PT Start Time 1303    PT Stop Time 1345    PT Time Calculation (min) 42 min    Equipment Utilized During Treatment Gait belt    Activity Tolerance Patient tolerated treatment well;No increased pain    Behavior During Therapy WFL for tasks assessed/performed             Past Medical History:  Diagnosis Date   Asthma    GERD (gastroesophageal reflux disease)    Hyperlipidemia    Hypertension    Obesity    Stroke [facility])    Past Surgical History:  Procedure Laterality Date   BRAIN SURGERY     Patient Active Problem List   Diagnosis Date Noted   Impaired glucose tolerance 03/23/2020   Learning disability 03/23/2020   Urinary and fecal incontinence 06/14/2019   Chronic pain syndrome 03/23/2019   Constipation 03/23/2019   Dysarthria 03/23/2019   Dysphagia 03/23/2019   Learning difficulty 03/23/2019   Morbid obesity ([place]) 03/23/2019   Muscle weakness 03/23/2019   Vitamin D deficiency 03/23/2019   Bilateral carotid artery stenosis 12/15/2018   Moderate aortic valve stenosis 12/15/2018   BMI 45.0-49.9, adult ([place]) 11/10/2018   Cerebrovascular accident ([place]) 05/02/2016   Hemiparesis affecting right side as late effect of cerebrovascular accident ([place]) 05/01/2016   Brain tumor ([place]) 11/24/2014   Gastro-esophageal reflux disease without esophagitis 11/24/2014   Accident due to mechanical fall without injury 08/03/2014   Essential hypertension 06/13/2014   Mixed hyperlipidemia 06/13/2014    Unspecified sequelae of cerebral infarction 06/13/2014   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/2014   Type 2 diabetes mellitus without complication ([place]) 00/45/9977   Neck mass 09/08/2013   Swimmer's ear 09/08/2013   Erectile dysfunction 01/03/2013   Prediabetes 01/03/2013   ONSET DATE: 05/09/2016  REFERRING DIAG: CVA (cerebral vascular accident) ([place])  THERAPY DIAG:  Muscle weakness (generalized)  Unsteadiness on feet  Other lack of coordination  Rationale for Evaluation and Treatment Rehabilitation  SUBJECTIVE:  SUBJECTIVE STATEMENT Pt worked on walking and standing yesterday. Pt has also been to orthotist and fitted for new BLE AFOs. Pt reports no pain, no stumbles/falls, no other concerns.   Pt accompanied by: family member  PERTINENT HISTORY: History of CVA Feb 2018. He was previously fully independent, but this stroke caused significant deficits, mainly on the right side. He reports difficulty walking and transfers, uses a WC for most  His mom had to move in with him and now is his main caregiver. Patient had outpatient PT from November 2018-March 2022. Patient reports since being discharged from outpatient therapy he has kept up with his HEP daily. He does report desire for walking better and for getting in and out of bed easier. He reports his mom is helping with some ADLs. He reports needing help with rolling in bed. He reports his mom/aide assist with dressing. He has a home aide 3 days out of the week." PMH significant for hx of brain tumor, past CVA, and HTN. See chart for additional information.  PAIN:  Are you having pain? No  PRECAUTIONS: None  WEIGHT BEARING RESTRICTIONS No  PLOF: Independent  PATIENT GOALS: "to walk and sit-to-stand"   OBJECTIVE:   DIAGNOSTIC FINDINGS: None recent   TODAY'S  TREATMENT:    There Ex:   - Seated march 2x12 each LE  - Seated LAQ 20x each LE  Seated soccer ball kicks to promote LE power of quads and timing/sequencing - performed without AW and with 4# donned each LE x multiple reps. Pt with sequencing errors with RLE.   - Seated thoracic ext over chair 10x2 sets - focus on improving upright posture in chair  -STS 2x6 - improved ability to extend hips/activate glutes for improved anticipatory postural control. Requires less cuing  NRM:  Static stand at BRW 30 sec, no UE support  Static stand at BRW with UUE support with vertical and horizontal head turns 10x for each- requires up to mod assist to correct for LOB  Static stand, no UE support, stacking and un-stacking cone activity at BRW- cuing for awareness of posterior weigh-shift to promote  anticipatory postural control. Pt reports he does not always feel when he is going backwards. Requires up to mod assist to correct  Static stand at BRW - cone resorting and shuffling activity without UE support to incorporate balance with twisting. Pt provides further cuing for awareness of weight-shift/LOB and instruction in how to correct  PATIENT EDUCATION: Education details: Pt educated throughout session about proper posture and technique with exercises. Improved exercise technique, movement at target joints, use of target muscles after min to mod verbal, visual, tactile cues.   Person educated: Patient Education method: Explanation, Demonstration, Tactile cues, and Verbal cues Education comprehension: verbalized understanding, returned demonstration, and verbal cues required  [place] updates on this date, pt to continue as previously given  11/19/21:  Pt instructed on locations that may carry ankle weights for exercises to be performed at home.  Instructed to continue with daily walking.  GOALS: Goals reviewed with patient? No  SHORT TERM GOALS: Target date: 11/19/2021  Pt  will be independent with HEP in order to improve strength and balance in order to decrease fall risk and improve function at home. Baseline: 8/28: pt reports doing HEP every day; 01/02/2022= Patient reports performing seated LE strengthening and some standing/walking at home. Goal status: GOAL MET   LONG TERM GOALS: Target date: 03/27/2022  Pt will increase FOTO to at least  57 in order to demonstrate improvement in function related to mobility and QoL. Baseline: 7/3: 50; 8/28: deferred to next session; 01/02/2022= 47%; 11/27: 40% Goal status: ONGOING  2.  Pt (< 44 yrs old) will complete 5xSTS test < 20 seconds indicating an increase in LE strength and improved balance. Baseline: 7/3: 22mn:01seconds, 8/28: 40.5 seconds; 01/02/2022= difficulty with standing today- patient not feeling well and will retest next visit; 10/16: 31 seconds; 11/27: 62 seconds Goal status: ONGOING  3.  Pt will increase 10MWT to > 0.5 m/s (30 sec) as to improve gait speed for better home ambulation and reduce fall risk. Baseline: 7/3: 0.11 m/s with bRW; 8/30: 0.14 m/s with RW and WC follow; 01/02/2022= 0.14 m/s with Bariatric RW; 11/27: 0.13 m/s with bariatric RW Goal status: ONGOING   4.  Pt will increase BLE gross strength to 4+/5 as to improve functional strenth for independent gait, increased standing tolerance, and increased ADL ability. Baseline: 7/3: grossly 4-/5 (see strength section); 8/28: 4+/5  Goal status: MET  5.  Pt will ambulate 250' with LRAD and supervision with no rest breaks to allow for improved mobility within home and increased independence. Baseline: 7/3: 60.3'; 8/28: 97 ft; 01/02/2022= 75 feet with bariatric RW, CGA with close w/c follow- stopped today due to left hand pain and not feeling well. ; 11/27 deferred 11/29: 148 ft Goal status: ONGOING  ASSESSMENT:  CLINICAL IMPRESSION: Pt exhibits improved ability to activate glutes for sufficient hip extension to maintain balance when performing  sit>stand. He also required fewer cues to accomplish this. While pt shows progress, pt reports decreased awareness regarding losing balance in posterior direction. PT instructed pt in technique to assist with improving this awareness. Pt required up to mod a throughout session. The pt will benefit from further skilled PT to improve strength, balance, gait and mobility to decrease fall risk and increase QOL.   OBJECTIVE IMPAIRMENTS Abnormal gait, decreased activity tolerance, decreased balance, decreased coordination, decreased endurance, decreased mobility, difficulty walking, decreased ROM, decreased strength, postural dysfunction, and obesity.   ACTIVITY LIMITATIONS carrying, lifting, bending, sitting, standing, squatting, stairs, transfers, bed mobility, dressing, and locomotion level  PARTICIPATION LIMITATIONS: cleaning, laundry, interpersonal relationship, driving, and community activity  PERSONAL FACTORS Past/current experiences, Time since onset of injury/illness/exacerbation, and 3+ comorbidities: hx of brain tumor, past CVA, HTN, obesity, arthritis  are also affecting patient's functional outcome.   REHAB POTENTIAL: Good  CLINICAL DECISION MAKING: Stable/uncomplicated  EVALUATION COMPLEXITY: Low  PLAN: PT FREQUENCY: 2x/week  PT DURATION: 12 weeks  PLANNED INTERVENTIONS: Therapeutic exercises, Therapeutic activity, Neuromuscular re-education, Balance training, Gait training, Patient/Family education, Joint mobilization, Stair training, Vestibular training, Orthotic/Fit training, DME instructions, Dry Needling, Electrical stimulation, Wheelchair mobility training, Cryotherapy, Moist heat, Compression bandaging, Manual therapy, and Re-evaluation  PLAN FOR NEXT SESSION: strengthening, gait, endurance training, continue plan  3:15 PM, 03/20/22   HRicard DillonPT, DPT    [facility] 03/20/2022, 3:15 PM

## 2022-03-25 ENCOUNTER — Ambulatory Visit: Payer: Medicare Other

## 2022-03-25 DIAGNOSIS — M6281 Muscle weakness (generalized): Secondary | ICD-10-CM

## 2022-03-25 DIAGNOSIS — R262 Difficulty in walking, not elsewhere classified: Secondary | ICD-10-CM | POA: Diagnosis not present

## 2022-03-25 DIAGNOSIS — R2681 Unsteadiness on feet: Secondary | ICD-10-CM

## 2022-03-25 NOTE — Therapy (Signed)
OUTPATIENT PHYSICAL THERAPY NEURO TREATMENT NOTE   Patient Name: Curtis Powell MRN: [id number] DOB:04-26-1965, 56 y.o., male Today's Date: 03/25/2022  PCP: Valera Castle, MD REFERRING PROVIDER: Valera Castle, MD  PT End of Session - 03/25/22 1258     Visit Number 106    Number of Visits 109    Date for PT Re-Evaluation 03/27/22    Authorization Type UHC Medicare; [place] Medicaid    Authorization Time Period 01/02/2022-03/27/2022    Progress Note Due on Visit 100    PT Start Time 1301    PT Stop Time 1344    PT Time Calculation (min) 43 min    Equipment Utilized During Treatment Gait belt    Activity Tolerance Patient tolerated treatment well;No increased pain    Behavior During Therapy WFL for tasks assessed/performed              Past Medical History:  Diagnosis Date   Asthma    GERD (gastroesophageal reflux disease)    Hyperlipidemia    Hypertension    Obesity    Stroke [facility])    Past Surgical History:  Procedure Laterality Date   BRAIN SURGERY     Patient Active Problem List   Diagnosis Date Noted   Impaired glucose tolerance 03/23/2020   Learning disability 03/23/2020   Urinary and fecal incontinence 06/14/2019   Chronic pain syndrome 03/23/2019   Constipation 03/23/2019   Dysarthria 03/23/2019   Dysphagia 03/23/2019   Learning difficulty 03/23/2019   Morbid obesity ([place]) 03/23/2019   Muscle weakness 03/23/2019   Vitamin D deficiency 03/23/2019   Bilateral carotid artery stenosis 12/15/2018   Moderate aortic valve stenosis 12/15/2018   BMI 45.0-49.9, adult ([place]) 11/10/2018   Cerebrovascular accident ([place]) 05/02/2016   Hemiparesis affecting right side as late effect of cerebrovascular accident ([place]) 05/01/2016   Brain tumor ([place]) 11/24/2014   Gastro-esophageal reflux disease without esophagitis 11/24/2014   Accident due to mechanical fall without injury 08/03/2014   Essential hypertension 06/13/2014   Mixed hyperlipidemia 06/13/2014    Unspecified sequelae of cerebral infarction 06/13/2014   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/2014   Type 2 diabetes mellitus without complication ([place]) 40/37/0964   Neck mass 09/08/2013   Swimmer's ear 09/08/2013   Erectile dysfunction 01/03/2013   Prediabetes 01/03/2013   ONSET DATE: 05/09/2016  REFERRING DIAG: CVA (cerebral vascular accident) ([place])  THERAPY DIAG:  Muscle weakness (generalized)  Unsteadiness on feet  Difficulty in walking, not elsewhere classified  Rationale for Evaluation and Treatment Rehabilitation  SUBJECTIVE:  SUBJECTIVE STATEMENT Pt reports no stumbles/falls, aches or pains. He reports no other updates or current concerns. Pt reports some recent bathroom issues, going more frequently. Pt caregiver says noticed decrease in balance and that pt needs to urinate more frequently, she is concerned about a UTI. PT recommends reaching out to pt's physician. They verbalize understanding.   Pt accompanied by: family member/caregiver  PERTINENT HISTORY: History of CVA Feb 2018. He was previously fully independent, but this stroke caused significant deficits, mainly on the right side. He reports difficulty walking and transfers, uses a WC for most  His mom had to move in with him and now is his main caregiver. Patient had outpatient PT from November 2018-March 2022. Patient reports since being discharged from outpatient therapy he has kept up with his HEP daily. He does report desire for walking better and for getting in and out of bed easier. He reports his mom is helping with some ADLs. He reports needing help with rolling in bed. He reports his mom/aide assist with dressing. He has a home aide 3 days out of the week." PMH significant for hx of brain tumor, past CVA, and HTN. See chart for additional  information.  PAIN:  Are you having pain? No  PRECAUTIONS: None  WEIGHT BEARING RESTRICTIONS No  PLOF: Independent  PATIENT GOALS: "to walk and sit-to-stand"   OBJECTIVE:   DIAGNOSTIC FINDINGS: None recent   TODAY'S TREATMENT:    There Ex:    Multiple attempts to STS to RW in order to initiate gait training. However, pt unable to maintain balance with frequent posterior LOB into wheelchair, with min a.  Seated: 5# AW donned each LE  LAQ 2x15 each LE  Seated march 2x15 each LE   Chair dips 10x. Rates medium. Performs through decreased ROM compared to prior sessions.   STS 10x, 2x5 with multiple missed attempts, cuing for hip ext   NRM:  Static stand at BRW 30 sec, no UE support  Static stand at BRW with intermittent UE support dart exercise (to promote dynamic balance) multiple posterior LOB requiring cuing and up to mod a to correct.   PATIENT EDUCATION: Education details: Pt educated throughout session about proper posture and technique with exercises. Improved exercise technique, movement at target joints, use of target muscles after min to mod verbal, visual, tactile cues.   Person educated: Patient Education method: Explanation, Demonstration, Tactile cues, and Verbal cues Education comprehension: verbalized understanding, returned demonstration, and verbal cues required  [place] updates on this date, pt to continue as previously given  11/19/21:  Pt instructed on locations that may carry ankle weights for exercises to be performed at home.  Instructed to continue with daily walking.  GOALS: Goals reviewed with patient? No  SHORT TERM GOALS: Target date: 11/19/2021  Pt will be independent with HEP in order to improve strength and balance in order to decrease fall risk and improve function at home. Baseline: 8/28: pt reports doing HEP every day; 01/02/2022= Patient reports performing seated LE strengthening and some standing/walking at  home. Goal status: GOAL MET   LONG TERM GOALS: Target date: 03/27/2022  Pt will increase FOTO to at least 57 in order to demonstrate improvement in function related to mobility and QoL. Baseline: 7/3: 50; 8/28: deferred to next session; 01/02/2022= 47%; 11/27: 40% Goal status: ONGOING  2.  Pt (< 88 yrs old) will complete 5xSTS test < 20 seconds indicating an increase in LE strength and improved balance. Baseline: 7/3:  52mn:01seconds, 8/28: 40.5 seconds; 01/02/2022= difficulty with standing today- patient not feeling well and will retest next visit; 10/16: 31 seconds; 11/27: 62 seconds Goal status: ONGOING  3.  Pt will increase 10MWT to > 0.5 m/s (30 sec) as to improve gait speed for better home ambulation and reduce fall risk. Baseline: 7/3: 0.11 m/s with bRW; 8/30: 0.14 m/s with RW and WC follow; 01/02/2022= 0.14 m/s with Bariatric RW; 11/27: 0.13 m/s with bariatric RW Goal status: ONGOING   4.  Pt will increase BLE gross strength to 4+/5 as to improve functional strenth for independent gait, increased standing tolerance, and increased ADL ability. Baseline: 7/3: grossly 4-/5 (see strength section); 8/28: 4+/5  Goal status: MET  5.  Pt will ambulate 250' with LRAD and supervision with no rest breaks to allow for improved mobility within home and increased independence. Baseline: 7/3: 60.3'; 8/28: 97 ft; 01/02/2022= 75 feet with bariatric RW, CGA with close w/c follow- stopped today due to left hand pain and not feeling well. ; 11/27 deferred 11/29: 148 ft Goal status: ONGOING  ASSESSMENT:  CLINICAL IMPRESSION: Pt exhibits decreased balance and overall activity tolerance today, reporting increased need to use the bathroom and poorer balance. Pt did exhibit multiple posterior losses of balance into wheelchair when attempting to stand up and required min-mod for safe descent into chair.  PT instructed pt caregiver and pt to follow-up with his physician due to symptoms. They verbalized  understanding. The pt will benefit from further skilled PT to improve strength, balance, gait and mobility to decrease fall risk and increase QOL.   OBJECTIVE IMPAIRMENTS Abnormal gait, decreased activity tolerance, decreased balance, decreased coordination, decreased endurance, decreased mobility, difficulty walking, decreased ROM, decreased strength, postural dysfunction, and obesity.   ACTIVITY LIMITATIONS carrying, lifting, bending, sitting, standing, squatting, stairs, transfers, bed mobility, dressing, and locomotion level  PARTICIPATION LIMITATIONS: cleaning, laundry, interpersonal relationship, driving, and community activity  PERSONAL FACTORS Past/current experiences, Time since onset of injury/illness/exacerbation, and 3+ comorbidities: hx of brain tumor, past CVA, HTN, obesity, arthritis  are also affecting patient's functional outcome.   REHAB POTENTIAL: Good  CLINICAL DECISION MAKING: Stable/uncomplicated  EVALUATION COMPLEXITY: Low  PLAN: PT FREQUENCY: 2x/week  PT DURATION: 12 weeks  PLANNED INTERVENTIONS: Therapeutic exercises, Therapeutic activity, Neuromuscular re-education, Balance training, Gait training, Patient/Family education, Joint mobilization, Stair training, Vestibular training, Orthotic/Fit training, DME instructions, Dry Needling, Electrical stimulation, Wheelchair mobility training, Cryotherapy, Moist heat, Compression bandaging, Manual therapy, and Re-evaluation  PLAN FOR NEXT SESSION: strengthening, gait, endurance training, continue plan  3:36 PM, 03/25/22   HRicard DillonPT, DPT    Physical Therapist- [facility] 03/25/2022, 3:36 PM

## 2022-03-27 ENCOUNTER — Ambulatory Visit: Payer: Medicare Other

## 2022-03-27 DIAGNOSIS — R2681 Unsteadiness on feet: Secondary | ICD-10-CM

## 2022-03-27 DIAGNOSIS — R278 Other lack of coordination: Secondary | ICD-10-CM

## 2022-03-27 DIAGNOSIS — M6281 Muscle weakness (generalized): Secondary | ICD-10-CM

## 2022-03-27 DIAGNOSIS — R262 Difficulty in walking, not elsewhere classified: Secondary | ICD-10-CM | POA: Diagnosis not present

## 2022-03-27 NOTE — Therapy (Signed)
OUTPATIENT PHYSICAL THERAPY NEURO TREATMENT NOTE   Patient Name: Curtis Powell MRN: [id number] DOB:08/26/1965, 56 y.o., male Today's Date: 03/27/2022  PCP: Valera Castle, MD REFERRING PROVIDER: Valera Castle, MD  PT End of Session - 03/27/22 1616     Visit Number 107    Number of Visits 109    Date for PT Re-Evaluation 03/27/22    Authorization Type UHC Medicare; [place] Medicaid    Authorization Time Period 01/02/2022-03/27/2022    Progress Note Due on Visit 100    PT Start Time 1310    PT Stop Time 1345    PT Time Calculation (min) 35 min    Equipment Utilized During Treatment Gait belt    Activity Tolerance Patient tolerated treatment well;No increased pain    Behavior During Therapy WFL for tasks assessed/performed              Past Medical History:  Diagnosis Date   Asthma    GERD (gastroesophageal reflux disease)    Hyperlipidemia    Hypertension    Obesity    Stroke [facility])    Past Surgical History:  Procedure Laterality Date   BRAIN SURGERY     Patient Active Problem List   Diagnosis Date Noted   Impaired glucose tolerance 03/23/2020   Learning disability 03/23/2020   Urinary and fecal incontinence 06/14/2019   Chronic pain syndrome 03/23/2019   Constipation 03/23/2019   Dysarthria 03/23/2019   Dysphagia 03/23/2019   Learning difficulty 03/23/2019   Morbid obesity ([place]) 03/23/2019   Muscle weakness 03/23/2019   Vitamin D deficiency 03/23/2019   Bilateral carotid artery stenosis 12/15/2018   Moderate aortic valve stenosis 12/15/2018   BMI 45.0-49.9, adult ([place]) 11/10/2018   Cerebrovascular accident ([place]) 05/02/2016   Hemiparesis affecting right side as late effect of cerebrovascular accident ([place]) 05/01/2016   Brain tumor ([place]) 11/24/2014   Gastro-esophageal reflux disease without esophagitis 11/24/2014   Accident due to mechanical fall without injury 08/03/2014   Essential hypertension 06/13/2014   Mixed hyperlipidemia 06/13/2014    Unspecified sequelae of cerebral infarction 06/13/2014   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/2014   Type 2 diabetes mellitus without complication ([place]) 13/64/3837   Neck mass 09/08/2013   Swimmer's ear 09/08/2013   Erectile dysfunction 01/03/2013   Prediabetes 01/03/2013   ONSET DATE: 05/09/2016  REFERRING DIAG: CVA (cerebral vascular accident) ([place])  THERAPY DIAG:  Muscle weakness (generalized)  Unsteadiness on feet  Difficulty in walking, not elsewhere classified  Other lack of coordination  Rationale for Evaluation and Treatment Rehabilitation  SUBJECTIVE:  SUBJECTIVE STATEMENT Pt caregiver reports pt's mom aware of recent symptoms discussed last session, but has not called doctor yet. Pt reports he feels better today. He reports no stumbles/falls or pain.  Pt accompanied by: family member/caregiver  PERTINENT HISTORY: History of CVA Feb 2018. He was previously fully independent, but this stroke caused significant deficits, mainly on the right side. He reports difficulty walking and transfers, uses a WC for most  His mom had to move in with him and now is his main caregiver. Patient had outpatient PT from November 2018-March 2022. Patient reports since being discharged from outpatient therapy he has kept up with his HEP daily. He does report desire for walking better and for getting in and out of bed easier. He reports his mom is helping with some ADLs. He reports needing help with rolling in bed. He reports his mom/aide assist with dressing. He has a home aide 3 days out of the week." PMH significant for hx of brain tumor, past CVA, and HTN. See chart for additional information.  PAIN:  Are you having pain? No  PRECAUTIONS: None  WEIGHT BEARING RESTRICTIONS No  PLOF: Independent  PATIENT GOALS: "to walk  and sit-to-stand"   OBJECTIVE:   DIAGNOSTIC FINDINGS: None recent   TODAY'S TREATMENT:    There Ex:   STS 2x  5xSTS: 49 seconds (improved)  MMT: grossly 4+/5 BLE  Chair dips 2x10 Rates medium.  Standing march 1x8 alt LE, 1z8 2.5#  Ambulation with BRW x 10 meters, WC follow, and close CGA 1x through.   STS 2x - unable to complete further reps due to fatigue  FOTO: 40%   PATIENT EDUCATION: Education details: Pt educated throughout session about proper posture and technique with exercises. Improved exercise technique, movement at target joints, use of target muscles after min to mod verbal, visual, tactile cues.   Person educated: Patient Education method: Explanation, Demonstration, Tactile cues, and Verbal cues Education comprehension: verbalized understanding, returned demonstration, and verbal cues required  [place] updates on this date, pt to continue as previously given  11/19/21:  Pt instructed on locations that may carry ankle weights for exercises to be performed at home.  Instructed to continue with daily walking.  GOALS: Goals reviewed with patient? No  SHORT TERM GOALS: Target date: 11/19/2021  Pt will be independent with HEP in order to improve strength and balance in order to decrease fall risk and improve function at home. Baseline: 8/28: pt reports doing HEP every day; 01/02/2022= Patient reports performing seated LE strengthening and some standing/walking at home. Goal status: GOAL MET   LONG TERM GOALS: Target date: 03/27/2022  Pt will increase FOTO to at least 57 in order to demonstrate improvement in function related to mobility and QoL. Baseline: 7/3: 50; 8/28: deferred to next session; 01/02/2022= 47%; 12/20: 40% Goal status: ONGOING  2.  Pt (< 41 yrs old) will complete 5xSTS test < 20 seconds indicating an increase in LE strength and improved balance. Baseline: 7/3: 9mn:01seconds, 8/28: 40.5 seconds; 01/02/2022= difficulty with  standing today- patient not feeling well and will retest next visit; 10/16: 31 seconds; 11/27: 62 seconds; 12/20: 49 seconds  Goal status: ONGOING  3.  Pt will increase 10MWT to > 0.5 m/s (30 sec) as to improve gait speed for better home ambulation and reduce fall risk. Baseline: 7/3: 0.11 m/s with bRW; 8/30: 0.14 m/s with RW and WC follow; 01/02/2022= 0.14 m/s with Bariatric RW; 11/27: 0.13 m/s with bariatric RW;  Goal status: ONGOING   4.  Pt will increase BLE gross strength to 4+/5 as to improve functional strenth for independent gait, increased standing tolerance, and increased ADL ability. Baseline: 7/3: grossly 4-/5 (see strength section); 8/28: 4+/5; 03/27/22: 4+/5  Goal status: MET  5.  Pt will ambulate 250' with LRAD and supervision with no rest breaks to allow for improved mobility within home and increased independence. Baseline: 7/3: 60.3'; 8/28: 97 ft; 01/02/2022= 75 feet with bariatric RW, CGA with close w/c follow- stopped today due to left hand pain and not feeling well. ; 11/27 deferred 11/29: 148 ft Goal status: ONGOING  ASSESSMENT:  CLINICAL IMPRESSION: Session somewhat limited secondary to late arrival. Pt exhibits improved balance today standing at RW with fewer instances of posterior LOB. Initiated retesting on this date which is to be completed in full for recert next session. He shows improvement on 5xSTS, and FOTO today was 40%. Pt has maintained gross LE strength as 4+/5 B. The pt will benefit from further skilled PT to improve strength, balance, gait and mobility to decrease fall risk and increase QOL.   OBJECTIVE IMPAIRMENTS Abnormal gait, decreased activity tolerance, decreased balance, decreased coordination, decreased endurance, decreased mobility, difficulty walking, decreased ROM, decreased strength, postural dysfunction, and obesity.   ACTIVITY LIMITATIONS carrying, lifting, bending, sitting, standing, squatting, stairs, transfers, bed mobility, dressing, and  locomotion level  PARTICIPATION LIMITATIONS: cleaning, laundry, interpersonal relationship, driving, and community activity  PERSONAL FACTORS Past/current experiences, Time since onset of injury/illness/exacerbation, and 3+ comorbidities: hx of brain tumor, past CVA, HTN, obesity, arthritis  are also affecting patient's functional outcome.   REHAB POTENTIAL: Good  CLINICAL DECISION MAKING: Stable/uncomplicated  EVALUATION COMPLEXITY: Low  PLAN: PT FREQUENCY: 2x/week  PT DURATION: 12 weeks  PLANNED INTERVENTIONS: Therapeutic exercises, Therapeutic activity, Neuromuscular re-education, Balance training, Gait training, Patient/Family education, Joint mobilization, Stair training, Vestibular training, Orthotic/Fit training, DME instructions, Dry Needling, Electrical stimulation, Wheelchair mobility training, Cryotherapy, Moist heat, Compression bandaging, Manual therapy, and Re-evaluation  PLAN FOR NEXT SESSION: strengthening, gait, endurance training, continue plan  4:22 PM, 03/27/22   Ricard Dillon PT, DPT    Physical Therapist- [facility]  03/27/2022, 4:22 PM

## 2022-04-02 NOTE — Therapy (Signed)
OUTPATIENT PHYSICAL THERAPY NEURO TREATMENT NOTE/RECERT    Patient Name: Curtis Powell MRN: [id number] DOB:Apr 13, 1965, 56 y.o., male Today's Date: 03/27/2022  PCP: Valera Castle, MD REFERRING PROVIDER: Valera Castle, MD  PT End of Session - 03/27/22 1616     Visit Number 107    Number of Visits 109    Date for PT Re-Evaluation 03/27/22    Authorization Type [employer] Medicare; [place] Medicaid    Authorization Time Period 01/02/2022-03/27/2022    Progress Note Due on Visit 100    PT Start Time 1310    PT Stop Time 1345    PT Time Calculation (min) 35 min    Equipment Utilized During Treatment Gait belt    Activity Tolerance Patient tolerated treatment well;No increased pain    Behavior During Therapy WFL for tasks assessed/performed              Past Medical History:  Diagnosis Date   Asthma    GERD (gastroesophageal reflux disease)    Hyperlipidemia    Hypertension    Obesity    Stroke [facility])    Past Surgical History:  Procedure Laterality Date   BRAIN SURGERY     Patient Active Problem List   Diagnosis Date Noted   Impaired glucose tolerance 03/23/2020   Learning disability 03/23/2020   Urinary and fecal incontinence 06/14/2019   Chronic pain syndrome 03/23/2019   Constipation 03/23/2019   Dysarthria 03/23/2019   Dysphagia 03/23/2019   Learning difficulty 03/23/2019   Morbid obesity ([place]) 03/23/2019   Muscle weakness 03/23/2019   Vitamin D deficiency 03/23/2019   Bilateral carotid artery stenosis 12/15/2018   Moderate aortic valve stenosis 12/15/2018   BMI 45.0-49.9, adult ([place]) 11/10/2018   Cerebrovascular accident ([place]) 05/02/2016   Hemiparesis affecting right side as late effect of cerebrovascular accident ([place]) 05/01/2016   Brain tumor ([place]) 11/24/2014   Gastro-esophageal reflux disease without esophagitis 11/24/2014   Accident due to mechanical fall without injury 08/03/2014   Essential hypertension 06/13/2014   Mixed hyperlipidemia  06/13/2014   Unspecified sequelae of cerebral infarction 06/13/2014   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/2014   Type 2 diabetes mellitus without complication ([place]) 26/37/8588   Neck mass 09/08/2013   Swimmer's ear 09/08/2013   Erectile dysfunction 01/03/2013   Prediabetes 01/03/2013   ONSET DATE: 05/09/2016  REFERRING DIAG: CVA (cerebral vascular accident) ([place])  THERAPY DIAG:  Muscle weakness (generalized)  Unsteadiness on feet  Difficulty in walking, not elsewhere classified  Other lack of coordination  Rationale for Evaluation and Treatment Rehabilitation  SUBJECTIVE:  SUBJECTIVE STATEMENT Pt caregiver reports pt's mom aware of recent symptoms discussed last session, but has not called doctor yet. Pt reports he feels better today. He reports no stumbles/falls or pain.  Pt accompanied by: family member/caregiver  PERTINENT HISTORY: History of CVA Feb 2018. He was previously fully independent, but this stroke caused significant deficits, mainly on the right side. He reports difficulty walking and transfers, uses a WC for most  His mom had to move in with him and now is his main caregiver. Patient had outpatient PT from November 2018-March 2022. Patient reports since being discharged from outpatient therapy he has kept up with his HEP daily. He does report desire for walking better and for getting in and out of bed easier. He reports his mom is helping with some ADLs. He reports needing help with rolling in bed. He reports his mom/aide assist with dressing. He has a home aide 3 days out of the week." PMH significant for hx of brain tumor, past CVA, and HTN. See chart for additional information.  PAIN:  Are you having pain? No  PRECAUTIONS: None  WEIGHT BEARING RESTRICTIONS No  PLOF: Independent  PATIENT  GOALS: "to walk and sit-to-stand"   OBJECTIVE:   DIAGNOSTIC FINDINGS: None recent   TODAY'S TREATMENT:    There Ex:    **complete goals: 10MWT and gait for distance  STS 2x  5xSTS: 49 seconds (improved)  MMT: grossly 4+/5 BLE  Chair dips 2x10 Rates medium.  Standing march 1x8 alt LE, 1z8 2.5#  Ambulation with BRW x 10 meters, WC follow, and close CGA 1x through.   STS 2x - unable to complete further reps due to fatigue  FOTO: 40%   PATIENT EDUCATION: Education details: Pt educated throughout session about proper posture and technique with exercises. Improved exercise technique, movement at target joints, use of target muscles after min to mod verbal, visual, tactile cues.   Person educated: Patient Education method: Explanation, Demonstration, Tactile cues, and Verbal cues Education comprehension: verbalized understanding, returned demonstration, and verbal cues required  [place] updates on this date, pt to continue as previously given  11/19/21:  Pt instructed on locations that may carry ankle weights for exercises to be performed at home.  Instructed to continue with daily walking.  GOALS: Goals reviewed with patient? No  SHORT TERM GOALS: Target date: 11/19/2021  Pt will be independent with HEP in order to improve strength and balance in order to decrease fall risk and improve function at home. Baseline: 8/28: pt reports doing HEP every day; 01/02/2022= Patient reports performing seated LE strengthening and some standing/walking at home. Goal status: GOAL MET   LONG TERM GOALS: Target date: 03/27/2022  Pt will increase FOTO to at least 57 in order to demonstrate improvement in function related to mobility and QoL. Baseline: 7/3: 50; 8/28: deferred to next session; 01/02/2022= 47%; 12/20: 40% Goal status: ONGOING  2.  Pt (< 41 yrs old) will complete 5xSTS test < 20 seconds indicating an increase in LE strength and improved balance. Baseline:  7/3: 68mn:01seconds, 8/28: 40.5 seconds; 01/02/2022= difficulty with standing today- patient not feeling well and will retest next visit; 10/16: 31 seconds; 11/27: 62 seconds; 12/20: 49 seconds  Goal status: ONGOING  3.  Pt will increase 10MWT to > 0.5 m/s (30 sec) as to improve gait speed for better home ambulation and reduce fall risk. Baseline: 7/3: 0.11 m/s with bRW; 8/30: 0.14 m/s with RW and WC follow; 01/02/2022= 0.14 m/s with  Bariatric RW; 11/27: 0.13 m/s with bariatric RW;  Goal status: ONGOING   4.  Pt will increase BLE gross strength to 4+/5 as to improve functional strenth for independent gait, increased standing tolerance, and increased ADL ability. Baseline: 7/3: grossly 4-/5 (see strength section); 8/28: 4+/5; 03/27/22: 4+/5  Goal status: MET  5.  Pt will ambulate 250' with LRAD and supervision with no rest breaks to allow for improved mobility within home and increased independence. Baseline: 7/3: 60.3'; 8/28: 97 ft; 01/02/2022= 75 feet with bariatric RW, CGA with close w/c follow- stopped today due to left hand pain and not feeling well. ; 11/27 deferred 11/29: 148 ft Goal status: ONGOING  ASSESSMENT:  CLINICAL IMPRESSION: Session somewhat limited secondary to late arrival. Pt exhibits improved balance today standing at RW with fewer instances of posterior LOB. Initiated retesting on this date which is to be completed in full for recert next session. He shows improvement on 5xSTS, and FOTO today was 40%. Pt has maintained gross LE strength as 4+/5 B. The pt will benefit from further skilled PT to improve strength, balance, gait and mobility to decrease fall risk and increase QOL.   OBJECTIVE IMPAIRMENTS Abnormal gait, decreased activity tolerance, decreased balance, decreased coordination, decreased endurance, decreased mobility, difficulty walking, decreased ROM, decreased strength, postural dysfunction, and obesity.   ACTIVITY LIMITATIONS carrying, lifting, bending, sitting,  standing, squatting, stairs, transfers, bed mobility, dressing, and locomotion level  PARTICIPATION LIMITATIONS: cleaning, laundry, interpersonal relationship, driving, and community activity  PERSONAL FACTORS Past/current experiences, Time since onset of injury/illness/exacerbation, and 3+ comorbidities: hx of brain tumor, past CVA, HTN, obesity, arthritis  are also affecting patient's functional outcome.   REHAB POTENTIAL: Good  CLINICAL DECISION MAKING: Stable/uncomplicated  EVALUATION COMPLEXITY: Low  PLAN: PT FREQUENCY: 2x/week  PT DURATION: 12 weeks  PLANNED INTERVENTIONS: Therapeutic exercises, Therapeutic activity, Neuromuscular re-education, Balance training, Gait training, Patient/Family education, Joint mobilization, Stair training, Vestibular training, Orthotic/Fit training, DME instructions, Dry Needling, Electrical stimulation, Wheelchair mobility training, Cryotherapy, Moist heat, Compression bandaging, Manual therapy, and Re-evaluation  PLAN FOR NEXT SESSION: strengthening, gait, endurance training, continue plan  4:22 PM, 03/27/22   Haley Barak PT, DPT  Physical Therapist- [place]  [facility]  03/27/2022, 4:22 PM 

## 2022-04-03 ENCOUNTER — Ambulatory Visit: Payer: Medicare Other

## 2022-04-03 DIAGNOSIS — R278 Other lack of coordination: Secondary | ICD-10-CM

## 2022-04-03 DIAGNOSIS — R262 Difficulty in walking, not elsewhere classified: Secondary | ICD-10-CM

## 2022-04-03 DIAGNOSIS — R2681 Unsteadiness on feet: Secondary | ICD-10-CM

## 2022-04-03 DIAGNOSIS — M6281 Muscle weakness (generalized): Secondary | ICD-10-CM

## 2022-04-09 ENCOUNTER — Telehealth: Payer: Self-pay

## 2022-04-09 NOTE — Telephone Encounter (Signed)
Pt's mother called clinic and reports she wants patient to resume PT.  PT reports to pt's mother that they will need to seek new referral if they would like to return to PT. Pt's mother expresses frustration that caregiver/aide was there for discharge discussion. PT explained that discharge decisions are based on a patient's goal performance, progress or lack of progress and also asks pt's mother if she does not want aide/caregiver to be present during future appointments should pt return for future sessions. Pt's mother states that she does want the pt's caregiver/aide to be present during the pt's PT appointments in order to assist the pt.   Ricard Dillon PT, DPT

## 2022-04-10 ENCOUNTER — Ambulatory Visit: Payer: Medicare Other

## 2022-04-15 ENCOUNTER — Ambulatory Visit: Payer: Medicare Other

## 2022-04-17 ENCOUNTER — Ambulatory Visit: Payer: Medicare Other

## 2022-04-21 IMAGING — US US SCROTUM W/ DOPPLER COMPLETE
1 series · 14 of 25 positions shown · non-contrast
Comparison: None

CLINICAL DATA: RIGHT testicular pain for 2 months

EXAM:
SCROTAL ULTRASOUND
DOPPLER ULTRASOUND OF THE TESTICLES
TECHNIQUE: Complete ultrasound examination of the testicles, epididymis, and
other scrotal structures was performed. Color and spectral Doppler
ultrasound were also utilized to evaluate blood flow to the
testicles.

[Series 1: us scrotum w/doppler · 14 of 94 slices shown]
[im 1/94]
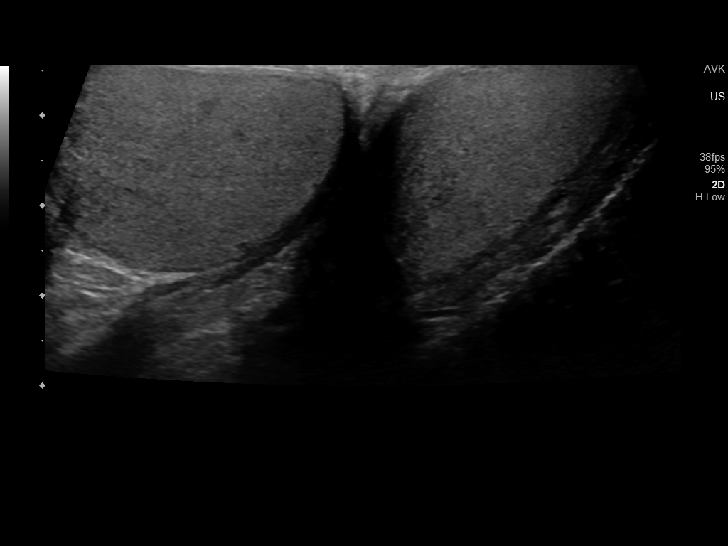
[im 8/94]
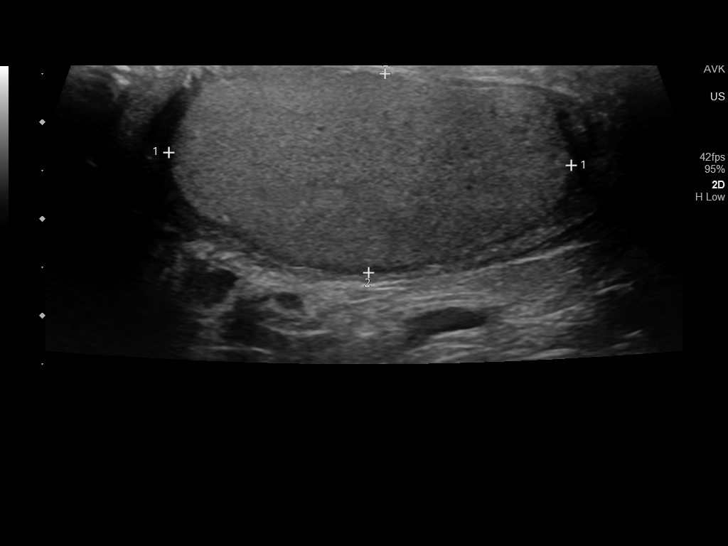
[im 16/94]
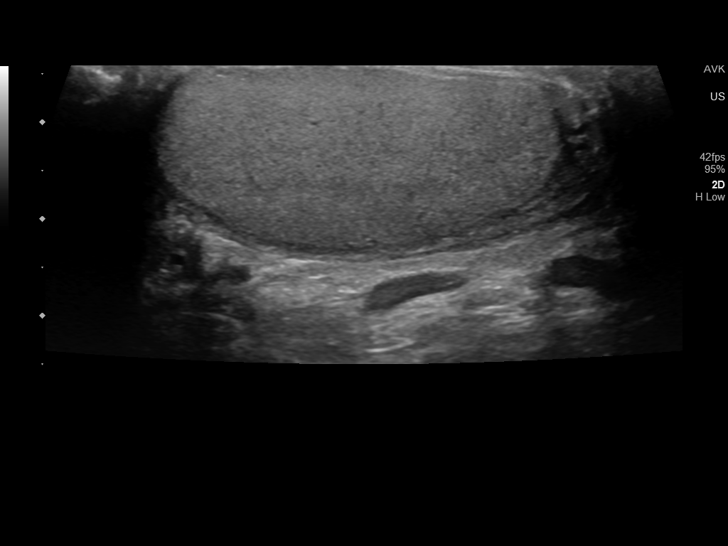
[im 24/94]
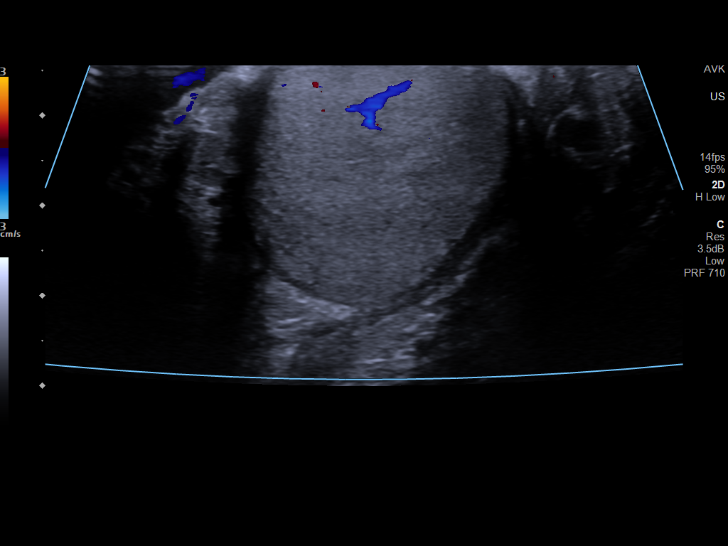
[im 32/94]
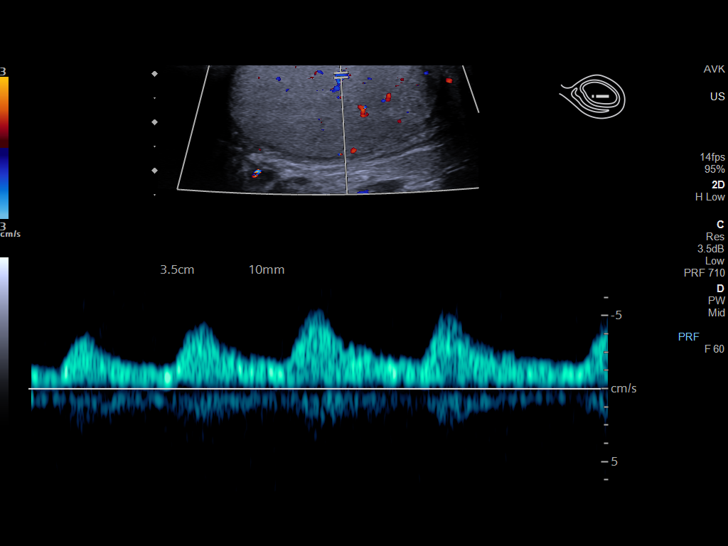
[im 35/94]
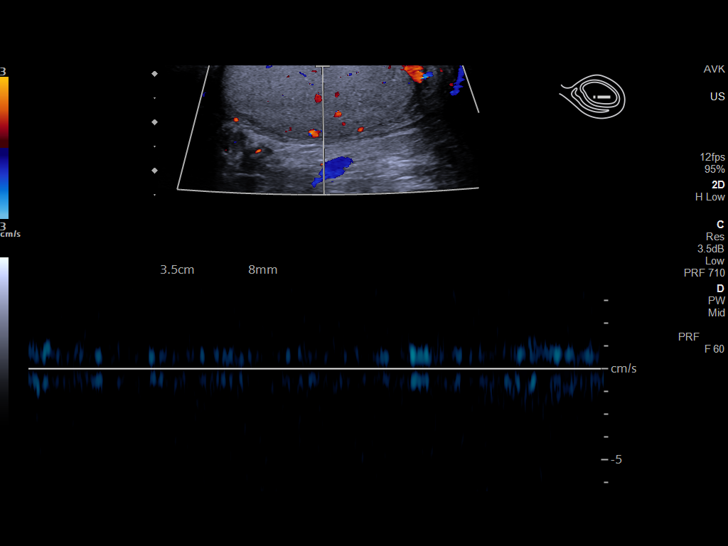
[im 43/94]
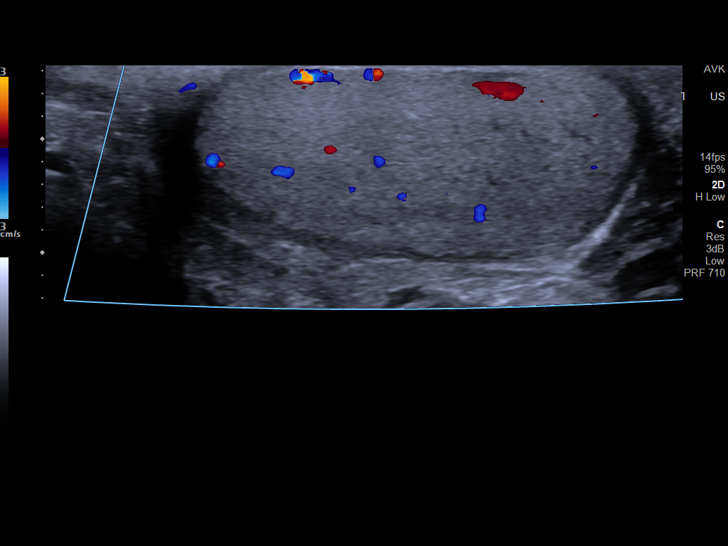
[im 51/94]
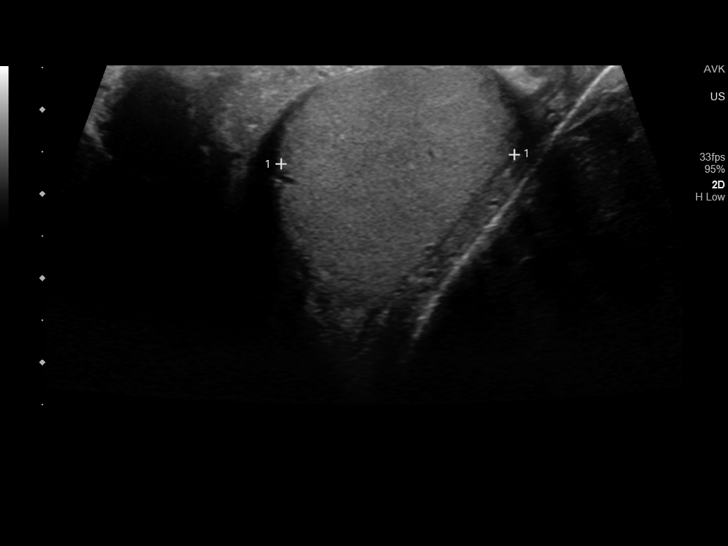
[im 59/94]
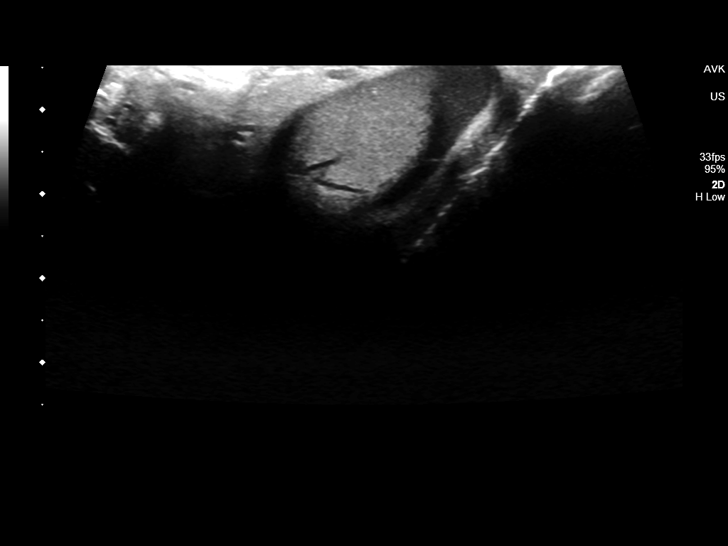
[im 63/94]
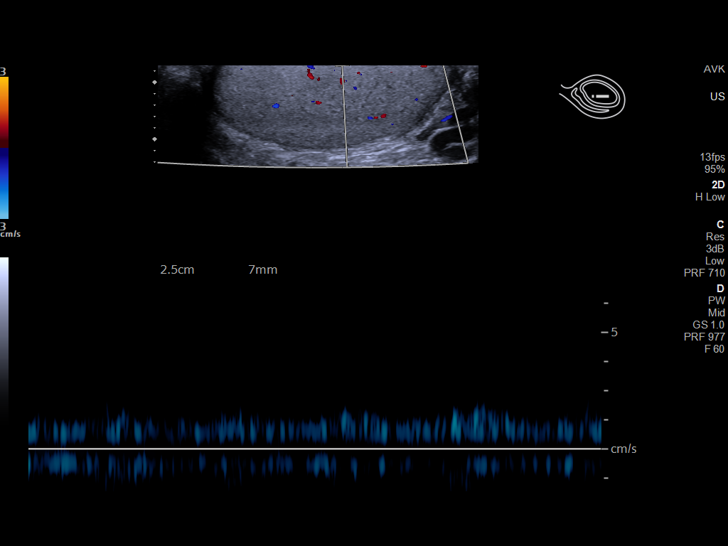
[im 70/94]
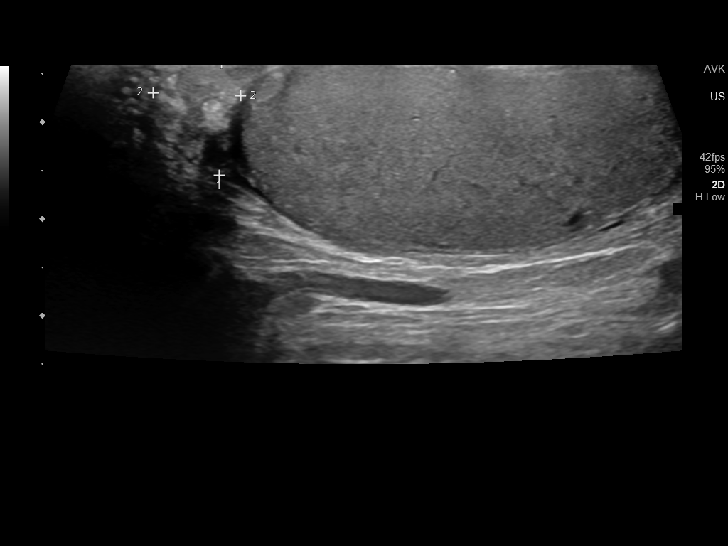
[im 78/94]
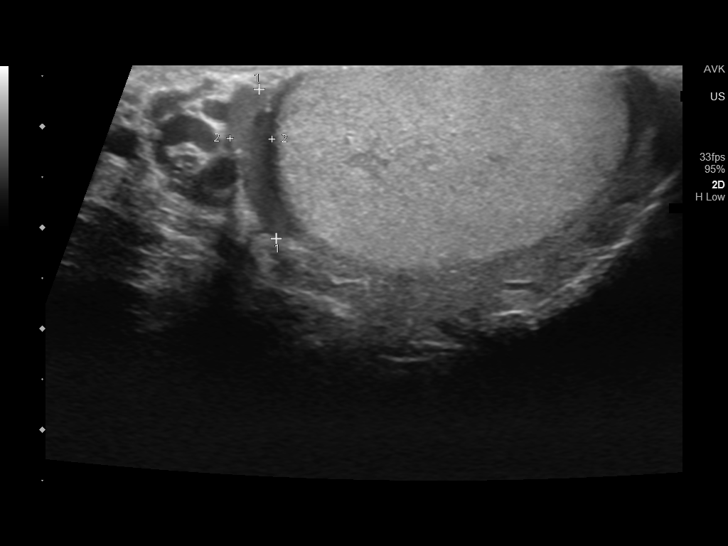
[im 86/94]
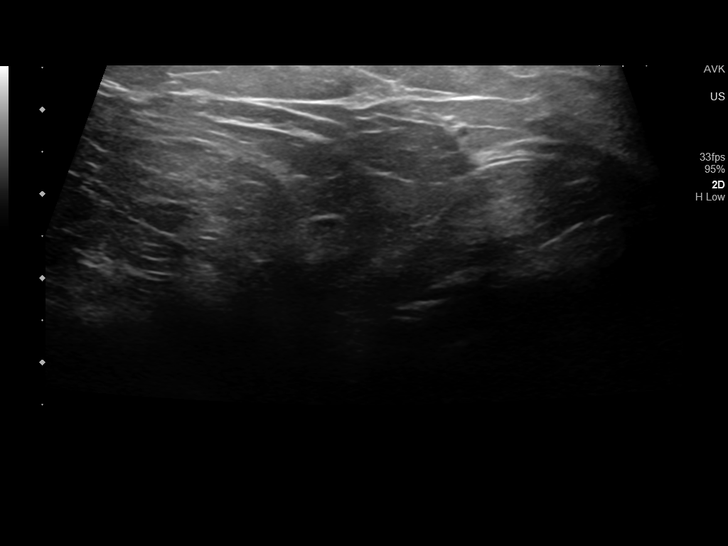
[im 94/94]
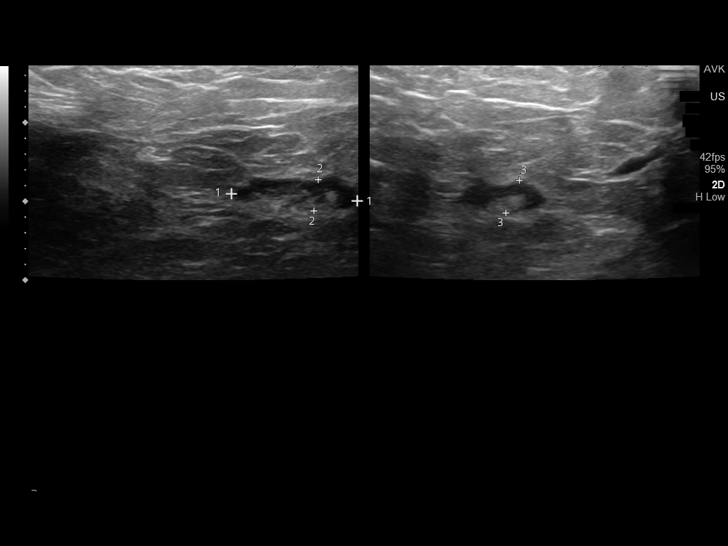

[14 of 25 positions shown; findings below may reference images not displayed]

FINDINGS: Right testicle

Measurements: 4.2 x 2.1 x 2.7 cm. Normal echogenicity without mass
or calcification. Internal blood flow present on color Doppler
imaging.

Left testicle

Measurements: 4.1 x 1.7 x 2.8 cm. Normal echogenicity without mass
or calcification. Internal blood flow present on color Doppler
imaging.

Right epididymis:  Mildly heterogeneous without focal mass

Left epididymis:  Normal in size and appearance.

Hydrocele:  None visualized.

Varicocele:  None visualized.

Pulsed Doppler interrogation of both testes demonstrates normal low
resistance arterial and venous waveforms bilaterally.
IMPRESSION: No definite scrotal sonographic abnormalities identified.

## 2022-04-22 ENCOUNTER — Ambulatory Visit: Payer: Medicare Other

## 2022-04-24 ENCOUNTER — Ambulatory Visit: Payer: Medicare Other

## 2022-04-29 ENCOUNTER — Ambulatory Visit: Payer: Medicare Other

## 2022-05-01 ENCOUNTER — Ambulatory Visit: Payer: Medicare Other

## 2022-05-06 ENCOUNTER — Ambulatory Visit: Payer: Medicare Other

## 2022-05-08 ENCOUNTER — Ambulatory Visit: Payer: Medicare Other

## 2022-05-13 ENCOUNTER — Ambulatory Visit: Payer: Medicare Other

## 2022-05-15 ENCOUNTER — Ambulatory Visit: Payer: Medicare Other

## 2022-05-20 ENCOUNTER — Ambulatory Visit: Payer: Medicare Other

## 2022-05-22 ENCOUNTER — Ambulatory Visit: Payer: Medicare Other

## 2022-05-27 ENCOUNTER — Ambulatory Visit: Payer: Medicare Other

## 2022-05-29 ENCOUNTER — Ambulatory Visit: Payer: Medicare Other

## 2022-06-03 ENCOUNTER — Ambulatory Visit: Payer: Medicare Other

## 2022-06-05 ENCOUNTER — Ambulatory Visit: Payer: Medicare Other

## 2022-06-10 ENCOUNTER — Ambulatory Visit: Payer: Medicare Other

## 2022-06-12 ENCOUNTER — Ambulatory Visit: Payer: Medicare Other

## 2022-06-17 ENCOUNTER — Ambulatory Visit: Payer: Medicare Other

## 2022-06-19 ENCOUNTER — Ambulatory Visit: Payer: Medicare Other

## 2022-06-24 ENCOUNTER — Ambulatory Visit: Payer: Medicare Other

## 2022-06-26 ENCOUNTER — Ambulatory Visit: Payer: Medicare Other

## 2022-07-01 ENCOUNTER — Ambulatory Visit: Payer: Medicare Other

## 2022-07-03 ENCOUNTER — Ambulatory Visit: Payer: Medicare Other

## 2022-07-08 ENCOUNTER — Ambulatory Visit: Payer: Medicare Other

## 2022-07-08 ENCOUNTER — Ambulatory Visit: Payer: Medicare Other | Admitting: Physical Therapy

## 2022-07-10 ENCOUNTER — Ambulatory Visit: Payer: Medicare Other

## 2022-07-10 ENCOUNTER — Encounter: Payer: 59 | Admitting: Physical Therapy

## 2022-07-15 ENCOUNTER — Ambulatory Visit: Payer: Medicare Other

## 2022-07-15 ENCOUNTER — Encounter: Payer: 59 | Admitting: Physical Therapy

## 2022-07-17 ENCOUNTER — Encounter: Payer: 59 | Admitting: Physical Therapy

## 2022-07-17 ENCOUNTER — Ambulatory Visit: Payer: Medicare Other

## 2022-07-22 ENCOUNTER — Encounter: Payer: 59 | Admitting: Physical Therapy

## 2022-07-22 ENCOUNTER — Ambulatory Visit: Payer: Medicare Other

## 2022-07-24 ENCOUNTER — Ambulatory Visit: Payer: Medicare Other

## 2022-07-24 ENCOUNTER — Encounter: Payer: 59 | Admitting: Physical Therapy

## 2022-07-29 ENCOUNTER — Ambulatory Visit: Payer: Medicare Other

## 2022-07-29 ENCOUNTER — Encounter: Payer: 59 | Admitting: Physical Therapy

## 2022-07-31 ENCOUNTER — Encounter: Payer: 59 | Admitting: Physical Therapy

## 2022-07-31 ENCOUNTER — Ambulatory Visit: Payer: Medicare Other

## 2022-08-05 ENCOUNTER — Ambulatory Visit: Payer: Medicare Other

## 2022-08-05 ENCOUNTER — Encounter: Payer: 59 | Admitting: Physical Therapy

## 2022-08-07 ENCOUNTER — Ambulatory Visit: Payer: Medicare Other

## 2022-08-07 ENCOUNTER — Encounter: Payer: 59 | Admitting: Physical Therapy

## 2022-08-12 ENCOUNTER — Ambulatory Visit: Payer: Medicare Other

## 2022-08-12 ENCOUNTER — Encounter: Payer: 59 | Admitting: Physical Therapy

## 2022-08-14 ENCOUNTER — Encounter: Payer: 59 | Admitting: Physical Therapy

## 2022-08-14 ENCOUNTER — Ambulatory Visit: Payer: Medicare Other

## 2022-08-19 ENCOUNTER — Ambulatory Visit: Payer: Medicare Other

## 2022-08-19 ENCOUNTER — Encounter: Payer: 59 | Admitting: Physical Therapy

## 2022-08-21 ENCOUNTER — Ambulatory Visit: Payer: Medicare Other

## 2022-08-21 ENCOUNTER — Encounter: Payer: 59 | Admitting: Physical Therapy

## 2022-08-26 ENCOUNTER — Ambulatory Visit: Payer: Medicare Other

## 2022-08-28 ENCOUNTER — Ambulatory Visit: Payer: Medicare Other

## 2022-09-04 ENCOUNTER — Ambulatory Visit: Payer: Medicare Other

## 2022-09-09 ENCOUNTER — Ambulatory Visit: Payer: Medicare Other

## 2022-09-11 ENCOUNTER — Ambulatory Visit: Payer: Medicare Other

## 2022-09-16 ENCOUNTER — Ambulatory Visit: Payer: Medicare Other

## 2022-09-18 ENCOUNTER — Ambulatory Visit: Payer: Medicare Other

## 2022-09-23 ENCOUNTER — Ambulatory Visit: Payer: Medicare Other

## 2022-09-25 ENCOUNTER — Ambulatory Visit: Payer: Medicare Other

## 2022-09-30 ENCOUNTER — Ambulatory Visit: Payer: Medicare Other

## 2022-09-30 NOTE — Therapy (Unsigned)
OUTPATIENT PHYSICAL THERAPY NEURO EVALUATION   Patient Name: Curtis Powell MRN: [id number] DOB:05/23/1965, 57 y.o., male Today's Date: 10/01/2022  PCP: Enid Baas, MD  REFERRING PROVIDER: Enid Baas, MD   PT End of Session - 10/01/22 1110     Visit Number 1    Number of Visits 25    Date for PT Re-Evaluation 12/24/22    PT Start Time 1017    PT Stop Time 1101    PT Time Calculation (min) 44 min    Equipment Utilized During Treatment Gait belt    Activity Tolerance Patient tolerated treatment well    Behavior During Therapy WFL for tasks assessed/performed               Past Medical History:  Diagnosis Date   Asthma    GERD (gastroesophageal reflux disease)    Hyperlipidemia    Hypertension    Obesity    Stroke [facility])    Past Surgical History:  Procedure Laterality Date   BRAIN SURGERY     Patient Active Problem List   Diagnosis Date Noted   Impaired glucose tolerance 03/23/2020   Learning disability 03/23/2020   Urinary and fecal incontinence 06/14/2019   Chronic pain syndrome 03/23/2019   Constipation 03/23/2019   Dysarthria 03/23/2019   Dysphagia 03/23/2019   Learning difficulty 03/23/2019   Morbid obesity ([facility]) 03/23/2019   Muscle weakness 03/23/2019   Vitamin D deficiency 03/23/2019   Bilateral carotid artery stenosis 12/15/2018   Moderate aortic valve stenosis 12/15/2018   BMI 45.0-49.9, adult ([facility]) 11/10/2018   Cerebrovascular accident ([facility]) 05/02/2016   Hemiparesis affecting right side as late effect of cerebrovascular accident ([facility]) 05/01/2016   Brain tumor ([facility]) 11/24/2014   Gastro-esophageal reflux disease without esophagitis 11/24/2014   Accident due to mechanical fall without injury 08/03/2014   Essential hypertension 06/13/2014   Mixed hyperlipidemia 06/13/2014   Unspecified sequelae of cerebral infarction 06/13/2014   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/2014   Type 2 diabetes mellitus without complication ([facility]) 01/06/2014   Neck mass  09/08/2013   Swimmer's ear 09/08/2013   Erectile dysfunction 01/03/2013   Prediabetes 01/03/2013   ONSET DATE: 05/09/2016  REFERRING DIAG: N82.956 (ICD-10-CM) - Hemiplegia and hemiparesis following cerebral infarction affecting right dominant side   THERAPY DIAG:  Unsteadiness on feet  Difficulty in walking, not elsewhere classified  Muscle weakness (generalized)  Other lack of coordination  Rationale for Evaluation and Treatment Rehabilitation  SUBJECTIVE:  SUBJECTIVE STATEMENT Pt known to PT and clinic. He returns today for PT evaluation.   Pt accompanied by: family member/caregiver  PERTINENT HISTORY:  The pt is a pleasant 57 yo male who returns to PT for evaluation of deficits s/o old CVA. Pt with hx of CVA Feb 2018. He was previously fully independent, but CVA caused significant deficits, mainly affecting his right side. Pt reports continued difficulty with walking and transfers. Pt still using WC for majority of mobility, otherwise he uses his RW. Pt lives with his mom who is his main caregiver. Patient had outpatient PT previously in clinic in 2023. He reports since discharge he has been completing his HEP and walking about every day. Pt would like to improve his walking mainly, but also would like to work on balance. He also reports difficulty with transfers. Pt's mom and his caregiver helps him with ADLs. However, he says he is now waiting for a new caregiver. Prior to this pt had caregiver 3x/week. PMH significant for hx of brain tumor, past CVA, DMII and HTN. See chart for additional information.  PAIN:  Are you having pain? Pt reports no pain currently  PRECAUTIONS: fall  WEIGHT BEARING RESTRICTIONS none  PLOF: required assistance with ADLs (old CVA), prior to CVA in 2018 was  indep.  History of Falls: Pt reports no recent falls  Home information: Pt lives with mother who is his primary caregiver. Previously had home aid, 3x/week but reports currently waiting for new one.  PATIENT GOALS: Pt would like to improve his ability to ambulate.  OBJECTIVE:   DIAGNOSTIC FINDINGS: No recent imaging per chart  Objective measures taken at eval unless otherwise specified:   Posture: forward head posture, rounded shoulders  MMT LE: grossly  4+/5, exception hip flexors and abductors 4/5 each bilat   Functional tests:  5xSTS:  34 sec, use of BUE (multiple initial attempts with posterior LOB in chair)   : 0.096 m/s with RW, close CGA, and WC follow  Gait for distance (goal 150 ft): deferred due to time  Gait mechanics: impaired, decreased speed (see ), crouched posture, poor postural stability and relies heavily on BUE support on RW. Pt now with bilat AFOS   FOTO: 44 (goal 50)    TODAY'S TREATMENT: 10/01/22  Instructed pt in HEP: Pt completes 1 set of 10 reps seated march in session and 2 sets of 10 reps of seated hip abduction in session. He rates both medium.   --Unable to issue printed version of HEP today due to printer error. Will provide next visit (see below)  PT reviewed importance of having a caregiver come with him to PT to assist him with toileting. PT also reviewed this with pt's mother upon arrival to session.  PATIENT EDUCATION: Education details: Exam findings, indications, plan, goals  Person educated: Patient Education method: Explanation, Demonstration, Tactile cues, and Verbal cues Education comprehension: verbalized understanding, returned demonstration, and verbal cues required  HOME EXERCISE PROGRAM:  Reviewed HEP in session but unable to issue printout today due to printer error - will provide printout next visit  Access Code: [email or web address] URL: [email or web address] Date: 10/01/2022 Prepared by: Temple Pacini  Exercises - Seated March  - 1 x daily - 6 x weekly - 3 sets - 10 reps - Seated Hip Abduction  - 1 x daily - 6 x weekly - 2 sets - 10 reps  GOALS: Goals reviewed with patient? No  SHORT TERM GOALS: Target date: 11/12/2022    Pt  will be independent with HEP in order to improve strength and balance in order to decrease fall risk and improve function at home. Baseline: instructed in HEP today, to provide printout next visit Goal status: NEW   LONG TERM GOALS: Target date: 12/24/2022   Pt will increase FOTO to at least 50 in order to demonstrate improvement in function related to mobility and QoL. Baseline: 44 Goal status: NEW  2.  Pt (< 60 yrs old) will complete 5xSTS test < 30 seconds indicating an increase in LE strength and improved balance. Baseline: 10/01/22 34 sec (multiple initial attempts with posterior LOB in chair) 12/20: 49 seconds  Goal status: NEW  3.  Pt will increase to > 0.5 m/s (30 sec) as to improve gait speed for better home ambulation and reduce fall risk. Baseline: 10/01/22 0.096 m/s with RW, close CGA, and WC follow previously 12/27: 0.11 m/s Goal status: NEW   4.  Pt will increase hip flexor and abductor  gross strength to 4+/5 bilat as to improve functional strength for independent gait, increased standing tolerance, and increased ADL ability. Baseline: 10/01/22 4+/5, exception hip flexors and abductors 4/5 each bilat  (previously 03/27/22: 4+/5 ) Goal status: NEW  5.  Pt will ambulate 150 ft with BRW and WC follow with no rest breaks to allow for improved mobility within home and increased independence. Baseline:10/01/22 deferred due to time, was previously 12/27: 105 ft with BRW  Goal status: NEW  ASSESSMENT:  CLINICAL IMPRESSION: The pt is a pleasant 57 yo male returning to outpatient PT to address deficits due to old CVA.  Exam findings indicate pt has decreased strength, impaired gait mechanics/speed/ability, and impaired balance. Pt with  general decrease in gait speed, BLE power and increased fall risk since discharge in December 2023. Pt has maintained majority of LE strength per MMT. The pt will benefit from further skilled PT to improve impairments, decrease fall risk and attain therapy goals.   OBJECTIVE IMPAIRMENTS Abnormal gait, decreased activity tolerance, decreased balance, decreased coordination, decreased endurance, decreased mobility, difficulty walking, decreased ROM, decreased strength, improper body mechanics, postural dysfunction, and obesity.   ACTIVITY LIMITATIONS carrying, lifting, bending, sitting, standing, squatting, stairs, transfers, bed mobility, continence, bathing, toileting, dressing, hygiene/grooming, and locomotion level  PARTICIPATION LIMITATIONS: meal prep, cleaning, laundry, interpersonal relationship, driving, shopping, community activity, and yard work  PERSONAL FACTORS Fitness, Past/current experiences, Time since onset of injury/illness/exacerbation, and 3+ comorbidities: hx of brain tumor, past CVA, HTN, obesity, learning disability, DMII  are also affecting patient's functional outcome.   REHAB POTENTIAL: Fair    CLINICAL DECISION MAKING: Stable/uncomplicated  EVALUATION COMPLEXITY: Low  PLAN: PT FREQUENCY: 2x/week  PT DURATION: 12 weeks  PLANNED INTERVENTIONS: Therapeutic exercises, Therapeutic activity, Neuromuscular re-education, Balance training, Gait training, Patient/Family education, Joint mobilization, Stair training, Vestibular training, Orthotic/Fit training, DME instructions, Dry Needling, Electrical stimulation, Wheelchair mobility training, Cryotherapy, Moist heat, Compression bandaging, Manual therapy, and Re-evaluation  PLAN FOR NEXT SESSION: provide printout of HEP, assess walking distance (see goal above), initiate strength interventions  11:38 AM, 10/01/22  Temple Pacini PT, DPT  Physical Therapist- [facility]  10/01/2022, 11:38  AM

## 2022-10-01 ENCOUNTER — Ambulatory Visit: Payer: 59 | Attending: Internal Medicine

## 2022-10-01 DIAGNOSIS — R278 Other lack of coordination: Secondary | ICD-10-CM | POA: Insufficient documentation

## 2022-10-01 DIAGNOSIS — I69351 Hemiplegia and hemiparesis following cerebral infarction affecting right dominant side: Secondary | ICD-10-CM | POA: Insufficient documentation

## 2022-10-01 DIAGNOSIS — R531 Weakness: Secondary | ICD-10-CM | POA: Insufficient documentation

## 2022-10-01 DIAGNOSIS — R2689 Other abnormalities of gait and mobility: Secondary | ICD-10-CM | POA: Diagnosis present

## 2022-10-01 DIAGNOSIS — R262 Difficulty in walking, not elsewhere classified: Secondary | ICD-10-CM | POA: Diagnosis present

## 2022-10-01 DIAGNOSIS — R296 Repeated falls: Secondary | ICD-10-CM | POA: Diagnosis present

## 2022-10-01 DIAGNOSIS — M6281 Muscle weakness (generalized): Secondary | ICD-10-CM | POA: Diagnosis present

## 2022-10-01 DIAGNOSIS — I639 Cerebral infarction, unspecified: Secondary | ICD-10-CM | POA: Insufficient documentation

## 2022-10-01 DIAGNOSIS — R2681 Unsteadiness on feet: Secondary | ICD-10-CM | POA: Insufficient documentation

## 2022-10-01 DIAGNOSIS — R269 Unspecified abnormalities of gait and mobility: Secondary | ICD-10-CM | POA: Insufficient documentation

## 2022-10-02 ENCOUNTER — Ambulatory Visit: Payer: Medicare Other

## 2022-10-03 ENCOUNTER — Ambulatory Visit: Payer: 59 | Admitting: Physical Therapy

## 2022-10-03 ENCOUNTER — Ambulatory Visit: Payer: 59

## 2022-10-03 DIAGNOSIS — R262 Difficulty in walking, not elsewhere classified: Secondary | ICD-10-CM

## 2022-10-03 DIAGNOSIS — R531 Weakness: Secondary | ICD-10-CM

## 2022-10-03 DIAGNOSIS — R2681 Unsteadiness on feet: Secondary | ICD-10-CM

## 2022-10-03 DIAGNOSIS — R2689 Other abnormalities of gait and mobility: Secondary | ICD-10-CM

## 2022-10-03 DIAGNOSIS — M6281 Muscle weakness (generalized): Secondary | ICD-10-CM

## 2022-10-03 DIAGNOSIS — I639 Cerebral infarction, unspecified: Secondary | ICD-10-CM

## 2022-10-03 DIAGNOSIS — R278 Other lack of coordination: Secondary | ICD-10-CM

## 2022-10-03 DIAGNOSIS — R269 Unspecified abnormalities of gait and mobility: Secondary | ICD-10-CM

## 2022-10-03 DIAGNOSIS — R296 Repeated falls: Secondary | ICD-10-CM

## 2022-10-03 DIAGNOSIS — I69351 Hemiplegia and hemiparesis following cerebral infarction affecting right dominant side: Secondary | ICD-10-CM

## 2022-10-03 NOTE — Therapy (Signed)
OUTPATIENT PHYSICAL THERAPY NEURO TREATMENT   Patient Name: Curtis Powell MRN: [id number] DOB:1966-01-25, 57 y.o., male Today's Date: 10/03/2022  PCP: Enid Baas, MD  REFERRING PROVIDER: Enid Baas, MD    PT End of Session - 10/03/22 1131     Visit Number 2    Number of Visits 25    Date for PT Re-Evaluation 12/24/22    PT Start Time 1131    PT Stop Time 1215    PT Time Calculation (min) 44 min    Equipment Utilized During Treatment Gait belt    Activity Tolerance Patient tolerated treatment well    Behavior During Therapy WFL for tasks assessed/performed               Past Medical History:  Diagnosis Date   Asthma    GERD (gastroesophageal reflux disease)    Hyperlipidemia    Hypertension    Obesity    Stroke [facility])    Past Surgical History:  Procedure Laterality Date   BRAIN SURGERY     Patient Active Problem List   Diagnosis Date Noted   Impaired glucose tolerance 03/23/2020   Learning disability 03/23/2020   Urinary and fecal incontinence 06/14/2019   Chronic pain syndrome 03/23/2019   Constipation 03/23/2019   Dysarthria 03/23/2019   Dysphagia 03/23/2019   Learning difficulty 03/23/2019   Morbid obesity (HCC) 03/23/2019   Muscle weakness 03/23/2019   Vitamin D deficiency 03/23/2019   Bilateral carotid artery stenosis 12/15/2018   Moderate aortic valve stenosis 12/15/2018   BMI 45.0-49.9, adult (HCC) 11/10/2018   Cerebrovascular accident (HCC) 05/02/2016   Hemiparesis affecting right side as late effect of cerebrovascular accident (HCC) 05/01/2016   Brain tumor (HCC) 11/24/2014   Gastro-esophageal reflux disease without esophagitis 11/24/2014   Accident due to mechanical fall without injury 08/03/2014   Essential hypertension 06/13/2014   Mixed hyperlipidemia 06/13/2014   Unspecified sequelae of cerebral infarction 06/13/2014   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/2014   Type 2 diabetes mellitus without complication (HCC) 01/06/2014   Neck mass  09/08/2013   Swimmer's ear 09/08/2013   Erectile dysfunction 01/03/2013   Prediabetes 01/03/2013   ONSET DATE: 05/09/2016  REFERRING DIAG: W09.811 (ICD-10-CM) - Hemiplegia and hemiparesis following cerebral infarction affecting right dominant side   THERAPY DIAG:  Unsteadiness on feet  Difficulty in walking, not elsewhere classified  Muscle weakness (generalized)  Other lack of coordination  Left-sided weakness  Other abnormalities of gait and mobility  Abnormality of gait and mobility  Repeated falls  Hemiplegia and hemiparesis following cerebral infarction affecting right dominant side (HCC)  Acute CVA (cerebrovascular accident) (HCC)  Rationale for Evaluation and Treatment Rehabilitation  SUBJECTIVE:  SUBJECTIVE STATEMENT  Pt reports no new complaints upon arrival.     Pt accompanied by: family member/caregiver  PERTINENT HISTORY:  The pt is a pleasant 57 yo male who returns to PT for evaluation of deficits s/o old CVA. Pt with hx of CVA Feb 2018. He was previously fully independent, but CVA caused significant deficits, mainly affecting his right side. Pt reports continued difficulty with walking and transfers. Pt still using WC for majority of mobility, otherwise he uses his RW. Pt lives with his mom who is his main caregiver. Patient had outpatient PT previously in clinic in 2023. He reports since discharge he has been completing his HEP and walking about every day. Pt would like to improve his walking mainly, but also would like to work on balance. He also reports difficulty with transfers. Pt's mom and his caregiver helps him with ADLs. However, he says he is now waiting for a new caregiver. Prior to this pt had caregiver 3x/week. PMH significant for hx of brain tumor, past CVA, DMII  and HTN. See chart for additional information.  PAIN:  Are you having pain? Pt reports no pain currently  PRECAUTIONS: fall  WEIGHT BEARING RESTRICTIONS none  PLOF: required assistance with ADLs (old CVA), prior to CVA in 2018 was indep.  History of Falls: Pt reports no recent falls  Home information: Pt lives with mother who is his primary caregiver. Previously had home aid, 3x/week but reports currently waiting for new one.  PATIENT GOALS: Pt would like to improve his ability to ambulate.  OBJECTIVE:   DIAGNOSTIC FINDINGS: No recent imaging per chart  Objective measures taken at eval unless otherwise specified:   Posture: forward head posture, rounded shoulders  MMT LE: grossly  4+/5, exception hip flexors and abductors 4/5 each bilat   Functional tests:  5xSTS:  34 sec, use of BUE (multiple initial attempts with posterior LOB in chair)   : 0.096 m/s with RW, close CGA, and WC follow  Gait for distance (goal 150 ft): deferred due to time  Gait mechanics: impaired, decreased speed (see ), crouched posture, poor postural stability and relies heavily on BUE support on RW. Pt now with bilat AFOS   FOTO: 44 (goal 50)    TODAY'S TREATMENT: 10/03/22  TherEx:  Seated marching, 2x10 each LE Seated hip abduction without resistance, 2x10 Seated LAQ, 2x10 each LE Seated hip adduction into rainbow physioball, 3 sec holds, 2x10 Seated hamstring curls, with YTB resistance, 2x10  Ambulation around the gym as part of goal assessment below:   PATIENT EDUCATION: Education details: Exam findings, indications, plan, goals  Person educated: Patient Education method: Explanation, Demonstration, Tactile cues, and Verbal cues Education comprehension: verbalized understanding, returned demonstration, and verbal cues required  HOME EXERCISE PROGRAM:  Reviewed HEP in session but unable to issue printout today due to printer error - will provide printout next visit   Access Code: [email or web address] URL: [email or web address] Date: 10/01/2022 Prepared by: Temple Pacini  Exercises - Seated March  - 1 x daily - 6 x weekly - 3 sets - 10 reps - Seated Hip Abduction  - 1 x daily - 6 x weekly - 2 sets - 10 reps  GOALS: Goals reviewed with patient? No  SHORT TERM GOALS: Target date: 11/12/2022  Pt will be independent with HEP in order to improve strength and balance in order to decrease fall risk and improve function at home. Baseline: instructed in HEP today, to provide printout next visit Goal status: NEW  LONG TERM GOALS: Target date: 12/24/2022  Pt will increase FOTO to at least 50 in order to demonstrate improvement in function related to mobility and QoL. Baseline: 44 Goal status: NEW  2.  Pt (< 60 yrs old) will complete 5xSTS test < 30 seconds indicating an increase in LE strength and improved balance. Baseline: 10/01/22 34 sec (multiple initial attempts with posterior LOB in chair) 12/20: 49 seconds  Goal status: NEW  3.  Pt will increase to > 0.5 m/s (30 sec) as to improve gait speed for better home ambulation and reduce fall risk. Baseline: 10/01/22 0.096 m/s with RW, close CGA, and WC follow previously 12/27: 0.11 m/s Goal status: NEW   4.  Pt will increase hip flexor and abductor  gross strength to 4+/5 bilat as to improve functional strength for independent gait, increased standing tolerance, and increased ADL ability. Baseline: 10/01/22 4+/5, exception hip flexors and abductors 4/5 each bilat  (previously 03/27/22: 4+/5 ) Goal status: NEW  5.  Pt will ambulate 150 ft with BRW and WC follow with no rest breaks to allow for improved mobility within home and increased independence. Baseline: 10/03/22 150' with BRW 12/27: 105 ft with BRW  Goal status: NEW    ASSESSMENT :  CLINICAL IMPRESSION:  Pt performed well with all the tasks given today and was able to ambulate further distance than when he was discharged in December.   Pt did require a chair follow, but otherwise was steady throughout the session.  Pt is demonstrating weakness on the R LE with exercises, however during ambulation, the L LE buckled on 2 occasions.  Pt did not notice and was able to continue with ambulation however.  Pt given printed HEP and advised to continue with them moving forward.   Pt will continue to benefit from skilled therapy to address remaining deficits in order to improve overall QoL and return to PLOF.      OBJECTIVE IMPAIRMENTS Abnormal gait, decreased activity tolerance, decreased balance, decreased coordination, decreased endurance, decreased mobility, difficulty walking, decreased ROM, decreased strength, improper body mechanics, postural dysfunction, and obesity.   ACTIVITY LIMITATIONS carrying, lifting, bending, sitting, standing, squatting, stairs, transfers, bed mobility, continence, bathing, toileting, dressing, hygiene/grooming, and locomotion level  PARTICIPATION LIMITATIONS: meal prep, cleaning, laundry, interpersonal relationship, driving, shopping, community activity, and yard work  PERSONAL FACTORS Fitness, Past/current experiences, Time since onset of injury/illness/exacerbation, and 3+ comorbidities: hx of brain tumor, past CVA, HTN, obesity, learning disability, DMII  are also affecting patient's functional outcome.   REHAB POTENTIAL: Fair    CLINICAL DECISION MAKING: Stable/uncomplicated  EVALUATION COMPLEXITY: Low  PLAN: PT FREQUENCY: 2x/week  PT DURATION: 12 weeks  PLANNED INTERVENTIONS: Therapeutic exercises, Therapeutic activity, Neuromuscular re-education, Balance training, Gait training, Patient/Family education, Joint mobilization, Stair training, Vestibular training, Orthotic/Fit training, DME instructions, Dry Needling, Electrical stimulation, Wheelchair mobility training, Cryotherapy, Moist heat, Compression bandaging, Manual therapy, and Re-evaluation  PLAN FOR NEXT SESSION: continue with  strengthening exercises of the LE's.    Nolon Bussing, PT, DPT Physical Therapist - [facility]  10/03/22, 11:32 AM

## 2022-10-07 ENCOUNTER — Ambulatory Visit: Payer: Medicare Other

## 2022-10-08 ENCOUNTER — Encounter: Payer: Self-pay | Admitting: Physical Therapy

## 2022-10-08 ENCOUNTER — Ambulatory Visit: Payer: 59 | Attending: Internal Medicine | Admitting: Physical Therapy

## 2022-10-08 DIAGNOSIS — M6281 Muscle weakness (generalized): Secondary | ICD-10-CM | POA: Diagnosis present

## 2022-10-08 DIAGNOSIS — R531 Weakness: Secondary | ICD-10-CM | POA: Diagnosis present

## 2022-10-08 DIAGNOSIS — R262 Difficulty in walking, not elsewhere classified: Secondary | ICD-10-CM | POA: Diagnosis present

## 2022-10-08 DIAGNOSIS — R296 Repeated falls: Secondary | ICD-10-CM | POA: Diagnosis present

## 2022-10-08 DIAGNOSIS — R269 Unspecified abnormalities of gait and mobility: Secondary | ICD-10-CM | POA: Diagnosis present

## 2022-10-08 DIAGNOSIS — R2681 Unsteadiness on feet: Secondary | ICD-10-CM | POA: Diagnosis present

## 2022-10-08 DIAGNOSIS — R278 Other lack of coordination: Secondary | ICD-10-CM | POA: Diagnosis present

## 2022-10-08 DIAGNOSIS — R2689 Other abnormalities of gait and mobility: Secondary | ICD-10-CM | POA: Insufficient documentation

## 2022-10-08 NOTE — Therapy (Signed)
OUTPATIENT PHYSICAL THERAPY NEURO TREATMENT   Patient Name: Curtis Powell MRN: [id number] DOB:03-Apr-1966, 57 y.o., male Today's Date: 10/08/2022  PCP: Enid Baas, MD  REFERRING PROVIDER: Enid Baas, MD    PT End of Session - 10/08/22 1011     Visit Number 3    Number of Visits 25    Date for PT Re-Evaluation 12/24/22    PT Start Time 1002    PT Stop Time 1043    PT Time Calculation (min) 41 min    Equipment Utilized During Treatment Gait belt    Activity Tolerance Patient tolerated treatment well    Behavior During Therapy WFL for tasks assessed/performed                Past Medical History:  Diagnosis Date   Asthma    GERD (gastroesophageal reflux disease)    Hyperlipidemia    Hypertension    Obesity    Stroke [facility])    Past Surgical History:  Procedure Laterality Date   BRAIN SURGERY     Patient Active Problem List   Diagnosis Date Noted   Impaired glucose tolerance 03/23/2020   Learning disability 03/23/2020   Urinary and fecal incontinence 06/14/2019   Chronic pain syndrome 03/23/2019   Constipation 03/23/2019   Dysarthria 03/23/2019   Dysphagia 03/23/2019   Learning difficulty 03/23/2019   Morbid obesity (HCC) 03/23/2019   Muscle weakness 03/23/2019   Vitamin D deficiency 03/23/2019   Bilateral carotid artery stenosis 12/15/2018   Moderate aortic valve stenosis 12/15/2018   BMI 45.0-49.9, adult (HCC) 11/10/2018   Cerebrovascular accident (HCC) 05/02/2016   Hemiparesis affecting right side as late effect of cerebrovascular accident (HCC) 05/01/2016   Brain tumor (HCC) 11/24/2014   Gastro-esophageal reflux disease without esophagitis 11/24/2014   Accident due to mechanical fall without injury 08/03/2014   Essential hypertension 06/13/2014   Mixed hyperlipidemia 06/13/2014   Unspecified sequelae of cerebral infarction 06/13/2014   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/2014   Type 2 diabetes mellitus without complication (HCC) 01/06/2014   Neck mass  09/08/2013   Swimmer's ear 09/08/2013   Erectile dysfunction 01/03/2013   Prediabetes 01/03/2013   ONSET DATE: 05/09/2016  REFERRING DIAG: W09.811 (ICD-10-CM) - Hemiplegia and hemiparesis following cerebral infarction affecting right dominant side   THERAPY DIAG:  Unsteadiness on feet  Difficulty in walking, not elsewhere classified  Muscle weakness (generalized)  Other lack of coordination  Rationale for Evaluation and Treatment Rehabilitation  SUBJECTIVE:  SUBJECTIVE STATEMENT Pt is doing well today, reports no significant changes since last session.  Pt accompanied by: family member/caregiver  PERTINENT HISTORY:  The pt is a pleasant 57 yo male who returns to PT for evaluation of deficits s/o old CVA. Pt with hx of CVA Feb 2018. He was previously fully independent, but CVA caused significant deficits, mainly affecting his right side. Pt reports continued difficulty with walking and transfers. Pt still using WC for majority of mobility, otherwise he uses his RW. Pt lives with his mom who is his main caregiver. Patient had outpatient PT previously in clinic in 2023. He reports since discharge he has been completing his HEP and walking about every day. Pt would like to improve his walking mainly, but also would like to work on balance. He also reports difficulty with transfers. Pt's mom and his caregiver helps him with ADLs. However, he says he is now waiting for a new caregiver. Prior to this pt had caregiver 3x/week. PMH significant for hx of brain tumor, past CVA, DMII and HTN. See chart for additional information.  PAIN:  Are you having pain? Pt reports no pain currently  PRECAUTIONS: fall  WEIGHT BEARING RESTRICTIONS none  PLOF: required assistance with ADLs (old CVA), prior to CVA in 2018  was indep.  History of Falls: Pt reports no recent falls  Home information: Pt lives with mother who is his primary caregiver. Previously had home aid, 3x/week but reports currently waiting for new one.  PATIENT GOALS: Pt would like to improve his ability to ambulate.  OBJECTIVE:   DIAGNOSTIC FINDINGS: No recent imaging per chart  Objective measures taken at eval unless otherwise specified:   Posture: forward head posture, rounded shoulders  MMT LE: grossly  4+/5, exception hip flexors and abductors 4/5 each bilat   Functional tests:  5xSTS:  34 sec, use of BUE (multiple initial attempts with posterior LOB in chair)   : 0.096 m/s with RW, close CGA, and WC follow  Gait for distance (goal 150 ft): deferred due to time  Gait mechanics: impaired, decreased speed (see ), crouched posture, poor postural stability and relies heavily on BUE support on RW. Pt now with bilat AFOS   FOTO: 44 (goal 50)    TODAY'S TREATMENT: 10/08/22 Unless otherwise stated, CGA was provided and gait belt donned in order to ensure pt safety.  TherEx: Following exercises done seated in WC: -Marching 2 x 10 each LE -Hip ADD with rainbow ball 3-4 sec holds -LAQ 2 x 10 ea side with 3 sec holds -Seated hamstring curls, with YTB resistance, 2x10 -Seated hip abduction without resistance, 2x10  Note RLE is unable to hold the same duration as L LE.  TA: Ambulation, B AFOs donned prior to arrival at session:   x 30 feet with RW, SPT CGA, WC follow behind. X 40 feet with RW, SPT CGA, WC follow behind. X 30 feet with RW, SPT CGA, WC follow behind.  -pt performing well throughout all ambulation bouts, no LOB though gait quality had slight diminishment towards end of first bout.  PATIENT EDUCATION: Education details: Exam findings, indications, plan, goals  Person educated: Patient Education method: Explanation, Demonstration, Tactile cues, and Verbal cues Education comprehension: verbalized  understanding, returned demonstration, and verbal cues required  HOME EXERCISE PROGRAM:  Reviewed HEP in session but unable to issue printout today due to printer error - will provide printout next visit  Access Code: [email or web address] URL: [email or web address] Date: 10/01/2022 Prepared by: Temple Pacini  Exercises - Seated March  - 1 x daily - 6 x weekly - 3 sets - 10 reps - Seated Hip Abduction  - 1 x daily - 6 x weekly - 2 sets - 10 reps  GOALS: Goals reviewed with patient? No  SHORT TERM GOALS: Target date: 11/12/2022  Pt will be independent with HEP in order to improve strength and balance in order to decrease fall risk and improve function at home. Baseline: instructed in HEP today, to provide printout next visit Goal status: NEW    LONG TERM GOALS: Target date: 12/24/2022  Pt will increase FOTO to at least 50 in order to demonstrate improvement in function related to mobility and QoL. Baseline: 44 Goal status: NEW  2.  Pt (< 60 yrs old) will complete 5xSTS test < 30 seconds indicating an increase in LE strength and improved balance. Baseline: 10/01/22 34 sec (multiple initial attempts with posterior LOB in chair) 12/20: 49 seconds  Goal status: NEW  3.  Pt will increase to > 0.5 m/s (30 sec) as to improve gait speed for better home ambulation and reduce fall risk. Baseline: 10/01/22 0.096 m/s with RW, close CGA, and WC follow previously 12/27: 0.11 m/s Goal status: NEW   4.  Pt will increase hip flexor and abductor  gross strength to 4+/5 bilat as to improve functional strength for independent gait, increased standing tolerance, and increased ADL ability. Baseline: 10/01/22 4+/5, exception hip flexors and abductors 4/5 each bilat  (previously 03/27/22: 4+/5 ) Goal status: NEW  5.  Pt will ambulate 150 ft with BRW and WC follow with no rest breaks to allow for improved mobility within home and increased independence. Baseline: 10/03/22 150' with BRW 12/27: 105 ft  with BRW  Goal status: NEW    ASSESSMENT :  CLINICAL IMPRESSION: Pt performed well with all the tasks given today, ambulation quality overall is going well, walking a distance of 100 feet total today. Pt did require a chair follow, but otherwise was steady throughout the session. Pt is demonstrating weakness on the R LE with exercises, primarily seen with inability to hold exercise position as long as L LE. Pt will continue to benefit from skilled therapy to address remaining deficits in order to improve overall QoL and return to PLOF.      OBJECTIVE IMPAIRMENTS Abnormal gait, decreased activity tolerance, decreased balance, decreased coordination, decreased endurance, decreased mobility, difficulty walking, decreased ROM, decreased strength, improper body mechanics, postural dysfunction, and obesity.   ACTIVITY LIMITATIONS carrying, lifting, bending, sitting, standing, squatting, stairs, transfers, bed mobility, continence, bathing, toileting, dressing, hygiene/grooming, and locomotion level  PARTICIPATION LIMITATIONS: meal prep, cleaning, laundry, interpersonal relationship, driving, shopping, community activity, and yard work  PERSONAL FACTORS Fitness, Past/current experiences, Time since onset of injury/illness/exacerbation, and 3+ comorbidities: hx of brain tumor, past CVA, HTN, obesity, learning disability, DMII  are also affecting patient's functional outcome.   REHAB POTENTIAL: Fair    CLINICAL DECISION MAKING: Stable/uncomplicated  EVALUATION COMPLEXITY: Low  PLAN: PT FREQUENCY: 2x/week  PT DURATION: 12 weeks  PLANNED INTERVENTIONS: Therapeutic exercises, Therapeutic activity, Neuromuscular re-education, Balance training, Gait training, Patient/Family education, Joint mobilization, Stair training, Vestibular training, Orthotic/Fit training, DME instructions, Dry Needling, Electrical stimulation, Wheelchair mobility training, Cryotherapy, Moist heat, Compression bandaging,  Manual therapy, and Re-evaluation  PLAN FOR NEXT SESSION: continue with strengthening exercises of the LE's, functional ambulation when appropriate.   Cecile Sheerer, SPT   10/08/22, 11:05 AM  This entire session was performed under direct supervision  and direction of a licensed Estate agent . I have personally read, edited and approve of the note as written.    This licensed clinician was present and actively directing care throughout the session at all times.  Norman Herrlich PT ,DPT Physical Therapist- [place]  [facility]

## 2022-10-09 ENCOUNTER — Ambulatory Visit: Payer: Medicare Other

## 2022-10-14 ENCOUNTER — Ambulatory Visit: Payer: Medicare Other

## 2022-10-14 ENCOUNTER — Ambulatory Visit: Payer: 59 | Admitting: Physical Therapy

## 2022-10-14 DIAGNOSIS — R2689 Other abnormalities of gait and mobility: Secondary | ICD-10-CM

## 2022-10-14 DIAGNOSIS — M6281 Muscle weakness (generalized): Secondary | ICD-10-CM

## 2022-10-14 DIAGNOSIS — R531 Weakness: Secondary | ICD-10-CM

## 2022-10-14 DIAGNOSIS — R269 Unspecified abnormalities of gait and mobility: Secondary | ICD-10-CM

## 2022-10-14 DIAGNOSIS — R2681 Unsteadiness on feet: Secondary | ICD-10-CM | POA: Diagnosis not present

## 2022-10-14 DIAGNOSIS — R278 Other lack of coordination: Secondary | ICD-10-CM

## 2022-10-14 DIAGNOSIS — R262 Difficulty in walking, not elsewhere classified: Secondary | ICD-10-CM

## 2022-10-14 NOTE — Therapy (Signed)
OUTPATIENT PHYSICAL THERAPY NEURO TREATMENT   Patient Name: Curtis Powell MRN: [id number] DOB:10/03/1965, 57 y.o., male Today's Date: 10/15/2022  PCP: Enid Baas, MD  REFERRING PROVIDER: Enid Baas, MD    PT End of Session - 10/14/22 1147     Visit Number 4    Number of Visits 25    Date for PT Re-Evaluation 12/24/22    PT Start Time 1150    PT Stop Time 1230    PT Time Calculation (min) 40 min    Equipment Utilized During Treatment Gait belt    Activity Tolerance Patient tolerated treatment well    Behavior During Therapy WFL for tasks assessed/performed                 Past Medical History:  Diagnosis Date   Asthma    GERD (gastroesophageal reflux disease)    Hyperlipidemia    Hypertension    Obesity    Stroke [facility])    Past Surgical History:  Procedure Laterality Date   BRAIN SURGERY     Patient Active Problem List   Diagnosis Date Noted   Impaired glucose tolerance 03/23/2020   Learning disability 03/23/2020   Urinary and fecal incontinence 06/14/2019   Chronic pain syndrome 03/23/2019   Constipation 03/23/2019   Dysarthria 03/23/2019   Dysphagia 03/23/2019   Learning difficulty 03/23/2019   Morbid obesity (HCC) 03/23/2019   Muscle weakness 03/23/2019   Vitamin D deficiency 03/23/2019   Bilateral carotid artery stenosis 12/15/2018   Moderate aortic valve stenosis 12/15/2018   BMI 45.0-49.9, adult (HCC) 11/10/2018   Cerebrovascular accident (HCC) 05/02/2016   Hemiparesis affecting right side as late effect of cerebrovascular accident (HCC) 05/01/2016   Brain tumor (HCC) 11/24/2014   Gastro-esophageal reflux disease without esophagitis 11/24/2014   Accident due to mechanical fall without injury 08/03/2014   Essential hypertension 06/13/2014   Mixed hyperlipidemia 06/13/2014   Unspecified sequelae of cerebral infarction 06/13/2014   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/2014   Type 2 diabetes mellitus without complication (HCC) 01/06/2014   Neck mass  09/08/2013   Swimmer's ear 09/08/2013   Erectile dysfunction 01/03/2013   Prediabetes 01/03/2013   ONSET DATE: 05/09/2016  REFERRING DIAG: W09.811 (ICD-10-CM) - Hemiplegia and hemiparesis following cerebral infarction affecting right dominant side   THERAPY DIAG:  Unsteadiness on feet  Difficulty in walking, not elsewhere classified  Muscle weakness (generalized)  Other lack of coordination  Left-sided weakness  Other abnormalities of gait and mobility  Abnormality of gait and mobility  Rationale for Evaluation and Treatment Rehabilitation  SUBJECTIVE:  SUBJECTIVE STATEMENT Pt is doing well today, reports no significant changes since last session.  Pt accompanied by: family member/caregiver  PERTINENT HISTORY:  The pt is a pleasant 57 yo male who returns to PT for evaluation of deficits s/o old CVA. Pt with hx of CVA Feb 2018. He was previously fully independent, but CVA caused significant deficits, mainly affecting his right side. Pt reports continued difficulty with walking and transfers. Pt still using WC for majority of mobility, otherwise he uses his RW. Pt lives with his mom who is his main caregiver. Patient had outpatient PT previously in clinic in 2023. He reports since discharge he has been completing his HEP and walking about every day. Pt would like to improve his walking mainly, but also would like to work on balance. He also reports difficulty with transfers. Pt's mom and his caregiver helps him with ADLs. However, he says he is now waiting for a new caregiver. Prior to this pt had caregiver 3x/week. PMH significant for hx of brain tumor, past CVA, DMII and HTN. See chart for additional information.  PAIN:  Are you having pain? Pt reports no pain currently  PRECAUTIONS:  fall  WEIGHT BEARING RESTRICTIONS none  PLOF: required assistance with ADLs (old CVA), prior to CVA in 2018 was indep.  History of Falls: Pt reports no recent falls  Home information: Pt lives with mother who is his primary caregiver. Previously had home aid, 3x/week but reports currently waiting for new one.  PATIENT GOALS: Pt would like to improve his ability to ambulate.  OBJECTIVE:   DIAGNOSTIC FINDINGS: No recent imaging per chart  Objective measures taken at eval unless otherwise specified:   Posture: forward head posture, rounded shoulders  MMT LE: grossly  4+/5, exception hip flexors and abductors 4/5 each bilat   Functional tests:  5xSTS:  34 sec, use of BUE (multiple initial attempts with posterior LOB in chair)   : 0.096 m/s with RW, close CGA, and WC follow  Gait for distance (goal 150 ft): deferred due to time  Gait mechanics: impaired, decreased speed (see ), crouched posture, poor postural stability and relies heavily on BUE support on RW. Pt now with bilat AFOS   FOTO: 44 (goal 50)    TODAY'S TREATMENT: 10/15/22 Unless otherwise stated, CGA-minA was provided and gait belt donned in order to ensure pt safety.  TherEx:  Stand pivot transfer with RW to arm chair and min assist from PT for safety and improved weight shift R and L for adequate hip abduction.  Sit<>stand 2x 5 Standing reciprocal march 2x 10 bil  Standing Hip abduction 2x 8 bil  Forward/reverse gait 2x 66ft each.  Gait with RW to transport chair at end of session x 33ft with CGA as listed with increased time for safety in turn to sit in chair.  Cues from PT for improved weight shift and hip extensors/HS activation.   Following exercises done seated in arm chair: -LE push down with manual resistance x 10 each LE -Hip ADD with bolster 3-4 sec holds x 10  -LAQ  x 10 ea side with 3 sec holds -Seated hamstring curls, with YTB resistance, 2x10 -Seated hip abduction without resistance,  2x10  Note RLE is unable to hold the same duration as L LE.   PATIENT EDUCATION: Education details: Exam findings, indications, plan, goals  Person educated: Patient Education method: Explanation, Demonstration, Tactile cues, and Verbal cues Education comprehension: verbalized understanding, returned demonstration, and verbal cues required  HOME  EXERCISE PROGRAM:  Reviewed HEP in session but unable to issue printout today due to printer error - will provide printout next visit  Access Code: [email or web address] URL: [email or web address] Date: 10/01/2022 Prepared by: Temple Pacini  Exercises - Seated March  - 1 x daily - 6 x weekly - 3 sets - 10 reps - Seated Hip Abduction  - 1 x daily - 6 x weekly - 2 sets - 10 reps  GOALS: Goals reviewed with patient? No  SHORT TERM GOALS: Target date: 11/12/2022  Pt will be independent with HEP in order to improve strength and balance in order to decrease fall risk and improve function at home. Baseline: instructed in HEP today, to provide printout next visit Goal status: NEW    LONG TERM GOALS: Target date: 12/24/2022  Pt will increase FOTO to at least 50 in order to demonstrate improvement in function related to mobility and QoL. Baseline: 44 Goal status: NEW  2.  Pt (< 60 yrs old) will complete 5xSTS test < 30 seconds indicating an increase in LE strength and improved balance. Baseline: 10/01/22 34 sec (multiple initial attempts with posterior LOB in chair) 12/20: 49 seconds  Goal status: NEW  3.  Pt will increase to > 0.5 m/s (30 sec) as to improve gait speed for better home ambulation and reduce fall risk. Baseline: 10/01/22 0.096 m/s with RW, close CGA, and WC follow previously 12/27: 0.11 m/s Goal status: NEW   4.  Pt will increase hip flexor and abductor  gross strength to 4+/5 bilat as to improve functional strength for independent gait, increased standing tolerance, and increased ADL ability. Baseline: 10/01/22 4+/5,  exception hip flexors and abductors 4/5 each bilat  (previously 03/27/22: 4+/5 ) Goal status: NEW  5.  Pt will ambulate 150 ft with BRW and WC follow with no rest breaks to allow for improved mobility within home and increased independence. Baseline: 10/03/22 150' with BRW 12/27: 105 ft with BRW  Goal status: NEW    ASSESSMENT :  CLINICAL IMPRESSION: Pt performed well with all the tasks given today. PT treatment focused on functional strengthening in standing. Min cues for adequate weight shift L and R to allow improved activation of L HS and gluteals. No instance of knee instability or LOB while in standing on this day. Will benefit from focused strengthening of hip abductors, hamstrings, and hip extensors. Pt will continue to benefit from skilled therapy to address remaining deficits in order to improve overall QoL and return to PLOF.      OBJECTIVE IMPAIRMENTS Abnormal gait, decreased activity tolerance, decreased balance, decreased coordination, decreased endurance, decreased mobility, difficulty walking, decreased ROM, decreased strength, improper body mechanics, postural dysfunction, and obesity.   ACTIVITY LIMITATIONS carrying, lifting, bending, sitting, standing, squatting, stairs, transfers, bed mobility, continence, bathing, toileting, dressing, hygiene/grooming, and locomotion level  PARTICIPATION LIMITATIONS: meal prep, cleaning, laundry, interpersonal relationship, driving, shopping, community activity, and yard work  PERSONAL FACTORS Fitness, Past/current experiences, Time since onset of injury/illness/exacerbation, and 3+ comorbidities: hx of brain tumor, past CVA, HTN, obesity, learning disability, DMII  are also affecting patient's functional outcome.   REHAB POTENTIAL: Fair    CLINICAL DECISION MAKING: Stable/uncomplicated  EVALUATION COMPLEXITY: Low  PLAN: PT FREQUENCY: 2x/week  PT DURATION: 12 weeks  PLANNED INTERVENTIONS: Therapeutic exercises, Therapeutic  activity, Neuromuscular re-education, Balance training, Gait training, Patient/Family education, Joint mobilization, Stair training, Vestibular training, Orthotic/Fit training, DME instructions, Dry Needling, Electrical stimulation, Wheelchair mobility training, Cryotherapy, Moist heat, Compression bandaging, Manual therapy,  and Re-evaluation  PLAN FOR NEXT SESSION:   continue with strengthening exercises of the LE's,  functional ambulation when appropriate.   Grier Rocher PT, DPT  Physical Therapist - [place]  [facility]  9:28 AM 10/15/22

## 2022-10-16 ENCOUNTER — Ambulatory Visit: Payer: Medicare Other

## 2022-10-17 ENCOUNTER — Encounter: Payer: Self-pay | Admitting: Physical Therapy

## 2022-10-17 ENCOUNTER — Ambulatory Visit: Payer: 59 | Admitting: Physical Therapy

## 2022-10-17 DIAGNOSIS — R278 Other lack of coordination: Secondary | ICD-10-CM

## 2022-10-17 DIAGNOSIS — R2681 Unsteadiness on feet: Secondary | ICD-10-CM | POA: Diagnosis not present

## 2022-10-17 DIAGNOSIS — M6281 Muscle weakness (generalized): Secondary | ICD-10-CM

## 2022-10-17 DIAGNOSIS — R262 Difficulty in walking, not elsewhere classified: Secondary | ICD-10-CM

## 2022-10-17 DIAGNOSIS — R531 Weakness: Secondary | ICD-10-CM

## 2022-10-17 NOTE — Therapy (Signed)
OUTPATIENT PHYSICAL THERAPY NEURO TREATMENT   Patient Name: Curtis Powell MRN: [id number] DOB:03-27-1966, 57 y.o., male Today's Date: 10/17/2022  PCP: Enid Baas, MD  REFERRING PROVIDER: Enid Baas, MD    PT End of Session - 10/17/22 0954     Visit Number 5    Number of Visits 25    Date for PT Re-Evaluation 12/24/22    PT Start Time 1003    PT Stop Time 1045    PT Time Calculation (min) 42 min    Equipment Utilized During Treatment Gait belt    Activity Tolerance Patient tolerated treatment well    Behavior During Therapy WFL for tasks assessed/performed               Past Medical History:  Diagnosis Date   Asthma    GERD (gastroesophageal reflux disease)    Hyperlipidemia    Hypertension    Obesity    Stroke [facility])    Past Surgical History:  Procedure Laterality Date   BRAIN SURGERY     Patient Active Problem List   Diagnosis Date Noted   Impaired glucose tolerance 03/23/2020   Learning disability 03/23/2020   Urinary and fecal incontinence 06/14/2019   Chronic pain syndrome 03/23/2019   Constipation 03/23/2019   Dysarthria 03/23/2019   Dysphagia 03/23/2019   Learning difficulty 03/23/2019   Morbid obesity (HCC) 03/23/2019   Muscle weakness 03/23/2019   Vitamin D deficiency 03/23/2019   Bilateral carotid artery stenosis 12/15/2018   Moderate aortic valve stenosis 12/15/2018   BMI 45.0-49.9, adult (HCC) 11/10/2018   Cerebrovascular accident (HCC) 05/02/2016   Hemiparesis affecting right side as late effect of cerebrovascular accident (HCC) 05/01/2016   Brain tumor (HCC) 11/24/2014   Gastro-esophageal reflux disease without esophagitis 11/24/2014   Accident due to mechanical fall without injury 08/03/2014   Essential hypertension 06/13/2014   Mixed hyperlipidemia 06/13/2014   Unspecified sequelae of cerebral infarction 06/13/2014   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/2014   Type 2 diabetes mellitus without complication (HCC) 01/06/2014   Neck mass  09/08/2013   Swimmer's ear 09/08/2013   Erectile dysfunction 01/03/2013   Prediabetes 01/03/2013   ONSET DATE: 05/09/2016  REFERRING DIAG: W23.762 (ICD-10-CM) - Hemiplegia and hemiparesis following cerebral infarction affecting right dominant side   THERAPY DIAG:  Unsteadiness on feet  Difficulty in walking, not elsewhere classified  Muscle weakness (generalized)  Other lack of coordination  Left-sided weakness  Rationale for Evaluation and Treatment Rehabilitation  SUBJECTIVE:  SUBJECTIVE STATEMENT Pt is doing well today, reports no significant changes since last session.  Pt accompanied by: family member/caregiver  PERTINENT HISTORY:  The pt is a pleasant 57 yo male who returns to PT for evaluation of deficits s/o old CVA. Pt with hx of CVA Feb 2018. He was previously fully independent, but CVA caused significant deficits, mainly affecting his right side. Pt reports continued difficulty with walking and transfers. Pt still using WC for majority of mobility, otherwise he uses his RW. Pt lives with his mom who is his main caregiver. Patient had outpatient PT previously in clinic in 2023. He reports since discharge he has been completing his HEP and walking about every day. Pt would like to improve his walking mainly, but also would like to work on balance. He also reports difficulty with transfers. Pt's mom and his caregiver helps him with ADLs. However, he says he is now waiting for a new caregiver. Prior to this pt had caregiver 3x/week. PMH significant for hx of brain tumor, past CVA, DMII and HTN. See chart for additional information.  PAIN:  Are you having pain? Pt reports no pain currently  PRECAUTIONS: fall  WEIGHT BEARING RESTRICTIONS none  PLOF: required assistance with ADLs (old CVA),  prior to CVA in 2018 was indep.  History of Falls: Pt reports no recent falls  Home information: Pt lives with mother who is his primary caregiver. Previously had home aid, 3x/week but reports currently waiting for new one.  PATIENT GOALS: Pt would like to improve his ability to ambulate.  OBJECTIVE:   DIAGNOSTIC FINDINGS: No recent imaging per chart  Objective measures taken at eval unless otherwise specified:   Posture: forward head posture, rounded shoulders  MMT LE: grossly  4+/5, exception hip flexors and abductors 4/5 each bilat   Functional tests:  5xSTS:  34 sec, use of BUE (multiple initial attempts with posterior LOB in chair)   : 0.096 m/s with RW, close CGA, and WC follow  Gait for distance (goal 150 ft): deferred due to time  Gait mechanics: impaired, decreased speed (see ), crouched posture, poor postural stability and relies heavily on BUE support on RW. Pt now with bilat AFOS   FOTO: 44 (goal 50)    TODAY'S TREATMENT: 10/17/22 Unless otherwise stated, CGA was provided and gait belt donned in order to ensure pt safety.  TherEx:  -Stand pivot transfer with RW <> arm chair  x 2 and min assist from PT for safety. -Gait x 15 ft with turn halfway through, turn is slow, cues given for increased foot clearance and foot placement. -Standing reciprocal march 1 x 10 bil  -STS x 6 cues for tall posture once standing on each rep  Following exercises done seated in WC or arm chair: -LAQ 2 x 10 ea side with 3 sec holds -Hip ADD with bolster 3-4 sec holds 2 x 10  -Seated hamstring curls, with YTB resistance, 2 x 10 -LE push down on yellow dynadisc 2 x 10 each LE -Hip ABD with YTB 2 x 15  Note RLE is unable to hold the same duration, quickly fatigues compared to LLE   PATIENT EDUCATION: Education details: Exam findings, indications, plan, goals  Person educated: Patient Education method: Explanation, Demonstration, Tactile cues, and Verbal  cues Education comprehension: verbalized understanding, returned demonstration, and verbal cues required  HOME EXERCISE PROGRAM:  Reviewed HEP in session but unable to issue printout today due to printer error - will provide printout next visit  Access Code: [email or web address] URL: [email or web address] Date: 10/01/2022 Prepared by: Temple Pacini  Exercises - Seated March  - 1 x daily - 6 x weekly - 3 sets - 10 reps - Seated Hip Abduction  - 1 x daily - 6 x weekly - 2 sets - 10 reps  GOALS: Goals reviewed with patient? No  SHORT TERM GOALS: Target date: 11/12/2022  Pt will be independent with HEP in order to improve strength and balance in order to decrease fall risk and improve function at home. Baseline: instructed in HEP today, to provide printout next visit Goal status: NEW    LONG TERM GOALS: Target date: 12/24/2022  Pt will increase FOTO to at least 50 in order to demonstrate improvement in function related to mobility and QoL. Baseline: 44 Goal status: NEW  2.  Pt (< 60 yrs old) will complete 5xSTS test < 30 seconds indicating an increase in LE strength and improved balance. Baseline: 10/01/22 34 sec (multiple initial attempts with posterior LOB in chair) 12/20: 49 seconds  Goal status: NEW  3.  Pt will increase to > 0.5 m/s (30 sec) as to improve gait speed for better home ambulation and reduce fall risk. Baseline: 10/01/22 0.096 m/s with RW, close CGA, and WC follow previously 12/27: 0.11 m/s Goal status: NEW   4.  Pt will increase hip flexor and abductor  gross strength to 4+/5 bilat as to improve functional strength for independent gait, increased standing tolerance, and increased ADL ability. Baseline: 10/01/22 4+/5, exception hip flexors and abductors 4/5 each bilat  (previously 03/27/22: 4+/5 ) Goal status: NEW  5.  Pt will ambulate 150 ft with BRW and WC follow with no rest breaks to allow for improved mobility within home and increased  independence. Baseline: 10/03/22 150' with BRW 12/27: 105 ft with BRW  Goal status: NEW    ASSESSMENT :  CLINICAL IMPRESSION: Patient appeared motivated and ready for treatment on this day. Pt preformed well, improving seated hip abduction by adding resistance now. Also preformed well with global increase in repetitions preformed today. Pt required 2-3 attempts in order to stand at start of session, requiring min a to complete sit to stand. No instance of knee instability or LOB while in standing on this day. Will continue to  benefit from focused strengthening of hip abductors, hamstrings, and hip extensors. Pt will continue to benefit from skilled therapy to address remaining deficits in order to improve overall QoL and return to PLOF.      OBJECTIVE IMPAIRMENTS Abnormal gait, decreased activity tolerance, decreased balance, decreased coordination, decreased endurance, decreased mobility, difficulty walking, decreased ROM, decreased strength, improper body mechanics, postural dysfunction, and obesity.   ACTIVITY LIMITATIONS carrying, lifting, bending, sitting, standing, squatting, stairs, transfers, bed mobility, continence, bathing, toileting, dressing, hygiene/grooming, and locomotion level  PARTICIPATION LIMITATIONS: meal prep, cleaning, laundry, interpersonal relationship, driving, shopping, community activity, and yard work  PERSONAL FACTORS Fitness, Past/current experiences, Time since onset of injury/illness/exacerbation, and 3+ comorbidities: hx of brain tumor, past CVA, HTN, obesity, learning disability, DMII  are also affecting patient's functional outcome.   REHAB POTENTIAL: Fair    CLINICAL DECISION MAKING: Stable/uncomplicated  EVALUATION COMPLEXITY: Low  PLAN: PT FREQUENCY: 2x/week  PT DURATION: 12 weeks  PLANNED INTERVENTIONS: Therapeutic exercises, Therapeutic activity, Neuromuscular re-education, Balance training, Gait training, Patient/Family education, Joint  mobilization, Stair training, Vestibular training, Orthotic/Fit training, DME instructions, Dry Needling, Electrical stimulation, Wheelchair mobility training, Cryotherapy, Moist heat, Compression bandaging, Manual therapy, and Re-evaluation  PLAN FOR  NEXT SESSION:   continue with strengthening exercises of the LE's,  functional ambulation when appropriate.   Cecile Sheerer, SPT   This entire session was performed under direct supervision and direction of a licensed therapist/therapist assistant . I have personally read, edited and approve of the note as written.    This licensed clinician was present and actively directing care throughout the session at all times.  Norman Herrlich PT ,DPT Physical Therapist- [facility]   11:01 AM 10/17/22

## 2022-10-21 ENCOUNTER — Ambulatory Visit: Payer: Medicare Other

## 2022-10-21 ENCOUNTER — Ambulatory Visit: Payer: 59 | Admitting: Physical Therapy

## 2022-10-21 DIAGNOSIS — R2681 Unsteadiness on feet: Secondary | ICD-10-CM

## 2022-10-21 DIAGNOSIS — R296 Repeated falls: Secondary | ICD-10-CM

## 2022-10-21 DIAGNOSIS — R2689 Other abnormalities of gait and mobility: Secondary | ICD-10-CM

## 2022-10-21 DIAGNOSIS — M6281 Muscle weakness (generalized): Secondary | ICD-10-CM

## 2022-10-21 DIAGNOSIS — R531 Weakness: Secondary | ICD-10-CM

## 2022-10-21 DIAGNOSIS — R262 Difficulty in walking, not elsewhere classified: Secondary | ICD-10-CM

## 2022-10-21 DIAGNOSIS — R278 Other lack of coordination: Secondary | ICD-10-CM

## 2022-10-21 DIAGNOSIS — R269 Unspecified abnormalities of gait and mobility: Secondary | ICD-10-CM

## 2022-10-21 NOTE — Therapy (Signed)
OUTPATIENT PHYSICAL THERAPY NEURO TREATMENT   Patient Name: Curtis Powell MRN: [id number] DOB:02/09/1966, 57 y.o., male Today's Date: 10/21/2022  PCP: Enid Baas, MD  REFERRING PROVIDER: Enid Baas, MD        Past Medical History:  Diagnosis Date   Asthma    GERD (gastroesophageal reflux disease)    Hyperlipidemia    Hypertension    Obesity    Stroke [facility])    Past Surgical History:  Procedure Laterality Date   BRAIN SURGERY     Patient Active Problem List   Diagnosis Date Noted   Impaired glucose tolerance 03/23/2020   Learning disability 03/23/2020   Urinary and fecal incontinence 06/14/2019   Chronic pain syndrome 03/23/2019   Constipation 03/23/2019   Dysarthria 03/23/2019   Dysphagia 03/23/2019   Learning difficulty 03/23/2019   Morbid obesity (HCC) 03/23/2019   Muscle weakness 03/23/2019   Vitamin D deficiency 03/23/2019   Bilateral carotid artery stenosis 12/15/2018   Moderate aortic valve stenosis 12/15/2018   BMI 45.0-49.9, adult (HCC) 11/10/2018   Cerebrovascular accident (HCC) 05/02/2016   Hemiparesis affecting right side as late effect of cerebrovascular accident (HCC) 05/01/2016   Brain tumor (HCC) 11/24/2014   Gastro-esophageal reflux disease without esophagitis 11/24/2014   Accident due to mechanical fall without injury 08/03/2014   Essential hypertension 06/13/2014   Mixed hyperlipidemia 06/13/2014   Unspecified sequelae of cerebral infarction 06/13/2014   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/2014   Type 2 diabetes mellitus without complication (HCC) 01/06/2014   Neck mass 09/08/2013   Swimmer's ear 09/08/2013   Erectile dysfunction 01/03/2013   Prediabetes 01/03/2013   ONSET DATE: 05/09/2016  REFERRING DIAG: W09.811 (ICD-10-CM) - Hemiplegia and hemiparesis following cerebral infarction affecting right dominant side   THERAPY DIAG:  No diagnosis found.  Rationale for Evaluation and Treatment Rehabilitation  SUBJECTIVE:                                                                                                                                                                                              SUBJECTIVE STATEMENT Pt is doing well today, reports no significant changes since last session. Pt was able to go to rest room independently prior to therapy session without the assistance of an aid.   Pt accompanied by: family member/caregiver  PERTINENT HISTORY:  The pt is a pleasant 57 yo male who returns to PT for evaluation of deficits s/o old CVA. Pt with hx of CVA Feb 2018. He was previously fully independent, but CVA caused significant deficits, mainly affecting his right side. Pt reports continued difficulty with walking and transfers. Pt still using WC for majority of  mobility, otherwise he uses his RW. Pt lives with his mom who is his main caregiver. Patient had outpatient PT previously in clinic in 2023. He reports since discharge he has been completing his HEP and walking about every day. Pt would like to improve his walking mainly, but also would like to work on balance. He also reports difficulty with transfers. Pt's mom and his caregiver helps him with ADLs. However, he says he is now waiting for a new caregiver. Prior to this pt had caregiver 3x/week. PMH significant for hx of brain tumor, past CVA, DMII and HTN. See chart for additional information.  PAIN:  Are you having pain? Pt reports no pain currently  PRECAUTIONS: fall  WEIGHT BEARING RESTRICTIONS none  PLOF: required assistance with ADLs (old CVA), prior to CVA in 2018 was indep.  History of Falls: Pt reports no recent falls  Home information: Pt lives with mother who is his primary caregiver. Previously had home aid, 3x/week but reports currently waiting for new one.  PATIENT GOALS: Pt would like to improve his ability to ambulate.  OBJECTIVE:   DIAGNOSTIC FINDINGS: No recent imaging per chart  Objective measures taken at eval unless otherwise  specified:   Posture: forward head posture, rounded shoulders  MMT LE: grossly  4+/5, exception hip flexors and abductors 4/5 each bilat   Functional tests:  5xSTS:  34 sec, use of BUE (multiple initial attempts with posterior LOB in chair)   : 0.096 m/s with RW, close CGA, and WC follow  Gait for distance (goal 150 ft): deferred due to time  Gait mechanics: impaired, decreased speed (see ), crouched posture, poor postural stability and relies heavily on BUE support on RW. Pt now with bilat AFOS   FOTO: 44 (goal 50)    TODAY'S TREATMENT: 10/21/22 Unless otherwise stated, CGA was provided and gait belt donned in order to ensure pt safety.  TherEx:  Nustep reciprocal endurance training x 5 min level 4 BUE/BLE and 1 min level 3 BLE only.   Gait with RW for endurance training and functional strengthening x 69ft and 17ft. Pt noted to have increased foot drag on the LLE with fatigue. Min cues for posture and step length intermittently. PT provided pt with WC follow and CGA as listed above.   Sit<>stand 3sets x 5 reps CGA-min assist for anterior weight shift and to stabilize the chair and prevent posterior LOB.   Pt required prolonged therapeutic rest break after each bout of therapeutic exercise. Due to BLE fatigue. Pt reports RPE 4-6 following each activity requesting water following gait and nustep endurance training.   PATIENT EDUCATION: Education details: Exam findings, indications, plan, goals  Person educated: Patient Education method: Explanation, Demonstration, Tactile cues, and Verbal cues Education comprehension: verbalized understanding, returned demonstration, and verbal cues required  HOME EXERCISE PROGRAM:  Reviewed HEP in session but unable to issue printout today due to printer error - will provide printout next visit  Access Code: [email or web address] URL: [email or web address] Date: 10/01/2022 Prepared by: Temple Pacini  Exercises - Seated March   - 1 x daily - 6 x weekly - 3 sets - 10 reps - Seated Hip Abduction  - 1 x daily - 6 x weekly - 2 sets - 10 reps  GOALS: Goals reviewed with patient? No  SHORT TERM GOALS: Target date: 11/12/2022  Pt will be independent with HEP in order to improve strength and balance in order to decrease fall risk and improve function at home. Baseline: instructed in  HEP today, to provide printout next visit Goal status: NEW    LONG TERM GOALS: Target date: 12/24/2022  Pt will increase FOTO to at least 50 in order to demonstrate improvement in function related to mobility and QoL. Baseline: 44 Goal status: NEW  2.  Pt (< 56 yrs old) will complete 5xSTS test < 30 seconds indicating an increase in LE strength and improved balance. Baseline: 10/01/22 34 sec (multiple initial attempts with posterior LOB in chair) 12/20: 49 seconds  Goal status: NEW  3.  Pt will increase to > 0.5 m/s (30 sec) as to improve gait speed for better home ambulation and reduce fall risk. Baseline: 10/01/22 0.096 m/s with RW, close CGA, and WC follow previously 12/27: 0.11 m/s Goal status: NEW   4.  Pt will increase hip flexor and abductor  gross strength to 4+/5 bilat as to improve functional strength for independent gait, increased standing tolerance, and increased ADL ability. Baseline: 10/01/22 4+/5, exception hip flexors and abductors 4/5 each bilat  (previously 03/27/22: 4+/5 ) Goal status: NEW  5.  Pt will ambulate 150 ft with BRW and WC follow with no rest breaks to allow for improved mobility within home and increased independence. Baseline: 10/03/22 150' with BRW 12/27: 105 ft with BRW  Goal status: NEW    ASSESSMENT :  CLINICAL IMPRESSION: Patient appeared motivated and ready for treatment on this day. Blocked practice transfer training requiring assist from PT for improved anterior weight shift with min as well for head hips relationship in transfer. Pt able to progress to ambulate >61ft on this day prior to  requiring rest break due to BLE fatige. No knee instability throughout session. Will continue to  benefit from focused strengthening of hip abductors, hamstrings, and hip extensors. Pt will continue to benefit from skilled therapy to address remaining deficits in order to improve overall QoL and return to PLOF.      OBJECTIVE IMPAIRMENTS Abnormal gait, decreased activity tolerance, decreased balance, decreased coordination, decreased endurance, decreased mobility, difficulty walking, decreased ROM, decreased strength, improper body mechanics, postural dysfunction, and obesity.   ACTIVITY LIMITATIONS carrying, lifting, bending, sitting, standing, squatting, stairs, transfers, bed mobility, continence, bathing, toileting, dressing, hygiene/grooming, and locomotion level  PARTICIPATION LIMITATIONS: meal prep, cleaning, laundry, interpersonal relationship, driving, shopping, community activity, and yard work  PERSONAL FACTORS Fitness, Past/current experiences, Time since onset of injury/illness/exacerbation, and 3+ comorbidities: hx of brain tumor, past CVA, HTN, obesity, learning disability, DMII  are also affecting patient's functional outcome.   REHAB POTENTIAL: Fair    CLINICAL DECISION MAKING: Stable/uncomplicated  EVALUATION COMPLEXITY: Low  PLAN: PT FREQUENCY: 2x/week  PT DURATION: 12 weeks  PLANNED INTERVENTIONS: Therapeutic exercises, Therapeutic activity, Neuromuscular re-education, Balance training, Gait training, Patient/Family education, Joint mobilization, Stair training, Vestibular training, Orthotic/Fit training, DME instructions, Dry Needling, Electrical stimulation, Wheelchair mobility training, Cryotherapy, Moist heat, Compression bandaging, Manual therapy, and Re-evaluation  PLAN FOR NEXT SESSION:   continue with strengthening exercises of the LE's,  functional ambulation when appropriate.    Golden Pop PT ,DPT Physical Therapist- [place]  [facility]   8:45 AM 10/21/22

## 2022-10-23 ENCOUNTER — Ambulatory Visit: Payer: Medicare Other

## 2022-10-24 ENCOUNTER — Ambulatory Visit: Payer: 59

## 2022-10-24 DIAGNOSIS — R2681 Unsteadiness on feet: Secondary | ICD-10-CM | POA: Diagnosis not present

## 2022-10-24 DIAGNOSIS — M6281 Muscle weakness (generalized): Secondary | ICD-10-CM

## 2022-10-24 DIAGNOSIS — R278 Other lack of coordination: Secondary | ICD-10-CM

## 2022-10-24 DIAGNOSIS — R262 Difficulty in walking, not elsewhere classified: Secondary | ICD-10-CM

## 2022-10-24 NOTE — Therapy (Signed)
OUTPATIENT PHYSICAL THERAPY NEURO TREATMENT   Patient Name: Curtis Powell MRN: [id number] DOB:May 21, 1965, 57 y.o., male Today's Date: 10/24/2022  PCP: Enid Baas, MD  REFERRING PROVIDER: Enid Baas, MD    PT End of Session - 10/24/22 1922     Visit Number 7    Number of Visits 25    Date for PT Re-Evaluation 12/24/22    PT Start Time 1020    PT Stop Time 1059    PT Time Calculation (min) 39 min    Equipment Utilized During Treatment Gait belt    Activity Tolerance Patient tolerated treatment well    Behavior During Therapy WFL for tasks assessed/performed                Past Medical History:  Diagnosis Date   Asthma    GERD (gastroesophageal reflux disease)    Hyperlipidemia    Hypertension    Obesity    Stroke [facility])    Past Surgical History:  Procedure Laterality Date   BRAIN SURGERY     Patient Active Problem List   Diagnosis Date Noted   Impaired glucose tolerance 03/23/2020   Learning disability 03/23/2020   Urinary and fecal incontinence 06/14/2019   Chronic pain syndrome 03/23/2019   Constipation 03/23/2019   Dysarthria 03/23/2019   Dysphagia 03/23/2019   Learning difficulty 03/23/2019   Morbid obesity (HCC) 03/23/2019   Muscle weakness 03/23/2019   Vitamin D deficiency 03/23/2019   Bilateral carotid artery stenosis 12/15/2018   Moderate aortic valve stenosis 12/15/2018   BMI 45.0-49.9, adult (HCC) 11/10/2018   Cerebrovascular accident (HCC) 05/02/2016   Hemiparesis affecting right side as late effect of cerebrovascular accident (HCC) 05/01/2016   Brain tumor (HCC) 11/24/2014   Gastro-esophageal reflux disease without esophagitis 11/24/2014   Accident due to mechanical fall without injury 08/03/2014   Essential hypertension 06/13/2014   Mixed hyperlipidemia 06/13/2014   Unspecified sequelae of cerebral infarction 06/13/2014   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/2014   Type 2 diabetes mellitus without complication (HCC) 01/06/2014   Neck mass  09/08/2013   Swimmer's ear 09/08/2013   Erectile dysfunction 01/03/2013   Prediabetes 01/03/2013   ONSET DATE: 05/09/2016  REFERRING DIAG: K44.010 (ICD-10-CM) - Hemiplegia and hemiparesis following cerebral infarction affecting right dominant side   THERAPY DIAG:  Muscle weakness (generalized)  Difficulty in walking, not elsewhere classified  Unsteadiness on feet  Other lack of coordination  Rationale for Evaluation and Treatment Rehabilitation  SUBJECTIVE:  SUBJECTIVE STATEMENT Pt reports no pain, no falls, and reports HEP is going well at home.   Pt accompanied by: family member/caregiver  PERTINENT HISTORY:  The pt is a pleasant 57 yo male who returns to PT for evaluation of deficits s/o old CVA. Pt with hx of CVA Feb 2018. He was previously fully independent, but CVA caused significant deficits, mainly affecting his right side. Pt reports continued difficulty with walking and transfers. Pt still using WC for majority of mobility, otherwise he uses his RW. Pt lives with his mom who is his main caregiver. Patient had outpatient PT previously in clinic in 2023. He reports since discharge he has been completing his HEP and walking about every day. Pt would like to improve his walking mainly, but also would like to work on balance. He also reports difficulty with transfers. Pt's mom and his caregiver helps him with ADLs. However, he says he is now waiting for a new caregiver. Prior to this pt had caregiver 3x/week. PMH significant for hx of brain tumor, past CVA, DMII and HTN. See chart for additional information.  PAIN:  Are you having pain? Pt reports no pain currently  PRECAUTIONS: fall  WEIGHT BEARING RESTRICTIONS none  PLOF: required assistance with ADLs (old CVA), prior to CVA in 2018 was  indep.  History of Falls: Pt reports no recent falls  Home information: Pt lives with mother who is his primary caregiver. Previously had home aid, 3x/week but reports currently waiting for new one.  PATIENT GOALS: Pt would like to improve his ability to ambulate.  OBJECTIVE:   DIAGNOSTIC FINDINGS: No recent imaging per chart  Objective measures taken at eval unless otherwise specified:   Posture: forward head posture, rounded shoulders  MMT LE: grossly  4+/5, exception hip flexors and abductors 4/5 each bilat   Functional tests:  5xSTS:  34 sec, use of BUE (multiple initial attempts with posterior LOB in chair)   : 0.096 m/s with RW, close CGA, and WC follow  Gait for distance (goal 150 ft): deferred due to time  Gait mechanics: impaired, decreased speed (see ), crouched posture, poor postural stability and relies heavily on BUE support on RW. Pt now with bilat AFOS   FOTO: 44 (goal 50)    TODAY'S TREATMENT: 10/24/22 Unless otherwise stated, CGA was provided and gait belt donned in order to ensure pt safety.  TherEx:  Gait with RW, WC follow, close CGA x 14 ft to nustep from [facility], functional strengthening   Nustep reciprocal endurance training, BUE/BLE. PT adjusts intensity throughout, cues pt for speed and monitors pt for response to intervention. Pt maintains SPM 40s-50s. Lvl x 1 min - warm up Lvl 4 x 2 min  Lvl 5 x 1 min - rates medium Lvl 3 x 1 min Lvl 1 x 1 min   Gait with RW for endurance training and functional strengthening x 73ft. Use of WC follow, close CGA. Pt reports BLE fatigue at end of intervention and rates it as medium.  LAQ 2x10 each LE Seated march 2x10 each LE   Postural exercise in chair: RTB rows 2x12    PATIENT EDUCATION: Education details: Pt educated throughout session about proper posture and technique with exercises. Improved exercise technique, movement at target joints, use of target muscles after min to mod verbal,  visual, tactile cues.   Person educated: Patient Education method: Explanation, Demonstration, Tactile cues, and Verbal cues Education comprehension: verbalized understanding, returned demonstration, and verbal cues required  HOME  EXERCISE PROGRAM:  Reviewed HEP in session but unable to issue printout today due to printer error - will provide printout next visit  Access Code: [email or web address] URL: [email or web address] Date: 10/01/2022 Prepared by: Temple Pacini  Exercises - Seated March  - 1 x daily - 6 x weekly - 3 sets - 10 reps - Seated Hip Abduction  - 1 x daily - 6 x weekly - 2 sets - 10 reps  GOALS: Goals reviewed with patient? No  SHORT TERM GOALS: Target date: 11/12/2022  Pt will be independent with HEP in order to improve strength and balance in order to decrease fall risk and improve function at home. Baseline: instructed in HEP today, to provide printout next visit Goal status: NEW    LONG TERM GOALS: Target date: 12/24/2022  Pt will increase FOTO to at least 50 in order to demonstrate improvement in function related to mobility and QoL. Baseline: 44 Goal status: NEW  2.  Pt (< 60 yrs old) will complete 5xSTS test < 30 seconds indicating an increase in LE strength and improved balance. Baseline: 10/01/22 34 sec (multiple initial attempts with posterior LOB in chair) 12/20: 49 seconds  Goal status: NEW  3.  Pt will increase to > 0.5 m/s (30 sec) as to improve gait speed for better home ambulation and reduce fall risk. Baseline: 10/01/22 0.096 m/s with RW, close CGA, and WC follow previously 12/27: 0.11 m/s Goal status: NEW   4.  Pt will increase hip flexor and abductor  gross strength to 4+/5 bilat as to improve functional strength for independent gait, increased standing tolerance, and increased ADL ability. Baseline: 10/01/22 4+/5, exception hip flexors and abductors 4/5 each bilat  (previously 03/27/22: 4+/5 ) Goal status: NEW  5.  Pt will ambulate  150 ft with BRW and WC follow with no rest breaks to allow for improved mobility within home and increased independence. Baseline: 10/03/22 150' with BRW 12/27: 105 ft with BRW  Goal status: NEW    ASSESSMENT :  CLINICAL IMPRESSION: Pt able to progress nustep intervention and ambulate for greater distances today with RW, close CGA. This is suggestive of functional carryover between sessions and improved LE strength/endurance. While pt shows improvement, he still exhibits quick onset of fatigue with ambulation. The pt will continue to benefit from skilled therapy to address remaining deficits in order to improve overall QoL and return to PLOF.      OBJECTIVE IMPAIRMENTS Abnormal gait, decreased activity tolerance, decreased balance, decreased coordination, decreased endurance, decreased mobility, difficulty walking, decreased ROM, decreased strength, improper body mechanics, postural dysfunction, and obesity.   ACTIVITY LIMITATIONS carrying, lifting, bending, sitting, standing, squatting, stairs, transfers, bed mobility, continence, bathing, toileting, dressing, hygiene/grooming, and locomotion level  PARTICIPATION LIMITATIONS: meal prep, cleaning, laundry, interpersonal relationship, driving, shopping, community activity, and yard work  PERSONAL FACTORS Fitness, Past/current experiences, Time since onset of injury/illness/exacerbation, and 3+ comorbidities: hx of brain tumor, past CVA, HTN, obesity, learning disability, DMII  are also affecting patient's functional outcome.   REHAB POTENTIAL: Fair    CLINICAL DECISION MAKING: Stable/uncomplicated  EVALUATION COMPLEXITY: Low  PLAN: PT FREQUENCY: 2x/week  PT DURATION: 12 weeks  PLANNED INTERVENTIONS: Therapeutic exercises, Therapeutic activity, Neuromuscular re-education, Balance training, Gait training, Patient/Family education, Joint mobilization, Stair training, Vestibular training, Orthotic/Fit training, DME instructions, Dry  Needling, Electrical stimulation, Wheelchair mobility training, Cryotherapy, Moist heat, Compression bandaging, Manual therapy, and Re-evaluation  PLAN FOR NEXT SESSION:   continue with strengthening exercises of the LE's,  functional  ambulation when appropriate.    Baird Kay PT ,DPT Physical Therapist- [place]  [facility]   7:37 PM 10/24/22

## 2022-10-28 ENCOUNTER — Ambulatory Visit: Payer: 59

## 2022-10-28 ENCOUNTER — Ambulatory Visit: Payer: Medicare Other

## 2022-10-28 DIAGNOSIS — R2681 Unsteadiness on feet: Secondary | ICD-10-CM

## 2022-10-28 DIAGNOSIS — R269 Unspecified abnormalities of gait and mobility: Secondary | ICD-10-CM

## 2022-10-28 DIAGNOSIS — R531 Weakness: Secondary | ICD-10-CM

## 2022-10-28 DIAGNOSIS — R278 Other lack of coordination: Secondary | ICD-10-CM

## 2022-10-28 DIAGNOSIS — R2689 Other abnormalities of gait and mobility: Secondary | ICD-10-CM

## 2022-10-28 DIAGNOSIS — M6281 Muscle weakness (generalized): Secondary | ICD-10-CM

## 2022-10-28 DIAGNOSIS — R262 Difficulty in walking, not elsewhere classified: Secondary | ICD-10-CM

## 2022-10-28 NOTE — Therapy (Signed)
OUTPATIENT PHYSICAL THERAPY NEURO TREATMENT   Patient Name: Curtis Powell MRN: [id number] DOB:1965/11/11, 57 y.o., male Today's Date: 10/29/2022  PCP: Enid Baas, MD  REFERRING PROVIDER: Enid Baas, MD    PT End of Session - 10/28/22 1453     Visit Number 8    Number of Visits 25    Date for PT Re-Evaluation 12/24/22    PT Start Time 1449    PT Stop Time 1529    PT Time Calculation (min) 40 min    Equipment Utilized During Treatment Gait belt    Activity Tolerance Patient tolerated treatment well    Behavior During Therapy WFL for tasks assessed/performed                 Past Medical History:  Diagnosis Date   Asthma    GERD (gastroesophageal reflux disease)    Hyperlipidemia    Hypertension    Obesity    Stroke [facility])    Past Surgical History:  Procedure Laterality Date   BRAIN SURGERY     Patient Active Problem List   Diagnosis Date Noted   Impaired glucose tolerance 03/23/2020   Learning disability 03/23/2020   Urinary and fecal incontinence 06/14/2019   Chronic pain syndrome 03/23/2019   Constipation 03/23/2019   Dysarthria 03/23/2019   Dysphagia 03/23/2019   Learning difficulty 03/23/2019   Morbid obesity (HCC) 03/23/2019   Muscle weakness 03/23/2019   Vitamin D deficiency 03/23/2019   Bilateral carotid artery stenosis 12/15/2018   Moderate aortic valve stenosis 12/15/2018   BMI 45.0-49.9, adult (HCC) 11/10/2018   Cerebrovascular accident (HCC) 05/02/2016   Hemiparesis affecting right side as late effect of cerebrovascular accident (HCC) 05/01/2016   Brain tumor (HCC) 11/24/2014   Gastro-esophageal reflux disease without esophagitis 11/24/2014   Accident due to mechanical fall without injury 08/03/2014   Essential hypertension 06/13/2014   Mixed hyperlipidemia 06/13/2014   Unspecified sequelae of cerebral infarction 06/13/2014   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/2014   Type 2 diabetes mellitus without complication (HCC) 01/06/2014   Neck  mass 09/08/2013   Swimmer's ear 09/08/2013   Erectile dysfunction 01/03/2013   Prediabetes 01/03/2013   ONSET DATE: 05/09/2016  REFERRING DIAG: O96.295 (ICD-10-CM) - Hemiplegia and hemiparesis following cerebral infarction affecting right dominant side   THERAPY DIAG:  Muscle weakness (generalized)  Difficulty in walking, not elsewhere classified  Unsteadiness on feet  Other lack of coordination  Left-sided weakness  Other abnormalities of gait and mobility  Abnormality of gait and mobility  Rationale for Evaluation and Treatment Rehabilitation  SUBJECTIVE:  SUBJECTIVE STATEMENT Patient reports 1 fall since last visit- states fell in the bathroom standing to wash up with caregiver (mother) and states legs just gave out and he fell. Denies any pain- EMS did come and assisted him to his chair. Did not seek emergency department and states doing okay.    Pt accompanied by: family member/caregiver  PERTINENT HISTORY:  The pt is a pleasant 57 yo male who returns to PT for evaluation of deficits s/o old CVA. Pt with hx of CVA Feb 2018. He was previously fully independent, but CVA caused significant deficits, mainly affecting his right side. Pt reports continued difficulty with walking and transfers. Pt still using WC for majority of mobility, otherwise he uses his RW. Pt lives with his mom who is his main caregiver. Patient had outpatient PT previously in clinic in 2023. He reports since discharge he has been completing his HEP and walking about every day. Pt would like to improve his walking mainly, but also would like to work on balance. He also reports difficulty with transfers. Pt's mom and his caregiver helps him with ADLs. However, he says he is now waiting for a new caregiver. Prior to this pt had  caregiver 3x/week. PMH significant for hx of brain tumor, past CVA, DMII and HTN. See chart for additional information.  PAIN:  Are you having pain? Pt reports no pain currently  PRECAUTIONS: fall  WEIGHT BEARING RESTRICTIONS none  PLOF: required assistance with ADLs (old CVA), prior to CVA in 2018 was indep.  History of Falls: Pt reports no recent falls  Home information: Pt lives with mother who is his primary caregiver. Previously had home aid, 3x/week but reports currently waiting for new one.  PATIENT GOALS: Pt would like to improve his ability to ambulate.  OBJECTIVE:   DIAGNOSTIC FINDINGS: No recent imaging per chart  Objective measures taken at eval unless otherwise specified:   Posture: forward head posture, rounded shoulders  MMT LE: grossly  4+/5, exception hip flexors and abductors 4/5 each bilat   Functional tests:  5xSTS:  34 sec, use of BUE (multiple initial attempts with posterior LOB in chair)   : 0.096 m/s with RW, close CGA, and WC follow  Gait for distance (goal 150 ft): deferred due to time  Gait mechanics: impaired, decreased speed (see ), crouched posture, poor postural stability and relies heavily on BUE support on RW. Pt now with bilat AFOS   FOTO: 44 (goal 50)    TODAY'S TREATMENT:    Unless otherwise stated, CGA was provided and gait belt donned in order to ensure pt safety.  TherEx:  Gait in // bars, close CGA x 10 feet then focused on retro  steps back to w/c for  functional strengthening and improving ability to walk backward for transfers x 2 trials- counted steps on second trial- 19 steps forward and 27 steps backward.     Postural exercise in standing: Static standing with UE Support in // bars x 60 sec hold x2 (using mirror as feedback for erect standing)  Standing at side of // bars (close CGA to stand unsupported) -RTB rows 2x12  NMR: in // bars with CGA and use of gait belt.  Static standing without UE Support   in// bars x 60 sec x 2 trials. Dynamic ant/post weight shift in // bars x several trials - Difficulty with several LOB- mostly posteriorly- requiring Physical assist to keep from falling backward.    PATIENT EDUCATION: Education details: Pt educated  throughout session about proper posture and technique with exercises. Improved exercise technique, movement at target joints, use of target muscles after min to mod verbal, visual, tactile cues.   Person educated: Patient Education method: Explanation, Demonstration, Tactile cues, and Verbal cues Education comprehension: verbalized understanding, returned demonstration, and verbal cues required  HOME EXERCISE PROGRAM:  Reviewed HEP in session but unable to issue printout today due to printer error - will provide printout next visit  Access Code: [email or web address] URL: [email or web address] Date: 10/01/2022 Prepared by: Temple Pacini  Exercises - Seated March  - 1 x daily - 6 x weekly - 3 sets - 10 reps - Seated Hip Abduction  - 1 x daily - 6 x weekly - 2 sets - 10 reps  GOALS: Goals reviewed with patient? No  SHORT TERM GOALS: Target date: 11/12/2022  Pt will be independent with HEP in order to improve strength and balance in order to decrease fall risk and improve function at home. Baseline: instructed in HEP today, to provide printout next visit Goal status: NEW    LONG TERM GOALS: Target date: 12/24/2022  Pt will increase FOTO to at least 50 in order to demonstrate improvement in function related to mobility and QoL. Baseline: 44 Goal status: NEW  2.  Pt (< 60 yrs old) will complete 5xSTS test < 30 seconds indicating an increase in LE strength and improved balance. Baseline: 10/01/22 34 sec (multiple initial attempts with posterior LOB in chair) 12/20: 49 seconds  Goal status: NEW  3.  Pt will increase to > 0.5 m/s (30 sec) as to improve gait speed for better home ambulation and reduce fall risk. Baseline: 10/01/22  0.096 m/s with RW, close CGA, and WC follow previously 12/27: 0.11 m/s Goal status: NEW   4.  Pt will increase hip flexor and abductor  gross strength to 4+/5 bilat as to improve functional strength for independent gait, increased standing tolerance, and increased ADL ability. Baseline: 10/01/22 4+/5, exception hip flexors and abductors 4/5 each bilat  (previously 03/27/22: 4+/5 ) Goal status: NEW  5.  Pt will ambulate 150 ft with BRW and WC follow with no rest breaks to allow for improved mobility within home and increased independence. Baseline: 10/03/22 150' with BRW 12/27: 105 ft with BRW  Goal status: NEW    ASSESSMENT :  CLINICAL IMPRESSION: Treatment focused on standing balance and posture this visit. Patient benefited from use of // bars and mirrors to attempt to stand as erect as possible and hold position. He was unaware of how forward flexed he stands but able to improve overall posture with VC and visual feedback of mirror today. Patient will continue to benefit from skilled therapy to address remaining deficits in order to improve overall QoL and return to PLOF.      OBJECTIVE IMPAIRMENTS Abnormal gait, decreased activity tolerance, decreased balance, decreased coordination, decreased endurance, decreased mobility, difficulty walking, decreased ROM, decreased strength, improper body mechanics, postural dysfunction, and obesity.   ACTIVITY LIMITATIONS carrying, lifting, bending, sitting, standing, squatting, stairs, transfers, bed mobility, continence, bathing, toileting, dressing, hygiene/grooming, and locomotion level  PARTICIPATION LIMITATIONS: meal prep, cleaning, laundry, interpersonal relationship, driving, shopping, community activity, and yard work  PERSONAL FACTORS Fitness, Past/current experiences, Time since onset of injury/illness/exacerbation, and 3+ comorbidities: hx of brain tumor, past CVA, HTN, obesity, learning disability, DMII  are also affecting patient's  functional outcome.   REHAB POTENTIAL: Fair    CLINICAL DECISION MAKING: Stable/uncomplicated  EVALUATION COMPLEXITY: Low  PLAN: PT  FREQUENCY: 2x/week  PT DURATION: 12 weeks  PLANNED INTERVENTIONS: Therapeutic exercises, Therapeutic activity, Neuromuscular re-education, Balance training, Gait training, Patient/Family education, Joint mobilization, Stair training, Vestibular training, Orthotic/Fit training, DME instructions, Dry Needling, Electrical stimulation, Wheelchair mobility training, Cryotherapy, Moist heat, Compression bandaging, Manual therapy, and Re-evaluation  PLAN FOR NEXT SESSION:   continue with strengthening exercises of the LE's,  functional ambulation when appropriate.    Lenda Kelp PT Physical Therapist- [place]  [facility]   8:56 AM 10/29/22

## 2022-10-30 ENCOUNTER — Ambulatory Visit: Payer: Medicare Other

## 2022-10-31 ENCOUNTER — Ambulatory Visit: Payer: 59

## 2022-10-31 DIAGNOSIS — M6281 Muscle weakness (generalized): Secondary | ICD-10-CM

## 2022-10-31 DIAGNOSIS — R2681 Unsteadiness on feet: Secondary | ICD-10-CM | POA: Diagnosis not present

## 2022-10-31 DIAGNOSIS — R262 Difficulty in walking, not elsewhere classified: Secondary | ICD-10-CM

## 2022-10-31 DIAGNOSIS — R278 Other lack of coordination: Secondary | ICD-10-CM

## 2022-10-31 NOTE — Therapy (Signed)
OUTPATIENT PHYSICAL THERAPY NEURO TREATMENT   Patient Name: Curtis Powell MRN: [id number] DOB:1965-12-15, 57 y.o., male Today's Date: 10/31/2022  PCP: Enid Baas, MD  REFERRING PROVIDER: Enid Baas, MD    PT End of Session - 10/31/22 1103     Visit Number 9    Number of Visits 25    Date for PT Re-Evaluation 12/24/22    PT Start Time 1102    PT Stop Time 1145    PT Time Calculation (min) 43 min    Equipment Utilized During Treatment Gait belt    Activity Tolerance Patient tolerated treatment well    Behavior During Therapy WFL for tasks assessed/performed                  Past Medical History:  Diagnosis Date   Asthma    GERD (gastroesophageal reflux disease)    Hyperlipidemia    Hypertension    Obesity    Stroke [facility])    Past Surgical History:  Procedure Laterality Date   BRAIN SURGERY     Patient Active Problem List   Diagnosis Date Noted   Impaired glucose tolerance 03/23/2020   Learning disability 03/23/2020   Urinary and fecal incontinence 06/14/2019   Chronic pain syndrome 03/23/2019   Constipation 03/23/2019   Dysarthria 03/23/2019   Dysphagia 03/23/2019   Learning difficulty 03/23/2019   Morbid obesity (HCC) 03/23/2019   Muscle weakness 03/23/2019   Vitamin D deficiency 03/23/2019   Bilateral carotid artery stenosis 12/15/2018   Moderate aortic valve stenosis 12/15/2018   BMI 45.0-49.9, adult (HCC) 11/10/2018   Cerebrovascular accident (HCC) 05/02/2016   Hemiparesis affecting right side as late effect of cerebrovascular accident (HCC) 05/01/2016   Brain tumor (HCC) 11/24/2014   Gastro-esophageal reflux disease without esophagitis 11/24/2014   Accident due to mechanical fall without injury 08/03/2014   Essential hypertension 06/13/2014   Mixed hyperlipidemia 06/13/2014   Unspecified sequelae of cerebral infarction 06/13/2014   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/2014   Type 2 diabetes mellitus without complication (HCC) 01/06/2014   Neck  mass 09/08/2013   Swimmer's ear 09/08/2013   Erectile dysfunction 01/03/2013   Prediabetes 01/03/2013   ONSET DATE: 05/09/2016  REFERRING DIAG: X32.440 (ICD-10-CM) - Hemiplegia and hemiparesis following cerebral infarction affecting right dominant side   THERAPY DIAG:  Muscle weakness (generalized)  Difficulty in walking, not elsewhere classified  Other lack of coordination  Rationale for Evaluation and Treatment Rehabilitation  SUBJECTIVE:  SUBJECTIVE STATEMENT Pt reports no pain at the moment and no other falls. No other updates/concerns  Pt accompanied by: family member/caregiver  PERTINENT HISTORY:  The pt is a pleasant 57 yo male who returns to PT for evaluation of deficits s/o old CVA. Pt with hx of CVA Feb 2018. He was previously fully independent, but CVA caused significant deficits, mainly affecting his right side. Pt reports continued difficulty with walking and transfers. Pt still using WC for majority of mobility, otherwise he uses his RW. Pt lives with his mom who is his main caregiver. Patient had outpatient PT previously in clinic in 2023. He reports since discharge he has been completing his HEP and walking about every day. Pt would like to improve his walking mainly, but also would like to work on balance. He also reports difficulty with transfers. Pt's mom and his caregiver helps him with ADLs. However, he says he is now waiting for a new caregiver. Prior to this pt had caregiver 3x/week. PMH significant for hx of brain tumor, past CVA, DMII and HTN. See chart for additional information.  PAIN:  Are you having pain? Pt reports no pain currently  PRECAUTIONS: fall  WEIGHT BEARING RESTRICTIONS none  PLOF: required assistance with ADLs (old CVA), prior to CVA in 2018 was  indep.  History of Falls: Pt reports no recent falls  Home information: Pt lives with mother who is his primary caregiver. Previously had home aid, 3x/week but reports currently waiting for new one.  PATIENT GOALS: Pt would like to improve his ability to ambulate.  OBJECTIVE:   DIAGNOSTIC FINDINGS: No recent imaging per chart  Objective measures taken at eval unless otherwise specified:   Posture: forward head posture, rounded shoulders  MMT LE: grossly  4+/5, exception hip flexors and abductors 4/5 each bilat   Functional tests:  5xSTS:  34 sec, use of BUE (multiple initial attempts with posterior LOB in chair)   : 0.096 m/s with RW, close CGA, and WC follow  Gait for distance (goal 150 ft): deferred due to time  Gait mechanics: impaired, decreased speed (see ), crouched posture, poor postural stability and relies heavily on BUE support on RW. Pt now with bilat AFOS   FOTO: 44 (goal 50)    TODAY'S TREATMENT:    Unless otherwise stated, CGA was provided and gait belt donned in order to ensure pt safety.  TherEx:  WC hamstring curls 1x10 meters. Pt able to complete slowly, rates medium.  LAQ 1x10 each LE. Rates easy LAQ with 2# AW donned 1x10 Rates medium  Seated march 2x10 each LE with 2# AW donned each LE  STS with push-to-stand technique 4x5 with multiple initial unsuccessful attempts and posterior LOB into chair (PT assists to control stand>sit). Pt requires up to mod assist with exercise   Standing endurance 2x 1 min with BUE support on RW   Postural exercise in chair: RTB ER 1x15, 1x20. Pt rates difficult, reports UE fatigue  RTB rows 1x20 bilat UE    PATIENT EDUCATION: Education details: Pt educated throughout session about proper posture and technique with exercises. Improved exercise technique, movement at target joints, use of target muscles after min to mod verbal, visual, tactile cues.   Person educated: Patient Education method:  Explanation, Demonstration, Tactile cues, and Verbal cues Education comprehension: verbalized understanding, returned demonstration, and verbal cues required  HOME EXERCISE PROGRAM:  Reviewed HEP in session but unable to issue printout today due to printer error - will provide printout  next visit  Access Code: [email or web address] URL: [email or web address] Date: 10/01/2022 Prepared by: Temple Pacini  Exercises - Seated March  - 1 x daily - 6 x weekly - 3 sets - 10 reps - Seated Hip Abduction  - 1 x daily - 6 x weekly - 2 sets - 10 reps  GOALS: Goals reviewed with patient? No  SHORT TERM GOALS: Target date: 11/12/2022  Pt will be independent with HEP in order to improve strength and balance in order to decrease fall risk and improve function at home. Baseline: instructed in HEP today, to provide printout next visit Goal status: NEW    LONG TERM GOALS: Target date: 12/24/2022  Pt will increase FOTO to at least 50 in order to demonstrate improvement in function related to mobility and QoL. Baseline: 44 Goal status: NEW  2.  Pt (< 60 yrs old) will complete 5xSTS test < 30 seconds indicating an increase in LE strength and improved balance. Baseline: 10/01/22 34 sec (multiple initial attempts with posterior LOB in chair) 12/20: 49 seconds  Goal status: NEW  3.  Pt will increase to > 0.5 m/s (30 sec) as to improve gait speed for better home ambulation and reduce fall risk. Baseline: 10/01/22 0.096 m/s with RW, close CGA, and WC follow previously 12/27: 0.11 m/s Goal status: NEW   4.  Pt will increase hip flexor and abductor  gross strength to 4+/5 bilat as to improve functional strength for independent gait, increased standing tolerance, and increased ADL ability. Baseline: 10/01/22 4+/5, exception hip flexors and abductors 4/5 each bilat  (previously 03/27/22: 4+/5 ) Goal status: NEW  5.  Pt will ambulate 150 ft with BRW and WC follow with no rest breaks to allow for improved  mobility within home and increased independence. Baseline: 10/03/22 150' with BRW 12/27: 105 ft with BRW  Goal status: NEW    ASSESSMENT :  CLINICAL IMPRESSION: Pt with excellent motivation to participate in session. He focused on completing LE strengthening exercises. He did exhibit some trouble completing STS, requiring up to mod assist throughout. However, he still as able to complete multiple repetitions of this intervention. Patient will continue to benefit from skilled therapy to address remaining deficits in order to improve overall QoL and return to PLOF.      OBJECTIVE IMPAIRMENTS Abnormal gait, decreased activity tolerance, decreased balance, decreased coordination, decreased endurance, decreased mobility, difficulty walking, decreased ROM, decreased strength, improper body mechanics, postural dysfunction, and obesity.   ACTIVITY LIMITATIONS carrying, lifting, bending, sitting, standing, squatting, stairs, transfers, bed mobility, continence, bathing, toileting, dressing, hygiene/grooming, and locomotion level  PARTICIPATION LIMITATIONS: meal prep, cleaning, laundry, interpersonal relationship, driving, shopping, community activity, and yard work  PERSONAL FACTORS Fitness, Past/current experiences, Time since onset of injury/illness/exacerbation, and 3+ comorbidities: hx of brain tumor, past CVA, HTN, obesity, learning disability, DMII  are also affecting patient's functional outcome.   REHAB POTENTIAL: Fair    CLINICAL DECISION MAKING: Stable/uncomplicated  EVALUATION COMPLEXITY: Low  PLAN: PT FREQUENCY: 2x/week  PT DURATION: 12 weeks  PLANNED INTERVENTIONS: Therapeutic exercises, Therapeutic activity, Neuromuscular re-education, Balance training, Gait training, Patient/Family education, Joint mobilization, Stair training, Vestibular training, Orthotic/Fit training, DME instructions, Dry Needling, Electrical stimulation, Wheelchair mobility training, Cryotherapy, Moist heat,  Compression bandaging, Manual therapy, and Re-evaluation  PLAN FOR NEXT SESSION:   continue with strengthening exercises of the LE's,  functional ambulation when appropriate. Continue plan    Baird Kay PT Physical Therapist- [place]  [facility]  11:49 AM 10/31/22

## 2022-11-04 ENCOUNTER — Ambulatory Visit: Payer: 59 | Admitting: Physical Therapy

## 2022-11-04 DIAGNOSIS — R531 Weakness: Secondary | ICD-10-CM

## 2022-11-04 DIAGNOSIS — R278 Other lack of coordination: Secondary | ICD-10-CM

## 2022-11-04 DIAGNOSIS — R296 Repeated falls: Secondary | ICD-10-CM

## 2022-11-04 DIAGNOSIS — R262 Difficulty in walking, not elsewhere classified: Secondary | ICD-10-CM

## 2022-11-04 DIAGNOSIS — R2689 Other abnormalities of gait and mobility: Secondary | ICD-10-CM

## 2022-11-04 DIAGNOSIS — R2681 Unsteadiness on feet: Secondary | ICD-10-CM | POA: Diagnosis not present

## 2022-11-04 DIAGNOSIS — M6281 Muscle weakness (generalized): Secondary | ICD-10-CM

## 2022-11-04 NOTE — Therapy (Signed)
OUTPATIENT PHYSICAL THERAPY NEURO TREATMENT / Physical Therapy Progress Note   Dates of reporting period  10/01/22   to   11/04/22    Patient Name: Curtis Powell MRN: [id number] DOB:1965-07-21, 57 y.o., male Today's Date: 11/04/2022  PCP: Enid Baas, MD  REFERRING PROVIDER: Enid Baas, MD    PT End of Session - 11/04/22 1020     Visit Number 10    Number of Visits 25    Date for PT Re-Evaluation 12/24/22    PT Start Time 1021    PT Stop Time 1058    PT Time Calculation (min) 37 min    Equipment Utilized During Treatment Gait belt    Activity Tolerance Patient tolerated treatment well    Behavior During Therapy WFL for tasks assessed/performed                   Past Medical History:  Diagnosis Date   Asthma    GERD (gastroesophageal reflux disease)    Hyperlipidemia    Hypertension    Obesity    Stroke [facility])    Past Surgical History:  Procedure Laterality Date   BRAIN SURGERY     Patient Active Problem List   Diagnosis Date Noted   Impaired glucose tolerance 03/23/2020   Learning disability 03/23/2020   Urinary and fecal incontinence 06/14/2019   Chronic pain syndrome 03/23/2019   Constipation 03/23/2019   Dysarthria 03/23/2019   Dysphagia 03/23/2019   Learning difficulty 03/23/2019   Morbid obesity (HCC) 03/23/2019   Muscle weakness 03/23/2019   Vitamin D deficiency 03/23/2019   Bilateral carotid artery stenosis 12/15/2018   Moderate aortic valve stenosis 12/15/2018   BMI 45.0-49.9, adult (HCC) 11/10/2018   Cerebrovascular accident (HCC) 05/02/2016   Hemiparesis affecting right side as late effect of cerebrovascular accident (HCC) 05/01/2016   Brain tumor (HCC) 11/24/2014   Gastro-esophageal reflux disease without esophagitis 11/24/2014   Accident due to mechanical fall without injury 08/03/2014   Essential hypertension 06/13/2014   Mixed hyperlipidemia 06/13/2014   Unspecified sequelae of cerebral infarction 06/13/2014    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/2014   Type 2 diabetes mellitus without complication (HCC) 01/06/2014   Neck mass 09/08/2013   Swimmer's ear 09/08/2013   Erectile dysfunction 01/03/2013   Prediabetes 01/03/2013   ONSET DATE: 05/09/2016  REFERRING DIAG: U72.536 (ICD-10-CM) - Hemiplegia and hemiparesis following cerebral infarction affecting right dominant side   THERAPY DIAG:  Muscle weakness (generalized)  Difficulty in walking, not elsewhere classified  Unsteadiness on feet  Other lack of coordination  Left-sided weakness  Other abnormalities of gait and mobility  Repeated falls  Rationale for Evaluation and Treatment Rehabilitation  SUBJECTIVE:  SUBJECTIVE STATEMENT Pt session was cut a little short today due to pt arriving a few minutes late to scheduled appointment time. Pt reports doing well today. Pt denies any recent falls/stumbles since prior session. Pt denies any updates to medications or medical appointment since prior session. Pt reports good compliance with HEP when time permits. Pt has questions regarding STS which were addressed by PT.  Pt accompanied by: family member/caregiver  PERTINENT HISTORY:  The pt is a pleasant 57 yo male who returns to PT for evaluation of deficits s/o old CVA. Pt with hx of CVA Feb 2018. He was previously fully independent, but CVA caused significant deficits, mainly affecting his right side. Pt reports continued difficulty with walking and transfers. Pt still using WC for majority of mobility, otherwise he uses his RW. Pt lives with his mom who is his main caregiver. Patient had outpatient PT previously in clinic in 2023. He reports since discharge he has been completing his HEP and walking about every day. Pt would like to improve his walking mainly, but also would  like to work on balance. He also reports difficulty with transfers. Pt's mom and his caregiver helps him with ADLs. However, he says he is now waiting for a new caregiver. Prior to this pt had caregiver 3x/week. PMH significant for hx of brain tumor, past CVA, DMII and HTN. See chart for additional information.  PAIN:  Are you having pain? Pt reports no pain currently  PRECAUTIONS: fall  WEIGHT BEARING RESTRICTIONS none  PLOF: required assistance with ADLs (old CVA), prior to CVA in 2018 was indep.  History of Falls: Pt reports no recent falls  Home information: Pt lives with mother who is his primary caregiver. Previously had home aid, 3x/week but reports currently waiting for new one.  PATIENT GOALS: Pt would like to improve his ability to ambulate.  OBJECTIVE:   DIAGNOSTIC FINDINGS: No recent imaging per chart  Objective measures taken at eval unless otherwise specified:   Posture: forward head posture, rounded shoulders  MMT LE: grossly  4+/5, exception hip flexors and abductors 4/5 each bilat   Functional tests:  5xSTS:  34 sec, use of BUE (multiple initial attempts with posterior LOB in chair)   : 0.096 m/s with RW, close CGA, and WC follow  Gait for distance (goal 150 ft): deferred due to time  Gait mechanics: impaired, decreased speed (see ), crouched posture, poor postural stability and relies heavily on BUE support on RW. Pt now with bilat AFOS   FOTO: 44 (goal 50)    TODAY'S TREATMENT:   Physical therapy treatment session today consisted of completing assessment of goals and administration of testing as demonstrated and documented in flow sheet, treatment, and goals section of this note. Addition treatments may be found below.  Pt session was cut a little short today due to pt arriving a few minutes late to scheduled appointment time. Pt also had to use bathroom at beginning of session which further limited time for goal assessment.  Unless otherwise  stated, CGA was provided and gait belt donned in order to ensure pt safety.  PATIENT EDUCATION: Education details: Pt educated throughout session about proper posture and technique with exercises. Improved exercise technique, movement at target joints, use of target muscles after min to mod verbal, visual, tactile cues.   Person educated: Patient Education method: Explanation, Demonstration, Tactile cues, and Verbal cues Education comprehension: verbalized understanding, returned demonstration, and verbal cues required  HOME EXERCISE PROGRAM:  Reviewed HEP  in session but unable to issue printout today due to printer error - will provide printout next visit  Access Code: [email or web address] URL: [email or web address] Date: 10/01/2022 Prepared by: Temple Pacini  Exercises - Seated March  - 1 x daily - 6 x weekly - 3 sets - 10 reps - Seated Hip Abduction  - 1 x daily - 6 x weekly - 2 sets - 10 reps  GOALS: Goals reviewed with patient? No  SHORT TERM GOALS: Target date: 11/12/2022  Pt will be independent with HEP in order to improve strength and balance in order to decrease fall risk and improve function at home. Baseline: instructed in HEP today, to provide printout next visit Goal status: MET    LONG TERM GOALS: Target date: 12/24/2022  Pt will increase FOTO to at least 50 in order to demonstrate improvement in function related to mobility and QoL. Baseline: 44 7/29:48 Goal status: NEW  2.  Pt (< 60 yrs old) will complete 5xSTS test < 30 seconds indicating an increase in LE strength and improved balance. Baseline: 10/01/22 34 sec (multiple initial attempts with posterior LOB in chair) 12/20: 49 seconds 11/04/22:1:06, min A on multiple attempts to maintain balance and pt stays in flexed posture throughout test when coming to standing.  Goal status: ONGOING  3.  Pt will increase to > 0.5 m/s (30 sec) as to improve gait speed for better home ambulation and reduce fall  risk. Baseline: 10/01/22 0.096 m/s with RW, close CGA, and WC follow previously 12/27: 0.11 m/s 11/04/22: .056 m/s Goal status: ONGOING   4.  Pt will increase hip flexor and abductor  gross strength to 4+/5 bilat as to improve functional strength for independent gait, increased standing tolerance, and increased ADL ability. Baseline: 10/01/22 4+/5, exception hip flexors and abductors 4/5 each bilat  (previously 03/27/22: 4+/5 ) Goal status: NEW  5.  Pt will ambulate 150 ft with BRW and WC follow with no rest breaks to allow for improved mobility within home and increased independence. Baseline: 10/03/22 150' with BRW 04/03/22: 105 ft with BRW 7/29: 100 in 9:30 with pediatric RW Goal status: ONGOING    ASSESSMENT :  CLINICAL IMPRESSION:  Pt presents to PT for progress note this date.  Pt session was cut a little short today due to pt arriving a few minutes late to scheduled appointment time Patient demonstrates similar functional outcomes to his initial evaluation.  This may be due to in part to patient utilizing pediatric rolling walker.  Patient reports he uses bariatric rolling walker at home although this walker may be too high for him based on his constantly flexed posture the new pediatric marker may have thrown him off and impaired his time and comfort with completing functional testing this date.  With sit to stand activities patient demonstrates significant weakness and imbalance requiring assistance to come to full standing and unable to transition to standing with full erect posture.  Continue to monitor patient's progress in the future and assess whether pediatric or bariatric walker will be more appropriate for him based on his stature. Patient's condition has the potential to improve in response to therapy. Maximum improvement is yet to be obtained. The anticipated improvement is attainable and reasonable in a generally predictable time.  Patient will continue to benefit from skilled  therapy to address remaining deficits in order to improve overall QoL and return to PLOF.      OBJECTIVE IMPAIRMENTS Abnormal gait, decreased activity tolerance, decreased balance, decreased coordination,  decreased endurance, decreased mobility, difficulty walking, decreased ROM, decreased strength, improper body mechanics, postural dysfunction, and obesity.   ACTIVITY LIMITATIONS carrying, lifting, bending, sitting, standing, squatting, stairs, transfers, bed mobility, continence, bathing, toileting, dressing, hygiene/grooming, and locomotion level  PARTICIPATION LIMITATIONS: meal prep, cleaning, laundry, interpersonal relationship, driving, shopping, community activity, and yard work  PERSONAL FACTORS Fitness, Past/current experiences, Time since onset of injury/illness/exacerbation, and 3+ comorbidities: hx of brain tumor, past CVA, HTN, obesity, learning disability, DMII  are also affecting patient's functional outcome.   REHAB POTENTIAL: Fair    CLINICAL DECISION MAKING: Stable/uncomplicated  EVALUATION COMPLEXITY: Low  PLAN: PT FREQUENCY: 2x/week  PT DURATION: 12 weeks  PLANNED INTERVENTIONS: Therapeutic exercises, Therapeutic activity, Neuromuscular re-education, Balance training, Gait training, Patient/Family education, Joint mobilization, Stair training, Vestibular training, Orthotic/Fit training, DME instructions, Dry Needling, Electrical stimulation, Wheelchair mobility training, Cryotherapy, Moist heat, Compression bandaging, Manual therapy, and Re-evaluation  PLAN FOR NEXT SESSION:   continue with strengthening exercises of the LE's,  functional ambulation when appropriate. Continue plan    Norman Herrlich PT Physical Therapist- [facility]  [facility]   1:25 PM 11/04/22

## 2022-11-06 NOTE — Therapy (Signed)
OUTPATIENT PHYSICAL THERAPY NEURO TREATMENT    Patient Name: Curtis Powell MRN: [id number] DOB:05/11/1965, 57 y.o., male Today's Date: 11/07/2022  PCP: Enid Baas, MD  REFERRING PROVIDER: Enid Baas, MD    PT End of Session - 11/07/22 1057     Visit Number 11    Number of Visits 25    Date for PT Re-Evaluation 12/24/22    PT Start Time 1100    PT Stop Time 1144    PT Time Calculation (min) 44 min    Equipment Utilized During Treatment Gait belt    Activity Tolerance Patient tolerated treatment well    Behavior During Therapy WFL for tasks assessed/performed                    Past Medical History:  Diagnosis Date   Asthma    GERD (gastroesophageal reflux disease)    Hyperlipidemia    Hypertension    Obesity    Stroke [facility])    Past Surgical History:  Procedure Laterality Date   BRAIN SURGERY     Patient Active Problem List   Diagnosis Date Noted   Impaired glucose tolerance 03/23/2020   Learning disability 03/23/2020   Urinary and fecal incontinence 06/14/2019   Chronic pain syndrome 03/23/2019   Constipation 03/23/2019   Dysarthria 03/23/2019   Dysphagia 03/23/2019   Learning difficulty 03/23/2019   Morbid obesity (HCC) 03/23/2019   Muscle weakness 03/23/2019   Vitamin D deficiency 03/23/2019   Bilateral carotid artery stenosis 12/15/2018   Moderate aortic valve stenosis 12/15/2018   BMI 45.0-49.9, adult (HCC) 11/10/2018   Cerebrovascular accident (HCC) 05/02/2016   Hemiparesis affecting right side as late effect of cerebrovascular accident (HCC) 05/01/2016   Brain tumor (HCC) 11/24/2014   Gastro-esophageal reflux disease without esophagitis 11/24/2014   Accident due to mechanical fall without injury 08/03/2014   Essential hypertension 06/13/2014   Mixed hyperlipidemia 06/13/2014   Unspecified sequelae of cerebral infarction 06/13/2014   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/2014   Type 2 diabetes mellitus without complication (HCC) 01/06/2014    Neck mass 09/08/2013   Swimmer's ear 09/08/2013   Erectile dysfunction 01/03/2013   Prediabetes 01/03/2013   ONSET DATE: 05/09/2016  REFERRING DIAG: W09.811 (ICD-10-CM) - Hemiplegia and hemiparesis following cerebral infarction affecting right dominant side   THERAPY DIAG:  Muscle weakness (generalized)  Difficulty in walking, not elsewhere classified  Unsteadiness on feet  Rationale for Evaluation and Treatment Rehabilitation  SUBJECTIVE:  SUBJECTIVE STATEMENT Patient reports no falls or LOB since last session.   Pt accompanied by: family member/caregiver  PERTINENT HISTORY:  The pt is a pleasant 57 yo male who returns to PT for evaluation of deficits s/o old CVA. Pt with hx of CVA Feb 2018. He was previously fully independent, but CVA caused significant deficits, mainly affecting his right side. Pt reports continued difficulty with walking and transfers. Pt still using WC for majority of mobility, otherwise he uses his RW. Pt lives with his mom who is his main caregiver. Patient had outpatient PT previously in clinic in 2023. He reports since discharge he has been completing his HEP and walking about every day. Pt would like to improve his walking mainly, but also would like to work on balance. He also reports difficulty with transfers. Pt's mom and his caregiver helps him with ADLs. However, he says he is now waiting for a new caregiver. Prior to this pt had caregiver 3x/week. PMH significant for hx of brain tumor, past CVA, DMII and HTN. See chart for additional information.  PAIN:  Are you having pain? Pt reports no pain currently  PRECAUTIONS: fall  WEIGHT BEARING RESTRICTIONS none  PLOF: required assistance with ADLs (old CVA), prior to CVA in 2018 was indep.  History of Falls: Pt reports  no recent falls  Home information: Pt lives with mother who is his primary caregiver. Previously had home aid, 3x/week but reports currently waiting for new one.  PATIENT GOALS: Pt would like to improve his ability to ambulate.  OBJECTIVE:   DIAGNOSTIC FINDINGS: No recent imaging per chart  Objective measures taken at eval unless otherwise specified:   Posture: forward head posture, rounded shoulders  MMT LE: grossly  4+/5, exception hip flexors and abductors 4/5 each bilat   Functional tests:  5xSTS:  34 sec, use of BUE (multiple initial attempts with posterior LOB in chair)   : 0.096 m/s with RW, close CGA, and WC follow  Gait for distance (goal 150 ft): deferred due to time  Gait mechanics: impaired, decreased speed (see ), crouched posture, poor postural stability and relies heavily on BUE support on RW. Pt now with bilat AFOS   FOTO: 44 (goal 50)    TODAY'S TREATMENT  TherAct Sit to stand 6x; unable to obtain full sit to stand technique first five, requires mod A x 2 person assist to obtain full standing Standing weight shift in standing with CGA x 2 person assist.  Static stand 60 seconds with BRW and x2 person CGA  ; x 3 sets ; min A required to mod A in standing; max cueing for glute squeeze    Ther Ex Standing: March into GTB across walker: SPT holding onto walker ; PT performing min A for CGA x12 each LE; very chalelnging  Seated: Cross body punches to SPT hands seated in chair for cross body coordination and sequencing 10x; 2 sets  LAQ with 2 lbs; 10x each LE ; hold 3 seconds  Marches 2 lbs 10x each LE;  Glute squeezes 10x 3 second holds  GTB abduction 20x   Ambulation with BWW with wc follow CGA cues for posture and step length: terminated due to inability to maintain standing position.   PATIENT EDUCATION: Education details: Pt educated throughout session about proper posture and technique with exercises. Improved exercise technique, movement  at target joints, use of target muscles after min to mod verbal, visual, tactile cues.   Person educated: Patient Education method: Explanation,  Demonstration, Tactile cues, and Verbal cues Education comprehension: verbalized understanding, returned demonstration, and verbal cues required  HOME EXERCISE PROGRAM:  Reviewed HEP in session but unable to issue printout today due to printer error - will provide printout next visit  Access Code: [email or web address] URL: [email or web address] Date: 10/01/2022 Prepared by: Temple Pacini  Exercises - Seated March  - 1 x daily - 6 x weekly - 3 sets - 10 reps - Seated Hip Abduction  - 1 x daily - 6 x weekly - 2 sets - 10 reps  GOALS: Goals reviewed with patient? No  SHORT TERM GOALS: Target date: 11/12/2022  Pt will be independent with HEP in order to improve strength and balance in order to decrease fall risk and improve function at home. Baseline: instructed in HEP today, to provide printout next visit Goal status: MET    LONG TERM GOALS: Target date: 12/24/2022  Pt will increase FOTO to at least 50 in order to demonstrate improvement in function related to mobility and QoL. Baseline: 44 7/29:48 Goal status: NEW  2.  Pt (< 60 yrs old) will complete 5xSTS test < 30 seconds indicating an increase in LE strength and improved balance. Baseline: 10/01/22 34 sec (multiple initial attempts with posterior LOB in chair) 12/20: 49 seconds 11/04/22:1:06, min A on multiple attempts to maintain balance and pt stays in flexed posture throughout test when coming to standing.  Goal status: ONGOING  3.  Pt will increase to > 0.5 m/s (30 sec) as to improve gait speed for better home ambulation and reduce fall risk. Baseline: 10/01/22 0.096 m/s with RW, close CGA, and WC follow previously 12/27: 0.11 m/s 11/04/22: .056 m/s Goal status: ONGOING   4.  Pt will increase hip flexor and abductor  gross strength to 4+/5 bilat as to improve functional  strength for independent gait, increased standing tolerance, and increased ADL ability. Baseline: 10/01/22 4+/5, exception hip flexors and abductors 4/5 each bilat  (previously 03/27/22: 4+/5 ) Goal status: NEW  5.  Pt will ambulate 150 ft with BRW and WC follow with no rest breaks to allow for improved mobility within home and increased independence. Baseline: 10/03/22 150' with BRW 04/03/22: 105 ft with BRW 7/29: 100 in 9:30 with pediatric RW Goal status: ONGOING    ASSESSMENT :  CLINICAL IMPRESSION:   Patient unable to tolerate walking this session. He is very unstable in standing this session and requires x 2 person assist. Max cueing for glute activation in standing required throughout session due to patient fatigue and limited ability to maintain a position in standing that is stable. Patient is highly motivated despite instability.  Patient will continue to benefit from skilled therapy to address remaining deficits in order to improve overall QoL and return to PLOF.      OBJECTIVE IMPAIRMENTS Abnormal gait, decreased activity tolerance, decreased balance, decreased coordination, decreased endurance, decreased mobility, difficulty walking, decreased ROM, decreased strength, improper body mechanics, postural dysfunction, and obesity.   ACTIVITY LIMITATIONS carrying, lifting, bending, sitting, standing, squatting, stairs, transfers, bed mobility, continence, bathing, toileting, dressing, hygiene/grooming, and locomotion level  PARTICIPATION LIMITATIONS: meal prep, cleaning, laundry, interpersonal relationship, driving, shopping, community activity, and yard work  PERSONAL FACTORS Fitness, Past/current experiences, Time since onset of injury/illness/exacerbation, and 3+ comorbidities: hx of brain tumor, past CVA, HTN, obesity, learning disability, DMII  are also affecting patient's functional outcome.   REHAB POTENTIAL: Fair    CLINICAL DECISION MAKING: Stable/uncomplicated  EVALUATION  COMPLEXITY: Low  PLAN: PT FREQUENCY:  2x/week  PT DURATION: 12 weeks  PLANNED INTERVENTIONS: Therapeutic exercises, Therapeutic activity, Neuromuscular re-education, Balance training, Gait training, Patient/Family education, Joint mobilization, Stair training, Vestibular training, Orthotic/Fit training, DME instructions, Dry Needling, Electrical stimulation, Wheelchair mobility training, Cryotherapy, Moist heat, Compression bandaging, Manual therapy, and Re-evaluation  PLAN FOR NEXT SESSION:   continue with strengthening exercises of the LE's,  functional ambulation when appropriate. Continue plan    Precious Bard PT Physical Therapist- [facility]   11:44 AM 11/07/22

## 2022-11-07 ENCOUNTER — Ambulatory Visit: Payer: 59 | Attending: Internal Medicine

## 2022-11-07 DIAGNOSIS — R531 Weakness: Secondary | ICD-10-CM | POA: Diagnosis present

## 2022-11-07 DIAGNOSIS — R262 Difficulty in walking, not elsewhere classified: Secondary | ICD-10-CM | POA: Insufficient documentation

## 2022-11-07 DIAGNOSIS — R2689 Other abnormalities of gait and mobility: Secondary | ICD-10-CM | POA: Insufficient documentation

## 2022-11-07 DIAGNOSIS — M6281 Muscle weakness (generalized): Secondary | ICD-10-CM | POA: Insufficient documentation

## 2022-11-07 DIAGNOSIS — R269 Unspecified abnormalities of gait and mobility: Secondary | ICD-10-CM | POA: Insufficient documentation

## 2022-11-07 DIAGNOSIS — R296 Repeated falls: Secondary | ICD-10-CM | POA: Diagnosis present

## 2022-11-07 DIAGNOSIS — R2681 Unsteadiness on feet: Secondary | ICD-10-CM | POA: Insufficient documentation

## 2022-11-11 ENCOUNTER — Ambulatory Visit: Payer: 59 | Admitting: Physical Therapy

## 2022-11-14 ENCOUNTER — Encounter: Payer: Self-pay | Admitting: Physical Therapy

## 2022-11-14 ENCOUNTER — Ambulatory Visit: Payer: 59 | Admitting: Physical Therapy

## 2022-11-14 DIAGNOSIS — M6281 Muscle weakness (generalized): Secondary | ICD-10-CM | POA: Diagnosis not present

## 2022-11-14 DIAGNOSIS — R531 Weakness: Secondary | ICD-10-CM

## 2022-11-14 DIAGNOSIS — R2681 Unsteadiness on feet: Secondary | ICD-10-CM

## 2022-11-14 DIAGNOSIS — R262 Difficulty in walking, not elsewhere classified: Secondary | ICD-10-CM

## 2022-11-14 DIAGNOSIS — R2689 Other abnormalities of gait and mobility: Secondary | ICD-10-CM

## 2022-11-14 NOTE — Therapy (Signed)
OUTPATIENT PHYSICAL THERAPY NEURO TREATMENT    Patient Name: Curtis Powell MRN: [id number] DOB:02-Jan-1966, 57 y.o., male Today's Date: 11/14/2022  PCP: Enid Baas, MD  REFERRING PROVIDER: Enid Baas, MD    PT End of Session - 11/14/22 1005     Visit Number 12    Number of Visits 25    Date for PT Re-Evaluation 12/24/22    PT Start Time 1015    PT Stop Time 1100    PT Time Calculation (min) 45 min    Equipment Utilized During Treatment Gait belt    Activity Tolerance Patient tolerated treatment well    Behavior During Therapy WFL for tasks assessed/performed                    Past Medical History:  Diagnosis Date   Asthma    GERD (gastroesophageal reflux disease)    Hyperlipidemia    Hypertension    Obesity    Stroke [facility])    Past Surgical History:  Procedure Laterality Date   BRAIN SURGERY     Patient Active Problem List   Diagnosis Date Noted   Impaired glucose tolerance 03/23/2020   Learning disability 03/23/2020   Urinary and fecal incontinence 06/14/2019   Chronic pain syndrome 03/23/2019   Constipation 03/23/2019   Dysarthria 03/23/2019   Dysphagia 03/23/2019   Learning difficulty 03/23/2019   Morbid obesity (HCC) 03/23/2019   Muscle weakness 03/23/2019   Vitamin D deficiency 03/23/2019   Bilateral carotid artery stenosis 12/15/2018   Moderate aortic valve stenosis 12/15/2018   BMI 45.0-49.9, adult (HCC) 11/10/2018   Cerebrovascular accident (HCC) 05/02/2016   Hemiparesis affecting right side as late effect of cerebrovascular accident (HCC) 05/01/2016   Brain tumor (HCC) 11/24/2014   Gastro-esophageal reflux disease without esophagitis 11/24/2014   Accident due to mechanical fall without injury 08/03/2014   Essential hypertension 06/13/2014   Mixed hyperlipidemia 06/13/2014   Unspecified sequelae of cerebral infarction 06/13/2014   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/2014   Type 2 diabetes mellitus without complication (HCC) 01/06/2014    Neck mass 09/08/2013   Swimmer's ear 09/08/2013   Erectile dysfunction 01/03/2013   Prediabetes 01/03/2013   ONSET DATE: 05/09/2016  REFERRING DIAG: Q65.784 (ICD-10-CM) - Hemiplegia and hemiparesis following cerebral infarction affecting right dominant side   THERAPY DIAG:  Muscle weakness (generalized)  Difficulty in walking, not elsewhere classified  Unsteadiness on feet  Other abnormalities of gait and mobility  Left-sided weakness  Rationale for Evaluation and Treatment Rehabilitation  SUBJECTIVE:  SUBJECTIVE STATEMENT  Pt reports he felt motivated to come in today despite the rain. Reports no changes since last session. Reports feeling a little better since he was last here.   Pt accompanied by: family member/caregiver  PERTINENT HISTORY:  The pt is a pleasant 57 yo male who returns to PT for evaluation of deficits s/o old CVA. Pt with hx of CVA Feb 2018. He was previously fully independent, but CVA caused significant deficits, mainly affecting his right side. Pt reports continued difficulty with walking and transfers. Pt still using WC for majority of mobility, otherwise he uses his RW. Pt lives with his mom who is his main caregiver. Patient had outpatient PT previously in clinic in 2023. He reports since discharge he has been completing his HEP and walking about every day. Pt would like to improve his walking mainly, but also would like to work on balance. He also reports difficulty with transfers. Pt's mom and his caregiver helps him with ADLs. However, he says he is now waiting for a new caregiver. Prior to this pt had caregiver 3x/week. PMH significant for hx of brain tumor, past CVA, DMII and HTN. See chart for additional information.  PAIN:  Are you having pain? Pt reports no pain  currently  PRECAUTIONS: fall  WEIGHT BEARING RESTRICTIONS none  PLOF: required assistance with ADLs (old CVA), prior to CVA in 2018 was indep.  History of Falls: Pt reports no recent falls  Home information: Pt lives with mother who is his primary caregiver. Previously had home aid, 3x/week but reports currently waiting for new one.  PATIENT GOALS: Pt would like to improve his ability to ambulate.  OBJECTIVE:   DIAGNOSTIC FINDINGS: No recent imaging per chart  Objective measures taken at eval unless otherwise specified:   Posture: forward head posture, rounded shoulders  MMT LE: grossly  4+/5, exception hip flexors and abductors 4/5 each bilat   Functional tests:  5xSTS:  34 sec, use of BUE (multiple initial attempts with posterior LOB in chair)   : 0.096 m/s with RW, close CGA, and WC follow  Gait for distance (goal 150 ft): deferred due to time  Gait mechanics: impaired, decreased speed (see ), crouched posture, poor postural stability and relies heavily on BUE support on RW. Pt now with bilat AFOS   FOTO: 44 (goal 50)    TODAY'S TREATMENT   TE: Seated: LAQ with 2 lbs; 2*10 each LE ; hold 3 seconds  Marches 2 lbs 2* 10 each LE;  Glute press down with GTB 2 x 10 ea LE  Ball squeeze 2 x 10 reps x 5 sec holds  GTB abduction 20x   TA:  The following activities were completed in parallel bars or at balance bar  - Sit to stand with focus on postural extension in standing  X 10 marches in standing  X 10 mini squats with focus on full erect posture each rep   STS  Forward and backward ambulation in // bars x 2 laps -cues to pick up feet with retro gait.   STS then full erect posture then 2 x knee flexion for HS activation and foot clearance x 5 reps   STS  10 x a/p leans working on balance  X 10 sec attempted stance wihtout UE support, constant post lean ( pt providing min A to prevent post LOB)    PATIENT EDUCATION: Education details: Pt educated  throughout session about proper posture and technique with exercises. Improved exercise technique,  movement at target joints, use of target muscles after min to mod verbal, visual, tactile cues.   Person educated: Patient Education method: Explanation, Demonstration, Tactile cues, and Verbal cues Education comprehension: verbalized understanding, returned demonstration, and verbal cues required  HOME EXERCISE PROGRAM:  Reviewed HEP in session but unable to issue printout today due to printer error - will provide printout next visit  Access Code: [email or web address] URL: [email or web address] Date: 10/01/2022 Prepared by: Temple Pacini  Exercises - Seated March  - 1 x daily - 6 x weekly - 3 sets - 10 reps - Seated Hip Abduction  - 1 x daily - 6 x weekly - 2 sets - 10 reps  GOALS: Goals reviewed with patient? No  SHORT TERM GOALS: Target date: 11/12/2022  Pt will be independent with HEP in order to improve strength and balance in order to decrease fall risk and improve function at home. Baseline: instructed in HEP today, to provide printout next visit Goal status: MET    LONG TERM GOALS: Target date: 12/24/2022  Pt will increase FOTO to at least 50 in order to demonstrate improvement in function related to mobility and QoL. Baseline: 44 7/29:48 Goal status: NEW  2.  Pt (< 60 yrs old) will complete 5xSTS test < 30 seconds indicating an increase in LE strength and improved balance. Baseline: 10/01/22 34 sec (multiple initial attempts with posterior LOB in chair) 12/20: 49 seconds 11/04/22:1:06, min A on multiple attempts to maintain balance and pt stays in flexed posture throughout test when coming to standing.  Goal status: ONGOING  3.  Pt will increase to > 0.5 m/s (30 sec) as to improve gait speed for better home ambulation and reduce fall risk. Baseline: 10/01/22 0.096 m/s with RW, close CGA, and WC follow previously 12/27: 0.11 m/s 11/04/22: .056 m/s Goal status: ONGOING    4.  Pt will increase hip flexor and abductor  gross strength to 4+/5 bilat as to improve functional strength for independent gait, increased standing tolerance, and increased ADL ability. Baseline: 10/01/22 4+/5, exception hip flexors and abductors 4/5 each bilat  (previously 03/27/22: 4+/5 ) Goal status: NEW  5.  Pt will ambulate 150 ft with BRW and WC follow with no rest breaks to allow for improved mobility within home and increased independence. Baseline: 10/03/22 150' with BRW 04/03/22: 105 ft with BRW 7/29: 100 in 9:30 with pediatric RW Goal status: ONGOING    ASSESSMENT :  CLINICAL IMPRESSION:  Patient presents with good motivation for completion of physical therapy activities.  Patient started with seated therapeutic exercise in order to activate target lower extremity muscle groups.  Patient then proceeded to perform several standing and ambulation test within the parallel bars with overall good response.  Patient shows improved ability to stand and full erect posture with support of parallel bars as well as improved ability to perform multiple sit to stands with less signs and symptoms of fatigue compared to previous visits when seen by this therapist.Pt will continue to benefit from skilled physical therapy intervention to address impairments, improve QOL, and attain therapy goals.     OBJECTIVE IMPAIRMENTS Abnormal gait, decreased activity tolerance, decreased balance, decreased coordination, decreased endurance, decreased mobility, difficulty walking, decreased ROM, decreased strength, improper body mechanics, postural dysfunction, and obesity.   ACTIVITY LIMITATIONS carrying, lifting, bending, sitting, standing, squatting, stairs, transfers, bed mobility, continence, bathing, toileting, dressing, hygiene/grooming, and locomotion level  PARTICIPATION LIMITATIONS: meal prep, cleaning, laundry, interpersonal relationship, driving, shopping, community activity, and yard  work  PERSONAL Land, Past/current experiences, Time since onset of injury/illness/exacerbation, and 3+ comorbidities: hx of brain tumor, past CVA, HTN, obesity, learning disability, DMII  are also affecting patient's functional outcome.   REHAB POTENTIAL: Fair    CLINICAL DECISION MAKING: Stable/uncomplicated  EVALUATION COMPLEXITY: Low  PLAN: PT FREQUENCY: 2x/week  PT DURATION: 12 weeks  PLANNED INTERVENTIONS: Therapeutic exercises, Therapeutic activity, Neuromuscular re-education, Balance training, Gait training, Patient/Family education, Joint mobilization, Stair training, Vestibular training, Orthotic/Fit training, DME instructions, Dry Needling, Electrical stimulation, Wheelchair mobility training, Cryotherapy, Moist heat, Compression bandaging, Manual therapy, and Re-evaluation  PLAN FOR NEXT SESSION:   continue with strengthening exercises of the LE's,  functional ambulation when appropriate. Continue plan    Norman Herrlich PT Physical Therapist- [facility]  [facility]   10:06 AM 11/14/22

## 2022-11-18 ENCOUNTER — Encounter: Payer: Self-pay | Admitting: Physical Therapy

## 2022-11-18 ENCOUNTER — Ambulatory Visit: Payer: 59 | Admitting: Physical Therapy

## 2022-11-18 DIAGNOSIS — M6281 Muscle weakness (generalized): Secondary | ICD-10-CM

## 2022-11-18 DIAGNOSIS — R2689 Other abnormalities of gait and mobility: Secondary | ICD-10-CM

## 2022-11-18 DIAGNOSIS — R262 Difficulty in walking, not elsewhere classified: Secondary | ICD-10-CM

## 2022-11-18 DIAGNOSIS — R2681 Unsteadiness on feet: Secondary | ICD-10-CM

## 2022-11-18 NOTE — Therapy (Signed)
OUTPATIENT PHYSICAL THERAPY NEURO TREATMENT    Patient Name: Curtis Powell MRN: [id number] DOB:01-24-1966, 57 y.o., male Today's Date: 11/18/2022  PCP: Enid Baas, MD  REFERRING PROVIDER: Enid Baas, MD    PT End of Session - 11/18/22 1105     Visit Number 13    Number of Visits 25    Date for PT Re-Evaluation 12/24/22    Progress Note Due on Visit 10    PT Start Time 1015    PT Stop Time 1059    PT Time Calculation (min) 44 min    Equipment Utilized During Treatment Gait belt    Activity Tolerance Patient tolerated treatment well    Behavior During Therapy WFL for tasks assessed/performed                     Past Medical History:  Diagnosis Date   Asthma    GERD (gastroesophageal reflux disease)    Hyperlipidemia    Hypertension    Obesity    Stroke [facility])    Past Surgical History:  Procedure Laterality Date   BRAIN SURGERY     Patient Active Problem List   Diagnosis Date Noted   Impaired glucose tolerance 03/23/2020   Learning disability 03/23/2020   Urinary and fecal incontinence 06/14/2019   Chronic pain syndrome 03/23/2019   Constipation 03/23/2019   Dysarthria 03/23/2019   Dysphagia 03/23/2019   Learning difficulty 03/23/2019   Morbid obesity (HCC) 03/23/2019   Muscle weakness 03/23/2019   Vitamin D deficiency 03/23/2019   Bilateral carotid artery stenosis 12/15/2018   Moderate aortic valve stenosis 12/15/2018   BMI 45.0-49.9, adult (HCC) 11/10/2018   Cerebrovascular accident (HCC) 05/02/2016   Hemiparesis affecting right side as late effect of cerebrovascular accident (HCC) 05/01/2016   Brain tumor (HCC) 11/24/2014   Gastro-esophageal reflux disease without esophagitis 11/24/2014   Accident due to mechanical fall without injury 08/03/2014   Essential hypertension 06/13/2014   Mixed hyperlipidemia 06/13/2014   Unspecified sequelae of cerebral infarction 06/13/2014   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/2014   Type 2 diabetes mellitus  without complication (HCC) 01/06/2014   Neck mass 09/08/2013   Swimmer's ear 09/08/2013   Erectile dysfunction 01/03/2013   Prediabetes 01/03/2013   ONSET DATE: 05/09/2016  REFERRING DIAG: M84.132 (ICD-10-CM) - Hemiplegia and hemiparesis following cerebral infarction affecting right dominant side   THERAPY DIAG:  Muscle weakness (generalized)  Difficulty in walking, not elsewhere classified  Unsteadiness on feet  Other abnormalities of gait and mobility  Rationale for Evaluation and Treatment Rehabilitation  SUBJECTIVE:  SUBJECTIVE STATEMENT  Pt reports doing well today. Pt denies any recent falls/stumbles since prior session. Pt denies any updates to medications or medical appointment since prior session. Pt reports good compliance with HEP when time permits.    Pt accompanied by: family member/caregiver  PERTINENT HISTORY:  The pt is a pleasant 57 yo male who returns to PT for evaluation of deficits s/o old CVA. Pt with hx of CVA Feb 2018. He was previously fully independent, but CVA caused significant deficits, mainly affecting his right side. Pt reports continued difficulty with walking and transfers. Pt still using WC for majority of mobility, otherwise he uses his RW. Pt lives with his mom who is his main caregiver. Patient had outpatient PT previously in clinic in 2023. He reports since discharge he has been completing his HEP and walking about every day. Pt would like to improve his walking mainly, but also would like to work on balance. He also reports difficulty with transfers. Pt's mom and his caregiver helps him with ADLs. However, he says he is now waiting for a new caregiver. Prior to this pt had caregiver 3x/week. PMH significant for hx of brain tumor, past CVA, DMII and HTN. See chart  for additional information.  PAIN:  Are you having pain? Pt reports no pain currently  PRECAUTIONS: fall  WEIGHT BEARING RESTRICTIONS none  PLOF: required assistance with ADLs (old CVA), prior to CVA in 2018 was indep.  History of Falls: Pt reports no recent falls  Home information: Pt lives with mother who is his primary caregiver. Previously had home aid, 3x/week but reports currently waiting for new one.  PATIENT GOALS: Pt would like to improve his ability to ambulate.  OBJECTIVE:   DIAGNOSTIC FINDINGS: No recent imaging per chart  Objective measures taken at eval unless otherwise specified:   Posture: forward head posture, rounded shoulders  MMT LE: grossly  4+/5, exception hip flexors and abductors 4/5 each bilat   Functional tests:  5xSTS:  34 sec, use of BUE (multiple initial attempts with posterior LOB in chair)   : 0.096 m/s with RW, close CGA, and WC follow  Gait for distance (goal 150 ft): deferred due to time  Gait mechanics: impaired, decreased speed (see ), crouched posture, poor postural stability and relies heavily on BUE support on RW. Pt now with bilat AFOS   FOTO: 44 (goal 50)    TODAY'S TREATMENT   TE: Seated: Glute press down with GTB 2 x 10 ea LE  Ball squeeze 2 x 10 reps x 5 sec holds  GTB abduction 2*10*3 sec holds   TA:   The following activities were completed in parallel bars or at balance bar   STS  Forward and backward ambulation in // bars x 2 laps -cues to pick up feet with retro gait.  REST STS then x 4 marches stand to sit then repeat x 5 reps  REST STS X 4 rounds of standing withotu UE support and cga to min A throughout, difficulty with ant weight shift resulting in post LOB, pt reports his hips/ hip flexor area are tight in this position which likely limits him from standing gin erect posture.   REST  STS  Hip flexor stretch in standing, manual cues for increased stretch intensity ( knee blocking and  instruction)  REST STS X 10 ea LE knee flexion ea LE  REST STS Ambualtion forward in // bars then retro in // bars x 1 lap with focus on erect  posture and proper ant weight shift. Heavy UE support.    PATIENT EDUCATION: Education details: Pt educated throughout session about proper posture and technique with exercises. Improved exercise technique, movement at target joints, use of target muscles after min to mod verbal, visual, tactile cues.   Person educated: Patient Education method: Explanation, Demonstration, Tactile cues, and Verbal cues Education comprehension: verbalized understanding, returned demonstration, and verbal cues required  HOME EXERCISE PROGRAM:  Reviewed HEP in session but unable to issue printout today due to printer error - will provide printout next visit  Access Code: [email or web address] URL: [email or web address] Date: 10/01/2022 Prepared by: Temple Pacini  Exercises - Seated March  - 1 x daily - 6 x weekly - 3 sets - 10 reps - Seated Hip Abduction  - 1 x daily - 6 x weekly - 2 sets - 10 reps  GOALS: Goals reviewed with patient? No  SHORT TERM GOALS: Target date: 11/12/2022  Pt will be independent with HEP in order to improve strength and balance in order to decrease fall risk and improve function at home. Baseline: instructed in HEP today, to provide printout next visit Goal status: MET    LONG TERM GOALS: Target date: 12/24/2022  Pt will increase FOTO to at least 50 in order to demonstrate improvement in function related to mobility and QoL. Baseline: 44 7/29:48 Goal status: NEW  2.  Pt (< 60 yrs old) will complete 5xSTS test < 30 seconds indicating an increase in LE strength and improved balance. Baseline: 10/01/22 34 sec (multiple initial attempts with posterior LOB in chair) 12/20: 49 seconds 11/04/22:1:06, min A on multiple attempts to maintain balance and pt stays in flexed posture throughout test when coming to standing.  Goal status:  ONGOING  3.  Pt will increase to > 0.5 m/s (30 sec) as to improve gait speed for better home ambulation and reduce fall risk. Baseline: 10/01/22 0.096 m/s with RW, close CGA, and WC follow previously 12/27: 0.11 m/s 11/04/22: .056 m/s Goal status: ONGOING   4.  Pt will increase hip flexor and abductor  gross strength to 4+/5 bilat as to improve functional strength for independent gait, increased standing tolerance, and increased ADL ability. Baseline: 10/01/22 4+/5, exception hip flexors and abductors 4/5 each bilat  (previously 03/27/22: 4+/5 ) Goal status: NEW  5.  Pt will ambulate 150 ft with BRW and WC follow with no rest breaks to allow for improved mobility within home and increased independence. Baseline: 10/03/22 150' with BRW 04/03/22: 105 ft with BRW 7/29: 100 in 9:30 with pediatric RW Goal status: ONGOING    ASSESSMENT :  CLINICAL IMPRESSION:  Patient presents with good motivation for completion of physical therapy activities.  Patient started with seated therapeutic exercise in order to activate target lower extremity muscle groups.  Patient then proceeded to perform several standing and ambulation test within the parallel bars with overall good response.  Patient continues to show improved mechanics with ambulation and with transfers in // bars. Pt will continue to benefit from skilled physical therapy intervention to address impairments, improve QOL, and attain therapy goals.     OBJECTIVE IMPAIRMENTS Abnormal gait, decreased activity tolerance, decreased balance, decreased coordination, decreased endurance, decreased mobility, difficulty walking, decreased ROM, decreased strength, improper body mechanics, postural dysfunction, and obesity.   ACTIVITY LIMITATIONS carrying, lifting, bending, sitting, standing, squatting, stairs, transfers, bed mobility, continence, bathing, toileting, dressing, hygiene/grooming, and locomotion level  PARTICIPATION LIMITATIONS: meal prep,  cleaning, laundry, interpersonal relationship, driving, shopping, community  activity, and yard work  PERSONAL FACTORS Fitness, Past/current experiences, Time since onset of injury/illness/exacerbation, and 3+ comorbidities: hx of brain tumor, past CVA, HTN, obesity, learning disability, DMII  are also affecting patient's functional outcome.   REHAB POTENTIAL: Fair    CLINICAL DECISION MAKING: Stable/uncomplicated  EVALUATION COMPLEXITY: Low  PLAN: PT FREQUENCY: 2x/week  PT DURATION: 12 weeks  PLANNED INTERVENTIONS: Therapeutic exercises, Therapeutic activity, Neuromuscular re-education, Balance training, Gait training, Patient/Family education, Joint mobilization, Stair training, Vestibular training, Orthotic/Fit training, DME instructions, Dry Needling, Electrical stimulation, Wheelchair mobility training, Cryotherapy, Moist heat, Compression bandaging, Manual therapy, and Re-evaluation  PLAN FOR NEXT SESSION:   continue with strengthening exercises of the LE's,  functional ambulation when appropriate. Continue plan    Norman Herrlich PT Physical Therapist- [facility]  [facility]   12:48 PM 11/18/22

## 2022-11-19 ENCOUNTER — Ambulatory Visit: Payer: 59

## 2022-11-20 ENCOUNTER — Ambulatory Visit: Payer: 59 | Admitting: Physical Therapy

## 2022-11-20 DIAGNOSIS — R531 Weakness: Secondary | ICD-10-CM

## 2022-11-20 DIAGNOSIS — R296 Repeated falls: Secondary | ICD-10-CM

## 2022-11-20 DIAGNOSIS — R262 Difficulty in walking, not elsewhere classified: Secondary | ICD-10-CM

## 2022-11-20 DIAGNOSIS — M6281 Muscle weakness (generalized): Secondary | ICD-10-CM

## 2022-11-20 DIAGNOSIS — R2689 Other abnormalities of gait and mobility: Secondary | ICD-10-CM

## 2022-11-20 DIAGNOSIS — R269 Unspecified abnormalities of gait and mobility: Secondary | ICD-10-CM

## 2022-11-20 DIAGNOSIS — R2681 Unsteadiness on feet: Secondary | ICD-10-CM

## 2022-11-20 NOTE — Therapy (Addendum)
OUTPATIENT PHYSICAL THERAPY NEURO TREATMENT    Patient Name: Curtis Powell MRN: [id number] DOB:1965/06/11, 57 y.o., male Today's Date: 11/25/2022  PCP: Enid Baas, MD  REFERRING PROVIDER: Enid Baas, MD     PT End of Session - 11/14/22 1005     Visit Number 13   Number of Visits 25    Date for PT Re-Evaluation 12/24/22    PT Start Time 1146   PT Stop Time 1228   PT Time Calculation (min) 45 min    Equipment Utilized During Treatment Gait belt    Activity Tolerance Patient tolerated treatment well    Behavior During Therapy WFL for tasks assessed/performed                  Past Medical History:  Diagnosis Date   Asthma    GERD (gastroesophageal reflux disease)    Hyperlipidemia    Hypertension    Obesity    Stroke [facility])    Past Surgical History:  Procedure Laterality Date   BRAIN SURGERY     Patient Active Problem List   Diagnosis Date Noted   Impaired glucose tolerance 03/23/2020   Learning disability 03/23/2020   Urinary and fecal incontinence 06/14/2019   Chronic pain syndrome 03/23/2019   Constipation 03/23/2019   Dysarthria 03/23/2019   Dysphagia 03/23/2019   Learning difficulty 03/23/2019   Morbid obesity (HCC) 03/23/2019   Muscle weakness 03/23/2019   Vitamin D deficiency 03/23/2019   Bilateral carotid artery stenosis 12/15/2018   Moderate aortic valve stenosis 12/15/2018   BMI 45.0-49.9, adult (HCC) 11/10/2018   Cerebrovascular accident (HCC) 05/02/2016   Hemiparesis affecting right side as late effect of cerebrovascular accident (HCC) 05/01/2016   Brain tumor (HCC) 11/24/2014   Gastro-esophageal reflux disease without esophagitis 11/24/2014   Accident due to mechanical fall without injury 08/03/2014   Essential hypertension 06/13/2014   Mixed hyperlipidemia 06/13/2014   Unspecified sequelae of cerebral infarction 06/13/2014   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/2014   Type 2 diabetes mellitus without complication (HCC) 01/06/2014    Neck mass 09/08/2013   Swimmer's ear 09/08/2013   Erectile dysfunction 01/03/2013   Prediabetes 01/03/2013   ONSET DATE: 05/09/2016  REFERRING DIAG: W09.811 (ICD-10-CM) - Hemiplegia and hemiparesis following cerebral infarction affecting right dominant side   THERAPY DIAG:  Muscle weakness (generalized)  Difficulty in walking, not elsewhere classified  Unsteadiness on feet  Other abnormalities of gait and mobility  Left-sided weakness  Repeated falls  Abnormality of gait and mobility  Rationale for Evaluation and Treatment Rehabilitation  SUBJECTIVE:  SUBJECTIVE STATEMENT  Pt reports doing well today. Pt denies any recent falls/stumbles since prior session. Pt denies any updates to medications or medical appointment since prior session. Pt reports good compliance with HEP when time permits.    Pt accompanied by: family member/caregiver  PERTINENT HISTORY:  The pt is a pleasant 57 yo male who returns to PT for evaluation of deficits s/o old CVA. Pt with hx of CVA Feb 2018. He was previously fully independent, but CVA caused significant deficits, mainly affecting his right side. Pt reports continued difficulty with walking and transfers. Pt still using WC for majority of mobility, otherwise he uses his RW. Pt lives with his mom who is his main caregiver. Patient had outpatient PT previously in clinic in 2023. He reports since discharge he has been completing his HEP and walking about every day. Pt would like to improve his walking mainly, but also would like to work on balance. He also reports difficulty with transfers. Pt's mom and his caregiver helps him with ADLs. However, he says he is now waiting for a new caregiver. Prior to this pt had caregiver 3x/week. PMH significant for hx of brain tumor,  past CVA, DMII and HTN. See chart for additional information.  PAIN:  Are you having pain? Pt reports no pain currently  PRECAUTIONS: fall  WEIGHT BEARING RESTRICTIONS none  PLOF: required assistance with ADLs (old CVA), prior to CVA in 2018 was indep.  History of Falls: Pt reports no recent falls  Home information: Pt lives with mother who is his primary caregiver. Previously had home aid, 3x/week but reports currently waiting for new one.  PATIENT GOALS: Pt would like to improve his ability to ambulate.  OBJECTIVE:   DIAGNOSTIC FINDINGS: No recent imaging per chart  Objective measures taken at eval unless otherwise specified:   Posture: forward head posture, rounded shoulders  MMT LE: grossly  4+/5, exception hip flexors and abductors 4/5 each bilat   Functional tests:  5xSTS:  34 sec, use of BUE (multiple initial attempts with posterior LOB in chair)   : 0.096 m/s with RW, close CGA, and WC follow  Gait for distance (goal 150 ft): deferred due to time  Gait mechanics: impaired, decreased speed (see ), crouched posture, poor postural stability and relies heavily on BUE support on RW. Pt now with bilat AFOS   FOTO: 44 (goal 50)    TODAY'S TREATMENT   TE: Seated: Glute press down with GTB 2 x 15 ea LE  Ball squeeze 2 x 10 reps x 5 sec holds  GTB abduction 2*10*3 sec holds   TA:   The following activities were completed in parallel bars or at balance bar   REST STS then x 4 marches stand to sit then repeat x 5 reps  REST STS Forward and backward ambulation in // bars x 2 laps -cues to pick up feet with retro gait.  REST STS X 10 ea LE knee flexion with hip extension  REST Forward and backward ambulation in // bars x 2 laps -cues to use marching strategy for large steps with forward ambulation and cues for using strategy of hip extension with knee flexion from earlier standing activity to improve gait. REST STS X 4 rounds of standing withotu  UE support and cga to min A throughout, difficulty with ant weight shift resulting in post LOB, pt reports his hips/ hip flexor area are tight in this position which likely limits him from standing in erect posture.  X 10 squats with UE support    PATIENT EDUCATION: Education details: Pt educated throughout session about proper posture and technique with exercises. Improved exercise technique, movement at target joints, use of target muscles after min to mod verbal, visual, tactile cues.   Person educated: Patient Education method: Explanation, Demonstration, Tactile cues, and Verbal cues Education comprehension: verbalized understanding, returned demonstration, and verbal cues required  HOME EXERCISE PROGRAM:  Reviewed HEP in session but unable to issue printout today due to printer error - will provide printout next visit  Access Code: [email or web address] URL: [email or web address] Date: 10/01/2022 Prepared by: Temple Pacini  Exercises - Seated March  - 1 x daily - 6 x weekly - 3 sets - 10 reps - Seated Hip Abduction  - 1 x daily - 6 x weekly - 2 sets - 10 reps  GOALS: Goals reviewed with patient? No  SHORT TERM GOALS: Target date: 11/12/2022  Pt will be independent with HEP in order to improve strength and balance in order to decrease fall risk and improve function at home. Baseline: instructed in HEP today, to provide printout next visit Goal status: MET    LONG TERM GOALS: Target date: 12/24/2022  Pt will increase FOTO to at least 50 in order to demonstrate improvement in function related to mobility and QoL. Baseline: 44 7/29:48 Goal status: NEW  2.  Pt (< 60 yrs old) will complete 5xSTS test < 30 seconds indicating an increase in LE strength and improved balance. Baseline: 10/01/22 34 sec (multiple initial attempts with posterior LOB in chair) 12/20: 49 seconds 11/04/22:1:06, min A on multiple attempts to maintain balance and pt stays in flexed posture throughout test  when coming to standing.  Goal status: ONGOING  3.  Pt will increase to > 0.5 m/s (30 sec) as to improve gait speed for better home ambulation and reduce fall risk. Baseline: 10/01/22 0.096 m/s with RW, close CGA, and WC follow previously 12/27: 0.11 m/s 11/04/22: .056 m/s Goal status: ONGOING   4.  Pt will increase hip flexor and abductor  gross strength to 4+/5 bilat as to improve functional strength for independent gait, increased standing tolerance, and increased ADL ability. Baseline: 10/01/22 4+/5, exception hip flexors and abductors 4/5 each bilat  (previously 03/27/22: 4+/5 ) Goal status: NEW  5.  Pt will ambulate 150 ft with BRW and WC follow with no rest breaks to allow for improved mobility within home and increased independence. Baseline: 10/03/22 150' with BRW 04/03/22: 105 ft with BRW 7/29: 100 in 9:30 with pediatric RW Goal status: ONGOING    ASSESSMENT :  CLINICAL IMPRESSION:  Patient presents with good motivation for completion of physical therapy activities.  Patient started with seated therapeutic exercise in order to activate target lower extremity muscle groups.  Patient then proceeded to perform several standing and ambulation test within the parallel bars with overall good response.  Patient continues to show improved mechanics with ambulation and with transfers in // bars. Pt will continue to benefit from skilled physical therapy intervention to address impairments, improve QOL, and attain therapy goals.     OBJECTIVE IMPAIRMENTS Abnormal gait, decreased activity tolerance, decreased balance, decreased coordination, decreased endurance, decreased mobility, difficulty walking, decreased ROM, decreased strength, improper body mechanics, postural dysfunction, and obesity.   ACTIVITY LIMITATIONS carrying, lifting, bending, sitting, standing, squatting, stairs, transfers, bed mobility, continence, bathing, toileting, dressing, hygiene/grooming, and locomotion  level  PARTICIPATION LIMITATIONS: meal prep, cleaning, laundry, interpersonal relationship, driving, shopping, community activity, and yard  work  PERSONAL [place], Past/current experiences, Time since onset of injury/illness/exacerbation, and 3+ comorbidities: hx of brain tumor, past CVA, HTN, obesity, learning disability, DMII  are also affecting patient's functional outcome.   REHAB POTENTIAL: Fair    CLINICAL DECISION MAKING: Stable/uncomplicated  EVALUATION COMPLEXITY: Low  PLAN: PT FREQUENCY: 2x/week  PT DURATION: 12 weeks  PLANNED INTERVENTIONS: Therapeutic exercises, Therapeutic activity, Neuromuscular re-education, Balance training, Gait training, Patient/Family education, Joint mobilization, Stair training, Vestibular training, Orthotic/Fit training, DME instructions, Dry Needling, Electrical stimulation, Wheelchair mobility training, Cryotherapy, Moist heat, Compression bandaging, Manual therapy, and Re-evaluation  PLAN FOR NEXT SESSION:   continue with strengthening exercises of the LE's,  functional ambulation when appropriate. Continue plan    Norman Herrlich PT Physical Therapist- [facility]  [facility]   9:18 AM 11/25/22

## 2022-11-21 ENCOUNTER — Ambulatory Visit: Payer: 59

## 2022-11-25 ENCOUNTER — Ambulatory Visit: Payer: 59 | Admitting: Physical Therapy

## 2022-11-25 DIAGNOSIS — R2681 Unsteadiness on feet: Secondary | ICD-10-CM

## 2022-11-25 DIAGNOSIS — M6281 Muscle weakness (generalized): Secondary | ICD-10-CM

## 2022-11-25 DIAGNOSIS — R2689 Other abnormalities of gait and mobility: Secondary | ICD-10-CM

## 2022-11-25 DIAGNOSIS — R262 Difficulty in walking, not elsewhere classified: Secondary | ICD-10-CM

## 2022-11-25 NOTE — Therapy (Signed)
OUTPATIENT PHYSICAL THERAPY NEURO TREATMENT    Patient Name: Curtis Powell MRN: [id number] DOB:30-Jun-1965, 57 y.o., male Today's Date: 11/25/2022  PCP: Enid Baas, MD  REFERRING PROVIDER: Enid Baas, MD    PT End of Session - 11/25/22 1020     Visit Number 15    Number of Visits 25    Date for PT Re-Evaluation 12/24/22    Progress Note Due on Visit 20    PT Start Time 1016    PT Stop Time 1100    PT Time Calculation (min) 44 min    Equipment Utilized During Treatment Gait belt    Activity Tolerance Patient tolerated treatment well    Behavior During Therapy WFL for tasks assessed/performed                      Past Medical History:  Diagnosis Date   Asthma    GERD (gastroesophageal reflux disease)    Hyperlipidemia    Hypertension    Obesity    Stroke [facility])    Past Surgical History:  Procedure Laterality Date   BRAIN SURGERY     Patient Active Problem List   Diagnosis Date Noted   Impaired glucose tolerance 03/23/2020   Learning disability 03/23/2020   Urinary and fecal incontinence 06/14/2019   Chronic pain syndrome 03/23/2019   Constipation 03/23/2019   Dysarthria 03/23/2019   Dysphagia 03/23/2019   Learning difficulty 03/23/2019   Morbid obesity (HCC) 03/23/2019   Muscle weakness 03/23/2019   Vitamin D deficiency 03/23/2019   Bilateral carotid artery stenosis 12/15/2018   Moderate aortic valve stenosis 12/15/2018   BMI 45.0-49.9, adult (HCC) 11/10/2018   Cerebrovascular accident (HCC) 05/02/2016   Hemiparesis affecting right side as late effect of cerebrovascular accident (HCC) 05/01/2016   Brain tumor (HCC) 11/24/2014   Gastro-esophageal reflux disease without esophagitis 11/24/2014   Accident due to mechanical fall without injury 08/03/2014   Essential hypertension 06/13/2014   Mixed hyperlipidemia 06/13/2014   Unspecified sequelae of cerebral infarction 06/13/2014   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/2014   Type 2 diabetes mellitus  without complication (HCC) 01/06/2014   Neck mass 09/08/2013   Swimmer's ear 09/08/2013   Erectile dysfunction 01/03/2013   Prediabetes 01/03/2013   ONSET DATE: 05/09/2016  REFERRING DIAG: Q65.784 (ICD-10-CM) - Hemiplegia and hemiparesis following cerebral infarction affecting right dominant side   THERAPY DIAG:  Muscle weakness (generalized)  Difficulty in walking, not elsewhere classified  Unsteadiness on feet  Other abnormalities of gait and mobility  Rationale for Evaluation and Treatment Rehabilitation  SUBJECTIVE:  SUBJECTIVE STATEMENT  Pt reports doing well today. Pt denies any recent falls/stumbles since prior session. Pt denies any updates to medications or medical appointment since prior session. Pt reports good compliance with HEP when time permits.    Pt accompanied by: family member/caregiver  PERTINENT HISTORY:  The pt is a pleasant 57 yo male who returns to PT for evaluation of deficits s/o old CVA. Pt with hx of CVA Feb 2018. He was previously fully independent, but CVA caused significant deficits, mainly affecting his right side. Pt reports continued difficulty with walking and transfers. Pt still using WC for majority of mobility, otherwise he uses his RW. Pt lives with his mom who is his main caregiver. Patient had outpatient PT previously in clinic in 2023. He reports since discharge he has been completing his HEP and walking about every day. Pt would like to improve his walking mainly, but also would like to work on balance. He also reports difficulty with transfers. Pt's mom and his caregiver helps him with ADLs. However, he says he is now waiting for a new caregiver. Prior to this pt had caregiver 3x/week. PMH significant for hx of brain tumor, past CVA, DMII and HTN. See chart  for additional information.  PAIN:  Are you having pain? Pt reports no pain currently  PRECAUTIONS: fall  WEIGHT BEARING RESTRICTIONS none  PLOF: required assistance with ADLs (old CVA), prior to CVA in 2018 was indep.  History of Falls: Pt reports no recent falls  Home information: Pt lives with mother who is his primary caregiver. Previously had home aid, 3x/week but reports currently waiting for new one.  PATIENT GOALS: Pt would like to improve his ability to ambulate.  OBJECTIVE:   DIAGNOSTIC FINDINGS: No recent imaging per chart  Objective measures taken at eval unless otherwise specified:   Posture: forward head posture, rounded shoulders  MMT LE: grossly  4+/5, exception hip flexors and abductors 4/5 each bilat   Functional tests:  5xSTS:  34 sec, use of BUE (multiple initial attempts with posterior LOB in chair)   : 0.096 m/s with RW, close CGA, and WC follow  Gait for distance (goal 150 ft): deferred due to time  Gait mechanics: impaired, decreased speed (see ), crouched posture, poor postural stability and relies heavily on BUE support on RW. Pt now with bilat AFOS   FOTO: 44 (goal 50)    TODAY'S TREATMENT   TE: Seated: Glute press down with GTB 2 x 15 ea LE  Ball squeeze 2 x 10 reps x 5 sec holds  GTB abduction 2*10*3 sec holds   TA:   The following activities were completed in parallel bars or at balance bar   STS Forward and backward ambulation in // bars x 3 laps, standing in erect posture rest between sets -cues to pick up feet with retro gait.  REST STS STS then x 6 marches stand to sit then repeat x 5 reps  REST STS Forward ambulation then 180 degree turn in // bars x 4 lengths  REST STS X 4 rounds of standing without UE support and cga to min A throughout, cues for forward weight shift, 4 rounds of 15 sec holds  Assistance at end of session donning jacket and putting leg rests back on wheelchair   PATIENT  EDUCATION: Education details: Pt educated throughout session about proper posture and technique with exercises. Improved exercise technique, movement at target joints, use of target muscles after min to mod verbal, visual, tactile  cues.   Person educated: Patient Education method: Explanation, Demonstration, Tactile cues, and Verbal cues Education comprehension: verbalized understanding, returned demonstration, and verbal cues required  HOME EXERCISE PROGRAM:  Reviewed HEP in session but unable to issue printout today due to printer error - will provide printout next visit  Access Code: [email or web address] URL: [email or web address] Date: 10/01/2022 Prepared by: Temple Pacini  Exercises - Seated March  - 1 x daily - 6 x weekly - 3 sets - 10 reps - Seated Hip Abduction  - 1 x daily - 6 x weekly - 2 sets - 10 reps  GOALS: Goals reviewed with patient? No  SHORT TERM GOALS: Target date: 11/12/2022  Pt will be independent with HEP in order to improve strength and balance in order to decrease fall risk and improve function at home. Baseline: instructed in HEP today, to provide printout next visit Goal status: MET    LONG TERM GOALS: Target date: 12/24/2022  Pt will increase FOTO to at least 50 in order to demonstrate improvement in function related to mobility and QoL. Baseline: 44 7/29:48 Goal status: NEW  2.  Pt (< 60 yrs old) will complete 5xSTS test < 30 seconds indicating an increase in LE strength and improved balance. Baseline: 10/01/22 34 sec (multiple initial attempts with posterior LOB in chair) 12/20: 49 seconds 11/04/22:1:06, min A on multiple attempts to maintain balance and pt stays in flexed posture throughout test when coming to standing.  Goal status: ONGOING  3.  Pt will increase to > 0.5 m/s (30 sec) as to improve gait speed for better home ambulation and reduce fall risk. Baseline: 10/01/22 0.096 m/s with RW, close CGA, and WC follow previously 12/27: 0.11 m/s  11/04/22: .056 m/s Goal status: ONGOING   4.  Pt will increase hip flexor and abductor  gross strength to 4+/5 bilat as to improve functional strength for independent gait, increased standing tolerance, and increased ADL ability. Baseline: 10/01/22 4+/5, exception hip flexors and abductors 4/5 each bilat  (previously 03/27/22: 4+/5 ) Goal status: NEW  5.  Pt will ambulate 150 ft with BRW and WC follow with no rest breaks to allow for improved mobility within home and increased independence. Baseline: 10/03/22 150' with BRW 04/03/22: 105 ft with BRW 7/29: 100 in 9:30 with pediatric RW Goal status: ONGOING    ASSESSMENT :  CLINICAL IMPRESSION:  Patient presents with good motivation for completion of physical therapy activities.  Patient started with seated therapeutic exercise in order to activate target lower extremity muscle groups.  Patient then proceeded to perform several standing and ambulation test within the parallel bars with overall good response. Progressed repetitions and intensity with all standing activities this date.  Patient continues to show improved efficacy with ambulation and with transfers in // bars. Pt will continue to benefit from skilled physical therapy intervention to address impairments, improve QOL, and attain therapy goals.     OBJECTIVE IMPAIRMENTS Abnormal gait, decreased activity tolerance, decreased balance, decreased coordination, decreased endurance, decreased mobility, difficulty walking, decreased ROM, decreased strength, improper body mechanics, postural dysfunction, and obesity.   ACTIVITY LIMITATIONS carrying, lifting, bending, sitting, standing, squatting, stairs, transfers, bed mobility, continence, bathing, toileting, dressing, hygiene/grooming, and locomotion level  PARTICIPATION LIMITATIONS: meal prep, cleaning, laundry, interpersonal relationship, driving, shopping, community activity, and yard work  PERSONAL FACTORS Fitness, Past/current  experiences, Time since onset of injury/illness/exacerbation, and 3+ comorbidities: hx of brain tumor, past CVA, HTN, obesity, learning disability, DMII  are also affecting patient's functional  outcome.   REHAB POTENTIAL: Fair    CLINICAL DECISION MAKING: Stable/uncomplicated  EVALUATION COMPLEXITY: Low  PLAN: PT FREQUENCY: 2x/week  PT DURATION: 12 weeks  PLANNED INTERVENTIONS: Therapeutic exercises, Therapeutic activity, Neuromuscular re-education, Balance training, Gait training, Patient/Family education, Joint mobilization, Stair training, Vestibular training, Orthotic/Fit training, DME instructions, Dry Needling, Electrical stimulation, Wheelchair mobility training, Cryotherapy, Moist heat, Compression bandaging, Manual therapy, and Re-evaluation  PLAN FOR NEXT SESSION:   continue with strengthening exercises of the LE's,  functional ambulation when appropriate. Continue plan    Norman Herrlich PT Physical Therapist- [facility]   11:08 AM 11/25/22

## 2022-11-26 ENCOUNTER — Ambulatory Visit: Payer: 59

## 2022-11-27 ENCOUNTER — Ambulatory Visit: Payer: 59 | Admitting: Physical Therapy

## 2022-11-27 DIAGNOSIS — R262 Difficulty in walking, not elsewhere classified: Secondary | ICD-10-CM

## 2022-11-27 DIAGNOSIS — M6281 Muscle weakness (generalized): Secondary | ICD-10-CM

## 2022-11-27 DIAGNOSIS — R2681 Unsteadiness on feet: Secondary | ICD-10-CM

## 2022-11-27 DIAGNOSIS — R2689 Other abnormalities of gait and mobility: Secondary | ICD-10-CM

## 2022-11-27 NOTE — Therapy (Signed)
OUTPATIENT PHYSICAL THERAPY NEURO TREATMENT    Patient Name: Curtis Powell MRN: [id number] DOB:1965/05/26, 57 y.o., male Today's Date: 11/27/2022  PCP: Enid Baas, MD  REFERRING PROVIDER: Enid Baas, MD    PT End of Session - 11/27/22 1152     Visit Number 16    Number of Visits 25    Date for PT Re-Evaluation 12/24/22    Progress Note Due on Visit 20    PT Start Time 1149    PT Stop Time 1228    PT Time Calculation (min) 39 min    Equipment Utilized During Treatment Gait belt    Activity Tolerance Patient tolerated treatment well    Behavior During Therapy WFL for tasks assessed/performed                       Past Medical History:  Diagnosis Date   Asthma    GERD (gastroesophageal reflux disease)    Hyperlipidemia    Hypertension    Obesity    Stroke [facility])    Past Surgical History:  Procedure Laterality Date   BRAIN SURGERY     Patient Active Problem List   Diagnosis Date Noted   Impaired glucose tolerance 03/23/2020   Learning disability 03/23/2020   Urinary and fecal incontinence 06/14/2019   Chronic pain syndrome 03/23/2019   Constipation 03/23/2019   Dysarthria 03/23/2019   Dysphagia 03/23/2019   Learning difficulty 03/23/2019   Morbid obesity (HCC) 03/23/2019   Muscle weakness 03/23/2019   Vitamin D deficiency 03/23/2019   Bilateral carotid artery stenosis 12/15/2018   Moderate aortic valve stenosis 12/15/2018   BMI 45.0-49.9, adult (HCC) 11/10/2018   Cerebrovascular accident (HCC) 05/02/2016   Hemiparesis affecting right side as late effect of cerebrovascular accident (HCC) 05/01/2016   Brain tumor (HCC) 11/24/2014   Gastro-esophageal reflux disease without esophagitis 11/24/2014   Accident due to mechanical fall without injury 08/03/2014   Essential hypertension 06/13/2014   Mixed hyperlipidemia 06/13/2014   Unspecified sequelae of cerebral infarction 06/13/2014   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/2014   Type 2 diabetes mellitus  without complication (HCC) 01/06/2014   Neck mass 09/08/2013   Swimmer's ear 09/08/2013   Erectile dysfunction 01/03/2013   Prediabetes 01/03/2013   ONSET DATE: 05/09/2016  REFERRING DIAG: N82.956 (ICD-10-CM) - Hemiplegia and hemiparesis following cerebral infarction affecting right dominant side   THERAPY DIAG:  No diagnosis found.  Rationale for Evaluation and Treatment Rehabilitation  SUBJECTIVE:  SUBJECTIVE STATEMENT  Pt reports doing well today. Pt denies any recent falls/stumbles since prior session. Pt denies any updates to medications or medical appointment since prior session. Pt reports good compliance with HEP when time permits.    Pt accompanied by: family member/caregiver  PERTINENT HISTORY:  The pt is a pleasant 57 yo male who returns to PT for evaluation of deficits s/o old CVA. Pt with hx of CVA Feb 2018. He was previously fully independent, but CVA caused significant deficits, mainly affecting his right side. Pt reports continued difficulty with walking and transfers. Pt still using WC for majority of mobility, otherwise he uses his RW. Pt lives with his mom who is his main caregiver. Patient had outpatient PT previously in clinic in 2023. He reports since discharge he has been completing his HEP and walking about every day. Pt would like to improve his walking mainly, but also would like to work on balance. He also reports difficulty with transfers. Pt's mom and his caregiver helps him with ADLs. However, he says he is now waiting for a new caregiver. Prior to this pt had caregiver 3x/week. PMH significant for hx of brain tumor, past CVA, DMII and HTN. See chart for additional information.  PAIN:  Are you having pain? Pt reports no pain currently  PRECAUTIONS: fall  WEIGHT BEARING  RESTRICTIONS none  PLOF: required assistance with ADLs (old CVA), prior to CVA in 2018 was indep.  History of Falls: Pt reports no recent falls  Home information: Pt lives with mother who is his primary caregiver. Previously had home aid, 3x/week but reports currently waiting for new one.  PATIENT GOALS: Pt would like to improve his ability to ambulate.  OBJECTIVE:   DIAGNOSTIC FINDINGS: No recent imaging per chart  Objective measures taken at eval unless otherwise specified:   Posture: forward head posture, rounded shoulders  MMT LE: grossly  4+/5, exception hip flexors and abductors 4/5 each bilat   Functional tests:  5xSTS:  34 sec, use of BUE (multiple initial attempts with posterior LOB in chair)   : 0.096 m/s with RW, close CGA, and WC follow  Gait for distance (goal 150 ft): deferred due to time  Gait mechanics: impaired, decreased speed (see ), crouched posture, poor postural stability and relies heavily on BUE support on RW. Pt now with bilat AFOS   FOTO: 44 (goal 50)    TODAY'S TREATMENT   TE: Seated: Glute press down with GTB 2 x 15 ea LE  Bolster adductor  squeeze 2 x 10 reps x 5 sec holds  Seated march then knee extension to foot contact with bolster ( 6 in off ground) then press down for quad and gluteal activation 2 x 10 ea LE   TA:   The following activities were completed in parallel bars or at balance bar   STS x 5 with R UE support initially then reaching for // bar with L UE  -cues for when to reach for // bar  REST Forward and backward ambulation in // bars x 2 laps, standing in erect posture rest between sets -cues to pick up feet with retro gait.  REST STS X 60 sec prolonged stance without UE support, good hip strategy for balance maintenance after 60 sec prolonged stance pt able to shift weight laterally x 25 sec prior to needing UE assist.  REST  Ambulation in // bars 180 degree turn then return  Assistance at end of session  donning jacket and putting  leg rests back on wheelchair   PATIENT EDUCATION: Education details: Pt educated throughout session about proper posture and technique with exercises. Improved exercise technique, movement at target joints, use of target muscles after min to mod verbal, visual, tactile cues.   Person educated: Patient Education method: Explanation, Demonstration, Tactile cues, and Verbal cues Education comprehension: verbalized understanding, returned demonstration, and verbal cues required  HOME EXERCISE PROGRAM:  Reviewed HEP in session but unable to issue printout today due to printer error - will provide printout next visit  Access Code: [email or web address] URL: [email or web address] Date: 10/01/2022 Prepared by: Temple Pacini  Exercises - Seated March  - 1 x daily - 6 x weekly - 3 sets - 10 reps - Seated Hip Abduction  - 1 x daily - 6 x weekly - 2 sets - 10 reps  GOALS: Goals reviewed with patient? No  SHORT TERM GOALS: Target date: 11/12/2022  Pt will be independent with HEP in order to improve strength and balance in order to decrease fall risk and improve function at home. Baseline: instructed in HEP today, to provide printout next visit Goal status: MET    LONG TERM GOALS: Target date: 12/24/2022  Pt will increase FOTO to at least 50 in order to demonstrate improvement in function related to mobility and QoL. Baseline: 44 7/29:48 Goal status: NEW  2.  Pt (< 60 yrs old) will complete 5xSTS test < 30 seconds indicating an increase in LE strength and improved balance. Baseline: 10/01/22 34 sec (multiple initial attempts with posterior LOB in chair) 12/20: 49 seconds 11/04/22:1:06, min A on multiple attempts to maintain balance and pt stays in flexed posture throughout test when coming to standing.  Goal status: ONGOING  3.  Pt will increase to > 0.5 m/s (30 sec) as to improve gait speed for better home ambulation and reduce fall risk. Baseline: 10/01/22  0.096 m/s with RW, close CGA, and WC follow previously 12/27: 0.11 m/s 11/04/22: .056 m/s Goal status: ONGOING   4.  Pt will increase hip flexor and abductor  gross strength to 4+/5 bilat as to improve functional strength for independent gait, increased standing tolerance, and increased ADL ability. Baseline: 10/01/22 4+/5, exception hip flexors and abductors 4/5 each bilat  (previously 03/27/22: 4+/5 ) Goal status: NEW  5.  Pt will ambulate 150 ft with BRW and WC follow with no rest breaks to allow for improved mobility within home and increased independence. Baseline: 10/03/22 150' with BRW 04/03/22: 105 ft with BRW 7/29: 100 in 9:30 with pediatric RW Goal status: ONGOING    ASSESSMENT :  CLINICAL IMPRESSION:  Patient presents with good motivation for completion of physical therapy activities.  Patient started with seated therapeutic exercise in order to activate target lower extremity muscle groups.  Patient then proceeded to perform several standing and ambulation test within the parallel bars with overall good response. Progressed repetitions and intensity with all standing activities this date.  Patient continues to show improved efficacy with ambulation and with transfers in // bars. Pt will continue to benefit from skilled physical therapy intervention to address impairments, improve QOL, and attain therapy goals.     OBJECTIVE IMPAIRMENTS Abnormal gait, decreased activity tolerance, decreased balance, decreased coordination, decreased endurance, decreased mobility, difficulty walking, decreased ROM, decreased strength, improper body mechanics, postural dysfunction, and obesity.   ACTIVITY LIMITATIONS carrying, lifting, bending, sitting, standing, squatting, stairs, transfers, bed mobility, continence, bathing, toileting, dressing, hygiene/grooming, and locomotion level  PARTICIPATION LIMITATIONS: meal prep, cleaning, laundry, interpersonal  relationship, driving, shopping, community  activity, and yard work  PERSONAL FACTORS Fitness, Past/current experiences, Time since onset of injury/illness/exacerbation, and 3+ comorbidities: hx of brain tumor, past CVA, HTN, obesity, learning disability, DMII  are also affecting patient's functional outcome.   REHAB POTENTIAL: Fair    CLINICAL DECISION MAKING: Stable/uncomplicated  EVALUATION COMPLEXITY: Low  PLAN: PT FREQUENCY: 2x/week  PT DURATION: 12 weeks  PLANNED INTERVENTIONS: Therapeutic exercises, Therapeutic activity, Neuromuscular re-education, Balance training, Gait training, Patient/Family education, Joint mobilization, Stair training, Vestibular training, Orthotic/Fit training, DME instructions, Dry Needling, Electrical stimulation, Wheelchair mobility training, Cryotherapy, Moist heat, Compression bandaging, Manual therapy, and Re-evaluation  PLAN FOR NEXT SESSION:   continue with strengthening exercises of the LE's,  functional ambulation when appropriate. Continue plan    Norman Herrlich PT Physical Therapist- [facility]  [facility]   11:53 AM 11/27/22

## 2022-11-28 ENCOUNTER — Ambulatory Visit: Payer: 59

## 2022-12-02 ENCOUNTER — Ambulatory Visit: Payer: 59 | Admitting: Physical Therapy

## 2022-12-02 DIAGNOSIS — M6281 Muscle weakness (generalized): Secondary | ICD-10-CM

## 2022-12-02 DIAGNOSIS — R531 Weakness: Secondary | ICD-10-CM

## 2022-12-02 DIAGNOSIS — R262 Difficulty in walking, not elsewhere classified: Secondary | ICD-10-CM

## 2022-12-02 DIAGNOSIS — R2681 Unsteadiness on feet: Secondary | ICD-10-CM

## 2022-12-02 DIAGNOSIS — R2689 Other abnormalities of gait and mobility: Secondary | ICD-10-CM

## 2022-12-02 NOTE — Therapy (Signed)
OUTPATIENT PHYSICAL THERAPY NEURO TREATMENT    Patient Name: Curtis Powell MRN: [id number] DOB:02/13/1966, 57 y.o., male Today's Date: 12/02/2022  PCP: Enid Baas, MD  REFERRING PROVIDER: Enid Baas, MD    PT End of Session - 12/02/22 1050     Visit Number 17    Number of Visits 25    Date for PT Re-Evaluation 12/24/22    Progress Note Due on Visit 20    PT Start Time 1040    PT Stop Time 1100    PT Time Calculation (min) 20 min    Equipment Utilized During Treatment Gait belt    Activity Tolerance Patient tolerated treatment well    Behavior During Therapy WFL for tasks assessed/performed                        Past Medical History:  Diagnosis Date   Asthma    GERD (gastroesophageal reflux disease)    Hyperlipidemia    Hypertension    Obesity    Stroke [facility])    Past Surgical History:  Procedure Laterality Date   BRAIN SURGERY     Patient Active Problem List   Diagnosis Date Noted   Impaired glucose tolerance 03/23/2020   Learning disability 03/23/2020   Urinary and fecal incontinence 06/14/2019   Chronic pain syndrome 03/23/2019   Constipation 03/23/2019   Dysarthria 03/23/2019   Dysphagia 03/23/2019   Learning difficulty 03/23/2019   Morbid obesity (HCC) 03/23/2019   Muscle weakness 03/23/2019   Vitamin D deficiency 03/23/2019   Bilateral carotid artery stenosis 12/15/2018   Moderate aortic valve stenosis 12/15/2018   BMI 45.0-49.9, adult (HCC) 11/10/2018   Cerebrovascular accident (HCC) 05/02/2016   Hemiparesis affecting right side as late effect of cerebrovascular accident (HCC) 05/01/2016   Brain tumor (HCC) 11/24/2014   Gastro-esophageal reflux disease without esophagitis 11/24/2014   Accident due to mechanical fall without injury 08/03/2014   Essential hypertension 06/13/2014   Mixed hyperlipidemia 06/13/2014   Unspecified sequelae of cerebral infarction 06/13/2014   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/2014   Type 2 diabetes  mellitus without complication (HCC) 01/06/2014   Neck mass 09/08/2013   Swimmer's ear 09/08/2013   Erectile dysfunction 01/03/2013   Prediabetes 01/03/2013   ONSET DATE: 05/09/2016  REFERRING DIAG: B28.413 (ICD-10-CM) - Hemiplegia and hemiparesis following cerebral infarction affecting right dominant side   THERAPY DIAG:  No diagnosis found.  Rationale for Evaluation and Treatment Rehabilitation  SUBJECTIVE:  SUBJECTIVE STATEMENT  Pt reports doing well today. Pt denies any recent falls/stumbles since prior session. Pt denies any updates to medications or medical appointment since prior session. Pt reports good compliance with HEP when time permits. Pt reports his transportation services arrived late which made him late to the appointment.    Pt accompanied by: family member/caregiver  PERTINENT HISTORY:  The pt is a pleasant 57 yo male who returns to PT for evaluation of deficits s/o old CVA. Pt with hx of CVA Feb 2018. He was previously fully independent, but CVA caused significant deficits, mainly affecting his right side. Pt reports continued difficulty with walking and transfers. Pt still using WC for majority of mobility, otherwise he uses his RW. Pt lives with his mom who is his main caregiver. Patient had outpatient PT previously in clinic in 2023. He reports since discharge he has been completing his HEP and walking about every day. Pt would like to improve his walking mainly, but also would like to work on balance. He also reports difficulty with transfers. Pt's mom and his caregiver helps him with ADLs. However, he says he is now waiting for a new caregiver. Prior to this pt had caregiver 3x/week. PMH significant for hx of brain tumor, past CVA, DMII and HTN. See chart for additional  information.  PAIN:  Are you having pain? Pt reports no pain currently  PRECAUTIONS: fall  WEIGHT BEARING RESTRICTIONS none  PLOF: required assistance with ADLs (old CVA), prior to CVA in 2018 was indep.  History of Falls: Pt reports no recent falls  Home information: Pt lives with mother who is his primary caregiver. Previously had home aid, 3x/week but reports currently waiting for new one.  PATIENT GOALS: Pt would like to improve his ability to ambulate.  OBJECTIVE:   DIAGNOSTIC FINDINGS: No recent imaging per chart  Objective measures taken at eval unless otherwise specified:   Posture: forward head posture, rounded shoulders  MMT LE: grossly  4+/5, exception hip flexors and abductors 4/5 each bilat   Functional tests:  5xSTS:  34 sec, use of BUE (multiple initial attempts with posterior LOB in chair)   : 0.096 m/s with RW, close CGA, and WC follow  Gait for distance (goal 150 ft): deferred due to time  Gait mechanics: impaired, decreased speed (see ), crouched posture, poor postural stability and relies heavily on BUE support on RW. Pt now with bilat AFOS   FOTO: 44 (goal 50)    TODAY'S TREATMENT Pt session was cut a little short today due to pt arriving late to scheduled appointment time  TA:   The following activities were completed in parallel bars or at balance bar  Forward and backward ambulation in // bars x 3 laps, standing in erect posture rest between sets -cues to pick up feet with retro gait.  REST STS x 5 with R UE support initially then reaching for // bar with L UE  -cues for when to reach for // bar , oncei n standing pt challenged with keeping balance without UE support x 30 sec, frequent cues for hips shifted forward as pt tends to use stopped psoturs shifting majority of weight post.  REST STS x 5 reps with UE support. Cues for full erect posture each round.    PATIENT EDUCATION: Education details: Pt educated throughout session  about proper posture and technique with exercises. Improved exercise technique, movement at target joints, use of target muscles after min to mod verbal, visual, tactile  cues.   Person educated: Patient Education method: Explanation, Demonstration, Tactile cues, and Verbal cues Education comprehension: verbalized understanding, returned demonstration, and verbal cues required  HOME EXERCISE PROGRAM:  Reviewed HEP in session but unable to issue printout today due to printer error - will provide printout next visit  Access Code: [email or web address] URL: [email or web address] Date: 10/01/2022 Prepared by: Temple Pacini  Exercises - Seated March  - 1 x daily - 6 x weekly - 3 sets - 10 reps - Seated Hip Abduction  - 1 x daily - 6 x weekly - 2 sets - 10 reps  GOALS: Goals reviewed with patient? No  SHORT TERM GOALS: Target date: 11/12/2022  Pt will be independent with HEP in order to improve strength and balance in order to decrease fall risk and improve function at home. Baseline: instructed in HEP today, to provide printout next visit Goal status: MET    LONG TERM GOALS: Target date: 12/24/2022  Pt will increase FOTO to at least 50 in order to demonstrate improvement in function related to mobility and QoL. Baseline: 44 7/29:48 Goal status: NEW  2.  Pt (< 60 yrs old) will complete 5xSTS test < 30 seconds indicating an increase in LE strength and improved balance. Baseline: 10/01/22 34 sec (multiple initial attempts with posterior LOB in chair) 12/20: 49 seconds 11/04/22:1:06, min A on multiple attempts to maintain balance and pt stays in flexed posture throughout test when coming to standing.  Goal status: ONGOING  3.  Pt will increase to > 0.5 m/s (30 sec) as to improve gait speed for better home ambulation and reduce fall risk. Baseline: 10/01/22 0.096 m/s with RW, close CGA, and WC follow previously 12/27: 0.11 m/s 11/04/22: .056 m/s Goal status: ONGOING   4.  Pt will  increase hip flexor and abductor  gross strength to 4+/5 bilat as to improve functional strength for independent gait, increased standing tolerance, and increased ADL ability. Baseline: 10/01/22 4+/5, exception hip flexors and abductors 4/5 each bilat  (previously 03/27/22: 4+/5 ) Goal status: NEW  5.  Pt will ambulate 150 ft with BRW and WC follow with no rest breaks to allow for improved mobility within home and increased independence. Baseline: 10/03/22 150' with BRW 04/03/22: 105 ft with BRW 7/29: 100 in 9:30 with pediatric RW Goal status: ONGOING    ASSESSMENT :  CLINICAL IMPRESSION:  Patient presents with good motivation for completion of physical therapy activities. Pt session was cut a little short today due to pt arriving late to scheduled appointment time Patient started with seated therapeutic exercise in order to activate target lower extremity muscle groups.  Patient then proceeded to perform several standing and ambulation test within the parallel bars with overall good response.  Patient continues to show improved efficacy with ambulation and with transfers in // bars.  Pt will continue to benefit from skilled physical therapy intervention to address impairments, improve QOL, and attain therapy goals.     OBJECTIVE IMPAIRMENTS Abnormal gait, decreased activity tolerance, decreased balance, decreased coordination, decreased endurance, decreased mobility, difficulty walking, decreased ROM, decreased strength, improper body mechanics, postural dysfunction, and obesity.   ACTIVITY LIMITATIONS carrying, lifting, bending, sitting, standing, squatting, stairs, transfers, bed mobility, continence, bathing, toileting, dressing, hygiene/grooming, and locomotion level  PARTICIPATION LIMITATIONS: meal prep, cleaning, laundry, interpersonal relationship, driving, shopping, community activity, and yard work  PERSONAL FACTORS Fitness, Past/current experiences, Time since onset of  injury/illness/exacerbation, and 3+ comorbidities: hx of brain tumor, past CVA, HTN, obesity, learning disability,  DMII  are also affecting patient's functional outcome.   REHAB POTENTIAL: Fair    CLINICAL DECISION MAKING: Stable/uncomplicated  EVALUATION COMPLEXITY: Low  PLAN: PT FREQUENCY: 2x/week  PT DURATION: 12 weeks  PLANNED INTERVENTIONS: Therapeutic exercises, Therapeutic activity, Neuromuscular re-education, Balance training, Gait training, Patient/Family education, Joint mobilization, Stair training, Vestibular training, Orthotic/Fit training, DME instructions, Dry Needling, Electrical stimulation, Wheelchair mobility training, Cryotherapy, Moist heat, Compression bandaging, Manual therapy, and Re-evaluation  PLAN FOR NEXT SESSION:   continue with strengthening exercises of the LE's,  functional ambulation when appropriate. Continue plan    Norman Herrlich PT Physical Therapist- [facility]  [facility]   10:50 AM 12/02/22

## 2022-12-03 ENCOUNTER — Ambulatory Visit: Payer: 59

## 2022-12-04 ENCOUNTER — Ambulatory Visit: Payer: 59 | Admitting: Physical Therapy

## 2022-12-04 DIAGNOSIS — R531 Weakness: Secondary | ICD-10-CM

## 2022-12-04 DIAGNOSIS — R296 Repeated falls: Secondary | ICD-10-CM

## 2022-12-04 DIAGNOSIS — M6281 Muscle weakness (generalized): Secondary | ICD-10-CM | POA: Diagnosis not present

## 2022-12-04 DIAGNOSIS — R262 Difficulty in walking, not elsewhere classified: Secondary | ICD-10-CM

## 2022-12-04 DIAGNOSIS — R2689 Other abnormalities of gait and mobility: Secondary | ICD-10-CM

## 2022-12-04 DIAGNOSIS — R2681 Unsteadiness on feet: Secondary | ICD-10-CM

## 2022-12-04 NOTE — Therapy (Signed)
OUTPATIENT PHYSICAL THERAPY NEURO TREATMENT    Patient Name: Curtis Powell MRN: [id number] DOB:1965/07/18, 57 y.o., male Today's Date: 12/04/2022  PCP: Enid Baas, MD  REFERRING PROVIDER: Enid Baas, MD    PT End of Session - 12/04/22 1014     Visit Number 18    Number of Visits 25    Date for PT Re-Evaluation 12/24/22    Progress Note Due on Visit 20    PT Start Time 1005    PT Stop Time 1049    PT Time Calculation (min) 44 min    Equipment Utilized During Treatment Gait belt    Activity Tolerance Patient tolerated treatment well    Behavior During Therapy WFL for tasks assessed/performed                         Past Medical History:  Diagnosis Date   Asthma    GERD (gastroesophageal reflux disease)    Hyperlipidemia    Hypertension    Obesity    Stroke [facility])    Past Surgical History:  Procedure Laterality Date   BRAIN SURGERY     Patient Active Problem List   Diagnosis Date Noted   Impaired glucose tolerance 03/23/2020   Learning disability 03/23/2020   Urinary and fecal incontinence 06/14/2019   Chronic pain syndrome 03/23/2019   Constipation 03/23/2019   Dysarthria 03/23/2019   Dysphagia 03/23/2019   Learning difficulty 03/23/2019   Morbid obesity (HCC) 03/23/2019   Muscle weakness 03/23/2019   Vitamin D deficiency 03/23/2019   Bilateral carotid artery stenosis 12/15/2018   Moderate aortic valve stenosis 12/15/2018   BMI 45.0-49.9, adult (HCC) 11/10/2018   Cerebrovascular accident (HCC) 05/02/2016   Hemiparesis affecting right side as late effect of cerebrovascular accident (HCC) 05/01/2016   Brain tumor (HCC) 11/24/2014   Gastro-esophageal reflux disease without esophagitis 11/24/2014   Accident due to mechanical fall without injury 08/03/2014   Essential hypertension 06/13/2014   Mixed hyperlipidemia 06/13/2014   Unspecified sequelae of cerebral infarction 06/13/2014   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/2014   Type 2 diabetes  mellitus without complication (HCC) 01/06/2014   Neck mass 09/08/2013   Swimmer's ear 09/08/2013   Erectile dysfunction 01/03/2013   Prediabetes 01/03/2013   ONSET DATE: 05/09/2016  REFERRING DIAG: W09.811 (ICD-10-CM) - Hemiplegia and hemiparesis following cerebral infarction affecting right dominant side   THERAPY DIAG:  Muscle weakness (generalized)  Difficulty in walking, not elsewhere classified  Unsteadiness on feet  Other abnormalities of gait and mobility  Left-sided weakness  Repeated falls  Rationale for Evaluation and Treatment Rehabilitation  SUBJECTIVE:  SUBJECTIVE STATEMENT  Pt reports doing well today. Pt denies any recent falls/stumbles since prior session. Pt denies any updates to medications or medical appointment since prior session. Pt reports good compliance with HEP when time permits.  Pt accompanied by: family member/caregiver  PERTINENT HISTORY:  The pt is a pleasant 57 yo male who returns to PT for evaluation of deficits s/o old CVA. Pt with hx of CVA Feb 2018. He was previously fully independent, but CVA caused significant deficits, mainly affecting his right side. Pt reports continued difficulty with walking and transfers. Pt still using WC for majority of mobility, otherwise he uses his RW. Pt lives with his mom who is his main caregiver. Patient had outpatient PT previously in clinic in 2023. He reports since discharge he has been completing his HEP and walking about every day. Pt would like to improve his walking mainly, but also would like to work on balance. He also reports difficulty with transfers. Pt's mom and his caregiver helps him with ADLs. However, he says he is now waiting for a new caregiver. Prior to this pt had caregiver 3x/week. PMH significant for hx of  brain tumor, past CVA, DMII and HTN. See chart for additional information.  PAIN:  Are you having pain? Pt reports no pain currently  PRECAUTIONS: fall  WEIGHT BEARING RESTRICTIONS none  PLOF: required assistance with ADLs (old CVA), prior to CVA in 2018 was indep.  History of Falls: Pt reports no recent falls  Home information: Pt lives with mother who is his primary caregiver. Previously had home aid, 3x/week but reports currently waiting for new one.  PATIENT GOALS: Pt would like to improve his ability to ambulate.  OBJECTIVE:   DIAGNOSTIC FINDINGS: No recent imaging per chart  Objective measures taken at eval unless otherwise specified:   Posture: forward head posture, rounded shoulders  MMT LE: grossly  4+/5, exception hip flexors and abductors 4/5 each bilat   Functional tests:  5xSTS:  34 sec, use of BUE (multiple initial attempts with posterior LOB in chair)   : 0.096 m/s with RW, close CGA, and WC follow  Gait for distance (goal 150 ft): deferred due to time  Gait mechanics: impaired, decreased speed (see ), crouched posture, poor postural stability and relies heavily on BUE support on RW. Pt now with bilat AFOS   FOTO: 44 (goal 50)    TODAY'S TREATMENT Pt session was cut a little short today due to pt arriving late to scheduled appointment time  Seated: Glute press down with into bolster 2 x 10 ea LE Bolster adductor  squeeze 2 x 10 reps x 5 sec holds  Bolster roll on floor using pt LE for HS and quad activation 2 x 10 ea   TA:  STS from WC to 2WW bariatric version x 1 to ensure height appropriate.   Ambulation x 60 feet total with WC follow, initially good maintenance of forward momentum with good hip flexion for forward momentum, with fatigue 9 around 40 ft) pt began to use lateral weight shift to offload LE and swing legs forward causing his forward momentum to be lost and gait speed and quality diminished.   The following activities were  completed in parallel bars or at balance bar   STS then march x 20, then repeat x 5 reps, cues for large marches as big as pt can tolerate, significant UE support used on // bars.   Ambulation forward and back in // bars x 8 ft,  cues for forward momentum preventing post weight shift in RLE stance phase of gait   Stance in // bars no UE support x 5 R to L weight shifts, cues for ant weight shift   Unless otherwise stated, CGA was provided and gait belt donned in order to ensure pt safety    PATIENT EDUCATION: Education details: Pt educated throughout session about proper posture and technique with exercises. Improved exercise technique, movement at target joints, use of target muscles after min to mod verbal, visual, tactile cues.   Person educated: Patient Education method: Explanation, Demonstration, Tactile cues, and Verbal cues Education comprehension: verbalized understanding, returned demonstration, and verbal cues required  HOME EXERCISE PROGRAM:  Reviewed HEP in session but unable to issue printout today due to printer error - will provide printout next visit  Access Code: [email or web address] URL: [email or web address] Date: 10/01/2022 Prepared by: Temple Pacini  Exercises - Seated March  - 1 x daily - 6 x weekly - 3 sets - 10 reps - Seated Hip Abduction  - 1 x daily - 6 x weekly - 2 sets - 10 reps  GOALS: Goals reviewed with patient? No  SHORT TERM GOALS: Target date: 11/12/2022  Pt will be independent with HEP in order to improve strength and balance in order to decrease fall risk and improve function at home. Baseline: instructed in HEP today, to provide printout next visit Goal status: MET    LONG TERM GOALS: Target date: 12/24/2022  Pt will increase FOTO to at least 50 in order to demonstrate improvement in function related to mobility and QoL. Baseline: 44 7/29:48 Goal status: NEW  2.  Pt (< 60 yrs old) will complete 5xSTS test < 30 seconds indicating an  increase in LE strength and improved balance. Baseline: 10/01/22 34 sec (multiple initial attempts with posterior LOB in chair) 12/20: 49 seconds 11/04/22:1:06, min A on multiple attempts to maintain balance and pt stays in flexed posture throughout test when coming to standing.  Goal status: ONGOING  3.  Pt will increase to > 0.5 m/s (30 sec) as to improve gait speed for better home ambulation and reduce fall risk. Baseline: 10/01/22 0.096 m/s with RW, close CGA, and WC follow previously 12/27: 0.11 m/s 11/04/22: .056 m/s Goal status: ONGOING   4.  Pt will increase hip flexor and abductor  gross strength to 4+/5 bilat as to improve functional strength for independent gait, increased standing tolerance, and increased ADL ability. Baseline: 10/01/22 4+/5, exception hip flexors and abductors 4/5 each bilat  (previously 03/27/22: 4+/5 ) Goal status: NEW  5.  Pt will ambulate 150 ft with BRW and WC follow with no rest breaks to allow for improved mobility within home and increased independence. Baseline: 10/03/22 150' with BRW 04/03/22: 105 ft with BRW 7/29: 100 in 9:30 with pediatric RW Goal status: ONGOING    ASSESSMENT :  CLINICAL IMPRESSION:  Patient presents with good motivation for completion of physical therapy activities.  Patient continues with general lower extremity strengthening exercises in order to target quads, hamstrings, glutes, and hip stability muscles.  Patient able to ambulate initially with improved speed and form but his form deteriorated following 40 feet of ambulation.  Patient targeted with endurance training for hip flexors in parallel bars in standing following this is this was the muscle group that fatigue most quickly.  This was evident in parallel bars as patient required increased cues with increased reps to maintain adequate hip flexion range of motion for appropriate stepping  strategies.Pt will continue to benefit from skilled physical therapy intervention to  address impairments, improve QOL, and attain therapy goals.     OBJECTIVE IMPAIRMENTS Abnormal gait, decreased activity tolerance, decreased balance, decreased coordination, decreased endurance, decreased mobility, difficulty walking, decreased ROM, decreased strength, improper body mechanics, postural dysfunction, and obesity.   ACTIVITY LIMITATIONS carrying, lifting, bending, sitting, standing, squatting, stairs, transfers, bed mobility, continence, bathing, toileting, dressing, hygiene/grooming, and locomotion level  PARTICIPATION LIMITATIONS: meal prep, cleaning, laundry, interpersonal relationship, driving, shopping, community activity, and yard work  PERSONAL FACTORS Fitness, Past/current experiences, Time since onset of injury/illness/exacerbation, and 3+ comorbidities: hx of brain tumor, past CVA, HTN, obesity, learning disability, DMII  are also affecting patient's functional outcome.   REHAB POTENTIAL: Fair    CLINICAL DECISION MAKING: Stable/uncomplicated  EVALUATION COMPLEXITY: Low  PLAN: PT FREQUENCY: 2x/week  PT DURATION: 12 weeks  PLANNED INTERVENTIONS: Therapeutic exercises, Therapeutic activity, Neuromuscular re-education, Balance training, Gait training, Patient/Family education, Joint mobilization, Stair training, Vestibular training, Orthotic/Fit training, DME instructions, Dry Needling, Electrical stimulation, Wheelchair mobility training, Cryotherapy, Moist heat, Compression bandaging, Manual therapy, and Re-evaluation  PLAN FOR NEXT SESSION:   continue with strengthening exercises of the LE's,  functional ambulation when appropriate. Continue plan    Norman Herrlich PT Physical Therapist- [facility]   10:59 AM 12/04/22

## 2022-12-05 ENCOUNTER — Ambulatory Visit: Payer: 59

## 2022-12-10 ENCOUNTER — Ambulatory Visit: Payer: 59

## 2022-12-12 ENCOUNTER — Ambulatory Visit: Payer: 59 | Attending: Internal Medicine | Admitting: Physical Therapy

## 2022-12-12 DIAGNOSIS — R2689 Other abnormalities of gait and mobility: Secondary | ICD-10-CM | POA: Insufficient documentation

## 2022-12-12 DIAGNOSIS — R269 Unspecified abnormalities of gait and mobility: Secondary | ICD-10-CM | POA: Insufficient documentation

## 2022-12-12 DIAGNOSIS — R531 Weakness: Secondary | ICD-10-CM | POA: Insufficient documentation

## 2022-12-12 DIAGNOSIS — R278 Other lack of coordination: Secondary | ICD-10-CM | POA: Diagnosis present

## 2022-12-12 DIAGNOSIS — R296 Repeated falls: Secondary | ICD-10-CM | POA: Diagnosis present

## 2022-12-12 DIAGNOSIS — M6281 Muscle weakness (generalized): Secondary | ICD-10-CM | POA: Diagnosis present

## 2022-12-12 DIAGNOSIS — R262 Difficulty in walking, not elsewhere classified: Secondary | ICD-10-CM | POA: Diagnosis present

## 2022-12-12 DIAGNOSIS — R2681 Unsteadiness on feet: Secondary | ICD-10-CM | POA: Diagnosis present

## 2022-12-12 NOTE — Therapy (Signed)
OUTPATIENT PHYSICAL THERAPY NEURO TREATMENT    Patient Name: Curtis Powell MRN: [id number] DOB:1965-11-18, 57 y.o., male Today's Date: 12/12/2022  PCP: Enid Baas, MD  REFERRING PROVIDER: Enid Baas, MD    PT End of Session - 12/12/22 1109     Visit Number 19    Number of Visits 25    Date for PT Re-Evaluation 12/24/22    Progress Note Due on Visit 20    PT Start Time 1106    PT Stop Time 1145    PT Time Calculation (min) 39 min    Equipment Utilized During Treatment Gait belt    Activity Tolerance Patient tolerated treatment well    Behavior During Therapy WFL for tasks assessed/performed                         Past Medical History:  Diagnosis Date   Asthma    GERD (gastroesophageal reflux disease)    Hyperlipidemia    Hypertension    Obesity    Stroke [facility])    Past Surgical History:  Procedure Laterality Date   BRAIN SURGERY     Patient Active Problem List   Diagnosis Date Noted   Impaired glucose tolerance 03/23/2020   Learning disability 03/23/2020   Urinary and fecal incontinence 06/14/2019   Chronic pain syndrome 03/23/2019   Constipation 03/23/2019   Dysarthria 03/23/2019   Dysphagia 03/23/2019   Learning difficulty 03/23/2019   Morbid obesity (HCC) 03/23/2019   Muscle weakness 03/23/2019   Vitamin D deficiency 03/23/2019   Bilateral carotid artery stenosis 12/15/2018   Moderate aortic valve stenosis 12/15/2018   BMI 45.0-49.9, adult (HCC) 11/10/2018   Cerebrovascular accident (HCC) 05/02/2016   Hemiparesis affecting right side as late effect of cerebrovascular accident (HCC) 05/01/2016   Brain tumor (HCC) 11/24/2014   Gastro-esophageal reflux disease without esophagitis 11/24/2014   Accident due to mechanical fall without injury 08/03/2014   Essential hypertension 06/13/2014   Mixed hyperlipidemia 06/13/2014   Unspecified sequelae of cerebral infarction 06/13/2014   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/2014   Type 2 diabetes  mellitus without complication (HCC) 01/06/2014   Neck mass 09/08/2013   Swimmer's ear 09/08/2013   Erectile dysfunction 01/03/2013   Prediabetes 01/03/2013   ONSET DATE: 05/09/2016  REFERRING DIAG: W41.324 (ICD-10-CM) - Hemiplegia and hemiparesis following cerebral infarction affecting right dominant side   THERAPY DIAG:  Muscle weakness (generalized)  Difficulty in walking, not elsewhere classified  Unsteadiness on feet  Other abnormalities of gait and mobility  Left-sided weakness  Repeated falls  Abnormality of gait and mobility  Rationale for Evaluation and Treatment Rehabilitation  SUBJECTIVE:  SUBJECTIVE STATEMENT  Pt reports doing well today. No new medical updates. Reports that he has had transportation issues over the last few weeks, but reports that this has been corrected. Will be attending Family reunion this weekend in [place].   Pt accompanied by: self/  PERTINENT HISTORY:  The pt is a pleasant 57 yo male who returns to PT for evaluation of deficits s/o old CVA. Pt with hx of CVA Feb 2018. He was previously fully independent, but CVA caused significant deficits, mainly affecting his right side. Pt reports continued difficulty with walking and transfers. Pt still using WC for majority of mobility, otherwise he uses his RW. Pt lives with his mom who is his main caregiver. Patient had outpatient PT previously in clinic in 2023. He reports since discharge he has been completing his HEP and walking about every day. Pt would like to improve his walking mainly, but also would like to work on balance. He also reports difficulty with transfers. Pt's mom and his caregiver helps him with ADLs. However, he says he is now waiting for a new caregiver. Prior to this pt had caregiver 3x/week. PMH  significant for hx of brain tumor, past CVA, DMII and HTN. See chart for additional information.  PAIN:  Are you having pain? Pt reports no pain currently  PRECAUTIONS: fall  WEIGHT BEARING RESTRICTIONS none  PLOF: required assistance with ADLs (old CVA), prior to CVA in 2018 was indep.  History of Falls: Pt reports no recent falls  Home information: Pt lives with mother who is his primary caregiver. Previously had home aid, 3x/week but reports currently waiting for new one.  PATIENT GOALS: Pt would like to improve his ability to ambulate.  OBJECTIVE:  taken [facility] unless otherwise noted:   DIAGNOSTIC FINDINGS: No recent imaging per chart  Objective measures taken at eval unless otherwise specified:   Posture: forward head posture, rounded shoulders  MMT LE: grossly  4+/5, exception hip flexors and abductors 4/5 each bilat   Functional tests:  5xSTS:  34 sec, use of BUE (multiple initial attempts with posterior LOB in chair)   : 0.096 m/s with RW, close CGA, and WC follow  Gait for distance (goal 150 ft): deferred due to time  Gait mechanics: impaired, decreased speed (see ), crouched posture, poor postural stability and relies heavily on BUE support on RW. Pt now with bilat AFOS   FOTO: 44 (goal 50)    TODAY'S TREATMENT  Sit<>stand x 10 throughout session with mod assist on first bout due to posterior LOB, but improved to min assist throughout session due to increased anterior weight shift.   Standing with BUE support on RW  Standing tolerance 2 x 45 sec  Reciprocal march x 10 bil  Hip abduction  x 8 bil   Forward/reverse gait 2 x 63ft with  bariatric RW, CGA from PT and cues for step length on the LLE.   5x STS with 1UE on bariatric RW and 1 UE pushing from WC arm rest. Performed x 2 bouts, 47.8 sec and then 39.3 sec     Gait with bariatric RW, CGA, and +2 for WC follow x 28ft. Min cues with fatigue to improve step height on the LLE.   Unless  otherwise stated, CGA was provided and gait belt donned in order to ensure pt safety. Tactile cues for posture and improved step length with dynamic movement.      PATIENT EDUCATION: Education details: Pt educated throughout session about proper  posture and technique with exercises. Improved exercise technique, movement at target joints, use of target muscles after min to mod verbal, visual, tactile cues.   Person educated: Patient Education method: Explanation, Demonstration, Tactile cues, and Verbal cues Education comprehension: verbalized understanding, returned demonstration, and verbal cues required  HOME EXERCISE PROGRAM:  Reviewed HEP in session but unable to issue printout today due to printer error - will provide printout next visit  Access Code: [email or web address] URL: [email or web address] Date: 10/01/2022 Prepared by: Temple Pacini  Exercises - Seated March  - 1 x daily - 6 x weekly - 3 sets - 10 reps - Seated Hip Abduction  - 1 x daily - 6 x weekly - 2 sets - 10 reps  GOALS: Goals reviewed with patient? No  SHORT TERM GOALS: Target date: 11/12/2022  Pt will be independent with HEP in order to improve strength and balance in order to decrease fall risk and improve function at home. Baseline: instructed in HEP today, to provide printout next visit Goal status: MET    LONG TERM GOALS: Target date: 12/24/2022  Pt will increase FOTO to at least 50 in order to demonstrate improvement in function related to mobility and QoL. Baseline: 44 7/29:48 Goal status: NEW  2.  Pt (< 60 yrs old) will complete 5xSTS test < 30 seconds indicating an increase in LE strength and improved balance. Baseline: 10/01/22 34 sec (multiple initial attempts with posterior LOB in chair) 12/20: 49 seconds 11/04/22:1:06, min A on multiple attempts to maintain balance and pt stays in flexed posture throughout test when coming to standing.  Goal status: ONGOING  3.  Pt will increase to > 0.5  m/s (30 sec) as to improve gait speed for better home ambulation and reduce fall risk. Baseline: 10/01/22 0.096 m/s with RW, close CGA, and WC follow previously 12/27: 0.11 m/s 11/04/22: .056 m/s Goal status: ONGOING   4.  Pt will increase hip flexor and abductor  gross strength to 4+/5 bilat as to improve functional strength for independent gait, increased standing tolerance, and increased ADL ability. Baseline: 10/01/22 4+/5, exception hip flexors and abductors 4/5 each bilat  (previously 03/27/22: 4+/5 ) Goal status: NEW  5.  Pt will ambulate 150 ft with BRW and WC follow with no rest breaks to allow for improved mobility within home and increased independence. Baseline: 10/03/22 150' with BRW 04/03/22: 105 ft with BRW 7/29: 100 in 9:30 with pediatric RW Goal status: ONGOING    ASSESSMENT :  CLINICAL IMPRESSION:  Patient presents with good motivation for completion of physical therapy activities.  Pt demonstrates improved tolerance to standing tasks and reciprocal movement patterns. PT encouraged increased step length and erect posture throughout training as well as adeqate anterior weight shift for transitional movements into and out of chair. Responded very well to instruction from PT on this day. Increased distance up to 8ft with gait training and no LOB compared to 80ft in last session.  Pt will continue to benefit from skilled physical therapy intervention to address impairments, improve QOL, and attain therapy goals.     OBJECTIVE IMPAIRMENTS Abnormal gait, decreased activity tolerance, decreased balance, decreased coordination, decreased endurance, decreased mobility, difficulty walking, decreased ROM, decreased strength, improper body mechanics, postural dysfunction, and obesity.   ACTIVITY LIMITATIONS carrying, lifting, bending, sitting, standing, squatting, stairs, transfers, bed mobility, continence, bathing, toileting, dressing, hygiene/grooming, and locomotion  level  PARTICIPATION LIMITATIONS: meal prep, cleaning, laundry, interpersonal relationship, driving, shopping, community activity, and yard work  PERSONAL FACTORS  Fitness, Past/current experiences, Time since onset of injury/illness/exacerbation, and 3+ comorbidities: hx of brain tumor, past CVA, HTN, obesity, learning disability, DMII  are also affecting patient's functional outcome.   REHAB POTENTIAL: Fair    CLINICAL DECISION MAKING: Stable/uncomplicated  EVALUATION COMPLEXITY: Low  PLAN: PT FREQUENCY: 2x/week  PT DURATION: 12 weeks  PLANNED INTERVENTIONS: Therapeutic exercises, Therapeutic activity, Neuromuscular re-education, Balance training, Gait training, Patient/Family education, Joint mobilization, Stair training, Vestibular training, Orthotic/Fit training, DME instructions, Dry Needling, Electrical stimulation, Wheelchair mobility training, Cryotherapy, Moist heat, Compression bandaging, Manual therapy, and Re-evaluation  PLAN FOR NEXT SESSION:   Progress note and goal assessment.  continue with strengthening exercises of the LE's,  functional ambulation when appropriate.     Golden Pop PT Physical Therapist- [facility]   11:09 AM 12/12/22

## 2022-12-17 ENCOUNTER — Ambulatory Visit: Payer: 59 | Admitting: Physical Therapy

## 2022-12-17 DIAGNOSIS — R2689 Other abnormalities of gait and mobility: Secondary | ICD-10-CM

## 2022-12-17 DIAGNOSIS — R262 Difficulty in walking, not elsewhere classified: Secondary | ICD-10-CM

## 2022-12-17 DIAGNOSIS — R2681 Unsteadiness on feet: Secondary | ICD-10-CM

## 2022-12-17 DIAGNOSIS — M6281 Muscle weakness (generalized): Secondary | ICD-10-CM | POA: Diagnosis not present

## 2022-12-17 NOTE — Therapy (Signed)
OUTPATIENT PHYSICAL THERAPY NEURO TREATMENT    Patient Name: Curtis Powell MRN: [id number] DOB:08-19-1965, 57 y.o., male Today's Date: 12/17/2022  PCP: Enid Baas, MD  REFERRING PROVIDER: Enid Baas, MD    PT End of Session - 12/17/22 1105     Visit Number 20    Number of Visits 25    Date for PT Re-Evaluation 12/24/22    Progress Note Due on Visit 20    PT Start Time 1104    PT Stop Time 1143    PT Time Calculation (min) 39 min    Equipment Utilized During Treatment Gait belt    Activity Tolerance Patient tolerated treatment well    Behavior During Therapy WFL for tasks assessed/performed                          Past Medical History:  Diagnosis Date   Asthma    GERD (gastroesophageal reflux disease)    Hyperlipidemia    Hypertension    Obesity    Stroke [facility])    Past Surgical History:  Procedure Laterality Date   BRAIN SURGERY     Patient Active Problem List   Diagnosis Date Noted   Impaired glucose tolerance 03/23/2020   Learning disability 03/23/2020   Urinary and fecal incontinence 06/14/2019   Chronic pain syndrome 03/23/2019   Constipation 03/23/2019   Dysarthria 03/23/2019   Dysphagia 03/23/2019   Learning difficulty 03/23/2019   Morbid obesity (HCC) 03/23/2019   Muscle weakness 03/23/2019   Vitamin D deficiency 03/23/2019   Bilateral carotid artery stenosis 12/15/2018   Moderate aortic valve stenosis 12/15/2018   BMI 45.0-49.9, adult (HCC) 11/10/2018   Cerebrovascular accident (HCC) 05/02/2016   Hemiparesis affecting right side as late effect of cerebrovascular accident (HCC) 05/01/2016   Brain tumor (HCC) 11/24/2014   Gastro-esophageal reflux disease without esophagitis 11/24/2014   Accident due to mechanical fall without injury 08/03/2014   Essential hypertension 06/13/2014   Mixed hyperlipidemia 06/13/2014   Unspecified sequelae of cerebral infarction 06/13/2014   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/2014   Type 2 diabetes  mellitus without complication (HCC) 01/06/2014   Neck mass 09/08/2013   Swimmer's ear 09/08/2013   Erectile dysfunction 01/03/2013   Prediabetes 01/03/2013   ONSET DATE: 05/09/2016  REFERRING DIAG: O96.295 (ICD-10-CM) - Hemiplegia and hemiparesis following cerebral infarction affecting right dominant side   THERAPY DIAG:  Difficulty in walking, not elsewhere classified  Unsteadiness on feet  Other abnormalities of gait and mobility  Muscle weakness (generalized)  Rationale for Evaluation and Treatment Rehabilitation  SUBJECTIVE:  SUBJECTIVE STATEMENT  Pt reports doing well today. Pt denies any recent falls/stumbles since prior session. Pt denies any updates to medications or medical appointment since prior session. Pt reports good compliance with HEP when time permits.    Pt accompanied by: self/  PERTINENT HISTORY:  The pt is a pleasant 57 yo male who returns to PT for evaluation of deficits s/o old CVA. Pt with hx of CVA Feb 2018. He was previously fully independent, but CVA caused significant deficits, mainly affecting his right side. Pt reports continued difficulty with walking and transfers. Pt still using WC for majority of mobility, otherwise he uses his RW. Pt lives with his mom who is his main caregiver. Patient had outpatient PT previously in clinic in 2023. He reports since discharge he has been completing his HEP and walking about every day. Pt would like to improve his walking mainly, but also would like to work on balance. He also reports difficulty with transfers. Pt's mom and his caregiver helps him with ADLs. However, he says he is now waiting for a new caregiver. Prior to this pt had caregiver 3x/week. PMH significant for hx of brain tumor, past CVA, DMII and HTN. See chart for  additional information.  PAIN:  Are you having pain? Pt reports no pain currently  PRECAUTIONS: fall  WEIGHT BEARING RESTRICTIONS none  PLOF: required assistance with ADLs (old CVA), prior to CVA in 2018 was indep.  History of Falls: Pt reports no recent falls  Home information: Pt lives with mother who is his primary caregiver. Previously had home aid, 3x/week but reports currently waiting for new one.  PATIENT GOALS: Pt would like to improve his ability to ambulate.  OBJECTIVE:  taken at [facility] unless otherwise noted:   DIAGNOSTIC FINDINGS: No recent imaging per chart  Objective measures taken at eval unless otherwise specified:   Posture: forward head posture, rounded shoulders  MMT LE: grossly  4+/5, exception hip flexors and abductors 4/5 each bilat   Functional tests:  5xSTS:  34 sec, use of BUE (multiple initial attempts with posterior LOB in chair)   : 0.096 m/s with RW, close CGA, and WC follow  Gait for distance (goal 150 ft): deferred due to time  Gait mechanics: impaired, decreased speed (see ), crouched posture, poor postural stability and relies heavily on BUE support on RW. Pt now with bilat AFOS   FOTO: 44 (goal 50)    TODAY'S TREATMENT  Physical therapy treatment session today consisted of completing assessment of goals and administration of testing as demonstrated and documented in flow sheet, treatment, and goals section of this note. Addition treatments may be found below.      PATIENT EDUCATION: Education details: Pt educated throughout session about proper posture and technique with exercises. Improved exercise technique, movement at target joints, use of target muscles after min to mod verbal, visual, tactile cues.   Person educated: Patient Education method: Explanation, Demonstration, Tactile cues, and Verbal cues Education comprehension: verbalized understanding, returned demonstration, and verbal cues required  HOME EXERCISE  PROGRAM:  Reviewed HEP in session but unable to issue printout today due to printer error - will provide printout next visit  Access Code: [email or web address] URL: [email or web address] Date: 10/01/2022 Prepared by: Temple Pacini  Exercises - Seated March  - 1 x daily - 6 x weekly - 3 sets - 10 reps - Seated Hip Abduction  - 1 x daily - 6 x weekly - 2 sets - 10 reps  GOALS: Goals reviewed  with patient? No  SHORT TERM GOALS: Target date: 11/12/2022  Pt will be independent with HEP in order to improve strength and balance in order to decrease fall risk and improve function at home. Baseline: instructed in HEP today, to provide printout next visit Goal status: MET    LONG TERM GOALS: Target date: 12/24/2022  Pt will increase FOTO to at least 50 in order to demonstrate improvement in function related to mobility and QoL. Baseline: 44 7/29:48 9/10:40 Goal status: NEW  2.  Pt (< 60 yrs old) will complete 5xSTS test < 30 seconds indicating an increase in LE strength and improved balance. Baseline: 10/01/22 34 sec (multiple initial attempts with posterior LOB in chair) 12/20: 49 seconds 11/04/22:1:06, min A on multiple attempts to maintain balance and pt stays in flexed posture throughout test when coming to standing.   9/10: 27.5 sec, Pushes WC post consistently, close CGA  Goal status: ONGOING  3.  Pt will increase to > 0.5 m/s (30 sec) as to improve gait speed for better home ambulation and reduce fall risk. Baseline: 10/01/22 0.096 m/s with RW, close CGA, and WC follow previously 12/27: 0.11 m/s 11/04/22: .056 m/s  9/10: 076 m/s Goal status: ONGOING   4.  Pt will increase hip flexor and abductor  gross strength to 4+/5 bilat as to improve functional strength for independent gait, increased standing tolerance, and increased ADL ability. Baseline: 10/01/22 4+/5, exception hip flexors and abductors 4/5 each bilat  (previously 03/27/22: 4+/5 ) 9/10: Hip flexor 4/5, hip abductor  4+/5 Goal status: ONGOING  5.  Pt will ambulate 150 ft with BRW and WC follow with no rest breaks to allow for improved mobility within home and increased independence. Baseline: 10/03/22 150' with BRW 04/03/22: 105 ft with BRW 7/29: 100 in 9:30 with pediatric RW 9/10:101 ft with BRW Goal status: ONGOING    ASSESSMENT :  CLINICAL IMPRESSION:      OBJECTIVE IMPAIRMENTS Abnormal gait, decreased activity tolerance, decreased balance, decreased coordination, decreased endurance, decreased mobility, difficulty walking, decreased ROM, decreased strength, improper body mechanics, postural dysfunction, and obesity.   ACTIVITY LIMITATIONS carrying, lifting, bending, sitting, standing, squatting, stairs, transfers, bed mobility, continence, bathing, toileting, dressing, hygiene/grooming, and locomotion level  PARTICIPATION LIMITATIONS: meal prep, cleaning, laundry, interpersonal relationship, driving, shopping, community activity, and yard work  PERSONAL FACTORS Fitness, Past/current experiences, Time since onset of injury/illness/exacerbation, and 3+ comorbidities: hx of brain tumor, past CVA, HTN, obesity, learning disability, DMII  are also affecting patient's functional outcome.   REHAB POTENTIAL: Fair    CLINICAL DECISION MAKING: Stable/uncomplicated  EVALUATION COMPLEXITY: Low  PLAN: PT FREQUENCY: 2x/week  PT DURATION: 12 weeks  PLANNED INTERVENTIONS: Therapeutic exercises, Therapeutic activity, Neuromuscular re-education, Balance training, Gait training, Patient/Family education, Joint mobilization, Stair training, Vestibular training, Orthotic/Fit training, DME instructions, Dry Needling, Electrical stimulation, Wheelchair mobility training, Cryotherapy, Moist heat, Compression bandaging, Manual therapy, and Re-evaluation  PLAN FOR NEXT SESSION:   Progress note and goal assessment.  continue with strengthening exercises of the LE's,  functional ambulation when  appropriate.     Norman Herrlich PT Physical Therapist- [facility]   11:05 AM 12/17/22

## 2022-12-18 ENCOUNTER — Encounter: Payer: Self-pay | Admitting: Physical Therapy

## 2022-12-19 ENCOUNTER — Ambulatory Visit: Payer: 59 | Admitting: Physical Therapy

## 2022-12-19 DIAGNOSIS — R2681 Unsteadiness on feet: Secondary | ICD-10-CM

## 2022-12-19 DIAGNOSIS — M6281 Muscle weakness (generalized): Secondary | ICD-10-CM

## 2022-12-19 DIAGNOSIS — R2689 Other abnormalities of gait and mobility: Secondary | ICD-10-CM

## 2022-12-19 DIAGNOSIS — R262 Difficulty in walking, not elsewhere classified: Secondary | ICD-10-CM

## 2022-12-19 NOTE — Therapy (Signed)
OUTPATIENT PHYSICAL THERAPY NEURO TREATMENT      Patient Name: Curtis Powell MRN: [id number] DOB:09/25/1965, 57 y.o., male Today's Date: 12/19/2022  PCP: Enid Baas, MD  REFERRING PROVIDER: Enid Baas, MD    PT End of Session - 12/19/22 1411     Visit Number 21    Number of Visits 25    Date for PT Re-Evaluation 12/24/22    Progress Note Due on Visit 30    PT Start Time 1401    PT Stop Time 1444    PT Time Calculation (min) 43 min    Equipment Utilized During Treatment Gait belt    Activity Tolerance Patient tolerated treatment well    Behavior During Therapy WFL for tasks assessed/performed                           Past Medical History:  Diagnosis Date   Asthma    GERD (gastroesophageal reflux disease)    Hyperlipidemia    Hypertension    Obesity    Stroke [facility])    Past Surgical History:  Procedure Laterality Date   BRAIN SURGERY     Patient Active Problem List   Diagnosis Date Noted   Impaired glucose tolerance 03/23/2020   Learning disability 03/23/2020   Urinary and fecal incontinence 06/14/2019   Chronic pain syndrome 03/23/2019   Constipation 03/23/2019   Dysarthria 03/23/2019   Dysphagia 03/23/2019   Learning difficulty 03/23/2019   Morbid obesity (HCC) 03/23/2019   Muscle weakness 03/23/2019   Vitamin D deficiency 03/23/2019   Bilateral carotid artery stenosis 12/15/2018   Moderate aortic valve stenosis 12/15/2018   BMI 45.0-49.9, adult (HCC) 11/10/2018   Cerebrovascular accident (HCC) 05/02/2016   Hemiparesis affecting right side as late effect of cerebrovascular accident (HCC) 05/01/2016   Brain tumor (HCC) 11/24/2014   Gastro-esophageal reflux disease without esophagitis 11/24/2014   Accident due to mechanical fall without injury 08/03/2014   Essential hypertension 06/13/2014   Mixed hyperlipidemia 06/13/2014   Unspecified sequelae of cerebral infarction 06/13/2014   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/2014   Type 2  diabetes mellitus without complication (HCC) 01/06/2014   Neck mass 09/08/2013   Swimmer's ear 09/08/2013   Erectile dysfunction 01/03/2013   Prediabetes 01/03/2013   ONSET DATE: 05/09/2016  REFERRING DIAG: W41.324 (ICD-10-CM) - Hemiplegia and hemiparesis following cerebral infarction affecting right dominant side   THERAPY DIAG:  Difficulty in walking, not elsewhere classified  Unsteadiness on feet  Other abnormalities of gait and mobility  Muscle weakness (generalized)  Rationale for Evaluation and Treatment Rehabilitation  SUBJECTIVE:  SUBJECTIVE STATEMENT  Pt reports doing well today. Pt denies any recent falls/stumbles since prior session. Pt denies any updates to medications or medical appointment since prior session. Pt reports good compliance with HEP when time permits.    Pt accompanied by: self/  PERTINENT HISTORY:  The pt is a pleasant 57 yo male who returns to PT for evaluation of deficits s/o old CVA. Pt with hx of CVA Feb 2018. He was previously fully independent, but CVA caused significant deficits, mainly affecting his right side. Pt reports continued difficulty with walking and transfers. Pt still using WC for majority of mobility, otherwise he uses his RW. Pt lives with his mom who is his main caregiver. Patient had outpatient PT previously in clinic in 2023. He reports since discharge he has been completing his HEP and walking about every day. Pt would like to improve his walking mainly, but also would like to work on balance. He also reports difficulty with transfers. Pt's mom and his caregiver helps him with ADLs. However, he says he is now waiting for a new caregiver. Prior to this pt had caregiver 3x/week. PMH significant for hx of brain tumor, past CVA, DMII and HTN. See chart  for additional information.  PAIN:  Are you having pain? Pt reports no pain currently  PRECAUTIONS: fall  WEIGHT BEARING RESTRICTIONS none  PLOF: required assistance with ADLs (old CVA), prior to CVA in 2018 was indep.  History of Falls: Pt reports no recent falls  Home information: Pt lives with mother who is his primary caregiver. Previously had home aid, 3x/week but reports currently waiting for new one.  PATIENT GOALS: Pt would like to improve his ability to ambulate.  OBJECTIVE:  taken at [facility] unless otherwise noted:   DIAGNOSTIC FINDINGS: No recent imaging per chart  Objective measures taken at eval unless otherwise specified:   Posture: forward head posture, rounded shoulders  MMT LE: grossly  4+/5, exception hip flexors and abductors 4/5 each bilat   Functional tests:  5xSTS:  34 sec, use of BUE (multiple initial attempts with posterior LOB in chair)   : 0.096 m/s with RW, close CGA, and WC follow  Gait for distance (goal 150 ft): deferred due to time  Gait mechanics: impaired, decreased speed (see ), crouched posture, poor postural stability and relies heavily on BUE support on RW. Pt now with bilat AFOS   FOTO: 44 (goal 50)    TODAY'S TREATMENT  Transfer from standard WC to Nustep using BRW. Poor ability to get to standing this date.   TE  Nustep LE only for extensor activation and strength level 2 x 3 min, level 4 x 4 min  - close assistance with getting off machine then ambulating to // bars    The following activities were completed in parallel bars or at balance bar, focussed on challenging pt to stand for longer periods to work on postural extension endurance  - ambulation forward / back x 2 laps,  standing posture without UE support 3 x 20 sec (physical assist needed on last set) Standing march x 10 ea LE Glute set standing x 10 reps with cues from PT  - seated rest  Standing lateral rocks x 20   mini squats x 20 with focus on postural  extension attempted hip flexor stretch but pt endurance would not allow and he required a seated break.       PATIENT EDUCATION: Education details: Pt educated throughout session about proper posture and technique  with exercises. Improved exercise technique, movement at target joints, use of target muscles after min to mod verbal, visual, tactile cues.   Person educated: Patient Education method: Explanation, Demonstration, Tactile cues, and Verbal cues Education comprehension: verbalized understanding, returned demonstration, and verbal cues required  HOME EXERCISE PROGRAM:  Reviewed HEP in session but unable to issue printout today due to printer error - will provide printout next visit  Access Code: [email or web address] URL: [email or web address] Date: 10/01/2022 Prepared by: Temple Pacini  Exercises - Seated March  - 1 x daily - 6 x weekly - 3 sets - 10 reps - Seated Hip Abduction  - 1 x daily - 6 x weekly - 2 sets - 10 reps  GOALS: Goals reviewed with patient? No  SHORT TERM GOALS: Target date: 11/12/2022  Pt will be independent with HEP in order to improve strength and balance in order to decrease fall risk and improve function at home. Baseline: instructed in HEP today, to provide printout next visit Goal status: MET    LONG TERM GOALS: Target date: 12/24/2022  Pt will increase FOTO to at least 50 in order to demonstrate improvement in function related to mobility and QoL. Baseline: 44 7/29:48 9/10:40 Goal status: ONGOING  2.  Pt (< 60 yrs old) will complete 5xSTS test < 30 seconds indicating an increase in LE strength and improved balance. Baseline: 10/01/22 34 sec (multiple initial attempts with posterior LOB in chair) 12/20: 49 seconds 11/04/22:1:06, min A on multiple attempts to maintain balance and pt stays in flexed posture throughout test when coming to standing.   9/10: 27.5 sec, Pushes WC post consistently, close CGA  Goal status: ONGOING  3.  Pt will  increase to > 0.5 m/s (30 sec) as to improve gait speed for better home ambulation and reduce fall risk. Baseline: 10/01/22 0.096 m/s with RW, close CGA, and WC follow previously 12/27: 0.11 m/s 11/04/22: .056 m/s  9/10: 076 m/s Goal status: ONGOING   4.  Pt will increase hip flexor and abductor  gross strength to 4+/5 bilat as to improve functional strength for independent gait, increased standing tolerance, and increased ADL ability. Baseline: 10/01/22 4+/5, exception hip flexors and abductors 4/5 each bilat  (previously 03/27/22: 4+/5 ) 9/10: Hip flexor 4/5, hip abductor 4+/5 Goal status: ONGOING  5.  Pt will ambulate 150 ft with BRW and WC follow with no rest breaks to allow for improved mobility within home and increased independence. Baseline: 10/03/22 150' with BRW 04/03/22: 105 ft with BRW 7/29: 100 in 9:30 with pediatric RW 9/10:101 ft with BRW stooped posture worsened throughout test Goal status: ONGOING    ASSESSMENT :  CLINICAL IMPRESSION:   Patient arrived with good motivation form completion of pt activities.  Pt challenged with endurance activities working on prolonged use of postural extensors and LE reciprocal movement for carryover to improved endurance with ambulation and gait. Pt was fatigued to the point of requiring seated rest on 2 occasions ( pt unable to remain standing position without hip flexion. Pt was initially challenged with STS showing continued difficulty with forward weight shift with initial stands following prolonged sitting. Pt will continue to benefit from skilled physical therapy intervention to address impairments, improve QOL, and attain therapy goals.    OBJECTIVE IMPAIRMENTS Abnormal gait, decreased activity tolerance, decreased balance, decreased coordination, decreased endurance, decreased mobility, difficulty walking, decreased ROM, decreased strength, improper body mechanics, postural dysfunction, and obesity.   ACTIVITY LIMITATIONS  carrying, lifting, bending, sitting, standing, squatting,  stairs, transfers, bed mobility, continence, bathing, toileting, dressing, hygiene/grooming, and locomotion level  PARTICIPATION LIMITATIONS: meal prep, cleaning, laundry, interpersonal relationship, driving, shopping, community activity, and yard work  PERSONAL FACTORS Fitness, Past/current experiences, Time since onset of injury/illness/exacerbation, and 3+ comorbidities: hx of brain tumor, past CVA, HTN, obesity, learning disability, DMII  are also affecting patient's functional outcome.   REHAB POTENTIAL: Fair    CLINICAL DECISION MAKING: Stable/uncomplicated  EVALUATION COMPLEXITY: Low  PLAN: PT FREQUENCY: 2x/week  PT DURATION: 12 weeks  PLANNED INTERVENTIONS: Therapeutic exercises, Therapeutic activity, Neuromuscular re-education, Balance training, Gait training, Patient/Family education, Joint mobilization, Stair training, Vestibular training, Orthotic/Fit training, DME instructions, Dry Needling, Electrical stimulation, Wheelchair mobility training, Cryotherapy, Moist heat, Compression bandaging, Manual therapy, and Re-evaluation  PLAN FOR NEXT SESSION:  As much as possible / is tolerated have patient stand with good posture during his rest breaks. continue with strengthening exercises of the LE's,  functional ambulation when appropriate.     Norman Herrlich PT Physical Therapist- [place]  [facility]   4:17 PM 12/19/22

## 2022-12-24 ENCOUNTER — Ambulatory Visit: Payer: 59

## 2022-12-24 DIAGNOSIS — R2689 Other abnormalities of gait and mobility: Secondary | ICD-10-CM

## 2022-12-24 DIAGNOSIS — R531 Weakness: Secondary | ICD-10-CM

## 2022-12-24 DIAGNOSIS — R296 Repeated falls: Secondary | ICD-10-CM

## 2022-12-24 DIAGNOSIS — M6281 Muscle weakness (generalized): Secondary | ICD-10-CM | POA: Diagnosis not present

## 2022-12-24 DIAGNOSIS — R278 Other lack of coordination: Secondary | ICD-10-CM

## 2022-12-24 DIAGNOSIS — R269 Unspecified abnormalities of gait and mobility: Secondary | ICD-10-CM

## 2022-12-24 DIAGNOSIS — R2681 Unsteadiness on feet: Secondary | ICD-10-CM

## 2022-12-24 DIAGNOSIS — R262 Difficulty in walking, not elsewhere classified: Secondary | ICD-10-CM

## 2022-12-24 NOTE — Therapy (Signed)
OUTPATIENT PHYSICAL THERAPY NEURO TREATMENT      Patient Name: Curtis Powell MRN: [id number] DOB:1965-05-13, 57 y.o., male Today's Date: 12/25/2022  PCP: Enid Baas, MD  REFERRING PROVIDER: Enid Baas, MD    PT End of Session - 12/24/22 1322     Visit Number 22    Number of Visits 46    Date for PT Re-Evaluation 03/18/23    Progress Note Due on Visit 30    PT Start Time 1316    PT Stop Time 1359    PT Time Calculation (min) 43 min    Equipment Utilized During Treatment Gait belt    Activity Tolerance Patient tolerated treatment well    Behavior During Therapy WFL for tasks assessed/performed                           Past Medical History:  Diagnosis Date   Asthma    GERD (gastroesophageal reflux disease)    Hyperlipidemia    Hypertension    Obesity    Stroke [facility])    Past Surgical History:  Procedure Laterality Date   BRAIN SURGERY     Patient Active Problem List   Diagnosis Date Noted   Impaired glucose tolerance 03/23/2020   Learning disability 03/23/2020   Urinary and fecal incontinence 06/14/2019   Chronic pain syndrome 03/23/2019   Constipation 03/23/2019   Dysarthria 03/23/2019   Dysphagia 03/23/2019   Learning difficulty 03/23/2019   Morbid obesity (HCC) 03/23/2019   Muscle weakness 03/23/2019   Vitamin D deficiency 03/23/2019   Bilateral carotid artery stenosis 12/15/2018   Moderate aortic valve stenosis 12/15/2018   BMI 45.0-49.9, adult (HCC) 11/10/2018   Cerebrovascular accident (HCC) 05/02/2016   Hemiparesis affecting right side as late effect of cerebrovascular accident (HCC) 05/01/2016   Brain tumor (HCC) 11/24/2014   Gastro-esophageal reflux disease without esophagitis 11/24/2014   Accident due to mechanical fall without injury 08/03/2014   Essential hypertension 06/13/2014   Mixed hyperlipidemia 06/13/2014   Unspecified sequelae of cerebral infarction 06/13/2014   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/2014   Type 2  diabetes mellitus without complication (HCC) 01/06/2014   Neck mass 09/08/2013   Swimmer's ear 09/08/2013   Erectile dysfunction 01/03/2013   Prediabetes 01/03/2013   ONSET DATE: 05/09/2016  REFERRING DIAG: W09.811 (ICD-10-CM) - Hemiplegia and hemiparesis following cerebral infarction affecting right dominant side   THERAPY DIAG:  Difficulty in walking, not elsewhere classified  Unsteadiness on feet  Other abnormalities of gait and mobility  Muscle weakness (generalized)  Left-sided weakness  Repeated falls  Abnormality of gait and mobility  Other lack of coordination  Rationale for Evaluation and Treatment Rehabilitation  SUBJECTIVE:  SUBJECTIVE STATEMENT  Pt reports fall in bathroom trying to pull his pants up. Denied any pain or medical attention. "I just lost my balance and fell over."   Pt accompanied by: self/  PERTINENT HISTORY:  The pt is a pleasant 57 yo male who returns to PT for evaluation of deficits s/o old CVA. Pt with hx of CVA Feb 2018. He was previously fully independent, but CVA caused significant deficits, mainly affecting his right side. Pt reports continued difficulty with walking and transfers. Pt still using WC for majority of mobility, otherwise he uses his RW. Pt lives with his mom who is his main caregiver. Patient had outpatient PT previously in clinic in 2023. He reports since discharge he has been completing his HEP and walking about every day. Pt would like to improve his walking mainly, but also would like to work on balance. He also reports difficulty with transfers. Pt's mom and his caregiver helps him with ADLs. However, he says he is now waiting for a new caregiver. Prior to this pt had caregiver 3x/week. PMH significant for hx of brain tumor, past CVA, DMII  and HTN. See chart for additional information.  PAIN:  Are you having pain? Pt reports no pain currently  PRECAUTIONS: fall  WEIGHT BEARING RESTRICTIONS none  PLOF: required assistance with ADLs (old CVA), prior to CVA in 2018 was indep.  History of Falls: Pt reports no recent falls  Home information: Pt lives with mother who is his primary caregiver. Previously had home aid, 3x/week but reports currently waiting for new one.  PATIENT GOALS: Pt would like to improve his ability to ambulate.  OBJECTIVE:  taken at [facility] unless otherwise noted:   DIAGNOSTIC FINDINGS: No recent imaging per chart  Objective measures taken at eval unless otherwise specified:   Posture: forward head posture, rounded shoulders  MMT LE: grossly  4+/5, exception hip flexors and abductors 4/5 each bilat   Functional tests:  5xSTS:  34 sec, use of BUE (multiple initial attempts with posterior LOB in chair)   : 0.096 m/s with RW, close CGA, and WC follow  Gait for distance (goal 150 ft): deferred due to time  Gait mechanics: impaired, decreased speed (see ), crouched posture, poor postural stability and relies heavily on BUE support on RW. Pt now with bilat AFOS   FOTO: 44 (goal 50)    TODAY'S TREATMENT  Physical therapy treatment session today consisted of completing assessment of goals and administration of testing as demonstrated and documented in flow sheet, treatment, and goals section of this note. Addition treatments may be found below.   THEREX:   Seated hip march 2 x10 reps each LE Seated knee ext 2 x 10 reps each LE (VC for full ROM as possible)     PATIENT EDUCATION: Education details: Pt educated throughout session about proper posture and technique with exercises. Improved exercise technique, movement at target joints, use of target muscles after min to mod verbal, visual, tactile cues.   Person educated: Patient Education method: Explanation, Demonstration, Tactile  cues, and Verbal cues Education comprehension: verbalized understanding, returned demonstration, and verbal cues required  HOME EXERCISE PROGRAM:  Reviewed HEP in session but unable to issue printout today due to printer error - will provide printout next visit  Access Code: [email or web address] URL: [email or web address] Date: 10/01/2022 Prepared by: Temple Pacini  Exercises - Seated March  - 1 x daily - 6 x weekly - 3 sets - 10 reps - Seated Hip Abduction  -  1 x daily - 6 x weekly - 2 sets - 10 reps  GOALS: Goals reviewed with patient? No  SHORT TERM GOALS: Target date: 11/12/2022  Pt will be independent with HEP in order to improve strength and balance in order to decrease fall risk and improve function at home. Baseline: instructed in HEP today, to provide printout next visit Goal status: MET    LONG TERM GOALS: Target date: 03/18/2023  Pt will increase FOTO to at least 50 in order to demonstrate improvement in function related to mobility and QoL. Baseline: 44 7/29:48 9/10:40 12/24/2022= 51 Goal status: MET  2.  Pt (< 60 yrs old) will complete 5xSTS test < 30 seconds indicating an increase in LE strength and improved balance. Baseline: 10/01/22 34 sec (multiple initial attempts with posterior LOB in chair) 12/20: 49 seconds 11/04/22:1:06, min A on multiple attempts to maintain balance and pt stays in flexed posture throughout test when coming to standing.   9/10: 27.5 sec, Pushes WC post consistently, close CGA  Goal status: ONGOING  3.  Pt will increase to > 0.5 m/s (30 sec) as to improve gait speed for better home ambulation and reduce fall risk. Baseline: 10/01/22 0.096 m/s with RW, close CGA, and WC follow previously 12/27: 0.11 m/s 11/04/22: .056 m/s  9/10: 076 m/s Goal status: ONGOING   4.  Pt will increase hip flexor and abductor  gross strength to 4+/5 bilat as to improve functional strength for independent gait, increased standing tolerance, and increased ADL  ability. Baseline: 10/01/22 4+/5, exception hip flexors and abductors 4/5 each bilat  (previously 03/27/22: 4+/5 ) 9/10: Hip flexor 4/5, hip abductor 4+/5 Goal status: ONGOING  5.  Pt will ambulate 150 ft with BRW and WC follow with no rest breaks to allow for improved mobility within home and increased independence. Baseline: 10/03/22 150' with BRW 04/03/22: 105 ft with BRW 7/29: 100 in 9:30 with pediatric RW 9/10:101 ft with BRW stooped posture worsened throughout test 12/24/2022- 150 feet using BRW, CGA with w/c follow- in 10 min 17 sec. Will keep goal to make sure he can do this consistently Goal status: ONGOING    ASSESSMENT :  CLINICAL IMPRESSION:  Patient presents with good effort for recert visit today. Did not reassess all goals secondary to goals were just assessed on 9/10 and still appropriate for new cert. Did reassess his endurance walking goal and FOTO today. He met his FOTO goal indicating improved self perceived function and confidence in his balance. He also demonstrated much improved gait ability- able to walk 150 feet yet required increased time and many VC for posture. He will need to add consistency to ensure his goals his met and will revise the goal to state consistently as he has been inconsistent in ability. He continues to need work on his functional strength as seen by latest 5x STS test and continue to work on safe transfers to decrease risk of falling. Patient's condition has the potential to improve in response to therapy. Maximum improvement is yet to be obtained. The anticipated improvement is attainable and reasonable in a generally predictable time.  Pt will continue to benefit from skilled physical therapy intervention to address impairments, improve QOL, and attain therapy goals.    OBJECTIVE IMPAIRMENTS Abnormal gait, decreased activity tolerance, decreased balance, decreased coordination, decreased endurance, decreased mobility, difficulty walking, decreased  ROM, decreased strength, improper body mechanics, postural dysfunction, and obesity.   ACTIVITY LIMITATIONS carrying, lifting, bending, sitting, standing, squatting, stairs,  transfers, bed mobility, continence, bathing, toileting, dressing, hygiene/grooming, and locomotion level  PARTICIPATION LIMITATIONS: meal prep, cleaning, laundry, interpersonal relationship, driving, shopping, community activity, and yard work  PERSONAL FACTORS Fitness, Past/current experiences, Time since onset of injury/illness/exacerbation, and 3+ comorbidities: hx of brain tumor, past CVA, HTN, obesity, learning disability, DMII  are also affecting patient's functional outcome.   REHAB POTENTIAL: Fair    CLINICAL DECISION MAKING: Stable/uncomplicated  EVALUATION COMPLEXITY: Low  PLAN: PT FREQUENCY: 1-2x/week  PT DURATION: 12 weeks  PLANNED INTERVENTIONS: Therapeutic exercises, Therapeutic activity, Neuromuscular re-education, Balance training, Gait training, Patient/Family education, Joint mobilization, Stair training, Vestibular training, Orthotic/Fit training, DME instructions, Dry Needling, Electrical stimulation, Wheelchair mobility training, Cryotherapy, Moist heat, Compression bandaging, Manual therapy, and Re-evaluation  PLAN FOR NEXT SESSION:  As much as possible / is tolerated have patient stand with good posture during his rest breaks. continue with strengthening exercises of the LE's,  functional ambulation when appropriate.     Lenda Kelp PT Physical Therapist- [facility]   7:08 AM 12/25/22

## 2022-12-26 ENCOUNTER — Ambulatory Visit: Payer: 59

## 2022-12-26 DIAGNOSIS — M6281 Muscle weakness (generalized): Secondary | ICD-10-CM | POA: Diagnosis not present

## 2022-12-26 DIAGNOSIS — R269 Unspecified abnormalities of gait and mobility: Secondary | ICD-10-CM

## 2022-12-26 DIAGNOSIS — R262 Difficulty in walking, not elsewhere classified: Secondary | ICD-10-CM

## 2022-12-26 DIAGNOSIS — R278 Other lack of coordination: Secondary | ICD-10-CM

## 2022-12-26 DIAGNOSIS — R2689 Other abnormalities of gait and mobility: Secondary | ICD-10-CM

## 2022-12-26 DIAGNOSIS — R2681 Unsteadiness on feet: Secondary | ICD-10-CM

## 2022-12-26 NOTE — Therapy (Signed)
OUTPATIENT PHYSICAL THERAPY NEURO TREATMENT      Patient Name: Curtis Powell MRN: [id number] DOB:05/21/1965, 57 y.o., male Today's Date: 12/26/2022  PCP: Enid Baas, MD  REFERRING PROVIDER: Enid Baas, MD    PT End of Session - 12/26/22 1020     Visit Number 23    Number of Visits 46    Date for PT Re-Evaluation 03/18/23    Progress Note Due on Visit 30    PT Start Time 1023    PT Stop Time 1059    PT Time Calculation (min) 36 min    Equipment Utilized During Treatment Gait belt    Activity Tolerance Patient tolerated treatment well    Behavior During Therapy WFL for tasks assessed/performed                           Past Medical History:  Diagnosis Date   Asthma    GERD (gastroesophageal reflux disease)    Hyperlipidemia    Hypertension    Obesity    Stroke [facility])    Past Surgical History:  Procedure Laterality Date   BRAIN SURGERY     Patient Active Problem List   Diagnosis Date Noted   Impaired glucose tolerance 03/23/2020   Learning disability 03/23/2020   Urinary and fecal incontinence 06/14/2019   Chronic pain syndrome 03/23/2019   Constipation 03/23/2019   Dysarthria 03/23/2019   Dysphagia 03/23/2019   Learning difficulty 03/23/2019   Morbid obesity (HCC) 03/23/2019   Muscle weakness 03/23/2019   Vitamin D deficiency 03/23/2019   Bilateral carotid artery stenosis 12/15/2018   Moderate aortic valve stenosis 12/15/2018   BMI 45.0-49.9, adult (HCC) 11/10/2018   Cerebrovascular accident (HCC) 05/02/2016   Hemiparesis affecting right side as late effect of cerebrovascular accident (HCC) 05/01/2016   Brain tumor (HCC) 11/24/2014   Gastro-esophageal reflux disease without esophagitis 11/24/2014   Accident due to mechanical fall without injury 08/03/2014   Essential hypertension 06/13/2014   Mixed hyperlipidemia 06/13/2014   Unspecified sequelae of cerebral infarction 06/13/2014   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/2014   Type 2  diabetes mellitus without complication (HCC) 01/06/2014   Neck mass 09/08/2013   Swimmer's ear 09/08/2013   Erectile dysfunction 01/03/2013   Prediabetes 01/03/2013   ONSET DATE: 05/09/2016  REFERRING DIAG: W09.811 (ICD-10-CM) - Hemiplegia and hemiparesis following cerebral infarction affecting right dominant side   THERAPY DIAG:  Difficulty in walking, not elsewhere classified  Unsteadiness on feet  Other abnormalities of gait and mobility  Muscle weakness (generalized)  Abnormality of gait and mobility  Other lack of coordination  Rationale for Evaluation and Treatment Rehabilitation  SUBJECTIVE:  SUBJECTIVE STATEMENT  Patient reports he was pleased after his effort last visit.   Pt accompanied by: self/  PERTINENT HISTORY:  The pt is a pleasant 57 yo male who returns to PT for evaluation of deficits s/o old CVA. Pt with hx of CVA Feb 2018. He was previously fully independent, but CVA caused significant deficits, mainly affecting his right side. Pt reports continued difficulty with walking and transfers. Pt still using WC for majority of mobility, otherwise he uses his RW. Pt lives with his mom who is his main caregiver. Patient had outpatient PT previously in clinic in 2023. He reports since discharge he has been completing his HEP and walking about every day. Pt would like to improve his walking mainly, but also would like to work on balance. He also reports difficulty with transfers. Pt's mom and his caregiver helps him with ADLs. However, he says he is now waiting for a new caregiver. Prior to this pt had caregiver 3x/week. PMH significant for hx of brain tumor, past CVA, DMII and HTN. See chart for additional information.  PAIN:  Are you having pain? Pt reports no pain  currently  PRECAUTIONS: fall  WEIGHT BEARING RESTRICTIONS none  PLOF: required assistance with ADLs (old CVA), prior to CVA in 2018 was indep.  History of Falls: Pt reports no recent falls  Home information: Pt lives with mother who is his primary caregiver. Previously had home aid, 3x/week but reports currently waiting for new one.  PATIENT GOALS: Pt would like to improve his ability to ambulate.  OBJECTIVE:  taken at [facility] unless otherwise noted:   DIAGNOSTIC FINDINGS: No recent imaging per chart  Objective measures taken at eval unless otherwise specified:   Posture: forward head posture, rounded shoulders  MMT LE: grossly  4+/5, exception hip flexors and abductors 4/5 each bilat   Functional tests:  5xSTS:  34 sec, use of BUE (multiple initial attempts with posterior LOB in chair)   : 0.096 m/s with RW, close CGA, and WC follow  Gait for distance (goal 150 ft): deferred due to time  Gait mechanics: impaired, decreased speed (see ), crouched posture, poor postural stability and relies heavily on BUE support on RW. Pt now with bilat AFOS   FOTO: 44 (goal 50)    TODAY'S TREATMENT  12/26/2022  THEREX:   Seated hip march 2 x10 reps each LE Seated knee ext 2 x 10 reps each LE (VC for full ROM as possible)   Therapeutic Activities:   Static standing in // bars without  UE x 30 sec x 4 sets (rest with holding 30 sec in between holds)  Static standing in //  bars with UE support x 1 min x 3 trials (VC for posture- to look ahead)  Forward walking down the // bars then walking back x 4 total-VC for increased retro step on left as able Sit to stand- focusing on hand placement- Patient with difficulty pushing up- falling back posteriorly on a couple of trials     PATIENT EDUCATION: Education details: Pt educated throughout session about proper posture and technique with exercises. Improved exercise technique, movement at target joints, use of target muscles  after min to mod verbal, visual, tactile cues.   Person educated: Patient Education method: Explanation, Demonstration, Tactile cues, and Verbal cues Education comprehension: verbalized understanding, returned demonstration, and verbal cues required  HOME EXERCISE PROGRAM:  Reviewed HEP in session but unable to issue printout today due to printer error - will provide  printout next visit  Access Code: [email or web address] URL: [email or web address] Date: 10/01/2022 Prepared by: Temple Pacini  Exercises - Seated March  - 1 x daily - 6 x weekly - 3 sets - 10 reps - Seated Hip Abduction  - 1 x daily - 6 x weekly - 2 sets - 10 reps  GOALS: Goals reviewed with patient? No  SHORT TERM GOALS: Target date: 11/12/2022  Pt will be independent with HEP in order to improve strength and balance in order to decrease fall risk and improve function at home. Baseline: instructed in HEP today, to provide printout next visit Goal status: MET    LONG TERM GOALS: Target date: 03/18/2023  Pt will increase FOTO to at least 50 in order to demonstrate improvement in function related to mobility and QoL. Baseline: 44 7/29:48 9/10:40 12/24/2022= 51 Goal status: MET  2.  Pt (< 60 yrs old) will complete 5xSTS test < 30 seconds indicating an increase in LE strength and improved balance. Baseline: 10/01/22 34 sec (multiple initial attempts with posterior LOB in chair) 12/20: 49 seconds 11/04/22:1:06, min A on multiple attempts to maintain balance and pt stays in flexed posture throughout test when coming to standing.   9/10: 27.5 sec, Pushes WC post consistently, close CGA  Goal status: ONGOING  3.  Pt will increase to > 0.5 m/s (30 sec) as to improve gait speed for better home ambulation and reduce fall risk. Baseline: 10/01/22 0.096 m/s with RW, close CGA, and WC follow previously 12/27: 0.11 m/s 11/04/22: .056 m/s  9/10: 076 m/s Goal status: ONGOING   4.  Pt will increase hip flexor and abductor   gross strength to 4+/5 bilat as to improve functional strength for independent gait, increased standing tolerance, and increased ADL ability. Baseline: 10/01/22 4+/5, exception hip flexors and abductors 4/5 each bilat  (previously 03/27/22: 4+/5 ) 9/10: Hip flexor 4/5, hip abductor 4+/5 Goal status: ONGOING  5.  Pt will ambulate 150 ft with BRW and WC follow with no rest breaks to allow for improved mobility within home and increased independence. Baseline: 10/03/22 150' with BRW 04/03/22: 105 ft with BRW 7/29: 100 in 9:30 with pediatric RW 9/10:101 ft with BRW stooped posture worsened throughout test 12/24/2022- 150 feet using BRW, CGA with w/c follow- in 10 min 17 sec. Will keep goal to make sure he can do this consistently Goal status: ONGOING    ASSESSMENT :  CLINICAL IMPRESSION:  Patient presented with good motivation today and responded well to verbal cues for improved posture and for improved sit to stand ability without requiring pulling up from bars. He was challenged initially with static stand- some unsteadiness but improved with each trial. Continues to exhibit poor erect posture with standing and will remain an area of focus.  Pt will continue to benefit from skilled physical therapy intervention to address impairments, improve QOL, and attain therapy goals.    OBJECTIVE IMPAIRMENTS Abnormal gait, decreased activity tolerance, decreased balance, decreased coordination, decreased endurance, decreased mobility, difficulty walking, decreased ROM, decreased strength, improper body mechanics, postural dysfunction, and obesity.   ACTIVITY LIMITATIONS carrying, lifting, bending, sitting, standing, squatting, stairs, transfers, bed mobility, continence, bathing, toileting, dressing, hygiene/grooming, and locomotion level  PARTICIPATION LIMITATIONS: meal prep, cleaning, laundry, interpersonal relationship, driving, shopping, community activity, and yard work  PERSONAL FACTORS Fitness,  Past/current experiences, Time since onset of injury/illness/exacerbation, and 3+ comorbidities: hx of brain tumor, past CVA, HTN, obesity, learning disability, DMII  are also affecting patient's functional outcome.  REHAB POTENTIAL: Fair    CLINICAL DECISION MAKING: Stable/uncomplicated  EVALUATION COMPLEXITY: Low  PLAN: PT FREQUENCY: 1-2x/week  PT DURATION: 12 weeks  PLANNED INTERVENTIONS: Therapeutic exercises, Therapeutic activity, Neuromuscular re-education, Balance training, Gait training, Patient/Family education, Joint mobilization, Stair training, Vestibular training, Orthotic/Fit training, DME instructions, Dry Needling, Electrical stimulation, Wheelchair mobility training, Cryotherapy, Moist heat, Compression bandaging, Manual therapy, and Re-evaluation  PLAN FOR NEXT SESSION:  As much as possible / is tolerated have patient stand with good posture during his rest breaks. continue with strengthening exercises of the LE's,  functional ambulation when appropriate.     Lenda Kelp PT Physical Therapist- [place]  [facility]   12:57 PM 12/26/22

## 2022-12-31 ENCOUNTER — Ambulatory Visit: Payer: 59

## 2022-12-31 DIAGNOSIS — M6281 Muscle weakness (generalized): Secondary | ICD-10-CM | POA: Diagnosis not present

## 2022-12-31 DIAGNOSIS — R2681 Unsteadiness on feet: Secondary | ICD-10-CM

## 2022-12-31 NOTE — Therapy (Signed)
OUTPATIENT PHYSICAL THERAPY NEURO TREATMENT      Patient Name: Curtis Powell MRN: [id number] DOB:[birth year], 57 y.o., male Today's Date: 12/31/2022  PCP: Enid Baas, MD  REFERRING PROVIDER: Enid Baas, MD    PT End of Session - 12/31/22 1045     Visit Number 24    Number of Visits 46    Date for PT Re-Evaluation 03/18/23    Progress Note Due on Visit 30    PT Start Time 1055    PT Stop Time 1136    PT Time Calculation (min) 41 min    Equipment Utilized During Treatment Gait belt    Activity Tolerance Patient tolerated treatment well;No increased pain    Behavior During Therapy WFL for tasks assessed/performed                           Past Medical History:  Diagnosis Date   Asthma    GERD (gastroesophageal reflux disease)    Hyperlipidemia    Hypertension    Obesity    Stroke [facility])    Past Surgical History:  Procedure Laterality Date   BRAIN SURGERY     Patient Active Problem List   Diagnosis Date Noted   Impaired glucose tolerance 03/23/2020   Learning disability 03/23/2020   Urinary and fecal incontinence 06/14/2019   Chronic pain syndrome 03/23/2019   Constipation 03/23/2019   Dysarthria 03/23/2019   Dysphagia 03/23/2019   Learning difficulty 03/23/2019   Morbid obesity (HCC) 03/23/2019   Muscle weakness 03/23/2019   Vitamin D deficiency 03/23/2019   Bilateral carotid artery stenosis 12/15/2018   Moderate aortic valve stenosis 12/15/2018   BMI 45.0-49.9, adult (HCC) 11/10/2018   Cerebrovascular accident (HCC) 05/02/2016   Hemiparesis affecting right side as late effect of cerebrovascular accident (HCC) 05/01/2016   Brain tumor (HCC) 11/24/2014   Gastro-esophageal reflux disease without esophagitis 11/24/2014   Accident due to mechanical fall without injury 08/03/2014   Essential hypertension 06/13/2014   Mixed hyperlipidemia 06/13/2014   Unspecified sequelae of cerebral infarction 06/13/2014   Thyromegaly  01/07/2014   Type 2 diabetes mellitus without complication (HCC) 01/06/2014   Neck mass 09/08/2013   Swimmer's ear 09/08/2013   Erectile dysfunction 01/03/2013   Prediabetes 01/03/2013   ONSET DATE: 05/09/2016  REFERRING DIAG: O96.295 (ICD-10-CM) - Hemiplegia and hemiparesis following cerebral infarction affecting right dominant side   THERAPY DIAG:  Muscle weakness (generalized)  Unsteadiness on feet  Rationale for Evaluation and Treatment Rehabilitation  SUBJECTIVE:  SUBJECTIVE STATEMENT  Pt reports no pain, or recent stumbles/falls. His feet/toes have been feeling better since toenails trimmed at doctor's appointment.   Pt accompanied by: self/  PERTINENT HISTORY:  The pt is a pleasant 57 yo male who returns to PT for evaluation of deficits s/o old CVA. Pt with hx of CVA Feb 2018. He was previously fully independent, but CVA caused significant deficits, mainly affecting his right side. Pt reports continued difficulty with walking and transfers. Pt still using WC for majority of mobility, otherwise he uses his RW. Pt lives with his mom who is his main caregiver. Patient had outpatient PT previously in clinic in 2023. He reports since discharge he has been completing his HEP and walking about every day. Pt would like to improve his walking mainly, but also would like to work on balance. He also reports difficulty with transfers. Pt's mom and his caregiver helps him with ADLs. However, he says he is now waiting for a new caregiver. Prior to this pt had caregiver 3x/week. PMH significant for hx of brain tumor, past CVA, DMII and HTN. See chart for additional information.  PAIN:  Are you having pain? Pt reports no pain currently  PRECAUTIONS: fall  WEIGHT BEARING RESTRICTIONS none  PLOF: required  assistance with ADLs (old CVA), prior to CVA in 2018 was indep.  History of Falls: Pt reports no recent falls  Home information: Pt lives with mother who is his primary caregiver. Previously had home aid, 3x/week but reports currently waiting for new one.  PATIENT GOALS: Pt would like to improve his ability to ambulate.  OBJECTIVE:  taken at [facility] unless otherwise noted:   DIAGNOSTIC FINDINGS: No recent imaging per chart  Objective measures taken at eval unless otherwise specified:   Posture: forward head posture, rounded shoulders  MMT LE: grossly  4+/5, exception hip flexors and abductors 4/5 each bilat   Functional tests:  5xSTS:  34 sec, use of BUE (multiple initial attempts with posterior LOB in chair)   : 0.096 m/s with RW, close CGA, and WC follow  Gait for distance (goal 150 ft): deferred due to time  Gait mechanics: impaired, decreased speed (see ), crouched posture, poor postural stability and relies heavily on BUE support on RW. Pt now with bilat AFOS   FOTO: 44 (goal 50)    TODAY'S TREATMENT  12/31/22  THEREX:   Seated hip march 2 x12 reps each LE. Rates medium  Seated knee ext 2 x 12 reps each LE (continued VC for full ROM as possible)  STS 4x WC hamstring curl x 20 ft  Postural exercises seated: Seated PVC overhead raise 3x10 bilat UE Seated YTB horizontal abduction 3x12 Rates medium Matrix cable machine rows 2.5# 2x10 bilat UE  Therapeutic Activities:  Static stand // bars with UE support multiple x 30 sec  bouts cuing for technique  Static standing in // bars without  UE 30 sec bouts x 4 reps. PT provides up to min assist to correct for LOB     PATIENT EDUCATION: Education details: Pt educated throughout session about proper posture and technique with exercises. Improved exercise technique, movement at target joints, use of target muscles after min to mod verbal, visual, tactile cues.   Person educated: Patient Education method:  Explanation, Demonstration, Tactile cues, and Verbal cues Education comprehension: verbalized understanding, returned demonstration, and verbal cues required  HOME EXERCISE PROGRAM:  Reviewed HEP in session but unable to issue printout today due to printer error -  will provide printout next visit  Access Code: [email or web address] URL: [email or web address] Date: 10/01/2022 Prepared by: Temple Pacini  Exercises - Seated March  - 1 x daily - 6 x weekly - 3 sets - 10 reps - Seated Hip Abduction  - 1 x daily - 6 x weekly - 2 sets - 10 reps  GOALS: Goals reviewed with patient? No  SHORT TERM GOALS: Target date: 11/12/2022  Pt will be independent with HEP in order to improve strength and balance in order to decrease fall risk and improve function at home. Baseline: instructed in HEP today, to provide printout next visit Goal status: MET    LONG TERM GOALS: Target date: 03/18/2023  Pt will increase FOTO to at least 50 in order to demonstrate improvement in function related to mobility and QoL. Baseline: 44 7/29:48 9/10:40 12/24/2022= 51 Goal status: MET  2.  Pt (< 60 yrs old) will complete 5xSTS test < 30 seconds indicating an increase in LE strength and improved balance. Baseline: 10/01/22 34 sec (multiple initial attempts with posterior LOB in chair) 12/20: 49 seconds 11/04/22:1:06, min A on multiple attempts to maintain balance and pt stays in flexed posture throughout test when coming to standing.   9/10: 27.5 sec, Pushes WC post consistently, close CGA  Goal status: ONGOING  3.  Pt will increase to > 0.5 m/s (30 sec) as to improve gait speed for better home ambulation and reduce fall risk. Baseline: 10/01/22 0.096 m/s with RW, close CGA, and WC follow previously 12/27: 0.11 m/s 11/04/22: .056 m/s  9/10: 076 m/s Goal status: ONGOING   4.  Pt will increase hip flexor and abductor  gross strength to 4+/5 bilat as to improve functional strength for independent gait, increased  standing tolerance, and increased ADL ability. Baseline: 10/01/22 4+/5, exception hip flexors and abductors 4/5 each bilat  (previously 03/27/22: 4+/5 ) 9/10: Hip flexor 4/5, hip abductor 4+/5 Goal status: ONGOING  5.  Pt will ambulate 150 ft with BRW and WC follow with no rest breaks to allow for improved mobility within home and increased independence. Baseline: 10/03/22 150' with BRW 04/03/22: 105 ft with BRW 7/29: 100 in 9:30 with pediatric RW 9/10:101 ft with BRW stooped posture worsened throughout test 12/24/2022- 150 feet using BRW, CGA with w/c follow- in 10 min 17 sec. Will keep goal to make sure he can do this consistently Goal status: ONGOING    ASSESSMENT :  CLINICAL IMPRESSION: PT continued plan of car as laid out in recent sessions. Pt generally required min assist with standing activity in // bars when attempting without UE support. He was able to improve posture with UE support with cuing, but consistently exhibited posterior LOB without FWD lean/compensation when unassisted. He tolerated interventions well without any pain and without significant fatigue.The pt will continue to benefit from skilled physical therapy intervention to address impairments, improve QOL, and attain therapy goals.    OBJECTIVE IMPAIRMENTS Abnormal gait, decreased activity tolerance, decreased balance, decreased coordination, decreased endurance, decreased mobility, difficulty walking, decreased ROM, decreased strength, improper body mechanics, postural dysfunction, and obesity.   ACTIVITY LIMITATIONS carrying, lifting, bending, sitting, standing, squatting, stairs, transfers, bed mobility, continence, bathing, toileting, dressing, hygiene/grooming, and locomotion level  PARTICIPATION LIMITATIONS: meal prep, cleaning, laundry, interpersonal relationship, driving, shopping, community activity, and yard work  PERSONAL FACTORS Fitness, Past/current experiences, Time since onset of  injury/illness/exacerbation, and 3+ comorbidities: hx of brain tumor, past CVA, HTN, obesity, learning disability, DMII  are also affecting patient's functional  outcome.   REHAB POTENTIAL: Fair    CLINICAL DECISION MAKING: Stable/uncomplicated  EVALUATION COMPLEXITY: Low  PLAN: PT FREQUENCY: 1-2x/week  PT DURATION: 12 weeks  PLANNED INTERVENTIONS: Therapeutic exercises, Therapeutic activity, Neuromuscular re-education, Balance training, Gait training, Patient/Family education, Joint mobilization, Stair training, Vestibular training, Orthotic/Fit training, DME instructions, Dry Needling, Electrical stimulation, Wheelchair mobility training, Cryotherapy, Moist heat, Compression bandaging, Manual therapy, and Re-evaluation  PLAN FOR NEXT SESSION:  As much as possible / is tolerated have patient stand with good posture during his rest breaks. continue with strengthening exercises of the LE's,  functional ambulation when appropriate.     Baird Kay PT Physical Therapist- [facility]  [facility]   11:43 AM 12/31/22

## 2023-01-02 ENCOUNTER — Ambulatory Visit: Payer: 59

## 2023-01-02 DIAGNOSIS — M6281 Muscle weakness (generalized): Secondary | ICD-10-CM | POA: Diagnosis not present

## 2023-01-02 DIAGNOSIS — R531 Weakness: Secondary | ICD-10-CM

## 2023-01-02 DIAGNOSIS — R262 Difficulty in walking, not elsewhere classified: Secondary | ICD-10-CM

## 2023-01-02 DIAGNOSIS — R269 Unspecified abnormalities of gait and mobility: Secondary | ICD-10-CM

## 2023-01-02 DIAGNOSIS — R278 Other lack of coordination: Secondary | ICD-10-CM

## 2023-01-02 DIAGNOSIS — R2689 Other abnormalities of gait and mobility: Secondary | ICD-10-CM

## 2023-01-02 DIAGNOSIS — R2681 Unsteadiness on feet: Secondary | ICD-10-CM

## 2023-01-02 DIAGNOSIS — R296 Repeated falls: Secondary | ICD-10-CM

## 2023-01-02 NOTE — Therapy (Signed)
OUTPATIENT PHYSICAL THERAPY NEURO TREATMENT      Patient Name: Curtis Powell MRN: [id number] DOB:1965/08/13, 57 y.o., male Today's Date: 01/02/2023  PCP: Enid Baas, MD  REFERRING PROVIDER: Enid Baas, MD    PT End of Session - 01/02/23 1011     Visit Number 25    Number of Visits 46    Date for PT Re-Evaluation 03/18/23    Progress Note Due on Visit 30    PT Start Time 1012    PT Stop Time 1059    PT Time Calculation (min) 47 min    Equipment Utilized During Treatment Gait belt    Activity Tolerance Patient tolerated treatment well;No increased pain    Behavior During Therapy WFL for tasks assessed/performed                            Past Medical History:  Diagnosis Date   Asthma    GERD (gastroesophageal reflux disease)    Hyperlipidemia    Hypertension    Obesity    Stroke [facility])    Past Surgical History:  Procedure Laterality Date   BRAIN SURGERY     Patient Active Problem List   Diagnosis Date Noted   Impaired glucose tolerance 03/23/2020   Learning disability 03/23/2020   Urinary and fecal incontinence 06/14/2019   Chronic pain syndrome 03/23/2019   Constipation 03/23/2019   Dysarthria 03/23/2019   Dysphagia 03/23/2019   Learning difficulty 03/23/2019   Morbid obesity (HCC) 03/23/2019   Muscle weakness 03/23/2019   Vitamin D deficiency 03/23/2019   Bilateral carotid artery stenosis 12/15/2018   Moderate aortic valve stenosis 12/15/2018   BMI 45.0-49.9, adult (HCC) 11/10/2018   Cerebrovascular accident (HCC) 05/02/2016   Hemiparesis affecting right side as late effect of cerebrovascular accident (HCC) 05/01/2016   Brain tumor (HCC) 11/24/2014   Gastro-esophageal reflux disease without esophagitis 11/24/2014   Accident due to mechanical fall without injury 08/03/2014   Essential hypertension 06/13/2014   Mixed hyperlipidemia 06/13/2014   Unspecified sequelae of cerebral infarction 06/13/2014   Thyromegaly  01/07/2014   Type 2 diabetes mellitus without complication (HCC) 01/06/2014   Neck mass 09/08/2013   Swimmer's ear 09/08/2013   Erectile dysfunction 01/03/2013   Prediabetes 01/03/2013   ONSET DATE: 05/09/2016  REFERRING DIAG: G64.403 (ICD-10-CM) - Hemiplegia and hemiparesis following cerebral infarction affecting right dominant side   THERAPY DIAG:  Muscle weakness (generalized)  Unsteadiness on feet  Difficulty in walking, not elsewhere classified  Other abnormalities of gait and mobility  Abnormality of gait and mobility  Other lack of coordination  Left-sided weakness  Repeated falls  Rationale for Evaluation and Treatment Rehabilitation  SUBJECTIVE:  SUBJECTIVE STATEMENT  Patient reports no new news- just been watching TV and exercising. Denies any falls and no pain today.    Pt accompanied by: self/  PERTINENT HISTORY:  The pt is a pleasant 57 yo male who returns to PT for evaluation of deficits s/o old CVA. Pt with hx of CVA Feb 2018. He was previously fully independent, but CVA caused significant deficits, mainly affecting his right side. Pt reports continued difficulty with walking and transfers. Pt still using WC for majority of mobility, otherwise he uses his RW. Pt lives with his mom who is his main caregiver. Patient had outpatient PT previously in clinic in 2023. He reports since discharge he has been completing his HEP and walking about every day. Pt would like to improve his walking mainly, but also would like to work on balance. He also reports difficulty with transfers. Pt's mom and his caregiver helps him with ADLs. However, he says he is now waiting for a new caregiver. Prior to this pt had caregiver 3x/week. PMH significant for hx of brain tumor, past CVA, DMII and HTN.  See chart for additional information.  PAIN:  Are you having pain? Pt reports no pain currently  PRECAUTIONS: fall  WEIGHT BEARING RESTRICTIONS none  PLOF: required assistance with ADLs (old CVA), prior to CVA in 2018 was indep.  History of Falls: Pt reports no recent falls  Home information: Pt lives with mother who is his primary caregiver. Previously had home aid, 3x/week but reports currently waiting for new one.  PATIENT GOALS: Pt would like to improve his ability to ambulate.  OBJECTIVE:  taken at [facility] unless otherwise noted:   DIAGNOSTIC FINDINGS: No recent imaging per chart  Objective measures taken at eval unless otherwise specified:   Posture: forward head posture, rounded shoulders  MMT LE: grossly  4+/5, exception hip flexors and abductors 4/5 each bilat   Functional tests:  5xSTS:  34 sec, use of BUE (multiple initial attempts with posterior LOB in chair)   : 0.096 m/s with RW, close CGA, and WC follow  Gait for distance (goal 150 ft): deferred due to time  Gait mechanics: impaired, decreased speed (see ), crouched posture, poor postural stability and relies heavily on BUE support on RW. Pt now with bilat AFOS   FOTO: 44 (goal 50)    TODAY'S TREATMENT  01/02/23  THEREX:   Standing hip march 2 x10 reps each LE with BUE Support at // bars-  Rates medium  Side stepping in // bars -down and back with BUE support - rates as medium-hard Forward walk then retro walk in // bars (counting steps- 18 steps forward from one end of bars to other and 30 steps backward)  x 2 trials.  Seated knee ext 2 x 12 reps each LE (continued VC for full ROM as possible)  STS 5x -    Postural exercises:  Standing Matrix cable machine rows 2.5# x10 bilat UE- very difficult and mod A to maintain balance- switches to single arm rows and other hand holding onto walker handle x 12 reps each arm. Standing lumbar flex into ext- 2 sets of 10 reps   Therapeutic Activities:   Static stand // bars without UE support x 30 sec x 3  bouts cuing for technique and intermittent min assist to correct for LOB     PATIENT EDUCATION: Education details: Pt educated throughout session about proper posture and technique with exercises. Improved exercise technique, movement at target joints, use  of target muscles after min to mod verbal, visual, tactile cues.   Person educated: Patient Education method: Explanation, Demonstration, Tactile cues, and Verbal cues Education comprehension: verbalized understanding, returned demonstration, and verbal cues required  HOME EXERCISE PROGRAM:  Reviewed HEP in session but unable to issue printout today due to printer error - will provide printout next visit  Access Code: [email or web address] URL: [email or web address] Date: 10/01/2022 Prepared by: Temple Pacini  Exercises - Seated March  - 1 x daily - 6 x weekly - 3 sets - 10 reps - Seated Hip Abduction  - 1 x daily - 6 x weekly - 2 sets - 10 reps  GOALS: Goals reviewed with patient? No  SHORT TERM GOALS: Target date: 11/12/2022  Pt will be independent with HEP in order to improve strength and balance in order to decrease fall risk and improve function at home. Baseline: instructed in HEP today, to provide printout next visit Goal status: MET    LONG TERM GOALS: Target date: 03/18/2023  Pt will increase FOTO to at least 50 in order to demonstrate improvement in function related to mobility and QoL. Baseline: 44 7/29:48 9/10:40 12/24/2022= 51 Goal status: MET  2.  Pt (< 60 yrs old) will complete 5xSTS test < 30 seconds indicating an increase in LE strength and improved balance. Baseline: 10/01/22 34 sec (multiple initial attempts with posterior LOB in chair) 12/20: 49 seconds 11/04/22:1:06, min A on multiple attempts to maintain balance and pt stays in flexed posture throughout test when coming to standing.   9/10: 27.5 sec, Pushes WC post consistently, close CGA  Goal  status: ONGOING  3.  Pt will increase to > 0.5 m/s (30 sec) as to improve gait speed for better home ambulation and reduce fall risk. Baseline: 10/01/22 0.096 m/s with RW, close CGA, and WC follow previously 12/27: 0.11 m/s 11/04/22: .056 m/s  9/10: 076 m/s Goal status: ONGOING   4.  Pt will increase hip flexor and abductor  gross strength to 4+/5 bilat as to improve functional strength for independent gait, increased standing tolerance, and increased ADL ability. Baseline: 10/01/22 4+/5, exception hip flexors and abductors 4/5 each bilat  (previously 03/27/22: 4+/5 ) 9/10: Hip flexor 4/5, hip abductor 4+/5 Goal status: ONGOING  5.  Pt will ambulate 150 ft with BRW and WC follow with no rest breaks to allow for improved mobility within home and increased independence. Baseline: 10/03/22 150' with BRW 04/03/22: 105 ft with BRW 7/29: 100 in 9:30 with pediatric RW 9/10:101 ft with BRW stooped posture worsened throughout test 12/24/2022- 150 feet using BRW, CGA with w/c follow- in 10 min 17 sec. Will keep goal to make sure he can do this consistently Goal status: ONGOING    ASSESSMENT :  CLINICAL IMPRESSION: Continued per plan of care with focus on standing activities. Patient responded well overall with increased VC to stand erect in // bars and with matrix cable system.  He struggle with taking larger steps yet was able to increase his standing time overall today. The pt will continue to benefit from skilled physical therapy intervention to address impairments, improve QOL, and attain therapy goals.    OBJECTIVE IMPAIRMENTS Abnormal gait, decreased activity tolerance, decreased balance, decreased coordination, decreased endurance, decreased mobility, difficulty walking, decreased ROM, decreased strength, improper body mechanics, postural dysfunction, and obesity.   ACTIVITY LIMITATIONS carrying, lifting, bending, sitting, standing, squatting, stairs, transfers, bed mobility, continence,  bathing, toileting, dressing, hygiene/grooming, and locomotion level  PARTICIPATION LIMITATIONS: meal prep,  cleaning, laundry, interpersonal relationship, driving, shopping, community activity, and yard work  PERSONAL FACTORS Fitness, Past/current experiences, Time since onset of injury/illness/exacerbation, and 3+ comorbidities: hx of brain tumor, past CVA, HTN, obesity, learning disability, DMII  are also affecting patient's functional outcome.   REHAB POTENTIAL: Fair    CLINICAL DECISION MAKING: Stable/uncomplicated  EVALUATION COMPLEXITY: Low  PLAN: PT FREQUENCY: 1-2x/week  PT DURATION: 12 weeks  PLANNED INTERVENTIONS: Therapeutic exercises, Therapeutic activity, Neuromuscular re-education, Balance training, Gait training, Patient/Family education, Joint mobilization, Stair training, Vestibular training, Orthotic/Fit training, DME instructions, Dry Needling, Electrical stimulation, Wheelchair mobility training, Cryotherapy, Moist heat, Compression bandaging, Manual therapy, and Re-evaluation  PLAN FOR NEXT SESSION:  As much as possible / is tolerated have patient stand with good posture during his rest breaks. continue with strengthening exercises of the LE's,  functional ambulation when appropriate.     Lenda Kelp PT Physical Therapist- [place]  [facility]   1:11 PM 01/02/23

## 2023-01-07 ENCOUNTER — Ambulatory Visit: Payer: 59 | Attending: Internal Medicine | Admitting: Physical Therapy

## 2023-01-07 DIAGNOSIS — R269 Unspecified abnormalities of gait and mobility: Secondary | ICD-10-CM | POA: Diagnosis present

## 2023-01-07 DIAGNOSIS — R2681 Unsteadiness on feet: Secondary | ICD-10-CM | POA: Diagnosis present

## 2023-01-07 DIAGNOSIS — R262 Difficulty in walking, not elsewhere classified: Secondary | ICD-10-CM | POA: Diagnosis present

## 2023-01-07 DIAGNOSIS — M6281 Muscle weakness (generalized): Secondary | ICD-10-CM | POA: Diagnosis present

## 2023-01-07 DIAGNOSIS — R2689 Other abnormalities of gait and mobility: Secondary | ICD-10-CM | POA: Insufficient documentation

## 2023-01-07 NOTE — Therapy (Signed)
OUTPATIENT PHYSICAL THERAPY NEURO TREATMENT      Patient Name: Curtis Powell MRN: [id number] DOB:05/24/1965, 57 y.o., male Today's Date: 01/07/2023  PCP: Enid Baas, MD  REFERRING PROVIDER: Enid Baas, MD    PT End of Session - 01/07/23 1108     Visit Number 26    Number of Visits 46    Date for PT Re-Evaluation 03/18/23    Progress Note Due on Visit 30    PT Start Time 1115    PT Stop Time 1200    PT Time Calculation (min) 45 min    Equipment Utilized During Treatment Gait belt    Activity Tolerance Patient tolerated treatment well;No increased pain    Behavior During Therapy WFL for tasks assessed/performed                            Past Medical History:  Diagnosis Date   Asthma    GERD (gastroesophageal reflux disease)    Hyperlipidemia    Hypertension    Obesity    Stroke [facility])    Past Surgical History:  Procedure Laterality Date   BRAIN SURGERY     Patient Active Problem List   Diagnosis Date Noted   Impaired glucose tolerance 03/23/2020   Learning disability 03/23/2020   Urinary and fecal incontinence 06/14/2019   Chronic pain syndrome 03/23/2019   Constipation 03/23/2019   Dysarthria 03/23/2019   Dysphagia 03/23/2019   Learning difficulty 03/23/2019   Morbid obesity (HCC) 03/23/2019   Muscle weakness 03/23/2019   Vitamin D deficiency 03/23/2019   Bilateral carotid artery stenosis 12/15/2018   Moderate aortic valve stenosis 12/15/2018   BMI 45.0-49.9, adult (HCC) 11/10/2018   Cerebrovascular accident (HCC) 05/02/2016   Hemiparesis affecting right side as late effect of cerebrovascular accident (HCC) 05/01/2016   Brain tumor (HCC) 11/24/2014   Gastro-esophageal reflux disease without esophagitis 11/24/2014   Accident due to mechanical fall without injury 08/03/2014   Essential hypertension 06/13/2014   Mixed hyperlipidemia 06/13/2014   Unspecified sequelae of cerebral infarction 06/13/2014   Thyromegaly  01/07/2014   Type 2 diabetes mellitus without complication (HCC) 01/06/2014   Neck mass 09/08/2013   Swimmer's ear 09/08/2013   Erectile dysfunction 01/03/2013   Prediabetes 01/03/2013   ONSET DATE: 05/09/2016  REFERRING DIAG: J19.147 (ICD-10-CM) - Hemiplegia and hemiparesis following cerebral infarction affecting right dominant side   THERAPY DIAG:  Unsteadiness on feet  Difficulty in walking, not elsewhere classified  Other abnormalities of gait and mobility  Abnormality of gait and mobility  Rationale for Evaluation and Treatment Rehabilitation  SUBJECTIVE:  SUBJECTIVE STATEMENT  Pt reports doing well today. Pt denies any recent falls/stumbles since prior session. Pt denies any updates to medications or medical appointment since prior session. Pt reports good compliance with HEP when time permits.    Pt accompanied by: self/  PERTINENT HISTORY:  The pt is a pleasant 57 yo male who returns to PT for evaluation of deficits s/o old CVA. Pt with hx of CVA Feb 2018. He was previously fully independent, but CVA caused significant deficits, mainly affecting his right side. Pt reports continued difficulty with walking and transfers. Pt still using WC for majority of mobility, otherwise he uses his RW. Pt lives with his mom who is his main caregiver. Patient had outpatient PT previously in clinic in 2023. He reports since discharge he has been completing his HEP and walking about every day. Pt would like to improve his walking mainly, but also would like to work on balance. He also reports difficulty with transfers. Pt's mom and his caregiver helps him with ADLs. However, he says he is now waiting for a new caregiver. Prior to this pt had caregiver 3x/week. PMH significant for hx of brain tumor, past CVA,  DMII and HTN. See chart for additional information.  PAIN:  Are you having pain? Pt reports no pain currently  PRECAUTIONS: fall  WEIGHT BEARING RESTRICTIONS none  PLOF: required assistance with ADLs (old CVA), prior to CVA in 2018 was indep.  History of Falls: Pt reports no recent falls  Home information: Pt lives with mother who is his primary caregiver. Previously had home aid, 3x/week but reports currently waiting for new one.  PATIENT GOALS: Pt would like to improve his ability to ambulate.  OBJECTIVE:  taken at [facility] unless otherwise noted:   DIAGNOSTIC FINDINGS: No recent imaging per chart  Objective measures taken at eval unless otherwise specified:   Posture: forward head posture, rounded shoulders  MMT LE: grossly  4+/5, exception hip flexors and abductors 4/5 each bilat   Functional tests:  5xSTS:  34 sec, use of BUE (multiple initial attempts with posterior LOB in chair)   : 0.096 m/s with RW, close CGA, and WC follow  Gait for distance (goal 150 ft): deferred due to time  Gait mechanics: impaired, decreased speed (see ), crouched posture, poor postural stability and relies heavily on BUE support on RW. Pt now with bilat AFOS   FOTO: 44 (goal 50)    TODAY'S TREATMENT  01/07/23  Theract:   STS, then focus on postural extension throughout the below exercise standing in walker.  - marching x 10 ea LE, cues to prevent sitting down in chair  - standing weight shift to R and L by releasing hands on walker 10 ea   Seated therapeutic rest break per pt need  STS then 2 x 10 ea UE overhead reach for challenging weight shift and decreased support of UE  TE Seated knee ext with 3# AW x 20 reps ea LE  Seated march 20 x ea with 3# AW   Therapeutic Activities:  Static stand // bars for 13 total minutes while doing the below - ambulation with focus on keeping forward momentum x 2 laps with 1 min standing rest with postural focus between  reps -sidestepping x 2 laps with the same above rest up to 13 minutes of standing then transitioned to seated rest.      PATIENT EDUCATION: Education details: Pt educated throughout session about proper posture and technique with exercises. Improved exercise  technique, movement at target joints, use of target muscles after min to mod verbal, visual, tactile cues.   Person educated: Patient Education method: Explanation, Demonstration, Tactile cues, and Verbal cues Education comprehension: verbalized understanding, returned demonstration, and verbal cues required  HOME EXERCISE PROGRAM:  Reviewed HEP in session but unable to issue printout today due to printer error - will provide printout next visit  Access Code: [email or web address] URL: [email or web address] Date: 10/01/2022 Prepared by: Temple Pacini  Exercises - Seated March  - 1 x daily - 6 x weekly - 3 sets - 10 reps - Seated Hip Abduction  - 1 x daily - 6 x weekly - 2 sets - 10 reps  GOALS: Goals reviewed with patient? No  SHORT TERM GOALS: Target date: 11/12/2022  Pt will be independent with HEP in order to improve strength and balance in order to decrease fall risk and improve function at home. Baseline: instructed in HEP today, to provide printout next visit Goal status: MET    LONG TERM GOALS: Target date: 03/18/2023  Pt will increase FOTO to at least 50 in order to demonstrate improvement in function related to mobility and QoL. Baseline: 44 7/29:48 9/10:40 12/24/2022= 51 Goal status: MET  2.  Pt (< 60 yrs old) will complete 5xSTS test < 30 seconds indicating an increase in LE strength and improved balance. Baseline: 10/01/22 34 sec (multiple initial attempts with posterior LOB in chair) 12/20: 49 seconds 11/04/22:1:06, min A on multiple attempts to maintain balance and pt stays in flexed posture throughout test when coming to standing.   9/10: 27.5 sec, Pushes WC post consistently, close CGA  Goal status:  ONGOING  3.  Pt will increase to > 0.5 m/s (30 sec) as to improve gait speed for better home ambulation and reduce fall risk. Baseline: 10/01/22 0.096 m/s with RW, close CGA, and WC follow previously 12/27: 0.11 m/s 11/04/22: .056 m/s  9/10: 076 m/s Goal status: ONGOING   4.  Pt will increase hip flexor and abductor  gross strength to 4+/5 bilat as to improve functional strength for independent gait, increased standing tolerance, and increased ADL ability. Baseline: 10/01/22 4+/5, exception hip flexors and abductors 4/5 each bilat  (previously 03/27/22: 4+/5 ) 9/10: Hip flexor 4/5, hip abductor 4+/5 Goal status: ONGOING  5.  Pt will ambulate 150 ft with BRW and WC follow with no rest breaks to allow for improved mobility within home and increased independence. Baseline: 10/03/22 150' with BRW 04/03/22: 105 ft with BRW 7/29: 100 in 9:30 with pediatric RW 9/10:101 ft with BRW stooped posture worsened throughout test 12/24/2022- 150 feet using BRW, CGA with w/c follow- in 10 min 17 sec. Will keep goal to make sure he can do this consistently Goal status: ONGOING    ASSESSMENT :  CLINICAL IMPRESSION:  Continued per plan of care with focus on standing activities.  Patient continues with exercises to improve his posture as well as to improve his postural endurance and ambulatory efficiency and capacity.  Patient showed good progress with ambulation in parallel bars showing good for momentum throughout without having posterior weight shift when stepping onto each leg.  Patient able to stand for approximately 13 minutes today without seated rest break but was focusing on posture and had upper extremity support throughout. Pt will continue to benefit from skilled physical therapy intervention to address impairments, improve QOL, and attain therapy goals.    OBJECTIVE IMPAIRMENTS Abnormal gait, decreased activity tolerance, decreased balance, decreased coordination, decreased endurance,  decreased  mobility, difficulty walking, decreased ROM, decreased strength, improper body mechanics, postural dysfunction, and obesity.   ACTIVITY LIMITATIONS carrying, lifting, bending, sitting, standing, squatting, stairs, transfers, bed mobility, continence, bathing, toileting, dressing, hygiene/grooming, and locomotion level  PARTICIPATION LIMITATIONS: meal prep, cleaning, laundry, interpersonal relationship, driving, shopping, community activity, and yard work  PERSONAL FACTORS Fitness, Past/current experiences, Time since onset of injury/illness/exacerbation, and 3+ comorbidities: hx of brain tumor, past CVA, HTN, obesity, learning disability, DMII  are also affecting patient's functional outcome.   REHAB POTENTIAL: Fair    CLINICAL DECISION MAKING: Stable/uncomplicated  EVALUATION COMPLEXITY: Low  PLAN: PT FREQUENCY: 1-2x/week  PT DURATION: 12 weeks  PLANNED INTERVENTIONS: Therapeutic exercises, Therapeutic activity, Neuromuscular re-education, Balance training, Gait training, Patient/Family education, Joint mobilization, Stair training, Vestibular training, Orthotic/Fit training, DME instructions, Dry Needling, Electrical stimulation, Wheelchair mobility training, Cryotherapy, Moist heat, Compression bandaging, Manual therapy, and Re-evaluation  PLAN FOR NEXT SESSION:  As much as possible / is tolerated have patient stand with good posture during his rest breaks. continue with strengthening exercises of the LE's,  functional ambulation when appropriate.     Norman Herrlich PT Physical Therapist- [facility]   11:09 AM 01/07/23

## 2023-01-09 ENCOUNTER — Ambulatory Visit: Payer: 59 | Admitting: Physical Therapy

## 2023-01-09 DIAGNOSIS — R2681 Unsteadiness on feet: Secondary | ICD-10-CM | POA: Diagnosis not present

## 2023-01-09 DIAGNOSIS — R269 Unspecified abnormalities of gait and mobility: Secondary | ICD-10-CM

## 2023-01-09 DIAGNOSIS — M6281 Muscle weakness (generalized): Secondary | ICD-10-CM

## 2023-01-09 DIAGNOSIS — R262 Difficulty in walking, not elsewhere classified: Secondary | ICD-10-CM

## 2023-01-09 DIAGNOSIS — R2689 Other abnormalities of gait and mobility: Secondary | ICD-10-CM

## 2023-01-09 NOTE — Therapy (Signed)
OUTPATIENT PHYSICAL THERAPY NEURO TREATMENT      Patient Name: Curtis Powell MRN: [id number] DOB:11/04/1965, 57 y.o., male Today's Date: 01/09/2023  PCP: Enid Baas, MD  REFERRING PROVIDER: Enid Baas, MD    PT End of Session - 01/09/23 1230     Visit Number 27    Number of Visits 46    Date for PT Re-Evaluation 03/18/23    Progress Note Due on Visit 30    PT Start Time 1147    PT Stop Time 1227    PT Time Calculation (min) 40 min    Equipment Utilized During Treatment Gait belt    Activity Tolerance Patient tolerated treatment well;No increased pain    Behavior During Therapy WFL for tasks assessed/performed                             Past Medical History:  Diagnosis Date   Asthma    GERD (gastroesophageal reflux disease)    Hyperlipidemia    Hypertension    Obesity    Stroke [facility])    Past Surgical History:  Procedure Laterality Date   BRAIN SURGERY     Patient Active Problem List   Diagnosis Date Noted   Impaired glucose tolerance 03/23/2020   Learning disability 03/23/2020   Urinary and fecal incontinence 06/14/2019   Chronic pain syndrome 03/23/2019   Constipation 03/23/2019   Dysarthria 03/23/2019   Dysphagia 03/23/2019   Learning difficulty 03/23/2019   Morbid obesity (HCC) 03/23/2019   Muscle weakness 03/23/2019   Vitamin D deficiency 03/23/2019   Bilateral carotid artery stenosis 12/15/2018   Moderate aortic valve stenosis 12/15/2018   BMI 45.0-49.9, adult (HCC) 11/10/2018   Cerebrovascular accident (HCC) 05/02/2016   Hemiparesis affecting right side as late effect of cerebrovascular accident (HCC) 05/01/2016   Brain tumor (HCC) 11/24/2014   Gastro-esophageal reflux disease without esophagitis 11/24/2014   Accident due to mechanical fall without injury 08/03/2014   Essential hypertension 06/13/2014   Mixed hyperlipidemia 06/13/2014   Unspecified sequelae of cerebral infarction 06/13/2014   Thyromegaly  01/07/2014   Type 2 diabetes mellitus without complication (HCC) 01/06/2014   Neck mass 09/08/2013   Swimmer's ear 09/08/2013   Erectile dysfunction 01/03/2013   Prediabetes 01/03/2013   ONSET DATE: 05/09/2016  REFERRING DIAG: N82.956 (ICD-10-CM) - Hemiplegia and hemiparesis following cerebral infarction affecting right dominant side   THERAPY DIAG:  Unsteadiness on feet  Difficulty in walking, not elsewhere classified  Other abnormalities of gait and mobility  Abnormality of gait and mobility  Muscle weakness (generalized)  Rationale for Evaluation and Treatment Rehabilitation  SUBJECTIVE:  SUBJECTIVE STATEMENT  Pt reports doing well today. Pt denies any recent falls/stumbles since prior session. Pt denies any updates to medications or medical appointment since prior session. Pt reports good compliance with HEP when time permits.    Pt accompanied by: self/  PERTINENT HISTORY:  The pt is a pleasant 57 yo male who returns to PT for evaluation of deficits s/o old CVA. Pt with hx of CVA Feb 2018. He was previously fully independent, but CVA caused significant deficits, mainly affecting his right side. Pt reports continued difficulty with walking and transfers. Pt still using WC for majority of mobility, otherwise he uses his RW. Pt lives with his mom who is his main caregiver. Patient had outpatient PT previously in clinic in 2023. He reports since discharge he has been completing his HEP and walking about every day. Pt would like to improve his walking mainly, but also would like to work on balance. He also reports difficulty with transfers. Pt's mom and his caregiver helps him with ADLs. However, he says he is now waiting for a new caregiver. Prior to this pt had caregiver 3x/week. PMH significant  for hx of brain tumor, past CVA, DMII and HTN. See chart for additional information.  PAIN:  Are you having pain? Pt reports no pain currently  PRECAUTIONS: fall  WEIGHT BEARING RESTRICTIONS none  PLOF: required assistance with ADLs (old CVA), prior to CVA in 2018 was indep.  History of Falls: Pt reports no recent falls  Home information: Pt lives with mother who is his primary caregiver. Previously had home aid, 3x/week but reports currently waiting for new one.  PATIENT GOALS: Pt would like to improve his ability to ambulate.  OBJECTIVE:  taken at [facility] unless otherwise noted:   DIAGNOSTIC FINDINGS: No recent imaging per chart  Objective measures taken at eval unless otherwise specified:   Posture: forward head posture, rounded shoulders  MMT LE: grossly  4+/5, exception hip flexors and abductors 4/5 each bilat   Functional tests:  5xSTS:  34 sec, use of BUE (multiple initial attempts with posterior LOB in chair)   : 0.096 m/s with RW, close CGA, and WC follow  Gait for distance (goal 150 ft): deferred due to time  Gait mechanics: impaired, decreased speed (see ), crouched posture, poor postural stability and relies heavily on BUE support on RW. Pt now with bilat AFOS   FOTO: 44 (goal 50)    TODAY'S TREATMENT  01/09/23   TE Seated knee ext with 5# AW 2*10 reps ea LE  Seated march 20 x ea with 5# AW  Forward / retro rolling in WC for quad, hamstring and hip flexor activation x 2 laps distance of // bars.   Therapeutic Activities:  Static stand // bars for  bouts of 10 minutes with 45-60 sec " standing rest" between each lap in // bars; standing with good posture between exercises -second round only made it to 8:45 due to back fatigue from prolonged extended posture.  -walk with cues for maintaining forward momentum.  - retro walk  - side stepping -turns in // bars ( 180)     PATIENT EDUCATION: Education details: Pt educated throughout session  about proper posture and technique with exercises. Improved exercise technique, movement at target joints, use of target muscles after min to mod verbal, visual, tactile cues.   Person educated: Patient Education method: Explanation, Demonstration, Tactile cues, and Verbal cues Education comprehension: verbalized understanding, returned demonstration, and verbal cues required  HOME  EXERCISE PROGRAM:  Reviewed HEP in session but unable to issue printout today due to printer error - will provide printout next visit  Access Code: [email or web address] URL: [email or web address] Date: 10/01/2022 Prepared by: Temple Pacini  Exercises - Seated March  - 1 x daily - 6 x weekly - 3 sets - 10 reps - Seated Hip Abduction  - 1 x daily - 6 x weekly - 2 sets - 10 reps  GOALS: Goals reviewed with patient? No  SHORT TERM GOALS: Target date: 11/12/2022  Pt will be independent with HEP in order to improve strength and balance in order to decrease fall risk and improve function at home. Baseline: instructed in HEP today, to provide printout next visit Goal status: MET    LONG TERM GOALS: Target date: 03/18/2023  Pt will increase FOTO to at least 50 in order to demonstrate improvement in function related to mobility and QoL. Baseline: 44 7/29:48 9/10:40 12/24/2022= 51 Goal status: MET  2.  Pt (< 60 yrs old) will complete 5xSTS test < 30 seconds indicating an increase in LE strength and improved balance. Baseline: 10/01/22 34 sec (multiple initial attempts with posterior LOB in chair) 12/20: 49 seconds 11/04/22:1:06, min A on multiple attempts to maintain balance and pt stays in flexed posture throughout test when coming to standing.   9/10: 27.5 sec, Pushes WC post consistently, close CGA  Goal status: ONGOING  3.  Pt will increase to > 0.5 m/s (30 sec) as to improve gait speed for better home ambulation and reduce fall risk. Baseline: 10/01/22 0.096 m/s with RW, close CGA, and WC follow  previously 12/27: 0.11 m/s 11/04/22: .056 m/s  9/10: 076 m/s Goal status: ONGOING   4.  Pt will increase hip flexor and abductor  gross strength to 4+/5 bilat as to improve functional strength for independent gait, increased standing tolerance, and increased ADL ability. Baseline: 10/01/22 4+/5, exception hip flexors and abductors 4/5 each bilat  (previously 03/27/22: 4+/5 ) 9/10: Hip flexor 4/5, hip abductor 4+/5 Goal status: ONGOING  5.  Pt will ambulate 150 ft with BRW and WC follow with no rest breaks to allow for improved mobility within home and increased independence. Baseline: 10/03/22 150' with BRW 04/03/22: 105 ft with BRW 7/29: 100 in 9:30 with pediatric RW 9/10:101 ft with BRW stooped posture worsened throughout test 12/24/2022- 150 feet using BRW, CGA with w/c follow- in 10 min 17 sec. Will keep goal to make sure he can do this consistently Goal status: ONGOING    ASSESSMENT :  CLINICAL IMPRESSION:  Continued per plan of care with focus on standing activities.  Patient continues with exercises to improve his posture as well as to improve his postural endurance and ambulatory efficiency and capacity.  Patient showed good progress with ambulation in parallel bars showing good for momentum throughout without having posterior weight shift when stepping onto each leg. Pt did lost some of the quality with his ambulation as he fatigued.  Patient able to stand for 2 bouts of extended duration focusing on posture and had upper extremity support throughout. Pt will continue to benefit from skilled physical therapy intervention to address impairments, improve QOL, and attain therapy goals.    OBJECTIVE IMPAIRMENTS Abnormal gait, decreased activity tolerance, decreased balance, decreased coordination, decreased endurance, decreased mobility, difficulty walking, decreased ROM, decreased strength, improper body mechanics, postural dysfunction, and obesity.   ACTIVITY LIMITATIONS carrying,  lifting, bending, sitting, standing, squatting, stairs, transfers, bed mobility, continence, bathing, toileting, dressing, hygiene/grooming,  and locomotion level  PARTICIPATION LIMITATIONS: meal prep, cleaning, laundry, interpersonal relationship, driving, shopping, community activity, and yard work  PERSONAL FACTORS Fitness, Past/current experiences, Time since onset of injury/illness/exacerbation, and 3+ comorbidities: hx of brain tumor, past CVA, HTN, obesity, learning disability, DMII  are also affecting patient's functional outcome.   REHAB POTENTIAL: Fair    CLINICAL DECISION MAKING: Stable/uncomplicated  EVALUATION COMPLEXITY: Low  PLAN: PT FREQUENCY: 1-2x/week  PT DURATION: 12 weeks  PLANNED INTERVENTIONS: Therapeutic exercises, Therapeutic activity, Neuromuscular re-education, Balance training, Gait training, Patient/Family education, Joint mobilization, Stair training, Vestibular training, Orthotic/Fit training, DME instructions, Dry Needling, Electrical stimulation, Wheelchair mobility training, Cryotherapy, Moist heat, Compression bandaging, Manual therapy, and Re-evaluation  PLAN FOR NEXT SESSION:  As much as possible / is tolerated have patient stand with good posture during his rest breaks. continue with strengthening exercises of the LE's,  functional ambulation when appropriate.     Norman Herrlich PT Physical Therapist- [place]  [facility]   12:30 PM 01/09/23

## 2023-01-14 ENCOUNTER — Ambulatory Visit: Payer: 59 | Admitting: Physical Therapy

## 2023-01-16 ENCOUNTER — Ambulatory Visit: Payer: 59 | Admitting: Physical Therapy

## 2023-01-16 DIAGNOSIS — M6281 Muscle weakness (generalized): Secondary | ICD-10-CM

## 2023-01-16 DIAGNOSIS — R269 Unspecified abnormalities of gait and mobility: Secondary | ICD-10-CM

## 2023-01-16 DIAGNOSIS — R262 Difficulty in walking, not elsewhere classified: Secondary | ICD-10-CM

## 2023-01-16 DIAGNOSIS — R2681 Unsteadiness on feet: Secondary | ICD-10-CM

## 2023-01-16 DIAGNOSIS — R2689 Other abnormalities of gait and mobility: Secondary | ICD-10-CM

## 2023-01-16 NOTE — Therapy (Signed)
OUTPATIENT PHYSICAL THERAPY NEURO TREATMENT      Patient Name: Curtis Powell MRN: [id number] DOB:[birth year], 57 y.o., male Today's Date: 01/16/2023  PCP: Enid Baas, MD  REFERRING PROVIDER: Enid Baas, MD    PT End of Session - 01/16/23 1116     Visit Number 28    Number of Visits 46    Date for PT Re-Evaluation 03/18/23    Progress Note Due on Visit 30    PT Start Time 1105    PT Stop Time 1148    PT Time Calculation (min) 43 min    Equipment Utilized During Treatment Gait belt    Activity Tolerance Patient tolerated treatment well;No increased pain    Behavior During Therapy WFL for tasks assessed/performed                             Past Medical History:  Diagnosis Date   Asthma    GERD (gastroesophageal reflux disease)    Hyperlipidemia    Hypertension    Obesity    Stroke [facility])    Past Surgical History:  Procedure Laterality Date   BRAIN SURGERY     Patient Active Problem List   Diagnosis Date Noted   Impaired glucose tolerance 03/23/2020   Learning disability 03/23/2020   Urinary and fecal incontinence 06/14/2019   Chronic pain syndrome 03/23/2019   Constipation 03/23/2019   Dysarthria 03/23/2019   Dysphagia 03/23/2019   Learning difficulty 03/23/2019   Morbid obesity (HCC) 03/23/2019   Muscle weakness 03/23/2019   Vitamin D deficiency 03/23/2019   Bilateral carotid artery stenosis 12/15/2018   Moderate aortic valve stenosis 12/15/2018   BMI 45.0-49.9, adult (HCC) 11/10/2018   Cerebrovascular accident (HCC) 05/02/2016   Hemiparesis affecting right side as late effect of cerebrovascular accident (HCC) 05/01/2016   Brain tumor (HCC) 11/24/2014   Gastro-esophageal reflux disease without esophagitis 11/24/2014   Accident due to mechanical fall without injury 08/03/2014   Essential hypertension 06/13/2014   Mixed hyperlipidemia 06/13/2014   Unspecified sequelae of cerebral infarction 06/13/2014   Thyromegaly  01/07/2014   Type 2 diabetes mellitus without complication (HCC) 01/06/2014   Neck mass 09/08/2013   Swimmer's ear 09/08/2013   Erectile dysfunction 01/03/2013   Prediabetes 01/03/2013   ONSET DATE: 05/09/2016  REFERRING DIAG: W09.811 (ICD-10-CM) - Hemiplegia and hemiparesis following cerebral infarction affecting right dominant side   THERAPY DIAG:  Unsteadiness on feet  Difficulty in walking, not elsewhere classified  Other abnormalities of gait and mobility  Abnormality of gait and mobility  Muscle weakness (generalized)  Rationale for Evaluation and Treatment Rehabilitation  SUBJECTIVE:  SUBJECTIVE STATEMENT  Pt reports doing well today. Pt denies any recent falls/stumbles since prior session. Pt denies any updates to medications or medical appointment since prior session. Pt reports good compliance with HEP when time permits.    Pt accompanied by: self/  PERTINENT HISTORY:  The pt is a pleasant 57 yo male who returns to PT for evaluation of deficits s/o old CVA. Pt with hx of CVA Feb 2018. He was previously fully independent, but CVA caused significant deficits, mainly affecting his right side. Pt reports continued difficulty with walking and transfers. Pt still using WC for majority of mobility, otherwise he uses his RW. Pt lives with his mom who is his main caregiver. Patient had outpatient PT previously in clinic in 2023. He reports since discharge he has been completing his HEP and walking about every day. Pt would like to improve his walking mainly, but also would like to work on balance. He also reports difficulty with transfers. Pt's mom and his caregiver helps him with ADLs. However, he says he is now waiting for a new caregiver. Prior to this pt had caregiver 3x/week. PMH significant  for hx of brain tumor, past CVA, DMII and HTN. See chart for additional information.  PAIN:  Are you having pain? Pt reports no pain currently  PRECAUTIONS: fall  WEIGHT BEARING RESTRICTIONS none  PLOF: required assistance with ADLs (old CVA), prior to CVA in 2018 was indep.  History of Falls: Pt reports no recent falls  Home information: Pt lives with mother who is his primary caregiver. Previously had home aid, 3x/week but reports currently waiting for new one.  PATIENT GOALS: Pt would like to improve his ability to ambulate.  OBJECTIVE:  taken at [facility] unless otherwise noted:   DIAGNOSTIC FINDINGS: No recent imaging per chart  Objective measures taken at eval unless otherwise specified:   Posture: forward head posture, rounded shoulders  MMT LE: grossly  4+/5, exception hip flexors and abductors 4/5 each bilat   Functional tests:  5xSTS:  34 sec, use of BUE (multiple initial attempts with posterior LOB in chair)   : 0.096 m/s with RW, close CGA, and WC follow  Gait for distance (goal 150 ft): deferred due to time  Gait mechanics: impaired, decreased speed (see ), crouched posture, poor postural stability and relies heavily on BUE support on RW. Pt now with bilat AFOS   FOTO: 44 (goal 50)    TODAY'S TREATMENT  01/16/23   TE STS, transition to nustep with BRW x 10 ft then transfer  Nustep LE only with seatbelt donned for safety x 6 min  Therapeutic Activities:  Static stand // bars for  bout of 15 minutes with 45-60 sec " standing rest" between each lap in // bars; standing with good posture between exercises, UE support unless specified   -walk with cues for maintaining forward momentum.  - retro walk  - side stepping -turns in // bars ( 180) -knee bends - static stance without UE support x 30 sec  -turns in // bars,      PATIENT EDUCATION: Education details: Pt educated throughout session about proper posture and technique with exercises.  Improved exercise technique, movement at target joints, use of target muscles after min to mod verbal, visual, tactile cues.   Person educated: Patient Education method: Explanation, Demonstration, Tactile cues, and Verbal cues Education comprehension: verbalized understanding, returned demonstration, and verbal cues required  HOME EXERCISE PROGRAM:  Reviewed HEP in session but unable to issue  printout today due to printer error - will provide printout next visit  Access Code: [email or web address] URL: [email or web address] Date: 10/01/2022 Prepared by: Temple Pacini  Exercises - Seated March  - 1 x daily - 6 x weekly - 3 sets - 10 reps - Seated Hip Abduction  - 1 x daily - 6 x weekly - 2 sets - 10 reps  GOALS: Goals reviewed with patient? No  SHORT TERM GOALS: Target date: 11/12/2022  Pt will be independent with HEP in order to improve strength and balance in order to decrease fall risk and improve function at home. Baseline: instructed in HEP today, to provide printout next visit Goal status: MET    LONG TERM GOALS: Target date: 03/18/2023  Pt will increase FOTO to at least 50 in order to demonstrate improvement in function related to mobility and QoL. Baseline: 44 7/29:48 9/10:40 12/24/2022= 51 Goal status: MET  2.  Pt (< 60 yrs old) will complete 5xSTS test < 30 seconds indicating an increase in LE strength and improved balance. Baseline: 10/01/22 34 sec (multiple initial attempts with posterior LOB in chair) 12/20: 49 seconds 11/04/22:1:06, min A on multiple attempts to maintain balance and pt stays in flexed posture throughout test when coming to standing.   9/10: 27.5 sec, Pushes WC post consistently, close CGA  Goal status: ONGOING  3.  Pt will increase to > 0.5 m/s (30 sec) as to improve gait speed for better home ambulation and reduce fall risk. Baseline: 10/01/22 0.096 m/s with RW, close CGA, and WC follow previously 12/27: 0.11 m/s 11/04/22: .056 m/s  9/10: 076  m/s Goal status: ONGOING   4.  Pt will increase hip flexor and abductor  gross strength to 4+/5 bilat as to improve functional strength for independent gait, increased standing tolerance, and increased ADL ability. Baseline: 10/01/22 4+/5, exception hip flexors and abductors 4/5 each bilat  (previously 03/27/22: 4+/5 ) 9/10: Hip flexor 4/5, hip abductor 4+/5 Goal status: ONGOING  5.  Pt will ambulate 150 ft with BRW and WC follow with no rest breaks to allow for improved mobility within home and increased independence. Baseline: 10/03/22 150' with BRW 04/03/22: 105 ft with BRW 7/29: 100 in 9:30 with pediatric RW 9/10:101 ft with BRW stooped posture worsened throughout test 12/24/2022- 150 feet using BRW, CGA with w/c follow- in 10 min 17 sec. Will keep goal to make sure he can do this consistently Goal status: ONGOING    ASSESSMENT :  CLINICAL IMPRESSION:  Continued per plan of care with focus on standing activities.  Patient continues with exercises to improve his posture as well as to improve his postural endurance and ambulatory efficiency and capacity.  Patient showed good progress with ambulation in parallel bars showing good for momentum throughout without having posterior weight shift when stepping onto each leg. Pt able to stand for 15 min prolonged bout this date showing improved postural endurance. Pt will continue to benefit from skilled physical therapy intervention to address impairments, improve QOL, and attain therapy goals.    OBJECTIVE IMPAIRMENTS Abnormal gait, decreased activity tolerance, decreased balance, decreased coordination, decreased endurance, decreased mobility, difficulty walking, decreased ROM, decreased strength, improper body mechanics, postural dysfunction, and obesity.   ACTIVITY LIMITATIONS carrying, lifting, bending, sitting, standing, squatting, stairs, transfers, bed mobility, continence, bathing, toileting, dressing, hygiene/grooming, and locomotion  level  PARTICIPATION LIMITATIONS: meal prep, cleaning, laundry, interpersonal relationship, driving, shopping, community activity, and yard work  PERSONAL FACTORS Fitness, Past/current experiences, Time since onset of  injury/illness/exacerbation, and 3+ comorbidities: hx of brain tumor, past CVA, HTN, obesity, learning disability, DMII  are also affecting patient's functional outcome.   REHAB POTENTIAL: Fair    CLINICAL DECISION MAKING: Stable/uncomplicated  EVALUATION COMPLEXITY: Low  PLAN: PT FREQUENCY: 1-2x/week  PT DURATION: 12 weeks  PLANNED INTERVENTIONS: Therapeutic exercises, Therapeutic activity, Neuromuscular re-education, Balance training, Gait training, Patient/Family education, Joint mobilization, Stair training, Vestibular training, Orthotic/Fit training, DME instructions, Dry Needling, Electrical stimulation, Wheelchair mobility training, Cryotherapy, Moist heat, Compression bandaging, Manual therapy, and Re-evaluation  PLAN FOR NEXT SESSION:  As much as possible / is tolerated have patient stand with good posture during his rest breaks. continue with strengthening exercises of the LE's,  functional ambulation when appropriate.     Norman Herrlich PT Physical Therapist- [facility]   11:17 AM 01/16/23

## 2023-01-21 ENCOUNTER — Ambulatory Visit: Payer: 59 | Admitting: Physical Therapy

## 2023-01-23 ENCOUNTER — Ambulatory Visit: Payer: 59 | Admitting: Physical Therapy

## 2023-01-23 DIAGNOSIS — R269 Unspecified abnormalities of gait and mobility: Secondary | ICD-10-CM

## 2023-01-23 DIAGNOSIS — R2681 Unsteadiness on feet: Secondary | ICD-10-CM

## 2023-01-23 DIAGNOSIS — R2689 Other abnormalities of gait and mobility: Secondary | ICD-10-CM

## 2023-01-23 DIAGNOSIS — M6281 Muscle weakness (generalized): Secondary | ICD-10-CM

## 2023-01-23 DIAGNOSIS — R262 Difficulty in walking, not elsewhere classified: Secondary | ICD-10-CM

## 2023-01-23 NOTE — Therapy (Signed)
OUTPATIENT PHYSICAL THERAPY NEURO TREATMENT      Patient Name: Curtis Powell MRN: [id number] DOB:04/02/1966, 57 y.o., male Today's Date: 01/23/2023  PCP: Enid Baas, MD  REFERRING PROVIDER: Enid Baas, MD    PT End of Session - 01/23/23 1156     Visit Number 29    Number of Visits 46    Date for PT Re-Evaluation 03/18/23    Progress Note Due on Visit 30    PT Start Time 1147    PT Stop Time 1228    PT Time Calculation (min) 41 min    Equipment Utilized During Treatment Gait belt    Activity Tolerance Patient tolerated treatment well;No increased pain    Behavior During Therapy WFL for tasks assessed/performed                             Past Medical History:  Diagnosis Date   Asthma    GERD (gastroesophageal reflux disease)    Hyperlipidemia    Hypertension    Obesity    Stroke [facility])    Past Surgical History:  Procedure Laterality Date   BRAIN SURGERY     Patient Active Problem List   Diagnosis Date Noted   Impaired glucose tolerance 03/23/2020   Learning disability 03/23/2020   Urinary and fecal incontinence 06/14/2019   Chronic pain syndrome 03/23/2019   Constipation 03/23/2019   Dysarthria 03/23/2019   Dysphagia 03/23/2019   Learning difficulty 03/23/2019   Morbid obesity (HCC) 03/23/2019   Muscle weakness 03/23/2019   Vitamin D deficiency 03/23/2019   Bilateral carotid artery stenosis 12/15/2018   Moderate aortic valve stenosis 12/15/2018   BMI 45.0-49.9, adult (HCC) 11/10/2018   Cerebrovascular accident (HCC) 05/02/2016   Hemiparesis affecting right side as late effect of cerebrovascular accident (HCC) 05/01/2016   Brain tumor (HCC) 11/24/2014   Gastro-esophageal reflux disease without esophagitis 11/24/2014   Accident due to mechanical fall without injury 08/03/2014   Essential hypertension 06/13/2014   Mixed hyperlipidemia 06/13/2014   Unspecified sequelae of cerebral infarction 06/13/2014   Thyromegaly  01/07/2014   Type 2 diabetes mellitus without complication (HCC) 01/06/2014   Neck mass 09/08/2013   Swimmer's ear 09/08/2013   Erectile dysfunction 01/03/2013   Prediabetes 01/03/2013   ONSET DATE: 05/09/2016  REFERRING DIAG: O96.295 (ICD-10-CM) - Hemiplegia and hemiparesis following cerebral infarction affecting right dominant side   THERAPY DIAG:  Unsteadiness on feet  Difficulty in walking, not elsewhere classified  Other abnormalities of gait and mobility  Abnormality of gait and mobility  Muscle weakness (generalized)  Rationale for Evaluation and Treatment Rehabilitation  SUBJECTIVE:  SUBJECTIVE STATEMENT  Pt reports doing well today. Pt denies any recent falls/stumbles since prior session. Pt denies any updates to medications or medical appointment since prior session. Pt reports good compliance with HEP when time permits.    Pt accompanied by: self/  PERTINENT HISTORY:  The pt is a pleasant 57 yo male who returns to PT for evaluation of deficits s/o old CVA. Pt with hx of CVA Feb 2018. He was previously fully independent, but CVA caused significant deficits, mainly affecting his right side. Pt reports continued difficulty with walking and transfers. Pt still using WC for majority of mobility, otherwise he uses his RW. Pt lives with his mom who is his main caregiver. Patient had outpatient PT previously in clinic in 2023. He reports since discharge he has been completing his HEP and walking about every day. Pt would like to improve his walking mainly, but also would like to work on balance. He also reports difficulty with transfers. Pt's mom and his caregiver helps him with ADLs. However, he says he is now waiting for a new caregiver. Prior to this pt had caregiver 3x/week. PMH significant  for hx of brain tumor, past CVA, DMII and HTN. See chart for additional information.  PAIN:  Are you having pain? Pt reports no pain currently  PRECAUTIONS: fall  WEIGHT BEARING RESTRICTIONS none  PLOF: required assistance with ADLs (old CVA), prior to CVA in 2018 was indep.  History of Falls: Pt reports no recent falls  Home information: Pt lives with mother who is his primary caregiver. Previously had home aid, 3x/week but reports currently waiting for new one.  PATIENT GOALS: Pt would like to improve his ability to ambulate.  OBJECTIVE:  taken at [facility] unless otherwise noted:   DIAGNOSTIC FINDINGS: No recent imaging per chart  Objective measures taken at eval unless otherwise specified:   Posture: forward head posture, rounded shoulders  MMT LE: grossly  4+/5, exception hip flexors and abductors 4/5 each bilat   Functional tests:  5xSTS:  34 sec, use of BUE (multiple initial attempts with posterior LOB in chair)   : 0.096 m/s with RW, close CGA, and WC follow  Gait for distance (goal 150 ft): deferred due to time  Gait mechanics: impaired, decreased speed (see ), crouched posture, poor postural stability and relies heavily on BUE support on RW. Pt now with bilat AFOS   FOTO: 44 (goal 50)    TODAY'S TREATMENT  01/23/23   TE STS, transition to nustep with BRW x 10 ft then transfer  Nustep LE only with seatbelt donned for safety x 8 min  Therapeutic Activities:  Static stand // bars for  bout of 15 minutes with 45-60 sec " standing rest" between each lap in // bars; standing with good posture between exercises, UE support unless specified   -walk with cues for maintaining forward momentum.  - retro walk  - side stepping -turns in // bars ( 180) -knee bends - static stance without UE support x 10 sec, tactile cues for erect posture -turns in // bars,   TE In WC HS curls x 10 ft moving WC, slow but is able to consistently move in straight line.    TA STS transfer training x 2 reps from [facility] to/from standard chair, cues for forward lean, difficulty with transition from sitting to standing with walker.    PATIENT EDUCATION: Education details: Pt educated throughout session about proper posture and technique with exercises. Improved exercise technique, movement at  target joints, use of target muscles after min to mod verbal, visual, tactile cues.   Person educated: Patient Education method: Explanation, Demonstration, Tactile cues, and Verbal cues Education comprehension: verbalized understanding, returned demonstration, and verbal cues required  HOME EXERCISE PROGRAM:  Reviewed HEP in session but unable to issue printout today due to printer error - will provide printout next visit  Access Code: [email or web address] URL: [email or web address] Date: 10/01/2022 Prepared by: Temple Pacini  Exercises - Seated March  - 1 x daily - 6 x weekly - 3 sets - 10 reps - Seated Hip Abduction  - 1 x daily - 6 x weekly - 2 sets - 10 reps  GOALS: Goals reviewed with patient? No  SHORT TERM GOALS: Target date: 11/12/2022  Pt will be independent with HEP in order to improve strength and balance in order to decrease fall risk and improve function at home. Baseline: instructed in HEP today, to provide printout next visit Goal status: MET    LONG TERM GOALS: Target date: 03/18/2023  Pt will increase FOTO to at least 50 in order to demonstrate improvement in function related to mobility and QoL. Baseline: 44 7/29:48 9/10:40 12/24/2022= 51 Goal status: MET  2.  Pt (< 60 yrs old) will complete 5xSTS test < 30 seconds indicating an increase in LE strength and improved balance. Baseline: 10/01/22 34 sec (multiple initial attempts with posterior LOB in chair) 12/20: 49 seconds 11/04/22:1:06, min A on multiple attempts to maintain balance and pt stays in flexed posture throughout test when coming to standing.   9/10: 27.5 sec, Pushes WC post  consistently, close CGA  Goal status: ONGOING  3.  Pt will increase to > 0.5 m/s (30 sec) as to improve gait speed for better home ambulation and reduce fall risk. Baseline: 10/01/22 0.096 m/s with RW, close CGA, and WC follow previously 12/27: 0.11 m/s 11/04/22: .056 m/s  9/10: 076 m/s Goal status: ONGOING   4.  Pt will increase hip flexor and abductor  gross strength to 4+/5 bilat as to improve functional strength for independent gait, increased standing tolerance, and increased ADL ability. Baseline: 10/01/22 4+/5, exception hip flexors and abductors 4/5 each bilat  (previously 03/27/22: 4+/5 ) 9/10: Hip flexor 4/5, hip abductor 4+/5 Goal status: ONGOING  5.  Pt will ambulate 150 ft with BRW and WC follow with no rest breaks to allow for improved mobility within home and increased independence. Baseline: 10/03/22 150' with BRW 04/03/22: 105 ft with BRW 7/29: 100 in 9:30 with pediatric RW 9/10:101 ft with BRW stooped posture worsened throughout test 12/24/2022- 150 feet using BRW, CGA with w/c follow- in 10 min 17 sec. Will keep goal to make sure he can do this consistently Goal status: ONGOING    ASSESSMENT :  CLINICAL IMPRESSION:  Continued per plan of care with focus on standing activities.  Patient continues with exercises to improve his posture as well as to improve his postural endurance and ambulatory efficiency and capacity.  Patient showed good progress with ambulation in parallel bars showing good for momentum throughout without having posterior weight shift when stepping onto each leg. Pt able to stand for 15 min prolonged bout this date showing improved postural endurance and reports increased ease this date compared to previously. Pt will continue to benefit from skilled physical therapy intervention to address impairments, improve QOL, and attain therapy goals.    OBJECTIVE IMPAIRMENTS Abnormal gait, decreased activity tolerance, decreased balance, decreased coordination,  decreased endurance, decreased mobility, difficulty  walking, decreased ROM, decreased strength, improper body mechanics, postural dysfunction, and obesity.   ACTIVITY LIMITATIONS carrying, lifting, bending, sitting, standing, squatting, stairs, transfers, bed mobility, continence, bathing, toileting, dressing, hygiene/grooming, and locomotion level  PARTICIPATION LIMITATIONS: meal prep, cleaning, laundry, interpersonal relationship, driving, shopping, community activity, and yard work  PERSONAL FACTORS Fitness, Past/current experiences, Time since onset of injury/illness/exacerbation, and 3+ comorbidities: hx of brain tumor, past CVA, HTN, obesity, learning disability, DMII  are also affecting patient's functional outcome.   REHAB POTENTIAL: Fair    CLINICAL DECISION MAKING: Stable/uncomplicated  EVALUATION COMPLEXITY: Low  PLAN: PT FREQUENCY: 1-2x/week  PT DURATION: 12 weeks  PLANNED INTERVENTIONS: Therapeutic exercises, Therapeutic activity, Neuromuscular re-education, Balance training, Gait training, Patient/Family education, Joint mobilization, Stair training, Vestibular training, Orthotic/Fit training, DME instructions, Dry Needling, Electrical stimulation, Wheelchair mobility training, Cryotherapy, Moist heat, Compression bandaging, Manual therapy, and Re-evaluation  PLAN FOR NEXT SESSION:  As much as possible / is tolerated have patient stand with good posture during his rest breaks. continue with strengthening exercises of the LE's,  functional ambulation when appropriate.     Norman Herrlich PT Physical Therapist- [facility]   11:56 AM 01/23/23

## 2023-01-28 ENCOUNTER — Ambulatory Visit: Payer: 59

## 2023-01-28 DIAGNOSIS — R262 Difficulty in walking, not elsewhere classified: Secondary | ICD-10-CM

## 2023-01-28 DIAGNOSIS — R2681 Unsteadiness on feet: Secondary | ICD-10-CM

## 2023-01-28 DIAGNOSIS — R2689 Other abnormalities of gait and mobility: Secondary | ICD-10-CM

## 2023-01-28 NOTE — Therapy (Signed)
OUTPATIENT PHYSICAL THERAPY NEURO TREATMENT / Physical Therapy Progress Note   Dates of reporting period  12/17/22   to   01/28/23       Patient Name: Curtis Powell MRN: [id number] DOB:October 21, 1965, 57 y.o., male Today's Date: 01/28/2023  PCP: Enid Baas, MD  REFERRING PROVIDER: Enid Baas, MD    PT End of Session - 01/28/23 1050     Visit Number 30    Number of Visits 46    Date for PT Re-Evaluation 03/18/23    Progress Note Due on Visit 30    PT Start Time 1100    PT Stop Time 1144    PT Time Calculation (min) 44 min    Equipment Utilized During Treatment Gait belt    Activity Tolerance Patient tolerated treatment well;No increased pain    Behavior During Therapy WFL for tasks assessed/performed                             Past Medical History:  Diagnosis Date   Asthma    GERD (gastroesophageal reflux disease)    Hyperlipidemia    Hypertension    Obesity    Stroke [facility])    Past Surgical History:  Procedure Laterality Date   BRAIN SURGERY     Patient Active Problem List   Diagnosis Date Noted   Impaired glucose tolerance 03/23/2020   Learning disability 03/23/2020   Urinary and fecal incontinence 06/14/2019   Chronic pain syndrome 03/23/2019   Constipation 03/23/2019   Dysarthria 03/23/2019   Dysphagia 03/23/2019   Learning difficulty 03/23/2019   Morbid obesity (HCC) 03/23/2019   Muscle weakness 03/23/2019   Vitamin D deficiency 03/23/2019   Bilateral carotid artery stenosis 12/15/2018   Moderate aortic valve stenosis 12/15/2018   BMI 45.0-49.9, adult (HCC) 11/10/2018   Cerebrovascular accident (HCC) 05/02/2016   Hemiparesis affecting right side as late effect of cerebrovascular accident (HCC) 05/01/2016   Brain tumor (HCC) 11/24/2014   Gastro-esophageal reflux disease without esophagitis 11/24/2014   Accident due to mechanical fall without injury 08/03/2014   Essential hypertension 06/13/2014   Mixed  hyperlipidemia 06/13/2014   Unspecified sequelae of cerebral infarction 06/13/2014   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/2014   Type 2 diabetes mellitus without complication (HCC) 01/06/2014   Neck mass 09/08/2013   Swimmer's ear 09/08/2013   Erectile dysfunction 01/03/2013   Prediabetes 01/03/2013   ONSET DATE: 05/09/2016  REFERRING DIAG: W09.811 (ICD-10-CM) - Hemiplegia and hemiparesis following cerebral infarction affecting right dominant side   THERAPY DIAG:  Unsteadiness on feet  Difficulty in walking, not elsewhere classified  Other abnormalities of gait and mobility  Rationale for Evaluation and Treatment Rehabilitation  SUBJECTIVE:  SUBJECTIVE STATEMENT Patient came an hour early.    Pt accompanied by: self/  PERTINENT HISTORY:  The pt is a pleasant 57 yo male who returns to PT for evaluation of deficits s/o old CVA. Pt with hx of CVA Feb 2018. He was previously fully independent, but CVA caused significant deficits, mainly affecting his right side. Pt reports continued difficulty with walking and transfers. Pt still using WC for majority of mobility, otherwise he uses his RW. Pt lives with his mom who is his main caregiver. Patient had outpatient PT previously in clinic in 2023. He reports since discharge he has been completing his HEP and walking about every day. Pt would like to improve his walking mainly, but also would like to work on balance. He also reports difficulty with transfers. Pt's mom and his caregiver helps him with ADLs. However, he says he is now waiting for a new caregiver. Prior to this pt had caregiver 3x/week. PMH significant for hx of brain tumor, past CVA, DMII and HTN. See chart for additional information.  PAIN:  Are you having pain? Pt reports no pain currently  PRECAUTIONS:  fall  WEIGHT BEARING RESTRICTIONS none  PLOF: required assistance with ADLs (old CVA), prior to CVA in 2018 was indep.  History of Falls: Pt reports no recent falls  Home information: Pt lives with mother who is his primary caregiver. Previously had home aid, 3x/week but reports currently waiting for new one.  PATIENT GOALS: Pt would like to improve his ability to ambulate.  OBJECTIVE:  taken at [facility] unless otherwise noted:   DIAGNOSTIC FINDINGS: No recent imaging per chart  Objective measures taken at eval unless otherwise specified:   Posture: forward head posture, rounded shoulders  MMT LE: grossly  4+/5, exception hip flexors and abductors 4/5 each bilat   Functional tests:  5xSTS:  34 sec, use of BUE (multiple initial attempts with posterior LOB in chair)   : 0.096 m/s with RW, close CGA, and WC follow  Gait for distance (goal 150 ft): deferred due to time  Gait mechanics: impaired, decreased speed (see ), crouched posture, poor postural stability and relies heavily on BUE support on RW. Pt now with bilat AFOS   FOTO: 44 (goal 50)    TODAY'S TREATMENT  01/28/23  Physical therapy treatment session today consisted of completing assessment of goals and administration of testing as demonstrated and documented in flow sheet, treatment, and goals section of this note. Addition treatments may be found below.   Unable to make a full 5x STS second attempt  Seated:  5lb ankle weights: -LAQ 10x each LE 2 sets -marches 10x each LE 2 sets   PATIENT EDUCATION: Education details: Pt educated throughout session about proper posture and technique with exercises. Improved exercise technique, movement at target joints, use of target muscles after min to mod verbal, visual, tactile cues.   Person educated: Patient Education method: Explanation, Demonstration, Tactile cues, and Verbal cues Education comprehension: verbalized understanding, returned demonstration, and  verbal cues required  HOME EXERCISE PROGRAM:  Reviewed HEP in session but unable to issue printout today due to printer error - will provide printout next visit  Access Code: [email or web address] URL: [email or web address] Date: 10/01/2022 Prepared by: Temple Pacini  Exercises - Seated March  - 1 x daily - 6 x weekly - 3 sets - 10 reps - Seated Hip Abduction  - 1 x daily - 6 x weekly - 2 sets - 10 reps  GOALS: Goals reviewed with patient?  No  SHORT TERM GOALS: Target date: 11/12/2022  Pt will be independent with HEP in order to improve strength and balance in order to decrease fall risk and improve function at home. Baseline: instructed in HEP today, to provide printout next visit Goal status: MET    LONG TERM GOALS: Target date: 03/18/2023  Pt will increase FOTO to at least 50 in order to demonstrate improvement in function related to mobility and QoL. Baseline: 44 7/29:48 9/10:40 12/24/2022= 51 Goal status: MET  2.  Pt (< 60 yrs old) will complete 5xSTS test < 30 seconds indicating an increase in LE strength and improved balance. Baseline: 10/01/22 34 sec (multiple initial attempts with posterior LOB in chair) 12/20: 49 seconds 11/04/22:1:06, min A on multiple attempts to maintain balance and pt stays in flexed posture throughout test when coming to standing.   9/10: 27.5 sec, Pushes WC post consistently, close CGA  10/22: 47 seconds first attempt Goal status: ONGOING  3.  Pt will increase to > 0.5 m/s (30 sec) as to improve gait speed for better home ambulation and reduce fall risk. Baseline: 10/01/22 0.096 m/s with RW, close CGA, and WC follow previously 12/27: 0.11 m/s 11/04/22: .056 m/s  9/10: 076 m/s 10/22: 0.64 m/s  Goal status: ONGOING   4.  Pt will increase hip flexor and abductor  gross strength to 4+/5 bilat as to improve functional strength for independent gait, increased standing tolerance, and increased ADL ability. Baseline: 10/01/22 4+/5, exception hip  flexors and abductors 4/5 each bilat  (previously 03/27/22: 4+/5 ) 9/10: Hip flexor 4/5, hip abductor 4+/5 10/22: hip flexor 4/5 hip abductor 4+/5 Goal status: ONGOING  5.  Pt will ambulate 150 ft with BRW and WC follow with no rest breaks to allow for improved mobility within home and increased independence. Baseline: 10/03/22 150' with BRW 04/03/22: 105 ft with BRW 7/29: 100 in 9:30 with pediatric RW 9/10:101 ft with BRW stooped posture worsened throughout test 12/24/2022- 150 feet using BRW, CGA with w/c follow- in 10 min 17 sec. Will keep goal to make sure he can do this consistently 10/22: 119 ft with BRW 8 min 23 seconds terminated by patient  Goal status: ONGOING    ASSESSMENT :  CLINICAL IMPRESSION:  Patient's condition has the potential to improve in response to therapy. Maximum improvement is yet to be obtained. The anticipated improvement is attainable and reasonable in a generally predictable time. Patients goals are decreased since he was tested last, patient reports he is tired.  Pt will continue to benefit from skilled physical therapy intervention to address impairments, improve QOL, and attain therapy goals.    OBJECTIVE IMPAIRMENTS Abnormal gait, decreased activity tolerance, decreased balance, decreased coordination, decreased endurance, decreased mobility, difficulty walking, decreased ROM, decreased strength, improper body mechanics, postural dysfunction, and obesity.   ACTIVITY LIMITATIONS carrying, lifting, bending, sitting, standing, squatting, stairs, transfers, bed mobility, continence, bathing, toileting, dressing, hygiene/grooming, and locomotion level  PARTICIPATION LIMITATIONS: meal prep, cleaning, laundry, interpersonal relationship, driving, shopping, community activity, and yard work  PERSONAL FACTORS Fitness, Past/current experiences, Time since onset of injury/illness/exacerbation, and 3+ comorbidities: hx of brain tumor, past CVA, HTN, obesity, learning  disability, DMII  are also affecting patient's functional outcome.   REHAB POTENTIAL: Fair    CLINICAL DECISION MAKING: Stable/uncomplicated  EVALUATION COMPLEXITY: Low  PLAN: PT FREQUENCY: 1-2x/week  PT DURATION: 12 weeks  PLANNED INTERVENTIONS: Therapeutic exercises, Therapeutic activity, Neuromuscular re-education, Balance training, Gait training, Patient/Family education, Joint mobilization, Stair training,  Vestibular training, Orthotic/Fit training, DME instructions, Dry Needling, Electrical stimulation, Wheelchair mobility training, Cryotherapy, Moist heat, Compression bandaging, Manual therapy, and Re-evaluation  PLAN FOR NEXT SESSION:  As much as possible / is tolerated have patient stand with good posture during his rest breaks. continue with strengthening exercises of the LE's,  functional ambulation when appropriate.     Precious Bard PT Physical Therapist- [place]  [facility]   12:36 PM 01/28/23

## 2023-01-30 ENCOUNTER — Ambulatory Visit: Payer: 59 | Admitting: Physical Therapy

## 2023-01-30 DIAGNOSIS — R269 Unspecified abnormalities of gait and mobility: Secondary | ICD-10-CM

## 2023-01-30 DIAGNOSIS — R2689 Other abnormalities of gait and mobility: Secondary | ICD-10-CM

## 2023-01-30 DIAGNOSIS — M6281 Muscle weakness (generalized): Secondary | ICD-10-CM

## 2023-01-30 DIAGNOSIS — R2681 Unsteadiness on feet: Secondary | ICD-10-CM

## 2023-01-30 DIAGNOSIS — R262 Difficulty in walking, not elsewhere classified: Secondary | ICD-10-CM

## 2023-01-30 NOTE — Therapy (Signed)
OUTPATIENT PHYSICAL THERAPY NEURO TREATMENT      Patient Name: Curtis Powell MRN: [id number] DOB:05-17-1965, 57 y.o., male Today's Date: 01/31/2023  PCP: Enid Baas, MD  REFERRING PROVIDER: Enid Baas, MD    PT End of Session - 01/30/23 1402     Visit Number 31    Number of Visits 46    Date for PT Re-Evaluation 03/18/23    Progress Note Due on Visit 40    PT Start Time 1403    PT Stop Time 1444    PT Time Calculation (min) 41 min    Equipment Utilized During Treatment Gait belt    Activity Tolerance Patient tolerated treatment well;No increased pain    Behavior During Therapy WFL for tasks assessed/performed                             Past Medical History:  Diagnosis Date   Asthma    GERD (gastroesophageal reflux disease)    Hyperlipidemia    Hypertension    Obesity    Stroke [facility])    Past Surgical History:  Procedure Laterality Date   BRAIN SURGERY     Patient Active Problem List   Diagnosis Date Noted   Impaired glucose tolerance 03/23/2020   Learning disability 03/23/2020   Urinary and fecal incontinence 06/14/2019   Chronic pain syndrome 03/23/2019   Constipation 03/23/2019   Dysarthria 03/23/2019   Dysphagia 03/23/2019   Learning difficulty 03/23/2019   Morbid obesity (HCC) 03/23/2019   Muscle weakness 03/23/2019   Vitamin D deficiency 03/23/2019   Bilateral carotid artery stenosis 12/15/2018   Moderate aortic valve stenosis 12/15/2018   BMI 45.0-49.9, adult (HCC) 11/10/2018   Cerebrovascular accident (HCC) 05/02/2016   Hemiparesis affecting right side as late effect of cerebrovascular accident (HCC) 05/01/2016   Brain tumor (HCC) 11/24/2014   Gastro-esophageal reflux disease without esophagitis 11/24/2014   Accident due to mechanical fall without injury 08/03/2014   Essential hypertension 06/13/2014   Mixed hyperlipidemia 06/13/2014   Unspecified sequelae of cerebral infarction 06/13/2014   Thyromegaly  01/07/2014   Type 2 diabetes mellitus without complication (HCC) 01/06/2014   Neck mass 09/08/2013   Swimmer's ear 09/08/2013   Erectile dysfunction 01/03/2013   Prediabetes 01/03/2013   ONSET DATE: 05/09/2016  REFERRING DIAG: O13.086 (ICD-10-CM) - Hemiplegia and hemiparesis following cerebral infarction affecting right dominant side   THERAPY DIAG:  Unsteadiness on feet  Difficulty in walking, not elsewhere classified  Other abnormalities of gait and mobility  Abnormality of gait and mobility  Muscle weakness (generalized)  Rationale for Evaluation and Treatment Rehabilitation  SUBJECTIVE:  SUBJECTIVE STATEMENT Patient presents with good motivation for completion of physical therapy activities.  Patient apologizes for not having a good day at his last progress note but he says he will continue to work hard to improve at home.   Pt accompanied by: self/  PERTINENT HISTORY:  The pt is a pleasant 57 yo male who returns to PT for evaluation of deficits s/o old CVA. Pt with hx of CVA Feb 2018. He was previously fully independent, but CVA caused significant deficits, mainly affecting his right side. Pt reports continued difficulty with walking and transfers. Pt still using WC for majority of mobility, otherwise he uses his RW. Pt lives with his mom who is his main caregiver. Patient had outpatient PT previously in clinic in 2023. He reports since discharge he has been completing his HEP and walking about every day. Pt would like to improve his walking mainly, but also would like to work on balance. He also reports difficulty with transfers. Pt's mom and his caregiver helps him with ADLs. However, he says he is now waiting for a new caregiver. Prior to this pt had caregiver 3x/week. PMH significant for hx  of brain tumor, past CVA, DMII and HTN. See chart for additional information.  PAIN:  Are you having pain? Pt reports no pain currently  PRECAUTIONS: fall  WEIGHT BEARING RESTRICTIONS none  PLOF: required assistance with ADLs (old CVA), prior to CVA in 2018 was indep.  History of Falls: Pt reports no recent falls  Home information: Pt lives with mother who is his primary caregiver. Previously had home aid, 3x/week but reports currently waiting for new one.  PATIENT GOALS: Pt would like to improve his ability to ambulate.  OBJECTIVE:  taken at [facility] unless otherwise noted:   DIAGNOSTIC FINDINGS: No recent imaging per chart  Objective measures taken at eval unless otherwise specified:   Posture: forward head posture, rounded shoulders  MMT LE: grossly  4+/5, exception hip flexors and abductors 4/5 each bilat   Functional tests:  5xSTS:  34 sec, use of BUE (multiple initial attempts with posterior LOB in chair)   : 0.096 m/s with RW, close CGA, and WC follow  Gait for distance (goal 150 ft): deferred due to time  Gait mechanics: impaired, decreased speed (see ), crouched posture, poor postural stability and relies heavily on BUE support on RW. Pt now with bilat AFOS   FOTO: 44 (goal 50)    TODAY'S TREATMENT  01/31/23  TE  STS then ambulates to mat table x 10 ft with B RW   Seated forward and lateral reaches to end range x 10 ea on Mat table   Seated LAQ and marching without UE support or trunk support 2 x 10 reps   STS from elevated plinth 3 x 8 reps, cues to keep feet on floor.    Ambulate to nustep with RW, hunches posture, heavy reliance on RW   Nustep LE only  Level 1 x 3 min focus on maintaining SPM throughout  Level 4 x 2 min focus on LE strength   Unless otherwise stated, CGA was provided and gait belt donned in order to ensure pt safety  PATIENT EDUCATION: Education details: Pt educated throughout session about proper posture and technique  with exercises. Improved exercise technique, movement at target joints, use of target muscles after min to mod verbal, visual, tactile cues.   Person educated: Patient Education method: Explanation, Demonstration, Tactile cues, and Verbal cues Education comprehension: verbalized understanding, returned  demonstration, and verbal cues required  HOME EXERCISE PROGRAM:  Reviewed HEP in session but unable to issue printout today due to printer error - will provide printout next visit  Access Code: [email or web address] URL: [email or web address] Date: 10/01/2022 Prepared by: Temple Pacini  Exercises - Seated March  - 1 x daily - 6 x weekly - 3 sets - 10 reps - Seated Hip Abduction  - 1 x daily - 6 x weekly - 2 sets - 10 reps  GOALS: Goals reviewed with patient? No  SHORT TERM GOALS: Target date: 11/12/2022  Pt will be independent with HEP in order to improve strength and balance in order to decrease fall risk and improve function at home. Baseline: instructed in HEP today, to provide printout next visit Goal status: MET    LONG TERM GOALS: Target date: 03/18/2023  Pt will increase FOTO to at least 50 in order to demonstrate improvement in function related to mobility and QoL. Baseline: 44 7/29:48 9/10:40 12/24/2022= 51 Goal status: MET  2.  Pt (< 60 yrs old) will complete 5xSTS test < 30 seconds indicating an increase in LE strength and improved balance. Baseline: 10/01/22 34 sec (multiple initial attempts with posterior LOB in chair) 12/20: 49 seconds 11/04/22:1:06, min A on multiple attempts to maintain balance and pt stays in flexed posture throughout test when coming to standing.   9/10: 27.5 sec, Pushes WC post consistently, close CGA  10/22: 47 seconds first attempt Goal status: ONGOING  3.  Pt will increase to > 0.5 m/s (30 sec) as to improve gait speed for better home ambulation and reduce fall risk. Baseline: 10/01/22 0.096 m/s with RW, close CGA, and WC follow  previously 12/27: 0.11 m/s 11/04/22: .056 m/s  9/10: 076 m/s 10/22: 0.64 m/s  Goal status: ONGOING   4.  Pt will increase hip flexor and abductor  gross strength to 4+/5 bilat as to improve functional strength for independent gait, increased standing tolerance, and increased ADL ability. Baseline: 10/01/22 4+/5, exception hip flexors and abductors 4/5 each bilat  (previously 03/27/22: 4+/5 ) 9/10: Hip flexor 4/5, hip abductor 4+/5 10/22: hip flexor 4/5 hip abductor 4+/5 Goal status: ONGOING  5.  Pt will ambulate 150 ft with BRW and WC follow with no rest breaks to allow for improved mobility within home and increased independence. Baseline: 10/03/22 150' with BRW 04/03/22: 105 ft with BRW 7/29: 100 in 9:30 with pediatric RW 9/10:101 ft with BRW stooped posture worsened throughout test 12/24/2022- 150 feet using BRW, CGA with w/c follow- in 10 min 17 sec. Will keep goal to make sure he can do this consistently 10/22: 119 ft with BRW 8 min 23 seconds terminated by patient  Goal status: ONGOING    ASSESSMENT :  CLINICAL IMPRESSION:  Patient arrived with good motivation form completion of pt activities.  Pt challenged with high reps of STS wihtout UE assist form elevated plinth today. Pt also challenged with gluteal strength and endurance on nustep today. Pt will continue to benefit from skilled physical therapy intervention to address impairments, improve QOL, and attain therapy goals.     OBJECTIVE IMPAIRMENTS Abnormal gait, decreased activity tolerance, decreased balance, decreased coordination, decreased endurance, decreased mobility, difficulty walking, decreased ROM, decreased strength, improper body mechanics, postural dysfunction, and obesity.   ACTIVITY LIMITATIONS carrying, lifting, bending, sitting, standing, squatting, stairs, transfers, bed mobility, continence, bathing, toileting, dressing, hygiene/grooming, and locomotion level  PARTICIPATION LIMITATIONS: meal prep, cleaning,  laundry, interpersonal relationship, driving, shopping, community activity, and  yard work  PERSONAL [place], Past/current experiences, Time since onset of injury/illness/exacerbation, and 3+ comorbidities: hx of brain tumor, past CVA, HTN, obesity, learning disability, DMII  are also affecting patient's functional outcome.   REHAB POTENTIAL: Fair    CLINICAL DECISION MAKING: Stable/uncomplicated  EVALUATION COMPLEXITY: Low  PLAN: PT FREQUENCY: 1-2x/week  PT DURATION: 12 weeks  PLANNED INTERVENTIONS: Therapeutic exercises, Therapeutic activity, Neuromuscular re-education, Balance training, Gait training, Patient/Family education, Joint mobilization, Stair training, Vestibular training, Orthotic/Fit training, DME instructions, Dry Needling, Electrical stimulation, Wheelchair mobility training, Cryotherapy, Moist heat, Compression bandaging, Manual therapy, and Re-evaluation  PLAN FOR NEXT SESSION:  As much as possible / is tolerated have patient stand with good posture during his rest breaks. continue with strengthening exercises of the LE's,  functional ambulation when appropriate.     Norman Herrlich PT Physical Therapist- [facility]  [facility]   9:52 AM 01/31/23

## 2023-02-04 ENCOUNTER — Encounter: Payer: Self-pay | Admitting: Physical Therapy

## 2023-02-04 ENCOUNTER — Ambulatory Visit: Payer: 59 | Admitting: Physical Therapy

## 2023-02-04 DIAGNOSIS — R262 Difficulty in walking, not elsewhere classified: Secondary | ICD-10-CM

## 2023-02-04 DIAGNOSIS — R269 Unspecified abnormalities of gait and mobility: Secondary | ICD-10-CM

## 2023-02-04 DIAGNOSIS — R2681 Unsteadiness on feet: Secondary | ICD-10-CM

## 2023-02-04 DIAGNOSIS — M6281 Muscle weakness (generalized): Secondary | ICD-10-CM

## 2023-02-04 DIAGNOSIS — R2689 Other abnormalities of gait and mobility: Secondary | ICD-10-CM

## 2023-02-04 NOTE — Therapy (Addendum)
OUTPATIENT PHYSICAL THERAPY NEURO TREATMENT      Patient Name: Curtis Powell MRN: [id number] DOB:Sep 07, 1965, 57 y.o., male Today's Date: 02/04/2023  PCP: Enid Baas, MD  REFERRING PROVIDER: Enid Baas, MD    PT End of Session - 02/04/23 1327     Visit Number 32    Number of Visits 46    Date for PT Re-Evaluation 03/18/23    Progress Note Due on Visit 40    PT Start Time 1325    PT Stop Time 1358    PT Time Calculation (min) 33 min    Equipment Utilized During Treatment Gait belt    Activity Tolerance Patient tolerated treatment well;No increased pain    Behavior During Therapy WFL for tasks assessed/performed                             Past Medical History:  Diagnosis Date   Asthma    GERD (gastroesophageal reflux disease)    Hyperlipidemia    Hypertension    Obesity    Stroke [facility])    Past Surgical History:  Procedure Laterality Date   BRAIN SURGERY     Patient Active Problem List   Diagnosis Date Noted   Impaired glucose tolerance 03/23/2020   Learning disability 03/23/2020   Urinary and fecal incontinence 06/14/2019   Chronic pain syndrome 03/23/2019   Constipation 03/23/2019   Dysarthria 03/23/2019   Dysphagia 03/23/2019   Learning difficulty 03/23/2019   Morbid obesity (HCC) 03/23/2019   Muscle weakness 03/23/2019   Vitamin D deficiency 03/23/2019   Bilateral carotid artery stenosis 12/15/2018   Moderate aortic valve stenosis 12/15/2018   BMI 45.0-49.9, adult (HCC) 11/10/2018   Cerebrovascular accident (HCC) 05/02/2016   Hemiparesis affecting right side as late effect of cerebrovascular accident (HCC) 05/01/2016   Brain tumor (HCC) 11/24/2014   Gastro-esophageal reflux disease without esophagitis 11/24/2014   Accident due to mechanical fall without injury 08/03/2014   Essential hypertension 06/13/2014   Mixed hyperlipidemia 06/13/2014   Unspecified sequelae of cerebral infarction 06/13/2014   Thyromegaly  01/07/2014   Type 2 diabetes mellitus without complication (HCC) 01/06/2014   Neck mass 09/08/2013   Swimmer's ear 09/08/2013   Erectile dysfunction 01/03/2013   Prediabetes 01/03/2013   ONSET DATE: 05/09/2016  REFERRING DIAG: N56.213 (ICD-10-CM) - Hemiplegia and hemiparesis following cerebral infarction affecting right dominant side   THERAPY DIAG:  Unsteadiness on feet  Repeated falls  Hemiplegia and hemiparesis following cerebral infarction affecting right dominant side (HCC)  Difficulty in walking, not elsewhere classified  Acute CVA (cerebrovascular accident) (HCC)  Other abnormalities of gait and mobility  Abnormality of gait and mobility  Muscle weakness (generalized)  Other lack of coordination  Left-sided weakness  Rationale for Evaluation and Treatment Rehabilitation  SUBJECTIVE:  SUBJECTIVE STATEMENT Pt stated that he has not experienced any health changes or falls since his last visit. Pt reported 0/10 on NPS.    Pt accompanied by: self/  PERTINENT HISTORY:  The pt is a pleasant 57 yo male who returns to PT for evaluation of deficits s/o old CVA. Pt with hx of CVA Feb 2018. He was previously fully independent, but CVA caused significant deficits, mainly affecting his right side. Pt reports continued difficulty with walking and transfers. Pt still using WC for majority of mobility, otherwise he uses his RW. Pt lives with his mom who is his main caregiver. Patient had outpatient PT previously in clinic in 2023. He reports since discharge he has been completing his HEP and walking about every day. Pt would like to improve his walking mainly, but also would like to work on balance. He also reports difficulty with transfers. Pt's mom and his caregiver helps him with ADLs. However, he  says he is now waiting for a new caregiver. Prior to this pt had caregiver 3x/week. PMH significant for hx of brain tumor, past CVA, DMII and HTN. See chart for additional information.  PAIN:  Are you having pain? Pt reports no pain currently  PRECAUTIONS: fall  WEIGHT BEARING RESTRICTIONS none  PLOF: required assistance with ADLs (old CVA), prior to CVA in 2018 was indep.  History of Falls: Pt reports no recent falls  Home information: Pt lives with mother who is his primary caregiver. Previously had home aid, 3x/week but reports currently waiting for new one.  PATIENT GOALS: Pt would like to improve his ability to ambulate.  OBJECTIVE:  taken at [facility] unless otherwise noted:   DIAGNOSTIC FINDINGS: No recent imaging per chart  Objective measures taken at eval unless otherwise specified:   Posture: forward head posture, rounded shoulders  MMT LE: grossly  4+/5, exception hip flexors and abductors 4/5 each bilat   Functional tests:  5xSTS:  34 sec, use of BUE (multiple initial attempts with posterior LOB in chair)   : 0.096 m/s with RW, close CGA, and WC follow  Gait for distance (goal 150 ft): deferred due to time  Gait mechanics: impaired, decreased speed (see ), crouched posture, poor postural stability and relies heavily on BUE support on RW. Pt now with bilat AFOS   FOTO: 44 (goal 50)    TODAY'S TREATMENT  02/04/23  TA  STS then ambulates to mat table x 10 ft with B RW   STS with no UE support from elevated plinth 1 x 8 reps  STS with no UE support with plinth at average height 1 x 7 reps, cues to lean nose over toes, added 2KG ball for improved ant weight shift   TE  Ambulate to nustep with RW, hunched posture, heavy reliance on RW   Nustep LE only  Level 2 x 3 min focus on maintaining SPM throughout  Level 6 x 1 min (2 rounds) focus on LE strength   Unless otherwise stated, CGA was provided and gait belt donned in order to ensure pt  safety  PATIENT EDUCATION: Education details: Pt educated throughout session about proper posture and technique with exercises. Improved exercise technique, movement at target joints, use of target muscles after min to mod verbal, visual, tactile cues.   Person educated: Patient Education method: Explanation, Demonstration, Tactile cues, and Verbal cues Education comprehension: verbalized understanding, returned demonstration, and verbal cues required  HOME EXERCISE PROGRAM:  Reviewed HEP in session but unable to issue  printout today due to printer error - will provide printout next visit  Access Code: [email or web address] URL: [email or web address] Date: 10/01/2022 Prepared by: Temple Pacini  Exercises - Seated March  - 1 x daily - 6 x weekly - 3 sets - 10 reps - Seated Hip Abduction  - 1 x daily - 6 x weekly - 2 sets - 10 reps  GOALS: Goals reviewed with patient? No  SHORT TERM GOALS: Target date: 11/12/2022  Pt will be independent with HEP in order to improve strength and balance in order to decrease fall risk and improve function at home. Baseline: instructed in HEP today, to provide printout next visit Goal status: MET    LONG TERM GOALS: Target date: 03/18/2023  Pt will increase FOTO to at least 50 in order to demonstrate improvement in function related to mobility and QoL. Baseline: 44 7/29:48 9/10:40 12/24/2022= 51 Goal status: MET  2.  Pt (< 60 yrs old) will complete 5xSTS test < 30 seconds indicating an increase in LE strength and improved balance. Baseline: 10/01/22 34 sec (multiple initial attempts with posterior LOB in chair) 12/20: 49 seconds 11/04/22:1:06, min A on multiple attempts to maintain balance and pt stays in flexed posture throughout test when coming to standing.   9/10: 27.5 sec, Pushes WC post consistently, close CGA  10/22: 47 seconds first attempt Goal status: ONGOING  3.  Pt will increase to > 0.5 m/s (30 sec) as to improve gait speed for  better home ambulation and reduce fall risk. Baseline: 10/01/22 0.096 m/s with RW, close CGA, and WC follow previously 12/27: 0.11 m/s 11/04/22: .056 m/s  9/10: 076 m/s 10/22: 0.64 m/s  Goal status: ONGOING   4.  Pt will increase hip flexor and abductor  gross strength to 4+/5 bilat as to improve functional strength for independent gait, increased standing tolerance, and increased ADL ability. Baseline: 10/01/22 4+/5, exception hip flexors and abductors 4/5 each bilat  (previously 03/27/22: 4+/5 ) 9/10: Hip flexor 4/5, hip abductor 4+/5 10/22: hip flexor 4/5 hip abductor 4+/5 Goal status: ONGOING  5.  Pt will ambulate 150 ft with BRW and WC follow with no rest breaks to allow for improved mobility within home and increased independence. Baseline: 10/03/22 150' with BRW 04/03/22: 105 ft with BRW 7/29: 100 in 9:30 with pediatric RW 9/10:101 ft with BRW stooped posture worsened throughout test 12/24/2022- 150 feet using BRW, CGA with w/c follow- in 10 min 17 sec. Will keep goal to make sure he can do this consistently 10/22: 119 ft with BRW 8 min 23 seconds terminated by patient  Goal status: ONGOING    ASSESSMENT :  CLINICAL IMPRESSION:  Pt responded well to treatment today. Pt was able to tolerate a higher level on the Nustep to increase LE strength and was able to complete more STS on a plinth that was not elevated. Pt was cued during STS to lean his nose over his toes when coming into standing. Heavy weight shift onto his heels causing erect posture to be unsustainable.  Pt will continue to benefit from skilled physical therapy intervention to address impairments, improve QOL, and attain therapy goals.   OBJECTIVE IMPAIRMENTS Abnormal gait, decreased activity tolerance, decreased balance, decreased coordination, decreased endurance, decreased mobility, difficulty walking, decreased ROM, decreased strength, improper body mechanics, postural dysfunction, and obesity.   ACTIVITY LIMITATIONS  carrying, lifting, bending, sitting, standing, squatting, stairs, transfers, bed mobility, continence, bathing, toileting, dressing, hygiene/grooming, and locomotion level  PARTICIPATION LIMITATIONS: meal prep, cleaning,  laundry, interpersonal relationship, driving, shopping, community activity, and yard work  PERSONAL FACTORS Fitness, Past/current experiences, Time since onset of injury/illness/exacerbation, and 3+ comorbidities: hx of brain tumor, past CVA, HTN, obesity, learning disability, DMII  are also affecting patient's functional outcome.   REHAB POTENTIAL: Fair    CLINICAL DECISION MAKING: Stable/uncomplicated  EVALUATION COMPLEXITY: Low  PLAN: PT FREQUENCY: 1-2x/week  PT DURATION: 12 weeks  PLANNED INTERVENTIONS: Therapeutic exercises, Therapeutic activity, Neuromuscular re-education, Balance training, Gait training, Patient/Family education, Joint mobilization, Stair training, Vestibular training, Orthotic/Fit training, DME instructions, Dry Needling, Electrical stimulation, Wheelchair mobility training, Cryotherapy, Moist heat, Compression bandaging, Manual therapy, and Re-evaluation  PLAN FOR NEXT SESSION:  As much as possible / is tolerated have patient stand with good posture during his rest breaks. continue with strengthening exercises of the LE's, Functional ambulation when appropriate.   Norman Herrlich PT Physical Therapist- [place]  [facility]   2:04 PM 02/04/23   This entire session was performed under direct supervision and direction of a licensed therapist/therapist assistant . I have personally read, edited and approve of the note as written.    This licensed clinician was present and actively directing care throughout the session at all times.  Norman Herrlich PT ,DPT Physical Therapist- [place]  [facility]

## 2023-02-06 ENCOUNTER — Encounter: Payer: Self-pay | Admitting: Physical Therapy

## 2023-02-06 ENCOUNTER — Ambulatory Visit: Payer: 59 | Admitting: Physical Therapy

## 2023-02-06 DIAGNOSIS — R269 Unspecified abnormalities of gait and mobility: Secondary | ICD-10-CM

## 2023-02-06 DIAGNOSIS — R2681 Unsteadiness on feet: Secondary | ICD-10-CM

## 2023-02-06 DIAGNOSIS — R262 Difficulty in walking, not elsewhere classified: Secondary | ICD-10-CM

## 2023-02-06 DIAGNOSIS — M6281 Muscle weakness (generalized): Secondary | ICD-10-CM

## 2023-02-06 DIAGNOSIS — R2689 Other abnormalities of gait and mobility: Secondary | ICD-10-CM

## 2023-02-06 NOTE — Therapy (Signed)
OUTPATIENT PHYSICAL THERAPY NEURO TREATMENT      Patient Name: Curtis Powell MRN: [id number] DOB:Nov 09, 1965, 57 y.o., male Today's Date: 02/06/2023  PCP: Enid Baas, MD  REFERRING PROVIDER: Enid Baas, MD    PT End of Session - 02/06/23 1323     Visit Number 33    Number of Visits 46    Date for PT Re-Evaluation 03/18/23    Progress Note Due on Visit 40    PT Start Time 1317    PT Stop Time 1358    PT Time Calculation (min) 41 min    Equipment Utilized During Treatment Gait belt    Activity Tolerance Patient tolerated treatment well;No increased pain    Behavior During Therapy WFL for tasks assessed/performed                             Past Medical History:  Diagnosis Date   Asthma    GERD (gastroesophageal reflux disease)    Hyperlipidemia    Hypertension    Obesity    Stroke [facility])    Past Surgical History:  Procedure Laterality Date   BRAIN SURGERY     Patient Active Problem List   Diagnosis Date Noted   Impaired glucose tolerance 03/23/2020   Learning disability 03/23/2020   Urinary and fecal incontinence 06/14/2019   Chronic pain syndrome 03/23/2019   Constipation 03/23/2019   Dysarthria 03/23/2019   Dysphagia 03/23/2019   Learning difficulty 03/23/2019   Morbid obesity (HCC) 03/23/2019   Muscle weakness 03/23/2019   Vitamin D deficiency 03/23/2019   Bilateral carotid artery stenosis 12/15/2018   Moderate aortic valve stenosis 12/15/2018   BMI 45.0-49.9, adult (HCC) 11/10/2018   Cerebrovascular accident (HCC) 05/02/2016   Hemiparesis affecting right side as late effect of cerebrovascular accident (HCC) 05/01/2016   Brain tumor (HCC) 11/24/2014   Gastro-esophageal reflux disease without esophagitis 11/24/2014   Accident due to mechanical fall without injury 08/03/2014   Essential hypertension 06/13/2014   Mixed hyperlipidemia 06/13/2014   Unspecified sequelae of cerebral infarction 06/13/2014   Thyromegaly  01/07/2014   Type 2 diabetes mellitus without complication (HCC) 01/06/2014   Neck mass 09/08/2013   Swimmer's ear 09/08/2013   Erectile dysfunction 01/03/2013   Prediabetes 01/03/2013   ONSET DATE: 05/09/2016  REFERRING DIAG: P32.951 (ICD-10-CM) - Hemiplegia and hemiparesis following cerebral infarction affecting right dominant side   THERAPY DIAG:  Unsteadiness on feet  Difficulty in walking, not elsewhere classified  Other abnormalities of gait and mobility  Abnormality of gait and mobility  Muscle weakness (generalized)  Rationale for Evaluation and Treatment Rehabilitation  SUBJECTIVE:  SUBJECTIVE STATEMENT  Pt stated that he has not experienced any health changes or falls since his last visit. No pain reported.    Pt accompanied by: self/  PERTINENT HISTORY:  The pt is a pleasant 57 yo male who returns to PT for evaluation of deficits s/o old CVA. Pt with hx of CVA Feb 2018. He was previously fully independent, but CVA caused significant deficits, mainly affecting his right side. Pt reports continued difficulty with walking and transfers. Pt still using WC for majority of mobility, otherwise he uses his RW. Pt lives with his mom who is his main caregiver. Patient had outpatient PT previously in clinic in 2023. He reports since discharge he has been completing his HEP and walking about every day. Pt would like to improve his walking mainly, but also would like to work on balance. He also reports difficulty with transfers. Pt's mom and his caregiver helps him with ADLs. However, he says he is now waiting for a new caregiver. Prior to this pt had caregiver 3x/week. PMH significant for hx of brain tumor, past CVA, DMII and HTN. See chart for additional information.  PAIN:  Are you having pain?  Pt reports no pain currently  PRECAUTIONS: fall  WEIGHT BEARING RESTRICTIONS none  PLOF: required assistance with ADLs (old CVA), prior to CVA in 2018 was indep.  History of Falls: Pt reports no recent falls  Home information: Pt lives with mother who is his primary caregiver. Previously had home aid, 3x/week but reports currently waiting for new one.  PATIENT GOALS: Pt would like to improve his ability to ambulate.  OBJECTIVE:  taken at [facility] unless otherwise noted:   DIAGNOSTIC FINDINGS: No recent imaging per chart  Objective measures taken at eval unless otherwise specified:   Posture: forward head posture, rounded shoulders  MMT LE: grossly  4+/5, exception hip flexors and abductors 4/5 each bilat   Functional tests:  5xSTS:  34 sec, use of BUE (multiple initial attempts with posterior LOB in chair)   : 0.096 m/s with RW, close CGA, and WC follow  Gait for distance (goal 150 ft): deferred due to time  Gait mechanics: impaired, decreased speed (see ), crouched posture, poor postural stability and relies heavily on BUE support on RW. Pt now with bilat AFOS   FOTO: 44 (goal 50)    TODAY'S TREATMENT  02/06/23  TA  STS then ambulates to mat table x 10 ft with B RW   STS with no UE support from elevated plinth 4 x 5 reps, cues for ant weight shift and to prevent using post LE on mat table for support   Ambulate to nustep with RW, hunched posture, heavy reliance on RW   TE   Nustep LE only  Level 1 x 4 min focus on maintaining SPM at highest speed throughout ( 40-45 BPM maintained) Level 6 x 1 min (2 rounds) focus on LE strength   In WC hamstring curls moving WC forward x 10 ft then propelling WC post x to ft with knee extensions.   In // bars 2 x 45 sec hip flexor stretch in standing  Unless otherwise stated, CGA was provided and gait belt donned in order to ensure pt safety  PATIENT EDUCATION: Education details: Pt educated throughout session  about proper posture and technique with exercises. Improved exercise technique, movement at target joints, use of target muscles after min to mod verbal, visual, tactile cues.   Person educated: Patient Education method: Explanation, Demonstration,  Tactile cues, and Verbal cues Education comprehension: verbalized understanding, returned demonstration, and verbal cues required  HOME EXERCISE PROGRAM:  Reviewed HEP in session but unable to issue printout today due to printer error - will provide printout next visit  Access Code: [email or web address] URL: [email or web address] Date: 10/01/2022 Prepared by: Temple Pacini  Exercises - Seated March  - 1 x daily - 6 x weekly - 3 sets - 10 reps - Seated Hip Abduction  - 1 x daily - 6 x weekly - 2 sets - 10 reps  GOALS: Goals reviewed with patient? No  SHORT TERM GOALS: Target date: 11/12/2022  Pt will be independent with HEP in order to improve strength and balance in order to decrease fall risk and improve function at home. Baseline: instructed in HEP today, to provide printout next visit Goal status: MET    LONG TERM GOALS: Target date: 03/18/2023  Pt will increase FOTO to at least 50 in order to demonstrate improvement in function related to mobility and QoL. Baseline: 44 7/29:48 9/10:40 12/24/2022= 51 Goal status: MET  2.  Pt (< 60 yrs old) will complete 5xSTS test < 30 seconds indicating an increase in LE strength and improved balance. Baseline: 10/01/22 34 sec (multiple initial attempts with posterior LOB in chair) 12/20: 49 seconds 11/04/22:1:06, min A on multiple attempts to maintain balance and pt stays in flexed posture throughout test when coming to standing.   9/10: 27.5 sec, Pushes WC post consistently, close CGA  10/22: 47 seconds first attempt Goal status: ONGOING  3.  Pt will increase to > 0.5 m/s (30 sec) as to improve gait speed for better home ambulation and reduce fall risk. Baseline: 10/01/22 0.096 m/s with  RW, close CGA, and WC follow previously 12/27: 0.11 m/s 11/04/22: .056 m/s  9/10: 076 m/s 10/22: 0.64 m/s  Goal status: ONGOING   4.  Pt will increase hip flexor and abductor  gross strength to 4+/5 bilat as to improve functional strength for independent gait, increased standing tolerance, and increased ADL ability. Baseline: 10/01/22 4+/5, exception hip flexors and abductors 4/5 each bilat  (previously 03/27/22: 4+/5 ) 9/10: Hip flexor 4/5, hip abductor 4+/5 10/22: hip flexor 4/5 hip abductor 4+/5 Goal status: ONGOING  5.  Pt will ambulate 150 ft with BRW and WC follow with no rest breaks to allow for improved mobility within home and increased independence. Baseline: 10/03/22 150' with BRW 04/03/22: 105 ft with BRW 7/29: 100 in 9:30 with pediatric RW 9/10:101 ft with BRW stooped posture worsened throughout test 12/24/2022- 150 feet using BRW, CGA with w/c follow- in 10 min 17 sec. Will keep goal to make sure he can do this consistently 10/22: 119 ft with BRW 8 min 23 seconds terminated by patient  Goal status: ONGOING    ASSESSMENT :  CLINICAL IMPRESSION:  Patient arrived with good motivation form completion of pt activities.  Pt shows good progress with STS activities today from elevated plinth table but still relies on table for support with balance. Pt also abel to improve erect posture with hip flexor stretch in // bars with good efficacy. Pt will continue to benefit from skilled physical therapy intervention to address impairments, improve QOL, and attain therapy goals.   OBJECTIVE IMPAIRMENTS Abnormal gait, decreased activity tolerance, decreased balance, decreased coordination, decreased endurance, decreased mobility, difficulty walking, decreased ROM, decreased strength, improper body mechanics, postural dysfunction, and obesity.   ACTIVITY LIMITATIONS carrying, lifting, bending, sitting, standing, squatting, stairs, transfers, bed mobility, continence, bathing,  toileting, dressing,  hygiene/grooming, and locomotion level  PARTICIPATION LIMITATIONS: meal prep, cleaning, laundry, interpersonal relationship, driving, shopping, community activity, and yard work  PERSONAL FACTORS Fitness, Past/current experiences, Time since onset of injury/illness/exacerbation, and 3+ comorbidities: hx of brain tumor, past CVA, HTN, obesity, learning disability, DMII  are also affecting patient's functional outcome.   REHAB POTENTIAL: Fair    CLINICAL DECISION MAKING: Stable/uncomplicated  EVALUATION COMPLEXITY: Low  PLAN: PT FREQUENCY: 1-2x/week  PT DURATION: 12 weeks  PLANNED INTERVENTIONS: Therapeutic exercises, Therapeutic activity, Neuromuscular re-education, Balance training, Gait training, Patient/Family education, Joint mobilization, Stair training, Vestibular training, Orthotic/Fit training, DME instructions, Dry Needling, Electrical stimulation, Wheelchair mobility training, Cryotherapy, Moist heat, Compression bandaging, Manual therapy, and Re-evaluation  PLAN FOR NEXT SESSION:  As much as possible / is tolerated have patient stand with good posture during his rest breaks. continue with strengthening exercises of the LE's, Functional ambulation when appropriate.    Norman Herrlich PT ,DPT Physical Therapist- [place]  [facility]

## 2023-02-11 ENCOUNTER — Ambulatory Visit: Payer: 59 | Attending: Internal Medicine | Admitting: Physical Therapy

## 2023-02-11 DIAGNOSIS — R262 Difficulty in walking, not elsewhere classified: Secondary | ICD-10-CM | POA: Diagnosis present

## 2023-02-11 DIAGNOSIS — R269 Unspecified abnormalities of gait and mobility: Secondary | ICD-10-CM | POA: Diagnosis present

## 2023-02-11 DIAGNOSIS — R531 Weakness: Secondary | ICD-10-CM | POA: Insufficient documentation

## 2023-02-11 DIAGNOSIS — R296 Repeated falls: Secondary | ICD-10-CM | POA: Diagnosis present

## 2023-02-11 DIAGNOSIS — M6281 Muscle weakness (generalized): Secondary | ICD-10-CM | POA: Insufficient documentation

## 2023-02-11 DIAGNOSIS — R2681 Unsteadiness on feet: Secondary | ICD-10-CM | POA: Insufficient documentation

## 2023-02-11 DIAGNOSIS — R278 Other lack of coordination: Secondary | ICD-10-CM | POA: Insufficient documentation

## 2023-02-11 DIAGNOSIS — R2689 Other abnormalities of gait and mobility: Secondary | ICD-10-CM | POA: Insufficient documentation

## 2023-02-11 DIAGNOSIS — I69351 Hemiplegia and hemiparesis following cerebral infarction affecting right dominant side: Secondary | ICD-10-CM | POA: Diagnosis present

## 2023-02-11 NOTE — Therapy (Signed)
OUTPATIENT PHYSICAL THERAPY NEURO TREATMENT      Patient Name: Curtis Powell MRN: [id number] DOB:1965-11-25, 57 y.o., male Today's Date: 02/11/2023  PCP: Enid Baas, MD  REFERRING PROVIDER: Enid Baas, MD    PT End of Session - 02/11/23 1157     Visit Number 34    Number of Visits 46    Date for PT Re-Evaluation 03/18/23    Progress Note Due on Visit 40    PT Start Time 1139    PT Stop Time 1222    PT Time Calculation (min) 43 min    Equipment Utilized During Treatment Gait belt    Activity Tolerance Patient tolerated treatment well;No increased pain    Behavior During Therapy WFL for tasks assessed/performed                              Past Medical History:  Diagnosis Date   Asthma    GERD (gastroesophageal reflux disease)    Hyperlipidemia    Hypertension    Obesity    Stroke [facility])    Past Surgical History:  Procedure Laterality Date   BRAIN SURGERY     Patient Active Problem List   Diagnosis Date Noted   Impaired glucose tolerance 03/23/2020   Learning disability 03/23/2020   Urinary and fecal incontinence 06/14/2019   Chronic pain syndrome 03/23/2019   Constipation 03/23/2019   Dysarthria 03/23/2019   Dysphagia 03/23/2019   Learning difficulty 03/23/2019   Morbid obesity (HCC) 03/23/2019   Muscle weakness 03/23/2019   Vitamin D deficiency 03/23/2019   Bilateral carotid artery stenosis 12/15/2018   Moderate aortic valve stenosis 12/15/2018   BMI 45.0-49.9, adult (HCC) 11/10/2018   Cerebrovascular accident (HCC) 05/02/2016   Hemiparesis affecting right side as late effect of cerebrovascular accident (HCC) 05/01/2016   Brain tumor (HCC) 11/24/2014   Gastro-esophageal reflux disease without esophagitis 11/24/2014   Accident due to mechanical fall without injury 08/03/2014   Essential hypertension 06/13/2014   Mixed hyperlipidemia 06/13/2014   Unspecified sequelae of cerebral infarction 06/13/2014   Thyromegaly  01/07/2014   Type 2 diabetes mellitus without complication (HCC) 01/06/2014   Neck mass 09/08/2013   Swimmer's ear 09/08/2013   Erectile dysfunction 01/03/2013   Prediabetes 01/03/2013   ONSET DATE: 05/09/2016  REFERRING DIAG: O96.295 (ICD-10-CM) - Hemiplegia and hemiparesis following cerebral infarction affecting right dominant side   THERAPY DIAG:  Unsteadiness on feet  Difficulty in walking, not elsewhere classified  Other abnormalities of gait and mobility  Abnormality of gait and mobility  Muscle weakness (generalized)  Rationale for Evaluation and Treatment Rehabilitation  SUBJECTIVE:  SUBJECTIVE STATEMENT  Pt stated that he has not experienced any health changes or falls since his last visit. No pain reported. His mom/ caregiver is provided with some home exercises to work on him with including standing with good posture and hip flexor stretching.    Pt accompanied by: self/  PERTINENT HISTORY:   The pt is a pleasant 57 yo male who returns to PT for evaluation of deficits s/o old CVA. Pt with hx of CVA Feb 2018. He was previously fully independent, but CVA caused significant deficits, mainly affecting his right side. Pt reports continued difficulty with walking and transfers. Pt still using WC for majority of mobility, otherwise he uses his RW. Pt lives with his mom who is his main caregiver. Patient had outpatient PT previously in clinic in 2023. He reports since discharge he has been completing his HEP and walking about every day. Pt would like to improve his walking mainly, but also would like to work on balance. He also reports difficulty with transfers. Pt's mom and his caregiver helps him with ADLs. However, he says he is now waiting for a new caregiver. Prior to this pt had caregiver  3x/week. PMH significant for hx of brain tumor, past CVA, DMII and HTN. See chart for additional information.  PAIN:  Are you having pain? Pt reports no pain currently  PRECAUTIONS: fall  WEIGHT BEARING RESTRICTIONS none  PLOF: required assistance with ADLs (old CVA), prior to CVA in 2018 was indep.  History of Falls: Pt reports no recent falls  Home information: Pt lives with mother who is his primary caregiver. Previously had home aid, 3x/week but reports currently waiting for new one.  PATIENT GOALS: Pt would like to improve his ability to ambulate.  OBJECTIVE:  taken at [facility] unless otherwise noted:   DIAGNOSTIC FINDINGS: No recent imaging per chart  Objective measures taken at eval unless otherwise specified:   Posture: forward head posture, rounded shoulders  MMT LE: grossly  4+/5, exception hip flexors and abductors 4/5 each bilat   Functional tests:  5xSTS:  34 sec, use of BUE (multiple initial attempts with posterior LOB in chair)   : 0.096 m/s with RW, close CGA, and WC follow  Gait for distance (goal 150 ft): deferred due to time  Gait mechanics: impaired, decreased speed (see ), crouched posture, poor postural stability and relies heavily on BUE support on RW. Pt now with bilat AFOS   FOTO: 44 (goal 50)    TODAY'S TREATMENT  02/11/23  TE  Nustep LE only  Level 1 x 5 min focus on maintaining SPM at highest speed throughout ( 45-50 SPM maintained) Level 7 x 1 min (2 rounds) focus on LE strength   TA  STS then ambulates to mat table x 10 ft with B RW   STS with no UE support from elevated plinth 3 x 5 reps, cues for ant weight shift and to prevent using post LE on mat table for support  -tactile cues at the post hip for improved glute activation for posture  -Pt reports feeling fatigue in his gluteal region showing good activation of target muscle group  Unless otherwise stated, CGA was provided and gait belt donned in order to ensure pt  safety  PATIENT EDUCATION: Education details: Pt educated throughout session about proper posture and technique with exercises. Improved exercise technique, movement at target joints, use of target muscles after min to mod verbal, visual, tactile cues.   Person educated: Patient Education  method: Explanation, Demonstration, Tactile cues, and Verbal cues Education comprehension: verbalized understanding, returned demonstration, and verbal cues required  HOME EXERCISE PROGRAM:  Standing hip flexor stretch at counter, prolonged standing with erect posture for gluteal activation, HS curls with RTB   GOALS: Goals reviewed with patient? No  SHORT TERM GOALS: Target date: 11/12/2022  Pt will be independent with HEP in order to improve strength and balance in order to decrease fall risk and improve function at home. Baseline: instructed in HEP today, to provide printout next visit Goal status: MET    LONG TERM GOALS: Target date: 03/18/2023  Pt will increase FOTO to at least 50 in order to demonstrate improvement in function related to mobility and QoL. Baseline: 44 7/29:48 9/10:40 12/24/2022= 51 Goal status: MET  2.  Pt (< 60 yrs old) will complete 5xSTS test < 30 seconds indicating an increase in LE strength and improved balance. Baseline: 10/01/22 34 sec (multiple initial attempts with posterior LOB in chair) 12/20: 49 seconds 11/04/22:1:06, min A on multiple attempts to maintain balance and pt stays in flexed posture throughout test when coming to standing.   9/10: 27.5 sec, Pushes WC post consistently, close CGA  10/22: 47 seconds first attempt Goal status: ONGOING  3.  Pt will increase to > 0.5 m/s (30 sec) as to improve gait speed for better home ambulation and reduce fall risk. Baseline: 10/01/22 0.096 m/s with RW, close CGA, and WC follow previously 12/27: 0.11 m/s 11/04/22: .056 m/s  9/10: 076 m/s 10/22: 0.64 m/s  Goal status: ONGOING   4.  Pt will increase hip flexor and  abductor  gross strength to 4+/5 bilat as to improve functional strength for independent gait, increased standing tolerance, and increased ADL ability. Baseline: 10/01/22 4+/5, exception hip flexors and abductors 4/5 each bilat  (previously 03/27/22: 4+/5 ) 9/10: Hip flexor 4/5, hip abductor 4+/5 10/22: hip flexor 4/5 hip abductor 4+/5 Goal status: ONGOING  5.  Pt will ambulate 150 ft with BRW and WC follow with no rest breaks to allow for improved mobility within home and increased independence. Baseline: 10/03/22 150' with BRW 04/03/22: 105 ft with BRW 7/29: 100 in 9:30 with pediatric RW 9/10:101 ft with BRW stooped posture worsened throughout test 12/24/2022- 150 feet using BRW, CGA with w/c follow- in 10 min 17 sec. Will keep goal to make sure he can do this consistently 10/22: 119 ft with BRW 8 min 23 seconds terminated by patient  Goal status: ONGOING    ASSESSMENT :  CLINICAL IMPRESSION:  Patient arrived with good motivation form completion of pt activities.  Pt shows good progress with STS activities today from elevated plinth table responding well to cues for forward weight shift and gluteal activation (tactile cues).  Pt also abel to improve erect posture with hip flexor stretch in // bars with good efficacy. Pt will continue to benefit from skilled physical therapy intervention to address impairments, improve QOL, and attain therapy goals.   OBJECTIVE IMPAIRMENTS Abnormal gait, decreased activity tolerance, decreased balance, decreased coordination, decreased endurance, decreased mobility, difficulty walking, decreased ROM, decreased strength, improper body mechanics, postural dysfunction, and obesity.   ACTIVITY LIMITATIONS carrying, lifting, bending, sitting, standing, squatting, stairs, transfers, bed mobility, continence, bathing, toileting, dressing, hygiene/grooming, and locomotion level  PARTICIPATION LIMITATIONS: meal prep, cleaning, laundry, interpersonal relationship,  driving, shopping, community activity, and yard work  PERSONAL FACTORS Fitness, Past/current experiences, Time since onset of injury/illness/exacerbation, and 3+ comorbidities: hx of brain tumor, past CVA, HTN,  obesity, learning disability, DMII  are also affecting patient's functional outcome.   REHAB POTENTIAL: Fair    CLINICAL DECISION MAKING: Stable/uncomplicated  EVALUATION COMPLEXITY: Low  PLAN: PT FREQUENCY: 1-2x/week  PT DURATION: 12 weeks  PLANNED INTERVENTIONS: Therapeutic exercises, Therapeutic activity, Neuromuscular re-education, Balance training, Gait training, Patient/Family education, Joint mobilization, Stair training, Vestibular training, Orthotic/Fit training, DME instructions, Dry Needling, Electrical stimulation, Wheelchair mobility training, Cryotherapy, Moist heat, Compression bandaging, Manual therapy, and Re-evaluation  PLAN FOR NEXT SESSION:  As much as possible / is tolerated have patient stand with good posture during his rest breaks. continue with strengthening exercises of the LE's, Functional ambulation when appropriate.    Norman Herrlich PT ,DPT Physical Therapist- [place]  [facility]

## 2023-02-13 ENCOUNTER — Ambulatory Visit: Payer: 59 | Admitting: Physical Therapy

## 2023-02-13 DIAGNOSIS — R269 Unspecified abnormalities of gait and mobility: Secondary | ICD-10-CM

## 2023-02-13 DIAGNOSIS — R2689 Other abnormalities of gait and mobility: Secondary | ICD-10-CM

## 2023-02-13 DIAGNOSIS — R296 Repeated falls: Secondary | ICD-10-CM

## 2023-02-13 DIAGNOSIS — R531 Weakness: Secondary | ICD-10-CM

## 2023-02-13 DIAGNOSIS — R278 Other lack of coordination: Secondary | ICD-10-CM

## 2023-02-13 DIAGNOSIS — R262 Difficulty in walking, not elsewhere classified: Secondary | ICD-10-CM

## 2023-02-13 DIAGNOSIS — R2681 Unsteadiness on feet: Secondary | ICD-10-CM

## 2023-02-13 DIAGNOSIS — I69351 Hemiplegia and hemiparesis following cerebral infarction affecting right dominant side: Secondary | ICD-10-CM

## 2023-02-13 DIAGNOSIS — M6281 Muscle weakness (generalized): Secondary | ICD-10-CM

## 2023-02-13 NOTE — Therapy (Signed)
OUTPATIENT PHYSICAL THERAPY NEURO TREATMENT      Patient Name: Curtis Powell MRN: [id number] DOB:20-May-1965, 57 y.o., male Today's Date: 02/13/2023  PCP: Enid Baas, MD  REFERRING PROVIDER: Enid Baas, MD    PT End of Session - 02/13/23 1406     Visit Number 35    Number of Visits 46    Date for PT Re-Evaluation 03/18/23    Progress Note Due on Visit 40    PT Start Time 1406    PT Stop Time 1445    PT Time Calculation (min) 39 min    Equipment Utilized During Treatment Gait belt    Activity Tolerance Patient tolerated treatment well;No increased pain    Behavior During Therapy WFL for tasks assessed/performed                              Past Medical History:  Diagnosis Date   Asthma    GERD (gastroesophageal reflux disease)    Hyperlipidemia    Hypertension    Obesity    Stroke [facility])    Past Surgical History:  Procedure Laterality Date   BRAIN SURGERY     Patient Active Problem List   Diagnosis Date Noted   Impaired glucose tolerance 03/23/2020   Learning disability 03/23/2020   Urinary and fecal incontinence 06/14/2019   Chronic pain syndrome 03/23/2019   Constipation 03/23/2019   Dysarthria 03/23/2019   Dysphagia 03/23/2019   Learning difficulty 03/23/2019   Morbid obesity (HCC) 03/23/2019   Muscle weakness 03/23/2019   Vitamin D deficiency 03/23/2019   Bilateral carotid artery stenosis 12/15/2018   Moderate aortic valve stenosis 12/15/2018   BMI 45.0-49.9, adult (HCC) 11/10/2018   Cerebrovascular accident (HCC) 05/02/2016   Hemiparesis affecting right side as late effect of cerebrovascular accident (HCC) 05/01/2016   Brain tumor (HCC) 11/24/2014   Gastro-esophageal reflux disease without esophagitis 11/24/2014   Accident due to mechanical fall without injury 08/03/2014   Essential hypertension 06/13/2014   Mixed hyperlipidemia 06/13/2014   Unspecified sequelae of cerebral infarction 06/13/2014   Thyromegaly  01/07/2014   Type 2 diabetes mellitus without complication (HCC) 01/06/2014   Neck mass 09/08/2013   Swimmer's ear 09/08/2013   Erectile dysfunction 01/03/2013   Prediabetes 01/03/2013   ONSET DATE: 05/09/2016  REFERRING DIAG: X52.841 (ICD-10-CM) - Hemiplegia and hemiparesis following cerebral infarction affecting right dominant side   THERAPY DIAG:  No diagnosis found.  Rationale for Evaluation and Treatment Rehabilitation  SUBJECTIVE:  SUBJECTIVE STATEMENT  Pt stated that he has not experienced any health changes or falls since his last visit. Reports that he is walking at home with mom.   Pt accompanied by: self/  PERTINENT HISTORY:   The pt is a pleasant 57 yo male who returns to PT for evaluation of deficits s/o old CVA. Pt with hx of CVA Feb 2018. He was previously fully independent, but CVA caused significant deficits, mainly affecting his right side. Pt reports continued difficulty with walking and transfers. Pt still using WC for majority of mobility, otherwise he uses his RW. Pt lives with his mom who is his main caregiver. Patient had outpatient PT previously in clinic in 2023. He reports since discharge he has been completing his HEP and walking about every day. Pt would like to improve his walking mainly, but also would like to work on balance. He also reports difficulty with transfers. Pt's mom and his caregiver helps him with ADLs. However, he says he is now waiting for a new caregiver. Prior to this pt had caregiver 3x/week. PMH significant for hx of brain tumor, past CVA, DMII and HTN. See chart for additional information.  PAIN:  Are you having pain? Pt reports no pain currently  PRECAUTIONS: fall  WEIGHT BEARING RESTRICTIONS none  PLOF: required assistance with ADLs (old CVA),  prior to CVA in 2018 was indep.  History of Falls: Pt reports no recent falls  Home information: Pt lives with mother who is his primary caregiver. Previously had home aid, 3x/week but reports currently waiting for new one.  PATIENT GOALS: Pt would like to improve his ability to ambulate.  OBJECTIVE:  taken at [facility] unless otherwise noted:   DIAGNOSTIC FINDINGS: No recent imaging per chart  Objective measures taken at eval unless otherwise specified:   Posture: forward head posture, rounded shoulders  MMT LE: grossly  4+/5, exception hip flexors and abductors 4/5 each bilat   Functional tests:  5xSTS:  34 sec, use of BUE (multiple initial attempts with posterior LOB in chair)   : 0.096 m/s with RW, close CGA, and WC follow  Gait for distance (goal 150 ft): deferred due to time  Gait mechanics: impaired, decreased speed (see ), crouched posture, poor postural stability and relies heavily on BUE support on RW. Pt now with bilat AFOS   FOTO: 44 (goal 50)    TODAY'S TREATMENT  02/13/23  NMR  PT instructed pt in dynamic gait training with baritric RW forward/reverse 2 x 29ft with therapeutic rest break between bouts.  Side stepping L and L 80ft bil with bariatric RW.   Blocked practice Sit<>stand with with 1 UE on RW, 1 UE pushing from arm rest 2 x 5 with prolonged rest break between bouts.  Gait with RW x 93ft with CGA into hall from rehab gym with cues for improved posture and step length as tolerated.   Unless otherwise stated, CGA was provided and gait belt donned in order to ensure pt safety  PATIENT EDUCATION: Education details: Pt educated throughout session about proper posture and technique with exercises. Improved exercise technique, movement at target joints, use of target muscles after min to mod verbal, visual, tactile cues.   Person educated: Patient Education method: Explanation, Demonstration, Tactile cues, and Verbal cues Education comprehension:  verbalized understanding, returned demonstration, and verbal cues required  HOME EXERCISE PROGRAM:  Standing hip flexor stretch at counter, prolonged standing with erect posture for gluteal activation, HS curls with RTB   GOALS:  Goals reviewed with patient? No  SHORT TERM GOALS: Target date: 11/12/2022  Pt will be independent with HEP in order to improve strength and balance in order to decrease fall risk and improve function at home. Baseline: instructed in HEP today, to provide printout next visit Goal status: MET    LONG TERM GOALS: Target date: 03/18/2023  Pt will increase FOTO to at least 50 in order to demonstrate improvement in function related to mobility and QoL. Baseline: 44 7/29:48 9/10:40 12/24/2022= 51 Goal status: MET  2.  Pt (< 60 yrs old) will complete 5xSTS test < 30 seconds indicating an increase in LE strength and improved balance. Baseline: 10/01/22 34 sec (multiple initial attempts with posterior LOB in chair) 12/20: 49 seconds 11/04/22:1:06, min A on multiple attempts to maintain balance and pt stays in flexed posture throughout test when coming to standing.   9/10: 27.5 sec, Pushes WC post consistently, close CGA  10/22: 47 seconds first attempt Goal status: ONGOING  3.  Pt will increase to > 0.5 m/s (30 sec) as to improve gait speed for better home ambulation and reduce fall risk. Baseline: 10/01/22 0.096 m/s with RW, close CGA, and WC follow previously 12/27: 0.11 m/s 11/04/22: .056 m/s  9/10: 076 m/s 10/22: 0.64 m/s  Goal status: ONGOING   4.  Pt will increase hip flexor and abductor  gross strength to 4+/5 bilat as to improve functional strength for independent gait, increased standing tolerance, and increased ADL ability. Baseline: 10/01/22 4+/5, exception hip flexors and abductors 4/5 each bilat  (previously 03/27/22: 4+/5 ) 9/10: Hip flexor 4/5, hip abductor 4+/5 10/22: hip flexor 4/5 hip abductor 4+/5 Goal status: ONGOING  5.  Pt will ambulate  150 ft with BRW and WC follow with no rest breaks to allow for improved mobility within home and increased independence. Baseline: 10/03/22 150' with BRW 04/03/22: 105 ft with BRW 7/29: 100 in 9:30 with pediatric RW 9/10:101 ft with BRW stooped posture worsened throughout test 12/24/2022- 150 feet using BRW, CGA with w/c follow- in 10 min 17 sec. Will keep goal to make sure he can do this consistently 10/22: 119 ft with BRW 8 min 23 seconds terminated by patient  Goal status: ONGOING    ASSESSMENT :  CLINICAL IMPRESSION:  Patient arrived with good motivation form completion of pt activities.  Pt shows good progress with STS activities today from wheel chair. Required moderate cues for LE position and head/hips relationship. Dynamic gait training in all planes to improve postural control and motor recruitment. Improved speed of gait throughout session as well as improve posture.  Pt will continue to benefit from skilled physical therapy intervention to address impairments, improve QOL, and attain therapy goals.   OBJECTIVE IMPAIRMENTS Abnormal gait, decreased activity tolerance, decreased balance, decreased coordination, decreased endurance, decreased mobility, difficulty walking, decreased ROM, decreased strength, improper body mechanics, postural dysfunction, and obesity.   ACTIVITY LIMITATIONS carrying, lifting, bending, sitting, standing, squatting, stairs, transfers, bed mobility, continence, bathing, toileting, dressing, hygiene/grooming, and locomotion level  PARTICIPATION LIMITATIONS: meal prep, cleaning, laundry, interpersonal relationship, driving, shopping, community activity, and yard work  PERSONAL FACTORS Fitness, Past/current experiences, Time since onset of injury/illness/exacerbation, and 3+ comorbidities: hx of brain tumor, past CVA, HTN, obesity, learning disability, DMII  are also affecting patient's functional outcome.   REHAB POTENTIAL: Fair    CLINICAL DECISION MAKING:  Stable/uncomplicated  EVALUATION COMPLEXITY: Low  PLAN: PT FREQUENCY: 1-2x/week  PT DURATION: 12 weeks  PLANNED INTERVENTIONS: Therapeutic exercises,  Therapeutic activity, Neuromuscular re-education, Balance training, Gait training, Patient/Family education, Joint mobilization, Stair training, Vestibular training, Orthotic/Fit training, DME instructions, Dry Needling, Electrical stimulation, Wheelchair mobility training, Cryotherapy, Moist heat, Compression bandaging, Manual therapy, and Re-evaluation  PLAN FOR NEXT SESSION:  As much as possible / is tolerated have patient stand with good posture during his rest breaks. continue with strengthening exercises of the LE's, Functional ambulation when appropriate.    Golden Pop PT ,DPT Physical Therapist- [place]  [facility]

## 2023-02-18 ENCOUNTER — Ambulatory Visit: Payer: 59 | Admitting: Physical Therapy

## 2023-02-18 DIAGNOSIS — R269 Unspecified abnormalities of gait and mobility: Secondary | ICD-10-CM

## 2023-02-18 DIAGNOSIS — R2681 Unsteadiness on feet: Secondary | ICD-10-CM

## 2023-02-18 DIAGNOSIS — R262 Difficulty in walking, not elsewhere classified: Secondary | ICD-10-CM

## 2023-02-18 DIAGNOSIS — R2689 Other abnormalities of gait and mobility: Secondary | ICD-10-CM

## 2023-02-18 DIAGNOSIS — M6281 Muscle weakness (generalized): Secondary | ICD-10-CM

## 2023-02-18 NOTE — Therapy (Signed)
OUTPATIENT PHYSICAL THERAPY NEURO TREATMENT      Patient Name: Curtis Powell MRN: [id number] DOB:[birth year], 57 y.o., male Today's Date: 02/18/2023  PCP: Enid Baas, MD  REFERRING PROVIDER: Enid Baas, MD    PT End of Session - 02/18/23 1149     Visit Number 36    Number of Visits 46    Date for PT Re-Evaluation 03/18/23    Progress Note Due on Visit 40    PT Start Time 1141    PT Stop Time 1223    PT Time Calculation (min) 42 min    Equipment Utilized During Treatment Gait belt    Activity Tolerance Patient tolerated treatment well;No increased pain    Behavior During Therapy WFL for tasks assessed/performed                              Past Medical History:  Diagnosis Date   Asthma    GERD (gastroesophageal reflux disease)    Hyperlipidemia    Hypertension    Obesity    Stroke [facility])    Past Surgical History:  Procedure Laterality Date   BRAIN SURGERY     Patient Active Problem List   Diagnosis Date Noted   Impaired glucose tolerance 03/23/2020   Learning disability 03/23/2020   Urinary and fecal incontinence 06/14/2019   Chronic pain syndrome 03/23/2019   Constipation 03/23/2019   Dysarthria 03/23/2019   Dysphagia 03/23/2019   Learning difficulty 03/23/2019   Morbid obesity (HCC) 03/23/2019   Muscle weakness 03/23/2019   Vitamin D deficiency 03/23/2019   Bilateral carotid artery stenosis 12/15/2018   Moderate aortic valve stenosis 12/15/2018   BMI 45.0-49.9, adult (HCC) 11/10/2018   Cerebrovascular accident (HCC) 05/02/2016   Hemiparesis affecting right side as late effect of cerebrovascular accident (HCC) 05/01/2016   Brain tumor (HCC) 11/24/2014   Gastro-esophageal reflux disease without esophagitis 11/24/2014   Accident due to mechanical fall without injury 08/03/2014   Essential hypertension 06/13/2014   Mixed hyperlipidemia 06/13/2014   Unspecified sequelae of cerebral infarction 06/13/2014   Thyromegaly  01/07/2014   Type 2 diabetes mellitus without complication (HCC) 01/06/2014   Neck mass 09/08/2013   Swimmer's ear 09/08/2013   Erectile dysfunction 01/03/2013   Prediabetes 01/03/2013   ONSET DATE: 05/09/2016  REFERRING DIAG: X52.841 (ICD-10-CM) - Hemiplegia and hemiparesis following cerebral infarction affecting right dominant side   THERAPY DIAG:  Unsteadiness on feet  Difficulty in walking, not elsewhere classified  Other abnormalities of gait and mobility  Abnormality of gait and mobility  Muscle weakness (generalized)  Rationale for Evaluation and Treatment Rehabilitation  SUBJECTIVE:  SUBJECTIVE STATEMENT  Patient reports he is continued with his home exercises since last session.  He has been working on using his wheelchair to strengthen his hamstrings by propelling it forward.  Patient has also worked on standing at the sink for longer durations.  Pt accompanied by: self/  PERTINENT HISTORY:   The pt is a pleasant 57 yo male who returns to PT for evaluation of deficits s/o old CVA. Pt with hx of CVA Feb 2018. He was previously fully independent, but CVA caused significant deficits, mainly affecting his right side. Pt reports continued difficulty with walking and transfers. Pt still using WC for majority of mobility, otherwise he uses his RW. Pt lives with his mom who is his main caregiver. Patient had outpatient PT previously in clinic in 2023. He reports since discharge he has been completing his HEP and walking about every day. Pt would like to improve his walking mainly, but also would like to work on balance. He also reports difficulty with transfers. Pt's mom and his caregiver helps him with ADLs. However, he says he is now waiting for a new caregiver. Prior to this pt had caregiver  3x/week. PMH significant for hx of brain tumor, past CVA, DMII and HTN. See chart for additional information.  PAIN:  Are you having pain? Pt reports no pain currently  PRECAUTIONS: fall  WEIGHT BEARING RESTRICTIONS none  PLOF: required assistance with ADLs (old CVA), prior to CVA in 2018 was indep.  History of Falls: Pt reports no recent falls  Home information: Pt lives with mother who is his primary caregiver. Previously had home aid, 3x/week but reports currently waiting for new one.  PATIENT GOALS: Pt would like to improve his ability to ambulate.  OBJECTIVE:  taken at [facility] unless otherwise noted:   DIAGNOSTIC FINDINGS: No recent imaging per chart  Objective measures taken at eval unless otherwise specified:   Posture: forward head posture, rounded shoulders  MMT LE: grossly  4+/5, exception hip flexors and abductors 4/5 each bilat   Functional tests:  5xSTS:  34 sec, use of BUE (multiple initial attempts with posterior LOB in chair)   : 0.096 m/s with RW, close CGA, and WC follow  Gait for distance (goal 150 ft): deferred due to time  Gait mechanics: impaired, decreased speed (see ), crouched posture, poor postural stability and relies heavily on BUE support on RW. Pt now with bilat AFOS   FOTO: 44 (goal 50)    TODAY'S TREATMENT  02/18/23  TE Nustep level 2 for speed ( 35 BPM )  Nustep level 7 for glute and quad power 3 x 1 min rounds   Seated in WC using HS curl to propel WC x 30 ft   NMR Sit to stand from wheelchair and stand pivot transfer to NuStep  Ambulation with rolling walker and close contact-guard assist to mat table with several turns x 12 feet.  PT instructed pt in dynamic gait training with baritric RW forward/reverse  x 67ft with therapeutic rest break between bouts.  Side stepping L and R 71ft bil with bariatric RW.  Sit to stand from mat table with no upper extremity assist patient utilizes back of knees against table for support  and ambulates with bariatric wall rolling walker to his wheelchair  End of session patient situated in wheelchair and leg rest donned following being doffed at initial phase of session.  Unless otherwise stated, CGA was provided and gait belt donned in order to ensure pt  safety  PATIENT EDUCATION: Education details: Pt educated throughout session about proper posture and technique with exercises. Improved exercise technique, movement at target joints, use of target muscles after min to mod verbal, visual, tactile cues.   Person educated: Patient Education method: Explanation, Demonstration, Tactile cues, and Verbal cues Education comprehension: verbalized understanding, returned demonstration, and verbal cues required  HOME EXERCISE PROGRAM:  Standing hip flexor stretch at counter, prolonged standing with erect posture for gluteal activation, HS curls with RTB   GOALS: Goals reviewed with patient? No  SHORT TERM GOALS: Target date: 11/12/2022  Pt will be independent with HEP in order to improve strength and balance in order to decrease fall risk and improve function at home. Baseline: instructed in HEP today, to provide printout next visit Goal status: MET    LONG TERM GOALS: Target date: 03/18/2023  Pt will increase FOTO to at least 50 in order to demonstrate improvement in function related to mobility and QoL. Baseline: 44 7/29:48 9/10:40 12/24/2022= 51 Goal status: MET  2.  Pt (< 60 yrs old) will complete 5xSTS test < 30 seconds indicating an increase in LE strength and improved balance. Baseline: 10/01/22 34 sec (multiple initial attempts with posterior LOB in chair) 12/20: 49 seconds 11/04/22:1:06, min A on multiple attempts to maintain balance and pt stays in flexed posture throughout test when coming to standing.   9/10: 27.5 sec, Pushes WC post consistently, close CGA  10/22: 47 seconds first attempt Goal status: ONGOING  3.  Pt will increase to > 0.5 m/s (30 sec)  as to improve gait speed for better home ambulation and reduce fall risk. Baseline: 10/01/22 0.096 m/s with RW, close CGA, and WC follow previously 12/27: 0.11 m/s 11/04/22: .056 m/s  9/10: 076 m/s 10/22: 0.64 m/s  Goal status: ONGOING   4.  Pt will increase hip flexor and abductor  gross strength to 4+/5 bilat as to improve functional strength for independent gait, increased standing tolerance, and increased ADL ability. Baseline: 10/01/22 4+/5, exception hip flexors and abductors 4/5 each bilat  (previously 03/27/22: 4+/5 ) 9/10: Hip flexor 4/5, hip abductor 4+/5 10/22: hip flexor 4/5 hip abductor 4+/5 Goal status: ONGOING  5.  Pt will ambulate 150 ft with BRW and WC follow with no rest breaks to allow for improved mobility within home and increased independence. Baseline: 10/03/22 150' with BRW 04/03/22: 105 ft with BRW 7/29: 100 in 9:30 with pediatric RW 9/10:101 ft with BRW stooped posture worsened throughout test 12/24/2022- 150 feet using BRW, CGA with w/c follow- in 10 min 17 sec. Will keep goal to make sure he can do this consistently 10/22: 119 ft with BRW 8 min 23 seconds terminated by patient  Goal status: ONGOING    ASSESSMENT :  CLINICAL IMPRESSION:  Patient arrived with good motivation form completion of pt activities.  Patient continues to progress with lower extremity strengthening and endurance training on NuStep.  Patient also continues with lower extremity dynamic gait training ambulating in several planes of motion including the retro and bilaterally.  Patient still has difficulty with retroambulation causing significant increased time when completing this compared to other directions of movement.. Pt will continue to benefit from skilled physical therapy intervention to address impairments, improve QOL, and attain therapy goals.    OBJECTIVE IMPAIRMENTS Abnormal gait, decreased activity tolerance, decreased balance, decreased coordination, decreased endurance, decreased  mobility, difficulty walking, decreased ROM, decreased strength, improper body mechanics, postural dysfunction, and obesity.   ACTIVITY LIMITATIONS carrying,  lifting, bending, sitting, standing, squatting, stairs, transfers, bed mobility, continence, bathing, toileting, dressing, hygiene/grooming, and locomotion level  PARTICIPATION LIMITATIONS: meal prep, cleaning, laundry, interpersonal relationship, driving, shopping, community activity, and yard work  PERSONAL FACTORS Fitness, Past/current experiences, Time since onset of injury/illness/exacerbation, and 3+ comorbidities: hx of brain tumor, past CVA, HTN, obesity, learning disability, DMII  are also affecting patient's functional outcome.   REHAB POTENTIAL: Fair    CLINICAL DECISION MAKING: Stable/uncomplicated  EVALUATION COMPLEXITY: Low  PLAN: PT FREQUENCY: 1-2x/week  PT DURATION: 12 weeks  PLANNED INTERVENTIONS: Therapeutic exercises, Therapeutic activity, Neuromuscular re-education, Balance training, Gait training, Patient/Family education, Joint mobilization, Stair training, Vestibular training, Orthotic/Fit training, DME instructions, Dry Needling, Electrical stimulation, Wheelchair mobility training, Cryotherapy, Moist heat, Compression bandaging, Manual therapy, and Re-evaluation  PLAN FOR NEXT SESSION:  As much as possible / is tolerated have patient stand with good posture during his rest breaks. continue with strengthening exercises of the LE's, Functional ambulation when appropriate.    Norman Herrlich PT ,DPT Physical Therapist- [place]  [facility]

## 2023-02-20 ENCOUNTER — Ambulatory Visit: Payer: 59 | Admitting: Physical Therapy

## 2023-02-20 ENCOUNTER — Encounter: Payer: Self-pay | Admitting: Physical Therapy

## 2023-02-20 DIAGNOSIS — R262 Difficulty in walking, not elsewhere classified: Secondary | ICD-10-CM

## 2023-02-20 DIAGNOSIS — R2689 Other abnormalities of gait and mobility: Secondary | ICD-10-CM

## 2023-02-20 DIAGNOSIS — M6281 Muscle weakness (generalized): Secondary | ICD-10-CM

## 2023-02-20 DIAGNOSIS — R2681 Unsteadiness on feet: Secondary | ICD-10-CM

## 2023-02-20 DIAGNOSIS — R269 Unspecified abnormalities of gait and mobility: Secondary | ICD-10-CM

## 2023-02-20 NOTE — Therapy (Signed)
OUTPATIENT PHYSICAL THERAPY NEURO TREATMENT      Patient Name: Curtis Powell MRN: [id number] DOB:1966/01/16, 56 y.o., male Today's Date: 02/20/2023  PCP: Enid Baas, MD  REFERRING PROVIDER: Enid Baas, MD    PT End of Session - 02/20/23 1158     Visit Number 37    Number of Visits 46    Date for PT Re-Evaluation 03/18/23    Progress Note Due on Visit 40    PT Start Time 1147    PT Stop Time 1227    PT Time Calculation (min) 40 min    Equipment Utilized During Treatment Gait belt    Activity Tolerance Patient tolerated treatment well;No increased pain    Behavior During Therapy WFL for tasks assessed/performed                              Past Medical History:  Diagnosis Date   Asthma    GERD (gastroesophageal reflux disease)    Hyperlipidemia    Hypertension    Obesity    Stroke [facility])    Past Surgical History:  Procedure Laterality Date   BRAIN SURGERY     Patient Active Problem List   Diagnosis Date Noted   Impaired glucose tolerance 03/23/2020   Learning disability 03/23/2020   Urinary and fecal incontinence 06/14/2019   Chronic pain syndrome 03/23/2019   Constipation 03/23/2019   Dysarthria 03/23/2019   Dysphagia 03/23/2019   Learning difficulty 03/23/2019   Morbid obesity (HCC) 03/23/2019   Muscle weakness 03/23/2019   Vitamin D deficiency 03/23/2019   Bilateral carotid artery stenosis 12/15/2018   Moderate aortic valve stenosis 12/15/2018   BMI 45.0-49.9, adult (HCC) 11/10/2018   Cerebrovascular accident (HCC) 05/02/2016   Hemiparesis affecting right side as late effect of cerebrovascular accident (HCC) 05/01/2016   Brain tumor (HCC) 11/24/2014   Gastro-esophageal reflux disease without esophagitis 11/24/2014   Accident due to mechanical fall without injury 08/03/2014   Essential hypertension 06/13/2014   Mixed hyperlipidemia 06/13/2014   Unspecified sequelae of cerebral infarction 06/13/2014   Thyromegaly  01/07/2014   Type 2 diabetes mellitus without complication (HCC) 01/06/2014   Neck mass 09/08/2013   Swimmer's ear 09/08/2013   Erectile dysfunction 01/03/2013   Prediabetes 01/03/2013   ONSET DATE: 05/09/2016  REFERRING DIAG: N56.213 (ICD-10-CM) - Hemiplegia and hemiparesis following cerebral infarction affecting right dominant side   THERAPY DIAG:  Unsteadiness on feet  Difficulty in walking, not elsewhere classified  Other abnormalities of gait and mobility  Abnormality of gait and mobility  Muscle weakness (generalized)  Rationale for Evaluation and Treatment Rehabilitation  SUBJECTIVE:  SUBJECTIVE STATEMENT  Patient reports he feels he is mobility is improving and he is walking easier at home.  Pt accompanied by: self/  PERTINENT HISTORY:   The pt is a pleasant 57 yo male who returns to PT for evaluation of deficits s/o old CVA. Pt with hx of CVA Feb 2018. He was previously fully independent, but CVA caused significant deficits, mainly affecting his right side. Pt reports continued difficulty with walking and transfers. Pt still using WC for majority of mobility, otherwise he uses his RW. Pt lives with his mom who is his main caregiver. Patient had outpatient PT previously in clinic in 2023. He reports since discharge he has been completing his HEP and walking about every day. Pt would like to improve his walking mainly, but also would like to work on balance. He also reports difficulty with transfers. Pt's mom and his caregiver helps him with ADLs. However, he says he is now waiting for a new caregiver. Prior to this pt had caregiver 3x/week. PMH significant for hx of brain tumor, past CVA, DMII and HTN. See chart for additional information.  PAIN:  Are you having pain? Pt reports no pain  currently  PRECAUTIONS: fall  WEIGHT BEARING RESTRICTIONS none  PLOF: required assistance with ADLs (old CVA), prior to CVA in 2018 was indep.  History of Falls: Pt reports no recent falls  Home information: Pt lives with mother who is his primary caregiver. Previously had home aid, 3x/week but reports currently waiting for new one.  PATIENT GOALS: Pt would like to improve his ability to ambulate.  OBJECTIVE:  taken at [facility] unless otherwise noted:   DIAGNOSTIC FINDINGS: No recent imaging per chart  Objective measures taken at eval unless otherwise specified:   Posture: forward head posture, rounded shoulders  MMT LE: grossly  4+/5, exception hip flexors and abductors 4/5 each bilat   Functional tests:  5xSTS:  34 sec, use of BUE (multiple initial attempts with posterior LOB in chair)   : 0.096 m/s with RW, close CGA, and WC follow  Gait for distance (goal 150 ft): deferred due to time  Gait mechanics: impaired, decreased speed (see ), crouched posture, poor postural stability and relies heavily on BUE support on RW. Pt now with bilat AFOS   FOTO: 44 (goal 50)    TODAY'S TREATMENT  02/20/23  TE Nustep level 2 for speed ( 35 BPM )  Nustep level 8 for glute and quad power 3 x 1 min rounds   Seated in WC using HS curl to propel WC x 30 ft   NMR Sit to stand from wheelchair and stand pivot transfer to NuStep  Ambulation with rolling walker and close contact-guard assist to mat table with several turns x 12 feet.  Sit to stand from mat table with no upper extremity assist patient utilizes back of knees against table for support and ambulates with bariatric wall rolling walker to his wheelchair x 10   PT instructed pt in dynamic gait training with baritric RW forward/reverse  x 68ft with therapeutic rest break between bouts.  Side stepping L and R 69ft bil with bariatric RW.    End of session patient situated in wheelchair and leg rest donned following  being doffed at initial phase of session.  Unless otherwise stated, CGA was provided and gait belt donned in order to ensure pt safety  PATIENT EDUCATION: Education details: Pt educated throughout session about proper posture and technique with exercises. Improved exercise technique, movement  at target joints, use of target muscles after min to mod verbal, visual, tactile cues.   Person educated: Patient Education method: Explanation, Demonstration, Tactile cues, and Verbal cues Education comprehension: verbalized understanding, returned demonstration, and verbal cues required  HOME EXERCISE PROGRAM:  Standing hip flexor stretch at counter, prolonged standing with erect posture for gluteal activation, HS curls with RTB   GOALS: Goals reviewed with patient? No  SHORT TERM GOALS: Target date: 11/12/2022  Pt will be independent with HEP in order to improve strength and balance in order to decrease fall risk and improve function at home. Baseline: instructed in HEP today, to provide printout next visit Goal status: MET    LONG TERM GOALS: Target date: 03/18/2023  Pt will increase FOTO to at least 50 in order to demonstrate improvement in function related to mobility and QoL. Baseline: 44 7/29:48 9/10:40 12/24/2022= 51 Goal status: MET  2.  Pt (< 60 yrs old) will complete 5xSTS test < 30 seconds indicating an increase in LE strength and improved balance. Baseline: 10/01/22 34 sec (multiple initial attempts with posterior LOB in chair) 12/20: 49 seconds 11/04/22:1:06, min A on multiple attempts to maintain balance and pt stays in flexed posture throughout test when coming to standing.   9/10: 27.5 sec, Pushes WC post consistently, close CGA  10/22: 47 seconds first attempt Goal status: ONGOING  3.  Pt will increase to > 0.5 m/s (30 sec) as to improve gait speed for better home ambulation and reduce fall risk. Baseline: 10/01/22 0.096 m/s with RW, close CGA, and WC follow  previously 12/27: 0.11 m/s 11/04/22: .056 m/s  9/10: 076 m/s 10/22: 0.64 m/s  Goal status: ONGOING   4.  Pt will increase hip flexor and abductor  gross strength to 4+/5 bilat as to improve functional strength for independent gait, increased standing tolerance, and increased ADL ability. Baseline: 10/01/22 4+/5, exception hip flexors and abductors 4/5 each bilat  (previously 03/27/22: 4+/5 ) 9/10: Hip flexor 4/5, hip abductor 4+/5 10/22: hip flexor 4/5 hip abductor 4+/5 Goal status: ONGOING  5.  Pt will ambulate 150 ft with BRW and WC follow with no rest breaks to allow for improved mobility within home and increased independence. Baseline: 10/03/22 150' with BRW 04/03/22: 105 ft with BRW 7/29: 100 in 9:30 with pediatric RW 9/10:101 ft with BRW stooped posture worsened throughout test 12/24/2022- 150 feet using BRW, CGA with w/c follow- in 10 min 17 sec. Will keep goal to make sure he can do this consistently 10/22: 119 ft with BRW 8 min 23 seconds terminated by patient  Goal status: ONGOING    ASSESSMENT :  CLINICAL IMPRESSION:  Patient arrived with good motivation form completion of pt activities.  Patient continues to progress with lower extremity strengthening and endurance training on NuStep.  Patient also continues with lower extremity dynamic gait training ambulating in several planes of motion including the retro and bilaterally.  Patient still has difficulty with retroambulation causing significant increased time when completing this compared to other directions of movement. Pt will continue to benefit from skilled physical therapy intervention to address impairments, improve QOL, and attain therapy goals.    OBJECTIVE IMPAIRMENTS Abnormal gait, decreased activity tolerance, decreased balance, decreased coordination, decreased endurance, decreased mobility, difficulty walking, decreased ROM, decreased strength, improper body mechanics, postural dysfunction, and obesity.   ACTIVITY  LIMITATIONS carrying, lifting, bending, sitting, standing, squatting, stairs, transfers, bed mobility, continence, bathing, toileting, dressing, hygiene/grooming, and locomotion level  PARTICIPATION LIMITATIONS: meal  prep, cleaning, laundry, interpersonal relationship, driving, shopping, community activity, and yard work  PERSONAL FACTORS Fitness, Past/current experiences, Time since onset of injury/illness/exacerbation, and 3+ comorbidities: hx of brain tumor, past CVA, HTN, obesity, learning disability, DMII  are also affecting patient's functional outcome.   REHAB POTENTIAL: Fair    CLINICAL DECISION MAKING: Stable/uncomplicated  EVALUATION COMPLEXITY: Low  PLAN: PT FREQUENCY: 1-2x/week  PT DURATION: 12 weeks  PLANNED INTERVENTIONS: Therapeutic exercises, Therapeutic activity, Neuromuscular re-education, Balance training, Gait training, Patient/Family education, Joint mobilization, Stair training, Vestibular training, Orthotic/Fit training, DME instructions, Dry Needling, Electrical stimulation, Wheelchair mobility training, Cryotherapy, Moist heat, Compression bandaging, Manual therapy, and Re-evaluation  PLAN FOR NEXT SESSION:  As much as possible / is tolerated have patient stand with good posture during his rest breaks. continue with strengthening exercises of the LE's, Functional ambulation when appropriate.    Norman Herrlich PT ,DPT Physical Therapist- [place]  [facility]

## 2023-02-25 ENCOUNTER — Encounter: Payer: Self-pay | Admitting: Physical Therapy

## 2023-02-25 ENCOUNTER — Ambulatory Visit: Payer: 59 | Admitting: Physical Therapy

## 2023-02-25 DIAGNOSIS — M6281 Muscle weakness (generalized): Secondary | ICD-10-CM

## 2023-02-25 DIAGNOSIS — R278 Other lack of coordination: Secondary | ICD-10-CM

## 2023-02-25 DIAGNOSIS — R2681 Unsteadiness on feet: Secondary | ICD-10-CM | POA: Diagnosis not present

## 2023-02-25 DIAGNOSIS — R2689 Other abnormalities of gait and mobility: Secondary | ICD-10-CM

## 2023-02-25 DIAGNOSIS — R262 Difficulty in walking, not elsewhere classified: Secondary | ICD-10-CM

## 2023-02-25 DIAGNOSIS — R269 Unspecified abnormalities of gait and mobility: Secondary | ICD-10-CM

## 2023-02-25 NOTE — Therapy (Signed)
OUTPATIENT PHYSICAL THERAPY NEURO TREATMENT      Patient Name: Curtis Powell MRN: [id number] DOB:1965/12/31, 57 y.o., male Today's Date: 02/25/2023  PCP: Enid Baas, MD  REFERRING PROVIDER: Enid Baas, MD    PT End of Session - 02/25/23 1336     Visit Number 38    Number of Visits 46    Date for PT Re-Evaluation 03/18/23    Progress Note Due on Visit 40    PT Start Time 1326    PT Stop Time 1358    PT Time Calculation (min) 32 min    Equipment Utilized During Treatment Gait belt    Activity Tolerance Patient tolerated treatment well;No increased pain    Behavior During Therapy WFL for tasks assessed/performed                              Past Medical History:  Diagnosis Date   Asthma    GERD (gastroesophageal reflux disease)    Hyperlipidemia    Hypertension    Obesity    Stroke [facility])    Past Surgical History:  Procedure Laterality Date   BRAIN SURGERY     Patient Active Problem List   Diagnosis Date Noted   Impaired glucose tolerance 03/23/2020   Learning disability 03/23/2020   Urinary and fecal incontinence 06/14/2019   Chronic pain syndrome 03/23/2019   Constipation 03/23/2019   Dysarthria 03/23/2019   Dysphagia 03/23/2019   Learning difficulty 03/23/2019   Morbid obesity (HCC) 03/23/2019   Muscle weakness 03/23/2019   Vitamin D deficiency 03/23/2019   Bilateral carotid artery stenosis 12/15/2018   Moderate aortic valve stenosis 12/15/2018   BMI 45.0-49.9, adult (HCC) 11/10/2018   Cerebrovascular accident (HCC) 05/02/2016   Hemiparesis affecting right side as late effect of cerebrovascular accident (HCC) 05/01/2016   Brain tumor (HCC) 11/24/2014   Gastro-esophageal reflux disease without esophagitis 11/24/2014   Accident due to mechanical fall without injury 08/03/2014   Essential hypertension 06/13/2014   Mixed hyperlipidemia 06/13/2014   Unspecified sequelae of cerebral infarction 06/13/2014   Thyromegaly  01/07/2014   Type 2 diabetes mellitus without complication (HCC) 01/06/2014   Neck mass 09/08/2013   Swimmer's ear 09/08/2013   Erectile dysfunction 01/03/2013   Prediabetes 01/03/2013   ONSET DATE: 05/09/2016  REFERRING DIAG: X52.841 (ICD-10-CM) - Hemiplegia and hemiparesis following cerebral infarction affecting right dominant side   THERAPY DIAG:  Unsteadiness on feet  Difficulty in walking, not elsewhere classified  Other abnormalities of gait and mobility  Abnormality of gait and mobility  Muscle weakness (generalized)  Other lack of coordination  Rationale for Evaluation and Treatment Rehabilitation  SUBJECTIVE:  SUBJECTIVE STATEMENT  Pt reported feeling well today and no falls since last visit. Pt reported 0/10 on NPS.   Pt accompanied by: Caregiver  PERTINENT HISTORY:   The pt is a pleasant 57 yo male who returns to PT for evaluation of deficits s/o old CVA. Pt with hx of CVA Feb 2018. He was previously fully independent, but CVA caused significant deficits, mainly affecting his right side. Pt reports continued difficulty with walking and transfers. Pt still using WC for majority of mobility, otherwise he uses his RW. Pt lives with his mom who is his main caregiver. Patient had outpatient PT previously in clinic in 2023. He reports since discharge he has been completing his HEP and walking about every day. Pt would like to improve his walking mainly, but also would like to work on balance. He also reports difficulty with transfers. Pt's mom and his caregiver helps him with ADLs. However, he says he is now waiting for a new caregiver. Prior to this pt had caregiver 3x/week. PMH significant for hx of brain tumor, past CVA, DMII and HTN. See chart for additional information.  PAIN:  Are  you having pain? Pt reports no pain currently  PRECAUTIONS: fall  WEIGHT BEARING RESTRICTIONS none  PLOF: required assistance with ADLs (old CVA), prior to CVA in 2018 was indep.  History of Falls: Pt reports no recent falls  Home information: Pt lives with mother who is his primary caregiver. Previously had home aid, 3x/week but reports currently waiting for new one.  PATIENT GOALS: Pt would like to improve his ability to ambulate.  OBJECTIVE:  taken at [facility] unless otherwise noted:   DIAGNOSTIC FINDINGS: No recent imaging per chart  Objective measures taken at eval unless otherwise specified:   Posture: forward head posture, rounded shoulders  MMT LE: grossly  4+/5, exception hip flexors and abductors 4/5 each bilat   Functional tests:  5xSTS:  34 sec, use of BUE (multiple initial attempts with posterior LOB in chair)   : 0.096 m/s with RW, close CGA, and WC follow  Gait for distance (goal 150 ft): deferred due to time  Gait mechanics: impaired, decreased speed (see ), crouched posture, poor postural stability and relies heavily on BUE support on RW. Pt now with bilat AFOS   FOTO: 44 (goal 50)    TODAY'S TREATMENT  02/25/23  Today's visit was limited, due to pt's public transportation running late.   TE Nustep level 2 for speed ( 35 BPM )  Nustep level 8 for glute and quad power 3 x 1 min rounds   TA Sit to stand from wheelchair and stand pivot transfer to NuStep  Ambulation with RW (VC's for pt to utilize glute strength to come into standing):  1 round 8 feet 1 round 12 feet  1 round 15 feet  End of session patient situated in wheelchair and leg rest donned following being doffed at initial phase of session.  Unless otherwise stated, CGA was provided and gait belt donned in order to ensure pt safety  PATIENT EDUCATION: Education details: Pt educated throughout session about proper posture and technique with exercises. Improved exercise  technique, movement at target joints, use of target muscles after min to mod verbal, visual, tactile cues.   Person educated: Patient Education method: Explanation, Demonstration, Tactile cues, and Verbal cues Education comprehension: verbalized understanding, returned demonstration, and verbal cues required  HOME EXERCISE PROGRAM:  Standing hip flexor stretch at counter, prolonged standing with erect posture for gluteal  activation, HS curls with RTB   GOALS: Goals reviewed with patient? No  SHORT TERM GOALS: Target date: 11/12/2022  Pt will be independent with HEP in order to improve strength and balance in order to decrease fall risk and improve function at home. Baseline: instructed in HEP today, to provide printout next visit Goal status: MET    LONG TERM GOALS: Target date: 03/18/2023  Pt will increase FOTO to at least 50 in order to demonstrate improvement in function related to mobility and QoL. Baseline: 44 7/29:48 9/10:40 12/24/2022= 51 Goal status: MET  2.  Pt (< 60 yrs old) will complete 5xSTS test < 30 seconds indicating an increase in LE strength and improved balance. Baseline: 10/01/22 34 sec (multiple initial attempts with posterior LOB in chair) 12/20: 49 seconds 11/04/22:1:06, min A on multiple attempts to maintain balance and pt stays in flexed posture throughout test when coming to standing.   9/10: 27.5 sec, Pushes WC post consistently, close CGA  10/22: 47 seconds first attempt Goal status: ONGOING  3.  Pt will increase to > 0.5 m/s (30 sec) as to improve gait speed for better home ambulation and reduce fall risk. Baseline: 10/01/22 0.096 m/s with RW, close CGA, and WC follow previously 12/27: 0.11 m/s 11/04/22: .056 m/s  9/10: 076 m/s 10/22: 0.64 m/s  Goal status: ONGOING   4.  Pt will increase hip flexor and abductor  gross strength to 4+/5 bilat as to improve functional strength for independent gait, increased standing tolerance, and increased ADL  ability. Baseline: 10/01/22 4+/5, exception hip flexors and abductors 4/5 each bilat  (previously 03/27/22: 4+/5 ) 9/10: Hip flexor 4/5, hip abductor 4+/5 10/22: hip flexor 4/5 hip abductor 4+/5 Goal status: ONGOING  5.  Pt will ambulate 150 ft with BRW and WC follow with no rest breaks to allow for improved mobility within home and increased independence. Baseline: 10/03/22 150' with BRW 04/03/22: 105 ft with BRW 7/29: 100 in 9:30 with pediatric RW 9/10:101 ft with BRW stooped posture worsened throughout test 12/24/2022- 150 feet using BRW, CGA with w/c follow- in 10 min 17 sec. Will keep goal to make sure he can do this consistently 10/22: 119 ft with BRW 8 min 23 seconds terminated by patient  Goal status: ONGOING    ASSESSMENT :  CLINICAL IMPRESSION:  Pt arrived motivated to complete tasks for today's visit. Before each trial of ambulation began, pt required VC's to utilize glute strength to come into standing. Pt required multiple attempts to come into standing at the beginning of the visit to transfer from [facility] to Nustep, but attempts to come into standing decreased as the visit progressed. Today's visit was limited, due to pt's public transportation running late. Pt will continue to benefit from skilled physical therapy intervention to address impairments, improve QOL, and attain therapy goals.    OBJECTIVE IMPAIRMENTS Abnormal gait, decreased activity tolerance, decreased balance, decreased coordination, decreased endurance, decreased mobility, difficulty walking, decreased ROM, decreased strength, improper body mechanics, postural dysfunction, and obesity.   ACTIVITY LIMITATIONS carrying, lifting, bending, sitting, standing, squatting, stairs, transfers, bed mobility, continence, bathing, toileting, dressing, hygiene/grooming, and locomotion level  PARTICIPATION LIMITATIONS: meal prep, cleaning, laundry, interpersonal relationship, driving, shopping, community activity, and yard  work  PERSONAL FACTORS Fitness, Past/current experiences, Time since onset of injury/illness/exacerbation, and 3+ comorbidities: hx of brain tumor, past CVA, HTN, obesity, learning disability, DMII  are also affecting patient's functional outcome.   REHAB POTENTIAL: Fair    CLINICAL DECISION  MAKING: Stable/uncomplicated  EVALUATION COMPLEXITY: Low  PLAN: PT FREQUENCY: 1-2x/week  PT DURATION: 12 weeks  PLANNED INTERVENTIONS: Therapeutic exercises, Therapeutic activity, Neuromuscular re-education, Balance training, Gait training, Patient/Family education, Joint mobilization, Stair training, Vestibular training, Orthotic/Fit training, DME instructions, Dry Needling, Electrical stimulation, Wheelchair mobility training, Cryotherapy, Moist heat, Compression bandaging, Manual therapy, and Re-evaluation  PLAN FOR NEXT SESSION:  As much as possible / is tolerated have patient stand with good posture during his rest breaks. continue with strengthening exercises of the LE's, Functional ambulation when appropriate.  Debara Pickett, SPT  Norman Herrlich PT ,DPT Physical Therapist- [facility]

## 2023-02-27 ENCOUNTER — Ambulatory Visit: Payer: 59 | Admitting: Physical Therapy

## 2023-02-27 ENCOUNTER — Encounter: Payer: Self-pay | Admitting: Physical Therapy

## 2023-02-27 DIAGNOSIS — R2689 Other abnormalities of gait and mobility: Secondary | ICD-10-CM

## 2023-02-27 DIAGNOSIS — R2681 Unsteadiness on feet: Secondary | ICD-10-CM

## 2023-02-27 DIAGNOSIS — R278 Other lack of coordination: Secondary | ICD-10-CM

## 2023-02-27 DIAGNOSIS — R296 Repeated falls: Secondary | ICD-10-CM

## 2023-02-27 DIAGNOSIS — R269 Unspecified abnormalities of gait and mobility: Secondary | ICD-10-CM

## 2023-02-27 DIAGNOSIS — M6281 Muscle weakness (generalized): Secondary | ICD-10-CM

## 2023-02-27 DIAGNOSIS — R262 Difficulty in walking, not elsewhere classified: Secondary | ICD-10-CM

## 2023-02-27 NOTE — Therapy (Signed)
OUTPATIENT PHYSICAL THERAPY NEURO TREATMENT      Patient Name: Curtis Powell MRN: [id number] DOB:[birth year], 57 y.o., male Today's Date: 02/27/2023  PCP: Enid Baas, MD  REFERRING PROVIDER: Enid Baas, MD    PT End of Session - 02/27/23 1329     Visit Number 39    Number of Visits 46    Date for PT Re-Evaluation 03/18/23    Progress Note Due on Visit 40    PT Start Time 1320    PT Stop Time 1357    PT Time Calculation (min) 37 min    Equipment Utilized During Treatment Gait belt    Activity Tolerance Patient tolerated treatment well;No increased pain    Behavior During Therapy WFL for tasks assessed/performed                              Past Medical History:  Diagnosis Date   Asthma    GERD (gastroesophageal reflux disease)    Hyperlipidemia    Hypertension    Obesity    Stroke [facility])    Past Surgical History:  Procedure Laterality Date   BRAIN SURGERY     Patient Active Problem List   Diagnosis Date Noted   Impaired glucose tolerance 03/23/2020   Learning disability 03/23/2020   Urinary and fecal incontinence 06/14/2019   Chronic pain syndrome 03/23/2019   Constipation 03/23/2019   Dysarthria 03/23/2019   Dysphagia 03/23/2019   Learning difficulty 03/23/2019   Morbid obesity (HCC) 03/23/2019   Muscle weakness 03/23/2019   Vitamin D deficiency 03/23/2019   Bilateral carotid artery stenosis 12/15/2018   Moderate aortic valve stenosis 12/15/2018   BMI 45.0-49.9, adult (HCC) 11/10/2018   Cerebrovascular accident (HCC) 05/02/2016   Hemiparesis affecting right side as late effect of cerebrovascular accident (HCC) 05/01/2016   Brain tumor (HCC) 11/24/2014   Gastro-esophageal reflux disease without esophagitis 11/24/2014   Accident due to mechanical fall without injury 08/03/2014   Essential hypertension 06/13/2014   Mixed hyperlipidemia 06/13/2014   Unspecified sequelae of cerebral infarction 06/13/2014   Thyromegaly  01/07/2014   Type 2 diabetes mellitus without complication (HCC) 01/06/2014   Neck mass 09/08/2013   Swimmer's ear 09/08/2013   Erectile dysfunction 01/03/2013   Prediabetes 01/03/2013   ONSET DATE: 05/09/2016  REFERRING DIAG: G95.621 (ICD-10-CM) - Hemiplegia and hemiparesis following cerebral infarction affecting right dominant side   THERAPY DIAG:  Unsteadiness on feet  Difficulty in walking, not elsewhere classified  Other abnormalities of gait and mobility  Abnormality of gait and mobility  Muscle weakness (generalized)  Other lack of coordination  Repeated falls  Rationale for Evaluation and Treatment Rehabilitation  SUBJECTIVE:  SUBJECTIVE STATEMENT  Pt reported feeling well today and no falls since last visit. Pt reported 0/10 on NPS.   Pt accompanied by: Caregiver  PERTINENT HISTORY:   The pt is a pleasant 57 yo male who returns to PT for evaluation of deficits s/o old CVA. Pt with hx of CVA Feb 2018. He was previously fully independent, but CVA caused significant deficits, mainly affecting his right side. Pt reports continued difficulty with walking and transfers. Pt still using WC for majority of mobility, otherwise he uses his RW. Pt lives with his mom who is his main caregiver. Patient had outpatient PT previously in clinic in 2023. He reports since discharge he has been completing his HEP and walking about every day. Pt would like to improve his walking mainly, but also would like to work on balance. He also reports difficulty with transfers. Pt's mom and his caregiver helps him with ADLs. However, he says he is now waiting for a new caregiver. Prior to this pt had caregiver 3x/week. PMH significant for hx of brain tumor, past CVA, DMII and HTN. See chart for additional  information.  PAIN:  Are you having pain? Pt reports no pain currently  PRECAUTIONS: fall  WEIGHT BEARING RESTRICTIONS none  PLOF: required assistance with ADLs (old CVA), prior to CVA in 2018 was indep.  History of Falls: Pt reports no recent falls  Home information: Pt lives with mother who is his primary caregiver. Previously had home aid, 3x/week but reports currently waiting for new one.  PATIENT GOALS: Pt would like to improve his ability to ambulate.  OBJECTIVE:  taken at [facility] unless otherwise noted:   DIAGNOSTIC FINDINGS: No recent imaging per chart  Objective measures taken at eval unless otherwise specified:   Posture: forward head posture, rounded shoulders  MMT LE: grossly  4+/5, exception hip flexors and abductors 4/5 each bilat   Functional tests:  5xSTS:  34 sec, use of BUE (multiple initial attempts with posterior LOB in chair)   : 0.096 m/s with RW, close CGA, and WC follow  Gait for distance (goal 150 ft): deferred due to time  Gait mechanics: impaired, decreased speed (see ), crouched posture, poor postural stability and relies heavily on BUE support on RW. Pt now with bilat AFOS   FOTO: 44 (goal 50)    TODAY'S TREATMENT  02/27/23  TE Nustep level 2 for speed ( 35 BPM ) x 6 minutes - LE only Nustep level 8 for glute and quad power 3 x 1 min rounds - LE only  TA Sit to stand from wheelchair and stand pivot transfer to NuStep  Ambulation with RW (VC's for pt to utilize glute strength to come into standing):  1 round 8 feet 1 round 12 feet  1 round 20 feet  End of session patient situated in wheelchair and leg rest donned following being doffed at initial phase of session.  Unless otherwise stated, CGA was provided and gait belt donned in order to ensure pt safety  PATIENT EDUCATION: Education details: Pt educated throughout session about proper posture and technique with exercises. Improved exercise technique, movement at target  joints, use of target muscles after min to mod verbal, visual, tactile cues.   Person educated: Patient Education method: Explanation, Demonstration, Tactile cues, and Verbal cues Education comprehension: verbalized understanding, returned demonstration, and verbal cues required  HOME EXERCISE PROGRAM:  Standing hip flexor stretch at counter, prolonged standing with erect posture for gluteal activation, HS curls with RTB  GOALS: Goals reviewed with patient? No  SHORT TERM GOALS: Target date: 11/12/2022  Pt will be independent with HEP in order to improve strength and balance in order to decrease fall risk and improve function at home. Baseline: instructed in HEP today, to provide printout next visit Goal status: MET    LONG TERM GOALS: Target date: 03/18/2023  Pt will increase FOTO to at least 50 in order to demonstrate improvement in function related to mobility and QoL. Baseline: 44 7/29:48 9/10:40 12/24/2022= 51 Goal status: MET  2.  Pt (< 60 yrs old) will complete 5xSTS test < 30 seconds indicating an increase in LE strength and improved balance. Baseline: 10/01/22 34 sec (multiple initial attempts with posterior LOB in chair) 12/20: 49 seconds 11/04/22:1:06, min A on multiple attempts to maintain balance and pt stays in flexed posture throughout test when coming to standing.   9/10: 27.5 sec, Pushes WC post consistently, close CGA  10/22: 47 seconds first attempt Goal status: ONGOING  3.  Pt will increase to > 0.5 m/s (30 sec) as to improve gait speed for better home ambulation and reduce fall risk. Baseline: 10/01/22 0.096 m/s with RW, close CGA, and WC follow previously 12/27: 0.11 m/s 11/04/22: .056 m/s  9/10: 076 m/s 10/22: 0.64 m/s  Goal status: ONGOING   4.  Pt will increase hip flexor and abductor  gross strength to 4+/5 bilat as to improve functional strength for independent gait, increased standing tolerance, and increased ADL ability. Baseline: 10/01/22 4+/5,  exception hip flexors and abductors 4/5 each bilat  (previously 03/27/22: 4+/5 ) 9/10: Hip flexor 4/5, hip abductor 4+/5 10/22: hip flexor 4/5 hip abductor 4+/5 Goal status: ONGOING  5.  Pt will ambulate 150 ft with BRW and WC follow with no rest breaks to allow for improved mobility within home and increased independence. Baseline: 10/03/22 150' with BRW 04/03/22: 105 ft with BRW 7/29: 100 in 9:30 with pediatric RW 9/10:101 ft with BRW stooped posture worsened throughout test 12/24/2022- 150 feet using BRW, CGA with w/c follow- in 10 min 17 sec. Will keep goal to make sure he can do this consistently 10/22: 119 ft with BRW 8 min 23 seconds terminated by patient  Goal status: ONGOING    ASSESSMENT :  CLINICAL IMPRESSION:  Pt arrived motivated to complete tasks for today's visit. Before each trial of ambulation began, pt required VC's to utilize glute strength to come into standing for full erect posture. Pt required multiple attempts to come into standing before transferring to the Nustep from [facility], but came into standing on the first attempt before all other ambulation trials. Pt was able to ambulate a slightly increased distance on the last trial of ambulation, compared to last visit. Pt will continue to benefit from skilled physical therapy intervention to address impairments, improve QOL, and attain therapy goals.    OBJECTIVE IMPAIRMENTS Abnormal gait, decreased activity tolerance, decreased balance, decreased coordination, decreased endurance, decreased mobility, difficulty walking, decreased ROM, decreased strength, improper body mechanics, postural dysfunction, and obesity.   ACTIVITY LIMITATIONS carrying, lifting, bending, sitting, standing, squatting, stairs, transfers, bed mobility, continence, bathing, toileting, dressing, hygiene/grooming, and locomotion level  PARTICIPATION LIMITATIONS: meal prep, cleaning, laundry, interpersonal relationship, driving, shopping, community activity,  and yard work  PERSONAL FACTORS Fitness, Past/current experiences, Time since onset of injury/illness/exacerbation, and 3+ comorbidities: hx of brain tumor, past CVA, HTN, obesity, learning disability, DMII  are also affecting patient's functional outcome.   REHAB POTENTIAL: Fair  CLINICAL DECISION MAKING: Stable/uncomplicated  EVALUATION COMPLEXITY: Low  PLAN: PT FREQUENCY: 1-2x/week  PT DURATION: 12 weeks  PLANNED INTERVENTIONS: Therapeutic exercises, Therapeutic activity, Neuromuscular re-education, Balance training, Gait training, Patient/Family education, Joint mobilization, Stair training, Vestibular training, Orthotic/Fit training, DME instructions, Dry Needling, Electrical stimulation, Wheelchair mobility training, Cryotherapy, Moist heat, Compression bandaging, Manual therapy, and Re-evaluation  PLAN FOR NEXT SESSION:  As much as possible / is tolerated have patient stand with good posture during his rest breaks. continue with strengthening exercises of the LE's, Functional ambulation when appropriate.  Debara Pickett, SPT  Norman Herrlich PT ,DPT Physical Therapist- [facility]

## 2023-03-04 ENCOUNTER — Ambulatory Visit: Payer: 59

## 2023-03-04 DIAGNOSIS — R2681 Unsteadiness on feet: Secondary | ICD-10-CM

## 2023-03-04 DIAGNOSIS — R269 Unspecified abnormalities of gait and mobility: Secondary | ICD-10-CM

## 2023-03-04 DIAGNOSIS — M6281 Muscle weakness (generalized): Secondary | ICD-10-CM

## 2023-03-04 DIAGNOSIS — R531 Weakness: Secondary | ICD-10-CM

## 2023-03-04 DIAGNOSIS — R262 Difficulty in walking, not elsewhere classified: Secondary | ICD-10-CM

## 2023-03-04 DIAGNOSIS — R296 Repeated falls: Secondary | ICD-10-CM

## 2023-03-04 DIAGNOSIS — R278 Other lack of coordination: Secondary | ICD-10-CM

## 2023-03-04 DIAGNOSIS — I69351 Hemiplegia and hemiparesis following cerebral infarction affecting right dominant side: Secondary | ICD-10-CM

## 2023-03-04 DIAGNOSIS — R2689 Other abnormalities of gait and mobility: Secondary | ICD-10-CM

## 2023-03-04 NOTE — Therapy (Signed)
OUTPATIENT PHYSICAL THERAPY NEURO TREATMENT/Physical Therapy Progress Note   Dates of reporting period  01/28/23 to 03/04/23    Patient Name: Curtis Powell MRN: [id number] DOB:12-18-1965, 57 y.o., male Today's Date: 03/04/2023  PCP: Enid Baas, MD  REFERRING PROVIDER: Enid Baas, MD    PT End of Session - 03/04/23 1007     Visit Number 40    Number of Visits 46    Date for PT Re-Evaluation 03/18/23    Progress Note Due on Visit 50    PT Start Time 1015    PT Stop Time 1057    PT Time Calculation (min) 42 min    Equipment Utilized During Treatment Gait belt    Activity Tolerance Patient tolerated treatment well;No increased pain    Behavior During Therapy WFL for tasks assessed/performed                              Past Medical History:  Diagnosis Date   Asthma    GERD (gastroesophageal reflux disease)    Hyperlipidemia    Hypertension    Obesity    Stroke [facility])    Past Surgical History:  Procedure Laterality Date   BRAIN SURGERY     Patient Active Problem List   Diagnosis Date Noted   Impaired glucose tolerance 03/23/2020   Learning disability 03/23/2020   Urinary and fecal incontinence 06/14/2019   Chronic pain syndrome 03/23/2019   Constipation 03/23/2019   Dysarthria 03/23/2019   Dysphagia 03/23/2019   Learning difficulty 03/23/2019   Morbid obesity (HCC) 03/23/2019   Muscle weakness 03/23/2019   Vitamin D deficiency 03/23/2019   Bilateral carotid artery stenosis 12/15/2018   Moderate aortic valve stenosis 12/15/2018   BMI 45.0-49.9, adult (HCC) 11/10/2018   Cerebrovascular accident (HCC) 05/02/2016   Hemiparesis affecting right side as late effect of cerebrovascular accident (HCC) 05/01/2016   Brain tumor (HCC) 11/24/2014   Gastro-esophageal reflux disease without esophagitis 11/24/2014   Accident due to mechanical fall without injury 08/03/2014   Essential hypertension 06/13/2014   Mixed hyperlipidemia  06/13/2014   Unspecified sequelae of cerebral infarction 06/13/2014   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/2014   Type 2 diabetes mellitus without complication (HCC) 01/06/2014   Neck mass 09/08/2013   Swimmer's ear 09/08/2013   Erectile dysfunction 01/03/2013   Prediabetes 01/03/2013   ONSET DATE: 05/09/2016  REFERRING DIAG: W09.811 (ICD-10-CM) - Hemiplegia and hemiparesis following cerebral infarction affecting right dominant side   THERAPY DIAG:  Unsteadiness on feet  Difficulty in walking, not elsewhere classified  Other abnormalities of gait and mobility  Abnormality of gait and mobility  Muscle weakness (generalized)  Other lack of coordination  Repeated falls  Left-sided weakness  Hemiplegia and hemiparesis following cerebral infarction affecting right dominant side (HCC)  Rationale for Evaluation and Treatment Rehabilitation  SUBJECTIVE:  SUBJECTIVE STATEMENT  Pt reported feeling well today and no falls since last visit. Pt reported 0/10 on NPS.   Pt accompanied by: self  PERTINENT HISTORY:   The pt is a pleasant 57 yo male who returns to PT for evaluation of deficits s/o old CVA. Pt with hx of CVA Feb 2018. He was previously fully independent, but CVA caused significant deficits, mainly affecting his right side. Pt reports continued difficulty with walking and transfers. Pt still using WC for majority of mobility, otherwise he uses his RW. Pt lives with his mom who is his main caregiver. Patient had outpatient PT previously in clinic in 2023. He reports since discharge he has been completing his HEP and walking about every day. Pt would like to improve his walking mainly, but also would like to work on balance. He also reports difficulty with transfers. Pt's mom and his caregiver helps him with  ADLs. However, he says he is now waiting for a new caregiver. Prior to this pt had caregiver 3x/week. PMH significant for hx of brain tumor, past CVA, DMII and HTN. See chart for additional information.  PAIN:  Are you having pain? Pt reports no pain currently  PRECAUTIONS: fall  WEIGHT BEARING RESTRICTIONS none  PLOF: required assistance with ADLs (old CVA), prior to CVA in 2018 was indep.  History of Falls: Pt reports no recent falls  Home information: Pt lives with mother who is his primary caregiver. Previously had home aid, 3x/week but reports currently waiting for new one.  PATIENT GOALS: Pt would like to improve his ability to ambulate.  OBJECTIVE:  taken at [facility] unless otherwise noted:   DIAGNOSTIC FINDINGS: No recent imaging per chart  Objective measures taken at eval unless otherwise specified:   Posture: forward head posture, rounded shoulders  MMT LE: grossly  4+/5, exception hip flexors and abductors 4/5 each bilat   Functional tests:  5xSTS:  34 sec, use of BUE (multiple initial attempts with posterior LOB in chair)   : 0.096 m/s with RW, close CGA, and WC follow  Gait for distance (goal 150 ft): deferred due to time  Gait mechanics: impaired, decreased speed (see ), crouched posture, poor postural stability and relies heavily on BUE support on RW. Pt now with bilat AFOS   FOTO: 44 (goal 50)    TODAY'S TREATMENT  03/04/23  Physical therapy treatment session today consisted of completing assessment of goals and administration of testing as demonstrated and documented in flow sheet, treatment, and goals section of this note. Addition treatments may be found below.    TA Ambulation with CGA and WC follow 35 feet  End of session patient situated in wheelchair and leg rest donned following being doffed at initial phase of session.  Unless otherwise stated, CGA was provided and gait belt donned in order to ensure pt safety  PATIENT  EDUCATION: Education details: Pt educated throughout session about proper posture and technique with exercises. Improved exercise technique, movement at target joints, use of target muscles after min to mod verbal, visual, tactile cues.   Person educated: Patient Education method: Explanation, Demonstration, Tactile cues, and Verbal cues Education comprehension: verbalized understanding, returned demonstration, and verbal cues required  HOME EXERCISE PROGRAM:  Standing hip flexor stretch at counter, prolonged standing with erect posture for gluteal activation, HS curls with RTB   GOALS: Goals reviewed with patient? No  SHORT TERM GOALS: Target date: 11/12/2022  Pt will be independent with HEP in order to improve strength and  balance in order to decrease fall risk and improve function at home. Baseline: instructed in HEP today, to provide printout next visit Goal status: MET    LONG TERM GOALS: Target date: 03/18/2023  Pt will increase FOTO to at least 50 in order to demonstrate improvement in function related to mobility and QoL. Baseline: 44 7/29:48 9/10:40 12/24/2022= 51 Goal status: MET  2.  Pt (< 60 yrs old) will complete 5xSTS test < 30 seconds indicating an increase in LE strength and improved balance. Baseline: 10/01/22 34 sec (multiple initial attempts with posterior LOB in chair) 12/20: 49 seconds 11/04/22:1:06, min A on multiple attempts to maintain balance and pt stays in flexed posture throughout test when coming to standing.   9/10: 27.5 sec, Pushes WC post consistently, close CGA  10/22: 47 seconds first attempt Goal status: ONGOING  3.  Pt will increase to > 0.5 m/s (30 sec) as to improve gait speed for better home ambulation and reduce fall risk. Baseline: 10/01/22 0.096 m/s with RW, close CGA, and WC follow previously 12/27: 0.11 m/s 11/04/22: .056 m/s  9/10: 076 m/s 10/22: 0.64 m/s  11/26: .076 m/s Goal status: ONGOING   4.  Pt will increase hip flexor and  abductor  gross strength to 4+/5 bilat as to improve functional strength for independent gait, increased standing tolerance, and increased ADL ability. Baseline: 10/01/22 4+/5, exception hip flexors and abductors 4/5 each bilat  (previously 03/27/22: 4+/5 ) 9/10: Hip flexor 4/5, hip abductor 4+/5 10/22: hip flexor 4/5 hip abductor 4+/5 11/26: hip flexor 4+/5 hip abductor 4+/5 Goal status: MET  5.  Pt will ambulate 150 ft with BRW and WC follow with no rest breaks to allow for improved mobility within home and increased independence. Baseline: 10/03/22 150' with BRW 04/03/22: 105 ft with BRW 7/29: 100 in 9:30 with pediatric RW 9/10:101 ft with BRW stooped posture worsened throughout test 12/24/2022- 150 feet using BRW, CGA with w/c follow- in 10 min 17 sec. Will keep goal to make sure he can do this consistently 10/22: 119 ft with BRW 8 min 23 seconds terminated by patient  11/26: 148 ft with BRW 14 min 34 seconds terminated by patient Goal status: ONGOING    ASSESSMENT :  CLINICAL IMPRESSION:  Today's progress note focused on assessment of ongoing goals. Pt met the goal of achieving 4+/5 hip flexor and abductor gross strength. Pt improved in the goal of ambulation distance by ambulating 29 feet more compared to last progress note and by increasing gait speed by 0.012 m/s compared to last progress note. The 5xSTS was deferred until next visit, due to fatigue. Patient's condition has the potential to improve in response to therapy. Maximum improvement is yet to be obtained. The anticipated improvement is attainable and reasonable in a generally predictable time. Pt will continue to benefit from skilled physical therapy intervention to address impairments, improve QOL, and attain therapy goals.    OBJECTIVE IMPAIRMENTS Abnormal gait, decreased activity tolerance, decreased balance, decreased coordination, decreased endurance, decreased mobility, difficulty walking, decreased ROM, decreased  strength, improper body mechanics, postural dysfunction, and obesity.   ACTIVITY LIMITATIONS carrying, lifting, bending, sitting, standing, squatting, stairs, transfers, bed mobility, continence, bathing, toileting, dressing, hygiene/grooming, and locomotion level  PARTICIPATION LIMITATIONS: meal prep, cleaning, laundry, interpersonal relationship, driving, shopping, community activity, and yard work  PERSONAL FACTORS Fitness, Past/current experiences, Time since onset of injury/illness/exacerbation, and 3+ comorbidities: hx of brain tumor, past CVA, HTN, obesity, learning disability, DMII  are  also affecting patient's functional outcome.   REHAB POTENTIAL: Fair    CLINICAL DECISION MAKING: Stable/uncomplicated  EVALUATION COMPLEXITY: Low  PLAN: PT FREQUENCY: 1-2x/week  PT DURATION: 12 weeks  PLANNED INTERVENTIONS: Therapeutic exercises, Therapeutic activity, Neuromuscular re-education, Balance training, Gait training, Patient/Family education, Joint mobilization, Stair training, Vestibular training, Orthotic/Fit training, DME instructions, Dry Needling, Electrical stimulation, Wheelchair mobility training, Cryotherapy, Moist heat, Compression bandaging, Manual therapy, and Re-evaluation  PLAN FOR NEXT SESSION:  Reassess 5xSTS, As much as possible / is tolerated have patient stand with good posture during his rest breaks. continue with strengthening exercises of the LE's, Functional ambulation when appropriate.  Debara Pickett, SPT  This entire session was performed under direct supervision and direction of a licensed Estate agent . I have personally read, edited and approve of the note as written.   Lenda Kelp PT Physical Therapist-   [facility]

## 2023-03-11 ENCOUNTER — Ambulatory Visit: Payer: 59

## 2023-03-13 ENCOUNTER — Ambulatory Visit: Payer: 59 | Attending: Internal Medicine

## 2023-03-13 DIAGNOSIS — R531 Weakness: Secondary | ICD-10-CM | POA: Insufficient documentation

## 2023-03-13 DIAGNOSIS — R278 Other lack of coordination: Secondary | ICD-10-CM | POA: Insufficient documentation

## 2023-03-13 DIAGNOSIS — I69351 Hemiplegia and hemiparesis following cerebral infarction affecting right dominant side: Secondary | ICD-10-CM | POA: Diagnosis present

## 2023-03-13 DIAGNOSIS — R2689 Other abnormalities of gait and mobility: Secondary | ICD-10-CM | POA: Insufficient documentation

## 2023-03-13 DIAGNOSIS — R269 Unspecified abnormalities of gait and mobility: Secondary | ICD-10-CM | POA: Diagnosis present

## 2023-03-13 DIAGNOSIS — R262 Difficulty in walking, not elsewhere classified: Secondary | ICD-10-CM | POA: Diagnosis present

## 2023-03-13 DIAGNOSIS — R2681 Unsteadiness on feet: Secondary | ICD-10-CM | POA: Diagnosis present

## 2023-03-13 DIAGNOSIS — M6281 Muscle weakness (generalized): Secondary | ICD-10-CM | POA: Diagnosis present

## 2023-03-13 DIAGNOSIS — R296 Repeated falls: Secondary | ICD-10-CM | POA: Insufficient documentation

## 2023-03-13 NOTE — Therapy (Signed)
OUTPATIENT PHYSICAL THERAPY NEURO TREATMENT    Patient Name: Curtis Powell MRN: [id number] DOB:27-Oct-1965, 57 y.o., male Today's Date: 03/13/2023  PCP: Enid Baas, MD  REFERRING PROVIDER: Enid Baas, MD    PT End of Session - 03/13/23 1314     Visit Number 41    Number of Visits 46    Date for PT Re-Evaluation 03/18/23    Progress Note Due on Visit 50    PT Start Time 1315    PT Stop Time 1358    PT Time Calculation (min) 43 min    Equipment Utilized During Treatment Gait belt    Activity Tolerance Patient tolerated treatment well;No increased pain    Behavior During Therapy WFL for tasks assessed/performed                              Past Medical History:  Diagnosis Date   Asthma    GERD (gastroesophageal reflux disease)    Hyperlipidemia    Hypertension    Obesity    Stroke [facility])    Past Surgical History:  Procedure Laterality Date   BRAIN SURGERY     Patient Active Problem List   Diagnosis Date Noted   Impaired glucose tolerance 03/23/2020   Learning disability 03/23/2020   Urinary and fecal incontinence 06/14/2019   Chronic pain syndrome 03/23/2019   Constipation 03/23/2019   Dysarthria 03/23/2019   Dysphagia 03/23/2019   Learning difficulty 03/23/2019   Morbid obesity (HCC) 03/23/2019   Muscle weakness 03/23/2019   Vitamin D deficiency 03/23/2019   Bilateral carotid artery stenosis 12/15/2018   Moderate aortic valve stenosis 12/15/2018   BMI 45.0-49.9, adult (HCC) 11/10/2018   Cerebrovascular accident (HCC) 05/02/2016   Hemiparesis affecting right side as late effect of cerebrovascular accident (HCC) 05/01/2016   Brain tumor (HCC) 11/24/2014   Gastro-esophageal reflux disease without esophagitis 11/24/2014   Accident due to mechanical fall without injury 08/03/2014   Essential hypertension 06/13/2014   Mixed hyperlipidemia 06/13/2014   Unspecified sequelae of cerebral infarction 06/13/2014   Thyromegaly  01/07/2014   Type 2 diabetes mellitus without complication (HCC) 01/06/2014   Neck mass 09/08/2013   Swimmer's ear 09/08/2013   Erectile dysfunction 01/03/2013   Prediabetes 01/03/2013   ONSET DATE: 05/09/2016  REFERRING DIAG: X32.355 (ICD-10-CM) - Hemiplegia and hemiparesis following cerebral infarction affecting right dominant side   THERAPY DIAG:  Unsteadiness on feet  Difficulty in walking, not elsewhere classified  Other abnormalities of gait and mobility  Abnormality of gait and mobility  Muscle weakness (generalized)  Other lack of coordination  Rationale for Evaluation and Treatment Rehabilitation  SUBJECTIVE:  SUBJECTIVE STATEMENT  Pt reported feeling well today and no falls or changes since last visit. Pt reported 0/10 on NPS.   Pt accompanied by: self  PERTINENT HISTORY:   The pt is a pleasant 57 yo male who returns to PT for evaluation of deficits s/o old CVA. Pt with hx of CVA Feb 2018. He was previously fully independent, but CVA caused significant deficits, mainly affecting his right side. Pt reports continued difficulty with walking and transfers. Pt still using WC for majority of mobility, otherwise he uses his RW. Pt lives with his mom who is his main caregiver. Patient had outpatient PT previously in clinic in 2023. He reports since discharge he has been completing his HEP and walking about every day. Pt would like to improve his walking mainly, but also would like to work on balance. He also reports difficulty with transfers. Pt's mom and his caregiver helps him with ADLs. However, he says he is now waiting for a new caregiver. Prior to this pt had caregiver 3x/week. PMH significant for hx of brain tumor, past CVA, DMII and HTN. See chart for additional information.  PAIN:   Are you having pain? Pt reports no pain currently  PRECAUTIONS: fall  WEIGHT BEARING RESTRICTIONS none  PLOF: required assistance with ADLs (old CVA), prior to CVA in 2018 was indep.  History of Falls: Pt reports no recent falls  Home information: Pt lives with mother who is his primary caregiver. Previously had home aid, 3x/week but reports currently waiting for new one.  PATIENT GOALS: Pt would like to improve his ability to ambulate.  OBJECTIVE:  taken at [facility] unless otherwise noted:   DIAGNOSTIC FINDINGS: No recent imaging per chart  Objective measures taken at eval unless otherwise specified:   Posture: forward head posture, rounded shoulders  MMT LE: grossly  4+/5, exception hip flexors and abductors 4/5 each bilat   Functional tests:  5xSTS:  34 sec, use of BUE (multiple initial attempts with posterior LOB in chair)   : 0.096 m/s with RW, close CGA, and WC follow  Gait for distance (goal 150 ft): deferred due to time  Gait mechanics: impaired, decreased speed (see ), crouched posture, poor postural stability and relies heavily on BUE support on RW. Pt now with bilat AFOS   FOTO: 44 (goal 50)    TODAY'S TREATMENT  03/13/23  5xSTS test was administered to complete progress note from last visit (see below)  TE Nustep level 2 for speed ( 35 BPM ) x 6 minutes - LE only Nustep level 8 for glute and quad power 3 x 1 min rounds - LE only   TA Sit to stand from wheelchair and stand pivot transfer to NuStep   Ambulation with RW and WC follow (VC's for pt to utilize glute strength to come into standing):  1 round 50 feet 1 round 83 feet    End of session patient situated in wheelchair and leg rest donned following being doffed at initial phase of session.   Unless otherwise stated, CGA was provided and gait belt donned in order to ensure pt safety  PATIENT EDUCATION: Education details: Pt educated throughout session about proper posture and technique  with exercises. Improved exercise technique, movement at target joints, use of target muscles after min to mod verbal, visual, tactile cues.   Person educated: Patient Education method: Explanation, Demonstration, Tactile cues, and Verbal cues Education comprehension: verbalized understanding, returned demonstration, and verbal cues required  HOME EXERCISE PROGRAM:  Standing hip flexor stretch at counter, prolonged standing with erect posture for gluteal activation, HS curls with RTB   GOALS: Goals reviewed with patient? No  SHORT TERM GOALS: Target date: 11/12/2022  Pt will be independent with HEP in order to improve strength and balance in order to decrease fall risk and improve function at home. Baseline: instructed in HEP today, to provide printout next visit Goal status: MET    LONG TERM GOALS: Target date: 03/18/2023  Pt will increase FOTO to at least 50 in order to demonstrate improvement in function related to mobility and QoL. Baseline: 44 7/29:48 9/10:40 12/24/2022= 51 Goal status: MET  2.  Pt (< 60 yrs old) will complete 5xSTS test < 30 seconds indicating an increase in LE strength and improved balance. Baseline: 10/01/22 34 sec (multiple initial attempts with posterior LOB in chair) 12/20: 49 seconds 11/04/22:1:06, min A on multiple attempts to maintain balance and pt stays in flexed posture throughout test when coming to standing.   9/10: 27.5 sec, Pushes WC post consistently, close CGA  10/22: 47 seconds first attempt 12/5: 1 minute, 37 seconds, multiple attempts to get first stand, but no difficulty after that Goal status: ONGOING  3.  Pt will increase to > 0.5 m/s (30 sec) as to improve gait speed for better home ambulation and reduce fall risk. Baseline: 10/01/22 0.096 m/s with RW, close CGA, and WC follow previously 12/27: 0.11 m/s 11/04/22: .056 m/s  9/10: 076 m/s 10/22: 0.64 m/s  11/26: .076 m/s Goal status: ONGOING   4.  Pt will increase hip flexor and  abductor  gross strength to 4+/5 bilat as to improve functional strength for independent gait, increased standing tolerance, and increased ADL ability. Baseline: 10/01/22 4+/5, exception hip flexors and abductors 4/5 each bilat  (previously 03/27/22: 4+/5 ) 9/10: Hip flexor 4/5, hip abductor 4+/5 10/22: hip flexor 4/5 hip abductor 4+/5 11/26: hip flexor 4+/5 hip abductor 4+/5 Goal status: MET  5.  Pt will ambulate 150 ft with BRW and WC follow with no rest breaks to allow for improved mobility within home and increased independence. Baseline: 10/03/22 150' with BRW 04/03/22: 105 ft with BRW 7/29: 100 in 9:30 with pediatric RW 9/10:101 ft with BRW stooped posture worsened throughout test 12/24/2022- 150 feet using BRW, CGA with w/c follow- in 10 min 17 sec. Will keep goal to make sure he can do this consistently 10/22: 119 ft with BRW 8 min 23 seconds terminated by patient  11/26: 148 ft with BRW 14 min 34 seconds terminated by patient Goal status: ONGOING    ASSESSMENT :  CLINICAL IMPRESSION:  Pt tolerated all tasks during today's visit. Today's visit consisted of administering 5xSTS to complete progress note from previous visit. The pt was able to complete the test, unlike the previous time the test was administered, which is a positive progression. Pt was able to ambulate 50 feet and 83 feet with a therapeutic rest break in between, due to fatigue. Pt stated he felt more fatigued this visit than usual, which most likely contributed to the pt having to cancel previous scheduled visit. Pt will continue to benefit from skilled physical therapy intervention to address impairments, improve QOL, and attain therapy goals.    OBJECTIVE IMPAIRMENTS Abnormal gait, decreased activity tolerance, decreased balance, decreased coordination, decreased endurance, decreased mobility, difficulty walking, decreased ROM, decreased strength, improper body mechanics, postural dysfunction, and obesity.   ACTIVITY  LIMITATIONS carrying, lifting, bending, sitting, standing, squatting, stairs, transfers, bed  mobility, continence, bathing, toileting, dressing, hygiene/grooming, and locomotion level  PARTICIPATION LIMITATIONS: meal prep, cleaning, laundry, interpersonal relationship, driving, shopping, community activity, and yard work  PERSONAL FACTORS Fitness, Past/current experiences, Time since onset of injury/illness/exacerbation, and 3+ comorbidities: hx of brain tumor, past CVA, HTN, obesity, learning disability, DMII  are also affecting patient's functional outcome.   REHAB POTENTIAL: Fair    CLINICAL DECISION MAKING: Stable/uncomplicated  EVALUATION COMPLEXITY: Low  PLAN: PT FREQUENCY: 1-2x/week  PT DURATION: 12 weeks  PLANNED INTERVENTIONS: Therapeutic exercises, Therapeutic activity, Neuromuscular re-education, Balance training, Gait training, Patient/Family education, Joint mobilization, Stair training, Vestibular training, Orthotic/Fit training, DME instructions, Dry Needling, Electrical stimulation, Wheelchair mobility training, Cryotherapy, Moist heat, Compression bandaging, Manual therapy, and Re-evaluation  PLAN FOR NEXT SESSION:  Reassess 5xSTS, As much as possible / is tolerated have patient stand with good posture during his rest breaks. continue with strengthening exercises of the LE's, Functional ambulation when appropriate.  Debara Pickett, SPT  This entire session was performed under direct supervision and direction of a licensed Estate agent . I have personally read, edited and approve of the note as written.   Lenda Kelp PT Physical Therapist- [place]  [facility]

## 2023-03-18 ENCOUNTER — Ambulatory Visit: Payer: 59

## 2023-03-18 DIAGNOSIS — R2681 Unsteadiness on feet: Secondary | ICD-10-CM

## 2023-03-18 DIAGNOSIS — R262 Difficulty in walking, not elsewhere classified: Secondary | ICD-10-CM

## 2023-03-18 DIAGNOSIS — M6281 Muscle weakness (generalized): Secondary | ICD-10-CM

## 2023-03-18 DIAGNOSIS — R278 Other lack of coordination: Secondary | ICD-10-CM

## 2023-03-18 DIAGNOSIS — R269 Unspecified abnormalities of gait and mobility: Secondary | ICD-10-CM

## 2023-03-18 DIAGNOSIS — R2689 Other abnormalities of gait and mobility: Secondary | ICD-10-CM

## 2023-03-18 NOTE — Therapy (Signed)
OUTPATIENT PHYSICAL THERAPY NEURO TREATMENT    Patient Name: Curtis Powell MRN: [id number] DOB:[birth year], 57 y.o., male Today's Date: 03/18/2023  PCP: Enid Baas, MD  REFERRING PROVIDER: Enid Baas, MD    PT End of Session - 03/18/23 1309     Visit Number 42    Number of Visits 46    Date for PT Re-Evaluation 03/18/23    Progress Note Due on Visit 50    PT Start Time 1315    PT Stop Time 1359    PT Time Calculation (min) 44 min    Equipment Utilized During Treatment Gait belt    Activity Tolerance Patient tolerated treatment well;No increased pain    Behavior During Therapy WFL for tasks assessed/performed                              Past Medical History:  Diagnosis Date   Asthma    GERD (gastroesophageal reflux disease)    Hyperlipidemia    Hypertension    Obesity    Stroke [facility])    Past Surgical History:  Procedure Laterality Date   BRAIN SURGERY     Patient Active Problem List   Diagnosis Date Noted   Impaired glucose tolerance 03/23/2020   Learning disability 03/23/2020   Urinary and fecal incontinence 06/14/2019   Chronic pain syndrome 03/23/2019   Constipation 03/23/2019   Dysarthria 03/23/2019   Dysphagia 03/23/2019   Learning difficulty 03/23/2019   Morbid obesity (HCC) 03/23/2019   Muscle weakness 03/23/2019   Vitamin D deficiency 03/23/2019   Bilateral carotid artery stenosis 12/15/2018   Moderate aortic valve stenosis 12/15/2018   BMI 45.0-49.9, adult (HCC) 11/10/2018   Cerebrovascular accident (HCC) 05/02/2016   Hemiparesis affecting right side as late effect of cerebrovascular accident (HCC) 05/01/2016   Brain tumor (HCC) 11/24/2014   Gastro-esophageal reflux disease without esophagitis 11/24/2014   Accident due to mechanical fall without injury 08/03/2014   Essential hypertension 06/13/2014   Mixed hyperlipidemia 06/13/2014   Unspecified sequelae of cerebral infarction 06/13/2014   Thyromegaly  01/07/2014   Type 2 diabetes mellitus without complication (HCC) 01/06/2014   Neck mass 09/08/2013   Swimmer's ear 09/08/2013   Erectile dysfunction 01/03/2013   Prediabetes 01/03/2013   ONSET DATE: 05/09/2016  REFERRING DIAG: A21.308 (ICD-10-CM) - Hemiplegia and hemiparesis following cerebral infarction affecting right dominant side   THERAPY DIAG:  Unsteadiness on feet  Difficulty in walking, not elsewhere classified  Other abnormalities of gait and mobility  Abnormality of gait and mobility  Muscle weakness (generalized)  Other lack of coordination  Rationale for Evaluation and Treatment Rehabilitation  SUBJECTIVE:  SUBJECTIVE STATEMENT  Pt reported feeling well today and no falls or changes since last visit. Pt reported 0/10 on NPS.   Pt accompanied by: self  PERTINENT HISTORY:   The pt is a pleasant 57 yo male who returns to PT for evaluation of deficits s/o old CVA. Pt with hx of CVA Feb 2018. He was previously fully independent, but CVA caused significant deficits, mainly affecting his right side. Pt reports continued difficulty with walking and transfers. Pt still using WC for majority of mobility, otherwise he uses his RW. Pt lives with his mom who is his main caregiver. Patient had outpatient PT previously in clinic in 2023. He reports since discharge he has been completing his HEP and walking about every day. Pt would like to improve his walking mainly, but also would like to work on balance. He also reports difficulty with transfers. Pt's mom and his caregiver helps him with ADLs. However, he says he is now waiting for a new caregiver. Prior to this pt had caregiver 3x/week. PMH significant for hx of brain tumor, past CVA, DMII and HTN. See chart for additional information.  PAIN:   Are you having pain? Pt reports no pain currently  PRECAUTIONS: fall  WEIGHT BEARING RESTRICTIONS none  PLOF: required assistance with ADLs (old CVA), prior to CVA in 2018 was indep.  History of Falls: Pt reports no recent falls  Home information: Pt lives with mother who is his primary caregiver. Previously had home aid, 3x/week but reports currently waiting for new one.  PATIENT GOALS: Pt would like to improve his ability to ambulate.  OBJECTIVE:  taken at [facility] unless otherwise noted:   DIAGNOSTIC FINDINGS: No recent imaging per chart  Objective measures taken at eval unless otherwise specified:   Posture: forward head posture, rounded shoulders  MMT LE: grossly  4+/5, exception hip flexors and abductors 4/5 each bilat   Functional tests:  5xSTS:  34 sec, use of BUE (multiple initial attempts with posterior LOB in chair)   : 0.096 m/s with RW, close CGA, and WC follow  Gait for distance (goal 150 ft): deferred due to time  Gait mechanics: impaired, decreased speed (see ), crouched posture, poor postural stability and relies heavily on BUE support on RW. Pt now with bilat AFOS   FOTO: 44 (goal 50)    TODAY'S TREATMENT  03/18/23  TE Nustep level 2 for speed ( 35 BPM ) x 6 minutes - LE only Nustep level 8 for glute and quad power 3 x 1 min rounds - LE only   TA Sit to stand from wheelchair and stand pivot transfer to NuStep   Ambulation with RW and WC follow (VC's for pt to utilize glute strength to come into standing):  1 round 54 feet 1 round 72 feet    End of session patient situated in wheelchair and leg rest donned following being doffed at initial phase of session.   Unless otherwise stated, CGA was provided and gait belt donned in order to ensure pt safety  PATIENT EDUCATION: Education details: Pt educated throughout session about proper posture and technique with exercises. Improved exercise technique, movement at target joints, use of  target muscles after min to mod verbal, visual, tactile cues.   Person educated: Patient Education method: Explanation, Demonstration, Tactile cues, and Verbal cues Education comprehension: verbalized understanding, returned demonstration, and verbal cues required  HOME EXERCISE PROGRAM:  Standing hip flexor stretch at counter, prolonged standing with erect posture for gluteal activation,  HS curls with RTB   GOALS: Goals reviewed with patient? No  SHORT TERM GOALS: Target date: 11/12/2022  Pt will be independent with HEP in order to improve strength and balance in order to decrease fall risk and improve function at home. Baseline: instructed in HEP today, to provide printout next visit Goal status: MET    LONG TERM GOALS: Target date: 03/18/2023  Pt will increase FOTO to at least 50 in order to demonstrate improvement in function related to mobility and QoL. Baseline: 44 7/29:48 9/10:40 12/24/2022= 51 Goal status: MET  2.  Pt (< 60 yrs old) will complete 5xSTS test < 30 seconds indicating an increase in LE strength and improved balance. Baseline: 10/01/22 34 sec (multiple initial attempts with posterior LOB in chair) 12/20: 49 seconds 11/04/22:1:06, min A on multiple attempts to maintain balance and pt stays in flexed posture throughout test when coming to standing.   9/10: 27.5 sec, Pushes WC post consistently, close CGA  10/22: 47 seconds first attempt 12/5: 1 minute, 37 seconds, multiple attempts to get first stand, but no difficulty after that Goal status: ONGOING  3.  Pt will increase to > 0.5 m/s (30 sec) as to improve gait speed for better home ambulation and reduce fall risk. Baseline: 10/01/22 0.096 m/s with RW, close CGA, and WC follow previously 12/27: 0.11 m/s 11/04/22: .056 m/s  9/10: 076 m/s 10/22: 0.64 m/s  11/26: .076 m/s Goal status: ONGOING   4.  Pt will increase hip flexor and abductor  gross strength to 4+/5 bilat as to improve functional strength for  independent gait, increased standing tolerance, and increased ADL ability. Baseline: 10/01/22 4+/5, exception hip flexors and abductors 4/5 each bilat  (previously 03/27/22: 4+/5 ) 9/10: Hip flexor 4/5, hip abductor 4+/5 10/22: hip flexor 4/5 hip abductor 4+/5 11/26: hip flexor 4+/5 hip abductor 4+/5 Goal status: MET  5.  Pt will ambulate 150 ft with BRW and WC follow with no rest breaks to allow for improved mobility within home and increased independence. Baseline: 10/03/22 150' with BRW 04/03/22: 105 ft with BRW 7/29: 100 in 9:30 with pediatric RW 9/10:101 ft with BRW stooped posture worsened throughout test 12/24/2022- 150 feet using BRW, CGA with w/c follow- in 10 min 17 sec. Will keep goal to make sure he can do this consistently 10/22: 119 ft with BRW 8 min 23 seconds terminated by patient  11/26: 148 ft with BRW 14 min 34 seconds terminated by patient Goal status: ONGOING    ASSESSMENT :  CLINICAL IMPRESSION:  Pt tolerated all tasks during today's visit. Pt ambulated a slightly decreased distance this visit, due to reports of fatigue in RLE from the pt. Pt reported doing exercises at home and was encouraged by PT to continue to exercise at home when he is not in PT. Pt will continue to benefit from skilled physical therapy intervention to address impairments, improve QOL, and attain therapy goals.    OBJECTIVE IMPAIRMENTS Abnormal gait, decreased activity tolerance, decreased balance, decreased coordination, decreased endurance, decreased mobility, difficulty walking, decreased ROM, decreased strength, improper body mechanics, postural dysfunction, and obesity.   ACTIVITY LIMITATIONS carrying, lifting, bending, sitting, standing, squatting, stairs, transfers, bed mobility, continence, bathing, toileting, dressing, hygiene/grooming, and locomotion level  PARTICIPATION LIMITATIONS: meal prep, cleaning, laundry, interpersonal relationship, driving, shopping, community activity, and yard  work  PERSONAL FACTORS Fitness, Past/current experiences, Time since onset of injury/illness/exacerbation, and 3+ comorbidities: hx of brain tumor, past CVA, HTN, obesity, learning disability, DMII  are also affecting patient's functional outcome.   REHAB POTENTIAL: Fair    CLINICAL DECISION MAKING: Stable/uncomplicated  EVALUATION COMPLEXITY: Low  PLAN: PT FREQUENCY: 1-2x/week  PT DURATION: 12 weeks  PLANNED INTERVENTIONS: Therapeutic exercises, Therapeutic activity, Neuromuscular re-education, Balance training, Gait training, Patient/Family education, Joint mobilization, Stair training, Vestibular training, Orthotic/Fit training, DME instructions, Dry Needling, Electrical stimulation, Wheelchair mobility training, Cryotherapy, Moist heat, Compression bandaging, Manual therapy, and Re-evaluation  PLAN FOR NEXT SESSION:  As much as possible / is tolerated have patient stand with good posture during his rest breaks. continue with strengthening exercises of the LE's, Functional ambulation when appropriate.  Debara Pickett, SPT  This entire session was performed under direct supervision and direction of a licensed Estate agent . I have personally read, edited and approve of the note as written.   Lenda Kelp PT Physical Therapist- [place]  [facility]

## 2023-03-20 ENCOUNTER — Ambulatory Visit: Payer: 59

## 2023-03-20 DIAGNOSIS — R531 Weakness: Secondary | ICD-10-CM

## 2023-03-20 DIAGNOSIS — R269 Unspecified abnormalities of gait and mobility: Secondary | ICD-10-CM

## 2023-03-20 DIAGNOSIS — R278 Other lack of coordination: Secondary | ICD-10-CM

## 2023-03-20 DIAGNOSIS — R296 Repeated falls: Secondary | ICD-10-CM

## 2023-03-20 DIAGNOSIS — R2681 Unsteadiness on feet: Secondary | ICD-10-CM | POA: Diagnosis not present

## 2023-03-20 DIAGNOSIS — I69351 Hemiplegia and hemiparesis following cerebral infarction affecting right dominant side: Secondary | ICD-10-CM

## 2023-03-20 DIAGNOSIS — R262 Difficulty in walking, not elsewhere classified: Secondary | ICD-10-CM

## 2023-03-20 DIAGNOSIS — M6281 Muscle weakness (generalized): Secondary | ICD-10-CM

## 2023-03-20 DIAGNOSIS — R2689 Other abnormalities of gait and mobility: Secondary | ICD-10-CM

## 2023-03-20 NOTE — Therapy (Signed)
OUTPATIENT PHYSICAL THERAPY NEURO TREATMENT/RECERT   Patient Name: Curtis Powell MRN: [id number] DOB:12/03/1965, 57 y.o., male Today's Date: 03/20/2023  PCP: Enid Baas, MD  REFERRING PROVIDER: Enid Baas, MD    PT End of Session - 03/20/23 1330     Visit Number 43    Number of Visits 67    Date for PT Re-Evaluation 06/12/23    Progress Note Due on Visit 50    PT Start Time 1317    Equipment Utilized During Treatment Gait belt    Activity Tolerance Patient tolerated treatment well;No increased pain    Behavior During Therapy WFL for tasks assessed/performed                               Past Medical History:  Diagnosis Date   Asthma    GERD (gastroesophageal reflux disease)    Hyperlipidemia    Hypertension    Obesity    Stroke [facility])    Past Surgical History:  Procedure Laterality Date   BRAIN SURGERY     Patient Active Problem List   Diagnosis Date Noted   Impaired glucose tolerance 03/23/2020   Learning disability 03/23/2020   Urinary and fecal incontinence 06/14/2019   Chronic pain syndrome 03/23/2019   Constipation 03/23/2019   Dysarthria 03/23/2019   Dysphagia 03/23/2019   Learning difficulty 03/23/2019   Morbid obesity (HCC) 03/23/2019   Muscle weakness 03/23/2019   Vitamin D deficiency 03/23/2019   Bilateral carotid artery stenosis 12/15/2018   Moderate aortic valve stenosis 12/15/2018   BMI 45.0-49.9, adult (HCC) 11/10/2018   Cerebrovascular accident (HCC) 05/02/2016   Hemiparesis affecting right side as late effect of cerebrovascular accident (HCC) 05/01/2016   Brain tumor (HCC) 11/24/2014   Gastro-esophageal reflux disease without esophagitis 11/24/2014   Accident due to mechanical fall without injury 08/03/2014   Essential hypertension 06/13/2014   Mixed hyperlipidemia 06/13/2014   Unspecified sequelae of cerebral infarction 06/13/2014   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/2014   Type 2 diabetes mellitus without  complication (HCC) 01/06/2014   Neck mass 09/08/2013   Swimmer's ear 09/08/2013   Erectile dysfunction 01/03/2013   Prediabetes 01/03/2013   ONSET DATE: 05/09/2016  REFERRING DIAG: W09.811 (ICD-10-CM) - Hemiplegia and hemiparesis following cerebral infarction affecting right dominant side   THERAPY DIAG:  Unsteadiness on feet  Difficulty in walking, not elsewhere classified  Other abnormalities of gait and mobility  Abnormality of gait and mobility  Muscle weakness (generalized)  Other lack of coordination  Repeated falls  Left-sided weakness  Hemiplegia and hemiparesis following cerebral infarction affecting right dominant side (HCC)  Rationale for Evaluation and Treatment Rehabilitation  SUBJECTIVE:  SUBJECTIVE STATEMENT  Pt reported not too fatigued after last visit and working on HEP   Pt accompanied by: self  PERTINENT HISTORY:   The pt is a pleasant 57 yo male who returns to PT for evaluation of deficits s/o old CVA. Pt with hx of CVA Feb 2018. He was previously fully independent, but CVA caused significant deficits, mainly affecting his right side. Pt reports continued difficulty with walking and transfers. Pt still using WC for majority of mobility, otherwise he uses his RW. Pt lives with his mom who is his main caregiver. Patient had outpatient PT previously in clinic in 2023. He reports since discharge he has been completing his HEP and walking about every day. Pt would like to improve his walking mainly, but also would like to work on balance. He also reports difficulty with transfers. Pt's mom and his caregiver helps him with ADLs. However, he says he is now waiting for a new caregiver. Prior to this pt had caregiver 3x/week. PMH significant for hx of brain tumor, past CVA, DMII  and HTN. See chart for additional information.  PAIN:  Are you having pain? Pt reports no pain currently  PRECAUTIONS: fall  WEIGHT BEARING RESTRICTIONS none  PLOF: required assistance with ADLs (old CVA), prior to CVA in 2018 was indep.  History of Falls: Pt reports no recent falls  Home information: Pt lives with mother who is his primary caregiver. Previously had home aid, 3x/week but reports currently waiting for new one.  PATIENT GOALS: Pt would like to improve his ability to ambulate.  OBJECTIVE:  taken at [facility] unless otherwise noted:   DIAGNOSTIC FINDINGS: No recent imaging per chart  Objective measures taken at eval unless otherwise specified:   Posture: forward head posture, rounded shoulders  MMT LE: grossly  4+/5, exception hip flexors and abductors 4/5 each bilat   Functional tests:  5xSTS:  34 sec, use of BUE (multiple initial attempts with posterior LOB in chair)   : 0.096 m/s with RW, close CGA, and WC follow  Gait for distance (goal 150 ft): deferred due to time  Gait mechanics: impaired, decreased speed (see ), crouched posture, poor postural stability and relies heavily on BUE support on RW. Pt now with bilat AFOS   FOTO: 44 (goal 50)    TODAY'S TREATMENT  03/20/23 Physical therapy treatment session today consisted of completing assessment of goals and administration of testing as demonstrated and documented in flow sheet, treatment, and goals section of this note. Addition treatments may be found below. Unless otherwise stated, CGA was provided and gait belt donned in order to ensure pt safety.     PATIENT EDUCATION: Education details: Pt educated throughout session about proper posture and technique with exercises. Improved exercise technique, movement at target joints, use of target muscles after min to mod verbal, visual, tactile cues.   Person educated: Patient Education method: Explanation, Demonstration, Tactile cues, and Verbal  cues Education comprehension: verbalized understanding, returned demonstration, and verbal cues required  HOME EXERCISE PROGRAM:  Standing hip flexor stretch at counter, prolonged standing with erect posture for gluteal activation, HS curls with RTB   GOALS: Goals reviewed with patient? No  SHORT TERM GOALS: Target date: 11/12/2022  Pt will be independent with HEP in order to improve strength and balance in order to decrease fall risk and improve function at home. Baseline: instructed in HEP today, to provide printout next visit Goal status: MET    LONG TERM GOALS: Target date: 06/12/2023  Pt will increase FOTO to at least 50 in order to demonstrate improvement in function related to mobility and QoL. Baseline: 44 7/29:48 9/10:40 12/24/2022= 51 Goal status: MET  2.  Pt (< 60 yrs old) will complete 5xSTS test < 30 seconds indicating an increase in LE strength and improved balance. Baseline: 10/01/22 34 sec (multiple initial attempts with posterior LOB in chair) 12/20: 49 seconds 11/04/22:1:06, min A on multiple attempts to maintain balance and pt stays in flexed posture throughout test when coming to standing.   9/10: 27.5 sec, Pushes WC post consistently, close CGA  10/22: 47 seconds first attempt 12/5: 1 minute, 37 seconds, multiple attempts to get first stand, but no difficulty after that; 03/20/2023= 1 min 2 sec with 1UE on walker and 1 on armrest- some posterior lean- Close CGA Goal status: ONGOING  3.  Pt will increase to > 0.5 m/s (30 sec) as to improve gait speed for better home ambulation and reduce fall risk. Baseline: 10/01/22 0.096 m/s with RW, close CGA, and WC follow previously 12/27: 0.11 m/s 11/04/22: .056 m/s  9/10: 076 m/s 10/22: 0.64 m/s  11/26: .076 m/s; 03/20/2023= 0.08 m/s Goal status: ONGOING   4.  Pt will increase hip flexor and abductor  gross strength to 4+/5 bilat as to improve functional strength for independent gait, increased standing tolerance, and  increased ADL ability. Baseline: 10/01/22 4+/5, exception hip flexors and abductors 4/5 each bilat  (previously 03/27/22: 4+/5 ) 9/10: Hip flexor 4/5, hip abductor 4+/5 10/22: hip flexor 4/5 hip abductor 4+/5 11/26: hip flexor 4+/5 hip abductor 4+/5 Goal status: MET  5.  Pt will ambulate 150 ft with BRW and WC follow with no rest breaks in less than 12 min to allow for improved mobility within home and increased independence. Baseline: 10/03/22 150' with BRW 04/03/22: 105 ft with BRW 7/29: 100 in 9:30 with pediatric RW 9/10:101 ft with BRW stooped posture worsened throughout test 12/24/2022- 150 feet using BRW, CGA with w/c follow- in 10 min 17 sec. Will keep goal to make sure he can do this consistently 10/22: 119 ft with BRW 8 min 23 seconds terminated by patient  11/26: 148 ft with BRW 14 min 34 seconds terminated by patient 03/20/2023= 152 feet using BRW, CGA and w/c follow in 14 min 3 sec- Need to keep goal active to ensure consistency but will revised and add a time element to goal.  Goal status: Progressing and Revised    ASSESSMENT :  CLINICAL IMPRESSION:  Patient presented with good motivation for today's recert visit. He worked hard- performing his best effort. He was able to stand with less overall posterior lean and only CGA  with some improved overall time with 5xSTS. He was able to move LE better for more height with hip march and knee ext and finished up with walking endurance. He walked as far as he could go til his legs gave out and managed 152 feet today. VC for cadence and after 100 feet he slows down tremendously and poor step sequencing but determining to finish and put forth his best effort. His goals are still appropriate and based on his improvement he still has the potential to improve in response to therapy. Maximum improvement is yet to be obtained. The anticipated improvement is attainable and reasonable in a generally predictable time. Pt will continue to benefit from  skilled physical therapy intervention to address impairments, improve QOL, and attain therapy goals.    OBJECTIVE IMPAIRMENTS Abnormal  gait, decreased activity tolerance, decreased balance, decreased coordination, decreased endurance, decreased mobility, difficulty walking, decreased ROM, decreased strength, improper body mechanics, postural dysfunction, and obesity.   ACTIVITY LIMITATIONS carrying, lifting, bending, sitting, standing, squatting, stairs, transfers, bed mobility, continence, bathing, toileting, dressing, hygiene/grooming, and locomotion level  PARTICIPATION LIMITATIONS: meal prep, cleaning, laundry, interpersonal relationship, driving, shopping, community activity, and yard work  PERSONAL FACTORS Fitness, Past/current experiences, Time since onset of injury/illness/exacerbation, and 3+ comorbidities: hx of brain tumor, past CVA, HTN, obesity, learning disability, DMII  are also affecting patient's functional outcome.   REHAB POTENTIAL: Fair    CLINICAL DECISION MAKING: Stable/uncomplicated  EVALUATION COMPLEXITY: Low  PLAN: PT FREQUENCY: 1-2x/week  PT DURATION: 12 weeks  PLANNED INTERVENTIONS: Therapeutic exercises, Therapeutic activity, Neuromuscular re-education, Balance training, Gait training, Patient/Family education, Joint mobilization, Stair training, Vestibular training, Orthotic/Fit training, DME instructions, Dry Needling, Electrical stimulation, Wheelchair mobility training, Cryotherapy, Moist heat, Compression bandaging, Manual therapy, and Re-evaluation  PLAN FOR NEXT SESSION:  As much as possible / is tolerated have patient stand with good posture during his rest breaks. continue with strengthening exercises of the LE's, Functional ambulation when appropriate.   Lenda Kelp PT Physical Therapist- [place]  [facility]

## 2023-03-25 ENCOUNTER — Ambulatory Visit: Payer: 59

## 2023-03-25 DIAGNOSIS — R2681 Unsteadiness on feet: Secondary | ICD-10-CM | POA: Diagnosis not present

## 2023-03-25 DIAGNOSIS — M6281 Muscle weakness (generalized): Secondary | ICD-10-CM

## 2023-03-25 DIAGNOSIS — R262 Difficulty in walking, not elsewhere classified: Secondary | ICD-10-CM

## 2023-03-25 DIAGNOSIS — R2689 Other abnormalities of gait and mobility: Secondary | ICD-10-CM

## 2023-03-25 DIAGNOSIS — R278 Other lack of coordination: Secondary | ICD-10-CM

## 2023-03-25 DIAGNOSIS — R269 Unspecified abnormalities of gait and mobility: Secondary | ICD-10-CM

## 2023-03-25 NOTE — Therapy (Signed)
OUTPATIENT PHYSICAL THERAPY NEURO TREATMENT   Patient Name: Curtis Powell MRN: [id number] DOB:11-15-1965, 57 y.o., male Today's Date: 03/25/2023  PCP: Enid Baas, MD  REFERRING PROVIDER: Enid Baas, MD    PT End of Session - 03/25/23 1316     Visit Number 44    Number of Visits 67    Date for PT Re-Evaluation 06/12/23    Progress Note Due on Visit 50    PT Start Time 1316    PT Stop Time 1359    PT Time Calculation (min) 43 min    Equipment Utilized During Treatment Gait belt    Activity Tolerance Patient tolerated treatment well;No increased pain    Behavior During Therapy WFL for tasks assessed/performed                               Past Medical History:  Diagnosis Date   Asthma    GERD (gastroesophageal reflux disease)    Hyperlipidemia    Hypertension    Obesity    Stroke [facility])    Past Surgical History:  Procedure Laterality Date   BRAIN SURGERY     Patient Active Problem List   Diagnosis Date Noted   Impaired glucose tolerance 03/23/2020   Learning disability 03/23/2020   Urinary and fecal incontinence 06/14/2019   Chronic pain syndrome 03/23/2019   Constipation 03/23/2019   Dysarthria 03/23/2019   Dysphagia 03/23/2019   Learning difficulty 03/23/2019   Morbid obesity (HCC) 03/23/2019   Muscle weakness 03/23/2019   Vitamin D deficiency 03/23/2019   Bilateral carotid artery stenosis 12/15/2018   Moderate aortic valve stenosis 12/15/2018   BMI 45.0-49.9, adult (HCC) 11/10/2018   Cerebrovascular accident (HCC) 05/02/2016   Hemiparesis affecting right side as late effect of cerebrovascular accident (HCC) 05/01/2016   Brain tumor (HCC) 11/24/2014   Gastro-esophageal reflux disease without esophagitis 11/24/2014   Accident due to mechanical fall without injury 08/03/2014   Essential hypertension 06/13/2014   Mixed hyperlipidemia 06/13/2014   Unspecified sequelae of cerebral infarction 06/13/2014   Thyromegaly  01/07/2014   Type 2 diabetes mellitus without complication (HCC) 01/06/2014   Neck mass 09/08/2013   Swimmer's ear 09/08/2013   Erectile dysfunction 01/03/2013   Prediabetes 01/03/2013   ONSET DATE: 05/09/2016  REFERRING DIAG: N82.956 (ICD-10-CM) - Hemiplegia and hemiparesis following cerebral infarction affecting right dominant side   THERAPY DIAG:  Unsteadiness on feet  Difficulty in walking, not elsewhere classified  Other abnormalities of gait and mobility  Abnormality of gait and mobility  Muscle weakness (generalized)  Other lack of coordination  Rationale for Evaluation and Treatment Rehabilitation  SUBJECTIVE:  SUBJECTIVE STATEMENT  Pt reported having a good week so far and no pain and no report of any falls.   Pt accompanied by: self  PERTINENT HISTORY:   The pt is a pleasant 57 yo male who returns to PT for evaluation of deficits s/o old CVA. Pt with hx of CVA Feb 2018. He was previously fully independent, but CVA caused significant deficits, mainly affecting his right side. Pt reports continued difficulty with walking and transfers. Pt still using WC for majority of mobility, otherwise he uses his RW. Pt lives with his mom who is his main caregiver. Patient had outpatient PT previously in clinic in 2023. He reports since discharge he has been completing his HEP and walking about every day. Pt would like to improve his walking mainly, but also would like to work on balance. He also reports difficulty with transfers. Pt's mom and his caregiver helps him with ADLs. However, he says he is now waiting for a new caregiver. Prior to this pt had caregiver 3x/week. PMH significant for hx of brain tumor, past CVA, DMII and HTN. See chart for additional information.  PAIN:  Are you having pain?  Pt reports no pain currently  PRECAUTIONS: fall  WEIGHT BEARING RESTRICTIONS none  PLOF: required assistance with ADLs (old CVA), prior to CVA in 2018 was indep.  History of Falls: Pt reports no recent falls  Home information: Pt lives with mother who is his primary caregiver. Previously had home aid, 3x/week but reports currently waiting for new one.  PATIENT GOALS: Pt would like to improve his ability to ambulate.  OBJECTIVE:  taken at [facility] unless otherwise noted:   DIAGNOSTIC FINDINGS: No recent imaging per chart  Objective measures taken at eval unless otherwise specified:   Posture: forward head posture, rounded shoulders  MMT LE: grossly  4+/5, exception hip flexors and abductors 4/5 each bilat   Functional tests:  5xSTS:  34 sec, use of BUE (multiple initial attempts with posterior LOB in chair)   : 0.096 m/s with RW, close CGA, and WC follow  Gait for distance (goal 150 ft): deferred due to time  Gait mechanics: impaired, decreased speed (see ), crouched posture, poor postural stability and relies heavily on BUE support on RW. Pt now with bilat AFOS   FOTO: 44 (goal 50)    TODAY'S TREATMENT  03/25/23   THEREX:   Seated hip march - 1 set of 12 reps without weights and 1 set of 12 reps with 2# AW Seated knee ext- 2 sets of 12 reps with 2# AW alt LE's  Seated gluteal sets - hold 5 sec  x 10  Scooting to edge x 5 Chair dips (VC for hand position) 2 sets of 10 reps Manual resistive hip abd/ADD 2 sets of 10 reps with 3 sec hold  Anterior/posterior weight shift from w/c 2 sets of 10 reps (VC to lean more anteriorly) Forward/retro gait in clinic using bariatric RW x 15 feet (Increased time required with retro gait today with minimal step length and only 1 round due to Right LE fatigue at end of session)      PATIENT EDUCATION: Education details: Pt educated throughout session about proper posture and technique with exercises. Improved exercise  technique, movement at target joints, use of target muscles after min to mod verbal, visual, tactile cues.   Person educated: Patient Education method: Explanation, Demonstration, Tactile cues, and Verbal cues Education comprehension: verbalized understanding, returned demonstration, and verbal cues required  HOME  EXERCISE PROGRAM:  Standing hip flexor stretch at counter, prolonged standing with erect posture for gluteal activation, HS curls with RTB   GOALS: Goals reviewed with patient? No  SHORT TERM GOALS: Target date: 11/12/2022  Pt will be independent with HEP in order to improve strength and balance in order to decrease fall risk and improve function at home. Baseline: instructed in HEP today, to provide printout next visit Goal status: MET    LONG TERM GOALS: Target date: 06/12/2023  Pt will increase FOTO to at least 50 in order to demonstrate improvement in function related to mobility and QoL. Baseline: 44 7/29:48 9/10:40 12/24/2022= 51 Goal status: MET  2.  Pt (< 60 yrs old) will complete 5xSTS test < 30 seconds indicating an increase in LE strength and improved balance. Baseline: 10/01/22 34 sec (multiple initial attempts with posterior LOB in chair) 12/20: 49 seconds 11/04/22:1:06, min A on multiple attempts to maintain balance and pt stays in flexed posture throughout test when coming to standing.   9/10: 27.5 sec, Pushes WC post consistently, close CGA  10/22: 47 seconds first attempt 12/5: 1 minute, 37 seconds, multiple attempts to get first stand, but no difficulty after that; 03/20/2023= 1 min 2 sec with 1UE on walker and 1 on armrest- some posterior lean- Close CGA Goal status: ONGOING  3.  Pt will increase to > 0.5 m/s (30 sec) as to improve gait speed for better home ambulation and reduce fall risk. Baseline: 10/01/22 0.096 m/s with RW, close CGA, and WC follow previously 12/27: 0.11 m/s 11/04/22: .056 m/s  9/10: 076 m/s 10/22: 0.64 m/s  11/26: .076 m/s;  03/20/2023= 0.08 m/s Goal status: ONGOING   4.  Pt will increase hip flexor and abductor  gross strength to 4+/5 bilat as to improve functional strength for independent gait, increased standing tolerance, and increased ADL ability. Baseline: 10/01/22 4+/5, exception hip flexors and abductors 4/5 each bilat  (previously 03/27/22: 4+/5 ) 9/10: Hip flexor 4/5, hip abductor 4+/5 10/22: hip flexor 4/5 hip abductor 4+/5 11/26: hip flexor 4+/5 hip abductor 4+/5 Goal status: MET  5.  Pt will ambulate 150 ft with BRW and WC follow with no rest breaks in less than 12 min to allow for improved mobility within home and increased independence. Baseline: 10/03/22 150' with BRW 04/03/22: 105 ft with BRW 7/29: 100 in 9:30 with pediatric RW 9/10:101 ft with BRW stooped posture worsened throughout test 12/24/2022- 150 feet using BRW, CGA with w/c follow- in 10 min 17 sec. Will keep goal to make sure he can do this consistently 10/22: 119 ft with BRW 8 min 23 seconds terminated by patient  11/26: 148 ft with BRW 14 min 34 seconds terminated by patient 03/20/2023= 152 feet using BRW, CGA and w/c follow in 14 min 3 sec- Need to keep goal active to ensure consistency but will revised and add a time element to goal.  Goal status: Progressing and Revised    ASSESSMENT :  CLINICAL IMPRESSION:  Treatment focused on working on LE strength including muscles design to assist with standing. He continues to present with fatigue in session and after all therex experienced difficulty with standing and walking backward today. His right LE fatigued to the point where he could not take another step backward today. Pt will continue to benefit from skilled physical therapy intervention to address impairments, improve QOL, and attain therapy goals.    OBJECTIVE IMPAIRMENTS Abnormal gait, decreased activity tolerance, decreased balance, decreased coordination, decreased  endurance, decreased mobility, difficulty walking, decreased  ROM, decreased strength, improper body mechanics, postural dysfunction, and obesity.   ACTIVITY LIMITATIONS carrying, lifting, bending, sitting, standing, squatting, stairs, transfers, bed mobility, continence, bathing, toileting, dressing, hygiene/grooming, and locomotion level  PARTICIPATION LIMITATIONS: meal prep, cleaning, laundry, interpersonal relationship, driving, shopping, community activity, and yard work  PERSONAL FACTORS Fitness, Past/current experiences, Time since onset of injury/illness/exacerbation, and 3+ comorbidities: hx of brain tumor, past CVA, HTN, obesity, learning disability, DMII  are also affecting patient's functional outcome.   REHAB POTENTIAL: Fair    CLINICAL DECISION MAKING: Stable/uncomplicated  EVALUATION COMPLEXITY: Low  PLAN: PT FREQUENCY: 1-2x/week  PT DURATION: 12 weeks  PLANNED INTERVENTIONS: Therapeutic exercises, Therapeutic activity, Neuromuscular re-education, Balance training, Gait training, Patient/Family education, Joint mobilization, Stair training, Vestibular training, Orthotic/Fit training, DME instructions, Dry Needling, Electrical stimulation, Wheelchair mobility training, Cryotherapy, Moist heat, Compression bandaging, Manual therapy, and Re-evaluation  PLAN FOR NEXT SESSION:  As much as possible / is tolerated have patient stand with good posture during his rest breaks. continue with strengthening exercises of the LE's, Functional ambulation when appropriate.   Lenda Kelp PT Physical Therapist- [place]  [facility]

## 2023-03-27 ENCOUNTER — Ambulatory Visit: Payer: 59

## 2023-03-27 DIAGNOSIS — R269 Unspecified abnormalities of gait and mobility: Secondary | ICD-10-CM

## 2023-03-27 DIAGNOSIS — M6281 Muscle weakness (generalized): Secondary | ICD-10-CM

## 2023-03-27 DIAGNOSIS — R262 Difficulty in walking, not elsewhere classified: Secondary | ICD-10-CM

## 2023-03-27 DIAGNOSIS — R2681 Unsteadiness on feet: Secondary | ICD-10-CM

## 2023-03-27 DIAGNOSIS — R2689 Other abnormalities of gait and mobility: Secondary | ICD-10-CM

## 2023-03-27 DIAGNOSIS — R278 Other lack of coordination: Secondary | ICD-10-CM

## 2023-03-27 NOTE — Therapy (Signed)
**Note Curtis-Identified via Obfuscation** OUTPATIENT PHYSICAL THERAPY NEURO TREATMENT   Patient Name: [name] MRN: [id number] DOB:Jul 04, 1965, 57 y.o., male Today's Date: 03/28/2023  PCP: Curtis Baas, MD  REFERRING PROVIDER: Enid Baas, MD    PT End of Session - 03/27/23 1310     Visit Number 45    Number of Visits 67    Date for PT Re-Evaluation 06/12/23    Progress Note Due on Visit 50    PT Start Time 1315    PT Stop Time 1359    PT Time Calculation (min) 44 min    Equipment Utilized During Treatment Gait belt    Activity Tolerance Patient tolerated treatment well;No increased pain    Behavior During Therapy WFL for tasks assessed/performed                               Past Medical History:  Diagnosis Date   Asthma    GERD (gastroesophageal reflux disease)    Hyperlipidemia    Hypertension    Obesity    Stroke [facility])    Past Surgical History:  Procedure Laterality Date   BRAIN SURGERY     Patient Active Problem List   Diagnosis Date Noted   Impaired glucose tolerance 03/23/2020   Learning disability 03/23/2020   Urinary and fecal incontinence 06/14/2019   Chronic pain syndrome 03/23/2019   Constipation 03/23/2019   Dysarthria 03/23/2019   Dysphagia 03/23/2019   Learning difficulty 03/23/2019   Morbid obesity (HCC) 03/23/2019   Muscle weakness 03/23/2019   Vitamin D deficiency 03/23/2019   Bilateral carotid artery stenosis 12/15/2018   Moderate aortic valve stenosis 12/15/2018   BMI 45.0-49.9, adult (HCC) 11/10/2018   Cerebrovascular accident (HCC) 05/02/2016   Hemiparesis affecting right side as late effect of cerebrovascular accident (HCC) 05/01/2016   Brain tumor (HCC) 11/24/2014   Gastro-esophageal reflux disease without esophagitis 11/24/2014   Accident due to mechanical fall without injury 08/03/2014   Essential hypertension 06/13/2014   Mixed hyperlipidemia 06/13/2014   Unspecified sequelae of cerebral infarction 06/13/2014   Thyromegaly  01/07/2014   Type 2 diabetes mellitus without complication (HCC) 01/06/2014   Neck mass 09/08/2013   Swimmer's ear 09/08/2013   Erectile dysfunction 01/03/2013   Prediabetes 01/03/2013   ONSET DATE: 05/09/2016  REFERRING DIAG: W09.811 (ICD-10-CM) - Hemiplegia and hemiparesis following cerebral infarction affecting right dominant side   THERAPY DIAG:  Unsteadiness on feet  Difficulty in walking, not elsewhere classified  Other abnormalities of gait and mobility  Abnormality of gait and mobility  Muscle weakness (generalized)  Other lack of coordination  Rationale for Evaluation and Treatment Rehabilitation  SUBJECTIVE:  SUBJECTIVE STATEMENT  Pt continues to report having a good week. "I might surprise you today."  Pt accompanied by: self  PERTINENT HISTORY:   The pt is a pleasant 57 yo male who returns to PT for evaluation of deficits s/o old CVA. Pt with hx of CVA Feb 2018. He was previously fully independent, but CVA caused significant deficits, mainly affecting his right side. Pt reports continued difficulty with walking and transfers. Pt still using WC for majority of mobility, otherwise he uses his RW. Pt lives with his mom who is his main caregiver. Patient had outpatient PT previously in clinic in 2023. He reports since discharge he has been completing his HEP and walking about every day. Pt would like to improve his walking mainly, but also would like to work on balance. He also reports difficulty with transfers. Pt's mom and his caregiver helps him with ADLs. However, he says he is now waiting for a new caregiver. Prior to this pt had caregiver 3x/week. PMH significant for hx of brain tumor, past CVA, DMII and HTN. See chart for additional information.  PAIN:  Are you having pain? Pt  reports no pain currently  PRECAUTIONS: fall  WEIGHT BEARING RESTRICTIONS none  PLOF: required assistance with ADLs (old CVA), prior to CVA in 2018 was indep.  History of Falls: Pt reports no recent falls  Home information: Pt lives with mother who is his primary caregiver. Previously had home aid, 3x/week but reports currently waiting for new one.  PATIENT GOALS: Pt would like to improve his ability to ambulate.  OBJECTIVE:  taken at [facility] unless otherwise noted:   DIAGNOSTIC FINDINGS: No recent imaging per chart  Objective measures taken at eval unless otherwise specified:   Posture: forward head posture, rounded shoulders  MMT LE: grossly  4+/5, exception hip flexors and abductors 4/5 each bilat   Functional tests:  5xSTS:  34 sec, use of BUE (multiple initial attempts with posterior LOB in chair)   : 0.096 m/s with RW, close CGA, and WC follow  Gait for distance (goal 150 ft): deferred due to time  Gait mechanics: impaired, decreased speed (see ), crouched posture, poor postural stability and relies heavily on BUE support on RW. Pt now with bilat AFOS   FOTO: 44 (goal 50)    TODAY'S TREATMENT  03/28/23   THEREX:  Shoulder horizontal abd with RTB (focusing on erect sitting posture) 2 x 12 reps  Scap retraction with RTB (2 sets of 12 reps) Seated hip march - 2 set of 12 reps  Seated knee ext- 2 sets of 12 reps alt LE's Using a reference to raise his shoe up to desired height (near full ROM)  Seated gluteal sets - hold 5 sec  x 10  Seated hip add ball squeeze hold 5 sec x 10 reps    Therapeutic activities: Scooting to edge of w/c and back with BUE Support x 5 Standing postural training- from forward lumbar flexed into erect posture - 2 sets of 10 reps- Focusing on VC to remind patient to look ahead.   Sit to stand x 10 reps (focusing on consistency with technique) left UE on walker handle and right UE pushing up from armrest of chair - emphasis on  having patient lean forward as much as possible to propel up without falling backward. He was able to perform 7/10 standing without posterior lean- requiring some physical assist for 3 reps.      PATIENT EDUCATION: Education details: Pt educated  throughout session about proper posture and technique with exercises. Improved exercise technique, movement at target joints, use of target muscles after min to mod verbal, visual, tactile cues.   Person educated: Patient Education method: Explanation, Demonstration, Tactile cues, and Verbal cues Education comprehension: verbalized understanding, returned demonstration, and verbal cues required  HOME EXERCISE PROGRAM:  Standing hip flexor stretch at counter, prolonged standing with erect posture for gluteal activation, HS curls with RTB   GOALS: Goals reviewed with patient? No  SHORT TERM GOALS: Target date: 11/12/2022  Pt will be independent with HEP in order to improve strength and balance in order to decrease fall risk and improve function at home. Baseline: instructed in HEP today, to provide printout next visit Goal status: MET    LONG TERM GOALS: Target date: 06/12/2023  Pt will increase FOTO to at least 50 in order to demonstrate improvement in function related to mobility and QoL. Baseline: 44 7/29:48 9/10:40 12/24/2022= 51 Goal status: MET  2.  Pt (< 60 yrs old) will complete 5xSTS test < 30 seconds indicating an increase in LE strength and improved balance. Baseline: 10/01/22 34 sec (multiple initial attempts with posterior LOB in chair) 12/20: 49 seconds 11/04/22:1:06, min A on multiple attempts to maintain balance and pt stays in flexed posture throughout test when coming to standing.   9/10: 27.5 sec, Pushes WC post consistently, close CGA  10/22: 47 seconds first attempt 12/5: 1 minute, 37 seconds, multiple attempts to get first stand, but no difficulty after that; 03/20/2023= 1 min 2 sec with 1UE on walker and 1 on armrest-  some posterior lean- Close CGA Goal status: ONGOING  3.  Pt will increase to > 0.5 m/s (30 sec) as to improve gait speed for better home ambulation and reduce fall risk. Baseline: 10/01/22 0.096 m/s with RW, close CGA, and WC follow previously 12/27: 0.11 m/s 11/04/22: .056 m/s  9/10: 076 m/s 10/22: 0.64 m/s  11/26: .076 m/s; 03/20/2023= 0.08 m/s Goal status: ONGOING   4.  Pt will increase hip flexor and abductor  gross strength to 4+/5 bilat as to improve functional strength for independent gait, increased standing tolerance, and increased ADL ability. Baseline: 10/01/22 4+/5, exception hip flexors and abductors 4/5 each bilat  (previously 03/27/22: 4+/5 ) 9/10: Hip flexor 4/5, hip abductor 4+/5 10/22: hip flexor 4/5 hip abductor 4+/5 11/26: hip flexor 4+/5 hip abductor 4+/5 Goal status: MET  5.  Pt will ambulate 150 ft with BRW and WC follow with no rest breaks in less than 12 min to allow for improved mobility within home and increased independence. Baseline: 10/03/22 150' with BRW 04/03/22: 105 ft with BRW 7/29: 100 in 9:30 with pediatric RW 9/10:101 ft with BRW stooped posture worsened throughout test 12/24/2022- 150 feet using BRW, CGA with w/c follow- in 10 min 17 sec. Will keep goal to make sure he can do this consistently 10/22: 119 ft with BRW 8 min 23 seconds terminated by patient  11/26: 148 ft with BRW 14 min 34 seconds terminated by patient 03/20/2023= 152 feet using BRW, CGA and w/c follow in 14 min 3 sec- Need to keep goal active to ensure consistency but will revised and add a time element to goal.  Goal status: Progressing and Revised    ASSESSMENT :  CLINICAL IMPRESSION:  Patient presented with good motivation today for all activities. He presented with improving sit to stand with less physical assist yet continues to require VC for best and consistent technique.  He performed well with all exercises - some delay with right LE yet if provided time and encouragement  to raise his right leg with all activities. Pt will continue to benefit from skilled physical therapy intervention to address impairments, improve QOL, and attain therapy goals.    OBJECTIVE IMPAIRMENTS Abnormal gait, decreased activity tolerance, decreased balance, decreased coordination, decreased endurance, decreased mobility, difficulty walking, decreased ROM, decreased strength, improper body mechanics, postural dysfunction, and obesity.   ACTIVITY LIMITATIONS carrying, lifting, bending, sitting, standing, squatting, stairs, transfers, bed mobility, continence, bathing, toileting, dressing, hygiene/grooming, and locomotion level  PARTICIPATION LIMITATIONS: meal prep, cleaning, laundry, interpersonal relationship, driving, shopping, community activity, and yard work  PERSONAL FACTORS Fitness, Past/current experiences, Time since onset of injury/illness/exacerbation, and 3+ comorbidities: hx of brain tumor, past CVA, HTN, obesity, learning disability, DMII  are also affecting patient's functional outcome.   REHAB POTENTIAL: Fair    CLINICAL DECISION MAKING: Stable/uncomplicated  EVALUATION COMPLEXITY: Low  PLAN: PT FREQUENCY: 1-2x/week  PT DURATION: 12 weeks  PLANNED INTERVENTIONS: Therapeutic exercises, Therapeutic activity, Neuromuscular re-education, Balance training, Gait training, Patient/Family education, Joint mobilization, Stair training, Vestibular training, Orthotic/Fit training, DME instructions, Dry Needling, Electrical stimulation, Wheelchair mobility training, Cryotherapy, Moist heat, Compression bandaging, Manual therapy, and Re-evaluation  PLAN FOR NEXT SESSION:  As much as possible / is tolerated have patient stand with good posture during his rest breaks. continue with strengthening exercises of the LE's, Functional ambulation when appropriate.   Lenda Kelp PT Physical Therapist- [place]  [facility]

## 2023-04-01 ENCOUNTER — Ambulatory Visit: Payer: 59 | Admitting: Physical Therapy

## 2023-04-03 ENCOUNTER — Ambulatory Visit: Payer: 59

## 2023-04-07 NOTE — Therapy (Signed)
 OUTPATIENT PHYSICAL THERAPY NEURO TREATMENT   Patient Name: Curtis Powell MRN: [id number] DOB:September 29, 1965, 57 y.o., male Today's Date: 04/08/2023  PCP: Sherial Bail, MD  REFERRING PROVIDER: Sherial Bail, MD    PT End of Session - 04/08/23 1311     Visit Number 46    Number of Visits 67    Date for PT Re-Evaluation 06/12/23    Progress Note Due on Visit 50    PT Start Time 1315    PT Stop Time 1357    PT Time Calculation (min) 42 min    Equipment Utilized During Treatment Gait belt    Activity Tolerance Patient tolerated treatment well;No increased pain    Behavior During Therapy WFL for tasks assessed/performed             Past Medical History:  Diagnosis Date   Asthma    GERD (gastroesophageal reflux disease)    Hyperlipidemia    Hypertension    Obesity    Stroke [facility])    Past Surgical History:  Procedure Laterality Date   BRAIN SURGERY     Patient Active Problem List   Diagnosis Date Noted   Impaired glucose tolerance 03/23/2020   Learning disability 03/23/2020   Urinary and fecal incontinence 06/14/2019   Chronic pain syndrome 03/23/2019   Constipation 03/23/2019   Dysarthria 03/23/2019   Dysphagia 03/23/2019   Learning difficulty 03/23/2019   Morbid obesity (HCC) 03/23/2019   Muscle weakness 03/23/2019   Vitamin D deficiency 03/23/2019   Bilateral carotid artery stenosis 12/15/2018   Moderate aortic valve stenosis 12/15/2018   BMI 45.0-49.9, adult (HCC) 11/10/2018   Cerebrovascular accident (HCC) 05/02/2016   Hemiparesis affecting right side as late effect of cerebrovascular accident (HCC) 05/01/2016   Brain tumor (HCC) 11/24/2014   Gastro-esophageal reflux disease without esophagitis 11/24/2014   Accident due to mechanical fall without injury 08/03/2014   Essential hypertension 06/13/2014   Mixed hyperlipidemia 06/13/2014   Unspecified sequelae of cerebral infarction 06/13/2014   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/2014   Type 2 diabetes mellitus  without complication (HCC) 01/06/2014   Neck mass 09/08/2013   Swimmer's ear 09/08/2013   Erectile dysfunction 01/03/2013   Prediabetes 01/03/2013   ONSET DATE: 05/09/2016  REFERRING DIAG: P30.648 (ICD-10-CM) - Hemiplegia and hemiparesis following cerebral infarction affecting right dominant side   THERAPY DIAG:  Unsteadiness on feet  Difficulty in walking, not elsewhere classified  Other abnormalities of gait and mobility  Abnormality of gait and mobility  Muscle weakness (generalized)  Rationale for Evaluation and Treatment Rehabilitation  SUBJECTIVE:  SUBJECTIVE STATEMENT   Patient denies any new complaints this session.   Pt accompanied by: self  PERTINENT HISTORY:   The pt is a pleasant 57 yo male who returns to PT for evaluation of deficits s/o old CVA. Pt with hx of CVA Feb 2018. He was previously fully independent, but CVA caused significant deficits, mainly affecting his right side. Pt reports continued difficulty with walking and transfers. Pt still using WC for majority of mobility, otherwise he uses his RW. Pt lives with his mom who is his main caregiver. Patient had outpatient PT previously in clinic in 2023. He reports since discharge he has been completing his HEP and walking about every day. Pt would like to improve his walking mainly, but also would like to work on balance. He also reports difficulty with transfers. Pt's mom and his caregiver helps him with ADLs. However, he says he is now waiting for a new caregiver. Prior to this pt had caregiver 3x/week. PMH significant for hx of brain tumor, past CVA, DMII and HTN. See chart for additional information.  PAIN:  Are you having pain? Pt reports no pain currently  PRECAUTIONS: fall  WEIGHT BEARING RESTRICTIONS none  PLOF:  required assistance with ADLs (old CVA), prior to CVA in 2018 was indep.  History of Falls: Pt reports no recent falls  Home information: Pt lives with mother who is his primary caregiver. Previously had home aid, 3x/week but reports currently waiting for new one.  PATIENT GOALS: Pt would like to improve his ability to ambulate.  OBJECTIVE:  taken at [facility] unless otherwise noted:   DIAGNOSTIC FINDINGS: No recent imaging per chart  Objective measures taken at eval unless otherwise specified:   Posture: forward head posture, rounded shoulders  MMT LE: grossly  4+/5, exception hip flexors and abductors 4/5 each bilat   Functional tests:  5xSTS:  34 sec, use of BUE (multiple initial attempts with posterior LOB in chair)   : 0.096 m/s with RW, close CGA, and WC follow  Gait for distance (goal 150 ft): deferred due to time  Gait mechanics: impaired, decreased speed (see ), crouched posture, poor postural stability and relies heavily on BUE support on RW. Pt now with bilat AFOS   FOTO: 44 (goal 50)    TODAY'S TREATMENT  04/08/23    THEREX:  Shoulder horizontal abd with RTB (focusing on erect sitting posture) 2 x 15 reps  Scap retraction with RTB (2 sets of 15 reps) Seated hip march - 2 set of 12 reps  Seated knee ext- 2 sets of 15 each  Seated hip add ball squeeze hold 5 sec x 10 reps  Nustep level 4 x 4 minutes   Therapeutic activities: Standing postural training- from forward lumbar flexed into erect posture - 10 reps- Focusing on VC to remind patient to look ahead.   Sit to stand x 10 reps with L hand pushing  from w/c and R hand on RW - minA each rep to boost into standing and to prevent posterior LOB    PATIENT EDUCATION: Education details: Pt educated throughout session about proper posture and technique with exercises. Improved exercise technique, movement at target joints, use of target muscles after min to mod verbal, visual, tactile cues.   Person  educated: Patient Education method: Explanation, Demonstration, Tactile cues, and Verbal cues Education comprehension: verbalized understanding, returned demonstration, and verbal cues required  HOME EXERCISE PROGRAM:  Standing hip flexor stretch at counter, prolonged standing with erect posture for  gluteal activation, HS curls with RTB   GOALS: Goals reviewed with patient? No  SHORT TERM GOALS: Target date: 11/12/2022  Pt will be independent with HEP in order to improve strength and balance in order to decrease fall risk and improve function at home. Baseline: instructed in HEP today, to provide printout next visit Goal status: MET    LONG TERM GOALS: Target date: 06/12/2023  Pt will increase FOTO to at least 50 in order to demonstrate improvement in function related to mobility and QoL. Baseline: 44 7/29:48 9/10:40 12/24/2022= 51 Goal status: MET  2.  Pt (< 60 yrs old) will complete 5xSTS test < 30 seconds indicating an increase in LE strength and improved balance. Baseline: 10/01/22 34 sec (multiple initial attempts with posterior LOB in chair) 12/20: 49 seconds 11/04/22:1:06, min A on multiple attempts to maintain balance and pt stays in flexed posture throughout test when coming to standing.   9/10: 27.5 sec, Pushes WC post consistently, close CGA  10/22: 47 seconds first attempt 12/5: 1 minute, 37 seconds, multiple attempts to get first stand, but no difficulty after that; 03/20/2023= 1 min 2 sec with 1UE on Sheilia Reznick and 1 on armrest- some posterior lean- Close CGA Goal status: ONGOING  3.  Pt will increase to > 0.5 m/s (30 sec) as to improve gait speed for better home ambulation and reduce fall risk. Baseline: 10/01/22 0.096 m/s with RW, close CGA, and WC follow previously 12/27: 0.11 m/s 11/04/22: .056 m/s  9/10: 076 m/s 10/22: 0.64 m/s  11/26: .076 m/s; 03/20/2023= 0.08 m/s Goal status: ONGOING   4.  Pt will increase hip flexor and abductor  gross strength to 4+/5 bilat as  to improve functional strength for independent gait, increased standing tolerance, and increased ADL ability. Baseline: 10/01/22 4+/5, exception hip flexors and abductors 4/5 each bilat  (previously 03/27/22: 4+/5 ) 9/10: Hip flexor 4/5, hip abductor 4+/5 10/22: hip flexor 4/5 hip abductor 4+/5 11/26: hip flexor 4+/5 hip abductor 4+/5 Goal status: MET  5.  Pt will ambulate 150 ft with BRW and WC follow with no rest breaks in less than 12 min to allow for improved mobility within home and increased independence. Baseline: 10/03/22 150' with BRW 04/03/22: 105 ft with BRW 7/29: 100 in 9:30 with pediatric RW 9/10:101 ft with BRW stooped posture worsened throughout test 12/24/2022- 150 feet using BRW, CGA with w/c follow- in 10 min 17 sec. Will keep goal to make sure he can do this consistently 10/22: 119 ft with BRW 8 min 23 seconds terminated by patient  11/26: 148 ft with BRW 14 min 34 seconds terminated by patient 03/20/2023= 152 feet using BRW, CGA and w/c follow in 14 min 3 sec- Need to keep goal active to ensure consistency but will revised and add a time element to goal.  Goal status: Progressing and Revised    ASSESSMENT :  CLINICAL IMPRESSION: Patient presents to treatment session motivated to participate with no new complaints. Session focused on BLE strengthening and sit to stand technique. Requiring minA to brace and boots up into standing each rep. Tolerated treatment session well overall. Pt will continue to benefit from skilled physical therapy intervention to address impairments, improve QOL, and attain therapy goals.    OBJECTIVE IMPAIRMENTS Abnormal gait, decreased activity tolerance, decreased balance, decreased coordination, decreased endurance, decreased mobility, difficulty walking, decreased ROM, decreased strength, improper body mechanics, postural dysfunction, and obesity.   ACTIVITY LIMITATIONS carrying, lifting, bending, sitting, standing, squatting, stairs, transfers,  bed mobility, continence, bathing, toileting, dressing, hygiene/grooming, and locomotion level  PARTICIPATION LIMITATIONS: meal prep, cleaning, laundry, interpersonal relationship, driving, shopping, community activity, and yard work  PERSONAL FACTORS Fitness, Past/current experiences, Time since onset of injury/illness/exacerbation, and 3+ comorbidities: hx of brain tumor, past CVA, HTN, obesity, learning disability, DMII  are also affecting patient's functional outcome.   REHAB POTENTIAL: Fair    CLINICAL DECISION MAKING: Stable/uncomplicated  EVALUATION COMPLEXITY: Low  PLAN: PT FREQUENCY: 1-2x/week  PT DURATION: 12 weeks  PLANNED INTERVENTIONS: Therapeutic exercises, Therapeutic activity, Neuromuscular re-education, Balance training, Gait training, Patient/Family education, Joint mobilization, Stair training, Vestibular training, Orthotic/Fit training, DME instructions, Dry Needling, Electrical stimulation, Wheelchair mobility training, Cryotherapy, Moist heat, Compression bandaging, Manual therapy, and Re-evaluation  PLAN FOR NEXT SESSION:  As much as possible / is tolerated have patient stand with good posture during his rest breaks. continue with strengthening exercises of the LE's, Functional ambulation when appropriate.  Maryanne Finder, PT, DPT  Physical Therapist- [place]  [facility]

## 2023-04-08 ENCOUNTER — Ambulatory Visit: Payer: 59

## 2023-04-08 DIAGNOSIS — R2681 Unsteadiness on feet: Secondary | ICD-10-CM | POA: Diagnosis not present

## 2023-04-08 DIAGNOSIS — R2689 Other abnormalities of gait and mobility: Secondary | ICD-10-CM

## 2023-04-08 DIAGNOSIS — R262 Difficulty in walking, not elsewhere classified: Secondary | ICD-10-CM

## 2023-04-08 DIAGNOSIS — M6281 Muscle weakness (generalized): Secondary | ICD-10-CM

## 2023-04-08 DIAGNOSIS — R269 Unspecified abnormalities of gait and mobility: Secondary | ICD-10-CM

## 2023-04-10 ENCOUNTER — Ambulatory Visit: Payer: 59 | Attending: Internal Medicine

## 2023-04-10 DIAGNOSIS — R296 Repeated falls: Secondary | ICD-10-CM | POA: Diagnosis present

## 2023-04-10 DIAGNOSIS — M6281 Muscle weakness (generalized): Secondary | ICD-10-CM

## 2023-04-10 DIAGNOSIS — R2681 Unsteadiness on feet: Secondary | ICD-10-CM

## 2023-04-10 DIAGNOSIS — R531 Weakness: Secondary | ICD-10-CM | POA: Diagnosis present

## 2023-04-10 DIAGNOSIS — R262 Difficulty in walking, not elsewhere classified: Secondary | ICD-10-CM

## 2023-04-10 DIAGNOSIS — R2689 Other abnormalities of gait and mobility: Secondary | ICD-10-CM

## 2023-04-10 DIAGNOSIS — I69351 Hemiplegia and hemiparesis following cerebral infarction affecting right dominant side: Secondary | ICD-10-CM | POA: Diagnosis present

## 2023-04-10 DIAGNOSIS — R278 Other lack of coordination: Secondary | ICD-10-CM | POA: Diagnosis present

## 2023-04-10 DIAGNOSIS — R269 Unspecified abnormalities of gait and mobility: Secondary | ICD-10-CM

## 2023-04-10 NOTE — Therapy (Signed)
 OUTPATIENT PHYSICAL THERAPY NEURO TREATMENT   Patient Name: Curtis Powell MRN: [id number] DOB:03-02-1966, 58 y.o., male Today's Date: 04/10/2023  PCP: Sherial Bail, MD  REFERRING PROVIDER: Sherial Bail, MD    PT End of Session - 04/10/23 1301     Visit Number 47    Number of Visits 67    Date for PT Re-Evaluation 06/12/23    Progress Note Due on Visit 50    PT Start Time 1315    PT Stop Time 1400    PT Time Calculation (min) 45 min    Equipment Utilized During Treatment Gait belt    Activity Tolerance Patient tolerated treatment well;No increased pain    Behavior During Therapy WFL for tasks assessed/performed              Past Medical History:  Diagnosis Date   Asthma    GERD (gastroesophageal reflux disease)    Hyperlipidemia    Hypertension    Obesity    Stroke [facility])    Past Surgical History:  Procedure Laterality Date   BRAIN SURGERY     Patient Active Problem List   Diagnosis Date Noted   Impaired glucose tolerance 03/23/2020   Learning disability 03/23/2020   Urinary and fecal incontinence 06/14/2019   Chronic pain syndrome 03/23/2019   Constipation 03/23/2019   Dysarthria 03/23/2019   Dysphagia 03/23/2019   Learning difficulty 03/23/2019   Morbid obesity (HCC) 03/23/2019   Muscle weakness 03/23/2019   Vitamin D deficiency 03/23/2019   Bilateral carotid artery stenosis 12/15/2018   Moderate aortic valve stenosis 12/15/2018   BMI 45.0-49.9, adult (HCC) 11/10/2018   Cerebrovascular accident (HCC) 05/02/2016   Hemiparesis affecting right side as late effect of cerebrovascular accident (HCC) 05/01/2016   Brain tumor (HCC) 11/24/2014   Gastro-esophageal reflux disease without esophagitis 11/24/2014   Accident due to mechanical fall without injury 08/03/2014   Essential hypertension 06/13/2014   Mixed hyperlipidemia 06/13/2014   Unspecified sequelae of cerebral infarction 06/13/2014   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/2014   Type 2 diabetes mellitus  without complication (HCC) 01/06/2014   Neck mass 09/08/2013   Swimmer's ear 09/08/2013   Erectile dysfunction 01/03/2013   Prediabetes 01/03/2013   ONSET DATE: 05/09/2016  REFERRING DIAG: P30.648 (ICD-10-CM) - Hemiplegia and hemiparesis following cerebral infarction affecting right dominant side   THERAPY DIAG:  Unsteadiness on feet  Difficulty in walking, not elsewhere classified  Other abnormalities of gait and mobility  Abnormality of gait and mobility  Muscle weakness (generalized)  Other lack of coordination  Rationale for Evaluation and Treatment Rehabilitation  SUBJECTIVE:  SUBJECTIVE STATEMENT  Patient reports doing well with no new issues. States he had a good Holiday.   Pt accompanied by: self  PERTINENT HISTORY:   The pt is a pleasant 58 yo male who returns to PT for evaluation of deficits s/o old CVA. Pt with hx of CVA Feb 2018. He was previously fully independent, but CVA caused significant deficits, mainly affecting his right side. Pt reports continued difficulty with walking and transfers. Pt still using WC for majority of mobility, otherwise he uses his RW. Pt lives with his mom who is his main caregiver. Patient had outpatient PT previously in clinic in 2023. He reports since discharge he has been completing his HEP and walking about every day. Pt would like to improve his walking mainly, but also would like to work on balance. He also reports difficulty with transfers. Pt's mom and his caregiver helps him with ADLs. However, he says he is now waiting for a new caregiver. Prior to this pt had caregiver 3x/week. PMH significant for hx of brain tumor, past CVA, DMII and HTN. See chart for additional information.  PAIN:  Are you having pain? Pt reports no pain  currently  PRECAUTIONS: fall  WEIGHT BEARING RESTRICTIONS none  PLOF: required assistance with ADLs (old CVA), prior to CVA in 2018 was indep.  History of Falls: Pt reports no recent falls  Home information: Pt lives with mother who is his primary caregiver. Previously had home aid, 3x/week but reports currently waiting for new one.  PATIENT GOALS: Pt would like to improve his ability to ambulate.  OBJECTIVE:  taken at [facility] unless otherwise noted:   DIAGNOSTIC FINDINGS: No recent imaging per chart  Objective measures taken at eval unless otherwise specified:   Posture: forward head posture, rounded shoulders  MMT LE: grossly  4+/5, exception hip flexors and abductors 4/5 each bilat   Functional tests:  5xSTS:  34 sec, use of BUE (multiple initial attempts with posterior LOB in chair)   : 0.096 m/s with RW, close CGA, and WC follow  Gait for distance (goal 150 ft): deferred due to time  Gait mechanics: impaired, decreased speed (see ), crouched posture, poor postural stability and relies heavily on BUE support on RW. Pt now with bilat AFOS   FOTO: 44 (goal 50)    TODAY'S TREATMENT  04/10/23    THEREX:  Shoulder flex- holding a rainbow ball  and lifting overhead- (focusing on erect sitting posture with some thoracic ext) 2 x 10 reps  Scap retraction with GTB (2 sets of 15 reps) Standing  hip march - 2 set of 10 reps alt LE Seated knee ext- 2 sets of 15 each     Therapeutic activities: Standing postural training- from forward lumbar flexed into erect posture - 2 x 10 reps- Focusing on VC to remind patient to look ahead.   Static standing without UE support x 2 min x 2. (Focusing on standing as erect as possible)   Walking in // bars focusing on safe 180 deg turning and VC to take larger steps  down and back x 3  Forward/retro walking in // bars (focusing on improving hip ext and taking larger steps backward) - down and back x 2   Sit to stand x 10  reps with L hand pushing  from w/c and R hand on RW - minA each rep to boost into standing and to prevent posterior LOB    PATIENT EDUCATION: Education details: Pt educated throughout session  about proper posture and technique with exercises. Improved exercise technique, movement at target joints, use of target muscles after min to mod verbal, visual, tactile cues.   Person educated: Patient Education method: Explanation, Demonstration, Tactile cues, and Verbal cues Education comprehension: verbalized understanding, returned demonstration, and verbal cues required  HOME EXERCISE PROGRAM:  Standing hip flexor stretch at counter, prolonged standing with erect posture for gluteal activation, HS curls with RTB   GOALS: Goals reviewed with patient? No  SHORT TERM GOALS: Target date: 11/12/2022  Pt will be independent with HEP in order to improve strength and balance in order to decrease fall risk and improve function at home. Baseline: instructed in HEP today, to provide printout next visit Goal status: MET    LONG TERM GOALS: Target date: 06/12/2023  Pt will increase FOTO to at least 50 in order to demonstrate improvement in function related to mobility and QoL. Baseline: 44 7/29:48 9/10:40 12/24/2022= 51 Goal status: MET  2.  Pt (< 60 yrs old) will complete 5xSTS test < 30 seconds indicating an increase in LE strength and improved balance. Baseline: 10/01/22 34 sec (multiple initial attempts with posterior LOB in chair) 12/20: 49 seconds 11/04/22:1:06, min A on multiple attempts to maintain balance and pt stays in flexed posture throughout test when coming to standing.   9/10: 27.5 sec, Pushes WC post consistently, close CGA  10/22: 47 seconds first attempt 12/5: 1 minute, 37 seconds, multiple attempts to get first stand, but no difficulty after that; 03/20/2023= 1 min 2 sec with 1UE on walker and 1 on armrest- some posterior lean- Close CGA Goal status: ONGOING  3.  Pt will increase  to > 0.5 m/s (30 sec) as to improve gait speed for better home ambulation and reduce fall risk. Baseline: 10/01/22 0.096 m/s with RW, close CGA, and WC follow previously 12/27: 0.11 m/s 11/04/22: .056 m/s  9/10: 076 m/s 10/22: 0.64 m/s  11/26: .076 m/s; 03/20/2023= 0.08 m/s Goal status: ONGOING   4.  Pt will increase hip flexor and abductor  gross strength to 4+/5 bilat as to improve functional strength for independent gait, increased standing tolerance, and increased ADL ability. Baseline: 10/01/22 4+/5, exception hip flexors and abductors 4/5 each bilat  (previously 03/27/22: 4+/5 ) 9/10: Hip flexor 4/5, hip abductor 4+/5 10/22: hip flexor 4/5 hip abductor 4+/5 11/26: hip flexor 4+/5 hip abductor 4+/5 Goal status: MET  5.  Pt will ambulate 150 ft with BRW and WC follow with no rest breaks in less than 12 min to allow for improved mobility within home and increased independence. Baseline: 10/03/22 150' with BRW 04/03/22: 105 ft with BRW 7/29: 100 in 9:30 with pediatric RW 9/10:101 ft with BRW stooped posture worsened throughout test 12/24/2022- 150 feet using BRW, CGA with w/c follow- in 10 min 17 sec. Will keep goal to make sure he can do this consistently 10/22: 119 ft with BRW 8 min 23 seconds terminated by patient  11/26: 148 ft with BRW 14 min 34 seconds terminated by patient 03/20/2023= 152 feet using BRW, CGA and w/c follow in 14 min 3 sec- Need to keep goal active to ensure consistency but will revised and add a time element to goal.  Goal status: Progressing and Revised    ASSESSMENT :  CLINICAL IMPRESSION: Patient presents with good motivation for today's session. He was able to perform most activities in standing today with much improved ability to remain in standing with decreased rest breaks. He responded fairly  to [facility] for increased hip ext for improved retro step and did demonstrate some in session progress with retro walking- able to back up safely and timely at end of  session. Pt will continue to benefit from skilled physical therapy intervention to address impairments, improve QOL, and attain therapy goals.    OBJECTIVE IMPAIRMENTS Abnormal gait, decreased activity tolerance, decreased balance, decreased coordination, decreased endurance, decreased mobility, difficulty walking, decreased ROM, decreased strength, improper body mechanics, postural dysfunction, and obesity.   ACTIVITY LIMITATIONS carrying, lifting, bending, sitting, standing, squatting, stairs, transfers, bed mobility, continence, bathing, toileting, dressing, hygiene/grooming, and locomotion level  PARTICIPATION LIMITATIONS: meal prep, cleaning, laundry, interpersonal relationship, driving, shopping, community activity, and yard work  PERSONAL FACTORS Fitness, Past/current experiences, Time since onset of injury/illness/exacerbation, and 3+ comorbidities: hx of brain tumor, past CVA, HTN, obesity, learning disability, DMII  are also affecting patient's functional outcome.   REHAB POTENTIAL: Fair    CLINICAL DECISION MAKING: Stable/uncomplicated  EVALUATION COMPLEXITY: Low  PLAN: PT FREQUENCY: 1-2x/week  PT DURATION: 12 weeks  PLANNED INTERVENTIONS: Therapeutic exercises, Therapeutic activity, Neuromuscular re-education, Balance training, Gait training, Patient/Family education, Joint mobilization, Stair training, Vestibular training, Orthotic/Fit training, DME instructions, Dry Needling, Electrical stimulation, Wheelchair mobility training, Cryotherapy, Moist heat, Compression bandaging, Manual therapy, and Re-evaluation  PLAN FOR NEXT SESSION:  As much as possible / is tolerated have patient stand with good posture during his rest breaks. continue with strengthening exercises of the LE's, Functional ambulation when appropriate.  Chyrl London, PT Physical Therapist- [place]  [facility]

## 2023-04-15 ENCOUNTER — Ambulatory Visit: Payer: 59

## 2023-04-15 DIAGNOSIS — R269 Unspecified abnormalities of gait and mobility: Secondary | ICD-10-CM

## 2023-04-15 DIAGNOSIS — M6281 Muscle weakness (generalized): Secondary | ICD-10-CM

## 2023-04-15 DIAGNOSIS — R2681 Unsteadiness on feet: Secondary | ICD-10-CM

## 2023-04-15 DIAGNOSIS — R2689 Other abnormalities of gait and mobility: Secondary | ICD-10-CM

## 2023-04-15 DIAGNOSIS — R262 Difficulty in walking, not elsewhere classified: Secondary | ICD-10-CM

## 2023-04-15 DIAGNOSIS — R278 Other lack of coordination: Secondary | ICD-10-CM

## 2023-04-15 NOTE — Therapy (Signed)
 OUTPATIENT PHYSICAL THERAPY NEURO TREATMENT   Patient Name: Curtis Powell MRN: [id number] DOB:[birth year], 58 y.o., male Today's Date: 04/15/2023  PCP: Sherial Bail, MD  REFERRING PROVIDER: Sherial Bail, MD    PT End of Session - 04/15/23 1309     Visit Number 48    Number of Visits 67    Date for PT Re-Evaluation 06/12/23    Progress Note Due on Visit 50    PT Start Time 1311    PT Stop Time 1356    PT Time Calculation (min) 45 min    Equipment Utilized During Treatment Gait belt    Activity Tolerance Patient tolerated treatment well;No increased pain    Behavior During Therapy WFL for tasks assessed/performed              Past Medical History:  Diagnosis Date   Asthma    GERD (gastroesophageal reflux disease)    Hyperlipidemia    Hypertension    Obesity    Stroke [facility])    Past Surgical History:  Procedure Laterality Date   BRAIN SURGERY     Patient Active Problem List   Diagnosis Date Noted   Impaired glucose tolerance 03/23/2020   Learning disability 03/23/2020   Urinary and fecal incontinence 06/14/2019   Chronic pain syndrome 03/23/2019   Constipation 03/23/2019   Dysarthria 03/23/2019   Dysphagia 03/23/2019   Learning difficulty 03/23/2019   Morbid obesity (HCC) 03/23/2019   Muscle weakness 03/23/2019   Vitamin D deficiency 03/23/2019   Bilateral carotid artery stenosis 12/15/2018   Moderate aortic valve stenosis 12/15/2018   BMI 45.0-49.9, adult (HCC) 11/10/2018   Cerebrovascular accident (HCC) 05/02/2016   Hemiparesis affecting right side as late effect of cerebrovascular accident (HCC) 05/01/2016   Brain tumor (HCC) 11/24/2014   Gastro-esophageal reflux disease without esophagitis 11/24/2014   Accident due to mechanical fall without injury 08/03/2014   Essential hypertension 06/13/2014   Mixed hyperlipidemia 06/13/2014   Unspecified sequelae of cerebral infarction 06/13/2014   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/2014   Type 2 diabetes mellitus  without complication (HCC) 01/06/2014   Neck mass 09/08/2013   Swimmer's ear 09/08/2013   Erectile dysfunction 01/03/2013   Prediabetes 01/03/2013   ONSET DATE: 05/09/2016  REFERRING DIAG: P30.648 (ICD-10-CM) - Hemiplegia and hemiparesis following cerebral infarction affecting right dominant side   THERAPY DIAG:  Unsteadiness on feet  Difficulty in walking, not elsewhere classified  Other abnormalities of gait and mobility  Abnormality of gait and mobility  Muscle weakness (generalized)  Other lack of coordination  Rationale for Evaluation and Treatment Rehabilitation  SUBJECTIVE:  SUBJECTIVE STATEMENT  Patient reports feeling okay with no pain or any new issues. States he felt okay after last visit.   Pt accompanied by: self  PERTINENT HISTORY:   The pt is a pleasant 58 yo male who returns to PT for evaluation of deficits s/o old CVA. Pt with hx of CVA Feb 2018. He was previously fully independent, but CVA caused significant deficits, mainly affecting his right side. Pt reports continued difficulty with walking and transfers. Pt still using WC for majority of mobility, otherwise he uses his RW. Pt lives with his mom who is his main caregiver. Patient had outpatient PT previously in clinic in 2023. He reports since discharge he has been completing his HEP and walking about every day. Pt would like to improve his walking mainly, but also would like to work on balance. He also reports difficulty with transfers. Pt's mom and his caregiver helps him with ADLs. However, he says he is now waiting for a new caregiver. Prior to this pt had caregiver 3x/week. PMH significant for hx of brain tumor, past CVA, DMII and HTN. See chart for additional information.  PAIN:  Are you having pain? Pt reports no  pain currently  PRECAUTIONS: fall  WEIGHT BEARING RESTRICTIONS none  PLOF: required assistance with ADLs (old CVA), prior to CVA in 2018 was indep.  History of Falls: Pt reports no recent falls  Home information: Pt lives with mother who is his primary caregiver. Previously had home aide, 3x/week but reports currently waiting for new one.  PATIENT GOALS: Pt would like to improve his ability to ambulate.  OBJECTIVE:  taken at [facility] unless otherwise noted:   DIAGNOSTIC FINDINGS: No recent imaging per chart  Objective measures taken at eval unless otherwise specified:   Posture: forward head posture, rounded shoulders  MMT LE: grossly  4+/5, exception hip flexors and abductors 4/5 each bilat   Functional tests:  5xSTS:  34 sec, use of BUE (multiple initial attempts with posterior LOB in chair)   : 0.096 m/s with RW, close CGA, and WC follow  Gait for distance (goal 150 ft): deferred due to time  Gait mechanics: impaired, decreased speed (see ), crouched posture, poor postural stability and relies heavily on BUE support on RW. Pt now with bilat AFOS   FOTO: 44 (goal 50)    TODAY'S TREATMENT  04/15/23       Therapeutic activities:   Sit to stand 2x 10 reps with L hand pushing  from w/c and R hand on RW - CGA -6/10 times and on 2nd set- 9/10 with CGA-      Ambulation in clinic - using bariatric RW- approx 160 feet with 1 seated rest break- VC for erect standing and longer step length as able. Patient required 20+ min to walk (with brief rest break) and reports exhausted today.   PATIENT EDUCATION: Education details: Pt educated throughout session about proper posture and technique with exercises. Improved exercise technique, movement at target joints, use of target muscles after min to mod verbal, visual, tactile cues.   Person educated: Patient Education method: Explanation, Demonstration, Tactile cues, and Verbal cues Education comprehension: verbalized  understanding, returned demonstration, and verbal cues required  HOME EXERCISE PROGRAM:  Standing hip flexor stretch at counter, prolonged standing with erect posture for gluteal activation, HS curls with RTB   GOALS: Goals reviewed with patient? No  SHORT TERM GOALS: Target date: 11/12/2022  Pt will be independent with HEP in order to improve strength  and balance in order to decrease fall risk and improve function at home. Baseline: instructed in HEP today, to provide printout next visit Goal status: MET    LONG TERM GOALS: Target date: 06/12/2023  Pt will increase FOTO to at least 50 in order to demonstrate improvement in function related to mobility and QoL. Baseline: 44 7/29:48 9/10:40 12/24/2022= 51 Goal status: MET  2.  Pt (< 60 yrs old) will complete 5xSTS test < 30 seconds indicating an increase in LE strength and improved balance. Baseline: 10/01/22 34 sec (multiple initial attempts with posterior LOB in chair) 12/20: 49 seconds 11/04/22:1:06, min A on multiple attempts to maintain balance and pt stays in flexed posture throughout test when coming to standing.   9/10: 27.5 sec, Pushes WC post consistently, close CGA  10/22: 47 seconds first attempt 12/5: 1 minute, 37 seconds, multiple attempts to get first stand, but no difficulty after that; 03/20/2023= 1 min 2 sec with 1UE on walker and 1 on armrest- some posterior lean- Close CGA Goal status: ONGOING  3.  Pt will increase to > 0.5 m/s (30 sec) as to improve gait speed for better home ambulation and reduce fall risk. Baseline: 10/01/22 0.096 m/s with RW, close CGA, and WC follow previously 12/27: 0.11 m/s 11/04/22: .056 m/s  9/10: 076 m/s 10/22: 0.64 m/s  11/26: .076 m/s; 03/20/2023= 0.08 m/s Goal status: ONGOING   4.  Pt will increase hip flexor and abductor  gross strength to 4+/5 bilat as to improve functional strength for independent gait, increased standing tolerance, and increased ADL ability. Baseline: 10/01/22  4+/5, exception hip flexors and abductors 4/5 each bilat  (previously 03/27/22: 4+/5 ) 9/10: Hip flexor 4/5, hip abductor 4+/5 10/22: hip flexor 4/5 hip abductor 4+/5 11/26: hip flexor 4+/5 hip abductor 4+/5 Goal status: MET  5.  Pt will ambulate 150 ft with BRW and WC follow with no rest breaks in less than 12 min to allow for improved mobility within home and increased independence. Baseline: 10/03/22 150' with BRW 04/03/22: 105 ft with BRW 7/29: 100 in 9:30 with pediatric RW 9/10:101 ft with BRW stooped posture worsened throughout test 12/24/2022- 150 feet using BRW, CGA with w/c follow- in 10 min 17 sec. Will keep goal to make sure he can do this consistently 10/22: 119 ft with BRW 8 min 23 seconds terminated by patient  11/26: 148 ft with BRW 14 min 34 seconds terminated by patient 03/20/2023= 152 feet using BRW, CGA and w/c follow in 14 min 3 sec- Need to keep goal active to ensure consistency but will revised and add a time element to goal.  Goal status: Progressing and Revised    ASSESSMENT :  CLINICAL IMPRESSION: Patient performed well today exhibiting his best day so far with sit to stand transfers- no VC for technique and much improved physical ability to consistent less physical assist.  He was fatigued overall with walking yet did demonstrate some good carryover and still able to walk same distance with 1 brief rest break today. Pt will continue to benefit from skilled physical therapy intervention to address impairments, improve QOL, and attain therapy goals.    OBJECTIVE IMPAIRMENTS Abnormal gait, decreased activity tolerance, decreased balance, decreased coordination, decreased endurance, decreased mobility, difficulty walking, decreased ROM, decreased strength, improper body mechanics, postural dysfunction, and obesity.   ACTIVITY LIMITATIONS carrying, lifting, bending, sitting, standing, squatting, stairs, transfers, bed mobility, continence, bathing, toileting, dressing,  hygiene/grooming, and locomotion level  PARTICIPATION LIMITATIONS: meal prep,  cleaning, laundry, interpersonal relationship, driving, shopping, community activity, and yard work  PERSONAL FACTORS Fitness, Past/current experiences, Time since onset of injury/illness/exacerbation, and 3+ comorbidities: hx of brain tumor, past CVA, HTN, obesity, learning disability, DMII  are also affecting patient's functional outcome.   REHAB POTENTIAL: Fair    CLINICAL DECISION MAKING: Stable/uncomplicated  EVALUATION COMPLEXITY: Low  PLAN: PT FREQUENCY: 1-2x/week  PT DURATION: 12 weeks  PLANNED INTERVENTIONS: Therapeutic exercises, Therapeutic activity, Neuromuscular re-education, Balance training, Gait training, Patient/Family education, Joint mobilization, Stair training, Vestibular training, Orthotic/Fit training, DME instructions, Dry Needling, Electrical stimulation, Wheelchair mobility training, Cryotherapy, Moist heat, Compression bandaging, Manual therapy, and Re-evaluation  PLAN FOR NEXT SESSION:  As much as possible / is tolerated have patient stand with good posture during his rest breaks. continue with strengthening exercises of the LE's, Functional ambulation when appropriate.  Chyrl London, PT Physical Therapist- [place]  [facility]

## 2023-04-17 ENCOUNTER — Ambulatory Visit: Payer: 59

## 2023-04-17 DIAGNOSIS — I69351 Hemiplegia and hemiparesis following cerebral infarction affecting right dominant side: Secondary | ICD-10-CM

## 2023-04-17 DIAGNOSIS — R269 Unspecified abnormalities of gait and mobility: Secondary | ICD-10-CM

## 2023-04-17 DIAGNOSIS — R531 Weakness: Secondary | ICD-10-CM

## 2023-04-17 DIAGNOSIS — R2689 Other abnormalities of gait and mobility: Secondary | ICD-10-CM

## 2023-04-17 DIAGNOSIS — R262 Difficulty in walking, not elsewhere classified: Secondary | ICD-10-CM

## 2023-04-17 DIAGNOSIS — R296 Repeated falls: Secondary | ICD-10-CM

## 2023-04-17 DIAGNOSIS — R278 Other lack of coordination: Secondary | ICD-10-CM

## 2023-04-17 DIAGNOSIS — R2681 Unsteadiness on feet: Secondary | ICD-10-CM

## 2023-04-17 DIAGNOSIS — M6281 Muscle weakness (generalized): Secondary | ICD-10-CM

## 2023-04-17 NOTE — Therapy (Signed)
 OUTPATIENT PHYSICAL THERAPY NEURO TREATMENT   Patient Name: Curtis Powell MRN: [id number] DOB:[birth year], 58 y.o., male Today's Date: 04/18/2023  PCP: Sherial Bail, MD  REFERRING PROVIDER: Sherial Bail, MD    PT End of Session - 04/17/23 1313     Visit Number 49    Number of Visits 67    Date for PT Re-Evaluation 06/12/23    Progress Note Due on Visit 50    PT Start Time 1315    PT Stop Time 1358    PT Time Calculation (min) 43 min    Equipment Utilized During Treatment Gait belt    Activity Tolerance Patient tolerated treatment well;No increased pain    Behavior During Therapy WFL for tasks assessed/performed              Past Medical History:  Diagnosis Date   Asthma    GERD (gastroesophageal reflux disease)    Hyperlipidemia    Hypertension    Obesity    Stroke [facility])    Past Surgical History:  Procedure Laterality Date   BRAIN SURGERY     Patient Active Problem List   Diagnosis Date Noted   Impaired glucose tolerance 03/23/2020   Learning disability 03/23/2020   Urinary and fecal incontinence 06/14/2019   Chronic pain syndrome 03/23/2019   Constipation 03/23/2019   Dysarthria 03/23/2019   Dysphagia 03/23/2019   Learning difficulty 03/23/2019   Morbid obesity (HCC) 03/23/2019   Muscle weakness 03/23/2019   Vitamin D deficiency 03/23/2019   Bilateral carotid artery stenosis 12/15/2018   Moderate aortic valve stenosis 12/15/2018   BMI 45.0-49.9, adult (HCC) 11/10/2018   Cerebrovascular accident (HCC) 05/02/2016   Hemiparesis affecting right side as late effect of cerebrovascular accident (HCC) 05/01/2016   Brain tumor (HCC) 11/24/2014   Gastro-esophageal reflux disease without esophagitis 11/24/2014   Accident due to mechanical fall without injury 08/03/2014   Essential hypertension 06/13/2014   Mixed hyperlipidemia 06/13/2014   Unspecified sequelae of cerebral infarction 06/13/2014   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/2014   Type 2 diabetes mellitus  without complication (HCC) 01/06/2014   Neck mass 09/08/2013   Swimmer's ear 09/08/2013   Erectile dysfunction 01/03/2013   Prediabetes 01/03/2013   ONSET DATE: 05/09/2016  REFERRING DIAG: P30.648 (ICD-10-CM) - Hemiplegia and hemiparesis following cerebral infarction affecting right dominant side   THERAPY DIAG:  Unsteadiness on feet  Difficulty in walking, not elsewhere classified  Other abnormalities of gait and mobility  Abnormality of gait and mobility  Muscle weakness (generalized)  Other lack of coordination  Repeated falls  Left-sided weakness  Hemiplegia and hemiparesis following cerebral infarction affecting right dominant side (HCC)  Rationale for Evaluation and Treatment Rehabilitation  SUBJECTIVE:  SUBJECTIVE STATEMENT  Patient reports having a good week and states doing pretty good today.   Pt accompanied by: self  PERTINENT HISTORY:   The pt is a pleasant 58 yo male who returns to PT for evaluation of deficits s/o old CVA. Pt with hx of CVA Feb 2018. He was previously fully independent, but CVA caused significant deficits, mainly affecting his right side. Pt reports continued difficulty with walking and transfers. Pt still using WC for majority of mobility, otherwise he uses his RW. Pt lives with his mom who is his main caregiver. Patient had outpatient PT previously in clinic in 2023. He reports since discharge he has been completing his HEP and walking about every day. Pt would like to improve his walking mainly, but also would like to work on balance. He also reports difficulty with transfers. Pt's mom and his caregiver helps him with ADLs. However, he says he is now waiting for a new caregiver. Prior to this pt had caregiver 3x/week. PMH significant for hx of brain tumor,  past CVA, DMII and HTN. See chart for additional information.  PAIN:  Are you having pain? Pt reports no pain currently  PRECAUTIONS: fall  WEIGHT BEARING RESTRICTIONS none  PLOF: required assistance with ADLs (old CVA), prior to CVA in 2018 was indep.  History of Falls: Pt reports no recent falls  Home information: Pt lives with mother who is his primary caregiver. Previously had home aide, 3x/week but reports currently waiting for new one.  PATIENT GOALS: Pt would like to improve his ability to ambulate.  OBJECTIVE:  taken at [facility] unless otherwise noted:   DIAGNOSTIC FINDINGS: No recent imaging per chart  Objective measures taken at eval unless otherwise specified:   Posture: forward head posture, rounded shoulders  MMT LE: grossly  4+/5, exception hip flexors and abductors 4/5 each bilat   Functional tests:  5xSTS:  34 sec, use of BUE (multiple initial attempts with posterior LOB in chair)   : 0.096 m/s with RW, close CGA, and WC follow  Gait for distance (goal 150 ft): deferred due to time  Gait mechanics: impaired, decreased speed (see ), crouched posture, poor postural stability and relies heavily on BUE support on RW. Pt now with bilat AFOS   FOTO: 44 (goal 50)    TODAY'S TREATMENT  04/17/2023   THEREX:  Static stand - march in place with BUE support 2 sets of 10 reps.   Sit to stand with 1 UE on rail of bars and opp UE push up from armrest x 10 reps     NMR:  Activity Description: Standing at edge of // bars- tray table over // bars and patient instructed to tap pod (left UE with red and RUE with green)  Activity Setting:  Random- The Blaze Pod Random setting was chosen to enhance cognitive processing and agility, providing an unpredictable environment to simulate real-world scenarios, and fostering quick reactions and adaptability.   Number of Pods:  6 Cycles/Sets:  12 Duration (Time or Hit Count):  30 sec (1st 5 sets then on last 5 sets)  with rest break after each trial  Patient Stats  Avg reaction time for 1 min trials-2,737 sec and avg of 20 hits  Focusing on increased standing time and cognitive dual task ability.   Same as above but move 2 of pods to floor and patient then incorporated using his Legs along with UE x 1 min x  6 trials with avg reaction 4,681  m/s and 10 hit avg   PATIENT EDUCATION: Education details: Pt educated throughout session about proper posture and technique with exercises. Improved exercise technique, movement at target joints, use of target muscles after min to mod verbal, visual, tactile cues.   Person educated: Patient Education method: Explanation, Demonstration, Tactile cues, and Verbal cues Education comprehension: verbalized understanding, returned demonstration, and verbal cues required  HOME EXERCISE PROGRAM:  Standing hip flexor stretch at counter, prolonged standing with erect posture for gluteal activation, HS curls with RTB   GOALS: Goals reviewed with patient? No  SHORT TERM GOALS: Target date: 11/12/2022  Pt will be independent with HEP in order to improve strength and balance in order to decrease fall risk and improve function at home. Baseline: instructed in HEP today, to provide printout next visit Goal status: MET    LONG TERM GOALS: Target date: 06/12/2023  Pt will increase FOTO to at least 50 in order to demonstrate improvement in function related to mobility and QoL. Baseline: 44 7/29:48 9/10:40 12/24/2022= 51 Goal status: MET  2.  Pt (< 60 yrs old) will complete 5xSTS test < 30 seconds indicating an increase in LE strength and improved balance. Baseline: 10/01/22 34 sec (multiple initial attempts with posterior LOB in chair) 12/20: 49 seconds 11/04/22:1:06, min A on multiple attempts to maintain balance and pt stays in flexed posture throughout test when coming to standing.   9/10: 27.5 sec, Pushes WC post consistently, close CGA  10/22: 47 seconds first  attempt 12/5: 1 minute, 37 seconds, multiple attempts to get first stand, but no difficulty after that; 03/20/2023= 1 min 2 sec with 1UE on walker and 1 on armrest- some posterior lean- Close CGA Goal status: ONGOING  3.  Pt will increase to > 0.5 m/s (30 sec) as to improve gait speed for better home ambulation and reduce fall risk. Baseline: 10/01/22 0.096 m/s with RW, close CGA, and WC follow previously 12/27: 0.11 m/s 11/04/22: .056 m/s  9/10: 076 m/s 10/22: 0.64 m/s  11/26: .076 m/s; 03/20/2023= 0.08 m/s Goal status: ONGOING   4.  Pt will increase hip flexor and abductor  gross strength to 4+/5 bilat as to improve functional strength for independent gait, increased standing tolerance, and increased ADL ability. Baseline: 10/01/22 4+/5, exception hip flexors and abductors 4/5 each bilat  (previously 03/27/22: 4+/5 ) 9/10: Hip flexor 4/5, hip abductor 4+/5 10/22: hip flexor 4/5 hip abductor 4+/5 11/26: hip flexor 4+/5 hip abductor 4+/5 Goal status: MET  5.  Pt will ambulate 150 ft with BRW and WC follow with no rest breaks in less than 12 min to allow for improved mobility within home and increased independence. Baseline: 10/03/22 150' with BRW 04/03/22: 105 ft with BRW 7/29: 100 in 9:30 with pediatric RW 9/10:101 ft with BRW stooped posture worsened throughout test 12/24/2022- 150 feet using BRW, CGA with w/c follow- in 10 min 17 sec. Will keep goal to make sure he can do this consistently 10/22: 119 ft with BRW 8 min 23 seconds terminated by patient  11/26: 148 ft with BRW 14 min 34 seconds terminated by patient 03/20/2023= 152 feet using BRW, CGA and w/c follow in 14 min 3 sec- Need to keep goal active to ensure consistency but will revised and add a time element to goal.  Goal status: Progressing and Revised    ASSESSMENT :  CLINICAL IMPRESSION: Treatment focused on performing mostly standing tasks to improve his overall functional standing endurance and posture. The patient  demonstrated significant progress while utilizing [employer], showcasing improved coordination, balance, and cognitive function. The incorporation of dual-tasking technology with color recognition and association with specific movements in Blaze Pods was strategically chosen to provide a dynamic training environment, enabling the patient to engage in simultaneous physical and cognitive tasks. He was able to demo some in session progress with less reaction time and only CGA with even cross body reaching today.   Pt will continue to benefit from skilled physical therapy intervention to address impairments, improve QOL, and attain therapy goals.    OBJECTIVE IMPAIRMENTS Abnormal gait, decreased activity tolerance, decreased balance, decreased coordination, decreased endurance, decreased mobility, difficulty walking, decreased ROM, decreased strength, improper body mechanics, postural dysfunction, and obesity.   ACTIVITY LIMITATIONS carrying, lifting, bending, sitting, standing, squatting, stairs, transfers, bed mobility, continence, bathing, toileting, dressing, hygiene/grooming, and locomotion level  PARTICIPATION LIMITATIONS: meal prep, cleaning, laundry, interpersonal relationship, driving, shopping, community activity, and yard work  PERSONAL FACTORS Fitness, Past/current experiences, Time since onset of injury/illness/exacerbation, and 3+ comorbidities: hx of brain tumor, past CVA, HTN, obesity, learning disability, DMII  are also affecting patient's functional outcome.   REHAB POTENTIAL: Fair    CLINICAL DECISION MAKING: Stable/uncomplicated  EVALUATION COMPLEXITY: Low  PLAN: PT FREQUENCY: 1-2x/week  PT DURATION: 12 weeks  PLANNED INTERVENTIONS: Therapeutic exercises, Therapeutic activity, Neuromuscular re-education, Balance training, Gait training, Patient/Family education, Joint mobilization, Stair training, Vestibular training, Orthotic/Fit training, DME instructions, Dry Needling,  Electrical stimulation, Wheelchair mobility training, Cryotherapy, Moist heat, Compression bandaging, Manual therapy, and Re-evaluation  PLAN FOR NEXT SESSION:  As much as possible / is tolerated have patient stand with good posture during his rest breaks. continue with strengthening exercises of the LE's, Functional ambulation when appropriate.  Chyrl London, PT Physical Therapist- [place]  [facility]

## 2023-04-22 ENCOUNTER — Ambulatory Visit: Payer: 59

## 2023-04-22 DIAGNOSIS — R2681 Unsteadiness on feet: Secondary | ICD-10-CM

## 2023-04-22 DIAGNOSIS — R278 Other lack of coordination: Secondary | ICD-10-CM

## 2023-04-22 DIAGNOSIS — R262 Difficulty in walking, not elsewhere classified: Secondary | ICD-10-CM

## 2023-04-22 DIAGNOSIS — R269 Unspecified abnormalities of gait and mobility: Secondary | ICD-10-CM

## 2023-04-22 DIAGNOSIS — R2689 Other abnormalities of gait and mobility: Secondary | ICD-10-CM

## 2023-04-22 DIAGNOSIS — M6281 Muscle weakness (generalized): Secondary | ICD-10-CM

## 2023-04-22 NOTE — Therapy (Signed)
 OUTPATIENT PHYSICAL THERAPY NEURO TREATMENT/Physical Therapy Progress Note   Dates of reporting period  03/04/2023   to   04/22/2023    Patient Name: Curtis Powell MRN: [id number] DOB:[birth year], 58 y.o., male Today's Date: 04/23/2023  PCP: Sherial Bail, MD  REFERRING PROVIDER: Sherial Bail, MD    PT End of Session - 04/22/23 1154     Visit Number 50    Number of Visits 67    Date for PT Re-Evaluation 06/12/23    Progress Note Due on Visit 60    PT Start Time 1144    PT Stop Time 1229    PT Time Calculation (min) 45 min    Equipment Utilized During Treatment Gait belt    Activity Tolerance Patient tolerated treatment well;No increased pain    Behavior During Therapy WFL for tasks assessed/performed              Past Medical History:  Diagnosis Date   Asthma    GERD (gastroesophageal reflux disease)    Hyperlipidemia    Hypertension    Obesity    Stroke [facility])    Past Surgical History:  Procedure Laterality Date   BRAIN SURGERY     Patient Active Problem List   Diagnosis Date Noted   Impaired glucose tolerance 03/23/2020   Learning disability 03/23/2020   Urinary and fecal incontinence 06/14/2019   Chronic pain syndrome 03/23/2019   Constipation 03/23/2019   Dysarthria 03/23/2019   Dysphagia 03/23/2019   Learning difficulty 03/23/2019   Morbid obesity (HCC) 03/23/2019   Muscle weakness 03/23/2019   Vitamin D deficiency 03/23/2019   Bilateral carotid artery stenosis 12/15/2018   Moderate aortic valve stenosis 12/15/2018   BMI 45.0-49.9, adult (HCC) 11/10/2018   Cerebrovascular accident (HCC) 05/02/2016   Hemiparesis affecting right side as late effect of cerebrovascular accident (HCC) 05/01/2016   Brain tumor (HCC) 11/24/2014   Gastro-esophageal reflux disease without esophagitis 11/24/2014   Accident due to mechanical fall without injury 08/03/2014   Essential hypertension 06/13/2014   Mixed hyperlipidemia 06/13/2014   Unspecified  sequelae of cerebral infarction 06/13/2014   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/2014   Type 2 diabetes mellitus without complication (HCC) 01/06/2014   Neck mass 09/08/2013   Swimmer's ear 09/08/2013   Erectile dysfunction 01/03/2013   Prediabetes 01/03/2013   ONSET DATE: 05/09/2016  REFERRING DIAG: P30.648 (ICD-10-CM) - Hemiplegia and hemiparesis following cerebral infarction affecting right dominant side   THERAPY DIAG:  Unsteadiness on feet  Difficulty in walking, not elsewhere classified  Other abnormalities of gait and mobility  Abnormality of gait and mobility  Muscle weakness (generalized)  Other lack of coordination  Rationale for Evaluation and Treatment Rehabilitation  SUBJECTIVE:  SUBJECTIVE STATEMENT  Patient reports he has been working out more at home and walking more.  Pt accompanied by: self  PERTINENT HISTORY:   The pt is a pleasant 58 yo male who returns to PT for evaluation of deficits s/o old CVA. Pt with hx of CVA Feb 2018. He was previously fully independent, but CVA caused significant deficits, mainly affecting his right side. Pt reports continued difficulty with walking and transfers. Pt still using WC for majority of mobility, otherwise he uses his RW. Pt lives with his mom who is his main caregiver. Patient had outpatient PT previously in clinic in 2023. He reports since discharge he has been completing his HEP and walking about every day. Pt would like to improve his walking mainly, but also would like to work on balance. He also reports difficulty with transfers. Pt's mom and his caregiver helps him with ADLs. However, he says he is now waiting for a new caregiver. Prior to this pt had caregiver 3x/week. PMH significant for hx of brain tumor, past CVA, DMII and HTN. See chart for  additional information.  PAIN:  Are you having pain? Pt reports no pain currently  PRECAUTIONS: fall  WEIGHT BEARING RESTRICTIONS none  PLOF: required assistance with ADLs (old CVA), prior to CVA in 2018 was indep.  History of Falls: Pt reports no recent falls  Home information: Pt lives with mother who is his primary caregiver. Previously had home aide, 3x/week but reports currently waiting for new one.  PATIENT GOALS: Pt would like to improve his ability to ambulate.  OBJECTIVE:  taken at [facility] unless otherwise noted:   DIAGNOSTIC FINDINGS: No recent imaging per chart  Objective measures taken at eval unless otherwise specified:   Posture: forward head posture, rounded shoulders  MMT LE: grossly  4+/5, exception hip flexors and abductors 4/5 each bilat   Functional tests:  5xSTS:  34 sec, use of BUE (multiple initial attempts with posterior LOB in chair)   : 0.096 m/s with RW, close CGA, and WC follow  Gait for distance (goal 150 ft): deferred due to time  Gait mechanics: impaired, decreased speed (see ), crouched posture, poor postural stability and relies heavily on BUE support on RW. Pt now with bilat AFOS   FOTO: 44 (goal 50)    TODAY'S TREATMENT  04/22/2023  NMR:  Static standing at support bar without UE support x 2 min x 2 trials  THEREX -Minisquats 3 sets x 10 reps - Standing hip abd- 2 sets x 10 reps   Therapeutic activities:   Sit to stand x 3 with no VC for technique- and only min assist on 1st stand then CGA   Physical therapy treatment session today consisted of completing assessment of goals and administration of testing as demonstrated and documented in flow sheet, treatment, and goals section of this note. Addition treatments may be found below.       PATIENT EDUCATION: Education details: Pt educated throughout session about proper posture and technique with exercises. Improved exercise technique, movement at target joints, use  of target muscles after min to mod verbal, visual, tactile cues.   Person educated: Patient Education method: Explanation, Demonstration, Tactile cues, and Verbal cues Education comprehension: verbalized understanding, returned demonstration, and verbal cues required  HOME EXERCISE PROGRAM:  Standing hip flexor stretch at counter, prolonged standing with erect posture for gluteal activation, HS curls with RTB   GOALS: Goals reviewed with patient? No  SHORT TERM GOALS: Target date: 11/12/2022  Pt will be independent with HEP in order to improve strength and balance in order to decrease fall risk and improve function at home. Baseline: instructed in HEP today, to provide printout next visit Goal status: MET    LONG TERM GOALS: Target date: 06/12/2023  Pt will increase FOTO to at least 50 in order to demonstrate improvement in function related to mobility and QoL. Baseline: 44 7/29:48 9/10:40 12/24/2022= 51 Goal status: MET  2.  Pt (< 60 yrs old) will complete 5xSTS test < 30 seconds indicating an increase in LE strength and improved balance. Baseline: 10/01/22 34 sec (multiple initial attempts with posterior LOB in chair) 12/20: 49 seconds 11/04/22:1:06, min A on multiple attempts to maintain balance and pt stays in flexed posture throughout test when coming to standing.   9/10: 27.5 sec, Pushes WC post consistently, close CGA  10/22: 47 seconds first attempt 12/5: 1 minute, 37 seconds, multiple attempts to get first stand, but no difficulty after that; 03/20/2023= 1 min 2 sec with 1UE on walker and 1 on armrest- some posterior lean- Close CGA Goal status: ONGOING  3.  Pt will increase to > 0.5 m/s (30 sec) as to improve gait speed for better home ambulation and reduce fall risk. Baseline: 10/01/22 0.096 m/s with RW, close CGA, and WC follow previously 12/27: 0.11 m/s 11/04/22: .056 m/s  9/10: 076 m/s 10/22: 0.64 m/s  11/26: .076 m/s; 03/20/2023= 0.08 m/s; 04/22/2023= 0.089  m/s Goal status: PROGRESSING  4.  Pt will increase hip flexor and abductor  gross strength to 4+/5 bilat as to improve functional strength for independent gait, increased standing tolerance, and increased ADL ability. Baseline: 10/01/22 4+/5, exception hip flexors and abductors 4/5 each bilat  (previously 03/27/22: 4+/5 ) 9/10: Hip flexor 4/5, hip abductor 4+/5 10/22: hip flexor 4/5 hip abductor 4+/5 11/26: hip flexor 4+/5 hip abductor 4+/5 Goal status: MET  5.  Pt will ambulate 150 ft with BRW and WC follow with no rest breaks in less than 12 min to allow for improved mobility within home and increased independence. Baseline: 10/03/22 150' with BRW 04/03/22: 105 ft with BRW 7/29: 100 in 9:30 with pediatric RW 9/10:101 ft with BRW stooped posture worsened throughout test 12/24/2022- 150 feet using BRW, CGA with w/c follow- in 10 min 17 sec. Will keep goal to make sure he can do this consistently 10/22: 119 ft with BRW 8 min 23 seconds terminated by patient  11/26: 148 ft with BRW 14 min 34 seconds terminated by patient 03/20/2023= 152 feet using BRW, CGA and w/c follow in 14 min 3 sec- Need to keep goal active to ensure consistency but will revised and add a time element to goal. 04/22/2023= 162 feet in 11 min 54 sec using bariatric RW, CGA and close CGA assist Goal status: Progressing and will keep goal active to ensure consistency.     ASSESSMENT :  CLINICAL IMPRESSION: Patient presents with good motivation for his progress note visit. He remained well motivated to demonstrate progress and he in fact did exhibit improving gait speed  and distance. The largest difference was his gait efficiency today as he was able to walk further than previous visits in less time demonstrating more consistency with step length, improving posture with standing and less reliance on BUE Support. Patient's condition has the potential to improve in response to therapy. Maximum improvement is yet to be obtained. The  anticipated improvement is attainable and reasonable in a generally predictable time.  Pt will continue to benefit from skilled physical therapy intervention to address impairments, improve QOL, and attain therapy goals.    OBJECTIVE IMPAIRMENTS Abnormal gait, decreased activity tolerance, decreased balance, decreased coordination, decreased endurance, decreased mobility, difficulty walking, decreased ROM, decreased strength, improper body mechanics, postural dysfunction, and obesity.   ACTIVITY LIMITATIONS carrying, lifting, bending, sitting, standing, squatting, stairs, transfers, bed mobility, continence, bathing, toileting, dressing, hygiene/grooming, and locomotion level  PARTICIPATION LIMITATIONS: meal prep, cleaning, laundry, interpersonal relationship, driving, shopping, community activity, and yard work  PERSONAL FACTORS Fitness, Past/current experiences, Time since onset of injury/illness/exacerbation, and 3+ comorbidities: hx of brain tumor, past CVA, HTN, obesity, learning disability, DMII  are also affecting patient's functional outcome.   REHAB POTENTIAL: Fair    CLINICAL DECISION MAKING: Stable/uncomplicated  EVALUATION COMPLEXITY: Low  PLAN: PT FREQUENCY: 1-2x/week  PT DURATION: 12 weeks  PLANNED INTERVENTIONS: Therapeutic exercises, Therapeutic activity, Neuromuscular re-education, Balance training, Gait training, Patient/Family education, Joint mobilization, Stair training, Vestibular training, Orthotic/Fit training, DME instructions, Dry Needling, Electrical stimulation, Wheelchair mobility training, Cryotherapy, Moist heat, Compression bandaging, Manual therapy, and Re-evaluation  PLAN FOR NEXT SESSION:  As much as possible / is tolerated have patient stand with good posture during his rest breaks. continue with strengthening exercises of the LE's, Functional ambulation when appropriate.  Chyrl London, PT Physical Therapist- [place]  [facility]

## 2023-04-24 ENCOUNTER — Ambulatory Visit: Payer: 59

## 2023-04-24 DIAGNOSIS — R296 Repeated falls: Secondary | ICD-10-CM

## 2023-04-24 DIAGNOSIS — M6281 Muscle weakness (generalized): Secondary | ICD-10-CM

## 2023-04-24 DIAGNOSIS — R262 Difficulty in walking, not elsewhere classified: Secondary | ICD-10-CM

## 2023-04-24 DIAGNOSIS — R278 Other lack of coordination: Secondary | ICD-10-CM

## 2023-04-24 DIAGNOSIS — R2689 Other abnormalities of gait and mobility: Secondary | ICD-10-CM

## 2023-04-24 DIAGNOSIS — R2681 Unsteadiness on feet: Secondary | ICD-10-CM | POA: Diagnosis not present

## 2023-04-24 DIAGNOSIS — R269 Unspecified abnormalities of gait and mobility: Secondary | ICD-10-CM

## 2023-04-24 NOTE — Therapy (Signed)
OUTPATIENT PHYSICAL THERAPY NEURO TREATMENT   Patient Name: Curtis Powell MRN: [id number] DOB:03-08-1966, 58 y.o., male Today's Date: 04/24/2023  PCP: Enid Baas, MD  REFERRING PROVIDER: Enid Baas, MD    PT End of Session - 04/24/23 1409     Visit Number 51    Number of Visits 67    Date for PT Re-Evaluation 06/12/23    Progress Note Due on Visit 60    PT Start Time 1315    PT Stop Time 1400    PT Time Calculation (min) 45 min    Equipment Utilized During Treatment Gait belt    Activity Tolerance Patient tolerated treatment well;No increased pain    Behavior During Therapy WFL for tasks assessed/performed               Past Medical History:  Diagnosis Date   Asthma    GERD (gastroesophageal reflux disease)    Hyperlipidemia    Hypertension    Obesity    Stroke [facility])    Past Surgical History:  Procedure Laterality Date   BRAIN SURGERY     Patient Active Problem List   Diagnosis Date Noted   Impaired glucose tolerance 03/23/2020   Learning disability 03/23/2020   Urinary and fecal incontinence 06/14/2019   Chronic pain syndrome 03/23/2019   Constipation 03/23/2019   Dysarthria 03/23/2019   Dysphagia 03/23/2019   Learning difficulty 03/23/2019   Morbid obesity (HCC) 03/23/2019   Muscle weakness 03/23/2019   Vitamin D deficiency 03/23/2019   Bilateral carotid artery stenosis 12/15/2018   Moderate aortic valve stenosis 12/15/2018   BMI 45.0-49.9, adult (HCC) 11/10/2018   Cerebrovascular accident (HCC) 05/02/2016   Hemiparesis affecting right side as late effect of cerebrovascular accident (HCC) 05/01/2016   Brain tumor (HCC) 11/24/2014   Gastro-esophageal reflux disease without esophagitis 11/24/2014   Accident due to mechanical fall without injury 08/03/2014   Essential hypertension 06/13/2014   Mixed hyperlipidemia 06/13/2014   Unspecified sequelae of cerebral infarction 06/13/2014   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/2014   Type 2 diabetes  mellitus without complication (HCC) 01/06/2014   Neck mass 09/08/2013   Swimmer's ear 09/08/2013   Erectile dysfunction 01/03/2013   Prediabetes 01/03/2013   ONSET DATE: 05/09/2016  REFERRING DIAG: N62.952 (ICD-10-CM) - Hemiplegia and hemiparesis following cerebral infarction affecting right dominant side   THERAPY DIAG:  Unsteadiness on feet  Difficulty in walking, not elsewhere classified  Other abnormalities of gait and mobility  Abnormality of gait and mobility  Muscle weakness (generalized)  Other lack of coordination  Repeated falls  Rationale for Evaluation and Treatment Rehabilitation  SUBJECTIVE:  SUBJECTIVE STATEMENT  Patient reports doing well without any new complaints.  Pt accompanied by: self  PERTINENT HISTORY:   The pt is a pleasant 58 yo male who returns to PT for evaluation of deficits s/o old CVA. Pt with hx of CVA Feb 2018. He was previously fully independent, but CVA caused significant deficits, mainly affecting his right side. Pt reports continued difficulty with walking and transfers. Pt still using WC for majority of mobility, otherwise he uses his RW. Pt lives with his mom who is his main caregiver. Patient had outpatient PT previously in clinic in 2023. He reports since discharge he has been completing his HEP and walking about every day. Pt would like to improve his walking mainly, but also would like to work on balance. He also reports difficulty with transfers. Pt's mom and his caregiver helps him with ADLs. However, he says he is now waiting for a new caregiver. Prior to this pt had caregiver 3x/week. PMH significant for hx of brain tumor, past CVA, DMII and HTN. See chart for additional information.  PAIN:  Are you having pain? Pt reports no pain  currently  PRECAUTIONS: fall  WEIGHT BEARING RESTRICTIONS none  PLOF: required assistance with ADLs (old CVA), prior to CVA in 2018 was indep.  History of Falls: Pt reports no recent falls  Home information: Pt lives with mother who is his primary caregiver. Previously had home aide, 3x/week but reports currently waiting for new one.  PATIENT GOALS: Pt would like to improve his ability to ambulate.  OBJECTIVE:  taken at [facility] unless otherwise noted:   DIAGNOSTIC FINDINGS: No recent imaging per chart  Objective measures taken at eval unless otherwise specified:   Posture: forward head posture, rounded shoulders  MMT LE: grossly  4+/5, exception hip flexors and abductors 4/5 each bilat   Functional tests:  5xSTS:  34 sec, use of BUE (multiple initial attempts with posterior LOB in chair)   : 0.096 m/s with RW, close CGA, and WC follow  Gait for distance (goal 150 ft): deferred due to time  Gait mechanics: impaired, decreased speed (see ), crouched posture, poor postural stability and relies heavily on BUE support on RW. Pt now with bilat AFOS   FOTO: 44 (goal 50)    TODAY'S TREATMENT  04/24/23   NMR:  Static standing at support bar without UE support x 2 min x 2 trials  Therapeutic activities:   Sit to stand x 10 and only CGA  x 2 sets with brief seated rest break   Walking in // bars focusing on increasing step length then retro steps backward- down and back x 2 without seated rest break x 2 separate trials (at best 17 steps forward and 14 steps backward). VC to take as large of a step   Step tap alt LE with BUE support onto 5" step box- 3 sets of 10 reps with brief seated rest break. Patient reported as "Very hard at end"     PATIENT EDUCATION: Education details: Pt educated throughout session about proper posture and technique with exercises. Improved exercise technique, movement at target joints, use of target muscles after min to mod verbal, visual,  tactile cues.   Person educated: Patient Education method: Explanation, Demonstration, Tactile cues, and Verbal cues Education comprehension: verbalized understanding, returned demonstration, and verbal cues required  HOME EXERCISE PROGRAM:  Standing hip flexor stretch at counter, prolonged standing with erect posture for gluteal activation, HS curls with RTB   GOALS: Goals  reviewed with patient? No  SHORT TERM GOALS: Target date: 11/12/2022  Pt will be independent with HEP in order to improve strength and balance in order to decrease fall risk and improve function at home. Baseline: instructed in HEP today, to provide printout next visit Goal status: MET    LONG TERM GOALS: Target date: 06/12/2023  Pt will increase FOTO to at least 50 in order to demonstrate improvement in function related to mobility and QoL. Baseline: 44 7/29:48 9/10:40 12/24/2022= 51 Goal status: MET  2.  Pt (< 60 yrs old) will complete 5xSTS test < 30 seconds indicating an increase in LE strength and improved balance. Baseline: 10/01/22 34 sec (multiple initial attempts with posterior LOB in chair) 12/20: 49 seconds 11/04/22:1:06, min A on multiple attempts to maintain balance and pt stays in flexed posture throughout test when coming to standing.   9/10: 27.5 sec, Pushes WC post consistently, close CGA  10/22: 47 seconds first attempt 12/5: 1 minute, 37 seconds, multiple attempts to get first stand, but no difficulty after that; 03/20/2023= 1 min 2 sec with 1UE on walker and 1 on armrest- some posterior lean- Close CGA Goal status: ONGOING  3.  Pt will increase to > 0.5 m/s (30 sec) as to improve gait speed for better home ambulation and reduce fall risk. Baseline: 10/01/22 0.096 m/s with RW, close CGA, and WC follow previously 12/27: 0.11 m/s 11/04/22: .056 m/s  9/10: 076 m/s 10/22: 0.64 m/s  11/26: .076 m/s; 03/20/2023= 0.08 m/s; 04/22/2023= 0.089 m/s Goal status: PROGRESSING  4.  Pt will increase hip  flexor and abductor  gross strength to 4+/5 bilat as to improve functional strength for independent gait, increased standing tolerance, and increased ADL ability. Baseline: 10/01/22 4+/5, exception hip flexors and abductors 4/5 each bilat  (previously 03/27/22: 4+/5 ) 9/10: Hip flexor 4/5, hip abductor 4+/5 10/22: hip flexor 4/5 hip abductor 4+/5 11/26: hip flexor 4+/5 hip abductor 4+/5 Goal status: MET  5.  Pt will ambulate 150 ft with BRW and WC follow with no rest breaks in less than 12 min to allow for improved mobility within home and increased independence. Baseline: 10/03/22 150' with BRW 04/03/22: 105 ft with BRW 7/29: 100 in 9:30 with pediatric RW 9/10:101 ft with BRW stooped posture worsened throughout test 12/24/2022- 150 feet using BRW, CGA with w/c follow- in 10 min 17 sec. Will keep goal to make sure he can do this consistently 10/22: 119 ft with BRW 8 min 23 seconds terminated by patient  11/26: 148 ft with BRW 14 min 34 seconds terminated by patient 03/20/2023= 152 feet using BRW, CGA and w/c follow in 14 min 3 sec- Need to keep goal active to ensure consistency but will revised and add a time element to goal. 04/22/2023= 162 feet in 11 min 54 sec using bariatric RW, CGA and close CGA assist Goal status: Progressing and will keep goal active to ensure consistency.     ASSESSMENT :  CLINICAL IMPRESSION: Patient presents with good motivation for today's session. Overall he continues to progress with more standing duration throughout session. He is spending more time in erect standing with less overall VC to maintain more erect posture. He was able to vastly improve with retro steps (initially requiring 20 steps from one end of bars to next but improved to 14 steps on last trial).  Pt will continue to benefit from skilled physical therapy intervention to address impairments, improve QOL, and attain therapy goals.  OBJECTIVE IMPAIRMENTS Abnormal gait, decreased activity tolerance,  decreased balance, decreased coordination, decreased endurance, decreased mobility, difficulty walking, decreased ROM, decreased strength, improper body mechanics, postural dysfunction, and obesity.   ACTIVITY LIMITATIONS carrying, lifting, bending, sitting, standing, squatting, stairs, transfers, bed mobility, continence, bathing, toileting, dressing, hygiene/grooming, and locomotion level  PARTICIPATION LIMITATIONS: meal prep, cleaning, laundry, interpersonal relationship, driving, shopping, community activity, and yard work  PERSONAL FACTORS Fitness, Past/current experiences, Time since onset of injury/illness/exacerbation, and 3+ comorbidities: hx of brain tumor, past CVA, HTN, obesity, learning disability, DMII  are also affecting patient's functional outcome.   REHAB POTENTIAL: Fair    CLINICAL DECISION MAKING: Stable/uncomplicated  EVALUATION COMPLEXITY: Low  PLAN: PT FREQUENCY: 1-2x/week  PT DURATION: 12 weeks  PLANNED INTERVENTIONS: Therapeutic exercises, Therapeutic activity, Neuromuscular re-education, Balance training, Gait training, Patient/Family education, Joint mobilization, Stair training, Vestibular training, Orthotic/Fit training, DME instructions, Dry Needling, Electrical stimulation, Wheelchair mobility training, Cryotherapy, Moist heat, Compression bandaging, Manual therapy, and Re-evaluation  PLAN FOR NEXT SESSION:  As much as possible / is tolerated have patient stand with good posture during his rest breaks. continue with strengthening exercises of the LE's, Functional ambulation when appropriate.  Louis Meckel, PT Physical Therapist- [place]  [facility]

## 2023-04-29 ENCOUNTER — Ambulatory Visit: Payer: 59

## 2023-04-29 DIAGNOSIS — R2681 Unsteadiness on feet: Secondary | ICD-10-CM | POA: Diagnosis not present

## 2023-04-29 DIAGNOSIS — M6281 Muscle weakness (generalized): Secondary | ICD-10-CM

## 2023-04-29 DIAGNOSIS — R269 Unspecified abnormalities of gait and mobility: Secondary | ICD-10-CM

## 2023-04-29 DIAGNOSIS — R2689 Other abnormalities of gait and mobility: Secondary | ICD-10-CM

## 2023-04-29 DIAGNOSIS — R278 Other lack of coordination: Secondary | ICD-10-CM

## 2023-04-29 DIAGNOSIS — R262 Difficulty in walking, not elsewhere classified: Secondary | ICD-10-CM

## 2023-04-29 NOTE — Therapy (Signed)
OUTPATIENT PHYSICAL THERAPY NEURO TREATMENT   Patient Name: Curtis Powell MRN: [id number] DOB:1965-04-18, 58 y.o., male Today's Date: 04/29/2023  PCP: Enid Baas, MD  REFERRING PROVIDER: Enid Baas, MD    PT End of Session - 04/29/23 1310     Visit Number 52    Number of Visits 67    Date for PT Re-Evaluation 06/12/23    Progress Note Due on Visit 60    PT Start Time 1310    PT Stop Time 1356    PT Time Calculation (min) 46 min    Equipment Utilized During Treatment Gait belt    Activity Tolerance Patient tolerated treatment well;No increased pain    Behavior During Therapy WFL for tasks assessed/performed               Past Medical History:  Diagnosis Date   Asthma    GERD (gastroesophageal reflux disease)    Hyperlipidemia    Hypertension    Obesity    Stroke [facility])    Past Surgical History:  Procedure Laterality Date   BRAIN SURGERY     Patient Active Problem List   Diagnosis Date Noted   Impaired glucose tolerance 03/23/2020   Learning disability 03/23/2020   Urinary and fecal incontinence 06/14/2019   Chronic pain syndrome 03/23/2019   Constipation 03/23/2019   Dysarthria 03/23/2019   Dysphagia 03/23/2019   Learning difficulty 03/23/2019   Morbid obesity (HCC) 03/23/2019   Muscle weakness 03/23/2019   Vitamin D deficiency 03/23/2019   Bilateral carotid artery stenosis 12/15/2018   Moderate aortic valve stenosis 12/15/2018   BMI 45.0-49.9, adult (HCC) 11/10/2018   Cerebrovascular accident (HCC) 05/02/2016   Hemiparesis affecting right side as late effect of cerebrovascular accident (HCC) 05/01/2016   Brain tumor (HCC) 11/24/2014   Gastro-esophageal reflux disease without esophagitis 11/24/2014   Accident due to mechanical fall without injury 08/03/2014   Essential hypertension 06/13/2014   Mixed hyperlipidemia 06/13/2014   Unspecified sequelae of cerebral infarction 06/13/2014   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/2014   Type 2 diabetes  mellitus without complication (HCC) 01/06/2014   Neck mass 09/08/2013   Swimmer's ear 09/08/2013   Erectile dysfunction 01/03/2013   Prediabetes 01/03/2013   ONSET DATE: 05/09/2016  REFERRING DIAG: W09.811 (ICD-10-CM) - Hemiplegia and hemiparesis following cerebral infarction affecting right dominant side   THERAPY DIAG:  Unsteadiness on feet  Difficulty in walking, not elsewhere classified  Other abnormalities of gait and mobility  Abnormality of gait and mobility  Muscle weakness (generalized)  Other lack of coordination  Rationale for Evaluation and Treatment Rehabilitation  SUBJECTIVE:  SUBJECTIVE STATEMENT  Patient reports he has been standing and walking more at home.  Pt accompanied by: self  PERTINENT HISTORY:   The pt is a pleasant 58 yo male who returns to PT for evaluation of deficits s/o old CVA. Pt with hx of CVA Feb 2018. He was previously fully independent, but CVA caused significant deficits, mainly affecting his right side. Pt reports continued difficulty with walking and transfers. Pt still using WC for majority of mobility, otherwise he uses his RW. Pt lives with his mom who is his main caregiver. Patient had outpatient PT previously in clinic in 2023. He reports since discharge he has been completing his HEP and walking about every day. Pt would like to improve his walking mainly, but also would like to work on balance. He also reports difficulty with transfers. Pt's mom and his caregiver helps him with ADLs. However, he says he is now waiting for a new caregiver. Prior to this pt had caregiver 3x/week. PMH significant for hx of brain tumor, past CVA, DMII and HTN. See chart for additional information.  PAIN:  Are you having pain? Pt reports no pain currently  PRECAUTIONS:  fall  WEIGHT BEARING RESTRICTIONS none  PLOF: required assistance with ADLs (old CVA), prior to CVA in 2018 was indep.  History of Falls: Pt reports no recent falls  Home information: Pt lives with mother who is his primary caregiver. Previously had home aide, 3x/week but reports currently waiting for new one.  PATIENT GOALS: Pt would like to improve his ability to ambulate.  OBJECTIVE:  taken at [facility] unless otherwise noted:   DIAGNOSTIC FINDINGS: No recent imaging per chart  Objective measures taken at eval unless otherwise specified:   Posture: forward head posture, rounded shoulders  MMT LE: grossly  4+/5, exception hip flexors and abductors 4/5 each bilat   Functional tests:  5xSTS:  34 sec, use of BUE (multiple initial attempts with posterior LOB in chair)   : 0.096 m/s with RW, close CGA, and WC follow  Gait for distance (goal 150 ft): deferred due to time  Gait mechanics: impaired, decreased speed (see ), crouched posture, poor postural stability and relies heavily on BUE support on RW. Pt now with bilat AFOS   FOTO: 44 (goal 50)    TODAY'S TREATMENT  04/29/23   NMR:  Walking trials in // bars focusing on increasing step length then retro steps backward- down and back Trial 1: warm up and did not count steps Trial 2: 17 steps fwd; 20 steps bwd Trial3:  15 steps fwd; 19 steps bwd Trial 4: 17 steps fwd; 26 steps bwd Trial 5 19 steps fwd; 20 steps bwd Trial 6: 17 steps fwd; 22 steps bwd     Static standing at support bar without UE support x 1 min 43 sec and 3 sec on 2nd trial.   Therapeutic activities:   Sit to stand x 10 and only CGA  x 1 set        PATIENT EDUCATION: Education details: Pt educated throughout session about proper posture and technique with exercises. Improved exercise technique, movement at target joints, use of target muscles after min to mod verbal, visual, tactile cues.   Person educated: Patient Education  method: Explanation, Demonstration, Tactile cues, and Verbal cues Education comprehension: verbalized understanding, returned demonstration, and verbal cues required  HOME EXERCISE PROGRAM:  Standing hip flexor stretch at counter, prolonged standing with erect posture for gluteal activation, HS curls with RTB  GOALS: Goals reviewed with patient? No  SHORT TERM GOALS: Target date: 11/12/2022  Pt will be independent with HEP in order to improve strength and balance in order to decrease fall risk and improve function at home. Baseline: instructed in HEP today, to provide printout next visit Goal status: MET    LONG TERM GOALS: Target date: 06/12/2023  Pt will increase FOTO to at least 50 in order to demonstrate improvement in function related to mobility and QoL. Baseline: 44 7/29:48 9/10:40 12/24/2022= 51 Goal status: MET  2.  Pt (< 60 yrs old) will complete 5xSTS test < 30 seconds indicating an increase in LE strength and improved balance. Baseline: 10/01/22 34 sec (multiple initial attempts with posterior LOB in chair) 12/20: 49 seconds 11/04/22:1:06, min A on multiple attempts to maintain balance and pt stays in flexed posture throughout test when coming to standing.   9/10: 27.5 sec, Pushes WC post consistently, close CGA  10/22: 47 seconds first attempt 12/5: 1 minute, 37 seconds, multiple attempts to get first stand, but no difficulty after that; 03/20/2023= 1 min 2 sec with 1UE on walker and 1 on armrest- some posterior lean- Close CGA Goal status: ONGOING  3.  Pt will increase to > 0.5 m/s (30 sec) as to improve gait speed for better home ambulation and reduce fall risk. Baseline: 10/01/22 0.096 m/s with RW, close CGA, and WC follow previously 12/27: 0.11 m/s 11/04/22: .056 m/s  9/10: 076 m/s 10/22: 0.64 m/s  11/26: .076 m/s; 03/20/2023= 0.08 m/s; 04/22/2023= 0.089 m/s Goal status: PROGRESSING  4.  Pt will increase hip flexor and abductor  gross strength to 4+/5 bilat as to  improve functional strength for independent gait, increased standing tolerance, and increased ADL ability. Baseline: 10/01/22 4+/5, exception hip flexors and abductors 4/5 each bilat  (previously 03/27/22: 4+/5 ) 9/10: Hip flexor 4/5, hip abductor 4+/5 10/22: hip flexor 4/5 hip abductor 4+/5 11/26: hip flexor 4+/5 hip abductor 4+/5 Goal status: MET  5.  Pt will ambulate 150 ft with BRW and WC follow with no rest breaks in less than 12 min to allow for improved mobility within home and increased independence. Baseline: 10/03/22 150' with BRW 04/03/22: 105 ft with BRW 7/29: 100 in 9:30 with pediatric RW 9/10:101 ft with BRW stooped posture worsened throughout test 12/24/2022- 150 feet using BRW, CGA with w/c follow- in 10 min 17 sec. Will keep goal to make sure he can do this consistently 10/22: 119 ft with BRW 8 min 23 seconds terminated by patient  11/26: 148 ft with BRW 14 min 34 seconds terminated by patient 03/20/2023= 152 feet using BRW, CGA and w/c follow in 14 min 3 sec- Need to keep goal active to ensure consistency but will revised and add a time element to goal. 04/22/2023= 162 feet in 11 min 54 sec using bariatric RW, CGA and close CGA assist Goal status: Progressing and will keep goal active to ensure consistency.     ASSESSMENT :  CLINICAL IMPRESSION: Patient presents with good motivation today. He did struggle with step length and worsened posture and steps with fatigue despite VC. Patient was on his feet more during today's session and presents with fatigue as limiting factor. Overall he is standing much longer and continues to work hard to improve.   Pt will continue to benefit from skilled physical therapy intervention to address impairments, improve QOL, and attain therapy goals.    OBJECTIVE IMPAIRMENTS Abnormal gait, decreased activity tolerance, decreased balance, decreased  coordination, decreased endurance, decreased mobility, difficulty walking, decreased ROM, decreased  strength, improper body mechanics, postural dysfunction, and obesity.   ACTIVITY LIMITATIONS carrying, lifting, bending, sitting, standing, squatting, stairs, transfers, bed mobility, continence, bathing, toileting, dressing, hygiene/grooming, and locomotion level  PARTICIPATION LIMITATIONS: meal prep, cleaning, laundry, interpersonal relationship, driving, shopping, community activity, and yard work  PERSONAL FACTORS Fitness, Past/current experiences, Time since onset of injury/illness/exacerbation, and 3+ comorbidities: hx of brain tumor, past CVA, HTN, obesity, learning disability, DMII  are also affecting patient's functional outcome.   REHAB POTENTIAL: Fair    CLINICAL DECISION MAKING: Stable/uncomplicated  EVALUATION COMPLEXITY: Low  PLAN: PT FREQUENCY: 1-2x/week  PT DURATION: 12 weeks  PLANNED INTERVENTIONS: Therapeutic exercises, Therapeutic activity, Neuromuscular re-education, Balance training, Gait training, Patient/Family education, Joint mobilization, Stair training, Vestibular training, Orthotic/Fit training, DME instructions, Dry Needling, Electrical stimulation, Wheelchair mobility training, Cryotherapy, Moist heat, Compression bandaging, Manual therapy, and Re-evaluation  PLAN FOR NEXT SESSION:  As much as possible / is tolerated have patient stand with good posture during his rest breaks. continue with strengthening exercises of the LE's, Functional ambulation when appropriate.  Louis Meckel, PT Physical Therapist- [place]  [facility]

## 2023-05-01 ENCOUNTER — Ambulatory Visit: Payer: 59

## 2023-05-01 DIAGNOSIS — M6281 Muscle weakness (generalized): Secondary | ICD-10-CM

## 2023-05-01 DIAGNOSIS — R262 Difficulty in walking, not elsewhere classified: Secondary | ICD-10-CM

## 2023-05-01 DIAGNOSIS — R278 Other lack of coordination: Secondary | ICD-10-CM

## 2023-05-01 DIAGNOSIS — R2681 Unsteadiness on feet: Secondary | ICD-10-CM

## 2023-05-01 DIAGNOSIS — R2689 Other abnormalities of gait and mobility: Secondary | ICD-10-CM

## 2023-05-01 DIAGNOSIS — R269 Unspecified abnormalities of gait and mobility: Secondary | ICD-10-CM

## 2023-05-01 NOTE — Therapy (Signed)
OUTPATIENT PHYSICAL THERAPY NEURO TREATMENT   Patient Name: Curtis Powell MRN: [id number] DOB:20-Nov-1965, 58 y.o., male Today's Date: 05/02/2023  PCP: Enid Baas, MD  REFERRING PROVIDER: Enid Baas, MD    PT End of Session - 05/01/23 1313     Visit Number 53    Number of Visits 67    Date for PT Re-Evaluation 06/12/23    Progress Note Due on Visit 60    PT Start Time 1314    PT Stop Time 1357    PT Time Calculation (min) 43 min    Equipment Utilized During Treatment Gait belt    Activity Tolerance Patient tolerated treatment well;No increased pain    Behavior During Therapy WFL for tasks assessed/performed               Past Medical History:  Diagnosis Date   Asthma    GERD (gastroesophageal reflux disease)    Hyperlipidemia    Hypertension    Obesity    Stroke [facility])    Past Surgical History:  Procedure Laterality Date   BRAIN SURGERY     Patient Active Problem List   Diagnosis Date Noted   Impaired glucose tolerance 03/23/2020   Learning disability 03/23/2020   Urinary and fecal incontinence 06/14/2019   Chronic pain syndrome 03/23/2019   Constipation 03/23/2019   Dysarthria 03/23/2019   Dysphagia 03/23/2019   Learning difficulty 03/23/2019   Morbid obesity (HCC) 03/23/2019   Muscle weakness 03/23/2019   Vitamin D deficiency 03/23/2019   Bilateral carotid artery stenosis 12/15/2018   Moderate aortic valve stenosis 12/15/2018   BMI 45.0-49.9, adult (HCC) 11/10/2018   Cerebrovascular accident (HCC) 05/02/2016   Hemiparesis affecting right side as late effect of cerebrovascular accident (HCC) 05/01/2016   Brain tumor (HCC) 11/24/2014   Gastro-esophageal reflux disease without esophagitis 11/24/2014   Accident due to mechanical fall without injury 08/03/2014   Essential hypertension 06/13/2014   Mixed hyperlipidemia 06/13/2014   Unspecified sequelae of cerebral infarction 06/13/2014   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/2014   Type 2 diabetes  mellitus without complication (HCC) 01/06/2014   Neck mass 09/08/2013   Swimmer's ear 09/08/2013   Erectile dysfunction 01/03/2013   Prediabetes 01/03/2013   ONSET DATE: 05/09/2016  REFERRING DIAG: W09.811 (ICD-10-CM) - Hemiplegia and hemiparesis following cerebral infarction affecting right dominant side   THERAPY DIAG:  Unsteadiness on feet  Difficulty in walking, not elsewhere classified  Other abnormalities of gait and mobility  Abnormality of gait and mobility  Muscle weakness (generalized)  Other lack of coordination  Rationale for Evaluation and Treatment Rehabilitation  SUBJECTIVE:  SUBJECTIVE STATEMENT  Patient reports he has been standing and walking more at home.  Pt accompanied by: self  PERTINENT HISTORY:   The pt is a pleasant 58 yo male who returns to PT for evaluation of deficits s/o old CVA. Pt with hx of CVA Feb 2018. He was previously fully independent, but CVA caused significant deficits, mainly affecting his right side. Pt reports continued difficulty with walking and transfers. Pt still using WC for majority of mobility, otherwise he uses his RW. Pt lives with his mom who is his main caregiver. Patient had outpatient PT previously in clinic in 2023. He reports since discharge he has been completing his HEP and walking about every day. Pt would like to improve his walking mainly, but also would like to work on balance. He also reports difficulty with transfers. Pt's mom and his caregiver helps him with ADLs. However, he says he is now waiting for a new caregiver. Prior to this pt had caregiver 3x/week. PMH significant for hx of brain tumor, past CVA, DMII and HTN. See chart for additional information.  PAIN:  Are you having pain? Pt reports no pain currently  PRECAUTIONS:  fall  WEIGHT BEARING RESTRICTIONS none  PLOF: required assistance with ADLs (old CVA), prior to CVA in 2018 was indep.  History of Falls: Pt reports no recent falls  Home information: Pt lives with mother who is his primary caregiver. Previously had home aide, 3x/week but reports currently waiting for new one.  PATIENT GOALS: Pt would like to improve his ability to ambulate.  OBJECTIVE:  taken at [facility] unless otherwise noted:   DIAGNOSTIC FINDINGS: No recent imaging per chart  Objective measures taken at eval unless otherwise specified:   Posture: forward head posture, rounded shoulders  MMT LE: grossly  4+/5, exception hip flexors and abductors 4/5 each bilat   Functional tests:  5xSTS:  34 sec, use of BUE (multiple initial attempts with posterior LOB in chair)   : 0.096 m/s with RW, close CGA, and WC follow  Gait for distance (goal 150 ft): deferred due to time  Gait mechanics: impaired, decreased speed (see ), crouched posture, poor postural stability and relies heavily on BUE support on RW. Pt now with bilat AFOS   FOTO: 44 (goal 50)    TODAY'S TREATMENT  05/02/23   Therex:  Warm up - seated hip march in chair - (VC that feet have to clear floor or does not count) - 2 sets x 10 reps LAQ- alt LE - 2 x 10 reps  Seated ham curl- using sliding disc 2 x 10 reps each LE    NMR:   Word game - at support bar- static stand with 1 UE support while other UE picking up magnet letters and placing them into corresponding circles- grouped by color- focusing on Overhead reaching and erect standing- x 15 min    Static standing at support bar without UE support x 1 min 43 sec and 3 sec on 2nd trial.   Therapeutic activities:   Sit to stand with 1 UE pulling from support bar and 1UE pushing up from armrest - CGA  x 2 sets x 10 reps         PATIENT EDUCATION: Education details: Pt educated throughout session about proper posture and technique with  exercises. Improved exercise technique, movement at target joints, use of target muscles after min to mod verbal, visual, tactile cues.   Person educated: Patient Education method: Explanation, Demonstration, Tactile  cues, and Verbal cues Education comprehension: verbalized understanding, returned demonstration, and verbal cues required  HOME EXERCISE PROGRAM:  Standing hip flexor stretch at counter, prolonged standing with erect posture for gluteal activation, HS curls with RTB   GOALS: Goals reviewed with patient? No  SHORT TERM GOALS: Target date: 11/12/2022  Pt will be independent with HEP in order to improve strength and balance in order to decrease fall risk and improve function at home. Baseline: instructed in HEP today, to provide printout next visit Goal status: MET    LONG TERM GOALS: Target date: 06/12/2023  Pt will increase FOTO to at least 50 in order to demonstrate improvement in function related to mobility and QoL. Baseline: 44 7/29:48 9/10:40 12/24/2022= 51 Goal status: MET  2.  Pt (< 60 yrs old) will complete 5xSTS test < 30 seconds indicating an increase in LE strength and improved balance. Baseline: 10/01/22 34 sec (multiple initial attempts with posterior LOB in chair) 12/20: 49 seconds 11/04/22:1:06, min A on multiple attempts to maintain balance and pt stays in flexed posture throughout test when coming to standing.   9/10: 27.5 sec, Pushes WC post consistently, close CGA  10/22: 47 seconds first attempt 12/5: 1 minute, 37 seconds, multiple attempts to get first stand, but no difficulty after that; 03/20/2023= 1 min 2 sec with 1UE on walker and 1 on armrest- some posterior lean- Close CGA Goal status: ONGOING  3.  Pt will increase to > 0.5 m/s (30 sec) as to improve gait speed for better home ambulation and reduce fall risk. Baseline: 10/01/22 0.096 m/s with RW, close CGA, and WC follow previously 12/27: 0.11 m/s 11/04/22: .056 m/s  9/10: 076 m/s 10/22: 0.64  m/s  11/26: .076 m/s; 03/20/2023= 0.08 m/s; 04/22/2023= 0.089 m/s Goal status: PROGRESSING  4.  Pt will increase hip flexor and abductor  gross strength to 4+/5 bilat as to improve functional strength for independent gait, increased standing tolerance, and increased ADL ability. Baseline: 10/01/22 4+/5, exception hip flexors and abductors 4/5 each bilat  (previously 03/27/22: 4+/5 ) 9/10: Hip flexor 4/5, hip abductor 4+/5 10/22: hip flexor 4/5 hip abductor 4+/5 11/26: hip flexor 4+/5 hip abductor 4+/5 Goal status: MET  5.  Pt will ambulate 150 ft with BRW and WC follow with no rest breaks in less than 12 min to allow for improved mobility within home and increased independence. Baseline: 10/03/22 150' with BRW 04/03/22: 105 ft with BRW 7/29: 100 in 9:30 with pediatric RW 9/10:101 ft with BRW stooped posture worsened throughout test 12/24/2022- 150 feet using BRW, CGA with w/c follow- in 10 min 17 sec. Will keep goal to make sure he can do this consistently 10/22: 119 ft with BRW 8 min 23 seconds terminated by patient  11/26: 148 ft with BRW 14 min 34 seconds terminated by patient 03/20/2023= 152 feet using BRW, CGA and w/c follow in 14 min 3 sec- Need to keep goal active to ensure consistency but will revised and add a time element to goal. 04/22/2023= 162 feet in 11 min 54 sec using bariatric RW, CGA and close CGA assist Goal status: Progressing and will keep goal active to ensure consistency.     ASSESSMENT :  CLINICAL IMPRESSION: Treatment focused on functional standing - weight bearing through his LE and attempting to maintain erect posture. He struggled some at times looking ahead during word game but overall making progress with less forward trunk lean with all standing.  Pt will continue to benefit  from skilled physical therapy intervention to address impairments, improve QOL, and attain therapy goals.    OBJECTIVE IMPAIRMENTS Abnormal gait, decreased activity tolerance, decreased  balance, decreased coordination, decreased endurance, decreased mobility, difficulty walking, decreased ROM, decreased strength, improper body mechanics, postural dysfunction, and obesity.   ACTIVITY LIMITATIONS carrying, lifting, bending, sitting, standing, squatting, stairs, transfers, bed mobility, continence, bathing, toileting, dressing, hygiene/grooming, and locomotion level  PARTICIPATION LIMITATIONS: meal prep, cleaning, laundry, interpersonal relationship, driving, shopping, community activity, and yard work  PERSONAL FACTORS Fitness, Past/current experiences, Time since onset of injury/illness/exacerbation, and 3+ comorbidities: hx of brain tumor, past CVA, HTN, obesity, learning disability, DMII  are also affecting patient's functional outcome.   REHAB POTENTIAL: Fair    CLINICAL DECISION MAKING: Stable/uncomplicated  EVALUATION COMPLEXITY: Low  PLAN: PT FREQUENCY: 1-2x/week  PT DURATION: 12 weeks  PLANNED INTERVENTIONS: Therapeutic exercises, Therapeutic activity, Neuromuscular re-education, Balance training, Gait training, Patient/Family education, Joint mobilization, Stair training, Vestibular training, Orthotic/Fit training, DME instructions, Dry Needling, Electrical stimulation, Wheelchair mobility training, Cryotherapy, Moist heat, Compression bandaging, Manual therapy, and Re-evaluation  PLAN FOR NEXT SESSION:  As much as possible / is tolerated have patient stand with good posture during his rest breaks. continue with strengthening exercises of the LE's, Functional ambulation when appropriate.  Louis Meckel, PT Physical Therapist- [place]  [facility]

## 2023-05-06 ENCOUNTER — Ambulatory Visit: Payer: 59

## 2023-05-06 DIAGNOSIS — R2681 Unsteadiness on feet: Secondary | ICD-10-CM

## 2023-05-06 DIAGNOSIS — R262 Difficulty in walking, not elsewhere classified: Secondary | ICD-10-CM

## 2023-05-06 DIAGNOSIS — R2689 Other abnormalities of gait and mobility: Secondary | ICD-10-CM

## 2023-05-06 DIAGNOSIS — R278 Other lack of coordination: Secondary | ICD-10-CM

## 2023-05-06 DIAGNOSIS — M6281 Muscle weakness (generalized): Secondary | ICD-10-CM

## 2023-05-06 DIAGNOSIS — R269 Unspecified abnormalities of gait and mobility: Secondary | ICD-10-CM

## 2023-05-06 DIAGNOSIS — R296 Repeated falls: Secondary | ICD-10-CM

## 2023-05-06 NOTE — Therapy (Signed)
OUTPATIENT PHYSICAL THERAPY NEURO TREATMENT   Patient Name: Curtis Powell MRN: [id number] DOB:1965/04/09, 58 y.o., male Today's Date: 05/07/2023  PCP: Enid Baas, MD  REFERRING PROVIDER: Enid Baas, MD    PT End of Session - 05/06/23 1322     Visit Number 54    Number of Visits 67    Date for PT Re-Evaluation 06/12/23    Progress Note Due on Visit 60    PT Start Time 1315    PT Stop Time 1356    PT Time Calculation (min) 41 min    Equipment Utilized During Treatment Gait belt    Activity Tolerance Patient tolerated treatment well;No increased pain    Behavior During Therapy WFL for tasks assessed/performed               Past Medical History:  Diagnosis Date   Asthma    GERD (gastroesophageal reflux disease)    Hyperlipidemia    Hypertension    Obesity    Stroke [facility])    Past Surgical History:  Procedure Laterality Date   BRAIN SURGERY     Patient Active Problem List   Diagnosis Date Noted   Impaired glucose tolerance 03/23/2020   Learning disability 03/23/2020   Urinary and fecal incontinence 06/14/2019   Chronic pain syndrome 03/23/2019   Constipation 03/23/2019   Dysarthria 03/23/2019   Dysphagia 03/23/2019   Learning difficulty 03/23/2019   Morbid obesity (HCC) 03/23/2019   Muscle weakness 03/23/2019   Vitamin D deficiency 03/23/2019   Bilateral carotid artery stenosis 12/15/2018   Moderate aortic valve stenosis 12/15/2018   BMI 45.0-49.9, adult (HCC) 11/10/2018   Cerebrovascular accident (HCC) 05/02/2016   Hemiparesis affecting right side as late effect of cerebrovascular accident (HCC) 05/01/2016   Brain tumor (HCC) 11/24/2014   Gastro-esophageal reflux disease without esophagitis 11/24/2014   Accident due to mechanical fall without injury 08/03/2014   Essential hypertension 06/13/2014   Mixed hyperlipidemia 06/13/2014   Unspecified sequelae of cerebral infarction 06/13/2014   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/2014   Type 2 diabetes  mellitus without complication (HCC) 01/06/2014   Neck mass 09/08/2013   Swimmer's ear 09/08/2013   Erectile dysfunction 01/03/2013   Prediabetes 01/03/2013   ONSET DATE: 05/09/2016  REFERRING DIAG: O75.643 (ICD-10-CM) - Hemiplegia and hemiparesis following cerebral infarction affecting right dominant side   THERAPY DIAG:  Unsteadiness on feet  Difficulty in walking, not elsewhere classified  Other abnormalities of gait and mobility  Abnormality of gait and mobility  Muscle weakness (generalized)  Other lack of coordination  Repeated falls  Rationale for Evaluation and Treatment Rehabilitation  SUBJECTIVE:  SUBJECTIVE STATEMENT  Patient reports a fall over the weekend- standing in bathing at sink washing up and reports his right leg "gave way" - He denies any injuries.   Pt accompanied by: self  PERTINENT HISTORY:   The pt is a pleasant 58 yo male who returns to PT for evaluation of deficits s/o old CVA. Pt with hx of CVA Feb 2018. He was previously fully independent, but CVA caused significant deficits, mainly affecting his right side. Pt reports continued difficulty with walking and transfers. Pt still using WC for majority of mobility, otherwise he uses his RW. Pt lives with his mom who is his main caregiver. Patient had outpatient PT previously in clinic in 2023. He reports since discharge he has been completing his HEP and walking about every day. Pt would like to improve his walking mainly, but also would like to work on balance. He also reports difficulty with transfers. Pt's mom and his caregiver helps him with ADLs. However, he says he is now waiting for a new caregiver. Prior to this pt had caregiver 3x/week. PMH significant for hx of brain tumor, past CVA, DMII and HTN. See chart for  additional information.  PAIN:  Are you having pain? Pt reports no pain currently  PRECAUTIONS: fall  WEIGHT BEARING RESTRICTIONS none  PLOF: required assistance with ADLs (old CVA), prior to CVA in 2018 was indep.  History of Falls: Pt reports no recent falls  Home information: Pt lives with mother who is his primary caregiver. Previously had home aide, 3x/week but reports currently waiting for new one.  PATIENT GOALS: Pt would like to improve his ability to ambulate.  OBJECTIVE:  taken at [facility] unless otherwise noted:   DIAGNOSTIC FINDINGS: No recent imaging per chart  Objective measures taken at eval unless otherwise specified:   Posture: forward head posture, rounded shoulders  MMT LE: grossly  4+/5, exception hip flexors and abductors 4/5 each bilat   Functional tests:  5xSTS:  34 sec, use of BUE (multiple initial attempts with posterior LOB in chair)   : 0.096 m/s with RW, close CGA, and WC follow  Gait for distance (goal 150 ft): deferred due to time  Gait mechanics: impaired, decreased speed (see ), crouched posture, poor postural stability and relies heavily on BUE support on RW. Pt now with bilat AFOS   FOTO: 44 (goal 50)    TODAY'S TREATMENT  05/07/23   Therex:  Warm up - seated hip march in chair - (VC that feet have to clear floor or does not count) - 2 sets x 10 reps LAQ- alt LE - 2 x 10 reps  Seated hip abd in chair 2sets of 10 reps     NMR:   Gait training-focusing on gait sequencing and Posture- Attempting to stand more erect and less pressure through BUE when walking- able to complete 150 feet in 12 min 33 sec.       Therapeutic activities:   Sit to stand with 1 UE on walker and 1UE pushing up from armrest - CGA  x 2 sets x 10 reps         PATIENT EDUCATION: Education details: Pt educated throughout session about proper posture and technique with exercises. Improved exercise technique, movement at target joints, use of  target muscles after min to mod verbal, visual, tactile cues.   Person educated: Patient Education method: Explanation, Demonstration, Tactile cues, and Verbal cues Education comprehension: verbalized understanding, returned demonstration, and verbal cues required  HOME  EXERCISE PROGRAM:  Standing hip flexor stretch at counter, prolonged standing with erect posture for gluteal activation, HS curls with RTB   GOALS: Goals reviewed with patient? No  SHORT TERM GOALS: Target date: 11/12/2022  Pt will be independent with HEP in order to improve strength and balance in order to decrease fall risk and improve function at home. Baseline: instructed in HEP today, to provide printout next visit Goal status: MET    LONG TERM GOALS: Target date: 06/12/2023  Pt will increase FOTO to at least 50 in order to demonstrate improvement in function related to mobility and QoL. Baseline: 44 7/29:48 9/10:40 12/24/2022= 51 Goal status: MET  2.  Pt (< 60 yrs old) will complete 5xSTS test < 30 seconds indicating an increase in LE strength and improved balance. Baseline: 10/01/22 34 sec (multiple initial attempts with posterior LOB in chair) 12/20: 49 seconds 11/04/22:1:06, min A on multiple attempts to maintain balance and pt stays in flexed posture throughout test when coming to standing.   9/10: 27.5 sec, Pushes WC post consistently, close CGA  10/22: 47 seconds first attempt 12/5: 1 minute, 37 seconds, multiple attempts to get first stand, but no difficulty after that; 03/20/2023= 1 min 2 sec with 1UE on walker and 1 on armrest- some posterior lean- Close CGA Goal status: ONGOING  3.  Pt will increase to > 0.5 m/s (30 sec) as to improve gait speed for better home ambulation and reduce fall risk. Baseline: 10/01/22 0.096 m/s with RW, close CGA, and WC follow previously 12/27: 0.11 m/s 11/04/22: .056 m/s  9/10: 076 m/s 10/22: 0.64 m/s  11/26: .076 m/s; 03/20/2023= 0.08 m/s; 04/22/2023= 0.089 m/s Goal  status: PROGRESSING  4.  Pt will increase hip flexor and abductor  gross strength to 4+/5 bilat as to improve functional strength for independent gait, increased standing tolerance, and increased ADL ability. Baseline: 10/01/22 4+/5, exception hip flexors and abductors 4/5 each bilat  (previously 03/27/22: 4+/5 ) 9/10: Hip flexor 4/5, hip abductor 4+/5 10/22: hip flexor 4/5 hip abductor 4+/5 11/26: hip flexor 4+/5 hip abductor 4+/5 Goal status: MET  5.  Pt will ambulate 150 ft with BRW and WC follow with no rest breaks in less than 12 min to allow for improved mobility within home and increased independence. Baseline: 10/03/22 150' with BRW 04/03/22: 105 ft with BRW 7/29: 100 in 9:30 with pediatric RW 9/10:101 ft with BRW stooped posture worsened throughout test 12/24/2022- 150 feet using BRW, CGA with w/c follow- in 10 min 17 sec. Will keep goal to make sure he can do this consistently 10/22: 119 ft with BRW 8 min 23 seconds terminated by patient  11/26: 148 ft with BRW 14 min 34 seconds terminated by patient 03/20/2023= 152 feet using BRW, CGA and w/c follow in 14 min 3 sec- Need to keep goal active to ensure consistency but will revised and add a time element to goal. 04/22/2023= 162 feet in 11 min 54 sec using bariatric RW, CGA and close CGA assist Goal status: Progressing and will keep goal active to ensure consistency.     ASSESSMENT :  CLINICAL IMPRESSION: Patient presents with some increased difficulty standing initially but improved on 2nd set with only CGA- VC to lean forward to help propel him forward. Patient also started off slower with gait today but was able to improve step length with practice. He was overall more fatigued today but denied any pain or injury from recent fall.  Pt will continue  to benefit from skilled physical therapy intervention to address impairments, improve QOL, and attain therapy goals.    OBJECTIVE IMPAIRMENTS Abnormal gait, decreased activity tolerance,  decreased balance, decreased coordination, decreased endurance, decreased mobility, difficulty walking, decreased ROM, decreased strength, improper body mechanics, postural dysfunction, and obesity.   ACTIVITY LIMITATIONS carrying, lifting, bending, sitting, standing, squatting, stairs, transfers, bed mobility, continence, bathing, toileting, dressing, hygiene/grooming, and locomotion level  PARTICIPATION LIMITATIONS: meal prep, cleaning, laundry, interpersonal relationship, driving, shopping, community activity, and yard work  PERSONAL FACTORS Fitness, Past/current experiences, Time since onset of injury/illness/exacerbation, and 3+ comorbidities: hx of brain tumor, past CVA, HTN, obesity, learning disability, DMII  are also affecting patient's functional outcome.   REHAB POTENTIAL: Fair    CLINICAL DECISION MAKING: Stable/uncomplicated  EVALUATION COMPLEXITY: Low  PLAN: PT FREQUENCY: 1-2x/week  PT DURATION: 12 weeks  PLANNED INTERVENTIONS: Therapeutic exercises, Therapeutic activity, Neuromuscular re-education, Balance training, Gait training, Patient/Family education, Joint mobilization, Stair training, Vestibular training, Orthotic/Fit training, DME instructions, Dry Needling, Electrical stimulation, Wheelchair mobility training, Cryotherapy, Moist heat, Compression bandaging, Manual therapy, and Re-evaluation  PLAN FOR NEXT SESSION:  As much as possible / is tolerated have patient stand with good posture during his rest breaks. continue with strengthening exercises of the LE's, Functional ambulation when appropriate.  Louis Meckel, PT Physical Therapist- [place]  [facility]

## 2023-05-08 ENCOUNTER — Ambulatory Visit: Payer: 59

## 2023-05-08 DIAGNOSIS — R262 Difficulty in walking, not elsewhere classified: Secondary | ICD-10-CM

## 2023-05-08 DIAGNOSIS — M6281 Muscle weakness (generalized): Secondary | ICD-10-CM

## 2023-05-08 DIAGNOSIS — R2681 Unsteadiness on feet: Secondary | ICD-10-CM

## 2023-05-08 DIAGNOSIS — R278 Other lack of coordination: Secondary | ICD-10-CM

## 2023-05-08 DIAGNOSIS — R2689 Other abnormalities of gait and mobility: Secondary | ICD-10-CM

## 2023-05-08 DIAGNOSIS — R296 Repeated falls: Secondary | ICD-10-CM

## 2023-05-08 DIAGNOSIS — R269 Unspecified abnormalities of gait and mobility: Secondary | ICD-10-CM

## 2023-05-08 NOTE — Therapy (Signed)
OUTPATIENT PHYSICAL THERAPY NEURO TREATMENT   Patient Name: Curtis Powell MRN: [id number] DOB:[birth year], 58 y.o., male Today's Date: 05/08/2023  PCP: Curtis Baas, MD  REFERRING PROVIDER: Enid Baas, MD    PT End of Session - 05/08/23 1314     Visit Number 55    Number of Visits 67    Date for PT Re-Evaluation 06/12/23    Progress Note Due on Visit 60    PT Start Time 1310    Equipment Utilized During Treatment Gait belt    Activity Tolerance Patient tolerated treatment well;No increased pain    Behavior During Therapy WFL for tasks assessed/performed               Past Medical History:  Diagnosis Date   Asthma    GERD (gastroesophageal reflux disease)    Hyperlipidemia    Hypertension    Obesity    Stroke [facility])    Past Surgical History:  Procedure Laterality Date   BRAIN SURGERY     Patient Active Problem List   Diagnosis Date Noted   Impaired glucose tolerance 03/23/2020   Learning disability 03/23/2020   Urinary and fecal incontinence 06/14/2019   Chronic pain syndrome 03/23/2019   Constipation 03/23/2019   Dysarthria 03/23/2019   Dysphagia 03/23/2019   Learning difficulty 03/23/2019   Morbid obesity (HCC) 03/23/2019   Muscle weakness 03/23/2019   Vitamin D deficiency 03/23/2019   Bilateral carotid artery stenosis 12/15/2018   Moderate aortic valve stenosis 12/15/2018   BMI 45.0-49.9, adult (HCC) 11/10/2018   Cerebrovascular accident (HCC) 05/02/2016   Hemiparesis affecting right side as late effect of cerebrovascular accident (HCC) 05/01/2016   Brain tumor (HCC) 11/24/2014   Gastro-esophageal reflux disease without esophagitis 11/24/2014   Accident due to mechanical fall without injury 08/03/2014   Essential hypertension 06/13/2014   Mixed hyperlipidemia 06/13/2014   Unspecified sequelae of cerebral infarction 06/13/2014   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/2014   Type 2 diabetes mellitus without complication (HCC) 01/06/2014   Neck mass  09/08/2013   Swimmer's ear 09/08/2013   Erectile dysfunction 01/03/2013   Prediabetes 01/03/2013   ONSET DATE: 05/09/2016  REFERRING DIAG: W09.811 (ICD-10-CM) - Hemiplegia and hemiparesis following cerebral infarction affecting right dominant side   THERAPY DIAG:  Unsteadiness on feet  Difficulty in walking, not elsewhere classified  Other abnormalities of gait and mobility  Abnormality of gait and mobility  Muscle weakness (generalized)  Other lack of coordination  Repeated falls  Rationale for Evaluation and Treatment Rehabilitation  SUBJECTIVE:  SUBJECTIVE STATEMENT  Patient reports doing better with no more falls this week. States he has been standing at bathroom sink working on his standing time.   Pt accompanied by: self  PERTINENT HISTORY:   The pt is a pleasant 59 yo male who returns to PT for evaluation of deficits s/o old CVA. Pt with hx of CVA Feb 2018. He was previously fully independent, but CVA caused significant deficits, mainly affecting his right side. Pt reports continued difficulty with walking and transfers. Pt still using WC for majority of mobility, otherwise he uses his RW. Pt lives with his mom who is his main caregiver. Patient had outpatient PT previously in clinic in 2023. He reports since discharge he has been completing his HEP and walking about every day. Pt would like to improve his walking mainly, but also would like to work on balance. He also reports difficulty with transfers. Pt's mom and his caregiver helps him with ADLs. However, he says he is now waiting for a new caregiver. Prior to this pt had caregiver 3x/week. PMH significant for hx of brain tumor, past CVA, DMII and HTN. See chart for additional information.  PAIN:  Are you having pain? Pt reports no  pain currently  PRECAUTIONS: fall  WEIGHT BEARING RESTRICTIONS none  PLOF: required assistance with ADLs (old CVA), prior to CVA in 2018 was indep.  History of Falls: Pt reports no recent falls  Home information: Pt lives with mother who is his primary caregiver. Previously had home aide, 3x/week but reports currently waiting for new one.  PATIENT GOALS: Pt would like to improve his ability to ambulate.  OBJECTIVE:  taken at [facility] unless otherwise noted:   DIAGNOSTIC FINDINGS: No recent imaging per chart  Objective measures taken at eval unless otherwise specified:   Posture: forward head posture, rounded shoulders  MMT LE: grossly  4+/5, exception hip flexors and abductors 4/5 each bilat   Functional tests:  5xSTS:  34 sec, use of BUE (multiple initial attempts with posterior LOB in chair)   : 0.096 m/s with RW, close CGA, and WC follow  Gait for distance (goal 150 ft): deferred due to time  Gait mechanics: impaired, decreased speed (see ), crouched posture, poor postural stability and relies heavily on BUE support on RW. Pt now with bilat AFOS   FOTO: 44 (goal 50)    TODAY'S TREATMENT  05/08/23   Therex:    Standing lumbar flex into ext- focusing on erect posture- 2 sets x 10  with BUE on // bars.  Standing thoracic rotation- (Right hand tap onto top of left rail then left hand onto right rail) 2 sets x 10 reps   Therapeutic Activities:   Static standing at // bars without UE support - Focusing on LE weight bearing and no UE support  1) 2 min 50 sec- only CGA 2) 50 -  more posterior lean requiring some Min Assist 3) 2 min 43 sec - Only SBA to CGA    NMR:   Gait training in // bars-focusing on step length and Posture- Attempting to stand more erect  and using mirror for feedback- down and back x 2 prior to rest. Then repeated x 2 more times- 13-14 steps counting forward and 17-18 steps walking backward- focusing  on standing as erect as  possible.       Therapeutic activities:   Sit to stand with 1 UE on rail of // bars and 1UE pushing up from armrest -  CGA  x 2 sets x 10 reps - Minimal VC to perform correctly initially including ensuring to lean forward.         PATIENT EDUCATION: Education details: Pt educated throughout session about proper posture and technique with exercises. Improved exercise technique, movement at target joints, use of target muscles after min to mod verbal, visual, tactile cues.   Person educated: Patient Education method: Explanation, Demonstration, Tactile cues, and Verbal cues Education comprehension: verbalized understanding, returned demonstration, and verbal cues required  HOME EXERCISE PROGRAM:  Standing hip flexor stretch at counter, prolonged standing with erect posture for gluteal activation, HS curls with RTB   GOALS: Goals reviewed with patient? No  SHORT TERM GOALS: Target date: 11/12/2022  Pt will be independent with HEP in order to improve strength and balance in order to decrease fall risk and improve function at home. Baseline: instructed in HEP today, to provide printout next visit Goal status: MET    LONG TERM GOALS: Target date: 06/12/2023  Pt will increase FOTO to at least 50 in order to demonstrate improvement in function related to mobility and QoL. Baseline: 44 7/29:48 9/10:40 12/24/2022= 51 Goal status: MET  2.  Pt (< 60 yrs old) will complete 5xSTS test < 30 seconds indicating an increase in LE strength and improved balance. Baseline: 10/01/22 34 sec (multiple initial attempts with posterior LOB in chair) 12/20: 49 seconds 11/04/22:1:06, min A on multiple attempts to maintain balance and pt stays in flexed posture throughout test when coming to standing.   9/10: 27.5 sec, Pushes WC post consistently, close CGA  10/22: 47 seconds first attempt 12/5: 1 minute, 37 seconds, multiple attempts to get first stand, but no difficulty after that; 03/20/2023= 1 min 2  sec with 1UE on walker and 1 on armrest- some posterior lean- Close CGA Goal status: ONGOING  3.  Pt will increase to > 0.5 m/s (30 sec) as to improve gait speed for better home ambulation and reduce fall risk. Baseline: 10/01/22 0.096 m/s with RW, close CGA, and WC follow previously 12/27: 0.11 m/s 11/04/22: .056 m/s  9/10: 076 m/s 10/22: 0.64 m/s  11/26: .076 m/s; 03/20/2023= 0.08 m/s; 04/22/2023= 0.089 m/s Goal status: PROGRESSING  4.  Pt will increase hip flexor and abductor  gross strength to 4+/5 bilat as to improve functional strength for independent gait, increased standing tolerance, and increased ADL ability. Baseline: 10/01/22 4+/5, exception hip flexors and abductors 4/5 each bilat  (previously 03/27/22: 4+/5 ) 9/10: Hip flexor 4/5, hip abductor 4+/5 10/22: hip flexor 4/5 hip abductor 4+/5 11/26: hip flexor 4+/5 hip abductor 4+/5 Goal status: MET  5.  Pt will ambulate 150 ft with BRW and WC follow with no rest breaks in less than 12 min to allow for improved mobility within home and increased independence. Baseline: 10/03/22 150' with BRW 04/03/22: 105 ft with BRW 7/29: 100 in 9:30 with pediatric RW 9/10:101 ft with BRW stooped posture worsened throughout test 12/24/2022- 150 feet using BRW, CGA with w/c follow- in 10 min 17 sec. Will keep goal to make sure he can do this consistently 10/22: 119 ft with BRW 8 min 23 seconds terminated by patient  11/26: 148 ft with BRW 14 min 34 seconds terminated by patient 03/20/2023= 152 feet using BRW, CGA and w/c follow in 14 min 3 sec- Need to keep goal active to ensure consistency but will revised and add a time element to goal. 04/22/2023= 162 feet in 11 min 54 sec using  bariatric RW, CGA and close CGA assist Goal status: Progressing and will keep goal active to ensure consistency.     ASSESSMENT :  CLINICAL IMPRESSION: Treatment focused heavily today on posture (standing as erect as you can using mirror throughout session for  feedback). Patient presents with more posterior lean Patient presents with some increased difficulty standing initially but improved on 2nd set with only CGA- VC to lean forward to help propel him forward. He is progressing with overall standing ability and less overall VC for posture. Pt will continue to benefit from skilled physical therapy intervention to address impairments, improve QOL, and attain therapy goals.    OBJECTIVE IMPAIRMENTS Abnormal gait, decreased activity tolerance, decreased balance, decreased coordination, decreased endurance, decreased mobility, difficulty walking, decreased ROM, decreased strength, improper body mechanics, postural dysfunction, and obesity.   ACTIVITY LIMITATIONS carrying, lifting, bending, sitting, standing, squatting, stairs, transfers, bed mobility, continence, bathing, toileting, dressing, hygiene/grooming, and locomotion level  PARTICIPATION LIMITATIONS: meal prep, cleaning, laundry, interpersonal relationship, driving, shopping, community activity, and yard work  PERSONAL FACTORS Fitness, Past/current experiences, Time since onset of injury/illness/exacerbation, and 3+ comorbidities: hx of brain tumor, past CVA, HTN, obesity, learning disability, DMII  are also affecting patient's functional outcome.   REHAB POTENTIAL: Fair    CLINICAL DECISION MAKING: Stable/uncomplicated  EVALUATION COMPLEXITY: Low  PLAN: PT FREQUENCY: 1-2x/week  PT DURATION: 12 weeks  PLANNED INTERVENTIONS: Therapeutic exercises, Therapeutic activity, Neuromuscular re-education, Balance training, Gait training, Patient/Family education, Joint mobilization, Stair training, Vestibular training, Orthotic/Fit training, DME instructions, Dry Needling, Electrical stimulation, Wheelchair mobility training, Cryotherapy, Moist heat, Compression bandaging, Manual therapy, and Re-evaluation  PLAN FOR NEXT SESSION:  As much as possible / is tolerated have patient stand with good posture  during his rest breaks. continue with strengthening exercises of the LE's, Functional ambulation when appropriate.  Louis Meckel, PT Physical Therapist- [place]  [facility]

## 2023-05-13 ENCOUNTER — Ambulatory Visit: Payer: 59 | Attending: Internal Medicine

## 2023-05-13 DIAGNOSIS — R296 Repeated falls: Secondary | ICD-10-CM | POA: Insufficient documentation

## 2023-05-13 DIAGNOSIS — R262 Difficulty in walking, not elsewhere classified: Secondary | ICD-10-CM

## 2023-05-13 DIAGNOSIS — R531 Weakness: Secondary | ICD-10-CM | POA: Insufficient documentation

## 2023-05-13 DIAGNOSIS — R278 Other lack of coordination: Secondary | ICD-10-CM | POA: Diagnosis present

## 2023-05-13 DIAGNOSIS — R269 Unspecified abnormalities of gait and mobility: Secondary | ICD-10-CM | POA: Diagnosis present

## 2023-05-13 DIAGNOSIS — R2681 Unsteadiness on feet: Secondary | ICD-10-CM

## 2023-05-13 DIAGNOSIS — I69351 Hemiplegia and hemiparesis following cerebral infarction affecting right dominant side: Secondary | ICD-10-CM | POA: Insufficient documentation

## 2023-05-13 DIAGNOSIS — M6281 Muscle weakness (generalized): Secondary | ICD-10-CM

## 2023-05-13 DIAGNOSIS — R2689 Other abnormalities of gait and mobility: Secondary | ICD-10-CM | POA: Diagnosis present

## 2023-05-13 NOTE — Therapy (Signed)
 OUTPATIENT PHYSICAL THERAPY NEURO TREATMENT   Patient Name: Curtis Powell MRN: [id number] DOB:[birth year], 58 y.o., male Today's Date: 05/13/2023  PCP: Sherial Bail, MD  REFERRING PROVIDER: Sherial Bail, MD    PT End of Session - 05/13/23 1319     Visit Number 56    Number of Visits 67    Date for PT Re-Evaluation 06/12/23    PT Start Time 1315    PT Stop Time 1353    PT Time Calculation (min) 38 min    Equipment Utilized During Treatment Gait belt    Activity Tolerance Patient tolerated treatment well;No increased pain    Behavior During Therapy WFL for tasks assessed/performed               Past Medical History:  Diagnosis Date   Asthma    GERD (gastroesophageal reflux disease)    Hyperlipidemia    Hypertension    Obesity    Stroke [facility])    Past Surgical History:  Procedure Laterality Date   BRAIN SURGERY     Patient Active Problem List   Diagnosis Date Noted   Impaired glucose tolerance 03/23/2020   Learning disability 03/23/2020   Urinary and fecal incontinence 06/14/2019   Chronic pain syndrome 03/23/2019   Constipation 03/23/2019   Dysarthria 03/23/2019   Dysphagia 03/23/2019   Learning difficulty 03/23/2019   Morbid obesity (HCC) 03/23/2019   Muscle weakness 03/23/2019   Vitamin D deficiency 03/23/2019   Bilateral carotid artery stenosis 12/15/2018   Moderate aortic valve stenosis 12/15/2018   BMI 45.0-49.9, adult (HCC) 11/10/2018   Cerebrovascular accident (HCC) 05/02/2016   Hemiparesis affecting right side as late effect of cerebrovascular accident (HCC) 05/01/2016   Brain tumor (HCC) 11/24/2014   Gastro-esophageal reflux disease without esophagitis 11/24/2014   Accident due to mechanical fall without injury 08/03/2014   Essential hypertension 06/13/2014   Mixed hyperlipidemia 06/13/2014   Unspecified sequelae of cerebral infarction 06/13/2014   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/2014   Type 2 diabetes mellitus without complication (HCC)  01/06/2014   Neck mass 09/08/2013   Swimmer's ear 09/08/2013   Erectile dysfunction 01/03/2013   Prediabetes 01/03/2013   ONSET DATE: 05/09/2016  REFERRING DIAG: P30.648 (ICD-10-CM) - Hemiplegia and hemiparesis following cerebral infarction affecting right dominant side   THERAPY DIAG:  Unsteadiness on feet  Difficulty in walking, not elsewhere classified  Other abnormalities of gait and mobility  Abnormality of gait and mobility  Muscle weakness (generalized)  Rationale for Evaluation and Treatment Rehabilitation  SUBJECTIVE:  SUBJECTIVE STATEMENT  Patient reports doing better with no more falls this week. States he has been standing at bathroom sink working on his standing time. Pt been wlaking at [employer].   Pt accompanied by: self  PERTINENT HISTORY:   The pt is a pleasant 58 yo male who returns to PT for evaluation of deficits s/o old CVA. Pt with hx of CVA Feb 2018. He was previously fully independent, but CVA caused significant deficits, mainly affecting his right side. Pt reports continued difficulty with walking and transfers. Pt still using WC for majority of mobility, otherwise he uses his RW. Pt lives with his mom who is his main caregiver. Patient had outpatient PT previously in clinic in 2023. He reports since discharge he has been completing his HEP and walking about every day. Pt would like to improve his walking mainly, but also would like to work on balance. He also reports difficulty with transfers. Pt's mom and his caregiver helps him with ADLs. However, he says he is now waiting for a new caregiver. Prior to this pt had caregiver 3x/week. PMH significant for hx of brain tumor, past CVA, DMII and HTN. See chart for additional information.  PAIN:  Are you having  pain? Pt reports no pain currently  PRECAUTIONS: fall  WEIGHT BEARING RESTRICTIONS none  PLOF: required assistance with ADLs (old CVA), prior to CVA in 2018 was indep.  History of Falls: Pt reports no recent falls  Home information: Pt lives with mother who is his primary caregiver. Previously had home aide, 3x/week but reports currently waiting for new one.  PATIENT GOALS: Pt would like to improve his ability to ambulate.  OBJECTIVE:  taken at [facility] unless otherwise noted:   DIAGNOSTIC FINDINGS: No recent imaging per chart  Objective measures taken at eval unless otherwise specified:   Posture: forward head posture, rounded shoulders  MMT LE: grossly  4+/5, exception hip flexors and abductors 4/5 each bilat   Functional tests:  5xSTS:  34 sec, use of BUE (multiple initial attempts with posterior LOB in chair)   : 0.096 m/s with RW, close CGA, and WC follow  Gait for distance (goal 150 ft): deferred due to time  Gait mechanics: impaired, decreased speed (see ), crouched posture, poor postural stability and relies heavily on BUE support on RW. Pt now with bilat AFOS   FOTO: 44 (goal 50)    TODAY'S TREATMENT  05/13/23   -STS from [facility] with standard walker x2, intermittent mod-maxA for balance re covery  -seated RDL (bilat hand digit 3 tips to yoga block) x10  -STS from WC with SW, minguard Assist x8, requires 13 attempts, modA for balance 2x)  -seated RDL (bilat hand digit 3 tips to yoga block) x10  *seated recovery  -STS from WC to RW, AMB to fatigue: 107ft step 2 gait, 1 LOB requiring assist form author, otherwise minguard Assist with WC follow.  *seated recovery  -static stance one hand support with 10 item task at anterior table 0-2lb weight  *seated recovery  -static stance one hand support on RW, sorting a dekc of playing card by suit (only got through ~16 before end of session)   PATIENT EDUCATION: Education details: Pt educated throughout session  about proper posture and technique with exercises. Improved exercise technique, movement at target joints, use of target muscles after min to mod verbal, visual, tactile cues.   Person educated: Patient Education method: Explanation, Demonstration, Tactile cues, and Verbal cues Education comprehension: verbalized  understanding, returned demonstration, and verbal cues required  HOME EXERCISE PROGRAM:  Standing hip flexor stretch at counter, prolonged standing with erect posture for gluteal activation, HS curls with RTB   GOALS: Goals reviewed with patient? No  SHORT TERM GOALS: Target date: 11/12/2022  Pt will be independent with HEP in order to improve strength and balance in order to decrease fall risk and improve function at home. Baseline: instructed in HEP today, to provide printout next visit Goal status: MET    LONG TERM GOALS: Target date: 06/12/2023  Pt will increase FOTO to at least 50 in order to demonstrate improvement in function related to mobility and QoL. Baseline: 44 7/29:48 9/10:40 12/24/2022= 51 Goal status: MET  2.  Pt (< 60 yrs old) will complete 5xSTS test < 30 seconds indicating an increase in LE strength and improved balance. Baseline: 10/01/22 34 sec (multiple initial attempts with posterior LOB in chair) 12/20: 49 seconds 11/04/22:1:06, min A on multiple attempts to maintain balance and pt stays in flexed posture throughout test when coming to standing.   9/10: 27.5 sec, Pushes WC post consistently, close CGA  10/22: 47 seconds first attempt 12/5: 1 minute, 37 seconds, multiple attempts to get first stand, but no difficulty after that; 03/20/2023= 1 min 2 sec with 1UE on walker and 1 on armrest- some posterior lean- Close CGA Goal status: ONGOING  3.  Pt will increase to > 0.5 m/s (30 sec) as to improve gait speed for better home ambulation and reduce fall risk. Baseline: 10/01/22 0.096 m/s with RW, close CGA, and WC follow previously 12/27: 0.11 m/s  11/04/22: .056 m/s  9/10: 076 m/s 10/22: 0.64 m/s  11/26: .076 m/s; 03/20/2023= 0.08 m/s; 04/22/2023= 0.089 m/s Goal status: PROGRESSING  4.  Pt will increase hip flexor and abductor  gross strength to 4+/5 bilat as to improve functional strength for independent gait, increased standing tolerance, and increased ADL ability. Baseline: 10/01/22 4+/5, exception hip flexors and abductors 4/5 each bilat  (previously 03/27/22: 4+/5 ) 9/10: Hip flexor 4/5, hip abductor 4+/5 10/22: hip flexor 4/5 hip abductor 4+/5 11/26: hip flexor 4+/5 hip abductor 4+/5 Goal status: MET  5.  Pt will ambulate 150 ft with BRW and WC follow with no rest breaks in less than 12 min to allow for improved mobility within home and increased independence. Baseline: 10/03/22 150' with BRW 04/03/22: 105 ft with BRW 7/29: 100 in 9:30 with pediatric RW 9/10:101 ft with BRW stooped posture worsened throughout test 12/24/2022- 150 feet using BRW, CGA with w/c follow- in 10 min 17 sec. Will keep goal to make sure he can do this consistently 10/22: 119 ft with BRW 8 min 23 seconds terminated by patient  11/26: 148 ft with BRW 14 min 34 seconds terminated by patient 03/20/2023= 152 feet using BRW, CGA and w/c follow in 14 min 3 sec- Need to keep goal active to ensure consistency but will revised and add a time element to goal. 04/22/2023= 162 feet in 11 min 54 sec using bariatric RW, CGA and close CGA assist Goal status: Progressing and will keep goal active to ensure consistency.     ASSESSMENT :  CLINICAL IMPRESSION: Continued block practice of transfers, static stance and initiation of gait. Postural control remains very limited, requires heavy UE assist and multiple attempts for success. Pt remains motivated.Pt will continue to benefit from skilled physical therapy intervention to address impairments, improve QOL, and attain therapy goals.    OBJECTIVE IMPAIRMENTS Abnormal  gait, decreased activity tolerance, decreased balance,  decreased coordination, decreased endurance, decreased mobility, difficulty walking, decreased ROM, decreased strength, improper body mechanics, postural dysfunction, and obesity.   ACTIVITY LIMITATIONS carrying, lifting, bending, sitting, standing, squatting, stairs, transfers, bed mobility, continence, bathing, toileting, dressing, hygiene/grooming, and locomotion level  PARTICIPATION LIMITATIONS: meal prep, cleaning, laundry, interpersonal relationship, driving, shopping, community activity, and yard work  PERSONAL FACTORS Fitness, Past/current experiences, Time since onset of injury/illness/exacerbation, and 3+ comorbidities: hx of brain tumor, past CVA, HTN, obesity, learning disability, DMII  are also affecting patient's functional outcome.   REHAB POTENTIAL: Fair    CLINICAL DECISION MAKING: Stable/uncomplicated  EVALUATION COMPLEXITY: Low  PLAN: PT FREQUENCY: 1-2x/week  PT DURATION: 12 weeks  PLANNED INTERVENTIONS: Therapeutic exercises, Therapeutic activity, Neuromuscular re-education, Balance training, Gait training, Patient/Family education, Joint mobilization, Stair training, Vestibular training, Orthotic/Fit training, DME instructions, Dry Needling, Electrical stimulation, Wheelchair mobility training, Cryotherapy, Moist heat, Compression bandaging, Manual therapy, and Re-evaluation  PLAN FOR NEXT SESSION:  As much as possible / is tolerated have patient stand with good posture during his rest breaks. continue with strengthening exercises of the LE's, Functional ambulation when appropriate.  1:27 PM, 05/13/23 Peggye JAYSON Linear, PT, DPT Physical Therapist - [facility]  Outpatient Physical Therapy- Main [facility] [phone]

## 2023-05-15 ENCOUNTER — Ambulatory Visit: Payer: 59 | Admitting: Physical Therapy

## 2023-05-15 DIAGNOSIS — R269 Unspecified abnormalities of gait and mobility: Secondary | ICD-10-CM

## 2023-05-15 DIAGNOSIS — I69351 Hemiplegia and hemiparesis following cerebral infarction affecting right dominant side: Secondary | ICD-10-CM

## 2023-05-15 DIAGNOSIS — R2681 Unsteadiness on feet: Secondary | ICD-10-CM

## 2023-05-15 DIAGNOSIS — R2689 Other abnormalities of gait and mobility: Secondary | ICD-10-CM

## 2023-05-15 DIAGNOSIS — R531 Weakness: Secondary | ICD-10-CM

## 2023-05-15 DIAGNOSIS — R262 Difficulty in walking, not elsewhere classified: Secondary | ICD-10-CM

## 2023-05-15 DIAGNOSIS — R278 Other lack of coordination: Secondary | ICD-10-CM

## 2023-05-15 DIAGNOSIS — M6281 Muscle weakness (generalized): Secondary | ICD-10-CM

## 2023-05-15 NOTE — Therapy (Signed)
 OUTPATIENT PHYSICAL THERAPY NEURO TREATMENT   Patient Name: Curtis Powell MRN: [id number] DOB:1966/02/23, 58 y.o., male Today's Date: 05/15/2023  PCP: Sherial Bail, MD  REFERRING PROVIDER: Sherial Bail, MD    PT End of Session - 05/15/23 1314     Visit Number 57    Number of Visits 67    Date for PT Re-Evaluation 06/12/23    PT Start Time 1315    PT Stop Time 1400    PT Time Calculation (min) 45 min    Equipment Utilized During Treatment Gait belt    Activity Tolerance Patient tolerated treatment well;No increased pain    Behavior During Therapy WFL for tasks assessed/performed                Past Medical History:  Diagnosis Date   Asthma    GERD (gastroesophageal reflux disease)    Hyperlipidemia    Hypertension    Obesity    Stroke [facility])    Past Surgical History:  Procedure Laterality Date   BRAIN SURGERY     Patient Active Problem List   Diagnosis Date Noted   Impaired glucose tolerance 03/23/2020   Learning disability 03/23/2020   Urinary and fecal incontinence 06/14/2019   Chronic pain syndrome 03/23/2019   Constipation 03/23/2019   Dysarthria 03/23/2019   Dysphagia 03/23/2019   Learning difficulty 03/23/2019   Morbid obesity (HCC) 03/23/2019   Muscle weakness 03/23/2019   Vitamin D deficiency 03/23/2019   Bilateral carotid artery stenosis 12/15/2018   Moderate aortic valve stenosis 12/15/2018   BMI 45.0-49.9, adult (HCC) 11/10/2018   Cerebrovascular accident (HCC) 05/02/2016   Hemiparesis affecting right side as late effect of cerebrovascular accident (HCC) 05/01/2016   Brain tumor (HCC) 11/24/2014   Gastro-esophageal reflux disease without esophagitis 11/24/2014   Accident due to mechanical fall without injury 08/03/2014   Essential hypertension 06/13/2014   Mixed hyperlipidemia 06/13/2014   Unspecified sequelae of cerebral infarction 06/13/2014   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/2014   Type 2 diabetes mellitus without complication (HCC)  01/06/2014   Neck mass 09/08/2013   Swimmer's ear 09/08/2013   Erectile dysfunction 01/03/2013   Prediabetes 01/03/2013   ONSET DATE: 05/09/2016  REFERRING DIAG: P30.648 (ICD-10-CM) - Hemiplegia and hemiparesis following cerebral infarction affecting right dominant side   THERAPY DIAG:  Unsteadiness on feet  Difficulty in walking, not elsewhere classified  Other abnormalities of gait and mobility  Abnormality of gait and mobility  Muscle weakness (generalized)  Other lack of coordination  Left-sided weakness  Hemiplegia and hemiparesis following cerebral infarction affecting right dominant side (HCC)   Rationale for Evaluation and Treatment Rehabilitation  SUBJECTIVE:  SUBJECTIVE STATEMENT  Patient reports his R LE is painful today stating it feels like a throbbing that comes and goes. Not currently experiencing this pain and no additional complaints of pain during session.  Requests therapist assess BP (see below). Reports not walking today prior to arrival, but states he is doing the seated marches and standing at sink counting to 100 ever day as part of his HEP.   Pt accompanied by: self   PERTINENT HISTORY:   The pt is a pleasant 58 yo male who returns to PT for evaluation of deficits s/o old CVA. Pt with hx of CVA Feb 2018. He was previously fully independent, but CVA caused significant deficits, mainly affecting his right side. Pt reports continued difficulty with walking and transfers. Pt still using WC for majority of mobility, otherwise he uses his RW. Pt lives with his mom who is his main caregiver. Patient had outpatient PT previously in clinic in 2023. He reports since discharge he has been completing his HEP and walking about every day. Pt would like to improve his walking  mainly, but also would like to work on balance. He also reports difficulty with transfers. Pt's mom and his caregiver helps him with ADLs. However, he says he is now waiting for a new caregiver. Prior to this pt had caregiver 3x/week. PMH significant for hx of brain tumor, past CVA, DMII and HTN. See chart for additional information.  PAIN:  Are you having pain? Pt reports no pain currently  PRECAUTIONS: fall  WEIGHT BEARING RESTRICTIONS none  PLOF: required assistance with ADLs (old CVA), prior to CVA in 2018 was indep.  History of Falls: Pt reports no recent falls  Home information: Pt lives with mother who is his primary caregiver. Previously had home aide, 3x/week but reports currently waiting for new one.  PATIENT GOALS: Pt would like to improve his ability to ambulate.  OBJECTIVE:  taken at [facility] unless otherwise noted:   DIAGNOSTIC FINDINGS: No recent imaging per chart  Objective measures taken at eval unless otherwise specified:   Posture: forward head posture, rounded shoulders  MMT LE: grossly  4+/5, exception hip flexors and abductors 4/5 each bilat   Functional tests:  5xSTS:  34 sec, use of BUE (multiple initial attempts with posterior LOB in chair)   : 0.096 m/s with RW, close CGA, and WC follow  Gait for distance (goal 150 ft): deferred due to time  Gait mechanics: impaired, decreased speed (see ), crouched posture, poor postural stability and relies heavily on BUE support on RW. Pt now with bilat AFOS   FOTO: 44 (goal 50)    TODAY'S TREATMENT 05/15/23   Vitals at beginning of session in sitting: BP 135/69 (MAP 86), HR 75bpm   Wearing B LE custom AFOs  - L stand pivot from his personal w/c to chair using heavy duty RW with min A and requiring 2 attempts to come to stand due to posterior LOB from lack of adequate anterior weight shift - Repeated STSs from chair to heavy duty RW x8 reps with min A for balance and continued cuing for bringing feet  back underneath BOS, anterior trunk lean/weight shift, and then transitioning to hip extension with trunk extension  Gait training 117ft using heavy duty RW with min assist for balance and wheelchair follow in event of fatigue  - toe drag bilaterally, short step lengths (only partially reciprocal), excessive forward trunk flexed posture, heavy reliance on B UE support on AD, unable to maintain  continuous forward movement of AD (has to pause with each step) - cuing for improved trunk/hip extension for upright posture and increased weight shift onto stance limbs to improve contralateral swing advancement.  10 Meter Walk Test: Patient instructed to walk 10 meters (32.8 ft) as quickly and as safely as possible at their normal speed.  Result: 0.079 m/s using heavy duty RW Cut off scores: <0.4 m/s = household Ambulator, 0.4-0.8 m/s = limited community Ambulator, >0.8 m/s = community Ambulator, >1.2 m/s = crossing a street, <1.0 = increased fall risk MCID 0.05 m/s (small), 0.13 m/s (moderate), 0.06 m/s (significant)  (ANPTA Core Set of Outcome Measures for Adults with Neurologic Conditions, 2018)  Dynamic standing balance and B LE NMR training via alternating foot taps to 2 height step using B UE support on heavy duty RW - min A for balance and AD stability - cuing for increased hip/knee flexion to improve swing phase of gait.  Discussed HEP of standing at counter and pt reports he consistently does it 1x/day for 100seconds, but changed recommendation to standing more frequently (every 2hrs), even if for a shorter amount of time such as 10-20 seconds rather than 100 seconds. The change in his goal is to ensure proper pressure relief as well as decrease risk of hip flexion contractures from prolonged sitting.  Added long arc quads to pt's HEP - provided hand-out - pt demonstrates understanding.   PATIENT EDUCATION: Education details: Pt educated throughout session about proper posture and technique with  exercises. Improved exercise technique, movement at target joints, use of target muscles after min to mod verbal, visual, tactile cues.  Person educated: Patient Education method: Explanation, Demonstration, Tactile cues, and Verbal cues Education comprehension: verbalized understanding, returned demonstration, and verbal cues required  HOME EXERCISE PROGRAM:  Standing hip flexor stretch at counter, prolonged standing with erect posture for gluteal activation, HS curls with RTB   Added the below HEP on 05/15/23  Access Code: [email or web address] URL: [email or web address] Date: 05/15/2023 Prepared by: Connell Kiss  Exercises - Seated Long Arc Quad with Ankle Weight  - 1 x daily - 7 x weekly - 2 sets - 10 reps   GOALS: Goals reviewed with patient? No  SHORT TERM GOALS: Target date: 11/12/2022  Pt will be independent with HEP in order to improve strength and balance in order to decrease fall risk and improve function at home. Baseline: instructed in HEP today, to provide printout next visit Goal status: MET    LONG TERM GOALS: Target date: 06/12/2023  Pt will increase FOTO to at least 50 in order to demonstrate improvement in function related to mobility and QoL. Baseline: 44 7/29:48 9/10:40 12/24/2022= 51 Goal status: MET  2.  Pt (< 60 yrs old) will complete 5xSTS test < 30 seconds indicating an increase in LE strength and improved balance. Baseline: 10/01/22 34 sec (multiple initial attempts with posterior LOB in chair) 12/20: 49 seconds 11/04/22:1:06, min A on multiple attempts to maintain balance and pt stays in flexed posture throughout test when coming to standing.   9/10: 27.5 sec, Pushes WC post consistently, close CGA  10/22: 47 seconds first attempt 12/5: 1 minute, 37 seconds, multiple attempts to get first stand, but no difficulty after that; 03/20/2023= 1 min 2 sec with 1UE on walker and 1 on armrest- some posterior lean- Close CGA Goal status: ONGOING  3.  Pt will  increase to > 0.5 m/s (30 sec) as to improve gait speed for better home ambulation and  reduce fall risk. Baseline: 10/01/22 0.096 m/s with RW, close CGA, and WC follow previously 12/27: 0.11 m/s 11/04/22: .056 m/s  9/10: 076 m/s 10/22: 0.64 m/s  11/26: .076 m/s; 03/20/2023= 0.08 m/s; 04/22/2023= 0.089 m/s; 05/15/2023 = 0.079 m/s Goal status: PROGRESSING  4.  Pt will increase hip flexor and abductor  gross strength to 4+/5 bilat as to improve functional strength for independent gait, increased standing tolerance, and increased ADL ability. Baseline: 10/01/22 4+/5, exception hip flexors and abductors 4/5 each bilat  (previously 03/27/22: 4+/5 ) 9/10: Hip flexor 4/5, hip abductor 4+/5 10/22: hip flexor 4/5 hip abductor 4+/5 11/26: hip flexor 4+/5 hip abductor 4+/5 Goal status: MET  5.  Pt will ambulate 150 ft with BRW and WC follow with no rest breaks in less than 12 min to allow for improved mobility within home and increased independence. Baseline: 10/03/22 150' with BRW 04/03/22: 105 ft with BRW 7/29: 100 in 9:30 with pediatric RW 9/10:101 ft with BRW stooped posture worsened throughout test 12/24/2022- 150 feet using BRW, CGA with w/c follow- in 10 min 17 sec. Will keep goal to make sure he can do this consistently 10/22: 119 ft with BRW 8 min 23 seconds terminated by patient  11/26: 148 ft with BRW 14 min 34 seconds terminated by patient 03/20/2023= 152 feet using BRW, CGA and w/c follow in 14 min 3 sec- Need to keep goal active to ensure consistency but will revised and add a time element to goal. 04/22/2023= 162 feet in 11 min 54 sec using bariatric RW, CGA and close CGA assist Goal status: Progressing and will keep goal active to ensure consistency.     ASSESSMENT :  CLINICAL IMPRESSION: Patient continues to have significant B LE weakness, decreased ROM/flexibility in hips, knees, and ankles as well as overall decreased speed of movements. He continues to rely heavily on B UE support on  AD during standing and gait training activities due to significantly impaired balance. Pt remains highly motivated and will benefit from continued skilled physical therapy to further progress his strength and balance to decrease fall risk. Pt will continue to benefit from skilled physical therapy intervention to address impairments, improve QOL, and attain therapy goals.    OBJECTIVE IMPAIRMENTS Abnormal gait, decreased activity tolerance, decreased balance, decreased coordination, decreased endurance, decreased mobility, difficulty walking, decreased ROM, decreased strength, improper body mechanics, postural dysfunction, and obesity.   ACTIVITY LIMITATIONS carrying, lifting, bending, sitting, standing, squatting, stairs, transfers, bed mobility, continence, bathing, toileting, dressing, hygiene/grooming, and locomotion level  PARTICIPATION LIMITATIONS: meal prep, cleaning, laundry, interpersonal relationship, driving, shopping, community activity, and yard work  PERSONAL FACTORS Fitness, Past/current experiences, Time since onset of injury/illness/exacerbation, and 3+ comorbidities: hx of brain tumor, past CVA, HTN, obesity, learning disability, DMII  are also affecting patient's functional outcome.   REHAB POTENTIAL: Fair    CLINICAL DECISION MAKING: Stable/uncomplicated  EVALUATION COMPLEXITY: Low  PLAN: PT FREQUENCY: 1-2x/week  PT DURATION: 12 weeks  PLANNED INTERVENTIONS: Therapeutic exercises, Therapeutic activity, Neuromuscular re-education, Balance training, Gait training, Patient/Family education, Joint mobilization, Stair training, Vestibular training, Orthotic/Fit training, DME instructions, Dry Needling, Electrical stimulation, Wheelchair mobility training, Cryotherapy, Moist heat, Compression bandaging, Manual therapy, and Re-evaluation  PLAN FOR NEXT SESSION: Continue having patient stand targeting hip flexor stretch with good posture during rest breaks. Continue with functional  strengthening exercises of the LE's and gait training using RW. Work on lateral stepping and backwards stepping in // bars. Could utilize [employer] for higher intensity dynamic reaching  during standing balance.    Connell CHRISTELLA Kiss, PT, DPT, NCS, CSRS Physical Therapist - [facility]  4:37 PM 05/15/23

## 2023-05-20 ENCOUNTER — Ambulatory Visit: Payer: 59

## 2023-05-20 DIAGNOSIS — R278 Other lack of coordination: Secondary | ICD-10-CM

## 2023-05-20 DIAGNOSIS — R531 Weakness: Secondary | ICD-10-CM

## 2023-05-20 DIAGNOSIS — I69351 Hemiplegia and hemiparesis following cerebral infarction affecting right dominant side: Secondary | ICD-10-CM

## 2023-05-20 DIAGNOSIS — R269 Unspecified abnormalities of gait and mobility: Secondary | ICD-10-CM

## 2023-05-20 DIAGNOSIS — M6281 Muscle weakness (generalized): Secondary | ICD-10-CM

## 2023-05-20 DIAGNOSIS — R2681 Unsteadiness on feet: Secondary | ICD-10-CM | POA: Diagnosis not present

## 2023-05-20 DIAGNOSIS — R262 Difficulty in walking, not elsewhere classified: Secondary | ICD-10-CM

## 2023-05-20 DIAGNOSIS — R2689 Other abnormalities of gait and mobility: Secondary | ICD-10-CM

## 2023-05-20 NOTE — Therapy (Signed)
OUTPATIENT PHYSICAL THERAPY NEURO TREATMENT   Patient Name: Curtis Powell MRN: [id number] DOB:28-Jun-1965, 58 y.o., male Today's Date: 05/21/2023  PCP: Enid Baas, MD  REFERRING PROVIDER: Enid Baas, MD    PT End of Session - 05/20/23 1317     Visit Number 58    Number of Visits 67    Date for PT Re-Evaluation 06/12/23    Progress Note Due on Visit 60    PT Start Time 1317    PT Stop Time 1358    PT Time Calculation (min) 41 min    Equipment Utilized During Treatment Gait belt    Activity Tolerance Patient tolerated treatment well;No increased pain    Behavior During Therapy WFL for tasks assessed/performed                 Past Medical History:  Diagnosis Date   Asthma    GERD (gastroesophageal reflux disease)    Hyperlipidemia    Hypertension    Obesity    Stroke [facility])    Past Surgical History:  Procedure Laterality Date   BRAIN SURGERY     Patient Active Problem List   Diagnosis Date Noted   Impaired glucose tolerance 03/23/2020   Learning disability 03/23/2020   Urinary and fecal incontinence 06/14/2019   Chronic pain syndrome 03/23/2019   Constipation 03/23/2019   Dysarthria 03/23/2019   Dysphagia 03/23/2019   Learning difficulty 03/23/2019   Morbid obesity (HCC) 03/23/2019   Muscle weakness 03/23/2019   Vitamin D deficiency 03/23/2019   Bilateral carotid artery stenosis 12/15/2018   Moderate aortic valve stenosis 12/15/2018   BMI 45.0-49.9, adult (HCC) 11/10/2018   Cerebrovascular accident (HCC) 05/02/2016   Hemiparesis affecting right side as late effect of cerebrovascular accident (HCC) 05/01/2016   Brain tumor (HCC) 11/24/2014   Gastro-esophageal reflux disease without esophagitis 11/24/2014   Accident due to mechanical fall without injury 08/03/2014   Essential hypertension 06/13/2014   Mixed hyperlipidemia 06/13/2014   Unspecified sequelae of cerebral infarction 06/13/2014   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/2014   Type 2 diabetes  mellitus without complication (HCC) 01/06/2014   Neck mass 09/08/2013   Swimmer's ear 09/08/2013   Erectile dysfunction 01/03/2013   Prediabetes 01/03/2013   ONSET DATE: 05/09/2016  REFERRING DIAG: W41.324 (ICD-10-CM) - Hemiplegia and hemiparesis following cerebral infarction affecting right dominant side   THERAPY DIAG:  Unsteadiness on feet  Difficulty in walking, not elsewhere classified  Other abnormalities of gait and mobility  Abnormality of gait and mobility  Muscle weakness (generalized)  Other lack of coordination  Left-sided weakness  Hemiplegia and hemiparesis following cerebral infarction affecting right dominant side (HCC)   Rationale for Evaluation and Treatment Rehabilitation  SUBJECTIVE:  SUBJECTIVE STATEMENT  Patient denies any new issues- States had a good weekend. .   Pt accompanied by: self   PERTINENT HISTORY:   The pt is a pleasant 58 yo male who returns to PT for evaluation of deficits s/o old CVA. Pt with hx of CVA Feb 2018. He was previously fully independent, but CVA caused significant deficits, mainly affecting his right side. Pt reports continued difficulty with walking and transfers. Pt still using WC for majority of mobility, otherwise he uses his RW. Pt lives with his mom who is his main caregiver. Patient had outpatient PT previously in clinic in 2023. He reports since discharge he has been completing his HEP and walking about every day. Pt would like to improve his walking mainly, but also would like to work on balance. He also reports difficulty with transfers. Pt's mom and his caregiver helps him with ADLs. However, he says he is now waiting for a new caregiver. Prior to this pt had caregiver 3x/week. PMH significant for hx of brain tumor, past CVA, DMII and  HTN. See chart for additional information.  PAIN:  Are you having pain? Pt reports no pain currently  PRECAUTIONS: fall  WEIGHT BEARING RESTRICTIONS none  PLOF: required assistance with ADLs (old CVA), prior to CVA in 2018 was indep.  History of Falls: Pt reports no recent falls  Home information: Pt lives with mother who is his primary caregiver. Previously had home aide, 3x/week but reports currently waiting for new one.  PATIENT GOALS: Pt would like to improve his ability to ambulate.  OBJECTIVE:  taken at [facility] unless otherwise noted:   DIAGNOSTIC FINDINGS: No recent imaging per chart  Objective measures taken at eval unless otherwise specified:   Posture: forward head posture, rounded shoulders  MMT LE: grossly  4+/5, exception hip flexors and abductors 4/5 each bilat   Functional tests:  5xSTS:  34 sec, use of BUE (multiple initial attempts with posterior LOB in chair)   : 0.096 m/s with RW, close CGA, and WC follow  Gait for distance (goal 150 ft): deferred due to time  Gait mechanics: impaired, decreased speed (see ), crouched posture, poor postural stability and relies heavily on BUE support on RW. Pt now with bilat AFOS   FOTO: 44 (goal 50)    TODAY'S TREATMENT 05/21/23      Wearing B LE custom AFOs  THERAPEUTIC ACTIVITIES:   - STSs from w/c to standing at support bar  (1 arm on armrest and other on support bar) x 10 reps with min A for balance and continued cuing for bringing feet back underneath BOS, anterior trunk lean/weight shift, and then transitioning to hip extension with trunk extension - STS with only 1 UE on support bar x 10 reps  - STS with only 1 UE on Armrest x 15 then once propelling forward- able to reach with opp UE for support bar (to emphasize LE power)    Dynamic standing balance  at support bar- high knee march- x 20 reps each side- cuing for increased hip/knee flexion to improve swing phase of gait.  Static stand without  UE support x 3 min x 2 trials without UE support.    THEREX:  Standing hip flex stretch x 45 sec hold x 2 each LE     PATIENT EDUCATION: Education details: Pt educated throughout session about proper posture and technique with exercises. Improved exercise technique, movement at target joints, use of target muscles after min to mod verbal,  visual, tactile cues.  Person educated: Patient Education method: Explanation, Demonstration, Tactile cues, and Verbal cues Education comprehension: verbalized understanding, returned demonstration, and verbal cues required  HOME EXERCISE PROGRAM:  Standing hip flexor stretch at counter, prolonged standing with erect posture for gluteal activation, HS curls with RTB   Added the below HEP on 05/15/23  Access Code: [email or web address] URL: [email or web address] Date: 05/15/2023 Prepared by: Casimiro Needle  Exercises - Seated Long Arc Quad with Ankle Weight  - 1 x daily - 7 x weekly - 2 sets - 10 reps   GOALS: Goals reviewed with patient? No  SHORT TERM GOALS: Target date: 11/12/2022  Pt will be independent with HEP in order to improve strength and balance in order to decrease fall risk and improve function at home. Baseline: instructed in HEP today, to provide printout next visit Goal status: MET    LONG TERM GOALS: Target date: 06/12/2023  Pt will increase FOTO to at least 50 in order to demonstrate improvement in function related to mobility and QoL. Baseline: 44 7/29:48 9/10:40 12/24/2022= 51 Goal status: MET  2.  Pt (< 60 yrs old) will complete 5xSTS test < 30 seconds indicating an increase in LE strength and improved balance. Baseline: 10/01/22 34 sec (multiple initial attempts with posterior LOB in chair) 12/20: 49 seconds 11/04/22:1:06, min A on multiple attempts to maintain balance and pt stays in flexed posture throughout test when coming to standing.   9/10: 27.5 sec, Pushes WC post consistently, close CGA  10/22: 47 seconds  first attempt 12/5: 1 minute, 37 seconds, multiple attempts to get first stand, but no difficulty after that; 03/20/2023= 1 min 2 sec with 1UE on walker and 1 on armrest- some posterior lean- Close CGA Goal status: ONGOING  3.  Pt will increase to > 0.5 m/s (30 sec) as to improve gait speed for better home ambulation and reduce fall risk. Baseline: 10/01/22 0.096 m/s with RW, close CGA, and WC follow previously 12/27: 0.11 m/s 11/04/22: .056 m/s  9/10: 076 m/s 10/22: 0.64 m/s  11/26: .076 m/s; 03/20/2023= 0.08 m/s; 04/22/2023= 0.089 m/s; 05/15/2023 = 0.079 m/s Goal status: PROGRESSING  4.  Pt will increase hip flexor and abductor  gross strength to 4+/5 bilat as to improve functional strength for independent gait, increased standing tolerance, and increased ADL ability. Baseline: 10/01/22 4+/5, exception hip flexors and abductors 4/5 each bilat  (previously 03/27/22: 4+/5 ) 9/10: Hip flexor 4/5, hip abductor 4+/5 10/22: hip flexor 4/5 hip abductor 4+/5 11/26: hip flexor 4+/5 hip abductor 4+/5 Goal status: MET  5.  Pt will ambulate 150 ft with BRW and WC follow with no rest breaks in less than 12 min to allow for improved mobility within home and increased independence. Baseline: 10/03/22 150' with BRW 04/03/22: 105 ft with BRW 7/29: 100 in 9:30 with pediatric RW 9/10:101 ft with BRW stooped posture worsened throughout test 12/24/2022- 150 feet using BRW, CGA with w/c follow- in 10 min 17 sec. Will keep goal to make sure he can do this consistently 10/22: 119 ft with BRW 8 min 23 seconds terminated by patient  11/26: 148 ft with BRW 14 min 34 seconds terminated by patient 03/20/2023= 152 feet using BRW, CGA and w/c follow in 14 min 3 sec- Need to keep goal active to ensure consistency but will revised and add a time element to goal. 04/22/2023= 162 feet in 11 min 54 sec using bariatric RW, CGA and close CGA assist Goal status: Progressing and  will keep goal active to ensure consistency.      ASSESSMENT :  CLINICAL IMPRESSION: Patient able to tolerate standing hip flex stretch and responded well to all cues to perform correctly. He demonstrated better overll standing without UE support and no knee buckling today. He also demonstrated less UE Support with transfers attempting to rely more on LE for power with standing. Pt remains highly motivated and will benefit from continued skilled physical therapy to further progress his strength and balance to decrease fall risk, improve QOL, and attain therapy goals.    OBJECTIVE IMPAIRMENTS Abnormal gait, decreased activity tolerance, decreased balance, decreased coordination, decreased endurance, decreased mobility, difficulty walking, decreased ROM, decreased strength, improper body mechanics, postural dysfunction, and obesity.   ACTIVITY LIMITATIONS carrying, lifting, bending, sitting, standing, squatting, stairs, transfers, bed mobility, continence, bathing, toileting, dressing, hygiene/grooming, and locomotion level  PARTICIPATION LIMITATIONS: meal prep, cleaning, laundry, interpersonal relationship, driving, shopping, community activity, and yard work  PERSONAL FACTORS Fitness, Past/current experiences, Time since onset of injury/illness/exacerbation, and 3+ comorbidities: hx of brain tumor, past CVA, HTN, obesity, learning disability, DMII  are also affecting patient's functional outcome.   REHAB POTENTIAL: Fair    CLINICAL DECISION MAKING: Stable/uncomplicated  EVALUATION COMPLEXITY: Low  PLAN: PT FREQUENCY: 1-2x/week  PT DURATION: 12 weeks  PLANNED INTERVENTIONS: Therapeutic exercises, Therapeutic activity, Neuromuscular re-education, Balance training, Gait training, Patient/Family education, Joint mobilization, Stair training, Vestibular training, Orthotic/Fit training, DME instructions, Dry Needling, Electrical stimulation, Wheelchair mobility training, Cryotherapy, Moist heat, Compression bandaging, Manual therapy, and  Re-evaluation  PLAN FOR NEXT SESSION: Continue having patient stand targeting hip flexor stretch with good posture during rest breaks. Continue with functional strengthening exercises of the LE's and gait training using RW. Work on lateral stepping and backwards stepping in // bars. Could utilize [employer] for higher intensity dynamic reaching during standing balance.    Lenda Kelp, PT Physical Therapist - [facility]  [facility]  9:08 AM 05/21/23

## 2023-05-22 ENCOUNTER — Ambulatory Visit: Payer: 59

## 2023-05-22 DIAGNOSIS — R278 Other lack of coordination: Secondary | ICD-10-CM

## 2023-05-22 DIAGNOSIS — M6281 Muscle weakness (generalized): Secondary | ICD-10-CM

## 2023-05-22 DIAGNOSIS — R2681 Unsteadiness on feet: Secondary | ICD-10-CM | POA: Diagnosis not present

## 2023-05-22 DIAGNOSIS — R262 Difficulty in walking, not elsewhere classified: Secondary | ICD-10-CM

## 2023-05-22 DIAGNOSIS — R2689 Other abnormalities of gait and mobility: Secondary | ICD-10-CM

## 2023-05-22 DIAGNOSIS — R531 Weakness: Secondary | ICD-10-CM

## 2023-05-22 DIAGNOSIS — R269 Unspecified abnormalities of gait and mobility: Secondary | ICD-10-CM

## 2023-05-22 NOTE — Therapy (Signed)
OUTPATIENT PHYSICAL THERAPY NEURO TREATMENT   Patient Name: Curtis Powell MRN: [id number] DOB:January 06, 1966, 58 y.o., male Today's Date: 05/25/2023  PCP: Enid Baas, MD  REFERRING PROVIDER: Enid Baas, MD    PT End of Session - 05/25/23 1046     Visit Number 59    Number of Visits 67    Date for PT Re-Evaluation 06/12/23    Progress Note Due on Visit 60    PT Start Time 1315    PT Stop Time 1358    PT Time Calculation (min) 43 min    Equipment Utilized During Treatment Gait belt    Activity Tolerance Patient tolerated treatment well;No increased pain    Behavior During Therapy WFL for tasks assessed/performed                 Past Medical History:  Diagnosis Date   Asthma    GERD (gastroesophageal reflux disease)    Hyperlipidemia    Hypertension    Obesity    Stroke [facility])    Past Surgical History:  Procedure Laterality Date   BRAIN SURGERY     Patient Active Problem List   Diagnosis Date Noted   Impaired glucose tolerance 03/23/2020   Learning disability 03/23/2020   Urinary and fecal incontinence 06/14/2019   Chronic pain syndrome 03/23/2019   Constipation 03/23/2019   Dysarthria 03/23/2019   Dysphagia 03/23/2019   Learning difficulty 03/23/2019   Morbid obesity (HCC) 03/23/2019   Muscle weakness 03/23/2019   Vitamin D deficiency 03/23/2019   Bilateral carotid artery stenosis 12/15/2018   Moderate aortic valve stenosis 12/15/2018   BMI 45.0-49.9, adult (HCC) 11/10/2018   Cerebrovascular accident (HCC) 05/02/2016   Hemiparesis affecting right side as late effect of cerebrovascular accident (HCC) 05/01/2016   Brain tumor (HCC) 11/24/2014   Gastro-esophageal reflux disease without esophagitis 11/24/2014   Accident due to mechanical fall without injury 08/03/2014   Essential hypertension 06/13/2014   Mixed hyperlipidemia 06/13/2014   Unspecified sequelae of cerebral infarction 06/13/2014   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/2014   Type 2 diabetes  mellitus without complication (HCC) 01/06/2014   Neck mass 09/08/2013   Swimmer's ear 09/08/2013   Erectile dysfunction 01/03/2013   Prediabetes 01/03/2013   ONSET DATE: 05/09/2016  REFERRING DIAG: W09.811 (ICD-10-CM) - Hemiplegia and hemiparesis following cerebral infarction affecting right dominant side   THERAPY DIAG:  Unsteadiness on feet  Difficulty in walking, not elsewhere classified  Other abnormalities of gait and mobility  Abnormality of gait and mobility  Muscle weakness (generalized)  Other lack of coordination  Left-sided weakness   Rationale for Evaluation and Treatment Rehabilitation  SUBJECTIVE:  SUBJECTIVE STATEMENT  Patient reports good compliance with HEP and practicing standing more often .   Pt accompanied by: self   PERTINENT HISTORY:   The pt is a pleasant 58 yo male who returns to PT for evaluation of deficits s/o old CVA. Pt with hx of CVA Feb 2018. He was previously fully independent, but CVA caused significant deficits, mainly affecting his right side. Pt reports continued difficulty with walking and transfers. Pt still using WC for majority of mobility, otherwise he uses his RW. Pt lives with his mom who is his main caregiver. Patient had outpatient PT previously in clinic in 2023. He reports since discharge he has been completing his HEP and walking about every day. Pt would like to improve his walking mainly, but also would like to work on balance. He also reports difficulty with transfers. Pt's mom and his caregiver helps him with ADLs. However, he says he is now waiting for a new caregiver. Prior to this pt had caregiver 3x/week. PMH significant for hx of brain tumor, past CVA, DMII and HTN. See chart for additional information.  PAIN:  Are you having pain? Pt  reports no pain currently  PRECAUTIONS: fall  WEIGHT BEARING RESTRICTIONS none  PLOF: required assistance with ADLs (old CVA), prior to CVA in 2018 was indep.  History of Falls: Pt reports no recent falls  Home information: Pt lives with mother who is his primary caregiver. Previously had home aide, 3x/week but reports currently waiting for new one.  PATIENT GOALS: Pt would like to improve his ability to ambulate.  OBJECTIVE:  taken at [facility] unless otherwise noted:   DIAGNOSTIC FINDINGS: No recent imaging per chart  Objective measures taken at eval unless otherwise specified:   Posture: forward head posture, rounded shoulders  MMT LE: grossly  4+/5, exception hip flexors and abductors 4/5 each bilat   Functional tests:  5xSTS:  34 sec, use of BUE (multiple initial attempts with posterior LOB in chair)   : 0.096 m/s with RW, close CGA, and WC follow  Gait for distance (goal 150 ft): deferred due to time  Gait mechanics: impaired, decreased speed (see ), crouched posture, poor postural stability and relies heavily on BUE support on RW. Pt now with bilat AFOS   FOTO: 44 (goal 50)    TODAY'S TREATMENT 05/22/2023      Wearing B LE custom AFOs  THERAPEUTIC ACTIVITIES:   - STSs from w/c to standing at support bar  (1 arm on armrest and other on support bar) x 10 reps with min A for balance and continued cuing for bringing feet back underneath BOS, anterior trunk lean/weight shift, and then transitioning to hip extension with trunk extension  -Ambulation with bariatric RW- CGA with close W/c follow- Focusing on continuous motion and trying to step reciprocally for imrpoved gait quality- x 250 feet with 1 seated rest break after 175 feet.     THEREX:  Standing hip flex stretch x 45 sec hold x 2 each LE     PATIENT EDUCATION: Education details: Pt educated throughout session about proper posture and technique with exercises. Improved exercise technique,  movement at target joints, use of target muscles after min to mod verbal, visual, tactile cues.  Person educated: Patient Education method: Explanation, Demonstration, Tactile cues, and Verbal cues Education comprehension: verbalized understanding, returned demonstration, and verbal cues required  HOME EXERCISE PROGRAM:  Standing hip flexor stretch at counter, prolonged standing with erect posture for gluteal activation, HS curls with  RTB   Added the below HEP on 05/15/23  Access Code: [email or web address] URL: [email or web address] Date: 05/15/2023 Prepared by: Casimiro Needle  Exercises - Seated Long Arc Quad with Ankle Weight  - 1 x daily - 7 x weekly - 2 sets - 10 reps   GOALS: Goals reviewed with patient? No  SHORT TERM GOALS: Target date: 11/12/2022  Pt will be independent with HEP in order to improve strength and balance in order to decrease fall risk and improve function at home. Baseline: instructed in HEP today, to provide printout next visit Goal status: MET    LONG TERM GOALS: Target date: 06/12/2023  Pt will increase FOTO to at least 50 in order to demonstrate improvement in function related to mobility and QoL. Baseline: 44 7/29:48 9/10:40 12/24/2022= 51 Goal status: MET  2.  Pt (< 60 yrs old) will complete 5xSTS test < 30 seconds indicating an increase in LE strength and improved balance. Baseline: 10/01/22 34 sec (multiple initial attempts with posterior LOB in chair) 12/20: 49 seconds 11/04/22:1:06, min A on multiple attempts to maintain balance and pt stays in flexed posture throughout test when coming to standing.   9/10: 27.5 sec, Pushes WC post consistently, close CGA  10/22: 47 seconds first attempt 12/5: 1 minute, 37 seconds, multiple attempts to get first stand, but no difficulty after that; 03/20/2023= 1 min 2 sec with 1UE on walker and 1 on armrest- some posterior lean- Close CGA Goal status: ONGOING  3.  Pt will increase to > 0.5 m/s (30 sec) as  to improve gait speed for better home ambulation and reduce fall risk. Baseline: 10/01/22 0.096 m/s with RW, close CGA, and WC follow previously 12/27: 0.11 m/s 11/04/22: .056 m/s  9/10: 076 m/s 10/22: 0.64 m/s  11/26: .076 m/s; 03/20/2023= 0.08 m/s; 04/22/2023= 0.089 m/s; 05/15/2023 = 0.079 m/s Goal status: PROGRESSING  4.  Pt will increase hip flexor and abductor  gross strength to 4+/5 bilat as to improve functional strength for independent gait, increased standing tolerance, and increased ADL ability. Baseline: 10/01/22 4+/5, exception hip flexors and abductors 4/5 each bilat  (previously 03/27/22: 4+/5 ) 9/10: Hip flexor 4/5, hip abductor 4+/5 10/22: hip flexor 4/5 hip abductor 4+/5 11/26: hip flexor 4+/5 hip abductor 4+/5 Goal status: MET  5.  Pt will ambulate 150 ft with BRW and WC follow with no rest breaks in less than 12 min to allow for improved mobility within home and increased independence. Baseline: 10/03/22 150' with BRW 04/03/22: 105 ft with BRW 7/29: 100 in 9:30 with pediatric RW 9/10:101 ft with BRW stooped posture worsened throughout test 12/24/2022- 150 feet using BRW, CGA with w/c follow- in 10 min 17 sec. Will keep goal to make sure he can do this consistently 10/22: 119 ft with BRW 8 min 23 seconds terminated by patient  11/26: 148 ft with BRW 14 min 34 seconds terminated by patient 03/20/2023= 152 feet using BRW, CGA and w/c follow in 14 min 3 sec- Need to keep goal active to ensure consistency but will revised and add a time element to goal. 04/22/2023= 162 feet in 11 min 54 sec using bariatric RW, CGA and close CGA assist Goal status: Progressing and will keep goal active to ensure consistency.     ASSESSMENT :  CLINICAL IMPRESSION: Patient continued to make progress with gait quality today- much improved overall posture, ability to more continuously and more reciprocal steps.  Pt remains highly motivated and will benefit from continued skilled  physical therapy to further  progress his strength and balance to decrease fall risk, improve QOL, and attain therapy goals.    OBJECTIVE IMPAIRMENTS Abnormal gait, decreased activity tolerance, decreased balance, decreased coordination, decreased endurance, decreased mobility, difficulty walking, decreased ROM, decreased strength, improper body mechanics, postural dysfunction, and obesity.   ACTIVITY LIMITATIONS carrying, lifting, bending, sitting, standing, squatting, stairs, transfers, bed mobility, continence, bathing, toileting, dressing, hygiene/grooming, and locomotion level  PARTICIPATION LIMITATIONS: meal prep, cleaning, laundry, interpersonal relationship, driving, shopping, community activity, and yard work  PERSONAL FACTORS Fitness, Past/current experiences, Time since onset of injury/illness/exacerbation, and 3+ comorbidities: hx of brain tumor, past CVA, HTN, obesity, learning disability, DMII  are also affecting patient's functional outcome.   REHAB POTENTIAL: Fair    CLINICAL DECISION MAKING: Stable/uncomplicated  EVALUATION COMPLEXITY: Low  PLAN: PT FREQUENCY: 1-2x/week  PT DURATION: 12 weeks  PLANNED INTERVENTIONS: Therapeutic exercises, Therapeutic activity, Neuromuscular re-education, Balance training, Gait training, Patient/Family education, Joint mobilization, Stair training, Vestibular training, Orthotic/Fit training, DME instructions, Dry Needling, Electrical stimulation, Wheelchair mobility training, Cryotherapy, Moist heat, Compression bandaging, Manual therapy, and Re-evaluation  PLAN FOR NEXT SESSION: Continue having patient stand targeting hip flexor stretch with good posture during rest breaks. Continue with functional strengthening exercises of the LE's and gait training using RW. Work on lateral stepping and backwards stepping in // bars. Could utilize [employer] for higher intensity dynamic reaching during standing balance.    Lenda Kelp, PT Physical Therapist - [place]   [facility]  10:55 AM 05/25/23

## 2023-05-27 ENCOUNTER — Ambulatory Visit: Payer: 59

## 2023-05-27 DIAGNOSIS — R2681 Unsteadiness on feet: Secondary | ICD-10-CM

## 2023-05-27 DIAGNOSIS — R278 Other lack of coordination: Secondary | ICD-10-CM

## 2023-05-27 DIAGNOSIS — R2689 Other abnormalities of gait and mobility: Secondary | ICD-10-CM

## 2023-05-27 DIAGNOSIS — M6281 Muscle weakness (generalized): Secondary | ICD-10-CM

## 2023-05-27 DIAGNOSIS — R269 Unspecified abnormalities of gait and mobility: Secondary | ICD-10-CM

## 2023-05-27 DIAGNOSIS — R262 Difficulty in walking, not elsewhere classified: Secondary | ICD-10-CM

## 2023-05-27 DIAGNOSIS — R531 Weakness: Secondary | ICD-10-CM

## 2023-05-27 NOTE — Therapy (Signed)
OUTPATIENT PHYSICAL THERAPY NEURO TREATMENT/Physical Therapy Progress Note   Dates of reporting period  04/22/2023   to   05/27/2023   Patient Name: Curtis Powell MRN: [id number] DOB:1965/12/08, 58 y.o., male Today's Date: 05/27/2023  PCP: Enid Baas, MD  REFERRING PROVIDER: Enid Baas, MD    PT End of Session - 05/27/23 1323     Visit Number 60    Number of Visits 67    Date for PT Re-Evaluation 06/12/23    Progress Note Due on Visit 70    PT Start Time 1315    PT Stop Time 1356    PT Time Calculation (min) 41 min    Equipment Utilized During Treatment Gait belt    Activity Tolerance Patient tolerated treatment well;No increased pain    Behavior During Therapy WFL for tasks assessed/performed                 Past Medical History:  Diagnosis Date   Asthma    GERD (gastroesophageal reflux disease)    Hyperlipidemia    Hypertension    Obesity    Stroke [facility])    Past Surgical History:  Procedure Laterality Date   BRAIN SURGERY     Patient Active Problem List   Diagnosis Date Noted   Impaired glucose tolerance 03/23/2020   Learning disability 03/23/2020   Urinary and fecal incontinence 06/14/2019   Chronic pain syndrome 03/23/2019   Constipation 03/23/2019   Dysarthria 03/23/2019   Dysphagia 03/23/2019   Learning difficulty 03/23/2019   Morbid obesity (HCC) 03/23/2019   Muscle weakness 03/23/2019   Vitamin D deficiency 03/23/2019   Bilateral carotid artery stenosis 12/15/2018   Moderate aortic valve stenosis 12/15/2018   BMI 45.0-49.9, adult (HCC) 11/10/2018   Cerebrovascular accident (HCC) 05/02/2016   Hemiparesis affecting right side as late effect of cerebrovascular accident (HCC) 05/01/2016   Brain tumor (HCC) 11/24/2014   Gastro-esophageal reflux disease without esophagitis 11/24/2014   Accident due to mechanical fall without injury 08/03/2014   Essential hypertension 06/13/2014   Mixed hyperlipidemia 06/13/2014   Unspecified  sequelae of cerebral infarction 06/13/2014   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/2014   Type 2 diabetes mellitus without complication (HCC) 01/06/2014   Neck mass 09/08/2013   Swimmer's ear 09/08/2013   Erectile dysfunction 01/03/2013   Prediabetes 01/03/2013   ONSET DATE: 05/09/2016  REFERRING DIAG: G40.102 (ICD-10-CM) - Hemiplegia and hemiparesis following cerebral infarction affecting right dominant side   THERAPY DIAG:  Unsteadiness on feet  Difficulty in walking, not elsewhere classified  Other abnormalities of gait and mobility  Abnormality of gait and mobility  Muscle weakness (generalized)  Other lack of coordination  Left-sided weakness   Rationale for Evaluation and Treatment Rehabilitation  SUBJECTIVE:  SUBJECTIVE STATEMENT  Patient reports having a good week. Reports no falls.    Pt accompanied by: self   PERTINENT HISTORY:   The pt is a pleasant 58 yo male who returns to PT for evaluation of deficits s/o old CVA. Pt with hx of CVA Feb 2018. He was previously fully independent, but CVA caused significant deficits, mainly affecting his right side. Pt reports continued difficulty with walking and transfers. Pt still using WC for majority of mobility, otherwise he uses his RW. Pt lives with his mom who is his main caregiver. Patient had outpatient PT previously in clinic in 2023. He reports since discharge he has been completing his HEP and walking about every day. Pt would like to improve his walking mainly, but also would like to work on balance. He also reports difficulty with transfers. Pt's mom and his caregiver helps him with ADLs. However, he says he is now waiting for a new caregiver. Prior to this pt had caregiver 3x/week. PMH significant for hx of brain tumor, past CVA, DMII and HTN. See  chart for additional information.  PAIN:  Are you having pain? Pt reports no pain currently  PRECAUTIONS: fall  WEIGHT BEARING RESTRICTIONS none  PLOF: required assistance with ADLs (old CVA), prior to CVA in 2018 was indep.  History of Falls: Pt reports no recent falls  Home information: Pt lives with mother who is his primary caregiver. Previously had home aide, 3x/week but reports currently waiting for new one.  PATIENT GOALS: Pt would like to improve his ability to ambulate.  OBJECTIVE:  taken at [facility] unless otherwise noted:   DIAGNOSTIC FINDINGS: No recent imaging per chart  Objective measures taken at eval unless otherwise specified:   Posture: forward head posture, rounded shoulders  MMT LE: grossly  4+/5, exception hip flexors and abductors 4/5 each bilat   Functional tests:  5xSTS:  34 sec, use of BUE (multiple initial attempts with posterior LOB in chair)   : 0.096 m/s with RW, close CGA, and WC follow  Gait for distance (goal 150 ft): deferred due to time  Gait mechanics: impaired, decreased speed (see ), crouched posture, poor postural stability and relies heavily on BUE support on RW. Pt now with bilat AFOS   FOTO: 44 (goal 50)    TODAY'S TREATMENT 05/27/2023   Physical therapy treatment session today consisted of completing assessment of goals and administration of testing as demonstrated and documented in flow sheet, treatment, and goals section of this note. Addition treatments may be found below.     10 Meter Walk Test: Patient instructed to walk 10 meters (32.8 ft) as quickly and as safely as possible at their normal speed x2 and at a fast speed x2. Time measured from 2 meter mark to 8 meter mark to accommodate ramp-up and ramp-down.  Normal speed 1: 0.11 m/s Average Normal speed: 0.11 m/s Cut off scores: <0.4 m/s = household Ambulator, 0.4-0.8 m/s = limited community Ambulator, >0.8 m/s = community Ambulator, >1.2 m/s = crossing a street,  <1.0 = increased fall risk MCID 0.05 m/s (small), 0.13 m/s (moderate), 0.06 m/s (significant)  (ANPTA Core Set of Outcome Measures for Adults with Neurologic Conditions, 2018)   Wearing B LE custom AFOs  THERAPEUTIC ACTIVITIES:   - STSs from w/c to standing at support bar  (1 arm on armrest and other on support bar) 2sets x 10 reps with min A for balance and continued cuing for bringing feet back underneath BOS, anterior trunk  lean/weight shift, and then transitioning to hip extension with trunk extension  Step tap 2 sets of 12 reps  -Ambulation with bariatric RW- CGA with close W/c follow- Focusing on continuous motion and trying to step reciprocally for imrpoved gait quality- x 170 feet         PATIENT EDUCATION: Education details: Pt educated throughout session about proper posture and technique with exercises. Improved exercise technique, movement at target joints, use of target muscles after min to mod verbal, visual, tactile cues.  Person educated: Patient Education method: Explanation, Demonstration, Tactile cues, and Verbal cues Education comprehension: verbalized understanding, returned demonstration, and verbal cues required  HOME EXERCISE PROGRAM:  Standing hip flexor stretch at counter, prolonged standing with erect posture for gluteal activation, HS curls with RTB   Added the below HEP on 05/15/23  Access Code: [email or web address] URL: [email or web address] Date: 05/15/2023 Prepared by: Casimiro Needle  Exercises - Seated Long Arc Quad with Ankle Weight  - 1 x daily - 7 x weekly - 2 sets - 10 reps   GOALS: Goals reviewed with patient? No  SHORT TERM GOALS: Target date: 11/12/2022  Pt will be independent with HEP in order to improve strength and balance in order to decrease fall risk and improve function at home. Baseline: instructed in HEP today, to provide printout next visit Goal status: MET    LONG TERM GOALS: Target date: 06/12/2023  Pt will increase  FOTO to at least 50 in order to demonstrate improvement in function related to mobility and QoL. Baseline: 44 7/29:48 9/10:40 12/24/2022= 51 Goal status: MET  2.  Pt (< 60 yrs old) will complete 5xSTS test < 30 seconds indicating an increase in LE strength and improved balance. Baseline: 10/01/22 34 sec (multiple initial attempts with posterior LOB in chair) 12/20: 49 seconds 11/04/22:1:06, min A on multiple attempts to maintain balance and pt stays in flexed posture throughout test when coming to standing.   9/10: 27.5 sec, Pushes WC post consistently, close CGA  10/22: 47 seconds first attempt 12/5: 1 minute, 37 seconds, multiple attempts to get first stand, but no difficulty after that; 03/20/2023= 1 min 2 sec with 1UE on walker and 1 on armrest- some posterior lean- Close CGA Goal status: ONGOING  3.  Pt will increase to > 0.5 m/s (30 sec) as to improve gait speed for better home ambulation and reduce fall risk. Baseline: 10/01/22 0.096 m/s with RW, close CGA, and WC follow previously 12/27: 0.11 m/s 11/04/22: .056 m/s  9/10: 076 m/s 10/22: 0.64 m/s  11/26: .076 m/s; 03/20/2023= 0.08 m/s; 04/22/2023= 0.089 m/s; 05/15/2023 = 0.079 m/s; 05/27/2023= 0.11 m/s Goal status: PROGRESSING  4.  Pt will increase hip flexor and abductor  gross strength to 4+/5 bilat as to improve functional strength for independent gait, increased standing tolerance, and increased ADL ability. Baseline: 10/01/22 4+/5, exception hip flexors and abductors 4/5 each bilat  (previously 03/27/22: 4+/5 ) 9/10: Hip flexor 4/5, hip abductor 4+/5 10/22: hip flexor 4/5 hip abductor 4+/5 11/26: hip flexor 4+/5 hip abductor 4+/5 Goal status: MET  5.  Pt will ambulate 150 ft with BRW and WC follow with no rest breaks in less than 12 min to allow for improved mobility within home and increased independence. Baseline: 10/03/22 150' with BRW 04/03/22: 105 ft with BRW 7/29: 100 in 9:30 with pediatric RW 9/10:101 ft with BRW stooped  posture worsened throughout test 12/24/2022- 150 feet using BRW, CGA with w/c follow- in 10 min 17 sec.  Will keep goal to make sure he can do this consistently 10/22: 119 ft with BRW 8 min 23 seconds terminated by patient  11/26: 148 ft with BRW 14 min 34 seconds terminated by patient 03/20/2023= 152 feet using BRW, CGA and w/c follow in 14 min 3 sec- Need to keep goal active to ensure consistency but will revised and add a time element to goal. 04/22/2023= 162 feet in 11 min 54 sec using bariatric RW, CGA and close CGA assist; 05/27/2023- 170 feet without rest break using bariatric RW.  Goal status: Progressing and will keep goal active to ensure consistency.     ASSESSMENT :  CLINICAL IMPRESSION: Patient presents with good motivation for progress visit today. He focused well and able to stand 2 sets of 10 with just CGA using 1 arm on bar at // bars and 1 arm pushing up from armrest. He also demonstrated improving gait distance prior to need to rest and overall much improved gait speed - able to keep moving more continuously today. Patient's condition has the potential to improve in response to therapy. Maximum improvement is yet to be obtained. The anticipated improvement is attainable and reasonable in a generally predictable time.   Pt remains highly motivated and will benefit from continued skilled physical therapy to further progress his strength and balance to decrease fall risk, improve QOL, and attain therapy goals.    OBJECTIVE IMPAIRMENTS Abnormal gait, decreased activity tolerance, decreased balance, decreased coordination, decreased endurance, decreased mobility, difficulty walking, decreased ROM, decreased strength, improper body mechanics, postural dysfunction, and obesity.   ACTIVITY LIMITATIONS carrying, lifting, bending, sitting, standing, squatting, stairs, transfers, bed mobility, continence, bathing, toileting, dressing, hygiene/grooming, and locomotion level  PARTICIPATION  LIMITATIONS: meal prep, cleaning, laundry, interpersonal relationship, driving, shopping, community activity, and yard work  PERSONAL FACTORS Fitness, Past/current experiences, Time since onset of injury/illness/exacerbation, and 3+ comorbidities: hx of brain tumor, past CVA, HTN, obesity, learning disability, DMII  are also affecting patient's functional outcome.   REHAB POTENTIAL: Fair    CLINICAL DECISION MAKING: Stable/uncomplicated  EVALUATION COMPLEXITY: Low  PLAN: PT FREQUENCY: 1-2x/week  PT DURATION: 12 weeks  PLANNED INTERVENTIONS: Therapeutic exercises, Therapeutic activity, Neuromuscular re-education, Balance training, Gait training, Patient/Family education, Joint mobilization, Stair training, Vestibular training, Orthotic/Fit training, DME instructions, Dry Needling, Electrical stimulation, Wheelchair mobility training, Cryotherapy, Moist heat, Compression bandaging, Manual therapy, and Re-evaluation  PLAN FOR NEXT SESSION: Continue having patient stand targeting hip flexor stretch with good posture during rest breaks. Continue with functional strengthening exercises of the LE's and gait training using RW. Work on lateral stepping and backwards stepping in // bars. Could utilize [employer] for higher intensity dynamic reaching during standing balance.    Lenda Kelp, PT Physical Therapist - [place]  [facility]  3:24 PM 05/27/23

## 2023-05-29 ENCOUNTER — Ambulatory Visit: Payer: 59

## 2023-06-03 ENCOUNTER — Ambulatory Visit: Payer: 59

## 2023-06-03 DIAGNOSIS — R531 Weakness: Secondary | ICD-10-CM

## 2023-06-03 DIAGNOSIS — R262 Difficulty in walking, not elsewhere classified: Secondary | ICD-10-CM

## 2023-06-03 DIAGNOSIS — I69351 Hemiplegia and hemiparesis following cerebral infarction affecting right dominant side: Secondary | ICD-10-CM

## 2023-06-03 DIAGNOSIS — R2689 Other abnormalities of gait and mobility: Secondary | ICD-10-CM

## 2023-06-03 DIAGNOSIS — M6281 Muscle weakness (generalized): Secondary | ICD-10-CM

## 2023-06-03 DIAGNOSIS — R278 Other lack of coordination: Secondary | ICD-10-CM

## 2023-06-03 DIAGNOSIS — R269 Unspecified abnormalities of gait and mobility: Secondary | ICD-10-CM

## 2023-06-03 DIAGNOSIS — R2681 Unsteadiness on feet: Secondary | ICD-10-CM | POA: Diagnosis not present

## 2023-06-03 DIAGNOSIS — R296 Repeated falls: Secondary | ICD-10-CM

## 2023-06-03 NOTE — Therapy (Signed)
 OUTPATIENT PHYSICAL THERAPY NEURO TREATMENT  Patient Name: RAPHEAL MASSO MRN: [id number] DOB:09-Sep-1965, 58 y.o., male Today's Date: 06/03/2023  PCP: Enid Baas, MD  REFERRING PROVIDER: Enid Baas, MD    PT End of Session - 06/03/23 1303     Visit Number 61    Number of Visits 67    Date for PT Re-Evaluation 06/12/23    Progress Note Due on Visit 70    PT Start Time 1312    PT Stop Time 1358    PT Time Calculation (min) 46 min    Equipment Utilized During Treatment Gait belt    Activity Tolerance Patient tolerated treatment well;No increased pain    Behavior During Therapy WFL for tasks assessed/performed                 Past Medical History:  Diagnosis Date   Asthma    GERD (gastroesophageal reflux disease)    Hyperlipidemia    Hypertension    Obesity    Stroke [facility])    Past Surgical History:  Procedure Laterality Date   BRAIN SURGERY     Patient Active Problem List   Diagnosis Date Noted   Impaired glucose tolerance 03/23/2020   Learning disability 03/23/2020   Urinary and fecal incontinence 06/14/2019   Chronic pain syndrome 03/23/2019   Constipation 03/23/2019   Dysarthria 03/23/2019   Dysphagia 03/23/2019   Learning difficulty 03/23/2019   Morbid obesity (HCC) 03/23/2019   Muscle weakness 03/23/2019   Vitamin D deficiency 03/23/2019   Bilateral carotid artery stenosis 12/15/2018   Moderate aortic valve stenosis 12/15/2018   BMI 45.0-49.9, adult (HCC) 11/10/2018   Cerebrovascular accident (HCC) 05/02/2016   Hemiparesis affecting right side as late effect of cerebrovascular accident (HCC) 05/01/2016   Brain tumor (HCC) 11/24/2014   Gastro-esophageal reflux disease without esophagitis 11/24/2014   Accident due to mechanical fall without injury 08/03/2014   Essential hypertension 06/13/2014   Mixed hyperlipidemia 06/13/2014   Unspecified sequelae of cerebral infarction 06/13/2014   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/2014   Type 2 diabetes  mellitus without complication (HCC) 01/06/2014   Neck mass 09/08/2013   Swimmer's ear 09/08/2013   Erectile dysfunction 01/03/2013   Prediabetes 01/03/2013   ONSET DATE: 05/09/2016  REFERRING DIAG: O84.166 (ICD-10-CM) - Hemiplegia and hemiparesis following cerebral infarction affecting right dominant side   THERAPY DIAG:  Unsteadiness on feet  Difficulty in walking, not elsewhere classified  Other abnormalities of gait and mobility  Abnormality of gait and mobility  Muscle weakness (generalized)  Other lack of coordination  Left-sided weakness  Hemiplegia and hemiparesis following cerebral infarction affecting right dominant side (HCC)  Repeated falls   Rationale for Evaluation and Treatment Rehabilitation  SUBJECTIVE:  SUBJECTIVE STATEMENT  Patient reports having an "uneventful weekend." States no further falls and no pain today.     Pt accompanied by: self   PERTINENT HISTORY:   The pt is a pleasant 58 yo male who returns to PT for evaluation of deficits s/o old CVA. Pt with hx of CVA Feb 2018. He was previously fully independent, but CVA caused significant deficits, mainly affecting his right side. Pt reports continued difficulty with walking and transfers. Pt still using WC for majority of mobility, otherwise he uses his RW. Pt lives with his mom who is his main caregiver. Patient had outpatient PT previously in clinic in 2023. He reports since discharge he has been completing his HEP and walking about every day. Pt would like to improve his walking mainly, but also would like to work on balance. He also reports difficulty with transfers. Pt's mom and his caregiver helps him with ADLs. However, he says he is now waiting for a new caregiver. Prior to this pt had caregiver 3x/week. PMH  significant for hx of brain tumor, past CVA, DMII and HTN. See chart for additional information.  PAIN:  Are you having pain? Pt reports no pain currently  PRECAUTIONS: fall  WEIGHT BEARING RESTRICTIONS none  PLOF: required assistance with ADLs (old CVA), prior to CVA in 2018 was indep.  History of Falls: Pt reports no recent falls  Home information: Pt lives with mother who is his primary caregiver. Previously had home aide, 3x/week but reports currently waiting for new one.  PATIENT GOALS: Pt would like to improve his ability to ambulate.  OBJECTIVE:  taken at [facility] unless otherwise noted:   DIAGNOSTIC FINDINGS: No recent imaging per chart  Objective measures taken at eval unless otherwise specified:   Posture: forward head posture, rounded shoulders  MMT LE: grossly  4+/5, exception hip flexors and abductors 4/5 each bilat   Functional tests:  5xSTS:  34 sec, use of BUE (multiple initial attempts with posterior LOB in chair)   : 0.096 m/s with RW, close CGA, and WC follow  Gait for distance (goal 150 ft): deferred due to time  Gait mechanics: impaired, decreased speed (see ), crouched posture, poor postural stability and relies heavily on BUE support on RW. Pt now with bilat AFOS   FOTO: 44 (goal 50)    TODAY'S TREATMENT 06/03/2023    Wearing B LE custom AFOs  THERAPEUTIC ACTIVITIES:   - STSs from w/c to standing at //  bars  (1 arm on armrest and other on Rail) 2sets x 10 reps with only CGA and VC for  anterior trunk lean/weight shift, and then transitioning to hip extension with trunk extension  - Lateral side stepping in // bars dow and back focusing on wide steps with more erect posture.  Patient performed 2 trials with brief rest break- Patient rates as "Medium"   -Step tap 2 sets of 12 reps with BUE support on 6" block in // bars  -Standing Hip ext- alt LE for coordination/strength and cone positioned behind patient as feedback on how far to  swing leg back.   -Ambulation with bariatric RW- CGA with close W/c follow- Focusing on continuous motion and trying to step reciprocally for imrpoved gait quality- x 170 feet   THEREX:   Standing hip flex stretch (propping front foot onto 6" block) and lean into front leg to stretch opp side hip flex 30 sec x 3 each LE. Some difficulty attaining position but patient reported a  tough stretch and stated "I can really feel that."      PATIENT EDUCATION: Education details: Pt educated throughout session about proper posture and technique with exercises. Improved exercise technique, movement at target joints, use of target muscles after min to mod verbal, visual, tactile cues.  Person educated: Patient Education method: Explanation, Demonstration, Tactile cues, and Verbal cues Education comprehension: verbalized understanding, returned demonstration, and verbal cues required  HOME EXERCISE PROGRAM:  Standing hip flexor stretch at counter, prolonged standing with erect posture for gluteal activation, HS curls with RTB   Added the below HEP on 05/15/23  Access Code: [email or web address] URL: [email or web address] Date: 05/15/2023 Prepared by: Casimiro Needle  Exercises - Seated Long Arc Quad with Ankle Weight  - 1 x daily - 7 x weekly - 2 sets - 10 reps   GOALS: Goals reviewed with patient? No  SHORT TERM GOALS: Target date: 11/12/2022  Pt will be independent with HEP in order to improve strength and balance in order to decrease fall risk and improve function at home. Baseline: instructed in HEP today, to provide printout next visit Goal status: MET    LONG TERM GOALS: Target date: 06/12/2023  Pt will increase FOTO to at least 50 in order to demonstrate improvement in function related to mobility and QoL. Baseline: 44 7/29:48 9/10:40 12/24/2022= 51 Goal status: MET  2.  Pt (< 60 yrs old) will complete 5xSTS test < 30 seconds indicating an increase in LE strength and improved  balance. Baseline: 10/01/22 34 sec (multiple initial attempts with posterior LOB in chair) 12/20: 49 seconds 11/04/22:1:06, min A on multiple attempts to maintain balance and pt stays in flexed posture throughout test when coming to standing.   9/10: 27.5 sec, Pushes WC post consistently, close CGA  10/22: 47 seconds first attempt 12/5: 1 minute, 37 seconds, multiple attempts to get first stand, but no difficulty after that; 03/20/2023= 1 min 2 sec with 1UE on walker and 1 on armrest- some posterior lean- Close CGA Goal status: ONGOING  3.  Pt will increase to > 0.5 m/s (30 sec) as to improve gait speed for better home ambulation and reduce fall risk. Baseline: 10/01/22 0.096 m/s with RW, close CGA, and WC follow previously 12/27: 0.11 m/s 11/04/22: .056 m/s  9/10: 076 m/s 10/22: 0.64 m/s  11/26: .076 m/s; 03/20/2023= 0.08 m/s; 04/22/2023= 0.089 m/s; 05/15/2023 = 0.079 m/s; 05/27/2023= 0.11 m/s Goal status: PROGRESSING  4.  Pt will increase hip flexor and abductor  gross strength to 4+/5 bilat as to improve functional strength for independent gait, increased standing tolerance, and increased ADL ability. Baseline: 10/01/22 4+/5, exception hip flexors and abductors 4/5 each bilat  (previously 03/27/22: 4+/5 ) 9/10: Hip flexor 4/5, hip abductor 4+/5 10/22: hip flexor 4/5 hip abductor 4+/5 11/26: hip flexor 4+/5 hip abductor 4+/5 Goal status: MET  5.  Pt will ambulate 150 ft with BRW and WC follow with no rest breaks in less than 12 min to allow for improved mobility within home and increased independence. Baseline: 10/03/22 150' with BRW 04/03/22: 105 ft with BRW 7/29: 100 in 9:30 with pediatric RW 9/10:101 ft with BRW stooped posture worsened throughout test 12/24/2022- 150 feet using BRW, CGA with w/c follow- in 10 min 17 sec. Will keep goal to make sure he can do this consistently 10/22: 119 ft with BRW 8 min 23 seconds terminated by patient  11/26: 148 ft with BRW 14 min 34 seconds terminated  by patient 03/20/2023= 152 feet  using BRW, CGA and w/c follow in 14 min 3 sec- Need to keep goal active to ensure consistency but will revised and add a time element to goal. 04/22/2023= 162 feet in 11 min 54 sec using bariatric RW, CGA and close CGA assist; 05/27/2023- 170 feet without rest break using bariatric RW.  Goal status: Progressing and will keep goal active to ensure consistency.     ASSESSMENT :  CLINICAL IMPRESSION: Patient presents with good overall effort today rating most activities as Medium to hard. He continues to require VC for erect posture with standing activities and presents with very tight hip flexors. He was able to stretch them well overall  today and later presented with improved ability to perform hip extension in // bars. Patient will benefit from continued skilled physical therapy to further progress his strength and balance to decrease fall risk, improve QOL, and attain therapy goals.    OBJECTIVE IMPAIRMENTS Abnormal gait, decreased activity tolerance, decreased balance, decreased coordination, decreased endurance, decreased mobility, difficulty walking, decreased ROM, decreased strength, improper body mechanics, postural dysfunction, and obesity.   ACTIVITY LIMITATIONS carrying, lifting, bending, sitting, standing, squatting, stairs, transfers, bed mobility, continence, bathing, toileting, dressing, hygiene/grooming, and locomotion level  PARTICIPATION LIMITATIONS: meal prep, cleaning, laundry, interpersonal relationship, driving, shopping, community activity, and yard work  PERSONAL FACTORS Fitness, Past/current experiences, Time since onset of injury/illness/exacerbation, and 3+ comorbidities: hx of brain tumor, past CVA, HTN, obesity, learning disability, DMII  are also affecting patient's functional outcome.   REHAB POTENTIAL: Fair    CLINICAL DECISION MAKING: Stable/uncomplicated  EVALUATION COMPLEXITY: Low  PLAN: PT FREQUENCY: 1-2x/week  PT DURATION:  12 weeks  PLANNED INTERVENTIONS: Therapeutic exercises, Therapeutic activity, Neuromuscular re-education, Balance training, Gait training, Patient/Family education, Joint mobilization, Stair training, Vestibular training, Orthotic/Fit training, DME instructions, Dry Needling, Electrical stimulation, Wheelchair mobility training, Cryotherapy, Moist heat, Compression bandaging, Manual therapy, and Re-evaluation  PLAN FOR NEXT SESSION: Continue having patient stand targeting hip flexor stretch with good posture during rest breaks. Continue with functional strengthening exercises of the LE's and gait training using RW.     Lenda Kelp, PT Physical Therapist - [facility]  [facility]  2:23 PM 06/03/23

## 2023-06-05 ENCOUNTER — Ambulatory Visit: Payer: 59

## 2023-06-05 DIAGNOSIS — I69351 Hemiplegia and hemiparesis following cerebral infarction affecting right dominant side: Secondary | ICD-10-CM

## 2023-06-05 DIAGNOSIS — R296 Repeated falls: Secondary | ICD-10-CM

## 2023-06-05 DIAGNOSIS — R2689 Other abnormalities of gait and mobility: Secondary | ICD-10-CM

## 2023-06-05 DIAGNOSIS — R278 Other lack of coordination: Secondary | ICD-10-CM

## 2023-06-05 DIAGNOSIS — R2681 Unsteadiness on feet: Secondary | ICD-10-CM | POA: Diagnosis not present

## 2023-06-05 DIAGNOSIS — R269 Unspecified abnormalities of gait and mobility: Secondary | ICD-10-CM

## 2023-06-05 DIAGNOSIS — R262 Difficulty in walking, not elsewhere classified: Secondary | ICD-10-CM

## 2023-06-05 DIAGNOSIS — M6281 Muscle weakness (generalized): Secondary | ICD-10-CM

## 2023-06-05 DIAGNOSIS — R531 Weakness: Secondary | ICD-10-CM

## 2023-06-05 NOTE — Therapy (Signed)
 OUTPATIENT PHYSICAL THERAPY NEURO TREATMENT  Patient Name: Curtis Powell MRN: [id number] DOB:1965-05-02, 58 y.o., male Today's Date: 06/05/2023  PCP: Enid Baas, MD  REFERRING PROVIDER: Enid Baas, MD    PT End of Session - 06/05/23 1340     Visit Number 62    Number of Visits 67    Date for PT Re-Evaluation 06/12/23    Progress Note Due on Visit 70    PT Start Time 1330    PT Stop Time 1359    PT Time Calculation (min) 29 min    Equipment Utilized During Treatment Gait belt    Activity Tolerance Patient tolerated treatment well    Behavior During Therapy WFL for tasks assessed/performed                  Past Medical History:  Diagnosis Date   Asthma    GERD (gastroesophageal reflux disease)    Hyperlipidemia    Hypertension    Obesity    Stroke [facility])    Past Surgical History:  Procedure Laterality Date   BRAIN SURGERY     Patient Active Problem List   Diagnosis Date Noted   Impaired glucose tolerance 03/23/2020   Learning disability 03/23/2020   Urinary and fecal incontinence 06/14/2019   Chronic pain syndrome 03/23/2019   Constipation 03/23/2019   Dysarthria 03/23/2019   Dysphagia 03/23/2019   Learning difficulty 03/23/2019   Morbid obesity (HCC) 03/23/2019   Muscle weakness 03/23/2019   Vitamin D deficiency 03/23/2019   Bilateral carotid artery stenosis 12/15/2018   Moderate aortic valve stenosis 12/15/2018   BMI 45.0-49.9, adult (HCC) 11/10/2018   Cerebrovascular accident (HCC) 05/02/2016   Hemiparesis affecting right side as late effect of cerebrovascular accident (HCC) 05/01/2016   Brain tumor (HCC) 11/24/2014   Gastro-esophageal reflux disease without esophagitis 11/24/2014   Accident due to mechanical fall without injury 08/03/2014   Essential hypertension 06/13/2014   Mixed hyperlipidemia 06/13/2014   Unspecified sequelae of cerebral infarction 06/13/2014   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/2014   Type 2 diabetes mellitus without  complication (HCC) 01/06/2014   Neck mass 09/08/2013   Swimmer's ear 09/08/2013   Erectile dysfunction 01/03/2013   Prediabetes 01/03/2013   ONSET DATE: 05/09/2016  REFERRING DIAG: W09.811 (ICD-10-CM) - Hemiplegia and hemiparesis following cerebral infarction affecting right dominant side   THERAPY DIAG:  Unsteadiness on feet  Difficulty in walking, not elsewhere classified  Other abnormalities of gait and mobility  Abnormality of gait and mobility  Muscle weakness (generalized)  Other lack of coordination  Left-sided weakness  Hemiplegia and hemiparesis following cerebral infarction affecting right dominant side (HCC)  Repeated falls   Rationale for Evaluation and Treatment Rehabilitation  SUBJECTIVE:  SUBJECTIVE STATEMENT  Patient reports sorry for being late- States transportation was running behind.       Pt accompanied by: self   PERTINENT HISTORY:   The pt is a pleasant 58 yo male who returns to PT for evaluation of deficits s/o old CVA. Pt with hx of CVA Feb 2018. He was previously fully independent, but CVA caused significant deficits, mainly affecting his right side. Pt reports continued difficulty with walking and transfers. Pt still using WC for majority of mobility, otherwise he uses his RW. Pt lives with his mom who is his main caregiver. Patient had outpatient PT previously in clinic in 2023. He reports since discharge he has been completing his HEP and walking about every day. Pt would like to improve his walking mainly, but also would like to work on balance. He also reports difficulty with transfers. Pt's mom and his caregiver helps him with ADLs. However, he says he is now waiting for a new caregiver. Prior to this pt had caregiver 3x/week. PMH significant for hx of brain  tumor, past CVA, DMII and HTN. See chart for additional information.  PAIN:  Are you having pain? Pt reports no pain currently  PRECAUTIONS: fall  WEIGHT BEARING RESTRICTIONS none  PLOF: required assistance with ADLs (old CVA), prior to CVA in 2018 was indep.  History of Falls: Pt reports no recent falls  Home information: Pt lives with mother who is his primary caregiver. Previously had home aide, 3x/week but reports currently waiting for new one.  PATIENT GOALS: Pt would like to improve his ability to ambulate.  OBJECTIVE:  taken at [facility] unless otherwise noted:   DIAGNOSTIC FINDINGS: No recent imaging per chart  Objective measures taken at eval unless otherwise specified:   Posture: forward head posture, rounded shoulders  MMT LE: grossly  4+/5, exception hip flexors and abductors 4/5 each bilat   Functional tests:  5xSTS:  34 sec, use of BUE (multiple initial attempts with posterior LOB in chair)   : 0.096 m/s with RW, close CGA, and WC follow  Gait for distance (goal 150 ft): deferred due to time  Gait mechanics: impaired, decreased speed (see ), crouched posture, poor postural stability and relies heavily on BUE support on RW. Pt now with bilat AFOS   FOTO: 44 (goal 50)    TODAY'S TREATMENT 06/05/2023    Wearing B LE custom AFOs  THERAPEUTIC ACTIVITIES:   - Walking in // bars with 2.5# AW- forward to end of // bars then bwd back to start x 3 trips without a rest break (increased time to complete with increased Verbal cues to increase step length with retro steps)   - Lateral side stepping with 2.5 AW in // bars dow and back focusing on wide steps with more erect posture.  Patient performed down and back x 2 without rest break- Patient rates as "Tiring"    -Standing Hip ext with 2.5 # AW- alt LE for coordination/strength and cone positioned behind patient as feedback on how far to swing leg back.       PATIENT EDUCATION: Education details: Pt  educated throughout session about proper posture and technique with exercises. Improved exercise technique, movement at target joints, use of target muscles after min to mod verbal, visual, tactile cues.  Person educated: Patient Education method: Explanation, Demonstration, Tactile cues, and Verbal cues Education comprehension: verbalized understanding, returned demonstration, and verbal cues required  HOME EXERCISE PROGRAM:  Standing hip flexor stretch at counter, prolonged standing  with erect posture for gluteal activation, HS curls with RTB   Added the below HEP on 05/15/23  Access Code: [email or web address] URL: [email or web address] Date: 05/15/2023 Prepared by: Casimiro Needle  Exercises - Seated Long Arc Quad with Ankle Weight  - 1 x daily - 7 x weekly - 2 sets - 10 reps   GOALS: Goals reviewed with patient? No  SHORT TERM GOALS: Target date: 11/12/2022  Pt will be independent with HEP in order to improve strength and balance in order to decrease fall risk and improve function at home. Baseline: instructed in HEP today, to provide printout next visit Goal status: MET    LONG TERM GOALS: Target date: 06/12/2023  Pt will increase FOTO to at least 50 in order to demonstrate improvement in function related to mobility and QoL. Baseline: 44 7/29:48 9/10:40 12/24/2022= 51 Goal status: MET  2.  Pt (< 60 yrs old) will complete 5xSTS test < 30 seconds indicating an increase in LE strength and improved balance. Baseline: 10/01/22 34 sec (multiple initial attempts with posterior LOB in chair) 12/20: 49 seconds 11/04/22:1:06, min A on multiple attempts to maintain balance and pt stays in flexed posture throughout test when coming to standing.   9/10: 27.5 sec, Pushes WC post consistently, close CGA  10/22: 47 seconds first attempt 12/5: 1 minute, 37 seconds, multiple attempts to get first stand, but no difficulty after that; 03/20/2023= 1 min 2 sec with 1UE on walker and 1 on armrest-  some posterior lean- Close CGA Goal status: ONGOING  3.  Pt will increase to > 0.5 m/s (30 sec) as to improve gait speed for better home ambulation and reduce fall risk. Baseline: 10/01/22 0.096 m/s with RW, close CGA, and WC follow previously 12/27: 0.11 m/s 11/04/22: .056 m/s  9/10: 076 m/s 10/22: 0.64 m/s  11/26: .076 m/s; 03/20/2023= 0.08 m/s; 04/22/2023= 0.089 m/s; 05/15/2023 = 0.079 m/s; 05/27/2023= 0.11 m/s Goal status: PROGRESSING  4.  Pt will increase hip flexor and abductor  gross strength to 4+/5 bilat as to improve functional strength for independent gait, increased standing tolerance, and increased ADL ability. Baseline: 10/01/22 4+/5, exception hip flexors and abductors 4/5 each bilat  (previously 03/27/22: 4+/5 ) 9/10: Hip flexor 4/5, hip abductor 4+/5 10/22: hip flexor 4/5 hip abductor 4+/5 11/26: hip flexor 4+/5 hip abductor 4+/5 Goal status: MET  5.  Pt will ambulate 150 ft with BRW and WC follow with no rest breaks in less than 12 min to allow for improved mobility within home and increased independence. Baseline: 10/03/22 150' with BRW 04/03/22: 105 ft with BRW 7/29: 100 in 9:30 with pediatric RW 9/10:101 ft with BRW stooped posture worsened throughout test 12/24/2022- 150 feet using BRW, CGA with w/c follow- in 10 min 17 sec. Will keep goal to make sure he can do this consistently 10/22: 119 ft with BRW 8 min 23 seconds terminated by patient  11/26: 148 ft with BRW 14 min 34 seconds terminated by patient 03/20/2023= 152 feet using BRW, CGA and w/c follow in 14 min 3 sec- Need to keep goal active to ensure consistency but will revised and add a time element to goal. 04/22/2023= 162 feet in 11 min 54 sec using bariatric RW, CGA and close CGA assist; 05/27/2023- 170 feet without rest break using bariatric RW.  Goal status: Progressing and will keep goal active to ensure consistency.     ASSESSMENT :  CLINICAL IMPRESSION: Treatment limited secondary to patient arrived late  due to  transportation issues. He presented as highly motivated and able to work through activities with less rest break to try to get as much work in as possible. He was able to progress with some resistive gait - more difficulty with bwd walking with decreased hip swing but able to clear the floor with effor . Patient will benefit from continued skilled physical therapy to further progress his strength and balance to decrease fall risk, improve QOL, and attain therapy goals.    OBJECTIVE IMPAIRMENTS Abnormal gait, decreased activity tolerance, decreased balance, decreased coordination, decreased endurance, decreased mobility, difficulty walking, decreased ROM, decreased strength, improper body mechanics, postural dysfunction, and obesity.   ACTIVITY LIMITATIONS carrying, lifting, bending, sitting, standing, squatting, stairs, transfers, bed mobility, continence, bathing, toileting, dressing, hygiene/grooming, and locomotion level  PARTICIPATION LIMITATIONS: meal prep, cleaning, laundry, interpersonal relationship, driving, shopping, community activity, and yard work  PERSONAL FACTORS Fitness, Past/current experiences, Time since onset of injury/illness/exacerbation, and 3+ comorbidities: hx of brain tumor, past CVA, HTN, obesity, learning disability, DMII  are also affecting patient's functional outcome.   REHAB POTENTIAL: Fair    CLINICAL DECISION MAKING: Stable/uncomplicated  EVALUATION COMPLEXITY: Low  PLAN: PT FREQUENCY: 1-2x/week  PT DURATION: 12 weeks  PLANNED INTERVENTIONS: Therapeutic exercises, Therapeutic activity, Neuromuscular re-education, Balance training, Gait training, Patient/Family education, Joint mobilization, Stair training, Vestibular training, Orthotic/Fit training, DME instructions, Dry Needling, Electrical stimulation, Wheelchair mobility training, Cryotherapy, Moist heat, Compression bandaging, Manual therapy, and Re-evaluation  PLAN FOR NEXT SESSION: Continue  having patient stand targeting hip flexor stretch with good posture during rest breaks. Continue with functional strengthening exercises of the LE's and gait training using RW.     Lenda Kelp, PT Physical Therapist - [facility]  [facility]  4:56 PM 06/05/23

## 2023-06-10 ENCOUNTER — Other Ambulatory Visit: Payer: Self-pay

## 2023-06-10 ENCOUNTER — Emergency Department

## 2023-06-10 ENCOUNTER — Encounter: Payer: Self-pay | Admitting: *Deleted

## 2023-06-10 ENCOUNTER — Emergency Department
Admission: EM | Admit: 2023-06-10 | Discharge: 2023-06-11 | Disposition: A | Discharge status: Home / Self Care | Attending: Emergency Medicine | Admitting: Emergency Medicine

## 2023-06-10 ENCOUNTER — Ambulatory Visit: Payer: 59

## 2023-06-10 DIAGNOSIS — I10 Essential (primary) hypertension: Secondary | ICD-10-CM | POA: Insufficient documentation

## 2023-06-10 DIAGNOSIS — I6782 Cerebral ischemia: Secondary | ICD-10-CM | POA: Diagnosis not present

## 2023-06-10 DIAGNOSIS — R531 Weakness: Secondary | ICD-10-CM | POA: Diagnosis present

## 2023-06-10 DIAGNOSIS — M25461 Effusion, right knee: Secondary | ICD-10-CM | POA: Insufficient documentation

## 2023-06-10 DIAGNOSIS — E871 Hypo-osmolality and hyponatremia: Secondary | ICD-10-CM | POA: Diagnosis not present

## 2023-06-10 DIAGNOSIS — W19XXXA Unspecified fall, initial encounter: Secondary | ICD-10-CM | POA: Insufficient documentation

## 2023-06-10 DIAGNOSIS — M25561 Pain in right knee: Secondary | ICD-10-CM | POA: Insufficient documentation

## 2023-06-10 DIAGNOSIS — J45909 Unspecified asthma, uncomplicated: Secondary | ICD-10-CM | POA: Insufficient documentation

## 2023-06-10 DIAGNOSIS — I700 Atherosclerosis of aorta: Secondary | ICD-10-CM | POA: Diagnosis not present

## 2023-06-10 DIAGNOSIS — Z8673 Personal history of transient ischemic attack (TIA), and cerebral infarction without residual deficits: Secondary | ICD-10-CM | POA: Diagnosis not present

## 2023-06-10 DIAGNOSIS — R059 Cough, unspecified: Secondary | ICD-10-CM | POA: Insufficient documentation

## 2023-06-10 DIAGNOSIS — M47812 Spondylosis without myelopathy or radiculopathy, cervical region: Secondary | ICD-10-CM | POA: Diagnosis not present

## 2023-06-10 LAB — BASIC METABOLIC PANEL
Anion gap: 12 (ref 5–15)
BUN: 13 mg/dL (ref 6–20)
CO2: 22 mmol/L (ref 22–32)
Calcium: 9 mg/dL (ref 8.9–10.3)
Chloride: 98 mmol/L (ref 98–111)
Creatinine, Ser: 0.97 mg/dL (ref 0.61–1.24)
GFR, Estimated: >60 mL/min (ref >60)
Glucose, Bld: 114 mg/dL — ABNORMAL HIGH (ref 70–99)
Potassium: 3.4 mmol/L — ABNORMAL LOW (ref 3.5–5.1)
Sodium: 132 mmol/L — ABNORMAL LOW (ref 135–145)

## 2023-06-10 LAB — URINALYSIS, ROUTINE W REFLEX MICROSCOPIC
Bilirubin Urine: NEGATIVE
Glucose, UA: NEGATIVE mg/dL
Hgb urine dipstick: NEGATIVE
Ketones, ur: NEGATIVE mg/dL
Leukocytes,Ua: NEGATIVE
Nitrite: NEGATIVE
Protein, ur: NEGATIVE mg/dL
Specific Gravity, Urine: 1.014 (ref 1.005–1.030)
pH: 5 (ref 5.0–8.0)

## 2023-06-10 LAB — CBC
HCT: 42.8 % (ref 39.0–52.0)
Hemoglobin: 13.7 g/dL (ref 13.0–17.0)
MCH: 26.7 pg (ref 26.0–34.0)
MCHC: 32 g/dL (ref 30.0–36.0)
MCV: 83.4 fL (ref 80.0–100.0)
Platelets: 176 10*3/uL (ref 150–400)
RBC: 5.13 MIL/uL (ref 4.22–5.81)
RDW: 15.9 % — ABNORMAL HIGH (ref 11.5–15.5)
WBC: 7.3 10*3/uL (ref 4.0–10.5)
nRBC: 0 % (ref 0.0–0.2)

## 2023-06-10 LAB — RESP PANEL BY RT-PCR (RSV, FLU A&B, COVID)  RVPGX2
Influenza A by PCR: NEGATIVE
Influenza B by PCR: NEGATIVE
Resp Syncytial Virus by PCR: NEGATIVE
SARS Coronavirus 2 by RT PCR: NEGATIVE

## 2023-06-10 LAB — TROPONIN I (HIGH SENSITIVITY)
Troponin I (High Sensitivity): 4 ng/L (ref <18)
Troponin I (High Sensitivity): 5 ng/L (ref <18)

## 2023-06-10 LAB — PROTIME-INR
INR: 1 (ref 0.8–1.2)
Prothrombin Time: 13.5 s (ref 11.4–15.2)

## 2023-06-10 MED ORDER — LACTATED RINGERS IV BOLUS
1000.0000 mL | Freq: Once | INTRAVENOUS | Status: AC
  Administered 2023-06-10: 1000 mL via INTRAVENOUS

## 2023-06-10 NOTE — ED Provider Notes (Signed)
-----------------------------------------   11:36 PM on 06/10/2023 -----------------------------------------  Assuming care from Dr. Jodie Echevaria.  In short, Curtis Powell is a 58 y.o. male with a chief complaint of weakness.  Refer to the original H&P for additional details.  The current plan of care is to follow up MRI and reassess.  Anticipate discharge home if MRI is normal.   Clinical Course as of 06/11/23 0210  Tue Jun 10, 2023  2139 DG Chest 2 View No active cardiopulmonary disease.  [TT]  2159 Independent review of labs, UA not consistent with UTI, troponin is not elevated, he has mild hyponatremia but rest of electrolytes not severely deranged, no leukocytosis, respiratory viral panel is negative. [TT]  2226 CT Head Wo Contrast IMPRESSION: 1. No acute intracranial abnormality. 2. Age advanced generalized cerebral atrophy with chronic white matter small vessel ischemic changes. 3. Stable calcifications involving predominantly the cerebellum and bilateral occipital lobes. 4. Marked severity left-sided sphenoid sinus disease. Enlargement of the mucous retention cyst seen within this region on the prior study cannot be excluded.   [TT]  2226 DG Knee Complete 4 Views Right IMPRESSION: Moderate sized joint effusion without evidence of an acute osseous abnormality.   [TT]  Wed Jun 11, 2023  0208 MR BRAIN WO CONTRAST I viewed and interpreted the patient's MRI and I see no evidence of an acute stroke.  Radiology confirms no acute abnormalities.  I reassessed the patient, he was drinking water and in no apparent distress.  I updated him and his family member regarding the overall reassuring workup.  I explained that we do not have a specific reason for the apparent worsening of his weakness but fortunately there is no indication of an emergent medical condition requiring hospitalization.  They understand and will follow-up with his regular doctor at the next available opportunity.  I gave  my usual and customary return precautions. [CF]    Clinical Course User Index [CF] Loleta Rose, MD [TT] Claybon Jabs, MD     Medications  lactated ringers bolus 1,000 mL (0 mLs Intravenous Stopped 06/10/23 2335)     ED Discharge Orders     None      Final diagnoses:  Weakness  Fall, initial encounter  Acute pain of right knee     Loleta Rose, MD 06/11/23 0210

## 2023-06-10 NOTE — ED Triage Notes (Signed)
 First nurse note: Arrived by [facility] From home for fall. Denies LOC or hitting head. C/o left knee pain. A&O x4 upon arrival  Generalized weakness started yesterday. Unable to put weight on legs starting yesterday. Cold symptoms started yesterday.  EMS vitals: 139/77 b/p 110HR 96% RA 109CBG 98.6oral

## 2023-06-10 NOTE — ED Triage Notes (Addendum)
 Pt brought in via ems from home with weakness since yesterday.  Pt reports weak in the knees. Sx began 6pm yesterday.    No chest pain or sob.  No dizziness.  No n/v/d.  Pt alert  speech clear.

## 2023-06-10 NOTE — ED Notes (Signed)
 Patient transported to CT

## 2023-06-10 NOTE — ED Provider Notes (Signed)
 [facility] Provider Note    Event Date/Time   First MD Initiated Contact with Patient 06/10/23 2007     (approximate)   History   Weakness   HPI  Curtis Powell is a 58 y.o. male history of asthma, GERD, hyperlipidemia, hypertension, prior stroke, presenting with weakness.  Per mom, patient has been having right lower extremity weakness since yesterday.  States that his legs gave out when he was walking with his walker.  He does have a cough.  He denies any numbness, chest pain, shortness of breath, urinary symptoms, abdominal pain, nausea, vomiting, diarrhea.  Per mom, he has had prior strokes, had right-sided weakness from the stroke but feels like his right lower extremity is weaker than before.  Per EMS, he was complaining about right knee pain.  States he did not hit his head or lose consciousness from the fall.  Independent history obtained from mom and EMS.  On independent chart review he was seen by his primary care doctor in August, it is reported that he had right sided weakness secondary to stroke.     Physical Exam   Triage Vital Signs: ED Triage Vitals [06/10/23 1803]  Encounter Vitals Group     BP 118/63     Systolic BP Percentile      Diastolic BP Percentile      Pulse Rate (!) 108     Resp (!) 22     Temp 98.7 F (37.1 C)     Temp Source Oral     SpO2 95 %     Weight 240 lb (108.9 kg)     Height 4\' 11"  (1.499 m)     Head Circumference      Peak Flow      Pain Score 0     Pain Loc      Pain Education      Exclude from Growth Chart     Most recent vital signs: Vitals:   06/10/23 2100 06/10/23 2148  BP: 128/62   Pulse: 95   Resp: 20   Temp:  99.2 F (37.3 C)  SpO2: 93%      General: Awake, no distress.  CV:  Good peripheral perfusion.  Resp:  Normal effort.  Abd:  No distention.  Soft nontender Other:  Pupils are equal and reactive, extraocular movements are intact, no weakness to the upper extremities, no sensory  deficits.  He does have weakness to his right lower extremity compared to the left.  No sensory deficits.  No thoracic cage tenderness, no palpable skull deformities or tenderness, no midline spinal tenderness, no saddle anesthesia.  He has mild tenderness to the right knee but otherwise rest of extremities without tenderness or deformities.  Able to range all his extremities.   ED Results / Procedures / Treatments   Labs (all labs ordered are listed, but only abnormal results are displayed) Labs Reviewed  BASIC METABOLIC PANEL - Abnormal; Notable for the following components:      Result Value   Sodium 132 (*)    Potassium 3.4 (*)    Glucose, Bld 114 (*)    All other components within normal limits  CBC - Abnormal; Notable for the following components:   RDW 15.9 (*)    All other components within normal limits  URINALYSIS, ROUTINE W REFLEX MICROSCOPIC - Abnormal; Notable for the following components:   Color, Urine YELLOW (*)    APPearance CLEAR (*)    All  other components within normal limits  RESP PANEL BY RT-PCR (RSV, FLU A&B, COVID)  [id number]  PROTIME-INR  TROPONIN I (HIGH SENSITIVITY)  TROPONIN I (HIGH SENSITIVITY)     EKG  EKG shows sinus tachycardia, rate 106, normal QRS, normal QTc, no ischemic ST elevation, T wave inversion to 3, T wave version is new compared to prior   RADIOLOGY CT head on my interpretation without obvious intracranial hemorrhage   PROCEDURES:  Critical Care performed: No  Procedures   MEDICATIONS ORDERED IN ED: Medications  lactated ringers bolus 1,000 mL (0 mLs Intravenous Stopped 06/10/23 2335)     IMPRESSION / MDM / ASSESSMENT AND PLAN / ED COURSE  I reviewed the triage vital signs and the nursing notes.                              Differential diagnosis includes, but is not limited to, deconditioning, new CVA, electrolyte derangements, dehydration, UTI, pneumonia, viral illness, COVID, influenza, RSV, atypical ACS.  He is outside  the tPA window given that his symptoms started yesterday.  Stroke alert was not activated.  Labs, EKG, troponin, chest x-ray, UA, CT head and neck were obtained out of triage.  Will add his chest x-ray.  Will given some fluids.  Will also get an MRI.  Patient's presentation is most consistent with acute presentation with potential threat to life or bodily function.  Independent review of labs and imaging are below.  Patient signed out to overnight team pending MRI results.  If negative, likely discharge home.  Clinical Course as of 06/10/23 2341  Tue Jun 10, 2023  2139 DG Chest 2 View No active cardiopulmonary disease.  [TT]  2159 Independent review of labs, UA not consistent with UTI, troponin is not elevated, he has mild hyponatremia but rest of electrolytes not severely deranged, no leukocytosis, respiratory viral panel is negative. [TT]  2226 CT Head Wo Contrast IMPRESSION: 1. No acute intracranial abnormality. 2. Age advanced generalized cerebral atrophy with chronic white matter small vessel ischemic changes. 3. Stable calcifications involving predominantly the cerebellum and bilateral occipital lobes. 4. Marked severity left-sided sphenoid sinus disease. Enlargement of the mucous retention cyst seen within this region on the prior study cannot be excluded.   [TT]  2226 DG Knee Complete 4 Views Right IMPRESSION: Moderate sized joint effusion without evidence of an acute osseous abnormality.   [TT]    Clinical Course User Index [TT] Jodie Echevaria Franchot Erichsen, MD     FINAL CLINICAL IMPRESSION(S) / ED DIAGNOSES   Final diagnoses:  Weakness  Fall, initial encounter  Acute pain of right knee     Rx / DC Orders   ED Discharge Orders     None        Note:  This document was prepared using Dragon voice recognition software and may include unintentional dictation errors.    Claybon Jabs, MD 06/10/23 [id number]

## 2023-06-11 NOTE — Discharge Instructions (Signed)
 Your workup in the Emergency Department today was reassuring.  We did not find any specific abnormalities.  We recommend you drink plenty of fluids, take your regular medications and/or any new ones prescribed today, and follow up with the doctor(s) listed in these documents as recommended.  Return to the Emergency Department if you develop new or worsening symptoms that concern you.

## 2023-06-12 ENCOUNTER — Ambulatory Visit: Payer: 59

## 2023-06-17 ENCOUNTER — Ambulatory Visit: Payer: 59 | Attending: Internal Medicine

## 2023-06-17 DIAGNOSIS — R2681 Unsteadiness on feet: Secondary | ICD-10-CM | POA: Diagnosis present

## 2023-06-17 DIAGNOSIS — M6281 Muscle weakness (generalized): Secondary | ICD-10-CM | POA: Insufficient documentation

## 2023-06-17 DIAGNOSIS — R531 Weakness: Secondary | ICD-10-CM | POA: Diagnosis present

## 2023-06-17 DIAGNOSIS — R2689 Other abnormalities of gait and mobility: Secondary | ICD-10-CM | POA: Diagnosis present

## 2023-06-17 DIAGNOSIS — R262 Difficulty in walking, not elsewhere classified: Secondary | ICD-10-CM | POA: Diagnosis present

## 2023-06-17 DIAGNOSIS — R278 Other lack of coordination: Secondary | ICD-10-CM | POA: Insufficient documentation

## 2023-06-17 DIAGNOSIS — R269 Unspecified abnormalities of gait and mobility: Secondary | ICD-10-CM | POA: Diagnosis present

## 2023-06-17 DIAGNOSIS — R296 Repeated falls: Secondary | ICD-10-CM | POA: Insufficient documentation

## 2023-06-17 NOTE — Therapy (Incomplete)
 OUTPATIENT PHYSICAL THERAPY NEURO TREATMENT/RECERT  Patient Name: THERRON SELLS MRN: [id number] DOB:1966/01/13, 58 y.o., male Today's Date: 06/18/2023  PCP: Enid Baas, MD  REFERRING PROVIDER: Enid Baas, MD    PT End of Session - 06/17/23 1140     Visit Number 63    Number of Visits 87    Date for PT Re-Evaluation 09/09/23    Progress Note Due on Visit 70    PT Start Time 1140    PT Stop Time 1229    PT Time Calculation (min) 49 min    Equipment Utilized During Treatment Gait belt    Activity Tolerance Patient tolerated treatment well    Behavior During Therapy WFL for tasks assessed/performed                  Past Medical History:  Diagnosis Date   Asthma    GERD (gastroesophageal reflux disease)    Hyperlipidemia    Hypertension    Obesity    Stroke [facility])    Past Surgical History:  Procedure Laterality Date   BRAIN SURGERY     Patient Active Problem List   Diagnosis Date Noted   Impaired glucose tolerance 03/23/2020   Learning disability 03/23/2020   Urinary and fecal incontinence 06/14/2019   Chronic pain syndrome 03/23/2019   Constipation 03/23/2019   Dysarthria 03/23/2019   Dysphagia 03/23/2019   Learning difficulty 03/23/2019   Morbid obesity (HCC) 03/23/2019   Muscle weakness 03/23/2019   Vitamin D deficiency 03/23/2019   Bilateral carotid artery stenosis 12/15/2018   Moderate aortic valve stenosis 12/15/2018   BMI 45.0-49.9, adult (HCC) 11/10/2018   Cerebrovascular accident (HCC) 05/02/2016   Hemiparesis affecting right side as late effect of cerebrovascular accident (HCC) 05/01/2016   Brain tumor (HCC) 11/24/2014   Gastro-esophageal reflux disease without esophagitis 11/24/2014   Accident due to mechanical fall without injury 08/03/2014   Essential hypertension 06/13/2014   Mixed hyperlipidemia 06/13/2014   Unspecified sequelae of cerebral infarction 06/13/2014   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/2014   Type 2 diabetes mellitus  without complication (HCC) 01/06/2014   Neck mass 09/08/2013   Swimmer's ear 09/08/2013   Erectile dysfunction 01/03/2013   Prediabetes 01/03/2013   ONSET DATE: 05/09/2016  REFERRING DIAG: W09.811 (ICD-10-CM) - Hemiplegia and hemiparesis following cerebral infarction affecting right dominant side   THERAPY DIAG:  Unsteadiness on feet - Plan: PT plan of care cert/re-cert  Difficulty in walking, not elsewhere classified - Plan: PT plan of care cert/re-cert  Other abnormalities of gait and mobility - Plan: PT plan of care cert/re-cert  Abnormality of gait and mobility - Plan: PT plan of care cert/re-cert  Muscle weakness (generalized) - Plan: PT plan of care cert/re-cert  Other lack of coordination - Plan: PT plan of care cert/re-cert  Left-sided weakness - Plan: PT plan of care cert/re-cert   Rationale for Evaluation and Treatment Rehabilitation  SUBJECTIVE:  SUBJECTIVE STATEMENT  Patient reports he was under the weather some last week. States feeling much better this week.       Pt accompanied by: self   PERTINENT HISTORY:   The pt is a pleasant 58 yo male who returns to PT for evaluation of deficits s/o old CVA. Pt with hx of CVA Feb 2018. He was previously fully independent, but CVA caused significant deficits, mainly affecting his right side. Pt reports continued difficulty with walking and transfers. Pt still using WC for majority of mobility, otherwise he uses his RW. Pt lives with his mom who is his main caregiver. Patient had outpatient PT previously in clinic in 2023. He reports since discharge he has been completing his HEP and walking about every day. Pt would like to improve his walking mainly, but also would like to work on balance. He also reports difficulty with transfers. Pt's mom  and his caregiver helps him with ADLs. However, he says he is now waiting for a new caregiver. Prior to this pt had caregiver 3x/week. PMH significant for hx of brain tumor, past CVA, DMII and HTN. See chart for additional information.  PAIN:  Are you having pain? Pt reports no pain currently  PRECAUTIONS: fall  WEIGHT BEARING RESTRICTIONS none  PLOF: required assistance with ADLs (old CVA), prior to CVA in 2018 was indep.  History of Falls: Pt reports no recent falls  Home information: Pt lives with mother who is his primary caregiver. Previously had home aide, 3x/week but reports currently waiting for new one.  PATIENT GOALS: Pt would like to improve his ability to ambulate.  OBJECTIVE:  taken at [facility] unless otherwise noted:   DIAGNOSTIC FINDINGS: No recent imaging per chart  Objective measures taken at eval unless otherwise specified:   Posture: forward head posture, rounded shoulders  MMT LE: grossly  4+/5, exception hip flexors and abductors 4/5 each bilat   Functional tests:  5xSTS:  34 sec, use of BUE (multiple initial attempts with posterior LOB in chair)   : 0.096 m/s with RW, close CGA, and WC follow  Gait for distance (goal 150 ft): deferred due to time  Gait mechanics: impaired, decreased speed (see ), crouched posture, poor postural stability and relies heavily on BUE support on RW. Pt now with bilat AFOS   FOTO: 44 (goal 50)    TODAY'S TREATMENT  06/17/2023   Wearing B LE custom AFOs  Physical therapy treatment session today consisted of completing assessment of goals and administration of testing as demonstrated and documented in flow sheet, treatment, and goals section of this note. Addition treatments may be found below.   Pt performed 5 time sit<>stand (5xSTS): 06/17/2023= 38 sec with 1 UE on walker and 1 on armrest (>15 sec indicates increased fall risk)    PT instructed pt in TUG:  2 min using bariatric 4WW (>13.5 sec indicates increased  fall risk)   10 Meter Walk Test: Patient instructed to walk 10 meters (32.8 ft) as quickly and as safely as possible at their normal speed x2 and at a fast speed x2. Time measured from 2 meter mark to 8 meter mark to accommodate ramp-up and ramp-down.  Normal speed 1: 0.11 m/s Normal speed 2: 0.11 m/s Average Normal speed: 0.11 m/s Cut off scores: <0.4 m/s = household Ambulator, 0.4-0.8 m/s = limited community Ambulator, >0.8 m/s = community Ambulator, >1.2 m/s = crossing a street, <1.0 = increased fall risk MCID 0.05 m/s (small), 0.13 m/s (moderate), 0.06  m/s (significant)  (ANPTA Core Set of Outcome Measures for Adults with Neurologic Conditions, 2018)     THERAPEUTIC ACTIVITIES:   - Sit to stand x 10 with 1 UE on walker and 1 UE on armrest (VC to avoid posterior lean  Self care/Home management:  -Review of current HEP- walking, sit to stand and static standing at least 3x/day while watching TV (ex. Stand up during commercial breaks) -Review of seated/standing ex.     PATIENT EDUCATION: Education details: Pt educated throughout session about proper posture and technique with exercises. Improved exercise technique, movement at target joints, use of target muscles after min to mod verbal, visual, tactile cues.  Person educated: Patient Education method: Explanation, Demonstration, Tactile cues, and Verbal cues Education comprehension: verbalized understanding, returned demonstration, and verbal cues required  HOME EXERCISE PROGRAM:  Standing hip flexor stretch at counter, prolonged standing with erect posture for gluteal activation, HS curls with RTB   Added the below HEP on 05/15/23  Access Code: [email or web address] URL: [email or web address] Date: 05/15/2023 Prepared by: Casimiro Needle  Exercises - Seated Long Arc Quad with Ankle Weight  - 1 x daily - 7 x weekly - 2 sets - 10 reps   GOALS: Goals reviewed with patient? No  SHORT TERM GOALS: Target date: 11/12/2022  Pt  will be independent with HEP in order to improve strength and balance in order to decrease fall risk and improve function at home. Baseline: instructed in HEP today, to provide printout next visit Goal status: MET    LONG TERM GOALS: Target date: 09/09/2023  Pt will increase FOTO to at least 50 in order to demonstrate improvement in function related to mobility and QoL. Baseline: 44 7/29:48 9/10:40 12/24/2022= 51 Goal status: MET  2.  Pt (< 60 yrs old) will complete 5xSTS test < 30 seconds indicating an increase in LE strength and improved balance. Baseline: 10/01/22 34 sec (multiple initial attempts with posterior LOB in chair) 12/20: 49 seconds 11/04/22:1:06, min A on multiple attempts to maintain balance and pt stays in flexed posture throughout test when coming to standing.   9/10: 27.5 sec, Pushes WC post consistently, close CGA  10/22: 47 seconds first attempt 12/5: 1 minute, 37 seconds, multiple attempts to get first stand, but no difficulty after that; 03/20/2023= 1 min 2 sec with 1UE on walker and 1 on armrest- some posterior lean- Close CGA; 06/17/2023= 38 sec with 1 UE on walker and 1 on armrest Goal status: ONGOING  3.  Pt will increase to > 0.5 m/s (30 sec) as to improve gait speed for better home ambulation and reduce fall risk. Baseline: 10/01/22 0.096 m/s with RW, close CGA, and WC follow previously 12/27: 0.11 m/s 11/04/22: .056 m/s  9/10: 076 m/s 10/22: 0.64 m/s  11/26: .076 m/s; 03/20/2023= 0.08 m/s; 04/22/2023= 0.089 m/s; 05/15/2023 = 0.079 m/s; 05/27/2023= 0.11 m/s Goal status: PROGRESSING  4.  Pt will increase hip flexor and abductor  gross strength to 4+/5 bilat as to improve functional strength for independent gait, increased standing tolerance, and increased ADL ability. Baseline: 10/01/22 4+/5, exception hip flexors and abductors 4/5 each bilat  (previously 03/27/22: 4+/5 ) 9/10: Hip flexor 4/5, hip abductor 4+/5 10/22: hip flexor 4/5 hip abductor 4+/5 11/26: hip  flexor 4+/5 hip abductor 4+/5 Goal status: MET  5.  Pt will ambulate 150 ft with BRW and WC follow with no rest breaks in less than 12 min to allow for improved mobility within home and increased independence. Baseline:  10/03/22 150' with BRW 04/03/22: 105 ft with BRW 7/29: 100 in 9:30 with pediatric RW 9/10:101 ft with BRW stooped posture worsened throughout test 12/24/2022- 150 feet using BRW, CGA with w/c follow- in 10 min 17 sec. Will keep goal to make sure he can do this consistently 10/22: 119 ft with BRW 8 min 23 seconds terminated by patient  11/26: 148 ft with BRW 14 min 34 seconds terminated by patient 03/20/2023= 152 feet using BRW, CGA and w/c follow in 14 min 3 sec- Need to keep goal active to ensure consistency but will revised and add a time element to goal. 04/22/2023= 162 feet in 11 min 54 sec using bariatric RW, CGA and close CGA assist; 05/27/2023- 170 feet without rest break using bariatric RW. 06/17/2023= 160 feet with bariatric 4WW in 9 min- stopped due to fatigue.  Goal status: MET   6. Pt will decrease TUG to below 1 min 30 seconds/decrease in order to demonstrate decreased fall risk. Baseline: 06/17/2023= 2 min using bariatric 4WW-  Goal status: NEW  7. Pt will ambulate 300 ft with BRW, CGA and WC follow with no rest breaks in less than 20 min to allow for improved mobility within home/community and increased independence. Baseline: 06/17/2023= 160 feet with bariatric 4WW in 9 min- stopped due to fatigue.  Goal Status: NEW    ASSESSMENT :  CLINICAL IMPRESSION: Patient presents with excellent motivation for today's session. He was reassessed for recert today and presents with overall improved functional strength and mobility. He demonstrated improved 5 x STS from previous testing and much improved eccentric control with sitting and better ant weight shift with less falling backward. He is now consistently walking > 150 feet so will set a new walking goal based on gains.  Patient's condition has the potential to improve in response to therapy. Maximum improvement is yet to be obtained. The anticipated improvement is attainable and reasonable in a generally predictable time.  Patient will benefit from continued skilled physical therapy to further progress his strength and balance to decrease fall risk, improve QOL, and attain therapy goals.    OBJECTIVE IMPAIRMENTS Abnormal gait, decreased activity tolerance, decreased balance, decreased coordination, decreased endurance, decreased mobility, difficulty walking, decreased ROM, decreased strength, improper body mechanics, postural dysfunction, and obesity.   ACTIVITY LIMITATIONS carrying, lifting, bending, sitting, standing, squatting, stairs, transfers, bed mobility, continence, bathing, toileting, dressing, hygiene/grooming, and locomotion level  PARTICIPATION LIMITATIONS: meal prep, cleaning, laundry, interpersonal relationship, driving, shopping, community activity, and yard work  PERSONAL FACTORS Fitness, Past/current experiences, Time since onset of injury/illness/exacerbation, and 3+ comorbidities: hx of brain tumor, past CVA, HTN, obesity, learning disability, DMII  are also affecting patient's functional outcome.   REHAB POTENTIAL: Fair    CLINICAL DECISION MAKING: Stable/uncomplicated  EVALUATION COMPLEXITY: Low  PLAN: PT FREQUENCY: 1-2x/week  PT DURATION: 12 weeks  PLANNED INTERVENTIONS: Therapeutic exercises, Therapeutic activity, Neuromuscular re-education, Balance training, Gait training, Patient/Family education, Joint mobilization, Stair training, Vestibular training, Orthotic/Fit training, DME instructions, Dry Needling, Electrical stimulation, Wheelchair mobility training, Cryotherapy, Moist heat, Compression bandaging, Manual therapy, and Re-evaluation  PLAN FOR NEXT SESSION:  Continue with functional strengthening exercises of the LE's and gait training using RW. Incorporate good technique  with all transfers to decrease his risk of falling.     Lenda Kelp, PT Physical Therapist - [place]  [facility]  7:49 AM 06/18/23

## 2023-06-19 ENCOUNTER — Ambulatory Visit: Payer: 59 | Admitting: Physical Therapy

## 2023-06-19 DIAGNOSIS — R278 Other lack of coordination: Secondary | ICD-10-CM

## 2023-06-19 DIAGNOSIS — R296 Repeated falls: Secondary | ICD-10-CM

## 2023-06-19 DIAGNOSIS — R269 Unspecified abnormalities of gait and mobility: Secondary | ICD-10-CM

## 2023-06-19 DIAGNOSIS — M6281 Muscle weakness (generalized): Secondary | ICD-10-CM

## 2023-06-19 DIAGNOSIS — R262 Difficulty in walking, not elsewhere classified: Secondary | ICD-10-CM

## 2023-06-19 DIAGNOSIS — R2681 Unsteadiness on feet: Secondary | ICD-10-CM | POA: Diagnosis not present

## 2023-06-19 DIAGNOSIS — R2689 Other abnormalities of gait and mobility: Secondary | ICD-10-CM

## 2023-06-19 DIAGNOSIS — R531 Weakness: Secondary | ICD-10-CM

## 2023-06-19 NOTE — Therapy (Signed)
 OUTPATIENT PHYSICAL THERAPY NEURO TREATMENT/RECERT  Patient Name: Curtis Powell MRN: [id number] DOB:05/31/1965, 58 y.o., male Today's Date: 06/19/2023  PCP: Enid Baas, MD  REFERRING PROVIDER: Enid Baas, MD    PT End of Session - 06/19/23 1316     Visit Number 64    Number of Visits 87    Date for PT Re-Evaluation 09/09/23    Progress Note Due on Visit 70    PT Start Time 1315    PT Stop Time 1355    PT Time Calculation (min) 40 min    Equipment Utilized During Treatment Gait belt    Activity Tolerance Patient tolerated treatment well    Behavior During Therapy WFL for tasks assessed/performed                  Past Medical History:  Diagnosis Date   Asthma    GERD (gastroesophageal reflux disease)    Hyperlipidemia    Hypertension    Obesity    Stroke [facility])    Past Surgical History:  Procedure Laterality Date   BRAIN SURGERY     Patient Active Problem List   Diagnosis Date Noted   Impaired glucose tolerance 03/23/2020   Learning disability 03/23/2020   Urinary and fecal incontinence 06/14/2019   Chronic pain syndrome 03/23/2019   Constipation 03/23/2019   Dysarthria 03/23/2019   Dysphagia 03/23/2019   Learning difficulty 03/23/2019   Morbid obesity (HCC) 03/23/2019   Muscle weakness 03/23/2019   Vitamin D deficiency 03/23/2019   Bilateral carotid artery stenosis 12/15/2018   Moderate aortic valve stenosis 12/15/2018   BMI 45.0-49.9, adult (HCC) 11/10/2018   Cerebrovascular accident (HCC) 05/02/2016   Hemiparesis affecting right side as late effect of cerebrovascular accident (HCC) 05/01/2016   Brain tumor (HCC) 11/24/2014   Gastro-esophageal reflux disease without esophagitis 11/24/2014   Accident due to mechanical fall without injury 08/03/2014   Essential hypertension 06/13/2014   Mixed hyperlipidemia 06/13/2014   Unspecified sequelae of cerebral infarction 06/13/2014   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/2014   Type 2 diabetes mellitus  without complication (HCC) 01/06/2014   Neck mass 09/08/2013   Swimmer's ear 09/08/2013   Erectile dysfunction 01/03/2013   Prediabetes 01/03/2013   ONSET DATE: 05/09/2016  REFERRING DIAG: W09.811 (ICD-10-CM) - Hemiplegia and hemiparesis following cerebral infarction affecting right dominant side   THERAPY DIAG:  Unsteadiness on feet  Difficulty in walking, not elsewhere classified  Other abnormalities of gait and mobility  Abnormality of gait and mobility  Muscle weakness (generalized)  Other lack of coordination  Left-sided weakness  Repeated falls   Rationale for Evaluation and Treatment Rehabilitation  SUBJECTIVE:  SUBJECTIVE STATEMENT  Patient reports that he is doing okay. Noted to have a little cough, but reports that it has improved since the beginning of the week. No pain at the start of PT treatment.  States that he was able to walk a little at home reports from the front door to the couch roughly 10-15 ft. With walker.   Pt accompanied by: self   PERTINENT HISTORY:   The pt is a pleasant 58 yo male who returns to PT for evaluation of deficits s/o old CVA. Pt with hx of CVA Feb 2018. He was previously fully independent, but CVA caused significant deficits, mainly affecting his right side. Pt reports continued difficulty with walking and transfers. Pt still using WC for majority of mobility, otherwise he uses his RW. Pt lives with his mom who is his main caregiver. Patient had outpatient PT previously in clinic in 2023. He reports since discharge he has been completing his HEP and walking about every day. Pt would like to improve his walking mainly, but also would like to work on balance. He also reports difficulty with transfers. Pt's mom and his caregiver helps him with ADLs.  However, he says he is now waiting for a new caregiver. Prior to this pt had caregiver 3x/week. PMH significant for hx of brain tumor, past CVA, DMII and HTN. See chart for additional information.  PAIN:  Are you having pain? Pt reports no pain currently  PRECAUTIONS: fall  WEIGHT BEARING RESTRICTIONS none  PLOF: required assistance with ADLs (old CVA), prior to CVA in 2018 was indep.  History of Falls: Pt reports no recent falls  Home information: Pt lives with mother who is his primary caregiver. Previously had home aide, 3x/week but reports currently waiting for new one.  PATIENT GOALS: Pt would like to improve his ability to ambulate.  OBJECTIVE:  taken at [facility] unless otherwise noted:   DIAGNOSTIC FINDINGS: No recent imaging per chart  Objective measures taken at eval unless otherwise specified:   Posture: forward head posture, rounded shoulders  MMT LE: grossly  4+/5, exception hip flexors and abductors 4/5 each bilat   Functional tests:  5xSTS:  34 sec, use of BUE (multiple initial attempts with posterior LOB in chair)   : 0.096 m/s with RW, close CGA, and WC follow  Gait for distance (goal 150 ft): deferred due to time  Gait mechanics: impaired, decreased speed (see ), crouched posture, poor postural stability and relies heavily on BUE support on RW. Pt now with bilat AFOS   FOTO: 44 (goal 50)    TODAY'S TREATMENT  06/19/2023  Wearing B LE custom AFOs. PT applied gait belt at start of session, CGA provided throughout session, unless otherwise stated for improved safety   Sit<>stand in parallel bars wit supervision assist from PT with heavy use of BUE support on rails.   Standing in parallel bars.  Alternating single UE support with 5 sec hold bil x 4  Standing with UE support on PT forearms and min-mod assist to prevent posterior LOB 2 x 30 sec  1 UE support on PT x 15 sec bil lateral lean x 5 bil with 1 UE support x 2  Forward reverse gait in  parallel bars 45ft x 2 x 3 bouts  Side stepping with BUE support on 1 rail 48ft x 2, x 2 bouts   Sit<>stand with 1 UE supported on arm rest, 1 on parallel bar 2 x 5.   Assist from PT for  improved weight shift r and L as well as full erect posture to reduce posterior bias.  Pt transported to waiting room in [facility] at end of treatment.   PATIENT EDUCATION: Education details: Pt educated throughout session about proper posture and technique with exercises. Improved exercise technique, movement at target joints, use of target muscles after min to mod verbal, visual, tactile cues.  Person educated: Patient Education method: Explanation, Demonstration, Tactile cues, and Verbal cues Education comprehension: verbalized understanding, returned demonstration, and verbal cues required  HOME EXERCISE PROGRAM:  Standing hip flexor stretch at counter, prolonged standing with erect posture for gluteal activation, HS curls with RTB   Added the below HEP on 05/15/23  Access Code: [email or web address] URL: [email or web address] Date: 05/15/2023 Prepared by: Casimiro Needle  Exercises - Seated Long Arc Quad with Ankle Weight  - 1 x daily - 7 x weekly - 2 sets - 10 reps   GOALS: Goals reviewed with patient? No  SHORT TERM GOALS: Target date: 11/12/2022  Pt will be independent with HEP in order to improve strength and balance in order to decrease fall risk and improve function at home. Baseline: instructed in HEP today, to provide printout next visit Goal status: MET    LONG TERM GOALS: Target date: 09/09/2023  Pt will increase FOTO to at least 50 in order to demonstrate improvement in function related to mobility and QoL. Baseline: 44 7/29:48 9/10:40 12/24/2022= 51 Goal status: MET  2.  Pt (< 60 yrs old) will complete 5xSTS test < 30 seconds indicating an increase in LE strength and improved balance. Baseline: 10/01/22 34 sec (multiple initial attempts with posterior LOB in chair) 12/20: 49 seconds  11/04/22:1:06, min A on multiple attempts to maintain balance and pt stays in flexed posture throughout test when coming to standing.   9/10: 27.5 sec, Pushes WC post consistently, close CGA  10/22: 47 seconds first attempt 12/5: 1 minute, 37 seconds, multiple attempts to get first stand, but no difficulty after that; 03/20/2023= 1 min 2 sec with 1UE on walker and 1 on armrest- some posterior lean- Close CGA; 06/17/2023= 38 sec with 1 UE on walker and 1 on armrest Goal status: ONGOING  3.  Pt will increase to > 0.5 m/s (30 sec) as to improve gait speed for better home ambulation and reduce fall risk. Baseline: 10/01/22 0.096 m/s with RW, close CGA, and WC follow previously 12/27: 0.11 m/s 11/04/22: .056 m/s  9/10: 076 m/s 10/22: 0.64 m/s  11/26: .076 m/s; 03/20/2023= 0.08 m/s; 04/22/2023= 0.089 m/s; 05/15/2023 = 0.079 m/s; 05/27/2023= 0.11 m/s Goal status: PROGRESSING  4.  Pt will increase hip flexor and abductor  gross strength to 4+/5 bilat as to improve functional strength for independent gait, increased standing tolerance, and increased ADL ability. Baseline: 10/01/22 4+/5, exception hip flexors and abductors 4/5 each bilat  (previously 03/27/22: 4+/5 ) 9/10: Hip flexor 4/5, hip abductor 4+/5 10/22: hip flexor 4/5 hip abductor 4+/5 11/26: hip flexor 4+/5 hip abductor 4+/5 Goal status: MET  5.  Pt will ambulate 150 ft with BRW and WC follow with no rest breaks in less than 12 min to allow for improved mobility within home and increased independence. Baseline: 10/03/22 150' with BRW 04/03/22: 105 ft with BRW 7/29: 100 in 9:30 with pediatric RW 9/10:101 ft with BRW stooped posture worsened throughout test 12/24/2022- 150 feet using BRW, CGA with w/c follow- in 10 min 17 sec. Will keep goal to make sure he can do this consistently 10/22:  119 ft with BRW 8 min 23 seconds terminated by patient  11/26: 148 ft with BRW 14 min 34 seconds terminated by patient 03/20/2023= 152 feet using BRW, CGA and  w/c follow in 14 min 3 sec- Need to keep goal active to ensure consistency but will revised and add a time element to goal. 04/22/2023= 162 feet in 11 min 54 sec using bariatric RW, CGA and close CGA assist; 05/27/2023- 170 feet without rest break using bariatric RW. 06/17/2023= 160 feet with bariatric 4WW in 9 min- stopped due to fatigue.  Goal status: MET   6. Pt will decrease TUG to below 1 min 30 seconds/decrease in order to demonstrate decreased fall risk. Baseline: 06/17/2023= 2 min using bariatric 4WW-  Goal status: NEW  7. Pt will ambulate 300 ft with BRW, CGA and WC follow with no rest breaks in less than 20 min to allow for improved mobility within home/community and increased independence. Baseline: 06/17/2023= 160 feet with bariatric 4WW in 9 min- stopped due to fatigue.  Goal Status: NEW    ASSESSMENT :  CLINICAL IMPRESSION: Patient presents with excellent motivation for today's session. PT treatment focused on improved functional movement patterns to improve safety, and independence with ADLs. Noted difficulty with anterior weight shift and limb advancement laterally and with revery gait.  Patient will benefit from continued skilled physical therapy to further progress his strength and balance to decrease fall risk, improve QOL, and attain therapy goals.    OBJECTIVE IMPAIRMENTS Abnormal gait, decreased activity tolerance, decreased balance, decreased coordination, decreased endurance, decreased mobility, difficulty walking, decreased ROM, decreased strength, improper body mechanics, postural dysfunction, and obesity.   ACTIVITY LIMITATIONS carrying, lifting, bending, sitting, standing, squatting, stairs, transfers, bed mobility, continence, bathing, toileting, dressing, hygiene/grooming, and locomotion level  PARTICIPATION LIMITATIONS: meal prep, cleaning, laundry, interpersonal relationship, driving, shopping, community activity, and yard work  PERSONAL FACTORS Fitness,  Past/current experiences, Time since onset of injury/illness/exacerbation, and 3+ comorbidities: hx of brain tumor, past CVA, HTN, obesity, learning disability, DMII  are also affecting patient's functional outcome.   REHAB POTENTIAL: Fair    CLINICAL DECISION MAKING: Stable/uncomplicated  EVALUATION COMPLEXITY: Low  PLAN: PT FREQUENCY: 1-2x/week  PT DURATION: 12 weeks  PLANNED INTERVENTIONS: Therapeutic exercises, Therapeutic activity, Neuromuscular re-education, Balance training, Gait training, Patient/Family education, Joint mobilization, Stair training, Vestibular training, Orthotic/Fit training, DME instructions, Dry Needling, Electrical stimulation, Wheelchair mobility training, Cryotherapy, Moist heat, Compression bandaging, Manual therapy, and Re-evaluation  PLAN FOR NEXT SESSION:     Continue with functional strengthening exercises of the LE's and gait training using RW. Incorporate good technique with all transfers to decrease his risk of falling.     Golden Pop, PT Physical Therapist - [facility]  2:13 PM 06/19/23

## 2023-06-23 NOTE — Therapy (Signed)
 OUTPATIENT PHYSICAL THERAPY NEURO TREATMENT  Patient Name: Curtis Powell MRN: [id number] DOB:[birth year], 58 y.o., male Today's Date: 06/24/2023  PCP: Enid Baas, MD  REFERRING PROVIDER: Enid Baas, MD    PT End of Session - 06/24/23 1310     Visit Number 65    Number of Visits 87    Date for PT Re-Evaluation 09/09/23    Progress Note Due on Visit 70    PT Start Time 1315    PT Stop Time 1359    PT Time Calculation (min) 44 min    Equipment Utilized During Treatment Gait belt    Activity Tolerance Patient tolerated treatment well    Behavior During Therapy WFL for tasks assessed/performed                   Past Medical History:  Diagnosis Date   Asthma    GERD (gastroesophageal reflux disease)    Hyperlipidemia    Hypertension    Obesity    Stroke [facility])    Past Surgical History:  Procedure Laterality Date   BRAIN SURGERY     Patient Active Problem List   Diagnosis Date Noted   Impaired glucose tolerance 03/23/2020   Learning disability 03/23/2020   Urinary and fecal incontinence 06/14/2019   Chronic pain syndrome 03/23/2019   Constipation 03/23/2019   Dysarthria 03/23/2019   Dysphagia 03/23/2019   Learning difficulty 03/23/2019   Morbid obesity (HCC) 03/23/2019   Muscle weakness 03/23/2019   Vitamin D deficiency 03/23/2019   Bilateral carotid artery stenosis 12/15/2018   Moderate aortic valve stenosis 12/15/2018   BMI 45.0-49.9, adult (HCC) 11/10/2018   Cerebrovascular accident (HCC) 05/02/2016   Hemiparesis affecting right side as late effect of cerebrovascular accident (HCC) 05/01/2016   Brain tumor (HCC) 11/24/2014   Gastro-esophageal reflux disease without esophagitis 11/24/2014   Accident due to mechanical fall without injury 08/03/2014   Essential hypertension 06/13/2014   Mixed hyperlipidemia 06/13/2014   Unspecified sequelae of cerebral infarction 06/13/2014   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/2014   Type 2 diabetes mellitus without  complication (HCC) 01/06/2014   Neck mass 09/08/2013   Swimmer's ear 09/08/2013   Erectile dysfunction 01/03/2013   Prediabetes 01/03/2013   ONSET DATE: 05/09/2016  REFERRING DIAG: W09.811 (ICD-10-CM) - Hemiplegia and hemiparesis following cerebral infarction affecting right dominant side   THERAPY DIAG:  Unsteadiness on feet  Difficulty in walking, not elsewhere classified  Other abnormalities of gait and mobility  Abnormality of gait and mobility  Muscle weakness (generalized)  Other lack of coordination   Rationale for Evaluation and Treatment Rehabilitation  SUBJECTIVE:  SUBJECTIVE STATEMENT  Patient reports doing okay. States no falls since last visit.   Pt accompanied by: self   PERTINENT HISTORY:   The pt is a pleasant 58 yo male who returns to PT for evaluation of deficits s/o old CVA. Pt with hx of CVA Feb 2018. He was previously fully independent, but CVA caused significant deficits, mainly affecting his right side. Pt reports continued difficulty with walking and transfers. Pt still using WC for majority of mobility, otherwise he uses his RW. Pt lives with his mom who is his main caregiver. Patient had outpatient PT previously in clinic in 2023. He reports since discharge he has been completing his HEP and walking about every day. Pt would like to improve his walking mainly, but also would like to work on balance. He also reports difficulty with transfers. Pt's mom and his caregiver helps him with ADLs. However, he says he is now waiting for a new caregiver. Prior to this pt had caregiver 3x/week. PMH significant for hx of brain tumor, past CVA, DMII and HTN. See chart for additional information.  PAIN:  Are you having pain? Pt reports no pain currently  PRECAUTIONS: fall  WEIGHT  BEARING RESTRICTIONS none  PLOF: required assistance with ADLs (old CVA), prior to CVA in 2018 was indep.  History of Falls: Pt reports no recent falls  Home information: Pt lives with mother who is his primary caregiver. Previously had home aide, 3x/week but reports currently waiting for new one.  PATIENT GOALS: Pt would like to improve his ability to ambulate.  OBJECTIVE:  taken at [facility] unless otherwise noted:   DIAGNOSTIC FINDINGS: No recent imaging per chart  Objective measures taken at eval unless otherwise specified:   Posture: forward head posture, rounded shoulders  MMT LE: grossly  4+/5, exception hip flexors and abductors 4/5 each bilat   Functional tests:  5xSTS:  34 sec, use of BUE (multiple initial attempts with posterior LOB in chair)   : 0.096 m/s with RW, close CGA, and WC follow  Gait for distance (goal 150 ft): deferred due to time  Gait mechanics: impaired, decreased speed (see ), crouched posture, poor postural stability and relies heavily on BUE support on RW. Pt now with bilat AFOS   FOTO: 44 (goal 50)    TODAY'S TREATMENT  06/24/2023  Wearing B LE custom AFOs. PT applied gait belt at start of session, CGA provided throughout session, unless otherwise stated for improved safety   Sit<>stand in parallel bars with CGA from PT with heavy use of BUE support (1 UE on armrest and 1 on railing) 2 x 12 reps.   Standing in parallel bars: CGA and use of gait belt:  -Standing without UE support -  min-mod assist to prevent posterior LOB  x 2 min- VC to look ahead and tighten glutes  -Standing in // bars with resistive unilateral chest/back - push pull with 3#- 2 sets x 10 reps  -Forward reverse gait in parallel bars length of bars x 3 bouts     Assist from PT for improved erect posture to reduce posterior bias.     PATIENT EDUCATION: Education details: Pt educated throughout session about proper posture and technique with exercises. Improved  exercise technique, movement at target joints, use of target muscles after min to mod verbal, visual, tactile cues.  Person educated: Patient Education method: Explanation, Demonstration, Tactile cues, and Verbal cues Education comprehension: verbalized understanding, returned demonstration, and verbal cues required  HOME EXERCISE PROGRAM:  Standing hip flexor stretch at counter, prolonged standing with erect posture for gluteal activation, HS curls with RTB   Added the below HEP on 05/15/23  Access Code: [email or web address] URL: [email or web address] Date: 05/15/2023 Prepared by: Casimiro Needle  Exercises - Seated Long Arc Quad with Ankle Weight  - 1 x daily - 7 x weekly - 2 sets - 10 reps   GOALS: Goals reviewed with patient? No  SHORT TERM GOALS: Target date: 11/12/2022  Pt will be independent with HEP in order to improve strength and balance in order to decrease fall risk and improve function at home. Baseline: instructed in HEP today, to provide printout next visit Goal status: MET    LONG TERM GOALS: Target date: 09/09/2023  Pt will increase FOTO to at least 50 in order to demonstrate improvement in function related to mobility and QoL. Baseline: 44 7/29:48 9/10:40 12/24/2022= 51 Goal status: MET  2.  Pt (< 60 yrs old) will complete 5xSTS test < 30 seconds indicating an increase in LE strength and improved balance. Baseline: 10/01/22 34 sec (multiple initial attempts with posterior LOB in chair) 12/20: 49 seconds 11/04/22:1:06, min A on multiple attempts to maintain balance and pt stays in flexed posture throughout test when coming to standing.   9/10: 27.5 sec, Pushes WC post consistently, close CGA  10/22: 47 seconds first attempt 12/5: 1 minute, 37 seconds, multiple attempts to get first stand, but no difficulty after that; 03/20/2023= 1 min 2 sec with 1UE on walker and 1 on armrest- some posterior lean- Close CGA; 06/17/2023= 38 sec with 1 UE on walker and 1 on  armrest Goal status: ONGOING  3.  Pt will increase to > 0.5 m/s (30 sec) as to improve gait speed for better home ambulation and reduce fall risk. Baseline: 10/01/22 0.096 m/s with RW, close CGA, and WC follow previously 12/27: 0.11 m/s 11/04/22: .056 m/s  9/10: 076 m/s 10/22: 0.64 m/s  11/26: .076 m/s; 03/20/2023= 0.08 m/s; 04/22/2023= 0.089 m/s; 05/15/2023 = 0.079 m/s; 05/27/2023= 0.11 m/s Goal status: PROGRESSING  4.  Pt will increase hip flexor and abductor  gross strength to 4+/5 bilat as to improve functional strength for independent gait, increased standing tolerance, and increased ADL ability. Baseline: 10/01/22 4+/5, exception hip flexors and abductors 4/5 each bilat  (previously 03/27/22: 4+/5 ) 9/10: Hip flexor 4/5, hip abductor 4+/5 10/22: hip flexor 4/5 hip abductor 4+/5 11/26: hip flexor 4+/5 hip abductor 4+/5 Goal status: MET  5.  Pt will ambulate 150 ft with BRW and WC follow with no rest breaks in less than 12 min to allow for improved mobility within home and increased independence. Baseline: 10/03/22 150' with BRW 04/03/22: 105 ft with BRW 7/29: 100 in 9:30 with pediatric RW 9/10:101 ft with BRW stooped posture worsened throughout test 12/24/2022- 150 feet using BRW, CGA with w/c follow- in 10 min 17 sec. Will keep goal to make sure he can do this consistently 10/22: 119 ft with BRW 8 min 23 seconds terminated by patient  11/26: 148 ft with BRW 14 min 34 seconds terminated by patient 03/20/2023= 152 feet using BRW, CGA and w/c follow in 14 min 3 sec- Need to keep goal active to ensure consistency but will revised and add a time element to goal. 04/22/2023= 162 feet in 11 min 54 sec using bariatric RW, CGA and close CGA assist; 05/27/2023- 170 feet without rest break using bariatric RW. 06/17/2023= 160 feet with bariatric 4WW in 9 min- stopped due  to fatigue.  Goal status: MET   6. Pt will decrease TUG to below 1 min 30 seconds/decrease in order to demonstrate decreased fall  risk. Baseline: 06/17/2023= 2 min using bariatric 4WW-  Goal status: NEW  7. Pt will ambulate 300 ft with BRW, CGA and WC follow with no rest breaks in less than 20 min to allow for improved mobility within home/community and increased independence. Baseline: 06/17/2023= 160 feet with bariatric 4WW in 9 min- stopped due to fatigue.  Goal Status: NEW    ASSESSMENT :  CLINICAL IMPRESSION: Treatment continued to focus on postural awareness in standing and improving his anterior weight shift with standing activities. Overall he demonstrated some in session progress with improved overall posture with standing and anterior weight shift with less posterior LOB or dependence on PT to assist in maintaining standing posture.   Patient will benefit from continued skilled physical therapy to further progress his strength and balance to decrease fall risk, improve QOL, and attain therapy goals.    OBJECTIVE IMPAIRMENTS Abnormal gait, decreased activity tolerance, decreased balance, decreased coordination, decreased endurance, decreased mobility, difficulty walking, decreased ROM, decreased strength, improper body mechanics, postural dysfunction, and obesity.   ACTIVITY LIMITATIONS carrying, lifting, bending, sitting, standing, squatting, stairs, transfers, bed mobility, continence, bathing, toileting, dressing, hygiene/grooming, and locomotion level  PARTICIPATION LIMITATIONS: meal prep, cleaning, laundry, interpersonal relationship, driving, shopping, community activity, and yard work  PERSONAL FACTORS Fitness, Past/current experiences, Time since onset of injury/illness/exacerbation, and 3+ comorbidities: hx of brain tumor, past CVA, HTN, obesity, learning disability, DMII  are also affecting patient's functional outcome.   REHAB POTENTIAL: Fair    CLINICAL DECISION MAKING: Stable/uncomplicated  EVALUATION COMPLEXITY: Low  PLAN: PT FREQUENCY: 1-2x/week  PT DURATION: 12 weeks  PLANNED  INTERVENTIONS: Therapeutic exercises, Therapeutic activity, Neuromuscular re-education, Balance training, Gait training, Patient/Family education, Joint mobilization, Stair training, Vestibular training, Orthotic/Fit training, DME instructions, Dry Needling, Electrical stimulation, Wheelchair mobility training, Cryotherapy, Moist heat, Compression bandaging, Manual therapy, and Re-evaluation  PLAN FOR NEXT SESSION:     Continue with functional strengthening exercises of the LE's and gait training using RW. Incorporate good technique with all transfers to decrease his risk of falling.     Lenda Kelp, PT Physical Therapist - [facility]  2:01 PM 06/24/23

## 2023-06-24 ENCOUNTER — Ambulatory Visit: Payer: 59

## 2023-06-24 DIAGNOSIS — R262 Difficulty in walking, not elsewhere classified: Secondary | ICD-10-CM

## 2023-06-24 DIAGNOSIS — R2681 Unsteadiness on feet: Secondary | ICD-10-CM | POA: Diagnosis not present

## 2023-06-24 DIAGNOSIS — R278 Other lack of coordination: Secondary | ICD-10-CM

## 2023-06-24 DIAGNOSIS — M6281 Muscle weakness (generalized): Secondary | ICD-10-CM

## 2023-06-24 DIAGNOSIS — R269 Unspecified abnormalities of gait and mobility: Secondary | ICD-10-CM

## 2023-06-24 DIAGNOSIS — R2689 Other abnormalities of gait and mobility: Secondary | ICD-10-CM

## 2023-06-26 ENCOUNTER — Ambulatory Visit: Payer: 59

## 2023-06-26 DIAGNOSIS — R278 Other lack of coordination: Secondary | ICD-10-CM

## 2023-06-26 DIAGNOSIS — R296 Repeated falls: Secondary | ICD-10-CM

## 2023-06-26 DIAGNOSIS — R531 Weakness: Secondary | ICD-10-CM

## 2023-06-26 DIAGNOSIS — R262 Difficulty in walking, not elsewhere classified: Secondary | ICD-10-CM

## 2023-06-26 DIAGNOSIS — M6281 Muscle weakness (generalized): Secondary | ICD-10-CM

## 2023-06-26 DIAGNOSIS — R2689 Other abnormalities of gait and mobility: Secondary | ICD-10-CM

## 2023-06-26 DIAGNOSIS — R269 Unspecified abnormalities of gait and mobility: Secondary | ICD-10-CM

## 2023-06-26 DIAGNOSIS — R2681 Unsteadiness on feet: Secondary | ICD-10-CM | POA: Diagnosis not present

## 2023-06-26 NOTE — Therapy (Signed)
 OUTPATIENT PHYSICAL THERAPY NEURO TREATMENT  Patient Name: Curtis Powell MRN: [id number] DOB:12-01-1965, 58 y.o., male Today's Date: 06/26/2023  PCP: Enid Baas, MD  REFERRING PROVIDER: Enid Baas, MD    PT End of Session - 06/26/23 1310     Visit Number 66    Number of Visits 87    Date for PT Re-Evaluation 09/09/23    Progress Note Due on Visit 70    PT Start Time 1312    PT Stop Time 1353    PT Time Calculation (min) 41 min    Equipment Utilized During Treatment Gait belt    Activity Tolerance Patient tolerated treatment well    Behavior During Therapy WFL for tasks assessed/performed                   Past Medical History:  Diagnosis Date   Asthma    GERD (gastroesophageal reflux disease)    Hyperlipidemia    Hypertension    Obesity    Stroke [facility])    Past Surgical History:  Procedure Laterality Date   BRAIN SURGERY     Patient Active Problem List   Diagnosis Date Noted   Impaired glucose tolerance 03/23/2020   Learning disability 03/23/2020   Urinary and fecal incontinence 06/14/2019   Chronic pain syndrome 03/23/2019   Constipation 03/23/2019   Dysarthria 03/23/2019   Dysphagia 03/23/2019   Learning difficulty 03/23/2019   Morbid obesity (HCC) 03/23/2019   Muscle weakness 03/23/2019   Vitamin D deficiency 03/23/2019   Bilateral carotid artery stenosis 12/15/2018   Moderate aortic valve stenosis 12/15/2018   BMI 45.0-49.9, adult (HCC) 11/10/2018   Cerebrovascular accident (HCC) 05/02/2016   Hemiparesis affecting right side as late effect of cerebrovascular accident (HCC) 05/01/2016   Brain tumor (HCC) 11/24/2014   Gastro-esophageal reflux disease without esophagitis 11/24/2014   Accident due to mechanical fall without injury 08/03/2014   Essential hypertension 06/13/2014   Mixed hyperlipidemia 06/13/2014   Unspecified sequelae of cerebral infarction 06/13/2014   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/2014   Type 2 diabetes mellitus without  complication (HCC) 01/06/2014   Neck mass 09/08/2013   Swimmer's ear 09/08/2013   Erectile dysfunction 01/03/2013   Prediabetes 01/03/2013   ONSET DATE: 05/09/2016  REFERRING DIAG: Y86.578 (ICD-10-CM) - Hemiplegia and hemiparesis following cerebral infarction affecting right dominant side   THERAPY DIAG:  Unsteadiness on feet  Difficulty in walking, not elsewhere classified  Other abnormalities of gait and mobility  Abnormality of gait and mobility  Muscle weakness (generalized)  Other lack of coordination  Left-sided weakness  Repeated falls   Rationale for Evaluation and Treatment Rehabilitation  SUBJECTIVE:  SUBJECTIVE STATEMENT  Patient states feeling okay without any new issues.   Pt accompanied by: self   PERTINENT HISTORY:   The pt is a pleasant 58 yo male who returns to PT for evaluation of deficits s/o old CVA. Pt with hx of CVA Feb 2018. He was previously fully independent, but CVA caused significant deficits, mainly affecting his right side. Pt reports continued difficulty with walking and transfers. Pt still using WC for majority of mobility, otherwise he uses his RW. Pt lives with his mom who is his main caregiver. Patient had outpatient PT previously in clinic in 2023. He reports since discharge he has been completing his HEP and walking about every day. Pt would like to improve his walking mainly, but also would like to work on balance. He also reports difficulty with transfers. Pt's mom and his caregiver helps him with ADLs. However, he says he is now waiting for a new caregiver. Prior to this pt had caregiver 3x/week. PMH significant for hx of brain tumor, past CVA, DMII and HTN. See chart for additional information.  PAIN:  Are you having pain? Pt reports no pain  currently  PRECAUTIONS: fall  WEIGHT BEARING RESTRICTIONS none  PLOF: required assistance with ADLs (old CVA), prior to CVA in 2018 was indep.  History of Falls: Pt reports no recent falls  Home information: Pt lives with mother who is his primary caregiver. Previously had home aide, 3x/week but reports currently waiting for new one.  PATIENT GOALS: Pt would like to improve his ability to ambulate.  OBJECTIVE:  taken at [facility] unless otherwise noted:   DIAGNOSTIC FINDINGS: No recent imaging per chart  Objective measures taken at eval unless otherwise specified:   Posture: forward head posture, rounded shoulders  MMT LE: grossly  4+/5, exception hip flexors and abductors 4/5 each bilat   Functional tests:  5xSTS:  34 sec, use of BUE (multiple initial attempts with posterior LOB in chair)   : 0.096 m/s with RW, close CGA, and WC follow  Gait for distance (goal 150 ft): deferred due to time  Gait mechanics: impaired, decreased speed (see ), crouched posture, poor postural stability and relies heavily on BUE support on RW. Pt now with bilat AFOS   FOTO: 44 (goal 50)    TODAY'S TREATMENT  06/26/2023  Wearing B LE custom AFOs. PT applied gait belt at start of session, CGA provided throughout session, unless otherwise stated for improved safety    Therex:  Seated hip march alt LE x 15 reps Seated knee ext alt LE x 15 reps   Therapeutic activities:  Sit to stand (1 UE on armrest and 1 UE on handle of walker) x 15 reps (some posterior LOB initially requiring Min assist to keep from falling backward) - improved after initial 6 reps and progressed to only CGA for last 9 reps.  Static stand in front of w/c with UE support on walker- Alt UE raises- focusing on standing as erect as possible to minimal weight bearing through either UE- x 20 reps each side.   Gait in clinic: 95 feet followed by 100 feet using BRW, CGA and close w/c follow- Stopped on 1st rd due to back  soreness and on 2nd due to overall fatigue. Increased time required today due to focusing more on walking with trunk ext and head up.  Assist from PT for improved erect posture to reduce posterior bias.     PATIENT EDUCATION: Education details: Pt educated throughout session about  proper posture and technique with exercises. Improved exercise technique, movement at target joints, use of target muscles after min to mod verbal, visual, tactile cues.  Person educated: Patient Education method: Explanation, Demonstration, Tactile cues, and Verbal cues Education comprehension: verbalized understanding, returned demonstration, and verbal cues required  HOME EXERCISE PROGRAM:  Standing hip flexor stretch at counter, prolonged standing with erect posture for gluteal activation, HS curls with RTB   Added the below HEP on 05/15/23  Access Code: [email or web address] URL: [email or web address] Date: 05/15/2023 Prepared by: Casimiro Needle  Exercises - Seated Long Arc Quad with Ankle Weight  - 1 x daily - 7 x weekly - 2 sets - 10 reps   GOALS: Goals reviewed with patient? No  SHORT TERM GOALS: Target date: 11/12/2022  Pt will be independent with HEP in order to improve strength and balance in order to decrease fall risk and improve function at home. Baseline: instructed in HEP today, to provide printout next visit Goal status: MET    LONG TERM GOALS: Target date: 09/09/2023  Pt will increase FOTO to at least 50 in order to demonstrate improvement in function related to mobility and QoL. Baseline: 44 7/29:48 9/10:40 12/24/2022= 51 Goal status: MET  2.  Pt (< 60 yrs old) will complete 5xSTS test < 30 seconds indicating an increase in LE strength and improved balance. Baseline: 10/01/22 34 sec (multiple initial attempts with posterior LOB in chair) 12/20: 49 seconds 11/04/22:1:06, min A on multiple attempts to maintain balance and pt stays in flexed posture throughout test when coming to  standing.   9/10: 27.5 sec, Pushes WC post consistently, close CGA  10/22: 47 seconds first attempt 12/5: 1 minute, 37 seconds, multiple attempts to get first stand, but no difficulty after that; 03/20/2023= 1 min 2 sec with 1UE on walker and 1 on armrest- some posterior lean- Close CGA; 06/17/2023= 38 sec with 1 UE on walker and 1 on armrest Goal status: ONGOING  3.  Pt will increase to > 0.5 m/s (30 sec) as to improve gait speed for better home ambulation and reduce fall risk. Baseline: 10/01/22 0.096 m/s with RW, close CGA, and WC follow previously 12/27: 0.11 m/s 11/04/22: .056 m/s  9/10: 076 m/s 10/22: 0.64 m/s  11/26: .076 m/s; 03/20/2023= 0.08 m/s; 04/22/2023= 0.089 m/s; 05/15/2023 = 0.079 m/s; 05/27/2023= 0.11 m/s Goal status: PROGRESSING  4.  Pt will increase hip flexor and abductor  gross strength to 4+/5 bilat as to improve functional strength for independent gait, increased standing tolerance, and increased ADL ability. Baseline: 10/01/22 4+/5, exception hip flexors and abductors 4/5 each bilat  (previously 03/27/22: 4+/5 ) 9/10: Hip flexor 4/5, hip abductor 4+/5 10/22: hip flexor 4/5 hip abductor 4+/5 11/26: hip flexor 4+/5 hip abductor 4+/5 Goal status: MET  5.  Pt will ambulate 150 ft with BRW and WC follow with no rest breaks in less than 12 min to allow for improved mobility within home and increased independence. Baseline: 10/03/22 150' with BRW 04/03/22: 105 ft with BRW 7/29: 100 in 9:30 with pediatric RW 9/10:101 ft with BRW stooped posture worsened throughout test 12/24/2022- 150 feet using BRW, CGA with w/c follow- in 10 min 17 sec. Will keep goal to make sure he can do this consistently 10/22: 119 ft with BRW 8 min 23 seconds terminated by patient  11/26: 148 ft with BRW 14 min 34 seconds terminated by patient 03/20/2023= 152 feet using BRW, CGA and w/c follow in 14 min 3 sec- Need  to keep goal active to ensure consistency but will revised and add a time element to goal.  04/22/2023= 162 feet in 11 min 54 sec using bariatric RW, CGA and close CGA assist; 05/27/2023- 170 feet without rest break using bariatric RW. 06/17/2023= 160 feet with bariatric 4WW in 9 min- stopped due to fatigue.  Goal status: MET   6. Pt will decrease TUG to below 1 min 30 seconds/decrease in order to demonstrate decreased fall risk. Baseline: 06/17/2023= 2 min using bariatric 4WW-  Goal status: NEW  7. Pt will ambulate 300 ft with BRW, CGA and WC follow with no rest breaks in less than 20 min to allow for improved mobility within home/community and increased independence. Baseline: 06/17/2023= 160 feet with bariatric 4WW in 9 min- stopped due to fatigue.  Goal Status: NEW    ASSESSMENT :  CLINICAL IMPRESSION: Patient continues to demo progress with improved overall standing erect posture with less overall VC. He struggled more with walking as he becomes accustomed to standing more erect but overall able to increase his total distance with 1 short rest break today.  Patient will benefit from continued skilled physical therapy to further progress his strength and balance to decrease fall risk, improve QOL, and attain therapy goals.    OBJECTIVE IMPAIRMENTS Abnormal gait, decreased activity tolerance, decreased balance, decreased coordination, decreased endurance, decreased mobility, difficulty walking, decreased ROM, decreased strength, improper body mechanics, postural dysfunction, and obesity.   ACTIVITY LIMITATIONS carrying, lifting, bending, sitting, standing, squatting, stairs, transfers, bed mobility, continence, bathing, toileting, dressing, hygiene/grooming, and locomotion level  PARTICIPATION LIMITATIONS: meal prep, cleaning, laundry, interpersonal relationship, driving, shopping, community activity, and yard work  PERSONAL FACTORS Fitness, Past/current experiences, Time since onset of injury/illness/exacerbation, and 3+ comorbidities: hx of brain tumor, past CVA, HTN, obesity,  learning disability, DMII  are also affecting patient's functional outcome.   REHAB POTENTIAL: Fair    CLINICAL DECISION MAKING: Stable/uncomplicated  EVALUATION COMPLEXITY: Low  PLAN: PT FREQUENCY: 1-2x/week  PT DURATION: 12 weeks  PLANNED INTERVENTIONS: Therapeutic exercises, Therapeutic activity, Neuromuscular re-education, Balance training, Gait training, Patient/Family education, Joint mobilization, Stair training, Vestibular training, Orthotic/Fit training, DME instructions, Dry Needling, Electrical stimulation, Wheelchair mobility training, Cryotherapy, Moist heat, Compression bandaging, Manual therapy, and Re-evaluation  PLAN FOR NEXT SESSION:     Continue with functional strengthening exercises of the LE's and gait training using RW. Incorporate good technique with all transfers to decrease his risk of falling.     Lenda Kelp, PT Physical Therapist - [place]  [facility]  2:25 PM 06/26/23

## 2023-07-01 ENCOUNTER — Ambulatory Visit: Payer: 59

## 2023-07-03 ENCOUNTER — Ambulatory Visit: Payer: 59

## 2023-07-08 ENCOUNTER — Ambulatory Visit: Payer: 59 | Attending: Internal Medicine

## 2023-07-08 DIAGNOSIS — R296 Repeated falls: Secondary | ICD-10-CM | POA: Insufficient documentation

## 2023-07-08 DIAGNOSIS — M6281 Muscle weakness (generalized): Secondary | ICD-10-CM | POA: Insufficient documentation

## 2023-07-08 DIAGNOSIS — R262 Difficulty in walking, not elsewhere classified: Secondary | ICD-10-CM | POA: Insufficient documentation

## 2023-07-08 DIAGNOSIS — R531 Weakness: Secondary | ICD-10-CM | POA: Diagnosis present

## 2023-07-08 DIAGNOSIS — R2689 Other abnormalities of gait and mobility: Secondary | ICD-10-CM | POA: Diagnosis present

## 2023-07-08 DIAGNOSIS — R269 Unspecified abnormalities of gait and mobility: Secondary | ICD-10-CM | POA: Insufficient documentation

## 2023-07-08 DIAGNOSIS — R278 Other lack of coordination: Secondary | ICD-10-CM | POA: Diagnosis present

## 2023-07-08 DIAGNOSIS — R2681 Unsteadiness on feet: Secondary | ICD-10-CM | POA: Insufficient documentation

## 2023-07-08 NOTE — Therapy (Signed)
 OUTPATIENT PHYSICAL THERAPY NEURO TREATMENT  Patient Name: Curtis Powell MRN: [id number] DOB:17-Feb-1966, 58 y.o., male Today's Date: 07/09/2023  PCP: Enid Baas, MD  REFERRING PROVIDER: Enid Baas, MD    PT End of Session - 07/08/23 1312     Visit Number [id number]    Number of Visits 87    Date for PT Re-Evaluation 09/09/23    Progress Note Due on Visit 70    PT Start Time 1313    PT Stop Time 1358    PT Time Calculation (min) 45 min    Equipment Utilized During Treatment Gait belt    Activity Tolerance Patient tolerated treatment well    Behavior During Therapy WFL for tasks assessed/performed                   Past Medical History:  Diagnosis Date   Asthma    GERD (gastroesophageal reflux disease)    Hyperlipidemia    Hypertension    Obesity    Stroke [facility])    Past Surgical History:  Procedure Laterality Date   BRAIN SURGERY     Patient Active Problem List   Diagnosis Date Noted   Impaired glucose tolerance 03/23/2020   Learning disability 03/23/2020   Urinary and fecal incontinence 06/14/2019   Chronic pain syndrome 03/23/2019   Constipation 03/23/2019   Dysarthria 03/23/2019   Dysphagia 03/23/2019   Learning difficulty 03/23/2019   Morbid obesity (HCC) 03/23/2019   Muscle weakness 03/23/2019   Vitamin D deficiency 03/23/2019   Bilateral carotid artery stenosis 12/15/2018   Moderate aortic valve stenosis 12/15/2018   BMI 45.0-49.9, adult (HCC) 11/10/2018   Cerebrovascular accident (HCC) 05/02/2016   Hemiparesis affecting right side as late effect of cerebrovascular accident (HCC) 05/01/2016   Brain tumor (HCC) 11/24/2014   Gastro-esophageal reflux disease without esophagitis 11/24/2014   Accident due to mechanical fall without injury 08/03/2014   Essential hypertension 06/13/2014   Mixed hyperlipidemia 06/13/2014   Unspecified sequelae of cerebral infarction 06/13/2014   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/2014   Type 2 diabetes mellitus without  complication (HCC) 01/06/2014   Neck mass 09/08/2013   Swimmer's ear 09/08/2013   Erectile dysfunction 01/03/2013   Prediabetes 01/03/2013   ONSET DATE: 05/09/2016  REFERRING DIAG: V78.469 (ICD-10-CM) - Hemiplegia and hemiparesis following cerebral infarction affecting right dominant side   THERAPY DIAG:  Difficulty in walking, not elsewhere classified  Unsteadiness on feet  Other abnormalities of gait and mobility  Abnormality of gait and mobility  Muscle weakness (generalized)  Other lack of coordination  Left-sided weakness   Rationale for Evaluation and Treatment Rehabilitation  SUBJECTIVE:  SUBJECTIVE STATEMENT  Patient states he has been working on his legs and feeling some stronger this week.    Pt accompanied by: self   PERTINENT HISTORY:   The pt is a pleasant 58 yo male who returns to PT for evaluation of deficits s/o old CVA. Pt with hx of CVA Feb 2018. He was previously fully independent, but CVA caused significant deficits, mainly affecting his right side. Pt reports continued difficulty with walking and transfers. Pt still using WC for majority of mobility, otherwise he uses his RW. Pt lives with his mom who is his main caregiver. Patient had outpatient PT previously in clinic in 2023. He reports since discharge he has been completing his HEP and walking about every day. Pt would like to improve his walking mainly, but also would like to work on balance. He also reports difficulty with transfers. Pt's mom and his caregiver helps him with ADLs. However, he says he is now waiting for a new caregiver. Prior to this pt had caregiver 3x/week. PMH significant for hx of brain tumor, past CVA, DMII and HTN. See chart for additional information.  PAIN:  Are you having pain? Pt reports no  pain currently  PRECAUTIONS: fall  WEIGHT BEARING RESTRICTIONS none  PLOF: required assistance with ADLs (old CVA), prior to CVA in 2018 was indep.  History of Falls: Pt reports no recent falls  Home information: Pt lives with mother who is his primary caregiver. Previously had home aide, 3x/week but reports currently waiting for new one.  PATIENT GOALS: Pt would like to improve his ability to ambulate.  OBJECTIVE:  taken at [facility] unless otherwise noted:   DIAGNOSTIC FINDINGS: No recent imaging per chart  Objective measures taken at eval unless otherwise specified:   Posture: forward head posture, rounded shoulders  MMT LE: grossly  4+/5, exception hip flexors and abductors 4/5 each bilat   Functional tests:  5xSTS:  34 sec, use of BUE (multiple initial attempts with posterior LOB in chair)   : 0.096 m/s with RW, close CGA, and WC follow  Gait for distance (goal 150 ft): deferred due to time  Gait mechanics: impaired, decreased speed (see ), crouched posture, poor postural stability and relies heavily on BUE support on RW. Pt now with bilat AFOS   FOTO: 44 (goal 50)    TODAY'S TREATMENT  07/09/2023  Wearing B LE custom AFOs. PT applied gait belt at start of session, CGA provided throughout session, unless otherwise stated for improved safety      Therapeutic activities:  Sit to stand (1 UE on armrest and 1 UE on handle of walker) x 15 reps (some posterior LOB initially requiring Min assist to keep from falling backward) - improved after initial 6 reps and progressed to only CGA for last 9 reps.  Static stand  at support bar with UE support - 4 min with erect posture with patient reading items written on dry erase board.   Static stand at support bar with UE Support-  Alt UE raises- focusing on standing as erect as possible to minimal weight bearing through either UE- x 25 reps each side.   Static stand at support bar with UE Support-  Alt hip march x 20  reps  Static stand at Support bar with UE support- alt Hip abd x 20 reps  Static stand at Support bar with UE suppor- alt Hip ext  x 20 reps  Static stand without UE support (engaging in conversation) - focusing on  erect posture and BLE Weight bearing.         PATIENT EDUCATION: Education details: Pt educated throughout session about proper posture and technique with exercises. Improved exercise technique, movement at target joints, use of target muscles after min to mod verbal, visual, tactile cues.  Person educated: Patient Education method: Explanation, Demonstration, Tactile cues, and Verbal cues Education comprehension: verbalized understanding, returned demonstration, and verbal cues required  HOME EXERCISE PROGRAM:  Standing hip flexor stretch at counter, prolonged standing with erect posture for gluteal activation, HS curls with RTB   Added the below HEP on 05/15/23  Access Code: [email or web address] URL: [email or web address] Date: 05/15/2023 Prepared by: Casimiro Needle  Exercises - Seated Long Arc Quad with Ankle Weight  - 1 x daily - 7 x weekly - 2 sets - 10 reps   GOALS: Goals reviewed with patient? No  SHORT TERM GOALS: Target date: 11/12/2022  Pt will be independent with HEP in order to improve strength and balance in order to decrease fall risk and improve function at home. Baseline: instructed in HEP today, to provide printout next visit Goal status: MET    LONG TERM GOALS: Target date: 09/09/2023  Pt will increase FOTO to at least 50 in order to demonstrate improvement in function related to mobility and QoL. Baseline: 44 7/29:48 9/10:40 12/24/2022= 51 Goal status: MET  2.  Pt (< 60 yrs old) will complete 5xSTS test < 30 seconds indicating an increase in LE strength and improved balance. Baseline: 10/01/22 34 sec (multiple initial attempts with posterior LOB in chair) 12/20: 49 seconds 11/04/22:1:06, min A on multiple attempts to maintain balance and pt  stays in flexed posture throughout test when coming to standing.   9/10: 27.5 sec, Pushes WC post consistently, close CGA  10/22: 47 seconds first attempt 12/5: 1 minute, 37 seconds, multiple attempts to get first stand, but no difficulty after that; 03/20/2023= 1 min 2 sec with 1UE on walker and 1 on armrest- some posterior lean- Close CGA; 06/17/2023= 38 sec with 1 UE on walker and 1 on armrest Goal status: ONGOING  3.  Pt will increase to > 0.5 m/s (30 sec) as to improve gait speed for better home ambulation and reduce fall risk. Baseline: 10/01/22 0.096 m/s with RW, close CGA, and WC follow previously 12/27: 0.11 m/s 11/04/22: .056 m/s  9/10: 076 m/s 10/22: 0.64 m/s  11/26: .076 m/s; 03/20/2023= 0.08 m/s; 04/22/2023= 0.089 m/s; 05/15/2023 = 0.079 m/s; 05/27/2023= 0.11 m/s Goal status: PROGRESSING  4.  Pt will increase hip flexor and abductor  gross strength to 4+/5 bilat as to improve functional strength for independent gait, increased standing tolerance, and increased ADL ability. Baseline: 10/01/22 4+/5, exception hip flexors and abductors 4/5 each bilat  (previously 03/27/22: 4+/5 ) 9/10: Hip flexor 4/5, hip abductor 4+/5 10/22: hip flexor 4/5 hip abductor 4+/5 11/26: hip flexor 4+/5 hip abductor 4+/5 Goal status: MET  5.  Pt will ambulate 150 ft with BRW and WC follow with no rest breaks in less than 12 min to allow for improved mobility within home and increased independence. Baseline: 10/03/22 150' with BRW 04/03/22: 105 ft with BRW 7/29: 100 in 9:30 with pediatric RW 9/10:101 ft with BRW stooped posture worsened throughout test 12/24/2022- 150 feet using BRW, CGA with w/c follow- in 10 min 17 sec. Will keep goal to make sure he can do this consistently 10/22: 119 ft with BRW 8 min 23 seconds terminated by patient  11/26: 148 ft with  BRW 14 min 34 seconds terminated by patient 03/20/2023= 152 feet using BRW, CGA and w/c follow in 14 min 3 sec- Need to keep goal active to ensure  consistency but will revised and add a time element to goal. 04/22/2023= 162 feet in 11 min 54 sec using bariatric RW, CGA and close CGA assist; 05/27/2023- 170 feet without rest break using bariatric RW. 06/17/2023= 160 feet with bariatric 4WW in 9 min- stopped due to fatigue.  Goal status: MET   6. Pt will decrease TUG to below 1 min 30 seconds/decrease in order to demonstrate decreased fall risk. Baseline: 06/17/2023= 2 min using bariatric 4WW-  Goal status: NEW  7. Pt will ambulate 300 ft with BRW, CGA and WC follow with no rest breaks in less than 20 min to allow for improved mobility within home/community and increased independence. Baseline: 06/17/2023= 160 feet with bariatric 4WW in 9 min- stopped due to fatigue.  Goal Status: NEW    ASSESSMENT :  CLINICAL IMPRESSION: Treatment focused on standing functional endurance and LE strengthening. He performed well overall- able to spend majority of visit in standing position and demonstrating more erect posture. Continues to exhibit some B hip flexor tightness and posterior LE weakness. Patient will benefit from continued skilled physical therapy to further progress his strength and balance to decrease fall risk, improve QOL, and attain therapy goals.    OBJECTIVE IMPAIRMENTS Abnormal gait, decreased activity tolerance, decreased balance, decreased coordination, decreased endurance, decreased mobility, difficulty walking, decreased ROM, decreased strength, improper body mechanics, postural dysfunction, and obesity.   ACTIVITY LIMITATIONS carrying, lifting, bending, sitting, standing, squatting, stairs, transfers, bed mobility, continence, bathing, toileting, dressing, hygiene/grooming, and locomotion level  PARTICIPATION LIMITATIONS: meal prep, cleaning, laundry, interpersonal relationship, driving, shopping, community activity, and yard work  PERSONAL FACTORS Fitness, Past/current experiences, Time since onset of injury/illness/exacerbation,  and 3+ comorbidities: hx of brain tumor, past CVA, HTN, obesity, learning disability, DMII  are also affecting patient's functional outcome.   REHAB POTENTIAL: Fair    CLINICAL DECISION MAKING: Stable/uncomplicated  EVALUATION COMPLEXITY: Low  PLAN: PT FREQUENCY: 1-2x/week  PT DURATION: 12 weeks  PLANNED INTERVENTIONS: Therapeutic exercises, Therapeutic activity, Neuromuscular re-education, Balance training, Gait training, Patient/Family education, Joint mobilization, Stair training, Vestibular training, Orthotic/Fit training, DME instructions, Dry Needling, Electrical stimulation, Wheelchair mobility training, Cryotherapy, Moist heat, Compression bandaging, Manual therapy, and Re-evaluation  PLAN FOR NEXT SESSION:     Continue with functional strengthening exercises of the LE's and gait training using RW. Incorporate good technique with all transfers to decrease his risk of falling.     Lenda Kelp, PT Physical Therapist - [place]  [facility]  9:10 AM 07/09/23

## 2023-07-10 ENCOUNTER — Ambulatory Visit: Payer: 59 | Admitting: Physical Therapy

## 2023-07-10 DIAGNOSIS — R262 Difficulty in walking, not elsewhere classified: Secondary | ICD-10-CM | POA: Diagnosis not present

## 2023-07-10 DIAGNOSIS — R278 Other lack of coordination: Secondary | ICD-10-CM

## 2023-07-10 DIAGNOSIS — R296 Repeated falls: Secondary | ICD-10-CM

## 2023-07-10 DIAGNOSIS — R531 Weakness: Secondary | ICD-10-CM

## 2023-07-10 DIAGNOSIS — R2681 Unsteadiness on feet: Secondary | ICD-10-CM

## 2023-07-10 DIAGNOSIS — M6281 Muscle weakness (generalized): Secondary | ICD-10-CM

## 2023-07-10 DIAGNOSIS — R2689 Other abnormalities of gait and mobility: Secondary | ICD-10-CM

## 2023-07-10 DIAGNOSIS — R269 Unspecified abnormalities of gait and mobility: Secondary | ICD-10-CM

## 2023-07-10 NOTE — Therapy (Signed)
 OUTPATIENT PHYSICAL THERAPY NEURO TREATMENT  Patient Name: Curtis Powell MRN: [id number] DOB:1966/03/06, 58 y.o., male Today's Date: 07/10/2023  PCP: Enid Baas, MD  REFERRING PROVIDER: Enid Baas, MD    PT End of Session - 07/10/23 1619     Visit Number 68    Number of Visits 87    Date for PT Re-Evaluation 09/09/23    PT Start Time 1345    PT Stop Time 1400    PT Time Calculation (min) 15 min    Equipment Utilized During Treatment Gait belt    Activity Tolerance Patient tolerated treatment well    Behavior During Therapy WFL for tasks assessed/performed                   Past Medical History:  Diagnosis Date   Asthma    GERD (gastroesophageal reflux disease)    Hyperlipidemia    Hypertension    Obesity    Stroke [facility])    Past Surgical History:  Procedure Laterality Date   BRAIN SURGERY     Patient Active Problem List   Diagnosis Date Noted   Impaired glucose tolerance 03/23/2020   Learning disability 03/23/2020   Urinary and fecal incontinence 06/14/2019   Chronic pain syndrome 03/23/2019   Constipation 03/23/2019   Dysarthria 03/23/2019   Dysphagia 03/23/2019   Learning difficulty 03/23/2019   Morbid obesity (HCC) 03/23/2019   Muscle weakness 03/23/2019   Vitamin D deficiency 03/23/2019   Bilateral carotid artery stenosis 12/15/2018   Moderate aortic valve stenosis 12/15/2018   BMI 45.0-49.9, adult (HCC) 11/10/2018   Cerebrovascular accident (HCC) 05/02/2016   Hemiparesis affecting right side as late effect of cerebrovascular accident (HCC) 05/01/2016   Brain tumor (HCC) 11/24/2014   Gastro-esophageal reflux disease without esophagitis 11/24/2014   Accident due to mechanical fall without injury 08/03/2014   Essential hypertension 06/13/2014   Mixed hyperlipidemia 06/13/2014   Unspecified sequelae of cerebral infarction 06/13/2014   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/2014   Type 2 diabetes mellitus without complication (HCC) 01/06/2014   Neck  mass 09/08/2013   Swimmer's ear 09/08/2013   Erectile dysfunction 01/03/2013   Prediabetes 01/03/2013   ONSET DATE: 05/09/2016  REFERRING DIAG: Y86.578 (ICD-10-CM) - Hemiplegia and hemiparesis following cerebral infarction affecting right dominant side   THERAPY DIAG:  Difficulty in walking, not elsewhere classified  Unsteadiness on feet  Other abnormalities of gait and mobility  Abnormality of gait and mobility  Muscle weakness (generalized)  Other lack of coordination  Repeated falls  Left-sided weakness   Rationale for Evaluation and Treatment Rehabilitation  SUBJECTIVE:  SUBJECTIVE STATEMENT  Pt arrived very late to PT treatments. Willing to have shortened session due to late arrival.    Pt accompanied by: self   PERTINENT HISTORY:   The pt is a pleasant 58 yo male who returns to PT for evaluation of deficits s/o old CVA. Pt with hx of CVA Feb 2018. He was previously fully independent, but CVA caused significant deficits, mainly affecting his right side. Pt reports continued difficulty with walking and transfers. Pt still using WC for majority of mobility, otherwise he uses his RW. Pt lives with his mom who is his main caregiver. Patient had outpatient PT previously in clinic in 2023. He reports since discharge he has been completing his HEP and walking about every day. Pt would like to improve his walking mainly, but also would like to work on balance. He also reports difficulty with transfers. Pt's mom and his caregiver helps him with ADLs. However, he says he is now waiting for a new caregiver. Prior to this pt had caregiver 3x/week. PMH significant for hx of brain tumor, past CVA, DMII and HTN. See chart for additional information.  PAIN:  Are you having pain? Pt reports no pain  currently  PRECAUTIONS: fall  WEIGHT BEARING RESTRICTIONS none  PLOF: required assistance with ADLs (old CVA), prior to CVA in 2018 was indep.  History of Falls: Pt reports no recent falls  Home information: Pt lives with mother who is his primary caregiver. Previously had home aide, 3x/week but reports currently waiting for new one.  PATIENT GOALS: Pt would like to improve his ability to ambulate.  OBJECTIVE:  taken at [facility] unless otherwise noted:   DIAGNOSTIC FINDINGS: No recent imaging per chart  Objective measures taken at eval unless otherwise specified:   Posture: forward head posture, rounded shoulders  MMT LE: grossly  4+/5, exception hip flexors and abductors 4/5 each bilat   Functional tests:  5xSTS:  34 sec, use of BUE (multiple initial attempts with posterior LOB in chair)   : 0.096 m/s with RW, close CGA, and WC follow  Gait for distance (goal 150 ft): deferred due to time  Gait mechanics: impaired, decreased speed (see ), crouched posture, poor postural stability and relies heavily on BUE support on RW. Pt now with bilat AFOS   FOTO: 44 (goal 50)    TODAY'S TREATMENT  07/10/2023  Wearing B LE custom AFOs. PT applied gait belt at start of session, CGA provided throughout session, unless otherwise stated for improved safety   Gait with RW and BAFO x 146ft with CGA for safety. Cues for posture, step length, and improved pelvic rotation.   PT also assisted pt tighten L elevating foot rest support bolt.      PATIENT EDUCATION: Education details: Pt educated throughout session about proper posture and technique with exercises. Improved exercise technique, movement at target joints, use of target muscles after min to mod verbal, visual, tactile cues.  Person educated: Patient Education method: Explanation, Demonstration, Tactile cues, and Verbal cues Education comprehension: verbalized understanding, returned demonstration, and verbal cues  required  HOME EXERCISE PROGRAM:  Standing hip flexor stretch at counter, prolonged standing with erect posture for gluteal activation, HS curls with RTB   Added the below HEP on 05/15/23  Access Code: [email or web address] URL: [email or web address] Date: 05/15/2023 Prepared by: Casimiro Needle  Exercises - Seated Long Arc Quad with Ankle Weight  - 1 x daily - 7 x weekly - 2 sets - 10 reps   GOALS: Goals  reviewed with patient? No  SHORT TERM GOALS: Target date: 11/12/2022  Pt will be independent with HEP in order to improve strength and balance in order to decrease fall risk and improve function at home. Baseline: instructed in HEP today, to provide printout next visit Goal status: MET    LONG TERM GOALS: Target date: 09/09/2023  Pt will increase FOTO to at least 50 in order to demonstrate improvement in function related to mobility and QoL. Baseline: 44 7/29:48 9/10:40 12/24/2022= 51 Goal status: MET  2.  Pt (< 60 yrs old) will complete 5xSTS test < 30 seconds indicating an increase in LE strength and improved balance. Baseline: 10/01/22 34 sec (multiple initial attempts with posterior LOB in chair) 12/20: 49 seconds 11/04/22:1:06, min A on multiple attempts to maintain balance and pt stays in flexed posture throughout test when coming to standing.   9/10: 27.5 sec, Pushes WC post consistently, close CGA  10/22: 47 seconds first attempt 12/5: 1 minute, 37 seconds, multiple attempts to get first stand, but no difficulty after that; 03/20/2023= 1 min 2 sec with 1UE on walker and 1 on armrest- some posterior lean- Close CGA; 06/17/2023= 38 sec with 1 UE on walker and 1 on armrest Goal status: ONGOING  3.  Pt will increase to > 0.5 m/s (30 sec) as to improve gait speed for better home ambulation and reduce fall risk. Baseline: 10/01/22 0.096 m/s with RW, close CGA, and WC follow previously 12/27: 0.11 m/s 11/04/22: .056 m/s  9/10: 076 m/s 10/22: 0.64 m/s  11/26: .076 m/s;  03/20/2023= 0.08 m/s; 04/22/2023= 0.089 m/s; 05/15/2023 = 0.079 m/s; 05/27/2023= 0.11 m/s Goal status: PROGRESSING  4.  Pt will increase hip flexor and abductor  gross strength to 4+/5 bilat as to improve functional strength for independent gait, increased standing tolerance, and increased ADL ability. Baseline: 10/01/22 4+/5, exception hip flexors and abductors 4/5 each bilat  (previously 03/27/22: 4+/5 ) 9/10: Hip flexor 4/5, hip abductor 4+/5 10/22: hip flexor 4/5 hip abductor 4+/5 11/26: hip flexor 4+/5 hip abductor 4+/5 Goal status: MET  5.  Pt will ambulate 150 ft with BRW and WC follow with no rest breaks in less than 12 min to allow for improved mobility within home and increased independence. Baseline: 10/03/22 150' with BRW 04/03/22: 105 ft with BRW 7/29: 100 in 9:30 with pediatric RW 9/10:101 ft with BRW stooped posture worsened throughout test 12/24/2022- 150 feet using BRW, CGA with w/c follow- in 10 min 17 sec. Will keep goal to make sure he can do this consistently 10/22: 119 ft with BRW 8 min 23 seconds terminated by patient  11/26: 148 ft with BRW 14 min 34 seconds terminated by patient 03/20/2023= 152 feet using BRW, CGA and w/c follow in 14 min 3 sec- Need to keep goal active to ensure consistency but will revised and add a time element to goal. 04/22/2023= 162 feet in 11 min 54 sec using bariatric RW, CGA and close CGA assist; 05/27/2023- 170 feet without rest break using bariatric RW. 06/17/2023= 160 feet with bariatric 4WW in 9 min- stopped due to fatigue.  Goal status: MET   6. Pt will decrease TUG to below 1 min 30 seconds/decrease in order to demonstrate decreased fall risk. Baseline: 06/17/2023= 2 min using bariatric 4WW-  Goal status: NEW  7. Pt will ambulate 300 ft with BRW, CGA and WC follow with no rest breaks in less than 20 min to allow for improved mobility within home/community and  increased independence. Baseline: 06/17/2023= 160 feet with bariatric 4WW in 9 min-  stopped due to fatigue.  Goal Status: NEW    ASSESSMENT :  CLINICAL IMPRESSION: Pt session limited due to late arrival. Gait training performed with RW to improve functional strength, endurance, and posture. Noted ot have increased step length compared to prior session with this PT. Patient will benefit from continued skilled physical therapy to further progress his strength and balance to decrease fall risk, improve QOL, and attain therapy goals.    OBJECTIVE IMPAIRMENTS Abnormal gait, decreased activity tolerance, decreased balance, decreased coordination, decreased endurance, decreased mobility, difficulty walking, decreased ROM, decreased strength, improper body mechanics, postural dysfunction, and obesity.   ACTIVITY LIMITATIONS carrying, lifting, bending, sitting, standing, squatting, stairs, transfers, bed mobility, continence, bathing, toileting, dressing, hygiene/grooming, and locomotion level  PARTICIPATION LIMITATIONS: meal prep, cleaning, laundry, interpersonal relationship, driving, shopping, community activity, and yard work  PERSONAL FACTORS Fitness, Past/current experiences, Time since onset of injury/illness/exacerbation, and 3+ comorbidities: hx of brain tumor, past CVA, HTN, obesity, learning disability, DMII  are also affecting patient's functional outcome.   REHAB POTENTIAL: Fair    CLINICAL DECISION MAKING: Stable/uncomplicated  EVALUATION COMPLEXITY: Low  PLAN: PT FREQUENCY: 1-2x/week  PT DURATION: 12 weeks  PLANNED INTERVENTIONS: Therapeutic exercises, Therapeutic activity, Neuromuscular re-education, Balance training, Gait training, Patient/Family education, Joint mobilization, Stair training, Vestibular training, Orthotic/Fit training, DME instructions, Dry Needling, Electrical stimulation, Wheelchair mobility training, Cryotherapy, Moist heat, Compression bandaging, Manual therapy, and Re-evaluation  PLAN FOR NEXT SESSION:     Continue with functional  strengthening exercises of the LE's and gait training using RW. Incorporate good technique with all transfers to decrease his risk of falling.     Golden Pop, PT Physical Therapist - [place]  [facility]  4:20 PM 07/10/23

## 2023-07-11 NOTE — Progress Notes (Signed)
 Viral illness with concern for possible covid/influenza

## 2023-07-15 ENCOUNTER — Ambulatory Visit: Payer: 59

## 2023-07-15 DIAGNOSIS — R2681 Unsteadiness on feet: Secondary | ICD-10-CM

## 2023-07-15 DIAGNOSIS — R262 Difficulty in walking, not elsewhere classified: Secondary | ICD-10-CM | POA: Diagnosis not present

## 2023-07-15 DIAGNOSIS — R278 Other lack of coordination: Secondary | ICD-10-CM

## 2023-07-15 DIAGNOSIS — R296 Repeated falls: Secondary | ICD-10-CM

## 2023-07-15 DIAGNOSIS — M6281 Muscle weakness (generalized): Secondary | ICD-10-CM

## 2023-07-15 DIAGNOSIS — R269 Unspecified abnormalities of gait and mobility: Secondary | ICD-10-CM

## 2023-07-15 DIAGNOSIS — R2689 Other abnormalities of gait and mobility: Secondary | ICD-10-CM

## 2023-07-15 NOTE — Therapy (Signed)
 OUTPATIENT PHYSICAL THERAPY NEURO TREATMENT  Patient Name: Curtis Powell MRN: [id number] DOB:[birth year], 58 y.o., male Today's Date: 07/16/2023  PCP: Enid Baas, MD  REFERRING PROVIDER: Enid Baas, MD    PT End of Session - 07/15/23 1259     Visit Number 69    Number of Visits 87    Date for PT Re-Evaluation 09/09/23    Progress Note Due on Visit 70    PT Start Time 1315    PT Stop Time 1357    PT Time Calculation (min) 42 min    Equipment Utilized During Treatment Gait belt    Activity Tolerance Patient tolerated treatment well    Behavior During Therapy WFL for tasks assessed/performed                    Past Medical History:  Diagnosis Date   Asthma    GERD (gastroesophageal reflux disease)    Hyperlipidemia    Hypertension    Obesity    Stroke [facility])    Past Surgical History:  Procedure Laterality Date   BRAIN SURGERY     Patient Active Problem List   Diagnosis Date Noted   Impaired glucose tolerance 03/23/2020   Learning disability 03/23/2020   Urinary and fecal incontinence 06/14/2019   Chronic pain syndrome 03/23/2019   Constipation 03/23/2019   Dysarthria 03/23/2019   Dysphagia 03/23/2019   Learning difficulty 03/23/2019   Morbid obesity (HCC) 03/23/2019   Muscle weakness 03/23/2019   Vitamin D deficiency 03/23/2019   Bilateral carotid artery stenosis 12/15/2018   Moderate aortic valve stenosis 12/15/2018   BMI 45.0-49.9, adult (HCC) 11/10/2018   Cerebrovascular accident (HCC) 05/02/2016   Hemiparesis affecting right side as late effect of cerebrovascular accident (HCC) 05/01/2016   Brain tumor (HCC) 11/24/2014   Gastro-esophageal reflux disease without esophagitis 11/24/2014   Accident due to mechanical fall without injury 08/03/2014   Essential hypertension 06/13/2014   Mixed hyperlipidemia 06/13/2014   Unspecified sequelae of cerebral infarction 06/13/2014   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/2014   Type 2 diabetes mellitus without  complication (HCC) 01/06/2014   Neck mass 09/08/2013   Swimmer's ear 09/08/2013   Erectile dysfunction 01/03/2013   Prediabetes 01/03/2013   ONSET DATE: 05/09/2016  REFERRING DIAG: W09.811 (ICD-10-CM) - Hemiplegia and hemiparesis following cerebral infarction affecting right dominant side   THERAPY DIAG:  Difficulty in walking, not elsewhere classified  Unsteadiness on feet  Other abnormalities of gait and mobility  Abnormality of gait and mobility  Muscle weakness (generalized)  Other lack of coordination  Repeated falls   Rationale for Evaluation and Treatment Rehabilitation  SUBJECTIVE:  SUBJECTIVE STATEMENT  Pt doing well with no new issues. States walking some at home and doing his exercises.  Pt accompanied by: self   PERTINENT HISTORY:   The pt is a pleasant 58 yo male who returns to PT for evaluation of deficits s/o old CVA. Pt with hx of CVA Feb 2018. He was previously fully independent, but CVA caused significant deficits, mainly affecting his right side. Pt reports continued difficulty with walking and transfers. Pt still using WC for majority of mobility, otherwise he uses his RW. Pt lives with his mom who is his main caregiver. Patient had outpatient PT previously in clinic in 2023. He reports since discharge he has been completing his HEP and walking about every day. Pt would like to improve his walking mainly, but also would like to work on balance. He also reports difficulty with transfers. Pt's mom and his caregiver helps him with ADLs. However, he says he is now waiting for a new caregiver. Prior to this pt had caregiver 3x/week. PMH significant for hx of brain tumor, past CVA, DMII and HTN. See chart for additional information.  PAIN:  Are you having pain? Pt reports no pain  currently  PRECAUTIONS: fall  WEIGHT BEARING RESTRICTIONS none  PLOF: required assistance with ADLs (old CVA), prior to CVA in 2018 was indep.  History of Falls: Pt reports no recent falls  Home information: Pt lives with mother who is his primary caregiver. Previously had home aide, 3x/week but reports currently waiting for new one.  PATIENT GOALS: Pt would like to improve his ability to ambulate.  OBJECTIVE:  taken at [facility] unless otherwise noted:   DIAGNOSTIC FINDINGS: No recent imaging per chart  Objective measures taken at eval unless otherwise specified:   Posture: forward head posture, rounded shoulders  MMT LE: grossly  4+/5, exception hip flexors and abductors 4/5 each bilat   Functional tests:  5xSTS:  34 sec, use of BUE (multiple initial attempts with posterior LOB in chair)   : 0.096 m/s with RW, close CGA, and WC follow  Gait for distance (goal 150 ft): deferred due to time  Gait mechanics: impaired, decreased speed (see ), crouched posture, poor postural stability and relies heavily on BUE support on RW. Pt now with bilat AFOS   FOTO: 44 (goal 50)    TODAY'S TREATMENT  07/16/2023  Wearing B LE custom AFOs. PT applied gait belt at start of session, CGA provided throughout session, unless otherwise stated for improved safety   Therapeutic Activities:  Pyramid strengthening:  Standing hip march: 10 reps (no weight); 8 reps (2#AW); 10 reps (4#AW)  Standing hip abd: 10 reps (no weight); 9 reps (2#AW); 10 reps (4#AW) Standing hip ext: 10 reps (no weight); 8 reps (2#AW); 10 reps (4# AW)  Standing mini squat: 10 reps x  3 sets   PATIENT EDUCATION: Education details: Pt educated throughout session about proper posture and technique with exercises. Improved exercise technique, movement at target joints, use of target muscles after min to mod verbal, visual, tactile cues.  Person educated: Patient Education method: Explanation, Demonstration, Tactile  cues, and Verbal cues Education comprehension: verbalized understanding, returned demonstration, and verbal cues required  HOME EXERCISE PROGRAM:  Standing hip flexor stretch at counter, prolonged standing with erect posture for gluteal activation, HS curls with RTB   Added the below HEP on 05/15/23  Access Code: [email or web address] URL: [email or web address] Date: 05/15/2023 Prepared by: Casimiro Needle  Exercises - Seated Long Arc Quad with Ankle Weight  -  1 x daily - 7 x weekly - 2 sets - 10 reps   GOALS: Goals reviewed with patient? No  SHORT TERM GOALS: Target date: 11/12/2022  Pt will be independent with HEP in order to improve strength and balance in order to decrease fall risk and improve function at home. Baseline: instructed in HEP today, to provide printout next visit Goal status: MET    LONG TERM GOALS: Target date: 09/09/2023  Pt will increase FOTO to at least 50 in order to demonstrate improvement in function related to mobility and QoL. Baseline: 44 7/29:48 9/10:40 12/24/2022= 51 Goal status: MET  2.  Pt (< 60 yrs old) will complete 5xSTS test < 30 seconds indicating an increase in LE strength and improved balance. Baseline: 10/01/22 34 sec (multiple initial attempts with posterior LOB in chair) 12/20: 49 seconds 11/04/22:1:06, min A on multiple attempts to maintain balance and pt stays in flexed posture throughout test when coming to standing.   9/10: 27.5 sec, Pushes WC post consistently, close CGA  10/22: 47 seconds first attempt 12/5: 1 minute, 37 seconds, multiple attempts to get first stand, but no difficulty after that; 03/20/2023= 1 min 2 sec with 1UE on walker and 1 on armrest- some posterior lean- Close CGA; 06/17/2023= 38 sec with 1 UE on walker and 1 on armrest Goal status: ONGOING  3.  Pt will increase to > 0.5 m/s (30 sec) as to improve gait speed for better home ambulation and reduce fall risk. Baseline: 10/01/22 0.096 m/s with RW, close CGA, and  WC follow previously 12/27: 0.11 m/s 11/04/22: .056 m/s  9/10: 076 m/s 10/22: 0.64 m/s  11/26: .076 m/s; 03/20/2023= 0.08 m/s; 04/22/2023= 0.089 m/s; 05/15/2023 = 0.079 m/s; 05/27/2023= 0.11 m/s Goal status: PROGRESSING  4.  Pt will increase hip flexor and abductor  gross strength to 4+/5 bilat as to improve functional strength for independent gait, increased standing tolerance, and increased ADL ability. Baseline: 10/01/22 4+/5, exception hip flexors and abductors 4/5 each bilat  (previously 03/27/22: 4+/5 ) 9/10: Hip flexor 4/5, hip abductor 4+/5 10/22: hip flexor 4/5 hip abductor 4+/5 11/26: hip flexor 4+/5 hip abductor 4+/5 Goal status: MET  5.  Pt will ambulate 150 ft with BRW and WC follow with no rest breaks in less than 12 min to allow for improved mobility within home and increased independence. Baseline: 10/03/22 150' with BRW 04/03/22: 105 ft with BRW 7/29: 100 in 9:30 with pediatric RW 9/10:101 ft with BRW stooped posture worsened throughout test 12/24/2022- 150 feet using BRW, CGA with w/c follow- in 10 min 17 sec. Will keep goal to make sure he can do this consistently 10/22: 119 ft with BRW 8 min 23 seconds terminated by patient  11/26: 148 ft with BRW 14 min 34 seconds terminated by patient 03/20/2023= 152 feet using BRW, CGA and w/c follow in 14 min 3 sec- Need to keep goal active to ensure consistency but will revised and add a time element to goal. 04/22/2023= 162 feet in 11 min 54 sec using bariatric RW, CGA and close CGA assist; 05/27/2023- 170 feet without rest break using bariatric RW. 06/17/2023= 160 feet with bariatric 4WW in 9 min- stopped due to fatigue.  Goal status: MET   6. Pt will decrease TUG to below 1 min 30 seconds/decrease in order to demonstrate decreased fall risk. Baseline: 06/17/2023= 2 min using bariatric 4WW-  Goal status: NEW  7. Pt will ambulate 300 ft with BRW, CGA and WC follow  with no rest breaks in less than 20 min to allow for improved mobility within  home/community and increased independence. Baseline: 06/17/2023= 160 feet with bariatric 4WW in 9 min- stopped due to fatigue.  Goal Status: NEW    ASSESSMENT :  CLINICAL IMPRESSION: Treatment focused on Standing and LE movement. He performed really well overall today- able to stand and perform alt LE without any knee buckling and less overall VC for correcting posture. Patient will benefit from continued skilled physical therapy to further progress his strength and balance to decrease fall risk, improve QOL, and attain therapy goals.    OBJECTIVE IMPAIRMENTS Abnormal gait, decreased activity tolerance, decreased balance, decreased coordination, decreased endurance, decreased mobility, difficulty walking, decreased ROM, decreased strength, improper body mechanics, postural dysfunction, and obesity.   ACTIVITY LIMITATIONS carrying, lifting, bending, sitting, standing, squatting, stairs, transfers, bed mobility, continence, bathing, toileting, dressing, hygiene/grooming, and locomotion level  PARTICIPATION LIMITATIONS: meal prep, cleaning, laundry, interpersonal relationship, driving, shopping, community activity, and yard work  PERSONAL FACTORS Fitness, Past/current experiences, Time since onset of injury/illness/exacerbation, and 3+ comorbidities: hx of brain tumor, past CVA, HTN, obesity, learning disability, DMII  are also affecting patient's functional outcome.   REHAB POTENTIAL: Fair    CLINICAL DECISION MAKING: Stable/uncomplicated  EVALUATION COMPLEXITY: Low  PLAN: PT FREQUENCY: 1-2x/week  PT DURATION: 12 weeks  PLANNED INTERVENTIONS: Therapeutic exercises, Therapeutic activity, Neuromuscular re-education, Balance training, Gait training, Patient/Family education, Joint mobilization, Stair training, Vestibular training, Orthotic/Fit training, DME instructions, Dry Needling, Electrical stimulation, Wheelchair mobility training, Cryotherapy, Moist heat, Compression bandaging, Manual  therapy, and Re-evaluation  PLAN FOR NEXT SESSION:     Continue with functional strengthening exercises of the LE's and gait training using RW. Incorporate good technique with all transfers to decrease his risk of falling.     Lenda Kelp, PT Physical Therapist - [place]  [facility]  1:12 PM 07/16/23

## 2023-07-17 ENCOUNTER — Ambulatory Visit: Payer: 59

## 2023-07-17 DIAGNOSIS — R296 Repeated falls: Secondary | ICD-10-CM

## 2023-07-17 DIAGNOSIS — R2681 Unsteadiness on feet: Secondary | ICD-10-CM

## 2023-07-17 DIAGNOSIS — R2689 Other abnormalities of gait and mobility: Secondary | ICD-10-CM

## 2023-07-17 DIAGNOSIS — R262 Difficulty in walking, not elsewhere classified: Secondary | ICD-10-CM | POA: Diagnosis not present

## 2023-07-17 DIAGNOSIS — R278 Other lack of coordination: Secondary | ICD-10-CM

## 2023-07-17 DIAGNOSIS — R269 Unspecified abnormalities of gait and mobility: Secondary | ICD-10-CM

## 2023-07-17 DIAGNOSIS — M6281 Muscle weakness (generalized): Secondary | ICD-10-CM

## 2023-07-18 NOTE — Therapy (Signed)
 OUTPATIENT PHYSICAL THERAPY NEURO TREATMENT/Physical Therapy Progress Note   Dates of reporting period  05/27/2023   to   07/17/2023   Patient Name: Curtis Powell MRN: [id number] DOB:[birth year], 58 y.o., male Today's Date: 07/18/2023  PCP: Enid Baas, MD  REFERRING PROVIDER: Enid Baas, MD    PT End of Session - 07/17/23 0826     Visit Number 70    Number of Visits 87    Date for PT Re-Evaluation 09/09/23    Progress Note Due on Visit 80    PT Start Time 1315    PT Stop Time 1356    PT Time Calculation (min) 41 min    Equipment Utilized During Treatment Gait belt    Activity Tolerance Patient tolerated treatment well    Behavior During Therapy WFL for tasks assessed/performed                    Past Medical History:  Diagnosis Date   Asthma    GERD (gastroesophageal reflux disease)    Hyperlipidemia    Hypertension    Obesity    Stroke [facility])    Past Surgical History:  Procedure Laterality Date   BRAIN SURGERY     Patient Active Problem List   Diagnosis Date Noted   Impaired glucose tolerance 03/23/2020   Learning disability 03/23/2020   Urinary and fecal incontinence 06/14/2019   Chronic pain syndrome 03/23/2019   Constipation 03/23/2019   Dysarthria 03/23/2019   Dysphagia 03/23/2019   Learning difficulty 03/23/2019   Morbid obesity (HCC) 03/23/2019   Muscle weakness 03/23/2019   Vitamin D deficiency 03/23/2019   Bilateral carotid artery stenosis 12/15/2018   Moderate aortic valve stenosis 12/15/2018   BMI 45.0-49.9, adult (HCC) 11/10/2018   Cerebrovascular accident (HCC) 05/02/2016   Hemiparesis affecting right side as late effect of cerebrovascular accident (HCC) 05/01/2016   Brain tumor (HCC) 11/24/2014   Gastro-esophageal reflux disease without esophagitis 11/24/2014   Accident due to mechanical fall without injury 08/03/2014   Essential hypertension 06/13/2014   Mixed hyperlipidemia 06/13/2014   Unspecified sequelae of  cerebral infarction 06/13/2014   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/2014   Type 2 diabetes mellitus without complication (HCC) 01/06/2014   Neck mass 09/08/2013   Swimmer's ear 09/08/2013   Erectile dysfunction 01/03/2013   Prediabetes 01/03/2013   ONSET DATE: 05/09/2016  REFERRING DIAG: J47.829 (ICD-10-CM) - Hemiplegia and hemiparesis following cerebral infarction affecting right dominant side   THERAPY DIAG:  Difficulty in walking, not elsewhere classified  Unsteadiness on feet  Other abnormalities of gait and mobility  Abnormality of gait and mobility  Muscle weakness (generalized)  Other lack of coordination  Repeated falls   Rationale for Evaluation and Treatment Rehabilitation  SUBJECTIVE:  SUBJECTIVE STATEMENT  Pt reports having a good week so far and denies any new issues.  Pt accompanied by: self   PERTINENT HISTORY:   The pt is a pleasant 58 yo male who returns to PT for evaluation of deficits s/o old CVA. Pt with hx of CVA Feb 2018. He was previously fully independent, but CVA caused significant deficits, mainly affecting his right side. Pt reports continued difficulty with walking and transfers. Pt still using WC for majority of mobility, otherwise he uses his RW. Pt lives with his mom who is his main caregiver. Patient had outpatient PT previously in clinic in 2023. He reports since discharge he has been completing his HEP and walking about every day. Pt would like to improve his walking mainly, but also would like to work on balance. He also reports difficulty with transfers. Pt's mom and his caregiver helps him with ADLs. However, he says he is now waiting for a new caregiver. Prior to this pt had caregiver 3x/week. PMH significant for hx of brain tumor, past CVA, DMII and HTN. See chart for  additional information.  PAIN:  Are you having pain? Pt reports no pain currently  PRECAUTIONS: fall  WEIGHT BEARING RESTRICTIONS none  PLOF: required assistance with ADLs (old CVA), prior to CVA in 2018 was indep.  History of Falls: Pt reports no recent falls  Home information: Pt lives with mother who is his primary caregiver. Previously had home aide, 3x/week but reports currently waiting for new one.  PATIENT GOALS: Pt would like to improve his ability to ambulate.  OBJECTIVE:  taken at [facility] unless otherwise noted:   DIAGNOSTIC FINDINGS: No recent imaging per chart  Objective measures taken at eval unless otherwise specified:   Posture: forward head posture, rounded shoulders  MMT LE: grossly  4+/5, exception hip flexors and abductors 4/5 each bilat   Functional tests:  5xSTS:  34 sec, use of BUE (multiple initial attempts with posterior LOB in chair)   : 0.096 m/s with RW, close CGA, and WC follow  Gait for distance (goal 150 ft): deferred due to time  Gait mechanics: impaired, decreased speed (see ), crouched posture, poor postural stability and relies heavily on BUE support on RW. Pt now with bilat AFOS   FOTO: 44 (goal 50)    TODAY'S TREATMENT  07/17/2023  Wearing B LE custom AFOs. PT applied gait belt at start of session, CGA provided throughout session, unless otherwise stated for improved safety   Therex:   Seated hip march - 2 sets x 10 reps Seated knee ext - 2 sets x 10 reps   Therapeutic activities:  Sit to stand x 8 reps from w/c to walker with VC only for hand postion initially and CGA to prevent any posterior lean.  Physical therapy treatment session today consisted of completing assessment of goals and administration of testing as demonstrated and documented in flow sheet, treatment, and goals section of this note. Addition treatments may be found below.    PATIENT EDUCATION: Education details: Pt educated throughout session about  proper posture and technique with exercises. Improved exercise technique, movement at target joints, use of target muscles after min to mod verbal, visual, tactile cues.  Person educated: Patient Education method: Explanation, Demonstration, Tactile cues, and Verbal cues Education comprehension: verbalized understanding, returned demonstration, and verbal cues required  HOME EXERCISE PROGRAM:  Standing hip flexor stretch at counter, prolonged standing with erect posture for gluteal activation, HS curls with RTB   Added  the below HEP on 05/15/23  Access Code: [email or web address] URL: [email or web address] Date: 05/15/2023 Prepared by: Casimiro Needle  Exercises - Seated Long Arc Quad with Ankle Weight  - 1 x daily - 7 x weekly - 2 sets - 10 reps   GOALS: Goals reviewed with patient? No  SHORT TERM GOALS: Target date: 11/12/2022  Pt will be independent with HEP in order to improve strength and balance in order to decrease fall risk and improve function at home. Baseline: instructed in HEP today, to provide printout next visit Goal status: MET    LONG TERM GOALS: Target date: 09/09/2023  Pt will increase FOTO to at least 50 in order to demonstrate improvement in function related to mobility and QoL. Baseline: 44 7/29:48 9/10:40 12/24/2022= 51 Goal status: MET  2.  Pt (< 60 yrs old) will complete 5xSTS test < 30 seconds indicating an increase in LE strength and improved balance. Baseline: 10/01/22 34 sec (multiple initial attempts with posterior LOB in chair) 12/20: 49 seconds 11/04/22:1:06, min A on multiple attempts to maintain balance and pt stays in flexed posture throughout test when coming to standing.   9/10: 27.5 sec, Pushes WC post consistently, close CGA  10/22: 47 seconds first attempt 12/5: 1 minute, 37 seconds, multiple attempts to get first stand, but no difficulty after that; 03/20/2023= 1 min 2 sec with 1UE on walker and 1 on armrest- some posterior lean- Close CGA;  06/17/2023= 38 sec with 1 UE on walker and 1 on armrest Goal status: ONGOING  3.  Pt will increase to > 0.5 m/s (30 sec) as to improve gait speed for better home ambulation and reduce fall risk. Baseline: 10/01/22 0.096 m/s with RW, close CGA, and WC follow previously 12/27: 0.11 m/s 11/04/22: .056 m/s  9/10: 076 m/s 10/22: 0.64 m/s  11/26: .076 m/s; 03/20/2023= 0.08 m/s; 04/22/2023= 0.089 m/s; 05/15/2023 = 0.079 m/s; 05/27/2023= 0.11 m/s; 07/17/2023= 0.15 m/s Goal status: PROGRESSING  4.  Pt will increase hip flexor and abductor  gross strength to 4+/5 bilat as to improve functional strength for independent gait, increased standing tolerance, and increased ADL ability. Baseline: 10/01/22 4+/5, exception hip flexors and abductors 4/5 each bilat  (previously 03/27/22: 4+/5 ) 9/10: Hip flexor 4/5, hip abductor 4+/5 10/22: hip flexor 4/5 hip abductor 4+/5 11/26: hip flexor 4+/5 hip abductor 4+/5 Goal status: MET  5.  Pt will ambulate 150 ft with BRW and WC follow with no rest breaks in less than 12 min to allow for improved mobility within home and increased independence. Baseline: 10/03/22 150' with BRW 04/03/22: 105 ft with BRW 7/29: 100 in 9:30 with pediatric RW 9/10:101 ft with BRW stooped posture worsened throughout test 12/24/2022- 150 feet using BRW, CGA with w/c follow- in 10 min 17 sec. Will keep goal to make sure he can do this consistently 10/22: 119 ft with BRW 8 min 23 seconds terminated by patient  11/26: 148 ft with BRW 14 min 34 seconds terminated by patient 03/20/2023= 152 feet using BRW, CGA and w/c follow in 14 min 3 sec- Need to keep goal active to ensure consistency but will revised and add a time element to goal. 04/22/2023= 162 feet in 11 min 54 sec using bariatric RW, CGA and close CGA assist; 05/27/2023- 170 feet without rest break using bariatric RW. 06/17/2023= 160 feet with bariatric 4WW in 9 min- stopped due to fatigue.  Goal status: MET   6. Pt will decrease TUG to below 1  min  30 seconds/decrease in order to demonstrate decreased fall risk. Baseline: 06/17/2023= 2 min using bariatric 4WW-  Goal status: NEW  7. Pt will ambulate 300 ft with BRW, CGA and WC follow with no rest breaks in less than 20 min to allow for improved mobility within home/community and increased independence. Baseline: 06/17/2023= 160 feet with bariatric 4WW in 9 min- stopped due to fatigue. 07/17/2023- Patient able to walk 270 feet in > 20 min with BRW, CGA and close w/c follow today- VC for posture and for gait sequencing Goal Status: PROGRESSING    ASSESSMENT :  CLINICAL IMPRESSION: Patient presents with good motivation for today's assessment visit. Treatment focused on walking goal mostly today and he had his best day in terms of endurance in a long time. He required less overall cues and was able to significantly improve his gait speed. Patient's condition has the potential to improve in response to therapy. Maximum improvement is yet to be obtained. The anticipated improvement is attainable and reasonable in a generally predictable time.  Patient will benefit from continued skilled physical therapy to further progress his strength and balance to decrease fall risk, improve QOL, and attain therapy goals.    OBJECTIVE IMPAIRMENTS Abnormal gait, decreased activity tolerance, decreased balance, decreased coordination, decreased endurance, decreased mobility, difficulty walking, decreased ROM, decreased strength, improper body mechanics, postural dysfunction, and obesity.   ACTIVITY LIMITATIONS carrying, lifting, bending, sitting, standing, squatting, stairs, transfers, bed mobility, continence, bathing, toileting, dressing, hygiene/grooming, and locomotion level  PARTICIPATION LIMITATIONS: meal prep, cleaning, laundry, interpersonal relationship, driving, shopping, community activity, and yard work  PERSONAL FACTORS Fitness, Past/current experiences, Time since onset of  injury/illness/exacerbation, and 3+ comorbidities: hx of brain tumor, past CVA, HTN, obesity, learning disability, DMII  are also affecting patient's functional outcome.   REHAB POTENTIAL: Fair    CLINICAL DECISION MAKING: Stable/uncomplicated  EVALUATION COMPLEXITY: Low  PLAN: PT FREQUENCY: 1-2x/week  PT DURATION: 12 weeks  PLANNED INTERVENTIONS: Therapeutic exercises, Therapeutic activity, Neuromuscular re-education, Balance training, Gait training, Patient/Family education, Joint mobilization, Stair training, Vestibular training, Orthotic/Fit training, DME instructions, Dry Needling, Electrical stimulation, Wheelchair mobility training, Cryotherapy, Moist heat, Compression bandaging, Manual therapy, and Re-evaluation  PLAN FOR NEXT SESSION:     Continue with functional strengthening exercises of the LE's and gait training using RW. Incorporate good technique with all transfers to decrease his risk of falling.     Lenda Kelp, PT Physical Therapist - [facility]  [facility]  8:42 AM 07/18/23

## 2023-07-21 NOTE — Therapy (Signed)
 OUTPATIENT PHYSICAL THERAPY NEURO TREATMENT  Patient Name: Curtis Powell MRN: [id number] DOB:1965/07/01, 58 y.o., male Today's Date: 07/22/2023  PCP: Curtis Castor, MD  REFERRING PROVIDER: Rex Castor, MD    PT End of Session - 07/22/23 1309     Visit Number 71    Number of Visits 87    Date for PT Re-Evaluation 09/09/23    Progress Note Due on Visit 80    PT Start Time 1315    PT Stop Time 1356    PT Time Calculation (min) 41 min    Equipment Utilized During Treatment Gait belt    Activity Tolerance Patient tolerated treatment well    Behavior During Therapy WFL for tasks assessed/performed             Past Medical History:  Diagnosis Date   Asthma    GERD (gastroesophageal reflux disease)    Hyperlipidemia    Hypertension    Obesity    Stroke [facility])    Past Surgical History:  Procedure Laterality Date   BRAIN SURGERY     Patient Active Problem List   Diagnosis Date Noted   Impaired glucose tolerance 03/23/2020   Learning disability 03/23/2020   Urinary and fecal incontinence 06/14/2019   Chronic pain syndrome 03/23/2019   Constipation 03/23/2019   Dysarthria 03/23/2019   Dysphagia 03/23/2019   Learning difficulty 03/23/2019   Morbid obesity (HCC) 03/23/2019   Muscle weakness 03/23/2019   Vitamin D deficiency 03/23/2019   Bilateral carotid artery stenosis 12/15/2018   Moderate aortic valve stenosis 12/15/2018   BMI 45.0-49.9, adult (HCC) 11/10/2018   Cerebrovascular accident (HCC) 05/02/2016   Hemiparesis affecting right side as late effect of cerebrovascular accident (HCC) 05/01/2016   Brain tumor (HCC) 11/24/2014   Gastro-esophageal reflux disease without esophagitis 11/24/2014   Accident due to mechanical fall without injury 08/03/2014   Essential hypertension 06/13/2014   Mixed hyperlipidemia 06/13/2014   Unspecified sequelae of cerebral infarction 06/13/2014   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/2014   Type 2 diabetes mellitus without complication  (HCC) 01/06/2014   Neck mass 09/08/2013   Swimmer's ear 09/08/2013   Erectile dysfunction 01/03/2013   Prediabetes 01/03/2013   ONSET DATE: 05/09/2016  REFERRING DIAG: B14.782 (ICD-10-CM) - Hemiplegia and hemiparesis following cerebral infarction affecting right dominant side   THERAPY DIAG:  Difficulty in walking, not elsewhere classified  Unsteadiness on feet  Other abnormalities of gait and mobility  Abnormality of gait and mobility  Muscle weakness (generalized)   Rationale for Evaluation and Treatment Rehabilitation  SUBJECTIVE:  SUBJECTIVE STATEMENT   Pt reports having a good week so far and denies any new issues.  Pt accompanied by: self   PERTINENT HISTORY:   The pt is a pleasant 58 yo male who returns to PT for evaluation of deficits s/o old CVA. Pt with hx of CVA Feb 2018. He was previously fully independent, but CVA caused significant deficits, mainly affecting his right side. Pt reports continued difficulty with walking and transfers. Pt still using WC for majority of mobility, otherwise he uses his RW. Pt lives with his mom who is his main caregiver. Patient had outpatient PT previously in clinic in 2023. He reports since discharge he has been completing his HEP and walking about every day. Pt would like to improve his walking mainly, but also would like to work on balance. He also reports difficulty with transfers. Pt's mom and his caregiver helps him with ADLs. However, he says he is now waiting for a new caregiver. Prior to this pt had caregiver 3x/week. PMH significant for hx of brain tumor, past CVA, DMII and HTN. See chart for additional information.  PAIN:  Are you having pain? Pt reports no pain currently  PRECAUTIONS: fall  WEIGHT BEARING RESTRICTIONS none  PLOF:  required assistance with ADLs (old CVA), prior to CVA in 2018 was indep.  History of Falls: Pt reports no recent falls  Home information: Pt lives with mother who is his primary caregiver. Previously had home aide, 3x/week but reports currently waiting for new one.  PATIENT GOALS: Pt would like to improve his ability to ambulate.  OBJECTIVE:  taken at [facility] unless otherwise noted:   DIAGNOSTIC FINDINGS: No recent imaging per chart  Objective measures taken at eval unless otherwise specified:   Posture: forward head posture, rounded shoulders  MMT LE: grossly  4+/5, exception hip flexors and abductors 4/5 each bilat   Functional tests:  5xSTS:  34 sec, use of BUE (multiple initial attempts with posterior LOB in chair)   : 0.096 m/s with RW, close CGA, and WC follow  Gait for distance (goal 150 ft): deferred due to time  Gait mechanics: impaired, decreased speed (see ), crouched posture, poor postural stability and relies heavily on BUE support on RW. Pt now with bilat AFOS   FOTO: 44 (goal 50)    TODAY'S TREATMENT  07/22/23    Wearing B LE custom AFOs. PT applied gait belt at start of session, CGA provided throughout session, unless otherwise stated for improved safety   TA:  Ambulation with bariatric RW 2 x 148' with chair follow with cues for upright posture and RW proximity.   Seated rest break between ambulation bouts   Seated marching 2 x 10  Sit to stand x 8 from w/c to RW with cues for hand placement initially - minA to prevent posterior LOB  Seated knee ext 2 x 10 each LE     PATIENT EDUCATION: Education details: Pt educated throughout session about proper posture and technique with exercises. Improved exercise technique, movement at target joints, use of target muscles after min to mod verbal, visual, tactile cues.  Person educated: Patient Education method: Explanation, Demonstration, Tactile cues, and Verbal cues Education comprehension:  verbalized understanding, returned demonstration, and verbal cues required  HOME EXERCISE PROGRAM:  Standing hip flexor stretch at counter, prolonged standing with erect posture for gluteal activation, HS curls with RTB   Added the below HEP on 05/15/23  Access Code: [email or web address] URL: [email or web address] Date: 05/15/2023 Prepared by: Luther Saltness  Pippin  Exercises - Seated Long Arc Quad with Ankle Weight  - 1 x daily - 7 x weekly - 2 sets - 10 reps   GOALS: Goals reviewed with patient? No  SHORT TERM GOALS: Target date: 11/12/2022  Pt will be independent with HEP in order to improve strength and balance in order to decrease fall risk and improve function at home. Baseline: instructed in HEP today, to provide printout next visit Goal status: MET    LONG TERM GOALS: Target date: 09/09/2023  Pt will increase FOTO to at least 50 in order to demonstrate improvement in function related to mobility and QoL. Baseline: 44 7/29:48 9/10:40 12/24/2022= 51 Goal status: MET  2.  Pt (< 60 yrs old) will complete 5xSTS test < 30 seconds indicating an increase in LE strength and improved balance. Baseline: 10/01/22 34 sec (multiple initial attempts with posterior LOB in chair) 12/20: 49 seconds 11/04/22:1:06, min A on multiple attempts to maintain balance and pt stays in flexed posture throughout test when coming to standing.   9/10: 27.5 sec, Pushes WC post consistently, close CGA  10/22: 47 seconds first attempt 12/5: 1 minute, 37 seconds, multiple attempts to get first stand, but no difficulty after that; 03/20/2023= 1 min 2 sec with 1UE on Jeriel Vivanco and 1 on armrest- some posterior lean- Close CGA; 06/17/2023= 38 sec with 1 UE on Epimenio Schetter and 1 on armrest Goal status: ONGOING  3.  Pt will increase to > 0.5 m/s (30 sec) as to improve gait speed for better home ambulation and reduce fall risk. Baseline: 10/01/22 0.096 m/s with RW, close CGA, and WC follow previously 12/27: 0.11 m/s 11/04/22:  .056 m/s  9/10: 076 m/s 10/22: 0.64 m/s  11/26: .076 m/s; 03/20/2023= 0.08 m/s; 04/22/2023= 0.089 m/s; 05/15/2023 = 0.079 m/s; 05/27/2023= 0.11 m/s; 07/17/2023= 0.15 m/s Goal status: PROGRESSING  4.  Pt will increase hip flexor and abductor  gross strength to 4+/5 bilat as to improve functional strength for independent gait, increased standing tolerance, and increased ADL ability. Baseline: 10/01/22 4+/5, exception hip flexors and abductors 4/5 each bilat  (previously 03/27/22: 4+/5 ) 9/10: Hip flexor 4/5, hip abductor 4+/5 10/22: hip flexor 4/5 hip abductor 4+/5 11/26: hip flexor 4+/5 hip abductor 4+/5 Goal status: MET  5.  Pt will ambulate 150 ft with BRW and WC follow with no rest breaks in less than 12 min to allow for improved mobility within home and increased independence. Baseline: 10/03/22 150' with BRW 04/03/22: 105 ft with BRW 7/29: 100 in 9:30 with pediatric RW 9/10:101 ft with BRW stooped posture worsened throughout test 12/24/2022- 150 feet using BRW, CGA with w/c follow- in 10 min 17 sec. Will keep goal to make sure he can do this consistently 10/22: 119 ft with BRW 8 min 23 seconds terminated by patient  11/26: 148 ft with BRW 14 min 34 seconds terminated by patient 03/20/2023= 152 feet using BRW, CGA and w/c follow in 14 min 3 sec- Need to keep goal active to ensure consistency but will revised and add a time element to goal. 04/22/2023= 162 feet in 11 min 54 sec using bariatric RW, CGA and close CGA assist; 05/27/2023- 170 feet without rest break using bariatric RW. 06/17/2023= 160 feet with bariatric 4WW in 9 min- stopped due to fatigue.  Goal status: MET   6. Pt will decrease TUG to below 1 min 30 seconds/decrease in order to demonstrate decreased fall risk. Baseline: 06/17/2023= 2 min using bariatric 4WW-  Goal status: NEW  7. Pt will ambulate 300 ft with BRW, CGA and WC follow with no rest breaks in less than 20 min to allow for improved mobility within home/community and increased  independence. Baseline: 06/17/2023= 160 feet with bariatric 4WW in 9 min- stopped due to fatigue. 07/17/2023- Patient able to walk 270 feet in > 20 min with BRW, CGA and close w/c follow today- VC for posture and for gait sequencing Goal Status: PROGRESSING    ASSESSMENT :  CLINICAL IMPRESSION:   Patient presents with good motivation for today's treatment visit. Session focused on ambulation with BRW to improve activity tolerance and BLE strengthening. Patient will benefit from continued skilled physical therapy to further progress his strength and balance to decrease fall risk, improve QOL, and attain therapy goals.    OBJECTIVE IMPAIRMENTS Abnormal gait, decreased activity tolerance, decreased balance, decreased coordination, decreased endurance, decreased mobility, difficulty walking, decreased ROM, decreased strength, improper body mechanics, postural dysfunction, and obesity.   ACTIVITY LIMITATIONS carrying, lifting, bending, sitting, standing, squatting, stairs, transfers, bed mobility, continence, bathing, toileting, dressing, hygiene/grooming, and locomotion level  PARTICIPATION LIMITATIONS: meal prep, cleaning, laundry, interpersonal relationship, driving, shopping, community activity, and yard work  PERSONAL FACTORS Fitness, Past/current experiences, Time since onset of injury/illness/exacerbation, and 3+ comorbidities: hx of brain tumor, past CVA, HTN, obesity, learning disability, DMII  are also affecting patient's functional outcome.   REHAB POTENTIAL: Fair    CLINICAL DECISION MAKING: Stable/uncomplicated  EVALUATION COMPLEXITY: Low  PLAN: PT FREQUENCY: 1-2x/week  PT DURATION: 12 weeks  PLANNED INTERVENTIONS: Therapeutic exercises, Therapeutic activity, Neuromuscular re-education, Balance training, Gait training, Patient/Family education, Joint mobilization, Stair training, Vestibular training, Orthotic/Fit training, DME instructions, Dry Needling, Electrical stimulation,  Wheelchair mobility training, Cryotherapy, Moist heat, Compression bandaging, Manual therapy, and Re-evaluation  PLAN FOR NEXT SESSION:     Continue with functional strengthening exercises of the LE's and gait training using RW. Incorporate good technique with all transfers to decrease his risk of falling.     Glenys Lapine, PT, DPT  Physical Therapist - [place]  [facility]  1:10 PM 07/22/23

## 2023-07-22 ENCOUNTER — Ambulatory Visit: Payer: 59

## 2023-07-22 DIAGNOSIS — R2681 Unsteadiness on feet: Secondary | ICD-10-CM

## 2023-07-22 DIAGNOSIS — M6281 Muscle weakness (generalized): Secondary | ICD-10-CM

## 2023-07-22 DIAGNOSIS — R262 Difficulty in walking, not elsewhere classified: Secondary | ICD-10-CM

## 2023-07-22 DIAGNOSIS — R269 Unspecified abnormalities of gait and mobility: Secondary | ICD-10-CM

## 2023-07-22 DIAGNOSIS — R2689 Other abnormalities of gait and mobility: Secondary | ICD-10-CM

## 2023-07-24 ENCOUNTER — Ambulatory Visit: Payer: 59

## 2023-07-24 DIAGNOSIS — R2681 Unsteadiness on feet: Secondary | ICD-10-CM

## 2023-07-24 DIAGNOSIS — R262 Difficulty in walking, not elsewhere classified: Secondary | ICD-10-CM | POA: Diagnosis not present

## 2023-07-24 DIAGNOSIS — R2689 Other abnormalities of gait and mobility: Secondary | ICD-10-CM

## 2023-07-24 DIAGNOSIS — M6281 Muscle weakness (generalized): Secondary | ICD-10-CM

## 2023-07-24 DIAGNOSIS — R278 Other lack of coordination: Secondary | ICD-10-CM

## 2023-07-24 DIAGNOSIS — R269 Unspecified abnormalities of gait and mobility: Secondary | ICD-10-CM

## 2023-07-24 DIAGNOSIS — R296 Repeated falls: Secondary | ICD-10-CM

## 2023-07-24 NOTE — Therapy (Signed)
 OUTPATIENT PHYSICAL THERAPY NEURO TREATMENT  Patient Name: Curtis Powell MRN: [id number] DOB:[birth year], 58 y.o., male Today's Date: 07/24/2023  PCP: Rex Castor, MD  REFERRING PROVIDER: Rex Castor, MD    PT End of Session - 07/24/23 1323     Visit Number 72    Number of Visits 87    Date for PT Re-Evaluation 09/09/23    Progress Note Due on Visit 80    PT Start Time 1315    PT Stop Time 1400    PT Time Calculation (min) 45 min    Equipment Utilized During Treatment Gait belt    Activity Tolerance Patient tolerated treatment well    Behavior During Therapy WFL for tasks assessed/performed              Past Medical History:  Diagnosis Date   Asthma    GERD (gastroesophageal reflux disease)    Hyperlipidemia    Hypertension    Obesity    Stroke [facility])    Past Surgical History:  Procedure Laterality Date   BRAIN SURGERY     Patient Active Problem List   Diagnosis Date Noted   Impaired glucose tolerance 03/23/2020   Learning disability 03/23/2020   Urinary and fecal incontinence 06/14/2019   Chronic pain syndrome 03/23/2019   Constipation 03/23/2019   Dysarthria 03/23/2019   Dysphagia 03/23/2019   Learning difficulty 03/23/2019   Morbid obesity (HCC) 03/23/2019   Muscle weakness 03/23/2019   Vitamin D deficiency 03/23/2019   Bilateral carotid artery stenosis 12/15/2018   Moderate aortic valve stenosis 12/15/2018   BMI 45.0-49.9, adult (HCC) 11/10/2018   Cerebrovascular accident (HCC) 05/02/2016   Hemiparesis affecting right side as late effect of cerebrovascular accident (HCC) 05/01/2016   Brain tumor (HCC) 11/24/2014   Gastro-esophageal reflux disease without esophagitis 11/24/2014   Accident due to mechanical fall without injury 08/03/2014   Essential hypertension 06/13/2014   Mixed hyperlipidemia 06/13/2014   Unspecified sequelae of cerebral infarction 06/13/2014   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/2014   Type 2 diabetes mellitus without complication  (HCC) 01/06/2014   Neck mass 09/08/2013   Swimmer's ear 09/08/2013   Erectile dysfunction 01/03/2013   Prediabetes 01/03/2013   ONSET DATE: 05/09/2016  REFERRING DIAG: N82.956 (ICD-10-CM) - Hemiplegia and hemiparesis following cerebral infarction affecting right dominant side   THERAPY DIAG:  Difficulty in walking, not elsewhere classified  Unsteadiness on feet  Other abnormalities of gait and mobility  Abnormality of gait and mobility  Muscle weakness (generalized)  Other lack of coordination  Repeated falls   Rationale for Evaluation and Treatment Rehabilitation  SUBJECTIVE:  SUBJECTIVE STATEMENT   Pt reports he walked pretty good last time.  Pt accompanied by: self   PERTINENT HISTORY:   The pt is a pleasant 58 yo male who returns to PT for evaluation of deficits s/o old CVA. Pt with hx of CVA Feb 2018. He was previously fully independent, but CVA caused significant deficits, mainly affecting his right side. Pt reports continued difficulty with walking and transfers. Pt still using WC for majority of mobility, otherwise he uses his RW. Pt lives with his mom who is his main caregiver. Patient had outpatient PT previously in clinic in 2023. He reports since discharge he has been completing his HEP and walking about every day. Pt would like to improve his walking mainly, but also would like to work on balance. He also reports difficulty with transfers. Pt's mom and his caregiver helps him with ADLs. However, he says he is now waiting for a new caregiver. Prior to this pt had caregiver 3x/week. PMH significant for hx of brain tumor, past CVA, DMII and HTN. See chart for additional information.  PAIN:  Are you having pain? Pt reports no pain currently  PRECAUTIONS: fall  WEIGHT BEARING  RESTRICTIONS none  PLOF: required assistance with ADLs (old CVA), prior to CVA in 2018 was indep.  History of Falls: Pt reports no recent falls  Home information: Pt lives with mother who is his primary caregiver. Previously had home aide, 3x/week but reports currently waiting for new one.  PATIENT GOALS: Pt would like to improve his ability to ambulate.  OBJECTIVE:  taken at [facility] unless otherwise noted:   DIAGNOSTIC FINDINGS: No recent imaging per chart  Objective measures taken at eval unless otherwise specified:   Posture: forward head posture, rounded shoulders  MMT LE: grossly  4+/5, exception hip flexors and abductors 4/5 each bilat   Functional tests:  5xSTS:  34 sec, use of BUE (multiple initial attempts with posterior LOB in chair)   : 0.096 m/s with RW, close CGA, and WC follow  Gait for distance (goal 150 ft): deferred due to time  Gait mechanics: impaired, decreased speed (see ), crouched posture, poor postural stability and relies heavily on BUE support on RW. Pt now with bilat AFOS   FOTO: 44 (goal 50)    TODAY'S TREATMENT  07/24/23    Wearing B LE custom AFOs. PT applied gait belt at start of session, CGA provided throughout session, unless otherwise stated for improved safety   TA:  Seated marching x 15 reps Alt LE Seated knee ext x 15 reps alt LE    Ambulation with bariatric RW  150' with chair follow with cues for upright posture and Step length, CGA with use of gait belt. *Increased time required to walk and increased VC for step and stride length.   Ambulation in clinic using bariatric RW- 12 feet forward then 12 feet backward- Difficulty with retro steps today- using mostly step to gait- requiring increased VC to    Sit to stand x 5 from w/c to RW with cues for hand placement initially - minA to prevent posterior LOB     PATIENT EDUCATION: Education details: Pt educated throughout session about proper posture and technique with  exercises. Improved exercise technique, movement at target joints, use of target muscles after min to mod verbal, visual, tactile cues.  Person educated: Patient Education method: Explanation, Demonstration, Tactile cues, and Verbal cues Education comprehension: verbalized understanding, returned demonstration, and verbal cues required  HOME EXERCISE PROGRAM:  Standing hip flexor stretch at counter, prolonged standing with erect posture for gluteal activation, HS curls with RTB   Added the below HEP on 05/15/23  Access Code: [email or web address] URL: [email or web address] Date: 05/15/2023 Prepared by: Casimiro Needle  Exercises - Seated Long Arc Quad with Ankle Weight  - 1 x daily - 7 x weekly - 2 sets - 10 reps   GOALS: Goals reviewed with patient? No  SHORT TERM GOALS: Target date: 11/12/2022  Pt will be independent with HEP in order to improve strength and balance in order to decrease fall risk and improve function at home. Baseline: instructed in HEP today, to provide printout next visit Goal status: MET    LONG TERM GOALS: Target date: 09/09/2023  Pt will increase FOTO to at least 50 in order to demonstrate improvement in function related to mobility and QoL. Baseline: 44 7/29:48 9/10:40 12/24/2022= 51 Goal status: MET  2.  Pt (< 60 yrs old) will complete 5xSTS test < 30 seconds indicating an increase in LE strength and improved balance. Baseline: 10/01/22 34 sec (multiple initial attempts with posterior LOB in chair) 12/20: 49 seconds 11/04/22:1:06, min A on multiple attempts to maintain balance and pt stays in flexed posture throughout test when coming to standing.   9/10: 27.5 sec, Pushes WC post consistently, close CGA  10/22: 47 seconds first attempt 12/5: 1 minute, 37 seconds, multiple attempts to get first stand, but no difficulty after that; 03/20/2023= 1 min 2 sec with 1UE on walker and 1 on armrest- some posterior lean- Close CGA; 06/17/2023= 38 sec with 1 UE on walker  and 1 on armrest Goal status: ONGOING  3.  Pt will increase to > 0.5 m/s (30 sec) as to improve gait speed for better home ambulation and reduce fall risk. Baseline: 10/01/22 0.096 m/s with RW, close CGA, and WC follow previously 12/27: 0.11 m/s 11/04/22: .056 m/s  9/10: 076 m/s 10/22: 0.64 m/s  11/26: .076 m/s; 03/20/2023= 0.08 m/s; 04/22/2023= 0.089 m/s; 05/15/2023 = 0.079 m/s; 05/27/2023= 0.11 m/s; 07/17/2023= 0.15 m/s Goal status: PROGRESSING  4.  Pt will increase hip flexor and abductor  gross strength to 4+/5 bilat as to improve functional strength for independent gait, increased standing tolerance, and increased ADL ability. Baseline: 10/01/22 4+/5, exception hip flexors and abductors 4/5 each bilat  (previously 03/27/22: 4+/5 ) 9/10: Hip flexor 4/5, hip abductor 4+/5 10/22: hip flexor 4/5 hip abductor 4+/5 11/26: hip flexor 4+/5 hip abductor 4+/5 Goal status: MET  5.  Pt will ambulate 150 ft with BRW and WC follow with no rest breaks in less than 12 min to allow for improved mobility within home and increased independence. Baseline: 10/03/22 150' with BRW 04/03/22: 105 ft with BRW 7/29: 100 in 9:30 with pediatric RW 9/10:101 ft with BRW stooped posture worsened throughout test 12/24/2022- 150 feet using BRW, CGA with w/c follow- in 10 min 17 sec. Will keep goal to make sure he can do this consistently 10/22: 119 ft with BRW 8 min 23 seconds terminated by patient  11/26: 148 ft with BRW 14 min 34 seconds terminated by patient 03/20/2023= 152 feet using BRW, CGA and w/c follow in 14 min 3 sec- Need to keep goal active to ensure consistency but will revised and add a time element to goal. 04/22/2023= 162 feet in 11 min 54 sec using bariatric RW, CGA and close CGA assist; 05/27/2023- 170 feet without rest break using bariatric RW. 06/17/2023= 160 feet with bariatric 4WW in 9  min- stopped due to fatigue.  Goal status: MET   6. Pt will decrease TUG to below 1 min 30 seconds/decrease in order to  demonstrate decreased fall risk. Baseline: 06/17/2023= 2 min using bariatric 4WW-  Goal status: NEW  7. Pt will ambulate 300 ft with BRW, CGA and WC follow with no rest breaks in less than 20 min to allow for improved mobility within home/community and increased independence. Baseline: 06/17/2023= 160 feet with bariatric 4WW in 9 min- stopped due to fatigue. 07/17/2023- Patient able to walk 270 feet in > 20 min with BRW, CGA and close w/c follow today- VC for posture and for gait sequencing Goal Status: PROGRESSING    ASSESSMENT :  CLINICAL IMPRESSION:   Patient presents with increased overall fatigue vs. Previous visit - decreased gait speed and more cues for maintaining posture and for increased step length.  He initially reported feeling okay but later admitted that he was more tired than he realized. He was also less responsive to [facility] for retro steps and although stating he heard the author in terms of Cues/commands -was unable to increase step length due to fatigue.  Patient will benefit from continued skilled physical therapy to further progress his strength and balance to decrease fall risk, improve QOL, and attain therapy goals.    OBJECTIVE IMPAIRMENTS Abnormal gait, decreased activity tolerance, decreased balance, decreased coordination, decreased endurance, decreased mobility, difficulty walking, decreased ROM, decreased strength, improper body mechanics, postural dysfunction, and obesity.   ACTIVITY LIMITATIONS carrying, lifting, bending, sitting, standing, squatting, stairs, transfers, bed mobility, continence, bathing, toileting, dressing, hygiene/grooming, and locomotion level  PARTICIPATION LIMITATIONS: meal prep, cleaning, laundry, interpersonal relationship, driving, shopping, community activity, and yard work  PERSONAL FACTORS Fitness, Past/current experiences, Time since onset of injury/illness/exacerbation, and 3+ comorbidities: hx of brain tumor, past CVA, HTN, obesity,  learning disability, DMII  are also affecting patient's functional outcome.   REHAB POTENTIAL: Fair    CLINICAL DECISION MAKING: Stable/uncomplicated  EVALUATION COMPLEXITY: Low  PLAN: PT FREQUENCY: 1-2x/week  PT DURATION: 12 weeks  PLANNED INTERVENTIONS: Therapeutic exercises, Therapeutic activity, Neuromuscular re-education, Balance training, Gait training, Patient/Family education, Joint mobilization, Stair training, Vestibular training, Orthotic/Fit training, DME instructions, Dry Needling, Electrical stimulation, Wheelchair mobility training, Cryotherapy, Moist heat, Compression bandaging, Manual therapy, and Re-evaluation  PLAN FOR NEXT SESSION:     Continue with functional strengthening exercises of the LE's and gait training using RW. Incorporate good technique with all transfers to decrease his risk of falling.     Murlene Army, PT Physical Therapist - [place]  [facility]  2:12 PM 07/24/23

## 2023-07-29 ENCOUNTER — Ambulatory Visit: Payer: 59

## 2023-07-29 DIAGNOSIS — R269 Unspecified abnormalities of gait and mobility: Secondary | ICD-10-CM

## 2023-07-29 DIAGNOSIS — R2681 Unsteadiness on feet: Secondary | ICD-10-CM

## 2023-07-29 DIAGNOSIS — R262 Difficulty in walking, not elsewhere classified: Secondary | ICD-10-CM | POA: Diagnosis not present

## 2023-07-29 DIAGNOSIS — R2689 Other abnormalities of gait and mobility: Secondary | ICD-10-CM

## 2023-07-29 DIAGNOSIS — M6281 Muscle weakness (generalized): Secondary | ICD-10-CM

## 2023-07-29 DIAGNOSIS — R278 Other lack of coordination: Secondary | ICD-10-CM

## 2023-07-29 NOTE — Therapy (Signed)
 OUTPATIENT PHYSICAL THERAPY NEURO TREATMENT  Patient Name: Curtis Powell MRN: [id number] DOB:1965-06-23, 58 y.o., male Today's Date: 07/29/2023  PCP: Rex Castor, MD  REFERRING PROVIDER: Rex Castor, MD    PT End of Session - 07/29/23 1310     Visit Number 73    Number of Visits 87    Date for PT Re-Evaluation 09/09/23    Progress Note Due on Visit 80    PT Start Time 1313    PT Stop Time 1355    PT Time Calculation (min) 42 min    Equipment Utilized During Treatment Gait belt    Activity Tolerance Patient tolerated treatment well    Behavior During Therapy WFL for tasks assessed/performed               Past Medical History:  Diagnosis Date   Asthma    GERD (gastroesophageal reflux disease)    Hyperlipidemia    Hypertension    Obesity    Stroke [facility])    Past Surgical History:  Procedure Laterality Date   BRAIN SURGERY     Patient Active Problem List   Diagnosis Date Noted   Impaired glucose tolerance 03/23/2020   Learning disability 03/23/2020   Urinary and fecal incontinence 06/14/2019   Chronic pain syndrome 03/23/2019   Constipation 03/23/2019   Dysarthria 03/23/2019   Dysphagia 03/23/2019   Learning difficulty 03/23/2019   Morbid obesity (HCC) 03/23/2019   Muscle weakness 03/23/2019   Vitamin D deficiency 03/23/2019   Bilateral carotid artery stenosis 12/15/2018   Moderate aortic valve stenosis 12/15/2018   BMI 45.0-49.9, adult (HCC) 11/10/2018   Cerebrovascular accident (HCC) 05/02/2016   Hemiparesis affecting right side as late effect of cerebrovascular accident (HCC) 05/01/2016   Brain tumor (HCC) 11/24/2014   Gastro-esophageal reflux disease without esophagitis 11/24/2014   Accident due to mechanical fall without injury 08/03/2014   Essential hypertension 06/13/2014   Mixed hyperlipidemia 06/13/2014   Unspecified sequelae of cerebral infarction 06/13/2014   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/2014   Type 2 diabetes mellitus without  complication (HCC) 01/06/2014   Neck mass 09/08/2013   Swimmer's ear 09/08/2013   Erectile dysfunction 01/03/2013   Prediabetes 01/03/2013   ONSET DATE: 05/09/2016  REFERRING DIAG: Q65.784 (ICD-10-CM) - Hemiplegia and hemiparesis following cerebral infarction affecting right dominant side   THERAPY DIAG:  Difficulty in walking, not elsewhere classified  Unsteadiness on feet  Other abnormalities of gait and mobility  Abnormality of gait and mobility  Muscle weakness (generalized)  Other lack of coordination   Rationale for Evaluation and Treatment Rehabilitation  SUBJECTIVE:  SUBJECTIVE STATEMENT   Patient reports no updates since last visit.   Pt accompanied by: self   PERTINENT HISTORY:   The pt is a pleasant 58 yo male who returns to PT for evaluation of deficits s/o old CVA. Pt with hx of CVA Feb 2018. He was previously fully independent, but CVA caused significant deficits, mainly affecting his right side. Pt reports continued difficulty with walking and transfers. Pt still using WC for majority of mobility, otherwise he uses his RW. Pt lives with his mom who is his main caregiver. Patient had outpatient PT previously in clinic in 2023. He reports since discharge he has been completing his HEP and walking about every day. Pt would like to improve his walking mainly, but also would like to work on balance. He also reports difficulty with transfers. Pt's mom and his caregiver helps him with ADLs. However, he says he is now waiting for a new caregiver. Prior to this pt had caregiver 3x/week. PMH significant for hx of brain tumor, past CVA, DMII and HTN. See chart for additional information.  PAIN:  Are you having pain? Pt reports no pain currently  PRECAUTIONS: fall  WEIGHT BEARING  RESTRICTIONS none  PLOF: required assistance with ADLs (old CVA), prior to CVA in 2018 was indep.  History of Falls: Pt reports no recent falls  Home information: Pt lives with mother who is his primary caregiver. Previously had home aide, 3x/week but reports currently waiting for new one.  PATIENT GOALS: Pt would like to improve his ability to ambulate.  OBJECTIVE:  taken at [facility] unless otherwise noted:   DIAGNOSTIC FINDINGS: No recent imaging per chart  Objective measures taken at eval unless otherwise specified:   Posture: forward head posture, rounded shoulders  MMT LE: grossly  4+/5, exception hip flexors and abductors 4/5 each bilat   Functional tests:  5xSTS:  34 sec, use of BUE (multiple initial attempts with posterior LOB in chair)   : 0.096 m/s with RW, close CGA, and WC follow  Gait for distance (goal 150 ft): deferred due to time  Gait mechanics: impaired, decreased speed (see ), crouched posture, poor postural stability and relies heavily on BUE support on RW. Pt now with bilat AFOS   FOTO: 44 (goal 50)    TODAY'S TREATMENT  07/29/23    Wearing B LE custom AFOs. PT applied gait belt at start of session, CGA provided throughout session, unless otherwise stated for improved safety   Therapeutic Activities: dynamic therapeutic activities designed to achieve improved functional performance   Standing march alt LE x 15 reps  with BUE Support  Standing - swing leg up/over half foam then back x 10 reps on left side and only 7 on RLE (Patient reports as hard)   Standing step taps onto 1/2 foam roll at support bar with BUE Support x 15 reps each LE (Patient reports as medium to hard)     Sit to stand 2 x 10 from w/c to RW with cues for hand placement initially - minA to prevent posterior LOB   Neuromuscular Re-Education: neuromuscular  reeducation of movement, balance, coordination, kinesthetic  sense, posture and proprioception for sitting and/or  standing   - Static stand at Support bar- without UE Support (VC and tactile Cues for feet placement and posture) x 3 min without rest break and only initial posterior LOB- improved with practice.   -Standing Lumbar flex into lumbar ext x 15 reps (VC for correct posture)  Static standing at support  bar x 5 min with BUE Support focusing on standing as erect as possible  PATIENT EDUCATION: Education details: Pt educated throughout session about proper posture and technique with exercises. Improved exercise technique, movement at target joints, use of target muscles after min to mod verbal, visual, tactile cues.  Person educated: Patient Education method: Explanation, Demonstration, Tactile cues, and Verbal cues Education comprehension: verbalized understanding, returned demonstration, and verbal cues required  HOME EXERCISE PROGRAM:  Standing hip flexor stretch at counter, prolonged standing with erect posture for gluteal activation, HS curls with RTB   Added the below HEP on 05/15/23  Access Code: [email or web address] URL: [email or web address] Date: 05/15/2023 Prepared by: Carlen Chasten  Exercises - Seated Long Arc Quad with Ankle Weight  - 1 x daily - 7 x weekly - 2 sets - 10 reps   GOALS: Goals reviewed with patient? No  SHORT TERM GOALS: Target date: 11/12/2022  Pt will be independent with HEP in order to improve strength and balance in order to decrease fall risk and improve function at home. Baseline: instructed in HEP today, to provide printout next visit Goal status: MET    LONG TERM GOALS: Target date: 09/09/2023  Pt will increase FOTO to at least 50 in order to demonstrate improvement in function related to mobility and QoL. Baseline: 44 7/29:48 9/10:40 12/24/2022= 51 Goal status: MET  2.  Pt (< 60 yrs old) will complete 5xSTS test < 30 seconds indicating an increase in LE strength and improved balance. Baseline: 10/01/22 34 sec (multiple initial attempts with  posterior LOB in chair) 12/20: 49 seconds 11/04/22:1:06, min A on multiple attempts to maintain balance and pt stays in flexed posture throughout test when coming to standing.   9/10: 27.5 sec, Pushes WC post consistently, close CGA  10/22: 47 seconds first attempt 12/5: 1 minute, 37 seconds, multiple attempts to get first stand, but no difficulty after that; 03/20/2023= 1 min 2 sec with 1UE on walker and 1 on armrest- some posterior lean- Close CGA; 06/17/2023= 38 sec with 1 UE on walker and 1 on armrest Goal status: ONGOING  3.  Pt will increase to > 0.5 m/s (30 sec) as to improve gait speed for better home ambulation and reduce fall risk. Baseline: 10/01/22 0.096 m/s with RW, close CGA, and WC follow previously 12/27: 0.11 m/s 11/04/22: .056 m/s  9/10: 076 m/s 10/22: 0.64 m/s  11/26: .076 m/s; 03/20/2023= 0.08 m/s; 04/22/2023= 0.089 m/s; 05/15/2023 = 0.079 m/s; 05/27/2023= 0.11 m/s; 07/17/2023= 0.15 m/s Goal status: PROGRESSING  4.  Pt will increase hip flexor and abductor  gross strength to 4+/5 bilat as to improve functional strength for independent gait, increased standing tolerance, and increased ADL ability. Baseline: 10/01/22 4+/5, exception hip flexors and abductors 4/5 each bilat  (previously 03/27/22: 4+/5 ) 9/10: Hip flexor 4/5, hip abductor 4+/5 10/22: hip flexor 4/5 hip abductor 4+/5 11/26: hip flexor 4+/5 hip abductor 4+/5 Goal status: MET  5.  Pt will ambulate 150 ft with BRW and WC follow with no rest breaks in less than 12 min to allow for improved mobility within home and increased independence. Baseline: 10/03/22 150' with BRW 04/03/22: 105 ft with BRW 7/29: 100 in 9:30 with pediatric RW 9/10:101 ft with BRW stooped posture worsened throughout test 12/24/2022- 150 feet using BRW, CGA with w/c follow- in 10 min 17 sec. Will keep goal to make sure he can do this consistently 10/22: 119 ft with BRW 8 min 23  seconds terminated by patient  11/26: 148 ft with BRW 14 min 34 seconds  terminated by patient 03/20/2023= 152 feet using BRW, CGA and w/c follow in 14 min 3 sec- Need to keep goal active to ensure consistency but will revised and add a time element to goal. 04/22/2023= 162 feet in 11 min 54 sec using bariatric RW, CGA and close CGA assist; 05/27/2023- 170 feet without rest break using bariatric RW. 06/17/2023= 160 feet with bariatric 4WW in 9 min- stopped due to fatigue.  Goal status: MET   6. Pt will decrease TUG to below 1 min 30 seconds/decrease in order to demonstrate decreased fall risk. Baseline: 06/17/2023= 2 min using bariatric 4WW-  Goal status: NEW  7. Pt will ambulate 300 ft with BRW, CGA and WC follow with no rest breaks in less than 20 min to allow for improved mobility within home/community and increased independence. Baseline: 06/17/2023= 160 feet with bariatric 4WW in 9 min- stopped due to fatigue. 07/17/2023- Patient able to walk 270 feet in > 20 min with BRW, CGA and close w/c follow today- VC for posture and for gait sequencing Goal Status: PROGRESSING    ASSESSMENT :  CLINICAL IMPRESSION:   Patient presents with good motivation for today's session. He performed well with standing functional activities- able to stand longer with and later without UE support.  He still struggles with tight Bilateral hip flexors and weak hip and trunk extensors required for optimal erect stand/balance.  Patient will benefit from continued skilled physical therapy to further progress his strength and balance to decrease fall risk, improve QOL, and attain therapy goals.    OBJECTIVE IMPAIRMENTS Abnormal gait, decreased activity tolerance, decreased balance, decreased coordination, decreased endurance, decreased mobility, difficulty walking, decreased ROM, decreased strength, improper body mechanics, postural dysfunction, and obesity.   ACTIVITY LIMITATIONS carrying, lifting, bending, sitting, standing, squatting, stairs, transfers, bed mobility, continence, bathing,  toileting, dressing, hygiene/grooming, and locomotion level  PARTICIPATION LIMITATIONS: meal prep, cleaning, laundry, interpersonal relationship, driving, shopping, community activity, and yard work  PERSONAL FACTORS Fitness, Past/current experiences, Time since onset of injury/illness/exacerbation, and 3+ comorbidities: hx of brain tumor, past CVA, HTN, obesity, learning disability, DMII  are also affecting patient's functional outcome.   REHAB POTENTIAL: Fair    CLINICAL DECISION MAKING: Stable/uncomplicated  EVALUATION COMPLEXITY: Low  PLAN: PT FREQUENCY: 1-2x/week  PT DURATION: 12 weeks  PLANNED INTERVENTIONS: Therapeutic exercises, Therapeutic activity, Neuromuscular re-education, Balance training, Gait training, Patient/Family education, Joint mobilization, Stair training, Vestibular training, Orthotic/Fit training, DME instructions, Dry Needling, Electrical stimulation, Wheelchair mobility training, Cryotherapy, Moist heat, Compression bandaging, Manual therapy, and Re-evaluation  PLAN FOR NEXT SESSION:     Continue with functional strengthening exercises of the LE's and gait training using RW. Incorporate proper technique with all transfers to decrease his risk of falling.     Murlene Army, PT Physical Therapist - [place]  [facility]  1:59 PM 07/29/23

## 2023-07-31 ENCOUNTER — Ambulatory Visit: Payer: 59

## 2023-07-31 DIAGNOSIS — R2681 Unsteadiness on feet: Secondary | ICD-10-CM

## 2023-07-31 DIAGNOSIS — R296 Repeated falls: Secondary | ICD-10-CM

## 2023-07-31 DIAGNOSIS — R278 Other lack of coordination: Secondary | ICD-10-CM

## 2023-07-31 DIAGNOSIS — R2689 Other abnormalities of gait and mobility: Secondary | ICD-10-CM

## 2023-07-31 DIAGNOSIS — R262 Difficulty in walking, not elsewhere classified: Secondary | ICD-10-CM | POA: Diagnosis not present

## 2023-07-31 DIAGNOSIS — R269 Unspecified abnormalities of gait and mobility: Secondary | ICD-10-CM

## 2023-07-31 DIAGNOSIS — M6281 Muscle weakness (generalized): Secondary | ICD-10-CM

## 2023-07-31 NOTE — Therapy (Unsigned)
 OUTPATIENT PHYSICAL THERAPY NEURO TREATMENT  Patient Name: Curtis Powell MRN: [id number] DOB:1965/11/11, 58 y.o., male Today's Date: 08/01/2023  PCP: Rex Castor, MD  REFERRING PROVIDER: Rex Castor, MD    PT End of Session - 07/31/23 1325     Visit Number 74    Number of Visits 87    Date for PT Re-Evaluation 09/09/23    Progress Note Due on Visit 80    PT Start Time 1313    PT Stop Time 1357    PT Time Calculation (min) 44 min    Equipment Utilized During Treatment Gait belt    Activity Tolerance Patient tolerated treatment well    Behavior During Therapy WFL for tasks assessed/performed               Past Medical History:  Diagnosis Date   Asthma    GERD (gastroesophageal reflux disease)    Hyperlipidemia    Hypertension    Obesity    Stroke [facility])    Past Surgical History:  Procedure Laterality Date   BRAIN SURGERY     Patient Active Problem List   Diagnosis Date Noted   Impaired glucose tolerance 03/23/2020   Learning disability 03/23/2020   Urinary and fecal incontinence 06/14/2019   Chronic pain syndrome 03/23/2019   Constipation 03/23/2019   Dysarthria 03/23/2019   Dysphagia 03/23/2019   Learning difficulty 03/23/2019   Morbid obesity (HCC) 03/23/2019   Muscle weakness 03/23/2019   Vitamin D deficiency 03/23/2019   Bilateral carotid artery stenosis 12/15/2018   Moderate aortic valve stenosis 12/15/2018   BMI 45.0-49.9, adult (HCC) 11/10/2018   Cerebrovascular accident (HCC) 05/02/2016   Hemiparesis affecting right side as late effect of cerebrovascular accident (HCC) 05/01/2016   Brain tumor (HCC) 11/24/2014   Gastro-esophageal reflux disease without esophagitis 11/24/2014   Accident due to mechanical fall without injury 08/03/2014   Essential hypertension 06/13/2014   Mixed hyperlipidemia 06/13/2014   Unspecified sequelae of cerebral infarction 06/13/2014   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/2014   Type 2 diabetes mellitus without  complication (HCC) 01/06/2014   Neck mass 09/08/2013   Swimmer's ear 09/08/2013   Erectile dysfunction 01/03/2013   Prediabetes 01/03/2013   ONSET DATE: 05/09/2016  REFERRING DIAG: W09.811 (ICD-10-CM) - Hemiplegia and hemiparesis following cerebral infarction affecting right dominant side   THERAPY DIAG:  Difficulty in walking, not elsewhere classified  Unsteadiness on feet  Other abnormalities of gait and mobility  Abnormality of gait and mobility  Muscle weakness (generalized)  Other lack of coordination  Repeated falls   Rationale for Evaluation and Treatment Rehabilitation  SUBJECTIVE:  SUBJECTIVE STATEMENT   Patient reports having a good week and not too sore after last visit.   Pt accompanied by: self   PERTINENT HISTORY:   The pt is a pleasant 58 yo male who returns to PT for evaluation of deficits s/o old CVA. Pt with hx of CVA Feb 2018. He was previously fully independent, but CVA caused significant deficits, mainly affecting his right side. Pt reports continued difficulty with walking and transfers. Pt still using WC for majority of mobility, otherwise he uses his RW. Pt lives with his mom who is his main caregiver. Patient had outpatient PT previously in clinic in 2023. He reports since discharge he has been completing his HEP and walking about every day. Pt would like to improve his walking mainly, but also would like to work on balance. He also reports difficulty with transfers. Pt's mom and his caregiver helps him with ADLs. However, he says he is now waiting for a new caregiver. Prior to this pt had caregiver 3x/week. PMH significant for hx of brain tumor, past CVA, DMII and HTN. See chart for additional information.  PAIN:  Are you having pain? Pt reports no pain  currently  PRECAUTIONS: fall  WEIGHT BEARING RESTRICTIONS none  PLOF: required assistance with ADLs (old CVA), prior to CVA in 2018 was indep.  History of Falls: Pt reports no recent falls  Home information: Pt lives with mother who is his primary caregiver. Previously had home aide, 3x/week but reports currently waiting for new one.  PATIENT GOALS: Pt would like to improve his ability to ambulate.  OBJECTIVE:  taken at [facility] unless otherwise noted:   DIAGNOSTIC FINDINGS: No recent imaging per chart  Objective measures taken at eval unless otherwise specified:   Posture: forward head posture, rounded shoulders  MMT LE: grossly  4+/5, exception hip flexors and abductors 4/5 each bilat   Functional tests:  5xSTS:  34 sec, use of BUE (multiple initial attempts with posterior LOB in chair)   : 0.096 m/s with RW, close CGA, and WC follow  Gait for distance (goal 150 ft): deferred due to time  Gait mechanics: impaired, decreased speed (see ), crouched posture, poor postural stability and relies heavily on BUE support on RW. Pt now with bilat AFOS   FOTO: 44 (goal 50)    TODAY'S TREATMENT  08/01/23    Wearing B LE custom AFOs. PT applied gait belt at start of session, CGA provided throughout session, unless otherwise stated for improved safety   Therapeutic Activities: dynamic therapeutic activities designed to achieve improved functional performance  *All activities below performed in // bars with CGA, gait belt  Static stand x 3 min with BUE Support using mirror for feedback- able to improve anterior trunk lean with cues from mirror.   Forward/retro walking in // bars x 3- initially requiring 19 steps forward and backwary improved to 15-16 steps on last trial. VC to increase steps  Walking side step in // bars- left to right then back x 2 without rest breaks  Standing march alt LE x 15 reps  with BUE Support  Standing step taps onto 1/2 foam roll at support  bar with BUE Support x 15 reps each LE (Patient reports as medium to hard)     Sit to stand 2 x 10 from w/c to RW with cues for hand placement initially - minA to prevent posterior LOB   Neuromuscular Re-Education: neuromuscular  reeducation of movement, balance, coordination, kinesthetic  sense, posture  and proprioception for sitting and/or standing   - Static stand at Support bar- without UE Support (VC and tactile Cues for feet placement and posture) x 3 min without rest break and only initial posterior LOB- improved with practice.   -Standing Lumbar flex into lumbar ext x 15 reps (VC for correct posture)    Static standing at support  bar x 5 min with BUE Support focusing on standing as erect as possible  PATIENT EDUCATION: Education details: Pt educated throughout session about proper posture and technique with exercises. Improved exercise technique, movement at target joints, use of target muscles after min to mod verbal, visual, tactile cues.  Person educated: Patient Education method: Explanation, Demonstration, Tactile cues, and Verbal cues Education comprehension: verbalized understanding, returned demonstration, and verbal cues required  HOME EXERCISE PROGRAM:  Standing hip flexor stretch at counter, prolonged standing with erect posture for gluteal activation, HS curls with RTB   Added the below HEP on 05/15/23  Access Code: [email or web address] URL: [email or web address] Date: 05/15/2023 Prepared by: Carlen Chasten  Exercises - Seated Long Arc Quad with Ankle Weight  - 1 x daily - 7 x weekly - 2 sets - 10 reps   GOALS: Goals reviewed with patient? No  SHORT TERM GOALS: Target date: 11/12/2022  Pt will be independent with HEP in order to improve strength and balance in order to decrease fall risk and improve function at home. Baseline: instructed in HEP today, to provide printout next visit Goal status: MET    LONG TERM GOALS: Target date: 09/09/2023  Pt will  increase FOTO to at least 50 in order to demonstrate improvement in function related to mobility and QoL. Baseline: 44 7/29:48 9/10:40 12/24/2022= 51 Goal status: MET  2.  Pt (< 60 yrs old) will complete 5xSTS test < 30 seconds indicating an increase in LE strength and improved balance. Baseline: 10/01/22 34 sec (multiple initial attempts with posterior LOB in chair) 12/20: 49 seconds 11/04/22:1:06, min A on multiple attempts to maintain balance and pt stays in flexed posture throughout test when coming to standing.   9/10: 27.5 sec, Pushes WC post consistently, close CGA  10/22: 47 seconds first attempt 12/5: 1 minute, 37 seconds, multiple attempts to get first stand, but no difficulty after that; 03/20/2023= 1 min 2 sec with 1UE on walker and 1 on armrest- some posterior lean- Close CGA; 06/17/2023= 38 sec with 1 UE on walker and 1 on armrest Goal status: ONGOING  3.  Pt will increase to > 0.5 m/s (30 sec) as to improve gait speed for better home ambulation and reduce fall risk. Baseline: 10/01/22 0.096 m/s with RW, close CGA, and WC follow previously 12/27: 0.11 m/s 11/04/22: .056 m/s  9/10: 076 m/s 10/22: 0.64 m/s  11/26: .076 m/s; 03/20/2023= 0.08 m/s; 04/22/2023= 0.089 m/s; 05/15/2023 = 0.079 m/s; 05/27/2023= 0.11 m/s; 07/17/2023= 0.15 m/s Goal status: PROGRESSING  4.  Pt will increase hip flexor and abductor  gross strength to 4+/5 bilat as to improve functional strength for independent gait, increased standing tolerance, and increased ADL ability. Baseline: 10/01/22 4+/5, exception hip flexors and abductors 4/5 each bilat  (previously 03/27/22: 4+/5 ) 9/10: Hip flexor 4/5, hip abductor 4+/5 10/22: hip flexor 4/5 hip abductor 4+/5 11/26: hip flexor 4+/5 hip abductor 4+/5 Goal status: MET  5.  Pt will ambulate 150 ft with BRW and WC follow with no rest breaks in less than 12 min to allow for improved mobility within home and increased independence. Baseline:  10/03/22 150' with BRW 04/03/22:  105 ft with BRW 7/29: 100 in 9:30 with pediatric RW 9/10:101 ft with BRW stooped posture worsened throughout test 12/24/2022- 150 feet using BRW, CGA with w/c follow- in 10 min 17 sec. Will keep goal to make sure he can do this consistently 10/22: 119 ft with BRW 8 min 23 seconds terminated by patient  11/26: 148 ft with BRW 14 min 34 seconds terminated by patient 03/20/2023= 152 feet using BRW, CGA and w/c follow in 14 min 3 sec- Need to keep goal active to ensure consistency but will revised and add a time element to goal. 04/22/2023= 162 feet in 11 min 54 sec using bariatric RW, CGA and close CGA assist; 05/27/2023- 170 feet without rest break using bariatric RW. 06/17/2023= 160 feet with bariatric 4WW in 9 min- stopped due to fatigue.  Goal status: MET   6. Pt will decrease TUG to below 1 min 30 seconds/decrease in order to demonstrate decreased fall risk. Baseline: 06/17/2023= 2 min using bariatric 4WW-  Goal status: NEW  7. Pt will ambulate 300 ft with BRW, CGA and WC follow with no rest breaks in less than 20 min to allow for improved mobility within home/community and increased independence. Baseline: 06/17/2023= 160 feet with bariatric 4WW in 9 min- stopped due to fatigue. 07/17/2023- Patient able to walk 270 feet in > 20 min with BRW, CGA and close w/c follow today- VC for posture and for gait sequencing Goal Status: PROGRESSING    ASSESSMENT :  CLINICAL IMPRESSION:   Patient presents with good motivation for today's session. Patient able to continue improving overall standing endurance while performing dynamic or static activities. He was able to demo improving erect standing with decreased VC overall. He was able to walk as good backward (in terms of step length) as he was forward for the 1st time today indicating improved hip extension ability.  Patient will benefit from continued skilled physical therapy to further progress his strength and balance to decrease fall risk, improve QOL, and  attain therapy goals.    OBJECTIVE IMPAIRMENTS Abnormal gait, decreased activity tolerance, decreased balance, decreased coordination, decreased endurance, decreased mobility, difficulty walking, decreased ROM, decreased strength, improper body mechanics, postural dysfunction, and obesity.   ACTIVITY LIMITATIONS carrying, lifting, bending, sitting, standing, squatting, stairs, transfers, bed mobility, continence, bathing, toileting, dressing, hygiene/grooming, and locomotion level  PARTICIPATION LIMITATIONS: meal prep, cleaning, laundry, interpersonal relationship, driving, shopping, community activity, and yard work  PERSONAL FACTORS Fitness, Past/current experiences, Time since onset of injury/illness/exacerbation, and 3+ comorbidities: hx of brain tumor, past CVA, HTN, obesity, learning disability, DMII  are also affecting patient's functional outcome.   REHAB POTENTIAL: Fair    CLINICAL DECISION MAKING: Stable/uncomplicated  EVALUATION COMPLEXITY: Low  PLAN: PT FREQUENCY: 1-2x/week  PT DURATION: 12 weeks  PLANNED INTERVENTIONS: Therapeutic exercises, Therapeutic activity, Neuromuscular re-education, Balance training, Gait training, Patient/Family education, Joint mobilization, Stair training, Vestibular training, Orthotic/Fit training, DME instructions, Dry Needling, Electrical stimulation, Wheelchair mobility training, Cryotherapy, Moist heat, Compression bandaging, Manual therapy, and Re-evaluation  PLAN FOR NEXT SESSION:     Continue with functional strengthening exercises of the LE's and gait training using RW. Incorporate proper technique with all transfers to decrease his risk of falling.     Murlene Army, PT Physical Therapist - [place]  [facility]  12:01 PM 08/01/23

## 2023-08-05 ENCOUNTER — Ambulatory Visit: Payer: 59

## 2023-08-05 DIAGNOSIS — R262 Difficulty in walking, not elsewhere classified: Secondary | ICD-10-CM | POA: Diagnosis not present

## 2023-08-05 DIAGNOSIS — R278 Other lack of coordination: Secondary | ICD-10-CM

## 2023-08-05 DIAGNOSIS — R2689 Other abnormalities of gait and mobility: Secondary | ICD-10-CM

## 2023-08-05 DIAGNOSIS — R269 Unspecified abnormalities of gait and mobility: Secondary | ICD-10-CM

## 2023-08-05 DIAGNOSIS — R2681 Unsteadiness on feet: Secondary | ICD-10-CM

## 2023-08-05 DIAGNOSIS — M6281 Muscle weakness (generalized): Secondary | ICD-10-CM

## 2023-08-05 NOTE — Therapy (Signed)
 OUTPATIENT PHYSICAL THERAPY NEURO TREATMENT  Patient Name: Curtis Powell MRN: [id number] DOB:11/13/1965, 58 y.o., male Today's Date: 08/05/2023  PCP: Rex Castor, MD  REFERRING PROVIDER: Rex Castor, MD    PT End of Session - 08/05/23 1302     Visit Number 75    Number of Visits 87    Date for PT Re-Evaluation 09/09/23    Progress Note Due on Visit 80    PT Start Time 1311    PT Stop Time 1356    PT Time Calculation (min) 45 min    Equipment Utilized During Treatment Gait belt    Activity Tolerance Patient tolerated treatment well    Behavior During Therapy WFL for tasks assessed/performed               Past Medical History:  Diagnosis Date   Asthma    GERD (gastroesophageal reflux disease)    Hyperlipidemia    Hypertension    Obesity    Stroke [facility])    Past Surgical History:  Procedure Laterality Date   BRAIN SURGERY     Patient Active Problem List   Diagnosis Date Noted   Impaired glucose tolerance 03/23/2020   Learning disability 03/23/2020   Urinary and fecal incontinence 06/14/2019   Chronic pain syndrome 03/23/2019   Constipation 03/23/2019   Dysarthria 03/23/2019   Dysphagia 03/23/2019   Learning difficulty 03/23/2019   Morbid obesity (HCC) 03/23/2019   Muscle weakness 03/23/2019   Vitamin D deficiency 03/23/2019   Bilateral carotid artery stenosis 12/15/2018   Moderate aortic valve stenosis 12/15/2018   BMI 45.0-49.9, adult (HCC) 11/10/2018   Cerebrovascular accident (HCC) 05/02/2016   Hemiparesis affecting right side as late effect of cerebrovascular accident (HCC) 05/01/2016   Brain tumor (HCC) 11/24/2014   Gastro-esophageal reflux disease without esophagitis 11/24/2014   Accident due to mechanical fall without injury 08/03/2014   Essential hypertension 06/13/2014   Mixed hyperlipidemia 06/13/2014   Unspecified sequelae of cerebral infarction 06/13/2014   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/2014   Type 2 diabetes mellitus without  complication (HCC) 01/06/2014   Neck mass 09/08/2013   Swimmer's ear 09/08/2013   Erectile dysfunction 01/03/2013   Prediabetes 01/03/2013   ONSET DATE: 05/09/2016  REFERRING DIAG: G40.102 (ICD-10-CM) - Hemiplegia and hemiparesis following cerebral infarction affecting right dominant side   THERAPY DIAG:  Difficulty in walking, not elsewhere classified  Unsteadiness on feet  Other abnormalities of gait and mobility  Abnormality of gait and mobility  Muscle weakness (generalized)  Other lack of coordination   Rationale for Evaluation and Treatment Rehabilitation  SUBJECTIVE:  SUBJECTIVE STATEMENT   Patient reports no new issues this week. States compliant with trying to work LE and stand more.    Pt accompanied by: self   PERTINENT HISTORY:   The pt is a pleasant 58 yo male who returns to PT for evaluation of deficits s/o old CVA. Pt with hx of CVA Feb 2018. He was previously fully independent, but CVA caused significant deficits, mainly affecting his right side. Pt reports continued difficulty with walking and transfers. Pt still using WC for majority of mobility, otherwise he uses his RW. Pt lives with his mom who is his main caregiver. Patient had outpatient PT previously in clinic in 2023. He reports since discharge he has been completing his HEP and walking about every day. Pt would like to improve his walking mainly, but also would like to work on balance. He also reports difficulty with transfers. Pt's mom and his caregiver helps him with ADLs. However, he says he is now waiting for a new caregiver. Prior to this pt had caregiver 3x/week. PMH significant for hx of brain tumor, past CVA, DMII and HTN. See chart for additional information.  PAIN:  Are you having pain? Pt reports no pain  currently  PRECAUTIONS: fall  WEIGHT BEARING RESTRICTIONS none  PLOF: required assistance with ADLs (old CVA), prior to CVA in 2018 was indep.  History of Falls: Pt reports no recent falls  Home information: Pt lives with mother who is his primary caregiver. Previously had home aide, 3x/week but reports currently waiting for new one.  PATIENT GOALS: Pt would like to improve his ability to ambulate.  OBJECTIVE:  taken at [facility] unless otherwise noted:   DIAGNOSTIC FINDINGS: No recent imaging per chart  Objective measures taken at eval unless otherwise specified:   Posture: forward head posture, rounded shoulders  MMT LE: grossly  4+/5, exception hip flexors and abductors 4/5 each bilat   Functional tests:  5xSTS:  34 sec, use of BUE (multiple initial attempts with posterior LOB in chair)   : 0.096 m/s with RW, close CGA, and WC follow  Gait for distance (goal 150 ft): deferred due to time  Gait mechanics: impaired, decreased speed (see ), crouched posture, poor postural stability and relies heavily on BUE support on RW. Pt now with bilat AFOS   FOTO: 44 (goal 50)    TODAY'S TREATMENT  08/05/23    Wearing B LE custom AFOs. PT applied gait belt at start of session, CGA provided throughout session, unless otherwise stated for improved safety   Therapeutic Exercise: therapeutic exercises to develop  strength and endurance, range of motion and flexibility  -seated hip  -seated knee ext (2x 15 reps ea LE for each of above exericses)   Therapeutic Activities: dynamic therapeutic activities  designed to achieve improved functional performance   Walking in clinic for improved functional mobility - using bariatric RW- 160 feet, CGA and use of gait belt with w/c follow.  Multiple VC for posture and for step length. Patient presented with decreased step length overall - 11 min 32 sec.  Patient performed 2nd trial at 150 feet in 15 min- Slowed significantly down at end  of walk due to fatigue with difficulty clearing RLE      PATIENT EDUCATION: Education details: Pt educated throughout session about proper posture and technique with exercises. Improved exercise technique, movement at target joints, use of target muscles after min to mod verbal, visual, tactile cues.  Person educated: Patient Education method: Explanation, Demonstration,  Tactile cues, and Verbal cues Education comprehension: verbalized understanding, returned demonstration, and verbal cues required  HOME EXERCISE PROGRAM:  Standing hip flexor stretch at counter, prolonged standing with erect posture for gluteal activation, HS curls with RTB   Added the below HEP on 05/15/23  Access Code: [email or web address] URL: [email or web address] Date: 05/15/2023 Prepared by: Carlen Chasten  Exercises - Seated Long Arc Quad with Ankle Weight  - 1 x daily - 7 x weekly - 2 sets - 10 reps   GOALS: Goals reviewed with patient? No  SHORT TERM GOALS: Target date: 11/12/2022  Pt will be independent with HEP in order to improve strength and balance in order to decrease fall risk and improve function at home. Baseline: instructed in HEP today, to provide printout next visit Goal status: MET    LONG TERM GOALS: Target date: 09/09/2023  Pt will increase FOTO to at least 50 in order to demonstrate improvement in function related to mobility and QoL. Baseline: 44 7/29:48 9/10:40 12/24/2022= 51 Goal status: MET  2.  Pt (< 60 yrs old) will complete 5xSTS test < 30 seconds indicating an increase in LE strength and improved balance. Baseline: 10/01/22 34 sec (multiple initial attempts with posterior LOB in chair) 12/20: 49 seconds 11/04/22:1:06, min A on multiple attempts to maintain balance and pt stays in flexed posture throughout test when coming to standing.   9/10: 27.5 sec, Pushes WC post consistently, close CGA  10/22: 47 seconds first attempt 12/5: 1 minute, 37 seconds, multiple attempts to get  first stand, but no difficulty after that; 03/20/2023= 1 min 2 sec with 1UE on walker and 1 on armrest- some posterior lean- Close CGA; 06/17/2023= 38 sec with 1 UE on walker and 1 on armrest Goal status: ONGOING  3.  Pt will increase to > 0.5 m/s (30 sec) as to improve gait speed for better home ambulation and reduce fall risk. Baseline: 10/01/22 0.096 m/s with RW, close CGA, and WC follow previously 12/27: 0.11 m/s 11/04/22: .056 m/s  9/10: 076 m/s 10/22: 0.64 m/s  11/26: .076 m/s; 03/20/2023= 0.08 m/s; 04/22/2023= 0.089 m/s; 05/15/2023 = 0.079 m/s; 05/27/2023= 0.11 m/s; 07/17/2023= 0.15 m/s Goal status: PROGRESSING  4.  Pt will increase hip flexor and abductor  gross strength to 4+/5 bilat as to improve functional strength for independent gait, increased standing tolerance, and increased ADL ability. Baseline: 10/01/22 4+/5, exception hip flexors and abductors 4/5 each bilat  (previously 03/27/22: 4+/5 ) 9/10: Hip flexor 4/5, hip abductor 4+/5 10/22: hip flexor 4/5 hip abductor 4+/5 11/26: hip flexor 4+/5 hip abductor 4+/5 Goal status: MET  5.  Pt will ambulate 150 ft with BRW and WC follow with no rest breaks in less than 12 min to allow for improved mobility within home and increased independence. Baseline: 10/03/22 150' with BRW 04/03/22: 105 ft with BRW 7/29: 100 in 9:30 with pediatric RW 9/10:101 ft with BRW stooped posture worsened throughout test 12/24/2022- 150 feet using BRW, CGA with w/c follow- in 10 min 17 sec. Will keep goal to make sure he can do this consistently 10/22: 119 ft with BRW 8 min 23 seconds terminated by patient  11/26: 148 ft with BRW 14 min 34 seconds terminated by patient 03/20/2023= 152 feet using BRW, CGA and w/c follow in 14 min 3 sec- Need to keep goal active to ensure consistency but will revised and add a time element to goal. 04/22/2023= 162 feet in 11 min 54 sec using bariatric RW, CGA and  close CGA assist; 05/27/2023- 170 feet without rest break using  bariatric RW. 06/17/2023= 160 feet with bariatric 4WW in 9 min- stopped due to fatigue.  Goal status: MET   6. Pt will decrease TUG to below 1 min 30 seconds/decrease in order to demonstrate decreased fall risk. Baseline: 06/17/2023= 2 min using bariatric 4WW-  Goal status: NEW  7. Pt will ambulate 300 ft with BRW, CGA and WC follow with no rest breaks in less than 20 min to allow for improved mobility within home/community and increased independence. Baseline: 06/17/2023= 160 feet with bariatric 4WW in 9 min- stopped due to fatigue. 07/17/2023- Patient able to walk 270 feet in > 20 min with BRW, CGA and close w/c follow today- VC for posture and for gait sequencing Goal Status: PROGRESSING    ASSESSMENT :  CLINICAL IMPRESSION:   Treatment focused on quality of walking with verbal cues and efficiency of gait. To date, patient continues to make progress including more erect stand, faster overall gait speed and improved distances with maximal effort. On 2nd trial he walked till he could not advance his RLE any further exhibiting great motivation to perform his best.  Patient will benefit from continued skilled physical therapy to further progress his strength and balance to decrease fall risk, improve QOL, and attain therapy goals.    OBJECTIVE IMPAIRMENTS Abnormal gait, decreased activity tolerance, decreased balance, decreased coordination, decreased endurance, decreased mobility, difficulty walking, decreased ROM, decreased strength, improper body mechanics, postural dysfunction, and obesity.   ACTIVITY LIMITATIONS carrying, lifting, bending, sitting, standing, squatting, stairs, transfers, bed mobility, continence, bathing, toileting, dressing, hygiene/grooming, and locomotion level  PARTICIPATION LIMITATIONS: meal prep, cleaning, laundry, interpersonal relationship, driving, shopping, community activity, and yard work  PERSONAL FACTORS Fitness, Past/current experiences, Time since onset of  injury/illness/exacerbation, and 3+ comorbidities: hx of brain tumor, past CVA, HTN, obesity, learning disability, DMII  are also affecting patient's functional outcome.   REHAB POTENTIAL: Fair    CLINICAL DECISION MAKING: Stable/uncomplicated  EVALUATION COMPLEXITY: Low  PLAN: PT FREQUENCY: 1-2x/week  PT DURATION: 12 weeks  PLANNED INTERVENTIONS: Therapeutic exercises, Therapeutic activity, Neuromuscular re-education, Balance training, Gait training, Patient/Family education, Joint mobilization, Stair training, Vestibular training, Orthotic/Fit training, DME instructions, Dry Needling, Electrical stimulation, Wheelchair mobility training, Cryotherapy, Moist heat, Compression bandaging, Manual therapy, and Re-evaluation  PLAN FOR NEXT SESSION:     Continue with functional strengthening exercises of the LE's and gait training using RW. Incorporate proper technique with all transfers to decrease his risk of falling.     Murlene Army, PT Physical Therapist -   [facility]  10:20 PM 08/05/23

## 2023-08-07 ENCOUNTER — Ambulatory Visit: Payer: 59 | Attending: Internal Medicine

## 2023-08-07 DIAGNOSIS — R2681 Unsteadiness on feet: Secondary | ICD-10-CM | POA: Insufficient documentation

## 2023-08-07 DIAGNOSIS — R269 Unspecified abnormalities of gait and mobility: Secondary | ICD-10-CM | POA: Insufficient documentation

## 2023-08-07 DIAGNOSIS — R2689 Other abnormalities of gait and mobility: Secondary | ICD-10-CM | POA: Diagnosis present

## 2023-08-07 DIAGNOSIS — R262 Difficulty in walking, not elsewhere classified: Secondary | ICD-10-CM | POA: Diagnosis present

## 2023-08-07 DIAGNOSIS — R296 Repeated falls: Secondary | ICD-10-CM | POA: Insufficient documentation

## 2023-08-07 DIAGNOSIS — R278 Other lack of coordination: Secondary | ICD-10-CM | POA: Insufficient documentation

## 2023-08-07 DIAGNOSIS — M6281 Muscle weakness (generalized): Secondary | ICD-10-CM | POA: Insufficient documentation

## 2023-08-07 NOTE — Therapy (Signed)
 OUTPATIENT PHYSICAL THERAPY NEURO TREATMENT  Patient Name: KO KRIDER MRN: [id number] DOB:10-02-1965, 58 y.o., male Today's Date: 08/08/2023  PCP: Rex Castor, MD  REFERRING PROVIDER: Rex Castor, MD    PT End of Session - 08/07/23 1333     Visit Number 76    Number of Visits 87    Date for PT Re-Evaluation 09/09/23    Progress Note Due on Visit 80    PT Start Time 1314    PT Stop Time 1359    PT Time Calculation (min) 45 min    Equipment Utilized During Treatment Gait belt    Activity Tolerance Patient tolerated treatment well    Behavior During Therapy WFL for tasks assessed/performed                Past Medical History:  Diagnosis Date   Asthma    GERD (gastroesophageal reflux disease)    Hyperlipidemia    Hypertension    Obesity    Stroke [facility])    Past Surgical History:  Procedure Laterality Date   BRAIN SURGERY     Patient Active Problem List   Diagnosis Date Noted   Impaired glucose tolerance 03/23/2020   Learning disability 03/23/2020   Urinary and fecal incontinence 06/14/2019   Chronic pain syndrome 03/23/2019   Constipation 03/23/2019   Dysarthria 03/23/2019   Dysphagia 03/23/2019   Learning difficulty 03/23/2019   Morbid obesity (HCC) 03/23/2019   Muscle weakness 03/23/2019   Vitamin D deficiency 03/23/2019   Bilateral carotid artery stenosis 12/15/2018   Moderate aortic valve stenosis 12/15/2018   BMI 45.0-49.9, adult (HCC) 11/10/2018   Cerebrovascular accident (HCC) 05/02/2016   Hemiparesis affecting right side as late effect of cerebrovascular accident (HCC) 05/01/2016   Brain tumor (HCC) 11/24/2014   Gastro-esophageal reflux disease without esophagitis 11/24/2014   Accident due to mechanical fall without injury 08/03/2014   Essential hypertension 06/13/2014   Mixed hyperlipidemia 06/13/2014   Unspecified sequelae of cerebral infarction 06/13/2014   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/2014   Type 2 diabetes mellitus without  complication (HCC) 01/06/2014   Neck mass 09/08/2013   Swimmer's ear 09/08/2013   Erectile dysfunction 01/03/2013   Prediabetes 01/03/2013   ONSET DATE: 05/09/2016  REFERRING DIAG: N82.956 (ICD-10-CM) - Hemiplegia and hemiparesis following cerebral infarction affecting right dominant side   THERAPY DIAG:  Difficulty in walking, not elsewhere classified  Unsteadiness on feet  Other abnormalities of gait and mobility  Abnormality of gait and mobility  Muscle weakness (generalized)  Other lack of coordination  Repeated falls   Rationale for Evaluation and Treatment Rehabilitation  SUBJECTIVE:  SUBJECTIVE STATEMENT   Patient reports he was grateful to have a good session last time. Mother reports he is moving around easier at home. Pt accompanied by: self   PERTINENT HISTORY:   The pt is a pleasant 58 yo male who returns to PT for evaluation of deficits s/o old CVA. Pt with hx of CVA Feb 2018. He was previously fully independent, but CVA caused significant deficits, mainly affecting his right side. Pt reports continued difficulty with walking and transfers. Pt still using WC for majority of mobility, otherwise he uses his RW. Pt lives with his mom who is his main caregiver. Patient had outpatient PT previously in clinic in 2023. He reports since discharge he has been completing his HEP and walking about every day. Pt would like to improve his walking mainly, but also would like to work on balance. He also reports difficulty with transfers. Pt's mom and his caregiver helps him with ADLs. However, he says he is now waiting for a new caregiver. Prior to this pt had caregiver 3x/week. PMH significant for hx of brain tumor, past CVA, DMII and HTN. See chart for additional information.  PAIN:  Are you  having pain? Pt reports no pain currently  PRECAUTIONS: fall  WEIGHT BEARING RESTRICTIONS none  PLOF: required assistance with ADLs (old CVA), prior to CVA in 2018 was indep.  History of Falls: Pt reports no recent falls  Home information: Pt lives with mother who is his primary caregiver. Previously had home aide, 3x/week but reports currently waiting for new one.  PATIENT GOALS: Pt would like to improve his ability to ambulate.  OBJECTIVE:  taken at [facility] unless otherwise noted:   DIAGNOSTIC FINDINGS: No recent imaging per chart  Objective measures taken at eval unless otherwise specified:   Posture: forward head posture, rounded shoulders  MMT LE: grossly  4+/5, exception hip flexors and abductors 4/5 each bilat   Functional tests:  5xSTS:  34 sec, use of BUE (multiple initial attempts with posterior LOB in chair)   : 0.096 m/s with RW, close CGA, and WC follow  Gait for distance (goal 150 ft): deferred due to time  Gait mechanics: impaired, decreased speed (see ), crouched posture, poor postural stability and relies heavily on BUE support on RW. Pt now with bilat AFOS   FOTO: 44 (goal 50)    TODAY'S TREATMENT  08/08/23    Wearing B LE custom AFOs. PT applied gait belt at start of session, CGA provided throughout session, unless otherwise stated for improved safety    Therapeutic Activities: dynamic therapeutic activities  designed to achieve improved functional performance   Static stand at support x 5 min- Intermittent BUE to 1 UE support Blaze pod activity (3) pods- 2 on dry erase board and 1 on floor- random setting x  1 min time- measured hits (pods did not always register even though patient tapped or stepped appropriately) focusing on coordination, posture, balance, standing endurance- followed by 1 min static stand x 10 trials (20 min total)  Blaze pod activity (3) pods- 2 on floor in front of patient in standing at support bar and 1 positioned  slightly above shoulder height in front on dry erase board- Random setting- x 8 with 1 min stand between (16 min)       PATIENT EDUCATION: Education details: Pt educated throughout session about proper posture and technique with exercises. Improved exercise technique, movement at target joints, use of target muscles after min to mod verbal, visual,  tactile cues.  Person educated: Patient Education method: Explanation, Demonstration, Tactile cues, and Verbal cues Education comprehension: verbalized understanding, returned demonstration, and verbal cues required  HOME EXERCISE PROGRAM:  Standing hip flexor stretch at counter, prolonged standing with erect posture for gluteal activation, HS curls with RTB   Added the below HEP on 05/15/23  Access Code: [email or web address] URL: [email or web address] Date: 05/15/2023 Prepared by: Carlen Chasten  Exercises - Seated Long Arc Quad with Ankle Weight  - 1 x daily - 7 x weekly - 2 sets - 10 reps   GOALS: Goals reviewed with patient? No  SHORT TERM GOALS: Target date: 11/12/2022  Pt will be independent with HEP in order to improve strength and balance in order to decrease fall risk and improve function at home. Baseline: instructed in HEP today, to provide printout next visit Goal status: MET    LONG TERM GOALS: Target date: 09/09/2023  Pt will increase FOTO to at least 50 in order to demonstrate improvement in function related to mobility and QoL. Baseline: 44 7/29:48 9/10:40 12/24/2022= 51 Goal status: MET  2.  Pt (< 60 yrs old) will complete 5xSTS test < 30 seconds indicating an increase in LE strength and improved balance. Baseline: 10/01/22 34 sec (multiple initial attempts with posterior LOB in chair) 12/20: 49 seconds 11/04/22:1:06, min A on multiple attempts to maintain balance and pt stays in flexed posture throughout test when coming to standing.   9/10: 27.5 sec, Pushes WC post consistently, close CGA  10/22: 47 seconds first  attempt 12/5: 1 minute, 37 seconds, multiple attempts to get first stand, but no difficulty after that; 03/20/2023= 1 min 2 sec with 1UE on walker and 1 on armrest- some posterior lean- Close CGA; 06/17/2023= 38 sec with 1 UE on walker and 1 on armrest Goal status: ONGOING  3.  Pt will increase to > 0.5 m/s (30 sec) as to improve gait speed for better home ambulation and reduce fall risk. Baseline: 10/01/22 0.096 m/s with RW, close CGA, and WC follow previously 12/27: 0.11 m/s 11/04/22: .056 m/s  9/10: 076 m/s 10/22: 0.64 m/s  11/26: .076 m/s; 03/20/2023= 0.08 m/s; 04/22/2023= 0.089 m/s; 05/15/2023 = 0.079 m/s; 05/27/2023= 0.11 m/s; 07/17/2023= 0.15 m/s Goal status: PROGRESSING  4.  Pt will increase hip flexor and abductor  gross strength to 4+/5 bilat as to improve functional strength for independent gait, increased standing tolerance, and increased ADL ability. Baseline: 10/01/22 4+/5, exception hip flexors and abductors 4/5 each bilat  (previously 03/27/22: 4+/5 ) 9/10: Hip flexor 4/5, hip abductor 4+/5 10/22: hip flexor 4/5 hip abductor 4+/5 11/26: hip flexor 4+/5 hip abductor 4+/5 Goal status: MET  5.  Pt will ambulate 150 ft with BRW and WC follow with no rest breaks in less than 12 min to allow for improved mobility within home and increased independence. Baseline: 10/03/22 150' with BRW 04/03/22: 105 ft with BRW 7/29: 100 in 9:30 with pediatric RW 9/10:101 ft with BRW stooped posture worsened throughout test 12/24/2022- 150 feet using BRW, CGA with w/c follow- in 10 min 17 sec. Will keep goal to make sure he can do this consistently 10/22: 119 ft with BRW 8 min 23 seconds terminated by patient  11/26: 148 ft with BRW 14 min 34 seconds terminated by patient 03/20/2023= 152 feet using BRW, CGA and w/c follow in 14 min 3 sec- Need to keep goal active to ensure consistency but will revised and add a time element to goal. 04/22/2023= 162 feet  in 11 min 54 sec using bariatric RW, CGA and close CGA  assist; 05/27/2023- 170 feet without rest break using bariatric RW. 06/17/2023= 160 feet with bariatric 4WW in 9 min- stopped due to fatigue.  Goal status: MET   6. Pt will decrease TUG to below 1 min 30 seconds/decrease in order to demonstrate decreased fall risk. Baseline: 06/17/2023= 2 min using bariatric 4WW-  Goal status: NEW  7. Pt will ambulate 300 ft with BRW, CGA and WC follow with no rest breaks in less than 20 min to allow for improved mobility within home/community and increased independence. Baseline: 06/17/2023= 160 feet with bariatric 4WW in 9 min- stopped due to fatigue. 07/17/2023- Patient able to walk 270 feet in > 20 min with BRW, CGA and close w/c follow today- VC for posture and for gait sequencing Goal Status: PROGRESSING    ASSESSMENT :  CLINICAL IMPRESSION:   Patient presented with good motivation for today's session. Treatment focused on his overall standing functional abilities- performing dual task such as blaze pods + some static standing. He was able to demonstrate improved overhead reaching with 1 UE support and no knee buckling or LOB. He was very fatigued at end of session but able to stand on his feet > 40 min overall today.  Patient will benefit from continued skilled physical therapy to further progress his strength and balance to decrease fall risk, improve QOL, and attain therapy goals.    OBJECTIVE IMPAIRMENTS Abnormal gait, decreased activity tolerance, decreased balance, decreased coordination, decreased endurance, decreased mobility, difficulty walking, decreased ROM, decreased strength, improper body mechanics, postural dysfunction, and obesity.   ACTIVITY LIMITATIONS carrying, lifting, bending, sitting, standing, squatting, stairs, transfers, bed mobility, continence, bathing, toileting, dressing, hygiene/grooming, and locomotion level  PARTICIPATION LIMITATIONS: meal prep, cleaning, laundry, interpersonal relationship, driving, shopping, community  activity, and yard work  PERSONAL FACTORS Fitness, Past/current experiences, Time since onset of injury/illness/exacerbation, and 3+ comorbidities: hx of brain tumor, past CVA, HTN, obesity, learning disability, DMII  are also affecting patient's functional outcome.   REHAB POTENTIAL: Fair    CLINICAL DECISION MAKING: Stable/uncomplicated  EVALUATION COMPLEXITY: Low  PLAN: PT FREQUENCY: 1-2x/week  PT DURATION: 12 weeks  PLANNED INTERVENTIONS: Therapeutic exercises, Therapeutic activity, Neuromuscular re-education, Balance training, Gait training, Patient/Family education, Joint mobilization, Stair training, Vestibular training, Orthotic/Fit training, DME instructions, Dry Needling, Electrical stimulation, Wheelchair mobility training, Cryotherapy, Moist heat, Compression bandaging, Manual therapy, and Re-evaluation  PLAN FOR NEXT SESSION:     Continue with functional strengthening exercises of the LE's and gait training using RW. Incorporate proper technique with all transfers to decrease his risk of falling.     Murlene Army, PT Physical Therapist - [facility]  [employer]  11:26 AM 08/08/23

## 2023-08-12 ENCOUNTER — Ambulatory Visit: Payer: 59

## 2023-08-12 DIAGNOSIS — R2681 Unsteadiness on feet: Secondary | ICD-10-CM

## 2023-08-12 DIAGNOSIS — R262 Difficulty in walking, not elsewhere classified: Secondary | ICD-10-CM | POA: Diagnosis not present

## 2023-08-12 DIAGNOSIS — R269 Unspecified abnormalities of gait and mobility: Secondary | ICD-10-CM

## 2023-08-12 DIAGNOSIS — M6281 Muscle weakness (generalized): Secondary | ICD-10-CM

## 2023-08-12 DIAGNOSIS — R2689 Other abnormalities of gait and mobility: Secondary | ICD-10-CM

## 2023-08-12 DIAGNOSIS — R296 Repeated falls: Secondary | ICD-10-CM

## 2023-08-12 DIAGNOSIS — R278 Other lack of coordination: Secondary | ICD-10-CM

## 2023-08-12 NOTE — Therapy (Signed)
 OUTPATIENT PHYSICAL THERAPY NEURO TREATMENT  Patient Name: CORWIN JACQUES MRN: [id number] DOB:[birth year], 58 y.o., male Today's Date: 08/13/2023  PCP: Rex Castor, MD  REFERRING PROVIDER: Rex Castor, MD    PT End of Session - 08/12/23 1324     Visit Number 77    Number of Visits 87    Date for PT Re-Evaluation 09/09/23    Progress Note Due on Visit 80    PT Start Time 1316    PT Stop Time 1358    PT Time Calculation (min) 42 min    Equipment Utilized During Treatment Gait belt    Activity Tolerance Patient tolerated treatment well    Behavior During Therapy WFL for tasks assessed/performed                Past Medical History:  Diagnosis Date   Asthma    GERD (gastroesophageal reflux disease)    Hyperlipidemia    Hypertension    Obesity    Stroke [facility])    Past Surgical History:  Procedure Laterality Date   BRAIN SURGERY     Patient Active Problem List   Diagnosis Date Noted   Impaired glucose tolerance 03/23/2020   Learning disability 03/23/2020   Urinary and fecal incontinence 06/14/2019   Chronic pain syndrome 03/23/2019   Constipation 03/23/2019   Dysarthria 03/23/2019   Dysphagia 03/23/2019   Learning difficulty 03/23/2019   Morbid obesity (HCC) 03/23/2019   Muscle weakness 03/23/2019   Vitamin D deficiency 03/23/2019   Bilateral carotid artery stenosis 12/15/2018   Moderate aortic valve stenosis 12/15/2018   BMI 45.0-49.9, adult (HCC) 11/10/2018   Cerebrovascular accident (HCC) 05/02/2016   Hemiparesis affecting right side as late effect of cerebrovascular accident (HCC) 05/01/2016   Brain tumor (HCC) 11/24/2014   Gastro-esophageal reflux disease without esophagitis 11/24/2014   Accident due to mechanical fall without injury 08/03/2014   Essential hypertension 06/13/2014   Mixed hyperlipidemia 06/13/2014   Unspecified sequelae of cerebral infarction 06/13/2014   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/2014   Type 2 diabetes mellitus without  complication (HCC) 01/06/2014   Neck mass 09/08/2013   Swimmer's ear 09/08/2013   Erectile dysfunction 01/03/2013   Prediabetes 01/03/2013   ONSET DATE: 05/09/2016  REFERRING DIAG: G40.102 (ICD-10-CM) - Hemiplegia and hemiparesis following cerebral infarction affecting right dominant side   THERAPY DIAG:  Difficulty in walking, not elsewhere classified  Unsteadiness on feet  Other abnormalities of gait and mobility  Muscle weakness (generalized)  Abnormality of gait and mobility  Other lack of coordination  Repeated falls   Rationale for Evaluation and Treatment Rehabilitation  SUBJECTIVE:  SUBJECTIVE STATEMENT   Patient reports he wants to be able to walk out of his front door onto his front porch.   Pt accompanied by: self   PERTINENT HISTORY:   The pt is a pleasant 58 yo male who returns to PT for evaluation of deficits s/o old CVA. Pt with hx of CVA Feb 2018. He was previously fully independent, but CVA caused significant deficits, mainly affecting his right side. Pt reports continued difficulty with walking and transfers. Pt still using WC for majority of mobility, otherwise he uses his RW. Pt lives with his mom who is his main caregiver. Patient had outpatient PT previously in clinic in 2023. He reports since discharge he has been completing his HEP and walking about every day. Pt would like to improve his walking mainly, but also would like to work on balance. He also reports difficulty with transfers. Pt's mom and his caregiver helps him with ADLs. However, he says he is now waiting for a new caregiver. Prior to this pt had caregiver 3x/week. PMH significant for hx of brain tumor, past CVA, DMII and HTN. See chart for additional information.  PAIN:  Are you having pain? Pt reports no  pain currently  PRECAUTIONS: fall  WEIGHT BEARING RESTRICTIONS none  PLOF: required assistance with ADLs (old CVA), prior to CVA in 2018 was indep.  History of Falls: Pt reports no recent falls  Home information: Pt lives with mother who is his primary caregiver. Previously had home aide, 3x/week but reports currently waiting for new one.  PATIENT GOALS: Pt would like to improve his ability to ambulate.  OBJECTIVE:  taken at [facility] unless otherwise noted:   DIAGNOSTIC FINDINGS: No recent imaging per chart  Objective measures taken at eval unless otherwise specified:   Posture: forward head posture, rounded shoulders  MMT LE: grossly  4+/5, exception hip flexors and abductors 4/5 each bilat   Functional tests:  5xSTS:  34 sec, use of BUE (multiple initial attempts with posterior LOB in chair)   : 0.096 m/s with RW, close CGA, and WC follow  Gait for distance (goal 150 ft): deferred due to time  Gait mechanics: impaired, decreased speed (see ), crouched posture, poor postural stability and relies heavily on BUE support on RW. Pt now with bilat AFOS   FOTO: 44 (goal 50)    TODAY'S TREATMENT  08/13/23    Wearing B LE custom AFOs. PT applied gait belt at start of session, CGA provided throughout session, unless otherwise stated for improved safety    Therapeutic Activities: dynamic therapeutic activities  designed to achieve improved functional performance - performed today at support bar with CGA, use of gait belt, and w/c close.   Dynamic lateral weight shift x 1 min with BUE Support  Stand hip march 2x 10  Stand hip abd 2x 10  Stand hip ext 2 x 10  Standing- minisquats 2 x10  Standing UE overhead reaching 2 x10  Static stand at support x 5 min- Intermittent BUE to 1 UE support        PATIENT EDUCATION: Education details: Pt educated throughout session about proper posture and technique with exercises. Improved exercise technique, movement at target  joints, use of target muscles after min to mod verbal, visual, tactile cues.  Person educated: Patient Education method: Explanation, Demonstration, Tactile cues, and Verbal cues Education comprehension: verbalized understanding, returned demonstration, and verbal cues required  HOME EXERCISE PROGRAM:  Standing hip flexor stretch at counter, prolonged standing with  erect posture for gluteal activation, HS curls with RTB   Added the below HEP on 05/15/23  Access Code: [email or web address] URL: [email or web address] Date: 05/15/2023 Prepared by: Carlen Chasten  Exercises - Seated Long Arc Quad with Ankle Weight  - 1 x daily - 7 x weekly - 2 sets - 10 reps   GOALS: Goals reviewed with patient? No  SHORT TERM GOALS: Target date: 11/12/2022  Pt will be independent with HEP in order to improve strength and balance in order to decrease fall risk and improve function at home. Baseline: instructed in HEP today, to provide printout next visit Goal status: MET    LONG TERM GOALS: Target date: 09/09/2023  Pt will increase FOTO to at least 50 in order to demonstrate improvement in function related to mobility and QoL. Baseline: 44 7/29:48 9/10:40 12/24/2022= 51 Goal status: MET  2.  Pt (< 60 yrs old) will complete 5xSTS test < 30 seconds indicating an increase in LE strength and improved balance. Baseline: 10/01/22 34 sec (multiple initial attempts with posterior LOB in chair) 12/20: 49 seconds 11/04/22:1:06, min A on multiple attempts to maintain balance and pt stays in flexed posture throughout test when coming to standing.   9/10: 27.5 sec, Pushes WC post consistently, close CGA  10/22: 47 seconds first attempt 12/5: 1 minute, 37 seconds, multiple attempts to get first stand, but no difficulty after that; 03/20/2023= 1 min 2 sec with 1UE on walker and 1 on armrest- some posterior lean- Close CGA; 06/17/2023= 38 sec with 1 UE on walker and 1 on armrest Goal status: ONGOING  3.  Pt will  increase to > 0.5 m/s (30 sec) as to improve gait speed for better home ambulation and reduce fall risk. Baseline: 10/01/22 0.096 m/s with RW, close CGA, and WC follow previously 12/27: 0.11 m/s 11/04/22: .056 m/s  9/10: 076 m/s 10/22: 0.64 m/s  11/26: .076 m/s; 03/20/2023= 0.08 m/s; 04/22/2023= 0.089 m/s; 05/15/2023 = 0.079 m/s; 05/27/2023= 0.11 m/s; 07/17/2023= 0.15 m/s Goal status: PROGRESSING  4.  Pt will increase hip flexor and abductor  gross strength to 4+/5 bilat as to improve functional strength for independent gait, increased standing tolerance, and increased ADL ability. Baseline: 10/01/22 4+/5, exception hip flexors and abductors 4/5 each bilat  (previously 03/27/22: 4+/5 ) 9/10: Hip flexor 4/5, hip abductor 4+/5 10/22: hip flexor 4/5 hip abductor 4+/5 11/26: hip flexor 4+/5 hip abductor 4+/5 Goal status: MET  5.  Pt will ambulate 150 ft with BRW and WC follow with no rest breaks in less than 12 min to allow for improved mobility within home and increased independence. Baseline: 10/03/22 150' with BRW 04/03/22: 105 ft with BRW 7/29: 100 in 9:30 with pediatric RW 9/10:101 ft with BRW stooped posture worsened throughout test 12/24/2022- 150 feet using BRW, CGA with w/c follow- in 10 min 17 sec. Will keep goal to make sure he can do this consistently 10/22: 119 ft with BRW 8 min 23 seconds terminated by patient  11/26: 148 ft with BRW 14 min 34 seconds terminated by patient 03/20/2023= 152 feet using BRW, CGA and w/c follow in 14 min 3 sec- Need to keep goal active to ensure consistency but will revised and add a time element to goal. 04/22/2023= 162 feet in 11 min 54 sec using bariatric RW, CGA and close CGA assist; 05/27/2023- 170 feet without rest break using bariatric RW. 06/17/2023= 160 feet with bariatric 4WW in 9 min- stopped due to fatigue.  Goal status: MET  6. Pt will decrease TUG to below 1 min 30 seconds/decrease in order to demonstrate decreased fall risk. Baseline: 06/17/2023=  2 min using bariatric 4WW-  Goal status: NEW  7. Pt will ambulate 300 ft with BRW, CGA and WC follow with no rest breaks in less than 20 min to allow for improved mobility within home/community and increased independence. Baseline: 06/17/2023= 160 feet with bariatric 4WW in 9 min- stopped due to fatigue. 07/17/2023- Patient able to walk 270 feet in > 20 min with BRW, CGA and close w/c follow today- VC for posture and for gait sequencing Goal Status: PROGRESSING    ASSESSMENT :  CLINICAL IMPRESSION:    Treatment continues to focus on LE strengthening in standing position without compromising posture. Patient continues to work hard and able to progress standing without knee buckling. He was more confident with overhead reaching and able to increase height of march and hip ext with only verbal cues today.  Patient will benefit from continued skilled physical therapy to further progress his strength and balance to decrease fall risk, improve QOL, and attain therapy goals.    OBJECTIVE IMPAIRMENTS Abnormal gait, decreased activity tolerance, decreased balance, decreased coordination, decreased endurance, decreased mobility, difficulty walking, decreased ROM, decreased strength, improper body mechanics, postural dysfunction, and obesity.   ACTIVITY LIMITATIONS carrying, lifting, bending, sitting, standing, squatting, stairs, transfers, bed mobility, continence, bathing, toileting, dressing, hygiene/grooming, and locomotion level  PARTICIPATION LIMITATIONS: meal prep, cleaning, laundry, interpersonal relationship, driving, shopping, community activity, and yard work  PERSONAL FACTORS Fitness, Past/current experiences, Time since onset of injury/illness/exacerbation, and 3+ comorbidities: hx of brain tumor, past CVA, HTN, obesity, learning disability, DMII  are also affecting patient's functional outcome.   REHAB POTENTIAL: Fair    CLINICAL DECISION MAKING: Stable/uncomplicated  EVALUATION  COMPLEXITY: Low  PLAN: PT FREQUENCY: 1-2x/week  PT DURATION: 12 weeks  PLANNED INTERVENTIONS: Therapeutic exercises, Therapeutic activity, Neuromuscular re-education, Balance training, Gait training, Patient/Family education, Joint mobilization, Stair training, Vestibular training, Orthotic/Fit training, DME instructions, Dry Needling, Electrical stimulation, Wheelchair mobility training, Cryotherapy, Moist heat, Compression bandaging, Manual therapy, and Re-evaluation  PLAN FOR NEXT SESSION:     Continue with functional strengthening exercises of the LE's and gait training using RW. Incorporate proper technique with all transfers to decrease his risk of falling.     Murlene Army, PT Physical Therapist - [place]  [facility]  4:47 PM 08/13/23

## 2023-08-14 ENCOUNTER — Ambulatory Visit: Payer: 59

## 2023-08-14 DIAGNOSIS — R262 Difficulty in walking, not elsewhere classified: Secondary | ICD-10-CM

## 2023-08-14 DIAGNOSIS — R2689 Other abnormalities of gait and mobility: Secondary | ICD-10-CM

## 2023-08-14 DIAGNOSIS — R269 Unspecified abnormalities of gait and mobility: Secondary | ICD-10-CM

## 2023-08-14 DIAGNOSIS — R278 Other lack of coordination: Secondary | ICD-10-CM

## 2023-08-14 DIAGNOSIS — M6281 Muscle weakness (generalized): Secondary | ICD-10-CM

## 2023-08-14 DIAGNOSIS — R2681 Unsteadiness on feet: Secondary | ICD-10-CM

## 2023-08-14 DIAGNOSIS — R296 Repeated falls: Secondary | ICD-10-CM

## 2023-08-14 NOTE — Therapy (Signed)
 OUTPATIENT PHYSICAL THERAPY NEURO TREATMENT  Patient Name: Curtis Powell MRN: [id number] DOB:09/21/1965, 59 y.o., male Today's Date: 08/14/2023  PCP: Rex Castor, MD  REFERRING PROVIDER: Rex Castor, MD    PT End of Session - 08/14/23 1314     Visit Number 78    Number of Visits 87    Date for PT Re-Evaluation 09/09/23    Progress Note Due on Visit 80    PT Start Time 1315    PT Stop Time 1353    PT Time Calculation (min) 38 min    Equipment Utilized During Treatment Gait belt    Activity Tolerance Patient tolerated treatment well    Behavior During Therapy WFL for tasks assessed/performed                Past Medical History:  Diagnosis Date   Asthma    GERD (gastroesophageal reflux disease)    Hyperlipidemia    Hypertension    Obesity    Stroke [facility])    Past Surgical History:  Procedure Laterality Date   BRAIN SURGERY     Patient Active Problem List   Diagnosis Date Noted   Impaired glucose tolerance 03/23/2020   Learning disability 03/23/2020   Urinary and fecal incontinence 06/14/2019   Chronic pain syndrome 03/23/2019   Constipation 03/23/2019   Dysarthria 03/23/2019   Dysphagia 03/23/2019   Learning difficulty 03/23/2019   Morbid obesity (HCC) 03/23/2019   Muscle weakness 03/23/2019   Vitamin D deficiency 03/23/2019   Bilateral carotid artery stenosis 12/15/2018   Moderate aortic valve stenosis 12/15/2018   BMI 45.0-49.9, adult (HCC) 11/10/2018   Cerebrovascular accident (HCC) 05/02/2016   Hemiparesis affecting right side as late effect of cerebrovascular accident (HCC) 05/01/2016   Brain tumor (HCC) 11/24/2014   Gastro-esophageal reflux disease without esophagitis 11/24/2014   Accident due to mechanical fall without injury 08/03/2014   Essential hypertension 06/13/2014   Mixed hyperlipidemia 06/13/2014   Unspecified sequelae of cerebral infarction 06/13/2014   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/2014   Type 2 diabetes mellitus without  complication (HCC) 01/06/2014   Neck mass 09/08/2013   Swimmer's ear 09/08/2013   Erectile dysfunction 01/03/2013   Prediabetes 01/03/2013   ONSET DATE: 05/09/2016  REFERRING DIAG: W29.562 (ICD-10-CM) - Hemiplegia and hemiparesis following cerebral infarction affecting right dominant side   THERAPY DIAG:  Difficulty in walking, not elsewhere classified  Unsteadiness on feet  Other abnormalities of gait and mobility  Muscle weakness (generalized)  Abnormality of gait and mobility  Other lack of coordination  Repeated falls   Rationale for Evaluation and Treatment Rehabilitation  SUBJECTIVE:  SUBJECTIVE STATEMENT   Patient reports he was fatigued from last session and mostly took it easy yesterday. States feeling good today and no residual soreness.   Pt accompanied by: self   PERTINENT HISTORY:   The pt is a pleasant 59 yo male who returns to PT for evaluation of deficits s/o old CVA. Pt with hx of CVA Feb 2018. He was previously fully independent, but CVA caused significant deficits, mainly affecting his right side. Pt reports continued difficulty with walking and transfers. Pt still using WC for majority of mobility, otherwise he uses his RW. Pt lives with his mom who is his main caregiver. Patient had outpatient PT previously in clinic in 2023. He reports since discharge he has been completing his HEP and walking about every day. Pt would like to improve his walking mainly, but also would like to work on balance. He also reports difficulty with transfers. Pt's mom and his caregiver helps him with ADLs. However, he says he is now waiting for a new caregiver. Prior to this pt had caregiver 3x/week. PMH significant for hx of brain tumor, past CVA, DMII and HTN. See chart for additional  information.  PAIN:  Are you having pain? Pt reports no pain currently  PRECAUTIONS: fall  WEIGHT BEARING RESTRICTIONS none  PLOF: required assistance with ADLs (old CVA), prior to CVA in 2018 was indep.  History of Falls: Pt reports no recent falls  Home information: Pt lives with mother who is his primary caregiver. Previously had home aide, 3x/week but reports currently waiting for new one.  PATIENT GOALS: Pt would like to improve his ability to ambulate.  OBJECTIVE:  taken at [facility] unless otherwise noted:   DIAGNOSTIC FINDINGS: No recent imaging per chart  Objective measures taken at eval unless otherwise specified:   Posture: forward head posture, rounded shoulders  MMT LE: grossly  4+/5, exception hip flexors and abductors 4/5 each bilat   Functional tests:  5xSTS:  34 sec, use of BUE (multiple initial attempts with posterior LOB in chair)   : 0.096 m/s with RW, close CGA, and WC follow  Gait for distance (goal 150 ft): deferred due to time  Gait mechanics: impaired, decreased speed (see ), crouched posture, poor postural stability and relies heavily on BUE support on RW. Pt now with bilat AFOS   FOTO: 44 (goal 50)    TODAY'S TREATMENT  08/14/23    Wearing B LE custom AFOs. PT applied gait belt at start of session, CGA provided throughout session, unless otherwise stated for improved safety    Therapeutic Activities: dynamic therapeutic activities  designed to achieve improved functional performance - performed today with CGA, use of gait belt, and w/c close.   Dynamic lateral weight shift x 1 min with BUE Support  Dynamic hip march 2x 10  Dynamic knee ext (focusing on lifting LE while leg rest off w/c) 2 x 10 reps  Dynamic walking using bariatric RW - focusing on taking longer step length with more continuous motion of walker and maintain erect posture- 190 feet followed by another 80 feet- Gait speed during 39M of 1st walk= 0.59m/s         PATIENT EDUCATION: Education details: Pt educated throughout session about proper posture and technique with exercises. Improved exercise technique, movement at target joints, use of target muscles after min to mod verbal, visual, tactile cues.  Person educated: Patient Education method: Explanation, Demonstration, Tactile cues, and Verbal cues Education comprehension: verbalized understanding, returned demonstration, and  verbal cues required  HOME EXERCISE PROGRAM:  Standing hip flexor stretch at counter, prolonged standing with erect posture for gluteal activation, HS curls with RTB   Added the below HEP on 05/15/23  Access Code: [email or web address] URL: [email or web address] Date: 05/15/2023 Prepared by: Carlen Chasten  Exercises - Seated Long Arc Quad with Ankle Weight  - 1 x daily - 7 x weekly - 2 sets - 10 reps   GOALS: Goals reviewed with patient? No  SHORT TERM GOALS: Target date: 11/12/2022  Pt will be independent with HEP in order to improve strength and balance in order to decrease fall risk and improve function at home. Baseline: instructed in HEP today, to provide printout next visit Goal status: MET    LONG TERM GOALS: Target date: 09/09/2023  Pt will increase FOTO to at least 50 in order to demonstrate improvement in function related to mobility and QoL. Baseline: 44 7/29:48 9/10:40 12/24/2022= 51 Goal status: MET  2.  Pt (< 60 yrs old) will complete 5xSTS test < 30 seconds indicating an increase in LE strength and improved balance. Baseline: 10/01/22 34 sec (multiple initial attempts with posterior LOB in chair) 12/20: 49 seconds 11/04/22:1:06, min A on multiple attempts to maintain balance and pt stays in flexed posture throughout test when coming to standing.   9/10: 27.5 sec, Pushes WC post consistently, close CGA  10/22: 47 seconds first attempt 12/5: 1 minute, 37 seconds, multiple attempts to get first stand, but no difficulty after that;  03/20/2023= 1 min 2 sec with 1UE on walker and 1 on armrest- some posterior lean- Close CGA; 06/17/2023= 38 sec with 1 UE on walker and 1 on armrest Goal status: ONGOING  3.  Pt will increase to > 0.5 m/s (30 sec) as to improve gait speed for better home ambulation and reduce fall risk. Baseline: 10/01/22 0.096 m/s with RW, close CGA, and WC follow previously 12/27: 0.11 m/s 11/04/22: .056 m/s  9/10: 076 m/s 10/22: 0.64 m/s  11/26: .076 m/s; 03/20/2023= 0.08 m/s; 04/22/2023= 0.089 m/s; 05/15/2023 = 0.079 m/s; 05/27/2023= 0.11 m/s; 07/17/2023= 0.15 m/s Goal status: PROGRESSING  4.  Pt will increase hip flexor and abductor  gross strength to 4+/5 bilat as to improve functional strength for independent gait, increased standing tolerance, and increased ADL ability. Baseline: 10/01/22 4+/5, exception hip flexors and abductors 4/5 each bilat  (previously 03/27/22: 4+/5 ) 9/10: Hip flexor 4/5, hip abductor 4+/5 10/22: hip flexor 4/5 hip abductor 4+/5 11/26: hip flexor 4+/5 hip abductor 4+/5 Goal status: MET  5.  Pt will ambulate 150 ft with BRW and WC follow with no rest breaks in less than 12 min to allow for improved mobility within home and increased independence. Baseline: 10/03/22 150' with BRW 04/03/22: 105 ft with BRW 7/29: 100 in 9:30 with pediatric RW 9/10:101 ft with BRW stooped posture worsened throughout test 12/24/2022- 150 feet using BRW, CGA with w/c follow- in 10 min 17 sec. Will keep goal to make sure he can do this consistently 10/22: 119 ft with BRW 8 min 23 seconds terminated by patient  11/26: 148 ft with BRW 14 min 34 seconds terminated by patient 03/20/2023= 152 feet using BRW, CGA and w/c follow in 14 min 3 sec- Need to keep goal active to ensure consistency but will revised and add a time element to goal. 04/22/2023= 162 feet in 11 min 54 sec using bariatric RW, CGA and close CGA assist; 05/27/2023- 170 feet without rest break using bariatric RW.  06/17/2023= 160 feet with bariatric  4WW in 9 min- stopped due to fatigue.  Goal status: MET   6. Pt will decrease TUG to below 1 min 30 seconds/decrease in order to demonstrate decreased fall risk. Baseline: 06/17/2023= 2 min using bariatric 4WW-  Goal status: NEW  7. Pt will ambulate 300 ft with BRW, CGA and WC follow with no rest breaks in less than 20 min to allow for improved mobility within home/community and increased independence. Baseline: 06/17/2023= 160 feet with bariatric 4WW in 9 min- stopped due to fatigue. 07/17/2023- Patient able to walk 270 feet in > 20 min with BRW, CGA and close w/c follow today- VC for posture and for gait sequencing Goal Status: PROGRESSING    ASSESSMENT :  CLINICAL IMPRESSION:    Treatment focused on endurance walking and patient demonstrated some progress but still limited overall by fatigue. He was only intermittent with taking larger step upon command and gait speed slightly slower overall today. He is still making good progress overall and continues to push himself to stand erect and try to bear weight through his LE as best he can. Patient will benefit from continued skilled physical therapy to further progress his strength and balance to decrease fall risk, improve QOL, and attain therapy goals.    OBJECTIVE IMPAIRMENTS Abnormal gait, decreased activity tolerance, decreased balance, decreased coordination, decreased endurance, decreased mobility, difficulty walking, decreased ROM, decreased strength, improper body mechanics, postural dysfunction, and obesity.   ACTIVITY LIMITATIONS carrying, lifting, bending, sitting, standing, squatting, stairs, transfers, bed mobility, continence, bathing, toileting, dressing, hygiene/grooming, and locomotion level  PARTICIPATION LIMITATIONS: meal prep, cleaning, laundry, interpersonal relationship, driving, shopping, community activity, and yard work  PERSONAL FACTORS Fitness, Past/current experiences, Time since onset of  injury/illness/exacerbation, and 3+ comorbidities: hx of brain tumor, past CVA, HTN, obesity, learning disability, DMII are also affecting patient's functional outcome.   REHAB POTENTIAL: Fair    CLINICAL DECISION MAKING: Stable/uncomplicated  EVALUATION COMPLEXITY: Low  PLAN: PT FREQUENCY: 1-2x/week  PT DURATION: 12 weeks  PLANNED INTERVENTIONS: Therapeutic exercises, Therapeutic activity, Neuromuscular re-education, Balance training, Gait training, Patient/Family education, Joint mobilization, Stair training, Vestibular training, Orthotic/Fit training, DME instructions, Dry Needling, Electrical stimulation, Wheelchair mobility training, Cryotherapy, Moist heat, Compression bandaging, Manual therapy, and Re-evaluation  PLAN FOR NEXT SESSION:     Continue with functional strengthening exercises of the LE's and gait training using RW. Incorporate proper technique with all transfers to decrease his risk of falling.     Murlene Army, PT Physical Therapist - [place]  [facility]  2:33 PM 08/14/23

## 2023-08-19 ENCOUNTER — Ambulatory Visit: Payer: 59

## 2023-08-19 DIAGNOSIS — R262 Difficulty in walking, not elsewhere classified: Secondary | ICD-10-CM | POA: Diagnosis not present

## 2023-08-19 DIAGNOSIS — R2681 Unsteadiness on feet: Secondary | ICD-10-CM

## 2023-08-19 DIAGNOSIS — M6281 Muscle weakness (generalized): Secondary | ICD-10-CM

## 2023-08-19 DIAGNOSIS — R2689 Other abnormalities of gait and mobility: Secondary | ICD-10-CM

## 2023-08-19 DIAGNOSIS — R269 Unspecified abnormalities of gait and mobility: Secondary | ICD-10-CM

## 2023-08-19 DIAGNOSIS — R278 Other lack of coordination: Secondary | ICD-10-CM

## 2023-08-19 NOTE — Therapy (Signed)
 OUTPATIENT PHYSICAL THERAPY NEURO TREATMENT  Patient Name: MENZO BRAAM MRN: [id number] DOB:1965-11-20, 58 y.o., male Today's Date: 08/20/2023  PCP: Rex Castor, MD  REFERRING PROVIDER: Rex Castor, MD    PT End of Session - 08/19/23 1403     Visit Number 79    Number of Visits 87    Date for PT Re-Evaluation 09/09/23    Progress Note Due on Visit 80    PT Start Time 1400    PT Stop Time 1444    PT Time Calculation (min) 44 min    Equipment Utilized During Treatment Gait belt    Activity Tolerance Patient tolerated treatment well    Behavior During Therapy WFL for tasks assessed/performed                Past Medical History:  Diagnosis Date   Asthma    GERD (gastroesophageal reflux disease)    Hyperlipidemia    Hypertension    Obesity    Stroke [facility])    Past Surgical History:  Procedure Laterality Date   BRAIN SURGERY     Patient Active Problem List   Diagnosis Date Noted   Impaired glucose tolerance 03/23/2020   Learning disability 03/23/2020   Urinary and fecal incontinence 06/14/2019   Chronic pain syndrome 03/23/2019   Constipation 03/23/2019   Dysarthria 03/23/2019   Dysphagia 03/23/2019   Learning difficulty 03/23/2019   Morbid obesity (HCC) 03/23/2019   Muscle weakness 03/23/2019   Vitamin D deficiency 03/23/2019   Bilateral carotid artery stenosis 12/15/2018   Moderate aortic valve stenosis 12/15/2018   BMI 45.0-49.9, adult (HCC) 11/10/2018   Cerebrovascular accident (HCC) 05/02/2016   Hemiparesis affecting right side as late effect of cerebrovascular accident (HCC) 05/01/2016   Brain tumor (HCC) 11/24/2014   Gastro-esophageal reflux disease without esophagitis 11/24/2014   Accident due to mechanical fall without injury 08/03/2014   Essential hypertension 06/13/2014   Mixed hyperlipidemia 06/13/2014   Unspecified sequelae of cerebral infarction 06/13/2014   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/2014   Type 2 diabetes mellitus without  complication (HCC) 01/06/2014   Neck mass 09/08/2013   Swimmer's ear 09/08/2013   Erectile dysfunction 01/03/2013   Prediabetes 01/03/2013   ONSET DATE: 05/09/2016  REFERRING DIAG: W09.811 (ICD-10-CM) - Hemiplegia and hemiparesis following cerebral infarction affecting right dominant side   THERAPY DIAG:  Difficulty in walking, not elsewhere classified  Unsteadiness on feet  Other abnormalities of gait and mobility  Muscle weakness (generalized)  Abnormality of gait and mobility  Other lack of coordination   Rationale for Evaluation and Treatment Rehabilitation  SUBJECTIVE:  SUBJECTIVE STATEMENT   Caregiver stated patient fell yesterday at home- standing in bathroom getting ready to take a bath. EMS - arrived and patient denied any pain and did not go to ED  Pt accompanied by: self   PERTINENT HISTORY:   The pt is a pleasant 58 yo male who returns to PT for evaluation of deficits s/o old CVA. Pt with hx of CVA Feb 2018. He was previously fully independent, but CVA caused significant deficits, mainly affecting his right side. Pt reports continued difficulty with walking and transfers. Pt still using WC for majority of mobility, otherwise he uses his RW. Pt lives with his mom who is his main caregiver. Patient had outpatient PT previously in clinic in 2023. He reports since discharge he has been completing his HEP and walking about every day. Pt would like to improve his walking mainly, but also would like to work on balance. He also reports difficulty with transfers. Pt's mom and his caregiver helps him with ADLs. However, he says he is now waiting for a new caregiver. Prior to this pt had caregiver 3x/week. PMH significant for hx of brain tumor, past CVA, DMII and HTN. See chart for additional  information.  PAIN:  Are you having pain? Pt reports no pain currently  PRECAUTIONS: fall  WEIGHT BEARING RESTRICTIONS none  PLOF: required assistance with ADLs (old CVA), prior to CVA in 2018 was indep.  History of Falls: Pt reports no recent falls  Home information: Pt lives with mother who is his primary caregiver. Previously had home aide, 3x/week but reports currently waiting for new one.  PATIENT GOALS: Pt would like to improve his ability to ambulate.  OBJECTIVE:  taken at [facility] unless otherwise noted:   DIAGNOSTIC FINDINGS: No recent imaging per chart  Objective measures taken at eval unless otherwise specified:   Posture: forward head posture, rounded shoulders  MMT LE: grossly  4+/5, exception hip flexors and abductors 4/5 each bilat   Functional tests:  5xSTS:  34 sec, use of BUE (multiple initial attempts with posterior LOB in chair)   : 0.096 m/s with RW, close CGA, and WC follow  Gait for distance (goal 150 ft): deferred due to time  Gait mechanics: impaired, decreased speed (see ), crouched posture, poor postural stability and relies heavily on BUE support on RW. Pt now with bilat AFOS   FOTO: 44 (goal 50)    TODAY'S TREATMENT  08/20/23    Wearing B LE custom AFOs. PT applied gait belt at start of session, CGA provided throughout session, unless otherwise stated for improved safety    Therapeutic Activities: dynamic therapeutic activities designed to achieve improved functional performance - performed today with CGA, use of gait belt, and w/c close.   Sit to stand x 5 with BUE support- Slower today and min assist on last 3 with some posterior lean upon standing (no Pain reported)  Walking in clinic with bariatric walker x 160 feet focusing on erect posture and improving step length while trying to push walker continuously.    Self care/home modifications: Assisted patient in wheelchair issue- trying to tighten leg rest mounting pin-   Reviewed how to contact Medical supply store and discuss if new chair available or how to get repairs to current chair       PATIENT EDUCATION: Education details: Pt educated throughout session about proper posture and technique with exercises. Improved exercise technique, movement at target joints, use of target muscles after min to mod verbal,  visual, tactile cues.  Person educated: Patient Education method: Explanation, Demonstration, Tactile cues, and Verbal cues Education comprehension: verbalized understanding, returned demonstration, and verbal cues required  HOME EXERCISE PROGRAM:  Standing hip flexor stretch at counter, prolonged standing with erect posture for gluteal activation, HS curls with RTB   Added the below HEP on 05/15/23  Access Code: [email or web address] URL: [email or web address] Date: 05/15/2023 Prepared by: Carlen Chasten  Exercises - Seated Long Arc Quad with Ankle Weight  - 1 x daily - 7 x weekly - 2 sets - 10 reps   GOALS: Goals reviewed with patient? No  SHORT TERM GOALS: Target date: 11/12/2022  Pt will be independent with HEP in order to improve strength and balance in order to decrease fall risk and improve function at home. Baseline: instructed in HEP today, to provide printout next visit Goal status: MET    LONG TERM GOALS: Target date: 09/09/2023  Pt will increase FOTO to at least 50 in order to demonstrate improvement in function related to mobility and QoL. Baseline: 44 7/29:48 9/10:40 12/24/2022= 51 Goal status: MET  2.  Pt (< 60 yrs old) will complete 5xSTS test < 30 seconds indicating an increase in LE strength and improved balance. Baseline: 10/01/22 34 sec (multiple initial attempts with posterior LOB in chair) 12/20: 49 seconds 11/04/22:1:06, min A on multiple attempts to maintain balance and pt stays in flexed posture throughout test when coming to standing.   9/10: 27.5 sec, Pushes WC post consistently, close CGA  10/22: 47  seconds first attempt 12/5: 1 minute, 37 seconds, multiple attempts to get first stand, but no difficulty after that; 03/20/2023= 1 min 2 sec with 1UE on walker and 1 on armrest- some posterior lean- Close CGA; 06/17/2023= 38 sec with 1 UE on walker and 1 on armrest Goal status: ONGOING  3.  Pt will increase to > 0.5 m/s (30 sec) as to improve gait speed for better home ambulation and reduce fall risk. Baseline: 10/01/22 0.096 m/s with RW, close CGA, and WC follow previously 12/27: 0.11 m/s 11/04/22: .056 m/s  9/10: 076 m/s 10/22: 0.64 m/s  11/26: .076 m/s; 03/20/2023= 0.08 m/s; 04/22/2023= 0.089 m/s; 05/15/2023 = 0.079 m/s; 05/27/2023= 0.11 m/s; 07/17/2023= 0.15 m/s Goal status: PROGRESSING  4.  Pt will increase hip flexor and abductor  gross strength to 4+/5 bilat as to improve functional strength for independent gait, increased standing tolerance, and increased ADL ability. Baseline: 10/01/22 4+/5, exception hip flexors and abductors 4/5 each bilat  (previously 03/27/22: 4+/5 ) 9/10: Hip flexor 4/5, hip abductor 4+/5 10/22: hip flexor 4/5 hip abductor 4+/5 11/26: hip flexor 4+/5 hip abductor 4+/5 Goal status: MET  5.  Pt will ambulate 150 ft with BRW and WC follow with no rest breaks in less than 12 min to allow for improved mobility within home and increased independence. Baseline: 10/03/22 150' with BRW 04/03/22: 105 ft with BRW 7/29: 100 in 9:30 with pediatric RW 9/10:101 ft with BRW stooped posture worsened throughout test 12/24/2022- 150 feet using BRW, CGA with w/c follow- in 10 min 17 sec. Will keep goal to make sure he can do this consistently 10/22: 119 ft with BRW 8 min 23 seconds terminated by patient  11/26: 148 ft with BRW 14 min 34 seconds terminated by patient 03/20/2023= 152 feet using BRW, CGA and w/c follow in 14 min 3 sec- Need to keep goal active to ensure consistency but will revised and add a time element to goal. 04/22/2023= 162  feet in 11 min 54 sec using bariatric RW, CGA  and close CGA assist; 05/27/2023- 170 feet without rest break using bariatric RW. 06/17/2023= 160 feet with bariatric 4WW in 9 min- stopped due to fatigue.  Goal status: MET   6. Pt will decrease TUG to below 1 min 30 seconds/decrease in order to demonstrate decreased fall risk. Baseline: 06/17/2023= 2 min using bariatric 4WW-  Goal status: NEW  7. Pt will ambulate 300 ft with BRW, CGA and WC follow with no rest breaks in less than 20 min to allow for improved mobility within home/community and increased independence. Baseline: 06/17/2023= 160 feet with bariatric 4WW in 9 min- stopped due to fatigue. 07/17/2023- Patient able to walk 270 feet in > 20 min with BRW, CGA and close w/c follow today- VC for posture and for gait sequencing Goal Status: PROGRESSING    ASSESSMENT :  CLINICAL IMPRESSION:    Patient presents with good motivation- states w/c is having continued issues- leg rest pin is loose. Treatment was focused on walking and patient was slower overall today- may be due to recent fall- although denies soreness. He was still very motivated to try to walk but required more VC over time.  Patient will benefit from continued skilled physical therapy to further progress his strength and balance to decrease fall risk, improve QOL, and attain therapy goals.    OBJECTIVE IMPAIRMENTS Abnormal gait, decreased activity tolerance, decreased balance, decreased coordination, decreased endurance, decreased mobility, difficulty walking, decreased ROM, decreased strength, improper body mechanics, postural dysfunction, and obesity.   ACTIVITY LIMITATIONS carrying, lifting, bending, sitting, standing, squatting, stairs, transfers, bed mobility, continence, bathing, toileting, dressing, hygiene/grooming, and locomotion level  PARTICIPATION LIMITATIONS: meal prep, cleaning, laundry, interpersonal relationship, driving, shopping, community activity, and yard work  PERSONAL FACTORS Fitness, Past/current  experiences, Time since onset of injury/illness/exacerbation, and 3+ comorbidities: hx of brain tumor, past CVA, HTN, obesity, learning disability, DMII are also affecting patient's functional outcome.   REHAB POTENTIAL: Fair    CLINICAL DECISION MAKING: Stable/uncomplicated  EVALUATION COMPLEXITY: Low  PLAN: PT FREQUENCY: 1-2x/week  PT DURATION: 12 weeks  PLANNED INTERVENTIONS: Therapeutic exercises, Therapeutic activity, Neuromuscular re-education, Balance training, Gait training, Patient/Family education, Joint mobilization, Stair training, Vestibular training, Orthotic/Fit training, DME instructions, Dry Needling, Electrical stimulation, Wheelchair mobility training, Cryotherapy, Moist heat, Compression bandaging, Manual therapy, and Re-evaluation  PLAN FOR NEXT SESSION:     Continue with functional strengthening exercises of the LE's and gait training using RW. Incorporate proper technique with all transfers to decrease his risk of falling.     Murlene Army, PT Physical Therapist - [place]  [facility]  1:20 PM 08/20/23

## 2023-08-21 ENCOUNTER — Ambulatory Visit: Payer: 59

## 2023-08-21 DIAGNOSIS — R278 Other lack of coordination: Secondary | ICD-10-CM

## 2023-08-21 DIAGNOSIS — R2689 Other abnormalities of gait and mobility: Secondary | ICD-10-CM

## 2023-08-21 DIAGNOSIS — R296 Repeated falls: Secondary | ICD-10-CM

## 2023-08-21 DIAGNOSIS — M6281 Muscle weakness (generalized): Secondary | ICD-10-CM

## 2023-08-21 DIAGNOSIS — R269 Unspecified abnormalities of gait and mobility: Secondary | ICD-10-CM

## 2023-08-21 DIAGNOSIS — R262 Difficulty in walking, not elsewhere classified: Secondary | ICD-10-CM | POA: Diagnosis not present

## 2023-08-21 DIAGNOSIS — R2681 Unsteadiness on feet: Secondary | ICD-10-CM

## 2023-08-21 NOTE — Therapy (Unsigned)
 OUTPATIENT PHYSICAL THERAPY NEURO TREATMENT/Physical Therapy Progress Note   Dates of reporting period  07/17/2023   to   08/21/2023  Patient Name: Curtis Powell MRN: [id number] DOB:1965/07/23, 58 y.o., male Today's Date: 08/22/2023  PCP: Rex Castor, MD  REFERRING PROVIDER: Rex Castor, MD    PT End of Session - 08/21/23 1335     Visit Number 80    Number of Visits 87    Date for PT Re-Evaluation 09/09/23    Progress Note Due on Visit 90    PT Start Time 1313    PT Stop Time 1358    PT Time Calculation (min) 45 min    Equipment Utilized During Treatment Gait belt    Activity Tolerance Patient tolerated treatment well    Behavior During Therapy WFL for tasks assessed/performed                Past Medical History:  Diagnosis Date   Asthma    GERD (gastroesophageal reflux disease)    Hyperlipidemia    Hypertension    Obesity    Stroke [facility])    Past Surgical History:  Procedure Laterality Date   BRAIN SURGERY     Patient Active Problem List   Diagnosis Date Noted   Impaired glucose tolerance 03/23/2020   Learning disability 03/23/2020   Urinary and fecal incontinence 06/14/2019   Chronic pain syndrome 03/23/2019   Constipation 03/23/2019   Dysarthria 03/23/2019   Dysphagia 03/23/2019   Learning difficulty 03/23/2019   Morbid obesity (HCC) 03/23/2019   Muscle weakness 03/23/2019   Vitamin D deficiency 03/23/2019   Bilateral carotid artery stenosis 12/15/2018   Moderate aortic valve stenosis 12/15/2018   BMI 45.0-49.9, adult (HCC) 11/10/2018   Cerebrovascular accident (HCC) 05/02/2016   Hemiparesis affecting right side as late effect of cerebrovascular accident (HCC) 05/01/2016   Brain tumor (HCC) 11/24/2014   Gastro-esophageal reflux disease without esophagitis 11/24/2014   Accident due to mechanical fall without injury 08/03/2014   Essential hypertension 06/13/2014   Mixed hyperlipidemia 06/13/2014   Unspecified sequelae of cerebral  infarction 06/13/2014   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/2014   Type 2 diabetes mellitus without complication (HCC) 01/06/2014   Neck mass 09/08/2013   Swimmer's ear 09/08/2013   Erectile dysfunction 01/03/2013   Prediabetes 01/03/2013   ONSET DATE: 05/09/2016  REFERRING DIAG: N82.956 (ICD-10-CM) - Hemiplegia and hemiparesis following cerebral infarction affecting right dominant side   THERAPY DIAG:  Difficulty in walking, not elsewhere classified  Unsteadiness on feet  Other abnormalities of gait and mobility  Muscle weakness (generalized)  Abnormality of gait and mobility  Other lack of coordination  Repeated falls   Rationale for Evaluation and Treatment Rehabilitation  SUBJECTIVE:  SUBJECTIVE STATEMENT    Patient reports no pain from recent fall and states actually feeling really good today.   Pt accompanied by: self   PERTINENT HISTORY:   The pt is a pleasant 58 yo male who returns to PT for evaluation of deficits s/o old CVA. Pt with hx of CVA Feb 2018. He was previously fully independent, but CVA caused significant deficits, mainly affecting his right side. Pt reports continued difficulty with walking and transfers. Pt still using WC for majority of mobility, otherwise he uses his RW. Pt lives with his mom who is his main caregiver. Patient had outpatient PT previously in clinic in 2023. He reports since discharge he has been completing his HEP and walking about every day. Pt would like to improve his walking mainly, but also would like to work on balance. He also reports difficulty with transfers. Pt's mom and his caregiver helps him with ADLs. However, he says he is now waiting for a new caregiver. Prior to this pt had caregiver 3x/week. PMH significant for hx of brain tumor, past CVA, DMII and HTN.  See chart for additional information.  PAIN:  Are you having pain? Pt reports no pain currently  PRECAUTIONS: fall  WEIGHT BEARING RESTRICTIONS none  PLOF: required assistance with ADLs (old CVA), prior to CVA in 2018 was indep.  History of Falls: Pt reports no recent falls  Home information: Pt lives with mother who is his primary caregiver. Previously had home aide, 3x/week but reports currently waiting for new one.  PATIENT GOALS: Pt would like to improve his ability to ambulate.  OBJECTIVE:  taken at [facility] unless otherwise noted:   DIAGNOSTIC FINDINGS: No recent imaging per chart  Objective measures taken at eval unless otherwise specified:   Posture: forward head posture, rounded shoulders  MMT LE: grossly  4+/5, exception hip flexors and abductors 4/5 each bilat   Functional tests:  5xSTS:  34 sec, use of BUE (multiple initial attempts with posterior LOB in chair)   : 0.096 m/s with RW, close CGA, and WC follow  Gait for distance (goal 150 ft): deferred due to time  Gait mechanics: impaired, decreased speed (see ), crouched posture, poor postural stability and relies heavily on BUE support on RW. Pt now with bilat AFOS   FOTO: 44 (goal 50)    TODAY'S TREATMENT  08/22/23    Wearing B LE custom AFOs. PT applied gait belt at start of session, CGA provided throughout session, unless otherwise stated for improved safety   Physical therapy treatment session today consisted of completing assessment of goals and administration of testing as demonstrated and documented in flow sheet, treatment, and goals section of this note. Addition treatments may be found below.   10 Meter Walk Test: Patient instructed to walk 10 meters (32.8 ft) as quickly and as safely as possible at their normal speed x2 and at a fast speed x2. Time measured from 2 meter mark to 8 meter mark to accommodate ramp-up and ramp-down.  Normal speed 1: 0.15 m/s Normal speed 2: 0.13 m/s Average  Normal speed: 0.14 m/s Cut off scores: <0.4 m/s = household Ambulator, 0.4-0.8 m/s = limited community Ambulator, >0.8 m/s = community Ambulator, >1.2 m/s = crossing a street, <1.0 = increased fall risk MCID 0.05 m/s (small), 0.13 m/s (moderate), 0.06 m/s (significant)  (ANPTA Core Set of Outcome Measures for Adults with Neurologic Conditions, 2018)   Pt performed 5 time sit<>stand (5xSTS): 35 sec with 1UE on walker and  1 on armrest.  (>15 sec indicates increased fall risk)     Therapeutic Activities: dynamic therapeutic activities designed to achieve improved functional performance - performed today with CGA, use of gait belt, and w/c close.   Static standing without UE support x 2 min x 3 trials Anterior lean in manual w/c focusing on improved sit to stand transfers Sit to stand x 10 (after 5 x STS Test) as he was experiencing some posterior lean- focused on anterior weight shift and leaning forward upon standing to avoid falling backward.       PATIENT EDUCATION: Education details: Pt educated throughout session about proper posture and technique with exercises. Improved exercise technique, movement at target joints, use of target muscles after min to mod verbal, visual, tactile cues.  Person educated: Patient Education method: Explanation, Demonstration, Tactile cues, and Verbal cues Education comprehension: verbalized understanding, returned demonstration, and verbal cues required  HOME EXERCISE PROGRAM:  Standing hip flexor stretch at counter, prolonged standing with erect posture for gluteal activation, HS curls with RTB   Added the below HEP on 05/15/23  Access Code: [email or web address] URL: [email or web address] Date: 05/15/2023 Prepared by: Carlen Chasten  Exercises - Seated Long Arc Quad with Ankle Weight  - 1 x daily - 7 x weekly - 2 sets - 10 reps   GOALS: Goals reviewed with patient? No  SHORT TERM GOALS: Target date: 11/12/2022  Pt will be independent with  HEP in order to improve strength and balance in order to decrease fall risk and improve function at home. Baseline: instructed in HEP today, to provide printout next visit Goal status: MET    LONG TERM GOALS: Target date: 09/09/2023  Pt will increase FOTO to at least 50 in order to demonstrate improvement in function related to mobility and QoL. Baseline: 44 7/29:48 9/10:40 12/24/2022= 51 Goal status: MET  2.  Pt (< 60 yrs old) will complete 5xSTS test < 30 seconds indicating an increase in LE strength and improved balance. Baseline: 10/01/22 34 sec (multiple initial attempts with posterior LOB in chair) 12/20: 49 seconds 11/04/22:1:06, min A on multiple attempts to maintain balance and pt stays in flexed posture throughout test when coming to standing.   9/10: 27.5 sec, Pushes WC post consistently, close CGA  10/22: 47 seconds first attempt 12/5: 1 minute, 37 seconds, multiple attempts to get first stand, but no difficulty after that; 03/20/2023= 1 min 2 sec with 1UE on walker and 1 on armrest- some posterior lean- Close CGA; 06/17/2023= 38 sec with 1 UE on walker and 1 on armrest; 08/21/2023= 35 sec with 1UE on walker and 1 on armrest.  Goal status: ONGOING  3.  Pt will increase to > 0.5 m/s (30 sec) as to improve gait speed for better home ambulation and reduce fall risk. Baseline: 10/01/22 0.096 m/s with RW, close CGA, and WC follow previously 12/27: 0.11 m/s 11/04/22: .056 m/s  9/10: 076 m/s 10/22: 0.64 m/s  11/26: .076 m/s; 03/20/2023= 0.08 m/s; 04/22/2023= 0.089 m/s; 05/15/2023 = 0.079 m/s; 05/27/2023= 0.11 m/s; 07/17/2023= 0.15 m/s; 08/21/2023= 0.14 m/s avg with Bariatric walker Goal status: PROGRESSING  4.  Pt will increase hip flexor and abductor  gross strength to 4+/5 bilat as to improve functional strength for independent gait, increased standing tolerance, and increased ADL ability. Baseline: 10/01/22 4+/5, exception hip flexors and abductors 4/5 each bilat  (previously 03/27/22: 4+/5  ) 9/10: Hip flexor 4/5, hip abductor 4+/5 10/22: hip flexor 4/5 hip abductor 4+/5 11/26: hip  flexor 4+/5 hip abductor 4+/5 Goal status: MET  5.  Pt will ambulate 150 ft with BRW and WC follow with no rest breaks in less than 12 min to allow for improved mobility within home and increased independence. Baseline: 10/03/22 150' with BRW 04/03/22: 105 ft with BRW 7/29: 100 in 9:30 with pediatric RW 9/10:101 ft with BRW stooped posture worsened throughout test 12/24/2022- 150 feet using BRW, CGA with w/c follow- in 10 min 17 sec. Will keep goal to make sure he can do this consistently 10/22: 119 ft with BRW 8 min 23 seconds terminated by patient  11/26: 148 ft with BRW 14 min 34 seconds terminated by patient 03/20/2023= 152 feet using BRW, CGA and w/c follow in 14 min 3 sec- Need to keep goal active to ensure consistency but will revised and add a time element to goal. 04/22/2023= 162 feet in 11 min 54 sec using bariatric RW, CGA and close CGA assist; 05/27/2023- 170 feet without rest break using bariatric RW. 06/17/2023= 160 feet with bariatric 4WW in 9 min- stopped due to fatigue.  Goal status: MET   6. Pt will decrease TUG to below 1 min 30 seconds/decrease in order to demonstrate decreased fall risk. Baseline: 06/17/2023= 2 min using bariatric 4WW-  Goal status: NEW  7. Pt will ambulate 300 ft with BRW, CGA and WC follow with no rest breaks in less than 20 min to allow for improved mobility within home/community and increased independence. Baseline: 06/17/2023= 160 feet with bariatric 4WW in 9 min- stopped due to fatigue. 07/17/2023- Patient able to walk 270 feet in > 20 min with BRW, CGA and close w/c follow today- VC for posture and for gait sequencing; 08/21/2023- Not specifically testing but patient has been walking 160 feet or more each Visit Goal Status: PROGRESSING    ASSESSMENT :  CLINICAL IMPRESSION:   Patient presents with good motivation for today's session. He did present with more  posterior lean today with standing- yet improved with practice and VC for improved anterior trunk lean. He is maintaining his current improvement in gait speed and continues to progress slow and steady with functional LE strength as seen by improved time with 5x STS. Patient's condition has the potential to improve in response to therapy. Maximum improvement is yet to be obtained. The anticipated improvement is attainable and reasonable in a generally predictable time. Patient will benefit from continued skilled physical therapy to further progress his strength and balance to decrease fall risk, improve QOL, and attain therapy goals.    OBJECTIVE IMPAIRMENTS Abnormal gait, decreased activity tolerance, decreased balance, decreased coordination, decreased endurance, decreased mobility, difficulty walking, decreased ROM, decreased strength, improper body mechanics, postural dysfunction, and obesity.   ACTIVITY LIMITATIONS carrying, lifting, bending, sitting, standing, squatting, stairs, transfers, bed mobility, continence, bathing, toileting, dressing, hygiene/grooming, and locomotion level  PARTICIPATION LIMITATIONS: meal prep, cleaning, laundry, interpersonal relationship, driving, shopping, community activity, and yard work  PERSONAL FACTORS Fitness, Past/current experiences, Time since onset of injury/illness/exacerbation, and 3+ comorbidities: hx of brain tumor, past CVA, HTN, obesity, learning disability, DMII are also affecting patient's functional outcome.   REHAB POTENTIAL: Fair    CLINICAL DECISION MAKING: Stable/uncomplicated  EVALUATION COMPLEXITY: Low  PLAN: PT FREQUENCY: 1-2x/week  PT DURATION: 12 weeks  PLANNED INTERVENTIONS: Therapeutic exercises, Therapeutic activity, Neuromuscular re-education, Balance training, Gait training, Patient/Family education, Joint mobilization, Stair training, Vestibular training, Orthotic/Fit training, DME instructions, Dry Needling, Electrical  stimulation, Wheelchair mobility training, Cryotherapy, Moist heat, Compression bandaging, Manual therapy,  and Re-evaluation  PLAN FOR NEXT SESSION:     Continue with functional strengthening exercises of the LE's and gait training using RW. Incorporate proper technique with all transfers to decrease his risk of falling.     Murlene Army, PT Physical Therapist - [place]  [facility]  7:17 AM 08/22/23

## 2023-08-26 ENCOUNTER — Ambulatory Visit: Payer: 59

## 2023-08-26 DIAGNOSIS — R262 Difficulty in walking, not elsewhere classified: Secondary | ICD-10-CM | POA: Diagnosis not present

## 2023-08-26 DIAGNOSIS — R2689 Other abnormalities of gait and mobility: Secondary | ICD-10-CM

## 2023-08-26 DIAGNOSIS — R278 Other lack of coordination: Secondary | ICD-10-CM

## 2023-08-26 DIAGNOSIS — R269 Unspecified abnormalities of gait and mobility: Secondary | ICD-10-CM

## 2023-08-26 DIAGNOSIS — R2681 Unsteadiness on feet: Secondary | ICD-10-CM

## 2023-08-26 DIAGNOSIS — M6281 Muscle weakness (generalized): Secondary | ICD-10-CM

## 2023-08-26 DIAGNOSIS — R296 Repeated falls: Secondary | ICD-10-CM

## 2023-08-26 NOTE — Therapy (Signed)
 OUTPATIENT PHYSICAL THERAPY NEURO TREATMENT/Physical Therapy Progress Note   Dates of reporting period  07/17/2023   to   08/21/2023  Patient Name: Curtis Powell MRN: [id number] DOB:1966/02/15, 58 y.o., male Today's Date: 08/26/2023  PCP: Rex Castor, MD  REFERRING PROVIDER: Rex Castor, MD    PT End of Session - 08/26/23 1415     Visit Number 81    Number of Visits 87    Date for PT Re-Evaluation 09/09/23    Progress Note Due on Visit 90    PT Start Time 1315    PT Stop Time 1354    PT Time Calculation (min) 39 min    Equipment Utilized During Treatment Gait belt    Activity Tolerance Patient tolerated treatment well    Behavior During Therapy WFL for tasks assessed/performed                Past Medical History:  Diagnosis Date   Asthma    GERD (gastroesophageal reflux disease)    Hyperlipidemia    Hypertension    Obesity    Stroke [facility])    Past Surgical History:  Procedure Laterality Date   BRAIN SURGERY     Patient Active Problem List   Diagnosis Date Noted   Impaired glucose tolerance 03/23/2020   Learning disability 03/23/2020   Urinary and fecal incontinence 06/14/2019   Chronic pain syndrome 03/23/2019   Constipation 03/23/2019   Dysarthria 03/23/2019   Dysphagia 03/23/2019   Learning difficulty 03/23/2019   Morbid obesity (HCC) 03/23/2019   Muscle weakness 03/23/2019   Vitamin D deficiency 03/23/2019   Bilateral carotid artery stenosis 12/15/2018   Moderate aortic valve stenosis 12/15/2018   BMI 45.0-49.9, adult (HCC) 11/10/2018   Cerebrovascular accident (HCC) 05/02/2016   Hemiparesis affecting right side as late effect of cerebrovascular accident (HCC) 05/01/2016   Brain tumor (HCC) 11/24/2014   Gastro-esophageal reflux disease without esophagitis 11/24/2014   Accident due to mechanical fall without injury 08/03/2014   Essential hypertension 06/13/2014   Mixed hyperlipidemia 06/13/2014   Unspecified sequelae of cerebral  infarction 06/13/2014   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/2014   Type 2 diabetes mellitus without complication (HCC) 01/06/2014   Neck mass 09/08/2013   Swimmer's ear 09/08/2013   Erectile dysfunction 01/03/2013   Prediabetes 01/03/2013   ONSET DATE: 05/09/2016  REFERRING DIAG: G38.756 (ICD-10-CM) - Hemiplegia and hemiparesis following cerebral infarction affecting right dominant side   THERAPY DIAG:  Difficulty in walking, not elsewhere classified  Unsteadiness on feet  Muscle weakness (generalized)  Other abnormalities of gait and mobility  Abnormality of gait and mobility  Other lack of coordination  Repeated falls   Rationale for Evaluation and Treatment Rehabilitation  SUBJECTIVE:  SUBJECTIVE STATEMENT    Patient reports no pain from recent fall and states actually feeling really good today.   Pt accompanied by: self   PERTINENT HISTORY:   The pt is a pleasant 58 yo male who returns to PT for evaluation of deficits s/o old CVA. Pt with hx of CVA Feb 2018. He was previously fully independent, but CVA caused significant deficits, mainly affecting his right side. Pt reports continued difficulty with walking and transfers. Pt still using WC for majority of mobility, otherwise he uses his RW. Pt lives with his mom who is his main caregiver. Patient had outpatient PT previously in clinic in 2023. He reports since discharge he has been completing his HEP and walking about every day. Pt would like to improve his walking mainly, but also would like to work on balance. He also reports difficulty with transfers. Pt's mom and his caregiver helps him with ADLs. However, he says he is now waiting for a new caregiver. Prior to this pt had caregiver 3x/week. PMH significant for hx of brain tumor, past CVA, DMII and HTN.  See chart for additional information.  PAIN:  Are you having pain? Pt reports no pain currently  PRECAUTIONS: fall  WEIGHT BEARING RESTRICTIONS none  PLOF: required assistance with ADLs (old CVA), prior to CVA in 2018 was indep.  History of Falls: Pt reports no recent falls  Home information: Pt lives with mother who is his primary caregiver. Previously had home aide, 3x/week but reports currently waiting for new one.  PATIENT GOALS: Pt would like to improve his ability to ambulate.  OBJECTIVE:  taken at [facility] unless otherwise noted:   DIAGNOSTIC FINDINGS: No recent imaging per chart  Objective measures taken at eval unless otherwise specified:   Posture: forward head posture, rounded shoulders  MMT LE: grossly  4+/5, exception hip flexors and abductors 4/5 each bilat   Functional tests:  5xSTS:  34 sec, use of BUE (multiple initial attempts with posterior LOB in chair)   : 0.096 m/s with RW, close CGA, and WC follow  Gait for distance (goal 150 ft): deferred due to time  Gait mechanics: impaired, decreased speed (see ), crouched posture, poor postural stability and relies heavily on BUE support on RW. Pt now with bilat AFOS   FOTO: 44 (goal 50)    TODAY'S TREATMENT  08/26/23    Wearing B LE custom AFOs. PT applied gait belt at start of session, CGA provided throughout session, unless otherwise stated for improved safety   Self care: Adjusted new pin for w/c leg rest as it was loose and nut came off.   TA:  Sit to stand x 5 with emphasis on hand placement and anterior trunk lean.  Gait in clinic for improved functional mobility - using bariatric RW- 160 feet, CGA and use of gait belt with w/c follow.  Multiple VC for posture and for step length.  Door activity- had patient open door and try to push it open while manuevering his walker- Very unsteady and unable to push the heavy fire door and push walker.  Step over 1/2 foam roll using walker- CGA to min  A- one posterior lean and LOB while picking up walker and lifting it over foam    PATIENT EDUCATION: Education details: Pt educated throughout session about proper posture and technique with exercises. Improved exercise technique, movement at target joints, use of target muscles after min to mod verbal, visual, tactile cues.  Person educated: Patient Education method:  Explanation, Demonstration, Tactile cues, and Verbal cues Education comprehension: verbalized understanding, returned demonstration, and verbal cues required  HOME EXERCISE PROGRAM:  Standing hip flexor stretch at counter, prolonged standing with erect posture for gluteal activation, HS curls with RTB  Added the below HEP on 05/15/23  Access Code: [email or web address] URL: [email or web address] Date: 05/15/2023 Prepared by: Carlen Chasten  Exercises - Seated Long Arc Quad with Ankle Weight  - 1 x daily - 7 x weekly - 2 sets - 10 reps   GOALS: Goals reviewed with patient? No  SHORT TERM GOALS: Target date: 11/12/2022  Pt will be independent with HEP in order to improve strength and balance in order to decrease fall risk and improve function at home. Baseline: instructed in HEP today, to provide printout next visit Goal status: MET    LONG TERM GOALS: Target date: 09/09/2023  Pt will increase FOTO to at least 50 in order to demonstrate improvement in function related to mobility and QoL. Baseline: 44 7/29:48 9/10:40 12/24/2022= 51 Goal status: MET  2.  Pt (< 60 yrs old) will complete 5xSTS test < 30 seconds indicating an increase in LE strength and improved balance. Baseline: 10/01/22 34 sec (multiple initial attempts with posterior LOB in chair) 12/20: 49 seconds 11/04/22:1:06, min A on multiple attempts to maintain balance and pt stays in flexed posture throughout test when coming to standing.   9/10: 27.5 sec, Pushes WC post consistently, close CGA  10/22: 47 seconds first attempt 12/5: 1 minute, 37 seconds,  multiple attempts to get first stand, but no difficulty after that; 03/20/2023= 1 min 2 sec with 1UE on walker and 1 on armrest- some posterior lean- Close CGA; 06/17/2023= 38 sec with 1 UE on walker and 1 on armrest; 08/21/2023= 35 sec with 1UE on walker and 1 on armrest.  Goal status: ONGOING  3.  Pt will increase to > 0.5 m/s (30 sec) as to improve gait speed for better home ambulation and reduce fall risk. Baseline: 10/01/22 0.096 m/s with RW, close CGA, and WC follow previously 12/27: 0.11 m/s 11/04/22: .056 m/s  9/10: 076 m/s 10/22: 0.64 m/s  11/26: .076 m/s; 03/20/2023= 0.08 m/s; 04/22/2023= 0.089 m/s; 05/15/2023 = 0.079 m/s; 05/27/2023= 0.11 m/s; 07/17/2023= 0.15 m/s; 08/21/2023= 0.14 m/s avg with Bariatric walker Goal status: PROGRESSING  4.  Pt will increase hip flexor and abductor  gross strength to 4+/5 bilat as to improve functional strength for independent gait, increased standing tolerance, and increased ADL ability. Baseline: 10/01/22 4+/5, exception hip flexors and abductors 4/5 each bilat  (previously 03/27/22: 4+/5 ) 9/10: Hip flexor 4/5, hip abductor 4+/5 10/22: hip flexor 4/5 hip abductor 4+/5 11/26: hip flexor 4+/5 hip abductor 4+/5 Goal status: MET  5.  Pt will ambulate 150 ft with BRW and WC follow with no rest breaks in less than 12 min to allow for improved mobility within home and increased independence. Baseline: 10/03/22 150' with BRW 04/03/22: 105 ft with BRW 7/29: 100 in 9:30 with pediatric RW 9/10:101 ft with BRW stooped posture worsened throughout test 12/24/2022- 150 feet using BRW, CGA with w/c follow- in 10 min 17 sec. Will keep goal to make sure he can do this consistently 10/22: 119 ft with BRW 8 min 23 seconds terminated by patient  11/26: 148 ft with BRW 14 min 34 seconds terminated by patient 03/20/2023= 152 feet using BRW, CGA and w/c follow in 14 min 3 sec- Need to keep goal active to ensure consistency but will revised  and add a time element to goal.  04/22/2023= 162 feet in 11 min 54 sec using bariatric RW, CGA and close CGA assist; 05/27/2023- 170 feet without rest break using bariatric RW. 06/17/2023= 160 feet with bariatric 4WW in 9 min- stopped due to fatigue.  Goal status: MET   6. Pt will decrease TUG to below 1 min 30 seconds/decrease in order to demonstrate decreased fall risk. Baseline: 06/17/2023= 2 min using bariatric 4WW-  Goal status: NEW  7. Pt will ambulate 300 ft with BRW, CGA and WC follow with no rest breaks in less than 20 min to allow for improved mobility within home/community and increased independence. Baseline: 06/17/2023= 160 feet with bariatric 4WW in 9 min- stopped due to fatigue. 07/17/2023- Patient able to walk 270 feet in > 20 min with BRW, CGA and close w/c follow today- VC for posture and for gait sequencing; 08/21/2023- Not specifically testing but patient has been walking 160 feet or more each Visit Goal Status: PROGRESSING    ASSESSMENT :  CLINICAL IMPRESSION:   Patient reported some difficulty at home and desires to be able to walk In/out front door (consisting of front door/screen door/threshold) and he practiced these obstacles today- some difficulty with coordination of task of opening door and maintaining balance within walker. He also struggled with stepping up/over 1/2 foam requring min Assist today. Patient will benefit from continued skilled physical therapy to further progress his strength and balance to decrease fall risk, improve QOL, and attain therapy goals.    OBJECTIVE IMPAIRMENTS Abnormal gait, decreased activity tolerance, decreased balance, decreased coordination, decreased endurance, decreased mobility, difficulty walking, decreased ROM, decreased strength, improper body mechanics, postural dysfunction, and obesity.   ACTIVITY LIMITATIONS carrying, lifting, bending, sitting, standing, squatting, stairs, transfers, bed mobility, continence, bathing, toileting, dressing, hygiene/grooming, and  locomotion level  PARTICIPATION LIMITATIONS: meal prep, cleaning, laundry, interpersonal relationship, driving, shopping, community activity, and yard work  PERSONAL FACTORS Fitness, Past/current experiences, Time since onset of injury/illness/exacerbation, and 3+ comorbidities: hx of brain tumor, past CVA, HTN, obesity, learning disability, DMII are also affecting patient's functional outcome.   REHAB POTENTIAL: Fair    CLINICAL DECISION MAKING: Stable/uncomplicated  EVALUATION COMPLEXITY: Low  PLAN: PT FREQUENCY: 1-2x/week  PT DURATION: 12 weeks  PLANNED INTERVENTIONS: Therapeutic exercises, Therapeutic activity, Neuromuscular re-education, Balance training, Gait training, Patient/Family education, Joint mobilization, Stair training, Vestibular training, Orthotic/Fit training, DME instructions, Dry Needling, Electrical stimulation, Wheelchair mobility training, Cryotherapy, Moist heat, Compression bandaging, Manual therapy, and Re-evaluation  PLAN FOR NEXT SESSION:     Continue with functional strengthening exercises of the LE's and gait training using RW. Incorporate proper technique with all transfers to decrease his risk of falling.     Murlene Army, PT Physical Therapist - [place]  [facility]  2:27 PM 08/26/23

## 2023-08-28 ENCOUNTER — Ambulatory Visit: Payer: 59

## 2023-08-28 DIAGNOSIS — R269 Unspecified abnormalities of gait and mobility: Secondary | ICD-10-CM

## 2023-08-28 DIAGNOSIS — R296 Repeated falls: Secondary | ICD-10-CM

## 2023-08-28 DIAGNOSIS — R262 Difficulty in walking, not elsewhere classified: Secondary | ICD-10-CM

## 2023-08-28 DIAGNOSIS — R2689 Other abnormalities of gait and mobility: Secondary | ICD-10-CM

## 2023-08-28 DIAGNOSIS — R278 Other lack of coordination: Secondary | ICD-10-CM

## 2023-08-28 DIAGNOSIS — R2681 Unsteadiness on feet: Secondary | ICD-10-CM

## 2023-08-28 DIAGNOSIS — M6281 Muscle weakness (generalized): Secondary | ICD-10-CM

## 2023-08-28 NOTE — Therapy (Signed)
 OUTPATIENT PHYSICAL THERAPY NEURO TREATMENT   Patient Name: Curtis Powell MRN: [id number] DOB:November 29, 1965, 58 y.o., male Today's Date: 08/29/2023  PCP: Rex Castor, MD  REFERRING PROVIDER: Rex Castor, MD    PT End of Session - 08/28/23 1326     Visit Number 82    Number of Visits 87    Date for PT Re-Evaluation 09/09/23    Progress Note Due on Visit 90    PT Start Time 1315    PT Stop Time 1358    PT Time Calculation (min) 43 min    Equipment Utilized During Treatment Gait belt    Activity Tolerance Patient tolerated treatment well    Behavior During Therapy WFL for tasks assessed/performed                Past Medical History:  Diagnosis Date   Asthma    GERD (gastroesophageal reflux disease)    Hyperlipidemia    Hypertension    Obesity    Stroke [facility])    Past Surgical History:  Procedure Laterality Date   BRAIN SURGERY     Patient Active Problem List   Diagnosis Date Noted   Impaired glucose tolerance 03/23/2020   Learning disability 03/23/2020   Urinary and fecal incontinence 06/14/2019   Chronic pain syndrome 03/23/2019   Constipation 03/23/2019   Dysarthria 03/23/2019   Dysphagia 03/23/2019   Learning difficulty 03/23/2019   Morbid obesity (HCC) 03/23/2019   Muscle weakness 03/23/2019   Vitamin D deficiency 03/23/2019   Bilateral carotid artery stenosis 12/15/2018   Moderate aortic valve stenosis 12/15/2018   BMI 45.0-49.9, adult (HCC) 11/10/2018   Cerebrovascular accident (HCC) 05/02/2016   Hemiparesis affecting right side as late effect of cerebrovascular accident (HCC) 05/01/2016   Brain tumor (HCC) 11/24/2014   Gastro-esophageal reflux disease without esophagitis 11/24/2014   Accident due to mechanical fall without injury 08/03/2014   Essential hypertension 06/13/2014   Mixed hyperlipidemia 06/13/2014   Unspecified sequelae of cerebral infarction 06/13/2014   Thyromegaly 01/07/2014   Type 2 diabetes mellitus without  complication (HCC) 01/06/2014   Neck mass 09/08/2013   Swimmer's ear 09/08/2013   Erectile dysfunction 01/03/2013   Prediabetes 01/03/2013   ONSET DATE: 05/09/2016  REFERRING DIAG: N82.956 (ICD-10-CM) - Hemiplegia and hemiparesis following cerebral infarction affecting right dominant side   THERAPY DIAG:  Difficulty in walking, not elsewhere classified  Unsteadiness on feet  Muscle weakness (generalized)  Other abnormalities of gait and mobility  Abnormality of gait and mobility  Other lack of coordination  Repeated falls   Rationale for Evaluation and Treatment Rehabilitation  SUBJECTIVE:  SUBJECTIVE STATEMENT    Patient reports doing well- no issues- no pain or falls.  Pt accompanied by: self   PERTINENT HISTORY:   The pt is a pleasant 58 yo male who returns to PT for evaluation of deficits s/o old CVA. Pt with hx of CVA Feb 2018. He was previously fully independent, but CVA caused significant deficits, mainly affecting his right side. Pt reports continued difficulty with walking and transfers. Pt still using WC for majority of mobility, otherwise he uses his RW. Pt lives with his mom who is his main caregiver. Patient had outpatient PT previously in clinic in 2023. He reports since discharge he has been completing his HEP and walking about every day. Pt would like to improve his walking mainly, but also would like to work on balance. He also reports difficulty with transfers. Pt's mom and his caregiver helps him with ADLs. However, he says he is now waiting for a new caregiver. Prior to this pt had caregiver 3x/week. PMH significant for hx of brain tumor, past CVA, DMII and HTN. See chart for additional information.  PAIN:  Are you having pain? Pt reports no pain currently  PRECAUTIONS:  fall  WEIGHT BEARING RESTRICTIONS none  PLOF: required assistance with ADLs (old CVA), prior to CVA in 2018 was indep.  History of Falls: Pt reports no recent falls  Home information: Pt lives with mother who is his primary caregiver. Previously had home aide, 3x/week but reports currently waiting for new one.  PATIENT GOALS: Pt would like to improve his ability to ambulate.  OBJECTIVE:  taken at [facility] unless otherwise noted:   DIAGNOSTIC FINDINGS: No recent imaging per chart  Objective measures taken at eval unless otherwise specified:   Posture: forward head posture, rounded shoulders  MMT LE: grossly  4+/5, exception hip flexors and abductors 4/5 each bilat   Functional tests:  5xSTS:  34 sec, use of BUE (multiple initial attempts with posterior LOB in chair)   : 0.096 m/s with RW, close CGA, and WC follow  Gait for distance (goal 150 ft): deferred due to time  Gait mechanics: impaired, decreased speed (see ), crouched posture, poor postural stability and relies heavily on BUE support on RW. Pt now with bilat AFOS   FOTO: 44 (goal 50)    TODAY'S TREATMENT  08/29/23    Wearing B LE custom AFOs. PT applied gait belt at start of session, CGA provided throughout session, unless otherwise stated for improved safety      TA:  Sit to stand x 10 with emphasis on hand placement and anterior trunk lean 1UE on armrest and 1 on railing- VC to anterior lean.  Static stand with BUE x 3 min focusing on erect posture.  Dynamic standing at dry erase board - picking magnet letter off from board and placing into basket x 10 min x 3 trials    PATIENT EDUCATION: Education details: Pt educated throughout session about proper posture and technique with exercises. Improved exercise technique, movement at target joints, use of target muscles after min to mod verbal, visual, tactile cues.  Person educated: Patient Education method: Explanation, Demonstration, Tactile cues, and  Verbal cues Education comprehension: verbalized understanding, returned demonstration, and verbal cues required  HOME EXERCISE PROGRAM:  Standing hip flexor stretch at counter, prolonged standing with erect posture for gluteal activation, HS curls with RTB  Added the below HEP on 05/15/23  Access Code: [email or web address] URL: [email or web address] Date: 05/15/2023 Prepared by: Carlen Chasten  Exercises -  Seated Long Arc Quad with Ankle Weight  - 1 x daily - 7 x weekly - 2 sets - 10 reps   GOALS: Goals reviewed with patient? No  SHORT TERM GOALS: Target date: 11/12/2022  Pt will be independent with HEP in order to improve strength and balance in order to decrease fall risk and improve function at home. Baseline: instructed in HEP today, to provide printout next visit Goal status: MET    LONG TERM GOALS: Target date: 09/09/2023  Pt will increase FOTO to at least 50 in order to demonstrate improvement in function related to mobility and QoL. Baseline: 44 7/29:48 9/10:40 12/24/2022= 51 Goal status: MET  2.  Pt (< 60 yrs old) will complete 5xSTS test < 30 seconds indicating an increase in LE strength and improved balance. Baseline: 10/01/22 34 sec (multiple initial attempts with posterior LOB in chair) 12/20: 49 seconds 11/04/22:1:06, min A on multiple attempts to maintain balance and pt stays in flexed posture throughout test when coming to standing.   9/10: 27.5 sec, Pushes WC post consistently, close CGA  10/22: 47 seconds first attempt 12/5: 1 minute, 37 seconds, multiple attempts to get first stand, but no difficulty after that; 03/20/2023= 1 min 2 sec with 1UE on walker and 1 on armrest- some posterior lean- Close CGA; 06/17/2023= 38 sec with 1 UE on walker and 1 on armrest; 08/21/2023= 35 sec with 1UE on walker and 1 on armrest.  Goal status: ONGOING  3.  Pt will increase to > 0.5 m/s (30 sec) as to improve gait speed for better home ambulation and reduce fall  risk. Baseline: 10/01/22 0.096 m/s with RW, close CGA, and WC follow previously 12/27: 0.11 m/s 11/04/22: .056 m/s  9/10: 076 m/s 10/22: 0.64 m/s  11/26: .076 m/s; 03/20/2023= 0.08 m/s; 04/22/2023= 0.089 m/s; 05/15/2023 = 0.079 m/s; 05/27/2023= 0.11 m/s; 07/17/2023= 0.15 m/s; 08/21/2023= 0.14 m/s avg with Bariatric walker Goal status: PROGRESSING  4.  Pt will increase hip flexor and abductor  gross strength to 4+/5 bilat as to improve functional strength for independent gait, increased standing tolerance, and increased ADL ability. Baseline: 10/01/22 4+/5, exception hip flexors and abductors 4/5 each bilat  (previously 03/27/22: 4+/5 ) 9/10: Hip flexor 4/5, hip abductor 4+/5 10/22: hip flexor 4/5 hip abductor 4+/5 11/26: hip flexor 4+/5 hip abductor 4+/5 Goal status: MET  5.  Pt will ambulate 150 ft with BRW and WC follow with no rest breaks in less than 12 min to allow for improved mobility within home and increased independence. Baseline: 10/03/22 150' with BRW 04/03/22: 105 ft with BRW 7/29: 100 in 9:30 with pediatric RW 9/10:101 ft with BRW stooped posture worsened throughout test 12/24/2022- 150 feet using BRW, CGA with w/c follow- in 10 min 17 sec. Will keep goal to make sure he can do this consistently 10/22: 119 ft with BRW 8 min 23 seconds terminated by patient  11/26: 148 ft with BRW 14 min 34 seconds terminated by patient 03/20/2023= 152 feet using BRW, CGA and w/c follow in 14 min 3 sec- Need to keep goal active to ensure consistency but will revised and add a time element to goal. 04/22/2023= 162 feet in 11 min 54 sec using bariatric RW, CGA and close CGA assist; 05/27/2023- 170 feet without rest break using bariatric RW. 06/17/2023= 160 feet with bariatric 4WW in 9 min- stopped due to fatigue.  Goal status: MET   6. Pt will decrease TUG to below 1 min 30 seconds/decrease  in order to demonstrate decreased fall risk. Baseline: 06/17/2023= 2 min using bariatric 4WW-  Goal status: NEW  7. Pt  will ambulate 300 ft with BRW, CGA and WC follow with no rest breaks in less than 20 min to allow for improved mobility within home/community and increased independence. Baseline: 06/17/2023= 160 feet with bariatric 4WW in 9 min- stopped due to fatigue. 07/17/2023- Patient able to walk 270 feet in > 20 min with BRW, CGA and close w/c follow today- VC for posture and for gait sequencing; 08/21/2023- Not specifically testing but patient has been walking 160 feet or more each Visit Goal Status: PROGRESSING    ASSESSMENT :  CLINICAL IMPRESSION:   Patient presents with good motivation for today's session. He was able to exhibit improving overall posture with standing. He was able to stand fairly erect while reaching overhead for magnet letter with just 1 UE support. He was fatigue but only required minimal rest breaks and in standing position for > 30 min total time today. Patient will benefit from continued skilled physical therapy to further progress his strength and balance to decrease fall risk, improve QOL, and attain therapy goals.    OBJECTIVE IMPAIRMENTS Abnormal gait, decreased activity tolerance, decreased balance, decreased coordination, decreased endurance, decreased mobility, difficulty walking, decreased ROM, decreased strength, improper body mechanics, postural dysfunction, and obesity.   ACTIVITY LIMITATIONS carrying, lifting, bending, sitting, standing, squatting, stairs, transfers, bed mobility, continence, bathing, toileting, dressing, hygiene/grooming, and locomotion level  PARTICIPATION LIMITATIONS: meal prep, cleaning, laundry, interpersonal relationship, driving, shopping, community activity, and yard work  PERSONAL FACTORS Fitness, Past/current experiences, Time since onset of injury/illness/exacerbation, and 3+ comorbidities: hx of brain tumor, past CVA, HTN, obesity, learning disability, DMII are also affecting patient's functional outcome.   REHAB POTENTIAL: Fair    CLINICAL  DECISION MAKING: Stable/uncomplicated  EVALUATION COMPLEXITY: Low  PLAN: PT FREQUENCY: 1-2x/week  PT DURATION: 12 weeks  PLANNED INTERVENTIONS: Therapeutic exercises, Therapeutic activity, Neuromuscular re-education, Balance training, Gait training, Patient/Family education, Joint mobilization, Stair training, Vestibular training, Orthotic/Fit training, DME instructions, Dry Needling, Electrical stimulation, Wheelchair mobility training, Cryotherapy, Moist heat, Compression bandaging, Manual therapy, and Re-evaluation  PLAN FOR NEXT SESSION:     Continue with functional strengthening exercises of the LE's and gait training using RW. Incorporate proper technique with all transfers to decrease his risk of falling.     Murlene Army, PT Physical Therapist - [facility]  7:50 AM 08/29/23

## 2023-09-02 ENCOUNTER — Ambulatory Visit: Payer: 59

## 2023-09-02 DIAGNOSIS — M6281 Muscle weakness (generalized): Secondary | ICD-10-CM

## 2023-09-02 DIAGNOSIS — R262 Difficulty in walking, not elsewhere classified: Secondary | ICD-10-CM

## 2023-09-02 DIAGNOSIS — R2689 Other abnormalities of gait and mobility: Secondary | ICD-10-CM

## 2023-09-02 DIAGNOSIS — R278 Other lack of coordination: Secondary | ICD-10-CM

## 2023-09-02 DIAGNOSIS — R296 Repeated falls: Secondary | ICD-10-CM

## 2023-09-02 DIAGNOSIS — R269 Unspecified abnormalities of gait and mobility: Secondary | ICD-10-CM

## 2023-09-02 DIAGNOSIS — R2681 Unsteadiness on feet: Secondary | ICD-10-CM

## 2023-09-02 NOTE — Therapy (Signed)
 OUTPATIENT PHYSICAL THERAPY NEURO TREATMENT   Patient Name: Curtis Powell MRN: [id number] DOB:12-Dec-1965, 58 y.o., male Today's Date: 09/02/2023  PCP: Rex Castor, MD  REFERRING PROVIDER: Rex Castor, MD    PT End of Session - 09/02/23 1306     Visit Number 83    Number of Visits 87    Date for PT Re-Evaluation 09/09/23    Progress Note Due on Visit 90    PT Start Time 1314    PT Stop Time 1357    PT Time Calculation (min) 43 min    Equipment Utilized During Treatment Gait belt    Activity Tolerance Patient tolerated treatment well    Behavior During Therapy WFL for tasks assessed/performed                 Past Medical History:  Diagnosis Date   Asthma    GERD (gastroesophageal reflux disease)    Hyperlipidemia    Hypertension    Obesity    Stroke [facility])    Past Surgical History:  Procedure Laterality Date   BRAIN SURGERY     Patient Active Problem List   Diagnosis Date Noted   Impaired glucose tolerance 03/23/2020   Learning disability 03/23/2020   Urinary and fecal incontinence 06/14/2019   Chronic pain syndrome 03/23/2019   Constipation 03/23/2019   Dysarthria 03/23/2019   Dysphagia 03/23/2019   Learning difficulty 03/23/2019   Morbid obesity (HCC) 03/23/2019   Muscle weakness 03/23/2019   Vitamin D deficiency 03/23/2019   Bilateral carotid artery stenosis 12/15/2018   Moderate aortic valve stenosis 12/15/2018   BMI 45.0-49.9, adult (HCC) 11/10/2018   Cerebrovascular accident (HCC) 05/02/2016   Hemiparesis affecting right side as late effect of cerebrovascular accident (HCC) 05/01/2016   Brain tumor (HCC) 11/24/2014   Gastro-esophageal reflux disease without esophagitis 11/24/2014   Accident due to mechanical fall without injury 08/03/2014   Essential hypertension 06/13/2014   Mixed hyperlipidemia 06/13/2014   Unspecified sequelae of cerebral infarction 06/13/2014   Thyromegaly 01/07/2014   Type 2 diabetes mellitus without  complication (HCC) 01/06/2014   Neck mass 09/08/2013   Swimmer's ear 09/08/2013   Erectile dysfunction 01/03/2013   Prediabetes 01/03/2013   ONSET DATE: 05/09/2016  REFERRING DIAG: V78.469 (ICD-10-CM) - Hemiplegia and hemiparesis following cerebral infarction affecting right dominant side   THERAPY DIAG:  Difficulty in walking, not elsewhere classified  Unsteadiness on feet  Muscle weakness (generalized)  Other abnormalities of gait and mobility  Abnormality of gait and mobility  Repeated falls  Other lack of coordination   Rationale for Evaluation and Treatment Rehabilitation  SUBJECTIVE:  SUBJECTIVE STATEMENT    Patient reports having a good weekend- No falls and states had a good cookout.   Pt accompanied by: self   PERTINENT HISTORY:   The pt is a pleasant 58 yo male who returns to PT for evaluation of deficits s/o old CVA. Pt with hx of CVA Feb 2018. He was previously fully independent, but CVA caused significant deficits, mainly affecting his right side. Pt reports continued difficulty with walking and transfers. Pt still using WC for majority of mobility, otherwise he uses his RW. Pt lives with his mom who is his main caregiver. Patient had outpatient PT previously in clinic in 2023. He reports since discharge he has been completing his HEP and walking about every day. Pt would like to improve his walking mainly, but also would like to work on balance. He also reports difficulty with transfers. Pt's mom and his caregiver helps him with ADLs. However, he says he is now waiting for a new caregiver. Prior to this pt had caregiver 3x/week. PMH significant for hx of brain tumor, past CVA, DMII and HTN. See chart for additional information.  PAIN:  Are you having pain? Pt reports no pain  currently  PRECAUTIONS: fall  WEIGHT BEARING RESTRICTIONS none  PLOF: required assistance with ADLs (old CVA), prior to CVA in 2018 was indep.  History of Falls: Pt reports no recent falls  Home information: Pt lives with mother who is his primary caregiver. Previously had home aide, 3x/week but reports currently waiting for new one.  PATIENT GOALS: Pt would like to improve his ability to ambulate.  OBJECTIVE:  taken at [facility] unless otherwise noted:   DIAGNOSTIC FINDINGS: No recent imaging per chart  Objective measures taken at eval unless otherwise specified:   Posture: forward head posture, rounded shoulders  MMT LE: grossly  4+/5, exception hip flexors and abductors 4/5 each bilat   Functional tests:  5xSTS:  34 sec, use of BUE (multiple initial attempts with posterior LOB in chair)   : 0.096 m/s with RW, close CGA, and WC follow  Gait for distance (goal 150 ft): deferred due to time  Gait mechanics: impaired, decreased speed (see ), crouched posture, poor postural stability and relies heavily on BUE support on RW. Pt now with bilat AFOS   FOTO: 44 (goal 50)    TODAY'S TREATMENT  09/02/23    Wearing B LE custom AFOs. PT applied gait belt at start of session, CGA provided throughout session, unless otherwise stated for improved safety      TA:  Static stand with BUE support x 5 min focusing on erect posture using mirror for feedback. Stepping up/over 1 in PVC in // bars x 3 in // bars then retro step (without PVC) back to start x 4 trials.  Standing hip flexor stretch in // bars - hold 30 sec x 2 each LE - focusing on erect standing and good stretch to each Hip flex.  Sit to stand x 10 with emphasis on hand placement and anterior trunk lean 1UE on armrest and 1 on railing- VC to anterior lean.  FWD/Retro walking in // bars x 4- Focusing on posture and step length for most efficient steps.     PATIENT EDUCATION: Education details: Pt educated  throughout session about proper posture and technique with exercises. Improved exercise technique, movement at target joints, use of target muscles after min to mod verbal, visual, tactile cues.  Person educated: Patient Education method: Explanation, Demonstration, Tactile cues, and  Verbal cues Education comprehension: verbalized understanding, returned demonstration, and verbal cues required  HOME EXERCISE PROGRAM:  Standing hip flexor stretch at counter, prolonged standing with erect posture for gluteal activation, HS curls with RTB  Added the below HEP on 05/15/23  Access Code: [email or web address] URL: [email or web address] Date: 05/15/2023 Prepared by: Carlen Chasten  Exercises - Seated Long Arc Quad with Ankle Weight  - 1 x daily - 7 x weekly - 2 sets - 10 reps   GOALS: Goals reviewed with patient? No  SHORT TERM GOALS: Target date: 11/12/2022  Pt will be independent with HEP in order to improve strength and balance in order to decrease fall risk and improve function at home. Baseline: instructed in HEP today, to provide printout next visit Goal status: MET    LONG TERM GOALS: Target date: 09/09/2023  Pt will increase FOTO to at least 50 in order to demonstrate improvement in function related to mobility and QoL. Baseline: 44 7/29:48 9/10:40 12/24/2022= 51 Goal status: MET  2.  Pt (< 60 yrs old) will complete 5xSTS test < 30 seconds indicating an increase in LE strength and improved balance. Baseline: 10/01/22 34 sec (multiple initial attempts with posterior LOB in chair) 12/20: 49 seconds 11/04/22:1:06, min A on multiple attempts to maintain balance and pt stays in flexed posture throughout test when coming to standing.   9/10: 27.5 sec, Pushes WC post consistently, close CGA  10/22: 47 seconds first attempt 12/5: 1 minute, 37 seconds, multiple attempts to get first stand, but no difficulty after that; 03/20/2023= 1 min 2 sec with 1UE on walker and 1 on armrest- some  posterior lean- Close CGA; 06/17/2023= 38 sec with 1 UE on walker and 1 on armrest; 08/21/2023= 35 sec with 1UE on walker and 1 on armrest.  Goal status: ONGOING  3.  Pt will increase to > 0.5 m/s (30 sec) as to improve gait speed for better home ambulation and reduce fall risk. Baseline: 10/01/22 0.096 m/s with RW, close CGA, and WC follow previously 12/27: 0.11 m/s 11/04/22: .056 m/s  9/10: 076 m/s 10/22: 0.64 m/s  11/26: .076 m/s; 03/20/2023= 0.08 m/s; 04/22/2023= 0.089 m/s; 05/15/2023 = 0.079 m/s; 05/27/2023= 0.11 m/s; 07/17/2023= 0.15 m/s; 08/21/2023= 0.14 m/s avg with Bariatric walker Goal status: PROGRESSING  4.  Pt will increase hip flexor and abductor  gross strength to 4+/5 bilat as to improve functional strength for independent gait, increased standing tolerance, and increased ADL ability. Baseline: 10/01/22 4+/5, exception hip flexors and abductors 4/5 each bilat  (previously 03/27/22: 4+/5 ) 9/10: Hip flexor 4/5, hip abductor 4+/5 10/22: hip flexor 4/5 hip abductor 4+/5 11/26: hip flexor 4+/5 hip abductor 4+/5 Goal status: MET  5.  Pt will ambulate 150 ft with BRW and WC follow with no rest breaks in less than 12 min to allow for improved mobility within home and increased independence. Baseline: 10/03/22 150' with BRW 04/03/22: 105 ft with BRW 7/29: 100 in 9:30 with pediatric RW 9/10:101 ft with BRW stooped posture worsened throughout test 12/24/2022- 150 feet using BRW, CGA with w/c follow- in 10 min 17 sec. Will keep goal to make sure he can do this consistently 10/22: 119 ft with BRW 8 min 23 seconds terminated by patient  11/26: 148 ft with BRW 14 min 34 seconds terminated by patient 03/20/2023= 152 feet using BRW, CGA and w/c follow in 14 min 3 sec- Need to keep goal active to ensure consistency but will revised and add a time element  to goal. 04/22/2023= 162 feet in 11 min 54 sec using bariatric RW, CGA and close CGA assist; 05/27/2023- 170 feet without rest break using bariatric RW.  06/17/2023= 160 feet with bariatric 4WW in 9 min- stopped due to fatigue.  Goal status: MET   6. Pt will decrease TUG to below 1 min 30 seconds/decrease in order to demonstrate decreased fall risk. Baseline: 06/17/2023= 2 min using bariatric 4WW-  Goal status: NEW  7. Pt will ambulate 300 ft with BRW, CGA and WC follow with no rest breaks in less than 20 min to allow for improved mobility within home/community and increased independence. Baseline: 06/17/2023= 160 feet with bariatric 4WW in 9 min- stopped due to fatigue. 07/17/2023- Patient able to walk 270 feet in > 20 min with BRW, CGA and close w/c follow today- VC for posture and for gait sequencing; 08/21/2023- Not specifically testing but patient has been walking 160 feet or more each Visit Goal Status: PROGRESSING    ASSESSMENT :  CLINICAL IMPRESSION:   Patient continued to focus on working on his balance and standing ability. He was able to clear a 1 in pvc pipe today with 100 % accuracy- clearing his feet over obstacle to simulate threshold at front door. He was challenged and exhibited difficulty standing erect at times- using mirror for feedback and improved with practice today. Patient will benefit from continued skilled physical therapy to further progress his strength and balance to decrease fall risk, improve QOL, and attain therapy goals.    OBJECTIVE IMPAIRMENTS Abnormal gait, decreased activity tolerance, decreased balance, decreased coordination, decreased endurance, decreased mobility, difficulty walking, decreased ROM, decreased strength, improper body mechanics, postural dysfunction, and obesity.   ACTIVITY LIMITATIONS carrying, lifting, bending, sitting, standing, squatting, stairs, transfers, bed mobility, continence, bathing, toileting, dressing, hygiene/grooming, and locomotion level  PARTICIPATION LIMITATIONS: meal prep, cleaning, laundry, interpersonal relationship, driving, shopping, community activity, and yard  work  PERSONAL FACTORS Fitness, Past/current experiences, Time since onset of injury/illness/exacerbation, and 3+ comorbidities: hx of brain tumor, past CVA, HTN, obesity, learning disability, DMII are also affecting patient's functional outcome.   REHAB POTENTIAL: Fair    CLINICAL DECISION MAKING: Stable/uncomplicated  EVALUATION COMPLEXITY: Low  PLAN: PT FREQUENCY: 1-2x/week  PT DURATION: 12 weeks  PLANNED INTERVENTIONS: Therapeutic exercises, Therapeutic activity, Neuromuscular re-education, Balance training, Gait training, Patient/Family education, Joint mobilization, Stair training, Vestibular training, Orthotic/Fit training, DME instructions, Dry Needling, Electrical stimulation, Wheelchair mobility training, Cryotherapy, Moist heat, Compression bandaging, Manual therapy, and Re-evaluation  PLAN FOR NEXT SESSION:     Continue with functional strengthening exercises of the LE's and gait training using RW. Incorporate proper technique with all transfers to decrease his risk of falling.     Murlene Army, PT Physical Therapist - [place]  [facility]  4:53 PM 09/02/23

## 2023-09-04 ENCOUNTER — Ambulatory Visit: Payer: 59

## 2023-09-04 DIAGNOSIS — R262 Difficulty in walking, not elsewhere classified: Secondary | ICD-10-CM

## 2023-09-04 DIAGNOSIS — M6281 Muscle weakness (generalized): Secondary | ICD-10-CM

## 2023-09-04 DIAGNOSIS — R2681 Unsteadiness on feet: Secondary | ICD-10-CM

## 2023-09-04 DIAGNOSIS — R2689 Other abnormalities of gait and mobility: Secondary | ICD-10-CM

## 2023-09-04 DIAGNOSIS — R269 Unspecified abnormalities of gait and mobility: Secondary | ICD-10-CM

## 2023-09-04 DIAGNOSIS — R296 Repeated falls: Secondary | ICD-10-CM

## 2023-09-04 DIAGNOSIS — R278 Other lack of coordination: Secondary | ICD-10-CM

## 2023-09-04 NOTE — Therapy (Signed)
 OUTPATIENT PHYSICAL THERAPY NEURO TREATMENT   Patient Name: Curtis Powell MRN: [id number] DOB:1965-08-01, 58 y.o., male Today's Date: 09/05/2023  PCP: Rex Castor, MD  REFERRING PROVIDER: Rex Castor, MD    PT End of Session - 09/04/23 1326     Visit Number 84    Number of Visits 87    Date for PT Re-Evaluation 09/09/23    Progress Note Due on Visit 90    PT Start Time 1315    PT Stop Time 1356    PT Time Calculation (min) 41 min    Equipment Utilized During Treatment Gait belt    Activity Tolerance Patient tolerated treatment well    Behavior During Therapy WFL for tasks assessed/performed                  Past Medical History:  Diagnosis Date   Asthma    GERD (gastroesophageal reflux disease)    Hyperlipidemia    Hypertension    Obesity    Stroke [facility])    Past Surgical History:  Procedure Laterality Date   BRAIN SURGERY     Patient Active Problem List   Diagnosis Date Noted   Impaired glucose tolerance 03/23/2020   Learning disability 03/23/2020   Urinary and fecal incontinence 06/14/2019   Chronic pain syndrome 03/23/2019   Constipation 03/23/2019   Dysarthria 03/23/2019   Dysphagia 03/23/2019   Learning difficulty 03/23/2019   Morbid obesity (HCC) 03/23/2019   Muscle weakness 03/23/2019   Vitamin D deficiency 03/23/2019   Bilateral carotid artery stenosis 12/15/2018   Moderate aortic valve stenosis 12/15/2018   BMI 45.0-49.9, adult (HCC) 11/10/2018   Cerebrovascular accident (HCC) 05/02/2016   Hemiparesis affecting right side as late effect of cerebrovascular accident (HCC) 05/01/2016   Brain tumor (HCC) 11/24/2014   Gastro-esophageal reflux disease without esophagitis 11/24/2014   Accident due to mechanical fall without injury 08/03/2014   Essential hypertension 06/13/2014   Mixed hyperlipidemia 06/13/2014   Unspecified sequelae of cerebral infarction 06/13/2014   Thyromegaly 01/07/2014   Type 2 diabetes mellitus without  complication (HCC) 01/06/2014   Neck mass 09/08/2013   Swimmer's ear 09/08/2013   Erectile dysfunction 01/03/2013   Prediabetes 01/03/2013   ONSET DATE: 05/09/2016  REFERRING DIAG: N82.956 (ICD-10-CM) - Hemiplegia and hemiparesis following cerebral infarction affecting right dominant side   THERAPY DIAG:  Difficulty in walking, not elsewhere classified  Unsteadiness on feet  Muscle weakness (generalized)  Other abnormalities of gait and mobility  Abnormality of gait and mobility  Repeated falls  Other lack of coordination   Rationale for Evaluation and Treatment Rehabilitation  SUBJECTIVE:  SUBJECTIVE STATEMENT    Patient reports feeling good - well rested    Pt accompanied by: self   PERTINENT HISTORY:   The pt is a pleasant 58 yo male who returns to PT for evaluation of deficits s/o old CVA. Pt with hx of CVA Feb 2018. He was previously fully independent, but CVA caused significant deficits, mainly affecting his right side. Pt reports continued difficulty with walking and transfers. Pt still using WC for majority of mobility, otherwise he uses his RW. Pt lives with his mom who is his main caregiver. Patient had outpatient PT previously in clinic in 2023. He reports since discharge he has been completing his HEP and walking about every day. Pt would like to improve his walking mainly, but also would like to work on balance. He also reports difficulty with transfers. Pt's mom and his caregiver helps him with ADLs. However, he says he is now waiting for a new caregiver. Prior to this pt had caregiver 3x/week. PMH significant for hx of brain tumor, past CVA, DMII and HTN. See chart for additional information.  PAIN:  Are you having pain? Pt reports no pain currently  PRECAUTIONS:  fall  WEIGHT BEARING RESTRICTIONS none  PLOF: required assistance with ADLs (old CVA), prior to CVA in 2018 was indep.  History of Falls: Pt reports no recent falls  Home information: Pt lives with mother who is his primary caregiver. Previously had home aide, 3x/week but reports currently waiting for new one.  PATIENT GOALS: Pt would like to improve his ability to ambulate.  OBJECTIVE:  taken at [facility] unless otherwise noted:   DIAGNOSTIC FINDINGS: No recent imaging per chart  Objective measures taken at eval unless otherwise specified:   Posture: forward head posture, rounded shoulders  MMT LE: grossly  4+/5, exception hip flexors and abductors 4/5 each bilat   Functional tests:  5xSTS:  34 sec, use of BUE (multiple initial attempts with posterior LOB in chair)   : 0.096 m/s with RW, close CGA, and WC follow  Gait for distance (goal 150 ft): deferred due to time  Gait mechanics: impaired, decreased speed (see ), crouched posture, poor postural stability and relies heavily on BUE support on RW. Pt now with bilat AFOS   FOTO: 44 (goal 50)    TODAY'S TREATMENT  09/05/23    Wearing B LE custom AFOs. PT applied gait belt at start of session, CGA provided throughout session, unless otherwise stated for improved safety      TA:   Sit to stand x 10 with emphasis on hand placement and anterior trunk lean 1UE on armrest and 1 on walker- VC to anterior lean.   Gait in clinic: Patient ambulated approx 130 feet using bariatric walker - focusing on erect posture   NMR:  Seated row 2 sets of 12 reps with BTB Seated shoulder horz abd 2 sets of 12 reps with BTB Standing row (matrix cable system) 12.5 2 x 10 reps      PATIENT EDUCATION: Education details: Pt educated throughout session about proper posture and technique with exercises. Improved exercise technique, movement at target joints, use of target muscles after min to mod verbal, visual, tactile  cues.  Person educated: Patient Education method: Explanation, Demonstration, Tactile cues, and Verbal cues Education comprehension: verbalized understanding, returned demonstration, and verbal cues required  HOME EXERCISE PROGRAM:  Standing hip flexor stretch at counter, prolonged standing with erect posture for gluteal activation, HS curls with RTB  Added the below  HEP on 05/15/23  Access Code: [email or web address] URL: [email or web address] Date: 05/15/2023 Prepared by: Carlen Chasten  Exercises - Seated Long Arc Quad with Ankle Weight  - 1 x daily - 7 x weekly - 2 sets - 10 reps   GOALS: Goals reviewed with patient? No  SHORT TERM GOALS: Target date: 11/12/2022  Pt will be independent with HEP in order to improve strength and balance in order to decrease fall risk and improve function at home. Baseline: instructed in HEP today, to provide printout next visit Goal status: MET    LONG TERM GOALS: Target date: 09/09/2023  Pt will increase FOTO to at least 50 in order to demonstrate improvement in function related to mobility and QoL. Baseline: 44 7/29:48 9/10:40 12/24/2022= 51 Goal status: MET  2.  Pt (< 60 yrs old) will complete 5xSTS test < 30 seconds indicating an increase in LE strength and improved balance. Baseline: 10/01/22 34 sec (multiple initial attempts with posterior LOB in chair) 12/20: 49 seconds 11/04/22:1:06, min A on multiple attempts to maintain balance and pt stays in flexed posture throughout test when coming to standing.   9/10: 27.5 sec, Pushes WC post consistently, close CGA  10/22: 47 seconds first attempt 12/5: 1 minute, 37 seconds, multiple attempts to get first stand, but no difficulty after that; 03/20/2023= 1 min 2 sec with 1UE on walker and 1 on armrest- some posterior lean- Close CGA; 06/17/2023= 38 sec with 1 UE on walker and 1 on armrest; 08/21/2023= 35 sec with 1UE on walker and 1 on armrest.  Goal status: ONGOING  3.  Pt will increase to >  0.5 m/s (30 sec) as to improve gait speed for better home ambulation and reduce fall risk. Baseline: 10/01/22 0.096 m/s with RW, close CGA, and WC follow previously 12/27: 0.11 m/s 11/04/22: .056 m/s  9/10: 076 m/s 10/22: 0.64 m/s  11/26: .076 m/s; 03/20/2023= 0.08 m/s; 04/22/2023= 0.089 m/s; 05/15/2023 = 0.079 m/s; 05/27/2023= 0.11 m/s; 07/17/2023= 0.15 m/s; 08/21/2023= 0.14 m/s avg with Bariatric walker Goal status: PROGRESSING  4.  Pt will increase hip flexor and abductor  gross strength to 4+/5 bilat as to improve functional strength for independent gait, increased standing tolerance, and increased ADL ability. Baseline: 10/01/22 4+/5, exception hip flexors and abductors 4/5 each bilat  (previously 03/27/22: 4+/5 ) 9/10: Hip flexor 4/5, hip abductor 4+/5 10/22: hip flexor 4/5 hip abductor 4+/5 11/26: hip flexor 4+/5 hip abductor 4+/5 Goal status: MET  5.  Pt will ambulate 150 ft with BRW and WC follow with no rest breaks in less than 12 min to allow for improved mobility within home and increased independence. Baseline: 10/03/22 150' with BRW 04/03/22: 105 ft with BRW 7/29: 100 in 9:30 with pediatric RW 9/10:101 ft with BRW stooped posture worsened throughout test 12/24/2022- 150 feet using BRW, CGA with w/c follow- in 10 min 17 sec. Will keep goal to make sure he can do this consistently 10/22: 119 ft with BRW 8 min 23 seconds terminated by patient  11/26: 148 ft with BRW 14 min 34 seconds terminated by patient 03/20/2023= 152 feet using BRW, CGA and w/c follow in 14 min 3 sec- Need to keep goal active to ensure consistency but will revised and add a time element to goal. 04/22/2023= 162 feet in 11 min 54 sec using bariatric RW, CGA and close CGA assist; 05/27/2023- 170 feet without rest break using bariatric RW. 06/17/2023= 160 feet with bariatric 4WW in 9 min- stopped due to  fatigue.  Goal status: MET   6. Pt will decrease TUG to below 1 min 30 seconds/decrease in order to demonstrate decreased fall  risk. Baseline: 06/17/2023= 2 min using bariatric 4WW-  Goal status: NEW  7. Pt will ambulate 300 ft with BRW, CGA and WC follow with no rest breaks in less than 20 min to allow for improved mobility within home/community and increased independence. Baseline: 06/17/2023= 160 feet with bariatric 4WW in 9 min- stopped due to fatigue. 07/17/2023- Patient able to walk 270 feet in > 20 min with BRW, CGA and close w/c follow today- VC for posture and for gait sequencing; 08/21/2023- Not specifically testing but patient has been walking 160 feet or more each Visit Goal Status: PROGRESSING    ASSESSMENT :  CLINICAL IMPRESSION:   Treatment focused on postural strengthening and later applying his postural awareness with walking. He did perform several standing activities working on his posture and seemed more fatigued with less overall gait distance yet was able to demonstrate improved posture during walk overall. Patient will benefit from continued skilled physical therapy to further progress his strength and balance to decrease fall risk, improve QOL, and attain therapy goals.    OBJECTIVE IMPAIRMENTS Abnormal gait, decreased activity tolerance, decreased balance, decreased coordination, decreased endurance, decreased mobility, difficulty walking, decreased ROM, decreased strength, improper body mechanics, postural dysfunction, and obesity.   ACTIVITY LIMITATIONS carrying, lifting, bending, sitting, standing, squatting, stairs, transfers, bed mobility, continence, bathing, toileting, dressing, hygiene/grooming, and locomotion level  PARTICIPATION LIMITATIONS: meal prep, cleaning, laundry, interpersonal relationship, driving, shopping, community activity, and yard work  PERSONAL FACTORS Fitness, Past/current experiences, Time since onset of injury/illness/exacerbation, and 3+ comorbidities: hx of brain tumor, past CVA, HTN, obesity, learning disability, DMII are also affecting patient's functional outcome.    REHAB POTENTIAL: Fair    CLINICAL DECISION MAKING: Stable/uncomplicated  EVALUATION COMPLEXITY: Low  PLAN: PT FREQUENCY: 1-2x/week  PT DURATION: 12 weeks  PLANNED INTERVENTIONS: Therapeutic exercises, Therapeutic activity, Neuromuscular re-education, Balance training, Gait training, Patient/Family education, Joint mobilization, Stair training, Vestibular training, Orthotic/Fit training, DME instructions, Dry Needling, Electrical stimulation, Wheelchair mobility training, Cryotherapy, Moist heat, Compression bandaging, Manual therapy, and Re-evaluation  PLAN FOR NEXT SESSION:    -Continue with functional strengthening exercises of the LE's  -Gait activities using RW. Incorporate proper technique with all transfers to decrease his risk of falling.  -Perform Recert next visit    Murlene Army, PT Physical Therapist - [facility]  [facility]  7:27 AM 09/05/23

## 2023-09-09 ENCOUNTER — Ambulatory Visit: Payer: 59 | Attending: Internal Medicine

## 2023-09-09 DIAGNOSIS — R262 Difficulty in walking, not elsewhere classified: Secondary | ICD-10-CM | POA: Diagnosis present

## 2023-09-09 DIAGNOSIS — R2689 Other abnormalities of gait and mobility: Secondary | ICD-10-CM

## 2023-09-09 DIAGNOSIS — R2681 Unsteadiness on feet: Secondary | ICD-10-CM | POA: Diagnosis present

## 2023-09-09 DIAGNOSIS — R269 Unspecified abnormalities of gait and mobility: Secondary | ICD-10-CM | POA: Diagnosis present

## 2023-09-09 DIAGNOSIS — R296 Repeated falls: Secondary | ICD-10-CM | POA: Diagnosis present

## 2023-09-09 DIAGNOSIS — R278 Other lack of coordination: Secondary | ICD-10-CM | POA: Diagnosis present

## 2023-09-09 DIAGNOSIS — M6281 Muscle weakness (generalized): Secondary | ICD-10-CM | POA: Diagnosis present

## 2023-09-09 NOTE — Therapy (Signed)
 OUTPATIENT PHYSICAL THERAPY NEURO TREATMENT/RECERT   Patient Name: Curtis Powell MRN: [id number] DOB:November 05, 1965, 58 y.o., male Today's Date: 09/10/2023  PCP: Rex Castor, MD  REFERRING PROVIDER: Rex Castor, MD    PT End of Session - 09/09/23 1334     Visit Number 85    Number of Visits 109    Date for PT Re-Evaluation 12/02/23    Progress Note Due on Visit 90    PT Start Time 1314    PT Stop Time 1358    PT Time Calculation (min) 44 min    Equipment Utilized During Treatment Gait belt    Activity Tolerance Patient tolerated treatment well    Behavior During Therapy WFL for tasks assessed/performed                  Past Medical History:  Diagnosis Date   Asthma    GERD (gastroesophageal reflux disease)    Hyperlipidemia    Hypertension    Obesity    Stroke [facility])    Past Surgical History:  Procedure Laterality Date   BRAIN SURGERY     Patient Active Problem List   Diagnosis Date Noted   Impaired glucose tolerance 03/23/2020   Learning disability 03/23/2020   Urinary and fecal incontinence 06/14/2019   Chronic pain syndrome 03/23/2019   Constipation 03/23/2019   Dysarthria 03/23/2019   Dysphagia 03/23/2019   Learning difficulty 03/23/2019   Morbid obesity (HCC) 03/23/2019   Muscle weakness 03/23/2019   Vitamin D deficiency 03/23/2019   Bilateral carotid artery stenosis 12/15/2018   Moderate aortic valve stenosis 12/15/2018   BMI 45.0-49.9, adult (HCC) 11/10/2018   Cerebrovascular accident (HCC) 05/02/2016   Hemiparesis affecting right side as late effect of cerebrovascular accident (HCC) 05/01/2016   Brain tumor (HCC) 11/24/2014   Gastro-esophageal reflux disease without esophagitis 11/24/2014   Accident due to mechanical fall without injury 08/03/2014   Essential hypertension 06/13/2014   Mixed hyperlipidemia 06/13/2014   Unspecified sequelae of cerebral infarction 06/13/2014   Thyromegaly 01/07/2014   Type 2 diabetes mellitus  without complication (HCC) 01/06/2014   Neck mass 09/08/2013   Swimmer's ear 09/08/2013   Erectile dysfunction 01/03/2013   Prediabetes 01/03/2013   ONSET DATE: 05/09/2016  REFERRING DIAG: O96.295 (ICD-10-CM) - Hemiplegia and hemiparesis following cerebral infarction affecting right dominant side   THERAPY DIAG:  Difficulty in walking, not elsewhere classified - Plan: PT plan of care cert/re-cert  Unsteadiness on feet - Plan: PT plan of care cert/re-cert  Muscle weakness (generalized) - Plan: PT plan of care cert/re-cert  Other abnormalities of gait and mobility - Plan: PT plan of care cert/re-cert  Abnormality of gait and mobility - Plan: PT plan of care cert/re-cert  Repeated falls - Plan: PT plan of care cert/re-cert  Other lack of coordination - Plan: PT plan of care cert/re-cert   Rationale for Evaluation and Treatment Rehabilitation  SUBJECTIVE:  SUBJECTIVE STATEMENT    Patient reports feeling good - well rested    Pt accompanied by: self   PERTINENT HISTORY:   The pt is a pleasant 58 yo male who returns to PT for evaluation of deficits s/o old CVA. Pt with hx of CVA Feb 2018. He was previously fully independent, but CVA caused significant deficits, mainly affecting his right side. Pt reports continued difficulty with walking and transfers. Pt still using WC for majority of mobility, otherwise he uses his RW. Pt lives with his mom who is his main caregiver. Patient had outpatient PT previously in clinic in 2023. He reports since discharge he has been completing his HEP and walking about every day. Pt would like to improve his walking mainly, but also would like to work on balance. He also reports difficulty with transfers. Pt's mom and his caregiver helps him with ADLs. However, he says he  is now waiting for a new caregiver. Prior to this pt had caregiver 3x/week. PMH significant for hx of brain tumor, past CVA, DMII and HTN. See chart for additional information.  PAIN:  Are you having pain? Pt reports no pain currently  PRECAUTIONS: fall  WEIGHT BEARING RESTRICTIONS none  PLOF: required assistance with ADLs (old CVA), prior to CVA in 2018 was indep.  History of Falls: Pt reports no recent falls  Home information: Pt lives with mother who is his primary caregiver. Previously had home aide, 3x/week but reports currently waiting for new one.  PATIENT GOALS: Pt would like to improve his ability to ambulate.  OBJECTIVE:  taken at [facility] unless otherwise noted:   DIAGNOSTIC FINDINGS: No recent imaging per chart  Objective measures taken at eval unless otherwise specified:   Posture: forward head posture, rounded shoulders  MMT LE: grossly  4+/5, exception hip flexors and abductors 4/5 each bilat   Functional tests:  5xSTS:  34 sec, use of BUE (multiple initial attempts with posterior LOB in chair)   : 0.096 m/s with RW, close CGA, and WC follow  Gait for distance (goal 150 ft): deferred due to time  Gait mechanics: impaired, decreased speed (see ), crouched posture, poor postural stability and relies heavily on BUE support on RW. Pt now with bilat AFOS   FOTO: 44 (goal 50)    TODAY'S TREATMENT  09/10/23    Wearing B LE custom AFOs. PT applied gait belt at start of session, CGA provided throughout session, unless otherwise stated for improved safety      Physical therapy treatment session today consisted of completing assessment of goals and administration of testing as demonstrated and documented in flow sheet, treatment, and goals section of this note. Addition treatments may be found below.    10 Meter Walk Test: Patient instructed to walk 10 meters (32.8 ft) as quickly and as safely as possible at their normal speed x2 and at a fast speed x2.  Time measured from 2 meter mark to 8 meter mark to accommodate ramp-up and ramp-down.  Normal speed 1: 0.14 m/s Normal speed 2: 0.14 m/s Average Normal speed: 0.14 m/s Cut off scores: <0.4 m/s = household Ambulator, 0.4-0.8 m/s = limited community Ambulator, >0.8 m/s = community Ambulator, >1.2 m/s = crossing a street, <1.0 = increased fall risk MCID 0.05 m/s (small), 0.13 m/s (moderate), 0.06 m/s (significant)  (ANPTA Core Set of Outcome Measures for Adults with Neurologic Conditions, 2018)   PT instructed pt in TUG:  09/09/2023= 1 min 50 sec avg with bariatric front  wheeled walker (average of 3 trials; >13.5 sec indicates increased fall risk)    PATIENT EDUCATION: Education details: Pt educated throughout session about proper posture and technique with exercises. Improved exercise technique, movement at target joints, use of target muscles after min to mod verbal, visual, tactile cues.  Person educated: Patient Education method: Explanation, Demonstration, Tactile cues, and Verbal cues Education comprehension: verbalized understanding, returned demonstration, and verbal cues required  HOME EXERCISE PROGRAM:  Standing hip flexor stretch at counter, prolonged standing with erect posture for gluteal activation, HS curls with RTB  Added the below HEP on 05/15/23  Access Code: [email or web address] URL: [email or web address] Date: 05/15/2023 Prepared by: Carlen Chasten  Exercises - Seated Long Arc Quad with Ankle Weight  - 1 x daily - 7 x weekly - 2 sets - 10 reps   GOALS: Goals reviewed with patient? No  SHORT TERM GOALS: Target date: 11/12/2022  Pt will be independent with HEP in order to improve strength and balance in order to decrease fall risk and improve function at home. Baseline: instructed in HEP today, to provide printout next visit Goal status: MET    LONG TERM GOALS: Target date: 12/02/2023  Pt will increase FOTO to at least 50 in order to demonstrate improvement in  function related to mobility and QoL. Baseline: 44 7/29:48 9/10:40 12/24/2022= 51 Goal status: MET  2.  Pt (< 60 yrs old) will complete 5xSTS test < 30 seconds indicating an increase in LE strength and improved balance. Baseline: 10/01/22 34 sec (multiple initial attempts with posterior LOB in chair) 12/20: 49 seconds 11/04/22:1:06, min A on multiple attempts to maintain balance and pt stays in flexed posture throughout test when coming to standing.   9/10: 27.5 sec, Pushes WC post consistently, close CGA  10/22: 47 seconds first attempt 12/5: 1 minute, 37 seconds, multiple attempts to get first stand, but no difficulty after that; 03/20/2023= 1 min 2 sec with 1UE on walker and 1 on armrest- some posterior lean- Close CGA; 06/17/2023= 38 sec with 1 UE on walker and 1 on armrest; 08/21/2023= 35 sec with 1UE on walker and 1 on armrest.  Goal status: ONGOING  3.  Pt will increase to > 0.5 m/s (30 sec) as to improve gait speed for better home ambulation and reduce fall risk. Baseline: 10/01/22 0.096 m/s with RW, close CGA, and WC follow previously 12/27: 0.11 m/s 11/04/22: .056 m/s  9/10: 076 m/s 10/22: 0.64 m/s  11/26: .076 m/s; 03/20/2023= 0.08 m/s; 04/22/2023= 0.089 m/s; 05/15/2023 = 0.079 m/s; 05/27/2023= 0.11 m/s; 07/17/2023= 0.15 m/s; 08/21/2023= 0.14 m/s avg with Bariatric walker; 09/09/2023= 0.14 m/s  Goal status: PROGRESSING  4.  Pt will increase hip flexor and abductor  gross strength to 4+/5 bilat as to improve functional strength for independent gait, increased standing tolerance, and increased ADL ability. Baseline: 10/01/22 4+/5, exception hip flexors and abductors 4/5 each bilat  (previously 03/27/22: 4+/5 ) 9/10: Hip flexor 4/5, hip abductor 4+/5 10/22: hip flexor 4/5 hip abductor 4+/5 11/26: hip flexor 4+/5 hip abductor 4+/5 Goal status: MET  5.  Pt will ambulate 150 ft with BRW and WC follow with no rest breaks in less than 12 min to allow for improved mobility within home and increased  independence. Baseline: 10/03/22 150' with BRW 04/03/22: 105 ft with BRW 7/29: 100 in 9:30 with pediatric RW 9/10:101 ft with BRW stooped posture worsened throughout test 12/24/2022- 150 feet using BRW, CGA with w/c follow- in 10 min 17 sec. Will  keep goal to make sure he can do this consistently 10/22: 119 ft with BRW 8 min 23 seconds terminated by patient  11/26: 148 ft with BRW 14 min 34 seconds terminated by patient 03/20/2023= 152 feet using BRW, CGA and w/c follow in 14 min 3 sec- Need to keep goal active to ensure consistency but will revised and add a time element to goal. 04/22/2023= 162 feet in 11 min 54 sec using bariatric RW, CGA and close CGA assist; 05/27/2023- 170 feet without rest break using bariatric RW. 06/17/2023= 160 feet with bariatric 4WW in 9 min- stopped due to fatigue.  Goal status: MET   6. Pt will decrease TUG to below 1 min 30 seconds/decrease in order to demonstrate decreased fall risk. Baseline: 06/17/2023= 2 min using bariatric 4WW; 09/09/2023= 1 min 50 sec with bariatric front wheeled walker.  Goal status: PROGRESSING  7. Pt will ambulate 300 ft with BRW, CGA and WC follow with no rest breaks in less than 20 min to allow for improved mobility within home/community and increased independence. Baseline: 06/17/2023= 160 feet with bariatric 4WW in 9 min- stopped due to fatigue. 07/17/2023- Patient able to walk 270 feet in > 20 min with BRW, CGA and close w/c follow today- VC for posture and for gait sequencing; 08/21/2023- Not specifically testing but patient has been walking 160 feet or more each Visit; 09/09/2023= Patient ambulated 180 feet overall at one time today using bariatric RW, CGA with VC for erect posture and for increased step length.  Goal Status: PROGRESSING    ASSESSMENT :  CLINICAL IMPRESSION:   Patient performed well for recert visit. He was able to demonstrate imrpoved overall gait speed and TUG score vs initial eval and overall progressing with gait distance  with less UE support. All remaining goals are still appropriate at this time. Patient's condition has the potential to improve in response to therapy. Maximum improvement is yet to be obtained. The anticipated improvement is attainable and reasonable in a generally predictable time. Patient will benefit from continued skilled physical therapy to further progress his strength and balance to decrease fall risk, improve QOL, and attain therapy goals.    OBJECTIVE IMPAIRMENTS Abnormal gait, decreased activity tolerance, decreased balance, decreased coordination, decreased endurance, decreased mobility, difficulty walking, decreased ROM, decreased strength, improper body mechanics, postural dysfunction, and obesity.   ACTIVITY LIMITATIONS carrying, lifting, bending, sitting, standing, squatting, stairs, transfers, bed mobility, continence, bathing, toileting, dressing, hygiene/grooming, and locomotion level  PARTICIPATION LIMITATIONS: meal prep, cleaning, laundry, interpersonal relationship, driving, shopping, community activity, and yard work  PERSONAL FACTORS Fitness, Past/current experiences, Time since onset of injury/illness/exacerbation, and 3+ comorbidities: hx of brain tumor, past CVA, HTN, obesity, learning disability, DMII are also affecting patient's functional outcome.   REHAB POTENTIAL: Fair    CLINICAL DECISION MAKING: Stable/uncomplicated  EVALUATION COMPLEXITY: Low  PLAN: PT FREQUENCY: 1-2x/week  PT DURATION: 12 weeks  PLANNED INTERVENTIONS: Therapeutic exercises, Therapeutic activity, Neuromuscular re-education, Balance training, Gait training, Patient/Family education, Joint mobilization, Stair training, Vestibular training, Orthotic/Fit training, DME instructions, Dry Needling, Electrical stimulation, Wheelchair mobility training, Cryotherapy, Moist heat, Compression bandaging, Manual therapy, and Re-evaluation  PLAN FOR NEXT SESSION:    -Continue with functional  strengthening exercises of the LE's  -Gait activities using RW. Incorporate proper technique with all transfers to decrease his risk of falling.     Murlene Army, PT Physical Therapist - [facility]  1:48 PM 09/10/23

## 2023-09-11 ENCOUNTER — Ambulatory Visit: Payer: 59

## 2023-09-16 ENCOUNTER — Ambulatory Visit: Payer: 59

## 2023-09-18 ENCOUNTER — Ambulatory Visit: Payer: 59

## 2023-09-18 DIAGNOSIS — R262 Difficulty in walking, not elsewhere classified: Secondary | ICD-10-CM

## 2023-09-18 DIAGNOSIS — R2689 Other abnormalities of gait and mobility: Secondary | ICD-10-CM

## 2023-09-18 DIAGNOSIS — R296 Repeated falls: Secondary | ICD-10-CM

## 2023-09-18 DIAGNOSIS — R278 Other lack of coordination: Secondary | ICD-10-CM

## 2023-09-18 DIAGNOSIS — M6281 Muscle weakness (generalized): Secondary | ICD-10-CM

## 2023-09-18 DIAGNOSIS — R269 Unspecified abnormalities of gait and mobility: Secondary | ICD-10-CM

## 2023-09-18 DIAGNOSIS — R2681 Unsteadiness on feet: Secondary | ICD-10-CM

## 2023-09-18 NOTE — Therapy (Signed)
 OUTPATIENT PHYSICAL THERAPY NEURO TREATMENT  Patient Name: Curtis Powell MRN: [id number] DOB:11-Jul-1965, 58 y.o., male Today's Date: 09/18/2023  PCP: Rex Castor, MD  REFERRING PROVIDER: Rex Castor, MD    PT End of Session - 09/18/23 1327     Visit Number 86    Number of Visits 109    Date for PT Re-Evaluation 12/02/23    Progress Note Due on Visit 90    PT Start Time 1317    PT Stop Time 1357    PT Time Calculation (min) 40 min    Equipment Utilized During Treatment Gait belt    Activity Tolerance Patient tolerated treatment well    Behavior During Therapy WFL for tasks assessed/performed               Past Medical History:  Diagnosis Date   Asthma    GERD (gastroesophageal reflux disease)    Hyperlipidemia    Hypertension    Obesity    Stroke [facility])    Past Surgical History:  Procedure Laterality Date   BRAIN SURGERY     Patient Active Problem List   Diagnosis Date Noted   Impaired glucose tolerance 03/23/2020   Learning disability 03/23/2020   Urinary and fecal incontinence 06/14/2019   Chronic pain syndrome 03/23/2019   Constipation 03/23/2019   Dysarthria 03/23/2019   Dysphagia 03/23/2019   Learning difficulty 03/23/2019   Morbid obesity (HCC) 03/23/2019   Muscle weakness 03/23/2019   Vitamin D deficiency 03/23/2019   Bilateral carotid artery stenosis 12/15/2018   Moderate aortic valve stenosis 12/15/2018   BMI 45.0-49.9, adult (HCC) 11/10/2018   Cerebrovascular accident (HCC) 05/02/2016   Hemiparesis affecting right side as late effect of cerebrovascular accident (HCC) 05/01/2016   Brain tumor (HCC) 11/24/2014   Gastro-esophageal reflux disease without esophagitis 11/24/2014   Accident due to mechanical fall without injury 08/03/2014   Essential hypertension 06/13/2014   Mixed hyperlipidemia 06/13/2014   Unspecified sequelae of cerebral infarction 06/13/2014   Thyromegaly 01/07/2014   Type 2 diabetes mellitus without  complication (HCC) 01/06/2014   Neck mass 09/08/2013   Swimmer's ear 09/08/2013   Erectile dysfunction 01/03/2013   Prediabetes 01/03/2013   ONSET DATE: 05/09/2016  REFERRING DIAG: W09.811 (ICD-10-CM) - Hemiplegia and hemiparesis following cerebral infarction affecting right dominant side   THERAPY DIAG:  Difficulty in walking, not elsewhere classified  Unsteadiness on feet  Muscle weakness (generalized)  Other abnormalities of gait and mobility  Abnormality of gait and mobility  Repeated falls  Other lack of coordination   Rationale for Evaluation and Treatment Rehabilitation  SUBJECTIVE:  SUBJECTIVE STATEMENT    Patient reports doing okay but did report a recent fall on Tues - was walking at home and legs gave way. Denies any pain or injury.  Reports has another new paid caregiver who started last week.   Pt accompanied by: self   PERTINENT HISTORY:   The pt is a pleasant 58 yo male who returns to PT for evaluation of deficits s/o old CVA. Pt with hx of CVA Feb 2018. He was previously fully independent, but CVA caused significant deficits, mainly affecting his right side. Pt reports continued difficulty with walking and transfers. Pt still using WC for majority of mobility, otherwise he uses his RW. Pt lives with his mom who is his main caregiver. Patient had outpatient PT previously in clinic in 2023. He reports since discharge he has been completing his HEP and walking about every day. Pt would like to improve his walking mainly, but also would like to work on balance. He also reports difficulty with transfers. Pt's mom and his caregiver helps him with ADLs. However, he says he is now waiting for a new caregiver. Prior to this pt had caregiver 3x/week. PMH significant for hx of brain tumor,  past CVA, DMII and HTN. See chart for additional information.  PAIN:  Are you having pain? Pt reports no pain currently  PRECAUTIONS: fall  WEIGHT BEARING RESTRICTIONS none  PLOF: required assistance with ADLs (old CVA), prior to CVA in 2018 was indep.  History of Falls: Pt reports no recent falls  Home information: Pt lives with mother who is his primary caregiver. Previously had home aide, 3x/week but reports currently waiting for new one.  PATIENT GOALS: Pt would like to improve his ability to ambulate.  OBJECTIVE:  taken at [facility] unless otherwise noted:   DIAGNOSTIC FINDINGS: No recent imaging per chart  Objective measures taken at eval unless otherwise specified:   Posture: forward head posture, rounded shoulders  MMT LE: grossly  4+/5, exception hip flexors and abductors 4/5 each bilat   Functional tests:  5xSTS:  34 sec, use of BUE (multiple initial attempts with posterior LOB in chair)   : 0.096 m/s with RW, close CGA, and WC follow  Gait for distance (goal 150 ft): deferred due to time  Gait mechanics: impaired, decreased speed (see ), crouched posture, poor postural stability and relies heavily on BUE support on RW. Pt now with bilat AFOS   FOTO: 44 (goal 50)    TODAY'S TREATMENT  09/18/23    Wearing B LE custom AFOs. PT applied gait belt at start of session, CGA provided throughout session, unless otherwise stated for improved safety    TA:  FWD/retro gait in // bars x 2  Standing hip flex stretch x 30 sec x 3 each LE (front foot on 6 step) Step tap x 15 reps alt LE with BUE Support and VC for erect posture.  Standing hip ext (kicking back to touch or knock over red cone)  x 20 reps  Stepping over threshold (pool noodle placed on floor inside of // bars) - each LE up/over x 10. (Difficulty with fwd and retro stepping)      PATIENT EDUCATION: Education details: Pt educated throughout session about proper posture and technique with  exercises. Improved exercise technique, movement at target joints, use of target muscles after min to mod verbal, visual, tactile cues.  Person educated: Patient Education method: Explanation, Demonstration, Tactile cues, and Verbal cues Education comprehension: verbalized understanding, returned demonstration, and  verbal cues required  HOME EXERCISE PROGRAM:  Standing hip flexor stretch at counter, prolonged standing with erect posture for gluteal activation, HS curls with RTB  Added the below HEP on 05/15/23  Access Code: [email or web address] URL: [email or web address] Date: 05/15/2023 Prepared by: Carlen Chasten  Exercises - Seated Long Arc Quad with Ankle Weight  - 1 x daily - 7 x weekly - 2 sets - 10 reps   GOALS: Goals reviewed with patient? No  SHORT TERM GOALS: Target date: 11/12/2022  Pt will be independent with HEP in order to improve strength and balance in order to decrease fall risk and improve function at home. Baseline: instructed in HEP today, to provide printout next visit Goal status: MET    LONG TERM GOALS: Target date: 12/02/2023  Pt will increase FOTO to at least 50 in order to demonstrate improvement in function related to mobility and QoL. Baseline: 44 7/29:48 9/10:40 12/24/2022= 51 Goal status: MET  2.  Pt (< 60 yrs old) will complete 5xSTS test < 30 seconds indicating an increase in LE strength and improved balance. Baseline: 10/01/22 34 sec (multiple initial attempts with posterior LOB in chair) 12/20: 49 seconds 11/04/22:1:06, min A on multiple attempts to maintain balance and pt stays in flexed posture throughout test when coming to standing.   9/10: 27.5 sec, Pushes WC post consistently, close CGA  10/22: 47 seconds first attempt 12/5: 1 minute, 37 seconds, multiple attempts to get first stand, but no difficulty after that; 03/20/2023= 1 min 2 sec with 1UE on walker and 1 on armrest- some posterior lean- Close CGA; 06/17/2023= 38 sec with 1 UE on walker  and 1 on armrest; 08/21/2023= 35 sec with 1UE on walker and 1 on armrest.  Goal status: ONGOING  3.  Pt will increase to > 0.5 m/s (30 sec) as to improve gait speed for better home ambulation and reduce fall risk. Baseline: 10/01/22 0.096 m/s with RW, close CGA, and WC follow previously 12/27: 0.11 m/s 11/04/22: .056 m/s  9/10: 076 m/s 10/22: 0.64 m/s  11/26: .076 m/s; 03/20/2023= 0.08 m/s; 04/22/2023= 0.089 m/s; 05/15/2023 = 0.079 m/s; 05/27/2023= 0.11 m/s; 07/17/2023= 0.15 m/s; 08/21/2023= 0.14 m/s avg with Bariatric walker; 09/09/2023= 0.14 m/s  Goal status: PROGRESSING  4.  Pt will increase hip flexor and abductor  gross strength to 4+/5 bilat as to improve functional strength for independent gait, increased standing tolerance, and increased ADL ability. Baseline: 10/01/22 4+/5, exception hip flexors and abductors 4/5 each bilat  (previously 03/27/22: 4+/5 ) 9/10: Hip flexor 4/5, hip abductor 4+/5 10/22: hip flexor 4/5 hip abductor 4+/5 11/26: hip flexor 4+/5 hip abductor 4+/5 Goal status: MET  5.  Pt will ambulate 150 ft with BRW and WC follow with no rest breaks in less than 12 min to allow for improved mobility within home and increased independence. Baseline: 10/03/22 150' with BRW 04/03/22: 105 ft with BRW 7/29: 100 in 9:30 with pediatric RW 9/10:101 ft with BRW stooped posture worsened throughout test 12/24/2022- 150 feet using BRW, CGA with w/c follow- in 10 min 17 sec. Will keep goal to make sure he can do this consistently 10/22: 119 ft with BRW 8 min 23 seconds terminated by patient  11/26: 148 ft with BRW 14 min 34 seconds terminated by patient 03/20/2023= 152 feet using BRW, CGA and w/c follow in 14 min 3 sec- Need to keep goal active to ensure consistency but will revised and add a time element to goal. 04/22/2023= 162 feet  in 11 min 54 sec using bariatric RW, CGA and close CGA assist; 05/27/2023- 170 feet without rest break using bariatric RW. 06/17/2023= 160 feet with bariatric 4WW in 9  min- stopped due to fatigue.  Goal status: MET   6. Pt will decrease TUG to below 1 min 30 seconds/decrease in order to demonstrate decreased fall risk. Baseline: 06/17/2023= 2 min using bariatric 4WW; 09/09/2023= 1 min 50 sec with bariatric front wheeled walker.  Goal status: PROGRESSING  7. Pt will ambulate 300 ft with BRW, CGA and WC follow with no rest breaks in less than 20 min to allow for improved mobility within home/community and increased independence. Baseline: 06/17/2023= 160 feet with bariatric 4WW in 9 min- stopped due to fatigue. 07/17/2023- Patient able to walk 270 feet in > 20 min with BRW, CGA and close w/c follow today- VC for posture and for gait sequencing; 08/21/2023- Not specifically testing but patient has been walking 160 feet or more each Visit; 09/09/2023= Patient ambulated 180 feet overall at one time today using bariatric RW, CGA with VC for erect posture and for increased step length.  Goal Status: PROGRESSING    ASSESSMENT :  CLINICAL IMPRESSION:   Patient presented with good motivation. Presents with improving ability to increase step height with step taps and lifting LE up/over threshold. Patient will benefit from continued skilled physical therapy to further progress his strength and balance to decrease fall risk, improve QOL, and attain therapy goals.    OBJECTIVE IMPAIRMENTS Abnormal gait, decreased activity tolerance, decreased balance, decreased coordination, decreased endurance, decreased mobility, difficulty walking, decreased ROM, decreased strength, improper body mechanics, postural dysfunction, and obesity.   ACTIVITY LIMITATIONS carrying, lifting, bending, sitting, standing, squatting, stairs, transfers, bed mobility, continence, bathing, toileting, dressing, hygiene/grooming, and locomotion level  PARTICIPATION LIMITATIONS: meal prep, cleaning, laundry, interpersonal relationship, driving, shopping, community activity, and yard work  PERSONAL FACTORS  Fitness, Past/current experiences, Time since onset of injury/illness/exacerbation, and 3+ comorbidities: hx of brain tumor, past CVA, HTN, obesity, learning disability, DMII are also affecting patient's functional outcome.   REHAB POTENTIAL: Fair    CLINICAL DECISION MAKING: Stable/uncomplicated  EVALUATION COMPLEXITY: Low  PLAN: PT FREQUENCY: 1-2x/week  PT DURATION: 12 weeks  PLANNED INTERVENTIONS: Therapeutic exercises, Therapeutic activity, Neuromuscular re-education, Balance training, Gait training, Patient/Family education, Joint mobilization, Stair training, Vestibular training, Orthotic/Fit training, DME instructions, Dry Needling, Electrical stimulation, Wheelchair mobility training, Cryotherapy, Moist heat, Compression bandaging, Manual therapy, and Re-evaluation  PLAN FOR NEXT SESSION:    -Continue with functional strengthening exercises of the LE's  -Gait activities using RW. Incorporate proper technique with all transfers to decrease his risk of falling.     Murlene Army, PT Physical Therapist - [place]  [facility]  2:11 PM 09/18/23

## 2023-09-23 ENCOUNTER — Ambulatory Visit: Payer: 59

## 2023-09-23 DIAGNOSIS — R2681 Unsteadiness on feet: Secondary | ICD-10-CM

## 2023-09-23 DIAGNOSIS — R269 Unspecified abnormalities of gait and mobility: Secondary | ICD-10-CM

## 2023-09-23 DIAGNOSIS — R278 Other lack of coordination: Secondary | ICD-10-CM

## 2023-09-23 DIAGNOSIS — R262 Difficulty in walking, not elsewhere classified: Secondary | ICD-10-CM

## 2023-09-23 DIAGNOSIS — M6281 Muscle weakness (generalized): Secondary | ICD-10-CM

## 2023-09-23 DIAGNOSIS — R296 Repeated falls: Secondary | ICD-10-CM

## 2023-09-23 DIAGNOSIS — R2689 Other abnormalities of gait and mobility: Secondary | ICD-10-CM

## 2023-09-23 NOTE — Therapy (Signed)
 OUTPATIENT PHYSICAL THERAPY NEURO TREATMENT  Patient Name: Curtis Powell MRN: [id number] DOB:1965/12/16, 58 y.o., male Today's Date: 09/23/2023  PCP: Rex Castor, MD  REFERRING PROVIDER: Rex Castor, MD    PT End of Session - 09/23/23 1302     Visit Number 87    Number of Visits 109    Date for PT Re-Evaluation 12/02/23    Progress Note Due on Visit 90    PT Start Time 1310    PT Stop Time 1355    PT Time Calculation (min) 45 min    Equipment Utilized During Treatment Gait belt    Activity Tolerance Patient tolerated treatment well    Behavior During Therapy WFL for tasks assessed/performed               Past Medical History:  Diagnosis Date   Asthma    GERD (gastroesophageal reflux disease)    Hyperlipidemia    Hypertension    Obesity    Stroke [facility])    Past Surgical History:  Procedure Laterality Date   BRAIN SURGERY     Patient Active Problem List   Diagnosis Date Noted   Impaired glucose tolerance 03/23/2020   Learning disability 03/23/2020   Urinary and fecal incontinence 06/14/2019   Chronic pain syndrome 03/23/2019   Constipation 03/23/2019   Dysarthria 03/23/2019   Dysphagia 03/23/2019   Learning difficulty 03/23/2019   Morbid obesity (HCC) 03/23/2019   Muscle weakness 03/23/2019   Vitamin D deficiency 03/23/2019   Bilateral carotid artery stenosis 12/15/2018   Moderate aortic valve stenosis 12/15/2018   BMI 45.0-49.9, adult (HCC) 11/10/2018   Cerebrovascular accident (HCC) 05/02/2016   Hemiparesis affecting right side as late effect of cerebrovascular accident (HCC) 05/01/2016   Brain tumor (HCC) 11/24/2014   Gastro-esophageal reflux disease without esophagitis 11/24/2014   Accident due to mechanical fall without injury 08/03/2014   Essential hypertension 06/13/2014   Mixed hyperlipidemia 06/13/2014   Unspecified sequelae of cerebral infarction 06/13/2014   Thyromegaly 01/07/2014   Type 2 diabetes mellitus without  complication (HCC) 01/06/2014   Neck mass 09/08/2013   Swimmer's ear 09/08/2013   Erectile dysfunction 01/03/2013   Prediabetes 01/03/2013   ONSET DATE: 05/09/2016  REFERRING DIAG: W29.562 (ICD-10-CM) - Hemiplegia and hemiparesis following cerebral infarction affecting right dominant side   THERAPY DIAG:  Difficulty in walking, not elsewhere classified  Unsteadiness on feet  Muscle weakness (generalized)  Other abnormalities of gait and mobility  Abnormality of gait and mobility  Other lack of coordination  Repeated falls   Rationale for Evaluation and Treatment Rehabilitation  SUBJECTIVE:  SUBJECTIVE STATEMENT    Patient reports having a good week. I have a new caregiver.    Pt accompanied by: self   PERTINENT HISTORY:   The pt is a pleasant 58 yo male who returns to PT for evaluation of deficits s/o old CVA. Pt with hx of CVA Feb 2018. He was previously fully independent, but CVA caused significant deficits, mainly affecting his right side. Pt reports continued difficulty with walking and transfers. Pt still using WC for majority of mobility, otherwise he uses his RW. Pt lives with his mom who is his main caregiver. Patient had outpatient PT previously in clinic in 2023. He reports since discharge he has been completing his HEP and walking about every day. Pt would like to improve his walking mainly, but also would like to work on balance. He also reports difficulty with transfers. Pt's mom and his caregiver helps him with ADLs. However, he says he is now waiting for a new caregiver. Prior to this pt had caregiver 3x/week. PMH significant for hx of brain tumor, past CVA, DMII and HTN. See chart for additional information.  PAIN:  Are you having pain? Pt reports no pain  currently  PRECAUTIONS: fall  WEIGHT BEARING RESTRICTIONS none  PLOF: required assistance with ADLs (old CVA), prior to CVA in 2018 was indep.  History of Falls: Pt reports no recent falls  Home information: Pt lives with mother who is his primary caregiver. Previously had home aide, 3x/week but reports currently waiting for new one.  PATIENT GOALS: Pt would like to improve his ability to ambulate.  OBJECTIVE:  taken at [facility] unless otherwise noted:   DIAGNOSTIC FINDINGS: No recent imaging per chart  Objective measures taken at eval unless otherwise specified:   Posture: forward head posture, rounded shoulders  MMT LE: grossly  4+/5, exception hip flexors and abductors 4/5 each bilat   Functional tests:  5xSTS:  34 sec, use of BUE (multiple initial attempts with posterior LOB in chair)   : 0.096 m/s with RW, close CGA, and WC follow  Gait for distance (goal 150 ft): deferred due to time  Gait mechanics: impaired, decreased speed (see ), crouched posture, poor postural stability and relies heavily on BUE support on RW. Pt now with bilat AFOS   FOTO: 44 (goal 50)    TODAY'S TREATMENT  09/23/23    Wearing B LE custom AFOs. PT applied gait belt at start of session, CGA provided throughout session, unless otherwise stated for improved safety    TA:   Static stand (attempted without UE Support) unable today initially- stood with BUE Support x 5 min Static stand Without UE Support x 3 min Static stand (placing magnet letters in alpha order) then in colored order for 2 (10+ min ) sessions - Focusing on LE weight bearing  Standing hip march alt LE x 15 reps.     PATIENT EDUCATION: Education details: Pt educated throughout session about proper posture and technique with exercises. Improved exercise technique, movement at target joints, use of target muscles after min to mod verbal, visual, tactile cues.  Person educated: Patient Education method: Explanation,  Demonstration, Tactile cues, and Verbal cues Education comprehension: verbalized understanding, returned demonstration, and verbal cues required  HOME EXERCISE PROGRAM:  Standing hip flexor stretch at counter, prolonged standing with erect posture for gluteal activation, HS curls with RTB  Added the below HEP on 05/15/23  Access Code: [email or web address] URL: [email or web address] Date: 05/15/2023 Prepared by: Carlen Chasten  Exercises - Seated  Long [employer] with Ankle Weight  - 1 x daily - 7 x weekly - 2 sets - 10 reps   GOALS: Goals reviewed with patient? No  SHORT TERM GOALS: Target date: 11/12/2022  Pt will be independent with HEP in order to improve strength and balance in order to decrease fall risk and improve function at home. Baseline: instructed in HEP today, to provide printout next visit Goal status: MET    LONG TERM GOALS: Target date: 12/02/2023  Pt will increase FOTO to at least 50 in order to demonstrate improvement in function related to mobility and QoL. Baseline: 44 7/29:48 9/10:40 12/24/2022= 51 Goal status: MET  2.  Pt (< 60 yrs old) will complete 5xSTS test < 30 seconds indicating an increase in LE strength and improved balance. Baseline: 10/01/22 34 sec (multiple initial attempts with posterior LOB in chair) 12/20: 49 seconds 11/04/22:1:06, min A on multiple attempts to maintain balance and pt stays in flexed posture throughout test when coming to standing.   9/10: 27.5 sec, Pushes WC post consistently, close CGA  10/22: 47 seconds first attempt 12/5: 1 minute, 37 seconds, multiple attempts to get first stand, but no difficulty after that; 03/20/2023= 1 min 2 sec with 1UE on walker and 1 on armrest- some posterior lean- Close CGA; 06/17/2023= 38 sec with 1 UE on walker and 1 on armrest; 08/21/2023= 35 sec with 1UE on walker and 1 on armrest.  Goal status: ONGOING  3.  Pt will increase to > 0.5 m/s (30 sec) as to improve gait speed for better home  ambulation and reduce fall risk. Baseline: 10/01/22 0.096 m/s with RW, close CGA, and WC follow previously 12/27: 0.11 m/s 11/04/22: .056 m/s  9/10: 076 m/s 10/22: 0.64 m/s  11/26: .076 m/s; 03/20/2023= 0.08 m/s; 04/22/2023= 0.089 m/s; 05/15/2023 = 0.079 m/s; 05/27/2023= 0.11 m/s; 07/17/2023= 0.15 m/s; 08/21/2023= 0.14 m/s avg with Bariatric walker; 09/09/2023= 0.14 m/s  Goal status: PROGRESSING  4.  Pt will increase hip flexor and abductor  gross strength to 4+/5 bilat as to improve functional strength for independent gait, increased standing tolerance, and increased ADL ability. Baseline: 10/01/22 4+/5, exception hip flexors and abductors 4/5 each bilat  (previously 03/27/22: 4+/5 ) 9/10: Hip flexor 4/5, hip abductor 4+/5 10/22: hip flexor 4/5 hip abductor 4+/5 11/26: hip flexor 4+/5 hip abductor 4+/5 Goal status: MET  5.  Pt will ambulate 150 ft with BRW and WC follow with no rest breaks in less than 12 min to allow for improved mobility within home and increased independence. Baseline: 10/03/22 150' with BRW 04/03/22: 105 ft with BRW 7/29: 100 in 9:30 with pediatric RW 9/10:101 ft with BRW stooped posture worsened throughout test 12/24/2022- 150 feet using BRW, CGA with w/c follow- in 10 min 17 sec. Will keep goal to make sure he can do this consistently 10/22: 119 ft with BRW 8 min 23 seconds terminated by patient  11/26: 148 ft with BRW 14 min 34 seconds terminated by patient 03/20/2023= 152 feet using BRW, CGA and w/c follow in 14 min 3 sec- Need to keep goal active to ensure consistency but will revised and add a time element to goal. 04/22/2023= 162 feet in 11 min 54 sec using bariatric RW, CGA and close CGA assist; 05/27/2023- 170 feet without rest break using bariatric RW. 06/17/2023= 160 feet with bariatric 4WW in 9 min- stopped due to fatigue.  Goal status: MET   6. Pt will decrease TUG to below 1  min 30 seconds/decrease in order to demonstrate decreased fall risk. Baseline: 06/17/2023= 2 min  using bariatric 4WW; 09/09/2023= 1 min 50 sec with bariatric front wheeled walker.  Goal status: PROGRESSING  7. Pt will ambulate 300 ft with BRW, CGA and WC follow with no rest breaks in less than 20 min to allow for improved mobility within home/community and increased independence. Baseline: 06/17/2023= 160 feet with bariatric 4WW in 9 min- stopped due to fatigue. 07/17/2023- Patient able to walk 270 feet in > 20 min with BRW, CGA and close w/c follow today- VC for posture and for gait sequencing; 08/21/2023- Not specifically testing but patient has been walking 160 feet or more each Visit; 09/09/2023= Patient ambulated 180 feet overall at one time today using bariatric RW, CGA with VC for erect posture and for increased step length.  Goal Status: PROGRESSING    ASSESSMENT :  CLINICAL IMPRESSION:   Patient presented with initial difficulty standing- unable to static stand without UE support- mostly posterior lean- did improve with practive today. Patient progressed with standing throughout session. He was able to stand with 1 UE support and dynamic reach for majority of session and activated glutes well today. Patient will benefit from continued skilled physical therapy to further progress his strength and balance to decrease fall risk, improve QOL, and attain therapy goals.    OBJECTIVE IMPAIRMENTS Abnormal gait, decreased activity tolerance, decreased balance, decreased coordination, decreased endurance, decreased mobility, difficulty walking, decreased ROM, decreased strength, improper body mechanics, postural dysfunction, and obesity.   ACTIVITY LIMITATIONS carrying, lifting, bending, sitting, standing, squatting, stairs, transfers, bed mobility, continence, bathing, toileting, dressing, hygiene/grooming, and locomotion level  PARTICIPATION LIMITATIONS: meal prep, cleaning, laundry, interpersonal relationship, driving, shopping, community activity, and yard work  PERSONAL FACTORS Fitness,  Past/current experiences, Time since onset of injury/illness/exacerbation, and 3+ comorbidities: hx of brain tumor, past CVA, HTN, obesity, learning disability, DMII are also affecting patient's functional outcome.   REHAB POTENTIAL: Fair    CLINICAL DECISION MAKING: Stable/uncomplicated  EVALUATION COMPLEXITY: Low  PLAN: PT FREQUENCY: 1-2x/week  PT DURATION: 12 weeks  PLANNED INTERVENTIONS: Therapeutic exercises, Therapeutic activity, Neuromuscular re-education, Balance training, Gait training, Patient/Family education, Joint mobilization, Stair training, Vestibular training, Orthotic/Fit training, DME instructions, Dry Needling, Electrical stimulation, Wheelchair mobility training, Cryotherapy, Moist heat, Compression bandaging, Manual therapy, and Re-evaluation  PLAN FOR NEXT SESSION:    -Continue with functional strengthening exercises of the LE's  -Gait activities using RW. Incorporate proper technique with all transfers to decrease his risk of falling.     Murlene Army, PT Physical Therapist - [place]  [facility] 2  2:00 PM 09/23/23

## 2023-09-25 ENCOUNTER — Ambulatory Visit: Payer: 59

## 2023-09-25 DIAGNOSIS — R2681 Unsteadiness on feet: Secondary | ICD-10-CM

## 2023-09-25 DIAGNOSIS — R269 Unspecified abnormalities of gait and mobility: Secondary | ICD-10-CM

## 2023-09-25 DIAGNOSIS — R262 Difficulty in walking, not elsewhere classified: Secondary | ICD-10-CM

## 2023-09-25 DIAGNOSIS — R278 Other lack of coordination: Secondary | ICD-10-CM

## 2023-09-25 DIAGNOSIS — R2689 Other abnormalities of gait and mobility: Secondary | ICD-10-CM

## 2023-09-25 DIAGNOSIS — M6281 Muscle weakness (generalized): Secondary | ICD-10-CM

## 2023-09-25 DIAGNOSIS — R296 Repeated falls: Secondary | ICD-10-CM

## 2023-09-25 NOTE — Therapy (Signed)
 OUTPATIENT PHYSICAL THERAPY NEURO TREATMENT  Patient Name: Curtis Powell MRN: [id number] DOB:Mar 05, 1966, 58 y.o., male Today's Date: 09/25/2023  PCP: Rex Castor, MD  REFERRING PROVIDER: Rex Castor, MD    PT End of Session - 09/25/23 1323     Visit Number 88    Number of Visits 109    Date for PT Re-Evaluation 12/02/23    Progress Note Due on Visit 90    PT Start Time 1317    PT Stop Time 1358    PT Time Calculation (min) 41 min    Equipment Utilized During Treatment Gait belt    Activity Tolerance Patient tolerated treatment well    Behavior During Therapy WFL for tasks assessed/performed               Past Medical History:  Diagnosis Date   Asthma    GERD (gastroesophageal reflux disease)    Hyperlipidemia    Hypertension    Obesity    Stroke [facility])    Past Surgical History:  Procedure Laterality Date   BRAIN SURGERY     Patient Active Problem List   Diagnosis Date Noted   Impaired glucose tolerance 03/23/2020   Learning disability 03/23/2020   Urinary and fecal incontinence 06/14/2019   Chronic pain syndrome 03/23/2019   Constipation 03/23/2019   Dysarthria 03/23/2019   Dysphagia 03/23/2019   Learning difficulty 03/23/2019   Morbid obesity (HCC) 03/23/2019   Muscle weakness 03/23/2019   Vitamin D deficiency 03/23/2019   Bilateral carotid artery stenosis 12/15/2018   Moderate aortic valve stenosis 12/15/2018   BMI 45.0-49.9, adult (HCC) 11/10/2018   Cerebrovascular accident (HCC) 05/02/2016   Hemiparesis affecting right side as late effect of cerebrovascular accident (HCC) 05/01/2016   Brain tumor (HCC) 11/24/2014   Gastro-esophageal reflux disease without esophagitis 11/24/2014   Accident due to mechanical fall without injury 08/03/2014   Essential hypertension 06/13/2014   Mixed hyperlipidemia 06/13/2014   Unspecified sequelae of cerebral infarction 06/13/2014   Thyromegaly 01/07/2014   Type 2 diabetes mellitus without  complication (HCC) 01/06/2014   Neck mass 09/08/2013   Swimmer's ear 09/08/2013   Erectile dysfunction 01/03/2013   Prediabetes 01/03/2013   ONSET DATE: 05/09/2016  REFERRING DIAG: W09.811 (ICD-10-CM) - Hemiplegia and hemiparesis following cerebral infarction affecting right dominant side   THERAPY DIAG:  Difficulty in walking, not elsewhere classified  Unsteadiness on feet  Muscle weakness (generalized)  Other abnormalities of gait and mobility  Abnormality of gait and mobility  Other lack of coordination  Repeated falls   Rationale for Evaluation and Treatment Rehabilitation  SUBJECTIVE:  SUBJECTIVE STATEMENT    Patient reports no changes since last visit. Reports feeling tired.   Pt accompanied by: self   PERTINENT HISTORY:   The pt is a pleasant 58 yo male who returns to PT for evaluation of deficits s/o old CVA. Pt with hx of CVA Feb 2018. He was previously fully independent, but CVA caused significant deficits, mainly affecting his right side. Pt reports continued difficulty with walking and transfers. Pt still using WC for majority of mobility, otherwise he uses his RW. Pt lives with his mom who is his main caregiver. Patient had outpatient PT previously in clinic in 2023. He reports since discharge he has been completing his HEP and walking about every day. Pt would like to improve his walking mainly, but also would like to work on balance. He also reports difficulty with transfers. Pt's mom and his caregiver helps him with ADLs. However, he says he is now waiting for a new caregiver. Prior to this pt had caregiver 3x/week. PMH significant for hx of brain tumor, past CVA, DMII and HTN. See chart for additional information.  PAIN:  Are you having pain? Pt reports no pain  currently  PRECAUTIONS: fall  WEIGHT BEARING RESTRICTIONS none  PLOF: required assistance with ADLs (old CVA), prior to CVA in 2018 was indep.  History of Falls: Pt reports no recent falls  Home information: Pt lives with mother who is his primary caregiver. Previously had home aide, 3x/week but reports currently waiting for new one.  PATIENT GOALS: Pt would like to improve his ability to ambulate.  OBJECTIVE:  taken at [facility] unless otherwise noted:   DIAGNOSTIC FINDINGS: No recent imaging per chart  Objective measures taken at eval unless otherwise specified:   Posture: forward head posture, rounded shoulders  MMT LE: grossly  4+/5, exception hip flexors and abductors 4/5 each bilat   Functional tests:  5xSTS:  34 sec, use of BUE (multiple initial attempts with posterior LOB in chair)   : 0.096 m/s with RW, close CGA, and WC follow  Gait for distance (goal 150 ft): deferred due to time  Gait mechanics: impaired, decreased speed (see ), crouched posture, poor postural stability and relies heavily on BUE support on RW. Pt now with bilat AFOS   FOTO: 44 (goal 50)    TODAY'S TREATMENT  09/25/23    Wearing B LE custom AFOs. PT applied gait belt at start of session, CGA provided throughout session, unless otherwise stated for improved safety    TA:   Sit to stand from manual w/c (1 hand on walker and 1 on armrest- focusing on anterior trunk lean) - x 12 reps (initially requiring min assist to keep from falling backward once 3/4 standing but did improve with reps and on last 2 attempts- much improved anterior trunk lean and only CGA   Static stand with BUE support (focusing on erect standing) x 5  Static stand (attempted without UE Support) x 5 min  Gait training:  Gait in clinic using bariatric RW- 175 feet- Patient initially with poor gait symmetry- decreased right LE swing through for 1st 1/2 of walk- with constant VC - he did demonstrate some improvement but  required constant VC.  PATIENT EDUCATION: Education details: Pt educated throughout session about proper posture and technique with exercises. Improved exercise technique, movement at target joints, use of target muscles after min to mod verbal, visual, tactile cues.  Person educated: Patient Education method: Explanation, Demonstration, Tactile cues, and Verbal cues Education  comprehension: verbalized understanding, returned demonstration, and verbal cues required  HOME EXERCISE PROGRAM:  Standing hip flexor stretch at counter, prolonged standing with erect posture for gluteal activation, HS curls with RTB    Access Code: [email or web address] URL: [email or web address] Date: 05/15/2023 Prepared by: Carlen Chasten  Exercises - Seated Long Arc Quad with Ankle Weight  - 1 x daily - 7 x weekly - 2 sets - 10 reps   GOALS: Goals reviewed with patient? No  SHORT TERM GOALS: Target date: 11/12/2022  Pt will be independent with HEP in order to improve strength and balance in order to decrease fall risk and improve function at home. Baseline: instructed in HEP today, to provide printout next visit Goal status: MET    LONG TERM GOALS: Target date: 12/02/2023  Pt will increase FOTO to at least 50 in order to demonstrate improvement in function related to mobility and QoL. Baseline: 44 7/29:48 9/10:40 12/24/2022= 51 Goal status: MET  2.  Pt (< 60 yrs old) will complete 5xSTS test < 30 seconds indicating an increase in LE strength and improved balance. Baseline: 10/01/22 34 sec (multiple initial attempts with posterior LOB in chair) 12/20: 49 seconds 11/04/22:1:06, min A on multiple attempts to maintain balance and pt stays in flexed posture throughout test when coming to standing.   9/10: 27.5 sec, Pushes WC post consistently, close CGA  10/22: 47 seconds first attempt 12/5: 1 minute, 37 seconds, multiple attempts to get first stand, but no difficulty after that; 03/20/2023= 1 min 2 sec  with 1UE on walker and 1 on armrest- some posterior lean- Close CGA; 06/17/2023= 38 sec with 1 UE on walker and 1 on armrest; 08/21/2023= 35 sec with 1UE on walker and 1 on armrest.  Goal status: ONGOING  3.  Pt will increase to > 0.5 m/s (30 sec) as to improve gait speed for better home ambulation and reduce fall risk. Baseline: 10/01/22 0.096 m/s with RW, close CGA, and WC follow previously 12/27: 0.11 m/s 11/04/22: .056 m/s  9/10: 076 m/s 10/22: 0.64 m/s  11/26: .076 m/s; 03/20/2023= 0.08 m/s; 04/22/2023= 0.089 m/s; 05/15/2023 = 0.079 m/s; 05/27/2023= 0.11 m/s; 07/17/2023= 0.15 m/s; 08/21/2023= 0.14 m/s avg with Bariatric walker; 09/09/2023= 0.14 m/s  Goal status: PROGRESSING  4.  Pt will increase hip flexor and abductor  gross strength to 4+/5 bilat as to improve functional strength for independent gait, increased standing tolerance, and increased ADL ability. Baseline: 10/01/22 4+/5, exception hip flexors and abductors 4/5 each bilat  (previously 03/27/22: 4+/5 ) 9/10: Hip flexor 4/5, hip abductor 4+/5 10/22: hip flexor 4/5 hip abductor 4+/5 11/26: hip flexor 4+/5 hip abductor 4+/5 Goal status: MET  5.  Pt will ambulate 150 ft with BRW and WC follow with no rest breaks in less than 12 min to allow for improved mobility within home and increased independence. Baseline: 10/03/22 150' with BRW 04/03/22: 105 ft with BRW 7/29: 100 in 9:30 with pediatric RW 9/10:101 ft with BRW stooped posture worsened throughout test 12/24/2022- 150 feet using BRW, CGA with w/c follow- in 10 min 17 sec. Will keep goal to make sure he can do this consistently 10/22: 119 ft with BRW 8 min 23 seconds terminated by patient  11/26: 148 ft with BRW 14 min 34 seconds terminated by patient 03/20/2023= 152 feet using BRW, CGA and w/c follow in 14 min 3 sec- Need to keep goal active to ensure consistency but will revised and add a time element to goal. 04/22/2023= 162  feet in 11 min 54 sec using bariatric RW, CGA and close CGA  assist; 05/27/2023- 170 feet without rest break using bariatric RW. 06/17/2023= 160 feet with bariatric 4WW in 9 min- stopped due to fatigue.  Goal status: MET   6. Pt will decrease TUG to below 1 min 30 seconds/decrease in order to demonstrate decreased fall risk. Baseline: 06/17/2023= 2 min using bariatric 4WW; 09/09/2023= 1 min 50 sec with bariatric front wheeled walker.  Goal status: PROGRESSING  7. Pt will ambulate 300 ft with BRW, CGA and WC follow with no rest breaks in less than 20 min to allow for improved mobility within home/community and increased independence. Baseline: 06/17/2023= 160 feet with bariatric 4WW in 9 min- stopped due to fatigue. 07/17/2023- Patient able to walk 270 feet in > 20 min with BRW, CGA and close w/c follow today- VC for posture and for gait sequencing; 08/21/2023- Not specifically testing but patient has been walking 160 feet or more each Visit; 09/09/2023= Patient ambulated 180 feet overall at one time today using bariatric RW, CGA with VC for erect posture and for increased step length.  Goal Status: PROGRESSING    ASSESSMENT :  CLINICAL IMPRESSION:   Patient started off slow today- difficulty standing with more posterior leaning yet improved with max VC and practice. He then had some difficulty with advancing his RLE with walking but able to improve during session and able to walk better on 2nd half of long walk today. Patient will benefit from continued skilled physical therapy to further progress his strength and balance to decrease fall risk, improve QOL, and attain therapy goals.    OBJECTIVE IMPAIRMENTS Abnormal gait, decreased activity tolerance, decreased balance, decreased coordination, decreased endurance, decreased mobility, difficulty walking, decreased ROM, decreased strength, improper body mechanics, postural dysfunction, and obesity.   ACTIVITY LIMITATIONS carrying, lifting, bending, sitting, standing, squatting, stairs, transfers, bed mobility,  continence, bathing, toileting, dressing, hygiene/grooming, and locomotion level  PARTICIPATION LIMITATIONS: meal prep, cleaning, laundry, interpersonal relationship, driving, shopping, community activity, and yard work  PERSONAL FACTORS Fitness, Past/current experiences, Time since onset of injury/illness/exacerbation, and 3+ comorbidities: hx of brain tumor, past CVA, HTN, obesity, learning disability, DMII are also affecting patient's functional outcome.   REHAB POTENTIAL: Fair    CLINICAL DECISION MAKING: Stable/uncomplicated  EVALUATION COMPLEXITY: Low  PLAN: PT FREQUENCY: 1-2x/week  PT DURATION: 12 weeks  PLANNED INTERVENTIONS: Therapeutic exercises, Therapeutic activity, Neuromuscular re-education, Balance training, Gait training, Patient/Family education, Joint mobilization, Stair training, Vestibular training, Orthotic/Fit training, DME instructions, Dry Needling, Electrical stimulation, Wheelchair mobility training, Cryotherapy, Moist heat, Compression bandaging, Manual therapy, and Re-evaluation  PLAN FOR NEXT SESSION:    -Continue with functional strengthening exercises of the LE's  -Gait activities using RW. Incorporate proper technique with all transfers to decrease his risk of falling.     Murlene Army, PT Physical Therapist - [place]  [facility]  3:10 PM 09/25/23  [facility] [address] [place], [place], [address] Phone: [phone]   Fax:  [phone]  Patient Details  Name: Curtis Powell MRN: [id number] Date of Birth: July 13, 1965 Referring Provider:  Rex Castor, MD  Encounter Date: 09/25/2023   Murlene Army, PT 09/25/2023, 3:10 PM  [facility] at [employer] [address] [place], [place], [address] Phone: [phone]   Fax:  [phone]

## 2023-09-30 ENCOUNTER — Ambulatory Visit: Payer: 59

## 2023-09-30 DIAGNOSIS — M6281 Muscle weakness (generalized): Secondary | ICD-10-CM

## 2023-09-30 DIAGNOSIS — R262 Difficulty in walking, not elsewhere classified: Secondary | ICD-10-CM | POA: Diagnosis not present

## 2023-09-30 DIAGNOSIS — R278 Other lack of coordination: Secondary | ICD-10-CM

## 2023-09-30 DIAGNOSIS — R2681 Unsteadiness on feet: Secondary | ICD-10-CM

## 2023-09-30 DIAGNOSIS — R269 Unspecified abnormalities of gait and mobility: Secondary | ICD-10-CM

## 2023-09-30 DIAGNOSIS — R2689 Other abnormalities of gait and mobility: Secondary | ICD-10-CM

## 2023-09-30 DIAGNOSIS — R296 Repeated falls: Secondary | ICD-10-CM

## 2023-09-30 NOTE — Therapy (Signed)
 OUTPATIENT PHYSICAL THERAPY NEURO TREATMENT  Patient Name: GEMINI BEAUMIER MRN: [id number] DOB:12-16-1965, 58 y.o., male Today's Date: 09/30/2023  PCP: Sherial Bail, MD  REFERRING PROVIDER: Sherial Bail, MD    PT End of Session - 09/30/23 1319     Visit Number 89    Number of Visits 109    Date for PT Re-Evaluation 12/02/23    Progress Note Due on Visit 90    PT Start Time 1314    PT Stop Time 1354    PT Time Calculation (min) 40 min    Equipment Utilized During Treatment Gait belt    Activity Tolerance Patient tolerated treatment well    Behavior During Therapy WFL for tasks assessed/performed               Past Medical History:  Diagnosis Date   Asthma    GERD (gastroesophageal reflux disease)    Hyperlipidemia    Hypertension    Obesity    Stroke [facility])    Past Surgical History:  Procedure Laterality Date   BRAIN SURGERY     Patient Active Problem List   Diagnosis Date Noted   Impaired glucose tolerance 03/23/2020   Learning disability 03/23/2020   Urinary and fecal incontinence 06/14/2019   Chronic pain syndrome 03/23/2019   Constipation 03/23/2019   Dysarthria 03/23/2019   Dysphagia 03/23/2019   Learning difficulty 03/23/2019   Morbid obesity (HCC) 03/23/2019   Muscle weakness 03/23/2019   Vitamin D deficiency 03/23/2019   Bilateral carotid artery stenosis 12/15/2018   Moderate aortic valve stenosis 12/15/2018   BMI 45.0-49.9, adult (HCC) 11/10/2018   Cerebrovascular accident (HCC) 05/02/2016   Hemiparesis affecting right side as late effect of cerebrovascular accident (HCC) 05/01/2016   Brain tumor (HCC) 11/24/2014   Gastro-esophageal reflux disease without esophagitis 11/24/2014   Accident due to mechanical fall without injury 08/03/2014   Essential hypertension 06/13/2014   Mixed hyperlipidemia 06/13/2014   Unspecified sequelae of cerebral infarction 06/13/2014   Thyromegaly 01/07/2014   Type 2 diabetes mellitus without  complication (HCC) 01/06/2014   Neck mass 09/08/2013   Swimmer's ear 09/08/2013   Erectile dysfunction 01/03/2013   Prediabetes 01/03/2013   ONSET DATE: 05/09/2016  REFERRING DIAG: P30.648 (ICD-10-CM) - Hemiplegia and hemiparesis following cerebral infarction affecting right dominant side   THERAPY DIAG:  Difficulty in walking, not elsewhere classified  Unsteadiness on feet  Muscle weakness (generalized)  Other abnormalities of gait and mobility  Abnormality of gait and mobility  Other lack of coordination  Repeated falls   Rationale for Evaluation and Treatment Rehabilitation  SUBJECTIVE:  SUBJECTIVE STATEMENT    Patient reports no changes since last visit. Reports feeling tired.   Pt accompanied by: self   PERTINENT HISTORY:   The pt is a pleasant 58 yo male who returns to PT for evaluation of deficits s/o old CVA. Pt with hx of CVA Feb 2018. He was previously fully independent, but CVA caused significant deficits, mainly affecting his right side. Pt reports continued difficulty with walking and transfers. Pt still using WC for majority of mobility, otherwise he uses his RW. Pt lives with his mom who is his main caregiver. Patient had outpatient PT previously in clinic in 2023. He reports since discharge he has been completing his HEP and walking about every day. Pt would like to improve his walking mainly, but also would like to work on balance. He also reports difficulty with transfers. Pt's mom and his caregiver helps him with ADLs. However, he says he is now waiting for a new caregiver. Prior to this pt had caregiver 3x/week. PMH significant for hx of brain tumor, past CVA, DMII and HTN. See chart for additional information.  PAIN:  Are you having pain? Pt reports no pain  currently  PRECAUTIONS: fall  WEIGHT BEARING RESTRICTIONS none  PLOF: required assistance with ADLs (old CVA), prior to CVA in 2018 was indep.  History of Falls: Pt reports no recent falls  Home information: Pt lives with mother who is his primary caregiver. Previously had home aide, 3x/week but reports currently waiting for new one.  PATIENT GOALS: Pt would like to improve his ability to ambulate.  OBJECTIVE:  taken at [facility] unless otherwise noted:   DIAGNOSTIC FINDINGS: No recent imaging per chart  Objective measures taken at eval unless otherwise specified:   Posture: forward head posture, rounded shoulders  MMT LE: grossly  4+/5, exception hip flexors and abductors 4/5 each bilat   Functional tests:  5xSTS:  34 sec, use of BUE (multiple initial attempts with posterior LOB in chair)   : 0.096 m/s with RW, close CGA, and WC follow  Gait for distance (goal 150 ft): deferred due to time  Gait mechanics: impaired, decreased speed (see ), crouched posture, poor postural stability and relies heavily on BUE support on RW. Pt now with bilat AFOS   FOTO: 44 (goal 50)    TODAY'S TREATMENT  09/30/23    Wearing B LE custom AFOs. PT applied gait belt at start of session, CGA provided throughout session, unless otherwise stated for improved safety    TA:   Static stand with BUE support (focusing on erect standing) x 5 min  Static stand (attempted without UE Support) x 3 min (VC to stand erect) then another 2 min.   Walking in // bars with BUE Support - down and back + stepping over 1/2 foam roll to simulate threshold of front door x 4 (total of 8 step over threshold)  *Patient was exhausted on last trial- unable to stand erect and slumped over requesting chair-     Sit to stand from manual w/c (1 hand on walker and 1 on armrest- focusing on anterior trunk lean) - x 8 reps - Initially Min assist but only CGA on last 2      Gait training:  Gait in clinic using  bariatric RW- 175 feet- Patient initially with poor gait symmetry- decreased right LE swing through for 1st 1/2 of walk- with constant VC - he did demonstrate some improvement but required constant VC.  PATIENT EDUCATION: Education details:  Pt educated throughout session about proper posture and technique with exercises. Improved exercise technique, movement at target joints, use of target muscles after min to mod verbal, visual, tactile cues.  Person educated: Patient Education method: Explanation, Demonstration, Tactile cues, and Verbal cues Education comprehension: verbalized understanding, returned demonstration, and verbal cues required  HOME EXERCISE PROGRAM:  Standing hip flexor stretch at counter, prolonged standing with erect posture for gluteal activation, HS curls with RTB    Access Code: [email or web address] URL: [email or web address] Date: 05/15/2023 Prepared by: Connell Kiss  Exercises - Seated Long Arc Quad with Ankle Weight  - 1 x daily - 7 x weekly - 2 sets - 10 reps   GOALS: Goals reviewed with patient? No  SHORT TERM GOALS: Target date: 11/12/2022  Pt will be independent with HEP in order to improve strength and balance in order to decrease fall risk and improve function at home. Baseline: instructed in HEP today, to provide printout next visit Goal status: MET    LONG TERM GOALS: Target date: 12/02/2023  Pt will increase FOTO to at least 50 in order to demonstrate improvement in function related to mobility and QoL. Baseline: 44 7/29:48 9/10:40 12/24/2022= 51 Goal status: MET  2.  Pt (< 60 yrs old) will complete 5xSTS test < 30 seconds indicating an increase in LE strength and improved balance. Baseline: 10/01/22 34 sec (multiple initial attempts with posterior LOB in chair) 12/20: 49 seconds 11/04/22:1:06, min A on multiple attempts to maintain balance and pt stays in flexed posture throughout test when coming to standing.   9/10: 27.5 sec, Pushes WC post  consistently, close CGA  10/22: 47 seconds first attempt 12/5: 1 minute, 37 seconds, multiple attempts to get first stand, but no difficulty after that; 03/20/2023= 1 min 2 sec with 1UE on walker and 1 on armrest- some posterior lean- Close CGA; 06/17/2023= 38 sec with 1 UE on walker and 1 on armrest; 08/21/2023= 35 sec with 1UE on walker and 1 on armrest.  Goal status: ONGOING  3.  Pt will increase to > 0.5 m/s (30 sec) as to improve gait speed for better home ambulation and reduce fall risk. Baseline: 10/01/22 0.096 m/s with RW, close CGA, and WC follow previously 12/27: 0.11 m/s 11/04/22: .056 m/s  9/10: 076 m/s 10/22: 0.64 m/s  11/26: .076 m/s; 03/20/2023= 0.08 m/s; 04/22/2023= 0.089 m/s; 05/15/2023 = 0.079 m/s; 05/27/2023= 0.11 m/s; 07/17/2023= 0.15 m/s; 08/21/2023= 0.14 m/s avg with Bariatric walker; 09/09/2023= 0.14 m/s  Goal status: PROGRESSING  4.  Pt will increase hip flexor and abductor  gross strength to 4+/5 bilat as to improve functional strength for independent gait, increased standing tolerance, and increased ADL ability. Baseline: 10/01/22 4+/5, exception hip flexors and abductors 4/5 each bilat  (previously 03/27/22: 4+/5 ) 9/10: Hip flexor 4/5, hip abductor 4+/5 10/22: hip flexor 4/5 hip abductor 4+/5 11/26: hip flexor 4+/5 hip abductor 4+/5 Goal status: MET  5.  Pt will ambulate 150 ft with BRW and WC follow with no rest breaks in less than 12 min to allow for improved mobility within home and increased independence. Baseline: 10/03/22 150' with BRW 04/03/22: 105 ft with BRW 7/29: 100 in 9:30 with pediatric RW 9/10:101 ft with BRW stooped posture worsened throughout test 12/24/2022- 150 feet using BRW, CGA with w/c follow- in 10 min 17 sec. Will keep goal to make sure he can do this consistently 10/22: 119 ft with BRW 8 min 23 seconds terminated by patient  11/26: 148  ft with BRW 14 min 34 seconds terminated by patient 03/20/2023= 152 feet using BRW, CGA and w/c follow in 14 min 3  sec- Need to keep goal active to ensure consistency but will revised and add a time element to goal. 04/22/2023= 162 feet in 11 min 54 sec using bariatric RW, CGA and close CGA assist; 05/27/2023- 170 feet without rest break using bariatric RW. 06/17/2023= 160 feet with bariatric 4WW in 9 min- stopped due to fatigue.  Goal status: MET   6. Pt will decrease TUG to below 1 min 30 seconds/decrease in order to demonstrate decreased fall risk. Baseline: 06/17/2023= 2 min using bariatric 4WW; 09/09/2023= 1 min 50 sec with bariatric front wheeled walker.  Goal status: PROGRESSING  7. Pt will ambulate 300 ft with BRW, CGA and WC follow with no rest breaks in less than 20 min to allow for improved mobility within home/community and increased independence. Baseline: 06/17/2023= 160 feet with bariatric 4WW in 9 min- stopped due to fatigue. 07/17/2023- Patient able to walk 270 feet in > 20 min with BRW, CGA and close w/c follow today- VC for posture and for gait sequencing; 08/21/2023- Not specifically testing but patient has been walking 160 feet or more each Visit; 09/09/2023= Patient ambulated 180 feet overall at one time today using bariatric RW, CGA with VC for erect posture and for increased step length.  Goal Status: PROGRESSING    ASSESSMENT :  CLINICAL IMPRESSION:   Treatment focused on functional mobility addressing patient goal of walking over threshold at front entrance. He was successful in stepping over threshold with close CGA and patient happy about ability to pick up his legs and not lose his balance. He still needs work with static standing balance- inconsistent with ability to maintain erect posture and neutral weight shifting- tendency to lean back posteriorly with increased risk of falling backward. Patient will benefit from continued skilled physical therapy to further progress his strength and balance to decrease fall risk, improve QOL, and attain therapy goals.    OBJECTIVE IMPAIRMENTS Abnormal  gait, decreased activity tolerance, decreased balance, decreased coordination, decreased endurance, decreased mobility, difficulty walking, decreased ROM, decreased strength, improper body mechanics, postural dysfunction, and obesity.   ACTIVITY LIMITATIONS carrying, lifting, bending, sitting, standing, squatting, stairs, transfers, bed mobility, continence, bathing, toileting, dressing, hygiene/grooming, and locomotion level  PARTICIPATION LIMITATIONS: meal prep, cleaning, laundry, interpersonal relationship, driving, shopping, community activity, and yard work  PERSONAL FACTORS Fitness, Past/current experiences, Time since onset of injury/illness/exacerbation, and 3+ comorbidities: hx of brain tumor, past CVA, HTN, obesity, learning disability, DMII are also affecting patient's functional outcome.   REHAB POTENTIAL: Fair    CLINICAL DECISION MAKING: Stable/uncomplicated  EVALUATION COMPLEXITY: Low  PLAN: PT FREQUENCY: 1-2x/week  PT DURATION: 12 weeks  PLANNED INTERVENTIONS: Therapeutic exercises, Therapeutic activity, Neuromuscular re-education, Balance training, Gait training, Patient/Family education, Joint mobilization, Stair training, Vestibular training, Orthotic/Fit training, DME instructions, Dry Needling, Electrical stimulation, Wheelchair mobility training, Cryotherapy, Moist heat, Compression bandaging, Manual therapy, and Re-evaluation  PLAN FOR NEXT SESSION:    -Continue with functional strengthening exercises of the LE's  -Gait activities using RW. Incorporate proper technique with all transfers to decrease his risk of falling.     Reyes LOISE London, PT Physical Therapist - [facility]  1:59 PM 09/30/23  [facility] at [facility] [address] [place], [place], [address] Phone: [phone]   Fax:  [phone]

## 2023-10-02 ENCOUNTER — Ambulatory Visit: Payer: 59

## 2023-10-02 DIAGNOSIS — R262 Difficulty in walking, not elsewhere classified: Secondary | ICD-10-CM

## 2023-10-02 DIAGNOSIS — R2681 Unsteadiness on feet: Secondary | ICD-10-CM

## 2023-10-02 DIAGNOSIS — M6281 Muscle weakness (generalized): Secondary | ICD-10-CM

## 2023-10-02 DIAGNOSIS — R2689 Other abnormalities of gait and mobility: Secondary | ICD-10-CM

## 2023-10-02 DIAGNOSIS — R296 Repeated falls: Secondary | ICD-10-CM

## 2023-10-02 DIAGNOSIS — R278 Other lack of coordination: Secondary | ICD-10-CM

## 2023-10-02 DIAGNOSIS — R269 Unspecified abnormalities of gait and mobility: Secondary | ICD-10-CM

## 2023-10-02 NOTE — Therapy (Signed)
 OUTPATIENT PHYSICAL THERAPY NEURO TREATMENT/Physical Therapy Progress Note   Dates of reporting period  08/21/2023   to   10/02/2023    Patient Name: Curtis Powell MRN: [id number] DOB:[birth year], 58 y.o., male Today's Date: 10/02/2023  PCP: Sherial Bail, MD  REFERRING PROVIDER: Sherial Bail, MD    PT End of Session - 10/02/23 1313     Visit Number 90    Number of Visits 109    Date for PT Re-Evaluation 12/02/23    Progress Note Due on Visit 90    PT Start Time 1314    PT Stop Time 1354    PT Time Calculation (min) 40 min    Equipment Utilized During Treatment Gait belt    Activity Tolerance Patient tolerated treatment well    Behavior During Therapy WFL for tasks assessed/performed               Past Medical History:  Diagnosis Date   Asthma    GERD (gastroesophageal reflux disease)    Hyperlipidemia    Hypertension    Obesity    Stroke [facility])    Past Surgical History:  Procedure Laterality Date   BRAIN SURGERY     Patient Active Problem List   Diagnosis Date Noted   Impaired glucose tolerance 03/23/2020   Learning disability 03/23/2020   Urinary and fecal incontinence 06/14/2019   Chronic pain syndrome 03/23/2019   Constipation 03/23/2019   Dysarthria 03/23/2019   Dysphagia 03/23/2019   Learning difficulty 03/23/2019   Morbid obesity (HCC) 03/23/2019   Muscle weakness 03/23/2019   Vitamin D deficiency 03/23/2019   Bilateral carotid artery stenosis 12/15/2018   Moderate aortic valve stenosis 12/15/2018   BMI 45.0-49.9, adult (HCC) 11/10/2018   Cerebrovascular accident (HCC) 05/02/2016   Hemiparesis affecting right side as late effect of cerebrovascular accident (HCC) 05/01/2016   Brain tumor (HCC) 11/24/2014   Gastro-esophageal reflux disease without esophagitis 11/24/2014   Accident due to mechanical fall without injury 08/03/2014   Essential hypertension 06/13/2014   Mixed hyperlipidemia 06/13/2014   Unspecified sequelae of cerebral  infarction 06/13/2014   Thyromegaly 01/07/2014   Type 2 diabetes mellitus without complication (HCC) 01/06/2014   Neck mass 09/08/2013   Swimmer's ear 09/08/2013   Erectile dysfunction 01/03/2013   Prediabetes 01/03/2013   ONSET DATE: 05/09/2016  REFERRING DIAG: P30.648 (ICD-10-CM) - Hemiplegia and hemiparesis following cerebral infarction affecting right dominant side   THERAPY DIAG:  Difficulty in walking, not elsewhere classified  Unsteadiness on feet  Muscle weakness (generalized)  Other abnormalities of gait and mobility  Abnormality of gait and mobility  Other lack of coordination  Repeated falls   Rationale for Evaluation and Treatment Rehabilitation  SUBJECTIVE:  SUBJECTIVE STATEMENT    Patient reports doing okay. Still tired but no other concerns- denies any pain or falls since last visit. States compliant with walking and seated activities at home.  Pt accompanied by: self   PERTINENT HISTORY:   The pt is a pleasant 58 yo male who returns to PT for evaluation of deficits s/o old CVA. Pt with hx of CVA Feb 2018. He was previously fully independent, but CVA caused significant deficits, mainly affecting his right side. Pt reports continued difficulty with walking and transfers. Pt still using WC for majority of mobility, otherwise he uses his RW. Pt lives with his mom who is his main caregiver. Patient had outpatient PT previously in clinic in 2023. He reports since discharge he has been completing his HEP and walking about every day. Pt would like to improve his walking mainly, but also would like to work on balance. He also reports difficulty with transfers. Pt's mom and his caregiver helps him with ADLs. However, he says he is now waiting for a new caregiver. Prior to this pt had  caregiver 3x/week. PMH significant for hx of brain tumor, past CVA, DMII and HTN. See chart for additional information.  PAIN:  Are you having pain? Pt reports no pain currently  PRECAUTIONS: fall  WEIGHT BEARING RESTRICTIONS none  PLOF: required assistance with ADLs (old CVA), prior to CVA in 2018 was indep.  History of Falls: Pt reports no recent falls  Home information: Pt lives with mother who is his primary caregiver. Previously had home aide, 3x/week but reports currently waiting for new one.  PATIENT GOALS: Pt would like to improve his ability to ambulate.  OBJECTIVE:  taken at [facility] unless otherwise noted:   DIAGNOSTIC FINDINGS: No recent imaging per chart  Objective measures taken at eval unless otherwise specified:   Posture: forward head posture, rounded shoulders  MMT LE: grossly  4+/5, exception hip flexors and abductors 4/5 each bilat   Functional tests:  5xSTS:  34 sec, use of BUE (multiple initial attempts with posterior LOB in chair)   : 0.096 m/s with RW, close CGA, and WC follow  Gait for distance (goal 150 ft): deferred due to time  Gait mechanics: impaired, decreased speed (see ), crouched posture, poor postural stability and relies heavily on BUE support on RW. Pt now with bilat AFOS   FOTO: 44 (goal 50)    TODAY'S TREATMENT  10/02/23    Wearing B LE custom AFOs. PT applied gait belt at start of session, CGA provided throughout session, unless otherwise stated for improved safety    TA:   Static stand with BUE support (focusing on erect standing) x 2 min  Physical therapy treatment session today consisted of completing assessment of goals and administration of testing as demonstrated and documented in flow sheet, treatment, and goals section of this note. Addition treatments may be found below.   PATIENT EDUCATION: Education details: Pt educated throughout session about proper posture and technique with exercises. Improved exercise  technique, movement at target joints, use of target muscles after min to mod verbal, visual, tactile cues.  Person educated: Patient Education method: Explanation, Demonstration, Tactile cues, and Verbal cues Education comprehension: verbalized understanding, returned demonstration, and verbal cues required  HOME EXERCISE PROGRAM:  Standing hip flexor stretch at counter, prolonged standing with erect posture for gluteal activation, HS curls with RTB    Access Code: [email or web address] URL: [email or web address] Date: 05/15/2023 Prepared by: Connell Kiss  Exercises - Seated Long Arc  Quad with Ankle Weight  - 1 x daily - 7 x weekly - 2 sets - 10 reps   GOALS: Goals reviewed with patient? No  SHORT TERM GOALS: Target date: 11/12/2022  Pt will be independent with HEP in order to improve strength and balance in order to decrease fall risk and improve function at home. Baseline: instructed in HEP today, to provide printout next visit Goal status: MET    LONG TERM GOALS: Target date: 12/02/2023  Pt will increase FOTO to at least 50 in order to demonstrate improvement in function related to mobility and QoL. Baseline: 44 7/29:48 9/10:40 12/24/2022= 51 Goal status: MET  2.  Pt (< 60 yrs old) will complete 5xSTS test < 30 seconds indicating an increase in LE strength and improved balance. Baseline: 10/01/22 34 sec (multiple initial attempts with posterior LOB in chair) 12/20: 49 seconds 11/04/22:1:06, min A on multiple attempts to maintain balance and pt stays in flexed posture throughout test when coming to standing.   9/10: 27.5 sec, Pushes WC post consistently, close CGA  10/22: 47 seconds first attempt 12/5: 1 minute, 37 seconds, multiple attempts to get first stand, but no difficulty after that; 03/20/2023= 1 min 2 sec with 1UE on walker and 1 on armrest- some posterior lean- Close CGA; 06/17/2023= 38 sec with 1 UE on walker and 1 on armrest; 08/21/2023= 35 sec with 1UE on walker  and 1 on armrest.  Goal status: ONGOING  3.  Pt will increase to > 0.5 m/s (30 sec) as to improve gait speed for better home ambulation and reduce fall risk. Baseline: 10/01/22 0.096 m/s with RW, close CGA, and WC follow previously 12/27: 0.11 m/s 11/04/22: .056 m/s  9/10: 076 m/s 10/22: 0.64 m/s  11/26: .076 m/s; 03/20/2023= 0.08 m/s; 04/22/2023= 0.089 m/s; 05/15/2023 = 0.079 m/s; 05/27/2023= 0.11 m/s; 07/17/2023= 0.15 m/s; 08/21/2023= 0.14 m/s avg with Bariatric walker; 09/09/2023= 0.14 m/s; 10/02/2023= 0.09 m/s with Bariatric walker and Increased cues to swing RLE through vs. Previous sesisons.  Goal status: ONGOING  4.  Pt will increase hip flexor and abductor  gross strength to 4+/5 bilat as to improve functional strength for independent gait, increased standing tolerance, and increased ADL ability. Baseline: 10/01/22 4+/5, exception hip flexors and abductors 4/5 each bilat  (previously 03/27/22: 4+/5 ) 9/10: Hip flexor 4/5, hip abductor 4+/5 10/22: hip flexor 4/5 hip abductor 4+/5 11/26: hip flexor 4+/5 hip abductor 4+/5 Goal status: MET  5.  Pt will ambulate 150 ft with BRW and WC follow with no rest breaks in less than 12 min to allow for improved mobility within home and increased independence. Baseline: 10/03/22 150' with BRW 04/03/22: 105 ft with BRW 7/29: 100 in 9:30 with pediatric RW 9/10:101 ft with BRW stooped posture worsened throughout test 12/24/2022- 150 feet using BRW, CGA with w/c follow- in 10 min 17 sec. Will keep goal to make sure he can do this consistently 10/22: 119 ft with BRW 8 min 23 seconds terminated by patient  11/26: 148 ft with BRW 14 min 34 seconds terminated by patient 03/20/2023= 152 feet using BRW, CGA and w/c follow in 14 min 3 sec- Need to keep goal active to ensure consistency but will revised and add a time element to goal. 04/22/2023= 162 feet in 11 min 54 sec using bariatric RW, CGA and close CGA assist; 05/27/2023- 170 feet without rest break using bariatric  RW. 06/17/2023= 160 feet with bariatric 4WW in 9 min- stopped due to  fatigue.  Goal status: MET   6. Pt will decrease TUG to below 1 min 30 seconds/decrease in order to demonstrate decreased fall risk. Baseline: 06/17/2023= 2 min using bariatric 4WW; 09/09/2023= 1 min 50 sec with bariatric front wheeled walker.  Goal status: PROGRESSING  7. Pt will ambulate 300 ft with BRW, CGA and WC follow with no rest breaks in less than 20 min to allow for improved mobility within home/community and increased independence. Baseline: 06/17/2023= 160 feet with bariatric 4WW in 9 min- stopped due to fatigue. 07/17/2023- Patient able to walk 270 feet in > 20 min with BRW, CGA and close w/c follow today- VC for posture and for gait sequencing; 08/21/2023- Not specifically testing but patient has been walking 160 feet or more each Visit; 09/09/2023= Patient ambulated 180 feet overall at one time today using bariatric RW, CGA with VC for erect posture and for increased step length. 10/02/2023= 165 feet with Bariatric RW- close w/c follow and CGA. More diffiuclty with gait sequencing more consistent VC for erect posture.  Goal Status: PROGRESSING    ASSESSMENT :  CLINICAL IMPRESSION:   Patient present for progress note visit and presented with good motivation for testing. He denied any pain but demonstrated increased struggle with walking speed and endurance today exhibiting more forward flexed posture despite VC and step to gait despite constant VC. He was ultimately fatigued after walking so terminated session today. Today's values are not reflective of his most recent progress and he will benefit from retesting in near future. Patient's condition has the potential to improve in response to therapy. Maximum improvement is yet to be obtained. The anticipated improvement is attainable and reasonable in a generally predictable time.  Patient will benefit from continued skilled physical therapy to further progress his strength and  balance to decrease fall risk, improve QOL, and attain therapy goals.    OBJECTIVE IMPAIRMENTS Abnormal gait, decreased activity tolerance, decreased balance, decreased coordination, decreased endurance, decreased mobility, difficulty walking, decreased ROM, decreased strength, improper body mechanics, postural dysfunction, and obesity.   ACTIVITY LIMITATIONS carrying, lifting, bending, sitting, standing, squatting, stairs, transfers, bed mobility, continence, bathing, toileting, dressing, hygiene/grooming, and locomotion level  PARTICIPATION LIMITATIONS: meal prep, cleaning, laundry, interpersonal relationship, driving, shopping, community activity, and yard work  PERSONAL FACTORS Fitness, Past/current experiences, Time since onset of injury/illness/exacerbation, and 3+ comorbidities: hx of brain tumor, past CVA, HTN, obesity, learning disability, DMII are also affecting patient's functional outcome.   REHAB POTENTIAL: Fair    CLINICAL DECISION MAKING: Stable/uncomplicated  EVALUATION COMPLEXITY: Low  PLAN: PT FREQUENCY: 1-2x/week  PT DURATION: 12 weeks  PLANNED INTERVENTIONS: Therapeutic exercises, Therapeutic activity, Neuromuscular re-education, Balance training, Gait training, Patient/Family education, Joint mobilization, Stair training, Vestibular training, Orthotic/Fit training, DME instructions, Dry Needling, Electrical stimulation, Wheelchair mobility training, Cryotherapy, Moist heat, Compression bandaging, Manual therapy, and Re-evaluation  PLAN FOR NEXT SESSION:    -Continue with functional strengthening exercises of the LE's  -Gait activities using RW. Incorporate proper technique with all transfers to decrease his risk of falling.     Reyes LOISE London, PT Physical Therapist - [place]  [facility]  5:11 PM 10/02/23  [facility] [address] [place], [place], [address] Phone:  [phone]   Fax:  [phone]

## 2023-10-07 ENCOUNTER — Ambulatory Visit: Payer: 59 | Attending: Internal Medicine

## 2023-10-07 DIAGNOSIS — R296 Repeated falls: Secondary | ICD-10-CM | POA: Insufficient documentation

## 2023-10-07 DIAGNOSIS — R531 Weakness: Secondary | ICD-10-CM | POA: Insufficient documentation

## 2023-10-07 DIAGNOSIS — M6281 Muscle weakness (generalized): Secondary | ICD-10-CM | POA: Insufficient documentation

## 2023-10-07 DIAGNOSIS — R2689 Other abnormalities of gait and mobility: Secondary | ICD-10-CM | POA: Insufficient documentation

## 2023-10-07 DIAGNOSIS — R269 Unspecified abnormalities of gait and mobility: Secondary | ICD-10-CM | POA: Insufficient documentation

## 2023-10-07 DIAGNOSIS — I69351 Hemiplegia and hemiparesis following cerebral infarction affecting right dominant side: Secondary | ICD-10-CM | POA: Insufficient documentation

## 2023-10-07 DIAGNOSIS — R278 Other lack of coordination: Secondary | ICD-10-CM | POA: Diagnosis present

## 2023-10-07 DIAGNOSIS — R262 Difficulty in walking, not elsewhere classified: Secondary | ICD-10-CM | POA: Insufficient documentation

## 2023-10-07 DIAGNOSIS — R2681 Unsteadiness on feet: Secondary | ICD-10-CM | POA: Insufficient documentation

## 2023-10-07 NOTE — Therapy (Signed)
 OUTPATIENT PHYSICAL THERAPY TREATMENT Patient Name: Curtis Powell MRN: [id number] DOB:03/28/1966, 58 y.o., male Today's Date: 10/07/2023  PCP: Sherial Bail, MD  REFERRING PROVIDER: Sherial Bail, MD    PT End of Session - 10/07/23 1326     Visit Number 91    Number of Visits 109    Date for PT Re-Evaluation 12/02/23    Progress Note Due on Visit 100    PT Start Time 1315    PT Stop Time 1355    PT Time Calculation (min) 40 min    Equipment Utilized During Treatment Gait belt    Activity Tolerance Patient tolerated treatment well;No increased pain    Behavior During Therapy WFL for tasks assessed/performed           Past Medical History:  Diagnosis Date   Asthma    GERD (gastroesophageal reflux disease)    Hyperlipidemia    Hypertension    Obesity    Stroke [facility])    Past Surgical History:  Procedure Laterality Date   BRAIN SURGERY     Patient Active Problem List   Diagnosis Date Noted   Impaired glucose tolerance 03/23/2020   Learning disability 03/23/2020   Urinary and fecal incontinence 06/14/2019   Chronic pain syndrome 03/23/2019   Constipation 03/23/2019   Dysarthria 03/23/2019   Dysphagia 03/23/2019   Learning difficulty 03/23/2019   Morbid obesity (HCC) 03/23/2019   Muscle weakness 03/23/2019   Vitamin D deficiency 03/23/2019   Bilateral carotid artery stenosis 12/15/2018   Moderate aortic valve stenosis 12/15/2018   BMI 45.0-49.9, adult (HCC) 11/10/2018   Cerebrovascular accident (HCC) 05/02/2016   Hemiparesis affecting right side as late effect of cerebrovascular accident (HCC) 05/01/2016   Brain tumor (HCC) 11/24/2014   Gastro-esophageal reflux disease without esophagitis 11/24/2014   Accident due to mechanical fall without injury 08/03/2014   Essential hypertension 06/13/2014   Mixed hyperlipidemia 06/13/2014   Unspecified sequelae of cerebral infarction 06/13/2014   Thyromegaly 01/07/2014   Type 2 diabetes mellitus without  complication (HCC) 01/06/2014   Neck mass 09/08/2013   Swimmer's ear 09/08/2013   Erectile dysfunction 01/03/2013   Prediabetes 01/03/2013   ONSET DATE: 05/09/2016  REFERRING DIAG: P30.648 (ICD-10-CM) - Hemiplegia and hemiparesis following cerebral infarction affecting right dominant side   THERAPY DIAG:  Difficulty in walking, not elsewhere classified  Unsteadiness on feet  Muscle weakness (generalized)  Other abnormalities of gait and mobility   Rationale for Evaluation and Treatment Rehabilitation  SUBJECTIVE:  SUBJECTIVE STATEMENT No updates medically. No pain today. Pt is excited about Fourth of July party at his Mom's place.   Pt accompanied by: self   PERTINENT HISTORY:  The pt is a pleasant 58 yo male who returns to PT for evaluation of deficits s/o old CVA. Pt with hx of CVA Feb 2018. He was previously fully independent, but CVA caused significant deficits, mainly affecting his right side. Pt reports continued difficulty with walking and transfers. Pt still using WC for majority of mobility, otherwise he uses his RW. Pt lives with his mom who is his main caregiver. Patient had outpatient PT previously in clinic in 2023. He reports since discharge he has been completing his HEP and walking about every day. Pt would like to improve his walking mainly, but also would like to work on balance. He also reports difficulty with transfers. Pt's mom and his caregiver helps him with ADLs. However, he says he is now waiting for a new caregiver. Prior to this pt had caregiver 3x/week. PMH significant for hx of brain tumor, past CVA, DMII and HTN. See chart for additional information.  PAIN:  Are you having pain? Pt reports no pain currently  PRECAUTIONS: fall  WEIGHT BEARING RESTRICTIONS none  PLOF:  required assistance with ADLs (old CVA), prior to CVA in 2018 was indep.  History of Falls: Pt reports no recent falls  Home information: Pt lives with mother who is his primary caregiver. Previously had home aide, 3x/week but reports currently waiting for new one.  PATIENT GOALS: Pt would like to improve his ability to ambulate.  OBJECTIVE:  taken at [facility] unless otherwise noted:   DIAGNOSTIC FINDINGS: No recent imaging per chart  Objective measures taken at eval unless otherwise specified:   Posture: forward head posture, rounded shoulders  MMT LE: grossly  4+/5, exception hip flexors and abductors 4/5 each bilat   Functional tests:  5xSTS:  34 sec, use of BUE (multiple initial attempts with posterior LOB in chair)   : 0.096 m/s with RW, close CGA, and WC follow  Gait for distance (goal 150 ft): deferred due to time  Gait mechanics: impaired, decreased speed (see ), crouched posture, poor postural stability and relies heavily on BUE support on RW. Pt now with bilat AFOS   FOTO: 44 (goal 50)   TODAY'S TREATMENT  10/07/23    Wearing B LE custom AFOs. PT applied gait belt at start of session, CGA provided throughout session, unless otherwise stated for improved safety   -STS from [facility] to BRW x2, modA  -Seated marching x30, alterating -seated wand fleixon over thoracic towel roll in WC x15 (AA/ROM)  -STS from WC + airex pad to BRW x8 minA  -Seated marching x30, alterating -seated wand fleixon over thoracic towel roll in WC x15 (AA/ROM)  -STS from WC + airex pad to single // bar x10 -Seated marching x30, alterating -seated wand fleixon over thoracic towel roll in WC x15 (AA/ROM)  -STS from WC + airex pad to single // bar x10 -STS from [facility] // bar x10 -STS from [facility] // bar x1 + sustained standing c 1 hand support, while flipping 52 card over and naming out load (8 minutes total, then cut off)  -AMB 75ft c BRW, minGuard assist.    PATIENT EDUCATION: Education  details: If pt can lean pelvis into support surface, his energy efficicency will be higher.  Person educated: Patient Education method: Explanation, Demonstration, Tactile cues, and Verbal cues Education comprehension: verbalized  understanding, returned demonstration, and verbal cues required  HOME EXERCISE PROGRAM:  Standing hip flexor stretch at counter, prolonged standing with erect posture for gluteal activation, HS curls with RTB  Access Code: [email or web address] URL: [email or web address] Date: 05/15/2023 Prepared by: Connell Kiss  Exercises - Seated Long Arc Quad with Ankle Weight  - 1 x daily - 7 x weekly - 2 sets - 10 reps  GOALS: Goals reviewed with patient? No  SHORT TERM GOALS: Target date: 11/12/2022  Pt will be independent with HEP in order to improve strength and balance in order to decrease fall risk and improve function at home. Baseline: instructed in HEP today, to provide printout next visit Goal status: MET  LONG TERM GOALS: Target date: 12/02/2023  Pt will increase FOTO to at least 50 in order to demonstrate improvement in function related to mobility and QoL. Baseline: 44 7/29:48 9/10:40 12/24/2022= 51 Goal status: MET  2.  Pt (< 60 yrs old) will complete 5xSTS test < 30 seconds indicating an increase in LE strength and improved balance. Baseline: 10/01/22 34 sec (multiple initial attempts with posterior LOB in chair) 12/20: 49 seconds 11/04/22:1:06, min A on multiple attempts to maintain balance and pt stays in flexed posture throughout test when coming to standing.   9/10: 27.5 sec, Pushes WC post consistently, close CGA  10/22: 47 seconds first attempt 12/5: 1 minute, 37 seconds, multiple attempts to get first stand, but no difficulty after that; 03/20/2023= 1 min 2 sec with 1UE on walker and 1 on armrest- some posterior lean- Close CGA; 06/17/2023= 38 sec with 1 UE on walker and 1 on armrest; 08/21/2023= 35 sec with 1UE on walker and 1 on armrest.  Goal  status: ONGOING  3.  Pt will increase to > 0.5 m/s (30 sec) as to improve gait speed for better home ambulation and reduce fall risk. Baseline: 10/01/22 0.096 m/s with RW, close CGA, and WC follow previously 12/27: 0.11 m/s 11/04/22: .056 m/s  9/10: 076 m/s 10/22: 0.64 m/s  11/26: .076 m/s; 03/20/2023= 0.08 m/s; 04/22/2023= 0.089 m/s; 05/15/2023 = 0.079 m/s; 05/27/2023= 0.11 m/s; 07/17/2023= 0.15 m/s; 08/21/2023= 0.14 m/s avg with Bariatric walker; 09/09/2023= 0.14 m/s; 10/02/2023= 0.09 m/s with Bariatric walker and Increased cues to swing RLE through vs. Previous sesisons.  Goal status: ONGOING  4.  Pt will increase hip flexor and abductor  gross strength to 4+/5 bilat as to improve functional strength for independent gait, increased standing tolerance, and increased ADL ability. Baseline: 10/01/22 4+/5, exception hip flexors and abductors 4/5 each bilat  (previously 03/27/22: 4+/5 ) 9/10: Hip flexor 4/5, hip abductor 4+/5 10/22: hip flexor 4/5 hip abductor 4+/5 11/26: hip flexor 4+/5 hip abductor 4+/5 Goal status: MET  5.  Pt will ambulate 150 ft with BRW and WC follow with no rest breaks in less than 12 min to allow for improved mobility within home and increased independence. Baseline: 10/03/22 150' with BRW 04/03/22: 105 ft with BRW 7/29: 100 in 9:30 with pediatric RW 9/10:101 ft with BRW stooped posture worsened throughout test 12/24/2022- 150 feet using BRW, CGA with w/c follow- in 10 min 17 sec. Will keep goal to make sure he can do this consistently 10/22: 119 ft with BRW 8 min 23 seconds terminated by patient  11/26: 148 ft with BRW 14 min 34 seconds terminated by patient 03/20/2023= 152 feet using BRW, CGA and w/c follow in 14 min 3 sec- Need to keep goal active to ensure consistency but will revised  and add a time element to goal. 04/22/2023= 162 feet in 11 min 54 sec using bariatric RW, CGA and close CGA assist; 05/27/2023- 170 feet without rest break using bariatric RW. 06/17/2023= 160 feet  with bariatric 4WW in 9 min- stopped due to fatigue.  Goal status: MET   6. Pt will decrease TUG to below 1 min 30 seconds/decrease in order to demonstrate decreased fall risk. Baseline: 06/17/2023= 2 min using bariatric 4WW; 09/09/2023= 1 min 50 sec with bariatric front wheeled walker.  Goal status: PROGRESSING  7. Pt will ambulate 300 ft with BRW, CGA and WC follow with no rest breaks in less than 20 min to allow for improved mobility within home/community and increased independence. Baseline: 06/17/2023= 160 feet with bariatric 4WW in 9 min- stopped due to fatigue. 07/17/2023- Patient able to walk 270 feet in > 20 min with BRW, CGA and close w/c follow today- VC for posture and for gait sequencing; 08/21/2023- Not specifically testing but patient has been walking 160 feet or more each Visit; 09/09/2023= Patient ambulated 180 feet overall at one time today using bariatric RW, CGA with VC for erect posture and for increased step length. 10/02/2023= 165 feet with Bariatric RW- close w/c follow and CGA. More diffiuclty with gait sequencing more consistent VC for erect posture.  Goal Status: PROGRESSING  ASSESSMENT :  CLINICAL IMPRESSION:   Patient does well in session today. Strength in legs for transfers remains off baseline. Ability to achieve a stable standing posture also remains off baseline, multifactorial via new AFO as well as hip flexor tightness being worse. Pt partakes in >40 STS transfers this date and sustained standing/dual tasking >6 minutes. Patient's condition has the potential to improve in response to therapy. Maximum improvement is yet to be obtained. The anticipated improvement is attainable and reasonable in a generally predictable time.  Patient will benefit from continued skilled physical therapy to further progress his strength and balance to decrease fall risk, improve QOL, and attain therapy goals.    OBJECTIVE IMPAIRMENTS Abnormal gait, decreased activity tolerance, decreased  balance, decreased coordination, decreased endurance, decreased mobility, difficulty walking, decreased ROM, decreased strength, improper body mechanics, postural dysfunction, and obesity.   ACTIVITY LIMITATIONS carrying, lifting, bending, sitting, standing, squatting, stairs, transfers, bed mobility, continence, bathing, toileting, dressing, hygiene/grooming, and locomotion level  PARTICIPATION LIMITATIONS: meal prep, cleaning, laundry, interpersonal relationship, driving, shopping, community activity, and yard work  PERSONAL FACTORS Fitness, Past/current experiences, Time since onset of injury/illness/exacerbation, and 3+ comorbidities: hx of brain tumor, past CVA, HTN, obesity, learning disability, DMII are also affecting patient's functional outcome.   REHAB POTENTIAL: Fair    CLINICAL DECISION MAKING: Stable/uncomplicated  EVALUATION COMPLEXITY: Low  PLAN: PT FREQUENCY: 1-2x/week  PT DURATION: 12 weeks  PLANNED INTERVENTIONS: Therapeutic exercises, Therapeutic activity, Neuromuscular re-education, Balance training, Gait training, Patient/Family education, Joint mobilization, Stair training, Vestibular training, Orthotic/Fit training, DME instructions, Dry Needling, Electrical stimulation, Wheelchair mobility training, Cryotherapy, Moist heat, Compression bandaging, Manual therapy, and Re-evaluation  PLAN FOR NEXT SESSION:  -Continue with functional strengthening exercises of the LE's  -Gait activities using RW. Incorporate proper technique with all transfers to decrease his risk of falling.     Kismet Facemire C, PT Physical Therapist - [place]  [name]  1:32 PM 10/07/23  [facility] [address] [place], [place], [address] Phone: [phone]   Fax:  [phone]

## 2023-10-09 ENCOUNTER — Ambulatory Visit: Payer: 59

## 2023-10-09 DIAGNOSIS — R262 Difficulty in walking, not elsewhere classified: Secondary | ICD-10-CM

## 2023-10-09 DIAGNOSIS — M6281 Muscle weakness (generalized): Secondary | ICD-10-CM

## 2023-10-09 DIAGNOSIS — R278 Other lack of coordination: Secondary | ICD-10-CM

## 2023-10-09 DIAGNOSIS — R2681 Unsteadiness on feet: Secondary | ICD-10-CM

## 2023-10-09 DIAGNOSIS — R269 Unspecified abnormalities of gait and mobility: Secondary | ICD-10-CM

## 2023-10-09 DIAGNOSIS — R296 Repeated falls: Secondary | ICD-10-CM

## 2023-10-09 DIAGNOSIS — R2689 Other abnormalities of gait and mobility: Secondary | ICD-10-CM

## 2023-10-09 NOTE — Therapy (Signed)
 OUTPATIENT PHYSICAL THERAPY TREATMENT Patient Name: Curtis Powell MRN: [id number] DOB:[birth year], 58 y.o., male Today's Date: 10/09/2023  PCP: Sherial Bail, MD  REFERRING PROVIDER: Sherial Bail, MD    PT End of Session - 10/09/23 1336     Visit Number 92    Number of Visits 109    Date for PT Re-Evaluation 12/02/23    Progress Note Due on Visit 100    PT Start Time 1317    PT Stop Time 1357    PT Time Calculation (min) 40 min    Equipment Utilized During Treatment Gait belt    Activity Tolerance Patient tolerated treatment well;No increased pain    Behavior During Therapy WFL for tasks assessed/performed           Past Medical History:  Diagnosis Date   Asthma    GERD (gastroesophageal reflux disease)    Hyperlipidemia    Hypertension    Obesity    Stroke [facility])    Past Surgical History:  Procedure Laterality Date   BRAIN SURGERY     Patient Active Problem List   Diagnosis Date Noted   Impaired glucose tolerance 03/23/2020   Learning disability 03/23/2020   Urinary and fecal incontinence 06/14/2019   Chronic pain syndrome 03/23/2019   Constipation 03/23/2019   Dysarthria 03/23/2019   Dysphagia 03/23/2019   Learning difficulty 03/23/2019   Morbid obesity (HCC) 03/23/2019   Muscle weakness 03/23/2019   Vitamin D deficiency 03/23/2019   Bilateral carotid artery stenosis 12/15/2018   Moderate aortic valve stenosis 12/15/2018   BMI 45.0-49.9, adult (HCC) 11/10/2018   Cerebrovascular accident (HCC) 05/02/2016   Hemiparesis affecting right side as late effect of cerebrovascular accident (HCC) 05/01/2016   Brain tumor (HCC) 11/24/2014   Gastro-esophageal reflux disease without esophagitis 11/24/2014   Accident due to mechanical fall without injury 08/03/2014   Essential hypertension 06/13/2014   Mixed hyperlipidemia 06/13/2014   Unspecified sequelae of cerebral infarction 06/13/2014   Thyromegaly 01/07/2014   Type 2 diabetes mellitus without  complication (HCC) 01/06/2014   Neck mass 09/08/2013   Swimmer's ear 09/08/2013   Erectile dysfunction 01/03/2013   Prediabetes 01/03/2013   ONSET DATE: 05/09/2016  REFERRING DIAG: P30.648 (ICD-10-CM) - Hemiplegia and hemiparesis following cerebral infarction affecting right dominant side   THERAPY DIAG:  Difficulty in walking, not elsewhere classified  Unsteadiness on feet  Muscle weakness (generalized)  Other abnormalities of gait and mobility  Abnormality of gait and mobility  Other lack of coordination  Repeated falls   Rationale for Evaluation and Treatment Rehabilitation  SUBJECTIVE:  SUBJECTIVE STATEMENT No updates medically. No pain today. Pt is excited about Fourth of July party at his Mom's place.   Pt accompanied by: self   PERTINENT HISTORY:  The pt is a pleasant 58 yo male who returns to PT for evaluation of deficits s/o old CVA. Pt with hx of CVA Feb 2018. He was previously fully independent, but CVA caused significant deficits, mainly affecting his right side. Pt reports continued difficulty with walking and transfers. Pt still using WC for majority of mobility, otherwise he uses his RW. Pt lives with his mom who is his main caregiver. Patient had outpatient PT previously in clinic in 2023. He reports since discharge he has been completing his HEP and walking about every day. Pt would like to improve his walking mainly, but also would like to work on balance. He also reports difficulty with transfers. Pt's mom and his caregiver helps him with ADLs. However, he says he is now waiting for a new caregiver. Prior to this pt had caregiver 3x/week. PMH significant for hx of brain tumor, past CVA, DMII and HTN. See chart for additional information.  PAIN:  Are you having pain? Pt reports  no pain currently  PRECAUTIONS: fall  WEIGHT BEARING RESTRICTIONS none  PLOF: required assistance with ADLs (old CVA), prior to CVA in 2018 was indep.  History of Falls: Pt reports no recent falls  Home information: Pt lives with mother who is his primary caregiver. Previously had home aide, 3x/week but reports currently waiting for new one.  PATIENT GOALS: Pt would like to improve his ability to ambulate.  OBJECTIVE:  taken at [facility] unless otherwise noted:   DIAGNOSTIC FINDINGS: No recent imaging per chart  Objective measures taken at eval unless otherwise specified:   Posture: forward head posture, rounded shoulders  MMT LE: grossly  4+/5, exception hip flexors and abductors 4/5 each bilat   Functional tests:  5xSTS:  34 sec, use of BUE (multiple initial attempts with posterior LOB in chair)   : 0.096 m/s with RW, close CGA, and WC follow  Gait for distance (goal 150 ft): deferred due to time  Gait mechanics: impaired, decreased speed (see ), crouched posture, poor postural stability and relies heavily on BUE support on RW. Pt now with bilat AFOS   FOTO: 44 (goal 50)   TODAY'S TREATMENT 10/09/23  -39ft AMB, YRW, minGuafrd asssit wth WC follow -Seated downward cable row 1x10 @ 27.5lb -seated cable assisted RLE march, cable resisted RLE hip extension 1x10 (RLE only; DC due to limited ROM availabe)  -STS pull to // bar,minguard: pelvis press to bar x60secH  -STS pull to // bar, minGuard 1x12 -STS pull to // bar, minGuard 1x12 (1 hand bar, 1 hand push off arm rest)  -side stepping along outside of //bar 1x98ft bilat  -side stepping along outside of //bar 1x25ft bilat    10/07/23  Wearing B LE custom AFOs. PT applied gait belt at start of session, CGA provided throughout session, unless otherwise stated for improved safety   -STS from [facility] to BRW x2, modA  -Seated marching x30, alterating -seated wand fleixon over thoracic towel roll in WC x15 (AA/ROM)  -STS from  WC + airex pad to BRW x8 minA  -Seated marching x30, alterating -seated wand fleixon over thoracic towel roll in WC x15 (AA/ROM)  -STS from WC + airex pad to single // bar x10 -Seated marching x30, alterating -seated wand fleixon over thoracic towel roll in WC x15 (AA/ROM)  -  STS from [facility] + airex pad to single // bar x10 -STS from [facility] // bar x10 -STS from [facility] // bar x1 + sustained standing c 1 hand support, while flipping 52 card over and naming out load (8 minutes total, then cut off)  -AMB 43ft c BRW, minGuard assist.  -decided to end after this, pt notrecovering well with seated rest break, ha snot eaten lunch.  -137/26mmHg 100% SpO2, 91bpm    PATIENT EDUCATION: Education details: If pt can lean pelvis into support surface, his energy efficicency will be higher.  Person educated: Patient Education method: Explanation, Demonstration, Tactile cues, and Verbal cues Education comprehension: verbalized understanding, returned demonstration, and verbal cues required  HOME EXERCISE PROGRAM:  Standing hip flexor stretch at counter, prolonged standing with erect posture for gluteal activation, HS curls with RTB  Access Code: [email or web address] URL: [email or web address] Date: 05/15/2023 Prepared by: Connell Kiss  Exercises - Seated Long Arc Quad with Ankle Weight  - 1 x daily - 7 x weekly - 2 sets - 10 reps  GOALS: Goals reviewed with patient? No  SHORT TERM GOALS: Target date: 11/12/2022  Pt will be independent with HEP in order to improve strength and balance in order to decrease fall risk and improve function at home. Baseline: instructed in HEP today, to provide printout next visit Goal status: MET  LONG TERM GOALS: Target date: 12/02/2023  Pt will increase FOTO to at least 50 in order to demonstrate improvement in function related to mobility and QoL. Baseline: 44 7/29:48 9/10:40 12/24/2022= 51 Goal status: MET  2.  Pt (< 60 yrs old) will complete 5xSTS test < 30 seconds  indicating an increase in LE strength and improved balance. Baseline: 10/01/22 34 sec (multiple initial attempts with posterior LOB in chair) 12/20: 49 seconds 11/04/22:1:06, min A on multiple attempts to maintain balance and pt stays in flexed posture throughout test when coming to standing.   9/10: 27.5 sec, Pushes WC post consistently, close CGA  10/22: 47 seconds first attempt 12/5: 1 minute, 37 seconds, multiple attempts to get first stand, but no difficulty after that; 03/20/2023= 1 min 2 sec with 1UE on walker and 1 on armrest- some posterior lean- Close CGA; 06/17/2023= 38 sec with 1 UE on walker and 1 on armrest; 08/21/2023= 35 sec with 1UE on walker and 1 on armrest.  Goal status: ONGOING  3.  Pt will increase to > 0.5 m/s (30 sec) as to improve gait speed for better home ambulation and reduce fall risk. Baseline: 10/01/22 0.096 m/s with RW, close CGA, and WC follow previously 12/27: 0.11 m/s 11/04/22: .056 m/s  9/10: 076 m/s 10/22: 0.64 m/s  11/26: .076 m/s; 03/20/2023= 0.08 m/s; 04/22/2023= 0.089 m/s; 05/15/2023 = 0.079 m/s; 05/27/2023= 0.11 m/s; 07/17/2023= 0.15 m/s; 08/21/2023= 0.14 m/s avg with Bariatric walker; 09/09/2023= 0.14 m/s; 10/02/2023= 0.09 m/s with Bariatric walker and Increased cues to swing RLE through vs. Previous sesisons.  Goal status: ONGOING  4.  Pt will increase hip flexor and abductor  gross strength to 4+/5 bilat as to improve functional strength for independent gait, increased standing tolerance, and increased ADL ability. Baseline: 10/01/22 4+/5, exception hip flexors and abductors 4/5 each bilat  (previously 03/27/22: 4+/5 ) 9/10: Hip flexor 4/5, hip abductor 4+/5 10/22: hip flexor 4/5 hip abductor 4+/5 11/26: hip flexor 4+/5 hip abductor 4+/5 Goal status: MET  5.  Pt will ambulate 150 ft with BRW and WC follow with no rest breaks in less than 12 min  to allow for improved mobility within home and increased independence. Baseline: 10/03/22 150' with BRW 04/03/22: 105  ft with BRW 7/29: 100 in 9:30 with pediatric RW 9/10:101 ft with BRW stooped posture worsened throughout test 12/24/2022- 150 feet using BRW, CGA with w/c follow- in 10 min 17 sec. Will keep goal to make sure he can do this consistently 10/22: 119 ft with BRW 8 min 23 seconds terminated by patient  11/26: 148 ft with BRW 14 min 34 seconds terminated by patient 03/20/2023= 152 feet using BRW, CGA and w/c follow in 14 min 3 sec- Need to keep goal active to ensure consistency but will revised and add a time element to goal. 04/22/2023= 162 feet in 11 min 54 sec using bariatric RW, CGA and close CGA assist; 05/27/2023- 170 feet without rest break using bariatric RW. 06/17/2023= 160 feet with bariatric 4WW in 9 min- stopped due to fatigue.  Goal status: MET   6. Pt will decrease TUG to below 1 min 30 seconds/decrease in order to demonstrate decreased fall risk. Baseline: 06/17/2023= 2 min using bariatric 4WW; 09/09/2023= 1 min 50 sec with bariatric front wheeled walker.  Goal status: PROGRESSING  7. Pt will ambulate 300 ft with BRW, CGA and WC follow with no rest breaks in less than 20 min to allow for improved mobility within home/community and increased independence. Baseline: 06/17/2023= 160 feet with bariatric 4WW in 9 min- stopped due to fatigue. 07/17/2023- Patient able to walk 270 feet in > 20 min with BRW, CGA and close w/c follow today- VC for posture and for gait sequencing; 08/21/2023- Not specifically testing but patient has been walking 160 feet or more each Visit; 09/09/2023= Patient ambulated 180 feet overall at one time today using bariatric RW, CGA with VC for erect posture and for increased step length. 10/02/2023= 165 feet with Bariatric RW- close w/c follow and CGA. More diffiuclty with gait sequencing more consistent VC for erect posture.  Goal Status: PROGRESSING  ASSESSMENT :  CLINICAL IMPRESSION:   Patient does well in session today. Strength in legs for transfers remains off baseline.  Ability to achieve a stable standing posture also remains off baseline. Maximum improvement is yet to be obtained. The anticipated improvement is attainable and reasonable in a generally predictable time.  Patient will benefit from continued skilled physical therapy to further progress his strength and balance to decrease fall risk, improve QOL, and attain therapy goals.    OBJECTIVE IMPAIRMENTS Abnormal gait, decreased activity tolerance, decreased balance, decreased coordination, decreased endurance, decreased mobility, difficulty walking, decreased ROM, decreased strength, improper body mechanics, postural dysfunction, and obesity.   ACTIVITY LIMITATIONS carrying, lifting, bending, sitting, standing, squatting, stairs, transfers, bed mobility, continence, bathing, toileting, dressing, hygiene/grooming, and locomotion level  PARTICIPATION LIMITATIONS: meal prep, cleaning, laundry, interpersonal relationship, driving, shopping, community activity, and yard work  PERSONAL FACTORS Fitness, Past/current experiences, Time since onset of injury/illness/exacerbation, and 3+ comorbidities: hx of brain tumor, past CVA, HTN, obesity, learning disability, DMII are also affecting patient's functional outcome.   REHAB POTENTIAL: Fair    CLINICAL DECISION MAKING: Stable/uncomplicated  EVALUATION COMPLEXITY: Low  PLAN: PT FREQUENCY: 1-2x/week  PT DURATION: 12 weeks  PLANNED INTERVENTIONS: Therapeutic exercises, Therapeutic activity, Neuromuscular re-education, Balance training, Gait training, Patient/Family education, Joint mobilization, Stair training, Vestibular training, Orthotic/Fit training, DME instructions, Dry Needling, Electrical stimulation, Wheelchair mobility training, Cryotherapy, Moist heat, Compression bandaging, Manual therapy, and Re-evaluation  PLAN FOR NEXT SESSION:  -Continue with functional strengthening exercises of the LE's  -  Gait activities using RW. Incorporate proper technique  with all transfers to decrease his risk of falling.     Berl Bonfanti C, PT Physical Therapist - [place]  [facility]  1:38 PM 10/09/23  [facility] at [facility] [address] [place], [place], [address] Phone: [phone]   Fax:  [phone] 1:38 PM, 10/09/23 Peggye JAYSON Linear, PT, DPT Physical Therapist - [facility]  Outpatient Physical Therapy- Main Campus [phone]

## 2023-10-14 ENCOUNTER — Ambulatory Visit: Payer: 59

## 2023-10-14 DIAGNOSIS — M6281 Muscle weakness (generalized): Secondary | ICD-10-CM

## 2023-10-14 DIAGNOSIS — R262 Difficulty in walking, not elsewhere classified: Secondary | ICD-10-CM

## 2023-10-14 DIAGNOSIS — R278 Other lack of coordination: Secondary | ICD-10-CM

## 2023-10-14 DIAGNOSIS — R269 Unspecified abnormalities of gait and mobility: Secondary | ICD-10-CM

## 2023-10-14 DIAGNOSIS — R296 Repeated falls: Secondary | ICD-10-CM

## 2023-10-14 DIAGNOSIS — R531 Weakness: Secondary | ICD-10-CM

## 2023-10-14 DIAGNOSIS — R2689 Other abnormalities of gait and mobility: Secondary | ICD-10-CM

## 2023-10-14 DIAGNOSIS — R2681 Unsteadiness on feet: Secondary | ICD-10-CM

## 2023-10-14 NOTE — Therapy (Signed)
 OUTPATIENT PHYSICAL THERAPY TREATMENT Patient Name: Curtis Powell MRN: [id number] DOB:08-Aug-1965, 58 y.o., male Today's Date: 10/14/2023  PCP: Sherial Bail, MD  REFERRING PROVIDER: Sherial Bail, MD    PT End of Session - 10/14/23 1355     Visit Number [id number]    Number of Visits 109    Date for PT Re-Evaluation 12/02/23    Progress Note Due on Visit 100    PT Start Time 1400    PT Stop Time 1443    PT Time Calculation (min) 43 min    Equipment Utilized During Treatment Gait belt    Activity Tolerance Patient tolerated treatment well;No increased pain    Behavior During Therapy WFL for tasks assessed/performed           Past Medical History:  Diagnosis Date   Asthma    GERD (gastroesophageal reflux disease)    Hyperlipidemia    Hypertension    Obesity    Stroke [facility])    Past Surgical History:  Procedure Laterality Date   BRAIN SURGERY     Patient Active Problem List   Diagnosis Date Noted   Impaired glucose tolerance 03/23/2020   Learning disability 03/23/2020   Urinary and fecal incontinence 06/14/2019   Chronic pain syndrome 03/23/2019   Constipation 03/23/2019   Dysarthria 03/23/2019   Dysphagia 03/23/2019   Learning difficulty 03/23/2019   Morbid obesity (HCC) 03/23/2019   Muscle weakness 03/23/2019   Vitamin D deficiency 03/23/2019   Bilateral carotid artery stenosis 12/15/2018   Moderate aortic valve stenosis 12/15/2018   BMI 45.0-49.9, adult (HCC) 11/10/2018   Cerebrovascular accident (HCC) 05/02/2016   Hemiparesis affecting right side as late effect of cerebrovascular accident (HCC) 05/01/2016   Brain tumor (HCC) 11/24/2014   Gastro-esophageal reflux disease without esophagitis 11/24/2014   Accident due to mechanical fall without injury 08/03/2014   Essential hypertension 06/13/2014   Mixed hyperlipidemia 06/13/2014   Unspecified sequelae of cerebral infarction 06/13/2014   Thyromegaly 01/07/2014   Type 2 diabetes mellitus without  complication (HCC) 01/06/2014   Neck mass 09/08/2013   Swimmer's ear 09/08/2013   Erectile dysfunction 01/03/2013   Prediabetes 01/03/2013   ONSET DATE: 05/09/2016  REFERRING DIAG: P30.648 (ICD-10-CM) - Hemiplegia and hemiparesis following cerebral infarction affecting right dominant side   THERAPY DIAG:  Difficulty in walking, not elsewhere classified  Unsteadiness on feet  Muscle weakness (generalized)  Other abnormalities of gait and mobility  Abnormality of gait and mobility  Other lack of coordination  Repeated falls  Left-sided weakness   Rationale for Evaluation and Treatment Rehabilitation  SUBJECTIVE:  SUBJECTIVE STATEMENT Patient reports he is working hard at home these days- trying to stand and exercise more. Denies any falls over the holiday weekend.   Pt accompanied by: self   PERTINENT HISTORY:  The pt is a pleasant 58 yo male who returns to PT for evaluation of deficits s/o old CVA. Pt with hx of CVA Feb 2018. He was previously fully independent, but CVA caused significant deficits, mainly affecting his right side. Pt reports continued difficulty with walking and transfers. Pt still using WC for majority of mobility, otherwise he uses his RW. Pt lives with his mom who is his main caregiver. Patient had outpatient PT previously in clinic in 2023. He reports since discharge he has been completing his HEP and walking about every day. Pt would like to improve his walking mainly, but also would like to work on balance. He also reports difficulty with transfers. Pt's mom and his caregiver helps him with ADLs. However, he says he is now waiting for a new caregiver. Prior to this pt had caregiver 3x/week. PMH significant for hx of brain tumor, past CVA, DMII and HTN. See chart for  additional information.  PAIN:  Are you having pain? Pt reports no pain currently  PRECAUTIONS: fall  WEIGHT BEARING RESTRICTIONS none  PLOF: required assistance with ADLs (old CVA), prior to CVA in 2018 was indep.  History of Falls: Pt reports no recent falls  Home information: Pt lives with mother who is his primary caregiver. Previously had home aide, 3x/week but reports currently waiting for new one.  PATIENT GOALS: Pt would like to improve his ability to ambulate.  OBJECTIVE:  taken at [facility] unless otherwise noted:   DIAGNOSTIC FINDINGS: No recent imaging per chart  Objective measures taken at eval unless otherwise specified:   Posture: forward head posture, rounded shoulders  MMT LE: grossly  4+/5, exception hip flexors and abductors 4/5 each bilat   Functional tests:  5xSTS:  34 sec, use of BUE (multiple initial attempts with posterior LOB in chair)   : 0.096 m/s with RW, close CGA, and WC follow  Gait for distance (goal 150 ft): deferred due to time  Gait mechanics: impaired, decreased speed (see ), crouched posture, poor postural stability and relies heavily on BUE support on RW. Pt now with bilat AFOS   FOTO: 44 (goal 50)   TODAY'S TREATMENT 10/14/23     Therapeutic Activities: dynamic therapeutic activities  designed to achieve improved functional performance  -STS pull to // bar, minGuard 1x12 - Ambulation in // bars - down and back x 2 (VC for safe turns)  -STS LUE on armrest and RUE on // bar- x 12 reps - Fwd ambulation then retro gait in // bars (counting steps- initially 20/23 fwd/bwd improved down to 14/20 at best x 3 trials)   NMR Static stand without UE Support and VC for erect posture: 3 min with intermittent VC to stand as erect as possible.  Static stand in // bars with dynamic UE shoulder flex to facilitate erect posture and dynamic balance.   -Seated face pulls-cable row 2x10 @ 17.5lb -Seated scap retraction- 2x 10 @ 22.5 lb.   -Seated low pull 2 x 10 reps     PATIENT EDUCATION: Education details: If pt can lean pelvis into support surface, his energy efficicency will be higher.  Person educated: Patient Education method: Explanation, Demonstration, Tactile cues, and Verbal cues Education comprehension: verbalized understanding, returned demonstration, and verbal cues required  HOME EXERCISE PROGRAM:  Standing hip flexor stretch at counter, prolonged standing with erect posture for gluteal activation, HS curls with RTB  Access Code: [email or web address] URL: [email or web address] Date: 05/15/2023 Prepared by: Connell Kiss  Exercises - Seated Long Arc Quad with Ankle Weight  - 1 x daily - 7 x weekly - 2 sets - 10 reps  GOALS: Goals reviewed with patient? No  SHORT TERM GOALS: Target date: 11/12/2022  Pt will be independent with HEP in order to improve strength and balance in order to decrease fall risk and improve function at home. Baseline: instructed in HEP today, to provide printout next visit Goal status: MET  LONG TERM GOALS: Target date: 12/02/2023  Pt will increase FOTO to at least 50 in order to demonstrate improvement in function related to mobility and QoL. Baseline: 44 7/29:48 9/10:40 12/24/2022= 51 Goal status: MET  2.  Pt (< 60 yrs old) will complete 5xSTS test < 30 seconds indicating an increase in LE strength and improved balance. Baseline: 10/01/22 34 sec (multiple initial attempts with posterior LOB in chair) 12/20: 49 seconds 11/04/22:1:06, min A on multiple attempts to maintain balance and pt stays in flexed posture throughout test when coming to standing.   9/10: 27.5 sec, Pushes WC post consistently, close CGA  10/22: 47 seconds first attempt 12/5: 1 minute, 37 seconds, multiple attempts to get first stand, but no difficulty after that; 03/20/2023= 1 min 2 sec with 1UE on walker and 1 on armrest- some posterior lean- Close CGA; 06/17/2023= 38 sec with 1 UE on walker and 1 on  armrest; 08/21/2023= 35 sec with 1UE on walker and 1 on armrest.  Goal status: ONGOING  3.  Pt will increase to > 0.5 m/s (30 sec) as to improve gait speed for better home ambulation and reduce fall risk. Baseline: 10/01/22 0.096 m/s with RW, close CGA, and WC follow previously 12/27: 0.11 m/s 11/04/22: .056 m/s  9/10: 076 m/s 10/22: 0.64 m/s  11/26: .076 m/s; 03/20/2023= 0.08 m/s; 04/22/2023= 0.089 m/s; 05/15/2023 = 0.079 m/s; 05/27/2023= 0.11 m/s; 07/17/2023= 0.15 m/s; 08/21/2023= 0.14 m/s avg with Bariatric walker; 09/09/2023= 0.14 m/s; 10/02/2023= 0.09 m/s with Bariatric walker and Increased cues to swing RLE through vs. Previous sesisons.  Goal status: ONGOING  4.  Pt will increase hip flexor and abductor  gross strength to 4+/5 bilat as to improve functional strength for independent gait, increased standing tolerance, and increased ADL ability. Baseline: 10/01/22 4+/5, exception hip flexors and abductors 4/5 each bilat  (previously 03/27/22: 4+/5 ) 9/10: Hip flexor 4/5, hip abductor 4+/5 10/22: hip flexor 4/5 hip abductor 4+/5 11/26: hip flexor 4+/5 hip abductor 4+/5 Goal status: MET  5.  Pt will ambulate 150 ft with BRW and WC follow with no rest breaks in less than 12 min to allow for improved mobility within home and increased independence. Baseline: 10/03/22 150' with BRW 04/03/22: 105 ft with BRW 7/29: 100 in 9:30 with pediatric RW 9/10:101 ft with BRW stooped posture worsened throughout test 12/24/2022- 150 feet using BRW, CGA with w/c follow- in 10 min 17 sec. Will keep goal to make sure he can do this consistently 10/22: 119 ft with BRW 8 min 23 seconds terminated by patient  11/26: 148 ft with BRW 14 min 34 seconds terminated by patient 03/20/2023= 152 feet using BRW, CGA and w/c follow in 14 min 3 sec- Need to keep goal active to ensure consistency but will revised and add a time element to goal. 04/22/2023= 162 feet in 11  min 54 sec using bariatric RW, CGA and close CGA assist;  05/27/2023- 170 feet without rest break using bariatric RW. 06/17/2023= 160 feet with bariatric 4WW in 9 min- stopped due to fatigue.  Goal status: MET   6. Pt will decrease TUG to below 1 min 30 seconds/decrease in order to demonstrate decreased fall risk. Baseline: 06/17/2023= 2 min using bariatric 4WW; 09/09/2023= 1 min 50 sec with bariatric front wheeled walker.  Goal status: PROGRESSING  7. Pt will ambulate 300 ft with BRW, CGA and WC follow with no rest breaks in less than 20 min to allow for improved mobility within home/community and increased independence. Baseline: 06/17/2023= 160 feet with bariatric 4WW in 9 min- stopped due to fatigue. 07/17/2023- Patient able to walk 270 feet in > 20 min with BRW, CGA and close w/c follow today- VC for posture and for gait sequencing; 08/21/2023- Not specifically testing but patient has been walking 160 feet or more each Visit; 09/09/2023= Patient ambulated 180 feet overall at one time today using bariatric RW, CGA with VC for erect posture and for increased step length. 10/02/2023= 165 feet with Bariatric RW- close w/c follow and CGA. More diffiuclty with gait sequencing more consistent VC for erect posture.  Goal Status: PROGRESSING  ASSESSMENT :  CLINICAL IMPRESSION:   Treatment continued today with focus on standing activities in // bars-working on taking increased step length both fwd/bwd. Patient was able to demonstrate good ability to complete and progress with step length. He was later able to work on some postural strengthening with good ability to complete with only fatigue.   Patient will benefit from continued skilled physical therapy to further progress his strength and balance to decrease fall risk, improve QOL, and attain therapy goals.    OBJECTIVE IMPAIRMENTS Abnormal gait, decreased activity tolerance, decreased balance, decreased coordination, decreased endurance, decreased mobility, difficulty walking, decreased ROM, decreased strength,  improper body mechanics, postural dysfunction, and obesity.   ACTIVITY LIMITATIONS carrying, lifting, bending, sitting, standing, squatting, stairs, transfers, bed mobility, continence, bathing, toileting, dressing, hygiene/grooming, and locomotion level  PARTICIPATION LIMITATIONS: meal prep, cleaning, laundry, interpersonal relationship, driving, shopping, community activity, and yard work  PERSONAL FACTORS Fitness, Past/current experiences, Time since onset of injury/illness/exacerbation, and 3+ comorbidities: hx of brain tumor, past CVA, HTN, obesity, learning disability, DMII are also affecting patient's functional outcome.   REHAB POTENTIAL: Fair    CLINICAL DECISION MAKING: Stable/uncomplicated  EVALUATION COMPLEXITY: Low  PLAN: PT FREQUENCY: 1-2x/week  PT DURATION: 12 weeks  PLANNED INTERVENTIONS: Therapeutic exercises, Therapeutic activity, Neuromuscular re-education, Balance training, Gait training, Patient/Family education, Joint mobilization, Stair training, Vestibular training, Orthotic/Fit training, DME instructions, Dry Needling, Electrical stimulation, Wheelchair mobility training, Cryotherapy, Moist heat, Compression bandaging, Manual therapy, and Re-evaluation  PLAN FOR NEXT SESSION:  -Continue with functional strengthening exercises of the LE's  -Gait activities using RW. Incorporate proper technique with all transfers to decrease his risk of falling.     Reyes LOISE London, PT Physical Therapist - [place]  [facility]  2:52 PM 10/14/23  [facility] at [facility] [address] [place], [place], [address] Phone: [phone]   Fax:  [phone] 2:52 PM, 10/14/23

## 2023-10-16 ENCOUNTER — Ambulatory Visit: Payer: 59

## 2023-10-21 ENCOUNTER — Ambulatory Visit: Payer: 59

## 2023-10-21 DIAGNOSIS — R296 Repeated falls: Secondary | ICD-10-CM

## 2023-10-21 DIAGNOSIS — R531 Weakness: Secondary | ICD-10-CM

## 2023-10-21 DIAGNOSIS — R2689 Other abnormalities of gait and mobility: Secondary | ICD-10-CM

## 2023-10-21 DIAGNOSIS — R262 Difficulty in walking, not elsewhere classified: Secondary | ICD-10-CM | POA: Diagnosis not present

## 2023-10-21 DIAGNOSIS — R278 Other lack of coordination: Secondary | ICD-10-CM

## 2023-10-21 DIAGNOSIS — R269 Unspecified abnormalities of gait and mobility: Secondary | ICD-10-CM

## 2023-10-21 DIAGNOSIS — R2681 Unsteadiness on feet: Secondary | ICD-10-CM

## 2023-10-21 DIAGNOSIS — M6281 Muscle weakness (generalized): Secondary | ICD-10-CM

## 2023-10-21 NOTE — Therapy (Signed)
 OUTPATIENT PHYSICAL THERAPY TREATMENT Patient Name: Curtis Powell MRN: [id number] DOB:07/05/1965, 58 y.o., male Today's Date: 10/21/2023  PCP: Sherial Bail, MD  REFERRING PROVIDER: Sherial Bail, MD    PT End of Session - 10/21/23 1327     Visit Number 94    Number of Visits 109    Date for PT Re-Evaluation 12/02/23    Progress Note Due on Visit 100    PT Start Time 1313    PT Stop Time 1358    PT Time Calculation (min) 45 min    Equipment Utilized During Treatment Gait belt    Activity Tolerance Patient tolerated treatment well;No increased pain    Behavior During Therapy WFL for tasks assessed/performed           Past Medical History:  Diagnosis Date   Asthma    GERD (gastroesophageal reflux disease)    Hyperlipidemia    Hypertension    Obesity    Stroke [facility])    Past Surgical History:  Procedure Laterality Date   BRAIN SURGERY     Patient Active Problem List   Diagnosis Date Noted   Impaired glucose tolerance 03/23/2020   Learning disability 03/23/2020   Urinary and fecal incontinence 06/14/2019   Chronic pain syndrome 03/23/2019   Constipation 03/23/2019   Dysarthria 03/23/2019   Dysphagia 03/23/2019   Learning difficulty 03/23/2019   Morbid obesity (HCC) 03/23/2019   Muscle weakness 03/23/2019   Vitamin D deficiency 03/23/2019   Bilateral carotid artery stenosis 12/15/2018   Moderate aortic valve stenosis 12/15/2018   BMI 45.0-49.9, adult (HCC) 11/10/2018   Cerebrovascular accident (HCC) 05/02/2016   Hemiparesis affecting right side as late effect of cerebrovascular accident (HCC) 05/01/2016   Brain tumor (HCC) 11/24/2014   Gastro-esophageal reflux disease without esophagitis 11/24/2014   Accident due to mechanical fall without injury 08/03/2014   Essential hypertension 06/13/2014   Mixed hyperlipidemia 06/13/2014   Unspecified sequelae of cerebral infarction 06/13/2014   Thyromegaly 01/07/2014   Type 2 diabetes mellitus without  complication (HCC) 01/06/2014   Neck mass 09/08/2013   Swimmer's ear 09/08/2013   Erectile dysfunction 01/03/2013   Prediabetes 01/03/2013   ONSET DATE: 05/09/2016  REFERRING DIAG: P30.648 (ICD-10-CM) - Hemiplegia and hemiparesis following cerebral infarction affecting right dominant side   THERAPY DIAG:  Difficulty in walking, not elsewhere classified  Unsteadiness on feet  Muscle weakness (generalized)  Other abnormalities of gait and mobility  Abnormality of gait and mobility  Other lack of coordination  Repeated falls  Left-sided weakness   Rationale for Evaluation and Treatment Rehabilitation  SUBJECTIVE:  SUBJECTIVE STATEMENT Patient reports He had a fall in home last week - stumbled over his feet and fell forward - no injuries reported.    Pt accompanied by: self   PERTINENT HISTORY:  The pt is a pleasant 58 yo male who returns to PT for evaluation of deficits s/o old CVA. Pt with hx of CVA Feb 2018. He was previously fully independent, but CVA caused significant deficits, mainly affecting his right side. Pt reports continued difficulty with walking and transfers. Pt still using WC for majority of mobility, otherwise he uses his RW. Pt lives with his mom who is his main caregiver. Patient had outpatient PT previously in clinic in 2023. He reports since discharge he has been completing his HEP and walking about every day. Pt would like to improve his walking mainly, but also would like to work on balance. He also reports difficulty with transfers. Pt's mom and his caregiver helps him with ADLs. However, he says he is now waiting for a new caregiver. Prior to this pt had caregiver 3x/week. PMH significant for hx of brain tumor, past CVA, DMII and HTN. See chart for additional  information.  PAIN:  Are you having pain? Pt reports no pain currently  PRECAUTIONS: fall  WEIGHT BEARING RESTRICTIONS none  PLOF: required assistance with ADLs (old CVA), prior to CVA in 2018 was indep.  History of Falls: Pt reports no recent falls  Home information: Pt lives with mother who is his primary caregiver. Previously had home aide, 3x/week but reports currently waiting for new one.  PATIENT GOALS: Pt would like to improve his ability to ambulate.  OBJECTIVE:  taken at [facility] unless otherwise noted:   DIAGNOSTIC FINDINGS: No recent imaging per chart  Objective measures taken at eval unless otherwise specified:   Posture: forward head posture, rounded shoulders  MMT LE: grossly  4+/5, exception hip flexors and abductors 4/5 each bilat   Functional tests:  5xSTS:  34 sec, use of BUE (multiple initial attempts with posterior LOB in chair)   : 0.096 m/s with RW, close CGA, and WC follow  Gait for distance (goal 150 ft): deferred due to time  Gait mechanics: impaired, decreased speed (see ), crouched posture, poor postural stability and relies heavily on BUE support on RW. Pt now with bilat AFOS   FOTO: 44 (goal 50)   TODAY'S TREATMENT 10/21/23     Therapeutic Activities: dynamic therapeutic activities  designed to achieve improved functional performance  -STS pull to // bar with 1UE and other pushing up from armrest, minGuard 1x12 -Step tap onto spike balls (PT calling out color and patient tapping appropriate spike ball) x 3 min x 2   -STS LUE on armrest and RUE on // bar- x 12 reps   -  NMR Static stand without UE Support and VC for erect posture: 7 min with intermittent VC to stand as erect as possible.  Static stand in // bars with dynamic UE shoulder flex x 12  to facilitate erect posture and dynamic balance.  Standing with marker in hand and reaching overhead/diagonal  to mark as high as possible.  2 x 10 reps  Static stand with dynamic  weight shifting x 20 reps each side.       PATIENT EDUCATION: Education details: If pt can lean pelvis into support surface, his energy efficicency will be higher.  Person educated: Patient Education method: Explanation, Demonstration, Tactile cues, and Verbal cues Education comprehension: verbalized understanding, returned demonstration, and verbal  cues required  HOME EXERCISE PROGRAM:  Standing hip flexor stretch at counter, prolonged standing with erect posture for gluteal activation, HS curls with RTB  Access Code: [email or web address] URL: [email or web address] Date: 05/15/2023 Prepared by: Connell Kiss  Exercises - Seated Long Arc Quad with Ankle Weight  - 1 x daily - 7 x weekly - 2 sets - 10 reps  GOALS: Goals reviewed with patient? No  SHORT TERM GOALS: Target date: 11/12/2022  Pt will be independent with HEP in order to improve strength and balance in order to decrease fall risk and improve function at home. Baseline: instructed in HEP today, to provide printout next visit Goal status: MET  LONG TERM GOALS: Target date: 12/02/2023  Pt will increase FOTO to at least 50 in order to demonstrate improvement in function related to mobility and QoL. Baseline: 44 7/29:48 9/10:40 12/24/2022= 51 Goal status: MET  2.  Pt (< 60 yrs old) will complete 5xSTS test < 30 seconds indicating an increase in LE strength and improved balance. Baseline: 10/01/22 34 sec (multiple initial attempts with posterior LOB in chair) 12/20: 49 seconds 11/04/22:1:06, min A on multiple attempts to maintain balance and pt stays in flexed posture throughout test when coming to standing.   9/10: 27.5 sec, Pushes WC post consistently, close CGA  10/22: 47 seconds first attempt 12/5: 1 minute, 37 seconds, multiple attempts to get first stand, but no difficulty after that; 03/20/2023= 1 min 2 sec with 1UE on walker and 1 on armrest- some posterior lean- Close CGA; 06/17/2023= 38 sec with 1 UE on walker and 1  on armrest; 08/21/2023= 35 sec with 1UE on walker and 1 on armrest.  Goal status: ONGOING  3.  Pt will increase to > 0.5 m/s (30 sec) as to improve gait speed for better home ambulation and reduce fall risk. Baseline: 10/01/22 0.096 m/s with RW, close CGA, and WC follow previously 12/27: 0.11 m/s 11/04/22: .056 m/s  9/10: 076 m/s 10/22: 0.64 m/s  11/26: .076 m/s; 03/20/2023= 0.08 m/s; 04/22/2023= 0.089 m/s; 05/15/2023 = 0.079 m/s; 05/27/2023= 0.11 m/s; 07/17/2023= 0.15 m/s; 08/21/2023= 0.14 m/s avg with Bariatric walker; 09/09/2023= 0.14 m/s; 10/02/2023= 0.09 m/s with Bariatric walker and Increased cues to swing RLE through vs. Previous sesisons.  Goal status: ONGOING  4.  Pt will increase hip flexor and abductor  gross strength to 4+/5 bilat as to improve functional strength for independent gait, increased standing tolerance, and increased ADL ability. Baseline: 10/01/22 4+/5, exception hip flexors and abductors 4/5 each bilat  (previously 03/27/22: 4+/5 ) 9/10: Hip flexor 4/5, hip abductor 4+/5 10/22: hip flexor 4/5 hip abductor 4+/5 11/26: hip flexor 4+/5 hip abductor 4+/5 Goal status: MET  5.  Pt will ambulate 150 ft with BRW and WC follow with no rest breaks in less than 12 min to allow for improved mobility within home and increased independence. Baseline: 10/03/22 150' with BRW 04/03/22: 105 ft with BRW 7/29: 100 in 9:30 with pediatric RW 9/10:101 ft with BRW stooped posture worsened throughout test 12/24/2022- 150 feet using BRW, CGA with w/c follow- in 10 min 17 sec. Will keep goal to make sure he can do this consistently 10/22: 119 ft with BRW 8 min 23 seconds terminated by patient  11/26: 148 ft with BRW 14 min 34 seconds terminated by patient 03/20/2023= 152 feet using BRW, CGA and w/c follow in 14 min 3 sec- Need to keep goal active to ensure consistency but will revised and add a time element  to goal. 04/22/2023= 162 feet in 11 min 54 sec using bariatric RW, CGA and close CGA assist;  05/27/2023- 170 feet without rest break using bariatric RW. 06/17/2023= 160 feet with bariatric 4WW in 9 min- stopped due to fatigue.  Goal status: MET   6. Pt will decrease TUG to below 1 min 30 seconds/decrease in order to demonstrate decreased fall risk. Baseline: 06/17/2023= 2 min using bariatric 4WW; 09/09/2023= 1 min 50 sec with bariatric front wheeled walker.  Goal status: PROGRESSING  7. Pt will ambulate 300 ft with BRW, CGA and WC follow with no rest breaks in less than 20 min to allow for improved mobility within home/community and increased independence. Baseline: 06/17/2023= 160 feet with bariatric 4WW in 9 min- stopped due to fatigue. 07/17/2023- Patient able to walk 270 feet in > 20 min with BRW, CGA and close w/c follow today- VC for posture and for gait sequencing; 08/21/2023- Not specifically testing but patient has been walking 160 feet or more each Visit; 09/09/2023= Patient ambulated 180 feet overall at one time today using bariatric RW, CGA with VC for erect posture and for increased step length. 10/02/2023= 165 feet with Bariatric RW- close w/c follow and CGA. More diffiuclty with gait sequencing more consistent VC for erect posture.  Goal Status: PROGRESSING  ASSESSMENT :  CLINICAL IMPRESSION:   Treatment continued today with focus on standing activities. Patient performed well overall- limited only by fatigue. Patient was able to demonstrate improving overall posture with decreased verbal cues. He was also able to demonstrate 20+ reps of sit to stand without any posterior lean today. He was fatigued after prolonged standing and some dynamic reaching.  Patient will benefit from continued skilled physical therapy to further progress his strength and balance to decrease fall risk, improve QOL, and attain therapy goals.    OBJECTIVE IMPAIRMENTS Abnormal gait, decreased activity tolerance, decreased balance, decreased coordination, decreased endurance, decreased mobility, difficulty walking,  decreased ROM, decreased strength, improper body mechanics, postural dysfunction, and obesity.   ACTIVITY LIMITATIONS carrying, lifting, bending, sitting, standing, squatting, stairs, transfers, bed mobility, continence, bathing, toileting, dressing, hygiene/grooming, and locomotion level  PARTICIPATION LIMITATIONS: meal prep, cleaning, laundry, interpersonal relationship, driving, shopping, community activity, and yard work  PERSONAL FACTORS Fitness, Past/current experiences, Time since onset of injury/illness/exacerbation, and 3+ comorbidities: hx of brain tumor, past CVA, HTN, obesity, learning disability, DMII are also affecting patient's functional outcome.   REHAB POTENTIAL: Fair    CLINICAL DECISION MAKING: Stable/uncomplicated  EVALUATION COMPLEXITY: Low  PLAN: PT FREQUENCY: 1-2x/week  PT DURATION: 12 weeks  PLANNED INTERVENTIONS: Therapeutic exercises, Therapeutic activity, Neuromuscular re-education, Balance training, Gait training, Patient/Family education, Joint mobilization, Stair training, Vestibular training, Orthotic/Fit training, DME instructions, Dry Needling, Electrical stimulation, Wheelchair mobility training, Cryotherapy, Moist heat, Compression bandaging, Manual therapy, and Re-evaluation  PLAN FOR NEXT SESSION:  -Continue with functional strengthening exercises of the LE's  -Gait activities using RW. Incorporate proper technique with all transfers to decrease his risk of falling.     Reyes LOISE London, PT Physical Therapist - [place]  [facility]  2:02 PM 10/21/23  [facility] at [facility] [address] [place], [place], [address] Phone: [phone]   Fax:  [phone] 2:02 PM, 10/21/23

## 2023-10-23 ENCOUNTER — Ambulatory Visit: Payer: 59

## 2023-10-23 DIAGNOSIS — R262 Difficulty in walking, not elsewhere classified: Secondary | ICD-10-CM

## 2023-10-23 DIAGNOSIS — R531 Weakness: Secondary | ICD-10-CM

## 2023-10-23 DIAGNOSIS — R278 Other lack of coordination: Secondary | ICD-10-CM

## 2023-10-23 DIAGNOSIS — R296 Repeated falls: Secondary | ICD-10-CM

## 2023-10-23 DIAGNOSIS — M6281 Muscle weakness (generalized): Secondary | ICD-10-CM

## 2023-10-23 DIAGNOSIS — R2681 Unsteadiness on feet: Secondary | ICD-10-CM

## 2023-10-23 DIAGNOSIS — R269 Unspecified abnormalities of gait and mobility: Secondary | ICD-10-CM

## 2023-10-23 DIAGNOSIS — R2689 Other abnormalities of gait and mobility: Secondary | ICD-10-CM

## 2023-10-23 NOTE — Therapy (Signed)
 OUTPATIENT PHYSICAL THERAPY TREATMENT Patient Name: Curtis Powell MRN: [id number] DOB:Apr 30, 1965, 58 y.o., male Today's Date: 10/23/2023  PCP: Sherial Bail, MD  REFERRING PROVIDER: Sherial Bail, MD    PT End of Session - 10/23/23 1345     Visit Number 95    Number of Visits 109    Date for PT Re-Evaluation 12/02/23    Progress Note Due on Visit 100    PT Start Time 1314    PT Stop Time 1351    PT Time Calculation (min) 37 min    Equipment Utilized During Treatment Gait belt    Activity Tolerance Patient tolerated treatment well;No increased pain    Behavior During Therapy WFL for tasks assessed/performed           Past Medical History:  Diagnosis Date   Asthma    GERD (gastroesophageal reflux disease)    Hyperlipidemia    Hypertension    Obesity    Stroke [facility])    Past Surgical History:  Procedure Laterality Date   BRAIN SURGERY     Patient Active Problem List   Diagnosis Date Noted   Impaired glucose tolerance 03/23/2020   Learning disability 03/23/2020   Urinary and fecal incontinence 06/14/2019   Chronic pain syndrome 03/23/2019   Constipation 03/23/2019   Dysarthria 03/23/2019   Dysphagia 03/23/2019   Learning difficulty 03/23/2019   Morbid obesity (HCC) 03/23/2019   Muscle weakness 03/23/2019   Vitamin D deficiency 03/23/2019   Bilateral carotid artery stenosis 12/15/2018   Moderate aortic valve stenosis 12/15/2018   BMI 45.0-49.9, adult (HCC) 11/10/2018   Cerebrovascular accident (HCC) 05/02/2016   Hemiparesis affecting right side as late effect of cerebrovascular accident (HCC) 05/01/2016   Brain tumor (HCC) 11/24/2014   Gastro-esophageal reflux disease without esophagitis 11/24/2014   Accident due to mechanical fall without injury 08/03/2014   Essential hypertension 06/13/2014   Mixed hyperlipidemia 06/13/2014   Unspecified sequelae of cerebral infarction 06/13/2014   Thyromegaly 01/07/2014   Type 2 diabetes mellitus without  complication (HCC) 01/06/2014   Neck mass 09/08/2013   Swimmer's ear 09/08/2013   Erectile dysfunction 01/03/2013   Prediabetes 01/03/2013   ONSET DATE: 05/09/2016  REFERRING DIAG: P30.648 (ICD-10-CM) - Hemiplegia and hemiparesis following cerebral infarction affecting right dominant side   THERAPY DIAG:  Difficulty in walking, not elsewhere classified  Unsteadiness on feet  Muscle weakness (generalized)  Other abnormalities of gait and mobility  Abnormality of gait and mobility  Other lack of coordination  Repeated falls  Left-sided weakness   Rationale for Evaluation and Treatment Rehabilitation  SUBJECTIVE:  SUBJECTIVE STATEMENT Patient reports He had a fall in home last week - stumbled over his feet and fell forward - no injuries reported.    Pt accompanied by: self   PERTINENT HISTORY:  The pt is a pleasant 58 yo male who returns to PT for evaluation of deficits s/o old CVA. Pt with hx of CVA Feb 2018. He was previously fully independent, but CVA caused significant deficits, mainly affecting his right side. Pt reports continued difficulty with walking and transfers. Pt still using WC for majority of mobility, otherwise he uses his RW. Pt lives with his mom who is his main caregiver. Patient had outpatient PT previously in clinic in 2023. He reports since discharge he has been completing his HEP and walking about every day. Pt would like to improve his walking mainly, but also would like to work on balance. He also reports difficulty with transfers. Pt's mom and his caregiver helps him with ADLs. However, he says he is now waiting for a new caregiver. Prior to this pt had caregiver 3x/week. PMH significant for hx of brain tumor, past CVA, DMII and HTN. See chart for additional  information.  PAIN:  Are you having pain? Pt reports no pain currently  PRECAUTIONS: fall  WEIGHT BEARING RESTRICTIONS none  PLOF: required assistance with ADLs (old CVA), prior to CVA in 2018 was indep.  History of Falls: Pt reports no recent falls  Home information: Pt lives with mother who is his primary caregiver. Previously had home aide, 3x/week but reports currently waiting for new one.  PATIENT GOALS: Pt would like to improve his ability to ambulate.  OBJECTIVE:  taken at [facility] unless otherwise noted:   DIAGNOSTIC FINDINGS: No recent imaging per chart  Objective measures taken at eval unless otherwise specified:   Posture: forward head posture, rounded shoulders  MMT LE: grossly  4+/5, exception hip flexors and abductors 4/5 each bilat   Functional tests:  5xSTS:  34 sec, use of BUE (multiple initial attempts with posterior LOB in chair)   : 0.096 m/s with RW, close CGA, and WC follow  Gait for distance (goal 150 ft): deferred due to time  Gait mechanics: impaired, decreased speed (see ), crouched posture, poor postural stability and relies heavily on BUE support on RW. Pt now with bilat AFOS   FOTO: 44 (goal 50)   TODAY'S TREATMENT 10/23/23     Therapeutic Activities: dynamic therapeutic activities  designed to achieve improved functional performance  Ambulation in clinic using bariatric RW, CGA with close w/c follow x 208 feet with only standing rest breaks- Intermittent cues for erect posture and to increase step length  Therex:   -seated hip march 2 x 10 alt LE -Seated knee ext 2 x 10 reps alt LE -Ham curls 2 x 10 alt LE    PATIENT EDUCATION: Education details: If pt can lean pelvis into support surface, his energy efficicency will be higher.  Person educated: Patient Education method: Explanation, Demonstration, Tactile cues, and Verbal cues Education comprehension: verbalized understanding, returned demonstration, and verbal cues  required  HOME EXERCISE PROGRAM:  Standing hip flexor stretch at counter, prolonged standing with erect posture for gluteal activation, HS curls with RTB  Access Code: [email or web address] URL: [email or web address] Date: 05/15/2023 Prepared by: Connell Kiss  Exercises - Seated Long Arc Quad with Ankle Weight  - 1 x daily - 7 x weekly - 2 sets - 10 reps  GOALS: Goals reviewed with patient? No  SHORT TERM GOALS: Target date:  11/12/2022  Pt will be independent with HEP in order to improve strength and balance in order to decrease fall risk and improve function at home. Baseline: instructed in HEP today, to provide printout next visit Goal status: MET  LONG TERM GOALS: Target date: 12/02/2023  Pt will increase FOTO to at least 50 in order to demonstrate improvement in function related to mobility and QoL. Baseline: 44 7/29:48 9/10:40 12/24/2022= 51 Goal status: MET  2.  Pt (< 60 yrs old) will complete 5xSTS test < 30 seconds indicating an increase in LE strength and improved balance. Baseline: 10/01/22 34 sec (multiple initial attempts with posterior LOB in chair) 12/20: 49 seconds 11/04/22:1:06, min A on multiple attempts to maintain balance and pt stays in flexed posture throughout test when coming to standing.   9/10: 27.5 sec, Pushes WC post consistently, close CGA  10/22: 47 seconds first attempt 12/5: 1 minute, 37 seconds, multiple attempts to get first stand, but no difficulty after that; 03/20/2023= 1 min 2 sec with 1UE on walker and 1 on armrest- some posterior lean- Close CGA; 06/17/2023= 38 sec with 1 UE on walker and 1 on armrest; 08/21/2023= 35 sec with 1UE on walker and 1 on armrest.  Goal status: ONGOING  3.  Pt will increase to > 0.5 m/s (30 sec) as to improve gait speed for better home ambulation and reduce fall risk. Baseline: 10/01/22 0.096 m/s with RW, close CGA, and WC follow previously 12/27: 0.11 m/s 11/04/22: .056 m/s  9/10: 076 m/s 10/22: 0.64 m/s  11/26:  .076 m/s; 03/20/2023= 0.08 m/s; 04/22/2023= 0.089 m/s; 05/15/2023 = 0.079 m/s; 05/27/2023= 0.11 m/s; 07/17/2023= 0.15 m/s; 08/21/2023= 0.14 m/s avg with Bariatric walker; 09/09/2023= 0.14 m/s; 10/02/2023= 0.09 m/s with Bariatric walker and Increased cues to swing RLE through vs. Previous sesisons.  Goal status: ONGOING  4.  Pt will increase hip flexor and abductor  gross strength to 4+/5 bilat as to improve functional strength for independent gait, increased standing tolerance, and increased ADL ability. Baseline: 10/01/22 4+/5, exception hip flexors and abductors 4/5 each bilat  (previously 03/27/22: 4+/5 ) 9/10: Hip flexor 4/5, hip abductor 4+/5 10/22: hip flexor 4/5 hip abductor 4+/5 11/26: hip flexor 4+/5 hip abductor 4+/5 Goal status: MET  5.  Pt will ambulate 150 ft with BRW and WC follow with no rest breaks in less than 12 min to allow for improved mobility within home and increased independence. Baseline: 10/03/22 150' with BRW 04/03/22: 105 ft with BRW 7/29: 100 in 9:30 with pediatric RW 9/10:101 ft with BRW stooped posture worsened throughout test 12/24/2022- 150 feet using BRW, CGA with w/c follow- in 10 min 17 sec. Will keep goal to make sure he can do this consistently 10/22: 119 ft with BRW 8 min 23 seconds terminated by patient  11/26: 148 ft with BRW 14 min 34 seconds terminated by patient 03/20/2023= 152 feet using BRW, CGA and w/c follow in 14 min 3 sec- Need to keep goal active to ensure consistency but will revised and add a time element to goal. 04/22/2023= 162 feet in 11 min 54 sec using bariatric RW, CGA and close CGA assist; 05/27/2023- 170 feet without rest break using bariatric RW. 06/17/2023= 160 feet with bariatric 4WW in 9 min- stopped due to fatigue.  Goal status: MET   6. Pt will decrease TUG to below 1 min 30 seconds/decrease in order to demonstrate decreased fall risk. Baseline: 06/17/2023= 2 min using bariatric 4WW; 09/09/2023= 1 min 50  sec with bariatric front wheeled walker.   Goal status: PROGRESSING  7. Pt will ambulate 300 ft with BRW, CGA and WC follow with no rest breaks in less than 20 min to allow for improved mobility within home/community and increased independence. Baseline: 06/17/2023= 160 feet with bariatric 4WW in 9 min- stopped due to fatigue. 07/17/2023- Patient able to walk 270 feet in > 20 min with BRW, CGA and close w/c follow today- VC for posture and for gait sequencing; 08/21/2023- Not specifically testing but patient has been walking 160 feet or more each Visit; 09/09/2023= Patient ambulated 180 feet overall at one time today using bariatric RW, CGA with VC for erect posture and for increased step length. 10/02/2023= 165 feet with Bariatric RW- close w/c follow and CGA. More diffiuclty with gait sequencing more consistent VC for erect posture.  Goal Status: PROGRESSING  ASSESSMENT :  CLINICAL IMPRESSION:   Patient started off moving slow. When asked if anything was wrong he responded No- just concentrating. When asked what he was concentrating on. He responded Doing my best effort today-and he did his best distance in a long time with only standing rest. He reports some bilateral feet soreness upon completion but overall much improved.   Patient will benefit from continued skilled physical therapy to further progress his strength and balance to decrease fall risk, improve QOL, and attain therapy goals.    OBJECTIVE IMPAIRMENTS Abnormal gait, decreased activity tolerance, decreased balance, decreased coordination, decreased endurance, decreased mobility, difficulty walking, decreased ROM, decreased strength, improper body mechanics, postural dysfunction, and obesity.   ACTIVITY LIMITATIONS carrying, lifting, bending, sitting, standing, squatting, stairs, transfers, bed mobility, continence, bathing, toileting, dressing, hygiene/grooming, and locomotion level  PARTICIPATION LIMITATIONS: meal prep, cleaning, laundry, interpersonal relationship, driving,  shopping, community activity, and yard work  PERSONAL FACTORS Fitness, Past/current experiences, Time since onset of injury/illness/exacerbation, and 3+ comorbidities: hx of brain tumor, past CVA, HTN, obesity, learning disability, DMII are also affecting patient's functional outcome.   REHAB POTENTIAL: Fair    CLINICAL DECISION MAKING: Stable/uncomplicated  EVALUATION COMPLEXITY: Low  PLAN: PT FREQUENCY: 1-2x/week  PT DURATION: 12 weeks  PLANNED INTERVENTIONS: Therapeutic exercises, Therapeutic activity, Neuromuscular re-education, Balance training, Gait training, Patient/Family education, Joint mobilization, Stair training, Vestibular training, Orthotic/Fit training, DME instructions, Dry Needling, Electrical stimulation, Wheelchair mobility training, Cryotherapy, Moist heat, Compression bandaging, Manual therapy, and Re-evaluation  PLAN FOR NEXT SESSION:  -Continue with functional strengthening exercises of the LE's  -Gait activities using RW. Incorporate proper technique with all transfers to decrease his risk of falling.     Reyes LOISE London, PT Physical Therapist - [place]  [facility]  2:01 PM 10/23/23  [facility] Out[facility] Rehabilitation at [facility] [address] [place], [place], [address] Phone: [phone]   Fax:  [phone] 2:01 PM, 10/23/23

## 2023-10-28 ENCOUNTER — Encounter: Payer: Self-pay | Admitting: Physical Therapy

## 2023-10-28 ENCOUNTER — Ambulatory Visit: Admitting: Physical Therapy

## 2023-10-28 DIAGNOSIS — I69351 Hemiplegia and hemiparesis following cerebral infarction affecting right dominant side: Secondary | ICD-10-CM

## 2023-10-28 DIAGNOSIS — M6281 Muscle weakness (generalized): Secondary | ICD-10-CM

## 2023-10-28 DIAGNOSIS — R262 Difficulty in walking, not elsewhere classified: Secondary | ICD-10-CM | POA: Diagnosis not present

## 2023-10-28 DIAGNOSIS — R269 Unspecified abnormalities of gait and mobility: Secondary | ICD-10-CM

## 2023-10-28 DIAGNOSIS — R531 Weakness: Secondary | ICD-10-CM

## 2023-10-28 DIAGNOSIS — R2689 Other abnormalities of gait and mobility: Secondary | ICD-10-CM

## 2023-10-28 DIAGNOSIS — R296 Repeated falls: Secondary | ICD-10-CM

## 2023-10-28 DIAGNOSIS — R2681 Unsteadiness on feet: Secondary | ICD-10-CM

## 2023-10-28 NOTE — Therapy (Signed)
 OUTPATIENT PHYSICAL THERAPY TREATMENT Patient Name: Curtis Powell MRN: [id number] DOB:[birth year], 58 y.o., male Today's Date: 10/28/2023  PCP: Sherial Bail, MD  REFERRING PROVIDER: Sherial Bail, MD    PT End of Session - 10/28/23 1306     Visit Number 96    Number of Visits 109    Date for PT Re-Evaluation 12/02/23    Progress Note Due on Visit 100    PT Start Time 1314    PT Stop Time 1355    PT Time Calculation (min) 41 min    Equipment Utilized During Treatment Gait belt    Activity Tolerance Patient tolerated treatment well    Behavior During Therapy WFL for tasks assessed/performed           Past Medical History:  Diagnosis Date   Asthma    GERD (gastroesophageal reflux disease)    Hyperlipidemia    Hypertension    Obesity    Stroke [facility])    Past Surgical History:  Procedure Laterality Date   BRAIN SURGERY     Patient Active Problem List   Diagnosis Date Noted   Impaired glucose tolerance 03/23/2020   Learning disability 03/23/2020   Urinary and fecal incontinence 06/14/2019   Chronic pain syndrome 03/23/2019   Constipation 03/23/2019   Dysarthria 03/23/2019   Dysphagia 03/23/2019   Learning difficulty 03/23/2019   Morbid obesity (HCC) 03/23/2019   Muscle weakness 03/23/2019   Vitamin D deficiency 03/23/2019   Bilateral carotid artery stenosis 12/15/2018   Moderate aortic valve stenosis 12/15/2018   BMI 45.0-49.9, adult (HCC) 11/10/2018   Cerebrovascular accident (HCC) 05/02/2016   Hemiparesis affecting right side as late effect of cerebrovascular accident (HCC) 05/01/2016   Brain tumor (HCC) 11/24/2014   Gastro-esophageal reflux disease without esophagitis 11/24/2014   Accident due to mechanical fall without injury 08/03/2014   Essential hypertension 06/13/2014   Mixed hyperlipidemia 06/13/2014   Unspecified sequelae of cerebral infarction 06/13/2014   Thyromegaly 01/07/2014   Type 2 diabetes mellitus without complication (HCC)  01/06/2014   Neck mass 09/08/2013   Swimmer's ear 09/08/2013   Erectile dysfunction 01/03/2013   Prediabetes 01/03/2013   ONSET DATE: 05/09/2016  REFERRING DIAG: P30.648 (ICD-10-CM) - Hemiplegia and hemiparesis following cerebral infarction affecting right dominant side   THERAPY DIAG:  Difficulty in walking, not elsewhere classified  Unsteadiness on feet  Muscle weakness (generalized)  Other abnormalities of gait and mobility  Abnormality of gait and mobility  Repeated falls  Hemiplegia and hemiparesis following cerebral infarction affecting right dominant side (HCC)  Left-sided weakness   Rationale for Evaluation and Treatment Rehabilitation  SUBJECTIVE:  SUBJECTIVE STATEMENT Patient reports he is doing good, no injuries since his fall last week.   Pt accompanied by: self   PERTINENT HISTORY:  The pt is a pleasant 58 yo male who returns to PT for evaluation of deficits s/o old CVA. Pt with hx of CVA Feb 2018. He was previously fully independent, but CVA caused significant deficits, mainly affecting his right side. Pt reports continued difficulty with walking and transfers. Pt still using WC for majority of mobility, otherwise he uses his RW. Pt lives with his mom who is his main caregiver. Patient had outpatient PT previously in clinic in 2023. He reports since discharge he has been completing his HEP and walking about every day. Pt would like to improve his walking mainly, but also would like to work on balance. He also reports difficulty with transfers. Pt's mom and his caregiver helps him with ADLs. However, he says he is now waiting for a new caregiver. Prior to this pt had caregiver 3x/week. PMH significant for hx of brain tumor, past CVA, DMII and HTN. See chart for additional  information.  PAIN:  Are you having pain? Pt reports no pain currently  PRECAUTIONS: fall  WEIGHT BEARING RESTRICTIONS none  PLOF: required assistance with ADLs (old CVA), prior to CVA in 2018 was indep.  History of Falls: Pt reports no recent falls  Home information: Pt lives with mother who is his primary caregiver. Previously had home aide, 3x/week but reports currently waiting for new one.  PATIENT GOALS: Pt would like to improve his ability to ambulate.  OBJECTIVE:  taken at [facility] unless otherwise noted:   DIAGNOSTIC FINDINGS: No recent imaging per chart  Objective measures taken at eval unless otherwise specified:   Posture: forward head posture, rounded shoulders  MMT LE: grossly  4+/5, exception hip flexors and abductors 4/5 each bilat   Functional tests:  5xSTS:  34 sec, use of BUE (multiple initial attempts with posterior LOB in chair)   : 0.096 m/s with RW, close CGA, and WC follow  Gait for distance (goal 150 ft): deferred due to time  Gait mechanics: impaired, decreased speed (see ), crouched posture, poor postural stability and relies heavily on BUE support on RW. Pt now with bilat AFOS   FOTO: 44 (goal 50)   TODAY'S TREATMENT 10/28/23     Gait Training  Ambulation in clinic using bariatric RW, CGA with close w/c follow Intermittent cues for erect posture and to increase step length - 61 ft  Therex:   -seated hip march 2 x 10 alt LE -GTB Seated hip abduction 2x10  -Seated knee ext 2 x 10 reps alt LE   TA- To improve functional movements patterns for everyday tasks    2x5 STS - MinA-modA 1st set - pt has difficulty c ant weight shift and coming into full extension during stand. B UE support, poor carryover c cues for hand-placement   3x30 Standing at safety bar - Static stand with VC for erect posture and glute activation. B UE support    PATIENT EDUCATION: Education details: If pt can lean pelvis into support surface, his energy  efficicency will be higher.  Person educated: Patient Education method: Explanation, Demonstration, Tactile cues, and Verbal cues Education comprehension: verbalized understanding, returned demonstration, and verbal cues required  HOME EXERCISE PROGRAM:  Standing hip flexor stretch at counter, prolonged standing with erect posture for gluteal activation, HS curls with RTB  Access Code: [email or web address] URL: [email or web address] Date: 05/15/2023 Prepared by: Connell  Pippin  Exercises - Seated Long Arc Quad with Ankle Weight  - 1 x daily - 7 x weekly - 2 sets - 10 reps  GOALS: Goals reviewed with patient? No  SHORT TERM GOALS: Target date: 11/12/2022  Pt will be independent with HEP in order to improve strength and balance in order to decrease fall risk and improve function at home. Baseline: instructed in HEP today, to provide printout next visit Goal status: MET  LONG TERM GOALS: Target date: 12/02/2023  Pt will increase FOTO to at least 50 in order to demonstrate improvement in function related to mobility and QoL. Baseline: 44 7/29:48 9/10:40 12/24/2022= 51 Goal status: MET  2.  Pt (< 60 yrs old) will complete 5xSTS test < 30 seconds indicating an increase in LE strength and improved balance. Baseline: 10/01/22 34 sec (multiple initial attempts with posterior LOB in chair) 12/20: 49 seconds 11/04/22:1:06, min A on multiple attempts to maintain balance and pt stays in flexed posture throughout test when coming to standing.   9/10: 27.5 sec, Pushes WC post consistently, close CGA  10/22: 47 seconds first attempt 12/5: 1 minute, 37 seconds, multiple attempts to get first stand, but no difficulty after that; 03/20/2023= 1 min 2 sec with 1UE on walker and 1 on armrest- some posterior lean- Close CGA; 06/17/2023= 38 sec with 1 UE on walker and 1 on armrest; 08/21/2023= 35 sec with 1UE on walker and 1 on armrest.  Goal status: ONGOING  3.  Pt will increase to > 0.5 m/s (30 sec)  as to improve gait speed for better home ambulation and reduce fall risk. Baseline: 10/01/22 0.096 m/s with RW, close CGA, and WC follow previously 12/27: 0.11 m/s 11/04/22: .056 m/s  9/10: 076 m/s 10/22: 0.64 m/s  11/26: .076 m/s; 03/20/2023= 0.08 m/s; 04/22/2023= 0.089 m/s; 05/15/2023 = 0.079 m/s; 05/27/2023= 0.11 m/s; 07/17/2023= 0.15 m/s; 08/21/2023= 0.14 m/s avg with Bariatric walker; 09/09/2023= 0.14 m/s; 10/02/2023= 0.09 m/s with Bariatric walker and Increased cues to swing RLE through vs. Previous sesisons.  Goal status: ONGOING  4.  Pt will increase hip flexor and abductor  gross strength to 4+/5 bilat as to improve functional strength for independent gait, increased standing tolerance, and increased ADL ability. Baseline: 10/01/22 4+/5, exception hip flexors and abductors 4/5 each bilat  (previously 03/27/22: 4+/5 ) 9/10: Hip flexor 4/5, hip abductor 4+/5 10/22: hip flexor 4/5 hip abductor 4+/5 11/26: hip flexor 4+/5 hip abductor 4+/5 Goal status: MET  5.  Pt will ambulate 150 ft with BRW and WC follow with no rest breaks in less than 12 min to allow for improved mobility within home and increased independence. Baseline: 10/03/22 150' with BRW 04/03/22: 105 ft with BRW 7/29: 100 in 9:30 with pediatric RW 9/10:101 ft with BRW stooped posture worsened throughout test 12/24/2022- 150 feet using BRW, CGA with w/c follow- in 10 min 17 sec. Will keep goal to make sure he can do this consistently 10/22: 119 ft with BRW 8 min 23 seconds terminated by patient  11/26: 148 ft with BRW 14 min 34 seconds terminated by patient 03/20/2023= 152 feet using BRW, CGA and w/c follow in 14 min 3 sec- Need to keep goal active to ensure consistency but will revised and add a time element to goal. 04/22/2023= 162 feet in 11 min 54 sec using bariatric RW, CGA and close CGA assist; 05/27/2023- 170 feet without rest break using bariatric RW. 06/17/2023= 160 feet with bariatric 4WW in 9 min-  stopped due to fatigue.  Goal status:  MET   6. Pt will decrease TUG to below 1 min 30 seconds/decrease in order to demonstrate decreased fall risk. Baseline: 06/17/2023= 2 min using bariatric 4WW; 09/09/2023= 1 min 50 sec with bariatric front wheeled walker.  Goal status: PROGRESSING  7. Pt will ambulate 300 ft with BRW, CGA and WC follow with no rest breaks in less than 20 min to allow for improved mobility within home/community and increased independence. Baseline: 06/17/2023= 160 feet with bariatric 4WW in 9 min- stopped due to fatigue. 07/17/2023- Patient able to walk 270 feet in > 20 min with BRW, CGA and close w/c follow today- VC for posture and for gait sequencing; 08/21/2023- Not specifically testing but patient has been walking 160 feet or more each Visit; 09/09/2023= Patient ambulated 180 feet overall at one time today using bariatric RW, CGA with VC for erect posture and for increased step length. 10/02/2023= 165 feet with Bariatric RW- close w/c follow and CGA. More diffiuclty with gait sequencing more consistent VC for erect posture.  Goal Status: PROGRESSING  ASSESSMENT :  CLINICAL IMPRESSION:   Patient presented to clinic with good motivation ready for PT. Today's session focused on maintaining erect posture during standing and when coming to a sit to stand. Pt displays retropulsion when coming to a stand and has trouble maintaining upright posture without UE support. Pt will continue to benefit from skilled physical therapy intervention to address impairments, improve QOL, and attain therapy goals.    OBJECTIVE IMPAIRMENTS Abnormal gait, decreased activity tolerance, decreased balance, decreased coordination, decreased endurance, decreased mobility, difficulty walking, decreased ROM, decreased strength, improper body mechanics, postural dysfunction, and obesity.   ACTIVITY LIMITATIONS carrying, lifting, bending, sitting, standing, squatting, stairs, transfers, bed mobility, continence, bathing, toileting, dressing,  hygiene/grooming, and locomotion level  PARTICIPATION LIMITATIONS: meal prep, cleaning, laundry, interpersonal relationship, driving, shopping, community activity, and yard work  PERSONAL FACTORS Fitness, Past/current experiences, Time since onset of injury/illness/exacerbation, and 3+ comorbidities: hx of brain tumor, past CVA, HTN, obesity, learning disability, DMII are also affecting patient's functional outcome.   REHAB POTENTIAL: Fair    CLINICAL DECISION MAKING: Stable/uncomplicated  EVALUATION COMPLEXITY: Low  PLAN: PT FREQUENCY: 1-2x/week  PT DURATION: 12 weeks  PLANNED INTERVENTIONS: Therapeutic exercises, Therapeutic activity, Neuromuscular re-education, Balance training, Gait training, Patient/Family education, Joint mobilization, Stair training, Vestibular training, Orthotic/Fit training, DME instructions, Dry Needling, Electrical stimulation, Wheelchair mobility training, Cryotherapy, Moist heat, Compression bandaging, Manual therapy, and Re-evaluation  PLAN FOR NEXT SESSION:  -Continue with functional strengthening exercises of the LE's  -Gait activities using RW. Incorporate proper technique with all transfers to decrease his risk of falling.     Casilda Human, SPT [facility]  1:09 PM 10/28/23  [facility] at [facility] [address] [place], [place], [address] Phone: [phone]   Fax:  [phone] 1:09 PM, 10/28/23

## 2023-10-30 ENCOUNTER — Encounter: Payer: Self-pay | Admitting: Physical Therapy

## 2023-10-30 ENCOUNTER — Ambulatory Visit: Admitting: Physical Therapy

## 2023-10-30 DIAGNOSIS — M6281 Muscle weakness (generalized): Secondary | ICD-10-CM

## 2023-10-30 DIAGNOSIS — R2681 Unsteadiness on feet: Secondary | ICD-10-CM

## 2023-10-30 DIAGNOSIS — R2689 Other abnormalities of gait and mobility: Secondary | ICD-10-CM

## 2023-10-30 DIAGNOSIS — I69351 Hemiplegia and hemiparesis following cerebral infarction affecting right dominant side: Secondary | ICD-10-CM

## 2023-10-30 DIAGNOSIS — R262 Difficulty in walking, not elsewhere classified: Secondary | ICD-10-CM

## 2023-10-30 DIAGNOSIS — R269 Unspecified abnormalities of gait and mobility: Secondary | ICD-10-CM

## 2023-10-30 DIAGNOSIS — R531 Weakness: Secondary | ICD-10-CM

## 2023-10-30 DIAGNOSIS — R296 Repeated falls: Secondary | ICD-10-CM

## 2023-10-30 NOTE — Therapy (Unsigned)
 OUTPATIENT PHYSICAL THERAPY TREATMENT Patient Name: Curtis Powell MRN: [id number] DOB:1966-02-28, 58 y.o., male Today's Date: 10/30/2023  PCP: Sherial Bail, MD  REFERRING PROVIDER: Sherial Bail, MD    PT End of Session - 10/30/23 1259     Visit Number 97    Number of Visits 109    Date for PT Re-Evaluation 12/02/23    Progress Note Due on Visit 100    PT Start Time 1315    PT Stop Time 1400    PT Time Calculation (min) 45 min    Equipment Utilized During Treatment Gait belt    Activity Tolerance Patient tolerated treatment well    Behavior During Therapy WFL for tasks assessed/performed           Past Medical History:  Diagnosis Date   Asthma    GERD (gastroesophageal reflux disease)    Hyperlipidemia    Hypertension    Obesity    Stroke [facility])    Past Surgical History:  Procedure Laterality Date   BRAIN SURGERY     Patient Active Problem List   Diagnosis Date Noted   Impaired glucose tolerance 03/23/2020   Learning disability 03/23/2020   Urinary and fecal incontinence 06/14/2019   Chronic pain syndrome 03/23/2019   Constipation 03/23/2019   Dysarthria 03/23/2019   Dysphagia 03/23/2019   Learning difficulty 03/23/2019   Morbid obesity (HCC) 03/23/2019   Muscle weakness 03/23/2019   Vitamin D deficiency 03/23/2019   Bilateral carotid artery stenosis 12/15/2018   Moderate aortic valve stenosis 12/15/2018   BMI 45.0-49.9, adult (HCC) 11/10/2018   Cerebrovascular accident (HCC) 05/02/2016   Hemiparesis affecting right side as late effect of cerebrovascular accident (HCC) 05/01/2016   Brain tumor (HCC) 11/24/2014   Gastro-esophageal reflux disease without esophagitis 11/24/2014   Accident due to mechanical fall without injury 08/03/2014   Essential hypertension 06/13/2014   Mixed hyperlipidemia 06/13/2014   Unspecified sequelae of cerebral infarction 06/13/2014   Thyromegaly 01/07/2014   Type 2 diabetes mellitus without complication (HCC)  01/06/2014   Neck mass 09/08/2013   Swimmer's ear 09/08/2013   Erectile dysfunction 01/03/2013   Prediabetes 01/03/2013   ONSET DATE: 05/09/2016  REFERRING DIAG: P30.648 (ICD-10-CM) - Hemiplegia and hemiparesis following cerebral infarction affecting right dominant side   THERAPY DIAG:  Difficulty in walking, not elsewhere classified  Unsteadiness on feet  Muscle weakness (generalized)  Abnormality of gait and mobility  Left-sided weakness  Repeated falls  Other abnormalities of gait and mobility  Hemiplegia and hemiparesis following cerebral infarction affecting right dominant side (HCC)   Rationale for Evaluation and Treatment Rehabilitation  SUBJECTIVE:  SUBJECTIVE STATEMENT Patient reports he is doing good, no injuries since Curtis fall last week.   Pt accompanied by: Curtis Powell   PERTINENT HISTORY:  The pt is a pleasant 58 yo male who returns to PT for evaluation of deficits s/o old CVA. Pt with hx of CVA Feb 2018. He was previously fully independent, but CVA caused significant deficits, mainly affecting Curtis right side. Pt reports continued difficulty with walking and transfers. Pt still using WC for majority of mobility, otherwise he uses Curtis RW. Pt lives with Curtis Powell who is Curtis main Powell. Patient had outpatient PT previously in clinic in 2023. He reports since discharge he has been completing Curtis HEP and walking about every day. Pt would like to improve Curtis walking mainly, but also would like to work on balance. He also reports difficulty with transfers. Pt's Powell and Curtis Powell helps him with ADLs. However, he says he is now waiting for a new Powell. Prior to this pt had Powell 3x/week. PMH significant for hx of brain tumor, past CVA, DMII and HTN. See chart for additional  information.  PAIN:  Are you having pain? Pt reports no pain currently  PRECAUTIONS: fall  WEIGHT BEARING RESTRICTIONS none  PLOF: required assistance with ADLs (old CVA), prior to CVA in 2018 was indep.  History of Falls: Pt reports no recent falls  Home information: Pt lives with mother who is Curtis primary Powell. Previously had home aide, 3x/week but reports currently waiting for new one.  PATIENT GOALS: Pt would like to improve Curtis ability to ambulate.  OBJECTIVE:  taken at [facility] unless otherwise noted:   DIAGNOSTIC FINDINGS: No recent imaging per chart  Objective measures taken at eval unless otherwise specified:   Posture: forward head posture, rounded shoulders  MMT LE: grossly  4+/5, exception hip flexors and abductors 4/5 each bilat   Functional tests:  5xSTS:  34 sec, use of BUE (multiple initial attempts with posterior LOB in chair)   : 0.096 m/s with RW, close CGA, and WC follow  Gait for distance (goal 150 ft): deferred due to time  Gait mechanics: impaired, decreased speed (see ), crouched posture, poor postural stability and relies heavily on BUE support on RW. Pt now with bilat AFOS   FOTO: 44 (goal 50)   TODAY'S TREATMENT 10/30/23     Gait Training  Ambulation in clinic using bariatric RW, CGA with close w/c follow Intermittent cues for erect posture and to increase step length - 55 ft followed by seated rest break and another 23 ft. 78 ft total.  Therex:   -seated hip march 2 x 30 alt LE -RTB Seated hip abduction 2x10  -Seated knee ext 2x30  alt LE -Seated thoracic extension w 1/2 foam roller and hands behind head 3x30       TA- To improve functional movements patterns for everyday tasks    2x5 STS - MinA-modA 1st set - pt has difficulty c ant weight shift and coming into full extension during stand. B UE support, poor carryover c cues for hand-placement   8 min Standing at safety bar - Static stand with VC for erect posture  and glute activation. B UE support c intermittent S UE support. Had patient look up at whiteboard and make numbers c magnets to encourage thoracic extension  Unless otherwise stated, CGA was provided and gait belt donned in order to ensure pt safety   PATIENT EDUCATION: Education details: If pt can lean pelvis into support surface,  Curtis energy efficicency will be higher.  Person educated: Patient Education method: Explanation, Demonstration, Tactile cues, and Verbal cues Education comprehension: verbalized understanding, returned demonstration, and verbal cues required  HOME EXERCISE PROGRAM:  Standing hip flexor stretch at counter, prolonged standing with erect posture for gluteal activation, HS curls with RTB  Access Code: [email or web address] URL: [email or web address] Date: 05/15/2023 Prepared by: Connell Kiss  Exercises - Seated Long Arc Quad with Ankle Weight  - 1 x daily - 7 x weekly - 2 sets - 10 reps  GOALS: Goals reviewed with patient? No  SHORT TERM GOALS: Target date: 11/12/2022  Pt will be independent with HEP in order to improve strength and balance in order to decrease fall risk and improve function at home. Baseline: instructed in HEP today, to provide printout next visit Goal status: MET  LONG TERM GOALS: Target date: 12/02/2023  Pt will increase FOTO to at least 50 in order to demonstrate improvement in function related to mobility and QoL. Baseline: 44 7/29:48 9/10:40 12/24/2022= 51 Goal status: MET  2.  Pt (< 60 yrs old) will complete 5xSTS test < 30 seconds indicating an increase in LE strength and improved balance. Baseline: 10/01/22 34 sec (multiple initial attempts with posterior LOB in chair) 12/20: 49 seconds 11/04/22:1:06, min A on multiple attempts to maintain balance and pt stays in flexed posture throughout test when coming to standing.   9/10: 27.5 sec, Pushes WC post consistently, close CGA  10/22: 47 seconds first attempt 12/5: 1 minute, 37  seconds, multiple attempts to get first stand, but no difficulty after that; 03/20/2023= 1 min 2 sec with 1UE on walker and 1 on armrest- some posterior lean- Close CGA; 06/17/2023= 38 sec with 1 UE on walker and 1 on armrest; 08/21/2023= 35 sec with 1UE on walker and 1 on armrest.  Goal status: ONGOING  3.  Pt will increase to > 0.5 m/s (30 sec) as to improve gait speed for better home ambulation and reduce fall risk. Baseline: 10/01/22 0.096 m/s with RW, close CGA, and WC follow previously 12/27: 0.11 m/s 11/04/22: .056 m/s  9/10: 076 m/s 10/22: 0.64 m/s  11/26: .076 m/s; 03/20/2023= 0.08 m/s; 04/22/2023= 0.089 m/s; 05/15/2023 = 0.079 m/s; 05/27/2023= 0.11 m/s; 07/17/2023= 0.15 m/s; 08/21/2023= 0.14 m/s avg with Bariatric walker; 09/09/2023= 0.14 m/s; 10/02/2023= 0.09 m/s with Bariatric walker and Increased cues to swing RLE through vs. Previous sesisons.  Goal status: ONGOING  4.  Pt will increase hip flexor and abductor  gross strength to 4+/5 bilat as to improve functional strength for independent gait, increased standing tolerance, and increased ADL ability. Baseline: 10/01/22 4+/5, exception hip flexors and abductors 4/5 each bilat  (previously 03/27/22: 4+/5 ) 9/10: Hip flexor 4/5, hip abductor 4+/5 10/22: hip flexor 4/5 hip abductor 4+/5 11/26: hip flexor 4+/5 hip abductor 4+/5 Goal status: MET  5.  Pt will ambulate 150 ft with BRW and WC follow with no rest breaks in less than 12 min to allow for improved mobility within home and increased independence. Baseline: 10/03/22 150' with BRW 04/03/22: 105 ft with BRW 7/29: 100 in 9:30 with pediatric RW 9/10:101 ft with BRW stooped posture worsened throughout test 12/24/2022- 150 feet using BRW, CGA with w/c follow- in 10 min 17 sec. Will keep goal to make sure he can do this consistently 10/22: 119 ft with BRW 8 min 23 seconds terminated by patient  11/26: 148 ft with BRW 14 min 34 seconds terminated by patient 03/20/2023= 152 feet  using BRW, CGA and  w/c follow in 14 min 3 sec- Need to keep goal active to ensure consistency but will revised and add a time element to goal. 04/22/2023= 162 feet in 11 min 54 sec using bariatric RW, CGA and close CGA assist; 05/27/2023- 170 feet without rest break using bariatric RW. 06/17/2023= 160 feet with bariatric 4WW in 9 min- stopped due to fatigue.  Goal status: MET   6. Pt will decrease TUG to below 1 min 30 seconds/decrease in order to demonstrate decreased fall risk. Baseline: 06/17/2023= 2 min using bariatric 4WW; 09/09/2023= 1 min 50 sec with bariatric front wheeled walker.  Goal status: PROGRESSING  7. Pt will ambulate 300 ft with BRW, CGA and WC follow with no rest breaks in less than 20 min to allow for improved mobility within home/community and increased independence. Baseline: 06/17/2023= 160 feet with bariatric 4WW in 9 min- stopped due to fatigue. 07/17/2023- Patient able to walk 270 feet in > 20 min with BRW, CGA and close w/c follow today- VC for posture and for gait sequencing; 08/21/2023- Not specifically testing but patient has been walking 160 feet or more each Visit; 09/09/2023= Patient ambulated 180 feet overall at one time today using bariatric RW, CGA with VC for erect posture and for increased step length. 10/02/2023= 165 feet with Bariatric RW- close w/c follow and CGA. More diffiuclty with gait sequencing more consistent VC for erect posture.  Goal Status: PROGRESSING  ASSESSMENT :  CLINICAL IMPRESSION:   Patient presented to clinic with good motivation ready for PT. Today's session focused on maintaining erect posture during standing and when coming to a sit to stand. Pt displays retropulsion when coming to a stand and has trouble maintaining upright posture without UE support. Pt did maintain static stand today for approx 8 min w UE support. Pt will benefit from more interventions to encourage thoracic extension. Pt will continue to benefit from skilled physical therapy intervention to address  impairments, improve QOL, and attain therapy goals.    OBJECTIVE IMPAIRMENTS Abnormal gait, decreased activity tolerance, decreased balance, decreased coordination, decreased endurance, decreased mobility, difficulty walking, decreased ROM, decreased strength, improper body mechanics, postural dysfunction, and obesity.   ACTIVITY LIMITATIONS carrying, lifting, bending, sitting, standing, squatting, stairs, transfers, bed mobility, continence, bathing, toileting, dressing, hygiene/grooming, and locomotion level  PARTICIPATION LIMITATIONS: meal prep, cleaning, laundry, interpersonal relationship, driving, shopping, community activity, and yard work  PERSONAL FACTORS Fitness, Past/current experiences, Time since onset of injury/illness/exacerbation, and 3+ comorbidities: hx of brain tumor, past CVA, HTN, obesity, learning disability, DMII are also affecting patient's functional outcome.   REHAB POTENTIAL: Fair    CLINICAL DECISION MAKING: Stable/uncomplicated  EVALUATION COMPLEXITY: Low  PLAN: PT FREQUENCY: 1-2x/week  PT DURATION: 12 weeks  PLANNED INTERVENTIONS: Therapeutic exercises, Therapeutic activity, Neuromuscular re-education, Balance training, Gait training, Patient/Family education, Joint mobilization, Stair training, Vestibular training, Orthotic/Fit training, DME instructions, Dry Needling, Electrical stimulation, Wheelchair mobility training, Cryotherapy, Moist heat, Compression bandaging, Manual therapy, and Re-evaluation  PLAN FOR NEXT SESSION:  -Continue with functional strengthening exercises of the LE's  -Gait activities using RW. Incorporate proper technique with all transfers to decrease Curtis risk of falling.     Casilda Human, SPT [facility]  1:16 PM 10/30/23  [facility] [place] [address] [place], [place], [address] Phone: [phone]   Fax:  [phone] 1:16 PM, 10/30/23

## 2023-11-04 ENCOUNTER — Ambulatory Visit: Admitting: Physical Therapy

## 2023-11-04 DIAGNOSIS — R262 Difficulty in walking, not elsewhere classified: Secondary | ICD-10-CM

## 2023-11-04 DIAGNOSIS — R278 Other lack of coordination: Secondary | ICD-10-CM

## 2023-11-04 DIAGNOSIS — R2689 Other abnormalities of gait and mobility: Secondary | ICD-10-CM

## 2023-11-04 DIAGNOSIS — R296 Repeated falls: Secondary | ICD-10-CM

## 2023-11-04 DIAGNOSIS — M6281 Muscle weakness (generalized): Secondary | ICD-10-CM

## 2023-11-04 DIAGNOSIS — R269 Unspecified abnormalities of gait and mobility: Secondary | ICD-10-CM

## 2023-11-04 DIAGNOSIS — R2681 Unsteadiness on feet: Secondary | ICD-10-CM

## 2023-11-04 DIAGNOSIS — R531 Weakness: Secondary | ICD-10-CM

## 2023-11-04 DIAGNOSIS — I69351 Hemiplegia and hemiparesis following cerebral infarction affecting right dominant side: Secondary | ICD-10-CM

## 2023-11-04 NOTE — Therapy (Signed)
 OUTPATIENT PHYSICAL THERAPY TREATMENT Patient Name: Curtis Powell MRN: [id number] DOB:1965-04-26, 58 y.o., male Today's Date: 11/04/2023  PCP: Sherial Bail, MD  REFERRING PROVIDER: Sherial Bail, MD    PT End of Session - 11/04/23 1313     Visit Number 98    Number of Visits 109    Date for PT Re-Evaluation 12/02/23    Progress Note Due on Visit 100    PT Start Time 1317    PT Stop Time 1400    PT Time Calculation (min) 43 min    Equipment Utilized During Treatment Gait belt    Activity Tolerance Patient tolerated treatment well    Behavior During Therapy WFL for tasks assessed/performed           Past Medical History:  Diagnosis Date   Asthma    GERD (gastroesophageal reflux disease)    Hyperlipidemia    Hypertension    Obesity    Stroke [facility])    Past Surgical History:  Procedure Laterality Date   BRAIN SURGERY     Patient Active Problem List   Diagnosis Date Noted   Impaired glucose tolerance 03/23/2020   Learning disability 03/23/2020   Urinary and fecal incontinence 06/14/2019   Chronic pain syndrome 03/23/2019   Constipation 03/23/2019   Dysarthria 03/23/2019   Dysphagia 03/23/2019   Learning difficulty 03/23/2019   Morbid obesity (HCC) 03/23/2019   Muscle weakness 03/23/2019   Vitamin D deficiency 03/23/2019   Bilateral carotid artery stenosis 12/15/2018   Moderate aortic valve stenosis 12/15/2018   BMI 45.0-49.9, adult (HCC) 11/10/2018   Cerebrovascular accident (HCC) 05/02/2016   Hemiparesis affecting right side as late effect of cerebrovascular accident (HCC) 05/01/2016   Brain tumor (HCC) 11/24/2014   Gastro-esophageal reflux disease without esophagitis 11/24/2014   Accident due to mechanical fall without injury 08/03/2014   Essential hypertension 06/13/2014   Mixed hyperlipidemia 06/13/2014   Unspecified sequelae of cerebral infarction 06/13/2014   Thyromegaly 01/07/2014   Type 2 diabetes mellitus without complication (HCC)  01/06/2014   Neck mass 09/08/2013   Swimmer's ear 09/08/2013   Erectile dysfunction 01/03/2013   Prediabetes 01/03/2013   ONSET DATE: 05/09/2016  REFERRING DIAG: P30.648 (ICD-10-CM) - Hemiplegia and hemiparesis following cerebral infarction affecting right dominant side   THERAPY DIAG:  Difficulty in walking, not elsewhere classified  Unsteadiness on feet  Muscle weakness (generalized)  Abnormality of gait and mobility  Left-sided weakness  Repeated falls  Other abnormalities of gait and mobility  Hemiplegia and hemiparesis following cerebral infarction affecting right dominant side (HCC)  Other lack of coordination   Rationale for Evaluation and Treatment Rehabilitation  SUBJECTIVE:  SUBJECTIVE STATEMENT  Patient reports he is doing good.  States that he had an uneventful weekend. To reported falls or LOB reported.    Pt accompanied by: self   PERTINENT HISTORY:  The pt is a pleasant 58 yo male who returns to PT for evaluation of deficits s/o old CVA. Pt with hx of CVA Feb 2018. He was previously fully independent, but CVA caused significant deficits, mainly affecting his right side. Pt reports continued difficulty with walking and transfers. Pt still using WC for majority of mobility, otherwise he uses his RW. Pt lives with his mom who is his main caregiver. Patient had outpatient PT previously in clinic in 2023. He reports since discharge he has been completing his HEP and walking about every day. Pt would like to improve his walking mainly, but also would like to work on balance. He also reports difficulty with transfers. Pt's mom and his caregiver helps him with ADLs. However, he says he is now waiting for a new caregiver. Prior to this pt had caregiver 3x/week. PMH significant for hx of  brain tumor, past CVA, DMII and HTN. See chart for additional information.  PAIN:  Are you having pain? Pt reports no pain currently  PRECAUTIONS: fall  WEIGHT BEARING RESTRICTIONS none  PLOF: required assistance with ADLs (old CVA), prior to CVA in 2018 was indep.  History of Falls: Pt reports no recent falls  Home information: Pt lives with mother who is his primary caregiver. Previously had home aide, 3x/week but reports currently waiting for new one.  PATIENT GOALS: Pt would like to improve his ability to ambulate.  OBJECTIVE:  taken at [facility] unless otherwise noted:   DIAGNOSTIC FINDINGS: No recent imaging per chart  Objective measures taken at eval unless otherwise specified:   Posture: forward head posture, rounded shoulders  MMT LE: grossly  4+/5, exception hip flexors and abductors 4/5 each bilat   Functional tests:  5xSTS:  34 sec, use of BUE (multiple initial attempts with posterior LOB in chair)   : 0.096 m/s with RW, close CGA, and WC follow  Gait for distance (goal 150 ft): deferred due to time  Gait mechanics: impaired, decreased speed (see ), crouched posture, poor postural stability and relies heavily on BUE support on RW. Pt now with bilat AFOS   FOTO: 44 (goal 50)   TODAY'S TREATMENT 11/04/23   TA  for improved funcitonal mobility and safety with ADLs.  In parallel bars with BUE support for safety unless otherwise noted.   Sit<>stand in parallel bars with supervision assist x 15 throughout session  Reciprocal march 2x 12 bil  Hip abduction/lateral step 2x 10 bil.  Forward/reverse gait 59ft x 3 and 14ft x 2  Side stepping 35ft 2x 3  Foot tap on airex pad 2x 12 bil with cues for improved hip and knee flexion   Cues from improved posture and increased step length as tolerated in all planes of movement pt demonstrated improvement, but required constant instruction to sustain improved motor pattern.     PATIENT EDUCATION: Education details:  If pt can lean pelvis into support surface, his energy efficicency will be higher.  Person educated: Patient Education method: Explanation, Demonstration, Tactile cues, and Verbal cues Education comprehension: verbalized understanding, returned demonstration, and verbal cues required  HOME EXERCISE PROGRAM:  Standing hip flexor stretch at counter, prolonged standing with erect posture for gluteal activation, HS curls with RTB  Access Code: [email or web address] URL: [email or web address] Date: 05/15/2023 Prepared by: Carly Pippin  Exercises - Seated Long Arc Quad with Ankle Weight  - 1 x daily - 7 x weekly - 2 sets - 10 reps  GOALS: Goals reviewed with patient? No  SHORT TERM GOALS: Target date: 11/12/2022  Pt will be independent with HEP in order to improve strength and balance in order to decrease fall risk and improve function at home. Baseline: instructed in HEP today, to provide printout next visit Goal status: MET  LONG TERM GOALS: Target date: 12/02/2023  Pt will increase FOTO to at least 50 in order to demonstrate improvement in function related to mobility and QoL. Baseline: 44 7/29:48 9/10:40 12/24/2022= 51 Goal status: MET  2.  Pt (< 60 yrs old) will complete 5xSTS test < 30 seconds indicating an increase in LE strength and improved balance. Baseline: 10/01/22 34 sec (multiple initial attempts with posterior LOB in chair) 12/20: 49 seconds 11/04/22:1:06, min A on multiple attempts to maintain balance and pt stays in flexed posture throughout test when coming to standing.   9/10: 27.5 sec, Pushes WC post consistently, close CGA  10/22: 47 seconds first attempt 12/5: 1 minute, 37 seconds, multiple attempts to get first stand, but no difficulty after that; 03/20/2023= 1 min 2 sec with 1UE on walker and 1 on armrest- some posterior lean- Close CGA; 06/17/2023= 38 sec with 1 UE on walker and 1 on armrest; 08/21/2023= 35 sec with 1UE on walker and 1 on armrest.  Goal status:  ONGOING  3.  Pt will increase to > 0.5 m/s (30 sec) as to improve gait speed for better home ambulation and reduce fall risk. Baseline: 10/01/22 0.096 m/s with RW, close CGA, and WC follow previously 12/27: 0.11 m/s 11/04/22: .056 m/s  9/10: 076 m/s 10/22: 0.64 m/s  11/26: .076 m/s; 03/20/2023= 0.08 m/s; 04/22/2023= 0.089 m/s; 05/15/2023 = 0.079 m/s; 05/27/2023= 0.11 m/s; 07/17/2023= 0.15 m/s; 08/21/2023= 0.14 m/s avg with Bariatric walker; 09/09/2023= 0.14 m/s; 10/02/2023= 0.09 m/s with Bariatric walker and Increased cues to swing RLE through vs. Previous sesisons.  Goal status: ONGOING  4.  Pt will increase hip flexor and abductor  gross strength to 4+/5 bilat as to improve functional strength for independent gait, increased standing tolerance, and increased ADL ability. Baseline: 10/01/22 4+/5, exception hip flexors and abductors 4/5 each bilat  (previously 03/27/22: 4+/5 ) 9/10: Hip flexor 4/5, hip abductor 4+/5 10/22: hip flexor 4/5 hip abductor 4+/5 11/26: hip flexor 4+/5 hip abductor 4+/5 Goal status: MET  5.  Pt will ambulate 150 ft with BRW and WC follow with no rest breaks in less than 12 min to allow for improved mobility within home and increased independence. Baseline: 10/03/22 150' with BRW 04/03/22: 105 ft with BRW 7/29: 100 in 9:30 with pediatric RW 9/10:101 ft with BRW stooped posture worsened throughout test 12/24/2022- 150 feet using BRW, CGA with w/c follow- in 10 min 17 sec. Will keep goal to make sure he can do this consistently 10/22: 119 ft with BRW 8 min 23 seconds terminated by patient  11/26: 148 ft with BRW 14 min 34 seconds terminated by patient 03/20/2023= 152 feet using BRW, CGA and w/c follow in 14 min 3 sec- Need to keep goal active to ensure consistency but will revised and add a time element to goal. 04/22/2023= 162 feet in 11 min 54 sec using bariatric RW, CGA and close CGA assist; 05/27/2023- 170 feet without rest break using bariatric RW. 06/17/2023= 160 feet with  bariatric 4WW in 9 min- stopped  due to fatigue.  Goal status: MET   6. Pt will decrease TUG to below 1 min 30 seconds/decrease in order to demonstrate decreased fall risk. Baseline: 06/17/2023= 2 min using bariatric 4WW; 09/09/2023= 1 min 50 sec with bariatric front wheeled walker.  Goal status: PROGRESSING  7. Pt will ambulate 300 ft with BRW, CGA and WC follow with no rest breaks in less than 20 min to allow for improved mobility within home/community and increased independence. Baseline: 06/17/2023= 160 feet with bariatric 4WW in 9 min- stopped due to fatigue. 07/17/2023- Patient able to walk 270 feet in > 20 min with BRW, CGA and close w/c follow today- VC for posture and for gait sequencing; 08/21/2023- Not specifically testing but patient has been walking 160 feet or more each Visit; 09/09/2023= Patient ambulated 180 feet overall at one time today using bariatric RW, CGA with VC for erect posture and for increased step length. 10/02/2023= 165 feet with Bariatric RW- close w/c follow and CGA. More diffiuclty with gait sequencing more consistent VC for erect posture.  Goal Status: PROGRESSING  ASSESSMENT :  CLINICAL IMPRESSION:   Patient presented to clinic with good motivation ready for PT. Today's session focused improved posture and mobility to increase step length and width as tolerated in variable planes of movement. Difficulty with reverse and stepping back from elevated step. Was able to correct movement pattern and posture, but required constant cues to maintain correction in movement patterns.   Pt will continue to benefit from skilled physical therapy intervention to address impairments, improve QOL, and attain therapy goals.    OBJECTIVE IMPAIRMENTS Abnormal gait, decreased activity tolerance, decreased balance, decreased coordination, decreased endurance, decreased mobility, difficulty walking, decreased ROM, decreased strength, improper body mechanics, postural dysfunction, and obesity.    ACTIVITY LIMITATIONS carrying, lifting, bending, sitting, standing, squatting, stairs, transfers, bed mobility, continence, bathing, toileting, dressing, hygiene/grooming, and locomotion level  PARTICIPATION LIMITATIONS: meal prep, cleaning, laundry, interpersonal relationship, driving, shopping, community activity, and yard work  PERSONAL FACTORS Fitness, Past/current experiences, Time since onset of injury/illness/exacerbation, and 3+ comorbidities: hx of brain tumor, past CVA, HTN, obesity, learning disability, DMII are also affecting patient's functional outcome.   REHAB POTENTIAL: Fair    CLINICAL DECISION MAKING: Stable/uncomplicated  EVALUATION COMPLEXITY: Low  PLAN: PT FREQUENCY: 1-2x/week  PT DURATION: 12 weeks  PLANNED INTERVENTIONS: Therapeutic exercises, Therapeutic activity, Neuromuscular re-education, Balance training, Gait training, Patient/Family education, Joint mobilization, Stair training, Vestibular training, Orthotic/Fit training, DME instructions, Dry Needling, Electrical stimulation, Wheelchair mobility training, Cryotherapy, Moist heat, Compression bandaging, Manual therapy, and Re-evaluation  PLAN FOR NEXT SESSION:  -Continue with functional strengthening exercises of the LE's  -Gait activities using RW. Incorporate proper technique with all transfers to decrease his risk of falling.   Massie Dollar PT, DPT  Physical Therapist - [place]  [facility]  1:15 PM 11/04/23

## 2023-11-06 ENCOUNTER — Ambulatory Visit: Admitting: Physical Therapy

## 2023-11-06 DIAGNOSIS — R262 Difficulty in walking, not elsewhere classified: Secondary | ICD-10-CM

## 2023-11-06 DIAGNOSIS — R269 Unspecified abnormalities of gait and mobility: Secondary | ICD-10-CM

## 2023-11-06 DIAGNOSIS — M6281 Muscle weakness (generalized): Secondary | ICD-10-CM

## 2023-11-06 DIAGNOSIS — R531 Weakness: Secondary | ICD-10-CM

## 2023-11-06 DIAGNOSIS — R2681 Unsteadiness on feet: Secondary | ICD-10-CM

## 2023-11-06 NOTE — Therapy (Signed)
 OUTPATIENT PHYSICAL THERAPY TREATMENT Patient Name: Curtis Powell MRN: [id number] DOB:[birth year], 58 y.o., male Today's Date: 11/06/2023  PCP: Sherial Bail, MD  REFERRING PROVIDER: Sherial Bail, MD    PT End of Session - 11/06/23 1333     Visit Number 99    Number of Visits 109    Date for PT Re-Evaluation 12/02/23    Progress Note Due on Visit 100    PT Start Time 1317    PT Stop Time 1357    PT Time Calculation (min) 40 min    Equipment Utilized During Treatment Gait belt    Activity Tolerance Patient tolerated treatment well    Behavior During Therapy WFL for tasks assessed/performed           Past Medical History:  Diagnosis Date   Asthma    GERD (gastroesophageal reflux disease)    Hyperlipidemia    Hypertension    Obesity    Stroke [facility])    Past Surgical History:  Procedure Laterality Date   BRAIN SURGERY     Patient Active Problem List   Diagnosis Date Noted   Impaired glucose tolerance 03/23/2020   Learning disability 03/23/2020   Urinary and fecal incontinence 06/14/2019   Chronic pain syndrome 03/23/2019   Constipation 03/23/2019   Dysarthria 03/23/2019   Dysphagia 03/23/2019   Learning difficulty 03/23/2019   Morbid obesity (HCC) 03/23/2019   Muscle weakness 03/23/2019   Vitamin D deficiency 03/23/2019   Bilateral carotid artery stenosis 12/15/2018   Moderate aortic valve stenosis 12/15/2018   BMI 45.0-49.9, adult (HCC) 11/10/2018   Cerebrovascular accident (HCC) 05/02/2016   Hemiparesis affecting right side as late effect of cerebrovascular accident (HCC) 05/01/2016   Brain tumor (HCC) 11/24/2014   Gastro-esophageal reflux disease without esophagitis 11/24/2014   Accident due to mechanical fall without injury 08/03/2014   Essential hypertension 06/13/2014   Mixed hyperlipidemia 06/13/2014   Unspecified sequelae of cerebral infarction 06/13/2014   Thyromegaly 01/07/2014   Type 2 diabetes mellitus without complication (HCC)  01/06/2014   Neck mass 09/08/2013   Swimmer's ear 09/08/2013   Erectile dysfunction 01/03/2013   Prediabetes 01/03/2013   ONSET DATE: 05/09/2016  REFERRING DIAG: P30.648 (ICD-10-CM) - Hemiplegia and hemiparesis following cerebral infarction affecting right dominant side   THERAPY DIAG:  Difficulty in walking, not elsewhere classified  Unsteadiness on feet  Muscle weakness (generalized)  Abnormality of gait and mobility  Left-sided weakness   Rationale for Evaluation and Treatment Rehabilitation  SUBJECTIVE:  SUBJECTIVE STATEMENT  Patient reports he is doing good.  No changes to report since last session.  Pt accompanied by: self   PERTINENT HISTORY:  The pt is a pleasant 58 yo male who returns to PT for evaluation of deficits s/o old CVA. Pt with hx of CVA Feb 2018. He was previously fully independent, but CVA caused significant deficits, mainly affecting his right side. Pt reports continued difficulty with walking and transfers. Pt still using WC for majority of mobility, otherwise he uses his RW. Pt lives with his mom who is his main caregiver. Patient had outpatient PT previously in clinic in 2023. He reports since discharge he has been completing his HEP and walking about every day. Pt would like to improve his walking mainly, but also would like to work on balance. He also reports difficulty with transfers. Pt's mom and his caregiver helps him with ADLs. However, he says he is now waiting for a new caregiver. Prior to this pt had caregiver 3x/week. PMH significant for hx of brain tumor, past CVA, DMII and HTN. See chart for additional information.  PAIN:  Are you having pain? Pt reports no pain currently  PRECAUTIONS: fall  WEIGHT BEARING RESTRICTIONS none  PLOF: required assistance with  ADLs (old CVA), prior to CVA in 2018 was indep.  History of Falls: Pt reports no recent falls  Home information: Pt lives with mother who is his primary caregiver. Previously had home aide, 3x/week but reports currently waiting for new one.  PATIENT GOALS: Pt would like to improve his ability to ambulate.  OBJECTIVE:  taken at [facility] unless otherwise noted:   DIAGNOSTIC FINDINGS: No recent imaging per chart  Objective measures taken at eval unless otherwise specified:   Posture: forward head posture, rounded shoulders  MMT LE: grossly  4+/5, exception hip flexors and abductors 4/5 each bilat   Functional tests:  5xSTS:  34 sec, use of BUE (multiple initial attempts with posterior LOB in chair)   : 0.096 m/s with RW, close CGA, and WC follow  Gait for distance (goal 150 ft): deferred due to time  Gait mechanics: impaired, decreased speed (see ), crouched posture, poor postural stability and relies heavily on BUE support on RW. Pt now with bilat AFOS   FOTO: 44 (goal 50)   TODAY'S TREATMENT 11/06/23   TA  for improved funcitonal mobility and safety with ADLs.  In parallel bars with BUE support for safety unless otherwise noted.   Sit to stand in // bars 2 x large steps then hold in position for gentle hip flexor stretch, cues for posture 2 x 30 sec ea - normal gait with large steps anterior then retro back across bars x 4 laps  -Large marches x 10 ea LE with cues for amplitude and speed  -large side steps in // bars with UE support x 2 laps, cues for posture  -standing march with UE assist 2 x 5 ea LE, cues for posture and full available ROM  -x 3 sets of the above with rest breaks as needed   Pt required occasional rest breaks due fatigue, PT was attentive to when pt appeared to be tired or winded in order to prevent excessive fatigue.   NMR: To facilitate reeducation of movement, balance, posture, coordination, and/or proprioception/kinesthetic sense. Standing  weight shift and balance practice - normal stance no UE support for the below  X 30 sec normal stance  - x 10 lateral weight shifts   -x 10  ant/ post weight shifts- higher difficulty, poor hip contorl.   X 10 a/ p weight shifts after seated rest   Cues from improved posture and increased step length as tolerated in all planes of movement pt demonstrated improvement, but required constant instruction to sustain improved motor pattern.   Unless otherwise stated, CGA was provided and gait belt donned in order to ensure pt safety   PATIENT EDUCATION: Education details: If pt can lean pelvis into support surface, his energy efficicency will be higher.  Person educated: Patient Education method: Explanation, Demonstration, Tactile cues, and Verbal cues Education comprehension: verbalized understanding, returned demonstration, and verbal cues required  HOME EXERCISE PROGRAM:  Standing hip flexor stretch at counter, prolonged standing with erect posture for gluteal activation, HS curls with RTB  Access Code: [email or web address] URL: [email or web address] Date: 05/15/2023 Prepared by: Connell Kiss  Exercises - Seated Long Arc Quad with Ankle Weight  - 1 x daily - 7 x weekly - 2 sets - 10 reps  GOALS: Goals reviewed with patient? No  SHORT TERM GOALS: Target date: 11/12/2022  Pt will be independent with HEP in order to improve strength and balance in order to decrease fall risk and improve function at home. Baseline: instructed in HEP today, to provide printout next visit Goal status: MET  LONG TERM GOALS: Target date: 12/02/2023  Pt will increase FOTO to at least 50 in order to demonstrate improvement in function related to mobility and QoL. Baseline: 44 7/29:48 9/10:40 12/24/2022= 51 Goal status: MET  2.  Pt (< 60 yrs old) will complete 5xSTS test < 30 seconds indicating an increase in LE strength and improved balance. Baseline: 10/01/22 34 sec (multiple initial attempts with  posterior LOB in chair) 12/20: 49 seconds 11/04/22:1:06, min A on multiple attempts to maintain balance and pt stays in flexed posture throughout test when coming to standing.   9/10: 27.5 sec, Pushes WC post consistently, close CGA  10/22: 47 seconds first attempt 12/5: 1 minute, 37 seconds, multiple attempts to get first stand, but no difficulty after that; 03/20/2023= 1 min 2 sec with 1UE on walker and 1 on armrest- some posterior lean- Close CGA; 06/17/2023= 38 sec with 1 UE on walker and 1 on armrest; 08/21/2023= 35 sec with 1UE on walker and 1 on armrest.  Goal status: ONGOING  3.  Pt will increase to > 0.5 m/s (30 sec) as to improve gait speed for better home ambulation and reduce fall risk. Baseline: 10/01/22 0.096 m/s with RW, close CGA, and WC follow previously 12/27: 0.11 m/s 11/04/22: .056 m/s  9/10: 076 m/s 10/22: 0.64 m/s  11/26: .076 m/s; 03/20/2023= 0.08 m/s; 04/22/2023= 0.089 m/s; 05/15/2023 = 0.079 m/s; 05/27/2023= 0.11 m/s; 07/17/2023= 0.15 m/s; 08/21/2023= 0.14 m/s avg with Bariatric walker; 09/09/2023= 0.14 m/s; 10/02/2023= 0.09 m/s with Bariatric walker and Increased cues to swing RLE through vs. Previous sesisons.  Goal status: ONGOING  4.  Pt will increase hip flexor and abductor  gross strength to 4+/5 bilat as to improve functional strength for independent gait, increased standing tolerance, and increased ADL ability. Baseline: 10/01/22 4+/5, exception hip flexors and abductors 4/5 each bilat  (previously 03/27/22: 4+/5 ) 9/10: Hip flexor 4/5, hip abductor 4+/5 10/22: hip flexor 4/5 hip abductor 4+/5 11/26: hip flexor 4+/5 hip abductor 4+/5 Goal status: MET  5.  Pt will ambulate 150 ft with BRW and WC follow with no rest breaks in less than 12 min to allow for improved mobility within home and  increased independence. Baseline: 10/03/22 150' with BRW 04/03/22: 105 ft with BRW 7/29: 100 in 9:30 with pediatric RW 9/10:101 ft with BRW stooped posture worsened throughout  test 12/24/2022- 150 feet using BRW, CGA with w/c follow- in 10 min 17 sec. Will keep goal to make sure he can do this consistently 10/22: 119 ft with BRW 8 min 23 seconds terminated by patient  11/26: 148 ft with BRW 14 min 34 seconds terminated by patient 03/20/2023= 152 feet using BRW, CGA and w/c follow in 14 min 3 sec- Need to keep goal active to ensure consistency but will revised and add a time element to goal. 04/22/2023= 162 feet in 11 min 54 sec using bariatric RW, CGA and close CGA assist; 05/27/2023- 170 feet without rest break using bariatric RW. 06/17/2023= 160 feet with bariatric 4WW in 9 min- stopped due to fatigue.  Goal status: MET   6. Pt will decrease TUG to below 1 min 30 seconds/decrease in order to demonstrate decreased fall risk. Baseline: 06/17/2023= 2 min using bariatric 4WW; 09/09/2023= 1 min 50 sec with bariatric front wheeled walker.  Goal status: PROGRESSING  7. Pt will ambulate 300 ft with BRW, CGA and WC follow with no rest breaks in less than 20 min to allow for improved mobility within home/community and increased independence. Baseline: 06/17/2023= 160 feet with bariatric 4WW in 9 min- stopped due to fatigue. 07/17/2023- Patient able to walk 270 feet in > 20 min with BRW, CGA and close w/c follow today- VC for posture and for gait sequencing; 08/21/2023- Not specifically testing but patient has been walking 160 feet or more each Visit; 09/09/2023= Patient ambulated 180 feet overall at one time today using bariatric RW, CGA with VC for erect posture and for increased step length. 10/02/2023= 165 feet with Bariatric RW- close w/c follow and CGA. More diffiuclty with gait sequencing more consistent VC for erect posture.  Goal Status: PROGRESSING  ASSESSMENT :  CLINICAL IMPRESSION:    Patient presents with good motivation for completion of physical therapy activities.  Therapy focused on postural cues in order to allow for more efficient gait and standing ability.  Patient has  very low tolerance for anterior to posterior weight shift without concurrent loss of balance.  Worked on this in the parallel bars today could still use continue practice.  Patient also has difficulty with consistent step length and step clearance she is also targeted through interventions this date. Pt will continue to benefit from skilled physical therapy intervention to address impairments, improve QOL, and attain therapy goals.    OBJECTIVE IMPAIRMENTS Abnormal gait, decreased activity tolerance, decreased balance, decreased coordination, decreased endurance, decreased mobility, difficulty walking, decreased ROM, decreased strength, improper body mechanics, postural dysfunction, and obesity.   ACTIVITY LIMITATIONS carrying, lifting, bending, sitting, standing, squatting, stairs, transfers, bed mobility, continence, bathing, toileting, dressing, hygiene/grooming, and locomotion level  PARTICIPATION LIMITATIONS: meal prep, cleaning, laundry, interpersonal relationship, driving, shopping, community activity, and yard work  PERSONAL FACTORS Fitness, Past/current experiences, Time since onset of injury/illness/exacerbation, and 3+ comorbidities: hx of brain tumor, past CVA, HTN, obesity, learning disability, DMII are also affecting patient's functional outcome.   REHAB POTENTIAL: Fair    CLINICAL DECISION MAKING: Stable/uncomplicated  EVALUATION COMPLEXITY: Low  PLAN: PT FREQUENCY: 1-2x/week  PT DURATION: 12 weeks  PLANNED INTERVENTIONS: Therapeutic exercises, Therapeutic activity, Neuromuscular re-education, Balance training, Gait training, Patient/Family education, Joint mobilization, Stair training, Vestibular training, Orthotic/Fit training, DME instructions, Dry Needling, Electrical stimulation, Wheelchair mobility training, Cryotherapy, Moist heat,  Compression bandaging, Manual therapy, and Re-evaluation  PLAN FOR NEXT SESSION:  -Continue with functional strengthening exercises of the  LE's  -Gait activities using RW. Incorporate proper technique with all transfers to decrease his risk of falling.  Note: Portions of this document were prepared using Dragon voice recognition software and although reviewed may contain unintentional dictation errors in syntax, grammar, or spelling.  Lonni KATHEE Gainer PT ,DPT Physical Therapist- [place]  [facility]   1:34 PM 11/06/23

## 2023-11-11 ENCOUNTER — Ambulatory Visit: Attending: Internal Medicine | Admitting: Physical Therapy

## 2023-11-11 DIAGNOSIS — R2681 Unsteadiness on feet: Secondary | ICD-10-CM | POA: Diagnosis present

## 2023-11-11 DIAGNOSIS — R2689 Other abnormalities of gait and mobility: Secondary | ICD-10-CM | POA: Insufficient documentation

## 2023-11-11 DIAGNOSIS — I69351 Hemiplegia and hemiparesis following cerebral infarction affecting right dominant side: Secondary | ICD-10-CM | POA: Insufficient documentation

## 2023-11-11 DIAGNOSIS — R262 Difficulty in walking, not elsewhere classified: Secondary | ICD-10-CM | POA: Insufficient documentation

## 2023-11-11 DIAGNOSIS — R269 Unspecified abnormalities of gait and mobility: Secondary | ICD-10-CM | POA: Insufficient documentation

## 2023-11-11 DIAGNOSIS — R296 Repeated falls: Secondary | ICD-10-CM | POA: Diagnosis present

## 2023-11-11 DIAGNOSIS — M6281 Muscle weakness (generalized): Secondary | ICD-10-CM | POA: Diagnosis present

## 2023-11-11 DIAGNOSIS — R531 Weakness: Secondary | ICD-10-CM | POA: Insufficient documentation

## 2023-11-11 DIAGNOSIS — R278 Other lack of coordination: Secondary | ICD-10-CM | POA: Diagnosis present

## 2023-11-11 DIAGNOSIS — I639 Cerebral infarction, unspecified: Secondary | ICD-10-CM | POA: Diagnosis present

## 2023-11-11 NOTE — Therapy (Signed)
 OUTPATIENT PHYSICAL THERAPY TREATMENT/ Physical Therapy Progress Note   Dates of reporting period  10/02/23   to   11/11/23  Patient Name: Curtis Powell MRN: [id number] DOB:05-25-1965, 58 y.o., male Today's Date: 11/11/2023  PCP: Sherial Bail, MD  REFERRING PROVIDER: Sherial Bail, MD    PT End of Session - 11/11/23 1428     Visit Number 100    Number of Visits 109    Date for PT Re-Evaluation 12/02/23    Progress Note Due on Visit 110    PT Start Time 1315    PT Stop Time 1358    PT Time Calculation (min) 43 min            Past Medical History:  Diagnosis Date   Asthma    GERD (gastroesophageal reflux disease)    Hyperlipidemia    Hypertension    Obesity    Stroke [facility])    Past Surgical History:  Procedure Laterality Date   BRAIN SURGERY     Patient Active Problem List   Diagnosis Date Noted   Impaired glucose tolerance 03/23/2020   Learning disability 03/23/2020   Urinary and fecal incontinence 06/14/2019   Chronic pain syndrome 03/23/2019   Constipation 03/23/2019   Dysarthria 03/23/2019   Dysphagia 03/23/2019   Learning difficulty 03/23/2019   Morbid obesity (HCC) 03/23/2019   Muscle weakness 03/23/2019   Vitamin D deficiency 03/23/2019   Bilateral carotid artery stenosis 12/15/2018   Moderate aortic valve stenosis 12/15/2018   BMI 45.0-49.9, adult (HCC) 11/10/2018   Cerebrovascular accident (HCC) 05/02/2016   Hemiparesis affecting right side as late effect of cerebrovascular accident (HCC) 05/01/2016   Brain tumor (HCC) 11/24/2014   Gastro-esophageal reflux disease without esophagitis 11/24/2014   Accident due to mechanical fall without injury 08/03/2014   Essential hypertension 06/13/2014   Mixed hyperlipidemia 06/13/2014   Unspecified sequelae of cerebral infarction 06/13/2014   Thyromegaly 01/07/2014   Type 2 diabetes mellitus without complication (HCC) 01/06/2014   Neck mass 09/08/2013   Swimmer's ear 09/08/2013   Erectile  dysfunction 01/03/2013   Prediabetes 01/03/2013   ONSET DATE: 05/09/2016  REFERRING DIAG: P30.648 (ICD-10-CM) - Hemiplegia and hemiparesis following cerebral infarction affecting right dominant side   THERAPY DIAG:  No diagnosis found.   Rationale for Evaluation and Treatment Rehabilitation  SUBJECTIVE:                                                                                                                                                                                             SUBJECTIVE STATEMENT  Patient reports he is doing good.  No changes to report  since last session.Says he has been standing at counter and counting to 100 and using bands for LE strength as he is able.   Pt accompanied by: self   PERTINENT HISTORY:  The pt is a pleasant 58 yo male who returns to PT for evaluation of deficits s/o old CVA. Pt with hx of CVA Feb 2018. He was previously fully independent, but CVA caused significant deficits, mainly affecting his right side. Pt reports continued difficulty with walking and transfers. Pt still using WC for majority of mobility, otherwise he uses his RW. Pt lives with his mom who is his main caregiver. Patient had outpatient PT previously in clinic in 2023. He reports since discharge he has been completing his HEP and walking about every day. Pt would like to improve his walking mainly, but also would like to work on balance. He also reports difficulty with transfers. Pt's mom and his caregiver helps him with ADLs. However, he says he is now waiting for a new caregiver. Prior to this pt had caregiver 3x/week. PMH significant for hx of brain tumor, past CVA, DMII and HTN. See chart for additional information.  PAIN:  Are you having pain? Pt reports no pain currently  PRECAUTIONS: fall  WEIGHT BEARING RESTRICTIONS none  PLOF: required assistance with ADLs (old CVA), prior to CVA in 2018 was indep.  History of Falls: Pt reports no recent falls  Home  information: Pt lives with mother who is his primary caregiver. Previously had home aide, 3x/week but reports currently waiting for new one.  PATIENT GOALS: Pt would like to improve his ability to ambulate.  OBJECTIVE:  taken at [facility] unless otherwise noted:   DIAGNOSTIC FINDINGS: No recent imaging per chart  Objective measures taken at eval unless otherwise specified:   Posture: forward head posture, rounded shoulders  MMT LE: grossly  4+/5, exception hip flexors and abductors 4/5 each bilat   Functional tests:  5xSTS:  34 sec, use of BUE (multiple initial attempts with posterior LOB in chair)   : 0.096 m/s with RW, close CGA, and WC follow  Gait for distance (goal 150 ft): deferred due to time  Gait mechanics: impaired, decreased speed (see ), crouched posture, poor postural stability and relies heavily on BUE support on RW. Pt now with bilat AFOS   FOTO: 44 (goal 50)   TODAY'S TREATMENT 11/11/23   TA- To improve functional movements patterns for everyday tasks   STS then ambulation with B RW to armchair ( 20 ft with turns)   Physical Performance Test or Measurement: a  physical performance test(s) or measurement (eg,  musculoskeletal, functional capacity), with written report,  each 15 mins   Five times Sit to Stand Test (FTSS)  TIME: 48 sec with heavy UE support  on walker and arm rest, pt feet move further anterior with some reps then causing poor positioning for subsequent reps  Second attempt after several reps with focus on foot position: 36 sec, still heavy post lean   Cut off scores indicative of increased fall risk: >12 sec CVA, >16 sec PD, >13 sec vestibular (ANPTA Core Set of Outcome Measures for Adults with Neurologic Conditions, 2018)  TA- To improve functional movements patterns for everyday tasks   Gait with RW and close CGA to // bars x 25 ft   Sit to stand in // bars  x large steps then hold in position for gentle hip flexor stretch, cues  for posture and for step length to optimize stretch  x 50 sec ea  Rest STS with heavy UE High knee march to RTB across bars, increased difficulty with R > L, cues for keeping feet in same starting position x 10 each lower extremity Rest STS with heavy UE Another round of same as above x 10 to each lower extremity Rest Sit to stand with heavy UE x 1 lap in // bars sidestepping-cues for posture patient is difficulty with maintaining erect posture causing posterior weight shift and difficulty with activity.  This is predominantly most of his standing posture movements     PATIENT EDUCATION: Education details: If pt can lean pelvis into support surface, his energy efficicency will be higher.  Person educated: Patient Education method: Explanation, Demonstration, Tactile cues, and Verbal cues Education comprehension: verbalized understanding, returned demonstration, and verbal cues required  HOME EXERCISE PROGRAM:  Standing hip flexor stretch at counter, prolonged standing with erect posture for gluteal activation, HS curls with RTB  Access Code: [email or web address] URL: [email or web address] Date: 05/15/2023 Prepared by: Connell Kiss  Exercises - Seated Long Arc Quad with Ankle Weight  - 1 x daily - 7 x weekly - 2 sets - 10 reps  GOALS: Goals reviewed with patient? No  SHORT TERM GOALS: Target date: 11/12/2022  Pt will be independent with HEP in order to improve strength and balance in order to decrease fall risk and improve function at home. Baseline: instructed in HEP today, to provide printout next visit Goal status: MET  LONG TERM GOALS: Target date: 12/02/2023  Pt will increase FOTO to at least 50 in order to demonstrate improvement in function related to mobility and QoL. Baseline: 44 7/29:48 9/10:40 12/24/2022= 51 Goal status: MET  2.  Pt (< 60 yrs old) will complete 5xSTS test < 30 seconds indicating an increase in LE strength and improved balance. Baseline:  10/01/22 34 sec (multiple initial attempts with posterior LOB in chair) 12/20: 49 seconds 11/04/22:1:06, min A on multiple attempts to maintain balance and pt stays in flexed posture throughout test when coming to standing.   9/10: 27.5 sec, Pushes WC post consistently, close CGA  10/22: 47 seconds first attempt 12/5: 1 minute, 37 seconds, multiple attempts to get first stand, but no difficulty after that; 03/20/2023= 1 min 2 sec with 1UE on walker and 1 on armrest- some posterior lean- Close CGA; 06/17/2023= 38 sec with 1 UE on walker and 1 on armrest; 08/21/2023= 35 sec with 1UE on walker and 1 on armrest.  8/5: 36 sec with 1 UE on walker 1 on arm rest  Goal status: ONGOING  3.  Pt will increase to > 0.5 m/s (30 sec) as to improve gait speed for better home ambulation and reduce fall risk. Baseline: 10/01/22 0.096 m/s with RW, close CGA, and WC follow previously 12/27: 0.11 m/s 11/04/22: .056 m/s  9/10: 076 m/s 10/22: 0.64 m/s  11/26: .076 m/s; 03/20/2023= 0.08 m/s; 04/22/2023= 0.089 m/s; 05/15/2023 = 0.079 m/s; 05/27/2023= 0.11 m/s; 07/17/2023= 0.15 m/s; 08/21/2023= 0.14 m/s avg with Bariatric walker; 09/09/2023= 0.14 m/s; 10/02/2023= 0.09 m/s with Bariatric walker and Increased cues to swing RLE through vs. Previous sesisons.  Goal status: ONGOING  4.  Pt will increase hip flexor and abductor  gross strength to 4+/5 bilat as to improve functional strength for independent gait, increased standing tolerance, and increased ADL ability. Baseline: 10/01/22 4+/5, exception hip flexors and abductors 4/5 each bilat  (previously 03/27/22: 4+/5 ) 9/10: Hip flexor 4/5, hip abductor 4+/5 10/22: hip flexor 4/5 hip  abductor 4+/5 11/26: hip flexor 4+/5 hip abductor 4+/5 Goal status: MET  5.  Pt will ambulate 150 ft with BRW and WC follow with no rest breaks in less than 12 min to allow for improved mobility within home and increased independence. Baseline: 10/03/22 150' with BRW 04/03/22: 105 ft with BRW 7/29: 100 in  9:30 with pediatric RW 9/10:101 ft with BRW stooped posture worsened throughout test 12/24/2022- 150 feet using BRW, CGA with w/c follow- in 10 min 17 sec. Will keep goal to make sure he can do this consistently 10/22: 119 ft with BRW 8 min 23 seconds terminated by patient  11/26: 148 ft with BRW 14 min 34 seconds terminated by patient 03/20/2023= 152 feet using BRW, CGA and w/c follow in 14 min 3 sec- Need to keep goal active to ensure consistency but will revised and add a time element to goal. 04/22/2023= 162 feet in 11 min 54 sec using bariatric RW, CGA and close CGA assist; 05/27/2023- 170 feet without rest break using bariatric RW. 06/17/2023= 160 feet with bariatric 4WW in 9 min- stopped due to fatigue.  Goal status: MET   6. Pt will decrease TUG to below 1 min 30 seconds/decrease in order to demonstrate decreased fall risk. Baseline: 06/17/2023= 2 min using bariatric 4WW; 09/09/2023= 1 min 50 sec with bariatric front wheeled walker.  Goal status: PROGRESSING  7. Pt will ambulate 300 ft with BRW, CGA and WC follow with no rest breaks in less than 20 min to allow for improved mobility within home/community and increased independence. Baseline: 06/17/2023= 160 feet with bariatric 4WW in 9 min- stopped due to fatigue. 07/17/2023- Patient able to walk 270 feet in > 20 min with BRW, CGA and close w/c follow today- VC for posture and for gait sequencing; 08/21/2023- Not specifically testing but patient has been walking 160 feet or more each Visit; 09/09/2023= Patient ambulated 180 feet overall at one time today using bariatric RW, CGA with VC for erect posture and for increased step length. 10/02/2023= 165 feet with Bariatric RW- close w/c follow and CGA. More diffiuclty with gait sequencing more consistent VC for erect posture.  Goal Status: PROGRESSING  ASSESSMENT :  CLINICAL IMPRESSION:    Patient presents today for progress note.  Physical therapist assessed 5 times sit to stand and patient shows similar  results to last assessment but still significant improvement from initial evaluation.  Physical therapist defers remainder of progress note activities to patient's primary therapist to be is seeing next session in order to allow consistency and prevent any inter-rater reliability concerns with testing.Patient's condition has the potential to improve in response to therapy. Maximum improvement is yet to be obtained. The anticipated improvement is attainable and reasonable in a generally predictable time.  Pt will continue to benefit from skilled physical therapy intervention to address impairments, improve QOL, and attain therapy goals.     OBJECTIVE IMPAIRMENTS Abnormal gait, decreased activity tolerance, decreased balance, decreased coordination, decreased endurance, decreased mobility, difficulty walking, decreased ROM, decreased strength, improper body mechanics, postural dysfunction, and obesity.   ACTIVITY LIMITATIONS carrying, lifting, bending, sitting, standing, squatting, stairs, transfers, bed mobility, continence, bathing, toileting, dressing, hygiene/grooming, and locomotion level  PARTICIPATION LIMITATIONS: meal prep, cleaning, laundry, interpersonal relationship, driving, shopping, community activity, and yard work  PERSONAL FACTORS Fitness, Past/current experiences, Time since onset of injury/illness/exacerbation, and 3+ comorbidities: hx of brain tumor, past CVA, HTN, obesity, learning disability, DMII are also affecting patient's functional outcome.   REHAB POTENTIAL: Fair  CLINICAL DECISION MAKING: Stable/uncomplicated  EVALUATION COMPLEXITY: Low  PLAN: PT FREQUENCY: 1-2x/week  PT DURATION: 12 weeks  PLANNED INTERVENTIONS: Therapeutic exercises, Therapeutic activity, Neuromuscular re-education, Balance training, Gait training, Patient/Family education, Joint mobilization, Stair training, Vestibular training, Orthotic/Fit training, DME instructions, Dry Needling, Electrical  stimulation, Wheelchair mobility training, Cryotherapy, Moist heat, Compression bandaging, Manual therapy, and Re-evaluation  PLAN FOR NEXT SESSION:  -Continue with functional strengthening exercises of the LE's  -Gait activities using RW. Incorporate proper technique with all transfers to decrease his risk of falling.  Note: Portions of this document were prepared using Dragon voice recognition software and although reviewed may contain unintentional dictation errors in syntax, grammar, or spelling.  Lonni KATHEE Gainer PT ,DPT Physical Therapist- [facility]   2:29 PM 11/11/23

## 2023-11-13 ENCOUNTER — Ambulatory Visit

## 2023-11-13 DIAGNOSIS — R2689 Other abnormalities of gait and mobility: Secondary | ICD-10-CM

## 2023-11-13 DIAGNOSIS — R531 Weakness: Secondary | ICD-10-CM

## 2023-11-13 DIAGNOSIS — R269 Unspecified abnormalities of gait and mobility: Secondary | ICD-10-CM

## 2023-11-13 DIAGNOSIS — R262 Difficulty in walking, not elsewhere classified: Secondary | ICD-10-CM | POA: Diagnosis not present

## 2023-11-13 DIAGNOSIS — R2681 Unsteadiness on feet: Secondary | ICD-10-CM

## 2023-11-13 DIAGNOSIS — M6281 Muscle weakness (generalized): Secondary | ICD-10-CM

## 2023-11-13 DIAGNOSIS — R296 Repeated falls: Secondary | ICD-10-CM

## 2023-11-13 NOTE — Therapy (Unsigned)
 OUTPATIENT PHYSICAL THERAPY TREATMENT/ Physical Therapy Progress Note   Dates of reporting period  10/02/23   to   11/11/23  Patient Name: Curtis Powell MRN: [id number] DOB:1965/05/02, 58 y.o., male Today's Date: 11/13/2023  PCP: Sherial Bail, MD  REFERRING PROVIDER: Sherial Bail, MD    PT End of Session - 11/13/23 1320     Visit Number 101    Number of Visits 109    Date for PT Re-Evaluation 12/02/23    Progress Note Due on Visit 110    PT Start Time 1314    Equipment Utilized During Treatment Gait belt    Activity Tolerance Patient tolerated treatment well    Behavior During Therapy [facility] for tasks assessed/performed            Past Medical History:  Diagnosis Date   Asthma    GERD (gastroesophageal reflux disease)    Hyperlipidemia    Hypertension    Obesity    Stroke [facility])    Past Surgical History:  Procedure Laterality Date   BRAIN SURGERY     Patient Active Problem List   Diagnosis Date Noted   Impaired glucose tolerance 03/23/2020   Learning disability 03/23/2020   Urinary and fecal incontinence 06/14/2019   Chronic pain syndrome 03/23/2019   Constipation 03/23/2019   Dysarthria 03/23/2019   Dysphagia 03/23/2019   Learning difficulty 03/23/2019   Morbid obesity (HCC) 03/23/2019   Muscle weakness 03/23/2019   Vitamin D deficiency 03/23/2019   Bilateral carotid artery stenosis 12/15/2018   Moderate aortic valve stenosis 12/15/2018   BMI 45.0-49.9, adult (HCC) 11/10/2018   Cerebrovascular accident (HCC) 05/02/2016   Hemiparesis affecting right side as late effect of cerebrovascular accident (HCC) 05/01/2016   Brain tumor (HCC) 11/24/2014   Gastro-esophageal reflux disease without esophagitis 11/24/2014   Accident due to mechanical fall without injury 08/03/2014   Essential hypertension 06/13/2014   Mixed hyperlipidemia 06/13/2014   Unspecified sequelae of cerebral infarction 06/13/2014   Thyromegaly 01/07/2014   Type 2 diabetes mellitus  without complication (HCC) 01/06/2014   Neck mass 09/08/2013   Swimmer's ear 09/08/2013   Erectile dysfunction 01/03/2013   Prediabetes 01/03/2013   ONSET DATE: 05/09/2016  REFERRING DIAG: P30.648 (ICD-10-CM) - Hemiplegia and hemiparesis following cerebral infarction affecting right dominant side   THERAPY DIAG:  Difficulty in walking, not elsewhere classified  Unsteadiness on feet  Muscle weakness (generalized)  Abnormality of gait and mobility  Left-sided weakness  Repeated falls  Other abnormalities of gait and mobility   Rationale for Evaluation and Treatment Rehabilitation  SUBJECTIVE:  SUBJECTIVE STATEMENT  Patient reports he is doing good.  No changes to report since last session.Says he has been standing at counter and counting to 100 and using bands for LE strength as he is able.   Pt accompanied by: self   PERTINENT HISTORY:  The pt is a pleasant 58 yo male who returns to PT for evaluation of deficits s/o old CVA. Pt with hx of CVA Feb 2018. He was previously fully independent, but CVA caused significant deficits, mainly affecting his right side. Pt reports continued difficulty with walking and transfers. Pt still using WC for majority of mobility, otherwise he uses his RW. Pt lives with his mom who is his main caregiver. Patient had outpatient PT previously in clinic in 2023. He reports since discharge he has been completing his HEP and walking about every day. Pt would like to improve his walking mainly, but also would like to work on balance. He also reports difficulty with transfers. Pt's mom and his caregiver helps him with ADLs. However, he says he is now waiting for a new caregiver. Prior to this pt had caregiver 3x/week. PMH significant for hx of brain tumor, past CVA, DMII and  HTN. See chart for additional information.  PAIN:  Are you having pain? Pt reports no pain currently  PRECAUTIONS: fall  WEIGHT BEARING RESTRICTIONS none  PLOF: required assistance with ADLs (old CVA), prior to CVA in 2018 was indep.  History of Falls: Pt reports no recent falls  Home information: Pt lives with mother who is his primary caregiver. Previously had home aide, 3x/week but reports currently waiting for new one.  PATIENT GOALS: Pt would like to improve his ability to ambulate.  OBJECTIVE:  taken at [facility] unless otherwise noted:   DIAGNOSTIC FINDINGS: No recent imaging per chart  Objective measures taken at eval unless otherwise specified:   Posture: forward head posture, rounded shoulders  MMT LE: grossly  4+/5, exception hip flexors and abductors 4/5 each bilat   Functional tests:  5xSTS:  34 sec, use of BUE (multiple initial attempts with posterior LOB in chair)   : 0.096 m/s with RW, close CGA, and WC follow  Gait for distance (goal 150 ft): deferred due to time  Gait mechanics: impaired, decreased speed (see ), crouched posture, poor postural stability and relies heavily on BUE support on RW. Pt now with bilat AFOS   FOTO: 44 (goal 50)   TODAY'S TREATMENT 11/13/23   TA- To improve functional movements patterns for everyday tasks   STS then ambulation with B RW  ( 8 ft) then retro 8 feet focusing on taking longer steps  Physical Performance Test or Measurement: a  physical performance test(s) or measurement (eg,  musculoskeletal, functional capacity), with written report,  each 15 mins    PT instructed pt in TUG: 119 sec (average of 3 trials; >13.5 sec indicates increased fall risk)    10 Meter Walk Test: Patient instructed to walk 10 meters (32.8 ft) as quickly and as safely as possible at their normal speed x2 and at a fast speed x2. Time measured from 2 meter mark to 8 meter mark to accommodate ramp-up and ramp-down.  Normal speed 1:  0.11 m/s Normal speed 2: 0.12 m/s Average Normal speed: *** m/s Fast speed 1: *** m/s Fast speed 2: *** m/s Average Fast speed: *** m/s Cut off scores: <0.4 m/s = household Ambulator, 0.4-0.8 m/s = limited community Ambulator, >0.8 m/s = community Ambulator, >1.2 m/s = crossing  a street, <1.0 = increased fall risk MCID 0.05 m/s (small), 0.13 m/s (moderate), 0.06 m/s (significant)  (ANPTA Core Set of Outcome Measures for Adults with Neurologic Conditions, 2018)    PATIENT EDUCATION: Education details: If pt can lean pelvis into support surface, his energy efficicency will be higher.  Person educated: Patient Education method: Explanation, Demonstration, Tactile cues, and Verbal cues Education comprehension: verbalized understanding, returned demonstration, and verbal cues required  HOME EXERCISE PROGRAM:  Standing hip flexor stretch at counter, prolonged standing with erect posture for gluteal activation, HS curls with RTB  Access Code: [email or web address] URL: [email or web address] Date: 05/15/2023 Prepared by: Connell Kiss  Exercises - Seated Long Arc Quad with Ankle Weight  - 1 x daily - 7 x weekly - 2 sets - 10 reps  GOALS: Goals reviewed with patient? No  SHORT TERM GOALS: Target date: 11/12/2022  Pt will be independent with HEP in order to improve strength and balance in order to decrease fall risk and improve function at home. Baseline: instructed in HEP today, to provide printout next visit Goal status: MET  LONG TERM GOALS: Target date: 12/02/2023  Pt will increase FOTO to at least 50 in order to demonstrate improvement in function related to mobility and QoL. Baseline: 44 7/29:48 9/10:40 12/24/2022= 51 Goal status: MET  2.  Pt (< 60 yrs old) will complete 5xSTS test < 30 seconds indicating an increase in LE strength and improved balance. Baseline: 10/01/22 34 sec (multiple initial attempts with posterior LOB in chair) 12/20: 49 seconds 11/04/22:1:06, min A on  multiple attempts to maintain balance and pt stays in flexed posture throughout test when coming to standing.   9/10: 27.5 sec, Pushes WC post consistently, close CGA  10/22: 47 seconds first attempt 12/5: 1 minute, 37 seconds, multiple attempts to get first stand, but no difficulty after that; 03/20/2023= 1 min 2 sec with 1UE on walker and 1 on armrest- some posterior lean- Close CGA; 06/17/2023= 38 sec with 1 UE on walker and 1 on armrest; 08/21/2023= 35 sec with 1UE on walker and 1 on armrest.  8/5: 36 sec with 1 UE on walker 1 on arm rest  Goal status: ONGOING  3.  Pt will increase to > 0.5 m/s (30 sec) as to improve gait speed for better home ambulation and reduce fall risk. Baseline: 10/01/22 0.096 m/s with RW, close CGA, and WC follow previously 12/27: 0.11 m/s 11/04/22: .056 m/s  9/10: 076 m/s 10/22: 0.64 m/s  11/26: .076 m/s; 03/20/2023= 0.08 m/s; 04/22/2023= 0.089 m/s; 05/15/2023 = 0.079 m/s; 05/27/2023= 0.11 m/s; 07/17/2023= 0.15 m/s; 08/21/2023= 0.14 m/s avg with Bariatric walker; 09/09/2023= 0.14 m/s; 10/02/2023= 0.09 m/s with Bariatric walker and Increased cues to swing RLE through vs. Previous sesisons.  Goal status: ONGOING  4.  Pt will increase hip flexor and abductor  gross strength to 4+/5 bilat as to improve functional strength for independent gait, increased standing tolerance, and increased ADL ability. Baseline: 10/01/22 4+/5, exception hip flexors and abductors 4/5 each bilat  (previously 03/27/22: 4+/5 ) 9/10: Hip flexor 4/5, hip abductor 4+/5 10/22: hip flexor 4/5 hip abductor 4+/5 11/26: hip flexor 4+/5 hip abductor 4+/5 Goal status: MET  5.  Pt will ambulate 150 ft with BRW and WC follow with no rest breaks in less than 12 min to allow for improved mobility within home and increased independence. Baseline: 10/03/22 150' with BRW 04/03/22: 105 ft with BRW 7/29: 100 in 9:30 with pediatric RW 9/10:101 ft with BRW  stooped posture worsened throughout test 12/24/2022- 150 feet  using BRW, CGA with w/c follow- in 10 min 17 sec. Will keep goal to make sure he can do this consistently 10/22: 119 ft with BRW 8 min 23 seconds terminated by patient  11/26: 148 ft with BRW 14 min 34 seconds terminated by patient 03/20/2023= 152 feet using BRW, CGA and w/c follow in 14 min 3 sec- Need to keep goal active to ensure consistency but will revised and add a time element to goal. 04/22/2023= 162 feet in 11 min 54 sec using bariatric RW, CGA and close CGA assist; 05/27/2023- 170 feet without rest break using bariatric RW. 06/17/2023= 160 feet with bariatric 4WW in 9 min- stopped due to fatigue.  Goal status: MET   6. Pt will decrease TUG to below 1 min 30 seconds/decrease in order to demonstrate decreased fall risk. Baseline: 06/17/2023= 2 min using bariatric 4WW; 09/09/2023= 1 min 50 sec with bariatric front wheeled walker.  Goal status: PROGRESSING  7. Pt will ambulate 300 ft with BRW, CGA and WC follow with no rest breaks in less than 20 min to allow for improved mobility within home/community and increased independence. Baseline: 06/17/2023= 160 feet with bariatric 4WW in 9 min- stopped due to fatigue. 07/17/2023- Patient able to walk 270 feet in > 20 min with BRW, CGA and close w/c follow today- VC for posture and for gait sequencing; 08/21/2023- Not specifically testing but patient has been walking 160 feet or more each Visit; 09/09/2023= Patient ambulated 180 feet overall at one time today using bariatric RW, CGA with VC for erect posture and for increased step length. 10/02/2023= 165 feet with Bariatric RW- close w/c follow and CGA. More diffiuclty with gait sequencing more consistent VC for erect posture.  Goal Status: PROGRESSING  ASSESSMENT :  CLINICAL IMPRESSION:     Pt will continue to benefit from skilled physical therapy intervention to address impairments, improve QOL, and attain therapy goals.     OBJECTIVE IMPAIRMENTS Abnormal gait, decreased activity tolerance, decreased  balance, decreased coordination, decreased endurance, decreased mobility, difficulty walking, decreased ROM, decreased strength, improper body mechanics, postural dysfunction, and obesity.   ACTIVITY LIMITATIONS carrying, lifting, bending, sitting, standing, squatting, stairs, transfers, bed mobility, continence, bathing, toileting, dressing, hygiene/grooming, and locomotion level  PARTICIPATION LIMITATIONS: meal prep, cleaning, laundry, interpersonal relationship, driving, shopping, community activity, and yard work  PERSONAL FACTORS Fitness, Past/current experiences, Time since onset of injury/illness/exacerbation, and 3+ comorbidities: hx of brain tumor, past CVA, HTN, obesity, learning disability, DMII are also affecting patient's functional outcome.   REHAB POTENTIAL: Fair    CLINICAL DECISION MAKING: Stable/uncomplicated  EVALUATION COMPLEXITY: Low  PLAN: PT FREQUENCY: 1-2x/week  PT DURATION: 12 weeks  PLANNED INTERVENTIONS: Therapeutic exercises, Therapeutic activity, Neuromuscular re-education, Balance training, Gait training, Patient/Family education, Joint mobilization, Stair training, Vestibular training, Orthotic/Fit training, DME instructions, Dry Needling, Electrical stimulation, Wheelchair mobility training, Cryotherapy, Moist heat, Compression bandaging, Manual therapy, and Re-evaluation  PLAN FOR NEXT SESSION:  -Continue with functional strengthening exercises of the LE's  -Gait activities using RW. Incorporate proper technique with all transfers to decrease his risk of falling.   Reyes LOISE London PT  Physical Therapist- [place]  [facility]   1:21 PM 11/13/23

## 2023-11-18 ENCOUNTER — Ambulatory Visit

## 2023-11-18 DIAGNOSIS — R296 Repeated falls: Secondary | ICD-10-CM

## 2023-11-18 DIAGNOSIS — R262 Difficulty in walking, not elsewhere classified: Secondary | ICD-10-CM | POA: Diagnosis not present

## 2023-11-18 DIAGNOSIS — M6281 Muscle weakness (generalized): Secondary | ICD-10-CM

## 2023-11-18 DIAGNOSIS — R531 Weakness: Secondary | ICD-10-CM

## 2023-11-18 DIAGNOSIS — R2681 Unsteadiness on feet: Secondary | ICD-10-CM

## 2023-11-18 DIAGNOSIS — R269 Unspecified abnormalities of gait and mobility: Secondary | ICD-10-CM

## 2023-11-18 NOTE — Therapy (Signed)
 OUTPATIENT PHYSICAL THERAPY TREATMENT  Patient Name: Curtis Powell MRN: [id number] DOB:1965/08/12, 58 y.o., male Today's Date: 11/18/2023  PCP: Sherial Bail, MD  REFERRING PROVIDER: Sherial Bail, MD    PT End of Session - 11/18/23 1259     Visit Number 102    Number of Visits 109    Date for PT Re-Evaluation 12/02/23    Progress Note Due on Visit 110    PT Start Time 1316    PT Stop Time 1358    PT Time Calculation (min) 42 min    Equipment Utilized During Treatment Gait belt    Activity Tolerance Patient tolerated treatment well    Behavior During Therapy WFL for tasks assessed/performed            Past Medical History:  Diagnosis Date   Asthma    GERD (gastroesophageal reflux disease)    Hyperlipidemia    Hypertension    Obesity    Stroke [facility])    Past Surgical History:  Procedure Laterality Date   BRAIN SURGERY     Patient Active Problem List   Diagnosis Date Noted   Impaired glucose tolerance 03/23/2020   Learning disability 03/23/2020   Urinary and fecal incontinence 06/14/2019   Chronic pain syndrome 03/23/2019   Constipation 03/23/2019   Dysarthria 03/23/2019   Dysphagia 03/23/2019   Learning difficulty 03/23/2019   Morbid obesity (HCC) 03/23/2019   Muscle weakness 03/23/2019   Vitamin D deficiency 03/23/2019   Bilateral carotid artery stenosis 12/15/2018   Moderate aortic valve stenosis 12/15/2018   BMI 45.0-49.9, adult (HCC) 11/10/2018   Cerebrovascular accident (HCC) 05/02/2016   Hemiparesis affecting right side as late effect of cerebrovascular accident (HCC) 05/01/2016   Brain tumor (HCC) 11/24/2014   Gastro-esophageal reflux disease without esophagitis 11/24/2014   Accident due to mechanical fall without injury 08/03/2014   Essential hypertension 06/13/2014   Mixed hyperlipidemia 06/13/2014   Unspecified sequelae of cerebral infarction 06/13/2014   Thyromegaly 01/07/2014   Type 2 diabetes mellitus without complication (HCC)  01/06/2014   Neck mass 09/08/2013   Swimmer's ear 09/08/2013   Erectile dysfunction 01/03/2013   Prediabetes 01/03/2013   ONSET DATE: 05/09/2016  REFERRING DIAG: P30.648 (ICD-10-CM) - Hemiplegia and hemiparesis following cerebral infarction affecting right dominant side   THERAPY DIAG:  Difficulty in walking, not elsewhere classified  Unsteadiness on feet  Muscle weakness (generalized)  Abnormality of gait and mobility  Left-sided weakness  Repeated falls   Rationale for Evaluation and Treatment Rehabilitation  SUBJECTIVE:  SUBJECTIVE STATEMENT  Patient reports no changes since last visit - no falls and no pain today.   Pt accompanied by: self   PERTINENT HISTORY:  The pt is a pleasant 58 yo male who returns to PT for evaluation of deficits s/o old CVA. Pt with hx of CVA Feb 2018. He was previously fully independent, but CVA caused significant deficits, mainly affecting his right side. Pt reports continued difficulty with walking and transfers. Pt still using WC for majority of mobility, otherwise he uses his RW. Pt lives with his mom who is his main caregiver. Patient had outpatient PT previously in clinic in 2023. He reports since discharge he has been completing his HEP and walking about every day. Pt would like to improve his walking mainly, but also would like to work on balance. He also reports difficulty with transfers. Pt's mom and his caregiver helps him with ADLs. However, he says he is now waiting for a new caregiver. Prior to this pt had caregiver 3x/week. PMH significant for hx of brain tumor, past CVA, DMII and HTN. See chart for additional information.  PAIN:  Are you having pain? Pt reports no pain currently  PRECAUTIONS: fall  WEIGHT BEARING RESTRICTIONS none  PLOF:  required assistance with ADLs (old CVA), prior to CVA in 2018 was indep.  History of Falls: Pt reports no recent falls  Home information: Pt lives with mother who is his primary caregiver. Previously had home aide, 3x/week but reports currently waiting for new one.  PATIENT GOALS: Pt would like to improve his ability to ambulate.  OBJECTIVE:  taken at [facility] unless otherwise noted:   DIAGNOSTIC FINDINGS: No recent imaging per chart  Objective measures taken at eval unless otherwise specified:   Posture: forward head posture, rounded shoulders  MMT LE: grossly  4+/5, exception hip flexors and abductors 4/5 each bilat   Functional tests:  5xSTS:  34 sec, use of BUE (multiple initial attempts with posterior LOB in chair)   : 0.096 m/s with RW, close CGA, and WC follow  Gait for distance (goal 150 ft): deferred due to time  Gait mechanics: impaired, decreased speed (see ), crouched posture, poor postural stability and relies heavily on BUE support on RW. Pt now with bilat AFOS   FOTO: 44 (goal 50)   TODAY'S TREATMENT 11/18/23   TA- To improve functional movements patterns for everyday tasks   STS at // bars (instructed to push up with 1 UE and pull from bar on other) x 8  Fwd/retro walking in // bars- focusing on increasing step length- counting steps (x 6 trials down and back with 1 rest break) and at best- 16 step back  Attempted to step over orange hurdle but unable to clear foot over hurdle  Step tap onto 1.5 in board with BUE Support x 10 reps x 2 sets (seated rest break) - able to clear feet  Step up onto 1.5 in thick board then off- then 180 deg turn in // bars and step back up and over - repeat x 2  NMR:  -Static stand in // bars without UE Support (VC for anterior weight shift as patient has tendency to posteriorly lean) x , 1 min 45 sec; and 2 min 20 sec.   PATIENT EDUCATION: Education details: If pt can lean pelvis into support surface, his energy  efficicency will be higher.  Person educated: Patient Education method: Explanation, Demonstration, Tactile cues, and Verbal cues Education comprehension: verbalized understanding, returned demonstration,  and verbal cues required  HOME EXERCISE PROGRAM:  Standing hip flexor stretch at counter, prolonged standing with erect posture for gluteal activation, HS curls with RTB  Access Code: [email or web address] URL: [email or web address] Date: 05/15/2023 Prepared by: Connell Kiss  Exercises - Seated Long Arc Quad with Ankle Weight  - 1 x daily - 7 x weekly - 2 sets - 10 reps  GOALS: Goals reviewed with patient? No  SHORT TERM GOALS: Target date: 11/12/2022  Pt will be independent with HEP in order to improve strength and balance in order to decrease fall risk and improve function at home. Baseline: instructed in HEP today, to provide printout next visit Goal status: MET  LONG TERM GOALS: Target date: 12/02/2023  Pt will increase FOTO to at least 50 in order to demonstrate improvement in function related to mobility and QoL. Baseline: 44 7/29:48 9/10:40 12/24/2022= 51 Goal status: MET  2.  Pt (< 60 yrs old) will complete 5xSTS test < 30 seconds indicating an increase in LE strength and improved balance. Baseline: 10/01/22 34 sec (multiple initial attempts with posterior LOB in chair) 12/20: 49 seconds 11/04/22:1:06, min A on multiple attempts to maintain balance and pt stays in flexed posture throughout test when coming to standing.   9/10: 27.5 sec, Pushes WC post consistently, close CGA  10/22: 47 seconds first attempt 12/5: 1 minute, 37 seconds, multiple attempts to get first stand, but no difficulty after that; 03/20/2023= 1 min 2 sec with 1UE on walker and 1 on armrest- some posterior lean- Close CGA; 06/17/2023= 38 sec with 1 UE on walker and 1 on armrest; 08/21/2023= 35 sec with 1UE on walker and 1 on armrest.  8/5: 36 sec with 1 UE on walker 1 on arm rest  Goal status:  ONGOING  3.  Pt will increase to > 0.5 m/s (30 sec) as to improve gait speed for better home ambulation and reduce fall risk. Baseline: 10/01/22 0.096 m/s with RW, close CGA, and WC follow previously 12/27: 0.11 m/s 11/04/22: .056 m/s  9/10: 076 m/s 10/22: 0.64 m/s  11/26: .076 m/s; 03/20/2023= 0.08 m/s; 04/22/2023= 0.089 m/s; 05/15/2023 = 0.079 m/s; 05/27/2023= 0.11 m/s; 07/17/2023= 0.15 m/s; 08/21/2023= 0.14 m/s avg with Bariatric walker; 09/09/2023= 0.14 m/s; 10/02/2023= 0.09 m/s with Bariatric walker and Increased cues to swing RLE through vs. Previous sesisons. 11/13/2023= 0.12 m/s with Bariatric walker Goal status: ONGOING  4.  Pt will increase hip flexor and abductor  gross strength to 4+/5 bilat as to improve functional strength for independent gait, increased standing tolerance, and increased ADL ability. Baseline: 10/01/22 4+/5, exception hip flexors and abductors 4/5 each bilat  (previously 03/27/22: 4+/5 ) 9/10: Hip flexor 4/5, hip abductor 4+/5 10/22: hip flexor 4/5 hip abductor 4+/5 11/26: hip flexor 4+/5 hip abductor 4+/5 Goal status: MET  5.  Pt will ambulate 150 ft with BRW and WC follow with no rest breaks in less than 12 min to allow for improved mobility within home and increased independence. Baseline: 10/03/22 150' with BRW 04/03/22: 105 ft with BRW 7/29: 100 in 9:30 with pediatric RW 9/10:101 ft with BRW stooped posture worsened throughout test 12/24/2022- 150 feet using BRW, CGA with w/c follow- in 10 min 17 sec. Will keep goal to make sure he can do this consistently 10/22: 119 ft with BRW 8 min 23 seconds terminated by patient  11/26: 148 ft with BRW 14 min 34 seconds terminated by patient 03/20/2023= 152 feet using BRW, CGA and w/c follow in  14 min 3 sec- Need to keep goal active to ensure consistency but will revised and add a time element to goal. 04/22/2023= 162 feet in 11 min 54 sec using bariatric RW, CGA and close CGA assist; 05/27/2023- 170 feet without rest break using  bariatric RW. 06/17/2023= 160 feet with bariatric 4WW in 9 min- stopped due to fatigue.  Goal status: MET   6. Pt will decrease TUG to below 1 min 30 seconds/decrease in order to demonstrate decreased fall risk. Baseline: 06/17/2023= 2 min using bariatric 4WW; 09/09/2023= 1 min 50 sec with bariatric front wheeled walker.  Goal status: PROGRESSING  7. Pt will ambulate 300 ft with BRW, CGA and WC follow with no rest breaks in less than 20 min to allow for improved mobility within home/community and increased independence. Baseline: 06/17/2023= 160 feet with bariatric 4WW in 9 min- stopped due to fatigue. 07/17/2023- Patient able to walk 270 feet in > 20 min with BRW, CGA and close w/c follow today- VC for posture and for gait sequencing; 08/21/2023- Not specifically testing but patient has been walking 160 feet or more each Visit; 09/09/2023= Patient ambulated 180 feet overall at one time today using bariatric RW, CGA with VC for erect posture and for increased step length. 10/02/2023= 165 feet with Bariatric RW- close w/c follow and CGA. More diffiuclty with gait sequencing more consistent VC for erect posture.  Goal Status: PROGRESSING  ASSESSMENT :  CLINICAL IMPRESSION:    Patient responded much better overall today - able to advance ea LE in // bars today without hesitation or any sluggish movement. He was not abel to step over orange hurdle due to LE muscle weakness but was able to step tap onto 1.5 in board. He was able to improve his step length both fwd and bwd today and less fatigued than previous visit.  Pt will continue to benefit from skilled physical therapy intervention to address impairments, improve QOL, and attain therapy goals.     OBJECTIVE IMPAIRMENTS Abnormal gait, decreased activity tolerance, decreased balance, decreased coordination, decreased endurance, decreased mobility, difficulty walking, decreased ROM, decreased strength, improper body mechanics, postural dysfunction, and  obesity.   ACTIVITY LIMITATIONS carrying, lifting, bending, sitting, standing, squatting, stairs, transfers, bed mobility, continence, bathing, toileting, dressing, hygiene/grooming, and locomotion level  PARTICIPATION LIMITATIONS: meal prep, cleaning, laundry, interpersonal relationship, driving, shopping, community activity, and yard work  PERSONAL FACTORS Fitness, Past/current experiences, Time since onset of injury/illness/exacerbation, and 3+ comorbidities: hx of brain tumor, past CVA, HTN, obesity, learning disability, DMII are also affecting patient's functional outcome.   REHAB POTENTIAL: Fair    CLINICAL DECISION MAKING: Stable/uncomplicated  EVALUATION COMPLEXITY: Low  PLAN: PT FREQUENCY: 1-2x/week  PT DURATION: 12 weeks  PLANNED INTERVENTIONS: Therapeutic exercises, Therapeutic activity, Neuromuscular re-education, Balance training, Gait training, Patient/Family education, Joint mobilization, Stair training, Vestibular training, Orthotic/Fit training, DME instructions, Dry Needling, Electrical stimulation, Wheelchair mobility training, Cryotherapy, Moist heat, Compression bandaging, Manual therapy, and Re-evaluation  PLAN FOR NEXT SESSION:  -Continue with functional strengthening exercises of the LE's  -Gait activities using RW. Incorporate proper technique with all transfers to decrease his risk of falling.   Reyes LOISE London PT  Physical Therapist- [place]  [facility]   3:42 PM 11/18/23

## 2023-11-20 ENCOUNTER — Ambulatory Visit

## 2023-11-20 DIAGNOSIS — R2681 Unsteadiness on feet: Secondary | ICD-10-CM

## 2023-11-20 DIAGNOSIS — R531 Weakness: Secondary | ICD-10-CM

## 2023-11-20 DIAGNOSIS — R269 Unspecified abnormalities of gait and mobility: Secondary | ICD-10-CM

## 2023-11-20 DIAGNOSIS — R296 Repeated falls: Secondary | ICD-10-CM

## 2023-11-20 DIAGNOSIS — M6281 Muscle weakness (generalized): Secondary | ICD-10-CM

## 2023-11-20 DIAGNOSIS — R262 Difficulty in walking, not elsewhere classified: Secondary | ICD-10-CM

## 2023-11-20 NOTE — Therapy (Signed)
 OUTPATIENT PHYSICAL THERAPY TREATMENT  Patient Name: Curtis Powell MRN: [id number] DOB:Apr 05, 1966, 58 y.o., male Today's Date: 11/21/2023  PCP: Sherial Bail, MD  REFERRING PROVIDER: Sherial Bail, MD    PT End of Session - 11/20/23 1309     Visit Number 103    Number of Visits 109    Date for PT Re-Evaluation 12/02/23    Progress Note Due on Visit 110    PT Start Time 1315    PT Stop Time 1358    PT Time Calculation (min) 43 min    Equipment Utilized During Treatment Gait belt    Activity Tolerance Patient tolerated treatment well    Behavior During Therapy WFL for tasks assessed/performed            Past Medical History:  Diagnosis Date   Asthma    GERD (gastroesophageal reflux disease)    Hyperlipidemia    Hypertension    Obesity    Stroke [facility])    Past Surgical History:  Procedure Laterality Date   BRAIN SURGERY     Patient Active Problem List   Diagnosis Date Noted   Impaired glucose tolerance 03/23/2020   Learning disability 03/23/2020   Urinary and fecal incontinence 06/14/2019   Chronic pain syndrome 03/23/2019   Constipation 03/23/2019   Dysarthria 03/23/2019   Dysphagia 03/23/2019   Learning difficulty 03/23/2019   Morbid obesity (HCC) 03/23/2019   Muscle weakness 03/23/2019   Vitamin D deficiency 03/23/2019   Bilateral carotid artery stenosis 12/15/2018   Moderate aortic valve stenosis 12/15/2018   BMI 45.0-49.9, adult (HCC) 11/10/2018   Cerebrovascular accident (HCC) 05/02/2016   Hemiparesis affecting right side as late effect of cerebrovascular accident (HCC) 05/01/2016   Brain tumor (HCC) 11/24/2014   Gastro-esophageal reflux disease without esophagitis 11/24/2014   Accident due to mechanical fall without injury 08/03/2014   Essential hypertension 06/13/2014   Mixed hyperlipidemia 06/13/2014   Unspecified sequelae of cerebral infarction 06/13/2014   Thyromegaly 01/07/2014   Type 2 diabetes mellitus without complication (HCC)  01/06/2014   Neck mass 09/08/2013   Swimmer's ear 09/08/2013   Erectile dysfunction 01/03/2013   Prediabetes 01/03/2013   ONSET DATE: 05/09/2016  REFERRING DIAG: P30.648 (ICD-10-CM) - Hemiplegia and hemiparesis following cerebral infarction affecting right dominant side   THERAPY DIAG:  Difficulty in walking, not elsewhere classified  Unsteadiness on feet  Muscle weakness (generalized)  Abnormality of gait and mobility  Left-sided weakness  Repeated falls   Rationale for Evaluation and Treatment Rehabilitation  SUBJECTIVE:  SUBJECTIVE STATEMENT  Patient reports no changes since last visit - no falls and no pain today.   Pt accompanied by: self   PERTINENT HISTORY:  The pt is a pleasant 58 yo male who returns to PT for evaluation of deficits s/o old CVA. Pt with hx of CVA Feb 2018. He was previously fully independent, but CVA caused significant deficits, mainly affecting his right side. Pt reports continued difficulty with walking and transfers. Pt still using WC for majority of mobility, otherwise he uses his RW. Pt lives with his mom who is his main caregiver. Patient had outpatient PT previously in clinic in 2023. He reports since discharge he has been completing his HEP and walking about every day. Pt would like to improve his walking mainly, but also would like to work on balance. He also reports difficulty with transfers. Pt's mom and his caregiver helps him with ADLs. However, he says he is now waiting for a new caregiver. Prior to this pt had caregiver 3x/week. PMH significant for hx of brain tumor, past CVA, DMII and HTN. See chart for additional information.  PAIN:  Are you having pain? Pt reports no pain currently  PRECAUTIONS: fall  WEIGHT BEARING RESTRICTIONS none  PLOF:  required assistance with ADLs (old CVA), prior to CVA in 2018 was indep.  History of Falls: Pt reports no recent falls  Home information: Pt lives with mother who is his primary caregiver. Previously had home aide, 3x/week but reports currently waiting for new one.  PATIENT GOALS: Pt would like to improve his ability to ambulate.  OBJECTIVE:  taken at [facility] unless otherwise noted:   DIAGNOSTIC FINDINGS: No recent imaging per chart  Objective measures taken at eval unless otherwise specified:   Posture: forward head posture, rounded shoulders  MMT LE: grossly  4+/5, exception hip flexors and abductors 4/5 each bilat   Functional tests:  5xSTS:  34 sec, use of BUE (multiple initial attempts with posterior LOB in chair)   : 0.096 m/s with RW, close CGA, and WC follow  Gait for distance (goal 150 ft): deferred due to time  Gait mechanics: impaired, decreased speed (see ), crouched posture, poor postural stability and relies heavily on BUE support on RW. Pt now with bilat AFOS   FOTO: 44 (goal 50)   TODAY'S TREATMENT 11/20/23      TE:  Seated hip march Seated knee ext 2 x 10 reps each LE  - VC for height   TA- To improve functional movements patterns for everyday tasks   STS from w/c  (instructed to push up with 1 UE and place other on handle of walker) x 10- Some posterior lean - improved with practice.   Static standing in front of w/c with tray table placed in front of him with deck of cards- patient instructed to separate cards into red and black while standing without UE support - he performed 3 trials of standing to complete this process around 12 min followed by 1/2 round with more trials totaling approx 20 min  Static Standing  in front of w/c with tray table placed in front of him with patient flipping an Hourglass on tray table then to stand as erect - continue standing until hourglass empty- approx 2.5 min + the time it took to stand and get ready- more  like 3 min x 3 trials- On last trial- patient performed his most erect stand   10 meter walking practice= 0.12 m/s with BRW, CGA and  w/c follow   PATIENT EDUCATION: Education details: If pt can lean pelvis into support surface, his energy efficicency will be higher.  Person educated: Patient Education method: Explanation, Demonstration, Tactile cues, and Verbal cues Education comprehension: verbalized understanding, returned demonstration, and verbal cues required  HOME EXERCISE PROGRAM:  Standing hip flexor stretch at counter, prolonged standing with erect posture for gluteal activation, HS curls with RTB  Access Code: [email or web address] URL: [email or web address] Date: 05/15/2023 Prepared by: Connell Kiss  Exercises - Seated Long Arc Quad with Ankle Weight  - 1 x daily - 7 x weekly - 2 sets - 10 reps  GOALS: Goals reviewed with patient? No  SHORT TERM GOALS: Target date: 11/12/2022  Pt will be independent with HEP in order to improve strength and balance in order to decrease fall risk and improve function at home. Baseline: instructed in HEP today, to provide printout next visit Goal status: MET  LONG TERM GOALS: Target date: 12/02/2023  Pt will increase FOTO to at least 50 in order to demonstrate improvement in function related to mobility and QoL. Baseline: 44 7/29:48 9/10:40 12/24/2022= 51 Goal status: MET  2.  Pt (< 60 yrs old) will complete 5xSTS test < 30 seconds indicating an increase in LE strength and improved balance. Baseline: 10/01/22 34 sec (multiple initial attempts with posterior LOB in chair) 12/20: 49 seconds 11/04/22:1:06, min A on multiple attempts to maintain balance and pt stays in flexed posture throughout test when coming to standing.   9/10: 27.5 sec, Pushes WC post consistently, close CGA  10/22: 47 seconds first attempt 12/5: 1 minute, 37 seconds, multiple attempts to get first stand, but no difficulty after that; 03/20/2023= 1 min 2 sec with 1UE  on walker and 1 on armrest- some posterior lean- Close CGA; 06/17/2023= 38 sec with 1 UE on walker and 1 on armrest; 08/21/2023= 35 sec with 1UE on walker and 1 on armrest.  8/5: 36 sec with 1 UE on walker 1 on arm rest  Goal status: ONGOING  3.  Pt will increase to > 0.5 m/s (30 sec) as to improve gait speed for better home ambulation and reduce fall risk. Baseline: 10/01/22 0.096 m/s with RW, close CGA, and WC follow previously 12/27: 0.11 m/s 11/04/22: .056 m/s  9/10: 076 m/s 10/22: 0.64 m/s  11/26: .076 m/s; 03/20/2023= 0.08 m/s; 04/22/2023= 0.089 m/s; 05/15/2023 = 0.079 m/s; 05/27/2023= 0.11 m/s; 07/17/2023= 0.15 m/s; 08/21/2023= 0.14 m/s avg with Bariatric walker; 09/09/2023= 0.14 m/s; 10/02/2023= 0.09 m/s with Bariatric walker and Increased cues to swing RLE through vs. Previous sesisons. 11/13/2023= 0.12 m/s with Bariatric walker Goal status: ONGOING  4.  Pt will increase hip flexor and abductor  gross strength to 4+/5 bilat as to improve functional strength for independent gait, increased standing tolerance, and increased ADL ability. Baseline: 10/01/22 4+/5, exception hip flexors and abductors 4/5 each bilat  (previously 03/27/22: 4+/5 ) 9/10: Hip flexor 4/5, hip abductor 4+/5 10/22: hip flexor 4/5 hip abductor 4+/5 11/26: hip flexor 4+/5 hip abductor 4+/5 Goal status: MET  5.  Pt will ambulate 150 ft with BRW and WC follow with no rest breaks in less than 12 min to allow for improved mobility within home and increased independence. Baseline: 10/03/22 150' with BRW 04/03/22: 105 ft with BRW 7/29: 100 in 9:30 with pediatric RW 9/10:101 ft with BRW stooped posture worsened throughout test 12/24/2022- 150 feet using BRW, CGA with w/c follow- in 10 min 17 sec. Will keep goal to make  sure he can do this consistently 10/22: 119 ft with BRW 8 min 23 seconds terminated by patient  11/26: 148 ft with BRW 14 min 34 seconds terminated by patient 03/20/2023= 152 feet using BRW, CGA and w/c follow in 14 min  3 sec- Need to keep goal active to ensure consistency but will revised and add a time element to goal. 04/22/2023= 162 feet in 11 min 54 sec using bariatric RW, CGA and close CGA assist; 05/27/2023- 170 feet without rest break using bariatric RW. 06/17/2023= 160 feet with bariatric 4WW in 9 min- stopped due to fatigue.  Goal status: MET   6. Pt will decrease TUG to below 1 min 30 seconds/decrease in order to demonstrate decreased fall risk. Baseline: 06/17/2023= 2 min using bariatric 4WW; 09/09/2023= 1 min 50 sec with bariatric front wheeled walker.  Goal status: PROGRESSING  7. Pt will ambulate 300 ft with BRW, CGA and WC follow with no rest breaks in less than 20 min to allow for improved mobility within home/community and increased independence. Baseline: 06/17/2023= 160 feet with bariatric 4WW in 9 min- stopped due to fatigue. 07/17/2023- Patient able to walk 270 feet in > 20 min with BRW, CGA and close w/c follow today- VC for posture and for gait sequencing; 08/21/2023- Not specifically testing but patient has been walking 160 feet or more each Visit; 09/09/2023= Patient ambulated 180 feet overall at one time today using bariatric RW, CGA with VC for erect posture and for increased step length. 10/02/2023= 165 feet with Bariatric RW- close w/c follow and CGA. More diffiuclty with gait sequencing more consistent VC for erect posture.  Goal Status: PROGRESSING  ASSESSMENT :  CLINICAL IMPRESSION:    Patient performed well today- able to practice his standing with both static and dynamic activities. He was able to dual task fairly well - some continued low back/trunk weakness with difficulty standing erect. May try to add bridging and instruct in HEP if successful. He performed 10 meter walk at end of session- he was fatigued yet still had decent time comparing to past testing.  Pt will continue to benefit from skilled physical therapy intervention to address impairments, improve QOL, and attain therapy goals.      OBJECTIVE IMPAIRMENTS Abnormal gait, decreased activity tolerance, decreased balance, decreased coordination, decreased endurance, decreased mobility, difficulty walking, decreased ROM, decreased strength, improper body mechanics, postural dysfunction, and obesity.   ACTIVITY LIMITATIONS carrying, lifting, bending, sitting, standing, squatting, stairs, transfers, bed mobility, continence, bathing, toileting, dressing, hygiene/grooming, and locomotion level  PARTICIPATION LIMITATIONS: meal prep, cleaning, laundry, interpersonal relationship, driving, shopping, community activity, and yard work  PERSONAL FACTORS Fitness, Past/current experiences, Time since onset of injury/illness/exacerbation, and 3+ comorbidities: hx of brain tumor, past CVA, HTN, obesity, learning disability, DMII are also affecting patient's functional outcome.   REHAB POTENTIAL: Fair    CLINICAL DECISION MAKING: Stable/uncomplicated  EVALUATION COMPLEXITY: Low  PLAN: PT FREQUENCY: 1-2x/week  PT DURATION: 12 weeks  PLANNED INTERVENTIONS: Therapeutic exercises, Therapeutic activity, Neuromuscular re-education, Balance training, Gait training, Patient/Family education, Joint mobilization, Stair training, Vestibular training, Orthotic/Fit training, DME instructions, Dry Needling, Electrical stimulation, Wheelchair mobility training, Cryotherapy, Moist heat, Compression bandaging, Manual therapy, and Re-evaluation  PLAN FOR NEXT SESSION:  -Continue with functional strengthening exercises of the LE's  -Gait activities using RW. Incorporate proper technique with all transfers to decrease his risk of falling.   Reyes LOISE London PT  Physical Therapist- [place]  [facility]   9:26 AM 11/21/23

## 2023-11-25 ENCOUNTER — Ambulatory Visit

## 2023-11-25 DIAGNOSIS — R278 Other lack of coordination: Secondary | ICD-10-CM

## 2023-11-25 DIAGNOSIS — R2689 Other abnormalities of gait and mobility: Secondary | ICD-10-CM

## 2023-11-25 DIAGNOSIS — R296 Repeated falls: Secondary | ICD-10-CM

## 2023-11-25 DIAGNOSIS — R262 Difficulty in walking, not elsewhere classified: Secondary | ICD-10-CM | POA: Diagnosis not present

## 2023-11-25 DIAGNOSIS — R531 Weakness: Secondary | ICD-10-CM

## 2023-11-25 DIAGNOSIS — I639 Cerebral infarction, unspecified: Secondary | ICD-10-CM

## 2023-11-25 DIAGNOSIS — R269 Unspecified abnormalities of gait and mobility: Secondary | ICD-10-CM

## 2023-11-25 DIAGNOSIS — R2681 Unsteadiness on feet: Secondary | ICD-10-CM

## 2023-11-25 DIAGNOSIS — M6281 Muscle weakness (generalized): Secondary | ICD-10-CM

## 2023-11-25 DIAGNOSIS — I69351 Hemiplegia and hemiparesis following cerebral infarction affecting right dominant side: Secondary | ICD-10-CM

## 2023-11-25 NOTE — Therapy (Signed)
 OUTPATIENT PHYSICAL THERAPY TREATMENT  Patient Name: Curtis Powell MRN: [id number] DOB:[birth year], 58 y.o., male Today's Date: 11/26/2023  PCP: Sherial Bail, MD  REFERRING PROVIDER: Sherial Bail, MD    PT End of Session - 11/25/23 1256     Visit Number 104    Number of Visits 109    Date for PT Re-Evaluation 12/02/23    Progress Note Due on Visit 110    PT Start Time 1315    PT Stop Time 1358    PT Time Calculation (min) 43 min    Equipment Utilized During Treatment Gait belt    Activity Tolerance Patient tolerated treatment well    Behavior During Therapy WFL for tasks assessed/performed            Past Medical History:  Diagnosis Date   Asthma    GERD (gastroesophageal reflux disease)    Hyperlipidemia    Hypertension    Obesity    Stroke [facility])    Past Surgical History:  Procedure Laterality Date   BRAIN SURGERY     Patient Active Problem List   Diagnosis Date Noted   Impaired glucose tolerance 03/23/2020   Learning disability 03/23/2020   Urinary and fecal incontinence 06/14/2019   Chronic pain syndrome 03/23/2019   Constipation 03/23/2019   Dysarthria 03/23/2019   Dysphagia 03/23/2019   Learning difficulty 03/23/2019   Morbid obesity (HCC) 03/23/2019   Muscle weakness 03/23/2019   Vitamin D deficiency 03/23/2019   Bilateral carotid artery stenosis 12/15/2018   Moderate aortic valve stenosis 12/15/2018   BMI 45.0-49.9, adult (HCC) 11/10/2018   Cerebrovascular accident (HCC) 05/02/2016   Hemiparesis affecting right side as late effect of cerebrovascular accident (HCC) 05/01/2016   Brain tumor (HCC) 11/24/2014   Gastro-esophageal reflux disease without esophagitis 11/24/2014   Accident due to mechanical fall without injury 08/03/2014   Essential hypertension 06/13/2014   Mixed hyperlipidemia 06/13/2014   Unspecified sequelae of cerebral infarction 06/13/2014   Thyromegaly 01/07/2014   Type 2 diabetes mellitus without complication (HCC)  01/06/2014   Neck mass 09/08/2013   Swimmer's ear 09/08/2013   Erectile dysfunction 01/03/2013   Prediabetes 01/03/2013   ONSET DATE: 05/09/2016  REFERRING DIAG: P30.648 (ICD-10-CM) - Hemiplegia and hemiparesis following cerebral infarction affecting right dominant side   THERAPY DIAG:  Difficulty in walking, not elsewhere classified  Unsteadiness on feet  Muscle weakness (generalized)  Abnormality of gait and mobility  Left-sided weakness  Repeated falls  Other abnormalities of gait and mobility  Hemiplegia and hemiparesis following cerebral infarction affecting right dominant side (HCC)  Other lack of coordination  Acute CVA (cerebrovascular accident) (HCC)   Rationale for Evaluation and Treatment Rehabilitation  SUBJECTIVE:  SUBJECTIVE STATEMENT  Patient reports feeling okay today. Denies any pain or falls.   Pt accompanied by: self   PERTINENT HISTORY:  The pt is a pleasant 58 yo male who returns to PT for evaluation of deficits s/o old CVA. Pt with hx of CVA Feb 2018. He was previously fully independent, but CVA caused significant deficits, mainly affecting his right side. Pt reports continued difficulty with walking and transfers. Pt still using WC for majority of mobility, otherwise he uses his RW. Pt lives with his mom who is his main caregiver. Patient had outpatient PT previously in clinic in 2023. He reports since discharge he has been completing his HEP and walking about every day. Pt would like to improve his walking mainly, but also would like to work on balance. He also reports difficulty with transfers. Pt's mom and his caregiver helps him with ADLs. However, he says he is now waiting for a new caregiver. Prior to this pt had caregiver 3x/week. PMH significant for hx of  brain tumor, past CVA, DMII and HTN. See chart for additional information.  PAIN:  Are you having pain? Pt reports no pain currently  PRECAUTIONS: fall  WEIGHT BEARING RESTRICTIONS none  PLOF: required assistance with ADLs (old CVA), prior to CVA in 2018 was indep.  History of Falls: Pt reports no recent falls  Home information: Pt lives with mother who is his primary caregiver. Previously had home aide, 3x/week but reports currently waiting for new one.  PATIENT GOALS: Pt would like to improve his ability to ambulate.  OBJECTIVE:  taken at [facility] unless otherwise noted:   DIAGNOSTIC FINDINGS: No recent imaging per chart  Objective measures taken at eval unless otherwise specified:   Posture: forward head posture, rounded shoulders  MMT LE: grossly  4+/5, exception hip flexors and abductors 4/5 each bilat   Functional tests:  5xSTS:  34 sec, use of BUE (multiple initial attempts with posterior LOB in chair)   : 0.096 m/s with RW, close CGA, and WC follow  Gait for distance (goal 150 ft): deferred due to time  Gait mechanics: impaired, decreased speed (see ), crouched posture, poor postural stability and relies heavily on BUE support on RW. Pt now with bilat AFOS   FOTO: 44 (goal 50)   TODAY'S TREATMENT 11/25/23       TA- To improve functional movements patterns for everyday tasks   STS from w/c  (instructed to push up with 1 UE and place other on handle of walker) x 10- Some posterior lean - improved with practice.   NMR:  Static stand x 3-4 min without UE support (hands hovering over walker) x 2 trials.   GAIT: Ambulation with BRW forward then retro step back to w/c - 12 feet x 2 (increased difficulty with retro steps- increased time)   Ambulation with BRW forward walk down and then side stepping to right on way back (extensive time to complete)   Therex:  Resistive seated lumbar ext (GTB across upper trunk) x 12 reps  Resistive seated lumbar ext  (holding onto 15#KB) x 12 reps    PATIENT EDUCATION: Education details: If pt can lean pelvis into support surface, his energy efficicency will be higher.  Person educated: Patient Education method: Explanation, Demonstration, Tactile cues, and Verbal cues Education comprehension: verbalized understanding, returned demonstration, and verbal cues required  HOME EXERCISE PROGRAM:  Standing hip flexor stretch at counter, prolonged standing with erect posture for gluteal activation, HS curls with RTB  Access Code: [email or web address] URL: [email or web address] Date: 05/15/2023 Prepared by: Connell Kiss  Exercises - Seated Long Arc Quad with Ankle Weight  - 1 x daily - 7 x weekly - 2 sets - 10 reps  GOALS: Goals reviewed with patient? No  SHORT TERM GOALS: Target date: 11/12/2022  Pt will be independent with HEP in order to improve strength and balance in order to decrease fall risk and improve function at home. Baseline: instructed in HEP today, to provide printout next visit Goal status: MET  LONG TERM GOALS: Target date: 12/02/2023  Pt will increase FOTO to at least 50 in order to demonstrate improvement in function related to mobility and QoL. Baseline: 44 7/29:48 9/10:40 12/24/2022= 51 Goal status: MET  2.  Pt (< 60 yrs old) will complete 5xSTS test < 30 seconds indicating an increase in LE strength and improved balance. Baseline: 10/01/22 34 sec (multiple initial attempts with posterior LOB in chair) 12/20: 49 seconds 11/04/22:1:06, min A on multiple attempts to maintain balance and pt stays in flexed posture throughout test when coming to standing.   9/10: 27.5 sec, Pushes WC post consistently, close CGA  10/22: 47 seconds first attempt 12/5: 1 minute, 37 seconds, multiple attempts to get first stand, but no difficulty after that; 03/20/2023= 1 min 2 sec with 1UE on walker and 1 on armrest- some posterior lean- Close CGA; 06/17/2023= 38 sec with 1 UE on walker and 1 on  armrest; 08/21/2023= 35 sec with 1UE on walker and 1 on armrest.  8/5: 36 sec with 1 UE on walker 1 on arm rest  Goal status: ONGOING  3.  Pt will increase to > 0.5 m/s (30 sec) as to improve gait speed for better home ambulation and reduce fall risk. Baseline: 10/01/22 0.096 m/s with RW, close CGA, and WC follow previously 12/27: 0.11 m/s 11/04/22: .056 m/s  9/10: 076 m/s 10/22: 0.64 m/s  11/26: .076 m/s; 03/20/2023= 0.08 m/s; 04/22/2023= 0.089 m/s; 05/15/2023 = 0.079 m/s; 05/27/2023= 0.11 m/s; 07/17/2023= 0.15 m/s; 08/21/2023= 0.14 m/s avg with Bariatric walker; 09/09/2023= 0.14 m/s; 10/02/2023= 0.09 m/s with Bariatric walker and Increased cues to swing RLE through vs. Previous sesisons. 11/13/2023= 0.12 m/s with Bariatric walker Goal status: ONGOING  4.  Pt will increase hip flexor and abductor  gross strength to 4+/5 bilat as to improve functional strength for independent gait, increased standing tolerance, and increased ADL ability. Baseline: 10/01/22 4+/5, exception hip flexors and abductors 4/5 each bilat  (previously 03/27/22: 4+/5 ) 9/10: Hip flexor 4/5, hip abductor 4+/5 10/22: hip flexor 4/5 hip abductor 4+/5 11/26: hip flexor 4+/5 hip abductor 4+/5 Goal status: MET  5.  Pt will ambulate 150 ft with BRW and WC follow with no rest breaks in less than 12 min to allow for improved mobility within home and increased independence. Baseline: 10/03/22 150' with BRW 04/03/22: 105 ft with BRW 7/29: 100 in 9:30 with pediatric RW 9/10:101 ft with BRW stooped posture worsened throughout test 12/24/2022- 150 feet using BRW, CGA with w/c follow- in 10 min 17 sec. Will keep goal to make sure he can do this consistently 10/22: 119 ft with BRW 8 min 23 seconds terminated by patient  11/26: 148 ft with BRW 14 min 34 seconds terminated by patient 03/20/2023= 152 feet using BRW, CGA and w/c follow in 14 min 3 sec- Need to keep goal active to ensure consistency but will revised and add a time element to goal.  04/22/2023= 162 feet in 11  min 54 sec using bariatric RW, CGA and close CGA assist; 05/27/2023- 170 feet without rest break using bariatric RW. 06/17/2023= 160 feet with bariatric 4WW in 9 min- stopped due to fatigue.  Goal status: MET   6. Pt will decrease TUG to below 1 min 30 seconds/decrease in order to demonstrate decreased fall risk. Baseline: 06/17/2023= 2 min using bariatric 4WW; 09/09/2023= 1 min 50 sec with bariatric front wheeled walker.  Goal status: PROGRESSING  7. Pt will ambulate 300 ft with BRW, CGA and WC follow with no rest breaks in less than 20 min to allow for improved mobility within home/community and increased independence. Baseline: 06/17/2023= 160 feet with bariatric 4WW in 9 min- stopped due to fatigue. 07/17/2023- Patient able to walk 270 feet in > 20 min with BRW, CGA and close w/c follow today- VC for posture and for gait sequencing; 08/21/2023- Not specifically testing but patient has been walking 160 feet or more each Visit; 09/09/2023= Patient ambulated 180 feet overall at one time today using bariatric RW, CGA with VC for erect posture and for increased step length. 10/02/2023= 165 feet with Bariatric RW- close w/c follow and CGA. More diffiuclty with gait sequencing more consistent VC for erect posture.  Goal Status: PROGRESSING  ASSESSMENT :  CLINICAL IMPRESSION:   Patient performed well overall- had some difficulty with retro and side stepping requiring considerable time to complete today. He was able to complete with just close CGA and VC's today. He was able to perform some resistive lumbar ext and will try to incorporate some LE bridging next visit and maybe some hip flex stretching to see if can work on his posture better.  Pt will continue to benefit from skilled physical therapy intervention to address impairments, improve QOL, and attain therapy goals.     OBJECTIVE IMPAIRMENTS Abnormal gait, decreased activity tolerance, decreased balance, decreased coordination,  decreased endurance, decreased mobility, difficulty walking, decreased ROM, decreased strength, improper body mechanics, postural dysfunction, and obesity.   ACTIVITY LIMITATIONS carrying, lifting, bending, sitting, standing, squatting, stairs, transfers, bed mobility, continence, bathing, toileting, dressing, hygiene/grooming, and locomotion level  PARTICIPATION LIMITATIONS: meal prep, cleaning, laundry, interpersonal relationship, driving, shopping, community activity, and yard work  PERSONAL FACTORS Fitness, Past/current experiences, Time since onset of injury/illness/exacerbation, and 3+ comorbidities: hx of brain tumor, past CVA, HTN, obesity, learning disability, DMII are also affecting patient's functional outcome.   REHAB POTENTIAL: Fair    CLINICAL DECISION MAKING: Stable/uncomplicated  EVALUATION COMPLEXITY: Low  PLAN: PT FREQUENCY: 1-2x/week  PT DURATION: 12 weeks  PLANNED INTERVENTIONS: Therapeutic exercises, Therapeutic activity, Neuromuscular re-education, Balance training, Gait training, Patient/Family education, Joint mobilization, Stair training, Vestibular training, Orthotic/Fit training, DME instructions, Dry Needling, Electrical stimulation, Wheelchair mobility training, Cryotherapy, Moist heat, Compression bandaging, Manual therapy, and Re-evaluation  PLAN FOR NEXT SESSION:   -Bridging and review of some bed LE strengthening Recert next week - practice TUG -Continue with functional strengthening exercises of the LE's  -Gait activities using RW. Incorporate proper technique with all transfers to decrease his risk of falling.   Reyes LOISE London PT  Physical Therapist- [place]  [facility]   9:17 AM 11/26/23

## 2023-11-27 ENCOUNTER — Ambulatory Visit

## 2023-11-27 DIAGNOSIS — M6281 Muscle weakness (generalized): Secondary | ICD-10-CM

## 2023-11-27 DIAGNOSIS — R269 Unspecified abnormalities of gait and mobility: Secondary | ICD-10-CM

## 2023-11-27 DIAGNOSIS — R2681 Unsteadiness on feet: Secondary | ICD-10-CM

## 2023-11-27 DIAGNOSIS — R262 Difficulty in walking, not elsewhere classified: Secondary | ICD-10-CM

## 2023-11-27 DIAGNOSIS — R296 Repeated falls: Secondary | ICD-10-CM

## 2023-11-27 DIAGNOSIS — R531 Weakness: Secondary | ICD-10-CM

## 2023-11-27 NOTE — Therapy (Signed)
 OUTPATIENT PHYSICAL THERAPY TREATMENT  Patient Name: Curtis Powell MRN: [id number] DOB:10/23/1965, 58 y.o., male Today's Date: 11/27/2023  PCP: Sherial Bail, MD  REFERRING PROVIDER: Sherial Bail, MD    PT End of Session - 11/27/23 1332     Visit Number 105    Number of Visits 109    Date for PT Re-Evaluation 12/02/23    Progress Note Due on Visit 110    PT Start Time 1317    PT Stop Time 1359    PT Time Calculation (min) 42 min    Equipment Utilized During Treatment Gait belt    Activity Tolerance Patient tolerated treatment well    Behavior During Therapy WFL for tasks assessed/performed            Past Medical History:  Diagnosis Date   Asthma    GERD (gastroesophageal reflux disease)    Hyperlipidemia    Hypertension    Obesity    Stroke [facility])    Past Surgical History:  Procedure Laterality Date   BRAIN SURGERY     Patient Active Problem List   Diagnosis Date Noted   Impaired glucose tolerance 03/23/2020   Learning disability 03/23/2020   Urinary and fecal incontinence 06/14/2019   Chronic pain syndrome 03/23/2019   Constipation 03/23/2019   Dysarthria 03/23/2019   Dysphagia 03/23/2019   Learning difficulty 03/23/2019   Morbid obesity (HCC) 03/23/2019   Muscle weakness 03/23/2019   Vitamin D deficiency 03/23/2019   Bilateral carotid artery stenosis 12/15/2018   Moderate aortic valve stenosis 12/15/2018   BMI 45.0-49.9, adult (HCC) 11/10/2018   Cerebrovascular accident (HCC) 05/02/2016   Hemiparesis affecting right side as late effect of cerebrovascular accident (HCC) 05/01/2016   Brain tumor (HCC) 11/24/2014   Gastro-esophageal reflux disease without esophagitis 11/24/2014   Accident due to mechanical fall without injury 08/03/2014   Essential hypertension 06/13/2014   Mixed hyperlipidemia 06/13/2014   Unspecified sequelae of cerebral infarction 06/13/2014   Thyromegaly 01/07/2014   Type 2 diabetes mellitus without complication (HCC)  01/06/2014   Neck mass 09/08/2013   Swimmer's ear 09/08/2013   Erectile dysfunction 01/03/2013   Prediabetes 01/03/2013   ONSET DATE: 05/09/2016  REFERRING DIAG: P30.648 (ICD-10-CM) - Hemiplegia and hemiparesis following cerebral infarction affecting right dominant side   THERAPY DIAG:  Difficulty in walking, not elsewhere classified  Unsteadiness on feet  Muscle weakness (generalized)  Abnormality of gait and mobility  Left-sided weakness  Repeated falls   Rationale for Evaluation and Treatment Rehabilitation  SUBJECTIVE:  SUBJECTIVE STATEMENT  Patient reports feeling okay today. Denies any pain or falls.   Pt accompanied by: self   PERTINENT HISTORY:  The pt is a pleasant 58 yo male who returns to PT for evaluation of deficits s/o old CVA. Pt with hx of CVA Feb 2018. He was previously fully independent, but CVA caused significant deficits, mainly affecting his right side. Pt reports continued difficulty with walking and transfers. Pt still using WC for majority of mobility, otherwise he uses his RW. Pt lives with his mom who is his main caregiver. Patient had outpatient PT previously in clinic in 2023. He reports since discharge he has been completing his HEP and walking about every day. Pt would like to improve his walking mainly, but also would like to work on balance. He also reports difficulty with transfers. Pt's mom and his caregiver helps him with ADLs. However, he says he is now waiting for a new caregiver. Prior to this pt had caregiver 3x/week. PMH significant for hx of brain tumor, past CVA, DMII and HTN. See chart for additional information.  PAIN:  Are you having pain? Pt reports no pain currently  PRECAUTIONS: fall  WEIGHT BEARING RESTRICTIONS none  PLOF: required  assistance with ADLs (old CVA), prior to CVA in 2018 was indep.  History of Falls: Pt reports no recent falls  Home information: Pt lives with mother who is his primary caregiver. Previously had home aide, 3x/week but reports currently waiting for new one.  PATIENT GOALS: Pt would like to improve his ability to ambulate.  OBJECTIVE:  taken at [facility] unless otherwise noted:   DIAGNOSTIC FINDINGS: No recent imaging per chart  Objective measures taken at eval unless otherwise specified:   Posture: forward head posture, rounded shoulders  MMT LE: grossly  4+/5, exception hip flexors and abductors 4/5 each bilat   Functional tests:  5xSTS:  34 sec, use of BUE (multiple initial attempts with posterior LOB in chair)   : 0.096 m/s with RW, close CGA, and WC follow  Gait for distance (goal 150 ft): deferred due to time  Gait mechanics: impaired, decreased speed (see ), crouched posture, poor postural stability and relies heavily on BUE support on RW. Pt now with bilat AFOS   FOTO: 44 (goal 50)   TODAY'S TREATMENT 11/27/23       TA- To improve functional movements patterns for everyday tasks   Bed mobility - Sit to supine- Min assist with BLE lifting up into the bed STS from w/c  (instructed to push up with 1 UE and place other on handle of walker) x 5- Some posterior lean - improved with practice  TE: (supine)  SLS x 12 reps  Hip swing up/over 1/2 spike ball in supine- each LE x 12 reps Heel slides x 12  Bridging with PT assist in holding feet 2 x 10 reps  Hip add with ball squeeze (focusing on R side) hold 3 sec x 2  Manual Leg press (supine) x 12 reps each LE      PATIENT EDUCATION: Education details: If pt can lean pelvis into support surface, his energy efficicency will be higher.  Person educated: Patient Education method: Explanation, Demonstration, Tactile cues, and Verbal cues Education comprehension: verbalized understanding, returned demonstration, and  verbal cues required  HOME EXERCISE PROGRAM:  Standing hip flexor stretch at counter, prolonged standing with erect posture for gluteal activation, HS curls with RTB  Access Code: [email or web address] URL: [email or web address] Date: 05/15/2023 Prepared by: Carly Pippin  Exercises - Seated Long Arc Quad with Ankle Weight  - 1 x daily - 7 x weekly - 2 sets - 10 reps  GOALS: Goals reviewed with patient? No  SHORT TERM GOALS: Target date: 11/12/2022  Pt will be independent with HEP in order to improve strength and balance in order to decrease fall risk and improve function at home. Baseline: instructed in HEP today, to provide printout next visit Goal status: MET  LONG TERM GOALS: Target date: 12/02/2023  Pt will increase FOTO to at least 50 in order to demonstrate improvement in function related to mobility and QoL. Baseline: 44 7/29:48 9/10:40 12/24/2022= 51 Goal status: MET  2.  Pt (< 60 yrs old) will complete 5xSTS test < 30 seconds indicating an increase in LE strength and improved balance. Baseline: 10/01/22 34 sec (multiple initial attempts with posterior LOB in chair) 12/20: 49 seconds 11/04/22:1:06, min A on multiple attempts to maintain balance and pt stays in flexed posture throughout test when coming to standing.   9/10: 27.5 sec, Pushes WC post consistently, close CGA  10/22: 47 seconds first attempt 12/5: 1 minute, 37 seconds, multiple attempts to get first stand, but no difficulty after that; 03/20/2023= 1 min 2 sec with 1UE on walker and 1 on armrest- some posterior lean- Close CGA; 06/17/2023= 38 sec with 1 UE on walker and 1 on armrest; 08/21/2023= 35 sec with 1UE on walker and 1 on armrest.  8/5: 36 sec with 1 UE on walker 1 on arm rest  Goal status: ONGOING  3.  Pt will increase to > 0.5 m/s (30 sec) as to improve gait speed for better home ambulation and reduce fall risk. Baseline: 10/01/22 0.096 m/s with RW, close CGA, and WC follow previously 12/27: 0.11 m/s  11/04/22: .056 m/s  9/10: 076 m/s 10/22: 0.64 m/s  11/26: .076 m/s; 03/20/2023= 0.08 m/s; 04/22/2023= 0.089 m/s; 05/15/2023 = 0.079 m/s; 05/27/2023= 0.11 m/s; 07/17/2023= 0.15 m/s; 08/21/2023= 0.14 m/s avg with Bariatric walker; 09/09/2023= 0.14 m/s; 10/02/2023= 0.09 m/s with Bariatric walker and Increased cues to swing RLE through vs. Previous sesisons. 11/13/2023= 0.12 m/s with Bariatric walker Goal status: ONGOING  4.  Pt will increase hip flexor and abductor  gross strength to 4+/5 bilat as to improve functional strength for independent gait, increased standing tolerance, and increased ADL ability. Baseline: 10/01/22 4+/5, exception hip flexors and abductors 4/5 each bilat  (previously 03/27/22: 4+/5 ) 9/10: Hip flexor 4/5, hip abductor 4+/5 10/22: hip flexor 4/5 hip abductor 4+/5 11/26: hip flexor 4+/5 hip abductor 4+/5 Goal status: MET  5.  Pt will ambulate 150 ft with BRW and WC follow with no rest breaks in less than 12 min to allow for improved mobility within home and increased independence. Baseline: 10/03/22 150' with BRW 04/03/22: 105 ft with BRW 7/29: 100 in 9:30 with pediatric RW 9/10:101 ft with BRW stooped posture worsened throughout test 12/24/2022- 150 feet using BRW, CGA with w/c follow- in 10 min 17 sec. Will keep goal to make sure he can do this consistently 10/22: 119 ft with BRW 8 min 23 seconds terminated by patient  11/26: 148 ft with BRW 14 min 34 seconds terminated by patient 03/20/2023= 152 feet using BRW, CGA and w/c follow in 14 min 3 sec- Need to keep goal active to ensure consistency but will revised and add a time element to goal. 04/22/2023= 162 feet in 11 min 54 sec using bariatric RW, CGA and close CGA assist; 05/27/2023- 170  feet without rest break using bariatric RW. 06/17/2023= 160 feet with bariatric 4WW in 9 min- stopped due to fatigue.  Goal status: MET   6. Pt will decrease TUG to below 1 min 30 seconds/decrease in order to demonstrate decreased fall risk. Baseline:  06/17/2023= 2 min using bariatric 4WW; 09/09/2023= 1 min 50 sec with bariatric front wheeled walker.  Goal status: PROGRESSING  7. Pt will ambulate 300 ft with BRW, CGA and WC follow with no rest breaks in less than 20 min to allow for improved mobility within home/community and increased independence. Baseline: 06/17/2023= 160 feet with bariatric 4WW in 9 min- stopped due to fatigue. 07/17/2023- Patient able to walk 270 feet in > 20 min with BRW, CGA and close w/c follow today- VC for posture and for gait sequencing; 08/21/2023- Not specifically testing but patient has been walking 160 feet or more each Visit; 09/09/2023= Patient ambulated 180 feet overall at one time today using bariatric RW, CGA with VC for erect posture and for increased step length. 10/02/2023= 165 feet with Bariatric RW- close w/c follow and CGA. More diffiuclty with gait sequencing more consistent VC for erect posture.  Goal Status: PROGRESSING  ASSESSMENT :  CLINICAL IMPRESSION:   Patient presented with good motivation for today's session. He was able to work on supine activities that can be performed independently at home except bridging- instructed him to have his caregiver hold his feet to be able to clear his bottom off bed. He was very fatigued at end of session yet able to perform most activities with minimal assistance.  Pt will continue to benefit from skilled physical therapy intervention to address impairments, improve QOL, and attain therapy goals.     OBJECTIVE IMPAIRMENTS Abnormal gait, decreased activity tolerance, decreased balance, decreased coordination, decreased endurance, decreased mobility, difficulty walking, decreased ROM, decreased strength, improper body mechanics, postural dysfunction, and obesity.   ACTIVITY LIMITATIONS carrying, lifting, bending, sitting, standing, squatting, stairs, transfers, bed mobility, continence, bathing, toileting, dressing, hygiene/grooming, and locomotion level  PARTICIPATION  LIMITATIONS: meal prep, cleaning, laundry, interpersonal relationship, driving, shopping, community activity, and yard work  PERSONAL FACTORS Fitness, Past/current experiences, Time since onset of injury/illness/exacerbation, and 3+ comorbidities: hx of brain tumor, past CVA, HTN, obesity, learning disability, DMII are also affecting patient's functional outcome.   REHAB POTENTIAL: Fair    CLINICAL DECISION MAKING: Stable/uncomplicated  EVALUATION COMPLEXITY: Low  PLAN: PT FREQUENCY: 1-2x/week  PT DURATION: 12 weeks  PLANNED INTERVENTIONS: Therapeutic exercises, Therapeutic activity, Neuromuscular re-education, Balance training, Gait training, Patient/Family education, Joint mobilization, Stair training, Vestibular training, Orthotic/Fit training, DME instructions, Dry Needling, Electrical stimulation, Wheelchair mobility training, Cryotherapy, Moist heat, Compression bandaging, Manual therapy, and Re-evaluation  PLAN FOR NEXT SESSION:   -Bridging and review of some bed LE strengthening Recert next week - practice TUG -Continue with functional strengthening exercises of the LE's  -Gait activities using RW. Incorporate proper technique with all transfers to decrease his risk of falling.   Reyes LOISE London PT  Physical Therapist- [place]  [facility]   10:15 PM 11/27/23

## 2023-12-01 ENCOUNTER — Other Ambulatory Visit: Payer: Self-pay | Admitting: Student

## 2023-12-01 DIAGNOSIS — I350 Nonrheumatic aortic (valve) stenosis: Secondary | ICD-10-CM

## 2023-12-01 NOTE — Therapy (Signed)
 OUTPATIENT PHYSICAL THERAPY TREATMENT/RECERT  Patient Name: Curtis Powell MRN: [id number] DOB:[birth year], 58 y.o., male Today's Date: 12/01/2023  PCP: Sherial Bail, MD  REFERRING PROVIDER: Sherial Bail, MD        Past Medical History:  Diagnosis Date   Asthma    GERD (gastroesophageal reflux disease)    Hyperlipidemia    Hypertension    Obesity    Stroke [facility])    Past Surgical History:  Procedure Laterality Date   BRAIN SURGERY     Patient Active Problem List   Diagnosis Date Noted   Impaired glucose tolerance 03/23/2020   Learning disability 03/23/2020   Urinary and fecal incontinence 06/14/2019   Chronic pain syndrome 03/23/2019   Constipation 03/23/2019   Dysarthria 03/23/2019   Dysphagia 03/23/2019   Learning difficulty 03/23/2019   Morbid obesity (HCC) 03/23/2019   Muscle weakness 03/23/2019   Vitamin D deficiency 03/23/2019   Bilateral carotid artery stenosis 12/15/2018   Moderate aortic valve stenosis 12/15/2018   BMI 45.0-49.9, adult (HCC) 11/10/2018   Cerebrovascular accident (HCC) 05/02/2016   Hemiparesis affecting right side as late effect of cerebrovascular accident (HCC) 05/01/2016   Brain tumor (HCC) 11/24/2014   Gastro-esophageal reflux disease without esophagitis 11/24/2014   Accident due to mechanical fall without injury 08/03/2014   Essential hypertension 06/13/2014   Mixed hyperlipidemia 06/13/2014   Unspecified sequelae of cerebral infarction 06/13/2014   Thyromegaly 01/07/2014   Type 2 diabetes mellitus without complication (HCC) 01/06/2014   Neck mass 09/08/2013   Swimmer's ear 09/08/2013   Erectile dysfunction 01/03/2013   Prediabetes 01/03/2013   ONSET DATE: 05/09/2016  REFERRING DIAG: P30.648 (ICD-10-CM) - Hemiplegia and hemiparesis following cerebral infarction affecting right dominant side   THERAPY DIAG:  No diagnosis found.   Rationale for Evaluation and Treatment Rehabilitation  SUBJECTIVE:                                                                                                                                                                                              SUBJECTIVE STATEMENT  Patient reports feeling okay today. Denies any pain or falls.   Pt accompanied by: self   PERTINENT HISTORY:  The pt is a pleasant 57 yo male who returns to PT for evaluation of deficits s/o old CVA. Pt with hx of CVA Feb 2018. He was previously fully independent, but CVA caused significant deficits, mainly affecting his right side. Pt reports continued difficulty with walking and transfers. Pt still using WC for majority of mobility, otherwise he uses his RW. Pt lives with his mom who is his main caregiver. Patient had outpatient PT  previously in clinic in 2023. He reports since discharge he has been completing his HEP and walking about every day. Pt would like to improve his walking mainly, but also would like to work on balance. He also reports difficulty with transfers. Pt's mom and his caregiver helps him with ADLs. However, he says he is now waiting for a new caregiver. Prior to this pt had caregiver 3x/week. PMH significant for hx of brain tumor, past CVA, DMII and HTN. See chart for additional information.  PAIN:  Are you having pain? Pt reports no pain currently  PRECAUTIONS: fall  WEIGHT BEARING RESTRICTIONS none  PLOF: required assistance with ADLs (old CVA), prior to CVA in 2018 was indep.  History of Falls: Pt reports no recent falls  Home information: Pt lives with mother who is his primary caregiver. Previously had home aide, 3x/week but reports currently waiting for new one.  PATIENT GOALS: Pt would like to improve his ability to ambulate.  OBJECTIVE:  taken at [facility] unless otherwise noted:   DIAGNOSTIC FINDINGS: No recent imaging per chart  Objective measures taken at eval unless otherwise specified:   Posture: forward head posture, rounded shoulders  MMT LE: grossly  4+/5,  exception hip flexors and abductors 4/5 each bilat   Functional tests:  5xSTS:  34 sec, use of BUE (multiple initial attempts with posterior LOB in chair)   : 0.096 m/s with RW, close CGA, and WC follow  Gait for distance (goal 150 ft): deferred due to time  Gait mechanics: impaired, decreased speed (see ), crouched posture, poor postural stability and relies heavily on BUE support on RW. Pt now with bilat AFOS   FOTO: 44 (goal 50)   TODAY'S TREATMENT 12/02/23   Physical therapy treatment session today consisted of completing assessment of goals and administration of testing as demonstrated and documented in flow sheet, treatment, and goals section of this note. Addition treatments may be found below.   PT instructed pt in TUG: 1 min 44  sec (1 trial >13.5 sec indicates increased fall risk)     TA- To improve functional movements patterns for everyday tasks   Stepping over progressive height obstacles in // bars    PATIENT EDUCATION: Education details: If pt can lean pelvis into support surface, his energy efficicency will be higher.  Person educated: Patient Education method: Explanation, Demonstration, Tactile cues, and Verbal cues Education comprehension: verbalized understanding, returned demonstration, and verbal cues required  HOME EXERCISE PROGRAM:  Standing hip flexor stretch at counter, prolonged standing with erect posture for gluteal activation, HS curls with RTB  Access Code: [email or web address] URL: [email or web address] Date: 05/15/2023 Prepared by: Connell Kiss  Exercises - Seated Long Arc Quad with Ankle Weight  - 1 x daily - 7 x weekly - 2 sets - 10 reps  GOALS: Goals reviewed with patient? No  SHORT TERM GOALS: Target date: 11/12/2022  Pt will be independent with HEP in order to improve strength and balance in order to decrease fall risk and improve function at home. Baseline: instructed in HEP today, to provide printout next  visit Goal status: MET  LONG TERM GOALS: Target date: 02/24/2024  Pt will increase FOTO to at least 50 in order to demonstrate improvement in function related to mobility and QoL. Baseline: 44 7/29:48 9/10:40 12/24/2022= 51 Goal status: MET  2.  Pt (< 60 yrs old) will complete 5xSTS test < 30 seconds indicating an increase in LE strength and improved balance. Baseline: 10/01/22 34 sec (multiple initial  attempts with posterior LOB in chair) 12/20: 49 seconds 11/04/22:1:06, min A on multiple attempts to maintain balance and pt stays in flexed posture throughout test when coming to standing.   9/10: 27.5 sec, Pushes WC post consistently, close CGA  10/22: 47 seconds first attempt 12/5: 1 minute, 37 seconds, multiple attempts to get first stand, but no difficulty after that; 03/20/2023= 1 min 2 sec with 1UE on walker and 1 on armrest- some posterior lean- Close CGA; 06/17/2023= 38 sec with 1 UE on walker and 1 on armrest; 08/21/2023= 35 sec with 1UE on walker and 1 on armrest.  8/5: 36 sec with 1 UE on walker 1 on arm rest  Goal status: ONGOING  3.  Pt will increase to > 0.5 m/s (30 sec) as to improve gait speed for better home ambulation and reduce fall risk. Baseline: 10/01/22 0.096 m/s with RW, close CGA, and WC follow previously 12/27: 0.11 m/s 11/04/22: .056 m/s  9/10: 076 m/s 10/22: 0.64 m/s  11/26: .076 m/s; 03/20/2023= 0.08 m/s; 04/22/2023= 0.089 m/s; 05/15/2023 = 0.079 m/s; 05/27/2023= 0.11 m/s; 07/17/2023= 0.15 m/s; 08/21/2023= 0.14 m/s avg with Bariatric walker; 09/09/2023= 0.14 m/s; 10/02/2023= 0.09 m/s with Bariatric walker and Increased cues to swing RLE through vs. Previous sesisons. 11/13/2023= 0.12 m/s with Bariatric walker Goal status: ONGOING  4.  Pt will increase hip flexor and abductor  gross strength to 4+/5 bilat as to improve functional strength for independent gait, increased standing tolerance, and increased ADL ability. Baseline: 10/01/22 4+/5, exception hip flexors and abductors  4/5 each bilat  (previously 03/27/22: 4+/5 ) 9/10: Hip flexor 4/5, hip abductor 4+/5 10/22: hip flexor 4/5 hip abductor 4+/5 11/26: hip flexor 4+/5 hip abductor 4+/5 Goal status: MET  5.  Pt will ambulate 150 ft with BRW and WC follow with no rest breaks in less than 12 min to allow for improved mobility within home and increased independence. Baseline: 10/03/22 150' with BRW 04/03/22: 105 ft with BRW 7/29: 100 in 9:30 with pediatric RW 9/10:101 ft with BRW stooped posture worsened throughout test 12/24/2022- 150 feet using BRW, CGA with w/c follow- in 10 min 17 sec. Will keep goal to make sure he can do this consistently 10/22: 119 ft with BRW 8 min 23 seconds terminated by patient  11/26: 148 ft with BRW 14 min 34 seconds terminated by patient 03/20/2023= 152 feet using BRW, CGA and w/c follow in 14 min 3 sec- Need to keep goal active to ensure consistency but will revised and add a time element to goal. 04/22/2023= 162 feet in 11 min 54 sec using bariatric RW, CGA and close CGA assist; 05/27/2023- 170 feet without rest break using bariatric RW. 06/17/2023= 160 feet with bariatric 4WW in 9 min- stopped due to fatigue.  Goal status: MET   6. Pt will decrease TUG to below 1 min 30 seconds/decrease in order to demonstrate decreased fall risk. Baseline: 06/17/2023= 2 min using bariatric 4WW; 09/09/2023= 1 min 50 sec with bariatric front wheeled walker. 12/02/2023= 1 min 44 sec  Goal status: PROGRESSING  7. Pt will ambulate 300 ft with BRW, CGA and WC follow with no rest breaks in less than 20 min to allow for improved mobility within home/community and increased independence. Baseline: 06/17/2023= 160 feet with bariatric 4WW in 9 min- stopped due to fatigue. 07/17/2023- Patient able to walk 270 feet in > 20 min with BRW, CGA and close w/c follow today- VC for posture and for gait sequencing; 08/21/2023-  Not specifically testing but patient has been walking 160 feet or more each Visit; 09/09/2023= Patient  ambulated 180 feet overall at one time today using bariatric RW, CGA with VC for erect posture and for increased step length. 10/02/2023= 165 feet with Bariatric RW- close w/c follow and CGA. More diffiuclty with gait sequencing more consistent VC for erect posture. 12/02/2023- Paitent ambulated approx 100 feet in // bars today but will reassess using walker next visit.  Goal Status: PROGRESSING  8. Patient will report ability to walk out his front door including stepping over threshold using his walker with min A of caregiver to enable him to be more independent with household mobility and be able to go out to porch with less dependence on caregivers. Baseline: Patient dependent on caregivers to push him in his wheelchair to go out to porch and reports dependent on them to always assist so he doesn't go outside like he would like to. Goal status: NEW  ASSESSMENT :  CLINICAL IMPRESSION:    Pt will continue to benefit from skilled physical therapy intervention to address impairments, improve QOL, and attain therapy goals.     OBJECTIVE IMPAIRMENTS Abnormal gait, decreased activity tolerance, decreased balance, decreased coordination, decreased endurance, decreased mobility, difficulty walking, decreased ROM, decreased strength, improper body mechanics, postural dysfunction, and obesity.   ACTIVITY LIMITATIONS carrying, lifting, bending, sitting, standing, squatting, stairs, transfers, bed mobility, continence, bathing, toileting, dressing, hygiene/grooming, and locomotion level  PARTICIPATION LIMITATIONS: meal prep, cleaning, laundry, interpersonal relationship, driving, shopping, community activity, and yard work  PERSONAL FACTORS Fitness, Past/current experiences, Time since onset of injury/illness/exacerbation, and 3+ comorbidities: hx of brain tumor, past CVA, HTN, obesity, learning disability, DMII are also affecting patient's functional outcome.   REHAB POTENTIAL: Fair    CLINICAL DECISION  MAKING: Stable/uncomplicated  EVALUATION COMPLEXITY: Low  PLAN: PT FREQUENCY: 1-2x/week  PT DURATION: 12 weeks  PLANNED INTERVENTIONS: Therapeutic exercises, Therapeutic activity, Neuromuscular re-education, Balance training, Gait training, Patient/Family education, Joint mobilization, Stair training, Vestibular training, Orthotic/Fit training, DME instructions, Dry Needling, Electrical stimulation, Wheelchair mobility training, Cryotherapy, Moist heat, Compression bandaging, Manual therapy, and Re-evaluation  PLAN FOR NEXT SESSION:   -Bridging and review of some bed LE strengthening Recert next week - practice TUG -Continue with functional strengthening exercises of the LE's  -Gait activities using RW. Incorporate proper technique with all transfers to decrease his risk of falling.   Reyes LOISE London PT  Physical Therapist- [place]   9:46 AM 12/01/23

## 2023-12-02 ENCOUNTER — Ambulatory Visit

## 2023-12-02 DIAGNOSIS — M6281 Muscle weakness (generalized): Secondary | ICD-10-CM

## 2023-12-02 DIAGNOSIS — R262 Difficulty in walking, not elsewhere classified: Secondary | ICD-10-CM | POA: Diagnosis not present

## 2023-12-02 DIAGNOSIS — R2681 Unsteadiness on feet: Secondary | ICD-10-CM

## 2023-12-02 DIAGNOSIS — R2689 Other abnormalities of gait and mobility: Secondary | ICD-10-CM

## 2023-12-02 DIAGNOSIS — I69351 Hemiplegia and hemiparesis following cerebral infarction affecting right dominant side: Secondary | ICD-10-CM

## 2023-12-02 DIAGNOSIS — R269 Unspecified abnormalities of gait and mobility: Secondary | ICD-10-CM

## 2023-12-02 DIAGNOSIS — R296 Repeated falls: Secondary | ICD-10-CM

## 2023-12-02 DIAGNOSIS — R531 Weakness: Secondary | ICD-10-CM

## 2023-12-04 ENCOUNTER — Ambulatory Visit

## 2023-12-09 ENCOUNTER — Ambulatory Visit

## 2023-12-11 ENCOUNTER — Ambulatory Visit: Attending: Internal Medicine

## 2023-12-11 DIAGNOSIS — R531 Weakness: Secondary | ICD-10-CM | POA: Diagnosis present

## 2023-12-11 DIAGNOSIS — R269 Unspecified abnormalities of gait and mobility: Secondary | ICD-10-CM | POA: Insufficient documentation

## 2023-12-11 DIAGNOSIS — I10 Essential (primary) hypertension: Secondary | ICD-10-CM | POA: Insufficient documentation

## 2023-12-11 DIAGNOSIS — R296 Repeated falls: Secondary | ICD-10-CM | POA: Diagnosis not present

## 2023-12-11 DIAGNOSIS — R262 Difficulty in walking, not elsewhere classified: Secondary | ICD-10-CM | POA: Diagnosis present

## 2023-12-11 DIAGNOSIS — R278 Other lack of coordination: Secondary | ICD-10-CM | POA: Diagnosis not present

## 2023-12-11 DIAGNOSIS — R2681 Unsteadiness on feet: Secondary | ICD-10-CM | POA: Insufficient documentation

## 2023-12-11 DIAGNOSIS — R2689 Other abnormalities of gait and mobility: Secondary | ICD-10-CM | POA: Diagnosis not present

## 2023-12-11 DIAGNOSIS — M6281 Muscle weakness (generalized): Secondary | ICD-10-CM | POA: Insufficient documentation

## 2023-12-11 DIAGNOSIS — I352 Nonrheumatic aortic (valve) stenosis with insufficiency: Secondary | ICD-10-CM | POA: Diagnosis not present

## 2023-12-11 DIAGNOSIS — E119 Type 2 diabetes mellitus without complications: Secondary | ICD-10-CM | POA: Insufficient documentation

## 2023-12-11 DIAGNOSIS — E785 Hyperlipidemia, unspecified: Secondary | ICD-10-CM | POA: Insufficient documentation

## 2023-12-11 DIAGNOSIS — I69351 Hemiplegia and hemiparesis following cerebral infarction affecting right dominant side: Secondary | ICD-10-CM | POA: Insufficient documentation

## 2023-12-11 NOTE — Therapy (Unsigned)
 OUTPATIENT PHYSICAL THERAPY TREATMENT  Patient Name: Curtis Powell MRN: [id number] DOB:02/16/1966, 58 y.o., male Today's Date: 12/12/2023  PCP: Sherial Bail, MD  REFERRING PROVIDER: Sherial Bail, MD    PT End of Session - 12/11/23 1323     Visit Number 107    Number of Visits 130    Date for PT Re-Evaluation 02/24/24    Progress Note Due on Visit 110    PT Start Time 1314    PT Stop Time 1358    PT Time Calculation (min) 44 min    Equipment Utilized During Treatment Gait belt    Activity Tolerance Patient tolerated treatment well    Behavior During Therapy WFL for tasks assessed/performed             Past Medical History:  Diagnosis Date   Asthma    GERD (gastroesophageal reflux disease)    Hyperlipidemia    Hypertension    Obesity    Stroke [facility])    Past Surgical History:  Procedure Laterality Date   BRAIN SURGERY     Patient Active Problem List   Diagnosis Date Noted   Impaired glucose tolerance 03/23/2020   Learning disability 03/23/2020   Urinary and fecal incontinence 06/14/2019   Chronic pain syndrome 03/23/2019   Constipation 03/23/2019   Dysarthria 03/23/2019   Dysphagia 03/23/2019   Learning difficulty 03/23/2019   Morbid obesity (HCC) 03/23/2019   Muscle weakness 03/23/2019   Vitamin D deficiency 03/23/2019   Bilateral carotid artery stenosis 12/15/2018   Moderate aortic valve stenosis 12/15/2018   BMI 45.0-49.9, adult (HCC) 11/10/2018   Cerebrovascular accident (HCC) 05/02/2016   Hemiparesis affecting right side as late effect of cerebrovascular accident (HCC) 05/01/2016   Brain tumor (HCC) 11/24/2014   Gastro-esophageal reflux disease without esophagitis 11/24/2014   Accident due to mechanical fall without injury 08/03/2014   Essential hypertension 06/13/2014   Mixed hyperlipidemia 06/13/2014   Unspecified sequelae of cerebral infarction 06/13/2014   Thyromegaly 01/07/2014   Type 2 diabetes mellitus without complication  (HCC) 01/06/2014   Neck mass 09/08/2013   Swimmer's ear 09/08/2013   Erectile dysfunction 01/03/2013   Prediabetes 01/03/2013   ONSET DATE: 05/09/2016  REFERRING DIAG: P30.648 (ICD-10-CM) - Hemiplegia and hemiparesis following cerebral infarction affecting right dominant side   THERAPY DIAG:  Difficulty in walking, not elsewhere classified  Unsteadiness on feet  Muscle weakness (generalized)  Abnormality of gait and mobility  Left-sided weakness  Repeated falls  Other abnormalities of gait and mobility   Rationale for Evaluation and Treatment Rehabilitation  SUBJECTIVE:  SUBJECTIVE STATEMENT  Patient reports missing visit due to stomach illness but states feeling better today.    Pt accompanied by: self   PERTINENT HISTORY:  The pt is a pleasant 58 yo male who returns to PT for evaluation of deficits s/o old CVA. Pt with hx of CVA Feb 2018. He was previously fully independent, but CVA caused significant deficits, mainly affecting his right side. Pt reports continued difficulty with walking and transfers. Pt still using WC for majority of mobility, otherwise he uses his RW. Pt lives with his mom who is his main caregiver. Patient had outpatient PT previously in clinic in 2023. He reports since discharge he has been completing his HEP and walking about every day. Pt would like to improve his walking mainly, but also would like to work on balance. He also reports difficulty with transfers. Pt's mom and his caregiver helps him with ADLs. However, he says he is now waiting for a new caregiver. Prior to this pt had caregiver 3x/week. PMH significant for hx of brain tumor, past CVA, DMII and HTN. See chart for additional information.  PAIN:  Are you having pain? Pt reports no pain  currently  PRECAUTIONS: fall  WEIGHT BEARING RESTRICTIONS none  PLOF: required assistance with ADLs (old CVA), prior to CVA in 2018 was indep.  History of Falls: Pt reports no recent falls  Home information: Pt lives with mother who is his primary caregiver. Previously had home aide, 3x/week but reports currently waiting for new one.  PATIENT GOALS: Pt would like to improve his ability to ambulate.  OBJECTIVE:  taken at [facility] unless otherwise noted:   DIAGNOSTIC FINDINGS: No recent imaging per chart  Objective measures taken at eval unless otherwise specified:   Posture: forward head posture, rounded shoulders  MMT LE: grossly  4+/5, exception hip flexors and abductors 4/5 each bilat   Functional tests:  5xSTS:  34 sec, use of BUE (multiple initial attempts with posterior LOB in chair)   : 0.096 m/s with RW, close CGA, and WC follow  Gait for distance (goal 150 ft): deferred due to time  Gait mechanics: impaired, decreased speed (see ), crouched posture, poor postural stability and relies heavily on BUE support on RW. Pt now with bilat AFOS   FOTO: 44 (goal 50)   TODAY'S TREATMENT 12/02/23    TE:  Sitting active hip flex 2 x 10 each LE Sitting Knee ext (hold 2 sec) 2 x 10 reps ( mild fatigue on last set)   TA- To improve functional movements patterns for everyday tasks   Static stand without UE support x 4 min Sit to stand with (CGA to min A) x 8 reps - Min VC for hand placement Ambulation in clinic x 204' using BRW, CGA- Intermittent VC  for posture and gait sequencing. Patient continues to progress overall with step length and able to improve his 10 meter gait speed overall today.       PATIENT EDUCATION: Education details: If pt can lean pelvis into support surface, his energy efficicency will be higher.  Person educated: Patient Education method: Explanation, Demonstration, Tactile cues, and Verbal cues Education comprehension: verbalized  understanding, returned demonstration, and verbal cues required  HOME EXERCISE PROGRAM:  Standing hip flexor stretch at counter, prolonged standing with erect posture for gluteal activation, HS curls with RTB  Access Code: [email or web address] URL: [email or web address] Date: 05/15/2023 Prepared by: Connell Kiss  Exercises - Seated Long Arc Quad with Ankle Weight  - 1 x  daily - 7 x weekly - 2 sets - 10 reps  GOALS: Goals reviewed with patient? No  SHORT TERM GOALS: Target date: 11/12/2022  Pt will be independent with HEP in order to improve strength and balance in order to decrease fall risk and improve function at home. Baseline: instructed in HEP today, to provide printout next visit Goal status: MET  LONG TERM GOALS: Target date: 02/24/2024  Pt will increase FOTO to at least 50 in order to demonstrate improvement in function related to mobility and QoL. Baseline: 44 7/29:48 9/10:40 12/24/2022= 51 Goal status: MET  2.  Pt (< 60 yrs old) will complete 5xSTS test < 30 seconds indicating an increase in LE strength and improved balance. Baseline: 10/01/22 34 sec (multiple initial attempts with posterior LOB in chair) 12/20: 49 seconds 11/04/22:1:06, min A on multiple attempts to maintain balance and pt stays in flexed posture throughout test when coming to standing.   9/10: 27.5 sec, Pushes WC post consistently, close CGA  10/22: 47 seconds first attempt 12/5: 1 minute, 37 seconds, multiple attempts to get first stand, but no difficulty after that; 03/20/2023= 1 min 2 sec with 1UE on walker and 1 on armrest- some posterior lean- Close CGA; 06/17/2023= 38 sec with 1 UE on walker and 1 on armrest; 08/21/2023= 35 sec with 1UE on walker and 1 on armrest.  8/5: 36 sec with 1 UE on walker 1 on arm rest  Goal status: ONGOING  3.  Pt will increase to > 0.5 m/s (30 sec) as to improve gait speed for better home ambulation and reduce fall risk. Baseline: 10/01/22 0.096 m/s with RW, close  CGA, and WC follow previously 12/27: 0.11 m/s 11/04/22: .056 m/s  9/10: 076 m/s 10/22: 0.64 m/s  11/26: .076 m/s; 03/20/2023= 0.08 m/s; 04/22/2023= 0.089 m/s; 05/15/2023 = 0.079 m/s; 05/27/2023= 0.11 m/s; 07/17/2023= 0.15 m/s; 08/21/2023= 0.14 m/s avg with Bariatric walker; 09/09/2023= 0.14 m/s; 10/02/2023= 0.09 m/s with Bariatric walker and Increased cues to swing RLE through vs. Previous sesisons. 11/13/2023= 0.12 m/s with Bariatric walker; 12/11/2023= 0.14 m/s  Goal status: ONGOING  4.  Pt will increase hip flexor and abductor  gross strength to 4+/5 bilat as to improve functional strength for independent gait, increased standing tolerance, and increased ADL ability. Baseline: 10/01/22 4+/5, exception hip flexors and abductors 4/5 each bilat  (previously 03/27/22: 4+/5 ) 9/10: Hip flexor 4/5, hip abductor 4+/5 10/22: hip flexor 4/5 hip abductor 4+/5 11/26: hip flexor 4+/5 hip abductor 4+/5 Goal status: MET  5.  Pt will ambulate 150 ft with BRW and WC follow with no rest breaks in less than 12 min to allow for improved mobility within home and increased independence. Baseline: 10/03/22 150' with BRW 04/03/22: 105 ft with BRW 7/29: 100 in 9:30 with pediatric RW 9/10:101 ft with BRW stooped posture worsened throughout test 12/24/2022- 150 feet using BRW, CGA with w/c follow- in 10 min 17 sec. Will keep goal to make sure he can do this consistently 10/22: 119 ft with BRW 8 min 23 seconds terminated by patient  11/26: 148 ft with BRW 14 min 34 seconds terminated by patient 03/20/2023= 152 feet using BRW, CGA and w/c follow in 14 min 3 sec- Need to keep goal active to ensure consistency but will revised and add a time element to goal. 04/22/2023= 162 feet in 11 min 54 sec using bariatric RW, CGA and close CGA assist; 05/27/2023- 170 feet without rest break using bariatric RW. 06/17/2023= 160  feet with bariatric 4WW in 9 min- stopped due to fatigue.  Goal status: MET   6. Pt will decrease TUG to below 1 min 30  seconds/decrease in order to demonstrate decreased fall risk. Baseline: 06/17/2023= 2 min using bariatric 4WW; 09/09/2023= 1 min 50 sec with bariatric front wheeled walker. 12/02/2023= 1 min 44 sec  Goal status: PROGRESSING  7. Pt will ambulate 300 ft with BRW, CGA and WC follow with no rest breaks in less than 20 min to allow for improved mobility within home/community and increased independence. Baseline: 06/17/2023= 160 feet with bariatric 4WW in 9 min- stopped due to fatigue. 07/17/2023- Patient able to walk 270 feet in > 20 min with BRW, CGA and close w/c follow today- VC for posture and for gait sequencing; 08/21/2023- Not specifically testing but patient has been walking 160 feet or more each Visit; 09/09/2023= Patient ambulated 180 feet overall at one time today using bariatric RW, CGA with VC for erect posture and for increased step length. 10/02/2023= 165 feet with Bariatric RW- close w/c follow and CGA. More diffiuclty with gait sequencing more consistent VC for erect posture. 12/02/2023- Paitent ambulated >100 feet in // bars today but will reassess using walker next visit. 12/11/2023= Patient ambulated 204 feet with BRW and Close CGA and w/c follow Goal Status: PROGRESSING  8. Patient will report ability to walk out his front door including stepping over threshold using his walker with min A of caregiver to enable him to be more independent with household mobility and be able to go out to porch with less dependence on caregivers. Baseline: Patient dependent on caregivers to push him in his wheelchair to go out to porch and reports dependent on them to always assist so he doesn't go outside like he would like to. Goal status: NEW  ASSESSMENT :  CLINICAL IMPRESSION:   Patient participated well overall today and had his best day of distance ambulation in a long time. He started off strong- also improving his gait speed and stated he felt good today and required less overall VC for gait technique. As he  fatigued- he presented with more forward flexed posture and decreased step length.   Pt will continue to benefit from skilled physical therapy intervention to address impairments, improve QOL, and attain therapy goals.     OBJECTIVE IMPAIRMENTS Abnormal gait, decreased activity tolerance, decreased balance, decreased coordination, decreased endurance, decreased mobility, difficulty walking, decreased ROM, decreased strength, improper body mechanics, postural dysfunction, and obesity.   ACTIVITY LIMITATIONS carrying, lifting, bending, sitting, standing, squatting, stairs, transfers, bed mobility, continence, bathing, toileting, dressing, hygiene/grooming, and locomotion level  PARTICIPATION LIMITATIONS: meal prep, cleaning, laundry, interpersonal relationship, driving, shopping, community activity, and yard work  PERSONAL FACTORS Fitness, Past/current experiences, Time since onset of injury/illness/exacerbation, and 3+ comorbidities: hx of brain tumor, past CVA, HTN, obesity, learning disability, DMII are also affecting patient's functional outcome.   REHAB POTENTIAL: Fair    CLINICAL DECISION MAKING: Stable/uncomplicated  EVALUATION COMPLEXITY: Low  PLAN: PT FREQUENCY: 1-2x/week  PT DURATION: 12 weeks  PLANNED INTERVENTIONS: Therapeutic exercises, Therapeutic activity, Neuromuscular re-education, Balance training, Gait training, Patient/Family education, Joint mobilization, Stair training, Vestibular training, Orthotic/Fit training, DME instructions, Dry Needling, Electrical stimulation, Wheelchair mobility training, Cryotherapy, Moist heat, Compression bandaging, Manual therapy, and Re-evaluation  PLAN FOR NEXT SESSION:   -Bridging and review of some bed LE strengthening -Continue with functional strengthening exercises of the LE's  -Gait activities using RW. Incorporate proper technique with all transfers to decrease his  risk of falling.   Reyes LOISE London PT  Physical  Therapist- [place]  [facility]   9:16 AM 12/12/23

## 2023-12-16 ENCOUNTER — Ambulatory Visit (HOSPITAL_BASED_OUTPATIENT_CLINIC_OR_DEPARTMENT_OTHER)
Admission: RE | Admit: 2023-12-16 | Discharge: 2023-12-16 | Disposition: A | Discharge status: Home / Self Care | Source: Ambulatory Visit | Attending: Student | Admitting: Student

## 2023-12-16 ENCOUNTER — Ambulatory Visit

## 2023-12-16 DIAGNOSIS — R2681 Unsteadiness on feet: Secondary | ICD-10-CM

## 2023-12-16 DIAGNOSIS — R262 Difficulty in walking, not elsewhere classified: Secondary | ICD-10-CM

## 2023-12-16 DIAGNOSIS — I350 Nonrheumatic aortic (valve) stenosis: Secondary | ICD-10-CM

## 2023-12-16 DIAGNOSIS — I517 Cardiomegaly: Secondary | ICD-10-CM | POA: Insufficient documentation

## 2023-12-16 DIAGNOSIS — R278 Other lack of coordination: Secondary | ICD-10-CM

## 2023-12-16 DIAGNOSIS — I69351 Hemiplegia and hemiparesis following cerebral infarction affecting right dominant side: Secondary | ICD-10-CM

## 2023-12-16 DIAGNOSIS — R531 Weakness: Secondary | ICD-10-CM

## 2023-12-16 DIAGNOSIS — R2689 Other abnormalities of gait and mobility: Secondary | ICD-10-CM

## 2023-12-16 DIAGNOSIS — I352 Nonrheumatic aortic (valve) stenosis with insufficiency: Secondary | ICD-10-CM | POA: Insufficient documentation

## 2023-12-16 DIAGNOSIS — R296 Repeated falls: Secondary | ICD-10-CM

## 2023-12-16 DIAGNOSIS — M6281 Muscle weakness (generalized): Secondary | ICD-10-CM

## 2023-12-16 DIAGNOSIS — I371 Nonrheumatic pulmonary valve insufficiency: Secondary | ICD-10-CM | POA: Insufficient documentation

## 2023-12-16 DIAGNOSIS — R269 Unspecified abnormalities of gait and mobility: Secondary | ICD-10-CM

## 2023-12-16 LAB — ECHOCARDIOGRAM COMPLETE
AR max vel: 0.85 cm2
AV Area VTI: 0.81 cm2
AV Area mean vel: 0.83 cm2
AV Mean grad: 23 mmHg
AV Peak grad: 36.2 mmHg
Ao pk vel: 3.01 m/s
Area-P 1/2: 5.23 cm2
MV VTI: 1.95 cm2
P 1/2 time: 259 ms
S' Lateral: 2.2 cm

## 2023-12-16 NOTE — Progress Notes (Signed)
 Echocardiogram 2D Echocardiogram has been performed.  Curtis Powell 12/16/2023, 5:08 PM

## 2023-12-16 NOTE — Therapy (Unsigned)
 OUTPATIENT PHYSICAL THERAPY TREATMENT  Patient Name: TAHJAE CLAUSING MRN: [id number] DOB:September 08, 1965, 58 y.o., male Today's Date: 12/16/2023  PCP: Sherial Bail, MD  REFERRING PROVIDER: Sherial Bail, MD    PT End of Session - 12/16/23 1400     Visit Number 108    Number of Visits 130    Date for PT Re-Evaluation 02/24/24    Progress Note Due on Visit 110    PT Start Time 1354    PT Stop Time 1436    PT Time Calculation (min) 42 min    Equipment Utilized During Treatment Gait belt    Activity Tolerance Patient tolerated treatment well    Behavior During Therapy WFL for tasks assessed/performed             Past Medical History:  Diagnosis Date   Asthma    GERD (gastroesophageal reflux disease)    Hyperlipidemia    Hypertension    Obesity    Stroke [facility])    Past Surgical History:  Procedure Laterality Date   BRAIN SURGERY     Patient Active Problem List   Diagnosis Date Noted   Impaired glucose tolerance 03/23/2020   Learning disability 03/23/2020   Urinary and fecal incontinence 06/14/2019   Chronic pain syndrome 03/23/2019   Constipation 03/23/2019   Dysarthria 03/23/2019   Dysphagia 03/23/2019   Learning difficulty 03/23/2019   Morbid obesity (HCC) 03/23/2019   Muscle weakness 03/23/2019   Vitamin D deficiency 03/23/2019   Bilateral carotid artery stenosis 12/15/2018   Moderate aortic valve stenosis 12/15/2018   BMI 45.0-49.9, adult (HCC) 11/10/2018   Cerebrovascular accident (HCC) 05/02/2016   Hemiparesis affecting right side as late effect of cerebrovascular accident (HCC) 05/01/2016   Brain tumor (HCC) 11/24/2014   Gastro-esophageal reflux disease without esophagitis 11/24/2014   Accident due to mechanical fall without injury 08/03/2014   Essential hypertension 06/13/2014   Mixed hyperlipidemia 06/13/2014   Unspecified sequelae of cerebral infarction 06/13/2014   Thyromegaly 01/07/2014   Type 2 diabetes mellitus without complication  (HCC) 01/06/2014   Neck mass 09/08/2013   Swimmer's ear 09/08/2013   Erectile dysfunction 01/03/2013   Prediabetes 01/03/2013   ONSET DATE: 05/09/2016  REFERRING DIAG: P30.648 (ICD-10-CM) - Hemiplegia and hemiparesis following cerebral infarction affecting right dominant side   THERAPY DIAG:  Difficulty in walking, not elsewhere classified  Unsteadiness on feet  Muscle weakness (generalized)  Abnormality of gait and mobility  Left-sided weakness  Repeated falls  Other abnormalities of gait and mobility  Hemiplegia and hemiparesis following cerebral infarction affecting right dominant side (HCC)  Other lack of coordination   Rationale for Evaluation and Treatment Rehabilitation  SUBJECTIVE:  SUBJECTIVE STATEMENT  Patient reports feeling good and over his stomach issues. Aunt reports he has been eating better and trying to lose a few pounds.   Pt accompanied by: aunt  PERTINENT HISTORY:  The pt is a pleasant 58 yo male who returns to PT for evaluation of deficits s/o old CVA. Pt with hx of CVA Feb 2018. He was previously fully independent, but CVA caused significant deficits, mainly affecting his right side. Pt reports continued difficulty with walking and transfers. Pt still using WC for majority of mobility, otherwise he uses his RW. Pt lives with his mom who is his main caregiver. Patient had outpatient PT previously in clinic in 2023. He reports since discharge he has been completing his HEP and walking about every day. Pt would like to improve his walking mainly, but also would like to work on balance. He also reports difficulty with transfers. Pt's mom and his caregiver helps him with ADLs. However, he says he is now waiting for a new caregiver. Prior to this pt had caregiver 3x/week. PMH  significant for hx of brain tumor, past CVA, DMII and HTN. See chart for additional information.  PAIN:  Are you having pain? Pt reports no pain currently  PRECAUTIONS: fall  WEIGHT BEARING RESTRICTIONS none  PLOF: required assistance with ADLs (old CVA), prior to CVA in 2018 was indep.  History of Falls: Pt reports no recent falls  Home information: Pt lives with mother who is his primary caregiver. Previously had home aide, 3x/week but reports currently waiting for new one.  PATIENT GOALS: Pt would like to improve his ability to ambulate.  OBJECTIVE:  taken at [facility] unless otherwise noted:   DIAGNOSTIC FINDINGS: No recent imaging per chart  Objective measures taken at eval unless otherwise specified:   Posture: forward head posture, rounded shoulders  MMT LE: grossly  4+/5, exception hip flexors and abductors 4/5 each bilat   Functional tests:  5xSTS:  34 sec, use of BUE (multiple initial attempts with posterior LOB in chair)   : 0.096 m/s with RW, close CGA, and WC follow  Gait for distance (goal 150 ft): deferred due to time  Gait mechanics: impaired, decreased speed (see ), crouched posture, poor postural stability and relies heavily on BUE support on RW. Pt now with bilat AFOS   FOTO: 44 (goal 50)   TODAY'S TREATMENT 12/16/23     TA- To improve functional movements patterns for everyday tasks   Static stand without UE support x 5 min Standing dynamic weight shifting L- R without UE support x 20 reps - VC to shift to R side. Dynamic weight shift activity- at support bar with dry erase boar- Reaching for magnet letters and then reaching diagonal/overhead to right then to left - back and forth for approx 10 min Sit to stand with (CGA to min A) x 5 reps - Min VC for hand placement Ambulation in clinic - 10 meter x 2 using BRW, CGA- gait speed measured at 1:min 46 and 1 min 41 sec today.      PATIENT EDUCATION: Education details: If pt can lean pelvis  into support surface, his energy efficicency will be higher.  Person educated: Patient Education method: Explanation, Demonstration, Tactile cues, and Verbal cues Education comprehension: verbalized understanding, returned demonstration, and verbal cues required  HOME EXERCISE PROGRAM:  Standing hip flexor stretch at counter, prolonged standing with erect posture for gluteal activation, HS curls with RTB  Access Code: [email or web address] URL: [email or web address] Date: 05/15/2023  Prepared by: Connell Kiss  Exercises - Seated Long Arc Quad with Ankle Weight  - 1 x daily - 7 x weekly - 2 sets - 10 reps  GOALS: Goals reviewed with patient? No  SHORT TERM GOALS: Target date: 11/12/2022  Pt will be independent with HEP in order to improve strength and balance in order to decrease fall risk and improve function at home. Baseline: instructed in HEP today, to provide printout next visit Goal status: MET  LONG TERM GOALS: Target date: 02/24/2024  Pt will increase FOTO to at least 50 in order to demonstrate improvement in function related to mobility and QoL. Baseline: 44 7/29:48 9/10:40 12/24/2022= 51 Goal status: MET  2.  Pt (< 60 yrs old) will complete 5xSTS test < 30 seconds indicating an increase in LE strength and improved balance. Baseline: 10/01/22 34 sec (multiple initial attempts with posterior LOB in chair) 12/20: 49 seconds 11/04/22:1:06, min A on multiple attempts to maintain balance and pt stays in flexed posture throughout test when coming to standing.   9/10: 27.5 sec, Pushes WC post consistently, close CGA  10/22: 47 seconds first attempt 12/5: 1 minute, 37 seconds, multiple attempts to get first stand, but no difficulty after that; 03/20/2023= 1 min 2 sec with 1UE on walker and 1 on armrest- some posterior lean- Close CGA; 06/17/2023= 38 sec with 1 UE on walker and 1 on armrest; 08/21/2023= 35 sec with 1UE on walker and 1 on armrest.  8/5: 36 sec with 1 UE on walker 1 on  arm rest  Goal status: ONGOING  3.  Pt will increase to > 0.5 m/s (30 sec) as to improve gait speed for better home ambulation and reduce fall risk. Baseline: 10/01/22 0.096 m/s with RW, close CGA, and WC follow previously 12/27: 0.11 m/s 11/04/22: .056 m/s  9/10: 076 m/s 10/22: 0.64 m/s  11/26: .076 m/s; 03/20/2023= 0.08 m/s; 04/22/2023= 0.089 m/s; 05/15/2023 = 0.079 m/s; 05/27/2023= 0.11 m/s; 07/17/2023= 0.15 m/s; 08/21/2023= 0.14 m/s avg with Bariatric walker; 09/09/2023= 0.14 m/s; 10/02/2023= 0.09 m/s with Bariatric walker and Increased cues to swing RLE through vs. Previous sesisons. 11/13/2023= 0.12 m/s with Bariatric walker; 12/11/2023= 0.14 m/s Goal status: ONGOING  4.  Pt will increase hip flexor and abductor  gross strength to 4+/5 bilat as to improve functional strength for independent gait, increased standing tolerance, and increased ADL ability. Baseline: 10/01/22 4+/5, exception hip flexors and abductors 4/5 each bilat  (previously 03/27/22: 4+/5 ) 9/10: Hip flexor 4/5, hip abductor 4+/5 10/22: hip flexor 4/5 hip abductor 4+/5 11/26: hip flexor 4+/5 hip abductor 4+/5 Goal status: MET  5.  Pt will ambulate 150 ft with BRW and WC follow with no rest breaks in less than 12 min to allow for improved mobility within home and increased independence. Baseline: 10/03/22 150' with BRW 04/03/22: 105 ft with BRW 7/29: 100 in 9:30 with pediatric RW 9/10:101 ft with BRW stooped posture worsened throughout test 12/24/2022- 150 feet using BRW, CGA with w/c follow- in 10 min 17 sec. Will keep goal to make sure he can do this consistently 10/22: 119 ft with BRW 8 min 23 seconds terminated by patient  11/26: 148 ft with BRW 14 min 34 seconds terminated by patient 03/20/2023= 152 feet using BRW, CGA and w/c follow in 14 min 3 sec- Need to keep goal active to ensure consistency but will revised and add a time element to goal. 04/22/2023= 162 feet in 11 min 54 sec using bariatric  RW, CGA and close CGA assist;  05/27/2023- 170 feet without rest break using bariatric RW. 06/17/2023= 160 feet with bariatric 4WW in 9 min- stopped due to fatigue.  Goal status: MET   6. Pt will decrease TUG to below 1 min 30 seconds/decrease in order to demonstrate decreased fall risk. Baseline: 06/17/2023= 2 min using bariatric 4WW; 09/09/2023= 1 min 50 sec with bariatric front wheeled walker. 12/02/2023= 1 min 44 sec  Goal status: PROGRESSING  7. Pt will ambulate 300 ft with BRW, CGA and WC follow with no rest breaks in less than 20 min to allow for improved mobility within home/community and increased independence. Baseline: 06/17/2023= 160 feet with bariatric 4WW in 9 min- stopped due to fatigue. 07/17/2023- Patient able to walk 270 feet in > 20 min with BRW, CGA and close w/c follow today- VC for posture and for gait sequencing; 08/21/2023- Not specifically testing but patient has been walking 160 feet or more each Visit; 09/09/2023= Patient ambulated 180 feet overall at one time today using bariatric RW, CGA with VC for erect posture and for increased step length. 10/02/2023= 165 feet with Bariatric RW- close w/c follow and CGA. More diffiuclty with gait sequencing more consistent VC for erect posture. 12/02/2023- Paitent ambulated >100 feet in // bars today but will reassess using walker next visit. 12/11/2023= Patient ambulated 204 feet with BRW and Close CGA and w/c follow Goal Status: PROGRESSING  8. Patient will report ability to walk out his front door including stepping over threshold using his walker with min A of caregiver to enable him to be more independent with household mobility and be able to go out to porch with less dependence on caregivers. Baseline: Patient dependent on caregivers to push him in his wheelchair to go out to porch and reports dependent on them to always assist so he doesn't go outside like he would like to. Goal status: NEW  ASSESSMENT :  CLINICAL IMPRESSION:   Patient  continues to participate well and  experienced his quickest 10 Meter walk to date. s best day of distance ambulation in a long time. He started off strong- also improving his gait speed and stated he felt good today and required less overall VC for gait technique. As he fatigued- he presented with more forward flexed posture and decreased step length.   Pt will continue to benefit from skilled physical therapy intervention to address impairments, improve QOL, and attain therapy goals.     OBJECTIVE IMPAIRMENTS Abnormal gait, decreased activity tolerance, decreased balance, decreased coordination, decreased endurance, decreased mobility, difficulty walking, decreased ROM, decreased strength, improper body mechanics, postural dysfunction, and obesity.   ACTIVITY LIMITATIONS carrying, lifting, bending, sitting, standing, squatting, stairs, transfers, bed mobility, continence, bathing, toileting, dressing, hygiene/grooming, and locomotion level  PARTICIPATION LIMITATIONS: meal prep, cleaning, laundry, interpersonal relationship, driving, shopping, community activity, and yard work  PERSONAL FACTORS Fitness, Past/current experiences, Time since onset of injury/illness/exacerbation, and 3+ comorbidities: hx of brain tumor, past CVA, HTN, obesity, learning disability, DMII are also affecting patient's functional outcome.   REHAB POTENTIAL: Fair    CLINICAL DECISION MAKING: Stable/uncomplicated  EVALUATION COMPLEXITY: Low  PLAN: PT FREQUENCY: 1-2x/week  PT DURATION: 12 weeks  PLANNED INTERVENTIONS: Therapeutic exercises, Therapeutic activity, Neuromuscular re-education, Balance training, Gait training, Patient/Family education, Joint mobilization, Stair training, Vestibular training, Orthotic/Fit training, DME instructions, Dry Needling, Electrical stimulation, Wheelchair mobility training, Cryotherapy, Moist heat, Compression bandaging, Manual therapy, and Re-evaluation  PLAN FOR NEXT SESSION:   -Bridging and review of some  bed  LE strengthening -Continue with functional strengthening exercises of the LE's  -Gait activities using RW. Incorporate proper technique with all transfers to decrease his risk of falling.   Reyes LOISE London PT  Physical Therapist- [place]  [facility]   2:38 PM 12/16/23

## 2023-12-18 ENCOUNTER — Ambulatory Visit

## 2023-12-18 DIAGNOSIS — R2681 Unsteadiness on feet: Secondary | ICD-10-CM

## 2023-12-18 DIAGNOSIS — R296 Repeated falls: Secondary | ICD-10-CM

## 2023-12-18 DIAGNOSIS — R269 Unspecified abnormalities of gait and mobility: Secondary | ICD-10-CM

## 2023-12-18 DIAGNOSIS — R278 Other lack of coordination: Secondary | ICD-10-CM

## 2023-12-18 DIAGNOSIS — R262 Difficulty in walking, not elsewhere classified: Secondary | ICD-10-CM

## 2023-12-18 DIAGNOSIS — R2689 Other abnormalities of gait and mobility: Secondary | ICD-10-CM

## 2023-12-18 DIAGNOSIS — R531 Weakness: Secondary | ICD-10-CM

## 2023-12-18 DIAGNOSIS — M6281 Muscle weakness (generalized): Secondary | ICD-10-CM

## 2023-12-18 DIAGNOSIS — I69351 Hemiplegia and hemiparesis following cerebral infarction affecting right dominant side: Secondary | ICD-10-CM

## 2023-12-18 NOTE — Therapy (Unsigned)
 OUTPATIENT PHYSICAL THERAPY TREATMENT  Patient Name: Curtis Powell MRN: [id number] DOB:25-Feb-1966, 58 y.o., male Today's Date: 12/19/2023  PCP: Sherial Bail, MD  REFERRING PROVIDER: Sherial Bail, MD    PT End of Session - 12/18/23 1335     Visit Number 109    Number of Visits 130    Date for PT Re-Evaluation 02/24/24    Progress Note Due on Visit 110    PT Start Time 1314    PT Stop Time 1358    PT Time Calculation (min) 44 min    Equipment Utilized During Treatment Gait belt    Activity Tolerance Patient tolerated treatment well    Behavior During Therapy WFL for tasks assessed/performed             Past Medical History:  Diagnosis Date   Asthma    GERD (gastroesophageal reflux disease)    Hyperlipidemia    Hypertension    Obesity    Stroke [facility])    Past Surgical History:  Procedure Laterality Date   BRAIN SURGERY     Patient Active Problem List   Diagnosis Date Noted   Impaired glucose tolerance 03/23/2020   Learning disability 03/23/2020   Urinary and fecal incontinence 06/14/2019   Chronic pain syndrome 03/23/2019   Constipation 03/23/2019   Dysarthria 03/23/2019   Dysphagia 03/23/2019   Learning difficulty 03/23/2019   Morbid obesity (HCC) 03/23/2019   Muscle weakness 03/23/2019   Vitamin D deficiency 03/23/2019   Bilateral carotid artery stenosis 12/15/2018   Moderate aortic valve stenosis 12/15/2018   BMI 45.0-49.9, adult (HCC) 11/10/2018   Cerebrovascular accident (HCC) 05/02/2016   Hemiparesis affecting right side as late effect of cerebrovascular accident (HCC) 05/01/2016   Brain tumor (HCC) 11/24/2014   Gastro-esophageal reflux disease without esophagitis 11/24/2014   Accident due to mechanical fall without injury 08/03/2014   Essential hypertension 06/13/2014   Mixed hyperlipidemia 06/13/2014   Unspecified sequelae of cerebral infarction 06/13/2014   Thyromegaly 01/07/2014   Type 2 diabetes mellitus without complication  (HCC) 01/06/2014   Neck mass 09/08/2013   Swimmer's ear 09/08/2013   Erectile dysfunction 01/03/2013   Prediabetes 01/03/2013   ONSET DATE: 05/09/2016  REFERRING DIAG: P30.648 (ICD-10-CM) - Hemiplegia and hemiparesis following cerebral infarction affecting right dominant side   THERAPY DIAG:  Difficulty in walking, not elsewhere classified  Unsteadiness on feet  Muscle weakness (generalized)  Abnormality of gait and mobility  Left-sided weakness  Repeated falls  Other abnormalities of gait and mobility  Hemiplegia and hemiparesis following cerebral infarction affecting right dominant side (HCC)  Other lack of coordination   Rationale for Evaluation and Treatment Rehabilitation  SUBJECTIVE:  SUBJECTIVE STATEMENT  Patient reports feeling good and excited for his birthday this weekend.   Pt accompanied by: aunt  PERTINENT HISTORY:  The pt is a pleasant 58 yo male who returns to PT for evaluation of deficits s/o old CVA. Pt with hx of CVA Feb 2018. He was previously fully independent, but CVA caused significant deficits, mainly affecting his right side. Pt reports continued difficulty with walking and transfers. Pt still using WC for majority of mobility, otherwise he uses his RW. Pt lives with his mom who is his main caregiver. Patient had outpatient PT previously in clinic in 2023. He reports since discharge he has been completing his HEP and walking about every day. Pt would like to improve his walking mainly, but also would like to work on balance. He also reports difficulty with transfers. Pt's mom and his caregiver helps him with ADLs. However, he says he is now waiting for a new caregiver. Prior to this pt had caregiver 3x/week. PMH significant for hx of brain tumor, past CVA, DMII and HTN.  See chart for additional information.  PAIN:  Are you having pain? Pt reports no pain currently  PRECAUTIONS: fall  WEIGHT BEARING RESTRICTIONS none  PLOF: required assistance with ADLs (old CVA), prior to CVA in 2018 was indep.  History of Falls: Pt reports no recent falls  Home information: Pt lives with mother who is his primary caregiver. Previously had home aide, 3x/week but reports currently waiting for new one.  PATIENT GOALS: Pt would like to improve his ability to ambulate.  OBJECTIVE:  taken at [facility] unless otherwise noted:   DIAGNOSTIC FINDINGS: No recent imaging per chart  Objective measures taken at eval unless otherwise specified:   Posture: forward head posture, rounded shoulders  MMT LE: grossly  4+/5, exception hip flexors and abductors 4/5 each bilat   Functional tests:  5xSTS:  34 sec, use of BUE (multiple initial attempts with posterior LOB in chair)   : 0.096 m/s with RW, close CGA, and WC follow  Gait for distance (goal 150 ft): deferred due to time  Gait mechanics: impaired, decreased speed (see ), crouched posture, poor postural stability and relies heavily on BUE support on RW. Pt now with bilat AFOS   FOTO: 44 (goal 50)   TODAY'S TREATMENT 12/1123     TA- To improve functional movements patterns for everyday tasks   Static stand without UE support x 5 min Standing dynamic weight shifting L- R without UE support x 20 reps - VC to shift to R side. Dynamic weight shift activity- at support bar with dry erase boar- Reaching for magnet letters and then reaching diagonal/overhead to right then to left - back and forth for approx 20 min- Focusing on erect posture and dual task working on optimal Weight bearing through BLE. Sit to stand with (CGA to min A) 2 x 10 reps - Min VC for hand placement      PATIENT EDUCATION: Education details: If pt can lean pelvis into support surface, his energy efficicency will be higher.  Person  educated: Patient Education method: Explanation, Demonstration, Tactile cues, and Verbal cues Education comprehension: verbalized understanding, returned demonstration, and verbal cues required  HOME EXERCISE PROGRAM:  Standing hip flexor stretch at counter, prolonged standing with erect posture for gluteal activation, HS curls with RTB  Access Code: [email or web address] URL: [email or web address] Date: 05/15/2023 Prepared by: Connell Kiss  Exercises - Seated Long Arc Quad with Ankle Weight  - 1 x daily -  7 x weekly - 2 sets - 10 reps  GOALS: Goals reviewed with patient? No  SHORT TERM GOALS: Target date: 11/12/2022  Pt will be independent with HEP in order to improve strength and balance in order to decrease fall risk and improve function at home. Baseline: instructed in HEP today, to provide printout next visit Goal status: MET  LONG TERM GOALS: Target date: 02/24/2024  Pt will increase FOTO to at least 50 in order to demonstrate improvement in function related to mobility and QoL. Baseline: 44 7/29:48 9/10:40 12/24/2022= 51 Goal status: MET  2.  Pt (< 60 yrs old) will complete 5xSTS test < 30 seconds indicating an increase in LE strength and improved balance. Baseline: 10/01/22 34 sec (multiple initial attempts with posterior LOB in chair) 12/20: 49 seconds 11/04/22:1:06, min A on multiple attempts to maintain balance and pt stays in flexed posture throughout test when coming to standing.   9/10: 27.5 sec, Pushes WC post consistently, close CGA  10/22: 47 seconds first attempt 12/5: 1 minute, 37 seconds, multiple attempts to get first stand, but no difficulty after that; 03/20/2023= 1 min 2 sec with 1UE on walker and 1 on armrest- some posterior lean- Close CGA; 06/17/2023= 38 sec with 1 UE on walker and 1 on armrest; 08/21/2023= 35 sec with 1UE on walker and 1 on armrest.  8/5: 36 sec with 1 UE on walker 1 on arm rest  Goal status: ONGOING  3.  Pt will increase to > 0.5  m/s (30 sec) as to improve gait speed for better home ambulation and reduce fall risk. Baseline: 10/01/22 0.096 m/s with RW, close CGA, and WC follow previously 12/27: 0.11 m/s 11/04/22: .056 m/s  9/10: 076 m/s 10/22: 0.64 m/s  11/26: .076 m/s; 03/20/2023= 0.08 m/s; 04/22/2023= 0.089 m/s; 05/15/2023 = 0.079 m/s; 05/27/2023= 0.11 m/s; 07/17/2023= 0.15 m/s; 08/21/2023= 0.14 m/s avg with Bariatric walker; 09/09/2023= 0.14 m/s; 10/02/2023= 0.09 m/s with Bariatric walker and Increased cues to swing RLE through vs. Previous sesisons. 11/13/2023= 0.12 m/s with Bariatric walker; 12/11/2023= 0.14 m/s Goal status: ONGOING  4.  Pt will increase hip flexor and abductor  gross strength to 4+/5 bilat as to improve functional strength for independent gait, increased standing tolerance, and increased ADL ability. Baseline: 10/01/22 4+/5, exception hip flexors and abductors 4/5 each bilat  (previously 03/27/22: 4+/5 ) 9/10: Hip flexor 4/5, hip abductor 4+/5 10/22: hip flexor 4/5 hip abductor 4+/5 11/26: hip flexor 4+/5 hip abductor 4+/5 Goal status: MET  5.  Pt will ambulate 150 ft with BRW and WC follow with no rest breaks in less than 12 min to allow for improved mobility within home and increased independence. Baseline: 10/03/22 150' with BRW 04/03/22: 105 ft with BRW 7/29: 100 in 9:30 with pediatric RW 9/10:101 ft with BRW stooped posture worsened throughout test 12/24/2022- 150 feet using BRW, CGA with w/c follow- in 10 min 17 sec. Will keep goal to make sure he can do this consistently 10/22: 119 ft with BRW 8 min 23 seconds terminated by patient  11/26: 148 ft with BRW 14 min 34 seconds terminated by patient 03/20/2023= 152 feet using BRW, CGA and w/c follow in 14 min 3 sec- Need to keep goal active to ensure consistency but will revised and add a time element to goal. 04/22/2023= 162 feet in 11 min 54 sec using bariatric RW, CGA and close CGA assist; 05/27/2023- 170 feet without rest break using bariatric RW. 06/17/2023= 160  feet with  bariatric 4WW in 9 min- stopped due to fatigue.  Goal status: MET   6. Pt will decrease TUG to below 1 min 30 seconds/decrease in order to demonstrate decreased fall risk. Baseline: 06/17/2023= 2 min using bariatric 4WW; 09/09/2023= 1 min 50 sec with bariatric front wheeled walker. 12/02/2023= 1 min 44 sec  Goal status: PROGRESSING  7. Pt will ambulate 300 ft with BRW, CGA and WC follow with no rest breaks in less than 20 min to allow for improved mobility within home/community and increased independence. Baseline: 06/17/2023= 160 feet with bariatric 4WW in 9 min- stopped due to fatigue. 07/17/2023- Patient able to walk 270 feet in > 20 min with BRW, CGA and close w/c follow today- VC for posture and for gait sequencing; 08/21/2023- Not specifically testing but patient has been walking 160 feet or more each Visit; 09/09/2023= Patient ambulated 180 feet overall at one time today using bariatric RW, CGA with VC for erect posture and for increased step length. 10/02/2023= 165 feet with Bariatric RW- close w/c follow and CGA. More diffiuclty with gait sequencing more consistent VC for erect posture. 12/02/2023- Paitent ambulated >100 feet in // bars today but will reassess using walker next visit. 12/11/2023= Patient ambulated 204 feet with BRW and Close CGA and w/c follow Goal Status: PROGRESSING  8. Patient will report ability to walk out his front door including stepping over threshold using his walker with min A of caregiver to enable him to be more independent with household mobility and be able to go out to porch with less dependence on caregivers. Baseline: Patient dependent on caregivers to push him in his wheelchair to go out to porch and reports dependent on them to always assist so he doesn't go outside like he would like to. Goal status: NEW  ASSESSMENT :  CLINICAL IMPRESSION:   Patient required less overall VC to stand erect and no LOB with dynamic reaching. Less overall VC for sit to stand  transfers and patient able to stand more erect with transfers with less posterior leaning tendency. Pt will continue to benefit from skilled physical therapy intervention to address impairments, improve QOL, and attain therapy goals.     OBJECTIVE IMPAIRMENTS Abnormal gait, decreased activity tolerance, decreased balance, decreased coordination, decreased endurance, decreased mobility, difficulty walking, decreased ROM, decreased strength, improper body mechanics, postural dysfunction, and obesity.   ACTIVITY LIMITATIONS carrying, lifting, bending, sitting, standing, squatting, stairs, transfers, bed mobility, continence, bathing, toileting, dressing, hygiene/grooming, and locomotion level  PARTICIPATION LIMITATIONS: meal prep, cleaning, laundry, interpersonal relationship, driving, shopping, community activity, and yard work  PERSONAL FACTORS Fitness, Past/current experiences, Time since onset of injury/illness/exacerbation, and 3+ comorbidities: hx of brain tumor, past CVA, HTN, obesity, learning disability, DMII are also affecting patient's functional outcome.   REHAB POTENTIAL: Fair    CLINICAL DECISION MAKING: Stable/uncomplicated  EVALUATION COMPLEXITY: Low  PLAN: PT FREQUENCY: 1-2x/week  PT DURATION: 12 weeks  PLANNED INTERVENTIONS: Therapeutic exercises, Therapeutic activity, Neuromuscular re-education, Balance training, Gait training, Patient/Family education, Joint mobilization, Stair training, Vestibular training, Orthotic/Fit training, DME instructions, Dry Needling, Electrical stimulation, Wheelchair mobility training, Cryotherapy, Moist heat, Compression bandaging, Manual therapy, and Re-evaluation  PLAN FOR NEXT SESSION:   -Bridging and review of some bed LE strengthening -Continue with functional strengthening exercises of the LE's  -Gait activities using RW. Incorporate proper technique with all transfers to decrease his risk of falling.   Reyes LOISE London PT   Physical Therapist- [place]  [facility]   10:05  AM 12/19/23    OUTPATIENT PHYSICAL THERAPY TREATMENT  Patient Name: Curtis Powell MRN: [id number] DOB:December 03, 1965, 58 y.o., male Today's Date: 12/19/2023  PCP: Sherial Bail, MD  REFERRING PROVIDER: Sherial Bail, MD    PT End of Session - 12/18/23 1335     Visit Number 109    Number of Visits 130    Date for PT Re-Evaluation 02/24/24    Progress Note Due on Visit 110    PT Start Time 1314    PT Stop Time 1358    PT Time Calculation (min) 44 min    Equipment Utilized During Treatment Gait belt    Activity Tolerance Patient tolerated treatment well    Behavior During Therapy WFL for tasks assessed/performed             Past Medical History:  Diagnosis Date   Asthma    GERD (gastroesophageal reflux disease)    Hyperlipidemia    Hypertension    Obesity    Stroke [facility])    Past Surgical History:  Procedure Laterality Date   BRAIN SURGERY     Patient Active Problem List   Diagnosis Date Noted   Impaired glucose tolerance 03/23/2020   Learning disability 03/23/2020   Urinary and fecal incontinence 06/14/2019   Chronic pain syndrome 03/23/2019   Constipation 03/23/2019   Dysarthria 03/23/2019   Dysphagia 03/23/2019   Learning difficulty 03/23/2019   Morbid obesity (HCC) 03/23/2019   Muscle weakness 03/23/2019   Vitamin D deficiency 03/23/2019   Bilateral carotid artery stenosis 12/15/2018   Moderate aortic valve stenosis 12/15/2018   BMI 45.0-49.9, adult (HCC) 11/10/2018   Cerebrovascular accident (HCC) 05/02/2016   Hemiparesis affecting right side as late effect of cerebrovascular accident (HCC) 05/01/2016   Brain tumor (HCC) 11/24/2014   Gastro-esophageal reflux disease without esophagitis 11/24/2014   Accident due to mechanical fall without injury 08/03/2014   Essential hypertension 06/13/2014   Mixed hyperlipidemia 06/13/2014   Unspecified sequelae of cerebral  infarction 06/13/2014   Thyromegaly 01/07/2014   Type 2 diabetes mellitus without complication (HCC) 01/06/2014   Neck mass 09/08/2013   Swimmer's ear 09/08/2013   Erectile dysfunction 01/03/2013   Prediabetes 01/03/2013   ONSET DATE: 05/09/2016  REFERRING DIAG: P30.648 (ICD-10-CM) - Hemiplegia and hemiparesis following cerebral infarction affecting right dominant side   THERAPY DIAG:  Difficulty in walking, not elsewhere classified  Unsteadiness on feet  Muscle weakness (generalized)  Abnormality of gait and mobility  Left-sided weakness  Repeated falls  Other abnormalities of gait and mobility  Hemiplegia and hemiparesis following cerebral infarction affecting right dominant side (HCC)  Other lack of coordination   Rationale for Evaluation and Treatment Rehabilitation  SUBJECTIVE:  SUBJECTIVE STATEMENT  Patient reports doing well overall with no new issues.    Pt accompanied by: aunt  PERTINENT HISTORY:  The pt is a pleasant 57 yo male who returns to PT for evaluation of deficits s/o old CVA. Pt with hx of CVA Feb 2018. He was previously fully independent, but CVA caused significant deficits, mainly affecting his right side. Pt reports continued difficulty with walking and transfers. Pt still using WC for majority of mobility, otherwise he uses his RW. Pt lives with his mom who is his main caregiver. Patient had outpatient PT previously in clinic in 2023. He reports since discharge he has been completing his HEP and walking about every day. Pt would like to improve his walking mainly, but also would like to work on balance. He also reports difficulty with transfers. Pt's mom and his caregiver helps him with ADLs. However, he says he is now waiting for a new caregiver. Prior to this pt had  caregiver 3x/week. PMH significant for hx of brain tumor, past CVA, DMII and HTN. See chart for additional information.  PAIN:  Are you having pain? Pt reports no pain currently  PRECAUTIONS: fall  WEIGHT BEARING RESTRICTIONS none  PLOF: required assistance with ADLs (old CVA), prior to CVA in 2018 was indep.  History of Falls: Pt reports no recent falls  Home information: Pt lives with mother who is his primary caregiver. Previously had home aide, 3x/week but reports currently waiting for new one.  PATIENT GOALS: Pt would like to improve his ability to ambulate.  OBJECTIVE:  taken at [facility] unless otherwise noted:   DIAGNOSTIC FINDINGS: No recent imaging per chart  Objective measures taken at eval unless otherwise specified:   Posture: forward head posture, rounded shoulders  MMT LE: grossly  4+/5, exception hip flexors and abductors 4/5 each bilat   Functional tests:  5xSTS:  34 sec, use of BUE (multiple initial attempts with posterior LOB in chair)   : 0.096 m/s with RW, close CGA, and WC follow  Gait for distance (goal 150 ft): deferred due to time  Gait mechanics: impaired, decreased speed (see ), crouched posture, poor postural stability and relies heavily on BUE support on RW. Pt now with bilat AFOS   FOTO: 44 (goal 50)   TODAY'S TREATMENT 12/16/23     TA- To improve functional movements patterns for everyday tasks   Static stand without UE support x 5 min Standing dynamic activity - arranging magnet letters into appropriate square using 1 UE for support on support bar while opp UE reaching overhead an dmoving letter into corresponding shapy on dry erase board- focusing on erect standing, dynamic weight shifting L- R, and 1 UE support with dynamic UE reaching. X 25 min Sit to stand from w/c with 1 hand on armrest and opp UE pulling up from support bar today with (SBA)  3 sets x 10 reps - brief rest between each Set     PATIENT EDUCATION: Education  details: If pt can lean pelvis into support surface, his energy efficicency will be higher.  Person educated: Patient Education method: Explanation, Demonstration, Tactile cues, and Verbal cues Education comprehension: verbalized understanding, returned demonstration, and verbal cues required  HOME EXERCISE PROGRAM:  Standing hip flexor stretch at counter, prolonged standing with erect posture for gluteal activation, HS curls with RTB  Access Code: [email or web address] URL: [email or web address] Date: 05/15/2023 Prepared by: Connell Kiss  Exercises - Seated Long Arc Quad with Ankle Weight  - 1 x daily -  7 x weekly - 2 sets - 10 reps  GOALS: Goals reviewed with patient? No  SHORT TERM GOALS: Target date: 11/12/2022  Pt will be independent with HEP in order to improve strength and balance in order to decrease fall risk and improve function at home. Baseline: instructed in HEP today, to provide printout next visit Goal status: MET  LONG TERM GOALS: Target date: 02/24/2024  Pt will increase FOTO to at least 50 in order to demonstrate improvement in function related to mobility and QoL. Baseline: 44 7/29:48 9/10:40 12/24/2022= 51 Goal status: MET  2.  Pt (< 60 yrs old) will complete 5xSTS test < 30 seconds indicating an increase in LE strength and improved balance. Baseline: 10/01/22 34 sec (multiple initial attempts with posterior LOB in chair) 12/20: 49 seconds 11/04/22:1:06, min A on multiple attempts to maintain balance and pt stays in flexed posture throughout test when coming to standing.   9/10: 27.5 sec, Pushes WC post consistently, close CGA  10/22: 47 seconds first attempt 12/5: 1 minute, 37 seconds, multiple attempts to get first stand, but no difficulty after that; 03/20/2023= 1 min 2 sec with 1UE on walker and 1 on armrest- some posterior lean- Close CGA; 06/17/2023= 38 sec with 1 UE on walker and 1 on armrest; 08/21/2023= 35 sec with 1UE on walker and 1 on armrest.  8/5: 36  sec with 1 UE on walker 1 on arm rest  Goal status: ONGOING  3.  Pt will increase to > 0.5 m/s (30 sec) as to improve gait speed for better home ambulation and reduce fall risk. Baseline: 10/01/22 0.096 m/s with RW, close CGA, and WC follow previously 12/27: 0.11 m/s 11/04/22: .056 m/s  9/10: 076 m/s 10/22: 0.64 m/s  11/26: .076 m/s; 03/20/2023= 0.08 m/s; 04/22/2023= 0.089 m/s; 05/15/2023 = 0.079 m/s; 05/27/2023= 0.11 m/s; 07/17/2023= 0.15 m/s; 08/21/2023= 0.14 m/s avg with Bariatric walker; 09/09/2023= 0.14 m/s; 10/02/2023= 0.09 m/s with Bariatric walker and Increased cues to swing RLE through vs. Previous sesisons. 11/13/2023= 0.12 m/s with Bariatric walker; 12/11/2023= 0.14 m/s Goal status: ONGOING  4.  Pt will increase hip flexor and abductor  gross strength to 4+/5 bilat as to improve functional strength for independent gait, increased standing tolerance, and increased ADL ability. Baseline: 10/01/22 4+/5, exception hip flexors and abductors 4/5 each bilat  (previously 03/27/22: 4+/5 ) 9/10: Hip flexor 4/5, hip abductor 4+/5 10/22: hip flexor 4/5 hip abductor 4+/5 11/26: hip flexor 4+/5 hip abductor 4+/5 Goal status: MET  5.  Pt will ambulate 150 ft with BRW and WC follow with no rest breaks in less than 12 min to allow for improved mobility within home and increased independence. Baseline: 10/03/22 150' with BRW 04/03/22: 105 ft with BRW 7/29: 100 in 9:30 with pediatric RW 9/10:101 ft with BRW stooped posture worsened throughout test 12/24/2022- 150 feet using BRW, CGA with w/c follow- in 10 min 17 sec. Will keep goal to make sure he can do this consistently 10/22: 119 ft with BRW 8 min 23 seconds terminated by patient  11/26: 148 ft with BRW 14 min 34 seconds terminated by patient 03/20/2023= 152 feet using BRW, CGA and w/c follow in 14 min 3 sec- Need to keep goal active to ensure consistency but will revised and add a time element to goal. 04/22/2023= 162 feet in 11 min 54 sec using bariatric RW,  CGA and close CGA assist; 05/27/2023- 170 feet without rest break using bariatric RW. 06/17/2023= 160 feet with  bariatric 4WW in 9 min- stopped due to fatigue.  Goal status: MET   6. Pt will decrease TUG to below 1 min 30 seconds/decrease in order to demonstrate decreased fall risk. Baseline: 06/17/2023= 2 min using bariatric 4WW; 09/09/2023= 1 min 50 sec with bariatric front wheeled walker. 12/02/2023= 1 min 44 sec  Goal status: PROGRESSING  7. Pt will ambulate 300 ft with BRW, CGA and WC follow with no rest breaks in less than 20 min to allow for improved mobility within home/community and increased independence. Baseline: 06/17/2023= 160 feet with bariatric 4WW in 9 min- stopped due to fatigue. 07/17/2023- Patient able to walk 270 feet in > 20 min with BRW, CGA and close w/c follow today- VC for posture and for gait sequencing; 08/21/2023- Not specifically testing but patient has been walking 160 feet or more each Visit; 09/09/2023= Patient ambulated 180 feet overall at one time today using bariatric RW, CGA with VC for erect posture and for increased step length. 10/02/2023= 165 feet with Bariatric RW- close w/c follow and CGA. More diffiuclty with gait sequencing more consistent VC for erect posture. 12/02/2023- Paitent ambulated >100 feet in // bars today but will reassess using walker next visit. 12/11/2023= Patient ambulated 204 feet with BRW and Close CGA and w/c follow Goal Status: PROGRESSING  8. Patient will report ability to walk out his front door including stepping over threshold using his walker with min A of caregiver to enable him to be more independent with household mobility and be able to go out to porch with less dependence on caregivers. Baseline: Patient dependent on caregivers to push him in his wheelchair to go out to porch and reports dependent on them to always assist so he doesn't go outside like he would like to. Goal status: NEW  ASSESSMENT :  CLINICAL IMPRESSION:   Patient presents  with continual progression with overall standing with more erect standing during activities with decreased overall assistance. He was able to participate in prolonged activities with some dynamic reaching overhead with each UE well today.   Pt will continue to benefit from skilled physical therapy intervention to address impairments, improve QOL, and attain therapy goals.     OBJECTIVE IMPAIRMENTS Abnormal gait, decreased activity tolerance, decreased balance, decreased coordination, decreased endurance, decreased mobility, difficulty walking, decreased ROM, decreased strength, improper body mechanics, postural dysfunction, and obesity.   ACTIVITY LIMITATIONS carrying, lifting, bending, sitting, standing, squatting, stairs, transfers, bed mobility, continence, bathing, toileting, dressing, hygiene/grooming, and locomotion level  PARTICIPATION LIMITATIONS: meal prep, cleaning, laundry, interpersonal relationship, driving, shopping, community activity, and yard work  PERSONAL FACTORS Fitness, Past/current experiences, Time since onset of injury/illness/exacerbation, and 3+ comorbidities: hx of brain tumor, past CVA, HTN, obesity, learning disability, DMII are also affecting patient's functional outcome.   REHAB POTENTIAL: Fair    CLINICAL DECISION MAKING: Stable/uncomplicated  EVALUATION COMPLEXITY: Low  PLAN: PT FREQUENCY: 1-2x/week  PT DURATION: 12 weeks  PLANNED INTERVENTIONS: Therapeutic exercises, Therapeutic activity, Neuromuscular re-education, Balance training, Gait training, Patient/Family education, Joint mobilization, Stair training, Vestibular training, Orthotic/Fit training, DME instructions, Dry Needling, Electrical stimulation, Wheelchair mobility training, Cryotherapy, Moist heat, Compression bandaging, Manual therapy, and Re-evaluation  PLAN FOR NEXT SESSION:   -Bridging and review of some bed LE strengthening -Continue with functional strengthening exercises of the LE's   -Gait activities using RW. Incorporate proper technique with all transfers to decrease his risk of falling.   Reyes LOISE London PT  Physical Therapist- [place]  [facility]  10:05 AM 12/19/23

## 2023-12-23 ENCOUNTER — Ambulatory Visit

## 2023-12-23 DIAGNOSIS — M6281 Muscle weakness (generalized): Secondary | ICD-10-CM

## 2023-12-23 DIAGNOSIS — R2689 Other abnormalities of gait and mobility: Secondary | ICD-10-CM

## 2023-12-23 DIAGNOSIS — R262 Difficulty in walking, not elsewhere classified: Secondary | ICD-10-CM

## 2023-12-23 DIAGNOSIS — R269 Unspecified abnormalities of gait and mobility: Secondary | ICD-10-CM

## 2023-12-23 DIAGNOSIS — R2681 Unsteadiness on feet: Secondary | ICD-10-CM | POA: Diagnosis not present

## 2023-12-23 DIAGNOSIS — R531 Weakness: Secondary | ICD-10-CM

## 2023-12-23 DIAGNOSIS — R296 Repeated falls: Secondary | ICD-10-CM

## 2023-12-23 NOTE — Therapy (Signed)
 OUTPATIENT PHYSICAL THERAPY TREATMENT/Physical Therapy Progress Note   Dates of reporting period  11/11/2023   to   12/23/2023   Patient Name: Curtis Powell MRN: [id number] DOB:1965-10-28, 58 y.o., male Today's Date: 12/23/2023  PCP: Sherial Bail, MD  REFERRING PROVIDER: Sherial Bail, MD    PT End of Session - 12/23/23 1324     Visit Number 110    Number of Visits 130    Date for PT Re-Evaluation 02/24/24    Progress Note Due on Visit 110    PT Start Time 1315    PT Stop Time 1358    PT Time Calculation (min) 43 min    Equipment Utilized During Treatment Gait belt    Activity Tolerance Patient tolerated treatment well    Behavior During Therapy WFL for tasks assessed/performed              Past Medical History:  Diagnosis Date   Asthma    GERD (gastroesophageal reflux disease)    Hyperlipidemia    Hypertension    Obesity    Stroke [facility])    Past Surgical History:  Procedure Laterality Date   BRAIN SURGERY     Patient Active Problem List   Diagnosis Date Noted   Impaired glucose tolerance 03/23/2020   Learning disability 03/23/2020   Urinary and fecal incontinence 06/14/2019   Chronic pain syndrome 03/23/2019   Constipation 03/23/2019   Dysarthria 03/23/2019   Dysphagia 03/23/2019   Learning difficulty 03/23/2019   Morbid obesity (HCC) 03/23/2019   Muscle weakness 03/23/2019   Vitamin D deficiency 03/23/2019   Bilateral carotid artery stenosis 12/15/2018   Moderate aortic valve stenosis 12/15/2018   BMI 45.0-49.9, adult (HCC) 11/10/2018   Cerebrovascular accident (HCC) 05/02/2016   Hemiparesis affecting right side as late effect of cerebrovascular accident (HCC) 05/01/2016   Brain tumor (HCC) 11/24/2014   Gastro-esophageal reflux disease without esophagitis 11/24/2014   Accident due to mechanical fall without injury 08/03/2014   Essential hypertension 06/13/2014   Mixed hyperlipidemia 06/13/2014   Unspecified sequelae of cerebral  infarction 06/13/2014   Thyromegaly 01/07/2014   Type 2 diabetes mellitus without complication (HCC) 01/06/2014   Neck mass 09/08/2013   Swimmer's ear 09/08/2013   Erectile dysfunction 01/03/2013   Prediabetes 01/03/2013   ONSET DATE: 05/09/2016  REFERRING DIAG: P30.648 (ICD-10-CM) - Hemiplegia and hemiparesis following cerebral infarction affecting right dominant side   THERAPY DIAG:  Difficulty in walking, not elsewhere classified  Unsteadiness on feet  Muscle weakness (generalized)  Abnormality of gait and mobility  Left-sided weakness  Repeated falls  Other abnormalities of gait and mobility   Rationale for Evaluation and Treatment Rehabilitation  SUBJECTIVE:  SUBJECTIVE STATEMENT  Patient reports no new issues and reports celebrated his birthday over the weekend.   Pt accompanied by: aunt  PERTINENT HISTORY:  The pt is a pleasant 58 yo male who returns to PT for evaluation of deficits s/o old CVA. Pt with hx of CVA Feb 2018. He was previously fully independent, but CVA caused significant deficits, mainly affecting his right side. Pt reports continued difficulty with walking and transfers. Pt still using WC for majority of mobility, otherwise he uses his RW. Pt lives with his mom who is his main caregiver. Patient had outpatient PT previously in clinic in 2023. He reports since discharge he has been completing his HEP and walking about every day. Pt would like to improve his walking mainly, but also would like to work on balance. He also reports difficulty with transfers. Pt's mom and his caregiver helps him with ADLs. However, he says he is now waiting for a new caregiver. Prior to this pt had caregiver 3x/week. PMH significant for hx of brain tumor, past CVA, DMII and HTN. See chart for  additional information.  PAIN:  Are you having pain? Pt reports no pain currently  PRECAUTIONS: fall  WEIGHT BEARING RESTRICTIONS none  PLOF: required assistance with ADLs (old CVA), prior to CVA in 2018 was indep.  History of Falls: Pt reports no recent falls  Home information: Pt lives with mother who is his primary caregiver. Previously had home aide, 3x/week but reports currently waiting for new one.  PATIENT GOALS: Pt would like to improve his ability to ambulate.  OBJECTIVE:  taken at [facility] unless otherwise noted:   DIAGNOSTIC FINDINGS: No recent imaging per chart  Objective measures taken at eval unless otherwise specified:   Posture: forward head posture, rounded shoulders  MMT LE: grossly  4+/5, exception hip flexors and abductors 4/5 each bilat   Functional tests:  5xSTS:  34 sec, use of BUE (multiple initial attempts with posterior LOB in chair)   : 0.096 m/s with RW, close CGA, and WC follow  Gait for distance (goal 150 ft): deferred due to time  Gait mechanics: impaired, decreased speed (see ), crouched posture, poor postural stability and relies heavily on BUE support on RW. Pt now with bilat AFOS   FOTO: 44 (goal 50)   TODAY'S TREATMENT : 12/23/23    Physical therapy treatment session today consisted of completing assessment of goals and administration of testing as demonstrated and documented in flow sheet, treatment, and goals section of this note. Addition treatments may be found below.    Pt performed 5 time sit<>stand (5xSTS): 37.05 sec with some posterior LOB- CGA to min A to correct.  sec (>15 sec indicates increased fall risk)    10 Meter Walk Test: Patient instructed to walk 10 meters (32.8 ft) as quickly and as safely as possible at their normal speed x2 and at a fast speed x2. Time measured from 2 meter mark to 8 meter mark to accommodate ramp-up and ramp-down.  Normal speed 1: 0.14  m/s Normal speed 2: 0.13 m/s Average Normal  speed: 0.135 m/s  Cut off scores: <0.4 m/s = household Ambulator, 0.4-0.8 m/s = limited community Ambulator, >0.8 m/s = community Ambulator, >1.2 m/s = crossing a street, <1.0 = increased fall risk MCID 0.05 m/s (small), 0.13 m/s (moderate), 0.06 m/s (significant)  (ANPTA Core Set of Outcome Measures for Adults with Neurologic Conditions, 2018)   PT instructed pt in TUG: 2 min 24 sec and 1 min  52 sec= 2 min 8 sec avg (average of 2 trials; >13.5 sec indicates increased fall risk)   TA:  Static stand without UE Support x 4 min focusing on erect posture.   Self care/ Home management:   Review of importance of standing/walking daily.  Discussed importance of sit to stand transfers daily to work on consistency.       PATIENT EDUCATION: Education details: If pt can lean pelvis into support surface, his energy efficicency will be higher.  Person educated: Patient Education method: Explanation, Demonstration, Tactile cues, and Verbal cues Education comprehension: verbalized understanding, returned demonstration, and verbal cues required  HOME EXERCISE PROGRAM:  Standing hip flexor stretch at counter, prolonged standing with erect posture for gluteal activation, HS curls with RTB  Access Code: [email or web address] URL: [email or web address] Date: 05/15/2023 Prepared by: Connell Kiss  Exercises - Seated Long Arc Quad with Ankle Weight  - 1 x daily - 7 x weekly - 2 sets - 10 reps  GOALS: Goals reviewed with patient? No  SHORT TERM GOALS: Target date: 11/12/2022  Pt will be independent with HEP in order to improve strength and balance in order to decrease fall risk and improve function at home. Baseline: instructed in HEP today, to provide printout next visit Goal status: MET  LONG TERM GOALS: Target date: 02/24/2024  Pt will increase FOTO to at least 50 in order to demonstrate improvement in function related to mobility and QoL. Baseline: 44 7/29:48 9/10:40 12/24/2022=  51 Goal status: MET  2.  Pt (< 60 yrs old) will complete 5xSTS test < 30 seconds indicating an increase in LE strength and improved balance. Baseline: 10/01/22 34 sec (multiple initial attempts with posterior LOB in chair) 12/20: 49 seconds 11/04/22:1:06, min A on multiple attempts to maintain balance and pt stays in flexed posture throughout test when coming to standing.   9/10: 27.5 sec, Pushes WC post consistently, close CGA  10/22: 47 seconds first attempt 12/5: 1 minute, 37 seconds, multiple attempts to get first stand, but no difficulty after that; 03/20/2023= 1 min 2 sec with 1UE on walker and 1 on armrest- some posterior lean- Close CGA; 06/17/2023= 38 sec with 1 UE on walker and 1 on armrest; 08/21/2023= 35 sec with 1UE on walker and 1 on armrest.  8/5: 36 sec with 1 UE on walker 1 on arm rest; 12/23/2023= 37.05 sec with some posterior LOB- CGA to min A to correct.  Goal status: ONGOING  3.  Pt will increase to > 0.5 m/s (30 sec) as to improve gait speed for better home ambulation and reduce fall risk. Baseline: 10/01/22 0.096 m/s with RW, close CGA, and WC follow previously 12/27: 0.11 m/s 11/04/22: .056 m/s  9/10: 076 m/s 10/22: 0.64 m/s  11/26: .076 m/s; 03/20/2023= 0.08 m/s; 04/22/2023= 0.089 m/s; 05/15/2023 = 0.079 m/s; 05/27/2023= 0.11 m/s; 07/17/2023= 0.15 m/s; 08/21/2023= 0.14 m/s avg with Bariatric walker; 09/09/2023= 0.14 m/s; 10/02/2023= 0.09 m/s with Bariatric walker and Increased cues to swing RLE through vs. Previous sesisons. 11/13/2023= 0.12 m/s with Bariatric walker; 12/11/2023= 0.14 m/s; 12/23/2023=0.135 m/s Goal status: ONGOING  4.  Pt will increase hip flexor and abductor  gross strength to 4+/5 bilat as to improve functional strength for independent gait, increased standing tolerance, and increased ADL ability. Baseline: 10/01/22 4+/5, exception hip flexors and abductors 4/5 each bilat  (previously 03/27/22: 4+/5 ) 9/10: Hip flexor 4/5, hip abductor 4+/5 10/22: hip flexor 4/5 hip  abductor 4+/5 11/26: hip flexor 4+/5 hip abductor  4+/5 Goal status: MET  5.  Pt will ambulate 150 ft with BRW and WC follow with no rest breaks in less than 12 min to allow for improved mobility within home and increased independence. Baseline: 10/03/22 150' with BRW 04/03/22: 105 ft with BRW 7/29: 100 in 9:30 with pediatric RW 9/10:101 ft with BRW stooped posture worsened throughout test 12/24/2022- 150 feet using BRW, CGA with w/c follow- in 10 min 17 sec. Will keep goal to make sure he can do this consistently 10/22: 119 ft with BRW 8 min 23 seconds terminated by patient  11/26: 148 ft with BRW 14 min 34 seconds terminated by patient 03/20/2023= 152 feet using BRW, CGA and w/c follow in 14 min 3 sec- Need to keep goal active to ensure consistency but will revised and add a time element to goal. 04/22/2023= 162 feet in 11 min 54 sec using bariatric RW, CGA and close CGA assist; 05/27/2023- 170 feet without rest break using bariatric RW. 06/17/2023= 160 feet with bariatric 4WW in 9 min- stopped due to fatigue.  Goal status: MET   6. Pt will decrease TUG to below 1 min 30 seconds/decrease in order to demonstrate decreased fall risk. Baseline: 06/17/2023= 2 min using bariatric 4WW; 09/09/2023= 1 min 50 sec with bariatric front wheeled walker. 12/02/2023= 1 min 44 sec; 12/23/2023= 2 min 8 sec avg with BRW Goal status: ONGOING  7. Pt will ambulate 300 ft with BRW, CGA and WC follow with no rest breaks in less than 20 min to allow for improved mobility within home/community and increased independence. Baseline: 06/17/2023= 160 feet with bariatric 4WW in 9 min- stopped due to fatigue. 07/17/2023- Patient able to walk 270 feet in > 20 min with BRW, CGA and close w/c follow today- VC for posture and for gait sequencing; 08/21/2023- Not specifically testing but patient has been walking 160 feet or more each Visit; 09/09/2023= Patient ambulated 180 feet overall at one time today using bariatric RW, CGA with VC for erect  posture and for increased step length. 10/02/2023= 165 feet with Bariatric RW- close w/c follow and CGA. More diffiuclty with gait sequencing more consistent VC for erect posture. 12/02/2023- Paitent ambulated >100 feet in // bars today but will reassess using walker next visit. 12/11/2023= Patient ambulated 204 feet with BRW and Close CGA and w/c follow Goal Status: PROGRESSING  8. Patient will report ability to walk out his front door including stepping over threshold using his walker with min A of caregiver to enable him to be more independent with household mobility and be able to go out to porch with less dependence on caregivers. Baseline: Patient dependent on caregivers to push him in his wheelchair to go out to porch and reports dependent on them to always assist so he doesn't go outside like he would like to. Goal status: NEW  ASSESSMENT :  CLINICAL IMPRESSION:   Patient presents with good motivation for today's progress report visit. He was initially slower with movement experienced some lag with turning ability with TUG today. This aspect will be a focus in upcoming subsequent visit to improve safety and efficiency. He scored about the same with 5 x STS and 10 MWT test. Overall he making some progress yet struggled some with standing more than usual and may benefit from retesting soon to reflect his overall progress. Patient's condition has the potential to improve in response to therapy. Maximum improvement is yet to be obtained. The anticipated improvement is attainable and reasonable in  a generally predictable time.  Pt will continue to benefit from skilled physical therapy intervention to address impairments, improve QOL, and attain therapy goals.     OBJECTIVE IMPAIRMENTS Abnormal gait, decreased activity tolerance, decreased balance, decreased coordination, decreased endurance, decreased mobility, difficulty walking, decreased ROM, decreased strength, improper body mechanics, postural  dysfunction, and obesity.   ACTIVITY LIMITATIONS carrying, lifting, bending, sitting, standing, squatting, stairs, transfers, bed mobility, continence, bathing, toileting, dressing, hygiene/grooming, and locomotion level  PARTICIPATION LIMITATIONS: meal prep, cleaning, laundry, interpersonal relationship, driving, shopping, community activity, and yard work  PERSONAL FACTORS Fitness, Past/current experiences, Time since onset of injury/illness/exacerbation, and 3+ comorbidities: hx of brain tumor, past CVA, HTN, obesity, learning disability, DMII are also affecting patient's functional outcome.   REHAB POTENTIAL: Fair    CLINICAL DECISION MAKING: Stable/uncomplicated  EVALUATION COMPLEXITY: Low  PLAN: PT FREQUENCY: 1-2x/week  PT DURATION: 12 weeks  PLANNED INTERVENTIONS: Therapeutic exercises, Therapeutic activity, Neuromuscular re-education, Balance training, Gait training, Patient/Family education, Joint mobilization, Stair training, Vestibular training, Orthotic/Fit training, DME instructions, Dry Needling, Electrical stimulation, Wheelchair mobility training, Cryotherapy, Moist heat, Compression bandaging, Manual therapy, and Re-evaluation  PLAN FOR NEXT SESSION:   -Bridging and review of some bed LE strengthening -Continue with functional strengthening exercises of the LE's  -Gait activities using RW. Incorporate proper technique with all transfers to decrease his risk of falling.   Curtis Powell PT  Physical Therapist- [place]  [facility]   9:30 PM 12/23/23    OUTPATIENT PHYSICAL THERAPY TREATMENT  Patient Name: IMRE VECCHIONE MRN: [id number] DOB:02-16-1966, 58 y.o., male Today's Date: 12/23/2023  PCP: Sherial Bail, MD  REFERRING PROVIDER: Sherial Bail, MD    PT End of Session - 12/23/23 1324     Visit Number 110    Number of Visits 130    Date for PT Re-Evaluation 02/24/24    Progress Note Due on Visit 110    PT Start  Time 1315    PT Stop Time 1358    PT Time Calculation (min) 43 min    Equipment Utilized During Treatment Gait belt    Activity Tolerance Patient tolerated treatment well    Behavior During Therapy WFL for tasks assessed/performed              Past Medical History:  Diagnosis Date   Asthma    GERD (gastroesophageal reflux disease)    Hyperlipidemia    Hypertension    Obesity    Stroke [facility])    Past Surgical History:  Procedure Laterality Date   BRAIN SURGERY     Patient Active Problem List   Diagnosis Date Noted   Impaired glucose tolerance 03/23/2020   Learning disability 03/23/2020   Urinary and fecal incontinence 06/14/2019   Chronic pain syndrome 03/23/2019   Constipation 03/23/2019   Dysarthria 03/23/2019   Dysphagia 03/23/2019   Learning difficulty 03/23/2019   Morbid obesity (HCC) 03/23/2019   Muscle weakness 03/23/2019   Vitamin D deficiency 03/23/2019   Bilateral carotid artery stenosis 12/15/2018   Moderate aortic valve stenosis 12/15/2018   BMI 45.0-49.9, adult (HCC) 11/10/2018   Cerebrovascular accident (HCC) 05/02/2016   Hemiparesis affecting right side as late effect of cerebrovascular accident (HCC) 05/01/2016   Brain tumor (HCC) 11/24/2014   Gastro-esophageal reflux disease without esophagitis 11/24/2014   Accident due to mechanical fall without injury 08/03/2014   Essential hypertension 06/13/2014   Mixed hyperlipidemia 06/13/2014   Unspecified sequelae of cerebral infarction 06/13/2014   Thyromegaly 01/07/2014  Type 2 diabetes mellitus without complication (HCC) 01/06/2014   Neck mass 09/08/2013   Swimmer's ear 09/08/2013   Erectile dysfunction 01/03/2013   Prediabetes 01/03/2013   ONSET DATE: 05/09/2016  REFERRING DIAG: P30.648 (ICD-10-CM) - Hemiplegia and hemiparesis following cerebral infarction affecting right dominant side   THERAPY DIAG:  Difficulty in walking, not elsewhere classified  Unsteadiness on feet  Muscle weakness  (generalized)  Abnormality of gait and mobility  Left-sided weakness  Repeated falls  Other abnormalities of gait and mobility   Rationale for Evaluation and Treatment Rehabilitation  SUBJECTIVE:                                                                                                                                                                                             SUBJECTIVE STATEMENT  Patient reports doing well overall with no new issues.    Pt accompanied by: aunt  PERTINENT HISTORY:  The pt is a pleasant 58 yo male who returns to PT for evaluation of deficits s/o old CVA. Pt with hx of CVA Feb 2018. He was previously fully independent, but CVA caused significant deficits, mainly affecting his right side. Pt reports continued difficulty with walking and transfers. Pt still using WC for majority of mobility, otherwise he uses his RW. Pt lives with his mom who is his main caregiver. Patient had outpatient PT previously in clinic in 2023. He reports since discharge he has been completing his HEP and walking about every day. Pt would like to improve his walking mainly, but also would like to work on balance. He also reports difficulty with transfers. Pt's mom and his caregiver helps him with ADLs. However, he says he is now waiting for a new caregiver. Prior to this pt had caregiver 3x/week. PMH significant for hx of brain tumor, past CVA, DMII and HTN. See chart for additional information.  PAIN:  Are you having pain? Pt reports no pain currently  PRECAUTIONS: fall  WEIGHT BEARING RESTRICTIONS none  PLOF: required assistance with ADLs (old CVA), prior to CVA in 2018 was indep.  History of Falls: Pt reports no recent falls  Home information: Pt lives with mother who is his primary caregiver. Previously had home aide, 3x/week but reports currently waiting for new one.  PATIENT GOALS: Pt would like to improve his ability to ambulate.  OBJECTIVE:  taken at [facility]  unless otherwise noted:   DIAGNOSTIC FINDINGS: No recent imaging per chart  Objective measures taken at eval unless otherwise specified:   Posture: forward head posture, rounded shoulders  MMT LE: grossly  4+/5, exception hip flexors and abductors 4/5 each bilat  Functional tests:  5xSTS:  34 sec, use of BUE (multiple initial attempts with posterior LOB in chair)   : 0.096 m/s with RW, close CGA, and WC follow  Gait for distance (goal 150 ft): deferred due to time  Gait mechanics: impaired, decreased speed (see ), crouched posture, poor postural stability and relies heavily on BUE support on RW. Pt now with bilat AFOS   FOTO: 44 (goal 50)   TODAY'S TREATMENT 12/16/23     TA- To improve functional movements patterns for everyday tasks   Static stand without UE support x 5 min Standing dynamic activity - arranging magnet letters into appropriate square using 1 UE for support on support bar while opp UE reaching overhead an dmoving letter into corresponding shapy on dry erase board- focusing on erect standing, dynamic weight shifting L- R, and 1 UE support with dynamic UE reaching. X 25 min Sit to stand from w/c with 1 hand on armrest and opp UE pulling up from support bar today with (SBA)  3 sets x 10 reps - brief rest between each Set     PATIENT EDUCATION: Education details: If pt can lean pelvis into support surface, his energy efficicency will be higher.  Person educated: Patient Education method: Explanation, Demonstration, Tactile cues, and Verbal cues Education comprehension: verbalized understanding, returned demonstration, and verbal cues required  HOME EXERCISE PROGRAM:  Standing hip flexor stretch at counter, prolonged standing with erect posture for gluteal activation, HS curls with RTB  Access Code: [email or web address] URL: [email or web address] Date: 05/15/2023 Prepared by: Connell Kiss  Exercises - Seated Long Arc Quad with Ankle Weight  - 1  x daily - 7 x weekly - 2 sets - 10 reps  GOALS: Goals reviewed with patient? No  SHORT TERM GOALS: Target date: 11/12/2022  Pt will be independent with HEP in order to improve strength and balance in order to decrease fall risk and improve function at home. Baseline: instructed in HEP today, to provide printout next visit Goal status: MET  LONG TERM GOALS: Target date: 02/24/2024  Pt will increase FOTO to at least 50 in order to demonstrate improvement in function related to mobility and QoL. Baseline: 44 7/29:48 9/10:40 12/24/2022= 51 Goal status: MET  2.  Pt (< 60 yrs old) will complete 5xSTS test < 30 seconds indicating an increase in LE strength and improved balance. Baseline: 10/01/22 34 sec (multiple initial attempts with posterior LOB in chair) 12/20: 49 seconds 11/04/22:1:06, min A on multiple attempts to maintain balance and pt stays in flexed posture throughout test when coming to standing.   9/10: 27.5 sec, Pushes WC post consistently, close CGA  10/22: 47 seconds first attempt 12/5: 1 minute, 37 seconds, multiple attempts to get first stand, but no difficulty after that; 03/20/2023= 1 min 2 sec with 1UE on walker and 1 on armrest- some posterior lean- Close CGA; 06/17/2023= 38 sec with 1 UE on walker and 1 on armrest; 08/21/2023= 35 sec with 1UE on walker and 1 on armrest.  8/5: 36 sec with 1 UE on walker 1 on arm rest  Goal status: ONGOING  3.  Pt will increase to > 0.5 m/s (30 sec) as to improve gait speed for better home ambulation and reduce fall risk. Baseline: 10/01/22 0.096 m/s with RW, close CGA, and WC follow previously 12/27: 0.11 m/s 11/04/22: .056 m/s  9/10: 076 m/s 10/22: 0.64 m/s  11/26: .076 m/s; 03/20/2023= 0.08 m/s; 04/22/2023= 0.089 m/s; 05/15/2023 = 0.079 m/s; 05/27/2023= 0.11  m/s; 07/17/2023= 0.15 m/s; 08/21/2023= 0.14 m/s avg with Bariatric walker; 09/09/2023= 0.14 m/s; 10/02/2023= 0.09 m/s with Bariatric walker and Increased cues to swing RLE through vs. Previous  sesisons. 11/13/2023= 0.12 m/s with Bariatric walker; 12/11/2023= 0.14 m/s Goal status: ONGOING  4.  Pt will increase hip flexor and abductor  gross strength to 4+/5 bilat as to improve functional strength for independent gait, increased standing tolerance, and increased ADL ability. Baseline: 10/01/22 4+/5, exception hip flexors and abductors 4/5 each bilat  (previously 03/27/22: 4+/5 ) 9/10: Hip flexor 4/5, hip abductor 4+/5 10/22: hip flexor 4/5 hip abductor 4+/5 11/26: hip flexor 4+/5 hip abductor 4+/5 Goal status: MET  5.  Pt will ambulate 150 ft with BRW and WC follow with no rest breaks in less than 12 min to allow for improved mobility within home and increased independence. Baseline: 10/03/22 150' with BRW 04/03/22: 105 ft with BRW 7/29: 100 in 9:30 with pediatric RW 9/10:101 ft with BRW stooped posture worsened throughout test 12/24/2022- 150 feet using BRW, CGA with w/c follow- in 10 min 17 sec. Will keep goal to make sure he can do this consistently 10/22: 119 ft with BRW 8 min 23 seconds terminated by patient  11/26: 148 ft with BRW 14 min 34 seconds terminated by patient 03/20/2023= 152 feet using BRW, CGA and w/c follow in 14 min 3 sec- Need to keep goal active to ensure consistency but will revised and add a time element to goal. 04/22/2023= 162 feet in 11 min 54 sec using bariatric RW, CGA and close CGA assist; 05/27/2023- 170 feet without rest break using bariatric RW. 06/17/2023= 160 feet with bariatric 4WW in 9 min- stopped due to fatigue.  Goal status: MET   6. Pt will decrease TUG to below 1 min 30 seconds/decrease in order to demonstrate decreased fall risk. Baseline: 06/17/2023= 2 min using bariatric 4WW; 09/09/2023= 1 min 50 sec with bariatric front wheeled walker. 12/02/2023= 1 min 44 sec  Goal status: PROGRESSING  7. Pt will ambulate 300 ft with BRW, CGA and WC follow with no rest breaks in less than 20 min to allow for improved mobility within home/community and increased  independence. Baseline: 06/17/2023= 160 feet with bariatric 4WW in 9 min- stopped due to fatigue. 07/17/2023- Patient able to walk 270 feet in > 20 min with BRW, CGA and close w/c follow today- VC for posture and for gait sequencing; 08/21/2023- Not specifically testing but patient has been walking 160 feet or more each Visit; 09/09/2023= Patient ambulated 180 feet overall at one time today using bariatric RW, CGA with VC for erect posture and for increased step length. 10/02/2023= 165 feet with Bariatric RW- close w/c follow and CGA. More diffiuclty with gait sequencing more consistent VC for erect posture. 12/02/2023- Paitent ambulated >100 feet in // bars today but will reassess using walker next visit. 12/11/2023= Patient ambulated 204 feet with BRW and Close CGA and w/c follow Goal Status: PROGRESSING  8. Patient will report ability to walk out his front door including stepping over threshold using his walker with min A of caregiver to enable him to be more independent with household mobility and be able to go out to porch with less dependence on caregivers. Baseline: Patient dependent on caregivers to push him in his wheelchair to go out to porch and reports dependent on them to always assist so he doesn't go outside like he would like to. Goal status: NEW  ASSESSMENT :  CLINICAL IMPRESSION:  Patient presents with continual progression with overall standing with more erect standing during activities with decreased overall assistance. He was able to participate in prolonged activities with some dynamic reaching overhead with each UE well today.   Pt will continue to benefit from skilled physical therapy intervention to address impairments, improve QOL, and attain therapy goals.     OBJECTIVE IMPAIRMENTS Abnormal gait, decreased activity tolerance, decreased balance, decreased coordination, decreased endurance, decreased mobility, difficulty walking, decreased ROM, decreased strength, improper body  mechanics, postural dysfunction, and obesity.   ACTIVITY LIMITATIONS carrying, lifting, bending, sitting, standing, squatting, stairs, transfers, bed mobility, continence, bathing, toileting, dressing, hygiene/grooming, and locomotion level  PARTICIPATION LIMITATIONS: meal prep, cleaning, laundry, interpersonal relationship, driving, shopping, community activity, and yard work  PERSONAL FACTORS Fitness, Past/current experiences, Time since onset of injury/illness/exacerbation, and 3+ comorbidities: hx of brain tumor, past CVA, HTN, obesity, learning disability, DMII are also affecting patient's functional outcome.   REHAB POTENTIAL: Fair    CLINICAL DECISION MAKING: Stable/uncomplicated  EVALUATION COMPLEXITY: Low  PLAN: PT FREQUENCY: 1-2x/week  PT DURATION: 12 weeks  PLANNED INTERVENTIONS: Therapeutic exercises, Therapeutic activity, Neuromuscular re-education, Balance training, Gait training, Patient/Family education, Joint mobilization, Stair training, Vestibular training, Orthotic/Fit training, DME instructions, Dry Needling, Electrical stimulation, Wheelchair mobility training, Cryotherapy, Moist heat, Compression bandaging, Manual therapy, and Re-evaluation  PLAN FOR NEXT SESSION:   -Bridging and review of some bed LE strengthening -Continue with functional strengthening exercises of the LE's  -Gait activities using RW. Incorporate proper technique with all transfers to decrease his risk of falling.   Curtis Powell PT  Physical Therapist- [place]  [facility]   9:30 PM 12/23/23

## 2023-12-25 ENCOUNTER — Ambulatory Visit

## 2023-12-25 DIAGNOSIS — R2681 Unsteadiness on feet: Secondary | ICD-10-CM | POA: Diagnosis not present

## 2023-12-25 DIAGNOSIS — R296 Repeated falls: Secondary | ICD-10-CM

## 2023-12-25 DIAGNOSIS — M6281 Muscle weakness (generalized): Secondary | ICD-10-CM

## 2023-12-25 DIAGNOSIS — R262 Difficulty in walking, not elsewhere classified: Secondary | ICD-10-CM

## 2023-12-25 DIAGNOSIS — R2689 Other abnormalities of gait and mobility: Secondary | ICD-10-CM

## 2023-12-25 DIAGNOSIS — R269 Unspecified abnormalities of gait and mobility: Secondary | ICD-10-CM

## 2023-12-25 DIAGNOSIS — R531 Weakness: Secondary | ICD-10-CM

## 2023-12-25 NOTE — Therapy (Signed)
 OUTPATIENT PHYSICAL THERAPY TREATMENT   Patient Name: Curtis Powell MRN: [id number] DOB:09-19-1965, 58 y.o., male Today's Date: 12/25/2023  PCP: Sherial Bail, MD  REFERRING PROVIDER: Sherial Bail, MD    PT End of Session - 12/25/23 1315     Visit Number 111    Number of Visits 130    Date for Recertification  02/24/24    Progress Note Due on Visit 120    PT Start Time 1315    PT Stop Time 1359    PT Time Calculation (min) 44 min    Equipment Utilized During Treatment Gait belt    Activity Tolerance Patient tolerated treatment well    Behavior During Therapy WFL for tasks assessed/performed               Past Medical History:  Diagnosis Date   Asthma    GERD (gastroesophageal reflux disease)    Hyperlipidemia    Hypertension    Obesity    Stroke [facility])    Past Surgical History:  Procedure Laterality Date   BRAIN SURGERY     Patient Active Problem List   Diagnosis Date Noted   Impaired glucose tolerance 03/23/2020   Learning disability 03/23/2020   Urinary and fecal incontinence 06/14/2019   Chronic pain syndrome 03/23/2019   Constipation 03/23/2019   Dysarthria 03/23/2019   Dysphagia 03/23/2019   Learning difficulty 03/23/2019   Morbid obesity (HCC) 03/23/2019   Muscle weakness 03/23/2019   Vitamin D deficiency 03/23/2019   Bilateral carotid artery stenosis 12/15/2018   Moderate aortic valve stenosis 12/15/2018   BMI 45.0-49.9, adult (HCC) 11/10/2018   Cerebrovascular accident (HCC) 05/02/2016   Hemiparesis affecting right side as late effect of cerebrovascular accident (HCC) 05/01/2016   Brain tumor (HCC) 11/24/2014   Gastro-esophageal reflux disease without esophagitis 11/24/2014   Accident due to mechanical fall without injury 08/03/2014   Essential hypertension 06/13/2014   Mixed hyperlipidemia 06/13/2014   Unspecified sequelae of cerebral infarction 06/13/2014   Thyromegaly 01/07/2014   Type 2 diabetes mellitus without  complication (HCC) 01/06/2014   Neck mass 09/08/2013   Swimmer's ear 09/08/2013   Erectile dysfunction 01/03/2013   Prediabetes 01/03/2013   ONSET DATE: 05/09/2016  REFERRING DIAG: P30.648 (ICD-10-CM) - Hemiplegia and hemiparesis following cerebral infarction affecting right dominant side   THERAPY DIAG:  Difficulty in walking, not elsewhere classified  Unsteadiness on feet  Muscle weakness (generalized)  Abnormality of gait and mobility  Left-sided weakness  Repeated falls  Other abnormalities of gait and mobility   Rationale for Evaluation and Treatment Rehabilitation  SUBJECTIVE:  SUBJECTIVE STATEMENT  Patient reports having a good week - states went out to eat for his birthday.   Pt accompanied by: aunt  PERTINENT HISTORY:  The pt is a pleasant 58 yo male who returns to PT for evaluation of deficits s/o old CVA. Pt with hx of CVA Feb 2018. He was previously fully independent, but CVA caused significant deficits, mainly affecting his right side. Pt reports continued difficulty with walking and transfers. Pt still using WC for majority of mobility, otherwise he uses his RW. Pt lives with his mom who is his main caregiver. Patient had outpatient PT previously in clinic in 2023. He reports since discharge he has been completing his HEP and walking about every day. Pt would like to improve his walking mainly, but also would like to work on balance. He also reports difficulty with transfers. Pt's mom and his caregiver helps him with ADLs. However, he says he is now waiting for a new caregiver. Prior to this pt had caregiver 3x/week. PMH significant for hx of brain tumor, past CVA, DMII and HTN. See chart for additional information.  PAIN:  Are you having pain? Pt reports no pain  currently  PRECAUTIONS: fall  WEIGHT BEARING RESTRICTIONS none  PLOF: required assistance with ADLs (old CVA), prior to CVA in 2018 was indep.  History of Falls: Pt reports no recent falls  Home information: Pt lives with mother who is his primary caregiver. Previously had home aide, 3x/week but reports currently waiting for new one.  PATIENT GOALS: Pt would like to improve his ability to ambulate.  OBJECTIVE:  taken at [facility] unless otherwise noted:   DIAGNOSTIC FINDINGS: No recent imaging per chart  Objective measures taken at eval unless otherwise specified:   Posture: forward head posture, rounded shoulders  MMT LE: grossly  4+/5, exception hip flexors and abductors 4/5 each bilat   Functional tests:  5xSTS:  34 sec, use of BUE (multiple initial attempts with posterior LOB in chair)   : 0.096 m/s with RW, close CGA, and WC follow  Gait for distance (goal 150 ft): deferred due to time  Gait mechanics: impaired, decreased speed (see ), crouched posture, poor postural stability and relies heavily on BUE support on RW. Pt now with bilat AFOS   FOTO: 44 (goal 50)   TODAY'S TREATMENT : 12/25/23    TA:   Sit to stand with 1 hand on armrest and the other on walker handle (on R) x 15 reps  Static stand x 2 min without UE support then dynamic UE reaching with only CGA x approx 20 reaches each UE- focusing on overall balance and on erect posture.    Gait: Ambulation with BRW, CGA with B AFO donned and close w/c follow -approx 150 feet today- Stopped due to fatigue.   -Swing phase practice for more efficient gait sequencing- standing in front of w/c using BRW - placing a cone in front of R LE as a goal to achieve with swing through x 20 reps x 2 sets        PATIENT EDUCATION: Education details: If pt can lean pelvis into support surface, his energy efficicency will be higher.  Person educated: Patient Education method: Explanation, Demonstration, Tactile  cues, and Verbal cues Education comprehension: verbalized understanding, returned demonstration, and verbal cues required  HOME EXERCISE PROGRAM:  Standing hip flexor stretch at counter, prolonged standing with erect posture for gluteal activation, HS curls with RTB  Access Code: [email or web address] URL: [email or web address] Date: 05/15/2023  Prepared by: Connell Kiss  Exercises - Seated Long Arc Quad with Ankle Weight  - 1 x daily - 7 x weekly - 2 sets - 10 reps  GOALS: Goals reviewed with patient? No  SHORT TERM GOALS: Target date: 11/12/2022  Pt will be independent with HEP in order to improve strength and balance in order to decrease fall risk and improve function at home. Baseline: instructed in HEP today, to provide printout next visit Goal status: MET  LONG TERM GOALS: Target date: 02/24/2024  Pt will increase FOTO to at least 50 in order to demonstrate improvement in function related to mobility and QoL. Baseline: 44 7/29:48 9/10:40 12/24/2022= 51 Goal status: MET  2.  Pt (< 60 yrs old) will complete 5xSTS test < 30 seconds indicating an increase in LE strength and improved balance. Baseline: 10/01/22 34 sec (multiple initial attempts with posterior LOB in chair) 12/20: 49 seconds 11/04/22:1:06, min A on multiple attempts to maintain balance and pt stays in flexed posture throughout test when coming to standing.   9/10: 27.5 sec, Pushes WC post consistently, close CGA  10/22: 47 seconds first attempt 12/5: 1 minute, 37 seconds, multiple attempts to get first stand, but no difficulty after that; 03/20/2023= 1 min 2 sec with 1UE on walker and 1 on armrest- some posterior lean- Close CGA; 06/17/2023= 38 sec with 1 UE on walker and 1 on armrest; 08/21/2023= 35 sec with 1UE on walker and 1 on armrest.  8/5: 36 sec with 1 UE on walker 1 on arm rest; 12/23/2023= 37.05 sec with some posterior LOB- CGA to min A to correct.  Goal status: ONGOING  3.  Pt will increase to > 0.5  m/s (30 sec) as to improve gait speed for better home ambulation and reduce fall risk. Baseline: 10/01/22 0.096 m/s with RW, close CGA, and WC follow previously 12/27: 0.11 m/s 11/04/22: .056 m/s  9/10: 076 m/s 10/22: 0.64 m/s  11/26: .076 m/s; 03/20/2023= 0.08 m/s; 04/22/2023= 0.089 m/s; 05/15/2023 = 0.079 m/s; 05/27/2023= 0.11 m/s; 07/17/2023= 0.15 m/s; 08/21/2023= 0.14 m/s avg with Bariatric walker; 09/09/2023= 0.14 m/s; 10/02/2023= 0.09 m/s with Bariatric walker and Increased cues to swing RLE through vs. Previous sesisons. 11/13/2023= 0.12 m/s with Bariatric walker; 12/11/2023= 0.14 m/s; 12/23/2023=0.135 m/s Goal status: ONGOING  4.  Pt will increase hip flexor and abductor  gross strength to 4+/5 bilat as to improve functional strength for independent gait, increased standing tolerance, and increased ADL ability. Baseline: 10/01/22 4+/5, exception hip flexors and abductors 4/5 each bilat  (previously 03/27/22: 4+/5 ) 9/10: Hip flexor 4/5, hip abductor 4+/5 10/22: hip flexor 4/5 hip abductor 4+/5 11/26: hip flexor 4+/5 hip abductor 4+/5 Goal status: MET  5.  Pt will ambulate 150 ft with BRW and WC follow with no rest breaks in less than 12 min to allow for improved mobility within home and increased independence. Baseline: 10/03/22 150' with BRW 04/03/22: 105 ft with BRW 7/29: 100 in 9:30 with pediatric RW 9/10:101 ft with BRW stooped posture worsened throughout test 12/24/2022- 150 feet using BRW, CGA with w/c follow- in 10 min 17 sec. Will keep goal to make sure he can do this consistently 10/22: 119 ft with BRW 8 min 23 seconds terminated by patient  11/26: 148 ft with BRW 14 min 34 seconds terminated by patient 03/20/2023= 152 feet using BRW, CGA and w/c follow in 14 min 3 sec- Need to keep goal active to ensure consistency but will revised and add  a time element to goal. 04/22/2023= 162 feet in 11 min 54 sec using bariatric RW, CGA and close CGA assist; 05/27/2023- 170 feet without rest break using bariatric  RW. 06/17/2023= 160 feet with bariatric 4WW in 9 min- stopped due to fatigue.  Goal status: MET   6. Pt will decrease TUG to below 1 min 30 seconds/decrease in order to demonstrate decreased fall risk. Baseline: 06/17/2023= 2 min using bariatric 4WW; 09/09/2023= 1 min 50 sec with bariatric front wheeled walker. 12/02/2023= 1 min 44 sec; 12/23/2023= 2 min 8 sec avg with BRW Goal status: ONGOING  7. Pt will ambulate 300 ft with BRW, CGA and WC follow with no rest breaks in less than 20 min to allow for improved mobility within home/community and increased independence. Baseline: 06/17/2023= 160 feet with bariatric 4WW in 9 min- stopped due to fatigue. 07/17/2023- Patient able to walk 270 feet in > 20 min with BRW, CGA and close w/c follow today- VC for posture and for gait sequencing; 08/21/2023- Not specifically testing but patient has been walking 160 feet or more each Visit; 09/09/2023= Patient ambulated 180 feet overall at one time today using bariatric RW, CGA with VC for erect posture and for increased step length. 10/02/2023= 165 feet with Bariatric RW- close w/c follow and CGA. More diffiuclty with gait sequencing more consistent VC for erect posture. 12/02/2023- Paitent ambulated >100 feet in // bars today but will reassess using walker next visit. 12/11/2023= Patient ambulated 204 feet with BRW and Close CGA and w/c follow Goal Status: PROGRESSING  8. Patient will report ability to walk out his front door including stepping over threshold using his walker with min A of caregiver to enable him to be more independent with household mobility and be able to go out to porch with less dependence on caregivers. Baseline: Patient dependent on caregivers to push him in his wheelchair to go out to porch and reports dependent on them to always assist so he doesn't go outside like he would like to. Goal status: NEW  ASSESSMENT :  CLINICAL IMPRESSION:   Patient exhibited increased difficulty with swinging RLE past LLE  today- more of a step to pattern with slower overall gait speed which corresponded to decrease overal gait distance. Patient with difficulty accepting VC and when asked upon resting why he couldn't take a longer step he replied I don't know my brain just couldn't get my let to do it. Later in session - focused specifically on swing phase with RLE and patient struggled yet able to kick the positioned cone to emphasize longer stride. Pt will continue to benefit from skilled physical therapy intervention to address impairments, improve QOL, and attain therapy goals.     OBJECTIVE IMPAIRMENTS Abnormal gait, decreased activity tolerance, decreased balance, decreased coordination, decreased endurance, decreased mobility, difficulty walking, decreased ROM, decreased strength, improper body mechanics, postural dysfunction, and obesity.   ACTIVITY LIMITATIONS carrying, lifting, bending, sitting, standing, squatting, stairs, transfers, bed mobility, continence, bathing, toileting, dressing, hygiene/grooming, and locomotion level  PARTICIPATION LIMITATIONS: meal prep, cleaning, laundry, interpersonal relationship, driving, shopping, community activity, and yard work  PERSONAL FACTORS Fitness, Past/current experiences, Time since onset of injury/illness/exacerbation, and 3+ comorbidities: hx of brain tumor, past CVA, HTN, obesity, learning disability, DMII are also affecting patient's functional outcome.   REHAB POTENTIAL: Fair    CLINICAL DECISION MAKING: Stable/uncomplicated  EVALUATION COMPLEXITY: Low  PLAN: PT FREQUENCY: 1-2x/week  PT DURATION: 12 weeks  PLANNED INTERVENTIONS: Therapeutic exercises, Therapeutic activity, Neuromuscular re-education, Balance  training, Gait training, Patient/Family education, Joint mobilization, Stair training, Vestibular training, Orthotic/Fit training, DME instructions, Dry Needling, Electrical stimulation, Wheelchair mobility training, Cryotherapy, Moist heat,  Compression bandaging, Manual therapy, and Re-evaluation  PLAN FOR NEXT SESSION:   -Bridging and review of some bed LE strengthening -Continue with functional strengthening exercises of the LE's  -Gait activities using RW. Incorporate proper technique with all transfers to decrease his risk of falling.   Reyes LOISE London PT  Physical Therapist- [place]  [facility]   9:31 PM 12/25/23    OUTPATIENT PHYSICAL THERAPY TREATMENT  Patient Name: Curtis Powell MRN: [id number] DOB:1966-03-23, 58 y.o., male Today's Date: 12/25/2023  PCP: Sherial Bail, MD  REFERRING PROVIDER: Sherial Bail, MD    PT End of Session - 12/25/23 1315     Visit Number 111    Number of Visits 130    Date for Recertification  02/24/24    Progress Note Due on Visit 120    PT Start Time 1315    PT Stop Time 1359    PT Time Calculation (min) 44 min    Equipment Utilized During Treatment Gait belt    Activity Tolerance Patient tolerated treatment well    Behavior During Therapy WFL for tasks assessed/performed               Past Medical History:  Diagnosis Date   Asthma    GERD (gastroesophageal reflux disease)    Hyperlipidemia    Hypertension    Obesity    Stroke [facility])    Past Surgical History:  Procedure Laterality Date   BRAIN SURGERY     Patient Active Problem List   Diagnosis Date Noted   Impaired glucose tolerance 03/23/2020   Learning disability 03/23/2020   Urinary and fecal incontinence 06/14/2019   Chronic pain syndrome 03/23/2019   Constipation 03/23/2019   Dysarthria 03/23/2019   Dysphagia 03/23/2019   Learning difficulty 03/23/2019   Morbid obesity (HCC) 03/23/2019   Muscle weakness 03/23/2019   Vitamin D deficiency 03/23/2019   Bilateral carotid artery stenosis 12/15/2018   Moderate aortic valve stenosis 12/15/2018   BMI 45.0-49.9, adult (HCC) 11/10/2018   Cerebrovascular accident (HCC) 05/02/2016   Hemiparesis affecting  right side as late effect of cerebrovascular accident (HCC) 05/01/2016   Brain tumor (HCC) 11/24/2014   Gastro-esophageal reflux disease without esophagitis 11/24/2014   Accident due to mechanical fall without injury 08/03/2014   Essential hypertension 06/13/2014   Mixed hyperlipidemia 06/13/2014   Unspecified sequelae of cerebral infarction 06/13/2014   Thyromegaly 01/07/2014   Type 2 diabetes mellitus without complication (HCC) 01/06/2014   Neck mass 09/08/2013   Swimmer's ear 09/08/2013   Erectile dysfunction 01/03/2013   Prediabetes 01/03/2013   ONSET DATE: 05/09/2016  REFERRING DIAG: P30.648 (ICD-10-CM) - Hemiplegia and hemiparesis following cerebral infarction affecting right dominant side   THERAPY DIAG:  Difficulty in walking, not elsewhere classified  Unsteadiness on feet  Muscle weakness (generalized)  Abnormality of gait and mobility  Left-sided weakness  Repeated falls  Other abnormalities of gait and mobility   Rationale for Evaluation and Treatment Rehabilitation  SUBJECTIVE:  SUBJECTIVE STATEMENT  Patient reports doing well overall with no new issues.    Pt accompanied by: aunt  PERTINENT HISTORY:  The pt is a pleasant 58 yo male who returns to PT for evaluation of deficits s/o old CVA. Pt with hx of CVA Feb 2018. He was previously fully independent, but CVA caused significant deficits, mainly affecting his right side. Pt reports continued difficulty with walking and transfers. Pt still using WC for majority of mobility, otherwise he uses his RW. Pt lives with his mom who is his main caregiver. Patient had outpatient PT previously in clinic in 2023. He reports since discharge he has been completing his HEP and walking about every day. Pt would like to improve his walking  mainly, but also would like to work on balance. He also reports difficulty with transfers. Pt's mom and his caregiver helps him with ADLs. However, he says he is now waiting for a new caregiver. Prior to this pt had caregiver 3x/week. PMH significant for hx of brain tumor, past CVA, DMII and HTN. See chart for additional information.  PAIN:  Are you having pain? Pt reports no pain currently  PRECAUTIONS: fall  WEIGHT BEARING RESTRICTIONS none  PLOF: required assistance with ADLs (old CVA), prior to CVA in 2018 was indep.  History of Falls: Pt reports no recent falls  Home information: Pt lives with mother who is his primary caregiver. Previously had home aide, 3x/week but reports currently waiting for new one.  PATIENT GOALS: Pt would like to improve his ability to ambulate.  OBJECTIVE:  taken at [facility] unless otherwise noted:   DIAGNOSTIC FINDINGS: No recent imaging per chart  Objective measures taken at eval unless otherwise specified:   Posture: forward head posture, rounded shoulders  MMT LE: grossly  4+/5, exception hip flexors and abductors 4/5 each bilat   Functional tests:  5xSTS:  34 sec, use of BUE (multiple initial attempts with posterior LOB in chair)   : 0.096 m/s with RW, close CGA, and WC follow  Gait for distance (goal 150 ft): deferred due to time  Gait mechanics: impaired, decreased speed (see ), crouched posture, poor postural stability and relies heavily on BUE support on RW. Pt now with bilat AFOS   FOTO: 44 (goal 50)   TODAY'S TREATMENT 12/16/23     TA- To improve functional movements patterns for everyday tasks   Static stand without UE support x 5 min Standing dynamic activity - arranging magnet letters into appropriate square using 1 UE for support on support bar while opp UE reaching overhead an dmoving letter into corresponding shapy on dry erase board- focusing on erect standing, dynamic weight shifting L- R, and 1 UE support with  dynamic UE reaching. X 25 min Sit to stand from w/c with 1 hand on armrest and opp UE pulling up from support bar today with (SBA)  3 sets x 10 reps - brief rest between each Set     PATIENT EDUCATION: Education details: If pt can lean pelvis into support surface, his energy efficicency will be higher.  Person educated: Patient Education method: Explanation, Demonstration, Tactile cues, and Verbal cues Education comprehension: verbalized understanding, returned demonstration, and verbal cues required  HOME EXERCISE PROGRAM:  Standing hip flexor stretch at counter, prolonged standing with erect posture for gluteal activation, HS curls with RTB  Access Code: [email or web address] URL: [email or web address] Date: 05/15/2023 Prepared by: Connell Kiss  Exercises - Seated Long Arc Quad with Ankle Weight  - 1 x daily -  7 x weekly - 2 sets - 10 reps  GOALS: Goals reviewed with patient? No  SHORT TERM GOALS: Target date: 11/12/2022  Pt will be independent with HEP in order to improve strength and balance in order to decrease fall risk and improve function at home. Baseline: instructed in HEP today, to provide printout next visit Goal status: MET  LONG TERM GOALS: Target date: 02/24/2024  Pt will increase FOTO to at least 50 in order to demonstrate improvement in function related to mobility and QoL. Baseline: 44 7/29:48 9/10:40 12/24/2022= 51 Goal status: MET  2.  Pt (< 60 yrs old) will complete 5xSTS test < 30 seconds indicating an increase in LE strength and improved balance. Baseline: 10/01/22 34 sec (multiple initial attempts with posterior LOB in chair) 12/20: 49 seconds 11/04/22:1:06, min A on multiple attempts to maintain balance and pt stays in flexed posture throughout test when coming to standing.   9/10: 27.5 sec, Pushes WC post consistently, close CGA  10/22: 47 seconds first attempt 12/5: 1 minute, 37 seconds, multiple attempts to get first stand, but no difficulty after  that; 03/20/2023= 1 min 2 sec with 1UE on walker and 1 on armrest- some posterior lean- Close CGA; 06/17/2023= 38 sec with 1 UE on walker and 1 on armrest; 08/21/2023= 35 sec with 1UE on walker and 1 on armrest.  8/5: 36 sec with 1 UE on walker 1 on arm rest  Goal status: ONGOING  3.  Pt will increase to > 0.5 m/s (30 sec) as to improve gait speed for better home ambulation and reduce fall risk. Baseline: 10/01/22 0.096 m/s with RW, close CGA, and WC follow previously 12/27: 0.11 m/s 11/04/22: .056 m/s  9/10: 076 m/s 10/22: 0.64 m/s  11/26: .076 m/s; 03/20/2023= 0.08 m/s; 04/22/2023= 0.089 m/s; 05/15/2023 = 0.079 m/s; 05/27/2023= 0.11 m/s; 07/17/2023= 0.15 m/s; 08/21/2023= 0.14 m/s avg with Bariatric walker; 09/09/2023= 0.14 m/s; 10/02/2023= 0.09 m/s with Bariatric walker and Increased cues to swing RLE through vs. Previous sesisons. 11/13/2023= 0.12 m/s with Bariatric walker; 12/11/2023= 0.14 m/s Goal status: ONGOING  4.  Pt will increase hip flexor and abductor  gross strength to 4+/5 bilat as to improve functional strength for independent gait, increased standing tolerance, and increased ADL ability. Baseline: 10/01/22 4+/5, exception hip flexors and abductors 4/5 each bilat  (previously 03/27/22: 4+/5 ) 9/10: Hip flexor 4/5, hip abductor 4+/5 10/22: hip flexor 4/5 hip abductor 4+/5 11/26: hip flexor 4+/5 hip abductor 4+/5 Goal status: MET  5.  Pt will ambulate 150 ft with BRW and WC follow with no rest breaks in less than 12 min to allow for improved mobility within home and increased independence. Baseline: 10/03/22 150' with BRW 04/03/22: 105 ft with BRW 7/29: 100 in 9:30 with pediatric RW 9/10:101 ft with BRW stooped posture worsened throughout test 12/24/2022- 150 feet using BRW, CGA with w/c follow- in 10 min 17 sec. Will keep goal to make sure he can do this consistently 10/22: 119 ft with BRW 8 min 23 seconds terminated by patient  11/26: 148 ft with BRW 14 min 34 seconds terminated by  patient 03/20/2023= 152 feet using BRW, CGA and w/c follow in 14 min 3 sec- Need to keep goal active to ensure consistency but will revised and add a time element to goal. 04/22/2023= 162 feet in 11 min 54 sec using bariatric RW, CGA and close CGA assist; 05/27/2023- 170 feet without rest break using bariatric RW. 06/17/2023= 160 feet with  bariatric 4WW in 9 min- stopped due to fatigue.  Goal status: MET   6. Pt will decrease TUG to below 1 min 30 seconds/decrease in order to demonstrate decreased fall risk. Baseline: 06/17/2023= 2 min using bariatric 4WW; 09/09/2023= 1 min 50 sec with bariatric front wheeled walker. 12/02/2023= 1 min 44 sec  Goal status: PROGRESSING  7. Pt will ambulate 300 ft with BRW, CGA and WC follow with no rest breaks in less than 20 min to allow for improved mobility within home/community and increased independence. Baseline: 06/17/2023= 160 feet with bariatric 4WW in 9 min- stopped due to fatigue. 07/17/2023- Patient able to walk 270 feet in > 20 min with BRW, CGA and close w/c follow today- VC for posture and for gait sequencing; 08/21/2023- Not specifically testing but patient has been walking 160 feet or more each Visit; 09/09/2023= Patient ambulated 180 feet overall at one time today using bariatric RW, CGA with VC for erect posture and for increased step length. 10/02/2023= 165 feet with Bariatric RW- close w/c follow and CGA. More diffiuclty with gait sequencing more consistent VC for erect posture. 12/02/2023- Paitent ambulated >100 feet in // bars today but will reassess using walker next visit. 12/11/2023= Patient ambulated 204 feet with BRW and Close CGA and w/c follow Goal Status: PROGRESSING  8. Patient will report ability to walk out his front door including stepping over threshold using his walker with min A of caregiver to enable him to be more independent with household mobility and be able to go out to porch with less dependence on caregivers. Baseline: Patient dependent on  caregivers to push him in his wheelchair to go out to porch and reports dependent on them to always assist so he doesn't go outside like he would like to. Goal status: NEW  ASSESSMENT :  CLINICAL IMPRESSION:   Patient presents with continual progression with overall standing with more erect standing during activities with decreased overall assistance. He was able to participate in prolonged activities with some dynamic reaching overhead with each UE well today.   Pt will continue to benefit from skilled physical therapy intervention to address impairments, improve QOL, and attain therapy goals.     OBJECTIVE IMPAIRMENTS Abnormal gait, decreased activity tolerance, decreased balance, decreased coordination, decreased endurance, decreased mobility, difficulty walking, decreased ROM, decreased strength, improper body mechanics, postural dysfunction, and obesity.   ACTIVITY LIMITATIONS carrying, lifting, bending, sitting, standing, squatting, stairs, transfers, bed mobility, continence, bathing, toileting, dressing, hygiene/grooming, and locomotion level  PARTICIPATION LIMITATIONS: meal prep, cleaning, laundry, interpersonal relationship, driving, shopping, community activity, and yard work  PERSONAL FACTORS Fitness, Past/current experiences, Time since onset of injury/illness/exacerbation, and 3+ comorbidities: hx of brain tumor, past CVA, HTN, obesity, learning disability, DMII are also affecting patient's functional outcome.   REHAB POTENTIAL: Fair    CLINICAL DECISION MAKING: Stable/uncomplicated  EVALUATION COMPLEXITY: Low  PLAN: PT FREQUENCY: 1-2x/week  PT DURATION: 12 weeks  PLANNED INTERVENTIONS: Therapeutic exercises, Therapeutic activity, Neuromuscular re-education, Balance training, Gait training, Patient/Family education, Joint mobilization, Stair training, Vestibular training, Orthotic/Fit training, DME instructions, Dry Needling, Electrical stimulation, Wheelchair mobility  training, Cryotherapy, Moist heat, Compression bandaging, Manual therapy, and Re-evaluation  PLAN FOR NEXT SESSION:   -Bridging and review of some bed LE strengthening -Continue with functional strengthening exercises of the LE's  -Gait activities using RW. Incorporate proper technique with all transfers to decrease his risk of falling.   Reyes LOISE London PT  Physical Therapist- [place]  [facility]  9:31 PM 12/25/23

## 2023-12-30 ENCOUNTER — Ambulatory Visit

## 2023-12-30 DIAGNOSIS — R269 Unspecified abnormalities of gait and mobility: Secondary | ICD-10-CM

## 2023-12-30 DIAGNOSIS — R262 Difficulty in walking, not elsewhere classified: Secondary | ICD-10-CM

## 2023-12-30 DIAGNOSIS — R531 Weakness: Secondary | ICD-10-CM

## 2023-12-30 DIAGNOSIS — R2681 Unsteadiness on feet: Secondary | ICD-10-CM | POA: Diagnosis not present

## 2023-12-30 DIAGNOSIS — M6281 Muscle weakness (generalized): Secondary | ICD-10-CM

## 2023-12-30 DIAGNOSIS — R296 Repeated falls: Secondary | ICD-10-CM

## 2023-12-30 DIAGNOSIS — R2689 Other abnormalities of gait and mobility: Secondary | ICD-10-CM

## 2023-12-30 NOTE — Therapy (Signed)
 OUTPATIENT PHYSICAL THERAPY TREATMENT   Patient Name: Curtis Powell MRN: [id number] DOB:1965-07-06, 58 y.o., male Today's Date: 12/30/2023  PCP: Sherial Bail, MD  REFERRING PROVIDER: Sherial Bail, MD    PT End of Session - 12/30/23 1410     Visit Number 112    Number of Visits 130    Date for Recertification  02/24/24    Progress Note Due on Visit 120    PT Start Time 1317    PT Stop Time 1400    PT Time Calculation (min) 43 min    Equipment Utilized During Treatment Gait belt    Activity Tolerance Patient tolerated treatment well    Behavior During Therapy WFL for tasks assessed/performed               Past Medical History:  Diagnosis Date   Asthma    GERD (gastroesophageal reflux disease)    Hyperlipidemia    Hypertension    Obesity    Stroke [facility])    Past Surgical History:  Procedure Laterality Date   BRAIN SURGERY     Patient Active Problem List   Diagnosis Date Noted   Impaired glucose tolerance 03/23/2020   Learning disability 03/23/2020   Urinary and fecal incontinence 06/14/2019   Chronic pain syndrome 03/23/2019   Constipation 03/23/2019   Dysarthria 03/23/2019   Dysphagia 03/23/2019   Learning difficulty 03/23/2019   Morbid obesity (HCC) 03/23/2019   Muscle weakness 03/23/2019   Vitamin D deficiency 03/23/2019   Bilateral carotid artery stenosis 12/15/2018   Moderate aortic valve stenosis 12/15/2018   BMI 45.0-49.9, adult (HCC) 11/10/2018   Cerebrovascular accident (HCC) 05/02/2016   Hemiparesis affecting right side as late effect of cerebrovascular accident (HCC) 05/01/2016   Brain tumor (HCC) 11/24/2014   Gastro-esophageal reflux disease without esophagitis 11/24/2014   Accident due to mechanical fall without injury 08/03/2014   Essential hypertension 06/13/2014   Mixed hyperlipidemia 06/13/2014   Unspecified sequelae of cerebral infarction 06/13/2014   Thyromegaly 01/07/2014   Type 2 diabetes mellitus without  complication 01/06/2014   Neck mass 09/08/2013   Swimmer's ear 09/08/2013   Erectile dysfunction 01/03/2013   Prediabetes 01/03/2013   ONSET DATE: 05/09/2016  REFERRING DIAG: I69.351 (ICD-10-CM) - Hemiplegia and hemiparesis following cerebral infarction affecting right dominant side   THERAPY DIAG:  Difficulty in walking, not elsewhere classified  Unsteadiness on feet  Muscle weakness (generalized)  Abnormality of gait and mobility  Left-sided weakness  Repeated falls  Other abnormalities of gait and mobility   Rationale for Evaluation and Treatment Rehabilitation  SUBJECTIVE:  SUBJECTIVE STATEMENT  Patient reports having a good week - states went out to eat for his birthday.   Pt accompanied by: aunt  PERTINENT HISTORY:  The pt is a pleasant 58 yo male who returns to PT for evaluation of deficits s/o old CVA. Pt with hx of CVA Feb 2018. He was previously fully independent, but CVA caused significant deficits, mainly affecting his right side. Pt reports continued difficulty with walking and transfers. Pt still using WC for majority of mobility, otherwise he uses his RW. Pt lives with his mom who is his main caregiver. Patient had outpatient PT previously in clinic in 2023. He reports since discharge he has been completing his HEP and walking about every day. Pt would like to improve his walking mainly, but also would like to work on balance. He also reports difficulty with transfers. Pt's mom and his caregiver helps him with ADLs. However, he says he is now waiting for a new caregiver. Prior to this pt had caregiver 3x/week. PMH significant for hx of brain tumor, past CVA, DMII and HTN. See chart for additional information.  PAIN:  Are you having pain? Pt reports no pain  currently  PRECAUTIONS: fall  WEIGHT BEARING RESTRICTIONS none  PLOF: required assistance with ADLs (old CVA), prior to CVA in 2018 was indep.  History of Falls: Pt reports no recent falls  Home information: Pt lives with mother who is his primary caregiver. Previously had home aide, 3x/week but reports currently waiting for new one.  PATIENT GOALS: Pt would like to improve his ability to ambulate.  OBJECTIVE:  taken at [facility] unless otherwise noted:   DIAGNOSTIC FINDINGS: No recent imaging per chart  Objective measures taken at eval unless otherwise specified:   Posture: forward head posture, rounded shoulders  MMT LE: grossly  4+/5, exception hip flexors and abductors 4/5 each bilat   Functional tests:  5xSTS:  34 sec, use of BUE (multiple initial attempts with posterior LOB in chair)   : 0.096 m/s with RW, close CGA, and WC follow  Gait for distance (goal 150 ft): deferred due to time  Gait mechanics: impaired, decreased speed (see ), crouched posture, poor postural stability and relies heavily on BUE support on RW. Pt now with bilat AFOS   FOTO: 44 (goal 50)   TODAY'S TREATMENT : 12/30/23    TA:   Sit to stand with 1 hand on armrest and the other on walker handle (on R) x 15 reps  Static stand x 2 min without UE support then dynamic UE reaching with only CGA x approx 20 reaches each UE- focusing on overall balance and on erect posture.    Gait: Ambulation with BRW, CGA with B AFO donned and close w/c follow -approx 150 feet today- Stopped due to fatigue.   -Swing phase practice for more efficient gait sequencing- standing in front of w/c using BRW - placing a cone in front of R LE as a goal to achieve with swing through x 20 reps x 2 sets        PATIENT EDUCATION: Education details: If pt can lean pelvis into support surface, his energy efficicency will be higher.  Person educated: Patient Education method: Explanation, Demonstration, Tactile  cues, and Verbal cues Education comprehension: verbalized understanding, returned demonstration, and verbal cues required  HOME EXERCISE PROGRAM:  Standing hip flexor stretch at counter, prolonged standing with erect posture for gluteal activation, HS curls with RTB  Access Code: [email or web address] URL: [email or web address] Date: 05/15/2023  Prepared by: Connell Kiss  Exercises - Seated Long Arc Quad with Ankle Weight  - 1 x daily - 7 x weekly - 2 sets - 10 reps  GOALS: Goals reviewed with patient? No  SHORT TERM GOALS: Target date: 11/12/2022  Pt will be independent with HEP in order to improve strength and balance in order to decrease fall risk and improve function at home. Baseline: instructed in HEP today, to provide printout next visit Goal status: MET  LONG TERM GOALS: Target date: 02/24/2024  Pt will increase FOTO to at least 50 in order to demonstrate improvement in function related to mobility and QoL. Baseline: 44 7/29:48 9/10:40 12/24/2022= 51 Goal status: MET  2.  Pt (< 60 yrs old) will complete 5xSTS test < 30 seconds indicating an increase in LE strength and improved balance. Baseline: 10/01/22 34 sec (multiple initial attempts with posterior LOB in chair) 12/20: 49 seconds 11/04/22:1:06, min A on multiple attempts to maintain balance and pt stays in flexed posture throughout test when coming to standing.   9/10: 27.5 sec, Pushes WC post consistently, close CGA  10/22: 47 seconds first attempt 12/5: 1 minute, 37 seconds, multiple attempts to get first stand, but no difficulty after that; 03/20/2023= 1 min 2 sec with 1UE on walker and 1 on armrest- some posterior lean- Close CGA; 06/17/2023= 38 sec with 1 UE on walker and 1 on armrest; 08/21/2023= 35 sec with 1UE on walker and 1 on armrest.  8/5: 36 sec with 1 UE on walker 1 on arm rest; 12/23/2023= 37.05 sec with some posterior LOB- CGA to min A to correct.  Goal status: ONGOING  3.  Pt will increase to > 0.5  m/s (30 sec) as to improve gait speed for better home ambulation and reduce fall risk. Baseline: 10/01/22 0.096 m/s with RW, close CGA, and WC follow previously 12/27: 0.11 m/s 11/04/22: .056 m/s  9/10: 076 m/s 10/22: 0.64 m/s  11/26: .076 m/s; 03/20/2023= 0.08 m/s; 04/22/2023= 0.089 m/s; 05/15/2023 = 0.079 m/s; 05/27/2023= 0.11 m/s; 07/17/2023= 0.15 m/s; 08/21/2023= 0.14 m/s avg with Bariatric walker; 09/09/2023= 0.14 m/s; 10/02/2023= 0.09 m/s with Bariatric walker and Increased cues to swing RLE through vs. Previous sesisons. 11/13/2023= 0.12 m/s with Bariatric walker; 12/11/2023= 0.14 m/s; 12/23/2023=0.135 m/s Goal status: ONGOING  4.  Pt will increase hip flexor and abductor  gross strength to 4+/5 bilat as to improve functional strength for independent gait, increased standing tolerance, and increased ADL ability. Baseline: 10/01/22 4+/5, exception hip flexors and abductors 4/5 each bilat  (previously 03/27/22: 4+/5 ) 9/10: Hip flexor 4/5, hip abductor 4+/5 10/22: hip flexor 4/5 hip abductor 4+/5 11/26: hip flexor 4+/5 hip abductor 4+/5 Goal status: MET  5.  Pt will ambulate 150 ft with BRW and WC follow with no rest breaks in less than 12 min to allow for improved mobility within home and increased independence. Baseline: 10/03/22 150' with BRW 04/03/22: 105 ft with BRW 7/29: 100 in 9:30 with pediatric RW 9/10:101 ft with BRW stooped posture worsened throughout test 12/24/2022- 150 feet using BRW, CGA with w/c follow- in 10 min 17 sec. Will keep goal to make sure he can do this consistently 10/22: 119 ft with BRW 8 min 23 seconds terminated by patient  11/26: 148 ft with BRW 14 min 34 seconds terminated by patient 03/20/2023= 152 feet using BRW, CGA and w/c follow in 14 min 3 sec- Need to keep goal active to ensure consistency but will revised and add  a time element to goal. 04/22/2023= 162 feet in 11 min 54 sec using bariatric RW, CGA and close CGA assist; 05/27/2023- 170 feet without rest break using bariatric  RW. 06/17/2023= 160 feet with bariatric 4WW in 9 min- stopped due to fatigue.  Goal status: MET   6. Pt will decrease TUG to below 1 min 30 seconds/decrease in order to demonstrate decreased fall risk. Baseline: 06/17/2023= 2 min using bariatric 4WW; 09/09/2023= 1 min 50 sec with bariatric front wheeled walker. 12/02/2023= 1 min 44 sec; 12/23/2023= 2 min 8 sec avg with BRW Goal status: ONGOING  7. Pt will ambulate 300 ft with BRW, CGA and WC follow with no rest breaks in less than 20 min to allow for improved mobility within home/community and increased independence. Baseline: 06/17/2023= 160 feet with bariatric 4WW in 9 min- stopped due to fatigue. 07/17/2023- Patient able to walk 270 feet in > 20 min with BRW, CGA and close w/c follow today- VC for posture and for gait sequencing; 08/21/2023- Not specifically testing but patient has been walking 160 feet or more each Visit; 09/09/2023= Patient ambulated 180 feet overall at one time today using bariatric RW, CGA with VC for erect posture and for increased step length. 10/02/2023= 165 feet with Bariatric RW- close w/c follow and CGA. More diffiuclty with gait sequencing more consistent VC for erect posture. 12/02/2023- Paitent ambulated >100 feet in // bars today but will reassess using walker next visit. 12/11/2023= Patient ambulated 204 feet with BRW and Close CGA and w/c follow Goal Status: PROGRESSING  8. Patient will report ability to walk out his front door including stepping over threshold using his walker with min A of caregiver to enable him to be more independent with household mobility and be able to go out to porch with less dependence on caregivers. Baseline: Patient dependent on caregivers to push him in his wheelchair to go out to porch and reports dependent on them to always assist so he doesn't go outside like he would like to. Goal status: NEW  ASSESSMENT :  CLINICAL IMPRESSION:   Patient exhibited increased difficulty with swinging RLE past LLE  today- more of a step to pattern with slower overall gait speed which corresponded to decrease overal gait distance. Patient with difficulty accepting VC and when asked upon resting why he couldn't take a longer step he replied I don't know my brain just couldn't get my let to do it. Later in session - focused specifically on swing phase with RLE and patient struggled yet able to kick the positioned cone to emphasize longer stride. Pt will continue to benefit from skilled physical therapy intervention to address impairments, improve QOL, and attain therapy goals.     OBJECTIVE IMPAIRMENTS Abnormal gait, decreased activity tolerance, decreased balance, decreased coordination, decreased endurance, decreased mobility, difficulty walking, decreased ROM, decreased strength, improper body mechanics, postural dysfunction, and obesity.   ACTIVITY LIMITATIONS carrying, lifting, bending, sitting, standing, squatting, stairs, transfers, bed mobility, continence, bathing, toileting, dressing, hygiene/grooming, and locomotion level  PARTICIPATION LIMITATIONS: meal prep, cleaning, laundry, interpersonal relationship, driving, shopping, community activity, and yard work  PERSONAL FACTORS Fitness, Past/current experiences, Time since onset of injury/illness/exacerbation, and 3+ comorbidities: hx of brain tumor, past CVA, HTN, obesity, learning disability, DMII are also affecting patient's functional outcome.   REHAB POTENTIAL: Fair    CLINICAL DECISION MAKING: Stable/uncomplicated  EVALUATION COMPLEXITY: Low  PLAN: PT FREQUENCY: 1-2x/week  PT DURATION: 12 weeks  PLANNED INTERVENTIONS: Therapeutic exercises, Therapeutic activity, Neuromuscular re-education, Balance  training, Gait training, Patient/Family education, Joint mobilization, Stair training, Vestibular training, Orthotic/Fit training, DME instructions, Dry Needling, Electrical stimulation, Wheelchair mobility training, Cryotherapy, Moist heat,  Compression bandaging, Manual therapy, and Re-evaluation  PLAN FOR NEXT SESSION:   -Bridging and review of some bed LE strengthening -Continue with functional strengthening exercises of the LE's  -Gait activities using RW. Incorporate proper technique with all transfers to decrease his risk of falling.   Reyes LOISE London PT  Physical Therapist- [place]  [facility]   4:00 PM 12/30/23    OUTPATIENT PHYSICAL THERAPY TREATMENT  Patient Name: Curtis Powell MRN: [id number] DOB:07-05-1965, 58 y.o., male Today's Date: 12/30/2023  PCP: Sherial Bail, MD  REFERRING PROVIDER: Sherial Bail, MD    PT End of Session - 12/30/23 1410     Visit Number 112    Number of Visits 130    Date for Recertification  02/24/24    Progress Note Due on Visit 120    PT Start Time 1317    PT Stop Time 1400    PT Time Calculation (min) 43 min    Equipment Utilized During Treatment Gait belt    Activity Tolerance Patient tolerated treatment well    Behavior During Therapy WFL for tasks assessed/performed               Past Medical History:  Diagnosis Date   Asthma    GERD (gastroesophageal reflux disease)    Hyperlipidemia    Hypertension    Obesity    Stroke [facility])    Past Surgical History:  Procedure Laterality Date   BRAIN SURGERY     Patient Active Problem List   Diagnosis Date Noted   Impaired glucose tolerance 03/23/2020   Learning disability 03/23/2020   Urinary and fecal incontinence 06/14/2019   Chronic pain syndrome 03/23/2019   Constipation 03/23/2019   Dysarthria 03/23/2019   Dysphagia 03/23/2019   Learning difficulty 03/23/2019   Morbid obesity (HCC) 03/23/2019   Muscle weakness 03/23/2019   Vitamin D deficiency 03/23/2019   Bilateral carotid artery stenosis 12/15/2018   Moderate aortic valve stenosis 12/15/2018   BMI 45.0-49.9, adult (HCC) 11/10/2018   Cerebrovascular accident (HCC) 05/02/2016   Hemiparesis affecting  right side as late effect of cerebrovascular accident (HCC) 05/01/2016   Brain tumor (HCC) 11/24/2014   Gastro-esophageal reflux disease without esophagitis 11/24/2014   Accident due to mechanical fall without injury 08/03/2014   Essential hypertension 06/13/2014   Mixed hyperlipidemia 06/13/2014   Unspecified sequelae of cerebral infarction 06/13/2014   Thyromegaly 01/07/2014   Type 2 diabetes mellitus without complication 01/06/2014   Neck mass 09/08/2013   Swimmer's ear 09/08/2013   Erectile dysfunction 01/03/2013   Prediabetes 01/03/2013   ONSET DATE: 05/09/2016  REFERRING DIAG: I69.351 (ICD-10-CM) - Hemiplegia and hemiparesis following cerebral infarction affecting right dominant side   THERAPY DIAG:  Difficulty in walking, not elsewhere classified  Unsteadiness on feet  Muscle weakness (generalized)  Abnormality of gait and mobility  Left-sided weakness  Repeated falls  Other abnormalities of gait and mobility   Rationale for Evaluation and Treatment Rehabilitation  SUBJECTIVE:  SUBJECTIVE STATEMENT  Patient reports trying to stay compliant with walking daily.    Pt accompanied by: aunt  PERTINENT HISTORY:  The pt is a pleasant 58 yo male who returns to PT for evaluation of deficits s/o old CVA. Pt with hx of CVA Feb 2018. He was previously fully independent, but CVA caused significant deficits, mainly affecting his right side. Pt reports continued difficulty with walking and transfers. Pt still using WC for majority of mobility, otherwise he uses his RW. Pt lives with his mom who is his main caregiver. Patient had outpatient PT previously in clinic in 2023. He reports since discharge he has been completing his HEP and walking about every day. Pt would like to improve his walking  mainly, but also would like to work on balance. He also reports difficulty with transfers. Pt's mom and his caregiver helps him with ADLs. However, he says he is now waiting for a new caregiver. Prior to this pt had caregiver 3x/week. PMH significant for hx of brain tumor, past CVA, DMII and HTN. See chart for additional information.  PAIN:  Are you having pain? Pt reports no pain currently  PRECAUTIONS: fall  WEIGHT BEARING RESTRICTIONS none  PLOF: required assistance with ADLs (old CVA), prior to CVA in 2018 was indep.  History of Falls: Pt reports no recent falls  Home information: Pt lives with mother who is his primary caregiver. Previously had home aide, 3x/week but reports currently waiting for new one.  PATIENT GOALS: Pt would like to improve his ability to ambulate.  OBJECTIVE:  taken at [facility] unless otherwise noted:   DIAGNOSTIC FINDINGS: No recent imaging per chart  Objective measures taken at eval unless otherwise specified:   Posture: forward head posture, rounded shoulders  MMT LE: grossly  4+/5, exception hip flexors and abductors 4/5 each bilat   Functional tests:  5xSTS:  34 sec, use of BUE (multiple initial attempts with posterior LOB in chair)   : 0.096 m/s with RW, close CGA, and WC follow  Gait for distance (goal 150 ft): deferred due to time  Gait mechanics: impaired, decreased speed (see ), crouched posture, poor postural stability and relies heavily on BUE support on RW. Pt now with bilat AFOS   FOTO: 44 (goal 50)   TODAY'S TREATMENT 12/30/23     TA- To improve functional movements patterns for everyday tasks   Nustep - interval training to promote LE strength/cardioresp endurance. PT sets up intervention, adjusts intensity throughout and monitors pt for response. Cuing for SPM/speed, pt maintains SPM in 50s . Patient completed 0.27 mi in 10 min using LE only x 5 min then BUE/LE x 5 min  Ambulation in clinic with Bariatric RW, CGA - with  verbal cues for posture and step length today- Stiff knees and some difficulty with swing phase with RLE yet mostly short swing through on last 1/2 distance (150 feet total)     Sit to stand from nustep x 5 reps (VC for hand position)    PATIENT EDUCATION: Education details: If pt can lean pelvis into support surface, his energy efficicency will be higher.  Person educated: Patient Education method: Explanation, Demonstration, Tactile cues, and Verbal cues Education comprehension: verbalized understanding, returned demonstration, and verbal cues required  HOME EXERCISE PROGRAM:  Standing hip flexor stretch at counter, prolonged standing with erect posture for gluteal activation, HS curls with RTB  Access Code: [email or web address] URL: [email or web address] Date: 05/15/2023 Prepared by: Connell Kiss  Exercises - Seated Long Arc  Quad with Ankle Weight  - 1 x daily - 7 x weekly - 2 sets - 10 reps  GOALS: Goals reviewed with patient? No  SHORT TERM GOALS: Target date: 11/12/2022  Pt will be independent with HEP in order to improve strength and balance in order to decrease fall risk and improve function at home. Baseline: instructed in HEP today, to provide printout next visit Goal status: MET  LONG TERM GOALS: Target date: 02/24/2024  Pt will increase FOTO to at least 50 in order to demonstrate improvement in function related to mobility and QoL. Baseline: 44 7/29:48 9/10:40 12/24/2022= 51 Goal status: MET  2.  Pt (< 60 yrs old) will complete 5xSTS test < 30 seconds indicating an increase in LE strength and improved balance. Baseline: 10/01/22 34 sec (multiple initial attempts with posterior LOB in chair) 12/20: 49 seconds 11/04/22:1:06, min A on multiple attempts to maintain balance and pt stays in flexed posture throughout test when coming to standing.   9/10: 27.5 sec, Pushes WC post consistently, close CGA  10/22: 47 seconds first attempt 12/5: 1 minute, 37 seconds,  multiple attempts to get first stand, but no difficulty after that; 03/20/2023= 1 min 2 sec with 1UE on walker and 1 on armrest- some posterior lean- Close CGA; 06/17/2023= 38 sec with 1 UE on walker and 1 on armrest; 08/21/2023= 35 sec with 1UE on walker and 1 on armrest.  8/5: 36 sec with 1 UE on walker 1 on arm rest  Goal status: ONGOING  3.  Pt will increase to > 0.5 m/s (30 sec) as to improve gait speed for better home ambulation and reduce fall risk. Baseline: 10/01/22 0.096 m/s with RW, close CGA, and WC follow previously 12/27: 0.11 m/s 11/04/22: .056 m/s  9/10: 076 m/s 10/22: 0.64 m/s  11/26: .076 m/s; 03/20/2023= 0.08 m/s; 04/22/2023= 0.089 m/s; 05/15/2023 = 0.079 m/s; 05/27/2023= 0.11 m/s; 07/17/2023= 0.15 m/s; 08/21/2023= 0.14 m/s avg with Bariatric walker; 09/09/2023= 0.14 m/s; 10/02/2023= 0.09 m/s with Bariatric walker and Increased cues to swing RLE through vs. Previous sesisons. 11/13/2023= 0.12 m/s with Bariatric walker; 12/11/2023= 0.14 m/s Goal status: ONGOING  4.  Pt will increase hip flexor and abductor  gross strength to 4+/5 bilat as to improve functional strength for independent gait, increased standing tolerance, and increased ADL ability. Baseline: 10/01/22 4+/5, exception hip flexors and abductors 4/5 each bilat  (previously 03/27/22: 4+/5 ) 9/10: Hip flexor 4/5, hip abductor 4+/5 10/22: hip flexor 4/5 hip abductor 4+/5 11/26: hip flexor 4+/5 hip abductor 4+/5 Goal status: MET  5.  Pt will ambulate 150 ft with BRW and WC follow with no rest breaks in less than 12 min to allow for improved mobility within home and increased independence. Baseline: 10/03/22 150' with BRW 04/03/22: 105 ft with BRW 7/29: 100 in 9:30 with pediatric RW 9/10:101 ft with BRW stooped posture worsened throughout test 12/24/2022- 150 feet using BRW, CGA with w/c follow- in 10 min 17 sec. Will keep goal to make sure he can do this consistently 10/22: 119 ft with BRW 8 min 23 seconds terminated by patient   11/26: 148 ft with BRW 14 min 34 seconds terminated by patient 03/20/2023= 152 feet using BRW, CGA and w/c follow in 14 min 3 sec- Need to keep goal active to ensure consistency but will revised and add a time element to goal. 04/22/2023= 162 feet in 11 min 54 sec using bariatric RW, CGA and close CGA assist; 05/27/2023- 170 feet  without rest break using bariatric RW. 06/17/2023= 160 feet with bariatric 4WW in 9 min- stopped due to fatigue.  Goal status: MET   6. Pt will decrease TUG to below 1 min 30 seconds/decrease in order to demonstrate decreased fall risk. Baseline: 06/17/2023= 2 min using bariatric 4WW; 09/09/2023= 1 min 50 sec with bariatric front wheeled walker. 12/02/2023= 1 min 44 sec  Goal status: PROGRESSING  7. Pt will ambulate 300 ft with BRW, CGA and WC follow with no rest breaks in less than 20 min to allow for improved mobility within home/community and increased independence. Baseline: 06/17/2023= 160 feet with bariatric 4WW in 9 min- stopped due to fatigue. 07/17/2023- Patient able to walk 270 feet in > 20 min with BRW, CGA and close w/c follow today- VC for posture and for gait sequencing; 08/21/2023- Not specifically testing but patient has been walking 160 feet or more each Visit; 09/09/2023= Patient ambulated 180 feet overall at one time today using bariatric RW, CGA with VC for erect posture and for increased step length. 10/02/2023= 165 feet with Bariatric RW- close w/c follow and CGA. More diffiuclty with gait sequencing more consistent VC for erect posture. 12/02/2023- Paitent ambulated >100 feet in // bars today but will reassess using walker next visit. 12/11/2023= Patient ambulated 204 feet with BRW and Close CGA and w/c follow Goal Status: PROGRESSING  8. Patient will report ability to walk out his front door including stepping over threshold using his walker with min A of caregiver to enable him to be more independent with household mobility and be able to go out to porch with less  dependence on caregivers. Baseline: Patient dependent on caregivers to push him in his wheelchair to go out to porch and reports dependent on them to always assist so he doesn't go outside like he would like to. Goal status: NEW  ASSESSMENT :  CLINICAL IMPRESSION:   Overall, patient able to focus on endurance - initally with use of Nustep- continuously able to propel his UE/LE with min reminders to keep SPM > 50 and then later with walking. Overall- patient was mobile for majority of session today with minimal rest. Pt will continue to benefit from skilled physical therapy intervention to address impairments, improve QOL, and attain therapy goals.     OBJECTIVE IMPAIRMENTS Abnormal gait, decreased activity tolerance, decreased balance, decreased coordination, decreased endurance, decreased mobility, difficulty walking, decreased ROM, decreased strength, improper body mechanics, postural dysfunction, and obesity.   ACTIVITY LIMITATIONS carrying, lifting, bending, sitting, standing, squatting, stairs, transfers, bed mobility, continence, bathing, toileting, dressing, hygiene/grooming, and locomotion level  PARTICIPATION LIMITATIONS: meal prep, cleaning, laundry, interpersonal relationship, driving, shopping, community activity, and yard work  PERSONAL FACTORS Fitness, Past/current experiences, Time since onset of injury/illness/exacerbation, and 3+ comorbidities: hx of brain tumor, past CVA, HTN, obesity, learning disability, DMII are also affecting patient's functional outcome.   REHAB POTENTIAL: Fair    CLINICAL DECISION MAKING: Stable/uncomplicated  EVALUATION COMPLEXITY: Low  PLAN: PT FREQUENCY: 1-2x/week  PT DURATION: 12 weeks  PLANNED INTERVENTIONS: Therapeutic exercises, Therapeutic activity, Neuromuscular re-education, Balance training, Gait training, Patient/Family education, Joint mobilization, Stair training, Vestibular training, Orthotic/Fit training, DME instructions, Dry  Needling, Electrical stimulation, Wheelchair mobility training, Cryotherapy, Moist heat, Compression bandaging, Manual therapy, and Re-evaluation  PLAN FOR NEXT SESSION:   -Bridging and review of some bed LE strengthening -Continue with functional strengthening exercises of the LE's  -Gait activities using RW. Incorporate proper technique with all transfers to decrease his risk of falling.  Reyes LOISE London PT  Physical Therapist- [place]   [facility]   4:00 PM 12/30/23

## 2024-01-01 ENCOUNTER — Ambulatory Visit

## 2024-01-01 NOTE — Therapy (Incomplete)
 OUTPATIENT PHYSICAL THERAPY TREATMENT   Patient Name: Curtis Powell MRN: [id number] DOB:Feb 14, 1966, 58 y.o., male Today's Date: 01/01/2024  PCP: Sherial Bail, MD  REFERRING PROVIDER: Sherial Bail, MD           Past Medical History:  Diagnosis Date   Asthma    GERD (gastroesophageal reflux disease)    Hyperlipidemia    Hypertension    Obesity    Stroke [facility])    Past Surgical History:  Procedure Laterality Date   BRAIN SURGERY     Patient Active Problem List   Diagnosis Date Noted   Impaired glucose tolerance 03/23/2020   Learning disability 03/23/2020   Urinary and fecal incontinence 06/14/2019   Chronic pain syndrome 03/23/2019   Constipation 03/23/2019   Dysarthria 03/23/2019   Dysphagia 03/23/2019   Learning difficulty 03/23/2019   Morbid obesity (HCC) 03/23/2019   Muscle weakness 03/23/2019   Vitamin D deficiency 03/23/2019   Bilateral carotid artery stenosis 12/15/2018   Moderate aortic valve stenosis 12/15/2018   BMI 45.0-49.9, adult (HCC) 11/10/2018   Cerebrovascular accident (HCC) 05/02/2016   Hemiparesis affecting right side as late effect of cerebrovascular accident (HCC) 05/01/2016   Brain tumor (HCC) 11/24/2014   Gastro-esophageal reflux disease without esophagitis 11/24/2014   Accident due to mechanical fall without injury 08/03/2014   Essential hypertension 06/13/2014   Mixed hyperlipidemia 06/13/2014   Unspecified sequelae of cerebral infarction 06/13/2014   Thyromegaly 01/07/2014   Type 2 diabetes mellitus without complication 01/06/2014   Neck mass 09/08/2013   Swimmer's ear 09/08/2013   Erectile dysfunction 01/03/2013   Prediabetes 01/03/2013   ONSET DATE: 05/09/2016  REFERRING DIAG: I69.351 (ICD-10-CM) - Hemiplegia and hemiparesis following cerebral infarction affecting right dominant side   THERAPY DIAG:  No diagnosis found.   Rationale for Evaluation and Treatment Rehabilitation  SUBJECTIVE:                                                                                                                                                                                              SUBJECTIVE STATEMENT  Patient reports having a good week - states went out to eat for his birthday.   Pt accompanied by: aunt  PERTINENT HISTORY:  The pt is a pleasant 58 yo male who returns to PT for evaluation of deficits s/o old CVA. Pt with hx of CVA Feb 2018. He was previously fully independent, but CVA caused significant deficits, mainly affecting his right side. Pt reports continued difficulty with walking and transfers. Pt still using WC for majority of mobility, otherwise he uses his RW. Pt lives with his mom who is  his main caregiver. Patient had outpatient PT previously in clinic in 2023. He reports since discharge he has been completing his HEP and walking about every day. Pt would like to improve his walking mainly, but also would like to work on balance. He also reports difficulty with transfers. Pt's mom and his caregiver helps him with ADLs. However, he says he is now waiting for a new caregiver. Prior to this pt had caregiver 3x/week. PMH significant for hx of brain tumor, past CVA, DMII and HTN. See chart for additional information.  PAIN:  Are you having pain? Pt reports no pain currently  PRECAUTIONS: fall  WEIGHT BEARING RESTRICTIONS none  PLOF: required assistance with ADLs (old CVA), prior to CVA in 2018 was indep.  History of Falls: Pt reports no recent falls  Home information: Pt lives with mother who is his primary caregiver. Previously had home aide, 3x/week but reports currently waiting for new one.  PATIENT GOALS: Pt would like to improve his ability to ambulate.  OBJECTIVE:  taken at [facility] unless otherwise noted:   DIAGNOSTIC FINDINGS: No recent imaging per chart  Objective measures taken at eval unless otherwise specified:   Posture: forward head posture, rounded shoulders  MMT LE: grossly   4+/5, exception hip flexors and abductors 4/5 each bilat   Functional tests:  5xSTS:  34 sec, use of BUE (multiple initial attempts with posterior LOB in chair)   : 0.096 m/s with RW, close CGA, and WC follow  Gait for distance (goal 150 ft): deferred due to time  Gait mechanics: impaired, decreased speed (see ), crouched posture, poor postural stability and relies heavily on BUE support on RW. Pt now with bilat AFOS   FOTO: 44 (goal 50)   TODAY'S TREATMENT : 01/01/24    TA:   Sit to stand with 1 hand on armrest and the other on walker handle (on R) x 15 reps  Static stand x 2 min without UE support then dynamic UE reaching with only CGA x approx 20 reaches each UE- focusing on overall balance and on erect posture.    Gait: Ambulation with BRW, CGA with B AFO donned and close w/c follow -approx 150 feet today- Stopped due to fatigue.   -Swing phase practice for more efficient gait sequencing- standing in front of w/c using BRW - placing a cone in front of R LE as a goal to achieve with swing through x 20 reps x 2 sets        PATIENT EDUCATION: Education details: If pt can lean pelvis into support surface, his energy efficicency will be higher.  Person educated: Patient Education method: Explanation, Demonstration, Tactile cues, and Verbal cues Education comprehension: verbalized understanding, returned demonstration, and verbal cues required  HOME EXERCISE PROGRAM:  Standing hip flexor stretch at counter, prolonged standing with erect posture for gluteal activation, HS curls with RTB  Access Code: [email or web address] URL: [email or web address] Date: 05/15/2023 Prepared by: Connell Kiss  Exercises - Seated Long Arc Quad with Ankle Weight  - 1 x daily - 7 x weekly - 2 sets - 10 reps  GOALS: Goals reviewed with patient? No  SHORT TERM GOALS: Target date: 11/12/2022  Pt will be independent with HEP in order to improve strength and balance in order to  decrease fall risk and improve function at home. Baseline: instructed in HEP today, to provide printout next visit Goal status: MET  LONG TERM GOALS: Target date: 02/24/2024  Pt will increase FOTO to at least 50  in order to demonstrate improvement in function related to mobility and QoL. Baseline: 44 7/29:48 9/10:40 12/24/2022= 51 Goal status: MET  2.  Pt (< 60 yrs old) will complete 5xSTS test < 30 seconds indicating an increase in LE strength and improved balance. Baseline: 10/01/22 34 sec (multiple initial attempts with posterior LOB in chair) 12/20: 49 seconds 11/04/22:1:06, min A on multiple attempts to maintain balance and pt stays in flexed posture throughout test when coming to standing.   9/10: 27.5 sec, Pushes WC post consistently, close CGA  10/22: 47 seconds first attempt 12/5: 1 minute, 37 seconds, multiple attempts to get first stand, but no difficulty after that; 03/20/2023= 1 min 2 sec with 1UE on walker and 1 on armrest- some posterior lean- Close CGA; 06/17/2023= 38 sec with 1 UE on walker and 1 on armrest; 08/21/2023= 35 sec with 1UE on walker and 1 on armrest.  8/5: 36 sec with 1 UE on walker 1 on arm rest; 12/23/2023= 37.05 sec with some posterior LOB- CGA to min A to correct.  Goal status: ONGOING  3.  Pt will increase to > 0.5 m/s (30 sec) as to improve gait speed for better home ambulation and reduce fall risk. Baseline: 10/01/22 0.096 m/s with RW, close CGA, and WC follow previously 12/27: 0.11 m/s 11/04/22: .056 m/s  9/10: 076 m/s 10/22: 0.64 m/s  11/26: .076 m/s; 03/20/2023= 0.08 m/s; 04/22/2023= 0.089 m/s; 05/15/2023 = 0.079 m/s; 05/27/2023= 0.11 m/s; 07/17/2023= 0.15 m/s; 08/21/2023= 0.14 m/s avg with Bariatric walker; 09/09/2023= 0.14 m/s; 10/02/2023= 0.09 m/s with Bariatric walker and Increased cues to swing RLE through vs. Previous sesisons. 11/13/2023= 0.12 m/s with Bariatric walker; 12/11/2023= 0.14 m/s; 12/23/2023=0.135 m/s Goal status: ONGOING  4.  Pt will increase hip  flexor and abductor  gross strength to 4+/5 bilat as to improve functional strength for independent gait, increased standing tolerance, and increased ADL ability. Baseline: 10/01/22 4+/5, exception hip flexors and abductors 4/5 each bilat  (previously 03/27/22: 4+/5 ) 9/10: Hip flexor 4/5, hip abductor 4+/5 10/22: hip flexor 4/5 hip abductor 4+/5 11/26: hip flexor 4+/5 hip abductor 4+/5 Goal status: MET  5.  Pt will ambulate 150 ft with BRW and WC follow with no rest breaks in less than 12 min to allow for improved mobility within home and increased independence. Baseline: 10/03/22 150' with BRW 04/03/22: 105 ft with BRW 7/29: 100 in 9:30 with pediatric RW 9/10:101 ft with BRW stooped posture worsened throughout test 12/24/2022- 150 feet using BRW, CGA with w/c follow- in 10 min 17 sec. Will keep goal to make sure he can do this consistently 10/22: 119 ft with BRW 8 min 23 seconds terminated by patient  11/26: 148 ft with BRW 14 min 34 seconds terminated by patient 03/20/2023= 152 feet using BRW, CGA and w/c follow in 14 min 3 sec- Need to keep goal active to ensure consistency but will revised and add a time element to goal. 04/22/2023= 162 feet in 11 min 54 sec using bariatric RW, CGA and close CGA assist; 05/27/2023- 170 feet without rest break using bariatric RW. 06/17/2023= 160 feet with bariatric 4WW in 9 min- stopped due to fatigue.  Goal status: MET   6. Pt will decrease TUG to below 1 min 30 seconds/decrease in order to demonstrate decreased fall risk. Baseline: 06/17/2023= 2 min using bariatric 4WW; 09/09/2023= 1 min 50 sec with bariatric front wheeled walker. 12/02/2023= 1 min 44 sec; 12/23/2023= 2 min 8 sec avg with BRW  Goal status: ONGOING  7. Pt will ambulate 300 ft with BRW, CGA and WC follow with no rest breaks in less than 20 min to allow for improved mobility within home/community and increased independence. Baseline: 06/17/2023= 160 feet with bariatric 4WW in 9 min- stopped due to  fatigue. 07/17/2023- Patient able to walk 270 feet in > 20 min with BRW, CGA and close w/c follow today- VC for posture and for gait sequencing; 08/21/2023- Not specifically testing but patient has been walking 160 feet or more each Visit; 09/09/2023= Patient ambulated 180 feet overall at one time today using bariatric RW, CGA with VC for erect posture and for increased step length. 10/02/2023= 165 feet with Bariatric RW- close w/c follow and CGA. More diffiuclty with gait sequencing more consistent VC for erect posture. 12/02/2023- Paitent ambulated >100 feet in // bars today but will reassess using walker next visit. 12/11/2023= Patient ambulated 204 feet with BRW and Close CGA and w/c follow Goal Status: PROGRESSING  8. Patient will report ability to walk out his front door including stepping over threshold using his walker with min A of caregiver to enable him to be more independent with household mobility and be able to go out to porch with less dependence on caregivers. Baseline: Patient dependent on caregivers to push him in his wheelchair to go out to porch and reports dependent on them to always assist so he doesn't go outside like he would like to. Goal status: NEW  ASSESSMENT :  CLINICAL IMPRESSION:   Patient exhibited increased difficulty with swinging RLE past LLE today- more of a step to pattern with slower overall gait speed which corresponded to decrease overal gait distance. Patient with difficulty accepting VC and when asked upon resting why he couldn't take a longer step he replied I don't know my brain just couldn't get my let to do it. Later in session - focused specifically on swing phase with RLE and patient struggled yet able to kick the positioned cone to emphasize longer stride. Pt will continue to benefit from skilled physical therapy intervention to address impairments, improve QOL, and attain therapy goals.     OBJECTIVE IMPAIRMENTS Abnormal gait, decreased activity tolerance,  decreased balance, decreased coordination, decreased endurance, decreased mobility, difficulty walking, decreased ROM, decreased strength, improper body mechanics, postural dysfunction, and obesity.   ACTIVITY LIMITATIONS carrying, lifting, bending, sitting, standing, squatting, stairs, transfers, bed mobility, continence, bathing, toileting, dressing, hygiene/grooming, and locomotion level  PARTICIPATION LIMITATIONS: meal prep, cleaning, laundry, interpersonal relationship, driving, shopping, community activity, and yard work  PERSONAL FACTORS Fitness, Past/current experiences, Time since onset of injury/illness/exacerbation, and 3+ comorbidities: hx of brain tumor, past CVA, HTN, obesity, learning disability, DMII are also affecting patient's functional outcome.   REHAB POTENTIAL: Fair    CLINICAL DECISION MAKING: Stable/uncomplicated  EVALUATION COMPLEXITY: Low  PLAN: PT FREQUENCY: 1-2x/week  PT DURATION: 12 weeks  PLANNED INTERVENTIONS: Therapeutic exercises, Therapeutic activity, Neuromuscular re-education, Balance training, Gait training, Patient/Family education, Joint mobilization, Stair training, Vestibular training, Orthotic/Fit training, DME instructions, Dry Needling, Electrical stimulation, Wheelchair mobility training, Cryotherapy, Moist heat, Compression bandaging, Manual therapy, and Re-evaluation  PLAN FOR NEXT SESSION:   -Bridging and review of some bed LE strengthening -Continue with functional strengthening exercises of the LE's  -Gait activities using RW. Incorporate proper technique with all transfers to decrease his risk of falling.   Reyes LOISE London PT  Physical Therapist- [place]  [facility]   8:40 AM 01/01/24    OUTPATIENT PHYSICAL  THERAPY TREATMENT  Patient Name: Curtis Powell MRN: [id number] DOB:[birth year], 58 y.o., male Today's Date: 01/01/2024  PCP: Sherial Bail, MD  REFERRING PROVIDER: Sherial Bail, MD            Past Medical History:  Diagnosis Date   Asthma    GERD (gastroesophageal reflux disease)    Hyperlipidemia    Hypertension    Obesity    Stroke [facility])    Past Surgical History:  Procedure Laterality Date   BRAIN SURGERY     Patient Active Problem List   Diagnosis Date Noted   Impaired glucose tolerance 03/23/2020   Learning disability 03/23/2020   Urinary and fecal incontinence 06/14/2019   Chronic pain syndrome 03/23/2019   Constipation 03/23/2019   Dysarthria 03/23/2019   Dysphagia 03/23/2019   Learning difficulty 03/23/2019   Morbid obesity (HCC) 03/23/2019   Muscle weakness 03/23/2019   Vitamin D deficiency 03/23/2019   Bilateral carotid artery stenosis 12/15/2018   Moderate aortic valve stenosis 12/15/2018   BMI 45.0-49.9, adult (HCC) 11/10/2018   Cerebrovascular accident (HCC) 05/02/2016   Hemiparesis affecting right side as late effect of cerebrovascular accident (HCC) 05/01/2016   Brain tumor (HCC) 11/24/2014   Gastro-esophageal reflux disease without esophagitis 11/24/2014   Accident due to mechanical fall without injury 08/03/2014   Essential hypertension 06/13/2014   Mixed hyperlipidemia 06/13/2014   Unspecified sequelae of cerebral infarction 06/13/2014   Thyromegaly 01/07/2014   Type 2 diabetes mellitus without complication 01/06/2014   Neck mass 09/08/2013   Swimmer's ear 09/08/2013   Erectile dysfunction 01/03/2013   Prediabetes 01/03/2013   ONSET DATE: 05/09/2016  REFERRING DIAG: I69.351 (ICD-10-CM) - Hemiplegia and hemiparesis following cerebral infarction affecting right dominant side   THERAPY DIAG:  No diagnosis found.   Rationale for Evaluation and Treatment Rehabilitation  SUBJECTIVE:                                                                                                                                                                                             SUBJECTIVE STATEMENT  Patient reports trying to  stay compliant with walking daily.    Pt accompanied by: aunt  PERTINENT HISTORY:  The pt is a pleasant 58 yo male who returns to PT for evaluation of deficits s/o old CVA. Pt with hx of CVA Feb 2018. He was previously fully independent, but CVA caused significant deficits, mainly affecting his right side. Pt reports continued difficulty with walking and transfers. Pt still using WC for majority of mobility, otherwise he uses his RW. Pt lives with his mom who is his main caregiver. Patient had outpatient PT previously  in clinic in 2023. He reports since discharge he has been completing his HEP and walking about every day. Pt would like to improve his walking mainly, but also would like to work on balance. He also reports difficulty with transfers. Pt's mom and his caregiver helps him with ADLs. However, he says he is now waiting for a new caregiver. Prior to this pt had caregiver 3x/week. PMH significant for hx of brain tumor, past CVA, DMII and HTN. See chart for additional information.  PAIN:  Are you having pain? Pt reports no pain currently  PRECAUTIONS: fall  WEIGHT BEARING RESTRICTIONS none  PLOF: required assistance with ADLs (old CVA), prior to CVA in 2018 was indep.  History of Falls: Pt reports no recent falls  Home information: Pt lives with mother who is his primary caregiver. Previously had home aide, 3x/week but reports currently waiting for new one.  PATIENT GOALS: Pt would like to improve his ability to ambulate.  OBJECTIVE:  taken at [facility] unless otherwise noted:   DIAGNOSTIC FINDINGS: No recent imaging per chart  Objective measures taken at eval unless otherwise specified:   Posture: forward head posture, rounded shoulders  MMT LE: grossly  4+/5, exception hip flexors and abductors 4/5 each bilat   Functional tests:  5xSTS:  34 sec, use of BUE (multiple initial attempts with posterior LOB in chair)   : 0.096 m/s with RW, close CGA, and WC follow  Gait for  distance (goal 150 ft): deferred due to time  Gait mechanics: impaired, decreased speed (see ), crouched posture, poor postural stability and relies heavily on BUE support on RW. Pt now with bilat AFOS   FOTO: 44 (goal 50)   TODAY'S TREATMENT 12/30/23     TA- To improve functional movements patterns for everyday tasks   Nustep - interval training to promote LE strength/cardioresp endurance. PT sets up intervention, adjusts intensity throughout and monitors pt for response. Cuing for SPM/speed, pt maintains SPM in 50s . Patient completed 0.27 mi in 10 min using LE only x 5 min then BUE/LE x 5 min  Ambulation in clinic with Bariatric RW, CGA - with verbal cues for posture and step length today- Stiff knees and some difficulty with swing phase with RLE yet mostly short swing through on last 1/2 distance (150 feet total)     Sit to stand from nustep x 5 reps (VC for hand position)    PATIENT EDUCATION: Education details: If pt can lean pelvis into support surface, his energy efficicency will be higher.  Person educated: Patient Education method: Explanation, Demonstration, Tactile cues, and Verbal cues Education comprehension: verbalized understanding, returned demonstration, and verbal cues required  HOME EXERCISE PROGRAM:  Standing hip flexor stretch at counter, prolonged standing with erect posture for gluteal activation, HS curls with RTB  Access Code: [email or web address] URL: [email or web address] Date: 05/15/2023 Prepared by: Connell Kiss  Exercises - Seated Long Arc Quad with Ankle Weight  - 1 x daily - 7 x weekly - 2 sets - 10 reps  GOALS: Goals reviewed with patient? No  SHORT TERM GOALS: Target date: 11/12/2022  Pt will be independent with HEP in order to improve strength and balance in order to decrease fall risk and improve function at home. Baseline: instructed in HEP today, to provide printout next visit Goal status: MET  LONG TERM GOALS: Target date:  02/24/2024  Pt will increase FOTO to at least 50 in order to demonstrate improvement in function related to mobility and QoL. Baseline:  44 7/29:48 9/10:40 12/24/2022= 51 Goal status: MET  2.  Pt (< 60 yrs old) will complete 5xSTS test < 30 seconds indicating an increase in LE strength and improved balance. Baseline: 10/01/22 34 sec (multiple initial attempts with posterior LOB in chair) 12/20: 49 seconds 11/04/22:1:06, min A on multiple attempts to maintain balance and pt stays in flexed posture throughout test when coming to standing.   9/10: 27.5 sec, Pushes WC post consistently, close CGA  10/22: 47 seconds first attempt 12/5: 1 minute, 37 seconds, multiple attempts to get first stand, but no difficulty after that; 03/20/2023= 1 min 2 sec with 1UE on walker and 1 on armrest- some posterior lean- Close CGA; 06/17/2023= 38 sec with 1 UE on walker and 1 on armrest; 08/21/2023= 35 sec with 1UE on walker and 1 on armrest.  8/5: 36 sec with 1 UE on walker 1 on arm rest  Goal status: ONGOING  3.  Pt will increase to > 0.5 m/s (30 sec) as to improve gait speed for better home ambulation and reduce fall risk. Baseline: 10/01/22 0.096 m/s with RW, close CGA, and WC follow previously 12/27: 0.11 m/s 11/04/22: .056 m/s  9/10: 076 m/s 10/22: 0.64 m/s  11/26: .076 m/s; 03/20/2023= 0.08 m/s; 04/22/2023= 0.089 m/s; 05/15/2023 = 0.079 m/s; 05/27/2023= 0.11 m/s; 07/17/2023= 0.15 m/s; 08/21/2023= 0.14 m/s avg with Bariatric walker; 09/09/2023= 0.14 m/s; 10/02/2023= 0.09 m/s with Bariatric walker and Increased cues to swing RLE through vs. Previous sesisons. 11/13/2023= 0.12 m/s with Bariatric walker; 12/11/2023= 0.14 m/s Goal status: ONGOING  4.  Pt will increase hip flexor and abductor  gross strength to 4+/5 bilat as to improve functional strength for independent gait, increased standing tolerance, and increased ADL ability. Baseline: 10/01/22 4+/5, exception hip flexors and abductors 4/5 each bilat  (previously 03/27/22:  4+/5 ) 9/10: Hip flexor 4/5, hip abductor 4+/5 10/22: hip flexor 4/5 hip abductor 4+/5 11/26: hip flexor 4+/5 hip abductor 4+/5 Goal status: MET  5.  Pt will ambulate 150 ft with BRW and WC follow with no rest breaks in less than 12 min to allow for improved mobility within home and increased independence. Baseline: 10/03/22 150' with BRW 04/03/22: 105 ft with BRW 7/29: 100 in 9:30 with pediatric RW 9/10:101 ft with BRW stooped posture worsened throughout test 12/24/2022- 150 feet using BRW, CGA with w/c follow- in 10 min 17 sec. Will keep goal to make sure he can do this consistently 10/22: 119 ft with BRW 8 min 23 seconds terminated by patient  11/26: 148 ft with BRW 14 min 34 seconds terminated by patient 03/20/2023= 152 feet using BRW, CGA and w/c follow in 14 min 3 sec- Need to keep goal active to ensure consistency but will revised and add a time element to goal. 04/22/2023= 162 feet in 11 min 54 sec using bariatric RW, CGA and close CGA assist; 05/27/2023- 170 feet without rest break using bariatric RW. 06/17/2023= 160 feet with bariatric 4WW in 9 min- stopped due to fatigue.  Goal status: MET   6. Pt will decrease TUG to below 1 min 30 seconds/decrease in order to demonstrate decreased fall risk. Baseline: 06/17/2023= 2 min using bariatric 4WW; 09/09/2023= 1 min 50 sec with bariatric front wheeled walker. 12/02/2023= 1 min 44 sec  Goal status: PROGRESSING  7. Pt will ambulate 300 ft with BRW, CGA and WC follow with no rest breaks in less than 20 min to allow for improved mobility within home/community and increased independence.  Baseline: 06/17/2023= 160 feet with bariatric 4WW in 9 min- stopped due to fatigue. 07/17/2023- Patient able to walk 270 feet in > 20 min with BRW, CGA and close w/c follow today- VC for posture and for gait sequencing; 08/21/2023- Not specifically testing but patient has been walking 160 feet or more each Visit; 09/09/2023= Patient ambulated 180 feet overall at one time today  using bariatric RW, CGA with VC for erect posture and for increased step length. 10/02/2023= 165 feet with Bariatric RW- close w/c follow and CGA. More diffiuclty with gait sequencing more consistent VC for erect posture. 12/02/2023- Paitent ambulated >100 feet in // bars today but will reassess using walker next visit. 12/11/2023= Patient ambulated 204 feet with BRW and Close CGA and w/c follow Goal Status: PROGRESSING  8. Patient will report ability to walk out his front door including stepping over threshold using his walker with min A of caregiver to enable him to be more independent with household mobility and be able to go out to porch with less dependence on caregivers. Baseline: Patient dependent on caregivers to push him in his wheelchair to go out to porch and reports dependent on them to always assist so he doesn't go outside like he would like to. Goal status: NEW  ASSESSMENT :  CLINICAL IMPRESSION:   Overall, patient able to focus on endurance - initally with use of Nustep- continuously able to propel his UE/LE with min reminders to keep SPM > 50 and then later with walking. Overall- patient was mobile for majority of session today with minimal rest. Pt will continue to benefit from skilled physical therapy intervention to address impairments, improve QOL, and attain therapy goals.     OBJECTIVE IMPAIRMENTS Abnormal gait, decreased activity tolerance, decreased balance, decreased coordination, decreased endurance, decreased mobility, difficulty walking, decreased ROM, decreased strength, improper body mechanics, postural dysfunction, and obesity.   ACTIVITY LIMITATIONS carrying, lifting, bending, sitting, standing, squatting, stairs, transfers, bed mobility, continence, bathing, toileting, dressing, hygiene/grooming, and locomotion level  PARTICIPATION LIMITATIONS: meal prep, cleaning, laundry, interpersonal relationship, driving, shopping, community activity, and yard work  PERSONAL  FACTORS Fitness, Past/current experiences, Time since onset of injury/illness/exacerbation, and 3+ comorbidities: hx of brain tumor, past CVA, HTN, obesity, learning disability, DMII are also affecting patient's functional outcome.   REHAB POTENTIAL: Fair    CLINICAL DECISION MAKING: Stable/uncomplicated  EVALUATION COMPLEXITY: Low  PLAN: PT FREQUENCY: 1-2x/week  PT DURATION: 12 weeks  PLANNED INTERVENTIONS: Therapeutic exercises, Therapeutic activity, Neuromuscular re-education, Balance training, Gait training, Patient/Family education, Joint mobilization, Stair training, Vestibular training, Orthotic/Fit training, DME instructions, Dry Needling, Electrical stimulation, Wheelchair mobility training, Cryotherapy, Moist heat, Compression bandaging, Manual therapy, and Re-evaluation  PLAN FOR NEXT SESSION:   -Bridging and review of some bed LE strengthening -Continue with functional strengthening exercises of the LE's  -Gait activities using RW. Incorporate proper technique with all transfers to decrease his risk of falling.   Reyes LOISE London PT  Physical Therapist- [facility]   8:40 AM 01/01/24

## 2024-01-06 ENCOUNTER — Ambulatory Visit

## 2024-01-06 DIAGNOSIS — R531 Weakness: Secondary | ICD-10-CM

## 2024-01-06 DIAGNOSIS — R269 Unspecified abnormalities of gait and mobility: Secondary | ICD-10-CM

## 2024-01-06 DIAGNOSIS — R2689 Other abnormalities of gait and mobility: Secondary | ICD-10-CM

## 2024-01-06 DIAGNOSIS — I69351 Hemiplegia and hemiparesis following cerebral infarction affecting right dominant side: Secondary | ICD-10-CM

## 2024-01-06 DIAGNOSIS — R262 Difficulty in walking, not elsewhere classified: Secondary | ICD-10-CM

## 2024-01-06 DIAGNOSIS — R278 Other lack of coordination: Secondary | ICD-10-CM

## 2024-01-06 DIAGNOSIS — R2681 Unsteadiness on feet: Secondary | ICD-10-CM | POA: Diagnosis not present

## 2024-01-06 DIAGNOSIS — M6281 Muscle weakness (generalized): Secondary | ICD-10-CM

## 2024-01-06 DIAGNOSIS — R296 Repeated falls: Secondary | ICD-10-CM

## 2024-01-06 NOTE — Therapy (Signed)
 OUTPATIENT PHYSICAL THERAPY TREATMENT   Patient Name: Curtis Powell MRN: [id number] DOB:1966/03/28, 58 y.o., male Today's Date: 01/06/2024  PCP: Sherial Bail, MD  REFERRING PROVIDER: Sherial Bail, MD    PT End of Session - 01/06/24 1304     Visit Number 113    Number of Visits 130    Date for Recertification  02/24/24    Progress Note Due on Visit 120    PT Start Time 1310    PT Stop Time 1358    PT Time Calculation (min) 48 min    Equipment Utilized During Treatment Gait belt    Activity Tolerance Patient tolerated treatment well    Behavior During Therapy WFL for tasks assessed/performed                Past Medical History:  Diagnosis Date   Asthma    GERD (gastroesophageal reflux disease)    Hyperlipidemia    Hypertension    Obesity    Stroke [facility])    Past Surgical History:  Procedure Laterality Date   BRAIN SURGERY     Patient Active Problem List   Diagnosis Date Noted   Impaired glucose tolerance 03/23/2020   Learning disability 03/23/2020   Urinary and fecal incontinence 06/14/2019   Chronic pain syndrome 03/23/2019   Constipation 03/23/2019   Dysarthria 03/23/2019   Dysphagia 03/23/2019   Learning difficulty 03/23/2019   Morbid obesity (HCC) 03/23/2019   Muscle weakness 03/23/2019   Vitamin D deficiency 03/23/2019   Bilateral carotid artery stenosis 12/15/2018   Moderate aortic valve stenosis 12/15/2018   BMI 45.0-49.9, adult (HCC) 11/10/2018   Cerebrovascular accident (HCC) 05/02/2016   Hemiparesis affecting right side as late effect of cerebrovascular accident (HCC) 05/01/2016   Brain tumor (HCC) 11/24/2014   Gastro-esophageal reflux disease without esophagitis 11/24/2014   Accident due to mechanical fall without injury 08/03/2014   Essential hypertension 06/13/2014   Mixed hyperlipidemia 06/13/2014   Unspecified sequelae of cerebral infarction 06/13/2014   Thyromegaly 01/07/2014   Type 2 diabetes mellitus without  complication 01/06/2014   Neck mass 09/08/2013   Swimmer's ear 09/08/2013   Erectile dysfunction 01/03/2013   Prediabetes 01/03/2013   ONSET DATE: 05/09/2016  REFERRING DIAG: I69.351 (ICD-10-CM) - Hemiplegia and hemiparesis following cerebral infarction affecting right dominant side   THERAPY DIAG:  Difficulty in walking, not elsewhere classified  Unsteadiness on feet  Muscle weakness (generalized)  Abnormality of gait and mobility  Left-sided weakness  Repeated falls  Other abnormalities of gait and mobility  Hemiplegia and hemiparesis following cerebral infarction affecting right dominant side (HCC)  Other lack of coordination   Rationale for Evaluation and Treatment Rehabilitation  SUBJECTIVE:  SUBJECTIVE STATEMENT  Patient reports having a good week - states went out to eat for his birthday.   Pt accompanied by: aunt  PERTINENT HISTORY:  The pt is a pleasant 58 yo male who returns to PT for evaluation of deficits s/o old CVA. Pt with hx of CVA Feb 2018. He was previously fully independent, but CVA caused significant deficits, mainly affecting his right side. Pt reports continued difficulty with walking and transfers. Pt still using WC for majority of mobility, otherwise he uses his RW. Pt lives with his mom who is his main caregiver. Patient had outpatient PT previously in clinic in 2023. He reports since discharge he has been completing his HEP and walking about every day. Pt would like to improve his walking mainly, but also would like to work on balance. He also reports difficulty with transfers. Pt's mom and his caregiver helps him with ADLs. However, he says he is now waiting for a new caregiver. Prior to this pt had caregiver 3x/week. PMH significant for hx of brain tumor, past CVA,  DMII and HTN. See chart for additional information.  PAIN:  Are you having pain? Pt reports no pain currently  PRECAUTIONS: fall  WEIGHT BEARING RESTRICTIONS none  PLOF: required assistance with ADLs (old CVA), prior to CVA in 2018 was indep.  History of Falls: Pt reports no recent falls  Home information: Pt lives with mother who is his primary caregiver. Previously had home aide, 3x/week but reports currently waiting for new one.  PATIENT GOALS: Pt would like to improve his ability to ambulate.  OBJECTIVE:  taken at [facility] unless otherwise noted:   DIAGNOSTIC FINDINGS: No recent imaging per chart  Objective measures taken at eval unless otherwise specified:   Posture: forward head posture, rounded shoulders  MMT LE: grossly  4+/5, exception hip flexors and abductors 4/5 each bilat   Functional tests:  5xSTS:  34 sec, use of BUE (multiple initial attempts with posterior LOB in chair)   : 0.096 m/s with RW, close CGA, and WC follow  Gait for distance (goal 150 ft): deferred due to time  Gait mechanics: impaired, decreased speed (see ), crouched posture, poor postural stability and relies heavily on BUE support on RW. Pt now with bilat AFOS   FOTO: 44 (goal 50)   TODAY'S TREATMENT : 01/06/24    TA:   Sit to stand with 1 hand on armrest and the other on walker handle (on R) x 15 reps  Static stand x 2 min without UE support then dynamic UE reaching with only CGA x approx 20 reaches each UE- focusing on overall balance and on erect posture.    Gait: Ambulation with BRW, CGA with B AFO donned and close w/c follow -approx 150 feet today- Stopped due to fatigue.   -Swing phase practice for more efficient gait sequencing- standing in front of w/c using BRW - placing a cone in front of R LE as a goal to achieve with swing through x 20 reps x 2 sets        PATIENT EDUCATION: Education details: If pt can lean pelvis into support surface, his energy  efficicency will be higher.  Person educated: Patient Education method: Explanation, Demonstration, Tactile cues, and Verbal cues Education comprehension: verbalized understanding, returned demonstration, and verbal cues required  HOME EXERCISE PROGRAM:  Standing hip flexor stretch at counter, prolonged standing with erect posture for gluteal activation, HS curls with RTB  Access Code: [email or web address] URL: [email or web address] Date: 05/15/2023  Prepared by: Connell Kiss  Exercises - Seated Long Arc Quad with Ankle Weight  - 1 x daily - 7 x weekly - 2 sets - 10 reps  GOALS: Goals reviewed with patient? No  SHORT TERM GOALS: Target date: 11/12/2022  Pt will be independent with HEP in order to improve strength and balance in order to decrease fall risk and improve function at home. Baseline: instructed in HEP today, to provide printout next visit Goal status: MET  LONG TERM GOALS: Target date: 02/24/2024  Pt will increase FOTO to at least 50 in order to demonstrate improvement in function related to mobility and QoL. Baseline: 44 7/29:48 9/10:40 12/24/2022= 51 Goal status: MET  2.  Pt (< 60 yrs old) will complete 5xSTS test < 30 seconds indicating an increase in LE strength and improved balance. Baseline: 10/01/22 34 sec (multiple initial attempts with posterior LOB in chair) 12/20: 49 seconds 11/04/22:1:06, min A on multiple attempts to maintain balance and pt stays in flexed posture throughout test when coming to standing.   9/10: 27.5 sec, Pushes WC post consistently, close CGA  10/22: 47 seconds first attempt 12/5: 1 minute, 37 seconds, multiple attempts to get first stand, but no difficulty after that; 03/20/2023= 1 min 2 sec with 1UE on walker and 1 on armrest- some posterior lean- Close CGA; 06/17/2023= 38 sec with 1 UE on walker and 1 on armrest; 08/21/2023= 35 sec with 1UE on walker and 1 on armrest.  8/5: 36 sec with 1 UE on walker 1 on arm rest; 12/23/2023= 37.05 sec  with some posterior LOB- CGA to min A to correct.  Goal status: ONGOING  3.  Pt will increase to > 0.5 m/s (30 sec) as to improve gait speed for better home ambulation and reduce fall risk. Baseline: 10/01/22 0.096 m/s with RW, close CGA, and WC follow previously 12/27: 0.11 m/s 11/04/22: .056 m/s  9/10: 076 m/s 10/22: 0.64 m/s  11/26: .076 m/s; 03/20/2023= 0.08 m/s; 04/22/2023= 0.089 m/s; 05/15/2023 = 0.079 m/s; 05/27/2023= 0.11 m/s; 07/17/2023= 0.15 m/s; 08/21/2023= 0.14 m/s avg with Bariatric walker; 09/09/2023= 0.14 m/s; 10/02/2023= 0.09 m/s with Bariatric walker and Increased cues to swing RLE through vs. Previous sesisons. 11/13/2023= 0.12 m/s with Bariatric walker; 12/11/2023= 0.14 m/s; 12/23/2023=0.135 m/s Goal status: ONGOING  4.  Pt will increase hip flexor and abductor  gross strength to 4+/5 bilat as to improve functional strength for independent gait, increased standing tolerance, and increased ADL ability. Baseline: 10/01/22 4+/5, exception hip flexors and abductors 4/5 each bilat  (previously 03/27/22: 4+/5 ) 9/10: Hip flexor 4/5, hip abductor 4+/5 10/22: hip flexor 4/5 hip abductor 4+/5 11/26: hip flexor 4+/5 hip abductor 4+/5 Goal status: MET  5.  Pt will ambulate 150 ft with BRW and WC follow with no rest breaks in less than 12 min to allow for improved mobility within home and increased independence. Baseline: 10/03/22 150' with BRW 04/03/22: 105 ft with BRW 7/29: 100 in 9:30 with pediatric RW 9/10:101 ft with BRW stooped posture worsened throughout test 12/24/2022- 150 feet using BRW, CGA with w/c follow- in 10 min 17 sec. Will keep goal to make sure he can do this consistently 10/22: 119 ft with BRW 8 min 23 seconds terminated by patient  11/26: 148 ft with BRW 14 min 34 seconds terminated by patient 03/20/2023= 152 feet using BRW, CGA and w/c follow in 14 min 3 sec- Need to keep goal active to ensure consistency but will revised and add  a time element to goal. 04/22/2023= 162 feet in 11  min 54 sec using bariatric RW, CGA and close CGA assist; 05/27/2023- 170 feet without rest break using bariatric RW. 06/17/2023= 160 feet with bariatric 4WW in 9 min- stopped due to fatigue.  Goal status: MET   6. Pt will decrease TUG to below 1 min 30 seconds/decrease in order to demonstrate decreased fall risk. Baseline: 06/17/2023= 2 min using bariatric 4WW; 09/09/2023= 1 min 50 sec with bariatric front wheeled walker. 12/02/2023= 1 min 44 sec; 12/23/2023= 2 min 8 sec avg with BRW Goal status: ONGOING  7. Pt will ambulate 300 ft with BRW, CGA and WC follow with no rest breaks in less than 20 min to allow for improved mobility within home/community and increased independence. Baseline: 06/17/2023= 160 feet with bariatric 4WW in 9 min- stopped due to fatigue. 07/17/2023- Patient able to walk 270 feet in > 20 min with BRW, CGA and close w/c follow today- VC for posture and for gait sequencing; 08/21/2023- Not specifically testing but patient has been walking 160 feet or more each Visit; 09/09/2023= Patient ambulated 180 feet overall at one time today using bariatric RW, CGA with VC for erect posture and for increased step length. 10/02/2023= 165 feet with Bariatric RW- close w/c follow and CGA. More diffiuclty with gait sequencing more consistent VC for erect posture. 12/02/2023- Paitent ambulated >100 feet in // bars today but will reassess using walker next visit. 12/11/2023= Patient ambulated 204 feet with BRW and Close CGA and w/c follow Goal Status: PROGRESSING  8. Patient will report ability to walk out his front door including stepping over threshold using his walker with min A of caregiver to enable him to be more independent with household mobility and be able to go out to porch with less dependence on caregivers. Baseline: Patient dependent on caregivers to push him in his wheelchair to go out to porch and reports dependent on them to always assist so he doesn't go outside like he would like to. Goal status:  NEW  ASSESSMENT :  CLINICAL IMPRESSION:   Patient exhibited increased difficulty with swinging RLE past LLE today- more of a step to pattern with slower overall gait speed which corresponded to decrease overal gait distance. Patient with difficulty accepting VC and when asked upon resting why he couldn't take a longer step he replied I don't know my brain just couldn't get my let to do it. Later in session - focused specifically on swing phase with RLE and patient struggled yet able to kick the positioned cone to emphasize longer stride. Pt will continue to benefit from skilled physical therapy intervention to address impairments, improve QOL, and attain therapy goals.     OBJECTIVE IMPAIRMENTS Abnormal gait, decreased activity tolerance, decreased balance, decreased coordination, decreased endurance, decreased mobility, difficulty walking, decreased ROM, decreased strength, improper body mechanics, postural dysfunction, and obesity.   ACTIVITY LIMITATIONS carrying, lifting, bending, sitting, standing, squatting, stairs, transfers, bed mobility, continence, bathing, toileting, dressing, hygiene/grooming, and locomotion level  PARTICIPATION LIMITATIONS: meal prep, cleaning, laundry, interpersonal relationship, driving, shopping, community activity, and yard work  PERSONAL FACTORS Fitness, Past/current experiences, Time since onset of injury/illness/exacerbation, and 3+ comorbidities: hx of brain tumor, past CVA, HTN, obesity, learning disability, DMII are also affecting patient's functional outcome.   REHAB POTENTIAL: Fair    CLINICAL DECISION MAKING: Stable/uncomplicated  EVALUATION COMPLEXITY: Low  PLAN: PT FREQUENCY: 1-2x/week  PT DURATION: 12 weeks  PLANNED INTERVENTIONS: Therapeutic exercises, Therapeutic activity, Neuromuscular re-education, Balance  training, Gait training, Patient/Family education, Joint mobilization, Stair training, Vestibular training, Orthotic/Fit training, DME  instructions, Dry Needling, Electrical stimulation, Wheelchair mobility training, Cryotherapy, Moist heat, Compression bandaging, Manual therapy, and Re-evaluation  PLAN FOR NEXT SESSION:   -Bridging and review of some bed LE strengthening -Continue with functional strengthening exercises of the LE's  -Gait activities using RW. Incorporate proper technique with all transfers to decrease his risk of falling.   Reyes LOISE London PT  Physical Therapist- [place]  [facility]   2:03 PM 01/06/24    OUTPATIENT PHYSICAL THERAPY TREATMENT  Patient Name: Curtis Powell MRN: [id number] DOB:[birth year], 58 y.o., male Today's Date: 01/06/2024  PCP: Sherial Bail, MD  REFERRING PROVIDER: Sherial Bail, MD    PT End of Session - 01/06/24 1304     Visit Number 113    Number of Visits 130    Date for Recertification  02/24/24    Progress Note Due on Visit 120    PT Start Time 1310    PT Stop Time 1358    PT Time Calculation (min) 48 min    Equipment Utilized During Treatment Gait belt    Activity Tolerance Patient tolerated treatment well    Behavior During Therapy WFL for tasks assessed/performed                Past Medical History:  Diagnosis Date   Asthma    GERD (gastroesophageal reflux disease)    Hyperlipidemia    Hypertension    Obesity    Stroke [facility])    Past Surgical History:  Procedure Laterality Date   BRAIN SURGERY     Patient Active Problem List   Diagnosis Date Noted   Impaired glucose tolerance 03/23/2020   Learning disability 03/23/2020   Urinary and fecal incontinence 06/14/2019   Chronic pain syndrome 03/23/2019   Constipation 03/23/2019   Dysarthria 03/23/2019   Dysphagia 03/23/2019   Learning difficulty 03/23/2019   Morbid obesity (HCC) 03/23/2019   Muscle weakness 03/23/2019   Vitamin D deficiency 03/23/2019   Bilateral carotid artery stenosis 12/15/2018   Moderate aortic valve stenosis 12/15/2018   BMI  45.0-49.9, adult (HCC) 11/10/2018   Cerebrovascular accident (HCC) 05/02/2016   Hemiparesis affecting right side as late effect of cerebrovascular accident (HCC) 05/01/2016   Brain tumor (HCC) 11/24/2014   Gastro-esophageal reflux disease without esophagitis 11/24/2014   Accident due to mechanical fall without injury 08/03/2014   Essential hypertension 06/13/2014   Mixed hyperlipidemia 06/13/2014   Unspecified sequelae of cerebral infarction 06/13/2014   Thyromegaly 01/07/2014   Type 2 diabetes mellitus without complication 01/06/2014   Neck mass 09/08/2013   Swimmer's ear 09/08/2013   Erectile dysfunction 01/03/2013   Prediabetes 01/03/2013   ONSET DATE: 05/09/2016  REFERRING DIAG: I69.351 (ICD-10-CM) - Hemiplegia and hemiparesis following cerebral infarction affecting right dominant side   THERAPY DIAG:  Difficulty in walking, not elsewhere classified  Unsteadiness on feet  Muscle weakness (generalized)  Abnormality of gait and mobility  Left-sided weakness  Repeated falls  Other abnormalities of gait and mobility  Hemiplegia and hemiparesis following cerebral infarction affecting right dominant side (HCC)  Other lack of coordination   Rationale for Evaluation and Treatment Rehabilitation  SUBJECTIVE:  SUBJECTIVE STATEMENT  Patient reports trying to stay compliant with walking daily.    Pt accompanied by: aunt  PERTINENT HISTORY:  The pt is a pleasant 58 yo male who returns to PT for evaluation of deficits s/o old CVA. Pt with hx of CVA Feb 2018. He was previously fully independent, but CVA caused significant deficits, mainly affecting his right side. Pt reports continued difficulty with walking and transfers. Pt still using WC for majority of mobility, otherwise he uses his RW. Pt  lives with his mom who is his main caregiver. Patient had outpatient PT previously in clinic in 2023. He reports since discharge he has been completing his HEP and walking about every day. Pt would like to improve his walking mainly, but also would like to work on balance. He also reports difficulty with transfers. Pt's mom and his caregiver helps him with ADLs. However, he says he is now waiting for a new caregiver. Prior to this pt had caregiver 3x/week. PMH significant for hx of brain tumor, past CVA, DMII and HTN. See chart for additional information.  PAIN:  Are you having pain? Pt reports no pain currently  PRECAUTIONS: fall  WEIGHT BEARING RESTRICTIONS none  PLOF: required assistance with ADLs (old CVA), prior to CVA in 2018 was indep.  History of Falls: Pt reports no recent falls  Home information: Pt lives with mother who is his primary caregiver. Previously had home aide, 3x/week but reports currently waiting for new one.  PATIENT GOALS: Pt would like to improve his ability to ambulate.  OBJECTIVE:  taken at [facility] unless otherwise noted:   DIAGNOSTIC FINDINGS: No recent imaging per chart  Objective measures taken at eval unless otherwise specified:   Posture: forward head posture, rounded shoulders  MMT LE: grossly  4+/5, exception hip flexors and abductors 4/5 each bilat   Functional tests:  5xSTS:  34 sec, use of BUE (multiple initial attempts with posterior LOB in chair)   : 0.096 m/s with RW, close CGA, and WC follow  Gait for distance (goal 150 ft): deferred due to time  Gait mechanics: impaired, decreased speed (see ), crouched posture, poor postural stability and relies heavily on BUE support on RW. Pt now with bilat AFOS   FOTO: 44 (goal 50)   TODAY'S TREATMENT 01/06/24     TA- To improve functional movements patterns for everyday tasks   Sit to stand with 1UE on support bar and the other on armrest of manual w/c x 15 reps. No posterior lean  today.   Chair press ups- BUE on armrest (VC to lift bottom off on seat of chair) - slow and deliberate x 10.    Static standing at support bar focusing on erect posture and WB through BLE- holding onto green ball to keep from UE reaching for support x 3 min- Intermittent cues to hold trunk up and avoid excessive leaning forward.   Activity Description: Focusing on erect standing and dynamic UE/LE activity in standing- Either tapping with UE or LE onto correct pod. Activity Setting:  The Blaze Pod Random setting was chosen to enhance cognitive processing and agility, providing an unpredictable environment to simulate real-world scenarios, and fostering quick reactions and adaptability.   Number of Pods:  4 Cycles/Sets:  5 Duration (Time or Hit Count):  2 min Patient Stats  Reaction Time:  Hits:   6-10 for 8 trials  Standing holding ball overhead and tapping against dry erase board x 1 min- Holding posture without posterior  lean.   Dynamic UE activity in standing with UE reaching overhead and diagonal- placing magnet letters in prescribed order (color into shape written on board)  x several min      PATIENT EDUCATION: Education details: If pt can lean pelvis into support surface, his energy efficicency will be higher.  Person educated: Patient Education method: Explanation, Demonstration, Tactile cues, and Verbal cues Education comprehension: verbalized understanding, returned demonstration, and verbal cues required  HOME EXERCISE PROGRAM:  Standing hip flexor stretch at counter, prolonged standing with erect posture for gluteal activation, HS curls with RTB  Access Code: [email or web address] URL: [email or web address] Date: 05/15/2023 Prepared by: Connell Kiss  Exercises - Seated Long Arc Quad with Ankle Weight  - 1 x daily - 7 x weekly - 2 sets - 10 reps  GOALS: Goals reviewed with patient? No  SHORT TERM GOALS: Target date: 11/12/2022  Pt will be independent with HEP  in order to improve strength and balance in order to decrease fall risk and improve function at home. Baseline: instructed in HEP today, to provide printout next visit Goal status: MET  LONG TERM GOALS: Target date: 02/24/2024  Pt will increase FOTO to at least 50 in order to demonstrate improvement in function related to mobility and QoL. Baseline: 44 7/29:48 9/10:40 12/24/2022= 51 Goal status: MET  2.  Pt (< 60 yrs old) will complete 5xSTS test < 30 seconds indicating an increase in LE strength and improved balance. Baseline: 10/01/22 34 sec (multiple initial attempts with posterior LOB in chair) 12/20: 49 seconds 11/04/22:1:06, min A on multiple attempts to maintain balance and pt stays in flexed posture throughout test when coming to standing.   9/10: 27.5 sec, Pushes WC post consistently, close CGA  10/22: 47 seconds first attempt 12/5: 1 minute, 37 seconds, multiple attempts to get first stand, but no difficulty after that; 03/20/2023= 1 min 2 sec with 1UE on walker and 1 on armrest- some posterior lean- Close CGA; 06/17/2023= 38 sec with 1 UE on walker and 1 on armrest; 08/21/2023= 35 sec with 1UE on walker and 1 on armrest.  8/5: 36 sec with 1 UE on walker 1 on arm rest  Goal status: ONGOING  3.  Pt will increase to > 0.5 m/s (30 sec) as to improve gait speed for better home ambulation and reduce fall risk. Baseline: 10/01/22 0.096 m/s with RW, close CGA, and WC follow previously 12/27: 0.11 m/s 11/04/22: .056 m/s  9/10: 076 m/s 10/22: 0.64 m/s  11/26: .076 m/s; 03/20/2023= 0.08 m/s; 04/22/2023= 0.089 m/s; 05/15/2023 = 0.079 m/s; 05/27/2023= 0.11 m/s; 07/17/2023= 0.15 m/s; 08/21/2023= 0.14 m/s avg with Bariatric walker; 09/09/2023= 0.14 m/s; 10/02/2023= 0.09 m/s with Bariatric walker and Increased cues to swing RLE through vs. Previous sesisons. 11/13/2023= 0.12 m/s with Bariatric walker; 12/11/2023= 0.14 m/s Goal status: ONGOING  4.  Pt will increase hip flexor and abductor  gross strength to  4+/5 bilat as to improve functional strength for independent gait, increased standing tolerance, and increased ADL ability. Baseline: 10/01/22 4+/5, exception hip flexors and abductors 4/5 each bilat  (previously 03/27/22: 4+/5 ) 9/10: Hip flexor 4/5, hip abductor 4+/5 10/22: hip flexor 4/5 hip abductor 4+/5 11/26: hip flexor 4+/5 hip abductor 4+/5 Goal status: MET  5.  Pt will ambulate 150 ft with BRW and WC follow with no rest breaks in less than 12 min to allow for improved mobility within home and increased independence. Baseline: 10/03/22 150' with BRW 04/03/22: 105 ft with BRW  7/29: 100 in 9:30 with pediatric RW 9/10:101 ft with BRW stooped posture worsened throughout test 12/24/2022- 150 feet using BRW, CGA with w/c follow- in 10 min 17 sec. Will keep goal to make sure he can do this consistently 10/22: 119 ft with BRW 8 min 23 seconds terminated by patient  11/26: 148 ft with BRW 14 min 34 seconds terminated by patient 03/20/2023= 152 feet using BRW, CGA and w/c follow in 14 min 3 sec- Need to keep goal active to ensure consistency but will revised and add a time element to goal. 04/22/2023= 162 feet in 11 min 54 sec using bariatric RW, CGA and close CGA assist; 05/27/2023- 170 feet without rest break using bariatric RW. 06/17/2023= 160 feet with bariatric 4WW in 9 min- stopped due to fatigue.  Goal status: MET   6. Pt will decrease TUG to below 1 min 30 seconds/decrease in order to demonstrate decreased fall risk. Baseline: 06/17/2023= 2 min using bariatric 4WW; 09/09/2023= 1 min 50 sec with bariatric front wheeled walker. 12/02/2023= 1 min 44 sec  Goal status: PROGRESSING  7. Pt will ambulate 300 ft with BRW, CGA and WC follow with no rest breaks in less than 20 min to allow for improved mobility within home/community and increased independence. Baseline: 06/17/2023= 160 feet with bariatric 4WW in 9 min- stopped due to fatigue. 07/17/2023- Patient able to walk 270 feet in > 20 min with BRW, CGA  and close w/c follow today- VC for posture and for gait sequencing; 08/21/2023- Not specifically testing but patient has been walking 160 feet or more each Visit; 09/09/2023= Patient ambulated 180 feet overall at one time today using bariatric RW, CGA with VC for erect posture and for increased step length. 10/02/2023= 165 feet with Bariatric RW- close w/c follow and CGA. More diffiuclty with gait sequencing more consistent VC for erect posture. 12/02/2023- Paitent ambulated >100 feet in // bars today but will reassess using walker next visit. 12/11/2023= Patient ambulated 204 feet with BRW and Close CGA and w/c follow Goal Status: PROGRESSING  8. Patient will report ability to walk out his front door including stepping over threshold using his walker with min A of caregiver to enable him to be more independent with household mobility and be able to go out to porch with less dependence on caregivers. Baseline: Patient dependent on caregivers to push him in his wheelchair to go out to porch and reports dependent on them to always assist so he doesn't go outside like he would like to. Goal status: NEW  ASSESSMENT :  CLINICAL IMPRESSION:   Treatment focused on standing today with several activities designed to make patient stand as erect as possible to work his low back. He performed well overall but still needs constant reminders to hold his head and trunk up. He was fatigued with all activities today and stated exhausted at end of session but no pain.  Pt will continue to benefit from skilled physical therapy intervention to address impairments, improve QOL, and attain therapy goals.     OBJECTIVE IMPAIRMENTS Abnormal gait, decreased activity tolerance, decreased balance, decreased coordination, decreased endurance, decreased mobility, difficulty walking, decreased ROM, decreased strength, improper body mechanics, postural dysfunction, and obesity.   ACTIVITY LIMITATIONS carrying, lifting, bending, sitting,  standing, squatting, stairs, transfers, bed mobility, continence, bathing, toileting, dressing, hygiene/grooming, and locomotion level  PARTICIPATION LIMITATIONS: meal prep, cleaning, laundry, interpersonal relationship, driving, shopping, community activity, and yard work  PERSONAL FACTORS Fitness, Past/current experiences, Time since onset of  injury/illness/exacerbation, and 3+ comorbidities: hx of brain tumor, past CVA, HTN, obesity, learning disability, DMII are also affecting patient's functional outcome.   REHAB POTENTIAL: Fair    CLINICAL DECISION MAKING: Stable/uncomplicated  EVALUATION COMPLEXITY: Low  PLAN: PT FREQUENCY: 1-2x/week  PT DURATION: 12 weeks  PLANNED INTERVENTIONS: Therapeutic exercises, Therapeutic activity, Neuromuscular re-education, Balance training, Gait training, Patient/Family education, Joint mobilization, Stair training, Vestibular training, Orthotic/Fit training, DME instructions, Dry Needling, Electrical stimulation, Wheelchair mobility training, Cryotherapy, Moist heat, Compression bandaging, Manual therapy, and Re-evaluation  PLAN FOR NEXT SESSION:   -Bridging and review of some bed LE strengthening -Continue with functional strengthening exercises of the LE's  -Gait activities using RW. Incorporate proper technique with all transfers to decrease his risk of falling.   Reyes LOISE London PT  Physical Therapist- [facility]   2:03 PM 01/06/24

## 2024-01-08 ENCOUNTER — Ambulatory Visit: Attending: Internal Medicine

## 2024-01-08 DIAGNOSIS — R2689 Other abnormalities of gait and mobility: Secondary | ICD-10-CM | POA: Diagnosis present

## 2024-01-08 DIAGNOSIS — R278 Other lack of coordination: Secondary | ICD-10-CM | POA: Diagnosis present

## 2024-01-08 DIAGNOSIS — R531 Weakness: Secondary | ICD-10-CM | POA: Insufficient documentation

## 2024-01-08 DIAGNOSIS — R296 Repeated falls: Secondary | ICD-10-CM | POA: Insufficient documentation

## 2024-01-08 DIAGNOSIS — R269 Unspecified abnormalities of gait and mobility: Secondary | ICD-10-CM | POA: Diagnosis present

## 2024-01-08 DIAGNOSIS — R2681 Unsteadiness on feet: Secondary | ICD-10-CM | POA: Diagnosis present

## 2024-01-08 DIAGNOSIS — R262 Difficulty in walking, not elsewhere classified: Secondary | ICD-10-CM | POA: Insufficient documentation

## 2024-01-08 DIAGNOSIS — M6281 Muscle weakness (generalized): Secondary | ICD-10-CM | POA: Insufficient documentation

## 2024-01-08 DIAGNOSIS — I69351 Hemiplegia and hemiparesis following cerebral infarction affecting right dominant side: Secondary | ICD-10-CM | POA: Diagnosis present

## 2024-01-08 DIAGNOSIS — I639 Cerebral infarction, unspecified: Secondary | ICD-10-CM | POA: Diagnosis present

## 2024-01-08 NOTE — Therapy (Signed)
 OUTPATIENT PHYSICAL THERAPY TREATMENT   Patient Name: Curtis Powell MRN: [id number] DOB:12/11/1965, 58 y.o., male Today's Date: 01/08/2024  PCP: Sherial Bail, MD  REFERRING PROVIDER: Sherial Bail, MD    PT End of Session - 01/08/24 1328     Visit Number 114    Number of Visits 130    Date for Recertification  02/24/24    Progress Note Due on Visit 120    PT Start Time 1315    PT Stop Time 1356    PT Time Calculation (min) 41 min    Equipment Utilized During Treatment Gait belt    Activity Tolerance Patient tolerated treatment well    Behavior During Therapy WFL for tasks assessed/performed                Past Medical History:  Diagnosis Date   Asthma    GERD (gastroesophageal reflux disease)    Hyperlipidemia    Hypertension    Obesity    Stroke [facility])    Past Surgical History:  Procedure Laterality Date   BRAIN SURGERY     Patient Active Problem List   Diagnosis Date Noted   Impaired glucose tolerance 03/23/2020   Learning disability 03/23/2020   Urinary and fecal incontinence 06/14/2019   Chronic pain syndrome 03/23/2019   Constipation 03/23/2019   Dysarthria 03/23/2019   Dysphagia 03/23/2019   Learning difficulty 03/23/2019   Morbid obesity (HCC) 03/23/2019   Muscle weakness 03/23/2019   Vitamin D deficiency 03/23/2019   Bilateral carotid artery stenosis 12/15/2018   Moderate aortic valve stenosis 12/15/2018   BMI 45.0-49.9, adult (HCC) 11/10/2018   Cerebrovascular accident (HCC) 05/02/2016   Hemiparesis affecting right side as late effect of cerebrovascular accident (HCC) 05/01/2016   Brain tumor (HCC) 11/24/2014   Gastro-esophageal reflux disease without esophagitis 11/24/2014   Accident due to mechanical fall without injury 08/03/2014   Essential hypertension 06/13/2014   Mixed hyperlipidemia 06/13/2014   Unspecified sequelae of cerebral infarction 06/13/2014   Thyromegaly 01/07/2014   Type 2 diabetes mellitus without  complication 01/06/2014   Neck mass 09/08/2013   Swimmer's ear 09/08/2013   Erectile dysfunction 01/03/2013   Prediabetes 01/03/2013   ONSET DATE: 05/09/2016  REFERRING DIAG: I69.351 (ICD-10-CM) - Hemiplegia and hemiparesis following cerebral infarction affecting right dominant side   THERAPY DIAG:  Difficulty in walking, not elsewhere classified  Unsteadiness on feet  Muscle weakness (generalized)  Abnormality of gait and mobility  Left-sided weakness  Repeated falls  Other abnormalities of gait and mobility   Rationale for Evaluation and Treatment Rehabilitation  SUBJECTIVE:  SUBJECTIVE STATEMENT  Patient reports having a good week - states went out to eat for his birthday.   Pt accompanied by: aunt  PERTINENT HISTORY:  The pt is a pleasant 58 yo male who returns to PT for evaluation of deficits s/o old CVA. Pt with hx of CVA Feb 2018. He was previously fully independent, but CVA caused significant deficits, mainly affecting his right side. Pt reports continued difficulty with walking and transfers. Pt still using WC for majority of mobility, otherwise he uses his RW. Pt lives with his mom who is his main caregiver. Patient had outpatient PT previously in clinic in 2023. He reports since discharge he has been completing his HEP and walking about every day. Pt would like to improve his walking mainly, but also would like to work on balance. He also reports difficulty with transfers. Pt's mom and his caregiver helps him with ADLs. However, he says he is now waiting for a new caregiver. Prior to this pt had caregiver 3x/week. PMH significant for hx of brain tumor, past CVA, DMII and HTN. See chart for additional information.  PAIN:  Are you having pain? Pt reports no pain  currently  PRECAUTIONS: fall  WEIGHT BEARING RESTRICTIONS none  PLOF: required assistance with ADLs (old CVA), prior to CVA in 2018 was indep.  History of Falls: Pt reports no recent falls  Home information: Pt lives with mother who is his primary caregiver. Previously had home aide, 3x/week but reports currently waiting for new one.  PATIENT GOALS: Pt would like to improve his ability to ambulate.  OBJECTIVE:  taken at [facility] unless otherwise noted:   DIAGNOSTIC FINDINGS: No recent imaging per chart  Objective measures taken at eval unless otherwise specified:   Posture: forward head posture, rounded shoulders  MMT LE: grossly  4+/5, exception hip flexors and abductors 4/5 each bilat   Functional tests:  5xSTS:  34 sec, use of BUE (multiple initial attempts with posterior LOB in chair)   : 0.096 m/s with RW, close CGA, and WC follow  Gait for distance (goal 150 ft): deferred due to time  Gait mechanics: impaired, decreased speed (see ), crouched posture, poor postural stability and relies heavily on BUE support on RW. Pt now with bilat AFOS   FOTO: 44 (goal 50)   TODAY'S TREATMENT : 01/08/24    TA:   Sit to stand with 1 hand on armrest and the other on walker handle (on R) x 15 reps  Static stand x 2 min without UE support then dynamic UE reaching with only CGA x approx 20 reaches each UE- focusing on overall balance and on erect posture.    Gait: Ambulation with BRW, CGA with B AFO donned and close w/c follow -approx 150 feet today- Stopped due to fatigue.   -Swing phase practice for more efficient gait sequencing- standing in front of w/c using BRW - placing a cone in front of R LE as a goal to achieve with swing through x 20 reps x 2 sets        PATIENT EDUCATION: Education details: If pt can lean pelvis into support surface, his energy efficicency will be higher.  Person educated: Patient Education method: Explanation, Demonstration, Tactile  cues, and Verbal cues Education comprehension: verbalized understanding, returned demonstration, and verbal cues required  HOME EXERCISE PROGRAM:  Standing hip flexor stretch at counter, prolonged standing with erect posture for gluteal activation, HS curls with RTB  Access Code: [email or web address] URL: [email or web address] Date: 05/15/2023  Prepared by: Connell Kiss  Exercises - Seated Long Arc Quad with Ankle Weight  - 1 x daily - 7 x weekly - 2 sets - 10 reps  GOALS: Goals reviewed with patient? No  SHORT TERM GOALS: Target date: 11/12/2022  Pt will be independent with HEP in order to improve strength and balance in order to decrease fall risk and improve function at home. Baseline: instructed in HEP today, to provide printout next visit Goal status: MET  LONG TERM GOALS: Target date: 02/24/2024  Pt will increase FOTO to at least 50 in order to demonstrate improvement in function related to mobility and QoL. Baseline: 44 7/29:48 9/10:40 12/24/2022= 51 Goal status: MET  2.  Pt (< 60 yrs old) will complete 5xSTS test < 30 seconds indicating an increase in LE strength and improved balance. Baseline: 10/01/22 34 sec (multiple initial attempts with posterior LOB in chair) 12/20: 49 seconds 11/04/22:1:06, min A on multiple attempts to maintain balance and pt stays in flexed posture throughout test when coming to standing.   9/10: 27.5 sec, Pushes WC post consistently, close CGA  10/22: 47 seconds first attempt 12/5: 1 minute, 37 seconds, multiple attempts to get first stand, but no difficulty after that; 03/20/2023= 1 min 2 sec with 1UE on walker and 1 on armrest- some posterior lean- Close CGA; 06/17/2023= 38 sec with 1 UE on walker and 1 on armrest; 08/21/2023= 35 sec with 1UE on walker and 1 on armrest.  8/5: 36 sec with 1 UE on walker 1 on arm rest; 12/23/2023= 37.05 sec with some posterior LOB- CGA to min A to correct.  Goal status: ONGOING  3.  Pt will increase to > 0.5  m/s (30 sec) as to improve gait speed for better home ambulation and reduce fall risk. Baseline: 10/01/22 0.096 m/s with RW, close CGA, and WC follow previously 12/27: 0.11 m/s 11/04/22: .056 m/s  9/10: 076 m/s 10/22: 0.64 m/s  11/26: .076 m/s; 03/20/2023= 0.08 m/s; 04/22/2023= 0.089 m/s; 05/15/2023 = 0.079 m/s; 05/27/2023= 0.11 m/s; 07/17/2023= 0.15 m/s; 08/21/2023= 0.14 m/s avg with Bariatric walker; 09/09/2023= 0.14 m/s; 10/02/2023= 0.09 m/s with Bariatric walker and Increased cues to swing RLE through vs. Previous sesisons. 11/13/2023= 0.12 m/s with Bariatric walker; 12/11/2023= 0.14 m/s; 12/23/2023=0.135 m/s Goal status: ONGOING  4.  Pt will increase hip flexor and abductor  gross strength to 4+/5 bilat as to improve functional strength for independent gait, increased standing tolerance, and increased ADL ability. Baseline: 10/01/22 4+/5, exception hip flexors and abductors 4/5 each bilat  (previously 03/27/22: 4+/5 ) 9/10: Hip flexor 4/5, hip abductor 4+/5 10/22: hip flexor 4/5 hip abductor 4+/5 11/26: hip flexor 4+/5 hip abductor 4+/5 Goal status: MET  5.  Pt will ambulate 150 ft with BRW and WC follow with no rest breaks in less than 12 min to allow for improved mobility within home and increased independence. Baseline: 10/03/22 150' with BRW 04/03/22: 105 ft with BRW 7/29: 100 in 9:30 with pediatric RW 9/10:101 ft with BRW stooped posture worsened throughout test 12/24/2022- 150 feet using BRW, CGA with w/c follow- in 10 min 17 sec. Will keep goal to make sure he can do this consistently 10/22: 119 ft with BRW 8 min 23 seconds terminated by patient  11/26: 148 ft with BRW 14 min 34 seconds terminated by patient 03/20/2023= 152 feet using BRW, CGA and w/c follow in 14 min 3 sec- Need to keep goal active to ensure consistency but will revised and add  a time element to goal. 04/22/2023= 162 feet in 11 min 54 sec using bariatric RW, CGA and close CGA assist; 05/27/2023- 170 feet without rest break using bariatric  RW. 06/17/2023= 160 feet with bariatric 4WW in 9 min- stopped due to fatigue.  Goal status: MET   6. Pt will decrease TUG to below 1 min 30 seconds/decrease in order to demonstrate decreased fall risk. Baseline: 06/17/2023= 2 min using bariatric 4WW; 09/09/2023= 1 min 50 sec with bariatric front wheeled walker. 12/02/2023= 1 min 44 sec; 12/23/2023= 2 min 8 sec avg with BRW Goal status: ONGOING  7. Pt will ambulate 300 ft with BRW, CGA and WC follow with no rest breaks in less than 20 min to allow for improved mobility within home/community and increased independence. Baseline: 06/17/2023= 160 feet with bariatric 4WW in 9 min- stopped due to fatigue. 07/17/2023- Patient able to walk 270 feet in > 20 min with BRW, CGA and close w/c follow today- VC for posture and for gait sequencing; 08/21/2023- Not specifically testing but patient has been walking 160 feet or more each Visit; 09/09/2023= Patient ambulated 180 feet overall at one time today using bariatric RW, CGA with VC for erect posture and for increased step length. 10/02/2023= 165 feet with Bariatric RW- close w/c follow and CGA. More diffiuclty with gait sequencing more consistent VC for erect posture. 12/02/2023- Paitent ambulated >100 feet in // bars today but will reassess using walker next visit. 12/11/2023= Patient ambulated 204 feet with BRW and Close CGA and w/c follow Goal Status: PROGRESSING  8. Patient will report ability to walk out his front door including stepping over threshold using his walker with min A of caregiver to enable him to be more independent with household mobility and be able to go out to porch with less dependence on caregivers. Baseline: Patient dependent on caregivers to push him in his wheelchair to go out to porch and reports dependent on them to always assist so he doesn't go outside like he would like to. Goal status: NEW  ASSESSMENT :  CLINICAL IMPRESSION:   Patient exhibited increased difficulty with swinging RLE past LLE  today- more of a step to pattern with slower overall gait speed which corresponded to decrease overal gait distance. Patient with difficulty accepting VC and when asked upon resting why he couldn't take a longer step he replied I don't know my brain just couldn't get my let to do it. Later in session - focused specifically on swing phase with RLE and patient struggled yet able to kick the positioned cone to emphasize longer stride. Pt will continue to benefit from skilled physical therapy intervention to address impairments, improve QOL, and attain therapy goals.     OBJECTIVE IMPAIRMENTS Abnormal gait, decreased activity tolerance, decreased balance, decreased coordination, decreased endurance, decreased mobility, difficulty walking, decreased ROM, decreased strength, improper body mechanics, postural dysfunction, and obesity.   ACTIVITY LIMITATIONS carrying, lifting, bending, sitting, standing, squatting, stairs, transfers, bed mobility, continence, bathing, toileting, dressing, hygiene/grooming, and locomotion level  PARTICIPATION LIMITATIONS: meal prep, cleaning, laundry, interpersonal relationship, driving, shopping, community activity, and yard work  PERSONAL FACTORS Fitness, Past/current experiences, Time since onset of injury/illness/exacerbation, and 3+ comorbidities: hx of brain tumor, past CVA, HTN, obesity, learning disability, DMII are also affecting patient's functional outcome.   REHAB POTENTIAL: Fair    CLINICAL DECISION MAKING: Stable/uncomplicated  EVALUATION COMPLEXITY: Low  PLAN: PT FREQUENCY: 1-2x/week  PT DURATION: 12 weeks  PLANNED INTERVENTIONS: Therapeutic exercises, Therapeutic activity, Neuromuscular re-education, Balance  training, Gait training, Patient/Family education, Joint mobilization, Stair training, Vestibular training, Orthotic/Fit training, DME instructions, Dry Needling, Electrical stimulation, Wheelchair mobility training, Cryotherapy, Moist heat,  Compression bandaging, Manual therapy, and Re-evaluation  PLAN FOR NEXT SESSION:   -Bridging and review of some bed LE strengthening -Continue with functional strengthening exercises of the LE's  -Gait activities using RW. Incorporate proper technique with all transfers to decrease his risk of falling.   Reyes LOISE London PT  Physical Therapist- [place]  [facility]   2:36 PM 01/08/24    OUTPATIENT PHYSICAL THERAPY TREATMENT  Patient Name: Curtis Powell MRN: [id number] DOB:01-Mar-1966, 58 y.o., male Today's Date: 01/08/2024  PCP: Sherial Bail, MD  REFERRING PROVIDER: Sherial Bail, MD    PT End of Session - 01/08/24 1328     Visit Number 114    Number of Visits 130    Date for Recertification  02/24/24    Progress Note Due on Visit 120    PT Start Time 1315    PT Stop Time 1356    PT Time Calculation (min) 41 min    Equipment Utilized During Treatment Gait belt    Activity Tolerance Patient tolerated treatment well    Behavior During Therapy WFL for tasks assessed/performed                Past Medical History:  Diagnosis Date   Asthma    GERD (gastroesophageal reflux disease)    Hyperlipidemia    Hypertension    Obesity    Stroke [facility])    Past Surgical History:  Procedure Laterality Date   BRAIN SURGERY     Patient Active Problem List   Diagnosis Date Noted   Impaired glucose tolerance 03/23/2020   Learning disability 03/23/2020   Urinary and fecal incontinence 06/14/2019   Chronic pain syndrome 03/23/2019   Constipation 03/23/2019   Dysarthria 03/23/2019   Dysphagia 03/23/2019   Learning difficulty 03/23/2019   Morbid obesity (HCC) 03/23/2019   Muscle weakness 03/23/2019   Vitamin D deficiency 03/23/2019   Bilateral carotid artery stenosis 12/15/2018   Moderate aortic valve stenosis 12/15/2018   BMI 45.0-49.9, adult (HCC) 11/10/2018   Cerebrovascular accident (HCC) 05/02/2016   Hemiparesis affecting  right side as late effect of cerebrovascular accident (HCC) 05/01/2016   Brain tumor (HCC) 11/24/2014   Gastro-esophageal reflux disease without esophagitis 11/24/2014   Accident due to mechanical fall without injury 08/03/2014   Essential hypertension 06/13/2014   Mixed hyperlipidemia 06/13/2014   Unspecified sequelae of cerebral infarction 06/13/2014   Thyromegaly 01/07/2014   Type 2 diabetes mellitus without complication 01/06/2014   Neck mass 09/08/2013   Swimmer's ear 09/08/2013   Erectile dysfunction 01/03/2013   Prediabetes 01/03/2013   ONSET DATE: 05/09/2016  REFERRING DIAG: I69.351 (ICD-10-CM) - Hemiplegia and hemiparesis following cerebral infarction affecting right dominant side   THERAPY DIAG:  Difficulty in walking, not elsewhere classified  Unsteadiness on feet  Muscle weakness (generalized)  Abnormality of gait and mobility  Left-sided weakness  Repeated falls  Other abnormalities of gait and mobility   Rationale for Evaluation and Treatment Rehabilitation  SUBJECTIVE:  SUBJECTIVE STATEMENT  Patient reports doing well overall today- no aches or pain and no falls.   Pt accompanied by: aunt  PERTINENT HISTORY:  The pt is a pleasant 58 yo male who returns to PT for evaluation of deficits s/o old CVA. Pt with hx of CVA Feb 2018. He was previously fully independent, but CVA caused significant deficits, mainly affecting his right side. Pt reports continued difficulty with walking and transfers. Pt still using WC for majority of mobility, otherwise he uses his RW. Pt lives with his mom who is his main caregiver. Patient had outpatient PT previously in clinic in 2023. He reports since discharge he has been completing his HEP and walking about every day. Pt would like to improve his  walking mainly, but also would like to work on balance. He also reports difficulty with transfers. Pt's mom and his caregiver helps him with ADLs. However, he says he is now waiting for a new caregiver. Prior to this pt had caregiver 3x/week. PMH significant for hx of brain tumor, past CVA, DMII and HTN. See chart for additional information.  PAIN:  Are you having pain? Pt reports no pain currently  PRECAUTIONS: fall  WEIGHT BEARING RESTRICTIONS none  PLOF: required assistance with ADLs (old CVA), prior to CVA in 2018 was indep.  History of Falls: Pt reports no recent falls  Home information: Pt lives with mother who is his primary caregiver. Previously had home aide, 3x/week but reports currently waiting for new one.  PATIENT GOALS: Pt would like to improve his ability to ambulate.  OBJECTIVE:  taken at [facility] unless otherwise noted:   DIAGNOSTIC FINDINGS: No recent imaging per chart  Objective measures taken at eval unless otherwise specified:   Posture: forward head posture, rounded shoulders  MMT LE: grossly  4+/5, exception hip flexors and abductors 4/5 each bilat   Functional tests:  5xSTS:  34 sec, use of BUE (multiple initial attempts with posterior LOB in chair)   : 0.096 m/s with RW, close CGA, and WC follow  Gait for distance (goal 150 ft): deferred due to time  Gait mechanics: impaired, decreased speed (see ), crouched posture, poor postural stability and relies heavily on BUE support on RW. Pt now with bilat AFOS   FOTO: 44 (goal 50)   TODAY'S TREATMENT 01/08/24     TA- To improve functional movements patterns for everyday tasks   Nustep - interval training to promote LE strength/cardioresp endurance. PT sets up intervention, adjusts intensity throughout and monitors pt for response. Cuing for SPM/speed, pt maintains SPM in 60s - x 8 min - BUE/LE level 1-4  Sit to stand with 1UE on support bar and the other on armrest of manual w/c x 15 reps. No  posterior lean today.   Ambulation in clinic using BRW, CGA with use of gait belt, and close w/c follow- walking around rehab gym equipment- focusing on turning, looking up and negotiating around people and obstacles. Patient challenged to negotiate walker and focus on avoiding all obstacles- He ambulated approx 160 feet today. VC to look up at times to avoid equipment- He performed snake like pattern walking around leg press machine, 2 nusteps, TM, and Octane today.   NMR: Postural awareness -Seated scap row 3 x 10 reps with GTB  -Seated lumbar ext (resistive with GTB) 3 x 10 reps        PATIENT EDUCATION: Education details: If pt can lean pelvis into support surface, his energy efficicency will be higher.  Person educated: Patient Education method: Explanation, Demonstration, Tactile cues, and Verbal cues Education comprehension: verbalized understanding, returned demonstration, and verbal cues required  HOME EXERCISE PROGRAM:  Standing hip flexor stretch at counter, prolonged standing with erect posture for gluteal activation, HS curls with RTB  Access Code: [email or web address] URL: [email or web address] Date: 05/15/2023 Prepared by: Connell Kiss  Exercises - Seated Long Arc Quad with Ankle Weight  - 1 x daily - 7 x weekly - 2 sets - 10 reps  GOALS: Goals reviewed with patient? No  SHORT TERM GOALS: Target date: 11/12/2022  Pt will be independent with HEP in order to improve strength and balance in order to decrease fall risk and improve function at home. Baseline: instructed in HEP today, to provide printout next visit Goal status: MET  LONG TERM GOALS: Target date: 02/24/2024  Pt will increase FOTO to at least 50 in order to demonstrate improvement in function related to mobility and QoL. Baseline: 44 7/29:48 9/10:40 12/24/2022= 51 Goal status: MET  2.  Pt (< 60 yrs old) will complete 5xSTS test < 30 seconds indicating an increase in LE strength and improved  balance. Baseline: 10/01/22 34 sec (multiple initial attempts with posterior LOB in chair) 12/20: 49 seconds 11/04/22:1:06, min A on multiple attempts to maintain balance and pt stays in flexed posture throughout test when coming to standing.   9/10: 27.5 sec, Pushes WC post consistently, close CGA  10/22: 47 seconds first attempt 12/5: 1 minute, 37 seconds, multiple attempts to get first stand, but no difficulty after that; 03/20/2023= 1 min 2 sec with 1UE on walker and 1 on armrest- some posterior lean- Close CGA; 06/17/2023= 38 sec with 1 UE on walker and 1 on armrest; 08/21/2023= 35 sec with 1UE on walker and 1 on armrest.  8/5: 36 sec with 1 UE on walker 1 on arm rest  Goal status: ONGOING  3.  Pt will increase to > 0.5 m/s (30 sec) as to improve gait speed for better home ambulation and reduce fall risk. Baseline: 10/01/22 0.096 m/s with RW, close CGA, and WC follow previously 12/27: 0.11 m/s 11/04/22: .056 m/s  9/10: 076 m/s 10/22: 0.64 m/s  11/26: .076 m/s; 03/20/2023= 0.08 m/s; 04/22/2023= 0.089 m/s; 05/15/2023 = 0.079 m/s; 05/27/2023= 0.11 m/s; 07/17/2023= 0.15 m/s; 08/21/2023= 0.14 m/s avg with Bariatric walker; 09/09/2023= 0.14 m/s; 10/02/2023= 0.09 m/s with Bariatric walker and Increased cues to swing RLE through vs. Previous sesisons. 11/13/2023= 0.12 m/s with Bariatric walker; 12/11/2023= 0.14 m/s Goal status: ONGOING  4.  Pt will increase hip flexor and abductor  gross strength to 4+/5 bilat as to improve functional strength for independent gait, increased standing tolerance, and increased ADL ability. Baseline: 10/01/22 4+/5, exception hip flexors and abductors 4/5 each bilat  (previously 03/27/22: 4+/5 ) 9/10: Hip flexor 4/5, hip abductor 4+/5 10/22: hip flexor 4/5 hip abductor 4+/5 11/26: hip flexor 4+/5 hip abductor 4+/5 Goal status: MET  5.  Pt will ambulate 150 ft with BRW and WC follow with no rest breaks in less than 12 min to allow for improved mobility within home and increased  independence. Baseline: 10/03/22 150' with BRW 04/03/22: 105 ft with BRW 7/29: 100 in 9:30 with pediatric RW 9/10:101 ft with BRW stooped posture worsened throughout test 12/24/2022- 150 feet using BRW, CGA with w/c follow- in 10 min 17 sec. Will keep goal to make sure he can do this consistently 10/22: 119 ft with BRW 8 min 23 seconds terminated by patient  11/26: 148 ft with BRW 14 min 34 seconds terminated by patient 03/20/2023= 152 feet using BRW, CGA and w/c follow in 14 min 3 sec- Need to keep goal active to ensure consistency but will revised and add a time element to goal. 04/22/2023= 162 feet in 11 min 54 sec using bariatric RW, CGA and close CGA assist; 05/27/2023- 170 feet without rest break using bariatric RW. 06/17/2023= 160 feet with bariatric 4WW in 9 min- stopped due to fatigue.  Goal status: MET   6. Pt will decrease TUG to below 1 min 30 seconds/decrease in order to demonstrate decreased fall risk. Baseline: 06/17/2023= 2 min using bariatric 4WW; 09/09/2023= 1 min 50 sec with bariatric front wheeled walker. 12/02/2023= 1 min 44 sec  Goal status: PROGRESSING  7. Pt will ambulate 300 ft with BRW, CGA and WC follow with no rest breaks in less than 20 min to allow for improved mobility within home/community and increased independence. Baseline: 06/17/2023= 160 feet with bariatric 4WW in 9 min- stopped due to fatigue. 07/17/2023- Patient able to walk 270 feet in > 20 min with BRW, CGA and close w/c follow today- VC for posture and for gait sequencing; 08/21/2023- Not specifically testing but patient has been walking 160 feet or more each Visit; 09/09/2023= Patient ambulated 180 feet overall at one time today using bariatric RW, CGA with VC for erect posture and for increased step length. 10/02/2023= 165 feet with Bariatric RW- close w/c follow and CGA. More diffiuclty with gait sequencing more consistent VC for erect posture. 12/02/2023- Paitent ambulated >100 feet in // bars today but will reassess using  walker next visit. 12/11/2023= Patient ambulated 204 feet with BRW and Close CGA and w/c follow Goal Status: PROGRESSING  8. Patient will report ability to walk out his front door including stepping over threshold using his walker with min A of caregiver to enable him to be more independent with household mobility and be able to go out to porch with less dependence on caregivers. Baseline: Patient dependent on caregivers to push him in his wheelchair to go out to porch and reports dependent on them to always assist so he doesn't go outside like he would like to. Goal status: NEW  ASSESSMENT :  CLINICAL IMPRESSION:   Patient continues to demonstrate progress with gait ability- able to navigate around obstacles and equipment well rather than his normal of just walking straight ahead. He was fatigued at end of session after postural strengthening and reported some arm/back fatigue.  Pt will continue to benefit from skilled physical therapy intervention to address impairments, improve QOL, and attain therapy goals.     OBJECTIVE IMPAIRMENTS Abnormal gait, decreased activity tolerance, decreased balance, decreased coordination, decreased endurance, decreased mobility, difficulty walking, decreased ROM, decreased strength, improper body mechanics, postural dysfunction, and obesity.   ACTIVITY LIMITATIONS carrying, lifting, bending, sitting, standing, squatting, stairs, transfers, bed mobility, continence, bathing, toileting, dressing, hygiene/grooming, and locomotion level  PARTICIPATION LIMITATIONS: meal prep, cleaning, laundry, interpersonal relationship, driving, shopping, community activity, and yard work  PERSONAL FACTORS Fitness, Past/current experiences, Time since onset of injury/illness/exacerbation, and 3+ comorbidities: hx of brain tumor, past CVA, HTN, obesity, learning disability, DMII are also affecting patient's functional outcome.   REHAB POTENTIAL: Fair    CLINICAL DECISION MAKING:  Stable/uncomplicated  EVALUATION COMPLEXITY: Low  PLAN: PT FREQUENCY: 1-2x/week  PT DURATION: 12 weeks  PLANNED INTERVENTIONS: Therapeutic exercises, Therapeutic activity, Neuromuscular re-education, Balance training, Gait training, Patient/Family education, Joint mobilization, Stair training, Vestibular training, Orthotic/Fit  training, DME instructions, Dry Needling, Electrical stimulation, Wheelchair mobility training, Cryotherapy, Moist heat, Compression bandaging, Manual therapy, and Re-evaluation  PLAN FOR NEXT SESSION:   -Bridging and review of some bed LE strengthening -Continue with functional strengthening exercises of the LE's  -Gait activities using RW. Incorporate proper technique with all transfers to decrease his risk of falling.   Reyes LOISE London PT  Physical Therapist- [place]  [facility]   2:36 PM 01/08/24

## 2024-01-13 ENCOUNTER — Ambulatory Visit

## 2024-01-13 DIAGNOSIS — R2689 Other abnormalities of gait and mobility: Secondary | ICD-10-CM

## 2024-01-13 DIAGNOSIS — R262 Difficulty in walking, not elsewhere classified: Secondary | ICD-10-CM | POA: Diagnosis not present

## 2024-01-13 DIAGNOSIS — R296 Repeated falls: Secondary | ICD-10-CM

## 2024-01-13 DIAGNOSIS — M6281 Muscle weakness (generalized): Secondary | ICD-10-CM

## 2024-01-13 DIAGNOSIS — R2681 Unsteadiness on feet: Secondary | ICD-10-CM

## 2024-01-13 DIAGNOSIS — R269 Unspecified abnormalities of gait and mobility: Secondary | ICD-10-CM

## 2024-01-13 DIAGNOSIS — R531 Weakness: Secondary | ICD-10-CM

## 2024-01-13 NOTE — Therapy (Signed)
 OUTPATIENT PHYSICAL THERAPY TREATMENT   Patient Name: Curtis Powell MRN: [id number] DOB:08-18-1965, 58 y.o., male Today's Date: 01/13/2024  PCP: Sherial Bail, MD  REFERRING PROVIDER: Sherial Bail, MD    PT End of Session - 01/13/24 1329     Visit Number 115    Number of Visits 130    Date for Recertification  02/24/24    Progress Note Due on Visit 120    PT Start Time 1314    PT Stop Time 1358    PT Time Calculation (min) 44 min    Equipment Utilized During Treatment Gait belt    Activity Tolerance Patient tolerated treatment well    Behavior During Therapy WFL for tasks assessed/performed                Past Medical History:  Diagnosis Date   Asthma    GERD (gastroesophageal reflux disease)    Hyperlipidemia    Hypertension    Obesity    Stroke [facility])    Past Surgical History:  Procedure Laterality Date   BRAIN SURGERY     Patient Active Problem List   Diagnosis Date Noted   Impaired glucose tolerance 03/23/2020   Learning disability 03/23/2020   Urinary and fecal incontinence 06/14/2019   Chronic pain syndrome 03/23/2019   Constipation 03/23/2019   Dysarthria 03/23/2019   Dysphagia 03/23/2019   Learning difficulty 03/23/2019   Morbid obesity (HCC) 03/23/2019   Muscle weakness 03/23/2019   Vitamin D deficiency 03/23/2019   Bilateral carotid artery stenosis 12/15/2018   Moderate aortic valve stenosis 12/15/2018   BMI 45.0-49.9, adult (HCC) 11/10/2018   Cerebrovascular accident (HCC) 05/02/2016   Hemiparesis affecting right side as late effect of cerebrovascular accident (HCC) 05/01/2016   Brain tumor (HCC) 11/24/2014   Gastro-esophageal reflux disease without esophagitis 11/24/2014   Accident due to mechanical fall without injury 08/03/2014   Essential hypertension 06/13/2014   Mixed hyperlipidemia 06/13/2014   Unspecified sequelae of cerebral infarction 06/13/2014   Thyromegaly 01/07/2014   Type 2 diabetes mellitus without  complication 01/06/2014   Neck mass 09/08/2013   Swimmer's ear 09/08/2013   Erectile dysfunction 01/03/2013   Prediabetes 01/03/2013   ONSET DATE: 05/09/2016  REFERRING DIAG: I69.351 (ICD-10-CM) - Hemiplegia and hemiparesis following cerebral infarction affecting right dominant side   THERAPY DIAG:  Unsteadiness on feet  Difficulty in walking, not elsewhere classified  Muscle weakness (generalized)  Abnormality of gait and mobility  Left-sided weakness  Repeated falls  Other abnormalities of gait and mobility   Rationale for Evaluation and Treatment Rehabilitation  SUBJECTIVE:  SUBJECTIVE STATEMENT  Patient reports having a good week - states went out to eat for his birthday.   Pt accompanied by: aunt  PERTINENT HISTORY:  The pt is a pleasant 58 yo male who returns to PT for evaluation of deficits s/o old CVA. Pt with hx of CVA Feb 2018. He was previously fully independent, but CVA caused significant deficits, mainly affecting his right side. Pt reports continued difficulty with walking and transfers. Pt still using WC for majority of mobility, otherwise he uses his RW. Pt lives with his mom who is his main caregiver. Patient had outpatient PT previously in clinic in 2023. He reports since discharge he has been completing his HEP and walking about every day. Pt would like to improve his walking mainly, but also would like to work on balance. He also reports difficulty with transfers. Pt's mom and his caregiver helps him with ADLs. However, he says he is now waiting for a new caregiver. Prior to this pt had caregiver 3x/week. PMH significant for hx of brain tumor, past CVA, DMII and HTN. See chart for additional information.  PAIN:  Are you having pain? Pt reports no pain  currently  PRECAUTIONS: fall  WEIGHT BEARING RESTRICTIONS none  PLOF: required assistance with ADLs (old CVA), prior to CVA in 2018 was indep.  History of Falls: Pt reports no recent falls  Home information: Pt lives with mother who is his primary caregiver. Previously had home aide, 3x/week but reports currently waiting for new one.  PATIENT GOALS: Pt would like to improve his ability to ambulate.  OBJECTIVE:  taken at [facility] unless otherwise noted:   DIAGNOSTIC FINDINGS: No recent imaging per chart  Objective measures taken at eval unless otherwise specified:   Posture: forward head posture, rounded shoulders  MMT LE: grossly  4+/5, exception hip flexors and abductors 4/5 each bilat   Functional tests:  5xSTS:  34 sec, use of BUE (multiple initial attempts with posterior LOB in chair)   : 0.096 m/s with RW, close CGA, and WC follow  Gait for distance (goal 150 ft): deferred due to time  Gait mechanics: impaired, decreased speed (see ), crouched posture, poor postural stability and relies heavily on BUE support on RW. Pt now with bilat AFOS   FOTO: 44 (goal 50)   TODAY'S TREATMENT : 01/13/24    TA:   Sit to stand with 1 hand on armrest and the other on walker handle (on R) x 15 reps  Static stand x 2 min without UE support then dynamic UE reaching with only CGA x approx 20 reaches each UE- focusing on overall balance and on erect posture.    Gait: Ambulation with BRW, CGA with B AFO donned and close w/c follow -approx 150 feet today- Stopped due to fatigue.   -Swing phase practice for more efficient gait sequencing- standing in front of w/c using BRW - placing a cone in front of R LE as a goal to achieve with swing through x 20 reps x 2 sets        PATIENT EDUCATION: Education details: If pt can lean pelvis into support surface, his energy efficicency will be higher.  Person educated: Patient Education method: Explanation, Demonstration, Tactile  cues, and Verbal cues Education comprehension: verbalized understanding, returned demonstration, and verbal cues required  HOME EXERCISE PROGRAM:  Standing hip flexor stretch at counter, prolonged standing with erect posture for gluteal activation, HS curls with RTB  Access Code: [email or web address] URL: [email or web address] Date: 05/15/2023  Prepared by: Connell Kiss  Exercises - Seated Long Arc Quad with Ankle Weight  - 1 x daily - 7 x weekly - 2 sets - 10 reps  GOALS: Goals reviewed with patient? No  SHORT TERM GOALS: Target date: 11/12/2022  Pt will be independent with HEP in order to improve strength and balance in order to decrease fall risk and improve function at home. Baseline: instructed in HEP today, to provide printout next visit Goal status: MET  LONG TERM GOALS: Target date: 02/24/2024  Pt will increase FOTO to at least 50 in order to demonstrate improvement in function related to mobility and QoL. Baseline: 44 7/29:48 9/10:40 12/24/2022= 51 Goal status: MET  2.  Pt (< 60 yrs old) will complete 5xSTS test < 30 seconds indicating an increase in LE strength and improved balance. Baseline: 10/01/22 34 sec (multiple initial attempts with posterior LOB in chair) 12/20: 49 seconds 11/04/22:1:06, min A on multiple attempts to maintain balance and pt stays in flexed posture throughout test when coming to standing.   9/10: 27.5 sec, Pushes WC post consistently, close CGA  10/22: 47 seconds first attempt 12/5: 1 minute, 37 seconds, multiple attempts to get first stand, but no difficulty after that; 03/20/2023= 1 min 2 sec with 1UE on walker and 1 on armrest- some posterior lean- Close CGA; 06/17/2023= 38 sec with 1 UE on walker and 1 on armrest; 08/21/2023= 35 sec with 1UE on walker and 1 on armrest.  8/5: 36 sec with 1 UE on walker 1 on arm rest; 12/23/2023= 37.05 sec with some posterior LOB- CGA to min A to correct.  Goal status: ONGOING  3.  Pt will increase to > 0.5  m/s (30 sec) as to improve gait speed for better home ambulation and reduce fall risk. Baseline: 10/01/22 0.096 m/s with RW, close CGA, and WC follow previously 12/27: 0.11 m/s 11/04/22: .056 m/s  9/10: 076 m/s 10/22: 0.64 m/s  11/26: .076 m/s; 03/20/2023= 0.08 m/s; 04/22/2023= 0.089 m/s; 05/15/2023 = 0.079 m/s; 05/27/2023= 0.11 m/s; 07/17/2023= 0.15 m/s; 08/21/2023= 0.14 m/s avg with Bariatric walker; 09/09/2023= 0.14 m/s; 10/02/2023= 0.09 m/s with Bariatric walker and Increased cues to swing RLE through vs. Previous sesisons. 11/13/2023= 0.12 m/s with Bariatric walker; 12/11/2023= 0.14 m/s; 12/23/2023=0.135 m/s Goal status: ONGOING  4.  Pt will increase hip flexor and abductor  gross strength to 4+/5 bilat as to improve functional strength for independent gait, increased standing tolerance, and increased ADL ability. Baseline: 10/01/22 4+/5, exception hip flexors and abductors 4/5 each bilat  (previously 03/27/22: 4+/5 ) 9/10: Hip flexor 4/5, hip abductor 4+/5 10/22: hip flexor 4/5 hip abductor 4+/5 11/26: hip flexor 4+/5 hip abductor 4+/5 Goal status: MET  5.  Pt will ambulate 150 ft with BRW and WC follow with no rest breaks in less than 12 min to allow for improved mobility within home and increased independence. Baseline: 10/03/22 150' with BRW 04/03/22: 105 ft with BRW 7/29: 100 in 9:30 with pediatric RW 9/10:101 ft with BRW stooped posture worsened throughout test 12/24/2022- 150 feet using BRW, CGA with w/c follow- in 10 min 17 sec. Will keep goal to make sure he can do this consistently 10/22: 119 ft with BRW 8 min 23 seconds terminated by patient  11/26: 148 ft with BRW 14 min 34 seconds terminated by patient 03/20/2023= 152 feet using BRW, CGA and w/c follow in 14 min 3 sec- Need to keep goal active to ensure consistency but will revised and add  a time element to goal. 04/22/2023= 162 feet in 11 min 54 sec using bariatric RW, CGA and close CGA assist; 05/27/2023- 170 feet without rest break using bariatric  RW. 06/17/2023= 160 feet with bariatric 4WW in 9 min- stopped due to fatigue.  Goal status: MET   6. Pt will decrease TUG to below 1 min 30 seconds/decrease in order to demonstrate decreased fall risk. Baseline: 06/17/2023= 2 min using bariatric 4WW; 09/09/2023= 1 min 50 sec with bariatric front wheeled walker. 12/02/2023= 1 min 44 sec; 12/23/2023= 2 min 8 sec avg with BRW Goal status: ONGOING  7. Pt will ambulate 300 ft with BRW, CGA and WC follow with no rest breaks in less than 20 min to allow for improved mobility within home/community and increased independence. Baseline: 06/17/2023= 160 feet with bariatric 4WW in 9 min- stopped due to fatigue. 07/17/2023- Patient able to walk 270 feet in > 20 min with BRW, CGA and close w/c follow today- VC for posture and for gait sequencing; 08/21/2023- Not specifically testing but patient has been walking 160 feet or more each Visit; 09/09/2023= Patient ambulated 180 feet overall at one time today using bariatric RW, CGA with VC for erect posture and for increased step length. 10/02/2023= 165 feet with Bariatric RW- close w/c follow and CGA. More diffiuclty with gait sequencing more consistent VC for erect posture. 12/02/2023- Paitent ambulated >100 feet in // bars today but will reassess using walker next visit. 12/11/2023= Patient ambulated 204 feet with BRW and Close CGA and w/c follow Goal Status: PROGRESSING  8. Patient will report ability to walk out his front door including stepping over threshold using his walker with min A of caregiver to enable him to be more independent with household mobility and be able to go out to porch with less dependence on caregivers. Baseline: Patient dependent on caregivers to push him in his wheelchair to go out to porch and reports dependent on them to always assist so he doesn't go outside like he would like to. Goal status: NEW  ASSESSMENT :  CLINICAL IMPRESSION:   Patient exhibited increased difficulty with swinging RLE past LLE  today- more of a step to pattern with slower overall gait speed which corresponded to decrease overal gait distance. Patient with difficulty accepting VC and when asked upon resting why he couldn't take a longer step he replied I don't know my brain just couldn't get my let to do it. Later in session - focused specifically on swing phase with RLE and patient struggled yet able to kick the positioned cone to emphasize longer stride. Pt will continue to benefit from skilled physical therapy intervention to address impairments, improve QOL, and attain therapy goals.     OBJECTIVE IMPAIRMENTS Abnormal gait, decreased activity tolerance, decreased balance, decreased coordination, decreased endurance, decreased mobility, difficulty walking, decreased ROM, decreased strength, improper body mechanics, postural dysfunction, and obesity.   ACTIVITY LIMITATIONS carrying, lifting, bending, sitting, standing, squatting, stairs, transfers, bed mobility, continence, bathing, toileting, dressing, hygiene/grooming, and locomotion level  PARTICIPATION LIMITATIONS: meal prep, cleaning, laundry, interpersonal relationship, driving, shopping, community activity, and yard work  PERSONAL FACTORS Fitness, Past/current experiences, Time since onset of injury/illness/exacerbation, and 3+ comorbidities: hx of brain tumor, past CVA, HTN, obesity, learning disability, DMII are also affecting patient's functional outcome.   REHAB POTENTIAL: Fair    CLINICAL DECISION MAKING: Stable/uncomplicated  EVALUATION COMPLEXITY: Low  PLAN: PT FREQUENCY: 1-2x/week  PT DURATION: 12 weeks  PLANNED INTERVENTIONS: Therapeutic exercises, Therapeutic activity, Neuromuscular re-education, Balance  training, Gait training, Patient/Family education, Joint mobilization, Stair training, Vestibular training, Orthotic/Fit training, DME instructions, Dry Needling, Electrical stimulation, Wheelchair mobility training, Cryotherapy, Moist heat,  Compression bandaging, Manual therapy, and Re-evaluation  PLAN FOR NEXT SESSION:   -Bridging and review of some bed LE strengthening -Continue with functional strengthening exercises of the LE's  -Gait activities using RW. Incorporate proper technique with all transfers to decrease his risk of falling.   Reyes LOISE London PT  Physical Therapist- [place]  [facility]   2:10 PM 01/13/24    OUTPATIENT PHYSICAL THERAPY TREATMENT  Patient Name: Curtis Powell MRN: [id number] DOB:December 18, 1965, 58 y.o., male Today's Date: 01/13/2024  PCP: Sherial Bail, MD  REFERRING PROVIDER: Sherial Bail, MD    PT End of Session - 01/13/24 1329     Visit Number 115    Number of Visits 130    Date for Recertification  02/24/24    Progress Note Due on Visit 120    PT Start Time 1314    PT Stop Time 1358    PT Time Calculation (min) 44 min    Equipment Utilized During Treatment Gait belt    Activity Tolerance Patient tolerated treatment well    Behavior During Therapy WFL for tasks assessed/performed                Past Medical History:  Diagnosis Date   Asthma    GERD (gastroesophageal reflux disease)    Hyperlipidemia    Hypertension    Obesity    Stroke [facility])    Past Surgical History:  Procedure Laterality Date   BRAIN SURGERY     Patient Active Problem List   Diagnosis Date Noted   Impaired glucose tolerance 03/23/2020   Learning disability 03/23/2020   Urinary and fecal incontinence 06/14/2019   Chronic pain syndrome 03/23/2019   Constipation 03/23/2019   Dysarthria 03/23/2019   Dysphagia 03/23/2019   Learning difficulty 03/23/2019   Morbid obesity (HCC) 03/23/2019   Muscle weakness 03/23/2019   Vitamin D deficiency 03/23/2019   Bilateral carotid artery stenosis 12/15/2018   Moderate aortic valve stenosis 12/15/2018   BMI 45.0-49.9, adult (HCC) 11/10/2018   Cerebrovascular accident (HCC) 05/02/2016   Hemiparesis affecting  right side as late effect of cerebrovascular accident (HCC) 05/01/2016   Brain tumor (HCC) 11/24/2014   Gastro-esophageal reflux disease without esophagitis 11/24/2014   Accident due to mechanical fall without injury 08/03/2014   Essential hypertension 06/13/2014   Mixed hyperlipidemia 06/13/2014   Unspecified sequelae of cerebral infarction 06/13/2014   Thyromegaly 01/07/2014   Type 2 diabetes mellitus without complication 01/06/2014   Neck mass 09/08/2013   Swimmer's ear 09/08/2013   Erectile dysfunction 01/03/2013   Prediabetes 01/03/2013   ONSET DATE: 05/09/2016  REFERRING DIAG: I69.351 (ICD-10-CM) - Hemiplegia and hemiparesis following cerebral infarction affecting right dominant side   THERAPY DIAG:  Unsteadiness on feet  Difficulty in walking, not elsewhere classified  Muscle weakness (generalized)  Abnormality of gait and mobility  Left-sided weakness  Repeated falls  Other abnormalities of gait and mobility   Rationale for Evaluation and Treatment Rehabilitation  SUBJECTIVE:  SUBJECTIVE STATEMENT  Patient reports doing well overall today- no aches or pain and no falls.   Pt accompanied by: aunt  PERTINENT HISTORY:  The pt is a pleasant 58 yo male who returns to PT for evaluation of deficits s/o old CVA. Pt with hx of CVA Feb 2018. He was previously fully independent, but CVA caused significant deficits, mainly affecting his right side. Pt reports continued difficulty with walking and transfers. Pt still using WC for majority of mobility, otherwise he uses his RW. Pt lives with his mom who is his main caregiver. Patient had outpatient PT previously in clinic in 2023. He reports since discharge he has been completing his HEP and walking about every day. Pt would like to improve his  walking mainly, but also would like to work on balance. He also reports difficulty with transfers. Pt's mom and his caregiver helps him with ADLs. However, he says he is now waiting for a new caregiver. Prior to this pt had caregiver 3x/week. PMH significant for hx of brain tumor, past CVA, DMII and HTN. See chart for additional information.  PAIN:  Are you having pain? Pt reports no pain currently  PRECAUTIONS: fall  WEIGHT BEARING RESTRICTIONS none  PLOF: required assistance with ADLs (old CVA), prior to CVA in 2018 was indep.  History of Falls: Pt reports no recent falls  Home information: Pt lives with mother who is his primary caregiver. Previously had home aide, 3x/week but reports currently waiting for new one.  PATIENT GOALS: Pt would like to improve his ability to ambulate.  OBJECTIVE:  taken at [facility] unless otherwise noted:   DIAGNOSTIC FINDINGS: No recent imaging per chart  Objective measures taken at eval unless otherwise specified:   Posture: forward head posture, rounded shoulders  MMT LE: grossly  4+/5, exception hip flexors and abductors 4/5 each bilat   Functional tests:  5xSTS:  34 sec, use of BUE (multiple initial attempts with posterior LOB in chair)   : 0.096 m/s with RW, close CGA, and WC follow  Gait for distance (goal 150 ft): deferred due to time  Gait mechanics: impaired, decreased speed (see ), crouched posture, poor postural stability and relies heavily on BUE support on RW. Pt now with bilat AFOS   FOTO: 44 (goal 50)   TODAY'S TREATMENT 01/08/24     TA- To improve functional movements patterns for everyday tasks   Stepping in // bars over 1/2 foam roll to simulate threshold at front door at home then walk up to 6 step further along in // bars and step tap x 5 each LE onto 6 step with BUE - then retro walk back to start- performed 4 trials    NMR: Postural awareness -Seated scap row 3 x 10 reps with GTB    Activity Description:  At mirror wall - standing - 3 pods on floor approx 3in apart and 3 overhead Activity Setting:  The [employer] Pod Random setting was chosen to enhance cognitive processing and agility, providing an unpredictable environment to simulate real-world scenarios, and fostering quick reactions and adaptability.   Number of Pods:  6 Cycles/Sets:  6 Duration (Time or Hit Count):  2 min Patient Stats:  Patient with difficulty with overhead reaching with RUE- and difficulty with precision of step tap- sometimes able tap appropriately and sometimes more difficulty picking up LE.          PATIENT EDUCATION: Education details: If pt can lean pelvis into support surface, his energy efficicency  will be higher.  Person educated: Patient Education method: Explanation, Demonstration, Tactile cues, and Verbal cues Education comprehension: verbalized understanding, returned demonstration, and verbal cues required  HOME EXERCISE PROGRAM:  Standing hip flexor stretch at counter, prolonged standing with erect posture for gluteal activation, HS curls with RTB  Access Code: [email or web address] URL: [email or web address] Date: 05/15/2023 Prepared by: Connell Kiss  Exercises - Seated Long Arc Quad with Ankle Weight  - 1 x daily - 7 x weekly - 2 sets - 10 reps  GOALS: Goals reviewed with patient? No  SHORT TERM GOALS: Target date: 11/12/2022  Pt will be independent with HEP in order to improve strength and balance in order to decrease fall risk and improve function at home. Baseline: instructed in HEP today, to provide printout next visit Goal status: MET  LONG TERM GOALS: Target date: 02/24/2024  Pt will increase FOTO to at least 50 in order to demonstrate improvement in function related to mobility and QoL. Baseline: 44 7/29:48 9/10:40 12/24/2022= 51 Goal status: MET  2.  Pt (< 60 yrs old) will complete 5xSTS test < 30 seconds indicating an increase in LE strength and improved balance. Baseline:  10/01/22 34 sec (multiple initial attempts with posterior LOB in chair) 12/20: 49 seconds 11/04/22:1:06, min A on multiple attempts to maintain balance and pt stays in flexed posture throughout test when coming to standing.   9/10: 27.5 sec, Pushes WC post consistently, close CGA  10/22: 47 seconds first attempt 12/5: 1 minute, 37 seconds, multiple attempts to get first stand, but no difficulty after that; 03/20/2023= 1 min 2 sec with 1UE on walker and 1 on armrest- some posterior lean- Close CGA; 06/17/2023= 38 sec with 1 UE on walker and 1 on armrest; 08/21/2023= 35 sec with 1UE on walker and 1 on armrest.  8/5: 36 sec with 1 UE on walker 1 on arm rest  Goal status: ONGOING  3.  Pt will increase to > 0.5 m/s (30 sec) as to improve gait speed for better home ambulation and reduce fall risk. Baseline: 10/01/22 0.096 m/s with RW, close CGA, and WC follow previously 12/27: 0.11 m/s 11/04/22: .056 m/s  9/10: 076 m/s 10/22: 0.64 m/s  11/26: .076 m/s; 03/20/2023= 0.08 m/s; 04/22/2023= 0.089 m/s; 05/15/2023 = 0.079 m/s; 05/27/2023= 0.11 m/s; 07/17/2023= 0.15 m/s; 08/21/2023= 0.14 m/s avg with Bariatric walker; 09/09/2023= 0.14 m/s; 10/02/2023= 0.09 m/s with Bariatric walker and Increased cues to swing RLE through vs. Previous sesisons. 11/13/2023= 0.12 m/s with Bariatric walker; 12/11/2023= 0.14 m/s Goal status: ONGOING  4.  Pt will increase hip flexor and abductor  gross strength to 4+/5 bilat as to improve functional strength for independent gait, increased standing tolerance, and increased ADL ability. Baseline: 10/01/22 4+/5, exception hip flexors and abductors 4/5 each bilat  (previously 03/27/22: 4+/5 ) 9/10: Hip flexor 4/5, hip abductor 4+/5 10/22: hip flexor 4/5 hip abductor 4+/5 11/26: hip flexor 4+/5 hip abductor 4+/5 Goal status: MET  5.  Pt will ambulate 150 ft with BRW and WC follow with no rest breaks in less than 12 min to allow for improved mobility within home and increased independence. Baseline:  10/03/22 150' with BRW 04/03/22: 105 ft with BRW 7/29: 100 in 9:30 with pediatric RW 9/10:101 ft with BRW stooped posture worsened throughout test 12/24/2022- 150 feet using BRW, CGA with w/c follow- in 10 min 17 sec. Will keep goal to make sure he can do this consistently 10/22: 119 ft with BRW 8 min 23 seconds  terminated by patient  11/26: 148 ft with BRW 14 min 34 seconds terminated by patient 03/20/2023= 152 feet using BRW, CGA and w/c follow in 14 min 3 sec- Need to keep goal active to ensure consistency but will revised and add a time element to goal. 04/22/2023= 162 feet in 11 min 54 sec using bariatric RW, CGA and close CGA assist; 05/27/2023- 170 feet without rest break using bariatric RW. 06/17/2023= 160 feet with bariatric 4WW in 9 min- stopped due to fatigue.  Goal status: MET   6. Pt will decrease TUG to below 1 min 30 seconds/decrease in order to demonstrate decreased fall risk. Baseline: 06/17/2023= 2 min using bariatric 4WW; 09/09/2023= 1 min 50 sec with bariatric front wheeled walker. 12/02/2023= 1 min 44 sec  Goal status: PROGRESSING  7. Pt will ambulate 300 ft with BRW, CGA and WC follow with no rest breaks in less than 20 min to allow for improved mobility within home/community and increased independence. Baseline: 06/17/2023= 160 feet with bariatric 4WW in 9 min- stopped due to fatigue. 07/17/2023- Patient able to walk 270 feet in > 20 min with BRW, CGA and close w/c follow today- VC for posture and for gait sequencing; 08/21/2023- Not specifically testing but patient has been walking 160 feet or more each Visit; 09/09/2023= Patient ambulated 180 feet overall at one time today using bariatric RW, CGA with VC for erect posture and for increased step length. 10/02/2023= 165 feet with Bariatric RW- close w/c follow and CGA. More diffiuclty with gait sequencing more consistent VC for erect posture. 12/02/2023- Patient ambulated >100 feet in // bars today but will reassess using walker next visit.  12/11/2023= Patient ambulated 204 feet with BRW and Close CGA and w/c follow Goal Status: PROGRESSING  8. Patient will report ability to walk out his front door including stepping over threshold using his walker with min A of caregiver to enable him to be more independent with household mobility and be able to go out to porch with less dependence on caregivers. Baseline: Patient dependent on caregivers to push him in his wheelchair to go out to porch and reports dependent on them to always assist so he doesn't go outside like he would like to. Goal status: NEW  ASSESSMENT :  CLINICAL IMPRESSION:   Treatment focused again on functional mobility- progressing with ability to step over objects and patient with much improved overall ability to lift LE today. He responded well with blaze pod activity later- able to stand more erect and reach overhead- continued difficulty with limited reach of R UE.  Pt will continue to benefit from skilled physical therapy intervention to address impairments, improve QOL, and attain therapy goals.     OBJECTIVE IMPAIRMENTS Abnormal gait, decreased activity tolerance, decreased balance, decreased coordination, decreased endurance, decreased mobility, difficulty walking, decreased ROM, decreased strength, improper body mechanics, postural dysfunction, and obesity.   ACTIVITY LIMITATIONS carrying, lifting, bending, sitting, standing, squatting, stairs, transfers, bed mobility, continence, bathing, toileting, dressing, hygiene/grooming, and locomotion level  PARTICIPATION LIMITATIONS: meal prep, cleaning, laundry, interpersonal relationship, driving, shopping, community activity, and yard work  PERSONAL FACTORS Fitness, Past/current experiences, Time since onset of injury/illness/exacerbation, and 3+ comorbidities: hx of brain tumor, past CVA, HTN, obesity, learning disability, DMII are also affecting patient's functional outcome.   REHAB POTENTIAL: Fair    CLINICAL  DECISION MAKING: Stable/uncomplicated  EVALUATION COMPLEXITY: Low  PLAN: PT FREQUENCY: 1-2x/week  PT DURATION: 12 weeks  PLANNED INTERVENTIONS: Therapeutic exercises, Therapeutic activity, Neuromuscular re-education, Balance  training, Gait training, Patient/Family education, Joint mobilization, Stair training, Vestibular training, Orthotic/Fit training, DME instructions, Dry Needling, Electrical stimulation, Wheelchair mobility training, Cryotherapy, Moist heat, Compression bandaging, Manual therapy, and Re-evaluation  PLAN FOR NEXT SESSION:   -Bridging and review of some bed LE strengthening -Continue with functional strengthening exercises of the LE's  -Gait activities using RW. Incorporate proper technique with all transfers to decrease his risk of falling.   Reyes LOISE London PT  Physical Therapist- [facility]   2:10 PM 01/13/24

## 2024-01-15 ENCOUNTER — Ambulatory Visit

## 2024-01-15 DIAGNOSIS — R269 Unspecified abnormalities of gait and mobility: Secondary | ICD-10-CM

## 2024-01-15 DIAGNOSIS — R2689 Other abnormalities of gait and mobility: Secondary | ICD-10-CM

## 2024-01-15 DIAGNOSIS — R262 Difficulty in walking, not elsewhere classified: Secondary | ICD-10-CM

## 2024-01-15 DIAGNOSIS — R296 Repeated falls: Secondary | ICD-10-CM

## 2024-01-15 DIAGNOSIS — R2681 Unsteadiness on feet: Secondary | ICD-10-CM

## 2024-01-15 DIAGNOSIS — R531 Weakness: Secondary | ICD-10-CM

## 2024-01-15 DIAGNOSIS — R278 Other lack of coordination: Secondary | ICD-10-CM

## 2024-01-15 DIAGNOSIS — M6281 Muscle weakness (generalized): Secondary | ICD-10-CM

## 2024-01-15 DIAGNOSIS — I69351 Hemiplegia and hemiparesis following cerebral infarction affecting right dominant side: Secondary | ICD-10-CM

## 2024-01-15 NOTE — Therapy (Signed)
 OUTPATIENT PHYSICAL THERAPY TREATMENT   Patient Name: Curtis Powell MRN: [id number] DOB:Jul 29, 1965, 58 y.o., male Today's Date: 01/15/2024  PCP: Sherial Bail, MD  REFERRING PROVIDER: Sherial Bail, MD    PT End of Session - 01/15/24 1340     Visit Number 116    Number of Visits 130    Date for Recertification  02/24/24    Progress Note Due on Visit 120    PT Start Time 1315    Equipment Utilized During Treatment Gait belt    Activity Tolerance Patient tolerated treatment well    Behavior During Therapy [facility] for tasks assessed/performed                Past Medical History:  Diagnosis Date   Asthma    GERD (gastroesophageal reflux disease)    Hyperlipidemia    Hypertension    Obesity    Stroke [facility])    Past Surgical History:  Procedure Laterality Date   BRAIN SURGERY     Patient Active Problem List   Diagnosis Date Noted   Impaired glucose tolerance 03/23/2020   Learning disability 03/23/2020   Urinary and fecal incontinence 06/14/2019   Chronic pain syndrome 03/23/2019   Constipation 03/23/2019   Dysarthria 03/23/2019   Dysphagia 03/23/2019   Learning difficulty 03/23/2019   Morbid obesity (HCC) 03/23/2019   Muscle weakness 03/23/2019   Vitamin D deficiency 03/23/2019   Bilateral carotid artery stenosis 12/15/2018   Moderate aortic valve stenosis 12/15/2018   BMI 45.0-49.9, adult (HCC) 11/10/2018   Cerebrovascular accident (HCC) 05/02/2016   Hemiparesis affecting right side as late effect of cerebrovascular accident (HCC) 05/01/2016   Brain tumor (HCC) 11/24/2014   Gastro-esophageal reflux disease without esophagitis 11/24/2014   Accident due to mechanical fall without injury 08/03/2014   Essential hypertension 06/13/2014   Mixed hyperlipidemia 06/13/2014   Unspecified sequelae of cerebral infarction 06/13/2014   Thyromegaly 01/07/2014   Type 2 diabetes mellitus without complication 01/06/2014   Neck mass 09/08/2013   Swimmer's ear  09/08/2013   Erectile dysfunction 01/03/2013   Prediabetes 01/03/2013   ONSET DATE: 05/09/2016  REFERRING DIAG: I69.351 (ICD-10-CM) - Hemiplegia and hemiparesis following cerebral infarction affecting right dominant side   THERAPY DIAG:  Unsteadiness on feet  Difficulty in walking, not elsewhere classified  Muscle weakness (generalized)  Abnormality of gait and mobility  Left-sided weakness  Repeated falls  Other abnormalities of gait and mobility  Hemiplegia and hemiparesis following cerebral infarction affecting right dominant side (HCC)  Other lack of coordination   Rationale for Evaluation and Treatment Rehabilitation  SUBJECTIVE:  SUBJECTIVE STATEMENT  Patient reports hanging in there. Reports received his flu and Covid vaccination yesterday.   Pt accompanied by: aunt  PERTINENT HISTORY:  The pt is a pleasant 58 yo male who returns to PT for evaluation of deficits s/o old CVA. Pt with hx of CVA Feb 2018. He was previously fully independent, but CVA caused significant deficits, mainly affecting his right side. Pt reports continued difficulty with walking and transfers. Pt still using WC for majority of mobility, otherwise he uses his RW. Pt lives with his mom who is his main caregiver. Patient had outpatient PT previously in clinic in 2023. He reports since discharge he has been completing his HEP and walking about every day. Pt would like to improve his walking mainly, but also would like to work on balance. He also reports difficulty with transfers. Pt's mom and his caregiver helps him with ADLs. However, he says he is now waiting for a new caregiver. Prior to this pt had caregiver 3x/week. PMH significant for hx of brain tumor, past CVA, DMII and HTN. See chart for additional  information.  PAIN:  Are you having pain? Pt reports no pain currently  PRECAUTIONS: fall  WEIGHT BEARING RESTRICTIONS none  PLOF: required assistance with ADLs (old CVA), prior to CVA in 2018 was indep.  History of Falls: Pt reports no recent falls  Home information: Pt lives with mother who is his primary caregiver. Previously had home aide, 3x/week but reports currently waiting for new one.  PATIENT GOALS: Pt would like to improve his ability to ambulate.  OBJECTIVE:  taken at [facility] unless otherwise noted:   DIAGNOSTIC FINDINGS: No recent imaging per chart  Objective measures taken at eval unless otherwise specified:   Posture: forward head posture, rounded shoulders  MMT LE: grossly  4+/5, exception hip flexors and abductors 4/5 each bilat   Functional tests:  5xSTS:  34 sec, use of BUE (multiple initial attempts with posterior LOB in chair)   : 0.096 m/s with RW, close CGA, and WC follow  Gait for distance (goal 150 ft): deferred due to time  Gait mechanics: impaired, decreased speed (see ), crouched posture, poor postural stability and relies heavily on BUE support on RW. Pt now with bilat AFOS   FOTO: 44 (goal 50)   TODAY'S TREATMENT : 01/15/24    TA:   Sit to stand with 1 hand on armrest and the other on railing of bars ) x 15 reps  Static stand x 2 min without UE support   Gait: Ambulation in // bars, CGA with B AFO donned - step to gait technique- with very slow cadence and increased effort to advance each LE. Patient performed total of 4 rds of walking forward then bwd length of bars - when asked if he felt okay- he responded yes - not really sure why Im moving slow. He later informed that he received both flu and covid vaccinations yesterday and reports some right shoulder soreness.          PATIENT EDUCATION: Education details: If pt can lean pelvis into support surface, his energy efficicency will be higher.  Person educated:  Patient Education method: Explanation, Demonstration, Tactile cues, and Verbal cues Education comprehension: verbalized understanding, returned demonstration, and verbal cues required  HOME EXERCISE PROGRAM:  Standing hip flexor stretch at counter, prolonged standing with erect posture for gluteal activation, HS curls with RTB  Access Code: [email or web address] URL: [email or web address] Date: 05/15/2023 Prepared by: Connell Kiss  Exercises -  Seated Long Arc Quad with Ankle Weight  - 1 x daily - 7 x weekly - 2 sets - 10 reps  GOALS: Goals reviewed with patient? No  SHORT TERM GOALS: Target date: 11/12/2022  Pt will be independent with HEP in order to improve strength and balance in order to decrease fall risk and improve function at home. Baseline: instructed in HEP today, to provide printout next visit Goal status: MET  LONG TERM GOALS: Target date: 02/24/2024  Pt will increase FOTO to at least 50 in order to demonstrate improvement in function related to mobility and QoL. Baseline: 44 7/29:48 9/10:40 12/24/2022= 51 Goal status: MET  2.  Pt (< 60 yrs old) will complete 5xSTS test < 30 seconds indicating an increase in LE strength and improved balance. Baseline: 10/01/22 34 sec (multiple initial attempts with posterior LOB in chair) 12/20: 49 seconds 11/04/22:1:06, min A on multiple attempts to maintain balance and pt stays in flexed posture throughout test when coming to standing.   9/10: 27.5 sec, Pushes WC post consistently, close CGA  10/22: 47 seconds first attempt 12/5: 1 minute, 37 seconds, multiple attempts to get first stand, but no difficulty after that; 03/20/2023= 1 min 2 sec with 1UE on walker and 1 on armrest- some posterior lean- Close CGA; 06/17/2023= 38 sec with 1 UE on walker and 1 on armrest; 08/21/2023= 35 sec with 1UE on walker and 1 on armrest.  8/5: 36 sec with 1 UE on walker 1 on arm rest; 12/23/2023= 37.05 sec with some posterior LOB- CGA to min A to correct.   Goal status: ONGOING  3.  Pt will increase to > 0.5 m/s (30 sec) as to improve gait speed for better home ambulation and reduce fall risk. Baseline: 10/01/22 0.096 m/s with RW, close CGA, and WC follow previously 12/27: 0.11 m/s 11/04/22: .056 m/s  9/10: 076 m/s 10/22: 0.64 m/s  11/26: .076 m/s; 03/20/2023= 0.08 m/s; 04/22/2023= 0.089 m/s; 05/15/2023 = 0.079 m/s; 05/27/2023= 0.11 m/s; 07/17/2023= 0.15 m/s; 08/21/2023= 0.14 m/s avg with Bariatric walker; 09/09/2023= 0.14 m/s; 10/02/2023= 0.09 m/s with Bariatric walker and Increased cues to swing RLE through vs. Previous sesisons. 11/13/2023= 0.12 m/s with Bariatric walker; 12/11/2023= 0.14 m/s; 12/23/2023=0.135 m/s Goal status: ONGOING  4.  Pt will increase hip flexor and abductor  gross strength to 4+/5 bilat as to improve functional strength for independent gait, increased standing tolerance, and increased ADL ability. Baseline: 10/01/22 4+/5, exception hip flexors and abductors 4/5 each bilat  (previously 03/27/22: 4+/5 ) 9/10: Hip flexor 4/5, hip abductor 4+/5 10/22: hip flexor 4/5 hip abductor 4+/5 11/26: hip flexor 4+/5 hip abductor 4+/5 Goal status: MET  5.  Pt will ambulate 150 ft with BRW and WC follow with no rest breaks in less than 12 min to allow for improved mobility within home and increased independence. Baseline: 10/03/22 150' with BRW 04/03/22: 105 ft with BRW 7/29: 100 in 9:30 with pediatric RW 9/10:101 ft with BRW stooped posture worsened throughout test 12/24/2022- 150 feet using BRW, CGA with w/c follow- in 10 min 17 sec. Will keep goal to make sure he can do this consistently 10/22: 119 ft with BRW 8 min 23 seconds terminated by patient  11/26: 148 ft with BRW 14 min 34 seconds terminated by patient 03/20/2023= 152 feet using BRW, CGA and w/c follow in 14 min 3 sec- Need to keep goal active to ensure consistency but will revised and add a time element to goal. 04/22/2023= 162  feet in 11 min 54 sec using bariatric RW, CGA and close CGA  assist; 05/27/2023- 170 feet without rest break using bariatric RW. 06/17/2023= 160 feet with bariatric 4WW in 9 min- stopped due to fatigue.  Goal status: MET   6. Pt will decrease TUG to below 1 min 30 seconds/decrease in order to demonstrate decreased fall risk. Baseline: 06/17/2023= 2 min using bariatric 4WW; 09/09/2023= 1 min 50 sec with bariatric front wheeled walker. 12/02/2023= 1 min 44 sec; 12/23/2023= 2 min 8 sec avg with BRW Goal status: ONGOING  7. Pt will ambulate 300 ft with BRW, CGA and WC follow with no rest breaks in less than 20 min to allow for improved mobility within home/community and increased independence. Baseline: 06/17/2023= 160 feet with bariatric 4WW in 9 min- stopped due to fatigue. 07/17/2023- Patient able to walk 270 feet in > 20 min with BRW, CGA and close w/c follow today- VC for posture and for gait sequencing; 08/21/2023- Not specifically testing but patient has been walking 160 feet or more each Visit; 09/09/2023= Patient ambulated 180 feet overall at one time today using bariatric RW, CGA with VC for erect posture and for increased step length. 10/02/2023= 165 feet with Bariatric RW- close w/c follow and CGA. More diffiuclty with gait sequencing more consistent VC for erect posture. 12/02/2023- Paitent ambulated >100 feet in // bars today but will reassess using walker next visit. 12/11/2023= Patient ambulated 204 feet with BRW and Close CGA and w/c follow Goal Status: PROGRESSING  8. Patient will report ability to walk out his front door including stepping over threshold using his walker with min A of caregiver to enable him to be more independent with household mobility and be able to go out to porch with less dependence on caregivers. Baseline: Patient dependent on caregivers to push him in his wheelchair to go out to porch and reports dependent on them to always assist so he doesn't go outside like he would like to. Goal status: NEW  ASSESSMENT :  CLINICAL IMPRESSION:    Patient continued to exhibit more difficulty with gait quality- unable to advance LE well today and required about 3x as long as normal to walk forward and backward. Patient denied feeling bad but admitted to moving slow and reported receiving both flu and Covid vaccination yesterday.  Pt will continue to benefit from skilled physical therapy intervention to address impairments, improve QOL, and attain therapy goals.     OBJECTIVE IMPAIRMENTS Abnormal gait, decreased activity tolerance, decreased balance, decreased coordination, decreased endurance, decreased mobility, difficulty walking, decreased ROM, decreased strength, improper body mechanics, postural dysfunction, and obesity.   ACTIVITY LIMITATIONS carrying, lifting, bending, sitting, standing, squatting, stairs, transfers, bed mobility, continence, bathing, toileting, dressing, hygiene/grooming, and locomotion level  PARTICIPATION LIMITATIONS: meal prep, cleaning, laundry, interpersonal relationship, driving, shopping, community activity, and yard work  PERSONAL FACTORS Fitness, Past/current experiences, Time since onset of injury/illness/exacerbation, and 3+ comorbidities: hx of brain tumor, past CVA, HTN, obesity, learning disability, DMII are also affecting patient's functional outcome.   REHAB POTENTIAL: Fair    CLINICAL DECISION MAKING: Stable/uncomplicated  EVALUATION COMPLEXITY: Low  PLAN: PT FREQUENCY: 1-2x/week  PT DURATION: 12 weeks  PLANNED INTERVENTIONS: Therapeutic exercises, Therapeutic activity, Neuromuscular re-education, Balance training, Gait training, Patient/Family education, Joint mobilization, Stair training, Vestibular training, Orthotic/Fit training, DME instructions, Dry Needling, Electrical stimulation, Wheelchair mobility training, Cryotherapy, Moist heat, Compression bandaging, Manual therapy, and Re-evaluation  PLAN FOR NEXT SESSION:   -Bridging and review of some bed LE  strengthening -Continue with  functional strengthening exercises of the LE's  -Gait activities using RW. Incorporate proper technique with all transfers to decrease his risk of falling.   Reyes LOISE London PT  Physical Therapist- [place]  [facility]   2:19 PM 01/15/24    OUTPATIENT PHYSICAL THERAPY TREATMENT  Patient Name: Curtis Powell MRN: [id number] DOB:03-28-1966, 58 y.o., male Today's Date: 01/15/2024  PCP: Sherial Bail, MD  REFERRING PROVIDER: Sherial Bail, MD    PT End of Session - 01/15/24 1340     Visit Number 116    Number of Visits 130    Date for Recertification  02/24/24    Progress Note Due on Visit 120    PT Start Time 1315    Equipment Utilized During Treatment Gait belt    Activity Tolerance Patient tolerated treatment well    Behavior During Therapy [facility] for tasks assessed/performed                Past Medical History:  Diagnosis Date   Asthma    GERD (gastroesophageal reflux disease)    Hyperlipidemia    Hypertension    Obesity    Stroke [facility])    Past Surgical History:  Procedure Laterality Date   BRAIN SURGERY     Patient Active Problem List   Diagnosis Date Noted   Impaired glucose tolerance 03/23/2020   Learning disability 03/23/2020   Urinary and fecal incontinence 06/14/2019   Chronic pain syndrome 03/23/2019   Constipation 03/23/2019   Dysarthria 03/23/2019   Dysphagia 03/23/2019   Learning difficulty 03/23/2019   Morbid obesity (HCC) 03/23/2019   Muscle weakness 03/23/2019   Vitamin D deficiency 03/23/2019   Bilateral carotid artery stenosis 12/15/2018   Moderate aortic valve stenosis 12/15/2018   BMI 45.0-49.9, adult (HCC) 11/10/2018   Cerebrovascular accident (HCC) 05/02/2016   Hemiparesis affecting right side as late effect of cerebrovascular accident (HCC) 05/01/2016   Brain tumor (HCC) 11/24/2014   Gastro-esophageal reflux disease without esophagitis 11/24/2014   Accident due to mechanical fall without  injury 08/03/2014   Essential hypertension 06/13/2014   Mixed hyperlipidemia 06/13/2014   Unspecified sequelae of cerebral infarction 06/13/2014   Thyromegaly 01/07/2014   Type 2 diabetes mellitus without complication 01/06/2014   Neck mass 09/08/2013   Swimmer's ear 09/08/2013   Erectile dysfunction 01/03/2013   Prediabetes 01/03/2013   ONSET DATE: 05/09/2016  REFERRING DIAG: I69.351 (ICD-10-CM) - Hemiplegia and hemiparesis following cerebral infarction affecting right dominant side   THERAPY DIAG:  Unsteadiness on feet  Difficulty in walking, not elsewhere classified  Muscle weakness (generalized)  Abnormality of gait and mobility  Left-sided weakness  Repeated falls  Other abnormalities of gait and mobility  Hemiplegia and hemiparesis following cerebral infarction affecting right dominant side (HCC)  Other lack of coordination   Rationale for Evaluation and Treatment Rehabilitation  SUBJECTIVE:  SUBJECTIVE STATEMENT  Patient reports doing well overall today- no aches or pain and no falls.   Pt accompanied by: aunt  PERTINENT HISTORY:  The pt is a pleasant 58 yo male who returns to PT for evaluation of deficits s/o old CVA. Pt with hx of CVA Feb 2018. He was previously fully independent, but CVA caused significant deficits, mainly affecting his right side. Pt reports continued difficulty with walking and transfers. Pt still using WC for majority of mobility, otherwise he uses his RW. Pt lives with his mom who is his main caregiver. Patient had outpatient PT previously in clinic in 2023. He reports since discharge he has been completing his HEP and walking about every day. Pt would like to improve his walking mainly, but also would like to work on balance. He also reports difficulty with  transfers. Pt's mom and his caregiver helps him with ADLs. However, he says he is now waiting for a new caregiver. Prior to this pt had caregiver 3x/week. PMH significant for hx of brain tumor, past CVA, DMII and HTN. See chart for additional information.  PAIN:  Are you having pain? Pt reports no pain currently  PRECAUTIONS: fall  WEIGHT BEARING RESTRICTIONS none  PLOF: required assistance with ADLs (old CVA), prior to CVA in 2018 was indep.  History of Falls: Pt reports no recent falls  Home information: Pt lives with mother who is his primary caregiver. Previously had home aide, 3x/week but reports currently waiting for new one.  PATIENT GOALS: Pt would like to improve his ability to ambulate.  OBJECTIVE:  taken at [facility] unless otherwise noted:   DIAGNOSTIC FINDINGS: No recent imaging per chart  Objective measures taken at eval unless otherwise specified:   Posture: forward head posture, rounded shoulders  MMT LE: grossly  4+/5, exception hip flexors and abductors 4/5 each bilat   Functional tests:  5xSTS:  34 sec, use of BUE (multiple initial attempts with posterior LOB in chair)   : 0.096 m/s with RW, close CGA, and WC follow  Gait for distance (goal 150 ft): deferred due to time  Gait mechanics: impaired, decreased speed (see ), crouched posture, poor postural stability and relies heavily on BUE support on RW. Pt now with bilat AFOS   FOTO: 44 (goal 50)   TODAY'S TREATMENT 01/08/24     TA- To improve functional movements patterns for everyday tasks   Stepping in // bars over 1/2 foam roll to simulate threshold at front door at home then walk up to 6 step further along in // bars and step tap x 5 each LE onto 6 step with BUE - then retro walk back to start- performed 4 trials    NMR: Postural awareness -Seated scap row 3 x 10 reps with GTB    Activity Description: At mirror wall - standing - 3 pods on floor approx 3in apart and 3 overhead Activity  Setting:  The [employer] Random setting was chosen to enhance cognitive processing and agility, providing an unpredictable environment to simulate real-world scenarios, and fostering quick reactions and adaptability.   Number of Pods:  6 Cycles/Sets:  6 Duration (Time or Hit Count):  2 min Patient Stats:  Patient with difficulty with overhead reaching with RUE- and difficulty with precision of step tap- sometimes able tap appropriately and sometimes more difficulty picking up LE.          PATIENT EDUCATION: Education details: If pt can lean pelvis into support surface, his energy efficicency  will be higher.  Person educated: Patient Education method: Explanation, Demonstration, Tactile cues, and Verbal cues Education comprehension: verbalized understanding, returned demonstration, and verbal cues required  HOME EXERCISE PROGRAM:  Standing hip flexor stretch at counter, prolonged standing with erect posture for gluteal activation, HS curls with RTB  Access Code: [email or web address] URL: [email or web address] Date: 05/15/2023 Prepared by: Connell Kiss  Exercises - Seated Long Arc Quad with Ankle Weight  - 1 x daily - 7 x weekly - 2 sets - 10 reps  GOALS: Goals reviewed with patient? No  SHORT TERM GOALS: Target date: 11/12/2022  Pt will be independent with HEP in order to improve strength and balance in order to decrease fall risk and improve function at home. Baseline: instructed in HEP today, to provide printout next visit Goal status: MET  LONG TERM GOALS: Target date: 02/24/2024  Pt will increase FOTO to at least 50 in order to demonstrate improvement in function related to mobility and QoL. Baseline: 44 7/29:48 9/10:40 12/24/2022= 51 Goal status: MET  2.  Pt (< 60 yrs old) will complete 5xSTS test < 30 seconds indicating an increase in LE strength and improved balance. Baseline: 10/01/22 34 sec (multiple initial attempts with posterior LOB in chair) 12/20: 49 seconds  11/04/22:1:06, min A on multiple attempts to maintain balance and pt stays in flexed posture throughout test when coming to standing.   9/10: 27.5 sec, Pushes WC post consistently, close CGA  10/22: 47 seconds first attempt 12/5: 1 minute, 37 seconds, multiple attempts to get first stand, but no difficulty after that; 03/20/2023= 1 min 2 sec with 1UE on walker and 1 on armrest- some posterior lean- Close CGA; 06/17/2023= 38 sec with 1 UE on walker and 1 on armrest; 08/21/2023= 35 sec with 1UE on walker and 1 on armrest.  8/5: 36 sec with 1 UE on walker 1 on arm rest  Goal status: ONGOING  3.  Pt will increase to > 0.5 m/s (30 sec) as to improve gait speed for better home ambulation and reduce fall risk. Baseline: 10/01/22 0.096 m/s with RW, close CGA, and WC follow previously 12/27: 0.11 m/s 11/04/22: .056 m/s  9/10: 076 m/s 10/22: 0.64 m/s  11/26: .076 m/s; 03/20/2023= 0.08 m/s; 04/22/2023= 0.089 m/s; 05/15/2023 = 0.079 m/s; 05/27/2023= 0.11 m/s; 07/17/2023= 0.15 m/s; 08/21/2023= 0.14 m/s avg with Bariatric walker; 09/09/2023= 0.14 m/s; 10/02/2023= 0.09 m/s with Bariatric walker and Increased cues to swing RLE through vs. Previous sesisons. 11/13/2023= 0.12 m/s with Bariatric walker; 12/11/2023= 0.14 m/s Goal status: ONGOING  4.  Pt will increase hip flexor and abductor  gross strength to 4+/5 bilat as to improve functional strength for independent gait, increased standing tolerance, and increased ADL ability. Baseline: 10/01/22 4+/5, exception hip flexors and abductors 4/5 each bilat  (previously 03/27/22: 4+/5 ) 9/10: Hip flexor 4/5, hip abductor 4+/5 10/22: hip flexor 4/5 hip abductor 4+/5 11/26: hip flexor 4+/5 hip abductor 4+/5 Goal status: MET  5.  Pt will ambulate 150 ft with BRW and WC follow with no rest breaks in less than 12 min to allow for improved mobility within home and increased independence. Baseline: 10/03/22 150' with BRW 04/03/22: 105 ft with BRW 7/29: 100 in 9:30 with pediatric  RW 9/10:101 ft with BRW stooped posture worsened throughout test 12/24/2022- 150 feet using BRW, CGA with w/c follow- in 10 min 17 sec. Will keep goal to make sure he can do this consistently 10/22: 119 ft with BRW 8 min 23 seconds  terminated by patient  11/26: 148 ft with BRW 14 min 34 seconds terminated by patient 03/20/2023= 152 feet using BRW, CGA and w/c follow in 14 min 3 sec- Need to keep goal active to ensure consistency but will revised and add a time element to goal. 04/22/2023= 162 feet in 11 min 54 sec using bariatric RW, CGA and close CGA assist; 05/27/2023- 170 feet without rest break using bariatric RW. 06/17/2023= 160 feet with bariatric 4WW in 9 min- stopped due to fatigue.  Goal status: MET   6. Pt will decrease TUG to below 1 min 30 seconds/decrease in order to demonstrate decreased fall risk. Baseline: 06/17/2023= 2 min using bariatric 4WW; 09/09/2023= 1 min 50 sec with bariatric front wheeled walker. 12/02/2023= 1 min 44 sec  Goal status: PROGRESSING  7. Pt will ambulate 300 ft with BRW, CGA and WC follow with no rest breaks in less than 20 min to allow for improved mobility within home/community and increased independence. Baseline: 06/17/2023= 160 feet with bariatric 4WW in 9 min- stopped due to fatigue. 07/17/2023- Patient able to walk 270 feet in > 20 min with BRW, CGA and close w/c follow today- VC for posture and for gait sequencing; 08/21/2023- Not specifically testing but patient has been walking 160 feet or more each Visit; 09/09/2023= Patient ambulated 180 feet overall at one time today using bariatric RW, CGA with VC for erect posture and for increased step length. 10/02/2023= 165 feet with Bariatric RW- close w/c follow and CGA. More diffiuclty with gait sequencing more consistent VC for erect posture. 12/02/2023- Patient ambulated >100 feet in // bars today but will reassess using walker next visit. 12/11/2023= Patient ambulated 204 feet with BRW and Close CGA and w/c follow Goal  Status: PROGRESSING  8. Patient will report ability to walk out his front door including stepping over threshold using his walker with min A of caregiver to enable him to be more independent with household mobility and be able to go out to porch with less dependence on caregivers. Baseline: Patient dependent on caregivers to push him in his wheelchair to go out to porch and reports dependent on them to always assist so he doesn't go outside like he would like to. Goal status: NEW  ASSESSMENT :  CLINICAL IMPRESSION:   Treatment focused again on functional mobility- progressing with ability to step over objects and patient with much improved overall ability to lift LE today. He responded well with blaze pod activity later- able to stand more erect and reach overhead- continued difficulty with limited reach of R UE.  Pt will continue to benefit from skilled physical therapy intervention to address impairments, improve QOL, and attain therapy goals.     OBJECTIVE IMPAIRMENTS Abnormal gait, decreased activity tolerance, decreased balance, decreased coordination, decreased endurance, decreased mobility, difficulty walking, decreased ROM, decreased strength, improper body mechanics, postural dysfunction, and obesity.   ACTIVITY LIMITATIONS carrying, lifting, bending, sitting, standing, squatting, stairs, transfers, bed mobility, continence, bathing, toileting, dressing, hygiene/grooming, and locomotion level  PARTICIPATION LIMITATIONS: meal prep, cleaning, laundry, interpersonal relationship, driving, shopping, community activity, and yard work  PERSONAL FACTORS Fitness, Past/current experiences, Time since onset of injury/illness/exacerbation, and 3+ comorbidities: hx of brain tumor, past CVA, HTN, obesity, learning disability, DMII are also affecting patient's functional outcome.   REHAB POTENTIAL: Fair    CLINICAL DECISION MAKING: Stable/uncomplicated  EVALUATION COMPLEXITY: Low  PLAN: PT  FREQUENCY: 1-2x/week  PT DURATION: 12 weeks  PLANNED INTERVENTIONS: Therapeutic exercises, Therapeutic activity, Neuromuscular re-education, Balance  training, Gait training, Patient/Family education, Joint mobilization, Stair training, Vestibular training, Orthotic/Fit training, DME instructions, Dry Needling, Electrical stimulation, Wheelchair mobility training, Cryotherapy, Moist heat, Compression bandaging, Manual therapy, and Re-evaluation  PLAN FOR NEXT SESSION:   -Bridging and review of some bed LE strengthening -Continue with functional strengthening exercises of the LE's  -Gait activities using RW. Incorporate proper technique with all transfers to decrease his risk of falling.   Reyes LOISE London PT  Physical Therapist- [facility]   2:19 PM 01/15/24

## 2024-01-20 ENCOUNTER — Ambulatory Visit

## 2024-01-20 DIAGNOSIS — R278 Other lack of coordination: Secondary | ICD-10-CM

## 2024-01-20 DIAGNOSIS — R262 Difficulty in walking, not elsewhere classified: Secondary | ICD-10-CM

## 2024-01-20 DIAGNOSIS — I639 Cerebral infarction, unspecified: Secondary | ICD-10-CM

## 2024-01-20 DIAGNOSIS — R531 Weakness: Secondary | ICD-10-CM

## 2024-01-20 DIAGNOSIS — R269 Unspecified abnormalities of gait and mobility: Secondary | ICD-10-CM

## 2024-01-20 DIAGNOSIS — I69351 Hemiplegia and hemiparesis following cerebral infarction affecting right dominant side: Secondary | ICD-10-CM

## 2024-01-20 DIAGNOSIS — M6281 Muscle weakness (generalized): Secondary | ICD-10-CM

## 2024-01-20 DIAGNOSIS — R296 Repeated falls: Secondary | ICD-10-CM

## 2024-01-20 DIAGNOSIS — R2681 Unsteadiness on feet: Secondary | ICD-10-CM

## 2024-01-20 DIAGNOSIS — R2689 Other abnormalities of gait and mobility: Secondary | ICD-10-CM

## 2024-01-20 NOTE — Therapy (Signed)
 OUTPATIENT PHYSICAL THERAPY TREATMENT   Patient Name: Curtis Powell MRN: [id number] DOB:[birth year], 58 y.o., male Today's Date: 01/20/2024  PCP: Sherial Bail, MD  REFERRING PROVIDER: Sherial Bail, MD    PT End of Session - 01/20/24 1313     Visit Number [id number]    Number of Visits 130    Date for Recertification  02/24/24    Progress Note Due on Visit 120    PT Start Time 1315    PT Stop Time 1400    PT Time Calculation (min) 45 min    Equipment Utilized During Treatment Gait belt    Activity Tolerance Patient tolerated treatment well    Behavior During Therapy WFL for tasks assessed/performed                Past Medical History:  Diagnosis Date   Asthma    GERD (gastroesophageal reflux disease)    Hyperlipidemia    Hypertension    Obesity    Stroke [facility])    Past Surgical History:  Procedure Laterality Date   BRAIN SURGERY     Patient Active Problem List   Diagnosis Date Noted   Impaired glucose tolerance 03/23/2020   Learning disability 03/23/2020   Urinary and fecal incontinence 06/14/2019   Chronic pain syndrome 03/23/2019   Constipation 03/23/2019   Dysarthria 03/23/2019   Dysphagia 03/23/2019   Learning difficulty 03/23/2019   Morbid obesity (HCC) 03/23/2019   Muscle weakness 03/23/2019   Vitamin D deficiency 03/23/2019   Bilateral carotid artery stenosis 12/15/2018   Moderate aortic valve stenosis 12/15/2018   BMI 45.0-49.9, adult (HCC) 11/10/2018   Cerebrovascular accident (HCC) 05/02/2016   Hemiparesis affecting right side as late effect of cerebrovascular accident (HCC) 05/01/2016   Brain tumor (HCC) 11/24/2014   Gastro-esophageal reflux disease without esophagitis 11/24/2014   Accident due to mechanical fall without injury 08/03/2014   Essential hypertension 06/13/2014   Mixed hyperlipidemia 06/13/2014   Unspecified sequelae of cerebral infarction 06/13/2014   Thyromegaly 01/07/2014   Type 2 diabetes mellitus without  complication 01/06/2014   Neck mass 09/08/2013   Swimmer's ear 09/08/2013   Erectile dysfunction 01/03/2013   Prediabetes 01/03/2013   ONSET DATE: 05/09/2016  REFERRING DIAG: I69.351 (ICD-10-CM) - Hemiplegia and hemiparesis following cerebral infarction affecting right dominant side   THERAPY DIAG:  Unsteadiness on feet  Difficulty in walking, not elsewhere classified  Muscle weakness (generalized)  Abnormality of gait and mobility  Left-sided weakness  Repeated falls  Other abnormalities of gait and mobility  Hemiplegia and hemiparesis following cerebral infarction affecting right dominant side (HCC)  Other lack of coordination  Acute CVA (cerebrovascular accident) (HCC)   Rationale for Evaluation and Treatment Rehabilitation  SUBJECTIVE:  SUBJECTIVE STATEMENT  Patient reports hanging in there. Reports received his flu and Covid vaccination yesterday.   Pt accompanied by: aunt  PERTINENT HISTORY:  The pt is a pleasant 58 yo male who returns to PT for evaluation of deficits s/o old CVA. Pt with hx of CVA Feb 2018. He was previously fully independent, but CVA caused significant deficits, mainly affecting his right side. Pt reports continued difficulty with walking and transfers. Pt still using WC for majority of mobility, otherwise he uses his RW. Pt lives with his mom who is his main caregiver. Patient had outpatient PT previously in clinic in 2023. He reports since discharge he has been completing his HEP and walking about every day. Pt would like to improve his walking mainly, but also would like to work on balance. He also reports difficulty with transfers. Pt's mom and his caregiver helps him with ADLs. However, he says he is now waiting for a new caregiver. Prior to this pt had  caregiver 3x/week. PMH significant for hx of brain tumor, past CVA, DMII and HTN. See chart for additional information.  PAIN:  Are you having pain? Pt reports no pain currently  PRECAUTIONS: fall  WEIGHT BEARING RESTRICTIONS none  PLOF: required assistance with ADLs (old CVA), prior to CVA in 2018 was indep.  History of Falls: Pt reports no recent falls  Home information: Pt lives with mother who is his primary caregiver. Previously had home aide, 3x/week but reports currently waiting for new one.  PATIENT GOALS: Pt would like to improve his ability to ambulate.  OBJECTIVE:  taken at [facility] unless otherwise noted:   DIAGNOSTIC FINDINGS: No recent imaging per chart  Objective measures taken at eval unless otherwise specified:   Posture: forward head posture, rounded shoulders  MMT LE: grossly  4+/5, exception hip flexors and abductors 4/5 each bilat   Functional tests:  5xSTS:  34 sec, use of BUE (multiple initial attempts with posterior LOB in chair)   : 0.096 m/s with RW, close CGA, and WC follow  Gait for distance (goal 150 ft): deferred due to time  Gait mechanics: impaired, decreased speed (see ), crouched posture, poor postural stability and relies heavily on BUE support on RW. Pt now with bilat AFOS   FOTO: 44 (goal 50)   TODAY'S TREATMENT : 01/20/24    TA:   Sit to stand with 1 hand on armrest and the other on railing of bars ) x 15 reps  Static stand x 2 min without UE support   Gait: Ambulation in // bars, CGA with B AFO donned - step to gait technique- with very slow cadence and increased effort to advance each LE. Patient performed total of 4 rds of walking forward then bwd length of bars - when asked if he felt okay- he responded yes - not really sure why Im moving slow. He later informed that he received both flu and covid vaccinations yesterday and reports some right shoulder soreness.          PATIENT EDUCATION: Education details:  If pt can lean pelvis into support surface, his energy efficicency will be higher.  Person educated: Patient Education method: Explanation, Demonstration, Tactile cues, and Verbal cues Education comprehension: verbalized understanding, returned demonstration, and verbal cues required  HOME EXERCISE PROGRAM:  Standing hip flexor stretch at counter, prolonged standing with erect posture for gluteal activation, HS curls with RTB  Access Code: [email or web address] URL: [email or web address] Date: 05/15/2023 Prepared by: Connell Kiss  Exercises -  Seated Long Arc Quad with Ankle Weight  - 1 x daily - 7 x weekly - 2 sets - 10 reps  GOALS: Goals reviewed with patient? No  SHORT TERM GOALS: Target date: 11/12/2022  Pt will be independent with HEP in order to improve strength and balance in order to decrease fall risk and improve function at home. Baseline: instructed in HEP today, to provide printout next visit Goal status: MET  LONG TERM GOALS: Target date: 02/24/2024  Pt will increase FOTO to at least 50 in order to demonstrate improvement in function related to mobility and QoL. Baseline: 44 7/29:48 9/10:40 12/24/2022= 51 Goal status: MET  2.  Pt (< 60 yrs old) will complete 5xSTS test < 30 seconds indicating an increase in LE strength and improved balance. Baseline: 10/01/22 34 sec (multiple initial attempts with posterior LOB in chair) 12/20: 49 seconds 11/04/22:1:06, min A on multiple attempts to maintain balance and pt stays in flexed posture throughout test when coming to standing.   9/10: 27.5 sec, Pushes WC post consistently, close CGA  10/22: 47 seconds first attempt 12/5: 1 minute, 37 seconds, multiple attempts to get first stand, but no difficulty after that; 03/20/2023= 1 min 2 sec with 1UE on walker and 1 on armrest- some posterior lean- Close CGA; 06/17/2023= 38 sec with 1 UE on walker and 1 on armrest; 08/21/2023= 35 sec with 1UE on walker and 1 on armrest.  8/5: 36 sec with  1 UE on walker 1 on arm rest; 12/23/2023= 37.05 sec with some posterior LOB- CGA to min A to correct.  Goal status: ONGOING  3.  Pt will increase to > 0.5 m/s (30 sec) as to improve gait speed for better home ambulation and reduce fall risk. Baseline: 10/01/22 0.096 m/s with RW, close CGA, and WC follow previously 12/27: 0.11 m/s 11/04/22: .056 m/s  9/10: 076 m/s 10/22: 0.64 m/s  11/26: .076 m/s; 03/20/2023= 0.08 m/s; 04/22/2023= 0.089 m/s; 05/15/2023 = 0.079 m/s; 05/27/2023= 0.11 m/s; 07/17/2023= 0.15 m/s; 08/21/2023= 0.14 m/s avg with Bariatric walker; 09/09/2023= 0.14 m/s; 10/02/2023= 0.09 m/s with Bariatric walker and Increased cues to swing RLE through vs. Previous sesisons. 11/13/2023= 0.12 m/s with Bariatric walker; 12/11/2023= 0.14 m/s; 12/23/2023=0.135 m/s Goal status: ONGOING  4.  Pt will increase hip flexor and abductor  gross strength to 4+/5 bilat as to improve functional strength for independent gait, increased standing tolerance, and increased ADL ability. Baseline: 10/01/22 4+/5, exception hip flexors and abductors 4/5 each bilat  (previously 03/27/22: 4+/5 ) 9/10: Hip flexor 4/5, hip abductor 4+/5 10/22: hip flexor 4/5 hip abductor 4+/5 11/26: hip flexor 4+/5 hip abductor 4+/5 Goal status: MET  5.  Pt will ambulate 150 ft with BRW and WC follow with no rest breaks in less than 12 min to allow for improved mobility within home and increased independence. Baseline: 10/03/22 150' with BRW 04/03/22: 105 ft with BRW 7/29: 100 in 9:30 with pediatric RW 9/10:101 ft with BRW stooped posture worsened throughout test 12/24/2022- 150 feet using BRW, CGA with w/c follow- in 10 min 17 sec. Will keep goal to make sure he can do this consistently 10/22: 119 ft with BRW 8 min 23 seconds terminated by patient  11/26: 148 ft with BRW 14 min 34 seconds terminated by patient 03/20/2023= 152 feet using BRW, CGA and w/c follow in 14 min 3 sec- Need to keep goal active to ensure consistency but will revised and  add a time element to goal. 04/22/2023=  162 feet in 11 min 54 sec using bariatric RW, CGA and close CGA assist; 05/27/2023- 170 feet without rest break using bariatric RW. 06/17/2023= 160 feet with bariatric 4WW in 9 min- stopped due to fatigue.  Goal status: MET   6. Pt will decrease TUG to below 1 min 30 seconds/decrease in order to demonstrate decreased fall risk. Baseline: 06/17/2023= 2 min using bariatric 4WW; 09/09/2023= 1 min 50 sec with bariatric front wheeled walker. 12/02/2023= 1 min 44 sec; 12/23/2023= 2 min 8 sec avg with BRW Goal status: ONGOING  7. Pt will ambulate 300 ft with BRW, CGA and WC follow with no rest breaks in less than 20 min to allow for improved mobility within home/community and increased independence. Baseline: 06/17/2023= 160 feet with bariatric 4WW in 9 min- stopped due to fatigue. 07/17/2023- Patient able to walk 270 feet in > 20 min with BRW, CGA and close w/c follow today- VC for posture and for gait sequencing; 08/21/2023- Not specifically testing but patient has been walking 160 feet or more each Visit; 09/09/2023= Patient ambulated 180 feet overall at one time today using bariatric RW, CGA with VC for erect posture and for increased step length. 10/02/2023= 165 feet with Bariatric RW- close w/c follow and CGA. More diffiuclty with gait sequencing more consistent VC for erect posture. 12/02/2023- Paitent ambulated >100 feet in // bars today but will reassess using walker next visit. 12/11/2023= Patient ambulated 204 feet with BRW and Close CGA and w/c follow Goal Status: PROGRESSING  8. Patient will report ability to walk out his front door including stepping over threshold using his walker with min A of caregiver to enable him to be more independent with household mobility and be able to go out to porch with less dependence on caregivers. Baseline: Patient dependent on caregivers to push him in his wheelchair to go out to porch and reports dependent on them to always assist so he  doesn't go outside like he would like to. Goal status: NEW  ASSESSMENT :  CLINICAL IMPRESSION:   Patient continued to exhibit more difficulty with gait quality- unable to advance LE well today and required about 3x as long as normal to walk forward and backward. Patient denied feeling bad but admitted to moving slow and reported receiving both flu and Covid vaccination yesterday.  Pt will continue to benefit from skilled physical therapy intervention to address impairments, improve QOL, and attain therapy goals.     OBJECTIVE IMPAIRMENTS Abnormal gait, decreased activity tolerance, decreased balance, decreased coordination, decreased endurance, decreased mobility, difficulty walking, decreased ROM, decreased strength, improper body mechanics, postural dysfunction, and obesity.   ACTIVITY LIMITATIONS carrying, lifting, bending, sitting, standing, squatting, stairs, transfers, bed mobility, continence, bathing, toileting, dressing, hygiene/grooming, and locomotion level  PARTICIPATION LIMITATIONS: meal prep, cleaning, laundry, interpersonal relationship, driving, shopping, community activity, and yard work  PERSONAL FACTORS Fitness, Past/current experiences, Time since onset of injury/illness/exacerbation, and 3+ comorbidities: hx of brain tumor, past CVA, HTN, obesity, learning disability, DMII are also affecting patient's functional outcome.   REHAB POTENTIAL: Fair    CLINICAL DECISION MAKING: Stable/uncomplicated  EVALUATION COMPLEXITY: Low  PLAN: PT FREQUENCY: 1-2x/week  PT DURATION: 12 weeks  PLANNED INTERVENTIONS: Therapeutic exercises, Therapeutic activity, Neuromuscular re-education, Balance training, Gait training, Patient/Family education, Joint mobilization, Stair training, Vestibular training, Orthotic/Fit training, DME instructions, Dry Needling, Electrical stimulation, Wheelchair mobility training, Cryotherapy, Moist heat, Compression bandaging, Manual therapy, and  Re-evaluation  PLAN FOR NEXT SESSION:   -Bridging and review of some bed  LE strengthening -Continue with functional strengthening exercises of the LE's  -Gait activities using RW. Incorporate proper technique with all transfers to decrease his risk of falling.   Fonda LELON Simpers PT  Physical Therapist- [place]  [facility]   3:37 PM 01/20/24    OUTPATIENT PHYSICAL THERAPY TREATMENT  Patient Name: Curtis Powell MRN: [id number] DOB:Dec 29, 1965, 58 y.o., male Today's Date: 01/20/2024  PCP: Sherial Bail, MD  REFERRING PROVIDER: Sherial Bail, MD    PT End of Session - 01/20/24 1313     Visit Number 117    Number of Visits 130    Date for Recertification  02/24/24    Progress Note Due on Visit 120    PT Start Time 1315    PT Stop Time 1400    PT Time Calculation (min) 45 min    Equipment Utilized During Treatment Gait belt    Activity Tolerance Patient tolerated treatment well    Behavior During Therapy WFL for tasks assessed/performed                Past Medical History:  Diagnosis Date   Asthma    GERD (gastroesophageal reflux disease)    Hyperlipidemia    Hypertension    Obesity    Stroke [facility])    Past Surgical History:  Procedure Laterality Date   BRAIN SURGERY     Patient Active Problem List   Diagnosis Date Noted   Impaired glucose tolerance 03/23/2020   Learning disability 03/23/2020   Urinary and fecal incontinence 06/14/2019   Chronic pain syndrome 03/23/2019   Constipation 03/23/2019   Dysarthria 03/23/2019   Dysphagia 03/23/2019   Learning difficulty 03/23/2019   Morbid obesity (HCC) 03/23/2019   Muscle weakness 03/23/2019   Vitamin D deficiency 03/23/2019   Bilateral carotid artery stenosis 12/15/2018   Moderate aortic valve stenosis 12/15/2018   BMI 45.0-49.9, adult (HCC) 11/10/2018   Cerebrovascular accident (HCC) 05/02/2016   Hemiparesis affecting right side as late effect of cerebrovascular  accident (HCC) 05/01/2016   Brain tumor (HCC) 11/24/2014   Gastro-esophageal reflux disease without esophagitis 11/24/2014   Accident due to mechanical fall without injury 08/03/2014   Essential hypertension 06/13/2014   Mixed hyperlipidemia 06/13/2014   Unspecified sequelae of cerebral infarction 06/13/2014   Thyromegaly 01/07/2014   Type 2 diabetes mellitus without complication 01/06/2014   Neck mass 09/08/2013   Swimmer's ear 09/08/2013   Erectile dysfunction 01/03/2013   Prediabetes 01/03/2013   ONSET DATE: 05/09/2016  REFERRING DIAG: I69.351 (ICD-10-CM) - Hemiplegia and hemiparesis following cerebral infarction affecting right dominant side   THERAPY DIAG:  Unsteadiness on feet  Difficulty in walking, not elsewhere classified  Muscle weakness (generalized)  Abnormality of gait and mobility  Left-sided weakness  Repeated falls  Other abnormalities of gait and mobility  Hemiplegia and hemiparesis following cerebral infarction affecting right dominant side (HCC)  Other lack of coordination  Acute CVA (cerebrovascular accident) (HCC)   Rationale for Evaluation and Treatment Rehabilitation  SUBJECTIVE:  SUBJECTIVE STATEMENT  Pt denies any new pain or complaints since the last time he was seen.    Pt accompanied by: aunt  PERTINENT HISTORY:  The pt is a pleasant 58 yo male who returns to PT for evaluation of deficits s/o old CVA. Pt with hx of CVA Feb 2018. He was previously fully independent, but CVA caused significant deficits, mainly affecting his right side. Pt reports continued difficulty with walking and transfers. Pt still using WC for majority of mobility, otherwise he uses his RW. Pt lives with his mom who is his main caregiver. Patient had outpatient PT previously in clinic  in 2023. He reports since discharge he has been completing his HEP and walking about every day. Pt would like to improve his walking mainly, but also would like to work on balance. He also reports difficulty with transfers. Pt's mom and his caregiver helps him with ADLs. However, he says he is now waiting for a new caregiver. Prior to this pt had caregiver 3x/week. PMH significant for hx of brain tumor, past CVA, DMII and HTN. See chart for additional information.  PAIN:  Are you having pain? Pt reports no pain currently  PRECAUTIONS: fall  WEIGHT BEARING RESTRICTIONS none  PLOF: required assistance with ADLs (old CVA), prior to CVA in 2018 was indep.  History of Falls: Pt reports no recent falls  Home information: Pt lives with mother who is his primary caregiver. Previously had home aide, 3x/week but reports currently waiting for new one.  PATIENT GOALS: Pt would like to improve his ability to ambulate.  OBJECTIVE:  taken at [facility] unless otherwise noted:   DIAGNOSTIC FINDINGS: No recent imaging per chart  Objective measures taken at eval unless otherwise specified:   Posture: forward head posture, rounded shoulders  MMT LE: grossly  4+/5, exception hip flexors and abductors 4/5 each bilat   Functional tests:  5xSTS:  34 sec, use of BUE (multiple initial attempts with posterior LOB in chair)   : 0.096 m/s with RW, close CGA, and WC follow  Gait for distance (goal 150 ft): deferred due to time  Gait mechanics: impaired, decreased speed (see ), crouched posture, poor postural stability and relies heavily on BUE support on RW. Pt now with bilat AFOS   FOTO: 44 (goal 50)   TODAY'S TREATMENT 01/08/24    TherAct- To improve functional movements patterns for everyday tasks   Stepping in // bars over 1/2 foam roll to simulate threshold at front door at home, x2 total attempts  Lateral side stepping in // bars, length of bars x2  Multiple attempts to stand from walker,  with only one successful attempt, in which the pt then proceeded to ambulate around the gym with the use of the walker and wheelchair follow.    Seated scap rows at cable column, 12.5#, 3x10      PATIENT EDUCATION: Education details: If pt can lean pelvis into support surface, his energy efficicency will be higher.  Person educated: Patient Education method: Explanation, Demonstration, Tactile cues, and Verbal cues Education comprehension: verbalized understanding, returned demonstration, and verbal cues required  HOME EXERCISE PROGRAM:  Standing hip flexor stretch at counter, prolonged standing with erect posture for gluteal activation, HS curls with RTB  Access Code: [email or web address] URL: [email or web address] Date: 05/15/2023 Prepared by: Connell Kiss  Exercises - Seated Long Arc Quad with Ankle Weight  - 1 x daily - 7 x weekly - 2 sets - 10 reps  GOALS: Goals reviewed with patient?  No  SHORT TERM GOALS: Target date: 11/12/2022  Pt will be independent with HEP in order to improve strength and balance in order to decrease fall risk and improve function at home. Baseline: instructed in HEP today, to provide printout next visit Goal status: MET  LONG TERM GOALS: Target date: 02/24/2024  Pt will increase FOTO to at least 50 in order to demonstrate improvement in function related to mobility and QoL. Baseline: 44 7/29:48 9/10:40 12/24/2022= 51 Goal status: MET  2.  Pt (< 60 yrs old) will complete 5xSTS test < 30 seconds indicating an increase in LE strength and improved balance. Baseline: 10/01/22 34 sec (multiple initial attempts with posterior LOB in chair) 12/20: 49 seconds 11/04/22:1:06, min A on multiple attempts to maintain balance and pt stays in flexed posture throughout test when coming to standing.   9/10: 27.5 sec, Pushes WC post consistently, close CGA  10/22: 47 seconds first attempt 12/5: 1 minute, 37 seconds, multiple attempts to get first stand, but no  difficulty after that; 03/20/2023= 1 min 2 sec with 1UE on walker and 1 on armrest- some posterior lean- Close CGA; 06/17/2023= 38 sec with 1 UE on walker and 1 on armrest; 08/21/2023= 35 sec with 1UE on walker and 1 on armrest.  8/5: 36 sec with 1 UE on walker 1 on arm rest  Goal status: ONGOING  3.  Pt will increase to > 0.5 m/s (30 sec) as to improve gait speed for better home ambulation and reduce fall risk. Baseline: 10/01/22 0.096 m/s with RW, close CGA, and WC follow previously 12/27: 0.11 m/s 11/04/22: .056 m/s  9/10: 076 m/s 10/22: 0.64 m/s  11/26: .076 m/s; 03/20/2023= 0.08 m/s; 04/22/2023= 0.089 m/s; 05/15/2023 = 0.079 m/s; 05/27/2023= 0.11 m/s; 07/17/2023= 0.15 m/s; 08/21/2023= 0.14 m/s avg with Bariatric walker; 09/09/2023= 0.14 m/s; 10/02/2023= 0.09 m/s with Bariatric walker and Increased cues to swing RLE through vs. Previous sesisons. 11/13/2023= 0.12 m/s with Bariatric walker; 12/11/2023= 0.14 m/s Goal status: ONGOING  4.  Pt will increase hip flexor and abductor  gross strength to 4+/5 bilat as to improve functional strength for independent gait, increased standing tolerance, and increased ADL ability. Baseline: 10/01/22 4+/5, exception hip flexors and abductors 4/5 each bilat  (previously 03/27/22: 4+/5 ) 9/10: Hip flexor 4/5, hip abductor 4+/5 10/22: hip flexor 4/5 hip abductor 4+/5 11/26: hip flexor 4+/5 hip abductor 4+/5 Goal status: MET  5.  Pt will ambulate 150 ft with BRW and WC follow with no rest breaks in less than 12 min to allow for improved mobility within home and increased independence. Baseline: 10/03/22 150' with BRW 04/03/22: 105 ft with BRW 7/29: 100 in 9:30 with pediatric RW 9/10:101 ft with BRW stooped posture worsened throughout test 12/24/2022- 150 feet using BRW, CGA with w/c follow- in 10 min 17 sec. Will keep goal to make sure he can do this consistently 10/22: 119 ft with BRW 8 min 23 seconds terminated by patient  11/26: 148 ft with BRW 14 min 34 seconds  terminated by patient 03/20/2023= 152 feet using BRW, CGA and w/c follow in 14 min 3 sec- Need to keep goal active to ensure consistency but will revised and add a time element to goal. 04/22/2023= 162 feet in 11 min 54 sec using bariatric RW, CGA and close CGA assist; 05/27/2023- 170 feet without rest break using bariatric RW. 06/17/2023= 160 feet with bariatric 4WW in 9 min- stopped due to fatigue.  Goal status: MET  6. Pt will decrease TUG to below 1 min 30 seconds/decrease in order to demonstrate decreased fall risk. Baseline: 06/17/2023= 2 min using bariatric 4WW; 09/09/2023= 1 min 50 sec with bariatric front wheeled walker. 12/02/2023= 1 min 44 sec  Goal status: PROGRESSING  7. Pt will ambulate 300 ft with BRW, CGA and WC follow with no rest breaks in less than 20 min to allow for improved mobility within home/community and increased independence. Baseline: 06/17/2023= 160 feet with bariatric 4WW in 9 min- stopped due to fatigue. 07/17/2023- Patient able to walk 270 feet in > 20 min with BRW, CGA and close w/c follow today- VC for posture and for gait sequencing; 08/21/2023- Not specifically testing but patient has been walking 160 feet or more each Visit; 09/09/2023= Patient ambulated 180 feet overall at one time today using bariatric RW, CGA with VC for erect posture and for increased step length. 10/02/2023= 165 feet with Bariatric RW- close w/c follow and CGA. More diffiuclty with gait sequencing more consistent VC for erect posture. 12/02/2023- Patient ambulated >100 feet in // bars today but will reassess using walker next visit. 12/11/2023= Patient ambulated 204 feet with BRW and Close CGA and w/c follow Goal Status: PROGRESSING  8. Patient will report ability to walk out his front door including stepping over threshold using his walker with min A of caregiver to enable him to be more independent with household mobility and be able to go out to porch with less dependence on caregivers. Baseline: Patient  dependent on caregivers to push him in his wheelchair to go out to porch and reports dependent on them to always assist so he doesn't go outside like he would like to. Goal status: NEW  ASSESSMENT :  CLINICAL IMPRESSION:     Pt put forth great effort throughout the session, however has had a slight decline in his ability to come upright into standing from his wheelchair.  Pt noted to have increased difficulty even with moderate assistance from the therapist.  Pt eventually able to stand and then ambulated around the gym ~40', before fatiguing and needing to sit.  Pt then transferred back to the waiting room.  Pt will continue to be monitored over the next few sessions to assess the response to therapy and fatigue limitations.   Pt will continue to benefit from skilled therapy to address remaining deficits in order to improve overall QoL and return to PLOF.     OBJECTIVE IMPAIRMENTS Abnormal gait, decreased activity tolerance, decreased balance, decreased coordination, decreased endurance, decreased mobility, difficulty walking, decreased ROM, decreased strength, improper body mechanics, postural dysfunction, and obesity.   ACTIVITY LIMITATIONS carrying, lifting, bending, sitting, standing, squatting, stairs, transfers, bed mobility, continence, bathing, toileting, dressing, hygiene/grooming, and locomotion level  PARTICIPATION LIMITATIONS: meal prep, cleaning, laundry, interpersonal relationship, driving, shopping, community activity, and yard work  PERSONAL FACTORS Fitness, Past/current experiences, Time since onset of injury/illness/exacerbation, and 3+ comorbidities: hx of brain tumor, past CVA, HTN, obesity, learning disability, DMII are also affecting patient's functional outcome.   REHAB POTENTIAL: Fair    CLINICAL DECISION MAKING: Stable/uncomplicated  EVALUATION COMPLEXITY: Low  PLAN: PT FREQUENCY: 1-2x/week  PT DURATION: 12 weeks  PLANNED INTERVENTIONS: Therapeutic exercises,  Therapeutic activity, Neuromuscular re-education, Balance training, Gait training, Patient/Family education, Joint mobilization, Stair training, Vestibular training, Orthotic/Fit training, DME instructions, Dry Needling, Electrical stimulation, Wheelchair mobility training, Cryotherapy, Moist heat, Compression bandaging, Manual therapy, and Re-evaluation  PLAN FOR NEXT SESSION:   Continue to monitor difficulty with transfers and progress  as needed.   -Bridging and review of some bed LE strengthening -Continue with functional strengthening exercises of the LE's  -Gait activities using RW. Incorporate proper technique with all transfers to decrease his risk of falling.    Fonda LELON Simpers PT  Physical Therapist- [place]  [facility]  3:37 PM, 01/20/24

## 2024-01-22 ENCOUNTER — Ambulatory Visit

## 2024-01-22 DIAGNOSIS — R278 Other lack of coordination: Secondary | ICD-10-CM

## 2024-01-22 DIAGNOSIS — R531 Weakness: Secondary | ICD-10-CM

## 2024-01-22 DIAGNOSIS — M6281 Muscle weakness (generalized): Secondary | ICD-10-CM

## 2024-01-22 DIAGNOSIS — R296 Repeated falls: Secondary | ICD-10-CM

## 2024-01-22 DIAGNOSIS — R2689 Other abnormalities of gait and mobility: Secondary | ICD-10-CM

## 2024-01-22 DIAGNOSIS — R262 Difficulty in walking, not elsewhere classified: Secondary | ICD-10-CM

## 2024-01-22 DIAGNOSIS — R269 Unspecified abnormalities of gait and mobility: Secondary | ICD-10-CM

## 2024-01-22 DIAGNOSIS — I69351 Hemiplegia and hemiparesis following cerebral infarction affecting right dominant side: Secondary | ICD-10-CM

## 2024-01-22 DIAGNOSIS — R2681 Unsteadiness on feet: Secondary | ICD-10-CM

## 2024-01-22 NOTE — Therapy (Signed)
 OUTPATIENT PHYSICAL THERAPY TREATMENT   Patient Name: Curtis Powell MRN: [id number] DOB:01-07-1966, 58 y.o., male Today's Date: 01/23/2024  PCP: Sherial Bail, MD  REFERRING PROVIDER: Sherial Bail, MD    PT End of Session - 01/22/24 1328     Visit Number 118    Number of Visits 130    Date for Recertification  02/24/24    Progress Note Due on Visit 120    PT Start Time 1315    PT Stop Time 1356    PT Time Calculation (min) 41 min    Equipment Utilized During Treatment Gait belt    Activity Tolerance Patient tolerated treatment well    Behavior During Therapy WFL for tasks assessed/performed                Past Medical History:  Diagnosis Date   Asthma    GERD (gastroesophageal reflux disease)    Hyperlipidemia    Hypertension    Obesity    Stroke [facility])    Past Surgical History:  Procedure Laterality Date   BRAIN SURGERY     Patient Active Problem List   Diagnosis Date Noted   Impaired glucose tolerance 03/23/2020   Learning disability 03/23/2020   Urinary and fecal incontinence 06/14/2019   Chronic pain syndrome 03/23/2019   Constipation 03/23/2019   Dysarthria 03/23/2019   Dysphagia 03/23/2019   Learning difficulty 03/23/2019   Morbid obesity (HCC) 03/23/2019   Muscle weakness 03/23/2019   Vitamin D deficiency 03/23/2019   Bilateral carotid artery stenosis 12/15/2018   Moderate aortic valve stenosis 12/15/2018   BMI 45.0-49.9, adult (HCC) 11/10/2018   Cerebrovascular accident (HCC) 05/02/2016   Hemiparesis affecting right side as late effect of cerebrovascular accident (HCC) 05/01/2016   Brain tumor (HCC) 11/24/2014   Gastro-esophageal reflux disease without esophagitis 11/24/2014   Accident due to mechanical fall without injury 08/03/2014   Essential hypertension 06/13/2014   Mixed hyperlipidemia 06/13/2014   Unspecified sequelae of cerebral infarction 06/13/2014   Thyromegaly 01/07/2014   Type 2 diabetes mellitus without  complication 01/06/2014   Neck mass 09/08/2013   Swimmer's ear 09/08/2013   Erectile dysfunction 01/03/2013   Prediabetes 01/03/2013   ONSET DATE: 05/09/2016  REFERRING DIAG: I69.351 (ICD-10-CM) - Hemiplegia and hemiparesis following cerebral infarction affecting right dominant side   THERAPY DIAG:  Unsteadiness on feet  Difficulty in walking, not elsewhere classified  Muscle weakness (generalized)  Abnormality of gait and mobility  Left-sided weakness  Repeated falls  Other abnormalities of gait and mobility  Hemiplegia and hemiparesis following cerebral infarction affecting right dominant side (HCC)  Other lack of coordination   Rationale for Evaluation and Treatment Rehabilitation  SUBJECTIVE:  SUBJECTIVE STATEMENT  Patient reports feeling much today than previous visit and last week.   Pt accompanied by: aunt  PERTINENT HISTORY:  The pt is a pleasant 58 yo male who returns to PT for evaluation of deficits s/o old CVA. Pt with hx of CVA Feb 2018. He was previously fully independent, but CVA caused significant deficits, mainly affecting his right side. Pt reports continued difficulty with walking and transfers. Pt still using WC for majority of mobility, otherwise he uses his RW. Pt lives with his mom who is his main caregiver. Patient had outpatient PT previously in clinic in 2023. He reports since discharge he has been completing his HEP and walking about every day. Pt would like to improve his walking mainly, but also would like to work on balance. He also reports difficulty with transfers. Pt's mom and his caregiver helps him with ADLs. However, he says he is now waiting for a new caregiver. Prior to this pt had caregiver 3x/week. PMH significant for hx of brain tumor, past CVA, DMII and  HTN. See chart for additional information.  PAIN:  Are you having pain? Pt reports no pain currently  PRECAUTIONS: fall  WEIGHT BEARING RESTRICTIONS none  PLOF: required assistance with ADLs (old CVA), prior to CVA in 2018 was indep.  History of Falls: Pt reports no recent falls  Home information: Pt lives with mother who is his primary caregiver. Previously had home aide, 3x/week but reports currently waiting for new one.  PATIENT GOALS: Pt would like to improve his ability to ambulate.  OBJECTIVE:  taken at [facility] unless otherwise noted:   DIAGNOSTIC FINDINGS: No recent imaging per chart  Objective measures taken at eval unless otherwise specified:   Posture: forward head posture, rounded shoulders  MMT LE: grossly  4+/5, exception hip flexors and abductors 4/5 each bilat   Functional tests:  5xSTS:  34 sec, use of BUE (multiple initial attempts with posterior LOB in chair)   : 0.096 m/s with RW, close CGA, and WC follow  Gait for distance (goal 150 ft): deferred due to time  Gait mechanics: impaired, decreased speed (see ), crouched posture, poor postural stability and relies heavily on BUE support on RW. Pt now with bilat AFOS   FOTO: 44 (goal 50)   TODAY'S TREATMENT : 01/23/24    TA:   Sit to stand with 1 hand on armrest and the other on railing of bars ) x 15 reps  Static stand x 2 min without UE support   Gait: Ambulation in // bars, CGA with B AFO donned - step to  to short minimal step through gait technique- with very slow cadence and increased effort to advance each LE. Patient performed total of 6 rds of walking forward then turn - length of bars  and back         PATIENT EDUCATION: Education details: If pt can lean pelvis into support surface, his energy efficicency will be higher.  Person educated: Patient Education method: Explanation, Demonstration, Tactile cues, and Verbal cues Education comprehension: verbalized understanding,  returned demonstration, and verbal cues required  HOME EXERCISE PROGRAM:  Standing hip flexor stretch at counter, prolonged standing with erect posture for gluteal activation, HS curls with RTB  Access Code: [email or web address] URL: [email or web address] Date: 05/15/2023 Prepared by: Connell Kiss  Exercises - Seated Long Arc Quad with Ankle Weight  - 1 x daily - 7 x weekly - 2 sets - 10 reps  GOALS: Goals reviewed with patient? No  SHORT TERM GOALS: Target date: 11/12/2022  Pt will be independent with HEP in order to improve strength and balance in order to decrease fall risk and improve function at home. Baseline: instructed in HEP today, to provide printout next visit Goal status: MET  LONG TERM GOALS: Target date: 02/24/2024  Pt will increase FOTO to at least 50 in order to demonstrate improvement in function related to mobility and QoL. Baseline: 44 7/29:48 9/10:40 12/24/2022= 51 Goal status: MET  2.  Pt (< 60 yrs old) will complete 5xSTS test < 30 seconds indicating an increase in LE strength and improved balance. Baseline: 10/01/22 34 sec (multiple initial attempts with posterior LOB in chair) 12/20: 49 seconds 11/04/22:1:06, min A on multiple attempts to maintain balance and pt stays in flexed posture throughout test when coming to standing.   9/10: 27.5 sec, Pushes WC post consistently, close CGA  10/22: 47 seconds first attempt 12/5: 1 minute, 37 seconds, multiple attempts to get first stand, but no difficulty after that; 03/20/2023= 1 min 2 sec with 1UE on walker and 1 on armrest- some posterior lean- Close CGA; 06/17/2023= 38 sec with 1 UE on walker and 1 on armrest; 08/21/2023= 35 sec with 1UE on walker and 1 on armrest.  8/5: 36 sec with 1 UE on walker 1 on arm rest; 12/23/2023= 37.05 sec with some posterior LOB- CGA to min A to correct.  Goal status: ONGOING  3.  Pt will increase to > 0.5 m/s (30 sec) as to improve gait speed for better home ambulation and reduce  fall risk. Baseline: 10/01/22 0.096 m/s with RW, close CGA, and WC follow previously 12/27: 0.11 m/s 11/04/22: .056 m/s  9/10: 076 m/s 10/22: 0.64 m/s  11/26: .076 m/s; 03/20/2023= 0.08 m/s; 04/22/2023= 0.089 m/s; 05/15/2023 = 0.079 m/s; 05/27/2023= 0.11 m/s; 07/17/2023= 0.15 m/s; 08/21/2023= 0.14 m/s avg with Bariatric walker; 09/09/2023= 0.14 m/s; 10/02/2023= 0.09 m/s with Bariatric walker and Increased cues to swing RLE through vs. Previous sesisons. 11/13/2023= 0.12 m/s with Bariatric walker; 12/11/2023= 0.14 m/s; 12/23/2023=0.135 m/s Goal status: ONGOING  4.  Pt will increase hip flexor and abductor  gross strength to 4+/5 bilat as to improve functional strength for independent gait, increased standing tolerance, and increased ADL ability. Baseline: 10/01/22 4+/5, exception hip flexors and abductors 4/5 each bilat  (previously 03/27/22: 4+/5 ) 9/10: Hip flexor 4/5, hip abductor 4+/5 10/22: hip flexor 4/5 hip abductor 4+/5 11/26: hip flexor 4+/5 hip abductor 4+/5 Goal status: MET  5.  Pt will ambulate 150 ft with BRW and WC follow with no rest breaks in less than 12 min to allow for improved mobility within home and increased independence. Baseline: 10/03/22 150' with BRW 04/03/22: 105 ft with BRW 7/29: 100 in 9:30 with pediatric RW 9/10:101 ft with BRW stooped posture worsened throughout test 12/24/2022- 150 feet using BRW, CGA with w/c follow- in 10 min 17 sec. Will keep goal to make sure he can do this consistently 10/22: 119 ft with BRW 8 min 23 seconds terminated by patient  11/26: 148 ft with BRW 14 min 34 seconds terminated by patient 03/20/2023= 152 feet using BRW, CGA and w/c follow in 14 min 3 sec- Need to keep goal active to ensure consistency but will revised and add a time element to goal. 04/22/2023= 162 feet in 11 min 54 sec using bariatric RW, CGA and close CGA assist; 05/27/2023- 170 feet without rest break using bariatric RW. 06/17/2023= 160 feet with bariatric 4WW in  9 min- stopped due to  fatigue.  Goal status: MET   6. Pt will decrease TUG to below 1 min 30 seconds/decrease in order to demonstrate decreased fall risk. Baseline: 06/17/2023= 2 min using bariatric 4WW; 09/09/2023= 1 min 50 sec with bariatric front wheeled walker. 12/02/2023= 1 min 44 sec; 12/23/2023= 2 min 8 sec avg with BRW Goal status: ONGOING  7. Pt will ambulate 300 ft with BRW, CGA and WC follow with no rest breaks in less than 20 min to allow for improved mobility within home/community and increased independence. Baseline: 06/17/2023= 160 feet with bariatric 4WW in 9 min- stopped due to fatigue. 07/17/2023- Patient able to walk 270 feet in > 20 min with BRW, CGA and close w/c follow today- VC for posture and for gait sequencing; 08/21/2023- Not specifically testing but patient has been walking 160 feet or more each Visit; 09/09/2023= Patient ambulated 180 feet overall at one time today using bariatric RW, CGA with VC for erect posture and for increased step length. 10/02/2023= 165 feet with Bariatric RW- close w/c follow and CGA. More diffiuclty with gait sequencing more consistent VC for erect posture. 12/02/2023- Paitent ambulated >100 feet in // bars today but will reassess using walker next visit. 12/11/2023= Patient ambulated 204 feet with BRW and Close CGA and w/c follow Goal Status: PROGRESSING  8. Patient will report ability to walk out his front door including stepping over threshold using his walker with min A of caregiver to enable him to be more independent with household mobility and be able to go out to porch with less dependence on caregivers. Baseline: Patient dependent on caregivers to push him in his wheelchair to go out to porch and reports dependent on them to always assist so he doesn't go outside like he would like to. Goal status: NEW  ASSESSMENT :  CLINICAL IMPRESSION:   Patient presented with much improved functional mobility- able to stand with CGA to min A today with all sit to stands and able to  walk in // bars with BUE Support multiple times with much improved step mobility. Patient reported feeling tired at end of session yet happy to be moving around better.   Pt will continue to benefit from skilled physical therapy intervention to address impairments, improve QOL, and attain therapy goals.     OBJECTIVE IMPAIRMENTS Abnormal gait, decreased activity tolerance, decreased balance, decreased coordination, decreased endurance, decreased mobility, difficulty walking, decreased ROM, decreased strength, improper body mechanics, postural dysfunction, and obesity.   ACTIVITY LIMITATIONS carrying, lifting, bending, sitting, standing, squatting, stairs, transfers, bed mobility, continence, bathing, toileting, dressing, hygiene/grooming, and locomotion level  PARTICIPATION LIMITATIONS: meal prep, cleaning, laundry, interpersonal relationship, driving, shopping, community activity, and yard work  PERSONAL FACTORS Fitness, Past/current experiences, Time since onset of injury/illness/exacerbation, and 3+ comorbidities: hx of brain tumor, past CVA, HTN, obesity, learning disability, DMII are also affecting patient's functional outcome.   REHAB POTENTIAL: Fair    CLINICAL DECISION MAKING: Stable/uncomplicated  EVALUATION COMPLEXITY: Low  PLAN: PT FREQUENCY: 1-2x/week  PT DURATION: 12 weeks  PLANNED INTERVENTIONS: Therapeutic exercises, Therapeutic activity, Neuromuscular re-education, Balance training, Gait training, Patient/Family education, Joint mobilization, Stair training, Vestibular training, Orthotic/Fit training, DME instructions, Dry Needling, Electrical stimulation, Wheelchair mobility training, Cryotherapy, Moist heat, Compression bandaging, Manual therapy, and Re-evaluation  PLAN FOR NEXT SESSION:   -Bridging and review of some bed LE strengthening -Continue with functional strengthening exercises of the LE's  -Gait activities using RW. Incorporate proper technique with all  transfers to  decrease his risk of falling.   Reyes LOISE London PT  Physical Therapist- [place]  [facility]   10:15 AM 01/23/24    OUTPATIENT PHYSICAL THERAPY TREATMENT  Patient Name: Curtis Powell MRN: [id number] DOB:1965/09/28, 58 y.o., male Today's Date: 01/23/2024  PCP: Sherial Bail, MD  REFERRING PROVIDER: Sherial Bail, MD    PT End of Session - 01/22/24 1328     Visit Number 118    Number of Visits 130    Date for Recertification  02/24/24    Progress Note Due on Visit 120    PT Start Time 1315    PT Stop Time 1356    PT Time Calculation (min) 41 min    Equipment Utilized During Treatment Gait belt    Activity Tolerance Patient tolerated treatment well    Behavior During Therapy WFL for tasks assessed/performed                Past Medical History:  Diagnosis Date   Asthma    GERD (gastroesophageal reflux disease)    Hyperlipidemia    Hypertension    Obesity    Stroke [facility])    Past Surgical History:  Procedure Laterality Date   BRAIN SURGERY     Patient Active Problem List   Diagnosis Date Noted   Impaired glucose tolerance 03/23/2020   Learning disability 03/23/2020   Urinary and fecal incontinence 06/14/2019   Chronic pain syndrome 03/23/2019   Constipation 03/23/2019   Dysarthria 03/23/2019   Dysphagia 03/23/2019   Learning difficulty 03/23/2019   Morbid obesity (HCC) 03/23/2019   Muscle weakness 03/23/2019   Vitamin D deficiency 03/23/2019   Bilateral carotid artery stenosis 12/15/2018   Moderate aortic valve stenosis 12/15/2018   BMI 45.0-49.9, adult (HCC) 11/10/2018   Cerebrovascular accident (HCC) 05/02/2016   Hemiparesis affecting right side as late effect of cerebrovascular accident (HCC) 05/01/2016   Brain tumor (HCC) 11/24/2014   Gastro-esophageal reflux disease without esophagitis 11/24/2014   Accident due to mechanical fall without injury 08/03/2014   Essential hypertension 06/13/2014    Mixed hyperlipidemia 06/13/2014   Unspecified sequelae of cerebral infarction 06/13/2014   Thyromegaly 01/07/2014   Type 2 diabetes mellitus without complication 01/06/2014   Neck mass 09/08/2013   Swimmer's ear 09/08/2013   Erectile dysfunction 01/03/2013   Prediabetes 01/03/2013   ONSET DATE: 05/09/2016  REFERRING DIAG: I69.351 (ICD-10-CM) - Hemiplegia and hemiparesis following cerebral infarction affecting right dominant side   THERAPY DIAG:  Unsteadiness on feet  Difficulty in walking, not elsewhere classified  Muscle weakness (generalized)  Abnormality of gait and mobility  Left-sided weakness  Repeated falls  Other abnormalities of gait and mobility  Hemiplegia and hemiparesis following cerebral infarction affecting right dominant side (HCC)  Other lack of coordination   Rationale for Evaluation and Treatment Rehabilitation  SUBJECTIVE:  SUBJECTIVE STATEMENT  Pt denies any new pain or complaints since the last time he was seen.    Pt accompanied by: aunt  PERTINENT HISTORY:  The pt is a pleasant 58 yo male who returns to PT for evaluation of deficits s/o old CVA. Pt with hx of CVA Feb 2018. He was previously fully independent, but CVA caused significant deficits, mainly affecting his right side. Pt reports continued difficulty with walking and transfers. Pt still using WC for majority of mobility, otherwise he uses his RW. Pt lives with his mom who is his main caregiver. Patient had outpatient PT previously in clinic in 2023. He reports since discharge he has been completing his HEP and walking about every day. Pt would like to improve his walking mainly, but also would like to work on balance. He also reports difficulty with transfers. Pt's mom and his caregiver helps him with  ADLs. However, he says he is now waiting for a new caregiver. Prior to this pt had caregiver 3x/week. PMH significant for hx of brain tumor, past CVA, DMII and HTN. See chart for additional information.  PAIN:  Are you having pain? Pt reports no pain currently  PRECAUTIONS: fall  WEIGHT BEARING RESTRICTIONS none  PLOF: required assistance with ADLs (old CVA), prior to CVA in 2018 was indep.  History of Falls: Pt reports no recent falls  Home information: Pt lives with mother who is his primary caregiver. Previously had home aide, 3x/week but reports currently waiting for new one.  PATIENT GOALS: Pt would like to improve his ability to ambulate.  OBJECTIVE:  taken at [facility] unless otherwise noted:   DIAGNOSTIC FINDINGS: No recent imaging per chart  Objective measures taken at eval unless otherwise specified:   Posture: forward head posture, rounded shoulders  MMT LE: grossly  4+/5, exception hip flexors and abductors 4/5 each bilat   Functional tests:  5xSTS:  34 sec, use of BUE (multiple initial attempts with posterior LOB in chair)   : 0.096 m/s with RW, close CGA, and WC follow  Gait for distance (goal 150 ft): deferred due to time  Gait mechanics: impaired, decreased speed (see ), crouched posture, poor postural stability and relies heavily on BUE support on RW. Pt now with bilat AFOS   FOTO: 44 (goal 50)   TODAY'S TREATMENT 01/08/24    TherAct- To improve functional movements patterns for everyday tasks   Stepping in // bars over 1/2 foam roll to simulate threshold at front door at home, x2 total attempts  Lateral side stepping in // bars, length of bars x2  Multiple attempts to stand from walker, with only one successful attempt, in which the pt then proceeded to ambulate around the gym with the use of the walker and wheelchair follow.    Seated scap rows at cable column, 12.5#, 3x10      PATIENT EDUCATION: Education details: If pt can lean pelvis  into support surface, his energy efficicency will be higher.  Person educated: Patient Education method: Explanation, Demonstration, Tactile cues, and Verbal cues Education comprehension: verbalized understanding, returned demonstration, and verbal cues required  HOME EXERCISE PROGRAM:  Standing hip flexor stretch at counter, prolonged standing with erect posture for gluteal activation, HS curls with RTB  Access Code: [email or web address] URL: [email or web address] Date: 05/15/2023 Prepared by: Connell Kiss  Exercises - Seated Long Arc Quad with Ankle Weight  - 1 x daily - 7 x weekly - 2 sets - 10 reps  GOALS: Goals reviewed with patient?  No  SHORT TERM GOALS: Target date: 11/12/2022  Pt will be independent with HEP in order to improve strength and balance in order to decrease fall risk and improve function at home. Baseline: instructed in HEP today, to provide printout next visit Goal status: MET  LONG TERM GOALS: Target date: 02/24/2024  Pt will increase FOTO to at least 50 in order to demonstrate improvement in function related to mobility and QoL. Baseline: 44 7/29:48 9/10:40 12/24/2022= 51 Goal status: MET  2.  Pt (< 60 yrs old) will complete 5xSTS test < 30 seconds indicating an increase in LE strength and improved balance. Baseline: 10/01/22 34 sec (multiple initial attempts with posterior LOB in chair) 12/20: 49 seconds 11/04/22:1:06, min A on multiple attempts to maintain balance and pt stays in flexed posture throughout test when coming to standing.   9/10: 27.5 sec, Pushes WC post consistently, close CGA  10/22: 47 seconds first attempt 12/5: 1 minute, 37 seconds, multiple attempts to get first stand, but no difficulty after that; 03/20/2023= 1 min 2 sec with 1UE on walker and 1 on armrest- some posterior lean- Close CGA; 06/17/2023= 38 sec with 1 UE on walker and 1 on armrest; 08/21/2023= 35 sec with 1UE on walker and 1 on armrest.  8/5: 36 sec with 1 UE on walker 1 on  arm rest  Goal status: ONGOING  3.  Pt will increase to > 0.5 m/s (30 sec) as to improve gait speed for better home ambulation and reduce fall risk. Baseline: 10/01/22 0.096 m/s with RW, close CGA, and WC follow previously 12/27: 0.11 m/s 11/04/22: .056 m/s  9/10: 076 m/s 10/22: 0.64 m/s  11/26: .076 m/s; 03/20/2023= 0.08 m/s; 04/22/2023= 0.089 m/s; 05/15/2023 = 0.079 m/s; 05/27/2023= 0.11 m/s; 07/17/2023= 0.15 m/s; 08/21/2023= 0.14 m/s avg with Bariatric walker; 09/09/2023= 0.14 m/s; 10/02/2023= 0.09 m/s with Bariatric walker and Increased cues to swing RLE through vs. Previous sesisons. 11/13/2023= 0.12 m/s with Bariatric walker; 12/11/2023= 0.14 m/s Goal status: ONGOING  4.  Pt will increase hip flexor and abductor  gross strength to 4+/5 bilat as to improve functional strength for independent gait, increased standing tolerance, and increased ADL ability. Baseline: 10/01/22 4+/5, exception hip flexors and abductors 4/5 each bilat  (previously 03/27/22: 4+/5 ) 9/10: Hip flexor 4/5, hip abductor 4+/5 10/22: hip flexor 4/5 hip abductor 4+/5 11/26: hip flexor 4+/5 hip abductor 4+/5 Goal status: MET  5.  Pt will ambulate 150 ft with BRW and WC follow with no rest breaks in less than 12 min to allow for improved mobility within home and increased independence. Baseline: 10/03/22 150' with BRW 04/03/22: 105 ft with BRW 7/29: 100 in 9:30 with pediatric RW 9/10:101 ft with BRW stooped posture worsened throughout test 12/24/2022- 150 feet using BRW, CGA with w/c follow- in 10 min 17 sec. Will keep goal to make sure he can do this consistently 10/22: 119 ft with BRW 8 min 23 seconds terminated by patient  11/26: 148 ft with BRW 14 min 34 seconds terminated by patient 03/20/2023= 152 feet using BRW, CGA and w/c follow in 14 min 3 sec- Need to keep goal active to ensure consistency but will revised and add a time element to goal. 04/22/2023= 162 feet in 11 min 54 sec using bariatric RW, CGA and close CGA assist;  05/27/2023- 170 feet without rest break using bariatric RW. 06/17/2023= 160 feet with bariatric 4WW in 9 min- stopped due to fatigue.  Goal status: MET  6. Pt will decrease TUG to below 1 min 30 seconds/decrease in order to demonstrate decreased fall risk. Baseline: 06/17/2023= 2 min using bariatric 4WW; 09/09/2023= 1 min 50 sec with bariatric front wheeled walker. 12/02/2023= 1 min 44 sec  Goal status: PROGRESSING  7. Pt will ambulate 300 ft with BRW, CGA and WC follow with no rest breaks in less than 20 min to allow for improved mobility within home/community and increased independence. Baseline: 06/17/2023= 160 feet with bariatric 4WW in 9 min- stopped due to fatigue. 07/17/2023- Patient able to walk 270 feet in > 20 min with BRW, CGA and close w/c follow today- VC for posture and for gait sequencing; 08/21/2023- Not specifically testing but patient has been walking 160 feet or more each Visit; 09/09/2023= Patient ambulated 180 feet overall at one time today using bariatric RW, CGA with VC for erect posture and for increased step length. 10/02/2023= 165 feet with Bariatric RW- close w/c follow and CGA. More diffiuclty with gait sequencing more consistent VC for erect posture. 12/02/2023- Patient ambulated >100 feet in // bars today but will reassess using walker next visit. 12/11/2023= Patient ambulated 204 feet with BRW and Close CGA and w/c follow Goal Status: PROGRESSING  8. Patient will report ability to walk out his front door including stepping over threshold using his walker with min A of caregiver to enable him to be more independent with household mobility and be able to go out to porch with less dependence on caregivers. Baseline: Patient dependent on caregivers to push him in his wheelchair to go out to porch and reports dependent on them to always assist so he doesn't go outside like he would like to. Goal status: NEW  ASSESSMENT :  CLINICAL IMPRESSION:    Patient resumed improved sit to stand  transfers from w/c to //bars today. He was able to lean forward and keep weight forward for majority of stands and less physical assistance.  Pt will continue to benefit from skilled therapy to address remaining deficits in order to improve overall QoL and return to PLOF.     OBJECTIVE IMPAIRMENTS Abnormal gait, decreased activity tolerance, decreased balance, decreased coordination, decreased endurance, decreased mobility, difficulty walking, decreased ROM, decreased strength, improper body mechanics, postural dysfunction, and obesity.   ACTIVITY LIMITATIONS carrying, lifting, bending, sitting, standing, squatting, stairs, transfers, bed mobility, continence, bathing, toileting, dressing, hygiene/grooming, and locomotion level  PARTICIPATION LIMITATIONS: meal prep, cleaning, laundry, interpersonal relationship, driving, shopping, community activity, and yard work  PERSONAL FACTORS Fitness, Past/current experiences, Time since onset of injury/illness/exacerbation, and 3+ comorbidities: hx of brain tumor, past CVA, HTN, obesity, learning disability, DMII are also affecting patient's functional outcome.   REHAB POTENTIAL: Fair    CLINICAL DECISION MAKING: Stable/uncomplicated  EVALUATION COMPLEXITY: Low  PLAN: PT FREQUENCY: 1-2x/week  PT DURATION: 12 weeks  PLANNED INTERVENTIONS: Therapeutic exercises, Therapeutic activity, Neuromuscular re-education, Balance training, Gait training, Patient/Family education, Joint mobilization, Stair training, Vestibular training, Orthotic/Fit training, DME instructions, Dry Needling, Electrical stimulation, Wheelchair mobility training, Cryotherapy, Moist heat, Compression bandaging, Manual therapy, and Re-evaluation  PLAN FOR NEXT SESSION:   Continue to monitor difficulty with transfers and progress as needed.   -Bridging and review of some bed LE strengthening -Continue with functional strengthening exercises of the LE's  -Gait activities using RW.  Incorporate proper technique with all transfers to decrease his risk of falling.    Reyes LOISE London PT  Physical Therapist- [place]  [facility]  10:15 AM, 01/23/24

## 2024-01-26 ENCOUNTER — Other Ambulatory Visit: Payer: Self-pay | Admitting: Internal Medicine

## 2024-01-26 DIAGNOSIS — H532 Diplopia: Secondary | ICD-10-CM

## 2024-01-26 DIAGNOSIS — D329 Benign neoplasm of meninges, unspecified: Secondary | ICD-10-CM

## 2024-01-27 ENCOUNTER — Ambulatory Visit

## 2024-01-27 DIAGNOSIS — R269 Unspecified abnormalities of gait and mobility: Secondary | ICD-10-CM

## 2024-01-27 DIAGNOSIS — M6281 Muscle weakness (generalized): Secondary | ICD-10-CM

## 2024-01-27 DIAGNOSIS — R531 Weakness: Secondary | ICD-10-CM

## 2024-01-27 DIAGNOSIS — R278 Other lack of coordination: Secondary | ICD-10-CM

## 2024-01-27 DIAGNOSIS — R262 Difficulty in walking, not elsewhere classified: Secondary | ICD-10-CM | POA: Diagnosis not present

## 2024-01-27 DIAGNOSIS — R2689 Other abnormalities of gait and mobility: Secondary | ICD-10-CM

## 2024-01-27 DIAGNOSIS — R296 Repeated falls: Secondary | ICD-10-CM

## 2024-01-27 DIAGNOSIS — R2681 Unsteadiness on feet: Secondary | ICD-10-CM

## 2024-01-27 DIAGNOSIS — I69351 Hemiplegia and hemiparesis following cerebral infarction affecting right dominant side: Secondary | ICD-10-CM

## 2024-01-27 NOTE — Therapy (Signed)
 OUTPATIENT PHYSICAL THERAPY TREATMENT   Patient Name: Curtis Powell MRN: [id number] DOB:12-Jul-1965, 58 y.o., male Today's Date: 01/27/2024  PCP: Sherial Bail, MD  REFERRING PROVIDER: Sherial Bail, MD    PT End of Session - 01/27/24 1321     Visit Number 119    Number of Visits 130    Date for Recertification  02/24/24    Progress Note Due on Visit 120    PT Start Time 1316    PT Stop Time 1358    PT Time Calculation (min) 42 min    Equipment Utilized During Treatment Gait belt    Activity Tolerance Patient tolerated treatment well    Behavior During Therapy WFL for tasks assessed/performed                Past Medical History:  Diagnosis Date   Asthma    GERD (gastroesophageal reflux disease)    Hyperlipidemia    Hypertension    Obesity    Stroke [facility])    Past Surgical History:  Procedure Laterality Date   BRAIN SURGERY     Patient Active Problem List   Diagnosis Date Noted   Impaired glucose tolerance 03/23/2020   Learning disability 03/23/2020   Urinary and fecal incontinence 06/14/2019   Chronic pain syndrome 03/23/2019   Constipation 03/23/2019   Dysarthria 03/23/2019   Dysphagia 03/23/2019   Learning difficulty 03/23/2019   Morbid obesity (HCC) 03/23/2019   Muscle weakness 03/23/2019   Vitamin D deficiency 03/23/2019   Bilateral carotid artery stenosis 12/15/2018   Moderate aortic valve stenosis 12/15/2018   BMI 45.0-49.9, adult (HCC) 11/10/2018   Cerebrovascular accident (HCC) 05/02/2016   Hemiparesis affecting right side as late effect of cerebrovascular accident (HCC) 05/01/2016   Brain tumor (HCC) 11/24/2014   Gastro-esophageal reflux disease without esophagitis 11/24/2014   Accident due to mechanical fall without injury 08/03/2014   Essential hypertension 06/13/2014   Mixed hyperlipidemia 06/13/2014   Unspecified sequelae of cerebral infarction 06/13/2014   Thyromegaly 01/07/2014   Type 2 diabetes mellitus without  complication 01/06/2014   Neck mass 09/08/2013   Swimmer's ear 09/08/2013   Erectile dysfunction 01/03/2013   Prediabetes 01/03/2013   ONSET DATE: 05/09/2016  REFERRING DIAG: I69.351 (ICD-10-CM) - Hemiplegia and hemiparesis following cerebral infarction affecting right dominant side   THERAPY DIAG:  Unsteadiness on feet  Difficulty in walking, not elsewhere classified  Muscle weakness (generalized)  Abnormality of gait and mobility  Left-sided weakness  Repeated falls  Other abnormalities of gait and mobility  Hemiplegia and hemiparesis following cerebral infarction affecting right dominant side (HCC)  Other lack of coordination   Rationale for Evaluation and Treatment Rehabilitation  SUBJECTIVE:  SUBJECTIVE STATEMENT  Patient reports doing okay with no updates since last visit   Pt accompanied by: aunt  PERTINENT HISTORY:  The pt is a pleasant 58 yo male who returns to PT for evaluation of deficits s/o old CVA. Pt with hx of CVA Feb 2018. He was previously fully independent, but CVA caused significant deficits, mainly affecting his right side. Pt reports continued difficulty with walking and transfers. Pt still using WC for majority of mobility, otherwise he uses his RW. Pt lives with his mom who is his main caregiver. Patient had outpatient PT previously in clinic in 2023. He reports since discharge he has been completing his HEP and walking about every day. Pt would like to improve his walking mainly, but also would like to work on balance. He also reports difficulty with transfers. Pt's mom and his caregiver helps him with ADLs. However, he says he is now waiting for a new caregiver. Prior to this pt had caregiver 3x/week. PMH significant for hx of brain tumor, past CVA, DMII and HTN. See  chart for additional information.  PAIN:  Are you having pain? Pt reports no pain currently  PRECAUTIONS: fall  WEIGHT BEARING RESTRICTIONS none  PLOF: required assistance with ADLs (old CVA), prior to CVA in 2018 was indep.  History of Falls: Pt reports no recent falls  Home information: Pt lives with mother who is his primary caregiver. Previously had home aide, 3x/week but reports currently waiting for new one.  PATIENT GOALS: Pt would like to improve his ability to ambulate.  OBJECTIVE:  taken at [facility] unless otherwise noted:   DIAGNOSTIC FINDINGS: No recent imaging per chart  Objective measures taken at eval unless otherwise specified:   Posture: forward head posture, rounded shoulders  MMT LE: grossly  4+/5, exception hip flexors and abductors 4/5 each bilat   Functional tests:  5xSTS:  34 sec, use of BUE (multiple initial attempts with posterior LOB in chair)   : 0.096 m/s with RW, close CGA, and WC follow  Gait for distance (goal 150 ft): deferred due to time  Gait mechanics: impaired, decreased speed (see ), crouched posture, poor postural stability and relies heavily on BUE support on RW. Pt now with bilat AFOS   FOTO: 44 (goal 50)   TODAY'S TREATMENT : 01/27/24    TA:  At steps: -Sit to stand with 1 hand on armrest and the other on railing of steps x 15 reps- VC for anterior trunk lean -Step taps alt LE with BUE Support  2 x 15 reps (min difficulty with last few reps) *Increased time to complete and 9/10 reported effort.     Static stand x 2 min without UE support   Gait: Ambulation in // bars, CGA with B AFO donned - step to  to short minimal step through gait technique- with very slow cadence and increased effort to advance each LE. Patient performed total of 6 rds of walking forward then turn - length of bars  and back         PATIENT EDUCATION: Education details: If pt can lean pelvis into support surface, his energy efficicency  will be higher.  Person educated: Patient Education method: Explanation, Demonstration, Tactile cues, and Verbal cues Education comprehension: verbalized understanding, returned demonstration, and verbal cues required  HOME EXERCISE PROGRAM:  Standing hip flexor stretch at counter, prolonged standing with erect posture for gluteal activation, HS curls with RTB  Access Code: [email or web address] URL: [email or web address] Date: 05/15/2023 Prepared by: Connell  Pippin  Exercises - Seated Long Arc Quad with Ankle Weight  - 1 x daily - 7 x weekly - 2 sets - 10 reps  GOALS: Goals reviewed with patient? No  SHORT TERM GOALS: Target date: 11/12/2022  Pt will be independent with HEP in order to improve strength and balance in order to decrease fall risk and improve function at home. Baseline: instructed in HEP today, to provide printout next visit Goal status: MET  LONG TERM GOALS: Target date: 02/24/2024  Pt will increase FOTO to at least 50 in order to demonstrate improvement in function related to mobility and QoL. Baseline: 44 7/29:48 9/10:40 12/24/2022= 51 Goal status: MET  2.  Pt (< 60 yrs old) will complete 5xSTS test < 30 seconds indicating an increase in LE strength and improved balance. Baseline: 10/01/22 34 sec (multiple initial attempts with posterior LOB in chair) 12/20: 49 seconds 11/04/22:1:06, min A on multiple attempts to maintain balance and pt stays in flexed posture throughout test when coming to standing.   9/10: 27.5 sec, Pushes WC post consistently, close CGA  10/22: 47 seconds first attempt 12/5: 1 minute, 37 seconds, multiple attempts to get first stand, but no difficulty after that; 03/20/2023= 1 min 2 sec with 1UE on walker and 1 on armrest- some posterior lean- Close CGA; 06/17/2023= 38 sec with 1 UE on walker and 1 on armrest; 08/21/2023= 35 sec with 1UE on walker and 1 on armrest.  8/5: 36 sec with 1 UE on walker 1 on arm rest; 12/23/2023= 37.05 sec with some  posterior LOB- CGA to min A to correct.  Goal status: ONGOING  3.  Pt will increase to > 0.5 m/s (30 sec) as to improve gait speed for better home ambulation and reduce fall risk. Baseline: 10/01/22 0.096 m/s with RW, close CGA, and WC follow previously 12/27: 0.11 m/s 11/04/22: .056 m/s  9/10: 076 m/s 10/22: 0.64 m/s  11/26: .076 m/s; 03/20/2023= 0.08 m/s; 04/22/2023= 0.089 m/s; 05/15/2023 = 0.079 m/s; 05/27/2023= 0.11 m/s; 07/17/2023= 0.15 m/s; 08/21/2023= 0.14 m/s avg with Bariatric walker; 09/09/2023= 0.14 m/s; 10/02/2023= 0.09 m/s with Bariatric walker and Increased cues to swing RLE through vs. Previous sesisons. 11/13/2023= 0.12 m/s with Bariatric walker; 12/11/2023= 0.14 m/s; 12/23/2023=0.135 m/s Goal status: ONGOING  4.  Pt will increase hip flexor and abductor  gross strength to 4+/5 bilat as to improve functional strength for independent gait, increased standing tolerance, and increased ADL ability. Baseline: 10/01/22 4+/5, exception hip flexors and abductors 4/5 each bilat  (previously 03/27/22: 4+/5 ) 9/10: Hip flexor 4/5, hip abductor 4+/5 10/22: hip flexor 4/5 hip abductor 4+/5 11/26: hip flexor 4+/5 hip abductor 4+/5 Goal status: MET  5.  Pt will ambulate 150 ft with BRW and WC follow with no rest breaks in less than 12 min to allow for improved mobility within home and increased independence. Baseline: 10/03/22 150' with BRW 04/03/22: 105 ft with BRW 7/29: 100 in 9:30 with pediatric RW 9/10:101 ft with BRW stooped posture worsened throughout test 12/24/2022- 150 feet using BRW, CGA with w/c follow- in 10 min 17 sec. Will keep goal to make sure he can do this consistently 10/22: 119 ft with BRW 8 min 23 seconds terminated by patient  11/26: 148 ft with BRW 14 min 34 seconds terminated by patient 03/20/2023= 152 feet using BRW, CGA and w/c follow in 14 min 3 sec- Need to keep goal active to ensure consistency but will revised and add a time element  to goal. 04/22/2023= 162 feet in 11 min 54  sec using bariatric RW, CGA and close CGA assist; 05/27/2023- 170 feet without rest break using bariatric RW. 06/17/2023= 160 feet with bariatric 4WW in 9 min- stopped due to fatigue.  Goal status: MET   6. Pt will decrease TUG to below 1 min 30 seconds/decrease in order to demonstrate decreased fall risk. Baseline: 06/17/2023= 2 min using bariatric 4WW; 09/09/2023= 1 min 50 sec with bariatric front wheeled walker. 12/02/2023= 1 min 44 sec; 12/23/2023= 2 min 8 sec avg with BRW Goal status: ONGOING  7. Pt will ambulate 300 ft with BRW, CGA and WC follow with no rest breaks in less than 20 min to allow for improved mobility within home/community and increased independence. Baseline: 06/17/2023= 160 feet with bariatric 4WW in 9 min- stopped due to fatigue. 07/17/2023- Patient able to walk 270 feet in > 20 min with BRW, CGA and close w/c follow today- VC for posture and for gait sequencing; 08/21/2023- Not specifically testing but patient has been walking 160 feet or more each Visit; 09/09/2023= Patient ambulated 180 feet overall at one time today using bariatric RW, CGA with VC for erect posture and for increased step length. 10/02/2023= 165 feet with Bariatric RW- close w/c follow and CGA. More diffiuclty with gait sequencing more consistent VC for erect posture. 12/02/2023- Paitent ambulated >100 feet in // bars today but will reassess using walker next visit. 12/11/2023= Patient ambulated 204 feet with BRW and Close CGA and w/c follow Goal Status: PROGRESSING  8. Patient will report ability to walk out his front door including stepping over threshold using his walker with min A of caregiver to enable him to be more independent with household mobility and be able to go out to porch with less dependence on caregivers. Baseline: Patient dependent on caregivers to push him in his wheelchair to go out to porch and reports dependent on them to always assist so he doesn't go outside like he would like to. Goal status:  NEW  ASSESSMENT :  CLINICAL IMPRESSION:   Patient presented with much improved functional mobility- able to stand with CGA to min A today with all sit to stands and able to walk in // bars with BUE Support multiple times with much improved step mobility. Patient reported feeling tired at end of session yet happy to be moving around better.   Pt will continue to benefit from skilled physical therapy intervention to address impairments, improve QOL, and attain therapy goals.     OBJECTIVE IMPAIRMENTS Abnormal gait, decreased activity tolerance, decreased balance, decreased coordination, decreased endurance, decreased mobility, difficulty walking, decreased ROM, decreased strength, improper body mechanics, postural dysfunction, and obesity.   ACTIVITY LIMITATIONS carrying, lifting, bending, sitting, standing, squatting, stairs, transfers, bed mobility, continence, bathing, toileting, dressing, hygiene/grooming, and locomotion level  PARTICIPATION LIMITATIONS: meal prep, cleaning, laundry, interpersonal relationship, driving, shopping, community activity, and yard work  PERSONAL FACTORS Fitness, Past/current experiences, Time since onset of injury/illness/exacerbation, and 3+ comorbidities: hx of brain tumor, past CVA, HTN, obesity, learning disability, DMII are also affecting patient's functional outcome.   REHAB POTENTIAL: Fair    CLINICAL DECISION MAKING: Stable/uncomplicated  EVALUATION COMPLEXITY: Low  PLAN: PT FREQUENCY: 1-2x/week  PT DURATION: 12 weeks  PLANNED INTERVENTIONS: Therapeutic exercises, Therapeutic activity, Neuromuscular re-education, Balance training, Gait training, Patient/Family education, Joint mobilization, Stair training, Vestibular training, Orthotic/Fit training, DME instructions, Dry Needling, Electrical stimulation, Wheelchair mobility training, Cryotherapy, Moist heat, Compression bandaging, Manual therapy, and Re-evaluation  PLAN  FOR NEXT SESSION:    -Bridging and review of some bed LE strengthening -Continue with functional strengthening exercises of the LE's  -Gait activities using RW. Incorporate proper technique with all transfers to decrease his risk of falling.   Reyes LOISE London PT  Physical Therapist- [place]  [facility]   10:07 PM 01/27/24    OUTPATIENT PHYSICAL THERAPY TREATMENT  Patient Name: Curtis Powell MRN: [id number] DOB:01/06/1966, 58 y.o., male Today's Date: 01/27/2024  PCP: Sherial Bail, MD  REFERRING PROVIDER: Sherial Bail, MD    PT End of Session - 01/27/24 1321     Visit Number 119    Number of Visits 130    Date for Recertification  02/24/24    Progress Note Due on Visit 120    PT Start Time 1316    PT Stop Time 1358    PT Time Calculation (min) 42 min    Equipment Utilized During Treatment Gait belt    Activity Tolerance Patient tolerated treatment well    Behavior During Therapy WFL for tasks assessed/performed                Past Medical History:  Diagnosis Date   Asthma    GERD (gastroesophageal reflux disease)    Hyperlipidemia    Hypertension    Obesity    Stroke [facility])    Past Surgical History:  Procedure Laterality Date   BRAIN SURGERY     Patient Active Problem List   Diagnosis Date Noted   Impaired glucose tolerance 03/23/2020   Learning disability 03/23/2020   Urinary and fecal incontinence 06/14/2019   Chronic pain syndrome 03/23/2019   Constipation 03/23/2019   Dysarthria 03/23/2019   Dysphagia 03/23/2019   Learning difficulty 03/23/2019   Morbid obesity (HCC) 03/23/2019   Muscle weakness 03/23/2019   Vitamin D deficiency 03/23/2019   Bilateral carotid artery stenosis 12/15/2018   Moderate aortic valve stenosis 12/15/2018   BMI 45.0-49.9, adult (HCC) 11/10/2018   Cerebrovascular accident (HCC) 05/02/2016   Hemiparesis affecting right side as late effect of cerebrovascular accident (HCC) 05/01/2016   Brain  tumor (HCC) 11/24/2014   Gastro-esophageal reflux disease without esophagitis 11/24/2014   Accident due to mechanical fall without injury 08/03/2014   Essential hypertension 06/13/2014   Mixed hyperlipidemia 06/13/2014   Unspecified sequelae of cerebral infarction 06/13/2014   Thyromegaly 01/07/2014   Type 2 diabetes mellitus without complication 01/06/2014   Neck mass 09/08/2013   Swimmer's ear 09/08/2013   Erectile dysfunction 01/03/2013   Prediabetes 01/03/2013   ONSET DATE: 05/09/2016  REFERRING DIAG: I69.351 (ICD-10-CM) - Hemiplegia and hemiparesis following cerebral infarction affecting right dominant side   THERAPY DIAG:  Unsteadiness on feet  Difficulty in walking, not elsewhere classified  Muscle weakness (generalized)  Abnormality of gait and mobility  Left-sided weakness  Repeated falls  Other abnormalities of gait and mobility  Hemiplegia and hemiparesis following cerebral infarction affecting right dominant side (HCC)  Other lack of coordination   Rationale for Evaluation and Treatment Rehabilitation  SUBJECTIVE:  SUBJECTIVE STATEMENT  Pt  reports doing well overall and no pain today.     Pt accompanied by: aunt  PERTINENT HISTORY:  The pt is a pleasant 58 yo male who returns to PT for evaluation of deficits s/o old CVA. Pt with hx of CVA Feb 2018. He was previously fully independent, but CVA caused significant deficits, mainly affecting his right side. Pt reports continued difficulty with walking and transfers. Pt still using WC for majority of mobility, otherwise he uses his RW. Pt lives with his mom who is his main caregiver. Patient had outpatient PT previously in clinic in 2023. He reports since discharge he has been completing his HEP and walking about every day. Pt  would like to improve his walking mainly, but also would like to work on balance. He also reports difficulty with transfers. Pt's mom and his caregiver helps him with ADLs. However, he says he is now waiting for a new caregiver. Prior to this pt had caregiver 3x/week. PMH significant for hx of brain tumor, past CVA, DMII and HTN. See chart for additional information.  PAIN:  Are you having pain? Pt reports no pain currently  PRECAUTIONS: fall  WEIGHT BEARING RESTRICTIONS none  PLOF: required assistance with ADLs (old CVA), prior to CVA in 2018 was indep.  History of Falls: Pt reports no recent falls  Home information: Pt lives with mother who is his primary caregiver. Previously had home aide, 3x/week but reports currently waiting for new one.  PATIENT GOALS: Pt would like to improve his ability to ambulate.  OBJECTIVE:  taken at [facility] unless otherwise noted:   DIAGNOSTIC FINDINGS: No recent imaging per chart  Objective measures taken at eval unless otherwise specified:   Posture: forward head posture, rounded shoulders  MMT LE: grossly  4+/5, exception hip flexors and abductors 4/5 each bilat   Functional tests:  5xSTS:  34 sec, use of BUE (multiple initial attempts with posterior LOB in chair)   : 0.096 m/s with RW, close CGA, and WC follow  Gait for distance (goal 150 ft): deferred due to time  Gait mechanics: impaired, decreased speed (see ), crouched posture, poor postural stability and relies heavily on BUE support on RW. Pt now with bilat AFOS   FOTO: 44 (goal 50)   TODAY'S TREATMENT 01/27/24    TherAct- To improve functional movements patterns for everyday tasks   Sit to stand with 1 arm on armrest and 1 on rail of steps 2 x 15 reps (increased time to perform)   Static standing in front of steps alternating - 30 sec No UE and 30 sec BUE Support and repeat x  5 min.  Step tap alt LE onto 1st step with BUE support 2 x 15 (increased    Seated scap rows  with GTB- 2x 15 reps  Seated horizontal shoulder abd GTB- 2 x 15 reps     PATIENT EDUCATION: Education details: If pt can lean pelvis into support surface, his energy efficicency will be higher.  Person educated: Patient Education method: Explanation, Demonstration, Tactile cues, and Verbal cues Education comprehension: verbalized understanding, returned demonstration, and verbal cues required  HOME EXERCISE PROGRAM:  Standing hip flexor stretch at counter, prolonged standing with erect posture for gluteal activation, HS curls with RTB  Access Code: [email or web address] URL: [email or web address] Date: 05/15/2023 Prepared by: Connell Kiss  Exercises - Seated Long Arc Quad with Ankle Weight  - 1 x daily - 7 x weekly - 2 sets - 10 reps  GOALS: Goals reviewed with patient? No  SHORT TERM GOALS: Target date: 11/12/2022  Pt will be independent with HEP in order to improve strength and balance in order to decrease fall risk and improve function at home. Baseline: instructed in HEP today, to provide printout next visit Goal status: MET  LONG TERM GOALS: Target date: 02/24/2024  Pt will increase FOTO to at least 50 in order to demonstrate improvement in function related to mobility and QoL. Baseline: 44 7/29:48 9/10:40 12/24/2022= 51 Goal status: MET  2.  Pt (< 60 yrs old) will complete 5xSTS test < 30 seconds indicating an increase in LE strength and improved balance. Baseline: 10/01/22 34 sec (multiple initial attempts with posterior LOB in chair) 12/20: 49 seconds 11/04/22:1:06, min A on multiple attempts to maintain balance and pt stays in flexed posture throughout test when coming to standing.   9/10: 27.5 sec, Pushes WC post consistently, close CGA  10/22: 47 seconds first attempt 12/5: 1 minute, 37 seconds, multiple attempts to get first stand, but no difficulty after that; 03/20/2023= 1 min 2 sec with 1UE on walker and 1 on armrest- some posterior lean- Close CGA; 06/17/2023=  38 sec with 1 UE on walker and 1 on armrest; 08/21/2023= 35 sec with 1UE on walker and 1 on armrest.  8/5: 36 sec with 1 UE on walker 1 on arm rest  Goal status: ONGOING  3.  Pt will increase to > 0.5 m/s (30 sec) as to improve gait speed for better home ambulation and reduce fall risk. Baseline: 10/01/22 0.096 m/s with RW, close CGA, and WC follow previously 12/27: 0.11 m/s 11/04/22: .056 m/s  9/10: 076 m/s 10/22: 0.64 m/s  11/26: .076 m/s; 03/20/2023= 0.08 m/s; 04/22/2023= 0.089 m/s; 05/15/2023 = 0.079 m/s; 05/27/2023= 0.11 m/s; 07/17/2023= 0.15 m/s; 08/21/2023= 0.14 m/s avg with Bariatric walker; 09/09/2023= 0.14 m/s; 10/02/2023= 0.09 m/s with Bariatric walker and Increased cues to swing RLE through vs. Previous sesisons. 11/13/2023= 0.12 m/s with Bariatric walker; 12/11/2023= 0.14 m/s Goal status: ONGOING  4.  Pt will increase hip flexor and abductor  gross strength to 4+/5 bilat as to improve functional strength for independent gait, increased standing tolerance, and increased ADL ability. Baseline: 10/01/22 4+/5, exception hip flexors and abductors 4/5 each bilat  (previously 03/27/22: 4+/5 ) 9/10: Hip flexor 4/5, hip abductor 4+/5 10/22: hip flexor 4/5 hip abductor 4+/5 11/26: hip flexor 4+/5 hip abductor 4+/5 Goal status: MET  5.  Pt will ambulate 150 ft with BRW and WC follow with no rest breaks in less than 12 min to allow for improved mobility within home and increased independence. Baseline: 10/03/22 150' with BRW 04/03/22: 105 ft with BRW 7/29: 100 in 9:30 with pediatric RW 9/10:101 ft with BRW stooped posture worsened throughout test 12/24/2022- 150 feet using BRW, CGA with w/c follow- in 10 min 17 sec. Will keep goal to make sure he can do this consistently 10/22: 119 ft with BRW 8 min 23 seconds terminated by patient  11/26: 148 ft with BRW 14 min 34 seconds terminated by patient 03/20/2023= 152 feet using BRW, CGA and w/c follow in 14 min 3 sec- Need to keep goal active to ensure  consistency but will revised and add a time element to goal. 04/22/2023= 162 feet in 11 min 54 sec using bariatric RW, CGA and close CGA assist; 05/27/2023- 170 feet without rest break using bariatric RW. 06/17/2023= 160 feet with bariatric 4WW in 9 min- stopped due to fatigue.  Goal status: MET   6. Pt will decrease TUG to below 1 min 30 seconds/decrease in order to demonstrate decreased fall risk. Baseline: 06/17/2023= 2 min using bariatric 4WW; 09/09/2023= 1 min 50 sec with bariatric front wheeled walker. 12/02/2023= 1 min 44 sec  Goal status: PROGRESSING  7. Pt will ambulate 300 ft with BRW, CGA and WC follow with no rest breaks in less than 20 min to allow for improved mobility within home/community and increased independence. Baseline: 06/17/2023= 160 feet with bariatric 4WW in 9 min- stopped due to fatigue. 07/17/2023- Patient able to walk 270 feet in > 20 min with BRW, CGA and close w/c follow today- VC for posture and for gait sequencing; 08/21/2023- Not specifically testing but patient has been walking 160 feet or more each Visit; 09/09/2023= Patient ambulated 180 feet overall at one time today using bariatric RW, CGA with VC for erect posture and for increased step length. 10/02/2023= 165 feet with Bariatric RW- close w/c follow and CGA. More diffiuclty with gait sequencing more consistent VC for erect posture. 12/02/2023- Patient ambulated >100 feet in // bars today but will reassess using walker next visit. 12/11/2023= Patient ambulated 204 feet with BRW and Close CGA and w/c follow Goal Status: PROGRESSING  8. Patient will report ability to walk out his front door including stepping over threshold using his walker with min A of caregiver to enable him to be more independent with household mobility and be able to go out to porch with less dependence on caregivers. Baseline: Patient dependent on caregivers to push him in his wheelchair to go out to porch and reports dependent on them to always assist so he  doesn't go outside like he would like to. Goal status: NEW  ASSESSMENT :  CLINICAL IMPRESSION:    Patient performed well with standing activities today - including ability to stand without UE support with less forward trunk lean. He was able to lift his LE better overall today as well with step taps. Pt will continue to benefit from skilled therapy to address remaining deficits in order to improve overall QoL and return to PLOF.     OBJECTIVE IMPAIRMENTS Abnormal gait, decreased activity tolerance, decreased balance, decreased coordination, decreased endurance, decreased mobility, difficulty walking, decreased ROM, decreased strength, improper body mechanics, postural dysfunction, and obesity.   ACTIVITY LIMITATIONS carrying, lifting, bending, sitting, standing, squatting, stairs, transfers, bed mobility, continence, bathing, toileting, dressing, hygiene/grooming, and locomotion level  PARTICIPATION LIMITATIONS: meal prep, cleaning, laundry, interpersonal relationship, driving, shopping, community activity, and yard work  PERSONAL FACTORS Fitness, Past/current experiences, Time since onset of injury/illness/exacerbation, and 3+ comorbidities: hx of brain tumor, past CVA, HTN, obesity, learning disability, DMII are also affecting patient's functional outcome.   REHAB POTENTIAL: Fair    CLINICAL DECISION MAKING: Stable/uncomplicated  EVALUATION COMPLEXITY: Low  PLAN: PT FREQUENCY: 1-2x/week  PT DURATION: 12 weeks  PLANNED INTERVENTIONS: Therapeutic exercises, Therapeutic activity, Neuromuscular re-education, Balance training, Gait training, Patient/Family education, Joint mobilization, Stair training, Vestibular training, Orthotic/Fit training, DME instructions, Dry Needling, Electrical stimulation, Wheelchair mobility training, Cryotherapy, Moist heat, Compression bandaging, Manual therapy, and Re-evaluation  PLAN FOR NEXT SESSION:   -PROGRESS REPORT NEXT VISIT -Continue with  functional strengthening exercises of the LE's  -Gait activities using RW. Incorporate proper technique with all transfers to decrease his risk of falling.    Reyes LOISE London PT  Physical Therapist- [facility]  10:07 PM, 01/27/24

## 2024-01-29 ENCOUNTER — Ambulatory Visit

## 2024-01-29 DIAGNOSIS — R278 Other lack of coordination: Secondary | ICD-10-CM

## 2024-01-29 DIAGNOSIS — R296 Repeated falls: Secondary | ICD-10-CM

## 2024-01-29 DIAGNOSIS — I639 Cerebral infarction, unspecified: Secondary | ICD-10-CM

## 2024-01-29 DIAGNOSIS — R531 Weakness: Secondary | ICD-10-CM

## 2024-01-29 DIAGNOSIS — I69351 Hemiplegia and hemiparesis following cerebral infarction affecting right dominant side: Secondary | ICD-10-CM

## 2024-01-29 DIAGNOSIS — R262 Difficulty in walking, not elsewhere classified: Secondary | ICD-10-CM | POA: Diagnosis not present

## 2024-01-29 DIAGNOSIS — R2689 Other abnormalities of gait and mobility: Secondary | ICD-10-CM

## 2024-01-29 DIAGNOSIS — M6281 Muscle weakness (generalized): Secondary | ICD-10-CM

## 2024-01-29 DIAGNOSIS — R2681 Unsteadiness on feet: Secondary | ICD-10-CM

## 2024-01-29 DIAGNOSIS — R269 Unspecified abnormalities of gait and mobility: Secondary | ICD-10-CM

## 2024-01-29 NOTE — Therapy (Signed)
 OUTPATIENT PHYSICAL THERAPY TREATMENT/PHYSICAL THERAPY PROGRESS NOTE   Dates of reporting period  12/23/23   to   01/29/24  Patient Name: Curtis Powell MRN: [id number] DOB:06-24-1965, 58 y.o., male Today's Date: 01/29/2024  PCP: Sherial Bail, MD  REFERRING PROVIDER: Sherial Bail, MD    PT End of Session - 01/29/24 1321     Visit Number 120    Number of Visits 130    Date for Recertification  02/24/24    Progress Note Due on Visit 120    PT Start Time 1320    PT Stop Time 1400    PT Time Calculation (min) 40 min    Equipment Utilized During Treatment Gait belt    Activity Tolerance Patient tolerated treatment well    Behavior During Therapy WFL for tasks assessed/performed         Past Medical History:  Diagnosis Date   Asthma    GERD (gastroesophageal reflux disease)    Hyperlipidemia    Hypertension    Obesity    Stroke [facility])    Past Surgical History:  Procedure Laterality Date   BRAIN SURGERY     Patient Active Problem List   Diagnosis Date Noted   Impaired glucose tolerance 03/23/2020   Learning disability 03/23/2020   Urinary and fecal incontinence 06/14/2019   Chronic pain syndrome 03/23/2019   Constipation 03/23/2019   Dysarthria 03/23/2019   Dysphagia 03/23/2019   Learning difficulty 03/23/2019   Morbid obesity (HCC) 03/23/2019   Muscle weakness 03/23/2019   Vitamin D deficiency 03/23/2019   Bilateral carotid artery stenosis 12/15/2018   Moderate aortic valve stenosis 12/15/2018   BMI 45.0-49.9, adult (HCC) 11/10/2018   Cerebrovascular accident (HCC) 05/02/2016   Hemiparesis affecting right side as late effect of cerebrovascular accident (HCC) 05/01/2016   Brain tumor (HCC) 11/24/2014   Gastro-esophageal reflux disease without esophagitis 11/24/2014   Accident due to mechanical fall without injury 08/03/2014   Essential hypertension 06/13/2014   Mixed hyperlipidemia 06/13/2014   Unspecified sequelae of cerebral infarction  06/13/2014   Thyromegaly 01/07/2014   Type 2 diabetes mellitus without complication 01/06/2014   Neck mass 09/08/2013   Swimmer's ear 09/08/2013   Erectile dysfunction 01/03/2013   Prediabetes 01/03/2013   ONSET DATE: 05/09/2016  REFERRING DIAG: I69.351 (ICD-10-CM) - Hemiplegia and hemiparesis following cerebral infarction affecting right dominant side   THERAPY DIAG:  Unsteadiness on feet  Difficulty in walking, not elsewhere classified  Muscle weakness (generalized)  Abnormality of gait and mobility  Left-sided weakness  Repeated falls  Other abnormalities of gait and mobility  Hemiplegia and hemiparesis following cerebral infarction affecting right dominant side (HCC)  Other lack of coordination  Acute CVA (cerebrovascular accident) (HCC)   Rationale for Evaluation and Treatment Rehabilitation  SUBJECTIVE:  SUBJECTIVE STATEMENT  Pt reports that he's doing well.  Pt has no primary complaints at this time.    Pt accompanied by: aunt  PERTINENT HISTORY:  The pt is a pleasant 58 yo male who returns to PT for evaluation of deficits s/o old CVA. Pt with hx of CVA Feb 2018. He was previously fully independent, but CVA caused significant deficits, mainly affecting his right side. Pt reports continued difficulty with walking and transfers. Pt still using WC for majority of mobility, otherwise he uses his RW. Pt lives with his mom who is his main caregiver. Patient had outpatient PT previously in clinic in 2023. He reports since discharge he has been completing his HEP and walking about every day. Pt would like to improve his walking mainly, but also would like to work on balance. He also reports difficulty with transfers. Pt's mom and his caregiver helps him with ADLs. However, he says he is now  waiting for a new caregiver. Prior to this pt had caregiver 3x/week. PMH significant for hx of brain tumor, past CVA, DMII and HTN. See chart for additional information.  PAIN:  Are you having pain? Pt reports no pain currently  PRECAUTIONS: fall  WEIGHT BEARING RESTRICTIONS none  PLOF: required assistance with ADLs (old CVA), prior to CVA in 2018 was indep.  History of Falls: Pt reports no recent falls  Home information: Pt lives with mother who is his primary caregiver. Previously had home aide, 3x/week but reports currently waiting for new one.  PATIENT GOALS: Pt would like to improve his ability to ambulate.  OBJECTIVE:  taken at [facility] unless otherwise noted:   DIAGNOSTIC FINDINGS: No recent imaging per chart  Objective measures taken at eval unless otherwise specified:   Posture: forward head posture, rounded shoulders  MMT LE: grossly  4+/5, exception hip flexors and abductors 4/5 each bilat   Functional tests:  5xSTS:  34 sec, use of BUE (multiple initial attempts with posterior LOB in chair)   : 0.096 m/s with RW, close CGA, and WC follow  Gait for distance (goal 150 ft): deferred due to time  Gait mechanics: impaired, decreased speed (see ), crouched posture, poor postural stability and relies heavily on BUE support on RW. Pt now with bilat AFOS   FOTO: 44 (goal 50)   TODAY'S TREATMENT : 01/29/24   Physical Performance Testing:  Five times Sit to Stand Test (FTSS)  TIME: 54.77 sec with modA to come upright and use of BRW to stand from wheelchair  Cut off scores indicative of increased fall risk: >12 sec CVA, >16 sec PD, >13 sec vestibular (ANPTA Core Set of Outcome Measures for Adults with Neurologic Conditions, 2018)  10 Meter Walk Test: Patient instructed to walk 10 meters (32.8 ft) as quickly and as safely as possible at their normal speed Results: 3 min 25 sec (0.05 m/s with CGA)  Cut off scores:   Household Ambulator  < 0.4 m/s   Limited Community Ambulator  0.4 - 0.8 m/s  [employer]  > 0.8 m/s  Increased fall risk  < 1.51m/s  Crossing a Street  >1.25m/s  MCID 0.05 m/s (small), 0.13 m/s (moderate), 0.06 m/s (significant)  (ANPTA Core Set of Outcome Measures for Adults with Neurologic Conditions, 2018)    TherAct: To improve functional movements patterns for everyday tasks  Ambulation for overall distance while being timed for goal.  Pt able to achieve ambulation for 111 feet with BRW and Close CGA and w/c  follow (10 min 59 sec) before needing to rest.      PATIENT EDUCATION: Education details: If pt can lean pelvis into support surface, his energy efficicency will be higher.  Person educated: Patient Education method: Explanation, Demonstration, Tactile cues, and Verbal cues Education comprehension: verbalized understanding, returned demonstration, and verbal cues required  HOME EXERCISE PROGRAM:  Standing hip flexor stretch at counter, prolonged standing with erect posture for gluteal activation, HS curls with RTB  Access Code: [email or web address] URL: [email or web address] Date: 05/15/2023 Prepared by: Connell Kiss  Exercises - Seated Long Arc Quad with Ankle Weight  - 1 x daily - 7 x weekly - 2 sets - 10 reps  GOALS: Goals reviewed with patient? No  SHORT TERM GOALS: Target date: 11/12/2022  Pt will be independent with HEP in order to improve strength and balance in order to decrease fall risk and improve function at home. Baseline: instructed in HEP today, to provide printout next visit Goal status: MET  LONG TERM GOALS: Target date: 02/24/2024  Pt will increase FOTO to at least 50 in order to demonstrate improvement in function related to mobility and QoL. Baseline: 44 7/29:48 9/10:40 12/24/2022= 51 Goal status: MET  2.  Pt (< 60 yrs old) will complete 5xSTS test < 30 seconds indicating an increase in LE strength and improved balance. Baseline: 10/01/22 34 sec (multiple initial  attempts with posterior LOB in chair) 12/20: 49 seconds 11/04/22:1:06, min A on multiple attempts to maintain balance and pt stays in flexed posture throughout test when coming to standing.   9/10: 27.5 sec, Pushes WC post consistently, close CGA  10/22: 47 seconds first attempt 12/5: 1 minute, 37 seconds, multiple attempts to get first stand, but no difficulty after that; 03/20/2023= 1 min 2 sec with 1UE on walker and 1 on armrest- some posterior lean- Close CGA; 06/17/2023= 38 sec with 1 UE on walker and 1 on armrest; 08/21/2023= 35 sec with 1UE on walker and 1 on armrest.  8/5: 36 sec with 1 UE on walker 1 on arm rest; 12/23/2023= 37.05 sec with some posterior LOB- CGA to min A to correct.  01/29/24: 54.77 sec with significant posterior LOB and needing modA to come upright into standing Goal status: ONGOING  3.  Pt will increase to > 0.5 m/s (30 sec) as to improve gait speed for better home ambulation and reduce fall risk. Baseline: 10/01/22 0.096 m/s with RW, close CGA, and WC follow previously 12/27: 0.11 m/s 11/04/22: .056 m/s  9/10: 076 m/s 10/22: 0.64 m/s  11/26: .076 m/s; 03/20/2023= 0.08 m/s; 04/22/2023= 0.089 m/s; 05/15/2023 = 0.079 m/s; 05/27/2023= 0.11 m/s; 07/17/2023= 0.15 m/s; 08/21/2023= 0.14 m/s avg with Bariatric walker; 09/09/2023= 0.14 m/s; 10/02/2023= 0.09 m/s with Bariatric walker and Increased cues to swing RLE through vs. Previous sesisons. 11/13/2023= 0.12 m/s with Bariatric walker; 12/11/2023= 0.14 m/s; 12/23/2023=0.135 m/s 01/29/24: 3 min 25 sec, 0.05 m/s Goal status: NOT PROGRESSING  4.  Pt will increase hip flexor and abductor  gross strength to 4+/5 bilat as to improve functional strength for independent gait, increased standing tolerance, and increased ADL ability. Baseline: 10/01/22 4+/5, exception hip flexors and abductors 4/5 each bilat  (previously 03/27/22: 4+/5 ) 9/10: Hip flexor 4/5, hip abductor 4+/5 10/22: hip flexor 4/5 hip abductor 4+/5 11/26: hip flexor 4+/5 hip  abductor 4+/5 Goal status: MET  5.  Pt will ambulate 150 ft with BRW and WC follow with no rest breaks in less than 12 min to allow for  improved mobility within home and increased independence. Baseline: 10/03/22 150' with BRW 04/03/22: 105 ft with BRW 7/29: 100 in 9:30 with pediatric RW 9/10:101 ft with BRW stooped posture worsened throughout test 12/24/2022- 150 feet using BRW, CGA with w/c follow- in 10 min 17 sec. Will keep goal to make sure he can do this consistently 10/22: 119 ft with BRW 8 min 23 seconds terminated by patient  11/26: 148 ft with BRW 14 min 34 seconds terminated by patient 03/20/2023= 152 feet using BRW, CGA and w/c follow in 14 min 3 sec- Need to keep goal active to ensure consistency but will revised and add a time element to goal. 04/22/2023= 162 feet in 11 min 54 sec using bariatric RW, CGA and close CGA assist; 05/27/2023- 170 feet without rest break using bariatric RW. 06/17/2023= 160 feet with bariatric 4WW in 9 min- stopped due to fatigue.  Goal status: MET   6. Pt will decrease TUG to below 1 min 30 seconds/decrease in order to demonstrate decreased fall risk. Baseline: 06/17/2023= 2 min using bariatric 4WW; 09/09/2023= 1 min 50 sec with bariatric front wheeled walker.  12/02/2023= 1 min 44 sec;  12/23/2023= 2 min 8 sec avg with BRW 01/29/24: 3 min 42 sec with BRW Goal status: ONGOING  7. Pt will ambulate 300 ft with BRW, CGA and WC follow with no rest breaks in less than 20 min to allow for improved mobility within home/community and increased independence. Baseline: 06/17/2023= 160 feet with bariatric 4WW in 9 min- stopped due to fatigue. 07/17/2023- Patient able to walk 270 feet in > 20 min with BRW, CGA and close w/c follow today- VC for posture and for gait sequencing; 08/21/2023- Not specifically testing but patient has been walking 160 feet or more each Visit; 09/09/2023= Patient ambulated 180 feet overall at one time today using bariatric RW, CGA with VC for erect  posture and for increased step length.  10/02/2023= 165 feet with Bariatric RW- close w/c follow and CGA. More diffiuclty with gait sequencing more consistent VC for erect posture.  12/02/2023- Paitent ambulated >100 feet in // bars today but will reassess using walker next visit.  12/11/2023= Patient ambulated 204 feet with BRW and Close CGA and w/c follow 01/29/24: Patient ambulated 111 feet with BRW and Close CGA and w/c follow (10 min 59 sec) before needing to rest Goal Status: PROGRESSING  8. Patient will report ability to walk out his front door including stepping over threshold using his walker with min A of caregiver to enable him to be more independent with household mobility and be able to go out to porch with less dependence on caregivers. Baseline: Patient dependent on caregivers to push him in his wheelchair to go out to porch and reports dependent on them to always assist so he doesn't go outside like he would like to. 01/29/24: Pt reports he has not been able to attempt this due to not having a male at home to assist him. Goal status: NEW  ASSESSMENT :  CLINICAL IMPRESSION:    Pt did not perform well with the goal assessment portion of the visit, however pt is known to have days where pt does not perform well.  Pt struggled at times to come upright and needed more assistance to come into standing today.  Pt unsure as to the reason, however was still willing to push through the goal assessment/tests.  Patient's condition has the potential to improve in response to therapy. Maximum improvement is yet  to be obtained. The anticipated improvement is attainable and reasonable in a generally predictable time.   Pt will continue to benefit from skilled therapy to address remaining deficits in order to improve overall QoL and return to PLOF.       OBJECTIVE IMPAIRMENTS Abnormal gait, decreased activity tolerance, decreased balance, decreased coordination, decreased endurance, decreased  mobility, difficulty walking, decreased ROM, decreased strength, improper body mechanics, postural dysfunction, and obesity.   ACTIVITY LIMITATIONS carrying, lifting, bending, sitting, standing, squatting, stairs, transfers, bed mobility, continence, bathing, toileting, dressing, hygiene/grooming, and locomotion level  PARTICIPATION LIMITATIONS: meal prep, cleaning, laundry, interpersonal relationship, driving, shopping, community activity, and yard work  PERSONAL FACTORS Fitness, Past/current experiences, Time since onset of injury/illness/exacerbation, and 3+ comorbidities: hx of brain tumor, past CVA, HTN, obesity, learning disability, DMII are also affecting patient's functional outcome.   REHAB POTENTIAL: Fair    CLINICAL DECISION MAKING: Stable/uncomplicated  EVALUATION COMPLEXITY: Low  PLAN: PT FREQUENCY: 1-2x/week  PT DURATION: 12 weeks  PLANNED INTERVENTIONS: Therapeutic exercises, Therapeutic activity, Neuromuscular re-education, Balance training, Gait training, Patient/Family education, Joint mobilization, Stair training, Vestibular training, Orthotic/Fit training, DME instructions, Dry Needling, Electrical stimulation, Wheelchair mobility training, Cryotherapy, Moist heat, Compression bandaging, Manual therapy, and Re-evaluation  PLAN FOR NEXT SESSION:   -Bridging and review of some bed LE strengthening -Continue with functional strengthening exercises of the LE's  -Gait activities using RW. Incorporate proper technique with all transfers to decrease his risk of falling.   Fonda LELON Simpers PT  Physical Therapist- [place]  [facility]   3:56 PM 01/29/24    OUTPATIENT PHYSICAL THERAPY TREATMENT  Patient Name: Curtis Powell MRN: [id number] DOB:17-Mar-1966, 58 y.o., male Today's Date: 01/29/2024  PCP: Sherial Bail, MD  REFERRING PROVIDER: Sherial Bail, MD    PT End of Session - 01/29/24 1321     Visit Number 120    Number of  Visits 130    Date for Recertification  02/24/24    Progress Note Due on Visit 120    PT Start Time 1320    PT Stop Time 1400    PT Time Calculation (min) 40 min    Equipment Utilized During Treatment Gait belt    Activity Tolerance Patient tolerated treatment well    Behavior During Therapy WFL for tasks assessed/performed                Past Medical History:  Diagnosis Date   Asthma    GERD (gastroesophageal reflux disease)    Hyperlipidemia    Hypertension    Obesity    Stroke [facility])    Past Surgical History:  Procedure Laterality Date   BRAIN SURGERY     Patient Active Problem List   Diagnosis Date Noted   Impaired glucose tolerance 03/23/2020   Learning disability 03/23/2020   Urinary and fecal incontinence 06/14/2019   Chronic pain syndrome 03/23/2019   Constipation 03/23/2019   Dysarthria 03/23/2019   Dysphagia 03/23/2019   Learning difficulty 03/23/2019   Morbid obesity (HCC) 03/23/2019   Muscle weakness 03/23/2019   Vitamin D deficiency 03/23/2019   Bilateral carotid artery stenosis 12/15/2018   Moderate aortic valve stenosis 12/15/2018   BMI 45.0-49.9, adult (HCC) 11/10/2018   Cerebrovascular accident (HCC) 05/02/2016   Hemiparesis affecting right side as late effect of cerebrovascular accident (HCC) 05/01/2016   Brain tumor (HCC) 11/24/2014   Gastro-esophageal reflux disease without esophagitis 11/24/2014   Accident due to mechanical fall without injury 08/03/2014   Essential hypertension 06/13/2014  Mixed hyperlipidemia 06/13/2014   Unspecified sequelae of cerebral infarction 06/13/2014   Thyromegaly 01/07/2014   Type 2 diabetes mellitus without complication 01/06/2014   Neck mass 09/08/2013   Swimmer's ear 09/08/2013   Erectile dysfunction 01/03/2013   Prediabetes 01/03/2013   ONSET DATE: 05/09/2016  REFERRING DIAG: P30.648 (ICD-10-CM) - Hemiplegia and hemiparesis following cerebral infarction affecting right dominant side   THERAPY  DIAG:  Unsteadiness on feet  Difficulty in walking, not elsewhere classified  Muscle weakness (generalized)  Abnormality of gait and mobility  Left-sided weakness  Repeated falls  Other abnormalities of gait and mobility  Hemiplegia and hemiparesis following cerebral infarction affecting right dominant side (HCC)  Other lack of coordination  Acute CVA (cerebrovascular accident) (HCC)   Rationale for Evaluation and Treatment Rehabilitation  SUBJECTIVE:                                                                                                                                                                                             SUBJECTIVE STATEMENT  Pt  reports doing well overall and no pain today.     Pt accompanied by: aunt  PERTINENT HISTORY:  The pt is a pleasant 58 yo male who returns to PT for evaluation of deficits s/o old CVA. Pt with hx of CVA Feb 2018. He was previously fully independent, but CVA caused significant deficits, mainly affecting his right side. Pt reports continued difficulty with walking and transfers. Pt still using WC for majority of mobility, otherwise he uses his RW. Pt lives with his mom who is his main caregiver. Patient had outpatient PT previously in clinic in 2023. He reports since discharge he has been completing his HEP and walking about every day. Pt would like to improve his walking mainly, but also would like to work on balance. He also reports difficulty with transfers. Pt's mom and his caregiver helps him with ADLs. However, he says he is now waiting for a new caregiver. Prior to this pt had caregiver 3x/week. PMH significant for hx of brain tumor, past CVA, DMII and HTN. See chart for additional information.  PAIN:  Are you having pain? Pt reports no pain currently  PRECAUTIONS: fall  WEIGHT BEARING RESTRICTIONS none  PLOF: required assistance with ADLs (old CVA), prior to CVA in 2018 was indep.  History of Falls: Pt reports  no recent falls  Home information: Pt lives with mother who is his primary caregiver. Previously had home aide, 3x/week but reports currently waiting for new one.  PATIENT GOALS: Pt would like to improve his ability to ambulate.  OBJECTIVE:  taken at [facility] unless otherwise  noted:   DIAGNOSTIC FINDINGS: No recent imaging per chart  Objective measures taken at eval unless otherwise specified:   Posture: forward head posture, rounded shoulders  MMT LE: grossly  4+/5, exception hip flexors and abductors 4/5 each bilat   Functional tests:  5xSTS:  34 sec, use of BUE (multiple initial attempts with posterior LOB in chair)   : 0.096 m/s with RW, close CGA, and WC follow  Gait for distance (goal 150 ft): deferred due to time  Gait mechanics: impaired, decreased speed (see ), crouched posture, poor postural stability and relies heavily on BUE support on RW. Pt now with bilat AFOS   FOTO: 44 (goal 50)   TODAY'S TREATMENT 01/27/24    TherAct- To improve functional movements patterns for everyday tasks   Sit to stand with 1 arm on armrest and 1 on rail of steps 2 x 15 reps (increased time to perform)   Static standing in front of steps alternating - 30 sec No UE and 30 sec BUE Support and repeat x  5 min.  Step tap alt LE onto 1st step with BUE support 2 x 15 (increased    Seated scap rows with GTB- 2x 15 reps  Seated horizontal shoulder abd GTB- 2 x 15 reps     PATIENT EDUCATION: Education details: If pt can lean pelvis into support surface, his energy efficicency will be higher.  Person educated: Patient Education method: Explanation, Demonstration, Tactile cues, and Verbal cues Education comprehension: verbalized understanding, returned demonstration, and verbal cues required  HOME EXERCISE PROGRAM:  Standing hip flexor stretch at counter, prolonged standing with erect posture for gluteal activation, HS curls with RTB  Access Code: [email or web address] URL:  [email or web address] Date: 05/15/2023 Prepared by: Connell Kiss  Exercises - Seated Long Arc Quad with Ankle Weight  - 1 x daily - 7 x weekly - 2 sets - 10 reps  GOALS: Goals reviewed with patient? No  SHORT TERM GOALS: Target date: 11/12/2022  Pt will be independent with HEP in order to improve strength and balance in order to decrease fall risk and improve function at home. Baseline: instructed in HEP today, to provide printout next visit Goal status: MET  LONG TERM GOALS: Target date: 02/24/2024  Pt will increase FOTO to at least 50 in order to demonstrate improvement in function related to mobility and QoL. Baseline: 44 7/29:48 9/10:40 12/24/2022= 51 Goal status: MET  2.  Pt (< 60 yrs old) will complete 5xSTS test < 30 seconds indicating an increase in LE strength and improved balance. Baseline: 10/01/22 34 sec (multiple initial attempts with posterior LOB in chair) 12/20: 49 seconds 11/04/22:1:06, min A on multiple attempts to maintain balance and pt stays in flexed posture throughout test when coming to standing.   9/10: 27.5 sec, Pushes WC post consistently, close CGA  10/22: 47 seconds first attempt 12/5: 1 minute, 37 seconds, multiple attempts to get first stand, but no difficulty after that; 03/20/2023= 1 min 2 sec with 1UE on walker and 1 on armrest- some posterior lean- Close CGA; 06/17/2023= 38 sec with 1 UE on walker and 1 on armrest; 08/21/2023= 35 sec with 1UE on walker and 1 on armrest.  8/5: 36 sec with 1 UE on walker 1 on arm rest  Goal status: ONGOING  3.  Pt will increase to > 0.5 m/s (30 sec) as to improve gait speed for better home ambulation and reduce fall risk. Baseline: 10/01/22 0.096 m/s with RW, close CGA, and WC  follow previously 12/27: 0.11 m/s 11/04/22: .056 m/s  9/10: 076 m/s 10/22: 0.64 m/s  11/26: .076 m/s; 03/20/2023= 0.08 m/s; 04/22/2023= 0.089 m/s; 05/15/2023 = 0.079 m/s; 05/27/2023= 0.11 m/s; 07/17/2023= 0.15 m/s; 08/21/2023= 0.14 m/s  avg with Bariatric walker; 09/09/2023= 0.14 m/s; 10/02/2023= 0.09 m/s with Bariatric walker and Increased cues to swing RLE through vs. Previous sesisons. 11/13/2023= 0.12 m/s with Bariatric walker; 12/11/2023= 0.14 m/s Goal status: ONGOING  4.  Pt will increase hip flexor and abductor  gross strength to 4+/5 bilat as to improve functional strength for independent gait, increased standing tolerance, and increased ADL ability. Baseline: 10/01/22 4+/5, exception hip flexors and abductors 4/5 each bilat  (previously 03/27/22: 4+/5 ) 9/10: Hip flexor 4/5, hip abductor 4+/5 10/22: hip flexor 4/5 hip abductor 4+/5 11/26: hip flexor 4+/5 hip abductor 4+/5 Goal status: MET  5.  Pt will ambulate 150 ft with BRW and WC follow with no rest breaks in less than 12 min to allow for improved mobility within home and increased independence. Baseline: 10/03/22 150' with BRW 04/03/22: 105 ft with BRW 7/29: 100 in 9:30 with pediatric RW 9/10:101 ft with BRW stooped posture worsened throughout test 12/24/2022- 150 feet using BRW, CGA with w/c follow- in 10 min 17 sec. Will keep goal to make sure he can do this consistently 10/22: 119 ft with BRW 8 min 23 seconds terminated by patient  11/26: 148 ft with BRW 14 min 34 seconds terminated by patient 03/20/2023= 152 feet using BRW, CGA and w/c follow in 14 min 3 sec- Need to keep goal active to ensure consistency but will revised and add a time element to goal. 04/22/2023= 162 feet in 11 min 54 sec using bariatric RW, CGA and close CGA assist; 05/27/2023- 170 feet without rest break using bariatric RW. 06/17/2023= 160 feet with bariatric 4WW in 9 min- stopped due to fatigue.  Goal status: MET   6. Pt will decrease TUG to below 1 min 30 seconds/decrease in order to demonstrate decreased fall risk. Baseline: 06/17/2023= 2 min using bariatric 4WW; 09/09/2023= 1 min 50 sec with bariatric front wheeled walker. 12/02/2023= 1 min 44 sec  Goal status: PROGRESSING  7. Pt will ambulate 300 ft  with BRW, CGA and WC follow with no rest breaks in less than 20 min to allow for improved mobility within home/community and increased independence. Baseline: 06/17/2023= 160 feet with bariatric 4WW in 9 min- stopped due to fatigue. 07/17/2023- Patient able to walk 270 feet in > 20 min with BRW, CGA and close w/c follow today- VC for posture and for gait sequencing; 08/21/2023- Not specifically testing but patient has been walking 160 feet or more each Visit; 09/09/2023= Patient ambulated 180 feet overall at one time today using bariatric RW, CGA with VC for erect posture and for increased step length. 10/02/2023= 165 feet with Bariatric RW- close w/c follow and CGA. More diffiuclty with gait sequencing more consistent VC for erect posture. 12/02/2023- Patient ambulated >100 feet in // bars today but will reassess using walker next visit. 12/11/2023= Patient ambulated 204 feet with BRW and Close CGA and w/c follow Goal Status: PROGRESSING  8. Patient will report ability to walk out his front door including stepping over threshold using his walker with min A of caregiver to enable him to be more independent with household mobility and be able to go out to porch with less dependence on caregivers. Baseline: Patient dependent on caregivers to push him in his wheelchair to go out  to porch and reports dependent on them to always assist so he doesn't go outside like he would like to. Goal status: NEW  ASSESSMENT :  CLINICAL IMPRESSION:    Patient performed well with standing activities today - including ability to stand without UE support with less forward trunk lean. He was able to lift his LE better overall today as well with step taps. Pt will continue to benefit from skilled therapy to address remaining deficits in order to improve overall QoL and return to PLOF.     OBJECTIVE IMPAIRMENTS Abnormal gait, decreased activity tolerance, decreased balance, decreased coordination, decreased endurance, decreased  mobility, difficulty walking, decreased ROM, decreased strength, improper body mechanics, postural dysfunction, and obesity.   ACTIVITY LIMITATIONS carrying, lifting, bending, sitting, standing, squatting, stairs, transfers, bed mobility, continence, bathing, toileting, dressing, hygiene/grooming, and locomotion level  PARTICIPATION LIMITATIONS: meal prep, cleaning, laundry, interpersonal relationship, driving, shopping, community activity, and yard work  PERSONAL FACTORS Fitness, Past/current experiences, Time since onset of injury/illness/exacerbation, and 3+ comorbidities: hx of brain tumor, past CVA, HTN, obesity, learning disability, DMII are also affecting patient's functional outcome.   REHAB POTENTIAL: Fair    CLINICAL DECISION MAKING: Stable/uncomplicated  EVALUATION COMPLEXITY: Low  PLAN: PT FREQUENCY: 1-2x/week  PT DURATION: 12 weeks  PLANNED INTERVENTIONS: Therapeutic exercises, Therapeutic activity, Neuromuscular re-education, Balance training, Gait training, Patient/Family education, Joint mobilization, Stair training, Vestibular training, Orthotic/Fit training, DME instructions, Dry Needling, Electrical stimulation, Wheelchair mobility training, Cryotherapy, Moist heat, Compression bandaging, Manual therapy, and Re-evaluation  PLAN FOR NEXT SESSION:   -PROGRESS REPORT NEXT VISIT -Continue with functional strengthening exercises of the LE's  -Gait activities using RW. Incorporate proper technique with all transfers to decrease his risk of falling.    Fonda LELON Simpers PT  Physical Therapist- [place]  [facility]  3:56 PM, 01/29/24

## 2024-02-03 ENCOUNTER — Ambulatory Visit

## 2024-02-03 DIAGNOSIS — R531 Weakness: Secondary | ICD-10-CM

## 2024-02-03 DIAGNOSIS — I69351 Hemiplegia and hemiparesis following cerebral infarction affecting right dominant side: Secondary | ICD-10-CM

## 2024-02-03 DIAGNOSIS — R296 Repeated falls: Secondary | ICD-10-CM

## 2024-02-03 DIAGNOSIS — R262 Difficulty in walking, not elsewhere classified: Secondary | ICD-10-CM | POA: Diagnosis not present

## 2024-02-03 DIAGNOSIS — R2681 Unsteadiness on feet: Secondary | ICD-10-CM

## 2024-02-03 DIAGNOSIS — R278 Other lack of coordination: Secondary | ICD-10-CM

## 2024-02-03 DIAGNOSIS — M6281 Muscle weakness (generalized): Secondary | ICD-10-CM

## 2024-02-03 DIAGNOSIS — R269 Unspecified abnormalities of gait and mobility: Secondary | ICD-10-CM

## 2024-02-03 DIAGNOSIS — R2689 Other abnormalities of gait and mobility: Secondary | ICD-10-CM

## 2024-02-03 NOTE — Therapy (Signed)
 OUTPATIENT PHYSICAL THERAPY TREATMENT  Patient Name: Curtis Powell MRN: [id number] DOB:04-16-1965, 58 y.o., male Today's Date: 02/04/2024  PCP: Sherial Bail, MD  REFERRING PROVIDER: Sherial Bail, MD    PT End of Session - 02/03/24 1322     Visit Number 121    Number of Visits 130    Date for Recertification  02/24/24    Progress Note Due on Visit 130    PT Start Time 1315    PT Stop Time 1359    PT Time Calculation (min) 44 min    Equipment Utilized During Treatment Gait belt    Activity Tolerance Patient tolerated treatment well    Behavior During Therapy WFL for tasks assessed/performed         Past Medical History:  Diagnosis Date   Asthma    GERD (gastroesophageal reflux disease)    Hyperlipidemia    Hypertension    Obesity    Stroke [facility])    Past Surgical History:  Procedure Laterality Date   BRAIN SURGERY     Patient Active Problem List   Diagnosis Date Noted   Impaired glucose tolerance 03/23/2020   Learning disability 03/23/2020   Urinary and fecal incontinence 06/14/2019   Chronic pain syndrome 03/23/2019   Constipation 03/23/2019   Dysarthria 03/23/2019   Dysphagia 03/23/2019   Learning difficulty 03/23/2019   Morbid obesity (HCC) 03/23/2019   Muscle weakness 03/23/2019   Vitamin D deficiency 03/23/2019   Bilateral carotid artery stenosis 12/15/2018   Moderate aortic valve stenosis 12/15/2018   BMI 45.0-49.9, adult (HCC) 11/10/2018   Cerebrovascular accident (HCC) 05/02/2016   Hemiparesis affecting right side as late effect of cerebrovascular accident (HCC) 05/01/2016   Brain tumor (HCC) 11/24/2014   Gastro-esophageal reflux disease without esophagitis 11/24/2014   Accident due to mechanical fall without injury 08/03/2014   Essential hypertension 06/13/2014   Mixed hyperlipidemia 06/13/2014   Unspecified sequelae of cerebral infarction 06/13/2014   Thyromegaly 01/07/2014   Type 2 diabetes mellitus without complication 01/06/2014    Neck mass 09/08/2013   Swimmer's ear 09/08/2013   Erectile dysfunction 01/03/2013   Prediabetes 01/03/2013   ONSET DATE: 05/09/2016  REFERRING DIAG: I69.351 (ICD-10-CM) - Hemiplegia and hemiparesis following cerebral infarction affecting right dominant side   THERAPY DIAG:  Unsteadiness on feet  Difficulty in walking, not elsewhere classified  Muscle weakness (generalized)  Abnormality of gait and mobility  Left-sided weakness  Repeated falls  Other abnormalities of gait and mobility  Hemiplegia and hemiparesis following cerebral infarction affecting right dominant side (HCC)  Other lack of coordination   Rationale for Evaluation and Treatment Rehabilitation  SUBJECTIVE:  SUBJECTIVE STATEMENT  Pt reports that he's been walking some at home.    Pt accompanied by: aunt  PERTINENT HISTORY:  The pt is a pleasant 58 yo male who returns to PT for evaluation of deficits s/o old CVA. Pt with hx of CVA Feb 2018. He was previously fully independent, but CVA caused significant deficits, mainly affecting his right side. Pt reports continued difficulty with walking and transfers. Pt still using WC for majority of mobility, otherwise he uses his RW. Pt lives with his mom who is his main caregiver. Patient had outpatient PT previously in clinic in 2023. He reports since discharge he has been completing his HEP and walking about every day. Pt would like to improve his walking mainly, but also would like to work on balance. He also reports difficulty with transfers. Pt's mom and his caregiver helps him with ADLs. However, he says he is now waiting for a new caregiver. Prior to this pt had caregiver 3x/week. PMH significant for hx of brain tumor, past CVA, DMII and HTN. See chart for additional  information.  PAIN:  Are you having pain? Pt reports no pain currently  PRECAUTIONS: fall  WEIGHT BEARING RESTRICTIONS none  PLOF: required assistance with ADLs (old CVA), prior to CVA in 2018 was indep.  History of Falls: Pt reports no recent falls  Home information: Pt lives with mother who is his primary caregiver. Previously had home aide, 3x/week but reports currently waiting for new one.  PATIENT GOALS: Pt would like to improve his ability to ambulate.  OBJECTIVE:  taken at [facility] unless otherwise noted:   DIAGNOSTIC FINDINGS: No recent imaging per chart  Objective measures taken at eval unless otherwise specified:   Posture: forward head posture, rounded shoulders  MMT LE: grossly  4+/5, exception hip flexors and abductors 4/5 each bilat   Functional tests:  5xSTS:  34 sec, use of BUE (multiple initial attempts with posterior LOB in chair)   : 0.096 m/s with RW, close CGA, and WC follow  Gait for distance (goal 150 ft): deferred due to time  Gait mechanics: impaired, decreased speed (see ), crouched posture, poor postural stability and relies heavily on BUE support on RW. Pt now with bilat AFOS   FOTO: 44 (goal 50)   TODAY'S TREATMENT : 02/04/24   Gait training:  Fwd walking and 180 deg then walk back - x 2 trials in // bars. Performed 3 total rds with brief rest break- minimal Knee flex with walking- and difficulty accepting verbal cues or visual external cues for increased step length.  Practiced TUG today since his time for progress report was off last time. He performed TUG 4 times  exhibiting times from 2:18 to 3:22 varying on ability to turn safey and efficiently.   TA:  Side stepping to focus on lateral functional mobility for transfers/walking- in // bars down and back x 2 without resting. 3 total rds - VC for increased step width.       PATIENT EDUCATION: Education details: If pt can lean pelvis into support surface, his energy efficicency  will be higher.  Person educated: Patient Education method: Explanation, Demonstration, Tactile cues, and Verbal cues Education comprehension: verbalized understanding, returned demonstration, and verbal cues required  HOME EXERCISE PROGRAM:  Standing hip flexor stretch at counter, prolonged standing with erect posture for gluteal activation, HS curls with RTB  Access Code: [email or web address] URL: [email or web address] Date: 05/15/2023 Prepared by: Connell Kiss  Exercises - Seated Long [address] with  Ankle Weight  - 1 x daily - 7 x weekly - 2 sets - 10 reps  GOALS: Goals reviewed with patient? No  SHORT TERM GOALS: Target date: 11/12/2022  Pt will be independent with HEP in order to improve strength and balance in order to decrease fall risk and improve function at home. Baseline: instructed in HEP today, to provide printout next visit Goal status: MET  LONG TERM GOALS: Target date: 02/24/2024  Pt will increase FOTO to at least 50 in order to demonstrate improvement in function related to mobility and QoL. Baseline: 44 7/29:48 9/10:40 12/24/2022= 51 Goal status: MET  2.  Pt (< 60 yrs old) will complete 5xSTS test < 30 seconds indicating an increase in LE strength and improved balance. Baseline: 10/01/22 34 sec (multiple initial attempts with posterior LOB in chair) 12/20: 49 seconds 11/04/22:1:06, min A on multiple attempts to maintain balance and pt stays in flexed posture throughout test when coming to standing.   9/10: 27.5 sec, Pushes WC post consistently, close CGA  10/22: 47 seconds first attempt 12/5: 1 minute, 37 seconds, multiple attempts to get first stand, but no difficulty after that; 03/20/2023= 1 min 2 sec with 1UE on walker and 1 on armrest- some posterior lean- Close CGA; 06/17/2023= 38 sec with 1 UE on walker and 1 on armrest; 08/21/2023= 35 sec with 1UE on walker and 1 on armrest.  8/5: 36 sec with 1 UE on walker 1 on arm rest; 12/23/2023= 37.05 sec with some  posterior LOB- CGA to min A to correct.  01/29/24: 54.77 sec with significant posterior LOB and needing modA to come upright into standing Goal status: ONGOING  3.  Pt will increase to > 0.5 m/s (30 sec) as to improve gait speed for better home ambulation and reduce fall risk. Baseline: 10/01/22 0.096 m/s with RW, close CGA, and WC follow previously 12/27: 0.11 m/s 11/04/22: .056 m/s  9/10: 076 m/s 10/22: 0.64 m/s  11/26: .076 m/s; 03/20/2023= 0.08 m/s; 04/22/2023= 0.089 m/s; 05/15/2023 = 0.079 m/s; 05/27/2023= 0.11 m/s; 07/17/2023= 0.15 m/s; 08/21/2023= 0.14 m/s avg with Bariatric walker; 09/09/2023= 0.14 m/s; 10/02/2023= 0.09 m/s with Bariatric walker and Increased cues to swing RLE through vs. Previous sesisons. 11/13/2023= 0.12 m/s with Bariatric walker; 12/11/2023= 0.14 m/s; 12/23/2023=0.135 m/s 01/29/24: 3 min 25 sec, 0.05 m/s Goal status: NOT PROGRESSING  4.  Pt will increase hip flexor and abductor  gross strength to 4+/5 bilat as to improve functional strength for independent gait, increased standing tolerance, and increased ADL ability. Baseline: 10/01/22 4+/5, exception hip flexors and abductors 4/5 each bilat  (previously 03/27/22: 4+/5 ) 9/10: Hip flexor 4/5, hip abductor 4+/5 10/22: hip flexor 4/5 hip abductor 4+/5 11/26: hip flexor 4+/5 hip abductor 4+/5 Goal status: MET  5.  Pt will ambulate 150 ft with BRW and WC follow with no rest breaks in less than 12 min to allow for improved mobility within home and increased independence. Baseline: 10/03/22 150' with BRW 04/03/22: 105 ft with BRW 7/29: 100 in 9:30 with pediatric RW 9/10:101 ft with BRW stooped posture worsened throughout test 12/24/2022- 150 feet using BRW, CGA with w/c follow- in 10 min 17 sec. Will keep goal to make sure he can do this consistently 10/22: 119 ft with BRW 8 min 23 seconds terminated by patient  11/26: 148 ft with BRW 14 min 34 seconds terminated by patient 03/20/2023= 152 feet using BRW, CGA and w/c follow in 14  min 3 sec- Need  to keep goal active to ensure consistency but will revised and add a time element to goal. 04/22/2023= 162 feet in 11 min 54 sec using bariatric RW, CGA and close CGA assist; 05/27/2023- 170 feet without rest break using bariatric RW. 06/17/2023= 160 feet with bariatric 4WW in 9 min- stopped due to fatigue.  Goal status: MET   6. Pt will decrease TUG to below 1 min 30 seconds/decrease in order to demonstrate decreased fall risk. Baseline: 06/17/2023= 2 min using bariatric 4WW; 09/09/2023= 1 min 50 sec with bariatric front wheeled walker.  12/02/2023= 1 min 44 sec;  12/23/2023= 2 min 8 sec avg with BRW 01/29/24: 3 min 42 sec with BRW Goal status: ONGOING  7. Pt will ambulate 300 ft with BRW, CGA and WC follow with no rest breaks in less than 20 min to allow for improved mobility within home/community and increased independence. Baseline: 06/17/2023= 160 feet with bariatric 4WW in 9 min- stopped due to fatigue. 07/17/2023- Patient able to walk 270 feet in > 20 min with BRW, CGA and close w/c follow today- VC for posture and for gait sequencing; 08/21/2023- Not specifically testing but patient has been walking 160 feet or more each Visit; 09/09/2023= Patient ambulated 180 feet overall at one time today using bariatric RW, CGA with VC for erect posture and for increased step length.  10/02/2023= 165 feet with Bariatric RW- close w/c follow and CGA. More diffiuclty with gait sequencing more consistent VC for erect posture.  12/02/2023- Paitent ambulated >100 feet in // bars today but will reassess using walker next visit.  12/11/2023= Patient ambulated 204 feet with BRW and Close CGA and w/c follow 01/29/24: Patient ambulated 111 feet with BRW and Close CGA and w/c follow (10 min 59 sec) before needing to rest Goal Status: PROGRESSING  8. Patient will report ability to walk out his front door including stepping over threshold using his walker with min A of caregiver to enable him to be more independent  with household mobility and be able to go out to porch with less dependence on caregivers. Baseline: Patient dependent on caregivers to push him in his wheelchair to go out to porch and reports dependent on them to always assist so he doesn't go outside like he would like to. 01/29/24: Pt reports he has not been able to attempt this due to not having a male at home to assist him. Goal status: NEW  ASSESSMENT :  CLINICAL IMPRESSION:   Patient presented with good effort today- concentrating on mobility and spending time up on feet and moving- most difficulty with increasing his step length and turning- He struggled with shifting his walker- Not enough balance to actually pick up walker and pivot and some difficulty judging spacing for proper turning to save time with TUG. He will continue to benefit from working on becoming more efficient with turning.  Pt will continue to benefit from skilled therapy to address remaining deficits in order to improve overall QoL and return to PLOF.       OBJECTIVE IMPAIRMENTS Abnormal gait, decreased activity tolerance, decreased balance, decreased coordination, decreased endurance, decreased mobility, difficulty walking, decreased ROM, decreased strength, improper body mechanics, postural dysfunction, and obesity.   ACTIVITY LIMITATIONS carrying, lifting, bending, sitting, standing, squatting, stairs, transfers, bed mobility, continence, bathing, toileting, dressing, hygiene/grooming, and locomotion level  PARTICIPATION LIMITATIONS: meal prep, cleaning, laundry, interpersonal relationship, driving, shopping, community activity, and yard work  PERSONAL FACTORS Fitness, Past/current experiences, Time since onset of injury/illness/exacerbation, and  3+ comorbidities: hx of brain tumor, past CVA, HTN, obesity, learning disability, DMII are also affecting patient's functional outcome.   REHAB POTENTIAL: Fair    CLINICAL DECISION MAKING:  Stable/uncomplicated  EVALUATION COMPLEXITY: Low  PLAN: PT FREQUENCY: 1-2x/week  PT DURATION: 12 weeks  PLANNED INTERVENTIONS: Therapeutic exercises, Therapeutic activity, Neuromuscular re-education, Balance training, Gait training, Patient/Family education, Joint mobilization, Stair training, Vestibular training, Orthotic/Fit training, DME instructions, Dry Needling, Electrical stimulation, Wheelchair mobility training, Cryotherapy, Moist heat, Compression bandaging, Manual therapy, and Re-evaluation  PLAN FOR NEXT SESSION:   -Bridging and review of some bed LE strengthening -Continue with functional strengthening exercises of the LE's  -Gait activities using RW. Incorporate proper technique with all transfers to decrease his risk of falling.   Reyes LOISE London PT  Physical Therapist- [facility]   12:23 PM 02/04/24

## 2024-02-04 ENCOUNTER — Ambulatory Visit
Admission: RE | Admit: 2024-02-04 | Discharge: 2024-02-04 | Disposition: A | Discharge status: Home / Self Care | Source: Ambulatory Visit | Attending: Internal Medicine | Admitting: Internal Medicine

## 2024-02-04 DIAGNOSIS — H532 Diplopia: Secondary | ICD-10-CM | POA: Diagnosis present

## 2024-02-04 DIAGNOSIS — D329 Benign neoplasm of meninges, unspecified: Secondary | ICD-10-CM | POA: Insufficient documentation

## 2024-02-04 MED ORDER — GADOBUTROL 1 MMOL/ML IV SOLN
10.0000 mL | Freq: Once | INTRAVENOUS | Status: AC | PRN
Start: 2024-02-04 — End: 2024-02-04
  Administered 2024-02-04: 10 mL via INTRAVENOUS

## 2024-02-05 ENCOUNTER — Ambulatory Visit

## 2024-02-05 DIAGNOSIS — R296 Repeated falls: Secondary | ICD-10-CM

## 2024-02-05 DIAGNOSIS — I69351 Hemiplegia and hemiparesis following cerebral infarction affecting right dominant side: Secondary | ICD-10-CM

## 2024-02-05 DIAGNOSIS — R262 Difficulty in walking, not elsewhere classified: Secondary | ICD-10-CM

## 2024-02-05 DIAGNOSIS — R278 Other lack of coordination: Secondary | ICD-10-CM

## 2024-02-05 DIAGNOSIS — R531 Weakness: Secondary | ICD-10-CM

## 2024-02-05 DIAGNOSIS — M6281 Muscle weakness (generalized): Secondary | ICD-10-CM

## 2024-02-05 DIAGNOSIS — R269 Unspecified abnormalities of gait and mobility: Secondary | ICD-10-CM

## 2024-02-05 DIAGNOSIS — R2689 Other abnormalities of gait and mobility: Secondary | ICD-10-CM

## 2024-02-05 DIAGNOSIS — R2681 Unsteadiness on feet: Secondary | ICD-10-CM

## 2024-02-05 NOTE — Therapy (Signed)
 OUTPATIENT PHYSICAL THERAPY TREATMENT  Patient Name: Curtis Powell MRN: [id number] DOB:1965/06/01, 58 y.o., male Today's Date: 02/05/2024  PCP: Sherial Bail, MD  REFERRING PROVIDER: Sherial Bail, MD    PT End of Session - 02/05/24 1338     Visit Number 122    Number of Visits 130    Date for Recertification  02/24/24    Progress Note Due on Visit 130    PT Start Time 1315    PT Stop Time 1359    PT Time Calculation (min) 44 min    Equipment Utilized During Treatment Gait belt    Activity Tolerance Patient tolerated treatment well    Behavior During Therapy WFL for tasks assessed/performed         Past Medical History:  Diagnosis Date   Asthma    GERD (gastroesophageal reflux disease)    Hyperlipidemia    Hypertension    Obesity    Stroke [facility])    Past Surgical History:  Procedure Laterality Date   BRAIN SURGERY     Patient Active Problem List   Diagnosis Date Noted   Impaired glucose tolerance 03/23/2020   Learning disability 03/23/2020   Urinary and fecal incontinence 06/14/2019   Chronic pain syndrome 03/23/2019   Constipation 03/23/2019   Dysarthria 03/23/2019   Dysphagia 03/23/2019   Learning difficulty 03/23/2019   Morbid obesity (HCC) 03/23/2019   Muscle weakness 03/23/2019   Vitamin D deficiency 03/23/2019   Bilateral carotid artery stenosis 12/15/2018   Moderate aortic valve stenosis 12/15/2018   BMI 45.0-49.9, adult (HCC) 11/10/2018   Cerebrovascular accident (HCC) 05/02/2016   Hemiparesis affecting right side as late effect of cerebrovascular accident (HCC) 05/01/2016   Brain tumor (HCC) 11/24/2014   Gastro-esophageal reflux disease without esophagitis 11/24/2014   Accident due to mechanical fall without injury 08/03/2014   Essential hypertension 06/13/2014   Mixed hyperlipidemia 06/13/2014   Unspecified sequelae of cerebral infarction 06/13/2014   Thyromegaly 01/07/2014   Type 2 diabetes mellitus without complication 01/06/2014    Neck mass 09/08/2013   Swimmer's ear 09/08/2013   Erectile dysfunction 01/03/2013   Prediabetes 01/03/2013   ONSET DATE: 05/09/2016  REFERRING DIAG: I69.351 (ICD-10-CM) - Hemiplegia and hemiparesis following cerebral infarction affecting right dominant side   THERAPY DIAG:  Unsteadiness on feet  Difficulty in walking, not elsewhere classified  Muscle weakness (generalized)  Abnormality of gait and mobility  Left-sided weakness  Repeated falls  Other abnormalities of gait and mobility  Hemiplegia and hemiparesis following cerebral infarction affecting right dominant side (HCC)  Other lack of coordination   Rationale for Evaluation and Treatment Rehabilitation  SUBJECTIVE:  SUBJECTIVE STATEMENT  Pt reports no changes since last visit.   Pt accompanied by: aunt  PERTINENT HISTORY:  The pt is a pleasant 58 yo male who returns to PT for evaluation of deficits s/o old CVA. Pt with hx of CVA Feb 2018. He was previously fully independent, but CVA caused significant deficits, mainly affecting his right side. Pt reports continued difficulty with walking and transfers. Pt still using WC for majority of mobility, otherwise he uses his RW. Pt lives with his mom who is his main caregiver. Patient had outpatient PT previously in clinic in 2023. He reports since discharge he has been completing his HEP and walking about every day. Pt would like to improve his walking mainly, but also would like to work on balance. He also reports difficulty with transfers. Pt's mom and his caregiver helps him with ADLs. However, he says he is now waiting for a new caregiver. Prior to this pt had caregiver 3x/week. PMH significant for hx of brain tumor, past CVA, DMII and HTN. See chart for additional information.  PAIN:   Are you having pain? Pt reports no pain currently  PRECAUTIONS: fall  WEIGHT BEARING RESTRICTIONS none  PLOF: required assistance with ADLs (old CVA), prior to CVA in 2018 was indep.  History of Falls: Pt reports no recent falls  Home information: Pt lives with mother who is his primary caregiver. Previously had home aide, 3x/week but reports currently waiting for new one.  PATIENT GOALS: Pt would like to improve his ability to ambulate.  OBJECTIVE:  taken at [facility] unless otherwise noted:   DIAGNOSTIC FINDINGS: No recent imaging per chart  Objective measures taken at eval unless otherwise specified:   Posture: forward head posture, rounded shoulders  MMT LE: grossly  4+/5, exception hip flexors and abductors 4/5 each bilat   Functional tests:  5xSTS:  34 sec, use of BUE (multiple initial attempts with posterior LOB in chair)   : 0.096 m/s with RW, close CGA, and WC follow  Gait for distance (goal 150 ft): deferred due to time  Gait mechanics: impaired, decreased speed (see ), crouched posture, poor postural stability and relies heavily on BUE support on RW. Pt now with bilat AFOS   FOTO: 44 (goal 50)   TODAY'S TREATMENT : 02/05/24   Gait training:  Fwd/retro walking in // bars - back and forth x 4- VC for increased step length with retro steps (Increased time to complete each Lap) counted steps back- from 20 down to 11 with max VC's   TA:  Side stepping to focus on lateral functional mobility for transfers/walking- in // bars down and back x 2 without resting. 3 total rds - VC for increased step width.   Static stand in standing frame to focus on more erect standing- Patient was able to tolerate approx 10 min focusing on looking up and minimal UE movement despite being fully supported.       PATIENT EDUCATION: Education details: If pt can lean pelvis into support surface, his energy efficicency will be higher.  Person educated: Patient Education  method: Explanation, Demonstration, Tactile cues, and Verbal cues Education comprehension: verbalized understanding, returned demonstration, and verbal cues required  HOME EXERCISE PROGRAM:  Standing hip flexor stretch at counter, prolonged standing with erect posture for gluteal activation, HS curls with RTB  Access Code: [email or web address] URL: [email or web address] Date: 05/15/2023 Prepared by: Connell Kiss  Exercises - Seated Long Arc Quad with Ankle Weight  - 1 x daily - 7  x weekly - 2 sets - 10 reps  GOALS: Goals reviewed with patient? No  SHORT TERM GOALS: Target date: 11/12/2022  Pt will be independent with HEP in order to improve strength and balance in order to decrease fall risk and improve function at home. Baseline: instructed in HEP today, to provide printout next visit Goal status: MET  LONG TERM GOALS: Target date: 02/24/2024  Pt will increase FOTO to at least 50 in order to demonstrate improvement in function related to mobility and QoL. Baseline: 44 7/29:48 9/10:40 12/24/2022= 51 Goal status: MET  2.  Pt (< 60 yrs old) will complete 5xSTS test < 30 seconds indicating an increase in LE strength and improved balance. Baseline: 10/01/22 34 sec (multiple initial attempts with posterior LOB in chair) 12/20: 49 seconds 11/04/22:1:06, min A on multiple attempts to maintain balance and pt stays in flexed posture throughout test when coming to standing.   9/10: 27.5 sec, Pushes WC post consistently, close CGA  10/22: 47 seconds first attempt 12/5: 1 minute, 37 seconds, multiple attempts to get first stand, but no difficulty after that; 03/20/2023= 1 min 2 sec with 1UE on walker and 1 on armrest- some posterior lean- Close CGA; 06/17/2023= 38 sec with 1 UE on walker and 1 on armrest; 08/21/2023= 35 sec with 1UE on walker and 1 on armrest.  8/5: 36 sec with 1 UE on walker 1 on arm rest; 12/23/2023= 37.05 sec with some posterior LOB- CGA to min A to correct.  01/29/24: 54.77  sec with significant posterior LOB and needing modA to come upright into standing Goal status: ONGOING  3.  Pt will increase to > 0.5 m/s (30 sec) as to improve gait speed for better home ambulation and reduce fall risk. Baseline: 10/01/22 0.096 m/s with RW, close CGA, and WC follow previously 12/27: 0.11 m/s 11/04/22: .056 m/s  9/10: 076 m/s 10/22: 0.64 m/s  11/26: .076 m/s; 03/20/2023= 0.08 m/s; 04/22/2023= 0.089 m/s; 05/15/2023 = 0.079 m/s; 05/27/2023= 0.11 m/s; 07/17/2023= 0.15 m/s; 08/21/2023= 0.14 m/s avg with Bariatric walker; 09/09/2023= 0.14 m/s; 10/02/2023= 0.09 m/s with Bariatric walker and Increased cues to swing RLE through vs. Previous sesisons. 11/13/2023= 0.12 m/s with Bariatric walker; 12/11/2023= 0.14 m/s; 12/23/2023=0.135 m/s 01/29/24: 3 min 25 sec, 0.05 m/s Goal status: NOT PROGRESSING  4.  Pt will increase hip flexor and abductor  gross strength to 4+/5 bilat as to improve functional strength for independent gait, increased standing tolerance, and increased ADL ability. Baseline: 10/01/22 4+/5, exception hip flexors and abductors 4/5 each bilat  (previously 03/27/22: 4+/5 ) 9/10: Hip flexor 4/5, hip abductor 4+/5 10/22: hip flexor 4/5 hip abductor 4+/5 11/26: hip flexor 4+/5 hip abductor 4+/5 Goal status: MET  5.  Pt will ambulate 150 ft with BRW and WC follow with no rest breaks in less than 12 min to allow for improved mobility within home and increased independence. Baseline: 10/03/22 150' with BRW 04/03/22: 105 ft with BRW 7/29: 100 in 9:30 with pediatric RW 9/10:101 ft with BRW stooped posture worsened throughout test 12/24/2022- 150 feet using BRW, CGA with w/c follow- in 10 min 17 sec. Will keep goal to make sure he can do this consistently 10/22: 119 ft with BRW 8 min 23 seconds terminated by patient  11/26: 148 ft with BRW 14 min 34 seconds terminated by patient 03/20/2023= 152 feet using BRW, CGA and w/c follow in 14 min 3 sec- Need to keep goal active to ensure consistency  but will  revised and add a time element to goal. 04/22/2023= 162 feet in 11 min 54 sec using bariatric RW, CGA and close CGA assist; 05/27/2023- 170 feet without rest break using bariatric RW. 06/17/2023= 160 feet with bariatric 4WW in 9 min- stopped due to fatigue.  Goal status: MET   6. Pt will decrease TUG to below 1 min 30 seconds/decrease in order to demonstrate decreased fall risk. Baseline: 06/17/2023= 2 min using bariatric 4WW; 09/09/2023= 1 min 50 sec with bariatric front wheeled walker.  12/02/2023= 1 min 44 sec;  12/23/2023= 2 min 8 sec avg with BRW 01/29/24: 3 min 42 sec with BRW Goal status: ONGOING  7. Pt will ambulate 300 ft with BRW, CGA and WC follow with no rest breaks in less than 20 min to allow for improved mobility within home/community and increased independence. Baseline: 06/17/2023= 160 feet with bariatric 4WW in 9 min- stopped due to fatigue. 07/17/2023- Patient able to walk 270 feet in > 20 min with BRW, CGA and close w/c follow today- VC for posture and for gait sequencing; 08/21/2023- Not specifically testing but patient has been walking 160 feet or more each Visit; 09/09/2023= Patient ambulated 180 feet overall at one time today using bariatric RW, CGA with VC for erect posture and for increased step length.  10/02/2023= 165 feet with Bariatric RW- close w/c follow and CGA. More diffiuclty with gait sequencing more consistent VC for erect posture.  12/02/2023- Paitent ambulated >100 feet in // bars today but will reassess using walker next visit.  12/11/2023= Patient ambulated 204 feet with BRW and Close CGA and w/c follow 01/29/24: Patient ambulated 111 feet with BRW and Close CGA and w/c follow (10 min 59 sec) before needing to rest Goal Status: PROGRESSING  8. Patient will report ability to walk out his front door including stepping over threshold using his walker with min A of caregiver to enable him to be more independent with household mobility and be able to go out to porch with  less dependence on caregivers. Baseline: Patient dependent on caregivers to push him in his wheelchair to go out to porch and reports dependent on them to always assist so he doesn't go outside like he would like to. 01/29/24: Pt reports he has not been able to attempt this due to not having a male at home to assist him. Goal status: NEW  ASSESSMENT :  CLINICAL IMPRESSION:   Treatment focused on continued functional mobility with emphasis on taking longer step to make his gait more efficient. He was able to progress some and much improved with retro steps. He was later able to use the standing frame to focus on erect standing- stating feeling the stretch in front of his legs but denied any pain. Will attempt again in some future visits to improve his overall posture.  Pt will continue to benefit from skilled therapy to address remaining deficits in order to improve overall QoL and return to PLOF.       OBJECTIVE IMPAIRMENTS Abnormal gait, decreased activity tolerance, decreased balance, decreased coordination, decreased endurance, decreased mobility, difficulty walking, decreased ROM, decreased strength, improper body mechanics, postural dysfunction, and obesity.   ACTIVITY LIMITATIONS carrying, lifting, bending, sitting, standing, squatting, stairs, transfers, bed mobility, continence, bathing, toileting, dressing, hygiene/grooming, and locomotion level  PARTICIPATION LIMITATIONS: meal prep, cleaning, laundry, interpersonal relationship, driving, shopping, community activity, and yard work  PERSONAL FACTORS Fitness, Past/current experiences, Time since onset of injury/illness/exacerbation, and 3+ comorbidities: hx of brain tumor, past CVA,  HTN, obesity, learning disability, DMII are also affecting patient's functional outcome.   REHAB POTENTIAL: Fair    CLINICAL DECISION MAKING: Stable/uncomplicated  EVALUATION COMPLEXITY: Low  PLAN: PT FREQUENCY: 1-2x/week  PT DURATION: 12  weeks  PLANNED INTERVENTIONS: Therapeutic exercises, Therapeutic activity, Neuromuscular re-education, Balance training, Gait training, Patient/Family education, Joint mobilization, Stair training, Vestibular training, Orthotic/Fit training, DME instructions, Dry Needling, Electrical stimulation, Wheelchair mobility training, Cryotherapy, Moist heat, Compression bandaging, Manual therapy, and Re-evaluation  PLAN FOR NEXT SESSION:   -Bridging and review of some bed LE strengthening -Continue with functional strengthening exercises of the LE's  -Gait activities using RW. Incorporate proper technique with all transfers to decrease his risk of falling.   Reyes LOISE London PT  Physical Therapist- [facility]   9:41 PM 02/05/24

## 2024-02-10 ENCOUNTER — Ambulatory Visit

## 2024-02-10 NOTE — Therapy (Incomplete)
 OUTPATIENT PHYSICAL THERAPY TREATMENT  Patient Name: CHRISTYAN REGER MRN: [id number] DOB:Jun 03, 1965, 58 y.o., male Today's Date: 02/10/2024  PCP: Sherial Bail, MD  REFERRING PROVIDER: Sherial Bail, MD     Past Medical History:  Diagnosis Date   Asthma    GERD (gastroesophageal reflux disease)    Hyperlipidemia    Hypertension    Obesity    Stroke [facility])    Past Surgical History:  Procedure Laterality Date   BRAIN SURGERY     Patient Active Problem List   Diagnosis Date Noted   Impaired glucose tolerance 03/23/2020   Learning disability 03/23/2020   Urinary and fecal incontinence 06/14/2019   Chronic pain syndrome 03/23/2019   Constipation 03/23/2019   Dysarthria 03/23/2019   Dysphagia 03/23/2019   Learning difficulty 03/23/2019   Morbid obesity (HCC) 03/23/2019   Muscle weakness 03/23/2019   Vitamin D deficiency 03/23/2019   Bilateral carotid artery stenosis 12/15/2018   Moderate aortic valve stenosis 12/15/2018   BMI 45.0-49.9, adult (HCC) 11/10/2018   Cerebrovascular accident (HCC) 05/02/2016   Hemiparesis affecting right side as late effect of cerebrovascular accident (HCC) 05/01/2016   Brain tumor (HCC) 11/24/2014   Gastro-esophageal reflux disease without esophagitis 11/24/2014   Accident due to mechanical fall without injury 08/03/2014   Essential hypertension 06/13/2014   Mixed hyperlipidemia 06/13/2014   Unspecified sequelae of cerebral infarction 06/13/2014   Thyromegaly 01/07/2014   Type 2 diabetes mellitus without complication 01/06/2014   Neck mass 09/08/2013   Swimmer's ear 09/08/2013   Erectile dysfunction 01/03/2013   Prediabetes 01/03/2013   ONSET DATE: 05/09/2016  REFERRING DIAG: I69.351 (ICD-10-CM) - Hemiplegia and hemiparesis following cerebral infarction affecting right dominant side   THERAPY DIAG:  No diagnosis found.   Rationale for Evaluation and Treatment Rehabilitation  SUBJECTIVE:                                                                                                                                                                                              SUBJECTIVE STATEMENT  ***Pt reports no changes since last visit.   Pt accompanied by: aunt  PERTINENT HISTORY:  The pt is a pleasant 58 yo male who returns to PT for evaluation of deficits s/o old CVA. Pt with hx of CVA Feb 2018. He was previously fully independent, but CVA caused significant deficits, mainly affecting his right side. Pt reports continued difficulty with walking and transfers. Pt still using WC for majority of mobility, otherwise he uses his RW. Pt lives with his mom who is his main caregiver. Patient had outpatient PT previously in clinic in 2023. He reports since  discharge he has been completing his HEP and walking about every day. Pt would like to improve his walking mainly, but also would like to work on balance. He also reports difficulty with transfers. Pt's mom and his caregiver helps him with ADLs. However, he says he is now waiting for a new caregiver. Prior to this pt had caregiver 3x/week. PMH significant for hx of brain tumor, past CVA, DMII and HTN. See chart for additional information.  PAIN:  Are you having pain? Pt reports no pain currently  PRECAUTIONS: fall  WEIGHT BEARING RESTRICTIONS none  PLOF: required assistance with ADLs (old CVA), prior to CVA in 2018 was indep.  History of Falls: Pt reports no recent falls  Home information: Pt lives with mother who is his primary caregiver. Previously had home aide, 3x/week but reports currently waiting for new one.  PATIENT GOALS: Pt would like to improve his ability to ambulate.  OBJECTIVE:  taken at [facility] unless otherwise noted:   DIAGNOSTIC FINDINGS: No recent imaging per chart  Objective measures taken at eval unless otherwise specified:   Posture: forward head posture, rounded shoulders  MMT LE: grossly  4+/5, exception hip flexors and abductors 4/5  each bilat   Functional tests:  5xSTS:  34 sec, use of BUE (multiple initial attempts with posterior LOB in chair)   : 0.096 m/s with RW, close CGA, and WC follow  Gait for distance (goal 150 ft): deferred due to time  Gait mechanics: impaired, decreased speed (see ), crouched posture, poor postural stability and relies heavily on BUE support on RW. Pt now with bilat AFOS   FOTO: 44 (goal 50)   TODAY'S TREATMENT : 02/10/24   Gait training:  Fwd/retro walking in // bars - back and forth x 4- VC for increased step length with retro steps (Increased time to complete each Lap) counted steps back- from 20 down to 11 with max VC's   TA:  Side stepping to focus on lateral functional mobility for transfers/walking- in // bars down and back x 2 without resting. 3 total rds - VC for increased step width.   Static stand in standing frame to focus on more erect standing- Patient was able to tolerate approx 10 min focusing on looking up and minimal UE movement despite being fully supported.       PATIENT EDUCATION: Education details: If pt can lean pelvis into support surface, his energy efficicency will be higher.  Person educated: Patient Education method: Explanation, Demonstration, Tactile cues, and Verbal cues Education comprehension: verbalized understanding, returned demonstration, and verbal cues required  HOME EXERCISE PROGRAM:  Standing hip flexor stretch at counter, prolonged standing with erect posture for gluteal activation, HS curls with RTB  Access Code: [email or web address] URL: [email or web address] Date: 05/15/2023 Prepared by: Connell Kiss  Exercises - Seated Long Arc Quad with Ankle Weight  - 1 x daily - 7 x weekly - 2 sets - 10 reps  GOALS: Goals reviewed with patient? No  SHORT TERM GOALS: Target date: 11/12/2022  Pt will be independent with HEP in order to improve strength and balance in order to decrease fall risk and improve function at  home. Baseline: instructed in HEP today, to provide printout next visit Goal status: MET  LONG TERM GOALS: Target date: 02/24/2024  Pt will increase FOTO to at least 50 in order to demonstrate improvement in function related to mobility and QoL. Baseline: 44 7/29:48 9/10:40 12/24/2022= 51 Goal status: MET  2.  Pt (< 60 yrs  old) will complete 5xSTS test < 30 seconds indicating an increase in LE strength and improved balance. Baseline: 10/01/22 34 sec (multiple initial attempts with posterior LOB in chair) 12/20: 49 seconds 11/04/22:1:06, min A on multiple attempts to maintain balance and pt stays in flexed posture throughout test when coming to standing.   9/10: 27.5 sec, Pushes WC post consistently, close CGA  10/22: 47 seconds first attempt 12/5: 1 minute, 37 seconds, multiple attempts to get first stand, but no difficulty after that; 03/20/2023= 1 min 2 sec with 1UE on walker and 1 on armrest- some posterior lean- Close CGA; 06/17/2023= 38 sec with 1 UE on walker and 1 on armrest; 08/21/2023= 35 sec with 1UE on walker and 1 on armrest.  8/5: 36 sec with 1 UE on walker 1 on arm rest; 12/23/2023= 37.05 sec with some posterior LOB- CGA to min A to correct.  01/29/24: 54.77 sec with significant posterior LOB and needing modA to come upright into standing Goal status: ONGOING  3.  Pt will increase to > 0.5 m/s (30 sec) as to improve gait speed for better home ambulation and reduce fall risk. Baseline: 10/01/22 0.096 m/s with RW, close CGA, and WC follow previously 12/27: 0.11 m/s 11/04/22: .056 m/s  9/10: 076 m/s 10/22: 0.64 m/s  11/26: .076 m/s; 03/20/2023= 0.08 m/s; 04/22/2023= 0.089 m/s; 05/15/2023 = 0.079 m/s; 05/27/2023= 0.11 m/s; 07/17/2023= 0.15 m/s; 08/21/2023= 0.14 m/s avg with Bariatric walker; 09/09/2023= 0.14 m/s; 10/02/2023= 0.09 m/s with Bariatric walker and Increased cues to swing RLE through vs. Previous sesisons. 11/13/2023= 0.12 m/s with Bariatric walker; 12/11/2023= 0.14 m/s; 12/23/2023=0.135  m/s 01/29/24: 3 min 25 sec, 0.05 m/s Goal status: NOT PROGRESSING  4.  Pt will increase hip flexor and abductor  gross strength to 4+/5 bilat as to improve functional strength for independent gait, increased standing tolerance, and increased ADL ability. Baseline: 10/01/22 4+/5, exception hip flexors and abductors 4/5 each bilat  (previously 03/27/22: 4+/5 ) 9/10: Hip flexor 4/5, hip abductor 4+/5 10/22: hip flexor 4/5 hip abductor 4+/5 11/26: hip flexor 4+/5 hip abductor 4+/5 Goal status: MET  5.  Pt will ambulate 150 ft with BRW and WC follow with no rest breaks in less than 12 min to allow for improved mobility within home and increased independence. Baseline: 10/03/22 150' with BRW 04/03/22: 105 ft with BRW 7/29: 100 in 9:30 with pediatric RW 9/10:101 ft with BRW stooped posture worsened throughout test 12/24/2022- 150 feet using BRW, CGA with w/c follow- in 10 min 17 sec. Will keep goal to make sure he can do this consistently 10/22: 119 ft with BRW 8 min 23 seconds terminated by patient  11/26: 148 ft with BRW 14 min 34 seconds terminated by patient 03/20/2023= 152 feet using BRW, CGA and w/c follow in 14 min 3 sec- Need to keep goal active to ensure consistency but will revised and add a time element to goal. 04/22/2023= 162 feet in 11 min 54 sec using bariatric RW, CGA and close CGA assist; 05/27/2023- 170 feet without rest break using bariatric RW. 06/17/2023= 160 feet with bariatric 4WW in 9 min- stopped due to fatigue.  Goal status: MET   6. Pt will decrease TUG to below 1 min 30 seconds/decrease in order to demonstrate decreased fall risk. Baseline: 06/17/2023= 2 min using bariatric 4WW; 09/09/2023= 1 min 50 sec with bariatric front wheeled walker.  12/02/2023= 1 min 44 sec;  12/23/2023= 2 min 8 sec avg with BRW 01/29/24: 3 min  42 sec with BRW Goal status: ONGOING  7. Pt will ambulate 300 ft with BRW, CGA and WC follow with no rest breaks in less than 20 min to allow for improved mobility  within home/community and increased independence. Baseline: 06/17/2023= 160 feet with bariatric 4WW in 9 min- stopped due to fatigue. 07/17/2023- Patient able to walk 270 feet in > 20 min with BRW, CGA and close w/c follow today- VC for posture and for gait sequencing; 08/21/2023- Not specifically testing but patient has been walking 160 feet or more each Visit; 09/09/2023= Patient ambulated 180 feet overall at one time today using bariatric RW, CGA with VC for erect posture and for increased step length.  10/02/2023= 165 feet with Bariatric RW- close w/c follow and CGA. More diffiuclty with gait sequencing more consistent VC for erect posture.  12/02/2023- Paitent ambulated >100 feet in // bars today but will reassess using walker next visit.  12/11/2023= Patient ambulated 204 feet with BRW and Close CGA and w/c follow 01/29/24: Patient ambulated 111 feet with BRW and Close CGA and w/c follow (10 min 59 sec) before needing to rest Goal Status: PROGRESSING  8. Patient will report ability to walk out his front door including stepping over threshold using his walker with min A of caregiver to enable him to be more independent with household mobility and be able to go out to porch with less dependence on caregivers. Baseline: Patient dependent on caregivers to push him in his wheelchair to go out to porch and reports dependent on them to always assist so he doesn't go outside like he would like to. 01/29/24: Pt reports he has not been able to attempt this due to not having a male at home to assist him. Goal status: NEW  ASSESSMENT :  CLINICAL IMPRESSION:   Treatment focused on continued functional mobility with emphasis on taking longer step to make his gait more efficient. He was able to progress some and much improved with retro steps. He was later able to use the standing frame to focus on erect standing- stating feeling the stretch in front of his legs but denied any pain. Will attempt again in some future  visits to improve his overall posture.  Pt will continue to benefit from skilled therapy to address remaining deficits in order to improve overall QoL and return to PLOF.       OBJECTIVE IMPAIRMENTS Abnormal gait, decreased activity tolerance, decreased balance, decreased coordination, decreased endurance, decreased mobility, difficulty walking, decreased ROM, decreased strength, improper body mechanics, postural dysfunction, and obesity.   ACTIVITY LIMITATIONS carrying, lifting, bending, sitting, standing, squatting, stairs, transfers, bed mobility, continence, bathing, toileting, dressing, hygiene/grooming, and locomotion level  PARTICIPATION LIMITATIONS: meal prep, cleaning, laundry, interpersonal relationship, driving, shopping, community activity, and yard work  PERSONAL FACTORS Fitness, Past/current experiences, Time since onset of injury/illness/exacerbation, and 3+ comorbidities: hx of brain tumor, past CVA, HTN, obesity, learning disability, DMII are also affecting patient's functional outcome.   REHAB POTENTIAL: Fair    CLINICAL DECISION MAKING: Stable/uncomplicated  EVALUATION COMPLEXITY: Low  PLAN: PT FREQUENCY: 1-2x/week  PT DURATION: 12 weeks  PLANNED INTERVENTIONS: Therapeutic exercises, Therapeutic activity, Neuromuscular re-education, Balance training, Gait training, Patient/Family education, Joint mobilization, Stair training, Vestibular training, Orthotic/Fit training, DME instructions, Dry Needling, Electrical stimulation, Wheelchair mobility training, Cryotherapy, Moist heat, Compression bandaging, Manual therapy, and Re-evaluation  PLAN FOR NEXT SESSION:   -Bridging and review of some bed LE strengthening -Continue with functional strengthening exercises of the LE's  -Gait activities  using RW. Incorporate proper technique with all transfers to decrease his risk of falling.   Reyes LOISE London PT  Physical Therapist- [facility]   10:26 AM 02/10/24

## 2024-02-12 ENCOUNTER — Ambulatory Visit: Attending: Internal Medicine | Admitting: Physical Therapy

## 2024-02-12 DIAGNOSIS — R262 Difficulty in walking, not elsewhere classified: Secondary | ICD-10-CM | POA: Diagnosis present

## 2024-02-12 DIAGNOSIS — R2681 Unsteadiness on feet: Secondary | ICD-10-CM | POA: Insufficient documentation

## 2024-02-12 DIAGNOSIS — M6281 Muscle weakness (generalized): Secondary | ICD-10-CM | POA: Diagnosis present

## 2024-02-12 DIAGNOSIS — R531 Weakness: Secondary | ICD-10-CM | POA: Insufficient documentation

## 2024-02-12 DIAGNOSIS — R278 Other lack of coordination: Secondary | ICD-10-CM | POA: Diagnosis present

## 2024-02-12 DIAGNOSIS — R2689 Other abnormalities of gait and mobility: Secondary | ICD-10-CM | POA: Diagnosis present

## 2024-02-12 DIAGNOSIS — R296 Repeated falls: Secondary | ICD-10-CM | POA: Insufficient documentation

## 2024-02-12 DIAGNOSIS — R269 Unspecified abnormalities of gait and mobility: Secondary | ICD-10-CM | POA: Insufficient documentation

## 2024-02-12 DIAGNOSIS — I69351 Hemiplegia and hemiparesis following cerebral infarction affecting right dominant side: Secondary | ICD-10-CM | POA: Insufficient documentation

## 2024-02-12 NOTE — Therapy (Unsigned)
 OUTPATIENT PHYSICAL THERAPY TREATMENT  Patient Name: Curtis Powell MRN: [id number] DOB:10-Apr-1965, 58 y.o., male Today's Date: 02/12/2024  PCP: Sherial Bail, MD  REFERRING PROVIDER: Sherial Bail, MD    PT End of Session - 02/12/24 1318     Visit Number [id number]    Number of Visits 130    Date for Recertification  02/24/24    Progress Note Due on Visit 130    PT Start Time 1318    PT Stop Time 1358    PT Time Calculation (min) 40 min    Equipment Utilized During Treatment Gait belt    Activity Tolerance Patient tolerated treatment well    Behavior During Therapy WFL for tasks assessed/performed         Past Medical History:  Diagnosis Date   Asthma    GERD (gastroesophageal reflux disease)    Hyperlipidemia    Hypertension    Obesity    Stroke [facility])    Past Surgical History:  Procedure Laterality Date   BRAIN SURGERY     Patient Active Problem List   Diagnosis Date Noted   Impaired glucose tolerance 03/23/2020   Learning disability 03/23/2020   Urinary and fecal incontinence 06/14/2019   Chronic pain syndrome 03/23/2019   Constipation 03/23/2019   Dysarthria 03/23/2019   Dysphagia 03/23/2019   Learning difficulty 03/23/2019   Morbid obesity (HCC) 03/23/2019   Muscle weakness 03/23/2019   Vitamin D deficiency 03/23/2019   Bilateral carotid artery stenosis 12/15/2018   Moderate aortic valve stenosis 12/15/2018   BMI 45.0-49.9, adult (HCC) 11/10/2018   Cerebrovascular accident (HCC) 05/02/2016   Hemiparesis affecting right side as late effect of cerebrovascular accident (HCC) 05/01/2016   Brain tumor (HCC) 11/24/2014   Gastro-esophageal reflux disease without esophagitis 11/24/2014   Accident due to mechanical fall without injury 08/03/2014   Essential hypertension 06/13/2014   Mixed hyperlipidemia 06/13/2014   Unspecified sequelae of cerebral infarction 06/13/2014   Thyromegaly 01/07/2014   Type 2 diabetes mellitus without complication 01/06/2014    Neck mass 09/08/2013   Swimmer's ear 09/08/2013   Erectile dysfunction 01/03/2013   Prediabetes 01/03/2013   ONSET DATE: 05/09/2016  REFERRING DIAG: I69.351 (ICD-10-CM) - Hemiplegia and hemiparesis following cerebral infarction affecting right dominant side   THERAPY DIAG:  Unsteadiness on feet  Difficulty in walking, not elsewhere classified  Muscle weakness (generalized)  Abnormality of gait and mobility  Left-sided weakness  Repeated falls  Other abnormalities of gait and mobility   Rationale for Evaluation and Treatment Rehabilitation  SUBJECTIVE:  SUBJECTIVE STATEMENT  Pt reports no changes since last visit. Is hoping to get out on patio this weekend.   Pt accompanied by: family member   PERTINENT HISTORY:  The pt is a pleasant 58 yo male who returns to PT for evaluation of deficits s/o old CVA. Pt with hx of CVA Feb 2018. He was previously fully independent, but CVA caused significant deficits, mainly affecting his right side. Pt reports continued difficulty with walking and transfers. Pt still using WC for majority of mobility, otherwise he uses his RW. Pt lives with his mom who is his main caregiver. Patient had outpatient PT previously in clinic in 2023. He reports since discharge he has been completing his HEP and walking about every day. Pt would like to improve his walking mainly, but also would like to work on balance. He also reports difficulty with transfers. Pt's mom and his caregiver helps him with ADLs. However, he says he is now waiting for a new caregiver. Prior to this pt had caregiver 3x/week. PMH significant for hx of brain tumor, past CVA, DMII and HTN. See chart for additional information.  PAIN:  Are you having pain? Pt reports no pain currently  PRECAUTIONS:  fall  WEIGHT BEARING RESTRICTIONS none  PLOF: required assistance with ADLs (old CVA), prior to CVA in 2018 was indep.  History of Falls: Pt reports no recent falls  Home information: Pt lives with mother who is his primary caregiver. Previously had home aide, 3x/week but reports currently waiting for new one.  PATIENT GOALS: Pt would like to improve his ability to ambulate.  OBJECTIVE:  taken at [facility] unless otherwise noted:   DIAGNOSTIC FINDINGS: No recent imaging per chart  Objective measures taken at eval unless otherwise specified:   Posture: forward head posture, rounded shoulders  MMT LE: grossly  4+/5, exception hip flexors and abductors 4/5 each bilat   Functional tests:  5xSTS:  34 sec, use of BUE (multiple initial attempts with posterior LOB in chair)   : 0.096 m/s with RW, close CGA, and WC follow  Gait for distance (goal 150 ft): deferred due to time  Gait mechanics: impaired, decreased speed (see ), crouched posture, poor postural stability and relies heavily on BUE support on RW. Pt now with bilat AFOS   FOTO: 44 (goal 50)   TODAY'S TREATMENT : 02/12/24   Sit<>stand in parallel bars with supervision assist and HEAVY use of BUE due to posterior bias.   Standing with BUE support in parallel bars x 2 min   Gait training  Fwd/retro walking in // bars - back and forth x 4- VC for increased step length with retro steps (Increased time to complete each Lap) counted steps back to reduced steps in each bout  Side stepping to focus on lateral functional mobility for transfers/walking- in // bars down and back x 2 without resting. 2 total rds - VC for increased step width.   Gait with RW x 131ft total with significant increase time but no LOB and heavy use of UE support on RW for weight shift to prevent foot drag with limb advancement.       PATIENT EDUCATION: Education details: If pt can lean pelvis into support surface, his energy efficicency will be  higher.  Person educated: Patient Education method: Explanation, Demonstration, Tactile cues, and Verbal cues Education comprehension: verbalized understanding, returned demonstration, and verbal cues required  HOME EXERCISE PROGRAM:  Standing hip flexor stretch at counter, prolonged standing with erect posture  for gluteal activation, HS curls with RTB  Access Code: [email or web address] URL: [email or web address] Date: 05/15/2023 Prepared by: Connell Kiss  Exercises - Seated Long Arc Quad with Ankle Weight  - 1 x daily - 7 x weekly - 2 sets - 10 reps  GOALS: Goals reviewed with patient? No  SHORT TERM GOALS: Target date: 11/12/2022  Pt will be independent with HEP in order to improve strength and balance in order to decrease fall risk and improve function at home. Baseline: instructed in HEP today, to provide printout next visit Goal status: MET  LONG TERM GOALS: Target date: 02/24/2024  Pt will increase FOTO to at least 50 in order to demonstrate improvement in function related to mobility and QoL. Baseline: 44 7/29:48 9/10:40 12/24/2022= 51 Goal status: MET  2.  Pt (< 60 yrs old) will complete 5xSTS test < 30 seconds indicating an increase in LE strength and improved balance. Baseline: 10/01/22 34 sec (multiple initial attempts with posterior LOB in chair) 12/20: 49 seconds 11/04/22:1:06, min A on multiple attempts to maintain balance and pt stays in flexed posture throughout test when coming to standing.   9/10: 27.5 sec, Pushes WC post consistently, close CGA  10/22: 47 seconds first attempt 12/5: 1 minute, 37 seconds, multiple attempts to get first stand, but no difficulty after that; 03/20/2023= 1 min 2 sec with 1UE on walker and 1 on armrest- some posterior lean- Close CGA; 06/17/2023= 38 sec with 1 UE on walker and 1 on armrest; 08/21/2023= 35 sec with 1UE on walker and 1 on armrest.  8/5: 36 sec with 1 UE on walker 1 on arm rest; 12/23/2023= 37.05 sec with some posterior  LOB- CGA to min A to correct.  01/29/24: 54.77 sec with significant posterior LOB and needing modA to come upright into standing Goal status: ONGOING  3.  Pt will increase to > 0.5 m/s (30 sec) as to improve gait speed for better home ambulation and reduce fall risk. Baseline: 10/01/22 0.096 m/s with RW, close CGA, and WC follow previously 12/27: 0.11 m/s 11/04/22: .056 m/s  9/10: 076 m/s 10/22: 0.64 m/s  11/26: .076 m/s; 03/20/2023= 0.08 m/s; 04/22/2023= 0.089 m/s; 05/15/2023 = 0.079 m/s; 05/27/2023= 0.11 m/s; 07/17/2023= 0.15 m/s; 08/21/2023= 0.14 m/s avg with Bariatric walker; 09/09/2023= 0.14 m/s; 10/02/2023= 0.09 m/s with Bariatric walker and Increased cues to swing RLE through vs. Previous sesisons. 11/13/2023= 0.12 m/s with Bariatric walker; 12/11/2023= 0.14 m/s; 12/23/2023=0.135 m/s 01/29/24: 3 min 25 sec, 0.05 m/s Goal status: NOT PROGRESSING  4.  Pt will increase hip flexor and abductor  gross strength to 4+/5 bilat as to improve functional strength for independent gait, increased standing tolerance, and increased ADL ability. Baseline: 10/01/22 4+/5, exception hip flexors and abductors 4/5 each bilat  (previously 03/27/22: 4+/5 ) 9/10: Hip flexor 4/5, hip abductor 4+/5 10/22: hip flexor 4/5 hip abductor 4+/5 11/26: hip flexor 4+/5 hip abductor 4+/5 Goal status: MET  5.  Pt will ambulate 150 ft with BRW and WC follow with no rest breaks in less than 12 min to allow for improved mobility within home and increased independence. Baseline: 10/03/22 150' with BRW 04/03/22: 105 ft with BRW 7/29: 100 in 9:30 with pediatric RW 9/10:101 ft with BRW stooped posture worsened throughout test 12/24/2022- 150 feet using BRW, CGA with w/c follow- in 10 min 17 sec. Will keep goal to make sure he can do this consistently 10/22: 119 ft with BRW 8 min 23 seconds terminated by patient  11/26:  148 ft with BRW 14 min 34 seconds terminated by patient 03/20/2023= 152 feet using BRW, CGA and w/c follow in 14 min 3 sec-  Need to keep goal active to ensure consistency but will revised and add a time element to goal. 04/22/2023= 162 feet in 11 min 54 sec using bariatric RW, CGA and close CGA assist; 05/27/2023- 170 feet without rest break using bariatric RW. 06/17/2023= 160 feet with bariatric 4WW in 9 min- stopped due to fatigue.  Goal status: MET   6. Pt will decrease TUG to below 1 min 30 seconds/decrease in order to demonstrate decreased fall risk. Baseline: 06/17/2023= 2 min using bariatric 4WW; 09/09/2023= 1 min 50 sec with bariatric front wheeled walker.  12/02/2023= 1 min 44 sec;  12/23/2023= 2 min 8 sec avg with BRW 01/29/24: 3 min 42 sec with BRW Goal status: ONGOING  7. Pt will ambulate 300 ft with BRW, CGA and WC follow with no rest breaks in less than 20 min to allow for improved mobility within home/community and increased independence. Baseline: 06/17/2023= 160 feet with bariatric 4WW in 9 min- stopped due to fatigue. 07/17/2023- Patient able to walk 270 feet in > 20 min with BRW, CGA and close w/c follow today- VC for posture and for gait sequencing; 08/21/2023- Not specifically testing but patient has been walking 160 feet or more each Visit; 09/09/2023= Patient ambulated 180 feet overall at one time today using bariatric RW, CGA with VC for erect posture and for increased step length.  10/02/2023= 165 feet with Bariatric RW- close w/c follow and CGA. More diffiuclty with gait sequencing more consistent VC for erect posture.  12/02/2023- Paitent ambulated >100 feet in // bars today but will reassess using walker next visit.  12/11/2023= Patient ambulated 204 feet with BRW and Close CGA and w/c follow 01/29/24: Patient ambulated 111 feet with BRW and Close CGA and w/c follow (10 min 59 sec) before needing to rest Goal Status: PROGRESSING  8. Patient will report ability to walk out his front door including stepping over threshold using his walker with min A of caregiver to enable him to be more independent with  household mobility and be able to go out to porch with less dependence on caregivers. Baseline: Patient dependent on caregivers to push him in his wheelchair to go out to porch and reports dependent on them to always assist so he doesn't go outside like he would like to. 01/29/24: Pt reports he has not been able to attempt this due to not having a male at home to assist him. Goal status: NEW  ASSESSMENT :  CLINICAL IMPRESSION:   Treatment focused on continued functional mobility with emphasis on taking longer step to make his gait more efficient. He was able to progress some and much improved with retro steps and slightly increased step length with side stepping. Tolerates increased gait distance to 17ft on this date compared to prior session. Reduced foot drag with forward gait.  Pt will continue to benefit from skilled therapy to address remaining deficits in order to improve overall QoL and return to PLOF.       OBJECTIVE IMPAIRMENTS Abnormal gait, decreased activity tolerance, decreased balance, decreased coordination, decreased endurance, decreased mobility, difficulty walking, decreased ROM, decreased strength, improper body mechanics, postural dysfunction, and obesity.   ACTIVITY LIMITATIONS carrying, lifting, bending, sitting, standing, squatting, stairs, transfers, bed mobility, continence, bathing, toileting, dressing, hygiene/grooming, and locomotion level  PARTICIPATION LIMITATIONS: meal prep, cleaning, laundry, interpersonal relationship, driving, shopping, community  activity, and yard work  PERSONAL FACTORS Fitness, Past/current experiences, Time since onset of injury/illness/exacerbation, and 3+ comorbidities: hx of brain tumor, past CVA, HTN, obesity, learning disability, DMII are also affecting patient's functional outcome.   REHAB POTENTIAL: Fair    CLINICAL DECISION MAKING: Stable/uncomplicated  EVALUATION COMPLEXITY: Low  PLAN: PT FREQUENCY: 1-2x/week  PT DURATION:  12 weeks  PLANNED INTERVENTIONS: Therapeutic exercises, Therapeutic activity, Neuromuscular re-education, Balance training, Gait training, Patient/Family education, Joint mobilization, Stair training, Vestibular training, Orthotic/Fit training, DME instructions, Dry Needling, Electrical stimulation, Wheelchair mobility training, Cryotherapy, Moist heat, Compression bandaging, Manual therapy, and Re-evaluation  PLAN FOR NEXT SESSION:   -Bridging and review of some bed LE strengthening -Continue with functional strengthening exercises of the LE's  -Gait activities using RW. Incorporate proper technique with all transfers to decrease his risk of falling.   Massie FORBES Dollar PT  Physical Therapist- [facility]   1:19 PM 02/12/24

## 2024-02-17 ENCOUNTER — Ambulatory Visit

## 2024-02-19 ENCOUNTER — Ambulatory Visit

## 2024-02-19 DIAGNOSIS — R531 Weakness: Secondary | ICD-10-CM

## 2024-02-19 DIAGNOSIS — R269 Unspecified abnormalities of gait and mobility: Secondary | ICD-10-CM

## 2024-02-19 DIAGNOSIS — I69351 Hemiplegia and hemiparesis following cerebral infarction affecting right dominant side: Secondary | ICD-10-CM

## 2024-02-19 DIAGNOSIS — R296 Repeated falls: Secondary | ICD-10-CM

## 2024-02-19 DIAGNOSIS — R2689 Other abnormalities of gait and mobility: Secondary | ICD-10-CM

## 2024-02-19 DIAGNOSIS — M6281 Muscle weakness (generalized): Secondary | ICD-10-CM

## 2024-02-19 DIAGNOSIS — R2681 Unsteadiness on feet: Secondary | ICD-10-CM

## 2024-02-19 DIAGNOSIS — R262 Difficulty in walking, not elsewhere classified: Secondary | ICD-10-CM

## 2024-02-19 DIAGNOSIS — R278 Other lack of coordination: Secondary | ICD-10-CM

## 2024-02-19 NOTE — Therapy (Signed)
 OUTPATIENT PHYSICAL THERAPY TREATMENT  Patient Name: Curtis Powell MRN: [id number] DOB:30-Jun-1965, 58 y.o., male Today's Date: 02/19/2024  PCP: Sherial Bail, MD  REFERRING PROVIDER: Sherial Bail, MD    PT End of Session - 02/19/24 1337     Visit Number 124    Number of Visits 130    Date for Recertification  02/24/24    Progress Note Due on Visit 130    PT Start Time 1325    PT Stop Time 1359    PT Time Calculation (min) 34 min    Equipment Utilized During Treatment Gait belt    Activity Tolerance Patient tolerated treatment well    Behavior During Therapy WFL for tasks assessed/performed          Past Medical History:  Diagnosis Date   Asthma    GERD (gastroesophageal reflux disease)    Hyperlipidemia    Hypertension    Obesity    Stroke [facility])    Past Surgical History:  Procedure Laterality Date   BRAIN SURGERY     Patient Active Problem List   Diagnosis Date Noted   Impaired glucose tolerance 03/23/2020   Learning disability 03/23/2020   Urinary and fecal incontinence 06/14/2019   Chronic pain syndrome 03/23/2019   Constipation 03/23/2019   Dysarthria 03/23/2019   Dysphagia 03/23/2019   Learning difficulty 03/23/2019   Morbid obesity (HCC) 03/23/2019   Muscle weakness 03/23/2019   Vitamin D deficiency 03/23/2019   Bilateral carotid artery stenosis 12/15/2018   Moderate aortic valve stenosis 12/15/2018   BMI 45.0-49.9, adult (HCC) 11/10/2018   Cerebrovascular accident (HCC) 05/02/2016   Hemiparesis affecting right side as late effect of cerebrovascular accident (HCC) 05/01/2016   Brain tumor (HCC) 11/24/2014   Gastro-esophageal reflux disease without esophagitis 11/24/2014   Accident due to mechanical fall without injury 08/03/2014   Essential hypertension 06/13/2014   Mixed hyperlipidemia 06/13/2014   Unspecified sequelae of cerebral infarction 06/13/2014   Thyromegaly 01/07/2014   Type 2 diabetes mellitus without complication  01/06/2014   Neck mass 09/08/2013   Swimmer's ear 09/08/2013   Erectile dysfunction 01/03/2013   Prediabetes 01/03/2013   ONSET DATE: 05/09/2016  REFERRING DIAG: I69.351 (ICD-10-CM) - Hemiplegia and hemiparesis following cerebral infarction affecting right dominant side   THERAPY DIAG:  Unsteadiness on feet  Difficulty in walking, not elsewhere classified  Muscle weakness (generalized)  Abnormality of gait and mobility  Left-sided weakness  Repeated falls  Other abnormalities of gait and mobility  Hemiplegia and hemiparesis following cerebral infarction affecting right dominant side (HCC)  Other lack of coordination   Rationale for Evaluation and Treatment Rehabilitation  SUBJECTIVE:  SUBJECTIVE STATEMENT  Patient reports doing okay- reports did try to walk onto his front porch- made it out but reported difficulty turning and going back in.   Pt accompanied by: family member   PERTINENT HISTORY:  The pt is a pleasant 58 yo male who returns to PT for evaluation of deficits s/o old CVA. Pt with hx of CVA Feb 2018. He was previously fully independent, but CVA caused significant deficits, mainly affecting his right side. Pt reports continued difficulty with walking and transfers. Pt still using WC for majority of mobility, otherwise he uses his RW. Pt lives with his mom who is his main caregiver. Patient had outpatient PT previously in clinic in 2023. He reports since discharge he has been completing his HEP and walking about every day. Pt would like to improve his walking mainly, but also would like to work on balance. He also reports difficulty with transfers. Pt's mom and his caregiver helps him with ADLs. However, he says he is now waiting for a new caregiver. Prior to this pt had caregiver  3x/week. PMH significant for hx of brain tumor, past CVA, DMII and HTN. See chart for additional information.  PAIN:  Are you having pain? Pt reports no pain currently  PRECAUTIONS: fall  WEIGHT BEARING RESTRICTIONS none  PLOF: required assistance with ADLs (old CVA), prior to CVA in 2018 was indep.  History of Falls: Pt reports no recent falls  Home information: Pt lives with mother who is his primary caregiver. Previously had home aide, 3x/week but reports currently waiting for new one.  PATIENT GOALS: Pt would like to improve his ability to ambulate.  OBJECTIVE:  taken at [facility] unless otherwise noted:   DIAGNOSTIC FINDINGS: No recent imaging per chart  Objective measures taken at eval unless otherwise specified:   Posture: forward head posture, rounded shoulders  MMT LE: grossly  4+/5, exception hip flexors and abductors 4/5 each bilat   Functional tests:  5xSTS:  34 sec, use of BUE (multiple initial attempts with posterior LOB in chair)   : 0.096 m/s with RW, close CGA, and WC follow  Gait for distance (goal 150 ft): deferred due to time  Gait mechanics: impaired, decreased speed (see ), crouched posture, poor postural stability and relies heavily on BUE support on RW. Pt now with bilat AFOS   FOTO: 44 (goal 50)   TODAY'S TREATMENT : 02/19/24   Sit<>stand x 5 in parallel bars with supervision assist and HEAVY use of BUE due to posterior bias. (VC for hand placement with sitting from standing position)   Standing without  BUE support in parallel bars x 2.5 min then another 2.5 min with BUE support - Focusing on looking forward in mirror and standing as erect as possible.    Gait training   Forward walking in // bars- down and back x 5 - VC for attempting to increase step length and stand upright  Fwd/retro walking in // bars - back and forth x 4- VC for increased step length with retro steps        PATIENT EDUCATION: Education details: If pt can  lean pelvis into support surface, his energy efficicency will be higher.  Person educated: Patient Education method: Explanation, Demonstration, Tactile cues, and Verbal cues Education comprehension: verbalized understanding, returned demonstration, and verbal cues required  HOME EXERCISE PROGRAM:  Standing hip flexor stretch at counter, prolonged standing with erect posture for gluteal activation, HS curls with RTB  Access Code: [email or web address] URL: [email or web address]  Date: 05/15/2023 Prepared by: Connell Kiss  Exercises - Seated Long Arc Quad with Ankle Weight  - 1 x daily - 7 x weekly - 2 sets - 10 reps  GOALS: Goals reviewed with patient? No  SHORT TERM GOALS: Target date: 11/12/2022  Pt will be independent with HEP in order to improve strength and balance in order to decrease fall risk and improve function at home. Baseline: instructed in HEP today, to provide printout next visit Goal status: MET  LONG TERM GOALS: Target date: 02/24/2024  Pt will increase FOTO to at least 50 in order to demonstrate improvement in function related to mobility and QoL. Baseline: 44 7/29:48 9/10:40 12/24/2022= 51 Goal status: MET  2.  Pt (< 60 yrs old) will complete 5xSTS test < 30 seconds indicating an increase in LE strength and improved balance. Baseline: 10/01/22 34 sec (multiple initial attempts with posterior LOB in chair) 12/20: 49 seconds 11/04/22:1:06, min A on multiple attempts to maintain balance and pt stays in flexed posture throughout test when coming to standing.   9/10: 27.5 sec, Pushes WC post consistently, close CGA  10/22: 47 seconds first attempt 12/5: 1 minute, 37 seconds, multiple attempts to get first stand, but no difficulty after that; 03/20/2023= 1 min 2 sec with 1UE on walker and 1 on armrest- some posterior lean- Close CGA; 06/17/2023= 38 sec with 1 UE on walker and 1 on armrest; 08/21/2023= 35 sec with 1UE on walker and 1 on armrest.  8/5: 36 sec with 1 UE on  walker 1 on arm rest; 12/23/2023= 37.05 sec with some posterior LOB- CGA to min A to correct.  01/29/24: 54.77 sec with significant posterior LOB and needing modA to come upright into standing Goal status: ONGOING  3.  Pt will increase to > 0.5 m/s (30 sec) as to improve gait speed for better home ambulation and reduce fall risk. Baseline: 10/01/22 0.096 m/s with RW, close CGA, and WC follow previously 12/27: 0.11 m/s 11/04/22: .056 m/s  9/10: 076 m/s 10/22: 0.64 m/s  11/26: .076 m/s; 03/20/2023= 0.08 m/s; 04/22/2023= 0.089 m/s; 05/15/2023 = 0.079 m/s; 05/27/2023= 0.11 m/s; 07/17/2023= 0.15 m/s; 08/21/2023= 0.14 m/s avg with Bariatric walker; 09/09/2023= 0.14 m/s; 10/02/2023= 0.09 m/s with Bariatric walker and Increased cues to swing RLE through vs. Previous sesisons. 11/13/2023= 0.12 m/s with Bariatric walker; 12/11/2023= 0.14 m/s; 12/23/2023=0.135 m/s 01/29/24: 3 min 25 sec, 0.05 m/s Goal status: NOT PROGRESSING  4.  Pt will increase hip flexor and abductor  gross strength to 4+/5 bilat as to improve functional strength for independent gait, increased standing tolerance, and increased ADL ability. Baseline: 10/01/22 4+/5, exception hip flexors and abductors 4/5 each bilat  (previously 03/27/22: 4+/5 ) 9/10: Hip flexor 4/5, hip abductor 4+/5 10/22: hip flexor 4/5 hip abductor 4+/5 11/26: hip flexor 4+/5 hip abductor 4+/5 Goal status: MET  5.  Pt will ambulate 150 ft with BRW and WC follow with no rest breaks in less than 12 min to allow for improved mobility within home and increased independence. Baseline: 10/03/22 150' with BRW 04/03/22: 105 ft with BRW 7/29: 100 in 9:30 with pediatric RW 9/10:101 ft with BRW stooped posture worsened throughout test 12/24/2022- 150 feet using BRW, CGA with w/c follow- in 10 min 17 sec. Will keep goal to make sure he can do this consistently 10/22: 119 ft with BRW 8 min 23 seconds terminated by patient  11/26: 148 ft with BRW 14 min 34 seconds terminated by  patient 03/20/2023=  152 feet using BRW, CGA and w/c follow in 14 min 3 sec- Need to keep goal active to ensure consistency but will revised and add a time element to goal. 04/22/2023= 162 feet in 11 min 54 sec using bariatric RW, CGA and close CGA assist; 05/27/2023- 170 feet without rest break using bariatric RW. 06/17/2023= 160 feet with bariatric 4WW in 9 min- stopped due to fatigue.  Goal status: MET   6. Pt will decrease TUG to below 1 min 30 seconds/decrease in order to demonstrate decreased fall risk. Baseline: 06/17/2023= 2 min using bariatric 4WW; 09/09/2023= 1 min 50 sec with bariatric front wheeled walker.  12/02/2023= 1 min 44 sec;  12/23/2023= 2 min 8 sec avg with BRW 01/29/24: 3 min 42 sec with BRW Goal status: ONGOING  7. Pt will ambulate 300 ft with BRW, CGA and WC follow with no rest breaks in less than 20 min to allow for improved mobility within home/community and increased independence. Baseline: 06/17/2023= 160 feet with bariatric 4WW in 9 min- stopped due to fatigue. 07/17/2023- Patient able to walk 270 feet in > 20 min with BRW, CGA and close w/c follow today- VC for posture and for gait sequencing; 08/21/2023- Not specifically testing but patient has been walking 160 feet or more each Visit; 09/09/2023= Patient ambulated 180 feet overall at one time today using bariatric RW, CGA with VC for erect posture and for increased step length.  10/02/2023= 165 feet with Bariatric RW- close w/c follow and CGA. More diffiuclty with gait sequencing more consistent VC for erect posture.  12/02/2023- Paitent ambulated >100 feet in // bars today but will reassess using walker next visit.  12/11/2023= Patient ambulated 204 feet with BRW and Close CGA and w/c follow 01/29/24: Patient ambulated 111 feet with BRW and Close CGA and w/c follow (10 min 59 sec) before needing to rest Goal Status: PROGRESSING  8. Patient will report ability to walk out his front door including stepping over threshold using his  walker with min A of caregiver to enable him to be more independent with household mobility and be able to go out to porch with less dependence on caregivers. Baseline: Patient dependent on caregivers to push him in his wheelchair to go out to porch and reports dependent on them to always assist so he doesn't go outside like he would like to. 01/29/24: Pt reports he has not been able to attempt this due to not having a male at home to assist him. Goal status: NEW  ASSESSMENT :  CLINICAL IMPRESSION:   Treatment limited secondary to patient late arrival (due to transportation issues). He however arrived well motivated to get up and get moving - ambulating 5 x in // bars - down and back without rest. He was able to stand some without UE support with less overall VC for erect posture. He struggled most with trying to increase his step length.  Pt will continue to benefit from skilled therapy to address remaining deficits in order to improve overall QoL and return to PLOF.       OBJECTIVE IMPAIRMENTS Abnormal gait, decreased activity tolerance, decreased balance, decreased coordination, decreased endurance, decreased mobility, difficulty walking, decreased ROM, decreased strength, improper body mechanics, postural dysfunction, and obesity.   ACTIVITY LIMITATIONS carrying, lifting, bending, sitting, standing, squatting, stairs, transfers, bed mobility, continence, bathing, toileting, dressing, hygiene/grooming, and locomotion level  PARTICIPATION LIMITATIONS: meal prep, cleaning, laundry, interpersonal relationship, driving, shopping, community activity, and yard work  PERSONAL [place], Past/current  experiences, Time since onset of injury/illness/exacerbation, and 3+ comorbidities: hx of brain tumor, past CVA, HTN, obesity, learning disability, DMII are also affecting patient's functional outcome.   REHAB POTENTIAL: Fair    CLINICAL DECISION MAKING: Stable/uncomplicated  EVALUATION  COMPLEXITY: Low  PLAN: PT FREQUENCY: 1-2x/week  PT DURATION: 12 weeks  PLANNED INTERVENTIONS: Therapeutic exercises, Therapeutic activity, Neuromuscular re-education, Balance training, Gait training, Patient/Family education, Joint mobilization, Stair training, Vestibular training, Orthotic/Fit training, DME instructions, Dry Needling, Electrical stimulation, Wheelchair mobility training, Cryotherapy, Moist heat, Compression bandaging, Manual therapy, and Re-evaluation  PLAN FOR NEXT SESSION:   -Bridging and review of some bed LE strengthening -Continue with functional strengthening exercises of the LE's  -Gait activities using RW. Incorporate proper technique with all transfers to decrease his risk of falling.   Reyes LOISE London PT  Physical Therapist- [facility]   3:23 PM 02/19/24

## 2024-02-24 ENCOUNTER — Ambulatory Visit

## 2024-02-24 DIAGNOSIS — M6281 Muscle weakness (generalized): Secondary | ICD-10-CM

## 2024-02-24 DIAGNOSIS — R2681 Unsteadiness on feet: Secondary | ICD-10-CM

## 2024-02-24 DIAGNOSIS — I69351 Hemiplegia and hemiparesis following cerebral infarction affecting right dominant side: Secondary | ICD-10-CM

## 2024-02-24 DIAGNOSIS — R296 Repeated falls: Secondary | ICD-10-CM

## 2024-02-24 DIAGNOSIS — R2689 Other abnormalities of gait and mobility: Secondary | ICD-10-CM

## 2024-02-24 DIAGNOSIS — R278 Other lack of coordination: Secondary | ICD-10-CM

## 2024-02-24 DIAGNOSIS — R262 Difficulty in walking, not elsewhere classified: Secondary | ICD-10-CM

## 2024-02-24 DIAGNOSIS — R269 Unspecified abnormalities of gait and mobility: Secondary | ICD-10-CM

## 2024-02-24 NOTE — Therapy (Signed)
 OUTPATIENT PHYSICAL THERAPY TREATMENT  Patient Name: Curtis Powell MRN: [id number] DOB:[birth year], 58 y.o., male Today's Date: 02/25/2024  PCP: Sherial Bail, MD  REFERRING PROVIDER: Sherial Bail, MD    PT End of Session - 02/24/24 1355     Visit Number 125    Number of Visits 130    Date for Recertification  02/24/24    Progress Note Due on Visit 130    PT Start Time 1322    PT Stop Time 1400    PT Time Calculation (min) 38 min    Equipment Utilized During Treatment Gait belt    Activity Tolerance Patient tolerated treatment well    Behavior During Therapy WFL for tasks assessed/performed          Past Medical History:  Diagnosis Date   Asthma    GERD (gastroesophageal reflux disease)    Hyperlipidemia    Hypertension    Obesity    Stroke [facility])    Past Surgical History:  Procedure Laterality Date   BRAIN SURGERY     Patient Active Problem List   Diagnosis Date Noted   Impaired glucose tolerance 03/23/2020   Learning disability 03/23/2020   Urinary and fecal incontinence 06/14/2019   Chronic pain syndrome 03/23/2019   Constipation 03/23/2019   Dysarthria 03/23/2019   Dysphagia 03/23/2019   Learning difficulty 03/23/2019   Morbid obesity (HCC) 03/23/2019   Muscle weakness 03/23/2019   Vitamin D deficiency 03/23/2019   Bilateral carotid artery stenosis 12/15/2018   Moderate aortic valve stenosis 12/15/2018   BMI 45.0-49.9, adult (HCC) 11/10/2018   Cerebrovascular accident (HCC) 05/02/2016   Hemiparesis affecting right side as late effect of cerebrovascular accident (HCC) 05/01/2016   Brain tumor (HCC) 11/24/2014   Gastro-esophageal reflux disease without esophagitis 11/24/2014   Accident due to mechanical fall without injury 08/03/2014   Essential hypertension 06/13/2014   Mixed hyperlipidemia 06/13/2014   Unspecified sequelae of cerebral infarction 06/13/2014   Thyromegaly 01/07/2014   Type 2 diabetes mellitus without complication  01/06/2014   Neck mass 09/08/2013   Swimmer's ear 09/08/2013   Erectile dysfunction 01/03/2013   Prediabetes 01/03/2013   ONSET DATE: 05/09/2016  REFERRING DIAG: I69.351 (ICD-10-CM) - Hemiplegia and hemiparesis following cerebral infarction affecting right dominant side   THERAPY DIAG:  Unsteadiness on feet  Difficulty in walking, not elsewhere classified  Muscle weakness (generalized)  Abnormality of gait and mobility  Repeated falls  Other abnormalities of gait and mobility  Hemiplegia and hemiparesis following cerebral infarction affecting right dominant side (HCC)  Other lack of coordination   Rationale for Evaluation and Treatment Rehabilitation  SUBJECTIVE:  SUBJECTIVE STATEMENT  Patient reports walking as much as he can in home. Denies any falls.   Pt accompanied by: family member   PERTINENT HISTORY:  The pt is a pleasant 58 yo male who returns to PT for evaluation of deficits s/o old CVA. Pt with hx of CVA Feb 2018. He was previously fully independent, but CVA caused significant deficits, mainly affecting his right side. Pt reports continued difficulty with walking and transfers. Pt still using WC for majority of mobility, otherwise he uses his RW. Pt lives with his mom who is his main caregiver. Patient had outpatient PT previously in clinic in 2023. He reports since discharge he has been completing his HEP and walking about every day. Pt would like to improve his walking mainly, but also would like to work on balance. He also reports difficulty with transfers. Pt's mom and his caregiver helps him with ADLs. However, he says he is now waiting for a new caregiver. Prior to this pt had caregiver 3x/week. PMH significant for hx of brain tumor, past CVA, DMII and HTN. See chart for  additional information.  PAIN:  Are you having pain? Pt reports no pain currently  PRECAUTIONS: fall  WEIGHT BEARING RESTRICTIONS none  PLOF: required assistance with ADLs (old CVA), prior to CVA in 2018 was indep.  History of Falls: Pt reports no recent falls  Home information: Pt lives with mother who is his primary caregiver. Previously had home aide, 3x/week but reports currently waiting for new one.  PATIENT GOALS: Pt would like to improve his ability to ambulate.  OBJECTIVE:  taken at [facility] unless otherwise noted:   DIAGNOSTIC FINDINGS: No recent imaging per chart  Objective measures taken at eval unless otherwise specified:   Posture: forward head posture, rounded shoulders  MMT LE: grossly  4+/5, exception hip flexors and abductors 4/5 each bilat   Functional tests:  5xSTS:  34 sec, use of BUE (multiple initial attempts with posterior LOB in chair)   : 0.096 m/s with RW, close CGA, and WC follow  Gait for distance (goal 150 ft): deferred due to time  Gait mechanics: impaired, decreased speed (see ), crouched posture, poor postural stability and relies heavily on BUE support on RW. Pt now with bilat AFOS   FOTO: 44 (goal 50)   TODAY'S TREATMENT : 02/24/24   Sit<>stand x 10 with supervision assist and HEAVY use of BUE due to posterior bias. (VC for hand placement with sitting from standing position)   Standing without  BUE support at support bars x 5 min - much improved erect standing today without excessive forward lean.    Gait training  180 feet today with BRW and CGA (See goals) - Patient ambulated slow - focusing on continuous motion yet continues to take minimal step length with RLE- was able to improve intermittently with VC yet not consistent. He required increased time to complete and very fatigued. He was able to demo much improved overall posture today with much less cues.         PATIENT EDUCATION: Education details: If pt can lean  pelvis into support surface, his energy efficicency will be higher.  Person educated: Patient Education method: Explanation, Demonstration, Tactile cues, and Verbal cues Education comprehension: verbalized understanding, returned demonstration, and verbal cues required  HOME EXERCISE PROGRAM:  Standing hip flexor stretch at counter, prolonged standing with erect posture for gluteal activation, HS curls with RTB  Access Code: [email or web address] URL: [email or web address] Date: 05/15/2023 Prepared by: Connell  Pippin  Exercises - Seated Long Arc Quad with Ankle Weight  - 1 x daily - 7 x weekly - 2 sets - 10 reps  GOALS: Goals reviewed with patient? No  SHORT TERM GOALS: Target date: 11/12/2022  Pt will be independent with HEP in order to improve strength and balance in order to decrease fall risk and improve function at home. Baseline: instructed in HEP today, to provide printout next visit Goal status: MET  LONG TERM GOALS: Target date: 02/24/2024  Pt will increase FOTO to at least 50 in order to demonstrate improvement in function related to mobility and QoL. Baseline: 44 7/29:48 9/10:40 12/24/2022= 51 Goal status: MET  2.  Pt (< 60 yrs old) will complete 5xSTS test < 30 seconds indicating an increase in LE strength and improved balance. Baseline: 10/01/22 34 sec (multiple initial attempts with posterior LOB in chair) 12/20: 49 seconds 11/04/22:1:06, min A on multiple attempts to maintain balance and pt stays in flexed posture throughout test when coming to standing.   9/10: 27.5 sec, Pushes WC post consistently, close CGA  10/22: 47 seconds first attempt 12/5: 1 minute, 37 seconds, multiple attempts to get first stand, but no difficulty after that; 03/20/2023= 1 min 2 sec with 1UE on walker and 1 on armrest- some posterior lean- Close CGA; 06/17/2023= 38 sec with 1 UE on walker and 1 on armrest; 08/21/2023= 35 sec with 1UE on walker and 1 on armrest.  8/5: 36 sec with 1 UE on walker  1 on arm rest; 12/23/2023= 37.05 sec with some posterior LOB- CGA to min A to correct.  01/29/24: 54.77 sec with significant posterior LOB and needing modA to come upright into standing Goal status: ONGOING  3.  Pt will increase to > 0.5 m/s (30 sec) as to improve gait speed for better home ambulation and reduce fall risk. Baseline: 10/01/22 0.096 m/s with RW, close CGA, and WC follow previously 12/27: 0.11 m/s 11/04/22: .056 m/s  9/10: 076 m/s 10/22: 0.64 m/s  11/26: .076 m/s; 03/20/2023= 0.08 m/s; 04/22/2023= 0.089 m/s; 05/15/2023 = 0.079 m/s; 05/27/2023= 0.11 m/s; 07/17/2023= 0.15 m/s; 08/21/2023= 0.14 m/s avg with Bariatric walker; 09/09/2023= 0.14 m/s; 10/02/2023= 0.09 m/s with Bariatric walker and Increased cues to swing RLE through vs. Previous sesisons. 11/13/2023= 0.12 m/s with Bariatric walker; 12/11/2023= 0.14 m/s; 12/23/2023=0.135 m/s 01/29/24: 3 min 25 sec, 0.05 m/s Goal status: NOT PROGRESSING  4.  Pt will increase hip flexor and abductor  gross strength to 4+/5 bilat as to improve functional strength for independent gait, increased standing tolerance, and increased ADL ability. Baseline: 10/01/22 4+/5, exception hip flexors and abductors 4/5 each bilat  (previously 03/27/22: 4+/5 ) 9/10: Hip flexor 4/5, hip abductor 4+/5 10/22: hip flexor 4/5 hip abductor 4+/5 11/26: hip flexor 4+/5 hip abductor 4+/5 Goal status: MET  5.  Pt will ambulate 150 ft with BRW and WC follow with no rest breaks in less than 12 min to allow for improved mobility within home and increased independence. Baseline: 10/03/22 150' with BRW 04/03/22: 105 ft with BRW 7/29: 100 in 9:30 with pediatric RW 9/10:101 ft with BRW stooped posture worsened throughout test 12/24/2022- 150 feet using BRW, CGA with w/c follow- in 10 min 17 sec. Will keep goal to make sure he can do this consistently 10/22: 119 ft with BRW 8 min 23 seconds terminated by patient  11/26: 148 ft with BRW 14 min 34 seconds terminated by patient 03/20/2023=  152 feet using BRW, CGA  and w/c follow in 14 min 3 sec- Need to keep goal active to ensure consistency but will revised and add a time element to goal. 04/22/2023= 162 feet in 11 min 54 sec using bariatric RW, CGA and close CGA assist; 05/27/2023- 170 feet without rest break using bariatric RW. 06/17/2023= 160 feet with bariatric 4WW in 9 min- stopped due to fatigue.  Goal status: MET   6. Pt will decrease TUG to below 1 min 30 seconds/decrease in order to demonstrate decreased fall risk. Baseline: 06/17/2023= 2 min using bariatric 4WW; 09/09/2023= 1 min 50 sec with bariatric front wheeled walker.  12/02/2023= 1 min 44 sec;  12/23/2023= 2 min 8 sec avg with BRW 01/29/24: 3 min 42 sec with BRW Goal status: ONGOING  7. Pt will ambulate 300 ft with BRW, CGA and WC follow with no rest breaks in less than 20 min to allow for improved mobility within home/community and increased independence. Baseline: 06/17/2023= 160 feet with bariatric 4WW in 9 min- stopped due to fatigue. 07/17/2023- Patient able to walk 270 feet in > 20 min with BRW, CGA and close w/c follow today- VC for posture and for gait sequencing; 08/21/2023- Not specifically testing but patient has been walking 160 feet or more each Visit; 09/09/2023= Patient ambulated 180 feet overall at one time today using bariatric RW, CGA with VC for erect posture and for increased step length.  10/02/2023= 165 feet with Bariatric RW- close w/c follow and CGA. More diffiuclty with gait sequencing more consistent VC for erect posture.  12/02/2023- Paitent ambulated >100 feet in // bars today but will reassess using walker next visit.  12/11/2023= Patient ambulated 204 feet with BRW and Close CGA and w/c follow 01/29/24: Patient ambulated 111 feet with BRW and Close CGA and w/c follow (10 min 59 sec) before needing to rest 02/24/2024= Patient ambulated 180 feet with BRW and Close CGA.  Goal Status: PROGRESSING  8. Patient will report ability to walk out his front door  including stepping over threshold using his walker with min A of caregiver to enable him to be more independent with household mobility and be able to go out to porch with less dependence on caregivers. Baseline: Patient dependent on caregivers to push him in his wheelchair to go out to porch and reports dependent on them to always assist so he doesn't go outside like he would like to. 01/29/24: Pt reports he has not been able to attempt this due to not having a male at home to assist him. 02/24/2024: Patient reports has been able to step outside on his porch a couple of time with use of walker and close caregiver support.  Goal status: MET  ASSESSMENT :  CLINICAL IMPRESSION:   Treatment limited secondary to patient late arrival (due to transportation issues) again today. Will perform Recert next visit when have a full session. Did reassess gait distance goal- Patient ambulated more than he has in recent past and his distance is trending back up since recent decline during this certification. He required less VC overall for posture but continues to require some reminders to take a longer step with RLE.  Patient did report he has been able to go out to porch a couple of times with caregiver with using his walker so that goal was met. Pt will continue to benefit from skilled therapy to address remaining deficits in order to improve overall QoL and return to PLOF.       OBJECTIVE IMPAIRMENTS Abnormal gait,  decreased activity tolerance, decreased balance, decreased coordination, decreased endurance, decreased mobility, difficulty walking, decreased ROM, decreased strength, improper body mechanics, postural dysfunction, and obesity.   ACTIVITY LIMITATIONS carrying, lifting, bending, sitting, standing, squatting, stairs, transfers, bed mobility, continence, bathing, toileting, dressing, hygiene/grooming, and locomotion level  PARTICIPATION LIMITATIONS: meal prep, cleaning, laundry, interpersonal  relationship, driving, shopping, community activity, and yard work  PERSONAL FACTORS Fitness, Past/current experiences, Time since onset of injury/illness/exacerbation, and 3+ comorbidities: hx of brain tumor, past CVA, HTN, obesity, learning disability, DMII are also affecting patient's functional outcome.   REHAB POTENTIAL: Fair    CLINICAL DECISION MAKING: Stable/uncomplicated  EVALUATION COMPLEXITY: Low  PLAN: PT FREQUENCY: 1-2x/week  PT DURATION: 12 weeks  PLANNED INTERVENTIONS: Therapeutic exercises, Therapeutic activity, Neuromuscular re-education, Balance training, Gait training, Patient/Family education, Joint mobilization, Stair training, Vestibular training, Orthotic/Fit training, DME instructions, Dry Needling, Electrical stimulation, Wheelchair mobility training, Cryotherapy, Moist heat, Compression bandaging, Manual therapy, and Re-evaluation  PLAN FOR NEXT SESSION:   -Bridging and review of some bed LE strengthening -Continue with functional strengthening exercises of the LE's  -Gait activities using RW. Incorporate proper technique with all transfers to decrease his risk of falling.   Reyes LOISE London PT  Physical Therapist- [facility]   8:29 AM 02/25/24

## 2024-02-26 ENCOUNTER — Ambulatory Visit

## 2024-02-26 DIAGNOSIS — R296 Repeated falls: Secondary | ICD-10-CM

## 2024-02-26 DIAGNOSIS — R278 Other lack of coordination: Secondary | ICD-10-CM

## 2024-02-26 DIAGNOSIS — R2681 Unsteadiness on feet: Secondary | ICD-10-CM

## 2024-02-26 DIAGNOSIS — R2689 Other abnormalities of gait and mobility: Secondary | ICD-10-CM

## 2024-02-26 DIAGNOSIS — I69351 Hemiplegia and hemiparesis following cerebral infarction affecting right dominant side: Secondary | ICD-10-CM

## 2024-02-26 DIAGNOSIS — R269 Unspecified abnormalities of gait and mobility: Secondary | ICD-10-CM

## 2024-02-26 DIAGNOSIS — M6281 Muscle weakness (generalized): Secondary | ICD-10-CM

## 2024-02-26 DIAGNOSIS — R262 Difficulty in walking, not elsewhere classified: Secondary | ICD-10-CM

## 2024-02-26 NOTE — Therapy (Signed)
 OUTPATIENT PHYSICAL THERAPY TREATMENT/RECERT  Patient Name: Curtis Powell MRN: [id number] DOB:26-Dec-1965, 58 y.o., male Today's Date: 02/27/2024  PCP: Sherial Bail, MD  REFERRING PROVIDER: Sherial Bail, MD    PT End of Session - 02/26/24 1308     Visit Number 126    Number of Visits 150    Date for Recertification  05/20/24    Progress Note Due on Visit 130    PT Start Time 1314    PT Stop Time 1359    PT Time Calculation (min) 45 min    Equipment Utilized During Treatment Gait belt    Activity Tolerance Patient tolerated treatment well    Behavior During Therapy WFL for tasks assessed/performed           Past Medical History:  Diagnosis Date   Asthma    GERD (gastroesophageal reflux disease)    Hyperlipidemia    Hypertension    Obesity    Stroke [facility])    Past Surgical History:  Procedure Laterality Date   BRAIN SURGERY     Patient Active Problem List   Diagnosis Date Noted   Impaired glucose tolerance 03/23/2020   Learning disability 03/23/2020   Urinary and fecal incontinence 06/14/2019   Chronic pain syndrome 03/23/2019   Constipation 03/23/2019   Dysarthria 03/23/2019   Dysphagia 03/23/2019   Learning difficulty 03/23/2019   Morbid obesity (HCC) 03/23/2019   Muscle weakness 03/23/2019   Vitamin D deficiency 03/23/2019   Bilateral carotid artery stenosis 12/15/2018   Moderate aortic valve stenosis 12/15/2018   BMI 45.0-49.9, adult (HCC) 11/10/2018   Cerebrovascular accident (HCC) 05/02/2016   Hemiparesis affecting right side as late effect of cerebrovascular accident (HCC) 05/01/2016   Brain tumor (HCC) 11/24/2014   Gastro-esophageal reflux disease without esophagitis 11/24/2014   Accident due to mechanical fall without injury 08/03/2014   Essential hypertension 06/13/2014   Mixed hyperlipidemia 06/13/2014   Unspecified sequelae of cerebral infarction 06/13/2014   Thyromegaly 01/07/2014   Type 2 diabetes mellitus without complication  01/06/2014   Neck mass 09/08/2013   Swimmer's ear 09/08/2013   Erectile dysfunction 01/03/2013   Prediabetes 01/03/2013   ONSET DATE: 05/09/2016  REFERRING DIAG: I69.351 (ICD-10-CM) - Hemiplegia and hemiparesis following cerebral infarction affecting right dominant side   THERAPY DIAG:  Unsteadiness on feet  Difficulty in walking, not elsewhere classified  Muscle weakness (generalized)  Abnormality of gait and mobility  Repeated falls  Other abnormalities of gait and mobility  Hemiplegia and hemiparesis following cerebral infarction affecting right dominant side (HCC)  Other lack of coordination   Rationale for Evaluation and Treatment Rehabilitation  SUBJECTIVE:  SUBJECTIVE STATEMENT  Patient reports no new issues. Working on moving around the home better.    Pt accompanied by: family member   PERTINENT HISTORY:  The pt is a pleasant 58 yo male who returns to PT for evaluation of deficits s/o old CVA. Pt with hx of CVA Feb 2018. He was previously fully independent, but CVA caused significant deficits, mainly affecting his right side. Pt reports continued difficulty with walking and transfers. Pt still using WC for majority of mobility, otherwise he uses his RW. Pt lives with his mom who is his main caregiver. Patient had outpatient PT previously in clinic in 2023. He reports since discharge he has been completing his HEP and walking about every day. Pt would like to improve his walking mainly, but also would like to work on balance. He also reports difficulty with transfers. Pt's mom and his caregiver helps him with ADLs. However, he says he is now waiting for a new caregiver. Prior to this pt had caregiver 3x/week. PMH significant for hx of brain tumor, past CVA, DMII and HTN. See chart for  additional information.  PAIN:  Are you having pain? Pt reports no pain currently  PRECAUTIONS: fall  WEIGHT BEARING RESTRICTIONS none  PLOF: required assistance with ADLs (old CVA), prior to CVA in 2018 was indep.  History of Falls: Pt reports no recent falls  Home information: Pt lives with mother who is his primary caregiver. Previously had home aide, 3x/week but reports currently waiting for new one.  PATIENT GOALS: Pt would like to improve his ability to ambulate.  OBJECTIVE:  taken at [facility] unless otherwise noted:   DIAGNOSTIC FINDINGS: No recent imaging per chart  Objective measures taken at eval unless otherwise specified:   Posture: forward head posture, rounded shoulders  MMT LE: grossly  4+/5, exception hip flexors and abductors 4/5 each bilat   Functional tests:  5xSTS:  34 sec, use of BUE (multiple initial attempts with posterior LOB in chair)   : 0.096 m/s with RW, close CGA, and WC follow  Gait for distance (goal 150 ft): deferred due to time  Gait mechanics: impaired, decreased speed (see ), crouched posture, poor postural stability and relies heavily on BUE support on RW. Pt now with bilat AFOS   FOTO: 44 (goal 50)   TODAY'S TREATMENT : 02/26/24   Physical therapy treatment session today consisted of completing assessment of goals and administration of testing as demonstrated and documented in flow sheet, treatment, and goals section of this note. Addition treatments may be found below.   Pt performed 5 time sit<>stand (5xSTS): 46 sec with some posterior LOB on 2 out of 5 stands- min A to recover and stand sec (>15 sec indicates increased fall risk)    10 Meter Walk Test: Patient instructed to walk 10 meters (32.8 ft) as quickly and as safely as possible at their normal speed x2 and at a fast speed x2. Time measured from 2 meter mark to 8 meter mark to accommodate ramp-up and ramp-down.  Normal speed 1: 13 sec or 0.14 m/s Normal speed 2:  10 sec of 0.14 m/s Average Normal speed: 0.14 m/s Cut off scores: <0.4 m/s = household Ambulator, 0.4-0.8 m/s = limited community Ambulator, >0.8 m/s = community Ambulator, >1.2 m/s = crossing a street, <1.0 = increased fall risk MCID 0.05 m/s (small), 0.13 m/s (moderate), 0.06 m/s (significant)  (ANPTA Core Set of Outcome Measures for Adults with Neurologic Conditions, 2018)   PT  instructed pt in TUG: 1 min 58 sec  (AVG OF 3 TRIALS) with BRW and max VC for turning. sec (average of 3 trials; >13.5 sec indicates increased fall risk)     Standing without  BUE support at support bars x 2.5 min  x 2 with some posterior lean  Self care home management:  Discussed sitting therex for LE strengthening Discussed importance of standing at sink with and without UE focusing on standing endurance- working his core/low back and LE     PATIENT EDUCATION: Education details: If pt can lean pelvis into support surface, his energy efficicency will be higher.  Person educated: Patient Education method: Explanation, Demonstration, Tactile cues, and Verbal cues Education comprehension: verbalized understanding, returned demonstration, and verbal cues required  HOME EXERCISE PROGRAM:  Standing hip flexor stretch at counter, prolonged standing with erect posture for gluteal activation, HS curls with RTB  Access Code: [email or web address] URL: [email or web address] Date: 05/15/2023 Prepared by: Connell Kiss  Exercises - Seated Long Arc Quad with Ankle Weight  - 1 x daily - 7 x weekly - 2 sets - 10 reps  GOALS: Goals reviewed with patient? No  SHORT TERM GOALS: Target date: 11/12/2022  Pt will be independent with HEP in order to improve strength and balance in order to decrease fall risk and improve function at home. Baseline: instructed in HEP today, to provide printout next visit Goal status: MET  LONG TERM GOALS: Target date: 02/24/2024  Pt will increase FOTO to at least 50 in order to  demonstrate improvement in function related to mobility and QoL. Baseline: 44 7/29:48 9/10:40 12/24/2022= 51 Goal status: MET  2.  Pt (< 60 yrs old) will complete 5xSTS test < 30 seconds indicating an increase in LE strength and improved balance. Baseline: 10/01/22 34 sec (multiple initial attempts with posterior LOB in chair) 12/20: 49 seconds 11/04/22:1:06, min A on multiple attempts to maintain balance and pt stays in flexed posture throughout test when coming to standing.   9/10: 27.5 sec, Pushes WC post consistently, close CGA  10/22: 47 seconds first attempt 12/5: 1 minute, 37 seconds, multiple attempts to get first stand, but no difficulty after that; 03/20/2023= 1 min 2 sec with 1UE on walker and 1 on armrest- some posterior lean- Close CGA; 06/17/2023= 38 sec with 1 UE on walker and 1 on armrest; 08/21/2023= 35 sec with 1UE on walker and 1 on armrest.  8/5: 36 sec with 1 UE on walker 1 on arm rest; 12/23/2023= 37.05 sec with some posterior LOB- CGA to min A to correct.  01/29/24: 54.77 sec with significant posterior LOB and needing modA to come upright into standing 02/26/24: 46 sec with some posterior LOB on 2 out of 5 stands- min A to recover and stand Goal status: ONGOING  3.  Pt will increase to > 0.5 m/s (30 sec) as to improve gait speed for better home ambulation and reduce fall risk. Baseline: 10/01/22 0.096 m/s with RW, close CGA, and WC follow previously 12/27: 0.11 m/s 11/04/22: .056 m/s  9/10: 076 m/s 10/22: 0.64 m/s  11/26: .076 m/s; 03/20/2023= 0.08 m/s; 04/22/2023= 0.089 m/s; 05/15/2023 = 0.079 m/s; 05/27/2023= 0.11 m/s; 07/17/2023= 0.15 m/s; 08/21/2023= 0.14 m/s avg with Bariatric walker; 09/09/2023= 0.14 m/s; 10/02/2023= 0.09 m/s with Bariatric walker and Increased cues to swing RLE through vs. Previous sesisons. 11/13/2023= 0.12 m/s with Bariatric walker; 12/11/2023= 0.14 m/s; 12/23/2023=0.135 m/s 01/29/24: 3 min 25 sec, 0.05 m/s 02/26/2024: 0.14 m/s avg with BRW  Goal status: NOT  PROGRESSING  4.  Pt will increase hip flexor and abductor  gross strength to 4+/5 bilat as to improve functional strength for independent gait, increased standing tolerance, and increased ADL ability. Baseline: 10/01/22 4+/5, exception hip flexors and abductors 4/5 each bilat  (previously 03/27/22: 4+/5 ) 9/10: Hip flexor 4/5, hip abductor 4+/5 10/22: hip flexor 4/5 hip abductor 4+/5 11/26: hip flexor 4+/5 hip abductor 4+/5 Goal status: MET  5.  Pt will ambulate 150 ft with BRW and WC follow with no rest breaks in less than 12 min to allow for improved mobility within home and increased independence. Baseline: 10/03/22 150' with BRW 04/03/22: 105 ft with BRW 7/29: 100 in 9:30 with pediatric RW 9/10:101 ft with BRW stooped posture worsened throughout test 12/24/2022- 150 feet using BRW, CGA with w/c follow- in 10 min 17 sec. Will keep goal to make sure he can do this consistently 10/22: 119 ft with BRW 8 min 23 seconds terminated by patient  11/26: 148 ft with BRW 14 min 34 seconds terminated by patient 03/20/2023= 152 feet using BRW, CGA and w/c follow in 14 min 3 sec- Need to keep goal active to ensure consistency but will revised and add a time element to goal. 04/22/2023= 162 feet in 11 min 54 sec using bariatric RW, CGA and close CGA assist; 05/27/2023- 170 feet without rest break using bariatric RW. 06/17/2023= 160 feet with bariatric 4WW in 9 min- stopped due to fatigue.  Goal status: MET   6. Pt will decrease TUG to below 1 min 30 seconds/decrease in order to demonstrate decreased fall risk. Baseline: 06/17/2023= 2 min using bariatric 4WW; 09/09/2023= 1 min 50 sec with bariatric front wheeled walker.  12/02/2023= 1 min 44 sec;  12/23/2023= 2 min 8 sec avg with BRW 01/29/24: 3 min 42 sec with BRW 02/26/24: 1 min 58 sec with BRW and max VC for turning. Goal status: PROGRESSING   7. Pt will ambulate 300 ft with BRW, CGA and WC follow with no rest breaks in less than 20 min to allow for improved  mobility within home/community and increased independence. Baseline: 06/17/2023= 160 feet with bariatric 4WW in 9 min- stopped due to fatigue. 07/17/2023- Patient able to walk 270 feet in > 20 min with BRW, CGA and close w/c follow today- VC for posture and for gait sequencing; 08/21/2023- Not specifically testing but patient has been walking 160 feet or more each Visit; 09/09/2023= Patient ambulated 180 feet overall at one time today using bariatric RW, CGA with VC for erect posture and for increased step length.  10/02/2023= 165 feet with Bariatric RW- close w/c follow and CGA. More diffiuclty with gait sequencing more consistent VC for erect posture.  12/02/2023- Paitent ambulated >100 feet in // bars today but will reassess using walker next visit.  12/11/2023= Patient ambulated 204 feet with BRW and Close CGA and w/c follow 01/29/24: Patient ambulated 111 feet with BRW and Close CGA and w/c follow (10 min 59 sec) before needing to rest 02/24/2024= Patient ambulated 180 feet with BRW and Close CGA.  Goal Status: PROGRESSING  8. Patient will report ability to walk out his front door including stepping over threshold using his walker with min A of caregiver to enable him to be more independent with household mobility and be able to go out to porch with less dependence on caregivers. Baseline: Patient dependent on caregivers to push him in his wheelchair to go out to porch and reports dependent  on them to always assist so he doesn't go outside like he would like to. 01/29/24: Pt reports he has not been able to attempt this due to not having a male at home to assist him. 02/24/2024: Patient reports has been able to step outside on his porch a couple of time with use of walker and close caregiver support.  Goal status: MET  9. Patient will stand at kitchen counter or support bar for > 5 min without UE support to demonstrate improved standing posture/endurance/LE strength with standing ADLs to assist with  toileting, changing clothes, or light kitchen activities.  Baseline: Patient able to stand approx 2.5 min without posterior lean and no UE support.  Goal status: NEW  ASSESSMENT :  CLINICAL IMPRESSION:   Patient presents for recert visit today and presents with excellent motivation. He was tested in some of his ongoing mobility goals and able to demonstrate good progress overall and specifically after last progress report values. He was able to reproduce improved 5 x sit to stand with ongoing difficulty with posterior lean but overall more power in LE's. He was able to demonstrate improved overall gait speed - 2nd faster in past year and improved turning with TUG - presenting with decreased risk of falling. Patient's condition has the potential to improve in response to therapy. Maximum improvement is yet to be obtained. The anticipated improvement is attainable and reasonable in a generally predictable time. Pt will continue to benefit from skilled therapy to address remaining deficits in order to improve overall QoL and return to PLOF.       OBJECTIVE IMPAIRMENTS Abnormal gait, decreased activity tolerance, decreased balance, decreased coordination, decreased endurance, decreased mobility, difficulty walking, decreased ROM, decreased strength, improper body mechanics, postural dysfunction, and obesity.   ACTIVITY LIMITATIONS carrying, lifting, bending, sitting, standing, squatting, stairs, transfers, bed mobility, continence, bathing, toileting, dressing, hygiene/grooming, and locomotion level  PARTICIPATION LIMITATIONS: meal prep, cleaning, laundry, interpersonal relationship, driving, shopping, community activity, and yard work  PERSONAL FACTORS Fitness, Past/current experiences, Time since onset of injury/illness/exacerbation, and 3+ comorbidities: hx of brain tumor, past CVA, HTN, obesity, learning disability, DMII are also affecting patient's functional outcome.   REHAB POTENTIAL: Fair     CLINICAL DECISION MAKING: Stable/uncomplicated  EVALUATION COMPLEXITY: Low  PLAN: PT FREQUENCY: 1-2x/week  PT DURATION: 12 weeks  PLANNED INTERVENTIONS: Therapeutic exercises, Therapeutic activity, Neuromuscular re-education, Balance training, Gait training, Patient/Family education, Joint mobilization, Stair training, Vestibular training, Orthotic/Fit training, DME instructions, Dry Needling, Electrical stimulation, Wheelchair mobility training, Cryotherapy, Moist heat, Compression bandaging, Manual therapy, and Re-evaluation  PLAN FOR NEXT SESSION:   -Bridging and review of some bed LE strengthening -Continue with functional strengthening exercises of the LE's  -Gait activities using RW. Incorporate proper technique with all transfers to decrease his risk of falling.   Reyes LOISE London PT  Physical Therapist- [place]   9:45 AM 02/27/24

## 2024-03-02 ENCOUNTER — Ambulatory Visit

## 2024-03-08 NOTE — Therapy (Signed)
 OUTPATIENT PHYSICAL THERAPY TREATMENT  Patient Name: Curtis Powell MRN: [id number] DOB:[birth year], 58 y.o., male Today's Date: 03/09/2024  PCP: Sherial Bail, MD  REFERRING PROVIDER: Sherial Bail, MD    PT End of Session - 03/09/24 1321     Visit Number 127    Number of Visits 150    Date for Recertification  05/20/24    Progress Note Due on Visit 130    PT Start Time 1316    PT Stop Time 1359    PT Time Calculation (min) 43 min    Equipment Utilized During Treatment Gait belt    Activity Tolerance Patient tolerated treatment well    Behavior During Therapy WFL for tasks assessed/performed            Past Medical History:  Diagnosis Date   Asthma    GERD (gastroesophageal reflux disease)    Hyperlipidemia    Hypertension    Obesity    Stroke [facility])    Past Surgical History:  Procedure Laterality Date   BRAIN SURGERY     Patient Active Problem List   Diagnosis Date Noted   Impaired glucose tolerance 03/23/2020   Learning disability 03/23/2020   Urinary and fecal incontinence 06/14/2019   Chronic pain syndrome 03/23/2019   Constipation 03/23/2019   Dysarthria 03/23/2019   Dysphagia 03/23/2019   Learning difficulty 03/23/2019   Morbid obesity (HCC) 03/23/2019   Muscle weakness 03/23/2019   Vitamin D deficiency 03/23/2019   Bilateral carotid artery stenosis 12/15/2018   Moderate aortic valve stenosis 12/15/2018   BMI 45.0-49.9, adult (HCC) 11/10/2018   Cerebrovascular accident (HCC) 05/02/2016   Hemiparesis affecting right side as late effect of cerebrovascular accident (HCC) 05/01/2016   Brain tumor (HCC) 11/24/2014   Gastro-esophageal reflux disease without esophagitis 11/24/2014   Accident due to mechanical fall without injury 08/03/2014   Essential hypertension 06/13/2014   Mixed hyperlipidemia 06/13/2014   Unspecified sequelae of cerebral infarction 06/13/2014   Thyromegaly 01/07/2014   Type 2 diabetes mellitus without complication  01/06/2014   Neck mass 09/08/2013   Swimmer's ear 09/08/2013   Erectile dysfunction 01/03/2013   Prediabetes 01/03/2013   ONSET DATE: 05/09/2016  REFERRING DIAG: I69.351 (ICD-10-CM) - Hemiplegia and hemiparesis following cerebral infarction affecting right dominant side   THERAPY DIAG:  Unsteadiness on feet  Difficulty in walking, not elsewhere classified  Muscle weakness (generalized)  Abnormality of gait and mobility  Repeated falls  Other abnormalities of gait and mobility  Hemiplegia and hemiparesis following cerebral infarction affecting right dominant side (HCC)  Other lack of coordination  Left-sided weakness  Acute CVA (cerebrovascular accident) (HCC)   Rationale for Evaluation and Treatment Rehabilitation  SUBJECTIVE:  SUBJECTIVE STATEMENT   Patient reports had a good Thanksgiving with no new issues. States he ate all he wanted - watched the ballgames and walked everyday.     Pt accompanied by: family member   PERTINENT HISTORY:  The pt is a pleasant 58 yo male who returns to PT for evaluation of deficits s/o old CVA. Pt with hx of CVA Feb 2018. He was previously fully independent, but CVA caused significant deficits, mainly affecting his right side. Pt reports continued difficulty with walking and transfers. Pt still using WC for majority of mobility, otherwise he uses his RW. Pt lives with his mom who is his main caregiver. Patient had outpatient PT previously in clinic in 2023. He reports since discharge he has been completing his HEP and walking about every day. Pt would like to improve his walking mainly, but also would like to work on balance. He also reports difficulty with transfers. Pt's mom and his caregiver helps him with ADLs. However, he says he is now waiting for a new  caregiver. Prior to this pt had caregiver 3x/week. PMH significant for hx of brain tumor, past CVA, DMII and HTN. See chart for additional information.  PAIN:  Are you having pain? Pt reports no pain currently  PRECAUTIONS: fall  WEIGHT BEARING RESTRICTIONS none  PLOF: required assistance with ADLs (old CVA), prior to CVA in 2018 was indep.  History of Falls: Pt reports no recent falls  Home information: Pt lives with mother who is his primary caregiver. Previously had home aide, 3x/week but reports currently waiting for new one.  PATIENT GOALS: Pt would like to improve his ability to ambulate.  OBJECTIVE:  taken at [facility] unless otherwise noted:   DIAGNOSTIC FINDINGS: No recent imaging per chart  Objective measures taken at eval unless otherwise specified:   Posture: forward head posture, rounded shoulders  MMT LE: grossly  4+/5, exception hip flexors and abductors 4/5 each bilat   Functional tests:  5xSTS:  34 sec, use of BUE (multiple initial attempts with posterior LOB in chair)   : 0.096 m/s with RW, close CGA, and WC follow  Gait for distance (goal 150 ft): deferred due to time  Gait mechanics: impaired, decreased speed (see ), crouched posture, poor postural stability and relies heavily on BUE support on RW. Pt now with bilat AFOS   FOTO: 44 (goal 50)   TODAY'S TREATMENT : 03/09/24   TA: Sit to stand with 1 UE on support bar and 1 on armrest- 2 x 10 reps  Side stepping along support bar L-R x 6 feet x 12 ea direction with BUE Support Step tap onto 2in block 2 x 10   NMR: Standing without  BUE support at support bars x 1 min then switched to resting BUE on support bar- then raising 1 UE at a time and tap on dry erase board  x 20 reps with some posterior lean= performed 4 rounds total  Static stand without UE support x 4 min without UE support - posterior LOB with constant cues to shift hips/upper trunk forward.        PATIENT EDUCATION: Education  details: If pt can lean pelvis into support surface, his energy efficicency will be higher.  Person educated: Patient Education method: Explanation, Demonstration, Tactile cues, and Verbal cues Education comprehension: verbalized understanding, returned demonstration, and verbal cues required  HOME EXERCISE PROGRAM:  Standing hip flexor stretch at counter, prolonged standing with erect posture for gluteal activation, HS curls with  RTB  Access Code: [email or web address] URL: [email or web address] Date: 05/15/2023 Prepared by: Connell Kiss  Exercises - Seated Long Arc Quad with Ankle Weight  - 1 x daily - 7 x weekly - 2 sets - 10 reps  GOALS: Goals reviewed with patient? No  SHORT TERM GOALS: Target date: 11/12/2022  Pt will be independent with HEP in order to improve strength and balance in order to decrease fall risk and improve function at home. Baseline: instructed in HEP today, to provide printout next visit Goal status: MET  LONG TERM GOALS: Target date: 02/24/2024  Pt will increase FOTO to at least 50 in order to demonstrate improvement in function related to mobility and QoL. Baseline: 44 7/29:48 9/10:40 12/24/2022= 51 Goal status: MET  2.  Pt (< 60 yrs old) will complete 5xSTS test < 30 seconds indicating an increase in LE strength and improved balance. Baseline: 10/01/22 34 sec (multiple initial attempts with posterior LOB in chair) 12/20: 49 seconds 11/04/22:1:06, min A on multiple attempts to maintain balance and pt stays in flexed posture throughout test when coming to standing.   9/10: 27.5 sec, Pushes WC post consistently, close CGA  10/22: 47 seconds first attempt 12/5: 1 minute, 37 seconds, multiple attempts to get first stand, but no difficulty after that; 03/20/2023= 1 min 2 sec with 1UE on walker and 1 on armrest- some posterior lean- Close CGA; 06/17/2023= 38 sec with 1 UE on walker and 1 on armrest; 08/21/2023= 35 sec with 1UE on walker and 1 on armrest.  8/5: 36  sec with 1 UE on walker 1 on arm rest; 12/23/2023= 37.05 sec with some posterior LOB- CGA to min A to correct.  01/29/24: 54.77 sec with significant posterior LOB and needing modA to come upright into standing 02/26/24: 46 sec with some posterior LOB on 2 out of 5 stands- min A to recover and stand Goal status: ONGOING  3.  Pt will increase to > 0.5 m/s (30 sec) as to improve gait speed for better home ambulation and reduce fall risk. Baseline: 10/01/22 0.096 m/s with RW, close CGA, and WC follow previously 12/27: 0.11 m/s 11/04/22: .056 m/s  9/10: 076 m/s 10/22: 0.64 m/s  11/26: .076 m/s; 03/20/2023= 0.08 m/s; 04/22/2023= 0.089 m/s; 05/15/2023 = 0.079 m/s; 05/27/2023= 0.11 m/s; 07/17/2023= 0.15 m/s; 08/21/2023= 0.14 m/s avg with Bariatric walker; 09/09/2023= 0.14 m/s; 10/02/2023= 0.09 m/s with Bariatric walker and Increased cues to swing RLE through vs. Previous sesisons. 11/13/2023= 0.12 m/s with Bariatric walker; 12/11/2023= 0.14 m/s; 12/23/2023=0.135 m/s 01/29/24: 3 min 25 sec, 0.05 m/s 02/26/2024: 0.14 m/s avg with BRW  Goal status: NOT PROGRESSING  4.  Pt will increase hip flexor and abductor  gross strength to 4+/5 bilat as to improve functional strength for independent gait, increased standing tolerance, and increased ADL ability. Baseline: 10/01/22 4+/5, exception hip flexors and abductors 4/5 each bilat  (previously 03/27/22: 4+/5 ) 9/10: Hip flexor 4/5, hip abductor 4+/5 10/22: hip flexor 4/5 hip abductor 4+/5 11/26: hip flexor 4+/5 hip abductor 4+/5 Goal status: MET  5.  Pt will ambulate 150 ft with BRW and WC follow with no rest breaks in less than 12 min to allow for improved mobility within home and increased independence. Baseline: 10/03/22 150' with BRW 04/03/22: 105 ft with BRW 7/29: 100 in 9:30 with pediatric RW 9/10:101 ft with BRW stooped posture worsened throughout test 12/24/2022- 150 feet using BRW, CGA with w/c follow- in 10 min 17 sec. Will keep goal to make  sure he can do this  consistently 10/22: 119 ft with BRW 8 min 23 seconds terminated by patient  11/26: 148 ft with BRW 14 min 34 seconds terminated by patient 03/20/2023= 152 feet using BRW, CGA and w/c follow in 14 min 3 sec- Need to keep goal active to ensure consistency but will revised and add a time element to goal. 04/22/2023= 162 feet in 11 min 54 sec using bariatric RW, CGA and close CGA assist; 05/27/2023- 170 feet without rest break using bariatric RW. 06/17/2023= 160 feet with bariatric 4WW in 9 min- stopped due to fatigue.  Goal status: MET   6. Pt will decrease TUG to below 1 min 30 seconds/decrease in order to demonstrate decreased fall risk. Baseline: 06/17/2023= 2 min using bariatric 4WW; 09/09/2023= 1 min 50 sec with bariatric front wheeled walker.  12/02/2023= 1 min 44 sec;  12/23/2023= 2 min 8 sec avg with BRW 01/29/24: 3 min 42 sec with BRW 02/26/24: 1 min 58 sec with BRW and max VC for turning. Goal status: PROGRESSING   7. Pt will ambulate 300 ft with BRW, CGA and WC follow with no rest breaks in less than 20 min to allow for improved mobility within home/community and increased independence. Baseline: 06/17/2023= 160 feet with bariatric 4WW in 9 min- stopped due to fatigue. 07/17/2023- Patient able to walk 270 feet in > 20 min with BRW, CGA and close w/c follow today- VC for posture and for gait sequencing; 08/21/2023- Not specifically testing but patient has been walking 160 feet or more each Visit; 09/09/2023= Patient ambulated 180 feet overall at one time today using bariatric RW, CGA with VC for erect posture and for increased step length.  10/02/2023= 165 feet with Bariatric RW- close w/c follow and CGA. More diffiuclty with gait sequencing more consistent VC for erect posture.  12/02/2023- Paitent ambulated >100 feet in // bars today but will reassess using walker next visit.  12/11/2023= Patient ambulated 204 feet with BRW and Close CGA and w/c follow 01/29/24: Patient ambulated 111 feet with BRW and  Close CGA and w/c follow (10 min 59 sec) before needing to rest 02/24/2024= Patient ambulated 180 feet with BRW and Close CGA.  Goal Status: PROGRESSING  8. Patient will report ability to walk out his front door including stepping over threshold using his walker with min A of caregiver to enable him to be more independent with household mobility and be able to go out to porch with less dependence on caregivers. Baseline: Patient dependent on caregivers to push him in his wheelchair to go out to porch and reports dependent on them to always assist so he doesn't go outside like he would like to. 01/29/24: Pt reports he has not been able to attempt this due to not having a male at home to assist him. 02/24/2024: Patient reports has been able to step outside on his porch a couple of time with use of walker and close caregiver support.  Goal status: MET  9. Patient will stand at kitchen counter or support bar for > 5 min without UE support to demonstrate improved standing posture/endurance/LE strength with standing ADLs to assist with toileting, changing clothes, or light kitchen activities.  Baseline: Patient able to stand approx 2.5 min without posterior lean and no UE support.  Goal status: NEW  ASSESSMENT :  CLINICAL IMPRESSION:   Patient presents good motivation for today's session. He presents with some difficulty maintaining erect standing posture with more posterior lean and more  overall leg fatigue. He did perform mostly standing activities and able to nearly fully erect with less overall cues- however increased difficulty today with hands free standing.  Pt will continue to benefit from skilled therapy to address remaining deficits in order to improve overall QoL and return to PLOF.       OBJECTIVE IMPAIRMENTS Abnormal gait, decreased activity tolerance, decreased balance, decreased coordination, decreased endurance, decreased mobility, difficulty walking, decreased ROM, decreased  strength, improper body mechanics, postural dysfunction, and obesity.   ACTIVITY LIMITATIONS carrying, lifting, bending, sitting, standing, squatting, stairs, transfers, bed mobility, continence, bathing, toileting, dressing, hygiene/grooming, and locomotion level  PARTICIPATION LIMITATIONS: meal prep, cleaning, laundry, interpersonal relationship, driving, shopping, community activity, and yard work  PERSONAL FACTORS Fitness, Past/current experiences, Time since onset of injury/illness/exacerbation, and 3+ comorbidities: hx of brain tumor, past CVA, HTN, obesity, learning disability, DMII are also affecting patient's functional outcome.   REHAB POTENTIAL: Fair    CLINICAL DECISION MAKING: Stable/uncomplicated  EVALUATION COMPLEXITY: Low  PLAN: PT FREQUENCY: 1-2x/week  PT DURATION: 12 weeks  PLANNED INTERVENTIONS: Therapeutic exercises, Therapeutic activity, Neuromuscular re-education, Balance training, Gait training, Patient/Family education, Joint mobilization, Stair training, Vestibular training, Orthotic/Fit training, DME instructions, Dry Needling, Electrical stimulation, Wheelchair mobility training, Cryotherapy, Moist heat, Compression bandaging, Manual therapy, and Re-evaluation  PLAN FOR NEXT SESSION:  -Gait training with BWS system as appropriate.  -Bridging and review of some bed LE strengthening -Continue with functional strengthening exercises of the LE's  -Gait activities using RW. Incorporate proper technique with all transfers to decrease his risk of falling.   Reyes LOISE London PT  Physical Therapist- [place]  [facility]   5:16 PM 03/09/24

## 2024-03-09 ENCOUNTER — Ambulatory Visit

## 2024-03-09 DIAGNOSIS — M6281 Muscle weakness (generalized): Secondary | ICD-10-CM | POA: Diagnosis present

## 2024-03-09 DIAGNOSIS — R278 Other lack of coordination: Secondary | ICD-10-CM | POA: Insufficient documentation

## 2024-03-09 DIAGNOSIS — R2689 Other abnormalities of gait and mobility: Secondary | ICD-10-CM | POA: Diagnosis present

## 2024-03-09 DIAGNOSIS — R296 Repeated falls: Secondary | ICD-10-CM | POA: Diagnosis present

## 2024-03-09 DIAGNOSIS — I69351 Hemiplegia and hemiparesis following cerebral infarction affecting right dominant side: Secondary | ICD-10-CM | POA: Diagnosis present

## 2024-03-09 DIAGNOSIS — R269 Unspecified abnormalities of gait and mobility: Secondary | ICD-10-CM | POA: Diagnosis present

## 2024-03-09 DIAGNOSIS — R262 Difficulty in walking, not elsewhere classified: Secondary | ICD-10-CM | POA: Diagnosis present

## 2024-03-09 DIAGNOSIS — R2681 Unsteadiness on feet: Secondary | ICD-10-CM | POA: Diagnosis present

## 2024-03-09 DIAGNOSIS — I639 Cerebral infarction, unspecified: Secondary | ICD-10-CM | POA: Diagnosis present

## 2024-03-09 DIAGNOSIS — R531 Weakness: Secondary | ICD-10-CM | POA: Diagnosis present

## 2024-03-11 ENCOUNTER — Ambulatory Visit

## 2024-03-11 DIAGNOSIS — I69351 Hemiplegia and hemiparesis following cerebral infarction affecting right dominant side: Secondary | ICD-10-CM

## 2024-03-11 DIAGNOSIS — R2681 Unsteadiness on feet: Secondary | ICD-10-CM | POA: Diagnosis not present

## 2024-03-11 DIAGNOSIS — R262 Difficulty in walking, not elsewhere classified: Secondary | ICD-10-CM

## 2024-03-11 DIAGNOSIS — R531 Weakness: Secondary | ICD-10-CM

## 2024-03-11 DIAGNOSIS — R2689 Other abnormalities of gait and mobility: Secondary | ICD-10-CM

## 2024-03-11 DIAGNOSIS — M6281 Muscle weakness (generalized): Secondary | ICD-10-CM

## 2024-03-11 DIAGNOSIS — R278 Other lack of coordination: Secondary | ICD-10-CM

## 2024-03-11 DIAGNOSIS — R296 Repeated falls: Secondary | ICD-10-CM

## 2024-03-11 DIAGNOSIS — R269 Unspecified abnormalities of gait and mobility: Secondary | ICD-10-CM

## 2024-03-11 NOTE — Therapy (Signed)
 OUTPATIENT PHYSICAL THERAPY TREATMENT  Patient Name: JAMINE HIGHFILL MRN: [id number] DOB:[birth year], 58 y.o., male Today's Date: 03/12/2024  PCP: Sherial Bail, MD  REFERRING PROVIDER: Sherial Bail, MD    PT End of Session - 03/11/24 1333     Visit Number 128    Number of Visits 150    Date for Recertification  05/20/24    Progress Note Due on Visit 130    PT Start Time 1319    PT Stop Time 1359    PT Time Calculation (min) 40 min    Equipment Utilized During Treatment Gait belt    Activity Tolerance Patient tolerated treatment well    Behavior During Therapy WFL for tasks assessed/performed            Past Medical History:  Diagnosis Date   Asthma    GERD (gastroesophageal reflux disease)    Hyperlipidemia    Hypertension    Obesity    Stroke [facility])    Past Surgical History:  Procedure Laterality Date   BRAIN SURGERY     Patient Active Problem List   Diagnosis Date Noted   Impaired glucose tolerance 03/23/2020   Learning disability 03/23/2020   Urinary and fecal incontinence 06/14/2019   Chronic pain syndrome 03/23/2019   Constipation 03/23/2019   Dysarthria 03/23/2019   Dysphagia 03/23/2019   Learning difficulty 03/23/2019   Morbid obesity (HCC) 03/23/2019   Muscle weakness 03/23/2019   Vitamin D deficiency 03/23/2019   Bilateral carotid artery stenosis 12/15/2018   Moderate aortic valve stenosis 12/15/2018   BMI 45.0-49.9, adult (HCC) 11/10/2018   Cerebrovascular accident (HCC) 05/02/2016   Hemiparesis affecting right side as late effect of cerebrovascular accident (HCC) 05/01/2016   Brain tumor (HCC) 11/24/2014   Gastro-esophageal reflux disease without esophagitis 11/24/2014   Accident due to mechanical fall without injury 08/03/2014   Essential hypertension 06/13/2014   Mixed hyperlipidemia 06/13/2014   Unspecified sequelae of cerebral infarction 06/13/2014   Thyromegaly 01/07/2014   Type 2 diabetes mellitus without complication  01/06/2014   Neck mass 09/08/2013   Swimmer's ear 09/08/2013   Erectile dysfunction 01/03/2013   Prediabetes 01/03/2013   ONSET DATE: 05/09/2016  REFERRING DIAG: I69.351 (ICD-10-CM) - Hemiplegia and hemiparesis following cerebral infarction affecting right dominant side   THERAPY DIAG:  Unsteadiness on feet  Difficulty in walking, not elsewhere classified  Muscle weakness (generalized)  Abnormality of gait and mobility  Repeated falls  Other abnormalities of gait and mobility  Hemiplegia and hemiparesis following cerebral infarction affecting right dominant side (HCC)  Other lack of coordination  Left-sided weakness   Rationale for Evaluation and Treatment Rehabilitation  SUBJECTIVE:  SUBJECTIVE STATEMENT   Patient reports No changes since Tues visit  Pt accompanied by: family member   PERTINENT HISTORY:  The pt is a pleasant 58 yo male who returns to PT for evaluation of deficits s/o old CVA. Pt with hx of CVA Feb 2018. He was previously fully independent, but CVA caused significant deficits, mainly affecting his right side. Pt reports continued difficulty with walking and transfers. Pt still using WC for majority of mobility, otherwise he uses his RW. Pt lives with his mom who is his main caregiver. Patient had outpatient PT previously in clinic in 2023. He reports since discharge he has been completing his HEP and walking about every day. Pt would like to improve his walking mainly, but also would like to work on balance. He also reports difficulty with transfers. Pt's mom and his caregiver helps him with ADLs. However, he says he is now waiting for a new caregiver. Prior to this pt had caregiver 3x/week. PMH significant for hx of brain tumor, past CVA, DMII and HTN. See chart for  additional information.  PAIN:  Are you having pain? Pt reports no pain currently  PRECAUTIONS: fall  WEIGHT BEARING RESTRICTIONS none  PLOF: required assistance with ADLs (old CVA), prior to CVA in 2018 was indep.  History of Falls: Pt reports no recent falls  Home information: Pt lives with mother who is his primary caregiver. Previously had home aide, 3x/week but reports currently waiting for new one.  PATIENT GOALS: Pt would like to improve his ability to ambulate.  OBJECTIVE:  taken at [facility] unless otherwise noted:   DIAGNOSTIC FINDINGS: No recent imaging per chart  Objective measures taken at eval unless otherwise specified:   Posture: forward head posture, rounded shoulders  MMT LE: grossly  4+/5, exception hip flexors and abductors 4/5 each bilat   Functional tests:  5xSTS:  34 sec, use of BUE (multiple initial attempts with posterior LOB in chair)   : 0.096 m/s with RW, close CGA, and WC follow  Gait for distance (goal 150 ft): deferred due to time  Gait mechanics: impaired, decreased speed (see ), crouched posture, poor postural stability and relies heavily on BUE support on RW. Pt now with bilat AFOS   FOTO: 44 (goal 50)   TODAY'S TREATMENT : 03/11/24   TA: Sit to stand with 1 UE on support bar and 1 on armrest- 2 x 10 reps  Step tap onto 2in block 2 x 10 (VC for weight shifting)  Knock over cones (6) on 2 block x 5 rounds- Focusing on proprioception and accuracy of foot placement  NMR:  Static stand without UE support x 2 min without UE support - posterior LOB with constant cues to shift hips/upper trunk forward.   Gait training in // bars  4 rds of walking in // bars- down and back - focusing on increasing step length and erect posture. Patient exhibited very stiff LE with minimal Hip march/knee flex and fair foot clearance (with B AFO donned)  Patient rested 2 min then completed another 4 rds      PATIENT EDUCATION: Education details:  If pt can lean pelvis into support surface, his energy efficicency will be higher.  Person educated: Patient Education method: Explanation, Demonstration, Tactile cues, and Verbal cues Education comprehension: verbalized understanding, returned demonstration, and verbal cues required  HOME EXERCISE PROGRAM:  Standing hip flexor stretch at counter, prolonged standing with erect posture for gluteal activation, HS curls with RTB  Access Code:  [email or web address] URL: [email or web address] Date: 05/15/2023 Prepared by: Connell Kiss  Exercises - Seated Long Arc Quad with Ankle Weight  - 1 x daily - 7 x weekly - 2 sets - 10 reps  GOALS: Goals reviewed with patient? No  SHORT TERM GOALS: Target date: 11/12/2022  Pt will be independent with HEP in order to improve strength and balance in order to decrease fall risk and improve function at home. Baseline: instructed in HEP today, to provide printout next visit Goal status: MET  LONG TERM GOALS: Target date: 02/24/2024  Pt will increase FOTO to at least 50 in order to demonstrate improvement in function related to mobility and QoL. Baseline: 44 7/29:48 9/10:40 12/24/2022= 51 Goal status: MET  2.  Pt (< 60 yrs old) will complete 5xSTS test < 30 seconds indicating an increase in LE strength and improved balance. Baseline: 10/01/22 34 sec (multiple initial attempts with posterior LOB in chair) 12/20: 49 seconds 11/04/22:1:06, min A on multiple attempts to maintain balance and pt stays in flexed posture throughout test when coming to standing.   9/10: 27.5 sec, Pushes WC post consistently, close CGA  10/22: 47 seconds first attempt 12/5: 1 minute, 37 seconds, multiple attempts to get first stand, but no difficulty after that; 03/20/2023= 1 min 2 sec with 1UE on walker and 1 on armrest- some posterior lean- Close CGA; 06/17/2023= 38 sec with 1 UE on walker and 1 on armrest; 08/21/2023= 35 sec with 1UE on walker and 1 on armrest.  8/5: 36 sec with  1 UE on walker 1 on arm rest; 12/23/2023= 37.05 sec with some posterior LOB- CGA to min A to correct.  01/29/24: 54.77 sec with significant posterior LOB and needing modA to come upright into standing 02/26/24: 46 sec with some posterior LOB on 2 out of 5 stands- min A to recover and stand Goal status: ONGOING  3.  Pt will increase to > 0.5 m/s (30 sec) as to improve gait speed for better home ambulation and reduce fall risk. Baseline: 10/01/22 0.096 m/s with RW, close CGA, and WC follow previously 12/27: 0.11 m/s 11/04/22: .056 m/s  9/10: 076 m/s 10/22: 0.64 m/s  11/26: .076 m/s; 03/20/2023= 0.08 m/s; 04/22/2023= 0.089 m/s; 05/15/2023 = 0.079 m/s; 05/27/2023= 0.11 m/s; 07/17/2023= 0.15 m/s; 08/21/2023= 0.14 m/s avg with Bariatric walker; 09/09/2023= 0.14 m/s; 10/02/2023= 0.09 m/s with Bariatric walker and Increased cues to swing RLE through vs. Previous sesisons. 11/13/2023= 0.12 m/s with Bariatric walker; 12/11/2023= 0.14 m/s; 12/23/2023=0.135 m/s 01/29/24: 3 min 25 sec, 0.05 m/s 02/26/2024: 0.14 m/s avg with BRW  Goal status: NOT PROGRESSING  4.  Pt will increase hip flexor and abductor  gross strength to 4+/5 bilat as to improve functional strength for independent gait, increased standing tolerance, and increased ADL ability. Baseline: 10/01/22 4+/5, exception hip flexors and abductors 4/5 each bilat  (previously 03/27/22: 4+/5 ) 9/10: Hip flexor 4/5, hip abductor 4+/5 10/22: hip flexor 4/5 hip abductor 4+/5 11/26: hip flexor 4+/5 hip abductor 4+/5 Goal status: MET  5.  Pt will ambulate 150 ft with BRW and WC follow with no rest breaks in less than 12 min to allow for improved mobility within home and increased independence. Baseline: 10/03/22 150' with BRW 04/03/22: 105 ft with BRW 7/29: 100 in 9:30 with pediatric RW 9/10:101 ft with BRW stooped posture worsened throughout test 12/24/2022- 150 feet using BRW, CGA with w/c follow- in 10 min 17 sec. Will keep goal to make sure he can do  this  consistently 10/22: 119 ft with BRW 8 min 23 seconds terminated by patient  11/26: 148 ft with BRW 14 min 34 seconds terminated by patient 03/20/2023= 152 feet using BRW, CGA and w/c follow in 14 min 3 sec- Need to keep goal active to ensure consistency but will revised and add a time element to goal. 04/22/2023= 162 feet in 11 min 54 sec using bariatric RW, CGA and close CGA assist; 05/27/2023- 170 feet without rest break using bariatric RW. 06/17/2023= 160 feet with bariatric 4WW in 9 min- stopped due to fatigue.  Goal status: MET   6. Pt will decrease TUG to below 1 min 30 seconds/decrease in order to demonstrate decreased fall risk. Baseline: 06/17/2023= 2 min using bariatric 4WW; 09/09/2023= 1 min 50 sec with bariatric front wheeled walker.  12/02/2023= 1 min 44 sec;  12/23/2023= 2 min 8 sec avg with BRW 01/29/24: 3 min 42 sec with BRW 02/26/24: 1 min 58 sec with BRW and max VC for turning. Goal status: PROGRESSING   7. Pt will ambulate 300 ft with BRW, CGA and WC follow with no rest breaks in less than 20 min to allow for improved mobility within home/community and increased independence. Baseline: 06/17/2023= 160 feet with bariatric 4WW in 9 min- stopped due to fatigue. 07/17/2023- Patient able to walk 270 feet in > 20 min with BRW, CGA and close w/c follow today- VC for posture and for gait sequencing; 08/21/2023- Not specifically testing but patient has been walking 160 feet or more each Visit; 09/09/2023= Patient ambulated 180 feet overall at one time today using bariatric RW, CGA with VC for erect posture and for increased step length.  10/02/2023= 165 feet with Bariatric RW- close w/c follow and CGA. More diffiuclty with gait sequencing more consistent VC for erect posture.  12/02/2023- Paitent ambulated >100 feet in // bars today but will reassess using walker next visit.  12/11/2023= Patient ambulated 204 feet with BRW and Close CGA and w/c follow 01/29/24: Patient ambulated 111 feet with BRW and  Close CGA and w/c follow (10 min 59 sec) before needing to rest 02/24/2024= Patient ambulated 180 feet with BRW and Close CGA.  Goal Status: PROGRESSING  8. Patient will report ability to walk out his front door including stepping over threshold using his walker with min A of caregiver to enable him to be more independent with household mobility and be able to go out to porch with less dependence on caregivers. Baseline: Patient dependent on caregivers to push him in his wheelchair to go out to porch and reports dependent on them to always assist so he doesn't go outside like he would like to. 01/29/24: Pt reports he has not been able to attempt this due to not having a male at home to assist him. 02/24/2024: Patient reports has been able to step outside on his porch a couple of time with use of walker and close caregiver support.  Goal status: MET  9. Patient will stand at kitchen counter or support bar for > 5 min without UE support to demonstrate improved standing posture/endurance/LE strength with standing ADLs to assist with toileting, changing clothes, or light kitchen activities.  Baseline: Patient able to stand approx 2.5 min without posterior lean and no UE support.  Goal status: NEW  ASSESSMENT :  CLINICAL IMPRESSION:   Patient presents with improved RLE swing through with walking in // bars. He later exhibited improved B LE hip flex as seen by ability to step  tap with some minimal flex of knee and march rather than stiff Leg advancement. He was fatigued and challenged with knocking over cones 1by1 with each foot but did improve and quoted activity as fun Pt will continue to benefit from skilled therapy to address remaining deficits in order to improve overall QoL and return to PLOF.       OBJECTIVE IMPAIRMENTS Abnormal gait, decreased activity tolerance, decreased balance, decreased coordination, decreased endurance, decreased mobility, difficulty walking, decreased ROM, decreased  strength, improper body mechanics, postural dysfunction, and obesity.   ACTIVITY LIMITATIONS carrying, lifting, bending, sitting, standing, squatting, stairs, transfers, bed mobility, continence, bathing, toileting, dressing, hygiene/grooming, and locomotion level  PARTICIPATION LIMITATIONS: meal prep, cleaning, laundry, interpersonal relationship, driving, shopping, community activity, and yard work  PERSONAL FACTORS Fitness, Past/current experiences, Time since onset of injury/illness/exacerbation, and 3+ comorbidities: hx of brain tumor, past CVA, HTN, obesity, learning disability, DMII are also affecting patient's functional outcome.   REHAB POTENTIAL: Fair    CLINICAL DECISION MAKING: Stable/uncomplicated  EVALUATION COMPLEXITY: Low  PLAN: PT FREQUENCY: 1-2x/week  PT DURATION: 12 weeks  PLANNED INTERVENTIONS: Therapeutic exercises, Therapeutic activity, Neuromuscular re-education, Balance training, Gait training, Patient/Family education, Joint mobilization, Stair training, Vestibular training, Orthotic/Fit training, DME instructions, Dry Needling, Electrical stimulation, Wheelchair mobility training, Cryotherapy, Moist heat, Compression bandaging, Manual therapy, and Re-evaluation  PLAN FOR NEXT SESSION:  -Gait training with BWS system as appropriate.  -Bridging and review of some bed LE strengthening -Continue with functional strengthening exercises of the LE's  -Gait activities using RW. Incorporate proper technique with all transfers to decrease his risk of falling.   Reyes LOISE London PT  Physical Therapist- [facility]   11:09 AM 03/12/24

## 2024-03-16 ENCOUNTER — Ambulatory Visit

## 2024-03-16 DIAGNOSIS — I639 Cerebral infarction, unspecified: Secondary | ICD-10-CM

## 2024-03-16 DIAGNOSIS — R531 Weakness: Secondary | ICD-10-CM

## 2024-03-16 DIAGNOSIS — R269 Unspecified abnormalities of gait and mobility: Secondary | ICD-10-CM

## 2024-03-16 DIAGNOSIS — M6281 Muscle weakness (generalized): Secondary | ICD-10-CM

## 2024-03-16 DIAGNOSIS — R278 Other lack of coordination: Secondary | ICD-10-CM

## 2024-03-16 DIAGNOSIS — R262 Difficulty in walking, not elsewhere classified: Secondary | ICD-10-CM

## 2024-03-16 DIAGNOSIS — R2681 Unsteadiness on feet: Secondary | ICD-10-CM | POA: Diagnosis not present

## 2024-03-16 DIAGNOSIS — R2689 Other abnormalities of gait and mobility: Secondary | ICD-10-CM

## 2024-03-16 DIAGNOSIS — I69351 Hemiplegia and hemiparesis following cerebral infarction affecting right dominant side: Secondary | ICD-10-CM

## 2024-03-16 DIAGNOSIS — R296 Repeated falls: Secondary | ICD-10-CM

## 2024-03-16 NOTE — Therapy (Signed)
 OUTPATIENT PHYSICAL THERAPY TREATMENT  Patient Name: Curtis Powell MRN: [id number] DOB:1965-10-05, 58 y.o., male Today's Date: 03/16/2024  PCP: Sherial Bail, MD  REFERRING PROVIDER: Sherial Bail, MD    PT End of Session - 03/16/24 1312     Visit Number 129    Number of Visits 150    Date for Recertification  05/20/24    Progress Note Due on Visit 130    PT Start Time 1312    Equipment Utilized During Treatment Gait belt    Activity Tolerance Patient tolerated treatment well    Behavior During Therapy [facility] for tasks assessed/performed            Past Medical History:  Diagnosis Date   Asthma    GERD (gastroesophageal reflux disease)    Hyperlipidemia    Hypertension    Obesity    Stroke [facility])    Past Surgical History:  Procedure Laterality Date   BRAIN SURGERY     Patient Active Problem List   Diagnosis Date Noted   Impaired glucose tolerance 03/23/2020   Learning disability 03/23/2020   Urinary and fecal incontinence 06/14/2019   Chronic pain syndrome 03/23/2019   Constipation 03/23/2019   Dysarthria 03/23/2019   Dysphagia 03/23/2019   Learning difficulty 03/23/2019   Morbid obesity (HCC) 03/23/2019   Muscle weakness 03/23/2019   Vitamin D deficiency 03/23/2019   Bilateral carotid artery stenosis 12/15/2018   Moderate aortic valve stenosis 12/15/2018   BMI 45.0-49.9, adult (HCC) 11/10/2018   Cerebrovascular accident (HCC) 05/02/2016   Hemiparesis affecting right side as late effect of cerebrovascular accident (HCC) 05/01/2016   Brain tumor (HCC) 11/24/2014   Gastro-esophageal reflux disease without esophagitis 11/24/2014   Accident due to mechanical fall without injury 08/03/2014   Essential hypertension 06/13/2014   Mixed hyperlipidemia 06/13/2014   Unspecified sequelae of cerebral infarction 06/13/2014   Thyromegaly 01/07/2014   Type 2 diabetes mellitus without complication 01/06/2014   Neck mass 09/08/2013   Swimmer's ear 09/08/2013    Erectile dysfunction 01/03/2013   Prediabetes 01/03/2013   ONSET DATE: 05/09/2016  REFERRING DIAG: I69.351 (ICD-10-CM) - Hemiplegia and hemiparesis following cerebral infarction affecting right dominant side   THERAPY DIAG:  Unsteadiness on feet  Difficulty in walking, not elsewhere classified  Muscle weakness (generalized)  Abnormality of gait and mobility  Repeated falls  Other abnormalities of gait and mobility  Hemiplegia and hemiparesis following cerebral infarction affecting right dominant side (HCC)  Other lack of coordination  Left-sided weakness  Acute CVA (cerebrovascular accident) (HCC)   Rationale for Evaluation and Treatment Rehabilitation  SUBJECTIVE:  SUBJECTIVE STATEMENT   Patient reports No changes since Tues visit  Pt accompanied by: family member   PERTINENT HISTORY:  The pt is a pleasant 58 yo male who returns to PT for evaluation of deficits s/o old CVA. Pt with hx of CVA Feb 2018. He was previously fully independent, but CVA caused significant deficits, mainly affecting his right side. Pt reports continued difficulty with walking and transfers. Pt still using WC for majority of mobility, otherwise he uses his RW. Pt lives with his mom who is his main caregiver. Patient had outpatient PT previously in clinic in 2023. He reports since discharge he has been completing his HEP and walking about every day. Pt would like to improve his walking mainly, but also would like to work on balance. He also reports difficulty with transfers. Pt's mom and his caregiver helps him with ADLs. However, he says he is now waiting for a new caregiver. Prior to this pt had caregiver 3x/week. PMH significant for hx of brain tumor, past CVA, DMII and HTN. See chart for additional  information.  PAIN:  Are you having pain? Pt reports no pain currently  PRECAUTIONS: fall  WEIGHT BEARING RESTRICTIONS none  PLOF: required assistance with ADLs (old CVA), prior to CVA in 2018 was indep.  History of Falls: Pt reports no recent falls  Home information: Pt lives with mother who is his primary caregiver. Previously had home aide, 3x/week but reports currently waiting for new one.  PATIENT GOALS: Pt would like to improve his ability to ambulate.  OBJECTIVE:  taken at [facility] unless otherwise noted:   DIAGNOSTIC FINDINGS: No recent imaging per chart  Objective measures taken at eval unless otherwise specified:   Posture: forward head posture, rounded shoulders  MMT LE: grossly  4+/5, exception hip flexors and abductors 4/5 each bilat   Functional tests:  5xSTS:  34 sec, use of BUE (multiple initial attempts with posterior LOB in chair)   : 0.096 m/s with RW, close CGA, and WC follow  Gait for distance (goal 150 ft): deferred due to time  Gait mechanics: impaired, decreased speed (see ), crouched posture, poor postural stability and relies heavily on BUE support on RW. Pt now with bilat AFOS   FOTO: 44 (goal 50)   TODAY'S TREATMENT : 03/16/24   TA: Sit to stand with 1 UE on support bar and 1 on armrest- 2 x 10 reps    NMR:  Static stand without UE support x 2 min without UE support - posterior LOB with constant cues to shift hips/upper trunk forward.   Gait training:  Walking with Bariatric 4WW - 180 feet with close CGA and w/c follow-   4 rds of walking in // bars- down and back - focusing on increasing step length and erect posture. Patient exhibited very stiff LE with minimal Hip march/knee flex and fair foot clearance (with B AFO donned)  Patient rested 2 min then completed another 4 rds      PATIENT EDUCATION: Education details: If pt can lean pelvis into support surface, his energy efficicency will be higher.  Person educated:  Patient Education method: Explanation, Demonstration, Tactile cues, and Verbal cues Education comprehension: verbalized understanding, returned demonstration, and verbal cues required  HOME EXERCISE PROGRAM:  Standing hip flexor stretch at counter, prolonged standing with erect posture for gluteal activation, HS curls with RTB  Access Code: [email or web address] URL: [email or web address] Date: 05/15/2023 Prepared by: Connell Kiss  Exercises - Seated Long Arc Quad with Ankle  Weight  - 1 x daily - 7 x weekly - 2 sets - 10 reps  GOALS: Goals reviewed with patient? No  SHORT TERM GOALS: Target date: 11/12/2022  Pt will be independent with HEP in order to improve strength and balance in order to decrease fall risk and improve function at home. Baseline: instructed in HEP today, to provide printout next visit Goal status: MET  LONG TERM GOALS: Target date: 02/24/2024  Pt will increase FOTO to at least 50 in order to demonstrate improvement in function related to mobility and QoL. Baseline: 44 7/29:48 9/10:40 12/24/2022= 51 Goal status: MET  2.  Pt (< 60 yrs old) will complete 5xSTS test < 30 seconds indicating an increase in LE strength and improved balance. Baseline: 10/01/22 34 sec (multiple initial attempts with posterior LOB in chair) 12/20: 49 seconds 11/04/22:1:06, min A on multiple attempts to maintain balance and pt stays in flexed posture throughout test when coming to standing.   9/10: 27.5 sec, Pushes WC post consistently, close CGA  10/22: 47 seconds first attempt 12/5: 1 minute, 37 seconds, multiple attempts to get first stand, but no difficulty after that; 03/20/2023= 1 min 2 sec with 1UE on walker and 1 on armrest- some posterior lean- Close CGA; 06/17/2023= 38 sec with 1 UE on walker and 1 on armrest; 08/21/2023= 35 sec with 1UE on walker and 1 on armrest.  8/5: 36 sec with 1 UE on walker 1 on arm rest; 12/23/2023= 37.05 sec with some posterior LOB- CGA to min A to correct.   01/29/24: 54.77 sec with significant posterior LOB and needing modA to come upright into standing 02/26/24: 46 sec with some posterior LOB on 2 out of 5 stands- min A to recover and stand Goal status: ONGOING  3.  Pt will increase to > 0.5 m/s (30 sec) as to improve gait speed for better home ambulation and reduce fall risk. Baseline: 10/01/22 0.096 m/s with RW, close CGA, and WC follow previously 12/27: 0.11 m/s 11/04/22: .056 m/s  9/10: 076 m/s 10/22: 0.64 m/s  11/26: .076 m/s; 03/20/2023= 0.08 m/s; 04/22/2023= 0.089 m/s; 05/15/2023 = 0.079 m/s; 05/27/2023= 0.11 m/s; 07/17/2023= 0.15 m/s; 08/21/2023= 0.14 m/s avg with Bariatric walker; 09/09/2023= 0.14 m/s; 10/02/2023= 0.09 m/s with Bariatric walker and Increased cues to swing RLE through vs. Previous sesisons. 11/13/2023= 0.12 m/s with Bariatric walker; 12/11/2023= 0.14 m/s; 12/23/2023=0.135 m/s 01/29/24: 3 min 25 sec, 0.05 m/s 02/26/2024: 0.14 m/s avg with BRW  Goal status: NOT PROGRESSING  4.  Pt will increase hip flexor and abductor  gross strength to 4+/5 bilat as to improve functional strength for independent gait, increased standing tolerance, and increased ADL ability. Baseline: 10/01/22 4+/5, exception hip flexors and abductors 4/5 each bilat  (previously 03/27/22: 4+/5 ) 9/10: Hip flexor 4/5, hip abductor 4+/5 10/22: hip flexor 4/5 hip abductor 4+/5 11/26: hip flexor 4+/5 hip abductor 4+/5 Goal status: MET  5.  Pt will ambulate 150 ft with BRW and WC follow with no rest breaks in less than 12 min to allow for improved mobility within home and increased independence. Baseline: 10/03/22 150' with BRW 04/03/22: 105 ft with BRW 7/29: 100 in 9:30 with pediatric RW 9/10:101 ft with BRW stooped posture worsened throughout test 12/24/2022- 150 feet using BRW, CGA with w/c follow- in 10 min 17 sec. Will keep goal to make sure he can do this consistently 10/22: 119 ft with BRW 8 min 23 seconds terminated by patient  11/26: 148 ft  with BRW 14 min 34  seconds terminated by patient 03/20/2023= 152 feet using BRW, CGA and w/c follow in 14 min 3 sec- Need to keep goal active to ensure consistency but will revised and add a time element to goal. 04/22/2023= 162 feet in 11 min 54 sec using bariatric RW, CGA and close CGA assist; 05/27/2023- 170 feet without rest break using bariatric RW. 06/17/2023= 160 feet with bariatric 4WW in 9 min- stopped due to fatigue.  Goal status: MET   6. Pt will decrease TUG to below 1 min 30 seconds/decrease in order to demonstrate decreased fall risk. Baseline: 06/17/2023= 2 min using bariatric 4WW; 09/09/2023= 1 min 50 sec with bariatric front wheeled walker.  12/02/2023= 1 min 44 sec;  12/23/2023= 2 min 8 sec avg with BRW 01/29/24: 3 min 42 sec with BRW 02/26/24: 1 min 58 sec with BRW and max VC for turning. Goal status: PROGRESSING   7. Pt will ambulate 300 ft with BRW, CGA and WC follow with no rest breaks in less than 20 min to allow for improved mobility within home/community and increased independence. Baseline: 06/17/2023= 160 feet with bariatric 4WW in 9 min- stopped due to fatigue. 07/17/2023- Patient able to walk 270 feet in > 20 min with BRW, CGA and close w/c follow today- VC for posture and for gait sequencing; 08/21/2023- Not specifically testing but patient has been walking 160 feet or more each Visit; 09/09/2023= Patient ambulated 180 feet overall at one time today using bariatric RW, CGA with VC for erect posture and for increased step length.  10/02/2023= 165 feet with Bariatric RW- close w/c follow and CGA. More diffiuclty with gait sequencing more consistent VC for erect posture.  12/02/2023- Paitent ambulated >100 feet in // bars today but will reassess using walker next visit.  12/11/2023= Patient ambulated 204 feet with BRW and Close CGA and w/c follow 01/29/24: Patient ambulated 111 feet with BRW and Close CGA and w/c follow (10 min 59 sec) before needing to rest 02/24/2024= Patient ambulated 180 feet with BRW  and Close CGA.  Goal Status: PROGRESSING  8. Patient will report ability to walk out his front door including stepping over threshold using his walker with min A of caregiver to enable him to be more independent with household mobility and be able to go out to porch with less dependence on caregivers. Baseline: Patient dependent on caregivers to push him in his wheelchair to go out to porch and reports dependent on them to always assist so he doesn't go outside like he would like to. 01/29/24: Pt reports he has not been able to attempt this due to not having a male at home to assist him. 02/24/2024: Patient reports has been able to step outside on his porch a couple of time with use of walker and close caregiver support.  Goal status: MET  9. Patient will stand at kitchen counter or support bar for > 5 min without UE support to demonstrate improved standing posture/endurance/LE strength with standing ADLs to assist with toileting, changing clothes, or light kitchen activities.  Baseline: Patient able to stand approx 2.5 min without posterior lean and no UE support.  Goal status: NEW  ASSESSMENT :  CLINICAL IMPRESSION:   Patient presents with improved RLE swing through with walking in // bars. He later exhibited improved B LE hip flex as seen by ability to step tap with some minimal flex of knee and march rather than stiff Leg advancement. He was fatigued and  challenged with knocking over cones 1by1 with each foot but did improve and quoted activity as fun Pt will continue to benefit from skilled therapy to address remaining deficits in order to improve overall QoL and return to PLOF.       OBJECTIVE IMPAIRMENTS Abnormal gait, decreased activity tolerance, decreased balance, decreased coordination, decreased endurance, decreased mobility, difficulty walking, decreased ROM, decreased strength, improper body mechanics, postural dysfunction, and obesity.   ACTIVITY LIMITATIONS carrying, lifting,  bending, sitting, standing, squatting, stairs, transfers, bed mobility, continence, bathing, toileting, dressing, hygiene/grooming, and locomotion level  PARTICIPATION LIMITATIONS: meal prep, cleaning, laundry, interpersonal relationship, driving, shopping, community activity, and yard work  PERSONAL FACTORS Fitness, Past/current experiences, Time since onset of injury/illness/exacerbation, and 3+ comorbidities: hx of brain tumor, past CVA, HTN, obesity, learning disability, DMII are also affecting patient's functional outcome.   REHAB POTENTIAL: Fair    CLINICAL DECISION MAKING: Stable/uncomplicated  EVALUATION COMPLEXITY: Low  PLAN: PT FREQUENCY: 1-2x/week  PT DURATION: 12 weeks  PLANNED INTERVENTIONS: Therapeutic exercises, Therapeutic activity, Neuromuscular re-education, Balance training, Gait training, Patient/Family education, Joint mobilization, Stair training, Vestibular training, Orthotic/Fit training, DME instructions, Dry Needling, Electrical stimulation, Wheelchair mobility training, Cryotherapy, Moist heat, Compression bandaging, Manual therapy, and Re-evaluation  PLAN FOR NEXT SESSION:  -Gait training with BWS system as appropriate.  -Bridging and review of some bed LE strengthening -Continue with functional strengthening exercises of the LE's  -Gait activities using RW. Incorporate proper technique with all transfers to decrease his risk of falling.   Reyes LOISE London PT  Physical Therapist- [place]  [facility]   1:38 PM 03/16/24

## 2024-03-18 ENCOUNTER — Ambulatory Visit

## 2024-03-18 DIAGNOSIS — I69351 Hemiplegia and hemiparesis following cerebral infarction affecting right dominant side: Secondary | ICD-10-CM

## 2024-03-18 DIAGNOSIS — R269 Unspecified abnormalities of gait and mobility: Secondary | ICD-10-CM

## 2024-03-18 DIAGNOSIS — R262 Difficulty in walking, not elsewhere classified: Secondary | ICD-10-CM

## 2024-03-18 DIAGNOSIS — M6281 Muscle weakness (generalized): Secondary | ICD-10-CM

## 2024-03-18 DIAGNOSIS — R2681 Unsteadiness on feet: Secondary | ICD-10-CM | POA: Diagnosis not present

## 2024-03-18 DIAGNOSIS — R2689 Other abnormalities of gait and mobility: Secondary | ICD-10-CM

## 2024-03-18 DIAGNOSIS — R278 Other lack of coordination: Secondary | ICD-10-CM

## 2024-03-18 DIAGNOSIS — R296 Repeated falls: Secondary | ICD-10-CM

## 2024-03-18 NOTE — Therapy (Signed)
 OUTPATIENT PHYSICAL THERAPY TREATMENT/Physical Therapy Progress Note   Dates of reporting period  01/29/2024   to   03/18/2024   Patient Name: Curtis Powell MRN: [id number] DOB:[birth year], 58 y.o., male Today's Date: 03/18/2024  PCP: Sherial Bail, MD  REFERRING PROVIDER: Sherial Bail, MD    PT End of Session - 03/18/24 1331     Visit Number 130    Number of Visits 150    Date for Recertification  05/20/24    Progress Note Due on Visit 130    PT Start Time 1315    Equipment Utilized During Treatment Gait belt    Activity Tolerance Patient tolerated treatment well    Behavior During Therapy [facility] for tasks assessed/performed            Past Medical History:  Diagnosis Date   Asthma    GERD (gastroesophageal reflux disease)    Hyperlipidemia    Hypertension    Obesity    Stroke [facility])    Past Surgical History:  Procedure Laterality Date   BRAIN SURGERY     Patient Active Problem List   Diagnosis Date Noted   Impaired glucose tolerance 03/23/2020   Learning disability 03/23/2020   Urinary and fecal incontinence 06/14/2019   Chronic pain syndrome 03/23/2019   Constipation 03/23/2019   Dysarthria 03/23/2019   Dysphagia 03/23/2019   Learning difficulty 03/23/2019   Morbid obesity (HCC) 03/23/2019   Muscle weakness 03/23/2019   Vitamin D deficiency 03/23/2019   Bilateral carotid artery stenosis 12/15/2018   Moderate aortic valve stenosis 12/15/2018   BMI 45.0-49.9, adult (HCC) 11/10/2018   Cerebrovascular accident (HCC) 05/02/2016   Hemiparesis affecting right side as late effect of cerebrovascular accident (HCC) 05/01/2016   Brain tumor (HCC) 11/24/2014   Gastro-esophageal reflux disease without esophagitis 11/24/2014   Accident due to mechanical fall without injury 08/03/2014   Essential hypertension 06/13/2014   Mixed hyperlipidemia 06/13/2014   Unspecified sequelae of cerebral infarction 06/13/2014   Thyromegaly 01/07/2014   Type 2 diabetes  mellitus without complication 01/06/2014   Neck mass 09/08/2013   Swimmer's ear 09/08/2013   Erectile dysfunction 01/03/2013   Prediabetes 01/03/2013   ONSET DATE: 05/09/2016  REFERRING DIAG: I69.351 (ICD-10-CM) - Hemiplegia and hemiparesis following cerebral infarction affecting right dominant side   THERAPY DIAG:  Unsteadiness on feet  Difficulty in walking, not elsewhere classified  Muscle weakness (generalized)  Abnormality of gait and mobility  Repeated falls  Other abnormalities of gait and mobility  Hemiplegia and hemiparesis following cerebral infarction affecting right dominant side (HCC)  Other lack of coordination   Rationale for Evaluation and Treatment Rehabilitation  SUBJECTIVE:  SUBJECTIVE STATEMENT   Patient reports having issues with his wheelchair including left brake and the handles are very loose on the back  Pt accompanied by: family member   PERTINENT HISTORY:  The pt is a pleasant 58 yo male who returns to PT for evaluation of deficits s/o old CVA. Pt with hx of CVA Feb 2018. He was previously fully independent, but CVA caused significant deficits, mainly affecting his right side. Pt reports continued difficulty with walking and transfers. Pt still using WC for majority of mobility, otherwise he uses his RW. Pt lives with his mom who is his main caregiver. Patient had outpatient PT previously in clinic in 2023. He reports since discharge he has been completing his HEP and walking about every day. Pt would like to improve his walking mainly, but also would like to work on balance. He also reports difficulty with transfers. Pt's mom and his caregiver helps him with ADLs. However, he says he is now waiting for a new caregiver. Prior to this pt had caregiver 3x/week. PMH  significant for hx of brain tumor, past CVA, DMII and HTN. See chart for additional information.  PAIN:  Are you having pain? Pt reports no pain currently  PRECAUTIONS: fall  WEIGHT BEARING RESTRICTIONS none  PLOF: required assistance with ADLs (old CVA), prior to CVA in 2018 was indep.  History of Falls: Pt reports no recent falls  Home information: Pt lives with mother who is his primary caregiver. Previously had home aide, 3x/week but reports currently waiting for new one.  PATIENT GOALS: Pt would like to improve his ability to ambulate.  OBJECTIVE:  taken at [facility] unless otherwise noted:   DIAGNOSTIC FINDINGS: No recent imaging per chart  Objective measures taken at eval unless otherwise specified:   Posture: forward head posture, rounded shoulders  MMT LE: grossly  4+/5, exception hip flexors and abductors 4/5 each bilat   Functional tests:  5xSTS:  34 sec, use of BUE (multiple initial attempts with posterior LOB in chair)   : 0.096 m/s with RW, close CGA, and WC follow  Gait for distance (goal 150 ft): deferred due to time  Gait mechanics: impaired, decreased speed (see ), crouched posture, poor postural stability and relies heavily on BUE support on RW. Pt now with bilat AFOS   FOTO: 44 (goal 50)   TODAY'S TREATMENT : 03/18/24   TA: Sit to stand with 1 UE on support bar and 1 on armrest- 2 x 10 reps   Pt performed 5 time sit<>stand (5xSTS): 36 sec from green chair rather than seat of w/c with 1 arm on armrest and the other on walker-  sec (>15 sec indicates increased fall risk)    PT instructed pt in TUG: 1 min 45  sec  x 2 trials(average of 3 trials; >13.5 sec indicates increased fall risk)   Retro walking x 10 feet x 4 trials with rest between using BRW   PATIENT EDUCATION: Education details: If pt can lean pelvis into support surface, his energy efficicency will be higher.  Person educated: Patient Education method: Explanation, Demonstration,  Tactile cues, and Verbal cues Education comprehension: verbalized understanding, returned demonstration, and verbal cues required  HOME EXERCISE PROGRAM:  Standing hip flexor stretch at counter, prolonged standing with erect posture for gluteal activation, HS curls with RTB  Access Code: [email or web address] URL: [email or web address] Date: 05/15/2023 Prepared by: Connell Kiss  Exercises - Seated Long Arc Quad with Ankle Weight  - 1 x daily - 7  x weekly - 2 sets - 10 reps  GOALS: Goals reviewed with patient? No  SHORT TERM GOALS: Target date: 11/12/2022  Pt will be independent with HEP in order to improve strength and balance in order to decrease fall risk and improve function at home. Baseline: instructed in HEP today, to provide printout next visit Goal status: MET  LONG TERM GOALS: Target date: 02/24/2024  Pt will increase FOTO to at least 50 in order to demonstrate improvement in function related to mobility and QoL. Baseline: 44 7/29:48 9/10:40 12/24/2022= 51 Goal status: MET  2.  Pt (< 60 yrs old) will complete 5xSTS test < 30 seconds indicating an increase in LE strength and improved balance. Baseline: 10/01/22 34 sec (multiple initial attempts with posterior LOB in chair) 12/20: 49 seconds 11/04/22:1:06, min A on multiple attempts to maintain balance and pt stays in flexed posture throughout test when coming to standing.   9/10: 27.5 sec, Pushes WC post consistently, close CGA  10/22: 47 seconds first attempt 12/5: 1 minute, 37 seconds, multiple attempts to get first stand, but no difficulty after that; 03/20/2023= 1 min 2 sec with 1UE on walker and 1 on armrest- some posterior lean- Close CGA; 06/17/2023= 38 sec with 1 UE on walker and 1 on armrest; 08/21/2023= 35 sec with 1UE on walker and 1 on armrest.  8/5: 36 sec with 1 UE on walker 1 on arm rest; 12/23/2023= 37.05 sec with some posterior LOB- CGA to min A to correct.  01/29/24: 54.77 sec with significant posterior LOB  and needing modA to come upright into standing 02/26/24: 46 sec with some posterior LOB on 2 out of 5 stands- min A to recover and stand 03/18/2024= 36 sec from green chair rather than seat of w/c with 1 arm on armrest and the other on walker-  Goal status: ONGOING  3.  Pt will increase to > 0.5 m/s (30 sec) as to improve gait speed for better home ambulation and reduce fall risk. Baseline: 10/01/22 0.096 m/s with RW, close CGA, and WC follow previously 12/27: 0.11 m/s 11/04/22: .056 m/s  9/10: 076 m/s 10/22: 0.64 m/s  11/26: .076 m/s; 03/20/2023= 0.08 m/s; 04/22/2023= 0.089 m/s; 05/15/2023 = 0.079 m/s; 05/27/2023= 0.11 m/s; 07/17/2023= 0.15 m/s; 08/21/2023= 0.14 m/s avg with Bariatric walker; 09/09/2023= 0.14 m/s; 10/02/2023= 0.09 m/s with Bariatric walker and Increased cues to swing RLE through vs. Previous sesisons. 11/13/2023= 0.12 m/s with Bariatric walker; 12/11/2023= 0.14 m/s; 12/23/2023=0.135 m/s 01/29/24: 3 min 25 sec, 0.05 m/s 02/26/2024: 0.14 m/s avg with BRW  Goal status: NOT PROGRESSING  4.  Pt will increase hip flexor and abductor  gross strength to 4+/5 bilat as to improve functional strength for independent gait, increased standing tolerance, and increased ADL ability. Baseline: 10/01/22 4+/5, exception hip flexors and abductors 4/5 each bilat  (previously 03/27/22: 4+/5 ) 9/10: Hip flexor 4/5, hip abductor 4+/5 10/22: hip flexor 4/5 hip abductor 4+/5 11/26: hip flexor 4+/5 hip abductor 4+/5 Goal status: MET  5.  Pt will ambulate 150 ft with BRW and WC follow with no rest breaks in less than 12 min to allow for improved mobility within home and increased independence. Baseline: 10/03/22 150' with BRW 04/03/22: 105 ft with BRW 7/29: 100 in 9:30 with pediatric RW 9/10:101 ft with BRW stooped posture worsened throughout test 12/24/2022- 150 feet using BRW, CGA with w/c follow- in 10 min 17 sec. Will keep goal to make sure he can do this consistently 10/22: 119  ft with BRW 8 min 23 seconds  terminated by patient  11/26: 148 ft with BRW 14 min 34 seconds terminated by patient 03/20/2023= 152 feet using BRW, CGA and w/c follow in 14 min 3 sec- Need to keep goal active to ensure consistency but will revised and add a time element to goal. 04/22/2023= 162 feet in 11 min 54 sec using bariatric RW, CGA and close CGA assist; 05/27/2023- 170 feet without rest break using bariatric RW. 06/17/2023= 160 feet with bariatric 4WW in 9 min- stopped due to fatigue.  Goal status: MET   6. Pt will decrease TUG to below 1 min 30 seconds/decrease in order to demonstrate decreased fall risk. Baseline: 06/17/2023= 2 min using bariatric 4WW; 09/09/2023= 1 min 50 sec with bariatric front wheeled walker.  12/02/2023= 1 min 44 sec;  12/23/2023= 2 min 8 sec avg with BRW 01/29/24: 3 min 42 sec with BRW 02/26/24: 1 min 58 sec with BRW and max VC for turning. 03/18/24: 1 min 45 sec with BRW and mod VC for turning Goal status: PROGRESSING   7. Pt will ambulate 300 ft with BRW, CGA and WC follow with no rest breaks in less than 20 min to allow for improved mobility within home/community and increased independence. Baseline: 06/17/2023= 160 feet with bariatric 4WW in 9 min- stopped due to fatigue. 07/17/2023- Patient able to walk 270 feet in > 20 min with BRW, CGA and close w/c follow today- VC for posture and for gait sequencing; 08/21/2023- Not specifically testing but patient has been walking 160 feet or more each Visit; 09/09/2023= Patient ambulated 180 feet overall at one time today using bariatric RW, CGA with VC for erect posture and for increased step length.  10/02/2023= 165 feet with Bariatric RW- close w/c follow and CGA. More diffiuclty with gait sequencing more consistent VC for erect posture.  12/02/2023- Paitent ambulated >100 feet in // bars today but will reassess using walker next visit.  12/11/2023= Patient ambulated 204 feet with BRW and Close CGA and w/c follow 01/29/24: Patient ambulated 111 feet with BRW and  Close CGA and w/c follow (10 min 59 sec) before needing to rest 02/24/2024= Patient ambulated 180 feet with BRW and Close CGA.  03/16/2024= 188 feet with BRW and close CGA Goal Status: PROGRESSING  8. Patient will report ability to walk out his front door including stepping over threshold using his walker with min A of caregiver to enable him to be more independent with household mobility and be able to go out to porch with less dependence on caregivers. Baseline: Patient dependent on caregivers to push him in his wheelchair to go out to porch and reports dependent on them to always assist so he doesn't go outside like he would like to. 01/29/24: Pt reports he has not been able to attempt this due to not having a male at home to assist him. 02/24/2024: Patient reports has been able to step outside on his porch a couple of time with use of walker and close caregiver support.  Goal status: MET  9. Patient will stand at kitchen counter or support bar for > 5 min without UE support to demonstrate improved standing posture/endurance/LE strength with standing ADLs to assist with toileting, changing clothes, or light kitchen activities.  Baseline: Patient able to stand approx 2.5 min without posterior lean and no UE support.  Goal status: NEW  ASSESSMENT :  CLINICAL IMPRESSION:   Patient presents well motivated for progress report today. He was  able to demonstrate some progress with TUG - having his best times posted today. He was able to demonstrate improved LE strength as seen by improved time with 5 x sit to stand. He struggled at times with taking adequate steps bwd and will benfit from continued training for safety with all household mobility. Patient's condition has the potential to improve in response to therapy. Maximum improvement is yet to be obtained. The anticipated improvement is attainable and reasonable in a generally predictable time.  Pt will continue to benefit from skilled therapy to  address remaining deficits in order to improve overall QoL and return to PLOF.       OBJECTIVE IMPAIRMENTS Abnormal gait, decreased activity tolerance, decreased balance, decreased coordination, decreased endurance, decreased mobility, difficulty walking, decreased ROM, decreased strength, improper body mechanics, postural dysfunction, and obesity.   ACTIVITY LIMITATIONS carrying, lifting, bending, sitting, standing, squatting, stairs, transfers, bed mobility, continence, bathing, toileting, dressing, hygiene/grooming, and locomotion level  PARTICIPATION LIMITATIONS: meal prep, cleaning, laundry, interpersonal relationship, driving, shopping, community activity, and yard work  PERSONAL FACTORS Fitness, Past/current experiences, Time since onset of injury/illness/exacerbation, and 3+ comorbidities: hx of brain tumor, past CVA, HTN, obesity, learning disability, DMII are also affecting patient's functional outcome.   REHAB POTENTIAL: Fair    CLINICAL DECISION MAKING: Stable/uncomplicated  EVALUATION COMPLEXITY: Low  PLAN: PT FREQUENCY: 1-2x/week  PT DURATION: 12 weeks  PLANNED INTERVENTIONS: Therapeutic exercises, Therapeutic activity, Neuromuscular re-education, Balance training, Gait training, Patient/Family education, Joint mobilization, Stair training, Vestibular training, Orthotic/Fit training, DME instructions, Dry Needling, Electrical stimulation, Wheelchair mobility training, Cryotherapy, Moist heat, Compression bandaging, Manual therapy, and Re-evaluation  PLAN FOR NEXT SESSION:  - gait with 180 deg turn -Retro walking -Gait training with BWS system as appropriate.  -Bridging and review of some bed LE strengthening -Continue with functional strengthening exercises of the LE's  -Gait activities using RW. Incorporate proper technique with all transfers to decrease his risk of falling.   Reyes LOISE London PT  Physical Therapist- [place]  [facility]    1:32 PM 03/18/2024

## 2024-03-23 ENCOUNTER — Ambulatory Visit

## 2024-03-25 ENCOUNTER — Ambulatory Visit

## 2024-03-25 DIAGNOSIS — M6281 Muscle weakness (generalized): Secondary | ICD-10-CM

## 2024-03-25 DIAGNOSIS — R262 Difficulty in walking, not elsewhere classified: Secondary | ICD-10-CM

## 2024-03-25 DIAGNOSIS — R269 Unspecified abnormalities of gait and mobility: Secondary | ICD-10-CM

## 2024-03-25 DIAGNOSIS — R296 Repeated falls: Secondary | ICD-10-CM

## 2024-03-25 DIAGNOSIS — R2681 Unsteadiness on feet: Secondary | ICD-10-CM

## 2024-03-25 DIAGNOSIS — I69351 Hemiplegia and hemiparesis following cerebral infarction affecting right dominant side: Secondary | ICD-10-CM

## 2024-03-25 DIAGNOSIS — R278 Other lack of coordination: Secondary | ICD-10-CM

## 2024-03-25 DIAGNOSIS — R2689 Other abnormalities of gait and mobility: Secondary | ICD-10-CM

## 2024-03-25 DIAGNOSIS — R531 Weakness: Secondary | ICD-10-CM

## 2024-03-25 NOTE — Therapy (Signed)
 OUTPATIENT PHYSICAL THERAPY TREATMENT/Physical Therapy Progress Note   Dates of reporting period  01/29/2024   to   03/18/2024   Patient Name: Curtis Powell MRN: [id number] DOB:Feb 20, 1966, 58 y.o., male Today's Date: 03/25/2024  PCP: Sherial Bail, MD  REFERRING PROVIDER: Sherial Bail, MD    PT End of Session - 03/25/24 1310     Visit Number 131    Number of Visits 150    Date for Recertification  05/20/24    Progress Note Due on Visit 140    PT Start Time 1315    Equipment Utilized During Treatment Gait belt    Activity Tolerance Patient tolerated treatment well    Behavior During Therapy [facility] for tasks assessed/performed            Past Medical History:  Diagnosis Date   Asthma    GERD (gastroesophageal reflux disease)    Hyperlipidemia    Hypertension    Obesity    Stroke [facility])    Past Surgical History:  Procedure Laterality Date   BRAIN SURGERY     Patient Active Problem List   Diagnosis Date Noted   Impaired glucose tolerance 03/23/2020   Learning disability 03/23/2020   Urinary and fecal incontinence 06/14/2019   Chronic pain syndrome 03/23/2019   Constipation 03/23/2019   Dysarthria 03/23/2019   Dysphagia 03/23/2019   Learning difficulty 03/23/2019   Morbid obesity (HCC) 03/23/2019   Muscle weakness 03/23/2019   Vitamin D deficiency 03/23/2019   Bilateral carotid artery stenosis 12/15/2018   Moderate aortic valve stenosis 12/15/2018   BMI 45.0-49.9, adult (HCC) 11/10/2018   Cerebrovascular accident (HCC) 05/02/2016   Hemiparesis affecting right side as late effect of cerebrovascular accident (HCC) 05/01/2016   Brain tumor (HCC) 11/24/2014   Gastro-esophageal reflux disease without esophagitis 11/24/2014   Accident due to mechanical fall without injury 08/03/2014   Essential hypertension 06/13/2014   Mixed hyperlipidemia 06/13/2014   Unspecified sequelae of cerebral infarction 06/13/2014   Thyromegaly 01/07/2014   Type 2 diabetes  mellitus without complication 01/06/2014   Neck mass 09/08/2013   Swimmer's ear 09/08/2013   Erectile dysfunction 01/03/2013   Prediabetes 01/03/2013   ONSET DATE: 05/09/2016  REFERRING DIAG: I69.351 (ICD-10-CM) - Hemiplegia and hemiparesis following cerebral infarction affecting right dominant side   THERAPY DIAG:  Unsteadiness on feet  Difficulty in walking, not elsewhere classified  Muscle weakness (generalized)  Abnormality of gait and mobility  Other abnormalities of gait and mobility  Repeated falls  Hemiplegia and hemiparesis following cerebral infarction affecting right dominant side (HCC)  Left-sided weakness  Other lack of coordination   Rationale for Evaluation and Treatment Rehabilitation  SUBJECTIVE:  SUBJECTIVE STATEMENT   Patient reports having issues with his wheelchair including left brake and the handles are very loose on the back  Pt accompanied by: family member   PERTINENT HISTORY:  The pt is a pleasant 58 yo male who returns to PT for evaluation of deficits s/o old CVA. Pt with hx of CVA Feb 2018. He was previously fully independent, but CVA caused significant deficits, mainly affecting his right side. Pt reports continued difficulty with walking and transfers. Pt still using WC for majority of mobility, otherwise he uses his RW. Pt lives with his mom who is his main caregiver. Patient had outpatient PT previously in clinic in 2023. He reports since discharge he has been completing his HEP and walking about every day. Pt would like to improve his walking mainly, but also would like to work on balance. He also reports difficulty with transfers. Pt's mom and his caregiver helps him with ADLs. However, he says he is now waiting for a new caregiver. Prior to this pt had  caregiver 3x/week. PMH significant for hx of brain tumor, past CVA, DMII and HTN. See chart for additional information.  PAIN:  Are you having pain? Pt reports no pain currently  PRECAUTIONS: fall  WEIGHT BEARING RESTRICTIONS none  PLOF: required assistance with ADLs (old CVA), prior to CVA in 2018 was indep.  History of Falls: Pt reports no recent falls  Home information: Pt lives with mother who is his primary caregiver. Previously had home aide, 3x/week but reports currently waiting for new one.  PATIENT GOALS: Pt would like to improve his ability to ambulate.  OBJECTIVE:  taken at [facility] unless otherwise noted:   DIAGNOSTIC FINDINGS: No recent imaging per chart  Objective measures taken at eval unless otherwise specified:   Posture: forward head posture, rounded shoulders  MMT LE: grossly  4+/5, exception hip flexors and abductors 4/5 each bilat   Functional tests:  5xSTS:  34 sec, use of BUE (multiple initial attempts with posterior LOB in chair)   : 0.096 m/s with RW, close CGA, and WC follow  Gait for distance (goal 150 ft): deferred due to time  Gait mechanics: impaired, decreased speed (see ), crouched posture, poor postural stability and relies heavily on BUE support on RW. Pt now with bilat AFOS   FOTO: 44 (goal 50)   TODAY'S TREATMENT : 03/25/24   TA: Sit to stand with 1 UE on railing of // bar and 1 on armrest- x 10 reps    Static standing in // bars with and without UE support- totaling 5 min - 30 sec hands off and 30 sec hands on x 2 trials  Static stand in // bars - palming a green ball with right hand then reaching toward midline and exchange to Left side and then reach out x 10.   Small Self toss (minimal height) with BUE while dual task of static stand.   Reaching over 1 side of bar to take cone off side of bars then exchange to opp hand and place cone on side bar of // bars x 5 then brief rest followed by 5 more trials.   GAIT:   Fwd  walk in // bars focusing on 180 deg turning - down and back x 4. Mild retro LOB requiring close CGA   Retro walking x 10 feet x 4 trials with rest between using BRW   PATIENT EDUCATION: Education details: If pt can lean pelvis into support surface, his energy efficicency  will be higher.  Person educated: Patient Education method: Explanation, Demonstration, Tactile cues, and Verbal cues Education comprehension: verbalized understanding, returned demonstration, and verbal cues required  HOME EXERCISE PROGRAM:  Standing hip flexor stretch at counter, prolonged standing with erect posture for gluteal activation, HS curls with RTB  Access Code: [email or web address] URL: [email or web address] Date: 05/15/2023 Prepared by: Connell Kiss  Exercises - Seated Long Arc Quad with Ankle Weight  - 1 x daily - 7 x weekly - 2 sets - 10 reps  GOALS: Goals reviewed with patient? No  SHORT TERM GOALS: Target date: 11/12/2022  Pt will be independent with HEP in order to improve strength and balance in order to decrease fall risk and improve function at home. Baseline: instructed in HEP today, to provide printout next visit Goal status: MET  LONG TERM GOALS: Target date: 02/24/2024  Pt will increase FOTO to at least 50 in order to demonstrate improvement in function related to mobility and QoL. Baseline: 44 7/29:48 9/10:40 12/24/2022= 51 Goal status: MET  2.  Pt (< 60 yrs old) will complete 5xSTS test < 30 seconds indicating an increase in LE strength and improved balance. Baseline: 10/01/22 34 sec (multiple initial attempts with posterior LOB in chair) 12/20: 49 seconds 11/04/22:1:06, min A on multiple attempts to maintain balance and pt stays in flexed posture throughout test when coming to standing.   9/10: 27.5 sec, Pushes WC post consistently, close CGA  10/22: 47 seconds first attempt 12/5: 1 minute, 37 seconds, multiple attempts to get first stand, but no difficulty after that; 03/20/2023=  1 min 2 sec with 1UE on walker and 1 on armrest- some posterior lean- Close CGA; 06/17/2023= 38 sec with 1 UE on walker and 1 on armrest; 08/21/2023= 35 sec with 1UE on walker and 1 on armrest.  8/5: 36 sec with 1 UE on walker 1 on arm rest; 12/23/2023= 37.05 sec with some posterior LOB- CGA to min A to correct.  01/29/24: 54.77 sec with significant posterior LOB and needing modA to come upright into standing 02/26/24: 46 sec with some posterior LOB on 2 out of 5 stands- min A to recover and stand 03/18/2024= 36 sec from green chair rather than seat of w/c with 1 arm on armrest and the other on walker-  Goal status: ONGOING  3.  Pt will increase to > 0.5 m/s (30 sec) as to improve gait speed for better home ambulation and reduce fall risk. Baseline: 10/01/22 0.096 m/s with RW, close CGA, and WC follow previously 12/27: 0.11 m/s 11/04/22: .056 m/s  9/10: 076 m/s 10/22: 0.64 m/s  11/26: .076 m/s; 03/20/2023= 0.08 m/s; 04/22/2023= 0.089 m/s; 05/15/2023 = 0.079 m/s; 05/27/2023= 0.11 m/s; 07/17/2023= 0.15 m/s; 08/21/2023= 0.14 m/s avg with Bariatric walker; 09/09/2023= 0.14 m/s; 10/02/2023= 0.09 m/s with Bariatric walker and Increased cues to swing RLE through vs. Previous sesisons. 11/13/2023= 0.12 m/s with Bariatric walker; 12/11/2023= 0.14 m/s; 12/23/2023=0.135 m/s 01/29/24: 3 min 25 sec, 0.05 m/s 02/26/2024: 0.14 m/s avg with BRW  Goal status: NOT PROGRESSING  4.  Pt will increase hip flexor and abductor  gross strength to 4+/5 bilat as to improve functional strength for independent gait, increased standing tolerance, and increased ADL ability. Baseline: 10/01/22 4+/5, exception hip flexors and abductors 4/5 each bilat  (previously 03/27/22: 4+/5 ) 9/10: Hip flexor 4/5, hip abductor 4+/5 10/22: hip flexor 4/5 hip abductor 4+/5 11/26: hip flexor 4+/5 hip abductor 4+/5 Goal status: MET  5.  Pt will ambulate 150  ft with BRW and WC follow with no rest breaks in less than 12 min to allow for improved mobility  within home and increased independence. Baseline: 10/03/22 150' with BRW 04/03/22: 105 ft with BRW 7/29: 100 in 9:30 with pediatric RW 9/10:101 ft with BRW stooped posture worsened throughout test 12/24/2022- 150 feet using BRW, CGA with w/c follow- in 10 min 17 sec. Will keep goal to make sure he can do this consistently 10/22: 119 ft with BRW 8 min 23 seconds terminated by patient  11/26: 148 ft with BRW 14 min 34 seconds terminated by patient 03/20/2023= 152 feet using BRW, CGA and w/c follow in 14 min 3 sec- Need to keep goal active to ensure consistency but will revised and add a time element to goal. 04/22/2023= 162 feet in 11 min 54 sec using bariatric RW, CGA and close CGA assist; 05/27/2023- 170 feet without rest break using bariatric RW. 06/17/2023= 160 feet with bariatric 4WW in 9 min- stopped due to fatigue.  Goal status: MET   6. Pt will decrease TUG to below 1 min 30 seconds/decrease in order to demonstrate decreased fall risk. Baseline: 06/17/2023= 2 min using bariatric 4WW; 09/09/2023= 1 min 50 sec with bariatric front wheeled walker.  12/02/2023= 1 min 44 sec;  12/23/2023= 2 min 8 sec avg with BRW 01/29/24: 3 min 42 sec with BRW 02/26/24: 1 min 58 sec with BRW and max VC for turning. 03/18/24: 1 min 45 sec with BRW and mod VC for turning Goal status: PROGRESSING   7. Pt will ambulate 300 ft with BRW, CGA and WC follow with no rest breaks in less than 20 min to allow for improved mobility within home/community and increased independence. Baseline: 06/17/2023= 160 feet with bariatric 4WW in 9 min- stopped due to fatigue. 07/17/2023- Patient able to walk 270 feet in > 20 min with BRW, CGA and close w/c follow today- VC for posture and for gait sequencing; 08/21/2023- Not specifically testing but patient has been walking 160 feet or more each Visit; 09/09/2023= Patient ambulated 180 feet overall at one time today using bariatric RW, CGA with VC for erect posture and for increased step length.   10/02/2023= 165 feet with Bariatric RW- close w/c follow and CGA. More diffiuclty with gait sequencing more consistent VC for erect posture.  12/02/2023- Paitent ambulated >100 feet in // bars today but will reassess using walker next visit.  12/11/2023= Patient ambulated 204 feet with BRW and Close CGA and w/c follow 01/29/24: Patient ambulated 111 feet with BRW and Close CGA and w/c follow (10 min 59 sec) before needing to rest 02/24/2024= Patient ambulated 180 feet with BRW and Close CGA.  03/16/2024= 188 feet with BRW and close CGA Goal Status: PROGRESSING  8. Patient will report ability to walk out his front door including stepping over threshold using his walker with min A of caregiver to enable him to be more independent with household mobility and be able to go out to porch with less dependence on caregivers. Baseline: Patient dependent on caregivers to push him in his wheelchair to go out to porch and reports dependent on them to always assist so he doesn't go outside like he would like to. 01/29/24: Pt reports he has not been able to attempt this due to not having a male at home to assist him. 02/24/2024: Patient reports has been able to step outside on his porch a couple of time with use of walker and close caregiver support.  Goal status: MET  9. Patient will stand at kitchen counter or support bar for > 5 min without UE support to demonstrate improved standing posture/endurance/LE strength with standing ADLs to assist with toileting, changing clothes, or light kitchen activities.  Baseline: Patient able to stand approx 2.5 min without posterior lean and no UE support.  Goal status: NEW  ASSESSMENT :  CLINICAL IMPRESSION:   Patient presents well motivated for progress report today. He was able to demonstrate some progress with TUG - having his best times posted today. He was able to demonstrate improved LE strength as seen by improved time with 5 x sit to stand. He struggled at times  with taking adequate steps bwd and will benfit from continued training for safety with all household mobility. Patient's condition has the potential to improve in response to therapy. Maximum improvement is yet to be obtained. The anticipated improvement is attainable and reasonable in a generally predictable time.  Pt will continue to benefit from skilled therapy to address remaining deficits in order to improve overall QoL and return to PLOF.       OBJECTIVE IMPAIRMENTS Abnormal gait, decreased activity tolerance, decreased balance, decreased coordination, decreased endurance, decreased mobility, difficulty walking, decreased ROM, decreased strength, improper body mechanics, postural dysfunction, and obesity.   ACTIVITY LIMITATIONS carrying, lifting, bending, sitting, standing, squatting, stairs, transfers, bed mobility, continence, bathing, toileting, dressing, hygiene/grooming, and locomotion level  PARTICIPATION LIMITATIONS: meal prep, cleaning, laundry, interpersonal relationship, driving, shopping, community activity, and yard work  PERSONAL FACTORS Fitness, Past/current experiences, Time since onset of injury/illness/exacerbation, and 3+ comorbidities: hx of brain tumor, past CVA, HTN, obesity, learning disability, DMII are also affecting patient's functional outcome.   REHAB POTENTIAL: Fair    CLINICAL DECISION MAKING: Stable/uncomplicated  EVALUATION COMPLEXITY: Low  PLAN: PT FREQUENCY: 1-2x/week  PT DURATION: 12 weeks  PLANNED INTERVENTIONS: Therapeutic exercises, Therapeutic activity, Neuromuscular re-education, Balance training, Gait training, Patient/Family education, Joint mobilization, Stair training, Vestibular training, Orthotic/Fit training, DME instructions, Dry Needling, Electrical stimulation, Wheelchair mobility training, Cryotherapy, Moist heat, Compression bandaging, Manual therapy, and Re-evaluation  PLAN FOR NEXT SESSION:  - gait with 180 deg turn -Retro  walking -Gait training with BWS system as appropriate.  -Bridging and review of some bed LE strengthening -Continue with functional strengthening exercises of the LE's  -Gait activities using RW. Incorporate proper technique with all transfers to decrease his risk of falling.   Reyes LOISE London PT  Physical Therapist- [place]  [facility]   1:30 PM 03/25/2024

## 2024-03-30 ENCOUNTER — Ambulatory Visit

## 2024-03-30 DIAGNOSIS — R2681 Unsteadiness on feet: Secondary | ICD-10-CM

## 2024-03-30 DIAGNOSIS — R2689 Other abnormalities of gait and mobility: Secondary | ICD-10-CM

## 2024-03-30 DIAGNOSIS — M6281 Muscle weakness (generalized): Secondary | ICD-10-CM

## 2024-03-30 DIAGNOSIS — R262 Difficulty in walking, not elsewhere classified: Secondary | ICD-10-CM

## 2024-03-30 DIAGNOSIS — R296 Repeated falls: Secondary | ICD-10-CM

## 2024-03-30 DIAGNOSIS — R278 Other lack of coordination: Secondary | ICD-10-CM

## 2024-03-30 DIAGNOSIS — I69351 Hemiplegia and hemiparesis following cerebral infarction affecting right dominant side: Secondary | ICD-10-CM

## 2024-03-30 DIAGNOSIS — R531 Weakness: Secondary | ICD-10-CM

## 2024-03-30 DIAGNOSIS — R269 Unspecified abnormalities of gait and mobility: Secondary | ICD-10-CM

## 2024-03-30 NOTE — Therapy (Signed)
 "  OUTPATIENT PHYSICAL THERAPY TREATMENT   Patient Name: Curtis Powell MRN: [id number] DOB:[birth year], 58 y.o., male Today's Date: 03/30/2024  PCP: Sherial Bail, MD  REFERRING PROVIDER: Sherial Bail, MD    PT End of Session - 03/30/24 1326     Visit Number 132    Number of Visits 150    Date for Recertification  05/20/24    Progress Note Due on Visit 140    PT Start Time 1316    PT Stop Time 1359    PT Time Calculation (min) 43 min    Equipment Utilized During Treatment Gait belt    Activity Tolerance Patient tolerated treatment well    Behavior During Therapy WFL for tasks assessed/performed            Past Medical History:  Diagnosis Date   Asthma    GERD (gastroesophageal reflux disease)    Hyperlipidemia    Hypertension    Obesity    Stroke [facility])    Past Surgical History:  Procedure Laterality Date   BRAIN SURGERY     Patient Active Problem List   Diagnosis Date Noted   Impaired glucose tolerance 03/23/2020   Learning disability 03/23/2020   Urinary and fecal incontinence 06/14/2019   Chronic pain syndrome 03/23/2019   Constipation 03/23/2019   Dysarthria 03/23/2019   Dysphagia 03/23/2019   Learning difficulty 03/23/2019   Morbid obesity (HCC) 03/23/2019   Muscle weakness 03/23/2019   Vitamin D deficiency 03/23/2019   Bilateral carotid artery stenosis 12/15/2018   Moderate aortic valve stenosis 12/15/2018   BMI 45.0-49.9, adult (HCC) 11/10/2018   Cerebrovascular accident (HCC) 05/02/2016   Hemiparesis affecting right side as late effect of cerebrovascular accident (HCC) 05/01/2016   Brain tumor (HCC) 11/24/2014   Gastro-esophageal reflux disease without esophagitis 11/24/2014   Accident due to mechanical fall without injury 08/03/2014   Essential hypertension 06/13/2014   Mixed hyperlipidemia 06/13/2014   Unspecified sequelae of cerebral infarction 06/13/2014   Thyromegaly 01/07/2014   Type 2 diabetes mellitus without complication  01/06/2014   Neck mass 09/08/2013   Swimmer's ear 09/08/2013   Erectile dysfunction 01/03/2013   Prediabetes 01/03/2013   ONSET DATE: 05/09/2016  REFERRING DIAG: I69.351 (ICD-10-CM) - Hemiplegia and hemiparesis following cerebral infarction affecting right dominant side   THERAPY DIAG:  Unsteadiness on feet  Difficulty in walking, not elsewhere classified  Muscle weakness (generalized)  Abnormality of gait and mobility  Other abnormalities of gait and mobility  Repeated falls  Hemiplegia and hemiparesis following cerebral infarction affecting right dominant side (HCC)  Left-sided weakness  Other lack of coordination   Rationale for Evaluation and Treatment Rehabilitation  SUBJECTIVE:  SUBJECTIVE STATEMENT  Patient reports ready for Christmas and states not too sore after last visit.    Pt accompanied by: family member   PERTINENT HISTORY:  The pt is a pleasant 58 yo male who returns to PT for evaluation of deficits s/o old CVA. Pt with hx of CVA Feb 2018. He was previously fully independent, but CVA caused significant deficits, mainly affecting his right side. Pt reports continued difficulty with walking and transfers. Pt still using WC for majority of mobility, otherwise he uses his RW. Pt lives with his mom who is his main caregiver. Patient had outpatient PT previously in clinic in 2023. He reports since discharge he has been completing his HEP and walking about every day. Pt would like to improve his walking mainly, but also would like to work on balance. He also reports difficulty with transfers. Pt's mom and his caregiver helps him with ADLs. However, he says he is now waiting for a new caregiver. Prior to this pt had caregiver 3x/week. PMH significant for hx of brain tumor, past CVA,  DMII and HTN. See chart for additional information.  PAIN:  Are you having pain? Pt reports no pain currently  PRECAUTIONS: fall  WEIGHT BEARING RESTRICTIONS none  PLOF: required assistance with ADLs (old CVA), prior to CVA in 2018 was indep.  History of Falls: Pt reports no recent falls  Home information: Pt lives with mother who is his primary caregiver. Previously had home aide, 3x/week but reports currently waiting for new one.  PATIENT GOALS: Pt would like to improve his ability to ambulate.  OBJECTIVE:  taken at [facility] unless otherwise noted:   DIAGNOSTIC FINDINGS: No recent imaging per chart  Objective measures taken at eval unless otherwise specified:   Posture: forward head posture, rounded shoulders  MMT LE: grossly  4+/5, exception hip flexors and abductors 4/5 each bilat   Functional tests:  5xSTS:  34 sec, use of BUE (multiple initial attempts with posterior LOB in chair)   : 0.096 m/s with RW, close CGA, and WC follow  Gait for distance (goal 150 ft): deferred due to time  Gait mechanics: impaired, decreased speed (see ), crouched posture, poor postural stability and relies heavily on BUE support on RW. Pt now with bilat AFOS   FOTO: 44 (goal 50)   TODAY'S TREATMENT : 03/30/24   TA: Sit to stand with 1 UE on railing of // bar and 1 on armrest- x 10 reps with VC only to lean forward    NMR: Blaze pods- standing in // bars with 5 pods positioned on tray table in front of patient with 2 colors (red/green) - instructed to tap green blaze pod with left UE and Right with Red. Patient performed several rounds at 1 min each working on static standing through LE's and reaction time, standing balance, and ability to dynamically work his UE while 100% Weight bearing through [employer] - Patient performed 3 rounds per stands - totaling 4 separate stands  GAIT:   Fwd walk in // bars focusing on 180 deg turning - down and back x2 for 2 different. Mild retro LOB  requiring close CGA   Retro walking x 10 feet x 2 trials in // bars focusing on increased step length.    PATIENT EDUCATION: Education details: If pt can lean pelvis into support surface, his energy efficicency will be higher.  Person educated: Patient Education method: Explanation, Demonstration, Tactile cues, and Verbal cues Education comprehension: verbalized understanding, returned demonstration, and  verbal cues required  HOME EXERCISE PROGRAM:  Standing hip flexor stretch at counter, prolonged standing with erect posture for gluteal activation, HS curls with RTB  Access Code: [email or web address] URL: [email or web address] Date: 05/15/2023 Prepared by: Connell Kiss  Exercises - Seated Long Arc Quad with Ankle Weight  - 1 x daily - 7 x weekly - 2 sets - 10 reps  GOALS: Goals reviewed with patient? No  SHORT TERM GOALS: Target date: 11/12/2022  Pt will be independent with HEP in order to improve strength and balance in order to decrease fall risk and improve function at home. Baseline: instructed in HEP today, to provide printout next visit Goal status: MET  LONG TERM GOALS: Target date: 02/24/2024  Pt will increase FOTO to at least 50 in order to demonstrate improvement in function related to mobility and QoL. Baseline: 44 7/29:48 9/10:40 12/24/2022= 51 Goal status: MET  2.  Pt (< 60 yrs old) will complete 5xSTS test < 30 seconds indicating an increase in LE strength and improved balance. Baseline: 10/01/22 34 sec (multiple initial attempts with posterior LOB in chair) 12/20: 49 seconds 11/04/22:1:06, min A on multiple attempts to maintain balance and pt stays in flexed posture throughout test when coming to standing.   9/10: 27.5 sec, Pushes WC post consistently, close CGA  10/22: 47 seconds first attempt 12/5: 1 minute, 37 seconds, multiple attempts to get first stand, but no difficulty after that; 03/20/2023= 1 min 2 sec with 1UE on walker and 1 on armrest- some  posterior lean- Close CGA; 06/17/2023= 38 sec with 1 UE on walker and 1 on armrest; 08/21/2023= 35 sec with 1UE on walker and 1 on armrest.  8/5: 36 sec with 1 UE on walker 1 on arm rest; 12/23/2023= 37.05 sec with some posterior LOB- CGA to min A to correct.  01/29/24: 54.77 sec with significant posterior LOB and needing modA to come upright into standing 02/26/24: 46 sec with some posterior LOB on 2 out of 5 stands- min A to recover and stand 03/18/2024= 36 sec from green chair rather than seat of w/c with 1 arm on armrest and the other on walker-  Goal status: ONGOING  3.  Pt will increase to > 0.5 m/s (30 sec) as to improve gait speed for better home ambulation and reduce fall risk. Baseline: 10/01/22 0.096 m/s with RW, close CGA, and WC follow previously 12/27: 0.11 m/s 11/04/22: .056 m/s  9/10: 076 m/s 10/22: 0.64 m/s  11/26: .076 m/s; 03/20/2023= 0.08 m/s; 04/22/2023= 0.089 m/s; 05/15/2023 = 0.079 m/s; 05/27/2023= 0.11 m/s; 07/17/2023= 0.15 m/s; 08/21/2023= 0.14 m/s avg with Bariatric walker; 09/09/2023= 0.14 m/s; 10/02/2023= 0.09 m/s with Bariatric walker and Increased cues to swing RLE through vs. Previous sesisons. 11/13/2023= 0.12 m/s with Bariatric walker; 12/11/2023= 0.14 m/s; 12/23/2023=0.135 m/s 01/29/24: 3 min 25 sec, 0.05 m/s 02/26/2024: 0.14 m/s avg with BRW  Goal status: NOT PROGRESSING  4.  Pt will increase hip flexor and abductor  gross strength to 4+/5 bilat as to improve functional strength for independent gait, increased standing tolerance, and increased ADL ability. Baseline: 10/01/22 4+/5, exception hip flexors and abductors 4/5 each bilat  (previously 03/27/22: 4+/5 ) 9/10: Hip flexor 4/5, hip abductor 4+/5 10/22: hip flexor 4/5 hip abductor 4+/5 11/26: hip flexor 4+/5 hip abductor 4+/5 Goal status: MET  5.  Pt will ambulate 150 ft with BRW and WC follow with no rest breaks in less than 12 min to allow for improved mobility within home and  increased independence. Baseline: 10/03/22  150' with BRW 04/03/22: 105 ft with BRW 7/29: 100 in 9:30 with pediatric RW 9/10:101 ft with BRW stooped posture worsened throughout test 12/24/2022- 150 feet using BRW, CGA with w/c follow- in 10 min 17 sec. Will keep goal to make sure he can do this consistently 10/22: 119 ft with BRW 8 min 23 seconds terminated by patient  11/26: 148 ft with BRW 14 min 34 seconds terminated by patient 03/20/2023= 152 feet using BRW, CGA and w/c follow in 14 min 3 sec- Need to keep goal active to ensure consistency but will revised and add a time element to goal. 04/22/2023= 162 feet in 11 min 54 sec using bariatric RW, CGA and close CGA assist; 05/27/2023- 170 feet without rest break using bariatric RW. 06/17/2023= 160 feet with bariatric 4WW in 9 min- stopped due to fatigue.  Goal status: MET   6. Pt will decrease TUG to below 1 min 30 seconds/decrease in order to demonstrate decreased fall risk. Baseline: 06/17/2023= 2 min using bariatric 4WW; 09/09/2023= 1 min 50 sec with bariatric front wheeled walker.  12/02/2023= 1 min 44 sec;  12/23/2023= 2 min 8 sec avg with BRW 01/29/24: 3 min 42 sec with BRW 02/26/24: 1 min 58 sec with BRW and max VC for turning. 03/18/24: 1 min 45 sec with BRW and mod VC for turning Goal status: PROGRESSING   7. Pt will ambulate 300 ft with BRW, CGA and WC follow with no rest breaks in less than 20 min to allow for improved mobility within home/community and increased independence. Baseline: 06/17/2023= 160 feet with bariatric 4WW in 9 min- stopped due to fatigue. 07/17/2023- Patient able to walk 270 feet in > 20 min with BRW, CGA and close w/c follow today- VC for posture and for gait sequencing; 08/21/2023- Not specifically testing but patient has been walking 160 feet or more each Visit; 09/09/2023= Patient ambulated 180 feet overall at one time today using bariatric RW, CGA with VC for erect posture and for increased step length.  10/02/2023= 165 feet with Bariatric RW- close w/c follow and  CGA. More diffiuclty with gait sequencing more consistent VC for erect posture.  12/02/2023- Paitent ambulated >100 feet in // bars today but will reassess using walker next visit.  12/11/2023= Patient ambulated 204 feet with BRW and Close CGA and w/c follow 01/29/24: Patient ambulated 111 feet with BRW and Close CGA and w/c follow (10 min 59 sec) before needing to rest 02/24/2024= Patient ambulated 180 feet with BRW and Close CGA.  03/16/2024= 188 feet with BRW and close CGA Goal Status: PROGRESSING  8. Patient will report ability to walk out his front door including stepping over threshold using his walker with min A of caregiver to enable him to be more independent with household mobility and be able to go out to porch with less dependence on caregivers. Baseline: Patient dependent on caregivers to push him in his wheelchair to go out to porch and reports dependent on them to always assist so he doesn't go outside like he would like to. 01/29/24: Pt reports he has not been able to attempt this due to not having a male at home to assist him. 02/24/2024: Patient reports has been able to step outside on his porch a couple of time with use of walker and close caregiver support.  Goal status: MET  9. Patient will stand at kitchen counter or support bar for > 5 min without UE support to demonstrate  improved standing posture/endurance/LE strength with standing ADLs to assist with toileting, changing clothes, or light kitchen activities.  Baseline: Patient able to stand approx 2.5 min without posterior lean and no UE support.  Goal status: NEW  ASSESSMENT :  CLINICAL IMPRESSION:   Treatment continued per plan of care focusing on LE weightbearing activities including performed some dual activities with blaze pods  to continue to emphasize weight bearing more through LE without UE support. He was fatigued and more challenged with reaching with RUE support. Pt will continue to benefit from skilled therapy  to address remaining deficits in order to improve overall QoL and return to PLOF.       OBJECTIVE IMPAIRMENTS Abnormal gait, decreased activity tolerance, decreased balance, decreased coordination, decreased endurance, decreased mobility, difficulty walking, decreased ROM, decreased strength, improper body mechanics, postural dysfunction, and obesity.   ACTIVITY LIMITATIONS carrying, lifting, bending, sitting, standing, squatting, stairs, transfers, bed mobility, continence, bathing, toileting, dressing, hygiene/grooming, and locomotion level  PARTICIPATION LIMITATIONS: meal prep, cleaning, laundry, interpersonal relationship, driving, shopping, community activity, and yard work  PERSONAL FACTORS Fitness, Past/current experiences, Time since onset of injury/illness/exacerbation, and 3+ comorbidities: hx of brain tumor, past CVA, HTN, obesity, learning disability, DMII are also affecting patient's functional outcome.   REHAB POTENTIAL: Fair    CLINICAL DECISION MAKING: Stable/uncomplicated  EVALUATION COMPLEXITY: Low  PLAN: PT FREQUENCY: 1-2x/week  PT DURATION: 12 weeks  PLANNED INTERVENTIONS: Therapeutic exercises, Therapeutic activity, Neuromuscular re-education, Balance training, Gait training, Patient/Family education, Joint mobilization, Stair training, Vestibular training, Orthotic/Fit training, DME instructions, Dry Needling, Electrical stimulation, Wheelchair mobility training, Cryotherapy, Moist heat, Compression bandaging, Manual therapy, and Re-evaluation  PLAN FOR NEXT SESSION:  - gait with 180 deg turn -Retro walking -Gait training with BWS system as appropriate.  --Continue with functional strengthening exercises of the LE's  -Gait activities using RW. Incorporate proper technique with all transfers to decrease his risk of falling.   Reyes LOISE London PT  Physical Therapist- [facility]   2:23 PM 03/30/2024           "

## 2024-04-06 ENCOUNTER — Ambulatory Visit

## 2024-04-06 DIAGNOSIS — I69351 Hemiplegia and hemiparesis following cerebral infarction affecting right dominant side: Secondary | ICD-10-CM

## 2024-04-06 DIAGNOSIS — M6281 Muscle weakness (generalized): Secondary | ICD-10-CM

## 2024-04-06 DIAGNOSIS — R2689 Other abnormalities of gait and mobility: Secondary | ICD-10-CM

## 2024-04-06 DIAGNOSIS — R262 Difficulty in walking, not elsewhere classified: Secondary | ICD-10-CM

## 2024-04-06 DIAGNOSIS — R2681 Unsteadiness on feet: Secondary | ICD-10-CM

## 2024-04-06 DIAGNOSIS — R531 Weakness: Secondary | ICD-10-CM

## 2024-04-06 DIAGNOSIS — R269 Unspecified abnormalities of gait and mobility: Secondary | ICD-10-CM

## 2024-04-06 DIAGNOSIS — R296 Repeated falls: Secondary | ICD-10-CM

## 2024-04-06 NOTE — Therapy (Signed)
 "  OUTPATIENT PHYSICAL THERAPY TREATMENT   Patient Name: Curtis Powell MRN: [id number] DOB:08-03-1965, 58 y.o., male Today's Date: 04/06/2024  PCP: Sherial Bail, MD  REFERRING PROVIDER: Sherial Bail, MD    PT End of Session - 04/06/24 1322     Visit Number 133    Number of Visits 150    Date for Recertification  05/20/24    Progress Note Due on Visit 140    PT Start Time 1315    PT Stop Time 1358    PT Time Calculation (min) 43 min    Equipment Utilized During Treatment Gait belt    Activity Tolerance Patient tolerated treatment well    Behavior During Therapy WFL for tasks assessed/performed             Past Medical History:  Diagnosis Date   Asthma    GERD (gastroesophageal reflux disease)    Hyperlipidemia    Hypertension    Obesity    Stroke [facility])    Past Surgical History:  Procedure Laterality Date   BRAIN SURGERY     Patient Active Problem List   Diagnosis Date Noted   Impaired glucose tolerance 03/23/2020   Learning disability 03/23/2020   Urinary and fecal incontinence 06/14/2019   Chronic pain syndrome 03/23/2019   Constipation 03/23/2019   Dysarthria 03/23/2019   Dysphagia 03/23/2019   Learning difficulty 03/23/2019   Morbid obesity (HCC) 03/23/2019   Muscle weakness 03/23/2019   Vitamin D deficiency 03/23/2019   Bilateral carotid artery stenosis 12/15/2018   Moderate aortic valve stenosis 12/15/2018   BMI 45.0-49.9, adult (HCC) 11/10/2018   Cerebrovascular accident (HCC) 05/02/2016   Hemiparesis affecting right side as late effect of cerebrovascular accident (HCC) 05/01/2016   Brain tumor (HCC) 11/24/2014   Gastro-esophageal reflux disease without esophagitis 11/24/2014   Accident due to mechanical fall without injury 08/03/2014   Essential hypertension 06/13/2014   Mixed hyperlipidemia 06/13/2014   Unspecified sequelae of cerebral infarction 06/13/2014   Thyromegaly 01/07/2014   Type 2 diabetes mellitus without complication  01/06/2014   Neck mass 09/08/2013   Swimmer's ear 09/08/2013   Erectile dysfunction 01/03/2013   Prediabetes 01/03/2013   ONSET DATE: 05/09/2016  REFERRING DIAG: I69.351 (ICD-10-CM) - Hemiplegia and hemiparesis following cerebral infarction affecting right dominant side   THERAPY DIAG:  Unsteadiness on feet  Difficulty in walking, not elsewhere classified  Muscle weakness (generalized)  Abnormality of gait and mobility  Other abnormalities of gait and mobility  Repeated falls  Hemiplegia and hemiparesis following cerebral infarction affecting right dominant side (HCC)  Left-sided weakness   Rationale for Evaluation and Treatment Rehabilitation  SUBJECTIVE:  SUBJECTIVE STATEMENT  Patient reports had a good Christmas and received some nice new shoes.     Pt accompanied by: family member   PERTINENT HISTORY:  The pt is a pleasant 58 yo male who returns to PT for evaluation of deficits s/o old CVA. Pt with hx of CVA Feb 2018. He was previously fully independent, but CVA caused significant deficits, mainly affecting his right side. Pt reports continued difficulty with walking and transfers. Pt still using WC for majority of mobility, otherwise he uses his RW. Pt lives with his mom who is his main caregiver. Patient had outpatient PT previously in clinic in 2023. He reports since discharge he has been completing his HEP and walking about every day. Pt would like to improve his walking mainly, but also would like to work on balance. He also reports difficulty with transfers. Pt's mom and his caregiver helps him with ADLs. However, he says he is now waiting for a new caregiver. Prior to this pt had caregiver 3x/week. PMH significant for hx of brain tumor, past CVA, DMII and HTN. See chart for  additional information.  PAIN:  Are you having pain? Pt reports no pain currently  PRECAUTIONS: fall  WEIGHT BEARING RESTRICTIONS none  PLOF: required assistance with ADLs (old CVA), prior to CVA in 2018 was indep.  History of Falls: Pt reports no recent falls  Home information: Pt lives with mother who is his primary caregiver. Previously had home aide, 3x/week but reports currently waiting for new one.  PATIENT GOALS: Pt would like to improve his ability to ambulate.  OBJECTIVE:  taken at [facility] unless otherwise noted:   DIAGNOSTIC FINDINGS: No recent imaging per chart  Objective measures taken at eval unless otherwise specified:   Posture: forward head posture, rounded shoulders  MMT LE: grossly  4+/5, exception hip flexors and abductors 4/5 each bilat   Functional tests:  5xSTS:  34 sec, use of BUE (multiple initial attempts with posterior LOB in chair)   : 0.096 m/s with RW, close CGA, and WC follow  Gait for distance (goal 150 ft): deferred due to time  Gait mechanics: impaired, decreased speed (see ), crouched posture, poor postural stability and relies heavily on BUE support on RW. Pt now with bilat AFOS   FOTO: 44 (goal 50)   TODAY'S TREATMENT : 04/06/24   TA:  Static stand at support bar x 3 min then 1 min of lateral weight shifting followed by chest press/pull x 1 min then 1 more min of standing w/o UE support  Sit to stand with 1 UE on railing of // bar and 1 on armrest- x 5 reps with VC only to lean forward      GAIT:   Fwd walk using BRW- x 75 feet x 2 rounds focusing on erect posture and more continuous motion of walker Mild retro LOB requiring close CGA   Fwd/Retro walking x 15 feet x 2 trials using BRW-  focusing on increased step length. Patient required approx 5x longer today to walk backward and close CGA for safety- some difficulty with negotiating walker bwd but no actual physical assist.    PATIENT EDUCATION: Education details:  If pt can lean pelvis into support surface, his energy efficicency will be higher.  Person educated: Patient Education method: Explanation, Demonstration, Tactile cues, and Verbal cues Education comprehension: verbalized understanding, returned demonstration, and verbal cues required  HOME EXERCISE PROGRAM:  Standing hip flexor stretch at counter, prolonged standing with erect posture  for gluteal activation, HS curls with RTB  Access Code: [email or web address] URL: [email or web address] Date: 05/15/2023 Prepared by: Connell Kiss  Exercises - Seated Long Arc Quad with Ankle Weight  - 1 x daily - 7 x weekly - 2 sets - 10 reps  GOALS: Goals reviewed with patient? No  SHORT TERM GOALS: Target date: 11/12/2022  Pt will be independent with HEP in order to improve strength and balance in order to decrease fall risk and improve function at home. Baseline: instructed in HEP today, to provide printout next visit Goal status: MET  LONG TERM GOALS: Target date: 02/24/2024  Pt will increase FOTO to at least 50 in order to demonstrate improvement in function related to mobility and QoL. Baseline: 44 7/29:48 9/10:40 12/24/2022= 51 Goal status: MET  2.  Pt (< 60 yrs old) will complete 5xSTS test < 30 seconds indicating an increase in LE strength and improved balance. Baseline: 10/01/22 34 sec (multiple initial attempts with posterior LOB in chair) 12/20: 49 seconds 11/04/22:1:06, min A on multiple attempts to maintain balance and pt stays in flexed posture throughout test when coming to standing.   9/10: 27.5 sec, Pushes WC post consistently, close CGA  10/22: 47 seconds first attempt 12/5: 1 minute, 37 seconds, multiple attempts to get first stand, but no difficulty after that; 03/20/2023= 1 min 2 sec with 1UE on walker and 1 on armrest- some posterior lean- Close CGA; 06/17/2023= 38 sec with 1 UE on walker and 1 on armrest; 08/21/2023= 35 sec with 1UE on walker and 1 on armrest.  8/5: 36 sec with  1 UE on walker 1 on arm rest; 12/23/2023= 37.05 sec with some posterior LOB- CGA to min A to correct.  01/29/24: 54.77 sec with significant posterior LOB and needing modA to come upright into standing 02/26/24: 46 sec with some posterior LOB on 2 out of 5 stands- min A to recover and stand 03/18/2024= 36 sec from green chair rather than seat of w/c with 1 arm on armrest and the other on walker-  Goal status: ONGOING  3.  Pt will increase to > 0.5 m/s (30 sec) as to improve gait speed for better home ambulation and reduce fall risk. Baseline: 10/01/22 0.096 m/s with RW, close CGA, and WC follow previously 12/27: 0.11 m/s 11/04/22: .056 m/s  9/10: 076 m/s 10/22: 0.64 m/s  11/26: .076 m/s; 03/20/2023= 0.08 m/s; 04/22/2023= 0.089 m/s; 05/15/2023 = 0.079 m/s; 05/27/2023= 0.11 m/s; 07/17/2023= 0.15 m/s; 08/21/2023= 0.14 m/s avg with Bariatric walker; 09/09/2023= 0.14 m/s; 10/02/2023= 0.09 m/s with Bariatric walker and Increased cues to swing RLE through vs. Previous sesisons. 11/13/2023= 0.12 m/s with Bariatric walker; 12/11/2023= 0.14 m/s; 12/23/2023=0.135 m/s 01/29/24: 3 min 25 sec, 0.05 m/s 02/26/2024: 0.14 m/s avg with BRW  Goal status: NOT PROGRESSING  4.  Pt will increase hip flexor and abductor  gross strength to 4+/5 bilat as to improve functional strength for independent gait, increased standing tolerance, and increased ADL ability. Baseline: 10/01/22 4+/5, exception hip flexors and abductors 4/5 each bilat  (previously 03/27/22: 4+/5 ) 9/10: Hip flexor 4/5, hip abductor 4+/5 10/22: hip flexor 4/5 hip abductor 4+/5 11/26: hip flexor 4+/5 hip abductor 4+/5 Goal status: MET  5.  Pt will ambulate 150 ft with BRW and WC follow with no rest breaks in less than 12 min to allow for improved mobility within home and increased independence. Baseline: 10/03/22 150' with BRW 04/03/22: 105 ft with BRW 7/29: 100 in 9:30 with pediatric RW  9/10:101 ft with BRW stooped posture worsened throughout test 12/24/2022- 150  feet using BRW, CGA with w/c follow- in 10 min 17 sec. Will keep goal to make sure he can do this consistently 10/22: 119 ft with BRW 8 min 23 seconds terminated by patient  11/26: 148 ft with BRW 14 min 34 seconds terminated by patient 03/20/2023= 152 feet using BRW, CGA and w/c follow in 14 min 3 sec- Need to keep goal active to ensure consistency but will revised and add a time element to goal. 04/22/2023= 162 feet in 11 min 54 sec using bariatric RW, CGA and close CGA assist; 05/27/2023- 170 feet without rest break using bariatric RW. 06/17/2023= 160 feet with bariatric 4WW in 9 min- stopped due to fatigue.  Goal status: MET   6. Pt will decrease TUG to below 1 min 30 seconds/decrease in order to demonstrate decreased fall risk. Baseline: 06/17/2023= 2 min using bariatric 4WW; 09/09/2023= 1 min 50 sec with bariatric front wheeled walker.  12/02/2023= 1 min 44 sec;  12/23/2023= 2 min 8 sec avg with BRW 01/29/24: 3 min 42 sec with BRW 02/26/24: 1 min 58 sec with BRW and max VC for turning. 03/18/24: 1 min 45 sec with BRW and mod VC for turning Goal status: PROGRESSING   7. Pt will ambulate 300 ft with BRW, CGA and WC follow with no rest breaks in less than 20 min to allow for improved mobility within home/community and increased independence. Baseline: 06/17/2023= 160 feet with bariatric 4WW in 9 min- stopped due to fatigue. 07/17/2023- Patient able to walk 270 feet in > 20 min with BRW, CGA and close w/c follow today- VC for posture and for gait sequencing; 08/21/2023- Not specifically testing but patient has been walking 160 feet or more each Visit; 09/09/2023= Patient ambulated 180 feet overall at one time today using bariatric RW, CGA with VC for erect posture and for increased step length.  10/02/2023= 165 feet with Bariatric RW- close w/c follow and CGA. More diffiuclty with gait sequencing more consistent VC for erect posture.  12/02/2023- Paitent ambulated >100 feet in // bars today but will reassess  using walker next visit.  12/11/2023= Patient ambulated 204 feet with BRW and Close CGA and w/c follow 01/29/24: Patient ambulated 111 feet with BRW and Close CGA and w/c follow (10 min 59 sec) before needing to rest 02/24/2024= Patient ambulated 180 feet with BRW and Close CGA.  03/16/2024= 188 feet with BRW and close CGA Goal Status: PROGRESSING  8. Patient will report ability to walk out his front door including stepping over threshold using his walker with min A of caregiver to enable him to be more independent with household mobility and be able to go out to porch with less dependence on caregivers. Baseline: Patient dependent on caregivers to push him in his wheelchair to go out to porch and reports dependent on them to always assist so he doesn't go outside like he would like to. 01/29/24: Pt reports he has not been able to attempt this due to not having a male at home to assist him. 02/24/2024: Patient reports has been able to step outside on his porch a couple of time with use of walker and close caregiver support.  Goal status: MET  9. Patient will stand at kitchen counter or support bar for > 5 min without UE support to demonstrate improved standing posture/endurance/LE strength with standing ADLs to assist with toileting, changing clothes, or light kitchen activities.  Baseline:  Patient able to stand approx 2.5 min without posterior lean and no UE support.  Goal status: NEW  ASSESSMENT :  CLINICAL IMPRESSION:   Patient struggled more with retro walking outside of // bars using his walker with decreased confidence including decreased step length requiring a significant amount of time to complete minimal distance. He seemed slower overall today but well motivated denying any issues or pain. Pt will continue to benefit from skilled therapy to address remaining deficits in order to improve overall QoL and return to PLOF.       OBJECTIVE IMPAIRMENTS Abnormal gait, decreased activity  tolerance, decreased balance, decreased coordination, decreased endurance, decreased mobility, difficulty walking, decreased ROM, decreased strength, improper body mechanics, postural dysfunction, and obesity.   ACTIVITY LIMITATIONS carrying, lifting, bending, sitting, standing, squatting, stairs, transfers, bed mobility, continence, bathing, toileting, dressing, hygiene/grooming, and locomotion level  PARTICIPATION LIMITATIONS: meal prep, cleaning, laundry, interpersonal relationship, driving, shopping, community activity, and yard work  PERSONAL FACTORS Fitness, Past/current experiences, Time since onset of injury/illness/exacerbation, and 3+ comorbidities: hx of brain tumor, past CVA, HTN, obesity, learning disability, DMII are also affecting patient's functional outcome.   REHAB POTENTIAL: Fair    CLINICAL DECISION MAKING: Stable/uncomplicated  EVALUATION COMPLEXITY: Low  PLAN: PT FREQUENCY: 1-2x/week  PT DURATION: 12 weeks  PLANNED INTERVENTIONS: Therapeutic exercises, Therapeutic activity, Neuromuscular re-education, Balance training, Gait training, Patient/Family education, Joint mobilization, Stair training, Vestibular training, Orthotic/Fit training, DME instructions, Dry Needling, Electrical stimulation, Wheelchair mobility training, Cryotherapy, Moist heat, Compression bandaging, Manual therapy, and Re-evaluation  PLAN FOR NEXT SESSION:  - gait with 180 deg turn -Retro walking -Gait training with BWS system as appropriate.  --Continue with functional strengthening exercises of the LE's  -Gait activities using RW. Incorporate proper technique with all transfers to decrease his risk of falling.   Reyes LOISE London PT  Physical Therapist- [place]  [facility]   2:40 PM 04/06/2024           "

## 2024-04-13 ENCOUNTER — Ambulatory Visit: Attending: Internal Medicine

## 2024-04-13 DIAGNOSIS — M6281 Muscle weakness (generalized): Secondary | ICD-10-CM | POA: Diagnosis present

## 2024-04-13 DIAGNOSIS — R2689 Other abnormalities of gait and mobility: Secondary | ICD-10-CM | POA: Insufficient documentation

## 2024-04-13 DIAGNOSIS — R2681 Unsteadiness on feet: Secondary | ICD-10-CM | POA: Diagnosis present

## 2024-04-13 DIAGNOSIS — R262 Difficulty in walking, not elsewhere classified: Secondary | ICD-10-CM | POA: Insufficient documentation

## 2024-04-13 DIAGNOSIS — R278 Other lack of coordination: Secondary | ICD-10-CM | POA: Diagnosis present

## 2024-04-13 DIAGNOSIS — R531 Weakness: Secondary | ICD-10-CM | POA: Insufficient documentation

## 2024-04-13 DIAGNOSIS — R269 Unspecified abnormalities of gait and mobility: Secondary | ICD-10-CM | POA: Diagnosis present

## 2024-04-13 DIAGNOSIS — R296 Repeated falls: Secondary | ICD-10-CM | POA: Insufficient documentation

## 2024-04-13 DIAGNOSIS — I69351 Hemiplegia and hemiparesis following cerebral infarction affecting right dominant side: Secondary | ICD-10-CM | POA: Insufficient documentation

## 2024-04-13 NOTE — Therapy (Signed)
 "  OUTPATIENT PHYSICAL THERAPY TREATMENT   Patient Name: Curtis Powell MRN: [id number] DOB:1965-10-08, 59 y.o., male Today's Date: 04/13/2024  PCP: Sherial Bail, MD  REFERRING PROVIDER: Sherial Bail, MD    PT End of Session - 04/13/24 1321     Visit Number 134    Number of Visits 150    Date for Recertification  05/20/24    Progress Note Due on Visit 140    PT Start Time 1318    PT Stop Time 1359    PT Time Calculation (min) 41 min    Equipment Utilized During Treatment Gait belt    Activity Tolerance Patient tolerated treatment well    Behavior During Therapy WFL for tasks assessed/performed              Past Medical History:  Diagnosis Date   Asthma    GERD (gastroesophageal reflux disease)    Hyperlipidemia    Hypertension    Obesity    Stroke [facility])    Past Surgical History:  Procedure Laterality Date   BRAIN SURGERY     Patient Active Problem List   Diagnosis Date Noted   Impaired glucose tolerance 03/23/2020   Learning disability 03/23/2020   Urinary and fecal incontinence 06/14/2019   Chronic pain syndrome 03/23/2019   Constipation 03/23/2019   Dysarthria 03/23/2019   Dysphagia 03/23/2019   Learning difficulty 03/23/2019   Morbid obesity (HCC) 03/23/2019   Muscle weakness 03/23/2019   Vitamin D deficiency 03/23/2019   Bilateral carotid artery stenosis 12/15/2018   Moderate aortic valve stenosis 12/15/2018   BMI 45.0-49.9, adult (HCC) 11/10/2018   Cerebrovascular accident (HCC) 05/02/2016   Hemiparesis affecting right side as late effect of cerebrovascular accident (HCC) 05/01/2016   Brain tumor (HCC) 11/24/2014   Gastro-esophageal reflux disease without esophagitis 11/24/2014   Accident due to mechanical fall without injury 08/03/2014   Essential hypertension 06/13/2014   Mixed hyperlipidemia 06/13/2014   Unspecified sequelae of cerebral infarction 06/13/2014   Thyromegaly 01/07/2014   Type 2 diabetes mellitus without complication  01/06/2014   Neck mass 09/08/2013   Swimmer's ear 09/08/2013   Erectile dysfunction 01/03/2013   Prediabetes 01/03/2013   ONSET DATE: 05/09/2016  REFERRING DIAG: I69.351 (ICD-10-CM) - Hemiplegia and hemiparesis following cerebral infarction affecting right dominant side   THERAPY DIAG:  Unsteadiness on feet  Difficulty in walking, not elsewhere classified  Muscle weakness (generalized)  Abnormality of gait and mobility  Other abnormalities of gait and mobility  Repeated falls  Hemiplegia and hemiparesis following cerebral infarction affecting right dominant side (HCC)  Left-sided weakness  Other lack of coordination   Rationale for Evaluation and Treatment Rehabilitation  SUBJECTIVE:  SUBJECTIVE STATEMENT  Patient reports doing well- states had a good weekend   Pt accompanied by: family member   PERTINENT HISTORY:  The pt is a pleasant 59 yo male who returns to PT for evaluation of deficits s/o old CVA. Pt with hx of CVA Feb 2018. He was previously fully independent, but CVA caused significant deficits, mainly affecting his right side. Pt reports continued difficulty with walking and transfers. Pt still using WC for majority of mobility, otherwise he uses his RW. Pt lives with his mom who is his main caregiver. Patient had outpatient PT previously in clinic in 2023. He reports since discharge he has been completing his HEP and walking about every day. Pt would like to improve his walking mainly, but also would like to work on balance. He also reports difficulty with transfers. Pt's mom and his caregiver helps him with ADLs. However, he says he is now waiting for a new caregiver. Prior to this pt had caregiver 3x/week. PMH significant for hx of brain tumor, past CVA, DMII and HTN. See chart  for additional information.  PAIN:  Are you having pain? Pt reports no pain currently  PRECAUTIONS: fall  WEIGHT BEARING RESTRICTIONS none  PLOF: required assistance with ADLs (old CVA), prior to CVA in 2018 was indep.  History of Falls: Pt reports no recent falls  Home information: Pt lives with mother who is his primary caregiver. Previously had home aide, 3x/week but reports currently waiting for new one.  PATIENT GOALS: Pt would like to improve his ability to ambulate.  OBJECTIVE:  taken at [facility] unless otherwise noted:   DIAGNOSTIC FINDINGS: No recent imaging per chart  Objective measures taken at eval unless otherwise specified:   Posture: forward head posture, rounded shoulders  MMT LE: grossly  4+/5, exception hip flexors and abductors 4/5 each bilat   Functional tests:  5xSTS:  34 sec, use of BUE (multiple initial attempts with posterior LOB in chair)   : 0.096 m/s with RW, close CGA, and WC follow  Gait for distance (goal 150 ft): deferred due to time  Gait mechanics: impaired, decreased speed (see ), crouched posture, poor postural stability and relies heavily on BUE support on RW. Pt now with bilat AFOS   FOTO: 44 (goal 50)   TODAY'S TREATMENT : 04/13/2024  TA:  Static stand at support bar x 3 min then 1 min of lateral weight shifting followed by chest press/pull x 1 min then 1 more min of standing w/o UE support   Sit to stand with 1 UE on railing of // bar and 1 on armrest- x 5 reps with VC only to lean forward   Dynamic lateral step (at support bar) and reach for dry erase board x 20 reps alt Sides- Patient able to perform- VC for step technique- Reports as very fatiguing.   Fwd step with each LE then immediately bwd steps with each LE x 20 reps- Patient with more difficulty with hip ext (retro steps)   Support bar standing push ups- x 15 reps - VC to lean toward bar (chest as close to bar as possible)   Static stand with high knees 2 x 15  reps with BUE Support   Last rd of static stand with alt UE raises with VC not to lose his erect posture x 15 reps each Side.       GAIT:   Fwd walk using BRW- x 75 feet x 2 rounds focusing on erect posture and  more continuous motion of walker Mild retro LOB requiring close CGA   Fwd/Retro walking x 15 feet x 2 trials using BRW-  focusing on increased step length. Patient required approx 5x longer today to walk backward and close CGA for safety- some difficulty with negotiating walker bwd but no actual physical assist.    PATIENT EDUCATION: Education details: If pt can lean pelvis into support surface, his energy efficicency will be higher.  Person educated: Patient Education method: Explanation, Demonstration, Tactile cues, and Verbal cues Education comprehension: verbalized understanding, returned demonstration, and verbal cues required  HOME EXERCISE PROGRAM:  Standing hip flexor stretch at counter, prolonged standing with erect posture for gluteal activation, HS curls with RTB  Access Code: [email or web address] URL: [email or web address] Date: 05/15/2023 Prepared by: Connell Kiss  Exercises - Seated Long Arc Quad with Ankle Weight  - 1 x daily - 7 x weekly - 2 sets - 10 reps  GOALS: Goals reviewed with patient? No  SHORT TERM GOALS: Target date: 11/12/2022  Pt will be independent with HEP in order to improve strength and balance in order to decrease fall risk and improve function at home. Baseline: instructed in HEP today, to provide printout next visit Goal status: MET  LONG TERM GOALS: Target date: 02/24/2024  Pt will increase FOTO to at least 50 in order to demonstrate improvement in function related to mobility and QoL. Baseline: 44 7/29:48 9/10:40 12/24/2022= 51 Goal status: MET  2.  Pt (< 60 yrs old) will complete 5xSTS test < 30 seconds indicating an increase in LE strength and improved balance. Baseline: 10/01/22 34 sec (multiple initial attempts with  posterior LOB in chair) 12/20: 49 seconds 11/04/22:1:06, min A on multiple attempts to maintain balance and pt stays in flexed posture throughout test when coming to standing.   9/10: 27.5 sec, Pushes WC post consistently, close CGA  10/22: 47 seconds first attempt 12/5: 1 minute, 37 seconds, multiple attempts to get first stand, but no difficulty after that; 03/20/2023= 1 min 2 sec with 1UE on walker and 1 on armrest- some posterior lean- Close CGA; 06/17/2023= 38 sec with 1 UE on walker and 1 on armrest; 08/21/2023= 35 sec with 1UE on walker and 1 on armrest.  8/5: 36 sec with 1 UE on walker 1 on arm rest; 12/23/2023= 37.05 sec with some posterior LOB- CGA to min A to correct.  01/29/24: 54.77 sec with significant posterior LOB and needing modA to come upright into standing 02/26/24: 46 sec with some posterior LOB on 2 out of 5 stands- min A to recover and stand 03/18/2024= 36 sec from green chair rather than seat of w/c with 1 arm on armrest and the other on walker-  Goal status: ONGOING  3.  Pt will increase to > 0.5 m/s (30 sec) as to improve gait speed for better home ambulation and reduce fall risk. Baseline: 10/01/22 0.096 m/s with RW, close CGA, and WC follow previously 12/27: 0.11 m/s 11/04/22: .056 m/s  9/10: 076 m/s 10/22: 0.64 m/s  11/26: .076 m/s; 03/20/2023= 0.08 m/s; 04/22/2023= 0.089 m/s; 05/15/2023 = 0.079 m/s; 05/27/2023= 0.11 m/s; 07/17/2023= 0.15 m/s; 08/21/2023= 0.14 m/s avg with Bariatric walker; 09/09/2023= 0.14 m/s; 10/02/2023= 0.09 m/s with Bariatric walker and Increased cues to swing RLE through vs. Previous sesisons. 11/13/2023= 0.12 m/s with Bariatric walker; 12/11/2023= 0.14 m/s; 12/23/2023=0.135 m/s 01/29/24: 3 min 25 sec, 0.05 m/s 02/26/2024: 0.14 m/s avg with BRW  Goal status: NOT PROGRESSING  4.  Pt will increase  hip flexor and abductor  gross strength to 4+/5 bilat as to improve functional strength for independent gait, increased standing tolerance, and increased ADL  ability. Baseline: 10/01/22 4+/5, exception hip flexors and abductors 4/5 each bilat  (previously 03/27/22: 4+/5 ) 9/10: Hip flexor 4/5, hip abductor 4+/5 10/22: hip flexor 4/5 hip abductor 4+/5 11/26: hip flexor 4+/5 hip abductor 4+/5 Goal status: MET  5.  Pt will ambulate 150 ft with BRW and WC follow with no rest breaks in less than 12 min to allow for improved mobility within home and increased independence. Baseline: 10/03/22 150' with BRW 04/03/22: 105 ft with BRW 7/29: 100 in 9:30 with pediatric RW 9/10:101 ft with BRW stooped posture worsened throughout test 12/24/2022- 150 feet using BRW, CGA with w/c follow- in 10 min 17 sec. Will keep goal to make sure he can do this consistently 10/22: 119 ft with BRW 8 min 23 seconds terminated by patient  11/26: 148 ft with BRW 14 min 34 seconds terminated by patient 03/20/2023= 152 feet using BRW, CGA and w/c follow in 14 min 3 sec- Need to keep goal active to ensure consistency but will revised and add a time element to goal. 04/22/2023= 162 feet in 11 min 54 sec using bariatric RW, CGA and close CGA assist; 05/27/2023- 170 feet without rest break using bariatric RW. 06/17/2023= 160 feet with bariatric 4WW in 9 min- stopped due to fatigue.  Goal status: MET   6. Pt will decrease TUG to below 1 min 30 seconds/decrease in order to demonstrate decreased fall risk. Baseline: 06/17/2023= 2 min using bariatric 4WW; 09/09/2023= 1 min 50 sec with bariatric front wheeled walker.  12/02/2023= 1 min 44 sec;  12/23/2023= 2 min 8 sec avg with BRW 01/29/24: 3 min 42 sec with BRW 02/26/24: 1 min 58 sec with BRW and max VC for turning. 03/18/24: 1 min 45 sec with BRW and mod VC for turning Goal status: PROGRESSING   7. Pt will ambulate 300 ft with BRW, CGA and WC follow with no rest breaks in less than 20 min to allow for improved mobility within home/community and increased independence. Baseline: 06/17/2023= 160 feet with bariatric 4WW in 9 min- stopped due to  fatigue. 07/17/2023- Patient able to walk 270 feet in > 20 min with BRW, CGA and close w/c follow today- VC for posture and for gait sequencing; 08/21/2023- Not specifically testing but patient has been walking 160 feet or more each Visit; 09/09/2023= Patient ambulated 180 feet overall at one time today using bariatric RW, CGA with VC for erect posture and for increased step length.  10/02/2023= 165 feet with Bariatric RW- close w/c follow and CGA. More diffiuclty with gait sequencing more consistent VC for erect posture.  12/02/2023- Paitent ambulated >100 feet in // bars today but will reassess using walker next visit.  12/11/2023= Patient ambulated 204 feet with BRW and Close CGA and w/c follow 01/29/24: Patient ambulated 111 feet with BRW and Close CGA and w/c follow (10 min 59 sec) before needing to rest 02/24/2024= Patient ambulated 180 feet with BRW and Close CGA.  03/16/2024= 188 feet with BRW and close CGA Goal Status: PROGRESSING  8. Patient will report ability to walk out his front door including stepping over threshold using his walker with min A of caregiver to enable him to be more independent with household mobility and be able to go out to porch with less dependence on caregivers. Baseline: Patient dependent on caregivers to push him in  his wheelchair to go out to porch and reports dependent on them to always assist so he doesn't go outside like he would like to. 01/29/24: Pt reports he has not been able to attempt this due to not having a male at home to assist him. 02/24/2024: Patient reports has been able to step outside on his porch a couple of time with use of walker and close caregiver support.  Goal status: MET  9. Patient will stand at kitchen counter or support bar for > 5 min without UE support to demonstrate improved standing posture/endurance/LE strength with standing ADLs to assist with toileting, changing clothes, or light kitchen activities.  Baseline: Patient able to stand  approx 2.5 min without posterior lean and no UE support.  Goal status: NEW  ASSESSMENT :  CLINICAL IMPRESSION:   Treatment focused on standing upright - weight shifting more forward to prevent posterior LOB while either standing static or performing other activities. Patient with initial difficulty but vastly improved today with increased practice spending majority of session in standing position with good overall weight bearing through his BLEs.  Pt will continue to benefit from skilled therapy to address remaining deficits in order to improve overall QoL and return to PLOF.       OBJECTIVE IMPAIRMENTS Abnormal gait, decreased activity tolerance, decreased balance, decreased coordination, decreased endurance, decreased mobility, difficulty walking, decreased ROM, decreased strength, improper body mechanics, postural dysfunction, and obesity.   ACTIVITY LIMITATIONS carrying, lifting, bending, sitting, standing, squatting, stairs, transfers, bed mobility, continence, bathing, toileting, dressing, hygiene/grooming, and locomotion level  PARTICIPATION LIMITATIONS: meal prep, cleaning, laundry, interpersonal relationship, driving, shopping, community activity, and yard work  PERSONAL FACTORS Fitness, Past/current experiences, Time since onset of injury/illness/exacerbation, and 3+ comorbidities: hx of brain tumor, past CVA, HTN, obesity, learning disability, DMII are also affecting patient's functional outcome.   REHAB POTENTIAL: Fair    CLINICAL DECISION MAKING: Stable/uncomplicated  EVALUATION COMPLEXITY: Low  PLAN: PT FREQUENCY: 1-2x/week  PT DURATION: 12 weeks  PLANNED INTERVENTIONS: Therapeutic exercises, Therapeutic activity, Neuromuscular re-education, Balance training, Gait training, Patient/Family education, Joint mobilization, Stair training, Vestibular training, Orthotic/Fit training, DME instructions, Dry Needling, Electrical stimulation, Wheelchair mobility training,  Cryotherapy, Moist heat, Compression bandaging, Manual therapy, and Re-evaluation  PLAN FOR NEXT SESSION:  - gait with 180 deg turn -Retro walking -Gait training with BWS system as appropriate.  --Continue with functional strengthening exercises of the LE's  -Gait activities using RW. Incorporate proper technique with all transfers to decrease his risk of falling.   Reyes LOISE London PT  Physical Therapist- [facility]   3:18 PM 04/13/2024           "

## 2024-04-15 ENCOUNTER — Ambulatory Visit

## 2024-04-15 DIAGNOSIS — I69351 Hemiplegia and hemiparesis following cerebral infarction affecting right dominant side: Secondary | ICD-10-CM

## 2024-04-15 DIAGNOSIS — R2689 Other abnormalities of gait and mobility: Secondary | ICD-10-CM

## 2024-04-15 DIAGNOSIS — R262 Difficulty in walking, not elsewhere classified: Secondary | ICD-10-CM

## 2024-04-15 DIAGNOSIS — R278 Other lack of coordination: Secondary | ICD-10-CM

## 2024-04-15 DIAGNOSIS — R2681 Unsteadiness on feet: Secondary | ICD-10-CM | POA: Diagnosis not present

## 2024-04-15 DIAGNOSIS — R296 Repeated falls: Secondary | ICD-10-CM

## 2024-04-15 DIAGNOSIS — R531 Weakness: Secondary | ICD-10-CM

## 2024-04-15 DIAGNOSIS — R269 Unspecified abnormalities of gait and mobility: Secondary | ICD-10-CM

## 2024-04-15 DIAGNOSIS — M6281 Muscle weakness (generalized): Secondary | ICD-10-CM

## 2024-04-15 NOTE — Therapy (Signed)
 "  OUTPATIENT PHYSICAL THERAPY TREATMENT   Patient Name: DEAGAN SEVIN MRN: [id number] DOB:[birth year], 59 y.o., male Today's Date: 04/15/2024  PCP: Sherial Bail, MD  REFERRING PROVIDER: Sherial Bail, MD    PT End of Session - 04/15/24 1333     Visit Number 135    Number of Visits 150    Date for Recertification  05/20/24    Progress Note Due on Visit 140    PT Start Time 1316    PT Stop Time 1358    PT Time Calculation (min) 42 min    Equipment Utilized During Treatment Gait belt    Activity Tolerance Patient tolerated treatment well    Behavior During Therapy WFL for tasks assessed/performed              Past Medical History:  Diagnosis Date   Asthma    GERD (gastroesophageal reflux disease)    Hyperlipidemia    Hypertension    Obesity    Stroke [facility])    Past Surgical History:  Procedure Laterality Date   BRAIN SURGERY     Patient Active Problem List   Diagnosis Date Noted   Impaired glucose tolerance 03/23/2020   Learning disability 03/23/2020   Urinary and fecal incontinence 06/14/2019   Chronic pain syndrome 03/23/2019   Constipation 03/23/2019   Dysarthria 03/23/2019   Dysphagia 03/23/2019   Learning difficulty 03/23/2019   Morbid obesity (HCC) 03/23/2019   Muscle weakness 03/23/2019   Vitamin D deficiency 03/23/2019   Bilateral carotid artery stenosis 12/15/2018   Moderate aortic valve stenosis 12/15/2018   BMI 45.0-49.9, adult (HCC) 11/10/2018   Cerebrovascular accident (HCC) 05/02/2016   Hemiparesis affecting right side as late effect of cerebrovascular accident (HCC) 05/01/2016   Brain tumor (HCC) 11/24/2014   Gastro-esophageal reflux disease without esophagitis 11/24/2014   Accident due to mechanical fall without injury 08/03/2014   Essential hypertension 06/13/2014   Mixed hyperlipidemia 06/13/2014   Unspecified sequelae of cerebral infarction 06/13/2014   Thyromegaly 01/07/2014   Type 2 diabetes mellitus without complication  01/06/2014   Neck mass 09/08/2013   Swimmer's ear 09/08/2013   Erectile dysfunction 01/03/2013   Prediabetes 01/03/2013   ONSET DATE: 05/09/2016  REFERRING DIAG: I69.351 (ICD-10-CM) - Hemiplegia and hemiparesis following cerebral infarction affecting right dominant side   THERAPY DIAG:  Unsteadiness on feet  Difficulty in walking, not elsewhere classified  Muscle weakness (generalized)  Abnormality of gait and mobility  Other abnormalities of gait and mobility  Repeated falls  Hemiplegia and hemiparesis following cerebral infarction affecting right dominant side (HCC)  Left-sided weakness  Other lack of coordination   Rationale for Evaluation and Treatment Rehabilitation  SUBJECTIVE:  SUBJECTIVE STATEMENT  Patient reports no new issues. States you worked me hard last time and I was tired.  Pt accompanied by: family member   PERTINENT HISTORY:  The pt is a pleasant 59 yo male who returns to PT for evaluation of deficits s/o old CVA. Pt with hx of CVA Feb 2018. He was previously fully independent, but CVA caused significant deficits, mainly affecting his right side. Pt reports continued difficulty with walking and transfers. Pt still using WC for majority of mobility, otherwise he uses his RW. Pt lives with his mom who is his main caregiver. Patient had outpatient PT previously in clinic in 2023. He reports since discharge he has been completing his HEP and walking about every day. Pt would like to improve his walking mainly, but also would like to work on balance. He also reports difficulty with transfers. Pt's mom and his caregiver helps him with ADLs. However, he says he is now waiting for a new caregiver. Prior to this pt had caregiver 3x/week. PMH significant for hx of brain tumor, past  CVA, DMII and HTN. See chart for additional information.  PAIN:  Are you having pain? Pt reports no pain currently  PRECAUTIONS: fall  WEIGHT BEARING RESTRICTIONS none  PLOF: required assistance with ADLs (old CVA), prior to CVA in 2018 was indep.  History of Falls: Pt reports no recent falls  Home information: Pt lives with mother who is his primary caregiver. Previously had home aide, 3x/week but reports currently waiting for new one.  PATIENT GOALS: Pt would like to improve his ability to ambulate.  OBJECTIVE:  taken at [facility] unless otherwise noted:   DIAGNOSTIC FINDINGS: No recent imaging per chart  Objective measures taken at eval unless otherwise specified:   Posture: forward head posture, rounded shoulders  MMT LE: grossly  4+/5, exception hip flexors and abductors 4/5 each bilat   Functional tests:  5xSTS:  34 sec, use of BUE (multiple initial attempts with posterior LOB in chair)   : 0.096 m/s with RW, close CGA, and WC follow  Gait for distance (goal 150 ft): deferred due to time  Gait mechanics: impaired, decreased speed (see ), crouched posture, poor postural stability and relies heavily on BUE support on RW. Pt now with bilat AFOS   FOTO: 44 (goal 50)   TODAY'S TREATMENT : 04/15/2024  TA:  Dynamic walking in // bars- length of bars forward then retro steps bwd x 3 (VC to drive his knees up on 2nd and 3rd trials with good results)   Static stand in // bars - looking in mirrow without UE x 3 min then 1 min of lateral weight shifting x 2 trials   Sit to stand with 1 UE on railing of // bar and 1 on armrest- x 12 reps with VC only to lean forward   Dynamic lateral side stepping in // bars - down and back x 3 x 2 trials - VC for step technique- Reports as very fatiguing.      PATIENT EDUCATION: Education details: If pt can lean pelvis into support surface, his energy efficicency will be higher.  Person educated: Patient Education method:  Explanation, Demonstration, Tactile cues, and Verbal cues Education comprehension: verbalized understanding, returned demonstration, and verbal cues required  HOME EXERCISE PROGRAM:  Standing hip flexor stretch at counter, prolonged standing with erect posture for gluteal activation, HS curls with RTB  Access Code: [email or web address] URL: [email or web address] Date: 05/15/2023 Prepared by: Connell Kiss  Exercises - Seated  Long [employer] with Ankle Weight  - 1 x daily - 7 x weekly - 2 sets - 10 reps  GOALS: Goals reviewed with patient? No  SHORT TERM GOALS: Target date: 11/12/2022  Pt will be independent with HEP in order to improve strength and balance in order to decrease fall risk and improve function at home. Baseline: instructed in HEP today, to provide printout next visit Goal status: MET  LONG TERM GOALS: Target date: 02/24/2024  Pt will increase FOTO to at least 50 in order to demonstrate improvement in function related to mobility and QoL. Baseline: 44 7/29:48 9/10:40 12/24/2022= 51 Goal status: MET  2.  Pt (< 60 yrs old) will complete 5xSTS test < 30 seconds indicating an increase in LE strength and improved balance. Baseline: 10/01/22 34 sec (multiple initial attempts with posterior LOB in chair) 12/20: 49 seconds 11/04/22:1:06, min A on multiple attempts to maintain balance and pt stays in flexed posture throughout test when coming to standing.   9/10: 27.5 sec, Pushes WC post consistently, close CGA  10/22: 47 seconds first attempt 12/5: 1 minute, 37 seconds, multiple attempts to get first stand, but no difficulty after that; 03/20/2023= 1 min 2 sec with 1UE on walker and 1 on armrest- some posterior lean- Close CGA; 06/17/2023= 38 sec with 1 UE on walker and 1 on armrest; 08/21/2023= 35 sec with 1UE on walker and 1 on armrest.  8/5: 36 sec with 1 UE on walker 1 on arm rest; 12/23/2023= 37.05 sec with some posterior LOB- CGA to min A to correct.  01/29/24: 54.77 sec with  significant posterior LOB and needing modA to come upright into standing 02/26/24: 46 sec with some posterior LOB on 2 out of 5 stands- min A to recover and stand 03/18/2024= 36 sec from green chair rather than seat of w/c with 1 arm on armrest and the other on walker-  Goal status: ONGOING  3.  Pt will increase to > 0.5 m/s (30 sec) as to improve gait speed for better home ambulation and reduce fall risk. Baseline: 10/01/22 0.096 m/s with RW, close CGA, and WC follow previously 12/27: 0.11 m/s 11/04/22: .056 m/s  9/10: 076 m/s 10/22: 0.64 m/s  11/26: .076 m/s; 03/20/2023= 0.08 m/s; 04/22/2023= 0.089 m/s; 05/15/2023 = 0.079 m/s; 05/27/2023= 0.11 m/s; 07/17/2023= 0.15 m/s; 08/21/2023= 0.14 m/s avg with Bariatric walker; 09/09/2023= 0.14 m/s; 10/02/2023= 0.09 m/s with Bariatric walker and Increased cues to swing RLE through vs. Previous sesisons. 11/13/2023= 0.12 m/s with Bariatric walker; 12/11/2023= 0.14 m/s; 12/23/2023=0.135 m/s 01/29/24: 3 min 25 sec, 0.05 m/s 02/26/2024: 0.14 m/s avg with BRW  Goal status: NOT PROGRESSING  4.  Pt will increase hip flexor and abductor  gross strength to 4+/5 bilat as to improve functional strength for independent gait, increased standing tolerance, and increased ADL ability. Baseline: 10/01/22 4+/5, exception hip flexors and abductors 4/5 each bilat  (previously 03/27/22: 4+/5 ) 9/10: Hip flexor 4/5, hip abductor 4+/5 10/22: hip flexor 4/5 hip abductor 4+/5 11/26: hip flexor 4+/5 hip abductor 4+/5 Goal status: MET  5.  Pt will ambulate 150 ft with BRW and WC follow with no rest breaks in less than 12 min to allow for improved mobility within home and increased independence. Baseline: 10/03/22 150' with BRW 04/03/22: 105 ft with BRW 7/29: 100 in 9:30 with pediatric RW 9/10:101 ft with BRW stooped posture worsened throughout test 12/24/2022- 150 feet using BRW, CGA with w/c follow- in 10 min 17 sec.  Will keep goal to make sure he can do this consistently 10/22: 119 ft  with BRW 8 min 23 seconds terminated by patient  11/26: 148 ft with BRW 14 min 34 seconds terminated by patient 03/20/2023= 152 feet using BRW, CGA and w/c follow in 14 min 3 sec- Need to keep goal active to ensure consistency but will revised and add a time element to goal. 04/22/2023= 162 feet in 11 min 54 sec using bariatric RW, CGA and close CGA assist; 05/27/2023- 170 feet without rest break using bariatric RW. 06/17/2023= 160 feet with bariatric 4WW in 9 min- stopped due to fatigue.  Goal status: MET   6. Pt will decrease TUG to below 1 min 30 seconds/decrease in order to demonstrate decreased fall risk. Baseline: 06/17/2023= 2 min using bariatric 4WW; 09/09/2023= 1 min 50 sec with bariatric front wheeled walker.  12/02/2023= 1 min 44 sec;  12/23/2023= 2 min 8 sec avg with BRW 01/29/24: 3 min 42 sec with BRW 02/26/24: 1 min 58 sec with BRW and max VC for turning. 03/18/24: 1 min 45 sec with BRW and mod VC for turning Goal status: PROGRESSING   7. Pt will ambulate 300 ft with BRW, CGA and WC follow with no rest breaks in less than 20 min to allow for improved mobility within home/community and increased independence. Baseline: 06/17/2023= 160 feet with bariatric 4WW in 9 min- stopped due to fatigue. 07/17/2023- Patient able to walk 270 feet in > 20 min with BRW, CGA and close w/c follow today- VC for posture and for gait sequencing; 08/21/2023- Not specifically testing but patient has been walking 160 feet or more each Visit; 09/09/2023= Patient ambulated 180 feet overall at one time today using bariatric RW, CGA with VC for erect posture and for increased step length.  10/02/2023= 165 feet with Bariatric RW- close w/c follow and CGA. More diffiuclty with gait sequencing more consistent VC for erect posture.  12/02/2023- Paitent ambulated >100 feet in // bars today but will reassess using walker next visit.  12/11/2023= Patient ambulated 204 feet with BRW and Close CGA and w/c follow 01/29/24: Patient  ambulated 111 feet with BRW and Close CGA and w/c follow (10 min 59 sec) before needing to rest 02/24/2024= Patient ambulated 180 feet with BRW and Close CGA.  03/16/2024= 188 feet with BRW and close CGA Goal Status: PROGRESSING  8. Patient will report ability to walk out his front door including stepping over threshold using his walker with min A of caregiver to enable him to be more independent with household mobility and be able to go out to porch with less dependence on caregivers. Baseline: Patient dependent on caregivers to push him in his wheelchair to go out to porch and reports dependent on them to always assist so he doesn't go outside like he would like to. 01/29/24: Pt reports he has not been able to attempt this due to not having a male at home to assist him. 02/24/2024: Patient reports has been able to step outside on his porch a couple of time with use of walker and close caregiver support.  Goal status: MET  9. Patient will stand at kitchen counter or support bar for > 5 min without UE support to demonstrate improved standing posture/endurance/LE strength with standing ADLs to assist with toileting, changing clothes, or light kitchen activities.  Baseline: Patient able to stand approx 2.5 min without posterior lean and no UE support.  Goal status: NEW  ASSESSMENT :  CLINICAL  IMPRESSION:   Patient presented with great motivation today- able to stand from sitting position with just SBA and consistent today with retropulsion. He was challenged with static standing- exhibiting some retropulsion requiring CGA.  Continued focus on standing upright - weight shifting more forward to prevent posterior LOB while either standing static or performing other activities. Pt will continue to benefit from skilled therapy to address remaining deficits in order to improve overall QoL and return to PLOF.       OBJECTIVE IMPAIRMENTS Abnormal gait, decreased activity tolerance, decreased balance,  decreased coordination, decreased endurance, decreased mobility, difficulty walking, decreased ROM, decreased strength, improper body mechanics, postural dysfunction, and obesity.   ACTIVITY LIMITATIONS carrying, lifting, bending, sitting, standing, squatting, stairs, transfers, bed mobility, continence, bathing, toileting, dressing, hygiene/grooming, and locomotion level  PARTICIPATION LIMITATIONS: meal prep, cleaning, laundry, interpersonal relationship, driving, shopping, community activity, and yard work  PERSONAL FACTORS Fitness, Past/current experiences, Time since onset of injury/illness/exacerbation, and 3+ comorbidities: hx of brain tumor, past CVA, HTN, obesity, learning disability, DMII are also affecting patient's functional outcome.   REHAB POTENTIAL: Fair    CLINICAL DECISION MAKING: Stable/uncomplicated  EVALUATION COMPLEXITY: Low  PLAN: PT FREQUENCY: 1-2x/week  PT DURATION: 12 weeks  PLANNED INTERVENTIONS: Therapeutic exercises, Therapeutic activity, Neuromuscular re-education, Balance training, Gait training, Patient/Family education, Joint mobilization, Stair training, Vestibular training, Orthotic/Fit training, DME instructions, Dry Needling, Electrical stimulation, Wheelchair mobility training, Cryotherapy, Moist heat, Compression bandaging, Manual therapy, and Re-evaluation  PLAN FOR NEXT SESSION:  - gait with 180 deg turn -Retro walking -Gait training with BWS system as appropriate.  --Continue with functional strengthening exercises of the LE's  -Gait activities using RW. Incorporate proper technique with all transfers to decrease his risk of falling.   Reyes LOISE London PT  Physical Therapist- [facility]   2:09 PM 04/15/2024           "

## 2024-04-20 ENCOUNTER — Ambulatory Visit

## 2024-04-20 DIAGNOSIS — R278 Other lack of coordination: Secondary | ICD-10-CM

## 2024-04-20 DIAGNOSIS — M6281 Muscle weakness (generalized): Secondary | ICD-10-CM

## 2024-04-20 DIAGNOSIS — R296 Repeated falls: Secondary | ICD-10-CM

## 2024-04-20 DIAGNOSIS — I69351 Hemiplegia and hemiparesis following cerebral infarction affecting right dominant side: Secondary | ICD-10-CM

## 2024-04-20 DIAGNOSIS — R2689 Other abnormalities of gait and mobility: Secondary | ICD-10-CM

## 2024-04-20 DIAGNOSIS — R269 Unspecified abnormalities of gait and mobility: Secondary | ICD-10-CM

## 2024-04-20 DIAGNOSIS — R2681 Unsteadiness on feet: Secondary | ICD-10-CM | POA: Diagnosis not present

## 2024-04-20 DIAGNOSIS — R262 Difficulty in walking, not elsewhere classified: Secondary | ICD-10-CM

## 2024-04-20 NOTE — Therapy (Signed)
 "  OUTPATIENT PHYSICAL THERAPY TREATMENT   Patient Name: Curtis Powell MRN: [id number] DOB:November 02, 1965, 59 y.o., male Today's Date: 04/20/2024  PCP: Sherial Bail, MD  REFERRING PROVIDER: Sherial Bail, MD    PT End of Session - 04/20/24 1339     Visit Number 136    Number of Visits 150    Date for Recertification  05/20/24    Progress Note Due on Visit 140    PT Start Time 1340    Equipment Utilized During Treatment Gait belt    Activity Tolerance Patient tolerated treatment well    Behavior During Therapy [facility] for tasks assessed/performed               Past Medical History:  Diagnosis Date   Asthma    GERD (gastroesophageal reflux disease)    Hyperlipidemia    Hypertension    Obesity    Stroke [facility])    Past Surgical History:  Procedure Laterality Date   BRAIN SURGERY     Patient Active Problem List   Diagnosis Date Noted   Impaired glucose tolerance 03/23/2020   Learning disability 03/23/2020   Urinary and fecal incontinence 06/14/2019   Chronic pain syndrome 03/23/2019   Constipation 03/23/2019   Dysarthria 03/23/2019   Dysphagia 03/23/2019   Learning difficulty 03/23/2019   Morbid obesity (HCC) 03/23/2019   Muscle weakness 03/23/2019   Vitamin D deficiency 03/23/2019   Bilateral carotid artery stenosis 12/15/2018   Moderate aortic valve stenosis 12/15/2018   BMI 45.0-49.9, adult (HCC) 11/10/2018   Cerebrovascular accident (HCC) 05/02/2016   Hemiparesis affecting right side as late effect of cerebrovascular accident (HCC) 05/01/2016   Brain tumor (HCC) 11/24/2014   Gastro-esophageal reflux disease without esophagitis 11/24/2014   Accident due to mechanical fall without injury 08/03/2014   Essential hypertension 06/13/2014   Mixed hyperlipidemia 06/13/2014   Unspecified sequelae of cerebral infarction 06/13/2014   Thyromegaly 01/07/2014   Type 2 diabetes mellitus without complication 01/06/2014   Neck mass 09/08/2013   Swimmer's ear  09/08/2013   Erectile dysfunction 01/03/2013   Prediabetes 01/03/2013   ONSET DATE: 05/09/2016  REFERRING DIAG: I69.351 (ICD-10-CM) - Hemiplegia and hemiparesis following cerebral infarction affecting right dominant side   THERAPY DIAG:  Unsteadiness on feet  Difficulty in walking, not elsewhere classified  Muscle weakness (generalized)  Abnormality of gait and mobility  Other abnormalities of gait and mobility  Repeated falls  Hemiplegia and hemiparesis following cerebral infarction affecting right dominant side (HCC)  Other lack of coordination   Rationale for Evaluation and Treatment Rehabilitation  SUBJECTIVE:  SUBJECTIVE STATEMENT  Patient reports doing well today without complaints.   Pt accompanied by: family member   PERTINENT HISTORY:  The pt is a pleasant 59 yo male who returns to PT for evaluation of deficits s/o old CVA. Pt with hx of CVA Feb 2018. He was previously fully independent, but CVA caused significant deficits, mainly affecting his right side. Pt reports continued difficulty with walking and transfers. Pt still using WC for majority of mobility, otherwise he uses his RW. Pt lives with his mom who is his main caregiver. Patient had outpatient PT previously in clinic in 2023. He reports since discharge he has been completing his HEP and walking about every day. Pt would like to improve his walking mainly, but also would like to work on balance. He also reports difficulty with transfers. Pt's mom and his caregiver helps him with ADLs. However, he says he is now waiting for a new caregiver. Prior to this pt had caregiver 3x/week. PMH significant for hx of brain tumor, past CVA, DMII and HTN. See chart for additional information.  PAIN:  Are you having pain? Pt reports no pain  currently  PRECAUTIONS: fall  WEIGHT BEARING RESTRICTIONS none  PLOF: required assistance with ADLs (old CVA), prior to CVA in 2018 was indep.  History of Falls: Pt reports no recent falls  Home information: Pt lives with mother who is his primary caregiver. Previously had home aide, 3x/week but reports currently waiting for new one.  PATIENT GOALS: Pt would like to improve his ability to ambulate.  OBJECTIVE:  taken at [facility] unless otherwise noted:   DIAGNOSTIC FINDINGS: No recent imaging per chart  Objective measures taken at eval unless otherwise specified:   Posture: forward head posture, rounded shoulders  MMT LE: grossly  4+/5, exception hip flexors and abductors 4/5 each bilat   Functional tests:  5xSTS:  34 sec, use of BUE (multiple initial attempts with posterior LOB in chair)   : 0.096 m/s with RW, close CGA, and WC follow  Gait for distance (goal 150 ft): deferred due to time  Gait mechanics: impaired, decreased speed (see ), crouched posture, poor postural stability and relies heavily on BUE support on RW. Pt now with bilat AFOS   FOTO: 44 (goal 50)   TODAY'S TREATMENT : 04/15/2024  TA:   Static stand in from of walker without UE x 3 min then 1 min of lateral weight shifting x 2 trials  Dead lifts x 3 rds. -bodyweight x 10 (hand down toward lower cross bar of bariatric walker -3# DB x 10  -3# DB x 10  *Close CGA and VC   Standing Scap rows (matrix cable row) -7.5 # x 12 reps -12.5# x 12 reps  -12.5# x 12  reps  Ambulation in clinic with bariatric RW -   70 feet + 180 deg turn with walker- Focusing on erect posture and longer strides.    PATIENT EDUCATION: Education details: If pt can lean pelvis into support surface, his energy efficicency will be higher.  Person educated: Patient Education method: Explanation, Demonstration, Tactile cues, and Verbal cues Education comprehension: verbalized understanding, returned demonstration, and  verbal cues required  HOME EXERCISE PROGRAM:  Standing hip flexor stretch at counter, prolonged standing with erect posture for gluteal activation, HS curls with RTB  Access Code: [email or web address] URL: [email or web address] Date: 05/15/2023 Prepared by: Connell Kiss  Exercises - Seated Long Arc Quad with Ankle Weight  - 1 x daily - 7 x weekly -  2 sets - 10 reps  GOALS: Goals reviewed with patient? No  SHORT TERM GOALS: Target date: 11/12/2022  Pt will be independent with HEP in order to improve strength and balance in order to decrease fall risk and improve function at home. Baseline: instructed in HEP today, to provide printout next visit Goal status: MET  LONG TERM GOALS: Target date: 02/24/2024  Pt will increase FOTO to at least 50 in order to demonstrate improvement in function related to mobility and QoL. Baseline: 44 7/29:48 9/10:40 12/24/2022= 51 Goal status: MET  2.  Pt (< 60 yrs old) will complete 5xSTS test < 30 seconds indicating an increase in LE strength and improved balance. Baseline: 10/01/22 34 sec (multiple initial attempts with posterior LOB in chair) 12/20: 49 seconds 11/04/22:1:06, min A on multiple attempts to maintain balance and pt stays in flexed posture throughout test when coming to standing.   9/10: 27.5 sec, Pushes WC post consistently, close CGA  10/22: 47 seconds first attempt 12/5: 1 minute, 37 seconds, multiple attempts to get first stand, but no difficulty after that; 03/20/2023= 1 min 2 sec with 1UE on walker and 1 on armrest- some posterior lean- Close CGA; 06/17/2023= 38 sec with 1 UE on walker and 1 on armrest; 08/21/2023= 35 sec with 1UE on walker and 1 on armrest.  8/5: 36 sec with 1 UE on walker 1 on arm rest; 12/23/2023= 37.05 sec with some posterior LOB- CGA to min A to correct.  01/29/24: 54.77 sec with significant posterior LOB and needing modA to come upright into standing 02/26/24: 46 sec with some posterior LOB on 2 out of 5 stands-  min A to recover and stand 03/18/2024= 36 sec from green chair rather than seat of w/c with 1 arm on armrest and the other on walker-  Goal status: ONGOING  3.  Pt will increase to > 0.5 m/s (30 sec) as to improve gait speed for better home ambulation and reduce fall risk. Baseline: 10/01/22 0.096 m/s with RW, close CGA, and WC follow previously 12/27: 0.11 m/s 11/04/22: .056 m/s  9/10: 076 m/s 10/22: 0.64 m/s  11/26: .076 m/s; 03/20/2023= 0.08 m/s; 04/22/2023= 0.089 m/s; 05/15/2023 = 0.079 m/s; 05/27/2023= 0.11 m/s; 07/17/2023= 0.15 m/s; 08/21/2023= 0.14 m/s avg with Bariatric walker; 09/09/2023= 0.14 m/s; 10/02/2023= 0.09 m/s with Bariatric walker and Increased cues to swing RLE through vs. Previous sesisons. 11/13/2023= 0.12 m/s with Bariatric walker; 12/11/2023= 0.14 m/s; 12/23/2023=0.135 m/s 01/29/24: 3 min 25 sec, 0.05 m/s 02/26/2024: 0.14 m/s avg with BRW  Goal status: NOT PROGRESSING  4.  Pt will increase hip flexor and abductor  gross strength to 4+/5 bilat as to improve functional strength for independent gait, increased standing tolerance, and increased ADL ability. Baseline: 10/01/22 4+/5, exception hip flexors and abductors 4/5 each bilat  (previously 03/27/22: 4+/5 ) 9/10: Hip flexor 4/5, hip abductor 4+/5 10/22: hip flexor 4/5 hip abductor 4+/5 11/26: hip flexor 4+/5 hip abductor 4+/5 Goal status: MET  5.  Pt will ambulate 150 ft with BRW and WC follow with no rest breaks in less than 12 min to allow for improved mobility within home and increased independence. Baseline: 10/03/22 150' with BRW 04/03/22: 105 ft with BRW 7/29: 100 in 9:30 with pediatric RW 9/10:101 ft with BRW stooped posture worsened throughout test 12/24/2022- 150 feet using BRW, CGA with w/c follow- in 10 min 17 sec. Will keep goal to make sure he can do this consistently 10/22: 119 ft with BRW  8 min 23 seconds terminated by patient  11/26: 148 ft with BRW 14 min 34 seconds terminated by patient 03/20/2023= 152 feet using  BRW, CGA and w/c follow in 14 min 3 sec- Need to keep goal active to ensure consistency but will revised and add a time element to goal. 04/22/2023= 162 feet in 11 min 54 sec using bariatric RW, CGA and close CGA assist; 05/27/2023- 170 feet without rest break using bariatric RW. 06/17/2023= 160 feet with bariatric 4WW in 9 min- stopped due to fatigue.  Goal status: MET   6. Pt will decrease TUG to below 1 min 30 seconds/decrease in order to demonstrate decreased fall risk. Baseline: 06/17/2023= 2 min using bariatric 4WW; 09/09/2023= 1 min 50 sec with bariatric front wheeled walker.  12/02/2023= 1 min 44 sec;  12/23/2023= 2 min 8 sec avg with BRW 01/29/24: 3 min 42 sec with BRW 02/26/24: 1 min 58 sec with BRW and max VC for turning. 03/18/24: 1 min 45 sec with BRW and mod VC for turning Goal status: PROGRESSING   7. Pt will ambulate 300 ft with BRW, CGA and WC follow with no rest breaks in less than 20 min to allow for improved mobility within home/community and increased independence. Baseline: 06/17/2023= 160 feet with bariatric 4WW in 9 min- stopped due to fatigue. 07/17/2023- Patient able to walk 270 feet in > 20 min with BRW, CGA and close w/c follow today- VC for posture and for gait sequencing; 08/21/2023- Not specifically testing but patient has been walking 160 feet or more each Visit; 09/09/2023= Patient ambulated 180 feet overall at one time today using bariatric RW, CGA with VC for erect posture and for increased step length.  10/02/2023= 165 feet with Bariatric RW- close w/c follow and CGA. More diffiuclty with gait sequencing more consistent VC for erect posture.  12/02/2023- Paitent ambulated >100 feet in // bars today but will reassess using walker next visit.  12/11/2023= Patient ambulated 204 feet with BRW and Close CGA and w/c follow 01/29/24: Patient ambulated 111 feet with BRW and Close CGA and w/c follow (10 min 59 sec) before needing to rest 02/24/2024= Patient ambulated 180 feet with BRW and  Close CGA.  03/16/2024= 188 feet with BRW and close CGA Goal Status: PROGRESSING  8. Patient will report ability to walk out his front door including stepping over threshold using his walker with min A of caregiver to enable him to be more independent with household mobility and be able to go out to porch with less dependence on caregivers. Baseline: Patient dependent on caregivers to push him in his wheelchair to go out to porch and reports dependent on them to always assist so he doesn't go outside like he would like to. 01/29/24: Pt reports he has not been able to attempt this due to not having a male at home to assist him. 02/24/2024: Patient reports has been able to step outside on his porch a couple of time with use of walker and close caregiver support.  Goal status: MET  9. Patient will stand at kitchen counter or support bar for > 5 min without UE support to demonstrate improved standing posture/endurance/LE strength with standing ADLs to assist with toileting, changing clothes, or light kitchen activities.  Baseline: Patient able to stand approx 2.5 min without posterior lean and no UE support.  Goal status: NEW  ASSESSMENT :  CLINICAL IMPRESSION:   Continued focus on standing upright  including activities with and without UE support.  He was greatly challenged with some dead lifts and scap retraction requiring very close physical assist- min A CGA at times to keep upright standing as he exhibited both some anterior and posterior weight shifting. He was fatigued overall yet able to complete all activities with minimal rest breaks. Pt will continue to benefit from skilled therapy to address remaining deficits in order to improve overall QoL and return to PLOF.       OBJECTIVE IMPAIRMENTS Abnormal gait, decreased activity tolerance, decreased balance, decreased coordination, decreased endurance, decreased mobility, difficulty walking, decreased ROM, decreased strength, improper body  mechanics, postural dysfunction, and obesity.   ACTIVITY LIMITATIONS carrying, lifting, bending, sitting, standing, squatting, stairs, transfers, bed mobility, continence, bathing, toileting, dressing, hygiene/grooming, and locomotion level  PARTICIPATION LIMITATIONS: meal prep, cleaning, laundry, interpersonal relationship, driving, shopping, community activity, and yard work  PERSONAL FACTORS Fitness, Past/current experiences, Time since onset of injury/illness/exacerbation, and 3+ comorbidities: hx of brain tumor, past CVA, HTN, obesity, learning disability, DMII are also affecting patient's functional outcome.   REHAB POTENTIAL: Fair    CLINICAL DECISION MAKING: Stable/uncomplicated  EVALUATION COMPLEXITY: Low  PLAN: PT FREQUENCY: 1-2x/week  PT DURATION: 12 weeks  PLANNED INTERVENTIONS: Therapeutic exercises, Therapeutic activity, Neuromuscular re-education, Balance training, Gait training, Patient/Family education, Joint mobilization, Stair training, Vestibular training, Orthotic/Fit training, DME instructions, Dry Needling, Electrical stimulation, Wheelchair mobility training, Cryotherapy, Moist heat, Compression bandaging, Manual therapy, and Re-evaluation  PLAN FOR NEXT SESSION:  - gait with 180 deg turn -Retro walking -Gait training with BWS system as appropriate.  --Continue with functional strengthening exercises of the LE's  -Gait activities using RW. Incorporate proper technique with all transfers to decrease his risk of falling.   Reyes LOISE London PT  Physical Therapist- [facility]   4:10 PM 04/20/2024           "

## 2024-04-22 ENCOUNTER — Ambulatory Visit

## 2024-04-22 DIAGNOSIS — R2689 Other abnormalities of gait and mobility: Secondary | ICD-10-CM

## 2024-04-22 DIAGNOSIS — I69351 Hemiplegia and hemiparesis following cerebral infarction affecting right dominant side: Secondary | ICD-10-CM

## 2024-04-22 DIAGNOSIS — R2681 Unsteadiness on feet: Secondary | ICD-10-CM

## 2024-04-22 DIAGNOSIS — R296 Repeated falls: Secondary | ICD-10-CM

## 2024-04-22 DIAGNOSIS — M6281 Muscle weakness (generalized): Secondary | ICD-10-CM

## 2024-04-22 DIAGNOSIS — R269 Unspecified abnormalities of gait and mobility: Secondary | ICD-10-CM

## 2024-04-22 DIAGNOSIS — R262 Difficulty in walking, not elsewhere classified: Secondary | ICD-10-CM

## 2024-04-22 DIAGNOSIS — R278 Other lack of coordination: Secondary | ICD-10-CM

## 2024-04-22 DIAGNOSIS — R531 Weakness: Secondary | ICD-10-CM

## 2024-04-22 NOTE — Therapy (Signed)
 "  OUTPATIENT PHYSICAL THERAPY TREATMENT   Patient Name: HALFORD GOETZKE MRN: [id number] DOB:1965/07/03, 59 y.o., male Today's Date: 04/22/2024  PCP: Sherial Bail, MD  REFERRING PROVIDER: Sherial Bail, MD    PT End of Session - 04/22/24 1318     Visit Number 137    Number of Visits 150    Date for Recertification  05/20/24    Progress Note Due on Visit 140    PT Start Time 1314    PT Stop Time 1359    PT Time Calculation (min) 45 min    Equipment Utilized During Treatment Gait belt    Activity Tolerance Patient tolerated treatment well    Behavior During Therapy WFL for tasks assessed/performed               Past Medical History:  Diagnosis Date   Asthma    GERD (gastroesophageal reflux disease)    Hyperlipidemia    Hypertension    Obesity    Stroke [facility])    Past Surgical History:  Procedure Laterality Date   BRAIN SURGERY     Patient Active Problem List   Diagnosis Date Noted   Impaired glucose tolerance 03/23/2020   Learning disability 03/23/2020   Urinary and fecal incontinence 06/14/2019   Chronic pain syndrome 03/23/2019   Constipation 03/23/2019   Dysarthria 03/23/2019   Dysphagia 03/23/2019   Learning difficulty 03/23/2019   Morbid obesity (HCC) 03/23/2019   Muscle weakness 03/23/2019   Vitamin D deficiency 03/23/2019   Bilateral carotid artery stenosis 12/15/2018   Moderate aortic valve stenosis 12/15/2018   BMI 45.0-49.9, adult (HCC) 11/10/2018   Cerebrovascular accident (HCC) 05/02/2016   Hemiparesis affecting right side as late effect of cerebrovascular accident (HCC) 05/01/2016   Brain tumor (HCC) 11/24/2014   Gastro-esophageal reflux disease without esophagitis 11/24/2014   Accident due to mechanical fall without injury 08/03/2014   Essential hypertension 06/13/2014   Mixed hyperlipidemia 06/13/2014   Unspecified sequelae of cerebral infarction 06/13/2014   Thyromegaly 01/07/2014   Type 2 diabetes mellitus without  complication 01/06/2014   Neck mass 09/08/2013   Swimmer's ear 09/08/2013   Erectile dysfunction 01/03/2013   Prediabetes 01/03/2013   ONSET DATE: 05/09/2016  REFERRING DIAG: I69.351 (ICD-10-CM) - Hemiplegia and hemiparesis following cerebral infarction affecting right dominant side   THERAPY DIAG:  Unsteadiness on feet  Difficulty in walking, not elsewhere classified  Muscle weakness (generalized)  Abnormality of gait and mobility  Other abnormalities of gait and mobility  Repeated falls  Hemiplegia and hemiparesis following cerebral infarction affecting right dominant side (HCC)  Other lack of coordination  Left-sided weakness   Rationale for Evaluation and Treatment Rehabilitation  SUBJECTIVE:  SUBJECTIVE STATEMENT  Patient reports no particular soreness after last session. States feeling cold and stiff today.   Pt accompanied by: family member   PERTINENT HISTORY:  The pt is a pleasant 59 yo male who returns to PT for evaluation of deficits s/o old CVA. Pt with hx of CVA Feb 2018. He was previously fully independent, but CVA caused significant deficits, mainly affecting his right side. Pt reports continued difficulty with walking and transfers. Pt still using WC for majority of mobility, otherwise he uses his RW. Pt lives with his mom who is his main caregiver. Patient had outpatient PT previously in clinic in 2023. He reports since discharge he has been completing his HEP and walking about every day. Pt would like to improve his walking mainly, but also would like to work on balance. He also reports difficulty with transfers. Pt's mom and his caregiver helps him with ADLs. However, he says he is now waiting for a new caregiver. Prior to this pt had caregiver 3x/week. PMH significant for  hx of brain tumor, past CVA, DMII and HTN. See chart for additional information.  PAIN:  Are you having pain? Pt reports no pain currently  PRECAUTIONS: fall  WEIGHT BEARING RESTRICTIONS none  PLOF: required assistance with ADLs (old CVA), prior to CVA in 2018 was indep.  History of Falls: Pt reports no recent falls  Home information: Pt lives with mother who is his primary caregiver. Previously had home aide, 3x/week but reports currently waiting for new one.  PATIENT GOALS: Pt would like to improve his ability to ambulate.  OBJECTIVE:  taken at [facility] unless otherwise noted:   DIAGNOSTIC FINDINGS: No recent imaging per chart  Objective measures taken at eval unless otherwise specified:   Posture: forward head posture, rounded shoulders  MMT LE: grossly  4+/5, exception hip flexors and abductors 4/5 each bilat   Functional tests:  5xSTS:  34 sec, use of BUE (multiple initial attempts with posterior LOB in chair)   : 0.096 m/s with RW, close CGA, and WC follow  Gait for distance (goal 150 ft): deferred due to time  Gait mechanics: impaired, decreased speed (see ), crouched posture, poor postural stability and relies heavily on BUE support on RW. Pt now with bilat AFOS   FOTO: 44 (goal 50)   TODAY'S TREATMENT : 04/15/2024  TA:   Static stand in from of walker without UE x 3 min then 1 min of lateral weight shifting x 2 trials  Gait training - focusing on speed and coordination with movement;  Ambulation in clinic with bariatric RW - 10 Meters ( 12 sec) on 1st trial and 2 min 10 sec 2nd trial; 3rd trial= 1 min 50 sec and last trial on 1 min 38 sec     PATIENT EDUCATION: Education details: If pt can lean pelvis into support surface, his energy efficicency will be higher.  Person educated: Patient Education method: Explanation, Demonstration, Tactile cues, and Verbal cues Education comprehension: verbalized understanding, returned demonstration, and verbal  cues required  HOME EXERCISE PROGRAM:  Standing hip flexor stretch at counter, prolonged standing with erect posture for gluteal activation, HS curls with RTB  Access Code: [email or web address] URL: [email or web address] Date: 05/15/2023 Prepared by: Connell Kiss  Exercises - Seated Long Arc Quad with Ankle Weight  - 1 x daily - 7 x weekly - 2 sets - 10 reps  GOALS: Goals reviewed with patient? No  SHORT TERM GOALS: Target date: 11/12/2022  Pt will be independent  with HEP in order to improve strength and balance in order to decrease fall risk and improve function at home. Baseline: instructed in HEP today, to provide printout next visit Goal status: MET  LONG TERM GOALS: Target date: 02/24/2024  Pt will increase FOTO to at least 50 in order to demonstrate improvement in function related to mobility and QoL. Baseline: 44 7/29:48 9/10:40 12/24/2022= 51 Goal status: MET  2.  Pt (< 60 yrs old) will complete 5xSTS test < 30 seconds indicating an increase in LE strength and improved balance. Baseline: 10/01/22 34 sec (multiple initial attempts with posterior LOB in chair) 12/20: 49 seconds 11/04/22:1:06, min A on multiple attempts to maintain balance and pt stays in flexed posture throughout test when coming to standing.   9/10: 27.5 sec, Pushes WC post consistently, close CGA  10/22: 47 seconds first attempt 12/5: 1 minute, 37 seconds, multiple attempts to get first stand, but no difficulty after that; 03/20/2023= 1 min 2 sec with 1UE on walker and 1 on armrest- some posterior lean- Close CGA; 06/17/2023= 38 sec with 1 UE on walker and 1 on armrest; 08/21/2023= 35 sec with 1UE on walker and 1 on armrest.  8/5: 36 sec with 1 UE on walker 1 on arm rest; 12/23/2023= 37.05 sec with some posterior LOB- CGA to min A to correct.  01/29/24: 54.77 sec with significant posterior LOB and needing modA to come upright into standing 02/26/24: 46 sec with some posterior LOB on 2 out of 5 stands- min A to  recover and stand 03/18/2024= 36 sec from green chair rather than seat of w/c with 1 arm on armrest and the other on walker-  Goal status: ONGOING  3.  Pt will increase to > 0.5 m/s (30 sec) as to improve gait speed for better home ambulation and reduce fall risk. Baseline: 10/01/22 0.096 m/s with RW, close CGA, and WC follow previously 12/27: 0.11 m/s 11/04/22: .056 m/s  9/10: 076 m/s 10/22: 0.64 m/s  11/26: .076 m/s; 03/20/2023= 0.08 m/s; 04/22/2023= 0.089 m/s; 05/15/2023 = 0.079 m/s; 05/27/2023= 0.11 m/s; 07/17/2023= 0.15 m/s; 08/21/2023= 0.14 m/s avg with Bariatric walker; 09/09/2023= 0.14 m/s; 10/02/2023= 0.09 m/s with Bariatric walker and Increased cues to swing RLE through vs. Previous sesisons. 11/13/2023= 0.12 m/s with Bariatric walker; 12/11/2023= 0.14 m/s; 12/23/2023=0.135 m/s 01/29/24: 3 min 25 sec, 0.05 m/s 02/26/2024: 0.14 m/s avg with BRW  04/22/2024:  Goal status: NOT PROGRESSING  4.  Pt will increase hip flexor and abductor  gross strength to 4+/5 bilat as to improve functional strength for independent gait, increased standing tolerance, and increased ADL ability. Baseline: 10/01/22 4+/5, exception hip flexors and abductors 4/5 each bilat  (previously 03/27/22: 4+/5 ) 9/10: Hip flexor 4/5, hip abductor 4+/5 10/22: hip flexor 4/5 hip abductor 4+/5 11/26: hip flexor 4+/5 hip abductor 4+/5 Goal status: MET  5.  Pt will ambulate 150 ft with BRW and WC follow with no rest breaks in less than 12 min to allow for improved mobility within home and increased independence. Baseline: 10/03/22 150' with BRW 04/03/22: 105 ft with BRW 7/29: 100 in 9:30 with pediatric RW 9/10:101 ft with BRW stooped posture worsened throughout test 12/24/2022- 150 feet using BRW, CGA with w/c follow- in 10 min 17 sec. Will keep goal to make sure he can do this consistently 10/22: 119 ft with BRW 8 min 23 seconds terminated by patient  11/26: 148 ft with BRW 14 min 34 seconds terminated by patient 03/20/2023= 152  feet  using BRW, CGA and w/c follow in 14 min 3 sec- Need to keep goal active to ensure consistency but will revised and add a time element to goal. 04/22/2023= 162 feet in 11 min 54 sec using bariatric RW, CGA and close CGA assist; 05/27/2023- 170 feet without rest break using bariatric RW. 06/17/2023= 160 feet with bariatric 4WW in 9 min- stopped due to fatigue.  Goal status: MET   6. Pt will decrease TUG to below 1 min 30 seconds/decrease in order to demonstrate decreased fall risk. Baseline: 06/17/2023= 2 min using bariatric 4WW; 09/09/2023= 1 min 50 sec with bariatric front wheeled walker.  12/02/2023= 1 min 44 sec;  12/23/2023= 2 min 8 sec avg with BRW 01/29/24: 3 min 42 sec with BRW 02/26/24: 1 min 58 sec with BRW and max VC for turning. 03/18/24: 1 min 45 sec with BRW and mod VC for turning Goal status: PROGRESSING   7. Pt will ambulate 300 ft with BRW, CGA and WC follow with no rest breaks in less than 20 min to allow for improved mobility within home/community and increased independence. Baseline: 06/17/2023= 160 feet with bariatric 4WW in 9 min- stopped due to fatigue. 07/17/2023- Patient able to walk 270 feet in > 20 min with BRW, CGA and close w/c follow today- VC for posture and for gait sequencing; 08/21/2023- Not specifically testing but patient has been walking 160 feet or more each Visit; 09/09/2023= Patient ambulated 180 feet overall at one time today using bariatric RW, CGA with VC for erect posture and for increased step length.  10/02/2023= 165 feet with Bariatric RW- close w/c follow and CGA. More diffiuclty with gait sequencing more consistent VC for erect posture.  12/02/2023- Paitent ambulated >100 feet in // bars today but will reassess using walker next visit.  12/11/2023= Patient ambulated 204 feet with BRW and Close CGA and w/c follow 01/29/24: Patient ambulated 111 feet with BRW and Close CGA and w/c follow (10 min 59 sec) before needing to rest 02/24/2024= Patient ambulated 180 feet with  BRW and Close CGA.  03/16/2024= 188 feet with BRW and close CGA Goal Status: PROGRESSING  8. Patient will report ability to walk out his front door including stepping over threshold using his walker with min A of caregiver to enable him to be more independent with household mobility and be able to go out to porch with less dependence on caregivers. Baseline: Patient dependent on caregivers to push him in his wheelchair to go out to porch and reports dependent on them to always assist so he doesn't go outside like he would like to. 01/29/24: Pt reports he has not been able to attempt this due to not having a male at home to assist him. 02/24/2024: Patient reports has been able to step outside on his porch a couple of time with use of walker and close caregiver support.  Goal status: MET  9. Patient will stand at kitchen counter or support bar for > 5 min without UE support to demonstrate improved standing posture/endurance/LE strength with standing ADLs to assist with toileting, changing clothes, or light kitchen activities.  Baseline: Patient able to stand approx 2.5 min without posterior lean and no UE support.  Goal status: NEW  ASSESSMENT :  CLINICAL IMPRESSION:   Patient presented with slow start with gait and did improve overall with practice and max verbal cues. He was fatigued at end of session yet satisfied that his walking had improved.   Pt will  continue to benefit from skilled therapy to address remaining deficits in order to improve overall QoL and return to PLOF.       OBJECTIVE IMPAIRMENTS Abnormal gait, decreased activity tolerance, decreased balance, decreased coordination, decreased endurance, decreased mobility, difficulty walking, decreased ROM, decreased strength, improper body mechanics, postural dysfunction, and obesity.   ACTIVITY LIMITATIONS carrying, lifting, bending, sitting, standing, squatting, stairs, transfers, bed mobility, continence, bathing, toileting,  dressing, hygiene/grooming, and locomotion level  PARTICIPATION LIMITATIONS: meal prep, cleaning, laundry, interpersonal relationship, driving, shopping, community activity, and yard work  PERSONAL FACTORS Fitness, Past/current experiences, Time since onset of injury/illness/exacerbation, and 3+ comorbidities: hx of brain tumor, past CVA, HTN, obesity, learning disability, DMII are also affecting patient's functional outcome.   REHAB POTENTIAL: Fair    CLINICAL DECISION MAKING: Stable/uncomplicated  EVALUATION COMPLEXITY: Low  PLAN: PT FREQUENCY: 1-2x/week  PT DURATION: 12 weeks  PLANNED INTERVENTIONS: Therapeutic exercises, Therapeutic activity, Neuromuscular re-education, Balance training, Gait training, Patient/Family education, Joint mobilization, Stair training, Vestibular training, Orthotic/Fit training, DME instructions, Dry Needling, Electrical stimulation, Wheelchair mobility training, Cryotherapy, Moist heat, Compression bandaging, Manual therapy, and Re-evaluation  PLAN FOR NEXT SESSION:  - gait with 180 deg turn -Retro walking -Gait training with BWS system as appropriate.  --Continue with functional strengthening exercises of the LE's  -Gait activities using RW. Incorporate proper technique with all transfers to decrease his risk of falling.   Reyes LOISE London PT  Physical Therapist- [facility]   2:58 PM 04/22/24           "

## 2024-04-27 ENCOUNTER — Ambulatory Visit

## 2024-04-27 DIAGNOSIS — R278 Other lack of coordination: Secondary | ICD-10-CM

## 2024-04-27 DIAGNOSIS — M6281 Muscle weakness (generalized): Secondary | ICD-10-CM

## 2024-04-27 DIAGNOSIS — R269 Unspecified abnormalities of gait and mobility: Secondary | ICD-10-CM

## 2024-04-27 DIAGNOSIS — R262 Difficulty in walking, not elsewhere classified: Secondary | ICD-10-CM

## 2024-04-27 DIAGNOSIS — R296 Repeated falls: Secondary | ICD-10-CM

## 2024-04-27 DIAGNOSIS — I69351 Hemiplegia and hemiparesis following cerebral infarction affecting right dominant side: Secondary | ICD-10-CM

## 2024-04-27 DIAGNOSIS — R2689 Other abnormalities of gait and mobility: Secondary | ICD-10-CM

## 2024-04-27 DIAGNOSIS — R2681 Unsteadiness on feet: Secondary | ICD-10-CM

## 2024-04-27 NOTE — Therapy (Signed)
 "  OUTPATIENT PHYSICAL THERAPY TREATMENT   Patient Name: Curtis Powell MRN: [id number] DOB:Jan 18, 1966, 59 y.o., male Today's Date: 04/27/2024  PCP: Sherial Bail, MD  REFERRING PROVIDER: Sherial Bail, MD    PT End of Session - 04/27/24 1328     Visit Number 138    Number of Visits 150    Date for Recertification  05/20/24    Progress Note Due on Visit 140    PT Start Time 1315    PT Stop Time 1358    PT Time Calculation (min) 43 min    Equipment Utilized During Treatment Gait belt    Activity Tolerance Patient tolerated treatment well    Behavior During Therapy WFL for tasks assessed/performed               Past Medical History:  Diagnosis Date   Asthma    GERD (gastroesophageal reflux disease)    Hyperlipidemia    Hypertension    Obesity    Stroke [facility])    Past Surgical History:  Procedure Laterality Date   BRAIN SURGERY     Patient Active Problem List   Diagnosis Date Noted   Impaired glucose tolerance 03/23/2020   Learning disability 03/23/2020   Urinary and fecal incontinence 06/14/2019   Chronic pain syndrome 03/23/2019   Constipation 03/23/2019   Dysarthria 03/23/2019   Dysphagia 03/23/2019   Learning difficulty 03/23/2019   Morbid obesity (HCC) 03/23/2019   Muscle weakness 03/23/2019   Vitamin D deficiency 03/23/2019   Bilateral carotid artery stenosis 12/15/2018   Moderate aortic valve stenosis 12/15/2018   BMI 45.0-49.9, adult (HCC) 11/10/2018   Cerebrovascular accident (HCC) 05/02/2016   Hemiparesis affecting right side as late effect of cerebrovascular accident (HCC) 05/01/2016   Brain tumor (HCC) 11/24/2014   Gastro-esophageal reflux disease without esophagitis 11/24/2014   Accident due to mechanical fall without injury 08/03/2014   Essential hypertension 06/13/2014   Mixed hyperlipidemia 06/13/2014   Unspecified sequelae of cerebral infarction 06/13/2014   Thyromegaly 01/07/2014   Type 2 diabetes mellitus without  complication 01/06/2014   Neck mass 09/08/2013   Swimmer's ear 09/08/2013   Erectile dysfunction 01/03/2013   Prediabetes 01/03/2013   ONSET DATE: 05/09/2016  REFERRING DIAG: I69.351 (ICD-10-CM) - Hemiplegia and hemiparesis following cerebral infarction affecting right dominant side   THERAPY DIAG:  Unsteadiness on feet  Difficulty in walking, not elsewhere classified  Muscle weakness (generalized)  Abnormality of gait and mobility  Other abnormalities of gait and mobility  Repeated falls  Hemiplegia and hemiparesis following cerebral infarction affecting right dominant side (HCC)  Other lack of coordination   Rationale for Evaluation and Treatment Rehabilitation  SUBJECTIVE:  SUBJECTIVE STATEMENT  Patient reports doing well without any issues today.    Pt accompanied by: family member   PERTINENT HISTORY:  The pt is a pleasant 59 yo male who returns to PT for evaluation of deficits s/o old CVA. Pt with hx of CVA Feb 2018. He was previously fully independent, but CVA caused significant deficits, mainly affecting his right side. Pt reports continued difficulty with walking and transfers. Pt still using WC for majority of mobility, otherwise he uses his RW. Pt lives with his mom who is his main caregiver. Patient had outpatient PT previously in clinic in 2023. He reports since discharge he has been completing his HEP and walking about every day. Pt would like to improve his walking mainly, but also would like to work on balance. He also reports difficulty with transfers. Pt's mom and his caregiver helps him with ADLs. However, he says he is now waiting for a new caregiver. Prior to this pt had caregiver 3x/week. PMH significant for hx of brain tumor, past CVA, DMII and HTN. See chart for  additional information.  PAIN:  Are you having pain? Pt reports no pain currently  PRECAUTIONS: fall  WEIGHT BEARING RESTRICTIONS none  PLOF: required assistance with ADLs (old CVA), prior to CVA in 2018 was indep.  History of Falls: Pt reports no recent falls  Home information: Pt lives with mother who is his primary caregiver. Previously had home aide, 3x/week but reports currently waiting for new one.  PATIENT GOALS: Pt would like to improve his ability to ambulate.  OBJECTIVE:  taken at [facility] unless otherwise noted:   DIAGNOSTIC FINDINGS: No recent imaging per chart  Objective measures taken at eval unless otherwise specified:   Posture: forward head posture, rounded shoulders  MMT LE: grossly  4+/5, exception hip flexors and abductors 4/5 each bilat   Functional tests:  5xSTS:  34 sec, use of BUE (multiple initial attempts with posterior LOB in chair)   : 0.096 m/s with RW, close CGA, and WC follow  Gait for distance (goal 150 ft): deferred due to time  Gait mechanics: impaired, decreased speed (see ), crouched posture, poor postural stability and relies heavily on BUE support on RW. Pt now with bilat AFOS   FOTO: 44 (goal 50)   TODAY'S TREATMENT : 04/27/2024  TA:  Sit to stand with 1 arm on armest and 1 on bar in // bars x 15 reps- Patient with good overall technique- Reports a little tiring  Static stand in from of walker without UE x 3 min then 1 min of lateral weight shifting x 2 trials  Gait training - focusing on speed, turning ability, and coordination with movement in // bars today-  1) down and back including 2 (180 deg) turns- Max VC for step length and moving BUE along bars faster to keep moving forward. - Required 7 min 2 sec initially. 2) same as above in 3 min 53 sec  3) same as above in 3 min 49 sec 4) same as above in 3 min 7 sec 5) Same as above in 2 min 49 sec      PATIENT EDUCATION: Education details: If pt can lean pelvis  into support surface, his energy efficicency will be higher.  Person educated: Patient Education method: Explanation, Demonstration, Tactile cues, and Verbal cues Education comprehension: verbalized understanding, returned demonstration, and verbal cues required  HOME EXERCISE PROGRAM:  Standing hip flexor stretch at counter, prolonged standing with erect posture for gluteal  activation, HS curls with RTB  Access Code: [email or web address] URL: [email or web address] Date: 05/15/2023 Prepared by: Connell Kiss  Exercises - Seated Long Arc Quad with Ankle Weight  - 1 x daily - 7 x weekly - 2 sets - 10 reps  GOALS: Goals reviewed with patient? No  SHORT TERM GOALS: Target date: 11/12/2022  Pt will be independent with HEP in order to improve strength and balance in order to decrease fall risk and improve function at home. Baseline: instructed in HEP today, to provide printout next visit Goal status: MET  LONG TERM GOALS: Target date: 02/24/2024  Pt will increase FOTO to at least 50 in order to demonstrate improvement in function related to mobility and QoL. Baseline: 44 7/29:48 9/10:40 12/24/2022= 51 Goal status: MET  2.  Pt (< 60 yrs old) will complete 5xSTS test < 30 seconds indicating an increase in LE strength and improved balance. Baseline: 10/01/22 34 sec (multiple initial attempts with posterior LOB in chair) 12/20: 49 seconds 11/04/22:1:06, min A on multiple attempts to maintain balance and pt stays in flexed posture throughout test when coming to standing.   9/10: 27.5 sec, Pushes WC post consistently, close CGA  10/22: 47 seconds first attempt 12/5: 1 minute, 37 seconds, multiple attempts to get first stand, but no difficulty after that; 03/20/2023= 1 min 2 sec with 1UE on walker and 1 on armrest- some posterior lean- Close CGA; 06/17/2023= 38 sec with 1 UE on walker and 1 on armrest; 08/21/2023= 35 sec with 1UE on walker and 1 on armrest.  8/5: 36 sec with 1 UE on walker 1 on  arm rest; 12/23/2023= 37.05 sec with some posterior LOB- CGA to min A to correct.  01/29/24: 54.77 sec with significant posterior LOB and needing modA to come upright into standing 02/26/24: 46 sec with some posterior LOB on 2 out of 5 stands- min A to recover and stand 03/18/2024= 36 sec from green chair rather than seat of w/c with 1 arm on armrest and the other on walker-  Goal status: ONGOING  3.  Pt will increase to > 0.5 m/s (30 sec) as to improve gait speed for better home ambulation and reduce fall risk. Baseline: 10/01/22 0.096 m/s with RW, close CGA, and WC follow previously 12/27: 0.11 m/s 11/04/22: .056 m/s  9/10: 076 m/s 10/22: 0.64 m/s  11/26: .076 m/s; 03/20/2023= 0.08 m/s; 04/22/2023= 0.089 m/s; 05/15/2023 = 0.079 m/s; 05/27/2023= 0.11 m/s; 07/17/2023= 0.15 m/s; 08/21/2023= 0.14 m/s avg with Bariatric walker; 09/09/2023= 0.14 m/s; 10/02/2023= 0.09 m/s with Bariatric walker and Increased cues to swing RLE through vs. Previous sesisons. 11/13/2023= 0.12 m/s with Bariatric walker; 12/11/2023= 0.14 m/s; 12/23/2023=0.135 m/s 01/29/24: 3 min 25 sec, 0.05 m/s 02/26/2024: 0.14 m/s avg with BRW  04/22/2024:  Goal status: NOT PROGRESSING  4.  Pt will increase hip flexor and abductor  gross strength to 4+/5 bilat as to improve functional strength for independent gait, increased standing tolerance, and increased ADL ability. Baseline: 10/01/22 4+/5, exception hip flexors and abductors 4/5 each bilat  (previously 03/27/22: 4+/5 ) 9/10: Hip flexor 4/5, hip abductor 4+/5 10/22: hip flexor 4/5 hip abductor 4+/5 11/26: hip flexor 4+/5 hip abductor 4+/5 Goal status: MET  5.  Pt will ambulate 150 ft with BRW and WC follow with no rest breaks in less than 12 min to allow for improved mobility within home and increased independence. Baseline: 10/03/22 150' with BRW 04/03/22: 105 ft with BRW 7/29: 100 in 9:30 with pediatric RW  9/10:101 ft with BRW stooped posture worsened throughout test 12/24/2022- 150 feet  using BRW, CGA with w/c follow- in 10 min 17 sec. Will keep goal to make sure he can do this consistently 10/22: 119 ft with BRW 8 min 23 seconds terminated by patient  11/26: 148 ft with BRW 14 min 34 seconds terminated by patient 03/20/2023= 152 feet using BRW, CGA and w/c follow in 14 min 3 sec- Need to keep goal active to ensure consistency but will revised and add a time element to goal. 04/22/2023= 162 feet in 11 min 54 sec using bariatric RW, CGA and close CGA assist; 05/27/2023- 170 feet without rest break using bariatric RW. 06/17/2023= 160 feet with bariatric 4WW in 9 min- stopped due to fatigue.  Goal status: MET   6. Pt will decrease TUG to below 1 min 30 seconds/decrease in order to demonstrate decreased fall risk. Baseline: 06/17/2023= 2 min using bariatric 4WW; 09/09/2023= 1 min 50 sec with bariatric front wheeled walker.  12/02/2023= 1 min 44 sec;  12/23/2023= 2 min 8 sec avg with BRW 01/29/24: 3 min 42 sec with BRW 02/26/24: 1 min 58 sec with BRW and max VC for turning. 03/18/24: 1 min 45 sec with BRW and mod VC for turning Goal status: PROGRESSING   7. Pt will ambulate 300 ft with BRW, CGA and WC follow with no rest breaks in less than 20 min to allow for improved mobility within home/community and increased independence. Baseline: 06/17/2023= 160 feet with bariatric 4WW in 9 min- stopped due to fatigue. 07/17/2023- Patient able to walk 270 feet in > 20 min with BRW, CGA and close w/c follow today- VC for posture and for gait sequencing; 08/21/2023- Not specifically testing but patient has been walking 160 feet or more each Visit; 09/09/2023= Patient ambulated 180 feet overall at one time today using bariatric RW, CGA with VC for erect posture and for increased step length.  10/02/2023= 165 feet with Bariatric RW- close w/c follow and CGA. More diffiuclty with gait sequencing more consistent VC for erect posture.  12/02/2023- Paitent ambulated >100 feet in // bars today but will reassess using  walker next visit.  12/11/2023= Patient ambulated 204 feet with BRW and Close CGA and w/c follow 01/29/24: Patient ambulated 111 feet with BRW and Close CGA and w/c follow (10 min 59 sec) before needing to rest 02/24/2024= Patient ambulated 180 feet with BRW and Close CGA.  03/16/2024= 188 feet with BRW and close CGA Goal Status: PROGRESSING  8. Patient will report ability to walk out his front door including stepping over threshold using his walker with min A of caregiver to enable him to be more independent with household mobility and be able to go out to porch with less dependence on caregivers. Baseline: Patient dependent on caregivers to push him in his wheelchair to go out to porch and reports dependent on them to always assist so he doesn't go outside like he would like to. 01/29/24: Pt reports he has not been able to attempt this due to not having a male at home to assist him. 02/24/2024: Patient reports has been able to step outside on his porch a couple of time with use of walker and close caregiver support.  Goal status: MET  9. Patient will stand at kitchen counter or support bar for > 5 min without UE support to demonstrate improved standing posture/endurance/LE strength with standing ADLs to assist with toileting, changing clothes, or light kitchen activities.  Baseline:  Patient able to stand approx 2.5 min without posterior lean and no UE support.  Goal status: NEW  ASSESSMENT :  CLINICAL IMPRESSION:   Patient presented with another slower start with gait including difficulty turning in // bars and decreased overall step height/length requiring increased time overall and did improve overall with practice and max verbal cues- mostly with turning technique and advancing LE's.   Pt will continue to benefit from skilled therapy to address remaining deficits in order to improve overall QoL and return to PLOF.       OBJECTIVE IMPAIRMENTS Abnormal gait, decreased activity tolerance,  decreased balance, decreased coordination, decreased endurance, decreased mobility, difficulty walking, decreased ROM, decreased strength, improper body mechanics, postural dysfunction, and obesity.   ACTIVITY LIMITATIONS carrying, lifting, bending, sitting, standing, squatting, stairs, transfers, bed mobility, continence, bathing, toileting, dressing, hygiene/grooming, and locomotion level  PARTICIPATION LIMITATIONS: meal prep, cleaning, laundry, interpersonal relationship, driving, shopping, community activity, and yard work  PERSONAL FACTORS Fitness, Past/current experiences, Time since onset of injury/illness/exacerbation, and 3+ comorbidities: hx of brain tumor, past CVA, HTN, obesity, learning disability, DMII are also affecting patient's functional outcome.   REHAB POTENTIAL: Fair    CLINICAL DECISION MAKING: Stable/uncomplicated  EVALUATION COMPLEXITY: Low  PLAN: PT FREQUENCY: 1-2x/week  PT DURATION: 12 weeks  PLANNED INTERVENTIONS: Therapeutic exercises, Therapeutic activity, Neuromuscular re-education, Balance training, Gait training, Patient/Family education, Joint mobilization, Stair training, Vestibular training, Orthotic/Fit training, DME instructions, Dry Needling, Electrical stimulation, Wheelchair mobility training, Cryotherapy, Moist heat, Compression bandaging, Manual therapy, and Re-evaluation  PLAN FOR NEXT SESSION:  - gait with 180 deg turn -Retro walking -Gait training with BWS system as appropriate.  --Continue with functional strengthening exercises of the LE's  -Gait activities using RW. Incorporate proper technique with all transfers to decrease his risk of falling.   Reyes LOISE London PT  Physical Therapist- [facility]   10:45 PM 04/27/24           "

## 2024-04-29 ENCOUNTER — Ambulatory Visit

## 2024-04-29 DIAGNOSIS — R269 Unspecified abnormalities of gait and mobility: Secondary | ICD-10-CM

## 2024-04-29 DIAGNOSIS — R2681 Unsteadiness on feet: Secondary | ICD-10-CM | POA: Diagnosis not present

## 2024-04-29 DIAGNOSIS — R2689 Other abnormalities of gait and mobility: Secondary | ICD-10-CM

## 2024-04-29 DIAGNOSIS — M6281 Muscle weakness (generalized): Secondary | ICD-10-CM

## 2024-04-29 DIAGNOSIS — R278 Other lack of coordination: Secondary | ICD-10-CM

## 2024-04-29 DIAGNOSIS — I69351 Hemiplegia and hemiparesis following cerebral infarction affecting right dominant side: Secondary | ICD-10-CM

## 2024-04-29 DIAGNOSIS — R296 Repeated falls: Secondary | ICD-10-CM

## 2024-04-29 DIAGNOSIS — R262 Difficulty in walking, not elsewhere classified: Secondary | ICD-10-CM

## 2024-04-29 DIAGNOSIS — R531 Weakness: Secondary | ICD-10-CM

## 2024-04-29 NOTE — Therapy (Signed)
 "  OUTPATIENT PHYSICAL THERAPY TREATMENT   Patient Name: Curtis Powell MRN: [id number] DOB:17-Feb-1966, 59 y.o., male Today's Date: 04/29/2024  PCP: Sherial Bail, MD  REFERRING PROVIDER: Sherial Bail, MD    PT End of Session - 04/29/24 1321     Visit Number 139    Number of Visits 150    Date for Recertification  05/20/24    Progress Note Due on Visit 140    PT Start Time 1316    PT Stop Time 1354    PT Time Calculation (min) 38 min    Equipment Utilized During Treatment Gait belt    Activity Tolerance Patient tolerated treatment well    Behavior During Therapy WFL for tasks assessed/performed               Past Medical History:  Diagnosis Date   Asthma    GERD (gastroesophageal reflux disease)    Hyperlipidemia    Hypertension    Obesity    Stroke [facility])    Past Surgical History:  Procedure Laterality Date   BRAIN SURGERY     Patient Active Problem List   Diagnosis Date Noted   Impaired glucose tolerance 03/23/2020   Learning disability 03/23/2020   Urinary and fecal incontinence 06/14/2019   Chronic pain syndrome 03/23/2019   Constipation 03/23/2019   Dysarthria 03/23/2019   Dysphagia 03/23/2019   Learning difficulty 03/23/2019   Morbid obesity (HCC) 03/23/2019   Muscle weakness 03/23/2019   Vitamin D deficiency 03/23/2019   Bilateral carotid artery stenosis 12/15/2018   Moderate aortic valve stenosis 12/15/2018   BMI 45.0-49.9, adult (HCC) 11/10/2018   Cerebrovascular accident (HCC) 05/02/2016   Hemiparesis affecting right side as late effect of cerebrovascular accident (HCC) 05/01/2016   Brain tumor (HCC) 11/24/2014   Gastro-esophageal reflux disease without esophagitis 11/24/2014   Accident due to mechanical fall without injury 08/03/2014   Essential hypertension 06/13/2014   Mixed hyperlipidemia 06/13/2014   Unspecified sequelae of cerebral infarction 06/13/2014   Thyromegaly 01/07/2014   Type 2 diabetes mellitus without  complication 01/06/2014   Neck mass 09/08/2013   Swimmer's ear 09/08/2013   Erectile dysfunction 01/03/2013   Prediabetes 01/03/2013   ONSET DATE: 05/09/2016  REFERRING DIAG: I69.351 (ICD-10-CM) - Hemiplegia and hemiparesis following cerebral infarction affecting right dominant side   THERAPY DIAG:  Unsteadiness on feet  Difficulty in walking, not elsewhere classified  Muscle weakness (generalized)  Abnormality of gait and mobility  Other abnormalities of gait and mobility  Repeated falls  Hemiplegia and hemiparesis following cerebral infarction affecting right dominant side (HCC)  Other lack of coordination  Left-sided weakness   Rationale for Evaluation and Treatment Rehabilitation  SUBJECTIVE:  SUBJECTIVE STATEMENT  Patient reports not feeling great today- states his stomach was upset earlier and his aunt reports he could barely stand and pivot while going to bathroom just prior to visit.   Pt accompanied by: family member   PERTINENT HISTORY:  The pt is a pleasant 59 yo male who returns to PT for evaluation of deficits s/o old CVA. Pt with hx of CVA Feb 2018. He was previously fully independent, but CVA caused significant deficits, mainly affecting his right side. Pt reports continued difficulty with walking and transfers. Pt still using WC for majority of mobility, otherwise he uses his RW. Pt lives with his mom who is his main caregiver. Patient had outpatient PT previously in clinic in 2023. He reports since discharge he has been completing his HEP and walking about every day. Pt would like to improve his walking mainly, but also would like to work on balance. He also reports difficulty with transfers. Pt's mom and his caregiver helps him with ADLs. However, he says he is now waiting  for a new caregiver. Prior to this pt had caregiver 3x/week. PMH significant for hx of brain tumor, past CVA, DMII and HTN. See chart for additional information.  PAIN:  Are you having pain? Pt reports no pain currently  PRECAUTIONS: fall  WEIGHT BEARING RESTRICTIONS none  PLOF: required assistance with ADLs (old CVA), prior to CVA in 2018 was indep.  History of Falls: Pt reports no recent falls  Home information: Pt lives with mother who is his primary caregiver. Previously had home aide, 3x/week but reports currently waiting for new one.  PATIENT GOALS: Pt would like to improve his ability to ambulate.  OBJECTIVE:  taken at [facility] unless otherwise noted:   DIAGNOSTIC FINDINGS: No recent imaging per chart  Objective measures taken at eval unless otherwise specified:   Posture: forward head posture, rounded shoulders  MMT LE: grossly  4+/5, exception hip flexors and abductors 4/5 each bilat   Functional tests:  5xSTS:  34 sec, use of BUE (multiple initial attempts with posterior LOB in chair)   : 0.096 m/s with RW, close CGA, and WC follow  Gait for distance (goal 150 ft): deferred due to time  Gait mechanics: impaired, decreased speed (see ), crouched posture, poor postural stability and relies heavily on BUE support on RW. Pt now with bilat AFOS   FOTO: 44 (goal 50)   TODAY'S TREATMENT : 04/29/2024   Therex:  -Seated hip march- AROM x 20; 2# AW x 15 x 2 sets  -Seated knee ext - 2# x 15 reps (placed object for desired height)  -Seated ham curl 2# with use of disc under foot 2 x 15 reps  -seated hip add squeeze with ball- hold 5 sec x 10 reps x 2 sets -Seated hip abd with YTB -2 x 10 reps -Seated hip swing- Straight leg out to side lifting foot off floor and swinging it to on top of disc 2 x 10 reps         PATIENT EDUCATION: Education details: If pt can lean pelvis into support surface, his energy efficicency will be higher.  Person educated:  Patient Education method: Explanation, Demonstration, Tactile cues, and Verbal cues Education comprehension: verbalized understanding, returned demonstration, and verbal cues required  HOME EXERCISE PROGRAM:  Standing hip flexor stretch at counter, prolonged standing with erect posture for gluteal activation, HS curls with RTB  Access Code: [email or web address] URL: [email or web address] Date: 05/15/2023 Prepared by: Connell Kiss  Exercises -  Seated Long Arc Quad with Ankle Weight  - 1 x daily - 7 x weekly - 2 sets - 10 reps  GOALS: Goals reviewed with patient? No  SHORT TERM GOALS: Target date: 11/12/2022  Pt will be independent with HEP in order to improve strength and balance in order to decrease fall risk and improve function at home. Baseline: instructed in HEP today, to provide printout next visit Goal status: MET  LONG TERM GOALS: Target date: 02/24/2024  Pt will increase FOTO to at least 50 in order to demonstrate improvement in function related to mobility and QoL. Baseline: 44 7/29:48 9/10:40 12/24/2022= 51 Goal status: MET  2.  Pt (< 60 yrs old) will complete 5xSTS test < 30 seconds indicating an increase in LE strength and improved balance. Baseline: 10/01/22 34 sec (multiple initial attempts with posterior LOB in chair) 12/20: 49 seconds 11/04/22:1:06, min A on multiple attempts to maintain balance and pt stays in flexed posture throughout test when coming to standing.   9/10: 27.5 sec, Pushes WC post consistently, close CGA  10/22: 47 seconds first attempt 12/5: 1 minute, 37 seconds, multiple attempts to get first stand, but no difficulty after that; 03/20/2023= 1 min 2 sec with 1UE on walker and 1 on armrest- some posterior lean- Close CGA; 06/17/2023= 38 sec with 1 UE on walker and 1 on armrest; 08/21/2023= 35 sec with 1UE on walker and 1 on armrest.  8/5: 36 sec with 1 UE on walker 1 on arm rest; 12/23/2023= 37.05 sec with some posterior LOB- CGA to min A to correct.   01/29/24: 54.77 sec with significant posterior LOB and needing modA to come upright into standing 02/26/24: 46 sec with some posterior LOB on 2 out of 5 stands- min A to recover and stand 03/18/2024= 36 sec from green chair rather than seat of w/c with 1 arm on armrest and the other on walker-  Goal status: ONGOING  3.  Pt will increase to > 0.5 m/s (30 sec) as to improve gait speed for better home ambulation and reduce fall risk. Baseline: 10/01/22 0.096 m/s with RW, close CGA, and WC follow previously 12/27: 0.11 m/s 11/04/22: .056 m/s  9/10: 076 m/s 10/22: 0.64 m/s  11/26: .076 m/s; 03/20/2023= 0.08 m/s; 04/22/2023= 0.089 m/s; 05/15/2023 = 0.079 m/s; 05/27/2023= 0.11 m/s; 07/17/2023= 0.15 m/s; 08/21/2023= 0.14 m/s avg with Bariatric walker; 09/09/2023= 0.14 m/s; 10/02/2023= 0.09 m/s with Bariatric walker and Increased cues to swing RLE through vs. Previous sesisons. 11/13/2023= 0.12 m/s with Bariatric walker; 12/11/2023= 0.14 m/s; 12/23/2023=0.135 m/s 01/29/24: 3 min 25 sec, 0.05 m/s 02/26/2024: 0.14 m/s avg with BRW  04/22/2024:  Goal status: NOT PROGRESSING  4.  Pt will increase hip flexor and abductor  gross strength to 4+/5 bilat as to improve functional strength for independent gait, increased standing tolerance, and increased ADL ability. Baseline: 10/01/22 4+/5, exception hip flexors and abductors 4/5 each bilat  (previously 03/27/22: 4+/5 ) 9/10: Hip flexor 4/5, hip abductor 4+/5 10/22: hip flexor 4/5 hip abductor 4+/5 11/26: hip flexor 4+/5 hip abductor 4+/5 Goal status: MET  5.  Pt will ambulate 150 ft with BRW and WC follow with no rest breaks in less than 12 min to allow for improved mobility within home and increased independence. Baseline: 10/03/22 150' with BRW 04/03/22: 105 ft with BRW 7/29: 100 in 9:30 with pediatric RW 9/10:101 ft with BRW stooped posture worsened throughout test 12/24/2022- 150 feet using BRW, CGA with w/c follow- in 10  min 17 sec. Will keep goal to make sure he can  do this consistently 10/22: 119 ft with BRW 8 min 23 seconds terminated by patient  11/26: 148 ft with BRW 14 min 34 seconds terminated by patient 03/20/2023= 152 feet using BRW, CGA and w/c follow in 14 min 3 sec- Need to keep goal active to ensure consistency but will revised and add a time element to goal. 04/22/2023= 162 feet in 11 min 54 sec using bariatric RW, CGA and close CGA assist; 05/27/2023- 170 feet without rest break using bariatric RW. 06/17/2023= 160 feet with bariatric 4WW in 9 min- stopped due to fatigue.  Goal status: MET   6. Pt will decrease TUG to below 1 min 30 seconds/decrease in order to demonstrate decreased fall risk. Baseline: 06/17/2023= 2 min using bariatric 4WW; 09/09/2023= 1 min 50 sec with bariatric front wheeled walker.  12/02/2023= 1 min 44 sec;  12/23/2023= 2 min 8 sec avg with BRW 01/29/24: 3 min 42 sec with BRW 02/26/24: 1 min 58 sec with BRW and max VC for turning. 03/18/24: 1 min 45 sec with BRW and mod VC for turning Goal status: PROGRESSING   7. Pt will ambulate 300 ft with BRW, CGA and WC follow with no rest breaks in less than 20 min to allow for improved mobility within home/community and increased independence. Baseline: 06/17/2023= 160 feet with bariatric 4WW in 9 min- stopped due to fatigue. 07/17/2023- Patient able to walk 270 feet in > 20 min with BRW, CGA and close w/c follow today- VC for posture and for gait sequencing; 08/21/2023- Not specifically testing but patient has been walking 160 feet or more each Visit; 09/09/2023= Patient ambulated 180 feet overall at one time today using bariatric RW, CGA with VC for erect posture and for increased step length.  10/02/2023= 165 feet with Bariatric RW- close w/c follow and CGA. More diffiuclty with gait sequencing more consistent VC for erect posture.  12/02/2023- Paitent ambulated >100 feet in // bars today but will reassess using walker next visit.  12/11/2023= Patient ambulated 204 feet with BRW and Close CGA and  w/c follow 01/29/24: Patient ambulated 111 feet with BRW and Close CGA and w/c follow (10 min 59 sec) before needing to rest 02/24/2024= Patient ambulated 180 feet with BRW and Close CGA.  03/16/2024= 188 feet with BRW and close CGA Goal Status: PROGRESSING  8. Patient will report ability to walk out his front door including stepping over threshold using his walker with min A of caregiver to enable him to be more independent with household mobility and be able to go out to porch with less dependence on caregivers. Baseline: Patient dependent on caregivers to push him in his wheelchair to go out to porch and reports dependent on them to always assist so he doesn't go outside like he would like to. 01/29/24: Pt reports he has not been able to attempt this due to not having a male at home to assist him. 02/24/2024: Patient reports has been able to step outside on his porch a couple of time with use of walker and close caregiver support.  Goal status: MET  9. Patient will stand at kitchen counter or support bar for > 5 min without UE support to demonstrate improved standing posture/endurance/LE strength with standing ADLs to assist with toileting, changing clothes, or light kitchen activities.  Baseline: Patient able to stand approx 2.5 min without posterior lean and no UE support.  Goal status: NEW  ASSESSMENT :  CLINICAL IMPRESSION:   patient presents today with increased complaints of an upset stomach earlier this morning, with caregiver reporting increased difficulty with standing and transfers prior to the session. Due to patient not feeling well, treatment intensity was adjusted to prevent overexertion, and skilled intervention was limited to seated therapeutic exercise only. Patient completed seated lower extremity strengthening activities including hip marches, knee extension, hamstring curls, hip adduction, hip abduction, and hip swing exercises with verbal cueing for upright posture and  controlled movement. Patient tolerated session with increased fatigue but without exacerbation of symptoms. Continued deficits in functional strength and transfer ability persist, though participation was limited by current medical status. Plan to resume progressive functional training as tolerated once patients condition improves. Pt will continue to benefit from skilled therapy to address remaining deficits in order to improve overall QoL and return to PLOF.       OBJECTIVE IMPAIRMENTS Abnormal gait, decreased activity tolerance, decreased balance, decreased coordination, decreased endurance, decreased mobility, difficulty walking, decreased ROM, decreased strength, improper body mechanics, postural dysfunction, and obesity.   ACTIVITY LIMITATIONS carrying, lifting, bending, sitting, standing, squatting, stairs, transfers, bed mobility, continence, bathing, toileting, dressing, hygiene/grooming, and locomotion level  PARTICIPATION LIMITATIONS: meal prep, cleaning, laundry, interpersonal relationship, driving, shopping, community activity, and yard work  PERSONAL FACTORS Fitness, Past/current experiences, Time since onset of injury/illness/exacerbation, and 3+ comorbidities: hx of brain tumor, past CVA, HTN, obesity, learning disability, DMII are also affecting patient's functional outcome.   REHAB POTENTIAL: Fair    CLINICAL DECISION MAKING: Stable/uncomplicated  EVALUATION COMPLEXITY: Low  PLAN: PT FREQUENCY: 1-2x/week  PT DURATION: 12 weeks  PLANNED INTERVENTIONS: Therapeutic exercises, Therapeutic activity, Neuromuscular re-education, Balance training, Gait training, Patient/Family education, Joint mobilization, Stair training, Vestibular training, Orthotic/Fit training, DME instructions, Dry Needling, Electrical stimulation, Wheelchair mobility training, Cryotherapy, Moist heat, Compression bandaging, Manual therapy, and Re-evaluation  PLAN FOR NEXT SESSION:  - Progress report due  next visit gait with 180 deg turn -Retro walking -Gait training with BWS system as appropriate.  --Continue with functional strengthening exercises of the LE's  -Gait activities using RW. Incorporate proper technique with all transfers to decrease his risk of falling.   Reyes LOISE London PT  Physical Therapist- [facility]   1:53 PM 04/29/24           "

## 2024-05-04 ENCOUNTER — Ambulatory Visit

## 2024-05-06 ENCOUNTER — Ambulatory Visit

## 2024-05-06 DIAGNOSIS — R2681 Unsteadiness on feet: Secondary | ICD-10-CM

## 2024-05-06 DIAGNOSIS — R269 Unspecified abnormalities of gait and mobility: Secondary | ICD-10-CM

## 2024-05-06 DIAGNOSIS — M6281 Muscle weakness (generalized): Secondary | ICD-10-CM

## 2024-05-06 DIAGNOSIS — R2689 Other abnormalities of gait and mobility: Secondary | ICD-10-CM

## 2024-05-06 DIAGNOSIS — R278 Other lack of coordination: Secondary | ICD-10-CM

## 2024-05-06 DIAGNOSIS — R262 Difficulty in walking, not elsewhere classified: Secondary | ICD-10-CM

## 2024-05-06 DIAGNOSIS — I69351 Hemiplegia and hemiparesis following cerebral infarction affecting right dominant side: Secondary | ICD-10-CM

## 2024-05-06 DIAGNOSIS — R296 Repeated falls: Secondary | ICD-10-CM

## 2024-05-06 NOTE — Therapy (Signed)
 "  OUTPATIENT PHYSICAL THERAPY TREATMENT/Physical Therapy Progress Note   Dates of reporting period  03/18/2025   to   05/06/2024    Patient Name: Curtis Powell MRN: [id number] DOB:Apr 13, 1965, 59 y.o., male Today's Date: 05/06/2024  PCP: Sherial Bail, MD  REFERRING PROVIDER: Sherial Bail, MD    PT End of Session - 05/06/24 1327     Visit Number 140    Number of Visits 150    Date for Recertification  05/20/24    Progress Note Due on Visit 150    PT Start Time 1317    PT Stop Time 1357    PT Time Calculation (min) 40 min    Equipment Utilized During Treatment Gait belt    Activity Tolerance Patient tolerated treatment well    Behavior During Therapy WFL for tasks assessed/performed               Past Medical History:  Diagnosis Date   Asthma    GERD (gastroesophageal reflux disease)    Hyperlipidemia    Hypertension    Obesity    Stroke [facility])    Past Surgical History:  Procedure Laterality Date   BRAIN SURGERY     Patient Active Problem List   Diagnosis Date Noted   Impaired glucose tolerance 03/23/2020   Learning disability 03/23/2020   Urinary and fecal incontinence 06/14/2019   Chronic pain syndrome 03/23/2019   Constipation 03/23/2019   Dysarthria 03/23/2019   Dysphagia 03/23/2019   Learning difficulty 03/23/2019   Morbid obesity (HCC) 03/23/2019   Muscle weakness 03/23/2019   Vitamin D deficiency 03/23/2019   Bilateral carotid artery stenosis 12/15/2018   Moderate aortic valve stenosis 12/15/2018   BMI 45.0-49.9, adult (HCC) 11/10/2018   Cerebrovascular accident (HCC) 05/02/2016   Hemiparesis affecting right side as late effect of cerebrovascular accident (HCC) 05/01/2016   Brain tumor (HCC) 11/24/2014   Gastro-esophageal reflux disease without esophagitis 11/24/2014   Accident due to mechanical fall without injury 08/03/2014   Essential hypertension 06/13/2014   Mixed hyperlipidemia 06/13/2014   Unspecified sequelae of cerebral  infarction 06/13/2014   Thyromegaly 01/07/2014   Type 2 diabetes mellitus without complication 01/06/2014   Neck mass 09/08/2013   Swimmer's ear 09/08/2013   Erectile dysfunction 01/03/2013   Prediabetes 01/03/2013   ONSET DATE: 05/09/2016  REFERRING DIAG: I69.351 (ICD-10-CM) - Hemiplegia and hemiparesis following cerebral infarction affecting right dominant side   THERAPY DIAG:  Unsteadiness on feet  Difficulty in walking, not elsewhere classified  Muscle weakness (generalized)  Abnormality of gait and mobility  Other abnormalities of gait and mobility  Repeated falls  Hemiplegia and hemiparesis following cerebral infarction affecting right dominant side (HCC)  Other lack of coordination   Rationale for Evaluation and Treatment Rehabilitation  SUBJECTIVE:  SUBJECTIVE STATEMENT  Patient reports doing much better this week   Pt accompanied by: family member   PERTINENT HISTORY:  The pt is a pleasant 59 yo male who returns to PT for evaluation of deficits s/o old CVA. Pt with hx of CVA Feb 2018. He was previously fully independent, but CVA caused significant deficits, mainly affecting his right side. Pt reports continued difficulty with walking and transfers. Pt still using WC for majority of mobility, otherwise he uses his RW. Pt lives with his mom who is his main caregiver. Patient had outpatient PT previously in clinic in 2023. He reports since discharge he has been completing his HEP and walking about every day. Pt would like to improve his walking mainly, but also would like to work on balance. He also reports difficulty with transfers. Pt's mom and his caregiver helps him with ADLs. However, he says he is now waiting for a new caregiver. Prior to this pt had caregiver 3x/week. PMH  significant for hx of brain tumor, past CVA, DMII and HTN. See chart for additional information.  PAIN:  Are you having pain? Pt reports no pain currently  PRECAUTIONS: fall  WEIGHT BEARING RESTRICTIONS none  PLOF: required assistance with ADLs (old CVA), prior to CVA in 2018 was indep.  History of Falls: Pt reports no recent falls  Home information: Pt lives with mother who is his primary caregiver. Previously had home aide, 3x/week but reports currently waiting for new one.  PATIENT GOALS: Pt would like to improve his ability to ambulate.  OBJECTIVE:  taken at [facility] unless otherwise noted:   DIAGNOSTIC FINDINGS: No recent imaging per chart  Objective measures taken at eval unless otherwise specified:   Posture: forward head posture, rounded shoulders  MMT LE: grossly  4+/5, exception hip flexors and abductors 4/5 each bilat   Functional tests:  5xSTS:  34 sec, use of BUE (multiple initial attempts with posterior LOB in chair)   : 0.096 m/s with RW, close CGA, and WC follow  Gait for distance (goal 150 ft): deferred due to time  Gait mechanics: impaired, decreased speed (see ), crouched posture, poor postural stability and relies heavily on BUE support on RW. Pt now with bilat AFOS   FOTO: 44 (goal 50)   TODAY'S TREATMENT : 05/06/2024   TA:    Sit to stand x 8 with 1 arm on armrest and the other on walker handle- VC to lean forward!  Positioned a 1/2 foam roll between sides of walker and had patient ambulate with plan to raise his knees up to foam roll as feedback for height. Patient ambulated around 120 feet today with bariatric RW- improved step height with use of foam for feedback but as he fatigued his step height declined.   Standing at wall- attempting to push his thoracic ext - VC to look ahead and drive his posterior shoulder toward wall- able to stand in more erect posture approx 5 min.   Patient performed 1 more round of sit to stand x 3 but  fatigued at end of session. He also reported needing to go to bathroom.        PATIENT EDUCATION: Education details: If pt can lean pelvis into support surface, his energy efficicency will be higher.  Person educated: Patient Education method: Explanation, Demonstration, Tactile cues, and Verbal cues Education comprehension: verbalized understanding, returned demonstration, and verbal cues required  HOME EXERCISE PROGRAM:  Standing hip flexor stretch at counter, prolonged standing with erect posture  for gluteal activation, HS curls with RTB  Access Code: [email or web address] URL: [email or web address] Date: 05/15/2023 Prepared by: Connell Kiss  Exercises - Seated Long Arc Quad with Ankle Weight  - 1 x daily - 7 x weekly - 2 sets - 10 reps  GOALS: Goals reviewed with patient? No  SHORT TERM GOALS: Target date: 11/12/2022  Pt will be independent with HEP in order to improve strength and balance in order to decrease fall risk and improve function at home. Baseline: instructed in HEP today, to provide printout next visit Goal status: MET  LONG TERM GOALS: Target date: 02/24/2024  Pt will increase FOTO to at least 50 in order to demonstrate improvement in function related to mobility and QoL. Baseline: 44 7/29:48 9/10:40 12/24/2022= 51 Goal status: MET  2.  Pt (< 60 yrs old) will complete 5xSTS test < 30 seconds indicating an increase in LE strength and improved balance. Baseline: 10/01/22 34 sec (multiple initial attempts with posterior LOB in chair) 12/20: 49 seconds 11/04/22:1:06, min A on multiple attempts to maintain balance and pt stays in flexed posture throughout test when coming to standing.   9/10: 27.5 sec, Pushes WC post consistently, close CGA  10/22: 47 seconds first attempt 12/5: 1 minute, 37 seconds, multiple attempts to get first stand, but no difficulty after that; 03/20/2023= 1 min 2 sec with 1UE on walker and 1 on armrest- some posterior lean- Close CGA;  06/17/2023= 38 sec with 1 UE on walker and 1 on armrest; 08/21/2023= 35 sec with 1UE on walker and 1 on armrest.  8/5: 36 sec with 1 UE on walker 1 on arm rest; 12/23/2023= 37.05 sec with some posterior LOB- CGA to min A to correct.  01/29/24: 54.77 sec with significant posterior LOB and needing modA to come upright into standing 02/26/24: 46 sec with some posterior LOB on 2 out of 5 stands- min A to recover and stand 03/18/2024= 36 sec from green chair rather than seat of w/c with 1 arm on armrest and the other on walker-  Goal status: ONGOING  3.  Pt will increase to > 0.5 m/s (30 sec) as to improve gait speed for better home ambulation and reduce fall risk. Baseline: 10/01/22 0.096 m/s with RW, close CGA, and WC follow previously 12/27: 0.11 m/s 11/04/22: .056 m/s  9/10: 076 m/s 10/22: 0.64 m/s  11/26: .076 m/s; 03/20/2023= 0.08 m/s; 04/22/2023= 0.089 m/s; 05/15/2023 = 0.079 m/s; 05/27/2023= 0.11 m/s; 07/17/2023= 0.15 m/s; 08/21/2023= 0.14 m/s avg with Bariatric walker; 09/09/2023= 0.14 m/s; 10/02/2023= 0.09 m/s with Bariatric walker and Increased cues to swing RLE through vs. Previous sesisons. 11/13/2023= 0.12 m/s with Bariatric walker; 12/11/2023= 0.14 m/s; 12/23/2023=0.135 m/s 01/29/24: 3 min 25 sec, 0.05 m/s 02/26/2024: 0.14 m/s avg with BRW  04/22/2024:  Goal status: NOT PROGRESSING  4.  Pt will increase hip flexor and abductor  gross strength to 4+/5 bilat as to improve functional strength for independent gait, increased standing tolerance, and increased ADL ability. Baseline: 10/01/22 4+/5, exception hip flexors and abductors 4/5 each bilat  (previously 03/27/22: 4+/5 ) 9/10: Hip flexor 4/5, hip abductor 4+/5 10/22: hip flexor 4/5 hip abductor 4+/5 11/26: hip flexor 4+/5 hip abductor 4+/5 Goal status: MET  5.  Pt will ambulate 150 ft with BRW and WC follow with no rest breaks in less than 12 min to allow for improved mobility within home and increased independence. Baseline: 10/03/22 150' with BRW  04/03/22: 105 ft with BRW 7/29: 100 in 9:30  with pediatric RW 9/10:101 ft with BRW stooped posture worsened throughout test 12/24/2022- 150 feet using BRW, CGA with w/c follow- in 10 min 17 sec. Will keep goal to make sure he can do this consistently 10/22: 119 ft with BRW 8 min 23 seconds terminated by patient  11/26: 148 ft with BRW 14 min 34 seconds terminated by patient 03/20/2023= 152 feet using BRW, CGA and w/c follow in 14 min 3 sec- Need to keep goal active to ensure consistency but will revised and add a time element to goal. 04/22/2023= 162 feet in 11 min 54 sec using bariatric RW, CGA and close CGA assist; 05/27/2023- 170 feet without rest break using bariatric RW. 06/17/2023= 160 feet with bariatric 4WW in 9 min- stopped due to fatigue.  Goal status: MET   6. Pt will decrease TUG to below 1 min 30 seconds/decrease in order to demonstrate decreased fall risk. Baseline: 06/17/2023= 2 min using bariatric 4WW; 09/09/2023= 1 min 50 sec with bariatric front wheeled walker.  12/02/2023= 1 min 44 sec;  12/23/2023= 2 min 8 sec avg with BRW 01/29/24: 3 min 42 sec with BRW 02/26/24: 1 min 58 sec with BRW and max VC for turning. 03/18/24: 1 min 45 sec with BRW and mod VC for turning Goal status: PROGRESSING   7. Pt will ambulate 300 ft with BRW, CGA and WC follow with no rest breaks in less than 20 min to allow for improved mobility within home/community and increased independence. Baseline: 06/17/2023= 160 feet with bariatric 4WW in 9 min- stopped due to fatigue. 07/17/2023- Patient able to walk 270 feet in > 20 min with BRW, CGA and close w/c follow today- VC for posture and for gait sequencing; 08/21/2023- Not specifically testing but patient has been walking 160 feet or more each Visit; 09/09/2023= Patient ambulated 180 feet overall at one time today using bariatric RW, CGA with VC for erect posture and for increased step length.  10/02/2023= 165 feet with Bariatric RW- close w/c follow and CGA. More  diffiuclty with gait sequencing more consistent VC for erect posture.  12/02/2023- Paitent ambulated >100 feet in // bars today but will reassess using walker next visit.  12/11/2023= Patient ambulated 204 feet with BRW and Close CGA and w/c follow 01/29/24: Patient ambulated 111 feet with BRW and Close CGA and w/c follow (10 min 59 sec) before needing to rest 02/24/2024= Patient ambulated 180 feet with BRW and Close CGA.  03/16/2024= 188 feet with BRW and close CGA Goal Status: PROGRESSING  8. Patient will report ability to walk out his front door including stepping over threshold using his walker with min A of caregiver to enable him to be more independent with household mobility and be able to go out to porch with less dependence on caregivers. Baseline: Patient dependent on caregivers to push him in his wheelchair to go out to porch and reports dependent on them to always assist so he doesn't go outside like he would like to. 01/29/24: Pt reports he has not been able to attempt this due to not having a male at home to assist him. 02/24/2024: Patient reports has been able to step outside on his porch a couple of time with use of walker and close caregiver support.  Goal status: MET  9. Patient will stand at kitchen counter or support bar for > 5 min without UE support to demonstrate improved standing posture/endurance/LE strength with standing ADLs to assist with toileting, changing clothes, or light kitchen activities.  Baseline: Patient able to stand approx 2.5 min without posterior lean and no UE support.  Goal status: NEW  ASSESSMENT :  CLINICAL IMPRESSION:   The patient demonstrates improving functional mobility and gait mechanics but continues to show significant weakness, postural deficits, and reduced endurance. During sit to stand training the patient required one upper extremity on the armrest and the opposite hand on the walker and continued to need verbal cues for adequate forward  trunk lean. Gait training with a bariatric rolling walker and use of a foam roll for step height feedback resulted in improved initial foot clearance although step height decreased as fatigue increased over the course of approximately one hundred twenty feet. Postural training at the wall revealed the patient can achieve a more upright thoracic posture for up to five minutes with cueing to elevate gaze and retract posterior shoulders but this also contributes to overall fatigue. Overall the patient is progressing but continues to require skilled intervention to improve strength, posture, endurance, and safe functional mobility.     OBJECTIVE IMPAIRMENTS Abnormal gait, decreased activity tolerance, decreased balance, decreased coordination, decreased endurance, decreased mobility, difficulty walking, decreased ROM, decreased strength, improper body mechanics, postural dysfunction, and obesity.   ACTIVITY LIMITATIONS carrying, lifting, bending, sitting, standing, squatting, stairs, transfers, bed mobility, continence, bathing, toileting, dressing, hygiene/grooming, and locomotion level  PARTICIPATION LIMITATIONS: meal prep, cleaning, laundry, interpersonal relationship, driving, shopping, community activity, and yard work  PERSONAL FACTORS Fitness, Past/current experiences, Time since onset of injury/illness/exacerbation, and 3+ comorbidities: hx of brain tumor, past CVA, HTN, obesity, learning disability, DMII are also affecting patient's functional outcome.   REHAB POTENTIAL: Fair    CLINICAL DECISION MAKING: Stable/uncomplicated  EVALUATION COMPLEXITY: Low  PLAN: PT FREQUENCY: 1-2x/week  PT DURATION: 12 weeks  PLANNED INTERVENTIONS: Therapeutic exercises, Therapeutic activity, Neuromuscular re-education, Balance training, Gait training, Patient/Family education, Joint mobilization, Stair training, Vestibular training, Orthotic/Fit training, DME instructions, Dry Needling, Electrical  stimulation, Wheelchair mobility training, Cryotherapy, Moist heat, Compression bandaging, Manual therapy, and Re-evaluation  PLAN FOR NEXT SESSION:  -Gait with 180 deg turn -Retro walking -Gait training with BWS system as appropriate.  --Continue with functional strengthening exercises of the LE's  -Gait activities using RW. Incorporate proper technique with all transfers to decrease his risk of falling.   Reyes LOISE London PT  Physical Therapist- [facility]   2:38 PM 05/06/24           "

## 2024-05-11 ENCOUNTER — Ambulatory Visit

## 2024-05-13 ENCOUNTER — Ambulatory Visit

## 2024-05-13 DIAGNOSIS — I69351 Hemiplegia and hemiparesis following cerebral infarction affecting right dominant side: Secondary | ICD-10-CM

## 2024-05-13 DIAGNOSIS — R278 Other lack of coordination: Secondary | ICD-10-CM

## 2024-05-13 DIAGNOSIS — M6281 Muscle weakness (generalized): Secondary | ICD-10-CM

## 2024-05-13 DIAGNOSIS — R2681 Unsteadiness on feet: Secondary | ICD-10-CM

## 2024-05-13 DIAGNOSIS — R269 Unspecified abnormalities of gait and mobility: Secondary | ICD-10-CM

## 2024-05-13 DIAGNOSIS — R2689 Other abnormalities of gait and mobility: Secondary | ICD-10-CM

## 2024-05-13 DIAGNOSIS — R296 Repeated falls: Secondary | ICD-10-CM

## 2024-05-13 DIAGNOSIS — R262 Difficulty in walking, not elsewhere classified: Secondary | ICD-10-CM

## 2024-05-13 NOTE — Therapy (Signed)
 "  OUTPATIENT PHYSICAL THERAPY TREATMENT  Patient Name: Curtis Powell MRN: [id number] DOB:July 22, 1965, 59 y.o., male Today's Date: 05/13/2024  PCP: Sherial Bail, MD  REFERRING PROVIDER: Sherial Bail, MD    PT End of Session - 05/13/24 1322     Visit Number 141    Number of Visits 150    Date for Recertification  05/20/24    Progress Note Due on Visit 150    PT Start Time 1314    PT Stop Time 1354    PT Time Calculation (min) 40 min    Equipment Utilized During Treatment Gait belt    Activity Tolerance Patient tolerated treatment well    Behavior During Therapy WFL for tasks assessed/performed                Past Medical History:  Diagnosis Date   Asthma    GERD (gastroesophageal reflux disease)    Hyperlipidemia    Hypertension    Obesity    Stroke [facility])    Past Surgical History:  Procedure Laterality Date   BRAIN SURGERY     Patient Active Problem List   Diagnosis Date Noted   Impaired glucose tolerance 03/23/2020   Learning disability 03/23/2020   Urinary and fecal incontinence 06/14/2019   Chronic pain syndrome 03/23/2019   Constipation 03/23/2019   Dysarthria 03/23/2019   Dysphagia 03/23/2019   Learning difficulty 03/23/2019   Morbid obesity (HCC) 03/23/2019   Muscle weakness 03/23/2019   Vitamin D deficiency 03/23/2019   Bilateral carotid artery stenosis 12/15/2018   Moderate aortic valve stenosis 12/15/2018   BMI 45.0-49.9, adult (HCC) 11/10/2018   Cerebrovascular accident (HCC) 05/02/2016   Hemiparesis affecting right side as late effect of cerebrovascular accident (HCC) 05/01/2016   Brain tumor (HCC) 11/24/2014   Gastro-esophageal reflux disease without esophagitis 11/24/2014   Accident due to mechanical fall without injury 08/03/2014   Essential hypertension 06/13/2014   Mixed hyperlipidemia 06/13/2014   Unspecified sequelae of cerebral infarction 06/13/2014   Thyromegaly 01/07/2014   Type 2 diabetes mellitus without  complication 01/06/2014   Neck mass 09/08/2013   Swimmer's ear 09/08/2013   Erectile dysfunction 01/03/2013   Prediabetes 01/03/2013   ONSET DATE: 05/09/2016  REFERRING DIAG: I69.351 (ICD-10-CM) - Hemiplegia and hemiparesis following cerebral infarction affecting right dominant side   THERAPY DIAG:  Unsteadiness on feet  Difficulty in walking, not elsewhere classified  Muscle weakness (generalized)  Abnormality of gait and mobility  Other abnormalities of gait and mobility  Repeated falls  Hemiplegia and hemiparesis following cerebral infarction affecting right dominant side (HCC)  Other lack of coordination   Rationale for Evaluation and Treatment Rehabilitation  SUBJECTIVE:  SUBJECTIVE STATEMENT  Patient reports doing well overall - no issues since last visit except limited walking- dealing with winter snowy weather.   Pt accompanied by: family member   PERTINENT HISTORY:  The pt is a pleasant 59 yo male who returns to PT for evaluation of deficits s/o old CVA. Pt with hx of CVA Feb 2018. He was previously fully independent, but CVA caused significant deficits, mainly affecting his right side. Pt reports continued difficulty with walking and transfers. Pt still using WC for majority of mobility, otherwise he uses his RW. Pt lives with his mom who is his main caregiver. Patient had outpatient PT previously in clinic in 2023. He reports since discharge he has been completing his HEP and walking about every day. Pt would like to improve his walking mainly, but also would like to work on balance. He also reports difficulty with transfers. Pt's mom and his caregiver helps him with ADLs. However, he says he is now waiting for a new caregiver. Prior to this pt had caregiver 3x/week. PMH significant  for hx of brain tumor, past CVA, DMII and HTN. See chart for additional information.  PAIN:  Are you having pain? Pt reports no pain currently  PRECAUTIONS: fall  WEIGHT BEARING RESTRICTIONS none  PLOF: required assistance with ADLs (old CVA), prior to CVA in 2018 was indep.  History of Falls: Pt reports no recent falls  Home information: Pt lives with mother who is his primary caregiver. Previously had home aide, 3x/week but reports currently waiting for new one.  PATIENT GOALS: Pt would like to improve his ability to ambulate.  OBJECTIVE:  [facility] unless otherwise noted:   DIAGNOSTIC FINDINGS: No recent imaging per chart  Objective measures taken at eval unless otherwise specified:   Posture: forward head posture, rounded shoulders  MMT LE: grossly  4+/5, exception hip flexors and abductors 4/5 each bilat   Functional tests:  5xSTS:  34 sec, use of BUE (multiple initial attempts with posterior LOB in chair)   : 0.096 m/s with RW, close CGA, and WC follow  Gait for distance (goal 150 ft): deferred due to time  Gait mechanics: impaired, decreased speed (see ), crouched posture, poor postural stability and relies heavily on BUE support on RW. Pt now with bilat AFOS   FOTO: 44 (goal 50)   TODAY'S TREATMENT : 05/11/2024   TA:    Sit to stand x 10 with 1 arm on armrest and the other on walker handle- VC to lean forward and occasional increased physical assist- min A at times today.   Static standing in front of bariatric RW- 5 min today- More difficulty with erect standing and some retropulsion today- Min A at times to keep from falling.   Ambulation in clinic x 160 feet and another 109 feet- total- 30 min required to complete with 2 min rest break between walking. Max VC today to try to increase step length- drive knees toward ceiling. Patient reported not sure what was going on- just slow and fatigued today.       PATIENT EDUCATION: Education details:  If pt can lean pelvis into support surface, his energy efficicency will be higher.  Person educated: Patient Education method: Explanation, Demonstration, Tactile cues, and Verbal cues Education comprehension: verbalized understanding, returned demonstration, and verbal cues required  HOME EXERCISE PROGRAM:  Standing hip flexor stretch at counter, prolonged standing with erect posture for gluteal activation, HS curls with RTB  Access Code: [email or web address] URL: [email or web address]  Date: 05/15/2023 Prepared by: Connell Kiss  Exercises - Seated Long Arc Quad with Ankle Weight  - 1 x daily - 7 x weekly - 2 sets - 10 reps  GOALS: Goals reviewed with patient? No  SHORT TERM GOALS: Target date: 11/12/2022  Pt will be independent with HEP in order to improve strength and balance in order to decrease fall risk and improve function at home. Baseline: instructed in HEP today, to provide printout next visit Goal status: MET  LONG TERM GOALS: Target date: 02/24/2024  Pt will increase FOTO to at least 50 in order to demonstrate improvement in function related to mobility and QoL. Baseline: 44 7/29:48 9/10:40 12/24/2022= 51 Goal status: MET  2.  Pt (< 60 yrs old) will complete 5xSTS test < 30 seconds indicating an increase in LE strength and improved balance. Baseline: 10/01/22 34 sec (multiple initial attempts with posterior LOB in chair) 12/20: 49 seconds 11/04/22:1:06, min A on multiple attempts to maintain balance and pt stays in flexed posture throughout test when coming to standing.   9/10: 27.5 sec, Pushes WC post consistently, close CGA  10/22: 47 seconds first attempt 12/5: 1 minute, 37 seconds, multiple attempts to get first stand, but no difficulty after that; 03/20/2023= 1 min 2 sec with 1UE on walker and 1 on armrest- some posterior lean- Close CGA; 06/17/2023= 38 sec with 1 UE on walker and 1 on armrest; 08/21/2023= 35 sec with 1UE on walker and 1 on armrest.  8/5: 36 sec with  1 UE on walker 1 on arm rest; 12/23/2023= 37.05 sec with some posterior LOB- CGA to min A to correct.  01/29/24: 54.77 sec with significant posterior LOB and needing modA to come upright into standing 02/26/24: 46 sec with some posterior LOB on 2 out of 5 stands- min A to recover and stand 03/18/2024= 36 sec from green chair rather than seat of w/c with 1 arm on armrest and the other on walker-  Goal status: ONGOING  3.  Pt will increase to > 0.5 m/s (30 sec) as to improve gait speed for better home ambulation and reduce fall risk. Baseline: 10/01/22 0.096 m/s with RW, close CGA, and WC follow previously 12/27: 0.11 m/s 11/04/22: .056 m/s  9/10: 076 m/s 10/22: 0.64 m/s  11/26: .076 m/s; 03/20/2023= 0.08 m/s; 04/22/2023= 0.089 m/s; 05/15/2023 = 0.079 m/s; 05/27/2023= 0.11 m/s; 07/17/2023= 0.15 m/s; 08/21/2023= 0.14 m/s avg with Bariatric walker; 09/09/2023= 0.14 m/s; 10/02/2023= 0.09 m/s with Bariatric walker and Increased cues to swing RLE through vs. Previous sesisons. 11/13/2023= 0.12 m/s with Bariatric walker; 12/11/2023= 0.14 m/s; 12/23/2023=0.135 m/s 01/29/24: 3 min 25 sec, 0.05 m/s 02/26/2024: 0.14 m/s avg with BRW  04/22/2024:  Goal status: NOT PROGRESSING  4.  Pt will increase hip flexor and abductor  gross strength to 4+/5 bilat as to improve functional strength for independent gait, increased standing tolerance, and increased ADL ability. Baseline: 10/01/22 4+/5, exception hip flexors and abductors 4/5 each bilat  (previously 03/27/22: 4+/5 ) 9/10: Hip flexor 4/5, hip abductor 4+/5 10/22: hip flexor 4/5 hip abductor 4+/5 11/26: hip flexor 4+/5 hip abductor 4+/5 Goal status: MET  5.  Pt will ambulate 150 ft with BRW and WC follow with no rest breaks in less than 12 min to allow for improved mobility within home and increased independence. Baseline: 10/03/22 150' with BRW 04/03/22: 105 ft with BRW 7/29: 100 in 9:30 with pediatric RW 9/10:101 ft with BRW stooped posture worsened throughout  test 12/24/2022-  150 feet using BRW, CGA with w/c follow- in 10 min 17 sec. Will keep goal to make sure he can do this consistently 10/22: 119 ft with BRW 8 min 23 seconds terminated by patient  11/26: 148 ft with BRW 14 min 34 seconds terminated by patient 03/20/2023= 152 feet using BRW, CGA and w/c follow in 14 min 3 sec- Need to keep goal active to ensure consistency but will revised and add a time element to goal. 04/22/2023= 162 feet in 11 min 54 sec using bariatric RW, CGA and close CGA assist; 05/27/2023- 170 feet without rest break using bariatric RW. 06/17/2023= 160 feet with bariatric 4WW in 9 min- stopped due to fatigue.  Goal status: MET   6. Pt will decrease TUG to below 1 min 30 seconds/decrease in order to demonstrate decreased fall risk. Baseline: 06/17/2023= 2 min using bariatric 4WW; 09/09/2023= 1 min 50 sec with bariatric front wheeled walker.  12/02/2023= 1 min 44 sec;  12/23/2023= 2 min 8 sec avg with BRW 01/29/24: 3 min 42 sec with BRW 02/26/24: 1 min 58 sec with BRW and max VC for turning. 03/18/24: 1 min 45 sec with BRW and mod VC for turning Goal status: PROGRESSING   7. Pt will ambulate 300 ft with BRW, CGA and WC follow with no rest breaks in less than 20 min to allow for improved mobility within home/community and increased independence. Baseline: 06/17/2023= 160 feet with bariatric 4WW in 9 min- stopped due to fatigue. 07/17/2023- Patient able to walk 270 feet in > 20 min with BRW, CGA and close w/c follow today- VC for posture and for gait sequencing; 08/21/2023- Not specifically testing but patient has been walking 160 feet or more each Visit; 09/09/2023= Patient ambulated 180 feet overall at one time today using bariatric RW, CGA with VC for erect posture and for increased step length.  10/02/2023= 165 feet with Bariatric RW- close w/c follow and CGA. More diffiuclty with gait sequencing more consistent VC for erect posture.  12/02/2023- Paitent ambulated >100 feet in // bars today  but will reassess using walker next visit.  12/11/2023= Patient ambulated 204 feet with BRW and Close CGA and w/c follow 01/29/24: Patient ambulated 111 feet with BRW and Close CGA and w/c follow (10 min 59 sec) before needing to rest 02/24/2024= Patient ambulated 180 feet with BRW and Close CGA.  03/16/2024= 188 feet with BRW and close CGA Goal Status: PROGRESSING  8. Patient will report ability to walk out his front door including stepping over threshold using his walker with min A of caregiver to enable him to be more independent with household mobility and be able to go out to porch with less dependence on caregivers. Baseline: Patient dependent on caregivers to push him in his wheelchair to go out to porch and reports dependent on them to always assist so he doesn't go outside like he would like to. 01/29/24: Pt reports he has not been able to attempt this due to not having a male at home to assist him. 02/24/2024: Patient reports has been able to step outside on his porch a couple of time with use of walker and close caregiver support.  Goal status: MET  9. Patient will stand at kitchen counter or support bar for > 5 min without UE support to demonstrate improved standing posture/endurance/LE strength with standing ADLs to assist with toileting, changing clothes, or light kitchen activities.  Baseline: Patient able to stand approx 2.5 min without posterior lean and  no UE support.  Goal status: NEW  ASSESSMENT :  CLINICAL IMPRESSION:   The patient demonstrated increased difficulty with functional mobility today, requiring more frequent verbal cues and intermittent minimal assistance for safety. Sit to stand transfers were slower with reduced forward weight shift and occasional need for physical assist, indicating decreased transitional control. Static standing tolerance reached five minutes but was marked by reduced upright posture and intermittent retropulsion, requiring minimal assistance to  maintain stability. Ambulation totaled two hundred sixty nine feet with significantly slowed gait speed, need for a rest break and maximal verbal cues for step length. The patient reported increased fatigue and feeling off, consistent with reduced endurance and gait efficiency compared to baseline. Continued skilled physical therapy remains warranted to address these functional declines, optimize safety and improve mobility.    OBJECTIVE IMPAIRMENTS Abnormal gait, decreased activity tolerance, decreased balance, decreased coordination, decreased endurance, decreased mobility, difficulty walking, decreased ROM, decreased strength, improper body mechanics, postural dysfunction, and obesity.   ACTIVITY LIMITATIONS carrying, lifting, bending, sitting, standing, squatting, stairs, transfers, bed mobility, continence, bathing, toileting, dressing, hygiene/grooming, and locomotion level  PARTICIPATION LIMITATIONS: meal prep, cleaning, laundry, interpersonal relationship, driving, shopping, community activity, and yard work  PERSONAL FACTORS Fitness, Past/current experiences, Time since onset of injury/illness/exacerbation, and 3+ comorbidities: hx of brain tumor, past CVA, HTN, obesity, learning disability, DMII are also affecting patient's functional outcome.   REHAB POTENTIAL: Fair    CLINICAL DECISION MAKING: Stable/uncomplicated  EVALUATION COMPLEXITY: Low  PLAN: PT FREQUENCY: 1-2x/week  PT DURATION: 12 weeks  PLANNED INTERVENTIONS: Therapeutic exercises, Therapeutic activity, Neuromuscular re-education, Balance training, Gait training, Patient/Family education, Joint mobilization, Stair training, Vestibular training, Orthotic/Fit training, DME instructions, Dry Needling, Electrical stimulation, Wheelchair mobility training, Cryotherapy, Moist heat, Compression bandaging, Manual therapy, and Re-evaluation  PLAN FOR NEXT SESSION:  -Gait with 180 deg turn -Retro walking -Gait training with  BWS system as appropriate.  --Continue with functional strengthening exercises of the LE's  -Gait activities using RW. Incorporate proper technique with all transfers to decrease his risk of falling.   Reyes LOISE London PT  Physical Therapist- [facility]   3:50 PM 05/13/24           "

## 2024-05-18 ENCOUNTER — Ambulatory Visit

## 2024-05-20 ENCOUNTER — Ambulatory Visit

## 2024-05-25 ENCOUNTER — Ambulatory Visit

## 2024-05-27 ENCOUNTER — Ambulatory Visit

## 2024-06-01 ENCOUNTER — Ambulatory Visit

## 2024-06-03 ENCOUNTER — Ambulatory Visit

## 2024-06-08 ENCOUNTER — Ambulatory Visit

## 2024-06-10 ENCOUNTER — Ambulatory Visit

## 2024-06-15 ENCOUNTER — Ambulatory Visit

## 2024-06-17 ENCOUNTER — Ambulatory Visit

## 2024-06-22 ENCOUNTER — Ambulatory Visit

## 2024-06-24 ENCOUNTER — Ambulatory Visit

## 2024-06-29 ENCOUNTER — Ambulatory Visit

## 2024-07-01 ENCOUNTER — Ambulatory Visit

## 2024-07-06 ENCOUNTER — Ambulatory Visit

## 2024-07-08 ENCOUNTER — Ambulatory Visit

## 2024-07-13 ENCOUNTER — Ambulatory Visit

## 2024-07-15 ENCOUNTER — Ambulatory Visit

## 2024-07-20 ENCOUNTER — Ambulatory Visit

## 2024-07-22 ENCOUNTER — Ambulatory Visit

## 2024-07-27 ENCOUNTER — Ambulatory Visit

## 2024-07-29 ENCOUNTER — Ambulatory Visit

## 2024-08-03 ENCOUNTER — Ambulatory Visit

## 2024-08-05 ENCOUNTER — Ambulatory Visit

## 2024-08-10 ENCOUNTER — Ambulatory Visit

## 2024-08-12 ENCOUNTER — Ambulatory Visit

## 2024-08-17 ENCOUNTER — Ambulatory Visit
